# Patient Record
Sex: Female | Born: 1945 | Race: Black or African American | Hispanic: No | Marital: Married | Smoking: Never smoker
Health system: Southern US, Community
[De-identification: ages and names within clinical notes are randomized; demographics above are authoritative.]

## PROBLEM LIST (undated history)

## (undated) DIAGNOSIS — I639 Cerebral infarction, unspecified: Secondary | ICD-10-CM

## (undated) DIAGNOSIS — I4891 Unspecified atrial fibrillation: Secondary | ICD-10-CM

## (undated) DIAGNOSIS — I120 Hypertensive chronic kidney disease with stage 5 chronic kidney disease or end stage renal disease: Secondary | ICD-10-CM

## (undated) DIAGNOSIS — Z95828 Presence of other vascular implants and grafts: Secondary | ICD-10-CM

## (undated) DIAGNOSIS — I429 Cardiomyopathy, unspecified: Secondary | ICD-10-CM

## (undated) DIAGNOSIS — Z8619 Personal history of other infectious and parasitic diseases: Secondary | ICD-10-CM

## (undated) DIAGNOSIS — R34 Anuria and oliguria: Secondary | ICD-10-CM

## (undated) DIAGNOSIS — R809 Proteinuria, unspecified: Secondary | ICD-10-CM

## (undated) DIAGNOSIS — A419 Sepsis, unspecified organism: Secondary | ICD-10-CM

## (undated) DIAGNOSIS — I629 Nontraumatic intracranial hemorrhage, unspecified: Secondary | ICD-10-CM

## (undated) DIAGNOSIS — D899 Disorder involving the immune mechanism, unspecified: Secondary | ICD-10-CM

## (undated) DIAGNOSIS — E213 Hyperparathyroidism, unspecified: Secondary | ICD-10-CM

## (undated) DIAGNOSIS — G4733 Obstructive sleep apnea (adult) (pediatric): Secondary | ICD-10-CM

## (undated) DIAGNOSIS — F329 Major depressive disorder, single episode, unspecified: Secondary | ICD-10-CM

## (undated) DIAGNOSIS — M1990 Unspecified osteoarthritis, unspecified site: Secondary | ICD-10-CM

## (undated) DIAGNOSIS — I619 Nontraumatic intracerebral hemorrhage, unspecified: Secondary | ICD-10-CM

## (undated) DIAGNOSIS — I82403 Acute embolism and thrombosis of unspecified deep veins of lower extremity, bilateral: Secondary | ICD-10-CM

## (undated) DIAGNOSIS — Z96641 Presence of right artificial hip joint: Secondary | ICD-10-CM

## (undated) DIAGNOSIS — R601 Generalized edema: Secondary | ICD-10-CM

## (undated) DIAGNOSIS — S52501A Unspecified fracture of the lower end of right radius, initial encounter for closed fracture: Secondary | ICD-10-CM

## (undated) DIAGNOSIS — B259 Cytomegaloviral disease, unspecified: Secondary | ICD-10-CM

## (undated) DIAGNOSIS — L309 Dermatitis, unspecified: Secondary | ICD-10-CM

## (undated) DIAGNOSIS — N186 End stage renal disease: Secondary | ICD-10-CM

## (undated) DIAGNOSIS — L409 Psoriasis, unspecified: Secondary | ICD-10-CM

## (undated) DIAGNOSIS — I480 Paroxysmal atrial fibrillation: Secondary | ICD-10-CM

## (undated) DIAGNOSIS — D649 Anemia, unspecified: Secondary | ICD-10-CM

## (undated) DIAGNOSIS — I420 Dilated cardiomyopathy: Secondary | ICD-10-CM

## (undated) DIAGNOSIS — Z8701 Personal history of pneumonia (recurrent): Secondary | ICD-10-CM

## (undated) DIAGNOSIS — I4892 Unspecified atrial flutter: Secondary | ICD-10-CM

## (undated) DIAGNOSIS — I5031 Acute diastolic (congestive) heart failure: Secondary | ICD-10-CM

## (undated) DIAGNOSIS — M8430XA Stress fracture, unspecified site, initial encounter for fracture: Secondary | ICD-10-CM

## (undated) DIAGNOSIS — Z940 Kidney transplant status: Secondary | ICD-10-CM

## (undated) DIAGNOSIS — J189 Pneumonia, unspecified organism: Secondary | ICD-10-CM

## (undated) DIAGNOSIS — IMO0001 Reserved for inherently not codable concepts without codable children: Secondary | ICD-10-CM

## (undated) DIAGNOSIS — R413 Other amnesia: Secondary | ICD-10-CM

## (undated) DIAGNOSIS — H9190 Unspecified hearing loss, unspecified ear: Secondary | ICD-10-CM

## (undated) DIAGNOSIS — Z992 Dependence on renal dialysis: Secondary | ICD-10-CM

## (undated) DIAGNOSIS — Z9289 Personal history of other medical treatment: Secondary | ICD-10-CM

## (undated) DIAGNOSIS — I509 Heart failure, unspecified: Secondary | ICD-10-CM

## (undated) DIAGNOSIS — K5900 Constipation, unspecified: Secondary | ICD-10-CM

## (undated) DIAGNOSIS — T8189XA Other complications of procedures, not elsewhere classified, initial encounter: Secondary | ICD-10-CM

## (undated) DIAGNOSIS — I739 Peripheral vascular disease, unspecified: Secondary | ICD-10-CM

## (undated) DIAGNOSIS — Z9989 Dependence on other enabling machines and devices: Secondary | ICD-10-CM

## (undated) DIAGNOSIS — J45909 Unspecified asthma, uncomplicated: Secondary | ICD-10-CM

## (undated) DIAGNOSIS — F419 Anxiety disorder, unspecified: Secondary | ICD-10-CM

## (undated) DIAGNOSIS — Z8673 Personal history of transient ischemic attack (TIA), and cerebral infarction without residual deficits: Secondary | ICD-10-CM

## (undated) DIAGNOSIS — E7800 Pure hypercholesterolemia, unspecified: Secondary | ICD-10-CM

## (undated) DIAGNOSIS — E669 Obesity, unspecified: Secondary | ICD-10-CM

## (undated) DIAGNOSIS — F32A Depression, unspecified: Secondary | ICD-10-CM

## (undated) DIAGNOSIS — I10 Essential (primary) hypertension: Secondary | ICD-10-CM

## (undated) DIAGNOSIS — Z95818 Presence of other cardiac implants and grafts: Secondary | ICD-10-CM

## (undated) DIAGNOSIS — N179 Acute kidney failure, unspecified: Secondary | ICD-10-CM

## (undated) DIAGNOSIS — Z5189 Encounter for other specified aftercare: Secondary | ICD-10-CM

## (undated) HISTORY — DX: Unspecified atrial flutter: I48.92

## (undated) HISTORY — DX: Essential (primary) hypertension: I10

## (undated) HISTORY — DX: Stress fracture, unspecified site, initial encounter for fracture: M84.30XA

## (undated) HISTORY — DX: Proteinuria, unspecified: R80.9

## (undated) HISTORY — DX: Acute kidney failure, unspecified: N17.9

## (undated) HISTORY — DX: Nontraumatic intracranial hemorrhage, unspecified: I62.9

## (undated) HISTORY — DX: Acute diastolic (congestive) heart failure: I50.31

## (undated) HISTORY — DX: Cardiomyopathy, unspecified: I42.9

## (undated) HISTORY — PX: CARDIAC CATHETERIZATION: SHX172

## (undated) HISTORY — DX: Other complications of procedures, not elsewhere classified, initial encounter: T81.89XA

## (undated) HISTORY — DX: Presence of other cardiac implants and grafts: Z95.818

## (undated) HISTORY — DX: Other amnesia: R41.3

## (undated) HISTORY — PX: JOINT REPLACEMENT: SHX530

## (undated) HISTORY — DX: Dilated cardiomyopathy: I42.0

## (undated) HISTORY — DX: Unspecified atrial fibrillation: I48.91

## (undated) HISTORY — DX: Kidney transplant status: Z94.0

## (undated) HISTORY — DX: Hyperparathyroidism, unspecified: E21.3

## (undated) HISTORY — DX: Sepsis, unspecified organism: A41.9

## (undated) HISTORY — DX: Psoriasis, unspecified: L40.9

## (undated) HISTORY — DX: Major depressive disorder, single episode, unspecified: F32.9

## (undated) HISTORY — DX: Cytomegaloviral disease, unspecified: B25.9

## (undated) HISTORY — DX: End stage renal disease: N18.6

## (undated) HISTORY — PX: FEMUR FRACTURE SURGERY: SHX633

## (undated) HISTORY — DX: Personal history of other infectious and parasitic diseases: Z86.19

## (undated) HISTORY — DX: Morbid (severe) obesity due to excess calories: E66.01

## (undated) HISTORY — DX: Obesity, unspecified: E66.9

## (undated) HISTORY — DX: Presence of right artificial hip joint: Z96.641

## (undated) HISTORY — PX: FRACTURE SURGERY: SHX138

## (undated) HISTORY — DX: Presence of other vascular implants and grafts: Z95.828

## (undated) HISTORY — DX: Hypertensive chronic kidney disease with stage 5 chronic kidney disease or end stage renal disease: I12.0

## (undated) HISTORY — PX: CHOLECYSTECTOMY: SHX55

## (undated) HISTORY — DX: Personal history of pneumonia (recurrent): Z87.01

## (undated) HISTORY — DX: Unspecified asthma, uncomplicated: J45.909

## (undated) HISTORY — DX: Personal history of transient ischemic attack (TIA), and cerebral infarction without residual deficits: Z86.73

## (undated) HISTORY — DX: Unspecified hearing loss, unspecified ear: H91.90

## (undated) HISTORY — PX: CYSTOSCOPY: SUR368

## (undated) HISTORY — PX: TUBAL LIGATION: SHX77

## (undated) HISTORY — DX: Other disorders of phosphorus metabolism: E83.39

## (undated) HISTORY — DX: Paroxysmal atrial fibrillation: I48.0

## (undated) HISTORY — PX: COLONOSCOPY: SHX174

## (undated) HISTORY — DX: Generalized edema: R60.1

## (undated) HISTORY — DX: Personal history of other medical treatment: Z92.89

## (undated) HISTORY — DX: Dependence on other enabling machines and devices: Z99.89

## (undated) HISTORY — PX: INSERTION OF DIALYSIS CATHETER: SHX1324

## (undated) HISTORY — DX: Disorder involving the immune mechanism, unspecified: D89.9

## (undated) HISTORY — DX: Hypomagnesemia: E83.42

## (undated) HISTORY — DX: Depression, unspecified: F32.A

---
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
## 1998-04-18 ENCOUNTER — Ambulatory Visit (HOSPITAL_COMMUNITY): Admission: RE | Admit: 1998-04-18 | Discharge: 1998-04-18 | Payer: Self-pay | Admitting: Obstetrics and Gynecology

## 1999-04-20 ENCOUNTER — Encounter: Payer: Self-pay | Admitting: Family Medicine

## 1999-04-20 ENCOUNTER — Ambulatory Visit (HOSPITAL_COMMUNITY): Admission: RE | Admit: 1999-04-20 | Discharge: 1999-04-20 | Payer: Self-pay | Admitting: Family Medicine

## 2000-04-23 ENCOUNTER — Encounter: Payer: Self-pay | Admitting: Family Medicine

## 2000-04-23 ENCOUNTER — Ambulatory Visit (HOSPITAL_COMMUNITY): Admission: RE | Admit: 2000-04-23 | Discharge: 2000-04-23 | Payer: Self-pay | Admitting: Family Medicine

## 2001-05-07 ENCOUNTER — Ambulatory Visit (HOSPITAL_COMMUNITY): Admission: RE | Admit: 2001-05-07 | Discharge: 2001-05-07 | Payer: Self-pay | Admitting: Family Medicine

## 2001-05-07 ENCOUNTER — Encounter: Payer: Self-pay | Admitting: Family Medicine

## 2002-05-08 ENCOUNTER — Ambulatory Visit (HOSPITAL_COMMUNITY): Admission: RE | Admit: 2002-05-08 | Discharge: 2002-05-08 | Payer: Self-pay | Admitting: Obstetrics and Gynecology

## 2002-05-08 ENCOUNTER — Encounter: Payer: Self-pay | Admitting: Obstetrics and Gynecology

## 2002-09-10 ENCOUNTER — Encounter (INDEPENDENT_AMBULATORY_CARE_PROVIDER_SITE_OTHER): Payer: Self-pay | Admitting: Specialist

## 2002-09-10 ENCOUNTER — Ambulatory Visit (HOSPITAL_BASED_OUTPATIENT_CLINIC_OR_DEPARTMENT_OTHER): Admission: RE | Admit: 2002-09-10 | Discharge: 2002-09-10 | Payer: Self-pay | Admitting: Plastic Surgery

## 2003-07-17 DIAGNOSIS — I509 Heart failure, unspecified: Secondary | ICD-10-CM

## 2003-07-17 HISTORY — DX: Heart failure, unspecified: I50.9

## 2003-09-15 ENCOUNTER — Ambulatory Visit (HOSPITAL_COMMUNITY): Admission: RE | Admit: 2003-09-15 | Discharge: 2003-09-15 | Payer: Self-pay | Admitting: Obstetrics and Gynecology

## 2003-11-08 ENCOUNTER — Other Ambulatory Visit: Admission: RE | Admit: 2003-11-08 | Discharge: 2003-11-08 | Payer: Self-pay | Admitting: Obstetrics and Gynecology

## 2004-10-26 ENCOUNTER — Ambulatory Visit (HOSPITAL_COMMUNITY): Admission: RE | Admit: 2004-10-26 | Discharge: 2004-10-26 | Payer: Self-pay | Admitting: Family Medicine

## 2004-12-18 ENCOUNTER — Encounter: Admission: RE | Admit: 2004-12-18 | Discharge: 2004-12-18 | Payer: Self-pay | Admitting: Cardiology

## 2004-12-18 IMAGING — CR DG CHEST 2V
2 series · 2 of 2 positions shown · non-contrast
Comparison: None.

CLINICAL DATA: Shortness of breath.  Preprocedure chest x-ray.
 TWO VIEW CHEST ? [DATE]:

[w chest pa]
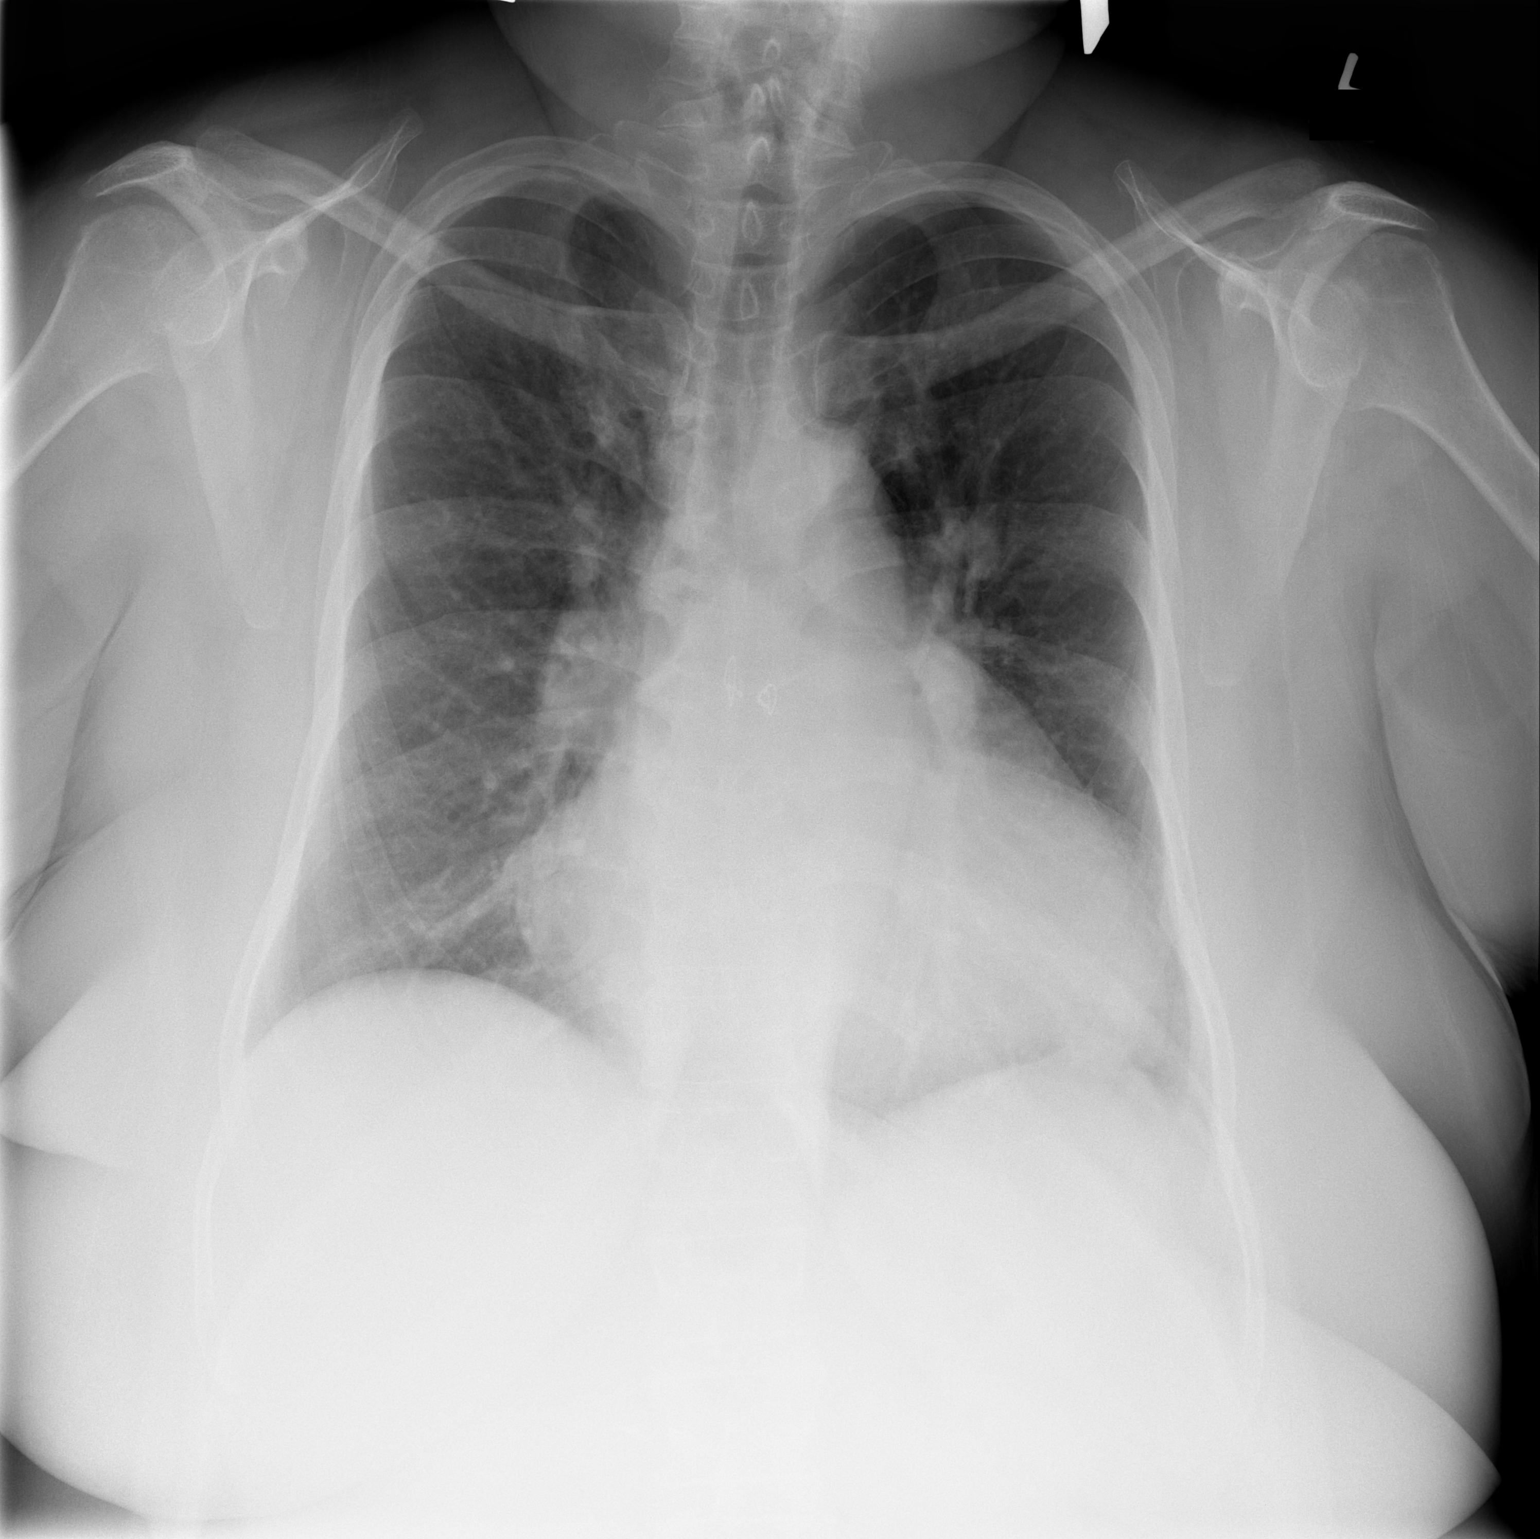

[w chest lat]
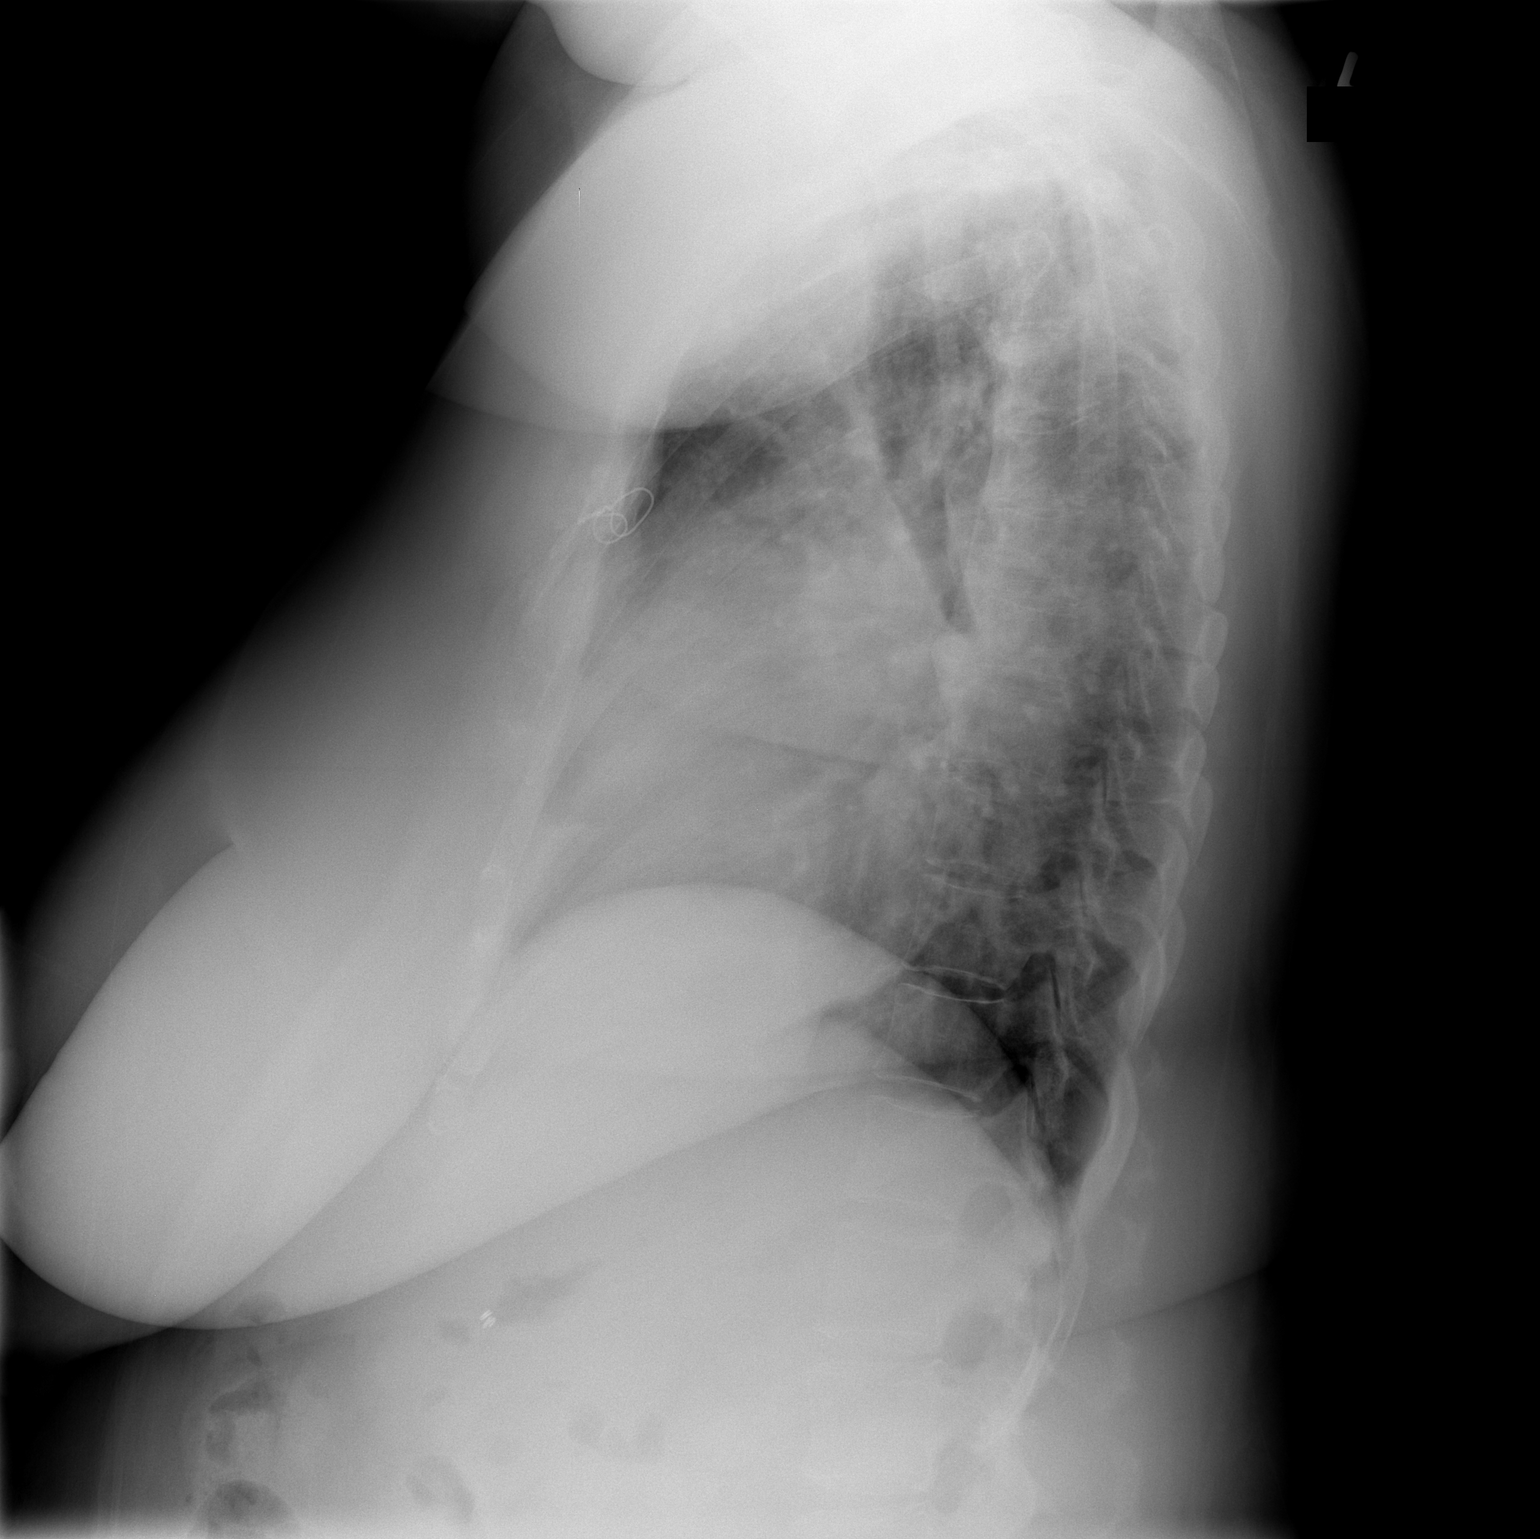

[2 of 2 positions shown; findings below may reference images not displayed]

The cardiopericardial silhouette is enlarged with associated pulmonary vascular congestion.  Minimal basilar atelectasis noted bilaterally.  No pleural effusion.  Bony structures of the imaged thorax are intact with two cerclage wires projecting over the sternum.
IMPRESSION: Cardiomegaly with pulmonary vascular congestion.

## 2004-12-21 ENCOUNTER — Ambulatory Visit (HOSPITAL_COMMUNITY): Admission: RE | Admit: 2004-12-21 | Discharge: 2004-12-21 | Payer: Self-pay | Admitting: Cardiology

## 2004-12-28 ENCOUNTER — Ambulatory Visit: Payer: Self-pay | Admitting: Pulmonary Disease

## 2005-04-03 ENCOUNTER — Ambulatory Visit: Payer: Self-pay | Admitting: Pulmonary Disease

## 2005-06-21 ENCOUNTER — Encounter: Admission: RE | Admit: 2005-06-21 | Discharge: 2005-06-21 | Payer: Self-pay | Admitting: Cardiology

## 2005-09-11 ENCOUNTER — Ambulatory Visit: Payer: Self-pay | Admitting: Pulmonary Disease

## 2005-10-26 ENCOUNTER — Ambulatory Visit: Payer: Self-pay | Admitting: Pulmonary Disease

## 2005-11-06 ENCOUNTER — Ambulatory Visit (HOSPITAL_COMMUNITY): Admission: RE | Admit: 2005-11-06 | Discharge: 2005-11-06 | Payer: Self-pay | Admitting: Family Medicine

## 2006-04-15 ENCOUNTER — Encounter: Admission: RE | Admit: 2006-04-15 | Discharge: 2006-04-15 | Payer: Self-pay | Admitting: Family Medicine

## 2006-04-15 IMAGING — CR DG CHEST 2V
2 series · 2 of 2 positions shown · non-contrast
Comparison: [DATE].

CLINICAL DATA: Cough and wheezing for two days.  Nonsmoker.
TWO VIEW CHEST:

[w chest pa *]
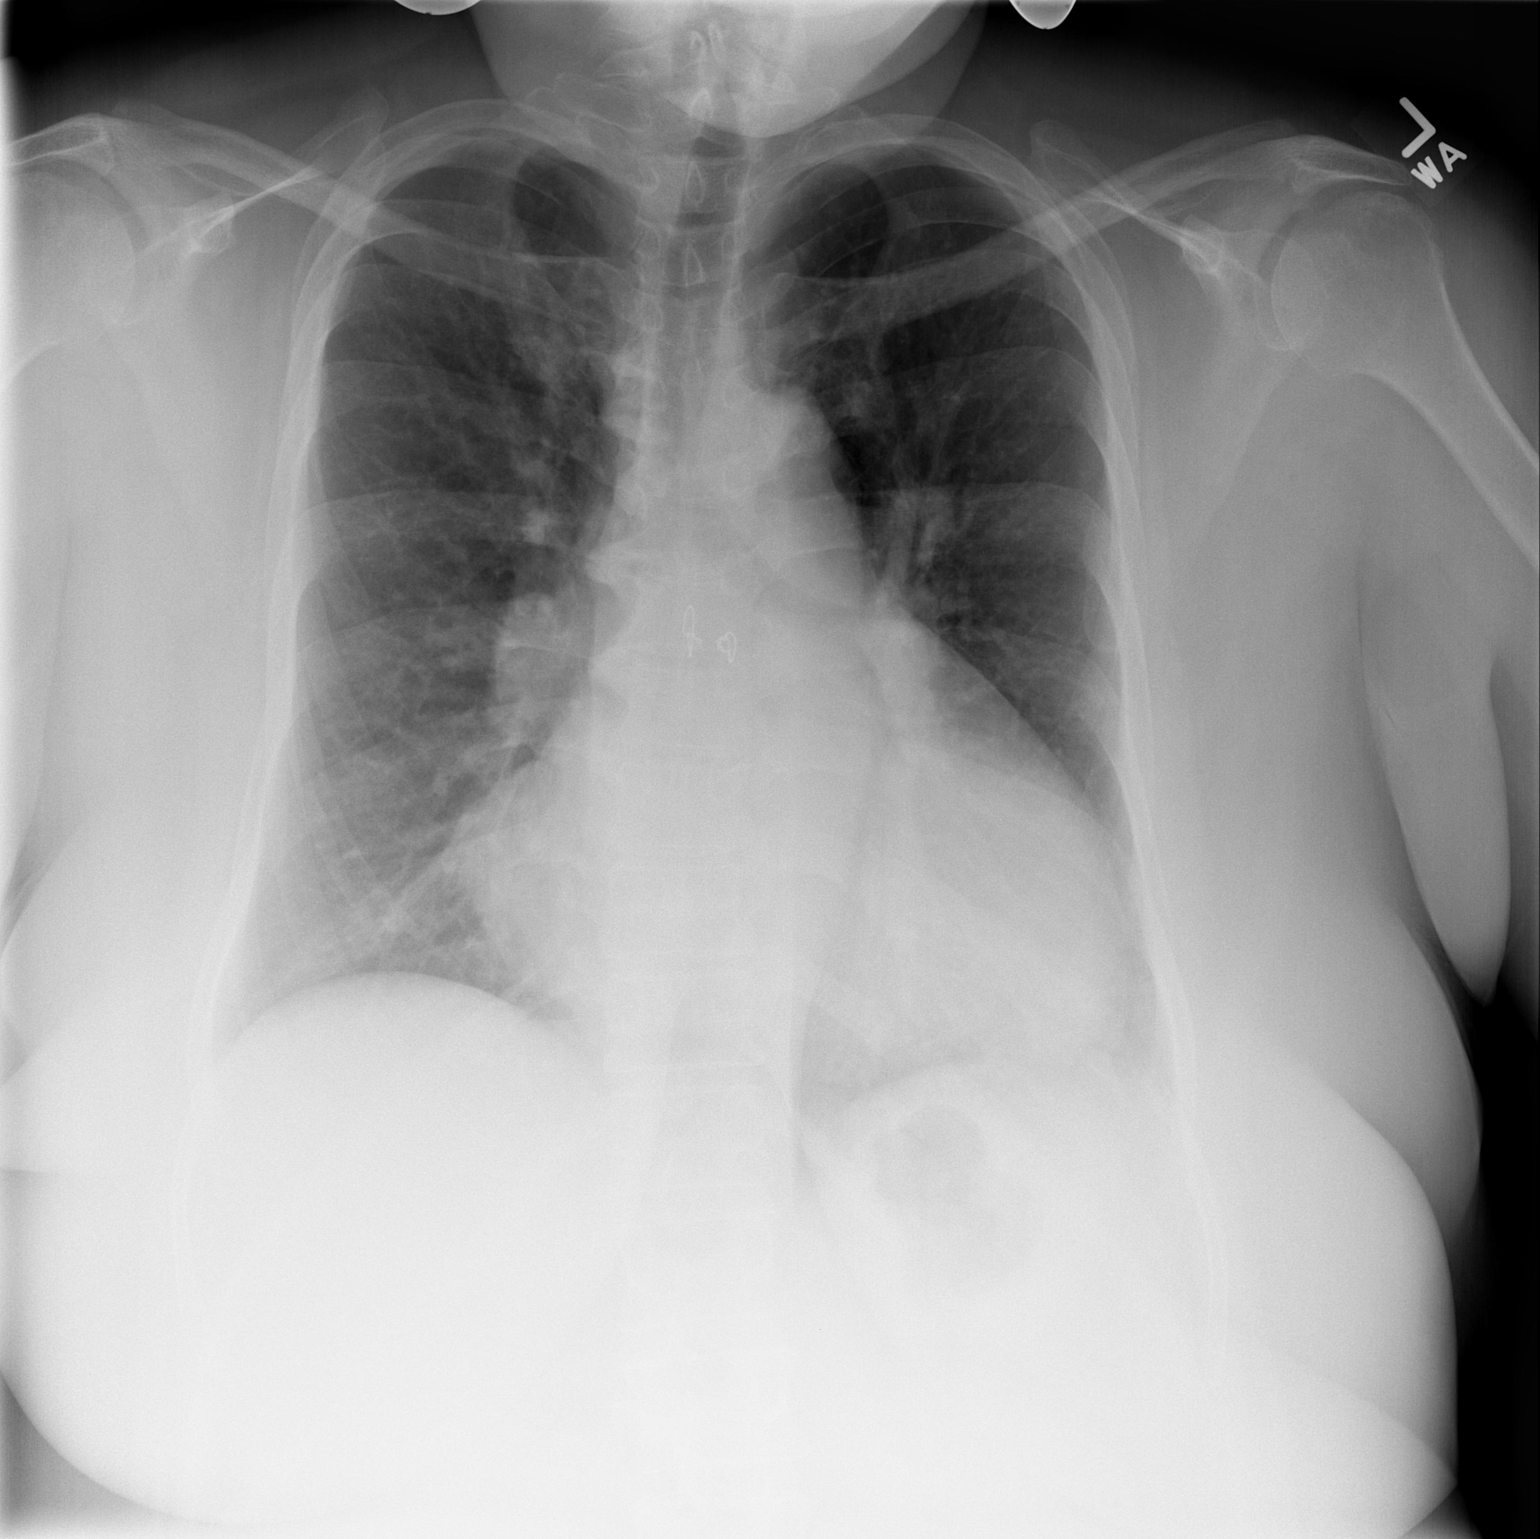

[w chest lat *]
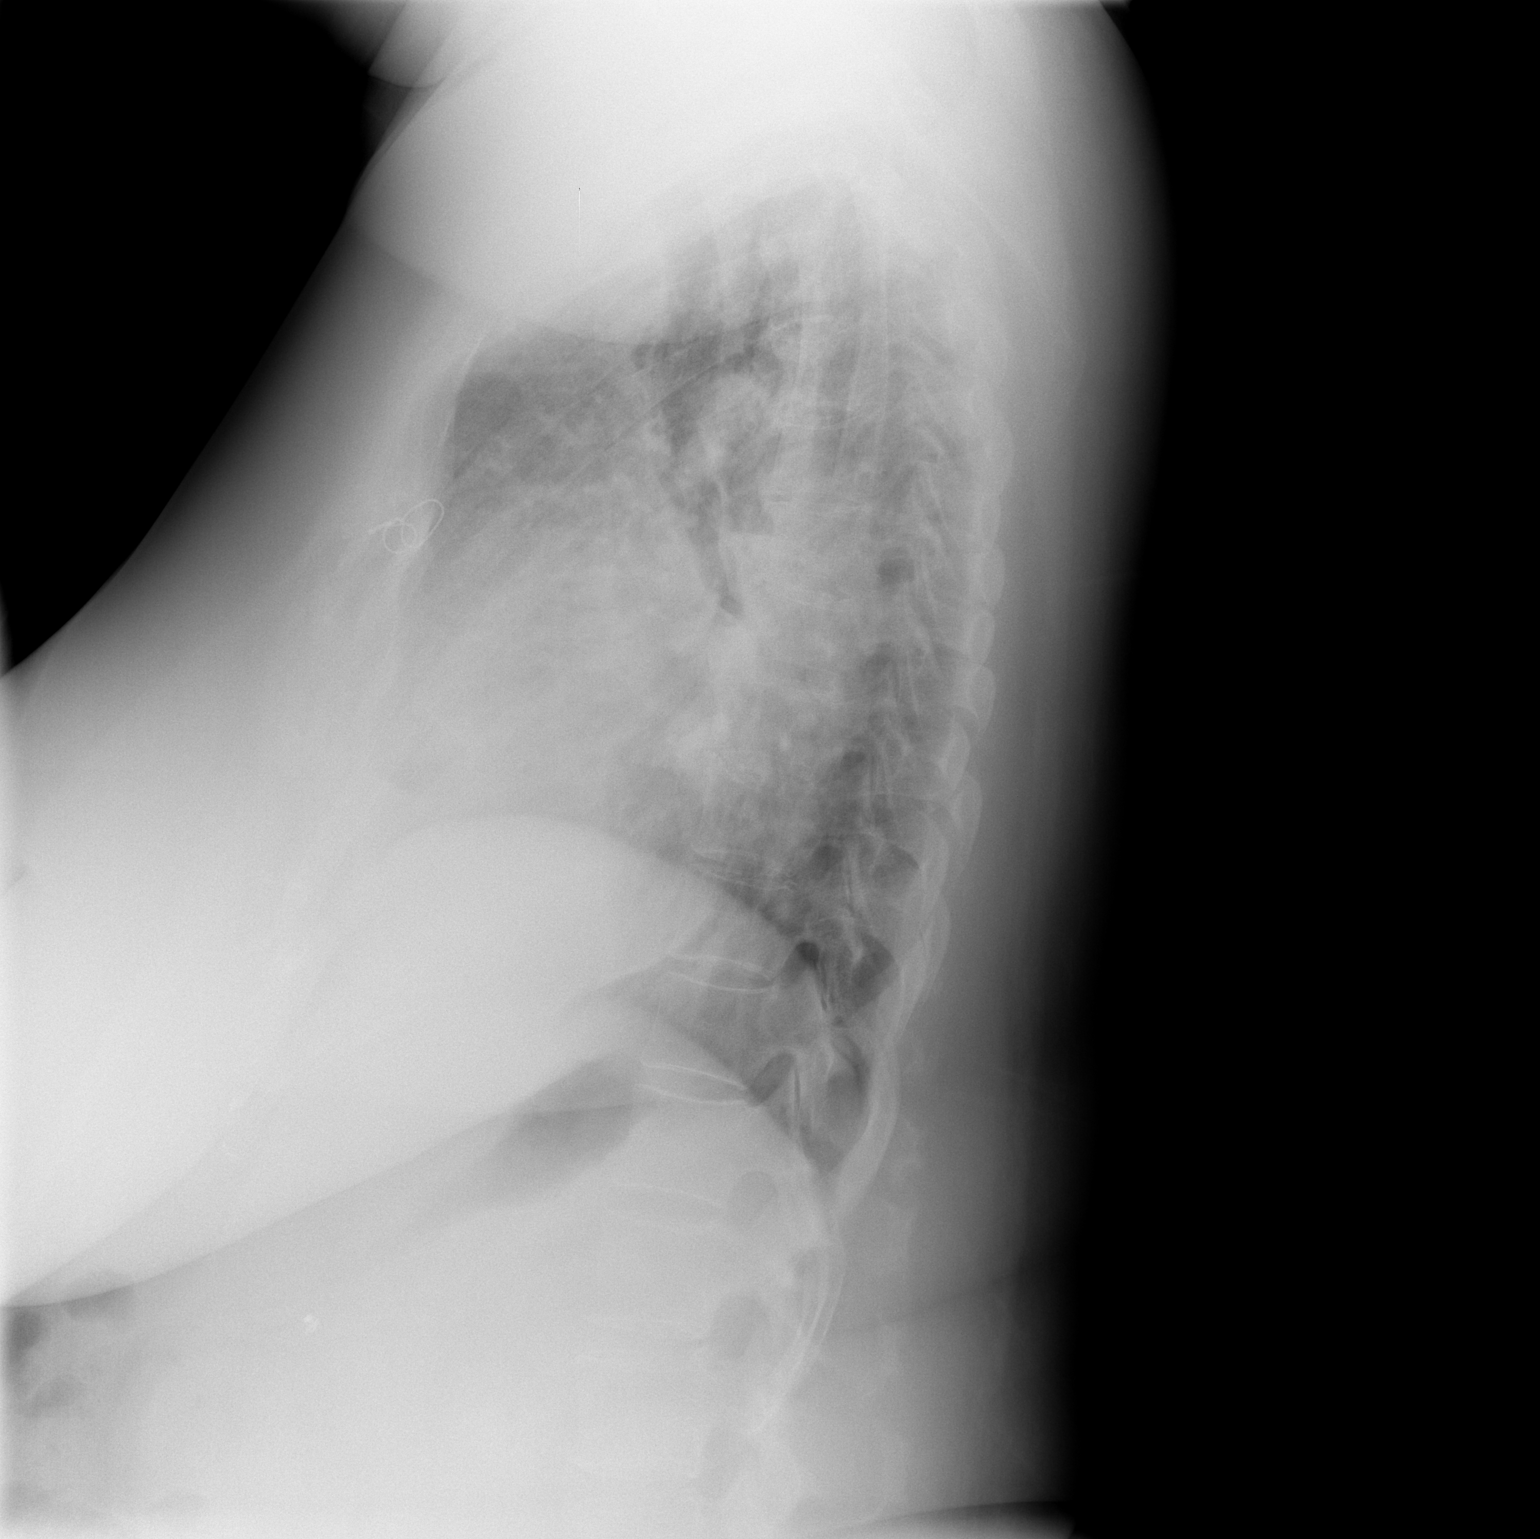

[2 of 2 positions shown; findings below may reference images not displayed]

FINDINGS: Midline trachea.  Global cardiomegaly.  Component of left atrial enlargement possibly right atrial enlargement.  Costophrenic angles are sharp.  There is mild pulmonary vascular congestion.  Poor delineation of the right heart border is similar to prior exam and likely within normal variation for this patient.
IMPRESSION: 1.  Similar global cardiomegaly with probable biatrial enlargement. 
2.  Mild pulmonary vascular congestion.

## 2006-06-25 ENCOUNTER — Ambulatory Visit: Payer: Self-pay | Admitting: Pulmonary Disease

## 2006-07-16 HISTORY — PX: LAPAROSCOPIC GASTRIC BANDING: SHX1100

## 2006-07-23 ENCOUNTER — Ambulatory Visit: Payer: Self-pay | Admitting: Critical Care Medicine

## 2006-07-23 ENCOUNTER — Inpatient Hospital Stay (HOSPITAL_COMMUNITY): Admission: EM | Admit: 2006-07-23 | Discharge: 2006-07-28 | Payer: Self-pay | Admitting: Emergency Medicine

## 2006-07-23 IMAGING — CR DG CHEST 1V PORT
1 series · 1 of 1 positions shown · non-contrast
Comparison: [DATE].

CLINICAL DATA: Irregular heartbeat. Shortness of breath. Hypertension.
 PORTABLE CHEST - 1 VIEW ([L1] hours):

[view not recorded]
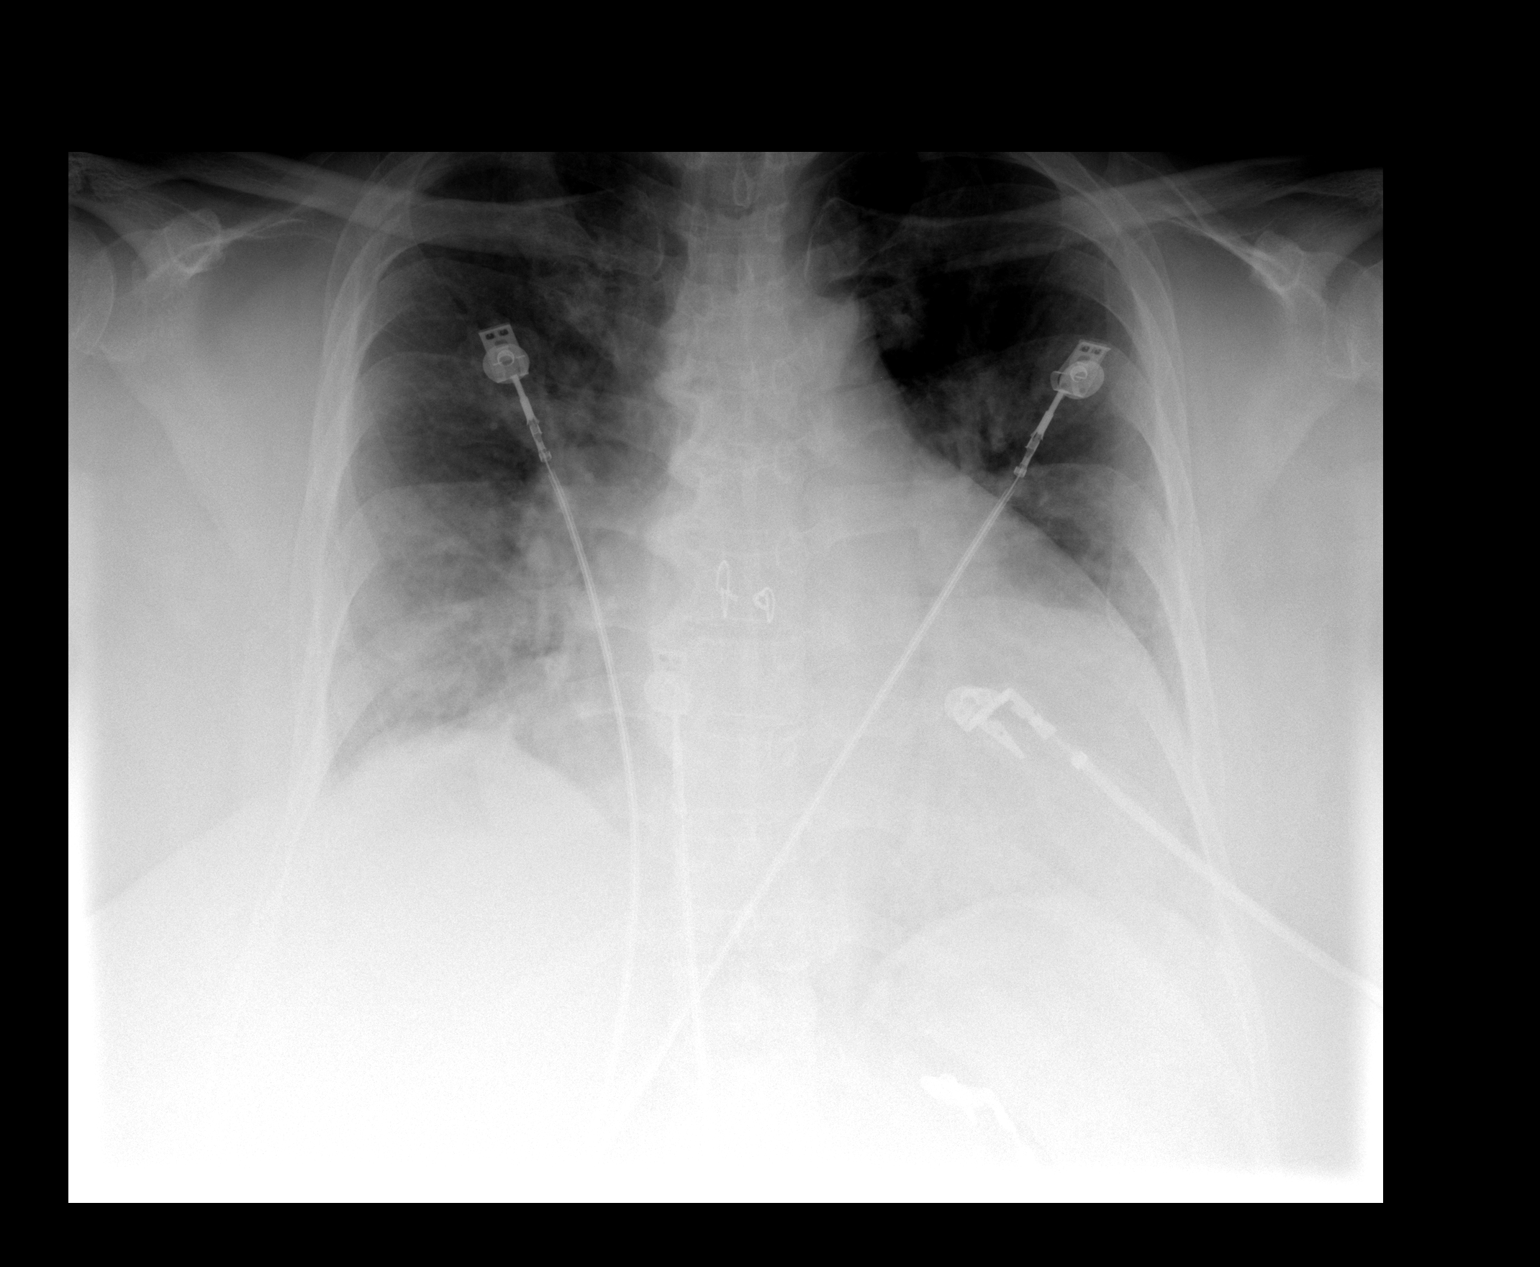

[1 of 1 positions shown; findings below may reference images not displayed]

FINDINGS: Cardiomegaly, vascular congestion and pulmonary edema.
IMPRESSION: CHF.

## 2006-07-23 IMAGING — CT CT ANGIO ABDOMEN
2 of 9 series · 14 of 46 positions shown, 19 images · IV contrast (APPLIED)
Comparison: none

CLINICAL DATA: Congestive heart failure.
 CT ANGIOGRAPHY OF ABDOMEN:
TECHNIQUE: Multidetector CT imaging of the abdomen was performed during bolus injection of intravenous contrast.  Multiplanar CT angiographic image reconstructions were generated to evaluate the vascular anatomy.
 Contrast:  100 cc Omnipaque 350.

[Series 5: abd cta 2.0 (id) · axial · 0.72mm/px · z∈[-256,-18]mm · 11 of 141 slices shown, 16 images]
[im 11/141  soft-tissue]
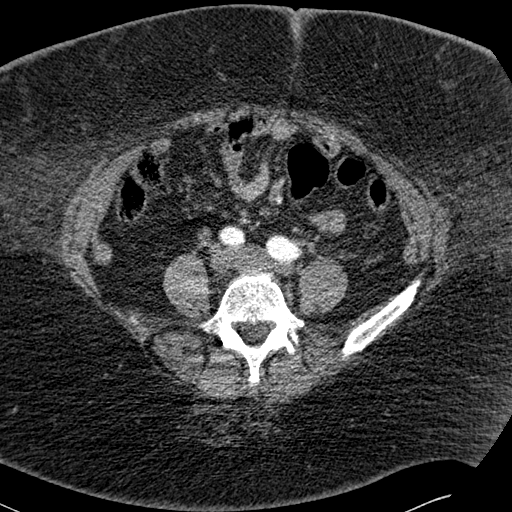
[im 11/141  bone]
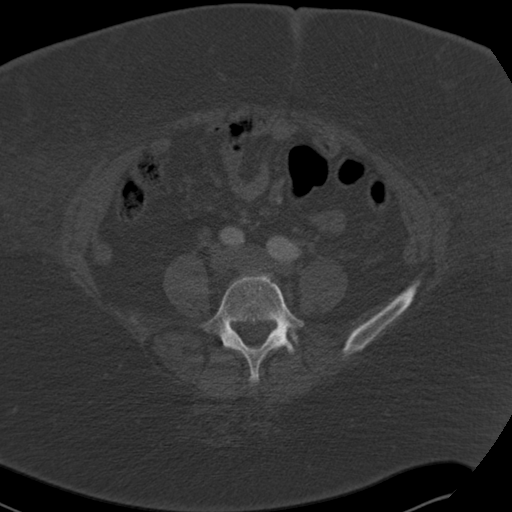
[im 22/141  soft-tissue]
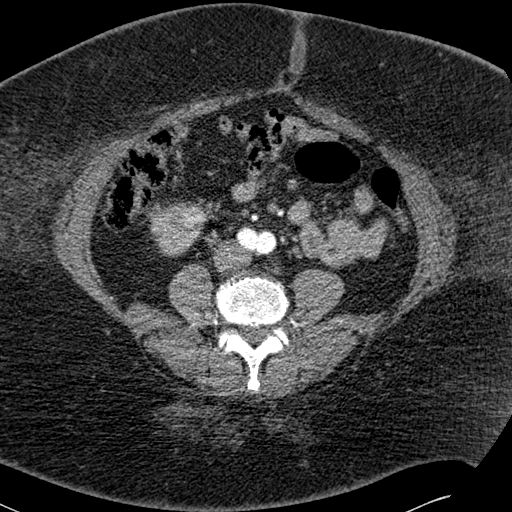
[im 44/141  soft-tissue]
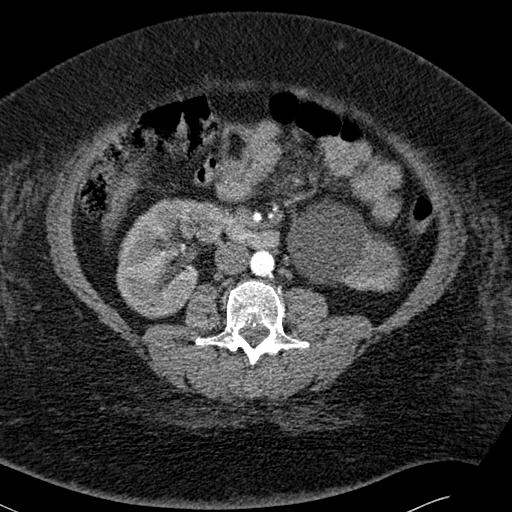
[im 54/141  soft-tissue]
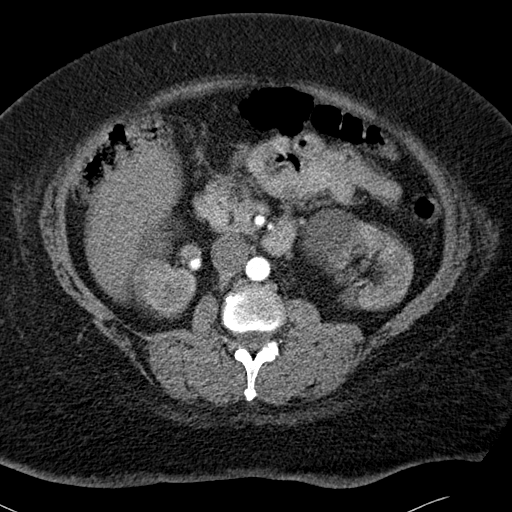
[im 65/141  soft-tissue]
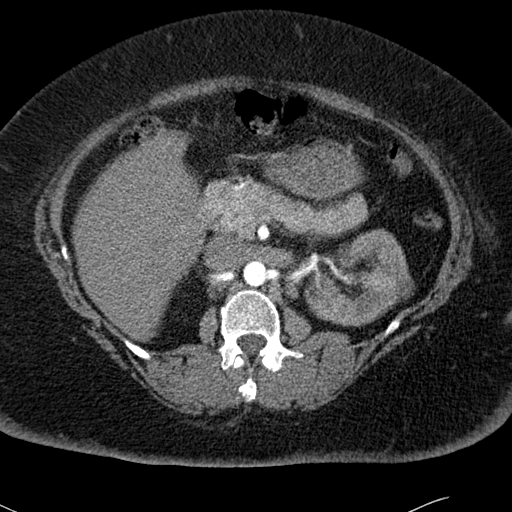
[im 76/141  soft-tissue]
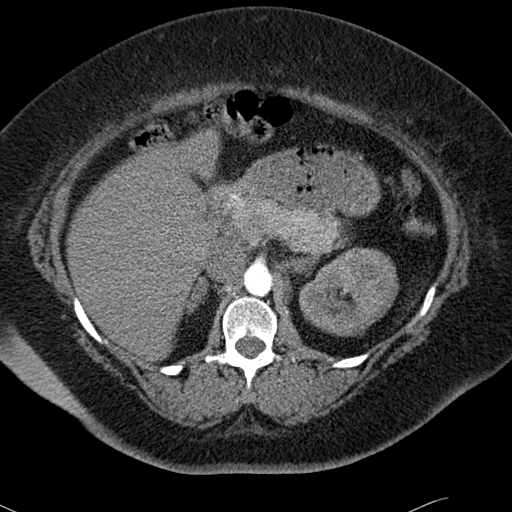
[im 87/141  soft-tissue]
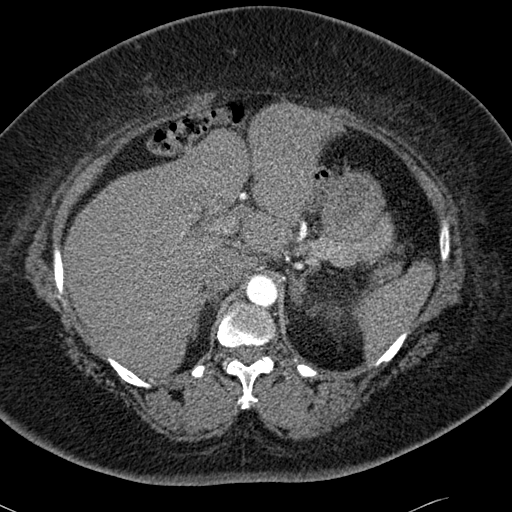
[im 97/141  lung]
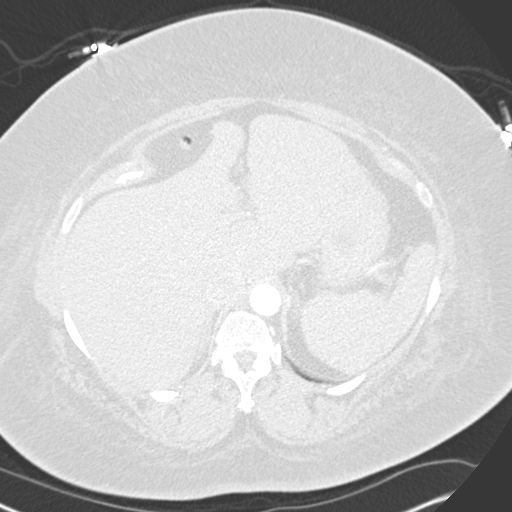
[im 108/141  soft-tissue]
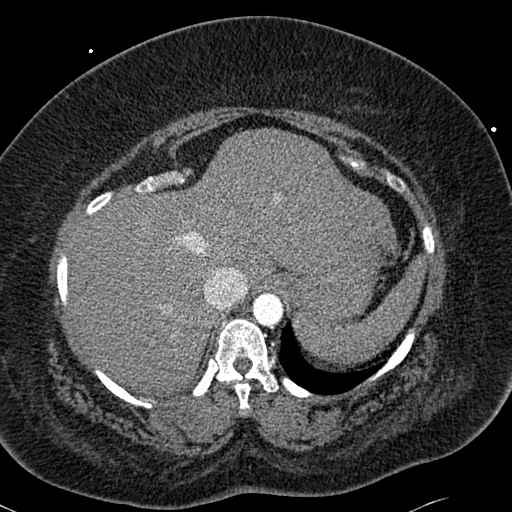
[im 108/141  lung]
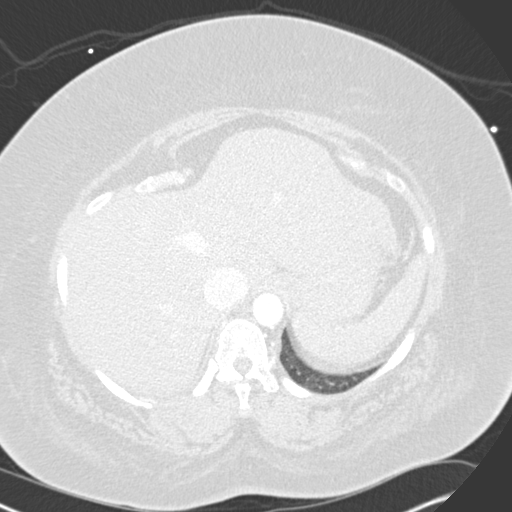
[im 119/141  soft-tissue]
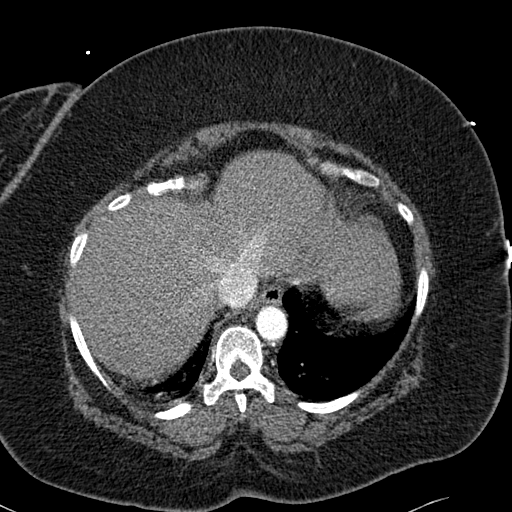
[im 119/141  lung]
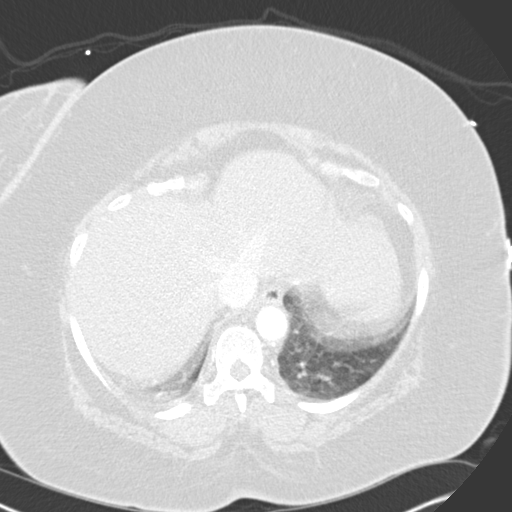
[im 119/141  bone]
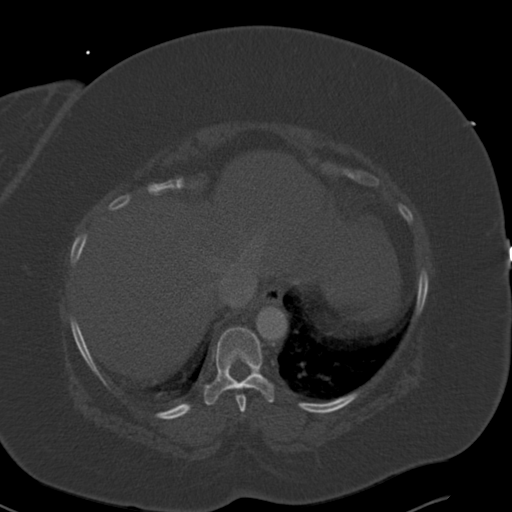
[im 130/141  soft-tissue]
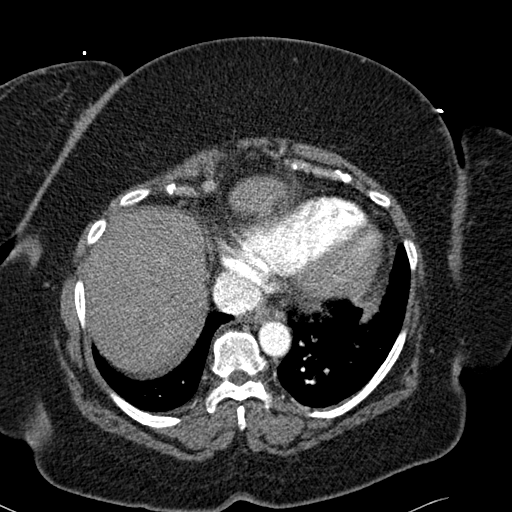
[im 130/141  lung]
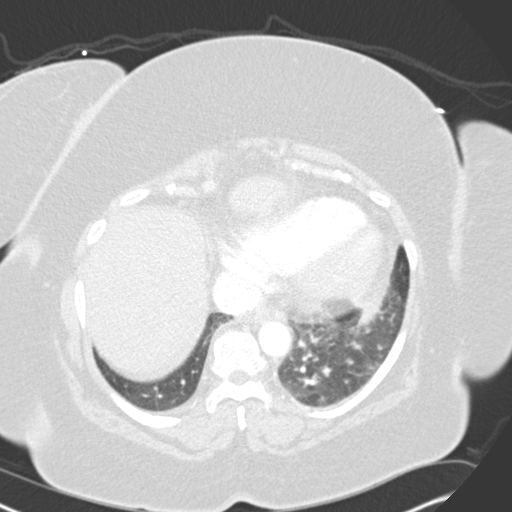

[Series 9: abd cta 2.0 spo corst · coronal · 0.72mm/px · 3 of 136 slices shown]
[im 34/136  soft-tissue]
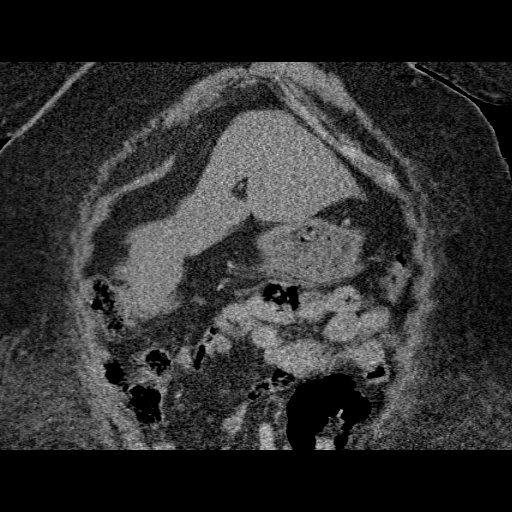
[im 68/136  soft-tissue]
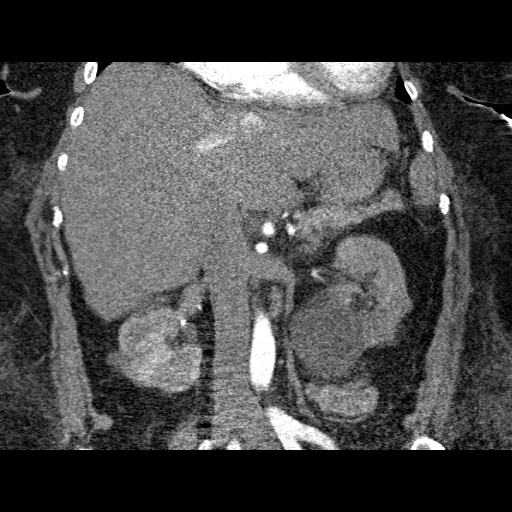
[im 102/136  soft-tissue]
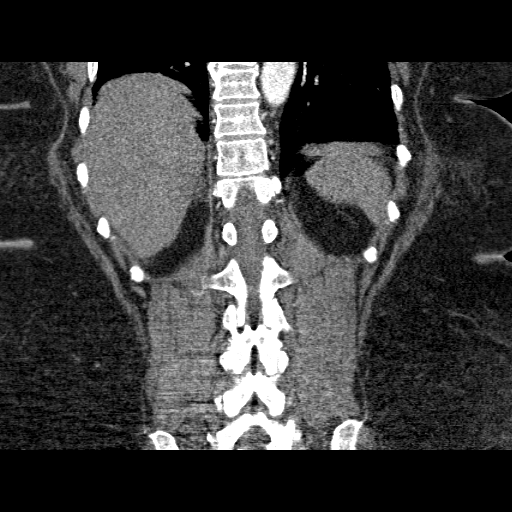

[14 of 46 positions shown; findings below may reference images not displayed]

FINDINGS: The patient is severely obese limiting the exam.  
 Single renal arteries are patent.  The SMA, celiac, and IMA are patent.  The proximal iliac arteries are patent.  The aorta is nonaneurysmal and patent.  
 Post cholecystectomy is noted.  Bibasilar atelectasis is present.  Small bilateral adrenal adenomas are present.  Multiple renal hypodensities are seen bilaterally.  A number of them are too small to characterize.  The larger ones are characterized as simple cysts.  
 The pancreas, and spleen are within normal limits.  The liver is lobulated with diffuse fatty infiltration.  There is no focal liver mass.  No abnormal adenopathy or free fluid is present.  Degenerative changes are present in the spine.  The heart is markedly enlarged.
IMPRESSION: 1.  Cardiomegaly.
 2.  No renal artery stenosis. 
 3.  Bilateral adrenal adenomas.
 4.  Renal hypodensities.  Ultrasound is warranted to characterize the small ones.

## 2006-07-24 ENCOUNTER — Encounter (INDEPENDENT_AMBULATORY_CARE_PROVIDER_SITE_OTHER): Payer: Self-pay | Admitting: Cardiology

## 2006-08-01 ENCOUNTER — Ambulatory Visit: Payer: Self-pay | Admitting: Pulmonary Disease

## 2006-08-16 ENCOUNTER — Ambulatory Visit (HOSPITAL_COMMUNITY): Admission: RE | Admit: 2006-08-16 | Discharge: 2006-08-16 | Payer: Self-pay | Admitting: Cardiology

## 2006-08-16 IMAGING — CR DG CHEST 2V
2 series · 2 of 2 positions shown · non-contrast
Comparison: Portable examination [DATE].

CLINICAL DATA: Dyspnea. Asthma and CHF. Pre-cardioversion for atrial fibrillation.
 CHEST - 2 VIEW:

[view not recorded (1 of 2)]
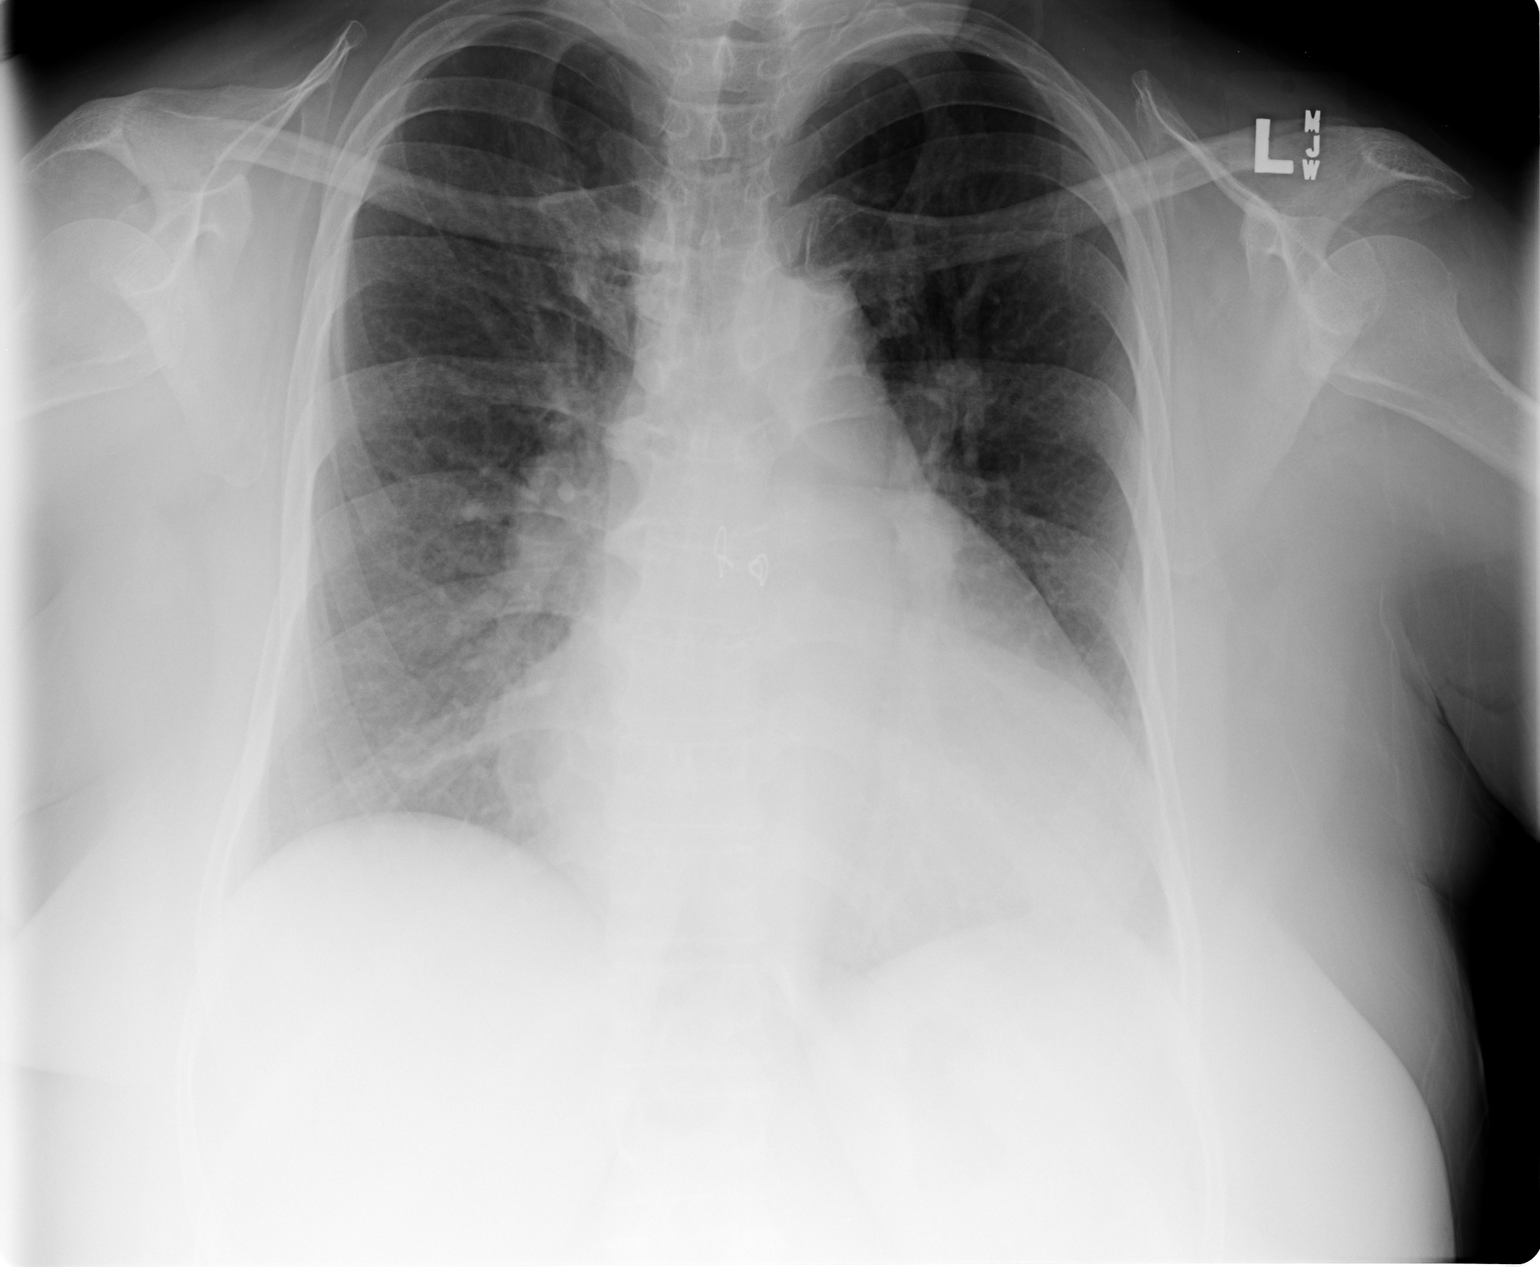

[view not recorded (2 of 2)]
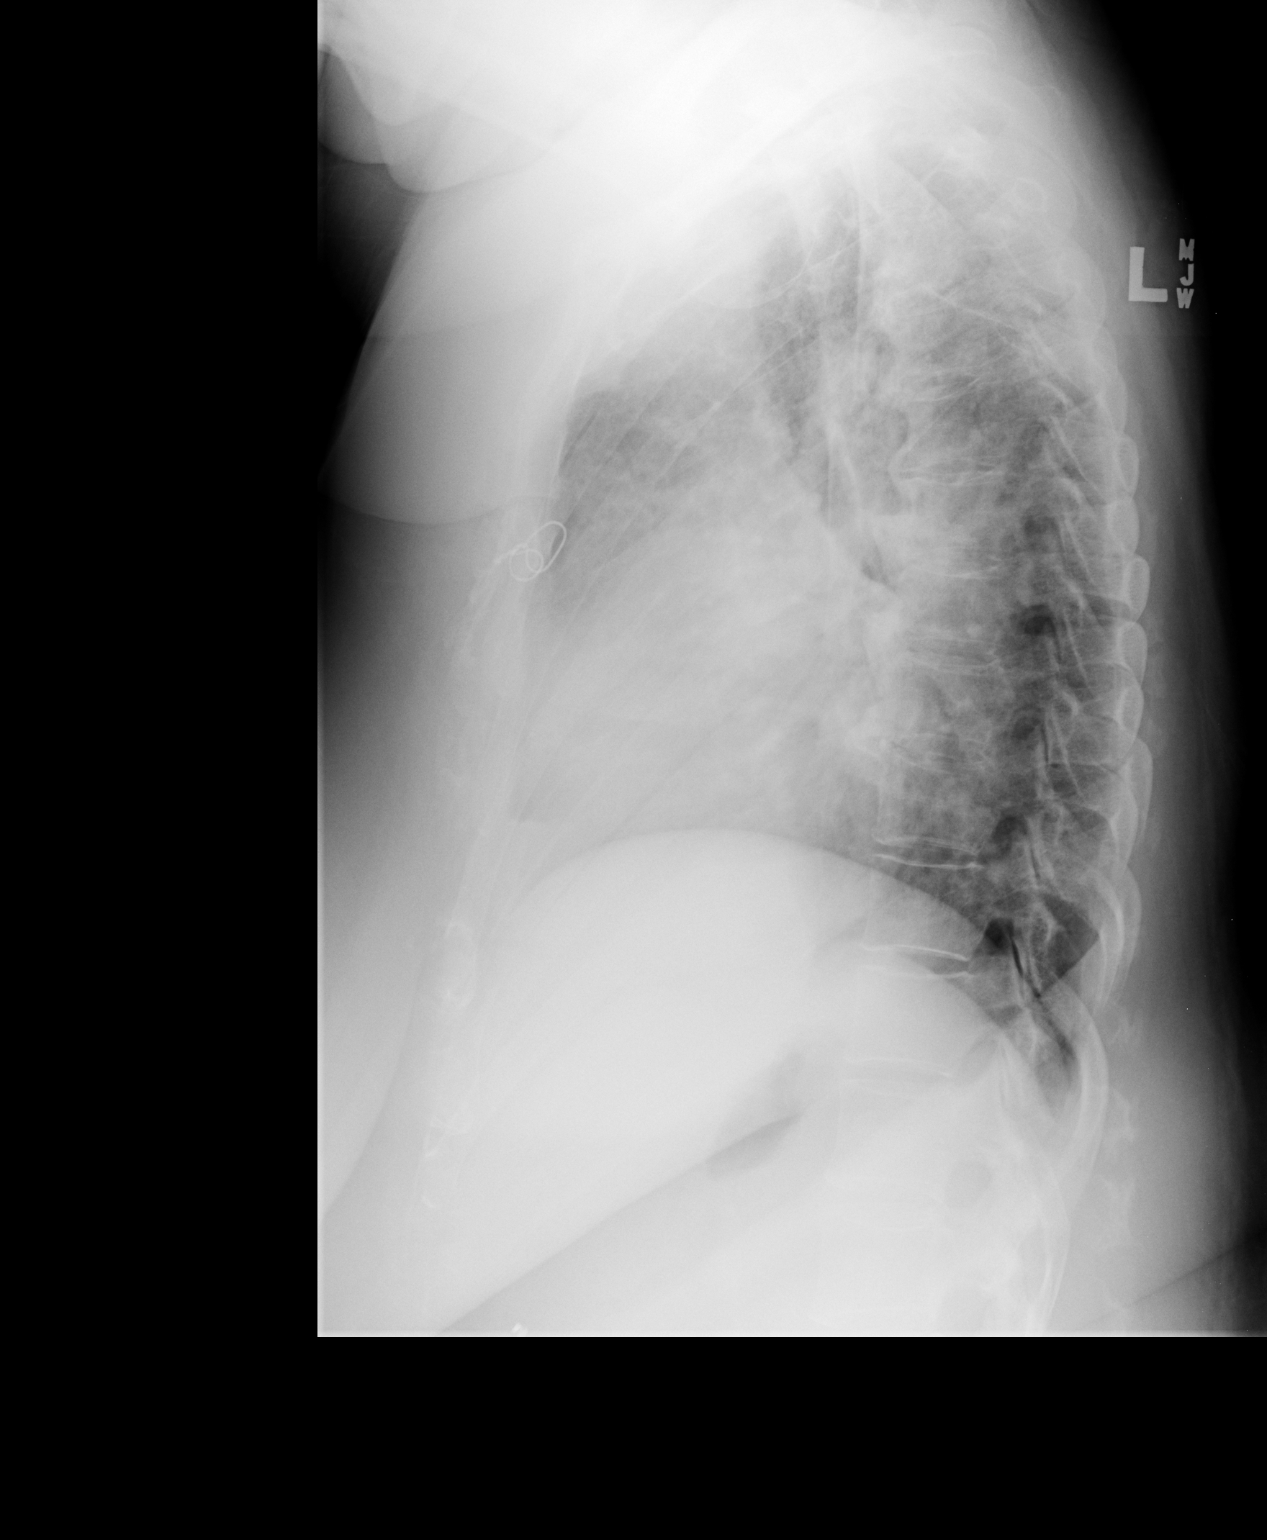

[2 of 2 positions shown; findings below may reference images not displayed]

FINDINGS: There is stable cardiomegaly and vascular congestion. The pulmonary edema has resolved and there is improved aeration of the lung bases. There is no pleural effusion or pneumothorax. Median sternotomy changes are noted.
IMPRESSION: Interval resolution of edema and bibasilar atelectasis. Cardiomegaly and vascular congestion remain.

## 2006-09-03 ENCOUNTER — Encounter: Admission: RE | Admit: 2006-09-03 | Discharge: 2006-09-03 | Payer: Self-pay | Admitting: Nephrology

## 2006-09-03 IMAGING — US US RENAL
1 series · 14 of 25 positions shown · non-contrast
Comparison: none

CLINICAL DATA: Elevated creatinine.  Evaluate kidney size and possible obstruction.
 RENAL/URINARY TRACT ULTRASOUND:
TECHNIQUE: Complete ultrasound examination of the urinary tract was performed including evaluation of the kidneys, renal collecting systems, and urinary bladder.

[Series 1: unknown · 0.28mm/px · 14 of 46 slices shown]
[im 1/46]
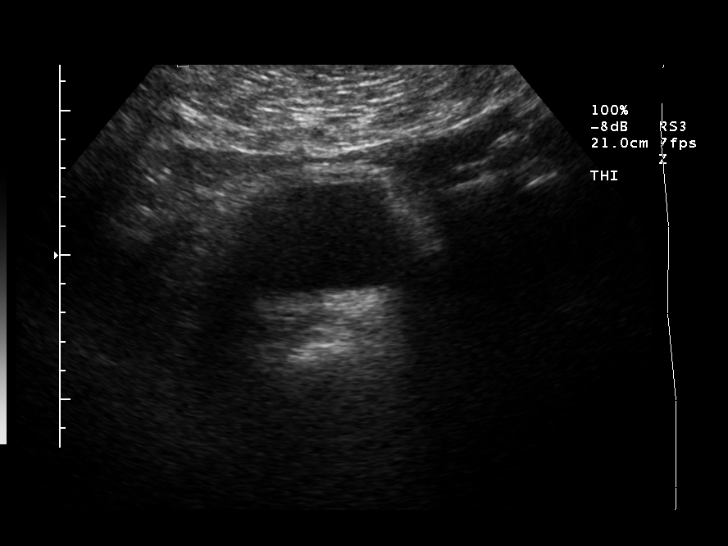
[im 4/46]
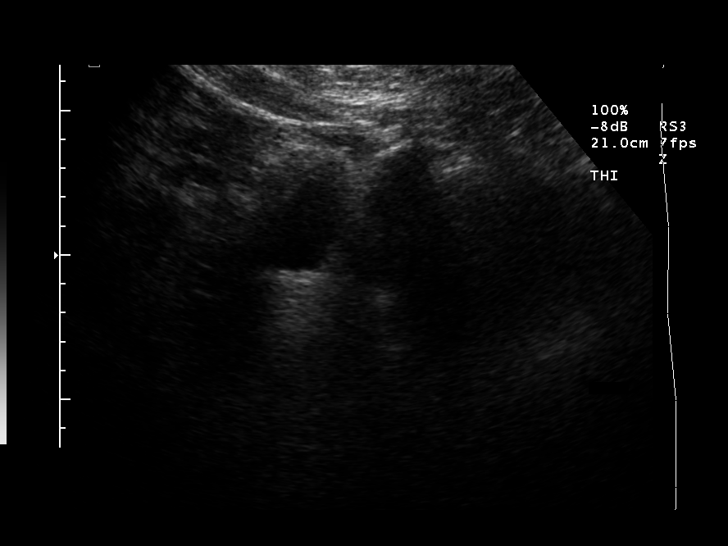
[im 8/46]
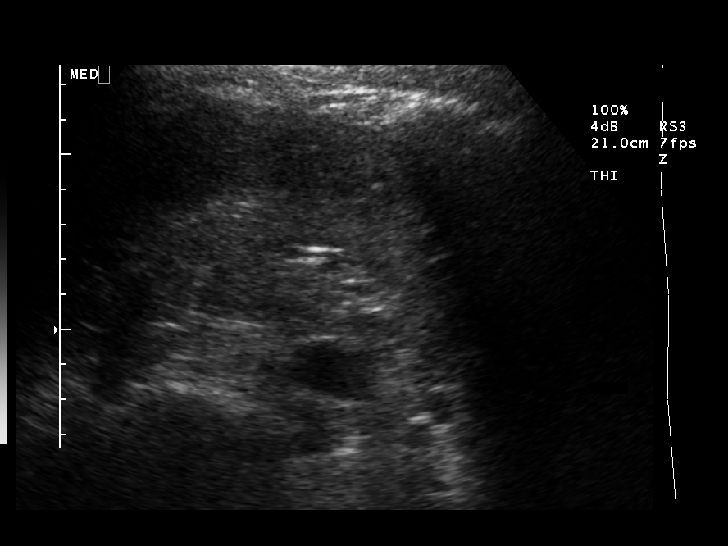
[im 12/46]
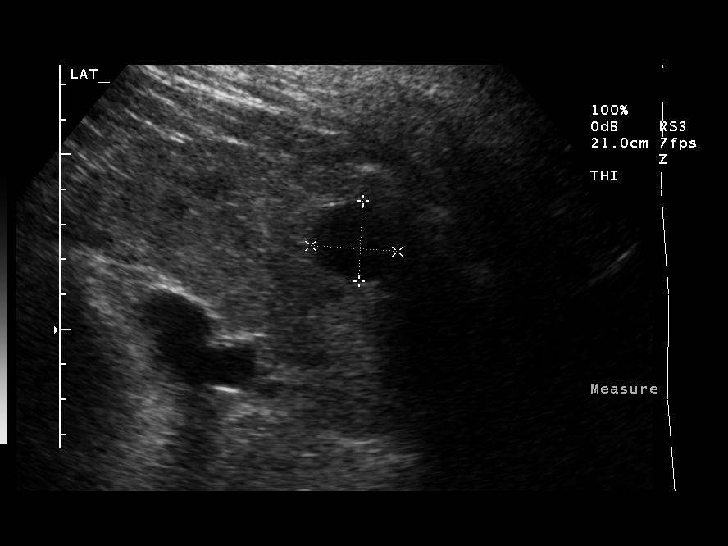
[im 16/46]
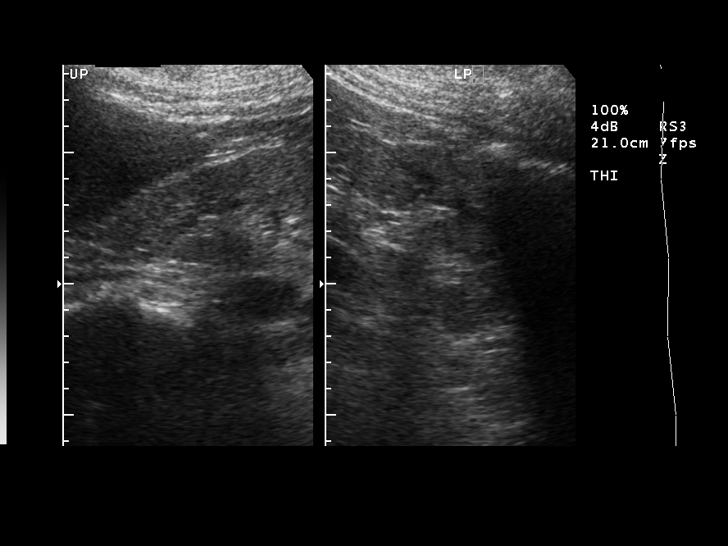
[im 17/46]
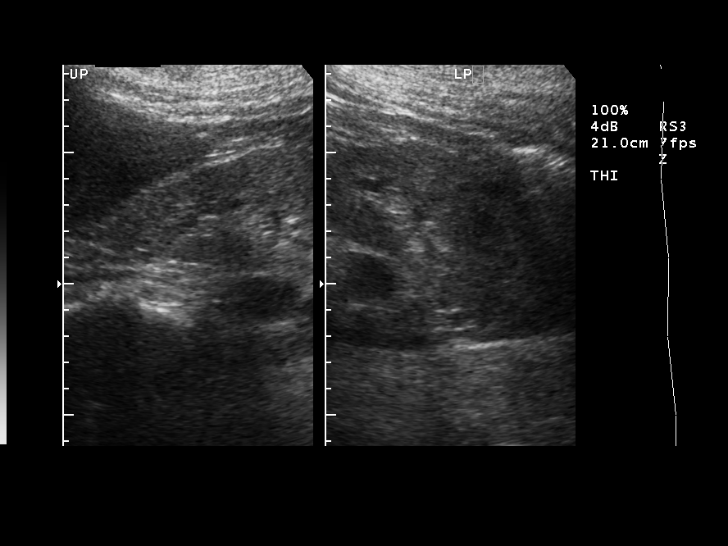
[im 21/46]
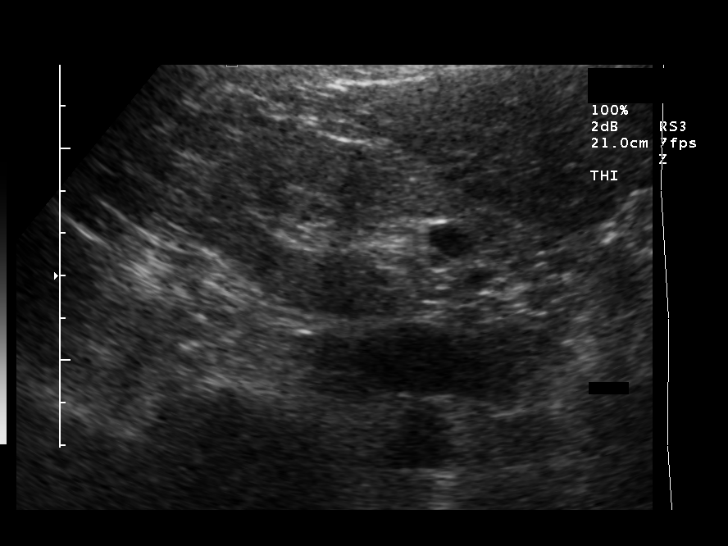
[im 25/46]
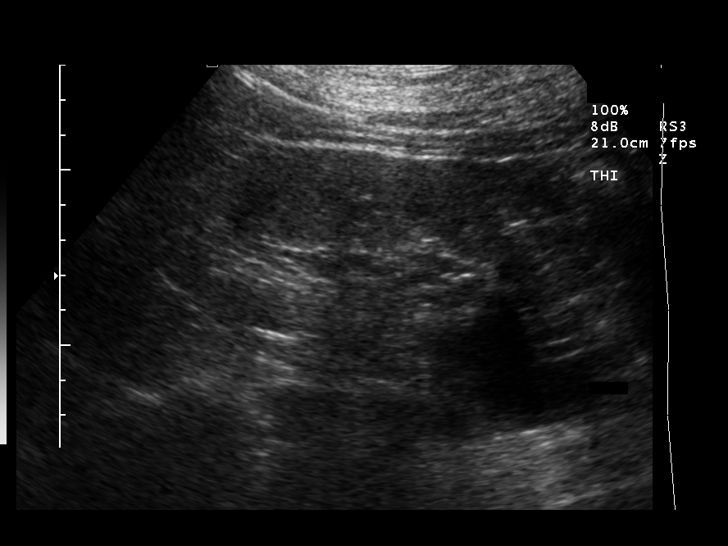
[im 29/46]
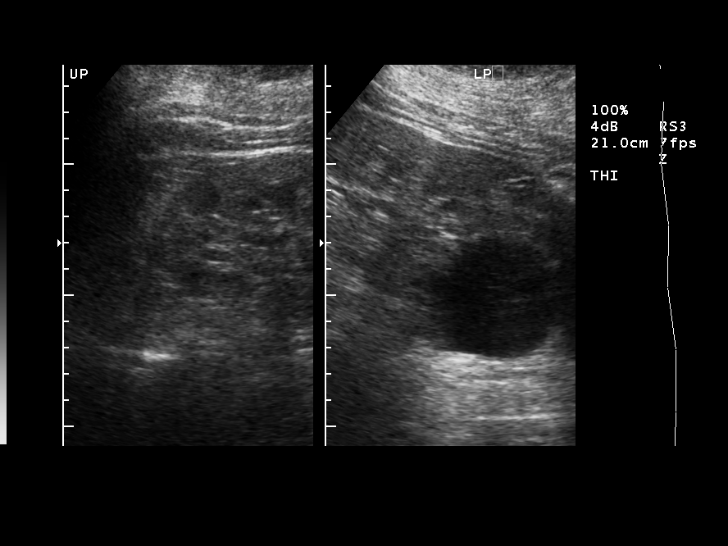
[im 31/46]
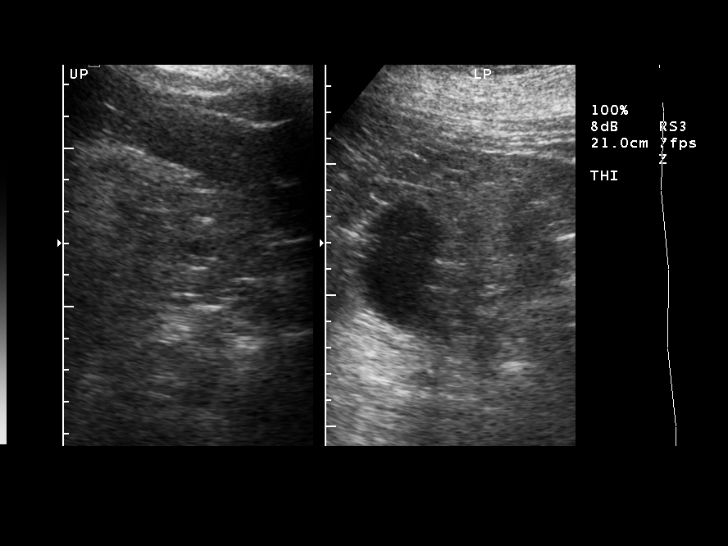
[im 34/46]
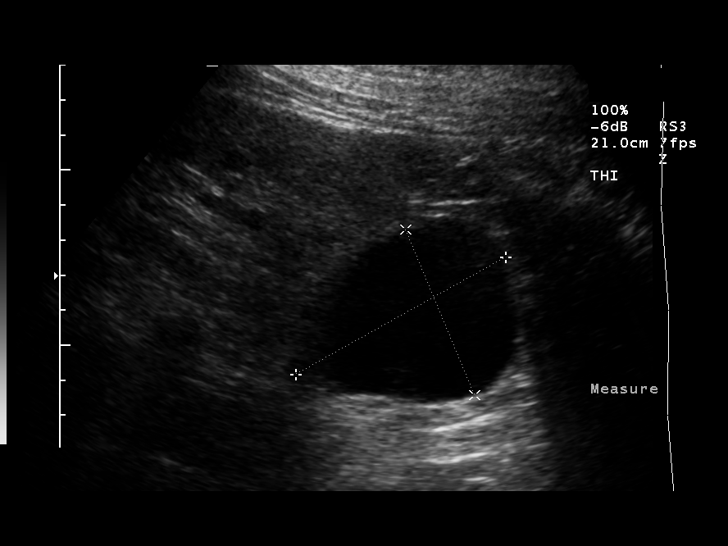
[im 38/46]
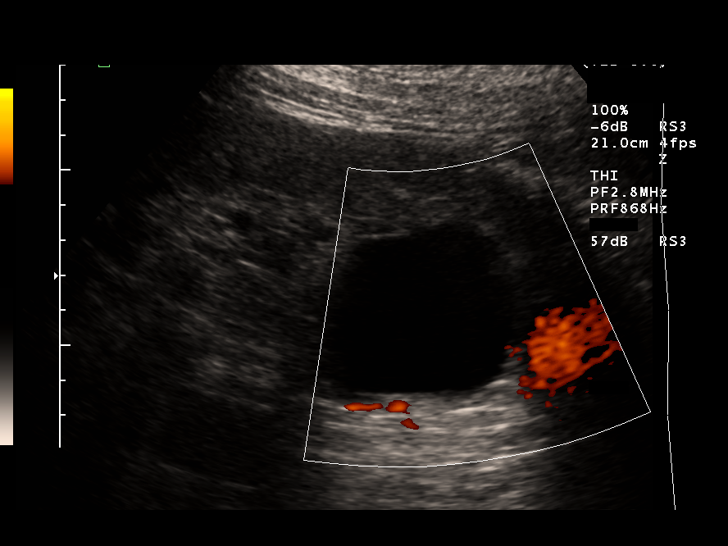
[im 42/46]
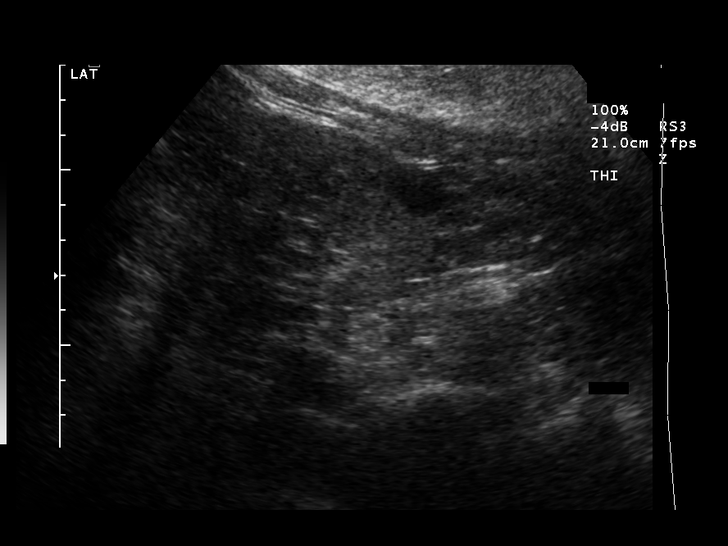
[im 46/46]
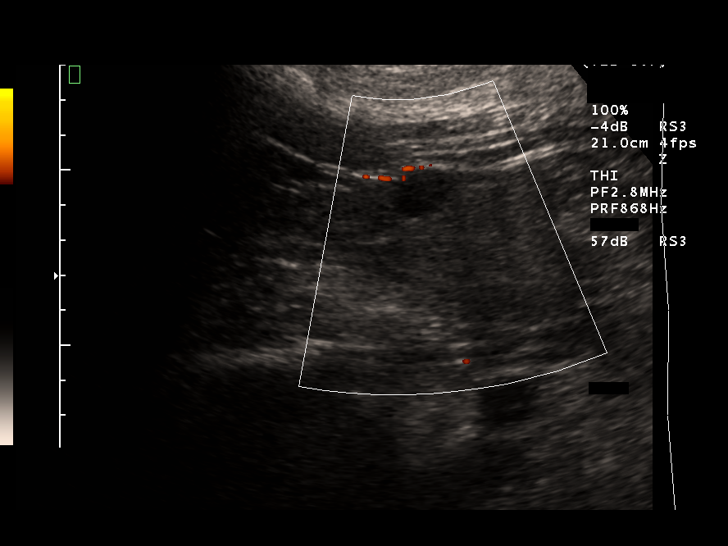

[14 of 25 positions shown; findings below may reference images not displayed]

FINDINGS: No hydronephrosis is seen.  The right kidney measures 11.4 cm sagittally with the left kidney measuring 11.3 cm.  The echogenicity of the renal parenchyma is increased consistent with chronic renal medical disease.  There are several cysts bilaterally.  The largest on the left emanates from the lower pole measuring 6.9 x 5.1 x 5.9 cm.   A cyst emanates from the lower pole on the right measuring 2.3 x 2.5 x 2.5 cm.  No solid renal lesion is seen.  The urinary bladder is unremarkable, not being well distended.
IMPRESSION: No hydronephrosis.  Echogenic kidneys.  Bilateral renal cysts.

## 2006-11-20 ENCOUNTER — Ambulatory Visit (HOSPITAL_COMMUNITY): Admission: RE | Admit: 2006-11-20 | Discharge: 2006-11-20 | Payer: Self-pay | Admitting: Obstetrics and Gynecology

## 2006-12-17 ENCOUNTER — Encounter: Admission: RE | Admit: 2006-12-17 | Discharge: 2006-12-17 | Payer: Self-pay | Admitting: Sports Medicine

## 2006-12-17 IMAGING — CT CT EXTREM LOW W/O CM*R*
2 of 6 series · 4 of 14 positions shown, 5 images · IV contrast (agent unspecified)
Comparison: None.

CLINICAL DATA: 60 year old with right foot pain.  Clinical concern for a fifth metatarsal stress fracture. 
 CT RIGHT FOOT WITHOUT CONTRAST:
TECHNIQUE: Multidetector CT imaging was performed according to the standard protocol.  No intravenous contrast was administered.  Multiplanar CT image reconstructions were also generated.

[Series 5: bone windows · axial · 0.39mm/px · z∈[-223,-146]mm · 2 of 185 slices shown, 3 images]
[im 62/185  soft-tissue]
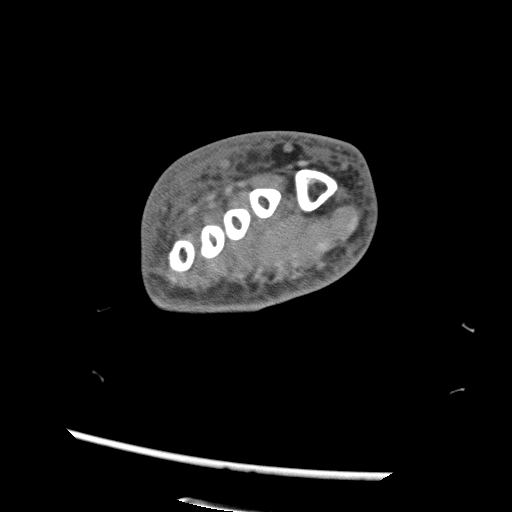
[im 62/185  bone]
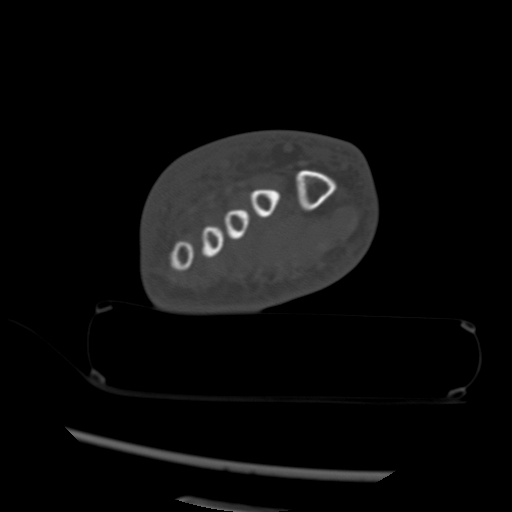
[im 123/185  bone]
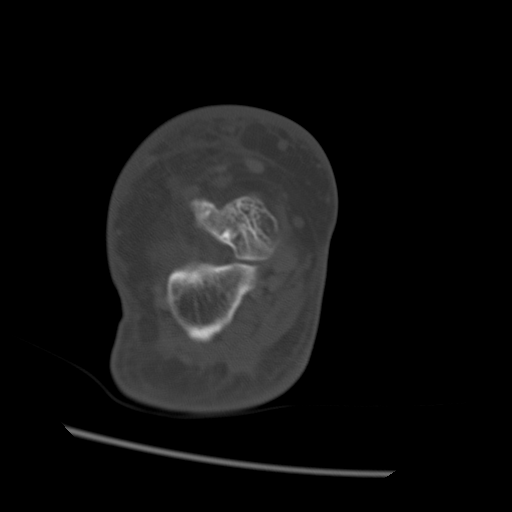

[Series 6: detail windows · axial · 0.39mm/px · z∈[-223,-146]mm · 2 of 185 slices shown]
[im 62/185  bone]
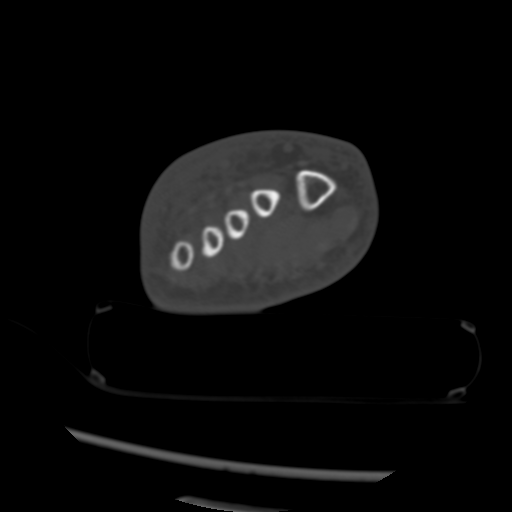
[im 123/185  bone]
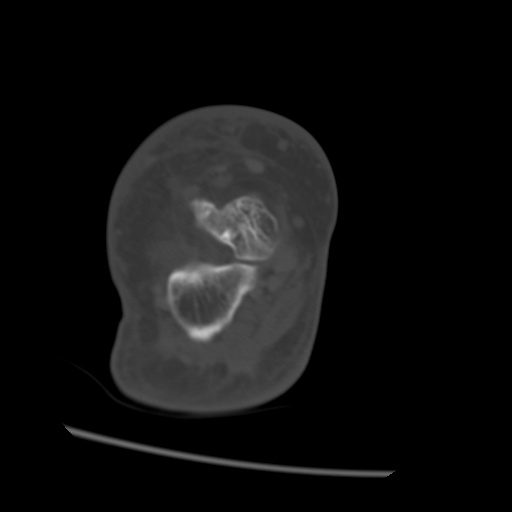

[4 of 14 positions shown; findings below may reference images not displayed]

FINDINGS: There is significant soft tissue swelling/edema particularly about the dorsum of the foot, but also extending up to the ankle.  There are mild tibiotalar degenerative changes and mild subtalar degenerative changes.  There are fairly significant midfoot degenerative changes particularly at the intertarsal and metatarsophalangeal joints.  There are degenerative spurring changes, joint space narrowing, subchondral cystic change, and areas of bony eburnation.  There are findings suggestive of a healed or healing stress fracture involving the base of the fifth metatarsal.  There is a faint lucent line along the lateral cortex, and there is surrounding callus formation.
IMPRESSION: 1.  Significant midfoot degenerative changes for the patient?s age. 
 2.  Healed or healing stress fracture involving the base of the fifth metatarsal with a faint lucent cortical line laterally and callus formation. 
 3.  Diffuse soft tissue swelling/edema particularly about the dorsum of the foot.

## 2007-07-07 ENCOUNTER — Encounter: Payer: Self-pay | Admitting: Pulmonary Disease

## 2007-07-07 DIAGNOSIS — I10 Essential (primary) hypertension: Secondary | ICD-10-CM

## 2007-07-07 DIAGNOSIS — J45909 Unspecified asthma, uncomplicated: Secondary | ICD-10-CM | POA: Insufficient documentation

## 2007-07-07 HISTORY — DX: Essential (primary) hypertension: I10

## 2007-07-17 DIAGNOSIS — I639 Cerebral infarction, unspecified: Secondary | ICD-10-CM | POA: Insufficient documentation

## 2007-07-17 DIAGNOSIS — I619 Nontraumatic intracerebral hemorrhage, unspecified: Secondary | ICD-10-CM | POA: Insufficient documentation

## 2007-07-17 HISTORY — DX: Nontraumatic intracerebral hemorrhage, unspecified: I61.9

## 2007-07-17 HISTORY — DX: Cerebral infarction, unspecified: I63.9

## 2007-07-25 LAB — HM COLONOSCOPY: HM Colonoscopy: NORMAL

## 2007-08-18 ENCOUNTER — Encounter: Payer: Self-pay | Admitting: Family Medicine

## 2007-08-19 ENCOUNTER — Encounter (INDEPENDENT_AMBULATORY_CARE_PROVIDER_SITE_OTHER): Payer: Self-pay | Admitting: Family Medicine

## 2007-09-06 ENCOUNTER — Encounter: Payer: Self-pay | Admitting: Family Medicine

## 2007-09-18 ENCOUNTER — Ambulatory Visit: Payer: Self-pay | Admitting: Family Medicine

## 2007-10-20 ENCOUNTER — Ambulatory Visit: Payer: Self-pay | Admitting: Family Medicine

## 2007-10-20 DIAGNOSIS — L409 Psoriasis, unspecified: Secondary | ICD-10-CM | POA: Insufficient documentation

## 2007-10-20 DIAGNOSIS — L738 Other specified follicular disorders: Secondary | ICD-10-CM | POA: Insufficient documentation

## 2007-10-21 ENCOUNTER — Encounter: Payer: Self-pay | Admitting: Family Medicine

## 2007-10-26 ENCOUNTER — Telehealth (INDEPENDENT_AMBULATORY_CARE_PROVIDER_SITE_OTHER): Payer: Self-pay | Admitting: *Deleted

## 2007-11-07 ENCOUNTER — Encounter: Payer: Self-pay | Admitting: Family Medicine

## 2007-11-17 ENCOUNTER — Encounter: Payer: Self-pay | Admitting: Family Medicine

## 2007-12-04 ENCOUNTER — Ambulatory Visit (HOSPITAL_COMMUNITY): Admission: RE | Admit: 2007-12-04 | Discharge: 2007-12-04 | Payer: Self-pay | Admitting: Family Medicine

## 2007-12-12 ENCOUNTER — Encounter (INDEPENDENT_AMBULATORY_CARE_PROVIDER_SITE_OTHER): Payer: Self-pay | Admitting: *Deleted

## 2007-12-13 ENCOUNTER — Encounter: Payer: Self-pay | Admitting: Family Medicine

## 2007-12-16 ENCOUNTER — Encounter: Payer: Self-pay | Admitting: Family Medicine

## 2008-01-21 ENCOUNTER — Telehealth (INDEPENDENT_AMBULATORY_CARE_PROVIDER_SITE_OTHER): Payer: Self-pay | Admitting: *Deleted

## 2008-02-26 ENCOUNTER — Encounter: Payer: Self-pay | Admitting: Family Medicine

## 2008-04-29 ENCOUNTER — Ambulatory Visit: Payer: Self-pay | Admitting: Family Medicine

## 2008-05-03 ENCOUNTER — Encounter: Payer: Self-pay | Admitting: Family Medicine

## 2008-05-12 ENCOUNTER — Encounter: Payer: Self-pay | Admitting: Family Medicine

## 2008-05-13 ENCOUNTER — Encounter: Payer: Self-pay | Admitting: Family Medicine

## 2008-08-13 ENCOUNTER — Encounter: Payer: Self-pay | Admitting: Family Medicine

## 2008-09-06 ENCOUNTER — Encounter: Payer: Self-pay | Admitting: Family Medicine

## 2008-09-15 ENCOUNTER — Encounter: Payer: Self-pay | Admitting: Family Medicine

## 2008-09-16 ENCOUNTER — Encounter: Payer: Self-pay | Admitting: Family Medicine

## 2008-09-17 ENCOUNTER — Encounter: Payer: Self-pay | Admitting: Family Medicine

## 2008-09-21 ENCOUNTER — Telehealth: Payer: Self-pay | Admitting: Internal Medicine

## 2008-09-21 ENCOUNTER — Encounter: Payer: Self-pay | Admitting: Internal Medicine

## 2008-09-21 ENCOUNTER — Inpatient Hospital Stay (HOSPITAL_COMMUNITY): Admission: AD | Admit: 2008-09-21 | Discharge: 2008-09-23 | Payer: Self-pay | Admitting: Internal Medicine

## 2008-09-21 ENCOUNTER — Ambulatory Visit: Payer: Self-pay | Admitting: Internal Medicine

## 2008-09-21 ENCOUNTER — Ambulatory Visit: Payer: Self-pay | Admitting: Family Medicine

## 2008-09-21 DIAGNOSIS — G8190 Hemiplegia, unspecified affecting unspecified side: Secondary | ICD-10-CM | POA: Insufficient documentation

## 2008-09-21 IMAGING — CR DG CHEST 2V
2 series · 2 of 2 positions shown · non-contrast
Comparison: Chest x-ray of [DATE]

CLINICAL DATA: Chest pain, left-sided weakness, shortness of breath

CHEST - 2 VIEW

[w chest pa]
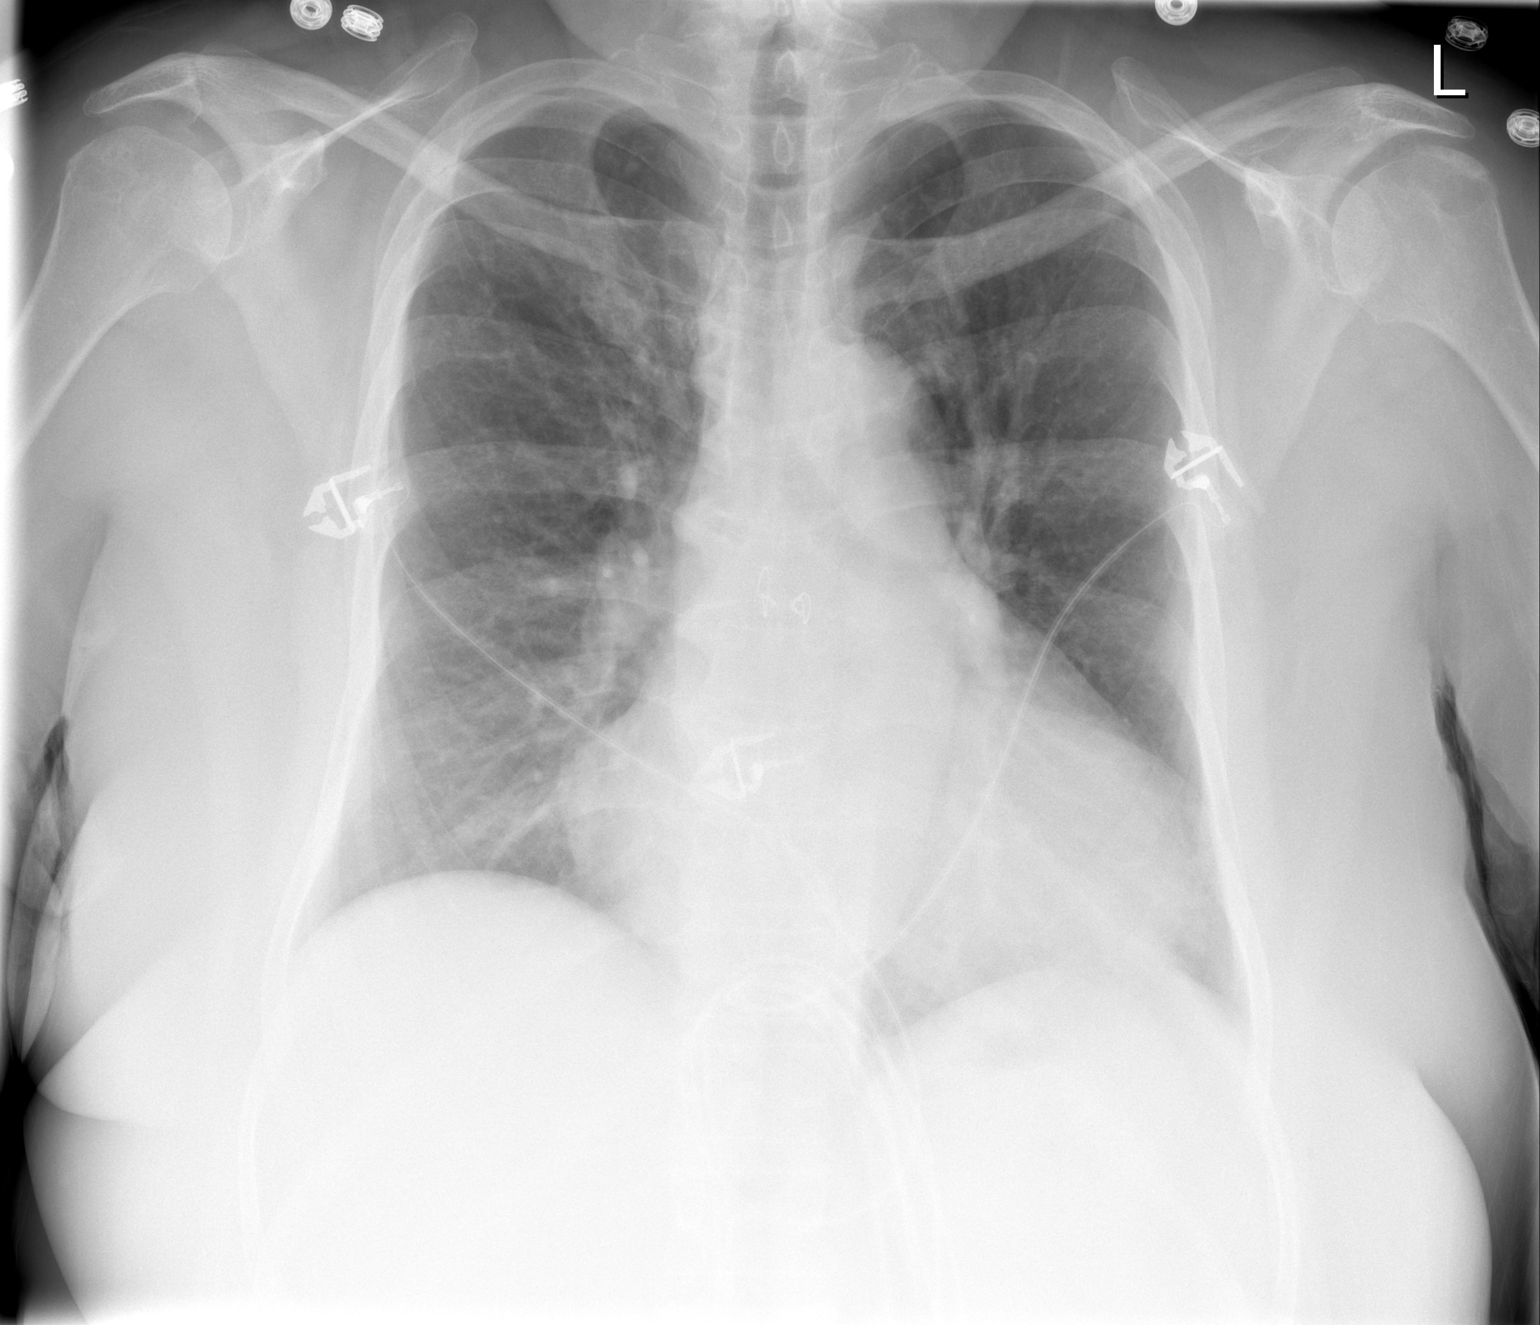

[w chest lat]
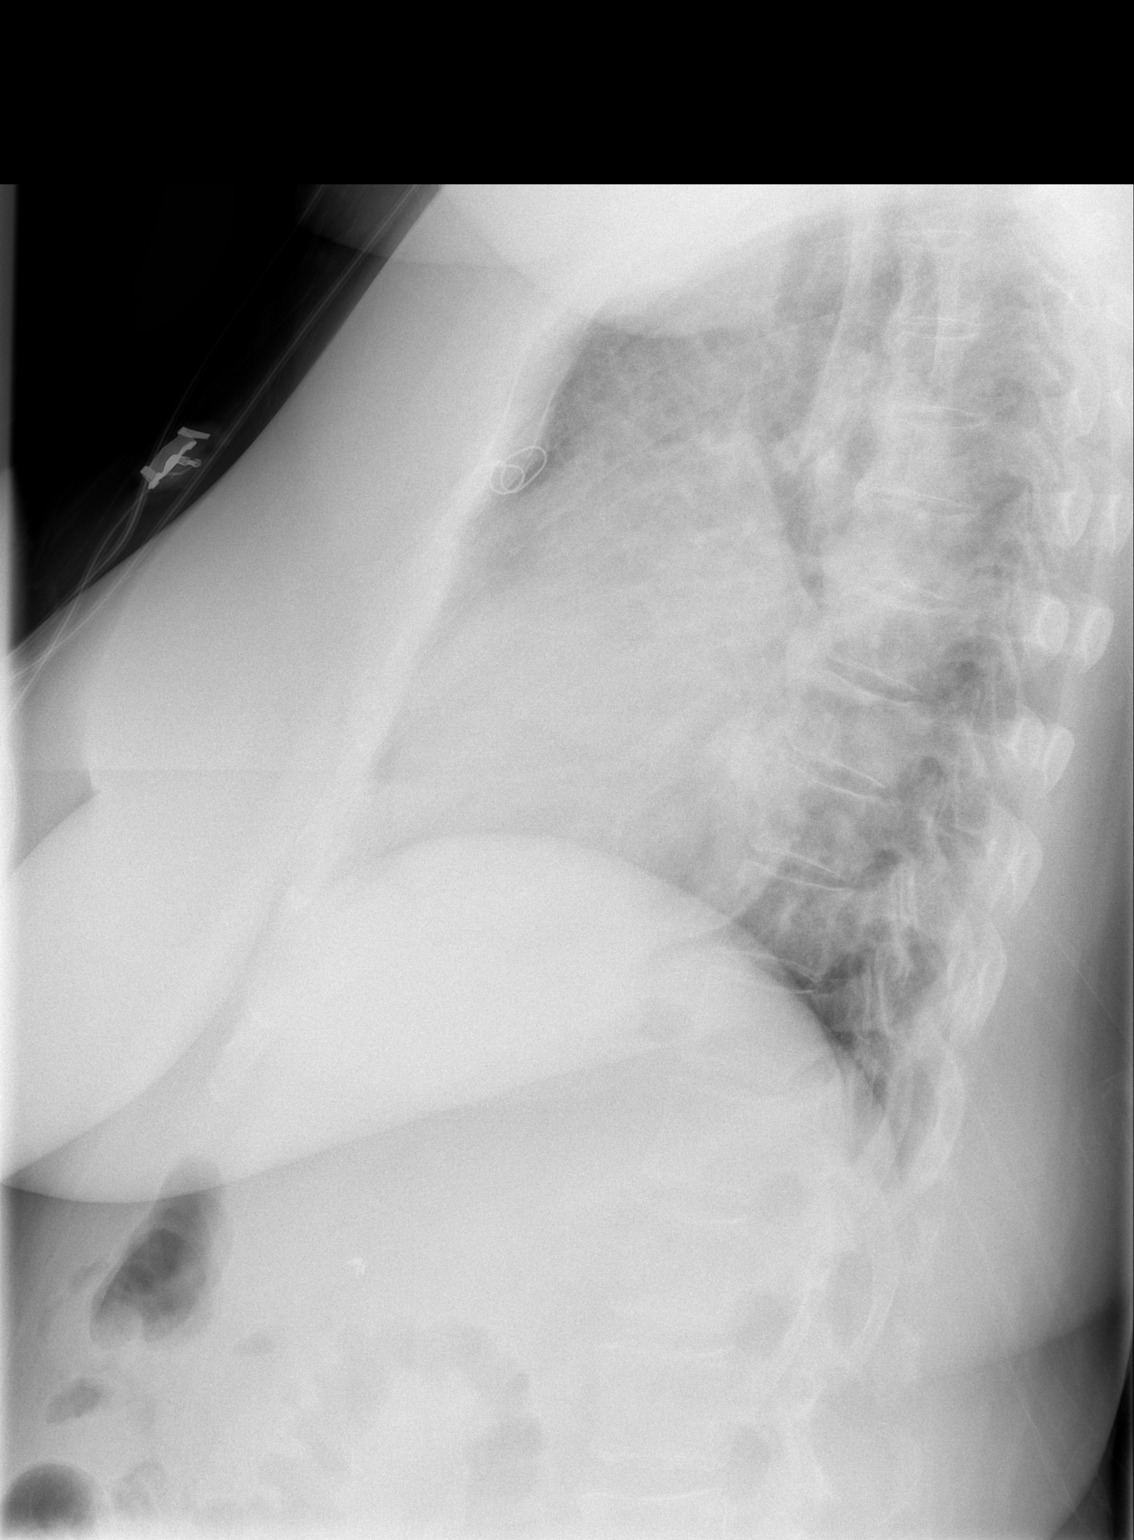

[2 of 2 positions shown; findings below may reference images not displayed]

FINDINGS: No active infiltrate or effusion is seen.  Moderate
cardiomegaly is present.  There are degenerative changes throughout
the thoracic spine.
IMPRESSION: Moderate cardiomegaly.  No active lung disease.

## 2008-09-21 IMAGING — CT CT HEAD W/O CM
1 of 2 series · 13 of 30 positions shown, 17 images · non-contrast
Comparison: None.

CLINICAL DATA: 62-year-old female with onset of left-sided weakness
this morning.

CT HEAD WITHOUT CONTRAST
TECHNIQUE: Contiguous axial images were obtained from the base of
the skull through the vertex without contrast.

[Series 2: brain · axial · 0.47mm/px · z∈[-114,+15]mm · 13 of 28 slices shown, 17 images]
[im 2/28  brain]
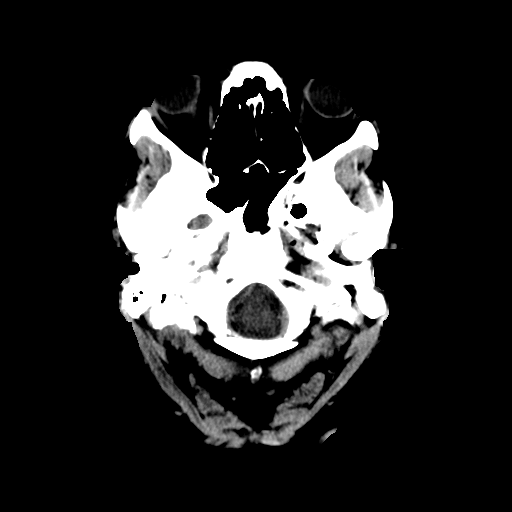
[im 2/28  bone]
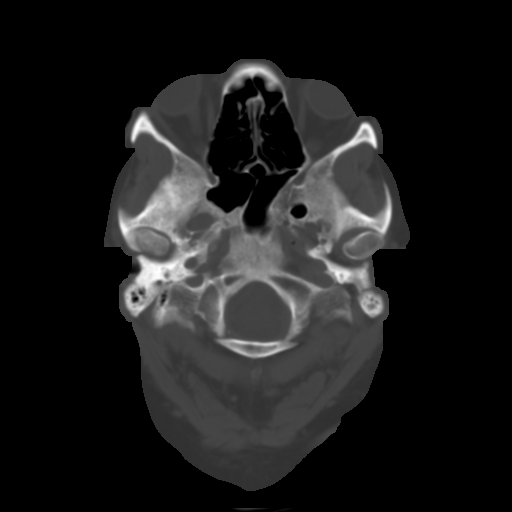
[im 4/28  brain]
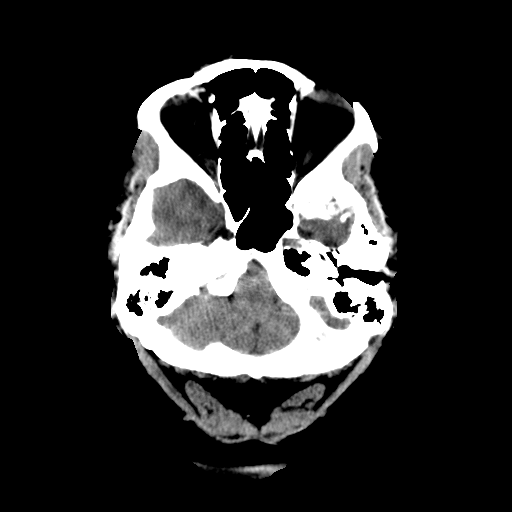
[im 6/28  brain]
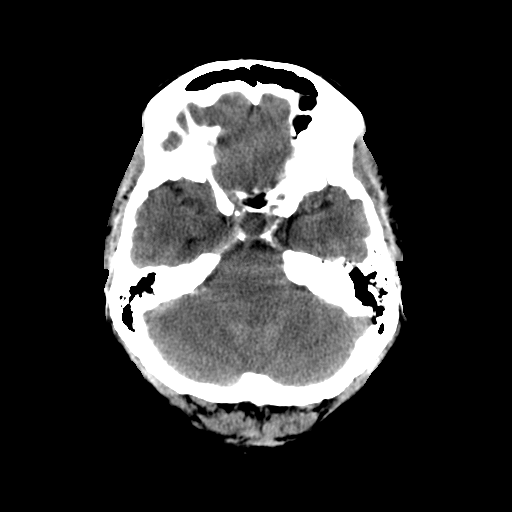
[im 8/28  brain]
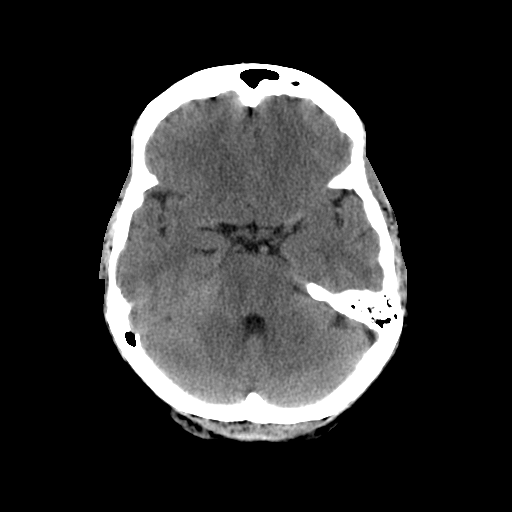
[im 10/28  brain]
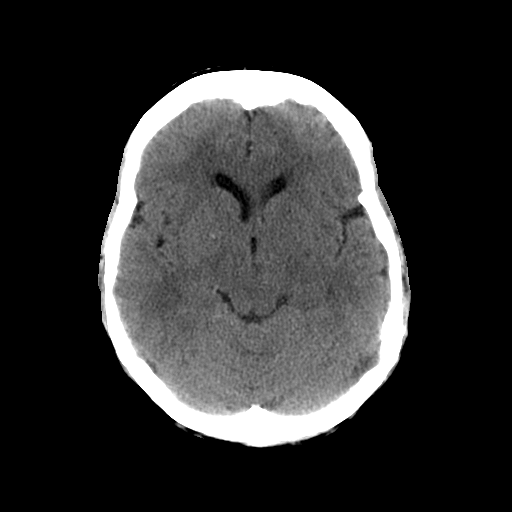
[im 10/28  bone]
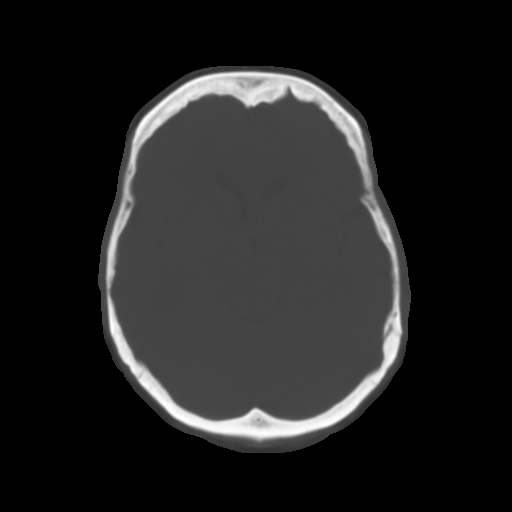
[im 12/28  brain]
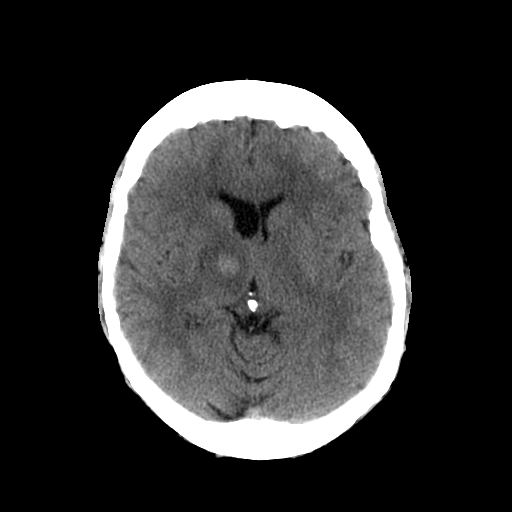
[im 14/28  brain]
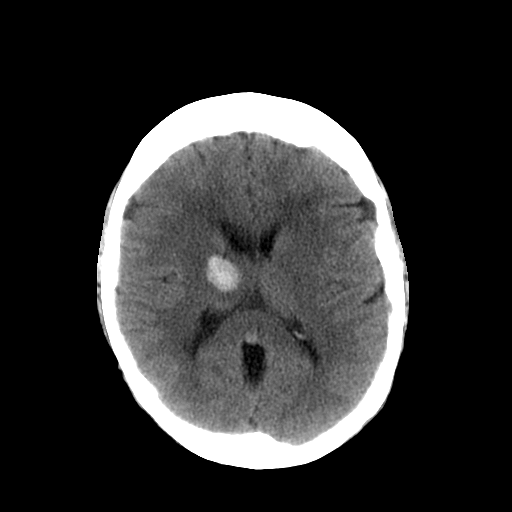
[im 16/28  brain]
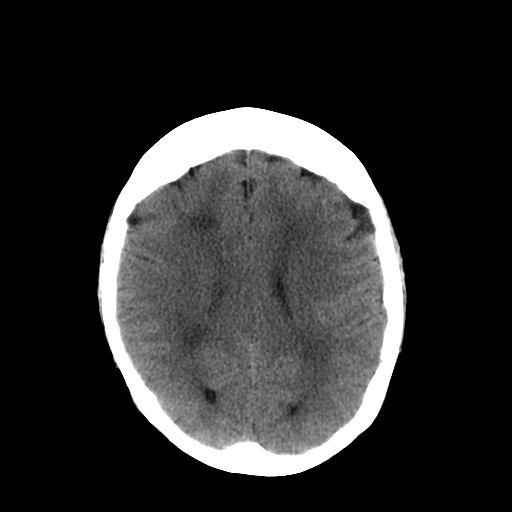
[im 18/28  brain]
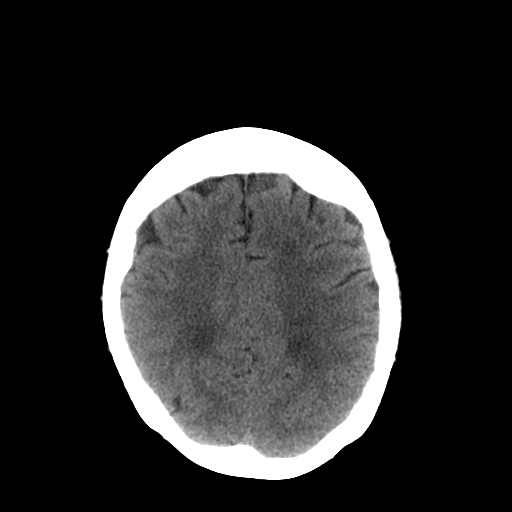
[im 18/28  bone]
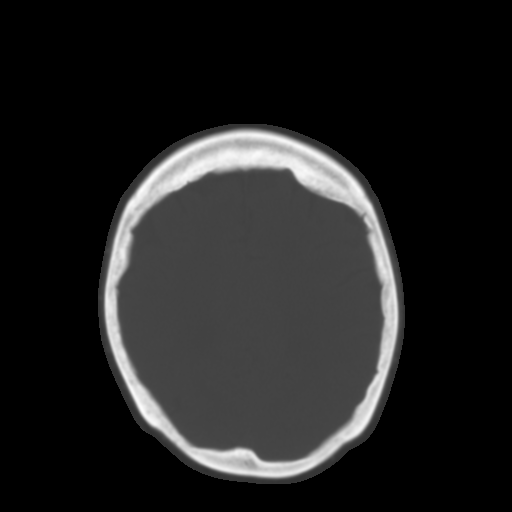
[im 20/28  brain]
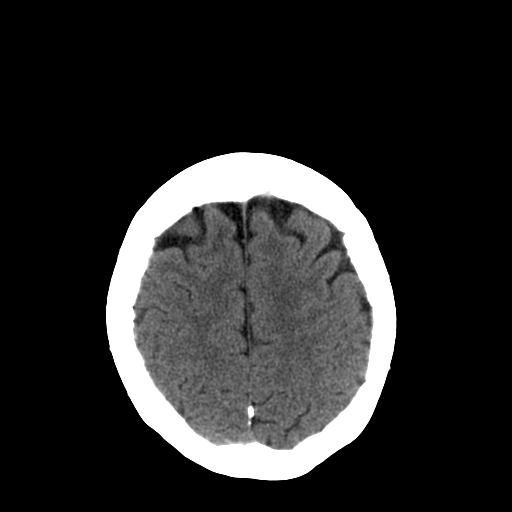
[im 22/28  brain]
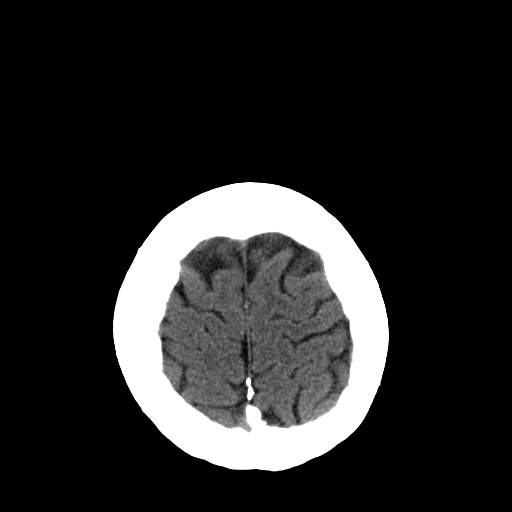
[im 24/28  brain]
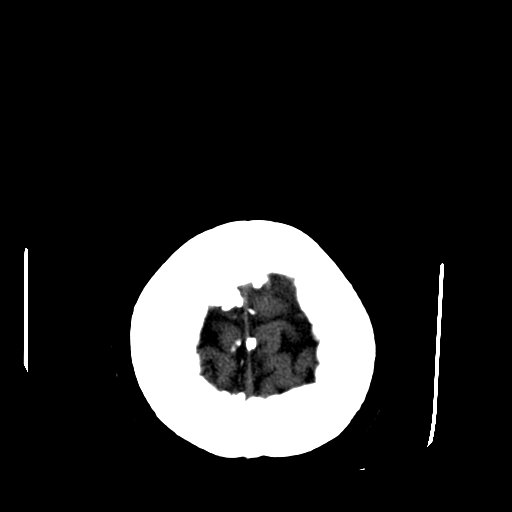
[im 26/28  brain]
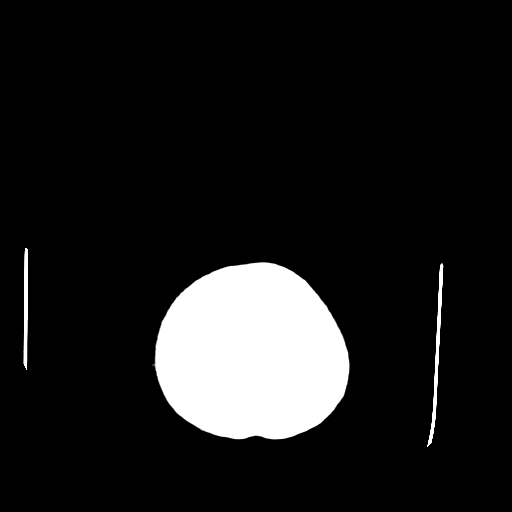
[im 26/28  bone]
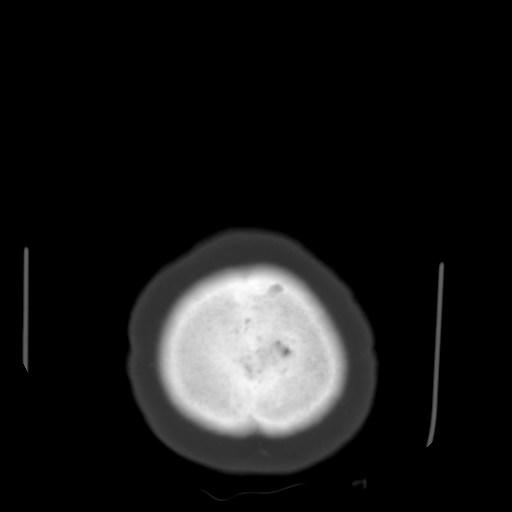

[13 of 30 positions shown; findings below may reference images not displayed]

FINDINGS: Hyperdense parenchymal hemorrhage centered at the
junction of the anterior thalamus and lentiform nuclei on the right
measures 15 x 19 mm and there is surrounding edema.  No midline
shift.  Mild mass effect on the right lateral ventricle without
ventriculomegaly.  No intraventricular or extra-axial hemorrhage.

Elsewhere there is scattered cerebral white matter hypodensity with
mild involvement of the internal capsules and brain stem. No
suspicious intracranial vascular hyperdensity.

Visualized orbits and scalp soft tissues are within normal limits.
Calcified atherosclerosis at the skull base.  Visualized paranasal
sinuses and mastoids are clear.  No acute osseous abnormality
identified.
IMPRESSION: 1.  Acute right thalamic/basal ganglia hemorrhage measuring 15 x 19
mm with surrounding edema.
2.  Mild mass effect.  No herniation, ventriculomegaly or extra-
axial hemorrhage.
3.  Underlying advanced cerebral white matter disease for age,
favor small vessel ischemia.

Critical test results telephoned to Dr. AYAZO at the time of
interpretation on [DATE] at [NC] hours.

## 2008-09-22 ENCOUNTER — Telehealth (INDEPENDENT_AMBULATORY_CARE_PROVIDER_SITE_OTHER): Payer: Self-pay | Admitting: *Deleted

## 2008-09-22 ENCOUNTER — Encounter: Payer: Self-pay | Admitting: Family Medicine

## 2008-09-22 ENCOUNTER — Encounter: Payer: Self-pay | Admitting: Internal Medicine

## 2008-09-22 IMAGING — CT CT HEAD W/O CM
1 series · 16 of 30 positions shown, 20 images · non-contrast
Comparison: [DATE]

CLINICAL DATA: Right thalamic bleed - follow-up

CT HEAD WITHOUT CONTRAST
TECHNIQUE: Contiguous axial images were obtained from the base of
the skull through the vertex without contrast.

[Series 2: head routine 4.8 h37s · axial · 0.45mm/px · z∈[+1173,+1306]mm · 16 of 30 slices shown, 20 images]
[im 2/30  brain]
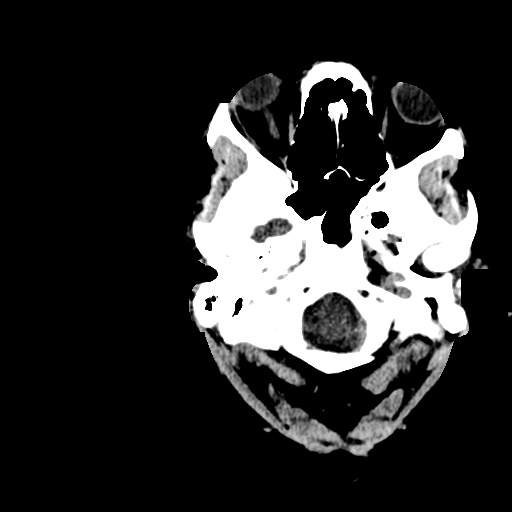
[im 2/30  bone]
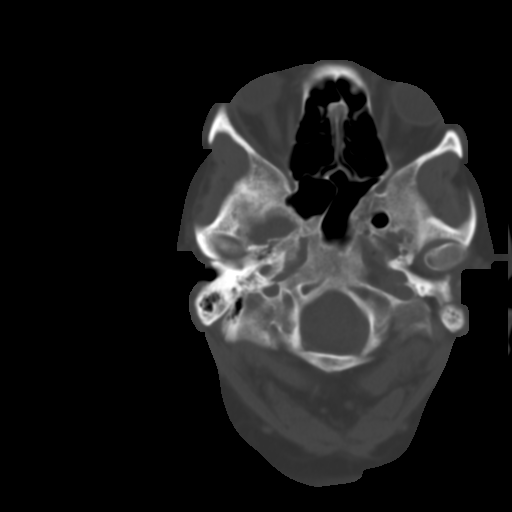
[im 4/30  brain]
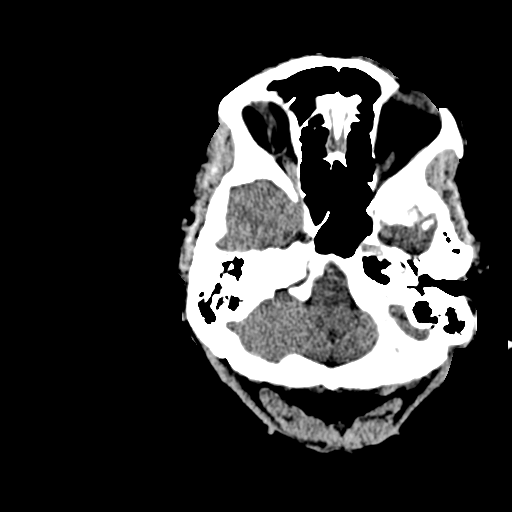
[im 6/30  brain]
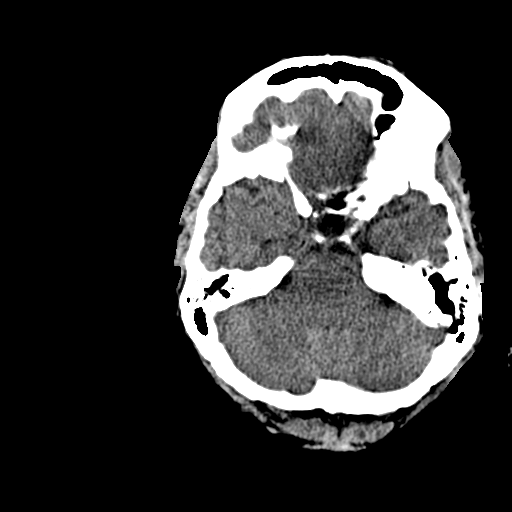
[im 8/30  brain]
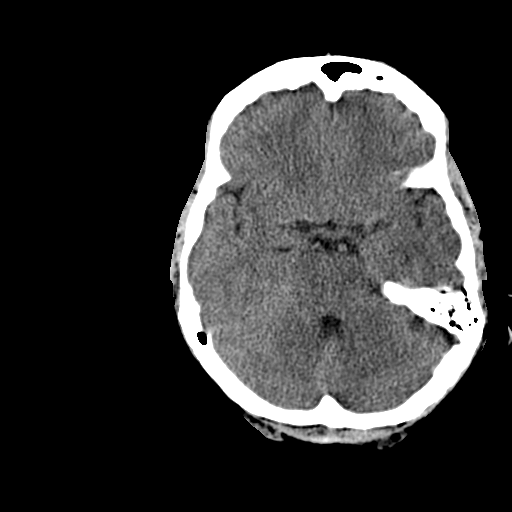
[im 9/30  brain]
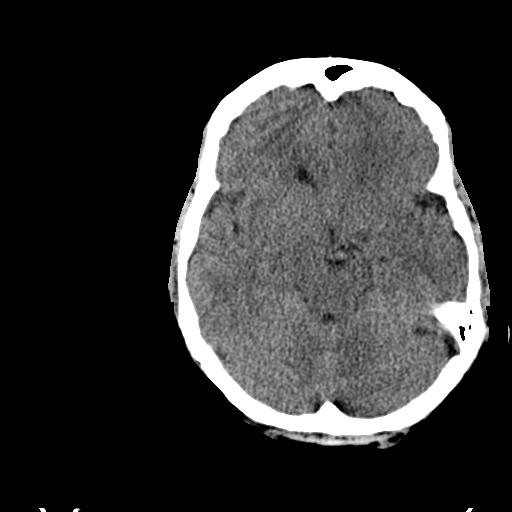
[im 9/30  bone]
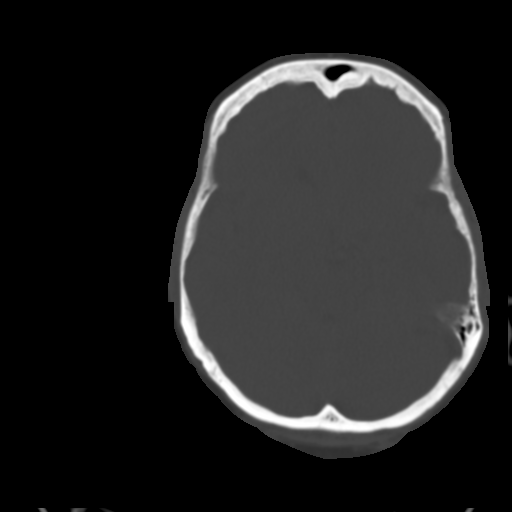
[im 11/30  brain]
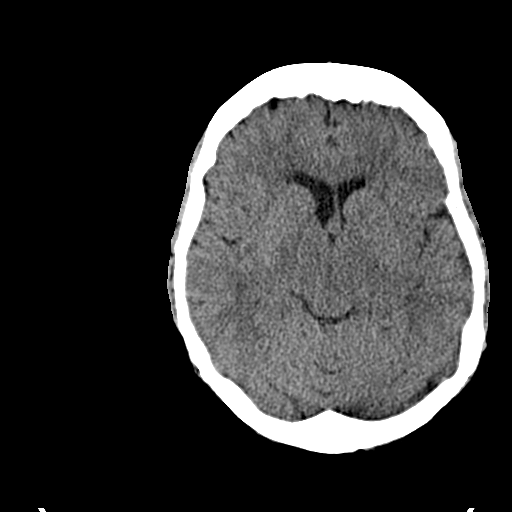
[im 13/30  brain]
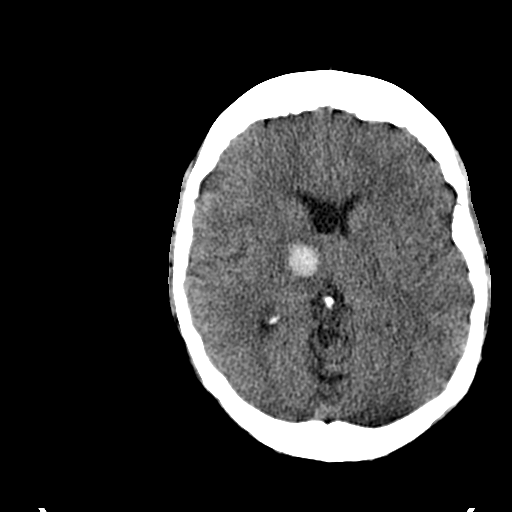
[im 15/30  brain]
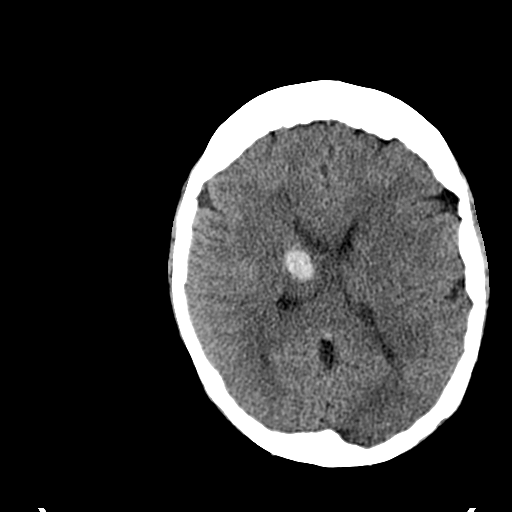
[im 16/30  brain]
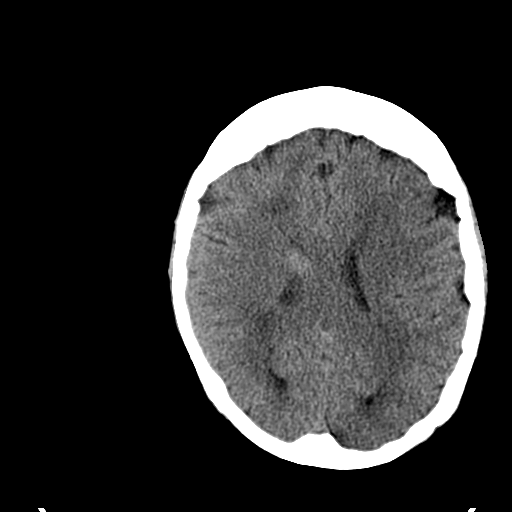
[im 16/30  bone]
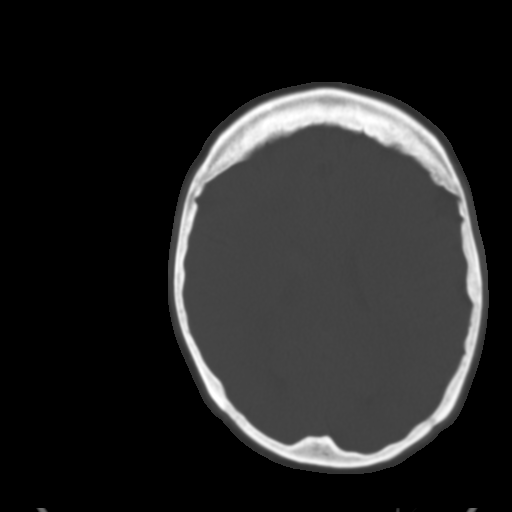
[im 18/30  brain]
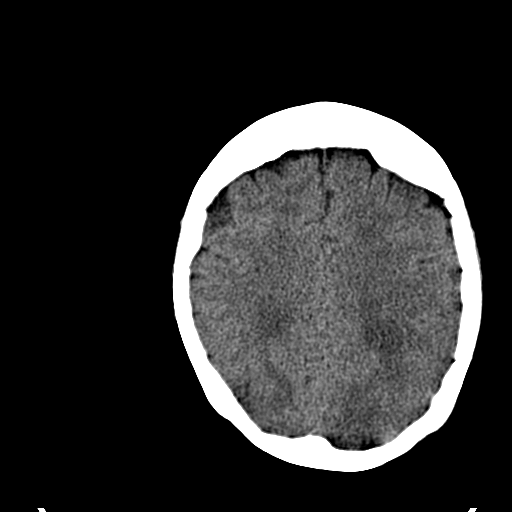
[im 20/30  brain]
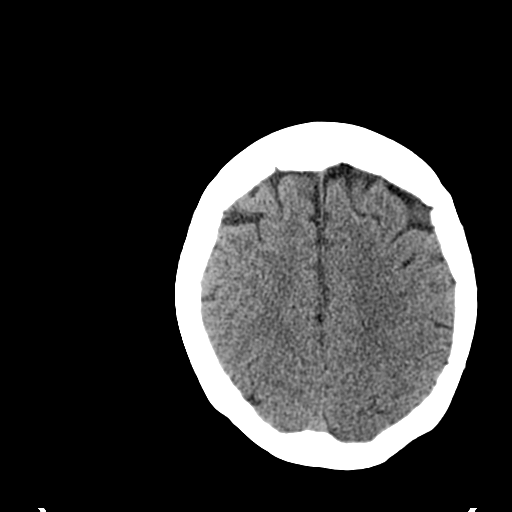
[im 22/30  brain]
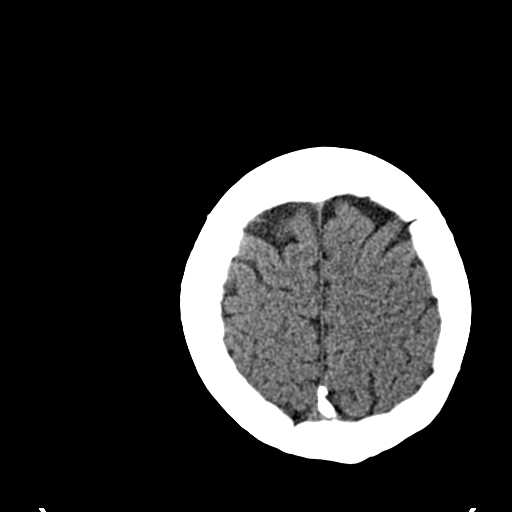
[im 23/30  brain]
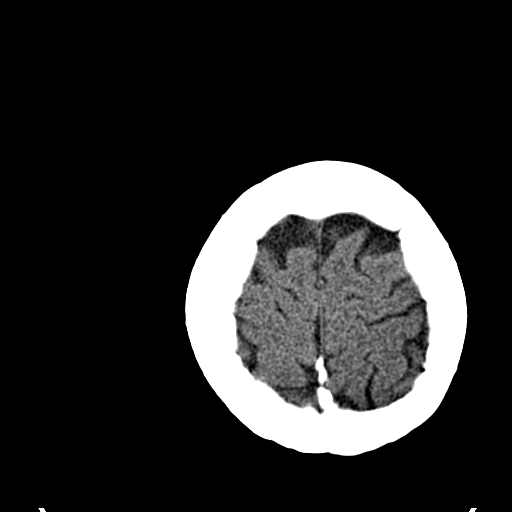
[im 23/30  bone]
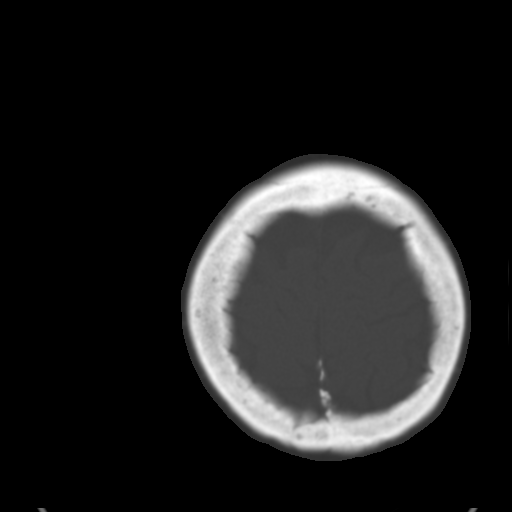
[im 25/30  brain]
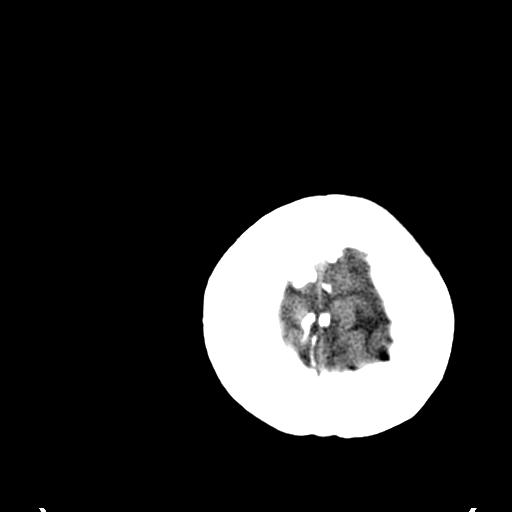
[im 27/30  brain]
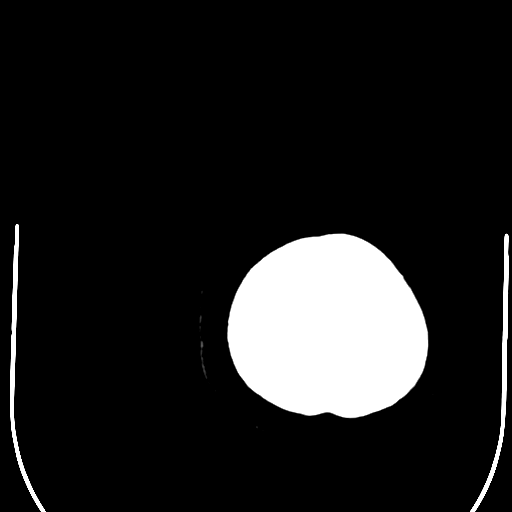
[im 29/30  brain]
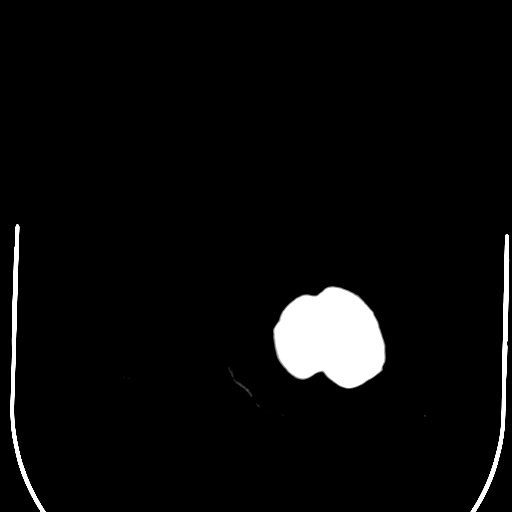

[16 of 30 positions shown; findings below may reference images not displayed]

FINDINGS: Hyperdense hematoma in the right thalamus, ovoid in
shape, not significantly changed in size or contour.  It currently
measures 20 x 15 mm.  It was previously measured at 19 x 15 mm.
Again noted is slight surrounding edema.  There is minimal mass
effect upon the right lateral ventricle, without change.  No
significant midline shift.

No other foci of hemorrhage or infarction.  No significant interval
change.

Calvarium intact.  No fluid in the sinuses visualized, or mastoid
air cells.
IMPRESSION: Stable right thalamic hematoma with minimal mass effect - no new
findings or significant interval change since [DATE].

## 2008-09-23 IMAGING — CT CT HEAD W/O CM
1 of 2 series · 13 of 30 positions shown, 17 images · non-contrast
Comparison: [DATE]

CLINICAL DATA: Stroke.  Left-sided weakness.  Follow-up
intracranial hemorrhage.

CT HEAD WITHOUT CONTRAST
TECHNIQUE: Contiguous axial images were obtained from the base of
the skull through the vertex without contrast.

[Series 2: brain · axial · 0.47mm/px · z∈[+138,+270]mm · 13 of 32 slices shown, 17 images]
[im 3/32  brain]
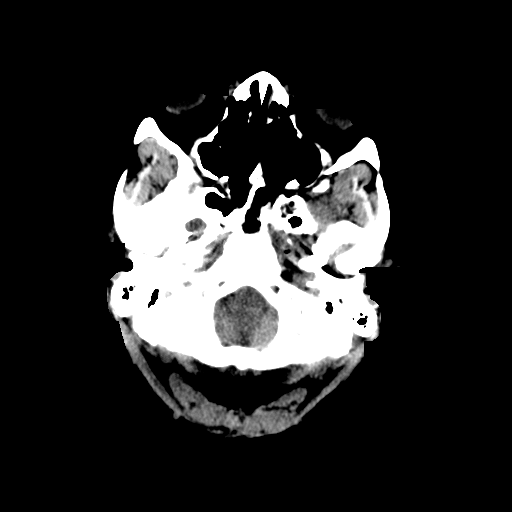
[im 3/32  bone]
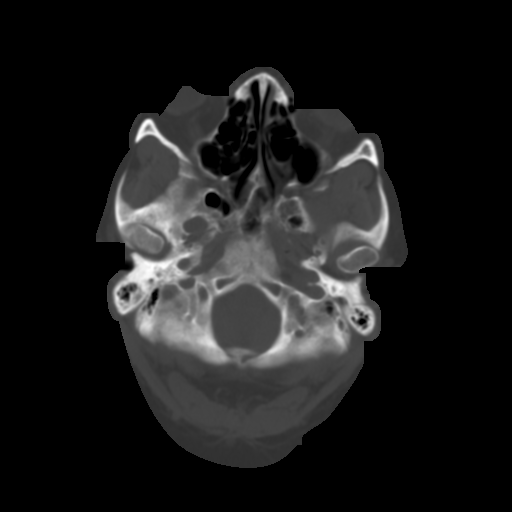
[im 5/32  brain]
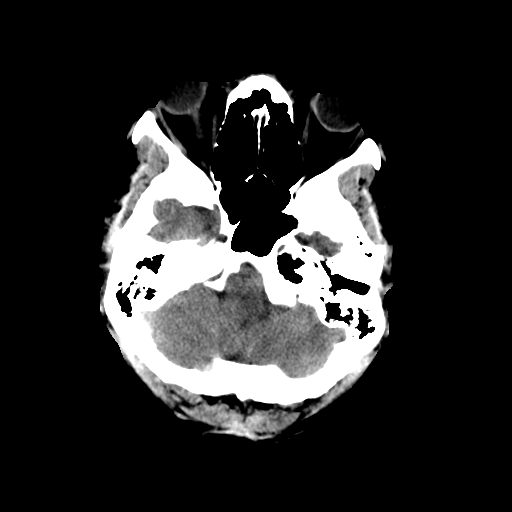
[im 7/32  brain]
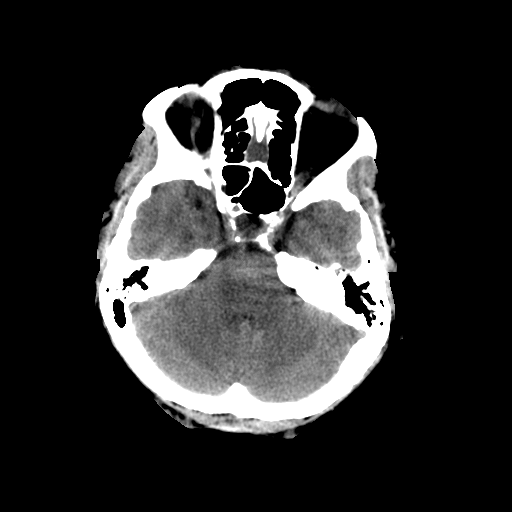
[im 9/32  brain]
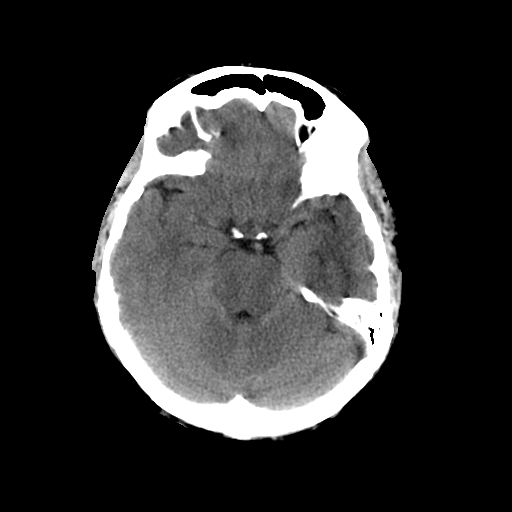
[im 12/32  brain]
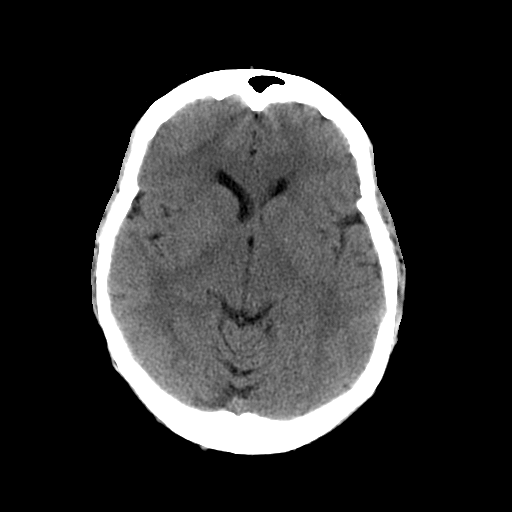
[im 12/32  bone]
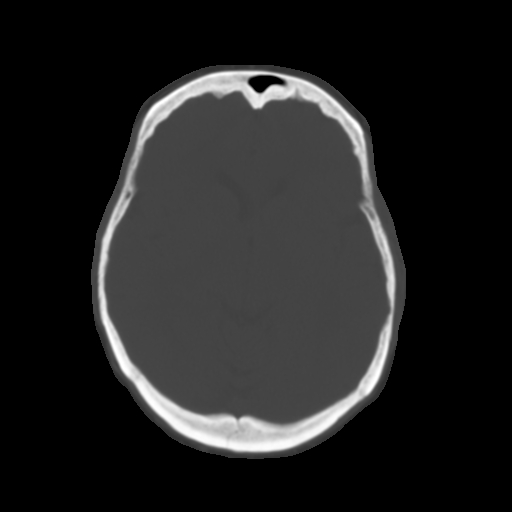
[im 14/32  brain]
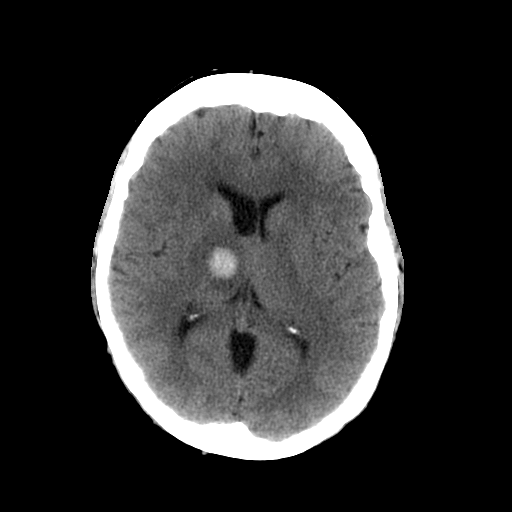
[im 16/32  brain]
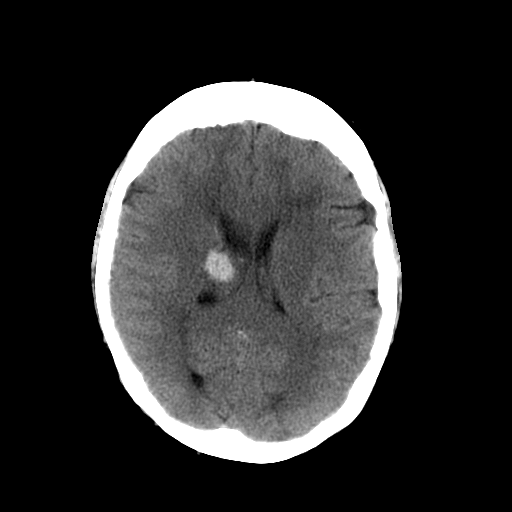
[im 18/32  brain]
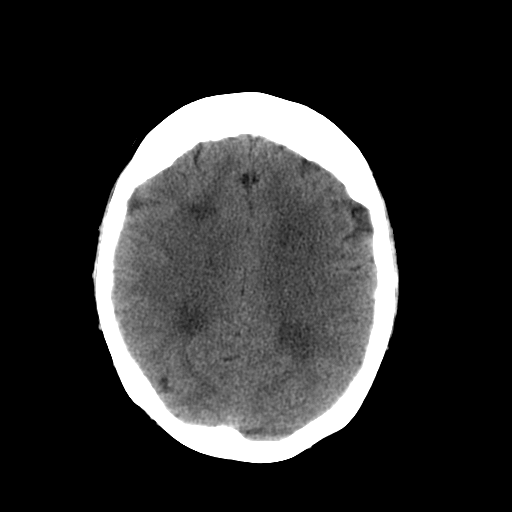
[im 20/32  brain]
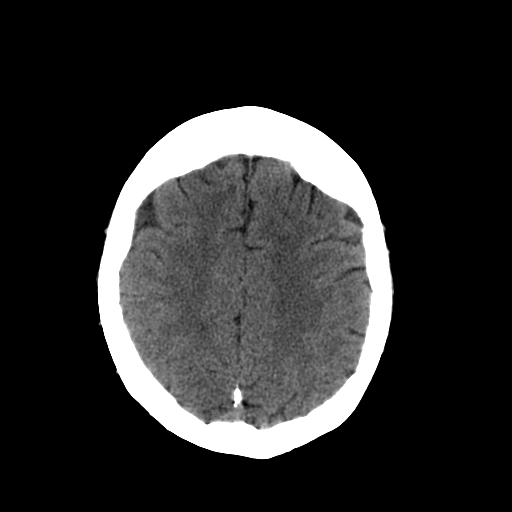
[im 20/32  bone]
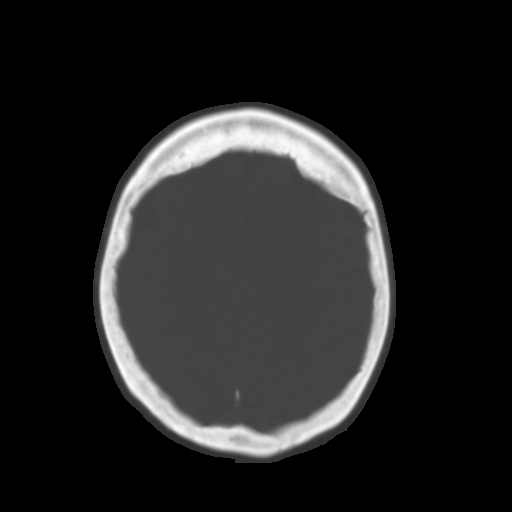
[im 23/32  brain]
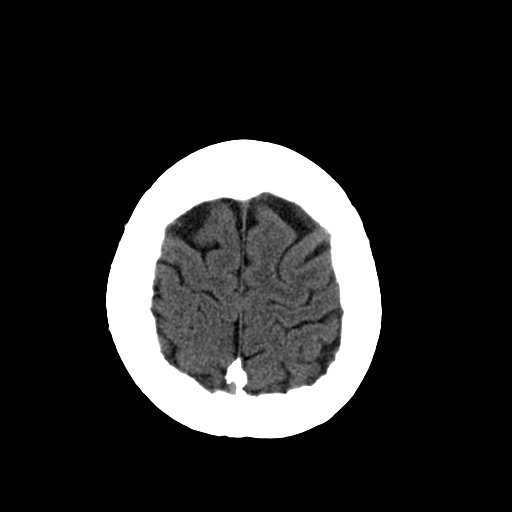
[im 25/32  brain]
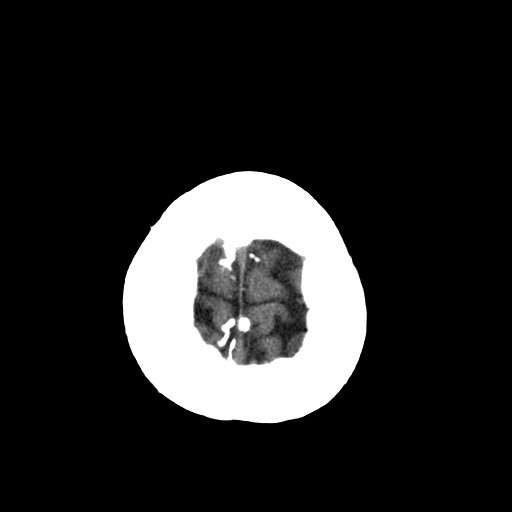
[im 27/32  brain]
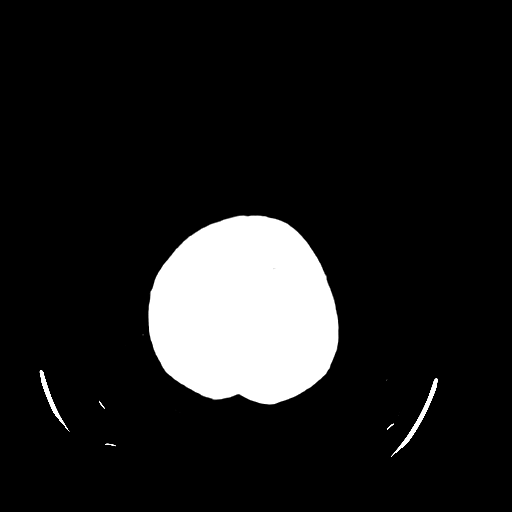
[im 29/32  brain]
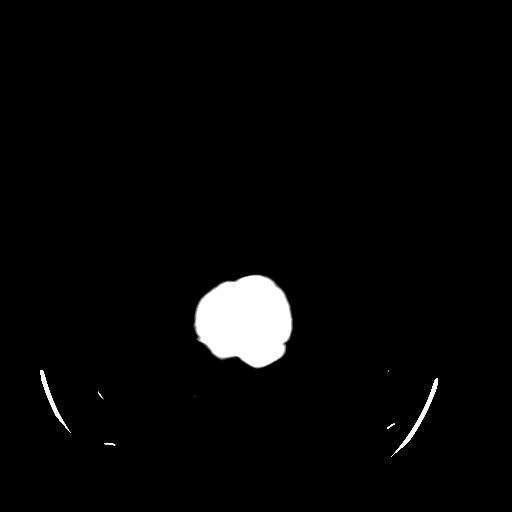
[im 29/32  bone]
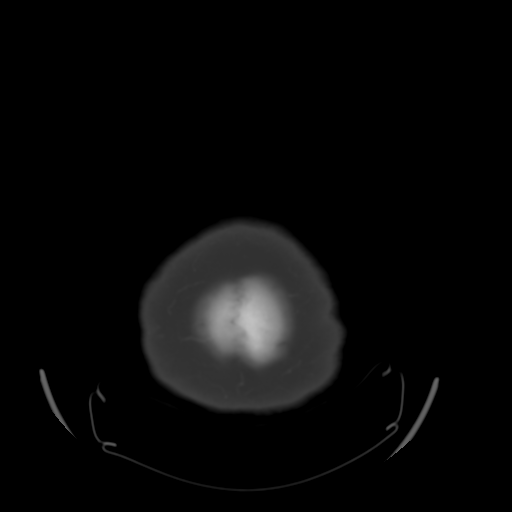

[13 of 30 positions shown; findings below may reference images not displayed]

FINDINGS: Intraparenchymal hematoma in the upper thalamus on the
right is no larger, measured at 15 x 8 2 mm today.  Mild
surrounding edema.  No intraventricular penetration.  No
significant mass effect or shift.  Chronic appearing small vessel
changes are present elsewhere throughout the white matter.  No
other area of identifiably acute infarction.  No sign of mass
lesion.  No hydrocephalus or extra-axial collection.
IMPRESSION: No change since yesterday.  Intraparenchymal hematoma in the upper
thalamus on the right is no larger.

## 2008-09-28 ENCOUNTER — Telehealth (INDEPENDENT_AMBULATORY_CARE_PROVIDER_SITE_OTHER): Payer: Self-pay | Admitting: *Deleted

## 2008-09-28 ENCOUNTER — Ambulatory Visit: Payer: Self-pay | Admitting: Family Medicine

## 2008-09-28 DIAGNOSIS — I6930 Unspecified sequelae of cerebral infarction: Secondary | ICD-10-CM | POA: Insufficient documentation

## 2008-09-28 DIAGNOSIS — I6350 Cerebral infarction due to unspecified occlusion or stenosis of unspecified cerebral artery: Secondary | ICD-10-CM | POA: Insufficient documentation

## 2008-09-28 DIAGNOSIS — I619 Nontraumatic intracerebral hemorrhage, unspecified: Secondary | ICD-10-CM | POA: Insufficient documentation

## 2008-09-30 ENCOUNTER — Telehealth (INDEPENDENT_AMBULATORY_CARE_PROVIDER_SITE_OTHER): Payer: Self-pay | Admitting: *Deleted

## 2008-10-04 ENCOUNTER — Telehealth (INDEPENDENT_AMBULATORY_CARE_PROVIDER_SITE_OTHER): Payer: Self-pay | Admitting: *Deleted

## 2008-10-04 ENCOUNTER — Encounter: Payer: Self-pay | Admitting: Family Medicine

## 2008-10-06 ENCOUNTER — Telehealth (INDEPENDENT_AMBULATORY_CARE_PROVIDER_SITE_OTHER): Payer: Self-pay | Admitting: *Deleted

## 2008-10-12 ENCOUNTER — Telehealth (INDEPENDENT_AMBULATORY_CARE_PROVIDER_SITE_OTHER): Payer: Self-pay | Admitting: *Deleted

## 2008-10-12 ENCOUNTER — Encounter: Payer: Self-pay | Admitting: Internal Medicine

## 2008-10-13 ENCOUNTER — Encounter: Payer: Self-pay | Admitting: Family Medicine

## 2008-10-14 ENCOUNTER — Encounter: Payer: Self-pay | Admitting: Family Medicine

## 2008-10-21 ENCOUNTER — Encounter: Payer: Self-pay | Admitting: Family Medicine

## 2008-11-01 ENCOUNTER — Encounter: Admission: RE | Admit: 2008-11-01 | Discharge: 2009-01-26 | Payer: Self-pay | Admitting: Family Medicine

## 2008-11-02 ENCOUNTER — Encounter: Payer: Self-pay | Admitting: Family Medicine

## 2008-11-03 ENCOUNTER — Ambulatory Visit: Payer: Self-pay | Admitting: Family Medicine

## 2008-11-03 DIAGNOSIS — B354 Tinea corporis: Secondary | ICD-10-CM | POA: Insufficient documentation

## 2008-11-03 DIAGNOSIS — N6089 Other benign mammary dysplasias of unspecified breast: Secondary | ICD-10-CM | POA: Insufficient documentation

## 2008-11-08 ENCOUNTER — Encounter (INDEPENDENT_AMBULATORY_CARE_PROVIDER_SITE_OTHER): Payer: Self-pay | Admitting: *Deleted

## 2008-11-10 ENCOUNTER — Telehealth (INDEPENDENT_AMBULATORY_CARE_PROVIDER_SITE_OTHER): Payer: Self-pay | Admitting: *Deleted

## 2008-11-10 ENCOUNTER — Encounter: Payer: Self-pay | Admitting: Family Medicine

## 2008-11-11 ENCOUNTER — Telehealth (INDEPENDENT_AMBULATORY_CARE_PROVIDER_SITE_OTHER): Payer: Self-pay | Admitting: *Deleted

## 2008-11-11 ENCOUNTER — Encounter (INDEPENDENT_AMBULATORY_CARE_PROVIDER_SITE_OTHER): Payer: Self-pay | Admitting: *Deleted

## 2008-11-11 ENCOUNTER — Encounter: Payer: Self-pay | Admitting: Family Medicine

## 2008-11-16 ENCOUNTER — Observation Stay (HOSPITAL_COMMUNITY): Admission: EM | Admit: 2008-11-16 | Discharge: 2008-11-17 | Payer: Self-pay | Admitting: Emergency Medicine

## 2008-11-17 ENCOUNTER — Ambulatory Visit: Payer: Self-pay | Admitting: Internal Medicine

## 2008-11-22 ENCOUNTER — Encounter: Payer: Self-pay | Admitting: Family Medicine

## 2008-11-25 ENCOUNTER — Ambulatory Visit: Payer: Self-pay | Admitting: Family Medicine

## 2008-11-25 DIAGNOSIS — N189 Chronic kidney disease, unspecified: Secondary | ICD-10-CM | POA: Insufficient documentation

## 2008-11-25 DIAGNOSIS — F329 Major depressive disorder, single episode, unspecified: Secondary | ICD-10-CM | POA: Insufficient documentation

## 2008-11-25 DIAGNOSIS — I480 Paroxysmal atrial fibrillation: Secondary | ICD-10-CM | POA: Insufficient documentation

## 2008-11-25 DIAGNOSIS — N179 Acute kidney failure, unspecified: Secondary | ICD-10-CM | POA: Insufficient documentation

## 2008-12-01 ENCOUNTER — Encounter: Payer: Self-pay | Admitting: Family Medicine

## 2008-12-06 ENCOUNTER — Ambulatory Visit (HOSPITAL_COMMUNITY): Admission: RE | Admit: 2008-12-06 | Discharge: 2008-12-06 | Payer: Self-pay | Admitting: Family Medicine

## 2008-12-07 ENCOUNTER — Encounter: Payer: Self-pay | Admitting: Family Medicine

## 2008-12-08 ENCOUNTER — Encounter: Payer: Self-pay | Admitting: Family Medicine

## 2008-12-23 ENCOUNTER — Encounter: Payer: Self-pay | Admitting: Family Medicine

## 2009-01-03 ENCOUNTER — Encounter: Admission: RE | Admit: 2009-01-03 | Discharge: 2009-01-03 | Payer: Self-pay | Admitting: Surgery

## 2009-01-07 ENCOUNTER — Ambulatory Visit (HOSPITAL_COMMUNITY): Admission: RE | Admit: 2009-01-07 | Discharge: 2009-01-07 | Payer: Self-pay | Admitting: Surgery

## 2009-01-07 IMAGING — CR DG CHEST 2V
2 series · 2 of 2 positions shown · non-contrast
Comparison: [DATE].  And [DATE]

CLINICAL DATA: Bariatric screening exam.  No current chest
complaints.  Nonsmoker

CHEST - 2 VIEW

[view not recorded (1 of 2)]
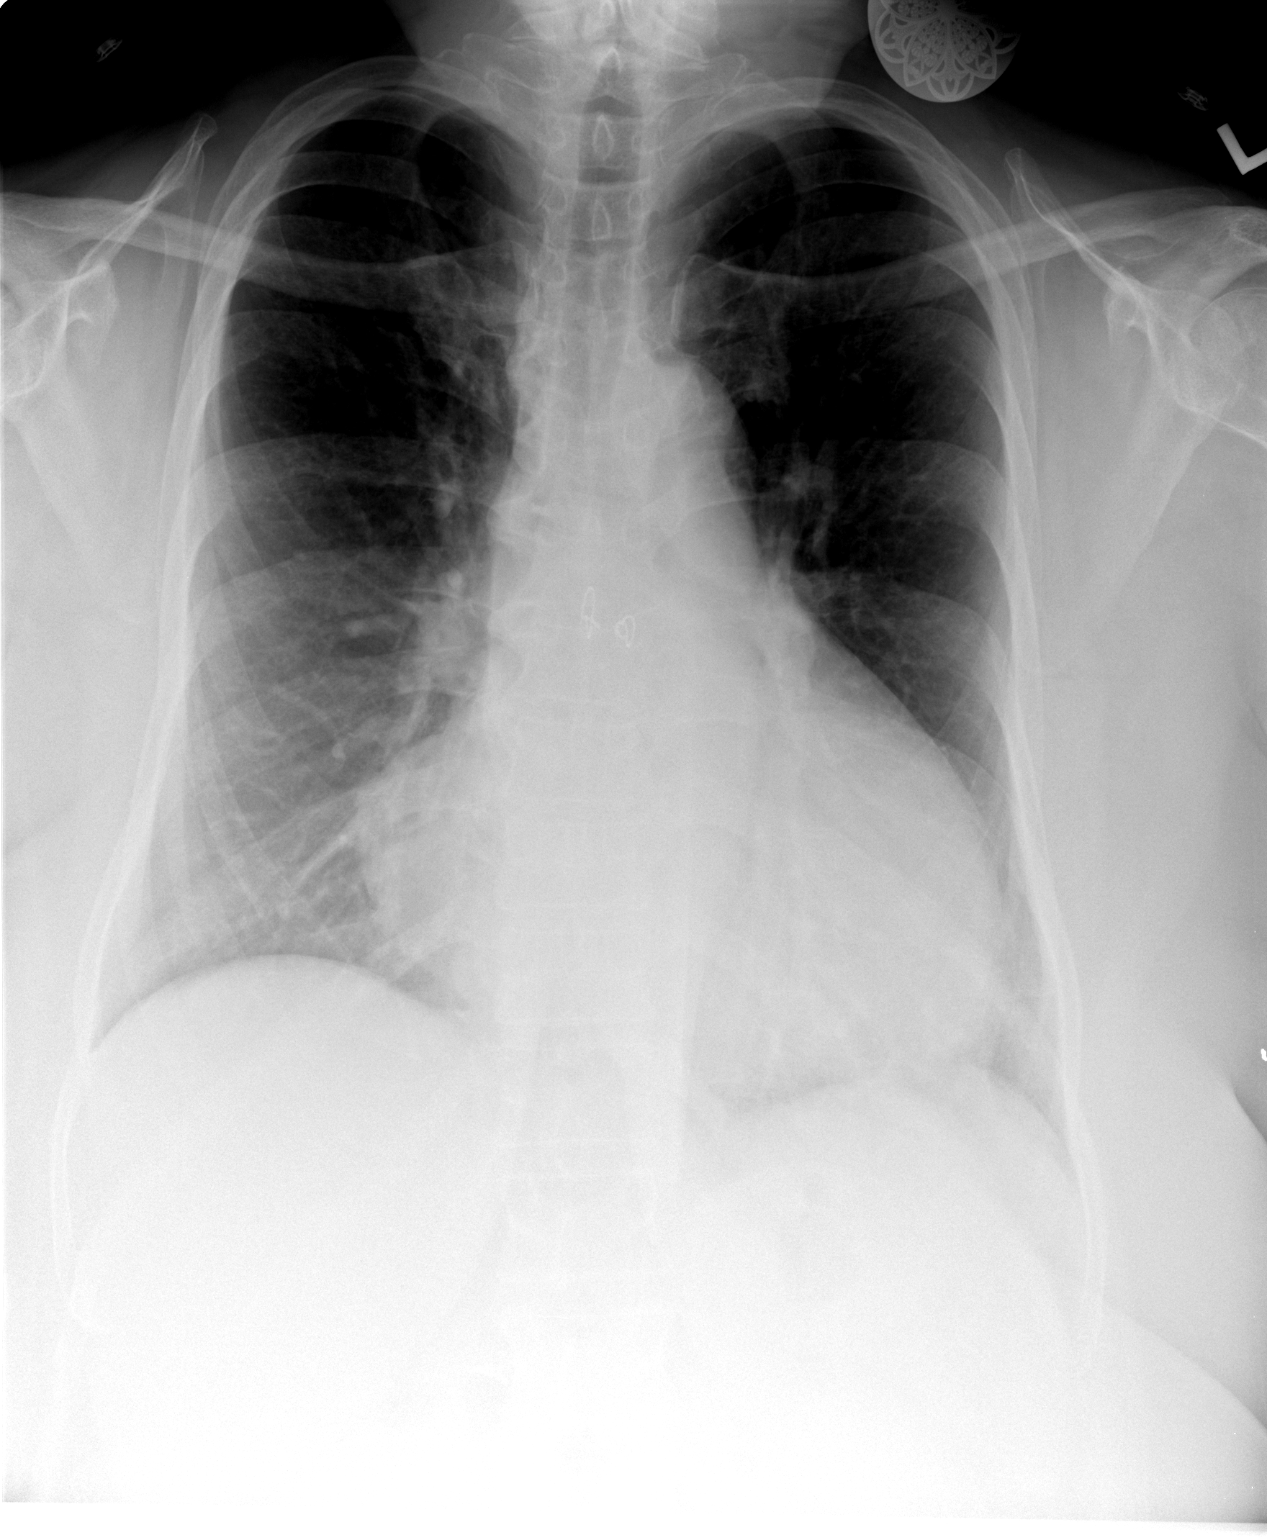

[view not recorded (2 of 2)]
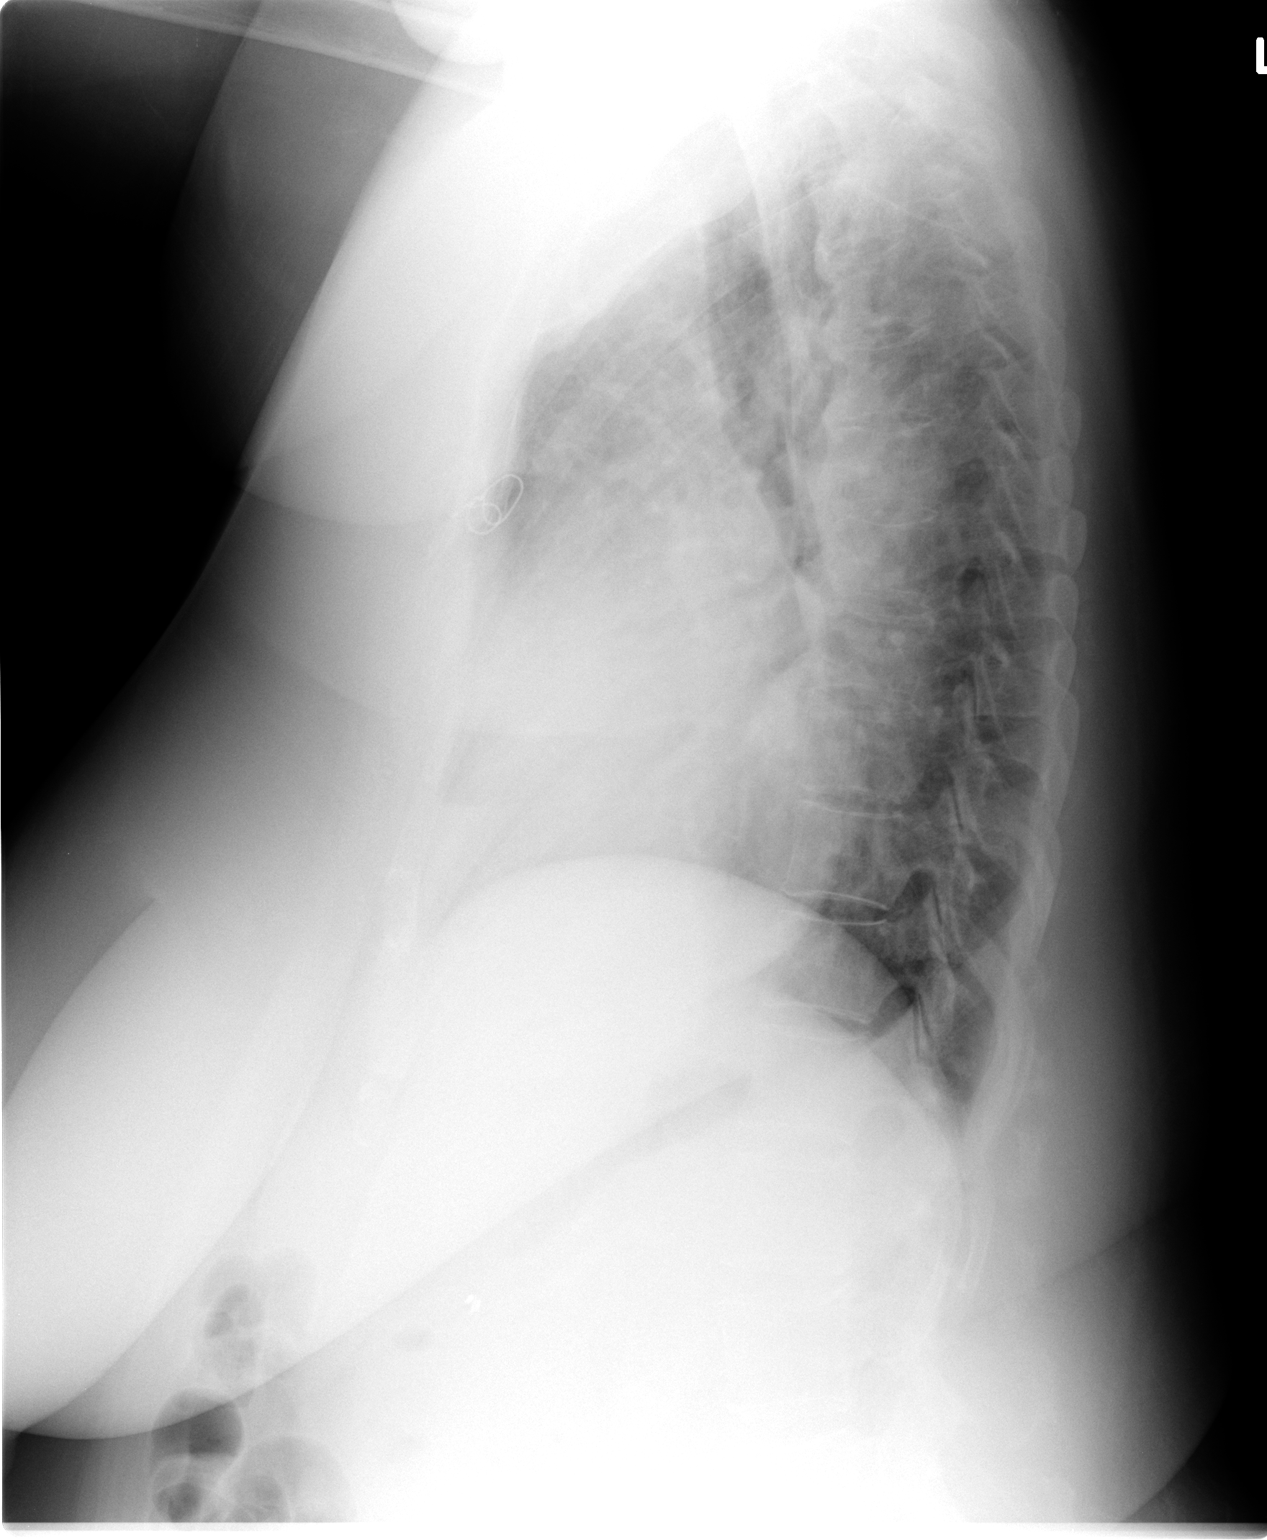

[2 of 2 positions shown; findings below may reference images not displayed]

FINDINGS: Heart size is moderately enlarged with left and right
ventricular enlargement suggested. This is unchanged in comparison
with the prior exams dating back to [0L].  The lung fields appear
clear with no signs of focal infiltrate or congestive failure.
Bony structures demonstrate degenerative changes of the upper and
mid thoracic spine.  Post cholecystectomy clips are identified in
the right upper quadrant.
IMPRESSION: Stable cardiac enlargement.  Clear lungs

## 2009-01-07 IMAGING — CR DG UGI W/ KUB
7 series · 7 of 7 positions shown · non-contrast
Comparison: None

CLINICAL DATA: Bariatric screening evaluation.  Status post
cholecystectomy.  No current problems

UPPER GI SERIES WITH KUB
TECHNIQUE: Routine upper GI series was performed with thin barium.
Fluoroscopy Time: 2.5 minutes

[view not recorded (1 of 7)]
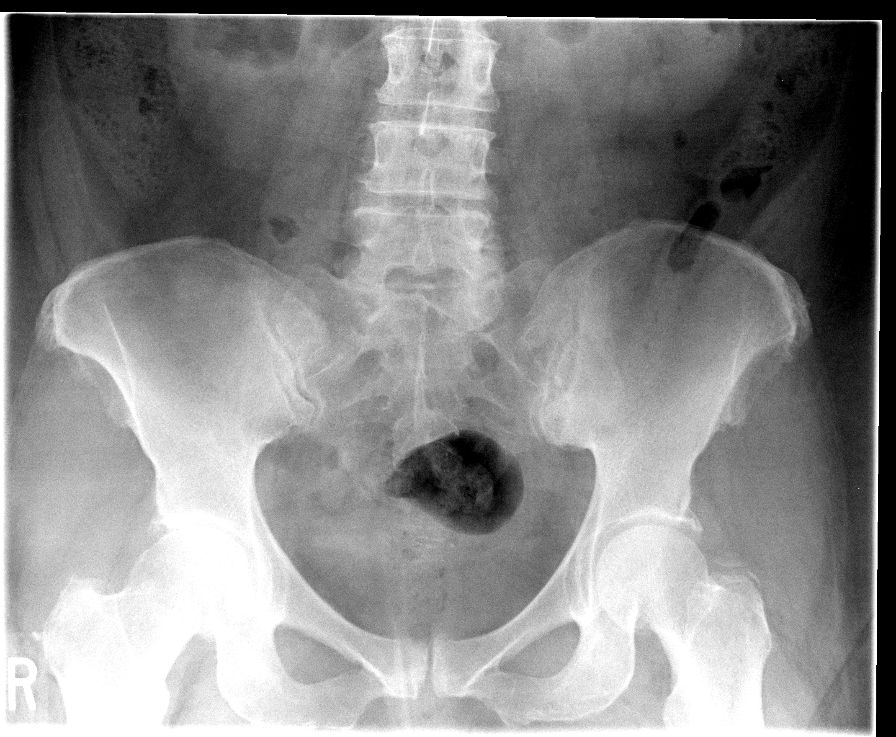

[view not recorded (2 of 7)]
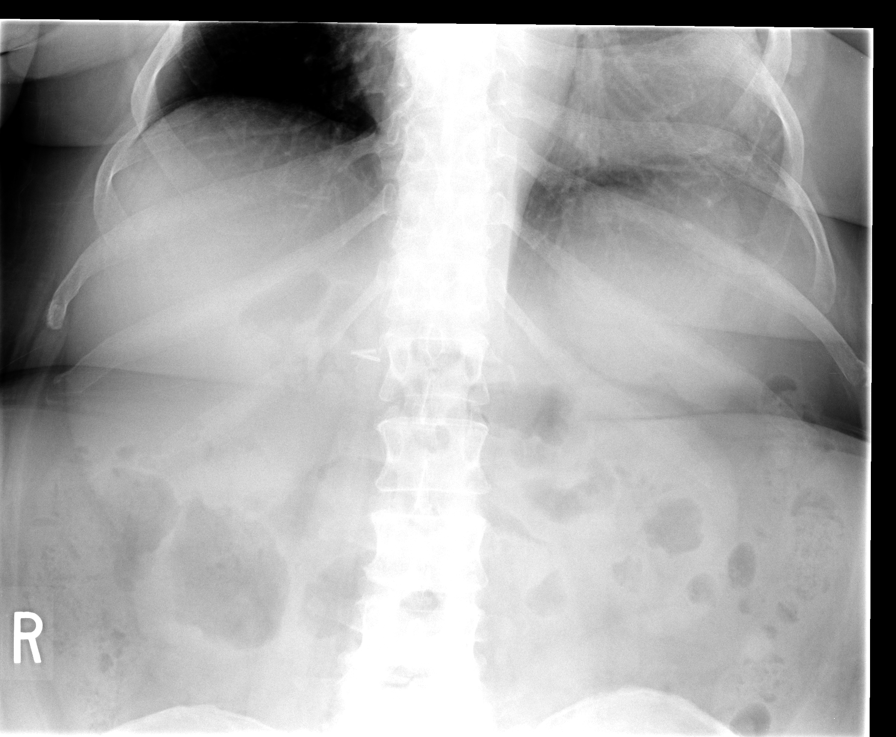

[view not recorded (3 of 7)]
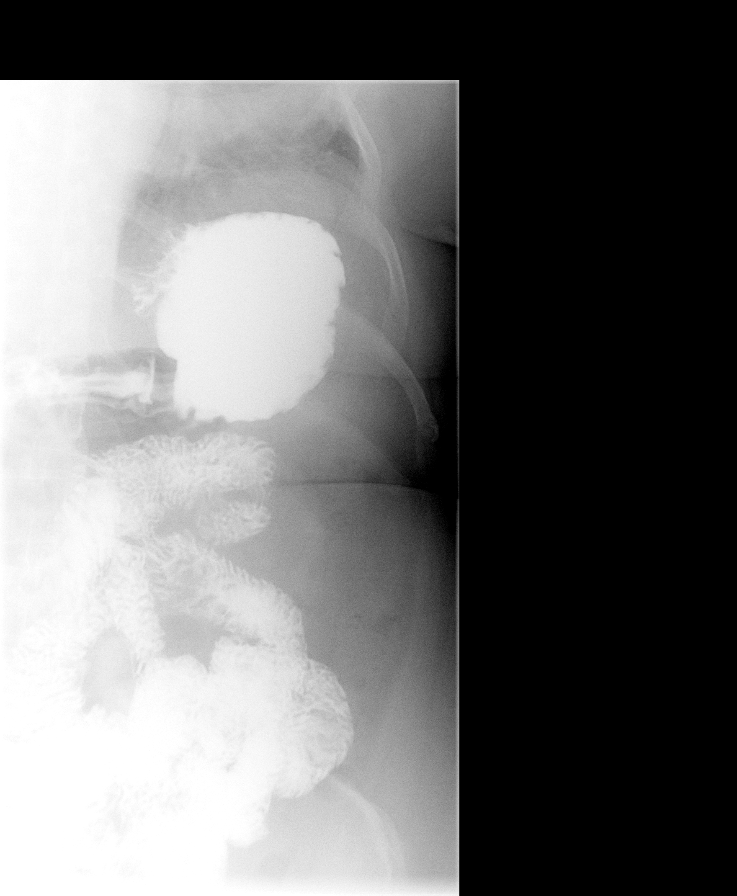

[view not recorded (4 of 7)]
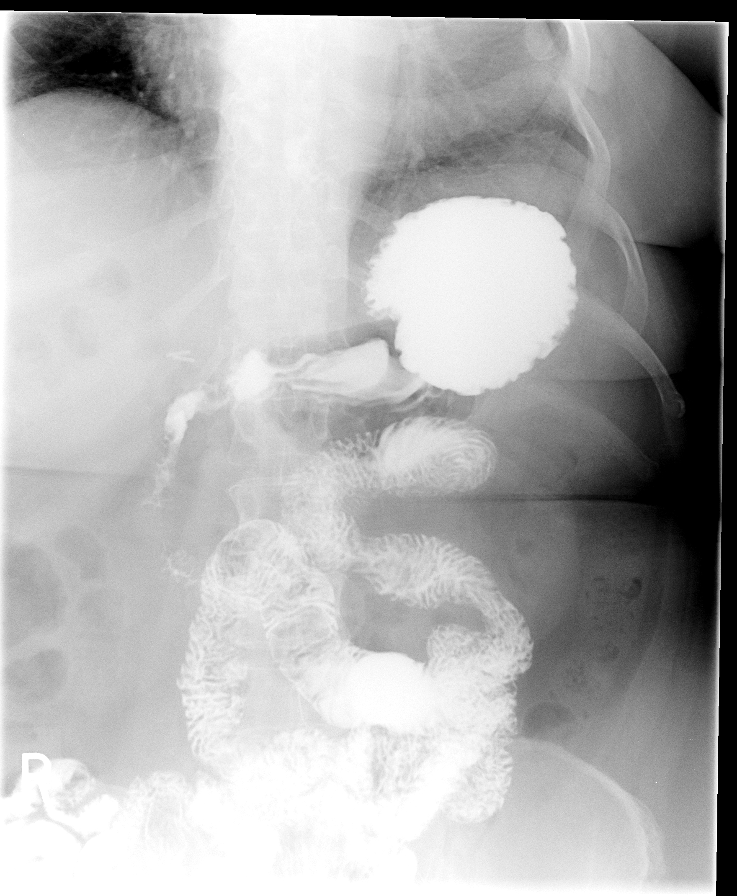

[view not recorded (5 of 7)]
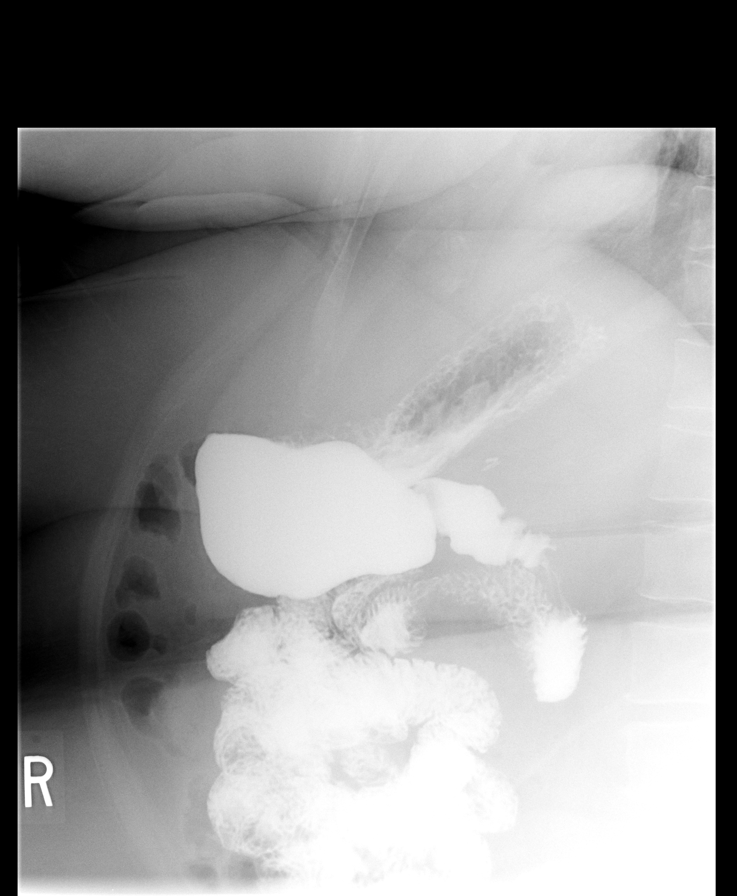

[view not recorded (6 of 7)]
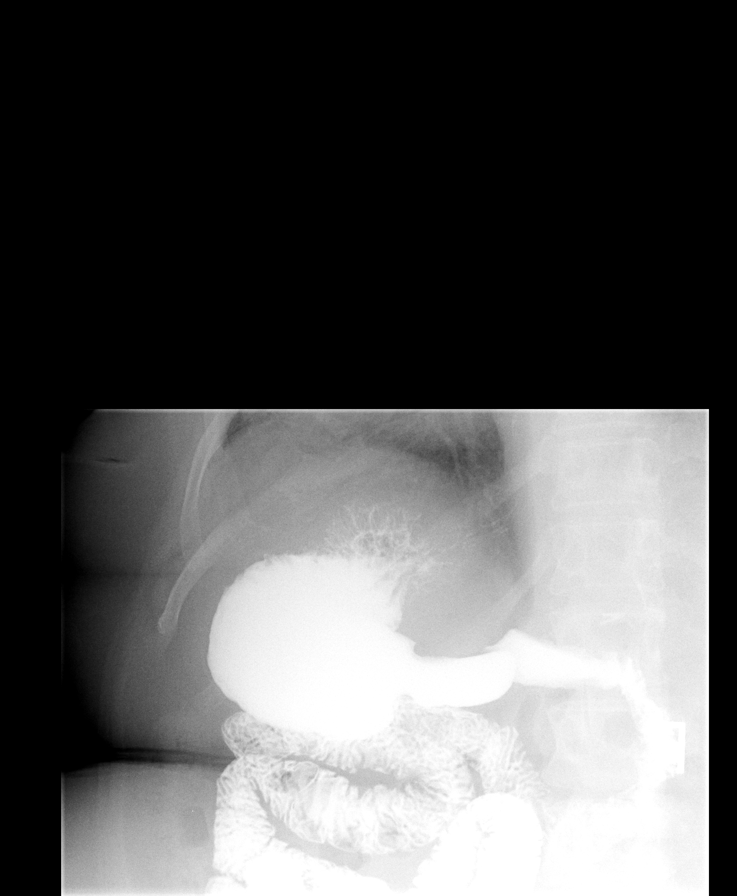

[view not recorded (7 of 7)]
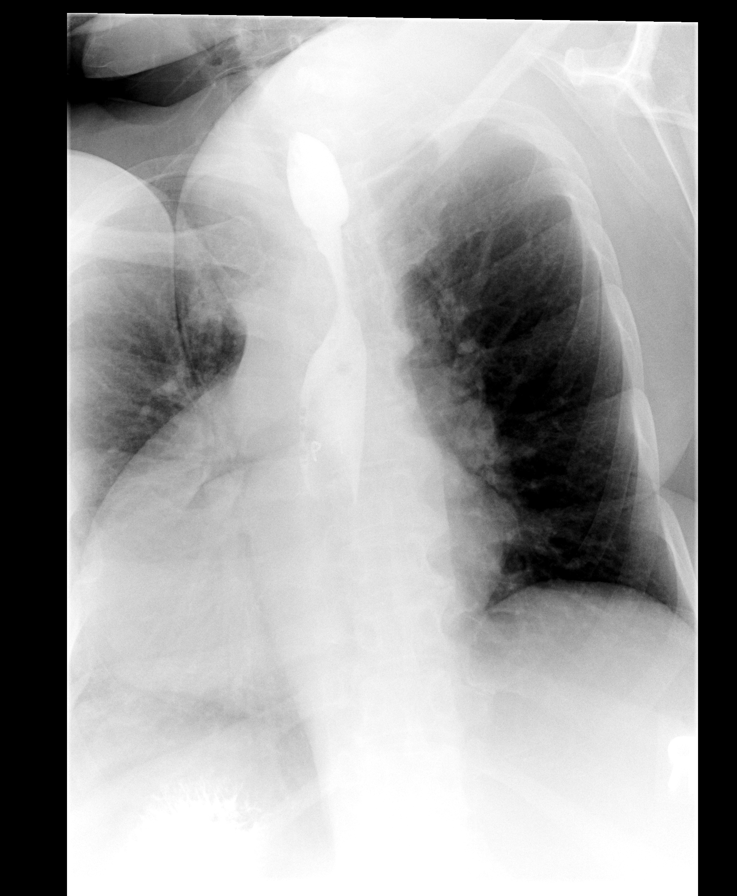

[7 of 7 positions shown; findings below may reference images not displayed]

FINDINGS: KUB:  Surgical clips are noted in the gallbladder fossa.
No abnormal calcifications are seen.  A nonobstructive bowel gas
pattern is noted.  Bony structures demonstrate some mild
degenerative changes of the lower lumbar spine and are otherwise
intact.

Upper GI:    Esophageal mucosa and motility appeared within normal
limits.  A small sliding variety hiatal hernia is evident.  No
signs of gastroesophageal reflux were seen even with elicitation
maneuvers.

Gastric mucosa and motility was normal.  The duodenal bulb was well
distended and shows no evidence for focal abnormality.  The
visualized portion of the small bowel to the level of the proximal
jejunum appeared normal.
IMPRESSION: Small sliding right hiatal hernia with no associated
gastroesophageal reflux noted.  Otherwise unremarkable upper GI.

## 2009-01-07 IMAGING — RF DG UGI W/ KUB
13 series · 13 of 13 positions shown · non-contrast
Comparison: None

CLINICAL DATA: Bariatric screening evaluation.  Status post
cholecystectomy.  No current problems

UPPER GI SERIES WITH KUB
TECHNIQUE: Routine upper GI series was performed with thin barium.
Fluoroscopy Time: 2.5 minutes

[Series 1: run · 1 of 1 slices shown (1 of 13)]
[im 1/1]
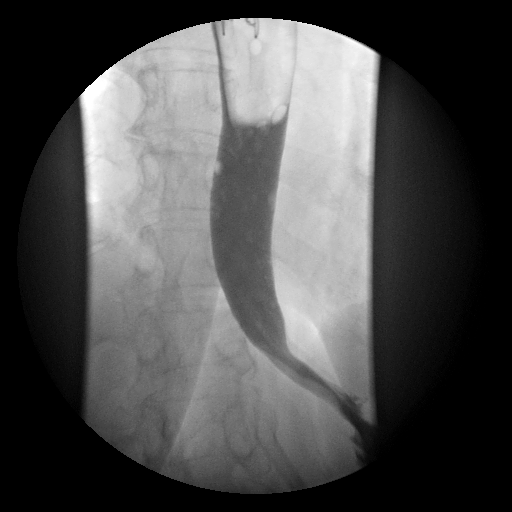

[Series 2: run · 1 of 1 slices shown (2 of 13)]
[im 1/1]
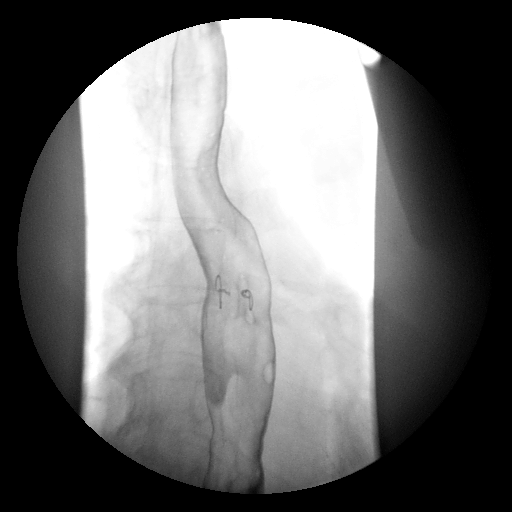

[Series 3: run · 1 of 1 slices shown (3 of 13)]
[im 1/1]
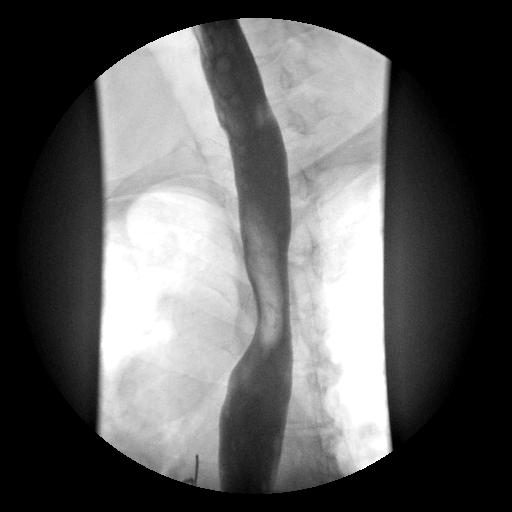

[Series 4: run · 1 of 1 slices shown (4 of 13)]
[im 1/1]
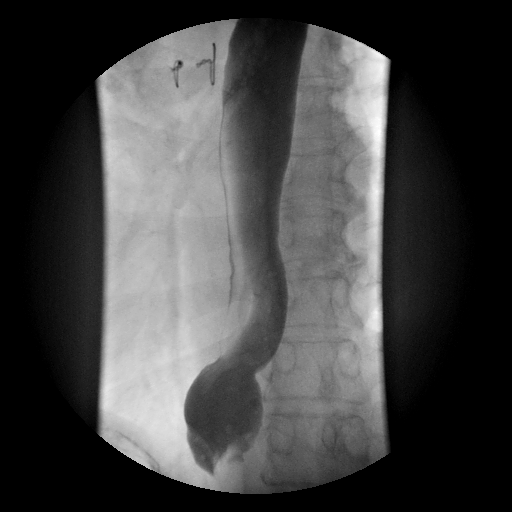

[Series 5: run · 1 of 1 slices shown (5 of 13)]
[im 1/1]
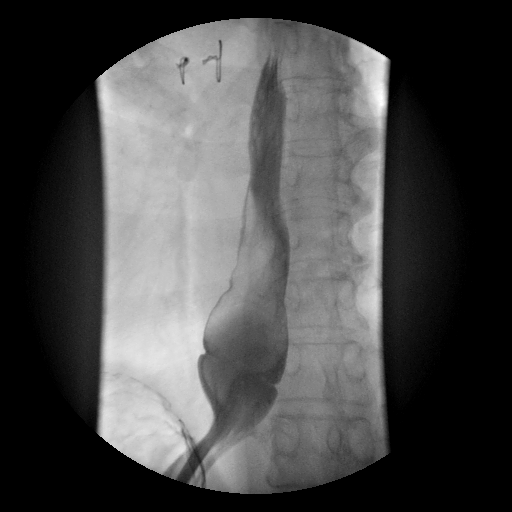

[Series 6: run · 1 of 1 slices shown (6 of 13)]
[im 1/1]
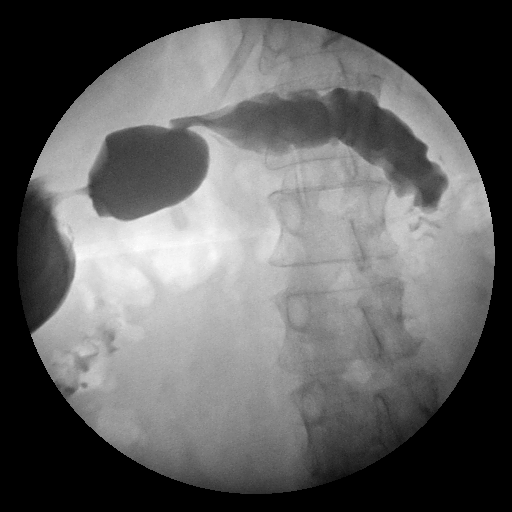

[Series 7: run · 1 of 1 slices shown (7 of 13)]
[im 1/1]
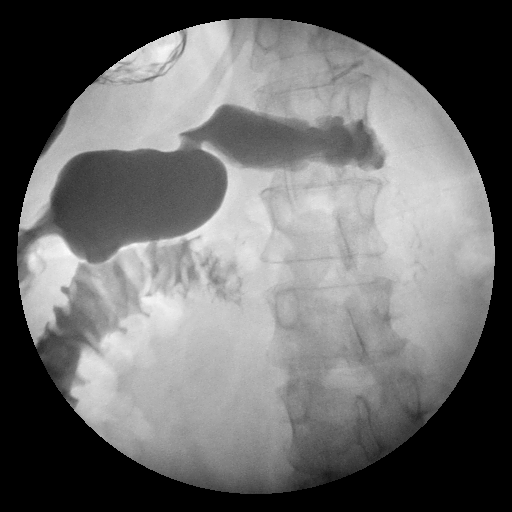

[Series 8: run · 1 of 1 slices shown (8 of 13)]
[im 1/1]
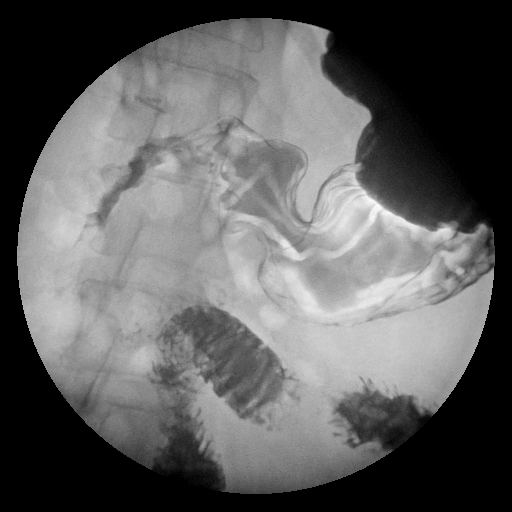

[Series 9: run · 1 of 1 slices shown (9 of 13)]
[im 1/1]
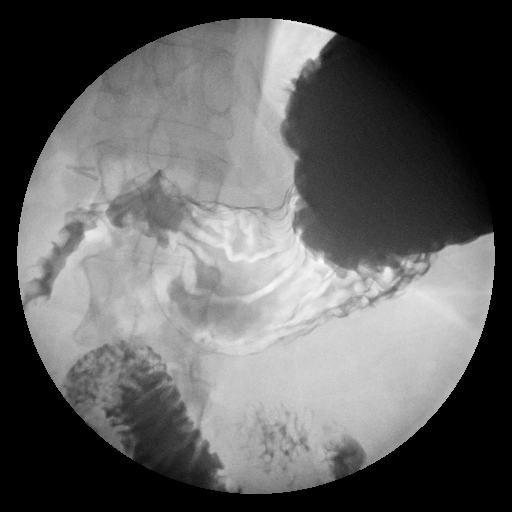

[Series 10: run · 1 of 1 slices shown (10 of 13)]
[im 1/1]
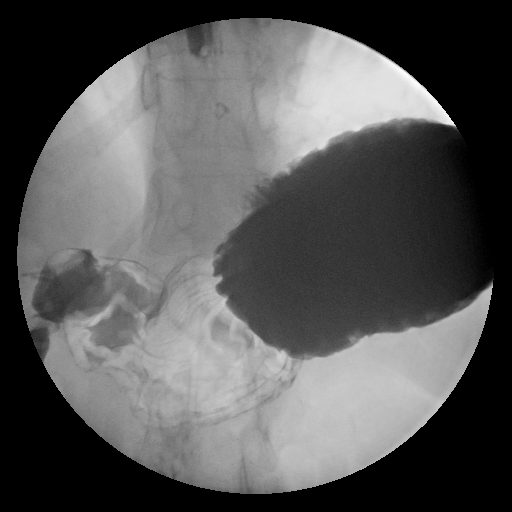

[Series 11: run · 1 of 1 slices shown (11 of 13)]
[im 1/1]
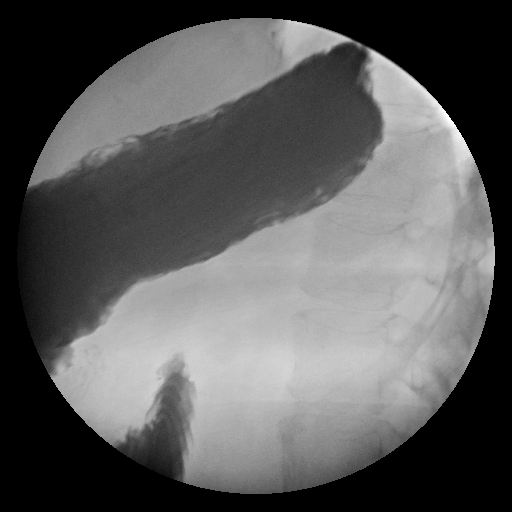

[Series 12: run · 1 of 1 slices shown (12 of 13)]
[im 1/1]
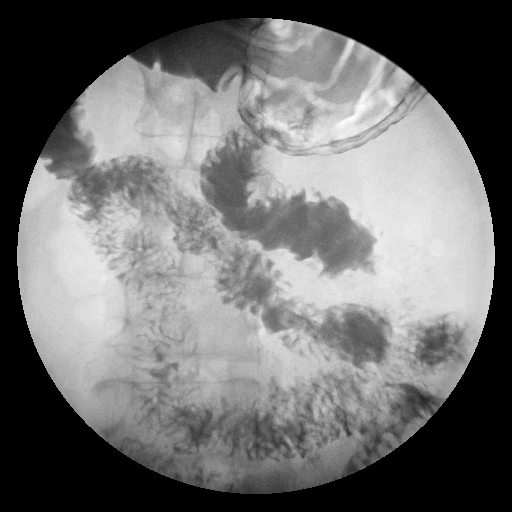

[Series 13: run · 1 of 1 slices shown (13 of 13)]
[im 1/1]
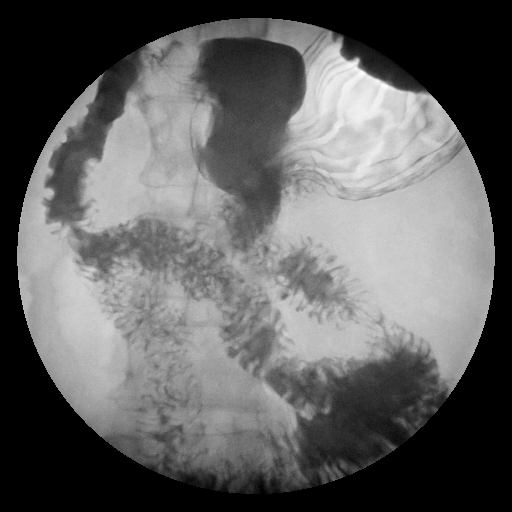

[13 of 13 positions shown; findings below may reference images not displayed]

FINDINGS: KUB:  Surgical clips are noted in the gallbladder fossa.
No abnormal calcifications are seen.  A nonobstructive bowel gas
pattern is noted.  Bony structures demonstrate some mild
degenerative changes of the lower lumbar spine and are otherwise
intact.

Upper GI:    Esophageal mucosa and motility appeared within normal
limits.  A small sliding variety hiatal hernia is evident.  No
signs of gastroesophageal reflux were seen even with elicitation
maneuvers.

Gastric mucosa and motility was normal.  The duodenal bulb was well
distended and shows no evidence for focal abnormality.  The
visualized portion of the small bowel to the level of the proximal
jejunum appeared normal.
IMPRESSION: Small sliding right hiatal hernia with no associated
gastroesophageal reflux noted.  Otherwise unremarkable upper GI.

## 2009-01-13 ENCOUNTER — Telehealth (INDEPENDENT_AMBULATORY_CARE_PROVIDER_SITE_OTHER): Payer: Self-pay | Admitting: *Deleted

## 2009-01-19 ENCOUNTER — Ambulatory Visit (HOSPITAL_COMMUNITY): Admission: RE | Admit: 2009-01-19 | Discharge: 2009-01-19 | Payer: Self-pay | Admitting: Surgery

## 2009-01-25 ENCOUNTER — Encounter: Payer: Self-pay | Admitting: Family Medicine

## 2009-01-25 ENCOUNTER — Telehealth (INDEPENDENT_AMBULATORY_CARE_PROVIDER_SITE_OTHER): Payer: Self-pay | Admitting: *Deleted

## 2009-01-27 ENCOUNTER — Encounter: Payer: Self-pay | Admitting: Family Medicine

## 2009-02-01 ENCOUNTER — Encounter: Payer: Self-pay | Admitting: Family Medicine

## 2009-02-14 ENCOUNTER — Ambulatory Visit: Payer: Self-pay | Admitting: Family Medicine

## 2009-02-15 ENCOUNTER — Encounter: Payer: Self-pay | Admitting: Family Medicine

## 2009-03-17 ENCOUNTER — Telehealth (INDEPENDENT_AMBULATORY_CARE_PROVIDER_SITE_OTHER): Payer: Self-pay | Admitting: *Deleted

## 2009-03-19 ENCOUNTER — Ambulatory Visit: Payer: Self-pay | Admitting: Family Medicine

## 2009-04-14 ENCOUNTER — Ambulatory Visit: Payer: Self-pay | Admitting: Family Medicine

## 2009-04-14 ENCOUNTER — Telehealth: Payer: Self-pay | Admitting: Family Medicine

## 2009-04-18 ENCOUNTER — Encounter: Payer: Self-pay | Admitting: Family Medicine

## 2009-04-28 ENCOUNTER — Encounter: Admission: RE | Admit: 2009-04-28 | Discharge: 2009-07-13 | Payer: Self-pay | Admitting: Surgery

## 2009-05-04 ENCOUNTER — Encounter: Payer: Self-pay | Admitting: Family Medicine

## 2009-05-05 ENCOUNTER — Encounter: Payer: Self-pay | Admitting: Family Medicine

## 2009-05-10 ENCOUNTER — Encounter: Payer: Self-pay | Admitting: Family Medicine

## 2009-05-11 ENCOUNTER — Ambulatory Visit: Payer: Self-pay | Admitting: Family Medicine

## 2009-05-11 ENCOUNTER — Telehealth (INDEPENDENT_AMBULATORY_CARE_PROVIDER_SITE_OTHER): Payer: Self-pay | Admitting: *Deleted

## 2009-05-12 ENCOUNTER — Encounter: Payer: Self-pay | Admitting: Family Medicine

## 2009-05-13 ENCOUNTER — Encounter: Payer: Self-pay | Admitting: Family Medicine

## 2009-05-16 ENCOUNTER — Ambulatory Visit (HOSPITAL_COMMUNITY): Admission: RE | Admit: 2009-05-16 | Discharge: 2009-05-17 | Payer: Self-pay | Admitting: Surgery

## 2009-05-17 IMAGING — CR DG UGI W/ GASTROGRAFIN
2 series · 2 of 2 positions shown · IV contrast (agent unspecified)
Comparison: [DATE]

CLINICAL DATA: Postop gastric banding.

WATER SOLUBLE UPPER GI SERIES
TECHNIQUE: Single-column upper GI series was performed using water
soluble contrast.
Fluoroscopy Time: 2.2 minutes.
Contrast: 20 ml [PS]

[view not recorded (1 of 2)]
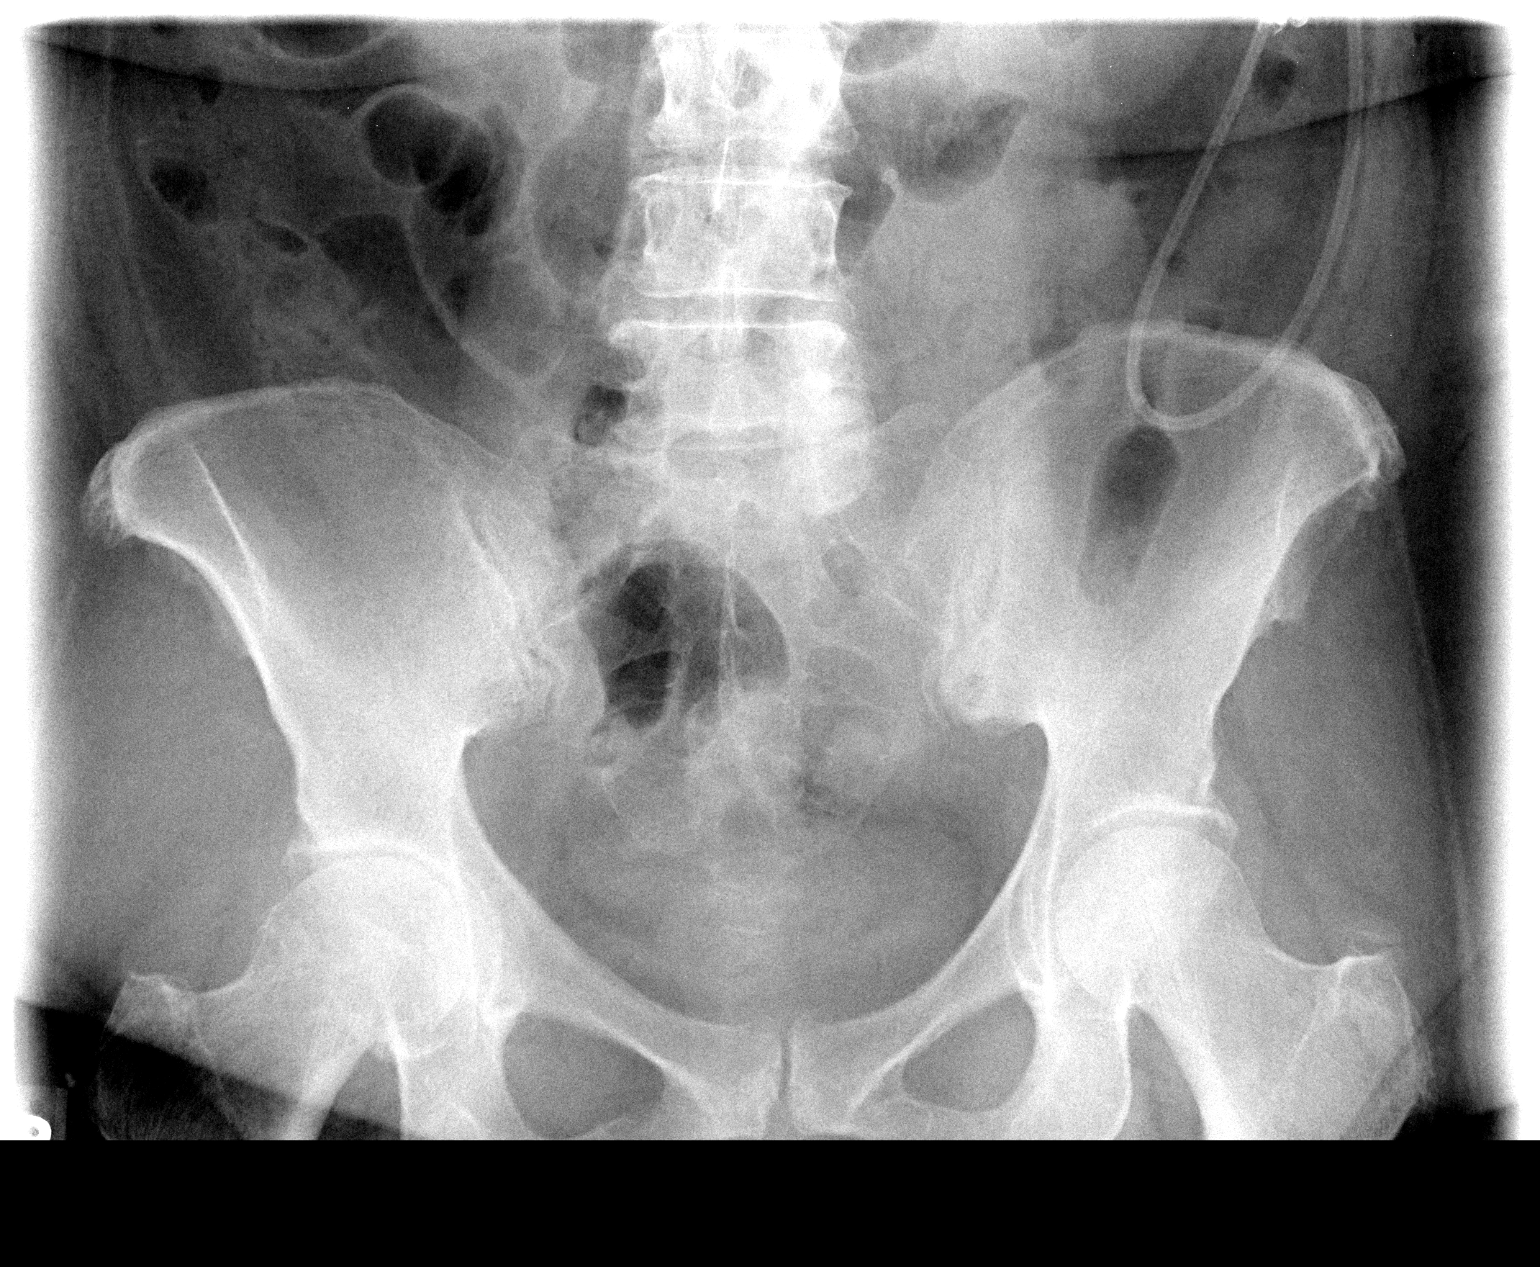

[view not recorded (2 of 2)]
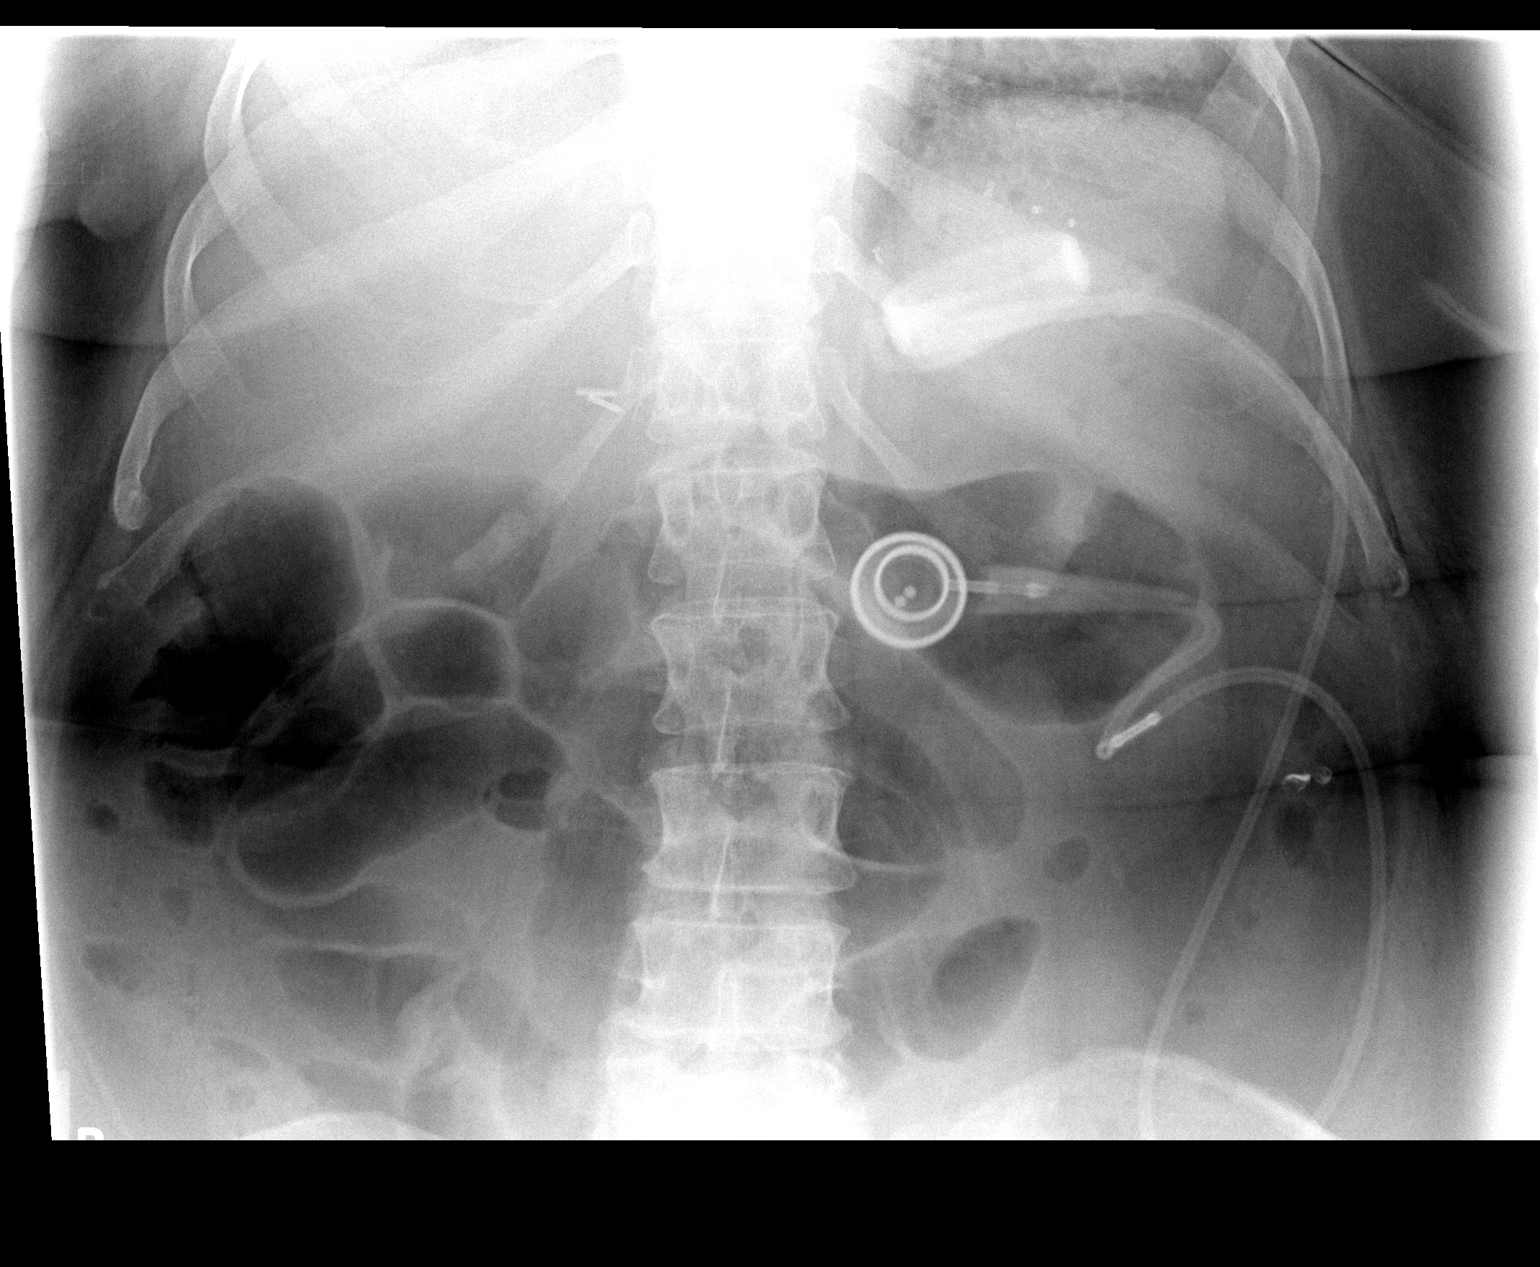

[2 of 2 positions shown; findings below may reference images not displayed]

FINDINGS: The patient is status post fundoplication and gastric
banding.  The gastric band device is in the [DATE] to [DATE]
orientation.  Upon swallowing the water soluble contrast, contrast
abruptly passes into the stomach.  No evidence of obstruction or
leakage.  Previously noted hiatal hernia not no longer visualized.
IMPRESSION: Postoperative appearance as above.  No evidence of obstruction or
leakage.

## 2009-05-17 IMAGING — RF DG UGI W/ GASTROGRAFIN
13 series · 14 of 14 positions shown · IV contrast (agent unspecified)
Comparison: [DATE]

CLINICAL DATA: Postop gastric banding.

WATER SOLUBLE UPPER GI SERIES
TECHNIQUE: Single-column upper GI series was performed using water
soluble contrast.
Fluoroscopy Time: 2.2 minutes.
Contrast: 20 ml [PS]

[Series 1: run · 1 of 1 slices shown (1 of 13)]
[im 1/1]
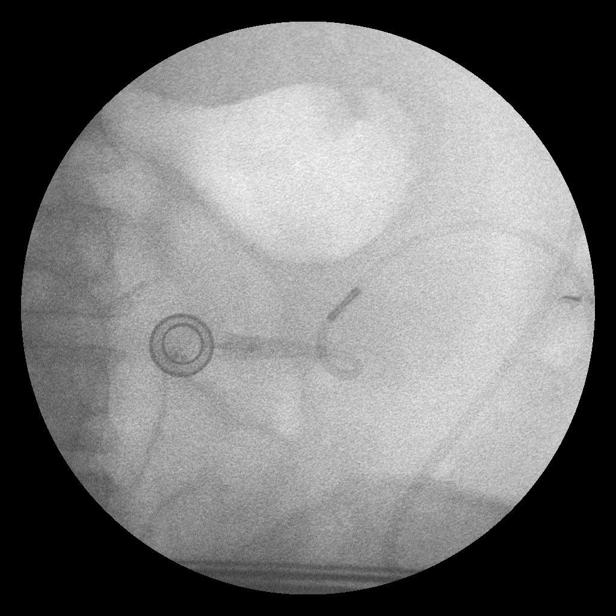

[Series 2: run · 1 of 1 slices shown (2 of 13)]
[im 1/1]
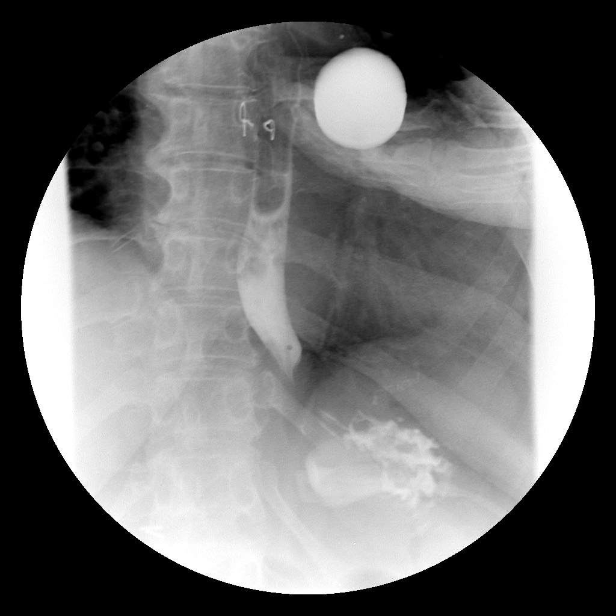

[Series 3: run · 1 of 1 slices shown (3 of 13)]
[im 1/1]
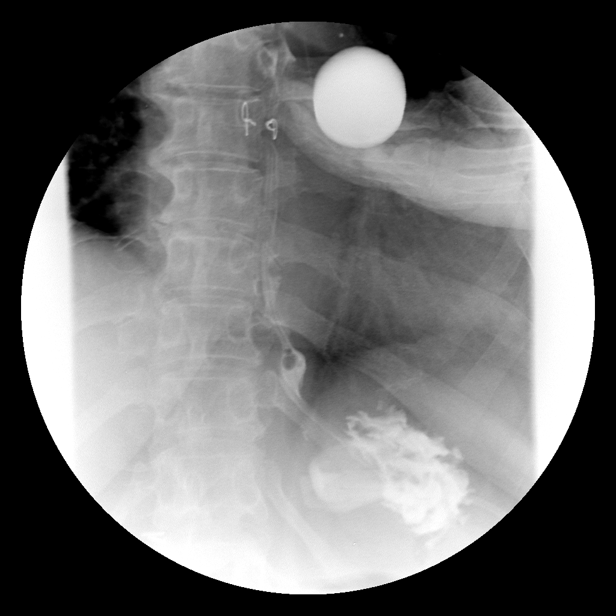

[Series 4: run · 1 of 1 slices shown (4 of 13)]
[im 1/1]
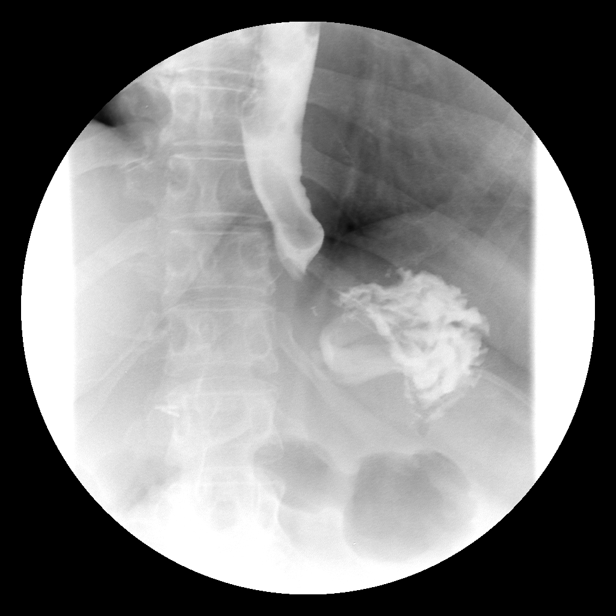

[Series 5: run · 1 of 1 slices shown (5 of 13)]
[im 1/1]
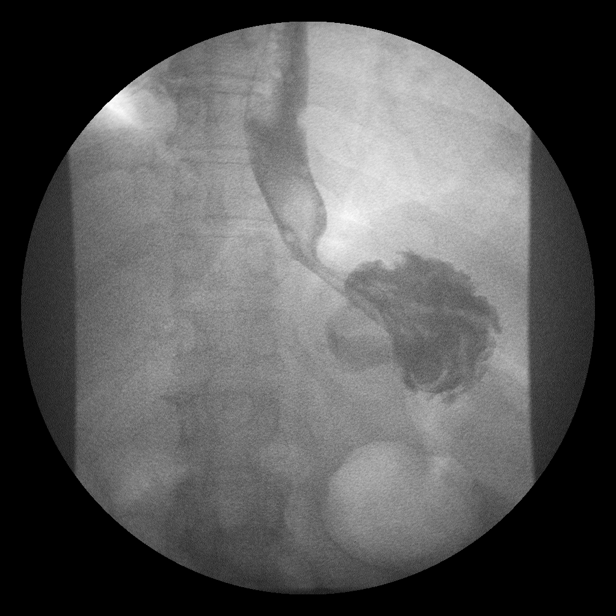

[Series 6: run · 1 of 1 slices shown (6 of 13)]
[im 1/1]
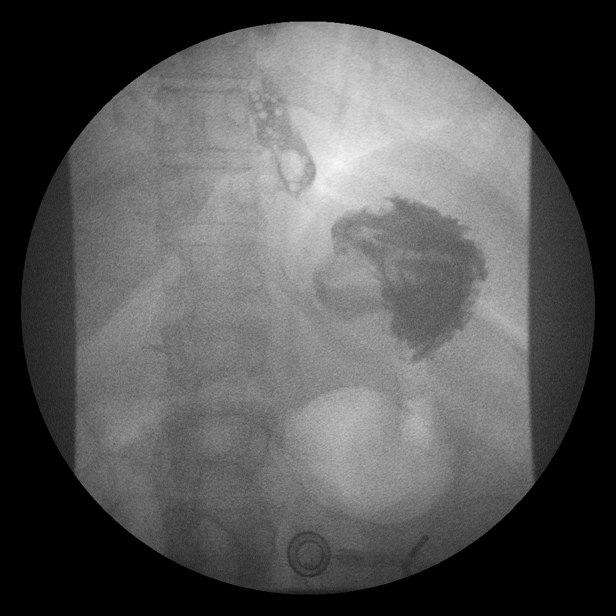

[Series 7: run · 1 of 1 slices shown (7 of 13)]
[im 1/1]
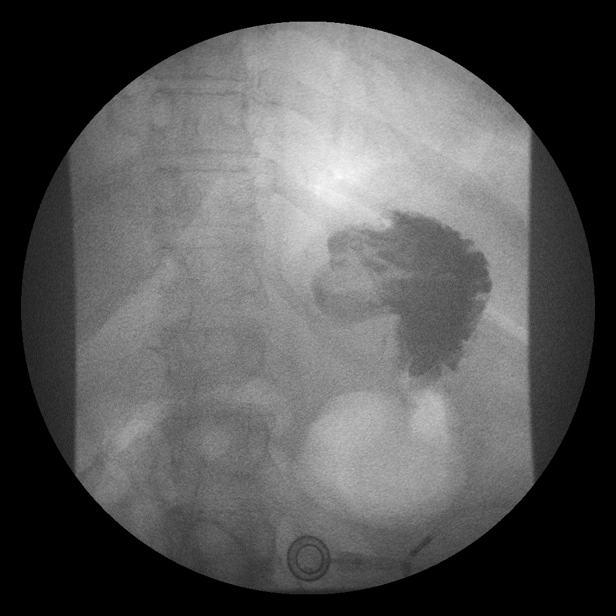

[Series 8: run · 1 of 1 slices shown (8 of 13)]
[im 1/1]
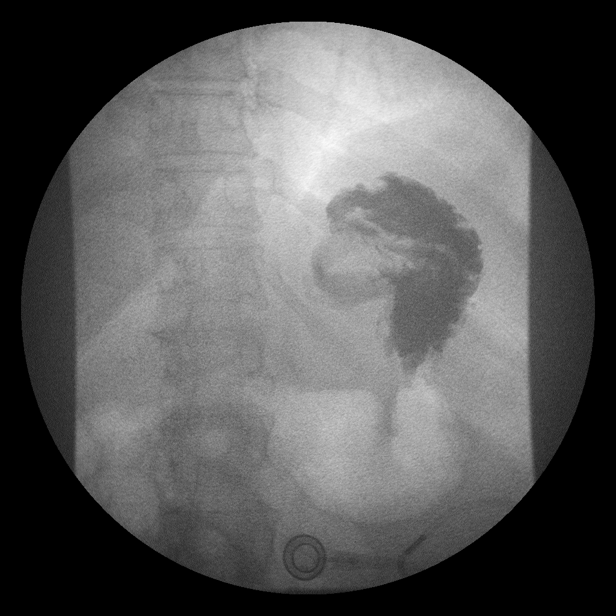

[Series 9: run · 1 of 1 slices shown (9 of 13)]
[im 1/1]
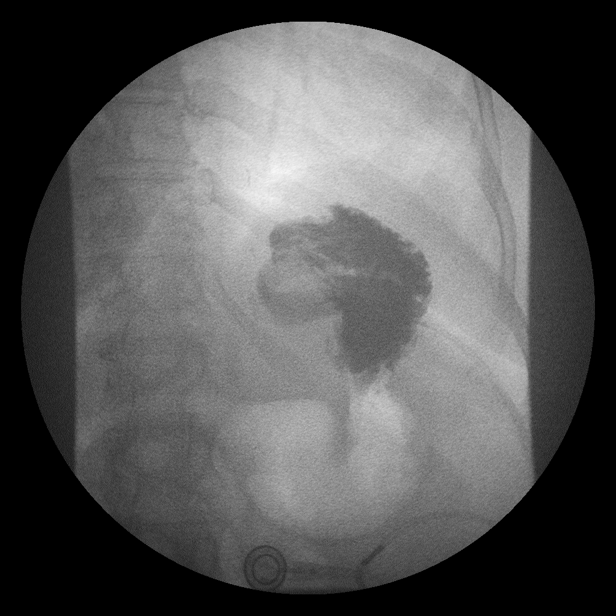

[Series 10: run · 2 of 2 slices shown (10 of 13)]
[im 1/2]
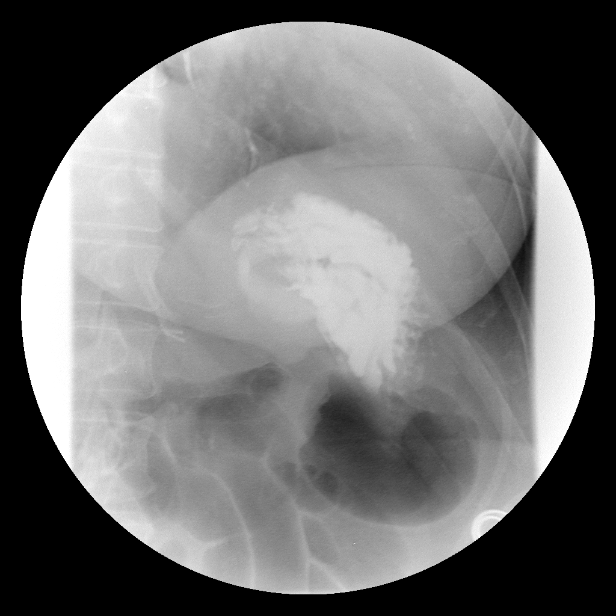
[im 2/2]
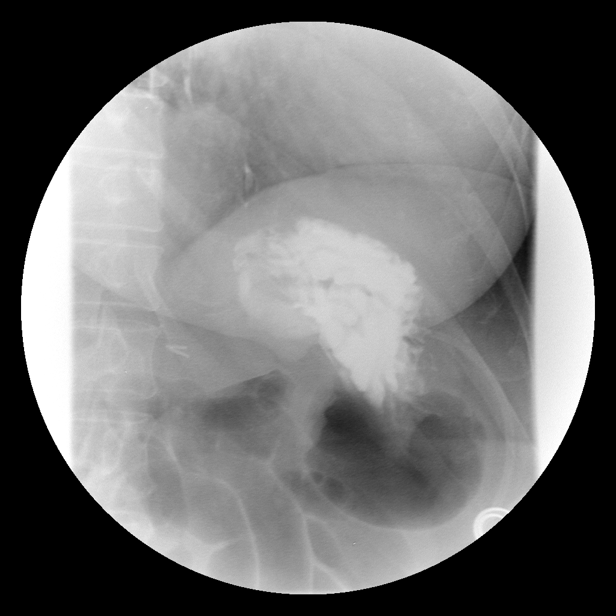

[Series 11: run · 1 of 1 slices shown (11 of 13)]
[im 1/1]
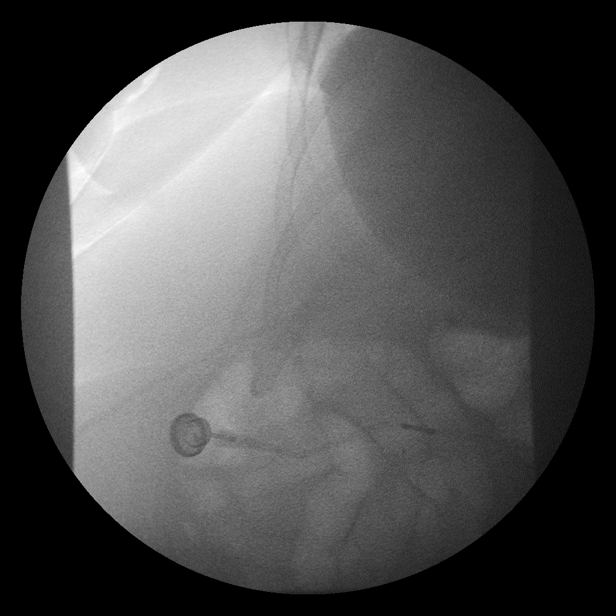

[Series 12: run · 1 of 1 slices shown (12 of 13)]
[im 1/1]
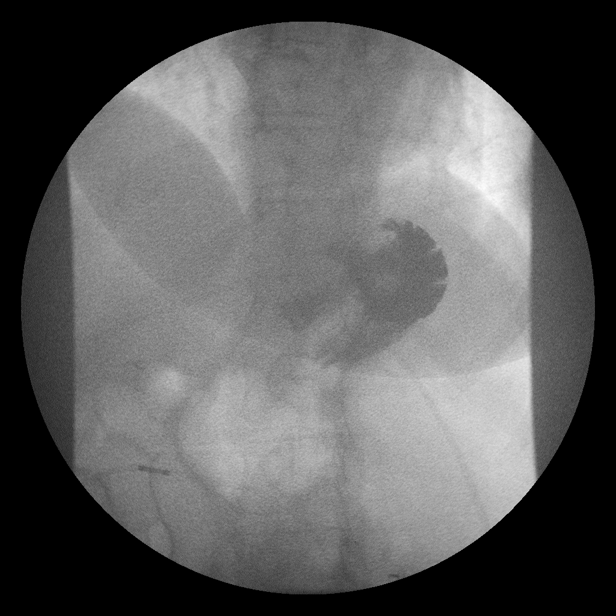

[Series 13: run · 1 of 1 slices shown (13 of 13)]
[im 1/1]
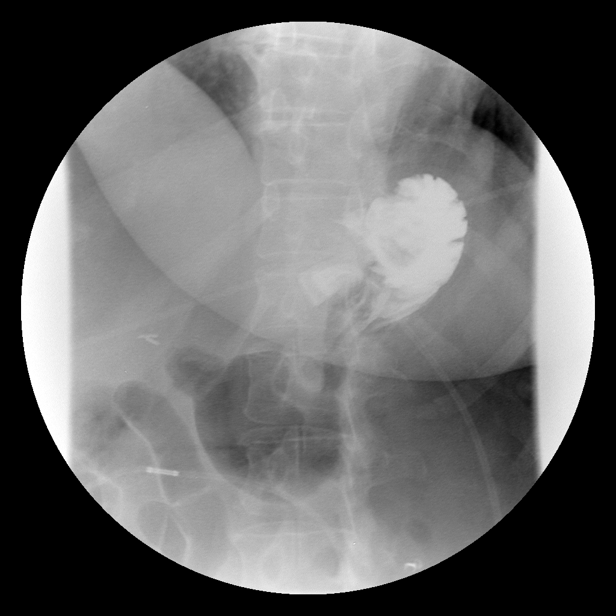

[14 of 14 positions shown; findings below may reference images not displayed]

FINDINGS: The patient is status post fundoplication and gastric
banding.  The gastric band device is in the [DATE] to [DATE]
orientation.  Upon swallowing the water soluble contrast, contrast
abruptly passes into the stomach.  No evidence of obstruction or
leakage.  Previously noted hiatal hernia not no longer visualized.
IMPRESSION: Postoperative appearance as above.  No evidence of obstruction or
leakage.

## 2009-05-18 ENCOUNTER — Encounter: Payer: Self-pay | Admitting: Family Medicine

## 2009-05-31 ENCOUNTER — Encounter: Payer: Self-pay | Admitting: Family Medicine

## 2009-06-13 ENCOUNTER — Telehealth: Payer: Self-pay | Admitting: Family Medicine

## 2009-06-14 ENCOUNTER — Ambulatory Visit: Payer: Self-pay | Admitting: Family Medicine

## 2009-06-21 ENCOUNTER — Encounter: Payer: Self-pay | Admitting: Family Medicine

## 2009-07-21 ENCOUNTER — Encounter: Admission: RE | Admit: 2009-07-21 | Discharge: 2009-10-19 | Payer: Self-pay | Admitting: Surgery

## 2009-07-25 ENCOUNTER — Encounter: Payer: Self-pay | Admitting: Family Medicine

## 2009-08-16 ENCOUNTER — Ambulatory Visit: Payer: Self-pay | Admitting: Family Medicine

## 2009-08-17 ENCOUNTER — Telehealth (INDEPENDENT_AMBULATORY_CARE_PROVIDER_SITE_OTHER): Payer: Self-pay | Admitting: *Deleted

## 2009-09-15 ENCOUNTER — Ambulatory Visit: Payer: Self-pay | Admitting: Family Medicine

## 2009-09-21 ENCOUNTER — Encounter: Payer: Self-pay | Admitting: Family Medicine

## 2009-10-04 ENCOUNTER — Ambulatory Visit: Payer: Self-pay | Admitting: Family Medicine

## 2009-10-04 ENCOUNTER — Observation Stay (HOSPITAL_COMMUNITY): Admission: EM | Admit: 2009-10-04 | Discharge: 2009-10-06 | Payer: Self-pay | Admitting: Emergency Medicine

## 2009-10-04 ENCOUNTER — Ambulatory Visit: Payer: Self-pay | Admitting: Cardiovascular Disease

## 2009-10-04 ENCOUNTER — Emergency Department (HOSPITAL_COMMUNITY): Admission: EM | Admit: 2009-10-04 | Discharge: 2009-10-04 | Payer: Self-pay | Admitting: Emergency Medicine

## 2009-10-04 DIAGNOSIS — R109 Unspecified abdominal pain: Secondary | ICD-10-CM | POA: Insufficient documentation

## 2009-10-04 DIAGNOSIS — R112 Nausea with vomiting, unspecified: Secondary | ICD-10-CM | POA: Insufficient documentation

## 2009-10-04 LAB — CONVERTED CEMR LAB
ALT: 17 units/L (ref 0–35)
AST: 17 units/L (ref 0–37)
Albumin: 3.8 g/dL (ref 3.5–5.2)
Alkaline Phosphatase: 88 units/L (ref 39–117)
BUN: 45 mg/dL — ABNORMAL HIGH (ref 6–23)
Basophils Absolute: 0 10*3/uL (ref 0.0–0.1)
Basophils Relative: 0.2 % (ref 0.0–3.0)
Bilirubin Urine: NEGATIVE
Bilirubin, Direct: 0 mg/dL (ref 0.0–0.3)
Blood in Urine, dipstick: NEGATIVE
CO2: 32 meq/L (ref 19–32)
Calcium: 9.7 mg/dL (ref 8.4–10.5)
Chloride: 102 meq/L (ref 96–112)
Creatinine, Ser: 3.3 mg/dL — ABNORMAL HIGH (ref 0.4–1.2)
Eosinophils Absolute: 0 10*3/uL (ref 0.0–0.7)
Eosinophils Relative: 0.2 % (ref 0.0–5.0)
GFR calc non Af Amer: 18.26 mL/min (ref >60)
Glucose, Bld: 120 mg/dL — ABNORMAL HIGH (ref 70–99)
Glucose, Urine, Semiquant: NEGATIVE
HCT: 38.7 % (ref 36.0–46.0)
Hemoglobin: 12.3 g/dL (ref 12.0–15.0)
Ketones, urine, test strip: NEGATIVE
Lymphocytes Relative: 16.7 % (ref 12.0–46.0)
Lymphs Abs: 2 10*3/uL (ref 0.7–4.0)
MCHC: 31.7 g/dL (ref 30.0–36.0)
MCV: 88.3 fL (ref 78.0–100.0)
Monocytes Absolute: 1.1 10*3/uL — ABNORMAL HIGH (ref 0.1–1.0)
Monocytes Relative: 9.2 % (ref 3.0–12.0)
Neutro Abs: 9 10*3/uL — ABNORMAL HIGH (ref 1.4–7.7)
Neutrophils Relative %: 73.7 % (ref 43.0–77.0)
Nitrite: NEGATIVE
Platelets: 261 10*3/uL (ref 150.0–400.0)
Potassium: 4.2 meq/L (ref 3.5–5.1)
Protein, U semiquant: 100
RBC: 4.39 M/uL (ref 3.87–5.11)
RDW: 15.3 % — ABNORMAL HIGH (ref 11.5–14.6)
Sodium: 146 meq/L — ABNORMAL HIGH (ref 135–145)
Specific Gravity, Urine: >=1.03
Total Bilirubin: 0.4 mg/dL (ref 0.3–1.2)
Total Protein: 7.8 g/dL (ref 6.0–8.3)
Urobilinogen, UA: 0.2
WBC Urine, dipstick: NEGATIVE
WBC: 12.1 10*3/uL — ABNORMAL HIGH (ref 4.5–10.5)
pH: 6

## 2009-10-04 IMAGING — RF DG UGI W/ GASTROGRAFIN
9 of 10 series · 20 of 24 positions shown · IV contrast (omnipaque)
Comparison: CT scan today.  [DATE] upper GI.

CLINICAL DATA: Post gastric banding in [REDACTED].  The patient unable
to eat or drink.

WATER SOLUBLE UPPER GI SERIES
TECHNIQUE: Single-column upper GI series was performed using water
soluble contrast.
Fluoroscopy Time: 0.7 minutes
Contrast: Omnipaque 300.

[Series 1: run · 2 of 3 slices shown (1 of 9)]
[im 1/3]
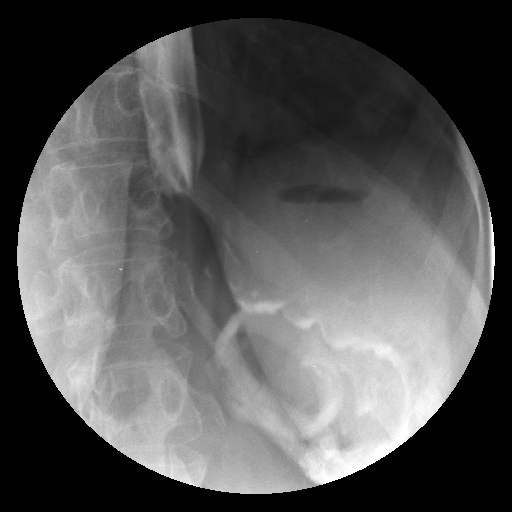
[im 3/3]
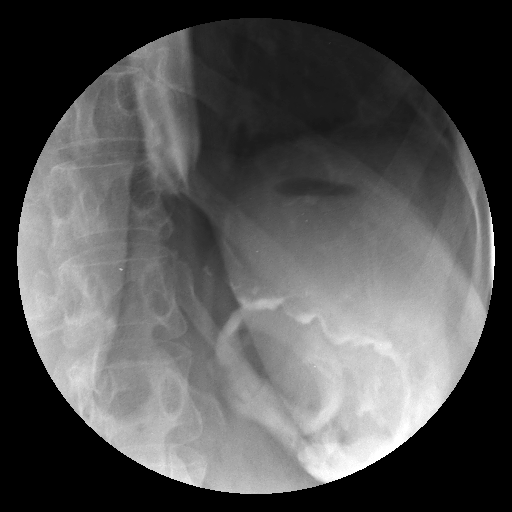

[Series 3: run · 3 of 3 slices shown (2 of 9)]
[im 1/3]
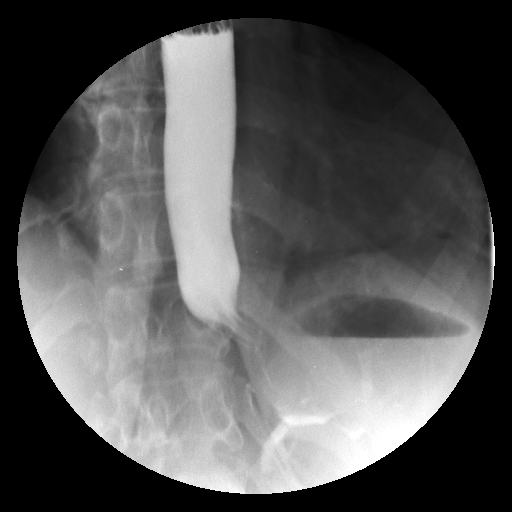
[im 2/3]
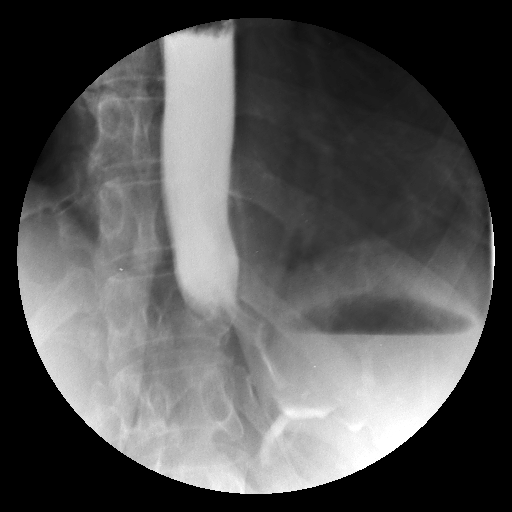
[im 3/3]
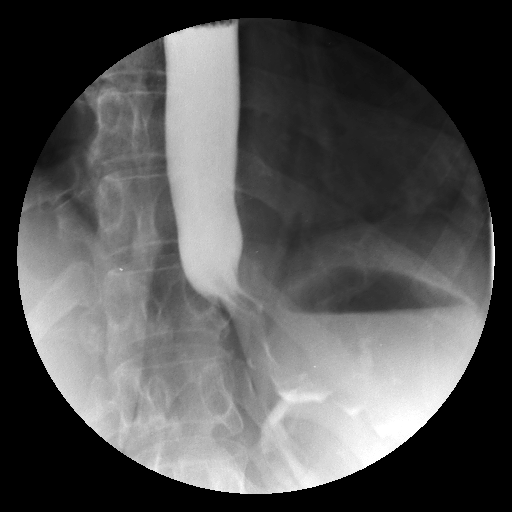

[Series 4: run · 2 of 2 slices shown (3 of 9)]
[im 1/2]
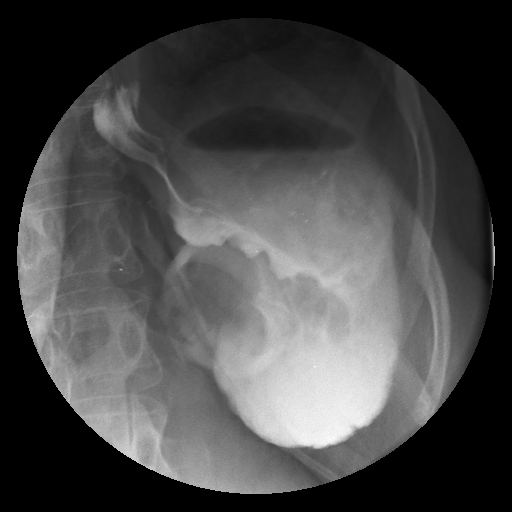
[im 2/2]
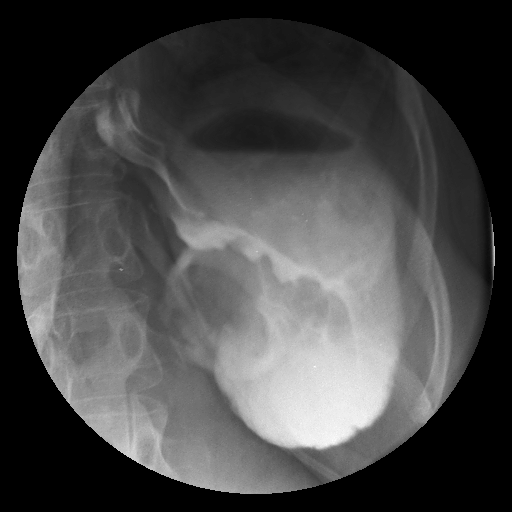

[Series 5: run · 4 of 5 slices shown (4 of 9)]
[im 2/5]
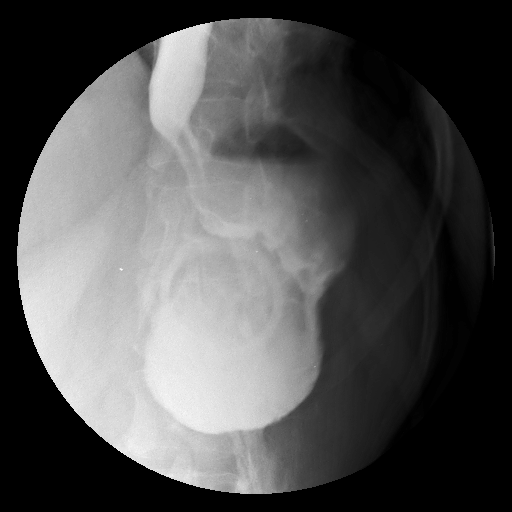
[im 3/5]
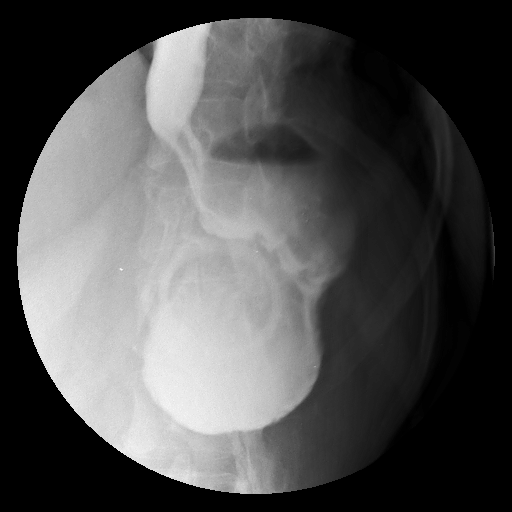
[im 4/5]
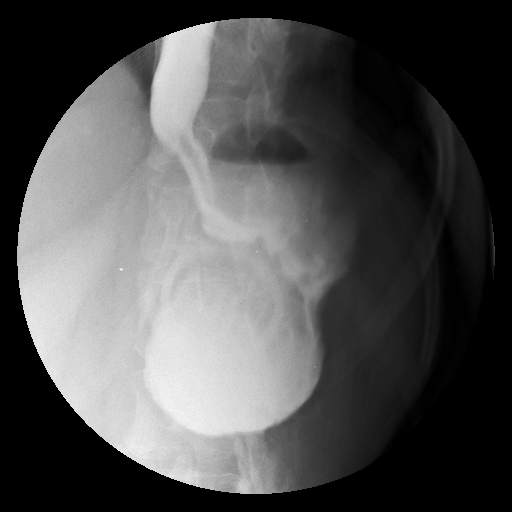
[im 5/5]
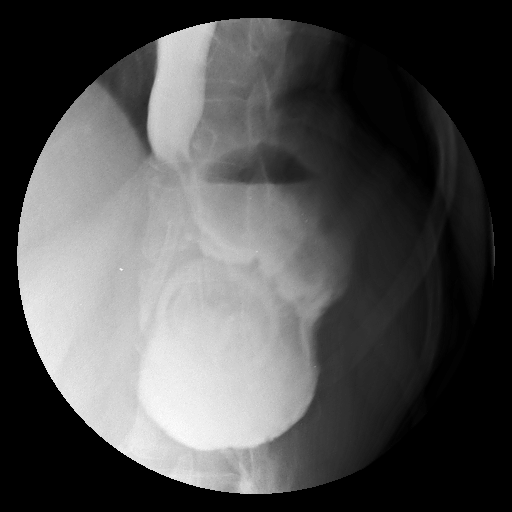

[Series 6: run · 2 of 3 slices shown (5 of 9)]
[im 1/3]
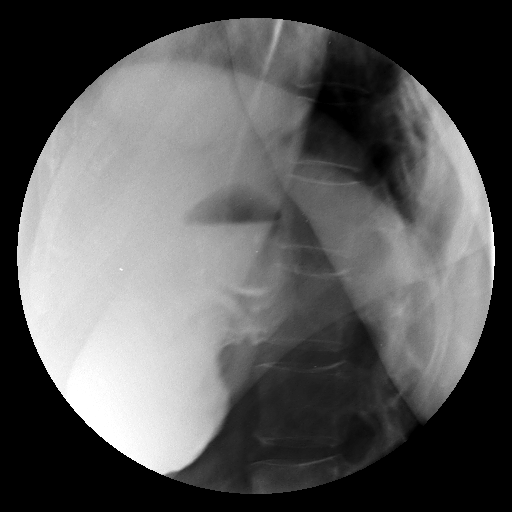
[im 3/3]
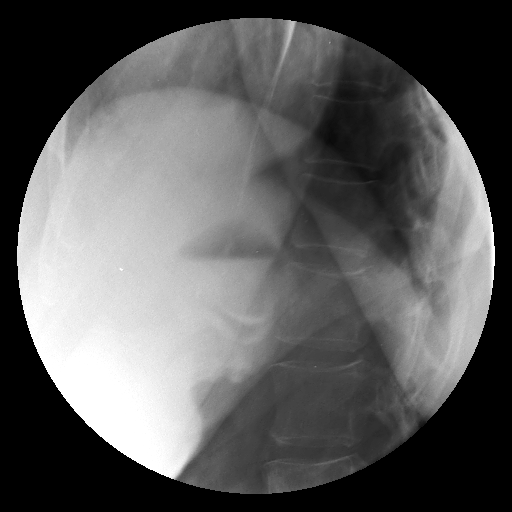

[Series 7: run · 2 of 2 slices shown (6 of 9)]
[im 1/2]
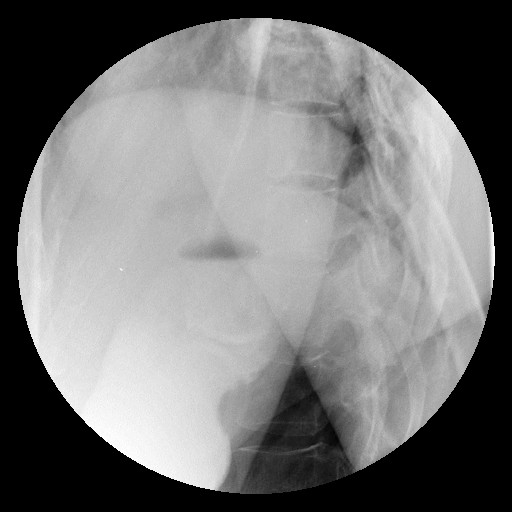
[im 2/2]
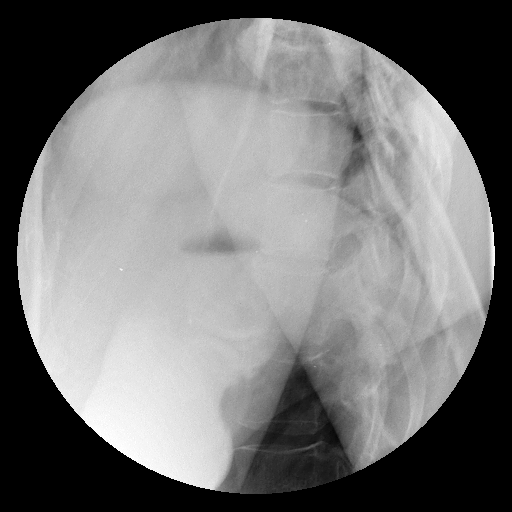

[Series 8: run · 2 of 2 slices shown (7 of 9)]
[im 1/2]
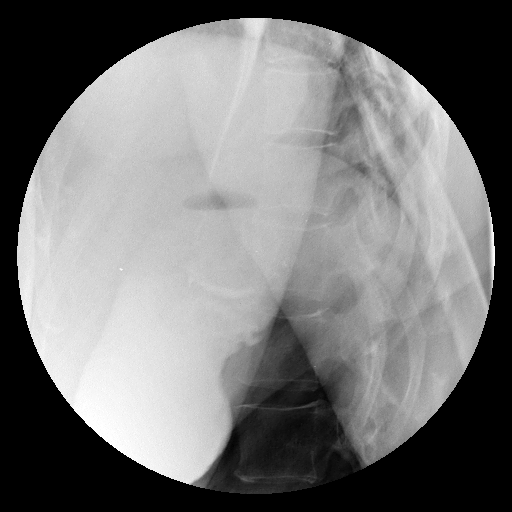
[im 2/2]
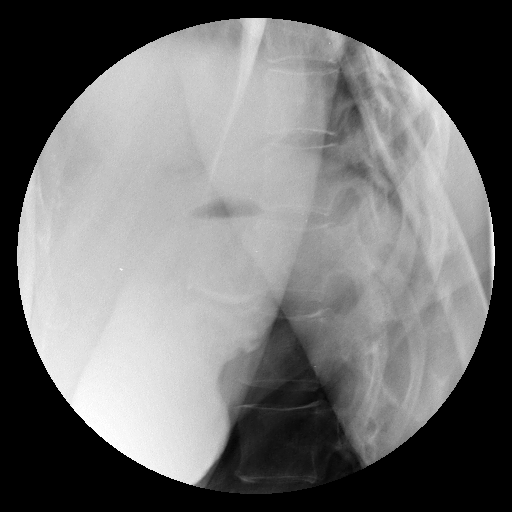

[Series 9: run · 1 of 2 slices shown (8 of 9)]
[im 2/2]
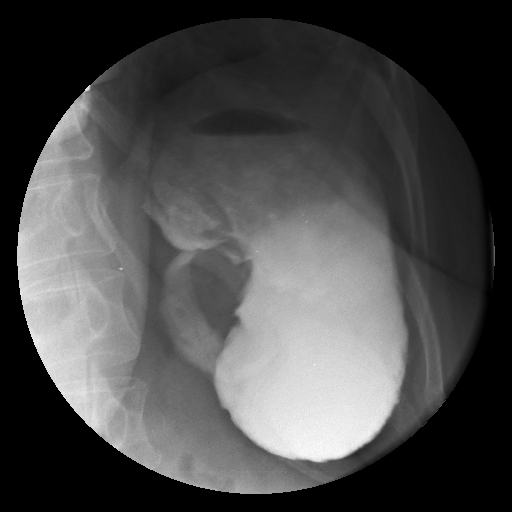

[Series 10: run · 2 of 2 slices shown (9 of 9)]
[im 1/2]
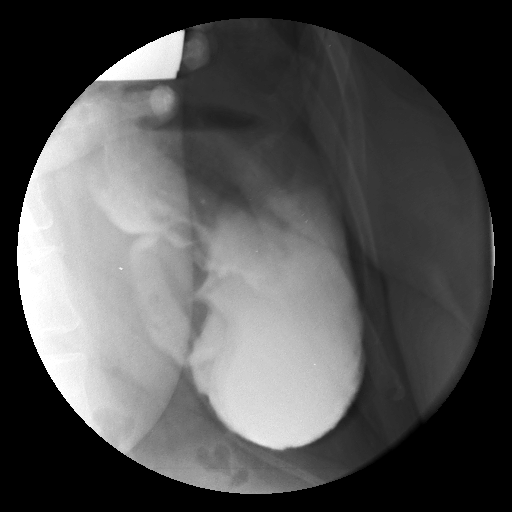
[im 2/2]
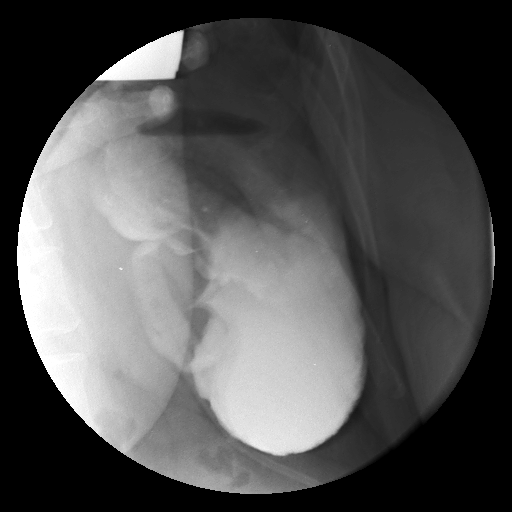

[20 of 24 positions shown; findings below may reference images not displayed]

FINDINGS: The esophagus appears normal.  The lap band has slipped.
It has an abnormal orientation.  Contrast material in the stomach
does not assume the typical somewhat constrictive appearance by the
lap band.  There is no obstruction to the flow of the contrast.
IMPRESSION: The lap band has slipped.  It does not appear to be functioning
normally.

## 2009-10-04 IMAGING — CT CT ABD-PELV W/O CM
2 of 4 series · 17 of 46 positions shown, 19 images · IV contrast (Omnipaque 300)
Comparison: [HOSPITAL] abdominal pelvic CT angiogram
[DATE], renal ultrasound [HOSPITAL] at [REDACTED] [HOSPITAL]
[DATE], and [HOSPITAL] upper GI series [DATE].

CLINICAL DATA: Lower abdominal pain, nausea, vomiting, diarrhea.
Renal insufficiency.  Cholecystectomy with the gastric lap band.

CT ABDOMEN AND PELVIS WITHOUT CONTRAST
TECHNIQUE: Multidetector CT imaging of the abdomen and pelvis was
performed following the standard protocol without intravenous
contrast.

[Series 2: abd/ pel · axial · 0.81mm/px · z∈[+994,+1364]mm · 14 of 82 slices shown, 16 images]
[im 4/82  soft-tissue]
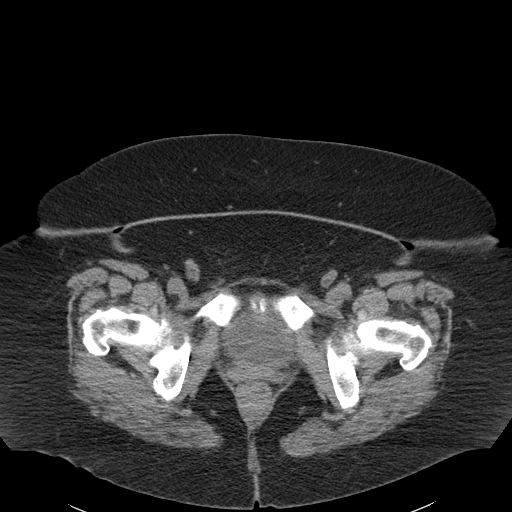
[im 4/82  bone]
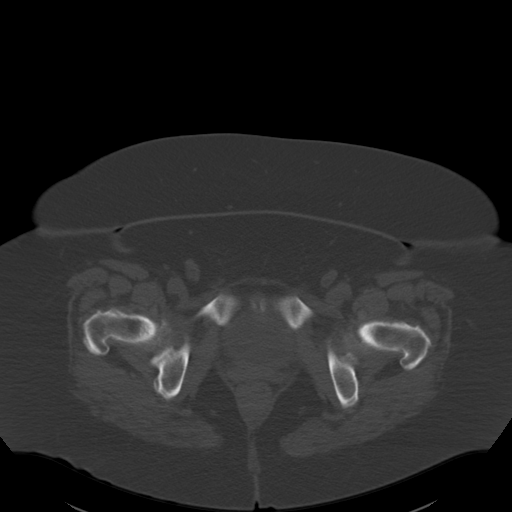
[im 10/82  soft-tissue]
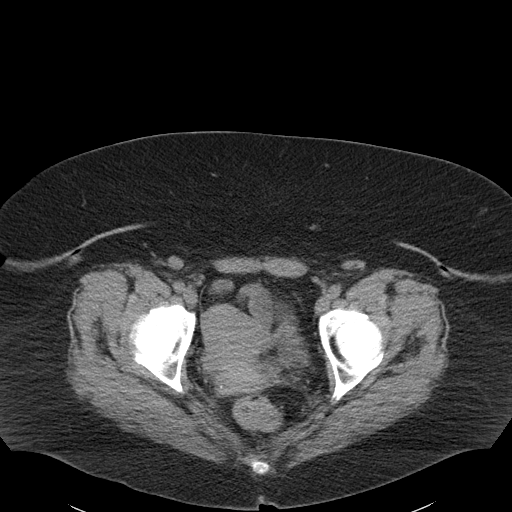
[im 16/82  soft-tissue]
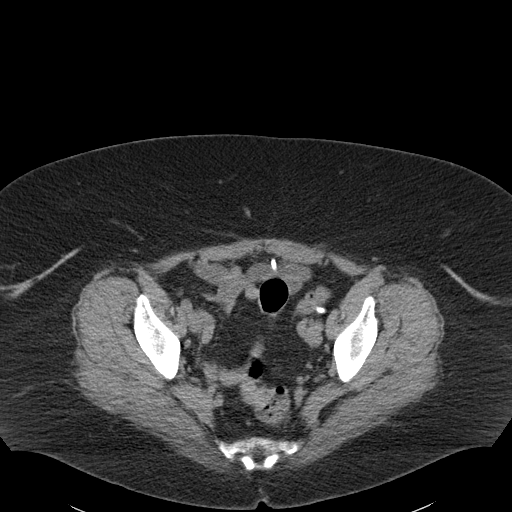
[im 22/82  soft-tissue]
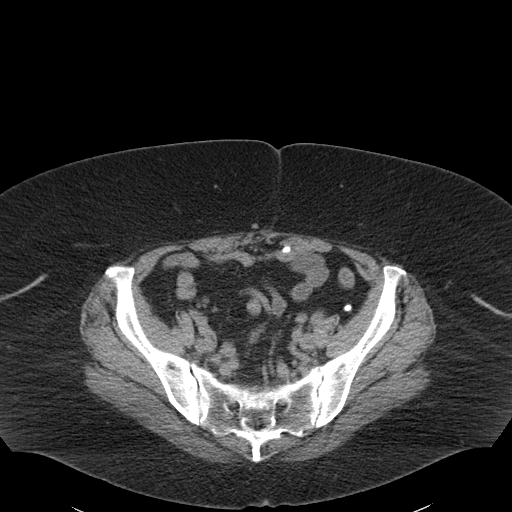
[im 29/82  soft-tissue]
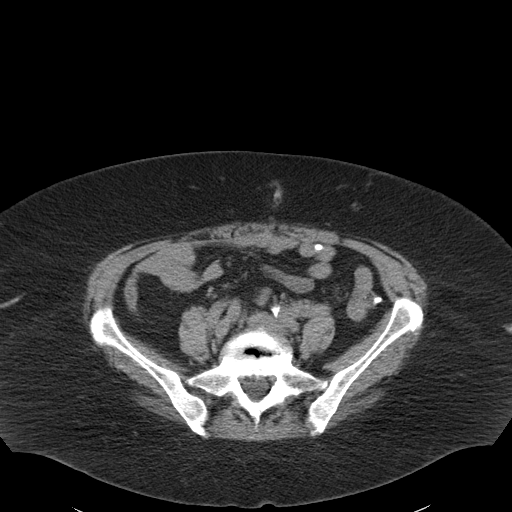
[im 32/82  soft-tissue]
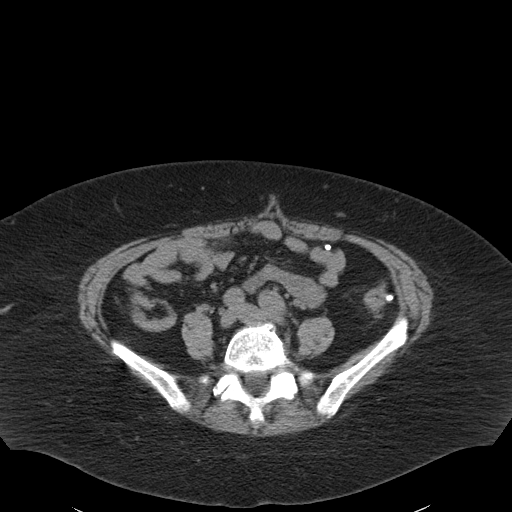
[im 38/82  soft-tissue]
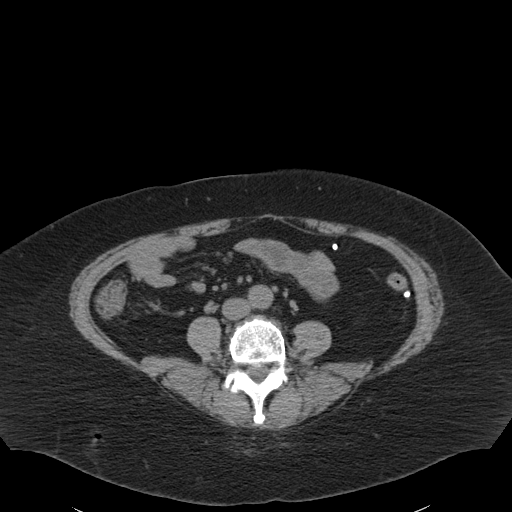
[im 44/82  soft-tissue]
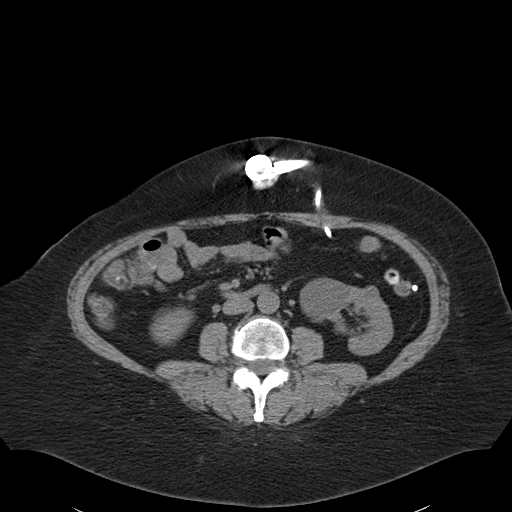
[im 50/82  soft-tissue]
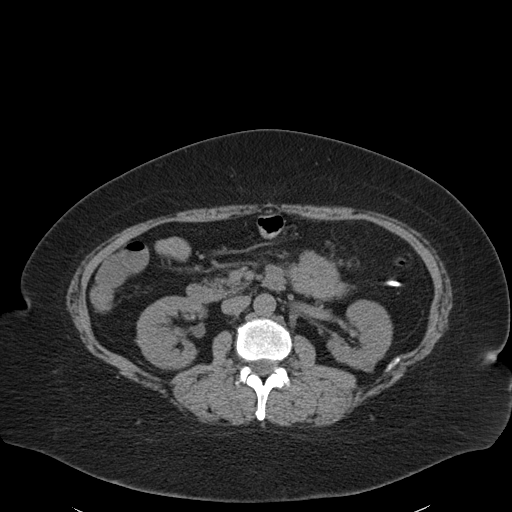
[im 50/82  bone]
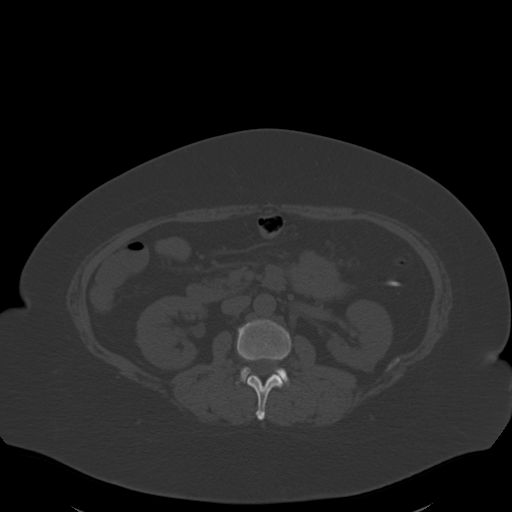
[im 53/82  soft-tissue]
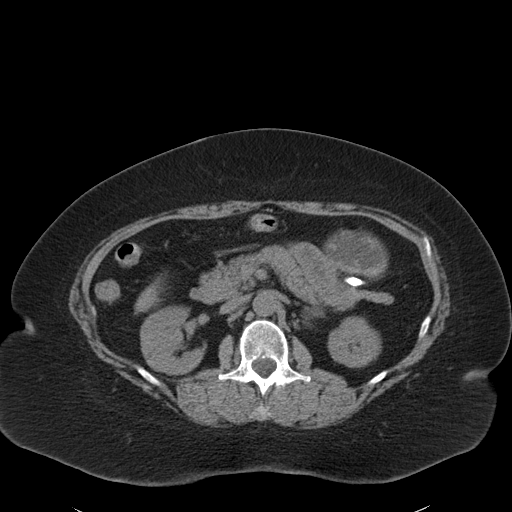
[im 60/82  soft-tissue]
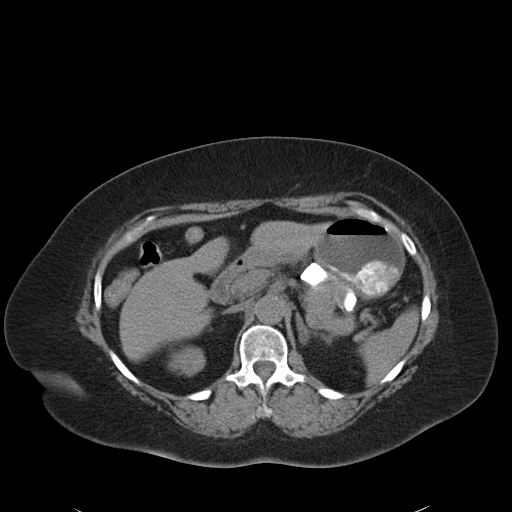
[im 66/82  soft-tissue]
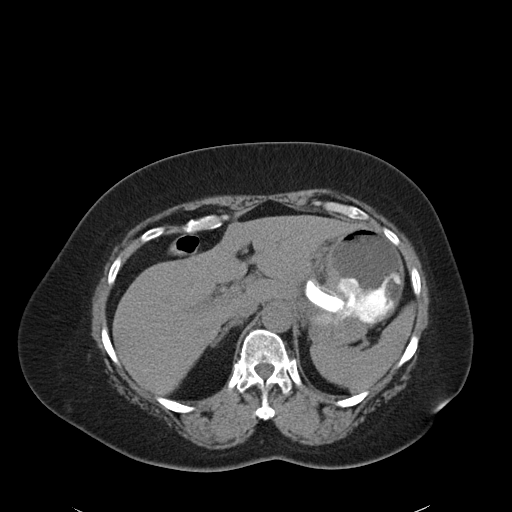
[im 72/82  soft-tissue]
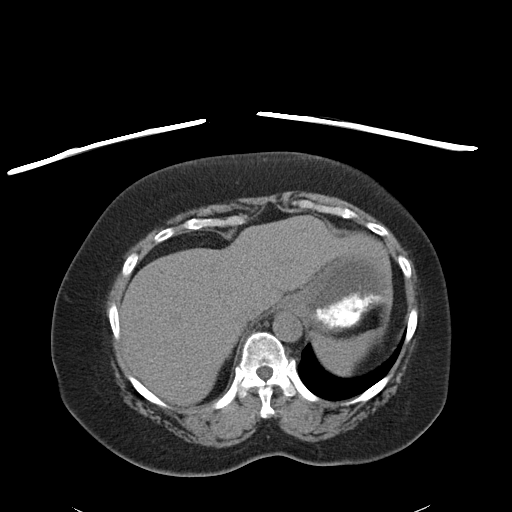
[im 78/82  soft-tissue]
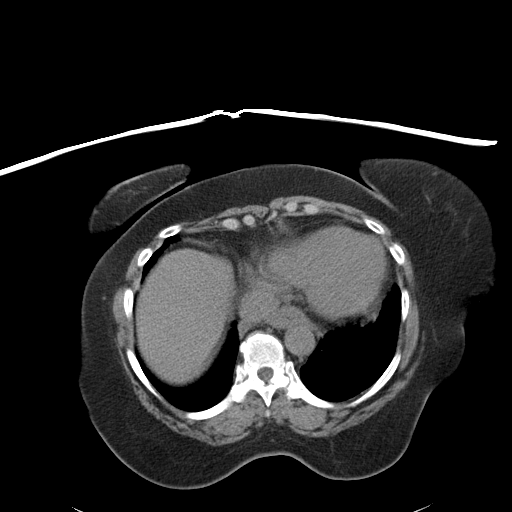

[Series 602: <mpr range> · coronal · 0.83mm/px · 3 of 101 slices shown]
[im 34/101  soft-tissue]
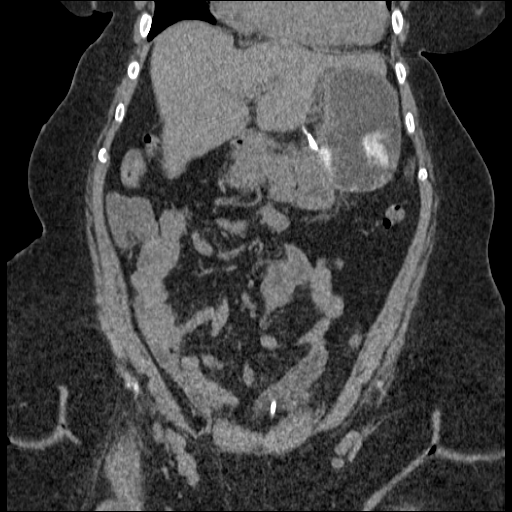
[im 45/101  soft-tissue]
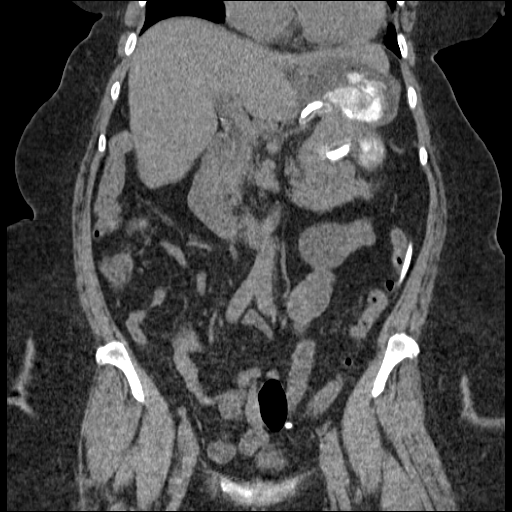
[im 56/101  soft-tissue]
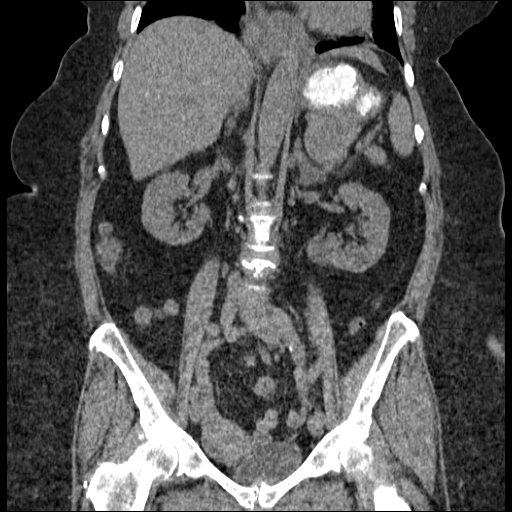

[17 of 46 positions shown; findings below may reference images not displayed]

FINDINGS: Interval slippage gastric lap band seen since [DATE].  Stable postcholecystectomy surgical clips with no dilated
biliary tract seen.  Again is seen 4.3 cm exophytic anterior lower
pole left renal cyst with interval development 5 mm lateral lower
pole left renal probable hemorrhagic cyst and anterior lower pole
10 mm cyst with 9 mm of medial upper pole fat containing a small
angiomyolipoma.  10 mm central right adrenal and 12 mm central left
adrenal lipid small adenoma nodules seen.  Intestine appears
normal.  Probable appendix appears normal.  Remaining abdominal and
pelvic organs appear normal without inflammation, free fluid or
adenopathy.  No abdominal pelvic hernias visualized. Stable slight
L4-5 and L5-S1 degenerative disc disease noted.
IMPRESSION: 1.  Interval slippage gastric lap band.
2.  Stable post cholecystectomy with no dilated biliary tract.
3.  Left renal cysts as described.  With small angiomyolipoma.
4.  Small bilateral adrenal adenomas and stable slight L4-5 and L5-
S1 degenerative disc disease.
5.  Otherwise, negative.

## 2009-10-04 IMAGING — CR DG UGI W/ GASTROGRAFIN
1 series · 1 of 1 positions shown · IV contrast (omnipaque)
Comparison: CT scan today.  [DATE] upper GI.

CLINICAL DATA: Post gastric banding in [REDACTED].  The patient unable
to eat or drink.

WATER SOLUBLE UPPER GI SERIES
TECHNIQUE: Single-column upper GI series was performed using water
soluble contrast.
Fluoroscopy Time: 0.7 minutes
Contrast: Omnipaque 300.

[view not recorded]
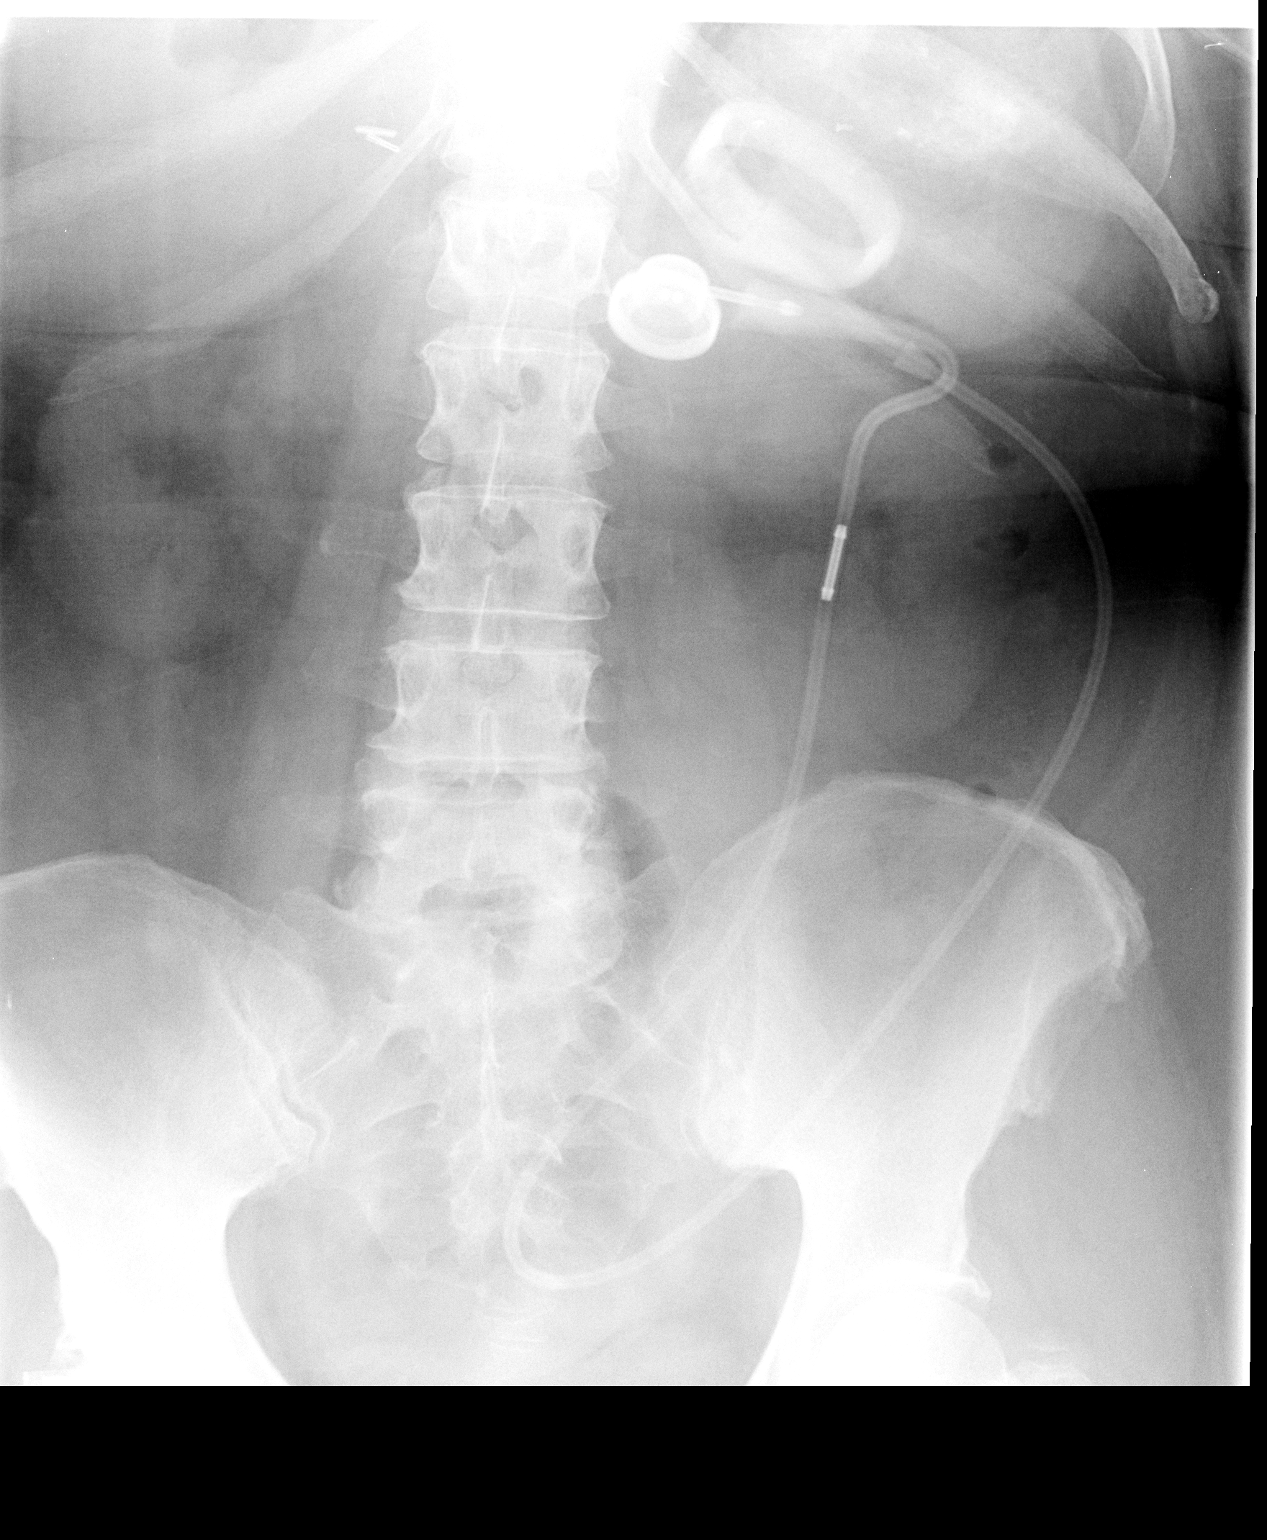

[1 of 1 positions shown; findings below may reference images not displayed]

FINDINGS: The esophagus appears normal.  The lap band has slipped.
It has an abnormal orientation.  Contrast material in the stomach
does not assume the typical somewhat constrictive appearance by the
lap band.  There is no obstruction to the flow of the contrast.
IMPRESSION: The lap band has slipped.  It does not appear to be functioning
normally.

## 2009-10-05 IMAGING — CR DG ABD PORTABLE 1V
2 series · 2 of 2 positions shown · non-contrast
Comparison: [DATE] upper GI series and abdominal CT.

CLINICAL DATA: Renal failure, pancreatitis and increased mid
abdominal pain.

ABDOMEN - 1 VIEW

[view not recorded (1 of 2)]
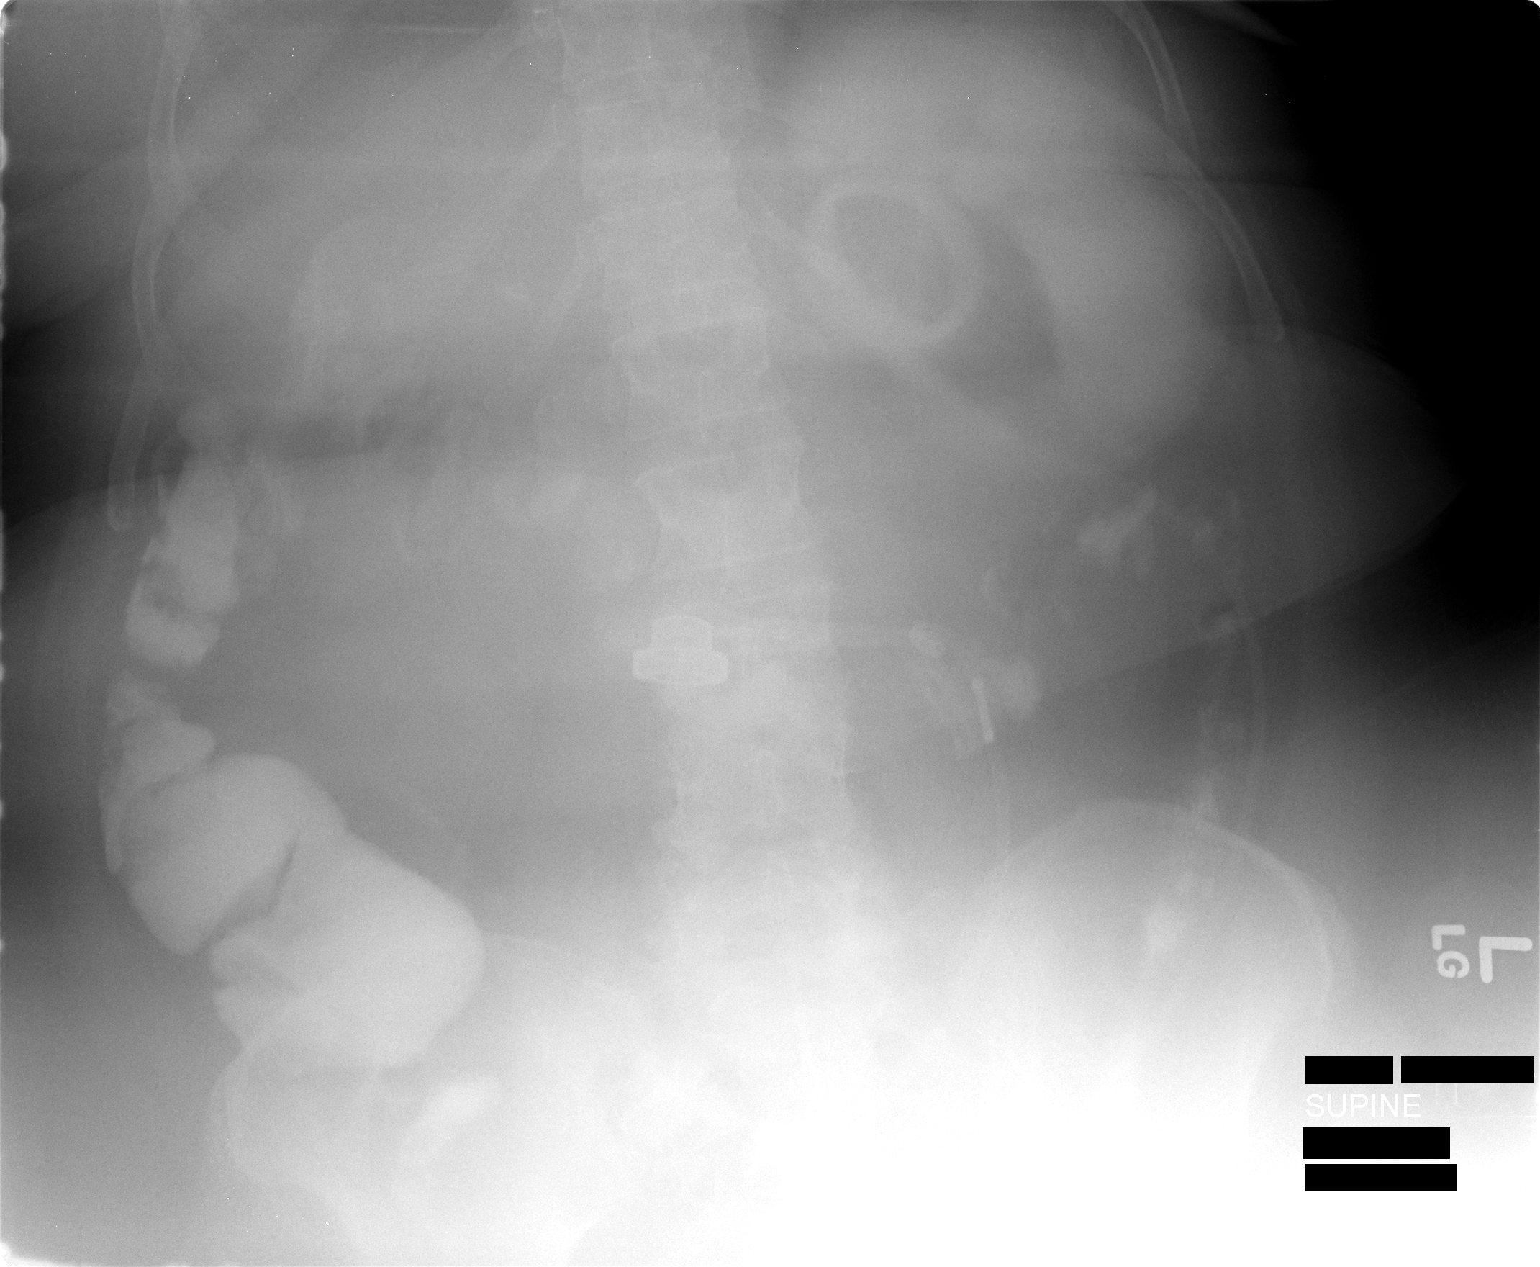

[view not recorded (2 of 2)]
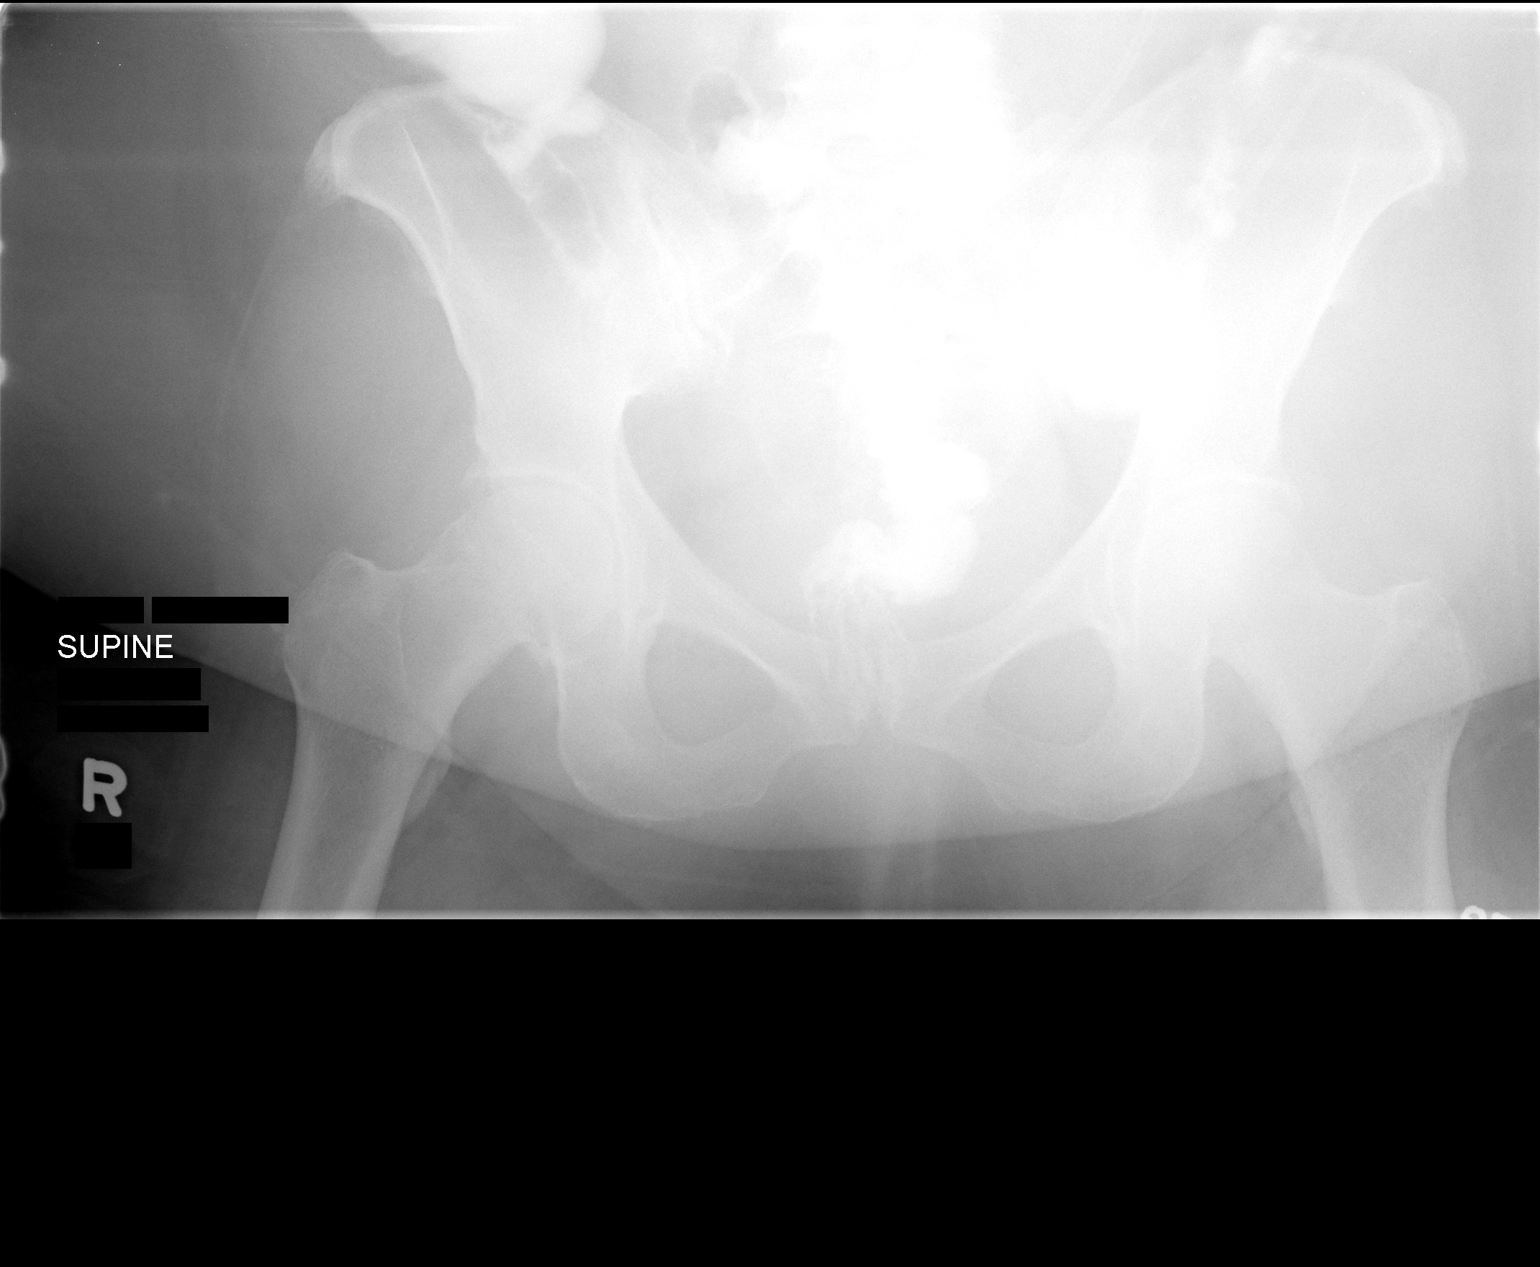

[2 of 2 positions shown; findings below may reference images not displayed]

FINDINGS: [IA] hours.  Detail is limited by body habitus and
motion.  There is contrast material in the colon related to
yesterday's upper GI series.  The slipped gastric laparoscopic band
appears unchanged in position.
IMPRESSION: Limited examination.  No demonstrated changes or acute findings.

## 2009-10-06 IMAGING — CR DG ABDOMEN 1V
1 series · 1 of 1 positions shown · non-contrast
Comparison: [DATE]

CLINICAL DATA: History of renal failure and pancreatitis.

ABDOMEN - 1 VIEW

[t abdomen supine]
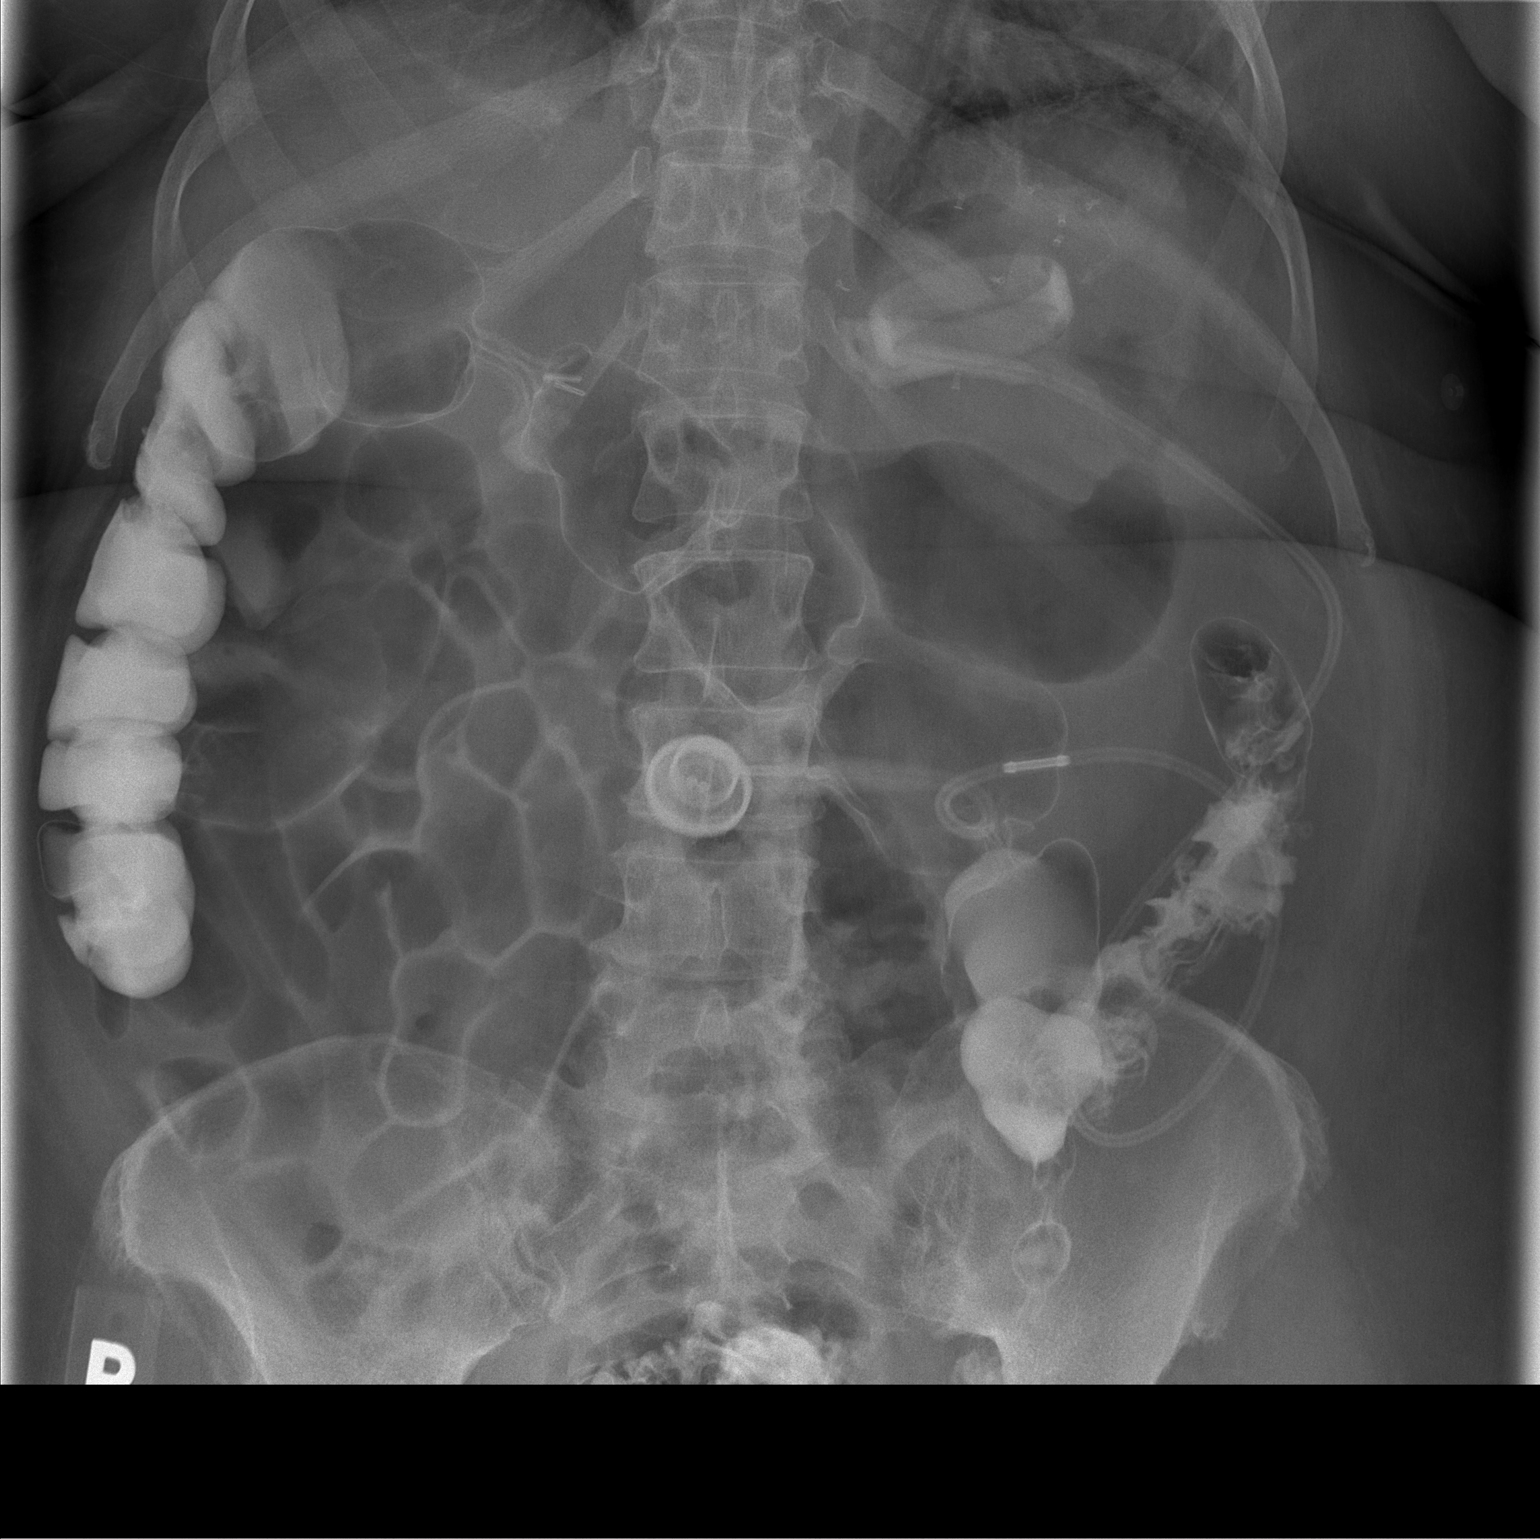

[1 of 1 positions shown; findings below may reference images not displayed]

FINDINGS: The gastric band is still seen in the left upper
quadrant.  Residual contrast from the previous GI series is in the
colon.  There is diffuse gaseous distention of bowel consistent
with element of ileus.  Cholecystectomy clips are present.  There
is osteopenic appearance of bones with degenerative spondylosis.
Enteric tube in place on the left.
IMPRESSION: Residual contrast from GI series is in the colon.  Gaseous
distention of multiple loops of small bowel appears diffuse and is
consistent with ileus.

## 2009-10-13 ENCOUNTER — Encounter: Payer: Self-pay | Admitting: Family Medicine

## 2009-10-17 ENCOUNTER — Ambulatory Visit: Payer: Self-pay | Admitting: Family Medicine

## 2009-10-17 ENCOUNTER — Telehealth (INDEPENDENT_AMBULATORY_CARE_PROVIDER_SITE_OTHER): Payer: Self-pay | Admitting: *Deleted

## 2009-11-21 ENCOUNTER — Ambulatory Visit: Payer: Self-pay | Admitting: Family Medicine

## 2009-11-21 ENCOUNTER — Telehealth (INDEPENDENT_AMBULATORY_CARE_PROVIDER_SITE_OTHER): Payer: Self-pay | Admitting: *Deleted

## 2009-11-22 ENCOUNTER — Encounter: Admission: RE | Admit: 2009-11-22 | Discharge: 2009-11-22 | Payer: Self-pay | Admitting: Surgery

## 2009-12-08 ENCOUNTER — Ambulatory Visit (HOSPITAL_COMMUNITY): Admission: RE | Admit: 2009-12-08 | Discharge: 2009-12-08 | Payer: Self-pay | Admitting: Family Medicine

## 2009-12-08 LAB — HM MAMMOGRAPHY: HM Mammogram: NEGATIVE

## 2010-03-16 ENCOUNTER — Encounter
Admission: RE | Admit: 2010-03-16 | Discharge: 2010-06-14 | Payer: Self-pay | Source: Home / Self Care | Admitting: Surgery

## 2010-03-21 ENCOUNTER — Encounter
Admission: RE | Admit: 2010-03-21 | Discharge: 2010-05-11 | Payer: Self-pay | Source: Home / Self Care | Admitting: Neurology

## 2010-04-28 ENCOUNTER — Encounter: Payer: Self-pay | Admitting: Family Medicine

## 2010-08-13 LAB — CONVERTED CEMR LAB: Pap Smear: NORMAL

## 2010-08-17 NOTE — Letter (Signed)
Summary: Heart Failure Program Status Report  Heart Failure Program Status Report   Imported By: Edmonia James 05/17/2009 09:00:34  _____________________________________________________________________  External Attachment:    Type:   Image     Comment:   External Document

## 2010-08-17 NOTE — Progress Notes (Signed)
Summary: paper work for [employer] Note Call from Patient   Caller: Patient Summary of Call: [place]   Call Back  [phone]  patient dropped off some paper work for [employer].Marland KitchenMarland KitchenMarland KitchenMarland Kitchenpaper given to the nurse. Initial call taken by: Vanessa Martinique,  September 30, 2008 2:37 PM  Follow-up for Phone Call        PATIENT AWARE FMLA PAPERS READY FOR PICK UP. Carley Hammed  October 01, 2008 8:41 AM  Follow-up by: Carley Hammed,  October 01, 2008 8:41 AM

## 2010-08-17 NOTE — Progress Notes (Signed)
Summary: REFERRAL CONCERNS  Phone Note Call from Patient Call back at Home Phone [phone]   Caller: Patient Call For: Garnet Koyanagi DO Summary of Call: MESSAGE LEFT ON MICHELLE'S VOICEMAIL: PLEASE CALL RE: OCCUPATIONAL THERAPY AND PHYSICAL THERAPY  PATIENT HAS NOT SEEN OCCUPATIONAL THERAPIST YET, PATIENT WOULD LIKE FOR [name] TO SET THIS UP. PATIENT SEEN PHYSICAL THERAPIST AND IS FINE WITH THEM.  PATIENT SAID SHE WOULD LIKE ANOTHER COMPANY TO SEE HER FOR OCCUPATIONAL THERAPY. PATIENT WAS SUPPOSE TO HAVE AN APPOINTMENT WITH THE OCCUPATIONL THERAPIST TODAY AND THEY DIDNT EVEN SHOW UP. SHE SAID ITS LIKE THEY ARE TOO BUSY FOR HER AND SHE WOULD LIKE ANOTHER COMPANY.  Chrae Malloy  October 12, 2008 1:45 PM   Follow-up for Phone Call        ALL REFERRALS ARE NOW COMPLETE, CAN VIEW UNDER COMPLETE ORDERS. Follow-up by: Phylliss Bob [facility]  November 09, 2008 4:30 PM

## 2010-08-17 NOTE — Letter (Signed)
Summary: Heart Failure Program Status Report/[facility]  Heart Failure Program Status Report/[facility]   Imported By: Edmonia James 12/08/2008 10:17:20  _____________________________________________________________________  External Attachment:    Type:   Image     Comment:   External Document

## 2010-08-17 NOTE — Miscellaneous (Signed)
Summary: Orders Update   Clinical Lists Changes  Orders: Added new Referral order of Physical Therapy Referral (PT) - Signed 

## 2010-08-17 NOTE — Letter (Signed)
Summary: Heart Failure Program Status Report/[facility]  Heart Failure Program Status Report/[facility]   Imported By: Edmonia James 12/14/2008 13:54:13  _____________________________________________________________________  External Attachment:    Type:   Image     Comment:   External Document

## 2010-08-17 NOTE — Progress Notes (Signed)
Summary: FORM  Phone Note Call from Patient   Caller: Patient Summary of Call: PATIENT DROPPED OFF A SHORT TERM DISABILITY FORM . DR Etter Sjogren. PATIENT GIVEN TO STAC[facility] Initial call taken by: Silva Bandy,  January 13, 2009 10:10 AM  Follow-up for Phone Call        spoke with pt -- advised that Dr. Etter Sjogren is out of office until 01/25/09; Will have her complete the form when she returns to the office.  Explained to patient that she has not seen any other physicians in this office that can verify her disability.  Form given to [place]. Dawson Bills  January 14, 2009 10:44 AM   Additional Follow-up for Phone Call Additional follow up Details #1::        Form finished awaiting Dr. Nonda Lou signature. Carley Hammed  January 18, 2009 8:34 AM Placed on Dr. Nonda Lou ledge Carley Hammed  January 18, 2009 8:34 AM  Additional Follow-up by: Carley Hammed,  January 18, 2009 8:34 AM    Additional Follow-up for Phone Call Additional follow up Details #2::    Patient aware form ready for pick up Carley Hammed  January 25, 2009 11:02 AM  Follow-up by: Carley Hammed,  January 25, 2009 11:02 AM

## 2010-08-17 NOTE — Letter (Signed)
Summary: Consult/[facility]  Consult/[facility]   Imported By: Edmonia James 09/29/2008 09:57:09  _____________________________________________________________________  External Attachment:    Type:   Image     Comment:   External Document

## 2010-08-17 NOTE — Miscellaneous (Signed)
Summary: Orders Update  Clinical Lists Changes  Orders: Added new Referral order of Physical Therapy Referral (PT) - Signed Added new Referral order of Occupational Therapy (OT) - Signed

## 2010-08-17 NOTE — Letter (Signed)
Summary: Dr. Donnie Coffin  Dr. Donnie Coffin   Imported By: Velora Heckler 12/16/2007 15:26:28  _____________________________________________________________________  External Attachment:    Type:   Image     Comment:   External Document

## 2010-08-17 NOTE — Progress Notes (Signed)
  Phone Note Outgoing Call Call back at [facility] Phone [phone]   Call placed by: Carley Hammed,  October 26, 2007 7:03 PM Call placed to: Patient Summary of Call: Patient aware and states: "it is doing much better, it is healing up nicely". ...................................................................Carley Hammed  October 26, 2007 7:04 PM

## 2010-08-17 NOTE — Miscellaneous (Signed)
Summary: Plan of [place]   Imported By: Edmonia James 11/16/2008 12:50:10  _____________________________________________________________________  External Attachment:    Type:   Image     Comment:   External Document

## 2010-08-17 NOTE — Letter (Signed)
Summary: Heart Failure Program Status Report/[facility]  Heart Failure Program Status Report/[facility]   Imported By: Edmonia James 02/22/2009 09:46:47  _____________________________________________________________________  External Attachment:    Type:   Image     Comment:   External Document

## 2010-08-17 NOTE — Letter (Signed)
Summary: Results Follow-up Letter  [facility]   [place] [address]   Phone: [phone]  Fax: [phone]    12/12/2007        [address] [place], [place]  [address]  Dear Ms. Lama,   The following are the results of your recent test(s):  Test     Result     Pap Smear    Normal_______  Not Normal_____       Comments: _________________________________________________________ Cholesterol LDL(Bad cholesterol):          Your goal is less than:         HDL (Good cholesterol):        Your goal is more than: _________________________________________________________ Other Tests:   _________________________________________________________  Please call for an appointment Or _Negative Mammogram________________________________________________________ _________________________________________________________ _________________________________________________________  Sincerely,  Carley Hammed [place] [facility]

## 2010-08-17 NOTE — Progress Notes (Signed)
Summary: SHORT TERM DISABILITY FORM TO COMPLETE  Phone Note Call from Patient Call back at Home Phone [phone]   Caller: Patient Summary of Call: PATIENT DROPPED OFF SHORT TERM DISABILITY FORM FOR DR [place] TO COMPLETE AND SIGN  PLEASE CALL HER WHEN FORM IS READY FOR PICKUP  WILL TAKE TO DANIELLE Initial call taken by: Berneta Sages,  October 17, 2009 4:31 PM  Follow-up for Phone Call        done, upfront for pt to pick up. [place]  October 17, 2009 5:12 PM left message to for pt.

## 2010-08-17 NOTE — Assessment & Plan Note (Signed)
Summary: NEW PT TO ESTAB--HAD TO [place] FROM FIRST PART OF MARCH--OK PE...   Vital Signs:  Patient Profile:   65 Years Old Female Height:     65 inches Weight:      278 pounds Temp:     98.6 degrees F oral Pulse rate:   74 / minute Resp:     16 per minute BP sitting:   120 / 70  (right arm)  Pt. in pain?   no  Vitals Entered By: Carley Hammed (October 20, 2007 3:48 PM)                   PCP:  Etter Sjogren  Chief Complaint:  New to establish-Has questions.  History of Present Illness: Pt here with multiple problems.  Pt c/o rash around breasts for a few years  and over past year rash has gotten worse.    Pt has been to the Dr several times for this but never saw  Dermatology. Pt has used several creams and oint. with no relief.    Pt also trying to loose weight with a low carb diet and low salt diet-- her cardiologisst gave her this diet to try.  Pt was  exercising before stress fractures in feet.  But since b/L stress fx but has been unable to exericise.  She is planning to get back into pool for water aerobics.  Hypertension Follow-Up      This is a 65 year old woman who presents for Hypertension follow-up.  The patient denies lightheadedness, urinary frequency, headaches, edema, impotence, rash, and fatigue.  The patient denies the following associated symptoms: chest pain, chest pressure, exercise intolerance, dyspnea, palpitations, syncope, leg edema, and pedal edema.  Compliance with medications (by patient report) has been near 100%.  The patient reports that dietary compliance has been good.  The patient reports exercising 3-4X per week.  Adjunctive measures currently used by the patient include salt restriction.    Hypertension History:      Positive major cardiovascular risk factors include female age 34 years old or older and hypertension.  Negative major cardiovascular risk factors include non-tobacco-user status.       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[employer]       Married       Never Smoked       Alcohol use-no       Drug use-no       Regular exercise-no    Ethnicity:  Black    Occupation:  Investment banker, corporate or Occupations    Education:  Secretary/administrator    Marital Status:  Married    Sex Orientation:  Heterosexual    Religion:  Baptist    Alcohol:  None  Tobacco Use:  Never    Transportation:  Education administrator    Residence:  Freight forwarder or Mobile Home   Risk Factors:  Tobacco use:  never Passive smoke exposure:  no Drug use:  no HIV high-risk behavior:  no Caffeine use:  2 drinks per day Alcohol use:  no Exercise:  no Seatbelt use:  100 % Sun Exposure:  rarely   Review of Systems      See HPI       The patient complains of weight gain and depression.     Physical Exam  General:     Well-developed,well-nourished,in no acute distress; alert,appropriate and cooperative throughout examination Neck:     No deformities, masses, or tenderness noted. Breasts:     +hyperpigmented skin b/L breasts with pustules under R breast--  cultures done  Lungs:     Normal respiratory effort, chest expands symmetrically. Lungs are clear to auscultation, no crackles or wheezes. Heart:     normal rate, regular rhythm, and no murmur.   Msk:     normal ROM and no joint swelling.   Extremities:     No clubbing, cyanosis, edema, or deformity noted with normal full range of motion of all joints.   Neurologic:      No cranial nerve deficits noted. Station and gait are normal. Plantar reflexes are down-going bilaterally. DTRs are symmetrical throughout. Sensory, motor and coordinative functions appear intact.    Impression & Recommendations:  Problem # 1:  FOLLICULITIS (0000000) bactrim DS  for 2 weeks Orders: T-Culture, Wound [id number] call if not getting better and we will refer to Derm rx for lotrisone as well Orders: T-Culture, Wound [id number])   Problem # 2:  HYPERTENSION (ICD-401.9) RTO CPE The following medications were removed from the medication list:    Catapres 0.1 Mg Tabs (Clonidine hcl)  Her updated medication list for this problem includes:    Toprol Xl 100 Mg Tb24 (Metoprolol succinate)    Lisinopril 40 Mg Tabs (Lisinopril) ..... Once daily    Furosemide 80 Mg Tabs (Furosemide) .Marland Kitchen..Marland Kitchen Two times a day    Sotalol Hcl (af) 80 Mg Tabs (Sotalol hcl af) .Marland Kitchen..Marland Kitchen Two times a day    Cardizem Cd 360 Mg Cp24 (Diltiazem hcl coated beads)  BP today: 120/70 Prior BP: 140/88 (08/01/2006)  10 Yr Risk Heart Disease: Not enough information   Problem # 3:  MORBID OBESITY (ICD-278.01) d/w pt diet and exercise  Complete Medication List: 1)  Toprol Xl 100 Mg Tb24 (Metoprolol succinate) 2)  Lisinopril 40 Mg Tabs (Lisinopril) .... Once daily 3)  Coumadin 7.5 Mg Tabs (Warfarin sodium) .... As directed 4)  Furosemide 80 Mg Tabs (Furosemide) .... Two times a day 5)  Sotalol Hcl (af) 80 Mg Tabs (Sotalol hcl af) .... Two times a day 6)  Cardizem Cd 360 Mg Cp24 (Diltiazem hcl coated beads) 7)  Potassium 41meq  .... Two times a day 8)  Bactrim Ds 800-160 Mg Tabs (Sulfamethoxazole-trimethoprim) .Marland Kitchen.. 1 by mouth two times a day 9)  Lotrisone 1-0.05 % Crea (Clotrimazole-betamethasone) .... Apply to affected area two times a day for 2 weeks 10)  Wellbutrin Xl 150 Mg Tb24 (Bupropion hcl) .Marland Kitchen.. 1 by mouth once daily  Hypertension Assessment/Plan:       The patient's hypertensive risk group is category B: At least one risk factor (excluding diabetes) with no target organ damage.  Today's blood pressure is 120/70.       Prescriptions: WELLBUTRIN XL 150 MG  TB24 (  BUPROPION HCL) 1 by mouth once daily  #30 x 0   Entered and Authorized by:   Garnet Koyanagi DO   Signed by:   Garnet Koyanagi DO on 10/20/2007   Method used:   Historical   RxIDST:[id number] LOTRISONE 1-0.05 %  CREA (CLOTRIMAZOLE-BETAMETHASONE) apply to affected area two times a day for 2 weeks  #30 g x 0   Entered and Authorized by:   Garnet Koyanagi DO   Signed by:   Garnet Koyanagi DO on 10/20/2007   Method used:   Electronically sent to ...       [facility]  [place] # [id number]*       [address]       [place], [place]  [address]       Ph: [phone] or [phone]       Fax: [phone]   RxID:   [id number] BACTRIM DS 800-160 MG  TABS (SULFAMETHOXAZOLE-TRIMETHOPRIM) 1 by mouth two times a day  #28 x 0   Entered and Authorized by:   Garnet Koyanagi DO   Signed by:   Garnet Koyanagi DO on 10/20/2007   Method used:   Electronically sent to ...       [facility]  [place] # [id number]*       [address]       [place], [place]  [address]       Ph: [phone] or [phone]       Fax: [phone]   RxID:   [id number]  ]

## 2010-08-17 NOTE — Letter (Signed)
Summary: [facility]  [facility]   Imported By: Edmonia James 02/04/2009 12:19:17  _____________________________________________________________________  External Attachment:    Type:   Image     Comment:   External Document

## 2010-08-17 NOTE — Letter (Signed)
Summary: [facility]  [facility]   Imported By: Edmonia James 05/11/2008 15:57:25  _____________________________________________________________________  External Attachment:    Type:   Image     Comment:   External Document

## 2010-08-17 NOTE — Letter (Signed)
Summary: [facility]ICE [facility]  [facility]   Imported By: Velora Heckler 11/17/2007 15:41:05  _____________________________________________________________________  External Attachment:    Type:   Image     Comment:   External Document

## 2010-08-17 NOTE — Letter (Signed)
Summary: Heart Failure Program Status Report/[facility]  Heart Failure Program Status Report/[facility]   Imported By: Edmonia James 10/21/2009 10:29:07  _____________________________________________________________________  External Attachment:    Type:   Image     Comment:   External Document

## 2010-08-17 NOTE — Assessment & Plan Note (Signed)
Summary: rash on chest.cbs   Vital Signs:  Patient profile:   65 year old female Height:      65 inches Weight:      293.50 pounds BMI:     49.02 Temp:     97.8 degrees F oral Pulse rate:   76 / minute Resp:     18 per minute BP sitting:   160 / 98  (right arm)  Vitals Entered By: Carley Hammed (November 03, 2008 9:42 AM) CC: RASH ON CHEST Is Patient Diabetic? No Pain Assessment Patient in pain? no       Have you ever been in a relationship where you felt threatened, hurt or afraid?No   Does patient need assistance? Functional Status Self care Ambulation Normal   History of Present Illness: Pt here c/o rash under R breast and cyst that is draining.  + pruritis,  no fever.  no pain.  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 Take One Tablet Once Daily 2)  Lisinopril 40 Mg  Tabs (Lisinopril) .... Once Daily 3)  Furosemide 80 Mg  Tabs (Furosemide) .... Two Times A Day 4)  Sotalol Hcl (Af) 80 Mg  Tabs (Sotalol Hcl Af) .... Two Times A Day 5)  Cardizem Cd 360 Mg  Cp24 (Diltiazem Hcl Coated Beads) .... Take One Tablet Once Daily 6)  Klor-Con 20 Meq Pack (Potassium Chloride) .... Take One Tablet Three Times A Day 7)  Hectorol 0.5 Mcg Caps (Doxercalciferol) .... Take 3 Tablets Daily. 8)  Adult Aspirin Ec Low Strength 81 Mg Tbec (Aspirin) .Marland Kitchen.. 1 By Mouth Once Daily  Current Medications (verified): 1)  Toprol Xl 100 Mg  Tb24 (Metoprolol Succinate) .... Take One Tablet Once Daily  2)  Lisinopril 40 Mg  Tabs (Lisinopril) .... One Tablet Twice Daily. 3)  Furosemide 80 Mg  Tabs (Furosemide) .... Three Times A Day 4)  Sotalol Hcl (Af) 80 Mg  Tabs (Sotalol Hcl Af) .... Two Times A Day 5)  Cardizem Cd 360 Mg  Cp24 (Diltiazem Hcl Coated Beads) .... Take One Tablet Once Daily 6)  Klor-Con 20 Meq Pack (Potassium Chloride) .... Take One and One Half Tablet Two Times A Day 7)  Hectorol 0.5 Mcg Caps (Doxercalciferol) .... Take 3 Tablets Daily. 8)  Adult Aspirin Ec Low Strength 81 Mg Tbec (Aspirin) .Marland Kitchen.. 1 By Mouth Once Daily 9)  Bactrim Ds 800-160 Mg Tabs (Sulfamethoxazole-Trimethoprim) .Marland Kitchen.. 1 By Mouth Two Times A Day 10)  Lotrisone 1-0.05 % Lotn (Clotrimazole-Betamethasone) .... Apply Two Times A Day  Allergies (verified): No Known Drug Allergies  Past History:  Past medical, surgical, family and social histories (including risk factors) reviewed, and no changes noted (except as noted below).  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     Impression & Recommendations:  Problem # 1:  SEBACEOUS CYST, BREAST (ICD-610.8) bactrim two times a day for 10 days  Orders: T-Culture, Wound ([id number])  Problem # 2:  TINEA CORPORIS (ICD-110.5) lotrisone lotion two times a day   Problem # 3:  MORBID OBESITY (ICD-278.01) discussed lap band,  diet and exercise Ht: 65 (11/03/2008)   Wt: 293.50 (11/03/2008)   BMI: 49.02 (11/03/2008)  Complete Medication List: 1)  Toprol Xl 100 Mg Tb24 (Metoprolol succinate) .... Take one tablet once daily 2)  Lisinopril 40 Mg Tabs (Lisinopril) .... One tablet twice daily. 3)  Furosemide 80 Mg Tabs (Furosemide) .... Three times a day 4)  Sotalol Hcl (af) 80 Mg Tabs (Sotalol hcl af) .... Two times a day 5)  Cardizem Cd 360 Mg Cp24 (Diltiazem hcl coated beads) .... Take one tablet once daily 6)  Klor-con 20 Meq Pack (Potassium chloride) .... Take one and one half tablet two times a day 7)  Hectorol 0.5 Mcg Caps (Doxercalciferol) .... Take 3 tablets daily. 8)  Adult Aspirin Ec Low Strength 81 Mg Tbec (Aspirin) .Marland Kitchen.. 1 by mouth once daily 9)  Bactrim Ds 800-160 Mg Tabs (Sulfamethoxazole-trimethoprim) .Marland Kitchen.. 1 by mouth two times a day 10)  Lotrisone 1-0.05 % Lotn (Clotrimazole-betamethasone) .... Apply two times a day Prescriptions: LOTRISONE 1-0.05 % LOTN (CLOTRIMAZOLE-BETAMETHASONE) apply two times a day  #30 g x 0   Entered and Authorized by:   Garnet Koyanagi DO   Signed by:   Garnet Koyanagi DO on 11/03/2008   Method used:   Electronically to        [facility]. # [id number]* (retail)       [address]       [place], [place]  [address]       Ph: [phone]       Fax: [phone]   RxID:   [id number] BACTRIM DS 800-160 MG TABS (SULFAMETHOXAZOLE-TRIMETHOPRIM) 1 by mouth two times a day  #20 x 0    Entered and Authorized by:   Garnet Koyanagi DO   Signed by:   Garnet Koyanagi DO on 11/03/2008   Method used:   Electronically to        [facility]. # [id number]* (retail)       [address]       [place], [place]  [id number]       Ph: [phone] or [phone]       Fax: [phone]   RxID:   [id number]

## 2010-08-17 NOTE — Letter (Signed)
Summary: Heart Failure Program Status Report/[facility]  Heart Failure Program Status Report/[facility]   Imported By: Edmonia James 06/07/2009 12:31:33  _____________________________________________________________________  External Attachment:    Type:   Image     Comment:   External Document

## 2010-08-17 NOTE — Letter (Signed)
Summary: [facility]   Imported By: Edmonia James 08/24/2009 10:33:18  _____________________________________________________________________  External Attachment:    Type:   Image     Comment:   External Document

## 2010-08-17 NOTE — Letter (Signed)
Summary: Results Follow-up Letter  [facility] [place]   [place], [place] [address]   Phone: [phone]  Fax: [phone]    11/08/2008        [name] [address] [place], [place]  [address]  Dear Ms. Viney,   The following are the results of your recent test(s):  Test     Result     Pap Smear    Normal_______  Not Normal_____       Comments: _________________________________________________________ Cholesterol LDL(Bad cholesterol):          Your goal is less than:         HDL (Good cholesterol):        Your goal is more than: _________________________________________________________ Other Tests:   _________________________________________________________  Please call for an appointment Or _PLEASE SEE ATTACHED LABWORK.________________________________________________________ _________________________________________________________ _________________________________________________________  Sincerely,  Carley Hammed [place] [facility]

## 2010-08-17 NOTE — Letter (Signed)
Summary: [facility]   Imported By: Edmonia James 02/17/2009 13:47:19  _____________________________________________________________________  External Attachment:    Type:   Image     Comment:   External Document

## 2010-08-17 NOTE — Progress Notes (Signed)
Summary: LOWNE--REFILL  Phone Note Refill Request   Refills Requested: Medication #1:  CLOTRIMAZOLE-BETAMETHASONE CRM [facility] [place] [facility] QN:[id number]  Initial call taken by: Velora Heckler,  January 21, 2008 2:51 PM  Follow-up for Phone Call        This rx has been denied need ov.Verdie Mosher  January 21, 2008 4:30 PM  Follow-up by: Verdie Mosher,  January 21, 2008 4:30 PM

## 2010-08-17 NOTE — Letter (Signed)
Summary: Heart Failure Program Status Report/[facility]  Heart Failure Program Status Report/[facility]   Imported By: Edmonia James 09/22/2008 10:07:01  _____________________________________________________________________  External Attachment:    Type:   Image     Comment:   External Document

## 2010-08-17 NOTE — Progress Notes (Signed)
Summary: SHORT TERM DIS PAPERWORK; 2 YEAR WEIGHT LOG  Phone Note Call from Patient Call back at Home Phone [phone]   Caller: Patient Summary of Call: 1)  PATIENT CAME IN [place] PAPERWORK FOR DR Joshua TO FILL OUT--WILL MAKE COPY AND SEND ORIGINAL TO [place]  2) PATIENT ALSO GAVE A LIST OF HER WEIGHT FROM HER RECORDS AT [facility] FROM MARCH 2006 TO OCTOBER 2009--WILL MAKE COPY OF THIS LIST TOO AND ATTACH IT TO THE COPY MENTIONED ABOVE  Initial call taken by: Berneta Sages,  November 10, 2008 10:23 AM  Follow-up for Phone Call        Patient aware.  Carley Hammed  November 10, 2008 10:48 AM  Follow-up by: Carley Hammed,  November 10, 2008 10:48 AM

## 2010-08-17 NOTE — Letter (Signed)
Summary: Heart Failure Program Status Report/[facility]  Heart Failure Program Status Report/[facility]   Imported By: Edmonia James 10/08/2008 10:38:53  _____________________________________________________________________  External Attachment:    Type:   Image     Comment:   External Document

## 2010-08-17 NOTE — Progress Notes (Signed)
Summary: forms   Phone Note Outgoing Call   Summary of Call: Called and left message for pt that forms are ready to be picked up. Forms are up front.  Initial call taken by: Allyn Kenner CMA,  May 11, 2009 12:58 PM

## 2010-08-17 NOTE — Letter (Signed)
Summary: Heart Failure Program Status Report/[facility]  Heart Failure Program Status Report/[facility]   Imported By: Edmonia James 09/13/2008 10:56:16  _____________________________________________________________________  External Attachment:    Type:   Image     Comment:   External Document

## 2010-08-17 NOTE — Letter (Signed)
Summary: Bariatric Surgery Paperwork/[facility]  Bariatric Surgery Paperwork/[facility]   Imported By: Edmonia James 11/16/2008 12:17:37  _____________________________________________________________________  External Attachment:    Type:   Image     Comment:   External Document

## 2010-08-17 NOTE — Progress Notes (Signed)
Summary: PAPERWORK FOR DISCHARGE SUMMARY FOR PHYSICAL SUMMARY  Phone Note Call from Patient Call back at Home Phone [phone]   Caller: Patient Summary of Call: PATIENT TURNED [place] DR Etter Sjogren TO [place], WILL SEND ORIGINAL TO [place] Initial call taken by: Berneta Sages,  January 25, 2009 12:33 PM  Follow-up for Phone Call        Put in bin for scanning. Carley Hammed  January 25, 2009 12:36 PM  Follow-up by: Carley Hammed,  January 25, 2009 12:36 PM

## 2010-08-17 NOTE — Assessment & Plan Note (Signed)
Summary: hosp f/u   Vital Signs:  Patient profile:   65 year old female Height:      65 inches Weight:      274.2 pounds BMI:     45.79 BSA:     2.26 Temp:     97.8 degrees F oral Pulse rate:   76 / minute Pulse rhythm:   regular Resp:     18 per minute BP sitting:   128 / 72  (right arm)  Vitals Entered By: Carley Hammed (September 28, 2008 3:45 PM)   History of Present Illness: Pt here for f/u hosp. ---see d/c summary. Pt doing much better c/o R knee pain--maybe from compensating--L sided weakness while in hospital. Pt needs PT/OT referral.  Problems Prior to Update: 1)  Weakness, Left Side of Body  (ICD-342.90) 2)  Preventive Health Care  (ICD-V70.0) 3)  Morbid Obesity  ([id number]) 4)  Folliculitis  (0000000) 5)  Asthma  (ICD-493.90) 6)  Arrhythmia, Hx of  (ICD-V12.50) 7)  Hypertension  (ICD-401.9)  Medications Prior to Update: 1)  Toprol Xl 100 Mg  Tb24 (Metoprolol Succinate) .... Take One Tablet Once Daily 2)  Lisinopril 40 Mg  Tabs (Lisinopril) .... Once Daily 3)  Coumadin 7.5 Mg  Tabs (Warfarin Sodium) .... As Directed 4)  Furosemide 80 Mg  Tabs (Furosemide) .... Two Times A Day 5)  Sotalol Hcl (Af) 80 Mg  Tabs (Sotalol Hcl Af) .... Two Times A Day 6)  Cardizem Cd 360 Mg  Cp24 (Diltiazem Hcl Coated Beads) .... Take One Tablet Once Daily 7)  Klor-Con 20 Meq Pack (Potassium Chloride) .... Take One Tablet Three Times A Day 8)  Hectorol 0.5 Mcg Caps (Doxercalciferol) .... Take 3 Tablets Daily.  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    Never Smoked    Alcohol use-no    Drug use-no    Regular exercise-yes     (04/29/2008)  Risk Factors:    Alcohol Use: N/A    >5 drinks/d w/in last 3 months: N/A    Caffeine Use: 2 (10/20/2007)    Diet: N/A     Exercise: yes (04/29/2008)  Risk Factors:    Smoking Status: never (10/20/2007)    Packs/Day: N/A    Cigars/wk: N/A    Pipe Use/wk: N/A    Cans of tobacco/wk: N/A    Passive Smoke Exposure: no (10/20/2007)  Past Medical History:    Reviewed history from 10/20/2007 and no changes required:    ASTHMA (ICD-493.90)    ARRHYTHMIA, HX OF (ICD-V12.50)    HYPERTENSION (ICD-401.9)    Stress fractures in both feet  Past Surgical History:    Reviewed history from 10/20/2007 and no changes required:    Heart valve surgery    Cholecystectomy  Family History:    Reviewed history from 10/20/2007 and no changes required:       Mother-Diabetes, HTN,        Father-deceased-Hodgkins Disease - 32 yrs ago  Social History:    Reviewed history from 04/29/2008 and no changes required:       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   Lungs:  Normal respiratory effort, chest expands symmetrically. Lungs are clear to auscultation, no crackles or wheezes. Heart:  normal rate, regular rhythm, and no murmur.   Abdomen:  Bowel sounds positive,abdomen soft and non-tender without masses, organomegaly or hernias noted. Msk:  normal ROM, no joint swelling, no joint warmth, no redness over joints, no joint deformities, no joint instability, and no crepitation.    Neurologic:  min L sided weakness--much improved since admission Skin:  Intact without suspicious lesions or rashes Cervical Nodes:  No lymphadenopathy noted Psych:  Cognition and judgment appear intact. Alert and cooperative with normal attention span and concentration. No apparent delusions, illusions, hallucinations   Impression & Recommendations:  Problem # 1:  CVA (ICD-434.91) pt has f/u with neuro  The following medications were removed from the medication list:    Coumadin 7.5 Mg Tabs (Warfarin sodium) .Marland Kitchen... As directed Her updated medication list for this problem includes:    Adult Aspirin Ec Low Strength 81 Mg Tbec (Aspirin) .Marland Kitchen... 1 by mouth once daily  Orders: Occupational Therapy (OT) Physical Therapy Referral (PT)  Problem # 2:  HYPERTENSION (ICD-401.9)  Her updated medication list for this problem includes:    Toprol Xl 100 Mg Tb24 (Metoprolol succinate) .Marland Kitchen... Take one tablet once daily    Lisinopril 40 Mg Tabs (Lisinopril) ..... Once daily    Furosemide 80 Mg Tabs (Furosemide) .Marland Kitchen..Marland Kitchen Two times a day    Sotalol Hcl (af) 80 Mg Tabs (Sotalol hcl af) .Marland Kitchen..Marland Kitchen Two times a day    Cardizem Cd 360 Mg Cp24 (Diltiazem hcl coated beads) .Marland Kitchen... Take one tablet once daily  Orders: Venipuncture HR:[id number]) TLB-BMP (Basic Metabolic Panel-BMET) (99991111)  BP today: 128/72 Prior BP: 150/100 (09/21/2008)  Prior 10 Yr Risk Heart Disease: Not enough information (10/20/2007)  Complete Medication List: 1)  Toprol Xl 100 Mg Tb24 (Metoprolol succinate) .... Take one tablet once daily 2)  Lisinopril 40 Mg Tabs (Lisinopril) .... Once daily 3)  Furosemide 80 Mg Tabs (Furosemide) .... Two times a day 4)  Sotalol Hcl (af) 80 Mg Tabs (Sotalol hcl af) .... Two times a day 5)  Cardizem Cd 360 Mg Cp24 (Diltiazem hcl coated beads) .... Take one tablet once daily 6)  Klor-con 20 Meq Pack (Potassium chloride) .... Take one tablet three times a day 7)  Hectorol 0.5 Mcg Caps (Doxercalciferol) .... Take 3 tablets daily. 8)  Adult Aspirin Ec Low Strength 81 Mg Tbec (Aspirin) .Marland Kitchen.. 1 by mouth once daily

## 2010-08-17 NOTE — Letter (Signed)
Summary: [facility]   Imported By: Edmonia James 01/05/2009 [id number]  _____________________________________________________________________  External Attachment:    Type:   Image     Comment:   External Document

## 2010-08-17 NOTE — Progress Notes (Signed)
Summary: SHORT TERM DISABILITY FORM FOR APRIL  Phone Note Call from Patient Call back at Home Phone [phone]   Caller: Patient Summary of Call: PATIENT DROPPED OFF [address] [place] TO [place] Initial call taken by: Berneta Sages,  Nov 21, 2009 11:03 AM  Follow-up for Phone Call        left message with pt mother paper work is ready for pick-up. forms placed up front......Marland KitchenFelecia Deloach CMA  Nov 21, 2009 2:45 PM

## 2010-08-17 NOTE — Progress Notes (Signed)
Summary: paper work  Phone Note Call from Patient   Caller: Spouse Summary of Call: Fidelis       Call Back  [phone]  patient husband dropped of another set of paper work for [facility] to fill out.......Marland Kitchenpaper given to the nurse.  ************mail back to patient****************** Initial call taken by: Vanessa Martinique,  October 04, 2008 11:23 AM  Follow-up for Phone Call        done Follow-up by: Garnet Koyanagi DO,  October 04, 2008 11:58 AM  Additional Follow-up for Phone Call Additional follow up Details #1::        Patient aware is ready and would like to pick up and  for [name] not to mail them. Carley Hammed  October 04, 2008 12:12 PM  Additional Follow-up by: Carley Hammed,  October 04, 2008 12:12 PM

## 2010-08-17 NOTE — Progress Notes (Signed)
  Phone Note Call from Patient Call back at Home Phone [phone]   Caller: Daughter Call For: Jane Koyanagi DO Summary of Call: Daughter called office patient had a stroke and wanted to let us know that we are a blessing. " Also, that Sharyn Lull was extremely compassionate, professional and just plain courteous and caring.  Dr. Etter Sjogren was a blessing and showed lots of professionalism in the situation that she was put in and acted accordingly". " Thank you for all you all do".
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
## 2010-08-17 NOTE — Letter (Signed)
Summary: [facility]   Imported By: Edmonia James 05/12/2009 13:21:08  _____________________________________________________________________  External Attachment:    Type:   Image     Comment:   External Document

## 2010-08-17 NOTE — Miscellaneous (Signed)
Summary: Orders Update  Clinical Lists Changes  Orders: Added new Referral order of Occupational Therapy (OT) - Signed

## 2010-08-17 NOTE — Letter (Signed)
Summary: Heart Failure Program Status Report/[facility]  Heart Failure Program Status Report/[facility]   Imported By: Edmonia James 06/27/2009 11:17:55  _____________________________________________________________________  External Attachment:    Type:   Image     Comment:   External Document

## 2010-08-17 NOTE — Letter (Signed)
Summary: [facility] (Report Forwarded by [facility] [place]  [facility] (Report Forwarded by [facility])   Imported By: Edmonia James 02/07/2009 11:32:18  _____________________________________________________________________  External Attachment:    Type:   Image     Comment:   External Document

## 2010-08-17 NOTE — Progress Notes (Signed)
  Phone Note Outgoing Call Call back at [facility] Phone [phone]   Call placed by: Carley Hammed,  November 11, 2008 10:15 AM Call placed to: Patient Summary of Call: Faxed over to [facility] copy of letter of medical necessity and all papers and sent copy to patient. Carley Hammed  November 11, 2008 10:21 AM

## 2010-08-17 NOTE — Letter (Signed)
Summary: Heart Failure Program Status Report/[facility]  Heart Failure Program Status Report/[facility]   Imported By: Edmonia James 05/23/2009 09:24:18  _____________________________________________________________________  External Attachment:    Type:   Image     Comment:   External Document  Appended Document: Heart Failure Program Status Report/[facility] Call pt and make sure she is ok---she lost a lot of weight in short period of time  Appended Document: Heart Failure Program Status Report/[facility] spoke with pt she said that she is doing well.

## 2010-08-17 NOTE — Progress Notes (Signed)
Summary: SHORT TERM DISABILITY PAPERWORK  Phone Note Call from Patient Call back at Home Phone [phone]   Caller: Patient Summary of Call: PATIENT DROPPED OFF [place] PAPERWORK FOR DR [place] SLEEVE AND PUT ON [facility]'S DESK Initial call taken by: Berneta Sages,  June 13, 2009 6:06 PM  Follow-up for Phone Call        forms placed on ledge awaiting signature....................Marland KitchenFelecia Deloach CMA  June 14, 2009 8:19 AM   Additional Follow-up for Phone Call Additional follow up Details #1::        forms placed up front pt aware................Marland KitchenFelecia Deloach CMA  June 14, 2009 8:36 AM

## 2010-08-17 NOTE — Letter (Signed)
Summary: Letter Concerning Heart Failure Disease Mgmt. Program/[facility]  Letter Concerning Heart Failure Disease Mgmt. Program/[facility]   Imported By: Edmonia James 11/03/2008 09:12:13  _____________________________________________________________________  External Attachment:    Type:   Image     Comment:   External Document

## 2010-08-17 NOTE — Progress Notes (Signed)
Summary: ADDITIONAL PAPERWORK FOR DISABILITY  Phone Note Call from Patient Call back at Home Phone [phone] Call back at [phone]   Caller: Spouse Call For: 1 Summary of Call: MR Jans DROPPED OFF MORE PAPERWORK---MED REPORT FOR SHORT TERM ELIGIBILITY AND MED REPORT [place] OR CELL PHONE WHEN READY FOR PICKUP Initial call taken by: Berneta Sages,  October 06, 2008 2:05 PM  Follow-up for Phone Call        On your ledge. Thanks, Carley Hammed  October 07, 2008 8:39 AM  Follow-up by: Carley Hammed,  October 07, 2008 8:39 AM  Additional Follow-up for Phone Call Additional follow up Details #1::        Patient aware papers ready to be picked up at front desk. Carley Hammed  October 07, 2008 4:48 PM  Additional Follow-up by: Carley Hammed,  October 07, 2008 4:48 PM

## 2010-08-17 NOTE — Progress Notes (Signed)
Summary: forms   Phone Note Call from Patient   Summary of Call: Called pt and informed her that her forms are ready for pick up. Lilly  August 17, 2009 2:17 PM

## 2010-08-17 NOTE — Letter (Signed)
Summary: Results Follow-up Letter  [facility]   [place], [place] [address]   Phone: [phone]  Fax: [phone]    11/11/2008        [address], [place]  [address]  Dear Ms. Whelchel,   The following are the results of your recent test(s):  Test     Result     Pap Smear    Normal_______  Not Normal_____       Comments: _________________________________________________________ Cholesterol LDL(Bad cholesterol):          Your goal is less than:         HDL (Good cholesterol):        Your goal is more than: _________________________________________________________ Other Tests:   _________________________________________________________  Please call for an appointment Or _____Please see attached.____________________________________________________ _________________________________________________________ _________________________________________________________  Sincerely,  Carley Hammed [place] at [facility]

## 2010-08-17 NOTE — Letter (Signed)
Summary: [name], Psychologist (Report Forwarded from [place], Psychologist (Report Forwarded from [place])   Imported By: Edmonia James 02/07/2009 11:39:21  _____________________________________________________________________  External Attachment:    Type:   Image     Comment:   External Document

## 2010-08-17 NOTE — Progress Notes (Signed)
Summary: ov   Phone Note Call from Patient   Caller: Patient husband fred Summary of Call: wife was discharged, in my opiniopn when some one has a stroke she needs to start PT , wife tole me that a few minutes ago, that Dr Etter Sjogren was to write orders for Physical therapy, husband said which did not make good sense to me. Basically husband wants to know who is going to write the orders for wife PT.  Wife has been walkking with the aid of a crutch but she has said her left knee around that area is hurting. wife does has a appt thurs at 11am. ov scheduled today instead since leg is bothering her and husband is concerned.Verdie Mosher  September 28, 2008 8:51 AM  Initial call taken by: Verdie Mosher,  September 28, 2008 8:51 AM

## 2010-08-17 NOTE — Miscellaneous (Signed)
Summary: Discharge/[facility]   Imported By: Edmonia James 01/27/2009 12:01:32  _____________________________________________________________________  External Attachment:    Type:   Image     Comment:   External Document

## 2010-08-17 NOTE — Assessment & Plan Note (Signed)
Summary: vomiting,nausea//lch   Vital Signs:  Patient profile:   65 year old female Height:      65 inches Weight:      246 pounds BMI:     41.08 Temp:     97.8 degrees F oral Pulse rate:   82 / minute Pulse rhythm:   regular BP sitting:   124 / 80  (left arm) Cuff size:   large  Vitals Entered By: Allyn Kenner CMA (October 04, 2009 10:49 AM) CC: Pt states she is unable to eat or drink anything since saturday. She started vomitting every day 2-3 times a day.   History of Present Illness: Pt here with husband --pt c/o N/V since Friday.  She has been unable to eat or drink anything since then.  Pt was seen by [facility] yesterday and fluid taken out of band.  After she left office she started to have abd pain so she called surgeon on call. She took peptobismol as instructed and con't to have pain---NV.  Pt presents today unable to hold any fluid or food in.  no fevers,  no diarrhea.  Current Medications (verified): 1)  Toprol Xl 50 Mg Xr24h-Tab (Metoprolol Succinate) .... Take One Tablet Twice Daily. 2)  Furosemide 80 Mg  Tabs (Furosemide) .Marland Kitchen.. 1 By Mouth Qd 3)  Sotalol Hcl (Af) 80 Mg  Tabs (Sotalol Hcl Af) .... Two Times A Day 4)  Cardizem Cd 360 Mg  Cp24 (Diltiazem Hcl Coated Beads) .... Take One Tablet Once Daily 5)  Adult Aspirin Ec Low Strength 81 Mg Tbec (Aspirin) .Marland Kitchen.. 1 By Mouth Once Daily 6)  Micardis 80 Mg Tabs (Telmisartan) .... Take One Tablet Daily 7)  Tylenol Extra Strength 500 Mg Tabs (Acetaminophen) .... As Needed 8)  Klor-Con M20 20 Meq Cr-Tabs (Potassium Chloride Crys Cr) .Marland Kitchen.. 1 By Mouth Daily 9)  Sotalol Hcl 80 Mg Tabs (Sotalol Hcl) .... 2 Tabs By Mouth Two Times A Day 10)  Allopurinol 100 Mg Tabs (Allopurinol) .... 3 Tabs By Mouth Daily 11)  Colcrys 0.6 Mg Tabs (Colchicine) .Marland Kitchen.. 1 By Mouth Daily 12)  Promethazine Hcl 25 Mg Tabs (Promethazine Hcl) .Marland Kitchen.. 1 By Mouth Qid As Needed  Allergies (verified): No Known Drug Allergies  Past History:  Past medical, surgical,  family and social histories (including risk factors) reviewed for relevance to current acute and chronic problems.  Past Medical History: Reviewed history from 11/25/2008 and no changes required. ASTHMA (ICD-493.90) ARRHYTHMIA, HX OF (ICD-V12.50) HYPERTENSION (ICD-401.9) Stress fractures in both feet Atrial fibrillation Depression  Past Surgical History: Reviewed history from 10/20/2007 and no changes required. Heart valve surgery Cholecystectomy  Family History: Reviewed history from 10/20/2007 and no changes required. Mother-Diabetes, HTN,  Father-deceased-Hodgkins Disease - 32 yrs ago  Social History: Reviewed history from 04/29/2008 and no changes required. 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   Mouth:  mucous mem dry Abdomen:  Bowel sounds positive,abdomen soft and non-tender without masses, organomegaly or hernias noted. Psych:  Oriented X3 and normally interactive.     Impression & Recommendations:  Problem # 1:  NAUSEA WITH VOMITING (ICD-787.01)  check labs pheneran given clear liquids if con't with phenergan--- pt should go to hospital  Orders: UA Dipstick w/o Micro (manual) (81002) Venipuncture [id number]) TLB-BMP (Basic Metabolic Panel-BMET) (99991111) TLB-CBC Platelet - w/Differential (85025-CBCD) TLB-Hepatic/Liver Function  Pnl (80076-HEPATIC) Radiology Referral (Radiology) Admin of Therapeutic Inj  intramuscular or subcutaneous [id number]) Promethazine up to 50mg  (J2550)  Problem # 2:  ABDOMINAL PAIN, UNSPECIFIED SITE (ICD-789.00)  cont pain meds per surgeons check CT if pain increases go to ER Orders: Venipuncture [id number]) TLB-BMP (Basic Metabolic Panel-BMET) (99991111) TLB-CBC Platelet - w/Differential (85025-CBCD) TLB-Hepatic/Liver Function Pnl  (80076-HEPATIC) Radiology Referral (Radiology)  Discussed symptom control with the patient.   Complete Medication List: 1)  Toprol Xl 50 Mg Xr24h-tab (Metoprolol succinate) .... Take one tablet twice daily. 2)  Furosemide 80 Mg Tabs (Furosemide) .Marland Kitchen.. 1 by mouth qd 3)  Sotalol Hcl (af) 80 Mg Tabs (Sotalol hcl af) .... Two times a day 4)  Cardizem Cd 360 Mg Cp24 (Diltiazem hcl coated beads) .... Take one tablet once daily 5)  Adult Aspirin Ec Low Strength 81 Mg Tbec (Aspirin) .Marland Kitchen.. 1 by mouth once daily 6)  Micardis 80 Mg Tabs (Telmisartan) .... Take one tablet daily 7)  Tylenol Extra Strength 500 Mg Tabs (Acetaminophen) .... As needed 8)  Klor-con M20 20 Meq Cr-tabs (Potassium chloride crys cr) .Marland Kitchen.. 1 by mouth daily 9)  Sotalol Hcl 80 Mg Tabs (Sotalol hcl) .... 2 tabs by mouth two times a day 10)  Allopurinol 100 Mg Tabs (Allopurinol) .... 3 tabs by mouth daily 11)  Colcrys 0.6 Mg Tabs (Colchicine) .Marland Kitchen.. 1 by mouth daily 12)  Promethazine Hcl 25 Mg Tabs (Promethazine hcl) .Marland Kitchen.. 1 by mouth qid as needed Prescriptions: PROMETHAZINE HCL 25 MG TABS (PROMETHAZINE HCL) 1 by mouth qid as needed  #30 x 0   Entered and Authorized by:   Garnet Koyanagi DO   Signed by:   Garnet Koyanagi DO on 10/04/2009   Method used:   Electronically to        [facility] # [id number]* (retail)       [address]       [place], [place]  [address]       Ph: [phone] or [phone]       Fax: [phone]   RxID:   [id number]   Laboratory Results   Urine Tests    Routine Urinalysis   Color: yellow Appearance: Clear Glucose: negative   (Normal Range: Negative) Bilirubin: negative   (Normal Range: Negative) Ketone: negative   (Normal Range: Negative) Spec. Gravity: >=1.030   (Normal Range: 1.003-1.035) Blood: negative   (Normal Range: Negative) pH: 6.0   (Normal Range: 5.0-8.0) Protein: 100   (Normal Range: Negative) Urobilinogen: 0.2   (Normal Range: 0-1) Nitrite: negative    (Normal Range: Negative) Leukocyte Esterace: negative   (Normal Range: Negative)    Comments: Allyn Kenner CMA  October 04, 2009 11:15 AM       Medication Administration  Injection # 1:    Medication: Promethazine up to 50mg     Diagnosis: NAUSEA WITH VOMITING (ICD-787.01)    Route: IM    Site: RUOQ gluteus    Exp Date: 11/2010    Lot #: [id number]    Mfr: novaplus    Patient tolerated injection without complications    Given by: Allyn Kenner CMA (October 04, 2009 11:57 AM)  Orders Added: 1)  Est. Patient Level III [id number] 2)  UA Dipstick w/o Micro (manual) [81002] 3)  Venipuncture [id number] 4)  TLB-BMP (Basic Metabolic Panel-BMET) [id number] 5)  TLB-CBC Platelet - w/Differential [85025-CBCD] 6)  TLB-Hepatic/Liver Function Pnl [80076-HEPATIC] 7)  Radiology Referral [Radiology] 8)  Admin of Therapeutic  Inj  intramuscular or subcutaneous [96372] 9)  Promethazine up to 50mg  [J2550]

## 2010-08-17 NOTE — Letter (Signed)
Summary: [facility]   Imported By: Edmonia James 12/01/2008 14:03:58  _____________________________________________________________________  External Attachment:    Type:   Image     Comment:   External Document

## 2010-08-17 NOTE — Miscellaneous (Signed)
Summary: Initial Summary/[facility]   Imported By: Edmonia James 11/05/2008 12:37:43  _____________________________________________________________________  External Attachment:    Type:   Image     Comment:   External Document

## 2010-08-17 NOTE — Letter (Signed)
Summary: [facility]   Imported By: Edmonia James 10/15/2009 11:13:37  _____________________________________________________________________  External Attachment:    Type:   Image     Comment:   External Document

## 2010-08-17 NOTE — Letter (Signed)
Summary: [facility]   Imported By: Edmonia James 05/15/2010 13:23:07  _____________________________________________________________________  External Attachment:    Type:   Image     Comment:   External Document

## 2010-08-17 NOTE — Letter (Signed)
Summary: Heart Failure Program Status Report/[facility]  Heart Failure Program Status Report/[facility]   Imported By: Edmonia James 05/26/2009 13:11:54  _____________________________________________________________________  External Attachment:    Type:   Image     Comment:   External Document

## 2010-08-17 NOTE — Letter (Signed)
Summary: [facility]   Imported By: Edmonia James 12/01/2009 14:17:08  _____________________________________________________________________  External Attachment:    Type:   Image     Comment:   External Document

## 2010-08-17 NOTE — Assessment & Plan Note (Signed)
Summary: hosp follow-up//fd   Vital Signs:  Patient profile:   65 year old female Height:      65 inches Weight:      296.38 pounds BMI:     49.50 Temp:     98.0 degrees F oral Pulse rate:   78 / minute Resp:     18 per minute BP sitting:   140 / 90  (right arm)  Vitals Entered By: Carley Hammed (Nov 25, 2008 10:36 AM) CC: Hospital Follow Up Is Patient Diabetic? No Pain Assessment Patient in pain? no       Have you ever been in a relationship where you felt threatened, hurt or afraid?No   Does patient need assistance? Functional Status Self care Ambulation Normal   History of Present Illness: Pt here f/u hospital.  Pt admitted with ARF.  Pt seeing nephrology and cardio.  Labs being followed by nephrology.  They are also managing BP.  No complaints.  Pt waiting for lap band surgery.  D/C summary reviewed.  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 Take One Tablet Once Daily 2)  Lisinopril 40 Mg  Tabs (Lisinopril) .... One Tablet Twice Daily. 3)  Furosemide 80 Mg  Tabs (Furosemide) .... Three Times A Day 4)  Sotalol Hcl (Af) 80 Mg  Tabs (Sotalol Hcl Af) .... Two Times A Day 5)  Cardizem Cd 360 Mg  Cp24 (Diltiazem Hcl Coated Beads) .... Take One Tablet Once Daily 6)  Klor-Con 20 Meq Pack (Potassium Chloride) .... Take One and One Half Tablet Two Times A Day  7)  Hectorol 0.5 Mcg Caps (Doxercalciferol) .... Take 3 Tablets Daily. 8)  Adult Aspirin Ec Low Strength 81 Mg Tbec (Aspirin) .Marland Kitchen.. 1 By Mouth Once Daily  Current Medications (verified): 1)  Toprol Xl 50 Mg Xr24h-Tab (Metoprolol Succinate) .... Take One Tablet Twice Daily. 2)  Furosemide 80 Mg  Tabs (Furosemide) .Marland Kitchen.. 1 By Mouth Qd 3)  Sotalol Hcl (Af) 80 Mg  Tabs (Sotalol Hcl Af) .... Two Times A Day 4)  Cardizem Cd 360 Mg  Cp24 (Diltiazem Hcl Coated Beads) .... Take One Tablet Once Daily 5)  Hectorol 0.5 Mcg Caps (Doxercalciferol) .... Take 3 Tablets Daily. 6)  Adult Aspirin Ec Low Strength 81 Mg Tbec (Aspirin) .Marland Kitchen.. 1 By Mouth Once Daily 7)  Micardis 80 Mg Tabs (Telmisartan) .... Take One Tablet Daily 8)  Tylenol Extra Strength 500 Mg Tabs (Acetaminophen) .... As Needed  Allergies (verified): No Known Drug Allergies  Past History:  Past medical, surgical, family and social histories (including risk factors) reviewed, and no changes noted (except as noted below).  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  Lungs:  Normal respiratory effort, chest expands symmetrically. Lungs are clear to auscultation, no crackles or wheezes. Heart:  normal rate and regular rhythm.   Extremities:  trace left pedal edema and trace right pedal edema.   Psych:  Oriented X3 and normally interactive.     Impression & Recommendations:  Problem # 1:  HYPERTENSION (ICD-401.9)  PER NEPHRO The following medications were removed from the medication list:    Lisinopril 40 Mg Tabs (Lisinopril) ..... One tablet twice daily. Her updated medication list for this problem includes:    Toprol Xl 50 Mg Xr24h-tab (Metoprolol succinate) .Marland Kitchen... Take one tablet twice daily.    Furosemide 80 Mg Tabs (Furosemide) .Marland Kitchen... 1 by mouth qd    Sotalol Hcl (af) 80 Mg Tabs (Sotalol hcl af) .Marland Kitchen..Marland Kitchen Two times a day    Cardizem Cd 360 Mg Cp24 (Diltiazem hcl coated beads) .Marland Kitchen... Take one tablet once daily    Micardis 80 Mg Tabs (Telmisartan) .Marland Kitchen... Take one tablet daily  BP today: 140/90 Prior BP: 160/98 (11/03/2008)  Prior 10 Yr Risk Heart Disease: Not enough information (10/20/2007)  Problem # 2:  RENAL FAILURE, ACUTE (ICD-584.9) Assessment: Improved PER NEPHRO  Problem # 3:  CVA (ICD-434.91)  Her updated medication list for this problem includes:    Adult Aspirin Ec Low Strength 81 Mg Tbec (Aspirin) .Marland Kitchen... 1 by mouth once daily  Problem # 4:  ASTHMA (ICD-493.90) Assessment: Unchanged  Problem # 5:  MORBID OBESITY (ICD-278.01)  LAB BAND SURGERY PENDING CON'T WITH DIET AND EXERCISE---PT GOING TO WATER AEROBICS TID  Ht: 65 (11/25/2008)   Wt: 296.38 (11/25/2008)   BMI: 49.50 (11/25/2008)  Orders: Nutrition Referral (Nutrition)  Complete Medication List: 1)  Toprol Xl 50 Mg Xr24h-tab (Metoprolol succinate) .... Take one tablet twice daily. 2)  Furosemide 80 Mg Tabs (Furosemide) .Marland Kitchen.. 1 by mouth qd 3)  Sotalol Hcl (af) 80 Mg Tabs (Sotalol hcl af) .... Two times a day  4)  Cardizem Cd 360 Mg Cp24 (Diltiazem hcl coated beads) .... Take one tablet once daily 5)  Hectorol 0.5 Mcg Caps (Doxercalciferol) .... Take 3 tablets daily. 6)  Adult Aspirin Ec Low Strength 81 Mg Tbec (Aspirin) .Marland Kitchen.. 1 by mouth once daily 7)  Micardis 80 Mg Tabs (Telmisartan) .... Take one tablet daily 8)  Tylenol Extra Strength 500 Mg Tabs (Acetaminophen) .... As needed

## 2010-08-17 NOTE — Letter (Signed)
Summary: Letter Concerning Heart Failure Disease Mgmt. Program/[facility]  Letter Concerning Heart Failure Disease Mgmt. Program/[facility]   Imported By: Edmonia James 08/23/2008 09:35:30  _____________________________________________________________________  External Attachment:    Type:   Image     Comment:   External Document

## 2010-08-17 NOTE — Assessment & Plan Note (Signed)
Summary: CPX AND LABS   Vital Signs:  Patient Profile:   65 Years Old Female Height:     65 inches Weight:      296.13 pounds Temp:     97.9 degrees F oral Pulse rate:   72 / minute Resp:     18 per minute BP sitting:   140 / 90  (right arm)  Pt. in pain?   no  Vitals Entered By: Carley Hammed (April 29, 2008 8:32 AM)               Vision Screening: Left eye w/o correction: 20 / 20 Right Eye w/o correction: 20 / 20 Both eyes w/o correction:  20/ 20        Flu Vaccine Consent Questions     Do you have a history of severe allergic reactions to this vaccine? no    Any prior history of allergic reactions to egg and/or gelatin? no    Do you have a sensitivity to the preservative Thimersol? no    Do you have a past history of Guillan-Barre Syndrome? no    Do you currently have an acute febrile illness? no    Have you ever had a severe reaction to latex? no    Vaccine information given and explained to patient? yes    Are you currently pregnant? no    Lot Number:[id number]   Site Given  right Deltoid IM   PCP:  [facility]  Chief Complaint:  CPX-Fasting Labs and No Pap.  History of Present Illness: Pt here for CpE, no pap.  Pt is interested in Lap Band surgery and she has been to the info mtg.  She has not decided on a surgeon yet.   Pt is on CPAP per her Cardio and is sleeping much better.  Pt is under a lot of stress with home and work.  No other complaints.    Current Allergies: No known allergies   Past Medical History:    Reviewed history from 10/20/2007 and no changes required:       ASTHMA (ICD-493.90)       ARRHYTHMIA, HX OF (ICD-V12.50)       HYPERTENSION (ICD-401.9)       Stress fractures in both feet         Past Surgical History:    Reviewed history from 10/20/2007 and no changes required:       Heart valve surgery       Cholecystectomy   Family History:    Reviewed history from 10/20/2007 and no changes required:       Mother-Diabetes, HTN,         Father-deceased-Hodgkins Disease - 32 yrs ago  Social History:    Reviewed history from 10/20/2007 and no changes required:       Occupation:  Web designer [employer]       Married       Never Smoked       Alcohol use-no       Drug use-no              Regular exercise-yes   Risk Factors:  Tobacco use:  never Passive smoke exposure:  no Drug use:  no HIV high-risk behavior:  no Caffeine use:  2 drinks per day Alcohol use:  no Exercise:  yes    Times per week:  3    Type:  gym, water aerobic Seatbelt use:  100 % Sun Exposure:  rarely  Family History Risk Factors:  Family History of MI in females < 57 years old:  no    Family History of MI in males < 36 years old:  no  PAP Smear History:     Date of Last PAP Smear:  12/31/2007    Results:  Normal--  Dr Irven [place]   Mammogram History:     Date of Last Mammogram:  11/26/2007    Results:  Normal Bilateral   Colonoscopy History:     Date of Last Colonoscopy:  07/30/2007    Results:  normal--- repeat 10 years---Dr Collene Mares    Review of Systems      See HPI  General      Denies chills, fatigue, fever, loss of appetite, malaise, sleep disorder, sweats, weakness, and weight loss.  Eyes      Denies blurring, discharge, double vision, eye irritation, eye pain, halos, itching, light sensitivity, red eye, vision loss-1 eye, and vision loss-both eyes.      optho--q2y  ENT      Denies decreased hearing, difficulty swallowing, ear discharge, earache, hoarseness, nasal congestion, nosebleeds, postnasal drainage, ringing in ears, sinus pressure, and sore throat.      dentist--needs new one  CV      Denies bluish discoloration of lips or nails, chest pain or discomfort, difficulty breathing at night, difficulty breathing while lying down, fainting, fatigue, leg cramps with exertion, lightheadness, near fainting, palpitations, shortness of breath with exertion, swelling of feet, swelling of hands, and weight gain.       Cardio-- Dr Radford Pax nephro--[facility]  Resp      Denies chest discomfort, chest pain with inspiration, cough, coughing up blood, excessive snoring, hypersomnolence, morning headaches, pleuritic, shortness of breath, sputum productive, and wheezing.  GI      Denies abdominal pain, bloody stools, change in bowel habits, constipation, dark tarry stools, diarrhea, excessive appetite, gas, hemorrhoids, indigestion, loss of appetite, nausea, vomiting, vomiting blood, and yellowish skin color.  GU      Denies abnormal vaginal bleeding, decreased libido, discharge, dysuria, genital sores, hematuria, incontinence, nocturia, urinary frequency, and urinary hesitancy.  MS      Denies joint pain, joint redness, joint swelling, loss of strength, low back pain, mid back pain, muscle aches, muscle , cramps, muscle weakness, stiffness, and thoracic pain.  Derm      Denies changes in color of skin, changes in nail beds, dryness, excessive perspiration, flushing, hair loss, insect bite(s), itching, lesion(s), poor wound healing, and rash.  Neuro      Denies brief paralysis, difficulty with concentration, disturbances in coordination, falling down, headaches, inability to speak, memory loss, numbness, poor balance, seizures, sensation of room spinning, tingling, tremors, visual disturbances, and weakness.  Psych      Denies alternate hallucination ( auditory/visual), anxiety, depression, easily angered, easily tearful, irritability, mental problems, panic attacks, sense of great danger, suicidal thoughts/plans, thoughts of violence, unusual visions or sounds, and thoughts /plans of harming others.  Endo      Denies cold intolerance, excessive hunger, excessive thirst, excessive urination, heat intolerance, polyuria, and weight change.  Heme      Denies abnormal bruising, bleeding, enlarge lymph nodes, fevers, pallor, and skin discoloration.  Allergy       Denies hives or rash, itching eyes, persistent infections, seasonal allergies, and sneezing.   Physical Exam  General:     Well-developed,well-nourished,in no acute distress; alert,appropriate and cooperative throughout examination Head:     Normocephalic and atraumatic without obvious abnormalities. No apparent alopecia or balding.  Eyes:     vision grossly intact, pupils equal, pupils round, pupils reactive to light, and no injection.   Ears:     External ear exam shows no significant lesions or deformities.  Otoscopic examination reveals clear canals, tympanic membranes are intact bilaterally without bulging, retraction, inflammation or discharge. Hearing is grossly normal bilaterally. Nose:     External nasal examination shows no deformity or inflammation. Nasal mucosa are pink and moist without lesions or exudates. Mouth:     Oral mucosa and oropharynx without lesions or exudates.  Teeth in good repair. Neck:     No deformities, masses, or tenderness noted. Chest Wall:     No deformities, masses, or tenderness noted. Breasts:     gyn Lungs:     Normal respiratory effort, chest expands symmetrically. Lungs are clear to auscultation, no crackles or wheezes. Heart:     normal rate, regular rhythm, and no murmur.   Abdomen:     Bowel sounds positive,abdomen soft and non-tender without masses, organomegaly or hernias noted. Rectal:     gyn Genitalia:     gyn Msk:     normal ROM, no joint tenderness, no joint swelling, no joint warmth, no redness over joints, no joint deformities, no joint instability, and no crepitation.   Pulses:     R posterior tibial normal, R dorsalis pedis normal, R carotid normal, L posterior tibial normal, L dorsalis pedis normal, and L carotid normal.   Extremities:     No clubbing, cyanosis, edema, or deformity noted with normal full range of motion of all joints.   Neurologic:      No cranial nerve deficits noted. Station and gait are normal. Plantar reflexes are down-going bilaterally. DTRs are symmetrical throughout. Sensory, motor and coordinative functions appear intact. Skin:     Intact without suspicious lesions or rashes Cervical Nodes:     No lymphadenopathy noted Axillary Nodes:     No palpable lymphadenopathy Psych:     Cognition and judgment appear intact. Alert and cooperative with normal attention span and concentration. No apparent delusions, illusions, hallucinations    Impression & Recommendations:  Problem # 1:  [name] (ICD-V70.0) ghm utd check labs mammo and pap per gyn  Orders: EKG w/ Interpretation (93000)   Problem # 2:  HYPERTENSION (ICD-401.9)  Her updated medication list for this problem includes:    Toprol Xl 100 Mg Tb24 (Metoprolol succinate)    Lisinopril 40 Mg Tabs (Lisinopril) ..... Once daily    Furosemide 80 Mg Tabs (Furosemide) .Marland Kitchen..Marland Kitchen Two times a day    Sotalol Hcl (af) 80 Mg Tabs (Sotalol hcl af) .Marland Kitchen..Marland Kitchen Two times a day    Cardizem Cd 360 Mg Cp24 (Diltiazem hcl coated beads)  BP today: 140/90 Prior BP: 120/70 (10/20/2007)  Prior 10 Yr Risk Heart Disease: Not enough information (10/20/2007)  Orders: EKG w/ Interpretation (93000)   Problem # 3:  ASTHMA (ICD-493.90) Assessment: Unchanged controlled  Problem # 4:  MORBID OBESITY (ICD-278.01) pt interested in lap band surgery d/w pt diet and exercise Orders: EKG w/ Interpretation (93000)   Complete Medication List: 1)  Toprol Xl 100 Mg Tb24 (Metoprolol succinate) 2)  Lisinopril 40 Mg Tabs (Lisinopril) .... Once daily 3)  Coumadin 7.5 Mg Tabs (Warfarin sodium) .... As directed 4)  Furosemide 80 Mg Tabs (Furosemide) .... Two times a day 5)  Sotalol Hcl (af) 80 Mg Tabs (Sotalol hcl af) .... Two times a day 6)  Cardizem Cd 360 Mg Cp24 (Diltiazem  hcl coated beads)  7)  Klor-con 20 Meq Pack (Potassium chloride) .... Take one tablet three times a day 8)  Wellbutrin Xl 150 Mg Tb24 (Bupropion hcl) .Marland Kitchen.. 1 by mouth once daily 9)  Hectorol 0.5 Mcg Caps (Doxercalciferol) .... Take 2 tablets daily. 10)  Lotrisone 1-0.05 % Crea (Clotrimazole-betamethasone) .... Apply to affected area two times a day  Other Orders: Admin 1st Vaccine [id number]) Flu Vaccine 12yrs + [id number])    Prescriptions: LOTRISONE 1-0.05 % CREA (CLOTRIMAZOLE-BETAMETHASONE) apply to affected area two times a day  #30 g x 2   Entered and Authorized by:   Garnet Koyanagi DO   Signed by:   Garnet Koyanagi DO on 04/29/2008   Method used:   Electronically to        [facility] # [id number]* (retail)       [address]       [place], [place]  [address]       Ph: [phone] or [phone]       Fax: [phone]   RxID:   [id number]  ]  EKG  Procedure date:  04/29/2008  Findings:      Sinus bradycardia with rate of:  54 bpm    EKG  Procedure date:  04/29/2008  Findings:      Sinus bradycardia with rate of:  54 bpm     Pneumovax Immunization History:    Pneumovax # 1:  Historical (04/25/2005)

## 2010-08-17 NOTE — Letter (Signed)
Summary: List of Patients Weight from 10-13-04 thru 05-03-08/[facility]  List of Patients Weight from 10-13-04 thru 05-03-08/[facility]   Imported By: Edmonia James 12/01/2008 14:15:45  _____________________________________________________________________  External Attachment:    Type:   Image     Comment:   External Document

## 2010-08-17 NOTE — Letter (Signed)
Summary: Heart Failure Program Alert Report/[facility]  Heart Failure Program Alert Report/[facility]   Imported By: Edmonia James 09/23/2008 11:36:23  _____________________________________________________________________  External Attachment:    Type:   Image     Comment:   External Document

## 2010-08-17 NOTE — Progress Notes (Signed)
Summary: SHORT TERM DISABILITY PAPERWORK TO COMPLETE  Phone Note Call from Patient Call back at Home Phone [phone]   Caller: Patient Summary of Call: PATIENT DROPPED OFF [place] PAPERWORK FOR DR [place] IT IS READY FOR PICKUP  WILL PLACE IN PLASTIC SLEEVE AND PUT ON FELICIA'S DESK Initial call taken by: Berneta Sages,  March 17, 2009 4:16 PM  Follow-up for Phone Call        forms placed on ledge....................Marland KitchenFelecia Deloach CMA  March 18, 2009 11:41 AM  Additional Follow-up for Phone Call Additional follow up Details #1::        done Additional Follow-up by: Garnet Koyanagi DO,  March 19, 2009 4:27 PM    Additional Follow-up for Phone Call Additional follow up Details #2::    pt aware papers ready for pick-up..................Marland KitchenFelecia Deloach CMA  March 23, 2009 11:55 AM

## 2010-09-27 ENCOUNTER — Encounter: Payer: Self-pay | Admitting: Internal Medicine

## 2010-09-27 ENCOUNTER — Ambulatory Visit (INDEPENDENT_AMBULATORY_CARE_PROVIDER_SITE_OTHER): Payer: Self-pay | Admitting: Internal Medicine

## 2010-09-27 DIAGNOSIS — J019 Acute sinusitis, unspecified: Secondary | ICD-10-CM | POA: Insufficient documentation

## 2010-09-27 DIAGNOSIS — J209 Acute bronchitis, unspecified: Secondary | ICD-10-CM

## 2010-10-03 NOTE — Assessment & Plan Note (Signed)
Summary: cough/cbs   Vital Signs:  Patient profile:   65 year old female Weight:      260.6 pounds BMI:     43.52 Temp:     97.7 degrees F oral Pulse rate:   72 / minute Resp:     14 per minute BP sitting:   128 / 80  (left arm) Cuff size:   large  Vitals Entered By: Georgette Dover CMA (September 27, 2010 11:35 AM) CC: Cough and sinus drainage, URI symptoms   Primary Care Provider:  Etter Sjogren  CC:  Cough and sinus drainage and URI symptoms.  History of Present Illness:    Onset as itchy scratchy throat with PNdrainage & dry cough.She now  reports nasal congestion, purulent nasal discharge, and productive cough, but denies earache.  Associated symptoms include wheezing.  The patient denies fever and dyspnea.  The patient denies  frontal headache.  Risk factors for Strep sinusitis include bilateral facial pain, L > R.  The patient denies the following risk factors for Strep sinusitis: tooth pain and tender adenopathy.  Rx: OTC sinus pills. PMH of asthma in childhood; non smoking  Current Medications (verified): 1)  Toprol Xl 100 Mg Xr24h-Tab (Metoprolol Succinate) .Marland Kitchen.. 1 By Mouth Once Daily 2)  Furosemide 80 Mg  Tabs (Furosemide) .Marland Kitchen.. 1 By Mouth Qd 3)  Sotalol Hcl (Af) 80 Mg  Tabs (Sotalol Hcl Af) .Marland Kitchen.. 1 By Mouth Two Times A Day 4)  Cardizem Cd 300 Mg Xr24h-Cap (Diltiazem Hcl Coated Beads) .Marland Kitchen.. 1 By Mouth Once Daily 5)  Adult Aspirin Ec Low Strength 81 Mg Tbec (Aspirin) .Marland Kitchen.. 1 By Mouth Once Daily 6)  Micardis 80 Mg Tabs (Telmisartan) .... Take One Tablet Daily 7)  Tylenol Extra Strength 500 Mg Tabs (Acetaminophen) .... As Needed 8)  Klor-Con M20 20 Meq Cr-Tabs (Potassium Chloride Crys Cr) .Marland Kitchen.. 1 By Mouth Daily 9)  Allopurinol 100 Mg Tabs (Allopurinol) .... 3 Tabs By Mouth Daily 10)  Colcrys 0.6 Mg Tabs (Colchicine) .Marland Kitchen.. 1 By Mouth Daily 11)  Clotrimazole-Betamethasone 1-0.05 % Crea (Clotrimazole-Betamethasone) .... Apply To Affected Area Two Times A Day 12)  Hectorol 0.5 Mcg Caps  (Doxercalciferol) .Marland Kitchen.. 1 By Mouth Once Daily  Allergies (verified): No Known Drug Allergies  Physical Exam  General:  well-nourished,in no acute distress; alert,appropriate and cooperative throughout examination Ears:  External ear exam shows no significant lesions or deformities.  Otoscopic examination reveals clear canals, tympanic membranes are intact bilaterally without bulging, retraction, inflammation or discharge. Hearing is grossly normal bilaterally. Nose:  External nasal examination shows no deformity or inflammation. Nasal mucosa are pink and moist without lesions or exudates. Mouth:  Oral mucosa and oropharynx without lesions or exudates.  Teeth in good repair. Lungs:  Normal respiratory effort, chest expands symmetrically. Lungs are clear to auscultation, no crackles or wheezes but rattly cough. Heart:  Normal rate and regular rhythm. S1 and S2 normal without gallop, murmur, click, rub . Extremities:  No clubbing, cyanosis. Cervical Nodes:  No lymphadenopathy noted Axillary Nodes:  No palpable lymphadenopathy   Impression & Recommendations:  Problem # 1:  SINUSITIS- ACUTE-NOS (ICD-461.9)  Her updated medication list for this problem includes:    Amoxicillin 500 Mg Caps (Amoxicillin) .Marland Kitchen... 1 three times a day    Hydromet 5-1.5 Mg/46ml Syrp (Hydrocodone-homatropine) .Marland Kitchen... 1 tsp every 6 hrs as needed  Problem # 2:  BRONCHITIS-ACUTE (ICD-466.0)  Her updated medication list for this problem includes:    Amoxicillin 500 Mg Caps (Amoxicillin) .Marland Kitchen... 1 three  times a day    Hydromet 5-1.5 Mg/69ml Syrp (Hydrocodone-homatropine) .Marland Kitchen... 1 tsp every 6 hrs as needed  Complete Medication List: 1)  Toprol Xl 100 Mg Xr24h-tab (Metoprolol succinate) .Marland Kitchen.. 1 by mouth once daily 2)  Furosemide 80 Mg Tabs (Furosemide) .Marland Kitchen.. 1 by mouth qd 3)  Sotalol Hcl (af) 80 Mg Tabs (Sotalol hcl af) .Marland Kitchen.. 1 by mouth two times a day 4)  Cardizem Cd 300 Mg Xr24h-cap (Diltiazem hcl coated beads) .Marland Kitchen.. 1 by mouth  once daily 5)  Adult Aspirin Ec Low Strength 81 Mg Tbec (Aspirin) .Marland Kitchen.. 1 by mouth once daily 6)  Micardis 80 Mg Tabs (Telmisartan) .... Take one tablet daily 7)  Tylenol Extra Strength 500 Mg Tabs (Acetaminophen) .... As needed 8)  Klor-con M20 20 Meq Cr-tabs (Potassium chloride crys cr) .Marland Kitchen.. 1 by mouth daily 9)  Allopurinol 100 Mg Tabs (Allopurinol) .... 3 tabs by mouth daily 10)  Colcrys 0.6 Mg Tabs (Colchicine) .Marland Kitchen.. 1 by mouth daily 11)  Clotrimazole-betamethasone 1-0.05 % Crea (Clotrimazole-betamethasone) .... Apply to affected area two times a day 12)  Hectorol 0.5 Mcg Caps (Doxercalciferol) .Marland Kitchen.. 1 by mouth once daily 13)  Amoxicillin 500 Mg Caps (Amoxicillin) .Marland Kitchen.. 1 three times a day 14)  Hydromet 5-1.5 Mg/68ml Syrp (Hydrocodone-homatropine) .Marland Kitchen.. 1 tsp every 6 hrs as needed  Patient Instructions: 1)  Neti pot once daily two times a day as needed for congestion. 2)  Drink as much NON dairy  fluid as you can tolerate for the next few days. Prescriptions: HYDROMET 5-1.5 MG/5ML SYRP (HYDROCODONE-HOMATROPINE) 1 tsp every 6 hrs as needed  #120cc x 0   Entered and Authorized by:   Unice Cobble MD   Signed by:   Unice Cobble MD on 09/27/2010   Method used:   Printed then faxed to ...       [facility] # [id number]* (retail)       [address]       [place], [place]  [address]       Ph: [phone]       Fax: [phone]   RxID:   [id number] AMOXICILLIN 500 MG CAPS (AMOXICILLIN) 1 three times a day  #30 x 0   Entered and Authorized by:   Unice Cobble MD   Signed by:   Unice Cobble MD on 09/27/2010   Method used:   Printed then faxed to ...       [facility] # [id number]* (retail)       [address]       [place], [place]  [address]       Ph: [phone]       Fax: [phone]   RxID:   [id number]    Orders Added: 1)  Est. Patient Level III [id number]

## 2010-10-09 LAB — URINALYSIS, ROUTINE W REFLEX MICROSCOPIC
Glucose, UA: NEGATIVE mg/dL
Ketones, ur: NEGATIVE mg/dL
Leukocytes, UA: NEGATIVE
Nitrite: NEGATIVE
Protein, ur: >300 mg/dL — AB
Specific Gravity, Urine: 1.019 (ref 1.005–1.030)
Urobilinogen, UA: 0.2 mg/dL (ref 0.0–1.0)
pH: 5.5 (ref 5.0–8.0)

## 2010-10-09 LAB — LIPASE, BLOOD
Lipase: 136 U/L — ABNORMAL HIGH (ref 11–59)
Lipase: 88 U/L — ABNORMAL HIGH (ref 11–59)

## 2010-10-09 LAB — COMPREHENSIVE METABOLIC PANEL
ALT: 20 U/L (ref 0–35)
AST: 26 U/L (ref 0–37)
Albumin: 3.7 g/dL (ref 3.5–5.2)
Alkaline Phosphatase: 97 U/L (ref 39–117)
BUN: 52 mg/dL — ABNORMAL HIGH (ref 6–23)
CO2: 33 mEq/L — ABNORMAL HIGH (ref 19–32)
Calcium: 9.2 mg/dL (ref 8.4–10.5)
Chloride: 102 mEq/L (ref 96–112)
Creatinine, Ser: 3.33 mg/dL — ABNORMAL HIGH (ref 0.4–1.2)
GFR calc Af Amer: 17 mL/min — ABNORMAL LOW (ref >60)
GFR calc non Af Amer: 14 mL/min — ABNORMAL LOW (ref >60)
Glucose, Bld: 113 mg/dL — ABNORMAL HIGH (ref 70–99)
Potassium: 4.1 mEq/L (ref 3.5–5.1)
Sodium: 143 mEq/L (ref 135–145)
Total Bilirubin: 0.8 mg/dL (ref 0.3–1.2)
Total Protein: 8 g/dL (ref 6.0–8.3)

## 2010-10-09 LAB — CBC
HCT: 36.2 % (ref 36.0–46.0)
HCT: 39.8 % (ref 36.0–46.0)
Hemoglobin: 11.7 g/dL — ABNORMAL LOW (ref 12.0–15.0)
Hemoglobin: 12.5 g/dL (ref 12.0–15.0)
MCHC: 31.4 g/dL (ref 30.0–36.0)
MCHC: 32.4 g/dL (ref 30.0–36.0)
MCV: 87.9 fL (ref 78.0–100.0)
MCV: 88 fL (ref 78.0–100.0)
Platelets: 215 10*3/uL (ref 150–400)
Platelets: 257 10*3/uL (ref 150–400)
RBC: 4.11 MIL/uL (ref 3.87–5.11)
RBC: 4.53 MIL/uL (ref 3.87–5.11)
RDW: 16 % — ABNORMAL HIGH (ref 11.5–15.5)
RDW: 16.4 % — ABNORMAL HIGH (ref 11.5–15.5)
WBC: 11.7 10*3/uL — ABNORMAL HIGH (ref 4.0–10.5)
WBC: 13.1 10*3/uL — ABNORMAL HIGH (ref 4.0–10.5)

## 2010-10-09 LAB — URINE MICROSCOPIC-ADD ON

## 2010-10-09 LAB — DIFFERENTIAL
Basophils Absolute: 0 10*3/uL (ref 0.0–0.1)
Basophils Relative: 0 % (ref 0–1)
Eosinophils Absolute: 0 10*3/uL (ref 0.0–0.7)
Eosinophils Relative: 0 % (ref 0–5)
Lymphocytes Relative: 13 % (ref 12–46)
Lymphs Abs: 1.7 10*3/uL (ref 0.7–4.0)
Monocytes Absolute: 1 10*3/uL (ref 0.1–1.0)
Monocytes Relative: 8 % (ref 3–12)
Neutro Abs: 10.3 10*3/uL — ABNORMAL HIGH (ref 1.7–7.7)
Neutrophils Relative %: 79 % — ABNORMAL HIGH (ref 43–77)

## 2010-10-09 LAB — BASIC METABOLIC PANEL
BUN: 41 mg/dL — ABNORMAL HIGH (ref 6–23)
CO2: 31 mEq/L (ref 19–32)
Calcium: 8.5 mg/dL (ref 8.4–10.5)
Chloride: 108 mEq/L (ref 96–112)
Creatinine, Ser: 2.62 mg/dL — ABNORMAL HIGH (ref 0.4–1.2)
GFR calc Af Amer: 22 mL/min — ABNORMAL LOW (ref >60)
GFR calc non Af Amer: 19 mL/min — ABNORMAL LOW (ref >60)
Glucose, Bld: 111 mg/dL — ABNORMAL HIGH (ref 70–99)
Potassium: 3.4 mEq/L — ABNORMAL LOW (ref 3.5–5.1)
Sodium: 146 mEq/L — ABNORMAL HIGH (ref 135–145)

## 2010-10-10 ENCOUNTER — Encounter: Payer: Self-pay | Admitting: Family Medicine

## 2010-10-10 ENCOUNTER — Ambulatory Visit (INDEPENDENT_AMBULATORY_CARE_PROVIDER_SITE_OTHER)
Admission: RE | Admit: 2010-10-10 | Discharge: 2010-10-10 | Disposition: A | Discharge status: Home / Self Care | Payer: 59 | Source: Ambulatory Visit | Attending: Family Medicine | Admitting: Family Medicine

## 2010-10-10 ENCOUNTER — Ambulatory Visit (INDEPENDENT_AMBULATORY_CARE_PROVIDER_SITE_OTHER): Payer: 59 | Admitting: Family Medicine

## 2010-10-10 VITALS — BP 150/90 | HR 78 | Temp 97.1°F | Wt 252.0 lb

## 2010-10-10 DIAGNOSIS — F329 Major depressive disorder, single episode, unspecified: Secondary | ICD-10-CM

## 2010-10-10 DIAGNOSIS — M79604 Pain in right leg: Secondary | ICD-10-CM | POA: Insufficient documentation

## 2010-10-10 DIAGNOSIS — M545 Low back pain, unspecified: Secondary | ICD-10-CM

## 2010-10-10 IMAGING — CR DG HIP COMPLETE 2+V*R*
3 series · 3 of 3 positions shown · non-contrast
Comparison: CT abdomen pelvis of [DATE]

CLINICAL DATA: Right hip pain radiating down right leg, no injury

RIGHT HIP - COMPLETE 2+ VIEW

[view not recorded (1 of 3)]
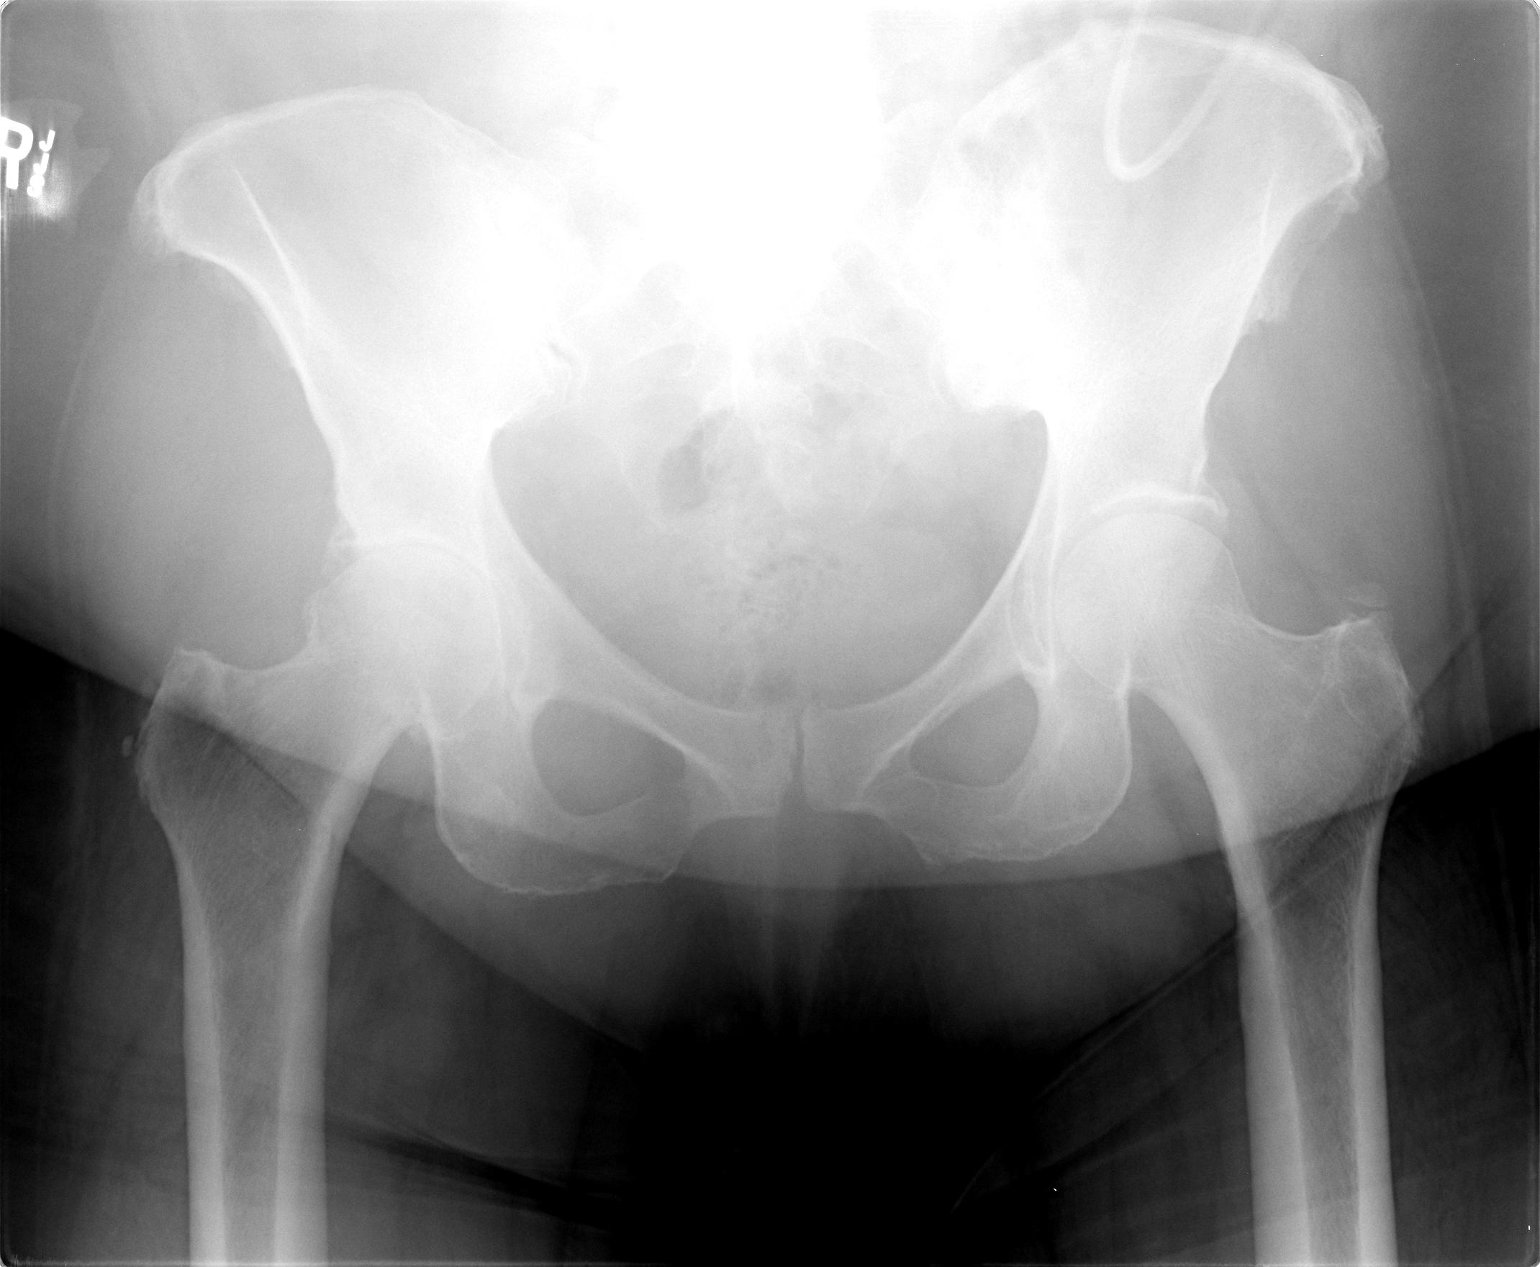

[view not recorded (2 of 3)]
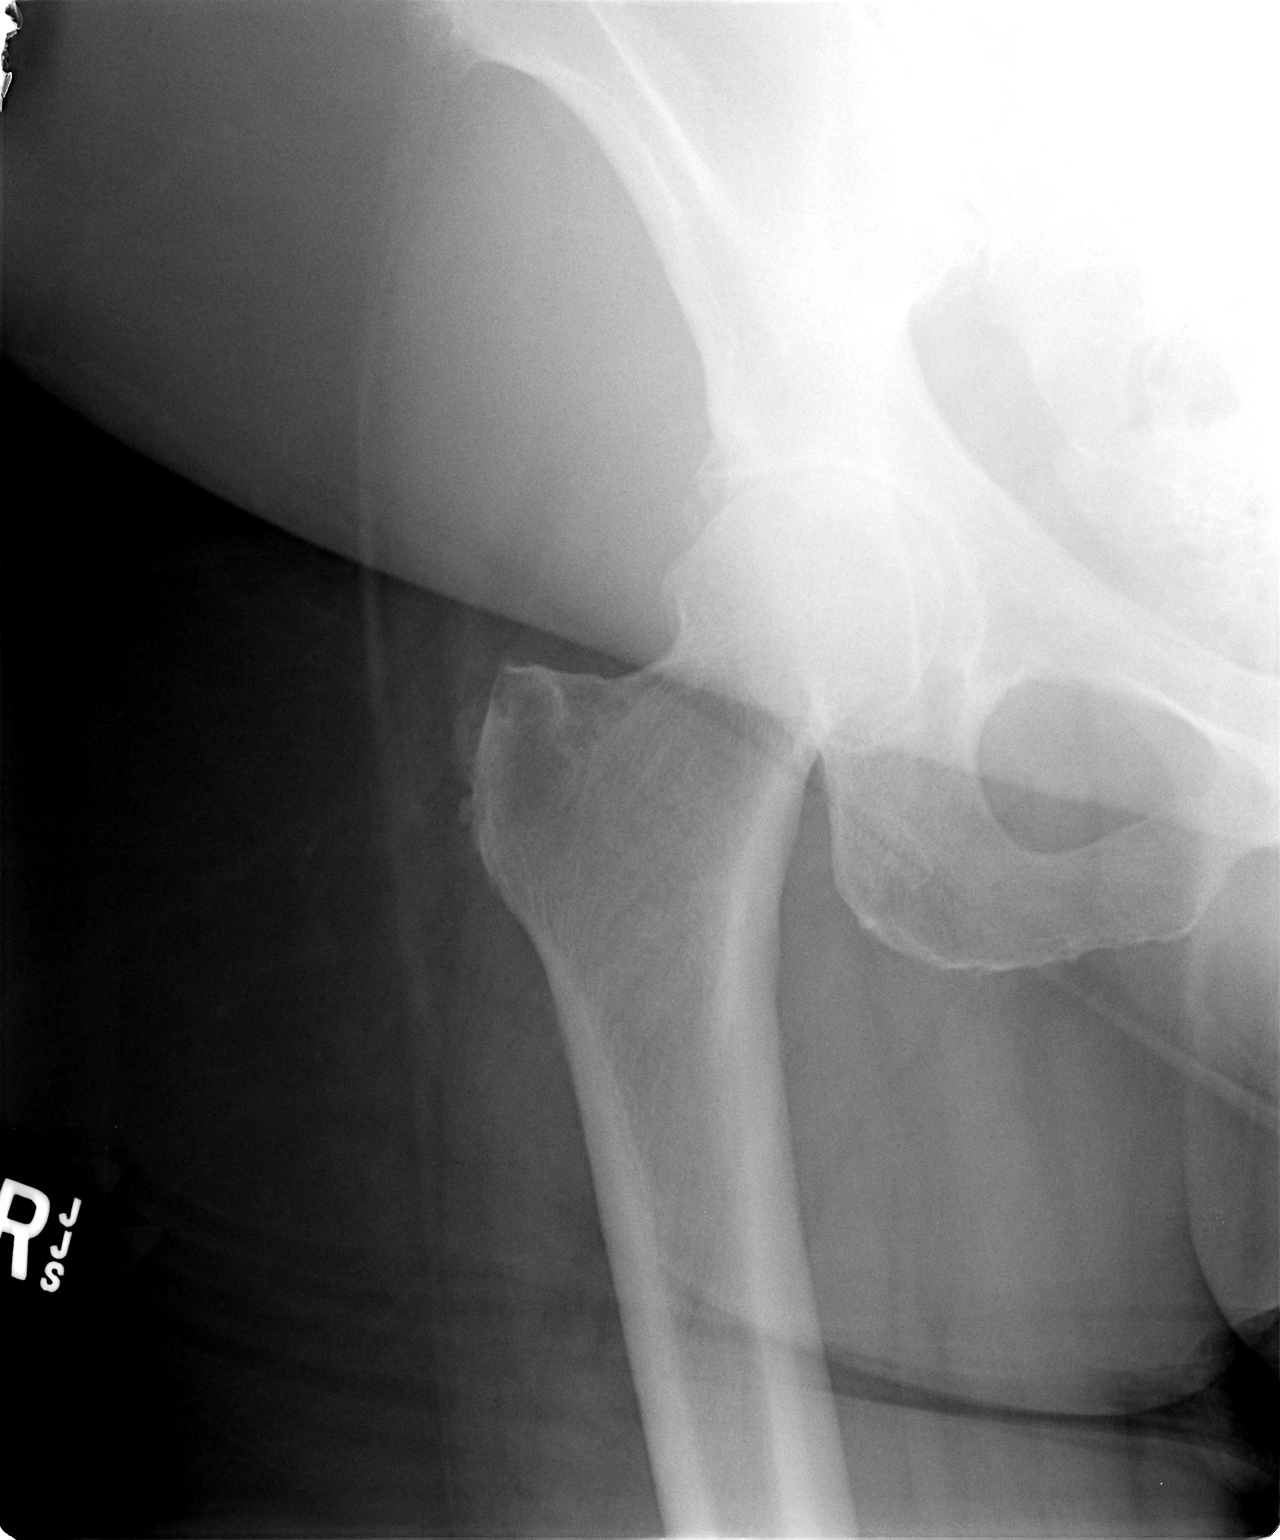

[view not recorded (3 of 3)]
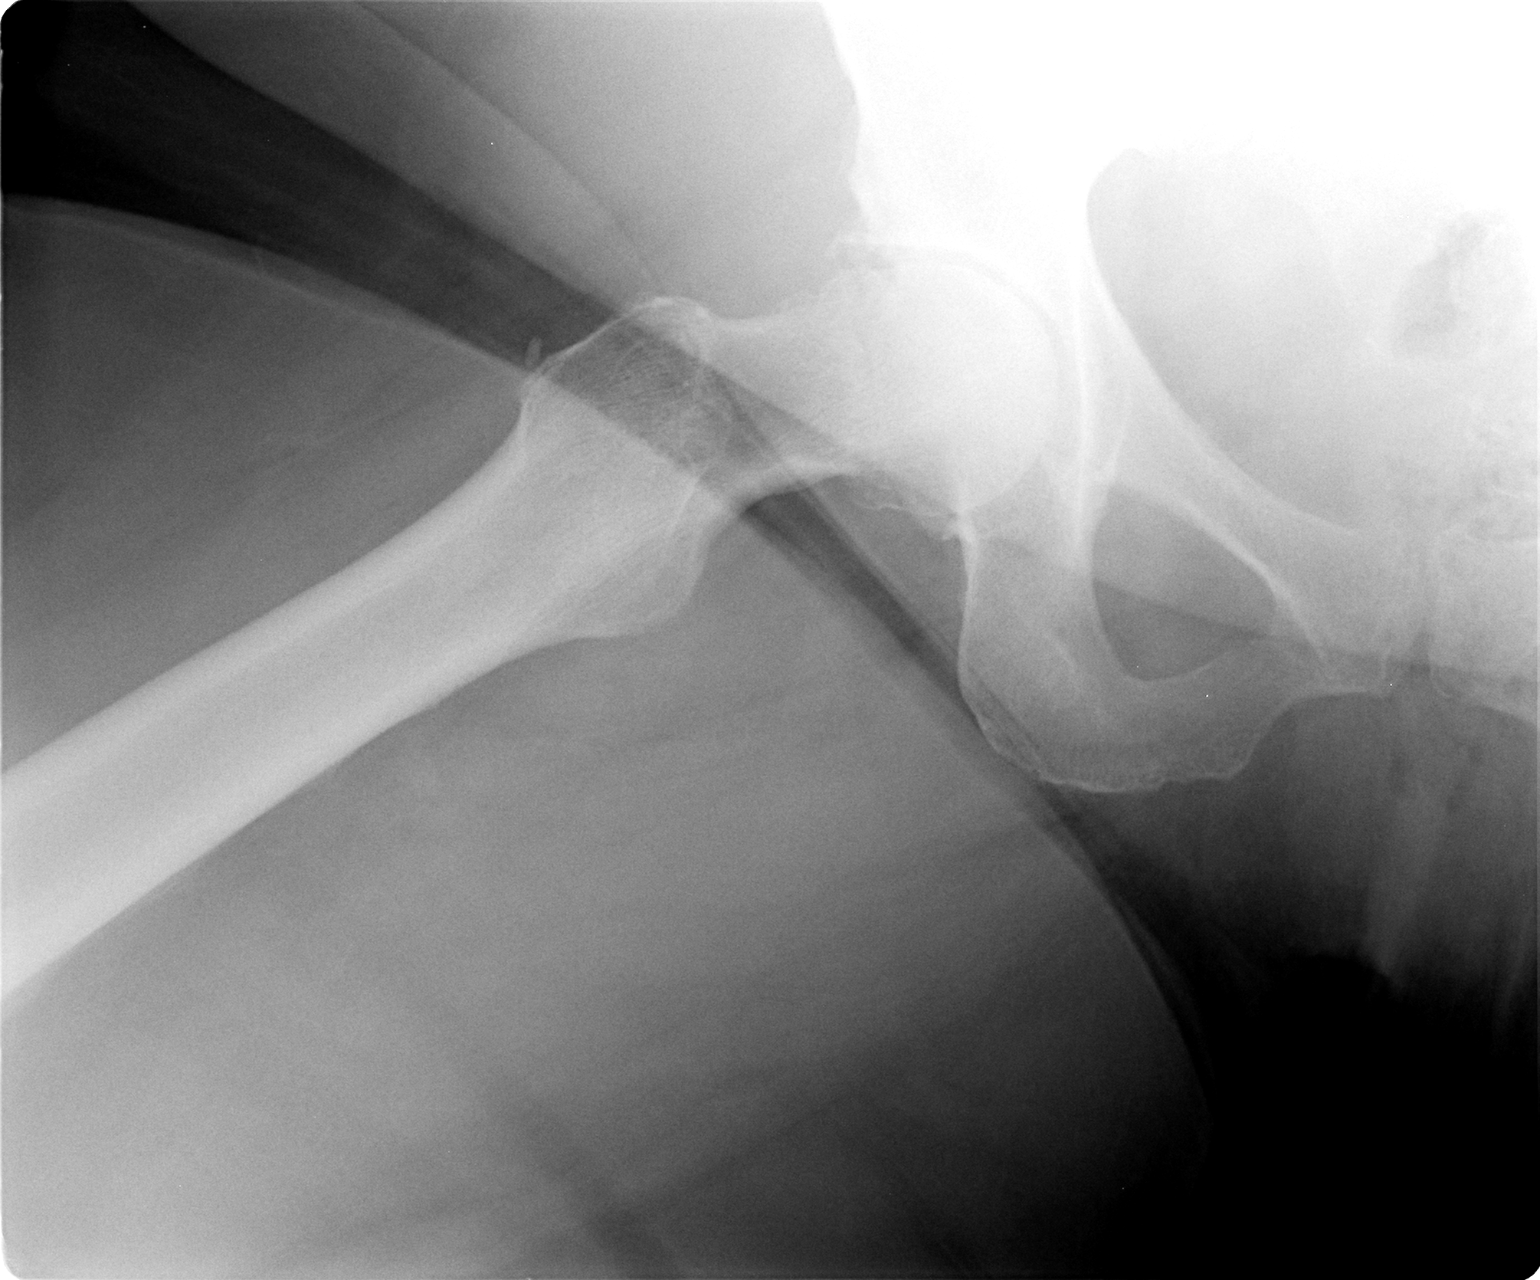

[3 of 3 positions shown; findings below may reference images not displayed]

FINDINGS: There is moderate degenerative joint disease involving
the right hip with some loss of joint space and spurring.  Lesser
degenerative joint disease involving the left hip.  The pelvic rami
are intact.  The SI joints appear normal.
IMPRESSION: Moderate degenerative joint disease of the right hip.

## 2010-10-10 IMAGING — CR DG LUMBAR SPINE 2-3V
3 series · 3 of 3 positions shown · non-contrast
Comparison: Abdomen film of [DATE]

CLINICAL DATA: Right hip and right leg pain, no injury

LUMBAR SPINE - 2-3 VIEW

[view not recorded (1 of 3)]
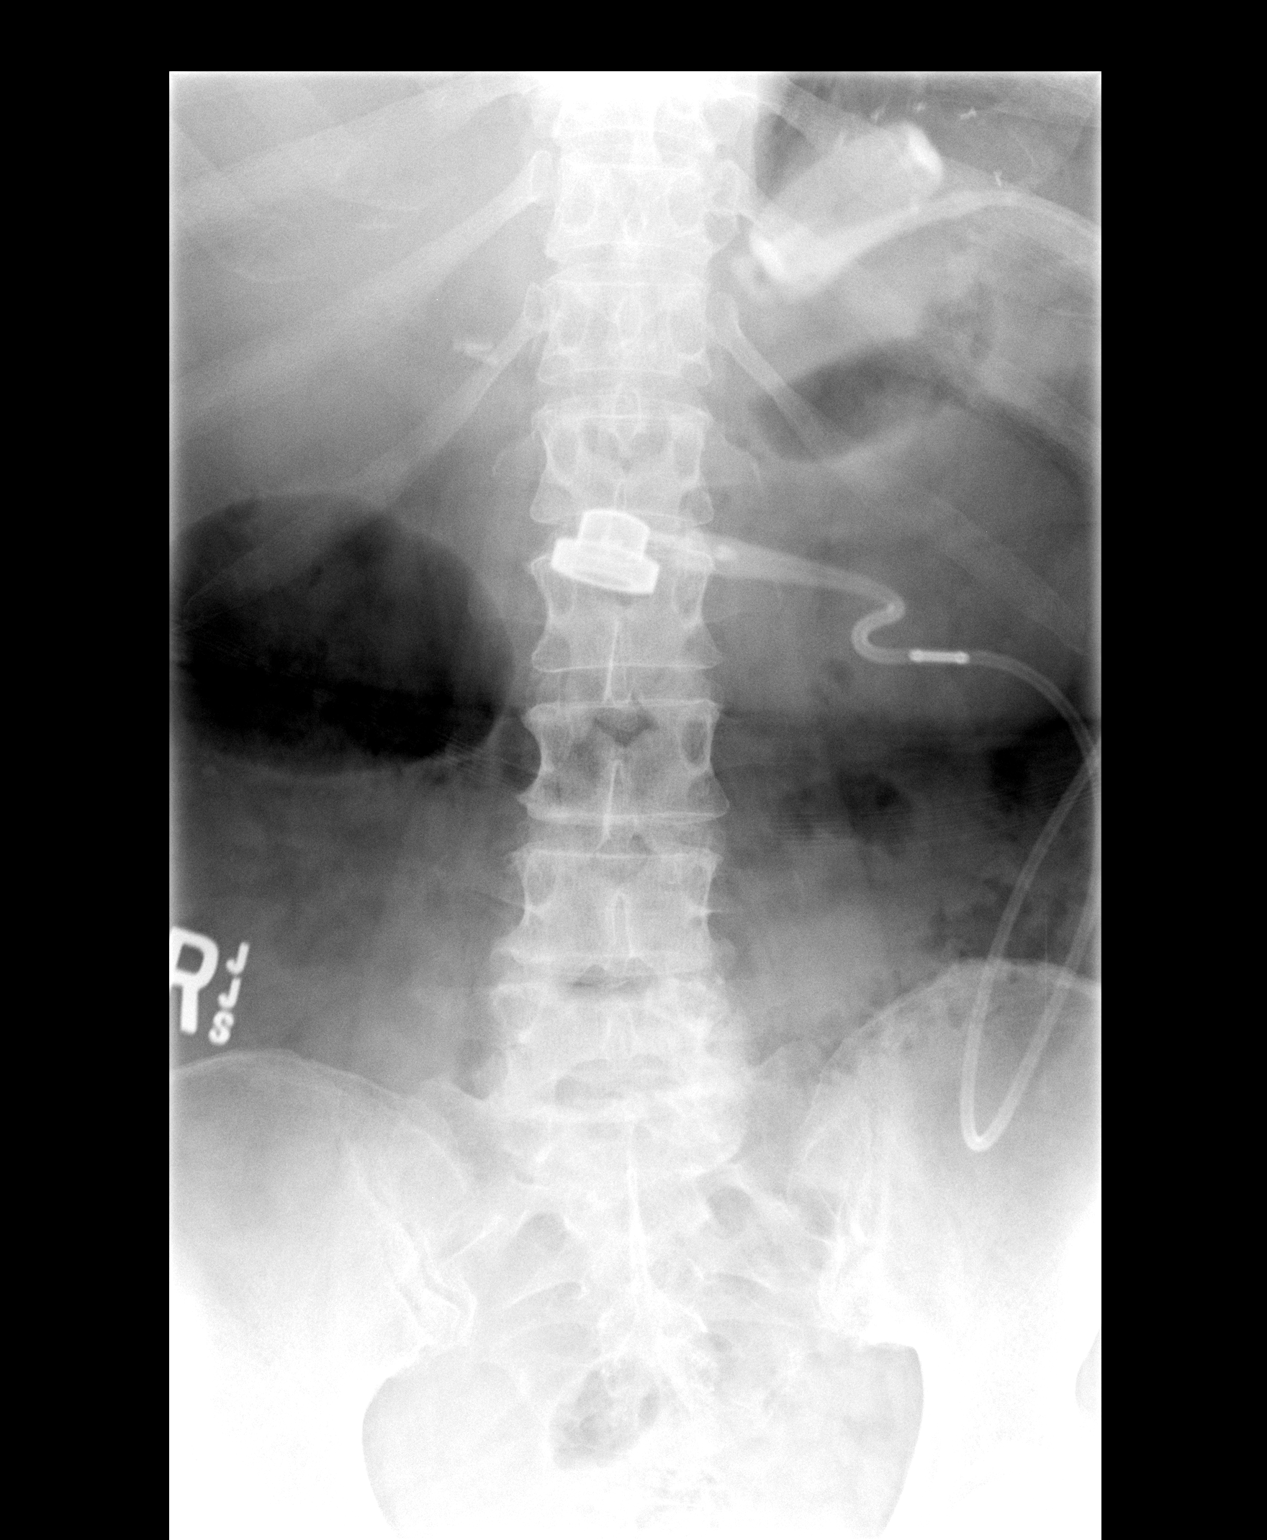

[view not recorded (2 of 3)]
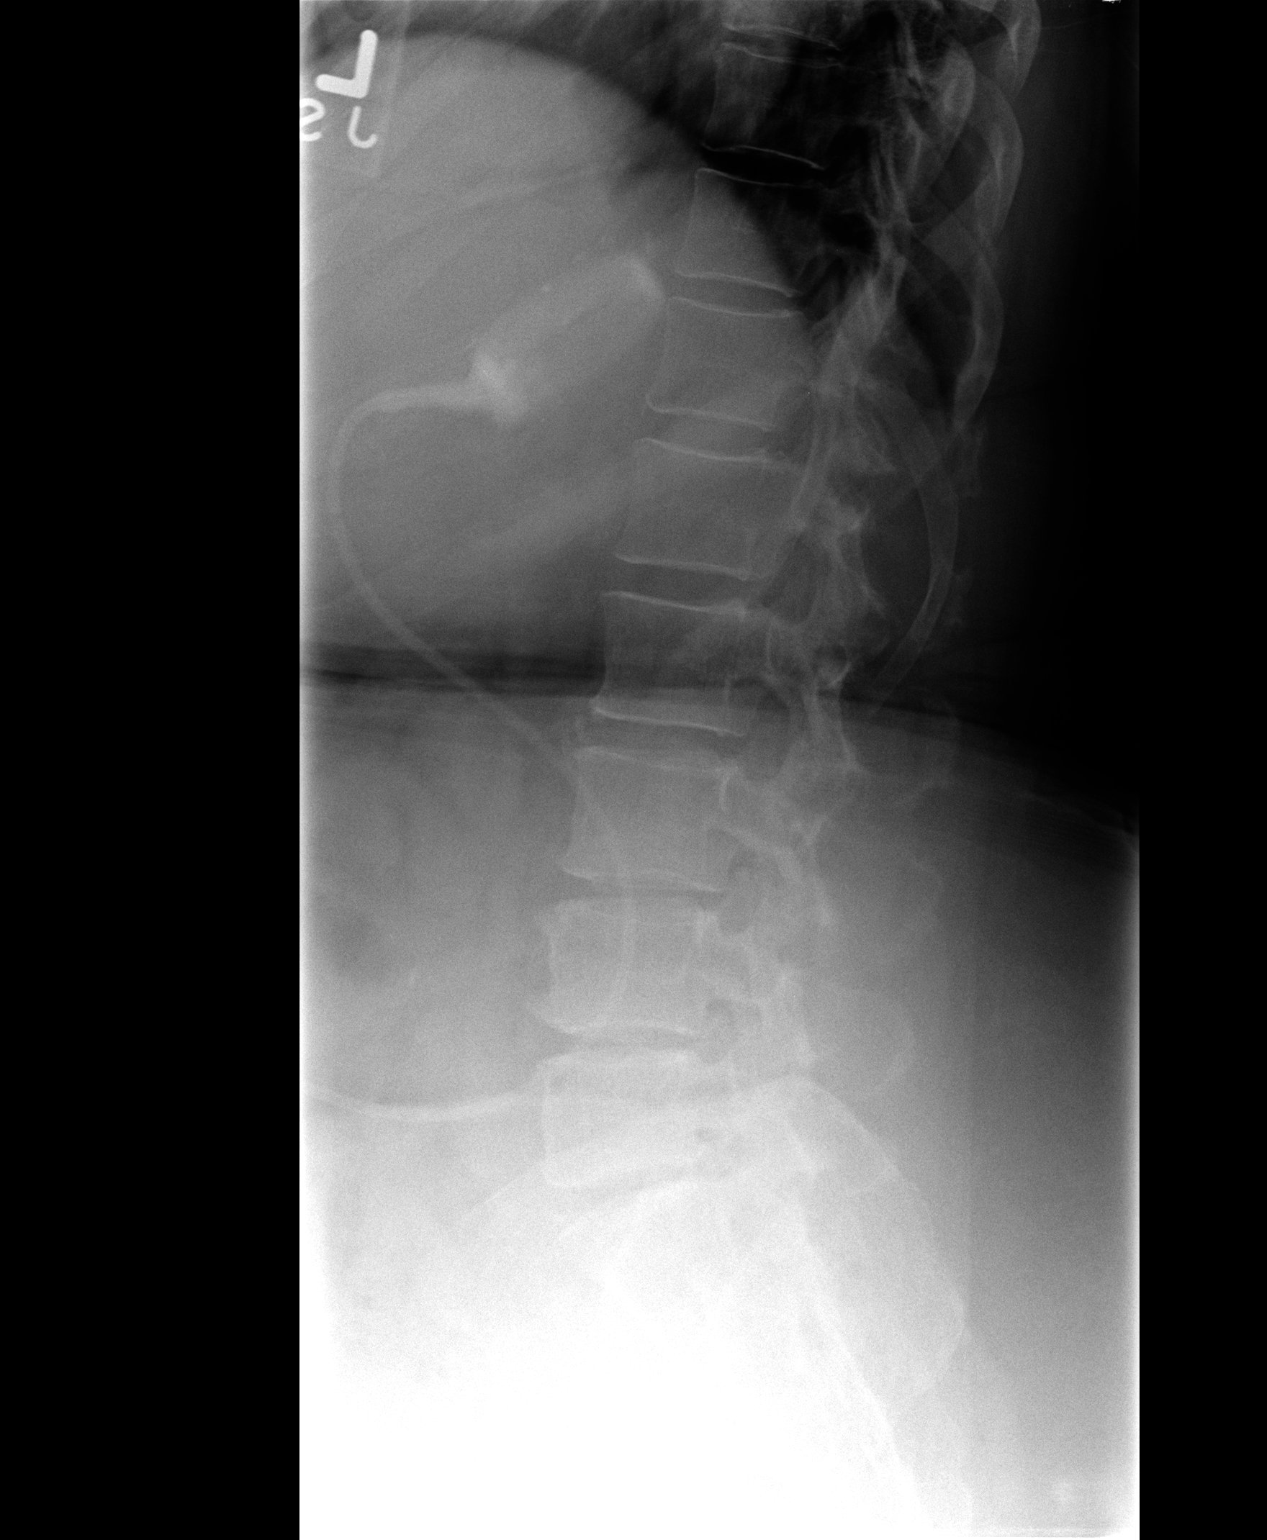

[view not recorded (3 of 3)]
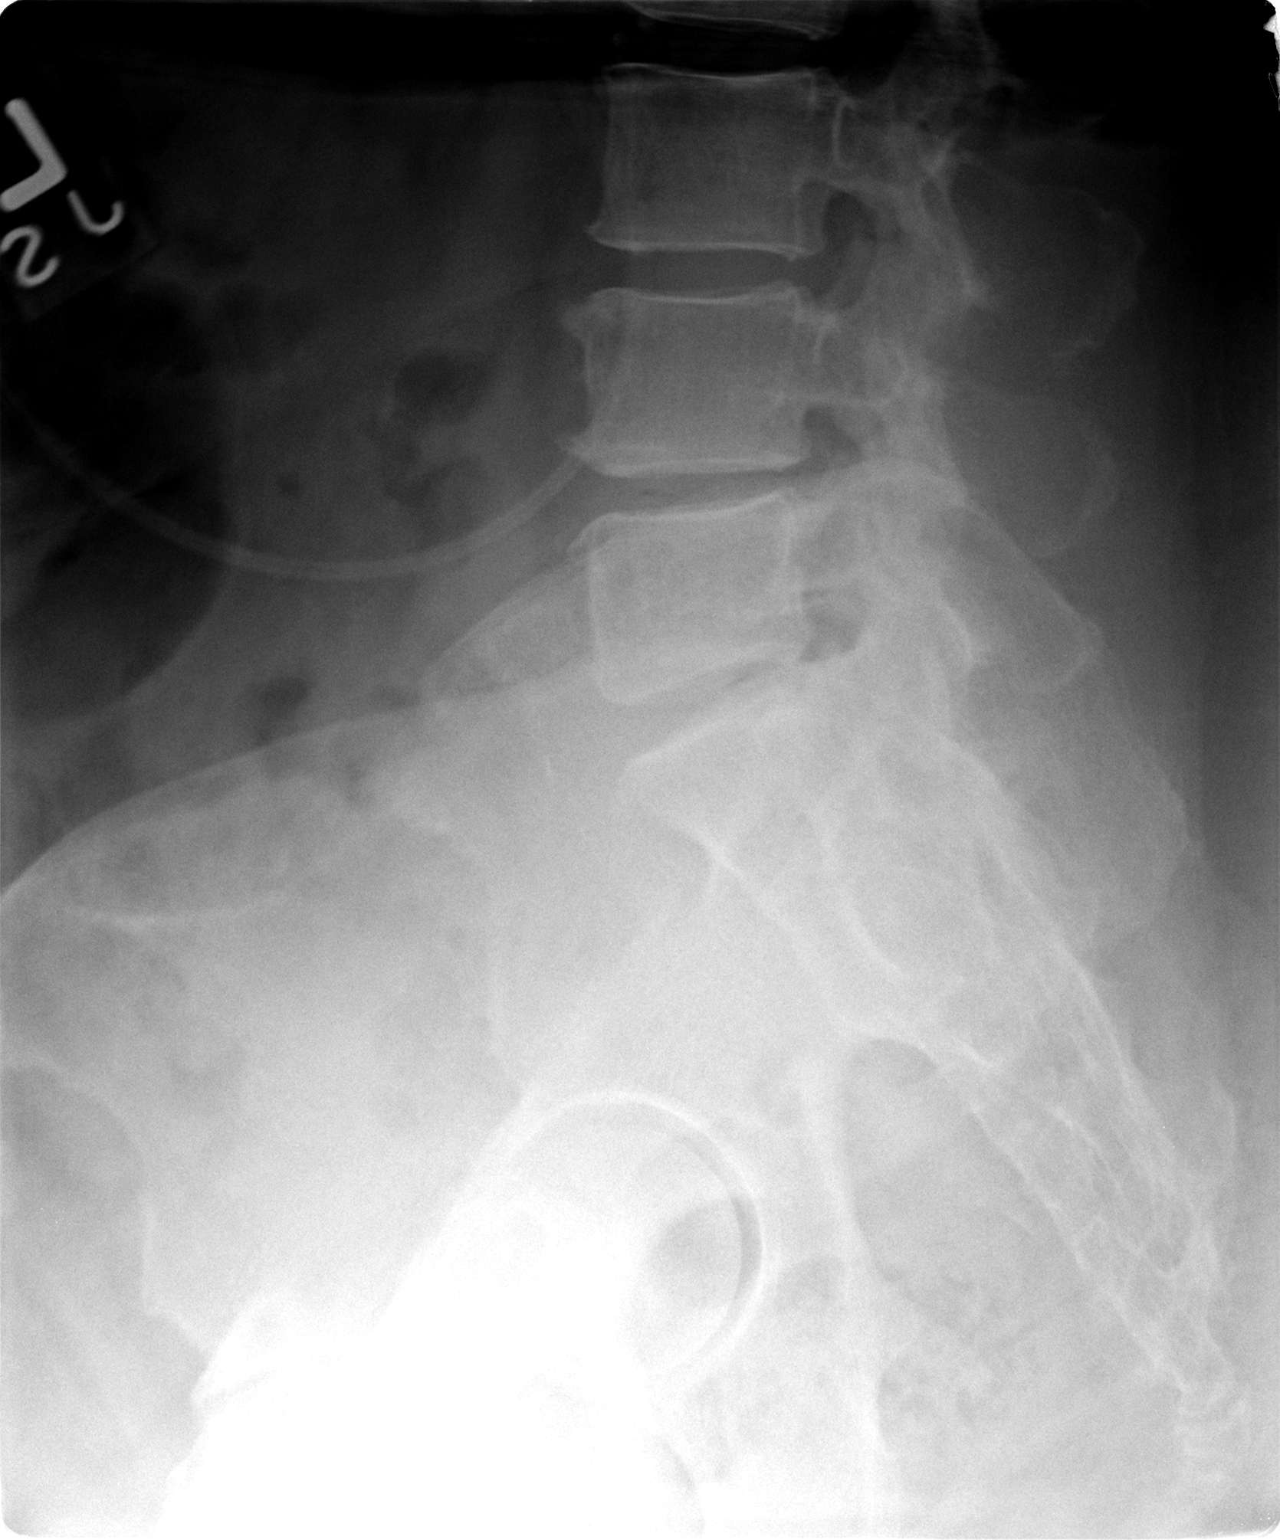

[3 of 3 positions shown; findings below may reference images not displayed]

FINDINGS: The lumbar vertebrae are in normal alignment.  There is
degenerative disc disease particularly at L4-5 and L5 -S1 with loss
of disc space, sclerosis, and spurring.  No acute compression
fracture is seen.  The SI joints appear normal.  A lap band is
present.
IMPRESSION: Degenerative disc disease at L4-5 and L5-S1.

## 2010-10-10 MED ORDER — TRAMADOL HCL 50 MG PO TABS: 50.0000 mg | ORAL_TABLET | Freq: Four times a day (QID) | ORAL | Status: DC | PRN

## 2010-10-10 MED ORDER — SERTRALINE HCL 50 MG PO TABS: 50.0000 mg | ORAL_TABLET | Freq: Every day | ORAL | Status: DC

## 2010-10-10 NOTE — Patient Instructions (Signed)
Back Pain & Injury Your back pain is most likely caused by a strain of the muscles or ligaments supporting the spine. Back strains cause pain and trouble moving because of muscle spasms. They may take several weeks to heal. Usually they are better in days.  Treatment for back pain includes:  Rest - Get bed rest as needed over the next day or two. Use a firm mattress and lie on your side with your knees slightly bent. If you lie on your back, put a pillow under your knees.   Early movement - Back pain improves most rapidly if you remain active. It is much more stressful on the back to sit or stand in one place. Do not sit, drive or stand in one place for more than 30 minutes at a time. Take short walks on level surfaces as soon as pain allows.   Limit bending and lifting - Do not bend over or lift anything over 20 pounds until instructed otherwise. Lift by bending your knees. Use your leg muscles to help. Keep the load close to your body and avoid twisting. Do not reach or do overhead work.   Medicines - Medicine to reduce pain and inflammation are helpful. Muscle-relaxing drugs may be prescribed.   Therapy - Put ice packs on your back every few hours for the first 2-3 days after your injury or as instructed. After that ice or heat may be alternated to reduce pain and spasm. Back exercises and gentle massage may be of some benefit. You should be examined again if your back pain is not better in one week.  SEEK IMMEDIATE MEDICAL CARE IF:  You have pain that radiates from your back into your legs.   You develop new bowel or bladder control problems.   You have unusual weakness or numbness in your arms or legs.   You develop nausea or vomiting.   You develop abdominal pain.   You feel faint.  Document Released: 07/02/2005 Document Re-Released: 04/10/2008 [facility] Patient Information 2011 [place].Depression, Adolescent and Adult Depression is a true and treatable medical condition. In  general there are two kinds of depression:  Depression we all experience in some form. For example depression from the death of a loved one, financial distress or natural disasters will trigger or increase depression.   Clinical depression, on the other hand, appears without an apparent cause or reason. This depression is a disease. Depression may be caused by chemical imbalance in the body and brain or may come as a response to a physical illness. Alcohol and other drugs can cause depression.  DIAGNOSIS  The diagnosis of depression is usually based upon symptoms and medical history. TREATMENT Treatments for depression fall into three categories. These are:  Drug therapy. There are many medicines that treat depression. Responses may vary and sometimes trial and error is necessary to determine the best medicines and dosage for a particular patient.   Psychotherapy, also called talking treatments, helps people resolve their problems by looking at them from a different point of view and by giving people insight into their own personal makeup. Traditional psychotherapy looks at a childhood source of a problem. Other psychotherapy will look at current conflicts and move toward solving those. If the cause of depression is drug use, counseling is available to help abstain. In time the depression will usually improve. If there were underlying causes for the chemical use, they can be addressed.   ECT (electroconvulsive therapy) or shock treatment is not as  commonly used today. It is a very effective treatment for severe suicidal depression. During ECT electrical impulses are applied to the head. These impulses cause a generalized seizure. It can be effective but causes a loss of memory for recent events. Sometimes this loss of memory may include the last several months.  Treat all depression or suicide threats as serious. Obtain professional help. Do not wait to see if serious depression will get better over  time without help. Seek help for yourself or those around you. In the [place] the number to the [place] With 24 Hour Help Are: 1-800-SUICIDE 415-485-9793 Document Released: 06/29/2000 Document Re-Released: 09/28/2008 [facility] Patient Information 2011 [place].

## 2010-10-10 NOTE — Progress Notes (Signed)
  Subjective:    Patient ID: [name], female    DOB: 1945-07-20, 65 y.o.   MRN: [id number]  Back Pain This is a recurrent problem. The current episode started more than 1 year ago. The problem occurs intermittently. The problem has been gradually worsening since onset. The pain is present in the lumbar spine. The quality of the pain is described as aching. The pain radiates to the right thigh, right foot, right knee, left knee, left thigh and left foot. The pain is at a severity of 8/10. The pain is moderate. The pain is worse during the day (Pt states it can occur any time--comes and goes). The symptoms are aggravated by position and standing (pt sits to get relief). Associated symptoms include leg pain, numbness and weakness (no change from stroke). Pertinent negatives include no abdominal pain, bladder incontinence, bowel incontinence, chest pain, dysuria, headaches, paresis, paresthesias, pelvic pain, perianal numbness, tingling or weight loss. Risk factors include obesity. She has tried walking, heat and bed rest for the symptoms. The treatment provided moderate relief.   Pt is also seeing a psychologist ---  Kathyrn Lass--- who believes pt is depressed and pt agrees.  Pt was on med one time but she did not take it regularly.    Review of Systems  Constitutional: Negative for weight loss.  Cardiovascular: Negative for chest pain.  Gastrointestinal: Negative for abdominal pain and bowel incontinence.  Genitourinary: Negative for bladder incontinence, dysuria and pelvic pain.  Musculoskeletal: Positive for back pain.  Neurological: Positive for weakness (no change from stroke) and numbness. Negative for tingling, headaches and paresthesias.  Psychiatric/Behavioral: Positive for dysphoric mood. Negative for behavioral problems, confusion, decreased concentration and agitation.       Objective:   Physical Exam  Constitutional: She is oriented to person, place, and time. She appears  well-developed and well-nourished.  Musculoskeletal:       Left hip: She exhibits tenderness. She exhibits normal range of motion, no bony tenderness, no swelling, no crepitus and no deformity.       Legs: Neurological: She is alert and oriented to person, place, and time. She has normal reflexes. She is not disoriented.  Psychiatric: Her speech is normal and behavior is normal. Judgment and thought content normal. Her mood appears not anxious. Her affect is not angry, not blunt, not labile and not inappropriate. Cognition and memory are not impaired. She exhibits a depressed mood.       Pt denies suicidal ideation          Assessment & Plan:

## 2010-10-11 NOTE — Progress Notes (Signed)
Left message to call back with husband      KP

## 2010-10-18 LAB — DIFFERENTIAL
Basophils Absolute: 0 10*3/uL (ref 0.0–0.1)
Basophils Relative: 1 % (ref 0–1)
Eosinophils Absolute: 0.1 10*3/uL (ref 0.0–0.7)
Eosinophils Relative: 1 % (ref 0–5)
Lymphocytes Relative: 16 % (ref 12–46)
Lymphs Abs: 1.6 10*3/uL (ref 0.7–4.0)
Monocytes Absolute: 1.2 10*3/uL — ABNORMAL HIGH (ref 0.1–1.0)
Monocytes Relative: 13 % — ABNORMAL HIGH (ref 3–12)
Neutro Abs: 6.9 10*3/uL (ref 1.7–7.7)
Neutrophils Relative %: 70 % (ref 43–77)

## 2010-10-18 LAB — CBC
HCT: 36.2 % (ref 36.0–46.0)
Hemoglobin: 12.1 g/dL (ref 12.0–15.0)
MCHC: 33.3 g/dL (ref 30.0–36.0)
MCV: 85.7 fL (ref 78.0–100.0)
Platelets: 223 10*3/uL (ref 150–400)
RBC: 4.22 MIL/uL (ref 3.87–5.11)
RDW: 17.1 % — ABNORMAL HIGH (ref 11.5–15.5)
WBC: 9.9 10*3/uL (ref 4.0–10.5)

## 2010-10-19 LAB — DIFFERENTIAL
Basophils Absolute: 0.1 10*3/uL (ref 0.0–0.1)
Basophils Relative: 1 % (ref 0–1)
Eosinophils Absolute: 0.2 10*3/uL (ref 0.0–0.7)
Eosinophils Relative: 2 % (ref 0–5)
Lymphocytes Relative: 20 % (ref 12–46)
Lymphs Abs: 1.9 10*3/uL (ref 0.7–4.0)
Monocytes Absolute: 1 10*3/uL (ref 0.1–1.0)
Monocytes Relative: 10 % (ref 3–12)
Neutro Abs: 6.5 10*3/uL (ref 1.7–7.7)
Neutrophils Relative %: 67 % (ref 43–77)

## 2010-10-19 LAB — COMPREHENSIVE METABOLIC PANEL
ALT: 37 U/L — ABNORMAL HIGH (ref 0–35)
AST: 37 U/L (ref 0–37)
Albumin: 3.4 g/dL — ABNORMAL LOW (ref 3.5–5.2)
Alkaline Phosphatase: 81 U/L (ref 39–117)
BUN: 29 mg/dL — ABNORMAL HIGH (ref 6–23)
CO2: 30 mEq/L (ref 19–32)
Calcium: 9.2 mg/dL (ref 8.4–10.5)
Chloride: 100 mEq/L (ref 96–112)
Creatinine, Ser: 1.87 mg/dL — ABNORMAL HIGH (ref 0.4–1.2)
GFR calc Af Amer: 33 mL/min — ABNORMAL LOW (ref >60)
GFR calc non Af Amer: 27 mL/min — ABNORMAL LOW (ref >60)
Glucose, Bld: 91 mg/dL (ref 70–99)
Potassium: 3.6 mEq/L (ref 3.5–5.1)
Sodium: 139 mEq/L (ref 135–145)
Total Bilirubin: 0.8 mg/dL (ref 0.3–1.2)
Total Protein: 7.2 g/dL (ref 6.0–8.3)

## 2010-10-19 LAB — CBC
HCT: 38.6 % (ref 36.0–46.0)
Hemoglobin: 12.7 g/dL (ref 12.0–15.0)
MCHC: 32.9 g/dL (ref 30.0–36.0)
MCV: 85.8 fL (ref 78.0–100.0)
Platelets: 249 10*3/uL (ref 150–400)
RBC: 4.5 MIL/uL (ref 3.87–5.11)
RDW: 17 % — ABNORMAL HIGH (ref 11.5–15.5)
WBC: 9.7 10*3/uL (ref 4.0–10.5)

## 2010-10-24 LAB — CBC
HCT: 34 % — ABNORMAL LOW (ref 36.0–46.0)
Hemoglobin: 11.5 g/dL — ABNORMAL LOW (ref 12.0–15.0)
MCHC: 33.7 g/dL (ref 30.0–36.0)
MCV: 81.1 fL (ref 78.0–100.0)
Platelets: 311 10*3/uL (ref 150–400)
RBC: 4.19 MIL/uL (ref 3.87–5.11)
RDW: 15.9 % — ABNORMAL HIGH (ref 11.5–15.5)
WBC: 10.4 10*3/uL (ref 4.0–10.5)

## 2010-10-24 LAB — COMPREHENSIVE METABOLIC PANEL
ALT: 20 U/L (ref 0–35)
AST: 15 U/L (ref 0–37)
Albumin: 3 g/dL — ABNORMAL LOW (ref 3.5–5.2)
Alkaline Phosphatase: 77 U/L (ref 39–117)
BUN: 55 mg/dL — ABNORMAL HIGH (ref 6–23)
CO2: 27 mEq/L (ref 19–32)
Calcium: 9 mg/dL (ref 8.4–10.5)
Chloride: 107 mEq/L (ref 96–112)
Creatinine, Ser: 3.28 mg/dL — ABNORMAL HIGH (ref 0.4–1.2)
GFR calc Af Amer: 17 mL/min — ABNORMAL LOW (ref >60)
GFR calc non Af Amer: 14 mL/min — ABNORMAL LOW (ref >60)
Glucose, Bld: 106 mg/dL — ABNORMAL HIGH (ref 70–99)
Potassium: 4 mEq/L (ref 3.5–5.1)
Sodium: 140 mEq/L (ref 135–145)
Total Bilirubin: 0.4 mg/dL (ref 0.3–1.2)
Total Protein: 7 g/dL (ref 6.0–8.3)

## 2010-10-24 LAB — BASIC METABOLIC PANEL
BUN: 37 mg/dL — ABNORMAL HIGH (ref 6–23)
BUN: 58 mg/dL — ABNORMAL HIGH (ref 6–23)
CO2: 27 mEq/L (ref 19–32)
CO2: 28 mEq/L (ref 19–32)
Calcium: 8.9 mg/dL (ref 8.4–10.5)
Calcium: 9.3 mg/dL (ref 8.4–10.5)
Chloride: 103 mEq/L (ref 96–112)
Chloride: 107 mEq/L (ref 96–112)
Creatinine, Ser: 2.37 mg/dL — ABNORMAL HIGH (ref 0.4–1.2)
Creatinine, Ser: 3.87 mg/dL — ABNORMAL HIGH (ref 0.4–1.2)
GFR calc Af Amer: 14 mL/min — ABNORMAL LOW (ref >60)
GFR calc Af Amer: 25 mL/min — ABNORMAL LOW (ref >60)
GFR calc non Af Amer: 12 mL/min — ABNORMAL LOW (ref >60)
GFR calc non Af Amer: 21 mL/min — ABNORMAL LOW (ref >60)
Glucose, Bld: 138 mg/dL — ABNORMAL HIGH (ref 70–99)
Glucose, Bld: 96 mg/dL (ref 70–99)
Potassium: 4.3 mEq/L (ref 3.5–5.1)
Potassium: 5.1 mEq/L (ref 3.5–5.1)
Sodium: 138 mEq/L (ref 135–145)
Sodium: 141 mEq/L (ref 135–145)

## 2010-10-24 LAB — LIPID PANEL
Cholesterol: 184 mg/dL (ref 0–200)
HDL: 33 mg/dL — ABNORMAL LOW (ref >39)
LDL Cholesterol: 130 mg/dL — ABNORMAL HIGH (ref 0–99)
Total CHOL/HDL Ratio: 5.6 RATIO
Triglycerides: 104 mg/dL (ref <150)
VLDL: 21 mg/dL (ref 0–40)

## 2010-10-24 LAB — TSH: TSH: 2.54 u[IU]/mL (ref 0.350–4.500)

## 2010-10-24 LAB — APTT: aPTT: 29 seconds (ref 24–37)

## 2010-10-24 LAB — HEMOGLOBIN A1C
Hgb A1c MFr Bld: 5.5 % (ref 4.6–6.1)
Mean Plasma Glucose: 111 mg/dL

## 2010-10-24 LAB — PROTIME-INR
INR: 1.1 (ref 0.00–1.49)
Prothrombin Time: 14 seconds (ref 11.6–15.2)

## 2010-10-26 LAB — CARDIAC PANEL(CRET KIN+CKTOT+MB+TROPI)
CK, MB: 1 ng/mL (ref 0.3–4.0)
CK, MB: 1.2 ng/mL (ref 0.3–4.0)
CK, MB: 1.2 ng/mL (ref 0.3–4.0)
Relative Index: 0.7 (ref 0.0–2.5)
Relative Index: 0.7 (ref 0.0–2.5)
Relative Index: 0.8 (ref 0.0–2.5)
Total CK: 131 U/L (ref 7–177)
Total CK: 164 U/L (ref 7–177)
Total CK: 177 U/L (ref 7–177)
Troponin I: 0.01 ng/mL (ref 0.00–0.06)
Troponin I: <0.01 ng/mL (ref 0.00–0.06)
Troponin I: <0.01 ng/mL (ref 0.00–0.06)

## 2010-10-26 LAB — PROTIME-INR
INR: 1.3 (ref 0.00–1.49)
INR: 2.1 — ABNORMAL HIGH (ref 0.00–1.49)
Prothrombin Time: 16.2 seconds — ABNORMAL HIGH (ref 11.6–15.2)
Prothrombin Time: 24.7 seconds — ABNORMAL HIGH (ref 11.6–15.2)

## 2010-10-26 LAB — PLATELET COUNT: Platelets: 274 10*3/uL (ref 150–400)

## 2010-10-26 LAB — DIFFERENTIAL
Basophils Absolute: 0 10*3/uL (ref 0.0–0.1)
Basophils Relative: 0 % (ref 0–1)
Eosinophils Absolute: 0.2 10*3/uL (ref 0.0–0.7)
Eosinophils Relative: 2 % (ref 0–5)
Lymphocytes Relative: 19 % (ref 12–46)
Lymphs Abs: 2.3 10*3/uL (ref 0.7–4.0)
Monocytes Absolute: 0.7 10*3/uL (ref 0.1–1.0)
Monocytes Relative: 6 % (ref 3–12)
Neutro Abs: 8.8 10*3/uL — ABNORMAL HIGH (ref 1.7–7.7)
Neutrophils Relative %: 73 % (ref 43–77)

## 2010-10-26 LAB — APTT
aPTT: 34 seconds (ref 24–37)
aPTT: 38 seconds — ABNORMAL HIGH (ref 24–37)

## 2010-10-26 LAB — PREPARE FRESH FROZEN PLASMA

## 2010-10-26 LAB — BASIC METABOLIC PANEL
BUN: 25 mg/dL — ABNORMAL HIGH (ref 6–23)
CO2: 27 mEq/L (ref 19–32)
Calcium: 8.5 mg/dL (ref 8.4–10.5)
Chloride: 103 mEq/L (ref 96–112)
Creatinine, Ser: 1.45 mg/dL — ABNORMAL HIGH (ref 0.4–1.2)
GFR calc Af Amer: 45 mL/min — ABNORMAL LOW (ref >60)
GFR calc non Af Amer: 37 mL/min — ABNORMAL LOW (ref >60)
Glucose, Bld: 110 mg/dL — ABNORMAL HIGH (ref 70–99)
Potassium: 4.6 mEq/L (ref 3.5–5.1)
Sodium: 139 mEq/L (ref 135–145)

## 2010-10-26 LAB — CROSSMATCH
ABO/RH(D): O POS
Antibody Screen: NEGATIVE

## 2010-10-26 LAB — CBC
HCT: 39.4 % (ref 36.0–46.0)
Hemoglobin: 13.1 g/dL (ref 12.0–15.0)
MCHC: 33.2 g/dL (ref 30.0–36.0)
MCV: 81.3 fL (ref 78.0–100.0)
Platelets: 304 10*3/uL (ref 150–400)
RBC: 4.85 MIL/uL (ref 3.87–5.11)
RDW: 16.1 % — ABNORMAL HIGH (ref 11.5–15.5)
WBC: 12.1 10*3/uL — ABNORMAL HIGH (ref 4.0–10.5)

## 2010-10-26 LAB — ABO/RH: ABO/RH(D): O POS

## 2010-10-30 ENCOUNTER — Encounter: Payer: Self-pay | Admitting: Family Medicine

## 2010-11-06 ENCOUNTER — Encounter: Payer: Self-pay | Admitting: Family Medicine

## 2010-11-08 ENCOUNTER — Ambulatory Visit (INDEPENDENT_AMBULATORY_CARE_PROVIDER_SITE_OTHER): Payer: 59 | Admitting: Family Medicine

## 2010-11-08 ENCOUNTER — Encounter: Payer: Self-pay | Admitting: Family Medicine

## 2010-11-08 DIAGNOSIS — F32A Depression, unspecified: Secondary | ICD-10-CM

## 2010-11-08 DIAGNOSIS — F329 Major depressive disorder, single episode, unspecified: Secondary | ICD-10-CM

## 2010-11-08 NOTE — Progress Notes (Signed)
  Subjective:    Patient ID: [name], female    DOB: 12-Aug-1945, 65 y.o.   MRN: [id number]  HPI Pt here f/u depression.  She is doing well.  No complaints.    Review of Systems As above    Objective:   Physical Exam  Constitutional: She appears well-developed and well-nourished.  Psychiatric: Her speech is normal and behavior is normal. Judgment and thought content normal. Her mood appears not anxious. Her affect is not angry, not blunt, not labile and not inappropriate. Cognition and memory are normal. She does not exhibit a depressed mood.          Assessment & Plan:

## 2010-11-08 NOTE — Assessment & Plan Note (Signed)
con't zoloft rto 6 months or sooner prn

## 2010-11-08 NOTE — Patient Instructions (Signed)

## 2010-11-28 NOTE — Discharge Summary (Signed)
[name], [name]                 ACCOUNT NO.:  [id number]   MEDICAL RECORD NO.:  [id number]          PATIENT TYPE:  INP   LOCATION:  3112                         FACILITY:  [facility]   PHYSICIAN:  Jannifer Rodney. Asa Lente, MDDATE OF BIRTH:  01/25/1946   DATE OF ADMISSION:  09/21/2008  DATE OF DISCHARGE:  09/23/2008                               DISCHARGE SUMMARY   DISCHARGE DIAGNOSES:  1. 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  2. Left-sided weakness due to right thalamic hemorrhage,      symptomatically improved, status post physical therapy/occupational      therapy evaluation with a recommendation for outpatient therapy.  3. Hypertension, uncontrolled and exacerbated by intracranial      hemorrhage with right thalamic hemorrhagic stroke, now improved,      continue tight outpatient medical management with oral medications      and encouraged compliance.  4. Chronic atrial fibrillation, previously on anticoagulation but now      discontinued forever due to intracranial hemorrhage with right      thalamic hemorrhagic stroke and resume aspirin therapy 81 mg in 2      weeks with outpatient followup with cardiologist Dr. Radford Pax as      above.  Dr. Radford Pax is aware of this change per discussion with Dr.      Leonie Man.  5. Morbid obesity.  6. Obstructive sleep apnea, continue CPAP as prior to admission.  7. History of depression and anxiety.   DISCHARGE MEDICATIONS:  1. Discontinuation of Coumadin forever.  2. Aspirin 81 mg once daily, to begin in 2 weeks on October 07, 2008.   Other medications are as prior to admission and include:  1. Lasix 80 mg p.o. b.i.d.  2. Micardis 80 mg p.o. daily.  3. Lisinopril 40 mg 2 tablets daily.  4. Hectorol 0.5 mcg 2 tablets daily.  5. Metoprolol XL 100 mg daily.  6. Sotalol 80 mg twice daily.  7. Diltiazem CD 360 mg daily.  8. Potassium 20 mEq p.o.  t.i.d.   CONDITION ON DISCHARGE:  Medically stable with minimal left lower  extremity weakness, no aphasia, no dysarthria, no facial asymmetry,  decreased left grip strength in the left upper extremity.   Hospital followup is scheduled with primary care physician, Dr. Garnet Koyanagi, to call for an appointment in 2 weeks for recheck of blood  pressure, overall medical management, and continued efforts at ongoing  weight loss, also with Dr. Leonie Man of [facility], to call for an  appointment in 2 months.   CONSULTATIONS:  This hospitalization include stroke service, Princess Bruins.  Gaynell Face, M.D. and Pramod P. Leonie Man, [place]:  1. Hemorrhagic stroke:  The patient is a pleasant 65 year old woman      with moderate obesity and history of hypertension, as well as      chronic atrial fibrillation on Coumadin prior to admission.  She      came to her primary care  physician's office complaining of seeming      weakness and discoordination of the left side of her body since      onset earlier that morning.  She felt short of breath and due to      concern for probable stroke, she was sent to [facility]      for further admission and evaluation.  A head CT performed that      night did show primary hemorrhage in the right thalamus, consistent      with small vessel disease, stroke, and hemorrhage.  Her INR was      therapeutic but reversed due to acute hemorrhage and risk of      decompensation.  She was given 2 units FFP and 5 mg of IV vitamin K      with reduction in her anticoagulation.  She was seen also in      immediate consultation by the stroke service, Dr. Gaynell Face, who      agreed with management as well as acute management of blood      pressure regimen given risk for decompensation.  A followup head CT      that morning was remarkably stable and the patient's symptoms were      rather benign considering potential deficits based on anatomic       location of hemorrhage.  She was seen in consultation by PT, OT,      and ST who felt that the patient may benefit from outpatient      therapy but no other recommendations for ongoing therapy.  She was      monitored in the near ICU.  On the second day of hospitalization,      attained adequate control of her blood pressure due to risk for      decompensation, but she has done well without complication.      Vascular lab performed carotid duplex, which showed bilaterally no      ICA stenosis and a 2-D echo was performed but result pending at the      time of dictation, to be read by Dr. Theodosia Blender group.  Stroke team      has recommended the discontinuation of Coumadin forever given      intracranial hemorrhage but as recommended the initiation of      aspirin, low dose, 81 mg to begin in 2 weeks for secondary risk      prevention of atherosclerotic disease.  The patient's blood      pressure after the acute setting has been well controlled but      continued the patient on outpatient titration and monitoring of her      blood pressure will be an ongoing issue for further reduction of      risk of stroke management.  This will be per primary care MD, and      she will see her in the next few weeks to arrange for same.  2. Other medical issues:  The patient's other chronic medical issues      are stable as listed.  She will continue her home medications as      before with outpatient followup as described.   Greater than 30 minutes spent on day of discharge for coordination.      Valerie A. Asa Lente, MD  Electronically Signed     VAL/MEDQ  D:  09/23/2008  T:  09/23/2008  Job:  [id number]

## 2010-11-28 NOTE — H&P (Signed)
Jane, [name]                 ACCOUNT NO.:  [id number]   MEDICAL RECORD NO.:  [id number]          PATIENT TYPE:  INP   LOCATION:  6702                         FACILITY:  [facility]   PHYSICIAN:  Farris Has, MDDATE OF BIRTH:  Jan 19, 1946   DATE OF ADMISSION:  11/15/2008  DATE OF DISCHARGE:                              HISTORY & PHYSICAL   PRIMARY CARE PHYSICIAN:  [name], D.O.   CHIEF COMPLAINT:  Abnormal labs.   HISTORY OF PRESENT ILLNESS:  The patient is a 64 year old  female with  history of chronic renal insufficiency, hypertension, stroke and obesity  in atrial fibrillation.  She was seen today by Dr. Fransico [name] and got  regular labs that showed elevated creatinine and elevated potassium at  which point Dr. Fransico [name] informed her to come in to the emergency  department.  In the ED her potassium was  back down to 5.1 from  elevated 5.9 and her creatinine  was still elevated at about 3.8.  Her  baseline is around 1.5 at which point [facility] was called for  admission.   The patient denies any chest pain.  She had been short of breath with  exertion but this been chronic and unchanged.  She had her Lasix dose  recently adjusted by her renal physical because she was having extra  lower extremity swelling as well.  It is now improved.  Otherwise, no  fevers, no chills.  Otherwise review of systems  unremarkable.  No other  complaints.   PAST MEDICAL HISTORY:  1. Atrial fibrillation, paroxysmal.  2. Hypertension.  3. Stroke March 2003.  Stroke was in March.  I am not sure which year.      I think it was this year.  4. Chronic renal insufficiency followed by Dr. Jimmy Footman.  5. Depression.   SOCIAL HISTORY:  The patient never smoked, does not drink, does not  abuse drugs.   FAMILY HISTORY:  Noncontributory.   ALLERGIES:  No known drug allergies.   MEDICATIONS:  1. Diltiazem 360 mg daily.  2. Lasix 80 mg three times a day.  3. __________ mg  once a day.  4. Potassium 20 mEq  three times a day.  5. Sotalol 80 mg twice a day.  6. Toprol XL 100 mg daily.  7. Hectorol 1 mcg daily.  8. Micardis 160 mg daily.  9. Wellbutrin 150 mg daily.  10.Baby aspirin.   PHYSICAL EXAMINATION:  VITAL SIGNS:  Temperature 97, blood pressure was  123/65, pulse 54, respirations 18, oxygen saturation 97% on room air.  GENERAL:  The patient appears to be in no acute distress.  HEENT:  Head nontraumatic.  Moist mucous membranes.  LUNGS:  Clear to auscultation bilaterally but somewhat distant.  No  wheezes appreciated.  HEART:  Regular rate and rhythm.  No murmurs, rubs or gallops.  ABDOMEN:  Obese but nontender and nondistended.  LOWER EXTREMITIES:  Trace edema bilaterally but also extremely obese.  NEUROLOGIC:  The patient is intact.  SKIN:  Clean, dry, intact.   LABORATORY DATA:  Sodium 138, potassium  5.1, creatinine 3.87 which is up  from baseline 1.45, BUN 58.  Her EKG shows no change from prior.  No  evidence of ischemia or infarction.  No imaging studies were obtained.   ASSESSMENT/PLAN:  This is a 65 year old female with acute on chronic  renal failure, presents with abnormal labs.  1. Acute on chronic renal failure.  We will hold ACE inhibitor, ARB,      Lasix for right as probably the patient has been slightly over      diuresed.  Will check renal ultrasound.  Obtain serial creatinine      and obtain urine lytes.  Will hold potassium for now.  2. History of atrial fibrillation.  Currently appears to be in sinus      rhythm.  Will continue sotalol, diltiazem, and  metoprolol holding      parameters.  3. Depression.  Continue Wellbutrin.  4. Prophylaxis.  Protonix and Lovenox.      Farris Has, MD  Electronically Signed     AVD/MEDQ  D:  11/16/2008  T:  11/16/2008  Job:  [id number]   cc:   Jane Artis, MD  Jane Chessman, DO  [name], M.D.

## 2010-11-28 NOTE — Consult Note (Signed)
NAMECRYSTIANA, [name]                 ACCOUNT NO.:  [id number]   MEDICAL RECORD NO.:  [id number]          PATIENT TYPE:  INP   LOCATION:  3715                         FACILITY:  [facility]   PHYSICIAN:  Princess Bruins. Hickling, M.D.DATE OF BIRTH:  1945-08-15   DATE OF CONSULTATION:  DATE OF DISCHARGE:                                 CONSULTATION   CHIEF COMPLAINT:  Left-sided weakness with right caudate and thalamic  primary hemorrhage.   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  The patient fasted yesterday for reasons that are  unclear to me.  She felt dizzy and lightheaded when she was at work  around 10:15 and noticed that she was dragging her leg.   The patient was brought to see Dr. Etter Sjogren and was evaluated around 3:47  p.m. today.  She was noted to have weakness in her left leg and was  transferred to [facility] for immediate admission.   She did not have a CT scan of the brain until after 8:00 p.m. tonight.  This revealed evidence of a primary hemorrhage involving the caudate and  thalamus, some mass effect, some edema, but no penetration into the  ventricles.  The size of the patient's lesion is impressive particularly  when compared against very minimal neurologic findings.  I was asked to  see her to determine the appropriate treatment in this circumstance.   The patient has a history of atrial fibrillation and consequently is on  Coumadin.  She has had problems with hypertension, but currently is  unacceptably high given her hemorrhage.   She is morbidly obese.  She has a history of depression and anxiety.  She also is at risk for osteoporosis.  She has had a history in the past  of asthma.   Her current medications include:  1. Toprol-XL 100 mg once daily.  2. Lisinopril 40 mg once daily.  3. Coumadin 7.5 mg as directed.  4. Furosemide 80 mg 1  twice daily.  5. Sotalol 80 mg 1 twice daily.  6. Cardizem CD 360 mg daily.  7. Klor-Con 20 mEq 1 three times daily.  8. Wellbutrin XR 150 mg 1 daily.  9. Hectorol 0.5 mcg capsules 2 tablets daily.   DRUG ALLERGIES:  None known.   REVIEW OF SYSTEMS:  The patient has been physically feeling well.  She  has not had fever, chills, runny nose, cough, or signs of constitutional  infection.  She has had some shortness of breath with exertion and  complains of problems with falling and poor balance.  The remainder of  her 12-system review as recorded in Dr. Nonda Lou comprehensive office  note is negative.   Family history is positive for diabetes and hypertension in her mother  who is still alive.  Father died of Hodgkin disease 32 years ago.  There  is no history of stroke that she knows of in any first-degree relatives.   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  She is  married and has children.  She is a nonsmoker.  She does not use alcohol  or drugs.  She says that she gets regular exercise.   PAST SURGICAL HISTORY:  The patient had heart valve surgery and also  cholecystectomy.  She has had defibrillation.  She is followed by Fransico Him, cardiologist.   PHYSICAL EXAMINATION:  GENERAL:  Today, this is a morbidly obese, very  pleasant woman in no acute distress.  VITAL SIGNS:  Blood pressure 180/87 resting, pulse 69, respirations 18,  temperature 98, oxygen saturation 97%.  EAR, NOSE, AND THROAT:  No bruits.  NECK:  Supple.  LUNGS:  Clear.  HEART:  No murmurs.  Pulse is normal.  ABDOMEN:  Protuberant.  Bowel sounds normal.  EXTREMITIES:  No edema.  They are pink and warm.  NEUROLOGIC:  Awake and alert without dysphasia.  Cranial nerves round  reactive pupils.  Visual fields full to double simultaneous stimuli.  Symmetric facial strength, midline tongue.  Air conduction greater than  bone  conduction bilaterally.  Motor examination normal strength except  4/5 proximally in the left lower extremity.  She has some drift in left  lower extremity.  Fine motor movements were proximally equal in the  right and left.  Sensation, no hemisensory.  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  She has good vibratory and  proprioceptive sensation and good stereoagnosis.  Cerebellar  examination, good finger-to-nose.  Heel-knee-shin and finger-to-nose on  the left side affected by mild dysmetria and also in the legs.  I  suspect some weakness.  Gait shows a left hemiparetic gait that is mild.  Deep tendon reflexes were absent in the lower extremities, normal in the  upper extremities.  She had bilateral flexor plantar response.   IMPRESSION:  1. Primary hemorrhage of the right caudate and thalamus.  As I stated      above, she should have much more in the way of deficits than she      experiences.  The only condition that I know that does this is      amyloid, but I have never seen it in the deep gray matter. (431)  2. Hypertension.  3. History of atrial fibrillation which is quiescent, but she is on      Coumadin.  4. Obstructive sleep apnea.  These were all risk factors for stroke.   PLAN:  Reverse Coumadin with vitamin K and fresh frozen plasma.  PT and  PTT in the morning.  CT scan of the brain in the morning.  We need to  keep the systolic blood pressure below 116, and I have ordered  labetalol.  If her heart rate begins drop on his, we will have to  transfer to the unit and place her on a Cardene drip.  I appreciate the  opportunity to participate in her care.  She will be followed by the  Stroke Team.      Princess Bruins. Gaynell Face, M.D.  Electronically Signed     WHH/MEDQ  D:  09/21/2008  T:  09/22/2008  Job:  [id number]   cc:   Mateo Flow A. Asa Lente, MD  Kathlene November, MD  Rosalita Chessman, DO

## 2010-11-28 NOTE — Discharge Summary (Signed)
[name], [name]                 ACCOUNT NO.:  [id number]   MEDICAL RECORD NO.:  [id number]          PATIENT TYPE:  INP   LOCATION:  6702                         FACILITY:  [facility]   PHYSICIAN:  Scarlette Calico, MD       DATE OF BIRTH:  11-Apr-1946   DATE OF ADMISSION:  11/16/2008  DATE OF DISCHARGE:  11/17/2008                               DISCHARGE SUMMARY   PRIMARY CARE PHYSICIAN:  Rosalita Chessman, DO   NEPHROLOGIST:  Dr. Jimmy Footman.   CONSULTANTS DURING THIS ADMISSION:  None.   DISCHARGING PHYSICIAN:  Scarlette Calico, MD   DISCHARGE DIAGNOSES:  1. Acute renal insufficiency secondary to over diuresis with      dehydration and also use of ACE inhibitors and angiotensin II      receptor blockers concomitantly.  2. History of atrial fibrillation.  She has maintained sinus rhythm      during this admission.  3. History of thalamic intracranial hemorrhage 2 months ago, doing      well.  4. Coumadin is contraindicated secondary to history of thalamic      intracranial hemorrhage.  5. History of depression, well controlled.  6. Hypertension, which has been controlled on this admission.   Her discharge medications will be;  1. Sotalol 80 mg twice a day.  2. Toprol-XL 100 mg once a day.  3. Hectorol 1 mcg daily.  4. Wellbutrin 150 mg once a day.  5. Cardizem 360 mg once a day.   CONDITION ON DISCHARGE:  She is without complaints.   PHYSICAL EXAMINATION:  GENERAL:  She is pleasant.  CARDIOVASCULAR:  Regular rhythm with no murmur, rub, or gallop.  ABDOMEN:  Obese, otherwise benign.  LUNGS:  Clear anteriorly and posteriorly.  EXTREMITIES:  No edema and pulses are excellent throughout.  NEUROLOGIC:  Nonfocal.   HOSPITAL COURSE:  Remarked by gradual improvement in her renal function  and her blood pressure has been well controlled without the Lasix, ACE  inhibitor or ARB.  Her telemetry has shown that she has been in sinus  rhythm throughout this admission.  Her blood pressures have  been well  controlled.   PERTINENT LABORATORY DATA:  At the time of discharge, her creatinine has  improved, it is now down to 2.37 compared to 3.87 at the time of  admission.  Her BUN has decreased from 58 at admission to 37 at this  time.  She is mildly hypoglycemic with glucose of 138.  Her potassium is  normal at 4.3, sodium is normal at 41, bicarb is normal at 27, and her  GFR is estimated to be 25 mL/hour.  Her calcium is normal at 8.9.   DISCHARGE FOLLOWUP:  1. She will be on a low-sodium diet, but she is asked to slightly      increase her fluid intake secondary to the over diuresis with      prerenal azotemia.  2. She is advised to stop taking Lasix, lisinopril, Micardis, and a      potassium chloride.  3. She is encouraged to follow up with her Cardiologist  at [facility] as      well as Dr. Jimmy Footman her Nephrologist and Dr. Etter Sjogren, her primary      care physician within the next 1-2 weeks.  4. She is asked to return here sooner if she develops any new or      worsening symptoms.  5. Other medications will be continued without any changes.   Greater than 30 minutes was spent on this discharge planning, dictation,  and the paper work.      Scarlette Calico, MD  Electronically Signed     TJ/MEDQ  D:  11/17/2008  T:  11/18/2008  Job:  [id number]   cc:   Rosalita Chessman, DO  Dr. Jimmy Footman.

## 2010-11-28 NOTE — Letter (Signed)
November 10, 2008    [name]. [name], [place] [address], [place], [place] [address]   RE:  [name], [name]  MRN:  [id number]  /  DOB:  [birth year]   Dear Dr. Hassell [name]:   [name] has come to me interested in the lap band surgery.  She is a  65 year old black female with a past medical history of hypertension,  asthma, and most recently CVA.  She has struggled with weight most of  her life and has researched the lap band surgery with her husband and is  interested in this procedure.  She has attempted many diets in the past  including low-salt, low-fat diet in which she lost 75 pounds and gained  most of it back.  She tried weight watchers three times, low-cal diet  and exercise and has been to a nutritionist and all attempts have  failed.  In 2006, she weighed 317 pounds with BMI of 52.7.  In 2007, she  weighed 351 with a BMI of 58.4.  In 2008, she was 270 with a BMI of  44.9.  In 2009, she weighed 300 with a BMI of 49.9.  In 2010, she  weighed 293.5 with a BMI of 49.02.  She understands the risks involved  with the surgery and the dietary as well as other lifestyle modification  that will need to be made before and after surgery.  I believe that Ms.  [name] will be a good candidate for the lap band surgery.  Please feel  free to call with any further questions.    Sincerely,      Jane Chessman, DO  Electronically Signed    Telford Nab  DD: 11/10/2008  DT: 11/11/2008  Job #: [id number]

## 2010-12-01 NOTE — H&P (Signed)
[name], [name]                 ACCOUNT NO.:  [id number]   MEDICAL RECORD NO.:  [id number]          PATIENT TYPE:  INP   LOCATION:  6531                         FACILITY:  [facility]   PHYSICIAN:  Fransico Him, M.D.     DATE OF BIRTH:  Aug 08, 1945   DATE OF ADMISSION:  07/23/2006  DATE OF DISCHARGE:                              HISTORY & PHYSICAL   CHIEF COMPLAINT:  Shortness of breath.   HISTORY:  Ms. Mershon is a 65 year old female who has had a history of  paroxysmal atrial fibrillation and has undergone cardioversion in the  past.  Over the past several months, she is complaining of some  shortness of breath, but more so she has been complaining of  intermittent elevated blood pressure.  As part of her work-up, she was  to have a 24-hour urine to evaluate for pheochromocytoma or aldosterone  secreting tumor, but up until now I do not think that she has had that  done.  In addition, I did have her wear an [employer] blood pressure cuff,  and results today show that her blood pressures are all over the place  with diastolics up to [id number].   Over the past several days, she has noted increasing shortness of breath  that is more so than her chronic shortness of breath.  She comes into  the office today more fatigued, and appears dyspneic even while sitting.   PAST MEDICAL HISTORY:  1. Paroxysmal atrial fibrillation.  2. Morbid obesity.  3. Hypertension.  4. Chronic lower extremity edema secondary to obesity.  5. Sleep apnea.  6. Borderline renal insufficiency.  7. Systemic anticoagulation.  8. Mild left ventricular dysfunction with an EF of 40-45% by echo in      2006.  9. She also has long-term medication use and long-term Coumadin use.   ALLERGIES:  No known drug allergies.   SOCIAL HISTORY:  Married.  Has 2 children.  No tobacco, alcohol or  illicit drug use.   FAMILY HISTORY:  Dad died at age 47 of Hodgkin's disease.  He also had  hypertension and diabetes.  Mom with a history of  hypertension and  diabetes.  Another brother with diabetes and hypertension.  Another  sibling with hypertension, as well.   PAST SURGICAL HISTORY:  1. 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  2. Status post cholecystectomy in 1986.  3. Exploratory surgery in 1982.   CURRENT MEDICATIONS:  1. Tylenol p.r.n.  2. Diltiazem 360 mg a day.  3. Coumadin daily.  4. Toprol-XL 100 mg a day.  5. Potassium 30 mEq b.i.d.  6. Lasix 80 mg b.i.d.  7. Lisinopril 40 mg a day.  8. Multivitamin daily.   PHYSICAL EXAMINATION:  VITAL SIGNS:  Weight 242, pulse 112, blood  pressure 144/110.  HEENT:  Grossly normal.  Sclerae clear.  Conjunctivae normal.  Nares  without drainage.  NECK:  No carotid bruits, no JVD or thyromegaly.  CHEST:  Clear to auscultation bilaterally.  HEART:  Irregular irregular tachycardic.  ABDOMEN:  Obese.  EXTREMITIES:  Lower extremities  with trace peripheral edema.  SKIN:  Warm and dry.  NEUROLOGIC:  Cranial nerves II-XII grossly intact.   ELECTROCARDIOGRAM:  EKG today shows an atrial flutter with atrial  variant, tachycardia with ventricular rate of 106 beats per minute.   ASSESSMENT AND PLAN:  1. Atrial flutter, uncontrolled ventricular rate.  2. History of paroxysmal atrial fibrillation.  3. Chronic shortness of breath.  4. Obesity.  5. Borderline renal insufficiency with last creatinine measured at 1.6      on June 10, 2006.  6. Left ventricular dysfunction, ejection fraction of 40-45% by echo.  7. Sleep apnea followed by Dr. Gwenette Greet.  8. Hypertension.   The patient has been seen and examined by Dr. Radford Pax.  We are admitting  the patient for sotalol loading and will continue to diurese her.  Standard laboratory studies and x-rays will be obtained during the  patient's stay.      Joesphine Bare, P.A.      Fransico Him, M.D.  Electronically Signed    LB/MEDQ  D:  07/23/2006  T:  07/23/2006  Job:  [id number]   cc:   L. Donnie Coffin,  M.D.

## 2010-12-01 NOTE — Assessment & Plan Note (Signed)
[facility]                             PULMONARY OFFICE NOTE   NAME:Hulse, HARINDER ISKANDER                        MRN:          [id number]  DATE:06/25/2006                            DOB:          [birth year]    SLEEP MEDICINE FOLLOWUP:   SUBJECTIVE:  Ms. Buschmann comes in today for followup of her sleep apnea.  She was last seen in April 2007, and was supposed to follow up in 6  months.  Apparently she has had difficulty with her mask and was very  slow to get a replacement.  This resulted in decreased compliance using  her CPAP machine.  She currently has a new mask and feels that she is  wearing this just about every night for most of the night.  She clearly  sees a difference in the morning and also during the next day whenever  she wears the mask versus times that she does not.  Her main complaint  is that she has to get up frequently during the night to go to the  bathroom and is pulling her mask off, rather than disconnecting it from  the clip which would make it much easier to get back to sleep without  having to replace the mask.  She has no problems with the pressure or  the mask fit currently.   PHYSICAL EXAMINATION:  GENERAL:  She is a morbidly obese black female in  no acute distress.  VITAL SIGNS:  Blood pressure is 144/88, pulse 78, temperature is 97.8,  weight is 343 pounds, O2 saturation on room air is 93%.  Of note, her  weight is up about 20 pounds since the last visit.  SKIN:  There is no evidence of skin breakdown or pressure necrosis from  the CPAP mask.   IMPRESSION:  Obstructive sleep apnea which is significantly improved  whenever she wears CPAP on a consistent basis.  I have encouraged her to  do so.  I have also asked her to not take the mask off whenever she gets  up during the night but unclip it from the hose so that it remains in  place whenever she goes to the bathroom.  This should help compliance as  well.  She also needs to  keep up with her supplies and mask  fitting/cleaning.  I have encouraged her to work aggressively on weight  loss since she has gained 20 pounds since the last time I saw her.   PLAN:  1. Work on weight loss.  2. Continue on CPAP at current level and we will get a download of her      compliance within a few months.  3. The patient will follow up in six months or sooner if there are      problems.     Kathee Delton, MD,FCCP  Electronically Signed    [facility]/MedQ  DD: 06/25/2006  DT: 06/25/2006  Job #: [id number]   cc:   L. Donnie Coffin, M.D.  Fransico Him, M.D.

## 2010-12-01 NOTE — Op Note (Signed)
NAMEJIM, [name]                 ACCOUNT NO.:  [id number]   MEDICAL RECORD NO.:  [id number]          PATIENT TYPE:  OIB   LOCATION:  2899                         FACILITY:  [facility]   PHYSICIAN:  Fransico Him, M.D.     DATE OF BIRTH:  September 06, 1945   DATE OF PROCEDURE:  12/21/2004  DATE OF DISCHARGE:                                 OPERATIVE REPORT   REFERRING PHYSICIAN:  L. Donnie Coffin, M.D.   PROCEDURE:  Direct current cardioversion.   OPERATOR:  Fransico Him, M.D.   INDICATIONS FOR PROCEDURE:  Atrial fibrillation.   COMPLICATIONS:  None.   IV MEDICATIONS:  Pentothal  225 mg.   DESCRIPTION OF PROCEDURE:  The patient was brought to the day hospital in  the fasting, nonsedated state.  Informed consent was obtained.  The patient  was connected to the continuous heart rate and pulse oximetry monitoring and  intermittent blood pressure monitoring.  Defibrillation pads were placed on  the left anterior chest and posterior back and after adequate anesthesia was  obtained by anesthesiologist, 100 joule synchronized biphasic shock was  delivered which was unsuccessful in converting the patient to normal sinus  rhythm.  A synchronized biphasix 200 joule shock was successful in  converting the patient into normal sinus rhythm.  The patient tolerated the  procedure well without complications.   ASSESSMENT:  1.  Atrial fibrillation.  2.  Systemic anticoagulation with therapeutic INR greater than four weeks.  3.  Sleep apnea.  4.  Hypertension.  5.  Status post successful DC cardioversion to normal sinus rhythm.   PLAN:  Discharged home after bed rest and fully awake.  Follow-up in office  with my nurse practitioner.       TT/MEDQ  D:  12/21/2004  T:  12/21/2004  Job:  [id number]   cc:   L. Donnie Coffin, M.D.  [address]  [place] [address]  Fax: [phone]

## 2010-12-01 NOTE — Op Note (Signed)
[name], MISHER                           ACCOUNT NO.:  [id number]   MEDICAL RECORD NO.:  [id number]                   PATIENT TYPE:  AMB   LOCATION:  [place]                                  FACILITY:  [facility]   PHYSICIAN:  Hetty Blend, M.D.             DATE OF BIRTH:  Jan 04, 1946   DATE OF PROCEDURE:  09/10/2002  DATE OF DISCHARGE:  09/10/2002                                 OPERATIVE REPORT   PREOPERATIVE DIAGNOSES:  1. Left lower lateral eyelid soft tissue mass.  2. Left medial lower eyelid suspicious skin lesion.   POSTOPERATIVE DIAGNOSES:  1. Left lower lateral eyelid soft tissue mass.  2. Left medial lower eyelid suspicious skin lesion.   PROCEDURES:  1. Excision of 1.0-cm left lower eyelid soft tissue mass.  2. Excision of 0.4-cm left medial lower eyelid suspicious skin mass.   ATTENDING SURGEON:  Hetty Blend, M.D.   ANESTHESIA:  Lidocaine 1% with epinephrine.   COMPLICATIONS:  None.   INDICATION FOR THE PROCEDURE:  The patient is a 65 year old African American  female who has two lesions growing on her lower eyelid.  One is a  maculopapular skin lesion along the medial aspect.  However, she appears to  have a soft tissue mass present along the lateral aspect of the lower eyelid  that appears more cyst-like.  She now presents to undergo excision of these  lesions.   DESCRIPTION OF PROCEDURE:  The patient was brought back into the minor room  and placed on the table in a supine position.  The left periorbital area was  prepped with Betadine and draped in a sterile fashion.  The skin and  subcutaneous tissue of the areas of the lateral lower eyelid mass as well as  medial lower eyelid skin incision were injected with 1% lidocaine with  epinephrine.  After adequate hemostasis and anesthesia had taken effect, the  procedure was begun.  First the medial skin lesion was excised.  This skin  lesion was excised with about a millimeter margin around the base of  the  lesion, full-thickness through the skin.  It was then passed off the table  to undergo permanent pathologic section and evaluation.  The skin was then  closed using 6-0 Prolene suture.  An incision was made around the base of  the lateral lower eyelid skin lesion.  Upon examining this, it did appear  that this was a cyst in nature.  All of the mass was removed with its lining  and passed off the table to undergo permanent pathologic section and  evaluation.  The skin was then closed using 6-0 Prolene in a running  baseball-type stitch.  There were no complications.  The patient tolerated  the procedure well.  The incisions were dressed with Bacitracin ointment.   This patient was taught proper postoperative wound care instructions.  She  was then discharged home in stable condition.  Followup appointment will be  tomorrow in the office.                                                 Hetty Blend, M.D.    MC/MEDQ  D:  09/12/2002  T:  09/12/2002  Job:  [id number]

## 2010-12-01 NOTE — Op Note (Signed)
[name], [name]                 ACCOUNT NO.:  [id number]   MEDICAL RECORD NO.:  [id number]          PATIENT TYPE:  OIB   LOCATION:  2899                         FACILITY:  [facility]   PHYSICIAN:  Fransico Him, M.D.     DATE OF BIRTH:  1945/10/08   DATE OF PROCEDURE:  08/16/2006  DATE OF DISCHARGE:                               OPERATIVE REPORT   PROCEDURE:  Direct current cardioversion.   OPERATOR:  Fransico Him, M.D.   INDICATIONS:  Atrial fibrillation.   COMPLICATIONS:  None.   IV MEDICATIONS:  Pentothal 130 mg.   This is a 65 year old African American female with a history of  hypertensive cardiomyopathy, mild renal insufficiency, hypertension  poorly-controlled, and paroxysmal atrial fibrillation, who recently  underwent loading with sotalol for atrial fibrillation.  She now  presents for cardioversion after documenting of 4 weeks of INRs greater  than 2.   The patient was brought to the day hospital the fasting, nonsedated  state.  Informed consent was obtained.  The patient was connected to  continuous heart rate and pulse oximetry monitoring and intermittent  blood pressure monitoring.  Defibrillator pads were placed on the  anterior and posterior left chest and after adequate anesthesia was  obtained, a synchronized biphasic 50-joule shock was delivered, which  successfully converted the patient to normal sinus rhythm.  The patient  tolerated the procedure well without complications.  The patient, after  fully awake and ambulating, was subsequently discharged to home.   ASSESSMENT:  1. Atrial fibrillation, status post sotalol load.  2. Successful direct current cardioversion to normal sinus rhythm.  3. Systemic anticoagulation with therapeutic INR greater than 2 x4      weeks.  4. Hypertensive cardiomyopathy, ejection fraction 45%.  5. Hypertension.  6. Mild renal insufficiency.   PLAN:  Discharged to home after fully awake and ambulatory.  Follow up  with me in  2 weeks on February 15 at 9 a.m.      Fransico Him, M.D.  Electronically Signed     TT/MEDQ  D:  08/16/2006  T:  08/16/2006  Job:  [id number]   cc:   L. Donnie Coffin, M.D.  [facility]

## 2010-12-01 NOTE — Discharge Summary (Signed)
[name], [name]                 ACCOUNT NO.:  [id number]   MEDICAL RECORD NO.:  [id number]          PATIENT TYPE:  INP   LOCATION:  [id number]                         FACILITY:  [facility]   PHYSICIAN:  Fransico Him, M.D.     DATE OF BIRTH:  20-May-1946   DATE OF ADMISSION:  07/23/2006  DATE OF DISCHARGE:  07/28/2006                               DISCHARGE SUMMARY   ADMISSION DIAGNOSIS:  1. Worsening dyspnea.  2. Palpitations.   DISCHARGE DIAGNOSES:  1. Worsening dyspnea secondary to acute CHF exacerbation with      increased BNP, compensated with IV diuretics.  2. Palpitations secondary to atrial flutter, status post sotalol      loading, rate controlled.  3. History of atrial fibrillation.  4. History of atrial fibrillation status post DCCV in the past with      conversion to normal sinus rhythm.  5. Systemic anticoagulation with therapeutic INR.  6. Hypertension.  7. Renal insufficiency  8. Bilateral adrenal adenoma via abdominal CT angiography.  9. Chronic obstructive pulmonary disease via VQ scan.  10.Obstructive sleep apnea.  11.Systemic anticoagulation with Coumadin therapy.  12.Gastroesophageal reflux disease.  13.Anasarca.  14.Pulmonary hypertension.   PROCEDURES:  None during this admission.   CONSULTATIONS:  1. Endocrinology  2. Pulmonology   HOSPITAL COURSE:  [name] is a 65 year old African American female  with a known history of paroxysmal atrial fibrillation, hypertension,  obstructive sleep apnea, and mild LV dysfunction with an EF of 40-50%.  She has been cardioverted in the past to normal sinus rhythm; however,  was admitted on 07/23/2006 with complaints of worsening dyspnea and  palpitations.  She was admitted with a diagnosis of an acute CHF  exacerbation secondary to mild LV systolic dysfunction.  She was also  admitted with a diagnosis of atrial flutter pending a sotalol load.  Point of care enzymes were negative x3 with a peak troponin of 0.02.  The  patient was continued on Diltiazem 360 mg daily.  She was also  continued on systemic anticoagulation with therapeutic INR.  Her D-dimer  was elevated and a VQ scan revealed COPD changes.  However, it was  negative for pulmonary embolism.   A 2-D echocardiogram was obtained revealing a normal LV systolic  function with an EF of 50-55%.  There was no diagnostic evidence of  regional wall motion abnormalities.  The patient was continued on  metoprolol XL 100 mg plus lisinopril 40 mg daily.  However, clonidine  0.1 mg twice daily was added for better blood pressure control.  IV  Lasix was changed to twice daily for improved diuresis.   The patient has a history of renal insufficiency, so her creatinine was  closely monitored while gently diuresing her.  As her BNP began to  decline, the patient's IV Lasix was switched to oral Lasix.  Abdominal  CT angiography revealed bilateral adrenal adenoma, so an endocrinology  consult was called into [facility].  A pulmonology  consult was also called secondary to hypoxemia with the recommendation  to discontinue lisinopril, and instead start Avapro 300 mg  daily.  It  was also recommended that the patient was started on Spiriva and to  administer her CPAP at bedtime.  Pulmonology checked her blood gases and  continued to follow her pulmonary function. Daily EKGs revealed that the  patient's QTC was stable on sotalol therapy.  Home O2 was ordered for  the patient.  As her breathing improved, but was not stable enough for  her to avoid becoming hypoxic on room air with an O2 saturation in the  80s.  She was discharged to home in stable condition on 07/28/2006  without any further complaints of palpitations, shortness of breath, or  chest pain.  She remained in atrial flutter; however, was rate  controlled.   LABORATORY DATA:  Serial cardiac enzymes:  CK total 88, 78, and 96,  respectively; CK-MB 1.2 times two and 1.5; troponin I 0.02  times two and  0.01.  BNP 286, 275, and 206 respectively.  Sodium 145, potassium 4.1,  chloride 105, CO2 36, glucose 98, BUN 30, creatinine 1.65.  PT 27.8, INR  2.4.  White blood count 8.2, hemoglobin 12.3, hematocrit 36.9, platelets  250,000.  TSH 2.275.  Fasting lipid panel total cholesterol 154,  triglycerides 72, HDL 50, LDL 90.  D-dimer 1.08 PTT 37, total bilirubin  1.0, alkaline phosphatase 100, AST 27, ALT 32, total protein 6.5, serum  albumin 2.7.   X-RAYS:  As stated in the hospital course.   EKG:  A 12-lead EKG on 07/28/2006 revealed atrial flutter with variable  AV block with a ventricular rate of 65 beats per minute with nonspecific  ST-segment T wave abnormalities.  There was no evidence of acute ST-  segment/T-wave changes   CONDITION ON DISCHARGE:  Stable.   DISCHARGE MEDICATIONS:  1. Sotalol 80 mg twice daily.  This represented a new prescription.  A      prescription was given with refills.  2. Lasix 80 mg twice daily.  3. Avapro 300 mg daily.  4. Toprol XL 100 mg daily.  5. Diltiazem CD 360 mg daily.  6. Potassium chloride 30 mEq twice daily.  7. Clonidine 0.1 mg twice daily.  This represented a new prescription;      a prescription was given with refills.  8. Coumadin take as directed.   DISCHARGE INSTRUCTIONS:  1. Follow a low-salt, low-fat, and low cholesterol diet.  2. Call MD's office for recurrent shortness of breath, palpitations,      swelling, and for weight gain of 2 pounds overnight or 3-5 pounds      in a 1-week time period.  3. Monitor salt and fluid.  4. Weigh daily and record weight.   FOLLOW UP/ARRANGEMENTS:  1. EKG at [facility] on August 02, 2006 at 10 o'clock a.m.  2. Followup appointment with Dr. Gwenette Greet at [facility] on      August 01, 2006 at 10:30 a.m.  The patient was scheduled to call      [phone] if she is unable to keep the scheduled appointment. 3. Followup appointment with Dr. Golden Hurter with a STAT BMET on       08/12/2006 at 3:15 p.m.      Raiford Simmonds, [place]      Fransico Him, M.D.  Electronically Signed    RDM/MEDQ  D:  09/13/2006  T:  09/13/2006  Job:  [id number]   cc:   Fransico Him, M.D.  Donavan Burnet, M.D.

## 2010-12-01 NOTE — Consult Note (Signed)
NAMEDIOR, SCHIEFFER                 ACCOUNT NO.:  [id number]   MEDICAL RECORD NO.:  [id number]          PATIENT TYPE:  INP   LOCATION:  6531                         FACILITY:  [facility]   PHYSICIAN:  Burnett Harry. Joya Gaskins, MD, FCCPDATE OF BIRTH:  1945-11-09   DATE OF CONSULTATION:  DATE OF DISCHARGE:                                 CONSULTATION   CHIEF COMPLAINT:  Hypoxemia.   HISTORY OF PRESENT ILLNESS:  A 65 year old morbidly obese female  followed for years for obstructive sleep apnea on CPAP therapy.  I do  not know the pressure settings per Dr. Gwenette Greet.  The patient has now  progressive anasarca, fluid retention, weight gain, pulmonary  hypertension, hypoxemia.  She was admitted for hypertension control and  paroxysmal atrial flutters.  She was placed on Sotalol to control this  along with Diltiazem.  She is already on an ACE inhibitor.  She has had  chronic cough for months with dyspnea.  She has occasional throat  closure and soreness in the throat.  No overt signs of reflux.  She had  history of asthma as a child.  Lifelong nonsmoker.  Hypertension poorly  controlled.  She is referred for evaluation of hypoxemia.  Saturations  in the mid-80s here on room air.  She is about to be sent home on home  oxygen.  We have been asked by cardiology to reevaluate prior to  discharge.   PAST MEDICAL HISTORY:  Medical:  1. History of paroxysmal atrial fibrillation.  2. Morbid obesity.  3. Hypertension.  4. Chronic lower extremity edema.  5. Obstructive sleep apnea.  6. Chronic renal insufficiency.  7. Chronic anticoagulation.  8. Mild LV dysfunction.  9. EF 40 to 45%.  10.Long-term use of Coumadin.   ALLERGIES:  None.   SOCIAL HISTORY:  Married.  Has two children.  Does not drink or smoke.   FAMILY HISTORY:  Dad died at age 64 of Hodgkin's disease; also had  hypertension and diabetes.  Mother had hypertension and diabetes.  Brother had diabetes and hypertension.  Another sibling with  hypertension.   SURGICAL HISTORY:  1. Open-heart surgery with ASD repair in 1959.  2. Cholecystectomy in [place].  3. Exploratory surgery in 1982.   MEDICATIONS PRIOR TO ADMISSION:  1. Diltiazem 360 daily.  2. Coumadin daily.  3. Toprol XL 100 mg daily  4. Potassium b.i.d.  5. Lasix 80 b.i.d.  6. Lisinopril 40 daily.  7. Multivitamin daily.  8. Currently, she is on the same set of medications as on admission      except that she is now on sotalol 80 mg b.i.d., also clonidine at      0.1 mg b.i.d.   PHYSICAL EXAMINATION:  GENERAL:  A morbidly obese female weighing 240  pounds, saturation 86% on room air.  Blood pressure 140/100 initially,  in no overt acute distress.  CHEST:  Decreased breath sounds at the bases.  No rales, wheezes, or  rhonchi.  There was prominent pseudo wheeze heard in the upper airways.  CARDIAC:  A regular rate and rhythm without S2, normal S1,  S2.  ABDOMEN:  Morbidly obese with some edema.  There was also edema to the  knees bilateral which was tense and brawny.  HEENT:  No jugular venous distention, lymphadenopathy.  Oropharynx  clear.  NECK:  Supple.   LABORATORY DATA:  Sodium 139, potassium 4, chloride 103, CO2 31, BUN 24,  creatinine 1.6, calcium 8.3, INR is 2.3, white count 8.2, hemoglobin  12.9.  The BNP 286.  Chest x-ray showed chronic hyper expanded lungs  with patchy diffuse air trappings, cardiomegaly.  VQ scan showed no  evidence of embolus.  Echocardiogram showed an ejection fraction down 50  to 55% with mild to moderate pulmonary hypertension.   IMPRESSION:  1. Morbid obesity with chronic restrictive disease as well as      obstructive airway disease, most likely with obstructive sleep      apnea, obesity hypoventilation syndrome.  2. Anasarca.  3. Pulmonary hypertension.  4. Also upper airway obstruction.  5. Vocal cord dysfunction.  6. Reflux disease aggravating chronic cough with ACE inhibitor      exacerbating above.  7. Chronic  renal insufficiency.  8. Chronic atrial fibrillation.  9. Hypertension.   RECOMMENDATIONS:  1. Hold discharge for an additional one to two days to readjust      medications.  2. Diurese more and follow renal function closely.  3. Administer CPAP h.s. at 14 cm of water pressure.  4. Check blood gas and pulmonary function.  5. Discontinue ACE inhibitor, lisinopril, and begin Avapro instead 300      mg daily.  6. Dr. Gwenette Greet will follow up this weekend.  7. Begin b.i.d. Protonix.  8. Begin Spiriva.      Burnett Harry Joya Gaskins, MD, [facility]  Electronically Signed     PEW/MEDQ  D:  07/26/2006  T:  07/26/2006  Job:  [id number]   cc:   L. Donnie Coffin, M.D.  Kathee Delton, MD,FCCP  Fransico Him, M.D.

## 2010-12-15 ENCOUNTER — Institutional Professional Consult (permissible substitution): Payer: 59 | Admitting: Pulmonary Disease

## 2011-01-03 ENCOUNTER — Other Ambulatory Visit: Payer: Self-pay | Admitting: Family Medicine

## 2011-01-03 DIAGNOSIS — Z1231 Encounter for screening mammogram for malignant neoplasm of breast: Secondary | ICD-10-CM

## 2011-01-12 ENCOUNTER — Ambulatory Visit (HOSPITAL_COMMUNITY)
Admission: RE | Admit: 2011-01-12 | Discharge: 2011-01-12 | Disposition: A | Discharge status: Home / Self Care | Payer: 59 | Source: Ambulatory Visit | Attending: Family Medicine | Admitting: Family Medicine

## 2011-01-12 DIAGNOSIS — Z1231 Encounter for screening mammogram for malignant neoplasm of breast: Secondary | ICD-10-CM

## 2011-01-16 ENCOUNTER — Encounter: Payer: 59 | Attending: Surgery | Admitting: *Deleted

## 2011-01-16 DIAGNOSIS — Z9884 Bariatric surgery status: Secondary | ICD-10-CM | POA: Insufficient documentation

## 2011-01-16 DIAGNOSIS — Z713 Dietary counseling and surveillance: Secondary | ICD-10-CM | POA: Insufficient documentation

## 2011-01-16 DIAGNOSIS — Z09 Encounter for follow-up examination after completed treatment for conditions other than malignant neoplasm: Secondary | ICD-10-CM | POA: Insufficient documentation

## 2011-02-02 ENCOUNTER — Encounter: Payer: Self-pay | Admitting: Family Medicine

## 2011-02-02 ENCOUNTER — Ambulatory Visit (INDEPENDENT_AMBULATORY_CARE_PROVIDER_SITE_OTHER): Payer: 59 | Admitting: Family Medicine

## 2011-02-02 DIAGNOSIS — Z01818 Encounter for other preprocedural examination: Secondary | ICD-10-CM

## 2011-02-02 NOTE — Progress Notes (Signed)
   Subjective:    [name] is a 65 y.o. female who presents to the office today for a preoperative consultation at the request of surgeon Dr Alvan Dame who plans on performing a Total R Hip Replacement  on August 13. This consultation is requested for the specific conditions prompting preoperative evaluation (i.e. because of potential affect on operative risk): A fib , and kidney disease. Planned anesthesia: general. The patient has the following known anesthesia issues: none. Patients bleeding risk: no recent abnormal bleeding. Patient does not have objections to receiving blood products if needed.  The following portions of the patient's history were reviewed and updated as appropriate: allergies, current medications, past family history, past medical history, past social history, past surgical history and problem list.  Review of Systems Pertinent items are noted in HPI.    Objective:    BP 120/80  Pulse 56  Temp(Src) 97.8 F (36.6 C) (Oral)  Wt 260 lb (117.935 kg)  SpO2 95% General appearance: alert, cooperative, appears stated age, no distress and morbidly obese Ears: normal TM's and external ear canals both ears Nose: Nares normal. Septum midline. Mucosa normal. No drainage or sinus tenderness. Throat: lips, mucosa, and tongue normal; teeth and gums normal Neck: no adenopathy, no carotid bruit, no JVD, supple, symmetrical, trachea midline and thyroid not enlarged, symmetric, no tenderness/mass/nodules Lungs: clear to auscultation bilaterally Heart: S1, S2 normal  + 2/6 murmur Abdomen: soft, non-tender; bowel sounds normal; no masses,  no organomegaly Extremities: extremities normal, atraumatic, no cyanosis or edema Skin: Skin color, texture, turgor normal. No rashes or lesions Lymph nodes: Cervical, supraclavicular, and axillary nodes normal. Neurologic: Mental status: Alert, oriented, thought content appropriate Cranial nerves: normal Motor: pain in R hip Gait:  Antalgic Psych--no depression, no anxiety  Predictors of intubation difficulty:  Morbid obesity? yes - pt is losing weight  Anatomically abnormal facies? no  Prominent incisors? no  Receding mandible? no  Short, thick neck? no  Neck range of motion: norma  Dentition: normal per dentist  Cardiographics ECG: per cardio Echocardiogram: n/a  Imaging Chest x-ray: Not done   Lab Review  not applicable    Assessment:      65 y.o. female with planned surgery as above.   Known risk factors for perioperative complications: Renal dysfunction   Difficulty with intubation is not anticipated.  Cardiac Risk Estimation: see cardiology clearance by Dr Radford Pax  Current medications which may produce withdrawal symptoms if withheld perioperatively: none   Plan:    1. Preoperative workup as follows none. 2. Change in medication regimen before surgery: none, continue medication regimen including morning of surgery, with sip of water. 3. Prophylaxis for cardiac events with perioperative beta-blockers: should be considered, specific regimen per anesthesia. 4. Invasive hemodynamic monitoring perioperatively: at the discretion of anesthesiologist. 5. Deep vein thrombosis prophylaxis postoperatively:regimen to be chosen by surgical team. 6. Surveillance for postoperative MI with ECG immediately postoperatively and on postoperative days 1 and 2 AND troponin levels 24 hours postoperatively and on day 4 or hospital discharge (whichever comes first): at the discretion of anesthesiologist. 7. Other measures: per cardiology and nephrology

## 2011-02-14 HISTORY — PX: TOTAL HIP ARTHROPLASTY: SHX124

## 2011-02-19 ENCOUNTER — Other Ambulatory Visit: Payer: Self-pay | Admitting: Orthopedic Surgery

## 2011-02-19 ENCOUNTER — Other Ambulatory Visit (HOSPITAL_COMMUNITY): Payer: Self-pay | Admitting: Orthopedic Surgery

## 2011-02-19 ENCOUNTER — Ambulatory Visit (HOSPITAL_COMMUNITY)
Admission: RE | Admit: 2011-02-19 | Discharge: 2011-02-19 | Disposition: A | Discharge status: Home / Self Care | Payer: 59 | Source: Ambulatory Visit | Attending: Orthopedic Surgery | Admitting: Orthopedic Surgery

## 2011-02-19 ENCOUNTER — Encounter (HOSPITAL_COMMUNITY): Payer: 59

## 2011-02-19 DIAGNOSIS — Z7982 Long term (current) use of aspirin: Secondary | ICD-10-CM | POA: Insufficient documentation

## 2011-02-19 DIAGNOSIS — M1610 Unilateral primary osteoarthritis, unspecified hip: Secondary | ICD-10-CM | POA: Insufficient documentation

## 2011-02-19 DIAGNOSIS — Z01810 Encounter for preprocedural cardiovascular examination: Secondary | ICD-10-CM | POA: Insufficient documentation

## 2011-02-19 DIAGNOSIS — R9431 Abnormal electrocardiogram [ECG] [EKG]: Secondary | ICD-10-CM | POA: Insufficient documentation

## 2011-02-19 DIAGNOSIS — I517 Cardiomegaly: Secondary | ICD-10-CM | POA: Insufficient documentation

## 2011-02-19 DIAGNOSIS — Z01818 Encounter for other preprocedural examination: Secondary | ICD-10-CM | POA: Insufficient documentation

## 2011-02-19 DIAGNOSIS — I519 Heart disease, unspecified: Secondary | ICD-10-CM

## 2011-02-19 DIAGNOSIS — M169 Osteoarthritis of hip, unspecified: Secondary | ICD-10-CM | POA: Insufficient documentation

## 2011-02-19 DIAGNOSIS — Z01812 Encounter for preprocedural laboratory examination: Secondary | ICD-10-CM | POA: Insufficient documentation

## 2011-02-19 DIAGNOSIS — Z79899 Other long term (current) drug therapy: Secondary | ICD-10-CM | POA: Insufficient documentation

## 2011-02-19 DIAGNOSIS — I10 Essential (primary) hypertension: Secondary | ICD-10-CM | POA: Insufficient documentation

## 2011-02-19 DIAGNOSIS — I428 Other cardiomyopathies: Secondary | ICD-10-CM | POA: Insufficient documentation

## 2011-02-19 LAB — SURGICAL PCR SCREEN
MRSA, PCR: NEGATIVE
Staphylococcus aureus: POSITIVE — AB

## 2011-02-19 LAB — DIFFERENTIAL
Basophils Absolute: 0.1 10*3/uL (ref 0.0–0.1)
Basophils Relative: 1 % (ref 0–1)
Eosinophils Absolute: 0.3 10*3/uL (ref 0.0–0.7)
Eosinophils Relative: 3 % (ref 0–5)
Lymphocytes Relative: 28 % (ref 12–46)
Lymphs Abs: 2.5 10*3/uL (ref 0.7–4.0)
Monocytes Absolute: 0.6 10*3/uL (ref 0.1–1.0)
Monocytes Relative: 6 % (ref 3–12)
Neutro Abs: 5.8 10*3/uL (ref 1.7–7.7)
Neutrophils Relative %: 63 % (ref 43–77)

## 2011-02-19 LAB — URINALYSIS, ROUTINE W REFLEX MICROSCOPIC
Bilirubin Urine: NEGATIVE
Glucose, UA: NEGATIVE mg/dL
Ketones, ur: NEGATIVE mg/dL
Nitrite: POSITIVE — AB
Protein, ur: 100 mg/dL — AB
Specific Gravity, Urine: 1.011 (ref 1.005–1.030)
Urobilinogen, UA: 0.2 mg/dL (ref 0.0–1.0)
pH: 6 (ref 5.0–8.0)

## 2011-02-19 LAB — CBC
HCT: 37 % (ref 36.0–46.0)
Hemoglobin: 11.7 g/dL — ABNORMAL LOW (ref 12.0–15.0)
MCH: 25.7 pg — ABNORMAL LOW (ref 26.0–34.0)
MCHC: 31.6 g/dL (ref 30.0–36.0)
MCV: 81.3 fL (ref 78.0–100.0)
Platelets: 296 10*3/uL (ref 150–400)
RBC: 4.55 MIL/uL (ref 3.87–5.11)
RDW: 16.5 % — ABNORMAL HIGH (ref 11.5–15.5)
WBC: 9.2 10*3/uL (ref 4.0–10.5)

## 2011-02-19 LAB — BASIC METABOLIC PANEL
BUN: 49 mg/dL — ABNORMAL HIGH (ref 6–23)
CO2: 30 mEq/L (ref 19–32)
Calcium: 9.5 mg/dL (ref 8.4–10.5)
Chloride: 102 mEq/L (ref 96–112)
Creatinine, Ser: 1.97 mg/dL — ABNORMAL HIGH (ref 0.50–1.10)
GFR calc Af Amer: 31 mL/min — ABNORMAL LOW (ref >60)
GFR calc non Af Amer: 26 mL/min — ABNORMAL LOW (ref >60)
Glucose, Bld: 88 mg/dL (ref 70–99)
Potassium: 3.7 mEq/L (ref 3.5–5.1)
Sodium: 140 mEq/L (ref 135–145)

## 2011-02-19 LAB — PROTIME-INR
INR: 0.94 (ref 0.00–1.49)
Prothrombin Time: 12.8 seconds (ref 11.6–15.2)

## 2011-02-19 LAB — URINE MICROSCOPIC-ADD ON

## 2011-02-19 LAB — ABO/RH: ABO/RH(D): O POS

## 2011-02-19 LAB — APTT: aPTT: 36 seconds (ref 24–37)

## 2011-02-19 IMAGING — CR DG CHEST 2V
2 series · 2 of 2 positions shown · non-contrast
Comparison: Chest [DATE].

CLINICAL DATA: Preoperative respiratory films.

CHEST - 2 VIEW

[w chest pa]
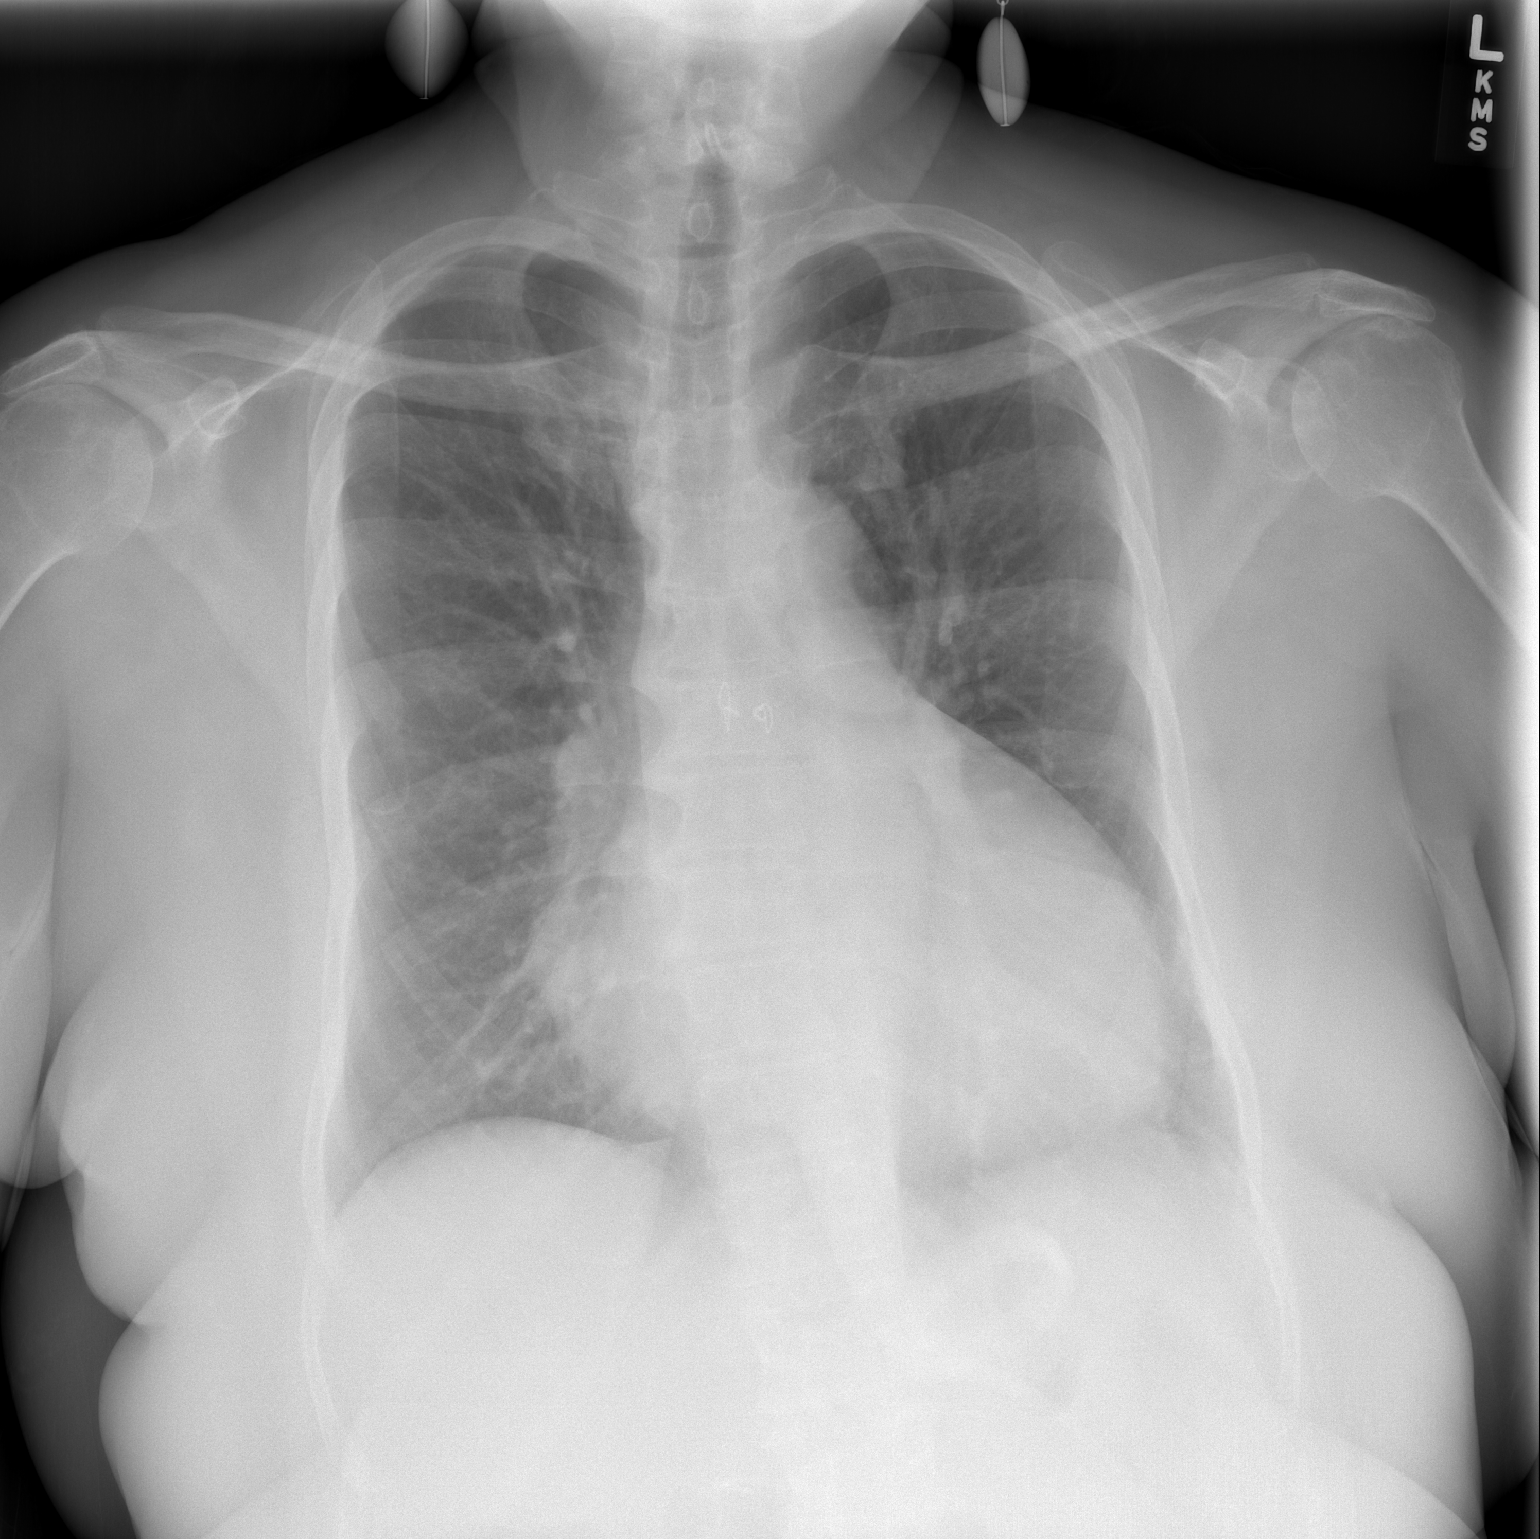

[w chest lat *]
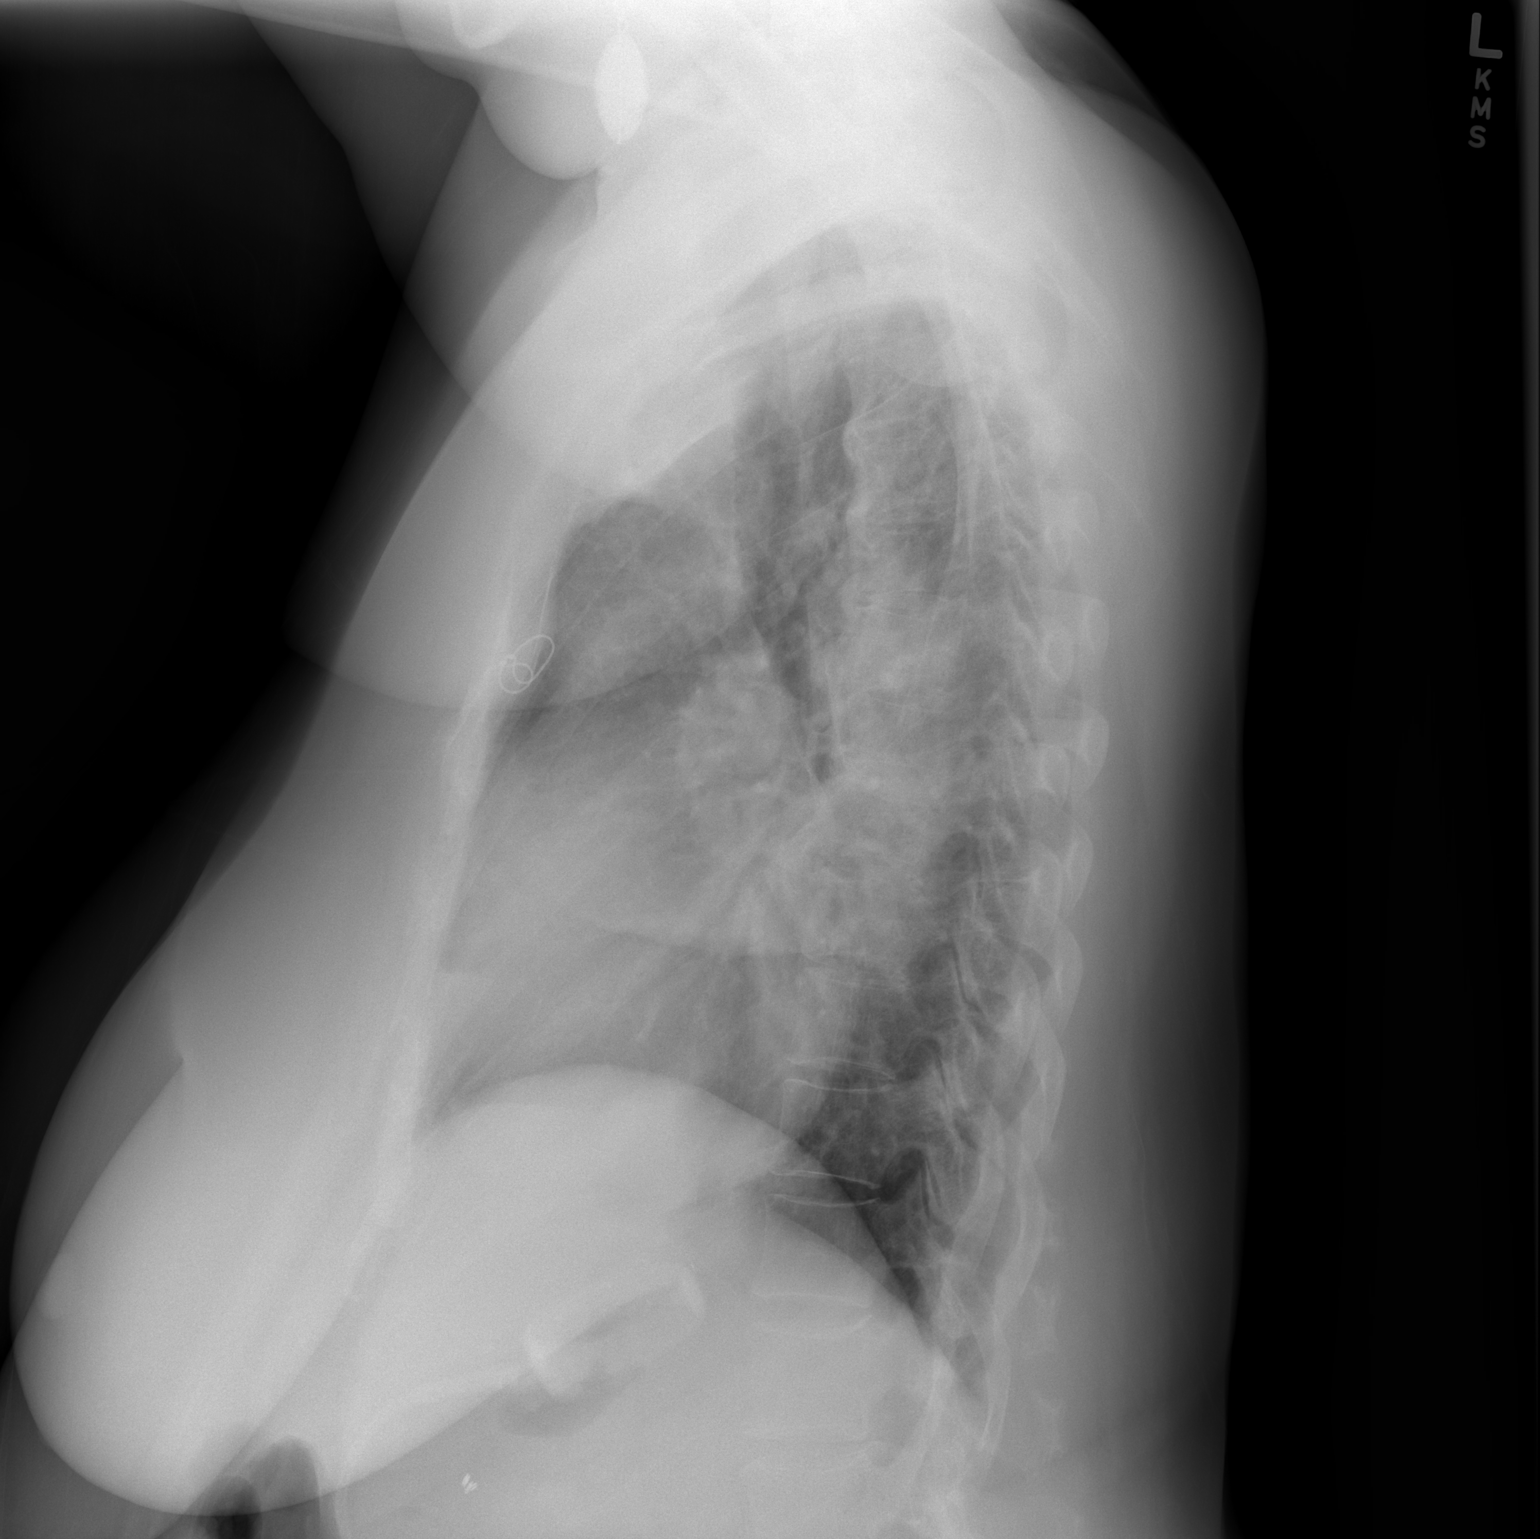

[2 of 2 positions shown; findings below may reference images not displayed]

FINDINGS: There is cardiomegaly but no pulmonary edema.  Lungs are
clear.  No pneumothorax or pleural effusion.  Postoperative change
of lap band placement noted.
IMPRESSION: Cardiomegaly without acute disease.

## 2011-02-23 ENCOUNTER — Encounter (INDEPENDENT_AMBULATORY_CARE_PROVIDER_SITE_OTHER): Payer: Self-pay

## 2011-02-26 ENCOUNTER — Inpatient Hospital Stay (HOSPITAL_COMMUNITY): Payer: 59

## 2011-02-26 ENCOUNTER — Inpatient Hospital Stay (HOSPITAL_COMMUNITY)
Admission: RE | Admit: 2011-02-26 | Discharge: 2011-02-28 | DRG: 470 | Disposition: A | Discharge status: Home Health | Payer: 59 | Source: Ambulatory Visit | Attending: Orthopedic Surgery | Admitting: Orthopedic Surgery

## 2011-02-26 DIAGNOSIS — N289 Disorder of kidney and ureter, unspecified: Secondary | ICD-10-CM | POA: Diagnosis present

## 2011-02-26 DIAGNOSIS — I4891 Unspecified atrial fibrillation: Secondary | ICD-10-CM | POA: Diagnosis present

## 2011-02-26 DIAGNOSIS — Z8673 Personal history of transient ischemic attack (TIA), and cerebral infarction without residual deficits: Secondary | ICD-10-CM

## 2011-02-26 DIAGNOSIS — M169 Osteoarthritis of hip, unspecified: Principal | ICD-10-CM | POA: Diagnosis present

## 2011-02-26 DIAGNOSIS — M109 Gout, unspecified: Secondary | ICD-10-CM | POA: Diagnosis present

## 2011-02-26 DIAGNOSIS — F329 Major depressive disorder, single episode, unspecified: Secondary | ICD-10-CM | POA: Diagnosis present

## 2011-02-26 DIAGNOSIS — G4733 Obstructive sleep apnea (adult) (pediatric): Secondary | ICD-10-CM | POA: Diagnosis present

## 2011-02-26 DIAGNOSIS — M1610 Unilateral primary osteoarthritis, unspecified hip: Principal | ICD-10-CM | POA: Diagnosis present

## 2011-02-26 DIAGNOSIS — I10 Essential (primary) hypertension: Secondary | ICD-10-CM | POA: Diagnosis present

## 2011-02-26 DIAGNOSIS — F3289 Other specified depressive episodes: Secondary | ICD-10-CM | POA: Diagnosis present

## 2011-02-26 DIAGNOSIS — I509 Heart failure, unspecified: Secondary | ICD-10-CM | POA: Diagnosis present

## 2011-02-26 LAB — TYPE AND SCREEN
ABO/RH(D): O POS
Antibody Screen: NEGATIVE

## 2011-02-26 IMAGING — CR DG PORTABLE PELVIS
1 series · 1 of 1 positions shown · non-contrast
Comparison: Right hip x-rays [DATE].  Lateral right hip x-ray
obtained concurrently.

CLINICAL DATA: Right hip arthroplasty.

PORTABLE PELVIS AP VIEW [DATE]/[PHONE_NUMBER] hours:

[AP]
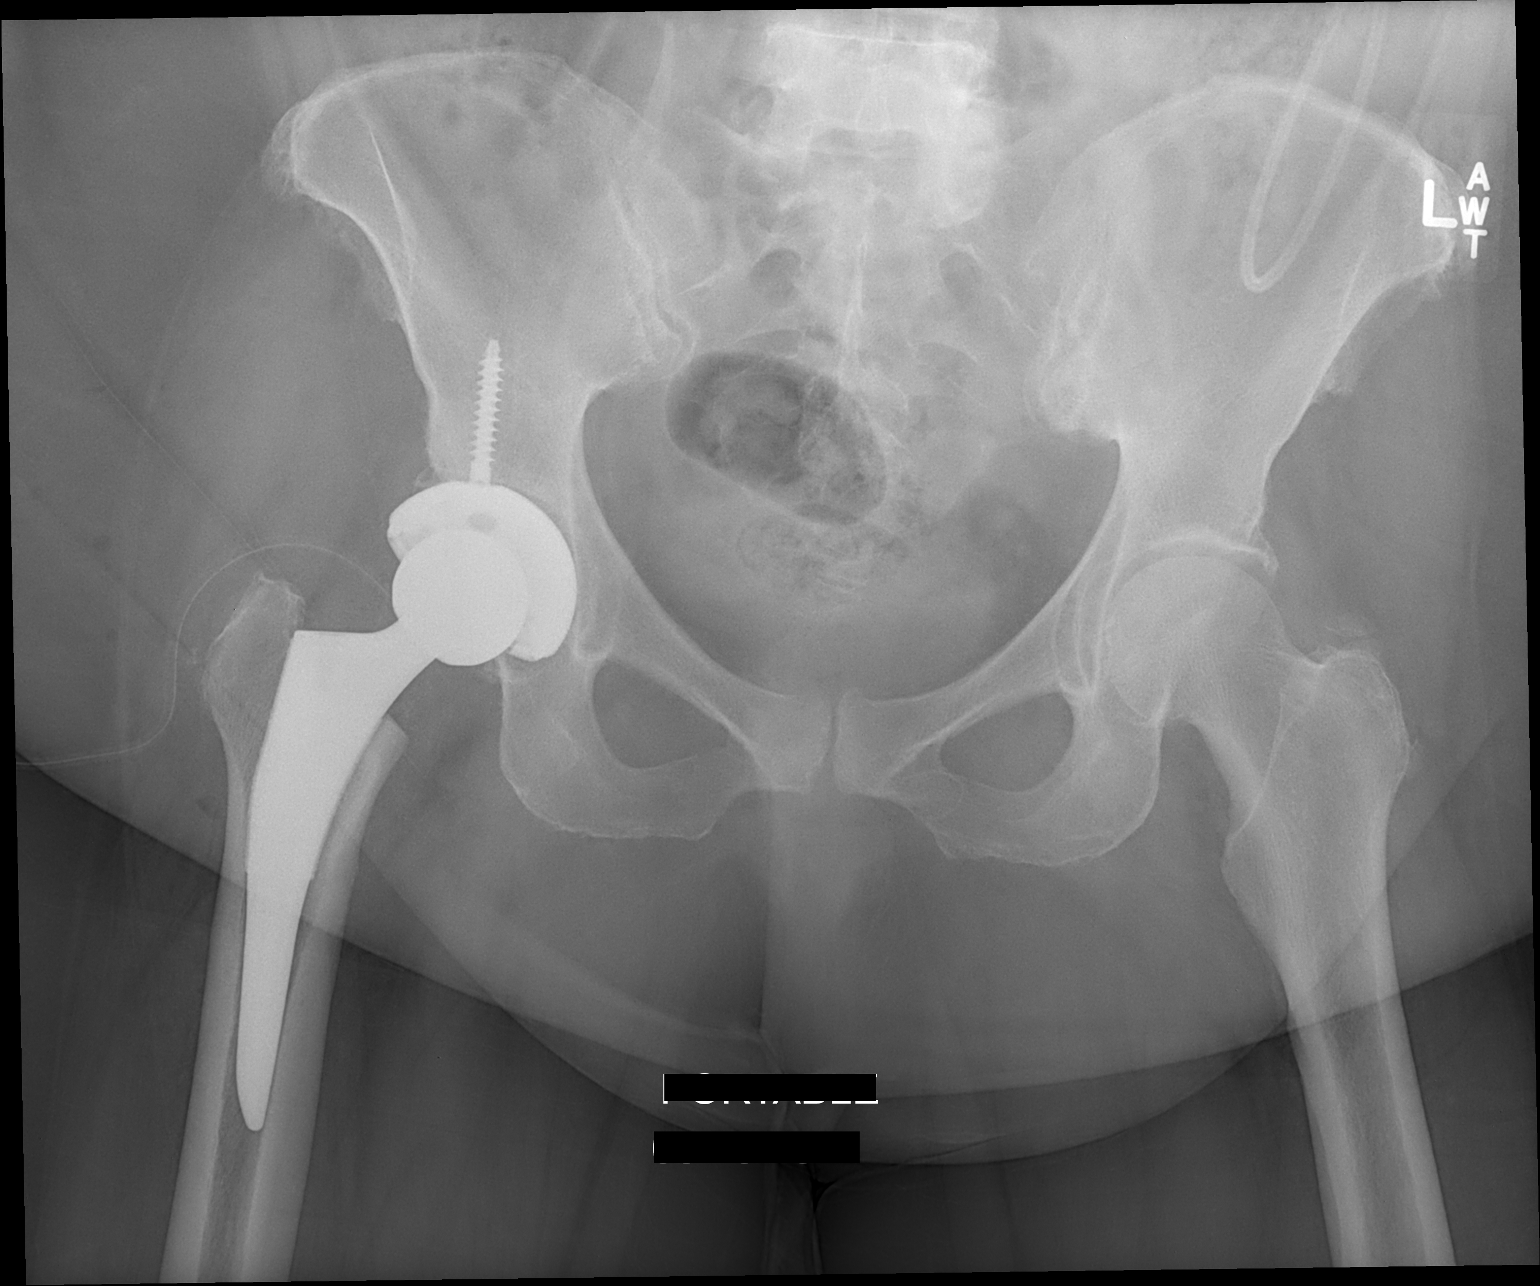

[1 of 1 positions shown; findings below may reference images not displayed]

FINDINGS: Anatomic alignment post right hip arthroplasty.  No acute
complicating features.  Surgical drain in place.  Note again made
of mild joint space narrowing in the contralateral left hip.
IMPRESSION: Anatomic alignment post right hip arthroplasty without acute
complicating features.

## 2011-02-26 IMAGING — CR DG HIP 1V PORT*R*
1 series · 1 of 1 positions shown · non-contrast
Comparison: Right hip x-rays [DATE].  AP pelvis x-ray obtained
concurrently.

CLINICAL DATA: Right hip arthroplasty.

PORTABLE RIGHT HIP - 1 VIEW [DATE]:

[lateral l5-s1]
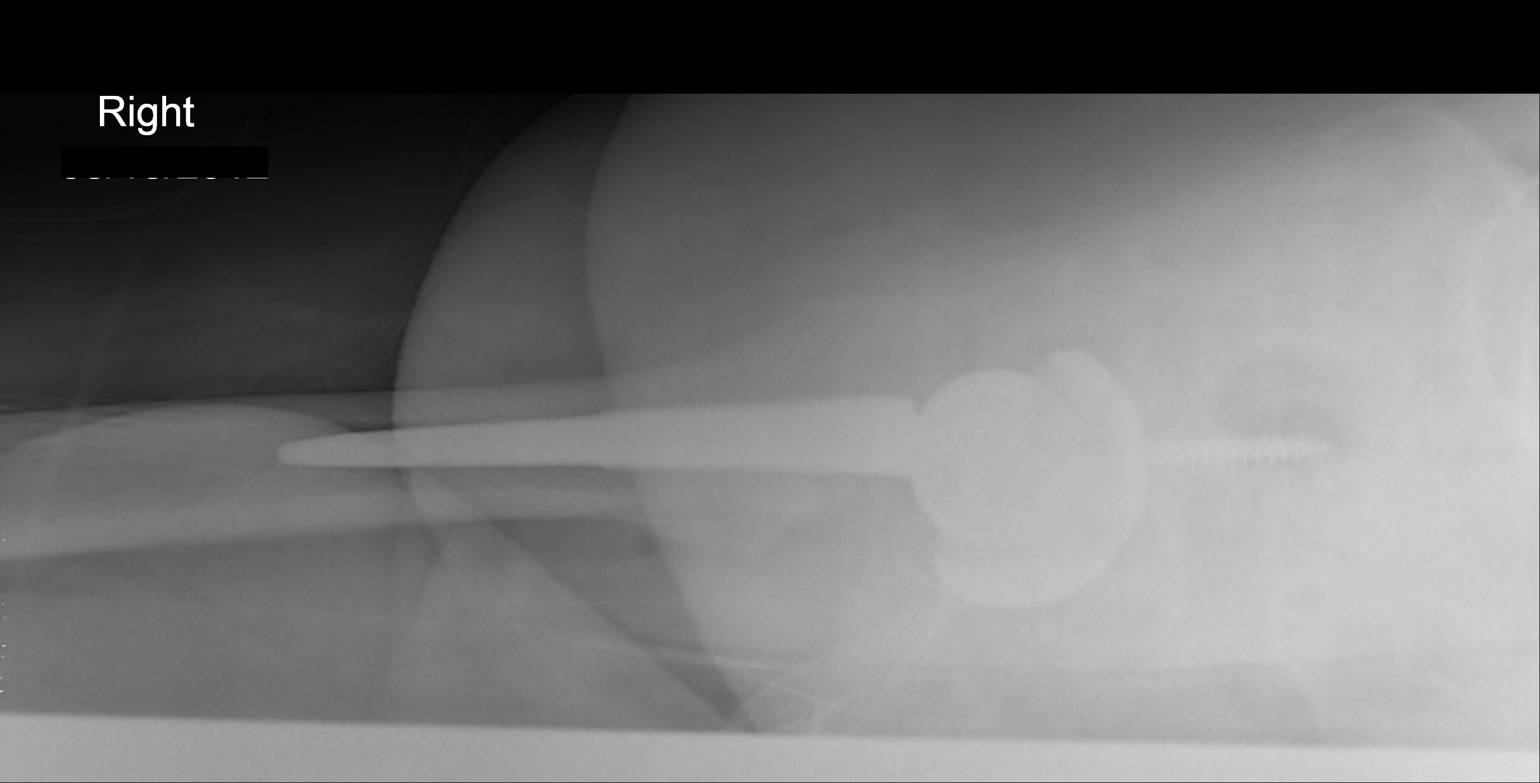

[1 of 1 positions shown; findings below may reference images not displayed]

FINDINGS: Lateral image demonstrates anatomic alignment post right
hip arthroplasty.  No acute complicating features.
IMPRESSION: Anatomic alignment post right hip arthroplasty without acute
complicating features.

## 2011-02-27 LAB — BASIC METABOLIC PANEL
BUN: 43 mg/dL — ABNORMAL HIGH (ref 6–23)
BUN: 42 mg/dL — ABNORMAL HIGH (ref 6–23)
CO2: 28 mEq/L (ref 19–32)
CO2: 30 mEq/L (ref 19–32)
Calcium: 8.8 mg/dL (ref 8.4–10.5)
Calcium: 8.7 mg/dL (ref 8.4–10.5)
Chloride: 102 mEq/L (ref 96–112)
Chloride: 98 mEq/L (ref 96–112)
Creatinine, Ser: 2.22 mg/dL — ABNORMAL HIGH (ref 0.50–1.10)
Creatinine, Ser: 2.41 mg/dL — ABNORMAL HIGH (ref 0.50–1.10)
GFR calc Af Amer: 27 mL/min — ABNORMAL LOW (ref >60)
GFR calc Af Amer: 25 mL/min — ABNORMAL LOW (ref >60)
GFR calc non Af Amer: 22 mL/min — ABNORMAL LOW (ref >60)
GFR calc non Af Amer: 20 mL/min — ABNORMAL LOW (ref >60)
Glucose, Bld: 109 mg/dL — ABNORMAL HIGH (ref 70–99)
Glucose, Bld: 129 mg/dL — ABNORMAL HIGH (ref 70–99)
Potassium: 3.6 mEq/L (ref 3.5–5.1)
Potassium: 3.5 mEq/L (ref 3.5–5.1)
Sodium: 138 mEq/L (ref 135–145)
Sodium: 135 mEq/L (ref 135–145)

## 2011-02-27 LAB — CBC
HCT: 31.7 % — ABNORMAL LOW (ref 36.0–46.0)
Hemoglobin: 10.2 g/dL — ABNORMAL LOW (ref 12.0–15.0)
MCH: 26.6 pg (ref 26.0–34.0)
MCHC: 32.2 g/dL (ref 30.0–36.0)
MCV: 82.8 fL (ref 78.0–100.0)
Platelets: 263 10*3/uL (ref 150–400)
RBC: 3.83 MIL/uL — ABNORMAL LOW (ref 3.87–5.11)
RDW: 16.9 % — ABNORMAL HIGH (ref 11.5–15.5)
WBC: 11.9 10*3/uL — ABNORMAL HIGH (ref 4.0–10.5)

## 2011-02-28 LAB — CBC
HCT: 29.1 % — ABNORMAL LOW (ref 36.0–46.0)
Hemoglobin: 9.3 g/dL — ABNORMAL LOW (ref 12.0–15.0)
MCH: 26.5 pg (ref 26.0–34.0)
MCHC: 32 g/dL (ref 30.0–36.0)
MCV: 82.9 fL (ref 78.0–100.0)
Platelets: 212 10*3/uL (ref 150–400)
RBC: 3.51 MIL/uL — ABNORMAL LOW (ref 3.87–5.11)
RDW: 17.1 % — ABNORMAL HIGH (ref 11.5–15.5)
WBC: 11.7 10*3/uL — ABNORMAL HIGH (ref 4.0–10.5)

## 2011-02-28 LAB — BASIC METABOLIC PANEL
BUN: 42 mg/dL — ABNORMAL HIGH (ref 6–23)
CO2: 28 mEq/L (ref 19–32)
Calcium: 8.4 mg/dL (ref 8.4–10.5)
Chloride: 103 mEq/L (ref 96–112)
Creatinine, Ser: 2.26 mg/dL — ABNORMAL HIGH (ref 0.50–1.10)
GFR calc Af Amer: 27 mL/min — ABNORMAL LOW (ref >60)
GFR calc non Af Amer: 22 mL/min — ABNORMAL LOW (ref >60)
Glucose, Bld: 109 mg/dL — ABNORMAL HIGH (ref 70–99)
Potassium: 3.7 mEq/L (ref 3.5–5.1)
Sodium: 137 mEq/L (ref 135–145)

## 2011-02-28 NOTE — Op Note (Signed)
[name], [name]                 ACCOUNT NO.:  [id number]  MEDICAL RECORD NO.:  [id number]  LOCATION:  [id number]                         FACILITY:  [facility]  PHYSICIAN:  Pietro Cassis. Alvan Dame, M.D.  DATE OF BIRTH:  21-Oct-1945  DATE OF PROCEDURE:  02/26/2011 DATE OF DISCHARGE:                              OPERATIVE REPORT   PREOPERATIVE DIAGNOSIS:  Right hip osteoarthritis.  POSTOPERATIVE DIAGNOSIS:  Right hip osteoarthritis.  PROCEDURE:  Right total hip replacement utilizing DePuy hip system size 50 Pinnacle shell, 32 +4 neutral AltrX liner, size 2 high Tri-Lock stem with a 32 +5 Delta ceramic ball.  SURGEON:  Pietro Cassis. Alvan Dame, MD  ASSISTANT:  Jane Orleans, PA-C  ANESTHESIA:  General.  SPECIMENS:  None.  COMPLICATIONS:  None.  BLOOD LOSS:  About 350 cc.  DRAINS:  One medium Hemovac.  INDICATIONS OF PROCEDURE:  [name] is a 66 year old female who presented to the office for right hip pain.  Radiographs revealed advanced right hip osteoarthritis.  After reviewing with her failing conservative measures and medications, she wished at this point to proceed with more definitive measures and risks of infection, particularly related to her size, DVT, component failure, need for revision surgery, and dislocation, she wished at this point to proceed with hip replacement surgery.  Consent was obtained for benefit of pain relief.  PROCEDURE IN DETAIL:  The patient was brought to operative theater. Once adequate anesthesia, preoperative antibiotics, Ancef 2 g administered, she was positioned to the left lateral decubitus position with the right-side up.  The right lower extremity was then prepped and draped in sterile fashion.  Time-out was performed identifying the patient, planned procedure, and extremity.  An incision was then made based off the palpable lateral troch for posterior approach.  Sharp dissection was carried to the iliotibial band and gluteal fascia.  This was then  incised for posterior approach.  The short external rotators were identified and taken down separate from posterior capsule.  An L capsulotomy was made preserving the capsule for later anatomic repair as well as to protect sciatic nerve from retractors.  The hip was dislocated.  Neck osteotomy made into the trochanteric fossa, advanced hip arthritis noted.  Femoral retractors were placed and the proximal femur was opened with a drill, hand reamed once, and then irrigated to try to prevent fat emboli.  Starting with a 0 broach, I set anteversion at about 20 degrees.  I broached up to a size 2 with good medial and lateral metaphyseal fit.  No evidence of any torsional movement of the component.  Trial broach was removed and a sponge was packed in the femur.  At this point, attention was now directed to the acetabulum.  Acetabular retractors were placed.  Labrum and foveal tissue debrided.  I began reaming with a 43 reamer and reamed up to a 49 reamer.  We had good bone bed preparation.  The final 50 Pinnacle cup was chosen and impacted with a curve impactor to set about 35-40 degrees of abduction.  A single cancellous screw was placed into the ileum and a hole eliminator was placed and a final 32 +4 AltrX liner impacted into place.  At this point, attention was now directed to a trial reduction.  The trial broach size 2 was impacted in place.  High offset neck was chosen based on her preoperative radiographs and evaluation of offset.  I used a 32 +5 ball based on the position of the stem.  With this, I felt leg lengths were equal as compared to the down leg.  There was only about a millimeter shuck in extension and there was no evidence of impingement with extension, external rotation, flexion, internal rotation.  Given these findings, the trial components were removed.  The final 2 high Tri-Lock stem was opened.  After final irrigation of the canal, the final 2 component was then  impacted, it sat at the level of broach.  Based on this and the trial reduction, a 32 +5 Delta ceramic ball was chosen and then impacted onto clean and dry trunnion.  I used a +5 ball to help a little bit more offset as well as to maintain length.  At this point, the hip was irrigated as it had been throughout the case. I reapproximated the posterior capsule, superior leaflet using #1 Vicryl.  Placed a medium Hemovac drain deep.  The iliotibial band and gluteal fascia were then reapproximated over top of the Hemovac drain using #1 Vicryl.  The remaining of the wound was closed with 2-0 Vicryl and running 4-0 Monocryl.  The hip was cleaned, dried, and dressed sterilely using Dermabond and Aquacel dressing.  Drain site dressed separately.  She was then brought to the recovery room in stable condition tolerating the procedure well.     Pietro Cassis Alvan Dame, M.D.     MDO/MEDQ  D:  02/26/2011  T:  02/27/2011  Job:  [id number]  Electronically Signed by Paralee Cancel M.D. on 02/28/2011 01:53:06 PM

## 2011-03-05 NOTE — Discharge Summary (Signed)
[name], [name]                 ACCOUNT NO.:  [id number]  MEDICAL RECORD NO.:  [id number]  LOCATION:  [id number]                         FACILITY[facility]  [facility]  PHYSICIAN:  Pietro Cassis. Alvan Dame, M.D.  DATE OF BIRTH:  19-Oct-1945  DATE OF ADMISSION:  02/26/2011 DATE OF DISCHARGE:  02/28/2011                              DISCHARGE SUMMARY   PROCEDURE:  Right total hip arthroplasty.  ADMITTING DIAGNOSIS:  Right hip osteoarthritis.  DISCHARGE DIAGNOSES: 1. Status post right total hip arthroplasty. 2. Kidney disease. 3. Hypertension. 4. Gout. 5. Depression.  HISTORY OF PRESENT ILLNESS:  The patient is a 65 year old lady with a history of osteoarthritis, right hip.  The patient has tried several conservative treatments all of which have failed.  X-rays from the clinic did show arthritic changes in the right hip.  Various options were discussed with the patient.  The patient wishes to proceed with surgery.  Risks, benefits, and expectations of procedure were discussed with the patient.  The patient consents to risks, benefits, and expectations and wishes to proceed with surgery.  HOSPITAL COURSE:  The patient underwent the above-stated procedure on February 26, 2011.  The patient tolerated the procedure well, was brought to the recovery room in good condition and subsequently to the floor.  Postop day 1 on February 27, 2011, the patient was doing well, no incidents.  The patient states that her pain is well-controlled.  H and H is 10.2/31.7.  The patient is distally neurovascular intact.  Her dressings look good.  They are clean, dry, and intact.  The patient has started with physical therapy.  The patient's kidney function decreased. The patient is changed from Xarelto to Lovenox on the hospital.  Postop day 2, February 28, 2011, the patient doing really well, no incidences, pain is well-controlled, on oral pain medications and the patient feels as if she wants to go home.  She is afebrile, vital  signs are stable.  H and H is 9.3/29.1.  I firmly suggest discontinue colchicine until kidney function increases.  The patient was advised to do this and follow up with Dr. Jimmy Footman in 1-2 weeks.  The patient is distally neurovascular intact.  Dressing looked good, and the patient is given instructions on the dressings.  The patient will have physical therapy today prior to going home.  The patient is felt to be doing well enough to be discharged home with home health.  DISCHARGE CONDITION:  Good.  DISCHARGE INSTRUCTIONS:  The patient will be discharged home on February 28, 2011, with home health.  The patient is to be weightbearing as tolerated.  The patient is to maintain her surgical dressing for about 8 days.  At that time, she can replace it with gauze and tape.  The patient is to keep the area dry and clean until followup.  The patient will follow up Dr. Alvan Dame at the [facility] in 2 weeks. Again, the patient also is to follow up with Dr. Jimmy Footman, within 1-2 weeks to monitor her kidney function.  DISCHARGE MEDICATIONS: 1. Enteric-coated aspirin 325 mg 1 p.o. b.i.d. x4 weeks. 2. Colace 100 mg 1 p.o. b.i.d. p.r.n. constipation. 3. Iron sulfate 325 mg  1 p.o. t.i.d. x2-3 weeks. 4. Norco 7.5/325, one to two p.o. q.4-6 hours p.r.n. pain. 5. MiraLax 17 g, 1 p.o. q.d., constipation. 6. Robaxin 500 mg 1 p.o. q.6 hours p.r.n. muscle spasms. 7. Allopurinol 100 mg 3 tablets p.o. q.a.m. 8. Diltiazem 300 mg 1 p.o. q.a.m. 9. Lasix 80 mg 2 tablets in the morning and 1 tablet in the evening. 10.Gabapentin 300 mg 2 p.o. t.i.d. 11.Hectorol 1 p.o. q.a.m., 0.5 mcg. 12.Metoprolol 100 mg 1 p.o. q.a.m. 13.Micardis 1 p.o. q.h.s. 14.Potassium chloride 20 mEq 1 p.o. q.a.m. 15.Sertraline 50 mg 1 p.o. q.a.m. 16.Sotalol 80 mg 1 p.o. b.i.d.    ______________________________ Danae Orleans, PA   ______________________________ Pietro Cassis. Alvan Dame, M.D.    MB/MEDQ  D:  02/28/2011  T:   03/01/2011  Job:  [id number]  cc:   Rosalita Chessman, DO [place], [place] [address]  Dr. Jimmy Footman  Dr. Radford Pax  Electronically Signed by Danae Orleans PA on 03/05/2011 09:56:19 AM Electronically Signed by Paralee Cancel M.D. on 03/05/2011 05:35:18 PM

## 2011-03-06 ENCOUNTER — Ambulatory Visit: Payer: 59 | Admitting: *Deleted

## 2011-03-09 NOTE — H&P (Signed)
[name], [name]                 ACCOUNT NO.:  [id number]  MEDICAL RECORD NO.:  [id number]  LOCATION:  XRAY                         FACILITY:  [facility]  PHYSICIAN:  Pietro Cassis. Alvan Dame, M.D.  DATE OF BIRTH:  1945-08-10  DATE OF ADMISSION:  02/19/2011 DATE OF DISCHARGE:  02/19/2011                             HISTORY & PHYSICAL   ATTENDING PHYSICIAN:  Dr. Paralee Cancel  DATE OF SURGERY:  August 2012.  ADMITTING DIAGNOSIS:  Right hip osteoarthritis.  HISTORY OF PRESENT ILLNESS:  This is a 65 year old lady with a history of osteoarthritis, right hip with failure of conservative treatment. Patient is now for total hip arthroplasty on the right.  Surgery, risks, benefits and aftercare were discussed with the patient.  Questions invited and answered.  Note, she is not a candidate for trans-cinnamic acid and will not receive that preop.  She will be going home after surgery.  She is given her home medicines of aspirin, Robaxin, iron, MiraLAX and Colace to take postoperatively.  Note that her medical doctor is Dr. Garnet Koyanagi,  Dr. Jimmy Footman is her kidney doctor, and Dr. Radford Pax is her cardiologist.  PAST MEDICAL HISTORY:  Drug allergies none.  CURRENT MEDICATIONS:  Klor-Con, metoprolol, diltiazem, Colcrys, Micardis, allopurinol, sotalol, Zoloft, furosemide, Hectorol,  Tylenol with Codeine  and aspirin.  She will bring the dosages to the hospital with her.  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  MEDICAL ILLNESSES:  Include kidney disease, hypertension, gout, depression.  FAMILY HISTORY:  Positive for Hodgkin's disease.  SOCIAL HISTORY:  Patient is married.  She lives at home.  She does not smoke and does not drink.  She will be going home after surgery.  REVIEW OF SYSTEMS:  NERVOUS SYSTEM: Positive for stroke in 2010 with left-sided weakness initially and is resolved now.  PULMONARY:  Positive for  sleep apnea and she uses a CPAP machine and will need to use that postoperatively.  CARDIOVASCULAR:  Positive for hypertension and history of atrial fibrillation with previous cardioversion.  GI:  Positive for cholecystectomy and also Lap-band surgery.  Negative for ulcers.  GU: Positive for occasional urinary incontinence.  MUSCULOSKELETAL: Positive as in HPI.  PHYSICAL EXAM:  VITAL SIGNS:  BP 138/92, respirations 16, pulse 72 and regular. GENERAL APPEARANCE:  This is an obese lady, in no acute distress. HEENT:  Head normocephalic.  Nose patent.  Ears patent.  Pupils equal, round and reactive to light.  Throat without injection. NECK:  Supple without adenopathy.  Carotids 2+ without bruit. CHEST:  Clear to auscultation.  No rales or rhonchi.  Respirations 16. HEART:  Regular rate and rhythm at 72 beats per minute without murmur. ABDOMEN:  Soft.  Active bowel sounds.  No masses or organomegaly. NEUROLOGIC:  Patient alert and oriented to time, place, and person. Cranial nerves II through XII grossly intact. EXTREMITIES:  Shows the right hip with decreased range of motion with pain throughout all range of motion.  Neurovascular status intact.  IMPRESSION:  Right hip osteoarthritis.  PLAN:  Total hip arthroplasty, right hip.     [name]. Chabon, P.A.   ______________________________  Pietro Cassis Alvan Dame, M.D.    SJC/MEDQ  D:  02/21/2011  T:  02/22/2011  Job:  [id number]  Electronically Signed by Paralee Cancel M.D. on 03/09/2011 07:56:29 AM

## 2011-03-31 ENCOUNTER — Inpatient Hospital Stay (HOSPITAL_COMMUNITY)
Admission: EM | Admit: 2011-03-31 | Discharge: 2011-04-05 | DRG: 468 | Disposition: A | Discharge status: Skilled Nursing Facility | Payer: 59 | Attending: Orthopedic Surgery | Admitting: Orthopedic Surgery

## 2011-03-31 ENCOUNTER — Emergency Department (HOSPITAL_COMMUNITY): Payer: 59

## 2011-03-31 DIAGNOSIS — Y999 Unspecified external cause status: Secondary | ICD-10-CM

## 2011-03-31 DIAGNOSIS — W101XXA Fall (on)(from) sidewalk curb, initial encounter: Secondary | ICD-10-CM | POA: Diagnosis present

## 2011-03-31 DIAGNOSIS — Z8673 Personal history of transient ischemic attack (TIA), and cerebral infarction without residual deficits: Secondary | ICD-10-CM

## 2011-03-31 DIAGNOSIS — E213 Hyperparathyroidism, unspecified: Secondary | ICD-10-CM | POA: Diagnosis present

## 2011-03-31 DIAGNOSIS — J45909 Unspecified asthma, uncomplicated: Secondary | ICD-10-CM | POA: Diagnosis present

## 2011-03-31 DIAGNOSIS — R609 Edema, unspecified: Secondary | ICD-10-CM | POA: Diagnosis not present

## 2011-03-31 DIAGNOSIS — Z9889 Other specified postprocedural states: Secondary | ICD-10-CM

## 2011-03-31 DIAGNOSIS — I4891 Unspecified atrial fibrillation: Secondary | ICD-10-CM | POA: Diagnosis present

## 2011-03-31 DIAGNOSIS — E669 Obesity, unspecified: Secondary | ICD-10-CM | POA: Diagnosis present

## 2011-03-31 DIAGNOSIS — Z79899 Other long term (current) drug therapy: Secondary | ICD-10-CM

## 2011-03-31 DIAGNOSIS — M109 Gout, unspecified: Secondary | ICD-10-CM | POA: Diagnosis present

## 2011-03-31 DIAGNOSIS — Y9289 Other specified places as the place of occurrence of the external cause: Secondary | ICD-10-CM

## 2011-03-31 DIAGNOSIS — Z96649 Presence of unspecified artificial hip joint: Secondary | ICD-10-CM

## 2011-03-31 DIAGNOSIS — I10 Essential (primary) hypertension: Secondary | ICD-10-CM | POA: Diagnosis present

## 2011-03-31 DIAGNOSIS — Z9884 Bariatric surgery status: Secondary | ICD-10-CM

## 2011-03-31 DIAGNOSIS — G4733 Obstructive sleep apnea (adult) (pediatric): Secondary | ICD-10-CM | POA: Diagnosis present

## 2011-03-31 DIAGNOSIS — S72309A Unspecified fracture of shaft of unspecified femur, initial encounter for closed fracture: Principal | ICD-10-CM | POA: Diagnosis present

## 2011-03-31 DIAGNOSIS — Y9389 Activity, other specified: Secondary | ICD-10-CM

## 2011-03-31 IMAGING — CR DG FEMUR 2+V*R*
2 series · 2 of 2 positions shown · non-contrast
Comparison: [DATE] pelvic radiograph

CLINICAL DATA: Right hip pain post fall

RIGHT FEMUR - 2 VIEW

[t femur distal ap right]
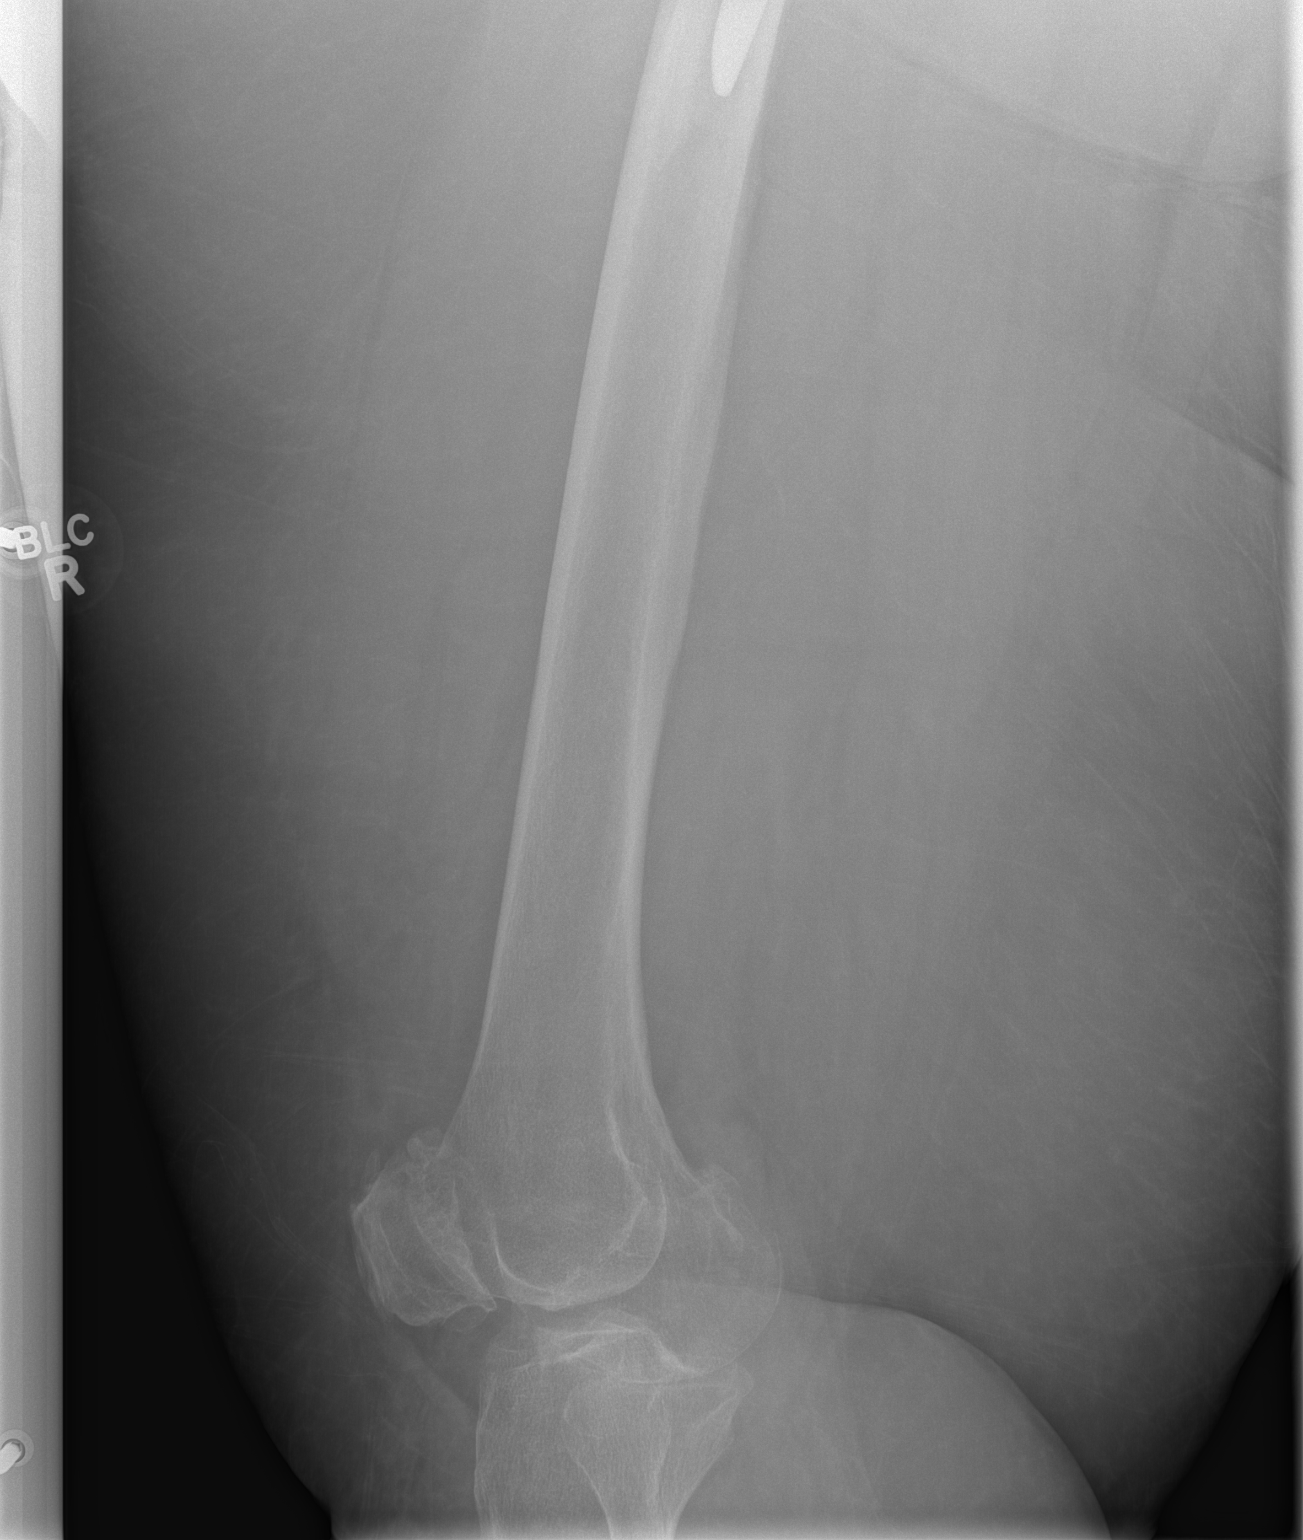

[x femur distal lat right]
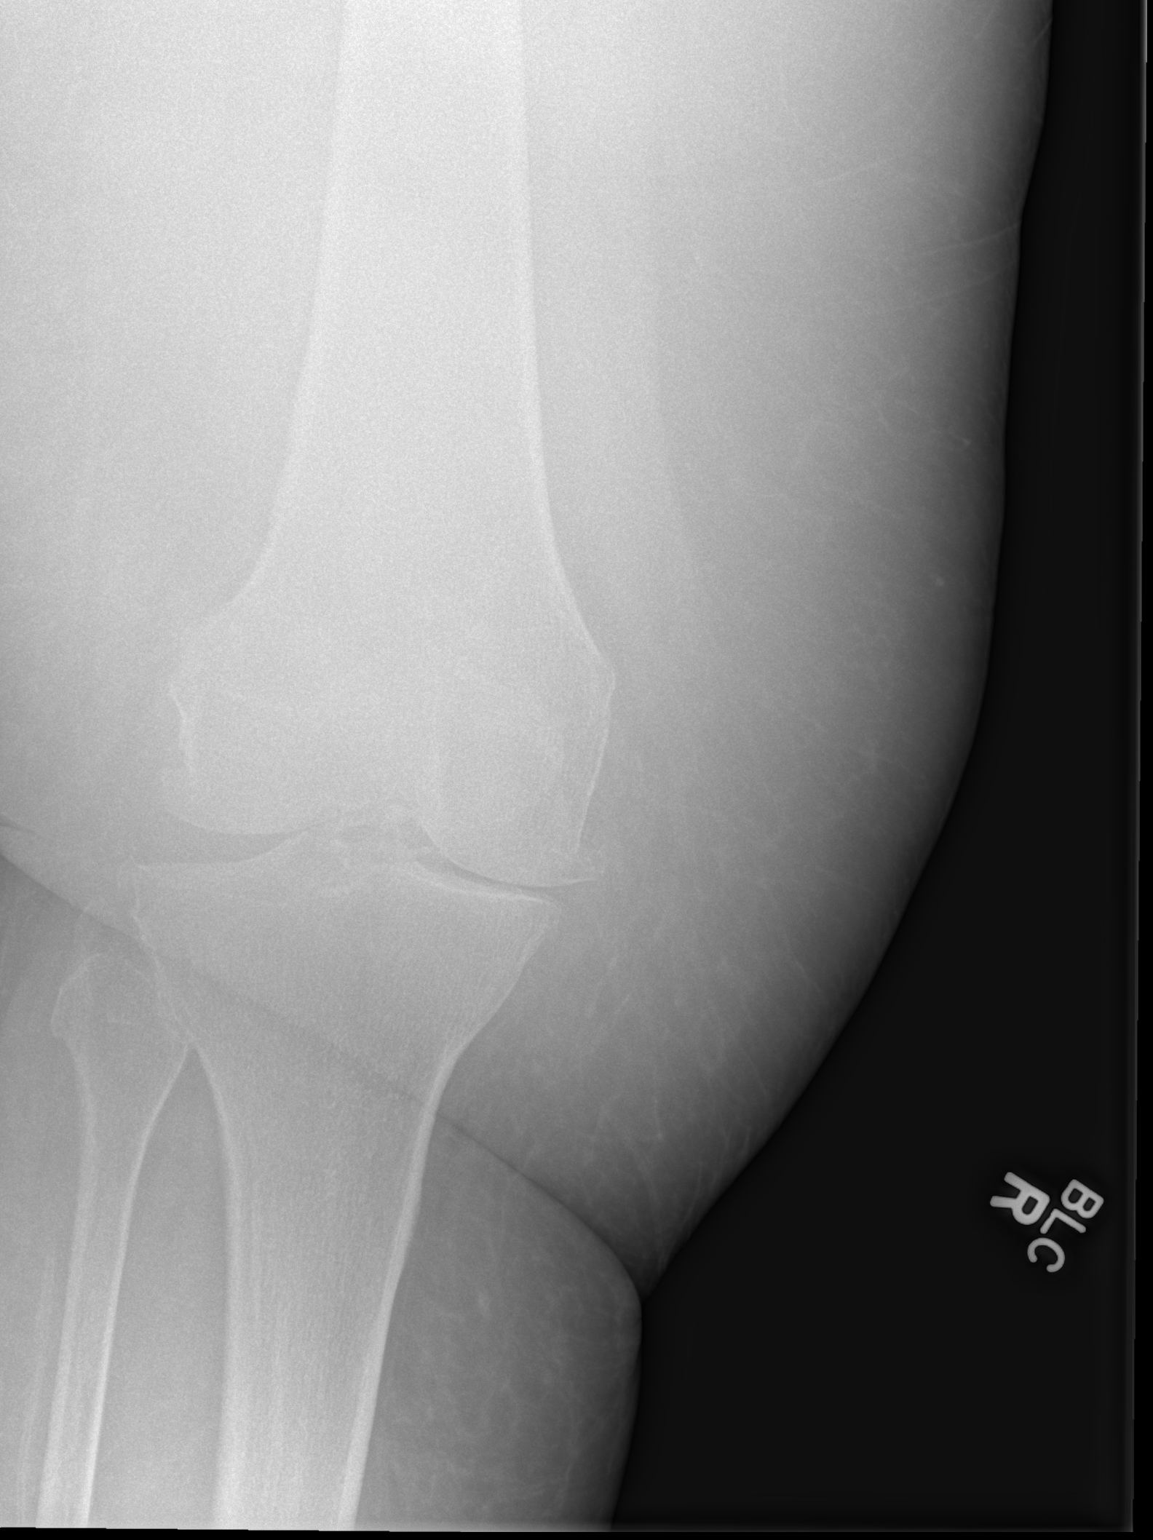

[2 of 2 positions shown; findings below may reference images not displayed]

FINDINGS: Osseous demineralization.
Tip of femoral component of right hip prosthesis is identified.
Displaced fracture of the proximal right femur is above extent of
this exam.
Severe osteoarthritic changes of the right knee with mild lateral
subluxation of tibia.
No additional femoral fracture identified.
IMPRESSION: Proximal right femoral oblique metadiaphyseal fracture, reported
separately, see report of the right hip radiographs.
Osseous demineralization.
Osteoarthritic changes right knee.

## 2011-04-01 LAB — URINE MICROSCOPIC-ADD ON

## 2011-04-01 LAB — CBC
HCT: 30.7 % — ABNORMAL LOW (ref 36.0–46.0)
Hemoglobin: 9.9 g/dL — ABNORMAL LOW (ref 12.0–15.0)
MCH: 26.5 pg (ref 26.0–34.0)
MCHC: 32.2 g/dL (ref 30.0–36.0)
MCV: 82.1 fL (ref 78.0–100.0)
Platelets: 304 10*3/uL (ref 150–400)
RBC: 3.74 MIL/uL — ABNORMAL LOW (ref 3.87–5.11)
RDW: 17 % — ABNORMAL HIGH (ref 11.5–15.5)
WBC: 12.3 10*3/uL — ABNORMAL HIGH (ref 4.0–10.5)

## 2011-04-01 LAB — URINALYSIS, ROUTINE W REFLEX MICROSCOPIC
Bilirubin Urine: NEGATIVE
Glucose, UA: NEGATIVE mg/dL
Ketones, ur: NEGATIVE mg/dL
Nitrite: NEGATIVE
Protein, ur: >300 mg/dL — AB
Specific Gravity, Urine: 1.017 (ref 1.005–1.030)
Urobilinogen, UA: 0.2 mg/dL (ref 0.0–1.0)
pH: 5.5 (ref 5.0–8.0)

## 2011-04-01 LAB — MRSA PCR SCREENING: MRSA by PCR: NEGATIVE

## 2011-04-02 ENCOUNTER — Inpatient Hospital Stay (HOSPITAL_COMMUNITY): Payer: 59

## 2011-04-02 LAB — CBC
HCT: 29.8 % — ABNORMAL LOW (ref 36.0–46.0)
Hemoglobin: 9.1 g/dL — ABNORMAL LOW (ref 12.0–15.0)
MCH: 25.8 pg — ABNORMAL LOW (ref 26.0–34.0)
MCHC: 30.5 g/dL (ref 30.0–36.0)
MCV: 84.4 fL (ref 78.0–100.0)
Platelets: 289 10*3/uL (ref 150–400)
RBC: 3.53 MIL/uL — ABNORMAL LOW (ref 3.87–5.11)
RDW: 17.2 % — ABNORMAL HIGH (ref 11.5–15.5)
WBC: 11.4 10*3/uL — ABNORMAL HIGH (ref 4.0–10.5)

## 2011-04-02 LAB — BASIC METABOLIC PANEL
BUN: 46 mg/dL — ABNORMAL HIGH (ref 6–23)
CO2: 31 mEq/L (ref 19–32)
Calcium: 8.7 mg/dL (ref 8.4–10.5)
Chloride: 103 mEq/L (ref 96–112)
Creatinine, Ser: 2.16 mg/dL — ABNORMAL HIGH (ref 0.50–1.10)
GFR calc Af Amer: 28 mL/min — ABNORMAL LOW (ref >60)
GFR calc non Af Amer: 23 mL/min — ABNORMAL LOW (ref >60)
Glucose, Bld: 113 mg/dL — ABNORMAL HIGH (ref 70–99)
Potassium: 3.8 mEq/L (ref 3.5–5.1)
Sodium: 143 mEq/L (ref 135–145)

## 2011-04-02 IMAGING — CR DG HIP 1V PORT*R*
1 series · 2 of 2 positions shown · non-contrast
Comparison: [DATE]

CLINICAL DATA: Postoperative radiograph.

PORTABLE RIGHT HIP - 1 VIEW

[Series 1: AP · right · 2 of 2 slices shown]
[im 1/2]
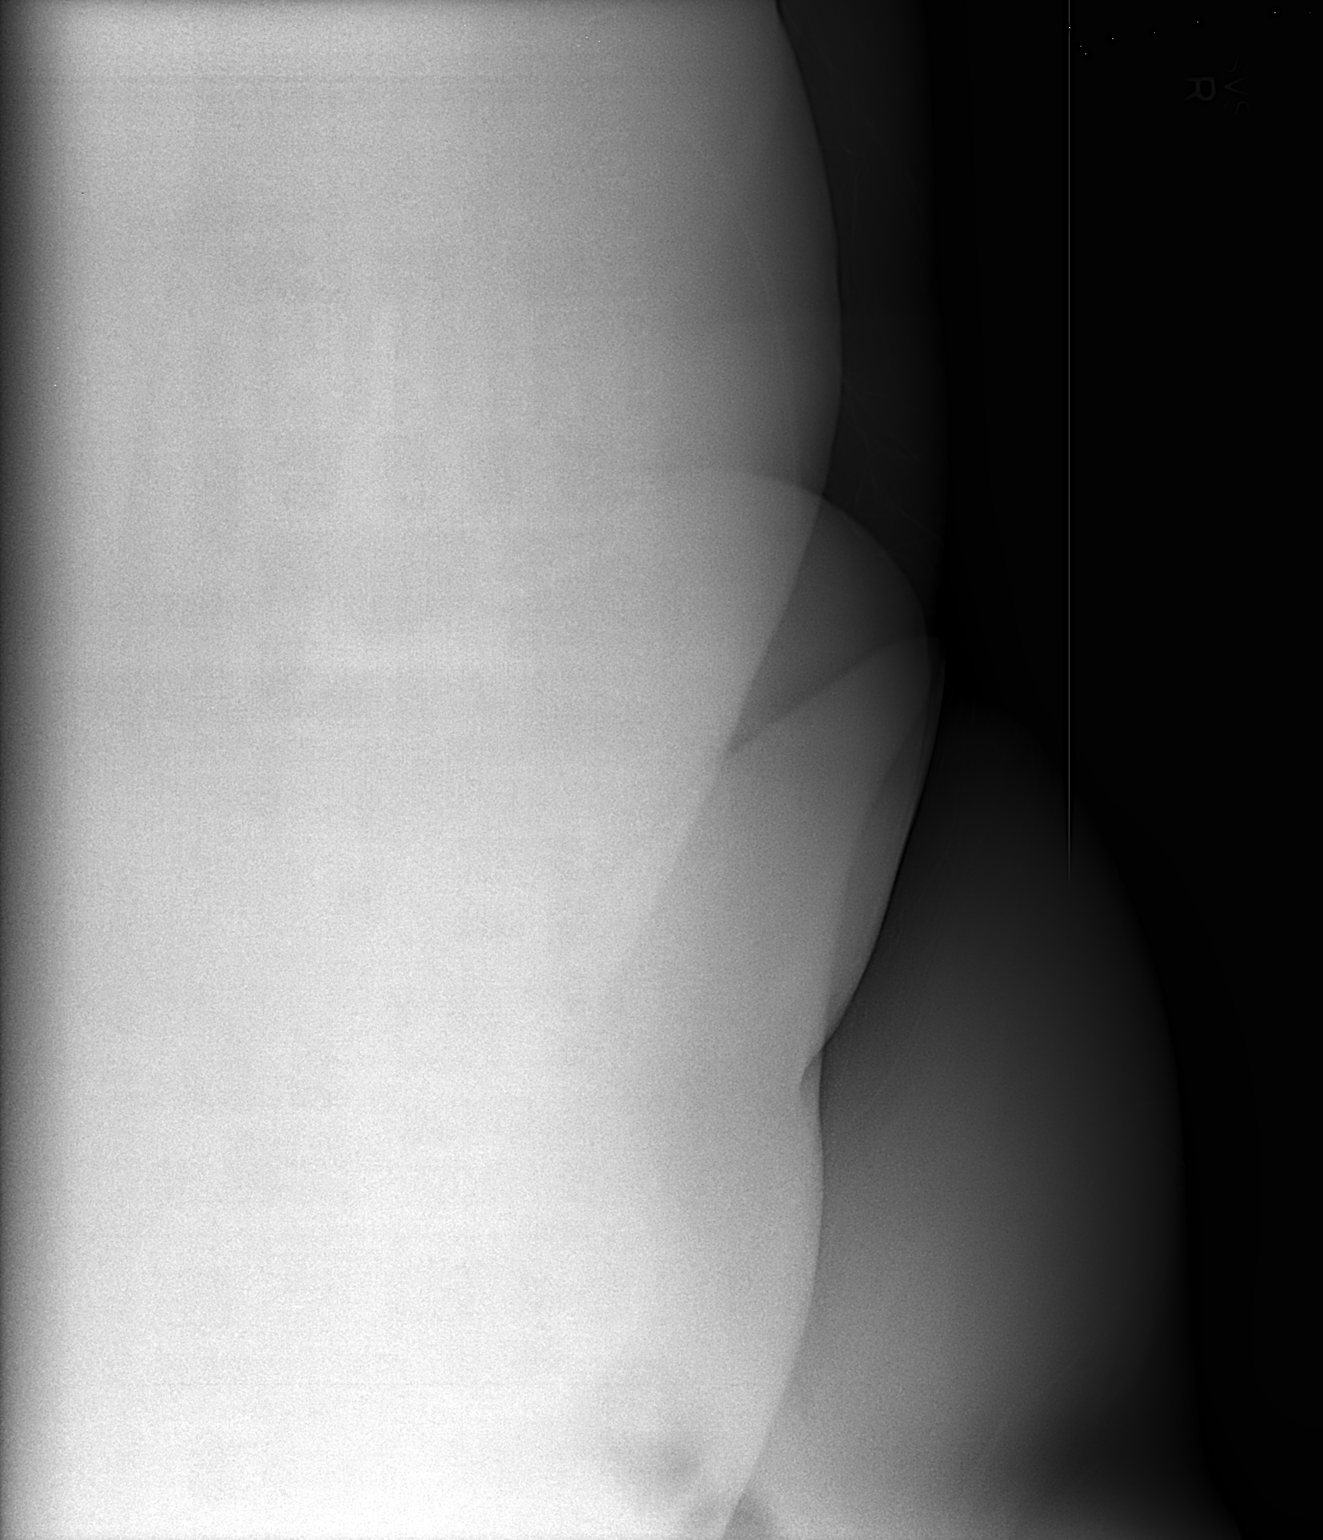
[im 2/2]
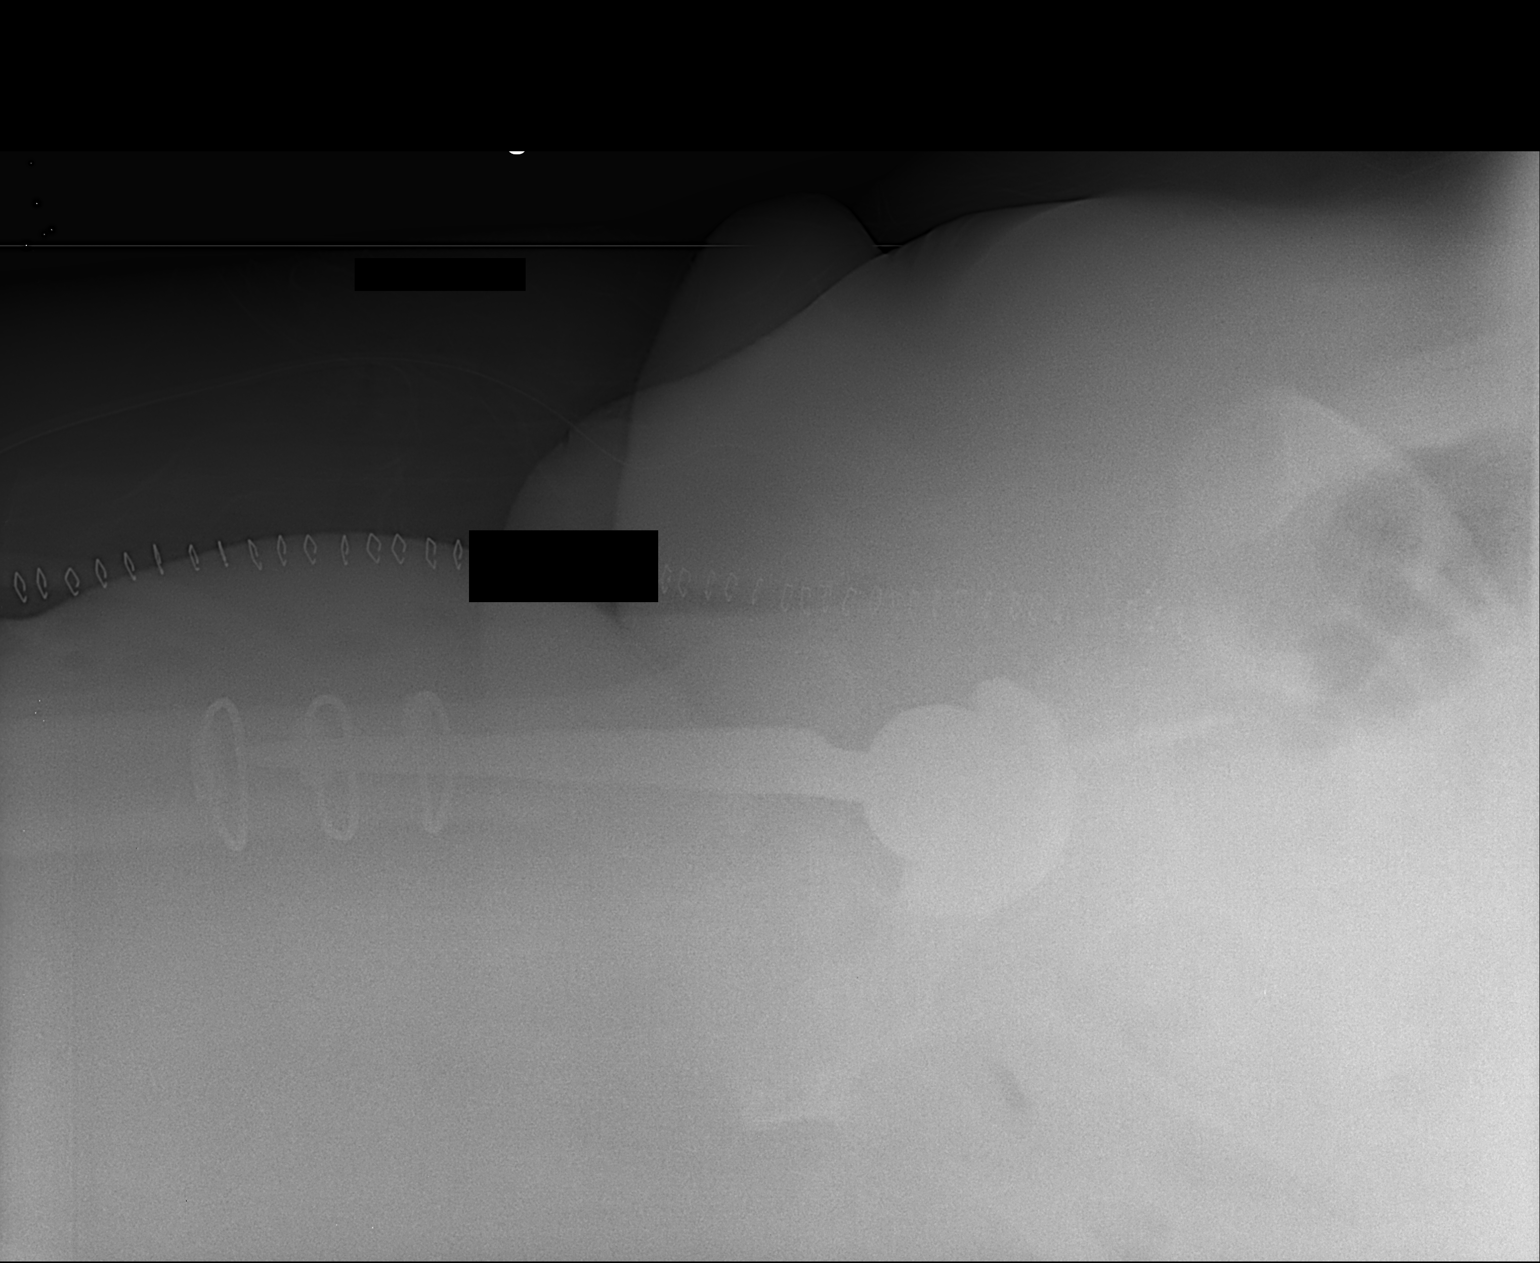

[2 of 2 positions shown; findings below may reference images not displayed]

FINDINGS: Interval placement of three cerclage wires, with improved
alignment of the fracture at the level of the distal portion of the
femoral component of the right hip prosthesis.  Skin staples
project along the surface.
IMPRESSION: Right hip arthroplasty and fixation of the femoral shaft fracture.
No evidence for hardware complication.

## 2011-04-02 IMAGING — CR DG PORTABLE PELVIS
1 series · 2 of 2 positions shown · non-contrast
Comparison: [DATE]

CLINICAL DATA: Postop, right hip surgery.

PORTABLE PELVIS

[Series 1: AP · U · 2 of 2 slices shown]
[im 1/2]
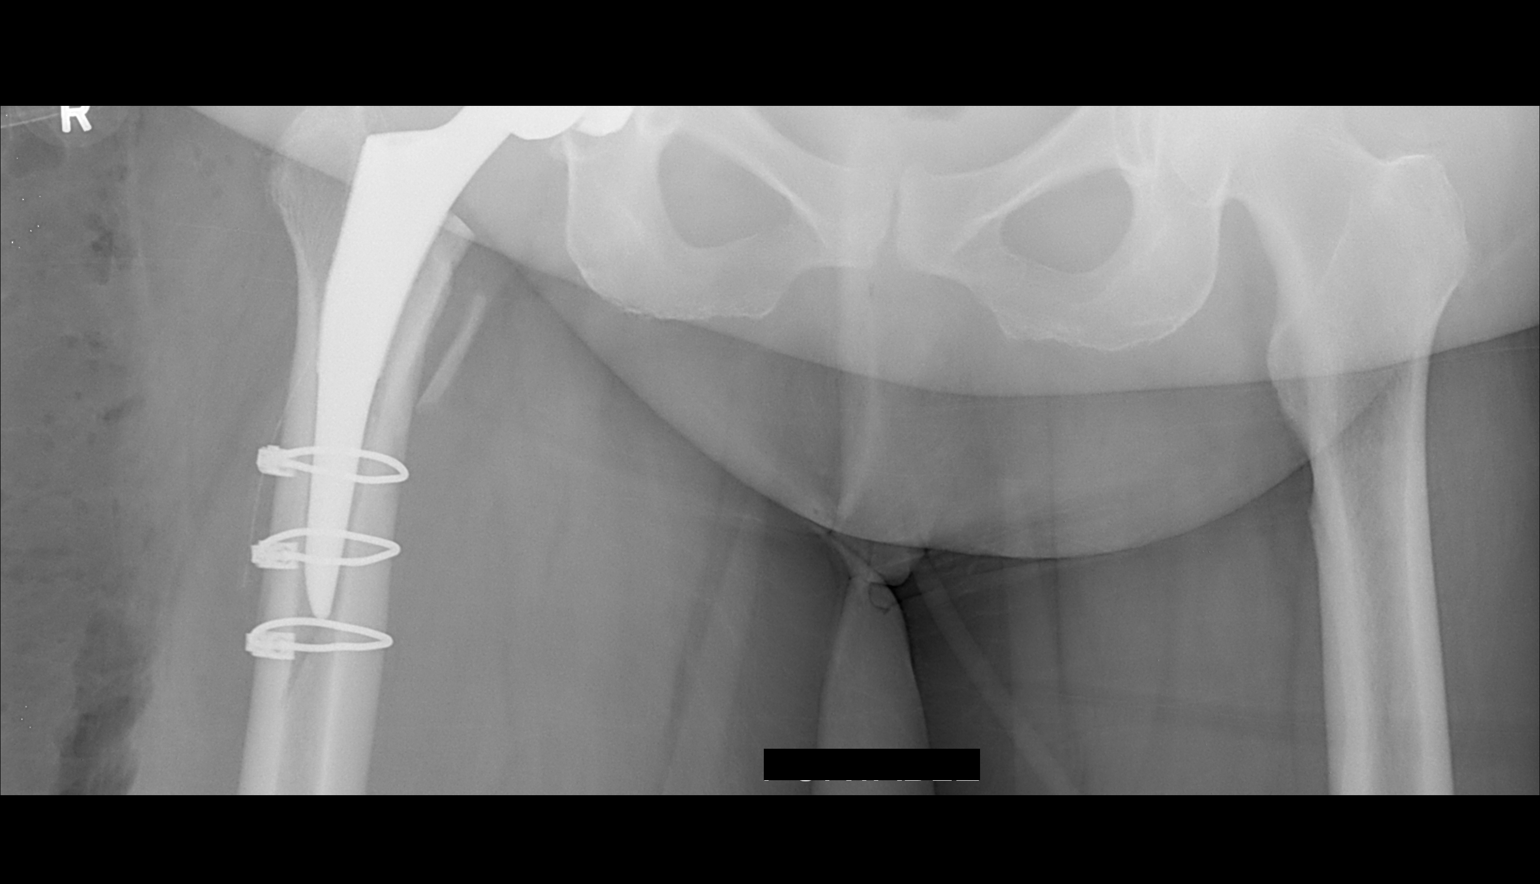
[im 2/2]
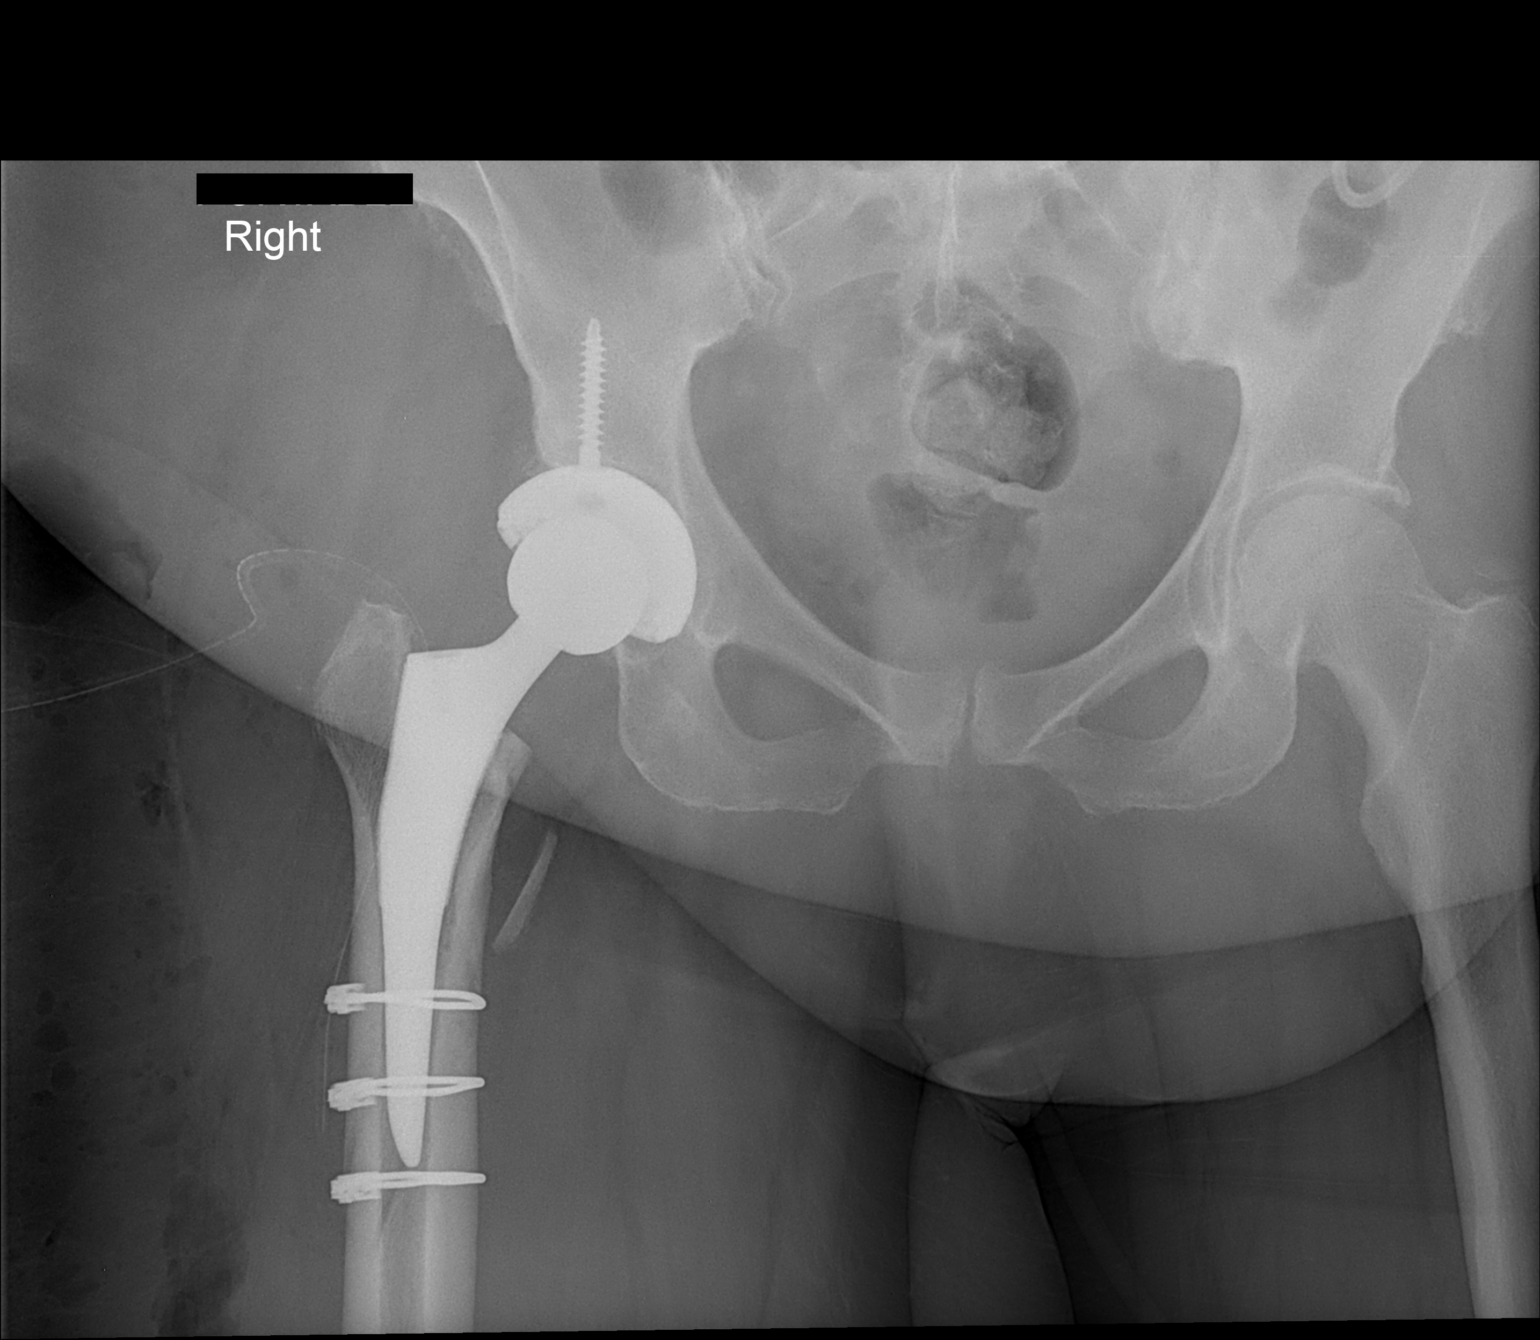

[2 of 2 positions shown; findings below may reference images not displayed]

FINDINGS: Right hip arthroplasty noted, now with 3 cerclage wires
along the femoral shaft.  Osseous fragment along the medial margin
at the level of the lesser trochanter.  Surgical drain in place.
Hardware in anatomic alignment without evidence for complication.
IMPRESSION: Status post right hip arthroplasty with hardware in anatomic
alignment.

## 2011-04-03 LAB — CBC
HCT: 24.5 % — ABNORMAL LOW (ref 36.0–46.0)
Hemoglobin: 7.9 g/dL — ABNORMAL LOW (ref 12.0–15.0)
MCH: 27 pg (ref 26.0–34.0)
MCHC: 32.2 g/dL (ref 30.0–36.0)
MCV: 83.6 fL (ref 78.0–100.0)
Platelets: 242 10*3/uL (ref 150–400)
RBC: 2.93 MIL/uL — ABNORMAL LOW (ref 3.87–5.11)
RDW: 16.3 % — ABNORMAL HIGH (ref 11.5–15.5)
WBC: 11.7 10*3/uL — ABNORMAL HIGH (ref 4.0–10.5)

## 2011-04-03 LAB — BASIC METABOLIC PANEL
BUN: 46 mg/dL — ABNORMAL HIGH (ref 6–23)
CO2: 33 mEq/L — ABNORMAL HIGH (ref 19–32)
Calcium: 8.5 mg/dL (ref 8.4–10.5)
Chloride: 106 mEq/L (ref 96–112)
Creatinine, Ser: 2.32 mg/dL — ABNORMAL HIGH (ref 0.50–1.10)
GFR calc Af Amer: 26 mL/min — ABNORMAL LOW (ref >60)
GFR calc non Af Amer: 21 mL/min — ABNORMAL LOW (ref >60)
Glucose, Bld: 115 mg/dL — ABNORMAL HIGH (ref 70–99)
Potassium: 4.3 mEq/L (ref 3.5–5.1)
Sodium: 144 mEq/L (ref 135–145)

## 2011-04-03 LAB — PREPARE RBC (CROSSMATCH)

## 2011-04-03 NOTE — H&P (Signed)
[name], [name]                 ACCOUNT NO.:  [id number]  MEDICAL RECORD NO.:  [id number]  LOCATION:  [id number]                         FACILITY:  [facility]  PHYSICIAN:  Tarri Glenn, M.D.  DATE OF BIRTH:  [birth year]  DATE OF ADMISSION:  03/31/2011 DATE OF DISCHARGE:                             HISTORY & PHYSICAL   CHIEF COMPLAINT:  "Pain in my right thigh.".  PRESENT ILLNESS:  This is a 65 year old lady who is post right total hip replacement arthroplasty by Dr. Paralee Cancel of [facility], fell today when she stepping up on a curb.  She had immediate pain into the right thigh and hip area.  She could not stand.  She was brought to the emergency room at [facility] by ambulance.  X-rays here in the emergency room show that she had a periprosthetic femoral fracture around the stem of the right femur.  This would require surgical intervention, so she was admitted for contemplation and clearance for surgery.  Dr. Shellia Carwin will be discussing the case with Dr. Alvan Dame and all probability he will be the main surgeon.  Examination of right lower extremity reveals to be somewhat externally rotated.  She had good sensation to the foot.  Dorsalis pulse was present, but thready.  PAST MEDICAL HISTORY:  Status post right total hip replacement arthroplasty, cholecystectomy, surgery not in the distant past for septal defect.  She has a medical history of hypertension, gout, atrial fibrillation and remote history of CVA.  FAMILY HISTORY:  Noncontributory.  SOCIAL HISTORY:  The patient has no intake of alcohol or tobacco products.  ALLERGIES:  She has no medical allergies.  CURRENT MEDICATIONS: 1. Lasix 80 mg once daily. 2. Metoprolol tartrate 800 mg once daily. 3. Hectorol 0.5 mg daily. 4. Diltiazem 300 mg daily. 5. Allopurinol 100 mg daily. 6. Colcrys  0.6 mg once as needed. 7. She currently takes aspirin 325 mg daily. 8. Tylenol 500 mg for pain. 9. Micardis 80 mg  daily. 10.Potassium chloride 20 mEq daily. 11.Sertraline 50 mg daily. 12.Neurontin 300 mg three times a day. 13.Colace p.r.n. 14.Vicodin p.r.n.Marland Kitchen  REVIEW OF SYSTEMS:  CNS:  No seizures, paralysis, or double vision. RESPIRATORY:  No productive cough, no hemoptysis.  No shortness of breath.  CARDIOVASCULAR:  She denies any chest pain or angina. GASTROINTESTINAL:  No nausea, vomiting, or bloody stool.  GENITOURINARY: No discharge, dysuria, or hematuria.  MUSCULOSKELETAL:  Primarily in present illness.  PHYSICAL EXAMINATION:  GENERAL:  Alert, cooperative, and friendly  65- year-old, fully alert and oriented black female, lying on emergency room stretcher.  She is accompanied by her husband. VITAL SIGNS:  Blood pressure 154/74, respirations 12 and unlabored, pulse 70, and temperature 97.7. HEENT: Normocephalic.  PERRLA.  Oropharynx is clear. CHEST:  Clear to auscultation.  No rhonchi.  No rales. HEART: Regular rate and rhythm.  No murmurs are heard. ABDOMEN: Soft and nontender.  Liver and spleen not felt.  Bowel sounds present. GENITALIA/RECTAL/PELVIC/BREASTS:  Not done, not pertinent to present illness. EXTREMITIES:  As mentioned above.  ADMISSION DIAGNOSES: 1. Periprosthetic femoral fracture on the right. 2. Hypertension. 3. Gout. 4. History of atrial fibrillation. 5. History of cerebrovascular accident.  PLAN:  The patient will be admitted to Dr. Pearla Dubonnet  service.  He will consult with Dr. Alvan Dame to determine surgical intervention in the not too distant future.     Dooley L. Vanita Ingles.   ______________________________ Tarri Glenn, M.D.    DLU/MEDQ  D:  03/31/2011  T:  04/01/2011  Job:  [id number]  cc:   Tarri Glenn, M.D. Fax: [phone]  Pietro Cassis. Alvan Dame, M.D. FaxJN:[phone]  Electronically Signed by Tarri Glenn M.D. on 04/03/2011 07:23:06 AM

## 2011-04-04 LAB — CROSSMATCH
ABO/RH(D): O POS
Antibody Screen: NEGATIVE
Unit division: 0
Unit division: 0
Unit division: 0

## 2011-04-04 LAB — CBC
HCT: 24.8 % — ABNORMAL LOW (ref 36.0–46.0)
Hemoglobin: 8.2 g/dL — ABNORMAL LOW (ref 12.0–15.0)
MCH: 28.2 pg (ref 26.0–34.0)
MCHC: 33.1 g/dL (ref 30.0–36.0)
MCV: 85.2 fL (ref 78.0–100.0)
Platelets: 211 10*3/uL (ref 150–400)
RBC: 2.91 MIL/uL — ABNORMAL LOW (ref 3.87–5.11)
RDW: 16.6 % — ABNORMAL HIGH (ref 11.5–15.5)
WBC: 11.3 10*3/uL — ABNORMAL HIGH (ref 4.0–10.5)

## 2011-04-04 LAB — BASIC METABOLIC PANEL
BUN: 49 mg/dL — ABNORMAL HIGH (ref 6–23)
CO2: 27 mEq/L (ref 19–32)
Calcium: 7.8 mg/dL — ABNORMAL LOW (ref 8.4–10.5)
Chloride: 104 mEq/L (ref 96–112)
Creatinine, Ser: 2.55 mg/dL — ABNORMAL HIGH (ref 0.50–1.10)
GFR calc Af Amer: 23 mL/min — ABNORMAL LOW (ref >60)
GFR calc non Af Amer: 19 mL/min — ABNORMAL LOW (ref >60)
Glucose, Bld: 116 mg/dL — ABNORMAL HIGH (ref 70–99)
Potassium: 3.8 mEq/L (ref 3.5–5.1)
Sodium: 139 mEq/L (ref 135–145)

## 2011-04-05 ENCOUNTER — Other Ambulatory Visit: Payer: Self-pay | Admitting: Family Medicine

## 2011-04-05 NOTE — Op Note (Signed)
[name], [name]                 ACCOUNT NO.:  [id number]  MEDICAL RECORD NO.:  [id number]  LOCATION:  [id number]                         FACILITY:  [facility]  PHYSICIAN:  Pietro Cassis. Alvan Dame, M.D.  DATE OF BIRTH:  05/05/1946  DATE OF PROCEDURE:  04/02/2011 DATE OF DISCHARGE:                              OPERATIVE REPORT   PREOPERATIVE DIAGNOSIS:  Right periprosthetic femur fracture classified as a type B with clinical concern for loose femoral component based on fracture pattern.  POSTOPERATIVE DIAGNOSIS/FINDINGS:  Right periprosthetic femur fracture classified as a type B femur fracture; however, I found that the stem was stable at the time of attempted extraction.  Please note that this analysis was due to significant attempt to trying to remove the stem with a slap hammer in addition to then evaluating the initial postoperative films from her index surgery a month ago to her admitting films 2 days ago.  PROCEDURE: 1. Open reduction and internal fixation of right periprosthetic femur     fracture, using 3 Zimmer cables 2. Revision femoral head by exchanging a 32 +5 Delta ceramic ball to a     32 +9 Delta ceramic ball.  SURGEON:  Pietro Cassis. Alvan Dame, MD  ASSISTANT:  Danae Orleans, PA-C  ANESTHESIA:  General.  BLOOD LOSS:  About 800 cc.  FLUIDS:  Included 1 unit of packed red blood cells and 2 liters of crystalloid.  DRAINS:  One Hemovac.  SPECIMENS:  None.  COMPLICATIONS:  None apparent.  INDICATIONS FOR PROCEDURE:  [name] is a 65 year old patient of mine with an index right total hip replacement performed in February 26, 2011. She had initially done very well and was seen in her 2-week visit, doing well with no significant pain from her surgery.  She was walking with her husband on the 15th when she tried to step up on a curve, lost her balance and fell on to her right hip directly, immediate onset of pain, inability to bear weight.  She was brought to the emergency room  where radiographs revealed a periprosthetic femur fracture.  One of my partners, Dr. Jeneen Rinks [place] admitted the patient on my behalf.  I saw her today, reviewed the risks and benefits and necessity of the procedure, their concern about the possible loose component and the planned procedures.  After reviewing these and the risks of infection, DVT, dislocation in the revision setting, she wished to proceed for management of the fracture.  PROCEDURE IN DETAIL:  Patient was brought to operative theater.  Once adequate anesthesia preoperative antibiotics, Ancef administered, she was positioned on to the OR table and then to the left lateral decubitus position with the right side up.  She was positioned with a pegboard. The right hip was then pre scrubbed and cleaned and then prepped and draped in the sterile fashion from the distal thigh to the iliac crest region.  A time-out was performed identifying the patient, planned procedure, and the extremity.  The patient's old incision was identified used and extended mid to distal thigh and a sharp dissection was carried down to the iliotibial band and gluteal fascia.  The first portion of this case with attention to  the to the distal segment of the fracture site.  The fracture site was exposed by means of incising the iliotibial band and then elevating the vastus lateralis from posterior anterior.  The fracture site was identified and with the use of traction, use of Jackson clamps as well as bone hook, I was able to get the fracture reduced into a near anatomic position.  I placed one Zimmer cable in the distal segment and then 2 other cables around the body of the fracture site.  My goal at this point was to recreate the femoral canal for removal of the femoral stem and then subsequent placement of a new stem.  Once the fracture was reduced and the femur moved as an entire unit, I exposed the proximal hip by splitting the gluteus  maximus, opened up the pseudocapsule and identifying the joint.  The hip was internally rotated and traction applied and use of bone was able to dislocate the hip.  The old femoral head was removed.  At this point, I fully intended to remove the stem.  I placed S-ROM slap femoral stem extractor onto the trunnion of the stem and proceeded to try to remove the stem.  I was unsuccessful using the slap hammer device and then having significant impact and blows on this device, I was still unable to remove it.  This was somewhat stunning to me as I did not feel that the fixation of the cables distally would provide this a secure fixation.  I thus evaluated again her postoperative films initially from the index surgery as well as the admitting films and convinced myself that there was no significant subsidence in the femoral stem based on the lateral aspect of the stem in relation to the tip of the trochanter.  Given this and the effort that I put forth in attempting to remove, I was convinced that the stem was stable and there was no significant subsidence based on her anatomic appearance of the stem.  Given this and the stability of the fracture with the Zimmer cables I went down and tensioned these finally and crimped them and cut the ends off.  We irrigated the wound throughout the case and again at this point.  At this point, I reapproximated iliotibial band and the junction of gluteus fascia.  I placed a medium Hemovac deep into the joint space as well as extending into the sub iliotibial band region on lateral thigh. The iliotibial band from its distal to most proximal stent was reapproximated using #1Vicryl and gluteal fascia was reapproximated using #1 Vicryl.  I then closed remaining wound with 2-0 Vicryl and staples on the skin.  The skin was cleaned, dried, and dressed sterilely with Aqua sterile dressing.  The drain sites were dressed separately. She was then brought to the  recovery room in stable condition tolerating the procedure well.  I will plan to have her be partial weightbearing for probably 4 weeks at least until I can see bone healing, maybe 6 weeks.  I would anticipate good healing provided there are no other complicating features.  Questions encouraged and reviewed.     Pietro Cassis Alvan Dame, M.D.     MDO/MEDQ  D:  04/02/2011  T:  04/03/2011  Job:  [id number]  Electronically Signed by Paralee Cancel M.D. on 04/05/2011 08:41:56 PM

## 2011-04-05 NOTE — Discharge Summary (Signed)
NAMEGLYNN, LAATSCH                 ACCOUNT NO.:  [id number]  MEDICAL RECORD NO.:  [id number]  LOCATION:  [id number]                         FACILITY:  [facility]  PHYSICIAN:  Pietro Cassis. Alvan Dame, M.D.  DATE OF BIRTH:  1945/11/10  DATE OF ADMISSION:  03/31/2011 DATE OF DISCHARGE:  04/05/2011                        DISCHARGE SUMMARY - REFERRING   PROCEDURE: 1. Open reduction and internal fixation of the right periprosthetic     femur fracture using Zimmer cables. 2. Revision of femoral head by exchanging the ceramic ball.  ATTENDING PHYSICIAN:  Pietro Cassis. Alvan Dame, M.D.  HISTORY OF PRESENT ILLNESS:  The patient is a 65 year old lady who is post right total hip replacement arthroplasty by Dr. Alvan Dame who fell on March 31, 2011 while stepping up on a curb.  She had immediate pain in the right thigh and hip area.  She was unable to stand after that point.  She was brought to the emergency room by ambulance.  X-rays in the emergency room showed that she had a periprosthetic femoral fracture around the stem of the right femur.  Dr. Shellia Carwin originally saw the patient who discussed the case with Dr. Alvan Dame.  Various options were discussed with the patient.  The patient wishes to proceed with surgery. Risks, benefits and expectations of procedure were discussed with the patient.  The patient understands risks, benefits and expectations and wishes to proceed with an ORIF of the right femur fracture.  HOSPITAL COURSE:  The patient was originally admitted to the hospital onto the [facility] on March 31, 2011.  On April 01, 2011, the patient was doing well.  Pain was well-controlled.  She was afebrile, vital signs stable.  She did have a traction placed.  The patient had a good dorsalis pedis pulse.  On April 02, 2011, pt was good afebrile, vital signs stable, hematocrit was 29.8.  Various options discussed with the patient.  The patient wishes to proceed with surgery.  The patient underwent the  above-stated procedure on April 02, 2011. The patient tolerated the procedure well, was brought to the recovery room in good condition and subsequently to the floor.  Postop day #1, the patient doing well, no events, was feeling a little run down.  Pain is well-controlled at this time.  Due to having poor kidney function, it was felt that the patient would benefit from having 2 units of packed red blood cells.  Hemovac drain was removed.  The patient had physical therapy with 50% weightbearing.  Postop day 2, April 04, 2011, the patient doing well.  She had been feeling much better after receiving blood, she had no events over the night, H and H was 8.2/24.8, dressing was good and clean.  She was distally and neurovascularly intact.  The patient with 50% physical therapy again.  Postop day 3, April 05, 2011, the patient continues to progress and do better.  The patient understood more the concept of 50% weightbearing to the leg and was able to conduct physical therapy much better.  She was afebrile, vital signs stable.  No new labs were done.  She was distally and neurovascularly intact.  Based on the physical therapy, it was felt that the  patient was doing well enough to be discharged home to rehab facility today.  DISCHARGE CONDITION:  Good.  DISCHARGE INSTRUCTIONS:  The patient will be discharged to skilled nursing facility on April 05, 2011.  The patient will be 50% weightbearing to the right leg.  The patient should have daily dressing changes to the right leg.  The patient should keep the area dry and clean until follow-up.  The patient will follow up at [facility] in 2 weeks.  The patient is to call with any questions or concerns.  DISCHARGE MEDICATIONS: 1. Benadryl 25 mg one p.o. q.4 hours p.r.n. 2. Iron sulfate 325 mg one p.o. t.i.d. times 2-3 weeks. 3. Norco 7.5/325 one to two p.o. q.4-6 hours p.r.n. pain. 4. Robaxin 500 mg one p.o. q.6  hours p.r.n. muscle spasms. 5. MiraLAX 17 g p.o. b.i.d. constipation. 6. Aspirin 325 mg one p.o. b.i.d. times 4 weeks. 7. Allopurinol 100 mg three tablets p.o. q.a.m. 8. Colchicine 0.6 mg one p.o. q. day. 9. Diltiazem 300 mg one p.o. q.a.m. 10.Colace 100 mg one p.o. b.i.d. constipation. 11.Lasix 80 mg two p.o. q. day. 12.Gabapentin 300 mg one p.o. t.i.d. 13.Hectorol 0.5 mcg one p.o. q.a.m. 14.Metoprolol tartrate 100 mg one p.o. q.a.m. 15.Micardis 80 mg one p.o. q.h.s. 16.Potassium chloride 20 mEq one p.o. q.a.m. 17.Sertraline 50 mg one p.o. q.a.m. 18.Sotalol 80 mg one p.o. b.i.d.    ______________________________ Danae Orleans, PA   ______________________________ Pietro Cassis. Alvan Dame, M.D.    MB/MEDQ  D:  04/05/2011  T:  04/05/2011  Job:  [id number]  Electronically Signed by Danae Orleans PA on 04/05/2011 01:55:41 PM Electronically Signed by Paralee Cancel M.D. on 04/05/2011 08:42:03 PM

## 2011-04-06 MED ORDER — SERTRALINE HCL 50 MG PO TABS: 50.0000 mg | ORAL_TABLET | Freq: Every day | ORAL | Status: DC

## 2011-04-06 NOTE — Telephone Encounter (Signed)
Last seen 02/02/11 and filled 01/03/11 3 30  with 2 refills.    Please advise     KP

## 2011-04-16 ENCOUNTER — Inpatient Hospital Stay (HOSPITAL_COMMUNITY): Payer: 59

## 2011-04-16 ENCOUNTER — Inpatient Hospital Stay (HOSPITAL_COMMUNITY)
Admission: AD | Admit: 2011-04-16 | Discharge: 2011-04-19 | DRG: 481 | Disposition: A | Discharge status: Skilled Nursing Facility | Payer: 59 | Source: Ambulatory Visit | Attending: Orthopedic Surgery | Admitting: Orthopedic Surgery

## 2011-04-16 ENCOUNTER — Ambulatory Visit (HOSPITAL_COMMUNITY): Admission: RE | Admit: 2011-04-16 | Payer: 59 | Source: Ambulatory Visit

## 2011-04-16 DIAGNOSIS — I4891 Unspecified atrial fibrillation: Secondary | ICD-10-CM | POA: Diagnosis present

## 2011-04-16 DIAGNOSIS — M109 Gout, unspecified: Secondary | ICD-10-CM | POA: Diagnosis present

## 2011-04-16 DIAGNOSIS — I10 Essential (primary) hypertension: Secondary | ICD-10-CM | POA: Diagnosis present

## 2011-04-16 DIAGNOSIS — R32 Unspecified urinary incontinence: Secondary | ICD-10-CM | POA: Diagnosis present

## 2011-04-16 DIAGNOSIS — Y9301 Activity, walking, marching and hiking: Secondary | ICD-10-CM

## 2011-04-16 DIAGNOSIS — Z8673 Personal history of transient ischemic attack (TIA), and cerebral infarction without residual deficits: Secondary | ICD-10-CM

## 2011-04-16 DIAGNOSIS — Z9889 Other specified postprocedural states: Secondary | ICD-10-CM

## 2011-04-16 DIAGNOSIS — D62 Acute posthemorrhagic anemia: Secondary | ICD-10-CM | POA: Diagnosis not present

## 2011-04-16 DIAGNOSIS — G8929 Other chronic pain: Secondary | ICD-10-CM | POA: Diagnosis present

## 2011-04-16 DIAGNOSIS — Y999 Unspecified external cause status: Secondary | ICD-10-CM

## 2011-04-16 DIAGNOSIS — Y9289 Other specified places as the place of occurrence of the external cause: Secondary | ICD-10-CM

## 2011-04-16 DIAGNOSIS — G4733 Obstructive sleep apnea (adult) (pediatric): Secondary | ICD-10-CM | POA: Diagnosis present

## 2011-04-16 DIAGNOSIS — S72309A Unspecified fracture of shaft of unspecified femur, initial encounter for closed fracture: Principal | ICD-10-CM | POA: Diagnosis present

## 2011-04-16 DIAGNOSIS — I517 Cardiomegaly: Secondary | ICD-10-CM | POA: Diagnosis present

## 2011-04-16 DIAGNOSIS — W101XXA Fall (on)(from) sidewalk curb, initial encounter: Secondary | ICD-10-CM | POA: Diagnosis present

## 2011-04-16 DIAGNOSIS — Z79899 Other long term (current) drug therapy: Secondary | ICD-10-CM

## 2011-04-16 DIAGNOSIS — Z96649 Presence of unspecified artificial hip joint: Secondary | ICD-10-CM

## 2011-04-16 DIAGNOSIS — Z7982 Long term (current) use of aspirin: Secondary | ICD-10-CM

## 2011-04-16 LAB — DIFFERENTIAL
Basophils Absolute: 0.1 10*3/uL (ref 0.0–0.1)
Basophils Relative: 0 % (ref 0–1)
Eosinophils Absolute: 0.4 10*3/uL (ref 0.0–0.7)
Eosinophils Relative: 3 % (ref 0–5)
Lymphocytes Relative: 18 % (ref 12–46)
Lymphs Abs: 2.1 10*3/uL (ref 0.7–4.0)
Monocytes Absolute: 0.9 10*3/uL (ref 0.1–1.0)
Monocytes Relative: 7 % (ref 3–12)
Neutro Abs: 8.7 10*3/uL — ABNORMAL HIGH (ref 1.7–7.7)
Neutrophils Relative %: 72 % (ref 43–77)

## 2011-04-16 LAB — CBC
HCT: 28 % — ABNORMAL LOW (ref 36.0–46.0)
Hemoglobin: 8.8 g/dL — ABNORMAL LOW (ref 12.0–15.0)
MCH: 27.2 pg (ref 26.0–34.0)
MCHC: 31.4 g/dL (ref 30.0–36.0)
MCV: 86.7 fL (ref 78.0–100.0)
Platelets: 475 10*3/uL — ABNORMAL HIGH (ref 150–400)
RBC: 3.23 MIL/uL — ABNORMAL LOW (ref 3.87–5.11)
RDW: 17.6 % — ABNORMAL HIGH (ref 11.5–15.5)
WBC: 12.1 10*3/uL — ABNORMAL HIGH (ref 4.0–10.5)

## 2011-04-16 LAB — BASIC METABOLIC PANEL
BUN: 38 mg/dL — ABNORMAL HIGH (ref 6–23)
CO2: 35 mEq/L — ABNORMAL HIGH (ref 19–32)
Calcium: 9.2 mg/dL (ref 8.4–10.5)
Chloride: 99 mEq/L (ref 96–112)
Creatinine, Ser: 1.76 mg/dL — ABNORMAL HIGH (ref 0.50–1.10)
GFR calc Af Amer: 35 mL/min — ABNORMAL LOW (ref >90)
GFR calc non Af Amer: 30 mL/min — ABNORMAL LOW (ref >90)
Glucose, Bld: 87 mg/dL (ref 70–99)
Potassium: 3.5 mEq/L (ref 3.5–5.1)
Sodium: 141 mEq/L (ref 135–145)

## 2011-04-16 LAB — PROTIME-INR
INR: 1.11 (ref 0.00–1.49)
Prothrombin Time: 14.5 seconds (ref 11.6–15.2)

## 2011-04-16 LAB — SURGICAL PCR SCREEN
MRSA, PCR: NEGATIVE
Staphylococcus aureus: NEGATIVE

## 2011-04-16 LAB — APTT: aPTT: 35 seconds (ref 24–37)

## 2011-04-16 IMAGING — CR DG PORTABLE PELVIS
1 series · 1 of 1 positions shown · non-contrast
Comparison: [DATE]

CLINICAL DATA: Postop ORIF of the right femur

PORTABLE PELVIS

[AP]
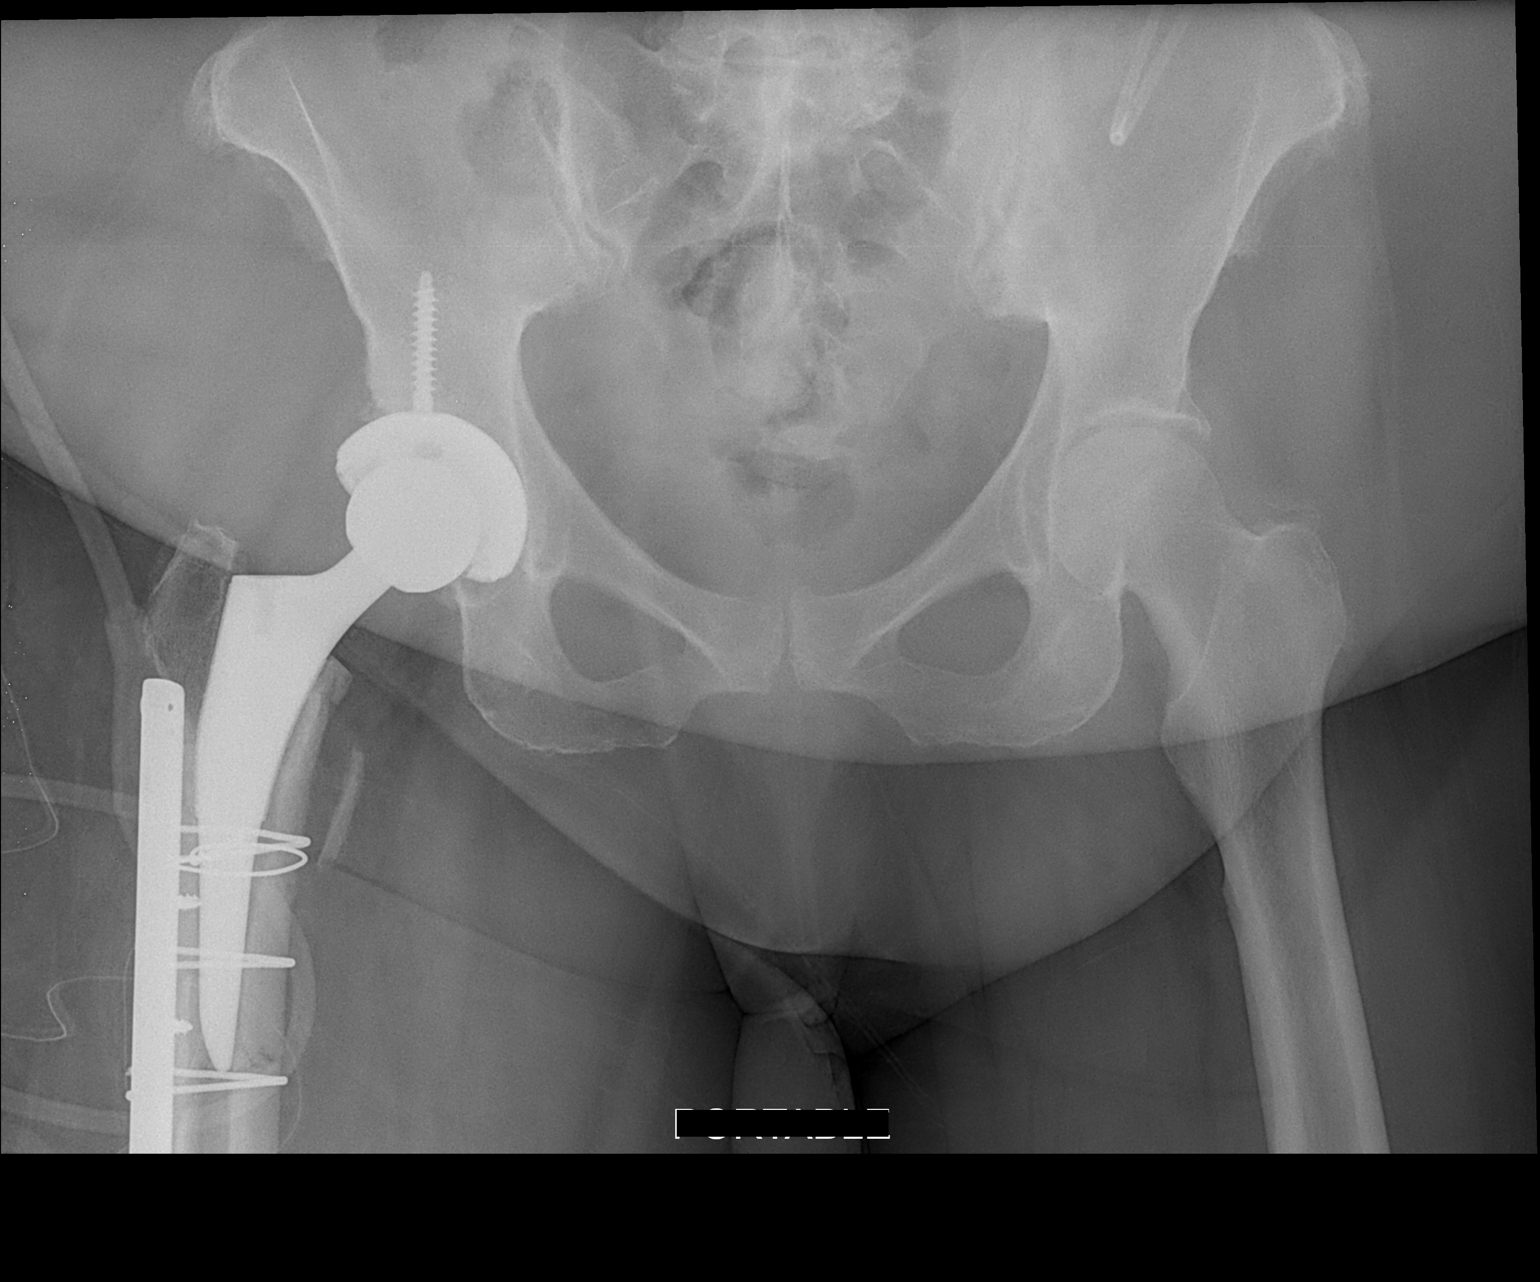

[1 of 1 positions shown; findings below may reference images not displayed]

FINDINGS: The patient has undergone interval side plate and
cerclage fixation of previously described oblique fracture of the
proximal femoral metaphysis involving the femoral shaft component
of the right total hip replacement.  The inferior aspect of the
side plate fixation is excluded from view.  There is an
approximately 3.5 cm osseous fragment about the medial aspect of
the femoral metaphysis.  Alignment appears anatomic.
IMPRESSION: Post side plate and cerclage fixation of oblique fracture of the
femoral metaphysis as above.

## 2011-04-16 IMAGING — CR DG FEMUR 2+V PORT*R*
1 series · 5 of 5 positions shown · non-contrast
Comparison: [DATE]

CLINICAL DATA: Postop ORIF right femur

PORTABLE RIGHT FEMUR - 2 VIEW

[Series 1: AP · right · 5 of 5 slices shown]
[im 1/5]
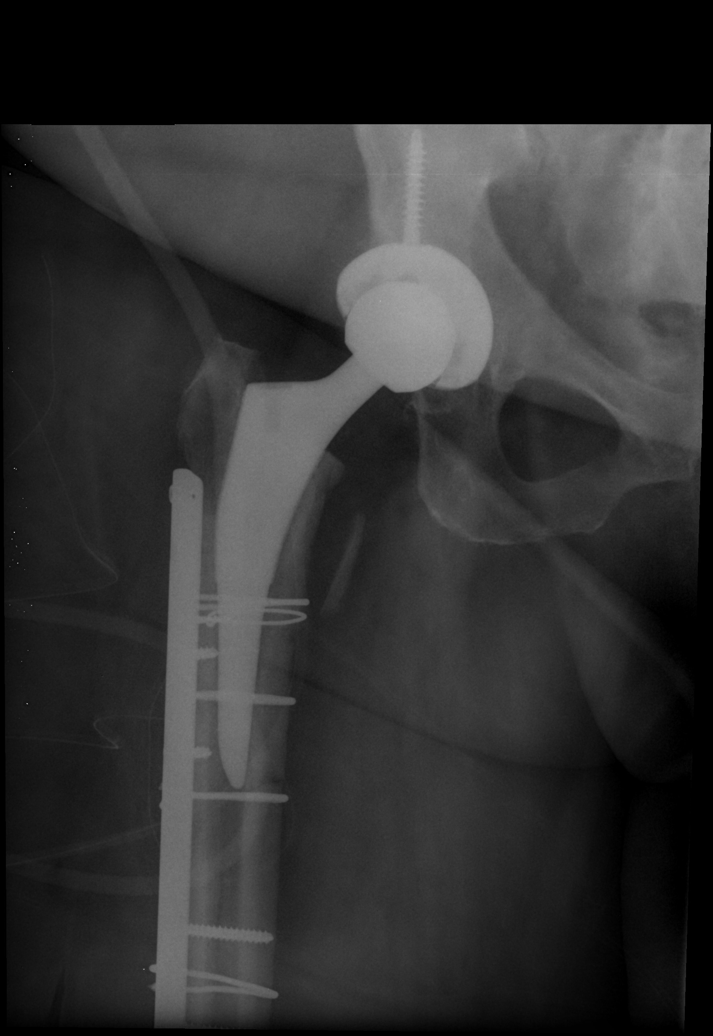
[im 2/5]
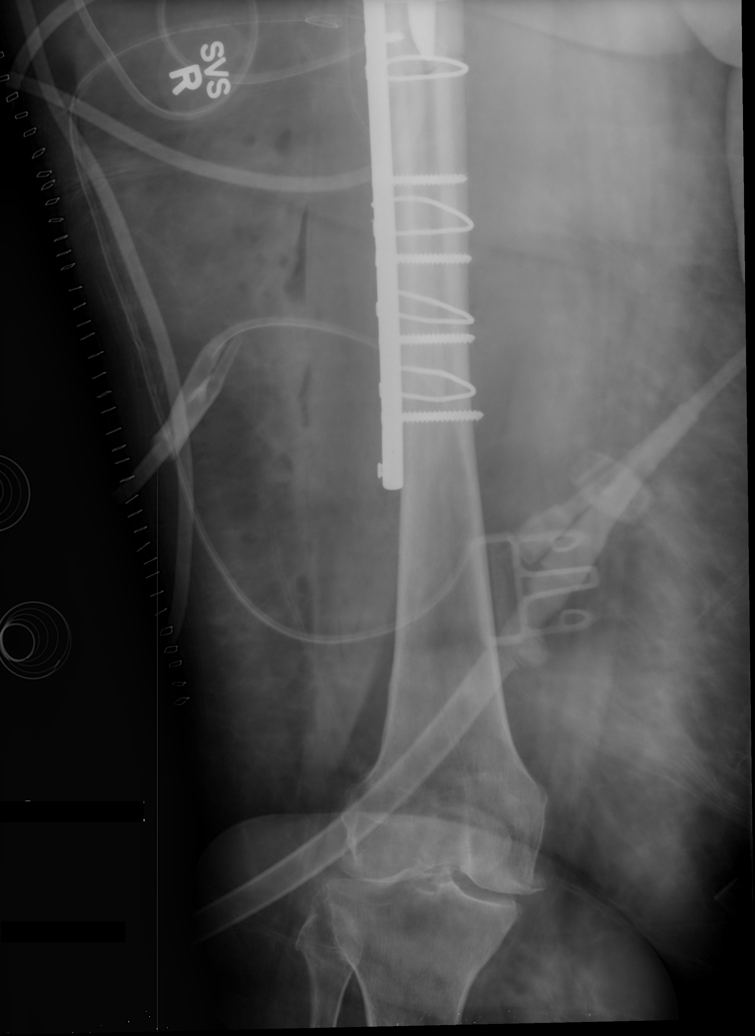
[im 3/5]
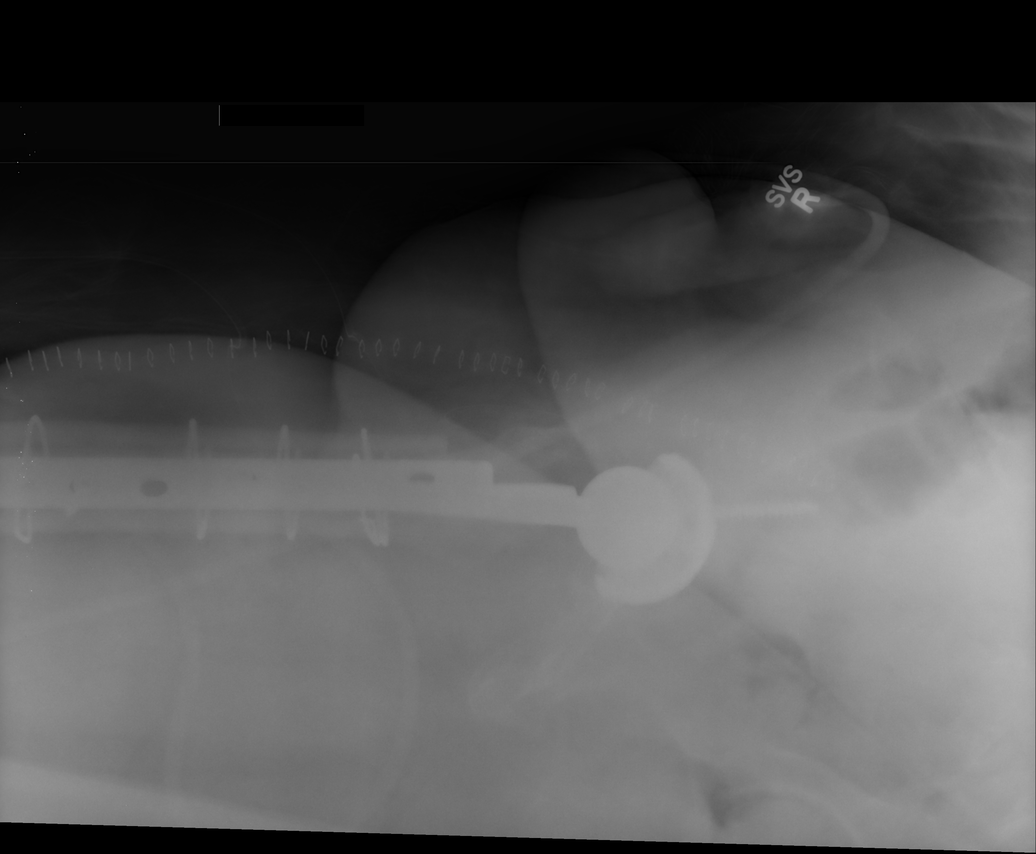
[im 4/5]
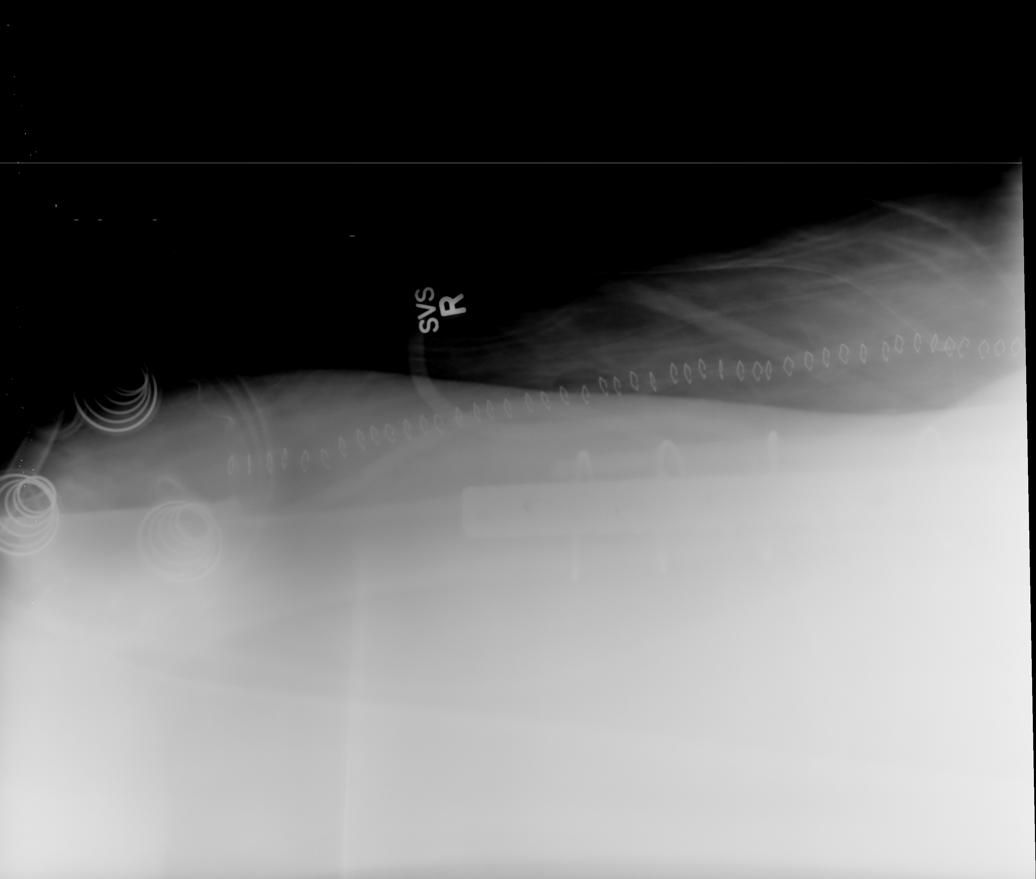
[im 5/5]
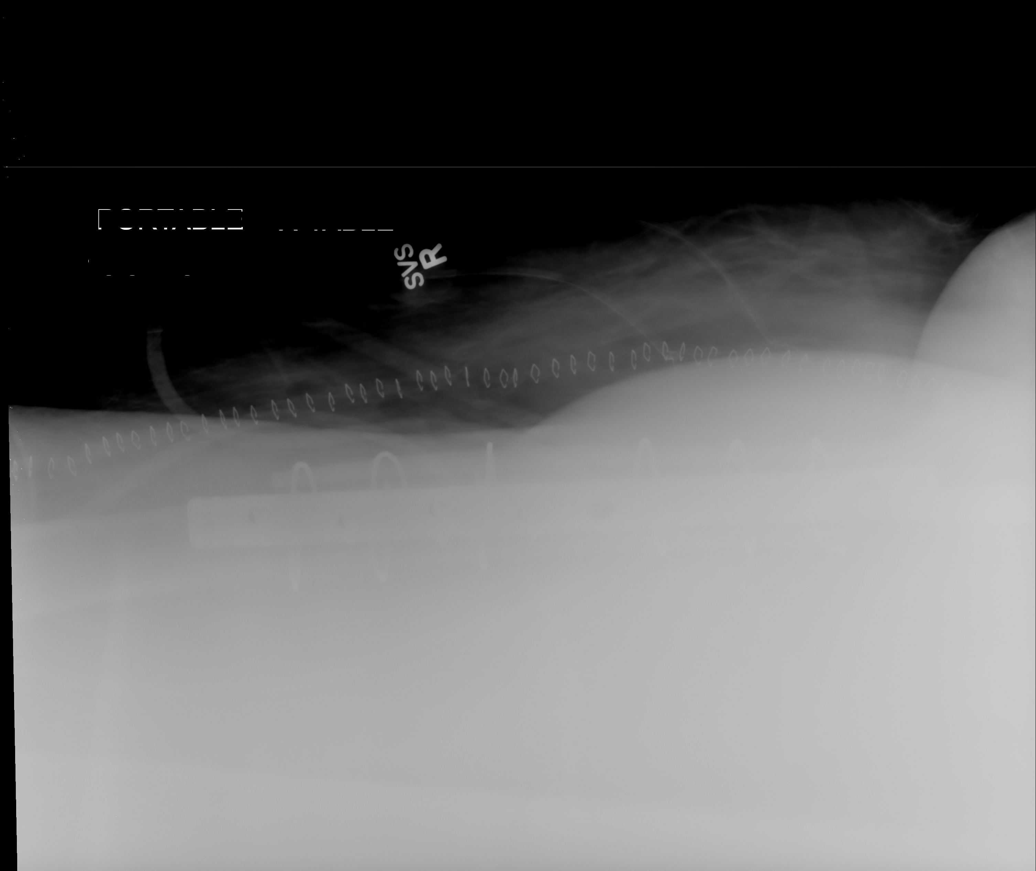

[5 of 5 positions shown; findings below may reference images not displayed]

FINDINGS: The patient has undergone interval sideplate and cerclage
fixation of previously described oblique fracture of the proximal
aspect of the right femur, involving the femoral shaft component of
the right total hip replacement.  There is a minimally displaced
osseous fragment measuring approximately 3.6 cm in length about the
medial aspect of the proximal femoral metaphysis.  Alignment
appears near anatomic.  Limited visualization of the knee
demonstrates bicompartmental degenerative change.  There is
expected subcutaneous emphysema about the operative site.
IMPRESSION: Post ORIF of proximal femoral fracture as above.

## 2011-04-16 IMAGING — US IR VENIPUNCTURE 3YRS/OLDER BY MD
1 series · 1 of 1 positions shown · non-contrast
Comparison: none

CLINICAL DATA: The patient requires revision of a right hip
arthroplasty.  There has been inability to gain IV access
preoperatively for anesthesia.

[Series 1: ir fluoro guide cv line*right* · 1 of 1 slices shown]
[im 1/1]
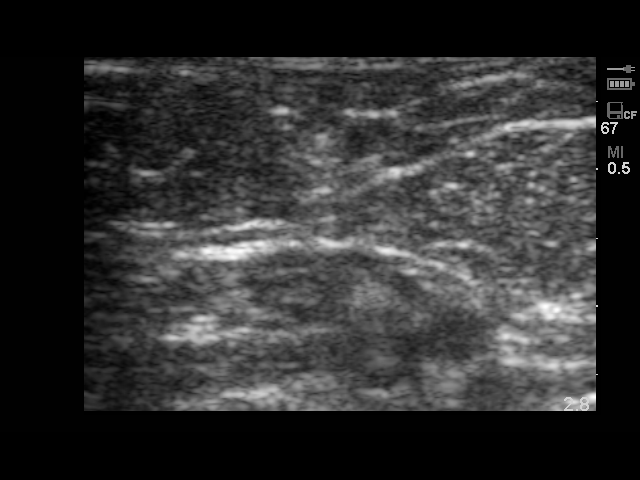

[1 of 1 positions shown; findings below may reference images not displayed]

IV ACCESS UNDER ULTRASOUND GUIDANCE

The left arm was prepped with chlorhexidine, draped in the usual
sterile fashion and infiltrated locally with 1% Lidocaine.

Ultrasound demonstrated patency of the left brachial vein, and this
was documented with an image.  Under real-time ultrasound guidance,
this vein was accessed with a 21 gauge micropuncture needle and
image documentation was performed.  A guidewire was advanced.  A 4-
French micropuncture dilator was then advanced as a midline IV
access.  The catheter was connected to IV tubing and aspirated and
flushed with sterile saline.   and covered with a sterile dressing.

Complications:  None
IMPRESSION: Successful left arm IV access under ultrasound guidance.  A 4-
French micropuncture dilator was placed as midline IV access.

## 2011-04-17 LAB — CBC
HCT: 25.1 % — ABNORMAL LOW (ref 36.0–46.0)
Hemoglobin: 8.2 g/dL — ABNORMAL LOW (ref 12.0–15.0)
MCH: 28.1 pg (ref 26.0–34.0)
MCHC: 32.7 g/dL (ref 30.0–36.0)
MCV: 86 fL (ref 78.0–100.0)
Platelets: 392 10*3/uL (ref 150–400)
RBC: 2.92 MIL/uL — ABNORMAL LOW (ref 3.87–5.11)
RDW: 17.1 % — ABNORMAL HIGH (ref 11.5–15.5)
WBC: 16.3 10*3/uL — ABNORMAL HIGH (ref 4.0–10.5)

## 2011-04-17 LAB — BASIC METABOLIC PANEL
BUN: 45 mg/dL — ABNORMAL HIGH (ref 6–23)
CO2: 33 mEq/L — ABNORMAL HIGH (ref 19–32)
Calcium: 8.4 mg/dL (ref 8.4–10.5)
Chloride: 98 mEq/L (ref 96–112)
Creatinine, Ser: 2.24 mg/dL — ABNORMAL HIGH (ref 0.50–1.10)
GFR calc Af Amer: 26 mL/min — ABNORMAL LOW (ref >90)
GFR calc non Af Amer: 22 mL/min — ABNORMAL LOW (ref >90)
Glucose, Bld: 105 mg/dL — ABNORMAL HIGH (ref 70–99)
Potassium: 4.5 mEq/L (ref 3.5–5.1)
Sodium: 137 mEq/L (ref 135–145)

## 2011-04-18 LAB — BASIC METABOLIC PANEL
BUN: 49 mg/dL — ABNORMAL HIGH (ref 6–23)
CO2: 30 mEq/L (ref 19–32)
Calcium: 8 mg/dL — ABNORMAL LOW (ref 8.4–10.5)
Chloride: 99 mEq/L (ref 96–112)
Creatinine, Ser: 2.51 mg/dL — ABNORMAL HIGH (ref 0.50–1.10)
GFR calc Af Amer: 23 mL/min — ABNORMAL LOW (ref >90)
GFR calc non Af Amer: 19 mL/min — ABNORMAL LOW (ref >90)
Glucose, Bld: 101 mg/dL — ABNORMAL HIGH (ref 70–99)
Potassium: 4.1 mEq/L (ref 3.5–5.1)
Sodium: 136 mEq/L (ref 135–145)

## 2011-04-18 LAB — CBC
HCT: 21.1 % — ABNORMAL LOW (ref 36.0–46.0)
Hemoglobin: 6.9 g/dL — CL (ref 12.0–15.0)
MCH: 28 pg (ref 26.0–34.0)
MCHC: 32.7 g/dL (ref 30.0–36.0)
MCV: 85.8 fL (ref 78.0–100.0)
Platelets: 341 10*3/uL (ref 150–400)
RBC: 2.46 MIL/uL — ABNORMAL LOW (ref 3.87–5.11)
RDW: 17.3 % — ABNORMAL HIGH (ref 11.5–15.5)
WBC: 14.3 10*3/uL — ABNORMAL HIGH (ref 4.0–10.5)

## 2011-04-19 LAB — CROSSMATCH
ABO/RH(D): O POS
Antibody Screen: NEGATIVE
Unit division: 0
Unit division: 0
Unit division: 0
Unit division: 0

## 2011-04-19 LAB — CBC
HCT: 26.3 % — ABNORMAL LOW (ref 36.0–46.0)
Hemoglobin: 8.7 g/dL — ABNORMAL LOW (ref 12.0–15.0)
MCH: 28.4 pg (ref 26.0–34.0)
MCHC: 33.1 g/dL (ref 30.0–36.0)
MCV: 85.9 fL (ref 78.0–100.0)
Platelets: 323 10*3/uL (ref 150–400)
RBC: 3.06 MIL/uL — ABNORMAL LOW (ref 3.87–5.11)
RDW: 16.5 % — ABNORMAL HIGH (ref 11.5–15.5)
WBC: 12.5 10*3/uL — ABNORMAL HIGH (ref 4.0–10.5)

## 2011-04-19 NOTE — Op Note (Signed)
NAMEBRYTON, [name]                 ACCOUNT NO.:  [id number]  MEDICAL RECORD NO.:  [id number]  LOCATION:                               FACILITY:  [facility]  PHYSICIAN:  Pietro Cassis. Alvan Dame, M.D.  DATE OF BIRTH:  Nov 12, 1945  DATE OF PROCEDURE:  04/16/2011 DATE OF DISCHARGE:                              OPERATIVE REPORT   PREOPERATIVE DIAGNOSIS:  Recurrent periprosthetic femur fracture and stable prosthetic stem.  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  PROCEDURE:  Open reduction and internal fixation of right periprosthetic femur fracture utilizing an 8-hole Zimmer Cable Plate and a femoral strut cortical allograft.  SURGEON:  Pietro Cassis. Alvan Dame, M.D.  ASSISTANT:  Danae Orleans, PA-C  ANESTHESIA:  General.  SPECIMENS:  None.  COMPLICATIONS:  None.  DRAINS:  Two Hemovac.  ESTIMATED BLOOD LOSS:  About 700 cc.  INDICATION TO THE PROCEDURE:  Ms. Tolleson is a 65 year old female patient of mine with a history of right total hip replacement approximately 2 months ago.  Approximately 2 weeks ago, she fell while trying to go up a curb and sustained a periprosthetic fracture at that time.  She was deemed to have a stable type B.  Periprosthetic femur fracture was stable, femoral component.  At that time, we placed 3 cables around it. She was at rehab when she unfortunately had this repeat event regarding the right hip.  She had radiographs revealed a displaced femoral fracture, distal to stem at this point.  She was seen in the office, admitted to the hospital for the procedure. Risks and benefits were discussed, reviewed, consent was obtained for fracture management.  PROCEDURE IN DETAIL:  The patient was brought to the operative theater. Once adequate anesthesia, preoperative antibiotics, Ancef 2 g administered, she was positioned into the left lateral decubitus position with the right side up.  Following pre-scrub and then  cleaning her perineal and right lower extremity, we prepped and draped the right lower extremity in the sterile fashion.  Time-out was performed identifying the patient, planned procedure, and extremity.  I removed two-thirds of her incisional stapler that were present before, keeping the remaining posterior portion.  Sharp dissection was carried down through the skin, subcutaneous tissue, entering into large seromatous  cavity.  I evacuated probably 200 cc of seroma at this point.  Further debridement, dissection was carried down to the iliotibial band. I then used pulse lavage at this point.  We irrigated out this entire area, cleaned, and debriding it lightly at this point.  At this point, the iliotibial band was split and then the vastus lateralis elevated anteriorly over the femur distally.  I carried this dissection more proximal to identify the previous procedure in addition to the previous cables.  Fracture site was identified, an old cable at the distal portion of the stem and at the level of the fracture site was removed.  The fracture on the femur was then reduced into an anatomic position.  At this point, I determined I would use a  8-hole plate and femoral strut allograft.  The strut allograft was placed anteriorly and then 8-hole plate laterally. With these two there, the  fracture was held in reduced fashion.  Please note that the cables that were  previously placed, the clamps for them were on the direct lateral aspect of the femur and thus right in the way of the plate.  I thus placed 2 provisional 16 gauge wires at the location of these cables so they could be removed.  This allowed me to place the plate more directly onto the femur.  With the strut allograft anterior, the plate now on the bone laterally, and the tube being held with a [place] clamp, I placed the distal 4.5 mm cortical screw.  Once I felt that the plate was best aligned along the shaft of  the femur, I went ahead and placed 2 of the cables on the proximal segment of plate around the prosthetic component, around allograft, and within the plate.  These were tensioned, but then locked in place to later be retensioned.  At this point, I had filled in the plate with screws and cables.  On the proximal 4 screws, I placed 3 cables in this segment.  I placed 2 unicortical screws in the segment.  The most proximal of the screws and cable, however, I was unable to utilize based on location as it was too proximal.  On the distal segment, 4 cortical screws were placed, bicortical screws, as well as 3 cables holding the allograft to the anterior aspect of femur.  All cables were appropriately tensioned, trimmed, and cut.  Following all of this procedure, the wound was re-irrigated with normal saline solution pulse lavage.  At this point, the iliotibial band was reapproximated to itself using #1 Vicryl interrupted sutures.  I placed a medium Hemovac into the deep tissue layer.  Following this, I reapproximated subcu layer with 2-0 Vicryl.  I did place the subcu Hemovac into this previous seromatous area.  The remaining wound was closed with staples.  I did use a little bit of Dermabond on some of the previous scar tissue.  The remaining wound was cleaned, dried, and dressed sterilely using Mepilex dressing.  The 2 drains were dressed separately.  She was then brought to recovery room, extubated in stable condition, tolerating the procedure well.  PLAN:  Touchdown weightbearing as best as possible for probably 2 months.  This may require wheelchair mobility based on the size and inability to perform activities in this fashion.  I reviewed all these findings with her husband.     Pietro Cassis Alvan Dame, M.D.     MDO/MEDQ  D:  04/16/2011  T:  04/17/2011  Job:  [id number]  Electronically Signed by Paralee Cancel M.D. on 04/19/2011 08:17:06 AM

## 2011-04-19 NOTE — Op Note (Signed)
  NAMEBRICEYDA, [name]                 ACCOUNT NO.:  [id number]  MEDICAL RECORD NO.:  [id number]  LOCATION:                               FACILITY:  [facility]  PHYSICIAN:  Pietro Cassis. Alvan Dame, M.D.  DATE OF BIRTH:  [birth year]  DATE OF PROCEDURE:  04/16/2011 DATE OF DISCHARGE:                              OPERATIVE REPORT   ADDENDUM: Physician assistant, Danae Orleans, was present for the entirety of the case for preoperative moving of the patient, preoperative positioning, perioperative retractor management, and general facilitation of case as well as primary wound closure.     Pietro Cassis Alvan Dame, M.D.     MDO/MEDQ  D:  04/16/2011  T:  04/17/2011  Job:  [id number]  Electronically Signed by Paralee Cancel M.D. on 04/19/2011 08:17:13 AM

## 2011-04-30 ENCOUNTER — Inpatient Hospital Stay (HOSPITAL_COMMUNITY)
Admission: EM | Admit: 2011-04-30 | Discharge: 2011-05-09 | DRG: 903 | Disposition: A | Discharge status: Skilled Nursing Facility | Payer: Medicare Other | Attending: Orthopedic Surgery | Admitting: Orthopedic Surgery

## 2011-04-30 DIAGNOSIS — Z96649 Presence of unspecified artificial hip joint: Secondary | ICD-10-CM

## 2011-04-30 DIAGNOSIS — I4891 Unspecified atrial fibrillation: Secondary | ICD-10-CM | POA: Diagnosis present

## 2011-04-30 DIAGNOSIS — R32 Unspecified urinary incontinence: Secondary | ICD-10-CM | POA: Diagnosis present

## 2011-04-30 DIAGNOSIS — Z8673 Personal history of transient ischemic attack (TIA), and cerebral infarction without residual deficits: Secondary | ICD-10-CM

## 2011-04-30 DIAGNOSIS — G4730 Sleep apnea, unspecified: Secondary | ICD-10-CM | POA: Diagnosis present

## 2011-04-30 DIAGNOSIS — I10 Essential (primary) hypertension: Secondary | ICD-10-CM | POA: Diagnosis present

## 2011-04-30 DIAGNOSIS — E669 Obesity, unspecified: Secondary | ICD-10-CM | POA: Diagnosis present

## 2011-04-30 DIAGNOSIS — G8929 Other chronic pain: Secondary | ICD-10-CM | POA: Diagnosis present

## 2011-04-30 DIAGNOSIS — Z79899 Other long term (current) drug therapy: Secondary | ICD-10-CM

## 2011-04-30 DIAGNOSIS — Y831 Surgical operation with implant of artificial internal device as the cause of abnormal reaction of the patient, or of later complication, without mention of misadventure at the time of the procedure: Secondary | ICD-10-CM | POA: Diagnosis present

## 2011-04-30 DIAGNOSIS — M109 Gout, unspecified: Secondary | ICD-10-CM | POA: Diagnosis present

## 2011-04-30 DIAGNOSIS — Z7982 Long term (current) use of aspirin: Secondary | ICD-10-CM

## 2011-04-30 DIAGNOSIS — D72829 Elevated white blood cell count, unspecified: Secondary | ICD-10-CM | POA: Diagnosis present

## 2011-04-30 DIAGNOSIS — Z9889 Other specified postprocedural states: Secondary | ICD-10-CM

## 2011-04-30 DIAGNOSIS — IMO0002 Reserved for concepts with insufficient information to code with codable children: Principal | ICD-10-CM | POA: Diagnosis present

## 2011-04-30 LAB — URINALYSIS, ROUTINE W REFLEX MICROSCOPIC
Bilirubin Urine: NEGATIVE
Glucose, UA: NEGATIVE mg/dL
Ketones, ur: NEGATIVE mg/dL
Nitrite: NEGATIVE
Protein, ur: 30 mg/dL — AB
Specific Gravity, Urine: 1.013 (ref 1.005–1.030)
Urobilinogen, UA: 0.2 mg/dL (ref 0.0–1.0)
pH: 5.5 (ref 5.0–8.0)

## 2011-04-30 LAB — URINE MICROSCOPIC-ADD ON

## 2011-04-30 NOTE — H&P (Signed)
[name], [name]                 ACCOUNT NO.:  [id number]  MEDICAL RECORD NO.:  [id number]  LOCATION:                               FACILITY:  [facility]  PHYSICIAN:  [name]. Chabon, P.A.DATE OF BIRTH:  10/25/1945  DATE OF ADMISSION:  04/16/2011 DATE OF DISCHARGE:                             HISTORY & PHYSICAL   ADMITTING DIAGNOSIS:  Recurrent periprosthetic fracture, right hip.  HISTORY OF PRESENT ILLNESS:  This is a 65 year old lady with a history of recent total hip arthroplasty on the right who sustained a periprosthetic fracture, underwent open cabling of her fracture, and this past Friday, sustained a fall with pain and difficulty ambulating, had recurrent x-ray and it showed a new periprosthetic fracture distal to her cabling of her previous fracture.  At this time, the patient is to be admitted to the hospital for open reduction and internal fixation, possible exchange, total hip arthroplasty for long stem prosthesis. PAST MEDICAL HISTORY:  Drug allergy to COUMADIN.  CURRENT MEDICATIONS: 1. Zyloprim 300 mg one q.a.m. 2. Colcrys 0.6 mg one q.a.m. 3. Cardizem 300 mg one q.a.m. 4. Hectorol 0.5 mcg one q.a.m. 5. Lopressor 100 mg one q.a.m. 6. K-Dur 20 mEq one q.a.m. 7. Zoloft 15 mg one q.a.m. 8. Aspirin 325 mg one b.i.d. 9. Colace 100 mg one q.twice a day. 10.Lasix 80 mg 2 tablets b.i.d. 11.MiraLax 17 g once a day. 12.Betapace 80 mg b.i.d. 13.Ferrous sulfate 325 mg one t.i.d. 14.Neurontin 300 mg one t.i.d. 15.Micardis 80 mg one q.h.s. 16.Norco 7.5/325 one q.4 h. p.r.n. pain. 17.Robaxin 500 one p.o. q.8 p.r.n. spasm.  MEDICAL ILLNESSES:  Include hypertension, chronic pain, gout, history of CVA.  Also positive for sleep apnea, atrial fibrillation in the past, hypertension, and occasional urinary incontinence.  FAMILY HISTORY:  Positive for Hodgkin disease.  SOCIAL HISTORY:  The patient is married.  She is retired.  She does not smoke or drink.  PREVIOUS  SURGERIES:  Include atrial septal defect repaired at age 78, cholecystectomy, lap band exploratory lap, and then total hip arthroplasty and ORIF of periprosthetic fracture.  PHYSICAL EXAMINATION:  VITAL SIGNS:  Today, her blood pressure is 170/84, respirations 18, and pulse 80 and regular. GENERAL APPEARANCE:  This an obese lady in mild distress with her right leg. HEENT:  Head normocephalic.  Nose patent.  Nares patent.  Pupils equal, round, and reactive to light.  Throat without injection.  NECK:  Supple without adenopathy.  Carotids 2+ without bruit. CHEST:  Clear to auscultation.  No rales or rhonchi.  Respirations 18. HEART:  Regular rate and rhythm at 80 beats without murmur. ABDOMEN:  Soft with active bowel sounds.  No masses or organomegaly. NEUROLOGIC:  The patient alert and oriented to time, place, and person. Cranial nerves II through XII grossly intact. EXTREMITIES:  Shows the right leg slightly externally rotated. NEUROVASCULAR:  Status intact.  Her surgical wound is benign, her calf is negative.  She is tender to palpation of the mid thigh.  X-rays today show periprosthetic fracture distal to the distal most cable oblique and displaced.  ASSESSMENT:  Periprosthetic fracture, right femur.  PLAN:  ORIF versus long stent total hip arthroplasty  exchange.     [name]. Amado Coe     SJC/MEDQ  D:  04/16/2011  T:  04/16/2011  Job:  [id number]  Electronically Signed by Gerrit Halls P.A. on 04/17/2011 09:03:41 AM Electronically Signed by Paralee Cancel M.D. on 04/30/2011 08:49:53 AM

## 2011-05-01 LAB — CBC
HCT: 25.3 % — ABNORMAL LOW (ref 36.0–46.0)
Hemoglobin: 8 g/dL — ABNORMAL LOW (ref 12.0–15.0)
MCH: 26.9 pg (ref 26.0–34.0)
MCHC: 31.6 g/dL (ref 30.0–36.0)
MCV: 85.2 fL (ref 78.0–100.0)
Platelets: 536 10*3/uL — ABNORMAL HIGH (ref 150–400)
RBC: 2.97 MIL/uL — ABNORMAL LOW (ref 3.87–5.11)
RDW: 17 % — ABNORMAL HIGH (ref 11.5–15.5)
WBC: 12.9 10*3/uL — ABNORMAL HIGH (ref 4.0–10.5)

## 2011-05-01 LAB — BASIC METABOLIC PANEL
BUN: 40 mg/dL — ABNORMAL HIGH (ref 6–23)
CO2: 30 mEq/L (ref 19–32)
Calcium: 8.7 mg/dL (ref 8.4–10.5)
Chloride: 99 mEq/L (ref 96–112)
Creatinine, Ser: 1.82 mg/dL — ABNORMAL HIGH (ref 0.50–1.10)
GFR calc Af Amer: 33 mL/min — ABNORMAL LOW (ref >90)
GFR calc non Af Amer: 29 mL/min — ABNORMAL LOW (ref >90)
Glucose, Bld: 93 mg/dL (ref 70–99)
Potassium: 3.8 mEq/L (ref 3.5–5.1)
Sodium: 137 mEq/L (ref 135–145)

## 2011-05-01 LAB — SURGICAL PCR SCREEN
MRSA, PCR: NEGATIVE
Staphylococcus aureus: NEGATIVE

## 2011-05-02 LAB — CBC
HCT: 20.7 % — ABNORMAL LOW (ref 36.0–46.0)
Hemoglobin: 6.5 g/dL — CL (ref 12.0–15.0)
MCH: 27 pg (ref 26.0–34.0)
MCHC: 31.4 g/dL (ref 30.0–36.0)
MCV: 85.9 fL (ref 78.0–100.0)
Platelets: 453 10*3/uL — ABNORMAL HIGH (ref 150–400)
RBC: 2.41 MIL/uL — ABNORMAL LOW (ref 3.87–5.11)
RDW: 17.3 % — ABNORMAL HIGH (ref 11.5–15.5)
WBC: 14.1 10*3/uL — ABNORMAL HIGH (ref 4.0–10.5)

## 2011-05-02 LAB — BASIC METABOLIC PANEL
BUN: 34 mg/dL — ABNORMAL HIGH (ref 6–23)
CO2: 30 mEq/L (ref 19–32)
Calcium: 8.4 mg/dL (ref 8.4–10.5)
Chloride: 103 mEq/L (ref 96–112)
Creatinine, Ser: 1.88 mg/dL — ABNORMAL HIGH (ref 0.50–1.10)
GFR calc Af Amer: 32 mL/min — ABNORMAL LOW (ref >90)
GFR calc non Af Amer: 28 mL/min — ABNORMAL LOW (ref >90)
Glucose, Bld: 108 mg/dL — ABNORMAL HIGH (ref 70–99)
Potassium: 4 mEq/L (ref 3.5–5.1)
Sodium: 138 mEq/L (ref 135–145)

## 2011-05-02 LAB — DIFFERENTIAL
Basophils Absolute: 0.1 10*3/uL (ref 0.0–0.1)
Basophils Relative: 1 % (ref 0–1)
Eosinophils Absolute: 0.2 10*3/uL (ref 0.0–0.7)
Eosinophils Relative: 1 % (ref 0–5)
Lymphocytes Relative: 14 % (ref 12–46)
Lymphs Abs: 1.9 10*3/uL (ref 0.7–4.0)
Monocytes Absolute: 1.4 10*3/uL — ABNORMAL HIGH (ref 0.1–1.0)
Monocytes Relative: 10 % (ref 3–12)
Neutro Abs: 10.6 10*3/uL — ABNORMAL HIGH (ref 1.7–7.7)
Neutrophils Relative %: 75 % (ref 43–77)

## 2011-05-02 LAB — URINE CULTURE
Colony Count: 15000
Culture  Setup Time: 201210160120

## 2011-05-02 LAB — PREPARE RBC (CROSSMATCH)

## 2011-05-03 LAB — CBC
HCT: 25.8 % — ABNORMAL LOW (ref 36.0–46.0)
Hemoglobin: 8.4 g/dL — ABNORMAL LOW (ref 12.0–15.0)
MCH: 27.8 pg (ref 26.0–34.0)
MCHC: 32.6 g/dL (ref 30.0–36.0)
MCV: 85.4 fL (ref 78.0–100.0)
Platelets: 362 10*3/uL (ref 150–400)
RBC: 3.02 MIL/uL — ABNORMAL LOW (ref 3.87–5.11)
RDW: 16.1 % — ABNORMAL HIGH (ref 11.5–15.5)
WBC: 14.3 10*3/uL — ABNORMAL HIGH (ref 4.0–10.5)

## 2011-05-03 LAB — VANCOMYCIN, TROUGH: Vancomycin Tr: 59.4 ug/mL (ref 10.0–20.0)

## 2011-05-03 LAB — DIFFERENTIAL
Basophils Absolute: 0.1 10*3/uL (ref 0.0–0.1)
Basophils Relative: 0 % (ref 0–1)
Eosinophils Absolute: 0.3 10*3/uL (ref 0.0–0.7)
Eosinophils Relative: 2 % (ref 0–5)
Lymphocytes Relative: 14 % (ref 12–46)
Lymphs Abs: 2 10*3/uL (ref 0.7–4.0)
Monocytes Absolute: 1.5 10*3/uL — ABNORMAL HIGH (ref 0.1–1.0)
Monocytes Relative: 11 % (ref 3–12)
Neutro Abs: 10.5 10*3/uL — ABNORMAL HIGH (ref 1.7–7.7)
Neutrophils Relative %: 74 % (ref 43–77)

## 2011-05-03 LAB — BASIC METABOLIC PANEL
BUN: 34 mg/dL — ABNORMAL HIGH (ref 6–23)
CO2: 28 mEq/L (ref 19–32)
Calcium: 8.5 mg/dL (ref 8.4–10.5)
Chloride: 102 mEq/L (ref 96–112)
Creatinine, Ser: 2 mg/dL — ABNORMAL HIGH (ref 0.50–1.10)
GFR calc Af Amer: 30 mL/min — ABNORMAL LOW (ref >90)
GFR calc non Af Amer: 26 mL/min — ABNORMAL LOW (ref >90)
Glucose, Bld: 101 mg/dL — ABNORMAL HIGH (ref 70–99)
Potassium: 4 mEq/L (ref 3.5–5.1)
Sodium: 136 mEq/L (ref 135–145)

## 2011-05-04 LAB — BASIC METABOLIC PANEL
BUN: 30 mg/dL — ABNORMAL HIGH (ref 6–23)
CO2: 24 mEq/L (ref 19–32)
Calcium: 8.3 mg/dL — ABNORMAL LOW (ref 8.4–10.5)
Chloride: 103 mEq/L (ref 96–112)
Creatinine, Ser: 2.04 mg/dL — ABNORMAL HIGH (ref 0.50–1.10)
GFR calc Af Amer: 29 mL/min — ABNORMAL LOW (ref >90)
GFR calc non Af Amer: 25 mL/min — ABNORMAL LOW (ref >90)
Glucose, Bld: 95 mg/dL (ref 70–99)
Potassium: 3.8 mEq/L (ref 3.5–5.1)
Sodium: 136 mEq/L (ref 135–145)

## 2011-05-04 LAB — CBC
HCT: 23.1 % — ABNORMAL LOW (ref 36.0–46.0)
Hemoglobin: 7.7 g/dL — ABNORMAL LOW (ref 12.0–15.0)
MCH: 28.4 pg (ref 26.0–34.0)
MCHC: 33.3 g/dL (ref 30.0–36.0)
MCV: 85.2 fL (ref 78.0–100.0)
Platelets: 340 10*3/uL (ref 150–400)
RBC: 2.71 MIL/uL — ABNORMAL LOW (ref 3.87–5.11)
RDW: 16.4 % — ABNORMAL HIGH (ref 11.5–15.5)
WBC: 15.6 10*3/uL — ABNORMAL HIGH (ref 4.0–10.5)

## 2011-05-04 LAB — VANCOMYCIN, RANDOM: Vancomycin Rm: 55.4 ug/mL

## 2011-05-05 LAB — PREPARE RBC (CROSSMATCH)

## 2011-05-06 LAB — CROSSMATCH
ABO/RH(D): O POS
Antibody Screen: NEGATIVE
Unit division: 0
Unit division: 0
Unit division: 0
Unit division: 0

## 2011-05-06 LAB — CBC
HCT: 24.7 % — ABNORMAL LOW (ref 36.0–46.0)
Hemoglobin: 8.2 g/dL — ABNORMAL LOW (ref 12.0–15.0)
MCH: 27.9 pg (ref 26.0–34.0)
MCHC: 33.2 g/dL (ref 30.0–36.0)
MCV: 84 fL (ref 78.0–100.0)
Platelets: 376 10*3/uL (ref 150–400)
RBC: 2.94 MIL/uL — ABNORMAL LOW (ref 3.87–5.11)
RDW: 16.5 % — ABNORMAL HIGH (ref 11.5–15.5)
WBC: 15.8 10*3/uL — ABNORMAL HIGH (ref 4.0–10.5)

## 2011-05-06 LAB — VANCOMYCIN, RANDOM: Vancomycin Rm: 25.7 ug/mL

## 2011-05-06 LAB — CREATININE, SERUM
Creatinine, Ser: 1.56 mg/dL — ABNORMAL HIGH (ref 0.50–1.10)
GFR calc Af Amer: 40 mL/min — ABNORMAL LOW (ref >90)
GFR calc non Af Amer: 35 mL/min — ABNORMAL LOW (ref >90)

## 2011-05-07 ENCOUNTER — Encounter: Payer: Self-pay | Admitting: Family Medicine

## 2011-05-07 LAB — CBC
HCT: 25.7 % — ABNORMAL LOW (ref 36.0–46.0)
Hemoglobin: 8.4 g/dL — ABNORMAL LOW (ref 12.0–15.0)
MCH: 27.8 pg (ref 26.0–34.0)
MCHC: 32.7 g/dL (ref 30.0–36.0)
MCV: 85.1 fL (ref 78.0–100.0)
Platelets: 377 10*3/uL (ref 150–400)
RBC: 3.02 MIL/uL — ABNORMAL LOW (ref 3.87–5.11)
RDW: 16.6 % — ABNORMAL HIGH (ref 11.5–15.5)
WBC: 15.2 10*3/uL — ABNORMAL HIGH (ref 4.0–10.5)

## 2011-05-07 LAB — BASIC METABOLIC PANEL WITH GFR
BUN: 20 mg/dL (ref 6–23)
CO2: 30 meq/L (ref 19–32)
Calcium: 8.4 mg/dL (ref 8.4–10.5)
Chloride: 104 meq/L (ref 96–112)
Creatinine, Ser: 1.64 mg/dL — ABNORMAL HIGH (ref 0.50–1.10)
GFR calc Af Amer: 38 mL/min — ABNORMAL LOW
GFR calc non Af Amer: 33 mL/min — ABNORMAL LOW
Glucose, Bld: 86 mg/dL (ref 70–99)
Potassium: 2.9 meq/L — ABNORMAL LOW (ref 3.5–5.1)
Sodium: 140 meq/L (ref 135–145)

## 2011-05-07 NOTE — Op Note (Signed)
[name], [name]                 ACCOUNT NO.:  [id number]  MEDICAL RECORD NO.:  [id number]  LOCATION:  [id number]                         FACILITY:  [facility]  PHYSICIAN:  Pietro Cassis. Alvan Dame, M.D.  DATE OF BIRTH:  03-Dec-1945  DATE OF PROCEDURE:  05/05/2011 DATE OF DISCHARGE:                              OPERATIVE REPORT   PREOPERATIVE DIAGNOSIS:  Status post incision and debridement of a right hip seroma followed by wound VAC application, here for wound VAC exchange due to difficulty on the floor to do this with the wound nurse team.  PROCEDURE: 1. Incisional debridement sharply of about 5 cm of skin and     subcutaneous tissue in the proximal portion of wound. 2. Irrigation of the wound with 3L normal saline solution. 3. Application of a large wound VAC skin dressing.  SURGEON:  Pietro Cassis. Alvan Dame, M.D.  ASSISTANT:  Surgical team.  ANESTHESIA:  General anesthetic.  COMPLICATION:  None.  SPECIMENS:  None.  INDICATION FOR PROCEDURE:  [name] is a 65 year old patient of mine with a history of a right total hip replacement complicated by periprosthetic fracture, subsequent open reduction and internal fixation complicated by a distal periprosthetic femur fracture and treated with open reduction and internal fixation complicated by seroma formation and wound drainage.  She was taken to the operating room approximately 5 days ago where she had a seroma evacuated with sharp debridement and irrigation.  A wound VAC was applied.  The plan was for her to have her VAC changed on the floor; however, due to difficulties with discomfort she was unable to tolerate this and subsequent plans were made for an operative debridement and VAC exchange.  Risks and benefits were discussed with she and her family.  Consent was obtained for the above.  PROCEDURE IN DETAIL:  Patient was brought to operative theater.  Once adequate anesthesia was established, she had already on vancomycin protocol.  She was  positioned to the left lateral decubitus position with right side up.  Her right hip area was prepped and draped in sterile fashion.  Time-out was performed identifying the patient, planned procedure, and extremity.  Once asleep and draped, I removed the VAC fairly easily.  I feel that the discomfort may have been an issue; however, removed similar to that of a Band-Aid with a smooth removal it would come out easier with minimal discomfort than long-term discomfort.  Following removal of the wound VAC, I was able to evaluate the wound. There was a significantly better appearance of this wound compared to previous adventures with her.  I did do a sharp debridement of skin and subcutaneous tissue, about 5 to 6 cm in the proximal aspect of the wound, some less viable tissue.  I then proceeded to evacuate some blood clots within the wound and then irrigated the wound with 3L of normal saline solution.  Once this was completed, I used a large VAC sponge again.  I actually removed a portion of the VAC sponge and put it into the more distal tracking segment and then laid the remainder of the wound VAC over top of this to provide suction to the whole unit.  The  dressing for the wound VAC was subsequently applied and hooked to [facility] with a good seal, no leakage.  Following placement of the wound VAC and dressing of the distal wound with gauze and Tegaderm, she was then extubated and brought to the recovery room in stable condition tolerating the procedure well.  PLANS:  Now include wound VAC change, hopefully Monday and Wednesday by the wound nurse prior to transfer to the nursing facility, where these wound dressing changes will continue.  She will be on a wound VAC until it seals up as well as on IV antibiotics until it seals up to protect the hardware and her previous surgeries from complications of infection.  At this point, there has been no concern for obvious infection.  The  prophylaxis and prevention of infection is of upmost concern or priority at this point.     Pietro Cassis Alvan Dame, M.D.     MDO/MEDQ  D:  05/05/2011  T:  05/06/2011  Job:  [id number]  Electronically Signed by Paralee Cancel M.D. on 05/07/2011 12:41:02 PM

## 2011-05-07 NOTE — Op Note (Signed)
[name], [name]                 ACCOUNT NO.:  [id number]  MEDICAL RECORD NO.:  [id number]  LOCATION:  [id number]                         FACILITY:  [facility]  PHYSICIAN:  Pietro Cassis. Alvan Dame, M.D.  DATE OF BIRTH:  07/31/1945  DATE OF PROCEDURE:  05/01/2011 DATE OF DISCHARGE:                              OPERATIVE REPORT   PREOPERATIVE DIAGNOSIS:  Postoperative right hip seroma with drainage in macerations skin edges.  POSTOPERATIVE DIAGNOSIS:  Postoperative right hip seroma with drainage in maceration of skin edges.  PROCEDURE:  Incision of greater than 15 cm with sharp debridement of skin, subcutaneous, and fat tissue with subsequent irrigation with 9 L of normal saline solution and then application of a large wound VAC into the large wound due to the macerated wound edges and nonhealing wound based on the recurrent seroma.  SURGEON:  Pietro Cassis. Alvan Dame, M.D.  ASSISTANT:  Jane Orleans, PA  ANESTHESIA:  General.  SPECIMENS:  None.  BLOOD LOSS:  About 250 cc.  COMPLICATION:  None.  INDICATION:  [name] is a 65 year old female,  patient of mine, with an index of the hip replacement this year, guided by a periprosthetic fracture and subsequent periprosthetic fracture with open reduction and internal fixation of her right hip.  She is a very large, obese woman with large lateral hip wound that had recurrence seromatous problems with persistent wound drainage.  She presented to the emergency room with persistent drainage and elevated white count.  No fevers, chills, or night sweats.  She was subsequently admitted with a plan for as above risks and benefits were discussed with she and her husband.  Surgical plan discussed.  Consent obtained.  PROCEDURE IN DETAIL:  The patient was brought into operative theater. Once adequate anesthesia, preoperative antibiotics, vancomycin administered in addition to ciprofloxacin due to a newly diagnosed urinary tract infection.  She was  positioned into the left lateral decubitus position with right side up.  The right lower extremity was then prepped and draped in sterile fashion.  Time-out was performed identifying the patient, planned procedure, and extremity.  There was the distal incision made about 4 cm in length immediately penetration subcutaneous tissue.  There was an evacuation of probably 300 cc of seromatous material that poured onto the floor as well as suctioned out.  I then made a large elliptical incision of macerated skin edges on the proximal aspect of her incision that was 15 cm or greater.  Following the sharp debridement of the subcutaneous tissues and fat, we evacuated the seroma and subsequently irrigated both the proximal and distal area where the wound which were connected all superficial to the iliotibial band with 9 L of normal saline solution.  Following this incisional debridement as well as the irrigation, I did reapproximate the distal wound using 2-0 nylon.  I then dressed this sterilely with Xeroform, dry gauze, and Tegaderm.  The large wound, I did not plan to close at all based on her recurrent seroma.  I felt that it was best application for wound healing at this point was to apply wound VAC.  A large wound VAC was then placed in the wound with good suction.  All of the dressings were removed.  The remaining of the wound was clean and dry during this procedure with no apparent other wound complications.  She was then extubated and brought to the recovery room in stable condition tolerating the procedure well.  Physician assistant [name], was present for the entirety of the case from preoperative positioning, lower extremity management as well as assistance with irrigation and debridement.     Pietro Cassis Alvan Dame, M.D.     MDO/MEDQ  D:  05/01/2011  T:  05/02/2011  Job:  [id number]  Electronically Signed by Paralee Cancel M.D. on 05/07/2011 12:40:57 PM

## 2011-05-08 ENCOUNTER — Ambulatory Visit: Payer: 59 | Admitting: Family Medicine

## 2011-05-08 LAB — BASIC METABOLIC PANEL
BUN: 18 mg/dL (ref 6–23)
CO2: 28 mEq/L (ref 19–32)
Calcium: 8.1 mg/dL — ABNORMAL LOW (ref 8.4–10.5)
Chloride: 105 mEq/L (ref 96–112)
Creatinine, Ser: 1.72 mg/dL — ABNORMAL HIGH (ref 0.50–1.10)
GFR calc Af Amer: 36 mL/min — ABNORMAL LOW (ref >90)
GFR calc non Af Amer: 31 mL/min — ABNORMAL LOW (ref >90)
Glucose, Bld: 78 mg/dL (ref 70–99)
Potassium: 2.9 mEq/L — ABNORMAL LOW (ref 3.5–5.1)
Sodium: 141 mEq/L (ref 135–145)

## 2011-05-08 LAB — VANCOMYCIN, TROUGH: Vancomycin Tr: 27.1 ug/mL (ref 10.0–20.0)

## 2011-05-09 LAB — CREATININE, SERUM
Creatinine, Ser: 1.56 mg/dL — ABNORMAL HIGH (ref 0.50–1.10)
GFR calc Af Amer: 40 mL/min — ABNORMAL LOW (ref >90)
GFR calc non Af Amer: 35 mL/min — ABNORMAL LOW (ref >90)

## 2011-05-14 NOTE — Discharge Summary (Signed)
NAMEANNISTON, KLEINKE                 ACCOUNT NO.:  [id number]  MEDICAL RECORD NO.:  [id number]  LOCATION:                               FACILITY:  [facility]  PHYSICIAN:  Danae Orleans, PA     DATE OF BIRTH:  1946-02-09  DATE OF ADMISSION:  04/16/2011 DATE OF DISCHARGE:  04/19/2011                        DISCHARGE SUMMARY - REFERRING   PROCEDURE:  Open reduction and internal fixation of right femur fracture with allograft.  ADMITTING DIAGNOSIS:  Recurrent periprosthetic fracture of the right hip.  DISCHARGE DIAGNOSES: 1. Status post open reduction and internal fixation of right femur     fracture with allograft. 2. Acute blood loss anemia. 3. Hypertension. 4. Chronic pain. 5. Gout. 6. History of cerebrovascular accident. 7. Sleep apnea. 8. Atrial fibrillation.  HISTORY OF PRESENT ILLNESS:  The patient is a 65 year old lady with a history of recent total hip arthroplasty on the right who sustained a periprosthetic fracture.  She underwent open cabling of her fracture. The patient then, after discharge to the nurse facility sustained another fall with pain and difficulty ambulating.  New x-rays did show new periprosthetic fracture distal to a cabling of the previous fracture.  She was seen in the office, she was then admitted to the hospital.  Risks, benefits, and expectations of procedure were discussed with the patient.  The patient understands the risks, benefits, and expectation and wished to proceed with an ORIF of the right femur fracture with an allograft.  HOSPITAL COURSE:  The patient underwent the above-stated procedure on April 16, 2011.  The patient tolerated the procedure well, was brought to the recovery room in good condition and subsequently to the floor.  On postop day #1, the patient did receive some blood while in the operating room, but the patient's hemoglobin and hematocrit were 8.8/28.0.  She is doing well.  No events.  Pain is well controlled.  She is  afebrile, vital signs stable.  Distally neurovascular intact. Dressings were good, clean, dry, and intact.  The patient had PT with 25% weightbearing.  On postop day #2, April 18, 2011, the patient doing well, no events. Pain is well controlled.  She is afebrile, vital signs stable.  She is distally neurovascular intact.  Dressing good, clean, dry, and intact. She had physical therapy.  The patient's hemoglobin and hematocrit were 6.9/21.1.  The patient was transfused with 2 units of packed red blood cells.  On postop day #3, April 19, 2011, the patient doing well, no events. Pain is well controlled, afebrile, vital signs stable.  The patient  receiving 2 units of packed red blood cells.  Dressings good, clean, dry, and intact.  She had physical therapy, occupational therapy, given 25% weightbearing.  New labs are to be obtained.  If these are good, it is felt that she is doing well enough to be discharged to a skilled nursing facility.  DISCHARGE CONDITION:  Good.  DISCHARGE INSTRUCTIONS:  The patient will be discharged to skilled nursing facility today.  The patient is to be 25% weightbearing for 8 weeks.  The patient should have daily dressing changes.  The patient is to keep the area dry and clean until followup.  She will follow up in 2 weeks at [facility].  The patient is to call with any questions or concerns.  DISCHARGE MEDICATIONS: 1. Norco 7.5/325 one to two p.o. q.4-6 hours p.r.n. pain. 2. Aspirin 325 mg one p.o. b.i.d. x4 weeks. 3. Benadryl 25 mg one p.o. q.4 hours p.r.n. 4. Cardizem 300 mg one p.o. q.a.m. 5. Colchicine 0.6 mg one p.o. b.i.d. 6. Docusate 100 mg one p.o. b.i.d. 7. Ferrous sulfate 325 mg one p.o. t.i.d. x2 to 3 weeks. 8. Hectorol 0.5 mcg one p.o. q.a.m. 9. K-Dur 20 mEq one p.o. q.a.m. 10.Lasix 80 mg two p.o. b.i.d. 11.Lopressor 100 mg one p.o. q.a.m. 12.Micardis 80 mg one p.o. q.h.s. 13.MiraLax 17 g p.o. b.i.d.  constipation. 14.Neurontin 300 mg 1 capsule three times a day. 15.Robaxin 500 mg one p.o. q.6 h. p.r.n. muscle spasms. 16.Sotalol 80 mg one p.o. b.i.d. 17.Zoloft 50 mg one p.o. q.a.m. 18.Allopurinol 300 mg one p.o. q.a.m.          ______________________________ Danae Orleans, PA     MB/MEDQ  D:  04/19/2011  T:  04/19/2011  Job:  [id number]  Electronically Signed by Danae Orleans PA on 05/01/2011 08:27:41 AM Electronically Signed by Paralee Cancel M.D. on 05/14/2011 09:14:50 AM

## 2011-05-17 NOTE — Discharge Summary (Signed)
  [name], [name]                 ACCOUNT NO.:  [id number]  MEDICAL RECORD NO.:  [id number]  LOCATION:  [id number]                         FACILITY:  [facility]  PHYSICIAN:  Pietro Cassis. Alvan Dame, M.D.  DATE OF BIRTH:  [birth year]  DATE OF ADMISSION:  04/30/2011 DATE OF DISCHARGE:  05/09/2011                        DISCHARGE SUMMARY - REFERRING   ADDENDUM:  Due to the wound care nurses being unable to remove the wound VAC at the patient's bedside, patient had to return to the operating room on May 05, 2011 delaying her discharge on May 04, 2011. The wound VAC changed at the OR smoothly and new wound VAC was used to replace the old.  Patient tolerated the procedure well, was brought to the recovery room in good condition and subsequently back to her room. Wound Care changed her wound VAC on May 07, 2011.  This was done without any difficulty and wound looked good.  Plan is for further wound VAC to be changed on October 24 due to having to have an evaluation having the wound VAC changed at [facility].  The wound will be changed to a normal saline dressing in the hospital on May 09, 2011.  She will then be transferred to [name] where they can evaluate and place a new wound VAC on the area today.  DISCHARGE INSTRUCTIONS:  The patient will continue to be touchdown weightbearing.  Patient will have her wound VAC changed today, May 09, 2011 at [facility].  She will additionally have wound VAC changes every Monday, Wednesday, Friday until the wound heals.  Patient will be on IV vancomycin until the wound heals.  This will be monitored and adjusted by the facility pharmacy.  In addition to other medications, instead of 5 days of Cipro, patient will only have 1 full day 2 doses of Cipro 500 one p.o. b.i.d. on May 09, 2011.  Patient will follow up at [facility] in 2 weeks.  Patient is to follow up with any questions or  concerns.    ______________________________ Danae Orleans, PA   ______________________________ Pietro Cassis. Alvan Dame, M.D.    MB/MEDQ  D:  05/09/2011  T:  05/09/2011  Job:  [id number]  Electronically Signed by Danae Orleans PA on 05/15/2011 02:15:00 PM Electronically Signed by Paralee Cancel M.D. on 05/17/2011 11:10:26 AM

## 2011-05-17 NOTE — Discharge Summary (Signed)
[name], [name]                 ACCOUNT NO.:  [id number]  MEDICAL RECORD NO.:  [id number]  LOCATION:  [id number]                         FACILITY:  [facility]  PHYSICIAN:  Pietro Cassis. Alvan Dame, M.D.  DATE OF BIRTH:  31-Oct-1945  DATE OF ADMISSION:  04/30/2011 DATE OF DISCHARGE:  05/04/2011                        DISCHARGE SUMMARY - REFERRING   PROCEDURE:  Incision with sharp debridement of skin subcutaneous and fatty tissue and with subsequent irrigation with 9 L of normal saline solution and application of large wound VAC into large wound due to the macerated wound edges and nonhealing wound based on the recurrent seroma.  ATTENDING PHYSICIAN:  Pietro Cassis. Alvan Dame, M.D.  ADMITTING DIAGNOSIS:  Postoperative right hip seroma with drainage and macerations of skin edges.  DISCHARGE DIAGNOSES: 1. Status post incision and drainage of right hip seroma with     application of wound VAC. 2. Hypertension. 3. Chronic pain. 4. Gout. 5. History of cerebrovascular accident. 6. Sleep apnea. 7. History of atrial fibrillation. 8. Occasional urinary incontinence.  HISTORY OF PRESENT ILLNESS:  The patient is a 65 year old lady with a history of recent total hip arthroplasty on the right who sustained a periprosthetic fracture.  She underwent cabling of the fracture.  The patient then fell again, sustaining a second periprosthetic fracture, which was fixed with cable strut and plate.  Patient underwent last procedure on April 16, 2011.  Patient has been doing well since that point.  Patient started having drainage and noticing her wound possibly opening.  She contacted the office where she was sent to the emergency room.  Patient was admitted from the emergency room.  She was seen by Dr.  Alvan Dame.  Options were discussed with the patient regarding the possible seroma of the right hip with the drainage.  Patient wished to proceed with surgery.  Risks, benefits, and expectations of procedure were discussed  with the patient.  The patient understands risks, benefits, and expectations and wishes to proceed with surgery.  HOSPITAL COURSE:  Patient underwent the above-stated procedure on May 01, 2011.  Patient tolerated the procedure well, was brought to the recovery room in good condition, and subsequently to the floor.  Postop day 1, May 02, 2011, the patient doing well, no events.  Pain is well-controlled with medicine.  Patient's H and H was 6.5/20.7, afebrile, vital signs stable.  Patient was given 3 units of packed red blood cells.  She is otherwise distally neurovascularly intact.  She was seen by Physical Therapy, to be touch-down weightbearing only.  Postop day 2, May 03, 2011, the patient doing well, no events. Hematocrit was 25.8.  Otherwise, patient doing well.  Postop day 3, May 04, 2011, the patient doing well, no events.  H and H 7.7/23.1.  She is afebrile, vital signs stable.  The patient will have her wound VAC changed today before discharge.  Patient's PICC line will also be placed today.  The patient is felt to be doing well enough to be discharged to [facility] today.  DISCHARGE CONDITION:  Good.  DISCHARGE INSTRUCTIONS:  The patient will be discharged to [facility] today, May 04, 2011.  The patient will continue  to be touch-down weightbearing.  Arrangements will be made for the patient to have wound VAC changes while at [facility].  Patient will continue to be on IV vancomycin until the wound heals.  This will be monitored and adjusted by the pharmacy facility.  Patient will follow up at [facility] in 2 weeks.  Patient is to call with any questions or concerns.  DISCHARGE MEDICATIONS: 1. Cipro 500 one p.o. b.i.d. times 5 days. 2. Benadryl 25 mg one p.o. q.4 hours p.r.n. 3. Vancomycin to be monitored and adjusted by the facility pharmacy. 4. Aspirin 325 mg one p.o. b.i.d. times 4 weeks. 5.  Cardizem one p.o. q.a.m. 6. Colchicine 0.6 mg one p.o. b.i.d. 7. Docusate 100 mg one p.o. b.i.d. constipation. 8. Iron sulfate 325 mg one p.o. t.i.d. 9. Hectorol 0.5 mcg one p.o. q.a.m. 10.K-Dur 20 mEq one p.o. q.a.m. 11.Lasix 80 mg two pills p.o. b.i.d. 12.Lopressor 100 mg one p.o. q.a.m. 13.Micardis 80 mg one p.o. q.h.s. 14.Neurontin 300 mg one p.o. t.i.d. 15.Norco 7.5/325 one to two p.o. q.4-6 hours p.r.n. pain. 16.Robaxin 500 mg one p.o. q.6 hours p.r.n. muscle spasms. 17.Sotalol 80 mg one p.o. b.i.d. 18.Zoloft 50 mg one p.o. q.a.m. 19.Allopurinol 300 mg one p.o. q.a.m.    ______________________________ Danae Orleans, PA   ______________________________ Pietro Cassis. Alvan Dame, M.D.    MB/MEDQ  D:  05/04/2011  T:  05/04/2011  Job:  [id number]  Electronically Signed by Danae Orleans PA on 05/15/2011 02:14:48 PM Electronically Signed by Paralee Cancel M.D. on 05/17/2011 11:10:22 AM

## 2011-05-18 NOTE — H&P (Signed)
[name], [name]                 ACCOUNT NO.:  [id number]  MEDICAL RECORD NO.:  [id number]  LOCATION:  [id number]                         FACILITY:  [facility]  PHYSICIAN:  Danae Orleans, PA     DATE OF BIRTH:  09-08-1945  DATE OF ADMISSION:  04/30/2011 DATE OF DISCHARGE:                             HISTORY & PHYSICAL   ADMISSION DIAGNOSIS:  Possible right hip seroma with drainage.  HISTORY OF PRESENT ILLNESS:  The patient is a 65 year old lady with a history of a recent total hip arthroplasty on the right, who sustained a periprosthetic fracture.  She underwent cabling of the fracture. Patient then fell again, sustaining a second periprostatic fracture, this was fixed with cables and strut and plate.  Patient underwent that procedure on April 16, 2011.  The patient had been doing well since that point.  Patient started having drainage and noticing her wound possibly opening.  She contact the office, who had told her to go to the emergency room, patient was admitted from there.  Patient was seen by Dr. Alvan Dame in the emergency room, he diagnosed with a possible seroma of the right hip with drainage.  Patient will be admitted with possible potential surgery on the 16th for drainage of the seroma.  PAST MEDICAL HISTORY:  Includes: 1. Hypertension. 2. Chronic pain. 3. Gout. 4. History of CVA. 5. Sleep apnea. 6. Previous history of AFib. 7. Occasional, urinary incontinence.  PAST SURGERIES:  Include: 1. Atrial septal defect repair at age 61. 2. Cholecystectomy. 3. Lap band exploratory lap. 4. Total hip arthroplasty. 5. ORIF of the periprosthetic fracture. 6. Second ORIF of periprosthetic fracture.  MEDICATIONS: 1. Norco 7.5/325 one to two p.o. q.4-6 hours p.r.n., pain. 2. Aspirin 325 mg 1 p.o. b.i.d. x4 weeks. 3. Benadryl 25 mg 1 p.o. q.4 hours p.r.n. 4. Cardizem 300 mg 1 p.o. q.a.m. 5. Colchicine 0.6 mg 1 p.o. b.i.d. 6. Docusate 100 mg 1 p.o. b.i.d. 7. Iron sulfate 325 mg 1  p.o. t.i.d. times 2 to 3 weeks. 8. Hectorol 0.5 mcg 1 p.o. q.a.m. 9. K-Dur 20 mEq 1 p.o. q.a.m. 10.Lasix 80 mg 2 p.o. b.i.d. 11.Lopressor 100 mg 1 p.o. q.a.m. 12.Micardis 80 mg 1 p.o. at bedtime. 13.MiraLax 17 g p.o. b.i.d., constipation. 14.Neurontin 300 mg 1 p.o. t.i.d. 15.Robaxin 500 mg 1 p.o. q.6 hours p.r.n., muscle spasms. 16.Sotalol 80 mg 1 p.o. b.i.d. 17.Zoloft 50 mg 1 p.o. q.a.m. 18.Allopurinol 300 mg 1 p.o. q.a.m. 19.Levaquin.  ALLERGIES:  The patient allergic to COUMADIN.  SOCIAL HISTORY:  Patient is married.  She is retired.  She does not smoke or drink.  PHYSICAL EXAMINATION:  GENERAL:  The patient is a 65 year old black female in no acute distress. VITAL SIGNS:  Stable. HEENT:  Pupils are equal, round, reactive to light and accommodation. Throat is clear. NECK:  Supple.  No JVD noted.  No lymphadenopathy noted. CARDIO:  Benign. RESPIRATORY:  Benign. NEURO:  The patient is oriented x3. ORTHO:  Pertaining to the right hip, patient does have an area of drainage on the right hip, does not appear to be infected.  Patient's staples, we did not pull them on the skin area around the possible  seroma.  Patient is distally neurovascularly intact.  IMPRESSION:  Possible seroma, possible surgical fixation.  PLAN:  The patient will be admitted to the hospital with a possible seroma of the right hip.  Patient will possibly have surgery on May 01, 2011, and be n.p.o. after midnight on the 15th.  Patient will start IV vancomycin and consider p.o./IV antibiotics for 2 to 4 weeks postoperatively.          ______________________________ Danae Orleans, PA     MB/MEDQ  D:  04/30/2011  T:  05/01/2011  Job:  [id number]  Electronically Signed by Danae Orleans PA on 05/01/2011 08:27:47 AM Electronically Signed by Paralee Cancel M.D. on 05/18/2011 04:08:37 PM

## 2011-06-06 ENCOUNTER — Inpatient Hospital Stay (HOSPITAL_COMMUNITY)
Admission: EM | Admit: 2011-06-06 | Discharge: 2011-06-11 | DRG: 812 | Disposition: A | Discharge status: Skilled Nursing Facility | Payer: Medicare Other | Attending: Internal Medicine | Admitting: Internal Medicine

## 2011-06-06 ENCOUNTER — Inpatient Hospital Stay (HOSPITAL_COMMUNITY): Payer: Medicare Other

## 2011-06-06 ENCOUNTER — Encounter (HOSPITAL_COMMUNITY): Payer: Self-pay | Admitting: Emergency Medicine

## 2011-06-06 DIAGNOSIS — D631 Anemia in chronic kidney disease: Principal | ICD-10-CM | POA: Diagnosis present

## 2011-06-06 DIAGNOSIS — D72829 Elevated white blood cell count, unspecified: Secondary | ICD-10-CM | POA: Diagnosis not present

## 2011-06-06 DIAGNOSIS — IMO0002 Reserved for concepts with insufficient information to code with codable children: Secondary | ICD-10-CM | POA: Diagnosis present

## 2011-06-06 DIAGNOSIS — N289 Disorder of kidney and ureter, unspecified: Secondary | ICD-10-CM

## 2011-06-06 DIAGNOSIS — Y831 Surgical operation with implant of artificial internal device as the cause of abnormal reaction of the patient, or of later complication, without mention of misadventure at the time of the procedure: Secondary | ICD-10-CM | POA: Diagnosis present

## 2011-06-06 DIAGNOSIS — E876 Hypokalemia: Secondary | ICD-10-CM | POA: Diagnosis present

## 2011-06-06 DIAGNOSIS — D649 Anemia, unspecified: Secondary | ICD-10-CM | POA: Diagnosis present

## 2011-06-06 DIAGNOSIS — R5381 Other malaise: Secondary | ICD-10-CM | POA: Diagnosis present

## 2011-06-06 DIAGNOSIS — T792XXA Traumatic secondary and recurrent hemorrhage and seroma, initial encounter: Secondary | ICD-10-CM | POA: Diagnosis present

## 2011-06-06 DIAGNOSIS — D509 Iron deficiency anemia, unspecified: Secondary | ICD-10-CM | POA: Diagnosis present

## 2011-06-06 DIAGNOSIS — R5383 Other fatigue: Secondary | ICD-10-CM | POA: Diagnosis present

## 2011-06-06 DIAGNOSIS — K922 Gastrointestinal hemorrhage, unspecified: Secondary | ICD-10-CM

## 2011-06-06 DIAGNOSIS — N179 Acute kidney failure, unspecified: Secondary | ICD-10-CM | POA: Diagnosis present

## 2011-06-06 DIAGNOSIS — N189 Chronic kidney disease, unspecified: Secondary | ICD-10-CM | POA: Diagnosis present

## 2011-06-06 DIAGNOSIS — E860 Dehydration: Secondary | ICD-10-CM

## 2011-06-06 DIAGNOSIS — Z8673 Personal history of transient ischemic attack (TIA), and cerebral infarction without residual deficits: Secondary | ICD-10-CM

## 2011-06-06 DIAGNOSIS — A0472 Enterocolitis due to Clostridium difficile, not specified as recurrent: Secondary | ICD-10-CM | POA: Diagnosis not present

## 2011-06-06 DIAGNOSIS — Z888 Allergy status to other drugs, medicaments and biological substances status: Secondary | ICD-10-CM

## 2011-06-06 DIAGNOSIS — N390 Urinary tract infection, site not specified: Secondary | ICD-10-CM | POA: Diagnosis present

## 2011-06-06 DIAGNOSIS — I4891 Unspecified atrial fibrillation: Secondary | ICD-10-CM

## 2011-06-06 DIAGNOSIS — M109 Gout, unspecified: Secondary | ICD-10-CM | POA: Diagnosis present

## 2011-06-06 DIAGNOSIS — I129 Hypertensive chronic kidney disease with stage 1 through stage 4 chronic kidney disease, or unspecified chronic kidney disease: Secondary | ICD-10-CM | POA: Diagnosis present

## 2011-06-06 HISTORY — DX: Acute kidney failure, unspecified: N17.9

## 2011-06-06 HISTORY — DX: Cerebral infarction, unspecified: I63.9

## 2011-06-06 HISTORY — DX: Nontraumatic intracerebral hemorrhage, unspecified: I61.9

## 2011-06-06 LAB — IRON AND TIBC
Iron: 12 ug/dL — ABNORMAL LOW (ref 42–135)
Saturation Ratios: 8 % — ABNORMAL LOW (ref 20–55)
TIBC: 155 ug/dL — ABNORMAL LOW (ref 250–470)
UIBC: 143 ug/dL (ref 125–400)

## 2011-06-06 LAB — CBC
HCT: 28.7 % — ABNORMAL LOW (ref 36.0–46.0)
HCT: 27 % — ABNORMAL LOW (ref 36.0–46.0)
Hemoglobin: 9.2 g/dL — ABNORMAL LOW (ref 12.0–15.0)
Hemoglobin: 8.7 g/dL — ABNORMAL LOW (ref 12.0–15.0)
MCH: 26.9 pg (ref 26.0–34.0)
MCH: 26.9 pg (ref 26.0–34.0)
MCHC: 32.1 g/dL (ref 30.0–36.0)
MCHC: 32.2 g/dL (ref 30.0–36.0)
MCV: 83.9 fL (ref 78.0–100.0)
MCV: 83.3 fL (ref 78.0–100.0)
Platelets: 284 10*3/uL (ref 150–400)
Platelets: 287 10*3/uL (ref 150–400)
RBC: 3.42 MIL/uL — ABNORMAL LOW (ref 3.87–5.11)
RBC: 3.24 MIL/uL — ABNORMAL LOW (ref 3.87–5.11)
RDW: 19 % — ABNORMAL HIGH (ref 11.5–15.5)
RDW: 18.9 % — ABNORMAL HIGH (ref 11.5–15.5)
WBC: 10 10*3/uL (ref 4.0–10.5)
WBC: 10.3 10*3/uL (ref 4.0–10.5)

## 2011-06-06 LAB — BASIC METABOLIC PANEL
BUN: 64 mg/dL — ABNORMAL HIGH (ref 6–23)
CO2: 26 mEq/L (ref 19–32)
Calcium: 8.8 mg/dL (ref 8.4–10.5)
Chloride: 102 mEq/L (ref 96–112)
Creatinine, Ser: 2 mg/dL — ABNORMAL HIGH (ref 0.50–1.10)
GFR calc Af Amer: 30 mL/min — ABNORMAL LOW (ref >90)
GFR calc non Af Amer: 26 mL/min — ABNORMAL LOW (ref >90)
Glucose, Bld: 87 mg/dL (ref 70–99)
Potassium: 2.8 mEq/L — ABNORMAL LOW (ref 3.5–5.1)
Sodium: 137 mEq/L (ref 135–145)

## 2011-06-06 LAB — COMPREHENSIVE METABOLIC PANEL
ALT: 16 U/L (ref 0–35)
ALT: 14 U/L (ref 0–35)
AST: 18 U/L (ref 0–37)
AST: 18 U/L (ref 0–37)
Albumin: 2.5 g/dL — ABNORMAL LOW (ref 3.5–5.2)
Albumin: 2.3 g/dL — ABNORMAL LOW (ref 3.5–5.2)
Alkaline Phosphatase: 167 U/L — ABNORMAL HIGH (ref 39–117)
Alkaline Phosphatase: 161 U/L — ABNORMAL HIGH (ref 39–117)
BUN: 71 mg/dL — ABNORMAL HIGH (ref 6–23)
BUN: 62 mg/dL — ABNORMAL HIGH (ref 6–23)
CO2: 25 mEq/L (ref 19–32)
CO2: 24 mEq/L (ref 19–32)
Calcium: 9.3 mg/dL (ref 8.4–10.5)
Calcium: 8.8 mg/dL (ref 8.4–10.5)
Chloride: 102 mEq/L (ref 96–112)
Chloride: 103 mEq/L (ref 96–112)
Creatinine, Ser: 2.2 mg/dL — ABNORMAL HIGH (ref 0.50–1.10)
Creatinine, Ser: 1.99 mg/dL — ABNORMAL HIGH (ref 0.50–1.10)
GFR calc Af Amer: 26 mL/min — ABNORMAL LOW (ref >90)
GFR calc Af Amer: 30 mL/min — ABNORMAL LOW (ref >90)
GFR calc non Af Amer: 23 mL/min — ABNORMAL LOW (ref >90)
GFR calc non Af Amer: 26 mL/min — ABNORMAL LOW (ref >90)
Glucose, Bld: 80 mg/dL (ref 70–99)
Glucose, Bld: 123 mg/dL — ABNORMAL HIGH (ref 70–99)
Potassium: 2.9 mEq/L — ABNORMAL LOW (ref 3.5–5.1)
Potassium: 2.8 mEq/L — ABNORMAL LOW (ref 3.5–5.1)
Sodium: 138 mEq/L (ref 135–145)
Sodium: 136 mEq/L (ref 135–145)
Total Bilirubin: 0.3 mg/dL (ref 0.3–1.2)
Total Bilirubin: 0.2 mg/dL — ABNORMAL LOW (ref 0.3–1.2)
Total Protein: 6.3 g/dL (ref 6.0–8.3)
Total Protein: 5.8 g/dL — ABNORMAL LOW (ref 6.0–8.3)

## 2011-06-06 LAB — DIFFERENTIAL
Basophils Absolute: 0.2 10*3/uL — ABNORMAL HIGH (ref 0.0–0.1)
Basophils Relative: 2 % — ABNORMAL HIGH (ref 0–1)
Eosinophils Absolute: 0.3 10*3/uL (ref 0.0–0.7)
Eosinophils Relative: 3 % (ref 0–5)
Lymphocytes Relative: 20 % (ref 12–46)
Lymphs Abs: 2 10*3/uL (ref 0.7–4.0)
Monocytes Absolute: 1.5 10*3/uL — ABNORMAL HIGH (ref 0.1–1.0)
Monocytes Relative: 15 % — ABNORMAL HIGH (ref 3–12)
Neutro Abs: 6 10*3/uL (ref 1.7–7.7)
Neutrophils Relative %: 60 % (ref 43–77)

## 2011-06-06 LAB — OCCULT BLOOD, POC DEVICE: Fecal Occult Bld: POSITIVE

## 2011-06-06 LAB — RETICULOCYTES
RBC.: 3.24 MIL/uL — ABNORMAL LOW (ref 3.87–5.11)
Retic Count, Absolute: 116.6 10*3/uL (ref 19.0–186.0)
Retic Ct Pct: 3.6 % — ABNORMAL HIGH (ref 0.4–3.1)

## 2011-06-06 LAB — PROTIME-INR
INR: 1.09 (ref 0.00–1.49)
Prothrombin Time: 14.3 seconds (ref 11.6–15.2)

## 2011-06-06 LAB — APTT: aPTT: 28 seconds (ref 24–37)

## 2011-06-06 LAB — FOLATE: Folate: 5.6 ng/mL

## 2011-06-06 LAB — VITAMIN B12: Vitamin B-12: 1326 pg/mL — ABNORMAL HIGH (ref 211–911)

## 2011-06-06 LAB — MAGNESIUM: Magnesium: 1.4 mg/dL — ABNORMAL LOW (ref 1.5–2.5)

## 2011-06-06 LAB — FERRITIN: Ferritin: 234 ng/mL (ref 10–291)

## 2011-06-06 LAB — PREPARE RBC (CROSSMATCH)

## 2011-06-06 IMAGING — CR DG CHEST 1V PORT
1 series · 1 of 1 positions shown · non-contrast
Comparison: Two-view chest x-ray [DATE] [HOSPITAL]
and [DATE] [HOSPITAL].

CLINICAL DATA: Bedside PICC placement.

PORTABLE CHEST - 1 VIEW [DATE]/[PHONE_NUMBER] hours:

[AP]
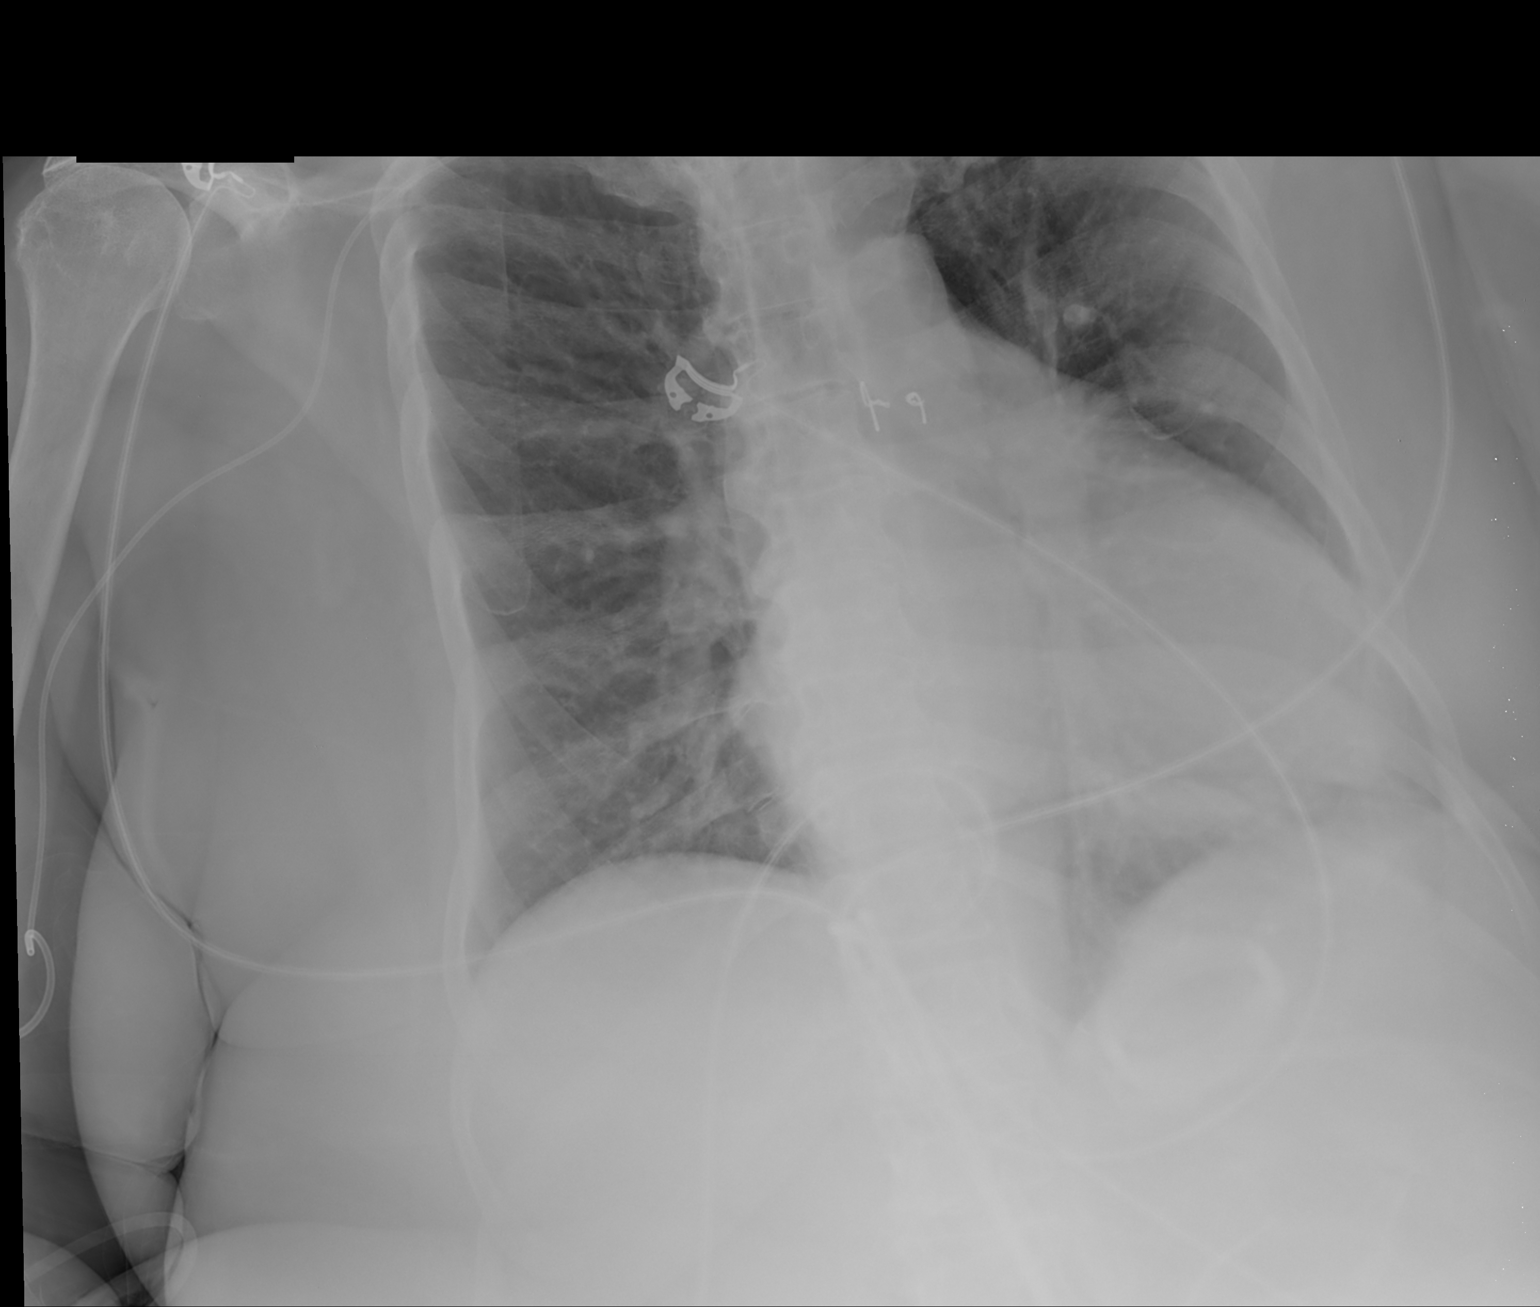

[1 of 1 positions shown; findings below may reference images not displayed]

FINDINGS: Right arm PICC tip in the mid SVC.  Patient rotated to
the left.  Cardiac silhouette enlarged but stable.  Pulmonary
venous hypertension without overt edema.  Focal airspace opacity in
the medial left lung base.  Lungs otherwise clear.  No visible
pleural effusions.
IMPRESSION: 1.  Right arm PICC tip in the mid SVC.
2.  Cardiomegaly.  Pulmonary venous hypertension without overt
edema.  Atelectasis versus pneumonia at the left lung base.

## 2011-06-06 MED ORDER — ONDANSETRON HCL 4 MG PO TABS: 4.0000 mg | ORAL_TABLET | Freq: Four times a day (QID) | ORAL | Status: DC | PRN

## 2011-06-06 MED ORDER — THERA M PLUS PO TABS
1.0000 | ORAL_TABLET | Freq: Every day | ORAL | Status: DC
  Administered 2011-06-07: 1 via ORAL
  Administered 2011-06-07: via ORAL
  Administered 2011-06-08: 1 via ORAL
  Administered 2011-06-09 – 2011-06-10 (×2): via ORAL
  Administered 2011-06-11: 1 via ORAL
  Filled 2011-06-06 (×7): qty 1

## 2011-06-06 MED ORDER — DIPHENHYDRAMINE HCL 25 MG PO TABS
25.0000 mg | ORAL_TABLET | Freq: Four times a day (QID) | ORAL | Status: DC | PRN
  Filled 2011-06-06: qty 1

## 2011-06-06 MED ORDER — POTASSIUM CHLORIDE CRYS ER 20 MEQ PO TBCR
40.0000 meq | EXTENDED_RELEASE_TABLET | Freq: Once | ORAL | Status: AC
  Administered 2011-06-06: 40 meq via ORAL
  Filled 2011-06-06: qty 1

## 2011-06-06 MED ORDER — ASPIRIN 325 MG PO TABS
325.0000 mg | ORAL_TABLET | Freq: Two times a day (BID) | ORAL | Status: DC
  Administered 2011-06-07 – 2011-06-11 (×10): 325 mg via ORAL
  Filled 2011-06-06 (×13): qty 1

## 2011-06-06 MED ORDER — GABAPENTIN 300 MG PO CAPS
300.0000 mg | ORAL_CAPSULE | Freq: Three times a day (TID) | ORAL | Status: DC
  Administered 2011-06-07 – 2011-06-11 (×15): 300 mg via ORAL
  Filled 2011-06-06 (×19): qty 1

## 2011-06-06 MED ORDER — SODIUM CHLORIDE 0.9 % IV SOLN
80.0000 mg | Freq: Once | INTRAVENOUS | Status: AC
  Administered 2011-06-06: 80 mg via INTRAVENOUS
  Filled 2011-06-06: qty 80

## 2011-06-06 MED ORDER — POTASSIUM CHLORIDE CRYS ER 20 MEQ PO TBCR
40.0000 meq | EXTENDED_RELEASE_TABLET | ORAL | Status: AC
  Administered 2011-06-07 (×2): 40 meq via ORAL
  Filled 2011-06-06 (×3): qty 2

## 2011-06-06 MED ORDER — SODIUM CHLORIDE 0.9 % IV SOLN
INTRAVENOUS | Status: DC
  Administered 2011-06-06 – 2011-06-07 (×2): via INTRAVENOUS

## 2011-06-06 MED ORDER — POTASSIUM CHLORIDE CRYS ER 20 MEQ PO TBCR
20.0000 meq | EXTENDED_RELEASE_TABLET | Freq: Every day | ORAL | Status: DC
  Administered 2011-06-08: 20 meq via ORAL
  Filled 2011-06-06 (×6): qty 1

## 2011-06-06 MED ORDER — NYSTATIN 100000 UNIT/GM EX POWD
Freq: Three times a day (TID) | CUTANEOUS | Status: DC
  Administered 2011-06-07 – 2011-06-11 (×13): via TOPICAL
  Filled 2011-06-06 (×2): qty 15

## 2011-06-06 MED ORDER — SODIUM CHLORIDE 0.9 % IV BOLUS (SEPSIS): 1000.0000 mL | Freq: Once | INTRAVENOUS | Status: DC

## 2011-06-06 MED ORDER — SODIUM CHLORIDE 0.9 % IV SOLN
1000.0000 mL | INTRAVENOUS | Status: AC
  Administered 2011-06-06: 1000 mL via INTRAVENOUS

## 2011-06-06 MED ORDER — METOPROLOL SUCCINATE ER 100 MG PO TB24
100.0000 mg | ORAL_TABLET | Freq: Every day | ORAL | Status: DC
  Administered 2011-06-07 – 2011-06-11 (×6): 100 mg via ORAL
  Filled 2011-06-06 (×7): qty 1

## 2011-06-06 MED ORDER — HYDROMORPHONE HCL PF 2 MG/ML IJ SOLN
INTRAMUSCULAR | Status: AC
  Administered 2011-06-06: 1 mg
  Filled 2011-06-06: qty 1

## 2011-06-06 MED ORDER — SOTALOL HCL 80 MG PO TABS
80.0000 mg | ORAL_TABLET | Freq: Two times a day (BID) | ORAL | Status: DC
  Administered 2011-06-07 – 2011-06-11 (×10): 80 mg via ORAL
  Filled 2011-06-06 (×13): qty 1

## 2011-06-06 MED ORDER — ONDANSETRON HCL 4 MG/2ML IJ SOLN: 4.0000 mg | Freq: Four times a day (QID) | INTRAMUSCULAR | Status: DC | PRN

## 2011-06-06 MED ORDER — SENNOSIDES-DOCUSATE SODIUM 8.6-50 MG PO TABS
1.0000 | ORAL_TABLET | Freq: Every day | ORAL | Status: DC | PRN
  Filled 2011-06-06: qty 1

## 2011-06-06 MED ORDER — DILTIAZEM HCL ER COATED BEADS 300 MG PO CP24
300.0000 mg | ORAL_CAPSULE | Freq: Every day | ORAL | Status: DC
  Administered 2011-06-07 – 2011-06-11 (×6): 300 mg via ORAL
  Filled 2011-06-06 (×7): qty 1

## 2011-06-06 MED ORDER — COLCHICINE 0.6 MG PO TABS
0.6000 mg | ORAL_TABLET | Freq: Two times a day (BID) | ORAL | Status: DC
  Administered 2011-06-07 (×2): 0.6 mg via ORAL
  Filled 2011-06-06 (×3): qty 1

## 2011-06-06 MED ORDER — MORPHINE SULFATE 2 MG/ML IJ SOLN: 1.0000 mg | INTRAMUSCULAR | Status: DC | PRN

## 2011-06-06 MED ORDER — FLUCONAZOLE 200 MG PO TABS
200.0000 mg | ORAL_TABLET | Freq: Every day | ORAL | Status: DC
  Administered 2011-06-07 (×2): 200 mg via ORAL
  Filled 2011-06-06 (×4): qty 1

## 2011-06-06 MED ORDER — HYDROCODONE-ACETAMINOPHEN 7.5-325 MG PO TABS
2.0000 | ORAL_TABLET | ORAL | Status: DC | PRN
  Administered 2011-06-07 – 2011-06-11 (×13): 2 via ORAL
  Filled 2011-06-06 (×3): qty 2
  Filled 2011-06-06 (×2): qty 1
  Filled 2011-06-06 (×9): qty 2

## 2011-06-06 MED ORDER — POTASSIUM CHLORIDE CRYS ER 20 MEQ PO TBCR: 40.0000 meq | EXTENDED_RELEASE_TABLET | ORAL | Status: DC

## 2011-06-06 MED ORDER — HYDROMORPHONE HCL PF 1 MG/ML IJ SOLN: 1.0000 mg | Freq: Once | INTRAMUSCULAR | Status: AC

## 2011-06-06 MED ORDER — ALLOPURINOL 300 MG PO TABS
300.0000 mg | ORAL_TABLET | Freq: Every day | ORAL | Status: DC
  Administered 2011-06-07 – 2011-06-11 (×6): 300 mg via ORAL
  Filled 2011-06-06 (×7): qty 1

## 2011-06-06 MED ORDER — SODIUM CHLORIDE 0.9 % IJ SOLN
5.0000 mL | Freq: Every evening | INTRAMUSCULAR | Status: DC
  Administered 2011-06-06 – 2011-06-09 (×3): 5 mL via INTRAVENOUS

## 2011-06-06 MED ORDER — FERROUS SULFATE 325 (65 FE) MG PO TABS
325.0000 mg | ORAL_TABLET | Freq: Three times a day (TID) | ORAL | Status: DC
  Administered 2011-06-07 – 2011-06-11 (×14): 325 mg via ORAL
  Filled 2011-06-06 (×18): qty 1

## 2011-06-06 MED ORDER — PROSOURCE NO CARB PO LIQD
30.0000 mL | Freq: Two times a day (BID) | ORAL | Status: DC
  Administered 2011-06-07 – 2011-06-10 (×6): 30 mL via ORAL
  Filled 2011-06-06 (×20): qty 30

## 2011-06-06 MED ORDER — SERTRALINE HCL 50 MG PO TABS
50.0000 mg | ORAL_TABLET | Freq: Every day | ORAL | Status: DC
  Administered 2011-06-07 – 2011-06-11 (×6): 50 mg via ORAL
  Filled 2011-06-06 (×7): qty 1

## 2011-06-06 MED ORDER — PANTOPRAZOLE SODIUM 40 MG IV SOLR
40.0000 mg | Freq: Two times a day (BID) | INTRAVENOUS | Status: DC
  Administered 2011-06-07 (×2): 40 mg via INTRAVENOUS
  Filled 2011-06-06 (×3): qty 40

## 2011-06-06 MED ORDER — DOXERCALCIFEROL 0.5 MCG PO CAPS
1.0000 ug | ORAL_CAPSULE | Freq: Every day | ORAL | Status: DC
  Administered 2011-06-07 – 2011-06-11 (×6): 1 ug via ORAL
  Filled 2011-06-06 (×7): qty 2

## 2011-06-06 MED ORDER — ALUM & MAG HYDROXIDE-SIMETH 200-200-20 MG/5ML PO SUSP: 30.0000 mL | Freq: Four times a day (QID) | ORAL | Status: DC | PRN

## 2011-06-06 MED ORDER — METHOCARBAMOL 500 MG PO TABS
500.0000 mg | ORAL_TABLET | Freq: Four times a day (QID) | ORAL | Status: DC
  Administered 2011-06-07 – 2011-06-11 (×19): 500 mg via ORAL
  Filled 2011-06-06 (×26): qty 1

## 2011-06-06 NOTE — ED Notes (Signed)
[id number] Expected date:06/06/11<BR> Expected time: 9:17 AM<BR> Means of arrival:Ambulance<BR> Comments:<BR> Low HGB

## 2011-06-06 NOTE — H&P (Signed)
[name] MRN: [id number] DOB/AGE: 1946/05/05 65 y.o. Primary Care Physician:Yvonne Lowne, DO, DO Admit date: 06/06/2011 Chief Complaint: Anemia HPI:  [name] is a 65 year old African American female who was at the skilled nursing facility sent to the emergency department secondary to anemia and for blood transfusion. Patient had recent hip surgery with a right hip wound and has been recovering from that with generalized weakness which is unchanged. Joint patient's last hospitalization in October she was transfused 3 units of packed) cells at that time she did have a hemoglobin of 6.5. Followup hemoglobin was 8.9 on November 13. Repeat hemoglobin was done one day prior to admission and per nursing home was down to 6.5 and a such patient was sent to the emergency room to be transfused. Patient denies any chest pain, denies shortness of breath, denies any change in her generalized weakness, denies any fever, no chills, no abdominal pain, no dysuria, no bright red blood per rectum, no hematochezia. Patient endorses dark stools however is on oral iron. Patient is also complaining of some skin breakdown between her legs. Patient also with decreased oral intake. Patient states that she was on a wound VAC at the facility however this was removed prior to her presenting to the ED and will need a wound VAC. Patient endorses decreased appetite and decreased oral intake. Patient was seen in the emergency room, CBC which was done did have a hemoglobin of 9.2 a.m. be met which was done did have a potassium of 2.9 and a creatinine level of 2.20 with a BUN of 70. We were called to admit the patient for further evaluation and management.  Past Medical History  Diagnosis Date  . Asthma   . Hypertension   . Depression   . Atrial fibrillation   . Fractures, stress     in both feet  . Arrhythmia   . Kidney disease     pt states creatinine is high  . 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 Stroke   .  Gout     Past Surgical History  Procedure Date  . Cholecystectomy   . Cardiac valve surgery   . Joint replacement     right hip and right femur x 2    Prior to Admission medications   Medication Sig Start Date End Date Taking? Authorizing Provider  acetaminophen (TYLENOL) 500 MG tablet Take 500 mg by mouth as needed.     Yes Historical Provider, MD  allopurinol (ZYLOPRIM) 100 MG tablet Take 300 mg by mouth daily. 3 tabs daily   Yes Historical Provider, MD  aspirin 325 MG tablet Take 325 mg by mouth 2 (two) times daily.     Yes Historical Provider, MD  colchicine 0.6 MG tablet Take 0.6 mg by mouth 2 (two) times daily.     Yes Historical Provider, MD  diltiazem (CARDIZEM CD) 300 MG 24 hr capsule Take 300 mg by mouth daily.     Yes Historical Provider, MD  diphenhydrAMINE (BENADRYL) 25 MG tablet Take 25 mg by mouth every 6 (six) hours as needed. Itching/rash    Yes Historical Provider, MD  docusate sodium (COLACE) 100 MG capsule Take 100 mg by mouth 2 (two) times daily.     Yes Historical Provider, MD  doxercalciferol (HECTOROL) 0.5 MCG capsule Take 1 mcg by mouth daily.     Yes Historical Provider, MD  ferrous sulfate 325 (65 FE) MG tablet Take 325 mg by mouth 3 (three) times daily with meals.  Yes Historical Provider, MD  furosemide (LASIX) 80 MG tablet Take 80 mg by mouth 2 (two) times daily. 2 tab in am and 1 tab    Yes Historical Provider, MD  gabapentin (NEURONTIN) 300 MG capsule Take 300 mg by mouth 3 (three) times daily.    Yes Historical Provider, MD  HYDROcodone-acetaminophen (NORCO) 7.5-325 MG per tablet Take 2 tablets by mouth every 4 (four) hours as needed. pain    Yes Historical Provider, MD  methocarbamol (ROBAXIN) 500 MG tablet Take 500 mg by mouth 4 (four) times daily.     Yes Historical Provider, MD  metoprolol (TOPROL XL) 100 MG 24 hr tablet Take 100 mg by mouth daily.     Yes Historical Provider, MD  Multiple Vitamins-Minerals (MULTIVITAMINS THER. W/MINERALS) TABS Take  1 tablet by mouth daily.     Yes Historical Provider, MD  potassium chloride SA (KLOR-CON M20) 20 MEQ tablet Take 20 mEq by mouth daily.     Yes Historical Provider, MD  protein supplement (PROSOURCE NO CARB) LIQD Take 30 mLs by mouth 2 (two) times daily.     Yes Historical Provider, MD  sertraline (ZOLOFT) 50 MG tablet Take 1 tablet (50 mg total) by mouth daily. 04/06/11  Yes Yvonne R Lowne, DO  sodium chloride 0.9 % injection Inject 5 mLs into the vein every evening.     Yes Historical Provider, MD  sotalol (BETAPACE) 80 MG tablet Take 80 mg by mouth 2 (two) times daily.     Yes Historical Provider, MD  telmisartan (MICARDIS) 80 MG tablet Take 80 mg by mouth daily.     Yes Historical Provider, MD    Allergies:  Allergies  Allergen Reactions  . Coumadin Other (See Comments)    Caused her to have a stroke    Family History  Problem Relation Age of Onset  . Diabetes Mother   . Hypertension Mother   . Hodgkin's lymphoma  32    dscd---HODGKINS DISEASE  . Cancer Father     Social History:  reports that she has never smoked. She has never used smokeless tobacco. She reports that she does not drink alcohol or use illicit drugs.  ROS: All systems reviewed with the patient and was positive as per HPI otherwise all other systems are negative.  PHYSICAL EXAM: Blood pressure 147/72, pulse 90, temperature 98.9 F (37.2 C), temperature source Oral, resp. rate 19, SpO2 94.00%. General: Alert, awake, oriented x3, in no acute distress.Obese. HEENT: [place]/AT.PERRLA.EOMI. No bruits, no goiter. Heart: Regular rate and rhythm, without murmurs, rubs, gallops. Lungs: Clear to auscultation bilaterally. Abdomen: Soft, nontender, nondistended, positive bowel sounds. Extremities: No clubbing cyanosis or edema with positive pedal pulses. Neuro: Alert and oriented x 3. CN II-XII Grossly intact, nonfocal. GU : Multiple areas of erupted blisters with flat circular lesions, and diffues whitish  ?exudate.   Ekg: Not done  No results found for this or any previous visit (from the past 240 hour(s)).   Lab results:  Basename 06/06/11 1100  NA 138  K 2.9*  CL 102  CO2 25  GLUCOSE 80  BUN 71*  CREATININE 2.20*  CALCIUM 9.3  MG --  PHOS --    Basename 06/06/11 1100  AST 18  ALT 16  ALKPHOS 167*  BILITOT 0.3  PROT 6.3  ALBUMIN 2.5*   No results found for this basename: LIPASE:2,AMYLASE:2 in the last 72 hours  Basename 06/06/11 1100  WBC 10.0  NEUTROABS 6.0  HGB 9.2*  HCT 28.7*  MCV 83.9  PLT 284   No results found for this basename: CKTOTAL:3,CKMB:3,CKMBINDEX:3,TROPONINI:3 in the last 72 hours No results found for this basename: POCBNP:3 in the last 72 hours No results found for this basename: DDIMER in the last 72 hours No results found for this basename: HGBA1C:2 in the last 72 hours No results found for this basename: CHOL:2,HDL:2,LDLCALC:2,TRIG:2,CHOLHDL:2,LDLDIRECT:2 in the last 72 hours No results found for this basename: TSH,T4TOTAL,FREET3,T3FREE,THYROIDAB in the last 72 hours No results found for this basename: VITAMINB12:2,FOLATE:2,FERRITIN:2,TIBC:2,IRON:2,RETICCTPCT:2 in the last 72 hours Imaging results:  No results found. Impression/Plan:  Principal Problem:  *Anemia Active Problems:  Hypokalemia  ARF (acute renal failure)   1. Anemia- questionable GI bleed. Hemoglobin is 9.2 today. FOBT was positive. BUN elevated. Check serial CBCs, and hydrate with IV fluids, place on proton pump inhibitor IV twice a day, check an anemia panel, consult with GI for further evaluation and recommendations. Transfusion threshold hemoglobin less than 7. 2. acute on chronic kidney disease- patient's face does have a chronic renal insufficiency is being followed by Dr. Jimmy Footman. Likely secondary to a prerenal azotemia versus renal versus post renal. Will check a fractional excretion of sodium, will check a UA with cultures and sensitivities, will hold patient's  Lasix and  ARB. Follow renal function. Gentle hydration with IV fluid. Patient and family is insistent on a renal consult as was told per her nephrologist at any time she was in the hospital they needed to be called and notified. If no improvement her renal function we'll get a renal ultrasound. 3. hypokalemia-likely secondary to diuretic use. Check a magnesium level. Replete. 4. R hip seroma s/p I and D with wound vac 04/30/2011- we'll consult wound care nurse to evaluate wound. Will order a wound VAC. Consult with orthopedics, Dr. Alvan Dame. ASA for anticoagulation. 5. HTN - continue beta blocker. Will hold ARB and Lasix secondary to problem #2. 6. atrial fibrillation- continue sotalol and beta blocker for rate control. Aspirin. 7. Gout - continue home regimen of allopurinol and colchicine. 8. ???Candida - nystatin powder. Wound care consult. Oral diflucan. 9. Prophylaxis- PPI for GI, scds for DVT.   THOMPSON,DANIEL 06/06/2011, 6:07 PM

## 2011-06-06 NOTE — ED Notes (Signed)
Report given to [facility]  Otila Kluver rn

## 2011-06-06 NOTE — ED Notes (Signed)
  Pt [name] saw Pcp 06/05/11  Blood was drawn. Was told to come to ED today for anemia

## 2011-06-06 NOTE — ED Provider Notes (Signed)
History     CSN: [id number] Arrival date & time: 06/06/2011 10:02 AM   First MD Initiated Contact with Patient 06/06/11 1122      No chief complaint on file. Anemia  (Consider location/radiation/quality/duration/timing/severity/associated sxs/prior treatment) HPI This 65 year old female is sent from a local rehabilitation center to the emergency room for blood transfusion. She is recent hip surgery with a right hip wound and is recovering from that with generalized weakness for the last month, she had anemia during her last hospitalization in October and had a transfusion of 3 units of red cells at that time for hemoglobin of 6.5, her followup hemoglobin is 8.9 on November 13, a repeat hemoglobin yesterday was back down to 6.5 with the results of that today so the patient was sent back to the emergency department for blood transfusion. The patient has no fever confusion chest pain shortness of breath abdominal pain or other concerns she has black stools from taking iron. There is no recent change in her right hip wound but no redness around the wound according to the patient. Past Medical History  Diagnosis Date  . Asthma   . Hypertension   . Depression   . Atrial fibrillation   . Fractures, stress     in both feet  . Arrhythmia   . Kidney disease     pt states creatinine is high  . 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 Stroke   . Gout    anemia, renal insufficiency  Past Surgical History  Procedure Date  . Cholecystectomy   . Cardiac valve surgery   . Joint replacement     right hip and right femur x 2    Family History  Problem Relation Age of Onset  . Diabetes Mother   . Hypertension Mother   . Hodgkin's lymphoma  32    dscd---HODGKINS DISEASE  . Cancer Father     History  Substance Use Topics  . Smoking status: Never Smoker   . Smokeless tobacco: Never Used  . Alcohol Use: No    OB History    Grav Para Term Preterm Abortions TAB SAB Ect Mult Living                   Review of Systems  Constitutional: Negative for fever.       10 Systems reviewed and are negative for acute change except as noted in the HPI.  HENT: Negative for congestion.   Eyes: Negative for discharge and redness.  Respiratory: Negative for cough and shortness of breath.   Cardiovascular: Negative for chest pain.  Gastrointestinal: Negative for vomiting and abdominal pain.  Musculoskeletal: Negative for back pain.  Skin: Negative for rash.  Neurological: Positive for weakness. Negative for syncope, numbness and headaches.  Psychiatric/Behavioral:       No behavior change.    Allergies  Coumadin  Home Medications   No current outpatient prescriptions on file.  BP 110/68  Pulse 87  Temp(Src) 98.4 F (36.9 C) (Oral)  Resp 18  Ht 5\' 6"  (1.676 m)  Wt 260 lb 12.9 oz (118.3 kg)  BMI 42.09 kg/m2  SpO2 93%  Physical Exam  Nursing note and vitals reviewed. Constitutional:       Awake, alert, nontoxic appearance.  HENT:  Head: Atraumatic.  Eyes: Right eye exhibits no discharge. Left eye exhibits no discharge.  Neck: Neck supple.  Cardiovascular: Normal rate.   No murmur heard. Pulmonary/Chest: Effort normal and breath sounds normal. No respiratory  distress. She has no wheezes. She has no rales. She exhibits no tenderness.  Abdominal: Soft. There is no tenderness. There is no rebound and no guarding.  Genitourinary:       Chaperone is present for a rectal examination which is nontender with black stool c/w iron with Hemoccult testing pending without the appearance of melena  Musculoskeletal: She exhibits no tenderness.       Baseline ROM, no obvious new focal weakness.  Right hip is baseline tenderness across the patient with a dressing over her chronic without surrounding erythema with localized unchanged tenderness as expected for the patient and capillary refill less than 2 seconds in both feet  Neurological:       Mental status and motor strength  appears baseline for patient and situation.  Skin: No rash noted.  Psychiatric: She has a normal mood and affect.    ED Course  Procedures (including critical care time) Pt stable in ED with no significant deterioration in condition.Patient / Family / Caregiver informed of clinical course, understand medical decision-making process, and agree with plan. Labs Reviewed  CBC - Abnormal; Notable for the following:    RBC 3.42 (*)    Hemoglobin 9.2 (*)    HCT 28.7 (*)    RDW 19.0 (*)    All other components within normal limits  DIFFERENTIAL - Abnormal; Notable for the following:    Monocytes Relative 15 (*)    Basophils Relative 2 (*)    Monocytes Absolute 1.5 (*)    Basophils Absolute 0.2 (*)    All other components within normal limits  COMPREHENSIVE METABOLIC PANEL - Abnormal; Notable for the following:    Potassium 2.9 (*)    BUN 71 (*)    Creatinine, Ser 2.20 (*)    Albumin 2.5 (*)    Alkaline Phosphatase 167 (*)    GFR calc non Af Amer 23 (*)    GFR calc Af Amer 26 (*)    All other components within normal limits  VITAMIN B12 - Abnormal; Notable for the following:    Vitamin B-12 1326 (*)    All other components within normal limits  IRON AND TIBC - Abnormal; Notable for the following:    Iron 12 (*)    TIBC 155 (*)    Saturation Ratios 8 (*)    All other components within normal limits  RETICULOCYTES - Abnormal; Notable for the following:    Retic Ct Pct 3.6 (*)    RBC. 3.24 (*)    All other components within normal limits  CBC - Abnormal; Notable for the following:    RBC 3.24 (*)    Hemoglobin 8.7 (*)    HCT 27.0 (*)    RDW 18.9 (*)    All other components within normal limits  BASIC METABOLIC PANEL - Abnormal; Notable for the following:    Potassium 2.8 (*)    BUN 64 (*)    Creatinine, Ser 2.00 (*)    GFR calc non Af Amer 26 (*)    GFR calc Af Amer 30 (*)    All other components within normal limits  COMPREHENSIVE METABOLIC PANEL - Abnormal; Notable for  the following:    Potassium 2.8 (*)    Glucose, Bld 123 (*)    BUN 62 (*)    Creatinine, Ser 1.99 (*)    Total Protein 5.8 (*)    Albumin 2.3 (*)    Alkaline Phosphatase 161 (*)    Total Bilirubin 0.2 (*)  GFR calc non Af Amer 26 (*)    GFR calc Af Amer 30 (*)    All other components within normal limits  URINALYSIS, ROUTINE W REFLEX MICROSCOPIC - Abnormal; Notable for the following:    Appearance TURBID (*)    Hgb urine dipstick SMALL (*)    Protein, ur 100 (*)    Nitrite POSITIVE (*)    Leukocytes, UA LARGE (*)    All other components within normal limits  BASIC METABOLIC PANEL - Abnormal; Notable for the following:    Potassium 3.2 (*)    BUN 58 (*)    Creatinine, Ser 1.96 (*)    GFR calc non Af Amer 26 (*)    GFR calc Af Amer 30 (*)    All other components within normal limits  CBC - Abnormal; Notable for the following:    WBC 11.3 (*)    RBC 3.11 (*)    Hemoglobin 8.4 (*)    HCT 25.8 (*)    RDW 19.3 (*)    All other components within normal limits  MAGNESIUM - Abnormal; Notable for the following:    Magnesium 1.4 (*)    All other components within normal limits  URINE MICROSCOPIC-ADD ON - Abnormal; Notable for the following:    Bacteria, UA MANY (*)    All other components within normal limits  TYPE AND SCREEN  PREPARE RBC (CROSSMATCH)  OCCULT BLOOD, POC DEVICE  FOLATE  FERRITIN  APTT  PROTIME-INR  SODIUM, URINE, RANDOM  CREATININE, URINE, RANDOM  MRSA PCR SCREENING  OCCULT BLOOD X 1 CARD TO LAB, STOOL   Dg Chest Port 1 View  06/06/2011  *RADIOLOGY REPORT*  Clinical Data: Bedside PICC placement.  PORTABLE CHEST - 1 VIEW 06/06/2011 2218 hours:  Comparison: Two-view chest x-ray 02/19/2011 [facility] and 01/07/2009 [facility].  Findings: Right arm PICC tip in the mid SVC.  Patient rotated to the left.  Cardiac silhouette enlarged but stable.  Pulmonary venous hypertension without overt edema.  Focal airspace opacity in the medial left lung  base.  Lungs otherwise clear.  No visible pleural effusions.  IMPRESSION:  1.  Right arm PICC tip in the mid SVC. 2.  Cardiomegaly.  Pulmonary venous hypertension without overt edema.  Atelectasis versus pneumonia at the left lung base.  Original Report Authenticated By: Deniece Portela, M.D.   Dg Hip Portable 1 View Right  06/07/2011  *RADIOLOGY REPORT*  Clinical Data: Postop femur fracture  PORTABLE RIGHT HIP - 1 VIEW  Comparison: None.  Findings: Stable right total hip arthroplasty with anatomic alignment of the colon.  The joint prosthetic components.  Plate and screws with cerclage wires remain in place in the femur transfixing a midshaft femur fracture.  The fracture is obscured.  IMPRESSION: ORIF femur fracture and total hip arthroplasty, with stable hardware.  Original Report Authenticated By: Jamas Lav, M.D.   Dg Femur Right Port  06/07/2011  *RADIOLOGY REPORT*  Clinical Data: Postop  PORTABLE RIGHT FEMUR - 2 VIEW  Comparison: 04/16/2011  Findings: Right total hip arthroplasty remains anatomic.  Plate and screws with cerclage wires transfixing a proximal diaphyseal femur fracture remain in place.  No breakage or loosening of the hardware.  Anatomic alignment of the femur.  There is been some interval healing at the fracture site. Osteopenia.  Severe osteoarthritic change of the knee.  IMPRESSION: Interval healing of the proximal femur fracture.  Hardware is stable.  Original Report Authenticated By: Jamas Lav, M.D.  1. GI bleed   2. Anemia   3. Renal insufficiency   4. Dehydration       MDM  Anemia better than expected but BUN elevated compared to prior and heme pos stool suggest GI bleed unknown timing.  Feel admit reasonable.  The patient appears reasonably stabilized for admission considering the current resources, flow, and capabilities available in the ED at this time, and I doubt any other [facility] requiring further screening and/or treatment in the ED prior to  admission.        Babette Relic, MD 06/07/11 [phone]

## 2011-06-07 ENCOUNTER — Inpatient Hospital Stay (HOSPITAL_COMMUNITY): Payer: Medicare Other

## 2011-06-07 DIAGNOSIS — R195 Other fecal abnormalities: Secondary | ICD-10-CM

## 2011-06-07 DIAGNOSIS — D509 Iron deficiency anemia, unspecified: Secondary | ICD-10-CM

## 2011-06-07 LAB — BASIC METABOLIC PANEL
BUN: 58 mg/dL — ABNORMAL HIGH (ref 6–23)
CO2: 25 mEq/L (ref 19–32)
Calcium: 8.6 mg/dL (ref 8.4–10.5)
Chloride: 107 mEq/L (ref 96–112)
Creatinine, Ser: 1.96 mg/dL — ABNORMAL HIGH (ref 0.50–1.10)
GFR calc Af Amer: 30 mL/min — ABNORMAL LOW (ref >90)
GFR calc non Af Amer: 26 mL/min — ABNORMAL LOW (ref >90)
Glucose, Bld: 89 mg/dL (ref 70–99)
Potassium: 3.2 mEq/L — ABNORMAL LOW (ref 3.5–5.1)
Sodium: 140 mEq/L (ref 135–145)

## 2011-06-07 LAB — CBC
HCT: 25.8 % — ABNORMAL LOW (ref 36.0–46.0)
Hemoglobin: 8.4 g/dL — ABNORMAL LOW (ref 12.0–15.0)
MCH: 27 pg (ref 26.0–34.0)
MCHC: 32.6 g/dL (ref 30.0–36.0)
MCV: 83 fL (ref 78.0–100.0)
Platelets: 336 10*3/uL (ref 150–400)
RBC: 3.11 MIL/uL — ABNORMAL LOW (ref 3.87–5.11)
RDW: 19.3 % — ABNORMAL HIGH (ref 11.5–15.5)
WBC: 11.3 10*3/uL — ABNORMAL HIGH (ref 4.0–10.5)

## 2011-06-07 LAB — URINE MICROSCOPIC-ADD ON

## 2011-06-07 LAB — URINALYSIS, ROUTINE W REFLEX MICROSCOPIC
Bilirubin Urine: NEGATIVE
Glucose, UA: NEGATIVE mg/dL
Ketones, ur: NEGATIVE mg/dL
Nitrite: POSITIVE — AB
Protein, ur: 100 mg/dL — AB
Specific Gravity, Urine: 1.012 (ref 1.005–1.030)
Urobilinogen, UA: 0.2 mg/dL (ref 0.0–1.0)
pH: 6 (ref 5.0–8.0)

## 2011-06-07 LAB — MRSA PCR SCREENING: MRSA by PCR: NEGATIVE

## 2011-06-07 LAB — SODIUM, URINE, RANDOM: Sodium, Ur: 31 mEq/L

## 2011-06-07 LAB — OCCULT BLOOD X 1 CARD TO LAB, STOOL: Fecal Occult Bld: NEGATIVE

## 2011-06-07 LAB — CREATININE, URINE, RANDOM: Creatinine, Urine: 64.5 mg/dL

## 2011-06-07 IMAGING — CR DG HIP 1V PORT*R*
1 series · 1 of 1 positions shown · non-contrast
Comparison: None.

CLINICAL DATA: Postop femur fracture

PORTABLE RIGHT HIP - 1 VIEW

[swimmers]
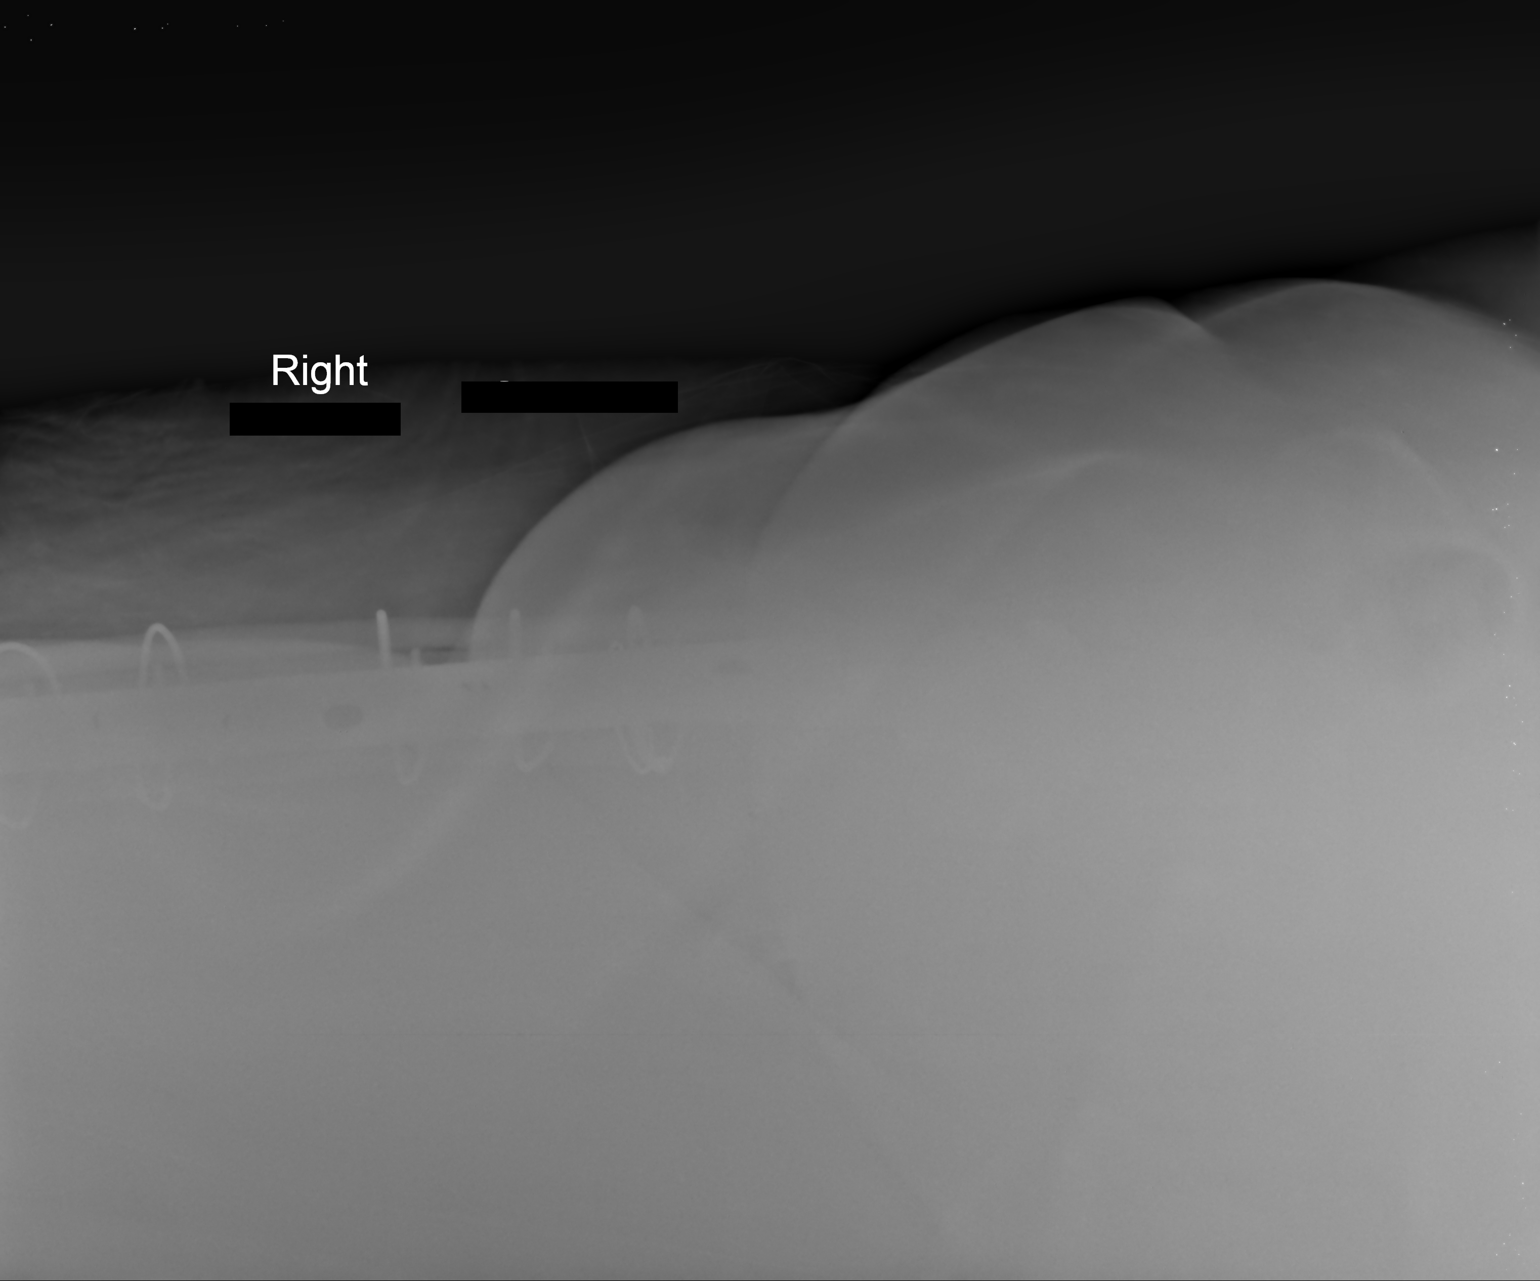

[1 of 1 positions shown; findings below may reference images not displayed]

FINDINGS: Stable right total hip arthroplasty with anatomic
alignment of the colon.  The joint prosthetic components.  Plate
and screws with cerclage wires remain in place in the femur
transfixing a midshaft femur fracture.  The fracture is obscured.
IMPRESSION: ORIF femur fracture and total hip arthroplasty, with stable
hardware.

## 2011-06-07 IMAGING — CR DG FEMUR 2+V PORT*R*
2 series · 3 of 3 positions shown · non-contrast
Comparison: [DATE]

CLINICAL DATA: Postop

PORTABLE RIGHT FEMUR - 2 VIEW

[Series 1: AP · right · 2 of 2 slices shown]
[im 1/2]
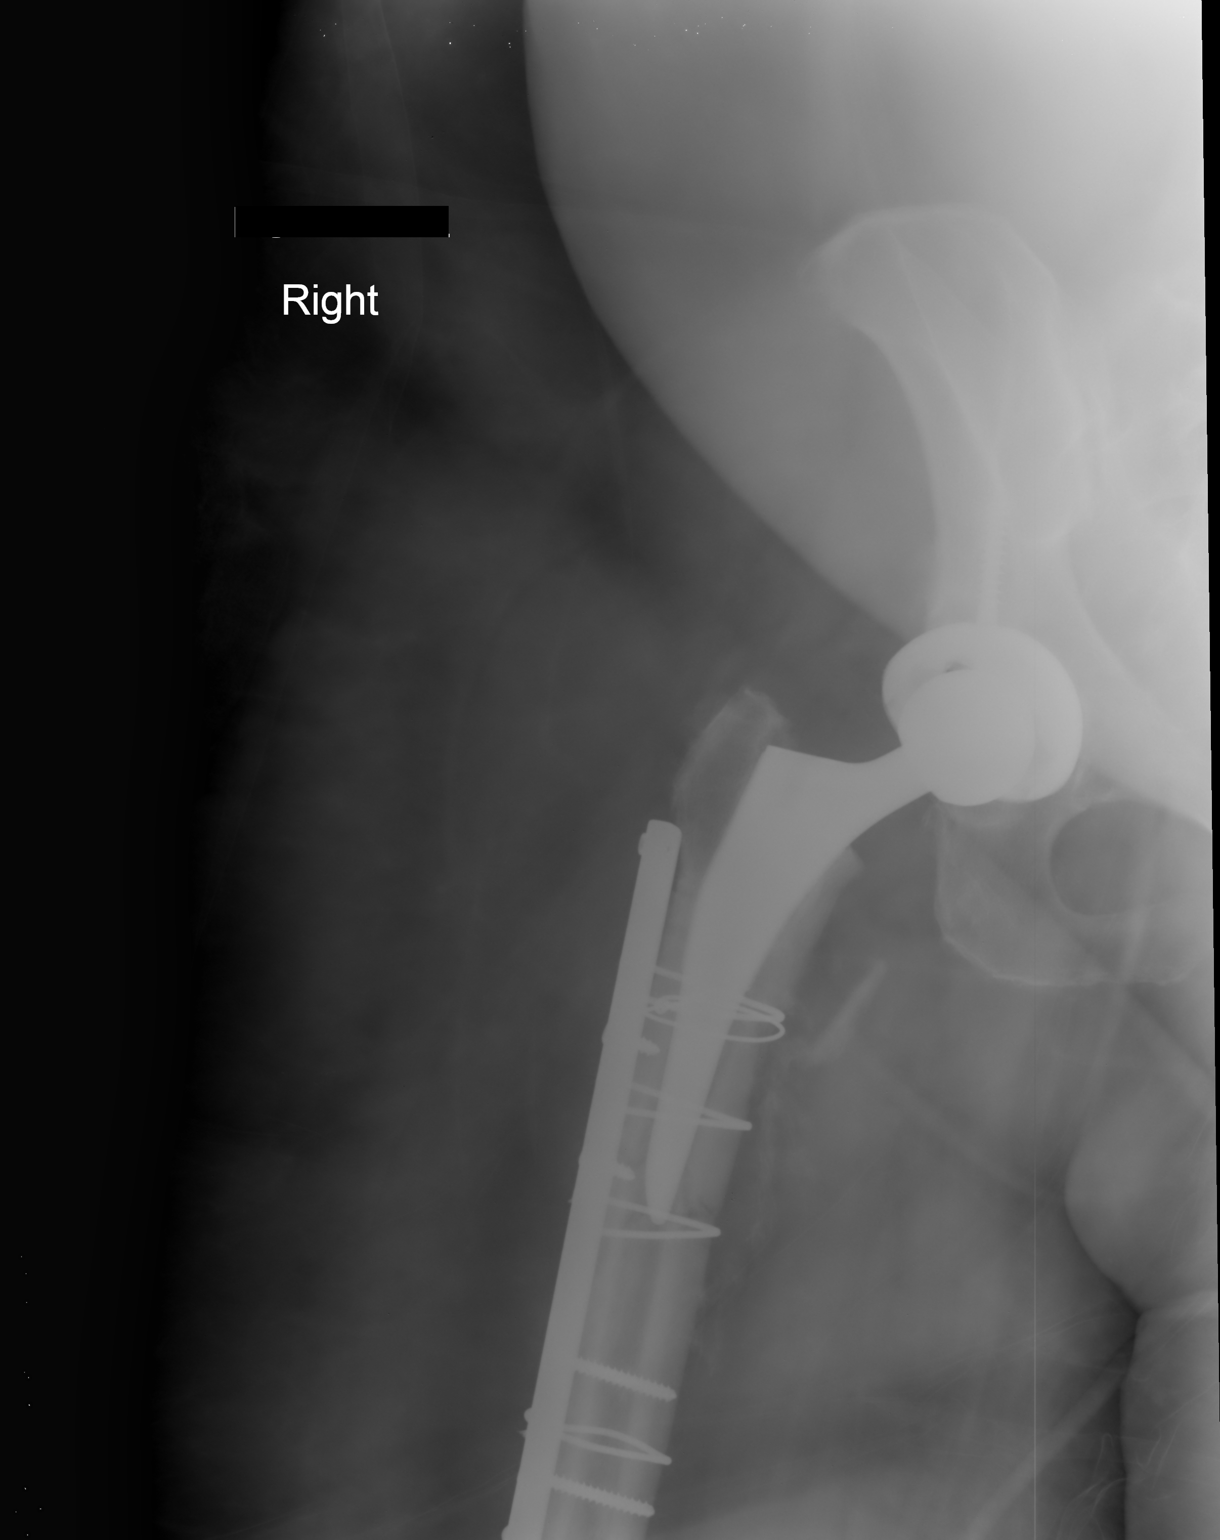
[im 2/2]
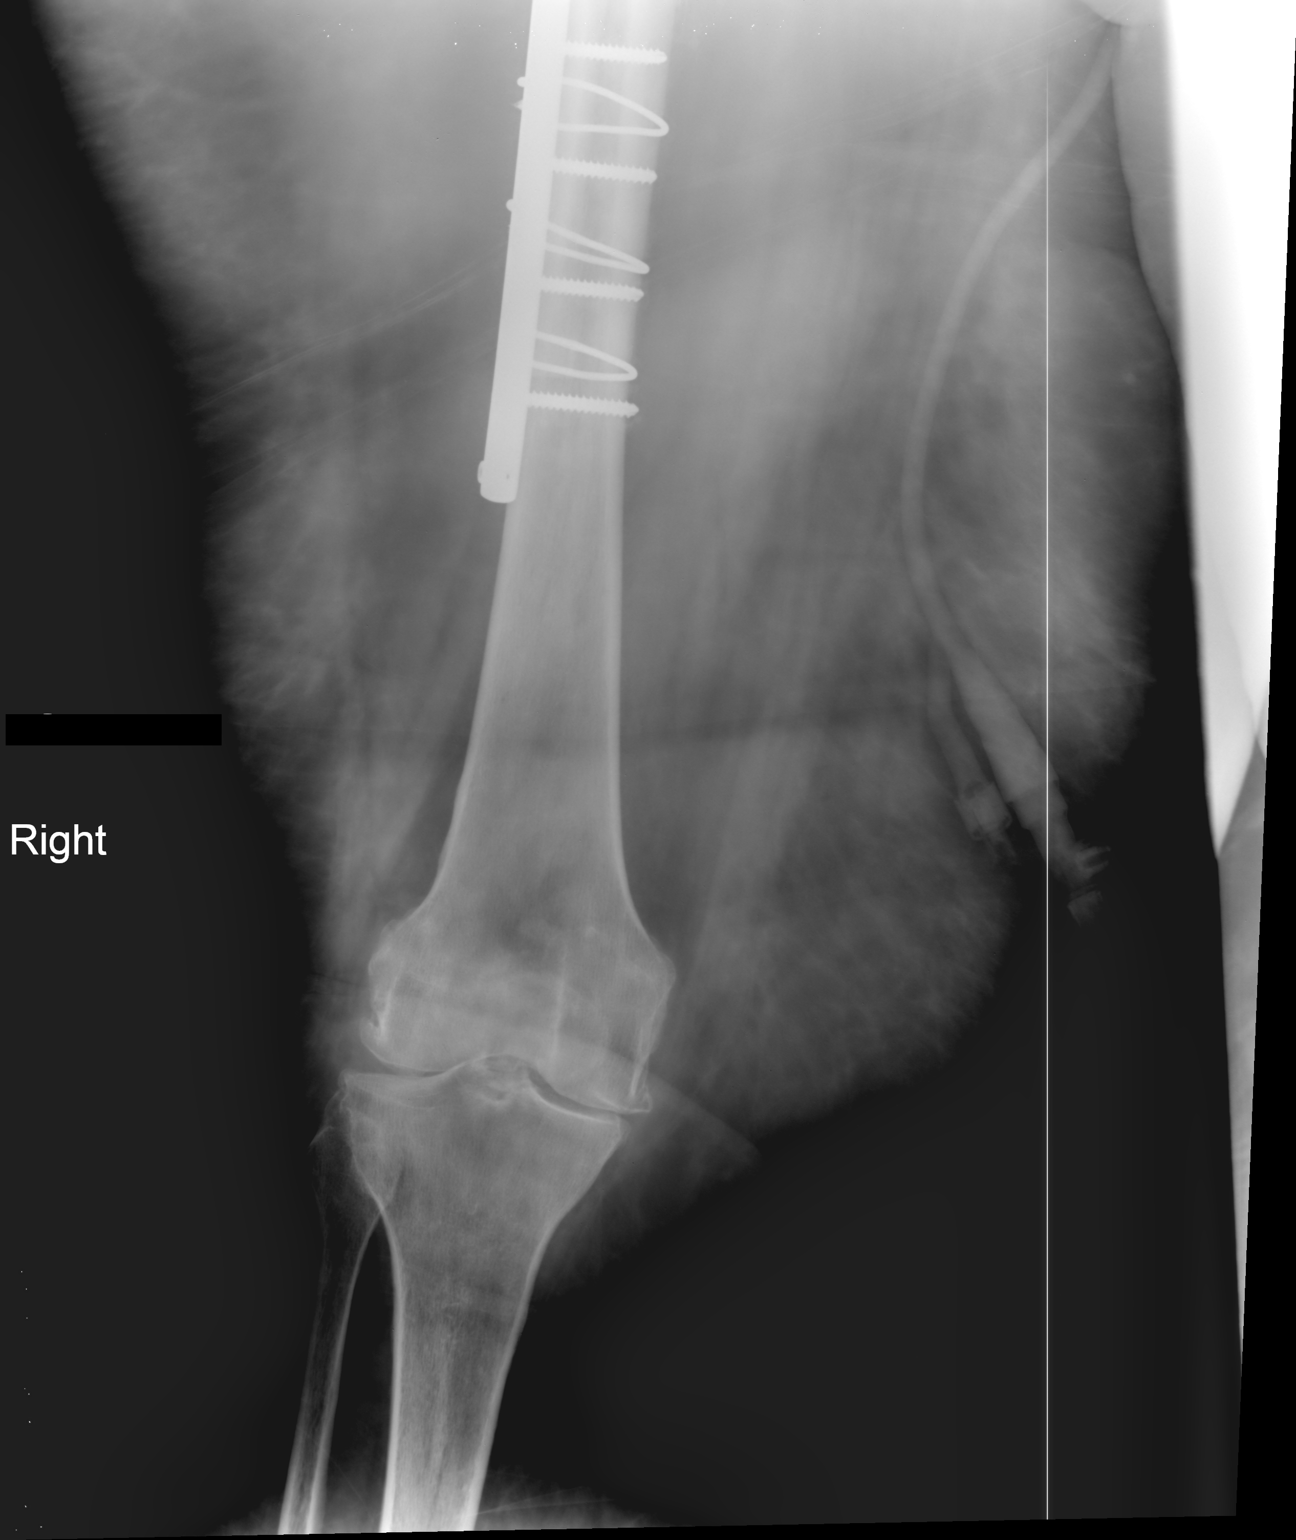

[lateral]
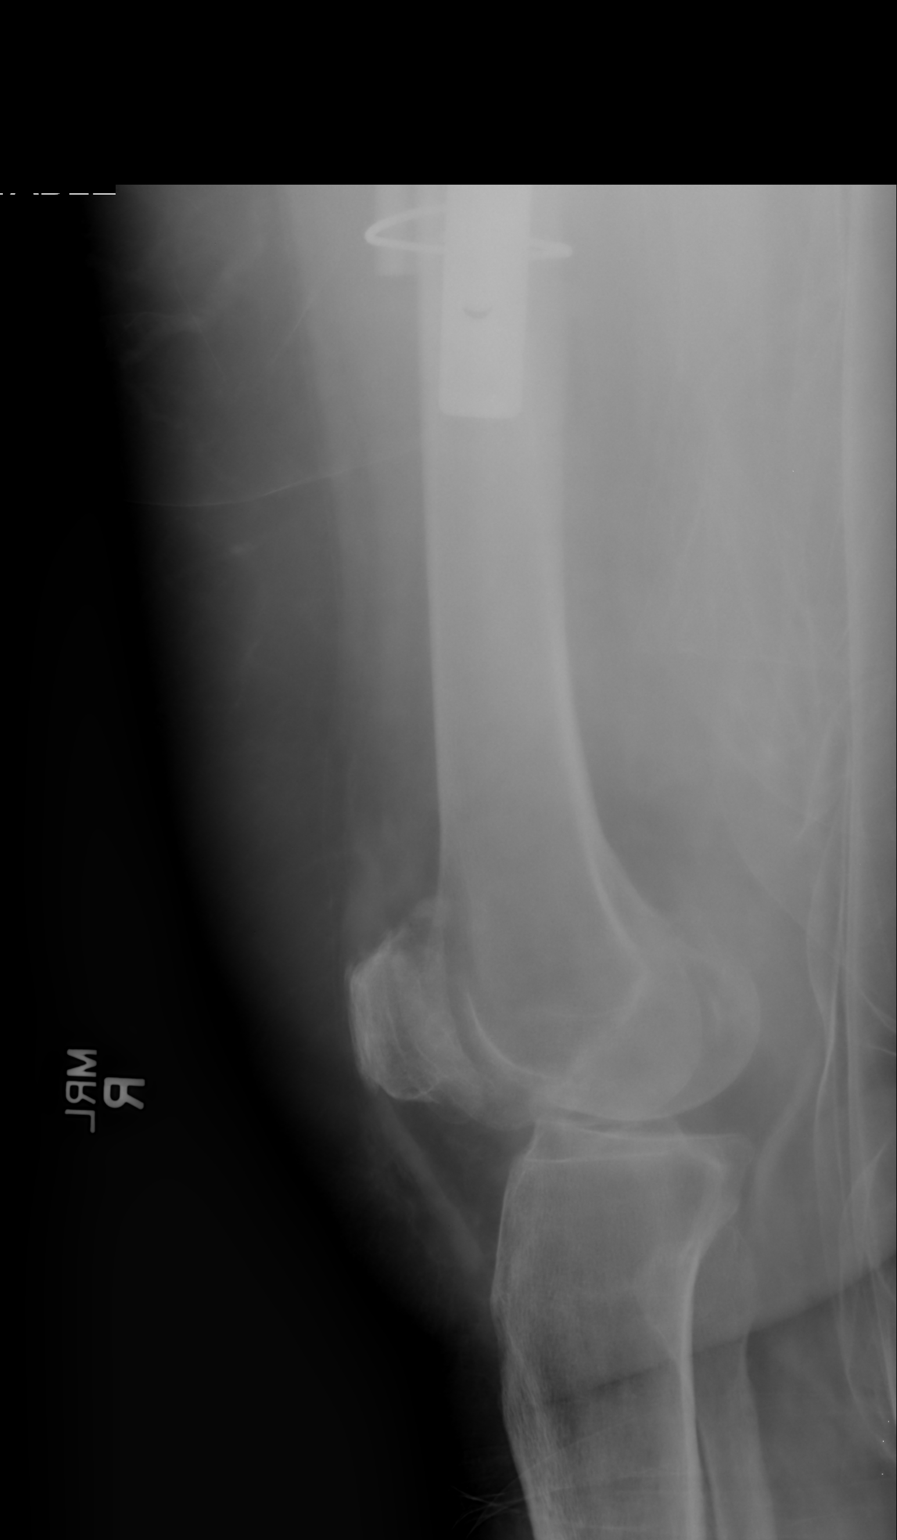

[3 of 3 positions shown; findings below may reference images not displayed]

FINDINGS: Right total hip arthroplasty remains anatomic.  Plate and
screws with cerclage wires transfixing a proximal diaphyseal femur
fracture remain in place.  No breakage or loosening of the
hardware.  Anatomic alignment of the femur.  There is been some
interval healing at the fracture site. Osteopenia.  Severe
osteoarthritic change of the knee.
IMPRESSION: Interval healing of the proximal femur fracture.  Hardware is
stable.

## 2011-06-07 MED ORDER — DEXTROSE 5 % IV SOLN
1.0000 g | INTRAVENOUS | Status: DC
  Administered 2011-06-07 – 2011-06-09 (×3): 1 g via INTRAVENOUS
  Administered 2011-06-10: 13:00:00 via INTRAVENOUS
  Administered 2011-06-11: 1 g via INTRAVENOUS
  Filled 2011-06-07 (×5): qty 10

## 2011-06-07 MED ORDER — DARBEPOETIN ALFA-POLYSORBATE 100 MCG/0.5ML IJ SOLN
100.0000 ug | Freq: Once | INTRAMUSCULAR | Status: AC
  Administered 2011-06-07: 100 ug via SUBCUTANEOUS
  Filled 2011-06-07: qty 0.5

## 2011-06-07 MED ORDER — POTASSIUM CHLORIDE CRYS ER 20 MEQ PO TBCR
40.0000 meq | EXTENDED_RELEASE_TABLET | ORAL | Status: AC
  Administered 2011-06-07: 40 meq via ORAL
  Filled 2011-06-07 (×2): qty 2

## 2011-06-07 MED ORDER — PANTOPRAZOLE SODIUM 40 MG PO TBEC
40.0000 mg | DELAYED_RELEASE_TABLET | Freq: Every day | ORAL | Status: DC
  Administered 2011-06-08 – 2011-06-11 (×4): 40 mg via ORAL
  Filled 2011-06-07 (×6): qty 1

## 2011-06-07 MED ORDER — FUROSEMIDE 80 MG PO TABS
80.0000 mg | ORAL_TABLET | Freq: Two times a day (BID) | ORAL | Status: DC
  Administered 2011-06-07 – 2011-06-11 (×8): 80 mg via ORAL
  Filled 2011-06-07 (×11): qty 1

## 2011-06-07 MED ORDER — POTASSIUM CHLORIDE CRYS ER 20 MEQ PO TBCR
40.0000 meq | EXTENDED_RELEASE_TABLET | Freq: Once | ORAL | Status: AC
  Administered 2011-06-07: 40 meq via ORAL
  Filled 2011-06-07: qty 2

## 2011-06-07 MED ORDER — COLCHICINE 0.6 MG PO TABS
0.6000 mg | ORAL_TABLET | Freq: Every day | ORAL | Status: DC
  Administered 2011-06-08 – 2011-06-11 (×4): 0.6 mg via ORAL
  Filled 2011-06-07 (×5): qty 1

## 2011-06-07 MED ORDER — FLUCONAZOLE 100 MG PO TABS
100.0000 mg | ORAL_TABLET | Freq: Every day | ORAL | Status: AC
  Administered 2011-06-08: 100 mg via ORAL
  Filled 2011-06-07: qty 1

## 2011-06-07 NOTE — Progress Notes (Signed)
Dr. Alvan Dame wanted placement of wound vac to right hip.  Patient had wound vac previously at rehab before arrival at the hospital.  [facility] nurse was paged multiple times today, but no one returned calls. Wound vac was placed per Dr. Aurea Graff order due to no [place] nurse.  Chancy Hurter, Hilary, and Hope helped to initiate [facility]. Wound measured at 15 cm by 11 cm. And 9 1/2 cm deep. Patient tolerated well.  Will continue to monitor patient.

## 2011-06-07 NOTE — Progress Notes (Signed)
Patient ID: [name], female   DOB: 07/29/1945, 65 y.o.   MRN: [id number]  Subjective: Doing fine, no major concerns.  [name] and her husband feel she is making good progress, ambulating 50% WB RLE at SNF  No reports of fevers or chills   Objective: Vital signs in last 24 hours: Temp:  [98.2 F (36.8 C)-99.2 F (37.3 C)] 98.4 F (36.9 C) (11/22 0505) Pulse Rate:  [66-96] 83  (11/22 0505) Resp:  [16-20] 18  (11/22 0505) BP: (102-147)/(8-74) 115/68 mmHg (11/22 0505) SpO2:  [93 %-100 %] 93 % (11/22 0505) Weight:  [118.3 kg (260 lb 12.9 oz)] 260 lb 12.9 oz (118.3 kg) (11/21 2050)   Basename 06/07/11 0510 06/06/11 1900 06/06/11 1100  HGB 8.4* 8.7* 9.2*    Basename 06/07/11 0510 06/06/11 1900  WBC 11.3* 10.3  RBC 3.11* 3.24*  HCT 25.8* 27.0*  PLT 336 287    Basename 06/07/11 0510 06/06/11 2130  NA 140 136  K 3.2* 2.8*  CL 107 103  CO2 25 24  BUN 58* 62*  CREATININE 1.96* 1.99*  GLUCOSE 89 123*  CALCIUM 8.6 8.8    Basename 06/06/11 2130  LABPT --  INR 1.09    Neurovascular intact Compartment soft RIght thigh wound 10-15cm, open with granulating bed some serous ooze, other wise improving.  Thigh otherwise soft no signs of infection  Assessment/Plan: PT- Partial WB 50% RLE until further directed  Place wound vac, orders in chart, Xrays ordered to reassess hip and femur   [name] 06/07/2011, 8:43 AM

## 2011-06-07 NOTE — Consult Note (Signed)
[facility] Renal Consultation Note  Requesting MD: Irine Seal, MD Indication for Consultation: A on CRF  HPI:  [name] is a 65 y.o. female PMhx CKD crt form 1.5-2 f/b Dr. Jimmy Footman.  She also is s/p recent hip surgery and was residing at a rehab facility.  She was sent to hospital last night for anemia with hbg in the 6's .  This was not found to be the case here as hgb was in the 9's.  However, patient was found to have A on CRF, crt 2.2, UTI and hypokalemia so was admitted for management.  All parameters improved and pt feels well today.  Regarding kidney function, lasix, micardis were held and patient started on rocephin for UTI   PMHx:   Past Medical History  Diagnosis Date  . Asthma   . Hypertension   . Depression   . Atrial fibrillation   . Fractures, stress     in both feet  . Arrhythmia   . Kidney disease     pt states creatinine is high  . 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 Stroke   . Gout     Past Surgical History  Procedure Date  . Cholecystectomy   . Cardiac valve surgery   . Joint replacement     right hip and right femur x 2    Family Hx:  Family History  Problem Relation Age of Onset  . Diabetes Mother   . Hypertension Mother   . Hodgkin's lymphoma  32    dscd---HODGKINS DISEASE  . Cancer Father     Social History:  reports that she has never smoked. She has never used smokeless tobacco. She reports that she does not drink alcohol or use illicit drugs.  Allergies:  Allergies  Allergen Reactions  . Coumadin Other (See Comments)    Caused her to have a stroke    Medications: Prior to Admission medications   Medication Sig Start Date End Date Taking? Authorizing Provider  acetaminophen (TYLENOL) 500 MG tablet Take 500 mg by mouth as needed.     Yes Historical Provider, MD  allopurinol (ZYLOPRIM) 100 MG tablet Take 300 mg by mouth daily. 3 tabs daily   Yes Historical Provider, MD  aspirin 325 MG tablet Take  325 mg by mouth 2 (two) times daily.     Yes Historical Provider, MD  colchicine 0.6 MG tablet Take 0.6 mg by mouth 2 (two) times daily.     Yes Historical Provider, MD  diltiazem (CARDIZEM CD) 300 MG 24 hr capsule Take 300 mg by mouth daily.     Yes Historical Provider, MD  diphenhydrAMINE (BENADRYL) 25 MG tablet Take 25 mg by mouth every 6 (six) hours as needed. Itching/rash    Yes Historical Provider, MD  docusate sodium (COLACE) 100 MG capsule Take 100 mg by mouth 2 (two) times daily.     Yes Historical Provider, MD  doxercalciferol (HECTOROL) 0.5 MCG capsule Take 1 mcg by mouth daily.     Yes Historical Provider, MD  ferrous sulfate 325 (65 FE) MG tablet Take 325 mg by mouth 3 (three) times daily with meals.     Yes Historical Provider, MD  furosemide (LASIX) 80 MG tablet Take 80 mg by mouth 2 (two) times daily. 2 tab in am and 1 tab    Yes Historical Provider, MD  gabapentin (NEURONTIN) 300 MG capsule Take 300 mg by mouth 3 (three) times daily.    Yes Historical  Provider, MD  HYDROcodone-acetaminophen (NORCO) 7.5-325 MG per tablet Take 2 tablets by mouth every 4 (four) hours as needed. pain    Yes Historical Provider, MD  methocarbamol (ROBAXIN) 500 MG tablet Take 500 mg by mouth 4 (four) times daily.     Yes Historical Provider, MD  metoprolol (TOPROL XL) 100 MG 24 hr tablet Take 100 mg by mouth daily.     Yes Historical Provider, MD  Multiple Vitamins-Minerals (MULTIVITAMINS THER. W/MINERALS) TABS Take 1 tablet by mouth daily.     Yes Historical Provider, MD  potassium chloride SA (KLOR-CON M20) 20 MEQ tablet Take 20 mEq by mouth daily.     Yes Historical Provider, MD  protein supplement (PROSOURCE NO CARB) LIQD Take 30 mLs by mouth 2 (two) times daily.     Yes Historical Provider, MD  sertraline (ZOLOFT) 50 MG tablet Take 1 tablet (50 mg total) by mouth daily. 04/06/11  Yes Yvonne R Lowne, DO  sodium chloride 0.9 % injection Inject 5 mLs into the vein every evening.     Yes Historical  Provider, MD  sotalol (BETAPACE) 80 MG tablet Take 80 mg by mouth 2 (two) times daily.     Yes Historical Provider, MD  telmisartan (MICARDIS) 80 MG tablet Take 80 mg by mouth daily.     Yes Historical Provider, MD    I have reviewed the patient's current medications.  Labs:  Results for orders placed during the hospital encounter of 06/06/11 (from the past 48 hour(s))  CBC     Status: Abnormal   Collection Time   06/06/11 11:00 AM      Component Value Range Comment   WBC 10.0  4.0 - 10.5 (K/uL)    RBC 3.42 (*) 3.87 - 5.11 (MIL/uL)    Hemoglobin 9.2 (*) 12.0 - 15.0 (g/dL)    HCT 28.7 (*) 36.0 - 46.0 (%)    MCV 83.9  78.0 - 100.0 (fL)    MCH 26.9  26.0 - 34.0 (pg)    MCHC 32.1  30.0 - 36.0 (g/dL)    RDW 19.0 (*) 11.5 - 15.5 (%)    Platelets 284  150 - 400 (K/uL)   DIFFERENTIAL     Status: Abnormal   Collection Time   06/06/11 11:00 AM      Component Value Range Comment   Neutrophils Relative 60  43 - 77 (%)    Lymphocytes Relative 20  12 - 46 (%)    Monocytes Relative 15 (*) 3 - 12 (%)    Eosinophils Relative 3  0 - 5 (%)    Basophils Relative 2 (*) 0 - 1 (%)    Neutro Abs 6.0  1.7 - 7.7 (K/uL)    Lymphs Abs 2.0  0.7 - 4.0 (K/uL)    Monocytes Absolute 1.5 (*) 0.1 - 1.0 (K/uL)    Eosinophils Absolute 0.3  0.0 - 0.7 (K/uL)    Basophils Absolute 0.2 (*) 0.0 - 0.1 (K/uL)    RBC Morphology POLYCHROMASIA PRESENT      WBC Morphology TOXIC GRANULATION     COMPREHENSIVE METABOLIC PANEL     Status: Abnormal   Collection Time   06/06/11 11:00 AM      Component Value Range Comment   Sodium 138  135 - 145 (mEq/L)    Potassium 2.9 (*) 3.5 - 5.1 (mEq/L)    Chloride 102  96 - 112 (mEq/L)    CO2 25  19 - 32 (mEq/L)    Glucose, Bld  80  70 - 99 (mg/dL)    BUN 71 (*) 6 - 23 (mg/dL)    Creatinine, Ser 2.20 (*) 0.50 - 1.10 (mg/dL)    Calcium 9.3  8.4 - 10.5 (mg/dL)    Total Protein 6.3  6.0 - 8.3 (g/dL)    Albumin 2.5 (*) 3.5 - 5.2 (g/dL)    AST 18  0 - 37 (U/L)    ALT 16  0 - 35 (U/L)      Alkaline Phosphatase 167 (*) 39 - 117 (U/L)    Total Bilirubin 0.3  0.3 - 1.2 (mg/dL)    GFR calc non Af Amer 23 (*) >90 (mL/min)    GFR calc Af Amer 26 (*) >90 (mL/min)   TYPE AND SCREEN     Status: Normal (Preliminary result)   Collection Time   06/06/11 11:00 AM      Component Value Range Comment   ABO/RH(D) O POS      Antibody Screen NEG      Sample Expiration 06/09/2011      Unit Number [id number]      Blood Component Type RED CELLS,LR      Unit division 00      Status of Unit ALLOCATED      Transfusion Status OK TO TRANSFUSE      Crossmatch Result Compatible      Unit Number [id number]      Blood Component Type RED CELLS,LR      Unit division 00      Status of Unit ALLOCATED      Transfusion Status OK TO TRANSFUSE      Crossmatch Result Compatible     PREPARE RBC (CROSSMATCH)     Status: Normal   Collection Time   06/06/11 11:00 AM      Component Value Range Comment   Order Confirmation ORDER PROCESSED BY BLOOD BANK     OCCULT BLOOD, POC DEVICE     Status: Normal   Collection Time   06/06/11 11:52 AM      Component Value Range Comment   Fecal Occult Bld POSITIVE     VITAMIN B12     Status: Abnormal   Collection Time   06/06/11  7:00 PM      Component Value Range Comment   Vitamin B-12 1326 (*) 211 - 911 (pg/mL)   FOLATE     Status: Normal   Collection Time   06/06/11  7:00 PM      Component Value Range Comment   Folate 5.6     IRON AND TIBC     Status: Abnormal   Collection Time   06/06/11  7:00 PM      Component Value Range Comment   Iron 12 (*) 42 - 135 (ug/dL)    TIBC 155 (*) 250 - 470 (ug/dL)    Saturation Ratios 8 (*) 20 - 55 (%)    UIBC 143  125 - 400 (ug/dL)   FERRITIN     Status: Normal   Collection Time   06/06/11  7:00 PM      Component Value Range Comment   Ferritin 234  10 - 291 (ng/mL)   RETICULOCYTES     Status: Abnormal   Collection Time   06/06/11  7:00 PM      Component Value Range Comment   Retic Ct Pct 3.6 (*) 0.4 - 3.1 (%)    RBC.  3.24 (*) 3.87 - 5.11 (MIL/uL)    Retic Count, Manual 116.6  19.0 - 186.0 (K/uL)   CBC     Status: Abnormal   Collection Time   06/06/11  7:00 PM      Component Value Range Comment   WBC 10.3  4.0 - 10.5 (K/uL) WHITE COUNT CONFIRMED ON SMEAR   RBC 3.24 (*) 3.87 - 5.11 (MIL/uL)    Hemoglobin 8.7 (*) 12.0 - 15.0 (g/dL)    HCT 27.0 (*) 36.0 - 46.0 (%)    MCV 83.3  78.0 - 100.0 (fL)    MCH 26.9  26.0 - 34.0 (pg)    MCHC 32.2  30.0 - 36.0 (g/dL)    RDW 18.9 (*) 11.5 - 15.5 (%)    Platelets 287  150 - 400 (K/uL) PLATELET COUNT CONFIRMED BY SMEAR  BASIC METABOLIC PANEL     Status: Abnormal   Collection Time   06/06/11  7:00 PM      Component Value Range Comment   Sodium 137  135 - 145 (mEq/L)    Potassium 2.8 (*) 3.5 - 5.1 (mEq/L)    Chloride 102  96 - 112 (mEq/L)    CO2 26  19 - 32 (mEq/L)    Glucose, Bld 87  70 - 99 (mg/dL)    BUN 64 (*) 6 - 23 (mg/dL)    Creatinine, Ser 2.00 (*) 0.50 - 1.10 (mg/dL)    Calcium 8.8  8.4 - 10.5 (mg/dL)    GFR calc non Af Amer 26 (*) >90 (mL/min)    GFR calc Af Amer 30 (*) >90 (mL/min)   COMPREHENSIVE METABOLIC PANEL     Status: Abnormal   Collection Time   06/06/11  9:30 PM      Component Value Range Comment   Sodium 136  135 - 145 (mEq/L)    Potassium 2.8 (*) 3.5 - 5.1 (mEq/L)    Chloride 103  96 - 112 (mEq/L)    CO2 24  19 - 32 (mEq/L)    Glucose, Bld 123 (*) 70 - 99 (mg/dL)    BUN 62 (*) 6 - 23 (mg/dL)    Creatinine, Ser 1.99 (*) 0.50 - 1.10 (mg/dL)    Calcium 8.8  8.4 - 10.5 (mg/dL)    Total Protein 5.8 (*) 6.0 - 8.3 (g/dL)    Albumin 2.3 (*) 3.5 - 5.2 (g/dL)    AST 18  0 - 37 (U/L)    ALT 14  0 - 35 (U/L)    Alkaline Phosphatase 161 (*) 39 - 117 (U/L)    Total Bilirubin 0.2 (*) 0.3 - 1.2 (mg/dL)    GFR calc non Af Amer 26 (*) >90 (mL/min)    GFR calc Af Amer 30 (*) >90 (mL/min)   APTT     Status: Normal   Collection Time   06/06/11  9:30 PM      Component Value Range Comment   aPTT 28  24 - 37 (seconds)   PROTIME-INR     Status:  Normal   Collection Time   06/06/11  9:30 PM      Component Value Range Comment   Prothrombin Time 14.3  11.6 - 15.2 (seconds)    INR 1.09  0.00 - 1.49    MAGNESIUM     Status: Abnormal   Collection Time   06/06/11  9:30 PM      Component Value Range Comment   Magnesium 1.4 (*) 1.5 - 2.5 (mg/dL)   URINALYSIS, ROUTINE W REFLEX MICROSCOPIC     Status: Abnormal   Collection Time  06/07/11  1:06 AM      Component Value Range Comment   Color, Urine YELLOW  YELLOW     Appearance TURBID (*) CLEAR     Specific Gravity, Urine 1.012  1.005 - 1.030     pH 6.0  5.0 - 8.0     Glucose, UA NEGATIVE  NEGATIVE (mg/dL)    Hgb urine dipstick SMALL (*) NEGATIVE     Bilirubin Urine NEGATIVE  NEGATIVE     Ketones, ur NEGATIVE  NEGATIVE (mg/dL)    Protein, ur 100 (*) NEGATIVE (mg/dL)    Urobilinogen, UA 0.2  0.0 - 1.0 (mg/dL)    Nitrite POSITIVE (*) NEGATIVE     Leukocytes, UA LARGE (*) NEGATIVE    SODIUM, URINE, RANDOM     Status: Normal   Collection Time   06/07/11  1:06 AM      Component Value Range Comment   Sodium, Ur 31     CREATININE, URINE, RANDOM     Status: Normal   Collection Time   06/07/11  1:06 AM      Component Value Range Comment   Creatinine, Urine 64.5     URINE MICROSCOPIC-ADD ON     Status: Abnormal   Collection Time   06/07/11  1:06 AM      Component Value Range Comment   Squamous Epithelial / LPF RARE  RARE     WBC, UA TOO NUMEROUS TO COUNT  <3 (WBC/hpf)    Bacteria, UA MANY (*) RARE     Urine-Other FIELD OBSCURED BY WBC'S     MRSA PCR SCREENING     Status: Normal   Collection Time   06/07/11  1:07 AM      Component Value Range Comment   MRSA by PCR NEGATIVE  NEGATIVE    OCCULT BLOOD X 1 CARD TO LAB, STOOL     Status: Normal   Collection Time   06/07/11  4:08 AM      Component Value Range Comment   Fecal Occult Bld NEGATIVE     BASIC METABOLIC PANEL     Status: Abnormal   Collection Time   06/07/11  5:10 AM      Component Value Range Comment   Sodium 140   135 - 145 (mEq/L)    Potassium 3.2 (*) 3.5 - 5.1 (mEq/L)    Chloride 107  96 - 112 (mEq/L)    CO2 25  19 - 32 (mEq/L)    Glucose, Bld 89  70 - 99 (mg/dL)    BUN 58 (*) 6 - 23 (mg/dL)    Creatinine, Ser 1.96 (*) 0.50 - 1.10 (mg/dL)    Calcium 8.6  8.4 - 10.5 (mg/dL)    GFR calc non Af Amer 26 (*) >90 (mL/min)    GFR calc Af Amer 30 (*) >90 (mL/min)   CBC     Status: Abnormal   Collection Time   06/07/11  5:10 AM      Component Value Range Comment   WBC 11.3 (*) 4.0 - 10.5 (K/uL)    RBC 3.11 (*) 3.87 - 5.11 (MIL/uL)    Hemoglobin 8.4 (*) 12.0 - 15.0 (g/dL)    HCT 25.8 (*) 36.0 - 46.0 (%)    MCV 83.0  78.0 - 100.0 (fL)    MCH 27.0  26.0 - 34.0 (pg)    MCHC 32.6  30.0 - 36.0 (g/dL)    RDW 19.3 (*) 11.5 - 15.5 (%)    Platelets 336  150 - 400 (K/uL)      ROS:  A comprehensive review of systems was negative. In fact, patient is wanting to go home.  She does have some edema to dependant areas but no SO  Physical Exam: Filed Vitals:   06/07/11 0917  BP: 110/68  Pulse: 87  Temp:   Resp:      General: alert, obese, NAD HEENT:PERRLA, EOMI Neck: no JVD Heart: RRR Lungs: slight decreased BS at bases Abdomen: soft, NT Extremities: dependant edema    Assessment/Plan: 65 yo BF with A on CRF in the setting of UTI and possible volume depletion 1.Renal- Agree with holding micardis and lasix initially.  Now, I think tank is full, will d/c IVF and agree with restarting lasix PO 80 BID.  I would continue to hold micardis likely at d/c and allow Dr. Jimmy Footman to resume as OP 2. Hypertension/volume  - Tank now full, not dry, will resume lasix at half dose 4. Anemia  - probably multifactorial due to surgery and CKD. No GI work up planned this is appropriate.  I will give one dose aranesp here but will not need to be continued as OP 5. From renal standpoint I think patient could be d/cd tomorrow.     Cephus Tupy A 06/07/2011, 11:36 AM

## 2011-06-07 NOTE — Consult Note (Signed)
[name] Consultation  Referring Provider: No ref. provider found Primary Care Physician:  Garnet Koyanagi, DO, DO Primary Gastroenterologist:  Dr.Mann  Reason for Consultation:  Heme positive anemia  HPI: [name] is a 65 y.o. female here with anemia Hgb 9.2, heme positive stool but no visible blood. She had a recent hip surgery. No Coumadin was given since she had a hx of hemorrhagic CVA in 2009. She was having regular BM's till hospitalization where she is having diarrhea as a result of Colace 100mg  po qd. Colonosocpy by Dr Collene Mares 5-7 years ago was normal, pt told to come back in 10 years.    Past Medical History  Diagnosis Date  . Asthma   . Hypertension   . Depression   . Atrial fibrillation   . Fractures, stress     in both feet  . Arrhythmia   . Kidney disease     pt states creatinine is high  . 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 Stroke   . Gout     Past Surgical History  Procedure Date  . Cholecystectomy   . Cardiac valve surgery   . Joint replacement     right hip and right femur x 2    Prior to Admission medications   Medication Sig Start Date End Date Taking? Authorizing Provider  acetaminophen (TYLENOL) 500 MG tablet Take 500 mg by mouth as needed.     Yes Historical Provider, MD  allopurinol (ZYLOPRIM) 100 MG tablet Take 300 mg by mouth daily. 3 tabs daily   Yes Historical Provider, MD  aspirin 325 MG tablet Take 325 mg by mouth 2 (two) times daily.     Yes Historical Provider, MD  colchicine 0.6 MG tablet Take 0.6 mg by mouth 2 (two) times daily.     Yes Historical Provider, MD  diltiazem (CARDIZEM CD) 300 MG 24 hr capsule Take 300 mg by mouth daily.     Yes Historical Provider, MD  diphenhydrAMINE (BENADRYL) 25 MG tablet Take 25 mg by mouth every 6 (six) hours as needed. Itching/rash    Yes Historical Provider, MD  docusate sodium (COLACE) 100 MG capsule Take 100 mg by mouth 2 (two) times daily.     Yes Historical Provider, MD    doxercalciferol (HECTOROL) 0.5 MCG capsule Take 1 mcg by mouth daily.     Yes Historical Provider, MD  ferrous sulfate 325 (65 FE) MG tablet Take 325 mg by mouth 3 (three) times daily with meals.     Yes Historical Provider, MD  furosemide (LASIX) 80 MG tablet Take 80 mg by mouth 2 (two) times daily. 2 tab in am and 1 tab    Yes Historical Provider, MD  gabapentin (NEURONTIN) 300 MG capsule Take 300 mg by mouth 3 (three) times daily.    Yes Historical Provider, MD  HYDROcodone-acetaminophen (NORCO) 7.5-325 MG per tablet Take 2 tablets by mouth every 4 (four) hours as needed. pain    Yes Historical Provider, MD  methocarbamol (ROBAXIN) 500 MG tablet Take 500 mg by mouth 4 (four) times daily.     Yes Historical Provider, MD  metoprolol (TOPROL XL) 100 MG 24 hr tablet Take 100 mg by mouth daily.     Yes Historical Provider, MD  Multiple Vitamins-Minerals (MULTIVITAMINS THER. W/MINERALS) TABS Take 1 tablet by mouth daily.     Yes Historical Provider, MD  potassium chloride SA (KLOR-CON M20) 20 MEQ tablet Take 20 mEq by mouth daily.  Yes Historical Provider, MD  protein supplement (PROSOURCE NO CARB) LIQD Take 30 mLs by mouth 2 (two) times daily.     Yes Historical Provider, MD  sertraline (ZOLOFT) 50 MG tablet Take 1 tablet (50 mg total) by mouth daily. 04/06/11  Yes Yvonne R Lowne, DO  sodium chloride 0.9 % injection Inject 5 mLs into the vein every evening.     Yes Historical Provider, MD  sotalol (BETAPACE) 80 MG tablet Take 80 mg by mouth 2 (two) times daily.     Yes Historical Provider, MD  telmisartan (MICARDIS) 80 MG tablet Take 80 mg by mouth daily.     Yes Historical Provider, MD    Current Facility-Administered Medications  Medication Dose Route Frequency Provider Last Rate Last Dose  . 0.9 %  sodium chloride infusion  1,000 mL Intravenous STAT Babette Relic, MD 125 mL/hr at 06/06/11 1206 1,000 mL at 06/06/11 1206  . 0.9 %  sodium chloride infusion   Intravenous Continuous Irine Seal 100 mL/hr at 06/07/11 0701    . allopurinol (ZYLOPRIM) tablet 300 mg  300 mg Oral Daily Daniel Thompson   300 mg at 06/07/11 0019  . alum & mag hydroxide-simeth (MAALOX/MYLANTA) 200-200-20 MG/5ML suspension 30 mL  30 mL Oral Q6H PRN Irine Seal      . aspirin tablet 325 mg  325 mg Oral BID Irine Seal   325 mg at 06/07/11 0020  . colchicine tablet 0.6 mg  0.6 mg Oral BID Irine Seal   0.6 mg at 06/07/11 0020  . diltiazem (CARDIZEM CD) 24 hr capsule 300 mg  300 mg Oral Daily Daniel Thompson   300 mg at 06/07/11 0019  . diphenhydrAMINE (BENADRYL) tablet 25 mg  25 mg Oral Q6H PRN Irine Seal      . doxercalciferol (HECTOROL) capsule 1 mcg  1 mcg Oral Daily Irine Seal   1 mcg at 06/07/11 0019  . ferrous sulfate tablet 325 mg  325 mg Oral TID WC Irine Seal      . fluconazole (DIFLUCAN) tablet 200 mg  200 mg Oral Daily Daniel Thompson   200 mg at 06/07/11 0019  . gabapentin (NEURONTIN) capsule 300 mg  300 mg Oral TID Irine Seal   300 mg at 06/07/11 0020  . HYDROcodone-acetaminophen (NORCO) 7.5-325 MG per tablet 2 tablet  2 tablet Oral Q4H PRN Irine Seal   2 tablet at 06/07/11 0029  . HYDROmorphone (DILAUDID) 2 MG/ML injection        1 mg at 06/06/11 1636  . HYDROmorphone (DILAUDID) injection 1 mg  1 mg Intravenous Once Babette Relic, MD      . methocarbamol (ROBAXIN) tablet 500 mg  500 mg Oral QID Irine Seal   500 mg at 06/07/11 0022  . metoprolol (TOPROL-XL) 24 hr tablet 100 mg  100 mg Oral Daily Daniel Thompson   100 mg at 06/07/11 0018  . morphine 2 MG/ML injection 1 mg  1 mg Intravenous Q4H PRN Irine Seal      . multivitamins ther. w/minerals tablet 1 tablet  1 tablet Oral Daily Irine Seal      . nystatin (NYSTOP) topical powder   Topical TID Irine Seal      . ondansetron (ZOFRAN) tablet 4 mg  4 mg Oral Q6H PRN Irine Seal       Or  . ondansetron [facility]) injection 4 mg  4 mg Intravenous Q6H PRN Irine Seal      .  pantoprazole (PROTONIX) 80  mg in sodium chloride 0.9 % 100 mL IVPB  80 mg Intravenous Once Babette Relic, MD   80 mg at 06/06/11 1917  . pantoprazole (PROTONIX) injection 40 mg  40 mg Intravenous Q12H Daniel Thompson   40 mg at 06/07/11 0016  . potassium chloride SA (K-DUR,KLOR-CON) CR tablet 20 mEq  20 mEq Oral Daily Irine Seal      . potassium chloride SA (K-DUR,KLOR-CON) CR tablet 40 mEq  40 mEq Oral Once Babette Relic, MD   40 mEq at 06/06/11 1626  . potassium chloride SA (K-DUR,KLOR-CON) CR tablet 40 mEq  40 mEq Oral Q4H Daniel Thompson   40 mEq at 06/07/11 0359  . potassium chloride SA (K-DUR,KLOR-CON) CR tablet 40 mEq  40 mEq Oral Once Dorrene German, PHARMD      . potassium chloride SA (K-DUR,KLOR-CON) CR tablet 40 mEq  40 mEq Oral Q4H Irine Seal      . protein supplement (PROSOURCE NO CARB) liquid 30 mL  30 mL Oral BID Irine Seal      . senna-docusate (Senokot-S) tablet 1 tablet  1 tablet Oral Daily PRN Irine Seal      . sertraline (ZOLOFT) tablet 50 mg  50 mg Oral Daily Irine Seal   50 mg at 06/07/11 0018  . sodium chloride 0.9 % injection 5 mL  5 mL Intravenous QPM Irine Seal   5 mL at 06/06/11 2115  . sotalol (BETAPACE) tablet 80 mg  80 mg Oral BID Irine Seal   80 mg at 06/07/11 0019  . DISCONTD: potassium chloride SA (K-DUR,KLOR-CON) CR tablet 40 mEq  40 mEq Oral Q4H Irine Seal      . DISCONTD: sodium chloride 0.9 % bolus 1,000 mL  1,000 mL Intravenous Once Babette Relic, MD        Allergies as of 06/06/2011 - Review Complete 06/06/2011  Allergen Reaction Noted  . Coumadin Other (See Comments) 06/06/2011    Family History  Problem Relation Age of Onset  . Diabetes Mother   . Hypertension Mother   . Hodgkin's lymphoma  32    dscd---HODGKINS DISEASE  . Cancer Father     History   Social History  . Marital Status: Married    Spouse Name: N/A    Number of Children: N/A  . Years of Education: N/A   Occupational History  . Not  on file.   Social History Main Topics  . Smoking status: Never Smoker   . Smokeless tobacco: Never Used  . Alcohol Use: No  . Drug Use: No  . Sexually Active: Yes    Birth Control/ Protection: Post-menopausal   Other Topics Concern  . Not on file   Social History Narrative  . No narrative on file    Review of Systems: Gen: Denies any fever, chills, sweats, anorexia, fatigue, weakness, malaise, weight loss, and sleep disorder CV: Denies chest pain, angina, palpitations, syncope, orthopnea, PND, peripheral edema, and claudication. Resp: Denies dyspnea at rest, dyspnea with exercise, cough, sputum, wheezing, coughing up blood, and pleurisy. GI: Denies vomiting blood, jaundice, and fecal incontinence.   Denies dysphagia or odynophagia. GU : Denies urinary burning, blood in urine, urinary frequency, urinary hesitancy, nocturnal urination, and urinary incontinence. MS left hip pain post surgery back pain, extremity pain. Denies muscle weakness, cramps, atrophy. .  Psych: Denies depression, anxiety, memory loss, suicidal ideation, hallucinations, paranoia, and confusion. Heme: Denies bruising, bleeding, and enlarged lymph nodes. Neuro:  Denies any headaches, dizziness, paresthesias. Endo:  Denies any problems with DM, thyroid, adrenal function.  Physical Exam: Vital signs in last 24 hours: Temp:  [98.2 F (36.8 C)-99.2 F (37.3 C)] 98.4 F (36.9 C) (11/22 0505) Pulse Rate:  [66-96] 83  (11/22 0505) Resp:  [16-20] 18  (11/22 0505) BP: (102-147)/(8-74) 115/68 mmHg (11/22 0505) SpO2:  [93 %-100 %] 93 % (11/22 0505) Weight:  [118.3 kg (260 lb 12.9 oz)] 260 lb 12.9 oz (118.3 kg) (11/21 2050) Last BM Date: 06/06/11 General:   Alert,  Well-developed, well-nourished, pleasant and cooperative in NAD Head:  Normocephalic and atraumatic. Eyes:  Sclera clear, no icterus.   Conjunctiva pink. Ears:  Normal auditory acuity. Nose:  No deformity, discharge,  or lesions. Mouth:  No deformity or  lesions.  Oropharynx pink & moist. Neck:  Supple; no masses or thyromegaly. Lungs:  Clear throughout to auscultation.   No wheezes, crackles, or rhonchi. No acute distress. Heart:  Regular rate and rhythm; no murmurs, clicks, rubs,  or gallops. Abdomen:  Soft, nontender and nondistended. No masses, hepatosplenomegaly or hernias noted. Normal bowel sounds, without guarding, and without rebound.   Rectal:soft black ( iron) heme negative stool  Msk:  Symmetrical without gross deformities. Normal posture. Pulses:  Normal pulses noted. Extremities:  Without clubbing or edema. Neurologic:  Alert and  oriented x4;  grossly normal neurologically. Skin:  Intact without significant lesions or rashes. Cervical Nodes:  No significant cervical adenopathy. Psych:  Alert and cooperative. Normal mood and affect.  Intake/Output from previous day: 11/21 0701 - 11/22 0700 In: 1000 [I.V.:1000] Out: 501 [Urine:500; Stool:1] Intake/Output this shift:    Lab Results:  Basename 06/07/11 0510 06/06/11 1900 06/06/11 1100  WBC 11.3* 10.3 10.0  HGB 8.4* 8.7* 9.2*  HCT 25.8* 27.0* 28.7*  PLT 336 287 284   BMET  Basename 06/07/11 0510 06/06/11 2130 06/06/11 1900  NA 140 136 137  K 3.2* 2.8* 2.8*  CL 107 103 102  CO2 25 24 26   GLUCOSE 89 123* 87  BUN 58* 62* 64*  CREATININE 1.96* 1.99* 2.00*  CALCIUM 8.6 8.8 8.8   LFT  Basename 06/06/11 2130  PROT 5.8*  ALBUMIN 2.3*  AST 18  ALT 14  ALKPHOS 161*  BILITOT 0.2*  BILIDIR --  IBILI --   PT/INR  Basename 06/06/11 2130  LABPROT 14.3  INR 1.09   Hepatitis Panel No results found for this basename: HEPBSAG,HCVAB,HEPAIGM,HEPBIGM in the last 72 hours    Studies/Results: Dg Chest Port 1 View  06/06/2011  *RADIOLOGY REPORT*  Clinical Data: Bedside PICC placement.  PORTABLE CHEST - 1 VIEW 06/06/2011 2218 hours:  Comparison: [facility] and [facility]/07/2009 [facility].  Findings: Right arm PICC tip in the  mid SVC.  Patient rotated to the left.  Cardiac silhouette enlarged but stable.  Pulmonary venous hypertension without overt edema.  Focal airspace opacity in the medial left lung base.  Lungs otherwise clear.  No visible pleural effusions.  IMPRESSION:  1.  Right arm PICC tip in the mid SVC. 2.  Cardiomegaly.  Pulmonary venous hypertension without overt edema.  Atelectasis versus pneumonia at the left lung base.  Original Report Authenticated By: Deniece Portela, M.D.     Previous Endoscopies: See HPI  Impression / Plan: Heme negative stool today, anemia likely multifactorial ( renal insufficiency, hip surgery,), she is up to date on her colonoscopy. No plans for GI work up at this admission, may follow up with Dr Collene Mares as an outpatient  LOS: 1 day   Delfin Edis  06/07/2011, 9:18 AM

## 2011-06-07 NOTE — Progress Notes (Signed)
Subjective: No complaints. No overt bleeding.  Objective: Vital signs in last 24 hours: Filed Vitals:   06/06/11 2011 06/06/11 2050 06/07/11 0505 06/07/11 0917  BP: 135/64 128/74 115/68 110/68  Pulse: 96 96 83 87  Temp: 99 F (37.2 C) 98.4 F (36.9 C) 98.4 F (36.9 C)   TempSrc: Oral Oral Oral   Resp: 20 18 18    Height:  5\' 6"  (1.676 m)    Weight:  118.3 kg (260 lb 12.9 oz)    SpO2: 99% 100% 93%     Intake/Output Summary (Last 24 hours) at 06/07/11 1100 Last data filed at 06/07/11 0935  Gross per 24 hour  Intake   1240 ml  Output    502 ml  Net    738 ml    Weight change:   General: Alert, awake, oriented x3, in no acute distress. HEENT: No bruits, no goiter. Heart: Regular rate and rhythm, without murmurs, rubs, gallops. Lungs: Clear to auscultation bilaterally. Abdomen: Soft, nontender, nondistended, positive bowel sounds. Extremities: No clubbing cyanosis. RLE with 2-3+ edema and hip wound. Lle with trace to 1+ edema. Neuro: Grossly intact, nonfocal.    Lab Results:  [facility] 06/07/11 0510 06/06/11 2130  NA 140 136  K 3.2* 2.8*  CL 107 103  CO2 25 24  GLUCOSE 89 123*  BUN 58* 62*  CREATININE 1.96* 1.99*  CALCIUM 8.6 8.8  MG -- 1.4*  PHOS -- --    Basename 06/06/11 2130 06/06/11 1100  AST 18 18  ALT 14 16  ALKPHOS 161* 167*  BILITOT 0.2* 0.3  PROT 5.8* 6.3  ALBUMIN 2.3* 2.5*   No results found for this basename: LIPASE:2,AMYLASE:2 in the last 72 hours  Basename 06/07/11 0510 06/06/11 1900 06/06/11 1100  WBC 11.3* 10.3 --  NEUTROABS -- -- 6.0  HGB 8.4* 8.7* --  HCT 25.8* 27.0* --  MCV 83.0 83.3 --  PLT 336 287 --   No results found for this basename: CKTOTAL:3,CKMB:3,CKMBINDEX:3,TROPONINI:3 in the last 72 hours No results found for this basename: POCBNP:3 in the last 72 hours No results found for this basename: DDIMER:2 in the last 72 hours No results found for this basename: HGBA1C:2 in the last 72 hours No results found for this  basename: CHOL:2,HDL:2,LDLCALC:2,TRIG:2,CHOLHDL:2,LDLDIRECT:2 in the last 72 hours No results found for this basename: TSH,T4TOTAL,FREET3,T3FREE,THYROIDAB in the last 72 hours  Basename 06/06/11 1900  VITAMINB12 1326*  FOLATE 5.6  FERRITIN 234  TIBC 155*  IRON 12*  RETICCTPCT 3.6*    Micro Results: Recent Results (from the past 240 hour(s))  MRSA PCR SCREENING     Status: Normal   Collection Time   06/07/11  1:07 AM      Component Value Range Status Comment   MRSA by PCR NEGATIVE  NEGATIVE  Final     Studies/Results: Dg Chest Port 1 View  06/06/2011  *RADIOLOGY REPORT*  Clinical Data: Bedside PICC placement.  PORTABLE CHEST - 1 VIEW 06/06/2011 2218 hours:  Comparison: Two-view chest x-ray 02/19/2011 [facility] and 01/07/2009 [facility].  Findings: Right arm PICC tip in the mid SVC.  Patient rotated to the left.  Cardiac silhouette enlarged but stable.  Pulmonary venous hypertension without overt edema.  Focal airspace opacity in the medial left lung base.  Lungs otherwise clear.  No visible pleural effusions.  IMPRESSION:  1.  Right arm PICC tip in the mid SVC. 2.  Cardiomegaly.  Pulmonary venous hypertension without overt edema.  Atelectasis versus pneumonia at the  left lung base.  Original Report Authenticated By: Deniece Portela, M.D.    Medications:     . sodium chloride  1,000 mL Intravenous STAT  . allopurinol  300 mg Oral Daily  . aspirin  325 mg Oral BID  . cefTRIAXone (ROCEPHIN) IV  1 g Intravenous Q24H  . colchicine  0.6 mg Oral Daily  . diltiazem  300 mg Oral Daily  . doxercalciferol  1 mcg Oral Daily  . ferrous sulfate  325 mg Oral TID WC  . fluconazole  100 mg Oral Daily  . gabapentin  300 mg Oral TID  . HYDROmorphone      .  HYDROmorphone (DILAUDID) injection  1 mg Intravenous Once  . methocarbamol  500 mg Oral QID  . metoprolol  100 mg Oral Daily  . multivitamins ther. w/minerals  1 tablet Oral Daily  . nystatin   Topical TID  .  pantoprazole (PROTONIX) IV  80 mg Intravenous Once  . pantoprazole (PROTONIX) IV  40 mg Intravenous Q12H  . potassium chloride SA  20 mEq Oral Daily  . potassium chloride SA  40 mEq Oral Once  . potassium chloride  40 mEq Oral Q4H  . potassium chloride  40 mEq Oral Once  . potassium chloride  40 mEq Oral Q4H  . protein supplement  30 mL Oral BID  . sertraline  50 mg Oral Daily  . sodium chloride  5 mL Intravenous QPM  . sotalol  80 mg Oral BID  . DISCONTD: colchicine  0.6 mg Oral BID  . DISCONTD: fluconazole  200 mg Oral Daily  . DISCONTD: potassium chloride  40 mEq Oral Q4H  . DISCONTD: sodium chloride  1,000 mL Intravenous Once    Assessment/Plan Principal Problem:  *Anemia Active Problems:  Hypokalemia  ARF (acute renal failure)  UTI  1. Iron deficiency Anemia/AOCD - Likely multifactorial sec to CKD and recent hip surgery. Repeat guaic negative. H/H stable. Patient seen by GI and recommend outpatient followup. Continue iron supplements. Change PPI  Prophylactic dose. Follow. 2. Hypokalemia - likely secondary to diuretics. Replete 3. UTI- urine cultures pending. IV Rocephin D1 4. CKD - Looking at records seems patient's baseline is aprox 2-2.7. Currently at baseline. Will NSL IVF. Continue to hold ARB. Resume lasix at 1/2 home dose of 80mg  BID lasix. Renal consult per family request pending, renal to please advice on diuretics and ARB. 5. R hip seroma s/p I and D - Wound vac pending. Xray pending. Patient seen by ortho and ff. 6. HTN- Stable . Continue current regimen. 7. Gout - continue allopurinol and decrease colchicine to daily. 8. ?? Candida - improved per patient. Nystatin powder. D/C diflucan after today's dose. 9.Afib - sotalol and metoprolol for rate control. ASA . 9. Prophylaxis - PPI for GI, SCD for DVT.    LOS: 1 day   Zuha Dejonge 06/07/2011, 11:00 AM

## 2011-06-08 DIAGNOSIS — D72829 Elevated white blood cell count, unspecified: Secondary | ICD-10-CM | POA: Diagnosis not present

## 2011-06-08 LAB — CBC
HCT: 26.6 % — ABNORMAL LOW (ref 36.0–46.0)
Hemoglobin: 8.3 g/dL — ABNORMAL LOW (ref 12.0–15.0)
MCH: 26.4 pg (ref 26.0–34.0)
MCHC: 31.2 g/dL (ref 30.0–36.0)
MCV: 84.7 fL (ref 78.0–100.0)
Platelets: 333 10*3/uL (ref 150–400)
RBC: 3.14 MIL/uL — ABNORMAL LOW (ref 3.87–5.11)
RDW: 19.3 % — ABNORMAL HIGH (ref 11.5–15.5)
WBC: 19.6 10*3/uL — ABNORMAL HIGH (ref 4.0–10.5)

## 2011-06-08 LAB — BASIC METABOLIC PANEL
BUN: 47 mg/dL — ABNORMAL HIGH (ref 6–23)
CO2: 24 mEq/L (ref 19–32)
Calcium: 8.8 mg/dL (ref 8.4–10.5)
Chloride: 110 mEq/L (ref 96–112)
Creatinine, Ser: 1.89 mg/dL — ABNORMAL HIGH (ref 0.50–1.10)
GFR calc Af Amer: 32 mL/min — ABNORMAL LOW (ref >90)
GFR calc non Af Amer: 27 mL/min — ABNORMAL LOW (ref >90)
Glucose, Bld: 91 mg/dL (ref 70–99)
Potassium: 3.5 mEq/L (ref 3.5–5.1)
Sodium: 141 mEq/L (ref 135–145)

## 2011-06-08 MED ORDER — POTASSIUM CHLORIDE CRYS ER 20 MEQ PO TBCR
40.0000 meq | EXTENDED_RELEASE_TABLET | Freq: Every day | ORAL | Status: DC
  Administered 2011-06-08 – 2011-06-11 (×4): 40 meq via ORAL
  Filled 2011-06-08 (×5): qty 2

## 2011-06-08 MED ORDER — SODIUM CHLORIDE 0.9 % IJ SOLN
10.0000 mL | INTRAMUSCULAR | Status: DC | PRN
  Administered 2011-06-08 – 2011-06-11 (×2): 10 mL

## 2011-06-08 NOTE — Progress Notes (Signed)
Patient ID: [name], female   DOB: 02-09-1946, 65 y.o.   MRN: [id number] S:feels better O:BP 112/74  Pulse 88  Temp(Src) 99.2 F (37.3 C) (Oral)  Resp 18  Ht 5\' 6"  (1.676 m)  Wt 118.3 kg (260 lb 12.9 oz)  BMI 42.09 kg/m2  SpO2 97%  Intake/Output Summary (Last 24 hours) at 06/08/11 1132 Last data filed at 06/08/11 0900  Gross per 24 hour  Intake   1940 ml  Output   2052 ml  Net   -112 ml   Weight change:  Gen:WD/WN AAF in NAD CVS:rrr Resp:CTA b/l Abd:+BS, soft, NT/ND Ext:3+ edema on right leg   Lab 06/08/11 0450 06/07/11 0510 06/06/11 2130 06/06/11 1900 06/06/11 1100  NA 141 140 136 137 138  K 3.5 3.2* 2.8* 2.8* 2.9*  CL 110 107 103 102 102  CO2 24 25 24 26 25   GLUCOSE 91 89 123* 87 80  BUN 47* 58* 62* 64* 71*  CREATININE 1.89* 1.96* 1.99* 2.00* 2.20*  ALB -- -- -- -- --  CALCIUM 8.8 8.6 8.8 8.8 9.3  PHOS -- -- -- -- --  AST -- -- 18 -- 18  ALT -- -- 14 -- 16   Liver Function Tests:  Lab 06/06/11 2130 06/06/11 1100  AST 18 18  ALT 14 16  ALKPHOS 161* 167*  BILITOT 0.2* 0.3  PROT 5.8* 6.3  ALBUMIN 2.3* 2.5*   No results found for this basename: LIPASE:3,AMYLASE:3 in the last 168 hours No results found for this basename: AMMONIA:3 in the last 168 hours CBC:  Lab 06/08/11 0450 06/07/11 0510 06/06/11 1900 06/06/11 1100  WBC 19.6* 11.3* 10.3 --  NEUTROABS -- -- -- 6.0  HGB 8.3* 8.4* 8.7* --  HCT 26.6* 25.8* 27.0* --  MCV 84.7 83.0 83.3 83.9  PLT 333 336 287 --   Cardiac Enzymes: No results found for this basename: CKTOTAL:5,CKMB:5,CKMBINDEX:5,TROPONINI:5 in the last 168 hours CBG: No results found for this basename: GLUCAP:5 in the last 168 hours  Iron Studies:  Basename 06/06/11 1900  IRON 12*  TIBC 155*  TRANSFERRIN --  FERRITIN 234   Studies/Results: Dg Chest Port 1 View  06/06/2011  *RADIOLOGY REPORT*  Clinical Data: Bedside PICC placement.  PORTABLE CHEST - 1 VIEW 06/06/2011 2218 hours:  Comparison: Two-view chest x-ray 02/19/2011  [facility] and 01/07/2009 [facility].  Findings: Right arm PICC tip in the mid SVC.  Patient rotated to the left.  Cardiac silhouette enlarged but stable.  Pulmonary venous hypertension without overt edema.  Focal airspace opacity in the medial left lung base.  Lungs otherwise clear.  No visible pleural effusions.  IMPRESSION:  1.  Right arm PICC tip in the mid SVC. 2.  Cardiomegaly.  Pulmonary venous hypertension without overt edema.  Atelectasis versus pneumonia at the left lung base.  Original Report Authenticated By: Deniece Portela, M.D.   Dg Hip Portable 1 View Right  06/07/2011  *RADIOLOGY REPORT*  Clinical Data: Postop femur fracture  PORTABLE RIGHT HIP - 1 VIEW  Comparison: None.  Findings: Stable right total hip arthroplasty with anatomic alignment of the colon.  The joint prosthetic components.  Plate and screws with cerclage wires remain in place in the femur transfixing a midshaft femur fracture.  The fracture is obscured.  IMPRESSION: ORIF femur fracture and total hip arthroplasty, with stable hardware.  Original Report Authenticated By: Jamas Lav, M.D.   Dg Femur Right Port  06/07/2011  *RADIOLOGY REPORT*  Clinical  Data: Postop  PORTABLE RIGHT FEMUR - 2 VIEW  Comparison: 04/16/2011  Findings: Right total hip arthroplasty remains anatomic.  Plate and screws with cerclage wires transfixing a proximal diaphyseal femur fracture remain in place.  No breakage or loosening of the hardware.  Anatomic alignment of the femur.  There is been some interval healing at the fracture site. Osteopenia.  Severe osteoarthritic change of the knee.  IMPRESSION: Interval healing of the proximal femur fracture.  Hardware is stable.  Original Report Authenticated By: Jamas Lav, M.D.      . allopurinol  300 mg Oral Daily  . aspirin  325 mg Oral BID  . cefTRIAXone (ROCEPHIN) IV  1 g Intravenous Q24H  . colchicine  0.6 mg Oral Daily  . darbepoetin (ARANESP) injection - NON-DIALYSIS   100 mcg Subcutaneous Once  . diltiazem  300 mg Oral Daily  . doxercalciferol  1 mcg Oral Daily  . ferrous sulfate  325 mg Oral TID WC  . fluconazole  100 mg Oral Daily  . furosemide  80 mg Oral BID  . gabapentin  300 mg Oral TID  . methocarbamol  500 mg Oral QID  . metoprolol  100 mg Oral Daily  . multivitamins ther. w/minerals  1 tablet Oral Daily  . nystatin   Topical TID  . pantoprazole  40 mg Oral Q0600  . potassium chloride SA  20 mEq Oral Daily  . potassium chloride  40 mEq Oral Q4H  . potassium chloride  40 mEq Oral Daily  . protein supplement  30 mL Oral BID  . sertraline  50 mg Oral Daily  . sodium chloride  5 mL Intravenous QPM  . sotalol  80 mg Oral BID    Assessment/Plan:  1. AKI/CKD- improving.  Off ARB and on lower dose of lasix.  Will f/u with Dr. Jimmy Footman (pt to call and reschedule.  Do not restart micardis until directed by Dr. Jimmy Footman 2. HTN- stable 3. ABLA/ACDz- on epo 4. Weakness- continue with rehab at SNF 5. Hip fx- a/p ORIF 6. dispo- stable for d/c to SNF and f/u as above.  Will sign off  Dahlila Pfahler A

## 2011-06-08 NOTE — Progress Notes (Signed)
UR CHART REVIEWED 

## 2011-06-08 NOTE — Progress Notes (Signed)
Pt admitted from [facility] and is able to return upon d/c. Per Sheree in admission, pt able to return over weekend if needed. Full CSW eval and FL2 on chart.  Wandra Feinstein, MSW, [place] (cover)

## 2011-06-08 NOTE — Consult Note (Signed)
WOC consult Note Reason for Consult: NPWT to right hip wound Wound type: healing surgical wound resulting from seroma Measurement: wound dressing not changed today Wound bed: not visualized today Drainage (amount, consistency, odor) light yellow in cannister Dressing procedure/placement/frequency: Dr. Alvan Dame saw yesterday and ordered wound VAC to be placed (done yesterday afternoon).  Ordered NPWT dressings to be changed every M-W-F     139mmHg Continuous with black foam dressing.  Staff RN to phone Dr. Alvan Dame today to determine if dressing should be changed today (Friday) or tomorrow (Saturday) before scheduled (routine) change on Monday. I will not follow.  Please re-consult if needed. Thanks, Maudie Flakes, MSN, RN, [facility], [place] [phone])

## 2011-06-08 NOTE — Plan of Care (Signed)
Problem: Consults Goal: Skin Care Protocol Initiated - if indicated If consults are not indicated, leave blank or document N/A  Outcome: Completed/Met Date Met:  06/08/11 Wound vac was applied to right hip wound.

## 2011-06-08 NOTE — Progress Notes (Signed)
OT Note:  Pt screened for OT.  She is aware of THPs and has been receiving rehab at a [facility].  She will wait for OT until she returns to facility. [place], [place] [phone] 06/08/2011

## 2011-06-08 NOTE — Progress Notes (Signed)
Subjective: Patient has no complaints. Patient states had some loose stools this morning.  Objective: Vital signs in last 24 hours: Filed Vitals:   06/07/11 1412 06/07/11 2216 06/08/11 0542 06/08/11 1015  BP: 109/65 122/68 126/73 112/74  Pulse: 81 97 91 88  Temp: 98.3 F (36.8 C) 98.8 F (37.1 C) 99.2 F (37.3 C)   TempSrc: Oral Oral Oral   Resp: 18 18 18    Height:      Weight:      SpO2: 98% 97% 97%     Intake/Output Summary (Last 24 hours) at 06/08/11 1155 Last data filed at 06/08/11 0900  Gross per 24 hour  Intake   1940 ml  Output   2052 ml  Net   -112 ml    Weight change:   General: Alert, awake, oriented x3, in no acute distress. HEENT: No bruits, no goiter. Heart: Regular rate and rhythm, without murmurs, rubs, gallops. Lungs: Clear to auscultation bilaterally. Abdomen: Soft, nontender, nondistended, positive bowel sounds. Extremities: No clubbing cyanosis. RLE with 2-3+ edema and hip wound. Lle with trace to 1+ edema. Neuro: Grossly intact, nonfocal.    Lab Results:  Basename 06/08/11 0450 06/07/11 0510 06/06/11 2130  NA 141 140 --  K 3.5 3.2* --  CL 110 107 --  CO2 24 25 --  GLUCOSE 91 89 --  BUN 47* 58* --  CREATININE 1.89* 1.96* --  CALCIUM 8.8 8.6 --  MG -- -- 1.4*  PHOS -- -- --    [place] 06/06/11 2130 06/06/11 1100  AST 18 18  ALT 14 16  ALKPHOS 161* 167*  BILITOT 0.2* 0.3  PROT 5.8* 6.3  ALBUMIN 2.3* 2.5*   No results found for this basename: LIPASE:2,AMYLASE:2 in the last 72 hours  Basename 06/08/11 0450 06/07/11 0510 06/06/11 1100  WBC 19.6* 11.3* --  NEUTROABS -- -- 6.0  HGB 8.3* 8.4* --  HCT 26.6* 25.8* --  MCV 84.7 83.0 --  PLT 333 336 --   No results found for this basename: CKTOTAL:3,CKMB:3,CKMBINDEX:3,TROPONINI:3 in the last 72 hours No results found for this basename: POCBNP:3 in the last 72 hours No results found for this basename: DDIMER:2 in the last 72 hours No results found for this basename: HGBA1C:2 in the  last 72 hours No results found for this basename: CHOL:2,HDL:2,LDLCALC:2,TRIG:2,CHOLHDL:2,LDLDIRECT:2 in the last 72 hours No results found for this basename: TSH,T4TOTAL,FREET3,T3FREE,THYROIDAB in the last 72 hours  Basename 06/06/11 1900  VITAMINB12 1326*  FOLATE 5.6  FERRITIN 234  TIBC 155*  IRON 12*  RETICCTPCT 3.6*    Micro Results: Recent Results (from the past 240 hour(s))  MRSA PCR SCREENING     Status: Normal   Collection Time   06/07/11  1:07 AM      Component Value Range Status Comment   MRSA by PCR NEGATIVE  NEGATIVE  Final     Studies/Results: Dg Chest Port 1 View  06/06/2011  *RADIOLOGY REPORT*  Clinical Data: Bedside PICC placement.  PORTABLE CHEST - 1 VIEW 06/06/2011 2218 hours:  Comparison: Two-view chest x-ray 02/19/2011 [facility] and 01/07/2009 [facility].  Findings: Right arm PICC tip in the mid SVC.  Patient rotated to the left.  Cardiac silhouette enlarged but stable.  Pulmonary venous hypertension without overt edema.  Focal airspace opacity in the medial left lung base.  Lungs otherwise clear.  No visible pleural effusions.  IMPRESSION:  1.  Right arm PICC tip in the mid SVC. 2.  Cardiomegaly.  Pulmonary venous hypertension  without overt edema.  Atelectasis versus pneumonia at the left lung base.  Original Report Authenticated By: Deniece Portela, M.D.   Dg Hip Portable 1 View Right  06/07/2011  *RADIOLOGY REPORT*  Clinical Data: Postop femur fracture  PORTABLE RIGHT HIP - 1 VIEW  Comparison: None.  Findings: Stable right total hip arthroplasty with anatomic alignment of the colon.  The joint prosthetic components.  Plate and screws with cerclage wires remain in place in the femur transfixing a midshaft femur fracture.  The fracture is obscured.  IMPRESSION: ORIF femur fracture and total hip arthroplasty, with stable hardware.  Original Report Authenticated By: Jamas Lav, M.D.   Dg Femur Right Port  06/07/2011  *RADIOLOGY REPORT*   Clinical Data: Postop  PORTABLE RIGHT FEMUR - 2 VIEW  Comparison: 04/16/2011  Findings: Right total hip arthroplasty remains anatomic.  Plate and screws with cerclage wires transfixing a proximal diaphyseal femur fracture remain in place.  No breakage or loosening of the hardware.  Anatomic alignment of the femur.  There is been some interval healing at the fracture site. Osteopenia.  Severe osteoarthritic change of the knee.  IMPRESSION: Interval healing of the proximal femur fracture.  Hardware is stable.  Original Report Authenticated By: Jamas Lav, M.D.    Medications:     . allopurinol  300 mg Oral Daily  . aspirin  325 mg Oral BID  . cefTRIAXone (ROCEPHIN) IV  1 g Intravenous Q24H  . colchicine  0.6 mg Oral Daily  . darbepoetin (ARANESP) injection - NON-DIALYSIS  100 mcg Subcutaneous Once  . diltiazem  300 mg Oral Daily  . doxercalciferol  1 mcg Oral Daily  . ferrous sulfate  325 mg Oral TID WC  . fluconazole  100 mg Oral Daily  . furosemide  80 mg Oral BID  . gabapentin  300 mg Oral TID  . methocarbamol  500 mg Oral QID  . metoprolol  100 mg Oral Daily  . multivitamins ther. w/minerals  1 tablet Oral Daily  . nystatin   Topical TID  . pantoprazole  40 mg Oral Q0600  . potassium chloride SA  20 mEq Oral Daily  . potassium chloride  40 mEq Oral Q4H  . potassium chloride  40 mEq Oral Daily  . protein supplement  30 mL Oral BID  . sertraline  50 mg Oral Daily  . sodium chloride  5 mL Intravenous QPM  . sotalol  80 mg Oral BID    Assessment/Plan Principal Problem:  *Anemia Active Problems: Leukocytosis  Hypokalemia  ARF (acute renal failure)  UTI  1. Iron deficiency Anemia/AOCD - Likely multifactorial sec to CKD and recent hip surgery. Repeat guaic negative. H/H stable. Patient seen by GI and recommend outpatient followup. Continue iron supplements. Change PPI  Prophylactic dose. Follow. 2. leukocytosis- unknown etiology. Patient is afebrile. Significant jump in the  patient's white count from 11.3-19.6. Patient currently being treated for urinary tract infection. Urine cultures are pending. Will check stool for C. difficile PCR as patient is currently having some loose stools and has been on antibiotics. Will also check blood cultures x2. Continue Rocephin for now day #2. If no source is found and WBC continues to rise may need to reevaluate hip again. Follow. 3. Hypokalemia - likely secondary to diuretics. Repleted 4. UTI- urine cultures pending. IV Rocephin D2 5. CKD - Looking at records seems patient's baseline is aprox 2-2.7. Currently at baseline. Will NSL IVF. Continue to hold ARB. Resume lasix at 1/2  home dose of 80mg  BID lasix. Renal consult per family request pending, renal to please advice on diuretics and ARB. 6. R hip seroma s/p I and D - Wound vac pending. Patient seen by ortho and ff. 7. HTN- Stable . Continue current regimen. 8. Gout - continue allopurinol and decrease colchicine to daily. 9.. ?? Candida - improved per patient. Nystatin powder. D/C diflucan after today's dose. 10..Afib - sotalol and metoprolol for rate control. ASA . 9. Prophylaxis - PPI for GI, SCD for DVT.    LOS: 2 days   [facility] 06/08/2011, 11:55 AM

## 2011-06-08 NOTE — Progress Notes (Signed)
Physical Therapy Evaluation Patient Details Name: [name] MRN: [id number] DOB: 12-22-1945 Today's Date: 06/08/2011 Time: [id number]  Eval II Problem List:  Patient Active Problem List  Diagnoses  . TINEA CORPORIS  . MORBID OBESITY  . DEPRESSION  . WEAKNESS, LEFT SIDE OF BODY  . HYPERTENSION  . ATRIAL FIBRILLATION  . CVA  . ASTHMA  . RENAL FAILURE, ACUTE  . SEBACEOUS CYST, BREAST  . FOLLICULITIS  . NAUSEA WITH VOMITING  . ABDOMINAL PAIN, UNSPECIFIED SITE  . IATROGENIC CEREBROVASCULAR INFARCT/HEMORRHAGE NE  . ARRHYTHMIA, HX OF  . SINUSITIS- ACUTE-NOS  . Low back pain radiating to right leg  . Anemia  . Hypokalemia  . ARF (acute renal failure)    Past Medical History:  Past Medical History  Diagnosis Date  . Asthma   . Hypertension   . Depression   . Atrial fibrillation   . Fractures, stress     in both feet  . Arrhythmia   . Kidney disease     pt states creatinine is high  . 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 Stroke   . Gout    Past Surgical History:  Past Surgical History  Procedure Date  . Cholecystectomy   . Cardiac valve surgery   . Joint replacement     right hip and right femur x 2    PT Assessment/Plan/Recommendation PT Assessment Clinical Impression Statement: Pt presents with diagnosis of anemia. Pt also has recent history of R THA 02/2011 with ORIF for midshaft femur fracture in 03/2011. Pt has wound VAC in place on R hip s/p I&D prior to recent admission. Pt will benefit from skilled PT in the acute care setting to improve strength, activity tolerance,  gait and balance  in order to maximize independence with functional mobility. Pt will need to continue rehab at SNF.  PT Recommendation/Assessment: Patient will need skilled PT in the acute care venue PT Problem List: Decreased strength;Decreased range of motion;Decreased activity tolerance;Decreased balance;Decreased mobility;Pain;Decreased knowledge of precautions;Decreased  knowledge of use of DME PT Therapy Diagnosis : Difficulty walking;Abnormality of gait;Generalized weakness;Acute pain PT Plan PT Frequency: Min 5X/week PT Treatment/Interventions: DME instruction;Gait training;Functional mobility training;Therapeutic exercise;Patient/family education PT Recommendation Follow Up Recommendations: Skilled nursing facility Equipment Recommended: Defer to next venue PT Goals  Acute Rehab PT Goals PT Goal Formulation: With patient Pt will go Supine/Side to Sit: with supervision Pt will go Sit to Supine/Side: Other (comment) (Min-guard assist) Pt will Ambulate: 51 - 150 feet;with supervision;with rolling walker Pt will Perform Home Exercise Program: with supervision, verbal cues required/provided  PT Evaluation Precautions/Restrictions  Precautions Precautions: Posterior Hip Precaution Booklet Issued: No Precaution Comments: Pt able to recall 2/3 hip precautions-pt still needs to adhere to precautions secondary to recent R hip THA. Restrictions Weight Bearing Restrictions: Yes RLE Weight Bearing: Partial weight bearing RLE Partial Weight Bearing Percentage or Pounds: 50% Prior Functioning  Home Living Type of Home: [place] Prior Function Level of Independence: Needs assistance with ADLs;Needs assistance with gait;Needs assistance with tranfers Cognition Cognition Arousal/Alertness: Awake/alert Overall Cognitive Status: Appears within functional limits for tasks assessed Sensation/Coordination   Extremity Assessment RLE Assessment RLE Assessment: Not tested (Pt able to mobilize with assist. able to weight-bear.) LLE Assessment LLE Assessment: Within Functional Limits Mobility (including Balance) Bed Mobility Bed Mobility: Yes Supine to Sit: 4: Min assist;HOB elevated (Comment degrees);With rails Supine to Sit Details (indicate cue type and reason): VCs safety, technique. Assist for R LE off bed. Transfers Transfers: Yes  Sit to  Stand: 4: Min assist;From bed;With upper extremity assist;From elevated surface Sit to Stand Details (indicate cue type and reason): VCs safety, technique. Assist to rise, stabilize. Stand to Sit: 4: Min assist;To chair/3-in-1;With upper extremity assist;With armrests Stand to Sit Details: Assist to control descent. Ambulation/Gait Ambulation/Gait: Yes Ambulation/Gait Assistance: Other (comment) (Min-guard assist) Ambulation Distance (Feet): 60 Feet Assistive device: Rolling walker Gait Pattern: Step-to pattern;Decreased step length - right;Decreased stride length;Antalgic  Posture/Postural Control Posture/Postural Control: No significant limitations Exercise    End of Session PT - End of Session Equipment Utilized During Treatment: Gait belt Activity Tolerance: Patient limited by fatigue Patient left: in chair;with call bell in reach General Behavior During Session: [facility] for tasks performed Cognition: [facility] for tasks performed  [facility] 06/08/2011, 10:48 AM

## 2011-06-09 DIAGNOSIS — A0472 Enterocolitis due to Clostridium difficile, not specified as recurrent: Secondary | ICD-10-CM | POA: Diagnosis not present

## 2011-06-09 LAB — BASIC METABOLIC PANEL WITH GFR
BUN: 45 mg/dL — ABNORMAL HIGH (ref 6–23)
CO2: 23 meq/L (ref 19–32)
Calcium: 8.8 mg/dL (ref 8.4–10.5)
Chloride: 106 meq/L (ref 96–112)
Creatinine, Ser: 2.07 mg/dL — ABNORMAL HIGH (ref 0.50–1.10)
GFR calc Af Amer: 28 mL/min — ABNORMAL LOW
GFR calc non Af Amer: 25 mL/min — ABNORMAL LOW
Glucose, Bld: 76 mg/dL (ref 70–99)
Potassium: 3.3 meq/L — ABNORMAL LOW (ref 3.5–5.1)
Sodium: 138 meq/L (ref 135–145)

## 2011-06-09 LAB — DIFFERENTIAL
Basophils Absolute: 0.2 10*3/uL — ABNORMAL HIGH (ref 0.0–0.1)
Basophils Relative: 1 % (ref 0–1)
Eosinophils Absolute: 0.8 10*3/uL — ABNORMAL HIGH (ref 0.0–0.7)
Eosinophils Relative: 4 % (ref 0–5)
Lymphocytes Relative: 17 % (ref 12–46)
Lymphs Abs: 3.2 10*3/uL (ref 0.7–4.0)
Monocytes Absolute: 1.9 10*3/uL — ABNORMAL HIGH (ref 0.1–1.0)
Monocytes Relative: 10 % (ref 3–12)
Neutro Abs: 12.9 10*3/uL — ABNORMAL HIGH (ref 1.7–7.7)
Neutrophils Relative %: 68 % (ref 43–77)

## 2011-06-09 LAB — CBC
HCT: 26.6 % — ABNORMAL LOW (ref 36.0–46.0)
Hemoglobin: 8.4 g/dL — ABNORMAL LOW (ref 12.0–15.0)
MCH: 27.1 pg (ref 26.0–34.0)
MCHC: 31.6 g/dL (ref 30.0–36.0)
MCV: 85.8 fL (ref 78.0–100.0)
Platelets: 342 K/uL (ref 150–400)
RBC: 3.1 MIL/uL — ABNORMAL LOW (ref 3.87–5.11)
RDW: 19.9 % — ABNORMAL HIGH (ref 11.5–15.5)
WBC: 19 K/uL — ABNORMAL HIGH (ref 4.0–10.5)

## 2011-06-09 LAB — CLOSTRIDIUM DIFFICILE BY PCR: Toxigenic C. Difficile by PCR: POSITIVE — AB

## 2011-06-09 MED ORDER — METRONIDAZOLE IN NACL 5-0.79 MG/ML-% IV SOLN
500.0000 mg | Freq: Three times a day (TID) | INTRAVENOUS | Status: DC
Start: 2011-06-09 — End: 2011-06-10
  Administered 2011-06-09 – 2011-06-10 (×2): 500 mg via INTRAVENOUS
  Filled 2011-06-09 (×5): qty 100

## 2011-06-09 MED ORDER — SACCHAROMYCES BOULARDII 250 MG PO CAPS
250.0000 mg | ORAL_CAPSULE | Freq: Two times a day (BID) | ORAL | Status: DC
  Administered 2011-06-10 – 2011-06-11 (×4): 250 mg via ORAL
  Filled 2011-06-09 (×8): qty 1

## 2011-06-09 NOTE — Progress Notes (Signed)
Physical Therapy Treatment Patient Details Name: [name] MRN: [id number] DOB: [birth year] Today's Date: 06/09/2011 [id number] 2gt; 1ta PT Assessment/Plan  PT - Assessment/Plan Comments on Treatment Session: Pt feeling better overall today; discussed nutrition/intake/exercise with pt and husband--pt and husband initiated; PT Plan: Discharge plan remains appropriate;Frequency remains appropriate PT Frequency: Min 5X/week Follow Up Recommendations: Skilled nursing facility Equipment Recommended: Defer to next venue PT Goals  Acute Rehab PT Goals Time For Goal Achievement: 2 weeks Pt will Transfer Sit to Stand/Stand to Sit: with supervision PT Transfer Goal: Sit to Stand/Stand to Sit - Progress: Progressing toward goal PT Goal: Ambulate - Progress: Progressing toward goal PT Goal: Perform Home Exercise Program - Progress: Progressing toward goal  PT Treatment Precautions/Restrictions  Precautions Precautions: Posterior Hip Precaution Booklet Issued: No Precaution Comments: Pt able to recall 2/3 hip precautions-pt still needs to adhere to precautions secondary to recent R hip THA. Restrictions Weight Bearing Restrictions: Yes RLE Weight Bearing: Partial weight bearing RLE Partial Weight Bearing Percentage or Pounds: 50% Mobility (including Balance) Transfers Sit to Stand: 4: Min assist;With upper extremity assist;From chair/3-in-1 Sit to Stand Details (indicate cue type and reason): verbal cues initially for PWB, sequence Stand to Sit: 4: Min assist;To chair/3-in-1;With upper extremity assist;With armrests Stand to Sit Details: cues for RLE position/THP Ambulation/Gait Ambulation/Gait: Yes Ambulation/Gait Assistance:  (min/guard) Ambulation Distance (Feet): 65 Feet Assistive device: Rolling walker Gait Pattern: Step-to pattern (husband following with chair for safety)    Exercise  Total Joint Exercises Ankle Circles/Pumps: AROM;Both;10 reps Quad Sets: AROM;Both;15  reps End of Session PT - End of Session Equipment Utilized During Treatment: Gait belt Activity Tolerance: Patient limited by fatigue Patient left: in chair;with call bell in reach General Behavior During Session: [facility] for tasks performed Cognition: [facility] for tasks performed  [facility] 06/09/2011, 1:03 PM

## 2011-06-09 NOTE — Progress Notes (Signed)
Subjective: Patient patient still with loose stools. Patient states has had 2 episodes so far today.  Objective: Vital signs in last 24 hours: Filed Vitals:   06/08/11 2053 06/09/11 0526 06/09/11 0649 06/09/11 1300  BP: 123/68 117/65  123/80  Pulse: 76 82 99 87  Temp: 98 F (36.7 C) 98.1 F (36.7 C)  98.3 F (36.8 C)  TempSrc: Oral Oral    Resp: 18 19  20   Height:      Weight:      SpO2: 97% 82%  98%    Intake/Output Summary (Last 24 hours) at 06/09/11 1601 Last data filed at 06/09/11 0900  Gross per 24 hour  Intake    680 ml  Output    901 ml  Net   -221 ml    Weight change:   General: Alert, awake, oriented x3, in no acute distress. HEENT: No bruits, no goiter. Heart: Regular rate and rhythm, without murmurs, rubs, gallops. Lungs: Clear to auscultation bilaterally. Abdomen: Soft, nontender, nondistended, positive bowel sounds. Extremities: No clubbing cyanosis. RLE with 2-3+ edema and hip wound with wound VAC . Lle with trace to 1+ edema. Neuro: Grossly intact, nonfocal.    Lab Results:  Basename 06/09/11 0530 06/08/11 0450 06/06/11 2130  NA 138 141 --  K 3.3* 3.5 --  CL 106 110 --  CO2 23 24 --  GLUCOSE 76 91 --  BUN 45* 47* --  CREATININE 2.07* 1.89* --  CALCIUM 8.8 8.8 --  MG -- -- 1.4*  PHOS -- -- --    Basename 06/06/11 2130  AST 18  ALT 14  ALKPHOS 161*  BILITOT 0.2*  PROT 5.8*  ALBUMIN 2.3*   No results found for this basename: LIPASE:2,AMYLASE:2 in the last 72 hours  Basename 06/09/11 0530 06/08/11 0450  WBC 19.0* 19.6*  NEUTROABS 12.9* --  HGB 8.4* 8.3*  HCT 26.6* 26.6*  MCV 85.8 84.7  PLT 342 333   No results found for this basename: CKTOTAL:3,CKMB:3,CKMBINDEX:3,TROPONINI:3 in the last 72 hours No results found for this basename: POCBNP:3 in the last 72 hours No results found for this basename: DDIMER:2 in the last 72 hours No results found for this basename: HGBA1C:2 in the last 72 hours No results found for this basename:  CHOL:2,HDL:2,LDLCALC:2,TRIG:2,CHOLHDL:2,LDLDIRECT:2 in the last 72 hours No results found for this basename: TSH,T4TOTAL,FREET3,T3FREE,THYROIDAB in the last 72 hours  Basename 06/06/11 1900  VITAMINB12 1326*  FOLATE 5.6  FERRITIN 234  TIBC 155*  IRON 12*  RETICCTPCT 3.6*    Micro Results: Recent Results (from the past 240 hour(s))  MRSA PCR SCREENING     Status: Normal   Collection Time   06/07/11  1:07 AM      Component Value Range Status Comment   MRSA by PCR NEGATIVE  NEGATIVE  Final   CULTURE, BLOOD (ROUTINE X 2)     Status: Normal (Preliminary result)   Collection Time   06/08/11 12:38 PM      Component Value Range Status Comment   Specimen Description BLOOD LEFT ARM   Final    Special Requests BOTTLES DRAWN AEROBIC AND ANAEROBIC 10CC   Final    Setup Time [id number]   Final    Culture     Final    Value:        BLOOD CULTURE RECEIVED NO GROWTH TO DATE CULTURE WILL BE HELD FOR 5 DAYS BEFORE ISSUING A FINAL NEGATIVE REPORT   Report Status PENDING   Incomplete   CULTURE,  BLOOD (ROUTINE X 2)     Status: Normal (Preliminary result)   Collection Time   06/08/11 12:48 PM      Component Value Range Status Comment   Specimen Description BLOOD LEFT HAND   Final    Special Requests BOTTLES DRAWN AEROBIC AND ANAEROBIC 10CC   Final    Setup Time [id number]   Final    Culture     Final    Value:        BLOOD CULTURE RECEIVED NO GROWTH TO DATE CULTURE WILL BE HELD FOR 5 DAYS BEFORE ISSUING A FINAL NEGATIVE REPORT   Report Status PENDING   Incomplete   CLOSTRIDIUM DIFFICILE BY PCR     Status: Abnormal   Collection Time   06/08/11  1:21 PM      Component Value Range Status Comment   C difficile by pcr POSITIVE (*) NEGATIVE  Final     Studies/Results: No results found.  Medications:     . allopurinol  300 mg Oral Daily  . aspirin  325 mg Oral BID  . cefTRIAXone (ROCEPHIN) IV  1 g Intravenous Q24H  . colchicine  0.6 mg Oral Daily  . diltiazem  300 mg Oral Daily  .  doxercalciferol  1 mcg Oral Daily  . ferrous sulfate  325 mg Oral TID WC  . furosemide  80 mg Oral BID  . gabapentin  300 mg Oral TID  . methocarbamol  500 mg Oral QID  . metoprolol  100 mg Oral Daily  . metronidazole  500 mg Intravenous Q8H  . multivitamins ther. w/minerals  1 tablet Oral Daily  . nystatin   Topical TID  . pantoprazole  40 mg Oral Q0600  . potassium chloride  40 mEq Oral Daily  . protein supplement  30 mL Oral BID  . sertraline  50 mg Oral Daily  . sodium chloride  5 mL Intravenous QPM  . sotalol  80 mg Oral BID  . DISCONTD: potassium chloride SA  20 mEq Oral Daily    Assessment/Plan Principal Problem:  *Anemia  Clostridium difficile colitis Active Problems: Leukocytosis  Hypokalemia  ARF (acute renal failure)  UTI  1. Iron deficiency Anemia/AOCD - Likely multifactorial sec to CKD and recent hip surgery. Repeat guaic negative. H/H stable. Patient seen by GI and recommend outpatient followup. Continue iron supplements. PPI. 2. Clostridium difficile colitis- patient with a loose stools. C. difficile PCR which was checked yesterday and was positive. Place on Flagyl IV patient will need a total of 10-14 days treatment. Florastor 1 tablet BID. 3. leukocytosis- likely secondary to problem #2 and urinary tract infection. White count of approximately 19,000 today. Patient currently being treated for urinary tract infection. Urine cultures are pending. See #2.  blood cultures pending .Continue Rocephin for now day #3.  4. Hypokalemia - likely secondary to diuretics. Repleted 5. UTI- urine cultures pending. IV Rocephin D3 6. CKD - Looking at records seems patient's baseline is aprox 1.6-1.8 per renal. Currently close to baseline.  Continue to hold ARB. Continue Lasix lasix at 1/2 home dose of 80mg  BID lasix. Followup with renal as outpatient. 7. R hip seroma s/p I and D - Wound vac . Patient seen by ortho and ff. 8. HTN- Stable . Continue current regimen. 9. Gout -  continue allopurinol and decrease colchicine to daily. 10.. ?? Candida - improved per patient. Nystatin powder. D/C diflucan after today's dose. 11..Afib - sotalol and metoprolol for rate control. ASA . 12. Prophylaxis -  PPI for GI, SCD for DVT.    LOS: 3 days   THOMPSON,DANIEL 06/09/2011, 4:01 PM

## 2011-06-09 NOTE — Progress Notes (Signed)
Patient was tentatively scheduled for discharge today back to [facility]. Spoke with Dr. Grandville Silos- d/c delayed this weekend; patient will start IV Flagyl for treatment of C-diff.  At d/c (hopefully Monday)- will change to oral Flagyl.  Notified [name] supervisor and patient of above. SW will folllow-up on Monday with SNF to facilitate d/c if stable per MD.  Kendell Bane T, BSW,06/09/2011 11:41 AM

## 2011-06-10 LAB — TYPE AND SCREEN
ABO/RH(D): O POS
Antibody Screen: NEGATIVE
Unit division: 0
Unit division: 0

## 2011-06-10 LAB — BASIC METABOLIC PANEL
BUN: 47 mg/dL — ABNORMAL HIGH (ref 6–23)
CO2: 23 mEq/L (ref 19–32)
Calcium: 8.9 mg/dL (ref 8.4–10.5)
Chloride: 104 mEq/L (ref 96–112)
Creatinine, Ser: 2.28 mg/dL — ABNORMAL HIGH (ref 0.50–1.10)
GFR calc Af Amer: 25 mL/min — ABNORMAL LOW (ref >90)
GFR calc non Af Amer: 22 mL/min — ABNORMAL LOW (ref >90)
Glucose, Bld: 91 mg/dL (ref 70–99)
Potassium: 3.3 mEq/L — ABNORMAL LOW (ref 3.5–5.1)
Sodium: 136 mEq/L (ref 135–145)

## 2011-06-10 LAB — CBC
HCT: 27.8 % — ABNORMAL LOW (ref 36.0–46.0)
Hemoglobin: 8.7 g/dL — ABNORMAL LOW (ref 12.0–15.0)
MCH: 26.4 pg (ref 26.0–34.0)
MCHC: 31.3 g/dL (ref 30.0–36.0)
MCV: 84.5 fL (ref 78.0–100.0)
Platelets: 375 10*3/uL (ref 150–400)
RBC: 3.29 MIL/uL — ABNORMAL LOW (ref 3.87–5.11)
RDW: 19.8 % — ABNORMAL HIGH (ref 11.5–15.5)
WBC: 14.8 10*3/uL — ABNORMAL HIGH (ref 4.0–10.5)

## 2011-06-10 LAB — DIFFERENTIAL
Basophils Absolute: 0.1 10*3/uL (ref 0.0–0.1)
Basophils Relative: 1 % (ref 0–1)
Eosinophils Absolute: 1 10*3/uL — ABNORMAL HIGH (ref 0.0–0.7)
Eosinophils Relative: 7 % — ABNORMAL HIGH (ref 0–5)
Lymphocytes Relative: 21 % (ref 12–46)
Lymphs Abs: 3.1 10*3/uL (ref 0.7–4.0)
Monocytes Absolute: 1.6 10*3/uL — ABNORMAL HIGH (ref 0.1–1.0)
Monocytes Relative: 11 % (ref 3–12)
Neutro Abs: 9 10*3/uL — ABNORMAL HIGH (ref 1.7–7.7)
Neutrophils Relative %: 61 % (ref 43–77)

## 2011-06-10 LAB — MAGNESIUM: Magnesium: 1.6 mg/dL (ref 1.5–2.5)

## 2011-06-10 MED ORDER — POTASSIUM CHLORIDE CRYS ER 20 MEQ PO TBCR
40.0000 meq | EXTENDED_RELEASE_TABLET | ORAL | Status: AC
  Administered 2011-06-10: 40 meq via ORAL
  Filled 2011-06-10 (×2): qty 2

## 2011-06-10 MED ORDER — METRONIDAZOLE 500 MG PO TABS
500.0000 mg | ORAL_TABLET | Freq: Three times a day (TID) | ORAL | Status: DC
  Administered 2011-06-10 – 2011-06-11 (×5): 500 mg via ORAL
  Filled 2011-06-10 (×10): qty 1

## 2011-06-10 NOTE — Progress Notes (Signed)
Physical Therapy Treatment Patient Details Name: [name] MRN: [id number] DOB: 02/29/1946 Todays Date: 06/10/2011 1436-1500 1ta 1gt PT Assessment/Plan  PT - Assessment/Plan Comments on Treatment Session: pt still with some loose BMs; assisted to bathroom with hygiene and gown change; pt pleasant and motivated but activity limited by pain today; good standing tolerance and static stand without UE support--close guard by PT sedondary to pt hx of falls PT Plan: Discharge plan remains appropriate;Frequency remains appropriate PT Frequency: Min 5X/week Follow Up Recommendations: Skilled nursing facility Equipment Recommended: Defer to next venue PT Goals  Acute Rehab PT Goals PT Transfer Goal: Sit to Stand/Stand to Sit - Progress: Progressing toward goal PT Goal: Ambulate - Progress: Progressing toward goal  PT Treatment Precautions/Restrictions  Precautions Precautions: Posterior Hip Precaution Booklet Issued: No Precaution Comments: Pt able to recall 2/3 hip precautions-pt still needs to adhere to precautions secondary to recent R hip THA. Restrictions Weight Bearing Restrictions: Yes RLE Weight Bearing: Partial weight bearing RLE Partial Weight Bearing Percentage or Pounds: 50% Mobility (including Balance) Transfers Sit to Stand: 4: Min assist;With upper extremity assist;From chair/3-in-1 Sit to Stand Details (indicate cue type and reason): cues for RLE position Stand to Sit: 4: Min assist;To chair/3-in-1;With upper extremity assist;With armrests Stand to Sit Details: same Ambulation/Gait Ambulation/Gait Assistance: 4: Min assist;5: Supervision Ambulation/Gait Assistance Details (indicate cue type and reason): cues for PWB and sequence, RW safety Ambulation Distance (Feet):  (15' x 2) Assistive device: Rolling walker Gait Pattern: Step-to pattern    Exercise  Total Joint Exercises Ankle Circles/Pumps: AROM;Both;10 reps End of Session PT - End of Session Equipment  Utilized During Treatment: Gait belt Activity Tolerance: Patient limited by pain Patient left: in chair;with call bell in reach Nurse Communication:  (pain meds) General Behavior During Session: [facility] for tasks performed Cognition: [facility] for tasks performed  [facility] 06/10/2011, 3:13 PM

## 2011-06-10 NOTE — Progress Notes (Signed)
Subjective: [name] [name]  With 2 loose stools today. No abdominal pain. No complaints.  Objective: Vital signs in last 24 hours: Filed Vitals:   06/09/11 1300 06/09/11 2104 06/10/11 0512 06/10/11 1410  BP: 123/80 135/65 119/69 125/78  Pulse: 87 75 76 75  Temp: 98.3 F (36.8 C) 98.2 F (36.8 C) 97.4 F (36.3 C) 98.2 F (36.8 C)  TempSrc:  Axillary Oral Oral  Resp: 20 18 19 18   Height:      Weight:      SpO2: 98% 98% 99% 100%    Intake/Output Summary (Last 24 hours) at 06/10/11 1539 Last data filed at 06/10/11 1411  Gross per 24 hour  Intake    970 ml  Output   1750 ml  Net   -780 ml    Weight change:   General: Alert, awake, oriented x3, in no acute distress. HEENT: No bruits, no goiter. Heart: Regular rate and rhythm, without murmurs, rubs, gallops. Lungs: Clear to auscultation bilaterally. Abdomen: Soft, nontender, nondistended, positive bowel sounds. Extremities: No clubbing cyanosis. RLE with 2-3+ edema and hip wound with wound VAC . Lle with trace edema. Neuro: Grossly intact, nonfocal.    Lab Results:  Basename 06/10/11 0500 06/09/11 0530  NA 136 138  K 3.3* 3.3*  CL 104 106  CO2 23 23  GLUCOSE 91 76  BUN 47* 45*  CREATININE 2.28* 2.07*  CALCIUM 8.9 8.8  MG 1.6 --  PHOS -- --   No results found for this basename: AST:2,ALT:2,ALKPHOS:2,BILITOT:2,PROT:2,ALBUMIN:2 in the last 72 hours No results found for this basename: LIPASE:2,AMYLASE:2 in the last 72 hours  Basename 06/10/11 0500 06/09/11 0530  WBC 14.8* 19.0*  NEUTROABS 9.0* 12.9*  HGB 8.7* 8.4*  HCT 27.8* 26.6*  MCV 84.5 85.8  PLT 375 342   No results found for this basename: CKTOTAL:3,CKMB:3,CKMBINDEX:3,TROPONINI:3 in the last 72 hours No results found for this basename: POCBNP:3 in the last 72 hours No results found for this basename: DDIMER:2 in the last 72 hours No results found for this basename: HGBA1C:2 in the last 72 hours No results found for this basename:  CHOL:2,HDL:2,LDLCALC:2,TRIG:2,CHOLHDL:2,LDLDIRECT:2 in the last 72 hours No results found for this basename: TSH,T4TOTAL,FREET3,T3FREE,THYROIDAB in the last 72 hours No results found for this basename: VITAMINB12:2,FOLATE:2,FERRITIN:2,TIBC:2,IRON:2,RETICCTPCT:2 in the last 72 hours  Micro Results: Recent Results (from the past 240 hour(s))  MRSA PCR SCREENING     Status: Normal   Collection Time   06/07/11  1:07 AM      Component Value Range Status Comment   MRSA by PCR NEGATIVE  NEGATIVE  Final   CULTURE, BLOOD (ROUTINE X 2)     Status: Normal (Preliminary result)   Collection Time   06/08/11 12:38 PM      Component Value Range Status Comment   Specimen Description BLOOD LEFT ARM   Final    Special Requests BOTTLES DRAWN AEROBIC AND ANAEROBIC 10CC   Final    Setup Time [id number]   Final    Culture     Final    Value:        BLOOD CULTURE RECEIVED NO GROWTH TO DATE CULTURE WILL BE HELD FOR 5 DAYS BEFORE ISSUING A FINAL NEGATIVE REPORT   Report Status PENDING   Incomplete   CULTURE, BLOOD (ROUTINE X 2)     Status: Normal (Preliminary result)   Collection Time   06/08/11 12:48 PM      Component Value Range Status Comment   Specimen Description BLOOD LEFT HAND  Final    Special Requests BOTTLES DRAWN AEROBIC AND ANAEROBIC 10CC   Final    Setup Time [id number]   Final    Culture     Final    Value:        BLOOD CULTURE RECEIVED NO GROWTH TO DATE CULTURE WILL BE HELD FOR 5 DAYS BEFORE ISSUING A FINAL NEGATIVE REPORT   Report Status PENDING   Incomplete   CLOSTRIDIUM DIFFICILE BY PCR     Status: Abnormal   Collection Time   06/08/11  1:21 PM      Component Value Range Status Comment   C difficile by pcr POSITIVE (*) NEGATIVE  Final     Studies/Results: No results found.  Medications:     . allopurinol  300 mg Oral Daily  . aspirin  325 mg Oral BID  . cefTRIAXone (ROCEPHIN) IV  1 g Intravenous Q24H  . colchicine  0.6 mg Oral Daily  . diltiazem  300 mg Oral Daily  .  doxercalciferol  1 mcg Oral Daily  . ferrous sulfate  325 mg Oral TID WC  . furosemide  80 mg Oral BID  . gabapentin  300 mg Oral TID  . methocarbamol  500 mg Oral QID  . metoprolol  100 mg Oral Daily  . metroNIDAZOLE  500 mg Oral Q8H  . multivitamins ther. w/minerals  1 tablet Oral Daily  . nystatin   Topical TID  . pantoprazole  40 mg Oral Q0600  . potassium chloride  40 mEq Oral Daily  . potassium chloride  40 mEq Oral Q4H  . protein supplement  30 mL Oral BID  . saccharomyces boulardii  250 mg Oral BID  . sertraline  50 mg Oral Daily  . sodium chloride  5 mL Intravenous QPM  . sotalol  80 mg Oral BID  . DISCONTD: metronidazole  500 mg Intravenous Q8H    Assessment/Plan Principal Problem:  *Anemia  Clostridium difficile colitis Active Problems: Leukocytosis  Hypokalemia  ARF (acute renal failure)  UTI  1. Iron deficiency Anemia/AOCD - Likely multifactorial sec to CKD and recent hip surgery. Repeat guaic negative. H/H stable. [name] seen by GI and recommend outpatient followup. Continue iron supplements. PPI. 2. Clostridium difficile colitis- [name] with 2 loose stools today. C. difficile PCR  positive. Change Flagyl IV to oral,  [name] will need a total of 10-14 days treatment. Florastor 1 tablet BID. 3. leukocytosis- likely secondary to problem #2 and urinary tract infection. White count trending down [name] currently being treated for urinary tract infection. Urine cultures are pending. See #2.  blood cultures pending .Continue Rocephin for now day #4. Continue oral Flagyl. 4. Hypokalemia - likely secondary to diuretics. Replete 5. UTI- urine cultures pending. IV Rocephin D4 6. CKD - Looking at records seems [name]'s baseline is aprox 1.6-1.8 per renal. Currently close to baseline.  Continue to hold ARB. Continue Lasix lasix at 1/2 home dose of 80mg  BID lasix. Followup with renal as outpatient. 7. R hip seroma s/p I and D - Wound vac . [name] seen by ortho and ff. 8.  HTN- Stable . Continue current regimen. 9. Gout - continue allopurinol and decrease colchicine to daily. 10.. ?? Candida - improved per [name]. Nystatin powder. D/C diflucan after today's dose. 11..Afib - sotalol and metoprolol for rate control. ASA . 12. Prophylaxis - PPI for GI, SCD for DVT.    LOS: 4 days   [facility] 06/10/2011, 3:39 PM

## 2011-06-11 DIAGNOSIS — T792XXA Traumatic secondary and recurrent hemorrhage and seroma, initial encounter: Secondary | ICD-10-CM | POA: Diagnosis present

## 2011-06-11 LAB — BASIC METABOLIC PANEL
BUN: 45 mg/dL — ABNORMAL HIGH (ref 6–23)
CO2: 23 mEq/L (ref 19–32)
Calcium: 8.6 mg/dL (ref 8.4–10.5)
Chloride: 109 mEq/L (ref 96–112)
Creatinine, Ser: 2.21 mg/dL — ABNORMAL HIGH (ref 0.50–1.10)
GFR calc Af Amer: 26 mL/min — ABNORMAL LOW (ref >90)
GFR calc non Af Amer: 23 mL/min — ABNORMAL LOW (ref >90)
Glucose, Bld: 86 mg/dL (ref 70–99)
Potassium: 3.6 mEq/L (ref 3.5–5.1)
Sodium: 140 mEq/L (ref 135–145)

## 2011-06-11 LAB — CBC
HCT: 26.9 % — ABNORMAL LOW (ref 36.0–46.0)
Hemoglobin: 8.4 g/dL — ABNORMAL LOW (ref 12.0–15.0)
MCH: 26.8 pg (ref 26.0–34.0)
MCHC: 31.2 g/dL (ref 30.0–36.0)
MCV: 85.7 fL (ref 78.0–100.0)
Platelets: 395 10*3/uL (ref 150–400)
RBC: 3.14 MIL/uL — ABNORMAL LOW (ref 3.87–5.11)
RDW: 19.6 % — ABNORMAL HIGH (ref 11.5–15.5)
WBC: 11.5 10*3/uL — ABNORMAL HIGH (ref 4.0–10.5)

## 2011-06-11 LAB — DIFFERENTIAL
Basophils Absolute: 0.1 10*3/uL (ref 0.0–0.1)
Basophils Relative: 1 % (ref 0–1)
Eosinophils Absolute: 0.6 10*3/uL (ref 0.0–0.7)
Eosinophils Relative: 6 % — ABNORMAL HIGH (ref 0–5)
Lymphocytes Relative: 25 % (ref 12–46)
Lymphs Abs: 2.9 10*3/uL (ref 0.7–4.0)
Monocytes Absolute: 1.5 10*3/uL — ABNORMAL HIGH (ref 0.1–1.0)
Monocytes Relative: 13 % — ABNORMAL HIGH (ref 3–12)
Neutro Abs: 6.4 10*3/uL (ref 1.7–7.7)
Neutrophils Relative %: 55 % (ref 43–77)

## 2011-06-11 MED ORDER — NYSTATIN 100000 UNIT/GM EX POWD: CUTANEOUS | Status: DC

## 2011-06-11 MED ORDER — SACCHAROMYCES BOULARDII 250 MG PO CAPS: 250.0000 mg | ORAL_CAPSULE | Freq: Two times a day (BID) | ORAL | Status: AC

## 2011-06-11 MED ORDER — HYDROCODONE-ACETAMINOPHEN 7.5-325 MG PO TABS: 2.0000 | ORAL_TABLET | ORAL | Status: DC | PRN

## 2011-06-11 MED ORDER — COLCHICINE 0.6 MG PO TABS: 0.6000 mg | ORAL_TABLET | Freq: Every day | ORAL | Status: DC

## 2011-06-11 MED ORDER — HEPARIN SOD (PORK) LOCK FLUSH 100 UNIT/ML IV SOLN
250.0000 [IU] | INTRAVENOUS | Status: DC | PRN
  Administered 2011-06-11: 250 [IU]
  Filled 2011-06-11: qty 3

## 2011-06-11 MED ORDER — SODIUM CHLORIDE 0.9 % IV SOLN
INTRAVENOUS | Status: DC
Start: 2011-06-10 — End: 2011-06-11

## 2011-06-11 MED ORDER — PRO-STAT SUGAR FREE PO LIQD
30.0000 mL | Freq: Two times a day (BID) | ORAL | Status: DC
  Administered 2011-06-11: 30 mL via ORAL
  Filled 2011-06-11 (×4): qty 30

## 2011-06-11 MED ORDER — METRONIDAZOLE 500 MG PO TABS: 500.0000 mg | ORAL_TABLET | Freq: Three times a day (TID) | ORAL | Status: AC

## 2011-06-11 MED ORDER — HEPARIN (PORCINE) LOCK FLUSH 10 UNIT/ML IV SOLN
250.0000 [IU] | Freq: Once | INTRAVENOUS | Status: DC
  Filled 2011-06-11: qty 25

## 2011-06-11 NOTE — Progress Notes (Signed)
Physical Therapy Treatment Patient Details Name: [name] MRN: [id number] DOB: 1946-03-04 Today's Date: 06/11/2011 10:35 - 11;00 2 gt PT Assessment/Plan  PT - Assessment/Plan Comments on Treatment Session: pt plans to return to [facility] today PT Plan: Discharge plan remains appropriate PT Frequency: Min 5X/week Follow Up Recommendations: Skilled nursing facility Equipment Recommended: Defer to next venue PT Goals  Acute Rehab PT Goals PT Goal Formulation: With patient Pt will go Supine/Side to Sit: with supervision PT Goal: Supine/Side to Sit - Progress: Progressing toward goal Pt will go Sit to Supine/Side:  (MinGuard) PT Goal: Sit to Supine/Side - Progress: Progressing toward goal Pt will Transfer Sit to Stand/Stand to Sit: with supervision PT Transfer Goal: Sit to Stand/Stand to Sit - Progress: Progressing toward goal Pt will Ambulate: 51 - 150 feet;with supervision;with rolling walker PT Goal: Ambulate - Progress: Progressing toward goal Pt will Perform Home Exercise Program: with supervision, verbal cues required/provided PT Goal: Perform Home Exercise Program - Progress: Progressing toward goal  PT Treatment Precautions/Restrictions  Precautions Precautions: Posterior Hip Precaution Booklet Issued: No Precaution Comments: Pt able to recall 2/3 hip precautions-pt still needs to adhere to precautions secondary to recent R hip THA. Restrictions Weight Bearing Restrictions: No RLE Weight Bearing: Weight bearing as tolerated RLE Partial Weight Bearing Percentage or Pounds: MD upgraded pt to WBAT today Mobility (including Balance) Bed Mobility Bed Mobility: No Transfers Transfers: Yes Sit to Stand: 4: Min assist Sit to Stand Details (indicate cue type and reason): increased time with 2nd assist for equipment (IV, wound vac & chair to follow) Stand to Sit: 4: Min assist Ambulation/Gait Ambulation/Gait: Yes (amb twice with one sitting rest  break) Ambulation/Gait Assistance:  (MinGuard assist with chair following behind for safety) Ambulation/Gait Assistance Details (indicate cue type and reason): pt upgraded to Myles Lipps by MD Ambulation Distance (Feet): 50.45 Feet (amb twice) Assistive device: Rolling walker Gait Pattern: Step-through pattern Stairs: No Wheelchair Mobility Wheelchair Mobility: No    Exercise    End of Session PT - End of Session Equipment Utilized During Treatment: Gait belt Activity Tolerance: Patient tolerated treatment well Patient left: in chair;with call bell in reach;with family/visitor present Nurse Communication: Mobility status for ambulation General Behavior During Session: [facility] for tasks performed Cognition: [facility] for tasks performed Rica Koyanagi PTA [facility]  Acute  Rehab Pager     [phone]

## 2011-06-11 NOTE — Discharge Summary (Signed)
Discharge Summary  [name] MR#: [id number]  DOB:1945-07-24  Date of Admission: 06/06/2011 Date of Discharge: 06/11/2011  Patient's PCP: Garnet Koyanagi, DO, DO  Attending Physician:THOMPSON,DANIEL  Consults: Treatment Team: Orthopedics: [name]  gastroenterology: Dr. Delfin Edis Nephrology: Dr. Corliss Parish    Discharge Diagnoses: Clostridium difficile colitis Present on Admission:  .Anemia .Hypokalemia .ARF (acute renal failure) on chronic kidney disease baseline creatinine 1.6-2 Right Hip Seroma s/p I & D with wound vac 04/30/11   Brief Admitting History and Physical [name] is a 65 year old African American female who was at the skilled nursing facility sent to the emergency department secondary to anemia and for blood transfusion. Patient had recent hip surgery with a right hip wound and has been recovering from that with generalized weakness which is unchanged. Joint patient's last hospitalization in October she was transfused 3 units of packed) cells at that time she did have a hemoglobin of 6.5. Followup hemoglobin was 8.9 on November 13. Repeat hemoglobin was done one day prior to admission and per nursing home was down to 6.5 and a such patient was sent to the emergency room to be transfused. Patient denies any chest pain, denies shortness of breath, denies any change in her generalized weakness, denies any fever, no chills, no abdominal pain, no dysuria, no bright red blood per rectum, no hematochezia. Patient endorses dark stools however is on oral iron. Patient is also complaining of some skin breakdown between her legs. Patient also with decreased oral intake. Patient states that she was on a wound VAC at the facility however this was removed prior to her presenting to the ED and will need a wound VAC. Patient endorses decreased appetite and decreased oral intake. Patient was seen in the emergency room, CBC which was done did have a hemoglobin of 9.2  a.m. be met which was done did have a potassium of 2.9 and a creatinine level of 2.20 with a BUN of 70. We were called to admit the patient for further evaluation and management. For the rest of the admission history and physical please see H&P dictated by Dr. Irine Seal   Discharge Medications Current Discharge Medication List    START taking these medications   Details  metroNIDAZOLE (FLAGYL) 500 MG tablet Take 1 tablet (500 mg total) by mouth every 8 (eight) hours. Qty: 30 tablet, Refills: 0    nystatin (NYSTOP) 100000 UNIT/GM POWD Apply to affected area 3 times daily Qty: 15 g, Refills: 0    saccharomyces boulardii (FLORASTOR) 250 MG capsule Take 1 capsule (250 mg total) by mouth 2 (two) times daily. Qty: 20 capsule, Refills: 0      CONTINUE these medications which have CHANGED   Details  colchicine 0.6 MG tablet Take 1 tablet (0.6 mg total) by mouth daily. Qty: 31 tablet, Refills: 0    HYDROcodone-acetaminophen (NORCO) 7.5-325 MG per tablet Take 2 tablets by mouth every 4 (four) hours as needed. pain Qty: 30 tablet, Refills: 0      CONTINUE these medications which have NOT CHANGED   Details  acetaminophen (TYLENOL) 500 MG tablet Take 500 mg by mouth as needed.      allopurinol (ZYLOPRIM) 100 MG tablet Take 300 mg by mouth daily. 3 tabs daily    aspirin 325 MG tablet Take 325 mg by mouth 2 (two) times daily.      diltiazem (CARDIZEM CD) 300 MG 24 hr capsule Take 300 mg by mouth daily.      diphenhydrAMINE (  BENADRYL) 25 MG tablet Take 25 mg by mouth every 6 (six) hours as needed. Itching/rash     doxercalciferol (HECTOROL) 0.5 MCG capsule Take 1 mcg by mouth daily.      ferrous sulfate 325 (65 FE) MG tablet Take 325 mg by mouth 3 (three) times daily with meals.      furosemide (LASIX) 80 MG tablet Take 80 mg by mouth 2 (two) times daily. 2 tab in am and 1 tab     gabapentin (NEURONTIN) 300 MG capsule Take 300 mg by mouth 3 (three) times daily.     methocarbamol  (ROBAXIN) 500 MG tablet Take 500 mg by mouth 4 (four) times daily.      metoprolol (TOPROL XL) 100 MG 24 hr tablet Take 100 mg by mouth daily.      Multiple Vitamins-Minerals (MULTIVITAMINS THER. W/MINERALS) TABS Take 1 tablet by mouth daily.      potassium chloride SA (KLOR-CON M20) 20 MEQ tablet Take 20 mEq by mouth daily.      protein supplement (PROSOURCE NO CARB) LIQD Take 30 mLs by mouth 2 (two) times daily.      sertraline (ZOLOFT) 50 MG tablet Take 1 tablet (50 mg total) by mouth daily. Qty: 30 tablet, Refills: 2    sodium chloride 0.9 % injection Inject 5 mLs into the vein every evening.      sotalol (BETAPACE) 80 MG tablet Take 80 mg by mouth 2 (two) times daily.        STOP taking these medications     docusate sodium (COLACE) 100 MG capsule      telmisartan (MICARDIS) 80 MG tablet         Hospital Course: #1 Clostridium difficile colitis  During this hospitalization patient had remained stable. On 06/09/2011 it was noted that patient's white cell count had significantly increased to 19,000. Patient was already on IV Rocephin for treatment of probable UTI. On interview patient did state that she had some loose stools. A C. difficile PCR was subsequently obtained which came back positive for C. difficile colitis. Patient was subsequently placed initially on IV Flagyl and monitored. Patient's leukocytosis improved she was also placed on the florastor. Patient had approximately 2 stools per day she improved clinically remained afebrile and will be discharged back to the skilled nursing facility on oral Flagyl for 10 more days. Patient's leukocytosis on day of discharge was down to 11.5. Patient will followup with M.D. at the skilled nursing facility.   Present on Admission:   #2 Anemia-  Patient was sent to the emergency room from the skilled nursing facility secondary to a hemoglobin of 6.5. In the emergency room a repeat CBC which was done did show a hemoglobin of 9.2. Per  ED physician patient's FOBT was positive. Patient was admitted to the telemetry floor .a repeat FOBT which was done was negative. A GI consultation was obtained. Patient was seen in consultation by Dr. Delfin Edis of the bowel a GI on 06/07/2011. It was felt per GI that patient's anemia was likely multi-factorial secondary to her chronic kidney disease and recent hip surgery. Per GI patient was up-to-date on her colonoscopy. It was felt no further GI workup was needed and patient was to followup with Dr. Collene Mares her gastroenterologist as outpatient. Patient's hemoglobin remained stable throughout the hospitalization. And patient will be discharged in stable and improved condition.   #3 Hypokalemia-patient was noted to be hypokalemic during the hospitalization felt to be secondary to her diuretics. Patient's  potassium was repleted and she will be discharged on potassium supplements.   #4 ARF (acute renal failure) on CKD On admission patient was noted to have a creatinine of 2.2. It was felt patient was in acute on chronic renal failure. Patient's micardis this was discontinued and her Lasix was held. A renal consultation was obtained per family request patient was seen in consultation by Dr. Vanetta Mulders of [facility]. Nephrology was in agreement with patient's management and recommended not to resume patient my card is until she follows up as outpatient with her nephrologist. Patient's dose of Lasix was resumed but at a lower dose. Patient's renal function remained stable and she will followup with her nephrologist as an outpatient. #5 right hip seroma status post I&D with wound VAC 04/30/2011- Patient was noted to have had recent right hip surgery and as such a orthopedic consultation was obtained. Patient was seen in consultation by Dr. Paralee Cancel 06/07/2011. It was felt per orthopedics that patient's wound was improving with no signs of infection, and patient was partial weightbearing  on the right lower extremity 50%. Patient was followed by physical therapy during the hospitalization and per Dr. Alvan Dame after reviewing the x-rays which were done head activity was increased to weight-bearing as tolerated on the right lower extremity. Patient is to followup with Dr. Alvan Dame on December 7 and will be discharged to a skilled nursing facility with the wound VAC.  #6 UTI -urinalysis which was done on admission was consistent with a urinary tract infection. Patient has received 5 days of IV Rocephin and has been adequately treated.  Day of Discharge BP 114/72  Pulse 93  Temp(Src) 97.6 F (36.4 C) (Oral)  Resp 18  Ht 5\' 6"  (1.676 m)  Wt 118.3 kg (260 lb 12.9 oz)  BMI 42.09 kg/m2  SpO2 100% Subjective: Patient states has had only one bowel movement this morning. Patient denies any abdominal pain. Patient feels better. Objective: Gen. no acute distress. Respiratory: Lungs clear to auscultation bilaterally. Cardiovascular: Regular rate rhythm no murmurs rubs or gallops. Abdomen: Soft, nontender, nondistended, positive bowel sounds. Extremities: No clubbing or cyanosis. Right lower extremity with 2-3+ edema. Right hip with wound VAC. Left lower extremity with trace edema.  Results for orders placed during the hospital encounter of 06/06/11 (from the past 48 hour(s))  CBC     Status: Abnormal   Collection Time   06/10/11  5:00 AM      Component Value Range Comment   WBC 14.8 (*) 4.0 - 10.5 (K/uL)    RBC 3.29 (*) 3.87 - 5.11 (MIL/uL)    Hemoglobin 8.7 (*) 12.0 - 15.0 (g/dL)    HCT 27.8 (*) 36.0 - 46.0 (%)    MCV 84.5  78.0 - 100.0 (fL)    MCH 26.4  26.0 - 34.0 (pg)    MCHC 31.3  30.0 - 36.0 (g/dL)    RDW 19.8 (*) 11.5 - 15.5 (%)    Platelets 375  150 - 400 (K/uL)   DIFFERENTIAL     Status: Abnormal   Collection Time   06/10/11  5:00 AM      Component Value Range Comment   Neutrophils Relative 61  43 - 77 (%)    Neutro Abs 9.0 (*) 1.7 - 7.7 (K/uL)    Lymphocytes Relative 21   12 - 46 (%)    Lymphs Abs 3.1  0.7 - 4.0 (K/uL)    Monocytes Relative 11  3 - 12 (%)  Monocytes Absolute 1.6 (*) 0.1 - 1.0 (K/uL)    Eosinophils Relative 7 (*) 0 - 5 (%)    Eosinophils Absolute 1.0 (*) 0.0 - 0.7 (K/uL)    Basophils Relative 1  0 - 1 (%)    Basophils Absolute 0.1  0.0 - 0.1 (K/uL)   BASIC METABOLIC PANEL     Status: Abnormal   Collection Time   06/10/11  5:00 AM      Component Value Range Comment   Sodium 136  135 - 145 (mEq/L)    Potassium 3.3 (*) 3.5 - 5.1 (mEq/L)    Chloride 104  96 - 112 (mEq/L)    CO2 23  19 - 32 (mEq/L)    Glucose, Bld 91  70 - 99 (mg/dL)    BUN 47 (*) 6 - 23 (mg/dL)    Creatinine, Ser 2.28 (*) 0.50 - 1.10 (mg/dL)    Calcium 8.9  8.4 - 10.5 (mg/dL)    GFR calc non Af Amer 22 (*) >90 (mL/min)    GFR calc Af Amer 25 (*) >90 (mL/min)   MAGNESIUM     Status: Normal   Collection Time   06/10/11  5:00 AM      Component Value Range Comment   Magnesium 1.6  1.5 - 2.5 (mg/dL)   BASIC METABOLIC PANEL     Status: Abnormal   Collection Time   06/11/11  6:09 AM      Component Value Range Comment   Sodium 140  135 - 145 (mEq/L)    Potassium 3.6  3.5 - 5.1 (mEq/L)    Chloride 109  96 - 112 (mEq/L)    CO2 23  19 - 32 (mEq/L)    Glucose, Bld 86  70 - 99 (mg/dL)    BUN 45 (*) 6 - 23 (mg/dL)    Creatinine, Ser 2.21 (*) 0.50 - 1.10 (mg/dL)    Calcium 8.6  8.4 - 10.5 (mg/dL)    GFR calc non Af Amer 23 (*) >90 (mL/min)    GFR calc Af Amer 26 (*) >90 (mL/min)   CBC     Status: Abnormal   Collection Time   06/11/11  6:09 AM      Component Value Range Comment   WBC 11.5 (*) 4.0 - 10.5 (K/uL)    RBC 3.14 (*) 3.87 - 5.11 (MIL/uL)    Hemoglobin 8.4 (*) 12.0 - 15.0 (g/dL)    HCT 26.9 (*) 36.0 - 46.0 (%)    MCV 85.7  78.0 - 100.0 (fL)    MCH 26.8  26.0 - 34.0 (pg)    MCHC 31.2  30.0 - 36.0 (g/dL)    RDW 19.6 (*) 11.5 - 15.5 (%)    Platelets 395  150 - 400 (K/uL)   DIFFERENTIAL     Status: Abnormal   Collection Time   06/11/11  6:09 AM      Component  Value Range Comment   Neutrophils Relative 55  43 - 77 (%)    Neutro Abs 6.4  1.7 - 7.7 (K/uL)    Lymphocytes Relative 25  12 - 46 (%)    Lymphs Abs 2.9  0.7 - 4.0 (K/uL)    Monocytes Relative 13 (*) 3 - 12 (%)    Monocytes Absolute 1.5 (*) 0.1 - 1.0 (K/uL)    Eosinophils Relative 6 (*) 0 - 5 (%)    Eosinophils Absolute 0.6  0.0 - 0.7 (K/uL)    Basophils Relative 1  0 - 1 (%)  Basophils Absolute 0.1  0.0 - 0.1 (K/uL)     Dg Chest Port 1 View  06/06/2011  *RADIOLOGY REPORT*  Clinical Data: Bedside PICC placement.  PORTABLE CHEST - 1 VIEW 06/06/2011 2218 hours:  Comparison: Two-view chest x-ray 02/19/2011 [facility] and 01/07/2009 [facility]  Findings: Right arm PICC tip in the mid SVC.  Patient rotated to the left.  Cardiac silhouette enlarged but stable.  Pulmonary venous hypertension without overt edema.  Focal airspace opacity in the medial left lung base.  Lungs otherwise clear.  No visible pleural effusions.  IMPRESSION:  1.  Right arm PICC tip in the mid SVC. 2.  Cardiomegaly.  Pulmonary venous hypertension without overt edema.  Atelectasis versus pneumonia at the left lung base.  Original Report Authenticated By: Deniece Portela, M.D.   Dg Hip Portable 1 View Right  06/07/2011  *RADIOLOGY REPORT*  Clinical Data: Postop femur fracture  PORTABLE RIGHT HIP - 1 VIEW  Comparison: None.  Findings: Stable right total hip arthroplasty with anatomic alignment of the colon.  The joint prosthetic components.  Plate and screws with cerclage wires remain in place in the femur transfixing a midshaft femur fracture.  The fracture is obscured.  IMPRESSION: ORIF femur fracture and total hip arthroplasty, with stable hardware.  Original Report Authenticated By: Jamas Lav, M.D.   Dg Femur Right Port  06/07/2011  *RADIOLOGY REPORT*  Clinical Data: Postop  PORTABLE RIGHT FEMUR - 2 VIEW  Comparison: 04/16/2011  Findings: Right total hip arthroplasty remains anatomic.  Plate and  screws with cerclage wires transfixing a proximal diaphyseal femur fracture remain in place.  No breakage or loosening of the hardware.  Anatomic alignment of the femur.  There is been some interval healing at the fracture site. Osteopenia.  Severe osteoarthritic change of the knee.  IMPRESSION: Interval healing of the proximal femur fracture.  Hardware is stable.  Original Report Authenticated By: Jamas Lav, M.D.     Disposition: Skilled nursing facility  Diet: Heart healthy diet  Activity: Weight bearing as tolerated on right lower extremity.   Follow-up Appts: Patient is to followup with Dr. Jimmy Footman in 1 weeks Patient is to followup with Dr. Alvan Dame on 06/22/2011 Patient is to followup with Dr. Collene Mares in 2 weeks.   TESTS THAT NEED FOLLOW-UP BMET and CBC in one week   Time spent on discharge, talking to the patient, and coordinating care: Greater than 60  mins.   SignedIrine Seal 06/11/2011, 2:19 PM

## 2011-06-11 NOTE — Progress Notes (Signed)
CSW spoke with pt and pts nurse concerning discharge. Pt is returning to [name] and discharge summary was sent to [facility] at the facility and CSW confirmed they were recieved. Pt will be transported by [facility] and CSW has arranged for them to pick her up at 16:00. CSW has confirmed that this time is okay with pts nurse Janett Billow. CSW has compiled documents to be transported with pt. Pt will be going to [facility], CSW provided this info to [place] at [place]. CSW signing off.  Zollie Scale 06/11/2011 3:33 PM [id number]

## 2011-06-11 NOTE — Progress Notes (Signed)
Patient ID: [name], female   DOB: 28-Apr-1946, 65 y.o.   MRN: [id number] Subjective: Doing well. Feels like she is getting better.  No major complaints    Patient reports pain as mild.  Objective:   VITALS:   Filed Vitals:   06/11/11 0429  BP: 102/63  Pulse: 68  Temp: 98.1 F (36.7 C)  Resp: 20    Neurovascular intact Right thigh wound with wound vac, stable  LABS  Basename 06/11/11 0609 06/10/11 0500 06/09/11 0530  HGB 8.4* 8.7* 8.4*  HCT 26.9* 27.8* 26.6*  WBC 11.5* 14.8* 19.0*  PLT 395 375 342     Basename 06/11/11 0609 06/10/11 0500 06/09/11 0530  NA 140 136 138  K 3.6 3.3* 3.3*  BUN 45* 47* 45*  CREATININE 2.21* 2.28* 2.07*  GLUCOSE 86 91 76    No results found for this basename: LABPT:2,INR:2 in the last 72 hours   Assessment/Plan:  After reviewing xrays she will be increased to WBAT RLE  Should have wound changed today prior to return to faciltiy.   She has follow up appointment with me already scheduled, will she her dec.7  Discharge to SNF

## 2011-06-14 LAB — CULTURE, BLOOD (ROUTINE X 2)
Culture  Setup Time: 201211231745
Culture  Setup Time: 201211231745
Culture: NO GROWTH
Culture: NO GROWTH

## 2011-07-17 LAB — HM DEXA SCAN

## 2011-08-24 ENCOUNTER — Encounter (HOSPITAL_COMMUNITY): Payer: Self-pay | Admitting: Pharmacy Technician

## 2011-08-28 ENCOUNTER — Telehealth: Payer: Self-pay | Admitting: Family Medicine

## 2011-08-28 ENCOUNTER — Encounter (HOSPITAL_COMMUNITY): Payer: Self-pay

## 2011-08-28 ENCOUNTER — Encounter (HOSPITAL_COMMUNITY)
Admission: RE | Admit: 2011-08-28 | Discharge: 2011-08-28 | Disposition: A | Discharge status: Home / Self Care | Payer: Medicare Other | Source: Ambulatory Visit | Attending: Orthopedic Surgery | Admitting: Orthopedic Surgery

## 2011-08-28 HISTORY — DX: Reserved for inherently not codable concepts without codable children: IMO0001

## 2011-08-28 HISTORY — DX: Unspecified osteoarthritis, unspecified site: M19.90

## 2011-08-28 HISTORY — DX: Encounter for other specified aftercare: Z51.89

## 2011-08-28 HISTORY — DX: Anemia, unspecified: D64.9

## 2011-08-28 LAB — BASIC METABOLIC PANEL
BUN: 30 mg/dL — ABNORMAL HIGH (ref 6–23)
CO2: 29 mEq/L (ref 19–32)
Calcium: 10 mg/dL (ref 8.4–10.5)
Chloride: 101 mEq/L (ref 96–112)
Creatinine, Ser: 2.01 mg/dL — ABNORMAL HIGH (ref 0.50–1.10)
GFR calc Af Amer: 29 mL/min — ABNORMAL LOW (ref >90)
GFR calc non Af Amer: 25 mL/min — ABNORMAL LOW (ref >90)
Glucose, Bld: 106 mg/dL — ABNORMAL HIGH (ref 70–99)
Potassium: 4 mEq/L (ref 3.5–5.1)
Sodium: 141 mEq/L (ref 135–145)

## 2011-08-28 LAB — DIFFERENTIAL
Basophils Absolute: 0.2 10*3/uL — ABNORMAL HIGH (ref 0.0–0.1)
Basophils Relative: 1 % (ref 0–1)
Eosinophils Absolute: 0.2 10*3/uL (ref 0.0–0.7)
Eosinophils Relative: 1 % (ref 0–5)
Lymphocytes Relative: 21 % (ref 12–46)
Lymphs Abs: 3.2 10*3/uL (ref 0.7–4.0)
Monocytes Absolute: 1.1 10*3/uL — ABNORMAL HIGH (ref 0.1–1.0)
Monocytes Relative: 7 % (ref 3–12)
Neutro Abs: 10.6 10*3/uL — ABNORMAL HIGH (ref 1.7–7.7)
Neutrophils Relative %: 70 % (ref 43–77)

## 2011-08-28 LAB — URINALYSIS, ROUTINE W REFLEX MICROSCOPIC
Bilirubin Urine: NEGATIVE
Glucose, UA: NEGATIVE mg/dL
Hgb urine dipstick: NEGATIVE
Ketones, ur: NEGATIVE mg/dL
Leukocytes, UA: NEGATIVE
Nitrite: NEGATIVE
Protein, ur: 30 mg/dL — AB
Specific Gravity, Urine: 1.013 (ref 1.005–1.030)
Urobilinogen, UA: 0.2 mg/dL (ref 0.0–1.0)
pH: 7 (ref 5.0–8.0)

## 2011-08-28 LAB — CBC
HCT: 30.9 % — ABNORMAL LOW (ref 36.0–46.0)
Hemoglobin: 9.6 g/dL — ABNORMAL LOW (ref 12.0–15.0)
MCH: 25.6 pg — ABNORMAL LOW (ref 26.0–34.0)
MCHC: 31.1 g/dL (ref 30.0–36.0)
MCV: 82.4 fL (ref 78.0–100.0)
Platelets: 624 10*3/uL — ABNORMAL HIGH (ref 150–400)
RBC: 3.75 MIL/uL — ABNORMAL LOW (ref 3.87–5.11)
RDW: 18 % — ABNORMAL HIGH (ref 11.5–15.5)
WBC: 15.3 10*3/uL — ABNORMAL HIGH (ref 4.0–10.5)

## 2011-08-28 LAB — URINE MICROSCOPIC-ADD ON

## 2011-08-28 LAB — APTT: aPTT: 39 seconds — ABNORMAL HIGH (ref 24–37)

## 2011-08-28 LAB — PROTIME-INR
INR: 0.97 (ref 0.00–1.49)
Prothrombin Time: 13.1 seconds (ref 11.6–15.2)

## 2011-08-28 LAB — SURGICAL PCR SCREEN
MRSA, PCR: NEGATIVE
Staphylococcus aureus: POSITIVE — AB

## 2011-08-28 MED ORDER — SERTRALINE HCL 50 MG PO TABS: 50.0000 mg | ORAL_TABLET | Freq: Every day | ORAL | Status: DC

## 2011-08-28 NOTE — Telephone Encounter (Signed)
Faxed 30 day supply--patient due for an OV     KP

## 2011-08-28 NOTE — Pre-Procedure Instructions (Signed)
PT HAS HAD NUMEROUS CXR'S  W/IN PAST 12 MONTHS -REPORTS ARE IN EPIC--PT'S BREATHING GOOD TODAY--DID NOT REPEAT CXR TODAY. PT'S MOST RECENT EKG  AND CARDIOLOGY OFFICE NOTES 08/16/11 FROM DR. TRACI TUNER ARE ON THIS CHART.

## 2011-08-28 NOTE — Patient Instructions (Signed)
[place]  08/28/2011   Your procedure is scheduled on:  Monday 2/18  AT 7:30 AM  Report to [facility] at 5:30 AM.  Call this number if you have problems the morning of surgery: [phone]   Remember:BRING YOUR CPAP MASK   Do not eat food OR DRINK ANYTHING AFTER MIDNIGHT THE NIGHT BEFORE YOUR SURGERY    Take these medicines the morning of surgery with A SIP OF WATER: ALLOPURINOL, DILTIAZEM, NEURONTIN, HYDROCODONE/ACETAMINOPHEN FOR PAIN IF NEEDED, METOPROLOL, SOTALOL   Do not wear jewelry, make-up or nail polish.  Do not wear lotions, powders, or perfumes.   Do not shave 48 hours prior to surgery.  Do not bring valuables to the hospital.  Contacts, dentures or bridgework may not be worn into surgery.  Leave suitcase in the car. After surgery it may be brought to your room.  For patients admitted to the hospital, checkout time is 11:00 AM the day of discharge.   Patients discharged the day of surgery will not be allowed to drive home.    Special Instructions: CHG Shower Use Special Wash: 1/2 bottle night before surgery and 1/2 bottle morning of surgery.   Please read over the following fact sheets that you were given: Blood Transfusion Information and MRSA Information AND INCENTIVE SPIROMETER INFORMATION

## 2011-08-28 NOTE — Telephone Encounter (Signed)
Sertraline hcl 50mg  tablet   Patient is requesting a 90 day supply.

## 2011-08-29 NOTE — Pre-Procedure Instructions (Signed)
MESSAGE LEFT YESTERDAY FOR [name] AT DR. Aurea Graff OFFICE TO CALL ME-REGARDING ABNORMAL LABS OF [name] - BUT DID NOT HEAR BACK.  CALLED AGAIN TODAY--DID SPEAK WITH [name] TO LET HER KNOW PT'S HGB 9.6, AND BUN AND CREATININE ELEVATED--TO PLEASE LET DR. OLIN KNOW TO CHECK HER LAB RESULTS IN COMPUTER.  [name] WILL GIVE HIM THE MESSAGE.

## 2011-08-29 NOTE — Progress Notes (Signed)
H&P performed 08/29/11 Dictation # [id number]

## 2011-08-30 NOTE — H&P (Signed)
[name], [name]                 ACCOUNT NO.:  [id number]  MEDICAL RECORD NO.:  [id number]  LOCATION:                               FACILITY:  [facility]  PHYSICIAN:  Pietro Cassis. Alvan Dame, M.D.  DATE OF BIRTH:  22-Aug-1945  DATE OF ADMISSION:  09/03/2011 DATE OF DISCHARGE:                             HISTORY & PHYSICAL   DATE OF SURGERY:  September 03, 2011.  ADMITTING DIAGNOSIS:  Status post total hip arthroplasty with fracture and infection and wound breakdown.  SCHEDULED SURGERY:  Revision, total hip; I and D, postoperative wound infection, probable insertion of antibiotic spacer and subsequent reimplantation when fracture and wound is healed; possible revision total hip arthroplasty.  HISTORY OF PRESENT ILLNESS:  This is a 66 year old lady status post total hip arthroplasty with subsequent fall and periprosthetic fracture, with subsequent wound breakdown and infection.  She has been utilizing a wound VAC for wound closure, but still has elevated sed rate and CRP. At this time, we plan to go in, check surrounding tissues.  If there is any evidence of infection at this point, then subsequent removal of all hardware and prosthesis with placement of antibiotic beads and spacer, and then subsequent reimplantation once evidence of infection is gone. The surgery, risks, benefits, and aftercare were discussed in detail with the patient and her husband; questions invited and answered.  Note that she is planning to go home after surgery.  She will be on Xarelto 10 mg 1 daily for 2 weeks postoperatively and then aspirin 325 mg 1 b.i.d. for another 4 weeks.  PAST MEDICAL HISTORY:  Drug allergies to WARFARIN with a history of stroke.  Serious medical illnesses include hypertension, history of atrial fibrillation, gout, neuropathy, history of congestive heart failure, sleep apnea, kidney disease, and arthritis.  PREVIOUS SURGERIES:  Include open heart surgery for septal  defect, cholecystectomy, and exploratory laparotomy.  CURRENT MEDICATIONS: 1. Lasix 80 mg 2 q.a.m. and 1 q.p.m. 2. Metoprolol at 80 mg 1 daily. 3. Hectorol 0.5 mg 1 daily. 4. Diltiazem 300 mg 1 daily. 5. Allopurinol 300 mg daily. 6. Colcrys 0.6 mg 1 daily. 7. Aspirin 325 mg b.i.d. 8. Potassium 20 mEq daily. 9. Sertraline 50 mg daily. 10.Neurontin 300 mg t.i.d. 11.Vicodin p.r.n. 12.Iron 325 t.i.d. 13.Robaxin 500 t.i.d.  FAMILY HISTORY:  Positive for hypertension and heart disease.  SOCIAL HISTORY:  The patient is married.  She does not smoke.  She does not drink.  She plans on going home after surgery.  REVIEW OF SYSTEMS:  CENTRAL NERVOUS SYSTEM:  Positive for depression and history of stroke:  PULMONARY:  Positive for asthma, sleep apnea, and exertional shortness of breath.  She does use a CPAP machine. CARDIOVASCULAR:  Positive for history of congestive heart failure and atrial fibrillation, history of surgery for septal defect and hypertension.  GI:  Negative for ulcers or hepatitis.  GU:  Positive for history of kidney disease and urinary incontinence.  MUSCULOSKELETAL: Positive for gout.  PHYSICAL EXAMINATION:  VITAL SIGNS:  Today, blood pressure 167/89, pulse 76 and regular, respirations 16. HEENT.  Head normocephalic.  Nose patent.  Ears patent.  Pupils equal, round, and reactive to light.  Throat without injection. NECK:  Supple without adenopathy.  Carotids 2+ without bruit. CHEST:  Clear auscultation.  No rales or rhonchi.  Respirations 16. HEART:  Regular rate and rhythm at 76 beats per minute without murmur. ABDOMEN:  Soft with active bowel sounds.  No masses or organomegaly. NEUROLOGIC:  The patient alert and oriented to time, place, and person. Cranial nerves II-XII grossly intact. EXTREMITIES:  Show a VAC on the right hip with wound slowly healing in. Neurovascular status is intact.  She has periprosthetic fracture with probable infection.  IMPRESSION:   Status post right total hip arthroplasty with subsequent periprosthetic fracture and postoperative wound infection.  PLAN:  Go in, I and D, probable removal of all hardware, and placement of antibiotic spacer and beads with subsequent reimplantation when skin wound is healed and infection is healed versus total hip revision.     [name]. [name], P.A.   ______________________________ Pietro Cassis Alvan Dame, M.D.    SJC/MEDQ  D:  08/29/2011  T:  08/30/2011  Job:  [id number]

## 2011-08-31 MED ORDER — CEFAZOLIN SODIUM 1-5 GM-% IV SOLN: 1.0000 g | INTRAVENOUS | Status: DC

## 2011-08-31 MED ORDER — CHLORHEXIDINE GLUCONATE 4 % EX LIQD: 60.0000 mL | Freq: Once | CUTANEOUS | Status: DC

## 2011-08-31 NOTE — Progress Notes (Signed)
Pt was seen in presurgical testing 08/28/11 where consent was signed and labs drawn. Gerrit Halls PA reordered consent to be signed and lab work. Writer released and discontinued duplicate orders done [id number].

## 2011-09-03 ENCOUNTER — Encounter (HOSPITAL_COMMUNITY): Payer: Self-pay | Admitting: Anesthesiology

## 2011-09-03 ENCOUNTER — Encounter (HOSPITAL_COMMUNITY)
Admission: RE | Disposition: A | Discharge status: Skilled Nursing Facility | Payer: Self-pay | Source: Ambulatory Visit | Attending: Orthopedic Surgery

## 2011-09-03 ENCOUNTER — Encounter (HOSPITAL_COMMUNITY): Payer: Self-pay | Admitting: *Deleted

## 2011-09-03 ENCOUNTER — Inpatient Hospital Stay (HOSPITAL_COMMUNITY): Payer: Medicare Other | Admitting: Anesthesiology

## 2011-09-03 ENCOUNTER — Inpatient Hospital Stay (HOSPITAL_COMMUNITY)
Admission: RE | Admit: 2011-09-03 | Discharge: 2011-09-06 | DRG: 464 | Disposition: A | Discharge status: Skilled Nursing Facility | Payer: Medicare Other | Source: Ambulatory Visit | Attending: Orthopedic Surgery | Admitting: Orthopedic Surgery

## 2011-09-03 DIAGNOSIS — Z89629 Acquired absence of unspecified hip joint: Secondary | ICD-10-CM

## 2011-09-03 DIAGNOSIS — Y831 Surgical operation with implant of artificial internal device as the cause of abnormal reaction of the patient, or of later complication, without mention of misadventure at the time of the procedure: Secondary | ICD-10-CM | POA: Diagnosis present

## 2011-09-03 DIAGNOSIS — I4891 Unspecified atrial fibrillation: Secondary | ICD-10-CM | POA: Diagnosis present

## 2011-09-03 DIAGNOSIS — Z9660 Presence of unspecified orthopedic joint implant: Principal | ICD-10-CM | POA: Diagnosis present

## 2011-09-03 DIAGNOSIS — G589 Mononeuropathy, unspecified: Secondary | ICD-10-CM | POA: Diagnosis present

## 2011-09-03 DIAGNOSIS — G4730 Sleep apnea, unspecified: Secondary | ICD-10-CM | POA: Diagnosis present

## 2011-09-03 DIAGNOSIS — T8450XA Infection and inflammatory reaction due to unspecified internal joint prosthesis, initial encounter: Secondary | ICD-10-CM | POA: Diagnosis present

## 2011-09-03 DIAGNOSIS — T84049A Periprosthetic fracture around unspecified internal prosthetic joint, initial encounter: Principal | ICD-10-CM | POA: Diagnosis present

## 2011-09-03 DIAGNOSIS — M109 Gout, unspecified: Secondary | ICD-10-CM | POA: Diagnosis present

## 2011-09-03 DIAGNOSIS — Z96649 Presence of unspecified artificial hip joint: Secondary | ICD-10-CM

## 2011-09-03 DIAGNOSIS — B965 Pseudomonas (aeruginosa) (mallei) (pseudomallei) as the cause of diseases classified elsewhere: Secondary | ICD-10-CM | POA: Diagnosis present

## 2011-09-03 DIAGNOSIS — Z6841 Body Mass Index (BMI) 40.0 and over, adult: Secondary | ICD-10-CM

## 2011-09-03 DIAGNOSIS — I129 Hypertensive chronic kidney disease with stage 1 through stage 4 chronic kidney disease, or unspecified chronic kidney disease: Secondary | ICD-10-CM | POA: Diagnosis present

## 2011-09-03 DIAGNOSIS — N189 Chronic kidney disease, unspecified: Secondary | ICD-10-CM | POA: Diagnosis present

## 2011-09-03 DIAGNOSIS — I509 Heart failure, unspecified: Secondary | ICD-10-CM | POA: Diagnosis present

## 2011-09-03 DIAGNOSIS — D62 Acute posthemorrhagic anemia: Secondary | ICD-10-CM

## 2011-09-03 LAB — BASIC METABOLIC PANEL
BUN: 33 mg/dL — ABNORMAL HIGH (ref 6–23)
CO2: 26 mEq/L (ref 19–32)
Calcium: 8.4 mg/dL (ref 8.4–10.5)
Chloride: 101 mEq/L (ref 96–112)
Creatinine, Ser: 1.88 mg/dL — ABNORMAL HIGH (ref 0.50–1.10)
GFR calc Af Amer: 32 mL/min — ABNORMAL LOW (ref >90)
GFR calc non Af Amer: 27 mL/min — ABNORMAL LOW (ref >90)
Glucose, Bld: 127 mg/dL — ABNORMAL HIGH (ref 70–99)
Potassium: 3.9 mEq/L (ref 3.5–5.1)
Sodium: 135 mEq/L (ref 135–145)

## 2011-09-03 LAB — CBC
HCT: 31 % — ABNORMAL LOW (ref 36.0–46.0)
Hemoglobin: 10.3 g/dL — ABNORMAL LOW (ref 12.0–15.0)
MCH: 27.5 pg (ref 26.0–34.0)
MCHC: 33.2 g/dL (ref 30.0–36.0)
MCV: 82.7 fL (ref 78.0–100.0)
Platelets: 361 10*3/uL (ref 150–400)
RBC: 3.75 MIL/uL — ABNORMAL LOW (ref 3.87–5.11)
RDW: 15.5 % (ref 11.5–15.5)
WBC: 18.4 10*3/uL — ABNORMAL HIGH (ref 4.0–10.5)

## 2011-09-03 LAB — GRAM STAIN

## 2011-09-03 SURGERY — EXCISIONAL TOTAL HIP ARTHROPLASTY WITH ANTIBIOTIC SPACERS
Anesthesia: General | Site: Hip | Laterality: Right | Wound class: Dirty or Infected

## 2011-09-03 MED ORDER — METHOCARBAMOL 500 MG PO TABS
500.0000 mg | ORAL_TABLET | Freq: Four times a day (QID) | ORAL | Status: DC | PRN
  Administered 2011-09-04 – 2011-09-05 (×5): 500 mg via ORAL
  Filled 2011-09-03 (×5): qty 1

## 2011-09-03 MED ORDER — FERROUS SULFATE 325 (65 FE) MG PO TABS
325.0000 mg | ORAL_TABLET | Freq: Three times a day (TID) | ORAL | Status: DC
  Administered 2011-09-03 – 2011-09-06 (×9): 325 mg via ORAL
  Filled 2011-09-03 (×10): qty 1

## 2011-09-03 MED ORDER — BISACODYL 5 MG PO TBEC
5.0000 mg | DELAYED_RELEASE_TABLET | Freq: Every day | ORAL | Status: DC | PRN
  Administered 2011-09-05: 06:00:00 via ORAL
  Filled 2011-09-03 (×3): qty 1

## 2011-09-03 MED ORDER — ONDANSETRON HCL 4 MG/2ML IJ SOLN
4.0000 mg | Freq: Four times a day (QID) | INTRAMUSCULAR | Status: DC | PRN
  Administered 2011-09-03: 4 mg via INTRAVENOUS
  Filled 2011-09-03: qty 2

## 2011-09-03 MED ORDER — LACTATED RINGERS IV BOLUS (SEPSIS)
500.0000 mL | Freq: Once | INTRAVENOUS | Status: AC
  Administered 2011-09-03: 500 mL via INTRAVENOUS

## 2011-09-03 MED ORDER — GABAPENTIN 300 MG PO CAPS
300.0000 mg | ORAL_CAPSULE | Freq: Three times a day (TID) | ORAL | Status: DC
  Administered 2011-09-03 – 2011-09-06 (×9): 300 mg via ORAL
  Filled 2011-09-03 (×11): qty 1

## 2011-09-03 MED ORDER — ACETAMINOPHEN 650 MG RE SUPP: 650.0000 mg | Freq: Four times a day (QID) | RECTAL | Status: DC | PRN

## 2011-09-03 MED ORDER — FUROSEMIDE 80 MG PO TABS
160.0000 mg | ORAL_TABLET | Freq: Every day | ORAL | Status: DC
  Administered 2011-09-04: 160 mg via ORAL
  Filled 2011-09-03: qty 2

## 2011-09-03 MED ORDER — HYDROCODONE-ACETAMINOPHEN 7.5-325 MG PO TABS
1.0000 | ORAL_TABLET | ORAL | Status: DC
  Administered 2011-09-03: 2 via ORAL
  Administered 2011-09-03: 1 via ORAL
  Administered 2011-09-04 – 2011-09-05 (×7): 2 via ORAL
  Administered 2011-09-05: 1 via ORAL
  Administered 2011-09-05 (×2): 2 via ORAL
  Administered 2011-09-05 – 2011-09-06 (×3): 1 via ORAL
  Administered 2011-09-06 (×2): 2 via ORAL
  Filled 2011-09-03 (×8): qty 2
  Filled 2011-09-03 (×2): qty 1
  Filled 2011-09-03: qty 2
  Filled 2011-09-03 (×4): qty 1
  Filled 2011-09-03: qty 2
  Filled 2011-09-03: qty 1
  Filled 2011-09-03: qty 2

## 2011-09-03 MED ORDER — ACETAMINOPHEN 325 MG PO TABS: 650.0000 mg | ORAL_TABLET | Freq: Four times a day (QID) | ORAL | Status: DC | PRN

## 2011-09-03 MED ORDER — LIDOCAINE HCL (CARDIAC) 20 MG/ML IV SOLN
INTRAVENOUS | Status: DC | PRN
  Administered 2011-09-03: 100 mg via INTRAVENOUS

## 2011-09-03 MED ORDER — VANCOMYCIN HCL 1000 MG IV SOLR
1000.0000 mg | INTRAVENOUS | Status: DC | PRN
  Administered 2011-09-03: 1000 mg via INTRAVENOUS

## 2011-09-03 MED ORDER — ALLOPURINOL 300 MG PO TABS
300.0000 mg | ORAL_TABLET | Freq: Every day | ORAL | Status: DC
  Filled 2011-09-03: qty 1

## 2011-09-03 MED ORDER — DOXERCALCIFEROL 0.5 MCG PO CAPS
0.5000 ug | ORAL_CAPSULE | Freq: Every day | ORAL | Status: DC
  Administered 2011-09-04 – 2011-09-06 (×3): 0.5 ug via ORAL
  Filled 2011-09-03 (×3): qty 1

## 2011-09-03 MED ORDER — FENTANYL CITRATE 0.05 MG/ML IJ SOLN
INTRAMUSCULAR | Status: DC | PRN
  Administered 2011-09-03 (×3): 50 ug via INTRAVENOUS

## 2011-09-03 MED ORDER — ZOLPIDEM TARTRATE 5 MG PO TABS: 5.0000 mg | ORAL_TABLET | Freq: Every evening | ORAL | Status: DC | PRN

## 2011-09-03 MED ORDER — POTASSIUM CHLORIDE CRYS ER 20 MEQ PO TBCR
20.0000 meq | EXTENDED_RELEASE_TABLET | Freq: Every day | ORAL | Status: DC
  Administered 2011-09-03 – 2011-09-06 (×4): 20 meq via ORAL
  Filled 2011-09-03 (×4): qty 1

## 2011-09-03 MED ORDER — FLEET ENEMA 7-19 GM/118ML RE ENEM: 1.0000 | ENEMA | Freq: Once | RECTAL | Status: AC | PRN

## 2011-09-03 MED ORDER — GLYCOPYRROLATE 0.2 MG/ML IJ SOLN
INTRAMUSCULAR | Status: DC | PRN
  Administered 2011-09-03: .8 mg via INTRAVENOUS

## 2011-09-03 MED ORDER — POLYETHYLENE GLYCOL 3350 17 G PO PACK
17.0000 g | PACK | Freq: Two times a day (BID) | ORAL | Status: DC
  Administered 2011-09-03 – 2011-09-05 (×5): 17 g via ORAL
  Filled 2011-09-03 (×7): qty 1

## 2011-09-03 MED ORDER — VANCOMYCIN HCL 1000 MG IV SOLR
2000.0000 mg | Freq: Once | INTRAVENOUS | Status: AC
  Administered 2011-09-03: 2000 mg via INTRAVENOUS
  Filled 2011-09-03: qty 2000

## 2011-09-03 MED ORDER — ACETAMINOPHEN 10 MG/ML IV SOLN
INTRAVENOUS | Status: DC | PRN
  Administered 2011-09-03: 1000 mg via INTRAVENOUS

## 2011-09-03 MED ORDER — TOBRAMYCIN SULFATE 1.2 G IJ SOLR
2.4000 g | INTRAMUSCULAR | Status: DC
  Filled 2011-09-03: qty 2.4

## 2011-09-03 MED ORDER — MENTHOL 3 MG MT LOZG: 1.0000 | LOZENGE | OROMUCOSAL | Status: DC | PRN

## 2011-09-03 MED ORDER — FUROSEMIDE 10 MG/ML IJ SOLN
20.0000 mg | Freq: Once | INTRAMUSCULAR | Status: AC
  Administered 2011-09-03: 20 mg via INTRAVENOUS
  Filled 2011-09-03 (×2): qty 2

## 2011-09-03 MED ORDER — PHENYLEPHRINE HCL 10 MG/ML IJ SOLN
INTRAMUSCULAR | Status: DC | PRN
  Administered 2011-09-03: 160 ug via INTRAVENOUS
  Administered 2011-09-03: 80 ug via INTRAVENOUS
  Administered 2011-09-03: 160 ug via INTRAVENOUS
  Administered 2011-09-03: 80 ug via INTRAVENOUS
  Administered 2011-09-03: 160 ug via INTRAVENOUS

## 2011-09-03 MED ORDER — LACTATED RINGERS IV SOLN
INTRAVENOUS | Status: DC | PRN
  Administered 2011-09-03: 07:00:00 via INTRAVENOUS

## 2011-09-03 MED ORDER — HETASTARCH-ELECTROLYTES 6 % IV SOLN
500.0000 mL | Freq: Once | INTRAVENOUS | Status: AC
  Administered 2011-09-03: 500 mL via INTRAVENOUS

## 2011-09-03 MED ORDER — ONDANSETRON HCL 4 MG/2ML IJ SOLN
INTRAMUSCULAR | Status: DC | PRN
  Administered 2011-09-03: 4 mg via INTRAVENOUS

## 2011-09-03 MED ORDER — SERTRALINE HCL 50 MG PO TABS
50.0000 mg | ORAL_TABLET | Freq: Every day | ORAL | Status: DC
  Administered 2011-09-04 – 2011-09-06 (×3): 50 mg via ORAL
  Filled 2011-09-03 (×3): qty 1

## 2011-09-03 MED ORDER — METHOCARBAMOL 100 MG/ML IJ SOLN
500.0000 mg | Freq: Four times a day (QID) | INTRAVENOUS | Status: DC | PRN
  Administered 2011-09-03: 500 mg via INTRAVENOUS
  Filled 2011-09-03 (×3): qty 5

## 2011-09-03 MED ORDER — EPHEDRINE SULFATE 50 MG/ML IJ SOLN
INTRAMUSCULAR | Status: DC | PRN
  Administered 2011-09-03 (×4): 5 mg via INTRAVENOUS
  Administered 2011-09-03: 10 mg via INTRAVENOUS
  Administered 2011-09-03: 5 mg via INTRAVENOUS

## 2011-09-03 MED ORDER — LACTATED RINGERS IV SOLN: INTRAVENOUS | Status: DC

## 2011-09-03 MED ORDER — METOCLOPRAMIDE HCL 5 MG/ML IJ SOLN: 5.0000 mg | Freq: Three times a day (TID) | INTRAMUSCULAR | Status: DC | PRN

## 2011-09-03 MED ORDER — METOPROLOL SUCCINATE ER 100 MG PO TB24
100.0000 mg | ORAL_TABLET | Freq: Every day | ORAL | Status: DC
  Administered 2011-09-06: 100 mg via ORAL
  Filled 2011-09-03 (×3): qty 1

## 2011-09-03 MED ORDER — METOCLOPRAMIDE HCL 10 MG PO TABS
5.0000 mg | ORAL_TABLET | Freq: Three times a day (TID) | ORAL | Status: DC | PRN
  Filled 2011-09-03: qty 1

## 2011-09-03 MED ORDER — 0.9 % SODIUM CHLORIDE (POUR BTL) OPTIME
TOPICAL | Status: DC | PRN
  Administered 2011-09-03: 1000 mL

## 2011-09-03 MED ORDER — ONDANSETRON HCL 4 MG PO TABS
4.0000 mg | ORAL_TABLET | Freq: Four times a day (QID) | ORAL | Status: DC | PRN
  Filled 2011-09-03: qty 1

## 2011-09-03 MED ORDER — VANCOMYCIN HCL 1000 MG IV SOLR: 1750.0000 mg | INTRAVENOUS | Status: DC

## 2011-09-03 MED ORDER — ALLOPURINOL 300 MG PO TABS
300.0000 mg | ORAL_TABLET | Freq: Every day | ORAL | Status: DC
  Administered 2011-09-04 – 2011-09-06 (×3): 300 mg via ORAL
  Filled 2011-09-03 (×3): qty 1

## 2011-09-03 MED ORDER — PROMETHAZINE HCL 25 MG/ML IJ SOLN: 6.2500 mg | INTRAMUSCULAR | Status: DC | PRN

## 2011-09-03 MED ORDER — VANCOMYCIN HCL POWD
Status: DC | PRN
  Administered 2011-09-03: 2 mg

## 2011-09-03 MED ORDER — SUCCINYLCHOLINE CHLORIDE 20 MG/ML IJ SOLN
INTRAMUSCULAR | Status: DC | PRN
  Administered 2011-09-03: 100 mg via INTRAVENOUS

## 2011-09-03 MED ORDER — PROPOFOL 10 MG/ML IV EMUL
INTRAVENOUS | Status: DC | PRN
  Administered 2011-09-03: 250 mg via INTRAVENOUS

## 2011-09-03 MED ORDER — SODIUM CHLORIDE 0.9 % IV SOLN
100.0000 mL/h | INTRAVENOUS | Status: DC
  Administered 2011-09-03 – 2011-09-05 (×4): 100 mL/h via INTRAVENOUS
  Filled 2011-09-03 (×16): qty 1000

## 2011-09-03 MED ORDER — CHLORHEXIDINE GLUCONATE 4 % EX LIQD: 60.0000 mL | Freq: Once | CUTANEOUS | Status: DC

## 2011-09-03 MED ORDER — MIDAZOLAM HCL 5 MG/5ML IJ SOLN
INTRAMUSCULAR | Status: DC | PRN
  Administered 2011-09-03: 2 mg via INTRAVENOUS

## 2011-09-03 MED ORDER — HYDROMORPHONE HCL PF 1 MG/ML IJ SOLN
0.2500 mg | INTRAMUSCULAR | Status: DC | PRN
Start: 2011-09-03 — End: 2011-09-03
  Administered 2011-09-03 (×7): 0.25 mg via INTRAVENOUS

## 2011-09-03 MED ORDER — LACTATED RINGERS IV SOLN
INTRAVENOUS | Status: DC | PRN
  Administered 2011-09-03: 10:00:00 via INTRAVENOUS

## 2011-09-03 MED ORDER — TOBRAMYCIN SULFATE POWD
Status: DC | PRN
  Administered 2011-09-03: 2.4 mg

## 2011-09-03 MED ORDER — ALUM & MAG HYDROXIDE-SIMETH 200-200-20 MG/5ML PO SUSP: 30.0000 mL | ORAL | Status: DC | PRN

## 2011-09-03 MED ORDER — VANCOMYCIN HCL 1000 MG IV SOLR
1500.0000 mg | INTRAVENOUS | Status: DC
  Administered 2011-09-04: 1500 mg via INTRAVENOUS
  Filled 2011-09-03 (×2): qty 1500

## 2011-09-03 MED ORDER — SOTALOL HCL 80 MG PO TABS
80.0000 mg | ORAL_TABLET | Freq: Two times a day (BID) | ORAL | Status: DC
  Filled 2011-09-03 (×3): qty 1

## 2011-09-03 MED ORDER — SODIUM CHLORIDE 0.9 % IV SOLN
INTRAVENOUS | Status: DC | PRN
  Administered 2011-09-03: 09:00:00 via INTRAVENOUS

## 2011-09-03 MED ORDER — DIPHENHYDRAMINE HCL 25 MG PO CAPS
25.0000 mg | ORAL_CAPSULE | Freq: Four times a day (QID) | ORAL | Status: DC | PRN
  Filled 2011-09-03: qty 1

## 2011-09-03 MED ORDER — ROCURONIUM BROMIDE 100 MG/10ML IV SOLN
INTRAVENOUS | Status: DC | PRN
  Administered 2011-09-03: 30 mg via INTRAVENOUS
  Administered 2011-09-03: 10 mg via INTRAVENOUS

## 2011-09-03 MED ORDER — NEOSTIGMINE METHYLSULFATE 1 MG/ML IJ SOLN
INTRAMUSCULAR | Status: DC | PRN
  Administered 2011-09-03: 5 mg via INTRAVENOUS

## 2011-09-03 MED ORDER — RIVAROXABAN 10 MG PO TABS
10.0000 mg | ORAL_TABLET | ORAL | Status: DC
  Administered 2011-09-05 – 2011-09-06 (×2): 10 mg via ORAL
  Filled 2011-09-03 (×3): qty 1

## 2011-09-03 MED ORDER — DOCUSATE SODIUM 100 MG PO CAPS
100.0000 mg | ORAL_CAPSULE | Freq: Two times a day (BID) | ORAL | Status: DC
  Administered 2011-09-03 – 2011-09-06 (×6): 100 mg via ORAL
  Filled 2011-09-03 (×7): qty 1

## 2011-09-03 MED ORDER — FUROSEMIDE 80 MG PO TABS
80.0000 mg | ORAL_TABLET | Freq: Every day | ORAL | Status: DC
  Administered 2011-09-03: 80 mg via ORAL
  Filled 2011-09-03 (×2): qty 1

## 2011-09-03 MED ORDER — PHENOL 1.4 % MT LIQD: 1.0000 | OROMUCOSAL | Status: DC | PRN

## 2011-09-03 MED ORDER — PHENYLEPHRINE HCL 10 MG/ML IJ SOLN
20.0000 mg | INTRAVENOUS | Status: DC | PRN
  Administered 2011-09-03: 25 ug/min via INTRAVENOUS

## 2011-09-03 MED ORDER — FUROSEMIDE 80 MG PO TABS: 80.0000 mg | ORAL_TABLET | Freq: Two times a day (BID) | ORAL | Status: DC

## 2011-09-03 MED ORDER — COLCHICINE 0.6 MG PO TABS
0.6000 mg | ORAL_TABLET | Freq: Every day | ORAL | Status: DC
  Administered 2011-09-03 – 2011-09-06 (×4): 0.6 mg via ORAL
  Filled 2011-09-03 (×4): qty 1

## 2011-09-03 MED ORDER — SOTALOL HCL 80 MG PO TABS
80.0000 mg | ORAL_TABLET | Freq: Two times a day (BID) | ORAL | Status: DC
  Administered 2011-09-05 – 2011-09-06 (×2): 80 mg via ORAL
  Filled 2011-09-03 (×6): qty 1

## 2011-09-03 MED ORDER — SODIUM CHLORIDE 0.9 % IR SOLN
Status: DC | PRN
  Administered 2011-09-03: 3000 mL

## 2011-09-03 MED ORDER — HYDROMORPHONE HCL PF 1 MG/ML IJ SOLN
0.5000 mg | INTRAMUSCULAR | Status: DC | PRN
  Administered 2011-09-03 – 2011-09-04 (×4): 1 mg via INTRAVENOUS
  Filled 2011-09-03 (×4): qty 1

## 2011-09-03 MED ORDER — DILTIAZEM HCL ER COATED BEADS 300 MG PO CP24
300.0000 mg | ORAL_CAPSULE | Freq: Every day | ORAL | Status: DC
  Administered 2011-09-06: 300 mg via ORAL
  Filled 2011-09-03 (×3): qty 1

## 2011-09-03 MED ORDER — CEFAZOLIN SODIUM-DEXTROSE 2-3 GM-% IV SOLR
2.0000 g | Freq: Once | INTRAVENOUS | Status: AC
  Administered 2011-09-03: 2 g via INTRAVENOUS

## 2011-09-03 MED FILL — Vancomycin HCl For IV Soln 1 GM (Base Equivalent): INTRAVENOUS | Qty: 8000 | Status: AC

## 2011-09-03 MED FILL — Tobramycin Sulfate For Inj 1.2 GM: INTRAMUSCULAR | Qty: 8 | Status: AC

## 2011-09-03 SURGICAL SUPPLY — 60 items
BAG ZIPLOCK 12X15 (MISCELLANEOUS) ×2 IMPLANT
BIT DRILL 2.8X128 (BIT) ×2 IMPLANT
BLADE SAW SGTL 18X1.27X75 (BLADE) ×2 IMPLANT
BNDG COHESIVE 6X5 TAN STRL LF (GAUZE/BANDAGES/DRESSINGS) ×2 IMPLANT
BRUSH FEMORAL CANAL (MISCELLANEOUS) ×2 IMPLANT
CEMENT HV SMART SET (Cement) ×6 IMPLANT
CLOTH BEACON ORANGE TIMEOUT ST (SAFETY) ×2 IMPLANT
DRAPE INCISE IOBAN 85X60 (DRAPES) ×2 IMPLANT
DRAPE ORTHO SPLIT 77X108 STRL (DRAPES) ×2
DRAPE POUCH INSTRU U-SHP 10X18 (DRAPES) ×2 IMPLANT
DRAPE SURG 17X11 SM STRL (DRAPES) ×2 IMPLANT
DRAPE SURG ORHT 6 SPLT 77X108 (DRAPES) ×2 IMPLANT
DRAPE U-SHAPE 47X51 STRL (DRAPES) ×2 IMPLANT
DRSG EMULSION OIL 3X16 NADH (GAUZE/BANDAGES/DRESSINGS) ×2 IMPLANT
DRSG MEPILEX BORDER 4X4 (GAUZE/BANDAGES/DRESSINGS) ×2 IMPLANT
DRSG MEPILEX BORDER 4X8 (GAUZE/BANDAGES/DRESSINGS) ×2 IMPLANT
DRSG PAD ABDOMINAL 8X10 ST (GAUZE/BANDAGES/DRESSINGS) ×2 IMPLANT
DURAPREP 26ML APPLICATOR (WOUND CARE) ×2 IMPLANT
ELECT BLADE TIP CTD 4 INCH (ELECTRODE) ×2 IMPLANT
ELECT REM PT RETURN 9FT ADLT (ELECTROSURGICAL) ×2
ELECTRODE REM PT RTRN 9FT ADLT (ELECTROSURGICAL) ×1 IMPLANT
EVACUATOR 1/8 PVC DRAIN (DRAIN) ×2 IMPLANT
FACESHIELD LNG OPTICON STERILE (SAFETY) ×8 IMPLANT
GAUZE XEROFORM 5X9 LF (GAUZE/BANDAGES/DRESSINGS) ×2 IMPLANT
GLOVE BIOGEL PI IND STRL 7.5 (GLOVE) ×1 IMPLANT
GLOVE BIOGEL PI IND STRL 8 (GLOVE) ×1 IMPLANT
GLOVE BIOGEL PI INDICATOR 7.5 (GLOVE) ×1
GLOVE BIOGEL PI INDICATOR 8 (GLOVE) ×1
GLOVE ORTHO TXT STRL SZ7.5 (GLOVE) ×4 IMPLANT
GLOVE SURG ORTHO 8.0 STRL STRW (GLOVE) ×2 IMPLANT
GOWN BRE IMP PREV XXLGXLNG (GOWN DISPOSABLE) ×2 IMPLANT
GOWN STRL NON-REIN LRG LVL3 (GOWN DISPOSABLE) ×2 IMPLANT
HANDPIECE INTERPULSE COAX TIP (DISPOSABLE) ×1
IMMOBILIZER KNEE 20 (SOFTGOODS) ×2
IMMOBILIZER KNEE 20 THIGH 36 (SOFTGOODS) ×1 IMPLANT
KIT BASIN OR (CUSTOM PROCEDURE TRAY) ×2 IMPLANT
KIT STIMULAN RAPID CURE  10CC (Orthopedic Implant) ×2 IMPLANT
KIT STIMULAN RAPID CURE 10CC (Orthopedic Implant) ×2 IMPLANT
MANIFOLD NEPTUNE II (INSTRUMENTS) ×2 IMPLANT
NS IRRIG 1000ML POUR BTL (IV SOLUTION) ×4 IMPLANT
PACK TOTAL JOINT (CUSTOM PROCEDURE TRAY) ×2 IMPLANT
POSITIONER SURGICAL ARM (MISCELLANEOUS) ×2 IMPLANT
PRESSURIZER FEMORAL UNIV (MISCELLANEOUS) ×2 IMPLANT
SET HNDPC FAN SPRY TIP SCT (DISPOSABLE) ×1 IMPLANT
SPONGE GAUZE 4X4 STERILE 39 (GAUZE/BANDAGES/DRESSINGS) ×2 IMPLANT
SPONGE LAP 18X18 X RAY DECT (DISPOSABLE) ×10 IMPLANT
SPONGE LAP 4X18 X RAY DECT (DISPOSABLE) IMPLANT
STAPLER SKIN PROX WIDE 3.9 (STAPLE) ×4 IMPLANT
STAPLER VISISTAT 35W (STAPLE) ×2 IMPLANT
SUCTION FRAZIER TIP 10 FR DISP (SUCTIONS) ×2 IMPLANT
SUT PDS AB 1 CT1 27 (SUTURE) ×10 IMPLANT
SUT VIC AB 1 CT1 36 (SUTURE) ×6 IMPLANT
SUT VIC AB 2-0 CT1 27 (SUTURE) ×3
SUT VIC AB 2-0 CT1 TAPERPNT 27 (SUTURE) ×3 IMPLANT
TAPE HYPAFIX 6X30 (GAUZE/BANDAGES/DRESSINGS) ×2 IMPLANT
TOWEL OR 17X26 10 PK STRL BLUE (TOWEL DISPOSABLE) ×4 IMPLANT
TOWER CARTRIDGE SMART MIX (DISPOSABLE) ×2 IMPLANT
TRAY FOLEY CATH 14FRSI W/METER (CATHETERS) ×2 IMPLANT
TUBE ANAEROBIC SPECIMEN COL (MISCELLANEOUS) ×4 IMPLANT
WATER STERILE IRR 1500ML POUR (IV SOLUTION) ×2 IMPLANT

## 2011-09-03 NOTE — Anesthesia Preprocedure Evaluation (Addendum)
Anesthesia Evaluation  Patient identified by MRN, date of birth, ID band Patient awake    Reviewed: Allergy & Precautions, H&P , NPO status , Patient's Chart, lab work & pertinent test results, reviewed documented beta blocker date and time   Airway Mallampati: III TM Distance: >3 FB Neck ROM: full    Dental No notable dental hx. (+) Teeth Intact and Dental Advisory Given   Pulmonary asthma , sleep apnea and Continuous Positive Airway Pressure Ventilation ,  clear to auscultation  Pulmonary exam normal       Cardiovascular Exercise Tolerance: Poor hypertension, On Home Beta Blockers + dysrhythmias Atrial Fibrillation regular Normal Cardiomyopathy.  Had CHF when was in AF.  Cardioverted and is NSR now.   Neuro/Psych Depression L side weakness CVA, Residual Symptoms Negative Psych ROS   GI/Hepatic negative GI ROS, Neg liver ROS,   Endo/Other  Negative Endocrine ROS  Renal/GU negative Renal ROS  Genitourinary negative   Musculoskeletal   Abdominal   Peds  Hematology negative hematology ROS (+)   Anesthesia Other Findings   Reproductive/Obstetrics negative OB ROS                          Anesthesia Physical Anesthesia Plan  ASA: III  Anesthesia Plan: General   Post-op Pain Management:    Induction: Intravenous  Airway Management Planned: Oral ETT  Additional Equipment:   Intra-op Plan:   Post-operative Plan: Extubation in OR  Informed Consent: I have reviewed the patients History and Physical, chart, labs and discussed the procedure including the risks, benefits and alternatives for the proposed anesthesia with the patient or authorized representative who has indicated his/her understanding and acceptance.   Dental Advisory Given  Plan Discussed with: CRNA and Surgeon  Anesthesia Plan Comments:         Anesthesia Quick Evaluation

## 2011-09-03 NOTE — H&P (View-Only) (Signed)
H&P performed 08/29/11 Dictation # [id number]

## 2011-09-03 NOTE — Interval H&P Note (Signed)
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 After consideration of risks, benefits and other options for treatment, the patient has consented to  Procedure(s) (LRB): RIGHT removal of deep implants plus EXCISIONAL TOTAL HIP ARTHROPLASTY WITH ANTIBIOTIC SPACERS (Right) as a surgical intervention .  The patients' history has been reviewed, patient examined, no change in status, stable for surgery.  I have reviewed the patients' chart and labs.  Questions were answered to the patient's satisfaction.     [name]

## 2011-09-03 NOTE — Preoperative (Signed)
Beta Blockers   Reason not to administer Beta Blockers:pt took Betapace and Metoprolol at 0400 09-03-11

## 2011-09-03 NOTE — Progress Notes (Signed)
ANTIBIOTIC CONSULT NOTE - INITIAL  Pharmacy Consult for Vancomycin Indication:  Chronic R hip infection  Allergies  Allergen Reactions  . Coumadin Other (See Comments)    Caused her to have a stroke   Patient Measurements:   Wt as of 2/13:  118 kg  Vital Signs: Temp: 97.5 F (36.4 C) (02/18 1545) BP: 93/61 mmHg (02/18 1545) Pulse Rate: 65  (02/18 1545) Intake/Output from previous day:   Intake/Output from this shift: Total I/O In: 4981 [P.O.:220; I.V.:3500; Blood:1261] Out: 2415 [Urine:245; Drains:320; Blood:1850]  Labs:  [name] 09/03/11 1111  WBC 18.4*  HGB 10.3*  PLT 361  LABCREA --  CREATININE 1.88*   The CrCl is unknown because both a height and weight (above a minimum accepted value) are required for this calculation. No results found for this basename: VANCOTROUGH:2,VANCOPEAK:2,VANCORANDOM:2,GENTTROUGH:2,GENTPEAK:2,GENTRANDOM:2,TOBRATROUGH:2,TOBRAPEAK:2,TOBRARND:2,AMIKACINPEAK:2,AMIKACINTROU:2,AMIKACIN:2, in the last 72 hours   Assessment:  [name] w/ subsequent fall and periprosthetic fracture and postoperative wound infection.  Patient was admitted 2/18 for removal of deep implants + excisional total hip arthroplasty with abx spacers placement.  Patient received pre-op Ancef at 0800 today. Plan to continue abx with Vancomycin  Latest weight:  128 kg as of 2/12  Afebrile, leukocytosis  Scr of 1.88, normalized CrCl 34 ml/min, GC CrCl 57 ml/min  Pending body fluid cx  Goal of Therapy:  Vancomycin trough level 15-20 mcg/ml  Plan:   Vancomycin 2000 mg IV x 1 as load then 1500 mg IV q24h.    F/u renal fxn and adjust as needed.   [name] Thi 09/03/2011,4:59 PM

## 2011-09-03 NOTE — Anesthesia Postprocedure Evaluation (Signed)
  Anesthesia Post-op Note  Patient: [name]  Procedure(s) Performed: Procedure(s) (LRB): EXCISIONAL TOTAL HIP ARTHROPLASTY WITH ANTIBIOTIC SPACERS (Right)  Patient Location: PACU  Anesthesia Type: General  Level of Consciousness: awake and alert   Airway and Oxygen Therapy: Patient Spontanous Breathing  Post-op Pain: mild  Post-op Assessment: Post-op Vital signs reviewed, Patient's Cardiovascular Status Stable, Respiratory Function Stable, Patent Airway and No signs of Nausea or vomiting  Post-op Vital Signs: stable  Complications: No apparent anesthesia complications

## 2011-09-03 NOTE — Transfer of Care (Signed)
Immediate Anesthesia Transfer of Care Note  Patient: [name]  Procedure(s) Performed: Procedure(s) (LRB): EXCISIONAL TOTAL HIP ARTHROPLASTY WITH ANTIBIOTIC SPACERS (Right)  Patient Location: PACU  Anesthesia Type: General  Level of Consciousness: awake, alert , oriented and patient cooperative  Airway & Oxygen Therapy: Patient Spontanous Breathing and Patient connected to face mask oxygen  Post-op Assessment: Report given to PACU RN, Post -op Vital signs reviewed and stable and Patient moving all extremities  Post vital signs: Reviewed and stable  Complications: No apparent anesthesia complications

## 2011-09-03 NOTE — Brief Op Note (Signed)
09/03/2011  10:57 AM  PATIENT:  [name]  66 y.o. female  PRE-OPERATIVE DIAGNOSIS:  Right Hip Periprosthetic fracture Non Union verses Infected Non Union, infected right THR  POST-OPERATIVE DIAGNOSIS:   Right Hip Periprosthetic fracture Non Union verses Infected Non Union, infected right THR   PROCEDURE:  Procedure(s) (LRB): Resection right TOTAL HIP ARTHROPLASTY, removal of deep implants including allograft strut,  Placement of ANTIBIOTIC SPACERS (Right) And Stimulan beeds with antibiotic  SURGEON:  Surgeon(s) and Role:    * [name], MD - Primary  PHYSICIAN ASSISTANT:  Danae Orleans, PA-C  ANESTHESIA:   general  EBL:  Total I/O In: 2511 [I.V.:1250; Blood:1261] Out: 2025 [Urine:175; Blood:1850]  BLOOD ADMINISTERED:4 Units PRBC  DRAINS: (2 medium) Hemovact drain(s) in the deep and superficial layers of the right hip with  Suction Open   LOCAL MEDICATIONS USED:  NONE  SPECIMEN:  Source of Specimen:  Rigth hip joint fluid  DISPOSITION OF SPECIMEN:  PATHOLOGY  COUNTS:  YES  TOURNIQUET:  * No tourniquets in log *  DICTATION: .Other Dictation: Dictation Number [id number]  PLAN OF CARE: Admit to inpatient   PATIENT DISPOSITION:  PACU - hemodynamically stable.   Delay start of Pharmacological VTE agent (>24hrs) due to surgical blood loss or risk of bleeding: yes

## 2011-09-03 NOTE — Progress Notes (Signed)
Pt set up on Cpap 14 cmh20 pt using her mask from home. 2 lpm bled in 02.  Tolerating well. No issues to report at this time.

## 2011-09-03 NOTE — Plan of Care (Signed)
Problem: Consults Goal: Diagnosis- Total Joint Replacement Revision Total Hip     

## 2011-09-03 NOTE — Progress Notes (Signed)
Spoke with patient regarding PICC placement including risks, benefits, and alternatives. Patient was able to return understanding of need and risks. Asks to wait until 09-04-11 for placement. Opal Sidles Primary Rn notified. Edson Snowball RN [facility]

## 2011-09-03 NOTE — Interval H&P Note (Signed)
History and Physical Interval Note:  09/03/2011 7:07 AM  [name]  has presented today for surgery, with the diagnosis of Right Hip Periprosthetic Non Union verses Infected None Union  The various methods of treatment have been discussed with the patient and family. After consideration of risks, benefits and other options for treatment, the patient has consented to  Procedure(s) (LRB): RIGHT HIP Removal of deep implants plus EXCISIONAL TOTAL HIP ARTHROPLASTY WITH ANTIBIOTIC SPACERS (Right) as a surgical intervention .  The patients' history has been reviewed, patient examined, no change in status, stable for surgery.  I have reviewed the patients' chart and labs.  Questions were answered to the patient's satisfaction.     [name]

## 2011-09-03 NOTE — Op Note (Signed)
[name], [name]                 ACCOUNT NO.:  [id number]  MEDICAL RECORD NO.:  [id number]  LOCATION:  [id number]                         FACILITY:  [facility]  PHYSICIAN:  Pietro Cassis. Alvan Dame, M.D.  DATE OF BIRTH:  16-Dec-1945  DATE OF PROCEDURE: DATE OF DISCHARGE:                              OPERATIVE REPORT   PREOPERATIVE DIAGNOSIS: 1. 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 2. Morbid obesity.  POSTOPERATIVE DIAGNOSIS: 1. 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 2. Morbid obesity.  PROCEDURE: 1. Sharp excisional debridement of right hip wound, size is     approximately 20 inches in length.  This included debridement of     skin, subcutaneous fat and scar, muscle tendon and bone. 2. Removal of deep implants including a cable plate system, screws,     broken screws as well as an allograft previously used that had     fractured. 3. Resection of a right total hip replacement requiring osteotomy of     the proximal femur. 4. Placement of antibiotic spacers in the proximal femur as well as     the use of Stimulan beads.  Using a combination of vancomycin and     tobramycin. 5. Irrigation of the hip using a total of 12 L of normal saline     solution pulse lavage.  SURGEON:  Pietro Cassis. Alvan Dame, M.D.  ASSISTANT:  Danae Orleans, PA  Please note that Mr. Guinevere Scarlet was present for the entirety of the case from perioperative positioning, leg management, wound management, retractors, general facilitation of the case due to the size of the extremity and primary wound closure.  ANESTHESIA:  General.  SPECIMENS:  Joint fluid, was sent to pathology, upon encountering a large area of purulence.  DRAINS:  Two Hemovacs were placed.  One medium Hemovac was placed deep to the iliotibial band and one was placed on top of it.  BLOOD LOSS:   Approximately 1800 mL.  FLUIDS:  She did receive 4 units of packed red blood cells in the operating room, with a starting hematocrit of 30.  COMPLICATIONS:  None apparent.  INDICATIONS FOR PROCEDURE:  Ms [name] is a 66 year old patient of mine with an index right total-hip replacement within a year of time.  She initially had done well with a healed right hip wound, but had early complication of a fall and sustained a periprosthetic femur fracture. This was treated with open reduction internal fixation using cables. She had been doing well until she fell again with a more significant and extensile periprosthetic fracture beneath the stem.  This required open reduction internal fixation using cable plate and allograft.  Given the size of her lower extremity, her wound continued to drain requiring repeat I and D, and placement of wound VAC.  She presented to the office recently after making some progress clinically, but radiographically revealed a failure of the periprosthetic fracture, indicating a nonunion and most likely infection based on her complicating features.  She had elevated labs on workup.  After extensive discussion  with she and her husband regarding treatment plan, treatment was outlined as above.  Risks of persistent recurring infection, risks of girdlestone hip, risk of DVT, perioperative cardiac and pulmonary events were all reviewed and discussed.  Consent was obtained for benefit of pain relief and management of infection.  PROCEDURE IN DETAIL:  The patient was brought to the operative theater. Once adequate anesthesia was established, she was positioned into the left lateral decubitus position, the right side up.  We used the pegboard positioner.  She still had about a 5 inch area where her wound VAC was used and this was removed and the right thigh prescrubbed with Betadine.  There was definitely an odor to the wound area.  However, the wound itself appeared to  be healing with a granulating bed in this lateral wound at this point.  The right hip was then as noted pre scrubbed and then prepped and draped in sterile fashion using DuraPrep.  A time-out was performed identifying the patient, planned procedure, and extremity.  Prior to placing the Ioban drapes I demarcated an incision, which included the length of her previous incision, but excised this area where the wound VAC had been placed.  An elliptical incision was made around this area and extending proximal and distal.  Please note that approximately 30-40 minutes at this point was carried out in  exposure which included significant sharp debridement of skin, subcutaneous fat, scar down to the iliotibial band.  Once the iliotibial band was opened we encountered this large amount of purulence, which was cultured.  I further opened up the iliotibial band.  Again a large portion of this was carried out to the debridement portion of it for exposure purpose.  I first exposed the distal shaft portion of the femur.  I cut all cables, removed 2 screw-head fragments.  I then removed the remaining screws from the plate.  The plate was then removed as was the proximal portion, allograft, which had fragmented off, the distal portion had started to grow back into the distal femur and was removed using an osteotome.  Again, debridement of the distal canal of the femur was carried out. Once I had the distal canal exposed, I hand reamed it once and reamed up to a 12 mm straight reamer.  I then irrigated this portion of the wound with about 2 L of normal saline solution including the use of a canal brush irrigator in the canal.  At this point, I attended to the proximal femur, I was able to expose the proximal femur and identify the joint.  Given the fact that this was a free-floating segment at this point made it a little bit more of a challenge to dislocate the hip, but we were able to dislocate it  and remove the femoral head.  I then used an oscillating saw to create an osteotomy of the proximal femur.  The bone quality was poor, particularly around the periphery of the bone, however, the bone had definitely adhered to this prosthesis.  In removing the prosthesis the proximal femur was fragmented.  At this point, attention was now directed to the acetabulum with retractors placed anterior and inferior.  I removed the polyethylene liner using the 6.5 cancellous screw.  I then removed the acetabular ilium screw.  I replaced the liner and then using the cup extractor set removed the acetabulum with minimal bone loss.  At this point, I irrigated the entire hip wound area out with a total of 3  L of normal saline solution, total of 9 L at this point.  On the back table, we initially mixed 2 batches of cement, each batch of cement was mixed with 2 g of vancomycin and 2.4 g of tobramycin.  I placed a ball of this cement into the acetabulum and rolled the portion that appeared to be placed along the proximal femur.  I mixed another batch of cement to fill in a little bit of a void here again with 2 g of vancomycin and 2.4 tobramycin.  Also on the back table, we had mixed the Stimulan beads, an absorbable vehicle of antibiotic transmission or elution.  We used two 10 cc batches, each 10 cc was mixed with 1 g of vancomycin and 1 of tobramycin.  The beads were made on the back table and used later.  Following the irrigation and placement of the antibiotic laden cement portions, I placed a medium Hemovac drain deep.  We re-irrigated this hip area as the cement was cooling within the 4th bag of fluids.  I then reapproximated the iliotibial band using #1 PDS.  I placed a second Hemovac in this superficial layer as well as placed 20 mL of Stimulan beads into the soft tissue area.  The subcutaneous layer was then reapproximated using 2-0 Vicryl and staples on the skin.  I was able to  reapproximate the whole wound at this point.  The hip was cleaned, dried and dressed sterilely using Xeroform and a bulky dressing.  She was then brought to the recovery room, extubated, in stable condition, tolerated the procedure well.     Pietro Cassis Alvan Dame, M.D.     MDO/MEDQ  D:  09/03/2011  T:  09/03/2011  Job:  [id number]

## 2011-09-04 ENCOUNTER — Inpatient Hospital Stay (HOSPITAL_COMMUNITY): Payer: Medicare Other

## 2011-09-04 DIAGNOSIS — D62 Acute posthemorrhagic anemia: Secondary | ICD-10-CM

## 2011-09-04 DIAGNOSIS — Z9660 Presence of unspecified orthopedic joint implant: Secondary | ICD-10-CM

## 2011-09-04 DIAGNOSIS — Y831 Surgical operation with implant of artificial internal device as the cause of abnormal reaction of the patient, or of later complication, without mention of misadventure at the time of the procedure: Secondary | ICD-10-CM

## 2011-09-04 DIAGNOSIS — T84049A Periprosthetic fracture around unspecified internal prosthetic joint, initial encounter: Secondary | ICD-10-CM

## 2011-09-04 LAB — BASIC METABOLIC PANEL
BUN: 37 mg/dL — ABNORMAL HIGH (ref 6–23)
CO2: 28 mEq/L (ref 19–32)
Calcium: 8.3 mg/dL — ABNORMAL LOW (ref 8.4–10.5)
Chloride: 102 mEq/L (ref 96–112)
Creatinine, Ser: 2.23 mg/dL — ABNORMAL HIGH (ref 0.50–1.10)
GFR calc Af Amer: 26 mL/min — ABNORMAL LOW (ref >90)
GFR calc non Af Amer: 22 mL/min — ABNORMAL LOW (ref >90)
Glucose, Bld: 99 mg/dL (ref 70–99)
Potassium: 4.8 mEq/L (ref 3.5–5.1)
Sodium: 135 mEq/L (ref 135–145)

## 2011-09-04 LAB — PREPARE RBC (CROSSMATCH)

## 2011-09-04 LAB — CBC
HCT: 25 % — ABNORMAL LOW (ref 36.0–46.0)
Hemoglobin: 8.2 g/dL — ABNORMAL LOW (ref 12.0–15.0)
MCH: 27.4 pg (ref 26.0–34.0)
MCHC: 32.8 g/dL (ref 30.0–36.0)
MCV: 83.6 fL (ref 78.0–100.0)
Platelets: 320 10*3/uL (ref 150–400)
RBC: 2.99 MIL/uL — ABNORMAL LOW (ref 3.87–5.11)
RDW: 16.3 % — ABNORMAL HIGH (ref 11.5–15.5)
WBC: 20.1 10*3/uL — ABNORMAL HIGH (ref 4.0–10.5)

## 2011-09-04 IMAGING — CR DG CHEST 1V PORT
1 series · 2 of 2 positions shown · non-contrast
Comparison: Portable chest x-ray of [DATE] for a [LQ]

CLINICAL DATA: For central line placement

PORTABLE CHEST - 1 VIEW

[Series 1: AP · U · 2 of 2 slices shown]
[im 1/2]
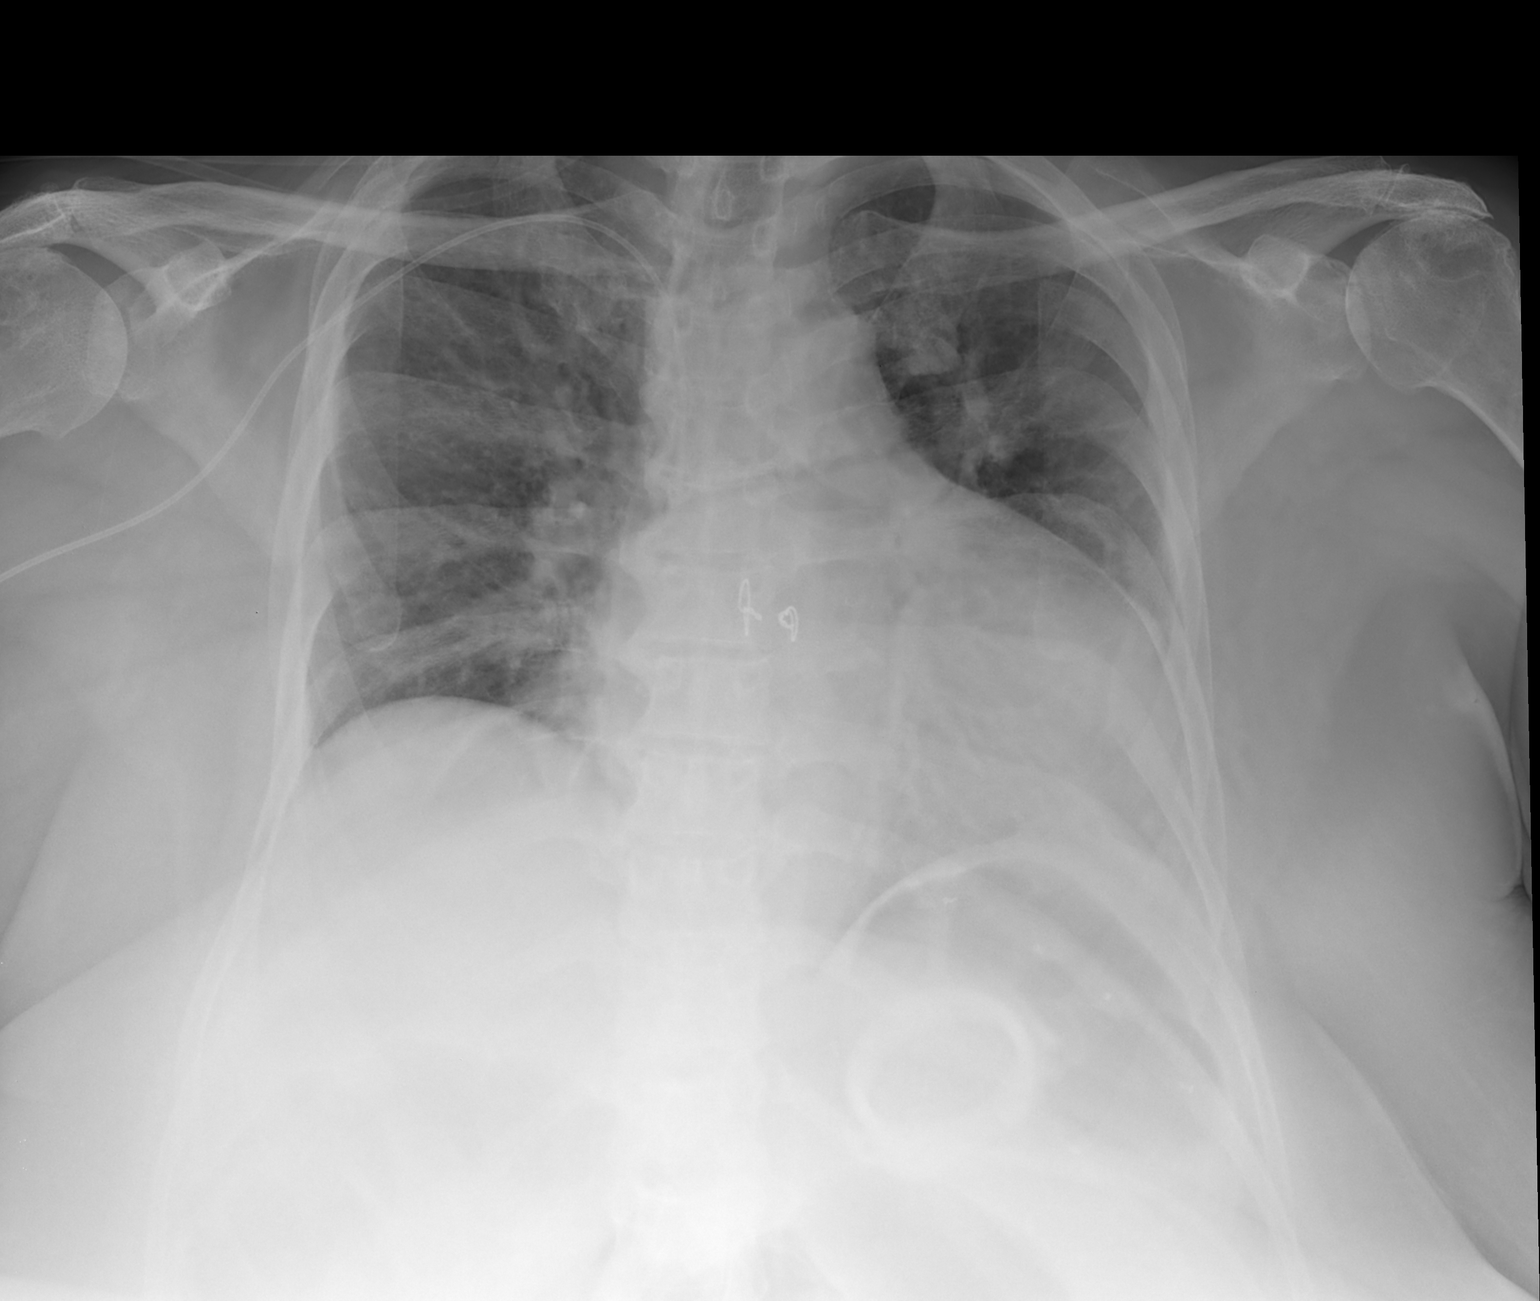
[im 2/2]
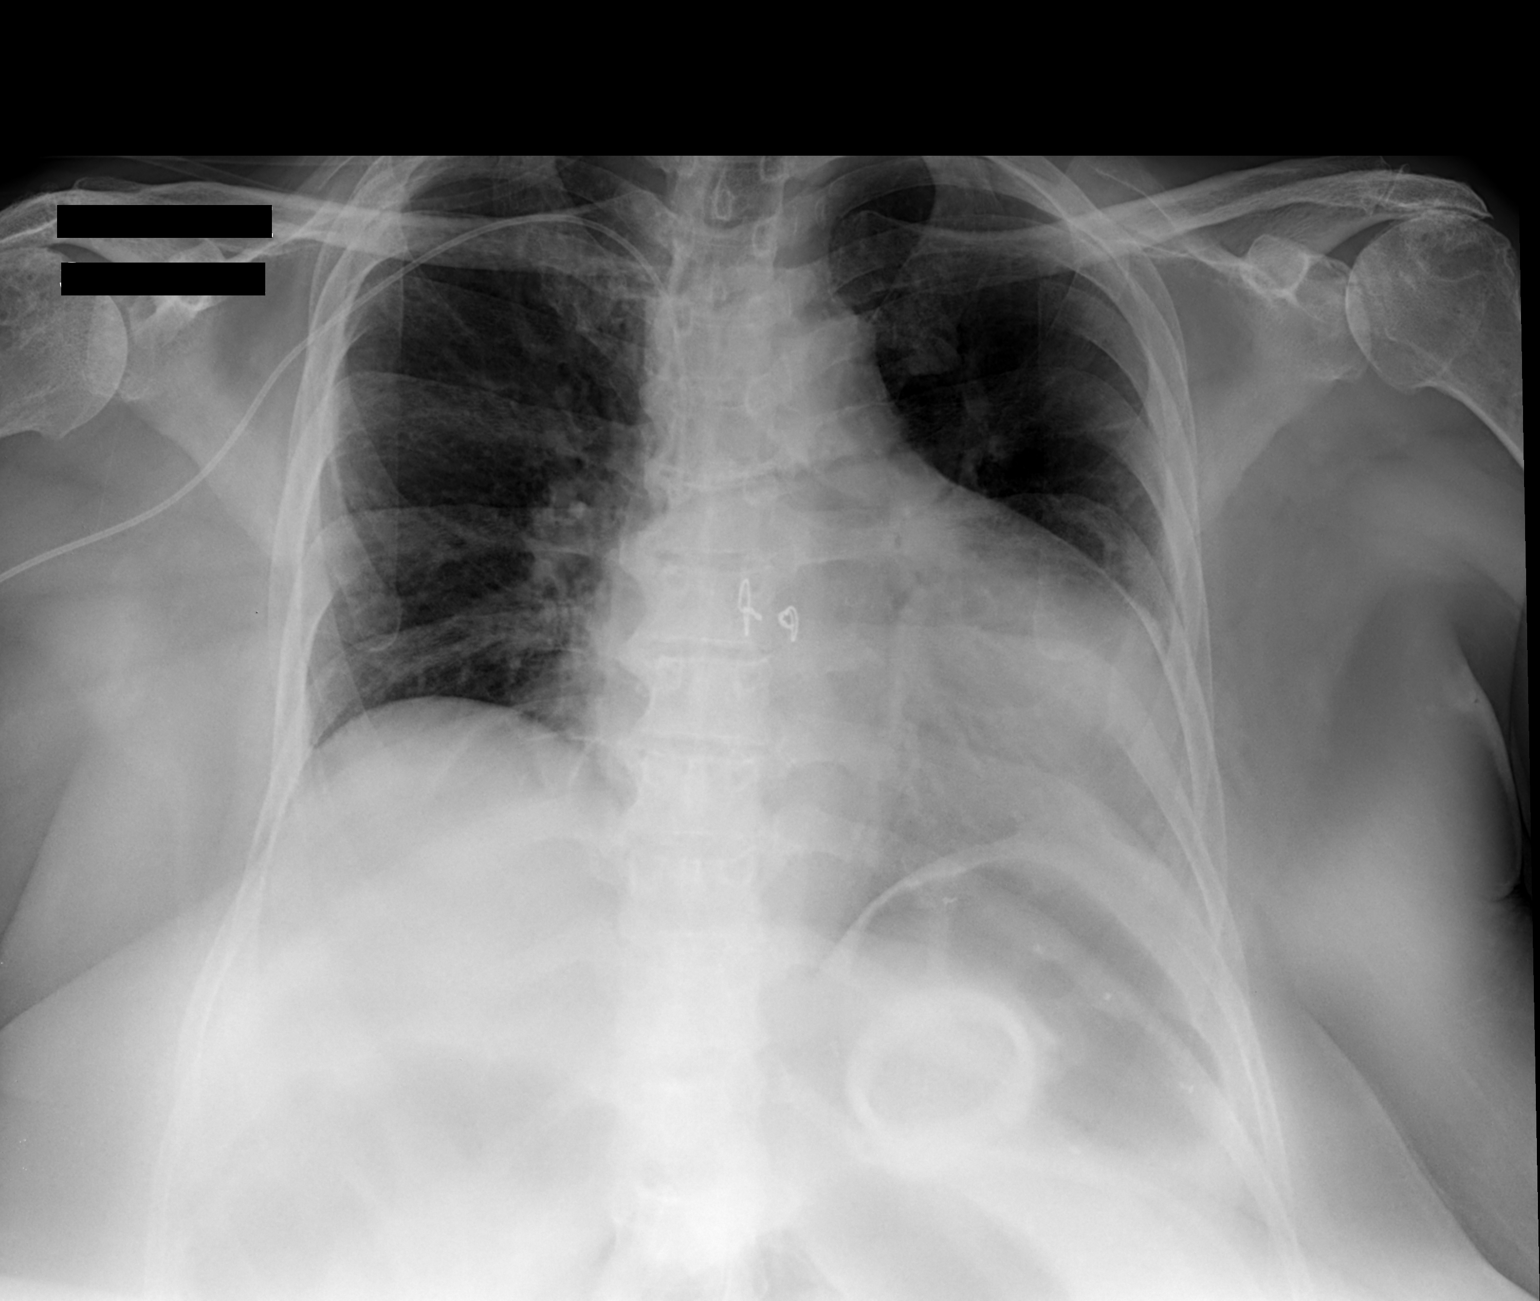

[2 of 2 positions shown; findings below may reference images not displayed]

FINDINGS: The right upper extremity PICC line tip is in the mid
lower SVC.  No pneumothorax is seen.  Cardiomegaly is stable.  A
lap band is present.
IMPRESSION: Right PICC line tip in mid lower SVC.  No pneumothorax.

## 2011-09-04 MED ORDER — SODIUM CHLORIDE 0.9 % IJ SOLN
10.0000 mL | INTRAMUSCULAR | Status: DC | PRN
  Administered 2011-09-05 – 2011-09-06 (×2): 10 mL

## 2011-09-04 MED FILL — Tobramycin Sulfate For Inj 1.2 GM: INTRAMUSCULAR | Qty: 8 | Status: AC

## 2011-09-04 MED FILL — Vancomycin HCl For IV Soln 1 GM (Base Equivalent): INTRAVENOUS | Qty: 8000 | Status: AC

## 2011-09-04 NOTE — Progress Notes (Signed)
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 States patient wants [facility]. Referred to CSW   Anticipated DC Date:  09/06/2011   Anticipated DC Plan:  SKILLED NURSING FACILITY  In-house referral  Clinical Social Worker      DC Planning Services  CM consult      [facility] Choice  NA   Choice offered to / List presented to:  NA   DME arranged  NA      DME agency  NA     [place] arranged  NA      Status of service:  Completed, signed off

## 2011-09-04 NOTE — Clinical Documentation Improvement (Signed)
     BMI DOCUMENTATION CLARIFICATION QUERY  THIS DOCUMENT IS NOT A PERMANENT PART OF THE MEDICAL RECORD  TO RESPOND TO THE THIS QUERY, FOLLOW THE INSTRUCTIONS BELOW:  1. If needed, update documentation for the patient's encounter via the notes activity.  2. Access this query again and click edit on the In [place].  3. After updating, or not, click F2 to complete all highlighted (required) fields concerning your review. Select "additional documentation in the medical record" OR "no additional documentation provided".  4. Click Sign note button.  5. The deficiency will fall out of your In Basket *Please let us know if you are not able to complete this workflow by phone or e-mail (listed below).         09/04/11  Dear Dr. Alvan Dame or Guinevere Scarlet Rolley Sims  In an effort to better capture your patient's severity of illness, reflect appropriate length of stay and utilization of resources, a review of the patient medical record has revealed the following indicators.    Based on your clinical judgment, please clarify and document in a progress note and/or discharge summary the clinical condition associated with the following supporting information:  In responding to this query please exercise your independent judgment.  The fact that a query is asked, does not imply that any particular answer is desired or expected.  Pt's BMI=   43.4.    Please clarify whether or not BMI in setting of Sleep Apnea  can be linked to one of he diagnoses listed below and document in pn  and d/c. Thank You!  [facility]ACT[facility]: When linking BMI to a diagnosis please document both BMI and diagnosis together in pn for accuracy of SOI and ROM.  Choose all that apply Possible Clinical conditions  Morbid Obesity W/ BMI= 43.4   Obesity Hypoventilation Syndrome (OHS)  Underweight w/BMI=  Other condition___________________  Cannot Clinically determine _____________  Risk Factors: Sleep Apnea,   Sign &  Symptoms:  BMI-43.4 5'5"/260lbs  Treatment Reg diet  Medications: O2 @ 2l/min/[place]    Reviewed: additional documentation in the medical record  Thank You,  Heloise Beecham  RN, BSN, CCDS Clinical Documentation Specialist [facility] Pager: [phone] / E-mail: [email or web address]

## 2011-09-04 NOTE — Clinical Documentation Improvement (Signed)
    CHF DOCUMENTATION CLARIFICATION QUERY  THIS DOCUMENT IS NOT A PERMANENT PART OF THE MEDICAL RECORD  TO RESPOND TO THE THIS QUERY, FOLLOW THE INSTRUCTIONS BELOW:  1. If needed, update documentation for the patient's encounter via the notes activity.  2. Access this query again and click edit on the In [employer].  3. After updating, or not, click F2 to complete all highlighted (required) fields concerning your review. Select "additional documentation in the medical record" OR "no additional documentation provided".  4. Click Sign note button.  5. The deficiency will fall out of your In Basket *Please let us know if you are not able to complete this workflow by phone or e-mail (listed below).  Please update your documentation within the medical record to reflect your response to this query.                                                                                    09/04/11  Dear Dr. [facility],  In a better effort to capture your patient's severity of illness, reflect appropriate length of stay and utilization of resources, a review of the patient medical record has revealed the following indicators the diagnosis of Heart Failure.    Based on your clinical judgment, please clarify and document in a progress note and/or discharge summary the clinical condition associated with the following supporting information:  In responding to this query please exercise your independent judgment.  The fact that a query is asked, does not imply that any particular answer is desired or expected.  Pt admitted for R THA.  According to H/P pt with CHF. Please clarify the the acuity and type of CHF and document in pn and  d/c summary.   Best Practice:  Documenting the acuity and type of CHF for each inpatient admission provides accuracy and clarity of the SOI/ROM.  p    Possible Clinical Conditions?   Chronic Systolic Congestive Heart Failure  Chronic Diastolic Congestive Heart  Failure  Chronic Systolic  & Diastolic Congestive Heart Failure  Acute Systolic Congestive Heart Failure  Acute Diastolic Congestive Heart Failure  Acute Systolic & Diastolic Congestive Heart Failure  Acute on Chronic Systolic Congestive Heart Failure  Acute on Chronic Diastolic Congestive Heart Failure  Acute on Chronic Systolic & Diastolic  Congestive Heart Failure Other Condition________________________________________ Cannot Clinically Determine  Supporting Information:  Risk Factors: R THA. H/O CHF, HTN  Signs & Symptoms:   Diagnostics: 08/26/11 : ECHO  LEFT VENTRICLE:  -  Left ventricular size was normal.  -  Overall left ventricular systolic function was normal.  -  Left ventricular ejection fraction was estimated to be 55 %.  -  There were no left ventricular regional wall motion        abnormalities.  -  Left ventricular wall thickness was normal.  Treatment: furosemide (LASIX) injection 20 mg   Reviewed:  no additional documentation provided 09/06/2011 ljh  Thank You,  Heloise Beecham  RN, BSN, CCDS Clinical Documentation Specialist [facility] Pager: [phone] / E-mail: [email or web address]

## 2011-09-04 NOTE — Clinical Documentation Improvement (Signed)
Abnormal Labs Clarification  THIS DOCUMENT IS NOT A PERMANENT PART OF THE MEDICAL RECORD  TO RESPOND TO THE THIS QUERY, FOLLOW THE INSTRUCTIONS BELOW:  1. If needed, update documentation for the patient's encounter via the notes activity.  2. Access this query again and click edit on the [employer].  3. After updating, or not, click F2 to complete all highlighted (required) fields concerning your review. Select "additional documentation in the medical record" OR "no additional documentation provided".  4. Click Sign note button.  5. The deficiency will fall out of your InBasket *Please let us know if you are not able to complete this workflow by phone or e-mail (listed below).  Please update your documentation within the medical record to reflect your response to this query.                                                                                   09/04/11  Dear Dr. Alvan Dame  [facility]  In a better effort to capture your patient's severity of illness, reflect appropriate length of stay and utilization of resources, a review of the medical record has revealed the following indicators.    Based on your clinical judgment, please clarify and document in a progress note and/or discharge summary the clinical condition associated with the following supporting information:  In responding to this query please exercise your independent judgment.  The fact that a query is asked, does not imply that any particular answer is desired or expected.  Abnormal findings (laboratory, x-ray, pathologic, and other diagnostic results) are not coded and reported unless the physician indicates their clinical significance.   The medical record reflects the following clinical findings, please clarify the diagnostic and/or clinical significance:      Pt admitted for R THA  According to lab pt's BUN/CR/GFR= 33/1.88/32   Please clarify the underlying diagnoses responsible for the abnormal  lab result ( BUN/CR/GFR) and document in pn and d/c summary. Thank you!   Best Practice:   List all diagnosis/conditions associated with abnormal lab/test result in progress notes or d/c summary.   Possible Clinical Conditions?                                 Supporting Information: Low H/H necessitated blood transfusion  Other Condition___________________                Cannot Clinically Determine_________     Component     Latest Ref Rng 09/03/2011  BUN     6 - 23 mg/dL 33 (H)  Creat     0.50 - 1.10 mg/dL 1.88 (H)  GFR calc Af Amer     >90 mL/min 32 (L)   Treatment:  0.9 %  sodium chloride infusion    hetastarch in lactated electrolyte (HEXTEND) 6 % infusion 500 mL    Reviewed:  no additional documentation provided  09/05/2011 ljh   Thank You,  Juluis Rainier J Annalyssa Thune J. Ulanda Edison RN, BSN, CCDS Clinical Documentation Specialist [facility] Dept Pager: [place]

## 2011-09-04 NOTE — Progress Notes (Addendum)
Subjective: 1 Day Post-Op Procedure(s) (LRB): EXCISIONAL TOTAL HIP ARTHROPLASTY WITH ANTIBIOTIC SPACERS (Right)   Patient reports pain as mild. Output much lower than input. Otherwise no events.  Objective:   VITALS:   Filed Vitals:   09/04/11 0700  BP: 99/62  Pulse: 85  Temp: 98 F (36.7 C)  Resp: 16    Neurovascular intact Incision: dressing C/D/I  LABS  Basename 09/04/11 0355 09/03/11 1111  HGB 8.2* 10.3*  HCT 25.0* 31.0*  WBC 20.1* 18.4*  PLT 320 361     Basename 09/04/11 0355 09/03/11 1111  NA 135 135  K 4.8 3.9  BUN 37* 33*  CREATININE 2.23* 1.88*  GLUCOSE 99 127*     Assessment/Plan: 1 Day Post-Op Procedure(s) (LRB): EXCISIONAL TOTAL HIP ARTHROPLASTY WITH ANTIBIOTIC SPACERS (Right)   Morbid Obesity W/ BMI = 43.4 Acute Blood Loss Anemia - Will transfuse 2 units of blood today  Foley and HV drains will be maintained until Wednesday Advance diet Up with therapy Discharge to SNF when d/c'ed from hospital, SW to be consulted.    [place] Buras   PAC  09/04/2011, 7:50 AM

## 2011-09-04 NOTE — Progress Notes (Signed)
Utilization review completed.  

## 2011-09-04 NOTE — Clinical Documentation Improvement (Signed)
Anemia Blood Loss Clarification  THIS DOCUMENT IS NOT A PERMANENT PART OF THE MEDICAL RECORD  RESPOND TO THE THIS QUERY, FOLLOW THE INSTRUCTIONS BELOW:  1. If needed, update documentation for the patient's encounter via the notes activity.  2. Access this query again and click edit on the [employer].  3. After updating, or not, click F2 to complete all highlighted (required) fields concerning your review. Select "additional documentation in the medical record" OR "no additional documentation provided".  4. Click Sign note button.  5. The deficiency will fall out of your In Basket *Please let us know if you are not able to complete this workflow by phone or e-mail (listed below).        09/04/11  Dear Dr. Alvan Dame Rolley Sims  In an effort to better capture your patient's severity of illness, reflect appropriate length of stay and utilization of resources, a review of the patient medical record has revealed the following indicators.    Based on your clinical judgment, please clarify and document in a progress note and/or discharge summary the clinical condition associated with the following supporting information:  In responding to this query please exercise your independent judgment.  The fact that a query is asked, does not imply that any particular answer is desired or expected.  According to lab pt's H/H=  8.2/25.0 post op   Please clarify based on abnormal H/H whether or not abn H/H can be further specified as one of the diagnoses listed below and document in pn or d/c summary.  Best Practice: List all diagnosis/conditions associated with abnormal lab/test result in progress notes or d/c summary.    Possible Clinical Conditions?   " Expected Acute Blood Loss Anemia  " Acute Blood Loss Anemia   " Other Condition________________  " Cannot Clinically Determine  Risk Factors: (recent surgery, pre op anemia, EBL in OR)  Supporting Information:  Signs and  Symptoms   Diagnostics: Component     Latest Ref Rng 09/03/2011 09/04/2011  HGB     12.0 - 15.0 g/dL 10.3 (L) 8.2 (L)  HCT     36.0 - 46.0 % 31.0 (L) 25.0 (L)    Treatments: vancomycin (VANCOCIN) 1,000 mg in sodium chloride 0.9 % 250 mL IVPB   Tobramycin Sulfate POWD   ferrous sulfate tablet 325 mg     Transfusion: Blood:1850] BLOOD ADMINISTERED:4 Units PRBC DRAINS: (2 medium) Hemovact drain(s) in the deep and superficial layers of the right hip with  Suction Open    IV fluids: hetastarch in lactated electrolyte (HEXTEND) 6 % infusion 500 mL  Serial H&H monitoring Medications    ferrous sulfate tablet 325 mg       Reviewed: additional documentation in the medical record  Thank You,  Heloise Beecham  RN, BSN, CCDS Clinical Documentation Specialist [place] Dept Pager: [phone] / E-mail: [email or web address]

## 2011-09-04 NOTE — Plan of Care (Signed)
Problem: Phase II Progression Outcomes Goal: Ambulates Outcome: Not Met (add Reason) Pt received blood, pt bedrest 09/04/11.

## 2011-09-04 NOTE — Progress Notes (Signed)
09/04/11 0600 Nursing Olivia Mackie Shuford Pa called re patient total foley output 7p-6a 225cc. Patient positive for over 2700 cc fluid since surgery. Order received to leave foley in place for strict I/o today. No further orders given.

## 2011-09-04 NOTE — Progress Notes (Signed)
PT order received but evaluation deferred secondary to decreased Hgb, decreased BP and ongoing transfusion.  Will follow in am.

## 2011-09-04 NOTE — Consult Note (Signed)
Date of Admission:  09/03/2011  Date of Consult:  09/04/2011  Reason for Consult: Infected Total hip arthoplasty Referring Physician: Dr. Alvan Dame   HPI: [name] is an 66 y.o. female with hx of ATrial fibrillation, HTN, CKD status post  total hip arthroplasty with subsequent fall and periprosthetic fracture x2  Who developed  subsequent wound breakdown. She was found to have persistently high ESR and was brought to OR fro I and D of hip. IN OR grossly purulent matieral was encountered and was sent for cultured. All prosthetic material was removed and antibiotic beads were placed. She is currently on vancomycin. Her preoperative PCR nares was positive for MSSA in August and on the 12th. IN looking thru epic records she appears to have had  vancomycin at 733, and  Ancef at 801 with cultures labelled as having been taken after 8am. In any case one day into incubation no organism is as of yet growing. She does have CKD and has been on large dose fo lasix in addition to multiple other meds to control her HTN and fluid status. She also does have gout.     Past Medical History  Diagnosis Date  . Hypertension   . Depression   . Fractures, stress     in both feet  . Kidney disease     pt states creatinine is high  . 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 Stroke   . Gout   . Asthma     MILD NOW-NO INHALERS  . Sleep apnea     USES CPAP  . Anemia   . Blood transfusion   . Arthritis     HIP  . Atrial fibrillation     PT TAKES ASPIRIN AS BLOOD THINNER-HX OF CEREBRAL BLEED WHILE ON COUMADIN  . Arrhythmia   . Cardiomyopathy     Past Surgical History  Procedure Date  . Cholecystectomy   . 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 Joint replacement     right hip and right femur x 2  . Right femur fracture x2     FRACTURES WERE AFTER AFTER RIGHT HIP REPLACEMENT  ergies:   Allergies  Allergen Reactions  . Coumadin Other (See Comments)    Caused her to have a  stroke     Medications: I have reviewed patients current medications as documented in Epic Anti-infectives     Start     Dose/Rate Route Frequency Ordered Stop   09/04/11 1800   vancomycin (VANCOCIN) 1,750 mg in sodium chloride 0.9 % 500 mL IVPB  Status:  Discontinued        1,750 mg 250 mL/hr over 120 Minutes Intravenous Every 24 hours 09/03/11 1705 09/03/11 1706   09/04/11 1800   vancomycin (VANCOCIN) 1,500 mg in sodium chloride 0.9 % 500 mL IVPB        1,500 mg 250 mL/hr over 120 Minutes Intravenous Every 24 hours 09/03/11 1706     09/03/11 1715   vancomycin (VANCOCIN) 2,000 mg in sodium chloride 0.9 % 500 mL IVPB        2,000 mg 250 mL/hr over 120 Minutes Intravenous  Once 09/03/11 1705 09/03/11 2054   09/03/11 0905   Vancomycin HCl POWD  Status:  Discontinued          As needed 09/03/11 0906 09/03/11 1053   09/03/11 0902   Tobramycin Sulfate POWD  Status:  Discontinued  As needed 09/03/11 0905 09/03/11 1053   09/03/11 0815   tobramycin (NEBCIN) powder 2.4 g  Status:  Discontinued        2.4 g Topical To Surgery 09/03/11 0806 09/03/11 1602   09/03/11 0545   ceFAZolin (ANCEF) IVPB 2 g/50 mL premix        2 g 100 mL/hr over 30 Minutes Intravenous  Once 09/03/11 0538 09/03/11 0801   08/31/11 1715   ceFAZolin (ANCEF) IVPB 1 g/50 mL premix  Status:  Discontinued        1 g 100 mL/hr over 30 Minutes Intravenous 60 min pre-op 08/31/11 1714 08/31/11 1717          Social History:  reports that she has never smoked. She has never used smokeless tobacco. She reports that she does not drink alcohol or use illicit drugs.  Family History  Problem Relation Age of Onset  . Diabetes Mother   . Hypertension Mother   . Hodgkin's lymphoma  32    dscd---HODGKINS DISEASE  . Cancer Father     As in HPI and primary teams notes otherwise 12 point review of systems is negative  Blood pressure 88/54, pulse 83, temperature 97.7 F (36.5 C), temperature source Oral, resp. rate  16, height 5\' 5"  (1.651 m), weight 260 lb (117.935 kg), SpO2 100.00%. General: Alert and awake, oriented x3, not in any acute distress. HEENT: anicteric sclera, pupils reactive to light and accommodation, EOMI, oropharynx clear and without exudate CVS regular rate, normal r,  no murmur rubs or gallops Chest: clear to auscultation bilaterally, no wheezing, rales or rhonchi Abdomen: soft nontender, nondistended, normal bowel sounds, Extremities: right hip with dressing and two vacuum drains Skin: no rashes Neuro: nonfocal, strength and sensation intact   Results for orders placed during the hospital encounter of 09/03/11 (from the past 48 hour(s))  TYPE AND SCREEN     Status: Normal (Preliminary result)   Collection Time   09/03/11  6:00 AM      Component Value Range Comment   ABO/RH(D) O POS      Antibody Screen NEG      Sample Expiration 09/06/2011      Unit Number [id number]      Blood Component Type RBC LR PHER1      Unit division 00      Status of Unit ISSUED,FINAL      Transfusion Status OK TO TRANSFUSE      Crossmatch Result Compatible      Unit Number [id number]      Blood Component Type RBC LR PHER1      Unit division 00      Status of Unit ISSUED,FINAL      Transfusion Status OK TO TRANSFUSE      Crossmatch Result Compatible      Unit Number [id number]      Blood Component Type RED CELLS,LR      Unit division 00      Status of Unit ISSUED,FINAL      Transfusion Status OK TO TRANSFUSE      Crossmatch Result Compatible      Unit Number [id number]      Blood Component Type RED CELLS,LR      Unit division 00      Status of Unit ISSUED,FINAL      Transfusion Status OK TO TRANSFUSE      Crossmatch Result Compatible      Unit Number [id number]      Blood Component Type  RED CELLS,LR      Unit division 00      Status of Unit ISSUED      Transfusion Status OK TO TRANSFUSE      Crossmatch Result Compatible      Unit Number [id number]      Blood Component Type RED CELLS,LR        Unit division 00      Status of Unit ISSUED      Transfusion Status OK TO TRANSFUSE      Crossmatch Result Compatible     GRAM STAIN     Status: Normal   Collection Time   09/03/11  8:08 AM      Component Value Range Comment   Specimen Description FLUID SYNOVIAL RIGHT HIP ON SWAB      Special Requests NONE      Gram Stain        Value: ABUNDANT WBC PRESENT, PREDOMINANTLY PMN     NO ORGANISMS SEEN     Gram Stain Report Called to,Read Back By and Verified With:     DAVENPORT,S. RN AT [id number] ON 09/03/11 BY GILLESPIE,B.   Report Status 09/03/2011 FINAL     BODY FLUID CULTURE     Status: Normal (Preliminary result)   Collection Time   09/03/11  8:09 AM      Component Value Range Comment   Specimen Description FLUID SYNOVIAL RIGHT HIP ON SWAB      Special Requests NONE      Gram Stain        Value: ABUNDANT WBC PRESENT, PREDOMINANTLY PMN     NO ORGANISMS SEEN     Gram Stain Report Called to,Read Back By and Verified With: Gram Stain Report Called to,Read Back By and Verified With: DAVENPORT S RN @ [phone] ON 09/03/11 BY Ethel Rana B Performed by [facility]   Culture NO GROWTH 1 DAY      Report Status PENDING     ANAEROBIC CULTURE     Status: Normal (Preliminary result)   Collection Time   09/03/11  8:10 AM      Component Value Range Comment   Specimen Description FLUID SYNOVIAL RIGHT HIP      Special Requests NONE      Gram Stain        Value: NO WBC SEEN     NO SQUAMOUS EPITHELIAL CELLS SEEN     NO ORGANISMS SEEN   Culture        Value: NO ANAEROBES ISOLATED; CULTURE IN PROGRESS FOR 5 DAYS   Report Status PENDING     CBC     Status: Abnormal   Collection Time   09/03/11 11:11 AM      Component Value Range Comment   WBC 18.4 (*) 4.0 - 10.5 (K/uL)    RBC 3.75 (*) 3.87 - 5.11 (MIL/uL)    Hemoglobin 10.3 (*) 12.0 - 15.0 (g/dL)    HCT 31.0 (*) 36.0 - 46.0 (%)    MCV 82.7  78.0 - 100.0 (fL)    MCH 27.5  26.0 - 34.0 (pg)    MCHC 33.2  30.0 - 36.0 (g/dL)    RDW 15.5  11.5 -  15.5 (%)    Platelets 361  150 - 400 (K/uL)   BASIC METABOLIC PANEL     Status: Abnormal   Collection Time   09/03/11 11:11 AM      Component Value Range Comment   Sodium 135  135 - 145 (mEq/L)  Potassium 3.9  3.5 - 5.1 (mEq/L)    Chloride 101  96 - 112 (mEq/L)    CO2 26  19 - 32 (mEq/L)    Glucose, Bld 127 (*) 70 - 99 (mg/dL)    BUN 33 (*) 6 - 23 (mg/dL)    Creatinine, Ser 1.88 (*) 0.50 - 1.10 (mg/dL)    Calcium 8.4  8.4 - 10.5 (mg/dL)    GFR calc non Af Amer 27 (*) >90 (mL/min)    GFR calc Af Amer 32 (*) >90 (mL/min)   CBC     Status: Abnormal   Collection Time   09/04/11  3:55 AM      Component Value Range Comment   WBC 20.1 (*) 4.0 - 10.5 (K/uL)    RBC 2.99 (*) 3.87 - 5.11 (MIL/uL)    Hemoglobin 8.2 (*) 12.0 - 15.0 (g/dL)    HCT 25.0 (*) 36.0 - 46.0 (%)    MCV 83.6  78.0 - 100.0 (fL)    MCH 27.4  26.0 - 34.0 (pg)    MCHC 32.8  30.0 - 36.0 (g/dL)    RDW 16.3 (*) 11.5 - 15.5 (%)    Platelets 320  150 - 400 (K/uL)   BASIC METABOLIC PANEL     Status: Abnormal   Collection Time   09/04/11  3:55 AM      Component Value Range Comment   Sodium 135  135 - 145 (mEq/L)    Potassium 4.8  3.5 - 5.1 (mEq/L)    Chloride 102  96 - 112 (mEq/L)    CO2 28  19 - 32 (mEq/L)    Glucose, Bld 99  70 - 99 (mg/dL)    BUN 37 (*) 6 - 23 (mg/dL)    Creatinine, Ser 2.23 (*) 0.50 - 1.10 (mg/dL)    Calcium 8.3 (*) 8.4 - 10.5 (mg/dL)    GFR calc non Af Amer 22 (*) >90 (mL/min)    GFR calc Af Amer 26 (*) >90 (mL/min)   PREPARE RBC (CROSSMATCH)     Status: Normal   Collection Time   09/04/11  8:30 AM      Component Value Range Comment   Order Confirmation ORDER PROCESSED BY BLOOD BANK         Component Value Date/Time   SDES FLUID SYNOVIAL RIGHT HIP 09/03/2011 0810   SPECREQUEST NONE 09/03/2011 0810   CULT NO ANAEROBES ISOLATED; CULTURE IN PROGRESS FOR 5 DAYS 09/03/2011 0810   REPTSTATUS PENDING 09/03/2011 0810   Dg Chest Port 1 View  09/04/2011  *RADIOLOGY REPORT*  Clinical Data: For central  line placement  PORTABLE CHEST - 1 VIEW  Comparison: Portable chest x-ray of 11/21 for a 1012  Findings: The right upper extremity PICC line tip is in the mid lower SVC.  No pneumothorax is seen.  Cardiomegaly is stable.  A lap band is present.  IMPRESSION: Right PICC line tip in mid lower SVC.  No pneumothorax.  Original Report Authenticated By: Joretta Bachelor, M.D.     Recent Results (from the past 720 hour(s))  SURGICAL PCR SCREEN     Status: Abnormal   Collection Time   08/28/11 10:33 AM      Component Value Range Status Comment   MRSA, PCR NEGATIVE  NEGATIVE  Final    Staphylococcus aureus POSITIVE (*) NEGATIVE  Final   GRAM STAIN     Status: Normal   Collection Time   09/03/11  8:08 AM  Component Value Range Status Comment   Specimen Description FLUID SYNOVIAL RIGHT HIP ON SWAB   Final    Special Requests NONE   Final    Gram Stain     Final    Value: ABUNDANT WBC PRESENT, PREDOMINANTLY PMN     NO ORGANISMS SEEN     Gram Stain Report Called to,Read Back By and Verified With:     DAVENPORT,S. RN AT [id number] ON 09/03/11 BY GILLESPIE,B.   Report Status 09/03/2011 FINAL   Final   BODY FLUID CULTURE     Status: Normal (Preliminary result)   Collection Time   09/03/11  8:09 AM      Component Value Range Status Comment   Specimen Description FLUID SYNOVIAL RIGHT HIP ON SWAB   Final    Special Requests NONE   Final    Gram Stain     Final    Value: ABUNDANT WBC PRESENT, PREDOMINANTLY PMN     NO ORGANISMS SEEN     Gram Stain Report Called to,Read Back By and Verified With: Gram Stain Report Called to,Read Back By and Verified With: DAVENPORT S RN @ [phone] ON 09/03/11 BY Ethel Rana B Performed by [facility]   Culture NO GROWTH 1 DAY   Final    Report Status PENDING   Incomplete   ANAEROBIC CULTURE     Status: Normal (Preliminary result)   Collection Time   09/03/11  8:10 AM      Component Value Range Status Comment   Specimen Description FLUID SYNOVIAL RIGHT HIP   Final     Special Requests NONE   Final    Gram Stain     Final    Value: NO WBC SEEN     NO SQUAMOUS EPITHELIAL CELLS SEEN     NO ORGANISMS SEEN   Culture     Final    Value: NO ANAEROBES ISOLATED; CULTURE IN PROGRESS FOR 5 DAYS   Report Status PENDING   Incomplete      Impression/Recommendation 66 yo with CKD, atrial fibrillation, HTN and THA infection sp I and D and washout with removal of all prosthetic components  1) Prosthetic THA infection: --agree with vancomycin dosed for trough of 15-20 --check esr and crp --if NO organism grows will consider adding rocephin for better cidal activity vs strep and a little Gram negative coverage given that she could have had ancef preoperatively with the vancomycin --followup cultures --pt should receive 8 weeks of parenteral therapy with close followup with Dr. Alvan Dame and myself --she will need close weekly labs for cbc and bmp and also close monitoring of vancomycin levels should she be on this with dosing titrated by SNF pharmacy for trough of 15-20 and all labs faxed to me at [facility] at fax [phone] --I would like to see her back in [facility] prior to her completing her antibiotics  2) CKD: Spoke with Dr. Jimmy Footman re her CKD and my concern to not hurt her renal fxn further with vancomycin, he agrees with stopping lasix for now to err on side of her being fluid positive  3) Screening: check hiv Thank you so much for this interesting consult,   Alcide Evener 09/04/2011, 3:15 PM   [phone] (pager) [phone] (office)

## 2011-09-04 NOTE — Progress Notes (Signed)
CSW consulted for SNF placement. FL2 in shadow chart for MD signature. SNF search initiated in [place]. CSW will meet with pt/family  To provide bed offers when received. Will follow.

## 2011-09-05 LAB — TYPE AND SCREEN
ABO/RH(D): O POS
Antibody Screen: NEGATIVE
Unit division: 0
Unit division: 0
Unit division: 0
Unit division: 0
Unit division: 0
Unit division: 0

## 2011-09-05 LAB — CBC
HCT: 29.5 % — ABNORMAL LOW (ref 36.0–46.0)
Hemoglobin: 9.7 g/dL — ABNORMAL LOW (ref 12.0–15.0)
MCH: 28.2 pg (ref 26.0–34.0)
MCHC: 32.9 g/dL (ref 30.0–36.0)
MCV: 85.8 fL (ref 78.0–100.0)
Platelets: 254 10*3/uL (ref 150–400)
RBC: 3.44 MIL/uL — ABNORMAL LOW (ref 3.87–5.11)
RDW: 16.4 % — ABNORMAL HIGH (ref 11.5–15.5)
WBC: 13.2 10*3/uL — ABNORMAL HIGH (ref 4.0–10.5)

## 2011-09-05 LAB — BASIC METABOLIC PANEL
BUN: 41 mg/dL — ABNORMAL HIGH (ref 6–23)
CO2: 25 mEq/L (ref 19–32)
Calcium: 8.5 mg/dL (ref 8.4–10.5)
Chloride: 99 mEq/L (ref 96–112)
Creatinine, Ser: 2.46 mg/dL — ABNORMAL HIGH (ref 0.50–1.10)
GFR calc Af Amer: 23 mL/min — ABNORMAL LOW (ref >90)
GFR calc non Af Amer: 20 mL/min — ABNORMAL LOW (ref >90)
Glucose, Bld: 87 mg/dL (ref 70–99)
Potassium: 4.4 mEq/L (ref 3.5–5.1)
Sodium: 131 mEq/L — ABNORMAL LOW (ref 135–145)

## 2011-09-05 LAB — GLUCOSE, CAPILLARY: Glucose-Capillary: 103 mg/dL — ABNORMAL HIGH (ref 70–99)

## 2011-09-05 LAB — HIV ANTIBODY (ROUTINE TESTING W REFLEX): HIV: NONREACTIVE

## 2011-09-05 LAB — C-REACTIVE PROTEIN: CRP: 11.92 mg/dL — ABNORMAL HIGH (ref <0.60)

## 2011-09-05 LAB — SEDIMENTATION RATE: Sed Rate: 30 mm/hr — ABNORMAL HIGH (ref 0–22)

## 2011-09-05 MED ORDER — DEXTROSE 5 % IV SOLN
2.0000 g | INTRAVENOUS | Status: DC
  Administered 2011-09-05: 2 g via INTRAVENOUS
  Filled 2011-09-05 (×2): qty 2

## 2011-09-05 NOTE — Progress Notes (Signed)
OT Note:  PT got pt up to chair today with A X 2.  Will check tomorrow to see if pt is ready for OT.  [place], [place] [phone] 09/05/2011

## 2011-09-05 NOTE — Progress Notes (Signed)
Post Op VTE Prophylaxis  Xarelto (rivaroxaban) 10mg  po daily began 09/05/11.  Scr has worsened, 1.88 -> 2.23 -> 2.46, CrCl = 30 ml/min today.  If Scr continues to rise and CrCl falls below 30 ml/min, another agent for VTE prophylaxis must be used since Xarelto is contraindicated at that point.  Thank you, Peggyann Juba, PharmD, BCPS Pager: [phone]

## 2011-09-05 NOTE — Progress Notes (Signed)
CSW met with pt/spouse to assist with d/c planning. Bed offers provided. Pt would like to have her rehab at [facility] ( [place] [facility] CSW is waiting for a decision from [facility]. Will follow to assist with d/c planning to SNF.

## 2011-09-05 NOTE — Progress Notes (Signed)
Physical Therapy Treatment Patient Details Name: [name] MRN: [id number] DOB: [birth year] Today's Date: 09/05/2011  PT Assessment/Plan  PT - Assessment/Plan PT Plan: Discharge plan remains appropriate PT Frequency: Min 3X/week Recommendations for Other Services: OT consult Follow Up Recommendations: Skilled nursing facility Equipment Recommended: Defer to next venue PT Goals  Acute Rehab PT Goals PT Goal Formulation: With patient Time For Goal Achievement: 7 days Pt will go Supine/Side to Sit: with mod assist PT Goal: Supine/Side to Sit - Progress: Goal set today Pt will go Sit to Supine/Side: with mod assist PT Goal: Sit to Supine/Side - Progress: Goal set today Pt will go Sit to Stand: with mod assist PT Goal: Sit to Stand - Progress: Goal set today Pt will go Stand to Sit: with mod assist PT Goal: Stand to Sit - Progress: Goal set today Pt will Transfer Bed to Chair/Chair to Bed: with +2 total assist PT Transfer Goal: Bed to Chair/Chair to Bed - Progress: Goal set today  PT Treatment Precautions/Restrictions  Precautions Precautions: Posterior Hip Restrictions Weight Bearing Restrictions: Yes RLE Weight Bearing: Touchdown weight bearing Mobility (including Balance) Bed Mobility Bed Mobility: Yes Supine to Sit: 1: +2 Total assist Supine to Sit Details (indicate cue type and reason): cues for sequence and to self assist with L LE and UEs - utilized draw sheet to assist - pt 25% Sit to Supine: 1: +2 Total assist Sit to Supine - Details (indicate cue type and reason): cues for sequence - pt 40% with increased time Transfers Transfers: Yes Sit to Stand: 1: +2 Total assist;With upper extremity assist;From chair/3-in-1 Sit to Stand Details (indicate cue type and reason): cues for use of UEs and for LE position - pt 50%  Stand to Sit: 1: +2 Total assist;To bed;With upper extremity assist Stand to Sit Details: cues for use of UEs and for LE position - pt 60% Stand Pivot  Transfers: 1: +2 Total assist Stand Pivot Transfer Details (indicate cue type and reason): pt 60% with RW and cues for sequence, increased WB on UEs and tdwb on R LE Ambulation/Gait Ambulation/Gait:  (Pt unable to comply with TDWB and advance R LE)    Exercise    End of Session PT - End of Session Activity Tolerance: Patient tolerated treatment well Patient left: in bed;with call bell in reach;with family/visitor present Nurse Communication: Mobility status for transfers;Mobility status for ambulation General Behavior During Session: [facility] for tasks performed Cognition: [facility] for tasks performed  Kyle Stansell 09/05/2011, 5:37 PM

## 2011-09-05 NOTE — Progress Notes (Signed)
Subjective: No new complaints Anxious to go to SNF tomorrow, creatinine is a little worse today  Antibiotics:  Anti-infectives     Start     Dose/Rate Route Frequency Ordered Stop   09/04/11 1800   vancomycin (VANCOCIN) 1,750 mg in sodium chloride 0.9 % 500 mL IVPB  Status:  Discontinued        1,750 mg 250 mL/hr over 120 Minutes Intravenous Every 24 hours 09/03/11 1705 09/03/11 1706   09/04/11 1800   vancomycin (VANCOCIN) 1,500 mg in sodium chloride 0.9 % 500 mL IVPB        1,500 mg 250 mL/hr over 120 Minutes Intravenous Every 24 hours 09/03/11 1706     09/03/11 1715   vancomycin (VANCOCIN) 2,000 mg in sodium chloride 0.9 % 500 mL IVPB        2,000 mg 250 mL/hr over 120 Minutes Intravenous  Once 09/03/11 1705 09/03/11 2054   09/03/11 0905   Vancomycin HCl POWD  Status:  Discontinued          As needed 09/03/11 0906 09/03/11 1053   09/03/11 0902   Tobramycin Sulfate POWD  Status:  Discontinued          As needed 09/03/11 0905 09/03/11 1053   09/03/11 0815   tobramycin (NEBCIN) powder 2.4 g  Status:  Discontinued        2.4 g Topical To Surgery 09/03/11 0806 09/03/11 1602   09/03/11 0545   ceFAZolin (ANCEF) IVPB 2 g/50 mL premix        2 g 100 mL/hr over 30 Minutes Intravenous  Once 09/03/11 0538 09/03/11 0801   08/31/11 1715   ceFAZolin (ANCEF) IVPB 1 g/50 mL premix  Status:  Discontinued        1 g 100 mL/hr over 30 Minutes Intravenous 60 min pre-op 08/31/11 1714 08/31/11 1717          Medications: Scheduled Meds:   . allopurinol  300 mg Oral Daily  . colchicine  0.6 mg Oral Daily  . diltiazem  300 mg Oral QAC breakfast  . docusate sodium  100 mg Oral BID  . doxercalciferol  0.5 mcg Oral QAC breakfast  . ferrous sulfate  325 mg Oral TID PC  . gabapentin  300 mg Oral TID  . HYDROcodone-acetaminophen  1-2 tablet Oral Q4H  . metoprolol succinate  100 mg Oral QAC breakfast  . polyethylene glycol  17 g Oral BID  . potassium chloride SA  20 mEq Oral Daily  .  rivaroxaban  10 mg Oral Q24H  . sertraline  50 mg Oral QAC breakfast  . sotalol  80 mg Oral BID  . vancomycin  1,500 mg Intravenous Q24H  . DISCONTD: furosemide  160 mg Oral Q breakfast  . DISCONTD: furosemide  80 mg Oral q1800   Continuous Infusions:   . lactated ringers    . sodium chloride 0.9 % 1,000 mL with potassium chloride 10 mEq infusion 100 mL/hr (09/05/11 1045)   PRN Meds:.acetaminophen, acetaminophen, alum & mag hydroxide-simeth, bisacodyl, diphenhydrAMINE, HYDROmorphone, menthol-cetylpyridinium, methocarbamol(ROBAXIN) IV, methocarbamol, metoCLOPramide (REGLAN) injection, metoCLOPramide, ondansetron (ZOFRAN) IV, ondansetron, phenol, sodium chloride, zolpidem   Objective: Weight change:   Intake/Output Summary (Last 24 hours) at 09/05/11 1418 Last data filed at 09/05/11 1304  Gross per 24 hour  Intake 2366.67 ml  Output   1800 ml  Net 566.67 ml   Blood pressure 109/69, pulse 94, temperature 97.8 F (36.6 C), temperature source Oral, resp. rate 16, height 5\' 5"  (1.651  m), weight 260 lb (117.935 kg), SpO2 98.00%. Temp:  [97.5 F (36.4 C)-98 F (36.7 C)] 97.8 F (36.6 C) (02/20 0530) Pulse Rate:  [84-94] 94  (02/20 0900) Resp:  [14-16] 16  (02/20 0530) BP: (75-109)/(45-71) 109/69 mmHg (02/20 0900) SpO2:  [92 %-98 %] 98 % (02/20 0530)  Physical Exam: General: Alert and awake, oriented x3, not in any acute distress.  HEENT: anicteric sclera, pupils reactive to light and accommodation, EOMI, oropharynx clear and without exudate  CVS regular rate, normal r, no murmur rubs or gallops  Chest: clear to auscultation bilaterally, no wheezing, rales or rhonchi  Abdomen: soft nontender, nondistended, normal bowel sounds,  Extremities: right hip with dressing Skin: no rashes  Neuro: nonfocal, strength and sensation intact   Lab Results:  Basename 09/05/11 0410 09/04/11 0355  WBC 13.2* 20.1*  HGB 9.7* 8.2*  HCT 29.5* 25.0*  PLT 254 320    BMET  Basename 09/05/11  0410 09/04/11 0355  NA 131* 135  K 4.4 4.8  CL 99 102  CO2 25 28  GLUCOSE 87 99  BUN 41* 37*  CREATININE 2.46* 2.23*  CALCIUM 8.5 8.3*    Micro Results: Recent Results (from the past 240 hour(s))  SURGICAL PCR SCREEN     Status: Abnormal   Collection Time   08/28/11 10:33 AM      Component Value Range Status Comment   MRSA, PCR NEGATIVE  NEGATIVE  Final    Staphylococcus aureus POSITIVE (*) NEGATIVE  Final   GRAM STAIN     Status: Normal   Collection Time   09/03/11  8:08 AM      Component Value Range Status Comment   Specimen Description FLUID SYNOVIAL RIGHT HIP ON SWAB   Final    Special Requests NONE   Final    Gram Stain     Final    Value: ABUNDANT WBC PRESENT, PREDOMINANTLY PMN     NO ORGANISMS SEEN     Gram Stain Report Called to,Read Back By and Verified With:     DAVENPORT,S. RN AT [id number] ON 09/03/11 BY GILLESPIE,B.   Report Status 09/03/2011 FINAL   Final   BODY FLUID CULTURE     Status: Normal (Preliminary result)   Collection Time   09/03/11  8:09 AM      Component Value Range Status Comment   Specimen Description FLUID SYNOVIAL RIGHT HIP ON SWAB   Final    Special Requests NONE   Final    Gram Stain     Final    Value: ABUNDANT WBC PRESENT, PREDOMINANTLY PMN     NO ORGANISMS SEEN     Gram Stain Report Called to,Read Back By and Verified With: Gram Stain Report Called to,Read Back By and Verified With: DAVENPORT S RN @ [phone] ON 09/03/11 BY Ethel Rana B Performed by [facility]   Culture     Final    Value: RARE PSEUDOMONAS AERUGINOSA     Note: CRITICAL RESULT CALLED TO, READ BACK BY AND VERIFIED WITH: KIMBERLY OWENS   Report Status PENDING   Incomplete   ANAEROBIC CULTURE     Status: Normal (Preliminary result)   Collection Time   09/03/11  8:10 AM      Component Value Range Status Comment   Specimen Description FLUID SYNOVIAL RIGHT HIP   Final    Special Requests NONE   Final    Gram Stain     Final    Value: NO WBC SEEN  NO SQUAMOUS EPITHELIAL  CELLS SEEN     NO ORGANISMS SEEN   Culture     Final    Value: NO ANAEROBES ISOLATED; CULTURE IN PROGRESS FOR 5 DAYS   Report Status PENDING   Incomplete     Studies/Results: Dg Chest Port 1 View  09/04/2011  *RADIOLOGY REPORT*  Clinical Data: For central line placement  PORTABLE CHEST - 1 VIEW  Comparison: Portable chest x-ray of 11/21 for a 1012  Findings: The right upper extremity PICC line tip is in the mid lower SVC.  No pneumothorax is seen.  Cardiomegaly is stable.  A lap band is present.  IMPRESSION: Right PICC line tip in mid lower SVC.  No pneumothorax.  Original Report Authenticated By: Joretta Bachelor, M.D.      Assessment/Plan: DAIRIN REBURN is a 66 y.o. female with CKD, atrial fibrillation, HTN and THA infection sp I and D and washout with removal of all prosthetic components. 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 Alvan Dame and myself  --she will need close weekly labs for cbc and bmp  faxed to me at [facility] at fax [phone]  -- I have made her appt with me on 10/17/11 at 22 in [facility] and I have put into DC manager and given her my card  2) CKD: Spoke with Dr. Jimmy Footman re her CKD and my concern to not hurt her renal fxn further with vancomycin, he agrees with stopping lasix for now to err on side of her being fluid positive Serum Cr is worse today --would consider giving her bolus of NS  3) Screening: HIV negative     LOS: 2 days   Rhina Brackett Dam 09/05/2011, 2:18 PM

## 2011-09-05 NOTE — Progress Notes (Signed)
Subjective: 2 Days Post-Op Procedure(s) (LRB): EXCISIONAL TOTAL HIP ARTHROPLASTY WITH ANTIBIOTIC SPACERS (Right)   Patient reports pain as mild. Feels better after receiving blood. No events.  Objective:   VITALS:   Filed Vitals:   09/05/11 0900  BP: 109/69  Pulse: 94  Temp:   Resp:     Neurovascular intact Incision: dressing C/D/I  LABS  Basename 09/05/11 0410 09/04/11 0355 09/03/11 1111  HGB 9.7* 8.2* 10.3*  HCT 29.5* 25.0* 31.0*  WBC 13.2* 20.1* 18.4*  PLT 254 320 361     Basename 09/05/11 0410 09/04/11 0355 09/03/11 1111  NA 131* 135 135  K 4.4 4.8 3.9  BUN 41* 37* 33*  CREATININE 2.46* 2.23* 1.88*  GLUCOSE 87 99 127*     Assessment/Plan: 2 Days Post-Op Procedure(s) (LRB): EXCISIONAL TOTAL HIP ARTHROPLASTY WITH ANTIBIOTIC SPACERS (Right)   HV drain d/c'ed Up with therapy Discharge to SNF upon discharge   [place] Pugh. Colden Samaras   PAC  09/05/2011, 9:41 AM

## 2011-09-05 NOTE — Evaluation (Signed)
Physical Therapy Evaluation Patient Details Name: [name] MRN: [id number] DOB: April 25, 1946 Today's Date: 09/05/2011  Problem List:  Patient Active Problem List  Diagnoses  . TINEA CORPORIS  . MORBID OBESITY  . DEPRESSION  . WEAKNESS, LEFT SIDE OF BODY  . HYPERTENSION  . ATRIAL FIBRILLATION  . CVA  . ASTHMA  . RENAL FAILURE, ACUTE  . SEBACEOUS CYST, BREAST  . FOLLICULITIS  . NAUSEA WITH VOMITING  . ABDOMINAL PAIN, UNSPECIFIED SITE  . IATROGENIC CEREBROVASCULAR INFARCT/HEMORRHAGE NE  . ARRHYTHMIA, HX OF  . SINUSITIS- ACUTE-NOS  . Low back pain radiating to right leg  . Anemia  . Hypokalemia  . ARF (acute renal failure)  . Leukocytosis, unspecified  . Clostridium difficile colitis  . Traumatic seroma of thigh  . 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 Acute blood loss anemia    Past Medical History:  Past Medical History  Diagnosis Date  . Hypertension   . Depression   . Fractures, stress     in both feet  . Kidney disease     pt states creatinine is high  . 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 Stroke   . Gout   . Asthma     MILD NOW-NO INHALERS  . Sleep apnea     USES CPAP  . Anemia   . Blood transfusion   . Arthritis     HIP  . Atrial fibrillation     PT TAKES ASPIRIN AS BLOOD THINNER-HX OF CEREBRAL BLEED WHILE ON COUMADIN  . Arrhythmia   . Cardiomyopathy    Past Surgical History:  Past Surgical History  Procedure Date  . Cholecystectomy   . 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 Joint replacement     right hip and right femur x 2  . Right femur fracture x2     FRACTURES WERE AFTER AFTER RIGHT HIP REPLACEMENT    PT Assessment/Plan/Recommendation PT Assessment Clinical Impression Statement: PT with R THR resection presents with decreased R LE ROM/strength and very limited functional mobility complicated by obesity and TDWB status on R LE. PT Recommendation/Assessment: Patient will need skilled PT  in the acute care venue PT Problem List: Decreased strength;Decreased range of motion;Decreased activity tolerance;Decreased balance;Decreased mobility;Decreased knowledge of use of DME;Pain;Obesity PT Therapy Diagnosis : Difficulty walking PT Plan PT Frequency: Min 3X/week PT Treatment/Interventions: Gait training;DME instruction;Functional mobility training;Therapeutic activities;Therapeutic exercise;Patient/family education PT Recommendation Recommendations for Other Services: OT consult Follow Up Recommendations: Skilled nursing facility Equipment Recommended: Defer to next venue PT Goals  Acute Rehab PT Goals PT Goal Formulation: With patient Time For Goal Achievement: 7 days Pt will go Supine/Side to Sit: with mod assist PT Goal: Supine/Side to Sit - Progress: Goal set today Pt will go Sit to Supine/Side: with mod assist PT Goal: Sit to Supine/Side - Progress: Goal set today Pt will go Sit to Stand: with mod assist PT Goal: Sit to Stand - Progress: Goal set today Pt will go Stand to Sit: with mod assist PT Goal: Stand to Sit - Progress: Goal set today Pt will Transfer Bed to Chair/Chair to Bed: with +2 total assist PT Transfer Goal: Bed to Chair/Chair to Bed - Progress: Goal set today  PT Evaluation Precautions/Restrictions  Precautions Precautions: Posterior Hip Restrictions RLE Weight Bearing: Touchdown weight bearing Prior Functioning  Home Living Lives With: Spouse Prior Function Level of Independence: Needs assistance with ADLs;Needs assistance with homemaking;Needs assistance  with gait (ambulation with RW) Cognition Cognition Arousal/Alertness: Awake/alert Overall Cognitive Status: Appears within functional limits for tasks assessed Orientation Level: Oriented X4 Sensation/Coordination Coordination Gross Motor Movements are Fluid and Coordinated: Yes Extremity Assessment RUE Assessment RUE Assessment: Within Functional Limits LUE Assessment LUE Assessment:  Within Functional Limits RLE Assessment RLE Assessment: Exceptions to [facility] (no ROM per physicians order) LLE Assessment LLE Assessment: Exceptions to [facility] (4/5 strength; knee flex 95) Mobility (including Balance) Bed Mobility Bed Mobility: Yes Supine to Sit: 1: +2 Total assist Supine to Sit Details (indicate cue type and reason): cues for sequence and to self assist with L LE and UEs - utilized draw sheet to assist - pt 25% Transfers Transfers: Yes Sit to Stand: 1: +2 Total assist;With upper extremity assist;From bed;From elevated surface Sit to Stand Details (indicate cue type and reason): cues for LE position and use of UEs, utilzed bed to assist pt up - pt 50% from elevated bed Stand to Sit: With upper extremity assist;To chair/3-in-1;With armrests;1: +2 Total assist Stand to Sit Details: cues for use of UEs and for LE position - pt 50% Stand Pivot Transfers: 1: +2 Total assist Stand Pivot Transfer Details (indicate cue type and reason): pt 60% with RW and cues for sequence, increased WB on UEs and tdwb on R LE Ambulation/Gait Ambulation/Gait:  (Pt unable to comply with TDWB and advance R LE)    Exercise    End of Session PT - End of Session Activity Tolerance: Patient tolerated treatment well Patient left: in chair;with call bell in reach;with family/visitor present Nurse Communication: Mobility status for transfers;Mobility status for ambulation General Behavior During Session: [facility] for tasks performed Cognition: [facility] for tasks performed  Sherral Dirocco 09/05/2011, 5:30 PM

## 2011-09-05 NOTE — Progress Notes (Signed)
MD made aware of CRP level 11.92.  No new orders at this time.

## 2011-09-05 NOTE — Progress Notes (Signed)
ANTIBIOTIC CONSULT NOTE - INITIAL  Pharmacy Consult for Ceftazidime Indication: Synovial fluid culture/Pseudomonas  Allergies  Allergen Reactions  . Coumadin Other (See Comments)    Caused her to have a stroke    Patient Measurements: Height: 5\' 5"  (165.1 cm) Weight: 260 lb (117.935 kg) IBW/kg (Calculated) : 57   Vital Signs: Temp: 97.8 F (36.6 C) (02/20 0530) Temp src: Oral (02/20 0530) BP: 109/69 mmHg (02/20 0900) Pulse Rate: 94  (02/20 0900) Intake/Output from previous day: 02/19 0701 - 02/20 0700 In: 3132.5 [P.O.:720; I.V.:1765; Blood:647.5] Out: 1215 [Urine:910; Drains:305] Intake/Output from this shift: Total I/O In: 360 [P.O.:360] Out: 675 [Urine:675]  Labs:  [facility] 09/05/11 0410 09/04/11 0355 09/03/11 1111  WBC 13.2* 20.1* 18.4*  HGB 9.7* 8.2* 10.3*  PLT 254 320 361  LABCREA -- -- --  CREATININE 2.46* 2.23* 1.88*   Estimated Creatinine Clearance: 30.1 ml/min (by C-G formula based on Cr of 2.46). No results found for this basename: VANCOTROUGH:2,VANCOPEAK:2,VANCORANDOM:2,GENTTROUGH:2,GENTPEAK:2,GENTRANDOM:2,TOBRATROUGH:2,TOBRAPEAK:2,TOBRARND:2,AMIKACINPEAK:2,AMIKACINTROU:2,AMIKACIN:2, in the last 72 hours   Microbiology: Recent Results (from the past 720 hour(s))  SURGICAL PCR SCREEN     Status: Abnormal   Collection Time   08/28/11 10:33 AM      Component Value Range Status Comment   MRSA, PCR NEGATIVE  NEGATIVE  Final    Staphylococcus aureus POSITIVE (*) NEGATIVE  Final   GRAM STAIN     Status: Normal   Collection Time   09/03/11  8:08 AM      Component Value Range Status Comment   Specimen Description FLUID SYNOVIAL RIGHT HIP ON SWAB   Final    Special Requests NONE   Final    Gram Stain     Final    Value: ABUNDANT WBC PRESENT, PREDOMINANTLY PMN     NO ORGANISMS SEEN     Gram Stain Report Called to,Read Back By and Verified With:     DAVENPORT,S. RN AT [id number] ON 09/03/11 BY GILLESPIE,B.   Report Status 09/03/2011 FINAL   Final   BODY FLUID  CULTURE     Status: Normal (Preliminary result)   Collection Time   09/03/11  8:09 AM      Component Value Range Status Comment   Specimen Description FLUID SYNOVIAL RIGHT HIP ON SWAB   Final    Special Requests NONE   Final    Gram Stain     Final    Value: ABUNDANT WBC PRESENT, PREDOMINANTLY PMN     NO ORGANISMS SEEN     Gram Stain Report Called to,Read Back By and Verified With: Gram Stain Report Called to,Read Back By and Verified With: DAVENPORT S RN @ [phone] ON 09/03/11 BY Ethel Rana B Performed by [facility]   Culture     Final    Value: RARE PSEUDOMONAS AERUGINOSA     Note: CRITICAL RESULT CALLED TO, READ BACK BY AND VERIFIED WITH: KIMBERLY OWENS   Report Status PENDING   Incomplete   ANAEROBIC CULTURE     Status: Normal (Preliminary result)   Collection Time   09/03/11  8:10 AM      Component Value Range Status Comment   Specimen Description FLUID SYNOVIAL RIGHT HIP   Final    Special Requests NONE   Final    Gram Stain     Final    Value: NO WBC SEEN     NO SQUAMOUS EPITHELIAL CELLS SEEN     NO ORGANISMS SEEN   Culture     Final  Value: NO ANAEROBES ISOLATED; CULTURE IN PROGRESS FOR 5 DAYS   Report Status PENDING   Incomplete     Medical History: Past Medical History  Diagnosis Date  . Hypertension   . Depression   . Fractures, stress     in both feet  . Kidney disease     pt states creatinine is high  . 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 Stroke   . Gout   . Asthma     MILD NOW-NO INHALERS  . Sleep apnea     USES CPAP  . Anemia   . Blood transfusion   . Arthritis     HIP  . Atrial fibrillation     PT TAKES ASPIRIN AS BLOOD THINNER-HX OF CEREBRAL BLEED WHILE ON COUMADIN  . Arrhythmia   . Cardiomyopathy     Medications:  Anti-infectives     Start     Dose/Rate Route Frequency Ordered Stop   09/05/11 1600   cefTAZidime (FORTAZ) 2 g in dextrose 5 % 50 mL IVPB        2 g 100 mL/hr over 30 Minutes Intravenous Every 24 hours 09/05/11  1442     09/04/11 1800   vancomycin (VANCOCIN) 1,750 mg in sodium chloride 0.9 % 500 mL IVPB  Status:  Discontinued        1,750 mg 250 mL/hr over 120 Minutes Intravenous Every 24 hours 09/03/11 1705 09/03/11 1706   09/04/11 1800   vancomycin (VANCOCIN) 1,500 mg in sodium chloride 0.9 % 500 mL IVPB  Status:  Discontinued        1,500 mg 250 mL/hr over 120 Minutes Intravenous Every 24 hours 09/03/11 1706 09/05/11 1424   09/03/11 1715   vancomycin (VANCOCIN) 2,000 mg in sodium chloride 0.9 % 500 mL IVPB        2,000 mg 250 mL/hr over 120 Minutes Intravenous  Once 09/03/11 1705 09/03/11 2054   09/03/11 0905   Vancomycin HCl POWD  Status:  Discontinued          As needed 09/03/11 0906 09/03/11 1053   09/03/11 0902   Tobramycin Sulfate POWD  Status:  Discontinued          As needed 09/03/11 0905 09/03/11 1053   09/03/11 0815   tobramycin (NEBCIN) powder 2.4 g  Status:  Discontinued        2.4 g Topical To Surgery 09/03/11 0806 09/03/11 1602   09/03/11 0545   ceFAZolin (ANCEF) IVPB 2 g/50 mL premix        2 g 100 mL/hr over 30 Minutes Intravenous  Once 09/03/11 0538 09/03/11 0801   08/31/11 1715   ceFAZolin (ANCEF) IVPB 1 g/50 mL premix  Status:  Discontinued        1 g 100 mL/hr over 30 Minutes Intravenous 60 min pre-op 08/31/11 1714 08/31/11 1717         Assessment:  66 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   Followed by ID MD.  Renal function worsening, clearance 27 ml/min (normalized), 30 ml/min (Cockcroft-Gault)  Clearance at cut-off for daily vs q12 hr dosing.  With SCr increasing, will begin dosing q24 hr. Can consider q12 hr dosing if/when renal function improves.   Goal of Therapy:  Antibiotics appropriate for culture and renal function.  Plan:  Ceftazidime 2 gm q24 hr. Planning d/c to SNF tomorrow.  Minda Ditto PharmD Pager [phone] 09/05/2011,2:42 PM

## 2011-09-06 LAB — BASIC METABOLIC PANEL
BUN: 44 mg/dL — ABNORMAL HIGH (ref 6–23)
CO2: 25 mEq/L (ref 19–32)
Calcium: 10.7 mg/dL — ABNORMAL HIGH (ref 8.4–10.5)
Chloride: 99 mEq/L (ref 96–112)
Creatinine, Ser: 2.34 mg/dL — ABNORMAL HIGH (ref 0.50–1.10)
GFR calc Af Amer: 24 mL/min — ABNORMAL LOW (ref >90)
GFR calc non Af Amer: 21 mL/min — ABNORMAL LOW (ref >90)
Glucose, Bld: 109 mg/dL — ABNORMAL HIGH (ref 70–99)
Potassium: 4.1 mEq/L (ref 3.5–5.1)
Sodium: 130 mEq/L — ABNORMAL LOW (ref 135–145)

## 2011-09-06 LAB — BODY FLUID CULTURE

## 2011-09-06 MED ORDER — POLYETHYLENE GLYCOL 3350 17 G PO PACK: 17.0000 g | PACK | Freq: Two times a day (BID) | ORAL | Status: AC

## 2011-09-06 MED ORDER — DEXTROSE 5 % IV SOLN
2.0000 g | Freq: Two times a day (BID) | INTRAVENOUS | Status: DC
  Filled 2011-09-06 (×2): qty 2

## 2011-09-06 MED ORDER — RIVAROXABAN 10 MG PO TABS: 10.0000 mg | ORAL_TABLET | Freq: Every day | ORAL | Status: DC

## 2011-09-06 MED ORDER — DSS 100 MG PO CAPS: 100.0000 mg | ORAL_CAPSULE | Freq: Two times a day (BID) | ORAL | Status: AC

## 2011-09-06 MED ORDER — SACCHAROMYCES BOULARDII 250 MG PO CAPS: 250.0000 mg | ORAL_CAPSULE | Freq: Two times a day (BID) | ORAL | Status: AC

## 2011-09-06 MED ORDER — METHOCARBAMOL 500 MG PO TABS: 500.0000 mg | ORAL_TABLET | Freq: Four times a day (QID) | ORAL | Status: AC | PRN

## 2011-09-06 MED ORDER — ASPIRIN 325 MG PO TABS: 325.0000 mg | ORAL_TABLET | Freq: Two times a day (BID) | ORAL | Status: DC

## 2011-09-06 MED ORDER — HYDROCODONE-ACETAMINOPHEN 7.5-325 MG PO TABS: 1.0000 | ORAL_TABLET | ORAL | Status: AC

## 2011-09-06 MED ORDER — SACCHAROMYCES BOULARDII 250 MG PO CAPS
250.0000 mg | ORAL_CAPSULE | Freq: Two times a day (BID) | ORAL | Status: DC
  Filled 2011-09-06 (×2): qty 1

## 2011-09-06 NOTE — Plan of Care (Signed)
Problem: Phase I Progression Outcomes Goal: [place] evening of surgery Outcome: Not Met (add Reason) Patient has cement spacer.

## 2011-09-06 NOTE — Progress Notes (Signed)
ANTIBIOTIC CONSULT NOTE - FOLLOW UP  Pharmacy Consult for Ceftazidime Indication: Prosthetic THA Pseudomonas infection  Allergies  Allergen Reactions  . Coumadin Other (See Comments)    Caused her to have a stroke    Patient Measurements: Height: 5\' 5"  (165.1 cm) Weight: 260 lb (117.935 kg) IBW/kg (Calculated) : 57    Vital Signs: Temp: 98.1 F (36.7 C) (02/21 0444) Temp src: Oral (02/21 0444) BP: 118/63 mmHg (02/21 0915) Pulse Rate: 106  (02/21 0915) Intake/Output from previous day: 02/20 0701 - 02/21 0700 In: 2058.3 [P.O.:660; I.V.:1398.3] Out: 925 [Urine:925] Intake/Output from this shift: Total I/O In: 360 [P.O.:360] Out: 250 [Urine:250]  Labs:  [facility] 09/06/11 0810 09/05/11 0410 09/04/11 0355 09/03/11 1111  WBC -- 13.2* 20.1* 18.4*  HGB -- 9.7* 8.2* 10.3*  PLT -- 254 320 361  LABCREA -- -- -- --  CREATININE 2.34* 2.46* 2.23* --   Estimated Creatinine Clearance: 31.6 ml/min (by C-G formula based on Cr of 2.34).   Microbiology: Recent Results (from the past 720 hour(s))  SURGICAL PCR SCREEN     Status: Abnormal   Collection Time   08/28/11 10:33 AM      Component Value Range Status Comment   MRSA, PCR NEGATIVE  NEGATIVE  Final    Staphylococcus aureus POSITIVE (*) NEGATIVE  Final   GRAM STAIN     Status: Normal   Collection Time   09/03/11  8:08 AM      Component Value Range Status Comment   Specimen Description FLUID SYNOVIAL RIGHT HIP ON SWAB   Final    Special Requests NONE   Final    Gram Stain     Final    Value: ABUNDANT WBC PRESENT, PREDOMINANTLY PMN     NO ORGANISMS SEEN     Gram Stain Report Called to,Read Back By and Verified With:     DAVENPORT,S. RN AT [id number] ON 09/03/11 BY GILLESPIE,B.   Report Status 09/03/2011 FINAL   Final   BODY FLUID CULTURE     Status: Normal   Collection Time   09/03/11  8:09 AM      Component Value Range Status Comment   Specimen Description FLUID SYNOVIAL RIGHT HIP ON SWAB   Final    Special Requests NONE    Final    Gram Stain     Final    Value: ABUNDANT WBC PRESENT, PREDOMINANTLY PMN     NO ORGANISMS SEEN     Gram Stain Report Called to,Read Back By and Verified With: Gram Stain Report Called to,Read Back By and Verified With: DAVENPORT S RN @ [phone] ON 09/03/11 BY Ethel Rana B Performed by [facility]   Culture     Final    Value: RARE PSEUDOMONAS AERUGINOSA     Note: CRITICAL RESULT CALLED TO, READ BACK BY AND VERIFIED WITH: KIMBERLY OWENS   Report Status 09/06/2011 FINAL   Final    Organism ID, Bacteria PSEUDOMONAS AERUGINOSA   Final   ANAEROBIC CULTURE     Status: Normal (Preliminary result)   Collection Time   09/03/11  8:10 AM      Component Value Range Status Comment   Specimen Description FLUID SYNOVIAL RIGHT HIP   Final    Special Requests NONE   Final    Gram Stain     Final    Value: NO WBC SEEN     NO SQUAMOUS EPITHELIAL CELLS SEEN     NO ORGANISMS SEEN   Culture  Final    Value: NO ANAEROBES ISOLATED; CULTURE IN PROGRESS FOR 5 DAYS   Report Status PENDING   Incomplete    Anti-infectives: 2/18 >> Ancef >> 2/18 2/18 >> Vanc >>2/20 2/20>>Ceftazidime 2g q24h>>   Assessment:  66 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  Per ID MD request, will dose more aggressively due to nature of infection and since Scr has slightly improved today 2.34 <-- 2.46   Goal of Therapy:  Eradication of infection  Plan:   Increase ceftazidime to 2gm IV q12h  Monitor Scr daily while inpatient  Recommend monitoring Scr three times weekly after discharge to SNF and adjust interval to q24h if CrCl falls below 30 ml/min  Peggyann Juba, PharmD, BCPS Pager: [phone] 09/06/2011,9:44 AM

## 2011-09-06 NOTE — Plan of Care (Signed)
Problem: Phase III Progression Outcomes Goal: Ambulate BID Outcome: Not Met (add Reason) NWB right hip

## 2011-09-06 NOTE — Evaluation (Signed)
Occupational Therapy Evaluation Patient Details Name: [name] MRN: [id number] DOB: 10/25/1945 Today's Date: 09/06/2011  Problem List:  Patient Active Problem List  Diagnoses  . TINEA CORPORIS  . MORBID OBESITY  . DEPRESSION  . WEAKNESS, LEFT SIDE OF BODY  . HYPERTENSION  . ATRIAL FIBRILLATION  . CVA  . ASTHMA  . RENAL FAILURE, ACUTE  . SEBACEOUS CYST, BREAST  . FOLLICULITIS  . NAUSEA WITH VOMITING  . ABDOMINAL PAIN, UNSPECIFIED SITE  . IATROGENIC CEREBROVASCULAR INFARCT/HEMORRHAGE NE  . ARRHYTHMIA, HX OF  . SINUSITIS- ACUTE-NOS  . Low back pain radiating to right leg  . Anemia  . Hypokalemia  . ARF (acute renal failure)  . Leukocytosis, unspecified  . Clostridium difficile colitis  . Traumatic seroma of thigh  . 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 Acute blood loss anemia    Past Medical History:  Past Medical History  Diagnosis Date  . Hypertension   . Depression   . Fractures, stress     in both feet  . Kidney disease     pt states creatinine is high  . 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 Stroke   . Gout   . Asthma     MILD NOW-NO INHALERS  . Sleep apnea     USES CPAP  . Anemia   . Blood transfusion   . Arthritis     HIP  . Atrial fibrillation     PT TAKES ASPIRIN AS BLOOD THINNER-HX OF CEREBRAL BLEED WHILE ON COUMADIN  . Arrhythmia   . Cardiomyopathy    Past Surgical History:  Past Surgical History  Procedure Date  . Cholecystectomy   . 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 Joint replacement     right hip and right femur x 2  . Right femur fracture x2     FRACTURES WERE AFTER AFTER RIGHT HIP REPLACEMENT    OT Assessment/Plan/Recommendation OT Assessment Clinical Impression Statement: Pt is a 66 yo female who presents w/absence of R hip with antibiotic pacer. Pt limited by obesity and limited activity tolerance. Skilled OT recommended to maximize I w/BADLs to mod A level in prep for d/c  to next venue of care. OT Recommendation/Assessment: Patient will need skilled OT in the acute care venue OT Problem List: Decreased activity tolerance;Decreased safety awareness;Decreased knowledge of use of DME or AE;Obesity Barriers to Discharge: Inaccessible home environment;Decreased caregiver support OT Therapy Diagnosis : Generalized weakness OT Plan OT Frequency: Min 1X/week OT Treatment/Interventions: Self-care/ADL training;Therapeutic activities;DME and/or AE instruction;Patient/family education OT Recommendation Follow Up Recommendations: Skilled nursing facility Equipment Recommended: Defer to next venue Individuals Consulted Consulted and Agree with Results and Recommendations: Patient OT Goals Acute Rehab OT Goals OT Goal Formulation: With patient Time For Goal Achievement: 2 weeks ADL Goals Pt Will Perform Grooming: with supervision;Sitting, edge of bed (X 3 tasks to improve activtiy tolerance.) ADL Goal: Grooming - Progress: Goal set today Pt Will Transfer to Toilet: with mod assist;Stand pivot transfer;Squat pivot transfer;Extra wide 3-in-1 ADL Goal: Toilet Transfer - Progress: Goal set today Pt Will Perform Toileting - Clothing Manipulation: with mod assist;Standing ADL Goal: Toileting - Clothing Manipulation - Progress: Goal set today Pt Will Perform Toileting - Hygiene: with mod assist;Sit to stand from 3-in-1/toilet ADL Goal: Toileting - Hygiene - Progress: Goal set today  OT Evaluation Precautions/Restrictions  Precautions Precautions: Posterior Hip Restrictions Weight Bearing Restrictions: Yes RLE Weight Bearing: Touchdown weight  bearing Prior Functioning Home Living Lives With: Spouse Additional Comments: Home layout questions not asked due to pt d/cing to [facility]. Prior Function Level of Independence: Needs assistance with ADLs;Needs assistance with homemaking;Needs assistance with gait;Needs assistance with tranfers ADL ADL Grooming:  Performed;Wash/dry face;Set up Where Assessed - Grooming: Sitting, bed;Unsupported Upper Body Bathing: Simulated;Set up Where Assessed - Upper Body Bathing: Sitting, bed;Unsupported Lower Body Bathing: Simulated;+2 Total assistance;Comment for patient % (Pt 0%) Where Assessed - Lower Body Bathing: Sit to stand from bed Upper Body Dressing: Simulated;Set up Where Assessed - Upper Body Dressing: Sitting, bed;Unsupported Lower Body Dressing: Simulated;+2 Total assistance;Comment for patient % (Pt 0%) Where Assessed - Lower Body Dressing: Sit to stand from bed Toilet Transfer: Performed;+2 Total assistance;Comment for patient % (Pt 0%) Toilet Transfer Method: Stand pivot Science writer: Pharmacist, community - Clothing Manipulation: Performed;+2 Total assistance;Comment for patient % (Pt 0%) Where Assessed - Toileting Clothing Manipulation: Sit to stand from 3-in-1 or toilet Toileting - Hygiene: Performed;+2 Total assistance;Comment for patient % (Pt 0%) Where Assessed - Toileting Hygiene: Sit to stand from 3-in-1 or toilet Tub/Shower Transfer: Not assessed Tub/Shower Transfer Method: Not assessed Equipment Used: Rolling walker;Other (comment) (wide 3:1) ADL Comments: Pt fatigues very quickly and has difficulty maintaining TTWB status. Vision/Perception    Cognition Cognition Arousal/Alertness: Awake/alert Overall Cognitive Status: Appears within functional limits for tasks assessed Orientation Level: Oriented X4 Sensation/Coordination   Extremity Assessment RUE Assessment RUE Assessment: Within Functional Limits LUE Assessment LUE Assessment: Within Functional Limits Mobility  Bed Mobility Bed Mobility: Yes Supine to Sit: 1: +2 Total assist;With rails;HOB elevated (Comment degrees) Supine to Sit Details (indicate cue type and reason): cues for sequence and to self assist with L LE and UEs - utilized draw sheet to assist - pt 25% Transfers Sit to Stand: With upper  extremity assist;From bed;From chair/3-in-1;1: +2 Total assist;Patient percentage (comment) (Pt 40 %) Sit to Stand Details (indicate cue type and reason): pt=40  vc for pushing from chair Stand to Sit: 1: +2 Total assist;To chair/3-in-1;With upper extremity assist Stand to Sit Details: pt= 40 pt could not let go of RW to reach Exercises   End of Session OT - End of Session Equipment Utilized During Treatment: Gait belt Activity Tolerance: Patient limited by pain Patient left: in chair;with call bell in reach General Behavior During Session: [facility] for tasks performed Cognition: [facility] for tasks performed Co-session with PT  Megahn Killings A OTR/L [phone] 09/06/2011, 2:21 PM

## 2011-09-06 NOTE — Progress Notes (Signed)
Subjective: 3 Days Post-Op Procedure(s) (LRB): EXCISIONAL TOTAL HIP ARTHROPLASTY WITH ANTIBIOTIC SPACERS (Right)   Patient reports pain as mild. Feels better than prior to surgery.   Objective:   VITALS:   Filed Vitals:   09/06/11 0444  BP: 119/73  Pulse: 113  Temp: 98.1 F (36.7 C)  Resp: 18    Neurovascular intact Incision: dressing C/D/I  LABS  Basename 09/05/11 0410 09/04/11 0355 09/03/11 1111  HGB 9.7* 8.2* 10.3*  HCT 29.5* 25.0* 31.0*  WBC 13.2* 20.1* 18.4*  PLT 254 320 361     Basename 09/05/11 0410 09/04/11 0355 09/03/11 1111  NA 131* 135 135  K 4.4 4.8 3.9  BUN 41* 37* 33*  CREATININE 2.46* 2.23* 1.88*  GLUCOSE 87 99 127*     Assessment/Plan: 3 Days Post-Op Procedure(s) (LRB): EXCISIONAL TOTAL HIP ARTHROPLASTY WITH ANTIBIOTIC SPACERS (Right)   Up with therapy Will repeat BMET to monitor kidney function.   West Pugh Royce Sciara   PAC  09/06/2011, 7:49 AM

## 2011-09-06 NOTE — Discharge Summary (Signed)
**Note Jane-Identified via Obfuscation** Physician Discharge Summary  Patient ID: [name] MRN: [id number] DOB/AGE: 64/24/1947 64 y.o.  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 She has been utilizing a wound VAC for wound closure, but still has elevated sed rate and CRP. At this time, we plan to go in, check surrounding tissues. If there is any evidence of infection at this point, then subsequent removal of all hardware and prosthesis with placement of antibiotic beads and spacer, and then subsequent reimplantation once evidence of infection is gone. The surgery, risks, benefits, and aftercare were discussed in detail with the patient and her husband; questions invited and answered. Note that she is planning to go home after surgery. She will be on Xarelto 10 mg 1 daily for 2 weeks postoperatively and then aspirin 325 mg 1 b.i.[name]. for another 4 weeks.   PCP: Jane Koyanagi, DO, DO   Discharged Condition: good  Hospital Course:  Patient underwent the above stated procedure on 09/03/2011. Patient tolerated the procedure well and brought to the recovery room in good condition and subsequently to the floor.  POD #1 BP: 99/62 ; Pulse: 85 ; Temp: 98 F (36.7 C) ; Resp: 16  Patient reports pain as mild. Output much lower than input. Otherwise no events. Neurovascular  intact, dorsiflexion/plantar flexion intact, incision: dressing C/[name]/I, no cellulitis present and compartment soft. Initially placed on Vancomycin for treatment of infection. ID Consult.  LABS  Basename  09/04/11 0355   HGB  8.2*  HCT  25.0*    POD #2  BP: 109/69 ; Pulse: 94  Pt's foley cath and hemovac drain removed. IV was changed to a saline lock. Patient reports pain as mild. Feels better after receiving blood. No events.  Neurovascular intact and incision: dressing C/[name]/I Pt placed on Fortaz from ID and will also follow up with ID as well, will need facility to monitor BMP and adjust Fortaz accordingly.  LABS  Basename  09/05/11 0410   HGB  9.7*  HCT  29.5*   POD #3  BP: 119/73 ; Pulse: 113 ; Temp: 98.1 F (36.7 C) ; Resp: 18  Patient reports pain as mild. Feels better than prior to surgery.  Neurovascular intact and incision: dressing C/[name]/I  LABS   No new labs   Disposition: [name]     with follow up in 2 weeks  Follow-up Information    Follow up with [name] in 2 weeks.   Contact information:   [facility] [address] [place] [id number]          Discharge Orders    Future Appointments: Provider: Department: Dept Phone: Center:   10/24/2011 11:15 AM Jane Evener, MD [name] [phone] [facility]     Future Orders Please Complete By Expires   Diet - low sodium heart healthy      Call MD / Call 911  Comments:   If you experience chest pain or shortness of breath, CALL 911 and be transported to the hospital emergency room.  If you develope a fever above 101 F, pus (white drainage) or increased drainage or redness at the wound, or calf pain, call your surgeon's office.   Discharge instructions      Comments:   Daily dressing changes with gauze and tape. Keep the area dry and clean until follow up. Follow up in 2 weeks at [facility]. Call with any  questions or concerns.  Facility to monitor bmp 3 times a week and adjust [name] as needed.      Constipation Prevention      Comments:   Drink plenty of fluids.  Prune juice may be helpful.  You may use a stool softener, such as Colace (over the counter) 100 mg twice a day.  Use MiraLax (over the counter) for constipation as needed.   Increase activity slowly as tolerated      Weight Bearing as taught in Physical Therapy      Comments:   Use a walker or crutches as instructed.   Driving restrictions      Comments:   No driving for 4 weeks   Change dressing      Comments:   Daily dressing changes with 4x4 guaze and tape. Keep the area dry and clean.   TED hose      Comments:   Use stockings (TED hose) for 2 weeks on both leg(s).  You may remove them at night for sleeping.      Current Discharge Medication List    START taking these medications   Details  docusate sodium 100 MG CAPS Take 100 mg by mouth 2 (two) times daily.    polyethylene glycol (MIRALAX / GLYCOLAX) packet Take 17 g by mouth 2 (two) times daily. Qty: 14 each    rivaroxaban (XARELTO) 10 MG TABS tablet Take 1 tablet (10 mg total) by mouth daily. Qty: 14 tablet, Refills: 0    saccharomyces boulardii (FLORASTOR) 250 MG capsule Take 1 capsule (250 mg total) by mouth 2 (two) times daily.      CONTINUE these medications which have CHANGED   Details  aspirin 325 MG tablet Take 1 tablet (325 mg total) by mouth 2 (two) times daily.   Comments: Start after finishing Xarelto.    HYDROcodone-acetaminophen (NORCO) 7.5-325 MG per tablet Take 1-2 tablets by mouth every 4 (four) hours. Qty: 120 tablet, Refills: 0    methocarbamol (ROBAXIN) 500 MG tablet Take 1 tablet (500 mg total) by mouth every 6 (six) hours as needed (muscle spasms). Qty: 50 tablet, Refills: 0      CONTINUE these medications which have NOT CHANGED   Details  allopurinol (ZYLOPRIM) 100 MG tablet Take 300 mg by mouth daily.     colchicine 0.6  MG tablet Take 1 tablet (0.6 mg total) by mouth daily. Qty: 31 tablet, Refills: 0    diltiazem (CARDIZEM CD) 300 MG 24 hr capsule Take 300 mg by mouth daily before breakfast.     doxercalciferol (HECTOROL) 0.5 MCG capsule Take 0.5 mcg by mouth daily before breakfast.     ferrous sulfate 325 (65 FE) MG tablet Take 325 mg by mouth 3 (three) times daily with meals.     furosemide (LASIX) 80 MG tablet Take 80-160 mg by mouth 2 (two) times daily. 2 tab in am and 1 tab    gabapentin (NEURONTIN) 300 MG capsule Take 300 mg  by mouth 3 (three) times daily.     metoprolol (TOPROL XL) 100 MG 24 hr tablet Take 100 mg by mouth daily before breakfast.     Multiple Vitamins-Minerals (MULTIVITAMINS THER. W/MINERALS) TABS Take 1 tablet by mouth daily.     potassium chloride SA (KLOR-CON M20) 20 MEQ tablet Take 20 mEq by mouth daily.     sertraline (ZOLOFT) 50 MG tablet Take 1 tablet (50 mg total) by mouth daily before breakfast. Qty: 30 tablet, Refills: 0   Comments: Office visit due now for a 90 day supply    sotalol (BETAPACE) 80 MG tablet Take 80 mg by mouth 2 (two) times daily.     diphenhydrAMINE (BENADRYL) 25 MG tablet Take 25 mg by mouth every 6 (six) hours as needed. Itching/rash    nystatin (NYSTOP) 100000 UNIT/GM POWD Apply 1 g topically daily as needed. Rash         STOP taking these medications     acetaminophen (TYLENOL) 500 MG tablet Comments:  Reason for Stopping:         Pt will also be on:   Fortaz 2 gm IVPB q 12 hours    X 8 weeks  (Facility will monitor BMP 3 times a week and adjust dosage accordingly)  Follow-up Information    Follow up with Jane Evener, MD on 10/17/2011. (@ 11:15 in [address])    Contact information:   [address] [place] [phone]          Signed: West Pugh. Keandra Medero   PAC  09/06/2011, 12:52 PM

## 2011-09-06 NOTE — Progress Notes (Signed)
Subjective: No new complaints Pan sensitive pseudmonas in culture  Antibiotics:  Anti-infectives     Start     Dose/Rate Route Frequency Ordered Stop   09/06/11 1200   cefTAZidime (FORTAZ) 2 g in dextrose 5 % 50 mL IVPB        2 g 100 mL/hr over 30 Minutes Intravenous Every 12 hours 09/06/11 0955     09/05/11 1600   cefTAZidime (FORTAZ) 2 g in dextrose 5 % 50 mL IVPB  Status:  Discontinued        2 g 100 mL/hr over 30 Minutes Intravenous Every 24 hours 09/05/11 1442 09/06/11 0954   09/04/11 1800   vancomycin (VANCOCIN) 1,750 mg in sodium chloride 0.9 % 500 mL IVPB  Status:  Discontinued        1,750 mg 250 mL/hr over 120 Minutes Intravenous Every 24 hours 09/03/11 1705 09/03/11 1706   09/04/11 1800   vancomycin (VANCOCIN) 1,500 mg in sodium chloride 0.9 % 500 mL IVPB  Status:  Discontinued        1,500 mg 250 mL/hr over 120 Minutes Intravenous Every 24 hours 09/03/11 1706 09/05/11 1424   09/03/11 1715   vancomycin (VANCOCIN) 2,000 mg in sodium chloride 0.9 % 500 mL IVPB        2,000 mg 250 mL/hr over 120 Minutes Intravenous  Once 09/03/11 1705 09/03/11 2054   09/03/11 0905   Vancomycin HCl POWD  Status:  Discontinued          As needed 09/03/11 0906 09/03/11 1053   09/03/11 0902   Tobramycin Sulfate POWD  Status:  Discontinued          As needed 09/03/11 0905 09/03/11 1053   09/03/11 0815   tobramycin (NEBCIN) powder 2.4 g  Status:  Discontinued        2.4 g Topical To Surgery 09/03/11 0806 09/03/11 1602   09/03/11 0545   ceFAZolin (ANCEF) IVPB 2 g/50 mL premix        2 g 100 mL/hr over 30 Minutes Intravenous  Once 09/03/11 0538 09/03/11 0801   08/31/11 1715   ceFAZolin (ANCEF) IVPB 1 g/50 mL premix  Status:  Discontinued        1 g 100 mL/hr over 30 Minutes Intravenous 60 min pre-op 08/31/11 1714 08/31/11 1717          Medications: Scheduled Meds:    . allopurinol  300 mg Oral Daily  . cefTAZidime (FORTAZ)  IV  2 g Intravenous Q12H  . colchicine  0.6 mg Oral  Daily  . diltiazem  300 mg Oral QAC breakfast  . docusate sodium  100 mg Oral BID  . doxercalciferol  0.5 mcg Oral QAC breakfast  . ferrous sulfate  325 mg Oral TID PC  . gabapentin  300 mg Oral TID  . HYDROcodone-acetaminophen  1-2 tablet Oral Q4H  . metoprolol succinate  100 mg Oral QAC breakfast  . polyethylene glycol  17 g Oral BID  . potassium chloride SA  20 mEq Oral Daily  . rivaroxaban  10 mg Oral Q24H  . sertraline  50 mg Oral QAC breakfast  . sotalol  80 mg Oral BID  . DISCONTD: cefTAZidime (FORTAZ)  IV  2 g Intravenous Q24H  . DISCONTD: vancomycin  1,500 mg Intravenous Q24H   Continuous Infusions:    . sodium chloride 0.9 % 1,000 mL with potassium chloride 10 mEq infusion 20 mL/hr (09/05/11 1700)  . DISCONTD: lactated ringers     PRN  Meds:.acetaminophen, acetaminophen, alum & mag hydroxide-simeth, bisacodyl, diphenhydrAMINE, HYDROmorphone, menthol-cetylpyridinium, methocarbamol(ROBAXIN) IV, methocarbamol, metoCLOPramide (REGLAN) injection, metoCLOPramide, ondansetron (ZOFRAN) IV, ondansetron, phenol, sodium chloride, zolpidem   Objective: Weight change:   Intake/Output Summary (Last 24 hours) at 09/06/11 1053 Last data filed at 09/06/11 0859  Gross per 24 hour  Intake 2058.33 ml  Output   1175 ml  Net 883.33 ml   Blood pressure 118/63, pulse 106, temperature 98.1 F (36.7 C), temperature source Oral, resp. rate 18, height 5\' 5"  (1.651 m), weight 260 lb (117.935 kg), SpO2 96.00%. Temp:  [97.5 F (36.4 C)-98.5 F (36.9 C)] 98.1 F (36.7 C) (02/21 0444) Pulse Rate:  [94-116] 106  (02/21 0915) Resp:  [18] 18  (02/21 0444) BP: (118-131)/(63-83) 118/63 mmHg (02/21 0915) SpO2:  [94 %-96 %] 96 % (02/21 0444)  Physical Exam: General: Alert and awake, oriented x3, not in any acute distress.  HEENT: anicteric sclera, pupils reactive to light and accommodation, EOMI, oropharynx clear and without exudate  CVS regular rate, normal r, no murmur rubs or gallops  Chest:  clear to auscultation bilaterally, no wheezing, rales or rhonchi  Abdomen: soft nontender, nondistended, normal bowel sounds,  Extremities: right hip with dressing Skin: no rashes  Neuro: nonfocal, strength and sensation intact   Lab Results:  Basename 09/05/11 0410 09/04/11 0355  WBC 13.2* 20.1*  HGB 9.7* 8.2*  HCT 29.5* 25.0*  PLT 254 320    BMET  Basename 09/06/11 0810 09/05/11 0410  NA 130* 131*  K 4.1 4.4  CL 99 99  CO2 25 25  GLUCOSE 109* 87  BUN 44* 41*  CREATININE 2.34* 2.46*  CALCIUM 10.7* 8.5    Micro Results: Recent Results (from the past 240 hour(s))  SURGICAL PCR SCREEN     Status: Abnormal   Collection Time   08/28/11 10:33 AM      Component Value Range Status Comment   MRSA, PCR NEGATIVE  NEGATIVE  Final    Staphylococcus aureus POSITIVE (*) NEGATIVE  Final   GRAM STAIN     Status: Normal   Collection Time   09/03/11  8:08 AM      Component Value Range Status Comment   Specimen Description FLUID SYNOVIAL RIGHT HIP ON SWAB   Final    Special Requests NONE   Final    Gram Stain     Final    Value: ABUNDANT WBC PRESENT, PREDOMINANTLY PMN     NO ORGANISMS SEEN     Gram Stain Report Called to,Read Back By and Verified With:     DAVENPORT,S. RN AT [id number] ON 09/03/11 BY GILLESPIE,B.   Report Status 09/03/2011 FINAL   Final   BODY FLUID CULTURE     Status: Normal   Collection Time   09/03/11  8:09 AM      Component Value Range Status Comment   Specimen Description FLUID SYNOVIAL RIGHT HIP ON SWAB   Final    Special Requests NONE   Final    Gram Stain     Final    Value: ABUNDANT WBC PRESENT, PREDOMINANTLY PMN     NO ORGANISMS SEEN     Gram Stain Report Called to,Read Back By and Verified With: Gram Stain Report Called to,Read Back By and Verified With: Carolanne Grumbling RN @ [phone] ON 09/03/11 BY Ethel Rana B Performed by [facility]   Culture     Final    Value: RARE PSEUDOMONAS AERUGINOSA     Note: CRITICAL RESULT CALLED TO,  READ BACK BY AND VERIFIED  WITH: KIMBERLY OWENS   Report Status 09/06/2011 FINAL   Final    Organism ID, Bacteria PSEUDOMONAS AERUGINOSA   Final   ANAEROBIC CULTURE     Status: Normal (Preliminary result)   Collection Time   09/03/11  8:10 AM      Component Value Range Status Comment   Specimen Description FLUID SYNOVIAL RIGHT HIP   Final    Special Requests NONE   Final    Gram Stain     Final    Value: NO WBC SEEN     NO SQUAMOUS EPITHELIAL CELLS SEEN     NO ORGANISMS SEEN   Culture     Final    Value: NO ANAEROBES ISOLATED; CULTURE IN PROGRESS FOR 5 DAYS   Report Status PENDING   Incomplete     Studies/Results: Dg Chest Port 1 View  09/04/2011  *RADIOLOGY REPORT*  Clinical Data: For central line placement  PORTABLE CHEST - 1 VIEW  Comparison: Portable chest x-ray of 11/21 for a 1012  Findings: The right upper extremity PICC line tip is in the mid lower SVC.  No pneumothorax is seen.  Cardiomegaly is stable.  A lap band is present.  IMPRESSION: Right PICC line tip in mid lower SVC.  No pneumothorax.  Original Report Authenticated By: Joretta Bachelor, M.D.      Assessment/Plan: [name] is a 66 y.o. female with CKD, atrial fibrillation, HTN and THA infection sp I and D and washout with removal of all prosthetic components. 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 Patient DOES have a history of C difficile colitis making fluoroquinolone therapy MUCH less appealing --continue renally dosed ceftazidime --would treat for 8 weeks --pharmacy recommends bmp be checked thrice weekly in SNF to see if dose might need to be further adjusted --will need close followup with Dr. Alvan Dame and myself  -- I have made her appt with me on 10/17/11 at 90 in [facility] and I have put into [facility] manager and given her my card  2) CKD: renal fxn slightly improved. NOT going on vancomycin so this is less concern. Lasix could be  added back by MD in SNF as he sees fit. Pt herself is currently normotensive and not appearing to be suffering from volume overload  3) History of C difficile colitis: will start florastar, avoid ppi  I will sign off for now> Pt has my card and her followup appt is in the [place] manager. Please call with further questions.    LOS: 3 days   Alcide Evener 09/06/2011, 10:53 AM

## 2011-09-06 NOTE — Progress Notes (Signed)
Physical Therapy Treatment Patient Details Name: [name] MRN: [id number] DOB: 05/19/1946 Today's Date: 09/06/2011  PT Assessment/Plan  PT - Assessment/Plan PT Plan: Discharge plan remains appropriate PT Frequency: Min 3X/week Follow Up Recommendations: Skilled nursing facility Equipment Recommended: Defer to next venue PT Goals  Acute Rehab PT Goals PT Goal Formulation: With patient Time For Goal Achievement: 7 days Pt will go Supine/Side to Sit: with mod assist PT Goal: Supine/Side to Sit - Progress: Not progressing Pt will go Sit to Supine/Side: with mod assist PT Goal: Sit to Supine/Side - Progress: Not progressing Pt will go Sit to Stand: with mod assist PT Goal: Sit to Stand - Progress: Progressing toward goal Pt will go Stand to Sit: with mod assist PT Goal: Stand to Sit - Progress: Progressing toward goal Pt will Transfer Bed to Chair/Chair to Bed: with +2 total assist PT Transfer Goal: Bed to Chair/Chair to Bed - Progress: Progressing toward goal  PT Treatment Precautions/Restrictions  Precautions Precautions: Posterior Hip Restrictions Weight Bearing Restrictions: Yes RLE Weight Bearing: Touchdown weight bearing Mobility (including Balance) Bed Mobility Bed Mobility: Yes Supine to Sit: 1: +2 Total assist;HOB elevated (Comment degrees);With rails Supine to Sit Details (indicate cue type and reason): cues for sequence and to self assist with L LE and UEs - utilized draw sheet to assist - pt 25% Transfers Sit to Stand: From chair/3-in-1;From bed;With upper extremity assist;1: +2 Total assist Sit to Stand Details (indicate cue type and reason): pt=40  vc for pushing from chair Stand to Sit: 1: +2 Total assist;To chair/3-in-1;With upper extremity assist Stand to Sit Details: pt= 40 pt could not let go of RW to reach Stand Pivot Transfers: 1: +2 Total assist Stand Pivot Transfer Details (indicate cue type and reason): pt  scooted to turn to bsc about 3/4 way and  then lowered to sit on BSC.  had to lift each leg to scoot onto bsc. RLE out in front with min weight, no control of LE    Exercise    End of Session PT - End of Session Activity Tolerance: Patient tolerated treatment well Patient left: in chair;with call bell in reach Nurse Communication: Mobility status for transfers;Mobility status for ambulation General Behavior During Session: [facility] for tasks performed Cognition: [facility] for tasks performed  Claretha Cooper 09/06/2011, 1:06 PM

## 2011-09-06 NOTE — Progress Notes (Signed)
Physical Therapy Treatment Patient Details Name: EMMA GRUMAN MRN: [id number] DOB: October 12, 1945 Today's Date: 09/06/2011  R THR removal w/ antibiotic spacer TTWB 14:00 - 14:25 2 ta  PT Assessment/Plan  PT - Assessment/Plan Comments on Treatment Session: Assisted pt back to bed.  Pt plans to D/C to [facility] today. PT Plan: Discharge plan remains appropriate PT Frequency: Min 3X/week Follow Up Recommendations: Skilled nursing facility Equipment Recommended: Defer to next venue PT Goals  Acute Rehab PT Goals PT Goal Formulation: With patient Time For Goal Achievement: 7 days Pt will go Supine/Side to Sit: with mod assist PT Goal: Supine/Side to Sit - Progress: Progressing toward goal (slowly) Pt will go Sit to Supine/Side: with mod assist PT Goal: Sit to Supine/Side - Progress: Progressing toward goal (slowly) Pt will go Sit to Stand: with mod assist PT Goal: Sit to Stand - Progress: Progressing toward goal (slowly) Pt will go Stand to Sit: with mod assist PT Goal: Stand to Sit - Progress:  (slowly) Pt will Transfer Bed to Chair/Chair to Bed: with +2 total assist PT Transfer Goal: Bed to Chair/Chair to Bed - Progress: Progressing toward goal (slowly)  PT Treatment Precautions/Restrictions  Precautions Precautions: Other (comment);Posterior Hip (Hip hardware removed and spacer applied) Precaution Comments: Pt aware of her current  Restrictions Weight Bearing Restrictions: Yes RLE Weight Bearing: Touchdown weight bearing Other Position/Activity Restrictions: Pt aware she is TTWB Mobility (including Balance) Bed Mobility Bed Mobility: Yes (assisted pt back to bed) Supine to Sit: 1: +2 Total assist;With rails;HOB elevated (Comment degrees) Supine to Sit Details (indicate cue type and reason): cues for sequence and to self assist with L LE and UEs - utilized draw sheet to assist - pt 25% Sit to Supine: 1: +1 Total assist Sit to Supine - Details (indicate cue type and reason):  Total assist + 1 pt 40% with increased time Transfers Transfers: Yes Sit to Stand: 1: +2 Total assist;From bed;From chair/3-in-1 Sit to Stand Details (indicate cue type and reason): increased time and several attempts as pt uses momentum to stand Stand to Sit: 1: +2 Total assist;To bed Stand to Sit Details: total assist + 2 pt 40% with 25% VC's on turn completion prior to sit Stand Pivot Transfers: 1: +2 Total assist Stand Pivot Transfer Details (indicate cue type and reason): Total assist + 2 pt 40% with increased time to complete turn while maintaining TTWB R LE Ambulation/Gait Ambulation/Gait: No (Trans only) Stairs: No Wheelchair Mobility Wheelchair Mobility: No    Exercise    End of Session PT - End of Session Equipment Utilized During Treatment: Gait belt Activity Tolerance: Patient tolerated treatment well Patient left: in bed;with call bell in reach;with family/visitor present Nurse Communication: Other (comment) (Pt getting ready for D/C to [facility]) General Behavior During Session: [facility] for tasks performed Cognition: [facility] for tasks performed  Rica Koyanagi  PTA [facility]  Acute  Rehab Pager     [phone]

## 2011-09-07 ENCOUNTER — Other Ambulatory Visit: Payer: Self-pay | Admitting: Family Medicine

## 2011-09-07 LAB — ANAEROBIC CULTURE: Gram Stain: NONE SEEN

## 2011-09-07 NOTE — Progress Notes (Signed)
Pt d/c to [facility] ( [facility] ) via [facility] transport on 09/06/11 for [facility] SNF placement.

## 2011-09-10 ENCOUNTER — Emergency Department (HOSPITAL_COMMUNITY): Payer: Medicare Other

## 2011-09-10 ENCOUNTER — Other Ambulatory Visit: Payer: Self-pay

## 2011-09-10 ENCOUNTER — Inpatient Hospital Stay (HOSPITAL_COMMUNITY)
Admission: EM | Admit: 2011-09-10 | Discharge: 2011-10-02 | DRG: 166 | Disposition: A | Discharge status: Skilled Nursing Facility | Payer: Medicare Other | Attending: Internal Medicine | Admitting: Internal Medicine

## 2011-09-10 ENCOUNTER — Encounter (HOSPITAL_COMMUNITY): Payer: Self-pay | Admitting: Emergency Medicine

## 2011-09-10 DIAGNOSIS — T792XXA Traumatic secondary and recurrent hemorrhage and seroma, initial encounter: Secondary | ICD-10-CM

## 2011-09-10 DIAGNOSIS — J019 Acute sinusitis, unspecified: Secondary | ICD-10-CM

## 2011-09-10 DIAGNOSIS — M545 Low back pain: Secondary | ICD-10-CM

## 2011-09-10 DIAGNOSIS — F329 Major depressive disorder, single episode, unspecified: Secondary | ICD-10-CM

## 2011-09-10 DIAGNOSIS — I6350 Cerebral infarction due to unspecified occlusion or stenosis of unspecified cerebral artery: Secondary | ICD-10-CM

## 2011-09-10 DIAGNOSIS — D62 Acute posthemorrhagic anemia: Secondary | ICD-10-CM | POA: Diagnosis not present

## 2011-09-10 DIAGNOSIS — I10 Essential (primary) hypertension: Secondary | ICD-10-CM

## 2011-09-10 DIAGNOSIS — Z8673 Personal history of transient ischemic attack (TIA), and cerebral infarction without residual deficits: Secondary | ICD-10-CM

## 2011-09-10 DIAGNOSIS — L738 Other specified follicular disorders: Secondary | ICD-10-CM

## 2011-09-10 DIAGNOSIS — D72829 Elevated white blood cell count, unspecified: Secondary | ICD-10-CM

## 2011-09-10 DIAGNOSIS — I120 Hypertensive chronic kidney disease with stage 5 chronic kidney disease or end stage renal disease: Secondary | ICD-10-CM | POA: Diagnosis not present

## 2011-09-10 DIAGNOSIS — E876 Hypokalemia: Secondary | ICD-10-CM

## 2011-09-10 DIAGNOSIS — E872 Acidosis, unspecified: Secondary | ICD-10-CM | POA: Diagnosis not present

## 2011-09-10 DIAGNOSIS — I480 Paroxysmal atrial fibrillation: Secondary | ICD-10-CM | POA: Diagnosis present

## 2011-09-10 DIAGNOSIS — N189 Chronic kidney disease, unspecified: Secondary | ICD-10-CM | POA: Diagnosis present

## 2011-09-10 DIAGNOSIS — G9340 Encephalopathy, unspecified: Secondary | ICD-10-CM | POA: Diagnosis present

## 2011-09-10 DIAGNOSIS — Z79899 Other long term (current) drug therapy: Secondary | ICD-10-CM

## 2011-09-10 DIAGNOSIS — N186 End stage renal disease: Secondary | ICD-10-CM | POA: Diagnosis not present

## 2011-09-10 DIAGNOSIS — E860 Dehydration: Secondary | ICD-10-CM | POA: Diagnosis present

## 2011-09-10 DIAGNOSIS — G4733 Obstructive sleep apnea (adult) (pediatric): Secondary | ICD-10-CM | POA: Diagnosis present

## 2011-09-10 DIAGNOSIS — Z7982 Long term (current) use of aspirin: Secondary | ICD-10-CM

## 2011-09-10 DIAGNOSIS — M79604 Pain in right leg: Secondary | ICD-10-CM

## 2011-09-10 DIAGNOSIS — Z8679 Personal history of other diseases of the circulatory system: Secondary | ICD-10-CM

## 2011-09-10 DIAGNOSIS — N179 Acute kidney failure, unspecified: Secondary | ICD-10-CM | POA: Diagnosis present

## 2011-09-10 DIAGNOSIS — J189 Pneumonia, unspecified organism: Principal | ICD-10-CM

## 2011-09-10 DIAGNOSIS — J9620 Acute and chronic respiratory failure, unspecified whether with hypoxia or hypercapnia: Secondary | ICD-10-CM | POA: Diagnosis not present

## 2011-09-10 DIAGNOSIS — B354 Tinea corporis: Secondary | ICD-10-CM

## 2011-09-10 DIAGNOSIS — N19 Unspecified kidney failure: Secondary | ICD-10-CM

## 2011-09-10 DIAGNOSIS — F3289 Other specified depressive episodes: Secondary | ICD-10-CM | POA: Diagnosis present

## 2011-09-10 DIAGNOSIS — I428 Other cardiomyopathies: Secondary | ICD-10-CM | POA: Diagnosis present

## 2011-09-10 DIAGNOSIS — M1610 Unilateral primary osteoarthritis, unspecified hip: Secondary | ICD-10-CM | POA: Diagnosis present

## 2011-09-10 DIAGNOSIS — R112 Nausea with vomiting, unspecified: Secondary | ICD-10-CM

## 2011-09-10 DIAGNOSIS — R109 Unspecified abdominal pain: Secondary | ICD-10-CM

## 2011-09-10 DIAGNOSIS — A0472 Enterocolitis due to Clostridium difficile, not specified as recurrent: Secondary | ICD-10-CM

## 2011-09-10 DIAGNOSIS — IMO0002 Reserved for concepts with insufficient information to code with codable children: Secondary | ICD-10-CM

## 2011-09-10 DIAGNOSIS — J45909 Unspecified asthma, uncomplicated: Secondary | ICD-10-CM | POA: Diagnosis present

## 2011-09-10 DIAGNOSIS — M109 Gout, unspecified: Secondary | ICD-10-CM | POA: Diagnosis present

## 2011-09-10 DIAGNOSIS — R791 Abnormal coagulation profile: Secondary | ICD-10-CM | POA: Diagnosis present

## 2011-09-10 DIAGNOSIS — Z89629 Acquired absence of unspecified hip joint: Secondary | ICD-10-CM

## 2011-09-10 DIAGNOSIS — I4891 Unspecified atrial fibrillation: Secondary | ICD-10-CM

## 2011-09-10 DIAGNOSIS — E875 Hyperkalemia: Secondary | ICD-10-CM | POA: Diagnosis not present

## 2011-09-10 DIAGNOSIS — N6089 Other benign mammary dysplasias of unspecified breast: Secondary | ICD-10-CM

## 2011-09-10 DIAGNOSIS — Y838 Other surgical procedures as the cause of abnormal reaction of the patient, or of later complication, without mention of misadventure at the time of the procedure: Secondary | ICD-10-CM | POA: Diagnosis present

## 2011-09-10 DIAGNOSIS — G8190 Hemiplegia, unspecified affecting unspecified side: Secondary | ICD-10-CM

## 2011-09-10 DIAGNOSIS — D649 Anemia, unspecified: Secondary | ICD-10-CM

## 2011-09-10 DIAGNOSIS — Z6841 Body Mass Index (BMI) 40.0 and over, adult: Secondary | ICD-10-CM

## 2011-09-10 HISTORY — DX: Pneumonia, unspecified organism: J18.9

## 2011-09-10 HISTORY — DX: Hypercalcemia: E83.52

## 2011-09-10 LAB — CBC
HCT: 25.6 % — ABNORMAL LOW (ref 36.0–46.0)
Hemoglobin: 8.3 g/dL — ABNORMAL LOW (ref 12.0–15.0)
MCH: 28.1 pg (ref 26.0–34.0)
MCHC: 32.4 g/dL (ref 30.0–36.0)
MCV: 86.8 fL (ref 78.0–100.0)
Platelets: 423 10*3/uL — ABNORMAL HIGH (ref 150–400)
RBC: 2.95 MIL/uL — ABNORMAL LOW (ref 3.87–5.11)
RDW: 16.7 % — ABNORMAL HIGH (ref 11.5–15.5)
WBC: 13.4 10*3/uL — ABNORMAL HIGH (ref 4.0–10.5)

## 2011-09-10 LAB — URINE MICROSCOPIC-ADD ON

## 2011-09-10 LAB — COMPREHENSIVE METABOLIC PANEL
ALT: 9 U/L (ref 0–35)
AST: 24 U/L (ref 0–37)
Albumin: 1.8 g/dL — ABNORMAL LOW (ref 3.5–5.2)
Alkaline Phosphatase: 103 U/L (ref 39–117)
BUN: 56 mg/dL — ABNORMAL HIGH (ref 6–23)
CO2: 26 mEq/L (ref 19–32)
Calcium: 12.7 mg/dL — ABNORMAL HIGH (ref 8.4–10.5)
Chloride: 102 mEq/L (ref 96–112)
Creatinine, Ser: 5.16 mg/dL — ABNORMAL HIGH (ref 0.50–1.10)
GFR calc Af Amer: 9 mL/min — ABNORMAL LOW (ref >90)
GFR calc non Af Amer: 8 mL/min — ABNORMAL LOW (ref >90)
Glucose, Bld: 97 mg/dL (ref 70–99)
Potassium: 4.1 mEq/L (ref 3.5–5.1)
Sodium: 137 mEq/L (ref 135–145)
Total Bilirubin: 0.2 mg/dL — ABNORMAL LOW (ref 0.3–1.2)
Total Protein: 5.5 g/dL — ABNORMAL LOW (ref 6.0–8.3)

## 2011-09-10 LAB — TYPE AND SCREEN
ABO/RH(D): O POS
Antibody Screen: NEGATIVE

## 2011-09-10 LAB — URINALYSIS, ROUTINE W REFLEX MICROSCOPIC
Bilirubin Urine: NEGATIVE
Glucose, UA: 100 mg/dL — AB
Ketones, ur: NEGATIVE mg/dL
Leukocytes, UA: NEGATIVE
Nitrite: NEGATIVE
Protein, ur: 100 mg/dL — AB
Specific Gravity, Urine: 1.014 (ref 1.005–1.030)
Urobilinogen, UA: 0.2 mg/dL (ref 0.0–1.0)
pH: 6 (ref 5.0–8.0)

## 2011-09-10 LAB — DIFFERENTIAL
Basophils Absolute: 0.1 10*3/uL (ref 0.0–0.1)
Basophils Relative: 1 % (ref 0–1)
Eosinophils Absolute: 0.5 10*3/uL (ref 0.0–0.7)
Eosinophils Relative: 4 % (ref 0–5)
Lymphocytes Relative: 23 % (ref 12–46)
Lymphs Abs: 3.1 10*3/uL (ref 0.7–4.0)
Monocytes Absolute: 0.9 10*3/uL (ref 0.1–1.0)
Monocytes Relative: 7 % (ref 3–12)
Neutro Abs: 8.8 10*3/uL — ABNORMAL HIGH (ref 1.7–7.7)
Neutrophils Relative %: 65 % (ref 43–77)

## 2011-09-10 LAB — PROTIME-INR
INR: 1.68 — ABNORMAL HIGH (ref 0.00–1.49)
Prothrombin Time: 20.1 seconds — ABNORMAL HIGH (ref 11.6–15.2)

## 2011-09-10 LAB — APTT: aPTT: 45 seconds — ABNORMAL HIGH (ref 24–37)

## 2011-09-10 IMAGING — CR DG CHEST 1V
1 series · 1 of 1 positions shown · non-contrast
Comparison: [DATE]

CLINICAL DATA: Altered mental status.

CHEST - 1 VIEW

[x chest ap]
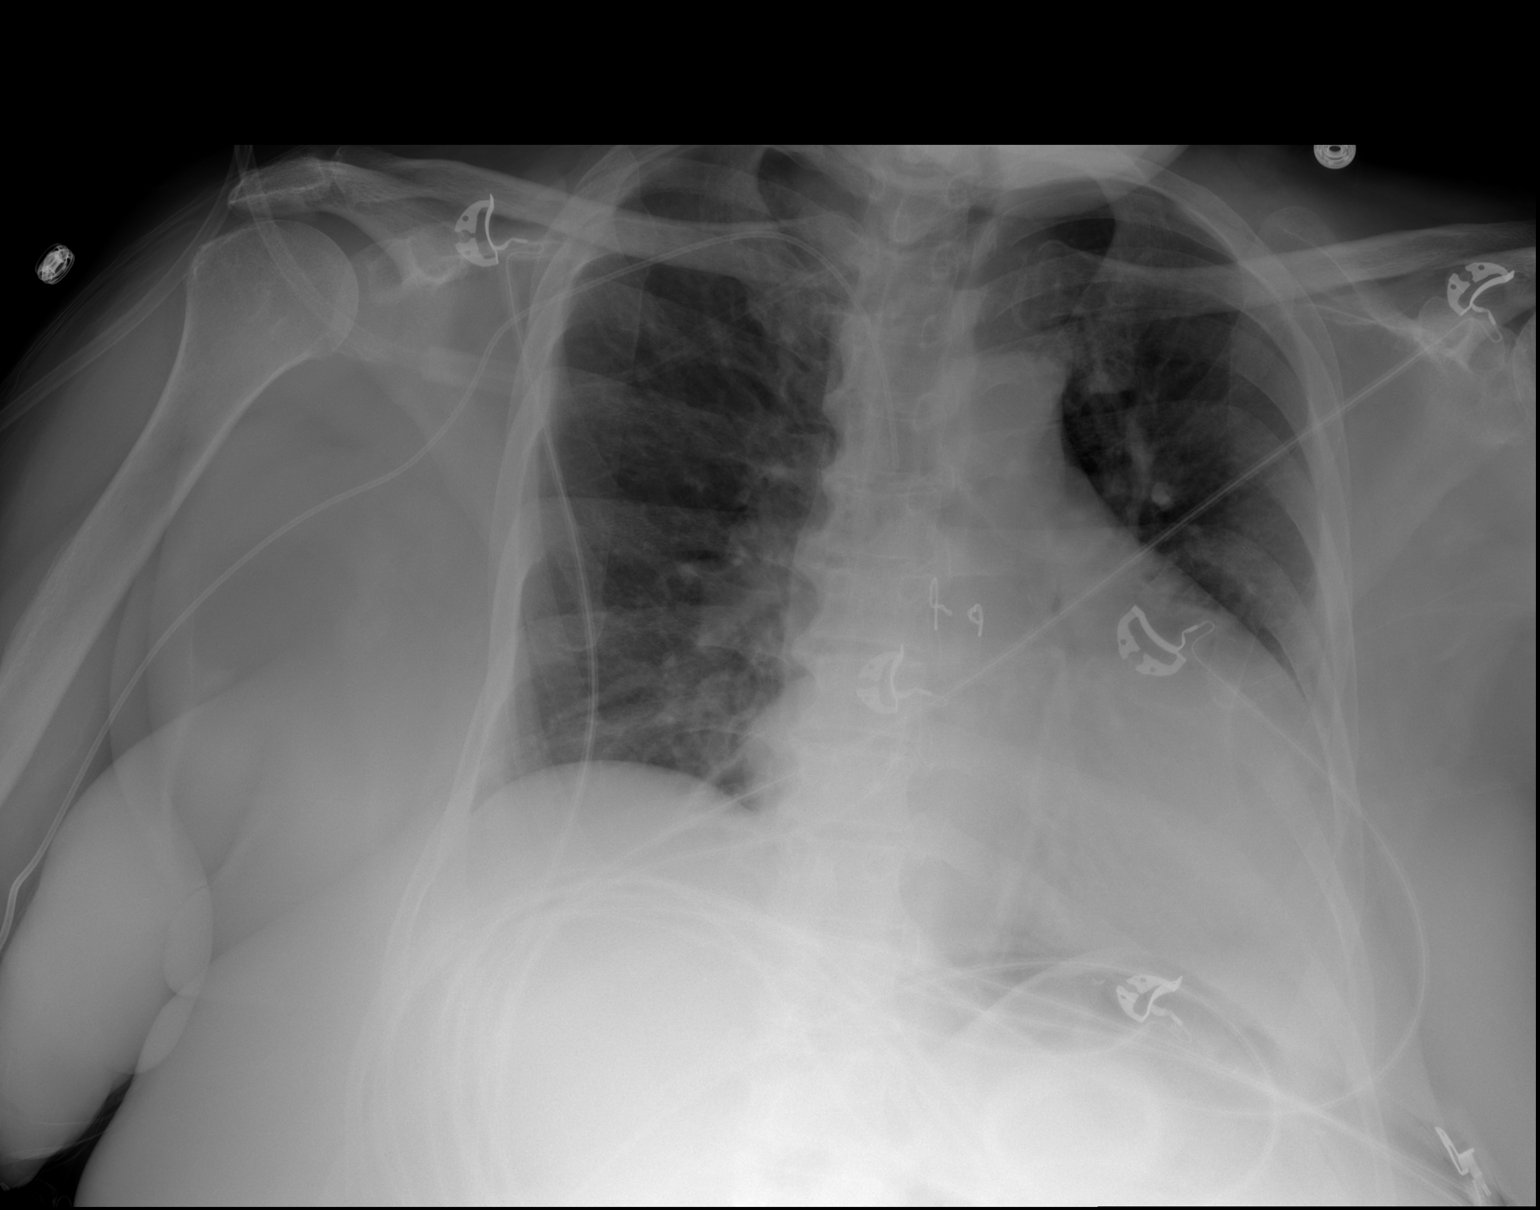

[1 of 1 positions shown; findings below may reference images not displayed]

FINDINGS: The patient has a right-sided PICC line, tip to the
superior vena cava.  The heart is enlarged.  There is dense opacity
at the left lung base which partially obscures the lateral
hemidiaphragm.  Findings are consistent with infectious infiltrate.
No evidence for pulmonary edema.  No evidence for pneumothorax.
There is degenerative change in the spine.
IMPRESSION: 1.  Cardiomegaly without pulmonary edema.
2.  Left lower lobe infiltrate.

## 2011-09-10 IMAGING — CT CT HEAD W/O CM
2 series · 16 of 30 positions shown, 20 images · non-contrast
Comparison: [DATE]

CLINICAL DATA: Confusion, weakness, previous stroke.

CT HEAD WITHOUT CONTRAST
TECHNIQUE: Contiguous axial images were obtained from the base of
the skull through the vertex without contrast.

[Series 2: head w/o · axial · non-contrast · 0.43mm/px · z∈[+292,+412]mm · 13 of 29 slices shown, 17 images]
[im 3/29  brain]
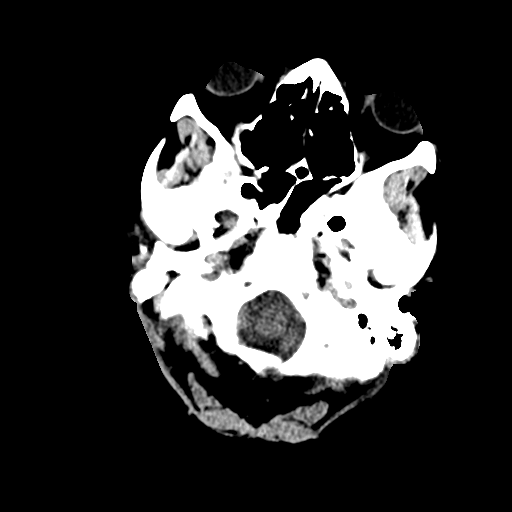
[im 3/29  bone]
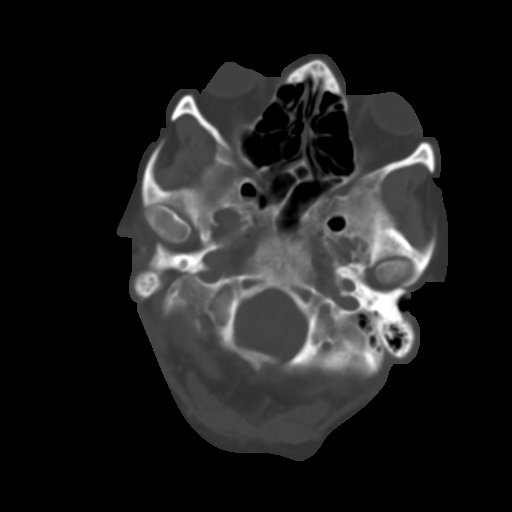
[im 5/29  brain]
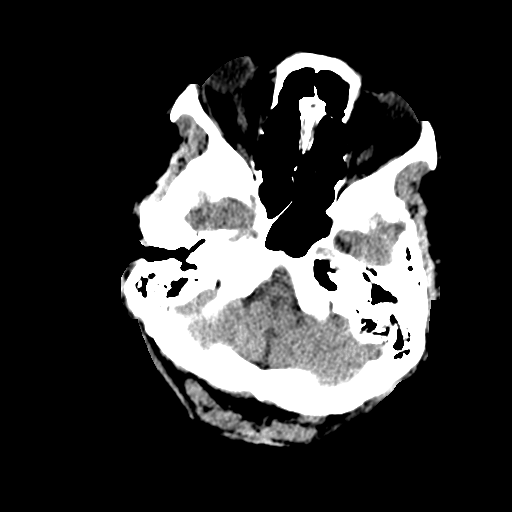
[im 7/29  brain]
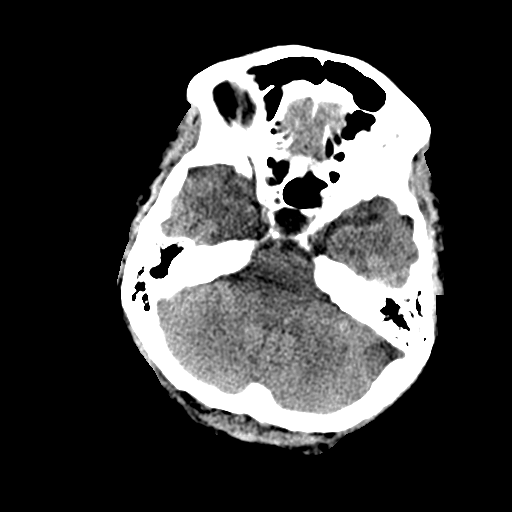
[im 9/29  brain]
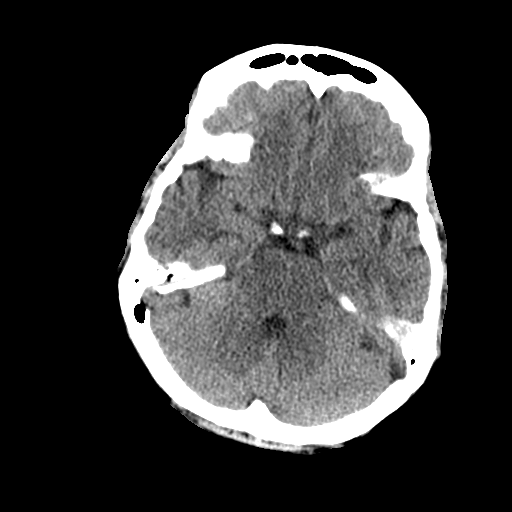
[im 11/29  brain]
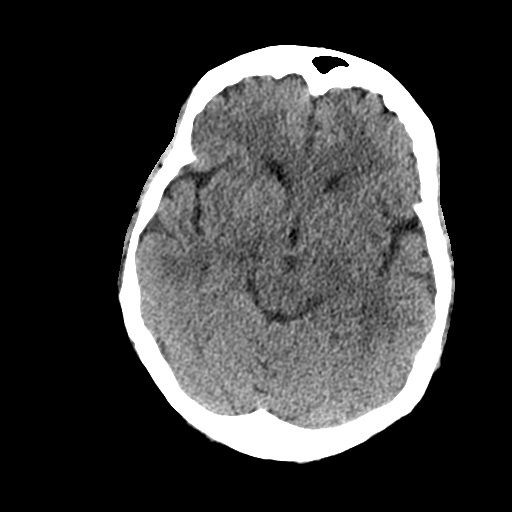
[im 11/29  bone]
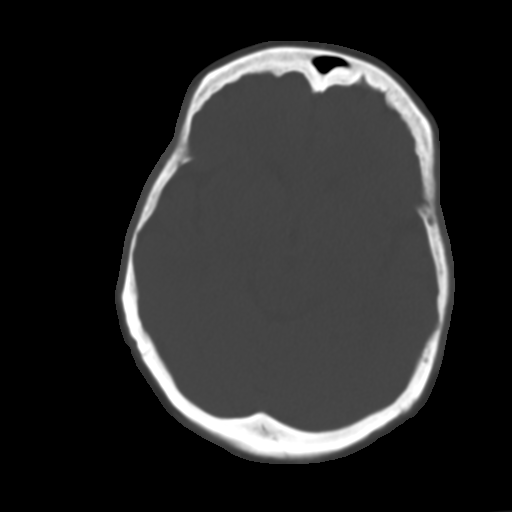
[im 13/29  brain]
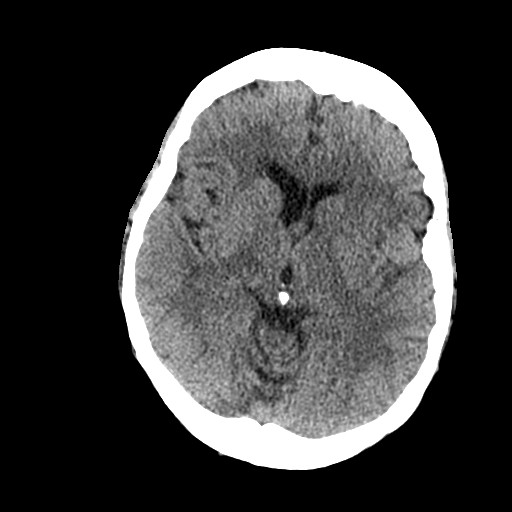
[im 15/29  brain]
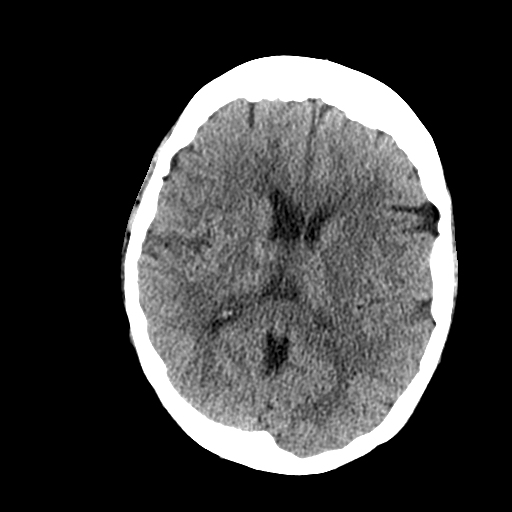
[im 17/29  brain]
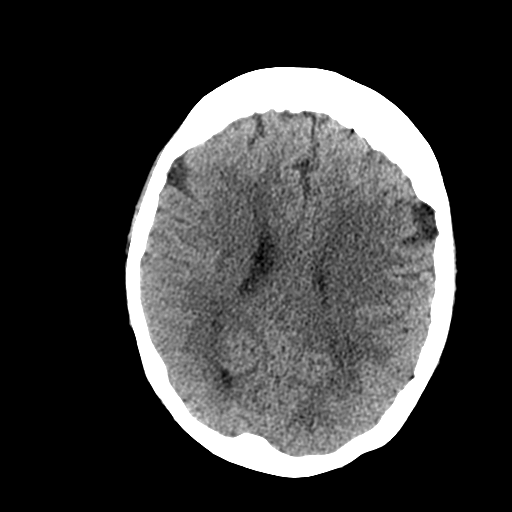
[im 19/29  brain]
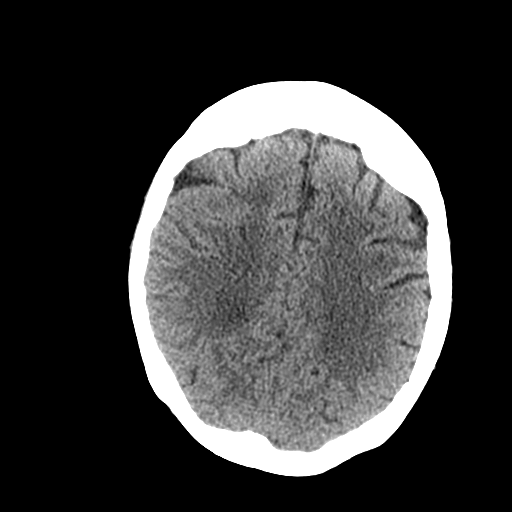
[im 19/29  bone]
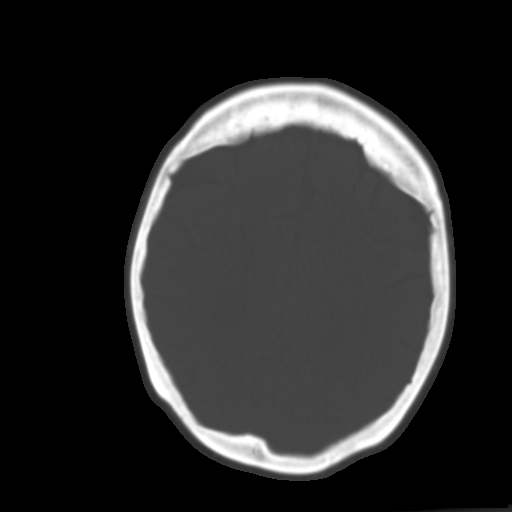
[im 21/29  brain]
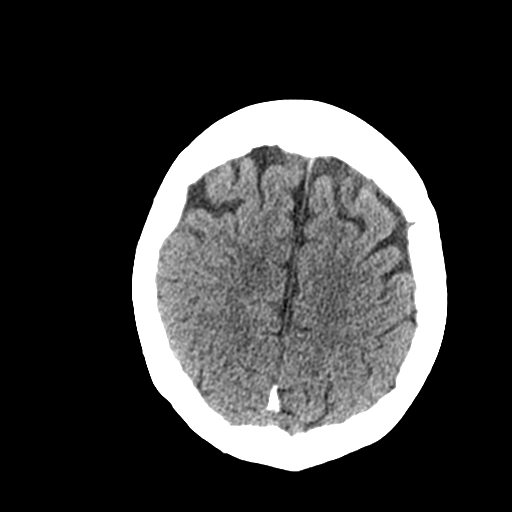
[im 23/29  brain]
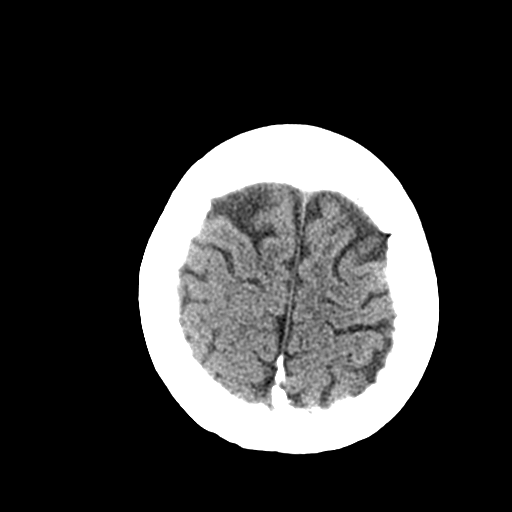
[im 25/29  brain]
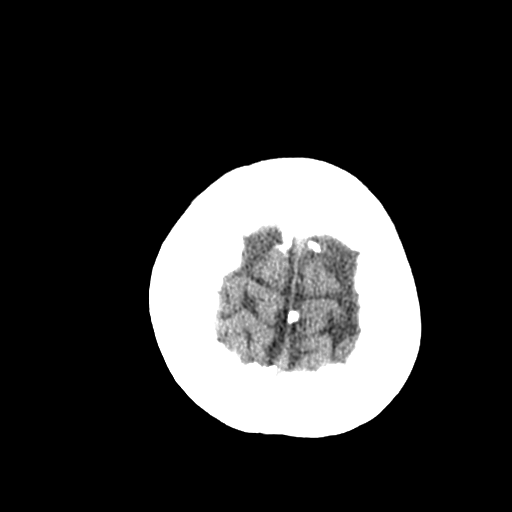
[im 27/29  brain]
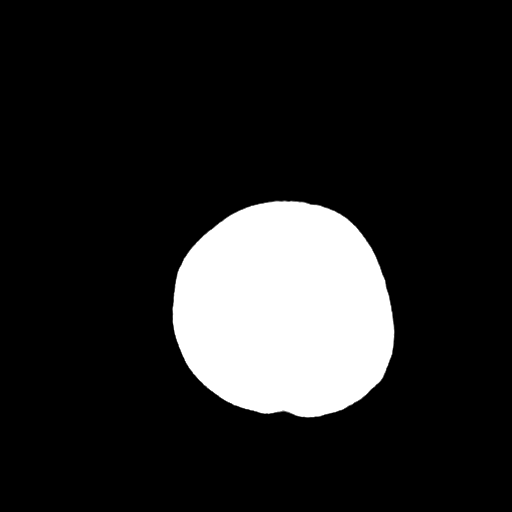
[im 27/29  bone]
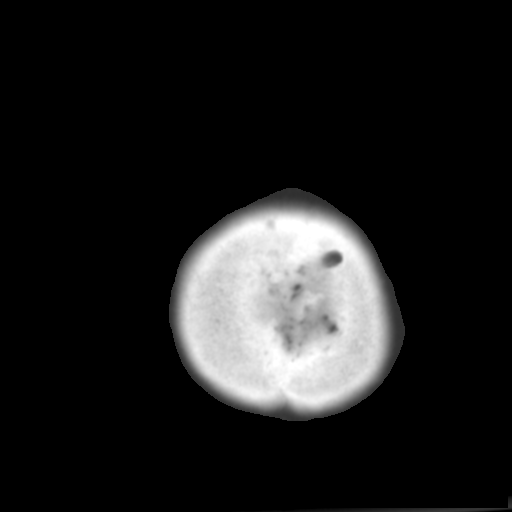

[Series 3: bone windows · axial · 0.43mm/px · z∈[+292,+332]mm · 3 of 29 slices shown]
[im 3/29  bone]
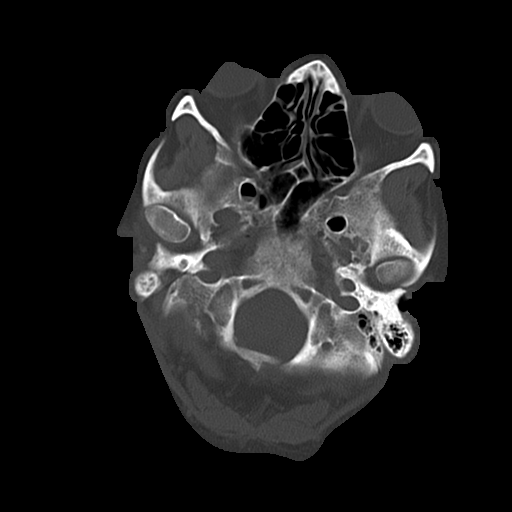
[im 7/29  bone]
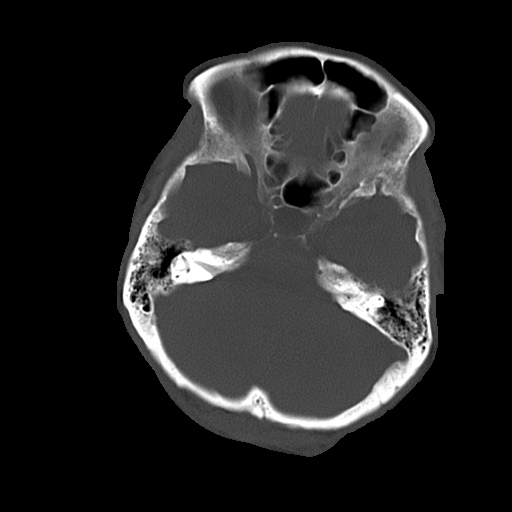
[im 11/29  bone]
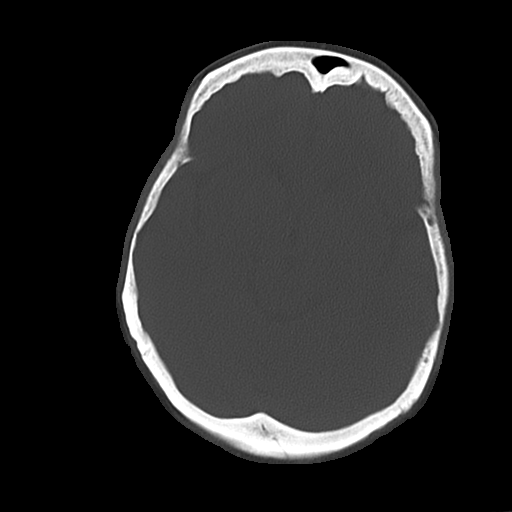

[16 of 30 positions shown; findings below may reference images not displayed]

FINDINGS: The previously noted right thalamic hemorrhage has
resolved. Atherosclerotic and physiologic intracranial
calcifications.  Mild atrophy . There is no evidence of acute
intracranial hemorrhage, brain edema, mass lesion, acute
infarction,   mass effect, or midline shift. Acute infarct may be
inapparent on noncontrast CT.  No other intra-axial abnormalities
are seen, and the ventricles and sulci are within normal limits in
size and symmetry.   No abnormal extra-axial fluid collections or
masses are identified.  No significant calvarial abnormality.
IMPRESSION: 1. Negative for bleed or other acute intracranial process.

## 2011-09-10 MED ORDER — DEXTROSE 5 % IV SOLN
1.0000 g | Freq: Once | INTRAVENOUS | Status: AC
  Administered 2011-09-11: 1 g via INTRAVENOUS
  Filled 2011-09-10: qty 1

## 2011-09-10 MED ORDER — VANCOMYCIN HCL 1000 MG IV SOLR
2000.0000 mg | Freq: Once | INTRAVENOUS | Status: AC
  Administered 2011-09-11: 2000 mg via INTRAVENOUS
  Filled 2011-09-10 (×2): qty 2000

## 2011-09-10 MED ORDER — LEVOFLOXACIN IN D5W 750 MG/150ML IV SOLN
750.0000 mg | Freq: Once | INTRAVENOUS | Status: AC
  Administered 2011-09-10: 750 mg via INTRAVENOUS
  Filled 2011-09-10: qty 150

## 2011-09-10 MED ORDER — SODIUM CHLORIDE 0.9 % IV BOLUS (SEPSIS)
1000.0000 mL | Freq: Once | INTRAVENOUS | Status: AC
  Administered 2011-09-10: 1000 mL via INTRAVENOUS

## 2011-09-10 MED ORDER — SODIUM CHLORIDE 0.9 % IV SOLN
1000.0000 mL | INTRAVENOUS | Status: DC
  Administered 2011-09-10: 1000 mL via INTRAVENOUS

## 2011-09-10 NOTE — ED Notes (Signed)
MD at bedside. 

## 2011-09-10 NOTE — ED Notes (Signed)
Patient from [facility]. Caregivers patient has had altered mental status since Saturday.  Pt. Was seen several times by her MD and he sent her here tonight.  She is being treated for a UTI and has a hemoglobin level of 6.6.  She has had infection removed from left hip on Feb. 18th.

## 2011-09-10 NOTE — ED Notes (Signed)
[id number] Expected date:09/10/11<BR> Expected time: 6:17 PM<BR> Means of arrival:Ambulance<BR> Comments:<BR> EMS 130 GC - ALOC/anemia

## 2011-09-10 NOTE — ED Notes (Signed)
Attempted to In and out cath pt, no urine returned x2.

## 2011-09-10 NOTE — ED Notes (Signed)
Family reports patient is confused at times and very weak.  Pt. Has history of prior stroke in 2009.  She has residual right sided weakness due to that stroke.

## 2011-09-10 NOTE — ED Provider Notes (Signed)
History     CSN: [id number]  Arrival date & time 09/10/11  [id number]   First MD Initiated Contact with Patient 09/10/11 1906      Chief Complaint  Patient presents with  . Altered Mental Status    (Consider location/radiation/quality/duration/timing/severity/associated sxs/prior treatment) HPI The last few days pt has been having trouble with confusion, slurred speech.  She has been residing at a nursing home.  PT was discharged on feb 18 for a complex hip infection requiring removal of the prothesis.   Pt has been in the hospital 8 times in the past year related to infections in that joint and hip. Pt had tests done at the nursing home and was noted to have a UTI and anemia.  Pt has no complaints other than pain in her leg where she had the surgery.  No fevers today.  She has vomited today but no diarrhea.   Past Medical History  Diagnosis Date  . Hypertension   . Depression   . Fractures, stress     in both feet  . Kidney disease     pt states creatinine is high  . 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 Stroke   . Gout   . Asthma     MILD NOW-NO INHALERS  . Sleep apnea     USES CPAP  . Anemia   . Blood transfusion   . Arthritis     HIP  . Atrial fibrillation     PT TAKES ASPIRIN AS BLOOD THINNER-HX OF CEREBRAL BLEED WHILE ON COUMADIN  . Arrhythmia   . Cardiomyopathy     Past Surgical History  Procedure Date  . Cholecystectomy   . 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 Joint replacement     right hip and right femur x 2  . Right femur fracture x2     FRACTURES WERE AFTER AFTER RIGHT HIP REPLACEMENT    Family History  Problem Relation Age of Onset  . Diabetes Mother   . Hypertension Mother   . Hodgkin's lymphoma  32    dscd---HODGKINS DISEASE  . Cancer Father     History  Substance Use Topics  . Smoking status: Never Smoker   . Smokeless tobacco: Never Used  . Alcohol Use: No    OB History    Grav Para Term Preterm  Abortions TAB SAB Ect Mult Living                  Review of Systems  Constitutional: Negative for fever.  HENT: Negative for neck pain.   Respiratory: Negative for shortness of breath.   Cardiovascular: Negative for chest pain.  Neurological: Positive for speech difficulty and weakness. Negative for tremors and headaches.  All other systems reviewed and are negative.    Allergies  Coumadin  Home Medications   Current Outpatient Rx  Name Route Sig Dispense Refill  . ALLOPURINOL 100 MG PO TABS Oral Take 300 mg by mouth daily.     . ASPIRIN 325 MG PO TABS Oral Take 1 tablet (325 mg total) by mouth 2 (two) times daily.      Start after finishing Xarelto.  . COLCHICINE 0.6 MG PO TABS Oral Take 1 tablet (0.6 mg total) by mouth daily. 31 tablet 0  . DILTIAZEM HCL ER COATED BEADS 300 MG PO CP24 Oral Take 300 mg by mouth daily before breakfast.     . DIPHENHYDRAMINE HCL 25 MG  PO TABS Oral Take 25 mg by mouth every 6 (six) hours as needed. Itching/rash    . DSS 100 MG CAPS Oral Take 100 mg by mouth 2 (two) times daily.    Marland Kitchen DOXERCALCIFEROL 0.5 MCG PO CAPS Oral Take 0.5 mcg by mouth daily before breakfast.     . FERROUS SULFATE 325 (65 FE) MG PO TABS Oral Take 325 mg by mouth 3 (three) times daily with meals.     . FUROSEMIDE 80 MG PO TABS Oral Take 80-160 mg by mouth 2 (two) times daily. 2 tab in am and 1 tab    . GABAPENTIN 300 MG PO CAPS Oral Take 300 mg by mouth 3 (three) times daily.     Marland Kitchen HYDROCODONE-ACETAMINOPHEN 7.5-325 MG PO TABS Oral Take 1-2 tablets by mouth every 4 (four) hours. 120 tablet 0  . METHOCARBAMOL 500 MG PO TABS Oral Take 1 tablet (500 mg total) by mouth every 6 (six) hours as needed (muscle spasms). 50 tablet 0  . METOPROLOL SUCCINATE ER 100 MG PO TB24 Oral Take 100 mg by mouth daily before breakfast.     . THERA M PLUS PO TABS Oral Take 1 tablet by mouth daily.     . NYSTOP 100000 UNIT/GM EX POWD Topical Apply 1 g topically daily as needed. Rash       .  POLYETHYLENE GLYCOL 3350 PO PACK Oral Take 17 g by mouth 2 (two) times daily. 14 each   . POTASSIUM CHLORIDE CRYS ER 20 MEQ PO TBCR Oral Take 20 mEq by mouth daily.     Marland Kitchen RIVAROXABAN 10 MG PO TABS Oral Take 1 tablet (10 mg total) by mouth daily. 14 tablet 0  . SACCHAROMYCES BOULARDII 250 MG PO CAPS Oral Take 1 capsule (250 mg total) by mouth 2 (two) times daily.    . SERTRALINE HCL 50 MG PO TABS Oral Take 1 tablet (50 mg total) by mouth daily before breakfast. 30 tablet 0    Office visit due now for a 90 day supply  . SOTALOL HCL 80 MG PO TABS Oral Take 80 mg by mouth 2 (two) times daily.       BP 125/60  Pulse 68  Temp(Src) 98.7 F (37.1 C) (Oral)  Resp 18  Ht 5\' 5"  (1.651 m)  Wt 368 lb (166.924 kg)  BMI 61.24 kg/m2  SpO2 100%  Physical Exam  Nursing note and vitals reviewed. Constitutional: No distress.       Listless but alert and oriented  HENT:  Head: Normocephalic and atraumatic.  Right Ear: External ear normal.  Left Ear: External ear normal.       Mucous membranes dry  Eyes: Conjunctivae are normal. Right eye exhibits no discharge. Left eye exhibits no discharge. No scleral icterus.  Neck: Neck supple. No tracheal deviation present.  Cardiovascular: Normal rate, regular rhythm and intact distal pulses.   Pulmonary/Chest: Effort normal and breath sounds normal. No stridor. No respiratory distress. She has no wheezes. She has no rales.  Abdominal: Soft. Bowel sounds are normal. She exhibits no distension. There is no tenderness. There is no rebound and no guarding.  Musculoskeletal: She exhibits no edema and no tenderness.       Surgical scar right hip with staples, no exudate noted around  Neurological: She is alert. She has normal strength. She displays no tremor. No cranial nerve deficit ( no gross defecits noted) or sensory deficit. She exhibits normal muscle tone. She displays no seizure activity. Coordination  normal. GCS eye subscore is 4. GCS verbal subscore is 5. GCS  motor subscore is 6.       General weakness in all extremities, patient able to squeeze my hands equally and wiggle her toes bilaterally no cranial nerve deficits noted on exam  Skin: Skin is warm and dry. No rash noted.  Psychiatric: She has a normal mood and affect.    ED Course  Procedures (including critical care time)  Date: 09/10/2011  Rate: 72  Rhythm: normal sinus rhythm  QRS Axis: left  Intervals: normal  ST/T Wave abnormalities: normal  Conduction Disutrbances:Incomplete left bundle branch block, first degree AV block  Narrative Interpretation: Incomplete left bundle branch block is new otherwise no significant changes  Old EKG Reviewed: changes noted   Labs Reviewed  CBC - Abnormal; Notable for the following:    WBC 13.4 (*)    RBC 2.95 (*)    Hemoglobin 8.3 (*)    HCT 25.6 (*)    RDW 16.7 (*)    Platelets 423 (*)    All other components within normal limits  COMPREHENSIVE METABOLIC PANEL - Abnormal; Notable for the following:    BUN 56 (*)    Creatinine, Ser 5.16 (*)    Calcium 12.7 (*)    Total Protein 5.5 (*)    Albumin 1.8 (*)    Total Bilirubin 0.2 (*)    GFR calc non Af Amer 8 (*)    GFR calc Af Amer 9 (*)    All other components within normal limits  PROTIME-INR - Abnormal; Notable for the following:    Prothrombin Time 20.1 (*)    INR 1.68 (*)    All other components within normal limits  APTT - Abnormal; Notable for the following:    aPTT 45 (*)    All other components within normal limits  DIFFERENTIAL  TYPE AND SCREEN  URINALYSIS, ROUTINE W REFLEX MICROSCOPIC  CULTURE, BLOOD (ROUTINE X 2)  CULTURE, BLOOD (ROUTINE X 2)  URINE CULTURE   Dg Chest 1 View  09/10/2011  *RADIOLOGY REPORT*  Clinical Data: Altered mental status.  CHEST - 1 VIEW  Comparison: 09/04/2011  Findings: The patient has a right-sided PICC line, tip to the superior vena cava.  The heart is enlarged.  There is dense opacity at the left lung base which partially obscures the  lateral hemidiaphragm.  Findings are consistent with infectious infiltrate. No evidence for pulmonary edema.  No evidence for pneumothorax. There is degenerative change in the spine.  IMPRESSION:  1.  Cardiomegaly without pulmonary edema. 2.  Left lower lobe infiltrate.  Original Report Authenticated By: Glenice Bow, M.D.   Ct Head Wo Contrast  09/10/2011  *RADIOLOGY REPORT*  Clinical Data: Confusion, weakness, previous stroke.  CT HEAD WITHOUT CONTRAST  Technique:  Contiguous axial images were obtained from the base of the skull through the vertex without contrast.  Comparison: 09/23/2008  Findings: The previously noted right thalamic hemorrhage has resolved. Atherosclerotic and physiologic intracranial calcifications.  Mild atrophy . There is no evidence of acute intracranial hemorrhage, brain edema, mass lesion, acute infarction,   mass effect, or midline shift. Acute infarct may be inapparent on noncontrast CT.  No other intra-axial abnormalities are seen, and the ventricles and sulci are within normal limits in size and symmetry.   No abnormal extra-axial fluid collections or masses are identified.  No significant calvarial abnormality.  IMPRESSION: 1. Negative for bleed or other acute intracranial process.  Original Report Authenticated By:  D. DANIEL HASSELL III, M.D.     1. Renal failure   2. Anemia   3. Hypercalcemia   4. Healthcare-associated pneumonia       MDM  The patient has several chronic medical problems he presented from [place]. She was recently in the hospital for an infected right hip prosthesis. The findings today suggests several abnormalities. Patient appears to be in acute renal failure with hypercalcemia. Previous labs showed her creatinine to be around 2. Today is significantly more elevated. Patient also has anemia which is slightly lower than it was when she was in the hospital. Her last prior hemoglobin was 9.7. There is no evidence of stroke on the CT  scan however her chest x-ray suggesting a left lower lobe infiltrate consistent with healthcare associated pneumonia. Patient has been given IV fluids which should help with renal failure and hypercalcemia. IV antibiotics for age Have been ordered as well. We will continue to monitor the patient closely and she'll be admitted to the hospital for further treatment  CRITICAL CARE Performed by: Donie Lemelin R   Total critical care time: 35  Critical care time was exclusive of separately billable procedures and treating other patients.  Critical care was necessary to treat or prevent imminent or life-threatening deterioration.  Critical care was time spent personally by me on the following activities: development of treatment plan with patient and/or surrogate as well as nursing, discussions with consultants, evaluation of patient's response to treatment, examination of patient, obtaining history from patient or surrogate, ordering and performing treatments and interventions, ordering and review of laboratory studies, ordering and review of radiographic studies, pulse oximetry and re-evaluation of patient's condition.        Kathalene Frames, MD 09/10/11 2137

## 2011-09-10 NOTE — Progress Notes (Addendum)
ANTIBIOTIC CONSULT NOTE - INITIAL  Pharmacy Consult for Vancomycin/Levaquin/Cefepime Indication: Suspected PNA  Allergies  Allergen Reactions  . Coumadin Other (See Comments)    Caused her to have a stroke    Patient Measurements: Height: 5\' 5"  (165.1 cm) Weight: 368 lb (166.924 kg) IBW/kg (Calculated) : 57    Vital Signs: Temp: 98.7 F (37.1 C) (02/25 1847) Temp src: Oral (02/25 1847) BP: 125/60 mmHg (02/25 1847) Pulse Rate: 68  (02/25 1847) Intake/Output from previous day:   Intake/Output from this shift:    Labs:  Basename 09/10/11 2000  WBC 13.4*  HGB 8.3*  PLT 423*  LABCREA --  CREATININE 5.16*   Estimated Creatinine Clearance: 17.8 ml/min (by C-G formula based on Cr of 5.16).   Microbiology: Recent Results (from the past 720 hour(s))  SURGICAL PCR SCREEN     Status: Abnormal   Collection Time   08/28/11 10:33 AM      Component Value Range Status Comment   MRSA, PCR NEGATIVE  NEGATIVE  Final    Staphylococcus aureus POSITIVE (*) NEGATIVE  Final   GRAM STAIN     Status: Normal   Collection Time   09/03/11  8:08 AM      Component Value Range Status Comment   Specimen Description FLUID SYNOVIAL RIGHT HIP ON SWAB   Final    Special Requests NONE   Final    Gram Stain     Final    Value: ABUNDANT WBC PRESENT, PREDOMINANTLY PMN     NO ORGANISMS SEEN     Gram Stain Report Called to,Read Back By and Verified With:     DAVENPORT,S. RN AT [id number] ON 09/03/11 BY GILLESPIE,B.   Report Status 09/03/2011 FINAL   Final   BODY FLUID CULTURE     Status: Normal   Collection Time   09/03/11  8:09 AM      Component Value Range Status Comment   Specimen Description FLUID SYNOVIAL RIGHT HIP ON SWAB   Final    Special Requests NONE   Final    Gram Stain     Final    Value: ABUNDANT WBC PRESENT, PREDOMINANTLY PMN     NO ORGANISMS SEEN     Gram Stain Report Called to,Read Back By and Verified With: Gram Stain Report Called to,Read Back By and Verified With: DAVENPORT S RN @  [phone] ON 09/03/11 BY Ethel Rana B Performed by [facility]   Culture     Final    Value: RARE PSEUDOMONAS AERUGINOSA     Note: CRITICAL RESULT CALLED TO, READ BACK BY AND VERIFIED WITH: KIMBERLY OWENS   Report Status 09/06/2011 FINAL   Final    Organism ID, Bacteria PSEUDOMONAS AERUGINOSA   Final   ANAEROBIC CULTURE     Status: Normal   Collection Time   09/03/11  8:10 AM      Component Value Range Status Comment   Specimen Description FLUID SYNOVIAL RIGHT HIP   Final    Special Requests NONE   Final    Gram Stain     Final    Value: NO WBC SEEN     NO SQUAMOUS EPITHELIAL CELLS SEEN     NO ORGANISMS SEEN   Culture NO ANAEROBES ISOLATED   Final    Report Status 09/07/2011 FINAL   Final     Medical History: Past Medical History  Diagnosis Date  . Hypertension   . Depression   . Fractures, stress  in both feet  . Kidney disease     pt states creatinine is high  . 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 Stroke   . Gout   . Asthma     MILD NOW-NO INHALERS  . Sleep apnea     USES CPAP  . Anemia   . Blood transfusion   . Arthritis     HIP  . Atrial fibrillation     PT TAKES ASPIRIN AS BLOOD THINNER-HX OF CEREBRAL BLEED WHILE ON COUMADIN  . Arrhythmia   . Cardiomyopathy     Medications:   (Not in a hospital admission) Scheduled:    . ceFEPime (MAXIPIME) IV  1 g Intravenous Once  . levofloxacin (LEVAQUIN) IV  750 mg Intravenous Once  . sodium chloride  1,000 mL Intravenous Once   Infusions:    . sodium chloride 1,000 mL (09/10/11 2136)   PRN:  Assessment:  61 YOF, SNF resident admitted w/AMS  Recent admission in February for infx of hip prosthesis, with several related admissions for same. Underwent excisional THA w/ABX spacers 09/03/2011  Synovial fluid cx of R Hip grew Pseudomonas aeruginosa (09/03/2011)  Was d/c to [facility] 09/06/2011 w/Fortaz 2G IV q12h (Per ID Recommendation)  CXR today reveals LLL infiltrate  Acute on chronic renal failure  w/Scr 5.16 (Baseline Scr ~2.3)  Pharmacy to dose Vancomycin  Patient has received dose of Cefepime and Levaquin in the ER (ONLY ONE DOSE EACH PER ER MD)  Goal of Therapy:  Vancomycin trough level 15-20 mcg/ml  Plan:  Vancomycin 2g IV x 1 dose Given acute on chronic renal failure, will plan to check level ~48h and dose based on levels ADMITTING MD TO ORDER ADD'L Linton Ham AS WARRANTED  Marcell Anger PharmD  [phone] 09/10/2011 9:49 PM    ADDENDUM:  Original order was for pharmacy to dose Vanc/Levaquin/Cefepime for HCAP. Ceftriaxone was ordered following first doses of these drugs.  Dyanne Carrel, NP agreed that we should switch back to Cefepime to double cover pseudomonas with Vanc/Levaquin/Cefepime regimen considering patient's history with pseudomonas (synovial fluid culture of right hip grew pseudomonas aeruginosa during recent admission).  Cefepime dose will be adjusted for patient's current renal clearance, CrCl ~14 mL/min.  Plan:  Cefepime 1g IV q24h.   Hershal Coria, PharmD, BCPS Pager: [phone] 09/11/2011 10:01 AM

## 2011-09-10 NOTE — H&P (Addendum)
PCP:   Garnet Koyanagi, DO, DO  Nephrologist Dr deterding Ortho Dr Alvan Dame  Chief Complaint:  Confusion  HPI: This is a 66 year old female, who is currently residing at [facility]. She recently had removal of hardware from right hip, with pacer placement due to infection. Today she started "talking out of her head"and was sent to the ER. There is no report of any fevers. She has chronic chills, she had some nausea and vomiting today. She reports some mild diarrhea. She denies any shortness of breath, wheezing, cough. She does report decreased urine output since Saturday. Here in the ear the patient was found to have pneumonia and a creatinine of 5.16. The patient is a creatinine is 2.3. The hospitalist service was called with progressive admit. History provided by husband who is at the bedside. Patient was slow but good historian.  Review of Systems:  The patient denies anorexia, fever, weight loss,, vision loss, decreased hearing, hoarseness, chest pain, syncope, dyspnea on exertion, peripheral edema, balance deficits, hemoptysis, abdominal pain, melena, hematochezia, severe indigestion/heartburn, hematuria, incontinence, genital sores, muscle weakness, suspicious skin lesions, transient blindness, difficulty walking, depression, unusual weight change, abnormal bleeding, enlarged lymph nodes, angioedema, and breast masses.  Past Medical History: Past Medical History  Diagnosis Date  . Hypertension   . Depression   . Fractures, stress     in both feet  . Kidney disease     pt states creatinine is high  . 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 Stroke   . Gout   . Asthma     MILD NOW-NO INHALERS  . Sleep apnea     USES CPAP  . Anemia   . Blood transfusion   . Arthritis     HIP  . Atrial fibrillation     PT TAKES ASPIRIN AS BLOOD THINNER-HX OF CEREBRAL BLEED WHILE ON COUMADIN  . Arrhythmia   . Cardiomyopathy    Past Surgical History  Procedure Date  . Cholecystectomy    . 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 Joint replacement     right hip and right femur x 2  . Right femur fracture x2     FRACTURES WERE AFTER AFTER RIGHT HIP REPLACEMENT    Medications: Prior to Admission medications   Medication Sig Start Date End Date Taking? Authorizing Provider  allopurinol (ZYLOPRIM) 100 MG tablet Take 300 mg by mouth daily.     Historical Provider, MD  aspirin 325 MG tablet Take 1 tablet (325 mg total) by mouth 2 (two) times daily. 09/06/11   Lucille Passy Babish, PA  colchicine 0.6 MG tablet Take 1 tablet (0.6 mg total) by mouth daily. 06/11/11 06/10/12  Irine Seal, MD  diltiazem (CARDIZEM CD) 300 MG 24 hr capsule Take 300 mg by mouth daily before breakfast.     Historical Provider, MD  diphenhydrAMINE (BENADRYL) 25 MG tablet Take 25 mg by mouth every 6 (six) hours as needed. Itching/rash    Historical Provider, MD  docusate sodium 100 MG CAPS Take 100 mg by mouth 2 (two) times daily. 09/06/11 09/16/11  Lucille Passy Babish, PA  doxercalciferol (HECTOROL) 0.5 MCG capsule Take 0.5 mcg by mouth daily before breakfast.     Historical Provider, MD  ferrous sulfate 325 (65 FE) MG tablet Take 325 mg by mouth 3 (three) times daily with meals.     Historical Provider, MD  furosemide (LASIX) 80 MG tablet Take 80-160 mg by mouth  2 (two) times daily. 2 tab in am and 1 tab    Historical Provider, MD  gabapentin (NEURONTIN) 300 MG capsule Take 300 mg by mouth 3 (three) times daily.     Historical Provider, MD  HYDROcodone-acetaminophen (NORCO) 7.5-325 MG per tablet Take 1-2 tablets by mouth every 4 (four) hours. 09/06/11 09/16/11  Lucille Passy Babish, PA  methocarbamol (ROBAXIN) 500 MG tablet Take 1 tablet (500 mg total) by mouth every 6 (six) hours as needed (muscle spasms). 09/06/11 09/16/11  Lucille Passy Babish, PA  metoprolol (TOPROL XL) 100 MG 24 hr tablet Take 100 mg by mouth daily before breakfast.     Historical Provider, MD  Multiple  Vitamins-Minerals (MULTIVITAMINS THER. W/MINERALS) TABS Take 1 tablet by mouth daily.     Historical Provider, MD  nystatin (NYSTOP) 100000 UNIT/GM POWD Apply 1 g topically daily as needed. Rash    06/11/11   Irine Seal, MD  polyethylene glycol [facility] Floria Raveling) packet Take 17 g by mouth 2 (two) times daily. 09/06/11 09/09/11  Lucille Passy Babish, PA  potassium chloride SA (KLOR-CON M20) 20 MEQ tablet Take 20 mEq by mouth daily.     Historical Provider, MD  rivaroxaban (XARELTO) 10 MG TABS tablet Take 1 tablet (10 mg total) by mouth daily. 09/06/11   Lucille Passy Babish, PA  saccharomyces boulardii (FLORASTOR) 250 MG capsule Take 1 capsule (250 mg total) by mouth 2 (two) times daily. 09/06/11 09/13/11  Lucille Passy Babish, PA  sertraline (ZOLOFT) 50 MG tablet Take 1 tablet (50 mg total) by mouth daily before breakfast. 08/28/11   Rosalita Chessman, DO  sotalol (BETAPACE) 80 MG tablet Take 80 mg by mouth 2 (two) times daily.     Historical Provider, MD    Allergies:   Allergies  Allergen Reactions  . Coumadin Other (See Comments)    Caused her to have a stroke    Social History:  reports that she has never smoked. She has never used smokeless tobacco. She reports that she does not drink alcohol or use illicit drugs.  Family History: Family History  Problem Relation Age of Onset  . Diabetes Mother   . Hypertension Mother   . Hodgkin's lymphoma  32    dscd---HODGKINS DISEASE  . Cancer Father     Physical Exam: Filed Vitals:   09/10/11 1841 09/10/11 1847 09/10/11 2159  BP:  125/60 116/56  Pulse:  68 73  Temp: 98.8 F (37.1 C) 98.7 F (37.1 C) 98 F (36.7 C)  TempSrc: Oral Oral Oral  Resp:  18   Height: 5\' 5"  (1.651 m)    Weight: 166.924 kg (368 lb)    SpO2:  100% 100%    General:  Alert and oriented times three, obese, no acute distress weak but alert and oriented Eyes: PERRLA, pink conjunctiva, no scleral icterus ENT: dry oral mucosa, neck supple, no  thyromegaly Lungs: clear to ascultation, no wheeze, no crackles, no use of accessory muscles Cardiovascular: regular rate and rhythm, no regurgitation, no gallops, no murmurs. No carotid bruits, no JVD Abdomen: soft, positive BS, non-tender, non-distended, no organomegaly, not an acute abdomen GU: not examined Neuro: CN II - XII grossly intact, sensation intact Musculoskeletal: strength 5/5 all extremities, right lower extremity not assessed  no clubbing, cyanosis or edema Skin: no rash, no subcutaneous crepitation, no decubitus Psych: appropriate patient   Labs on Admission:   Basename 09/10/11 2000  NA 137  K 4.1  CL 102  CO2 26  GLUCOSE  97  BUN 56*  CREATININE 5.16*  CALCIUM 12.7*  MG --  PHOS --    Basename 09/10/11 2000  AST 24  ALT 9  ALKPHOS 103  BILITOT 0.2*  PROT 5.5*  ALBUMIN 1.8*   No results found for this basename: LIPASE:2,AMYLASE:2 in the last 72 hours  Basename 09/10/11 2000  WBC 13.4*  NEUTROABS 8.8*  HGB 8.3*  HCT 25.6*  MCV 86.8  PLT 423*   No results found for this basename: CKTOTAL:3,CKMB:3,CKMBINDEX:3,TROPONINI:3 in the last 72 hours No components found with this basename: POCBNP:3 No results found for this basename: DDIMER:2 in the last 72 hours No results found for this basename: HGBA1C:2 in the last 72 hours No results found for this basename: CHOL:2,HDL:2,LDLCALC:2,TRIG:2,CHOLHDL:2,LDLDIRECT:2 in the last 72 hours No results found for this basename: TSH,T4TOTAL,FREET3,T3FREE,THYROIDAB in the last 72 hours No results found for this basename: VITAMINB12:2,FOLATE:2,FERRITIN:2,TIBC:2,IRON:2,RETICCTPCT:2 in the last 72 hours  Micro Results: Recent Results (from the past 240 hour(s))  GRAM STAIN     Status: Normal   Collection Time   09/03/11  8:08 AM      Component Value Range Status Comment   Specimen Description FLUID SYNOVIAL RIGHT HIP ON SWAB   Final    Special Requests NONE   Final    Gram Stain     Final    Value: ABUNDANT WBC  PRESENT, PREDOMINANTLY PMN     NO ORGANISMS SEEN     Gram Stain Report Called to,Read Back By and Verified With:     DAVENPORT,S. RN AT [id number] ON 09/03/11 BY GILLESPIE,B.   Report Status 09/03/2011 FINAL   Final   BODY FLUID CULTURE     Status: Normal   Collection Time   09/03/11  8:09 AM      Component Value Range Status Comment   Specimen Description FLUID SYNOVIAL RIGHT HIP ON SWAB   Final    Special Requests NONE   Final    Gram Stain     Final    Value: ABUNDANT WBC PRESENT, PREDOMINANTLY PMN     NO ORGANISMS SEEN     Gram Stain Report Called to,Read Back By and Verified With: Gram Stain Report Called to,Read Back By and Verified With: DAVENPORT S RN @ [phone] ON 09/03/11 BY Ethel Rana B Performed by [facility]   Culture     Final    Value: RARE PSEUDOMONAS AERUGINOSA     Note: CRITICAL RESULT CALLED TO, READ BACK BY AND VERIFIED WITH: KIMBERLY OWENS   Report Status 09/06/2011 FINAL   Final    Organism ID, Bacteria PSEUDOMONAS AERUGINOSA   Final   ANAEROBIC CULTURE     Status: Normal   Collection Time   09/03/11  8:10 AM      Component Value Range Status Comment   Specimen Description FLUID SYNOVIAL RIGHT HIP   Final    Special Requests NONE   Final    Gram Stain     Final    Value: NO WBC SEEN     NO SQUAMOUS EPITHELIAL CELLS SEEN     NO ORGANISMS SEEN   Culture NO ANAEROBES ISOLATED   Final    Report Status 09/07/2011 FINAL   Final      Radiological Exams on Admission: Dg Chest 1 View  09/10/2011  *RADIOLOGY REPORT*  Clinical Data: Altered mental status.  CHEST - 1 VIEW  Comparison: 09/04/2011  Findings: The patient has a right-sided PICC line, tip to the superior vena cava.  The heart is enlarged.  There is dense opacity at the left lung base which partially obscures the lateral hemidiaphragm.  Findings are consistent with infectious infiltrate. No evidence for pulmonary edema.  No evidence for pneumothorax. There is degenerative change in the spine.  IMPRESSION:  1.   Cardiomegaly without pulmonary edema. 2.  Left lower lobe infiltrate.  Original Report Authenticated By: Glenice Bow, M.D.   Ct Head Wo Contrast  09/10/2011  *RADIOLOGY REPORT*  Clinical Data: Confusion, weakness, previous stroke.  CT HEAD WITHOUT CONTRAST  Technique:  Contiguous axial images were obtained from the base of the skull through the vertex without contrast.  Comparison: 09/23/2008  Findings: The previously noted right thalamic hemorrhage has resolved. Atherosclerotic and physiologic intracranial calcifications.  Mild atrophy . There is no evidence of acute intracranial hemorrhage, brain edema, mass lesion, acute infarction,   mass effect, or midline shift. Acute infarct may be inapparent on noncontrast CT.  No other intra-axial abnormalities are seen, and the ventricles and sulci are within normal limits in size and symmetry.   No abnormal extra-axial fluid collections or masses are identified.  No significant calvarial abnormality.  IMPRESSION: 1. Negative for bleed or other acute intracranial process.  Original Report Authenticated By: Trecia Rogers, M.D.    Assessment/Plan Present on Admission:  . hospital-acquired Pneumonia Admit to telemetry Antibiotics - broad-spectrum Oxygen keep sats greater than 88% Nebulizers as needed Sputum cultures and blood cultures ordered  Acute and chronic kidney disease IV fluid hydration ordered Place Foley monitor I.'s and O.'s Consult Dr. Jimmy Footman Discontinue patient's Lasix 80 mg twice daily and daily aspirin Likely increased creatinine due to high dose Lasix, hopefully would decrease with IV hydration Care for fluid overload  Allopurinol, colchicine, neurontin d/ced. Adjust and renally dose if needed .Atrial fibrillation .HYPERTENSION .Morbid obesity Obstructive sleep apnea Stable resume home medications and CPAP machine Chronic anemia Basically unchanged, will order an anemia panel and type and screen Elevated  INR Unclear etiology Single dose VIT K RUQ [name] Hypercalcemia Check parathyroid hormone, PTH related hormone, Vit D metabolite metbolite, TSH IVF hydration ?occult malignancy - tumor markers ordered Consider Pamidronate if Ca does not improve with IVF hydration   SCDs for DVT prophylaxis Full code Team 5/Dr. Candie Chroman, Jarrod Mcenery 09/10/2011, 11:13 PM

## 2011-09-11 ENCOUNTER — Inpatient Hospital Stay (HOSPITAL_COMMUNITY): Payer: Medicare Other

## 2011-09-11 ENCOUNTER — Encounter (HOSPITAL_COMMUNITY): Payer: Self-pay | Admitting: Internal Medicine

## 2011-09-11 DIAGNOSIS — J189 Pneumonia, unspecified organism: Secondary | ICD-10-CM | POA: Insufficient documentation

## 2011-09-11 LAB — BASIC METABOLIC PANEL
BUN: 56 mg/dL — ABNORMAL HIGH (ref 6–23)
CO2: 24 mEq/L (ref 19–32)
Calcium: 11.9 mg/dL — ABNORMAL HIGH (ref 8.4–10.5)
Chloride: 105 mEq/L (ref 96–112)
Creatinine, Ser: 5.32 mg/dL — ABNORMAL HIGH (ref 0.50–1.10)
GFR calc Af Amer: 9 mL/min — ABNORMAL LOW (ref >90)
GFR calc non Af Amer: 8 mL/min — ABNORMAL LOW (ref >90)
Glucose, Bld: 90 mg/dL (ref 70–99)
Potassium: 4 mEq/L (ref 3.5–5.1)
Sodium: 137 mEq/L (ref 135–145)

## 2011-09-11 LAB — RETICULOCYTES
RBC.: 2.75 MIL/uL — ABNORMAL LOW (ref 3.87–5.11)
Retic Count, Absolute: 66 10*3/uL (ref 19.0–186.0)
Retic Ct Pct: 2.4 % (ref 0.4–3.1)

## 2011-09-11 LAB — AFP TUMOR MARKER: AFP-Tumor Marker: 2.4 ng/mL (ref 0.0–8.0)

## 2011-09-11 LAB — IRON AND TIBC
Iron: 35 ug/dL — ABNORMAL LOW (ref 42–135)
Saturation Ratios: 29 % (ref 20–55)
TIBC: 122 ug/dL — ABNORMAL LOW (ref 250–470)
UIBC: 87 ug/dL — ABNORMAL LOW (ref 125–400)

## 2011-09-11 LAB — VITAMIN B12: Vitamin B-12: 754 pg/mL (ref 211–911)

## 2011-09-11 LAB — CA 125: CA 125: 13.1 U/mL (ref 0.0–30.2)

## 2011-09-11 LAB — CANCER ANTIGEN 15-3: Cancer Antigen-Breast 15-3: 5 U/mL (ref <32)

## 2011-09-11 LAB — CEA: CEA: 1.7 ng/mL (ref 0.0–5.0)

## 2011-09-11 LAB — FERRITIN: Ferritin: 218 ng/mL (ref 10–291)

## 2011-09-11 LAB — FOLATE: Folate: 6 ng/mL

## 2011-09-11 LAB — TSH: TSH: 2.655 u[IU]/mL (ref 0.350–4.500)

## 2011-09-11 LAB — CANCER ANTIGEN 27.29: CA 27.29: 7 U/mL (ref 0–39)

## 2011-09-11 LAB — PROTIME-INR
INR: 1.61 — ABNORMAL HIGH (ref 0.00–1.49)
Prothrombin Time: 19.4 seconds — ABNORMAL HIGH (ref 11.6–15.2)

## 2011-09-11 LAB — VITAMIN D 25 HYDROXY (VIT D DEFICIENCY, FRACTURES): Vit D, 25-Hydroxy: 13 ng/mL — ABNORMAL LOW (ref 30–89)

## 2011-09-11 LAB — MRSA PCR SCREENING: MRSA by PCR: NEGATIVE

## 2011-09-11 LAB — CANCER ANTIGEN 19-9: CA 19-9: 6.3 U/mL — ABNORMAL LOW (ref <35.0)

## 2011-09-11 IMAGING — US US ABDOMEN COMPLETE
1 series · 13 of 25 positions shown · non-contrast
Comparison: Renal ultrasound [DATE]

CLINICAL DATA: Elevated INR, liver disease, morbid obesity, prior
cholecystectomy

ULTRASOUND ABDOMEN:
TECHNIQUE: Sonography of upper abdominal structures was performed.
Examination limited by body habitus and bowel gas.

[Series 1: us abdomen complete · 0.28mm/px · 13 of 61 slices shown]
[im 1/61]
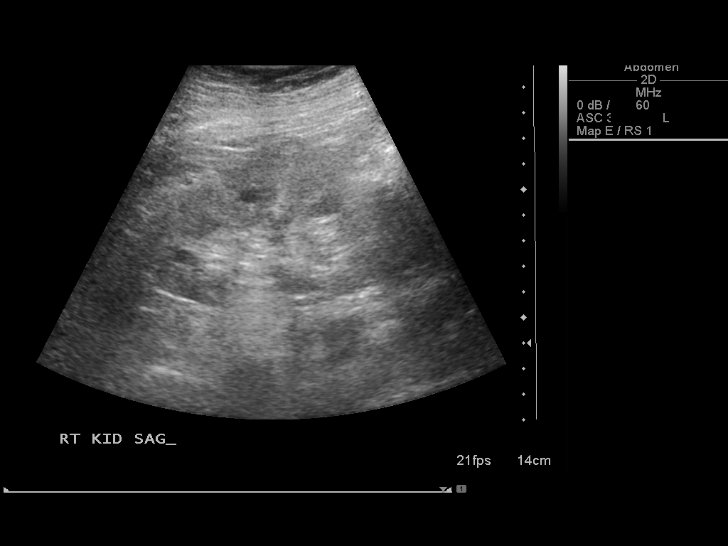
[im 6/61]
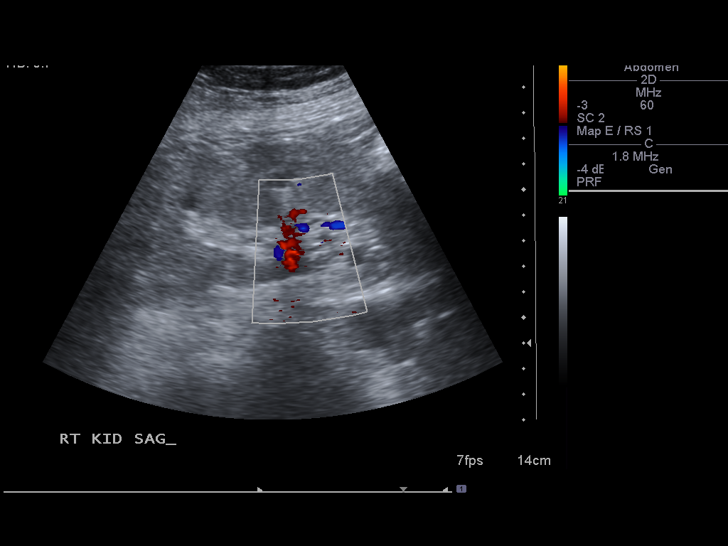
[im 11/61]
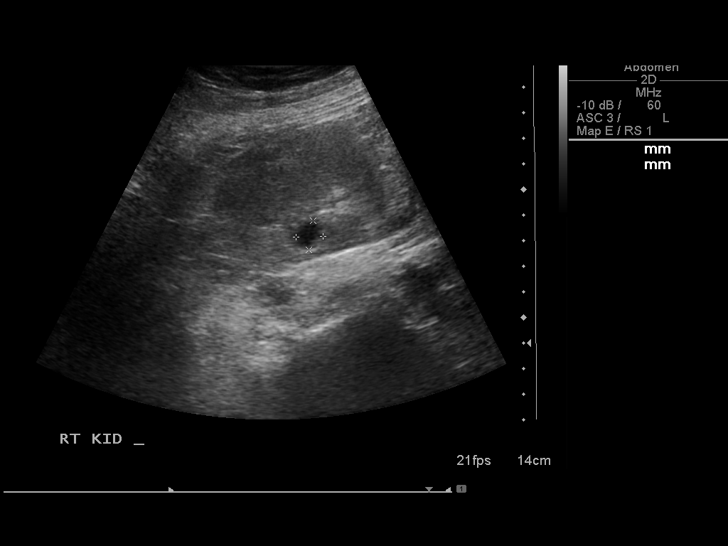
[im 16/61]
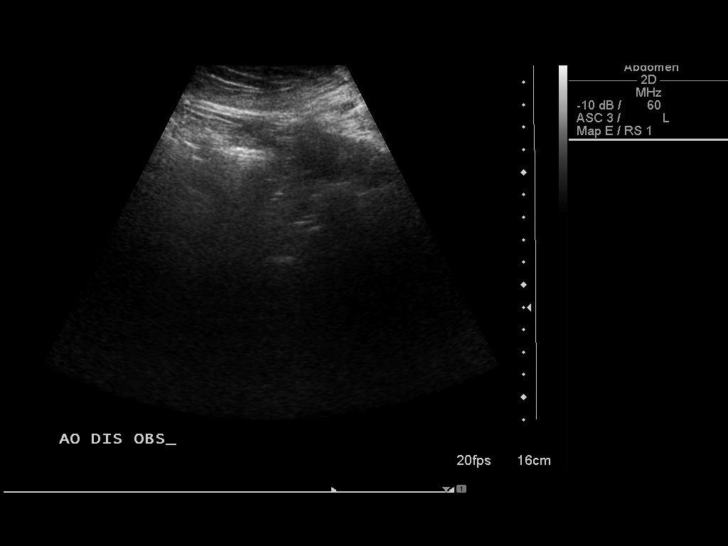
[im 21/61]
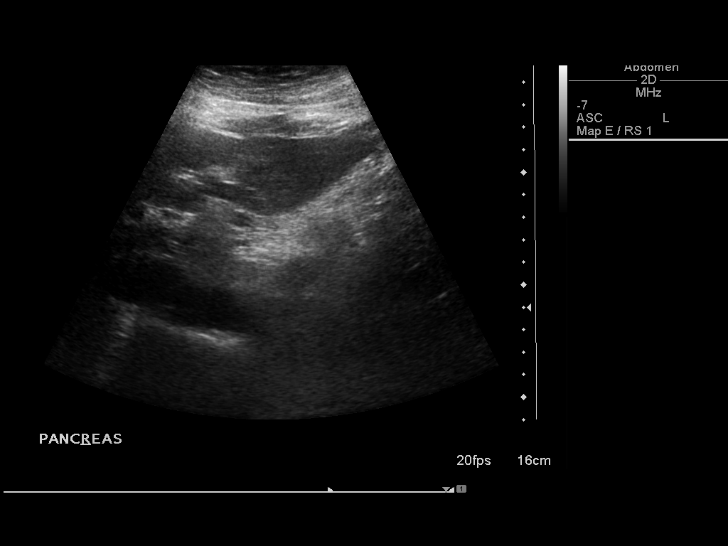
[im 26/61]
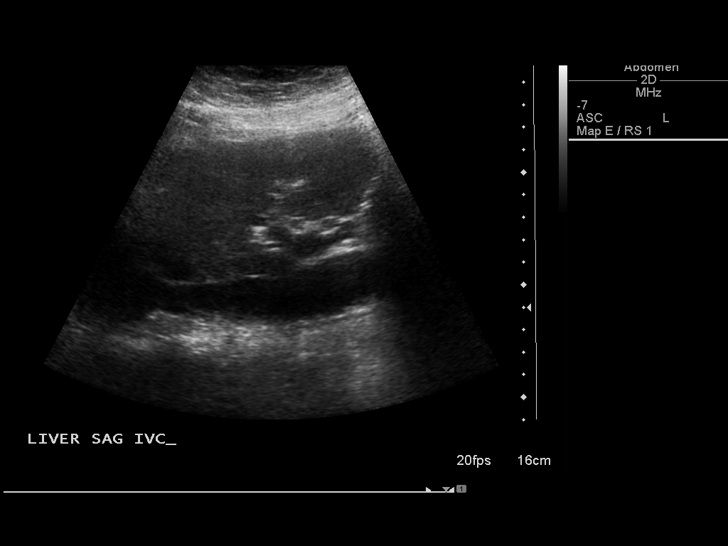
[im 31/61]
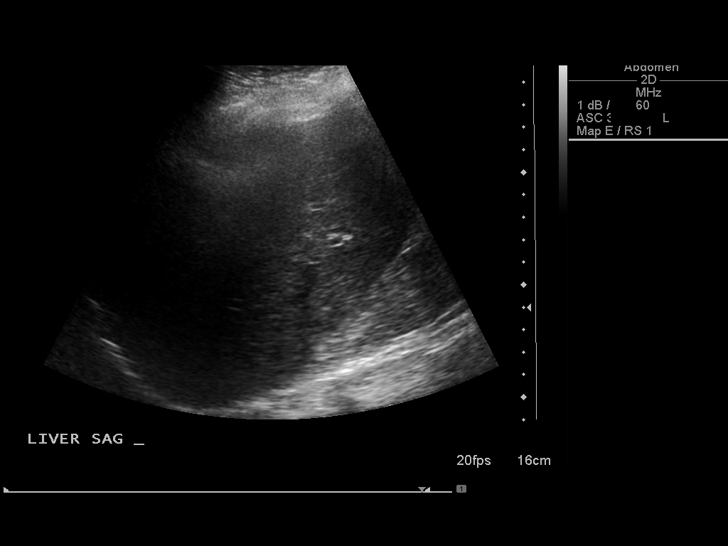
[im 36/61]
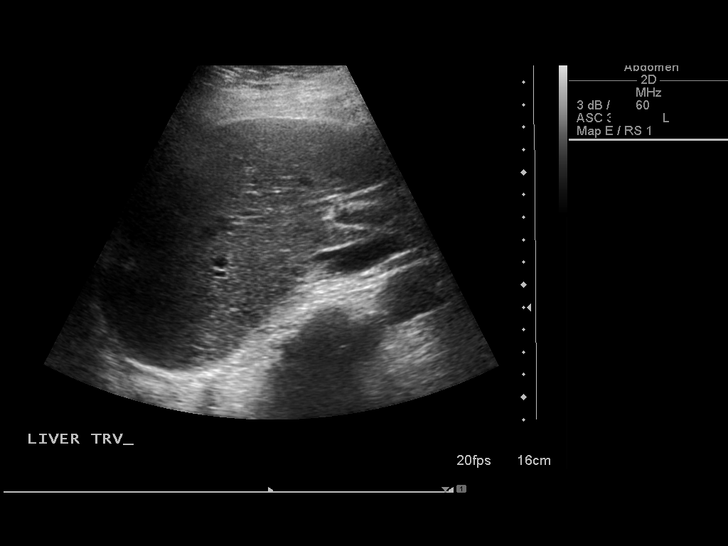
[im 41/61]
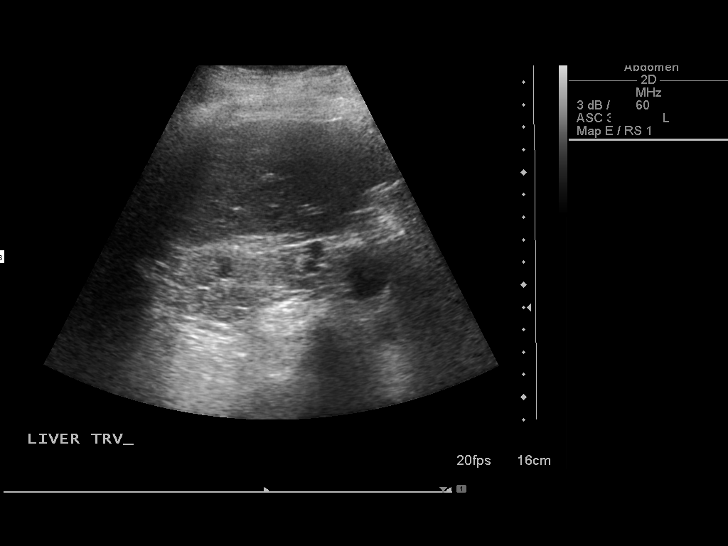
[im 46/61]
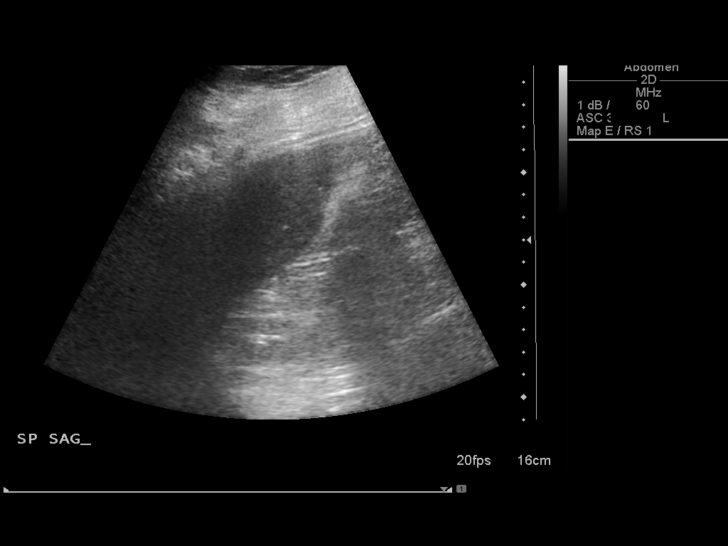
[im 51/61]
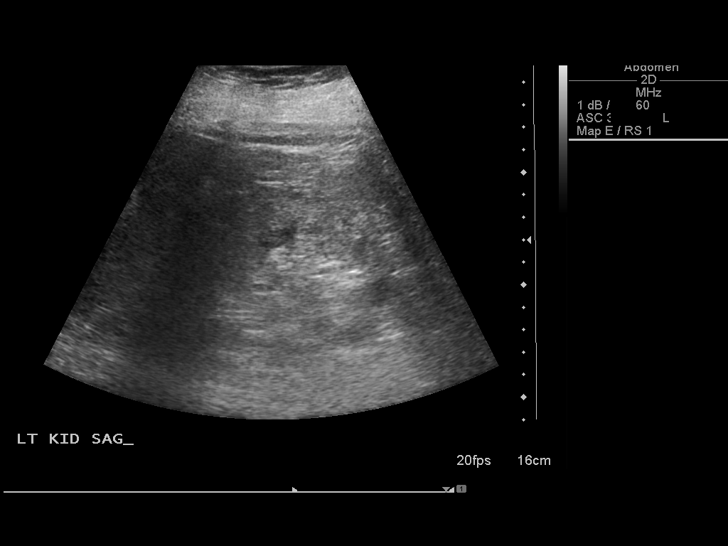
[im 56/61]
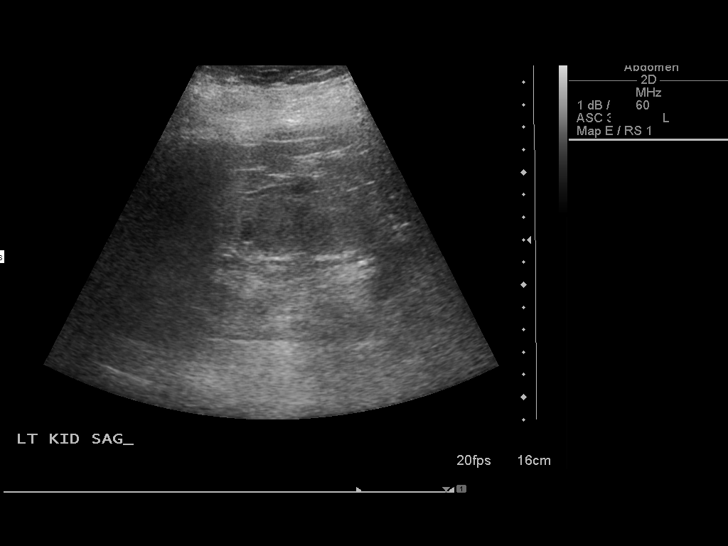
[im 61/61]
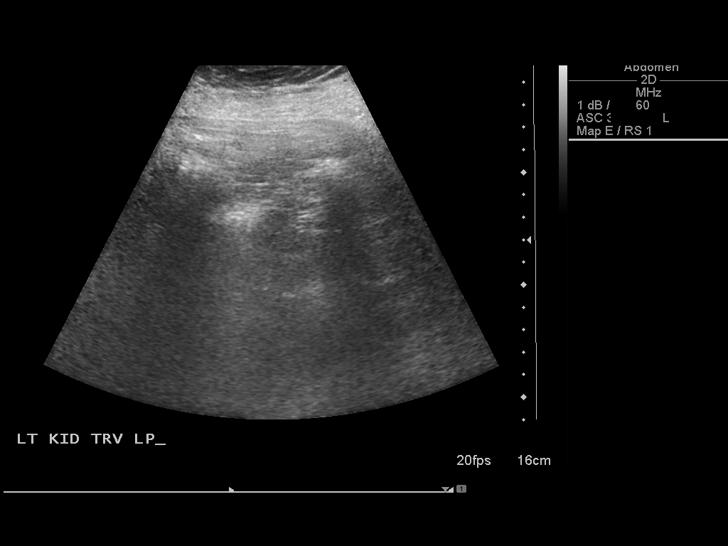

[13 of 25 positions shown; findings below may reference images not displayed]

Gallbladder:  Surgically absent

Common bile duct:  8 mm diameter, may be normal post
cholecystectomy

Liver:  Normal appearance

IVC:  Normal appearance

Pancreas:  Suboptimally visualized due to bowel gas, with portions
of head and tail obscured.  Visualized portions normal appearance.

Spleen:  Normal appearance, 5.9 cm length

Right kidney:  10.8 cm length.  Increased cortical echogenicity.
Small cyst 1.1 x 1.2 x 1.0 cm.  No gross hydronephrosis or
shadowing calcification.

Left kidney:  Suboptimally visualized.  Approximately 10.7 cm
length.  Marked cortical thinning.  Increased cortical
echogenicity.  No gross mass or hydronephrosis seen on limited
assessment.

Aorta:  Proximally normal caliber, remainder obscured by bowel gas.

Other:  No free fluid
IMPRESSION: Medical renal disease changes of both kidneys with cortical atrophy
of left kidney.
Post cholecystectomy with slightly dilated common bile duct 8 mm
diameter, which may be physiologic; recommend correlation with
LFTs.
Incomplete visualization of pancreas, aorta and left kidney.

## 2011-09-11 MED ORDER — ONDANSETRON HCL 4 MG/2ML IJ SOLN
4.0000 mg | Freq: Four times a day (QID) | INTRAMUSCULAR | Status: DC | PRN
  Administered 2011-09-11: 4 mg via INTRAVENOUS

## 2011-09-11 MED ORDER — LEVOFLOXACIN IN D5W 500 MG/100ML IV SOLN
500.0000 mg | INTRAVENOUS | Status: DC
  Administered 2011-09-12 – 2011-09-16 (×3): 500 mg via INTRAVENOUS
  Filled 2011-09-11 (×5): qty 100

## 2011-09-11 MED ORDER — METOPROLOL SUCCINATE ER 100 MG PO TB24
100.0000 mg | ORAL_TABLET | Freq: Every day | ORAL | Status: DC
  Administered 2011-09-11: 100 mg via ORAL
  Filled 2011-09-11 (×4): qty 1

## 2011-09-11 MED ORDER — ONDANSETRON HCL 4 MG/2ML IJ SOLN
INTRAMUSCULAR | Status: AC
  Administered 2011-09-11: 4 mg via INTRAVENOUS
  Filled 2011-09-11: qty 2

## 2011-09-11 MED ORDER — DEXTROSE 5 % IV SOLN
1.0000 g | INTRAVENOUS | Status: DC
  Administered 2011-09-12 – 2011-09-13 (×2): 1 g via INTRAVENOUS
  Filled 2011-09-11 (×4): qty 1

## 2011-09-11 MED ORDER — SERTRALINE HCL 50 MG PO TABS
50.0000 mg | ORAL_TABLET | Freq: Every day | ORAL | Status: DC
  Administered 2011-09-11: 50 mg via ORAL
  Filled 2011-09-11: qty 1

## 2011-09-11 MED ORDER — SOTALOL HCL 80 MG PO TABS
80.0000 mg | ORAL_TABLET | Freq: Two times a day (BID) | ORAL | Status: DC
  Administered 2011-09-11: 80 mg via ORAL
  Filled 2011-09-11 (×7): qty 1

## 2011-09-11 MED ORDER — HYDROCODONE-ACETAMINOPHEN 7.5-325 MG PO TABS
1.0000 | ORAL_TABLET | ORAL | Status: DC
  Administered 2011-09-11 (×2): 1 via ORAL
  Filled 2011-09-11 (×2): qty 1

## 2011-09-11 MED ORDER — RIVAROXABAN 10 MG PO TABS
10.0000 mg | ORAL_TABLET | Freq: Every day | ORAL | Status: DC
  Filled 2011-09-11: qty 1

## 2011-09-11 MED ORDER — SODIUM CHLORIDE 0.9 % IV SOLN
1000.0000 mL | INTRAVENOUS | Status: DC
  Administered 2011-09-11: 1000 mL via INTRAVENOUS

## 2011-09-11 MED ORDER — NYSTATIN 100000 UNIT/GM EX POWD
1.0000 g | Freq: Every day | CUTANEOUS | Status: DC | PRN
  Filled 2011-09-11: qty 15

## 2011-09-11 MED ORDER — ENOXAPARIN SODIUM 30 MG/0.3ML ~~LOC~~ SOLN
30.0000 mg | Freq: Every day | SUBCUTANEOUS | Status: DC
  Administered 2011-09-11: 30 mg via SUBCUTANEOUS
  Filled 2011-09-11 (×4): qty 0.3

## 2011-09-11 MED ORDER — ALLOPURINOL 100 MG PO TABS
100.0000 mg | ORAL_TABLET | Freq: Every day | ORAL | Status: DC
  Administered 2011-09-11: 100 mg via ORAL
  Filled 2011-09-11 (×3): qty 1

## 2011-09-11 MED ORDER — DEXTROSE 5 % IV SOLN
1.0000 g | Freq: Every day | INTRAVENOUS | Status: DC
  Administered 2011-09-11: 1 g via INTRAVENOUS
  Filled 2011-09-11: qty 10

## 2011-09-11 MED ORDER — METHOCARBAMOL 500 MG PO TABS: 500.0000 mg | ORAL_TABLET | Freq: Four times a day (QID) | ORAL | Status: DC | PRN

## 2011-09-11 MED ORDER — ENOXAPARIN SODIUM 30 MG/0.3ML ~~LOC~~ SOLN
30.0000 mg | SUBCUTANEOUS | Status: DC
  Filled 2011-09-11: qty 0.3

## 2011-09-11 MED ORDER — DOCUSATE SODIUM 100 MG PO CAPS
100.0000 mg | ORAL_CAPSULE | Freq: Two times a day (BID) | ORAL | Status: DC
  Administered 2011-09-11: 100 mg via ORAL
  Filled 2011-09-11 (×5): qty 1

## 2011-09-11 MED ORDER — SODIUM CHLORIDE 0.9 % IJ SOLN
10.0000 mL | INTRAMUSCULAR | Status: DC | PRN
  Administered 2011-09-11 – 2011-09-30 (×23): 10 mL
  Filled 2011-09-11: qty 30

## 2011-09-11 MED ORDER — PHYTONADIONE 5 MG PO TABS
5.0000 mg | ORAL_TABLET | Freq: Once | ORAL | Status: AC
  Administered 2011-09-11: 5 mg via ORAL
  Filled 2011-09-11: qty 1

## 2011-09-11 MED ORDER — DILTIAZEM HCL ER COATED BEADS 300 MG PO CP24
300.0000 mg | ORAL_CAPSULE | Freq: Every day | ORAL | Status: DC
  Administered 2011-09-11: 300 mg via ORAL
  Filled 2011-09-11 (×4): qty 1

## 2011-09-11 MED ORDER — SACCHAROMYCES BOULARDII 250 MG PO CAPS
250.0000 mg | ORAL_CAPSULE | Freq: Two times a day (BID) | ORAL | Status: DC
  Administered 2011-09-11 (×2): 250 mg via ORAL
  Filled 2011-09-11 (×8): qty 1

## 2011-09-11 NOTE — Progress Notes (Addendum)
Subjective: "I feel ok" Pt alert, confused NAD.   Objective: Vital signs Filed Vitals:   09/10/11 2316 09/10/11 2340 09/11/11 0004 09/11/11 0537  BP: 128/77 128/77 148/65 114/69  Pulse: 73  74 72  Temp: 98.2 F (36.8 C)  98.2 F (36.8 C) 98.4 F (36.9 C)  TempSrc: Oral  Oral Oral  Resp: 18 18 20 20   Height:   5\' 5"  (1.651 m)   Weight:   120.7 kg (266 lb 1.5 oz)   SpO2: 100% 99% 100% 100%   Weight change:  Last BM Date: 09/10/11  Intake/Output from previous day: 02/25 0701 - 02/26 0700 In: -  Out: 175 [Urine:175]     Physical Exam: General: Alert, awake, oriented to self, in no acute distress. Weak HEENT: No bruits, no goiter. PERRL Heart: Regular rate and rhythm, without murmurs, rubs, gallops. Lungs: Slight increased work of breathing with conversation. Breath sounds distant but clear to auscultation bilaterally. Abdomen:Obese Soft, nontender, nondistended, positive bowel sounds but sluggish. Extremities: No clubbing cyanosis  with positive pedal pulses. MOE albeit minimally Neuro: speech clear, cranial nerve II-XII grossly intact    Lab Results: Basic Metabolic Panel:  Basename 09/10/11 2000  NA 137  K 4.1  CL 102  CO2 26  GLUCOSE 97  BUN 56*  CREATININE 5.16*  CALCIUM 12.7*  MG --  PHOS --   Liver Function Tests:  Basename 09/10/11 2000  AST 24  ALT 9  ALKPHOS 103  BILITOT 0.2*  PROT 5.5*  ALBUMIN 1.8*   No results found for this basename: LIPASE:2,AMYLASE:2 in the last 72 hours No results found for this basename: AMMONIA:2 in the last 72 hours CBC:  Basename 09/10/11 2000  WBC 13.4*  NEUTROABS 8.8*  HGB 8.3*  HCT 25.6*  MCV 86.8  PLT 423*   Cardiac Enzymes: No results found for this basename: CKTOTAL:3,CKMB:3,CKMBINDEX:3,TROPONINI:3 in the last 72 hours BNP: No results found for this basename: PROBNP:3 in the last 72 hours D-Dimer: No results found for this basename: DDIMER:2 in the last 72 hours CBG: No results found for this  basename: GLUCAP:6 in the last 72 hours Hemoglobin A1C: No results found for this basename: HGBA1C in the last 72 hours Fasting Lipid Panel: No results found for this basename: CHOL,HDL,LDLCALC,TRIG,CHOLHDL,LDLDIRECT in the last 72 hours Thyroid Function Tests: No results found for this basename: TSH,T4TOTAL,FREET4,T3FREE,THYROIDAB in the last 72 hours Anemia Panel:  Basename 09/11/11 0210  VITAMINB12 --  FOLATE --  FERRITIN --  TIBC --  IRON --  RETICCTPCT 2.4   Coagulation:  Basename 09/11/11 0825 09/10/11 2000  LABPROT 19.4* 20.1*  INR 1.61* 1.68*   Urine Drug Screen: Drugs of Abuse  No results found for this basename: labopia, cocainscrnur, labbenz, amphetmu, thcu, labbarb    Alcohol Level: No results found for this basename: ETH:2 in the last 72 hours Urinalysis:  Basename 09/10/11 2253  COLORURINE YELLOW  LABSPEC 1.014  PHURINE 6.0  GLUCOSEU 100*  HGBUR SMALL*  BILIRUBINUR NEGATIVE  KETONESUR NEGATIVE  PROTEINUR 100*  UROBILINOGEN 0.2  NITRITE NEGATIVE  LEUKOCYTESUR NEGATIVE   Misc. Labs:  Recent Results (from the past 240 hour(s))  GRAM STAIN     Status: Normal   Collection Time   09/03/11  8:08 AM      Component Value Range Status Comment   Specimen Description FLUID SYNOVIAL RIGHT HIP ON SWAB   Final    Special Requests NONE   Final    Gram Stain     Final  Value: ABUNDANT WBC PRESENT, PREDOMINANTLY PMN     NO ORGANISMS SEEN     Gram Stain Report Called to,Read Back By and Verified With:     DAVENPORT,S. RN AT [id number] ON 09/03/11 BY GILLESPIE,B.   Report Status 09/03/2011 FINAL   Final   BODY FLUID CULTURE     Status: Normal   Collection Time   09/03/11  8:09 AM      Component Value Range Status Comment   Specimen Description FLUID SYNOVIAL RIGHT HIP ON SWAB   Final    Special Requests NONE   Final    Gram Stain     Final    Value: ABUNDANT WBC PRESENT, PREDOMINANTLY PMN     NO ORGANISMS SEEN     Gram Stain Report Called to,Read Back By and  Verified With: Gram Stain Report Called to,Read Back By and Verified With: [id number] S RN @ [phone] ON 09/03/11 BY Ethel Rana B Performed by [facility]   Culture     Final    Value: RARE PSEUDOMONAS AERUGINOSA     Note: CRITICAL RESULT CALLED TO, READ BACK BY AND VERIFIED WITH: KIMBERLY OWENS   Report Status 09/06/2011 FINAL   Final    Organism ID, Bacteria PSEUDOMONAS AERUGINOSA   Final   ANAEROBIC CULTURE     Status: Normal   Collection Time   09/03/11  8:10 AM      Component Value Range Status Comment   Specimen Description FLUID SYNOVIAL RIGHT HIP   Final    Special Requests NONE   Final    Gram Stain     Final    Value: NO WBC SEEN     NO SQUAMOUS EPITHELIAL CELLS SEEN     NO ORGANISMS SEEN   Culture NO ANAEROBES ISOLATED   Final    Report Status 09/07/2011 FINAL   Final   MRSA PCR SCREENING     Status: Normal   Collection Time   09/11/11 12:48 AM      Component Value Range Status Comment   MRSA by PCR NEGATIVE  NEGATIVE  Final     Studies/Results: Dg Chest 1 View  09/10/2011  *RADIOLOGY REPORT*  Clinical Data: Altered mental status.  CHEST - 1 VIEW  Comparison: 09/04/2011  Findings: The patient has a right-sided PICC line, tip to the superior vena cava.  The heart is enlarged.  There is dense opacity at the left lung base which partially obscures the lateral hemidiaphragm.  Findings are consistent with infectious infiltrate. No evidence for pulmonary edema.  No evidence for pneumothorax. There is degenerative change in the spine.  IMPRESSION:  1.  Cardiomegaly without pulmonary edema. 2.  Left lower lobe infiltrate.  Original Report Authenticated By: Glenice Bow, M.D.   Ct Head Wo Contrast  09/10/2011  *RADIOLOGY REPORT*  Clinical Data: Confusion, weakness, previous stroke.  CT HEAD WITHOUT CONTRAST  Technique:  Contiguous axial images were obtained from the base of the skull through the vertex without contrast.  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 Atherosclerotic and physiologic intracranial calcifications.  Mild atrophy . There is no evidence of acute intracranial hemorrhage, brain edema, mass lesion, acute infarction,   mass effect, or midline shift. Acute infarct may be inapparent on noncontrast CT.  No other intra-axial abnormalities are seen, and the ventricles and sulci are within normal limits in size and symmetry.   No abnormal extra-axial fluid collections or masses are identified.  No  significant calvarial abnormality.  IMPRESSION: 1. Negative for bleed or other acute intracranial process.  Original Report Authenticated By: Trecia Rogers, M.D.    Medications: Scheduled Meds:   . allopurinol  100 mg Oral Daily  . ceFEPime (MAXIPIME) IV  1 g Intravenous Once  . cefTRIAXone (ROCEPHIN)  IV  1 g Intravenous QHS  . diltiazem  300 mg Oral QAC breakfast  . docusate sodium  100 mg Oral BID  . enoxaparin (LOVENOX) injection  30 mg Subcutaneous Q24H  . HYDROcodone-acetaminophen  1-2 tablet Oral Q4H  . levofloxacin (LEVAQUIN) IV  500 mg Intravenous Q48H  . levofloxacin (LEVAQUIN) IV  750 mg Intravenous Once  . metoprolol succinate  100 mg Oral QAC breakfast  . phytonadione  5 mg Oral Once  . saccharomyces boulardii  250 mg Oral BID  . sertraline  50 mg Oral QAC breakfast  . sodium chloride  1,000 mL Intravenous Once  . sotalol  80 mg Oral BID  . vancomycin  2,000 mg Intravenous Once  . DISCONTD: rivaroxaban  10 mg Oral Daily   Continuous Infusions:   . sodium chloride 1,000 mL (09/10/11 2136)   PRN Meds:.methocarbamol, nystatin, sodium chloride  Assessment/Plan:  Active Problems:  Morbid obesity  HYPERTENSION  Atrial fibrillation  ARRHYTHMIA, HX OF  Acute-on-chronic kidney injury  Dehydration  Pneumonia  Gout  1.hospital-acquired Pneumonia Admit to telemetry. Vanc/Levaquin/Rocephin day #2.Oxygen keep sats greater than 88% Nebulizers as needed  Sputum cultures and blood  cultures pending. Afebrile, non-toxic appearing   2.Acute and chronic kidney disease ContinueIV fluid hydration and foley. monitor I.'s and O.'s .Holding Lasix 80 mg twice daily and daily aspirin .Likely increased creatinine due to high dose Lasix, hopefully would decrease with IV hydration .Care for fluid overload  Allopurinol, colchicine, neurontin d/ced. Adjust and renally dose if needed. Will call renal consult  3. Hypercalcemia : parathyroid hormone, PTH related hormone, Vit D metabolite metbolite, TSH pending. Continue IVF.  ?occult malignancy - tumor markers ordered  Consider Pamidronate if Ca does not improve with IVF hydration    3.Atrial fibrillation :   Rate controlled. Monitor on tele. Continue diltiazem  4 HYPERTENSION: controlled 5. Morbid obesity  6.Obstructive sleep apnea  Stable resume home medications and CPAP machine  7.Chronic anemia  Basically unchanged, will order an anemia panel and type and screen  8.Elevated INR  Unclear etiology  Single dose VIT K  RUQ [name]   9. Hx rt hip hardware removal secondary infection. Husband reports pt rehabbing at [facility] and is non weight bearing per Dr. Alvan Dame. Will request PT/OT    LOS: 1 day   [facility] M 09/11/2011, 9:04 AM  Patient awake, still confuse, she knows that she is at [facility].  Agree with above. Will treat for health care associated PNA, with vancomycin , cefepime, Levaquin, patient was just discharge from hospital. Patient was on xarelto, unclear if this increase INR. She will need DVT prophylaxis. Continue with Lovenox. Will consult renal. Hypercalcemia probably related to dehydration, will defer to renal SPEP, UPEP.  Late entrance"; 2-28:  Patient with encephalopathy, present with confusion.  Morbid obesity, BMI 44.

## 2011-09-11 NOTE — Progress Notes (Signed)
PHARMACIST - PHYSICIAN COMMUNICATION DR:    CONCERNING:  xarelto-- and the patient's poor renal function (CrCl < 20)   RECOMMENDATION: Stop xarelto due to reduced renal function, start lovenox for DVT prophylaxis (dose adjusted for renal function)  DESCRIPTION: In discussion with MD Broadus John and her direction I carried out the above recommendations.

## 2011-09-11 NOTE — Clinical Documentation Improvement (Signed)
CHANGE MENTAL STATUS DOCUMENTATION CLARIFICATION   THIS DOCUMENT IS NOT A PERMANENT PART OF THE MEDICAL RECORD  TO RESPOND TO THE THIS QUERY, FOLLOW THE INSTRUCTIONS BELOW:  1. If needed, update documentation for the patient's encounter via the notes activity.  2. Access this query again and click edit on the In [employer].  3. After updating, or not, click F2 to complete all highlighted (required) fields concerning your review. Select "additional documentation in the medical record" OR "no additional documentation provided".  4. Click Sign note button.  5. The deficiency will fall out of your In Basket *Please let us know if you are not able to complete this workflow by phone or e-mail (listed below).         09/11/11  Dear Dr. [facility]  In an effort to better capture your patient's severity of illness, reflect appropriate length of stay and utilization of resources, a review of the patient medical record has revealed the following indicators.    Based on your clinical judgment, please clarify and document in a progress note and/or discharge summary the clinical condition associated with the following supporting information:  In responding to this query please exercise your independent judgment.  The fact that a query is asked, does not imply that any particular answer is desired or expected.  09/10/11 ED rec, pt admitted from SNF with "AMS" & confusion. 09/11/11 Prog note..."I feel ok" Pt alert, confused NAD".Marland KitchenMarland KitchenFor accurate DX specificity & severity, can "AMS" be further clarified. Thank you   Possible Clinical Conditions?  _______Encephalopathy (describe type if known)                       Anoxic                       Septic                       Alcoholic                        Hepatic                       Hypertensive                       Metabolic                       _______ Toxic _______Traumatic brain injury _______Traumatic brain  hemorrhage _______Drug induced confusion/delirium  _______Acute confusion _______Acute delirium _______Acute exacerbation of known dementia (indicate type) _______New diagnosis of Dementia, Alzheimer's, cerebral atherosclerosis  _______Hyponatremia / Hypernatremia _______Poisoning / Overdose _______Hypoxemia / Hypoxia _______Other Condition__________________ _______Cannot Clinically Determine   Supporting Information: Risk Factors: see note below  Signs & Symptoms: 09/10/11 ED rec, pt admitted from SNF with "AMS" & confusion. 09/11/11 Prog note..."I feel ok" Pt alert, confused NAD"...  Diagnostics: Lab:  Radiology: CT scan: 09/10/11: Head CT: IMPRESSION: 1. Negative for bleed or other acute intracranial process.  Treatment: 09/11/11: Cont abx, mon labs, meds adjusted, Renal consult   Reviewed: additional documentation in the MEDICAL RECORD NUMBER2/27/13 add doc by md>closed. ORM  Thank You,  Ermelinda Das, RN, BSN, [place] Certified Clinical Documentation Specialist Pager: [place]

## 2011-09-11 NOTE — Consult Note (Signed)
[name] 09/11/2011 Samyuktha Brau D Requesting Physician:  Dr. Tyrell Antonio  Reason for Consult:  Acute on chronic renal failure HPI: The patient is a 66 y.o. 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  Had antibiotic spacer placed, was d/c'd to [facility] on 2/21 with creat 2.3.  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  She presented with AMS to ED from SNF yesterday.  Creat was up at 5.1 and pt admitted by [facility] service.  CXR showed obscured L hemidiaphragm and IV abx were started for possible HCAP.     Currently patient is drowsy, confused and slurring her words.  No specific complaints, no CP or SOB.    Creatinine, Ser  Date/Time Value Range Status  09/11/2011  8:25 AM 5.32* 0.50-1.10 (mg/dL) Final  09/10/2011  8:00 PM 5.16* 0.50-1.10 (mg/dL) Final  09/06/2011  8:10 AM 2.34* 0.50-1.10 (mg/dL) Final  09/05/2011  4:10 AM 2.46* 0.50-1.10 (mg/dL) Final  09/04/2011  3:55 AM 2.23* 0.50-1.10 (mg/dL) Final  09/03/2011 11:11 AM 1.88* 0.50-1.10 (mg/dL) Final  08/28/2011 11:20 AM 2.01* 0.50-1.10 (mg/dL) Final  06/11/2011  6:09 AM 2.21* 0.50-1.10 (mg/dL) Final  06/10/2011  5:00 AM 2.28* 0.50-1.10 (mg/dL) Final  06/09/2011  5:30 AM 2.07* 0.50-1.10 (mg/dL) Final  06/08/2011  4:50 AM 1.89* 0.50-1.10 (mg/dL) Final  06/07/2011  5:10 AM 1.96* 0.50-1.10 (mg/dL) Final  06/06/2011  9:30 PM 1.99* 0.50-1.10 (mg/dL) Final  06/06/2011  7:00 PM 2.00* 0.50-1.10 (mg/dL) Final  06/06/2011 11:00 AM 2.20* 0.50-1.10 (mg/dL) Final  05/09/2011  5:35 AM 1.56* 0.50-1.10 (mg/dL) Final  05/08/2011  5:10 AM 1.72* 0.50-1.10 (mg/dL) Final  05/07/2011  6:29 AM 1.64* 0.50-1.10 (mg/dL) Final  05/06/2011  3:00 AM 1.56* 0.50-1.10 (mg/dL) Final  05/04/2011  5:30 AM 2.04* 0.50-1.10 (mg/dL) Final  05/03/2011  5:20 AM 2.00* 0.50-1.10 (mg/dL) Final  05/02/2011   5:15 AM 1.88* 0.50-1.10 (mg/dL) Final  05/01/2011  5:35 AM 1.82* 0.50-1.10 (mg/dL) Final  04/18/2011  4:15 AM 2.51* 0.50-1.10 (mg/dL) Final  04/17/2011 12:55 PM 2.24* 0.50-1.10 (mg/dL) Final  04/16/2011  2:49 PM 1.76* 0.50-1.10 (mg/dL) Final  04/04/2011  4:41 AM 2.55* 0.50-1.10 (mg/dL) Final  04/03/2011  5:46 AM 2.32* 0.50-1.10 (mg/dL) Final  04/02/2011  4:43 AM 2.16* 0.50-1.10 (mg/dL) Final  02/28/2011  4:55 AM 2.26* 0.50-1.10 (mg/dL) Final  02/27/2011  4:49 PM 2.41* 0.50-1.10 (mg/dL) Final  02/27/2011  4:33 AM 2.22* 0.50-1.10 (mg/dL) Final  02/19/2011 10:55 AM 1.97* 0.50-1.10 (mg/dL) Final  10/05/2009  5:48 AM 2.62* 0.4-1.2 (mg/dL) Final  10/04/2009  5:19 PM 3.33* 0.4-1.2 (mg/dL) Final  10/04/2009 11:43 AM 3.3* 0.4-1.2 (mg/dL) Final  05/12/2009 10:50 AM 1.87* 0.4-1.2 (mg/dL) Final  11/17/2008  8:45 AM 2.37* 0.4-1.2 (mg/dL) Final  11/16/2008  5:20 AM 3.28* 0.4-1.2 (mg/dL) Final  11/15/2008  6:55 PM 3.87* 0.4-1.2 (mg/dL) Final  09/21/2008  7:30 PM 1.45* 0.4-1.2 (mg/dL) Final    Past Medical History:  Past Medical History  Diagnosis Date  . Hypertension   . Depression   . Fractures, stress     in both feet  . Kidney disease     pt states creatinine is high  . 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 Stroke   . Gout   . Asthma     MILD NOW-NO INHALERS  . Sleep apnea  USES CPAP  . Anemia   . Blood transfusion   . Arthritis     HIP  . Atrial fibrillation     PT TAKES ASPIRIN AS BLOOD THINNER-HX OF CEREBRAL BLEED WHILE ON COUMADIN  . Arrhythmia   . Cardiomyopathy   . Hypercalcemia     09/10/11  . HCAP (healthcare-associated pneumonia)     09/10/11    Past Surgical History:  Past Surgical History  Procedure Date  . Cholecystectomy   . 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 Joint replacement     right hip and right femur x 2  . Right femur fracture x2     FRACTURES WERE AFTER AFTER RIGHT HIP REPLACEMENT    Family History:  Family History  Problem  Relation Age of Onset  . Diabetes Mother   . Hypertension Mother   . Hodgkin's lymphoma  32    dscd---HODGKINS DISEASE  . Cancer Father    Social History:  reports that she has never smoked. She has never used smokeless tobacco. She reports that she does not drink alcohol or use illicit drugs.  Allergies:  Allergies  Allergen Reactions  . Coumadin Other (See Comments)    Caused her to have a stroke    Home medications: Prior to Admission medications   Medication Sig Start Date End Date Taking? Authorizing Provider  colchicine 0.6 MG tablet Take 1 tablet (0.6 mg total) by mouth daily. 06/11/11 06/10/12 Yes Irine Seal, MD  diltiazem (CARDIZEM CD) 300 MG 24 hr capsule Take 300 mg by mouth daily before breakfast.    Yes Historical Provider, MD  diphenhydrAMINE (BENADRYL) 25 MG tablet Take 25 mg by mouth every 6 (six) hours as needed. Itching/rash   Yes Historical Provider, MD  docusate sodium 100 MG CAPS Take 100 mg by mouth 2 (two) times daily. 09/06/11 09/16/11 Yes Lucille Passy Babish, PA  doxercalciferol (HECTOROL) 0.5 MCG capsule Take 0.5 mcg by mouth daily before breakfast.    Yes Historical Provider, MD  ferrous sulfate 325 (65 FE) MG tablet Take 325 mg by mouth 3 (three) times daily with meals.    Yes Historical Provider, MD  furosemide (LASIX) 80 MG tablet Take 80-160 mg by mouth 2 (two) times daily. 2 tab in am and 1 tab   Yes Historical Provider, MD  gabapentin (NEURONTIN) 300 MG capsule Take 300 mg by mouth 3 (three) times daily.    Yes Historical Provider, MD  HYDROcodone-acetaminophen (NORCO) 7.5-325 MG per tablet Take 1-2 tablets by mouth every 4 (four) hours. 09/06/11 09/16/11 Yes Lucille Passy Babish, PA  methocarbamol (ROBAXIN) 500 MG tablet Take 1 tablet (500 mg total) by mouth every 6 (six) hours as needed (muscle spasms). 09/06/11 09/16/11 Yes Lucille Passy Babish, PA  metoprolol (TOPROL XL) 100 MG 24 hr tablet Take 100 mg by mouth daily before breakfast.    Yes Historical  Provider, MD  Multiple Vitamins-Minerals (MULTIVITAMINS THER. W/MINERALS) TABS Take 1 tablet by mouth daily.    Yes Historical Provider, MD  nystatin (NYSTOP) 100000 UNIT/GM POWD Apply 1 g topically daily as needed. Rash    06/11/11  Yes Irine Seal, MD  polyethylene glycol [facility] / GLYCOLAX) packet Take 17 g by mouth 2 (two) times daily. 09/06/11 10/11/11 Yes Lucille Passy Babish, PA  potassium chloride SA (KLOR-CON M20) 20 MEQ tablet Take 20 mEq by mouth daily.    Yes Historical Provider, MD  rivaroxaban (XARELTO) 10 MG TABS tablet  Take 1 tablet (10 mg total) by mouth daily. 09/06/11  Yes Lucille Passy Babish, PA  saccharomyces boulardii (FLORASTOR) 250 MG capsule Take 1 capsule (250 mg total) by mouth 2 (two) times daily. 09/06/11 09/13/11 Yes Lucille Passy Babish, PA  sertraline (ZOLOFT) 50 MG tablet Take 1 tablet (50 mg total) by mouth daily before breakfast. 08/28/11  Yes Rosalita Chessman, DO  sotalol (BETAPACE) 80 MG tablet Take 80 mg by mouth 2 (two) times daily.    Yes Historical Provider, MD    Inpatient medications:    . allopurinol  100 mg Oral Daily  . ceFEPime (MAXIPIME) IV  1 g Intravenous Once  . ceFEPime (MAXIPIME) IV  1 g Intravenous Q24H  . diltiazem  300 mg Oral QAC breakfast  . docusate sodium  100 mg Oral BID  . enoxaparin (LOVENOX) injection  30 mg Subcutaneous Daily  . HYDROcodone-acetaminophen  1-2 tablet Oral Q4H  . levofloxacin (LEVAQUIN) IV  500 mg Intravenous Q48H  . levofloxacin (LEVAQUIN) IV  750 mg Intravenous Once  . metoprolol succinate  100 mg Oral QAC breakfast  . phytonadione  5 mg Oral Once  . saccharomyces boulardii  250 mg Oral BID  . sertraline  50 mg Oral QAC breakfast  . sodium chloride  1,000 mL Intravenous Once  . sotalol  80 mg Oral BID  . vancomycin  2,000 mg Intravenous Once  . DISCONTD: cefTRIAXone (ROCEPHIN)  IV  1 g Intravenous QHS  . DISCONTD: enoxaparin (LOVENOX) injection  30 mg Subcutaneous Q24H  . DISCONTD: rivaroxaban  10 mg  Oral Daily    Review of Systems Gen:  Denies headache, fever, chills, sweats.  No weight loss. HEENT:  No visual change, sore throat, difficulty swallowing. Resp:  No difficulty breathing, DOE.  No cough or hemoptysis. Cardiac:  No chest pain, orthopnea, PND.  + edema R leg after recent surgery. GI:   Denies abdominal pain.   No nausea, vomiting, diarrhea.  No constipation. GU:  Denies difficulty or change in voiding.  No change in urine color.     MS:  Denies joint pain or swelling.   Derm:  Denies skin rash or itching.  No chronic skin conditions.  Neuro:   Hx IC bleed in the past.   Psych:  Denies symptoms of depression of anxiety.  No hallucination.    Physical Exam:  Blood pressure 110/65, pulse 72, temperature 97.7 F (36.5 C), temperature source Oral, resp. rate 19, height 5\' 5"  (1.651 m), weight 120.7 kg (266 lb 1.5 oz), SpO2 96.00%.  Gen: obese BF, in bed, drowsy, slurred speech, gross myoclonic jerking periodically, esp with movement Skin: no rash, cyanosis Neck: no JVD, bruits or LAN Chest: clear bilat, no rales, wheezes or distress Heart: regular, no rub or gallop Abdomen: soft, markedly obese, no ascites or masses, nontender Ext: 1+ edema of L leg, 2+ edema of RLE, R hip wound long, stapled, no active drainage, mild erythema Neuro: no  Focal deficit, see also above Heme/Lymph: no bruising or LAN  Labs: Basic Metabolic Panel:  Lab [id number] 09/10/11 2000 09/06/11 0810 09/05/11 0410  NA 137 137 130* 131*  K 4.0 4.1 4.1 4.4  CL 105 102 99 99  CO2 24 26 25 25   GLUCOSE 90 97 109* 87  BUN 56* 56* 44* 41*  CREATININE 5.32* 5.16* 2.34* 2.46*  ALB -- -- -- --  CALCIUM 11.9* 12.7* 10.7* 8.5  PHOS -- -- -- --   Liver Function Tests:  Lab 09/10/11 2000  AST 24  ALT 9  ALKPHOS 103  BILITOT 0.2*  PROT 5.5*  ALBUMIN 1.8*   No results found for this basename: LIPASE:3,AMYLASE:3 in the last 168 hours No results found for this basename: AMMONIA:3 in the last 168  hours CBC:  Lab 09/10/11 2000 09/05/11 0410  WBC 13.4* 13.2*  NEUTROABS 8.8* --  HGB 8.3* 9.7*  HCT 25.6* 29.5*  MCV 86.8 85.8  PLT 423* 254   PT/INR: @labrcntip (inr:5) Cardiac Enzymes: No results found for this basename: CKTOTAL:5,CKMB:5,CKMBINDEX:5,TROPONINI:5 in the last 168 hours CBG:  Lab 09/05/11 2152  GLUCAP 103*    Iron Studies:  Lab 09/11/11 0210  IRON 35*  TIBC 122*  TRANSFERRIN --  FERRITIN 218    Xrays/Other Studies: Dg Chest 1 View  09/10/2011  *RADIOLOGY REPORT*  Clinical Data: Altered mental status.  CHEST - 1 VIEW  Comparison: 09/04/2011  Findings: The patient has a right-sided PICC line, tip to the superior vena cava.  The heart is enlarged.  There is dense opacity at the left lung base which partially obscures the lateral hemidiaphragm.  Findings are consistent with infectious infiltrate. No evidence for pulmonary edema.  No evidence for pneumothorax. There is degenerative change in the spine.  IMPRESSION:  1.  Cardiomegaly without pulmonary edema. 2.  Left lower lobe infiltrate.  Original Report Authenticated By: Glenice Bow, M.D.   Ct Head Wo Contrast  09/10/2011  *RADIOLOGY REPORT*  Clinical Data: Confusion, weakness, previous stroke.  CT HEAD WITHOUT CONTRAST  Technique:  Contiguous axial images were obtained from the base of the skull through the vertex without contrast.  Comparison: 09/23/2008  Findings: The previously noted right thalamic hemorrhage has resolved. Atherosclerotic and physiologic intracranial calcifications.  Mild atrophy . There is no evidence of acute intracranial hemorrhage, brain edema, mass lesion, acute infarction,   mass effect, or midline shift. Acute infarct may be inapparent on noncontrast CT.  No other intra-axial abnormalities are seen, and the ventricles and sulci are within normal limits in size and symmetry.   No abnormal extra-axial fluid collections or masses are identified.  No significant calvarial abnormality.   IMPRESSION: 1. Negative for bleed or other acute intracranial process.  Original Report Authenticated By: Trecia Rogers, M.D.   US Abdomen Complete  09/11/2011  *RADIOLOGY REPORT*  Clinical Data:  Elevated INR, liver disease, morbid obesity, prior cholecystectomy  ULTRASOUND ABDOMEN:  Technique:  Sonography of upper abdominal structures was performed. Examination limited by body habitus and bowel gas.  Comparison:  Renal ultrasound 09/03/2006  Gallbladder:  Surgically absent  Common bile duct:  8 mm diameter, may be normal post cholecystectomy  Liver:  Normal appearance  IVC:  Normal appearance  Pancreas:  Suboptimally visualized due to bowel gas, with portions of head and tail obscured.  Visualized portions normal appearance.  Spleen:  Normal appearance, 5.9 cm length  Right kidney:  10.8 cm length.  Increased cortical echogenicity. Small cyst 1.1 x 1.2 x 1.0 cm.  No gross hydronephrosis or shadowing calcification.  Left kidney:  Suboptimally visualized.  Approximately 10.7 cm length.  Marked cortical thinning.  Increased cortical echogenicity.  No gross mass or hydronephrosis seen on limited assessment.  Aorta:  Proximally normal caliber, remainder obscured by bowel gas.  Other:  No free fluid  IMPRESSION: Medical renal disease changes of both kidneys with cortical atrophy of left kidney. Post cholecystectomy with slightly dilated common bile duct 8 mm diameter, which may be physiologic; recommend correlation with  LFTs. Incomplete visualization of pancreas, aorta and left kidney.  Original Report Authenticated By: Burnetta Sabin, M.D.    Assessment/Plan 1. Acute on CRF, unclear cause.  UA shows mild microhemautria, prot 100.  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  Check urine Na.  She is suffering either from gross uremia or medication side effects or both, currently with myoclonic jerking, somnolence.  Will d/c all potentitally sedating  meds (prn robaxin, hydrocodone, zoloft) and request [facility] MD to transfer pt to [facility] in case of need for RRT in the next 24 hrs if she is not getting any better.  Agree with cautious IVF's, she does have some leg edema, watch for signs of fluid overload.  Avoid all narcotics, neurontin and any other potentially sedating meds tonight.  2. S/P recent removal of hardware due to infected THR joint (R) 3. HTN 4. Hx IC bleed 5. Afib on sotalol, BB 6. CKD baseline creat 2.3, was on 80-160 po lasix bid at home 7. Gout on allopurinol, reduced dose is appropriate 8. Probable PNA, LLL- on IV abx per primary  Kelly Splinter  MD [facility] [phone] pgr    [phone] cell 09/11/2011, 3:41 PM

## 2011-09-11 NOTE — H&P (Signed)
Report called to [facility] on [facility] [address]. [facility] called for transport. Callie Fielding RN

## 2011-09-11 NOTE — Clinical Documentation Improvement (Signed)
BMI DOCUMENTATION CLARIFICATION QUERY  THIS DOCUMENT IS NOT A PERMANENT PART OF THE MEDICAL RECORD  TO RESPOND TO THE THIS QUERY, FOLLOW THE INSTRUCTIONS BELOW:  1. If needed, update documentation for the patient's encounter via the notes activity.  2. Access this query again and click edit on the In [employer].  3. After updating, or not, click F2 to complete all highlighted (required) fields concerning your review. Select "additional documentation in the medical record" OR "no additional documentation provided".  4. Click Sign note button.  5. The deficiency will fall out of your In Basket *Please let us know if you are not able to complete this workflow by phone or e-mail (listed below).         09/11/11  Dear Dr. [facility]  In an effort to better capture your patient's severity of illness, reflect appropriate length of stay and utilization of resources, a review of the patient medical record has revealed the following indicators.    Based on your clinical judgment, please clarify and document in a progress note and/or discharge summary the clinical condition associated with the following supporting information:  In responding to this query please exercise your independent judgment.  The fact that a query is asked, does not imply that any particular answer is desired or expected.  09/11/11 Progr note..." 5. Morbid obesity", Nursing Flowsheet, BMI=44.4. For accurate DX specificity & severity, can noted condition be linked to clinical info per nursing flowsheet documentation. Thank you  Possible Clinical conditions:  Morbid Obesity W/ BMI= 44.4  Underweight w/BMI=  Other condition___________________  Cannot Clinically determine _____________   Clinical Information: Risk Factors: 09/11/11 prog note.Marland KitchenMarland Kitchen"Hx rt hip hardware removal secondary infection. Husband reports pt rehabbing at [facility] and is non weight bearing ..."  Signs & Symptoms:09/11/11: Weight: 266 lb  1.5 oz (120.7 kg) Height: 5\' 5"  (165.1 cm) BMI = 44.4  Diagnostics:  Treatment cont to mon  Nutrition Note: none ordered  Nursing Note: see nursing VitalSsigns flowsheet   Reviewed:  no additional documentation provided: 09/13/11 no add doc by md>closed. ORM  Thank You,  Ermelinda Das, RN, BSN, [place] Certified Clinical Documentation Specialist Pager: [place]

## 2011-09-11 NOTE — Progress Notes (Addendum)
Fanny Bien paged in reference to VTE and diet order.

## 2011-09-12 ENCOUNTER — Inpatient Hospital Stay (HOSPITAL_COMMUNITY): Payer: Medicare Other

## 2011-09-12 DIAGNOSIS — J9600 Acute respiratory failure, unspecified whether with hypoxia or hypercapnia: Secondary | ICD-10-CM

## 2011-09-12 DIAGNOSIS — G9340 Encephalopathy, unspecified: Secondary | ICD-10-CM

## 2011-09-12 DIAGNOSIS — N179 Acute kidney failure, unspecified: Secondary | ICD-10-CM

## 2011-09-12 LAB — BASIC METABOLIC PANEL
BUN: 42 mg/dL — ABNORMAL HIGH (ref 6–23)
CO2: 22 mEq/L (ref 19–32)
Calcium: 11 mg/dL — ABNORMAL HIGH (ref 8.4–10.5)
Chloride: 104 mEq/L (ref 96–112)
Creatinine, Ser: 4.64 mg/dL — ABNORMAL HIGH (ref 0.50–1.10)
GFR calc Af Amer: 11 mL/min — ABNORMAL LOW (ref >90)
GFR calc non Af Amer: 9 mL/min — ABNORMAL LOW (ref >90)
Glucose, Bld: 77 mg/dL (ref 70–99)
Potassium: 3.5 mEq/L (ref 3.5–5.1)
Sodium: 139 mEq/L (ref 135–145)

## 2011-09-12 LAB — BLOOD GAS, ARTERIAL
Acid-base deficit: 5.2 mmol/L — ABNORMAL HIGH (ref 0.0–2.0)
Bicarbonate: 20.8 mEq/L (ref 20.0–24.0)
Drawn by: 213381
FIO2: 0.21 %
O2 Saturation: 93.6 %
Patient temperature: 98.6
TCO2: 22.2 mmol/L (ref 0–100)
pCO2 arterial: 48.1 mmHg — ABNORMAL HIGH (ref 35.0–45.0)
pH, Arterial: 7.258 — ABNORMAL LOW (ref 7.350–7.400)
pO2, Arterial: 68.1 mmHg — ABNORMAL LOW (ref 80.0–100.0)

## 2011-09-12 LAB — POCT I-STAT 3, ART BLOOD GAS (G3+)
Acid-Base Excess: 1 mmol/L (ref 0.0–2.0)
Bicarbonate: 24.3 mEq/L — ABNORMAL HIGH (ref 20.0–24.0)
O2 Saturation: 100 %
Patient temperature: 97.8
TCO2: 25 mmol/L (ref 0–100)
pCO2 arterial: 31.1 mmHg — ABNORMAL LOW (ref 35.0–45.0)
pH, Arterial: 7.499 — ABNORMAL HIGH (ref 7.350–7.400)
pO2, Arterial: 453 mmHg — ABNORMAL HIGH (ref 80.0–100.0)

## 2011-09-12 LAB — CBC
HCT: 23.6 % — ABNORMAL LOW (ref 36.0–46.0)
Hemoglobin: 7.7 g/dL — ABNORMAL LOW (ref 12.0–15.0)
MCH: 28 pg (ref 26.0–34.0)
MCHC: 32.6 g/dL (ref 30.0–36.0)
MCV: 85.8 fL (ref 78.0–100.0)
Platelets: 283 10*3/uL (ref 150–400)
RBC: 2.75 MIL/uL — ABNORMAL LOW (ref 3.87–5.11)
RDW: 16.8 % — ABNORMAL HIGH (ref 11.5–15.5)
WBC: 13.4 10*3/uL — ABNORMAL HIGH (ref 4.0–10.5)

## 2011-09-12 LAB — HEPATITIS B SURFACE ANTIGEN: Hepatitis B Surface Ag: NEGATIVE

## 2011-09-12 LAB — RENAL FUNCTION PANEL
Albumin: 1.6 g/dL — ABNORMAL LOW (ref 3.5–5.2)
BUN: 62 mg/dL — ABNORMAL HIGH (ref 6–23)
CO2: 21 mEq/L (ref 19–32)
Calcium: 11.8 mg/dL — ABNORMAL HIGH (ref 8.4–10.5)
Chloride: 109 mEq/L (ref 96–112)
Creatinine, Ser: 5.85 mg/dL — ABNORMAL HIGH (ref 0.50–1.10)
GFR calc Af Amer: 8 mL/min — ABNORMAL LOW (ref >90)
GFR calc non Af Amer: 7 mL/min — ABNORMAL LOW (ref >90)
Glucose, Bld: 94 mg/dL (ref 70–99)
Phosphorus: 4.8 mg/dL — ABNORMAL HIGH (ref 2.3–4.6)
Potassium: 5.3 mEq/L — ABNORMAL HIGH (ref 3.5–5.1)
Sodium: 142 mEq/L (ref 135–145)

## 2011-09-12 LAB — URINE CULTURE
Colony Count: NO GROWTH
Culture  Setup Time: 201302260531
Culture: NO GROWTH

## 2011-09-12 LAB — GLUCOSE, CAPILLARY
Glucose-Capillary: 76 mg/dL (ref 70–99)
Glucose-Capillary: 81 mg/dL (ref 70–99)
Glucose-Capillary: 73 mg/dL (ref 70–99)

## 2011-09-12 LAB — VANCOMYCIN, RANDOM
Vancomycin Rm: 37.6 ug/mL
Vancomycin Rm: 31.8 ug/mL

## 2011-09-12 LAB — HEPATITIS PANEL, ACUTE
HCV Ab: NEGATIVE
Hep A IgM: NEGATIVE
Hep B C IgM: NEGATIVE
Hepatitis B Surface Ag: NEGATIVE

## 2011-09-12 LAB — HEPATITIS B SURFACE ANTIBODY,QUALITATIVE: Hep B S Ab: NEGATIVE

## 2011-09-12 LAB — PTH, INTACT AND CALCIUM
Calcium, Total (PTH): 11.1 mg/dL — ABNORMAL HIGH (ref 8.4–10.5)
PTH: 6.7 pg/mL — ABNORMAL LOW (ref 14.0–72.0)

## 2011-09-12 LAB — LACTIC ACID, PLASMA: Lactic Acid, Venous: 1.1 mmol/L (ref 0.5–2.2)

## 2011-09-12 LAB — PROCALCITONIN: Procalcitonin: 0.21 ng/mL

## 2011-09-12 IMAGING — CT CT HEAD W/O CM
1 series · 16 of 30 positions shown, 20 images · non-contrast
Comparison: [DATE] and earlier.

CLINICAL DATA: 65-year-old female with decreased level of
consciousness.

CT HEAD WITHOUT CONTRAST
TECHNIQUE: Contiguous axial images were obtained from the base of
the skull through the vertex without contrast.

[Series 2: head routine 4.8 h37s · axial · 0.47mm/px · z∈[+290,+444]mm · 16 of 36 slices shown, 20 images]
[im 2/36  brain]
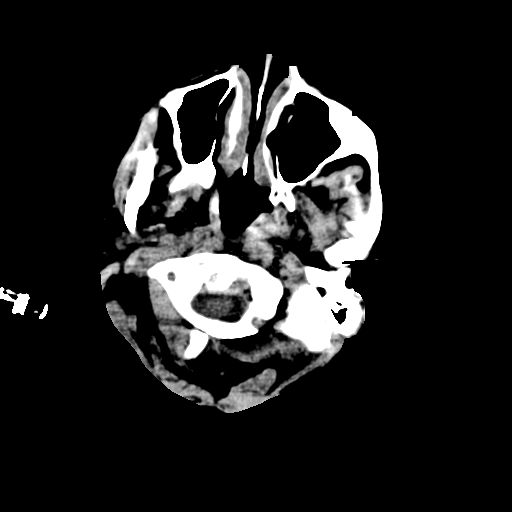
[im 2/36  bone]
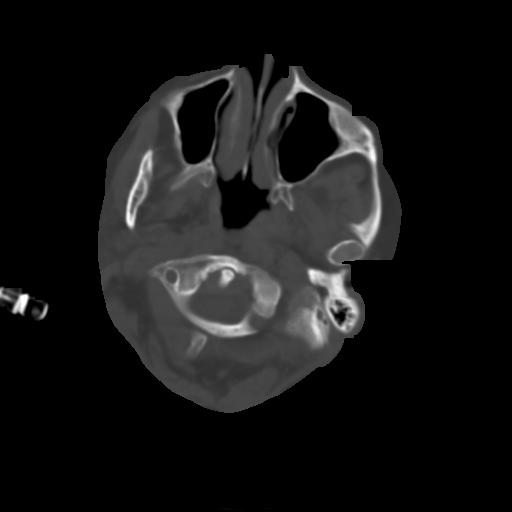
[im 4/36  brain]
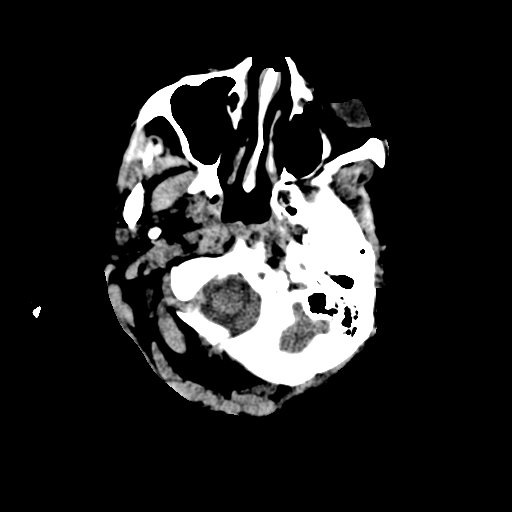
[im 7/36  brain]
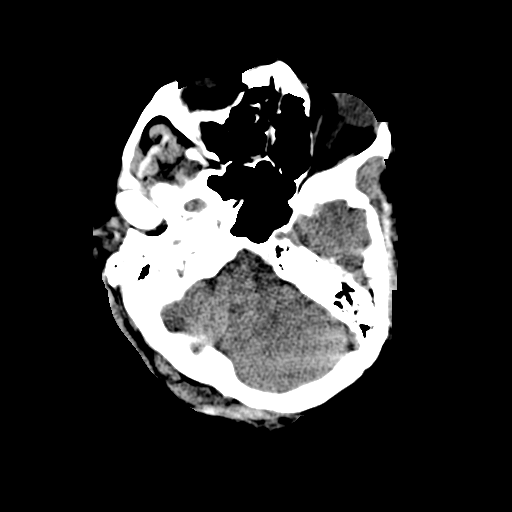
[im 9/36  brain]
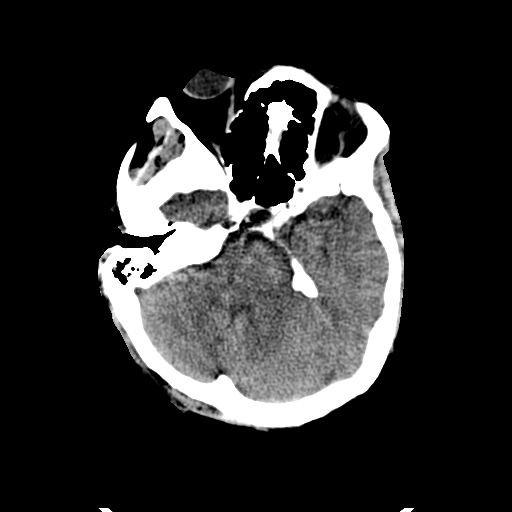
[im 10/36  brain]
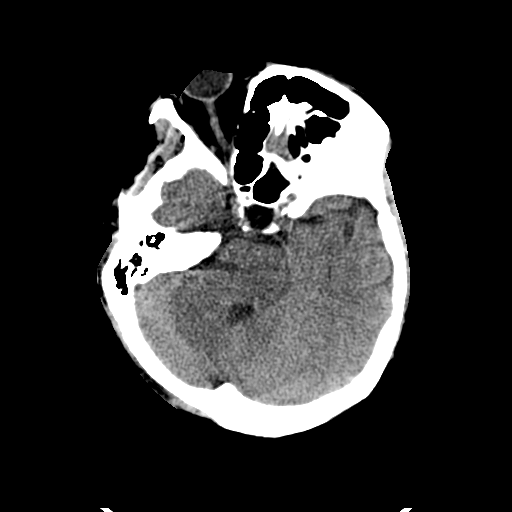
[im 10/36  bone]
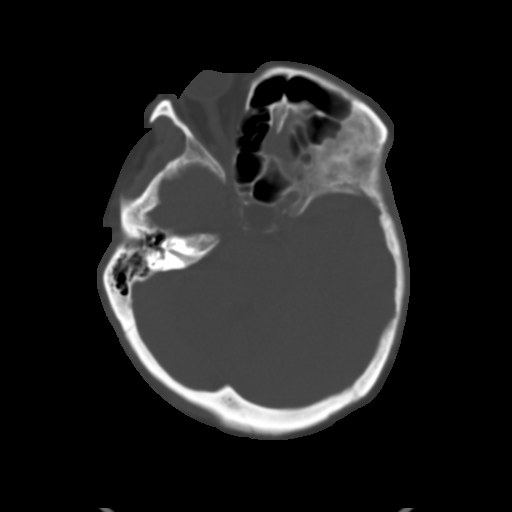
[im 13/36  brain]
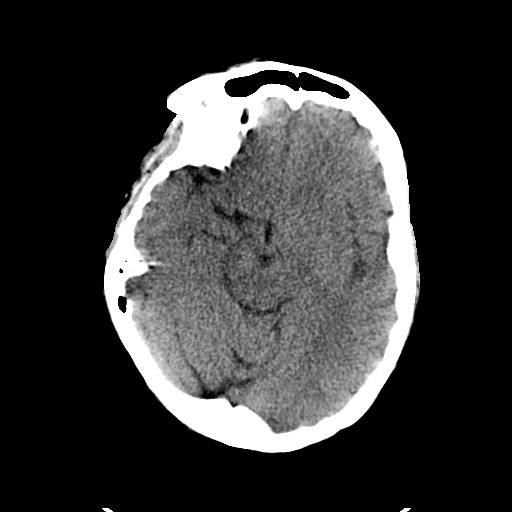
[im 15/36  brain]
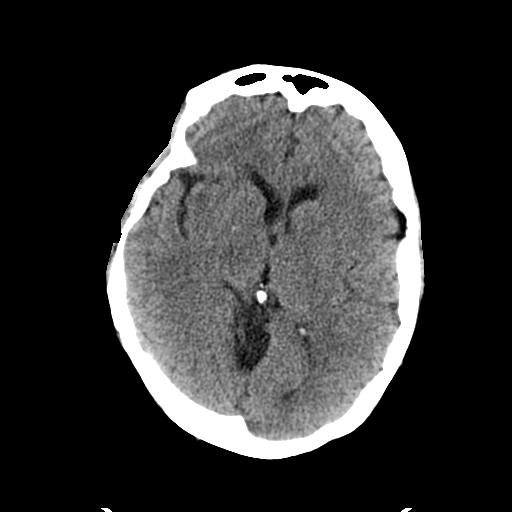
[im 17/36  brain]
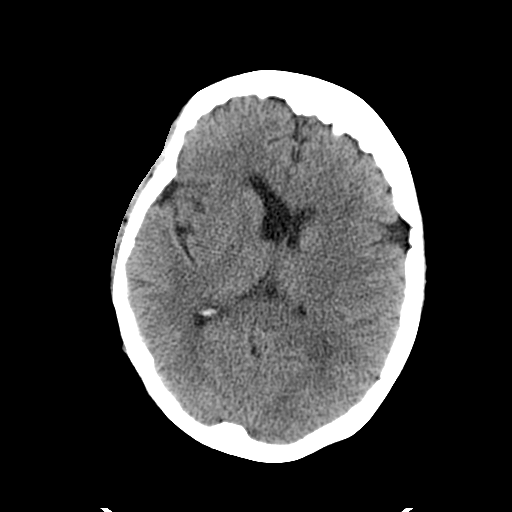
[im 19/36  brain]
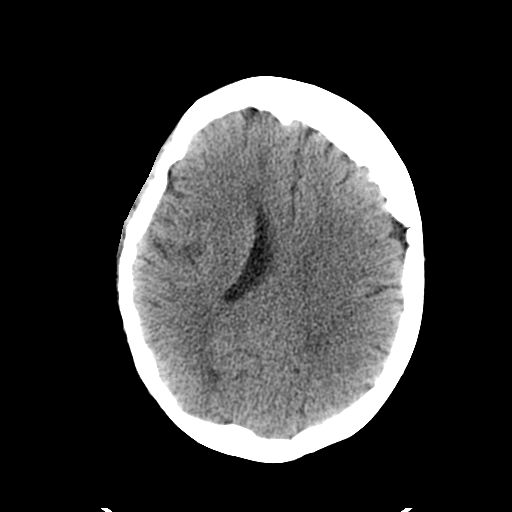
[im 19/36  bone]
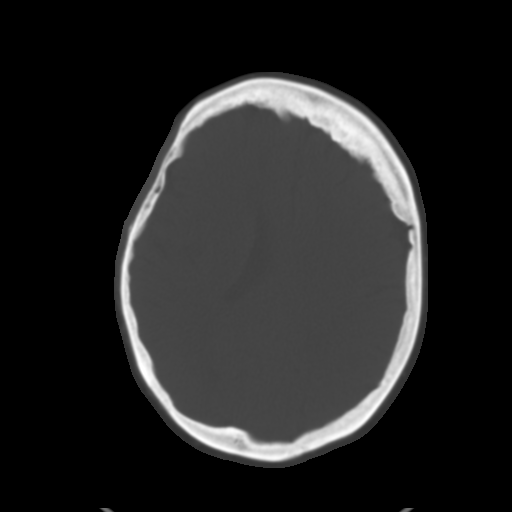
[im 21/36  brain]
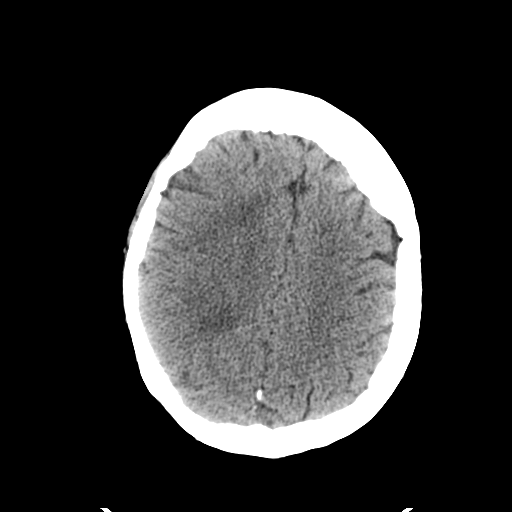
[im 23/36  brain]
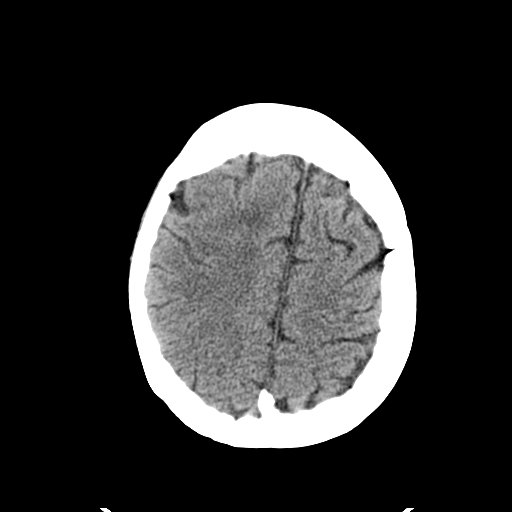
[im 26/36  brain]
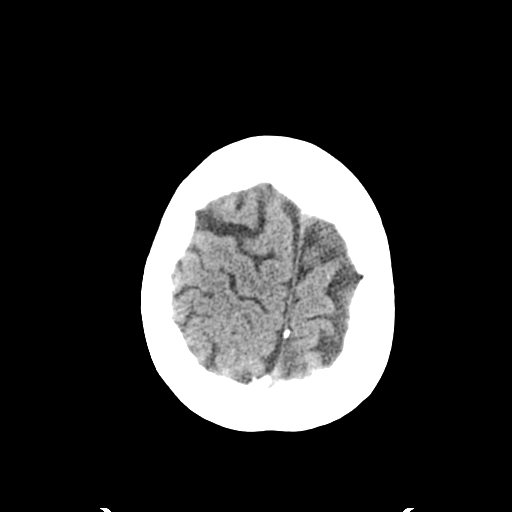
[im 27/36  brain]
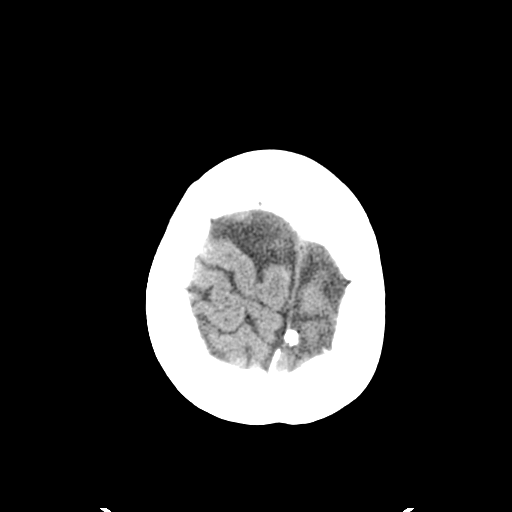
[im 27/36  bone]
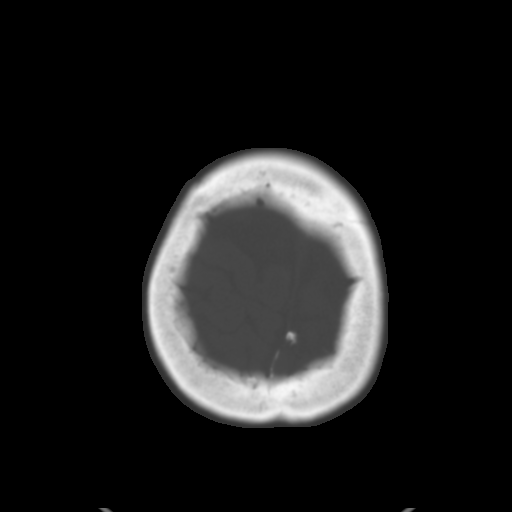
[im 29/36  brain]
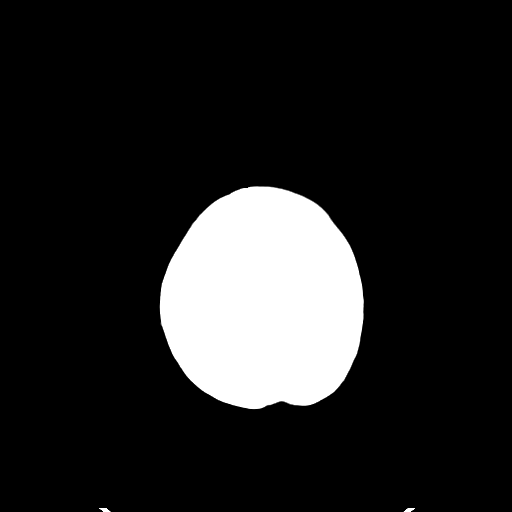
[im 32/36  brain]
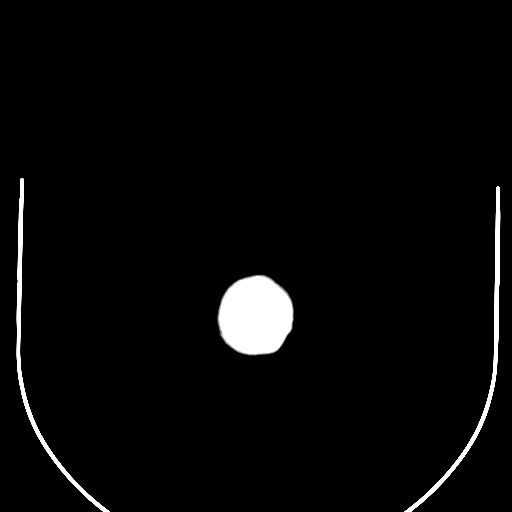
[im 34/36  brain]
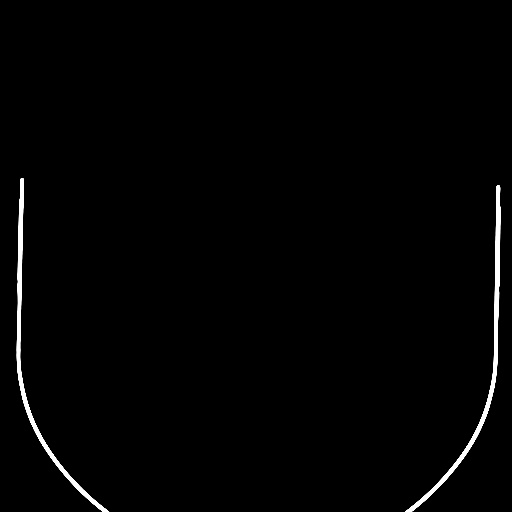

[16 of 30 positions shown; findings below may reference images not displayed]

FINDINGS: Mild bubbly opacity in the right sphenoid.  Other
Visualized paranasal sinuses and mastoids are clear.  The patient
is intubated.  No acute scalp or orbit soft tissue findings. No
acute osseous abnormality identified.

Stable cerebral volume.  No ventriculomegaly. Stable gray-white
matter differentiation throughout the brain.  No midline shift,
mass effect, or evidence of mass lesion.  No acute intracranial
hemorrhage identified.  No evidence of cortically based acute
infarction identified.  No suspicious intracranial vascular
hyperdensity.
IMPRESSION: 1.  Stable noncontrast CT appearance the brain without acute
intracranial abnormality.
2.  Mild paranasal sinus inflammatory changes in the setting of
intubation.

## 2011-09-12 IMAGING — CR DG CHEST 1V PORT
1 series · 1 of 1 positions shown · non-contrast
Comparison: Film at [0Z] hours.

CLINICAL DATA: Respiratory failure and status post intubation.

PORTABLE CHEST - 1 VIEW

[view not recorded]
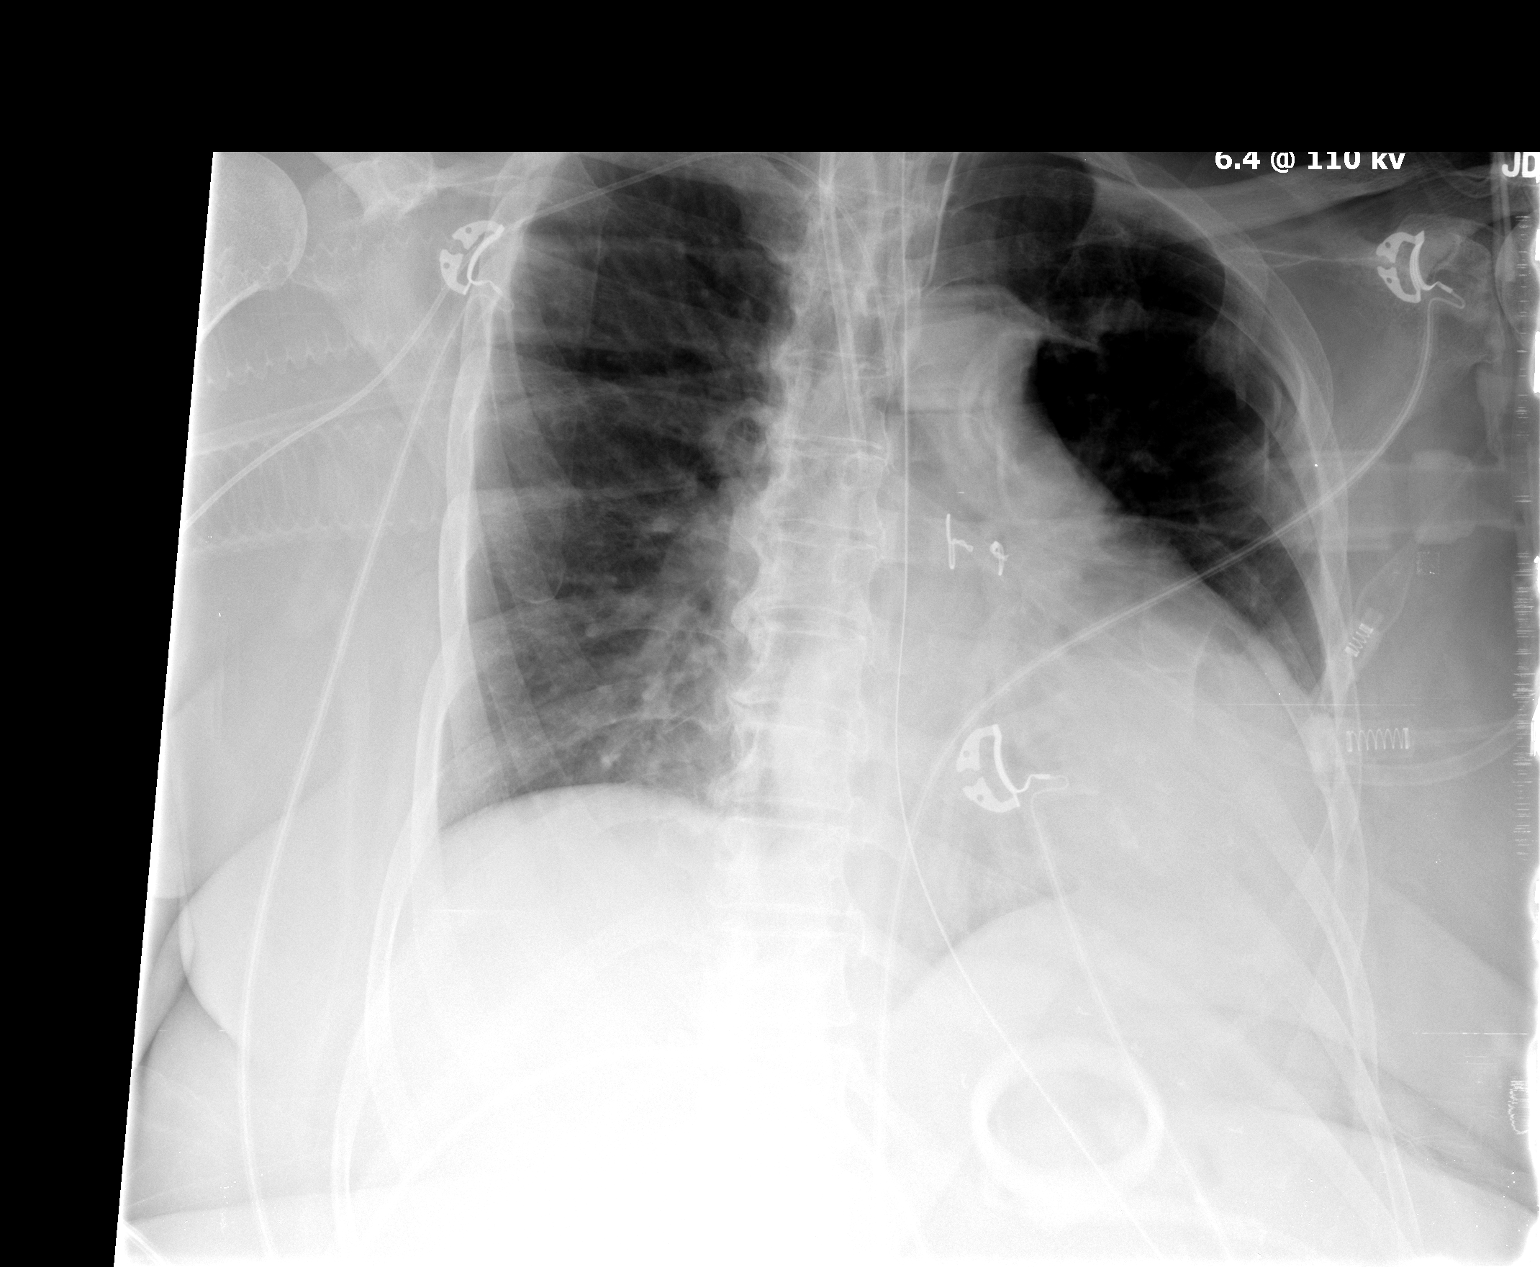

[1 of 1 positions shown; findings below may reference images not displayed]

FINDINGS: Endotracheal tube present with the tip approximately
cm above the carina.  Stable positioning of temporary dialysis
catheter with the tip in the lower SVC.  Additional right-sided
PICC line tip continues to lie at the level of the proximal SVC.
Nasogastric tube continues to extend into the stomach.

Lungs show stable cardiomegaly and left lower lobe atelectasis.  No
overt edema present.  No evidence of pneumothorax.  No significant
pleural fluid.
IMPRESSION: Endotracheal tube appropriately positioned with tip 2.5 cm above
the carina.  Otherwise no significant change in appearance of the
chest.

## 2011-09-12 IMAGING — CR DG CHEST 1V PORT
1 series · 1 of 1 positions shown · non-contrast
Comparison: [DATE]

CLINICAL DATA: Line placement.

PORTABLE CHEST - 1 VIEW

[view not recorded]
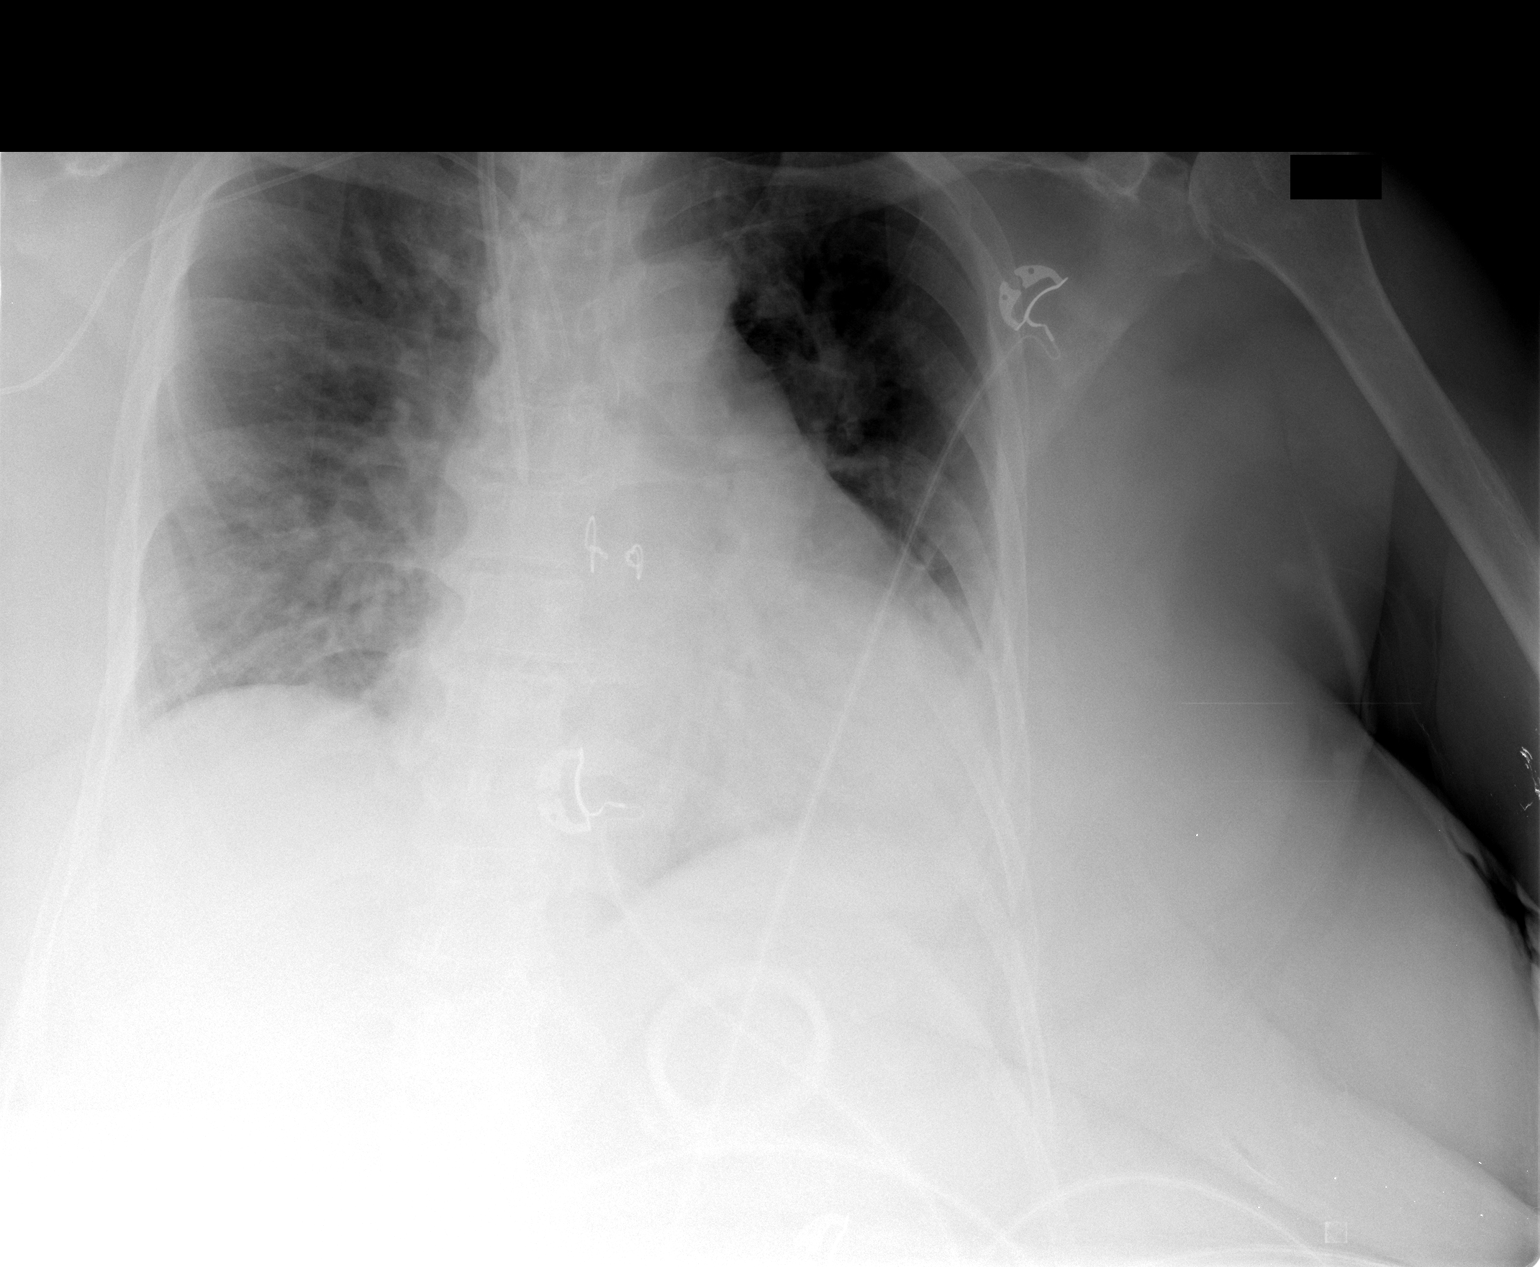

[1 of 1 positions shown; findings below may reference images not displayed]

FINDINGS: Right central line placement.  The tip is in the SVC.  No
pneumothorax.  Right PICC line is unchanged.  Cardiomegaly.  Mild
vascular congestion. No effusions.  Probable left basilar opacity,
unchanged.  No overt edema.
IMPRESSION: Right central line tip in the SVC.  No pneumothorax.  Otherwise no
change.

## 2011-09-12 IMAGING — CR DG CHEST 1V PORT
1 series · 1 of 1 positions shown · non-contrast
Comparison: [DATE]

CLINICAL DATA: Edema, renal failure

PORTABLE CHEST - 1 VIEW

[view not recorded]
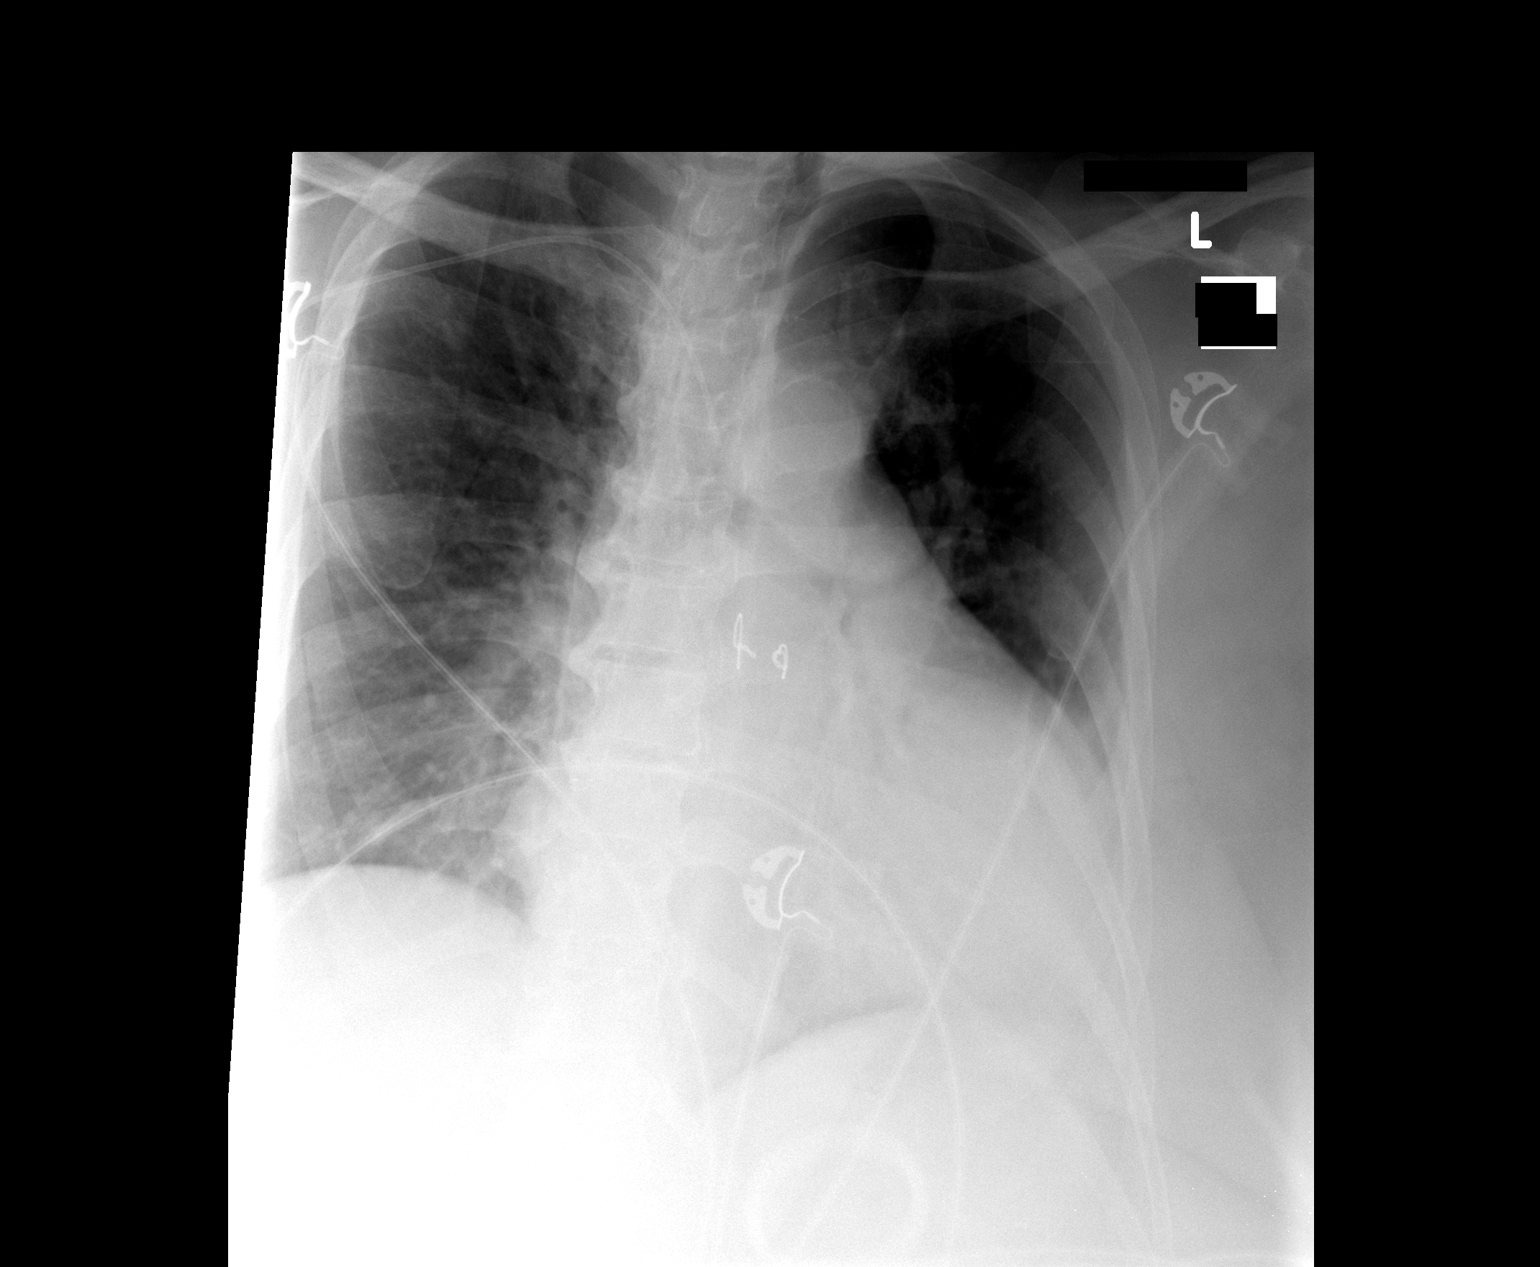

[1 of 1 positions shown; findings below may reference images not displayed]

FINDINGS: Cardiomegaly again noted.  No pulmonary edema.  Stable
right arm PICC line position with tip in upper SVC.  Stable left
basilar atelectasis or infiltrate.
IMPRESSION: No pulmonary edema.  Again noted left basilar atelectasis or
infiltrate.

## 2011-09-12 MED ORDER — NALOXONE HCL 0.4 MG/ML IJ SOLN
0.4000 mg | Freq: Once | INTRAMUSCULAR | Status: AC
  Administered 2011-09-12: 0.4 mg via INTRAVENOUS

## 2011-09-12 MED ORDER — CHLORHEXIDINE GLUCONATE 0.12 % MT SOLN
15.0000 mL | Freq: Two times a day (BID) | OROMUCOSAL | Status: DC
  Administered 2011-09-12 – 2011-09-13 (×4): 15 mL via OROMUCOSAL
  Filled 2011-09-12 (×4): qty 15

## 2011-09-12 MED ORDER — BIOTENE DRY MOUTH MT LIQD
1.0000 "application " | Freq: Four times a day (QID) | OROMUCOSAL | Status: DC
  Administered 2011-09-13 – 2011-09-14 (×6): 15 mL via OROMUCOSAL

## 2011-09-12 MED ORDER — PROPOFOL 10 MG/ML IV EMUL
5.0000 ug/kg/min | INTRAVENOUS | Status: DC
  Administered 2011-09-12 – 2011-09-13 (×2): 9.623 ug/kg/min via INTRAVENOUS
  Filled 2011-09-12 (×3): qty 100

## 2011-09-12 MED ORDER — HEPARIN SODIUM (PORCINE) 1000 UNIT/ML DIALYSIS
1000.0000 [IU] | INTRAMUSCULAR | Status: DC | PRN
  Administered 2011-09-12: 2400 [IU] via INTRAVENOUS_CENTRAL
  Administered 2011-09-18: 1000 [IU] via INTRAVENOUS_CENTRAL
  Administered 2011-09-21: 2400 [IU] via INTRAVENOUS_CENTRAL
  Filled 2011-09-12: qty 1

## 2011-09-12 MED ORDER — SOTALOL HCL 80 MG PO TABS
80.0000 mg | ORAL_TABLET | ORAL | Status: DC
  Administered 2011-09-12 – 2011-09-30 (×10): 80 mg via ORAL
  Filled 2011-09-12 (×12): qty 1

## 2011-09-12 MED ORDER — PENTAFLUOROPROP-TETRAFLUOROETH EX AERO: 1.0000 "application " | INHALATION_SPRAY | CUTANEOUS | Status: DC | PRN

## 2011-09-12 MED ORDER — DILTIAZEM HCL 60 MG PO TABS
60.0000 mg | ORAL_TABLET | Freq: Four times a day (QID) | ORAL | Status: DC
  Administered 2011-09-13 – 2011-09-20 (×23): 60 mg via ORAL
  Filled 2011-09-12 (×35): qty 1

## 2011-09-12 MED ORDER — VECURONIUM BROMIDE 10 MG IV SOLR
INTRAVENOUS | Status: AC
  Filled 2011-09-12: qty 10

## 2011-09-12 MED ORDER — METOPROLOL SUCCINATE ER 50 MG PO TB24: 50.0000 mg | ORAL_TABLET | Freq: Two times a day (BID) | ORAL | Status: DC

## 2011-09-12 MED ORDER — FENTANYL CITRATE 0.05 MG/ML IJ SOLN
INTRAMUSCULAR | Status: AC
  Administered 2011-09-12: 200 ug
  Filled 2011-09-12: qty 4

## 2011-09-12 MED ORDER — CALCITONIN (SALMON) 200 UNIT/ML IJ SOLN
480.0000 [IU] | Freq: Two times a day (BID) | INTRAMUSCULAR | Status: AC
  Administered 2011-09-12 – 2011-09-13 (×3): 480 [IU] via SUBCUTANEOUS
  Filled 2011-09-12 (×5): qty 2.4

## 2011-09-12 MED ORDER — SODIUM CHLORIDE 0.9 % IV SOLN
25.0000 ug/h | INTRAVENOUS | Status: DC
  Administered 2011-09-12: 25 ug/h via INTRAVENOUS
  Administered 2011-09-14: 50 ug/h via INTRAVENOUS
  Filled 2011-09-12 (×2): qty 50

## 2011-09-12 MED ORDER — SODIUM CHLORIDE 0.9 % IV SOLN: 100.0000 mL | INTRAVENOUS | Status: DC | PRN

## 2011-09-12 MED ORDER — LIDOCAINE HCL (PF) 1 % IJ SOLN: 5.0000 mL | INTRAMUSCULAR | Status: DC | PRN

## 2011-09-12 MED ORDER — HEPARIN SODIUM (PORCINE) 1000 UNIT/ML DIALYSIS
20.0000 [IU]/kg | INTRAMUSCULAR | Status: DC | PRN
  Filled 2011-09-12: qty 3

## 2011-09-12 MED ORDER — NEPRO/CARBSTEADY PO LIQD
237.0000 mL | ORAL | Status: DC | PRN
  Filled 2011-09-12: qty 237

## 2011-09-12 MED ORDER — INSULIN ASPART 100 UNIT/ML ~~LOC~~ SOLN
0.0000 [IU] | SUBCUTANEOUS | Status: DC
  Filled 2011-09-12: qty 3

## 2011-09-12 MED ORDER — SODIUM CHLORIDE 0.9 % IJ SOLN
INTRAMUSCULAR | Status: AC
  Filled 2011-09-12: qty 30

## 2011-09-12 MED ORDER — METOPROLOL TARTRATE 50 MG PO TABS
50.0000 mg | ORAL_TABLET | Freq: Two times a day (BID) | ORAL | Status: DC
  Administered 2011-09-12 – 2011-10-02 (×38): 50 mg via ORAL
  Filled 2011-09-12 (×43): qty 1

## 2011-09-12 MED ORDER — PREDNISONE 5 MG/5ML PO SOLN
30.0000 mg | Freq: Every day | ORAL | Status: DC
  Administered 2011-09-13 – 2011-09-15 (×3): 30 mg
  Filled 2011-09-12 (×5): qty 30

## 2011-09-12 MED ORDER — FENTANYL CITRATE 0.05 MG/ML IJ SOLN
25.0000 ug | INTRAMUSCULAR | Status: DC | PRN
  Administered 2011-09-17 – 2011-09-26 (×3): 50 ug via INTRAVENOUS
  Filled 2011-09-12 (×3): qty 2

## 2011-09-12 MED ORDER — PANTOPRAZOLE SODIUM 40 MG IV SOLR
40.0000 mg | INTRAVENOUS | Status: DC
  Administered 2011-09-12 – 2011-09-14 (×3): 40 mg via INTRAVENOUS
  Filled 2011-09-12 (×6): qty 40

## 2011-09-12 MED ORDER — IPRATROPIUM-ALBUTEROL 18-103 MCG/ACT IN AERO
6.0000 | INHALATION_SPRAY | RESPIRATORY_TRACT | Status: DC
  Administered 2011-09-12 – 2011-09-15 (×12): 6 via RESPIRATORY_TRACT
  Filled 2011-09-12: qty 14.7

## 2011-09-12 MED ORDER — FUROSEMIDE 10 MG/ML IJ SOLN
80.0000 mg | Freq: Four times a day (QID) | INTRAMUSCULAR | Status: DC
  Administered 2011-09-12 – 2011-09-17 (×19): 80 mg via INTRAVENOUS
  Filled 2011-09-12 (×23): qty 8

## 2011-09-12 MED ORDER — ALTEPLASE 2 MG IJ SOLR
2.0000 mg | Freq: Once | INTRAMUSCULAR | Status: AC | PRN
  Filled 2011-09-12: qty 2

## 2011-09-12 MED ORDER — PROPOFOL 10 MG/ML IV EMUL
INTRAVENOUS | Status: AC
  Filled 2011-09-12: qty 100

## 2011-09-12 MED ORDER — HEPARIN SODIUM (PORCINE) 1000 UNIT/ML DIALYSIS
20.0000 [IU]/kg | INTRAMUSCULAR | Status: DC | PRN
  Administered 2011-09-12: 2500 [IU] via INTRAVENOUS_CENTRAL
  Filled 2011-09-12: qty 3

## 2011-09-12 MED ORDER — IPRATROPIUM-ALBUTEROL 18-103 MCG/ACT IN AERO: 6.0000 | INHALATION_SPRAY | RESPIRATORY_TRACT | Status: DC | PRN

## 2011-09-12 MED ORDER — LIDOCAINE-PRILOCAINE 2.5-2.5 % EX CREA
1.0000 "application " | TOPICAL_CREAM | CUTANEOUS | Status: DC | PRN
  Filled 2011-09-12: qty 5

## 2011-09-12 MED ORDER — NALOXONE HCL 0.4 MG/ML IJ SOLN
INTRAMUSCULAR | Status: AC
  Administered 2011-09-12: 0.4 mg via INTRAVENOUS
  Filled 2011-09-12: qty 1

## 2011-09-12 MED ORDER — DEXTROSE 10 % IV SOLN
INTRAVENOUS | Status: DC
  Administered 2011-09-12 – 2011-09-14 (×2): via INTRAVENOUS

## 2011-09-12 MED ORDER — ETOMIDATE 2 MG/ML IV SOLN
INTRAVENOUS | Status: AC
  Administered 2011-09-12: 20 mg
  Filled 2011-09-12: qty 10

## 2011-09-12 MED ORDER — MIDAZOLAM HCL 2 MG/2ML IJ SOLN
INTRAMUSCULAR | Status: AC
  Administered 2011-09-12: 4 mg
  Filled 2011-09-12: qty 4

## 2011-09-12 MED ORDER — LORAZEPAM 2 MG/ML IJ SOLN: 2.0000 mg | INTRAMUSCULAR | Status: DC | PRN

## 2011-09-12 MED FILL — Morphine Sulfate Inj 10 MG/ML: INTRAMUSCULAR | Qty: 1 | Status: AC

## 2011-09-12 NOTE — Progress Notes (Signed)
ANTIBIOTIC CONSULT NOTE - FOLLOW UP  Pharmacy Consult for Vancomycin Indication: Suspected PNA  Allergies  Allergen Reactions  . Coumadin Other (See Comments)    Caused her to have a stroke    Patient Measurements: Height: 5\' 7"  (170.2 cm) Weight: 274 lb 14.6 oz (124.7 kg) IBW/kg (Calculated) : 61.6   Vital Signs: Temp: 97.8 F (36.6 C) (02/27 1449) Temp src: Oral (02/27 1449) BP: 117/62 mmHg (02/27 1812) Pulse Rate: 82  (02/27 1812) Intake/Output from previous day: 02/26 0701 - 02/27 0700 In: 2190 [P.O.:240; I.V.:1950] Out: 300 [Urine:300] Intake/Output from this shift: Total I/O In: -  Out: 1740 [Other:1740]  Labs:  [facility] 09/12/11 0915 09/11/11 0825 09/10/11 2000  WBC -- -- 13.4*  HGB -- -- 8.3*  PLT -- -- 423*  LABCREA -- -- --  CREATININE 5.85* 5.32* 5.16*   Estimated Creatinine Clearance: 13.5 ml/min (by C-G formula based on Cr of 5.85).  Basename 09/12/11 1509 09/12/11 1235  VANCOTROUGH -- --  VANCOPEAK -- --  VANCORANDOM 31.8 37.6  GENTTROUGH -- --  GENTPEAK -- --  GENTRANDOM -- --  TOBRATROUGH -- --  TOBRAPEAK -- --  TOBRARND -- --  AMIKACINPEAK -- --  AMIKACINTROU -- --  AMIKACIN -- --     Microbiology: Recent Results (from the past 720 hour(s))  SURGICAL PCR SCREEN     Status: Abnormal   Collection Time   08/28/11 10:33 AM      Component Value Range Status Comment   MRSA, PCR NEGATIVE  NEGATIVE  Final    Staphylococcus aureus POSITIVE (*) NEGATIVE  Final   GRAM STAIN     Status: Normal   Collection Time   09/03/11  8:08 AM      Component Value Range Status Comment   Specimen Description FLUID SYNOVIAL RIGHT HIP ON SWAB   Final    Special Requests NONE   Final    Gram Stain     Final    Value: ABUNDANT WBC PRESENT, PREDOMINANTLY PMN     NO ORGANISMS SEEN     Gram Stain Report Called to,Read Back By and Verified With:     DAVENPORT,S. RN AT [id number] ON 09/03/11 BY GILLESPIE,B.   Report Status 09/03/2011 FINAL   Final   BODY FLUID  CULTURE     Status: Normal   Collection Time   09/03/11  8:09 AM      Component Value Range Status Comment   Specimen Description FLUID SYNOVIAL RIGHT HIP ON SWAB   Final    Special Requests NONE   Final    Gram Stain     Final    Value: ABUNDANT WBC PRESENT, PREDOMINANTLY PMN     NO ORGANISMS SEEN     Gram Stain Report Called to,Read Back By and Verified With: Gram Stain Report Called to,Read Back By and Verified With: DAVENPORT S RN @ [phone] ON 09/03/11 BY Ethel Rana B Performed by [facility]   Culture     Final    Value: RARE PSEUDOMONAS AERUGINOSA     Note: CRITICAL RESULT CALLED TO, READ BACK BY AND VERIFIED WITH: KIMBERLY OWENS   Report Status 09/06/2011 FINAL   Final    Organism ID, Bacteria PSEUDOMONAS AERUGINOSA   Final   ANAEROBIC CULTURE     Status: Normal   Collection Time   09/03/11  8:10 AM      Component Value Range Status Comment   Specimen Description FLUID SYNOVIAL RIGHT HIP   Final  Special Requests NONE   Final    Gram Stain     Final    Value: NO WBC SEEN     NO SQUAMOUS EPITHELIAL CELLS SEEN     NO ORGANISMS SEEN   Culture NO ANAEROBES ISOLATED   Final    Report Status 09/07/2011 FINAL   Final   CULTURE, BLOOD (ROUTINE X 2)     Status: Normal (Preliminary result)   Collection Time   09/10/11  7:55 PM      Component Value Range Status Comment   Specimen Description BLOOD   Final    Special Requests BOTTLES DRAWN AEROBIC AND ANAEROBIC 3CC   Final    Culture  Setup Time [id number]   Final    Culture     Final    Value:        BLOOD CULTURE RECEIVED NO GROWTH TO DATE CULTURE WILL BE HELD FOR 5 DAYS BEFORE ISSUING A FINAL NEGATIVE REPORT   Report Status PENDING   Incomplete   CULTURE, BLOOD (ROUTINE X 2)     Status: Normal (Preliminary result)   Collection Time   09/10/11  8:00 PM      Component Value Range Status Comment   Specimen Description BLOOD LEFT ARM   Final    Special Requests BOTTLES DRAWN AEROBIC AND ANAEROBIC 4CC   Final    Culture  Setup  Time [id number]   Final    Culture     Final    Value:        BLOOD CULTURE RECEIVED NO GROWTH TO DATE CULTURE WILL BE HELD FOR 5 DAYS BEFORE ISSUING A FINAL NEGATIVE REPORT   Report Status PENDING   Incomplete   URINE CULTURE     Status: Normal   Collection Time   09/10/11 10:53 PM      Component Value Range Status Comment   Specimen Description URINE, CATHETERIZED   Final    Special Requests NONE   Final    Culture  Setup Time [id number]   Final    Colony Count NO GROWTH   Final    Culture NO GROWTH   Final    Report Status 09/12/2011 FINAL   Final   MRSA PCR SCREENING     Status: Normal   Collection Time   09/11/11 12:48 AM      Component Value Range Status Comment   MRSA by PCR NEGATIVE  NEGATIVE  Final     Anti-infectives     Start     Dose/Rate Route Frequency Ordered Stop   09/12/11 2200   levofloxacin (LEVAQUIN) IVPB 500 mg        500 mg 100 mL/hr over 60 Minutes Intravenous Every 48 hours 09/11/11 0042     09/12/11 0200   ceFEPIme (MAXIPIME) 1 g in dextrose 5 % 50 mL IVPB        1 g 100 mL/hr over 30 Minutes Intravenous Every 24 hours 09/11/11 1003     09/11/11 0045   cefTRIAXone (ROCEPHIN) 1 g in dextrose 5 % 50 mL IVPB  Status:  Discontinued        1 g 100 mL/hr over 30 Minutes Intravenous Daily at bedtime 09/11/11 0017 09/11/11 1003   09/10/11 2300   vancomycin (VANCOCIN) 2,000 mg in sodium chloride 0.9 % 500 mL IVPB        2,000 mg 250 mL/hr over 120 Minutes Intravenous  Once 09/10/11 2200 09/11/11 0513   09/10/11 2145   ceFEPIme (  MAXIPIME) 1 g in dextrose 5 % 50 mL IVPB        1 g 100 mL/hr over 30 Minutes Intravenous  Once 09/10/11 2138 09/11/11 0241   09/10/11 2145   Levofloxacin (LEVAQUIN) IVPB 750 mg        750 mg 100 mL/hr over 90 Minutes Intravenous  Once 09/10/11 2138 09/10/11 2323          Assessment: 65 YOF on Day #2 Vancomycin/Levaquin/Cefepime for HCAP. Patient with acute on chronic renal failure. Scr 5.85 from baseline ~ 2.3, one dose  of 2g was given on 2/26 at 0313 per chart. Pt had acute HD today (2.5hrs), post HD vanc level supratherapeutic (31.8).   Goal of Therapy:  Vancomycin trough level 15-20 mcg/ml  Plan:  - No need to give additional vancomycin for now - f/u plan for repeat HD - consider recheck vancomycin level after 1-2 days.  Manley Mason 09/12/2011,6:49 PM

## 2011-09-12 NOTE — Consult Note (Signed)
Name: [name] MRN: [id number] DOB: 09-04-1945    LOS: 2  PCCM CONSULTATION NOTE  History of Present Illness: 66 y/o AAF with PMH of HTN, CVA 2/2 ICH, CKD, Gout, Afib (not on coumadin) admitted to [facility] on 2/13 for right hip infection / wound breakdown after right hip arthroplasty.  She underwent I & D on 2/18 and discharge on 2/21 on Xarelto.  Presented to ED on 2/25 with confusion, slurred speech from nursing facility.  Known to have CKD with baseline creatinine of 2.3 (as of 2/210.  Initial cr of 5.1 on admit with AMS, hypercalcemia.  CT head was negative on 2/25.  2/27 afternoon was noted to have significantly altered mental status by [facility] after HD (1.2 L removed) and PCCM consulted.    Lines / Drains: 2/27 OETT>>> 2/27 OGT>>> ? PICC line>>>  Cultures: 2/25 Urine>>>NTD 2/25 Blood>>>NTD  Antibiotics: 2/25 Maxipime>>> 2/26 Rocephin>>> 2/25 Levaquin>>>  Tests / Events: 2/25 CT HEAD>>>Negative for bleed or other acute intracranial process  The patient is sedated, intubated and unable to provide history, which was obtained for available medical records.    Past Medical History  Diagnosis Date  . Hypertension   . Depression   . Fractures, stress     in both feet  . Kidney disease     pt states creatinine is high  . 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 Stroke   . Gout   . Asthma     MILD NOW-NO INHALERS  . Sleep apnea     USES CPAP  . Anemia   . Blood transfusion   . Arthritis     HIP  . Atrial fibrillation     PT TAKES ASPIRIN AS BLOOD THINNER-HX OF CEREBRAL BLEED WHILE ON COUMADIN  . Arrhythmia   . Cardiomyopathy   . Hypercalcemia     09/10/11  . HCAP (healthcare-associated pneumonia)     09/10/11   Past Surgical History  Procedure Date  . Cholecystectomy   . 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 Joint replacement     right hip and right femur x 2  . Right femur fracture x2     FRACTURES WERE AFTER AFTER RIGHT HIP  REPLACEMENT   Prior to Admission medications   Medication Sig Start Date End Date Taking? Authorizing Provider  colchicine 0.6 MG tablet Take 1 tablet (0.6 mg total) by mouth daily. 06/11/11 06/10/12 Yes Irine Seal, MD  diltiazem (CARDIZEM CD) 300 MG 24 hr capsule Take 300 mg by mouth daily before breakfast.    Yes Historical Provider, MD  diphenhydrAMINE (BENADRYL) 25 MG tablet Take 25 mg by mouth every 6 (six) hours as needed. Itching/rash   Yes Historical Provider, MD  docusate sodium 100 MG CAPS Take 100 mg by mouth 2 (two) times daily. 09/06/11 09/16/11 Yes Lucille Passy Babish, PA  doxercalciferol (HECTOROL) 0.5 MCG capsule Take 0.5 mcg by mouth daily before breakfast.    Yes Historical Provider, MD  ferrous sulfate 325 (65 FE) MG tablet Take 325 mg by mouth 3 (three) times daily with meals.    Yes Historical Provider, MD  furosemide (LASIX) 80 MG tablet Take 80-160 mg by mouth 2 (two) times daily. 2 tab in am and 1 tab   Yes Historical Provider, MD  gabapentin (NEURONTIN) 300 MG capsule Take 300 mg by mouth 3 (three) times daily.    Yes Historical Provider, MD  HYDROcodone-acetaminophen [facility]) 7.5-325  MG per tablet Take 1-2 tablets by mouth every 4 (four) hours. 09/06/11 09/16/11 Yes Lucille Passy Babish, PA  methocarbamol (ROBAXIN) 500 MG tablet Take 1 tablet (500 mg total) by mouth every 6 (six) hours as needed (muscle spasms). 09/06/11 09/16/11 Yes Lucille Passy Babish, PA  metoprolol (TOPROL XL) 100 MG 24 hr tablet Take 100 mg by mouth daily before breakfast.    Yes Historical Provider, MD  Multiple Vitamins-Minerals (MULTIVITAMINS THER. W/MINERALS) TABS Take 1 tablet by mouth daily.    Yes Historical Provider, MD  nystatin (NYSTOP) 100000 UNIT/GM POWD Apply 1 g topically daily as needed. Rash    06/11/11  Yes Irine Seal, MD  polyethylene glycol [facility] / GLYCOLAX) packet Take 17 g by mouth 2 (two) times daily. 09/06/11 10/11/11 Yes Lucille Passy Babish, PA  potassium chloride SA  (KLOR-CON M20) 20 MEQ tablet Take 20 mEq by mouth daily.    Yes Historical Provider, MD  rivaroxaban (XARELTO) 10 MG TABS tablet Take 1 tablet (10 mg total) by mouth daily. 09/06/11  Yes Lucille Passy Babish, PA  saccharomyces boulardii (FLORASTOR) 250 MG capsule Take 1 capsule (250 mg total) by mouth 2 (two) times daily. 09/06/11 09/13/11 Yes Lucille Passy Babish, PA  sertraline (ZOLOFT) 50 MG tablet Take 1 tablet (50 mg total) by mouth daily before breakfast. 08/28/11  Yes Rosalita Chessman, DO  sotalol (BETAPACE) 80 MG tablet Take 80 mg by mouth 2 (two) times daily.    Yes Historical Provider, MD   Allergies Allergies  Allergen Reactions  . Coumadin Other (See Comments)    Caused her to have a stroke   Family History Family History  Problem Relation Age of Onset  . Diabetes Mother   . Hypertension Mother   . Hodgkin's lymphoma  32    dscd---HODGKINS DISEASE  . Cancer Father    Social History  reports that she has never smoked. She has never used smokeless tobacco. She reports that she does not drink alcohol or use illicit drugs.  Review Of Systems  Patient unable to provide  Vital Signs: Filed Vitals:   09/12/11 1425 09/12/11 1430 09/12/11 1437 09/12/11 1449  BP: 81/24 98/67 116/80 113/63  Pulse:  78 54 63  Temp:    97.8 F (36.6 C)  TempSrc:    Oral  Resp:  18 18 15   Height:      Weight:      SpO2: 99% 100%  100%    I/O last 3 completed shifts: In: 2190 [P.O.:240; I.V.:1950] Out: 475 [Urine:475]  Physical Examination: General:  Chronically ill in no acute distress Neuro:  Somnlent, minimal response to verbal / physical stimuli, weak cough and gag HEENT:  PERRL, pink conjunctivae, moist membranes, face asymmetry (new), exophthalmus  Neck:  Supple, no JVD   Cardiovascular:  RRR, no M/R/G Lungs:  Bilateral diminished air entry, no W/R/R Abdomen:  Soft, nontender, nondistended, bowel sounds present Musculoskeletal:  Moves all extremities, no pedal edema Skin:  No  rash  Ventilator settings:   Labs and Imaging:  Reviewed.  Please refer to the Assessment and Plan section for relevant results.  ASSESSMENT AND PLAN  NEUROLOGIC A:  Encephalopathy, multifactorial (uremia, hypercalcemia, Gabapentin / Hydrocodone).  Doubt infectious etiology.  New face asymmetry.  History of CVA (hemorrhagic).  Negative head CT on 2/25. P: -->  Repeat head CT tonight (evolving CVA, may not have been apparent on 2/25) -->  Versed / Fentanyl boluses to goal RASS 0 to -1 -->  Daily  WUA  PULMONARY  Lab 09/12/11 0922  PHART 7.258*  PCO2ART 48.1*  PO2ART 68.1*  HCO3 20.8  O2SAT 93.6   A:  Intubated for airway protection.  Combined metabolic and respiratory acidosis.  History of Asthma and OSA on CPAP.  Possible aspiration pneumonia / pneumonitis as food particles aspirated by ETT Sx. P: -->  Full mechanical support -->  Goal pH>7.30, goal SpO2>92 -->  Follow up ABG -->  Follow up CXR -->  Daily SBT  CARDIOVASCULAR No results found for this basename: TROPONINI:5,LATICACIDVEN:5, O2SATVEN:5,PROBNP:5 in the last 168 hours A:  Hemodynamically stable.  No evidence of ischemia.  History of HTN and cardiomyopathy (last known EF 2010 55%).  History of atrial fibrillation, now in sinus rhythm. P: -->  Sotalol, Metoprolol and Cardizem (per preadmission)   RENAL  Lab 09/12/11 0915 09/11/11 0825 09/10/11 2000 09/06/11 0810  NA 142 137 137 130*  K 5.3* 4.0 -- --  CL 109 105 102 99  CO2 21 24 26 25   BUN 62* 56* 56* 44*  CREATININE 5.85* 5.32* 5.16* 2.34*  CALCIUM 11.8* 11.1*11.9* 12.7* 10.7*  MG -- -- -- --  PHOS 4.8* -- -- --   A:  Acute on chronic renal failure.  Hypercalcemia (corrected Ca 12.9).  Hyperphosphatemia. P: -->  HD per Renal -->  Resend BMP and Ca now and in AM -->  Calcitonin and Prednisone -->  Cannot use bisphosphonates due to renal failure   GASTROINTESTINAL  Lab 09/12/11 0915 09/10/11 2000  AST -- 24  ALT -- 9  ALKPHOS -- 103  BILITOT --  0.2*  PROT -- 5.5*  ALBUMIN 1.6* 1.8*   A:  No active issues. P: -->  Monitor  HEMATOLOGIC  Lab 09/11/11 0825 09/10/11 2000  HGB -- 8.3*  HCT -- 25.6*  PLT -- 423*  INR 1.61* 1.68*  APTT -- 45*   A:  Anemia, etiology is not clear.  No overt hemorrhage.  On Xarelto prior to admission. P: -->  CBC in AM -->  Hold Xarelto  INFECTIOUS  Lab 09/10/11 2000  WBC 13.4*  PROCALCITON --   A:  Infected R hip prosthesis, s/p removal and ABX spacer placement on 2/18.  Suspected aspiration pneumonia. P: -->  Maxipime, Rocephin, Levaquin -->  Send PCT and sputum Cx  ENDOCRINE  Lab 09/12/11 1713 09/05/11 2152  GLUCAP 76 103*   A:  No history of DM.  Normal TSH this admission. P: -->  SSI  BEST PRACTICE / DISPOSITION -->  ICU status under PCCM -->  Full code -->  NPO -->  Lovenox [place] for DVT Px -->  Protonix IV for GI Px -->  Ventilator bundle -->  Family updated at bedside  The patient is critically ill with multiple organ systems failure and requires high complexity decision making for assessment and support, frequent evaluation and titration of therapies, application of advanced monitoring technologies and extensive interpretation of multiple databases. Critical Care Time devoted to patient care services described in this note is 45 minutes.  Penne Lash, M.D. Pulmonary and [facility] Cell: [phone] Pager: [phone]  09/12/2011, 5:20 PM

## 2011-09-12 NOTE — Procedures (Signed)
Name: [name] MRN: [id number] DOB: 1946-01-29   PROCEDURE NOTE  Procedure:  Endotracheal intubation.  Indication:  Acute respiratory failure  Consent:  Consent was implied due to the emergency nature of the procedure.  Anesthesia:  A total of 10 mg of Etomidate was given intravenously.  Procedure summary:  Appropriate equipment was assembled. The patient was identified as [name] and safety timeout was performed. The patient was placed supine, with head in sniffing position. After adequate level of anesthesia was achieved, a Mac 3 blade was inserted into the oropharynx and the vocal cords were visualized. A 7.5 endotracheal tube was inserted without difficulty and visualized going through the vocal cords. The stylette was removed and cuff inflated. Colorimetric change was noted on the CO2 meter. Breath sounds were heard over both lung fields equally. Post procedure chest xray was ordered.  Complications:  No immediate complications were noted.  Hemodynamic parameters and oxygenation remained stable throughout the procedure.    Penne Lash, M.D. Pulmonary and [place] Cell: [phone] Pager: [phone]  09/12/2011, 6:48 PM

## 2011-09-12 NOTE — Progress Notes (Signed)
Subjective: No better today, she is very somnolent, not responding verbally like she was yesterday.    Objective Vital signs in last 24 hours: Filed Vitals:   09/11/11 2000 09/11/11 2347 09/12/11 0000 09/12/11 0400  BP: 115/51  112/57 112/65  Pulse: 70 74 73   Temp: 98.2 F (36.8 C)  97.6 F (36.4 C) 98.6 F (37 C)  TempSrc: Oral  Oral Oral  Resp: 13 13 13    Height:      Weight:      SpO2: 93% 96% 99%    Weight change: -44.124 kg (-97 lb 4.4 oz)  Intake/Output Summary (Last 24 hours) at 09/12/11 0858 Last data filed at 09/12/11 0600  Gross per 24 hour  Intake   2190 ml  Output    300 ml  Net   1890 ml   Labs: Basic Metabolic Panel:  Lab [id number] 09/10/11 2000 09/06/11 0810  NA 137 137 130*  K 4.0 4.1 4.1  CL 105 102 99  CO2 24 26 25   GLUCOSE 90 97 109*  BUN 56* 56* 44*  CREATININE 5.32* 5.16* 2.34*  ALB -- -- --  CALCIUM 11.9* 12.7* 10.7*  PHOS -- -- --   Liver Function Tests:  Lab 09/10/11 2000  AST 24  ALT 9  ALKPHOS 103  BILITOT 0.2*  PROT 5.5*  ALBUMIN 1.8*   No results found for this basename: LIPASE:3,AMYLASE:3 in the last 168 hours No results found for this basename: AMMONIA:3 in the last 168 hours CBC:  Lab 09/10/11 2000  WBC 13.4*  NEUTROABS 8.8*  HGB 8.3*  HCT 25.6*  MCV 86.8  PLT 423*   PT/INR: @labrcntip (inr:5) Cardiac Enzymes: No results found for this basename: CKTOTAL:5,CKMB:5,CKMBINDEX:5,TROPONINI:5 in the last 168 hours CBG:  Lab 09/05/11 2152  GLUCAP 103*    Iron Studies:  Lab 09/11/11 0210  IRON 35*  TIBC 122*  TRANSFERRIN --  FERRITIN 218   Studies/Results: Dg Chest 1 View  09/10/2011  *RADIOLOGY REPORT*  Clinical Data: Altered mental status.  CHEST - 1 VIEW  Comparison: 09/04/2011  Findings: The patient has a right-sided PICC line, tip to the superior vena cava.  The heart is enlarged.  There is dense opacity at the left lung base which partially obscures the lateral hemidiaphragm.  Findings are consistent  with infectious infiltrate. No evidence for pulmonary edema.  No evidence for pneumothorax. There is degenerative change in the spine.  IMPRESSION:  1.  Cardiomegaly without pulmonary edema. 2.  Left lower lobe infiltrate.  Original Report Authenticated By: Glenice Bow, M.D.   Ct Head Wo Contrast  09/10/2011  *RADIOLOGY REPORT*  Clinical Data: Confusion, weakness, previous stroke.  CT HEAD WITHOUT CONTRAST  Technique:  Contiguous axial images were obtained from the base of the skull through the vertex without contrast.  Comparison: 09/23/2008  Findings: The previously noted right thalamic hemorrhage has resolved. Atherosclerotic and physiologic intracranial calcifications.  Mild atrophy . There is no evidence of acute intracranial hemorrhage, brain edema, mass lesion, acute infarction,   mass effect, or midline shift. Acute infarct may be inapparent on noncontrast CT.  No other intra-axial abnormalities are seen, and the ventricles and sulci are within normal limits in size and symmetry.   No abnormal extra-axial fluid collections or masses are identified.  No significant calvarial abnormality.  IMPRESSION: 1. Negative for bleed or other acute intracranial process.  Original Report Authenticated By: Trecia Rogers, M.D.   US Abdomen Complete  09/11/2011  *RADIOLOGY  REPORT*  Clinical Data:  Elevated INR, liver disease, morbid obesity, prior cholecystectomy  ULTRASOUND ABDOMEN:  Technique:  Sonography of upper abdominal structures was performed. Examination limited by body habitus and bowel gas.  Comparison:  Renal ultrasound 09/03/2006  Gallbladder:  Surgically absent  Common bile duct:  8 mm diameter, may be normal post cholecystectomy  Liver:  Normal appearance  IVC:  Normal appearance  Pancreas:  Suboptimally visualized due to bowel gas, with portions of head and tail obscured.  Visualized portions normal appearance.  Spleen:  Normal appearance, 5.9 cm length  Right kidney:  10.8 cm length.   Increased cortical echogenicity. Small cyst 1.1 x 1.2 x 1.0 cm.  No gross hydronephrosis or shadowing calcification.  Left kidney:  Suboptimally visualized.  Approximately 10.7 cm length.  Marked cortical thinning.  Increased cortical echogenicity.  No gross mass or hydronephrosis seen on limited assessment.  Aorta:  Proximally normal caliber, remainder obscured by bowel gas.  Other:  No free fluid  IMPRESSION: Medical renal disease changes of both kidneys with cortical atrophy of left kidney. Post cholecystectomy with slightly dilated common bile duct 8 mm diameter, which may be physiologic; recommend correlation with LFTs. Incomplete visualization of pancreas, aorta and left kidney.  Original Report Authenticated By: Burnetta Sabin, M.D.   Medications:    . DISCONTD: sodium chloride Stopped (09/11/11 0930)  . DISCONTD: sodium chloride 1,000 mL (09/12/11 0400)      . allopurinol  100 mg Oral Daily  . ceFEPime (MAXIPIME) IV  1 g Intravenous Q24H  . diltiazem  300 mg Oral QAC breakfast  . docusate sodium  100 mg Oral BID  . enoxaparin (LOVENOX) injection  30 mg Subcutaneous Daily  . furosemide  80 mg Intravenous Q6H  . levofloxacin (LEVAQUIN) IV  500 mg Intravenous Q48H  . metoprolol succinate  100 mg Oral QAC breakfast  . naloxone      . naloxone (NARCAN) injection  0.4 mg Intravenous Once  . saccharomyces boulardii  250 mg Oral BID  . sotalol  80 mg Oral BID  . DISCONTD: cefTRIAXone (ROCEPHIN)  IV  1 g Intravenous QHS  . DISCONTD: enoxaparin (LOVENOX) injection  30 mg Subcutaneous Q24H  . DISCONTD: HYDROcodone-acetaminophen  1-2 tablet Oral Q4H  . DISCONTD: sertraline  50 mg Oral QAC breakfast    I  have reviewed scheduled and prn medications.  Physical Exam:  Blood pressure 112/65, pulse 73, temperature 98.6 F (37 C), temperature source Oral, resp. rate 13, height 5\' 5"  (1.651 m), weight 122.8 kg (270 lb 11.6 oz), SpO2 99.00%.  Gen: obese BF, in bed, drowsy, slurred speech, gross  myoclonic jerking periodically, esp with movement  Skin: no rash, cyanosis  Neck: no JVD, bruits or LAN  Chest: clear bilat, no rales, wheezes or distress  Heart: regular, no rub or gallop  Abdomen: soft, markedly obese, no ascites or masses, nontender  Ext: 1+ edema of L leg, 2+ edema of RLE, R hip wound long, stapled, no active drainage, mild erythema  Neuro: no Focal deficit, see also above  Heme/Lymph: no bruising or LAN  Assessment/Plan  1. Acute on CRF, unclear cause. UA shows mild microhemautria, prot 100. 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 UNa is 31. No better in terms of MS overnight. She was receiving narcotics up until yest am - will give IV narcan x 1.  If no response will need acute HD.  Hypercalcemia may  be affecting MS as well.   2. S/P recent removal of hardware due to infected THR joint (R)  3. HTN  4. Hx IC bleed  5. Afib on sotalol, BB  6. CKD baseline creat 2.3, was on 80-160 po lasix bid at home  7. Gout on allopurinol, reduced dose is appropriate  8. Probable PNA, LLL- on IV abx per primary 9. Volume-  Worsening edema LE's, d/c IVF's and give IV lasix today 10. Hypercalcemia- she was on vit D at home, probably iatrogenic.  Adj Ca was 13.6 yesterday, could by affecting MS. Labs pending today. Start SQ calcitonin for rapid reduction in Ca levels.   Kelly Splinter  MD [employer] [phone] pgr    [phone] cell 09/12/2011, 8:58 AM

## 2011-09-12 NOTE — Progress Notes (Signed)
Received patient to room 2110 at 1830.  VSS. Obtunded. RA. Pox 100%.  Does not follow commands.  Plan to intubate to protect airway per Dr. Earnest Conroy .  Family at bedside and updated.  Moves right arm and leg spontaneously.

## 2011-09-12 NOTE — Progress Notes (Signed)
Pt. Being transferred to 2110 via bed at 17:22, Dr. Thereasa Solo has paged CCM who saw pt. And determined that pt. Needed to be moved to 2100. Husband and dghtr. Present and aware of the transfer. Verbal report was given to staff member upon arrival to the unit.

## 2011-09-12 NOTE — Progress Notes (Signed)
[facility] TEAM 8  Subjective: This is a 66 year old female, who is currently residing at [facility]. She recently had removal of hardware from right hip, with spacer placement due to infection. Today she started "talking out of her head"and was sent to the ER. There is no report of any fevers. She has chronic chills, she had some nausea and vomiting today. She reports some mild diarrhea.   Upon entering the room, I find a very lethargic pt who appears to be in resp distress.  Her respirations appear to be quite shallow and ineffective.  She will occasionally open her eyes to the examiner, but she is not able to provide a hx or answer even short questions.  Her daughter and husband are at the bedside, and report that she is much more sedated than she was even yesterday.  Objective: Weight change: -44.124 kg (-97 lb 4.4 oz)  Intake/Output Summary (Last 24 hours) at 09/12/11 1131 Last data filed at 09/12/11 0600  Gross per 24 hour  Intake   2190 ml  Output    300 ml  Net   1890 ml   Blood pressure 112/65, pulse 73, temperature 98.6 F (37 C), temperature source Oral, resp. rate 13, height 5\' 5"  (1.651 m), weight 122.8 kg (270 lb 11.6 oz), SpO2 99.00%.  Physical Exam: General: poor respiratory mechanics, in acute resp distress as discussed above Lungs: distant bs th/o - no wheeze - very poor air movement  Cardiovascular: Regular rate and rhythm without murmur gallop or rub - HS are quite distant Abdomen: obese, soft, bs hypo, no appreciable mass, soft Extremities: 3+ edema B LE   Lab Results:  Basename 09/12/11 0915 09/11/11 0825 09/10/11 2000  NA 142 137 137  K 5.3* 4.0 4.1  CL 109 105 102  CO2 21 24 26   GLUCOSE 94 90 97  BUN 62* 56* 56*  CREATININE 5.85* 5.32* 5.16*  CALCIUM 11.8* 11.9* 12.7*  MG -- -- --  PHOS 4.8* -- --    Basename 09/12/11 0915 09/10/11 2000  AST -- 24  ALT -- 9  ALKPHOS -- 103  BILITOT -- 0.2*  PROT -- 5.5*  ALBUMIN 1.6*  1.8*    Basename 09/10/11 2000  WBC 13.4*  NEUTROABS 8.8*  HGB 8.3*  HCT 25.6*  MCV 86.8  PLT 423*   Micro Results: Recent Results (from the past 240 hour(s))  GRAM STAIN     Status: Normal   Collection Time   09/03/11  8:08 AM      Component Value Range Status Comment   Specimen Description FLUID SYNOVIAL RIGHT HIP ON SWAB   Final    Special Requests NONE   Final    Gram Stain     Final    Value: ABUNDANT WBC PRESENT, PREDOMINANTLY PMN     NO ORGANISMS SEEN     Gram Stain Report Called to,Read Back By and Verified With:     DAVENPORT,S. RN AT [id number] ON 09/03/11 BY GILLESPIE,B.   Report Status 09/03/2011 FINAL   Final   BODY FLUID CULTURE     Status: Normal   Collection Time   09/03/11  8:09 AM      Component Value Range Status Comment   Specimen Description FLUID SYNOVIAL RIGHT HIP ON SWAB   Final    Special Requests NONE   Final    Gram Stain     Final    Value: ABUNDANT WBC PRESENT, PREDOMINANTLY PMN  NO ORGANISMS SEEN     Gram Stain Report Called to,Read Back By and Verified With: Gram Stain Report Called to,Read Back By and Verified With: Creswell S RN @ [phone] ON 09/03/11 BY Ethel Rana B Performed by [facility]   Culture     Final    Value: RARE PSEUDOMONAS AERUGINOSA     Note: CRITICAL RESULT CALLED TO, READ BACK BY AND VERIFIED WITH: KIMBERLY OWENS   Report Status 09/06/2011 FINAL   Final    Organism ID, Bacteria PSEUDOMONAS AERUGINOSA   Final   ANAEROBIC CULTURE     Status: Normal   Collection Time   09/03/11  8:10 AM      Component Value Range Status Comment   Specimen Description FLUID SYNOVIAL RIGHT HIP   Final    Special Requests NONE   Final    Gram Stain     Final    Value: NO WBC SEEN     NO SQUAMOUS EPITHELIAL CELLS SEEN     NO ORGANISMS SEEN   Culture NO ANAEROBES ISOLATED   Final    Report Status 09/07/2011 FINAL   Final   CULTURE, BLOOD (ROUTINE X 2)     Status: Normal (Preliminary result)   Collection Time   09/10/11  7:55 PM       Component Value Range Status Comment   Specimen Description BLOOD   Final    Special Requests BOTTLES DRAWN AEROBIC AND ANAEROBIC 3CC   Final    Culture  Setup Time [id number]   Final    Culture     Final    Value:        BLOOD CULTURE RECEIVED NO GROWTH TO DATE CULTURE WILL BE HELD FOR 5 DAYS BEFORE ISSUING A FINAL NEGATIVE REPORT   Report Status PENDING   Incomplete   CULTURE, BLOOD (ROUTINE X 2)     Status: Normal (Preliminary result)   Collection Time   09/10/11  8:00 PM      Component Value Range Status Comment   Specimen Description BLOOD LEFT ARM   Final    Special Requests BOTTLES DRAWN AEROBIC AND ANAEROBIC 4CC   Final    Culture  Setup Time [id number]   Final    Culture     Final    Value:        BLOOD CULTURE RECEIVED NO GROWTH TO DATE CULTURE WILL BE HELD FOR 5 DAYS BEFORE ISSUING A FINAL NEGATIVE REPORT   Report Status PENDING   Incomplete   URINE CULTURE     Status: Normal   Collection Time   09/10/11 10:53 PM      Component Value Range Status Comment   Specimen Description URINE, CATHETERIZED   Final    Special Requests NONE   Final    Culture  Setup Time [id number]   Final    Colony Count NO GROWTH   Final    Culture NO GROWTH   Final    Report Status 09/12/2011 FINAL   Final   MRSA PCR SCREENING     Status: Normal   Collection Time   09/11/11 12:48 AM      Component Value Range Status Comment   MRSA by PCR NEGATIVE  NEGATIVE  Final     Studies/Results: All recent x-ray/radiology reports have been reviewed in detail.   Medications: I have reviewed the patient's complete medication list.  Assessment/Plan:  HCAP  obtundation ABG suggests resp acidosis - there may be a contribution from  metabolic acidosis as well, but she is clearly not compensating with depressed resp effort and very shallow inspiration/elevated pCO2 - I fear that she may require intubation - I have consulted PCCM for an eval  Acute on chronic kidney disease Baseline crt 2.3 - crt 5.1  at presentation - crt is climbing - pt now has a HD cath and reportedly had 4 hrs of HD today - unfortunately, this has had little impact on her renal fxn  hyperkalemia As per Nephrology - is now receiving HD  afib Controlled at present  HTN Not an active issue at this time  Morbid obesity  Sleep apnea  Chronic anemia  Follow hgb  Hypercalcemia ? Due to her joint infection - improving - cont current med tx  Coagulopathy No clear etio - recheck in AM  Cherene Altes, MD [facility] Office  [phone] Pager [phone]  On-Call/Text Page:      [email or web address]      password [facility]

## 2011-09-13 ENCOUNTER — Inpatient Hospital Stay (HOSPITAL_COMMUNITY): Payer: Medicare Other

## 2011-09-13 DIAGNOSIS — J9600 Acute respiratory failure, unspecified whether with hypoxia or hypercapnia: Secondary | ICD-10-CM

## 2011-09-13 DIAGNOSIS — G9340 Encephalopathy, unspecified: Secondary | ICD-10-CM

## 2011-09-13 DIAGNOSIS — N179 Acute kidney failure, unspecified: Secondary | ICD-10-CM

## 2011-09-13 LAB — POCT I-STAT 3, ART BLOOD GAS (G3+)
Acid-base deficit: 2 mmol/L (ref 0.0–2.0)
Bicarbonate: 21.7 mEq/L (ref 20.0–24.0)
O2 Saturation: 100 %
Patient temperature: 98.4
TCO2: 23 mmol/L (ref 0–100)
pCO2 arterial: 30.4 mmHg — ABNORMAL LOW (ref 35.0–45.0)
pH, Arterial: 7.461 — ABNORMAL HIGH (ref 7.350–7.400)
pO2, Arterial: 162 mmHg — ABNORMAL HIGH (ref 80.0–100.0)

## 2011-09-13 LAB — BASIC METABOLIC PANEL
BUN: 46 mg/dL — ABNORMAL HIGH (ref 6–23)
CO2: 21 mEq/L (ref 19–32)
Calcium: 10.9 mg/dL — ABNORMAL HIGH (ref 8.4–10.5)
Chloride: 105 mEq/L (ref 96–112)
Creatinine, Ser: 5.04 mg/dL — ABNORMAL HIGH (ref 0.50–1.10)
GFR calc Af Amer: 10 mL/min — ABNORMAL LOW (ref >90)
GFR calc non Af Amer: 8 mL/min — ABNORMAL LOW (ref >90)
Glucose, Bld: 99 mg/dL (ref 70–99)
Potassium: 3.3 mEq/L — ABNORMAL LOW (ref 3.5–5.1)
Sodium: 140 mEq/L (ref 135–145)

## 2011-09-13 LAB — CBC
HCT: 22.5 % — ABNORMAL LOW (ref 36.0–46.0)
Hemoglobin: 7.5 g/dL — ABNORMAL LOW (ref 12.0–15.0)
MCH: 28 pg (ref 26.0–34.0)
MCHC: 33.3 g/dL (ref 30.0–36.0)
MCV: 84 fL (ref 78.0–100.0)
Platelets: 290 10*3/uL (ref 150–400)
RBC: 2.68 MIL/uL — ABNORMAL LOW (ref 3.87–5.11)
RDW: 16.6 % — ABNORMAL HIGH (ref 11.5–15.5)
WBC: 13.9 10*3/uL — ABNORMAL HIGH (ref 4.0–10.5)

## 2011-09-13 LAB — GLUCOSE, CAPILLARY
Glucose-Capillary: 105 mg/dL — ABNORMAL HIGH (ref 70–99)
Glucose-Capillary: 112 mg/dL — ABNORMAL HIGH (ref 70–99)
Glucose-Capillary: 120 mg/dL — ABNORMAL HIGH (ref 70–99)
Glucose-Capillary: 127 mg/dL — ABNORMAL HIGH (ref 70–99)
Glucose-Capillary: 81 mg/dL (ref 70–99)
Glucose-Capillary: 94 mg/dL (ref 70–99)

## 2011-09-13 LAB — RENAL FUNCTION PANEL
Albumin: 1.7 g/dL — ABNORMAL LOW (ref 3.5–5.2)
BUN: 48 mg/dL — ABNORMAL HIGH (ref 6–23)
CO2: 22 mEq/L (ref 19–32)
Calcium: 11 mg/dL — ABNORMAL HIGH (ref 8.4–10.5)
Chloride: 104 mEq/L (ref 96–112)
Creatinine, Ser: 5.16 mg/dL — ABNORMAL HIGH (ref 0.50–1.10)
GFR calc Af Amer: 9 mL/min — ABNORMAL LOW (ref >90)
GFR calc non Af Amer: 8 mL/min — ABNORMAL LOW (ref >90)
Glucose, Bld: 98 mg/dL (ref 70–99)
Phosphorus: 2.7 mg/dL (ref 2.3–4.6)
Potassium: 3.2 mEq/L — ABNORMAL LOW (ref 3.5–5.1)
Sodium: 139 mEq/L (ref 135–145)

## 2011-09-13 LAB — HEPATITIS B CORE ANTIBODY, TOTAL: Hep B Core Total Ab: NEGATIVE

## 2011-09-13 LAB — PHOSPHORUS: Phosphorus: 2.7 mg/dL (ref 2.3–4.6)

## 2011-09-13 LAB — MAGNESIUM: Magnesium: 2 mg/dL (ref 1.5–2.5)

## 2011-09-13 IMAGING — CR DG CHEST 1V PORT
1 series · 1 of 1 positions shown · non-contrast
Comparison: [DATE].

CLINICAL DATA: Endotracheal tube.

PORTABLE CHEST - 1 VIEW

[view not recorded]
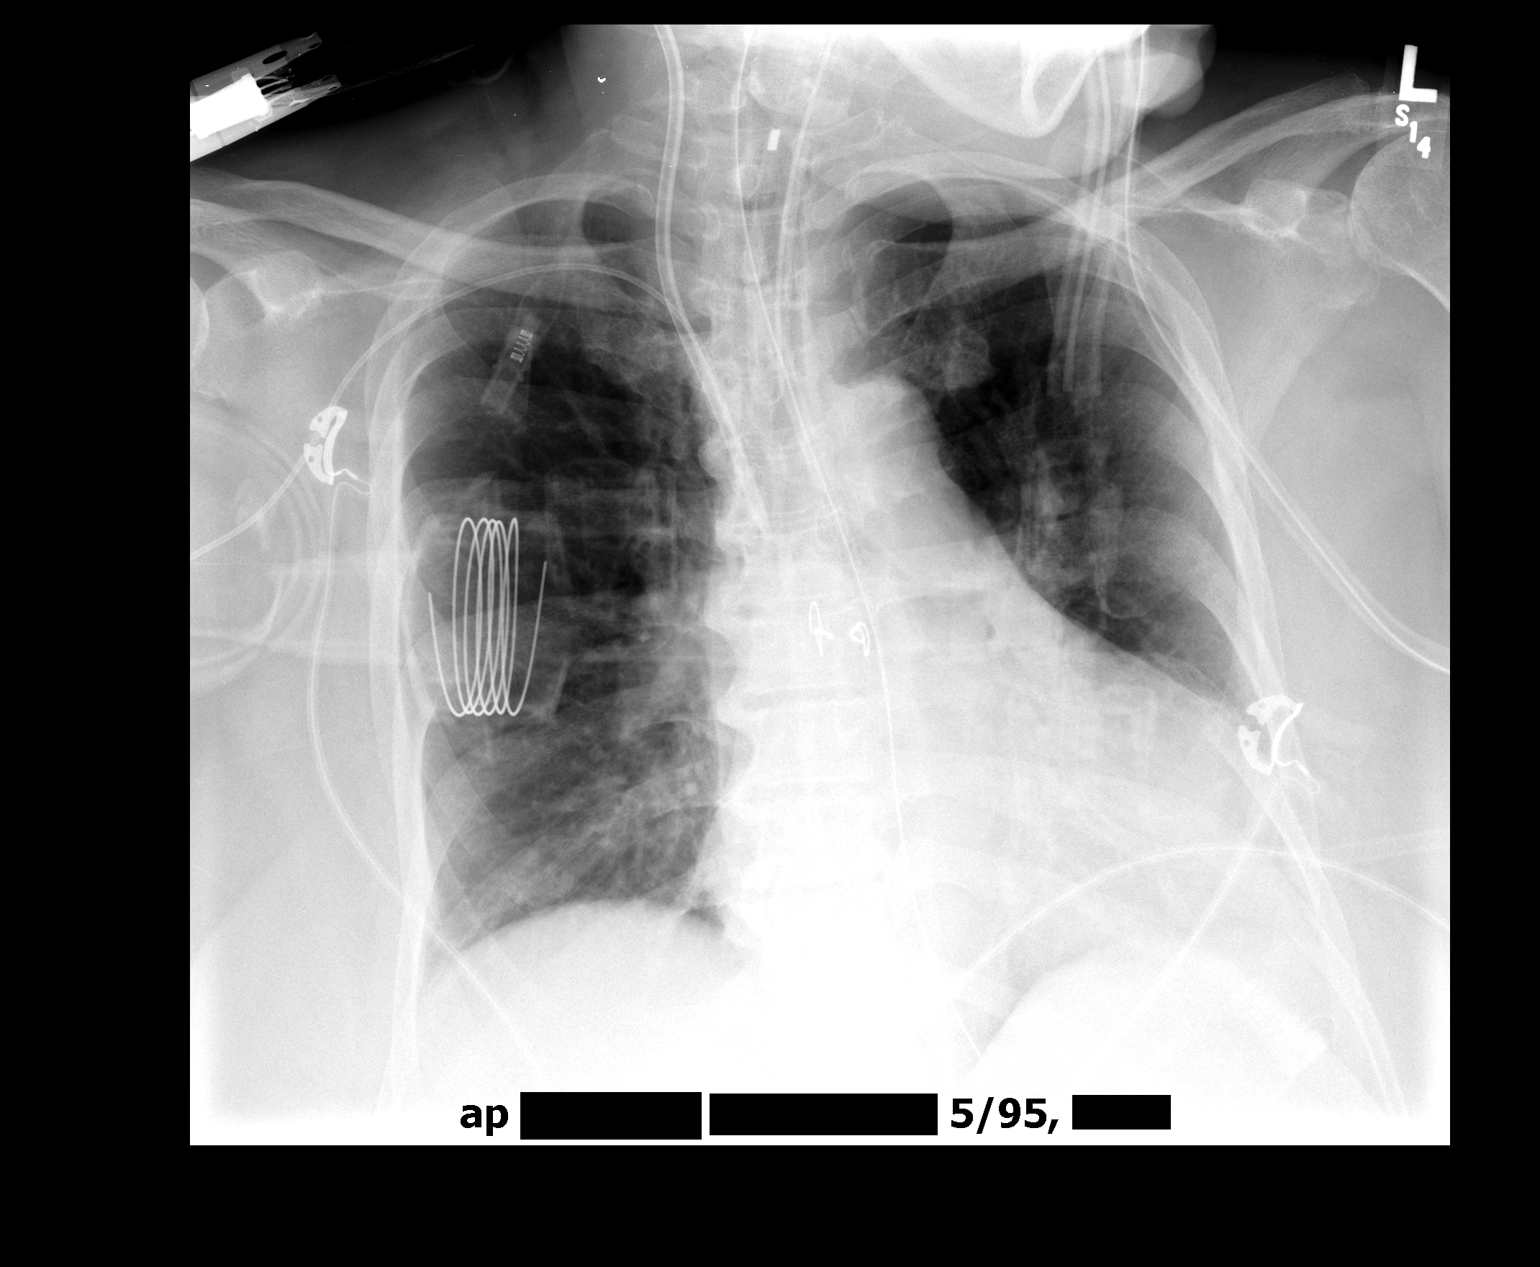

[1 of 1 positions shown; findings below may reference images not displayed]

FINDINGS: Endotracheal tube is in satisfactory position.
Nasogastric tube is followed into the stomach, with the tip
projecting beyond the inferior boundary of the film.  Right IJ
central line and right PICC tips project over the SVC.  Heart is at
the upper limits of normal in size, stable.  Left perihilar and
right infrahilar air space disease persists.  Question left pleural
effusion.
IMPRESSION: Left perihilar and right basilar airspace disease persist.
Probable left pleural effusion.

## 2011-09-13 MED ORDER — NYSTATIN 100000 UNIT/GM EX POWD: Freq: Three times a day (TID) | CUTANEOUS | Status: DC | PRN

## 2011-09-13 MED ORDER — SODIUM CHLORIDE 0.9 % IJ SOLN
INTRAMUSCULAR | Status: AC
  Administered 2011-09-13: 11:00:00
  Filled 2011-09-13: qty 10

## 2011-09-13 MED ORDER — POTASSIUM CHLORIDE 10 MEQ/50ML IV SOLN
10.0000 meq | INTRAVENOUS | Status: AC
  Administered 2011-09-13 (×2): 10 meq via INTRAVENOUS
  Filled 2011-09-13: qty 50

## 2011-09-13 MED ORDER — NEPRO/CARBSTEADY PO LIQD
1000.0000 mL | ORAL | Status: DC
  Filled 2011-09-13: qty 1000

## 2011-09-13 MED ORDER — POTASSIUM CHLORIDE 10 MEQ/50ML IV SOLN
INTRAVENOUS | Status: AC
  Filled 2011-09-13: qty 50

## 2011-09-13 MED ORDER — FUROSEMIDE 10 MG/ML IJ SOLN
INTRAMUSCULAR | Status: AC
  Administered 2011-09-13: 80 mg
  Filled 2011-09-13: qty 8

## 2011-09-13 MED ORDER — POTASSIUM CHLORIDE 10 MEQ/100ML IV SOLN: 10.0000 meq | INTRAVENOUS | Status: DC

## 2011-09-13 MED ORDER — CEFTAZIDIME 2 G IJ SOLR
2.0000 g | INTRAMUSCULAR | Status: DC
  Administered 2011-09-13 – 2011-09-19 (×4): 2 g via INTRAVENOUS
  Filled 2011-09-13 (×4): qty 2

## 2011-09-13 NOTE — Progress Notes (Signed)
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  She underwent I & D on 2/18 and discharge on 2/21 on Xarelto.  Presented to ED on 2/25 with confusion, slurred speech from nursing facility.  Known to have CKD with baseline creatinine of 2.3 (as of 2/210.  Initial cr of 5.1 on admit with AMS, hypercalcemia.  CT head was negative on 2/25.  2/27 afternoon was noted to have significantly altered mental status by [facility] after HD (1.2 L removed) and PCCM consulted.    Lines / Drains: 2/27 OETT>>> 2/27 OGT>>> ? PICC line>>>  Cultures: 2/25 Urine>>>negative 2/25 Blood>>>NGTD 2/27 Sputum >>  Antibiotics: 2/25 Maxipime>>> 2/26 Rocephin>> 2/26 2/25 Levaquin>>>  Tests / Events: 2/25 CT HEAD>>>Negative for bleed or other acute intracranial process 2/27 Head CT >>>Stable noncontrast CT appearance the brain without acute intracranial abnormality.   Overnight Events: After being transferred to 2100 and intubated, patient remained stable. Right hip wound with foul smelling drainage.   OBJECTIVE Vital Signs: Filed Vitals:   09/13/11 0432 09/13/11 0447 09/13/11 0500 09/13/11 0600  BP: 139/72  145/73 133/68  Pulse: 83  83 61  Temp:  98.4 F (36.9 C)    TempSrc:  Oral    Resp: 16  16 17   Height:      Weight:      SpO2: 99%  97% 100%    I/O last 3 completed shifts: In: 2190 [P.O.:240; I.V.:1950] Out: 2040 [Urine:300; Other:1740]  Physical Examination: General:  Chronically ill in no acute distress Neuro:  Somnlent, follows commands HEENT:  PERRL, pink conjunctivae, face asymmetry (new), exophthalmus, ETT and OGT in place  Cardiovascular:  RRR, no M/R/G Lungs:  Bilateral diminished air entry, no W/R/R, some rhonchi  Abdomen:  Soft, nontender,  nondistended, bowel sounds present Musculoskeletal:  Moves all extremities, no pedal edema  Ventilator settings: Vent Mode:  [-] PRVC FiO2 (%):  [40 %-100 %] 40 % Set Rate:  [16 bmp] 16 bmp Vt Set:  [600 mL] 600 mL PEEP:  [5 cmH20] 5 cmH20 Plateau Pressure:  [20 cmH20-22 cmH20] 21 cmH20  Labs and Imaging:  Reviewed.  Please refer to the Assessment and Plan section for relevant results.  ASSESSMENT AND PLAN  NEUROLOGIC A:  Encephalopathy, multifactorial (uremia, hypercalcemia, Gabapentin / Hydrocodone). R/o infectious etiology,  Worsening face asymmetry.  History of CVA (hemorrhagic).  Negative head CT on 2/25. P: -->  Repeat head CT showing no evolving stroke or new bleed as cause of AMS --< MRI brain today -->  Versed dc , do not restart --> use int fentanyl -->  WUA ---> poor clinical h/o for seizures, hold off eeg --> LP consideration if no other answers on MRI ---> treat sepsis See ABX changes   PULMONARY  Lab 09/13/11 0634 09/12/11 1938 09/12/11 0922  PHART 7.461* 7.499* 7.258*  PCO2ART 30.4* 31.1* 48.1*  PO2ART 162.0* 453.0* 68.1*  HCO3 21.7 24.3* 20.8  O2SAT 100.0 100.0 93.6   A:  Intubated for airway protection.  Combined metabolic and respiratory acidosis.  History of Asthma and OSA on CPAP.  Possible aspiration pneumonia / pneumonitis as food particles aspirated by ETT Sx. P: --> abg reviewed, reduce minute ventilation -weaning attempt, sbt, goal cpap 5ps 10,  failed ps 10, higher required -pcxr in am   CARDIOVASCULAR  Lab 09/12/11 1830  TROPONINI --  LATICACIDVEN 1.1  PROBNP --   A:  Hemodynamically stable.  No evidence of ischemia.  History of HTN and cardiomyopathy (last known EF 2010 55%).  History of atrial fibrillation, now in sinus rhythm. P: -->  Sotalol, Metoprolol and Cardizem (per preadmission)  Echo assess veg  RENAL  Lab 09/13/11 0450 09/12/11 1830 09/12/11 0915 09/11/11 0825 09/10/11 2000  NA 140 139 142 137 137  K 3.3* 3.5 -- -- --    CL 105 104 109 105 102  CO2 21 22 21 24 26   BUN 46* 42* 62* 56* 56*  CREATININE 5.04* 4.64* 5.85* 5.32* 5.16*  CALCIUM 10.9* 11.0* 11.8* 11.1*11.9* 12.7*  MG 2.0 -- -- -- --  PHOS 2.7 -- 4.8* -- --   A:  Acute on chronic renal failure.  Hypercalcemia (corrected Ca 12.9).  Hyperphosphatemia. P: -->  HD per Renal -->  Calcitonin and Prednisone, Ca slowly decreasing  -->  Continue to monitor BMET and Ca k supp  GASTROINTESTINAL  Lab 09/12/11 0915 09/10/11 2000  AST -- 24  ALT -- 9  ALKPHOS -- 103  BILITOT -- 0.2*  PROT -- 5.5*  ALBUMIN 1.6* 1.8*   A:  No active issues. P: -->  Monitor -->  NPO Start TF  HEMATOLOGIC  Lab 09/13/11 0450 09/12/11 1830 09/11/11 0825 09/10/11 2000  HGB 7.5* 7.7* -- 8.3*  HCT 22.5* 23.6* -- 25.6*  PLT 290 283 -- 423*  INR -- -- 1.61* 1.68*  APTT -- -- -- 45*   A:  Anemia, etiology is not clear.  No overt hemorrhage.  On Xarelto prior to admission. P: -->  CBC in AM -->  Hold Xarelto --> last inr 1.6, dc lovenox, recheck coags in am , then add sub q hep  INFECTIOUS  Lab 09/13/11 0450 09/12/11 1830 09/10/11 2000  WBC 13.9* 13.4* 13.4*  PROCALCITON -- 0.21 --   A:  Infected R hip prosthesis, s/p removal and ABX spacer placement on 2/18.  Suspected aspiration pneumonia. P: -->  Maxipime dc , add ceftaz for BBB, add vanc Continue levofloxacin for now, although not planning double coverage pseudo if isolated -->  Sent sputum Cx, reincubated MRI brain , may need LP Hip re eval Dr Alvan Dame, ortho Add MRI hip to head  ENDOCRINE  Lab 09/13/11 0422 09/12/11 2351 09/12/11 2006 09/12/11 1859 09/12/11 1713  GLUCAP 94 81 73 81 76   A:  No history of DM.  Normal TSH this admission. P: -->  SSI --> no hypotension  BEST PRACTICE / DISPOSITION -->  ICU status under PCCM -->  Full code -->  NPO -->  Lovenox [place] for DVT Px -->  Protonix IV for GI Px -->  Ventilator bundle -->  Family updated at bedside  Ccm 35 min    MCGILL,JACQUELYN 09/13/2011, 6:48 AM   Lavon Paganini. Titus Mould, MD, [place] Pgr: [facility]

## 2011-09-13 NOTE — Progress Notes (Signed)
CSW received referral for pt admitted from SNF. CSW completed psychosocial assessment (located in shadow chart). Pt family very appreciative of CSW support.    Jerral Ralph, MSW [phone]

## 2011-09-13 NOTE — Progress Notes (Addendum)
INITIAL ADULT NUTRITION ASSESSMENT Date: 09/13/2011   Time: 10:36 AM  Reason for Assessment: MD Consult for TF initiation and management  ASSESSMENT: Female 66 y.o.  Dx: Pneumonia  Hx:  Past Medical History  Diagnosis Date  . Hypertension   . Depression   . Fractures, stress     in both feet  . Kidney disease     pt states creatinine is high  . 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 Stroke   . Gout   . Asthma     MILD NOW-NO INHALERS  . Sleep apnea     USES CPAP  . Anemia   . Blood transfusion   . Arthritis     HIP  . Atrial fibrillation     PT TAKES ASPIRIN AS BLOOD THINNER-HX OF CEREBRAL BLEED WHILE ON COUMADIN  . Arrhythmia   . Cardiomyopathy   . Hypercalcemia     09/10/11  . HCAP (healthcare-associated pneumonia)     09/10/11    Related Meds:  Scheduled Meds:   . albuterol-ipratropium  6 puff Inhalation Q4H  . antiseptic oral rinse  1 application Mouth Rinse QID  . calcitonin  480 Units Subcutaneous BID  . cefTAZidime (FORTAZ)  IV  2 g Intravenous Q48H  . chlorhexidine  15 mL Mouth/Throat BID  . diltiazem  60 mg Oral Q6H  . etomidate      . fentaNYL      . furosemide      . furosemide  80 mg Intravenous Q6H  . insulin aspart  0-3 Units Subcutaneous Q4H  . levofloxacin (LEVAQUIN) IV  500 mg Intravenous Q48H  . metoprolol tartrate  50 mg Oral BID  . midazolam      . pantoprazole (PROTONIX) IV  40 mg Intravenous Q24H  . potassium chloride  10 mEq Intravenous Q1 Hr x 2  . potassium chloride      . predniSONE  30 mg Per Tube Q breakfast  . sodium chloride      . sodium chloride      . sotalol  80 mg Oral Q48H  . vecuronium      . DISCONTD: allopurinol  100 mg Oral Daily  . DISCONTD: ceFEPime (MAXIPIME) IV  1 g Intravenous Q24H  . DISCONTD: diltiazem  300 mg Oral QAC breakfast  . DISCONTD: docusate sodium  100 mg Oral BID  . DISCONTD: enoxaparin (LOVENOX) injection  30 mg Subcutaneous Daily  . DISCONTD: metoprolol succinate  100 mg Oral QAC  breakfast  . DISCONTD: metoprolol succinate  50 mg Oral BID  . DISCONTD: potassium chloride  10 mEq Intravenous Q1 Hr x 2  . DISCONTD: saccharomyces boulardii  250 mg Oral BID  . DISCONTD: sotalol  80 mg Oral BID   Continuous Infusions:   . dextrose 20 mL/hr at 09/12/11 2200  . feeding supplement (NEPRO CARB STEADY)    . fentaNYL infusion INTRAVENOUS 50 mcg/hr (09/13/11 0700)  . propofol 9.623 mcg/kg/min (09/12/11 1902)   PRN Meds:.albuterol-ipratropium, alteplase, fentaNYL, heparin, heparin, heparin, lidocaine, lidocaine-prilocaine, LORazepam, nystatin, pentafluoroprop-tetrafluoroeth, sodium chloride, DISCONTD: sodium chloride, DISCONTD: sodium chloride, DISCONTD: feeding supplement (NEPRO CARB STEADY), DISCONTD: nystatin, DISCONTD: ondansetron   Ht: 5\' 7"  (170.2 cm)  Wt: 274 lb 14.6 oz (124.7 kg)  Ideal Wt: 61.4 kg % Ideal Wt: 203%  Wt Readings from Last 10 Encounters:  09/12/11 274 lb 14.6 oz (124.7 kg)  09/03/11 260 lb (117.935 kg)  09/03/11 260 lb (117.935 kg)  08/28/11 260 lb (117.935 kg)  06/06/11 260 lb 12.9 oz (118.3 kg)  02/02/11 260 lb (117.935 kg)  11/08/10 253 lb (114.76 kg)  10/10/10 252 lb (114.306 kg)  09/27/10 260 lb 9.6 oz (118.207 kg)  10/04/09 246 lb (111.585 kg)   Usual Wt: ~250 lb per patient's daughter % Usual Wt: 110% (up with fluid retention)  Body mass index is 43.06 kg/(m^2). (Class 3 extreme obesity)  Food/Nutrition Related Hx: Patient's family reports that patient had lap band surgery 2.5 years ago.  Was at a nursing facility and ate 3 small meals plus 3 snacks daily.  Intake was good, but patient was still working on trying to lose weight.  Labs:  CMP     Component Value Date/Time   NA 139 09/13/2011 0450   NA 140 09/13/2011 0450   K 3.2* 09/13/2011 0450   K 3.3* 09/13/2011 0450   CL 104 09/13/2011 0450   CL 105 09/13/2011 0450   CO2 22 09/13/2011 0450   CO2 21 09/13/2011 0450   GLUCOSE 98 09/13/2011 0450   GLUCOSE 99 09/13/2011 0450   BUN  48* 09/13/2011 0450   BUN 46* 09/13/2011 0450   CREATININE 5.16* 09/13/2011 0450   CREATININE 5.04* 09/13/2011 0450   CALCIUM 11.0* 09/13/2011 0450   CALCIUM 10.9* 09/13/2011 0450   CALCIUM 11.1* 09/11/2011 0825   PROT 5.5* 09/10/2011 2000   ALBUMIN 1.7* 09/13/2011 0450   AST 24 09/10/2011 2000   ALT 9 09/10/2011 2000   ALKPHOS 103 09/10/2011 2000   BILITOT 0.2* 09/10/2011 2000   GFRNONAA 8* 09/13/2011 0450   GFRNONAA 8* 09/13/2011 0450   GFRAA 9* 09/13/2011 0450   GFRAA 10* 09/13/2011 0450  Phosphorous 2.7 WNL   Intake/Output Summary (Last 24 hours) at 09/13/11 1057 Last data filed at 09/13/11 0800  Gross per 24 hour  Intake  461.6 ml  Output   2220 ml  Net -1758.4 ml   Diet Order:  NPO  Supplements/Tube Feeding:  Nepro with goal rate of 30 ml/h will provide 1296 kcals, 58 grams protein, 523 ml free water daily.   IVF:    dextrose Last Rate: 20 mL/hr at 09/12/11 2200  feeding supplement (NEPRO CARB STEADY)   fentaNYL infusion INTRAVENOUS Last Rate: 50 mcg/hr (09/13/11 0700)  propofol Last Rate: 9.623 mcg/kg/min (09/12/11 1902)  Propofol at 7.2 ml/h is providing 190 kcals/day.  Estimated Nutritional Needs:   Kcal: 2015 Protein: 110-130 grams Fluid: 2-2.2 liters  Patient with possible infected hip.  Possibility for surgery today, therefore, TF initiation to be held until after surgery.  No enteral feeding tube noted.  NUTRITION DIAGNOSIS: -Inadequate oral intake (NI-2.1).  Status: Ongoing  RELATED TO: inability to eat  AS EVIDENCED BY: NPO status  MONITORING/EVALUATION(Goals): Goal:  Enteral nutrition plus Propofol to provide 60-70% of estimated calorie needs (22-25 kcals/kg ideal body weight) and 100% of estimated protein needs, based on ASPEN guidelines for permissive underfeeding in critically ill obese individuals (Calorie goal=1350-1550)  Monitor:  TF tolerance, labs, weight trend.  EDUCATION NEEDS: -Education not appropriate at this time  INTERVENTION:  When able to  begin TF, recommend discontinue Nepro (Potassium is low and Phosphorous is WNL), start Osmolite 1.5 at 15 ml/h, increase by 10 ml every 4 hours to goal of 25 ml/h with Prostat 30 ml 5 times daily to provide 1260 kcals, 113 grams protein daily.  Total intake with Propofol and TF will be 1450 kcals/day.  Recommend liquid MVI daily to help meet 100% RDI's.  Dietitian #:  [phone]  DOCUMENTATION CODES Per approved criteria  -Morbid Obesity    Dalene Carrow 09/13/2011, 10:36 AM

## 2011-09-13 NOTE — Progress Notes (Signed)
ANTIBIOTIC CONSULT NOTE - FOLLOW UP  Pharmacy Consult for Vancomycin Indication: Suspected PNA  Allergies  Allergen Reactions  . Coumadin Other (See Comments)    Caused her to have a stroke    Patient Measurements: Height: 5\' 7"  (170.2 cm) Weight: 274 lb 14.6 oz (124.7 kg) IBW/kg (Calculated) : 61.6   Vital Signs: Temp: 98.7 F (37.1 C) (02/28 0826) Temp src: Oral (02/28 0826) BP: 145/91 mmHg (02/28 0900) Pulse Rate: 86  (02/28 0900) Intake/Output from previous day: 02/27 0701 - 02/28 0700 In: 441.6 [I.V.:333.6; IV Piggyback:108] Out: 2120 [Urine:380] Intake/Output from this shift: Total I/O In: 20 [I.V.:20] Out: 100 [Urine:100]  Labs:  Basename 09/13/11 0450 09/12/11 1830 09/12/11 0915 09/10/11 2000  WBC 13.9* 13.4* -- 13.4*  HGB 7.5* 7.7* -- 8.3*  PLT 290 283 -- 423*  LABCREA -- -- -- --  CREATININE 5.04* 4.64* 5.85* --   Estimated Creatinine Clearance: 15.7 ml/min (by C-G formula based on Cr of 5.04).  Basename 09/12/11 1509 09/12/11 1235  VANCOTROUGH -- --  VANCOPEAK -- --  VANCORANDOM 31.8 37.6  GENTTROUGH -- --  GENTPEAK -- --  GENTRANDOM -- --  TOBRATROUGH -- --  TOBRAPEAK -- --  TOBRARND -- --  AMIKACINPEAK -- --  AMIKACINTROU -- --  AMIKACIN -- --     Microbiology: Recent Results (from the past 720 hour(s))  SURGICAL PCR SCREEN     Status: Abnormal   Collection Time   08/28/11 10:33 AM      Component Value Range Status Comment   MRSA, PCR NEGATIVE  NEGATIVE  Final    Staphylococcus aureus POSITIVE (*) NEGATIVE  Final   GRAM STAIN     Status: Normal   Collection Time   09/03/11  8:08 AM      Component Value Range Status Comment   Specimen Description FLUID SYNOVIAL RIGHT HIP ON SWAB   Final    Special Requests NONE   Final    Gram Stain     Final    Value: ABUNDANT WBC PRESENT, PREDOMINANTLY PMN     NO ORGANISMS SEEN     Gram Stain Report Called to,Read Back By and Verified With:     DAVENPORT,S. RN AT [id number] ON 09/03/11 BY GILLESPIE,B.     Report Status 09/03/2011 FINAL   Final   BODY FLUID CULTURE     Status: Normal   Collection Time   09/03/11  8:09 AM      Component Value Range Status Comment   Specimen Description FLUID SYNOVIAL RIGHT HIP ON SWAB   Final    Special Requests NONE   Final    Gram Stain     Final    Value: ABUNDANT WBC PRESENT, PREDOMINANTLY PMN     NO ORGANISMS SEEN     Gram Stain Report Called to,Read Back By and Verified With: Gram Stain Report Called to,Read Back By and Verified With: Carolanne Grumbling RN @ [phone] ON 09/03/11 BY Ethel Rana B Performed by [facility]   Culture     Final    Value: RARE PSEUDOMONAS AERUGINOSA     Note: CRITICAL RESULT CALLED TO, READ BACK BY AND VERIFIED WITH: KIMBERLY OWENS   Report Status 09/06/2011 FINAL   Final    Organism ID, Bacteria PSEUDOMONAS AERUGINOSA   Final   ANAEROBIC CULTURE     Status: Normal   Collection Time   09/03/11  8:10 AM      Component Value Range Status Comment   Specimen  Description FLUID SYNOVIAL RIGHT HIP   Final    Special Requests NONE   Final    Gram Stain     Final    Value: NO WBC SEEN     NO SQUAMOUS EPITHELIAL CELLS SEEN     NO ORGANISMS SEEN   Culture NO ANAEROBES ISOLATED   Final    Report Status 09/07/2011 FINAL   Final   CULTURE, BLOOD (ROUTINE X 2)     Status: Normal (Preliminary result)   Collection Time   09/10/11  7:55 PM      Component Value Range Status Comment   Specimen Description BLOOD   Final    Special Requests BOTTLES DRAWN AEROBIC AND ANAEROBIC 3CC   Final    Culture  Setup Time [id number]   Final    Culture     Final    Value:        BLOOD CULTURE RECEIVED NO GROWTH TO DATE CULTURE WILL BE HELD FOR 5 DAYS BEFORE ISSUING A FINAL NEGATIVE REPORT   Report Status PENDING   Incomplete   CULTURE, BLOOD (ROUTINE X 2)     Status: Normal (Preliminary result)   Collection Time   09/10/11  8:00 PM      Component Value Range Status Comment   Specimen Description BLOOD LEFT ARM   Final    Special Requests BOTTLES  DRAWN AEROBIC AND ANAEROBIC 4CC   Final    Culture  Setup Time [id number]   Final    Culture     Final    Value:        BLOOD CULTURE RECEIVED NO GROWTH TO DATE CULTURE WILL BE HELD FOR 5 DAYS BEFORE ISSUING A FINAL NEGATIVE REPORT   Report Status PENDING   Incomplete   URINE CULTURE     Status: Normal   Collection Time   09/10/11 10:53 PM      Component Value Range Status Comment   Specimen Description URINE, CATHETERIZED   Final    Special Requests NONE   Final    Culture  Setup Time [id number]   Final    Colony Count NO GROWTH   Final    Culture NO GROWTH   Final    Report Status 09/12/2011 FINAL   Final   MRSA PCR SCREENING     Status: Normal   Collection Time   09/11/11 12:48 AM      Component Value Range Status Comment   MRSA by PCR NEGATIVE  NEGATIVE  Final   CULTURE, RESPIRATORY     Status: Normal (Preliminary result)   Collection Time   09/12/11  6:53 PM      Component Value Range Status Comment   Specimen Description TRACHEAL ASPIRATE   Final    Special Requests NONE   Final    Gram Stain     Final    Value: NO WBC SEEN     MODERATE SQUAMOUS EPITHELIAL CELLS PRESENT     RARE GRAM POSITIVE COCCI IN PAIRS   Culture Culture reincubated for better growth   Final    Report Status PENDING   Incomplete     Anti-infectives     Start     Dose/Rate Route Frequency Ordered Stop   09/12/11 2200   levofloxacin (LEVAQUIN) IVPB 500 mg        500 mg 100 mL/hr over 60 Minutes Intravenous Every 48 hours 09/11/11 0042     09/12/11 0200   ceFEPIme (MAXIPIME) 1 g in  dextrose 5 % 50 mL IVPB  Status:  Discontinued        1 g 100 mL/hr over 30 Minutes Intravenous Every 24 hours 09/11/11 1003 09/13/11 0851   09/11/11 0045   cefTRIAXone (ROCEPHIN) 1 g in dextrose 5 % 50 mL IVPB  Status:  Discontinued        1 g 100 mL/hr over 30 Minutes Intravenous Daily at bedtime 09/11/11 0017 09/11/11 1003   09/10/11 2300   vancomycin (VANCOCIN) 2,000 mg in sodium chloride 0.9 % 500 mL IVPB          2,000 mg 250 mL/hr over 120 Minutes Intravenous  Once 09/10/11 2200 09/11/11 0513   09/10/11 2145   ceFEPIme (MAXIPIME) 1 g in dextrose 5 % 50 mL IVPB        1 g 100 mL/hr over 30 Minutes Intravenous  Once 09/10/11 2138 09/11/11 0241   09/10/11 2145   Levofloxacin (LEVAQUIN) IVPB 750 mg        750 mg 100 mL/hr over 90 Minutes Intravenous  Once 09/10/11 2138 09/10/11 2323          Assessment: 78 YOF on Day #3 Vancomycin/Levaquin, asked to change cefepime to fortaz today for increased BBB penetration, for empiric HCAP, h/o pan S psa hip-infection.  Patient with acute on chronic renal failure. Scr 5.04 - (baseline ~ 2.3), s/p one dose vanc 2g on 2/26 at 0313. Pt s/p first HD 2/27 (2.5hrs), post HD vanc level supratherapeutic (31.8).  No further plans for HD noted at this time.  Goal of Therapy:  Vancomycin trough level 15-20 mcg/ml Appropriate antibiotic dosing.  Plan:  1.  Fortaz 2g IV q48h. 2.  Continue levofloxacin 500 mg IV q48h. 3.  Will check vancomycin level with am labs Friday 3/1.   Raziah Funnell L. Amada Jupiter, PharmD, [place] Clinical Pharmacist Pager: [phone] 09/13/2011 10:03 AM

## 2011-09-13 NOTE — Progress Notes (Signed)
Patient ID: [name], female   DOB: Sep 14, 1945, 66 y.o.   MRN: [id number] S:intubated but awake and alert O:BP 142/76  Pulse 88  Temp(Src) 98.7 F (37.1 C) (Oral)  Resp 18  Ht 5\' 7"  (1.702 m)  Wt 124.7 kg (274 lb 14.6 oz)  BMI 43.06 kg/m2  SpO2 100%  Intake/Output Summary (Last 24 hours) at 09/13/11 0904 Last data filed at 09/13/11 0800  Gross per 24 hour  Intake  461.6 ml  Output   2220 ml  Net -1758.4 ml   Weight change: 1.9 kg (4 lb 3 oz) [id number] AAF intubated in NAD CVS:RRR Resp:scattered rhonchi [id number] Ext:3+edema on right leg, 1+ on left   Lab 09/13/11 0450 09/12/11 1830 09/12/11 0915 09/11/11 0825 09/10/11 2000  NA 140 139 142 137 137  K 3.3* 3.5 5.3* 4.0 4.1  CL 105 104 109 105 102  CO2 21 22 21 24 26   GLUCOSE 99 77 94 90 97  BUN 46* 42* 62* 56* 56*  CREATININE 5.04* 4.64* 5.85* 5.32* 5.16*  ALB -- -- -- -- --  CALCIUM 10.9* 11.0* 11.8* 11.1*11.9* 12.7*  PHOS 2.7 -- 4.8* -- --  AST -- -- -- -- 24  ALT -- -- -- -- 9   Liver Function Tests:  Lab 09/12/11 0915 09/10/11 2000  AST -- 24  ALT -- 9  ALKPHOS -- 103  BILITOT -- 0.2*  PROT -- 5.5*  ALBUMIN 1.6* 1.8*   No results found for this basename: LIPASE:3,AMYLASE:3 in the last 168 hours No results found for this basename: AMMONIA:3 in the last 168 hours CBC:  Lab 09/13/11 0450 09/12/11 1830 09/10/11 2000  WBC 13.9* 13.4* 13.4*  NEUTROABS -- -- 8.8*  HGB 7.5* 7.7* 8.3*  HCT 22.5* 23.6* 25.6*  MCV 84.0 85.8 86.8  PLT 290 283 423*   Cardiac Enzymes: No results found for this basename: CKTOTAL:5,CKMB:5,CKMBINDEX:5,TROPONINI:5 in the last 168 hours CBG:  Lab 09/13/11 0817 09/13/11 0422 09/12/11 2351 09/12/11 2006 09/12/11 1859  GLUCAP 112* 94 81 73 81    Iron Studies:  Basename 09/11/11 0210  IRON 35*  TIBC 122*  TRANSFERRIN --  FERRITIN 218   Studies/Results: Ct Head Wo Contrast  09/12/2011  *RADIOLOGY REPORT*  Clinical Data: 66 year old female with decreased level of  consciousness.  CT HEAD WITHOUT CONTRAST  Technique:  Contiguous axial images were obtained from the base of the skull through the vertex without contrast.  Comparison: 09/10/2011 and earlier.  Findings: Mild bubbly opacity in the right sphenoid.  Other Visualized paranasal sinuses and mastoids are clear.  The patient is intubated.  No acute scalp or orbit soft tissue findings. No acute osseous abnormality identified.  Stable cerebral volume.  No ventriculomegaly. Stable gray-white matter differentiation throughout the brain.  No midline shift, mass effect, or evidence of mass lesion.  No acute intracranial hemorrhage identified.  No evidence of cortically based acute infarction identified.  No suspicious intracranial vascular hyperdensity.  IMPRESSION: 1.  Stable noncontrast CT appearance the brain without acute intracranial abnormality. 2.  Mild paranasal sinus inflammatory changes in the setting of intubation.  Original Report Authenticated By: Randall An, M.D.   US Abdomen Complete  09/11/2011  *RADIOLOGY REPORT*  Clinical Data:  Elevated INR, liver disease, morbid obesity, prior cholecystectomy  ULTRASOUND ABDOMEN:  Technique:  Sonography of upper abdominal structures was performed. Examination limited by body habitus and bowel gas.  Comparison:  Renal ultrasound 09/03/2006  Gallbladder:  Surgically absent  Common bile duct:  8 mm diameter, may be normal post cholecystectomy  Liver:  Normal appearance  IVC:  Normal appearance  Pancreas:  Suboptimally visualized due to bowel gas, with portions of head and tail obscured.  Visualized portions normal appearance.  Spleen:  Normal appearance, 5.9 cm length  Right kidney:  10.8 cm length.  Increased cortical echogenicity. Small cyst 1.1 x 1.2 x 1.0 cm.  No gross hydronephrosis or shadowing calcification.  Left kidney:  Suboptimally visualized.  Approximately 10.7 cm length.  Marked cortical thinning.  Increased cortical echogenicity.  No gross mass or  hydronephrosis seen on limited assessment.  Aorta:  Proximally normal caliber, remainder obscured by bowel gas.  Other:  No free fluid  IMPRESSION: Medical renal disease changes of both kidneys with cortical atrophy of left kidney. Post cholecystectomy with slightly dilated common bile duct 8 mm diameter, which may be physiologic; recommend correlation with LFTs. Incomplete visualization of pancreas, aorta and left kidney.  Original Report Authenticated By: Burnetta Sabin, M.D.   Dg Chest Port 1 View  09/13/2011  *RADIOLOGY REPORT*  Clinical Data: Endotracheal tube.  PORTABLE CHEST - 1 VIEW  Comparison: 09/12/2011.  Findings: Endotracheal tube is in satisfactory position. Nasogastric tube is followed into the stomach, with the tip projecting beyond the inferior boundary of the film.  Right IJ central line and right PICC tips project over the SVC.  Heart is at the upper limits of normal in size, stable.  Left perihilar and right infrahilar air space disease persists.  Question left pleural effusion.  IMPRESSION: Left perihilar and right basilar airspace disease persist. Probable left pleural effusion.  Original Report Authenticated By: Luretha Rued, M.D.   Dg Chest Port 1 View  09/12/2011  *RADIOLOGY REPORT*  Clinical Data: Respiratory failure and status post intubation.  PORTABLE CHEST - 1 VIEW  Comparison: Film at 1030 hours.  Findings: Endotracheal tube present with the tip approximately 2.5 cm above the carina.  Stable positioning of temporary dialysis catheter with the tip in the lower SVC.  Additional right-sided PICC line tip continues to lie at the level of the proximal SVC. Nasogastric tube continues to extend into the stomach.  Lungs show stable cardiomegaly and left lower lobe atelectasis.  No overt edema present.  No evidence of pneumothorax.  No significant pleural fluid.  IMPRESSION: Endotracheal tube appropriately positioned with tip 2.5 cm above the carina.  Otherwise no significant change  in appearance of the chest.  Original Report Authenticated By: Azzie Roup, M.D.   Dg Chest Port 1 View  09/12/2011  *RADIOLOGY REPORT*  Clinical Data: Line placement.  PORTABLE CHEST - 1 VIEW  Comparison: 09/12/2011  Findings: Right central line placement.  The tip is in the SVC.  No pneumothorax.  Right PICC line is unchanged.  Cardiomegaly.  Mild vascular congestion. No effusions.  Probable left basilar opacity, unchanged.  No overt edema.  IMPRESSION: Right central line tip in the SVC.  No pneumothorax.  Otherwise no change.  Original Report Authenticated By: Raelyn Number, M.D.   Dg Chest Port 1 View  09/12/2011  *RADIOLOGY REPORT*  Clinical Data: Edema, renal failure  PORTABLE CHEST - 1 VIEW  Comparison: 09/10/2011  Findings: Cardiomegaly again noted.  No pulmonary edema.  Stable right arm PICC line position with tip in upper SVC.  Stable left basilar atelectasis or infiltrate.  IMPRESSION: No pulmonary edema.  Again noted left basilar atelectasis or infiltrate.  Original Report Authenticated By: Lahoma Crocker, M.D.      .  albuterol-ipratropium  6 puff Inhalation Q4H  . antiseptic oral rinse  1 application Mouth Rinse QID  . calcitonin  480 Units Subcutaneous BID  . chlorhexidine  15 mL Mouth/Throat BID  . diltiazem  60 mg Oral Q6H  . etomidate      . fentaNYL      . furosemide      . furosemide  80 mg Intravenous Q6H  . insulin aspart  0-3 Units Subcutaneous Q4H  . levofloxacin (LEVAQUIN) IV  500 mg Intravenous Q48H  . metoprolol tartrate  50 mg Oral BID  . midazolam      . pantoprazole (PROTONIX) IV  40 mg Intravenous Q24H  . potassium chloride  10 mEq Intravenous Q1 Hr x 2  . predniSONE  30 mg Per Tube Q breakfast  . sodium chloride      . sotalol  80 mg Oral Q48H  . vecuronium      . DISCONTD: allopurinol  100 mg Oral Daily  . DISCONTD: ceFEPime (MAXIPIME) IV  1 g Intravenous Q24H  . DISCONTD: diltiazem  300 mg Oral QAC breakfast  . DISCONTD: docusate sodium  100 mg Oral  BID  . DISCONTD: enoxaparin (LOVENOX) injection  30 mg Subcutaneous Daily  . DISCONTD: metoprolol succinate  100 mg Oral QAC breakfast  . DISCONTD: metoprolol succinate  50 mg Oral BID  . DISCONTD: saccharomyces boulardii  250 mg Oral BID  . DISCONTD: sotalol  80 mg Oral BID    BMET    Component Value Date/Time   NA 140 09/13/2011 0450   K 3.3* 09/13/2011 0450   CL 105 09/13/2011 0450   CO2 21 09/13/2011 0450   GLUCOSE 99 09/13/2011 0450   BUN 46* 09/13/2011 0450   CREATININE 5.04* 09/13/2011 0450   CALCIUM 10.9* 09/13/2011 0450   CALCIUM 11.1* 09/11/2011 0825   GFRNONAA 8* 09/13/2011 0450   GFRAA 10* 09/13/2011 0450   CBC    Component Value Date/Time   WBC 13.9* 09/13/2011 0450   RBC 2.68* 09/13/2011 0450   HGB 7.5* 09/13/2011 0450   HCT 22.5* 09/13/2011 0450   PLT 290 09/13/2011 0450   MCV 84.0 09/13/2011 0450   MCH 28.0 09/13/2011 0450   MCHC 33.3 09/13/2011 0450   RDW 16.6* 09/13/2011 0450   LYMPHSABS 3.1 09/10/2011 2000   MONOABS 0.9 09/10/2011 2000   EOSABS 0.5 09/10/2011 2000   BASOSABS 0.1 09/10/2011 2000     Assessment/Plan: 1. Acute on CRF (oliguric), unclear cause. UA shows mild microhemautria, prot 100. DDx: hypercalcemia, vanco toxicity, post-infectious GN, immune-complex GN due to indolent infection (hip), AIN due to meds, ATN, etc.. MS better overnight. S/P acute HD yesterday.  2. Hypercalcemia- low iPTH, PTH-related peptide pending.  ?due to immobility vs. Paraneoplastic vs iatrogenic 3. 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 HTN  5. Hx IC bleed  6. Afib on sotalol, BB  7. CKD baseline creat 2.3, was on 80-160 po lasix bid at home  8. Gout on allopurinol, reduced dose is appropriate  9. Probable PNA, LLL- on IV abx per primary  10. Volume- improved edema LE's post HD with UF of 1.7, d/c IVF's and give IV lasix today  11. VDRF  per [place]

## 2011-09-13 NOTE — Progress Notes (Signed)
Spoke with Dr. Alvan Dame in regards to seeing pt. Today. States he will call pt's husband to discuss pt situation. Will round on pt tomorrow 3/1. Gave MD pt's husband's contact information, MD will follow up with husband. Possibility for pt to return to OR Saturday.

## 2011-09-13 NOTE — Progress Notes (Deleted)
Cancelled MRI of the head due to decreased suspicion for encephalopathy as cause of altered mental status since patient's mentation is improved this morning, and she is fully neurologically intact.   Right hip, however, is foul-smelling, and we are concerned about recurrent infection.  Discussed with Dr. Alvan Dame ([facility]) who recently performed her right hip arthroplasty, which was complicated by Pseudomonas infection. He has requested we defer imaging of hip until he comes and sees her today.  We appreciate his consultation.

## 2011-09-13 NOTE — Progress Notes (Signed)
*  PRELIMINARY RESULTS* Echocardiogram 2D Echocardiogram has been performed.  Roxine Caddy [facility] 09/13/2011, 12:23 PM

## 2011-09-13 NOTE — Progress Notes (Signed)
Pt woke up very agitated, swinging her arms wildly in bed, Propofol gtt restarted at 10 mcgs. Pt now calmer.

## 2011-09-13 NOTE — Progress Notes (Signed)
CCM progress note Cancelled MRI of the head due to decreased suspicion for encephalopathy as cause of altered mental status since patient's mentation is improved this morning, and she is fully neurologically intact.   Right hip, however, is foul-smelling, and we are concerned about recurrent infection.  Discussed with Dr. Alvan Dame ([facility]) who recently performed her right hip arthroplasty, which was complicated by Pseudomonas infection. He has requested we defer imaging of hip until he comes and sees her today.  We appreciate his consultation.    Samuel Germany. Verdie Drown, MD PGY-2

## 2011-09-14 ENCOUNTER — Inpatient Hospital Stay (HOSPITAL_COMMUNITY): Payer: Medicare Other

## 2011-09-14 DIAGNOSIS — N186 End stage renal disease: Secondary | ICD-10-CM | POA: Insufficient documentation

## 2011-09-14 DIAGNOSIS — N185 Chronic kidney disease, stage 5: Secondary | ICD-10-CM | POA: Insufficient documentation

## 2011-09-14 DIAGNOSIS — Z992 Dependence on renal dialysis: Secondary | ICD-10-CM

## 2011-09-14 DIAGNOSIS — I120 Hypertensive chronic kidney disease with stage 5 chronic kidney disease or end stage renal disease: Secondary | ICD-10-CM | POA: Insufficient documentation

## 2011-09-14 HISTORY — DX: Dependence on renal dialysis: N18.6

## 2011-09-14 LAB — GLUCOSE, CAPILLARY
Glucose-Capillary: 100 mg/dL — ABNORMAL HIGH (ref 70–99)
Glucose-Capillary: 104 mg/dL — ABNORMAL HIGH (ref 70–99)
Glucose-Capillary: 108 mg/dL — ABNORMAL HIGH (ref 70–99)
Glucose-Capillary: 108 mg/dL — ABNORMAL HIGH (ref 70–99)
Glucose-Capillary: 89 mg/dL (ref 70–99)
Glucose-Capillary: 95 mg/dL (ref 70–99)

## 2011-09-14 LAB — KAPPA/LAMBDA LIGHT CHAINS
Kappa free light chain: 16.9 mg/dL — ABNORMAL HIGH (ref 0.33–1.94)
Kappa, lambda light chain ratio: 1.14 (ref 0.26–1.65)
Lambda free light chains: 14.8 mg/dL — ABNORMAL HIGH (ref 0.57–2.63)

## 2011-09-14 LAB — CULTURE, RESPIRATORY W GRAM STAIN: Gram Stain: NONE SEEN

## 2011-09-14 LAB — RENAL FUNCTION PANEL
Albumin: 1.7 g/dL — ABNORMAL LOW (ref 3.5–5.2)
BUN: 54 mg/dL — ABNORMAL HIGH (ref 6–23)
CO2: 21 mEq/L (ref 19–32)
Calcium: 10.2 mg/dL (ref 8.4–10.5)
Chloride: 106 mEq/L (ref 96–112)
Creatinine, Ser: 5.86 mg/dL — ABNORMAL HIGH (ref 0.50–1.10)
GFR calc Af Amer: 8 mL/min — ABNORMAL LOW (ref >90)
GFR calc non Af Amer: 7 mL/min — ABNORMAL LOW (ref >90)
Glucose, Bld: 97 mg/dL (ref 70–99)
Phosphorus: 3 mg/dL (ref 2.3–4.6)
Potassium: 3.6 mEq/L (ref 3.5–5.1)
Sodium: 140 mEq/L (ref 135–145)

## 2011-09-14 LAB — BLOOD GAS, ARTERIAL
Acid-base deficit: 1.8 mmol/L (ref 0.0–2.0)
Bicarbonate: 21 mEq/L (ref 20.0–24.0)
Drawn by: 35849
FIO2: 0.4 %
MECHVT: 600 mL
O2 Saturation: 98.8 %
PEEP: 5 cmH2O
Patient temperature: 98.6
RATE: 16 resp/min
TCO2: 21.8 mmol/L (ref 0–100)
pCO2 arterial: 27.4 mmHg — ABNORMAL LOW (ref 35.0–45.0)
pH, Arterial: 7.497 — ABNORMAL HIGH (ref 7.350–7.400)
pO2, Arterial: 175 mmHg — ABNORMAL HIGH (ref 80.0–100.0)

## 2011-09-14 LAB — VITAMIN D 1,25 DIHYDROXY
Vitamin D 1, 25 (OH)2 Total: <8 pg/mL — ABNORMAL LOW (ref 18–72)
Vitamin D2 1, 25 (OH)2: <8 pg/mL
Vitamin D3 1, 25 (OH)2: <8 pg/mL

## 2011-09-14 LAB — PROTIME-INR
INR: 1.09 (ref 0.00–1.49)
Prothrombin Time: 14.3 seconds (ref 11.6–15.2)

## 2011-09-14 LAB — CBC
HCT: 21.7 % — ABNORMAL LOW (ref 36.0–46.0)
Hemoglobin: 7.1 g/dL — ABNORMAL LOW (ref 12.0–15.0)
MCH: 27.8 pg (ref 26.0–34.0)
MCHC: 32.7 g/dL (ref 30.0–36.0)
MCV: 85.1 fL (ref 78.0–100.0)
Platelets: 299 10*3/uL (ref 150–400)
RBC: 2.55 MIL/uL — ABNORMAL LOW (ref 3.87–5.11)
RDW: 16.9 % — ABNORMAL HIGH (ref 11.5–15.5)
WBC: 13.4 10*3/uL — ABNORMAL HIGH (ref 4.0–10.5)

## 2011-09-14 LAB — ANTI-DNA ANTIBODY, DOUBLE-STRANDED: ds DNA Ab: 21 IU/mL (ref <30)

## 2011-09-14 LAB — MPO/PR-3 (ANCA) ANTIBODIES
Myeloperoxidase Abs: 18 AU/mL (ref <20)
Serine Protease 3: <1 AU/mL (ref <20)

## 2011-09-14 LAB — ANA: Anti Nuclear Antibody(ANA): POSITIVE — AB

## 2011-09-14 LAB — ANTISTREPTOLYSIN O TITER: ASO: 26 IU/mL (ref 0–408)

## 2011-09-14 LAB — VANCOMYCIN, RANDOM: Vancomycin Rm: 28.7 ug/mL

## 2011-09-14 LAB — ANTI-NUCLEAR AB-TITER (ANA TITER): ANA Titer 1: 1:80 {titer} — ABNORMAL HIGH

## 2011-09-14 LAB — C4 COMPLEMENT: Complement C4, Body Fluid: 17 mg/dL (ref 10–40)

## 2011-09-14 LAB — PREPARE RBC (CROSSMATCH)

## 2011-09-14 LAB — GLOMERULAR BASEMENT MEMBRANE ANTIBODIES: GBM Ab: <1 AU/mL (ref <20)

## 2011-09-14 LAB — CULTURE, RESPIRATORY

## 2011-09-14 LAB — C3 COMPLEMENT: C3 Complement: 112 mg/dL (ref 90–180)

## 2011-09-14 IMAGING — CR DG CHEST 1V PORT
1 series · 1 of 1 positions shown · non-contrast
Comparison: 1 day prior

CLINICAL DATA: Follow up of infiltrate.  Shortness of breath.
Morbid obesity.

PORTABLE CHEST - 1 VIEW

[AP]
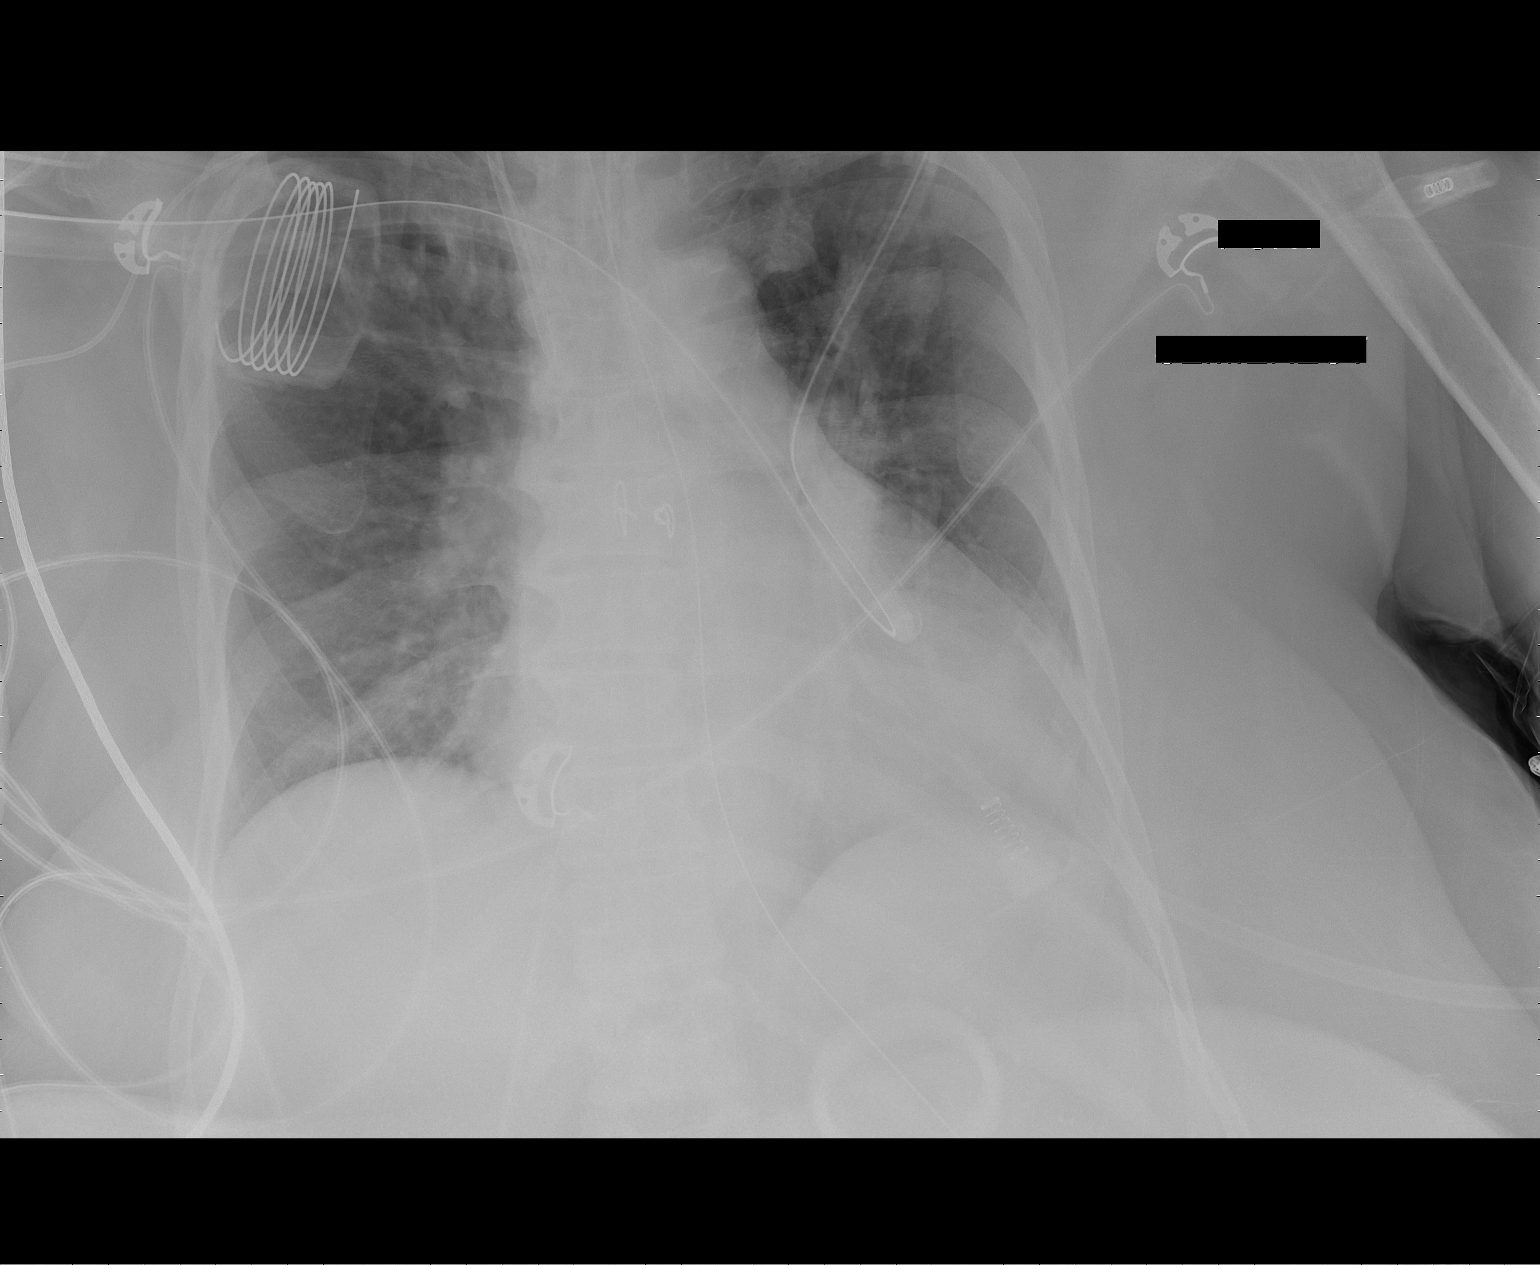

[1 of 1 positions shown; findings below may reference images not displayed]

FINDINGS: Endotracheal tube is unchanged, with tip at the level of
the clavicles.  Nasogastric tube extends beyond the inferior aspect
of the film.  Right IJ central line unchanged.

Mild cardiomegaly.  No right pleural effusion.  Cannot exclude
small left pleural effusion. No pneumothorax.  Low lung volumes.
Interval development of mild pulmonary venous congestion.  Mild
bibasilar volume loss.
IMPRESSION: Cardiomegaly and low lung volumes with mild pulmonary venous
congestion.

Similar patchy bibasilar atelectasis.

Possible small left pleural effusion.

## 2011-09-14 MED ORDER — SODIUM CHLORIDE 0.9 % IV SOLN
INTRAVENOUS | Status: DC
  Administered 2011-09-14 – 2011-09-26 (×2): via INTRAVENOUS
  Administered 2011-09-29: 500 mL via INTRAVENOUS

## 2011-09-14 MED ORDER — HEPARIN SODIUM (PORCINE) 1000 UNIT/ML DIALYSIS
20.0000 [IU]/kg | INTRAMUSCULAR | Status: DC | PRN
  Administered 2011-09-18 – 2011-09-24 (×2): 2500 [IU] via INTRAVENOUS_CENTRAL
  Filled 2011-09-14: qty 3

## 2011-09-14 MED ORDER — CHLORHEXIDINE GLUCONATE 0.12 % MT SOLN
15.0000 mL | Freq: Two times a day (BID) | OROMUCOSAL | Status: DC
  Administered 2011-09-14 – 2011-09-17 (×5): 15 mL via OROMUCOSAL
  Filled 2011-09-14 (×4): qty 15

## 2011-09-14 MED ORDER — BIOTENE DRY MOUTH MT LIQD
15.0000 mL | Freq: Four times a day (QID) | OROMUCOSAL | Status: DC
  Administered 2011-09-14 – 2011-09-17 (×12): 15 mL via OROMUCOSAL

## 2011-09-14 MED ORDER — SODIUM CHLORIDE 0.45 % IV SOLN: INTRAVENOUS | Status: DC

## 2011-09-14 NOTE — Progress Notes (Signed)
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  She underwent I & D on 2/18 and discharge on 2/21 on Xarelto.  Presented to ED on 2/25 with confusion, slurred speech from nursing facility.  Known to have CKD with baseline creatinine of 2.3 (as of 2/210.  Initial cr of 5.1 on admit with AMS, hypercalcemia.  CT head was negative on 2/25.  2/27 afternoon was noted to have significantly altered mental status by [facility] after HD (1.2 L removed) and PCCM consulted.    Lines / Drains: 2/27 OETT>>> 2/27 OGT>>> ? PICC line>>> Rt ij HD 2/28>>>  Cultures: 2/25 Urine>>>negative 2/25 Blood>>>NGTD 2/27 Sputum >>  Antibiotics: 2/25 Maxipime>>> 2/26 Rocephin>> 2/26 2/25 Levaquin>>> 2/28 vanc>>>  Tests / Events: 2/25 CT HEAD>>>Negative for bleed or other acute intracranial process 2/27 Head CT >>>Stable noncontrast CT appearance the brain without acute intracranial abnormality. 2/28 Ortho called 3/1 remains awake alert, poor output  Overnight Events:  OBJECTIVE Vital Signs: Filed Vitals:   09/14/11 0800 09/14/11 0815 09/14/11 0841 09/14/11 1044  BP: 143/76   126/72  Pulse: 79 80  82  Temp: 98.2 F (36.8 C)     TempSrc: Axillary     Resp: 16 16    Height:   5\' 7"  (1.702 m)   Weight:      SpO2: 100% 100%      I/O last 3 completed shifts: In: 1238.2 [I.V.:926.2; NG/GT:20; IV Piggyback:292] Out: 1210 [Urine:910; Emesis/NG output:300]  Physical Examination: General:  Chronically ill in no acute distress Neuro:  Awake, appropriate,. follows commands well HEENT:  PERRL, line clean Cardiovascular:  RRR, no M/R/G Lungs:  Bilateral, some rhonchi  Abdomen:  Soft, nontender, nondistended, bowel sounds present Musculoskeletal:  Moves all extremities,  no pedal edema  Ventilator settings: Vent Mode:  [-] PRVC FiO2 (%):  [30 %-40 %] 40 % Set Rate:  [6 bmp-16 bmp] 6 bmp Vt Set:  [600 mL] 600 mL PEEP:  [5 cmH20] 5 cmH20 Pressure Support:  [12 cmH20] 12 cmH20 Plateau Pressure:  [16 cmH20-20 cmH20] 16 cmH20  Labs and Imaging:  Reviewed.  Please refer to the Assessment and Plan section for relevant results.  ASSESSMENT AND PLAN  NEUROLOGIC A:  Encephalopathy, multifactorial (uremia, hypercalcemia, Gabapentin / Hydrocodone, sepsis). R/o infectious etiology,  Worsening face asymmetry.  History of CVA (hemorrhagic).  Negative head CT on 2/25. P: -->  Repeat head CT showing no evolving stroke or new bleed as cause of AMS --< MRI brain held as woke up WNL, good exam --> use int fentanyl, a drip off now -->  WUA daily ---> poor clinical h/o for seizures, hold off eeg --> LP no role, fully awake now ---> treat sepsis See ABX changes If needed, may consider precedex  PULMONARY  Lab 09/14/11 0434 09/13/11 0634 09/12/11 1938 09/12/11 0922  PHART 7.497* 7.461* 7.499* 7.258*  PCO2ART 27.4* 30.4* 31.1* 48.1*  PO2ART 175.0* 162.0* 453.0* 68.1*  HCO3 21.0 21.7 24.3* 20.8  O2SAT 98.8 100.0 100.0 93.6   A:  Intubated for airway protection.  Combined metabolic and respiratory acidosis.  History of Asthma and OSA on CPAP.  Possible aspiration pneumonia / pneumonitis as food particles aspirated by ETT Sx. P: --> abg reviewed,drop  TV , rate to 10 --> weaning this aM CPAP 5 PS 5 AS GOAL, HAs SOME APNEA, REDUCE ALK ON ABG --> pcxr no defined infiltrate, repeat in am  --> if no surgical intervantion planned, would consider extubation in pm  After HD --> MS improved --> pcxr repeat in am  After extubationin future will need NIMV home CPAP  CARDIOVASCULAR  Lab 09/12/11 1830  TROPONINI --  LATICACIDVEN 1.1  PROBNP --   A:  Hemodynamically stable.  No evidence of ischemia.  History of HTN and cardiomyopathy (last known EF 2010 55%).  History of  atrial fibrillation, now in sinus rhythm. P: -->  Sotalol, Metoprolol and Cardizem (per preadmission)  Echo assess veg - pending  RENAL  Lab 09/14/11 0500 09/13/11 0450 09/12/11 1830 09/12/11 0915 09/11/11 0825  NA 140 139140 139 142 137  K 3.6 3.2*3.3* -- -- --  CL 106 104105 104 109 105  CO2 21 2221 22 21 24   BUN 54* 48*46* 42* 62* 56*  CREATININE 5.86* 5.16*5.04* 4.64* 5.85* 5.32*  CALCIUM 10.2 11.0*10.9* 11.0* 11.8* 11.1*11.9*  MG -- 2.0 -- -- --  PHOS 3.0 2.72.7 -- 4.8* --   A:  Acute on chronic renal failure.  Hypercalcemia (corrected Ca 12.9).  Hyperphosphatemia. P: -->  HD per Renal  to repeat hd now , pre extubation  GASTROINTESTINAL  Lab 09/14/11 0500 09/13/11 0450 09/12/11 0915 09/10/11 2000  AST -- -- -- 24  ALT -- -- -- 9  ALKPHOS -- -- -- 103  BILITOT -- -- -- 0.2*  PROT -- -- -- 5.5*  ALBUMIN 1.7* 1.7* 1.6* 1.8*   A:  No active issues. P: -->  Monitor -->  NPO Start TF if not extubated, \ Having some N./V frmo ETT  HEMATOLOGIC  Lab 09/14/11 0500 09/13/11 0450 09/12/11 1830 09/11/11 0825 09/10/11 2000  HGB 7.1* 7.5* 7.7* -- 8.3*  HCT 21.7* 22.5* 23.6* -- 25.6*  PLT 299 290 283 -- 423*  INR -- -- -- 1.61* 1.68*  APTT -- -- -- -- 45*   A:  Anemia, etiology is not clear.  No overt hemorrhage.  On Xarelto prior to admission. P: -->  CBC in AM -->  Hold Xarelto --> last inr 1.6 --> holding anticoag until d/w ortho?   INFECTIOUS  Lab 09/14/11 0500 09/13/11 0450 09/12/11 1830 09/10/11 2000  WBC 13.4* 13.9* 13.4* 13.4*  PROCALCITON -- -- 0.21 --   A:  Infected R hip prosthesis, s/p removal and ABX spacer placement on 2/18.  Suspected aspiration pneumonia. P: --> continue ceftaz, levo, vanc Prior pseudo Calling ortho to discuss now - update: plan OR ID in am , keep vented  ENDOCRINE  Lab 09/14/11 0744 09/14/11 0434 09/13/11 2358 09/13/11 2000 09/13/11 1544  GLUCAP 104* 95 100* 105* 120*   A:  No history of DM.  Normal TSH this  admission. P: -->  SSI --> no hypotension  BEST PRACTICE / DISPOSITION -->  ICU status under PCCM -->  Full code -->  NPO- OR 3/2 -->  Lovenox off  -->  Protonix IV for GI Px -->  Ventilator bundle -->  Family updated at bedside  Ccm 35 min   Christon Gallaway J. 09/14/2011, 11:22 AM   Lavon Paganini. Titus Mould, MD, [place] Pgr: [facility]

## 2011-09-14 NOTE — Progress Notes (Signed)
ANTIBIOTIC CONSULT NOTE - FOLLOW UP  Pharmacy Consult for Vancomycin Indication: Suspected PNA  Allergies  Allergen Reactions  . Coumadin Other (See Comments)    Caused her to have a stroke    Patient Measurements: Height: 5\' 7"  (170.2 cm) Weight: 273 lb 13 oz (124.2 kg) IBW/kg (Calculated) : 61.6   Vital Signs: Temp: 98.5 F (36.9 C) (03/01 1203) Temp src: Oral (03/01 1203) BP: 125/84 mmHg (03/01 1532) Pulse Rate: 96  (03/01 1532) Intake/Output from previous day: 02/28 0701 - 03/01 0700 In: 796.6 [I.V.:592.6; NG/GT:20; IV Piggyback:184] Out: 830 [Urine:530; Emesis/NG output:300] Intake/Output from this shift: Total I/O In: 70.2 [I.V.:32.2; NG/GT:30; IV Piggyback:8] Out: 102 [Urine:102]  Labs:  [facility] 09/14/11 0500 09/13/11 0450 09/12/11 1830  WBC 13.4* 13.9* 13.4*  HGB 7.1* 7.5* 7.7*  PLT 299 290 283  LABCREA -- -- --  CREATININE 5.86* 5.16*5.04* 4.64*   Estimated Creatinine Clearance: 13.4 ml/min (by C-G formula based on Cr of 5.86).  Basename 09/14/11 1000 09/12/11 1509  VANCOTROUGH -- --  VANCOPEAK -- --  VANCORANDOM 28.7 31.8  GENTTROUGH -- --  GENTPEAK -- --  GENTRANDOM -- --  TOBRATROUGH -- --  TOBRAPEAK -- --  TOBRARND -- --  AMIKACINPEAK -- --  AMIKACINTROU -- --  AMIKACIN -- --     Microbiology: Recent Results (from the past 720 hour(s))  SURGICAL PCR SCREEN     Status: Abnormal   Collection Time   08/28/11 10:33 AM      Component Value Range Status Comment   MRSA, PCR NEGATIVE  NEGATIVE  Final    Staphylococcus aureus POSITIVE (*) NEGATIVE  Final   GRAM STAIN     Status: Normal   Collection Time   09/03/11  8:08 AM      Component Value Range Status Comment   Specimen Description FLUID SYNOVIAL RIGHT HIP ON SWAB   Final    Special Requests NONE   Final    Gram Stain     Final    Value: ABUNDANT WBC PRESENT, PREDOMINANTLY PMN     NO ORGANISMS SEEN     Gram Stain Report Called to,Read Back By and Verified With:     DAVENPORT,S. RN AT  [id number] ON 09/03/11 BY GILLESPIE,B.   Report Status 09/03/2011 FINAL   Final   BODY FLUID CULTURE     Status: Normal   Collection Time   09/03/11  8:09 AM      Component Value Range Status Comment   Specimen Description FLUID SYNOVIAL RIGHT HIP ON SWAB   Final    Special Requests NONE   Final    Gram Stain     Final    Value: ABUNDANT WBC PRESENT, PREDOMINANTLY PMN     NO ORGANISMS SEEN     Gram Stain Report Called to,Read Back By and Verified With: Gram Stain Report Called to,Read Back By and Verified With: Carolanne Grumbling RN @ [phone] ON 09/03/11 BY Ethel Rana B Performed by [facility]   Culture     Final    Value: RARE PSEUDOMONAS AERUGINOSA     Note: CRITICAL RESULT CALLED TO, READ BACK BY AND VERIFIED WITH: KIMBERLY OWENS   Report Status 09/06/2011 FINAL   Final    Organism ID, Bacteria PSEUDOMONAS AERUGINOSA   Final   ANAEROBIC CULTURE     Status: Normal   Collection Time   09/03/11  8:10 AM      Component Value Range Status Comment   Specimen Description FLUID  SYNOVIAL RIGHT HIP   Final    Special Requests NONE   Final    Gram Stain     Final    Value: NO WBC SEEN     NO SQUAMOUS EPITHELIAL CELLS SEEN     NO ORGANISMS SEEN   Culture NO ANAEROBES ISOLATED   Final    Report Status 09/07/2011 FINAL   Final   CULTURE, BLOOD (ROUTINE X 2)     Status: Normal (Preliminary result)   Collection Time   09/10/11  7:55 PM      Component Value Range Status Comment   Specimen Description BLOOD   Final    Special Requests BOTTLES DRAWN AEROBIC AND ANAEROBIC 3CC   Final    Culture  Setup Time [id number]   Final    Culture     Final    Value:        BLOOD CULTURE RECEIVED NO GROWTH TO DATE CULTURE WILL BE HELD FOR 5 DAYS BEFORE ISSUING A FINAL NEGATIVE REPORT   Report Status PENDING   Incomplete   CULTURE, BLOOD (ROUTINE X 2)     Status: Normal (Preliminary result)   Collection Time   09/10/11  8:00 PM      Component Value Range Status Comment   Specimen Description BLOOD LEFT ARM    Final    Special Requests BOTTLES DRAWN AEROBIC AND ANAEROBIC 4CC   Final    Culture  Setup Time [id number]   Final    Culture     Final    Value:        BLOOD CULTURE RECEIVED NO GROWTH TO DATE CULTURE WILL BE HELD FOR 5 DAYS BEFORE ISSUING A FINAL NEGATIVE REPORT   Report Status PENDING   Incomplete   URINE CULTURE     Status: Normal   Collection Time   09/10/11 10:53 PM      Component Value Range Status Comment   Specimen Description URINE, CATHETERIZED   Final    Special Requests NONE   Final    Culture  Setup Time [id number]   Final    Colony Count NO GROWTH   Final    Culture NO GROWTH   Final    Report Status 09/12/2011 FINAL   Final   MRSA PCR SCREENING     Status: Normal   Collection Time   09/11/11 12:48 AM      Component Value Range Status Comment   MRSA by PCR NEGATIVE  NEGATIVE  Final   CULTURE, RESPIRATORY     Status: Normal   Collection Time   09/12/11  6:53 PM      Component Value Range Status Comment   Specimen Description TRACHEAL ASPIRATE   Final    Special Requests NONE   Final    Gram Stain     Final    Value: NO WBC SEEN     MODERATE SQUAMOUS EPITHELIAL CELLS PRESENT     RARE GRAM POSITIVE COCCI IN PAIRS   Culture Non-Pathogenic Oropharyngeal-type Flora Isolated.   Final    Report Status 09/14/2011 FINAL   Final     Anti-infectives     Start     Dose/Rate Route Frequency Ordered Stop   09/13/11 1100   cefTAZidime (FORTAZ) 2 g in dextrose 5 % 50 mL IVPB        2 g 100 mL/hr over 30 Minutes Intravenous Every 48 hours 09/13/11 1016     09/12/11 2200   levofloxacin (LEVAQUIN) IVPB  500 mg        500 mg 100 mL/hr over 60 Minutes Intravenous Every 48 hours 09/11/11 0042     09/12/11 0200   ceFEPIme (MAXIPIME) 1 g in dextrose 5 % 50 mL IVPB  Status:  Discontinued        1 g 100 mL/hr over 30 Minutes Intravenous Every 24 hours 09/11/11 1003 09/13/11 0851   09/11/11 0045   cefTRIAXone (ROCEPHIN) 1 g in dextrose 5 % 50 mL IVPB  Status:  Discontinued          1 g 100 mL/hr over 30 Minutes Intravenous Daily at bedtime 09/11/11 0017 09/11/11 1003   09/10/11 2300   vancomycin (VANCOCIN) 2,000 mg in sodium chloride 0.9 % 500 mL IVPB        2,000 mg 250 mL/hr over 120 Minutes Intravenous  Once 09/10/11 2200 09/11/11 0513   09/10/11 2145   ceFEPIme (MAXIPIME) 1 g in dextrose 5 % 50 mL IVPB        1 g 100 mL/hr over 30 Minutes Intravenous  Once 09/10/11 2138 09/11/11 0241   09/10/11 2145   Levofloxacin (LEVAQUIN) IVPB 750 mg        750 mg 100 mL/hr over 90 Minutes Intravenous  Once 09/10/11 2138 09/10/11 2323          Assessment: 72 YOF on Day #4 Vancomycin/Levaquin, Day #2 fortaz (changed from cefepime for increased BBB penetration) for empiric HCAP, h/o pan-S pseudomonas hip-infection. Patient has acute on chronic renal failure. Making little urine 355cc/24h, 0.1cc/kg/h. Scr 5.86, baseline ~2.3). Pt is HD-naive, had 1st session on 2/27. Had a 2nd HD session today x3 hours at BFR 200.  Pt had a load vancomycin dose of 2 grams on 2/26 at 0313. Post HD vanc level on 2/27 was supratherapeutic (31.8). Pre-HD vanc today was close to goal (28.7). Would estimate post-HD level ~23.3, which is still within goal range. No further plans for HD noted at this time.  Goal of Therapy:  pre-HD vanc level 15-25 mcg/mL on HD Appropriate antibiotic dosing.  Plan:  1.  No post-HD vancomycin dose today. Will closely follow for any further HD plans and recheck vanc level as clinically indicated. 2.  Continue Fortaz 2g IV q48h. Will follow renal function and adjust dose as necessary. 3.  Continue Levaquin 500 mg IV q48h. Will follow renal function and adjust dose as necessary.

## 2011-09-14 NOTE — Progress Notes (Signed)
Patient ID: [name], female   DOB: [birth year], 66 y.o.   MRN: [id number] S:pt intubated/agitated O:BP 143/76  Pulse 80  Temp(Src) 98.2 F (36.8 C) (Axillary)  Resp 16  Ht 5\' 7"  (1.702 m)  Wt 122.8 kg (270 lb 11.6 oz)  BMI 42.40 kg/m2  SpO2 100%  Intake/Output Summary (Last 24 hours) at 09/14/11 0842 Last data filed at 09/14/11 0800  Gross per 24 hour  Intake  808.8 ml  Output    800 ml  Net    8.8 ml   Weight change: -1.9 kg (-4 lb 3 oz) Gen:WD obese female, intubated/agitated CVS:RRR Resp:occ rhonchi [id number] Ext:3+edema on right leg, trace-1 on left   Lab 09/14/11 0500 09/13/11 0450 09/12/11 1830 09/12/11 0915 09/11/11 0825 09/10/11 2000  NA 140 139140 139 142 137 137  K 3.6 3.2*3.3* 3.5 5.3* 4.0 4.1  CL 106 104105 104 109 105 102  CO2 21 2221 22 21 24 26   GLUCOSE 97 9899 77 94 90 97  BUN 54* 48*46* 42* 62* 56* 56*  CREATININE 5.86* 5.16*5.04* 4.64* 5.85* 5.32* 5.16*  ALB -- -- -- -- -- --  CALCIUM 10.2 11.0*10.9* 11.0* 11.8* 11.1*11.9* 12.7*  PHOS 3.0 2.72.7 -- 4.8* -- --  AST -- -- -- -- -- 24  ALT -- -- -- -- -- 9   Liver Function Tests:  Lab 09/14/11 0500 09/13/11 0450 09/12/11 0915 09/10/11 2000  AST -- -- -- 24  ALT -- -- -- 9  ALKPHOS -- -- -- 103  BILITOT -- -- -- 0.2*  PROT -- -- -- 5.5*  ALBUMIN 1.7* 1.7* 1.6* --   No results found for this basename: LIPASE:3,AMYLASE:3 in the last 168 hours No results found for this basename: AMMONIA:3 in the last 168 hours CBC:  Lab 09/14/11 0500 09/13/11 0450 09/12/11 1830 09/10/11 2000  WBC 13.4* 13.9* 13.4* --  NEUTROABS -- -- -- 8.8*  HGB 7.1* 7.5* 7.7* --  HCT 21.7* 22.5* 23.6* --  MCV 85.1 84.0 85.8 86.8  PLT 299 290 283 --   Cardiac Enzymes: No results found for this basename: CKTOTAL:5,CKMB:5,CKMBINDEX:5,TROPONINI:5 in the last 168 hours CBG:  Lab 09/14/11 0744 09/14/11 0434 09/13/11 2358 09/13/11 2000 09/13/11 1544  GLUCAP 104* 95 100* 105* 120*    Iron Studies: No results  found for this basename: IRON,TIBC,TRANSFERRIN,FERRITIN in the last 72 hours Studies/Results: Ct Head Wo Contrast  09/12/2011  *RADIOLOGY REPORT*  Clinical Data: 66 year old female with decreased level of consciousness.  CT HEAD WITHOUT CONTRAST  Technique:  Contiguous axial images were obtained from the base of the skull through the vertex without contrast.  Comparison: 09/10/2011 and earlier.  Findings: Mild bubbly opacity in the right sphenoid.  Other Visualized paranasal sinuses and mastoids are clear.  The patient is intubated.  No acute scalp or orbit soft tissue findings. No acute osseous abnormality identified.  Stable cerebral volume.  No ventriculomegaly. Stable gray-white matter differentiation throughout the brain.  No midline shift, mass effect, or evidence of mass lesion.  No acute intracranial hemorrhage identified.  No evidence of cortically based acute infarction identified.  No suspicious intracranial vascular hyperdensity.  IMPRESSION: 1.  Stable noncontrast CT appearance the brain without acute intracranial abnormality. 2.  Mild paranasal sinus inflammatory changes in the setting of intubation.  Original Report Authenticated By: Randall An, M.D.   Dg Chest Port 1 View  09/14/2011  *RADIOLOGY REPORT*  Clinical Data: Follow up of infiltrate.  Shortness of breath. Morbid obesity.  PORTABLE CHEST - 1 VIEW  Comparison: 1 day prior  Findings: Endotracheal tube is unchanged, with tip at the level of the clavicles.  Nasogastric tube extends beyond the inferior aspect of the film.  Right IJ central line unchanged.  Mild cardiomegaly.  No right pleural effusion.  Cannot exclude small left pleural effusion. No pneumothorax.  Low lung volumes. Interval development of mild pulmonary venous congestion.  Mild bibasilar volume loss.  IMPRESSION: Cardiomegaly and low lung volumes with mild pulmonary venous congestion.  Similar patchy bibasilar atelectasis.  Possible small left pleural effusion.  Original  Report Authenticated By: Areta Haber, M.D.   Dg Chest Port 1 View  09/13/2011  *RADIOLOGY REPORT*  Clinical Data: Endotracheal tube.  PORTABLE CHEST - 1 VIEW  Comparison: 09/12/2011.  Findings: Endotracheal tube is in satisfactory position. Nasogastric tube is followed into the stomach, with the tip projecting beyond the inferior boundary of the film.  Right IJ central line and right PICC tips project over the SVC.  Heart is at the upper limits of normal in size, stable.  Left perihilar and right infrahilar air space disease persists.  Question left pleural effusion.  IMPRESSION: Left perihilar and right basilar airspace disease persist. Probable left pleural effusion.  Original Report Authenticated By: Luretha Rued, M.D.   Dg Chest Port 1 View  09/12/2011  *RADIOLOGY REPORT*  Clinical Data: Respiratory failure and status post intubation.  PORTABLE CHEST - 1 VIEW  Comparison: Film at 1030 hours.  Findings: Endotracheal tube present with the tip approximately 2.5 cm above the carina.  Stable positioning of temporary dialysis catheter with the tip in the lower SVC.  Additional right-sided PICC line tip continues to lie at the level of the proximal SVC. Nasogastric tube continues to extend into the stomach.  Lungs show stable cardiomegaly and left lower lobe atelectasis.  No overt edema present.  No evidence of pneumothorax.  No significant pleural fluid.  IMPRESSION: Endotracheal tube appropriately positioned with tip 2.5 cm above the carina.  Otherwise no significant change in appearance of the chest.  Original Report Authenticated By: Azzie Roup, M.D.   Dg Chest Port 1 View  09/12/2011  *RADIOLOGY REPORT*  Clinical Data: Line placement.  PORTABLE CHEST - 1 VIEW  Comparison: 09/12/2011  Findings: Right central line placement.  The tip is in the SVC.  No pneumothorax.  Right PICC line is unchanged.  Cardiomegaly.  Mild vascular congestion. No effusions.  Probable left basilar opacity,  unchanged.  No overt edema.  IMPRESSION: Right central line tip in the SVC.  No pneumothorax.  Otherwise no change.  Original Report Authenticated By: Raelyn Number, M.D.   Dg Chest Port 1 View  09/12/2011  *RADIOLOGY REPORT*  Clinical Data: Edema, renal failure  PORTABLE CHEST - 1 VIEW  Comparison: 09/10/2011  Findings: Cardiomegaly again noted.  No pulmonary edema.  Stable right arm PICC line position with tip in upper SVC.  Stable left basilar atelectasis or infiltrate.  IMPRESSION: No pulmonary edema.  Again noted left basilar atelectasis or infiltrate.  Original Report Authenticated By: Lahoma Crocker, M.D.      . albuterol-ipratropium  6 puff Inhalation Q4H  . antiseptic oral rinse  15 mL Mouth Rinse QID  . calcitonin  480 Units Subcutaneous BID  . cefTAZidime (FORTAZ)  IV  2 g Intravenous Q48H  . chlorhexidine  15 mL Mouth Rinse BID  . diltiazem  60 mg Oral Q6H  . furosemide  80 mg Intravenous Q6H  .  insulin aspart  0-3 Units Subcutaneous Q4H  . levofloxacin (LEVAQUIN) IV  500 mg Intravenous Q48H  . metoprolol tartrate  50 mg Oral BID  . pantoprazole (PROTONIX) IV  40 mg Intravenous Q24H  . potassium chloride  10 mEq Intravenous Q1 Hr x 2  . potassium chloride      . predniSONE  30 mg Per Tube Q breakfast  . sodium chloride      . sotalol  80 mg Oral Q48H  . DISCONTD: antiseptic oral rinse  1 application Mouth Rinse QID  . DISCONTD: ceFEPime (MAXIPIME) IV  1 g Intravenous Q24H  . DISCONTD: chlorhexidine  15 mL Mouth/Throat BID  . DISCONTD: enoxaparin (LOVENOX) injection  30 mg Subcutaneous Daily  . DISCONTD: potassium chloride  10 mEq Intravenous Q1 Hr x 2    BMET    Component Value Date/Time   NA 140 09/14/2011 0500   K 3.6 09/14/2011 0500   CL 106 09/14/2011 0500   CO2 21 09/14/2011 0500   GLUCOSE 97 09/14/2011 0500   BUN 54* 09/14/2011 0500   CREATININE 5.86* 09/14/2011 0500   CALCIUM 10.2 09/14/2011 0500   CALCIUM 11.1* 09/11/2011 0825   GFRNONAA 7* 09/14/2011 0500   GFRAA 8* 09/14/2011  0500   CBC    Component Value Date/Time   WBC 13.4* 09/14/2011 0500   RBC 2.55* 09/14/2011 0500   HGB 7.1* 09/14/2011 0500   HCT 21.7* 09/14/2011 0500   PLT 299 09/14/2011 0500   MCV 85.1 09/14/2011 0500   MCH 27.8 09/14/2011 0500   MCHC 32.7 09/14/2011 0500   RDW 16.9* 09/14/2011 0500   LYMPHSABS 3.1 09/10/2011 2000   MONOABS 0.9 09/10/2011 2000   EOSABS 0.5 09/10/2011 2000   BASOSABS 0.1 09/10/2011 2000     Assessment/Plan: 1. Acute on CRF (oliguric), unclear cause. UA shows mild microhemautria, prot 100. DDx: hypercalcemia, vanco toxicity, post-infectious GN, immune-complex GN due to indolent infection (hip), AIN due to meds, ATN, etc.. MS worse overnight.  Decreased UOP, plan for another round of HD today and follow  2. Hypercalcemia- low iPTH, PTH-related peptide pending. ?due to immobility vs. Paraneoplastic vs iatrogenic.  Improved with HD 3. 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 HTN  5. Hx IC bleed  6. Afib on sotalol, BB  7. CKD baseline creat 2.3, was on 80-160 po lasix bid at home  8. Gout on allopurinol, reduced dose is appropriate  9. Probable PNA, LLL- on IV abx per primary  10. Volume- improved edema LE's post HD with UF of 1.7, d/c IVF's and give IV lasix today  11. VDRF per PCCM 12. Dispo-per primary svc  Rishith Siddoway A

## 2011-09-14 NOTE — Progress Notes (Signed)
[facility] Progress Note Patient Name: [name] DOB: 1945-12-04 MRN: [id number]  Date of Service  09/14/2011   HPI/Events of Note   hgb 7.1 g/dL preop  eICU Interventions   2U PRBCs      Eryn Marandola 09/14/2011, 10:08 PM

## 2011-09-14 NOTE — Progress Notes (Signed)
CSW reviewed chart and staffed pt in morning rounds. Will continue to follow to provide support to pt and family and facilitate d/c planning, as appropriate to pt progression.   Jerral Ralph, MSW [phone]

## 2011-09-14 NOTE — Progress Notes (Signed)
Patient ID: [name], female   DOB: 01-14-1946, 67 y.o.   MRN: [id number] Subjective:   [name] is well known to me from complications arising from an initially uncomplicated index primary THR.  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  She recently underwent complex resection of an infected nonunion and removal of all implants with placement of antibiotic spacer.  Admitted for altered mental status related to current condition, decreased renal function requiring intubation.  Currently remains intubated and currently sedated.  Lengthy discussion with family regarding current situation and plans    Objective:   VITALS:   Filed Vitals:   09/14/11 2100  BP: 150/90  Pulse: 89  Temp:   Resp: 13   PE:  Intubated and sedated, family in room.  I had previously evaluated wound with serous ooze from wound consistent more from anticoagulation than any thing else.  ICU team concerned about foul smell.  LABS  Basename 09/14/11 0500 09/13/11 0450 09/12/11 1830  HGB 7.1* 7.5* 7.7*  HCT 21.7* 22.5* 23.6*  WBC 13.4* 13.9* 13.4*  PLT 299 290 283     Basename 09/14/11 0500 09/13/11 0450 09/12/11 1830  NA 140 139140 139  K 3.6 3.2*3.3* 3.5  BUN 54* 48*46* 42*  CREATININE 5.86* 5.16*5.04* 4.64*  GLUCOSE 97 9899 77    No results found for this basename: LABPT:2,INR:2 in the last 72 hours   Assessment/Plan:   Procedure(s) (LRB): IRRIGATION AND DEBRIDEMENT EXTREMITY (Right)  After discussion with medical team and family I plan to take her to the OR tomorrow for a repeat exploratory I&D of the right hip with placement of antibiotic beads to try and prevent additional systemic toxicity but at the same time trying our best to treat her infected hip.  Very complicated case at this point with multiple medical co-morbidities.   NPO, consent, type and cross transfuse 2 units PRBCs preoperatively due to low Hgb  and poor renal function and expected perioperative blood loss anemia

## 2011-09-15 ENCOUNTER — Encounter (HOSPITAL_COMMUNITY)
Admission: EM | Disposition: A | Discharge status: Skilled Nursing Facility | Payer: Self-pay | Source: Home / Self Care | Attending: Family Medicine

## 2011-09-15 ENCOUNTER — Inpatient Hospital Stay (HOSPITAL_COMMUNITY): Payer: Medicare Other | Admitting: Anesthesiology

## 2011-09-15 ENCOUNTER — Inpatient Hospital Stay (HOSPITAL_COMMUNITY): Payer: Medicare Other

## 2011-09-15 ENCOUNTER — Encounter (HOSPITAL_COMMUNITY): Payer: Self-pay | Admitting: Anesthesiology

## 2011-09-15 HISTORY — PX: I&D EXTREMITY: SHX5045

## 2011-09-15 LAB — PROTIME-INR
INR: 1.13 (ref 0.00–1.49)
Prothrombin Time: 14.7 seconds (ref 11.6–15.2)

## 2011-09-15 LAB — GLUCOSE, CAPILLARY
Glucose-Capillary: 101 mg/dL — ABNORMAL HIGH (ref 70–99)
Glucose-Capillary: 87 mg/dL (ref 70–99)
Glucose-Capillary: 92 mg/dL (ref 70–99)
Glucose-Capillary: 92 mg/dL (ref 70–99)
Glucose-Capillary: 93 mg/dL (ref 70–99)

## 2011-09-15 LAB — CBC
HCT: 27.6 % — ABNORMAL LOW (ref 36.0–46.0)
Hemoglobin: 9.2 g/dL — ABNORMAL LOW (ref 12.0–15.0)
MCH: 28.8 pg (ref 26.0–34.0)
MCHC: 33.3 g/dL (ref 30.0–36.0)
MCV: 86.3 fL (ref 78.0–100.0)
Platelets: 178 10*3/uL (ref 150–400)
RBC: 3.2 MIL/uL — ABNORMAL LOW (ref 3.87–5.11)
RDW: 16.8 % — ABNORMAL HIGH (ref 11.5–15.5)
WBC: 14.6 10*3/uL — ABNORMAL HIGH (ref 4.0–10.5)

## 2011-09-15 LAB — APTT: aPTT: 34 seconds (ref 24–37)

## 2011-09-15 LAB — TRIGLYCERIDES: Triglycerides: 157 mg/dL — ABNORMAL HIGH (ref <150)

## 2011-09-15 LAB — RENAL FUNCTION PANEL
Albumin: 1.9 g/dL — ABNORMAL LOW (ref 3.5–5.2)
BUN: 40 mg/dL — ABNORMAL HIGH (ref 6–23)
CO2: 25 mEq/L (ref 19–32)
Calcium: 9.2 mg/dL (ref 8.4–10.5)
Chloride: 104 mEq/L (ref 96–112)
Creatinine, Ser: 4.83 mg/dL — ABNORMAL HIGH (ref 0.50–1.10)
GFR calc Af Amer: 10 mL/min — ABNORMAL LOW (ref >90)
GFR calc non Af Amer: 9 mL/min — ABNORMAL LOW (ref >90)
Glucose, Bld: 95 mg/dL (ref 70–99)
Phosphorus: 4.2 mg/dL (ref 2.3–4.6)
Potassium: 3.9 mEq/L (ref 3.5–5.1)
Sodium: 142 mEq/L (ref 135–145)

## 2011-09-15 LAB — COMPLEMENT, TOTAL: Compl, Total (CH50): 52 U/mL (ref 31–60)

## 2011-09-15 IMAGING — CR DG CHEST 1V PORT
1 series · 1 of 1 positions shown · non-contrast
Comparison: the previous day's study

CLINICAL DATA: Intubated

PORTABLE CHEST - 1 VIEW

[view not recorded]
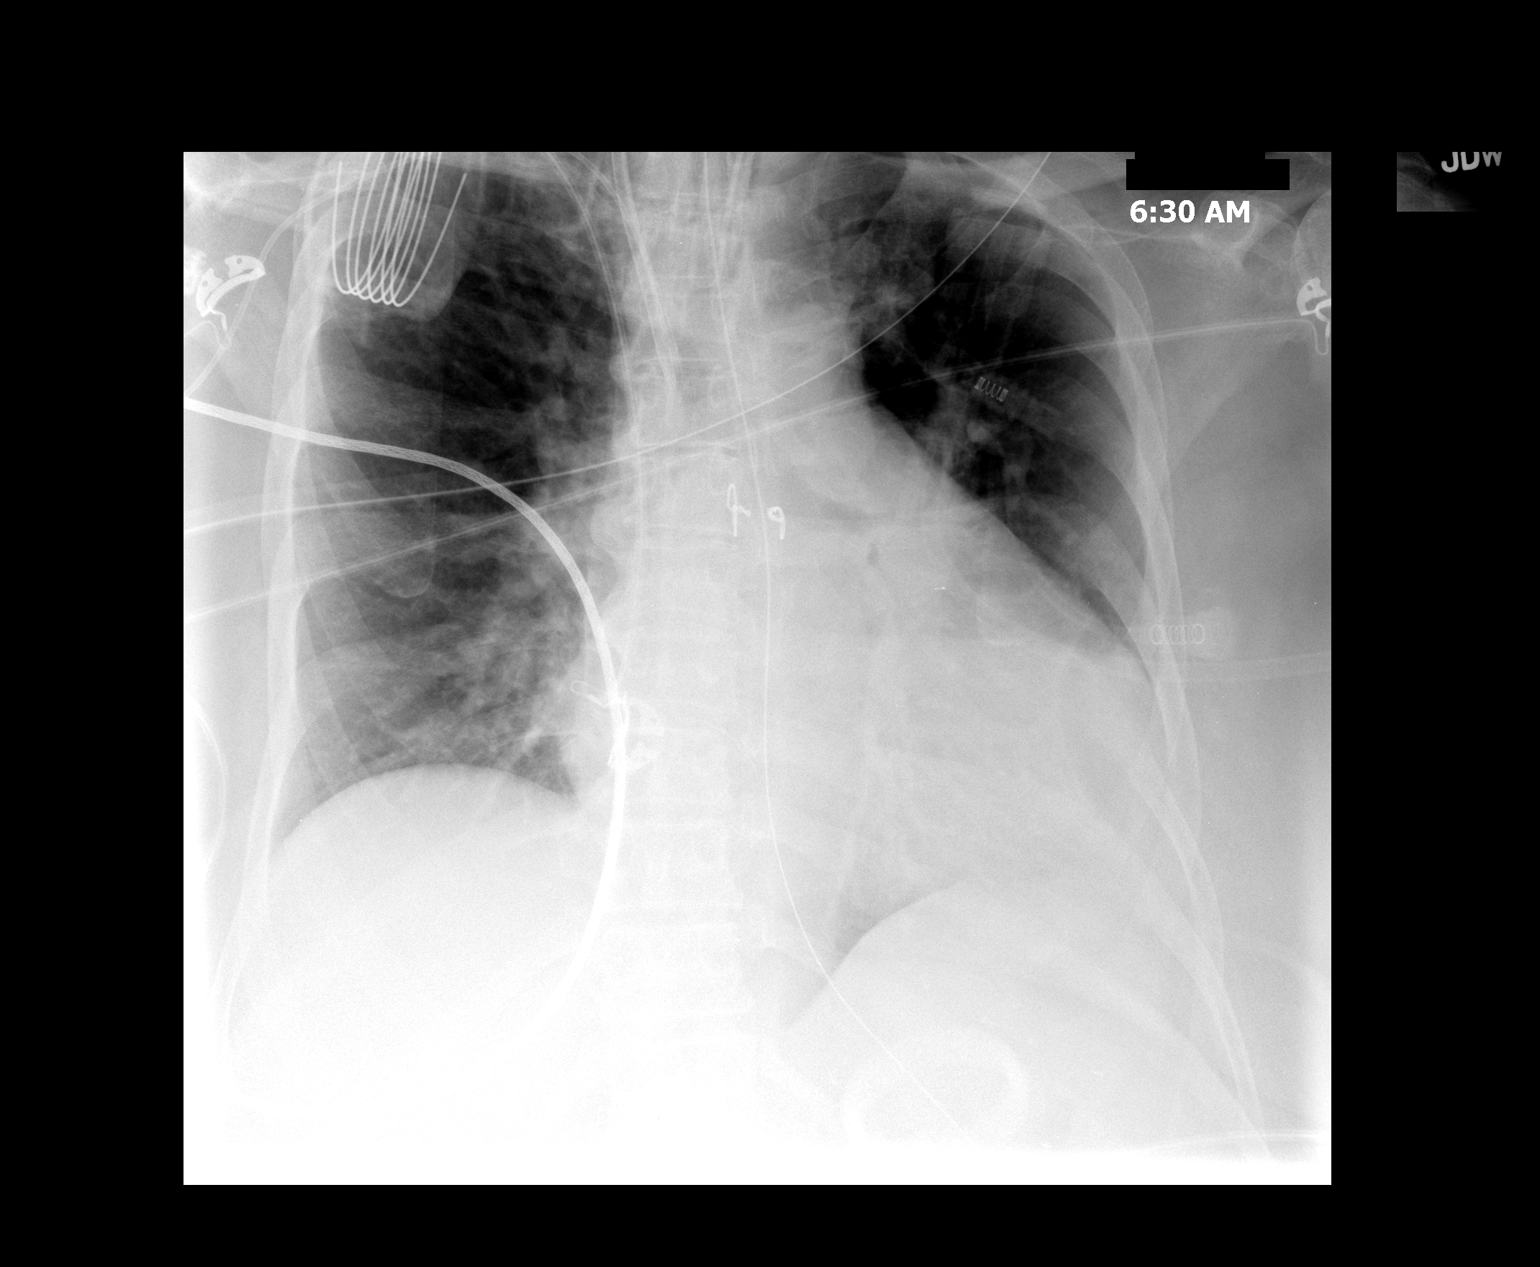

[1 of 1 positions shown; findings below may reference images not displayed]

FINDINGS: Endotracheal tube, nasogastric tube, and right IJ central
line are stable in position.  Stable cardiomegaly.  Relatively low
lung volumes with persistent perihilar and bibasilar atelectasis or
interstitial infiltrates.  Cannot exclude small left pleural
effusion.
IMPRESSION: 1.  Little change from previous day's portable exam.

## 2011-09-15 SURGERY — IRRIGATION AND DEBRIDEMENT EXTREMITY
Anesthesia: General | Site: Hip | Laterality: Right | Wound class: Dirty or Infected

## 2011-09-15 MED ORDER — TOBRAMYCIN SULFATE 1.2 G IJ SOLR
1.2000 g | INTRAMUSCULAR | Status: DC
  Filled 2011-09-15: qty 1.2

## 2011-09-15 MED ORDER — ONDANSETRON HCL 4 MG/2ML IJ SOLN
INTRAMUSCULAR | Status: DC | PRN
  Administered 2011-09-15: 4 mg via INTRAVENOUS

## 2011-09-15 MED ORDER — MIDAZOLAM HCL 5 MG/5ML IJ SOLN
INTRAMUSCULAR | Status: DC | PRN
  Administered 2011-09-15: 2 mg via INTRAVENOUS

## 2011-09-15 MED ORDER — FENTANYL CITRATE 0.05 MG/ML IJ SOLN
INTRAMUSCULAR | Status: DC | PRN
  Administered 2011-09-15 (×2): 100 ug via INTRAVENOUS
  Administered 2011-09-15: 150 ug via INTRAVENOUS

## 2011-09-15 MED ORDER — PREDNISONE 20 MG PO TABS
30.0000 mg | ORAL_TABLET | Freq: Every day | ORAL | Status: DC
  Filled 2011-09-15: qty 1

## 2011-09-15 MED ORDER — ROCURONIUM BROMIDE 100 MG/10ML IV SOLN
INTRAVENOUS | Status: DC | PRN
  Administered 2011-09-15: 30 mg via INTRAVENOUS
  Administered 2011-09-15: 20 mg via INTRAVENOUS

## 2011-09-15 MED ORDER — PANTOPRAZOLE SODIUM 40 MG PO PACK
40.0000 mg | PACK | Freq: Every day | ORAL | Status: DC
  Filled 2011-09-15: qty 20

## 2011-09-15 MED ORDER — LORAZEPAM 2 MG/ML IJ SOLN: 1.0000 mg | INTRAMUSCULAR | Status: DC | PRN

## 2011-09-15 MED ORDER — PREDNISONE 20 MG PO TABS
30.0000 mg | ORAL_TABLET | Freq: Every day | ORAL | Status: DC
  Administered 2011-09-16 – 2011-09-17 (×2): 30 mg via ORAL
  Filled 2011-09-15 (×3): qty 1

## 2011-09-15 MED ORDER — VANCOMYCIN HCL 1000 MG IV SOLR
2000.0000 mg | INTRAVENOUS | Status: DC
  Filled 2011-09-15: qty 2000

## 2011-09-15 MED ORDER — SODIUM CHLORIDE 0.9 % IR SOLN
Status: DC | PRN
  Administered 2011-09-15: 6000 mL

## 2011-09-15 MED ORDER — SODIUM CHLORIDE 0.9 % IV SOLN
INTRAVENOUS | Status: DC | PRN
  Administered 2011-09-15 (×2): via INTRAVENOUS

## 2011-09-15 MED ORDER — TOBRAMYCIN SULFATE 1.2 G IJ SOLR
INTRAMUSCULAR | Status: DC | PRN
  Administered 2011-09-15: 2.4 g

## 2011-09-15 MED ORDER — VANCOMYCIN HCL 1000 MG IV SOLR
INTRAVENOUS | Status: DC | PRN
  Administered 2011-09-15: 2 g

## 2011-09-15 SURGICAL SUPPLY — 58 items
BANDAGE ELASTIC 4 VELCRO ST LF (GAUZE/BANDAGES/DRESSINGS) IMPLANT
BANDAGE GAUZE ELAST BULKY 4 IN (GAUZE/BANDAGES/DRESSINGS) IMPLANT
CANISTER WOUND CARE 500ML ATS (WOUND CARE) ×2 IMPLANT
CLOTH BEACON ORANGE TIMEOUT ST (SAFETY) ×2 IMPLANT
CUFF TOURNIQUET SINGLE 18IN (TOURNIQUET CUFF) IMPLANT
CUFF TOURNIQUET SINGLE 24IN (TOURNIQUET CUFF) IMPLANT
CUFF TOURNIQUET SINGLE 34IN LL (TOURNIQUET CUFF) IMPLANT
CUFF TOURNIQUET SINGLE 44IN (TOURNIQUET CUFF) IMPLANT
DERMABOND ADHESIVE PROPEN (GAUZE/BANDAGES/DRESSINGS) ×1
DERMABOND ADVANCED .7 DNX6 (GAUZE/BANDAGES/DRESSINGS) ×1 IMPLANT
DRAPE ORTHO SPLIT 77X108 STRL (DRAPES) ×1
DRAPE SURG 17X23 STRL (DRAPES) IMPLANT
DRAPE SURG ORHT 6 SPLT 77X108 (DRAPES) ×1 IMPLANT
DRAPE U-SHAPE 47X51 STRL (DRAPES) ×4 IMPLANT
DRSG EMULSION OIL 3X3 NADH (GAUZE/BANDAGES/DRESSINGS) IMPLANT
DRSG PAD ABDOMINAL 8X10 ST (GAUZE/BANDAGES/DRESSINGS) IMPLANT
DRSG VAC ATS SM SENSATRAC (GAUZE/BANDAGES/DRESSINGS) ×2 IMPLANT
ELECT REM PT RETURN 9FT ADLT (ELECTROSURGICAL) ×2
ELECTRODE REM PT RTRN 9FT ADLT (ELECTROSURGICAL) ×1 IMPLANT
FACESHIELD LNG OPTICON STERILE (SAFETY) IMPLANT
GAUZE SPONGE 4X4 12PLY STRL LF (GAUZE/BANDAGES/DRESSINGS) ×2 IMPLANT
GAUZE XEROFORM 5X9 LF (GAUZE/BANDAGES/DRESSINGS) ×2 IMPLANT
GLOVE BIOGEL PI IND STRL 7.5 (GLOVE) ×1 IMPLANT
GLOVE BIOGEL PI IND STRL 8 (GLOVE) ×1 IMPLANT
GLOVE BIOGEL PI INDICATOR 7.5 (GLOVE) ×1
GLOVE BIOGEL PI INDICATOR 8 (GLOVE) ×1
GLOVE ORTHO TXT STRL SZ7.5 (GLOVE) ×4 IMPLANT
GLOVE SURG ORTHO 8.0 STRL STRW (GLOVE) ×4 IMPLANT
GOWN STRL NON-REIN LRG LVL3 (GOWN DISPOSABLE) ×6 IMPLANT
HANDPIECE INTERPULSE COAX TIP (DISPOSABLE) ×1
IV NS IRRIG 3000ML ARTHROMATIC (IV SOLUTION) ×4 IMPLANT
KIT BASIN OR (CUSTOM PROCEDURE TRAY) ×2 IMPLANT
KIT ROOM TURNOVER OR (KITS) ×2 IMPLANT
KIT STIMULAN RAPID CURE  10CC (Orthopedic Implant) ×2 IMPLANT
KIT STIMULAN RAPID CURE 10CC (Orthopedic Implant) ×2 IMPLANT
MANIFOLD NEPTUNE II (INSTRUMENTS) ×2 IMPLANT
NS IRRIG 1000ML POUR BTL (IV SOLUTION) ×2 IMPLANT
PACK ORTHO EXTREMITY (CUSTOM PROCEDURE TRAY) ×2 IMPLANT
PACK UNIVERSAL I (CUSTOM PROCEDURE TRAY) ×2 IMPLANT
PAD ARMBOARD 7.5X6 YLW CONV (MISCELLANEOUS) ×4 IMPLANT
SET HNDPC FAN SPRY TIP SCT (DISPOSABLE) ×1 IMPLANT
SPONGE GAUZE 4X4 12PLY (GAUZE/BANDAGES/DRESSINGS) ×2 IMPLANT
SPONGE LAP 18X18 X RAY DECT (DISPOSABLE) ×6 IMPLANT
SPONGE LAP 4X18 X RAY DECT (DISPOSABLE) IMPLANT
STOCKINETTE IMPERVIOUS 9X36 MD (GAUZE/BANDAGES/DRESSINGS) ×2 IMPLANT
SUCTION FRAZIER TIP 10 FR DISP (SUCTIONS) IMPLANT
SUT ETHILON 3 0 PS 1 (SUTURE) IMPLANT
SUT SILK 2 0 FS (SUTURE) IMPLANT
SUT VIC AB 2-0 CT1 27 (SUTURE) ×1
SUT VIC AB 2-0 CT1 TAPERPNT 27 (SUTURE) ×1 IMPLANT
TAPE CLOTH SURG 4X10 WHT LF (GAUZE/BANDAGES/DRESSINGS) ×2 IMPLANT
TOWEL OR 17X24 6PK STRL BLUE (TOWEL DISPOSABLE) ×2 IMPLANT
TOWEL OR 17X26 10 PK STRL BLUE (TOWEL DISPOSABLE) ×2 IMPLANT
TUBE ANAEROBIC SPECIMEN COL (MISCELLANEOUS) IMPLANT
TUBE CONNECTING 12X1/4 (SUCTIONS) ×2 IMPLANT
UNDERPAD 30X30 INCONTINENT (UNDERPADS AND DIAPERS) IMPLANT
WATER STERILE IRR 1000ML POUR (IV SOLUTION) IMPLANT
YANKAUER SUCT BULB TIP NO VENT (SUCTIONS) ×2 IMPLANT

## 2011-09-15 NOTE — Op Note (Signed)
[name], [name]                 ACCOUNT NO.:  [id number]  MEDICAL RECORD NO.:  [id number]  LOCATION:  2110                         FACILITY:  [facility]  PHYSICIAN:  Pietro Cassis. Alvan Dame, M.D.  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  POSTOPERATIVE DIAGNOSES: 1. 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 2. Right thigh seroma, no signs of active purulence or infection.  PROCEDURE: 1. Re-incisional debridement of the right hip including skin and     subcutaneous tissue, followed by irrigation with 6 L of normal     saline solution.  The patient has been noted before and noted again     today to have an extensive subcutaneous dead space between her     subcutaneous tissue and her muscle fascia. 2. Placement of a small wound vacuum-assisted closure (dressings) in     the mid portion of her wound to allow for suction of seroma fluid     collection. 3. Placement of antibiotic-laden Stimulan beads, each 10 mL of beads     was mixed with 1 g of tobramycin and 1 g of vancomycin.  SURGEON:  Pietro Cassis. Alvan Dame, MD  ASSISTANT:  Danae Orleans, PAC.  Note that Mr. Guinevere Scarlet was present for the entirety of the case and had been helpful from and utilized from preoperative positioning to perioperative management of soft tissues  and primary wound closure.  ANESTHESIA:  General as the patient was intubated in the intensive care unit, transferred directly over to the operating room.  SPECIMENS:  No specimens were taken at this point as she is on currently 3 different antibiotics as well as the general appearance of her wound.  DRAINS:  As noted from the small wound VAC.  INDICATIONS FOR PROCEDURE:  Ms. Tomasik is a 66 year old patient of mine with a history of an index right total hip replacement that initially had been doing very well but was complicated by a fall and that subsequently led to an attempted open reduction and internal fixation. This was complicated further by a repeat fall and a fracture beneath even at that level.  Subsequent open reduction and internal fixation required extensive surgical dissection including the use of a cable plate and allograft.  She unfortunately subsequently developed persistent wound problems and drainage with seroma collection in her subcutaneous tissue.  As a result, this most likely led to her having infected nonunion with fracture of her hardware requiring resection of  her implant as well as all deep implants.  This was done approximately 2 weeks ago.  She had been transferred to a nursing facility but began having some mental status changes and subsequently brought and admitted to the hospital.  She has had a multi-system complicating features requiring intubation, hemodialysis recently for significant drop in her renal function.  We were consulted to evaluate her wound as she is a patient of mine for consistent wound drainage.  After reviewing with her husband, the concerns that were outlined by the intensive care unit but also the fact they were trying to wean her from the ventilator, I felt that it was appropriate to go ahead and repeat I and D of her hip, get a chance to have a 2nd look to make certain things are healing well and not having a problem but  also I will be able to treat persistent drainage.  Consent was obtained after this discussion, as well as previous discussions with her before.  PROCEDURE IN DETAIL:  The patient was brought to the operative theater. Once they had adequately transitioned her from the intensive care to the operative room setting, she was position on the operative table and then positioned in the left lateral decubitus position with the right side up.  Following pre-scrubbing and cleaning of the right hip area, we prepped and draped the right hip in a sterile fashion.  A time-out was performed identifying the patient, planned procedure, and extremity.  I chose at this point to really open up only the proximal portion, proximal third to half of her incision.  I excised the skin and subcutaneous tissue down.  There was noted to be at this point an evacuation of about 1 L seroma.  There was no active purulence in this, just appeared to be an old seroma here.  Once this seroma was evacuated, I was able to further open up this incision again by debriding skin and subcutaneous tissue.  I kept the distal half of the incision intact.  Once the initial debridement was carried out, the hip was irrigated with 6 L of normal saline solution pulse lavage.  Once I was satisfied, this was fairly well maintained, some hemostasis was obtained in the subcutaneous layers.  I then reapproximated the subcu layer, but chose at this point to place a small wound VAC in the mid portion of the wound to allow for an attempt to decompress this dead space to allow for possibility of healing by secondary intent.  The wound VAC was placed, and sponge applied with good suction.  The rest of the wound was dressed with the distal portion.  After removing staples, I went ahead and placed some Dermabond over this.  The proximal portion was closed with staples on the skin and then dressed with Xeroform sponges and some tape.  She was  then brought to the recovery room.  The patient was then brought back to the intensive care unit, intubated for further management and care.     Pietro Cassis Alvan Dame, M.D.     MDO/MEDQ  D:  09/15/2011  T:  09/15/2011  Job:  [id number]

## 2011-09-15 NOTE — Brief Op Note (Signed)
09/10/2011 - 09/15/2011  11:39 AM  PATIENT:  [name]  66 y.o. 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 Right thigh seroma  PROCEDURE:  Procedure(s) (LRB): IRRIGATION AND DEBRIDEMENT right thigh with placement of a wound vac  SURGEON:  Surgeon(s) and Role:    * [name], MD - Primary  PHYSICIAN ASSISTANT: Danae Orleans, PA-C   ANESTHESIA:   general  EBL:  Total I/O In: 608 [I.V.:570; NG/GT:30; IV Piggyback:8] Out: 335 [Urine:285; Blood:50]  BLOOD ADMINISTERED:none  DRAINS: small wound vac in mid portion of wound    LOCAL MEDICATIONS USED:  NONE  SPECIMEN:  No Specimen  DISPOSITION OF SPECIMEN:  N/A  COUNTS:  YES  TOURNIQUET:  * No tourniquets in log *  DICTATION: .Other Dictation: Dictation Number [id number]  PLAN OF CARE: Admit to inpatient   PATIENT DISPOSITION:  ICU - intubated and hemodynamically stable.   Delay start of Pharmacological VTE agent (>24hrs) due to surgical blood loss or risk of bleeding: yes, determination of the use of chemoprophylaxis will be left to the CCU team

## 2011-09-15 NOTE — Progress Notes (Signed)
Patient ID: [name], female   DOB: 11-08-1945, 66 y.o.   MRN: [id number] S:intubated but awake, comfortable O:BP 157/139  Pulse 77  Temp(Src) 98.7 F (37.1 C) (Oral)  Resp 15  Ht 5\' 7"  (1.702 m)  Wt 120.2 kg (264 lb 15.9 oz)  BMI 41.50 kg/m2  SpO2 100%  Intake/Output Summary (Last 24 hours) at 09/15/11 0846 Last data filed at 09/15/11 0800  Gross per 24 hour  Intake 1418.25 ml  Output   2892 ml  Net -1473.75 ml   Weight change: 1.4 kg (3 lb 1.4 oz) CVS:no rub Resp:CTA [id number] Ext:2+edema on right, trace on left lower ext   Lab 09/15/11 0430 09/14/11 0500 09/13/11 0450 09/12/11 1830 09/12/11 0915 09/11/11 0825 09/10/11 2000  NA 142 140 139140 139 142 137 137  K 3.9 3.6 3.2*3.3* 3.5 5.3* 4.0 4.1  CL 104 106 104105 104 109 105 102  CO2 25 21 2221 22 21 24 26   GLUCOSE 95 97 9899 77 94 90 97  BUN 40* 54* 48*46* 42* 62* 56* 56*  CREATININE 4.83* 5.86* 5.16*5.04* 4.64* 5.85* 5.32* 5.16*  ALB -- -- -- -- -- -- --  CALCIUM 9.2 10.2 11.0*10.9* 11.0* 11.8* 11.1*11.9* 12.7*  PHOS 4.2 3.0 2.72.7 -- 4.8* -- --  AST -- -- -- -- -- -- 24  ALT -- -- -- -- -- -- 9   Liver Function Tests:  Lab 09/15/11 0430 09/14/11 0500 09/13/11 0450 09/10/11 2000  AST -- -- -- 24  ALT -- -- -- 9  ALKPHOS -- -- -- 103  BILITOT -- -- -- 0.2*  PROT -- -- -- 5.5*  ALBUMIN 1.9* 1.7* 1.7* --   No results found for this basename: LIPASE:3,AMYLASE:3 in the last 168 hours No results found for this basename: AMMONIA:3 in the last 168 hours CBC:  Lab 09/15/11 0430 09/14/11 0500 09/13/11 0450 09/12/11 1830 09/10/11 2000  WBC 14.6* 13.4* 13.9* -- --  NEUTROABS -- -- -- -- 8.8*  HGB 9.2* 7.1* 7.5* -- --  HCT 27.6* 21.7* 22.5* -- --  MCV 86.3 85.1 84.0 85.8 86.8  PLT 178 299 290 -- --   Cardiac Enzymes: No results found for this basename: CKTOTAL:5,CKMB:5,CKMBINDEX:5,TROPONINI:5 in the last 168 hours CBG:  Lab 09/15/11 0722 09/15/11 0422 09/15/11 0034 09/14/11 2001 09/14/11 1541    GLUCAP 92 92 93 89 108*    Iron Studies: No results found for this basename: IRON,TIBC,TRANSFERRIN,FERRITIN in the last 72 hours Studies/Results: Dg Chest Port 1 View  09/15/2011  *RADIOLOGY REPORT*  Clinical Data: Intubated  PORTABLE CHEST - 1 VIEW  Comparison:   the previous day's study  Findings: Endotracheal tube, nasogastric tube, and right IJ central line are stable in position.  Stable cardiomegaly.  Relatively low lung volumes with persistent perihilar and bibasilar atelectasis or interstitial infiltrates.  Cannot exclude small left pleural effusion.  IMPRESSION:  1.  Little change from previous day's portable exam.  Original Report Authenticated By: Trecia Rogers, M.D.   Dg Chest Port 1 View  09/14/2011  *RADIOLOGY REPORT*  Clinical Data: Follow up of infiltrate.  Shortness of breath. Morbid obesity.  PORTABLE CHEST - 1 VIEW  Comparison: 1 day prior  Findings: Endotracheal tube is unchanged, with tip at the level of the clavicles.  Nasogastric tube extends beyond the inferior aspect of the film.  Right IJ central line unchanged.  Mild cardiomegaly.  No right pleural effusion.  Cannot exclude small left pleural effusion. No pneumothorax.  Low  lung volumes. Interval development of mild pulmonary venous congestion.  Mild bibasilar volume loss.  IMPRESSION: Cardiomegaly and low lung volumes with mild pulmonary venous congestion.  Similar patchy bibasilar atelectasis.  Possible small left pleural effusion.  Original Report Authenticated By: Areta Haber, M.D.      . albuterol-ipratropium  6 puff Inhalation Q4H  . antiseptic oral rinse  15 mL Mouth Rinse QID  . calcitonin  480 Units Subcutaneous BID  . cefTAZidime (FORTAZ)  IV  2 g Intravenous Q48H  . chlorhexidine  15 mL Mouth Rinse BID  . diltiazem  60 mg Oral Q6H  . furosemide  80 mg Intravenous Q6H  . insulin aspart  0-3 Units Subcutaneous Q4H  . levofloxacin (LEVAQUIN) IV  500 mg Intravenous Q48H  . metoprolol tartrate  50 mg  Oral BID  . pantoprazole (PROTONIX) IV  40 mg Intravenous Q24H  . predniSONE  30 mg Per Tube Q breakfast  . sotalol  80 mg Oral Q48H    BMET    Component Value Date/Time   NA 142 09/15/2011 0430   K 3.9 09/15/2011 0430   CL 104 09/15/2011 0430   CO2 25 09/15/2011 0430   GLUCOSE 95 09/15/2011 0430   BUN 40* 09/15/2011 0430   CREATININE 4.83* 09/15/2011 0430   CALCIUM 9.2 09/15/2011 0430   CALCIUM 11.1* 09/11/2011 0825   GFRNONAA 9* 09/15/2011 0430   GFRAA 10* 09/15/2011 0430   CBC    Component Value Date/Time   WBC 14.6* 09/15/2011 0430   RBC 3.20* 09/15/2011 0430   HGB 9.2* 09/15/2011 0430   HCT 27.6* 09/15/2011 0430   PLT 178 09/15/2011 0430   MCV 86.3 09/15/2011 0430   MCH 28.8 09/15/2011 0430   MCHC 33.3 09/15/2011 0430   RDW 16.8* 09/15/2011 0430   LYMPHSABS 3.1 09/10/2011 2000   MONOABS 0.9 09/10/2011 2000   EOSABS 0.5 09/10/2011 2000   BASOSABS 0.1 09/10/2011 2000     Assessment/Plan: 1. Acute on CRF (oliguric), unclear cause. UA shows mild microhemautria, prot 100. DDx: hypercalcemia, vanco toxicity, post-infectious GN, immune-complex GN due to indolent infection (hip), AIN due to meds, ATN, etc.. MS better overnight but decreased UOP.  Cont with lasix and follow for now.  Will hold off on HD over weekend and re-eval on Monday  2. Hypercalcemia- low iPTH, PTH-related peptide pending. ?due to immobility vs. Paraneoplastic vs iatrogenic. Improved with HD 3. S/P recent removal of hardware due to infected THR joint (R) +psuedomonas on multiple antibiotics would not resume vanco due to ARF and elevated trough levels and negative MRSA screen.  For exploration today per Dr. Alvan [name] 4. HTN  5. Anemia- ABLA and acute illness with CKD at baseline, transfuse as needed 6. CKD baseline creat 2.3, was on 80-160 po lasix bid at home  7. Gout on allopurinol, reduced dose is appropriate  8. Probable PNA, LLL- on IV abx per primary  9. Volume- improved edema LE's post HD with UF of ~2L yesterday, d/c IVF's and give IV lasix  today  10. VDRF per PCCM 11. Dispo-per primary svc [name]

## 2011-09-15 NOTE — Transfer of Care (Signed)
Immediate Anesthesia Transfer of Care Note  Patient: [name]  Procedure(s) Performed: Procedure(s) (LRB): IRRIGATION AND DEBRIDEMENT EXTREMITY (Right)  Patient Location: SICU  Anesthesia Type: General  Level of Consciousness: sedated, unresponsive and Patient remains intubated per anesthesia plan  Airway & Oxygen Therapy: Patient Spontanous Breathing, Patient remains intubated per anesthesia plan and Patient placed on Ventilator (see vital sign flow sheet for setting)  Post-op Assessment: Report given to PACU RN and Post -op Vital signs reviewed and stable  Post vital signs: Reviewed and stable  Complications: No apparent anesthesia complications

## 2011-09-15 NOTE — Progress Notes (Signed)
Consent done per Md

## 2011-09-15 NOTE — Progress Notes (Signed)
Sedation turned off on return form OR to evaluate respiratory status for extubation

## 2011-09-15 NOTE — Anesthesia Postprocedure Evaluation (Signed)
  Anesthesia Post-op Note  Patient: [name]  Procedure(s) Performed: Procedure(s) (LRB): IRRIGATION AND DEBRIDEMENT EXTREMITY (Right)  Patient Location: ICU  Anesthesia Type: General  Level of Consciousness: sedated  Airway and Oxygen Therapy: Patient remains intubated per anesthesia plan  Post-op Pain: none  Post-op Assessment: Post-op Vital signs reviewed, Patient's Cardiovascular Status Stable and Respiratory Function Stable  Post-op Vital Signs: Reviewed and stable  Complications: No apparent anesthesia complications

## 2011-09-15 NOTE — Anesthesia Preprocedure Evaluation (Addendum)
Anesthesia Evaluation  Patient identified by MRN, date of birth, ID band Patient awake    Reviewed: Allergy & Precautions, H&P , NPO status , Patient's Chart, lab work & pertinent test results, reviewed documented beta blocker date and time   Airway      Comment: Came to OR intubated and sedated. Dental  (+) Teeth Intact   Pulmonary asthma , sleep apnea , pneumonia ,          Cardiovascular hypertension, Pt. on medications and Pt. on home beta blockers + dysrhythmias Atrial Fibrillation Rhythm:Regular Rate:Normal     Neuro/Psych PSYCHIATRIC DISORDERS Depression CVA    GI/Hepatic negative GI ROS, Neg liver ROS,   Endo/Other  Diabetes mellitus-, Well Controlled, Type 2  Renal/GU CRFRenal disease     Musculoskeletal   Abdominal (+) + obese,   Peds  Hematology   Anesthesia Other Findings   Reproductive/Obstetrics                          Anesthesia Physical Anesthesia Plan  ASA: IV  Anesthesia Plan: General   Post-op Pain Management:    Induction: Intravenous  Airway Management Planned: Oral ETT  Additional Equipment:   Intra-op Plan:   Post-operative Plan: Post-operative intubation/ventilation  Informed Consent: I have reviewed the patients History and Physical, chart, labs and discussed the procedure including the risks, benefits and alternatives for the proposed anesthesia with the patient or authorized representative who has indicated his/her understanding and acceptance.   History available from chart only  Plan Discussed with: CRNA and Anesthesiologist  Anesthesia Plan Comments:         Anesthesia Quick Evaluation

## 2011-09-15 NOTE — Progress Notes (Signed)
Patient ID: [name], female   DOB: 21-Feb-1946, 66 y.o.   MRN: [id number] Subjective: Intubated and sedated, no events.  2 units PRBCs given last night     Objective:   VITALS:   Filed Vitals:   09/15/11 0700  BP: 129/69  Pulse: 71  Temp:   Resp: 13   Intubated sedated right hip with serous ooze on her dressing  LABS  Basename 09/15/11 0430 09/14/11 0500 09/13/11 0450  HGB 9.2* 7.1* 7.5*  HCT 27.6* 21.7* 22.5*  WBC 14.6* 13.4* 13.9*  PLT 178 299 290     Basename 09/15/11 0430 09/14/11 0500 09/13/11 0450  NA 142 140 139140  K 3.9 3.6 3.2*3.3*  BUN 40* 54* 48*46*  CREATININE 4.83* 5.86* 5.16*5.04*  GLUCOSE 95 97 9899     Basename 09/15/11 0430 09/14/11 2230  LABPT -- --  INR 1.13 1.09     Assessment/Plan:  Persistent wound drainage in setting of recent resection arthroplasty  To OR this am for repeat I&D Right hip placement of antibiotic beads Consent obtained

## 2011-09-15 NOTE — Procedures (Signed)
Extubation Procedure Note  Patient Details:   Name: [name] DOB: [birth year] MRN: [id number]   Airway Documentation:     Evaluation  O2 sats: stable throughout Complications: No apparent complications Patient did tolerate procedure well. Bilateral Breath Sounds: Clear Suctioning: Airway Yes Pt vocalizing well with no stridor noted at this time.   Delrae Alfred C 09/15/2011, 1445

## 2011-09-15 NOTE — Progress Notes (Signed)
Patient sent to OR, Family made aware and allowed to visit before going. VSS.

## 2011-09-15 NOTE — Progress Notes (Signed)
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  She underwent I & D on 2/18 and discharge on 2/21 on Xarelto.  Presented to ED on 2/25 with confusion, slurred speech from nursing facility.  Known to have CKD with baseline creatinine of 2.3 (as of 2/210.  Initial cr of 5.1 on admit with AMS, hypercalcemia.  CT head was negative on 2/25.  2/27 afternoon was noted to have significantly altered mental status by [employer] after HD (1.2 L removed) and PCCM consulted.    Lines / Drains: 2/27 OETT>>> 2/27 OGT>>> 2/19 PICC line>>> Rt IJ HD 2/28>>>  Cultures: 2/25 Urine>>>negative 2/25 Blood>>>NGTD 2/27 Sputum >>normal flora   Antibiotics: 2/25 Maxipime>>>off 2/26 Rocephin>> 2/26 2/25 Levaquin>>> 2/28 vanc>>>off 2/28 Ceftaz>>>  Tests / Events: 2/25 CT HEAD>>>Negative for bleed or other acute intracranial process 2/27 Head CT >>>Stable noncontrast CT appearance the brain without acute intracranial abnormality. 2/28 Ortho called 3/1 remains awake alert, poor output 3/2 to OR for I&D R hip, placement abx beads, removal non purulent seroma 2/28 2D echo>>> EF 50-55%, mild MR, cannot exclude vegetation MV, mod TR, PA press 4mmHg, No obvious vegetation  Overnight Events:  OBJECTIVE Vital Signs: Filed Vitals:   09/15/11 0837 09/15/11 0851 09/15/11 0900 09/15/11 1000  BP:   153/80 142/84  Pulse: 77  87   Temp:      TempSrc:      Resp: 15  15   Height:  5\' 7"  (1.702 m)    Weight:      SpO2: 100%  100%     Intake/Output Summary (Last 24 hours) at 09/15/11 1208 Last data filed at 09/15/11 1130  Gross per 24 hour  Intake 2011.25 ml  Output   3195 ml  Net -1183.75 ml     Physical Examination: General:  Chronically ill in no acute distress Neuro:  drowsy post op, appropriate,. follows commands well HEENT:  PERRL, line clean, ETT  Cardiovascular:  RRR, no M/R/G Lungs:  resps even non labored, shallow on PS10/5, Bilateral, some rhonchi  Abdomen:  Soft, nontender, nondistended, bowel sounds present Musculoskeletal:  Moves all extremities, no pedal edema  Ventilator settings: Vent Mode:  [-] PRVC FiO2 (%):  [30 %] 30 % Set Rate:  [6 bmp] 6 bmp Vt Set:  [600 mL] 600 mL PEEP:  [5 cmH20] 5 cmH20 Pressure Support:  [8 cmH20] 8 cmH20 Plateau Pressure:  [16 cmH20-20 cmH20] 20 cmH20  Labs and Imaging:  CBC    Component Value Date/Time   WBC 14.6* 09/15/2011 0430   RBC 3.20* 09/15/2011 0430   HGB 9.2* 09/15/2011 0430   HCT 27.6* 09/15/2011 0430   PLT 178 09/15/2011 0430   MCV 86.3 09/15/2011 0430   MCH 28.8 09/15/2011 0430   MCHC 33.3 09/15/2011 0430   RDW 16.8* 09/15/2011 0430   LYMPHSABS 3.1 09/10/2011 2000   MONOABS 0.9 09/10/2011 2000   EOSABS 0.5 09/10/2011 2000   BASOSABS 0.1 09/10/2011 2000    BMET    Component Value Date/Time   NA 142 09/15/2011 0430   K 3.9 09/15/2011 0430   CL 104 09/15/2011 0430   CO2 25 09/15/2011 0430   GLUCOSE 95 09/15/2011 0430   BUN 40* 09/15/2011 0430  CREATININE 4.83* 09/15/2011 0430   CALCIUM 9.2 09/15/2011 0430   CALCIUM 11.1* 09/11/2011 0825   GFRNONAA 9* 09/15/2011 0430   GFRAA 10* 09/15/2011 0430    Dg Chest Port 1 View  09/15/2011  *RADIOLOGY REPORT*  Clinical Data: Intubated  PORTABLE CHEST - 1 VIEW  Comparison:   the previous day's study  Findings: Endotracheal tube, nasogastric tube, and right IJ central line are stable in position.  Stable cardiomegaly.  Relatively low lung volumes with persistent perihilar and bibasilar atelectasis or interstitial infiltrates.  Cannot exclude small left pleural effusion.  IMPRESSION:  1.  Little change from previous day's portable exam.  Original Report Authenticated By: Trecia Rogers, M.D.   Dg Chest Port 1 View  09/14/2011  *RADIOLOGY REPORT*  Clinical Data: Follow up of  infiltrate.  Shortness of breath. Morbid obesity.  PORTABLE CHEST - 1 VIEW  Comparison: 1 day prior  Findings: Endotracheal tube is unchanged, with tip at the level of the clavicles.  Nasogastric tube extends beyond the inferior aspect of the film.  Right IJ central line unchanged.  Mild cardiomegaly.  No right pleural effusion.  Cannot exclude small left pleural effusion. No pneumothorax.  Low lung volumes. Interval development of mild pulmonary venous congestion.  Mild bibasilar volume loss.  IMPRESSION: Cardiomegaly and low lung volumes with mild pulmonary venous congestion.  Similar patchy bibasilar atelectasis.  Possible small left pleural effusion.  Original Report Authenticated By: Areta Haber, M.D.    [place]  1. Acute on chronic resp failure - r/t ?aspiration PNA, encephalopathy with underlying asthma and OSA.  Remained intubated to return to OR 3/2. Now tol PS wean post op.  PLAN -  Cont PS wean - likely extubate later today when more awake.  Hope we did not miss window of opportunity 3/1 post HD F/u CXR  Will need qhs CPAP for OSA  BD Prednisone taper    Encephalopathy-  Much improved. Multifactorial (uremia, hypercalcemia, Gabapentin / Hydrocodone, sepsis). CT head negative. PLAN -   Repeat head CT showing no evolving stroke or new bleed as cause of AMS  PRN pain rx    HTN /cardiomyopathy/ Hx Afib- EF 50-55%.  Mild MR, Mod TR.  Now in NSR.  No obvious vegetation on Echo.  PLAN -  Cont Cardizem, lopressor, sotalol home meds     Acute on chronic renal failure - renal following. Unclear etiology.    Lab 09/15/11 0430 09/14/11 0500 09/13/11 0450 09/12/11 1830 09/12/11 0915  CREATININE 4.83* 5.86* 5.16*5.04* 4.64* 5.85*  PLAN   HD per Renal - likely hold until Monday Cont lasix per renal     Anemia -  etiology is not clear.  No overt hemorrhage.  On Xarelto prior to admission.  Lab 09/15/11 0430 09/14/11 0500 09/13/11 0450 09/12/11 1830 09/10/11 2000  HGB  9.2* 7.1* 7.5* 7.7* 8.3*  PLAN -   CBC in AM  Hold Xarelto  holding anticoag for now   SIRS/ Sepsis - Much improved.  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 Also suspected aspiration pneumonia. No obvious vegetation on Echo.  Hx pseudomonas.  Pct negative. To Or 3/2 for washout, did have seroma but non purulent.  VAC placed.  PLAN -  Cont abx - ceftaz, levaquin  Follow cultures  trend fever, wbc  BEST PRACTICE / DISPOSITION -->  ICU status under PCCM -->  Full  code -->  SCD's  -->  Protonix IV for GI Px -->  Ventilator bundle -->  No family available   Darlina Sicilian, NP 09/15/2011  11:50 AM Pager: [phone]  Reviewed above, examined pt and agree with assessment/plan.  She is more alert now and tolerating SBT.  Will proceed with extubation.  Will need CPAP post-extubation.  Updated family at bedside.  Chesley Mires, MD 09/15/2011, 1:23 PM Pager:  [phone]  Critical care time 30 minutes.

## 2011-09-16 ENCOUNTER — Inpatient Hospital Stay (HOSPITAL_COMMUNITY): Payer: Medicare Other

## 2011-09-16 DIAGNOSIS — G9340 Encephalopathy, unspecified: Secondary | ICD-10-CM

## 2011-09-16 DIAGNOSIS — N179 Acute kidney failure, unspecified: Secondary | ICD-10-CM

## 2011-09-16 DIAGNOSIS — J9600 Acute respiratory failure, unspecified whether with hypoxia or hypercapnia: Secondary | ICD-10-CM

## 2011-09-16 LAB — TYPE AND SCREEN
ABO/RH(D): O POS
Antibody Screen: NEGATIVE
Unit division: 0
Unit division: 0

## 2011-09-16 LAB — RENAL FUNCTION PANEL
Albumin: 1.8 g/dL — ABNORMAL LOW (ref 3.5–5.2)
BUN: 46 mg/dL — ABNORMAL HIGH (ref 6–23)
CO2: 25 mEq/L (ref 19–32)
Calcium: 9.9 mg/dL (ref 8.4–10.5)
Chloride: 105 mEq/L (ref 96–112)
Creatinine, Ser: 5.71 mg/dL — ABNORMAL HIGH (ref 0.50–1.10)
GFR calc Af Amer: 8 mL/min — ABNORMAL LOW (ref >90)
GFR calc non Af Amer: 7 mL/min — ABNORMAL LOW (ref >90)
Glucose, Bld: 78 mg/dL (ref 70–99)
Phosphorus: 4.9 mg/dL — ABNORMAL HIGH (ref 2.3–4.6)
Potassium: 4 mEq/L (ref 3.5–5.1)
Sodium: 141 mEq/L (ref 135–145)

## 2011-09-16 LAB — CBC
HCT: 28.8 % — ABNORMAL LOW (ref 36.0–46.0)
Hemoglobin: 9.4 g/dL — ABNORMAL LOW (ref 12.0–15.0)
MCH: 28.8 pg (ref 26.0–34.0)
MCHC: 32.6 g/dL (ref 30.0–36.0)
MCV: 88.3 fL (ref 78.0–100.0)
Platelets: 193 10*3/uL (ref 150–400)
RBC: 3.26 MIL/uL — ABNORMAL LOW (ref 3.87–5.11)
RDW: 16.9 % — ABNORMAL HIGH (ref 11.5–15.5)
WBC: 13.5 10*3/uL — ABNORMAL HIGH (ref 4.0–10.5)

## 2011-09-16 LAB — GLUCOSE, CAPILLARY
Glucose-Capillary: 69 mg/dL — ABNORMAL LOW (ref 70–99)
Glucose-Capillary: 72 mg/dL (ref 70–99)
Glucose-Capillary: 73 mg/dL (ref 70–99)
Glucose-Capillary: 74 mg/dL (ref 70–99)
Glucose-Capillary: 95 mg/dL (ref 70–99)
Glucose-Capillary: 99 mg/dL (ref 70–99)

## 2011-09-16 LAB — VANCOMYCIN, RANDOM: Vancomycin Rm: 27.2 ug/mL

## 2011-09-16 IMAGING — CR DG CHEST 1V PORT
1 series · 1 of 1 positions shown · non-contrast
Comparison: the previous day's study

CLINICAL DATA: Respiratory failure

PORTABLE CHEST - 1 VIEW

[view not recorded]
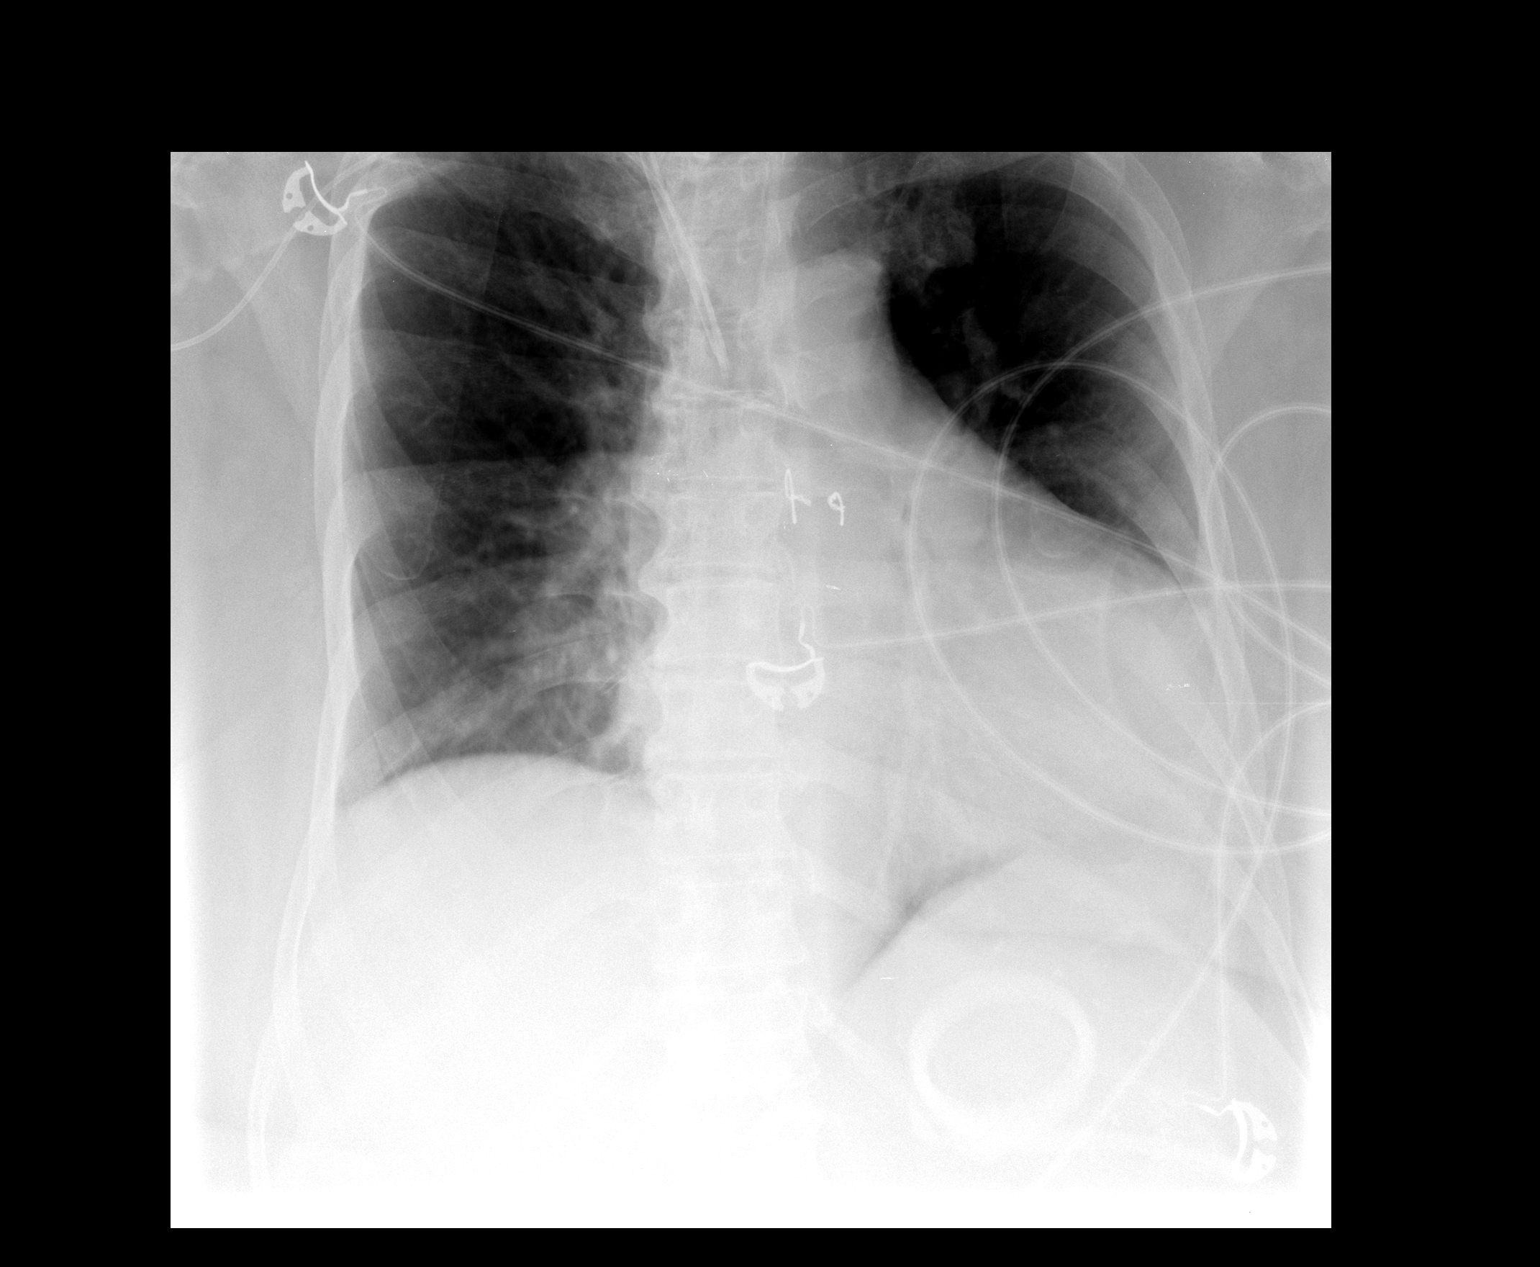

[1 of 1 positions shown; findings below may reference images not displayed]

FINDINGS: Right IJ central line and left to right arm PICC remain
in place.  The patient has been extubated and the nasogastric tube
removed.  Stable cardiomegaly.  Cannot exclude small left pleural
effusion.  Slightly improved aeration with concomitant decrease in
perihilar and bibasilar atelectasis or interstitial infiltrates.
IMPRESSION: 1.  Extubation with slightly improved aeration.

## 2011-09-16 MED ORDER — PANTOPRAZOLE SODIUM 40 MG PO TBEC
40.0000 mg | DELAYED_RELEASE_TABLET | Freq: Every day | ORAL | Status: DC
  Administered 2011-09-16 – 2011-09-17 (×2): 40 mg via ORAL
  Filled 2011-09-16 (×2): qty 1

## 2011-09-16 MED ORDER — SODIUM CHLORIDE 0.9 % IJ SOLN
INTRAMUSCULAR | Status: AC
  Administered 2011-09-16: 10 mL
  Filled 2011-09-16: qty 10

## 2011-09-16 NOTE — Progress Notes (Signed)
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  She underwent I & D on 2/18 and discharge on 2/21 on Xarelto.  Presented to ED on 2/25 with confusion, slurred speech from nursing facility.  Known to have CKD with baseline creatinine of 2.3 (as of 2/210.  Initial cr of 5.1 on admit with AMS, hypercalcemia.  CT head was negative on 2/25.  2/27 afternoon was noted to have significantly altered mental status by [facility] after HD (1.2 L removed) and PCCM consulted.    Lines / Drains: 2/27 OETT>>>3/2 2/27 OGT>>>3/2 2/19 PICC line>>> Rt IJ HD 2/28>>>  Cultures: 2/25 Urine>>>negative 2/25 Blood>>>NGTD>>> 2/27 Sputum >>normal flora   Antibiotics: 2/25 Maxipime>>>off 2/26 Rocephin>> 2/26 2/25 Levaquin>>> 2/28 vanc>>>off 2/28 Ceftaz>>>  Tests / Events: 2/25 CT HEAD>>>Negative for bleed or other acute intracranial process 2/27 Head CT >>>Stable noncontrast CT appearance the brain without acute intracranial abnormality. 2/28 Ortho called 3/1 remains awake alert, poor output 3/2 to OR for I&D R hip, placement abx beads, removal non purulent seroma 2/28 2D echo>>> EF 50-55%, mild MR, cannot exclude vegetation MV, mod TR, PA press 61mmHg, No obvious vegetation  Overnight Events: No events overnight.  "Feeling much better".  Wore CPAP overnight.   OBJECTIVE Vital Signs: Filed Vitals:   09/16/11 0739 09/16/11 0800 09/16/11 0900 09/16/11 0934  BP:  118/65  157/88  Pulse:  82 86 86  Temp: 98.2 F (36.8 C)     TempSrc: Axillary     Resp:  15 16   Height:      Weight:      SpO2:  100% 100%     Intake/Output Summary (Last 24 hours) at 09/16/11 1007 Last data filed at 09/16/11 0900  Gross per 24 hour  Intake    988 ml  Output   1515 ml  Net   -527  ml     Physical Examination: General:  Chronically ill in no acute distress Neuro: awake and alert, appropriate, significant gen weakness HEENT:  PERRL, line clean, mm moist Cardiovascular:  RRR, no M/R/G Lungs:  resps even non labored, diminished bases, few scattered ronchi Abdomen:  Soft, nontender, nondistended, bowel sounds present Musculoskeletal:  Moves all extremities, no pedal edema, R hip with wound vac c/d   Labs and Imaging:  CBC    Component Value Date/Time   WBC 13.5* 09/16/2011 0500   RBC 3.26* 09/16/2011 0500   HGB 9.4* 09/16/2011 0500   HCT 28.8* 09/16/2011 0500   PLT 193 09/16/2011 0500   MCV 88.3 09/16/2011 0500   MCH 28.8 09/16/2011 0500   MCHC 32.6 09/16/2011 0500   RDW 16.9* 09/16/2011 0500   LYMPHSABS 3.1 09/10/2011 2000   MONOABS 0.9 09/10/2011 2000   EOSABS 0.5 09/10/2011 2000   BASOSABS 0.1 09/10/2011 2000    BMET    Component Value Date/Time   NA 141 09/16/2011 0500   K 4.0 09/16/2011 0500   CL 105 09/16/2011 0500   CO2 25 09/16/2011 0500   GLUCOSE 78 09/16/2011 0500   BUN 46* 09/16/2011 0500   CREATININE 5.71* 09/16/2011 0500   CALCIUM 9.9 09/16/2011 0500   CALCIUM 11.1* 09/11/2011 0825   GFRNONAA 7* 09/16/2011 0500   GFRAA 8* 09/16/2011  0500    Dg Chest Port 1 View  09/16/2011  *RADIOLOGY REPORT*  Clinical Data: Respiratory failure  PORTABLE CHEST - 1 VIEW  Comparison:   the previous day's study  Findings: Right IJ central line and left to right arm PICC remain in place.  The patient has been extubated and the nasogastric tube removed.  Stable cardiomegaly.  Cannot exclude small left pleural effusion.  Slightly improved aeration with concomitant decrease in perihilar and bibasilar atelectasis or interstitial infiltrates.  IMPRESSION:  1.  Extubation with slightly improved aeration.  Original Report Authenticated By: Trecia Rogers, M.D.   Dg Chest Port 1 View  09/15/2011  *RADIOLOGY REPORT*  Clinical Data: Intubated  PORTABLE CHEST - 1 VIEW  Comparison:   the previous day's  study  Findings: Endotracheal tube, nasogastric tube, and right IJ central line are stable in position.  Stable cardiomegaly.  Relatively low lung volumes with persistent perihilar and bibasilar atelectasis or interstitial infiltrates.  Cannot exclude small left pleural effusion.  IMPRESSION:  1.  Little change from previous day's portable exam.  Original Report Authenticated By: Trecia Rogers, M.D.    ASSESSMENT AND PLAN  1. Acute on chronic resp failure - r/t ?aspiration PNA, encephalopathy with underlying asthma and OSA.  Remained intubated to return to OR 3/2 then extubated 3/2 post op.  PLAN -  qhs CPAP F/u CXR  BD Cont prednisone taper   Encephalopathy-  Resolved    HTN /cardiomyopathy/ Hx Afib- EF 50-55%.  Mild MR, Mod TR.  Now in NSR.  No obvious vegetation on Echo.  PLAN -  Cont Cardizem, lopressor, sotalol home meds     Acute on chronic renal failure - renal following. Unclear etiology.    Lab 09/16/11 0500 09/15/11 0430 09/14/11 0500 09/13/11 0450 09/12/11 1830  CREATININE 5.71* 4.83* 5.86* 5.16*5.04* 4.64*  PLAN   HD per Renal - likely hold until Monday Cont lasix per renal     Anemia -  etiology is not clear.  No overt hemorrhage.  On Xarelto prior to admission. Lab 09/16/11 0500 09/15/11 0430 09/14/11 0500 09/13/11 0450 09/12/11 1830  HGB 9.4* 9.2* 7.1* 7.5* 7.7*  PLAN -   CBC in AM  Hold Xarelto  holding anticoag for now   SIRS/ Sepsis - Much improved.  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 Also suspected aspiration pneumonia. No obvious vegetation on Echo.  Hx pseudomonas.  Pct negative. To Or 3/2 for washout, did have seroma but non purulent.  VAC placed. Afebrile.   Lab 09/16/11 0500 09/15/11 0430 09/14/11 0500 09/13/11 0450 09/12/11 1830  WBC 13.5* 14.6* 13.4* 13.9* 13.4*  PLAN -  Cont abx - ceftaz, levaquin  Follow cultures  trend fever, wbc  R hip  prosthesis removal -- per Ortho PLAN -  Will order PT/OT   Will tx to [facility] / DISPOSITION --> SDU under PCCM -->  Full code -->  SCD's  -->  Protonix IV for GI Px -->  Ventilator bundle -->  No family available   WHITEHEART,KATHRYN, NP 09/16/2011  10:07 AM Pager: [phone]  Reviewed above, examined pt, and agree with assessment/plan.  Respiratory status stable after extubation.  Maintaining Hb.  Urine outpt improved.  Updated family at bedside.  Chesley Mires, MD 09/16/2011, 11:57 AM Pager:  [phone]

## 2011-09-16 NOTE — Progress Notes (Signed)
Patient ID: [name], female   DOB: [birth year], 66 y.o.   MRN: [id number] S:wearing BiPAP, awake and alert, no new complaints O:BP 129/74  Pulse 87  Temp(Src) 98.2 F (36.8 C) (Axillary)  Resp 15  Ht 5\' 7"  (1.702 m)  Wt 116.2 kg (256 lb 2.8 oz)  BMI 40.12 kg/m2  SpO2 100%  Intake/Output Summary (Last 24 hours) at 09/16/11 0812 Last data filed at 09/16/11 0600  Gross per 24 hour  Intake    993 ml  Output   1425 ml  Net   -432 ml   Weight change: -8 kg (-17 lb 10.2 oz) Gen:WD obese AAF wearing BiPAP CVS:rrr Resp:scattered rhonchi [id number] Ext:2+edema on right leg, wound vac in place   Lab 09/16/11 0500 09/15/11 0430 09/14/11 0500 09/13/11 0450 09/12/11 1830 09/12/11 0915 09/11/11 0825 09/10/11 2000  NA 141 142 140 139140 139 142 137 --  K 4.0 3.9 3.6 3.2*3.3* 3.5 5.3* 4.0 --  CL 105 104 106 104105 104 109 105 --  CO2 25 25 21  2221 22 21 24  --  GLUCOSE 78 95 97 9899 77 94 90 --  BUN 46* 40* 54* 48*46* 42* 62* 56* --  CREATININE 5.71* 4.83* 5.86* 5.16*5.04* 4.64* 5.85* 5.32* --  ALB -- -- -- -- -- -- -- --  CALCIUM 9.9 9.2 10.2 11.0*10.9* 11.0* 11.8* 11.1*11.9* --  PHOS 4.9* 4.2 3.0 2.72.7 -- 4.8* -- --  AST -- -- -- -- -- -- -- 24  ALT -- -- -- -- -- -- -- 9   Liver Function Tests:  Lab 09/16/11 0500 09/15/11 0430 09/14/11 0500 09/10/11 2000  AST -- -- -- 24  ALT -- -- -- 9  ALKPHOS -- -- -- 103  BILITOT -- -- -- 0.2*  PROT -- -- -- 5.5*  ALBUMIN 1.8* 1.9* 1.7* --   No results found for this basename: LIPASE:3,AMYLASE:3 in the last 168 hours No results found for this basename: AMMONIA:3 in the last 168 hours CBC:  Lab 09/16/11 0500 09/15/11 0430 09/14/11 0500 09/13/11 0450 09/12/11 1830 09/10/11 2000  WBC 13.5* 14.6* 13.4* -- -- --  NEUTROABS -- -- -- -- -- 8.8*  HGB 9.4* 9.2* 7.1* -- -- --  HCT 28.8* 27.6* 21.7* -- -- --  MCV 88.3 86.3 85.1 84.0 85.8 --  PLT 193 178 299 -- -- --   Cardiac Enzymes: No results found for this basename:  CKTOTAL:5,CKMB:5,CKMBINDEX:5,TROPONINI:5 in the last 168 hours CBG:  Lab 09/16/11 0346 09/15/11 2355 09/15/11 2002 09/15/11 1515 09/15/11 0722  GLUCAP 74 72 87 101* 92    Iron Studies: No results found for this basename: IRON,TIBC,TRANSFERRIN,FERRITIN in the last 72 hours Studies/Results: Dg Chest Port 1 View  09/16/2011  *RADIOLOGY REPORT*  Clinical Data: Respiratory failure  PORTABLE CHEST - 1 VIEW  Comparison:   the previous day's study  Findings: Right IJ central line and left to right arm PICC remain in place.  The patient has been extubated and the nasogastric tube removed.  Stable cardiomegaly.  Cannot exclude small left pleural effusion.  Slightly improved aeration with concomitant decrease in perihilar and bibasilar atelectasis or interstitial infiltrates.  IMPRESSION:  1.  Extubation with slightly improved aeration.  Original Report Authenticated By: Trecia Rogers, M.D.   Dg Chest Port 1 View  09/15/2011  *RADIOLOGY REPORT*  Clinical Data: Intubated  PORTABLE CHEST - 1 VIEW  Comparison:   the previous day's study  Findings: Endotracheal tube, nasogastric tube, and right IJ central  line are stable in position.  Stable cardiomegaly.  Relatively low lung volumes with persistent perihilar and bibasilar atelectasis or interstitial infiltrates.  Cannot exclude small left pleural effusion.  IMPRESSION:  1.  Little change from previous day's portable exam.  Original Report Authenticated By: Trecia Rogers, M.D.      . antiseptic oral rinse  15 mL Mouth Rinse QID  . cefTAZidime (FORTAZ)  IV  2 g Intravenous Q48H  . chlorhexidine  15 mL Mouth Rinse BID  . diltiazem  60 mg Oral Q6H  . furosemide  80 mg Intravenous Q6H  . insulin aspart  0-3 Units Subcutaneous Q4H  . levofloxacin (LEVAQUIN) IV  500 mg Intravenous Q48H  . metoprolol tartrate  50 mg Oral BID  . pantoprazole sodium  40 mg Oral Q1200  . predniSONE  30 mg Oral Q breakfast  . sotalol  80 mg Oral Q48H  . tobramycin   1.2 g Topical To OR  . tobramycin  1.2 g Topical To OR  . vancomycin  2,000 mg Other To OR  . DISCONTD: albuterol-ipratropium  6 puff Inhalation Q4H  . DISCONTD: pantoprazole (PROTONIX) IV  40 mg Intravenous Q24H  . DISCONTD: pantoprazole sodium  40 mg Per Tube Q1200  . DISCONTD: predniSONE  30 mg Per Tube Q breakfast  . DISCONTD: predniSONE  30 mg Per Tube Q breakfast    BMET    Component Value Date/Time   NA 141 09/16/2011 0500   K 4.0 09/16/2011 0500   CL 105 09/16/2011 0500   CO2 25 09/16/2011 0500   GLUCOSE 78 09/16/2011 0500   BUN 46* 09/16/2011 0500   CREATININE 5.71* 09/16/2011 0500   CALCIUM 9.9 09/16/2011 0500   CALCIUM 11.1* 09/11/2011 0825   GFRNONAA 7* 09/16/2011 0500   GFRAA 8* 09/16/2011 0500   CBC    Component Value Date/Time   WBC 13.5* 09/16/2011 0500   RBC 3.26* 09/16/2011 0500   HGB 9.4* 09/16/2011 0500   HCT 28.8* 09/16/2011 0500   PLT 193 09/16/2011 0500   MCV 88.3 09/16/2011 0500   MCH 28.8 09/16/2011 0500   MCHC 32.6 09/16/2011 0500   RDW 16.9* 09/16/2011 0500   LYMPHSABS 3.1 09/10/2011 2000   MONOABS 0.9 09/10/2011 2000   EOSABS 0.5 09/10/2011 2000   BASOSABS 0.1 09/10/2011 2000    sessment/Plan:  1. Acute on CRF- now non-oliguric (as of yet unclear cause). UA shows mild microhemautria, prot 100. DDx: hypercalcemia, vanco toxicity, post-infectious GN, immune-complex GN due to indolent infection (hip), AIN due to meds, ATN, etc.. MS better overnight and increased UOP. Cont with lasix and follow for now. Will hold off on HD over weekend and re-eval on Monday but overall UOP has doubled and good sign  2. Hypercalcemia- low iPTH, PTH-related peptide pending. ?due to immobility vs. Paraneoplastic vs iatrogenic. Improved with HD 3. S/P recent removal of hardware due to infected THR joint (R) +psuedomonas on multiple antibiotics would not resume vanco due to ARF and elevated trough levels and negative MRSA screen. S/P exploration yesterday per Dr. Alvan Dame with sterile seroma 4. HTN  5. Anemia-  ABLA and acute illness with CKD at baseline, transfuse as needed 6. CKD baseline creat 2.3, was on 80-160 po lasix bid at home  7. Gout on allopurinol, reduced dose is appropriate  8. Probable PNA, LLL- ?aspiration? on IV abx per primary  9. Volume- improved edema LE's post HD with UF of ~2L yesterday, d/c IVF's and give IV lasix  today  10. VDRF on BiPAP per PCCM 11. Dispo-per primary svc 12.   [place] A

## 2011-09-16 NOTE — Progress Notes (Signed)
ANTIBIOTIC CONSULT NOTE - FOLLOW UP  Pharmacy Consult for Vancomycin Indication: Suspected PNA  Allergies  Allergen Reactions  . Coumadin Other (See Comments)    Caused her to have a stroke    Patient Measurements: Height: 5\' 7"  (170.2 cm) Weight: 256 lb 2.8 oz (116.2 kg) IBW/kg (Calculated) : 61.6   Vital Signs: Temp: 98.2 F (36.8 C) (03/03 1202) Temp src: Oral (03/03 1202) BP: 158/83 mmHg (03/03 1100) Pulse Rate: 84  (03/03 1100) Intake/Output from previous day: 03/02 0701 - 03/03 0700 In: 1038 [I.V.:950; NG/GT:30; IV Piggyback:58] Out: [id number] [Urine:1025; Drains:350; Blood:50] Intake/Output from this shift: Total I/O In: 138 [P.O.:100; I.V.:30; IV Piggyback:8] Out: 475 [Urine:175; Drains:300]  Labs:  Basename 09/16/11 0500 09/15/11 0430 09/14/11 0500  WBC 13.5* 14.6* 13.4*  HGB 9.4* 9.2* 7.1*  PLT 193 178 299  LABCREA -- -- --  CREATININE 5.71* 4.83* 5.86*   Estimated Creatinine Clearance: 13.3 ml/min (by C-G formula based on Cr of 5.71).  Basename 09/16/11 0500 09/14/11 1000  VANCOTROUGH -- --  VANCOPEAK -- --  VANCORANDOM 27.2 28.7  GENTTROUGH -- --  GENTPEAK -- --  GENTRANDOM -- --  TOBRATROUGH -- --  TOBRAPEAK -- --  TOBRARND -- --  AMIKACINPEAK -- --  AMIKACINTROU -- --  AMIKACIN -- --     Microbiology: Recent Results (from the past 720 hour(s))  SURGICAL PCR SCREEN     Status: Abnormal   Collection Time   08/28/11 10:33 AM      Component Value Range Status Comment   MRSA, PCR NEGATIVE  NEGATIVE  Final    Staphylococcus aureus POSITIVE (*) NEGATIVE  Final   GRAM STAIN     Status: Normal   Collection Time   09/03/11  8:08 AM      Component Value Range Status Comment   Specimen Description FLUID SYNOVIAL RIGHT HIP ON SWAB   Final    Special Requests NONE   Final    Gram Stain     Final    Value: ABUNDANT WBC PRESENT, PREDOMINANTLY PMN     NO ORGANISMS SEEN     Gram Stain Report Called to,Read Back By and Verified With:     DAVENPORT,S. RN  AT [id number] ON 09/03/11 BY GILLESPIE,B.   Report Status 09/03/2011 FINAL   Final   BODY FLUID CULTURE     Status: Normal   Collection Time   09/03/11  8:09 AM      Component Value Range Status Comment   Specimen Description FLUID SYNOVIAL RIGHT HIP ON SWAB   Final    Special Requests NONE   Final    Gram Stain     Final    Value: ABUNDANT WBC PRESENT, PREDOMINANTLY PMN     NO ORGANISMS SEEN     Gram Stain Report Called to,Read Back By and Verified With: Gram Stain Report Called to,Read Back By and Verified With: Carolanne Grumbling RN @ [phone] ON 09/03/11 BY Ethel Rana B Performed by [facility]   Culture     Final    Value: RARE PSEUDOMONAS AERUGINOSA     Note: CRITICAL RESULT CALLED TO, READ BACK BY AND VERIFIED WITH: KIMBERLY OWENS   Report Status 09/06/2011 FINAL   Final    Organism ID, Bacteria PSEUDOMONAS AERUGINOSA   Final   ANAEROBIC CULTURE     Status: Normal   Collection Time   09/03/11  8:10 AM      Component Value Range Status Comment   Specimen Description  FLUID SYNOVIAL RIGHT HIP   Final    Special Requests NONE   Final    Gram Stain     Final    Value: NO WBC SEEN     NO SQUAMOUS EPITHELIAL CELLS SEEN     NO ORGANISMS SEEN   Culture NO ANAEROBES ISOLATED   Final    Report Status 09/07/2011 FINAL   Final   CULTURE, BLOOD (ROUTINE X 2)     Status: Normal (Preliminary result)   Collection Time   09/10/11  7:55 PM      Component Value Range Status Comment   Specimen Description BLOOD   Final    Special Requests BOTTLES DRAWN AEROBIC AND ANAEROBIC 3CC   Final    Culture  Setup Time [id number]   Final    Culture     Final    Value:        BLOOD CULTURE RECEIVED NO GROWTH TO DATE CULTURE WILL BE HELD FOR 5 DAYS BEFORE ISSUING A FINAL NEGATIVE REPORT   Report Status PENDING   Incomplete   CULTURE, BLOOD (ROUTINE X 2)     Status: Normal (Preliminary result)   Collection Time   09/10/11  8:00 PM      Component Value Range Status Comment   Specimen Description BLOOD LEFT ARM    Final    Special Requests BOTTLES DRAWN AEROBIC AND ANAEROBIC 4CC   Final    Culture  Setup Time [id number]   Final    Culture     Final    Value:        BLOOD CULTURE RECEIVED NO GROWTH TO DATE CULTURE WILL BE HELD FOR 5 DAYS BEFORE ISSUING A FINAL NEGATIVE REPORT   Report Status PENDING   Incomplete   URINE CULTURE     Status: Normal   Collection Time   09/10/11 10:53 PM      Component Value Range Status Comment   Specimen Description URINE, CATHETERIZED   Final    Special Requests NONE   Final    Culture  Setup Time [id number]   Final    Colony Count NO GROWTH   Final    Culture NO GROWTH   Final    Report Status 09/12/2011 FINAL   Final   MRSA PCR SCREENING     Status: Normal   Collection Time   09/11/11 12:48 AM      Component Value Range Status Comment   MRSA by PCR NEGATIVE  NEGATIVE  Final   CULTURE, RESPIRATORY     Status: Normal   Collection Time   09/12/11  6:53 PM      Component Value Range Status Comment   Specimen Description TRACHEAL ASPIRATE   Final    Special Requests NONE   Final    Gram Stain     Final    Value: NO WBC SEEN     MODERATE SQUAMOUS EPITHELIAL CELLS PRESENT     RARE GRAM POSITIVE COCCI IN PAIRS   Culture Non-Pathogenic Oropharyngeal-type Flora Isolated.   Final    Report Status 09/14/2011 FINAL   Final     Anti-infectives     Start     Dose/Rate Route Frequency Ordered Stop   09/15/11 1110   tobramycin (NEBCIN) powder  Status:  Discontinued          As needed 09/15/11 1117 09/15/11 1135   09/15/11 1110   vancomycin (VANCOCIN) powder  Status:  Discontinued  As needed 09/15/11 1118 09/15/11 1135   09/15/11 1015   vancomycin (VANCOCIN) powder 2,000 mg  Status:  Discontinued        2,000 mg Other To Surgery 09/15/11 1009 09/16/11 1018   09/15/11 1015   tobramycin (NEBCIN) powder 1.2 g  Status:  Discontinued        1.2 g Topical To Surgery 09/15/11 1010 09/16/11 1018   09/15/11 1015   tobramycin (NEBCIN) powder 1.2 g  Status:   Discontinued        1.2 g Topical To Surgery 09/15/11 1010 09/16/11 1018   09/13/11 1100   cefTAZidime (FORTAZ) 2 g in dextrose 5 % 50 mL IVPB        2 g 100 mL/hr over 30 Minutes Intravenous Every 48 hours 09/13/11 1016     09/12/11 2200   levofloxacin (LEVAQUIN) IVPB 500 mg        500 mg 100 mL/hr over 60 Minutes Intravenous Every 48 hours 09/11/11 0042     09/12/11 0200   ceFEPIme (MAXIPIME) 1 g in dextrose 5 % 50 mL IVPB  Status:  Discontinued        1 g 100 mL/hr over 30 Minutes Intravenous Every 24 hours 09/11/11 1003 09/13/11 0851   09/11/11 0045   cefTRIAXone (ROCEPHIN) 1 g in dextrose 5 % 50 mL IVPB  Status:  Discontinued        1 g 100 mL/hr over 30 Minutes Intravenous Daily at bedtime 09/11/11 0017 09/11/11 1003   09/10/11 2300   vancomycin (VANCOCIN) 2,000 mg in sodium chloride 0.9 % 500 mL IVPB        2,000 mg 250 mL/hr over 120 Minutes Intravenous  Once 09/10/11 2200 09/11/11 0513   09/10/11 2145   ceFEPIme (MAXIPIME) 1 g in dextrose 5 % 50 mL IVPB        1 g 100 mL/hr over 30 Minutes Intravenous  Once 09/10/11 2138 09/11/11 0241   09/10/11 2145   Levofloxacin (LEVAQUIN) IVPB 750 mg        750 mg 100 mL/hr over 90 Minutes Intravenous  Once 09/10/11 2138 09/10/11 2323          Assessment: Day #6 Vancomycin/Levaquin, Day #4 fortaz (changed from cefepime for increased BBB penetration) for empiric HCAP, h/o pan-S pseudomonas hip-infection. Patient has acute on chronic renal failure that appears to be improving based on UOP.  Patient had 2 HD sessions but may not need any more per renal.  Random Vanc level 27.2 today so she does not need any additional Vancomycin at this time.  Her Tressie Ellis regimen is appropriate but may require adjustment if her renal function continues to improve.  Goal of Therapy:  Vancomycin trough 15-20 mcg/ml Appropriate antibiotic dosing.  Plan:  1.  No additional Vancomycin is needed today.  Will recheck random Vancomycin level on 3/5 and  redose when <15. 2.  Continue Fortaz 2g IV q48h. Will follow renal function and adjust dose as necessary. 3.  Continue Levaquin 500 mg IV q48h. Will follow renal function and adjust dose as necessary.  Legrand Como, Pharm.D., BCPS Clinical Pharmacist  Pager [phone] 09/16/2011, 12:53 PM

## 2011-09-16 NOTE — Progress Notes (Signed)
Patient ID: [name], female   DOB: Jul 19, 1945, 66 y.o.   MRN: [id number] Subjective: 1 Day Post-Op Procedure(s) (LRB): IRRIGATION AND DEBRIDEMENT EXTREMITY (Right)    Patient reports pain as mild. Awake alert extubated, smiling.  No major events  Objective:   VITALS:   Filed Vitals:   09/16/11 0934  BP: 157/88  Pulse: 86  Temp:   Resp:     Neurovascular intact Incision: dressing C/D/I including wound vac sponge.  Wound vac output 350cc serosanginous fluid  LABS  Basename 09/16/11 0500 09/15/11 0430 09/14/11 0500  HGB 9.4* 9.2* 7.1*  HCT 28.8* 27.6* 21.7*  WBC 13.5* 14.6* 13.4*  PLT 193 178 299     Basename 09/16/11 0500 09/15/11 0430 09/14/11 0500  NA 141 142 140  K 4.0 3.9 3.6  BUN 46* 40* 54*  CREATININE 5.71* 4.83* 5.86*  GLUCOSE 78 95 97     Basename 09/15/11 0430 09/14/11 2230  LABPT -- --  INR 1.13 1.09     Assessment/Plan: 1 Day Post-Op Procedure(s) (LRB): IRRIGATION AND DEBRIDEMENT EXTREMITY (Right)   Continue medical management Continue IV ABX WOund team to eval right thigh wound vac tom for first change, needs to be done as clean as possible

## 2011-09-17 DIAGNOSIS — A419 Sepsis, unspecified organism: Secondary | ICD-10-CM

## 2011-09-17 LAB — RENAL FUNCTION PANEL
Albumin: 1.9 g/dL — ABNORMAL LOW (ref 3.5–5.2)
BUN: 55 mg/dL — ABNORMAL HIGH (ref 6–23)
CO2: 24 meq/L (ref 19–32)
Calcium: 11 mg/dL — ABNORMAL HIGH (ref 8.4–10.5)
Chloride: 103 meq/L (ref 96–112)
Creatinine, Ser: 6.46 mg/dL — ABNORMAL HIGH (ref 0.50–1.10)
GFR calc Af Amer: 7 mL/min — ABNORMAL LOW
GFR calc non Af Amer: 6 mL/min — ABNORMAL LOW
Glucose, Bld: 84 mg/dL (ref 70–99)
Phosphorus: 5.2 mg/dL — ABNORMAL HIGH (ref 2.3–4.6)
Potassium: 4 meq/L (ref 3.5–5.1)
Sodium: 140 meq/L (ref 135–145)

## 2011-09-17 LAB — CBC
HCT: 24.8 % — ABNORMAL LOW (ref 36.0–46.0)
Hemoglobin: 8.2 g/dL — ABNORMAL LOW (ref 12.0–15.0)
MCH: 28.4 pg (ref 26.0–34.0)
MCHC: 33.1 g/dL (ref 30.0–36.0)
MCV: 85.8 fL (ref 78.0–100.0)
Platelets: 209 10*3/uL (ref 150–400)
RBC: 2.89 MIL/uL — ABNORMAL LOW (ref 3.87–5.11)
RDW: 16.5 % — ABNORMAL HIGH (ref 11.5–15.5)
WBC: 16 10*3/uL — ABNORMAL HIGH (ref 4.0–10.5)

## 2011-09-17 LAB — GLUCOSE, CAPILLARY
Glucose-Capillary: 109 mg/dL — ABNORMAL HIGH (ref 70–99)
Glucose-Capillary: 126 mg/dL — ABNORMAL HIGH (ref 70–99)
Glucose-Capillary: 140 mg/dL — ABNORMAL HIGH (ref 70–99)
Glucose-Capillary: 76 mg/dL (ref 70–99)
Glucose-Capillary: 86 mg/dL (ref 70–99)
Glucose-Capillary: 90 mg/dL (ref 70–99)
Glucose-Capillary: 98 mg/dL (ref 70–99)

## 2011-09-17 LAB — CULTURE, BLOOD (ROUTINE X 2)
Culture  Setup Time: 201302260142
Culture  Setup Time: 201302260142
Culture: NO GROWTH
Culture: NO GROWTH

## 2011-09-17 LAB — PROTEIN ELECTROPHORESIS, SERUM
Albumin ELP: 45 % — ABNORMAL LOW (ref 55.8–66.1)
Alpha-1-Globulin: 9.3 % — ABNORMAL HIGH (ref 2.9–4.9)
Alpha-2-Globulin: 13.6 % — ABNORMAL HIGH (ref 7.1–11.8)
Beta 2: 6.2 % (ref 3.2–6.5)
Beta Globulin: 3.8 % — ABNORMAL LOW (ref 4.7–7.2)
Gamma Globulin: 22.1 % — ABNORMAL HIGH (ref 11.1–18.8)
M-Spike, %: NOT DETECTED g/dL
Total Protein ELP: 5 g/dL — ABNORMAL LOW (ref 6.0–8.3)

## 2011-09-17 MED ORDER — WHITE PETROLATUM GEL
Status: AC
  Administered 2011-09-17: 21:00:00
  Filled 2011-09-17: qty 5

## 2011-09-17 NOTE — Progress Notes (Signed)
Patient ID: [name], female   DOB: [birth year], 66 y.o.   MRN: [id number] Subjective: 2 Days Post-Op Procedure(s) (LRB): IRRIGATION AND DEBRIDEMENT EXTREMITY (Right)    Patient reports pain as mild.  Remains extubated on face mask BPAP  Objective:   VITALS:   Filed Vitals:   09/17/11 0821  BP:   Pulse:   Temp: 98.5 F (36.9 C)  Resp:     Incision: dressing C/D/I with wound VAC in place, 100cc serosanginous output  LABS  Basename 09/17/11 0500 09/16/11 0500 09/15/11 0430  HGB 8.2* 9.4* 9.2*  HCT 24.8* 28.8* 27.6*  WBC 16.0* 13.5* 14.6*  PLT 209 193 178     Basename 09/17/11 0500 09/16/11 0500 09/15/11 0430  NA 140 141 142  K 4.0 4.0 3.9  BUN 55* 46* 40*  CREATININE 6.46* 5.71* 4.83*  GLUCOSE 84 78 95     Basename 09/15/11 0430 09/14/11 2230  LABPT -- --  INR 1.13 1.09     Assessment/Plan: 2 Days Post-Op Procedure(s) (LRB): IRRIGATION AND DEBRIDEMENT EXTREMITY (Right)  Wound VAC change today May transfer to chair, her right leg will not support any weight

## 2011-09-17 NOTE — Progress Notes (Signed)
ANTIBIOTIC CONSULT NOTE - FOLLOW UP  Pharmacy Consult for ceftazaime Indication: pseudomonas prosthetic joint infection and pneumonia  Allergies  Allergen Reactions  . Coumadin Other (See Comments)    Caused her to have a stroke    Patient Measurements: Height: 5\' 7"  (170.2 cm) Weight: 256 lb 2.8 oz (116.2 kg) IBW/kg (Calculated) : 61.6  Adjusted Body Weight:   Vital Signs: Temp: 98.1 F (36.7 C) (03/04 1234) Temp src: Oral (03/04 1234) BP: 158/97 mmHg (03/04 1300) Pulse Rate: 69  (03/04 1300) Intake/Output from previous day: 03/03 0701 - 03/04 0700 In: 802 [P.O.:350; I.V.:220; IV Piggyback:232] Out: [id number]; Drains:355] Intake/Output from this shift: Total I/O In: 1205 [P.O.:1100; I.V.:55; IV Piggyback:50] Out: 990 [Urine:890; Drains:100]  Labs:  Basename 09/17/11 0500 09/16/11 0500 09/15/11 0430  WBC 16.0* 13.5* 14.6*  HGB 8.2* 9.4* 9.2*  PLT 209 193 178  LABCREA -- -- --  CREATININE 6.46* 5.71* 4.83*   Estimated Creatinine Clearance: 11.7 ml/min (by C-G formula based on Cr of 6.46).  Basename 09/16/11 0500  VANCOTROUGH --  Corlis Leak --  VANCORANDOM 27.2  GENTTROUGH --  GENTPEAK --  GENTRANDOM --  TOBRATROUGH --  [place] --  TOBRARND --  AMIKACINPEAK --  AMIKACINTROU --  AMIKACIN --     Microbiology: Recent Results (from the past 720 hour(s))  SURGICAL PCR SCREEN     Status: Abnormal   Collection Time   08/28/11 10:33 AM      Component Value Range Status Comment   MRSA, PCR NEGATIVE  NEGATIVE  Final    Staphylococcus aureus POSITIVE (*) NEGATIVE  Final   GRAM STAIN     Status: Normal   Collection Time   09/03/11  8:08 AM      Component Value Range Status Comment   Specimen Description FLUID SYNOVIAL RIGHT HIP ON SWAB   Final    Special Requests NONE   Final    Gram Stain     Final    Value: ABUNDANT WBC PRESENT, PREDOMINANTLY PMN     NO ORGANISMS SEEN     Gram Stain Report Called to,Read Back By and Verified With:     DAVENPORT,S.  RN AT [id number] ON 09/03/11 BY GILLESPIE,B.   Report Status 09/03/2011 FINAL   Final   BODY FLUID CULTURE     Status: Normal   Collection Time   09/03/11  8:09 AM      Component Value Range Status Comment   Specimen Description FLUID SYNOVIAL RIGHT HIP ON SWAB   Final    Special Requests NONE   Final    Gram Stain     Final    Value: ABUNDANT WBC PRESENT, PREDOMINANTLY PMN     NO ORGANISMS SEEN     Gram Stain Report Called to,Read Back By and Verified With: Gram Stain Report Called to,Read Back By and Verified With: DAVENPORT S RN @ [phone] ON 09/03/11 BY Ethel Rana B Performed by [facility]   Culture     Final    Value: RARE PSEUDOMONAS AERUGINOSA     Note: CRITICAL RESULT CALLED TO, READ BACK BY AND VERIFIED WITH: KIMBERLY OWENS   Report Status 09/06/2011 FINAL   Final    Organism ID, Bacteria PSEUDOMONAS AERUGINOSA   Final   ANAEROBIC CULTURE     Status: Normal   Collection Time   09/03/11  8:10 AM      Component Value Range Status Comment   Specimen Description FLUID SYNOVIAL RIGHT HIP   Final  Special Requests NONE   Final    Gram Stain     Final    Value: NO WBC SEEN     NO SQUAMOUS EPITHELIAL CELLS SEEN     NO ORGANISMS SEEN   Culture NO ANAEROBES ISOLATED   Final    Report Status 09/07/2011 FINAL   Final   CULTURE, BLOOD (ROUTINE X 2)     Status: Normal   Collection Time   09/10/11  7:55 PM      Component Value Range Status Comment   Specimen Description BLOOD   Final    Special Requests BOTTLES DRAWN AEROBIC AND ANAEROBIC 3CC   Final    Culture  Setup Time [id number]   Final    Culture NO GROWTH 5 DAYS   Final    Report Status 09/17/2011 FINAL   Final   CULTURE, BLOOD (ROUTINE X 2)     Status: Normal   Collection Time   09/10/11  8:00 PM      Component Value Range Status Comment   Specimen Description BLOOD LEFT ARM   Final    Special Requests BOTTLES DRAWN AEROBIC AND ANAEROBIC 4CC   Final    Culture  Setup Time [id number]   Final    Culture NO GROWTH 5 DAYS    Final    Report Status 09/17/2011 FINAL   Final   URINE CULTURE     Status: Normal   Collection Time   09/10/11 10:53 PM      Component Value Range Status Comment   Specimen Description URINE, CATHETERIZED   Final    Special Requests NONE   Final    Culture  Setup Time [id number]   Final    Colony Count NO GROWTH   Final    Culture NO GROWTH   Final    Report Status 09/12/2011 FINAL   Final   MRSA PCR SCREENING     Status: Normal   Collection Time   09/11/11 12:48 AM      Component Value Range Status Comment   MRSA by PCR NEGATIVE  NEGATIVE  Final   CULTURE, RESPIRATORY     Status: Normal   Collection Time   09/12/11  6:53 PM      Component Value Range Status Comment   Specimen Description TRACHEAL ASPIRATE   Final    Special Requests NONE   Final    Gram Stain     Final    Value: NO WBC SEEN     MODERATE SQUAMOUS EPITHELIAL CELLS PRESENT     RARE GRAM POSITIVE COCCI IN PAIRS   Culture Non-Pathogenic Oropharyngeal-type Flora Isolated.   Final    Report Status 09/14/2011 FINAL   Final     Anti-infectives     Start     Dose/Rate Route Frequency Ordered Stop   09/15/11 1110   tobramycin (NEBCIN) powder  Status:  Discontinued          As needed 09/15/11 1117 09/15/11 1135   09/15/11 1110   vancomycin (VANCOCIN) powder  Status:  Discontinued          As needed 09/15/11 1118 09/15/11 1135   09/15/11 1015   vancomycin (VANCOCIN) powder 2,000 mg  Status:  Discontinued        2,000 mg Other To Surgery 09/15/11 1009 09/16/11 1018   09/15/11 1015   tobramycin (NEBCIN) powder 1.2 g  Status:  Discontinued        1.2 g Topical To Surgery 09/15/11  1010 09/16/11 1018   09/15/11 1015   tobramycin (NEBCIN) powder 1.2 g  Status:  Discontinued        1.2 g Topical To Surgery 09/15/11 1010 09/16/11 1018   09/13/11 1100   cefTAZidime (FORTAZ) 2 g in dextrose 5 % 50 mL IVPB        2 g 100 mL/hr over 30 Minutes Intravenous Every 48 hours 09/13/11 1016     09/12/11 2200   levofloxacin  (LEVAQUIN) IVPB 500 mg  Status:  Discontinued        500 mg 100 mL/hr over 60 Minutes Intravenous Every 48 hours 09/11/11 0042 09/17/11 1250   09/12/11 0200   ceFEPIme (MAXIPIME) 1 g in dextrose 5 % 50 mL IVPB  Status:  Discontinued        1 g 100 mL/hr over 30 Minutes Intravenous Every 24 hours 09/11/11 1003 09/13/11 0851   09/11/11 0045   cefTRIAXone (ROCEPHIN) 1 g in dextrose 5 % 50 mL IVPB  Status:  Discontinued        1 g 100 mL/hr over 30 Minutes Intravenous Daily at bedtime 09/11/11 0017 09/11/11 1003   09/10/11 2300   vancomycin (VANCOCIN) 2,000 mg in sodium chloride 0.9 % 500 mL IVPB        2,000 mg 250 mL/hr over 120 Minutes Intravenous  Once 09/10/11 2200 09/11/11 0513   09/10/11 2145   ceFEPIme (MAXIPIME) 1 g in dextrose 5 % 50 mL IVPB        1 g 100 mL/hr over 30 Minutes Intravenous  Once 09/10/11 2138 09/11/11 0241   09/10/11 2145   Levofloxacin (LEVAQUIN) IVPB 750 mg        750 mg 100 mL/hr over 90 Minutes Intravenous  Once 09/10/11 2138 09/10/11 2323          Assessment: 82 YOF s/p recent removal of hip prosthesis and placement of antibiotic spacer.  Cultures grew pan-sensitive P. Aeruginosa.  Discharged on 2/21 and represented 2/25 with confusion from [facility]. Vanco/levofloxacin stopped following 6 days of tx.  Continues of fortaz 1gm IV q48h, renal function worsening, WBC increased   Plan:  1. Continue current fortaz, per ID plan was 8wks of tx from previous admission note.  Change dose to q24h if renal fx improves.   Clovis Riley 09/17/2011,1:57 PM

## 2011-09-17 NOTE — Progress Notes (Signed)
UR completed. [name] 09/17/2011 [phone]

## 2011-09-17 NOTE — Evaluation (Signed)
Occupational Therapy Evaluation Patient Details Name: [name] MRN: [id number] DOB: May 14, 1946 Today's Date: 09/17/2011  Problem List:  Patient Active Problem List  Diagnoses  . TINEA CORPORIS  . Morbid obesity  . DEPRESSION  . WEAKNESS, LEFT SIDE OF BODY  . HYPERTENSION  . Atrial fibrillation  . CVA  . ASTHMA  . RENAL FAILURE, ACUTE  . SEBACEOUS CYST, BREAST  . FOLLICULITIS  . NAUSEA WITH VOMITING  . ABDOMINAL PAIN, UNSPECIFIED SITE  . IATROGENIC CEREBROVASCULAR INFARCT/HEMORRHAGE NE  . ARRHYTHMIA, HX OF  . SINUSITIS- ACUTE-NOS  . Low back pain radiating to right leg  . Anemia  . Hypokalemia  . ARF (acute renal failure)  . Leukocytosis, unspecified  . Clostridium difficile colitis  . Traumatic seroma of thigh  . 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 Acute blood loss anemia  . Acute-on-chronic kidney injury  . Dehydration  . Pneumonia  . Gout  . HCAP (healthcare-associated pneumonia)  . Hypercalcemia    Past Medical History:  Past Medical History  Diagnosis Date  . Hypertension   . Depression   . Fractures, stress     in both feet  . Kidney disease     pt states creatinine is high  . 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 Stroke   . Gout   . Asthma     MILD NOW-NO INHALERS  . Sleep apnea     USES CPAP  . Anemia   . Blood transfusion   . Arthritis     HIP  . Atrial fibrillation     PT TAKES ASPIRIN AS BLOOD THINNER-HX OF CEREBRAL BLEED WHILE ON COUMADIN  . Arrhythmia   . Cardiomyopathy   . Hypercalcemia     09/10/11  . HCAP (healthcare-associated pneumonia)     09/10/11   Past Surgical History:  Past Surgical History  Procedure Date  . Cholecystectomy   . 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 Joint replacement     right hip and right femur x 2  . Right femur fracture x2     FRACTURES WERE AFTER AFTER RIGHT HIP REPLACEMENT    OT Assessment/Plan/Recommendation OT Assessment Clinical  Impression Statement: Pt with RTHA removal and I&D secondary to infection. Pt limited by obesity, PLOF limited secondary to complications and limited weight bearing status. Will benefit from skilled OT in the acute setting to maximize I with ADL and ADL mobility to Mod A to +2total A(pt=70%).  OT Recommendation/Assessment: Patient will need skilled OT in the acute care venue OT Problem List: Decreased strength;Decreased activity tolerance;Impaired balance (sitting and/or standing);Decreased knowledge of use of DME or AE;Decreased knowledge of precautions;Pain OT Therapy Diagnosis : Generalized weakness;Acute pain OT Plan OT Frequency: Min 2X/week OT Treatment/Interventions: Self-care/ADL training;Therapeutic activities;DME and/or AE instruction;Patient/family education OT Recommendation Follow Up Recommendations: Skilled nursing facility Equipment Recommended: Defer to next venue Individuals Consulted Consulted and Agree with Results and Recommendations: Patient OT Goals Acute Rehab OT Goals OT Goal Formulation: With patient Time For Goal Achievement: 2 weeks ADL Goals Pt Will Perform Grooming: with set-up;Sitting at sink;Sitting, edge of bed ADL Goal: Grooming - Progress: Goal set today Pt Will Perform Lower Body Bathing: with min assist;Supine, rolling right and/or left ADL Goal: Lower Body Bathing - Progress: Goal set today Pt Will Perform Lower Body Dressing: with mod assist;Supine, rolling right and/or left ADL Goal: Lower Body Dressing - Progress: Goal set today Pt Will  Transfer to Toilet:  (TBD) Additional ADL Goal #1: Pt will be I with bilateral UE strengthening HEP in prep for ADL mobility. ADL Goal: Additional Goal #1 - Progress: Goal set today  OT Evaluation Precautions/Restrictions  Precautions Precautions: Other (comment) (Per Ortho PA no hip precautions since nothing to dislocate) Precaution Comments: o Restrictions Weight Bearing Restrictions: Yes RLE Weight Bearing:  Touchdown weight bearing Other Position/Activity Restrictions: o Prior Functioning Home Living Lives With: Other (Comment) Receives Help From: Personal care attendant Type of Home: [place] Prior Function Level of Independence: Needs assistance with ADLs Bath: Maximal Toileting: Maximal Dressing: Maximal Driving: No Comments: Pt was ambulatory prior to her first hip surgery last Aug then fell twice and fractured her femur and developed infection. Pt was ambulating minimally grossly 20-30 ft up until Feb with THA removal and non-ambulatory since then.  ADL ADL Eating/Feeding: Simulated;Set up Where Assessed - Eating/Feeding: Bed level Grooming: Simulated;Set up;Supervision/safety Grooming Details (indicate cue type and reason): S for sitting balance Where Assessed - Grooming: Sitting, bed;Unsupported Upper Body Bathing: Simulated;Set up;Supervision/safety Where Assessed - Upper Body Bathing: Sitting, bed;Unsupported Lower Body Bathing: Simulated;+2 Total assistance Lower Body Bathing Details (indicate cue type and reason): +2 sit to stand and to perfrom pericare Where Assessed - Lower Body Bathing: Sit to stand from bed Upper Body Dressing: Simulated;Set up;Supervision/safety Where Assessed - Upper Body Dressing: Sitting, bed;Unsupported Lower Body Dressing: +2 Total assistance Where Assessed - Lower Body Dressing: Sit to stand from bed Toilet Transfer: Not assessed Tub/Shower Transfer: Not assessed Ambulation Related to ADLs: NT ADL Comments: No joint in Rt. hip (only antibiotic spacers at this time). Therefore leg is TDWB with NO hip precautions.  Vision/Perception  Vision - History Patient Visual Report: No change from baseline Cognition Cognition Arousal/Alertness: Awake/alert Overall Cognitive Status: Appears within functional limits for tasks assessed Orientation Level: Oriented X4 Sensation/Coordination Sensation Light Touch: Appears  Intact Coordination Gross Motor Movements are Fluid and Coordinated: Yes Fine Motor Movements are Fluid and Coordinated: Yes Extremity Assessment RUE Assessment RUE Assessment:  (Grossly 4/5) LUE Assessment LUE Assessment:  (Grossly 4/5) Mobility  Bed Mobility Supine to Sit: 1: +2 Total assist;HOB elevated (Comment degrees);With rails;Patient percentage (comment) (pt=30%) Supine to Sit Details (indicate cue type and reason): HOB 35%, Complete assist of RLE to EOB pt able to pivot LLE to EOB and use knee hook to assist with pivot and bil UE on rail to pull to side with assist of pad and VC Sit to Supine: 1: +2 Total assist;HOB flat;Patient percentage (comment) (pt=30%) Sit to Supine - Details (indicate cue type and reason): complete assist of RLE, gravity assisted, cues to sequence and increased time to complete pivot into bed Transfers Sit to Stand: 1: +2 Total assist;From elevated surface;From bed;With upper extremity assist Sit to Stand Details (indicate cue type and reason): elevated bed, use of pad and hips to facilitate standing, and RLE on PT foot to monitor weight bearing. Pt able to anterior weight shift and stand on LLE with bil UE support and cues grossly 10sec at a time. Pt unable to pivot hips at all. x 3 trials Stand to Sit: 1: +2 Total assist;Patient percentage (comment);To bed (pt=60% gravity assisted) Stand to Sit Details: assist to control descent End of Session OT - End of Session Activity Tolerance: Patient tolerated treatment well Patient left: in bed;with call bell in reach;with family/visitor present Nurse Communication: Need for lift equipment;Mobility status for transfers General Behavior During Session: [facility] for tasks performed Cognition:  WFL for tasks performed   Vignesh Willert 09/17/2011, 2:10 PM

## 2011-09-17 NOTE — Progress Notes (Signed)
Called transfer report to [facility], patient and husband made aware of transfer.  VSS, No pain, foley emptied. Elink notified

## 2011-09-17 NOTE — Plan of Care (Signed)
Problem: Phase I Progression Outcomes Goal: Voiding-avoid urinary catheter unless indicated Outcome: Not Met (add Reason) Monitor UOP for Renal/CCM   Problem: Phase II Progression Outcomes Goal: Progress activity as tolerated unless otherwise ordered Outcome: Completed/Met Date Met:  09/17/11 Worked with PT today, Standing beside the bed.  Patient from Rehab facility where she was only able to ambulate up to 30 ft prior to new infection.  Goal: IV changed to normal saline lock Outcome: Progressing MD aware

## 2011-09-17 NOTE — Progress Notes (Signed)
Clinical Social Worker received request to complete Advance Directives with pt.  CSW completed assessment, please see shadow chart for details.  CSW to return tomorrow to assist with notarizing paperwork.  CSW to continue to follow and assist as needed.  Dala Dock, MSW, [place]

## 2011-09-17 NOTE — Consult Note (Signed)
WOC consult Note Reason for Consult: VAC dressing change, assisted PA with ortho at bedside for first post op. Dressing change.  Pt tolerated without problems. Did not need pain meds for dressing.  1 small black granufoam removed per PA, no significant bleeding with dressing. Staples intact for remainder of incision. Wound type:surgical Measurement: 4.0cm x 3.0cm x 8cm deep at 6 oclock Wound bed: beefy red and moist Drainage (amount, consistency, odor) bloody, 100cc noted in VAC canister Periwound:intact Dressing procedure/placement/frequency: NPWT dressings M/W/F, 1pc whole small black granufoam placed into wound bed, drape applied and seal obtained at 127mmHG continuous.   WOC will be available to assist bedside nursing staff with dressing changes, will follow up q M/W/F for this.   Thanks  Chrishaun Sasso [place], [place] [phone])

## 2011-09-17 NOTE — Progress Notes (Signed)
S: Feels good.  Ready to go to stepdown; on nasal oxygen O: Scheduled Medications: (I have reviewed)    . antiseptic oral rinse  15 mL Mouth Rinse QID  . cefTAZidime (FORTAZ)  IV  2 g Intravenous Q48H  . chlorhexidine  15 mL Mouth Rinse BID  . diltiazem  60 mg Oral Q6H  . furosemide  80 mg Intravenous Q6H  . insulin aspart  0-3 Units Subcutaneous Q4H  . levofloxacin (LEVAQUIN) IV  500 mg Intravenous Q48H  . metoprolol tartrate  50 mg Oral BID  . pantoprazole  40 mg Oral Q1200  . predniSONE  30 mg Oral Q breakfast  . sotalol  80 mg Oral Q48H  . DISCONTD: pantoprazole sodium  40 mg Oral Q1200  . DISCONTD: tobramycin  1.2 g Topical To OR  . DISCONTD: tobramycin  1.2 g Topical To OR  . DISCONTD: vancomycin  2,000 mg Other To OR     BP 142/81  Pulse 78  Temp(Src) 98.5 F (36.9 C) (Oral)  Resp 18  Ht 5\' 7"  (1.702 m)  Wt 116.2 kg (256 lb 2.8 oz)  BMI 40.12 kg/m2  SpO2 100%   Intake/Output Summary (Last 24 hours) at 09/17/11 0952 Last data filed at 09/17/11 0800  Gross per 24 hour  Intake    744 ml  Output   1675 ml  Net   -931 ml  On IV lasix 80 Q8H  EXAM:  [place] BF. NAD.   [id number] IJ dialysis catheter in place; left triple lumen; foley draining clear urine CVS:S1S2 no S3 Resp:No increased WOB.  Anteriorly fairly clear [id number] without tenderness Ext:SCD's in place; wound VAC on right hip; no substantial LE edema   Labs: Basic Metabolic Panel:  Lab 0000000 0500 09/16/11 0500 09/15/11 0430 09/13/11 0450  NA 140 141 142 --  K 4.0 4.0 3.9 --  CL 103 105 104 --  CO2 24 25 25  --  GLUCOSE 84 78 95 --  BUN 55* 46* 40* --  CREATININE 6.46* 5.71* 4.83* --  CALCIUM 11.0* 9.9 9.2 --  MG -- -- -- 2.0  PHOS 5.2* 4.9* 4.2 --    Liver Function Tests:  Lab 09/17/11 0500 09/16/11 0500 09/15/11 0430 09/10/11 2000  AST -- -- -- 24  ALT -- -- -- 9  ALKPHOS -- -- -- 103  BILITOT -- -- -- 0.2*  PROT -- -- -- 5.5*  ALBUMIN 1.9* 1.8* 1.9* --   No results found for this  basename: LIPASE:3,AMYLASE:3 in the last 168 hours No results found for this basename: AMMONIA:3 in the last 168 hours  CBC:  Lab 09/17/11 0500 09/16/11 0500 09/15/11 0430 09/14/11 0500 09/13/11 0450 09/10/11 2000  WBC 16.0* 13.5* 14.6* -- -- --  NEUTROABS -- -- -- -- -- 8.8*  HGB 8.2* 9.4* 9.2* -- -- --  HCT 24.8* 28.8* 27.6* -- -- --  MCV 85.8 88.3 86.3 85.1 84.0 --  PLT 209 193 178 -- -- --    Cardiac Enzymes: No results found for this basename: CKTOTAL:5,CKMB:5,CKMBINDEX:5,TROPONINI:5 in the last 168 hours  CBG:  Lab 09/17/11 0817 09/17/11 0359 09/16/11 2338 09/16/11 1930 09/16/11 1526  GLUCAP 86 76 90 95 99   Studies/Results: Dg Chest Port 1 View  09/16/2011  *RADIOLOGY REPORT*  Clinical Data: Respiratory failure  PORTABLE CHEST - 1 VIEW  Comparison:   the previous day's study  Findings: Right IJ central line and left to right arm PICC remain in place.  The patient has  been extubated and the nasogastric tube removed.  Stable cardiomegaly.  Cannot exclude small left pleural effusion.  Slightly improved aeration with concomitant decrease in perihilar and bibasilar atelectasis or interstitial infiltrates.  IMPRESSION:  1.  Extubation with slightly improved aeration.  Original Report Authenticated By: Trecia Rogers, M.D.   Assessment/Plan:  1. Acute on CRF- now non-oliguric (as of yet unclear cause). UA shows mild microhemautria, prot 100. DDx: hypercalcemia, vanco toxicity, post-infectious GN, immune-complex GN due to indolent infection (hip), AIN due to meds, ATN, etc.  Last dialysis was 09/14/2011.  Non-oliguric but creatinine rising in intradialytic interval so unclear if require additional HD or not.  Appears euvolemic.  Will hold lasix and reassess; will not pull temporary dialysis catheter at this time 2. Hypercalcemia- low iPTH, PTH-related peptide pending (I cannot find it). ?due to immobility vs. Paraneoplastic vs iatrogenic. Improved with HD but has drifted back up to 11.   3. S/P recent removal of hardware due to infected THR joint (R) +psuedomonas on multiple antibiotics would not resume vanco due to ARF and elevated trough levels and negative MRSA screen. S/P exploration yesterday per Dr. Alvan Dame with sterile seroma; on fortaz and levaquin 4. HTN  5. Anemia- ABLA and acute illness with CKD at baseline, transfuse as needed; check iron studies 6. CKD baseline creat 2.3, was on 80-160 po lasix bid at home  7. Gout on allopurinol, reduced dose is appropriate  8. Probable PNA, LLL- ?aspiration? on IV abx per primary  9. Volume- improved edema LE's post HD with UF of ~2L with last HD, looks pretty euvolemic; hold lasix 10. VDRF off BiPAP and on nasal O2; anticipate transfer to stepdown per PCCM [place] Anastacio B

## 2011-09-17 NOTE — Progress Notes (Signed)
Name: [name] MRN: [id number] DOB: August 27, 1945    LOS: 7  PCCM PROGRESS NOTE  History of Present Illness: 66 y/o AAF with PMH of HTN, CVA 2/2 ICH, CKD, Gout, Afib (not on coumadin) admitted to [facility] on 2/13 for right hip infection / wound breakdown after right hip arthroplasty.  She underwent I & D on 2/18 and discharge on 2/21 on Xarelto.  Presented to ED on 2/25 with confusion, slurred speech from nursing facility.  Known to have CKD with baseline creatinine of 2.3 (as of 2/21).  Initial cr of 5.1 on admit with AMS, hypercalcemia.  CT head was negative on 2/25.  2/27 afternoon was noted to have significantly altered mental status by [facility] after HD (1.2 L removed) and PCCM consulted.    Lines / Drains: 2/27 OETT>>>3/2 2/27 OGT>>>3/2 2/19 PICC line>>> Rt IJ HD 2/28>>>   Cultures: 2/25 Urine>>>negative 2/25 Blood>>>NGTD>>> 2/27 Sputum >>normal flora   Antibiotics: 2/25 Maxipime>>>off 2/26 Rocephin>> 2/26 2/25 Levaquin>>> 3/4 2/28 vanc>>>off 2/28 Ceftaz>>>  Tests / Events: 2/25 CT HEAD>>>Negative for bleed or other acute intracranial process 2/27 Head CT >>>Stable noncontrast CT appearance the brain without acute intracranial abnormality. 2/28 Ortho called 3/1 remains awake alert, poor output 3/2 to OR for I&D R hip, placement abx beads, removal non purulent seroma 2/28 2D echo>>> EF 50-55%, mild MR, cannot exclude vegetation MV, mod TR, PA press 63mmHg, No obvious vegetation  Overnight Events: No new complaints No distress   OBJECTIVE Vital Signs: Filed Vitals:   09/17/11 1004 09/17/11 1100 09/17/11 1227 09/17/11 1234  BP: 161/78     Pulse: 71 70    Temp:    98.1 F (36.7 C)  TempSrc:    Oral  Resp:  16    Height:      Weight:      SpO2:  100% 97%     Intake/Output Summary (Last 24 hours) at 09/17/11 1258 Last data filed at 09/17/11 1100  Gross per 24 hour  Intake   1229 ml  Output   1315 ml  Net    -86 ml     Physical Examination: General:   Chronically ill in no acute distress Neuro: awake and alert, appropriate, significant gen weakness HEENT:  PERRL, line clean, mm moist Cardiovascular:  RRR, no M/R/G Lungs:  resps even non labored, diminished bases, clear otherwise Abdomen:  Soft, nontender, nondistended, bowel sounds present Musculoskeletal:  Moves all extremities, no pedal edema, R hip with wound vac c/d   Labs and Imaging:  CBC    Component Value Date/Time   WBC 16.0* 09/17/2011 0500   RBC 2.89* 09/17/2011 0500   HGB 8.2* 09/17/2011 0500   HCT 24.8* 09/17/2011 0500   PLT 209 09/17/2011 0500   MCV 85.8 09/17/2011 0500   MCH 28.4 09/17/2011 0500   MCHC 33.1 09/17/2011 0500   RDW 16.5* 09/17/2011 0500   LYMPHSABS 3.1 09/10/2011 2000   MONOABS 0.9 09/10/2011 2000   EOSABS 0.5 09/10/2011 2000   BASOSABS 0.1 09/10/2011 2000    BMET    Component Value Date/Time   NA 140 09/17/2011 0500   K 4.0 09/17/2011 0500   CL 103 09/17/2011 0500   CO2 24 09/17/2011 0500   GLUCOSE 84 09/17/2011 0500   BUN 55* 09/17/2011 0500   CREATININE 6.46* 09/17/2011 0500   CALCIUM 11.0* 09/17/2011 0500   CALCIUM 11.1* 09/11/2011 0825   GFRNONAA 6* 09/17/2011 0500   GFRAA 7* 09/17/2011 0500    No  new CXR  ASSESSMENT AND PLAN  1. Acute on chronic resp failure - Acute component resolved Cont scheduled BDs D/C Prednisone Transfer out of ICU  Encephalopathy-  Resolved    HTN /cardiomyopathy/ Hx Afib- NSR presently PLAN -  Cont lopressor, sotalol home meds     Acute on chronic renal failure - renal following. Unclear etiology.    Lab 09/17/11 0500 09/16/11 0500 09/15/11 0430 09/14/11 0500 09/13/11 0450  CREATININE 6.46* 5.71* 4.83* 5.86* 5.16*5.04*  PLAN   HD per Renal - likely hold until Monday Cont lasix per renal     Anemia -  etiology is not clear.  No overt hemorrhage.  On Xarelto prior to admission.  Lab 09/17/11 0500 09/16/11 0500 09/15/11 0430 09/14/11 0500 09/13/11 0450  HGB 8.2* 9.4* 9.2* 7.1* 7.5*  PLAN -   CBC in AM  Hold Xarelto   holding anticoag for now   SIRS/ Sepsis - Much improved.  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 Also suspected aspiration pneumonia. No obvious vegetation on Echo.  Hx pseudomonas.  Pct negative. To Or 3/2 for washout, did have seroma but non purulent.  VAC placed. Afebrile.    Lab 09/17/11 0500 09/16/11 0500 09/15/11 0430 09/14/11 0500 09/13/11 0450  WBC 16.0* 13.5* 14.6* 13.4* 13.9*  PLAN -  Cont abx - ceftaz, d/c Levofloxacin Follow cultures to completion trend fever, wbc Need to define duration of ceftaz - If ortho does not believe systemix abx are necessary for R hip, would recommend completing 7 days and D/C  R hip prosthesis removal -- per Ortho PLAN -  Will order PT/OT   Will tx to [facility] and to [facility]. Spoke with [facility] NP. PCCM will sign off as of AM 3/5  Merton Border, MD;  PCCM service; [place] [phone]

## 2011-09-17 NOTE — Consult Note (Signed)
WOC note Notified Dr. Alvan Dame that first post op dressing change will need to be changed per surgeon or PA/NP.  Communicated with him while he was in [place], via staff in [place] with him.  Offered to meet him for dressing change today, suggested that his designee could perform as well.  He will determine post op change today either from surgery or his designee.  He did request I perform with bedside nurse I have reiterated new policy that surgery must change all first post op dressings and I will follow up with staff for Wednesday if ortho wishes for WOC to manage VAC after this.  [place], [place]

## 2011-09-17 NOTE — Evaluation (Signed)
Physical Therapy Evaluation Patient Details Name: [name] MRN: [id number] DOB: July 26, 1945 Today's Date: 09/17/2011  Problem List:  Patient Active Problem List  Diagnoses  . TINEA CORPORIS  . Morbid obesity  . DEPRESSION  . WEAKNESS, LEFT SIDE OF BODY  . HYPERTENSION  . Atrial fibrillation  . CVA  . ASTHMA  . RENAL FAILURE, ACUTE  . SEBACEOUS CYST, BREAST  . FOLLICULITIS  . NAUSEA WITH VOMITING  . ABDOMINAL PAIN, UNSPECIFIED SITE  . IATROGENIC CEREBROVASCULAR INFARCT/HEMORRHAGE NE  . ARRHYTHMIA, HX OF  . SINUSITIS- ACUTE-NOS  . Low back pain radiating to right leg  . Anemia  . Hypokalemia  . ARF (acute renal failure)  . Leukocytosis, unspecified  . Clostridium difficile colitis  . Traumatic seroma of thigh  . 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 Acute blood loss anemia  . Acute-on-chronic kidney injury  . Dehydration  . Pneumonia  . Gout  . HCAP (healthcare-associated pneumonia)  . Hypercalcemia    Past Medical History:  Past Medical History  Diagnosis Date  . Hypertension   . Depression   . Fractures, stress     in both feet  . Kidney disease     pt states creatinine is high  . 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 Stroke   . Gout   . Asthma     MILD NOW-NO INHALERS  . Sleep apnea     USES CPAP  . Anemia   . Blood transfusion   . Arthritis     HIP  . Atrial fibrillation     PT TAKES ASPIRIN AS BLOOD THINNER-HX OF CEREBRAL BLEED WHILE ON COUMADIN  . Arrhythmia   . Cardiomyopathy   . Hypercalcemia     09/10/11  . HCAP (healthcare-associated pneumonia)     09/10/11   Past Surgical History:  Past Surgical History  Procedure Date  . Cholecystectomy   . 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 Joint replacement     right hip and right femur x 2  . Right femur fracture x2     FRACTURES WERE AFTER AFTER RIGHT HIP REPLACEMENT    PT Assessment/Plan/Recommendation PT Assessment Clinical Impression  Statement: Pt with RTHA removal and I&D secondary to infection. Pt limited by obesity, PLOF limited secondary to complications and limited weight bearing status. Pt encouraged to perform bilLE and bilUE ROM as much as possible to increase strength to improve transfers. Dgtr present who is an OT and encouraged to perform HEP with pt daily. Will follow acutely to maximize strength and mobility prior to transfer to next venue. PT Recommendation/Assessment: Patient will need skilled PT in the acute care venue PT Problem List: Decreased strength;Decreased range of motion;Decreased mobility;Decreased activity tolerance;Obesity Barriers to Discharge: Other (comment) Barriers to Discharge Comments: Pt plans to return to SNF PT Therapy Diagnosis : Generalized weakness;Difficulty walking PT Plan PT Frequency: Min 2X/week PT Treatment/Interventions: Functional mobility training;Therapeutic exercise;Therapeutic activities;Patient/family education PT Recommendation Follow Up Recommendations: Skilled nursing facility Equipment Recommended: Defer to next venue PT Goals  Acute Rehab PT Goals PT Goal Formulation: With patient/family Time For Goal Achievement: 2 weeks Pt will Roll Supine to Right Side: with mod assist PT Goal: Rolling Supine to Right Side - Progress: Goal set today Pt will Roll Supine to Left Side: with max assist PT Goal: Rolling Supine to Left Side - Progress: Goal set today Pt will go Supine/Side to Sit: with +2  total assist (pt=50%) PT Goal: Supine/Side to Sit - Progress: Goal set today Pt will go Sit to Supine/Side: with +2 total assist (pt=50%) PT Goal: Sit to Supine/Side - Progress: Goal set today Pt will go Sit to Stand: with +2 total assist;from elevated surface (pt=70%) PT Goal: Sit to Stand - Progress: Goal set today Pt will go Stand to Sit: with +2 total assist (pt=60%) PT Goal: Stand to Sit - Progress: Goal set today PT Transfer Goal: Bed to Chair/Chair to Bed - Progress:  Discontinued (comment) Pt will Stand: with +2 total assist;1 - 2 min;with bilateral upper extremity support PT Goal: Stand - Progress: Goal set today  PT Evaluation Precautions/Restrictions  Precautions Precautions: Other (comment) (Per Ortho PA no hip precautions since nothing to dislocate) Precaution Comments: o Restrictions Weight Bearing Restrictions: Yes RLE Weight Bearing: Touchdown weight bearing Other Position/Activity Restrictions: o Prior Functioning  Home Living Lives With: Other (Comment) Receives Help From: Personal care attendant Type of Home: [place] Prior Function Level of Independence: Needs assistance with ADLs Bath: Maximal Toileting: Maximal Dressing: Maximal Driving: No Comments: Pt was ambulatory prior to her first hip surgery last Aug then fell twice and fractured her femur and developed infection. Pt was ambulating minimally grossly 20-30 ft up until Feb with THA removal and non-ambulatory since then.  Cognition Cognition Arousal/Alertness: Awake/alert Overall Cognitive Status: Appears within functional limits for tasks assessed Orientation Level: Oriented X4 Sensation/Coordination Sensation Light Touch: Appears Intact Extremity Assessment RLE Assessment RLE Assessment: Exceptions to [facility] RLE AROM (degrees) RLE Overall AROM Comments: ROM not fully assessed secondary to PA not present for clarification til end of session. Pt grossly 1/5 for quad strength, hip Abdct/Add LLE Assessment LLE Assessment: Exceptions to WFL LLE AROM (degrees) LLE Overall AROM Comments: pt limited by body habitus grossly 2/5 strength Mobility (including Balance) Bed Mobility Supine to Sit: 1: +2 Total assist;HOB elevated (Comment degrees);With rails;Patient percentage (comment) (pt=30%) Supine to Sit Details (indicate cue type and reason): HOB 35%, Complete assist of RLE to EOB pt able to pivot LLE to EOB and use knee hook to assist with pivot and bil UE on  rail to pull to side with assist of pad and VC Sit to Supine: 1: +2 Total assist;HOB flat;Patient percentage (comment) (pt=30%) Sit to Supine - Details (indicate cue type and reason): complete assist of RLE, gravity assisted, cues to sequence and increased time to complete pivot into bed Transfers Sit to Stand: 1: +2 Total assist;From elevated surface;From bed;With upper extremity assist Sit to Stand Details (indicate cue type and reason): elevated bed, use of pad and hips to facilitate standing, and RLE on PT foot to monitor weight bearing. Pt able to anterior weight shift and stand on LLE with bil UE support and cues grossly 10sec at a time. Pt unable to pivot hips at all. x 3 trials Stand to Sit: 1: +2 Total assist;Patient percentage (comment);To bed (pt=60% gravity assisted) Stand to Sit Details: assist to control descent Ambulation/Gait Ambulation/Gait: No Stairs: No Wheelchair Mobility Wheelchair Mobility: No  Posture/Postural Control Posture/Postural Control: Postural limitations Postural Limitations: body habitus, flexed trunk in sitting, initial left lean pt able to correct when cued Balance Balance Assessed: No Exercise    End of Session PT - End of Session Activity Tolerance: Patient tolerated treatment well Patient left: in bed;with call bell in reach;with family/visitor present Nurse Communication: Mobility status for transfers;Need for lift equipment (PA agreeable to use of maxisky for any OOB) General Behavior During Session:  WFL for tasks performed Cognition: [facility] for tasks performed  Melford Aase 09/17/2011, 12:42 PM  Lanetta Inch, [place]

## 2011-09-18 ENCOUNTER — Inpatient Hospital Stay (HOSPITAL_COMMUNITY): Payer: Medicare Other

## 2011-09-18 LAB — CBC
HCT: 23.9 % — ABNORMAL LOW (ref 36.0–46.0)
Hemoglobin: 8.1 g/dL — ABNORMAL LOW (ref 12.0–15.0)
MCH: 29 pg (ref 26.0–34.0)
MCHC: 33.9 g/dL (ref 30.0–36.0)
MCV: 85.7 fL (ref 78.0–100.0)
Platelets: 209 10*3/uL (ref 150–400)
RBC: 2.79 MIL/uL — ABNORMAL LOW (ref 3.87–5.11)
RDW: 16.5 % — ABNORMAL HIGH (ref 11.5–15.5)
WBC: 17.1 10*3/uL — ABNORMAL HIGH (ref 4.0–10.5)

## 2011-09-18 LAB — IRON AND TIBC
Iron: 68 ug/dL (ref 42–135)
Saturation Ratios: 47 % (ref 20–55)
TIBC: 144 ug/dL — ABNORMAL LOW (ref 250–470)
UIBC: 76 ug/dL — ABNORMAL LOW (ref 125–400)

## 2011-09-18 LAB — UIFE/LIGHT CHAINS/TP QN, 24-HR UR
Albumin, U: DETECTED
Alpha 1, Urine: DETECTED — AB
Alpha 2, Urine: DETECTED — AB
Beta, Urine: DETECTED — AB
Free Kappa Lt Chains,Ur: 69.6 mg/dL — ABNORMAL HIGH (ref 0.14–2.42)
Free Kappa/Lambda Ratio: 3.8 ratio (ref 2.04–10.37)
Free Lambda Lt Chains,Ur: 18.3 mg/dL — ABNORMAL HIGH (ref 0.02–0.67)
Gamma Globulin, Urine: DETECTED — AB
Total Protein, Urine: 117.2 mg/dL

## 2011-09-18 LAB — IMMUNOFIXATION ELECTROPHORESIS
IgA: 281 mg/dL (ref 69–380)
IgG (Immunoglobin G), Serum: 1240 mg/dL (ref 690–1700)
IgM, Serum: 64 mg/dL (ref 52–322)
Total Protein ELP: 4.9 g/dL — ABNORMAL LOW (ref 6.0–8.3)

## 2011-09-18 LAB — RENAL FUNCTION PANEL
Albumin: 1.9 g/dL — ABNORMAL LOW (ref 3.5–5.2)
BUN: 64 mg/dL — ABNORMAL HIGH (ref 6–23)
CO2: 24 mEq/L (ref 19–32)
Calcium: 11.7 mg/dL — ABNORMAL HIGH (ref 8.4–10.5)
Chloride: 101 mEq/L (ref 96–112)
Creatinine, Ser: 6.97 mg/dL — ABNORMAL HIGH (ref 0.50–1.10)
GFR calc Af Amer: 7 mL/min — ABNORMAL LOW (ref >90)
GFR calc non Af Amer: 6 mL/min — ABNORMAL LOW (ref >90)
Glucose, Bld: 96 mg/dL (ref 70–99)
Phosphorus: 4.8 mg/dL — ABNORMAL HIGH (ref 2.3–4.6)
Potassium: 3.8 mEq/L (ref 3.5–5.1)
Sodium: 138 mEq/L (ref 135–145)

## 2011-09-18 LAB — GLUCOSE, CAPILLARY
Glucose-Capillary: 119 mg/dL — ABNORMAL HIGH (ref 70–99)
Glucose-Capillary: 90 mg/dL (ref 70–99)
Glucose-Capillary: 79 mg/dL (ref 70–99)
Glucose-Capillary: 70 mg/dL (ref 70–99)

## 2011-09-18 LAB — VITAMIN D 25 HYDROXY (VIT D DEFICIENCY, FRACTURES): Vit D, 25-Hydroxy: <10 ng/mL — ABNORMAL LOW (ref 30–89)

## 2011-09-18 LAB — FERRITIN: Ferritin: 263 ng/mL (ref 10–291)

## 2011-09-18 LAB — PTH-RELATED PEPTIDE: PTH-related peptide: 13 pg/mL — ABNORMAL LOW (ref 14–27)

## 2011-09-18 NOTE — Progress Notes (Signed)
CSW provided clinical documentation to [facility], as pt plans to return to this facility at time of d/c. [facility] CSW will follow to address d/c planning needs as pt progresses.  Jerral Ralph, MSW [phone]

## 2011-09-18 NOTE — Procedures (Signed)
I was present at this session.  I have reviewed the session itself and made appropriate changes.  Mayreli Alden L 3/5/20137:15 PM

## 2011-09-18 NOTE — Progress Notes (Addendum)
Subjective: Interval History:  None other than transfer to floor  Objective: Medications Reviewed:  . cefTAZidime (FORTAZ)  IV  2 g Intravenous Q48H  . diltiazem  60 mg Oral Q6H  . metoprolol tartrate  50 mg Oral BID  . sotalol  80 mg Oral Q48H  . white petrolatum       Vital signs in last 24 hours:  Temp:  [97.8 F (36.6 C)-98.6 F (37 C)] 98.4 F (36.9 C) (03/05 1000) Pulse Rate:  [63-74] 74  (03/05 1000) Resp:  [15-22] 20  (03/05 1000) BP: (119-164)/(60-105) 124/72 mmHg (03/05 1208) SpO2:  [94 %-100 %] 98 % (03/05 1000) Weight:  [113.7 kg (250 lb 10.6 oz)-114.2 kg (251 lb 12.3 oz)] 114.2 kg (251 lb 12.3 oz) (03/04 2045) Weight change:   Intake/Output: I/O last 3 completed shifts: In: [id number] [P.O.:1100; I.V.:215; IV Piggyback:266] Out: 2145 [Urine:2020; Drains:125]  Intake/Output this shift:  Total I/O In: 240 [P.O.:240] Out: 520 [Urine:370; Drains:150]  EXAM: Speaks slowly, some word finding difficulty A little dyspneic Lungs with decreased BS No Rub 1+ edema Lab Results:  Basename 09/18/11 0535 09/17/11 0500 09/16/11 0500  WBC 17.1* 16.0* 13.5*  HGB 8.1* 8.2* 9.4*  HCT 23.9* 24.8* 28.8*  PLT 209 209 193   BMET  Basename 09/18/11 0535 09/17/11 0500 09/16/11 0500  NA 138 140 141  K 3.8 4.0 4.0  CL 101 103 105  CO2 24 24 25   GLUCOSE 96 84 78  BUN 64* 55* 46*  CREATININE 6.97* 6.46* 5.71*  CALCIUM 11.7* 11.0* 9.9  PHOS 4.8* 5.2* 4.9*  PTH 6.7 25 Vit D unmeasurable 1,25 Vit D pending PTH related peptide pending SPEP, UPEP ordered   Basename 09/18/11 0535  PROT --  ALBUMIN 1.9*  AST --  ALT --  ALKPHOS --  BILITOT --  BILIDIR --  IBILI --   PT/INR No results found for this basename: LABPROT:2,INR:2 in the last 72 hours Hepatitis Panel No results found for this basename: HEPBSAG,HCVAB,HEPAIGM,HEPBIGM in the last 72 hours PTH: Lab Results  Component Value Date   PTH 6.7* 09/11/2011   CALCIUM 11.7* 09/18/2011   PHOS 4.8* 09/18/2011     Studies/Results: No results found.  Assessment/Plan: 1. Acute on CRF- now non-oliguric (as of yet unclear cause). UA shows mild microhemautria, prot 100. DDx: hypercalcemia, vanco toxicity, post-infectious GN, immune-complex GN due to indolent infection (hip), AIN due to meds, ATN, etc. Last dialysis was 09/14/2011. Non-oliguric but creatinine rising in intradialytic interval.  Feel we need to HD today.  Have ordered additional studies (ANA, complements, SPEP, UPEP) 2. Hypercalcemia-off Hectoral since admit;  low iPTH, PTH-related peptide pending (I cannot find it). ?due to immobility vs. Paraneoplastic vs iatrogenic (no meds on board, no calcium supplements or binders; no Vitamin D). Improved initially with HD but has drifted back up to 11.7 (corrected close to 13)  PTH and 25 vit D are low, 1,25 is pending and PTH related peptide are also pending; use low calcium bath with HD; check SPEP, UPEP 3. S/P recent removal of hardware due to infected THR joint (R) +psuedomonas on multiple antibiotics would not resume vanco due to ARF and elevated trough levels and negative MRSA screen. S/P exploration  per Dr. Alvan Dame with sterile seroma; on fortaz  4. HTN  5. Anemia- ABLA and acute illness with CKD at baseline, transfuse as needed; check iron studies (ordered but don't see results) 6. CKD baseline creat 2.3, was on 80-160 po lasix bid at home  7. Gout on allopurinol, reduced dose is appropriate  8. Probable PNA, LLL- ?aspiration? on IV abx per primary  9. Volume- improved edema LE's post HD with UF of ~2L with last HD  LOS: 8 Gordon Carlson B @TODAY @2 :32 PM

## 2011-09-18 NOTE — Progress Notes (Signed)
Nutrition Follow-up  PO intake varies from 25-90% of meals, per doc flowsheeets. Pt states, her appetite is getting better but she still can not eat as much as she used to. Pt states PTA, she had a great appetite.  Pt extubated on 3/2. Pt with groin wound. WOC RN following pt for surgical wound.  Pt last received HD on 3/1. HD was held over the weekend. It is unclear if pt will require additional HD or not, per Nephrology note.  Diet Order:  Carb Modified Medium 1600-2000 kcal  Meds: Scheduled Meds:   . cefTAZidime (FORTAZ)  IV  2 g Intravenous Q48H  . diltiazem  60 mg Oral Q6H  . metoprolol tartrate  50 mg Oral BID  . sotalol  80 mg Oral Q48H  . white petrolatum      . DISCONTD: antiseptic oral rinse  15 mL Mouth Rinse QID  . DISCONTD: chlorhexidine  15 mL Mouth Rinse BID  . DISCONTD: insulin aspart  0-3 Units Subcutaneous Q4H  . DISCONTD: levofloxacin (LEVAQUIN) IV  500 mg Intravenous Q48H  . DISCONTD: pantoprazole  40 mg Oral Q1200  . DISCONTD: predniSONE  30 mg Oral Q breakfast   Continuous Infusions:   . sodium chloride Stopped (09/17/11 1700)   PRN Meds:.fentaNYL, heparin, heparin, heparin, heparin, lidocaine, lidocaine-prilocaine, nystatin, pentafluoroprop-tetrafluoroeth, sodium chloride  Labs:  CMP     Component Value Date/Time   NA 138 09/18/2011 0535   K 3.8 09/18/2011 0535   CL 101 09/18/2011 0535   CO2 24 09/18/2011 0535   GLUCOSE 96 09/18/2011 0535   BUN 64* 09/18/2011 0535   CREATININE 6.97* 09/18/2011 0535   CALCIUM 11.7* 09/18/2011 0535   CALCIUM 11.1* 09/11/2011 0825   PROT 5.5* 09/10/2011 2000   ALBUMIN 1.9* 09/18/2011 0535   AST 24 09/10/2011 2000   ALT 9 09/10/2011 2000   ALKPHOS 103 09/10/2011 2000   BILITOT 0.2* 09/10/2011 2000   GFRNONAA 6* 09/18/2011 0535   GFRAA 7* 09/18/2011 0535  Mg: 2.0 2/28 WNL Phosphorus: 4.8 3/5 (high)   Intake/Output Summary (Last 24 hours) at 09/18/11 1109 Last data filed at 09/18/11 1000  Gross per 24 hour  Intake    800 ml  Output    1560 ml  Net   -760 ml    Weight Status:  114.2 kg, trending down from 124.7 kg on 2/28 (accuracy of 2/28 weight is questionable since most other weights have been between 117.9 and 114.2 kg) Wt Readings from Last 4 Encounters:  09/17/11 251 lb 12.3 oz (114.2 kg)  09/17/11 251 lb 12.3 oz (114.2 kg)  09/03/11 260 lb (117.935 kg)  09/03/11 260 lb (117.935 kg)   Re-estimated needs (based on non-HD needs):  1900-2100 kcal, 85 - 95 grams protein  Nutrition Dx:  Inadequate oral intake now r/t poor appetite AEB pt report, PO intake <75% of meals, previous NPO status  Goal: PO intake to meet >/=90% of estimated nutrition needs.  Intervention:   If PO intake falls <50%, will order nutrition supplement   If pt needs HD, will educate pt on HD diet.  Monitor:  PO intake, weight trends, need for HD, labs, I/O's, wound care   [name] Pager #:  [phone]  Asencion Partridge Pager#: [phone]

## 2011-09-18 NOTE — Progress Notes (Signed)
Subjective: Patient seen and examined, no complaints today.  Objective: Vital signs in last 24 hours: Temp:  [98.1 F (36.7 C)-98.6 F (37 C)] 98.1 F (36.7 C) (03/05 1400) Pulse Rate:  [63-74] 63  (03/05 1400) Resp:  [18-22] 18  (03/05 1400) BP: (119-164)/(44-85) 123/44 mmHg (03/05 1400) SpO2:  [94 %-98 %] 96 % (03/05 1400) Weight:  [113.7 kg (250 lb 10.6 oz)-114.2 kg (251 lb 12.3 oz)] 114.2 kg (251 lb 12.3 oz) (03/04 2045) Weight change:  Last BM Date: 09/10/11  Intake/Output from previous day: 03/04 0701 - 03/05 0700 In: 1245 [P.O.:1100; I.V.:95; IV Piggyback:50] Out: [id number]; Drains:100] Total I/O In: 480 [P.O.:480] Out: 520 [Urine:370; Drains:150]   Physical Exam: HEENT: Atraumatic, normocephalic Neck: Supple Chest : Clear to auscultation bilaterally, no wheezing, no crackles Heart : S1 S2 Regular, no murmurs Abdomen: Soft, Nontender, no organomegaly Ext : No cyanosis, clubbing, edema   Lab Results: Basic Metabolic Panel:  Basename 09/18/11 0535 09/17/11 0500  NA 138 140  K 3.8 4.0  CL 101 103  CO2 24 24  GLUCOSE 96 84  BUN 64* 55*  CREATININE 6.97* 6.46*  CALCIUM 11.7* 11.0*  MG -- --  PHOS 4.8* 5.2*   Liver Function Tests:  Basename 09/18/11 0535 09/17/11 0500  AST -- --  ALT -- --  ALKPHOS -- --  BILITOT -- --  PROT -- --  ALBUMIN 1.9* 1.9*   No results found for this basename: LIPASE:2,AMYLASE:2 in the last 72 hours No results found for this basename: AMMONIA:2 in the last 72 hours CBC:  Basename 09/18/11 0535 09/17/11 0500  WBC 17.1* 16.0*  NEUTROABS -- --  HGB 8.1* 8.2*  HCT 23.9* 24.8*  MCV 85.7 85.8  PLT 209 209   Cardiac Enzymes: No results found for this basename: CKTOTAL:3,CKMB:3,CKMBINDEX:3,TROPONINI:3 in the last 72 hours BNP: No results found for this basename: PROBNP:3 in the last 72 hours D-Dimer: No results found for this basename: DDIMER:2 in the last 72 hours CBG:  Basename 09/18/11 0755 09/18/11 0415  09/17/11 2143 09/17/11 1723 09/17/11 1555 09/17/11 1233  GLUCAP 90 119* 126* 140* 109* 98   Hemoglobin A1C: No results found for this basename: HGBA1C in the last 72 hours Fasting Lipid Panel: No results found for this basename: CHOL,HDL,LDLCALC,TRIG,CHOLHDL,LDLDIRECT in the last 72 hours Thyroid Function Tests: No results found for this basename: TSH,T4TOTAL,FREET4,T3FREE,THYROIDAB in the last 72 hours Anemia Panel:  Basename 09/18/11 0535  VITAMINB12 --  FOLATE --  FERRITIN 263  TIBC 144*  IRON 68  RETICCTPCT --   Coagulation: No results found for this basename: LABPROT:2,INR:2 in the last 72 hours Urine Drug Screen: Drugs of Abuse  No results found for this basename: labopia, cocainscrnur, labbenz, amphetmu, thcu, labbarb    Alcohol Level: No results found for this basename: ETH:2 in the last 72 hours Urinalysis: No results found for this basename: COLORURINE:2,APPERANCEUR:2,LABSPEC:2,PHURINE:2,GLUCOSEU:2,HGBUR:2,BILIRUBINUR:2,KETONESUR:2,PROTEINUR:2,UROBILINOGEN:2,NITRITE:2,LEUKOCYTESUR:2 in the last 72 hours Misc. Labs:  Recent Results (from the past 240 hour(s))  CULTURE, BLOOD (ROUTINE X 2)     Status: Normal   Collection Time   09/10/11  7:55 PM      Component Value Range Status Comment   Specimen Description BLOOD   Final    Special Requests BOTTLES DRAWN AEROBIC AND ANAEROBIC [facility]   Final    Culture  Setup Time [id number]   Final    Culture NO GROWTH 5 DAYS   Final    Report Status 09/17/2011 FINAL   Final   CULTURE, BLOOD (  ROUTINE X 2)     Status: Normal   Collection Time   09/10/11  8:00 PM      Component Value Range Status Comment   Specimen Description BLOOD LEFT ARM   Final    Special Requests BOTTLES DRAWN AEROBIC AND ANAEROBIC 4CC   Final    Culture  Setup Time [id number]   Final    Culture NO GROWTH 5 DAYS   Final    Report Status 09/17/2011 FINAL   Final   URINE CULTURE     Status: Normal   Collection Time   09/10/11 10:53 PM      Component  Value Range Status Comment   Specimen Description URINE, CATHETERIZED   Final    Special Requests NONE   Final    Culture  Setup Time [id number]   Final    Colony Count NO GROWTH   Final    Culture NO GROWTH   Final    Report Status 09/12/2011 FINAL   Final   MRSA PCR SCREENING     Status: Normal   Collection Time   09/11/11 12:48 AM      Component Value Range Status Comment   MRSA by PCR NEGATIVE  NEGATIVE  Final   CULTURE, RESPIRATORY     Status: Normal   Collection Time   09/12/11  6:53 PM      Component Value Range Status Comment   Specimen Description TRACHEAL ASPIRATE   Final    Special Requests NONE   Final    Gram Stain     Final    Value: NO WBC SEEN     MODERATE SQUAMOUS EPITHELIAL CELLS PRESENT     RARE GRAM POSITIVE COCCI IN PAIRS   Culture Non-Pathogenic Oropharyngeal-type Flora Isolated.   Final    Report Status 09/14/2011 FINAL   Final     Studies/Results: No results found.  Medications: Scheduled Meds:   . cefTAZidime (FORTAZ)  IV  2 g Intravenous Q48H  . diltiazem  60 mg Oral Q6H  . metoprolol tartrate  50 mg Oral BID  . sotalol  80 mg Oral Q48H  . white petrolatum       Continuous Infusions:   . sodium chloride Stopped (09/17/11 1700)   PRN Meds:.fentaNYL, heparin, heparin, heparin, heparin, lidocaine, lidocaine-prilocaine, nystatin, pentafluoroprop-tetrafluoroeth, sodium chloride  Assessment/P   Acute on chronic Resp failure; Resolved. Off Prednisone  Encephalopathy Resolved  Sepsis Resolved Sec tp infected right hip prosthesis, s/p removal of prosthesis On ceftazidime ID consulted.  Right Hip prosthesis removal Ortho following  Acute renal failure Sec to vancomycin Nephro following On Hemodialysis   Atrial Fibrillation Continue cardizem, sotalol, Metoprolol        LOS: 8 days   [facility] Pager: [phone] 09/18/2011, 5:03 PM

## 2011-09-19 ENCOUNTER — Encounter (HOSPITAL_COMMUNITY): Payer: Self-pay | Admitting: Orthopedic Surgery

## 2011-09-19 DIAGNOSIS — A0472 Enterocolitis due to Clostridium difficile, not specified as recurrent: Secondary | ICD-10-CM

## 2011-09-19 LAB — GLUCOSE, CAPILLARY
Glucose-Capillary: 101 mg/dL — ABNORMAL HIGH (ref 70–99)
Glucose-Capillary: 89 mg/dL (ref 70–99)
Glucose-Capillary: 90 mg/dL (ref 70–99)

## 2011-09-19 LAB — CBC
HCT: 24.8 % — ABNORMAL LOW (ref 36.0–46.0)
HCT: 25.1 % — ABNORMAL LOW (ref 36.0–46.0)
Hemoglobin: 8.3 g/dL — ABNORMAL LOW (ref 12.0–15.0)
Hemoglobin: 8.4 g/dL — ABNORMAL LOW (ref 12.0–15.0)
MCH: 28.3 pg (ref 26.0–34.0)
MCH: 28.2 pg (ref 26.0–34.0)
MCHC: 33.5 g/dL (ref 30.0–36.0)
MCHC: 33.5 g/dL (ref 30.0–36.0)
MCV: 84.6 fL (ref 78.0–100.0)
MCV: 84.2 fL (ref 78.0–100.0)
Platelets: 200 10*3/uL (ref 150–400)
Platelets: 197 10*3/uL (ref 150–400)
RBC: 2.93 MIL/uL — ABNORMAL LOW (ref 3.87–5.11)
RBC: 2.98 MIL/uL — ABNORMAL LOW (ref 3.87–5.11)
RDW: 16.5 % — ABNORMAL HIGH (ref 11.5–15.5)
RDW: 16.6 % — ABNORMAL HIGH (ref 11.5–15.5)
WBC: 14 10*3/uL — ABNORMAL HIGH (ref 4.0–10.5)
WBC: 14.8 10*3/uL — ABNORMAL HIGH (ref 4.0–10.5)

## 2011-09-19 LAB — DIFFERENTIAL
Basophils Absolute: 0.1 10*3/uL (ref 0.0–0.1)
Basophils Relative: 1 % (ref 0–1)
Eosinophils Absolute: 0.3 10*3/uL (ref 0.0–0.7)
Eosinophils Relative: 2 % (ref 0–5)
Lymphocytes Relative: 18 % (ref 12–46)
Lymphs Abs: 2.7 10*3/uL (ref 0.7–4.0)
Monocytes Absolute: 2.4 10*3/uL — ABNORMAL HIGH (ref 0.1–1.0)
Monocytes Relative: 16 % — ABNORMAL HIGH (ref 3–12)
Neutro Abs: 9.3 10*3/uL — ABNORMAL HIGH (ref 1.7–7.7)
Neutrophils Relative %: 63 % (ref 43–77)

## 2011-09-19 LAB — RENAL FUNCTION PANEL
Albumin: 1.9 g/dL — ABNORMAL LOW (ref 3.5–5.2)
BUN: 27 mg/dL — ABNORMAL HIGH (ref 6–23)
CO2: 27 mEq/L (ref 19–32)
Calcium: 10.6 mg/dL — ABNORMAL HIGH (ref 8.4–10.5)
Chloride: 102 mEq/L (ref 96–112)
Creatinine, Ser: 3.84 mg/dL — ABNORMAL HIGH (ref 0.50–1.10)
GFR calc Af Amer: 13 mL/min — ABNORMAL LOW (ref >90)
GFR calc non Af Amer: 12 mL/min — ABNORMAL LOW (ref >90)
Glucose, Bld: 86 mg/dL (ref 70–99)
Phosphorus: 2.9 mg/dL (ref 2.3–4.6)
Potassium: 3.7 mEq/L (ref 3.5–5.1)
Sodium: 138 mEq/L (ref 135–145)

## 2011-09-19 LAB — ANA: Anti Nuclear Antibody(ANA): POSITIVE — AB

## 2011-09-19 LAB — ANTI-NUCLEAR AB-TITER (ANA TITER): ANA Titer 1: 1:40 {titer} — ABNORMAL HIGH

## 2011-09-19 LAB — C3 COMPLEMENT: C3 Complement: 83 mg/dL — ABNORMAL LOW (ref 90–180)

## 2011-09-19 LAB — C4 COMPLEMENT: Complement C4, Body Fluid: 14 mg/dL — ABNORMAL LOW (ref 10–40)

## 2011-09-19 MED ORDER — DEXTROSE 5 % IV SOLN: 2.0000 g | INTRAVENOUS | Status: DC

## 2011-09-19 MED ORDER — DEXTROSE 5 % IV SOLN
2.0000 g | INTRAVENOUS | Status: DC
  Administered 2011-09-21: 2 g via INTRAVENOUS
  Filled 2011-09-19: qty 2

## 2011-09-19 NOTE — Progress Notes (Addendum)
ANTIBIOTIC CONSULT NOTE - FOLLOW UP  Pharmacy Consult for ceftazaime Indication: pseudomonas prosthetic joint infection and pneumonia  Allergies  Allergen Reactions  . Coumadin Other (See Comments)    Caused her to have a stroke    Patient Measurements: Height: 5\' 5"  (165.1 cm) Weight: 244 lb 7.8 oz (110.9 kg) IBW/kg (Calculated) : 57  Adjusted Body Weight:   Vital Signs: Temp: 97.9 F (36.6 C) (03/06 0444) Temp src: Oral (03/06 0444) BP: 137/64 mmHg (03/06 1038) Pulse Rate: 79  (03/06 1038) Intake/Output from previous day: 03/05 0701 - 03/06 0700 In: 600 [P.O.:600] Out: 3510 [Urine:1070; Drains:320] Intake/Output from this shift:    Labs:  Basename 09/19/11 0518 09/18/11 0535 09/17/11 0500  WBC 14.0* 17.1* 16.0*  HGB 8.3* 8.1* 8.2*  PLT 200 209 209  LABCREA -- -- --  CREATININE 3.84* 6.97* 6.46*   Estimated Creatinine Clearance: 18.6 ml/min (by C-G formula based on Cr of 3.84). No results found for this basename: VANCOTROUGH:2,VANCOPEAK:2,VANCORANDOM:2,GENTTROUGH:2,GENTPEAK:2,GENTRANDOM:2,TOBRATROUGH:2,TOBRAPEAK:2,TOBRARND:2,AMIKACINPEAK:2,AMIKACINTROU:2,AMIKACIN:2, in the last 72 hours   Microbiology: Recent Results (from the past 720 hour(s))  SURGICAL PCR SCREEN     Status: Abnormal   Collection Time   08/28/11 10:33 AM      Component Value Range Status Comment   MRSA, PCR NEGATIVE  NEGATIVE  Final    Staphylococcus aureus POSITIVE (*) NEGATIVE  Final   GRAM STAIN     Status: Normal   Collection Time   09/03/11  8:08 AM      Component Value Range Status Comment   Specimen Description FLUID SYNOVIAL RIGHT HIP ON SWAB   Final    Special Requests NONE   Final    Gram Stain     Final    Value: ABUNDANT WBC PRESENT, PREDOMINANTLY PMN     NO ORGANISMS SEEN     Gram Stain Report Called to,Read Back By and Verified With:     DAVENPORT,S. RN AT [id number] ON 09/03/11 BY GILLESPIE,B.   Report Status 09/03/2011 FINAL   Final   BODY FLUID CULTURE     Status: Normal   Collection Time   09/03/11  8:09 AM      Component Value Range Status Comment   Specimen Description FLUID SYNOVIAL RIGHT HIP ON SWAB   Final    Special Requests NONE   Final    Gram Stain     Final    Value: ABUNDANT WBC PRESENT, PREDOMINANTLY PMN     NO ORGANISMS SEEN     Gram Stain Report Called to,Read Back By and Verified With: Gram Stain Report Called to,Read Back By and Verified With: DAVENPORT S RN @ [phone] ON 09/03/11 BY Ethel Rana B Performed by [facility]   Culture     Final    Value: RARE PSEUDOMONAS AERUGINOSA     Note: CRITICAL RESULT CALLED TO, READ BACK BY AND VERIFIED WITH: KIMBERLY OWENS   Report Status 09/06/2011 FINAL   Final    Organism ID, Bacteria PSEUDOMONAS AERUGINOSA   Final   ANAEROBIC CULTURE     Status: Normal   Collection Time   09/03/11  8:10 AM      Component Value Range Status Comment   Specimen Description FLUID SYNOVIAL RIGHT HIP   Final    Special Requests NONE   Final    Gram Stain     Final    Value: NO WBC SEEN     NO SQUAMOUS EPITHELIAL CELLS SEEN     NO ORGANISMS SEEN  Culture NO ANAEROBES ISOLATED   Final    Report Status 09/07/2011 FINAL   Final   CULTURE, BLOOD (ROUTINE X 2)     Status: Normal   Collection Time   09/10/11  7:55 PM      Component Value Range Status Comment   Specimen Description BLOOD   Final    Special Requests BOTTLES DRAWN AEROBIC AND ANAEROBIC 3CC   Final    Culture  Setup Time [id number]   Final    Culture NO GROWTH 5 DAYS   Final    Report Status 09/17/2011 FINAL   Final   CULTURE, BLOOD (ROUTINE X 2)     Status: Normal   Collection Time   09/10/11  8:00 PM      Component Value Range Status Comment   Specimen Description BLOOD LEFT ARM   Final    Special Requests BOTTLES DRAWN AEROBIC AND ANAEROBIC 4CC   Final    Culture  Setup Time [id number]   Final    Culture NO GROWTH 5 DAYS   Final    Report Status 09/17/2011 FINAL   Final   URINE CULTURE     Status: Normal   Collection Time   09/10/11 10:53 PM        Component Value Range Status Comment   Specimen Description URINE, CATHETERIZED   Final    Special Requests NONE   Final    Culture  Setup Time [id number]   Final    Colony Count NO GROWTH   Final    Culture NO GROWTH   Final    Report Status 09/12/2011 FINAL   Final   MRSA PCR SCREENING     Status: Normal   Collection Time   09/11/11 12:48 AM      Component Value Range Status Comment   MRSA by PCR NEGATIVE  NEGATIVE  Final   CULTURE, RESPIRATORY     Status: Normal   Collection Time   09/12/11  6:53 PM      Component Value Range Status Comment   Specimen Description TRACHEAL ASPIRATE   Final    Special Requests NONE   Final    Gram Stain     Final    Value: NO WBC SEEN     MODERATE SQUAMOUS EPITHELIAL CELLS PRESENT     RARE GRAM POSITIVE COCCI IN PAIRS   Culture Non-Pathogenic Oropharyngeal-type Flora Isolated.   Final    Report Status 09/14/2011 FINAL   Final     Anti-infectives     Start     Dose/Rate Route Frequency Ordered Stop   09/15/11 1110   tobramycin (NEBCIN) powder  Status:  Discontinued          As needed 09/15/11 1117 09/15/11 1135   09/15/11 1110   vancomycin (VANCOCIN) powder  Status:  Discontinued          As needed 09/15/11 1118 09/15/11 1135   09/15/11 1015   vancomycin (VANCOCIN) powder 2,000 mg  Status:  Discontinued        2,000 mg Other To Surgery 09/15/11 1009 09/16/11 1018   09/15/11 1015   tobramycin (NEBCIN) powder 1.2 g  Status:  Discontinued        1.2 g Topical To Surgery 09/15/11 1010 09/16/11 1018   09/15/11 1015   tobramycin (NEBCIN) powder 1.2 g  Status:  Discontinued        1.2 g Topical To Surgery 09/15/11 1010 09/16/11 1018   09/13/11 1100  cefTAZidime (FORTAZ) 2 g in dextrose 5 % 50 mL IVPB        2 g 100 mL/hr over 30 Minutes Intravenous Every 48 hours 09/13/11 1016     09/12/11 2200   levofloxacin (LEVAQUIN) IVPB 500 mg  Status:  Discontinued        500 mg 100 mL/hr over 60 Minutes Intravenous Every 48 hours 09/11/11 0042  09/17/11 1250   09/12/11 0200   ceFEPIme (MAXIPIME) 1 g in dextrose 5 % 50 mL IVPB  Status:  Discontinued        1 g 100 mL/hr over 30 Minutes Intravenous Every 24 hours 09/11/11 1003 09/13/11 0851   09/11/11 0045   cefTRIAXone (ROCEPHIN) 1 g in dextrose 5 % 50 mL IVPB  Status:  Discontinued        1 g 100 mL/hr over 30 Minutes Intravenous Daily at bedtime 09/11/11 0017 09/11/11 1003   09/10/11 2300   vancomycin (VANCOCIN) 2,000 mg in sodium chloride 0.9 % 500 mL IVPB        2,000 mg 250 mL/hr over 120 Minutes Intravenous  Once 09/10/11 2200 09/11/11 0513   09/10/11 2145   ceFEPIme (MAXIPIME) 1 g in dextrose 5 % 50 mL IVPB        1 g 100 mL/hr over 30 Minutes Intravenous  Once 09/10/11 2138 09/11/11 0241   09/10/11 2145   Levofloxacin (LEVAQUIN) IVPB 750 mg        750 mg 100 mL/hr over 90 Minutes Intravenous  Once 09/10/11 2138 09/10/11 2323          Assessment: 74 YOF s/p recent removal of hip prosthesis and placement of antibiotic spacer.  Cultures grew pan-sensitive P. Aeruginosa.  Discharged on 2/21 and represented 2/25 with confusion from [facility]. Vanco/levofloxacin stopped following 6 days of tx.  Continues of fortaz 1gm IV q48h (Day #7). Pt is afebrile, WBC elevated but trending down (14.0<<17.1). UOP over last 24h: 0.41ml/kg/hr and scr improved s/p dialysis 3.84<<6.97. Unclear if this improvement in scr is transient s/p dialysis. Will continue to monitor and f/u HD plans with renal. Will continue current regimen.   Plan:  1. Continue Fortaz to 2 gm IV q48hrs, per ID plan was 8wks of tx from previous admission note.  Cont to monitor renal fx, HD plan, WBC, and tx plans  Gerrit Halls, PharmD [phone] 09/19/2011,11:48 AM

## 2011-09-19 NOTE — Progress Notes (Signed)
Patient discussed at the Long Length of Stay Annabell Sabal [facility] 09/19/2011

## 2011-09-19 NOTE — Progress Notes (Signed)
Placed Patient on CPAP autotitration QHS.  Patient tolerating CPAP well.

## 2011-09-19 NOTE — Progress Notes (Signed)
PROGRESS NOTE  [name] [id number] DOB: Aug 23, 1945 DOA: 09/10/2011 PCP: Garnet Koyanagi, DO, DO  Brief narrative: 66 yo female admit 2/21 with confusion/AMS and found to be in AKI with creat 5.1 (after 2/13 admit for R hip infection breakdown)  Past medical history: Depression, stress fractures both feet, intracerebral hemorrhage 2009, gout, asthma, sleep apnea, anemia, arthritis, atrial fibrillation [not on Coumadin], cardiomyopathy  Consultants:  Pulmonary critical care  Nephrology  Infectious disease  Procedures: 2/25 CT HEAD>>>Negative for bleed or other acute intracranial process  2/27 Head CT >>>Stable noncontrast CT appearance the brain without acute intracranial abnormality.  2/28 Ortho called  3/1 remains awake alert, poor output  3/2 to OR for I&D R hip, placement abx beads, removal non purulent seroma  2/28 2D echo>>> EF 50-55%, mild MR, cannot exclude vegetation MV, mod TR, PA press 28mmHg, No obvious vegetation   Antibiotics: 2/25 Maxipime>>>off  2/26 Rocephin>> 2/26  2/25 Levaquin>>> 3/4  2/28 vanc>>>off  2/28 Ceftaz>>>    Subjective  Feels well. No chest pain no shortness breath no nausea no vomiting. Family in room.   Objective    Interim History: All notes reviewed in detail.  Objective: Filed Vitals:   09/18/11 2100 09/18/11 2124 09/19/11 0444 09/19/11 1038  BP: 134/98 155/84 152/77 137/64  Pulse: 71 71 69 79  Temp:  98.2 F (36.8 C) 97.9 F (36.6 C)   TempSrc:  Oral Oral   Resp: 20 20 20    Height:      Weight: 112.2 kg (247 lb 5.7 oz) 110.9 kg (244 lb 7.8 oz)    SpO2: 96% 98% 84%     Intake/Output Summary (Last 24 hours) at 09/19/11 1727 Last data filed at 09/19/11 1500  Gross per 24 hour  Intake    120 ml  Output   2991 ml  Net  -2871 ml    Exam:  General: Pleasant African American female, frail appearing but smiling Cardiovascular: S1-S2 no murmur rub or gallop. Slightly irregular rhythm Respiratory: Clinically  clear Abdomen: Soft nontender nondistended Skin intact with a drain on the right thigh and some swelling right thigh right leg Neuro grossly intact  Data Reviewed: Basic Metabolic Panel:  Lab 0000000 0518 09/18/11 0535 09/17/11 0500 09/16/11 0500 09/15/11 0430 09/13/11 0450  NA 138 138 140 141 142 --  K 3.7 3.8 -- -- -- --  CL 102 101 103 105 104 --  CO2 27 24 24 25 25  --  GLUCOSE 86 96 84 78 95 --  BUN 27* 64* 55* 46* 40* --  CREATININE 3.84* 6.97* 6.46* 5.71* 4.83* --  CALCIUM 10.6* 11.7* 11.0* 9.9 9.2 --  MG -- -- -- -- -- 2.0  PHOS 2.9 4.8* 5.2* 4.9* 4.2 --   Liver Function Tests:  Lab 09/19/11 0518 09/18/11 0535 09/17/11 0500 09/16/11 0500 09/15/11 0430  AST -- -- -- -- --  ALT -- -- -- -- --  ALKPHOS -- -- -- -- --  BILITOT -- -- -- -- --  PROT -- -- -- -- --  ALBUMIN 1.9* 1.9* 1.9* 1.8* 1.9*   No results found for this basename: LIPASE:5,AMYLASE:5 in the last 168 hours No results found for this basename: AMMONIA:5 in the last 168 hours CBC:  Lab 09/19/11 0518 09/18/11 0535 09/17/11 0500 09/16/11 0500 09/15/11 0430  WBC 14.0* 17.1* 16.0* 13.5* 14.6*  NEUTROABS -- -- -- -- --  HGB 8.3* 8.1* 8.2* 9.4* 9.2*  HCT 24.8* 23.9* 24.8* 28.8* 27.6*  MCV 84.6  85.7 85.8 88.3 86.3  PLT 200 209 209 193 178   Cardiac Enzymes: No results found for this basename: CKTOTAL:5,CKMB:5,CKMBINDEX:5,TROPONINI:5 in the last 168 hours BNP: No components found with this basename: POCBNP:5 CBG:  Lab 09/19/11 1212 09/19/11 0742 09/19/11 0005 09/18/11 2120 09/18/11 1635  GLUCAP 101* 90 89 70 79    Recent Results (from the past 240 hour(s))  CULTURE, BLOOD (ROUTINE X 2)     Status: Normal   Collection Time   09/10/11  7:55 PM      Component Value Range Status Comment   Specimen Description BLOOD   Final    Special Requests BOTTLES DRAWN AEROBIC AND ANAEROBIC 3CC   Final    Culture  Setup Time [id number]   Final    Culture NO GROWTH 5 DAYS   Final    Report Status 09/17/2011 FINAL    Final   CULTURE, BLOOD (ROUTINE X 2)     Status: Normal   Collection Time   09/10/11  8:00 PM      Component Value Range Status Comment   Specimen Description BLOOD LEFT ARM   Final    Special Requests BOTTLES DRAWN AEROBIC AND ANAEROBIC 4CC   Final    Culture  Setup Time [id number]   Final    Culture NO GROWTH 5 DAYS   Final    Report Status 09/17/2011 FINAL   Final   URINE CULTURE     Status: Normal   Collection Time   09/10/11 10:53 PM      Component Value Range Status Comment   Specimen Description URINE, CATHETERIZED   Final    Special Requests NONE   Final    Culture  Setup Time [id number]   Final    Colony Count NO GROWTH   Final    Culture NO GROWTH   Final    Report Status 09/12/2011 FINAL   Final   MRSA PCR SCREENING     Status: Normal   Collection Time   09/11/11 12:48 AM      Component Value Range Status Comment   MRSA by PCR NEGATIVE  NEGATIVE  Final   CULTURE, RESPIRATORY     Status: Normal   Collection Time   09/12/11  6:53 PM      Component Value Range Status Comment   Specimen Description TRACHEAL ASPIRATE   Final    Special Requests NONE   Final    Gram Stain     Final    Value: NO WBC SEEN     MODERATE SQUAMOUS EPITHELIAL CELLS PRESENT     RARE GRAM POSITIVE COCCI IN PAIRS   Culture Non-Pathogenic Oropharyngeal-type Flora Isolated.   Final    Report Status 09/14/2011 FINAL   Final      Studies: Dg Chest 1 View  09/10/2011  *RADIOLOGY REPORT*  Clinical Data: Altered mental status.  CHEST - 1 VIEW  Comparison: 09/04/2011  Findings: The patient has a right-sided PICC line, tip to the superior vena cava.  The heart is enlarged.  There is dense opacity at the left lung base which partially obscures the lateral hemidiaphragm.  Findings are consistent with infectious infiltrate. No evidence for pulmonary edema.  No evidence for pneumothorax. There is degenerative change in the spine.  IMPRESSION:  1.  Cardiomegaly without pulmonary edema. 2.  Left lower lobe  infiltrate.  Original Report Authenticated By: Glenice Bow, M.D.   Ct Head Wo Contrast  09/12/2011  *RADIOLOGY REPORT*  Clinical Data:  66 year old female with decreased level of consciousness.  CT HEAD WITHOUT CONTRAST  Technique:  Contiguous axial images were obtained from the base of the skull through the vertex without contrast.  Comparison: 09/10/2011 and earlier.  Findings: Mild bubbly opacity in the right sphenoid.  Other Visualized paranasal sinuses and mastoids are clear.  The patient is intubated.  No acute scalp or orbit soft tissue findings. No acute osseous abnormality identified.  Stable cerebral volume.  No ventriculomegaly. Stable gray-white matter differentiation throughout the brain.  No midline shift, mass effect, or evidence of mass lesion.  No acute intracranial hemorrhage identified.  No evidence of cortically based acute infarction identified.  No suspicious intracranial vascular hyperdensity.  IMPRESSION: 1.  Stable noncontrast CT appearance the brain without acute intracranial abnormality. 2.  Mild paranasal sinus inflammatory changes in the setting of intubation.  Original Report Authenticated By: Randall An, M.D.   Ct Head Wo Contrast  09/10/2011  *RADIOLOGY REPORT*  Clinical Data: Confusion, weakness, previous stroke.  CT HEAD WITHOUT CONTRAST  Technique:  Contiguous axial images were obtained from the base of the skull through the vertex without contrast.  Comparison: 09/23/2008  Findings: The previously noted right thalamic hemorrhage has resolved. Atherosclerotic and physiologic intracranial calcifications.  Mild atrophy . There is no evidence of acute intracranial hemorrhage, brain edema, mass lesion, acute infarction,   mass effect, or midline shift. Acute infarct may be inapparent on noncontrast CT.  No other intra-axial abnormalities are seen, and the ventricles and sulci are within normal limits in size and symmetry.   No abnormal extra-axial fluid collections or  masses are identified.  No significant calvarial abnormality.  IMPRESSION: 1. Negative for bleed or other acute intracranial process.  Original Report Authenticated By: Trecia Rogers, M.D.   US Abdomen Complete  09/11/2011  *RADIOLOGY REPORT*  Clinical Data:  Elevated INR, liver disease, morbid obesity, prior cholecystectomy  ULTRASOUND ABDOMEN:  Technique:  Sonography of upper abdominal structures was performed. Examination limited by body habitus and bowel gas.  Comparison:  Renal ultrasound 09/03/2006  Gallbladder:  Surgically absent  Common bile duct:  8 mm diameter, may be normal post cholecystectomy  Liver:  Normal appearance  IVC:  Normal appearance  Pancreas:  Suboptimally visualized due to bowel gas, with portions of head and tail obscured.  Visualized portions normal appearance.  Spleen:  Normal appearance, 5.9 cm length  Right kidney:  10.8 cm length.  Increased cortical echogenicity. Small cyst 1.1 x 1.2 x 1.0 cm.  No gross hydronephrosis or shadowing calcification.  Left kidney:  Suboptimally visualized.  Approximately 10.7 cm length.  Marked cortical thinning.  Increased cortical echogenicity.  No gross mass or hydronephrosis seen on limited assessment.  Aorta:  Proximally normal caliber, remainder obscured by bowel gas.  Other:  No free fluid  IMPRESSION: Medical renal disease changes of both kidneys with cortical atrophy of left kidney. Post cholecystectomy with slightly dilated common bile duct 8 mm diameter, which may be physiologic; recommend correlation with LFTs. Incomplete visualization of pancreas, aorta and left kidney.  Original Report Authenticated By: Burnetta Sabin, M.D.   Dg Chest Port 1 View  09/16/2011  *RADIOLOGY REPORT*  Clinical Data: Respiratory failure  PORTABLE CHEST - 1 VIEW  Comparison:   the previous day's study  Findings: Right IJ central line and left to right arm PICC remain in place.  The patient has been extubated and the nasogastric tube removed.  Stable  cardiomegaly.  Cannot exclude small left  pleural effusion.  Slightly improved aeration with concomitant decrease in perihilar and bibasilar atelectasis or interstitial infiltrates.  IMPRESSION:  1.  Extubation with slightly improved aeration.  Original Report Authenticated By: Trecia Rogers, M.D.   Dg Chest Port 1 View  09/15/2011  *RADIOLOGY REPORT*  Clinical Data: Intubated  PORTABLE CHEST - 1 VIEW  Comparison:   the previous day's study  Findings: Endotracheal tube, nasogastric tube, and right IJ central line are stable in position.  Stable cardiomegaly.  Relatively low lung volumes with persistent perihilar and bibasilar atelectasis or interstitial infiltrates.  Cannot exclude small left pleural effusion.  IMPRESSION:  1.  Little change from previous day's portable exam.  Original Report Authenticated By: Trecia Rogers, M.D.   Dg Chest Port 1 View  09/14/2011  *RADIOLOGY REPORT*  Clinical Data: Follow up of infiltrate.  Shortness of breath. Morbid obesity.  PORTABLE CHEST - 1 VIEW  Comparison: 1 day prior  Findings: Endotracheal tube is unchanged, with tip at the level of the clavicles.  Nasogastric tube extends beyond the inferior aspect of the film.  Right IJ central line unchanged.  Mild cardiomegaly.  No right pleural effusion.  Cannot exclude small left pleural effusion. No pneumothorax.  Low lung volumes. Interval development of mild pulmonary venous congestion.  Mild bibasilar volume loss.  IMPRESSION: Cardiomegaly and low lung volumes with mild pulmonary venous congestion.  Similar patchy bibasilar atelectasis.  Possible small left pleural effusion.  Original Report Authenticated By: Areta Haber, M.D.   Dg Chest Port 1 View  09/13/2011  *RADIOLOGY REPORT*  Clinical Data: Endotracheal tube.  PORTABLE CHEST - 1 VIEW  Comparison: 09/12/2011.  Findings: Endotracheal tube is in satisfactory position. Nasogastric tube is followed into the stomach, with the tip projecting beyond the  inferior boundary of the film.  Right IJ central line and right PICC tips project over the SVC.  Heart is at the upper limits of normal in size, stable.  Left perihilar and right infrahilar air space disease persists.  Question left pleural effusion.  IMPRESSION: Left perihilar and right basilar airspace disease persist. Probable left pleural effusion.  Original Report Authenticated By: Luretha Rued, M.D.   Dg Chest Port 1 View  09/12/2011  *RADIOLOGY REPORT*  Clinical Data: Respiratory failure and status post intubation.  PORTABLE CHEST - 1 VIEW  Comparison: Film at 1030 hours.  Findings: Endotracheal tube present with the tip approximately 2.5 cm above the carina.  Stable positioning of temporary dialysis catheter with the tip in the lower SVC.  Additional right-sided PICC line tip continues to lie at the level of the proximal SVC. Nasogastric tube continues to extend into the stomach.  Lungs show stable cardiomegaly and left lower lobe atelectasis.  No overt edema present.  No evidence of pneumothorax.  No significant pleural fluid.  IMPRESSION: Endotracheal tube appropriately positioned with tip 2.5 cm above the carina.  Otherwise no significant change in appearance of the chest.  Original Report Authenticated By: Azzie Roup, M.D.   Dg Chest Port 1 View  09/12/2011  *RADIOLOGY REPORT*  Clinical Data: Line placement.  PORTABLE CHEST - 1 VIEW  Comparison: 09/12/2011  Findings: Right central line placement.  The tip is in the SVC.  No pneumothorax.  Right PICC line is unchanged.  Cardiomegaly.  Mild vascular congestion. No effusions.  Probable left basilar opacity, unchanged.  No overt edema.  IMPRESSION: Right central line tip in the SVC.  No pneumothorax.  Otherwise no change.  Original Report Authenticated By: Raelyn Number, M.D.   Dg Chest Port 1 View  09/12/2011  *RADIOLOGY REPORT*  Clinical Data: Edema, renal failure  PORTABLE CHEST - 1 VIEW  Comparison: 09/10/2011  Findings:  Cardiomegaly again noted.  No pulmonary edema.  Stable right arm PICC line position with tip in upper SVC.  Stable left basilar atelectasis or infiltrate.  IMPRESSION: No pulmonary edema.  Again noted left basilar atelectasis or infiltrate.  Original Report Authenticated By: Lahoma Crocker, M.D.   Dg Chest Port 1 View  09/04/2011  *RADIOLOGY REPORT*  Clinical Data: For central line placement  PORTABLE CHEST - 1 VIEW  Comparison: Portable chest x-ray of 11/21 for a 1012  Findings: The right upper extremity PICC line tip is in the mid lower SVC.  No pneumothorax is seen.  Cardiomegaly is stable.  A lap band is present.  IMPRESSION: Right PICC line tip in mid lower SVC.  No pneumothorax.  Original Report Authenticated By: Joretta Bachelor, M.D.    Scheduled Meds:   . cefTAZidime (FORTAZ)  IV  2 g Intravenous Q48H  . diltiazem  60 mg Oral Q6H  . metoprolol tartrate  50 mg Oral BID  . sotalol  80 mg Oral Q48H  . DISCONTD: cefTAZidime (FORTAZ)  IV  2 g Intravenous Q48H  . DISCONTD: cefTAZidime (FORTAZ)  IV  2 g Intravenous Q24H   Continuous Infusions:   . sodium chloride Stopped (09/17/11 1700)     Assessment/Plan: 1. Acute on chronic respiratory failure, history of asthma-was on prednisone until 09/17/11.  No current issues 2. Encephalopathy-resolved, was secondary to possible renal insufficiency and other issues 3. 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 and antibiotic be placed and 3/12-had washout 3/2 with non-purulence and seroma-I. he has consulted and have recommended 6 weeks total of antibiotics finishing 10/16/2011-will need to continue ceftaz time every 48 hourly with renal dose 4. 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 Prior to that was oliguric with -400 cc. Cause remains undefined but could be secondary to multiple issues including ATN, AIN, Vancomycin toxicity-her ANA profile was  positive with speckled pattern and the titer was 1:40-Will order a double stranded DNA test and reassess 5. Hypercalcemia-repeat labs in the morning. Wonder if this could be secondary to bony remodeling from recent orthopedic procedure. 6. Anemia-ABLA/Iron deficiency-her baseline seems to be between 8 and 9. I think she might have been transfused this admission. We will transfuse if her hemoglobin drops below 7 repeat labs in the morning. 7. Leukocytosis-likely secondary to margination from steroids.  As no fever would not workup any further present. 8. Atrial fibrillation-alternating with normal sinus at present rates in the low 100s. Continue diltiazem 60 mg 4 times a day Toprol 50 mg twice a day sotalol 80 mg every 48 hourly. 9. Obstructive sleep apnea-continue CPAP  Code Status: Full Family Communication: Discussed with daughter in room Disposition Plan: Disposition depends on whether she needs dialysis or not. Am suspecting that if she is not very mobile she will likely need a SNF which could happen within the next 24-48 hours   Verneita Griffes, MD  [facility] Pager [phone] 09/19/2011, 5:27 PM    LOS: 9 days

## 2011-09-19 NOTE — Consult Note (Signed)
Infectious Diseases Initial Consultation  Reason for Consultation:  antiobiotic management.   HPI: [name] is a 66 y.o. female with recent right hip excisional arthroplasty with Pseudomonas on Ceftazdime prior to admission who presented with respiratory failure and confusion on 2/25.  CXR had an opacity and patient treated for HCAP.  Initial WBC was 13.  Patient started on steroids 2/27.  No fever during the hospitalization.  Also noted to have hypercalcemia.  Renal failure has worsened and required intermittent HD.  At this time, she though feels well and is having no further problems and mentation is normal according to her husband.  No increased pain in her hip.  She also has a history of C diff.    Past Medical History  Diagnosis Date  . Hypertension   . Depression   . Fractures, stress     in both feet  . Kidney disease     pt states creatinine is high  . 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 Stroke   . Gout   . Asthma     MILD NOW-NO INHALERS  . Sleep apnea     USES CPAP  . Anemia   . Blood transfusion   . Arthritis     HIP  . Atrial fibrillation     PT TAKES ASPIRIN AS BLOOD THINNER-HX OF CEREBRAL BLEED WHILE ON COUMADIN  . Arrhythmia   . Cardiomyopathy   . Hypercalcemia     09/10/11  . HCAP (healthcare-associated pneumonia)     09/10/11    Allergies:  Allergies  Allergen Reactions  . Coumadin Other (See Comments)    Caused her to have a stroke    Current antibiotics:   MEDICATIONS:    . cefTAZidime (FORTAZ)  IV  2 g Intravenous Q48H  . diltiazem  60 mg Oral Q6H  . metoprolol tartrate  50 mg Oral BID  . sotalol  80 mg Oral Q48H    History  Substance Use Topics  . Smoking status: Never Smoker   . Smokeless tobacco: Never Used  . Alcohol Use: No    Family History  Problem Relation Age of Onset  . Diabetes Mother   . Hypertension Mother   . Hodgkin's lymphoma  32    dscd---HODGKINS DISEASE  . Cancer Father     Review of Systems -  Negative except as per HPI  OBJECTIVE: Temp:  [97.8 F (36.6 C)-98.4 F (36.9 C)] 97.9 F (36.6 C) (03/06 0444) Pulse Rate:  [62-80] 79  (03/06 1038) Resp:  [16-24] 20  (03/06 0444) BP: (123-155)/(44-103) 137/64 mmHg (03/06 1038) SpO2:  [84 %-100 %] 84 % (03/06 0444) Weight:  [244 lb 7.8 oz (110.9 kg)-252 lb 3.3 oz (114.4 kg)] 244 lb 7.8 oz (110.9 kg) (03/05 2124) General appearance: alert, cooperative and no distress Resp: clear to auscultation bilaterally Cardio: regular rate and rhythm, S1, S2 normal, no murmur, click, rub or gallop Extremities: extremities normal, atraumatic, no cyanosis or edema  LABS: Results for orders placed during the hospital encounter of 09/10/11 (from the past 48 hour(s))  GLUCOSE, CAPILLARY     Status: Abnormal   Collection Time   09/17/11  3:55 PM      Component Value Range Comment   Glucose-Capillary 109 (*) 70 - 99 (mg/dL)   GLUCOSE, CAPILLARY     Status: Abnormal   Collection Time   09/17/11  5:23 PM      Component Value Range Comment   Glucose-Capillary 140 (*)  70 - 99 (mg/dL)   GLUCOSE, CAPILLARY     Status: Abnormal   Collection Time   09/17/11  9:43 PM      Component Value Range Comment   Glucose-Capillary 126 (*) 70 - 99 (mg/dL)   GLUCOSE, CAPILLARY     Status: Abnormal   Collection Time   09/18/11  4:15 AM      Component Value Range Comment   Glucose-Capillary 119 (*) 70 - 99 (mg/dL)   RENAL FUNCTION PANEL     Status: Abnormal   Collection Time   09/18/11  5:35 AM      Component Value Range Comment   Sodium 138  135 - 145 (mEq/L)    Potassium 3.8  3.5 - 5.1 (mEq/L)    Chloride 101  96 - 112 (mEq/L)    CO2 24  19 - 32 (mEq/L)    Glucose, Bld 96  70 - 99 (mg/dL)    BUN 64 (*) 6 - 23 (mg/dL)    Creatinine, Ser 6.97 (*) 0.50 - 1.10 (mg/dL)    Calcium 11.7 (*) 8.4 - 10.5 (mg/dL)    Phosphorus 4.8 (*) 2.3 - 4.6 (mg/dL)    Albumin 1.9 (*) 3.5 - 5.2 (g/dL)    GFR calc non Af Amer 6 (*) >90 (mL/min)    GFR calc Af Amer 7 (*) >90 (mL/min)     IRON AND TIBC     Status: Abnormal   Collection Time   09/18/11  5:35 AM      Component Value Range Comment   Iron 68  42 - 135 (ug/dL)    TIBC 144 (*) 250 - 470 (ug/dL)    Saturation Ratios 47  20 - 55 (%)    UIBC 76 (*) 125 - 400 (ug/dL)   FERRITIN     Status: Normal   Collection Time   09/18/11  5:35 AM      Component Value Range Comment   Ferritin 263  10 - 291 (ng/mL)   VITAMIN D 25 HYDROXY     Status: Abnormal   Collection Time   09/18/11  5:35 AM      Component Value Range Comment   Vit D, 25-Hydroxy <10 (*) 30 - 89 (ng/mL)   CBC     Status: Abnormal   Collection Time   09/18/11  5:35 AM      Component Value Range Comment   WBC 17.1 (*) 4.0 - 10.5 (K/uL)    RBC 2.79 (*) 3.87 - 5.11 (MIL/uL)    Hemoglobin 8.1 (*) 12.0 - 15.0 (g/dL)    HCT 23.9 (*) 36.0 - 46.0 (%)    MCV 85.7  78.0 - 100.0 (fL)    MCH 29.0  26.0 - 34.0 (pg)    MCHC 33.9  30.0 - 36.0 (g/dL)    RDW 16.5 (*) 11.5 - 15.5 (%)    Platelets 209  150 - 400 (K/uL)   GLUCOSE, CAPILLARY     Status: Normal   Collection Time   09/18/11  7:55 AM      Component Value Range Comment   Glucose-Capillary 90  70 - 99 (mg/dL)    Comment 1 Documented in Chart      Comment 2 Notify RN     ANA     Status: Abnormal   Collection Time   09/18/11  3:30 PM      Component Value Range Comment   ANA POSITIVE (*) NEGATIVE    C3 COMPLEMENT  Status: Abnormal   Collection Time   09/18/11  3:30 PM      Component Value Range Comment   C3 Complement 83 (*) 90 - 180 (mg/dL)   C4 COMPLEMENT     Status: Abnormal   Collection Time   09/18/11  3:30 PM      Component Value Range Comment   Complement C4, Body Fluid 14 (*) 10 - 40 (mg/dL)   GLUCOSE, CAPILLARY     Status: Normal   Collection Time   09/18/11  4:35 PM      Component Value Range Comment   Glucose-Capillary 79  70 - 99 (mg/dL)    Comment 1 Documented in Chart      Comment 2 Notify RN     GLUCOSE, CAPILLARY     Status: Normal   Collection Time   09/18/11  9:20 PM      Component  Value Range Comment   Glucose-Capillary 70  70 - 99 (mg/dL)   GLUCOSE, CAPILLARY     Status: Normal   Collection Time   09/19/11 12:05 AM      Component Value Range Comment   Glucose-Capillary 89  70 - 99 (mg/dL)   RENAL FUNCTION PANEL     Status: Abnormal   Collection Time   09/19/11  5:18 AM      Component Value Range Comment   Sodium 138  135 - 145 (mEq/L)    Potassium 3.7  3.5 - 5.1 (mEq/L)    Chloride 102  96 - 112 (mEq/L)    CO2 27  19 - 32 (mEq/L)    Glucose, Bld 86  70 - 99 (mg/dL)    BUN 27 (*) 6 - 23 (mg/dL) DELTA CHECK NOTED   Creatinine, Ser 3.84 (*) 0.50 - 1.10 (mg/dL) DELTA CHECK NOTED   Calcium 10.6 (*) 8.4 - 10.5 (mg/dL)    Phosphorus 2.9  2.3 - 4.6 (mg/dL)    Albumin 1.9 (*) 3.5 - 5.2 (g/dL)    GFR calc non Af Amer 12 (*) >90 (mL/min)    GFR calc Af Amer 13 (*) >90 (mL/min)   CBC     Status: Abnormal   Collection Time   09/19/11  5:18 AM      Component Value Range Comment   WBC 14.0 (*) 4.0 - 10.5 (K/uL)    RBC 2.93 (*) 3.87 - 5.11 (MIL/uL)    Hemoglobin 8.3 (*) 12.0 - 15.0 (g/dL)    HCT 24.8 (*) 36.0 - 46.0 (%)    MCV 84.6  78.0 - 100.0 (fL)    MCH 28.3  26.0 - 34.0 (pg)    MCHC 33.5  30.0 - 36.0 (g/dL)    RDW 16.5 (*) 11.5 - 15.5 (%)    Platelets 200  150 - 400 (K/uL)   GLUCOSE, CAPILLARY     Status: Normal   Collection Time   09/19/11  7:42 AM      Component Value Range Comment   Glucose-Capillary 90  70 - 99 (mg/dL)    Comment 1 Notify RN      Comment 2 Documented in Chart     GLUCOSE, CAPILLARY     Status: Abnormal   Collection Time   09/19/11 12:12 PM      Component Value Range Comment   Glucose-Capillary 101 (*) 70 - 99 (mg/dL)    Comment 1 Notify RN      Comment 2 Documented in Chart       MICRO:  IMAGING:  No results found.  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   1) Respiratory failure - unclear if this was pneumonia or not, but she is not on O2 now and breathing  comfortably.  No further altered mental status.    2) Resected THA - continue with Fortaz as before and continue with close renal monitoring as before to adjust appropriately.  She should follow up with Dr. Tommy Medal in one month, though will check to see if it is already scheduled.    Thanks for the consult, call with questions

## 2011-09-19 NOTE — Progress Notes (Signed)
Subjective: Interval History: dialysis yesterday due to continued rise in creatinine and uremic symptoms/vol xs; better today breathing and mentally Still making good urine plus 2 liters off with HD Still has temporary catheter placed 2/27 Objective:  Vital signs in last 24 hours:  Temp:  [97.8 F (36.6 C)-98.4 F (36.9 C)] 97.9 F (36.6 C) (03/06 0444) Pulse Rate:  [62-80] 79  (03/06 1038) Resp:  [16-24] 20  (03/06 0444) BP: (126-155)/(64-103) 137/64 mmHg (03/06 1038) SpO2:  [84 %-100 %] 84 % (03/06 0444) Weight:  [110.9 kg (244 lb 7.8 oz)-114.4 kg (252 lb 3.3 oz)] 110.9 kg (244 lb 7.8 oz) (03/05 2124) Weight change: 0.7 kg (1 lb 8.7 oz)  Intake/Output: I/O last 3 completed shifts: In: 600 [P.O.:600] Out: 3885 [id number]; Drains:320; Other:2120]  Intake/Output this shift:    EXAM:  Alert, oriented, clearer today Lungs clear No pericardial rub Abdomen soft Minimal lle edema; 2+ rle edema Foley clear urine Temporary HD cath (2/27) right IJ  Lab Results:  Basename 09/19/11 0518 09/18/11 0535 09/17/11 0500  WBC 14.0* 17.1* 16.0*  HGB 8.3* 8.1* 8.2*  HCT 24.8* 23.9* 24.8*  PLT 200 209 209   BMET  Basename 09/19/11 0518 09/18/11 0535 09/17/11 0500  NA 138 138 140  K 3.7 3.8 4.0  CL 102 101 103  CO2 27 24 24   GLUCOSE 86 96 84  BUN 27* 64* 55*  CREATININE 3.84* 6.97* 6.46*  CALCIUM 10.6* 11.7* 11.0*  PHOS 2.9 4.8* 5.2*   LFT  Basename 09/19/11 0518  PROT --  ALBUMIN 1.9*  AST --  ALT --  ALKPHOS --  BILITOT --  BILIDIR --  IBILI --   Lab Results  Component Value Date   IRON 68 09/18/2011   TIBC 144* 09/18/2011   FERRITIN 263 09/18/2011   Vitamin D (25 and 1,25 both unmeasurable) PTH-related peptide 13 (14-27 = normal) Vancomycin 27.2 on 09/16/11 (stopped)  SCHEDULED MEDS REVIEWED . cefTAZidime (FORTAZ)  IV  2 g Intravenous Q48H  . diltiazem  60 mg Oral Q6H  . metoprolol tartrate  50 mg Oral BID  . sotalol  80 mg Oral Q48H  . DISCONTD: cefTAZidime  (FORTAZ)  IV  2 g Intravenous Q48H  . DISCONTD: cefTAZidime (FORTAZ)  IV  2 g Intravenous Q24H   Assessment/Plan: 1. Acute on CRF-  non-oliguric (as of yet unclear cause). UA shows mild microhemautria, prot 100. DDx: hypercalcemia, vanco toxicity, immune-complex GN due to indolent infection (hip), AIN due to meds, ATN, etc. Last dialysis was 09/18/2011. Non-oliguric. SPEP, UPEP pending; C3 slightly low; C4 normal; still requiring intermittent HD; reassess daily 2. Hypercalcemia-I have no idea why; Vit D, PTH, PTH-RP, all normal; SPEP, UPEP pending 3. 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 Alvan Dame with sterile seroma;  4. HTN  5. Anemia- ABLA and acute illness with CKD at baseline, transfuse as needed; Aranesp; iron OK 6. CKD baseline creat 2.3, 7. Gout on allopurinol, reduced dose is appropriate  8. Resolved PNA 9. Volume- improved edema LE's post HD with UF of ~2L with last HD   LOS: 9 Leanard Dimaio B @TODAY @2 :45 PM

## 2011-09-20 LAB — VITAMIN D 1,25 DIHYDROXY
Vitamin D 1, 25 (OH)2 Total: <8 pg/mL — ABNORMAL LOW (ref 18–72)
Vitamin D2 1, 25 (OH)2: <8 pg/mL
Vitamin D3 1, 25 (OH)2: <8 pg/mL

## 2011-09-20 LAB — PROTEIN ELECTROPHORESIS, SERUM
Albumin ELP: 49.7 % — ABNORMAL LOW (ref 55.8–66.1)
Alpha-1-Globulin: 7.2 % — ABNORMAL HIGH (ref 2.9–4.9)
Alpha-2-Globulin: 11.7 % (ref 7.1–11.8)
Beta 2: 6 % (ref 3.2–6.5)
Beta Globulin: 4.1 % — ABNORMAL LOW (ref 4.7–7.2)
Gamma Globulin: 21.3 % — ABNORMAL HIGH (ref 11.1–18.8)
M-Spike, %: NOT DETECTED g/dL
Total Protein ELP: 5.4 g/dL — ABNORMAL LOW (ref 6.0–8.3)

## 2011-09-20 LAB — GLUCOSE, CAPILLARY
Glucose-Capillary: 97 mg/dL (ref 70–99)
Glucose-Capillary: 123 mg/dL — ABNORMAL HIGH (ref 70–99)
Glucose-Capillary: 123 mg/dL — ABNORMAL HIGH (ref 70–99)

## 2011-09-20 LAB — CBC
HCT: 24 % — ABNORMAL LOW (ref 36.0–46.0)
Hemoglobin: 8.1 g/dL — ABNORMAL LOW (ref 12.0–15.0)
MCH: 28.7 pg (ref 26.0–34.0)
MCHC: 33.8 g/dL (ref 30.0–36.0)
MCV: 85.1 fL (ref 78.0–100.0)
Platelets: 199 10*3/uL (ref 150–400)
RBC: 2.82 MIL/uL — ABNORMAL LOW (ref 3.87–5.11)
RDW: 16.8 % — ABNORMAL HIGH (ref 11.5–15.5)
WBC: 12.6 10*3/uL — ABNORMAL HIGH (ref 4.0–10.5)

## 2011-09-20 LAB — RENAL FUNCTION PANEL
Albumin: 2 g/dL — ABNORMAL LOW (ref 3.5–5.2)
BUN: 36 mg/dL — ABNORMAL HIGH (ref 6–23)
CO2: 25 mEq/L (ref 19–32)
Calcium: 11.5 mg/dL — ABNORMAL HIGH (ref 8.4–10.5)
Chloride: 102 mEq/L (ref 96–112)
Creatinine, Ser: 4.89 mg/dL — ABNORMAL HIGH (ref 0.50–1.10)
GFR calc Af Amer: 10 mL/min — ABNORMAL LOW (ref >90)
GFR calc non Af Amer: 9 mL/min — ABNORMAL LOW (ref >90)
Glucose, Bld: 92 mg/dL (ref 70–99)
Phosphorus: 3.3 mg/dL (ref 2.3–4.6)
Potassium: 3.5 mEq/L (ref 3.5–5.1)
Sodium: 138 mEq/L (ref 135–145)

## 2011-09-20 LAB — CALCIUM, IONIZED: Calcium, Ion: 1.66 mmol/L (ref 1.12–1.32)

## 2011-09-20 MED ORDER — DILTIAZEM HCL ER COATED BEADS 240 MG PO CP24
240.0000 mg | ORAL_CAPSULE | Freq: Every day | ORAL | Status: DC
  Administered 2011-09-20 – 2011-10-02 (×13): 240 mg via ORAL
  Filled 2011-09-20 (×14): qty 1

## 2011-09-20 MED ORDER — DARBEPOETIN ALFA-POLYSORBATE 200 MCG/0.4ML IJ SOLN
200.0000 ug | INTRAMUSCULAR | Status: DC
  Administered 2011-09-20 – 2011-09-28 (×2): 200 ug via SUBCUTANEOUS
  Filled 2011-09-20 (×2): qty 0.4

## 2011-09-20 MED ORDER — CALCITONIN (SALMON) 200 UNIT/ML IJ SOLN
100.0000 [IU] | Freq: Every day | INTRAMUSCULAR | Status: AC
  Administered 2011-09-20: 100 [IU] via SUBCUTANEOUS
  Administered 2011-09-21: 10:00:00 via SUBCUTANEOUS
  Administered 2011-09-22: 100 [IU] via SUBCUTANEOUS
  Filled 2011-09-20 (×3): qty 0.5

## 2011-09-20 NOTE — Progress Notes (Signed)
ID progress note  Subjective:  No acute evenst overnight. Patient feels comfortable on room air denying any respiratory problems. Hip wound is not tender and draining sanguinous fluid.  Objective: Vital signs in last 24 hours: Temp:  [97.5 F (36.4 C)-98.7 F (37.1 C)] 98.2 F (36.8 C) (03/07 0945) Pulse Rate:  [74-85] 85  (03/07 0945) Resp:  [18-20] 18  (03/07 0945) BP: (131-155)/(68-85) 155/77 mmHg (03/07 0945) SpO2:  [96 %-98 %] 98 % (03/07 0945) Weight:  [112.5 kg (248 lb 0.3 oz)] 112.5 kg (248 lb 0.3 oz) (03/06 2141)  Intake/Output from previous day: 03/06 0701 - 03/07 0700 In: 840 [P.O.:840] Out: 776 [Urine:625; Drains:150; Stool:1] Intake/Output this shift: Total I/O In: 200 [P.O.:200] Out: -   Pt is alert and cooperative, in no acute distress. CV: RRR, normal s1 s2 wo mrg LUNGS: CTA bilaterally MUSC: incision wound clear, non tender and non infected  Lab Results  Basename 09/20/11 0535 09/19/11 1830 09/19/11 0518  WBC 12.6* 14.8* --  HGB 8.1* 8.4* --  HCT 24.0* 25.1* --  NA 138 -- 138  K 3.5 -- 3.7  CL 102 -- 102  CO2 25 -- 27  BUN 36* -- 27*  CREATININE 4.89* -- 3.84*  GLU -- -- --   Liver Panel  Basename 09/20/11 0535 09/19/11 0518  PROT -- --  ALBUMIN 2.0* 1.9*  AST -- --  ALT -- --  ALKPHOS -- --  BILITOT -- --  BILIDIR -- --  IBILI -- --   Sedimentation Rate No results found for this basename: ESRSEDRATE in the last 72 hours C-Reactive Protein No results found for this basename: CRP:2 in the last 72 hours  Microbiology: Recent Results (from the past 240 hour(s))  CULTURE, BLOOD (ROUTINE X 2)     Status: Normal   Collection Time   09/10/11  7:55 PM      Component Value Range Status Comment   Specimen Description BLOOD   Final    Special Requests BOTTLES DRAWN AEROBIC AND ANAEROBIC 3CC   Final    Culture  Setup Time [id number]   Final    Culture NO GROWTH 5 DAYS   Final    Report Status 09/17/2011 FINAL   Final   CULTURE, BLOOD  (ROUTINE X 2)     Status: Normal   Collection Time   09/10/11  8:00 PM      Component Value Range Status Comment   Specimen Description BLOOD LEFT ARM   Final    Special Requests BOTTLES DRAWN AEROBIC AND ANAEROBIC 4CC   Final    Culture  Setup Time [id number]   Final    Culture NO GROWTH 5 DAYS   Final    Report Status 09/17/2011 FINAL   Final   URINE CULTURE     Status: Normal   Collection Time   09/10/11 10:53 PM      Component Value Range Status Comment   Specimen Description URINE, CATHETERIZED   Final    Special Requests NONE   Final    Culture  Setup Time [id number]   Final    Colony Count NO GROWTH   Final    Culture NO GROWTH   Final    Report Status 09/12/2011 FINAL   Final   MRSA PCR SCREENING     Status: Normal   Collection Time   09/11/11 12:48 AM      Component Value Range Status Comment   MRSA by PCR NEGATIVE  NEGATIVE  Final   CULTURE, RESPIRATORY     Status: Normal   Collection Time   09/12/11  6:53 PM      Component Value Range Status Comment   Specimen Description TRACHEAL ASPIRATE   Final    Special Requests NONE   Final    Gram Stain     Final    Value: NO WBC SEEN     MODERATE SQUAMOUS EPITHELIAL CELLS PRESENT     RARE GRAM POSITIVE COCCI IN PAIRS   Culture Non-Pathogenic Oropharyngeal-type Flora Isolated.   Final    Report Status 09/14/2011 FINAL   Final     Studies/Results: No results found.  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   1. Hip infection: Continue current regimen with ceftazidime. Adjust dose in relation to renal function. Last cr 4.89.   2. Respiratory infection: resolved, pt has no current breathing problems   LOS: 10 days    Christopulos, Georgios 09/20/2011, 11:21 AM

## 2011-09-20 NOTE — Progress Notes (Signed)
CRITICAL VALUE ALERT  Critical value received:  Ionized ca 1.66  Date of notification: 09/20/2011  Time of notification:  11:15 AM  Critical value read back:yes  Nurse who received alert:  Jennette Banker  MD notified (1st page):  Dr. Verlon Au  Time of first page:  11:16 AM  MD notified (2nd page):  Time of second page:  Responding MD:  Dr. Verlon Au  Time MD responded:  1120

## 2011-09-20 NOTE — Progress Notes (Signed)
PT Cancellation Note  Treatment cancelled today due to patient's refusal to participate. Pt was just returned to bed by nursing using [place] lift.   Pt reports being in chair all day and being too tired for PT.    [name] 09/20/2011, 4:34 PM Casmere Hollenbeck L. Braylin Formby DPT [phone]

## 2011-09-20 NOTE — Progress Notes (Signed)
PROGRESS NOTE  [name] [id number] DOB: 1946/03/28 DOA: 09/10/2011 PCP: Garnet Koyanagi, DO, DO  Brief narrative: 66 yo female admit 2/21 with confusion/AMS and found to be in AKI with creat 5.1 (after 2/13 admit for R hip infection breakdown)  Past medical history: Depression, stress fractures both feet, intracerebral hemorrhage 2009, gout, asthma, sleep apnea, anemia, arthritis, atrial fibrillation [not on Coumadin], cardiomyopathy  Consultants:  Pulmonary critical care  Nephrology  Infectious disease  Procedures: 2/25 CT HEAD>>>Negative for bleed or other acute intracranial process  2/27 Head CT >>>Stable noncontrast CT appearance the brain without acute intracranial abnormality.  2/28 Ortho called  3/1 remains awake alert, poor output  3/2 to OR for I&D R hip, placement abx beads, removal non purulent seroma  2/28 2D echo>>> EF 50-55%, mild MR, cannot exclude vegetation MV, mod TR, PA press 12mmHg, No obvious vegetation   Antibiotics: 2/25 Maxipime>>>off  2/26 Rocephin>> 2/26  2/25 Levaquin>>> 3/4  2/28 vanc>>>off  2/28 Ceftaz>>>    Subjective  Feels well. States she has some occasional nausea but no other issues. No chest pain no shortness of breath Last dialysis 09/18/11. No blurred vision double vision or fever  Objective    Interim History: All notes reviewed in detail.  Objective: Filed Vitals:   09/19/11 2141 09/19/11 2237 09/20/11 0540 09/20/11 0945  BP: 131/68  155/74 155/77  Pulse: 84  74 85  Temp: 98.7 F (37.1 C)  97.6 F (36.4 C) 98.2 F (36.8 C)  TempSrc: Oral  Oral Oral  Resp: 20 18 18 18   Height: 5\' 5"  (1.651 m)     Weight: 112.5 kg (248 lb 0.3 oz)     SpO2: 98% 96% 97% 98%    Intake/Output Summary (Last 24 hours) at 09/20/11 1048 Last data filed at 09/20/11 0900  Gross per 24 hour  Intake    920 ml  Output    776 ml  Net    144 ml    Exam:  General: Pleasant African American female, frail appearing but smiling.  Right  internal jugular access present Cardiovascular: S1-S2 no murmur rub or gallop. Slightly irregular rhythm Respiratory: Clinically clear Abdomen: Soft nontender nondistended Skin intact with a drain on the right thigh and some swelling right thigh right leg Neuro grossly intact  Data Reviewed: Basic Metabolic Panel:  Lab [id number] 0535 09/19/11 0518 09/18/11 0535 09/17/11 0500 09/16/11 0500  NA 138 138 138 140 141  K 3.5 3.7 -- -- --  CL 102 102 101 103 105  CO2 25 27 24 24 25   GLUCOSE 92 86 96 84 78  BUN 36* 27* 64* 55* 46*  CREATININE 4.89* 3.84* 6.97* 6.46* 5.71*  CALCIUM 11.5* 10.6* 11.7* 11.0* 9.9  MG -- -- -- -- --  PHOS 3.3 2.9 4.8* 5.2* 4.9*   Liver Function Tests:  Lab 09/20/11 0535 09/19/11 0518 09/18/11 0535 09/17/11 0500 09/16/11 0500  AST -- -- -- -- --  ALT -- -- -- -- --  ALKPHOS -- -- -- -- --  BILITOT -- -- -- -- --  PROT -- -- -- -- --  ALBUMIN 2.0* 1.9* 1.9* 1.9* 1.8*   No results found for this basename: LIPASE:5,AMYLASE:5 in the last 168 hours No results found for this basename: AMMONIA:5 in the last 168 hours CBC:  Lab 09/20/11 0535 09/19/11 1830 09/19/11 0518 09/18/11 0535 09/17/11 0500  WBC 12.6* 14.8* 14.0* 17.1* 16.0*  NEUTROABS -- 9.3* -- -- --  HGB 8.1* 8.4* 8.3* 8.1*  8.2*  HCT 24.0* 25.1* 24.8* 23.9* 24.8*  MCV 85.1 84.2 84.6 85.7 85.8  PLT 199 197 200 209 209   Cardiac Enzymes: No results found for this basename: CKTOTAL:5,CKMB:5,CKMBINDEX:5,TROPONINI:5 in the last 168 hours BNP: No components found with this basename: POCBNP:5 CBG:  Lab 09/20/11 0537 09/19/11 1212 09/19/11 0742 09/19/11 0005 09/18/11 2120  GLUCAP 97 101* 90 89 70    Recent Results (from the past 240 hour(s))  CULTURE, BLOOD (ROUTINE X 2)     Status: Normal   Collection Time   09/10/11  7:55 PM      Component Value Range Status Comment   Specimen Description BLOOD   Final    Special Requests BOTTLES DRAWN AEROBIC AND ANAEROBIC 3CC   Final    Culture  Setup Time  [id number]   Final    Culture NO GROWTH 5 DAYS   Final    Report Status 09/17/2011 FINAL   Final   CULTURE, BLOOD (ROUTINE X 2)     Status: Normal   Collection Time   09/10/11  8:00 PM      Component Value Range Status Comment   Specimen Description BLOOD LEFT ARM   Final    Special Requests BOTTLES DRAWN AEROBIC AND ANAEROBIC 4CC   Final    Culture  Setup Time [id number]   Final    Culture NO GROWTH 5 DAYS   Final    Report Status 09/17/2011 FINAL   Final   URINE CULTURE     Status: Normal   Collection Time   09/10/11 10:53 PM      Component Value Range Status Comment   Specimen Description URINE, CATHETERIZED   Final    Special Requests NONE   Final    Culture  Setup Time [id number]   Final    Colony Count NO GROWTH   Final    Culture NO GROWTH   Final    Report Status 09/12/2011 FINAL   Final   MRSA PCR SCREENING     Status: Normal   Collection Time   09/11/11 12:48 AM      Component Value Range Status Comment   MRSA by PCR NEGATIVE  NEGATIVE  Final   CULTURE, RESPIRATORY     Status: Normal   Collection Time   09/12/11  6:53 PM      Component Value Range Status Comment   Specimen Description TRACHEAL ASPIRATE   Final    Special Requests NONE   Final    Gram Stain     Final    Value: NO WBC SEEN     MODERATE SQUAMOUS EPITHELIAL CELLS PRESENT     RARE GRAM POSITIVE COCCI IN PAIRS   Culture Non-Pathogenic Oropharyngeal-type Flora Isolated.   Final    Report Status 09/14/2011 FINAL   Final      Studies: Dg Chest 1 View  09/10/2011  *RADIOLOGY REPORT*  Clinical Data: Altered mental status.  CHEST - 1 VIEW  Comparison: 09/04/2011  Findings: The patient has a right-sided PICC line, tip to the superior vena cava.  The heart is enlarged.  There is dense opacity at the left lung base which partially obscures the lateral hemidiaphragm.  Findings are consistent with infectious infiltrate. No evidence for pulmonary edema.  No evidence for pneumothorax. There is degenerative change  in the spine.  IMPRESSION:  1.  Cardiomegaly without pulmonary edema. 2.  Left lower lobe infiltrate.  Original Report Authenticated By: Glenice Bow, M.D.   Ct  Head Wo Contrast  09/12/2011  *RADIOLOGY REPORT*  Clinical Data: 66 year old female with decreased level of consciousness.  CT HEAD WITHOUT CONTRAST  Technique:  Contiguous axial images were obtained from the base of the skull through the vertex without contrast.  Comparison: 09/10/2011 and earlier.  Findings: Mild bubbly opacity in the right sphenoid.  Other Visualized paranasal sinuses and mastoids are clear.  The patient is intubated.  No acute scalp or orbit soft tissue findings. No acute osseous abnormality identified.  Stable cerebral volume.  No ventriculomegaly. Stable gray-white matter differentiation throughout the brain.  No midline shift, mass effect, or evidence of mass lesion.  No acute intracranial hemorrhage identified.  No evidence of cortically based acute infarction identified.  No suspicious intracranial vascular hyperdensity.  IMPRESSION: 1.  Stable noncontrast CT appearance the brain without acute intracranial abnormality. 2.  Mild paranasal sinus inflammatory changes in the setting of intubation.  Original Report Authenticated By: Randall An, M.D.   Ct Head Wo Contrast  09/10/2011  *RADIOLOGY REPORT*  Clinical Data: Confusion, weakness, previous stroke.  CT HEAD WITHOUT CONTRAST  Technique:  Contiguous axial images were obtained from the base of the skull through the vertex without contrast.  Comparison: 09/23/2008  Findings: The previously noted right thalamic hemorrhage has resolved. Atherosclerotic and physiologic intracranial calcifications.  Mild atrophy . There is no evidence of acute intracranial hemorrhage, brain edema, mass lesion, acute infarction,   mass effect, or midline shift. Acute infarct may be inapparent on noncontrast CT.  No other intra-axial abnormalities are seen, and the ventricles and sulci are  within normal limits in size and symmetry.   No abnormal extra-axial fluid collections or masses are identified.  No significant calvarial abnormality.  IMPRESSION: 1. Negative for bleed or other acute intracranial process.  Original Report Authenticated By: Trecia Rogers, M.D.   US Abdomen Complete  09/11/2011  *RADIOLOGY REPORT*  Clinical Data:  Elevated INR, liver disease, morbid obesity, prior cholecystectomy  ULTRASOUND ABDOMEN:  Technique:  Sonography of upper abdominal structures was performed. Examination limited by body habitus and bowel gas.  Comparison:  Renal ultrasound 09/03/2006  Gallbladder:  Surgically absent  Common bile duct:  8 mm diameter, may be normal post cholecystectomy  Liver:  Normal appearance  IVC:  Normal appearance  Pancreas:  Suboptimally visualized due to bowel gas, with portions of head and tail obscured.  Visualized portions normal appearance.  Spleen:  Normal appearance, 5.9 cm length  Right kidney:  10.8 cm length.  Increased cortical echogenicity. Small cyst 1.1 x 1.2 x 1.0 cm.  No gross hydronephrosis or shadowing calcification.  Left kidney:  Suboptimally visualized.  Approximately 10.7 cm length.  Marked cortical thinning.  Increased cortical echogenicity.  No gross mass or hydronephrosis seen on limited assessment.  Aorta:  Proximally normal caliber, remainder obscured by bowel gas.  Other:  No free fluid  IMPRESSION: Medical renal disease changes of both kidneys with cortical atrophy of left kidney. Post cholecystectomy with slightly dilated common bile duct 8 mm diameter, which may be physiologic; recommend correlation with LFTs. Incomplete visualization of pancreas, aorta and left kidney.  Original Report Authenticated By: Burnetta Sabin, M.D.   Dg Chest Port 1 View  09/16/2011  *RADIOLOGY REPORT*  Clinical Data: Respiratory failure  PORTABLE CHEST - 1 VIEW  Comparison:   the previous day's study  Findings: Right IJ central line and left to right arm PICC  remain in place.  The patient has been extubated and the  nasogastric tube removed.  Stable cardiomegaly.  Cannot exclude small left pleural effusion.  Slightly improved aeration with concomitant decrease in perihilar and bibasilar atelectasis or interstitial infiltrates.  IMPRESSION:  1.  Extubation with slightly improved aeration.  Original Report Authenticated By: Trecia Rogers, M.D.   Dg Chest Port 1 View  09/15/2011  *RADIOLOGY REPORT*  Clinical Data: Intubated  PORTABLE CHEST - 1 VIEW  Comparison:   the previous day's study  Findings: Endotracheal tube, nasogastric tube, and right IJ central line are stable in position.  Stable cardiomegaly.  Relatively low lung volumes with persistent perihilar and bibasilar atelectasis or interstitial infiltrates.  Cannot exclude small left pleural effusion.  IMPRESSION:  1.  Little change from previous day's portable exam.  Original Report Authenticated By: Trecia Rogers, M.D.   Dg Chest Port 1 View  09/14/2011  *RADIOLOGY REPORT*  Clinical Data: Follow up of infiltrate.  Shortness of breath. Morbid obesity.  PORTABLE CHEST - 1 VIEW  Comparison: 1 day prior  Findings: Endotracheal tube is unchanged, with tip at the level of the clavicles.  Nasogastric tube extends beyond the inferior aspect of the film.  Right IJ central line unchanged.  Mild cardiomegaly.  No right pleural effusion.  Cannot exclude small left pleural effusion. No pneumothorax.  Low lung volumes. Interval development of mild pulmonary venous congestion.  Mild bibasilar volume loss.  IMPRESSION: Cardiomegaly and low lung volumes with mild pulmonary venous congestion.  Similar patchy bibasilar atelectasis.  Possible small left pleural effusion.  Original Report Authenticated By: Areta Haber, M.D.   Dg Chest Port 1 View  09/13/2011  *RADIOLOGY REPORT*  Clinical Data: Endotracheal tube.  PORTABLE CHEST - 1 VIEW  Comparison: 09/12/2011.  Findings: Endotracheal tube is in satisfactory  position. Nasogastric tube is followed into the stomach, with the tip projecting beyond the inferior boundary of the film.  Right IJ central line and right PICC tips project over the SVC.  Heart is at the upper limits of normal in size, stable.  Left perihilar and right infrahilar air space disease persists.  Question left pleural effusion.  IMPRESSION: Left perihilar and right basilar airspace disease persist. Probable left pleural effusion.  Original Report Authenticated By: Luretha Rued, M.D.   Dg Chest Port 1 View  09/12/2011  *RADIOLOGY REPORT*  Clinical Data: Respiratory failure and status post intubation.  PORTABLE CHEST - 1 VIEW  Comparison: Film at 1030 hours.  Findings: Endotracheal tube present with the tip approximately 2.5 cm above the carina.  Stable positioning of temporary dialysis catheter with the tip in the lower SVC.  Additional right-sided PICC line tip continues to lie at the level of the proximal SVC. Nasogastric tube continues to extend into the stomach.  Lungs show stable cardiomegaly and left lower lobe atelectasis.  No overt edema present.  No evidence of pneumothorax.  No significant pleural fluid.  IMPRESSION: Endotracheal tube appropriately positioned with tip 2.5 cm above the carina.  Otherwise no significant change in appearance of the chest.  Original Report Authenticated By: Azzie Roup, M.D.   Dg Chest Port 1 View  09/12/2011  *RADIOLOGY REPORT*  Clinical Data: Line placement.  PORTABLE CHEST - 1 VIEW  Comparison: 09/12/2011  Findings: Right central line placement.  The tip is in the SVC.  No pneumothorax.  Right PICC line is unchanged.  Cardiomegaly.  Mild vascular congestion. No effusions.  Probable left basilar opacity, unchanged.  No overt edema.  IMPRESSION: Right central line  tip in the SVC.  No pneumothorax.  Otherwise no change.  Original Report Authenticated By: Raelyn Number, M.D.   Dg Chest Port 1 View  09/12/2011  *RADIOLOGY REPORT*  Clinical Data:  Edema, renal failure  PORTABLE CHEST - 1 VIEW  Comparison: 09/10/2011  Findings: Cardiomegaly again noted.  No pulmonary edema.  Stable right arm PICC line position with tip in upper SVC.  Stable left basilar atelectasis or infiltrate.  IMPRESSION: No pulmonary edema.  Again noted left basilar atelectasis or infiltrate.  Original Report Authenticated By: Lahoma Crocker, M.D.   Dg Chest Port 1 View  09/04/2011  *RADIOLOGY REPORT*  Clinical Data: For central line placement  PORTABLE CHEST - 1 VIEW  Comparison: Portable chest x-ray of 11/21 for a 1012  Findings: The right upper extremity PICC line tip is in the mid lower SVC.  No pneumothorax is seen.  Cardiomegaly is stable.  A lap band is present.  IMPRESSION: Right PICC line tip in mid lower SVC.  No pneumothorax.  Original Report Authenticated By: Joretta Bachelor, M.D.    Scheduled Meds:    . calcitonin  100 Units Subcutaneous Daily  . cefTAZidime (FORTAZ)  IV  2 g Intravenous Q48H  . darbepoetin (ARANESP) injection - NON-DIALYSIS  200 mcg Subcutaneous Q Thu-1800  . diltiazem  60 mg Oral Q6H  . metoprolol tartrate  50 mg Oral BID  . sotalol  80 mg Oral Q48H  . DISCONTD: cefTAZidime (FORTAZ)  IV  2 g Intravenous Q48H  . DISCONTD: cefTAZidime (FORTAZ)  IV  2 g Intravenous Q24H   Continuous Infusions:    . sodium chloride Stopped (09/17/11 1700)     Assessment/Plan: 1. Acute on chronic respiratory failure, history of asthma-was on prednisone until 09/17/11.  No current issues and stable 2. Encephalopathy-resolved, was secondary to possible renal insufficiency.issues. Currently mentating well and seems to be a baseline 3. 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 and antibiotic beads placed and 3/12-had washout 3/2 with non-purulence and seroma-ID has consulted and have recommended 6 weeks total of antibiotics finishing 10/16/2011-will need to continue ceftaz time every 48 hourly with  renal dosing.  She pharmacy assistance 4. 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 Prior to that was oliguric with -400 cc. Cause remains undefined but could be secondary to multiple issues including ATN, AIN, Vancomycin toxicity-her ANA profile was positive with speckled pattern and the titer was 1:40-Will order a double stranded DNA test and reassess-I think that this is low yield but must be ruled out in her specific case-I personally discussed her case with nephrologist who recommends to reassess on a day by day basis the need for dialysis and we're happy to oblige 5. Hypercalcemia-repeat labs in the morning. Wonder if this could be secondary to bony remodeling from recent orthopedic procedure-she has had an extensive workup including PTH, vitamin D. level, and most of the usual workup. Her protein electrophoresis is pending and anti-DNA antibody is also pending. Agree with nephrology's plans to start empiric calcitonin. Likely would benefit from IV pamidronate, however renal insufficiency may preclude its use 6. Anemia-ABLA/Iron deficiency-her baseline seems to be between 8 and 9. I think she might have been transfused this admission. We will transfuse if her hemoglobin drops below 7 repeat labs in the morning.  Currently on Aranesp. 7. Leukocytosis-likely secondary to margination from steroids.  As no fever would not  workup any further present. 8. Atrial fibrillation-alternating with normal sinus with 1st degree AVB at present rates in the low 100s. change diltiazem from 60 mg 4 times a day to Diltiazem 240, Toprol 50 mg twice a day sotalol 80 mg every 48 hourly. 9. Obstructive sleep apnea-continue CPAP 10. Hypertension-blood pressures not controlled. Could uptitrate control with Norvasc 5 mg in a.m. if need  Code Status: Full Family Communication: Discussed with husband was in room Disposition Plan: Disposition depends on whether she needs dialysis or not.  Will defer to judgment with nephrology regarding when to discharge as she may need intermittent dialysis.  Verneita Griffes, MD  [facility] Pager [phone] 09/20/2011, 10:48 AM    LOS: 10 days

## 2011-09-20 NOTE — Progress Notes (Signed)
Subjective: Interval History:  Dialysis 2 days ago   Objective:  Vital signs in last 24 hours:  Temp:  [97.5 F (36.4 C)-98.7 F (37.1 C)] 97.6 F (36.4 C) (03/07 0540) Pulse Rate:  [74-84] 74  (03/07 0540) Resp:  [18-20] 18  (03/07 0540) BP: (131-155)/(64-85) 155/74 mmHg (03/07 0540) SpO2:  [96 %-98 %] 97 % (03/07 0540) Weight:  [112.5 kg (248 lb 0.3 oz)] 112.5 kg (248 lb 0.3 oz) (03/06 2141) Weight change: -1.9 kg (-4 lb 3 oz) Scheduled Medicines Reviewed: . cefTAZidime (FORTAZ)  IV  2 g Intravenous Q48H  . diltiazem  60 mg Oral Q6H  . metoprolol tartrate  50 mg Oral BID  . sotalol  80 mg Oral Q48H  . DISCONTD: cefTAZidime (FORTAZ)  IV  2 g Intravenous Q48H  . DISCONTD: cefTAZidime (FORTAZ)  IV  2 g Intravenous Q24H   EXAM: Alert, oriented X3 Lungs entirely clear No pericardial rum Obese abdomen NT 3+right leg edema, 1+left Foley clear urine Lab Results:  Basename 09/20/11 0535 09/19/11 1830 09/19/11 0518  WBC 12.6* 14.8* 14.0*  HGB 8.1* 8.4* 8.3*  HCT 24.0* 25.1* 24.8*  PLT 199 197 200   BMET  Basename 09/20/11 0535 09/19/11 0518 09/18/11 0535  NA 138 138 138  K 3.5 3.7 3.8  CL 102 102 101  CO2 25 27 24   GLUCOSE 92 86 96  BUN 36* 27* 64*  CREATININE 4.89* 3.84* 6.97*  CALCIUM 11.5* 10.6* 11.7*  PHOS 3.3 2.9 4.8*   Basename 09/20/11 0535  PROT --  ALBUMIN 2.0*  AST --  ALT --  ALKPHOS --  BILITOT --  BILIDIR --  IBILI --   Lab Results  Component Value Date   IRON 68 09/18/2011   TIBC 144* 09/18/2011   FERRITIN 263 09/18/2011   Assessment/Plan: 1. Acute on CRF- non-oliguric (as of yet unclear cause). UA shows mild microhemautria, prot 100. 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 Last dialysis was 09/18/2011. Non-oliguric. SPEP, UPEP pending; C3 slightly low; ANA low titer positive; C4 normal; still requiring  intermittent HD; reassess daily; no indication today 2. Hypercalcemia-I have no idea why; Vit D, PTH, PTH-RP, all normal; SPEP, UPEP pending; onset around 2/21 when looking back in records; seems excessive for just bond remodeling but not sure what else could be causing; will rx with calcitonin 3. 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 Alvan Dame with sterile seroma;  4. HTN  5. Anemia- ABLA and acute illness with CKD at baseline, transfuse as needed; Aranesp started; iron OK 6. CKD baseline creat 2.3, 7. Gout on allopurinol, reduced dose is appropriate  8. Resolved PNA 9. Volume- improved edema LE's post HD 10.   LOS: 10 [name] B @TODAY @10 :06 AM

## 2011-09-21 ENCOUNTER — Inpatient Hospital Stay (HOSPITAL_COMMUNITY): Payer: Medicare Other

## 2011-09-21 LAB — RENAL FUNCTION PANEL
Albumin: 2.1 g/dL — ABNORMAL LOW (ref 3.5–5.2)
BUN: 41 mg/dL — ABNORMAL HIGH (ref 6–23)
CO2: 24 mEq/L (ref 19–32)
Calcium: 13.1 mg/dL (ref 8.4–10.5)
Chloride: 103 mEq/L (ref 96–112)
Creatinine, Ser: 5.61 mg/dL — ABNORMAL HIGH (ref 0.50–1.10)
GFR calc Af Amer: 8 mL/min — ABNORMAL LOW (ref >90)
GFR calc non Af Amer: 7 mL/min — ABNORMAL LOW (ref >90)
Glucose, Bld: 96 mg/dL (ref 70–99)
Phosphorus: 3.4 mg/dL (ref 2.3–4.6)
Potassium: 3.6 mEq/L (ref 3.5–5.1)
Sodium: 139 mEq/L (ref 135–145)

## 2011-09-21 LAB — UIFE/LIGHT CHAINS/TP QN, 24-HR UR
Albumin, U: DETECTED
Alpha 1, Urine: DETECTED — AB
Alpha 2, Urine: DETECTED — AB
Beta, Urine: DETECTED — AB
Free Kappa Lt Chains,Ur: 44.2 mg/dL — ABNORMAL HIGH (ref 0.14–2.42)
Free Kappa/Lambda Ratio: 2.57 ratio (ref 2.04–10.37)
Free Lambda Lt Chains,Ur: 17.2 mg/dL — ABNORMAL HIGH (ref 0.02–0.67)
Gamma Globulin, Urine: DETECTED — AB
Total Protein, Urine: 86.7 mg/dL

## 2011-09-21 LAB — CBC
HCT: 26.3 % — ABNORMAL LOW (ref 36.0–46.0)
Hemoglobin: 8.8 g/dL — ABNORMAL LOW (ref 12.0–15.0)
MCH: 28.9 pg (ref 26.0–34.0)
MCHC: 33.5 g/dL (ref 30.0–36.0)
MCV: 86.5 fL (ref 78.0–100.0)
Platelets: 217 10*3/uL (ref 150–400)
RBC: 3.04 MIL/uL — ABNORMAL LOW (ref 3.87–5.11)
RDW: 16.6 % — ABNORMAL HIGH (ref 11.5–15.5)
WBC: 13 10*3/uL — ABNORMAL HIGH (ref 4.0–10.5)

## 2011-09-21 LAB — GLUCOSE, CAPILLARY
Glucose-Capillary: 96 mg/dL (ref 70–99)
Glucose-Capillary: 99 mg/dL (ref 70–99)

## 2011-09-21 IMAGING — CR DG BONE SURVEY MET
10 series · 10 of 10 positions shown · non-contrast
Comparison: Previous related examinations.

CLINICAL DATA: Bone pain.  Right hip pain.  Clinical concern for
multiple myeloma.

METASTATIC BONE SURVEY

[t cervical spine ap]
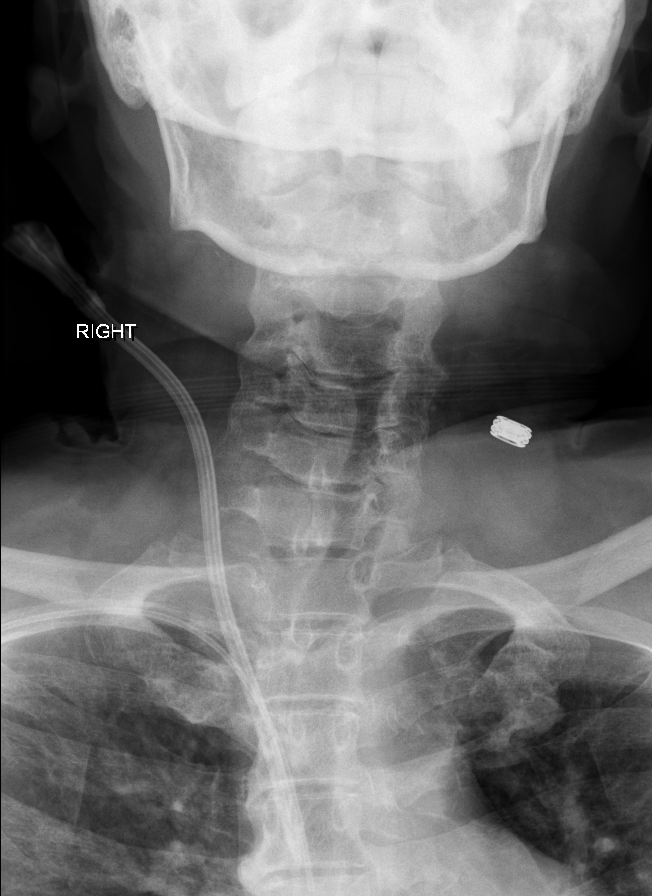

[t thoracic spine ap]
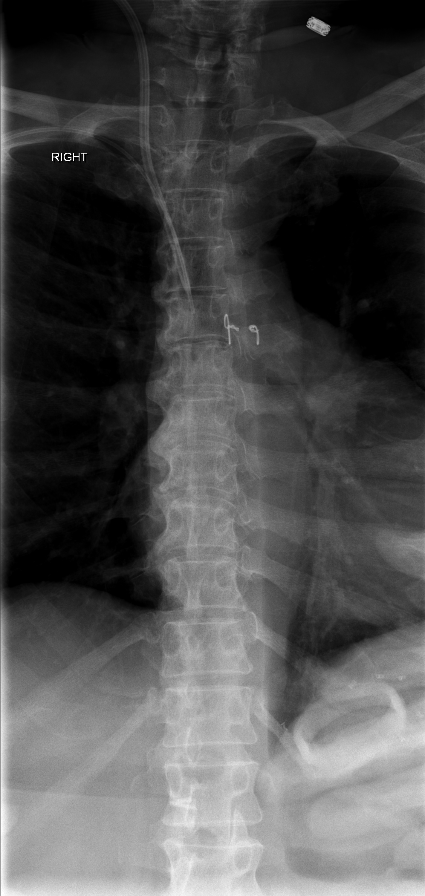

[t lumbar spine ap]
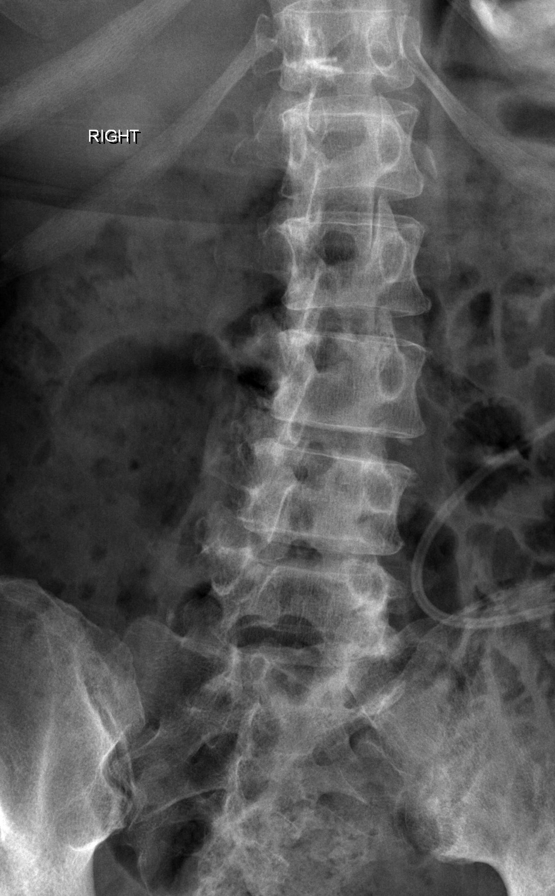

[t pelvis ap]
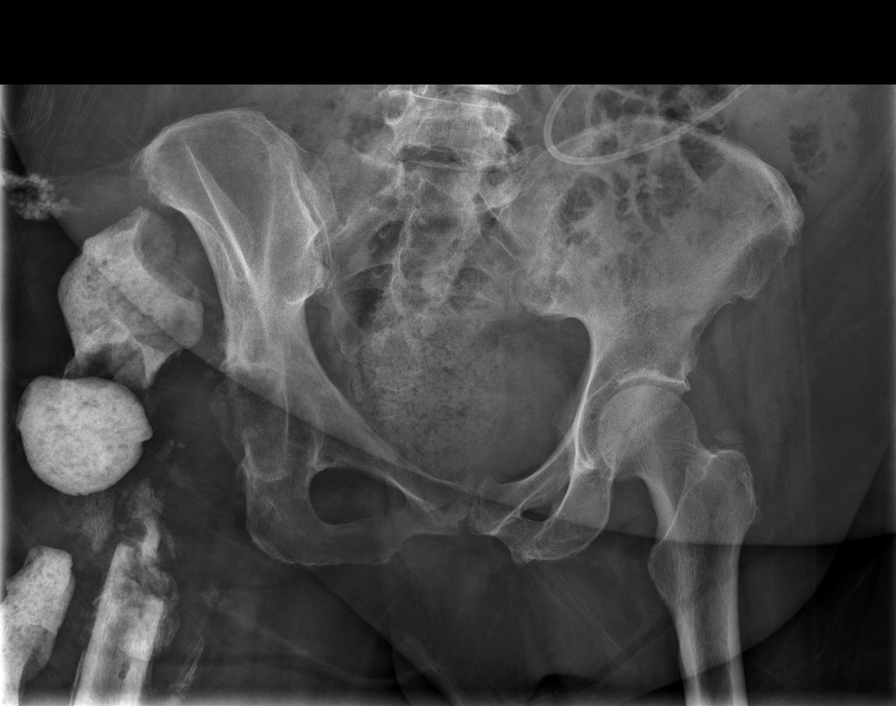

[t femur proximal ap left]
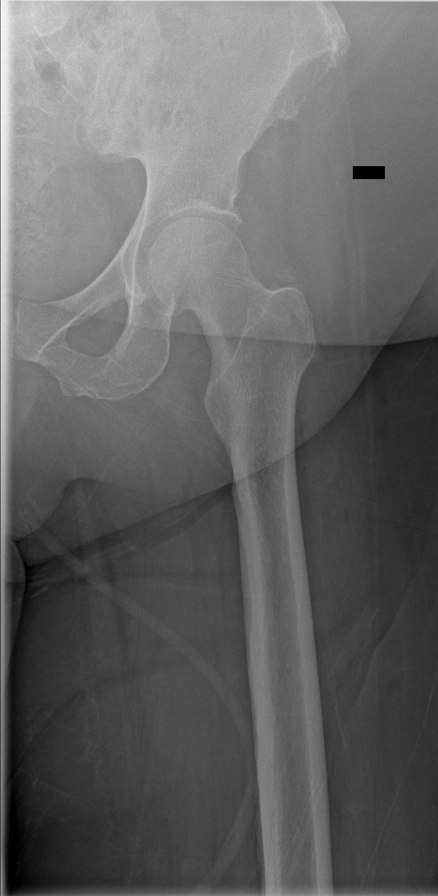

[t femur proximal ap right]
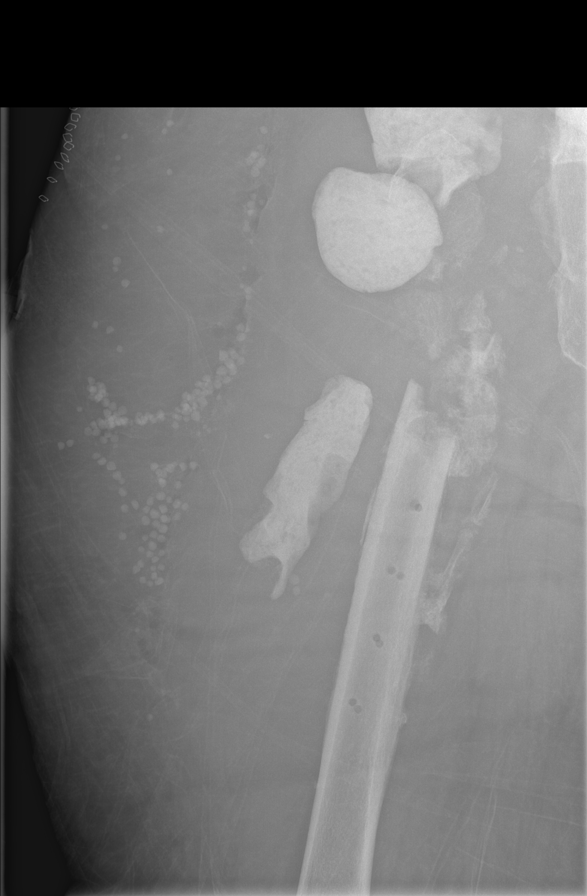

[t shoulder neutral left]
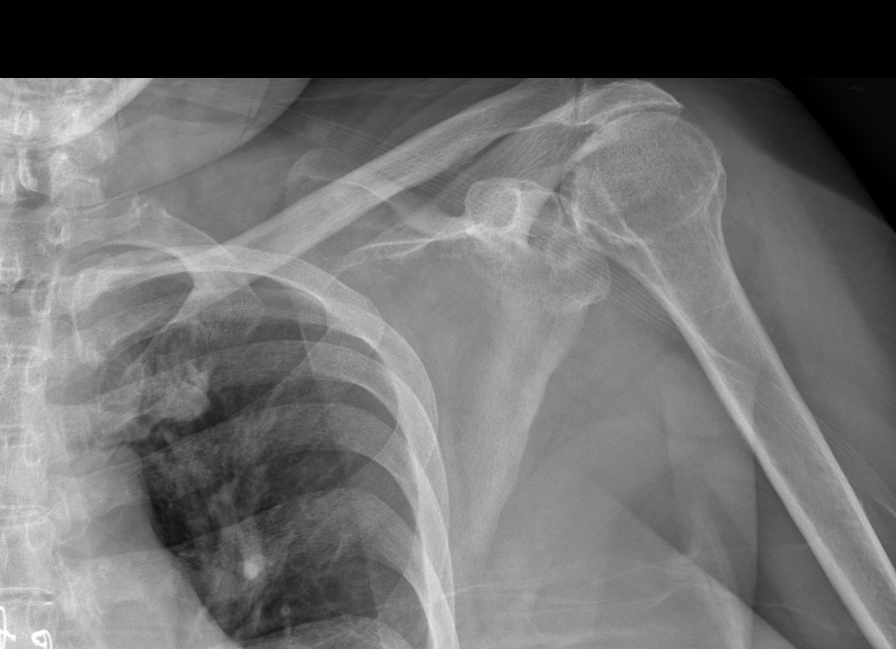

[t shoulder neutral right]
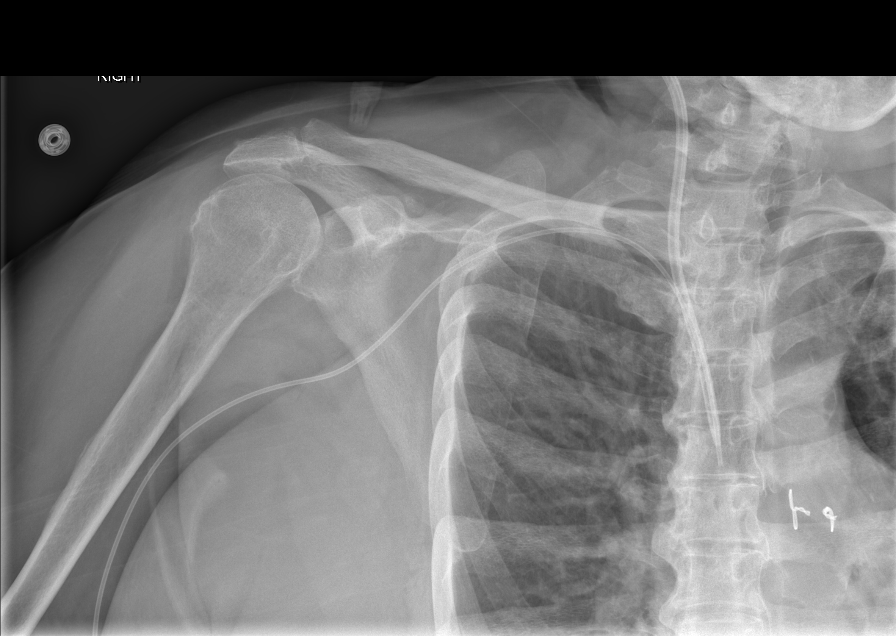

[x humerus ap right]
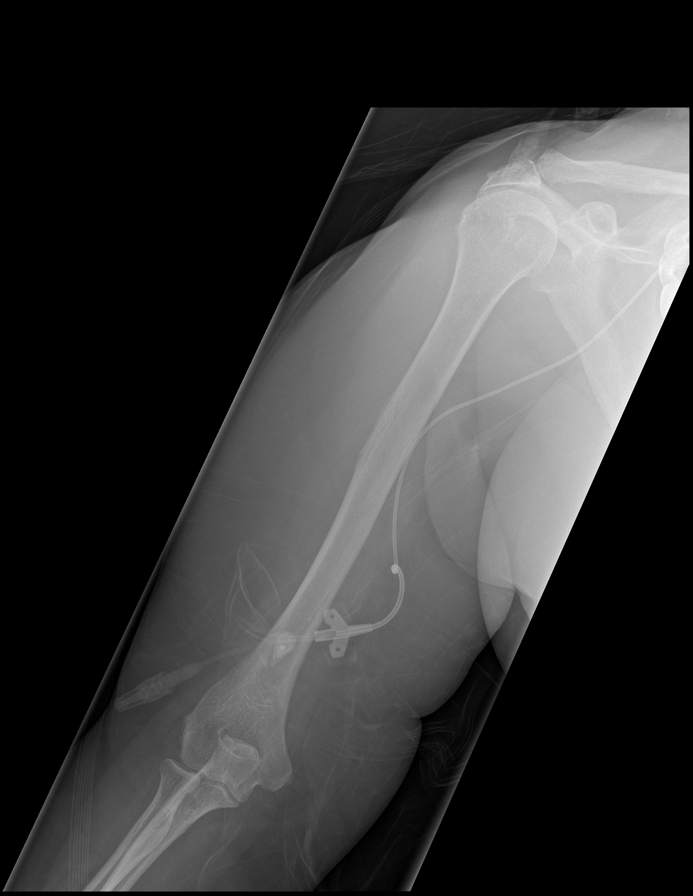

[x humerus ap left]
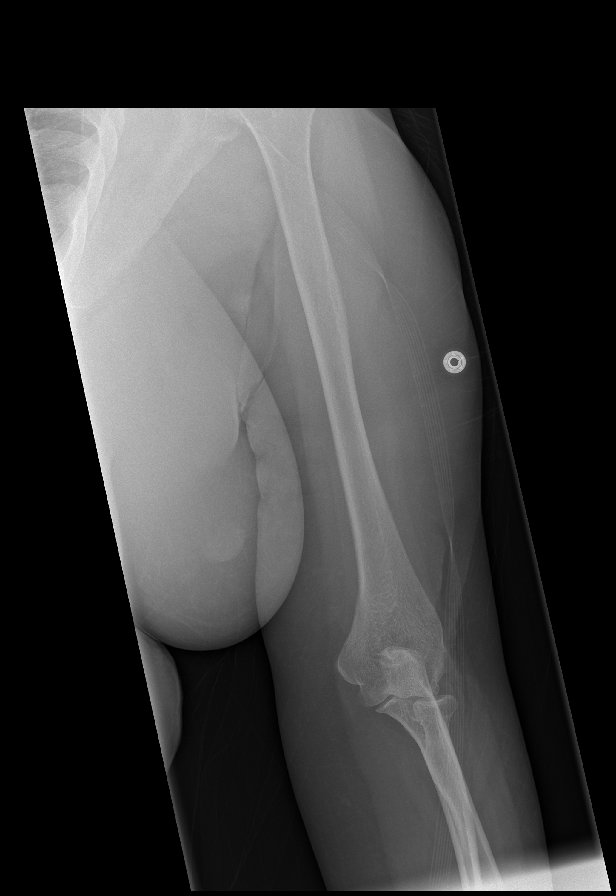

[10 of 10 positions shown; findings below may reference images not displayed]

FINDINGS: Lateral skull:  No lytic lesions.

AP and lateral cervical spine:  Multilevel degenerative changes.
Mild scoliosis.  Mild reversal of the normal cervical lordosis.  No
lytic lesions.

AP and lateral thoracic spine:  Multilevel degenerative changes,
including changes of DISH.  No lytic lesions.

AP and lateral lumbar spine:  Mild levoconvex rotary scoliosis.
Multilevel degenerative changes.  No lytic lesions.

AP pelvis:  Absence of the proximal right femur with extensive soft
tissue calcific density suggesting methylmethacrylate.  No lytic
lesions.

AP femurs:  Absence of the proximal right femur with screw holes in
the remaining midportion of the right femur.  Extensive calcific
density in the soft tissues compatible with methylmethacrylate.
Unremarkable proximal left femur.  No lytic lesions.

AP humeri:  Normal, without lytic lesions.

AP shoulders:  Mild bilateral acromioclavicular spur formation.  No
lytic lesions.
IMPRESSION: No lytic lesions or other findings suspicious for multiple myeloma.

## 2011-09-21 MED ORDER — POTASSIUM CHLORIDE CRYS ER 20 MEQ PO TBCR
30.0000 meq | EXTENDED_RELEASE_TABLET | Freq: Once | ORAL | Status: AC
  Administered 2011-09-21: 30 meq via ORAL
  Filled 2011-09-21: qty 1

## 2011-09-21 NOTE — Progress Notes (Signed)
[id number] Lab called Ca 13.1. Dr. Verlon Au notified no new orders

## 2011-09-21 NOTE — Plan of Care (Signed)
Problem: Food- and Nutrition-Related Knowledge Deficit (NB-1.1) Goal: Nutrition education Formal process to instruct or train a patient/client in a skill or to impart knowledge to help patients/clients voluntarily manage or modify food choices and eating behavior to maintain or improve health.  Outcome: Completed/Met Date Met:  09/21/11 Pt asked for education on how to follow carb modified diet at home. Pt provided with basic carb counting guidelines and renal pyramid handout. Topics included restricting foods high in potassium, phosphorus, and sodium when renal labs become elevated, low protein, basic carbohydrate counting and the importance of consistent carbohydrate intake was emphasized. Patient's questions were answered. Expect good compliance with diet recommendations provided.   [name] Pager [phone]

## 2011-09-21 NOTE — Progress Notes (Signed)
ANTIBIOTIC CONSULT NOTE - FOLLOW UP  Pharmacy Consult for ceftazaime Indication: pseudomonas prosthetic joint infection and pneumonia  Assessment: 45 YOF s/p recent removal of hip prosthesis and placement of antibiotic spacer.  Cultures grew pan-sensitive P. Aeruginosa.  Discharged on 2/21 and represented 2/25 with confusion from [facility]. Vanco/levofloxacin stopped following 6 days of tx.  Continues on fortaz 1gm IV q48h (Day #9). Pt is afebrile, WBC elevated at 13.0<<12.6<<14.8. UOP over last 24h: 0.7ml/kg/hr but scr increasing 5.61<<4.89<<3.84<<6.97. Will continue to monitor and f/u HD plans with renal. Will continue current regimen.   Plan:  1. Continue Fortaz to 2 gm IV q48hrs, per ID plan to continue for total of 6 weeks - stop date 10/16/11.   2. Cont to monitor renal fx, HD plan, WBC, and tx plans  Gerrit Halls, PharmD [phone] 09/21/2011,11:16 AM  Allergies  Allergen Reactions  . Coumadin Other (See Comments)    Caused her to have a stroke    Patient Measurements: Height: 5\' 5"  (165.1 cm) Weight: 248 lb 0.3 oz (112.5 kg) IBW/kg (Calculated) : 57  Adjusted Body Weight:   Vital Signs: Temp: 98.7 F (37.1 C) (03/08 0900) Temp src: Oral (03/08 0900) BP: 150/62 mmHg (03/08 0900) Pulse Rate: 73  (03/08 0900) Intake/Output from previous day: 03/07 0701 - 03/08 0700 In: 470 [P.O.:470] Out: 1500 [Urine:1500] Intake/Output from this shift: Total I/O In: 240 [P.O.:240] Out: -   Labs:  Basename 09/21/11 0638 09/20/11 0535 09/19/11 1830 09/19/11 0518  WBC 13.0* 12.6* 14.8* --  HGB 8.8* 8.1* 8.4* --  PLT 217 199 197 --  LABCREA -- -- -- --  CREATININE 5.61* 4.89* -- 3.84*   Estimated Creatinine Clearance: 12.8 ml/min (by C-G formula based on Cr of 5.61). No results found for this basename: VANCOTROUGH:2,VANCOPEAK:2,VANCORANDOM:2,GENTTROUGH:2,GENTPEAK:2,GENTRANDOM:2,TOBRATROUGH:2,TOBRAPEAK:2,TOBRARND:2,AMIKACINPEAK:2,AMIKACINTROU:2,AMIKACIN:2, in the last 72 hours    Microbiology: Recent Results (from the past 720 hour(s))  SURGICAL PCR SCREEN     Status: Abnormal   Collection Time   08/28/11 10:33 AM      Component Value Range Status Comment   MRSA, PCR NEGATIVE  NEGATIVE  Final    Staphylococcus aureus POSITIVE (*) NEGATIVE  Final   GRAM STAIN     Status: Normal   Collection Time   09/03/11  8:08 AM      Component Value Range Status Comment   Specimen Description FLUID SYNOVIAL RIGHT HIP ON SWAB   Final    Special Requests NONE   Final    Gram Stain     Final    Value: ABUNDANT WBC PRESENT, PREDOMINANTLY PMN     NO ORGANISMS SEEN     Gram Stain Report Called to,Read Back By and Verified With:     DAVENPORT,S. RN AT [id number] ON 09/03/11 BY GILLESPIE,B.   Report Status 09/03/2011 FINAL   Final   BODY FLUID CULTURE     Status: Normal   Collection Time   09/03/11  8:09 AM      Component Value Range Status Comment   Specimen Description FLUID SYNOVIAL RIGHT HIP ON SWAB   Final    Special Requests NONE   Final    Gram Stain     Final    Value: ABUNDANT WBC PRESENT, PREDOMINANTLY PMN     NO ORGANISMS SEEN     Gram Stain Report Called to,Read Back By and Verified With: Gram Stain Report Called to,Read Back By and Verified With: Carolanne Grumbling RN @ [phone] ON 09/03/11 BY Ethel Rana B Performed by [facility]   Culture     Final    Value: RARE PSEUDOMONAS AERUGINOSA     Note: CRITICAL RESULT CALLED TO, READ BACK BY AND VERIFIED WITH: KIMBERLY OWENS   Report Status 09/06/2011 FINAL   Final    Organism ID, Bacteria PSEUDOMONAS AERUGINOSA   Final   ANAEROBIC CULTURE     Status: Normal   Collection Time   09/03/11  8:10 AM      Component Value Range Status Comment   Specimen Description FLUID SYNOVIAL RIGHT HIP   Final    Special Requests NONE   Final    Gram Stain     Final    Value: NO WBC SEEN     NO SQUAMOUS EPITHELIAL CELLS SEEN     NO ORGANISMS SEEN   Culture NO ANAEROBES ISOLATED   Final    Report Status 09/07/2011 FINAL   Final   CULTURE,  BLOOD (ROUTINE X 2)     Status: Normal   Collection Time   09/10/11  7:55 PM      Component Value Range Status Comment   Specimen Description BLOOD   Final    Special Requests BOTTLES DRAWN AEROBIC AND ANAEROBIC 3CC   Final    Culture  Setup Time [id number]   Final    Culture NO GROWTH 5 DAYS   Final    Report Status 09/17/2011 FINAL   Final   CULTURE, BLOOD (ROUTINE X 2)     Status: Normal   Collection Time   09/10/11  8:00 PM      Component Value Range Status Comment   Specimen Description BLOOD LEFT ARM   Final    Special Requests BOTTLES DRAWN AEROBIC AND ANAEROBIC 4CC   Final    Culture  Setup Time [id number]   Final    Culture NO GROWTH 5 DAYS   Final    Report Status 09/17/2011 FINAL   Final   URINE CULTURE     Status: Normal   Collection Time   09/10/11 10:53 PM      Component Value Range Status Comment   Specimen Description URINE, CATHETERIZED   Final    Special Requests NONE   Final    Culture  Setup Time [id number]   Final    Colony Count NO GROWTH   Final    Culture NO GROWTH   Final    Report Status 09/12/2011 FINAL   Final   MRSA PCR SCREENING     Status: Normal   Collection Time   09/11/11 12:48 AM      Component Value Range Status Comment   MRSA by PCR NEGATIVE  NEGATIVE  Final   CULTURE, RESPIRATORY     Status: Normal   Collection Time   09/12/11  6:53 PM      Component Value Range Status Comment   Specimen Description TRACHEAL ASPIRATE   Final    Special Requests NONE   Final    Gram Stain     Final    Value: NO WBC SEEN     MODERATE SQUAMOUS EPITHELIAL CELLS PRESENT     RARE GRAM POSITIVE COCCI IN PAIRS   Culture Non-Pathogenic Oropharyngeal-type Flora Isolated.   Final    Report Status 09/14/2011 FINAL   Final     Anti-infectives     Start     Dose/Rate Route Frequency Ordered Stop   09/21/11 1200   cefTAZidime (FORTAZ) 2 g in dextrose 5 % 50 mL IVPB  2 g 100 mL/hr over 30 Minutes Intravenous Every 48 hours 09/19/11 1347     09/20/11  1200   cefTAZidime (FORTAZ) 2 g in dextrose 5 % 50 mL IVPB  Status:  Discontinued        2 g 100 mL/hr over 30 Minutes Intravenous Every 24 hours 09/19/11 1329 09/19/11 1346   09/15/11 1110   tobramycin (NEBCIN) powder  Status:  Discontinued          As needed 09/15/11 1117 09/15/11 1135   09/15/11 1110   vancomycin (VANCOCIN) powder  Status:  Discontinued          As needed 09/15/11 1118 09/15/11 1135   09/15/11 1015   vancomycin (VANCOCIN) powder 2,000 mg  Status:  Discontinued        2,000 mg Other To Surgery 09/15/11 1009 09/16/11 1018   09/15/11 1015   tobramycin (NEBCIN) powder 1.2 g  Status:  Discontinued        1.2 g Topical To Surgery 09/15/11 1010 09/16/11 1018   09/15/11 1015   tobramycin (NEBCIN) powder 1.2 g  Status:  Discontinued        1.2 g Topical To Surgery 09/15/11 1010 09/16/11 1018   09/13/11 1100   cefTAZidime (FORTAZ) 2 g in dextrose 5 % 50 mL IVPB  Status:  Discontinued        2 g 100 mL/hr over 30 Minutes Intravenous Every 48 hours 09/13/11 1016 09/19/11 1329   09/12/11 2200   levofloxacin (LEVAQUIN) IVPB 500 mg  Status:  Discontinued        500 mg 100 mL/hr over 60 Minutes Intravenous Every 48 hours 09/11/11 0042 09/17/11 1250   09/12/11 0200   ceFEPIme (MAXIPIME) 1 g in dextrose 5 % 50 mL IVPB  Status:  Discontinued        1 g 100 mL/hr over 30 Minutes Intravenous Every 24 hours 09/11/11 1003 09/13/11 0851   09/11/11 0045   cefTRIAXone (ROCEPHIN) 1 g in dextrose 5 % 50 mL IVPB  Status:  Discontinued        1 g 100 mL/hr over 30 Minutes Intravenous Daily at bedtime 09/11/11 0017 09/11/11 1003   09/10/11 2300   vancomycin (VANCOCIN) 2,000 mg in sodium chloride 0.9 % 500 mL IVPB        2,000 mg 250 mL/hr over 120 Minutes Intravenous  Once 09/10/11 2200 09/11/11 0513   09/10/11 2145   ceFEPIme (MAXIPIME) 1 g in dextrose 5 % 50 mL IVPB        1 g 100 mL/hr over 30 Minutes Intravenous  Once 09/10/11 2138 09/11/11 0241   09/10/11 2145   Levofloxacin  (LEVAQUIN) IVPB 750 mg        750 mg 100 mL/hr over 90 Minutes Intravenous  Once 09/10/11 2138 09/10/11 2323

## 2011-09-21 NOTE — Progress Notes (Signed)
Nutrition Follow-up  Pt last received HD on 3/5. It is unsure if pt will need intermittent HD in the future, per nephrologist note.  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 Currently pt's GFR is 8. Pt reports her appetite is getting better. She reports she is now consuming >50% of meals. Doc flowsheets confirms pt PO intake is 50-75% of meals.   Pt states she enjoys the food here and would like education on carb modified diet to follow at home.  Carb counting and renal pyramid handouts provided for pt. Discussed foods to avoid when renal labs become elevated, low protein diet, and basics of carbohydrate counting with pt. Expect good compliance with diet recommendations.  Diet Order:  Carb Modified Medium (1600-2000 kcal)  Meds: Scheduled Meds:   . calcitonin  100 Units Subcutaneous Daily  . cefTAZidime (FORTAZ)  IV  2 g Intravenous Q48H  . darbepoetin (ARANESP) injection - NON-DIALYSIS  200 mcg Subcutaneous Q Thu-1800  . diltiazem  240 mg Oral Daily  . metoprolol tartrate  50 mg Oral BID  . sotalol  80 mg Oral Q48H   Continuous Infusions:   . sodium chloride Stopped (09/17/11 1700)   PRN Meds:.fentaNYL, heparin, heparin, heparin, heparin, lidocaine, lidocaine-prilocaine, nystatin, pentafluoroprop-tetrafluoroeth, sodium chloride  Labs:  CMP     Component Value Date/Time   NA 139 09/21/2011 0638   K 3.6 09/21/2011 0638   CL 103 09/21/2011 0638   CO2 24 09/21/2011 0638   GLUCOSE 96 09/21/2011 0638   BUN 41* 09/21/2011 0638   CREATININE 5.61* 09/21/2011 0638   CALCIUM 13.1* 09/21/2011 0638   CALCIUM 11.1* 09/11/2011 0825   PROT 5.5* 09/10/2011 2000   ALBUMIN 2.1* 09/21/2011 0638   AST 24 09/10/2011 2000   ALT 9 09/10/2011 2000   ALKPHOS 103 09/10/2011 2000   BILITOT 0.2* 09/10/2011 2000   GFRNONAA 7* 09/21/2011 0638   GFRAA 8* 09/21/2011 [id number]  Phosphorus: 3.4 on 09/21/11 WNL Mg: no new labs Noted Creatinine trending back up from 3.84 on 09/19/11  CBG (last 3)   Basename 09/21/11  0709 09/20/11 1638 09/20/11 1307  GLUCAP 99 123* 123*     Intake/Output Summary (Last 24 hours) at 09/21/11 1226 Last data filed at 09/21/11 0900  Gross per 24 hour  Intake    510 ml  Output   1500 ml  Net   -990 ml    Weight Status:  112.2 kg on 3/5 (post-dialysis)  Re-estimated needs (based on CKD III-IV non-HD):  2000-2200 kcal, 70-80 grams protein, 1.8-2 L fluid  Nutrition Dx:  Inadequate oral intake, progressing. New Nutrition Dx: Food and nutrition related knowledge deficit r/t lack of prior nutrition-related knowledge AEB pt report.  Goal:  PO intake to meet >/=90% of estimated nutrition needs. Met.  Intervention:    Education on carb counting and following renal diet provided to pt.   Continue to encourage PO intake as pt is able  Monitor:  PO intake, weight status, labs, I/O's, need for intermittent HD  [name] Pager #:  [phone]  Asencion Partridge Pager#: [phone]

## 2011-09-21 NOTE — Progress Notes (Signed)
PROGRESS NOTE  [name] [id number] DOB: [birth year] DOA: 09/10/2011 PCP: Garnet Koyanagi, DO, DO  Brief narrative: 66 yo female admit 2/21 with confusion/AMS and found to be in AKI with creat 5.1 (after 2/13 admit for R hip infection breakdown)  Past medical history: Depression, stress fractures both feet, intracerebral hemorrhage 2009, gout, asthma, sleep apnea, anemia, arthritis, atrial fibrillation [not on Coumadin], cardiomyopathy  Consultants:  Pulmonary critical care  Nephrology  Infectious disease  Procedures: 2/25 CT HEAD>>>Negative for bleed or other acute intracranial process  2/27 Head CT >>>Stable noncontrast CT appearance the brain without acute intracranial abnormality.  2/28 Ortho called  3/1 remains awake alert, poor output  3/2 to OR for I&D R hip, placement abx beads, removal non purulent seroma  2/28 2D echo>>> EF 50-55%, mild MR, cannot exclude vegetation MV, mod TR, PA press 63mmHg, No obvious vegetation   Antibiotics: 2/25 Maxipime>>>off  2/26 Rocephin>> 2/26  2/25 Levaquin>>> 3/4  2/28 vanc>>>off  2/28 Ceftaz>>>    Subjective  Feels well.Still some occasional nausea but no other issues Patient seen on dialysis unit.  No blurred vision, double vision,fever  Objective    Interim History: All notes reviewed in detail.  Objective: Filed Vitals:   09/20/11 2010 09/20/11 2118 09/21/11 0540 09/21/11 0900  BP:  138/68 149/75 150/62  Pulse: 88 70 74 73  Temp:  98.1 F (36.7 C) 97.9 F (36.6 C) 98.7 F (37.1 C)  TempSrc:  Oral Oral Oral  Resp: 18 18 18 18   Height:      Weight:      SpO2:  99% 100% 100%    Intake/Output Summary (Last 24 hours) at 09/21/11 1518 Last data filed at 09/21/11 1215  Gross per 24 hour  Intake    360 ml  Output   1975 ml  Net  -1615 ml    Exam:  General: Pleasant African American female, frail appearing but smiling.  Right internal jugular access present Cardiovascular: S1-S2 no murmur rub or gallop.  Slightly irregular rhythm Respiratory: Clinically clear Abdomen: Soft nontender nondistended Skin intact with a drain on the right thigh and some swelling right thigh right leg Neuro grossly intact  Data Reviewed: Basic Metabolic Panel:  Lab [id number] 09/20/11 0535 09/19/11 0518 09/18/11 0535 09/17/11 0500  NA 139 138 138 138 140  K 3.6 3.5 -- -- --  CL 103 102 102 101 103  CO2 24 25 27 24 24   GLUCOSE 96 92 86 96 84  BUN 41* 36* 27* 64* 55*  CREATININE 5.61* 4.89* 3.84* 6.97* 6.46*  CALCIUM 13.1* 11.5* 10.6* 11.7* 11.0*  MG -- -- -- -- --  PHOS 3.4 3.3 2.9 4.8* 5.2*   Liver Function Tests:  Lab 09/21/11 [id number] 09/20/11 0535 09/19/11 0518 09/18/11 0535 09/17/11 0500  AST -- -- -- -- --  ALT -- -- -- -- --  ALKPHOS -- -- -- -- --  BILITOT -- -- -- -- --  PROT -- -- -- -- --  ALBUMIN 2.1* 2.0* 1.9* 1.9* 1.9*   No results found for this basename: LIPASE:5,AMYLASE:5 in the last 168 hours No results found for this basename: AMMONIA:5 in the last 168 hours CBC:  Lab 09/21/11 0638 09/20/11 0535 09/19/11 1830 09/19/11 0518 09/18/11 0535  WBC 13.0* 12.6* 14.8* 14.0* 17.1*  NEUTROABS -- -- 9.3* -- --  HGB 8.8* 8.1* 8.4* 8.3* 8.1*  HCT 26.3* 24.0* 25.1* 24.8* 23.9*  MCV 86.5 85.1 84.2 84.6 85.7  PLT 217 199 197  200 209   Cardiac Enzymes: No results found for this basename: CKTOTAL:5,CKMB:5,CKMBINDEX:5,TROPONINI:5 in the last 168 hours BNP: No components found with this basename: POCBNP:5 CBG:  Lab 09/21/11 0709 09/20/11 1638 09/20/11 1307 09/20/11 0801 09/20/11 0537  GLUCAP 99 123* 123* 96 97    Recent Results (from the past 240 hour(s))  CULTURE, RESPIRATORY     Status: Normal   Collection Time   09/12/11  6:53 PM      Component Value Range Status Comment   Specimen Description TRACHEAL ASPIRATE   Final    Special Requests NONE   Final    Gram Stain     Final    Value: NO WBC SEEN     MODERATE SQUAMOUS EPITHELIAL CELLS PRESENT     RARE GRAM POSITIVE COCCI IN PAIRS    Culture Non-Pathogenic Oropharyngeal-type Flora Isolated.   Final    Report Status 09/14/2011 FINAL   Final      Studies: Dg Chest 1 View  09/10/2011  *RADIOLOGY REPORT*  Clinical Data: Altered mental status.  CHEST - 1 VIEW  Comparison: 09/04/2011  Findings: The patient has a right-sided PICC line, tip to the superior vena cava.  The heart is enlarged.  There is dense opacity at the left lung base which partially obscures the lateral hemidiaphragm.  Findings are consistent with infectious infiltrate. No evidence for pulmonary edema.  No evidence for pneumothorax. There is degenerative change in the spine.  IMPRESSION:  1.  Cardiomegaly without pulmonary edema. 2.  Left lower lobe infiltrate.  Original Report Authenticated By: Glenice Bow, M.D.   Ct Head Wo Contrast  09/12/2011  *RADIOLOGY REPORT*  Clinical Data: 66 year old female with decreased level of consciousness.  CT HEAD WITHOUT CONTRAST  Technique:  Contiguous axial images were obtained from the base of the skull through the vertex without contrast.  Comparison: 09/10/2011 and earlier.  Findings: Mild bubbly opacity in the right sphenoid.  Other Visualized paranasal sinuses and mastoids are clear.  The patient is intubated.  No acute scalp or orbit soft tissue findings. No acute osseous abnormality identified.  Stable cerebral volume.  No ventriculomegaly. Stable gray-white matter differentiation throughout the brain.  No midline shift, mass effect, or evidence of mass lesion.  No acute intracranial hemorrhage identified.  No evidence of cortically based acute infarction identified.  No suspicious intracranial vascular hyperdensity.  IMPRESSION: 1.  Stable noncontrast CT appearance the brain without acute intracranial abnormality. 2.  Mild paranasal sinus inflammatory changes in the setting of intubation.  Original Report Authenticated By: Randall An, M.D.   Ct Head Wo Contrast  09/10/2011  *RADIOLOGY REPORT*  Clinical Data:  Confusion, weakness, previous stroke.  CT HEAD WITHOUT CONTRAST  Technique:  Contiguous axial images were obtained from the base of the skull through the vertex without contrast.  Comparison: 09/23/2008  Findings: The previously noted right thalamic hemorrhage has resolved. Atherosclerotic and physiologic intracranial calcifications.  Mild atrophy . There is no evidence of acute intracranial hemorrhage, brain edema, mass lesion, acute infarction,   mass effect, or midline shift. Acute infarct may be inapparent on noncontrast CT.  No other intra-axial abnormalities are seen, and the ventricles and sulci are within normal limits in size and symmetry.   No abnormal extra-axial fluid collections or masses are identified.  No significant calvarial abnormality.  IMPRESSION: 1. Negative for bleed or other acute intracranial process.  Original Report Authenticated By: Trecia Rogers, M.D.   US Abdomen Complete  09/11/2011  *  RADIOLOGY REPORT*  Clinical Data:  Elevated INR, liver disease, morbid obesity, prior cholecystectomy  ULTRASOUND ABDOMEN:  Technique:  Sonography of upper abdominal structures was performed. Examination limited by body habitus and bowel gas.  Comparison:  Renal ultrasound 09/03/2006  Gallbladder:  Surgically absent  Common bile duct:  8 mm diameter, may be normal post cholecystectomy  Liver:  Normal appearance  IVC:  Normal appearance  Pancreas:  Suboptimally visualized due to bowel gas, with portions of head and tail obscured.  Visualized portions normal appearance.  Spleen:  Normal appearance, 5.9 cm length  Right kidney:  10.8 cm length.  Increased cortical echogenicity. Small cyst 1.1 x 1.2 x 1.0 cm.  No gross hydronephrosis or shadowing calcification.  Left kidney:  Suboptimally visualized.  Approximately 10.7 cm length.  Marked cortical thinning.  Increased cortical echogenicity.  No gross mass or hydronephrosis seen on limited assessment.  Aorta:  Proximally normal caliber, remainder  obscured by bowel gas.  Other:  No free fluid  IMPRESSION: Medical renal disease changes of both kidneys with cortical atrophy of left kidney. Post cholecystectomy with slightly dilated common bile duct 8 mm diameter, which may be physiologic; recommend correlation with LFTs. Incomplete visualization of pancreas, aorta and left kidney.  Original Report Authenticated By: Burnetta Sabin, M.D.   Dg Chest Port 1 View  09/16/2011  *RADIOLOGY REPORT*  Clinical Data: Respiratory failure  PORTABLE CHEST - 1 VIEW  Comparison:   the previous day's study  Findings: Right IJ central line and left to right arm PICC remain in place.  The patient has been extubated and the nasogastric tube removed.  Stable cardiomegaly.  Cannot exclude small left pleural effusion.  Slightly improved aeration with concomitant decrease in perihilar and bibasilar atelectasis or interstitial infiltrates.  IMPRESSION:  1.  Extubation with slightly improved aeration.  Original Report Authenticated By: Trecia Rogers, M.D.   Dg Chest Port 1 View  09/15/2011  *RADIOLOGY REPORT*  Clinical Data: Intubated  PORTABLE CHEST - 1 VIEW  Comparison:   the previous day's study  Findings: Endotracheal tube, nasogastric tube, and right IJ central line are stable in position.  Stable cardiomegaly.  Relatively low lung volumes with persistent perihilar and bibasilar atelectasis or interstitial infiltrates.  Cannot exclude small left pleural effusion.  IMPRESSION:  1.  Little change from previous day's portable exam.  Original Report Authenticated By: Trecia Rogers, M.D.   Dg Chest Port 1 View  09/14/2011  *RADIOLOGY REPORT*  Clinical Data: Follow up of infiltrate.  Shortness of breath. Morbid obesity.  PORTABLE CHEST - 1 VIEW  Comparison: 1 day prior  Findings: Endotracheal tube is unchanged, with tip at the level of the clavicles.  Nasogastric tube extends beyond the inferior aspect of the film.  Right IJ central line unchanged.  Mild cardiomegaly.   No right pleural effusion.  Cannot exclude small left pleural effusion. No pneumothorax.  Low lung volumes. Interval development of mild pulmonary venous congestion.  Mild bibasilar volume loss.  IMPRESSION: Cardiomegaly and low lung volumes with mild pulmonary venous congestion.  Similar patchy bibasilar atelectasis.  Possible small left pleural effusion.  Original Report Authenticated By: Areta Haber, M.D.   Dg Chest Port 1 View  09/13/2011  *RADIOLOGY REPORT*  Clinical Data: Endotracheal tube.  PORTABLE CHEST - 1 VIEW  Comparison: 09/12/2011.  Findings: Endotracheal tube is in satisfactory position. Nasogastric tube is followed into the stomach, with the tip projecting beyond the inferior boundary of the film.  Right IJ central line and right PICC tips project over the SVC.  Heart is at the upper limits of normal in size, stable.  Left perihilar and right infrahilar air space disease persists.  Question left pleural effusion.  IMPRESSION: Left perihilar and right basilar airspace disease persist. Probable left pleural effusion.  Original Report Authenticated By: Luretha Rued, M.D.   Dg Chest Port 1 View  09/12/2011  *RADIOLOGY REPORT*  Clinical Data: Respiratory failure and status post intubation.  PORTABLE CHEST - 1 VIEW  Comparison: Film at 1030 hours.  Findings: Endotracheal tube present with the tip approximately 2.5 cm above the carina.  Stable positioning of temporary dialysis catheter with the tip in the lower SVC.  Additional right-sided PICC line tip continues to lie at the level of the proximal SVC. Nasogastric tube continues to extend into the stomach.  Lungs show stable cardiomegaly and left lower lobe atelectasis.  No overt edema present.  No evidence of pneumothorax.  No significant pleural fluid.  IMPRESSION: Endotracheal tube appropriately positioned with tip 2.5 cm above the carina.  Otherwise no significant change in appearance of the chest.  Original Report Authenticated By:  Azzie Roup, M.D.   Dg Chest Port 1 View  09/12/2011  *RADIOLOGY REPORT*  Clinical Data: Line placement.  PORTABLE CHEST - 1 VIEW  Comparison: 09/12/2011  Findings: Right central line placement.  The tip is in the SVC.  No pneumothorax.  Right PICC line is unchanged.  Cardiomegaly.  Mild vascular congestion. No effusions.  Probable left basilar opacity, unchanged.  No overt edema.  IMPRESSION: Right central line tip in the SVC.  No pneumothorax.  Otherwise no change.  Original Report Authenticated By: Raelyn Number, M.D.   Dg Chest Port 1 View  09/12/2011  *RADIOLOGY REPORT*  Clinical Data: Edema, renal failure  PORTABLE CHEST - 1 VIEW  Comparison: 09/10/2011  Findings: Cardiomegaly again noted.  No pulmonary edema.  Stable right arm PICC line position with tip in upper SVC.  Stable left basilar atelectasis or infiltrate.  IMPRESSION: No pulmonary edema.  Again noted left basilar atelectasis or infiltrate.  Original Report Authenticated By: Lahoma Crocker, M.D.   Dg Chest Port 1 View  09/04/2011  *RADIOLOGY REPORT*  Clinical Data: For central line placement  PORTABLE CHEST - 1 VIEW  Comparison: Portable chest x-ray of 11/21 for a 1012  Findings: The right upper extremity PICC line tip is in the mid lower SVC.  No pneumothorax is seen.  Cardiomegaly is stable.  A lap band is present.  IMPRESSION: Right PICC line tip in mid lower SVC.  No pneumothorax.  Original Report Authenticated By: Joretta Bachelor, M.D.    Scheduled Meds:    . calcitonin  100 Units Subcutaneous Daily  . cefTAZidime (FORTAZ)  IV  2 g Intravenous Q48H  . darbepoetin (ARANESP) injection - NON-DIALYSIS  200 mcg Subcutaneous Q Thu-1800  . diltiazem  240 mg Oral Daily  . metoprolol tartrate  50 mg Oral BID  . potassium chloride  30 mEq Oral Once  . sotalol  80 mg Oral Q48H   Continuous Infusions:    . sodium chloride Stopped (09/17/11 1700)     Assessment/Plan: 1. Acute on chronic respiratory failure, history of  asthma-was on prednisone until 09/17/11.  No current issues and stable 2. Encephalopathy-resolved, was secondary to possible renal insufficiency.issues. Currently mentating well and seems to be a baseline 3. 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 and antibiotic beads placed and 3/12-had washout 3/2 with non-purulence and seroma-ID has consulted and have recommended 6 weeks total of antibiotics finishing 10/16/2011-will need to continue ceftaz time every 48 hourly with renal dosing.  Appreciate pharmacy assistance 4. Acute renal insufficiency on CKD. stage III-output 2.9 L yesterday with dialysis. Prior to that was oliguric with -400 cc. Cause remains undefined but could be light chain disease causing this.  Ddx includes ATN, AIN, Vancomycin toxicity-her ANA was 1:40-DS-DNA Pending. 5. Hypercalcemia-calcium 13 this morning despite calcitonin and restriction. Serum Lambda/Kappa elevated to 16 and 14 respectively-free lambda light chain 17.2,Kappa/ lambda ratio 2.57, ee kappa light chain 44.2.  Spoke with Dr. Marin Olp of Oncology who recommends doing a 24-hour urine protein electrophoresis as the UPEP specimen was a spot urine initially.  Will pursue a skeletal survey and appreciate his putemia-ABLA/Iron deficiency-her baseline seems to be between 8 and 9.  transfused this admission. We will transfuse if her hemoglobin drops below 7 repeat labs in the morning.  Currently on Aranesp. 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 Leukocytosis-likely secondary to margination from steroids.  As no fever would not workup any further present. 7. Atrial fibrillation-alternating with normal sinus with 1st degree AVB at present rates in the low 100s. change diltiazem from 60 mg 4 times a day to Diltiazem 240, Toprol 50 mg twice a day,  sotalol 80 mg every 48 hourly. 8. Obstructive sleep apnea-continue CPAP 9. Hypertension-blood  pressures not controlled. Could uptitrate control with Norvasc 5 mg in a.m. if need  Code Status: Full Family Communication: Discussed with husband was in room Disposition Plan: Disposition depends on whether she needs dialysis or not. Will defer to judgment with nephrology regarding when to discharge as she may need intermittent dialysis.  Verneita Griffes, MD  [facility] Pager [phone] 09/21/2011, 3:18 PM    LOS: 11 days

## 2011-09-21 NOTE — Progress Notes (Signed)
[id number]. Lab called reCa 13.1. DR. Samtani aware no new orders.

## 2011-09-21 NOTE — Progress Notes (Signed)
Subjective: Interval History:  No new events Wound vac in place 6 weeks of ATB's planned for hip infection  Calcitonin started Objective:  Vital signs in last 24 hours:  Temp:  [97.6 F (36.4 C)-98.7 F (37.1 C)] 98.7 F (37.1 C) (03/08 0900) Pulse Rate:  [70-90] 73  (03/08 0900) Resp:  [18] 18  (03/08 0900) BP: (138-160)/(62-76) 150/62 mmHg (03/08 0900) SpO2:  [99 %-100 %] 100 % (03/08 0900) Weight change:   Intake/Output: I/O last 3 completed shifts: In: 710 [P.O.:710] Out: 1925 [Urine:1925]  Intake/Output this shift:  Total I/O In: 240 [P.O.:240] Out: 475 [Drains:475]  Scheduled Meds Reviewed: . calcitonin  100 Units Subcutaneous Daily  . cefTAZidime (FORTAZ)  IV  2 g Intravenous Q48H  . darbepoetin (ARANESP) injection - NON-DIALYSIS  200 mcg Subcutaneous Q Thu-1800  . diltiazem  240 mg Oral Daily  . metoprolol tartrate  50 mg Oral BID  . sotalol  80 mg Oral Q48H   EXAM: Awake, alert, NAD Wound VAC right hip Lungs clear Abd soft RLE with 2+ edema, left with trace Foley with clear urine Lab Results:  Basename 09/21/11 0638 09/20/11 0535 09/19/11 1830  WBC 13.0* 12.6* 14.8*  HGB 8.8* 8.1* 8.4*  HCT 26.3* 24.0* 25.1*  PLT 217 199 197   BMET  Basename 09/21/11 0638 09/20/11 0535 09/19/11 0518  NA 139 138 138  K 3.6 3.5 3.7  CL 103 102 102  CO2 24 25 27   GLUCOSE 96 92 86  BUN 41* 36* 27*  CREATININE 5.61* 4.89* 3.84*  CALCIUM 13.1* 11.5* 10.6*  PHOS 3.4 3.3 2.9   Assessment/Plan:  1. Acute on CRF (CKD baseline creat 2.3),- non-oliguric (as of yet unclear cause). UA shows mild microhemautria, prot 100 (has had chronically). 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  Non-oliguric. SPEP, UPEP pending; C3 slightly low; ANA low titer positive; C4 normal; still requiring intermittent HD; reassess daily; no  indication today; may need renal biopsy but with ongoing hip infection immunosuppression would be risky.  Although non-oliguric creatinine rising and calcium high despite calcitonin so will HD today.  If she is still requiring HD next week will need to change temp cath for tunnelled cath. 2. Hypercalcemia-I have no idea why; Vit D, PTH, PTH-RP, all normal; SPEP, UPEP pending; onset around 2/21 when looking back in records; seems excessive for just bone remodeling but not sure what else could be causing; will rx with calcitonin and dialysis today with 30 minutes of zero calcium bath 3. 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 Alvan Dame with sterile seroma; wound VAC in place 4. HTN  5. Anemia- ABLA and acute illness with CKD at baseline, transfuse as needed; Aranesp started; iron OK 6. Gout on allopurinol, reduced dose is appropriate  7. Resolved PNA 8. Volume- improved edema LE's post HD      LOS: 11 Damante Spragg B @TODAY @2 :15 PM

## 2011-09-21 NOTE — Progress Notes (Signed)
09/21/2011 [place], [place] Case Management Note [phone]  Utilization review completed.

## 2011-09-21 NOTE — Progress Notes (Signed)
OT Cancellation Note  Treatment cancelled today due to patient receiving procedure or test. Going for hemodialysis. Did give the pt level 1 theraband for her to work with over the weekend.  Almon Register [id number] 09/21/2011, 4:25 PM

## 2011-09-21 NOTE — Procedures (Signed)
I haver personally supervise pt's dialysis treatment.  Initiated via temp cath.  O calcium bath for 30 min then 2 Ca.  Since 2Ca bath is also 2K and potassiumless than 4, will give po [facility]

## 2011-09-21 NOTE — Progress Notes (Signed)
Placed patient on CPAP at 8cm. Sp02=95%. Patient instructed on how to remove mask if she felt like she might vomit. Will continue to monitor patient

## 2011-09-22 LAB — RENAL FUNCTION PANEL
Albumin: 2.1 g/dL — ABNORMAL LOW (ref 3.5–5.2)
BUN: 18 mg/dL (ref 6–23)
CO2: 28 mEq/L (ref 19–32)
Calcium: 10.4 mg/dL (ref 8.4–10.5)
Chloride: 103 mEq/L (ref 96–112)
Creatinine, Ser: 3.31 mg/dL — ABNORMAL HIGH (ref 0.50–1.10)
GFR calc Af Amer: 16 mL/min — ABNORMAL LOW (ref >90)
GFR calc non Af Amer: 14 mL/min — ABNORMAL LOW (ref >90)
Glucose, Bld: 91 mg/dL (ref 70–99)
Phosphorus: 2.3 mg/dL (ref 2.3–4.6)
Potassium: 3.2 mEq/L — ABNORMAL LOW (ref 3.5–5.1)
Sodium: 140 mEq/L (ref 135–145)

## 2011-09-22 LAB — CALCIUM, IONIZED: Calcium, Ion: 1.45 mmol/L — ABNORMAL HIGH (ref 1.12–1.32)

## 2011-09-22 LAB — ANTI-DNA ANTIBODY, DOUBLE-STRANDED: ds DNA Ab: 14 IU/mL (ref <30)

## 2011-09-22 MED ORDER — AMLODIPINE BESYLATE 5 MG PO TABS
5.0000 mg | ORAL_TABLET | Freq: Every day | ORAL | Status: DC
  Administered 2011-09-22 – 2011-09-30 (×9): 5 mg via ORAL
  Filled 2011-09-22 (×11): qty 1

## 2011-09-22 MED ORDER — ONDANSETRON HCL 4 MG PO TABS: 4.0000 mg | ORAL_TABLET | Freq: Three times a day (TID) | ORAL | Status: DC | PRN

## 2011-09-22 MED ORDER — DEXTROSE 5 % IV SOLN
2.0000 g | INTRAVENOUS | Status: DC
  Administered 2011-09-24 – 2011-09-30 (×4): 2 g via INTRAVENOUS
  Filled 2011-09-22 (×7): qty 2

## 2011-09-22 MED ORDER — AMLODIPINE 1 MG/ML ORAL SUSPENSION
5.0000 mg | Freq: Every day | ORAL | Status: DC
  Filled 2011-09-22 (×2): qty 5

## 2011-09-22 MED ORDER — CEFTAZIDIME 2 G IJ SOLR
2.0000 g | INTRAMUSCULAR | Status: DC
  Administered 2011-09-22: 2 g via INTRAVENOUS
  Filled 2011-09-22: qty 2

## 2011-09-22 NOTE — Progress Notes (Addendum)
PROGRESS NOTE  [name] [id number] DOB: 04-10-1946 DOA: 09/10/2011 PCP: Garnet Koyanagi, DO, DO  Brief narrative: 66 yo female admit 2/21 with confusion/AMS and found to be in AKI with creat 5.1 (after 2/13 admit for R hip infection breakdown)  Past medical history: Depression, stress fractures both feet, intracerebral hemorrhage 2009, gout, asthma, sleep apnea, anemia, arthritis, atrial fibrillation [not on Coumadin], cardiomyopathy  Consultants:  Pulmonary critical care  Nephrology  Infectious disease  Procedures: 2/25 CT HEAD>>>Negative for bleed or other acute intracranial process  2/27 Head CT >>>Stable noncontrast CT appearance the brain without acute intracranial abnormality.  2/28 Ortho called  3/1 remains awake alert, poor output  3/2 to OR for I&D R hip, placement abx beads, removal non purulent seroma  2/28 2D echo>>> EF 50-55%, mild MR, cannot exclude vegetation MV, mod TR, PA press 13mmHg, No obvious vegetation 3/9 Skeletal survery=no lytic/suspicious findings for Myeloma   Antibiotics: 2/25 Maxipime>>>off  2/26 Rocephin>> 2/26  2/25 Levaquin>>> 3/4  2/28 vanc>>>off  2/28 Ceftaz>>>    Subjective  Feels well.  Has some malaise in her stomach, more like "bubbling".  No N/V Her Urinary catheter was leaking and had to be changed.     Objective    Interim History: All notes reviewed in detail.  Objective: Filed Vitals:   09/21/11 1910 09/21/11 1923 09/21/11 2005 09/22/11 0403  BP: 158/88 148/86 144/68 126/60  Pulse: 69 71 71 77  Temp:  97.2 F (36.2 C) 98.1 F (36.7 C) 97.9 F (36.6 C)  TempSrc:  Oral Oral Oral  Resp: 18 19 18 18   Height:  5\' 5"  (1.651 m)    Weight:  108.5 kg (239 lb 3.2 oz)    SpO2:   95% 98%    Intake/Output Summary (Last 24 hours) at 09/22/11 [id number] Last data filed at 09/21/11 2006  Gross per 24 hour  Intake    650 ml  Output   1376 ml  Net   -726 ml    Exam:  General: Pleasant African American female, frail  appearing but smiling.  Right internal jugular access present Cardiovascular: S1-S2 no murmur rub or gallop. Slightly irregular rhythm Respiratory: Clinically clear Abdomen: Soft nontender nondistended Skin intact with a drain on the right thigh and some swelling right thigh right leg Neuro grossly intact  Data Reviewed: Basic Metabolic Panel:  Lab [id number] 09/21/11 0638 09/20/11 0535 09/19/11 0518 09/18/11 0535  NA 140 139 138 138 138  K 3.2* 3.6 -- -- --  CL 103 103 102 102 101  CO2 28 24 25 27 24   GLUCOSE 91 96 92 86 96  BUN 18 41* 36* 27* 64*  CREATININE 3.31* 5.61* 4.89* 3.84* 6.97*  CALCIUM 10.4 13.1* 11.5* 10.6* 11.7*  MG -- -- -- -- --  PHOS 2.3 3.4 3.3 2.9 4.8*   Liver Function Tests:  Lab 09/22/11 0500 09/21/11 [id number] 09/20/11 0535 09/19/11 0518 09/18/11 0535  AST -- -- -- -- --  ALT -- -- -- -- --  ALKPHOS -- -- -- -- --  BILITOT -- -- -- -- --  PROT -- -- -- -- --  ALBUMIN 2.1* 2.1* 2.0* 1.9* 1.9*   No results found for this basename: LIPASE:5,AMYLASE:5 in the last 168 hours No results found for this basename: AMMONIA:5 in the last 168 hours CBC:  Lab 09/21/11 0638 09/20/11 0535 09/19/11 1830 09/19/11 0518 09/18/11 0535  WBC 13.0* 12.6* 14.8* 14.0* 17.1*  NEUTROABS -- -- 9.3* -- --  HGB  8.8* 8.1* 8.4* 8.3* 8.1*  HCT 26.3* 24.0* 25.1* 24.8* 23.9*  MCV 86.5 85.1 84.2 84.6 85.7  PLT 217 199 197 200 209   Cardiac Enzymes: No results found for this basename: CKTOTAL:5,CKMB:5,CKMBINDEX:5,TROPONINI:5 in the last 168 hours BNP: No components found with this basename: POCBNP:5 CBG:  Lab 09/21/11 0709 09/20/11 1638 09/20/11 1307 09/20/11 0801 09/20/11 0537  GLUCAP 99 123* 123* 96 97    Recent Results (from the past 240 hour(s))  CULTURE, RESPIRATORY     Status: Normal   Collection Time   09/12/11  6:53 PM      Component Value Range Status Comment   Specimen Description TRACHEAL ASPIRATE   Final    Special Requests NONE   Final    Gram Stain     Final      Value: NO WBC SEEN     MODERATE SQUAMOUS EPITHELIAL CELLS PRESENT     RARE GRAM POSITIVE COCCI IN PAIRS   Culture Non-Pathogenic Oropharyngeal-type Flora Isolated.   Final    Report Status 09/14/2011 FINAL   Final      Studies:    Scheduled Meds:    . calcitonin  100 Units Subcutaneous Daily  . cefTAZidime (FORTAZ)  IV  2 g Intravenous Q24H  . darbepoetin (ARANESP) injection - NON-DIALYSIS  200 mcg Subcutaneous Q Thu-1800  . diltiazem  240 mg Oral Daily  . metoprolol tartrate  50 mg Oral BID  . potassium chloride  30 mEq Oral Once  . sotalol  80 mg Oral Q48H  . DISCONTD: cefTAZidime (FORTAZ)  IV  2 g Intravenous Q48H   Continuous Infusions:    . sodium chloride Stopped (09/17/11 1700)     Assessment/Plan: 1. Acute on chronic respiratory failure, history of asthma-was on prednisone until 09/17/11.  No current issues and stable 2. Encephalopathy-resolved, was secondary to possible renal insufficiency.issues. Currently mentating well and seems to be a baseline 3. 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 and antibiotic beads placed and 3/12-had washout 3/2 with non-purulence and seroma-ID has consulted and have recommended 6 weeks total of antibiotics finishing 10/16/2011-will need to continue ceftaz time every 48 hourly with renal dosing.  Appreciate pharmacy assistance.  Wound Vac changed 3/9 and will be changed again 3/11 4. Acute renal insufficiency on CKD. stage III-output 1.5L yesterday with dialysis. Prior to that was oliguric with -400 cc. Cause remains undefined but could be light chain disease causing this?  Ddx includes ATN, AIN, Vancomycin toxicity-her ANA was 1:40-DS-DNA Pending. 5. Hypercalcemia-calcium dropped to 10.4.. Serum Lambda/Kappa elevated to 16 and 14 respectively-free lambda light chain 17.2,Kappa/ lambda ratio 2.57, ee kappa light chain 44.2.  Spoke with Dr. Marin Olp of Oncology who recommends  doing a 24-hour urine protein electrophoresis as the UPEP specimen was a spot urine initially. Skeletal survey done negative for Myleoma.  Briefly explained to her that she will need hematologist to help work this up--understands we still  have pending lab work before a diagnosis can be made 6. ABLA/Iron deficiency-her baseline seems to be between 8 and 9.  transfused this admission. We will transfuse if her hemoglobin drops below 7 repeat labs in the morning.  Currently on Aranesp. Likely cause for anemia would be either MGUS, other myeloma variant 7. Leukocytosis-likely secondary to margination from steroids.  As no fever would not workup any further present-could be relatedd to Heme anomaly that might be causing her Hypercalcemia. 8. Atrial fibrillation-alternating  with normal sinus with 1st degree AVB at present rates in the low 100s. Diltiazem 240, Toprol 50 mg twice a day,  sotalol 80 mg every 48 hourly-not on coumadin. 9. Obstructive sleep apnea-continue CPAP 10. Hypertension-blood pressures not controlled.  Norvasc 5 mg added  Code Status: Full Family Communication: non at bedisde at present Disposition Plan: Disposition depends on whether she needs dialysis or not. Will defer to judgment with nephrology regarding when to discharge as she may need intermittent dialysis.  Verneita Griffes, MD  [facility] Pager [phone] 09/22/2011, 9:27 AM    LOS: 12 days

## 2011-09-22 NOTE — Progress Notes (Signed)
Patient ID: LYANDRA MCPHAIL, female   DOB: 1946/07/11, 66 y.o.   MRN: [id number]  S: The patient reports to be feeling well ("I seem to be getting better day by day") and tolerated dialysis without any acute events. Anticipating a change of her Foley catheter today.  O:BP 123/61  Pulse 74  Temp(Src) 97.6 F (36.4 C) (Oral)  Resp 16  Ht 5\' 5"  (1.651 m)  Wt 108.5 kg (239 lb 3.2 oz)  BMI 39.80 kg/m2  SpO2 99%  Intake/Output Summary (Last 24 hours) at 09/22/11 1113 Last data filed at 09/22/11 0914  Gross per 24 hour  Intake    770 ml  Output   1526 ml  Net   -756 ml   Weight change:  Gen: Comfortable resting in bed, talking on the telephone CVS: Pulse regular in rate and rhythm, heart sounds S1 and S2 are normal Resp: Clear to auscultation bilaterally, no rales/rhonchi Abd: Soft, obese, nontender and bowel sounds are normal Ext: 1-2+ pitting edema bilaterally      . amLODipine  5 mg Oral Daily  . calcitonin  100 Units Subcutaneous Daily  . cefTAZidime (FORTAZ)  IV  2 g Intravenous Q24H  . darbepoetin (ARANESP) injection - NON-DIALYSIS  200 mcg Subcutaneous Q Thu-1800  . diltiazem  240 mg Oral Daily  . metoprolol tartrate  50 mg Oral BID  . potassium chloride  30 mEq Oral Once  . sotalol  80 mg Oral Q48H  . DISCONTD: cefTAZidime (FORTAZ)  IV  2 g Intravenous Q48H   Dg Bone Survey Met  09/22/2011  *RADIOLOGY REPORT*  Clinical Data: Bone pain.  Right hip pain.  Clinical concern for multiple myeloma.  METASTATIC BONE SURVEY  Comparison: Previous related examinations.  Findings:  Lateral skull:  No lytic lesions.  AP and lateral cervical spine:  Multilevel degenerative changes. Mild scoliosis.  Mild reversal of the normal cervical lordosis.  No lytic lesions.  AP and lateral thoracic spine:  Multilevel degenerative changes, including changes of DISH.  No lytic lesions.  AP and lateral lumbar spine:  Mild levoconvex rotary scoliosis. Multilevel degenerative changes.  No lytic lesions.  AP  pelvis:  Absence of the proximal right femur with extensive soft tissue calcific density suggesting methylmethacrylate.  No lytic lesions.  AP femurs:  Absence of the proximal right femur with screw holes in the remaining midportion of the right femur.  Extensive calcific density in the soft tissues compatible with methylmethacrylate. Unremarkable proximal left femur.  No lytic lesions.  AP humeri:  Normal, without lytic lesions.  AP shoulders:  Mild bilateral acromioclavicular spur formation.  No lytic lesions.  IMPRESSION: No lytic lesions or other findings suspicious for multiple myeloma.  Original Report Authenticated By: Gerald Stabs, M.D.   BMET  Lab 09/22/11 0500 09/21/11 [id number] 09/20/11 0535 09/19/11 0518 09/18/11 0535 09/17/11 0500 09/16/11 0500  NA 140 139 138 138 138 140 141  K 3.2* 3.6 3.5 3.7 3.8 4.0 4.0  CL 103 103 102 102 101 103 105  CO2 28 24 25 27 24 24 25   GLUCOSE 91 96 92 86 96 84 78  BUN 18 41* 36* 27* 64* 55* 46*  CREATININE 3.31* 5.61* 4.89* 3.84* 6.97* 6.46* 5.71*  ALB -- -- -- -- -- -- --  CALCIUM 10.4 13.1* 11.5* 10.6* 11.7* 11.0* 9.9  PHOS 2.3 3.4 3.3 2.9 4.8* 5.2* 4.9*   CBC  Lab 09/21/11 0638 09/20/11 0535 09/19/11 1830 09/19/11 0518  WBC 13.0* 12.6* 14.8*  14.0*  NEUTROABS -- -- 9.3* --  HGB 8.8* 8.1* 8.4* 8.3*  HCT 26.3* 24.0* 25.1* 24.8*  MCV 86.5 85.1 84.2 84.6  PLT 217 199 197 200    Assessment/Plan:  1. Acute on CRF (CKD baseline creat 2.3),- non-oliguric (as of yet unclear cause but suspected to be nephrotoxic ATN-vancomycin). UA shows mild microhemautria, prot 100 (has had chronically). 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 Non-oliguric. SPEP, UPEP pending; C3 slightly low; ANA low titer positive; C4 normal; still requiring intermittent HD; reassess daily; no indication today; may need renal  biopsy but with ongoing hip infection immunosuppression would be risky. Although non-oliguric creatinine rising and calcium high despite calcitonin so will HD today. If she is still requiring HD, next week will need to change temp cath for tunnelled cath. Reassuring to note that she has good urine output however will need to reassess daily for dialysis needs. Recovery of renal clearance function may lag behind improvement of urinary output. 2. Hypercalcemia-suspected likely from immobilization in this previously active woman with a high body mass index/bone mass. Ruling out the possibility of plasma cell dyscrasia as given marginal free light chain assay as well as unexplained proteinuria/hypoalbuminemia; Vit D, PTH, PTH-RP, all normal; SPEP, UPEP pending. Treated with calcitonin to avoid hemodynamic complications of hypercalcemia. We'll discontinue calcitonin when corrected calcium is less than 10. Currently no indications for bisphosphonates which would be best avoided if nephrotoxic ATN is suspected. 3. 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 Alvan Dame with sterile seroma; wound VAC in place 4. HTN  5. Anemia- ABLA and acute illness with CKD at baseline, transfuse as needed; Aranesp started; iron OK 6. Gout on allopurinol, reduced dose is appropriate  7. Resolved PNA 8. Volume- improved edema LE's post HD  [employer] K.

## 2011-09-23 LAB — CBC
HCT: 26.7 % — ABNORMAL LOW (ref 36.0–46.0)
Hemoglobin: 8.9 g/dL — ABNORMAL LOW (ref 12.0–15.0)
MCH: 29.1 pg (ref 26.0–34.0)
MCHC: 33.3 g/dL (ref 30.0–36.0)
MCV: 87.3 fL (ref 78.0–100.0)
Platelets: 205 10*3/uL (ref 150–400)
RBC: 3.06 MIL/uL — ABNORMAL LOW (ref 3.87–5.11)
RDW: 17 % — ABNORMAL HIGH (ref 11.5–15.5)
WBC: 12.5 10*3/uL — ABNORMAL HIGH (ref 4.0–10.5)

## 2011-09-23 LAB — RENAL FUNCTION PANEL
Albumin: 2.2 g/dL — ABNORMAL LOW (ref 3.5–5.2)
BUN: 22 mg/dL (ref 6–23)
CO2: 26 mEq/L (ref 19–32)
Calcium: 11.2 mg/dL — ABNORMAL HIGH (ref 8.4–10.5)
Chloride: 102 mEq/L (ref 96–112)
Creatinine, Ser: 4.29 mg/dL — ABNORMAL HIGH (ref 0.50–1.10)
GFR calc Af Amer: 12 mL/min — ABNORMAL LOW (ref >90)
GFR calc non Af Amer: 10 mL/min — ABNORMAL LOW (ref >90)
Glucose, Bld: 86 mg/dL (ref 70–99)
Phosphorus: 3 mg/dL (ref 2.3–4.6)
Potassium: 3.1 mEq/L — ABNORMAL LOW (ref 3.5–5.1)
Sodium: 139 mEq/L (ref 135–145)

## 2011-09-23 LAB — PTH-RELATED PEPTIDE: PTH-related peptide: 31 pg/mL — ABNORMAL HIGH (ref 14–27)

## 2011-09-23 MED ORDER — CALCITONIN (SALMON) 200 UNIT/ML IJ SOLN
100.0000 [IU] | Freq: Two times a day (BID) | INTRAMUSCULAR | Status: AC
  Administered 2011-09-23: 100 [IU] via SUBCUTANEOUS
  Administered 2011-09-23: 12:00:00 via SUBCUTANEOUS
  Administered 2011-09-24 (×2): 100 [IU] via SUBCUTANEOUS
  Filled 2011-09-23 (×4): qty 0.5

## 2011-09-23 MED ORDER — HEPARIN SODIUM (PORCINE) 1000 UNIT/ML DIALYSIS
40.0000 [IU]/kg | INTRAMUSCULAR | Status: DC | PRN
  Filled 2011-09-23: qty 5

## 2011-09-23 NOTE — Progress Notes (Signed)
Patient ID: [name], female   DOB: 03/13/1946, 66 y.o.   MRN: [id number]  S: The patient reports to be feeling better today-denies any chest pain, shortness of breath, nausea or vomiting. Had a restful night.  O:BP 138/65  Pulse 70  Temp(Src) 98.7 F (37.1 C) (Oral)  Resp 18  Ht 5\' 5"  (1.651 m)  Wt 108.5 kg (239 lb 3.2 oz)  BMI 39.80 kg/m2  SpO2 96%  Intake/Output Summary (Last 24 hours) at 09/23/11 1011 Last data filed at 09/23/11 0700  Gross per 24 hour  Intake    360 ml  Output    400 ml  Net    -40 ml   Weight change: -1.7 kg (-3 lb 12 oz) [id number] resting in bed, reading from a book  [id number] regular in rate and rhythm, heart sounds S1 and S2 normal  Resp:Diminished breath sounds bibasally, no rales/rhonchi  [id number], obese, nontender and bowel sounds are normal  Ext: 2+ lower extremity edema bilaterally      . amLODipine  5 mg Oral Daily  . calcitonin  100 Units Subcutaneous Daily  . cefTAZidime (FORTAZ)  IV  2 g Intravenous Q48H  . darbepoetin (ARANESP) injection - NON-DIALYSIS  200 mcg Subcutaneous Q Thu-1800  . diltiazem  240 mg Oral Daily  . metoprolol tartrate  50 mg Oral BID  . sotalol  80 mg Oral Q48H  . DISCONTD: amLODipine  5 mg Oral Daily  . DISCONTD: cefTAZidime (FORTAZ)  IV  2 g Intravenous Q24H   Dg Bone Survey Met  09/22/2011  *RADIOLOGY REPORT*  Clinical Data: Bone pain.  Right hip pain.  Clinical concern for multiple myeloma.  METASTATIC BONE SURVEY  Comparison: Previous related examinations.  Findings:  Lateral skull:  No lytic lesions.  AP and lateral cervical spine:  Multilevel degenerative changes. Mild scoliosis.  Mild reversal of the normal cervical lordosis.  No lytic lesions.  AP and lateral thoracic spine:  Multilevel degenerative changes, including changes of DISH.  No lytic lesions.  AP and lateral lumbar spine:  Mild levoconvex rotary scoliosis. Multilevel degenerative changes.  No lytic lesions.  AP pelvis:  Absence of the  proximal right femur with extensive soft tissue calcific density suggesting methylmethacrylate.  No lytic lesions.  AP femurs:  Absence of the proximal right femur with screw holes in the remaining midportion of the right femur.  Extensive calcific density in the soft tissues compatible with methylmethacrylate. Unremarkable proximal left femur.  No lytic lesions.  AP humeri:  Normal, without lytic lesions.  AP shoulders:  Mild bilateral acromioclavicular spur formation.  No lytic lesions.  IMPRESSION: No lytic lesions or other findings suspicious for multiple myeloma.  Original Report Authenticated By: Gerald Stabs, M.D.   BMET  Lab 09/23/11 0500 09/22/11 0500 09/21/11 [id number] 09/20/11 0535 09/19/11 0518 09/18/11 0535 09/17/11 0500  NA 139 140 139 138 138 138 140  K 3.1* 3.2* 3.6 3.5 3.7 3.8 4.0  CL 102 103 103 102 102 101 103  CO2 26 28 24 25 27 24 24   GLUCOSE 86 91 96 92 86 96 84  BUN 22 18 41* 36* 27* 64* 55*  CREATININE 4.29* 3.31* 5.61* 4.89* 3.84* 6.97* 6.46*  ALB -- -- -- -- -- -- --  CALCIUM 11.2* 10.4 13.1* 11.5* 10.6* 11.7* 11.0*  PHOS 3.0 2.3 3.4 3.3 2.9 4.8* 5.2*   CBC  Lab 09/23/11 0500 09/21/11 0638 09/20/11 0535 09/19/11 1830  WBC 12.5* 13.0* 12.6* 14.8*  NEUTROABS -- -- --  9.3*  HGB 8.9* 8.8* 8.1* 8.4*  HCT 26.7* 26.3* 24.0* 25.1*  MCV 87.3 86.5 85.1 84.2  PLT 205 217 199 197     Assessment/Plan:  1. Acute on CRF (CKD baseline creat 2.3),- non-oliguric (as of yet unclear cause but suspected to be nephrotoxic ATN-vancomycin). UA shows mild microhemautria, prot 100 (has had chronically). 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 She is barely non-oliguric and still requiring intermittent HD; we'll reassess daily for dialysis indications and preemptively place on the schedule for tomorrow. If she is still requiring HD, next  week will need to change temp cath for tunnelled cath. Reassuring to note that she has good urine output however will need to reassess daily for dialysis needs. Recovery of renal clearance function may lag behind improvement of urinary output. Currently, it would be difficult to justify a renal biopsy as a means for diagnosis/prognosis given ongoing issues.  2. Hypercalcemia-suspected likely from immobilization in this previously active woman with a high body mass index/bone mass. Ruling out the possibility of plasma cell dyscrasia as given marginal free light chain assay as well as unexplained proteinuria/hypoalbuminemia; Vit D, PTH, PTH-RP, all normal; SPEP, UPEP pending. Treated with calcitonin to avoid hemodynamic complications of hypercalcemia- will adjust dose of the calcitonin given the recommended dose is 4 units per kilogram. Currently no indications for bisphosphonates which would be best avoided if nephrotoxic ATN is suspected. 3. 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 Alvan Dame with sterile seroma; wound VAC in place 4. HTN  5. Anemia- ABLA and acute illness with CKD at baseline, transfuse as needed; Aranesp started; iron OK 6. Gout on allopurinol, reduced dose is appropriate  7. Resolved PNA 8. Volume- improved edema LE's post HD  Deniz Hannan K.

## 2011-09-23 NOTE — Progress Notes (Signed)
Patient ID: [name], female   DOB: [birth year], 66 y.o.   MRN: [id number] Subjective: 8 Days Post-Op Procedure(s) (LRB): IRRIGATION AND DEBRIDEMENT EXTREMITY (Right)    Patient reports pain as mild.  But minimal activity and none out of bed since admission  Objective:   VITALS:   Filed Vitals:   09/23/11 0443  BP: 138/65  Pulse: 70  Temp: 98.7 F (37.1 C)  Resp: 18    Neurovascular intact Incision: dressing C/D/I Wound vac still functioning well with full canister of serosanguinous output, remainder of wound clean, no drainage, no erythema   LABS  Basename 09/23/11 0500 09/21/11 0638  HGB 8.9* 8.8*  HCT 26.7* 26.3*  WBC 12.5* 13.0*  PLT 205 217     Basename 09/23/11 0500 09/22/11 0500 09/21/11 0638  NA 139 140 139  K 3.1* 3.2* 3.6  BUN 22 18 41*  CREATININE 4.29* 3.31* 5.61*  GLUCOSE 86 91 96    No results found for this basename: LABPT:2,INR:2 in the last 72 hours   Assessment/Plan: 8 Days Post-Op Procedure(s) (LRB): IRRIGATION AND DEBRIDEMENT EXTREMITY (Right)   Up with therapy Continue ABX therapy due to Culture taken of surgical site during joint revision  Continued to be monitored for acute on chronic renal failure, Cr continues to be elevated but maybe trending down Will need wound vac at d/c as long as continues to function and pull off serous thigh fluid  IVABX for 6 weeks to treat right hip infection, should remain on them while wound vac in place as well or which ever is longer

## 2011-09-23 NOTE — Progress Notes (Signed)
PROGRESS NOTE  [name] [id number] DOB: 09/05/1945 DOA: 09/10/2011 PCP: Garnet Koyanagi, DO, DO  Brief narrative: 66 yo female admit 2/21 with confusion/AMS and found to be in AKI with creat 5.1 (after 2/13 admit for R hip infection breakdown)  Past medical history: Depression, stress fractures both feet, intracerebral hemorrhage 2009, gout, asthma, sleep apnea, anemia, arthritis, atrial fibrillation [not on Coumadin], cardiomyopathy  Consultants:  Pulmonary critical care  Nephrology  Infectious disease  Procedures: 2/25 CT HEAD>>>Negative for bleed or other acute intracranial process  2/27 Head CT >>>Stable noncontrast CT appearance the brain without acute intracranial abnormality.  2/28 Ortho called  3/1 remains awake alert, poor output  3/2 to OR for I&D R hip, placement abx beads, removal non purulent seroma  2/28 2D echo>>> EF 50-55%, mild MR, cannot exclude vegetation MV, mod TR, PA press 7mmHg, No obvious vegetation 3/9 Skeletal survery=no lytic/suspicious findings for Myeloma   Antibiotics: 2/25 Maxipime>>>off  2/26 Rocephin>> 2/26  2/25 Levaquin>>> 3/4  2/28 vanc>>>off  2/28 Ceftaz>>>    Subjective  Feels well. Her Urinary catheter was leaking and had to be changed.  Family in room Likely to have dialysis tomorrow     Objective    Interim History: All notes reviewed in detail.  Objective: Filed Vitals:   09/23/11 0314 09/23/11 0443 09/23/11 0900 09/23/11 1358  BP:  138/65 111/63 155/71  Pulse:  70 76 78  Temp:  98.7 F (37.1 C) 98 F (36.7 C) 97.5 F (36.4 C)  TempSrc:  Oral Oral Oral  Resp:  18 17 18   Height: 5\' 5"  (1.651 m)     Weight: 108.5 kg (239 lb 3.2 oz)     SpO2:  96% 96% 97%    Intake/Output Summary (Last 24 hours) at 09/23/11 1505 Last data filed at 09/23/11 1300  Gross per 24 hour  Intake    720 ml  Output    400 ml  Net    320 ml    Exam:  General: Pleasant African American female, frail appearing but smiling.  Right  internal jugular access present Cardiovascular: S1-S2 no murmur rub or gallop. Slightly irregular rhythm Respiratory: Clinically clear Abdomen: Soft nontender nondistended, obese Skin intact with a drain on the right thigh and some swelling right thigh right leg-Wound vac in Place RLE Neuro grossly intact  Data Reviewed: Basic Metabolic Panel:  Lab [id number] 0500 09/22/11 0500 09/21/11 0638 09/20/11 0535 09/19/11 0518  NA 139 140 139 138 138  K 3.1* 3.2* -- -- --  CL 102 103 103 102 102  CO2 26 28 24 25 27   GLUCOSE 86 91 96 92 86  BUN 22 18 41* 36* 27*  CREATININE 4.29* 3.31* 5.61* 4.89* 3.84*  CALCIUM 11.2* 10.4 13.1* 11.5* 10.6*  MG -- -- -- -- --  PHOS 3.0 2.3 3.4 3.3 2.9   Liver Function Tests:  Lab 09/23/11 0500 09/22/11 0500 09/21/11 0638 09/20/11 0535 09/19/11 0518  AST -- -- -- -- --  ALT -- -- -- -- --  ALKPHOS -- -- -- -- --  BILITOT -- -- -- -- --  PROT -- -- -- -- --  ALBUMIN 2.2* 2.1* 2.1* 2.0* 1.9*   No results found for this basename: LIPASE:5,AMYLASE:5 in the last 168 hours No results found for this basename: AMMONIA:5 in the last 168 hours CBC:  Lab 09/23/11 0500 09/21/11 0638 09/20/11 0535 09/19/11 1830 09/19/11 0518  WBC 12.5* 13.0* 12.6* 14.8* 14.0*  NEUTROABS -- -- -- 9.3* --  HGB 8.9* 8.8* 8.1* 8.4* 8.3*  HCT 26.7* 26.3* 24.0* 25.1* 24.8*  MCV 87.3 86.5 85.1 84.2 84.6  PLT 205 217 199 197 200   Cardiac Enzymes: No results found for this basename: CKTOTAL:5,CKMB:5,CKMBINDEX:5,TROPONINI:5 in the last 168 hours BNP: No components found with this basename: POCBNP:5 CBG:  Lab 09/21/11 0709 09/20/11 1638 09/20/11 1307 09/20/11 0801 09/20/11 0537  GLUCAP 99 123* 123* 96 97    No results found for this or any previous visit (from the past 240 hour(s)).   Studies:    Scheduled Meds:    . amLODipine  5 mg Oral Daily  . calcitonin  100 Units Subcutaneous BID  . cefTAZidime (FORTAZ)  IV  2 g Intravenous Q48H  . darbepoetin (ARANESP) injection  - NON-DIALYSIS  200 mcg Subcutaneous Q Thu-1800  . diltiazem  240 mg Oral Daily  . metoprolol tartrate  50 mg Oral BID  . sotalol  80 mg Oral Q48H   Continuous Infusions:    . sodium chloride Stopped (09/17/11 1700)     Assessment/Plan: 1. Acute on chronic respiratory failure, history of asthma-was on prednisone until 09/17/11.  No current issues and stable 2. Encephalopathy-resolved, was secondary to possible renal insufficiency.issues. Currently mentating well and seems to be a baseline 3. 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 and antibiotic beads placed and 3/12-had washout 3/2 with non-purulence and seroma-ID has consulted and have recommended 6 weeks total of antibiotics finishing 10/16/2011-will need to continue ceftaz time every 48 hourly with renal dosing.  Appreciate pharmacy assistance.  Wound Vac changed 3/9 and will be changed again 3/11 4. Acute renal insufficiency on CKD. stage III-output 1.5L yesterday with dialysis. Prior to that was oliguric with -400 cc. Cause remains undefined-not inclined to suspect Light chain disease-More likely Vancomycin toxicity-Await 24 hours Urine collection for Light chains and will re-discuss with Dr. Marin Olp who I spoke with about her case 3/08. Ddx includes ATN, AIN, Vancomycin toxicity-her ANA was 1:40-DS-DNA Pending. 5. Hypercalcemia-calcium 11.4.. Serum Lambda/Kappa elevated to 16 and 14 respectively-free lambda light chain 17.2,Kappa/ lambda ratio 2.57, ee kappa light chain 44.2.  Oncology recommended doing a 24-hour urine protein electrophoresis as the UPEP specimen was a spot urine initially. Skeletal survey done negative for Myleoma.  -understands we still  have pending lab work before a diagnosis can be made.  Continue Calcitonin 100 Units bid im 6. ABLA/Iron deficiency-her baseline seems to be between 8 and 9.  transfused this admission. We will transfuse if her hemoglobin drops  below 7 repeat labs in the morning.  Currently on Aranesp. Likely cause for anemia could be either MGUS, other myeloma variant vs AOCD 7. Leukocytosis-likely secondary to margination from steroids.  As no fever would not workup any further present-could be related to Heme anomaly that might be causing her Hypercalcemia. 8. Atrial fibrillation-alternating with normal sinus with 1st degree AVB at present rates in the low 100s. Diltiazem 240, Toprol 50 mg twice a day,  sotalol 80 mg every 48 hourly-not on coumadin. 9. Obstructive sleep apnea-continue CPAP 10. Hypertension-blood pressures not controlled.  Norvasc 5 mg added  Code Status: Full Family Communication: nlong discussion with Husband/daughter sin room.  Explained I am unsure as to prognosis and need for long term dialysis.  Have requested Dr. Posey Pronto of nephrology to weight in with regards to ultimate POC. Disposition Plan: Disposition depends on whether she needs dialysis or not. Will defer to judgment with  nephrology regarding when to discharge as she may need intermittent dialysis.  Verneita Griffes, MD  [facility] Pager [phone] 09/23/2011, 3:05 PM    LOS: 13 days

## 2011-09-23 NOTE — Progress Notes (Signed)
Pt placed on CPAP at previous settings and tolerating well. RT will continue to monitor.

## 2011-09-24 ENCOUNTER — Inpatient Hospital Stay (HOSPITAL_COMMUNITY): Payer: Medicare Other

## 2011-09-24 DIAGNOSIS — N186 End stage renal disease: Secondary | ICD-10-CM

## 2011-09-24 LAB — CBC
HCT: 28.6 % — ABNORMAL LOW (ref 36.0–46.0)
Hemoglobin: 9.5 g/dL — ABNORMAL LOW (ref 12.0–15.0)
MCH: 28.8 pg (ref 26.0–34.0)
MCHC: 33.2 g/dL (ref 30.0–36.0)
MCV: 86.7 fL (ref 78.0–100.0)
Platelets: 244 10*3/uL (ref 150–400)
RBC: 3.3 MIL/uL — ABNORMAL LOW (ref 3.87–5.11)
RDW: 17 % — ABNORMAL HIGH (ref 11.5–15.5)
WBC: 11.6 10*3/uL — ABNORMAL HIGH (ref 4.0–10.5)

## 2011-09-24 LAB — RENAL FUNCTION PANEL
Albumin: 2.3 g/dL — ABNORMAL LOW (ref 3.5–5.2)
BUN: 27 mg/dL — ABNORMAL HIGH (ref 6–23)
CO2: 24 mEq/L (ref 19–32)
Calcium: 12.3 mg/dL — ABNORMAL HIGH (ref 8.4–10.5)
Chloride: 101 mEq/L (ref 96–112)
Creatinine, Ser: 5.44 mg/dL — ABNORMAL HIGH (ref 0.50–1.10)
GFR calc Af Amer: 9 mL/min — ABNORMAL LOW (ref >90)
GFR calc non Af Amer: 8 mL/min — ABNORMAL LOW (ref >90)
Glucose, Bld: 89 mg/dL (ref 70–99)
Phosphorus: 3.5 mg/dL (ref 2.3–4.6)
Potassium: 3.3 mEq/L — ABNORMAL LOW (ref 3.5–5.1)
Sodium: 139 mEq/L (ref 135–145)

## 2011-09-24 NOTE — Progress Notes (Signed)
Notified IV team RN,Jess re: order to d/c Rt IJ temporary catheter. Jamil Armwood Joselita,RN

## 2011-09-24 NOTE — Consult Note (Signed)
[facility] [place] [facility]       Consult Note  History of Present Illness:  Patient is a 66 y.o. year old female who presents for placement of a permanent hemodialysis access. The patient is right handed.  She has a PICC line in her right arm.  The patient is currently on hemodialysis vja a temporary catheter in the right IJ.   The cause of renal failure is thought to be secondary to multifactorial.  Other chronic medical problems include recent hip infection on 6 weeks IV abx via PICC line.  She also has hypertension, asthma and prior stroke.  All of these are currently stable.  She has a VAC dressing on her right hip from recent I and D by Dr Alvan Dame.  Past Medical History  Diagnosis Date  . Hypertension   . Depression   . Fractures, stress     in both feet  . Kidney disease     pt states creatinine is high  . 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 Stroke   . Gout   . Asthma     MILD NOW-NO INHALERS  . Sleep apnea     USES CPAP  . Anemia   . Blood transfusion   . Arthritis     HIP  . Atrial fibrillation     PT TAKES ASPIRIN AS BLOOD THINNER-HX OF CEREBRAL BLEED WHILE ON COUMADIN  . Arrhythmia   . Cardiomyopathy   . Hypercalcemia     09/10/11  . HCAP (healthcare-associated pneumonia)     09/10/11    Past Surgical History  Procedure Date  . Cholecystectomy   . 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 Joint replacement     right hip and right femur x 2  . Right femur fracture x2     FRACTURES WERE AFTER AFTER RIGHT HIP REPLACEMENT  . I&d extremity 09/15/2011    Procedure: IRRIGATION AND DEBRIDEMENT EXTREMITY;  Surgeon: [name], MD;  Location: [facility];  Service: Orthopedics;  Laterality: Right;     Social History History  Substance Use Topics  . Smoking status: Never Smoker   . Smokeless tobacco: Never Used  . Alcohol Use: No    Family History Family History  Problem Relation Age of Onset  . Diabetes Mother   .  Hypertension Mother   . Hodgkin's lymphoma  32    dscd---HODGKINS DISEASE  . Cancer Father     Allergies  Allergies  Allergen Reactions  . Coumadin Other (See Comments)    Caused her to have a stroke     Current Facility-Administered Medications  Medication Dose Route Frequency Provider Last Rate Last Dose  . 0.9 %  sodium chloride infusion   Intravenous Continuous Raylene Miyamoto, MD      . amLODipine (NORVASC) tablet 5 mg  5 mg Oral Daily Nita Sells, MD   5 mg at 09/24/11 1526  . calcitonin (MIACALCIN) injection 100 Units  100 Units Subcutaneous BID Jay K. Posey Pronto, MD   100 Units at 09/24/11 1527  . cefTAZidime (FORTAZ) 2 g in dextrose 5 % 50 mL IVPB  2 g Intravenous Q48H Leland Her, PHARMD   2 g at 09/24/11 1532  . darbepoetin (ARANESP) injection 200 mcg  200 mcg Subcutaneous Q Thu-1800 Lucrezia Starch, MD   200 mcg at 09/20/11 1802  . diltiazem (CARDIZEM CD) 24 hr capsule 240 mg  240 mg  Oral Daily Nita Sells, MD   240 mg at 09/24/11 1527  . fentaNYL (SUBLIMAZE) injection 25-50 mcg  25-50 mcg Intravenous Q2H PRN Donita Brooks, NP   50 mcg at 09/17/11 2223  . heparin injection 1,000 Units  1,000 Units Dialysis PRN Sol Blazing, MD   2,400 Units at 09/21/11 1926  . heparin injection 2,500 Units  20 Units/kg Dialysis PRN Sol Blazing, MD      . heparin injection 2,500 Units  20 Units/kg Dialysis PRN Sol Blazing, MD   2,500 Units at 09/12/11 1230  . heparin injection 2,500 Units  20 Units/kg Dialysis PRN Donetta Potts, MD   2,500 Units at 09/24/11 0951  . heparin injection 4,300 Units  40 Units/kg Dialysis PRN Ulice Dash K. Posey Pronto, MD      . lidocaine (XYLOCAINE) 1 % injection 5 mL  5 mL Intradermal PRN Sol Blazing, MD      . lidocaine-prilocaine (EMLA) cream 1 application  1 application Topical PRN Sol Blazing, MD      . metoprolol (LOPRESSOR) tablet 50 mg  50 mg Oral BID Collene Gobble, MD   50 mg at 09/24/11 1527  . nystatin (NYSTOP)  topical powder   Topical TID PRN Jacquelyn A McGill, MD      . ondansetron (ZOFRAN) tablet 4 mg  4 mg Oral Q8H PRN Nita Sells, MD      . pentafluoroprop-tetrafluoroeth (GEBAUERS) aerosol 1 application  1 application Topical PRN Sol Blazing, MD      . sodium chloride 0.9 % injection 10-40 mL  10-40 mL Intracatheter PRN Hosie Poisson, MD   10 mL at 09/24/11 1806  . sotalol (BETAPACE) tablet 80 mg  80 mg Oral Q48H Cherene Altes, MD   80 mg at 09/22/11 2257    ROS:   General:  No weight loss, Fever, chills  HEENT: No recent headaches, no nasal bleeding, no visual changes, no sore throat  Neurologic: No dizziness, blackouts, seizures. No recent symptoms of stroke or mini- stroke. No recent episodes of slurred speech, or temporary blindness.  Cardiac: No recent episodes of chest pain/pressure, no shortness of breath at rest.  + shortness of breath with exertion.  Denies history of atrial fibrillation or irregular heartbeat  Vascular: No history of rest pain in feet.  No history of claudication.  No history of non-healing ulcer, No history of DVT   Pulmonary: No home oxygen, no hemoptysis,  No asthma or wheezing  Musculoskeletal:  [x ] Arthritis, [ ]  Low back pain,  [x ] Joint pain  Hematologic:No history of hypercoagulable state.  No history of easy bleeding.  + history of anemia  Gastrointestinal: No hematochezia or melena,  No gastroesophageal reflux, no trouble swallowing  Urinary: [x ] chronic Kidney disease, [x ] on HD - [ ]  MWF or [ ]  TTHS, [ ]  Burning with urination, [ ]  Frequent urination, [ ]  Difficulty urinating;   Skin: No rashes  Psychological: No history of anxiety,  No history of depression   Physical Examination  Filed Vitals:   09/24/11 1310 09/24/11 1340 09/24/11 1426 09/24/11 1649  BP: 150/74 143/60 152/90 139/73  Pulse: 76 80 84 79  Temp:    97.9 F (36.6 C)  TempSrc:    Oral  Resp: 22 24  20   Height:      Weight:  236 lb 15.9 oz (107.5 kg)     SpO2:  100% 100% 100%  Body mass index is 39.44 kg/(m^2).  General:  Alert and oriented, no acute distress HEENT: Normal Neck: No bruit or JVD Pulmonary: Clear to auscultation bilaterally Cardiac: Regular Rate and Rhythm without murmur Gastrointestinal: Soft, non-tender, non-distended, no mass, obese Skin: No rash Extremity Pulses:  2+ radial, brachial pulses bilaterally Musculoskeletal: No deformity or edema  Neurologic: Upper and lower extremity motor 5/5 and symmetric  DATA: Vein mapping pending   ASSESSMENT: Needs hemodialysis access.  Will try to place fistula in left arm based on vein mapping also with recent hip infection would be less likely to seed new graft.  Will need Diatek catheter simultaneously    PLAN:  Will follow up with final plan tomorrow hopefully after vein map complete  Ruta Hinds, MD [facility] [place]: [phone] Pager: [phone]

## 2011-09-24 NOTE — Progress Notes (Signed)
Placed patient on CPAP QHS at 8 cm H2O, full face mask and humidity.  Patient tolerating CPAP well.

## 2011-09-24 NOTE — Progress Notes (Addendum)
Patient ID: AIDEL BINGAMAN, female   DOB: July 16, 1946, 66 y.o.   MRN: [id number] Patient ID: KALISE LAMOUREAUX, female   DOB: [birth year], 66 y.o.   MRN: [id number]  S: No complaints.  UOP about 300-400 cc/d maximum.    O:BP 152/90  Pulse 84  Temp(Src) 98.1 F (36.7 C) (Oral)  Resp 24  Ht 5\' 5"  (1.651 m)  Wt 107.5 kg (236 lb 15.9 oz)  BMI 39.44 kg/m2  SpO2 100%  Intake/Output Summary (Last 24 hours) at 09/24/11 1622 Last data filed at 09/24/11 1340  Gross per 24 hour  Intake    300 ml  Output   1579 ml  Net  -1279 ml   Weight change: 0.3 kg (10.6 oz) [id number] resting in bed, reading from a book, looks good  [id number] regular in rate and rhythm, heart sounds S1 and S2 normal  Resp:Diminished breath sounds bibasally, no rales/rhonchi  [id number], obese, nontender and bowel sounds are normal  Ext: 1+ lower extremity edema bilaterally      . amLODipine  5 mg Oral Daily  . calcitonin  100 Units Subcutaneous BID  . cefTAZidime (FORTAZ)  IV  2 g Intravenous Q48H  . darbepoetin (ARANESP) injection - NON-DIALYSIS  200 mcg Subcutaneous Q Thu-1800  . diltiazem  240 mg Oral Daily  . metoprolol tartrate  50 mg Oral BID  . sotalol  80 mg Oral Q48H   No results found. BMET  Lab 09/24/11 0930 09/23/11 0500 09/22/11 0500 09/21/11 0638 09/20/11 0535 09/19/11 0518 09/18/11 0535  NA 139 139 140 139 138 138 138  K 3.3* 3.1* 3.2* 3.6 3.5 3.7 3.8  CL 101 102 103 103 102 102 101  CO2 24 26 28 24 25 27 24   GLUCOSE 89 86 91 96 92 86 96  BUN 27* 22 18 41* 36* 27* 64*  CREATININE 5.44* 4.29* 3.31* 5.61* 4.89* 3.84* 6.97*  ALB -- -- -- -- -- -- --  CALCIUM 12.3* 11.2* 10.4 13.1* 11.5* 10.6* 11.7*  PHOS 3.5 3.0 2.3 3.4 3.3 2.9 4.8*   CBC  Lab 09/24/11 0930 09/23/11 0500 09/21/11 0638 09/20/11 0535 09/19/11 1830  WBC 11.6* 12.5* 13.0* 12.6* --  NEUTROABS -- -- -- -- 9.3*  HGB 9.5* 8.9* 8.8* 8.1* --  HCT 28.6* 26.7* 26.3* 24.0* --  MCV 86.7 87.3 86.5 85.1 --  PLT 244 205 217 199 --      Assessment/Plan:  1. Acute on CRF (CKD baseline creat 2.3),- non-oliguric (as of yet unclear cause but suspected to be nephrotoxic ATN-vancomycin). UA shows mild microhemautria, prot 100 (has had chronically). 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 Known CKD with baseline GFR in 20's. Not a candidate for high-dose steroid therapy, therefore renal biopsy not justified.  Will remove temp HD cath and ask VVS to see for tunnelled catheter and permanent access later this week.  Order vein mapping for tomorrow. She had HD today, uneventful.   2. Hypercalcemia-suspected likely from immobilization in this previously active woman with a high body mass index/bone mass. Ruling out the possibility of plasma cell dyscrasia as given marginal free light chain assay as well as unexplained proteinuria/hypoalbuminemia; Vit D, PTH, PTH-RP, all normal; SPEP, UPEP pending. Treated with calcitonin to avoid hemodynamic complications of hypercalcemia- will adjust dose of the calcitonin given the recommended dose is 4 units per kilogram. Currently  no indications for bisphosphonates which would be best avoided if nephrotoxic ATN is suspected. 3. 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 Alvan Dame with sterile seroma; wound VAC in place 4. HTN  5. Anemia- ABLA and acute illness with CKD at baseline, transfuse as needed; Aranesp started; iron OK 6. Gout on allopurinol, reduced dose is appropriate  7. Resolved PNA 8. Volume- improved edema LE's post HD   Kelly Splinter  MD [facility] [phone] pgr    [phone] cell 09/24/2011, 4:38 PM

## 2011-09-24 NOTE — Progress Notes (Signed)
ANTIBIOTIC CONSULT NOTE - FOLLOW UP  Pharmacy Consult for ceftazaime Indication: pseudomonas prosthetic joint infection and pneumonia  Assessment: 74 YOF s/p recent removal of hip prosthesis and placement of antibiotic spacer.  Cultures grew pan-sensitive P. Aeruginosa.  Discharged on 2/21 and represented 2/25 with confusion from NH. Vanco/levofloxacin stopped following 6 days of tx.  Continues on fortaz 1gm IV q48h (Day #12). Pt is afebrile, WBC improving. Will continue to monitor and f/u HD plans with renal. Will continue current regimen.   Plan:  1. Continue Fortaz to 2 gm IV q48hrs, per ID plan to continue for total of 6 weeks - stop date 10/16/11.   2. Cont to monitor renal fx, HD plan, WBC, and tx plans  [place] Crane, Pharm D 09/24/2011 9:50 AM    Allergies  Allergen Reactions  . Coumadin Other (See Comments)    Caused her to have a stroke    Patient Measurements: Height: 5\' 5"  (165.1 cm) Weight: 236 lb 5.3 oz (107.2 kg) IBW/kg (Calculated) : 57  Adjusted Body Weight:   Vital Signs: Temp: 98.1 F (36.7 C) (03/11 0842) Temp src: Oral (03/11 0842) BP: 126/65 mmHg (03/11 0930) Pulse Rate: 76  (03/11 0930) Intake/Output from previous day: 03/10 0701 - 03/11 0700 In: 780 [P.O.:780] Out: 455 [Urine:300; Drains:155] Intake/Output from this shift:    Labs:  Basename 09/23/11 0500 09/22/11 0500  WBC 12.5* --  HGB 8.9* --  PLT 205 --  LABCREA -- --  CREATININE 4.29* 3.31*   Estimated Creatinine Clearance: 16.3 ml/min (by C-G formula based on Cr of 4.29). No results found for this basename: VANCOTROUGH:2,VANCOPEAK:2,VANCORANDOM:2,GENTTROUGH:2,GENTPEAK:2,GENTRANDOM:2,TOBRATROUGH:2,TOBRAPEAK:2,TOBRARND:2,AMIKACINPEAK:2,AMIKACINTROU:2,AMIKACIN:2, in the last 72 hours   Microbiology: Recent Results (from the past 720 hour(s))  SURGICAL PCR SCREEN     Status: Abnormal   Collection Time   08/28/11 10:33 AM      Component Value Range Status Comment   MRSA, PCR NEGATIVE   NEGATIVE  Final    Staphylococcus aureus POSITIVE (*) NEGATIVE  Final   GRAM STAIN     Status: Normal   Collection Time   09/03/11  8:08 AM      Component Value Range Status Comment   Specimen Description FLUID SYNOVIAL RIGHT HIP ON SWAB   Final    Special Requests NONE   Final    Gram Stain     Final    Value: ABUNDANT WBC PRESENT, PREDOMINANTLY PMN     NO ORGANISMS SEEN     Gram Stain Report Called to,Read Back By and Verified With:     DAVENPORT,S. RN AT [id number] ON 09/03/11 BY GILLESPIE,B.   Report Status 09/03/2011 FINAL   Final   BODY FLUID CULTURE     Status: Normal   Collection Time   09/03/11  8:09 AM      Component Value Range Status Comment   Specimen Description FLUID SYNOVIAL RIGHT HIP ON SWAB   Final    Special Requests NONE   Final    Gram Stain     Final    Value: ABUNDANT WBC PRESENT, PREDOMINANTLY PMN     NO ORGANISMS SEEN     Gram Stain Report Called to,Read Back By and Verified With: Gram Stain Report Called to,Read Back By and Verified With: Carolanne Grumbling RN @ [phone] ON 09/03/11 BY Ethel Rana B Performed by [facility]   Culture     Final    Value: RARE PSEUDOMONAS AERUGINOSA     Note: CRITICAL RESULT CALLED TO, READ BACK  BY AND VERIFIED WITH: KIMBERLY OWENS   Report Status 09/06/2011 FINAL   Final    Organism ID, Bacteria PSEUDOMONAS AERUGINOSA   Final   ANAEROBIC CULTURE     Status: Normal   Collection Time   09/03/11  8:10 AM      Component Value Range Status Comment   Specimen Description FLUID SYNOVIAL RIGHT HIP   Final    Special Requests NONE   Final    Gram Stain     Final    Value: NO WBC SEEN     NO SQUAMOUS EPITHELIAL CELLS SEEN     NO ORGANISMS SEEN   Culture NO ANAEROBES ISOLATED   Final    Report Status 09/07/2011 FINAL   Final   CULTURE, BLOOD (ROUTINE X 2)     Status: Normal   Collection Time   09/10/11  7:55 PM      Component Value Range Status Comment   Specimen Description BLOOD   Final    Special Requests BOTTLES DRAWN AEROBIC AND  ANAEROBIC 3CC   Final    Culture  Setup Time [id number]   Final    Culture NO GROWTH 5 DAYS   Final    Report Status 09/17/2011 FINAL   Final   CULTURE, BLOOD (ROUTINE X 2)     Status: Normal   Collection Time   09/10/11  8:00 PM      Component Value Range Status Comment   Specimen Description BLOOD LEFT ARM   Final    Special Requests BOTTLES DRAWN AEROBIC AND ANAEROBIC 4CC   Final    Culture  Setup Time [id number]   Final    Culture NO GROWTH 5 DAYS   Final    Report Status 09/17/2011 FINAL   Final   URINE CULTURE     Status: Normal   Collection Time   09/10/11 10:53 PM      Component Value Range Status Comment   Specimen Description URINE, CATHETERIZED   Final    Special Requests NONE   Final    Culture  Setup Time [id number]   Final    Colony Count NO GROWTH   Final    Culture NO GROWTH   Final    Report Status 09/12/2011 FINAL   Final   MRSA PCR SCREENING     Status: Normal   Collection Time   09/11/11 12:48 AM      Component Value Range Status Comment   MRSA by PCR NEGATIVE  NEGATIVE  Final   CULTURE, RESPIRATORY     Status: Normal   Collection Time   09/12/11  6:53 PM      Component Value Range Status Comment   Specimen Description TRACHEAL ASPIRATE   Final    Special Requests NONE   Final    Gram Stain     Final    Value: NO WBC SEEN     MODERATE SQUAMOUS EPITHELIAL CELLS PRESENT     RARE GRAM POSITIVE COCCI IN PAIRS   Culture Non-Pathogenic Oropharyngeal-type Flora Isolated.   Final    Report Status 09/14/2011 FINAL   Final     Anti-infectives     Start     Dose/Rate Route Frequency Ordered Stop   09/24/11 1200   cefTAZidime (FORTAZ) 2 g in dextrose 5 % 50 mL IVPB        2 g 100 mL/hr over 30 Minutes Intravenous Every 48 hours 09/22/11 1235     09/22/11 1200  cefTAZidime (FORTAZ) 2 g in dextrose 5 % 50 mL IVPB  Status:  Discontinued        2 g 100 mL/hr over 30 Minutes Intravenous Every 24 hours 09/22/11 0750 09/22/11 1235   09/21/11 1200   cefTAZidime  (FORTAZ) 2 g in dextrose 5 % 50 mL IVPB  Status:  Discontinued        2 g 100 mL/hr over 30 Minutes Intravenous Every 48 hours 09/19/11 1347 09/22/11 0750   09/20/11 1200   cefTAZidime (FORTAZ) 2 g in dextrose 5 % 50 mL IVPB  Status:  Discontinued        2 g 100 mL/hr over 30 Minutes Intravenous Every 24 hours 09/19/11 1329 09/19/11 1346   09/15/11 1110   tobramycin (NEBCIN) powder  Status:  Discontinued          As needed 09/15/11 1117 09/15/11 1135   09/15/11 1110   vancomycin (VANCOCIN) powder  Status:  Discontinued          As needed 09/15/11 1118 09/15/11 1135   09/15/11 1015   vancomycin (VANCOCIN) powder 2,000 mg  Status:  Discontinued        2,000 mg Other To Surgery 09/15/11 1009 09/16/11 1018   09/15/11 1015   tobramycin (NEBCIN) powder 1.2 g  Status:  Discontinued        1.2 g Topical To Surgery 09/15/11 1010 09/16/11 1018   09/15/11 1015   tobramycin (NEBCIN) powder 1.2 g  Status:  Discontinued        1.2 g Topical To Surgery 09/15/11 1010 09/16/11 1018   09/13/11 1100   cefTAZidime (FORTAZ) 2 g in dextrose 5 % 50 mL IVPB  Status:  Discontinued        2 g 100 mL/hr over 30 Minutes Intravenous Every 48 hours 09/13/11 1016 09/19/11 1329   09/12/11 2200   levofloxacin (LEVAQUIN) IVPB 500 mg  Status:  Discontinued        500 mg 100 mL/hr over 60 Minutes Intravenous Every 48 hours 09/11/11 0042 09/17/11 1250   09/12/11 0200   ceFEPIme (MAXIPIME) 1 g in dextrose 5 % 50 mL IVPB  Status:  Discontinued        1 g 100 mL/hr over 30 Minutes Intravenous Every 24 hours 09/11/11 1003 09/13/11 0851   09/11/11 0045   cefTRIAXone (ROCEPHIN) 1 g in dextrose 5 % 50 mL IVPB  Status:  Discontinued        1 g 100 mL/hr over 30 Minutes Intravenous Daily at bedtime 09/11/11 0017 09/11/11 1003   09/10/11 2300   vancomycin (VANCOCIN) 2,000 mg in sodium chloride 0.9 % 500 mL IVPB        2,000 mg 250 mL/hr over 120 Minutes Intravenous  Once 09/10/11 2200 09/11/11 0513   09/10/11 2145    ceFEPIme (MAXIPIME) 1 g in dextrose 5 % 50 mL IVPB        1 g 100 mL/hr over 30 Minutes Intravenous  Once 09/10/11 2138 09/11/11 0241   09/10/11 2145   Levofloxacin (LEVAQUIN) IVPB 750 mg        750 mg 100 mL/hr over 90 Minutes Intravenous  Once 09/10/11 2138 09/10/11 2323

## 2011-09-24 NOTE — Progress Notes (Signed)
OT Cancellation Note  Treatment cancelled today due to pt. at HD will re-attempt as time allows.  Samamtha Tiegs, OTR/L Pager [phone] 09/24/2011, 1:44 PM

## 2011-09-24 NOTE — Progress Notes (Addendum)
PROGRESS NOTE  [name] [id number] DOB: 1946/01/12 DOA: 09/10/2011 PCP: Garnet Koyanagi, DO, DO  Brief narrative: 66 yo female admit 2/25 to [facility] with confusion/AMS and found to be in AKI with creat 5.1 (after 2/13 admit for R hip infection breakdown)-she said she was "talking out of her head". She had no chronic chills fever nausea or vomiting. She did complain of some diarrhea and has reported decreased urine output. On admission her creatinine was 5.16 and at baseline is 2.3. After the first dialysis 2/26 patient became obtunded had myoclonic seizures and had altered mental status and was noted that she was having very shallow inspiration expiration and on the critical care was consulted. She ultimately was intubated and kept on critical care service until 09/18/11. She currently is being worked up for the cause of her kidney failure. Most of the workup so far has been negative although the thought is that she has possible drug induced nephritis--she had antibiotic beads placed a spacer is in her hips and these were combined with tobramycin as well as vancomycin which could be a possible cause a 24-hour UPEP was done and I discussed the findings of this with oncologist Dr. Marin Olp who thinks it is highly unlikely that she has myeloma. Her being on steroids precludes renal biopsy and preparations were being made to place a tunneled catheter and possibly look for dialysis access-she currently is being set up at the dialysis center and likely will have a permanent fistula placed this Friday 3/15 and likely should be able to be discharged subsequent to that. It is unclear at this time whether explantation of the spacer would be something to do and the patient will need discussion of this once vancomycin and tobramycin levels return.   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  2/28 Ortho called  3/1 remains awake alert, poor output  3/2 to OR for I&D R hip, placement abx beads, removal non purulent seroma  2/28 2D echo>>> EF 50-55%, mild MR, cannot exclude vegetation MV, mod TR, PA press 81mmHg, No obvious vegetation 3/9 Skeletal survery=no lytic/suspicious findings for Myeloma   Antibiotics: 2/25 Maxipime>>>off  2/26 Rocephin>> 2/26  2/25 Levaquin>>> 3/4  2/28 vanc>>>off  2/28 Ceftaz>>>    Subjective  Feels well.   Has spoken with nephrologist and understands semipermanent dialysis needs Husband at bedside   Objective    Interim History: All notes reviewed in detail.  Objective: Filed Vitals:   09/24/11 1310 09/24/11 1340 09/24/11 1426 09/24/11 1649  BP: 150/74 143/60 152/90 139/73  Pulse: 76 80 84 79  Temp:    97.9 F (36.6 C)  TempSrc:    Oral  Resp: 22 24  20   Height:      Weight:  107.5 kg (236 lb 15.9 oz)    SpO2:  100% 100% 100%    Intake/Output Summary (Last 24 hours) at 09/24/11 1752 Last data filed at 09/24/11 1340  Gross per 24 hour  Intake     60 ml  Output   1504 ml  Net  -1444 ml    Exam:  General: Pleasant African American female, frail appearing but smiling.  Right internal jugular access present Cardiovascular: S1-S2 no murmur rub or gallop. Slightly irregular rhythm Respiratory: Clinically clear Abdomen: Soft nontender nondistended, obese Skin intact with a drain on the right thigh and some swelling right thigh right leg-Wound vac in Place RLE Neuro grossly intact  Data Reviewed: Basic Metabolic Panel:  Lab [id number] 09/23/11 0500 09/22/11 0500 09/21/11 0638 09/20/11 0535  NA 139 139 140 139  138  K 3.3* 3.1* -- -- --  CL 101 102 103 103 102  CO2 24 26 28 24 25   GLUCOSE 89 86 91 96 92  BUN 27* 22 18 41* 36*  CREATININE 5.44* 4.29* 3.31* 5.61* 4.89*  CALCIUM 12.3* 11.2* 10.4 13.1* 11.5*  MG -- -- -- -- --  PHOS 3.5 3.0 2.3 3.4 3.3   Liver Function Tests:  Lab 09/24/11 0930 09/23/11 0500 09/22/11 0500 09/21/11 0638 09/20/11 0535  AST -- -- -- -- --  ALT -- -- -- -- --  ALKPHOS -- -- -- -- --  BILITOT -- -- -- -- --  PROT -- -- -- -- --  ALBUMIN 2.3* 2.2* 2.1* 2.1* 2.0*   No results found for this basename: LIPASE:5,AMYLASE:5 in the last 168 hours No results found for this basename: AMMONIA:5 in the last 168 hours CBC:  Lab 09/24/11 0930 09/23/11 0500 09/21/11 0638 09/20/11 0535 09/19/11 1830  WBC 11.6* 12.5* 13.0* 12.6* 14.8*  NEUTROABS -- -- -- -- 9.3*  HGB 9.5* 8.9* 8.8* 8.1* 8.4*  HCT 28.6* 26.7* 26.3* 24.0* 25.1*  MCV 86.7 87.3 86.5 85.1 84.2  PLT 244 205 217 199 197   Cardiac Enzymes: No results found for this basename: CKTOTAL:5,CKMB:5,CKMBINDEX:5,TROPONINI:5 in the last 168 hours BNP: No components found with this basename: POCBNP:5 CBG:  Lab 09/21/11 0709 09/20/11 1638 09/20/11 1307 09/20/11 0801 09/20/11 0537  GLUCAP 99 123* 123* 96 97    No results found for this or any previous visit (from the past 240 hour(s)).   Studies:    Scheduled Meds:    . amLODipine  5 mg Oral Daily  . calcitonin  100 Units Subcutaneous BID  . cefTAZidime (FORTAZ)  IV  2 g Intravenous Q48H  . darbepoetin (ARANESP) injection - NON-DIALYSIS  200 mcg Subcutaneous Q Thu-1800  . diltiazem  240 mg Oral Daily  . metoprolol tartrate  50 mg Oral BID  . sotalol  80 mg Oral Q48H   Continuous Infusions:    . sodium chloride Stopped (09/17/11 1700)     Assessment/Plan: 1. Acute on chronic respiratory failure, history of asthma-was on prednisone until 09/17/11.  No current issues and stable 2. Encephalopathy-resolved, was secondary to possible renal  insufficiency.issues. Currently mentating well and seems to be a baseline 3. 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 and antibiotic beads placed and 3/12-had washout 3/2 with non-purulence and seroma-ID has consulted and have recommended 6 weeks total of antibiotics finishing 10/16/2011-will need to continue ceftaz time every 48 hourly with renal dosing.  Appreciate pharmacy assistance. Orthopedics to detemine if Stimulan beads placed 3/2 need to be removed, once Vancomycin and tobramycin levels return 4. Acute renal insufficiency on CKD. stage III-output 300-400. Cause remains undefined-not inclined to suspect Light chain disease-More likely Vancomycin/Tobramycin toxicity. Ddx includes ATN, AIN, Vancomycin toxicity-her ANA was 1:40-DS-DNA was 14 and therefore negative. 5. Hypercalcemia-calcium 9.6, but prior to dilaysis yesterday was 12.3.  Oncology recommended doing a 24-hour urine protein electrophoresis  which was essentially  negative. Skeletal survey done negative for Myleoma.  Continue Calcitonin 100 Units bid im. Cannot use a bisphosphonate given that she has renal insufficiency  6. ABLA/Iron deficiency-her baseline seems to be between 8 and 9.  transfused this admission. We will transfuse if her hemoglobin drops below 7 repeat labs in the morning.  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 Leukocytosis-likely secondary to margination from steroids.  As no fever would not workup any further present-could be related to Heme anomaly that might be causing her Hypercalcemia. 8. Atrial fibrillation-alternating with normal sinus with 1st degree AVB at present rates in the low 100s. Diltiazem 240, Toprol 50 mg twice a day,  sotalol 80 mg every 48 hourly-not on coumadin. 9. Obstructive sleep apnea-continue CPAP 10. Hypertension-blood pressures moderately controlled.  Norvasc 5 mg added  Code Status: Full Family Communication:  Will discuss with family  in a.m. Disposition Plan: Disposition depends on whether she needs dialysis or not. Will defer to judgment with nephrology regarding when to discharge as she may need intermittent dialysis-after permanent catheter for dialysis .  Being set up with Dialysis and for permanent dialysis Family understands the course of care and full discussion undertaken with Nephrology-appreciate assitance  Verneita Griffes, MD  [facility] Pager [phone] 09/24/2011, 5:52 PM    LOS: 14 days

## 2011-09-24 NOTE — Progress Notes (Signed)
R temp IJ HD Cath removed per Dr Jonnie Finner order/tip intact/vaseline pressure gauze applied/pressure x5 min/no bleeding noted/floor RN aware. Lorri [name], RN

## 2011-09-24 NOTE — Progress Notes (Signed)
Dressing on rt neck area dry and intact,no bleeding noted.Will continue to monitor. Shelisha Gautier Joselita,RN

## 2011-09-25 LAB — RENAL FUNCTION PANEL
Albumin: 2.2 g/dL — ABNORMAL LOW (ref 3.5–5.2)
BUN: 10 mg/dL (ref 6–23)
CO2: 28 mEq/L (ref 19–32)
Calcium: 9.6 mg/dL (ref 8.4–10.5)
Chloride: 102 mEq/L (ref 96–112)
Creatinine, Ser: 2.87 mg/dL — ABNORMAL HIGH (ref 0.50–1.10)
GFR calc Af Amer: 19 mL/min — ABNORMAL LOW (ref >90)
GFR calc non Af Amer: 16 mL/min — ABNORMAL LOW (ref >90)
Glucose, Bld: 84 mg/dL (ref 70–99)
Phosphorus: 2 mg/dL — ABNORMAL LOW (ref 2.3–4.6)
Potassium: 3.4 mEq/L — ABNORMAL LOW (ref 3.5–5.1)
Sodium: 138 mEq/L (ref 135–145)

## 2011-09-25 LAB — UIFE/LIGHT CHAINS/TP QN, 24-HR UR
Albumin, U: DETECTED
Alpha 1, Urine: DETECTED — AB
Alpha 2, Urine: DETECTED — AB
Beta, Urine: DETECTED — AB
Free Kappa Lt Chains,Ur: 32.3 mg/dL — ABNORMAL HIGH (ref 0.14–2.42)
Free Kappa/Lambda Ratio: 1.44 ratio — ABNORMAL LOW (ref 2.04–10.37)
Free Lambda Excretion/Day: 90 mg/d
Free Lambda Lt Chains,Ur: 22.5 mg/dL — ABNORMAL HIGH (ref 0.02–0.67)
Free Lt Chn Excr Rate: 129.2 mg/d
Gamma Globulin, Urine: DETECTED — AB
Time: 24 h
Total Protein, Urine-Ur/day: 488 mg/d — ABNORMAL HIGH (ref 10–140)
Total Protein, Urine: 122.1 mg/dL
Volume, Urine: 400 mL

## 2011-09-25 LAB — VANCOMYCIN, RANDOM: Vancomycin Rm: 6 ug/mL

## 2011-09-25 MED ORDER — KIDNEY FAILURE BOOK
Freq: Once | Status: AC
  Administered 2011-09-25: 08:00:00
  Filled 2011-09-25: qty 1

## 2011-09-25 NOTE — Progress Notes (Addendum)
CSW met with patient and patient's husband at bedside to discuss returning to [facility]. Pt and pt's husband are agreeable to returning to [facility]. Pt asked several questions about her dialysis and potential dialysis centers. CSW contacted the nurse case manager to contact the dialysis coordinator to help set the patient up with a dialysis center and schedule. The dialysis coordinator was contacted to meet with the patient.  CSW will continue to follow and assist with d/c planning when the patient is medically stable.

## 2011-09-25 NOTE — Progress Notes (Signed)
VASCULAR AND VEIN SURGERY PROGRESS NOTE  Progress note      HPI: [name] is a 66 y.o. year old female who presents for placement of a permanent hemodialysis access. The patient is right handed. She has a PICC line in her right arm. The patient is currently on hemodialysis vja a temporary catheter in the right IJ. The cause of renal failure is thought to be secondary to multifactorial. Other chronic medical problems include recent hip infection on 6 weeks IV abx via PICC line. She also has hypertension, asthma and prior stroke. All of these are currently stable. She has a VAC dressing on her right hip from recent I and D by Dr Alvan Dame    Significant Diagnostic Studies: CBC    Component Value Date/Time   WBC 11.6* 09/24/2011 0930   RBC 3.30* 09/24/2011 0930   HGB 9.5* 09/24/2011 0930   HCT 28.6* 09/24/2011 0930   PLT 244 09/24/2011 0930   MCV 86.7 09/24/2011 0930   MCH 28.8 09/24/2011 0930   MCHC 33.2 09/24/2011 0930   RDW 17.0* 09/24/2011 0930   LYMPHSABS 2.7 09/19/2011 1830   MONOABS 2.4* 09/19/2011 1830   EOSABS 0.3 09/19/2011 1830   BASOSABS 0.1 09/19/2011 1830    BMET    Component Value Date/Time   NA 138 09/25/2011 0515   K 3.4* 09/25/2011 0515   CL 102 09/25/2011 0515   CO2 28 09/25/2011 0515   GLUCOSE 84 09/25/2011 0515   BUN 10 09/25/2011 0515   CREATININE 2.87* 09/25/2011 0515   CALCIUM 9.6 09/25/2011 0515   CALCIUM 11.1* 09/11/2011 0825   GFRNONAA 16* 09/25/2011 0515   GFRAA 19* 09/25/2011 0515    COAG Lab Results  Component Value Date   INR 1.13 09/15/2011   INR 1.09 09/14/2011   INR 1.61* 09/11/2011   No results found for this basename: PTT     I/O last 3 completed shifts: In: 240 [P.O.:240] Out: 1654 [Urine:350; Drains:180; Other:1124]  Physical Examination  Patient Vitals for the past 24 hrs:  BP Temp Temp src Pulse Resp SpO2 Weight  09/25/11 0952 129/62 mmHg 97.8 F (36.6 C) Oral 76  20  98 % -  09/25/11 0452 137/73 mmHg 98.3 F (36.8 C) Oral 76  20  97 % -    09/24/11 2255 - - - 74  18  95 % -  09/24/11 1948 141/72 mmHg 98.3 F (36.8 C) Oral 92  20  91 % 233 lb 0.4 oz (105.7 kg)  09/24/11 1649 139/73 mmHg 97.9 F (36.6 C) Oral 79  20  100 % -  09/24/11 1426 152/90 mmHg - - 84  - 100 % -  09/24/11 1340 143/60 mmHg - - 80  24  100 % 236 lb 15.9 oz (107.5 kg)  09/24/11 1310 150/74 mmHg - - 76  22  - -  09/24/11 1300 115/52 mmHg - - 77  24  - -  09/24/11 1230 115/62 mmHg - - 79  22  - -  09/24/11 1200 127/55 mmHg - - 73  20  - -   [id number] Pulses: 2+ radial, brachial pulses bilaterally    Assessment/Plan [name] is a 66 y.o. year old female who Will under go placement of fistula in left arm based on vein mapping also with recent hip infection would be less likely to seed new graft. Will need Diatek catheter simultaneously by Dr. Donnetta Hutching on Friday.   [name] [facility] 09/25/2011 11:58 AM

## 2011-09-25 NOTE — Progress Notes (Signed)
Vein mapping pending Plan for catheter and left arm access with Dr Donnetta Hutching Friday 09/28/11  Ruta Hinds

## 2011-09-25 NOTE — Progress Notes (Signed)
Occupational Therapy Treatment Patient Details Name: [name] MRN: [id number] DOB: May 07, 1946 Today's Date: 09/25/2011  OT Assessment/Plan OT Assessment/Plan Comments on Treatment Session: pt. very eager to complete bil UE exercises to increase activity tolerance and overall UE strength. Pt. provided with mod verbal cues for breathing technique during exercises and for encouragement due to overall deconditioning. Educated pt. On completing bil UE exercises at least 3x per day to increase overall UE strength to promote increase independence with ADLs and increase overall endurance. OT Plan: Discharge plan remains appropriate OT Frequency: Min 2X/week Follow Up Recommendations: Skilled nursing facility Equipment Recommended: Defer to next venue OT Goals Acute Rehab OT Goals OT Goal Formulation: With patient Time For Goal Achievement: 2 weeks ADL Goals Pt Will Perform Grooming: with set-up;Sitting at sink;Sitting, edge of bed ADL Goal: Grooming - Progress: Progressing toward goals Additional ADL Goal #1: Pt will be I with bilateral UE strengthening HEP in prep for ADL mobility. ADL Goal: Additional Goal #1 - Progress: Progressing toward goals Additional ADL Goal #2: Pt will be Min A with rolling Lt and Rt using rails in prep for ADLs ADL Goal: Additional Goal #2 - Progress: Progressing toward goals  OT Treatment Precautions/Restrictions  Precautions Precautions: Fall Restrictions Weight Bearing Restrictions: Yes RLE Weight Bearing: Touchdown weight bearing   ADL ADL Grooming: Performed;Brushing hair;Set up Grooming Details (indicate cue type and reason): Pt. encouraged to use left UE to complete to increase shoulder strength Where Assessed - Grooming: Supine, head of bed up   Exercises General Exercises - Upper Extremity Shoulder Flexion: AROM;AAROM;Left;Right;20 reps;Theraband;Other (comment) (Pt. provided with AAROm ~45 degrees to achieve full ROM left) Theraband Level  (Shoulder Flexion): Level 1 (Yellow) Shoulder ABduction: AROM;AAROM;Right;Left;20 reps;Supine;Theraband;Other (comment) (pt. provided with AAROM left at ~30degrees to complete full ) Theraband Level (Shoulder Abduction): Level 1 (Yellow) Elbow Flexion: AROM;Strengthening;Right;Left;20 reps;Supine;Theraband Theraband Level (Elbow Flexion): Level 1 (Yellow) Elbow Extension: AROM;Right;Left;Strengthening;10 reps;Theraband Theraband Level (Elbow Extension): Level 1 (Yellow)  End of Session OT - End of Session Equipment Utilized During Treatment:  (theraband) Activity Tolerance: Patient tolerated treatment well Patient left: in bed;with call bell in reach;with family/visitor present Nurse Communication: Need for lift equipment;Mobility status for transfers General Behavior During Session: [facility] for tasks performed Cognition: [facility] for tasks performed  Lavona Mound, OTR/L Pager [phone]  09/25/2011, 12:44 PM

## 2011-09-25 NOTE — Progress Notes (Signed)
Patient ID: [name], female   DOB: 07-21-1945, 66 y.o.   MRN: [id number]  S: No complaints.  UOP about 300-400 cc/d maximum.    O:BP 123/72  Pulse 85  Temp(Src) 97.4 F (36.3 C) (Oral)  Resp 20  Ht 5\' 5"  (1.651 m)  Wt 105.7 kg (233 lb 0.4 oz)  BMI 38.78 kg/m2  SpO2 99%  Intake/Output Summary (Last 24 hours) at 09/25/11 1656 Last data filed at 09/25/11 1500  Gross per 24 hour  Intake    900 ml  Output    150 ml  Net    750 ml   Weight change: -1.6 kg (-3 lb 8.4 oz) [id number] resting in bed, reading from a book, looks good  [id number] regular in rate and rhythm, heart sounds S1 and S2 normal  Resp:Diminished breath sounds bibasally, no rales/rhonchi  [id number], obese, nontender and bowel sounds are normal  Ext: 1+ lower extremity edema bilaterally      . amLODipine  5 mg Oral Daily  . calcitonin  100 Units Subcutaneous BID  . cefTAZidime (FORTAZ)  IV  2 g Intravenous Q48H  . darbepoetin (ARANESP) injection - NON-DIALYSIS  200 mcg Subcutaneous Q Thu-1800  . diltiazem  240 mg Oral Daily  . kidney failure book   Does not apply Once  . metoprolol tartrate  50 mg Oral BID  . sotalol  80 mg Oral Q48H   No results found. BMET  Lab 09/25/11 0515 09/24/11 0930 09/23/11 0500 09/22/11 0500 09/21/11 0638 09/20/11 0535 09/19/11 0518  NA 138 139 139 140 139 138 138  K 3.4* 3.3* 3.1* 3.2* 3.6 3.5 3.7  CL 102 101 102 103 103 102 102  CO2 28 24 26 28 24 25 27   GLUCOSE 84 89 86 91 96 92 86  BUN 10 27* 22 18 41* 36* 27*  CREATININE 2.87* 5.44* 4.29* 3.31* 5.61* 4.89* 3.84*  ALB -- -- -- -- -- -- --  CALCIUM 9.6 12.3* 11.2* 10.4 13.1* 11.5* 10.6*  PHOS 2.0* 3.5 3.0 2.3 3.4 3.3 2.9   CBC  Lab 09/24/11 0930 09/23/11 0500 09/21/11 0638 09/20/11 0535 09/19/11 1830  WBC 11.6* 12.5* 13.0* 12.6* --  NEUTROABS -- -- -- -- 9.3*  HGB 9.5* 8.9* 8.8* 8.1* --  HCT 28.6* 26.7* 26.3* 24.0* --  MCV 86.7 87.3 86.5 85.1 --  PLT 244 205 217 199 --     Assessment/Plan:  1. Acute on  CRF (CKD baseline creat 2.3)- this occurred shortly after hip surgery. She was d/c'd on only IV fortaz, NO IV VANC at the nursing home, then returned with AKI and high vanc levels.  Renal failure has been reported after placement of antibiotic impregnated spacers involving tobramycin, not sure if vancomycin reported.  We will redraw vanc and tobra levels today, and if they are elevated, explantation of antibiotic spacer will be indicated.  Pt had HD MOnday, holding off until new access placed on Friday.   2. Hypercalcemia-suspected likely from immobilization in this previously active woman with a high body mass index/bone mass. Ruling out the possibility of plasma cell dyscrasia as given marginal free light chain assay as well as unexplained proteinuria/hypoalbuminemia; Vit D, PTH, PTH-RP, all normal; SPEP, UPEP pending. Treated with calcitonin to avoid hemodynamic complications of hypercalcemia- will adjust dose of the calcitonin given the recommended dose is 4 units per kilogram. Currently no indications for bisphosphonates which would be best avoided if nephrotoxic ATN is suspected. Calcium better.  3. 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 Alvan Dame with sterile seroma; wound VAC in place 4. HTN  5. Anemia- ABLA and acute illness with CKD at baseline, transfuse as needed; Aranesp started; iron OK 6. Gout on allopurinol, reduced dose is appropriate  7. Resolved PNA 8. Volume- improved    Kelly Splinter  MD [facility] [phone] pgr    [phone] cell 09/25/2011, 4:56 PM

## 2011-09-26 DIAGNOSIS — N19 Unspecified kidney failure: Secondary | ICD-10-CM

## 2011-09-26 LAB — RENAL FUNCTION PANEL
Albumin: 2.3 g/dL — ABNORMAL LOW (ref 3.5–5.2)
BUN: 13 mg/dL (ref 6–23)
CO2: 25 mEq/L (ref 19–32)
Calcium: 10.7 mg/dL — ABNORMAL HIGH (ref 8.4–10.5)
Chloride: 103 mEq/L (ref 96–112)
Creatinine, Ser: 3.9 mg/dL — ABNORMAL HIGH (ref 0.50–1.10)
GFR calc Af Amer: 13 mL/min — ABNORMAL LOW (ref >90)
GFR calc non Af Amer: 11 mL/min — ABNORMAL LOW (ref >90)
Glucose, Bld: 85 mg/dL (ref 70–99)
Phosphorus: 2.7 mg/dL (ref 2.3–4.6)
Potassium: 3.4 mEq/L — ABNORMAL LOW (ref 3.5–5.1)
Sodium: 139 mEq/L (ref 135–145)

## 2011-09-26 LAB — TOBRAMYCIN LEVEL, RANDOM: Tobramycin Rm: 1.6 ug/mL

## 2011-09-26 MED ORDER — POTASSIUM CHLORIDE CRYS ER 20 MEQ PO TBCR
30.0000 meq | EXTENDED_RELEASE_TABLET | Freq: Once | ORAL | Status: AC
  Administered 2011-09-26: 30 meq via ORAL
  Filled 2011-09-26: qty 1

## 2011-09-26 MED ORDER — HYDROCODONE-ACETAMINOPHEN 5-325 MG PO TABS
1.0000 | ORAL_TABLET | ORAL | Status: DC | PRN
  Administered 2011-09-28 – 2011-10-02 (×10): 2 via ORAL
  Filled 2011-09-26 (×10): qty 2

## 2011-09-26 NOTE — Progress Notes (Signed)
CSW spoke with patient at bediside and informed her that [facility](SNF) may not be able to meet her complex needs. Patient was OK with this and is amenable to choosing another facility that can manage all of her needs. CSW updated patient's FL2 and re-faxed pt's information out. CSW provided patient with a new choice list of SNF facilities. CSW will continue to update the facilities as pt gets closer to d/c. Her disposition will depend on her HD and dialysis center as well as if she is d/c'd with her wound vac. CSW will continue to address patient's needs and help facilitate d/c to a SNF.

## 2011-09-26 NOTE — Progress Notes (Signed)
Physical Therapy Treatment Patient Details Name: [name] MRN: [id number] DOB: [birth year] Today's Date: 09/26/2011  PT Assessment/Plan  PT - Assessment/Plan Comments on Treatment Session: pt rpesents s/p hardware removed from L hip with antibiotic spacer placed.  pt with difficulty with TDWB on R LE secondary to weakness and habitus.  pt still needed +2 for lateral scoot to drop arm recliner, however was able to maintain TDWBing.   PT Plan: Discharge plan remains appropriate PT Frequency: Min 2X/week Follow Up Recommendations: Skilled nursing facility Equipment Recommended: Defer to next venue PT Goals  Acute Rehab PT Goals PT Goal: Supine/Side to Sit - Progress: Progressing toward goal Pt will go Sit to Stand: with +2 total assist;with upper extremity assist (with TDWB pt 60%) PT Goal: Sit to Stand - Progress: Goal set today Pt will go Stand to Sit: with +2 total assist;with upper extremity assist (pt 60%) PT Goal: Stand to Sit - Progress: Progressing toward goal Pt will Transfer Bed to Chair/Chair to Bed: with mod assist (lateral scoot) PT Transfer Goal: Bed to Chair/Chair to Bed - Progress: Goal set today  PT Treatment Precautions/Restrictions  Precautions Precautions: Fall Precaution Comments: o Restrictions Weight Bearing Restrictions: Yes RLE Weight Bearing: Touchdown weight bearing Other Position/Activity Restrictions: o Mobility (including Balance) Bed Mobility Bed Mobility: Yes Supine to Sit Details (indicate cue type and reason): Pt. presented in long sitting in bed Sitting - Scoot to Edge of Bed: 1: +2 Total assist;Patient percentage (comment) (pt=70% ) Sitting - Scoot to Edge of Bed Details (indicate cue type and reason): Provided assist with use of pad under pt.  Transfers Transfers: Yes Sit to Stand: 1: +2 Total assist;From elevated surface;From bed;With upper extremity assist Sit to Stand Details (indicate cue type and reason): pt=50% with bil UE support on  bed and mod verbal cues for weightbearing TWb Left LE Lateral/Scoot Transfers: 1: +2 Total assist;Patient percentage (comment);With armrests removed (pt 50%) Lateral/Scoot Transfer Details (indicate cue type and reason): cues for use of UEs and safe technique  for lateral scoot to drop arm recliner.   Ambulation/Gait Ambulation/Gait: No Stairs: No Wheelchair Mobility Wheelchair Mobility: No    Exercise    End of Session PT - End of Session Equipment Utilized During Treatment: Gait belt Activity Tolerance: Patient limited by fatigue;Patient limited by pain Patient left: in chair;with call bell in reach;with family/visitor present Nurse Communication: Mobility status for transfers General Behavior During Session: [facility] for tasks performed Cognition: [facility] for tasks performed  Catarina Hartshorn, [place] 09/26/2011, 1:32 PM

## 2011-09-26 NOTE — Progress Notes (Signed)
Occupational Therapy Treatment Patient Details Name: MELISAA VANDENAKKER MRN: [id number] DOB: 07-08-1946 Today's Date: 09/26/2011  OT Assessment/Plan OT Assessment/Plan Comments on Treatment Session: Pt. motivated today to get OOB to chair and due to pt with increased difficulty maintaining TWB for Left LE, completed lateral scooting with drop-arm recliner to complete bed-chair transfer. pt. with improved activity tolerance. OT Plan: Discharge plan remains appropriate OT Frequency: Min 2X/week Follow Up Recommendations: Skilled nursing facility Equipment Recommended: Defer to next venue OT Goals Acute Rehab OT Goals OT Goal Formulation: With patient Time For Goal Achievement: 2 weeks ADL Goals Pt Will Perform Grooming: with set-up;Sitting at sink;Sitting, edge of bed ADL Goal: Grooming - Progress: Met Additional ADL Goal #1: Pt will be I with bilateral UE strengthening HEP in prep for ADL mobility. ADL Goal: Additional Goal #1 - Progress: Progressing toward goals Additional ADL Goal #2: Pt will be Min A with rolling Lt and Rt using rails in prep for ADLs ADL Goal: Additional Goal #2 - Progress: Progressing toward goals  OT Treatment Precautions/Restrictions  Precautions Precautions: Fall Restrictions Weight Bearing Restrictions: Yes RLE Weight Bearing: Touchdown weight bearing   ADL ADL Grooming: Performed;Brushing hair;Set up Grooming Details (indicate cue type and reason): Supervision for sitting balance EOB Where Assessed - Grooming: Sitting, bed Toileting - Hygiene: Simulated;+2 Total assistance;Comment for patient % (pt=50%) Ambulation Related to ADLs: NA ADL Comments: Pt. TWB rt. LE due to no joint (only antibiotic spacers currently) and pt. unable to maintain weightbearing precaution with sit-stand. pt. completed bed-chair with side scooting with facilitation provided with use of pad. Pt. provided with mod verbal cues throughout for transfer technique for lateral scooting  pushing through left le and hand placement on bed with no weightbearing through Rt. LE to rt.side. Mobility  Bed Mobility Bed Mobility: Yes Supine to Sit Details (indicate cue type and reason): Pt. presented in long sitting in bed Sitting - Scoot to Edge of Bed: 1: +2 Total assist;Patient percentage (comment) (pt=70% ) Sitting - Scoot to Edge of Bed Details (indicate cue type and reason): Provided assist with use of pad under pt.  Transfers Transfers: Yes Sit to Stand: 1: +2 Total assist;From elevated surface;From bed;With upper extremity assist Sit to Stand Details (indicate cue type and reason): pt=50% with bil UE support on bed and mod verbal cues for weightbearing TWb Left LE  End of Session OT - End of Session Equipment Utilized During Treatment: Gait belt Activity Tolerance: Patient tolerated treatment well Patient left: with call bell in reach;with family/visitor present;in chair Nurse Communication: Need for lift equipment;Mobility status for transfers General Behavior During Session: [facility] for tasks performed Cognition: [facility] for tasks performed  Lavona Mound, OTR/L Pager [phone]  09/26/2011, 11:58 AM

## 2011-09-26 NOTE — Progress Notes (Signed)
Pt placed on cpap 8 cm H2O with nasal mask at this time. Pt tolerating well.  Kathie Dike RRT

## 2011-09-26 NOTE — Progress Notes (Addendum)
Right  Upper Extremity Vein Map    Cephalic  Segment Diameter Depth Comment  1. Axilla mm mm Too small to see  2. Mid upper arm 0.79  mm mm   3. Above AC 1.54  mm mm   4. In AC 1.54  mm mm   5. Below AC 1.75  mm mm   6. Mid forearm 2.54  mm mm   7. Wrist 1.25  mm mm    mm mm    mm mm    mm mm    Basilic  Not imaged  PIC line  Segment Diameter Depth Comment  1. Axilla mm mm   2. Mid upper arm mm mm   3. Above AC mm mm   4. In AC mm mm   5. Below AC mm mm   6. Mid forearm mm mm   7. Wrist mm mm    mm mm    mm mm    mm mm            Left Upper Extremity Vein Map    Cephalic  Segment Diameter Depth Comment  1. Axilla 2.72  mm mm   2. Mid upper arm 2.72  mm mm   3. Above AC 3.64  mm mm   4. In AC 6.93 mm mm   5. Below AC 3.41  mm mm Bifid for approx. 1 inch  6. Mid forearm 3.4  mm mm   7. Wrist 2.74 mm mm    mm mm    mm mm    mm mm    Basilic                   Segment Diameter Depth Comment  1. Axilla mm mm   2. Mid upper arm mm mm Not found  3. Above AC mm mm Not found  4. In AC mm mm Not found  5. Below AC mm mm Not found  6. Mid forearm mm mm Not found  7. Wrist mm mm Not found   mm mm    mm mm    mm mm   09/26/2011 Margarette Canada  RVT 5:55 PM

## 2011-09-26 NOTE — Progress Notes (Signed)
Patient ID: [name], female   DOB: 1946-01-13, 66 y.o.   MRN: [id number]  S: No complaints.     O:BP 134/73  Pulse 78  Temp(Src) 97.9 F (36.6 C) (Oral)  Resp 20  Ht 5\' 5"  (1.651 m)  Wt 106.5 kg (234 lb 12.6 oz)  BMI 39.07 kg/m2  SpO2 98%  Intake/Output Summary (Last 24 hours) at 09/26/11 0954 Last data filed at 09/26/11 0857  Gross per 24 hour  Intake    840 ml  Output    150 ml  Net    690 ml   Weight change: -0.7 kg (-1 lb 8.7 oz) Gen: sitting up with no assist at side of bed, no distress [id number] regular in rate and rhythm, heart sounds S1 and S2 normal  Resp:Diminished breath sounds bibasally, no rales/rhonchi  [id number], obese, nontender and bowel sounds are normal  Ext: 1+ lower extremity edema bilaterally      . amLODipine  5 mg Oral Daily  . cefTAZidime (FORTAZ)  IV  2 g Intravenous Q48H  . darbepoetin (ARANESP) injection - NON-DIALYSIS  200 mcg Subcutaneous Q Thu-1800  . diltiazem  240 mg Oral Daily  . metoprolol tartrate  50 mg Oral BID  . sotalol  80 mg Oral Q48H   No results found. BMET  Lab 09/26/11 0600 09/25/11 0515 09/24/11 0930 09/23/11 0500 09/22/11 0500 09/21/11 0638 09/20/11 0535  NA 139 138 139 139 140 139 138  K 3.4* 3.4* 3.3* 3.1* 3.2* 3.6 3.5  CL 103 102 101 102 103 103 102  CO2 25 28 24 26 28 24 25   GLUCOSE 85 84 89 86 91 96 92  BUN 13 10 27* 22 18 41* 36*  CREATININE 3.90* 2.87* 5.44* 4.29* 3.31* 5.61* 4.89*  ALB -- -- -- -- -- -- --  CALCIUM 10.7* 9.6 12.3* 11.2* 10.4 13.1* 11.5*  PHOS 2.7 2.0* 3.5 3.0 2.3 3.4 3.3   CBC  Lab 09/24/11 0930 09/23/11 0500 09/21/11 0638 09/20/11 0535 09/19/11 1830  WBC 11.6* 12.5* 13.0* 12.6* --  NEUTROABS -- -- -- -- 9.3*  HGB 9.5* 8.9* 8.8* 8.1* --  HCT 28.6* 26.7* 26.3* 24.0* --  MCV 86.7 87.3 86.5 85.1 --  PLT 244 205 217 199 --     Assessment/Plan:  1. Acute on CRF (CKD baseline creat 2.3)- this occurred shortly after hip surgery on 2/18.  Her highest vanc levels were mid to high  30's, and looking back she had rec'd IV vanc on 2/18 and again on 2/26, in addition she rec'd an unclear amount mixed in with cement and beads placed into the hip (several gms).  Vanc level repeated yesterday was down to 6, which is low.  Tobra level is pending. Doubt she will have elevated levels this far out from surgery.  Plan is for vein mapping today, and TDC/perm access placement on Friday.  CLIP in process for outpt HD in conjunction with SNF placement.   2. Hypercalcemia- suspected likely from immobilization. Myeloma workup negative.  S/P calcitonin. Ca+ increasing, will dialyze on 2.0 Ca++ bath.  3. 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 Alvan Dame with sterile seroma; wound VAC in place 4. HTN  5. Anemia- ABLA and acute illness with CKD at baseline, transfuse as needed; Aranesp started; iron OK 6. Gout on allopurinol, reduced dose is appropriate  7. Resolved PNA 8. Volume- improved    Kelly Splinter  MD [facility] [phone] pgr    [phone] cell 09/26/2011, 9:54 AM

## 2011-09-26 NOTE — Progress Notes (Signed)
ANTIBIOTIC CONSULT NOTE - FOLLOW UP  Pharmacy Consult for Ceftazadime  Indication: pseudomonas prosthetic joint infection and pneumonia  Assessment: 19 YOF s/p recent removal of hip prosthesis and placement of antibiotic spacer. Cultures grew pan-sensitive P. Aeruginosa. Discharged on 2/21 and re-presented 2/25 with confusion from NH. Vanco/levofloxacin stopped following 6 days of tx. Continues on fortaz 1gm IV q48h (Day #14/42). Pt is afebrile tmax 98.4, WBC improving (11.6<<12.5<<13.0. Will continue to monitor and f/u HD plans with renal. Will continue current regimen. Pt to have vein mapping done today per renal and perm cath placement on friday  Plan:  1. Continue Fortaz 2 gm IV q48hrs, per ID plan to continue for total of 6 weeks - stop date 10/16/11.  2. Cont to monitor renal fx, HD plan, WBC, and tx plans   Gerrit Halls, PharmD [phone] 09/26/2011,12:25 PM   Allergies  Allergen Reactions  . Coumadin Other (See Comments)    Caused her to have a stroke    Patient Measurements: Height: 5\' 5"  (165.1 cm) Weight: 234 lb 12.6 oz (106.5 kg) IBW/kg (Calculated) : 57   Vital Signs:   Intake/Output from previous day: 03/12 0701 - 03/13 0700 In: 960 [P.O.:960] Out: 150 [Drains:150] Intake/Output from this shift: Total I/O In: 240 [P.O.:240] Out: 0   Labs:  Basename 09/26/11 0600 09/25/11 0515 09/24/11 0930  WBC -- -- 11.6*  HGB -- -- 9.5*  PLT -- -- 244  LABCREA -- -- --  CREATININE 3.90* 2.87* 5.44*   Estimated Creatinine Clearance: 17.9 ml/min (by C-G formula based on Cr of 3.9).  Basename 09/25/11 1800  VANCOTROUGH --  VANCOPEAK --  VANCORANDOM 6.0  GENTTROUGH --  GENTPEAK --  GENTRANDOM --  TOBRATROUGH --  TOBRAPEAK --  TOBRARND --  AMIKACINPEAK --  AMIKACINTROU --  AMIKACIN --     Microbiology: Recent Results (from the past 720 hour(s))  SURGICAL PCR SCREEN     Status: Abnormal   Collection Time   08/28/11 10:33 AM      Component Value Range  Status Comment   MRSA, PCR NEGATIVE  NEGATIVE  Final    Staphylococcus aureus POSITIVE (*) NEGATIVE  Final   GRAM STAIN     Status: Normal   Collection Time   09/03/11  8:08 AM      Component Value Range Status Comment   Specimen Description FLUID SYNOVIAL RIGHT HIP ON SWAB   Final    Special Requests NONE   Final    Gram Stain     Final    Value: ABUNDANT WBC PRESENT, PREDOMINANTLY PMN     NO ORGANISMS SEEN     Gram Stain Report Called to,Read Back By and Verified With:     DAVENPORT,S. RN AT [id number] ON 09/03/11 BY GILLESPIE,B.   Report Status 09/03/2011 FINAL   Final   BODY FLUID CULTURE     Status: Normal   Collection Time   09/03/11  8:09 AM      Component Value Range Status Comment   Specimen Description FLUID SYNOVIAL RIGHT HIP ON SWAB   Final    Special Requests NONE   Final    Gram Stain     Final    Value: ABUNDANT WBC PRESENT, PREDOMINANTLY PMN     NO ORGANISMS SEEN     Gram Stain Report Called to,Read Back By and Verified With: Gram Stain Report Called to,Read Back By and Verified With: Carolanne Grumbling RN @ [phone] ON 09/03/11 BY GILLESPIE B Performed by  [facility]   Culture     Final    Value: RARE PSEUDOMONAS AERUGINOSA     Note: CRITICAL RESULT CALLED TO, READ BACK BY AND VERIFIED WITH: KIMBERLY OWENS   Report Status 09/06/2011 FINAL   Final    Organism ID, Bacteria PSEUDOMONAS AERUGINOSA   Final   ANAEROBIC CULTURE     Status: Normal   Collection Time   09/03/11  8:10 AM      Component Value Range Status Comment   Specimen Description FLUID SYNOVIAL RIGHT HIP   Final    Special Requests NONE   Final    Gram Stain     Final    Value: NO WBC SEEN     NO SQUAMOUS EPITHELIAL CELLS SEEN     NO ORGANISMS SEEN   Culture NO ANAEROBES ISOLATED   Final    Report Status 09/07/2011 FINAL   Final   CULTURE, BLOOD (ROUTINE X 2)     Status: Normal   Collection Time   09/10/11  7:55 PM      Component Value Range Status Comment   Specimen Description BLOOD   Final    Special  Requests BOTTLES DRAWN AEROBIC AND ANAEROBIC 3CC   Final    Culture  Setup Time [id number]   Final    Culture NO GROWTH 5 DAYS   Final    Report Status 09/17/2011 FINAL   Final   CULTURE, BLOOD (ROUTINE X 2)     Status: Normal   Collection Time   09/10/11  8:00 PM      Component Value Range Status Comment   Specimen Description BLOOD LEFT ARM   Final    Special Requests BOTTLES DRAWN AEROBIC AND ANAEROBIC 4CC   Final    Culture  Setup Time [id number]   Final    Culture NO GROWTH 5 DAYS   Final    Report Status 09/17/2011 FINAL   Final   URINE CULTURE     Status: Normal   Collection Time   09/10/11 10:53 PM      Component Value Range Status Comment   Specimen Description URINE, CATHETERIZED   Final    Special Requests NONE   Final    Culture  Setup Time [id number]   Final    Colony Count NO GROWTH   Final    Culture NO GROWTH   Final    Report Status 09/12/2011 FINAL   Final   MRSA PCR SCREENING     Status: Normal   Collection Time   09/11/11 12:48 AM      Component Value Range Status Comment   MRSA by PCR NEGATIVE  NEGATIVE  Final   CULTURE, RESPIRATORY     Status: Normal   Collection Time   09/12/11  6:53 PM      Component Value Range Status Comment   Specimen Description TRACHEAL ASPIRATE   Final    Special Requests NONE   Final    Gram Stain     Final    Value: NO WBC SEEN     MODERATE SQUAMOUS EPITHELIAL CELLS PRESENT     RARE GRAM POSITIVE COCCI IN PAIRS   Culture Non-Pathogenic Oropharyngeal-type Flora Isolated.   Final    Report Status 09/14/2011 FINAL   Final     Anti-infectives     Start     Dose/Rate Route Frequency Ordered Stop   09/24/11 1200   cefTAZidime (FORTAZ) 2 g in dextrose 5 % 50 mL  IVPB        2 g 100 mL/hr over 30 Minutes Intravenous Every 48 hours 09/22/11 1235     09/22/11 1200   cefTAZidime (FORTAZ) 2 g in dextrose 5 % 50 mL IVPB  Status:  Discontinued        2 g 100 mL/hr over 30 Minutes Intravenous Every 24 hours 09/22/11 0750 09/22/11  1235   09/21/11 1200   cefTAZidime (FORTAZ) 2 g in dextrose 5 % 50 mL IVPB  Status:  Discontinued        2 g 100 mL/hr over 30 Minutes Intravenous Every 48 hours 09/19/11 1347 09/22/11 0750   09/20/11 1200   cefTAZidime (FORTAZ) 2 g in dextrose 5 % 50 mL IVPB  Status:  Discontinued        2 g 100 mL/hr over 30 Minutes Intravenous Every 24 hours 09/19/11 1329 09/19/11 1346   09/15/11 1110   tobramycin (NEBCIN) powder  Status:  Discontinued          As needed 09/15/11 1117 09/15/11 1135   09/15/11 1110   vancomycin (VANCOCIN) powder  Status:  Discontinued          As needed 09/15/11 1118 09/15/11 1135   09/15/11 1015   vancomycin (VANCOCIN) powder 2,000 mg  Status:  Discontinued        2,000 mg Other To Surgery 09/15/11 1009 09/16/11 1018   09/15/11 1015   tobramycin (NEBCIN) powder 1.2 g  Status:  Discontinued        1.2 g Topical To Surgery 09/15/11 1010 09/16/11 1018   09/15/11 1015   tobramycin (NEBCIN) powder 1.2 g  Status:  Discontinued        1.2 g Topical To Surgery 09/15/11 1010 09/16/11 1018   09/13/11 1100   cefTAZidime (FORTAZ) 2 g in dextrose 5 % 50 mL IVPB  Status:  Discontinued        2 g 100 mL/hr over 30 Minutes Intravenous Every 48 hours 09/13/11 1016 09/19/11 1329   09/12/11 2200   levofloxacin (LEVAQUIN) IVPB 500 mg  Status:  Discontinued        500 mg 100 mL/hr over 60 Minutes Intravenous Every 48 hours 09/11/11 0042 09/17/11 1250   09/12/11 0200   ceFEPIme (MAXIPIME) 1 g in dextrose 5 % 50 mL IVPB  Status:  Discontinued        1 g 100 mL/hr over 30 Minutes Intravenous Every 24 hours 09/11/11 1003 09/13/11 0851   09/11/11 0045   cefTRIAXone (ROCEPHIN) 1 g in dextrose 5 % 50 mL IVPB  Status:  Discontinued        1 g 100 mL/hr over 30 Minutes Intravenous Daily at bedtime 09/11/11 0017 09/11/11 1003   09/10/11 2300   vancomycin (VANCOCIN) 2,000 mg in sodium chloride 0.9 % 500 mL IVPB        2,000 mg 250 mL/hr over 120 Minutes Intravenous  Once 09/10/11 2200  09/11/11 0513   09/10/11 2145   ceFEPIme (MAXIPIME) 1 g in dextrose 5 % 50 mL IVPB        1 g 100 mL/hr over 30 Minutes Intravenous  Once 09/10/11 2138 09/11/11 0241   09/10/11 2145   Levofloxacin (LEVAQUIN) IVPB 750 mg        750 mg 100 mL/hr over 90 Minutes Intravenous  Once 09/10/11 2138 09/10/11 2323

## 2011-09-26 NOTE — Progress Notes (Addendum)
PROGRESS NOTE  NKAUJ MASKER [id number] DOB: 03-28-1946 DOA: 09/10/2011 PCP: Garnet Koyanagi, DO, DO  Brief narrative: 66 yo female admit 2/25 to [facility] with confusion/AMS and found to be in AKI with creat 5.1 (after 2/13 admit for R hip infection breakdown)-she said she was "talking out of her head". She had no chronic chills fever nausea or vomiting. She did complain of some diarrhea and has reported decreased urine output. On admission her creatinine was 5.16 and at baseline is 2.3. After the first dialysis 2/26 patient became obtunded had myoclonic seizures and had altered mental status and was noted that she was having very shallow inspiration expiration and on the critical care was consulted. She ultimately was intubated and kept on critical care service until 09/18/11. She currently is being worked up for the cause of her kidney failure. Most of the workup so far has been negative although the thought is that she has possible drug induced nephritis--she had antibiotic beads placed a spacer is in her hips and these were combined with tobramycin as well as vancomycin which could be a possible cause a 24-hour UPEP was done and I discussed the findings of this with oncologist Dr. Marin Olp who thinks it is highly unlikely that she has myeloma. Her being on steroids precludes renal biopsy and preparations were being made to place a tunneled catheter and possibly look for dialysis access-she currently is being set up at the dialysis center and likely will have a permanent fistula placed this Friday 3/15 and likely should be able to be discharged subsequent to that.    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  2/28 Ortho called  3/1 remains awake alert, poor output  3/2 to OR for I&D R hip, placement abx beads, removal non purulent seroma  2/28 2D echo>>> EF 50-55%, mild MR, cannot exclude vegetation MV, mod TR, PA press 39mmHg, No obvious vegetation 3/9 Skeletal survery=no lytic/suspicious findings for Myeloma   Antibiotics: 2/25 Maxipime>>>off  2/26 Rocephin>> 2/26  2/25 Levaquin>>> 3/4  2/28 vanc>>>off  2/28 Ceftaz>>>    Subjective  She has no complaints today but admits to eating poorly.    Objective    Objective: Filed Vitals:   09/25/11 2011 09/25/11 2359 09/26/11 1818 09/26/11 2112  BP: 134/73  142/79 152/80  Pulse: 75 78 75 67  Temp: 97.9 F (36.6 C)  97.8 F (36.6 C) 98.5 F (36.9 C)  TempSrc: Oral  Oral Oral  Resp: 20 20 20 18   Height:      Weight: 106.5 kg (234 lb 12.6 oz)     SpO2: 98% 98% 98% 94%    Intake/Output Summary (Last 24 hours) at 09/26/11 2157 Last data filed at 09/26/11 2113  Gross per 24 hour  Intake    480 ml  Output   1050 ml  Net   -570 ml    Exam:  General: Pleasant African American female, frail appearing but smiling.  Right internal jugular access present Cardiovascular: S1-S2 no murmur rub or gallop. Slightly irregular rhythm Respiratory: Clinically clear Abdomen: Soft nontender nondistended, obese Skin intact with a drain on the right thigh and some swelling  right thigh right leg-Wound vac in Place RLE Neuro grossly intact  Data Reviewed: Basic Metabolic Panel:  Lab [id number] 0600 09/25/11 0515 09/24/11 0930 09/23/11 0500 09/22/11 0500  NA 139 138 139 139 140  K 3.4* 3.4* -- -- --  CL 103 102 101 102 103  CO2 25 28 24 26 28   GLUCOSE 85 84 89 86 91  BUN 13 10 27* 22 18  CREATININE 3.90* 2.87* 5.44* 4.29* 3.31*  CALCIUM 10.7* 9.6 12.3* 11.2* 10.4  MG -- -- -- -- --  PHOS 2.7 2.0* 3.5  3.0 2.3   Liver Function Tests:  Lab 09/26/11 0600 09/25/11 0515 09/24/11 0930 09/23/11 0500 09/22/11 0500  AST -- -- -- -- --  ALT -- -- -- -- --  ALKPHOS -- -- -- -- --  BILITOT -- -- -- -- --  PROT -- -- -- -- --  ALBUMIN 2.3* 2.2* 2.3* 2.2* 2.1*   No results found for this basename: LIPASE:5,AMYLASE:5 in the last 168 hours No results found for this basename: AMMONIA:5 in the last 168 hours CBC:  Lab 09/24/11 0930 09/23/11 0500 09/21/11 0638 09/20/11 0535  WBC 11.6* 12.5* 13.0* 12.6*  NEUTROABS -- -- -- --  HGB 9.5* 8.9* 8.8* 8.1*  HCT 28.6* 26.7* 26.3* 24.0*  MCV 86.7 87.3 86.5 85.1  PLT 244 205 217 199   Cardiac Enzymes: No results found for this basename: CKTOTAL:5,CKMB:5,CKMBINDEX:5,TROPONINI:5 in the last 168 hours BNP: No components found with this basename: POCBNP:5 CBG:  Lab 09/21/11 0709 09/20/11 1638 09/20/11 1307 09/20/11 0801 09/20/11 0537  GLUCAP 99 123* 123* 96 97    No results found for this or any previous visit (from the past 240 hour(s)).   Studies:    Scheduled Meds:    . amLODipine  5 mg Oral Daily  . cefTAZidime (FORTAZ)  IV  2 g Intravenous Q48H  . darbepoetin (ARANESP) injection - NON-DIALYSIS  200 mcg Subcutaneous Q Thu-1800  . diltiazem  240 mg Oral Daily  . metoprolol tartrate  50 mg Oral BID  . potassium chloride  30 mEq Oral Once  . sotalol  80 mg Oral Q48H   Continuous Infusions:    . sodium chloride 10 mL/hr at 09/26/11 0959     Assessment/Plan: 1. Acute on chronic respiratory failure, history of asthma-was on prednisone until 09/17/11.  No current issues and stable 2. Encephalopathy-resolved, was secondary to possible renal insufficiency.issues. Currently mentating well and seems to be a baseline 3. 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 and antibiotic beads placed and 3/12-had washout 3/2 with non-purulence and seroma-ID has consulted and have recommended 6  weeks total of antibiotics finishing 10/16/2011-will need to continue ceftaz time every 48 hourly with renal dosing.  Appreciate pharmacy assistance. Orthopedics to detemine if Stimulan beads placed 3/2 need to be removed, once Vancomycin and tobramycin levels return 4. Acute renal insufficiency on CKD. stage III-output 300-400. Cause remains undefined-not inclined to suspect Light chain disease-More likely Vancomycin/Tobramycin toxicity. Ddx includes ATN, AIN, Vancomycin toxicity-her ANA was 1:40-DS-DNA was 14 and therefore negative. 5. Hypercalcemia-calcium 9.6, but prior to dilaysis yesterday was 12.3.  Oncology recommended doing a 24-hour urine protein electrophoresis  which was essentially negative. Skeletal survey done negative for Myleoma.  Continue Calcitonin 100 Units bid im. Cannot use a bisphosphonate given that she has renal insufficiency  6. ABLA/Iron deficiency-her baseline seems to be between 8 and 9.  transfused this admission.  We will transfuse if her hemoglobin drops below 7 repeat labs in the morning.  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 Leukocytosis-likely secondary to margination from steroids.  As no fever would not workup any further present-could be related to Heme anomaly that might be causing her Hypercalcemia. 8. Atrial fibrillation-alternating with normal sinus with 1st degree AVB at present rates in the low 100s. Diltiazem 240, Toprol 50 mg twice a day,  sotalol 80 mg every 48 hourly-not on coumadin. 9. Obstructive sleep apnea-continue CPAP 10. Hypertension-blood pressures moderately controlled.  Norvasc 5 mg added  Code Status: Full Disposition Plan: Disposition depends on whether she needs dialysis or not. Will defer to judgment with nephrology regarding when to discharge as she may need intermittent dialysis-after permanent catheter for dialysis .  Being set up with Dialysis and for permanent dialysis Family understands the course of care and full discussion undertaken with  Nephrology-appreciate   Debbe Odea, MD  [facility] Pager [phone]   09/26/2011, 9:57 PM    LOS: 16 days

## 2011-09-27 ENCOUNTER — Encounter (HOSPITAL_COMMUNITY): Payer: Self-pay | Admitting: Anesthesiology

## 2011-09-27 MED ORDER — HEPARIN SODIUM (PORCINE) 1000 UNIT/ML DIALYSIS
20.0000 [IU]/kg | INTRAMUSCULAR | Status: DC | PRN
  Filled 2011-09-27: qty 3

## 2011-09-27 MED ORDER — BOOST / RESOURCE BREEZE PO LIQD
1.0000 | Freq: Two times a day (BID) | ORAL | Status: DC
  Administered 2011-09-27 – 2011-10-02 (×9): 1 via ORAL

## 2011-09-27 NOTE — Progress Notes (Signed)
  VASCULAR AND VEIN SURGERY PROGRESS NOTE  Progress note  Date of Surgery: 09/10/2011 - 09/15/2011 Surgeon: Juliann Mule): [name], MD AV fistula in left arm and diatek catheter placement planned for Friday by Dr. Donnetta Hutching.    HPI: [name] is a 66 y.o. female who presents for placement of a permanent hemodialysis access. The patient is right handed. She has a PICC line in her right arm. The patient is currently on hemodialysis vja a temporary catheter in the right IJ. The cause of renal failure is thought to be secondary to multifactorial. Other chronic medical problems include recent hip infection on 6 weeks IV abx via PICC line. She also has hypertension, asthma and prior stroke. All of these are currently stable. She has a VAC dressing on her right hip from recent I and D by Dr Alvan Dame       Significant Diagnostic Studies: CBC    Component Value Date/Time   WBC 11.6* 09/24/2011 0930   RBC 3.30* 09/24/2011 0930   HGB 9.5* 09/24/2011 0930   HCT 28.6* 09/24/2011 0930   PLT 244 09/24/2011 0930   MCV 86.7 09/24/2011 0930   MCH 28.8 09/24/2011 0930   MCHC 33.2 09/24/2011 0930   RDW 17.0* 09/24/2011 0930   LYMPHSABS 2.7 09/19/2011 1830   MONOABS 2.4* 09/19/2011 1830   EOSABS 0.3 09/19/2011 1830   BASOSABS 0.1 09/19/2011 1830    BMET    Component Value Date/Time   NA 139 09/26/2011 0600   K 3.4* 09/26/2011 0600   CL 103 09/26/2011 0600   CO2 25 09/26/2011 0600   GLUCOSE 85 09/26/2011 0600   BUN 13 09/26/2011 0600   CREATININE 3.90* 09/26/2011 0600   CALCIUM 10.7* 09/26/2011 0600   CALCIUM 11.1* 09/11/2011 0825   GFRNONAA 11* 09/26/2011 0600   GFRAA 13* 09/26/2011 0600    COAG Lab Results  Component Value Date   INR 1.13 09/15/2011   INR 1.09 09/14/2011   INR 1.61* 09/11/2011   No results found for this basename: PTT     I/O last 3 completed shifts: In: 480 [P.O.:480] Out: 1300 [Urine:450; Drains:850]  Physical Examination  Patient Vitals for the past 24 hrs:  BP Temp Temp src Pulse  Resp SpO2  09/27/11 0457 134/62 mmHg 97.7 F (36.5 C) Oral 72  18  100 %  09/26/11 2351 - - - 68  18  -  09/26/11 2112 152/80 mmHg 98.5 F (36.9 C) Oral 67  18  94 %  09/26/11 1818 142/79 mmHg 97.8 F (36.6 C) Oral 75  20  98 %      Assessment/Plan  [name] is a 66 y.o. year old female who is going to under go AV fistula on the left upper ext. And diatek cath placement.  [name] [facility] 09/27/2011 7:29 AM

## 2011-09-27 NOTE — Progress Notes (Signed)
Patient ID: [name], female   DOB: Apr 08, 1946, 66 y.o.   MRN: [id number]  S: No complaints.     O:BP 143/70  Pulse 20  Temp(Src) 97.4 F (36.3 C) (Oral)  Resp 18  Ht 5\' 5"  (1.651 m)  Wt 106.5 kg (234 lb 12.6 oz)  BMI 39.07 kg/m2  SpO2 100%  Intake/Output Summary (Last 24 hours) at 09/27/11 1341 Last data filed at 09/27/11 1300  Gross per 24 hour  Intake    600 ml  Output   1500 ml  Net   -900 ml   Weight change:  Gen: sitting up with no assist at side of bed, no distress [id number] regular in rate and rhythm, heart sounds S1 and S2 normal  Resp:Diminished breath sounds bibasally, no rales/rhonchi  [id number], obese, nontender and bowel sounds are normal  Ext: 1+ lower extremity edema bilaterally      . amLODipine  5 mg Oral Daily  . cefTAZidime (FORTAZ)  IV  2 g Intravenous Q48H  . darbepoetin (ARANESP) injection - NON-DIALYSIS  200 mcg Subcutaneous Q Thu-1800  . diltiazem  240 mg Oral Daily  . metoprolol tartrate  50 mg Oral BID  . sotalol  80 mg Oral Q48H   No results found. BMET  Lab 09/26/11 0600 09/25/11 0515 09/24/11 0930 09/23/11 0500 09/22/11 0500 09/21/11 0638  NA 139 138 139 139 140 139  K 3.4* 3.4* 3.3* 3.1* 3.2* 3.6  CL 103 102 101 102 103 103  CO2 25 28 24 26 28 24   GLUCOSE 85 84 89 86 91 96  BUN 13 10 27* 22 18 41*  CREATININE 3.90* 2.87* 5.44* 4.29* 3.31* 5.61*  ALB -- -- -- -- -- --  CALCIUM 10.7* 9.6 12.3* 11.2* 10.4 13.1*  PHOS 2.7 2.0* 3.5 3.0 2.3 3.4   CBC  Lab 09/24/11 0930 09/23/11 0500 09/21/11 0638  WBC 11.6* 12.5* 13.0*  NEUTROABS -- -- --  HGB 9.5* 8.9* 8.8*  HCT 28.6* 26.7* 26.3*  MCV 86.7 87.3 86.5  PLT 244 205 217     Assessment/Plan:  1. Acute on CRF (CKD baseline creat 2.3)- Plan is for vein mapping today, and TDC/perm access placement on Friday.  CLIP in process for outpt HD in conjunction with SNF placement.  No signs of recovery.  Tobra level low at 1.6 c/w significant time out from placement of impregnated  cement/beads in hip, no indication for removal at this time.  HD tomorrow after cath placement.  2. Hypercalcemia- suspected likely from immobilization. Myeloma workup negative.  S/P calcitonin. Ca+ increasing, will dialyze on 2.0 Ca++ bath.  3. 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 Alvan Dame with sterile seroma; wound VAC in place.   4. HTN  5. Anemia- ABLA and acute illness with CKD at baseline, transfuse as needed; Aranesp started; iron OK 6. Gout on allopurinol, reduced dose is appropriate  7. Resolved PNA 8. Volume- improved 9. PICC line- will have this removed after HD cath placed tomorrow    Kelly Splinter  MD [facility] [phone] pgr    [phone] cell 09/27/2011, 1:41 PM

## 2011-09-27 NOTE — Progress Notes (Addendum)
Subjective: 12 Days Post-Op Procedure(s) (LRB): IRRIGATION AND DEBRIDEMENT EXTREMITY (Right)    Objective:   VITALS:   Filed Vitals:   09/27/11 1319  BP: 143/70  Pulse: 20  Temp: 97.4 F (36.3 C)  Resp: 18     LABS No new labs   Assessment/Plan: 12 Days Post-Op Procedure(s) (LRB): IRRIGATION AND DEBRIDEMENT EXTREMITY (Right)    Remove wound vac today Wet to dry dressing bid Follow up in 2 weeks at [facility].  Follow-up Information    Follow up with OLIN,Kendrea Cerritos D in 2 weeks.   Contact information:   [facility] [address] [place] Edward Trevino   PAC  09/27/2011, 3:03 PM

## 2011-09-27 NOTE — Progress Notes (Signed)
Patient ID: [name]  female  [id number]    DOB: 08/29/1945    DOA: 09/10/2011  PCP: Garnet Koyanagi, DO, DO  Subjective: No complaints  Objective: Weight change:   Intake/Output Summary (Last 24 hours) at 09/27/11 1446 Last data filed at 09/27/11 1300  Gross per 24 hour  Intake    600 ml  Output   1500 ml  Net   -900 ml   Blood pressure 143/70, pulse 20, temperature 97.4 F (36.3 C), temperature source Oral, resp. rate 18, height 5\' 5"  (1.651 m), weight 106.5 kg (234 lb 12.6 oz), SpO2 100.00%.  Physical Exam: General: Alert and awake, oriented x3, not in any acute distress. HEENT: anicteric sclera, pupils reactive to light and accommodation, EOMI CVS: S1-S2 clear, no murmur rubs or gallops Chest: clear to auscultation bilaterally, no wheezing, rales or rhonchi Abdomen: soft nontender, nondistended, normal bowel sounds, no organomegaly Extremities: no cyanosis, clubbing or edema noted bilaterally, drain/wound VAC in right lower extremity Neuro: Cranial nerves II-XII intact, no focal neurological deficits  Lab Results: Basic Metabolic Panel:  Lab [id number] 0600 09/25/11 0515  NA 139 138  K 3.4* 3.4*  CL 103 102  CO2 25 28  GLUCOSE 85 84  BUN 13 10  CREATININE 3.90* 2.87*  CALCIUM 10.7* 9.6  MG -- --  PHOS 2.7 --   Liver Function Tests:  Lab 09/26/11 0600 09/25/11 0515  AST -- --  ALT -- --  ALKPHOS -- --  BILITOT -- --  PROT -- --  ALBUMIN 2.3* 2.2*   CBC:  Lab 09/24/11 0930 09/23/11 0500  WBC 11.6* 12.5*  NEUTROABS -- --  HGB 9.5* 8.9*  HCT 28.6* 26.7*  MCV 86.7 87.3  PLT 244 205  CBG:  Lab 09/21/11 0709 09/20/11 1638  GLUCAP 99 123*     Micro Results:  Studies/Results: Dg Chest 1 View  09/10/2011  *RADIOLOGY REPORT*  Clinical Data: Altered mental status.  CHEST - 1 VIEW  Comparison: 09/04/2011  Findings: The patient has a right-sided PICC line, tip to the superior vena cava.  The heart is enlarged.  There is dense opacity at the left lung  base which partially obscures the lateral hemidiaphragm.  Findings are consistent with infectious infiltrate. No evidence for pulmonary edema.  No evidence for pneumothorax. There is degenerative change in the spine.  IMPRESSION:  1.  Cardiomegaly without pulmonary edema. 2.  Left lower lobe infiltrate.  Original Report Authenticated By: Glenice Bow, M.D.   Ct Head Wo Contrast  09/12/2011  *RADIOLOGY REPORT*  Clinical Data: 66 year old female with decreased level of consciousness.  CT HEAD WITHOUT CONTRAST  Technique:  Contiguous axial images were obtained from the base of the skull through the vertex without contrast.  Comparison: 09/10/2011 and earlier.  Findings: Mild bubbly opacity in the right sphenoid.  Other Visualized paranasal sinuses and mastoids are clear.  The patient is intubated.  No acute scalp or orbit soft tissue findings. No acute osseous abnormality identified.  Stable cerebral volume.  No ventriculomegaly. Stable gray-white matter differentiation throughout the brain.  No midline shift, mass effect, or evidence of mass lesion.  No acute intracranial hemorrhage identified.  No evidence of cortically based acute infarction identified.  No suspicious intracranial vascular hyperdensity.  IMPRESSION: 1.  Stable noncontrast CT appearance the brain without acute intracranial abnormality. 2.  Mild paranasal sinus inflammatory changes in the setting of intubation.  Original Report Authenticated By: Randall An, M.D.   Ct Head Wo  Contrast  09/10/2011  *RADIOLOGY REPORT*  Clinical Data: Confusion, weakness, previous stroke.  CT HEAD WITHOUT CONTRAST  Technique:  Contiguous axial images were obtained from the base of the skull through the vertex without contrast.  Comparison: 09/23/2008  Findings: The previously noted right thalamic hemorrhage has resolved. Atherosclerotic and physiologic intracranial calcifications.  Mild atrophy . There is no evidence of acute intracranial hemorrhage, brain  edema, mass lesion, acute infarction,   mass effect, or midline shift. Acute infarct may be inapparent on noncontrast CT.  No other intra-axial abnormalities are seen, and the ventricles and sulci are within normal limits in size and symmetry.   No abnormal extra-axial fluid collections or masses are identified.  No significant calvarial abnormality.  IMPRESSION: 1. Negative for bleed or other acute intracranial process.  Original Report Authenticated By: Trecia Rogers, M.D.   US Abdomen Complete  09/11/2011  *RADIOLOGY REPORT*  Clinical Data:  Elevated INR, liver disease, morbid obesity, prior cholecystectomy  ULTRASOUND ABDOMEN:  Technique:  Sonography of upper abdominal structures was performed. Examination limited by body habitus and bowel gas.  Comparison:  Renal ultrasound 09/03/2006  Gallbladder:  Surgically absent  Common bile duct:  8 mm diameter, may be normal post cholecystectomy  Liver:  Normal appearance  IVC:  Normal appearance  Pancreas:  Suboptimally visualized due to bowel gas, with portions of head and tail obscured.  Visualized portions normal appearance.  Spleen:  Normal appearance, 5.9 cm length  Right kidney:  10.8 cm length.  Increased cortical echogenicity. Small cyst 1.1 x 1.2 x 1.0 cm.  No gross hydronephrosis or shadowing calcification.  Left kidney:  Suboptimally visualized.  Approximately 10.7 cm length.  Marked cortical thinning.  Increased cortical echogenicity.  No gross mass or hydronephrosis seen on limited assessment.  Aorta:  Proximally normal caliber, remainder obscured by bowel gas.  Other:  No free fluid  IMPRESSION: Medical renal disease changes of both kidneys with cortical atrophy of left kidney. Post cholecystectomy with slightly dilated common bile duct 8 mm diameter, which may be physiologic; recommend correlation with LFTs. Incomplete visualization of pancreas, aorta and left kidney.  Original Report Authenticated By: Burnetta Sabin, M.D.   Dg Chest Port 1  View  09/16/2011  *RADIOLOGY REPORT*  Clinical Data: Respiratory failure  PORTABLE CHEST - 1 VIEW  Comparison:   the previous day's study  Findings: Right IJ central line and left to right arm PICC remain in place.  The patient has been extubated and the nasogastric tube removed.  Stable cardiomegaly.  Cannot exclude small left pleural effusion.  Slightly improved aeration with concomitant decrease in perihilar and bibasilar atelectasis or interstitial infiltrates.  IMPRESSION:  1.  Extubation with slightly improved aeration.  Original Report Authenticated By: Trecia Rogers, M.D.   Dg Chest Port 1 View  09/15/2011  *RADIOLOGY REPORT*  Clinical Data: Intubated  PORTABLE CHEST - 1 VIEW  Comparison:   the previous day's study  Findings: Endotracheal tube, nasogastric tube, and right IJ central line are stable in position.  Stable cardiomegaly.  Relatively low lung volumes with persistent perihilar and bibasilar atelectasis or interstitial infiltrates.  Cannot exclude small left pleural effusion.  IMPRESSION:  1.  Little change from previous day's portable exam.  Original Report Authenticated By: Trecia Rogers, M.D.   Dg Chest Port 1 View  09/14/2011  *RADIOLOGY REPORT*  Clinical Data: Follow up of infiltrate.  Shortness of breath. Morbid obesity.  PORTABLE CHEST - 1 VIEW  Comparison: 1 day prior  Findings: Endotracheal tube is unchanged, with tip at the level of the clavicles.  Nasogastric tube extends beyond the inferior aspect of the film.  Right IJ central line unchanged.  Mild cardiomegaly.  No right pleural effusion.  Cannot exclude small left pleural effusion. No pneumothorax.  Low lung volumes. Interval development of mild pulmonary venous congestion.  Mild bibasilar volume loss.  IMPRESSION: Cardiomegaly and low lung volumes with mild pulmonary venous congestion.  Similar patchy bibasilar atelectasis.  Possible small left pleural effusion.  Original Report Authenticated By: Areta Haber,  M.D.   Dg Chest Port 1 View  09/13/2011  *RADIOLOGY REPORT*  Clinical Data: Endotracheal tube.  PORTABLE CHEST - 1 VIEW  Comparison: 09/12/2011.  Findings: Endotracheal tube is in satisfactory position. Nasogastric tube is followed into the stomach, with the tip projecting beyond the inferior boundary of the film.  Right IJ central line and right PICC tips project over the SVC.  Heart is at the upper limits of normal in size, stable.  Left perihilar and right infrahilar air space disease persists.  Question left pleural effusion.  IMPRESSION: Left perihilar and right basilar airspace disease persist. Probable left pleural effusion.  Original Report Authenticated By: Luretha Rued, M.D.   Dg Chest Port 1 View  09/12/2011  *RADIOLOGY REPORT*  Clinical Data: Respiratory failure and status post intubation.  PORTABLE CHEST - 1 VIEW  Comparison: Film at 1030 hours.  Findings: Endotracheal tube present with the tip approximately 2.5 cm above the carina.  Stable positioning of temporary dialysis catheter with the tip in the lower SVC.  Additional right-sided PICC line tip continues to lie at the level of the proximal SVC. Nasogastric tube continues to extend into the stomach.  Lungs show stable cardiomegaly and left lower lobe atelectasis.  No overt edema present.  No evidence of pneumothorax.  No significant pleural fluid.  IMPRESSION: Endotracheal tube appropriately positioned with tip 2.5 cm above the carina.  Otherwise no significant change in appearance of the chest.  Original Report Authenticated By: Azzie Roup, M.D.   Dg Chest Port 1 View  09/12/2011  *RADIOLOGY REPORT*  Clinical Data: Line placement.  PORTABLE CHEST - 1 VIEW  Comparison: 09/12/2011  Findings: Right central line placement.  The tip is in the SVC.  No pneumothorax.  Right PICC line is unchanged.  Cardiomegaly.  Mild vascular congestion. No effusions.  Probable left basilar opacity, unchanged.  No overt edema.  IMPRESSION: Right  central line tip in the SVC.  No pneumothorax.  Otherwise no change.  Original Report Authenticated By: Raelyn Number, M.D.   Dg Chest Port 1 View  09/12/2011  *RADIOLOGY REPORT*  Clinical Data: Edema, renal failure  PORTABLE CHEST - 1 VIEW  Comparison: 09/10/2011  Findings: Cardiomegaly again noted.  No pulmonary edema.  Stable right arm PICC line position with tip in upper SVC.  Stable left basilar atelectasis or infiltrate.  IMPRESSION: No pulmonary edema.  Again noted left basilar atelectasis or infiltrate.  Original Report Authenticated By: Lahoma Crocker, M.D.   Dg Chest Port 1 View  09/04/2011  *RADIOLOGY REPORT*  Clinical Data: For central line placement  PORTABLE CHEST - 1 VIEW  Comparison: Portable chest x-ray of 11/21 for a 1012  Findings: The right upper extremity PICC line tip is in the mid lower SVC.  No pneumothorax is seen.  Cardiomegaly is stable.  A lap band is present.  IMPRESSION: Right PICC line tip in mid lower  SVC.  No pneumothorax.  Original Report Authenticated By: Joretta Bachelor, M.D.   Dg Bone Survey Met  09/22/2011  *RADIOLOGY REPORT*  Clinical Data: Bone pain.  Right hip pain.  Clinical concern for multiple myeloma.  METASTATIC BONE SURVEY  Comparison: Previous related examinations.  Findings:  Lateral skull:  No lytic lesions.  AP and lateral cervical spine:  Multilevel degenerative changes. Mild scoliosis.  Mild reversal of the normal cervical lordosis.  No lytic lesions.  AP and lateral thoracic spine:  Multilevel degenerative changes, including changes of DISH.  No lytic lesions.  AP and lateral lumbar spine:  Mild levoconvex rotary scoliosis. Multilevel degenerative changes.  No lytic lesions.  AP pelvis:  Absence of the proximal right femur with extensive soft tissue calcific density suggesting methylmethacrylate.  No lytic lesions.  AP femurs:  Absence of the proximal right femur with screw holes in the remaining midportion of the right femur.  Extensive calcific density in the  soft tissues compatible with methylmethacrylate. Unremarkable proximal left femur.  No lytic lesions.  AP humeri:  Normal, without lytic lesions.  AP shoulders:  Mild bilateral acromioclavicular spur formation.  No lytic lesions.  IMPRESSION: No lytic lesions or other findings suspicious for multiple myeloma.  Original Report Authenticated By: Gerald Stabs, M.D.    Medications: Scheduled Meds:   . amLODipine  5 mg Oral Daily  . cefTAZidime (FORTAZ)  IV  2 g Intravenous Q48H  . darbepoetin (ARANESP) injection - NON-DIALYSIS  200 mcg Subcutaneous Q Thu-1800  . diltiazem  240 mg Oral Daily  . metoprolol tartrate  50 mg Oral BID  . sotalol  80 mg Oral Q48H   Continuous Infusions:   . sodium chloride 10 mL/hr at 09/26/11 0959     Assessment/Plan:  1. Acute on chronic respiratory failure, history of asthma-was on prednisone until 09/17/11. No current issues and stable  2. Encephalopathy-resolved, was secondary to possible renal insufficiency.issues.   3. 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 and antibiotic beads placed and 3/12-had washout 3/2 with non-purulence and seroma -ID consulted and have recommended 6 weeks total of antibiotics finishing 10/16/2011-will need to continue ceftaz time every 48 hourly with renal dosing.  - wound VAC in place, Orthopedics to determine if Stimulan beads placed 3/2 need to be removed, once Vancomycin and tobramycin levels return and if patient needs wound VAC at disposition to SNF  4. Acute renal insufficiency on CKD. stage III, baseline creatinine 2.3:  Cause remains undefined, creatinine worse after hip surgery on 09/03/2011, likely Vancomycin/Tobramycin toxicity vs ATN, AIN - vein mapping done, AV fistula placement tomorrow, outpatient HD spot in process   5. Hypercalcemia-calcium 9.6, but prior to dilaysis yesterday was 12.3. Suspected from immobilization, myeloma workup negative  (Oncology recommended doing a 24-hour urine protein electrophoresis which was essentially negative. Skeletal survey done negative for Myleoma) - per renal service.   6. ABLA/Iron deficiency-her baseline seems to be between 8 and 9, transfused this admission.  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 Leukocytosis-improving, afebrile   8. Atrial fibrillation-rate controlled  - on CCB, BB, not on coumadin.  9. Obstructive sleep apnea-continue CPAP  10. Hypertension-stable   DVT Prophylaxis: SCD's  Code Status: Full Code  Disposition: After access placement  And outpatient HD spot available   LOS: 24 days   Debbi Strandberg M.D. [facility] 09/27/2011, 2:46 PM Pager: [phone]

## 2011-09-27 NOTE — Progress Notes (Signed)
Patient to be discharged to [facility]. Social worker request consults to surgeon regarding wound vac necessity. MD, Dr. Alvan Dame, with call back stated to consult Lindsay for possible d/c of wound vac and wet to dry dressings. [place] notified to round in the AM.   [name], Jane [place]

## 2011-09-27 NOTE — Progress Notes (Signed)
Pt placed on CPAP at previous settings and tolerating well. RT will continue to monitor.

## 2011-09-27 NOTE — Progress Notes (Signed)
Nutrition Follow-up  Intake remains to be 50-75% of meals. Likely to have permanent fistula placed this Friday. Pt has received renal education briefly at last visit. This RD provided Choose-A-Meal booklet during this visit with extensive education on high and low sources of potassium and phosphorus foods.   RN reports pt is not eating very well, despite documented intake of 50-75% of meals.  Diet Order:  NPO  Meds: Scheduled Meds:   . amLODipine  5 mg Oral Daily  . cefTAZidime (FORTAZ)  IV  2 g Intravenous Q48H  . darbepoetin (ARANESP) injection - NON-DIALYSIS  200 mcg Subcutaneous Q Thu-1800  . diltiazem  240 mg Oral Daily  . metoprolol tartrate  50 mg Oral BID  . sotalol  80 mg Oral Q48H   Continuous Infusions:   . sodium chloride 10 mL/hr at 09/26/11 0959   PRN Meds:.fentaNYL, heparin, HYDROcodone-acetaminophen, lidocaine, lidocaine-prilocaine, nystatin, ondansetron, pentafluoroprop-tetrafluoroeth, sodium chloride  Labs:  CMP     Component Value Date/Time   NA 139 09/26/2011 0600   K 3.4* 09/26/2011 0600   CL 103 09/26/2011 0600   CO2 25 09/26/2011 0600   GLUCOSE 85 09/26/2011 0600   BUN 13 09/26/2011 0600   CREATININE 3.90* 09/26/2011 0600   CALCIUM 10.7* 09/26/2011 0600   CALCIUM 11.1* 09/11/2011 0825   PROT 5.5* 09/10/2011 2000   ALBUMIN 2.3* 09/26/2011 0600   AST 24 09/10/2011 2000   ALT 9 09/10/2011 2000   ALKPHOS 103 09/10/2011 2000   BILITOT 0.2* 09/10/2011 2000   GFRNONAA 11* 09/26/2011 0600   GFRAA 13* 09/26/2011 0600  Phosphorus 2.7   Intake/Output Summary (Last 24 hours) at 09/27/11 1528 Last data filed at 09/27/11 1300  Gross per 24 hour  Intake    600 ml  Output    650 ml  Net    -50 ml   Weight Status:  106.5 kg s/p HD on 3/12 112.2 kg on 3/5 (post-dialysis)  Re-estimated needs (based on HD): 2000-2200 kcal, 95-110 grams protein, 1.2 L fluid  Nutrition Dx:  1. Inadequate oral intake, progressing.  2. Food and nutrition related knowledge deficit r/t lack of  prior nutrition-related knowledge AEB pt report. Met.  Goal:   1. PO intake to meet >/=90% of estimated nutrition needs. Unmet. 2. Pt to verbalize basic understanding of renal diet. Met.  Intervention:   1. Recommend CHO Modified High Diet to better meet kcal needs 2. RD provided extensive renal education this visit with Choose a Meal booklet 3. Resource Breeze PO BID to promote additional protein and kcal  Monitor:  PO intake, weight status, labs, I/O's  Asencion Partridge Pager #:  [phone]

## 2011-09-28 ENCOUNTER — Encounter (HOSPITAL_COMMUNITY): Payer: Self-pay | Admitting: Anesthesiology

## 2011-09-28 ENCOUNTER — Inpatient Hospital Stay (HOSPITAL_COMMUNITY): Payer: Medicare Other

## 2011-09-28 ENCOUNTER — Encounter (HOSPITAL_COMMUNITY)
Admission: EM | Disposition: A | Discharge status: Skilled Nursing Facility | Payer: Self-pay | Source: Home / Self Care | Attending: Family Medicine

## 2011-09-28 ENCOUNTER — Inpatient Hospital Stay (HOSPITAL_COMMUNITY): Payer: Medicare Other | Admitting: Anesthesiology

## 2011-09-28 HISTORY — PX: AV FISTULA PLACEMENT: SHX1204

## 2011-09-28 LAB — RENAL FUNCTION PANEL
Albumin: 2.2 g/dL — ABNORMAL LOW (ref 3.5–5.2)
BUN: 22 mg/dL (ref 6–23)
CO2: 26 mEq/L (ref 19–32)
Calcium: 10.8 mg/dL — ABNORMAL HIGH (ref 8.4–10.5)
Chloride: 103 mEq/L (ref 96–112)
Creatinine, Ser: 5.56 mg/dL — ABNORMAL HIGH (ref 0.50–1.10)
GFR calc Af Amer: 9 mL/min — ABNORMAL LOW (ref >90)
GFR calc non Af Amer: 7 mL/min — ABNORMAL LOW (ref >90)
Glucose, Bld: 86 mg/dL (ref 70–99)
Phosphorus: 4.2 mg/dL (ref 2.3–4.6)
Potassium: 3.5 mEq/L (ref 3.5–5.1)
Sodium: 139 mEq/L (ref 135–145)

## 2011-09-28 LAB — CBC
HCT: 29 % — ABNORMAL LOW (ref 36.0–46.0)
Hemoglobin: 9.5 g/dL — ABNORMAL LOW (ref 12.0–15.0)
MCH: 28.6 pg (ref 26.0–34.0)
MCHC: 32.8 g/dL (ref 30.0–36.0)
MCV: 87.3 fL (ref 78.0–100.0)
Platelets: 195 10*3/uL (ref 150–400)
RBC: 3.32 MIL/uL — ABNORMAL LOW (ref 3.87–5.11)
RDW: 17.4 % — ABNORMAL HIGH (ref 11.5–15.5)
WBC: 10.2 10*3/uL (ref 4.0–10.5)

## 2011-09-28 LAB — SURGICAL PCR SCREEN
MRSA, PCR: NEGATIVE
Staphylococcus aureus: NEGATIVE

## 2011-09-28 IMAGING — CR DG CHEST 1V PORT
1 series · 1 of 1 positions shown · non-contrast
Comparison: [DATE]

CLINICAL DATA: Dialysis catheter placement.

PORTABLE CHEST - 1 VIEW

[view not recorded]
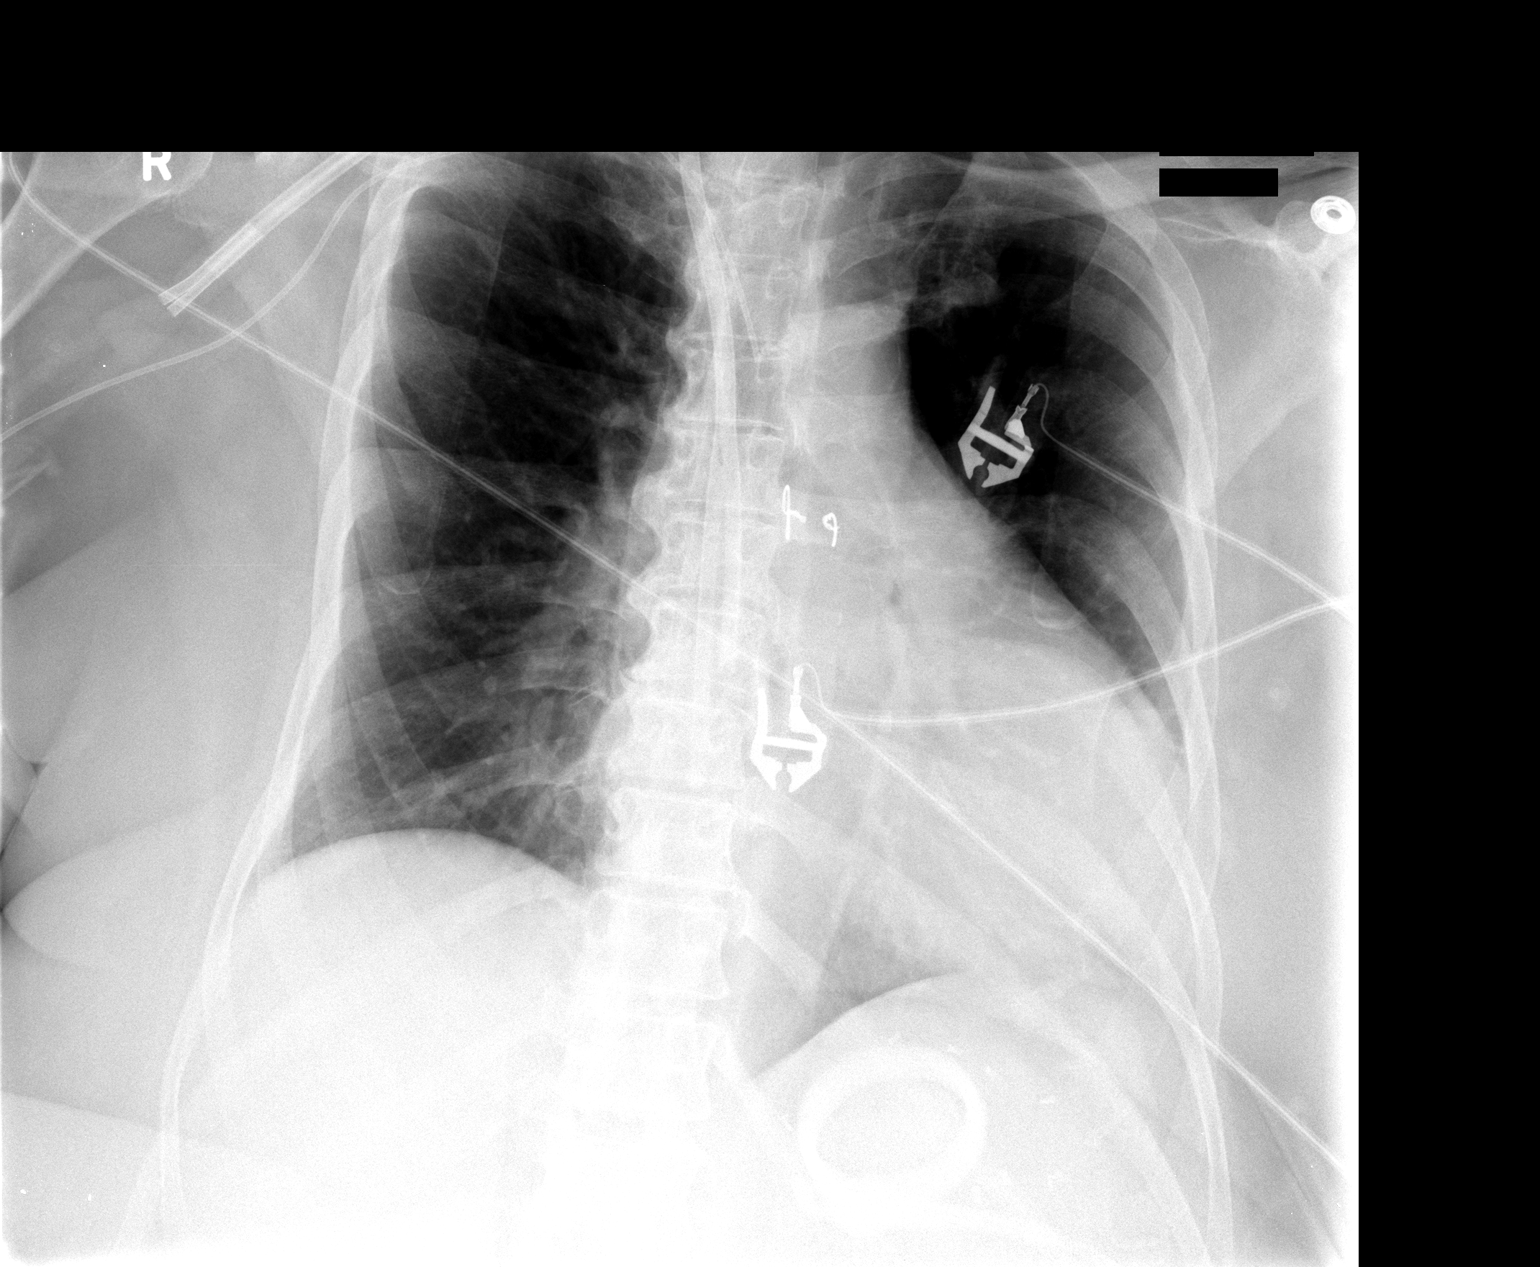

[1 of 1 positions shown; findings below may reference images not displayed]

FINDINGS: Right dialysis catheter has been placed.  The tips are in
the upper right atrium.  No pneumothorax.  Right PICC line is
unchanged.  No pneumothorax.  Cardiomegaly.  Lungs are clear.
IMPRESSION: Right dialysis catheter tip in the upper right atrium.  No
pneumothorax.  Otherwise no change.

## 2011-09-28 SURGERY — ARTERIOVENOUS (AV) FISTULA CREATION
Anesthesia: Monitor Anesthesia Care | Site: Neck | Laterality: Right | Wound class: Clean

## 2011-09-28 MED ORDER — 0.9 % SODIUM CHLORIDE (POUR BTL) OPTIME
TOPICAL | Status: DC | PRN
  Administered 2011-09-28: 1000 mL

## 2011-09-28 MED ORDER — HEPARIN SODIUM (PORCINE) 1000 UNIT/ML IJ SOLN
INTRAMUSCULAR | Status: DC | PRN
  Administered 2011-09-28: 4.6 mL

## 2011-09-28 MED ORDER — LIDOCAINE-EPINEPHRINE 0.5-1:200000 % IJ SOLN
INTRAMUSCULAR | Status: DC | PRN
  Administered 2011-09-28: 21 mL

## 2011-09-28 MED ORDER — DROPERIDOL 2.5 MG/ML IJ SOLN
0.6250 mg | INTRAMUSCULAR | Status: DC | PRN
  Filled 2011-09-28: qty 0.25

## 2011-09-28 MED ORDER — CEFAZOLIN SODIUM 1-5 GM-% IV SOLN
INTRAVENOUS | Status: AC
  Filled 2011-09-28: qty 50

## 2011-09-28 MED ORDER — SODIUM CHLORIDE 0.9 % IR SOLN
Status: DC | PRN
  Administered 2011-09-28: 09:00:00

## 2011-09-28 MED ORDER — CEFAZOLIN SODIUM 1-5 GM-% IV SOLN
INTRAVENOUS | Status: DC | PRN
  Administered 2011-09-28: 1 g via INTRAVENOUS

## 2011-09-28 MED ORDER — PROPOFOL 10 MG/ML IV EMUL
INTRAVENOUS | Status: DC | PRN
  Administered 2011-09-28 (×2): 25 ug/kg/min via INTRAVENOUS

## 2011-09-28 MED ORDER — HYDROMORPHONE HCL PF 1 MG/ML IJ SOLN
0.2500 mg | INTRAMUSCULAR | Status: DC | PRN
  Administered 2011-09-28 (×2): 0.5 mg via INTRAVENOUS

## 2011-09-28 MED ORDER — DARBEPOETIN ALFA-POLYSORBATE 200 MCG/0.4ML IJ SOLN
INTRAMUSCULAR | Status: AC
  Filled 2011-09-28: qty 0.4

## 2011-09-28 MED ORDER — MIDAZOLAM HCL 5 MG/5ML IJ SOLN
INTRAMUSCULAR | Status: DC | PRN
  Administered 2011-09-28 (×2): 1 mg via INTRAVENOUS

## 2011-09-28 MED ORDER — FENTANYL CITRATE 0.05 MG/ML IJ SOLN
INTRAMUSCULAR | Status: DC | PRN
  Administered 2011-09-28 (×3): 50 ug via INTRAVENOUS

## 2011-09-28 MED ORDER — ONDANSETRON HCL 4 MG/2ML IJ SOLN
INTRAMUSCULAR | Status: DC | PRN
  Administered 2011-09-28: 4 mg via INTRAVENOUS

## 2011-09-28 MED ORDER — SODIUM CHLORIDE 0.9 % IV SOLN
INTRAVENOUS | Status: DC | PRN
  Administered 2011-09-28: 07:00:00 via INTRAVENOUS

## 2011-09-28 SURGICAL SUPPLY — 59 items
BAG DECANTER FOR FLEXI CONT (MISCELLANEOUS) IMPLANT
BENZOIN TINCTURE PRP APPL 2/3 (GAUZE/BANDAGES/DRESSINGS) ×3 IMPLANT
CANISTER SUCTION 2500CC (MISCELLANEOUS) ×3 IMPLANT
CATH CANNON HEMO 15F 50CM (CATHETERS) IMPLANT
CATH CANNON HEMO 15FR 19 (HEMODIALYSIS SUPPLIES) IMPLANT
CATH CANNON HEMO 15FR 23CM (HEMODIALYSIS SUPPLIES) ×3 IMPLANT
CATH CANNON HEMO 15FR 31CM (HEMODIALYSIS SUPPLIES) IMPLANT
CATH CANNON HEMO 15FR 32CM (HEMODIALYSIS SUPPLIES) IMPLANT
CLIP LIGATING EXTRA MED SLVR (CLIP) ×3 IMPLANT
CLIP LIGATING EXTRA SM BLUE (MISCELLANEOUS) ×3 IMPLANT
CLOTH BEACON ORANGE TIMEOUT ST (SAFETY) ×3 IMPLANT
CLSR STERI-STRIP ANTIMIC 1/2X4 (GAUZE/BANDAGES/DRESSINGS) ×3 IMPLANT
COVER PROBE W GEL 5X96 (DRAPES) IMPLANT
COVER SURGICAL LIGHT HANDLE (MISCELLANEOUS) ×6 IMPLANT
DECANTER SPIKE VIAL GLASS SM (MISCELLANEOUS) IMPLANT
DRAPE C-ARM 42X72 X-RAY (DRAPES) ×3 IMPLANT
DRAPE CHEST BREAST 15X10 FENES (DRAPES) ×3 IMPLANT
ELECT REM PT RETURN 9FT ADLT (ELECTROSURGICAL) ×3
ELECTRODE REM PT RTRN 9FT ADLT (ELECTROSURGICAL) ×2 IMPLANT
GAUZE SPONGE 2X2 8PLY STRL LF (GAUZE/BANDAGES/DRESSINGS) ×2 IMPLANT
GAUZE SPONGE 4X4 16PLY XRAY LF (GAUZE/BANDAGES/DRESSINGS) IMPLANT
GEL ULTRASOUND 20GR AQUASONIC (MISCELLANEOUS) IMPLANT
GLOVE BIOGEL PI IND STRL 6.5 (GLOVE) ×8 IMPLANT
GLOVE BIOGEL PI IND STRL 7.5 (GLOVE) ×2 IMPLANT
GLOVE BIOGEL PI INDICATOR 6.5 (GLOVE) ×4
GLOVE BIOGEL PI INDICATOR 7.5 (GLOVE) ×1
GLOVE ECLIPSE 6.0 STRL STRAW (GLOVE) ×3 IMPLANT
GLOVE SS BIOGEL STRL SZ 7.5 (GLOVE) ×6 IMPLANT
GLOVE SUPERSENSE BIOGEL SZ 7.5 (GLOVE) ×3
GLOVE SURG SS PI 7.5 STRL IVOR (GLOVE) ×9 IMPLANT
GOWN STRL NON-REIN LRG LVL3 (GOWN DISPOSABLE) ×6 IMPLANT
KIT BASIN OR (CUSTOM PROCEDURE TRAY) ×3 IMPLANT
KIT ROOM TURNOVER OR (KITS) ×3 IMPLANT
NEEDLE 18GX1X1/2 (RX/OR ONLY) (NEEDLE) ×3 IMPLANT
NEEDLE 22X1 1/2 (OR ONLY) (NEEDLE) ×3 IMPLANT
NEEDLE HYPO 25GX1X1/2 BEV (NEEDLE) IMPLANT
NS IRRIG 1000ML POUR BTL (IV SOLUTION) ×3 IMPLANT
PACK CV ACCESS (CUSTOM PROCEDURE TRAY) ×3 IMPLANT
PACK SURGICAL SETUP 50X90 (CUSTOM PROCEDURE TRAY) IMPLANT
PAD ARMBOARD 7.5X6 YLW CONV (MISCELLANEOUS) ×6 IMPLANT
SOAP 2 % CHG 4 OZ (WOUND CARE) ×3 IMPLANT
SPONGE GAUZE 2X2 STER 10/PKG (GAUZE/BANDAGES/DRESSINGS) ×1
SPONGE GAUZE 4X4 12PLY (GAUZE/BANDAGES/DRESSINGS) ×3 IMPLANT
STRIP CLOSURE SKIN 1/2X4 (GAUZE/BANDAGES/DRESSINGS) ×3 IMPLANT
SUT ETHILON 3 0 PS 1 (SUTURE) ×3 IMPLANT
SUT PROLENE 6 0 CC (SUTURE) ×3 IMPLANT
SUT VIC AB 3-0 SH 27 (SUTURE) ×1
SUT VIC AB 3-0 SH 27X BRD (SUTURE) ×2 IMPLANT
SUT VICRYL 4-0 PS2 18IN ABS (SUTURE) ×3 IMPLANT
SYR 20CC LL (SYRINGE) ×3 IMPLANT
SYR 30ML LL (SYRINGE) IMPLANT
SYR 5ML LL (SYRINGE) ×6 IMPLANT
SYR CONTROL 10ML LL (SYRINGE) IMPLANT
SYRINGE 10CC LL (SYRINGE) IMPLANT
TAPE CLOTH SURG 4X10 WHT LF (GAUZE/BANDAGES/DRESSINGS) ×6 IMPLANT
TOWEL OR 17X24 6PK STRL BLUE (TOWEL DISPOSABLE) ×3 IMPLANT
TOWEL OR 17X26 10 PK STRL BLUE (TOWEL DISPOSABLE) ×3 IMPLANT
UNDERPAD 30X30 INCONTINENT (UNDERPADS AND DIAPERS) ×3 IMPLANT
WATER STERILE IRR 1000ML POUR (IV SOLUTION) ×3 IMPLANT

## 2011-09-28 NOTE — OR Nursing (Signed)
Dr. Donnetta Hutching left room at [phone].  Procedure end at 0930.

## 2011-09-28 NOTE — Progress Notes (Signed)
Physical Therapy Treatment Patient Details Name: [name] MRN: [id number] DOB: May 01, 1946 Today's Date: 09/28/2011  PT Assessment/Plan  PT - Assessment/Plan Comments on Treatment Session: Patient with dizziness today during treatment.  S/p right IJ hemodialysis catheter with some soreness as expected.  EOB level therapy today per patient request and HD pending. PT Plan: Discharge plan remains appropriate PT Frequency: Min 2X/week Follow Up Recommendations: Skilled nursing facility Equipment Recommended: Defer to next venue PT Goals  Acute Rehab PT Goals Pt will Roll Supine to Left Side: with rail;with min assist PT Goal: Rolling Supine to Left Side - Progress: Updated due to goal met Pt will go Supine/Side to Sit: with min assist PT Goal: Supine/Side to Sit - Progress: Updated due to goal met Pt will go Sit to Supine/Side: with min assist PT Goal: Sit to Supine/Side - Progress: Updated due to goal met  PT Treatment Precautions/Restrictions  Precautions Precautions: Fall Precaution Comments: o Restrictions Weight Bearing Restrictions: Yes RLE Weight Bearing: Touchdown weight bearing Other Position/Activity Restrictions: o Mobility (including Balance) Bed Mobility Rolling Left: With rail;3: Mod assist Rolling Left Details (indicate cue type and reason): placed blankets between knees and assisted to roll as well as to position right LE safely Supine to Sit: With rails;HOB elevated (Comment degrees);3: Mod assist Supine to Sit Details (indicate cue type and reason): HOB 30-40*; assist for right LE Sitting - Scoot to Edge of Bed: 3: Mod assist Sitting - Scoot to Edge of Bed Details (indicate cue type and reason): pulling pad under right hip to help scoot Sit to Supine: 3: Mod assist Sit to Supine - Details (indicate cue type and reason): assist for both LE's into bed, then to get hips scooted over and shoulders in middle with cues for technique Scooting to HOB: 3: Mod  assist Scooting to [facility] Details (indicate cue type and reason): pt using rails and left foot on bed to help scoot with bed in trendelenberg Transfers Lateral/Scoot Transfers: 3: Mod assist Lateral/Scoot Transfer Details (indicate cue type and reason): sitting at edge of bed scooted to right with mod assist multiple short boosts, assisting pulling on pad. educated and demonstrated lean away from side scooting towards with slight improvement.     Exercise  Total Joint Exercises Ankle Circles/Pumps: AROM;Seated;10 reps Long Arc Quad: AROM;Right;10 reps End of Session PT - End of Session Activity Tolerance: Patient limited by fatigue;Other (comment) (c/o dizziness with BP just taken prior to PT 175/75) Patient left: with call bell in reach;in bed General Behavior During Session: [facility] for tasks performed Cognition: [facility] for tasks performed  [facility] 09/28/2011, 2:59 PM

## 2011-09-28 NOTE — Progress Notes (Signed)
Responded to Spiritual Care consult request.  Pt was open to visit.  Pt spoke of major health changes.  Provided emotional and spiritual support.  Ended visit with prayer per pt request.  Will continue to follow. Please page if needed or requested.  09/28/11 1649  Clinical Encounter Type  Visited With Patient  Visit Type Initial  Referral From Physician  Spiritual Encounters  Spiritual Needs Emotional;Prayer  Stress Factors  Patient Stress Factors Health changes;Major life changes  Family Stress Factors Not reviewed   Hope Pigeon  [phone] on-call   [phone] personal pager

## 2011-09-28 NOTE — Anesthesia Postprocedure Evaluation (Signed)
Anesthesia Post Note  Patient: [name]  Procedure(s) Performed: Procedure(s) (LRB): ARTERIOVENOUS (AV) FISTULA CREATION (Left) INSERTION OF DIALYSIS CATHETER (Right)  Anesthesia type: general  Patient location: PACU  Post pain: Pain level controlled  Post assessment: Patient's Cardiovascular Status Stable  Last Vitals:  Filed Vitals:   09/28/11 0945  BP:   Pulse:   Temp: 36.6 C  Resp:     Post vital signs: Reviewed and stable  Level of consciousness: sedated  Complications: No apparent anesthesia complications

## 2011-09-28 NOTE — Interval H&P Note (Signed)
History and Physical Interval Note:  09/28/2011 7:29 AM  [name]  has presented today for surgery, with the diagnosis of ESRD  The various methods of treatment have been discussed with the patient and family. After consideration of risks, benefits and other options for treatment, the patient has consented to  Procedure(s) (LRB): ARTERIOVENOUS (AV) FISTULA CREATION (Left) INSERTION OF DIALYSIS CATHETER (Left) as a surgical intervention .  The patients' history has been reviewed, patient examined, no change in status, stable for surgery.  I have reviewed the patients' chart and labs.  Questions were answered to the patient's satisfaction.     Kashish Yglesias

## 2011-09-28 NOTE — Op Note (Signed)
OPERATIVE REPORT  DATE OF SURGERY: 09/28/2011  PATIENT: [name], 66 y.o. female MRN: [id number]  DOB: 01/27/1946  PRE-OPERATIVE DIAGNOSIS: End-stage renal disease  POST-OPERATIVE DIAGNOSIS:  Same  PROCEDURE: Right IJ hemodialysis catheter, left upper arm AV fistula  SURGEON:  Curt Jews, M.D.  PHYSICIAN ASSISTANT: Collins  ANESTHESIA:  Lidocaine with sedation  EBL: Minimal ml  Total I/O In: 620 [P.O.:120; I.V.:500] Out: 425 [Urine:375; Blood:50]  BLOOD ADMINISTERED: None  DRAINS: None known  SPECIMEN: None  COUNTS CORRECT:  YES  PLAN OF CARE: PACU with chest x-ray pending   PATIENT DISPOSITION:  PACU - hemodynamically stable  PROCEDURE DETAILS: Patient was taken to the operating room and placed in supine position where the area of the right and left neck were imaged with ultrasound. The right IJ was of good caliber it was chosen for dialysis catheter. The patient was placed in Trendelenburg position and the right and left neck and chest were prepped and draped in usual sterile fashion. Using local anesthesia and a finder needle the right internal jugular vein was accessed. Next using Seldinger technique a guidewire was passed on the level of the right atrium. A dilator and peel-away sheath was passed over the guidewire and the dilator and guidewire were removed. The 23 cm dialysis catheter was passed through the peel-away sheath which was then removed. Catheter position the level of the right atrium. The catheter was brought through a subcutaneous tunnel in a separate stab incision. A 2 lm ports were attached. Both lumens flushed and aspirated easily and were locked with 1000 unit per cc. Heparin. The catheter was dressed in the usual sterile fashion. The catheter was secured to the skin with a 3-0 nylon stitch and the entry site was closed with 4-0 subcuticular Vicryl stitch.  Transected distally and brought into approximation the brachial artery. The brachial artery was  occluded proximal and distally and was opened with an 11 blade and extended longitudinally with Potts scissors. The vein was sewn end-to-side to the artery with a running 6-0 Prolene suture. Clamps removed and excellent there was noted. The wound. With saline hemostasis was obtained with electrocautery and the wounds were closed with 3-0 Vicryl in the subcutaneous and subcuticular tissue. Benzoin and Steri-Strips were applied. Patient was transferred to the recovery room in stable condition   Curt Jews, M.D. 09/28/2011 2:07 PM

## 2011-09-28 NOTE — Preoperative (Signed)
Beta Blockers   Reason not to administer Beta Blockers:Not Applicable 

## 2011-09-28 NOTE — Consult Note (Signed)
WOC consult Note Reason for Consult: requested to reeval. Hip wound.  Pt may transfer to SNF that will not manage VAC.  Pt currently on VAC for aprox. 1.5wk. S/P I&D per Dr. Alvan Dame.  Old VAC dressing removed found that wound had not been packed completely with granufoam dressing and pt had pocket of serous drainage similar to what was in the cannister of the machine.  Drained approximately 50cc from the wound by probing at 5 oclock, serous drainage with no odor.   Wound type: surgical incision Measurement: 4.2cm x 3.0cm x 9.0 cm tunnel at 5 oclock Wound [id number], beefy red 100% granulation tissue Drainage (amount, consistency, odor) serous, clear, no odor Periwound:intact Dressing procedure/placement/frequency: recommend TID normal saline gauze packing, making sure to pack completely into tunnel/tracked area at 5 oclock and fill all of wound bed to allow for wicking of drainage from this area.  Ideally VAC sponge and NPWT would maximize this however it is very difficult to get foam into track at this time. Explained rationale for use of gauze dressings to pt. And her husband.  They verbalize understanding.  Discussed dressing changes with bedside nursing staff and detailed orders for dressing changes placed in computer.  Re consult if needed, will not follow at this time. Thanks  August Gosser [place], [place] [phone])

## 2011-09-28 NOTE — Progress Notes (Addendum)
Patient and her husband have decided to accept the bed offer at [facility] for SNF. The facility request that the patient be set up with the dialysis center next to the facility on Mon, Wed and Friday.-CSW informed facility of the bed acceptance and they will be able to accommodate the patient on Mon.  CSW will continue to follow and assist with d/c needs.

## 2011-09-28 NOTE — Transfer of Care (Signed)
Immediate Anesthesia Transfer of Care Note  Patient: [name]  Procedure(s) Performed: Procedure(s) (LRB): ARTERIOVENOUS (AV) FISTULA CREATION (Left) INSERTION OF DIALYSIS CATHETER (Right)  Patient Location: PACU  Anesthesia Type: MAC  Level of Consciousness: awake, alert , oriented and sedated  Airway & Oxygen Therapy: Patient Spontanous Breathing  Post-op Assessment: Report given to PACU RN, Post -op Vital signs reviewed and stable and Patient moving all extremities  Post vital signs: Reviewed and stable  Complications: No apparent anesthesia complications

## 2011-09-28 NOTE — Progress Notes (Signed)
ANTIBIOTIC CONSULT NOTE - FOLLOW UP  Pharmacy Consult for Ceftazadime  Indication: pseudomonas prosthetic joint infection and pneumonia  Assessment: 10 YOF s/p recent removal of hip prosthesis and placement of antibiotic spacer. Cultures grew pan-sensitive P. Aeruginosa. Discharged on 2/21 and re-presented 2/25 with confusion from NH. Vanco/levofloxacin stopped following 6 days of tx. Continues on fortaz 1gm IV q48h (Day #16/42). Pt is afebrile tmax 98.3, WBC improving (10.2<<11.6<<12.5<<13.0. Will continue to monitor and f/u plans. Pt to have insertion of dialysis cath today as well as AV Fistula.   Plan:  1. Continue Fortaz 2 gm IV q48hrs, per ID plan to continue for total of 6 weeks - stop date 10/16/11.  2. Cont to monitor renal fx, HD plan, WBC, and tx plans   Gerrit Halls, PharmD [phone] 09/26/2011,12:25 PM   Allergies  Allergen Reactions  . Coumadin Other (See Comments)    Caused her to have a stroke    Patient Measurements: Height: 5\' 5"  (165.1 cm) Weight: 234 lb 12.6 oz (106.5 kg) IBW/kg (Calculated) : 57   Vital Signs: Temp: 97.3 F (36.3 C) (03/15 1132) Temp src: Oral (03/15 1132) BP: 169/78 mmHg (03/15 1132) Pulse Rate: 77  (03/15 1132) Intake/Output from previous day: 03/14 0701 - 03/15 0700 In: 600 [P.O.:600] Out: 1075 [Urine:1075] Intake/Output from this shift: Total I/O In: 500 [I.V.:500] Out: 50 [Blood:50]  Labs:  Basename 09/28/11 0600 09/26/11 0600  WBC 10.2 --  HGB 9.5* --  PLT 195 --  LABCREA -- --  CREATININE 5.56* 3.90*   Estimated Creatinine Clearance: 12.6 ml/min (by C-G formula based on Cr of 5.56).  Basename 09/26/11 1630 09/25/11 1800  VANCOTROUGH -- --  Corlis Leak -- --  VANCORANDOM -- 6.0  GENTTROUGH -- --  GENTPEAK -- --  GENTRANDOM -- --  TOBRATROUGH -- --  TOBRAPEAK -- --  TOBRARND 1.6 --  AMIKACINPEAK -- --  AMIKACINTROU -- --  AMIKACIN -- --     Microbiology: Recent Results (from the past 720 hour(s))  GRAM  STAIN     Status: Normal   Collection Time   09/03/11  8:08 AM      Component Value Range Status Comment   Specimen Description FLUID SYNOVIAL RIGHT HIP ON SWAB   Final    Special Requests NONE   Final    Gram Stain     Final    Value: ABUNDANT WBC PRESENT, PREDOMINANTLY PMN     NO ORGANISMS SEEN     Gram Stain Report Called to,Read Back By and Verified With:     DAVENPORT,S. RN AT [id number] ON 09/03/11 BY GILLESPIE,B.   Report Status 09/03/2011 FINAL   Final   BODY FLUID CULTURE     Status: Normal   Collection Time   09/03/11  8:09 AM      Component Value Range Status Comment   Specimen Description FLUID SYNOVIAL RIGHT HIP ON SWAB   Final    Special Requests NONE   Final    Gram Stain     Final    Value: ABUNDANT WBC PRESENT, PREDOMINANTLY PMN     NO ORGANISMS SEEN     Gram Stain Report Called to,Read Back By and Verified With: Gram Stain Report Called to,Read Back By and Verified With: Carolanne Grumbling RN @ [phone] ON 09/03/11 BY Ethel Rana B Performed by [facility]   Culture     Final    Value: RARE PSEUDOMONAS AERUGINOSA     Note: CRITICAL RESULT CALLED TO, READ BACK  BY AND VERIFIED WITH: KIMBERLY OWENS   Report Status 09/06/2011 FINAL   Final    Organism ID, Bacteria PSEUDOMONAS AERUGINOSA   Final   ANAEROBIC CULTURE     Status: Normal   Collection Time   09/03/11  8:10 AM      Component Value Range Status Comment   Specimen Description FLUID SYNOVIAL RIGHT HIP   Final    Special Requests NONE   Final    Gram Stain     Final    Value: NO WBC SEEN     NO SQUAMOUS EPITHELIAL CELLS SEEN     NO ORGANISMS SEEN   Culture NO ANAEROBES ISOLATED   Final    Report Status 09/07/2011 FINAL   Final   CULTURE, BLOOD (ROUTINE X 2)     Status: Normal   Collection Time   09/10/11  7:55 PM      Component Value Range Status Comment   Specimen Description BLOOD   Final    Special Requests BOTTLES DRAWN AEROBIC AND ANAEROBIC 3CC   Final    Culture  Setup Time [id number]   Final    Culture NO  GROWTH 5 DAYS   Final    Report Status 09/17/2011 FINAL   Final   CULTURE, BLOOD (ROUTINE X 2)     Status: Normal   Collection Time   09/10/11  8:00 PM      Component Value Range Status Comment   Specimen Description BLOOD LEFT ARM   Final    Special Requests BOTTLES DRAWN AEROBIC AND ANAEROBIC 4CC   Final    Culture  Setup Time [id number]   Final    Culture NO GROWTH 5 DAYS   Final    Report Status 09/17/2011 FINAL   Final   URINE CULTURE     Status: Normal   Collection Time   09/10/11 10:53 PM      Component Value Range Status Comment   Specimen Description URINE, CATHETERIZED   Final    Special Requests NONE   Final    Culture  Setup Time [id number]   Final    Colony Count NO GROWTH   Final    Culture NO GROWTH   Final    Report Status 09/12/2011 FINAL   Final   MRSA PCR SCREENING     Status: Normal   Collection Time   09/11/11 12:48 AM      Component Value Range Status Comment   MRSA by PCR NEGATIVE  NEGATIVE  Final   CULTURE, RESPIRATORY     Status: Normal   Collection Time   09/12/11  6:53 PM      Component Value Range Status Comment   Specimen Description TRACHEAL ASPIRATE   Final    Special Requests NONE   Final    Gram Stain     Final    Value: NO WBC SEEN     MODERATE SQUAMOUS EPITHELIAL CELLS PRESENT     RARE GRAM POSITIVE COCCI IN PAIRS   Culture Non-Pathogenic Oropharyngeal-type Flora Isolated.   Final    Report Status 09/14/2011 FINAL   Final   SURGICAL PCR SCREEN     Status: Normal   Collection Time   09/28/11  6:53 AM      Component Value Range Status Comment   MRSA, PCR NEGATIVE  NEGATIVE  Final    Staphylococcus aureus NEGATIVE  NEGATIVE  Final     Anti-infectives     Start  Dose/Rate Route Frequency Ordered Stop   09/24/11 1200   cefTAZidime (FORTAZ) 2 g in dextrose 5 % 50 mL IVPB        2 g 100 mL/hr over 30 Minutes Intravenous Every 48 hours 09/22/11 1235     09/22/11 1200   cefTAZidime (FORTAZ) 2 g in dextrose 5 % 50 mL IVPB  Status:   Discontinued        2 g 100 mL/hr over 30 Minutes Intravenous Every 24 hours 09/22/11 0750 09/22/11 1235   09/21/11 1200   cefTAZidime (FORTAZ) 2 g in dextrose 5 % 50 mL IVPB  Status:  Discontinued        2 g 100 mL/hr over 30 Minutes Intravenous Every 48 hours 09/19/11 1347 09/22/11 0750   09/20/11 1200   cefTAZidime (FORTAZ) 2 g in dextrose 5 % 50 mL IVPB  Status:  Discontinued        2 g 100 mL/hr over 30 Minutes Intravenous Every 24 hours 09/19/11 1329 09/19/11 1346   09/15/11 1110   tobramycin (NEBCIN) powder  Status:  Discontinued          As needed 09/15/11 1117 09/15/11 1135   09/15/11 1110   vancomycin (VANCOCIN) powder  Status:  Discontinued          As needed 09/15/11 1118 09/15/11 1135   09/15/11 1015   vancomycin (VANCOCIN) powder 2,000 mg  Status:  Discontinued        2,000 mg Other To Surgery 09/15/11 1009 09/16/11 1018   09/15/11 1015   tobramycin (NEBCIN) powder 1.2 g  Status:  Discontinued        1.2 g Topical To Surgery 09/15/11 1010 09/16/11 1018   09/15/11 1015   tobramycin (NEBCIN) powder 1.2 g  Status:  Discontinued        1.2 g Topical To Surgery 09/15/11 1010 09/16/11 1018   09/13/11 1100   cefTAZidime (FORTAZ) 2 g in dextrose 5 % 50 mL IVPB  Status:  Discontinued        2 g 100 mL/hr over 30 Minutes Intravenous Every 48 hours 09/13/11 1016 09/19/11 1329   09/12/11 2200   levofloxacin (LEVAQUIN) IVPB 500 mg  Status:  Discontinued        500 mg 100 mL/hr over 60 Minutes Intravenous Every 48 hours 09/11/11 0042 09/17/11 1250   09/12/11 0200   ceFEPIme (MAXIPIME) 1 g in dextrose 5 % 50 mL IVPB  Status:  Discontinued        1 g 100 mL/hr over 30 Minutes Intravenous Every 24 hours 09/11/11 1003 09/13/11 0851   09/11/11 0045   cefTRIAXone (ROCEPHIN) 1 g in dextrose 5 % 50 mL IVPB  Status:  Discontinued        1 g 100 mL/hr over 30 Minutes Intravenous Daily at bedtime 09/11/11 0017 09/11/11 1003   09/10/11 2300   vancomycin (VANCOCIN) 2,000 mg in sodium  chloride 0.9 % 500 mL IVPB        2,000 mg 250 mL/hr over 120 Minutes Intravenous  Once 09/10/11 2200 09/11/11 0513   09/10/11 2145   ceFEPIme (MAXIPIME) 1 g in dextrose 5 % 50 mL IVPB        1 g 100 mL/hr over 30 Minutes Intravenous  Once 09/10/11 2138 09/11/11 0241   09/10/11 2145   Levofloxacin (LEVAQUIN) IVPB 750 mg        750 mg 100 mL/hr over 90 Minutes Intravenous  Once 09/10/11 2138 09/10/11 2323

## 2011-09-28 NOTE — H&P (View-Only) (Signed)
  VASCULAR AND VEIN SURGERY PROGRESS NOTE  Progress note  Date of Surgery: 09/10/2011 - 09/15/2011 Surgeon: Juliann Mule): [name], MD AV fistula in left arm and diatek catheter placement planned for Friday by Dr. Donnetta Hutching.    HPI: [name] is a 66 y.o. female who presents for placement of a permanent hemodialysis access. The patient is right handed. She has a PICC line in her right arm. The patient is currently on hemodialysis vja a temporary catheter in the right IJ. The cause of renal failure is thought to be secondary to multifactorial. Other chronic medical problems include recent hip infection on 6 weeks IV abx via PICC line. She also has hypertension, asthma and prior stroke. All of these are currently stable. She has a VAC dressing on her right hip from recent I and D by Dr Alvan Dame       Significant Diagnostic Studies: CBC    Component Value Date/Time   WBC 11.6* 09/24/2011 0930   RBC 3.30* 09/24/2011 0930   HGB 9.5* 09/24/2011 0930   HCT 28.6* 09/24/2011 0930   PLT 244 09/24/2011 0930   MCV 86.7 09/24/2011 0930   MCH 28.8 09/24/2011 0930   MCHC 33.2 09/24/2011 0930   RDW 17.0* 09/24/2011 0930   LYMPHSABS 2.7 09/19/2011 1830   MONOABS 2.4* 09/19/2011 1830   EOSABS 0.3 09/19/2011 1830   BASOSABS 0.1 09/19/2011 1830    BMET    Component Value Date/Time   NA 139 09/26/2011 0600   K 3.4* 09/26/2011 0600   CL 103 09/26/2011 0600   CO2 25 09/26/2011 0600   GLUCOSE 85 09/26/2011 0600   BUN 13 09/26/2011 0600   CREATININE 3.90* 09/26/2011 0600   CALCIUM 10.7* 09/26/2011 0600   CALCIUM 11.1* 09/11/2011 0825   GFRNONAA 11* 09/26/2011 0600   GFRAA 13* 09/26/2011 0600    COAG Lab Results  Component Value Date   INR 1.13 09/15/2011   INR 1.09 09/14/2011   INR 1.61* 09/11/2011   No results found for this basename: PTT     I/O last 3 completed shifts: In: 480 [P.O.:480] Out: 1300 [Urine:450; Drains:850]  Physical Examination  Patient Vitals for the past 24 hrs:  BP Temp Temp src Pulse  Resp SpO2  09/27/11 0457 134/62 mmHg 97.7 F (36.5 C) Oral 72  18  100 %  09/26/11 2351 - - - 68  18  -  09/26/11 2112 152/80 mmHg 98.5 F (36.9 C) Oral 67  18  94 %  09/26/11 1818 142/79 mmHg 97.8 F (36.6 C) Oral 75  20  98 %      Assessment/Plan  [name] is a 66 y.o. year old female who is going to under go AV fistula on the left upper ext. And diatek cath placement.  [name] [facility] 09/27/2011 7:29 AM

## 2011-09-28 NOTE — Progress Notes (Signed)
Patient ID: [name], female   DOB: [birth year], 66 y.o.   MRN: [id number] Patient ID: [name], female   DOB: 14-Jun-1946, 66 y.o.   MRN: [id number]  S: No complaints.     O:BP 176/93  Pulse 79  Temp(Src) 97.5 F (36.4 C) (Oral)  Resp 20  Ht 5\' 5"  (1.651 m)  Wt 107.4 kg (236 lb 12.4 oz)  BMI 39.40 kg/m2  SpO2 95%  Intake/Output Summary (Last 24 hours) at 09/28/11 1819 Last data filed at 09/28/11 1700  Gross per 24 hour  Intake    740 ml  Output   1100 ml  Net   -360 ml   Weight change:  Gen: sitting up with no assist at side of bed, no distress [id number] regular in rate and rhythm, heart sounds S1 and S2 normal  Resp:Diminished breath sounds bibasally, no rales/rhonchi  [id number], obese, nontender and bowel sounds are normal  Ext: 1+ lower extremity edema bilaterally      . amLODipine  5 mg Oral Daily  . cefTAZidime (FORTAZ)  IV  2 g Intravenous Q48H  . darbepoetin (ARANESP) injection - NON-DIALYSIS  200 mcg Subcutaneous Q Thu-1800  . diltiazem  240 mg Oral Daily  . feeding supplement  1 Container Oral BID  . metoprolol tartrate  50 mg Oral BID  . sotalol  80 mg Oral Q48H   Dg Chest Port 1 View  09/28/2011  *RADIOLOGY REPORT*  Clinical Data: Dialysis catheter placement.  PORTABLE CHEST - 1 VIEW  Comparison: 09/16/2011  Findings: Right dialysis catheter has been placed.  The tips are in the upper right atrium.  No pneumothorax.  Right PICC line is unchanged.  No pneumothorax.  Cardiomegaly.  Lungs are clear.  IMPRESSION: Right dialysis catheter tip in the upper right atrium.  No pneumothorax.  Otherwise no change.  Original Report Authenticated By: Raelyn Number, M.D.   Dg Fluoro Guide Cv Line-no Report  09/28/2011  CLINICAL DATA: Dialysis Catheter   FLOURO GUIDE CV LINE  Fluoroscopy was utilized by the requesting physician.  No radiographic  interpretation.     BMET  Lab 09/28/11 0600 09/26/11 0600 09/25/11 0515 09/24/11 0930 09/23/11 0500 09/22/11 0500  NA 139  139 138 139 139 140  K 3.5 3.4* 3.4* 3.3* 3.1* 3.2*  CL 103 103 102 101 102 103  CO2 26 25 28 24 26 28   GLUCOSE 86 85 84 89 86 91  BUN 22 13 10  27* 22 18  CREATININE 5.56* 3.90* 2.87* 5.44* 4.29* 3.31*  ALB -- -- -- -- -- --  CALCIUM 10.8* 10.7* 9.6 12.3* 11.2* 10.4  PHOS 4.2 2.7 2.0* 3.5 3.0 2.3   CBC  Lab 09/28/11 0600 09/24/11 0930 09/23/11 0500  WBC 10.2 11.6* 12.5*  NEUTROABS -- -- --  HGB 9.5* 9.5* 8.9*  HCT 29.0* 28.6* 26.7*  MCV 87.3 86.7 87.3  PLT 195 244 205     Assessment/Plan:  1. Acute on CRF (CKD baseline creat 2.3)- s/p new TDC and AVF today and awaiting hemodialyis tonight.  Doing well. CLIP process (outpt HD unit approval) started yesterday/today and won't be completed this week, will take up again on Monday. 2. Hypercalcemia- suspected likely from immobilization. Myeloma workup negative, PTH low at 6.7.  S/P calcitonin. Ca+ increasing, will dialyze on 2.0 Ca++ bath.  3. 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 Alvan Dame with sterile seroma; wound VAC removed  yesterday.   4. HTN- stable on norvasc, diltiazem and metoprolol 5. Anemia- ABLA and acute illness with CKD at baseline, transfuse as needed; Aranesp started; iron OK 6. Gout-  on allopurinol at reduced dose  7. Resolved PNA 8. Volume- improved 9. PICC line- will have this removed after HD cath placed tomorrow    Kelly Splinter  MD [facility] [phone] pgr    [phone] cell 09/28/2011, 6:19 PM

## 2011-09-28 NOTE — Progress Notes (Signed)
Patient ID: [name]  female  [id number]    DOB: Dec 28, 1945    DOA: 09/10/2011  PCP: Garnet Koyanagi, DO, DO  Subjective: No complaints, right IJ hemodialysis catheter placed, AV fistula in left upper arm today  Objective: Weight change:   Intake/Output Summary (Last 24 hours) at 09/28/11 1525 Last data filed at 09/28/11 1348  Gross per 24 hour  Intake    860 ml  Output   1300 ml  Net   -440 ml   Blood pressure 175/75, pulse 79, temperature 97.4 F (36.3 C), temperature source Oral, resp. rate 17, height 5\' 5"  (1.651 m), weight 106.5 kg (234 lb 12.6 oz), SpO2 96.00%.  Physical Exam: General: Alert and awake, oriented x3, not in any acute distress. HEENT: anicteric sclera, pupils reactive to light and accommodation, EOMI CVS: S1-S2 clear, no murmur rubs or gallops Chest: clear to auscultation bilaterally, no wheezing, rales or rhonchi Abdomen: soft nontender, nondistended, normal bowel sounds, no organomegaly Extremities: no cyanosis, clubbing or edema noted bilaterally, drain/wound VAC in right lower extremity Neuro: Cranial nerves II-XII intact, no focal neurological deficits  Lab Results: Basic Metabolic Panel:  Lab 0000000 0600 09/26/11 0600  NA 139 139  K 3.5 3.4*  CL 103 103  CO2 26 25  GLUCOSE 86 85  BUN 22 13  CREATININE 5.56* 3.90*  CALCIUM 10.8* 10.7*  MG -- --  PHOS 4.2 --   Liver Function Tests:  Lab 09/28/11 0600 09/26/11 0600  AST -- --  ALT -- --  ALKPHOS -- --  BILITOT -- --  PROT -- --  ALBUMIN 2.2* 2.3*   CBC:  Lab 09/28/11 0600 09/24/11 0930  WBC 10.2 11.6*  NEUTROABS -- --  HGB 9.5* 9.5*  HCT 29.0* 28.6*  MCV 87.3 86.7  PLT 195 244  CBG: No results found for this basename: GLUCAP:5 in the last 168 hours   Micro Results:  Studies/Results: Dg Chest 1 View  09/10/2011  *RADIOLOGY REPORT*  Clinical Data: Altered mental status.  CHEST - 1 VIEW  Comparison: 09/04/2011  Findings: The patient has a right-sided PICC line, tip to  the superior vena cava.  The heart is enlarged.  There is dense opacity at the left lung base which partially obscures the lateral hemidiaphragm.  Findings are consistent with infectious infiltrate. No evidence for pulmonary edema.  No evidence for pneumothorax. There is degenerative change in the spine.  IMPRESSION:  1.  Cardiomegaly without pulmonary edema. 2.  Left lower lobe infiltrate.  Original Report Authenticated By: Glenice Bow, M.D.   Ct Head Wo Contrast  09/12/2011  *RADIOLOGY REPORT*  Clinical Data: 66 year old female with decreased level of consciousness.  CT HEAD WITHOUT CONTRAST  Technique:  Contiguous axial images were obtained from the base of the skull through the vertex without contrast.  Comparison: 09/10/2011 and earlier.  Findings: Mild bubbly opacity in the right sphenoid.  Other Visualized paranasal sinuses and mastoids are clear.  The patient is intubated.  No acute scalp or orbit soft tissue findings. No acute osseous abnormality identified.  Stable cerebral volume.  No ventriculomegaly. Stable gray-white matter differentiation throughout the brain.  No midline shift, mass effect, or evidence of mass lesion.  No acute intracranial hemorrhage identified.  No evidence of cortically based acute infarction identified.  No suspicious intracranial vascular hyperdensity.  IMPRESSION: 1.  Stable noncontrast CT appearance the brain without acute intracranial abnormality. 2.  Mild paranasal sinus inflammatory changes in the setting of intubation.  Original  Report Authenticated By: Randall An, M.D.   Ct Head Wo Contrast  09/10/2011  *RADIOLOGY REPORT*  Clinical Data: Confusion, weakness, previous stroke.  CT HEAD WITHOUT CONTRAST  Technique:  Contiguous axial images were obtained from the base of the skull through the vertex without contrast.  Comparison: 09/23/2008  Findings: The previously noted right thalamic hemorrhage has resolved. Atherosclerotic and physiologic intracranial  calcifications.  Mild atrophy . There is no evidence of acute intracranial hemorrhage, brain edema, mass lesion, acute infarction,   mass effect, or midline shift. Acute infarct may be inapparent on noncontrast CT.  No other intra-axial abnormalities are seen, and the ventricles and sulci are within normal limits in size and symmetry.   No abnormal extra-axial fluid collections or masses are identified.  No significant calvarial abnormality.  IMPRESSION: 1. Negative for bleed or other acute intracranial process.  Original Report Authenticated By: Trecia Rogers, M.D.   US Abdomen Complete  09/11/2011  *RADIOLOGY REPORT*  Clinical Data:  Elevated INR, liver disease, morbid obesity, prior cholecystectomy  ULTRASOUND ABDOMEN:  Technique:  Sonography of upper abdominal structures was performed. Examination limited by body habitus and bowel gas.  Comparison:  Renal ultrasound 09/03/2006  Gallbladder:  Surgically absent  Common bile duct:  8 mm diameter, may be normal post cholecystectomy  Liver:  Normal appearance  IVC:  Normal appearance  Pancreas:  Suboptimally visualized due to bowel gas, with portions of head and tail obscured.  Visualized portions normal appearance.  Spleen:  Normal appearance, 5.9 cm length  Right kidney:  10.8 cm length.  Increased cortical echogenicity. Small cyst 1.1 x 1.2 x 1.0 cm.  No gross hydronephrosis or shadowing calcification.  Left kidney:  Suboptimally visualized.  Approximately 10.7 cm length.  Marked cortical thinning.  Increased cortical echogenicity.  No gross mass or hydronephrosis seen on limited assessment.  Aorta:  Proximally normal caliber, remainder obscured by bowel gas.  Other:  No free fluid  IMPRESSION: Medical renal disease changes of both kidneys with cortical atrophy of left kidney. Post cholecystectomy with slightly dilated common bile duct 8 mm diameter, which may be physiologic; recommend correlation with LFTs. Incomplete visualization of pancreas, aorta  and left kidney.  Original Report Authenticated By: Burnetta Sabin, M.D.   Dg Chest Port 1 View  09/16/2011  *RADIOLOGY REPORT*  Clinical Data: Respiratory failure  PORTABLE CHEST - 1 VIEW  Comparison:   the previous day's study  Findings: Right IJ central line and left to right arm PICC remain in place.  The patient has been extubated and the nasogastric tube removed.  Stable cardiomegaly.  Cannot exclude small left pleural effusion.  Slightly improved aeration with concomitant decrease in perihilar and bibasilar atelectasis or interstitial infiltrates.  IMPRESSION:  1.  Extubation with slightly improved aeration.  Original Report Authenticated By: Trecia Rogers, M.D.   Dg Chest Port 1 View  09/15/2011  *RADIOLOGY REPORT*  Clinical Data: Intubated  PORTABLE CHEST - 1 VIEW  Comparison:   the previous day's study  Findings: Endotracheal tube, nasogastric tube, and right IJ central line are stable in position.  Stable cardiomegaly.  Relatively low lung volumes with persistent perihilar and bibasilar atelectasis or interstitial infiltrates.  Cannot exclude small left pleural effusion.  IMPRESSION:  1.  Little change from previous day's portable exam.  Original Report Authenticated By: Trecia Rogers, M.D.   Dg Chest Port 1 View  09/14/2011  *RADIOLOGY REPORT*  Clinical Data: Follow up of infiltrate.  Shortness of breath. Morbid obesity.  PORTABLE CHEST - 1 VIEW  Comparison: 1 day prior  Findings: Endotracheal tube is unchanged, with tip at the level of the clavicles.  Nasogastric tube extends beyond the inferior aspect of the film.  Right IJ central line unchanged.  Mild cardiomegaly.  No right pleural effusion.  Cannot exclude small left pleural effusion. No pneumothorax.  Low lung volumes. Interval development of mild pulmonary venous congestion.  Mild bibasilar volume loss.  IMPRESSION: Cardiomegaly and low lung volumes with mild pulmonary venous congestion.  Similar patchy bibasilar atelectasis.   Possible small left pleural effusion.  Original Report Authenticated By: Areta Haber, M.D.   Dg Chest Port 1 View  09/13/2011  *RADIOLOGY REPORT*  Clinical Data: Endotracheal tube.  PORTABLE CHEST - 1 VIEW  Comparison: 09/12/2011.  Findings: Endotracheal tube is in satisfactory position. Nasogastric tube is followed into the stomach, with the tip projecting beyond the inferior boundary of the film.  Right IJ central line and right PICC tips project over the SVC.  Heart is at the upper limits of normal in size, stable.  Left perihilar and right infrahilar air space disease persists.  Question left pleural effusion.  IMPRESSION: Left perihilar and right basilar airspace disease persist. Probable left pleural effusion.  Original Report Authenticated By: Luretha Rued, M.D.   Dg Chest Port 1 View  09/12/2011  *RADIOLOGY REPORT*  Clinical Data: Respiratory failure and status post intubation.  PORTABLE CHEST - 1 VIEW  Comparison: Film at 1030 hours.  Findings: Endotracheal tube present with the tip approximately 2.5 cm above the carina.  Stable positioning of temporary dialysis catheter with the tip in the lower SVC.  Additional right-sided PICC line tip continues to lie at the level of the proximal SVC. Nasogastric tube continues to extend into the stomach.  Lungs show stable cardiomegaly and left lower lobe atelectasis.  No overt edema present.  No evidence of pneumothorax.  No significant pleural fluid.  IMPRESSION: Endotracheal tube appropriately positioned with tip 2.5 cm above the carina.  Otherwise no significant change in appearance of the chest.  Original Report Authenticated By: Azzie Roup, M.D.   Dg Chest Port 1 View  09/12/2011  *RADIOLOGY REPORT*  Clinical Data: Line placement.  PORTABLE CHEST - 1 VIEW  Comparison: 09/12/2011  Findings: Right central line placement.  The tip is in the SVC.  No pneumothorax.  Right PICC line is unchanged.  Cardiomegaly.  Mild vascular congestion. No  effusions.  Probable left basilar opacity, unchanged.  No overt edema.  IMPRESSION: Right central line tip in the SVC.  No pneumothorax.  Otherwise no change.  Original Report Authenticated By: Raelyn Number, M.D.   Dg Chest Port 1 View  09/12/2011  *RADIOLOGY REPORT*  Clinical Data: Edema, renal failure  PORTABLE CHEST - 1 VIEW  Comparison: 09/10/2011  Findings: Cardiomegaly again noted.  No pulmonary edema.  Stable right arm PICC line position with tip in upper SVC.  Stable left basilar atelectasis or infiltrate.  IMPRESSION: No pulmonary edema.  Again noted left basilar atelectasis or infiltrate.  Original Report Authenticated By: Lahoma Crocker, M.D.   Dg Chest Port 1 View  09/04/2011  *RADIOLOGY REPORT*  Clinical Data: For central line placement  PORTABLE CHEST - 1 VIEW  Comparison: Portable chest x-ray of 11/21 for a 1012  Findings: The right upper extremity PICC line tip is in the mid lower SVC.  No pneumothorax is seen.  Cardiomegaly is stable.  A lap  band is present.  IMPRESSION: Right PICC line tip in mid lower SVC.  No pneumothorax.  Original Report Authenticated By: Joretta Bachelor, M.D.   Dg Bone Survey Met  09/22/2011  *RADIOLOGY REPORT*  Clinical Data: Bone pain.  Right hip pain.  Clinical concern for multiple myeloma.  METASTATIC BONE SURVEY  Comparison: Previous related examinations.  Findings:  Lateral skull:  No lytic lesions.  AP and lateral cervical spine:  Multilevel degenerative changes. Mild scoliosis.  Mild reversal of the normal cervical lordosis.  No lytic lesions.  AP and lateral thoracic spine:  Multilevel degenerative changes, including changes of DISH.  No lytic lesions.  AP and lateral lumbar spine:  Mild levoconvex rotary scoliosis. Multilevel degenerative changes.  No lytic lesions.  AP pelvis:  Absence of the proximal right femur with extensive soft tissue calcific density suggesting methylmethacrylate.  No lytic lesions.  AP femurs:  Absence of the proximal right femur with  screw holes in the remaining midportion of the right femur.  Extensive calcific density in the soft tissues compatible with methylmethacrylate. Unremarkable proximal left femur.  No lytic lesions.  AP humeri:  Normal, without lytic lesions.  AP shoulders:  Mild bilateral acromioclavicular spur formation.  No lytic lesions.  IMPRESSION: No lytic lesions or other findings suspicious for multiple myeloma.  Original Report Authenticated By: Gerald Stabs, M.D.    Medications: Scheduled Meds:    . amLODipine  5 mg Oral Daily  . cefTAZidime (FORTAZ)  IV  2 g Intravenous Q48H  . darbepoetin (ARANESP) injection - NON-DIALYSIS  200 mcg Subcutaneous Q Thu-1800  . diltiazem  240 mg Oral Daily  . feeding supplement  1 Container Oral BID  . metoprolol tartrate  50 mg Oral BID  . sotalol  80 mg Oral Q48H   Continuous Infusions:    . sodium chloride 10 mL/hr at 09/26/11 0959     Assessment/Plan:  1. Acute on chronic respiratory failure, history of asthma-was on prednisone until 09/17/11. No current issues and stable  2. Encephalopathy-resolved, was secondary to possible renal insufficiency.issues.   3. 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 and antibiotic beads placed and 3/12-had washout 3/2 with non-purulence and seroma -ID consulted and have recommended 6 weeks total of antibiotics finishing 10/16/2011-will need to continue ceftaz time every 48 hourly with renal dosing.  - wound VAC removed today, followup with orthopedics in 2 weeks   4. Acute renal insufficiency on CKD. stage III, baseline creatinine 2.3:  Cause remains undefined, creatinine worse after hip surgery on 09/03/2011, likely Vancomycin/Tobramycin toxicity vs ATN, AIN - vein mapping done - right IJ hemodialysis catheter and left upper arm AV fistula placed today  - outpatient HD spot still in process   5. Hypercalcemia-calcium 9.6, but prior to dilaysis yesterday  was 12.3. Suspected from immobilization, myeloma workup negative (Oncology recommended doing a 24-hour urine protein electrophoresis which was essentially negative. Skeletal survey done negative for Myleoma) - per renal service.   6. ABLA/Iron deficiency-her baseline seems to be between 8 and 9, transfused this admission.  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 Leukocytosis-improving, afebrile   8. Atrial fibrillation-rate controlled  - on CCB, BB, not on coumadin.  9. Obstructive sleep apnea-continue CPAP  10. Hypertension-stable   DVT Prophylaxis: SCD's  Code Status: Full Code  Disposition: outpatient HD spot still not available per Dr Jonnie Finner (renal service), likely disposition early next week.  LOS: 40 days   Betul Brisky M.D. [facility] 09/28/2011, 3:25 PM Pager: [phone]

## 2011-09-28 NOTE — Anesthesia Preprocedure Evaluation (Addendum)
Anesthesia Evaluation  Patient identified by MRN, date of birth, ID band Patient awake    Reviewed: Allergy & Precautions, H&P , NPO status , Patient's Chart, lab work & pertinent test results  Airway Mallampati: II TM Distance: >3 FB   Mouth opening: Limited Mouth Opening  Dental  (+) Teeth Intact   Pulmonary asthma , sleep apnea and Continuous Positive Airway Pressure Ventilation , pneumonia ,  breath sounds clear to auscultation  Pulmonary exam normal       Cardiovascular hypertension, Pt. on medications + dysrhythmias Atrial Fibrillation Rhythm:Regular     Neuro/Psych PSYCHIATRIC DISORDERS Depression CVA, Residual Symptoms    GI/Hepatic negative GI ROS, Neg liver ROS,   Endo/Other  negative endocrine ROS  Renal/GU CRF and DialysisRenal disease     Musculoskeletal   Abdominal   Peds  Hematology negative hematology ROS (+)   Anesthesia Other Findings   Reproductive/Obstetrics                           Anesthesia Physical Anesthesia Plan  ASA: III  Anesthesia Plan: MAC and General   Post-op Pain Management:    Induction:   Airway Management Planned: LMA and Simple Face Mask  Additional Equipment:   Intra-op Plan:   Post-operative Plan: Extubation in OR  Informed Consent: I have reviewed the patients History and Physical, chart, labs and discussed the procedure including the risks, benefits and alternatives for the proposed anesthesia with the patient or authorized representative who has indicated his/her understanding and acceptance.   Dental advisory given  Plan Discussed with: CRNA, Anesthesiologist and Surgeon  Anesthesia Plan Comments:         Anesthesia Quick Evaluation

## 2011-09-28 NOTE — OR Nursing (Signed)
Second Procedure is Creation of Left Arteriovenous Fistula.  Dr. Donnetta Hutching in room at [phone].  Time out at 0842.  Procedure start at [phone].

## 2011-09-28 NOTE — Progress Notes (Signed)
Pt off unit

## 2011-09-28 NOTE — Progress Notes (Signed)
Pt does not want to wear CPAP tonight; complaints of neck hurting. RN aware. RT will continue to monitor.

## 2011-09-29 MED ORDER — HEPARIN SODIUM (PORCINE) 1000 UNIT/ML DIALYSIS
20.0000 [IU]/kg | INTRAMUSCULAR | Status: DC | PRN
  Administered 2011-10-01: 2100 [IU] via INTRAVENOUS_CENTRAL

## 2011-09-29 NOTE — Progress Notes (Signed)
Pt dressing was changed to right hip per order.

## 2011-09-29 NOTE — Progress Notes (Signed)
VASCULAR AND VEIN SURGERY PROGRESS NOTE  Progress note  Date of Surgery: 09/10/2011 - 09/28/2011 Surgeon: Juliann Mule): Rosetta Posner, MD 1 Day Post-Op Left Procedure(s): ARTERIOVENOUS (AV) FISTULA CREATION INSERTION OF DIALYSIS CATHETER   HPI: [name] is a 66 y.o. female ESRD   Significant Diagnostic Studies: CBC    Component Value Date/Time   WBC 10.2 09/28/2011 0600   RBC 3.32* 09/28/2011 0600   HGB 9.5* 09/28/2011 0600   HCT 29.0* 09/28/2011 0600   PLT 195 09/28/2011 0600   MCV 87.3 09/28/2011 0600   MCH 28.6 09/28/2011 0600   MCHC 32.8 09/28/2011 0600   RDW 17.4* 09/28/2011 0600   LYMPHSABS 2.7 09/19/2011 1830   MONOABS 2.4* 09/19/2011 1830   EOSABS 0.3 09/19/2011 1830   BASOSABS 0.1 09/19/2011 1830    BMET    Component Value Date/Time   NA 139 09/28/2011 0600   K 3.5 09/28/2011 0600   CL 103 09/28/2011 0600   CO2 26 09/28/2011 0600   GLUCOSE 86 09/28/2011 0600   BUN 22 09/28/2011 0600   CREATININE 5.56* 09/28/2011 0600   CALCIUM 10.8* 09/28/2011 0600   CALCIUM 11.1* 09/11/2011 0825   GFRNONAA 7* 09/28/2011 0600   GFRAA 9* 09/28/2011 0600    COAG Lab Results  Component Value Date   INR 1.13 09/15/2011   INR 1.09 09/14/2011   INR 1.61* 09/11/2011   No results found for this basename: PTT     I/O last 3 completed shifts: In: 740 [P.O.:240; I.V.:500] Out: 3600 [Urine:1050; Other:2500; Blood:50]  Physical Examination  Patient Vitals for the past 24 hrs:  BP Temp Temp src Pulse Resp SpO2 Weight  09/29/11 0540 132/59 mmHg 97.6 F (36.4 C) Oral 69  20  99 % -  09/28/11 2252 133/59 mmHg 97.4 F (36.3 C) Oral 87  20  98 % 104.7 kg (230 lb 13.2 oz)  09/28/11 2146 151/83 mmHg 98.2 F (36.8 C) Oral 91  20  95 % 104.7 kg (230 lb 13.2 oz)  09/28/11 2134 138/81 mmHg - - 91  - - -  09/28/11 2100 151/84 mmHg - - 87  - - -  09/28/11 2030 141/87 mmHg - - 88  - - -  09/28/11 2010 163/74 mmHg - - 83  - - -  09/28/11 1930 161/81 mmHg - - 85  - - -  09/28/11 1900 172/84 mmHg - - 85   - - -  09/28/11 1830 178/89 mmHg - - 84  - - -  09/28/11 1757 176/93 mmHg - - 79  - - -  09/28/11 1747 178/83 mmHg 97.5 F (36.4 C) Oral 79  20  95 % 107.4 kg (236 lb 12.4 oz)  09/28/11 1638 180/70 mmHg 97.6 F (36.4 C) Oral 80  19  100 % -  09/28/11 1347 175/75 mmHg 97.4 F (36.3 C) Oral 79  17  96 % -  09/28/11 1132 169/78 mmHg 97.3 F (36.3 C) Oral 77  18  - -  09/28/11 1100 - 98 F (36.7 C) - - - - -  09/28/11 0947 - - - - - 0 % -  09/28/11 0945 - 97.9 F (36.6 C) - - - - -    Pt Incision is clean, dry, intact or healing well, skin color is normal, no cyanosis, jaundice, pallor or bruising, normal palp thrill and distally left upper extremity N/V/M intact no signs of steel.  Assessment/Plan  [name] is a 66 y.o. year old  female who is S/P Procedure(s):  ARTERIOVENOUS (AV) FISTULA CREATION  INSERTION OF DIALYSIS CATHETER F/U in office in 4 weeks with Dr. Tonie Griffith, [facility] 09/29/2011 8:12 AM

## 2011-09-29 NOTE — Progress Notes (Signed)
Patient ID: [name], female   DOB: Aug 29, 1945, 66 y.o.   MRN: [id number] Had successful hemodialysis via her right hemodialysis catheter yesterday. Her left antecubital incision looks good. She does have a thrill and bruit present in her upper arm cephalic vein fistula. She has no evidence of steal. I will see her in the office in one month for followup. Please call if we can assist

## 2011-09-29 NOTE — Progress Notes (Signed)
Patient is refusing CPAP therapy tonight.   States that she will wear tomorrow night.

## 2011-09-29 NOTE — Progress Notes (Signed)
Patient ID: AEJA GARNEY  female  [id number]    DOB: 08/22/1945    DOA: 09/10/2011  PCP: Garnet Koyanagi, DO, DO  Subjective: No complaints, right IJ hemodialysis catheter placed, AV fistula in left upper arm yesterday, HD last night  Objective: Weight change:   Intake/Output Summary (Last 24 hours) at 09/29/11 1430 Last data filed at 09/28/11 2146  Gross per 24 hour  Intake    120 ml  Output   2500 ml  Net  -2380 ml   Blood pressure 129/82, pulse 82, temperature 98.4 F (36.9 C), temperature source Oral, resp. rate 20, height 5\' 5"  (1.651 m), weight 104.7 kg (230 lb 13.2 oz), SpO2 99.00%.  Physical Exam: General: Alert and awake, oriented x3, not in any acute distress. HEENT: anicteric sclera, pupils reactive to light and accommodation, EOMI CVS: S1-S2 clear, no murmur rubs or gallops Chest: clear to auscultation bilaterally, no wheezing, rales or rhonchi Abdomen: soft nontender, nondistended, normal bowel sounds, no organomegaly Extremities: no cyanosis, clubbing or edema noted bilaterally, drain/wound VAC in right lower extremity Neuro: Cranial nerves II-XII intact, no focal neurological deficits  Lab Results: Basic Metabolic Panel:  Lab 0000000 0600 09/26/11 0600  NA 139 139  K 3.5 3.4*  CL 103 103  CO2 26 25  GLUCOSE 86 85  BUN 22 13  CREATININE 5.56* 3.90*  CALCIUM 10.8* 10.7*  MG -- --  PHOS 4.2 --   Liver Function Tests:  Lab 09/28/11 0600 09/26/11 0600  AST -- --  ALT -- --  ALKPHOS -- --  BILITOT -- --  PROT -- --  ALBUMIN 2.2* 2.3*   CBC:  Lab 09/28/11 0600 09/24/11 0930  WBC 10.2 11.6*  NEUTROABS -- --  HGB 9.5* 9.5*  HCT 29.0* 28.6*  MCV 87.3 86.7  PLT 195 244  CBG: No results found for this basename: GLUCAP:5 in the last 168 hours   Micro Results:  Studies/Results: Dg Chest 1 View  09/10/2011  *RADIOLOGY REPORT*  Clinical Data: Altered mental status.  CHEST - 1 VIEW  Comparison: 09/04/2011  Findings: The patient has a right-sided  PICC line, tip to the superior vena cava.  The heart is enlarged.  There is dense opacity at the left lung base which partially obscures the lateral hemidiaphragm.  Findings are consistent with infectious infiltrate. No evidence for pulmonary edema.  No evidence for pneumothorax. There is degenerative change in the spine.  IMPRESSION:  1.  Cardiomegaly without pulmonary edema. 2.  Left lower lobe infiltrate.  Original Report Authenticated By: Glenice Bow, M.D.   Ct Head Wo Contrast  09/12/2011  *RADIOLOGY REPORT*  Clinical Data: 66 year old female with decreased level of consciousness.  CT HEAD WITHOUT CONTRAST  Technique:  Contiguous axial images were obtained from the base of the skull through the vertex without contrast.  Comparison: 09/10/2011 and earlier.  Findings: Mild bubbly opacity in the right sphenoid.  Other Visualized paranasal sinuses and mastoids are clear.  The patient is intubated.  No acute scalp or orbit soft tissue findings. No acute osseous abnormality identified.  Stable cerebral volume.  No ventriculomegaly. Stable gray-white matter differentiation throughout the brain.  No midline shift, mass effect, or evidence of mass lesion.  No acute intracranial hemorrhage identified.  No evidence of cortically based acute infarction identified.  No suspicious intracranial vascular hyperdensity.  IMPRESSION: 1.  Stable noncontrast CT appearance the brain without acute intracranial abnormality. 2.  Mild paranasal sinus inflammatory changes in the setting of intubation.  Original Report Authenticated By: Randall An, M.D.   Ct Head Wo Contrast  09/10/2011  *RADIOLOGY REPORT*  Clinical Data: Confusion, weakness, previous stroke.  CT HEAD WITHOUT CONTRAST  Technique:  Contiguous axial images were obtained from the base of the skull through the vertex without contrast.  Comparison: 09/23/2008  Findings: The previously noted right thalamic hemorrhage has resolved. Atherosclerotic and  physiologic intracranial calcifications.  Mild atrophy . There is no evidence of acute intracranial hemorrhage, brain edema, mass lesion, acute infarction,   mass effect, or midline shift. Acute infarct may be inapparent on noncontrast CT.  No other intra-axial abnormalities are seen, and the ventricles and sulci are within normal limits in size and symmetry.   No abnormal extra-axial fluid collections or masses are identified.  No significant calvarial abnormality.  IMPRESSION: 1. Negative for bleed or other acute intracranial process.  Original Report Authenticated By: Trecia Rogers, M.D.   US Abdomen Complete  09/11/2011  *RADIOLOGY REPORT*  Clinical Data:  Elevated INR, liver disease, morbid obesity, prior cholecystectomy  ULTRASOUND ABDOMEN:  Technique:  Sonography of upper abdominal structures was performed. Examination limited by body habitus and bowel gas.  Comparison:  Renal ultrasound 09/03/2006  Gallbladder:  Surgically absent  Common bile duct:  8 mm diameter, may be normal post cholecystectomy  Liver:  Normal appearance  IVC:  Normal appearance  Pancreas:  Suboptimally visualized due to bowel gas, with portions of head and tail obscured.  Visualized portions normal appearance.  Spleen:  Normal appearance, 5.9 cm length  Right kidney:  10.8 cm length.  Increased cortical echogenicity. Small cyst 1.1 x 1.2 x 1.0 cm.  No gross hydronephrosis or shadowing calcification.  Left kidney:  Suboptimally visualized.  Approximately 10.7 cm length.  Marked cortical thinning.  Increased cortical echogenicity.  No gross mass or hydronephrosis seen on limited assessment.  Aorta:  Proximally normal caliber, remainder obscured by bowel gas.  Other:  No free fluid  IMPRESSION: Medical renal disease changes of both kidneys with cortical atrophy of left kidney. Post cholecystectomy with slightly dilated common bile duct 8 mm diameter, which may be physiologic; recommend correlation with LFTs. Incomplete  visualization of pancreas, aorta and left kidney.  Original Report Authenticated By: Burnetta Sabin, M.D.   Dg Chest Port 1 View  09/16/2011  *RADIOLOGY REPORT*  Clinical Data: Respiratory failure  PORTABLE CHEST - 1 VIEW  Comparison:   the previous day's study  Findings: Right IJ central line and left to right arm PICC remain in place.  The patient has been extubated and the nasogastric tube removed.  Stable cardiomegaly.  Cannot exclude small left pleural effusion.  Slightly improved aeration with concomitant decrease in perihilar and bibasilar atelectasis or interstitial infiltrates.  IMPRESSION:  1.  Extubation with slightly improved aeration.  Original Report Authenticated By: Trecia Rogers, M.D.   Dg Chest Port 1 View  09/15/2011  *RADIOLOGY REPORT*  Clinical Data: Intubated  PORTABLE CHEST - 1 VIEW  Comparison:   the previous day's study  Findings: Endotracheal tube, nasogastric tube, and right IJ central line are stable in position.  Stable cardiomegaly.  Relatively low lung volumes with persistent perihilar and bibasilar atelectasis or interstitial infiltrates.  Cannot exclude small left pleural effusion.  IMPRESSION:  1.  Little change from previous day's portable exam.  Original Report Authenticated By: Trecia Rogers, M.D.   Dg Chest Port 1 View  09/14/2011  *RADIOLOGY REPORT*  Clinical Data: Follow up of  infiltrate.  Shortness of breath. Morbid obesity.  PORTABLE CHEST - 1 VIEW  Comparison: 1 day prior  Findings: Endotracheal tube is unchanged, with tip at the level of the clavicles.  Nasogastric tube extends beyond the inferior aspect of the film.  Right IJ central line unchanged.  Mild cardiomegaly.  No right pleural effusion.  Cannot exclude small left pleural effusion. No pneumothorax.  Low lung volumes. Interval development of mild pulmonary venous congestion.  Mild bibasilar volume loss.  IMPRESSION: Cardiomegaly and low lung volumes with mild pulmonary venous congestion.   Similar patchy bibasilar atelectasis.  Possible small left pleural effusion.  Original Report Authenticated By: Areta Haber, M.D.   Dg Chest Port 1 View  09/13/2011  *RADIOLOGY REPORT*  Clinical Data: Endotracheal tube.  PORTABLE CHEST - 1 VIEW  Comparison: 09/12/2011.  Findings: Endotracheal tube is in satisfactory position. Nasogastric tube is followed into the stomach, with the tip projecting beyond the inferior boundary of the film.  Right IJ central line and right PICC tips project over the SVC.  Heart is at the upper limits of normal in size, stable.  Left perihilar and right infrahilar air space disease persists.  Question left pleural effusion.  IMPRESSION: Left perihilar and right basilar airspace disease persist. Probable left pleural effusion.  Original Report Authenticated By: Luretha Rued, M.D.   Dg Chest Port 1 View  09/12/2011  *RADIOLOGY REPORT*  Clinical Data: Respiratory failure and status post intubation.  PORTABLE CHEST - 1 VIEW  Comparison: Film at 1030 hours.  Findings: Endotracheal tube present with the tip approximately 2.5 cm above the carina.  Stable positioning of temporary dialysis catheter with the tip in the lower SVC.  Additional right-sided PICC line tip continues to lie at the level of the proximal SVC. Nasogastric tube continues to extend into the stomach.  Lungs show stable cardiomegaly and left lower lobe atelectasis.  No overt edema present.  No evidence of pneumothorax.  No significant pleural fluid.  IMPRESSION: Endotracheal tube appropriately positioned with tip 2.5 cm above the carina.  Otherwise no significant change in appearance of the chest.  Original Report Authenticated By: Azzie Roup, M.D.   Dg Chest Port 1 View  09/12/2011  *RADIOLOGY REPORT*  Clinical Data: Line placement.  PORTABLE CHEST - 1 VIEW  Comparison: 09/12/2011  Findings: Right central line placement.  The tip is in the SVC.  No pneumothorax.  Right PICC line is unchanged.   Cardiomegaly.  Mild vascular congestion. No effusions.  Probable left basilar opacity, unchanged.  No overt edema.  IMPRESSION: Right central line tip in the SVC.  No pneumothorax.  Otherwise no change.  Original Report Authenticated By: Raelyn Number, M.D.   Dg Chest Port 1 View  09/12/2011  *RADIOLOGY REPORT*  Clinical Data: Edema, renal failure  PORTABLE CHEST - 1 VIEW  Comparison: 09/10/2011  Findings: Cardiomegaly again noted.  No pulmonary edema.  Stable right arm PICC line position with tip in upper SVC.  Stable left basilar atelectasis or infiltrate.  IMPRESSION: No pulmonary edema.  Again noted left basilar atelectasis or infiltrate.  Original Report Authenticated By: Lahoma Crocker, M.D.   Dg Chest Port 1 View  09/04/2011  *RADIOLOGY REPORT*  Clinical Data: For central line placement  PORTABLE CHEST - 1 VIEW  Comparison: Portable chest x-ray of 11/21 for a 1012  Findings: The right upper extremity PICC line tip is in the mid lower SVC.  No pneumothorax is seen.  Cardiomegaly is stable.  A lap band is present.  IMPRESSION: Right PICC line tip in mid lower SVC.  No pneumothorax.  Original Report Authenticated By: Joretta Bachelor, M.D.   Dg Bone Survey Met  09/22/2011  *RADIOLOGY REPORT*  Clinical Data: Bone pain.  Right hip pain.  Clinical concern for multiple myeloma.  METASTATIC BONE SURVEY  Comparison: Previous related examinations.  Findings:  Lateral skull:  No lytic lesions.  AP and lateral cervical spine:  Multilevel degenerative changes. Mild scoliosis.  Mild reversal of the normal cervical lordosis.  No lytic lesions.  AP and lateral thoracic spine:  Multilevel degenerative changes, including changes of DISH.  No lytic lesions.  AP and lateral lumbar spine:  Mild levoconvex rotary scoliosis. Multilevel degenerative changes.  No lytic lesions.  AP pelvis:  Absence of the proximal right femur with extensive soft tissue calcific density suggesting methylmethacrylate.  No lytic lesions.  AP femurs:   Absence of the proximal right femur with screw holes in the remaining midportion of the right femur.  Extensive calcific density in the soft tissues compatible with methylmethacrylate. Unremarkable proximal left femur.  No lytic lesions.  AP humeri:  Normal, without lytic lesions.  AP shoulders:  Mild bilateral acromioclavicular spur formation.  No lytic lesions.  IMPRESSION: No lytic lesions or other findings suspicious for multiple myeloma.  Original Report Authenticated By: Gerald Stabs, M.D.    Medications: Scheduled Meds:    . amLODipine  5 mg Oral Daily  . cefTAZidime (FORTAZ)  IV  2 g Intravenous Q48H  . darbepoetin      . darbepoetin (ARANESP) injection - NON-DIALYSIS  200 mcg Subcutaneous Q Thu-1800  . diltiazem  240 mg Oral Daily  . feeding supplement  1 Container Oral BID  . metoprolol tartrate  50 mg Oral BID  . sotalol  80 mg Oral Q48H   Continuous Infusions:    . sodium chloride 10 mL/hr at 09/26/11 0959     Assessment/Plan:  1. Acute on chronic respiratory failure, history of asthma-was on prednisone until 09/17/11. No current issues and stable  2. Encephalopathy-resolved, was secondary to possible renal insufficiency issues.   3. 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 and antibiotic beads placed and 3/12-had washout 3/2 with non-purulence and seroma - ID consulted and have recommended 6 weeks total of antibiotics finishing 10/16/2011-will need to continue ceftaz time every 48 hourly with renal dosing.  - wound VAC removed, followup with orthopedics in 2 weeks   4. Acute renal insufficiency on CKD. stage III, baseline creatinine 2.3:  Cause remains undefined, creatinine worse after hip surgery on 09/03/2011, likely Vancomycin/Tobramycin toxicity vs ATN, AIN - vein mapping done - right IJ hemodialysis catheter and left upper arm AV fistula placed, VVS to followup in office in one month  - outpatient HD  spot still in process   5. Hypercalcemia-calcium 9.6, but prior to dilaysis yesterday was 12.3. Suspected from immobilization, myeloma workup negative (Oncology recommended doing a 24-hour urine protein electrophoresis which was essentially negative. Skeletal survey done negative for Myleoma) - per renal service.   6. ABLA/Iron deficiency-her baseline seems to be between 8 and 9, transfused this admission.  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 Leukocytosis-improving, afebrile   8. Atrial fibrillation-rate controlled  - on CCB, BB, not on coumadin.  9. Obstructive sleep apnea-continue CPAP  10. Hypertension-stable   DVT Prophylaxis: SCD's  Code Status: Full Code  Disposition: outpatient  HD spot still not available likely disposition early next week.   LOS: 49 days   Lesle Faron M.D. [facility] 09/29/2011, 2:30 PM Pager: [phone]

## 2011-09-29 NOTE — Progress Notes (Signed)
Patient ID: [name], female   DOB: [birth year], 66 y.o.   MRN: [id number]  S: No complaints.   Feels good, had AVF and IJ Cath placed yest, HD last night  O:BP 132/59  Pulse 69  Temp(Src) 97.6 F (36.4 C) (Oral)  Resp 20  Ht 5\' 5"  (1.651 m)  Wt 104.7 kg (230 lb 13.2 oz)  BMI 38.41 kg/m2  SpO2 99%  Intake/Output Summary (Last 24 hours) at 09/29/11 0919 Last data filed at 09/28/11 2146  Gross per 24 hour  Intake    740 ml  Output   2925 ml  Net  -2185 ml   Weight change:  Gen: sitting up with no assist at side of bed, no distress [id number] regular in rate and rhythm, heart sounds S1 and S2 normal  Resp:Diminished breath sounds bibasally, no rales/rhonchi  [id number], obese, nontender and bowel sounds are normal  Ext: trace to 1+ edema LE's, new AVF LUA +bruit, R IJ TDC in place      . amLODipine  5 mg Oral Daily  . cefTAZidime (FORTAZ)  IV  2 g Intravenous Q48H  . darbepoetin      . darbepoetin (ARANESP) injection - NON-DIALYSIS  200 mcg Subcutaneous Q Thu-1800  . diltiazem  240 mg Oral Daily  . feeding supplement  1 Container Oral BID  . metoprolol tartrate  50 mg Oral BID  . sotalol  80 mg Oral Q48H   Dg Chest Port 1 View  09/28/2011  *RADIOLOGY REPORT*  Clinical Data: Dialysis catheter placement.  PORTABLE CHEST - 1 VIEW  Comparison: 09/16/2011  Findings: Right dialysis catheter has been placed.  The tips are in the upper right atrium.  No pneumothorax.  Right PICC line is unchanged.  No pneumothorax.  Cardiomegaly.  Lungs are clear.  IMPRESSION: Right dialysis catheter tip in the upper right atrium.  No pneumothorax.  Otherwise no change.  Original Report Authenticated By: Raelyn Number, M.D.   Dg Fluoro Guide Cv Line-no Report  09/28/2011  CLINICAL DATA: Dialysis Catheter   FLOURO GUIDE CV LINE  Fluoroscopy was utilized by the requesting physician.  No radiographic  interpretation.     BMET  Lab 09/28/11 0600 09/26/11 0600 09/25/11 0515 09/24/11 0930 09/23/11  0500  NA 139 139 138 139 139  K 3.5 3.4* 3.4* 3.3* 3.1*  CL 103 103 102 101 102  CO2 26 25 28 24 26   GLUCOSE 86 85 84 89 86  BUN 22 13 10  27* 22  CREATININE 5.56* 3.90* 2.87* 5.44* 4.29*  ALB -- -- -- -- --  CALCIUM 10.8* 10.7* 9.6 12.3* 11.2*  PHOS 4.2 2.7 2.0* 3.5 3.0   CBC  Lab 09/28/11 0600 09/24/11 0930 09/23/11 0500  WBC 10.2 11.6* 12.5*  NEUTROABS -- -- --  HGB 9.5* 9.5* 8.9*  HCT 29.0* 28.6* 26.7*  MCV 87.3 86.7 87.3  PLT 195 244 205     Assessment/Plan:  1. Acute on CKD, new ESRD- stable, had HD yest. Next HD Monday. CLIP process started for outpt HD slot, hopefully can be resolved on Monday.  Going to SNF near [facility] [employer].  2. Hypercalcemia- suspected likely from immobilization. Myeloma workup negative, PTH low at 6.7.  S/P calcitonin. Ca+ increasing, will dialyze on 2.0 Ca++ bath.  3. 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 Alvan Dame with sterile seroma; wound VAC removed yesterday.   4. HTN- stable on norvasc, diltiazem  and metoprolol 5. Anemia- ABLA and acute illness with CKD at baseline, transfuse as needed; Aranesp started; iron OK 6. Gout-  on allopurinol at reduced dose  7. Resolved PNA 8. Volume- improved 9. PICC line- will have this removed after HD cath placed tomorrow    Kelly Splinter  MD [facility] [phone] pgr    [phone] cell 09/29/2011, 9:19 AM

## 2011-09-30 NOTE — Progress Notes (Signed)
Patient asks if her PICC line can be discontinued tomorrow instead of today. Also, after speaking with IV nurse, she recommends waiting til patient discharges if in the event line is needed. Spoke with Dr. Jonnie Finner and he stated it is okay to wait until tomorrow to discontinue PICC line.

## 2011-09-30 NOTE — Progress Notes (Signed)
Patient ID: [name]  female  [id number]    DOB: Dec 05, 1945    DOA: 09/10/2011  PCP: Garnet Koyanagi, DO, DO  Subjective: No complaints, right IJ hemodialysis catheter placed, AV fistula in left upper arm  Objective: Weight change:   Intake/Output Summary (Last 24 hours) at 09/30/11 1245 Last data filed at 09/30/11 0900  Gross per 24 hour  Intake   1090 ml  Output     75 ml  Net   1015 ml   Blood pressure 152/75, pulse 69, temperature 97 F (36.1 C), temperature source Oral, resp. rate 18, height 5\' 5"  (1.651 m), weight 104.7 kg (230 lb 13.2 oz), SpO2 99.00%.  Physical Exam: General: Alert and awake, oriented x3, not in any acute distress. HEENT: anicteric sclera, pupils reactive to light and accommodation, EOMI CVS: S1-S2 clear, no murmur rubs or gallops Chest: clear to auscultation bilaterally, no wheezing, rales or rhonchi Abdomen: soft nontender, nondistended, normal bowel sounds, no organomegaly Extremities: no cyanosis, clubbing or edema noted bilaterally,  Neuro: Cranial nerves II-XII intact, no focal neurological deficits  Lab Results: Basic Metabolic Panel:  Lab 0000000 0600 09/26/11 0600  NA 139 139  K 3.5 3.4*  CL 103 103  CO2 26 25  GLUCOSE 86 85  BUN 22 13  CREATININE 5.56* 3.90*  CALCIUM 10.8* 10.7*  MG -- --  PHOS 4.2 --   Liver Function Tests:  Lab 09/28/11 0600 09/26/11 0600  AST -- --  ALT -- --  ALKPHOS -- --  BILITOT -- --  PROT -- --  ALBUMIN 2.2* 2.3*   CBC:  Lab 09/28/11 0600 09/24/11 0930  WBC 10.2 11.6*  NEUTROABS -- --  HGB 9.5* 9.5*  HCT 29.0* 28.6*  MCV 87.3 86.7  PLT 195 244  CBG: No results found for this basename: GLUCAP:5 in the last 168 hours   Micro Results:  Studies/Results: Dg Chest 1 View  09/10/2011  *RADIOLOGY REPORT*  Clinical Data: Altered mental status.  CHEST - 1 VIEW  Comparison: 09/04/2011  Findings: The patient has a right-sided PICC line, tip to the superior vena cava.  The heart is enlarged.   There is dense opacity at the left lung base which partially obscures the lateral hemidiaphragm.  Findings are consistent with infectious infiltrate. No evidence for pulmonary edema.  No evidence for pneumothorax. There is degenerative change in the spine.  IMPRESSION:  1.  Cardiomegaly without pulmonary edema. 2.  Left lower lobe infiltrate.  Original Report Authenticated By: Glenice Bow, M.D.   Ct Head Wo Contrast  09/12/2011  *RADIOLOGY REPORT*  Clinical Data: 66 year old female with decreased level of consciousness.  CT HEAD WITHOUT CONTRAST  Technique:  Contiguous axial images were obtained from the base of the skull through the vertex without contrast.  Comparison: 09/10/2011 and earlier.  Findings: Mild bubbly opacity in the right sphenoid.  Other Visualized paranasal sinuses and mastoids are clear.  The patient is intubated.  No acute scalp or orbit soft tissue findings. No acute osseous abnormality identified.  Stable cerebral volume.  No ventriculomegaly. Stable gray-white matter differentiation throughout the brain.  No midline shift, mass effect, or evidence of mass lesion.  No acute intracranial hemorrhage identified.  No evidence of cortically based acute infarction identified.  No suspicious intracranial vascular hyperdensity.  IMPRESSION: 1.  Stable noncontrast CT appearance the brain without acute intracranial abnormality. 2.  Mild paranasal sinus inflammatory changes in the setting of intubation.  Original Report Authenticated By: Joni Fears  III, M.D.   Ct Head Wo Contrast  09/10/2011  *RADIOLOGY REPORT*  Clinical Data: Confusion, weakness, previous stroke.  CT HEAD WITHOUT CONTRAST  Technique:  Contiguous axial images were obtained from the base of the skull through the vertex without contrast.  Comparison: 09/23/2008  Findings: The previously noted right thalamic hemorrhage has resolved. Atherosclerotic and physiologic intracranial calcifications.  Mild atrophy . There is no evidence  of acute intracranial hemorrhage, brain edema, mass lesion, acute infarction,   mass effect, or midline shift. Acute infarct may be inapparent on noncontrast CT.  No other intra-axial abnormalities are seen, and the ventricles and sulci are within normal limits in size and symmetry.   No abnormal extra-axial fluid collections or masses are identified.  No significant calvarial abnormality.  IMPRESSION: 1. Negative for bleed or other acute intracranial process.  Original Report Authenticated By: Trecia Rogers, M.D.   US Abdomen Complete  09/11/2011  *RADIOLOGY REPORT*  Clinical Data:  Elevated INR, liver disease, morbid obesity, prior cholecystectomy  ULTRASOUND ABDOMEN:  Technique:  Sonography of upper abdominal structures was performed. Examination limited by body habitus and bowel gas.  Comparison:  Renal ultrasound 09/03/2006  Gallbladder:  Surgically absent  Common bile duct:  8 mm diameter, may be normal post cholecystectomy  Liver:  Normal appearance  IVC:  Normal appearance  Pancreas:  Suboptimally visualized due to bowel gas, with portions of head and tail obscured.  Visualized portions normal appearance.  Spleen:  Normal appearance, 5.9 cm length  Right kidney:  10.8 cm length.  Increased cortical echogenicity. Small cyst 1.1 x 1.2 x 1.0 cm.  No gross hydronephrosis or shadowing calcification.  Left kidney:  Suboptimally visualized.  Approximately 10.7 cm length.  Marked cortical thinning.  Increased cortical echogenicity.  No gross mass or hydronephrosis seen on limited assessment.  Aorta:  Proximally normal caliber, remainder obscured by bowel gas.  Other:  No free fluid  IMPRESSION: Medical renal disease changes of both kidneys with cortical atrophy of left kidney. Post cholecystectomy with slightly dilated common bile duct 8 mm diameter, which may be physiologic; recommend correlation with LFTs. Incomplete visualization of pancreas, aorta and left kidney.  Original Report Authenticated By:  Burnetta Sabin, M.D.   Dg Chest Port 1 View  09/16/2011  *RADIOLOGY REPORT*  Clinical Data: Respiratory failure  PORTABLE CHEST - 1 VIEW  Comparison:   the previous day's study  Findings: Right IJ central line and left to right arm PICC remain in place.  The patient has been extubated and the nasogastric tube removed.  Stable cardiomegaly.  Cannot exclude small left pleural effusion.  Slightly improved aeration with concomitant decrease in perihilar and bibasilar atelectasis or interstitial infiltrates.  IMPRESSION:  1.  Extubation with slightly improved aeration.  Original Report Authenticated By: Trecia Rogers, M.D.   Dg Chest Port 1 View  09/15/2011  *RADIOLOGY REPORT*  Clinical Data: Intubated  PORTABLE CHEST - 1 VIEW  Comparison:   the previous day's study  Findings: Endotracheal tube, nasogastric tube, and right IJ central line are stable in position.  Stable cardiomegaly.  Relatively low lung volumes with persistent perihilar and bibasilar atelectasis or interstitial infiltrates.  Cannot exclude small left pleural effusion.  IMPRESSION:  1.  Little change from previous day's portable exam.  Original Report Authenticated By: Trecia Rogers, M.D.   Dg Chest Port 1 View  09/14/2011  *RADIOLOGY REPORT*  Clinical Data: Follow up of infiltrate.  Shortness of breath. Morbid  obesity.  PORTABLE CHEST - 1 VIEW  Comparison: 1 day prior  Findings: Endotracheal tube is unchanged, with tip at the level of the clavicles.  Nasogastric tube extends beyond the inferior aspect of the film.  Right IJ central line unchanged.  Mild cardiomegaly.  No right pleural effusion.  Cannot exclude small left pleural effusion. No pneumothorax.  Low lung volumes. Interval development of mild pulmonary venous congestion.  Mild bibasilar volume loss.  IMPRESSION: Cardiomegaly and low lung volumes with mild pulmonary venous congestion.  Similar patchy bibasilar atelectasis.  Possible small left pleural effusion.  Original  Report Authenticated By: Areta Haber, M.D.   Dg Chest Port 1 View  09/13/2011  *RADIOLOGY REPORT*  Clinical Data: Endotracheal tube.  PORTABLE CHEST - 1 VIEW  Comparison: 09/12/2011.  Findings: Endotracheal tube is in satisfactory position. Nasogastric tube is followed into the stomach, with the tip projecting beyond the inferior boundary of the film.  Right IJ central line and right PICC tips project over the SVC.  Heart is at the upper limits of normal in size, stable.  Left perihilar and right infrahilar air space disease persists.  Question left pleural effusion.  IMPRESSION: Left perihilar and right basilar airspace disease persist. Probable left pleural effusion.  Original Report Authenticated By: Luretha Rued, M.D.   Dg Chest Port 1 View  09/12/2011  *RADIOLOGY REPORT*  Clinical Data: Respiratory failure and status post intubation.  PORTABLE CHEST - 1 VIEW  Comparison: Film at 1030 hours.  Findings: Endotracheal tube present with the tip approximately 2.5 cm above the carina.  Stable positioning of temporary dialysis catheter with the tip in the lower SVC.  Additional right-sided PICC line tip continues to lie at the level of the proximal SVC. Nasogastric tube continues to extend into the stomach.  Lungs show stable cardiomegaly and left lower lobe atelectasis.  No overt edema present.  No evidence of pneumothorax.  No significant pleural fluid.  IMPRESSION: Endotracheal tube appropriately positioned with tip 2.5 cm above the carina.  Otherwise no significant change in appearance of the chest.  Original Report Authenticated By: Azzie Roup, M.D.   Dg Chest Port 1 View  09/12/2011  *RADIOLOGY REPORT*  Clinical Data: Line placement.  PORTABLE CHEST - 1 VIEW  Comparison: 09/12/2011  Findings: Right central line placement.  The tip is in the SVC.  No pneumothorax.  Right PICC line is unchanged.  Cardiomegaly.  Mild vascular congestion. No effusions.  Probable left basilar opacity,  unchanged.  No overt edema.  IMPRESSION: Right central line tip in the SVC.  No pneumothorax.  Otherwise no change.  Original Report Authenticated By: Raelyn Number, M.D.   Dg Chest Port 1 View  09/12/2011  *RADIOLOGY REPORT*  Clinical Data: Edema, renal failure  PORTABLE CHEST - 1 VIEW  Comparison: 09/10/2011  Findings: Cardiomegaly again noted.  No pulmonary edema.  Stable right arm PICC line position with tip in upper SVC.  Stable left basilar atelectasis or infiltrate.  IMPRESSION: No pulmonary edema.  Again noted left basilar atelectasis or infiltrate.  Original Report Authenticated By: Lahoma Crocker, M.D.   Dg Chest Port 1 View  09/04/2011  *RADIOLOGY REPORT*  Clinical Data: For central line placement  PORTABLE CHEST - 1 VIEW  Comparison: Portable chest x-ray of 11/21 for a 1012  Findings: The right upper extremity PICC line tip is in the mid lower SVC.  No pneumothorax is seen.  Cardiomegaly is stable.  A lap band is present.  IMPRESSION: Right PICC line tip in mid lower SVC.  No pneumothorax.  Original Report Authenticated By: Joretta Bachelor, M.D.   Dg Bone Survey Met  09/22/2011  *RADIOLOGY REPORT*  Clinical Data: Bone pain.  Right hip pain.  Clinical concern for multiple myeloma.  METASTATIC BONE SURVEY  Comparison: Previous related examinations.  Findings:  Lateral skull:  No lytic lesions.  AP and lateral cervical spine:  Multilevel degenerative changes. Mild scoliosis.  Mild reversal of the normal cervical lordosis.  No lytic lesions.  AP and lateral thoracic spine:  Multilevel degenerative changes, including changes of DISH.  No lytic lesions.  AP and lateral lumbar spine:  Mild levoconvex rotary scoliosis. Multilevel degenerative changes.  No lytic lesions.  AP pelvis:  Absence of the proximal right femur with extensive soft tissue calcific density suggesting methylmethacrylate.  No lytic lesions.  AP femurs:  Absence of the proximal right femur with screw holes in the remaining midportion of the  right femur.  Extensive calcific density in the soft tissues compatible with methylmethacrylate. Unremarkable proximal left femur.  No lytic lesions.  AP humeri:  Normal, without lytic lesions.  AP shoulders:  Mild bilateral acromioclavicular spur formation.  No lytic lesions.  IMPRESSION: No lytic lesions or other findings suspicious for multiple myeloma.  Original Report Authenticated By: Gerald Stabs, M.D.    Medications: Scheduled Meds:    . amLODipine  5 mg Oral Daily  . cefTAZidime (FORTAZ)  IV  2 g Intravenous Q48H  . darbepoetin (ARANESP) injection - NON-DIALYSIS  200 mcg Subcutaneous Q Thu-1800  . diltiazem  240 mg Oral Daily  . feeding supplement  1 Container Oral BID  . metoprolol tartrate  50 mg Oral BID  . sotalol  80 mg Oral Q48H   Continuous Infusions:    . sodium chloride 500 mL (09/29/11 2112)     Assessment/Plan:  1. Acute on chronic respiratory failure, history of asthma-was on prednisone until 09/17/11. No current issues and stable  2. Encephalopathy-resolved, was secondary to possible renal insufficiency issues.   3. 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 and antibiotic beads placed and 3/12-had washout 3/2 with non-purulence and seroma - ID consulted and have recommended 6 weeks total of antibiotics finishing 10/16/2011-will need to continue ceftaz time every 48 hourly with renal dosing.  - wound VAC removed on 09/28/11, followup with orthopedics in 2 weeks   4. Acute renal insufficiency on CKD. stage III, baseline creatinine 2.3:  Cause remains undefined, creatinine worse after hip surgery on 09/03/2011, likely Vancomycin/Tobramycin toxicity vs ATN, AIN - vein mapping done - right IJ hemodialysis catheter and left upper arm AV fistula placed, VVS to followup in office in one month  - outpatient HD spot still in process   5. Hypercalcemia-calcium 9.6, but prior to dilaysis yesterday was 12.3.  Suspected from immobilization, myeloma workup negative (Oncology recommended doing a 24-hour urine protein electrophoresis which was essentially negative. Skeletal survey done negative for Myleoma) - per renal service.   6. ABLA/Iron deficiency-her baseline seems to be between 8 and 9, transfused this admission.  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 Leukocytosis-improving, afebrile   8. Atrial fibrillation-rate controlled  - on CCB, BB, not on coumadin.  9. Obstructive sleep apnea-continue CPAP  10. Hypertension-stable   DVT Prophylaxis: SCD's  Code Status: Full Code  Disposition: outpatient HD spot still not available, likely disposition tomorrow, asked renal for hemodialysis  first shift.   LOS: 42 days   Shanon Seawright M.D. [facility] 09/30/2011, 12:45 PM Pager: [phone]

## 2011-09-30 NOTE — Progress Notes (Signed)
Patient ID: [name], female   DOB: 1946-05-04, 66 y.o.   MRN: [id number] Patient ID: [name], female   DOB: [birth year], 66 y.o.   MRN: [id number]  S: No complaints.   Feels good, had AVF and IJ Cath placed yest, HD last night  O:BP 153/76  Pulse 76  Temp(Src) 97.2 F (36.2 C) (Oral)  Resp 19  Ht 5\' 5"  (1.651 m)  Wt 104.7 kg (230 lb 13.2 oz)  BMI 38.41 kg/m2  SpO2 98%  Intake/Output Summary (Last 24 hours) at 09/30/11 1607 Last data filed at 09/30/11 1300  Gross per 24 hour  Intake   1090 ml  Output      0 ml  Net   1090 ml   Weight change:  Gen: sitting up with no assist at side of bed, no distress [id number] regular in rate and rhythm, heart sounds S1 and S2 normal  Resp:Diminished breath sounds bibasally, no rales/rhonchi  [id number], obese, nontender and bowel sounds are normal  Ext: trace to 1+ edema LE's, new AVF LUA +bruit, R IJ TDC in place      . amLODipine  5 mg Oral Daily  . cefTAZidime (FORTAZ)  IV  2 g Intravenous Q48H  . darbepoetin (ARANESP) injection - NON-DIALYSIS  200 mcg Subcutaneous Q Thu-1800  . diltiazem  240 mg Oral Daily  . feeding supplement  1 Container Oral BID  . metoprolol tartrate  50 mg Oral BID  . sotalol  80 mg Oral Q48H   No results found. BMET  Lab 09/28/11 0600 09/26/11 0600 09/25/11 0515 09/24/11 0930  NA 139 139 138 139  K 3.5 3.4* 3.4* 3.3*  CL 103 103 102 101  CO2 26 25 28 24   GLUCOSE 86 85 84 89  BUN 22 13 10  27*  CREATININE 5.56* 3.90* 2.87* 5.44*  ALB -- -- -- --  CALCIUM 10.8* 10.7* 9.6 12.3*  PHOS 4.2 2.7 2.0* 3.5   CBC  Lab 09/28/11 0600 09/24/11 0930  WBC 10.2 11.6*  NEUTROABS -- --  HGB 9.5* 9.5*  HCT 29.0* 28.6*  MCV 87.3 86.7  PLT 195 244     Assessment/Plan:  1. Acute on CKD, new ESRD- stable, had HD yest. Next HD Monday. CLIP process started for outpt HD slot, hopefully can be resolved on Monday.  Going to SNF near [employer].  2. Hypercalcemia- suspected likely from immobilization. Myeloma  workup negative, PTH low at 6.7.  S/P calcitonin. Ca+ increasing, will dialyze on 2.0 Ca++ bath.  3. 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 Alvan Dame with sterile seroma; wound VAC removed yesterday.   4. HTN- stable on norvasc, diltiazem and metoprolol 5. Anemia- ABLA and acute illness with CKD at baseline, transfuse as needed; Aranesp started; iron OK 6. Gout-  on allopurinol at reduced dose  7. Resolved PNA 8. Volume excess- resolved 9. PICC line- will d/c today, labs with HD tomorrow   Kelly Splinter  MD [facility] [phone] pgr    [phone] cell 09/30/2011, 4:07 PM

## 2011-09-30 NOTE — Progress Notes (Signed)
Patient ID: [name], female   DOB: [birth year], 66 y.o.   MRN: [id number] Patient ID: [name], female   DOB: May 23, 1946, 66 y.o.   MRN: [id number]  S: No complaints.   Feels good, had AVF and IJ Cath placed yest, HD last night  O:BP 153/76  Pulse 76  Temp(Src) 97.2 F (36.2 C) (Oral)  Resp 19  Ht 5\' 5"  (1.651 m)  Wt 104.7 kg (230 lb 13.2 oz)  BMI 38.41 kg/m2  SpO2 98%  Intake/Output Summary (Last 24 hours) at 09/30/11 1606 Last data filed at 09/30/11 1300  Gross per 24 hour  Intake   1090 ml  Output      0 ml  Net   1090 ml   Weight change:  Gen: sitting up with no assist at side of bed, no distress [id number] regular in rate and rhythm, heart sounds S1 and S2 normal  Resp:Diminished breath sounds bibasally, no rales/rhonchi  [id number], obese, nontender and bowel sounds are normal  Ext: trace to 1+ edema LE's, new AVF LUA +bruit, R IJ TDC in place      . amLODipine  5 mg Oral Daily  . cefTAZidime (FORTAZ)  IV  2 g Intravenous Q48H  . darbepoetin (ARANESP) injection - NON-DIALYSIS  200 mcg Subcutaneous Q Thu-1800  . diltiazem  240 mg Oral Daily  . feeding supplement  1 Container Oral BID  . metoprolol tartrate  50 mg Oral BID  . sotalol  80 mg Oral Q48H   No results found. BMET  Lab 09/28/11 0600 09/26/11 0600 09/25/11 0515 09/24/11 0930  NA 139 139 138 139  K 3.5 3.4* 3.4* 3.3*  CL 103 103 102 101  CO2 26 25 28 24   GLUCOSE 86 85 84 89  BUN 22 13 10  27*  CREATININE 5.56* 3.90* 2.87* 5.44*  ALB -- -- -- --  CALCIUM 10.8* 10.7* 9.6 12.3*  PHOS 4.2 2.7 2.0* 3.5   CBC  Lab 09/28/11 0600 09/24/11 0930  WBC 10.2 11.6*  NEUTROABS -- --  HGB 9.5* 9.5*  HCT 29.0* 28.6*  MCV 87.3 86.7  PLT 195 244     Assessment/Plan:  1. Acute on CKD, new ESRD- stable, next HD Monday. CLIP process started for outpt HD slot, hopefully can be resolved on Monday.  Ready for discharge otherwise, SNF has been obtained to my understanding (near [employer]). 2. Hypercalcemia- suspected likely from immobilization. Myeloma workup negative, PTH low at 6.7.  S/P calcitonin. Ca+ increasing, will dialyze on 2.0 Ca++ bath.  3. 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 Alvan Dame with sterile seroma; wound VAC removed now   4. HTN- stable on norvasc, diltiazem and metoprolol 5. Anemia- ABLA and acute illness with CKD at baseline, transfuse as needed; Aranesp started; iron OK 6. Gout-  on allopurinol at reduced dose  7. Resolved PNA 8. Volume- improved 9. PICC line- will have this removed after HD cath placed tomorrow    Kelly Splinter  MD [facility] [phone] pgr    [phone] cell 09/30/2011, 4:05 PM

## 2011-10-01 ENCOUNTER — Inpatient Hospital Stay (HOSPITAL_COMMUNITY): Payer: Medicare Other

## 2011-10-01 LAB — CBC
HCT: 30.7 % — ABNORMAL LOW (ref 36.0–46.0)
Hemoglobin: 9.7 g/dL — ABNORMAL LOW (ref 12.0–15.0)
MCH: 27.9 pg (ref 26.0–34.0)
MCHC: 31.6 g/dL (ref 30.0–36.0)
MCV: 88.2 fL (ref 78.0–100.0)
Platelets: 156 10*3/uL (ref 150–400)
RBC: 3.48 MIL/uL — ABNORMAL LOW (ref 3.87–5.11)
RDW: 17.1 % — ABNORMAL HIGH (ref 11.5–15.5)
WBC: 9.6 10*3/uL (ref 4.0–10.5)

## 2011-10-01 LAB — RENAL FUNCTION PANEL
Albumin: 2.2 g/dL — ABNORMAL LOW (ref 3.5–5.2)
BUN: 21 mg/dL (ref 6–23)
CO2: 24 mEq/L (ref 19–32)
Calcium: 9.4 mg/dL (ref 8.4–10.5)
Chloride: 102 mEq/L (ref 96–112)
Creatinine, Ser: 6.08 mg/dL — ABNORMAL HIGH (ref 0.50–1.10)
GFR calc Af Amer: 8 mL/min — ABNORMAL LOW (ref >90)
GFR calc non Af Amer: 7 mL/min — ABNORMAL LOW (ref >90)
Glucose, Bld: 86 mg/dL (ref 70–99)
Phosphorus: 4.7 mg/dL — ABNORMAL HIGH (ref 2.3–4.6)
Potassium: 3.7 mEq/L (ref 3.5–5.1)
Sodium: 138 mEq/L (ref 135–145)

## 2011-10-01 MED ORDER — DILTIAZEM HCL ER COATED BEADS 240 MG PO CP24: 240.0000 mg | ORAL_CAPSULE | Freq: Every day | ORAL | Status: DC

## 2011-10-01 MED ORDER — DEXTROSE 5 % IV SOLN
2.0000 g | INTRAVENOUS | Status: DC
  Administered 2011-10-01: 2 g via INTRAVENOUS
  Filled 2011-10-01: qty 2

## 2011-10-01 MED ORDER — DARBEPOETIN ALFA-POLYSORBATE 200 MCG/0.4ML IJ SOLN: 200.0000 ug | INTRAMUSCULAR | Status: DC

## 2011-10-01 MED ORDER — METOPROLOL TARTRATE 50 MG PO TABS: 50.0000 mg | ORAL_TABLET | Freq: Two times a day (BID) | ORAL | Status: DC

## 2011-10-01 MED ORDER — DEXTROSE 5 % IV SOLN: 2.0000 g | INTRAVENOUS | Status: AC

## 2011-10-01 MED ORDER — POLYETHYLENE GLYCOL 3350 17 G PO PACK
17.0000 g | PACK | Freq: Once | ORAL | Status: AC
  Administered 2011-10-01: 17 g via ORAL
  Filled 2011-10-01: qty 1

## 2011-10-01 NOTE — Progress Notes (Signed)
10/01/2011 [facility], [place] Case Management Note [id number]    CARE MANAGEMENT NOTE 10/01/2011  Patient:  [name], [name]   Account Number:  [id number]  Date Initiated:  09/11/2011  Documentation initiated by:  Olga Coaster  Subjective/Objective Assessment:   ADMITTED WITH PNEUMONIA     Action/Plan:   PATIENT RESIDES IN A SKILLED NURSING FACILITY; SOCIAL WORKER REFERRAL PLACED   Anticipated DC Date:  09/21/2011   Anticipated DC Plan:  SKILLED NURSING FACILITY  In-house referral  Clinical Social Worker         Choice offered to / List presented to:             Status of service:  In process, will continue to follow Medicare Important Message given?   (If response is "NO", the following Medicare IM given date fields will be blank) Date Medicare IM given:   Date Additional Medicare IM given:    Discharge Disposition:    Per UR Regulation:  Reviewed for med. necessity/level of care/duration of stay  If discussed at [place] of Stay Meetings, dates discussed:    Comments:  10/01/11-1623-J.Janthony Holleman,RN,BSN  [id number]      In to speak with patient. Anticipated discharge to [facility] in am, awaiting CPAP for facility. Outpatient dialysis is established at [facility] for [place], Wednesday, and Fridays on second shift. No further discharge needs identified.  3/5 team 6 picked pt up today on 3-5. from prev cm from snf, sw ref has been placed. hx w adv homecare. Vella Raring [id number] 2/26/2013Jabier Gauss RN, BSN, MHA

## 2011-10-01 NOTE — Discharge Summary (Addendum)
Physician Discharge Summary  Patient ID: [name] MRN: [id number] DOB/AGE: 1946-04-29 66 y.o.  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 Alvan Dame     Discharge Medications: Medication List  As of 10/01/2011  2:35 PM   STOP taking these medications         allopurinol 100 MG tablet      colchicine 0.6 MG tablet      DSS 100 MG Caps      xarelto      furosemide 80 MG tablet      gabapentin 300 MG capsule      heparin flush 10 UNIT/ML injection      HYDROcodone-acetaminophen 7.5-325 MG per tablet      KLOR-CON M20 20 MEQ tablet      methocarbamol 500 MG tablet      saccharomyces boulardii 250 MG capsule      TOPROL XL 100 MG 24 hr tablet         TAKE these medications         dextrose 5 % SOLN 50 mL with cefTAZidime 2 G SOLR 2 g   Inject 2 g into the vein every Monday, Wednesday, and Friday with hemodialysis. To complete on 10/17/11       diltiazem 240 MG 24 hr capsule   Commonly known as: CARDIZEM CD   Take 1 capsule (240 mg total) by mouth daily.      diphenhydrAMINE 25 MG tablet   Commonly known as: BENADRYL   Take 25 mg by mouth every 6 (six) hours as needed. Itching/rash      ferrous sulfate 325 (65 FE) MG tablet   Take 325 mg by mouth 3 (three) times daily with meals.      HECTOROL 0.5 MCG capsule   Generic drug: doxercalciferol   Take 0.5 mcg by mouth daily before breakfast.        metoprolol 50 MG tablet   Commonly known as: LOPRESSOR   Take 1 tablet (50 mg total) by mouth 2 (two) times daily.      multivitamins ther. w/minerals Tabs   Take 1 tablet by mouth daily.      nystatin 100000 UNIT/GM Powd   Apply 1 g topically daily as needed. Rash            polyethylene glycol packet   Commonly known as: MIRALAX / GLYCOLAX   Take 17 g by mouth 2 (two) times daily.                  sertraline 50 MG tablet   Commonly known as: ZOLOFT   Take 1 tablet (50 mg total) by mouth daily before breakfast.      sotalol 80 MG tablet   Commonly known as: BETAPACE   Take 80 mg by mouth every other day, q 48hours, next dose tonight 10/02/2011.  Brief H and P: For complete details please refer to admission H and P, but in brief patient is a 66 year old female who was present of skilled nursing Saturday, had removal of hardware from her right hip recently with spacer placement due to infection. On the day of admission she started "talking out of her head" and was sent to the ED. There was no report of any fevers. She had chronic chills and some nausea and vomiting. She also reported some mild diarrhea. She denied any shortness of breath, cough, wheezing. She reported decreased urine output patient was found to have pneumonia and a creatinine of 5.16. Baseline creatinine was 2.3  Hospital Course:  66 yo female admit 2/25 to [facility] with confusion/AMS and found to be in AKI with creat 5.1 (after 2/13 admit for R hip infection). She had no fever, chills, nausea or vomiting. She did complain of some diarrhea and has reported decreased urine output. On admission her creatinine was 5.16 and at baseline is 2.3. After the first dialysis 2/26 patient became obtunded had myoclonic seizures and had altered mental status and was noted that she was having very shallow breathing and critical care was consulted. She ultimately was intubated, extubated on 09/15/2011 and  kept on critical care service until 09/18/11.  Patient was worked up for the cause of her kidney failure. 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 24-hour UPEP was done and Dr. Verlon Au discussed the findings of this with oncologist Dr. Marin Olp who thinks it is highly unlikely that she has myeloma. Her being on steroids precludes renal biopsy. Patient had a tunnel catheter and AV fistula placed on 09/28/2011. Home black was removed on 09/28/2011, patient will followup with Dr. Alvan Dame, orthopedics in 2 weeks. She is to continue [place] with hemodialysis, to complete on 10/17/2011.   1. Acute on chronic respiratory failure, history of asthma-was on prednisone until 09/17/11. No current issues and stable 2. Encephalopathy-resolved, was secondary to possible renal insufficiency issues.  3. Sepsis due to infected right hip prosthesis status post removal and spacer placement 09/03/11 with I and D. and antibiotic beads placed and 3/12-had washout 3/2 with non-purulence and seroma - ID consulted and have recommended 6 weeks total of antibiotics -will need to continue ceftaz time every 48 hourly with renal dosing.  - wound VAC was removed on 09/28/11, followup with orthopedics in 2 weeks  4. Acute renal insufficiency on CKD. stage III, baseline creatinine 2.3: Cause remains undefined, creatinine worse after hip surgery on 09/03/2011, likely Vancomycin/Tobramycin toxicity vs ATN, AIN - vein mapping was done, right IJ hemodialysis catheter and left upper arm AV fistula placed was placed on 09/28/11, [facility] to followup in office in one month  5. Hypercalcemia-calcium 9.6, but prior to dilaysis yesterday was 12.3. Suspected from immobilization, myeloma workup was negative (Oncology recommended doing a 24-hour  urine protein electrophoresis which was essentially negative. Skeletal survey done negative for Myleoma) 6. ABLA/Iron deficiency-her baseline seems to be between 8 and 9, transfused this admission.  7. Leukocytosis-improving, afebrile  8. Atrial fibrillation-rate controlled  - on CCB, BB, not on coumadin. 9. Obstructive sleep apnea-continue CPAP 10. Hypertension-stable  Day of Discharge BP 138/65  Pulse 78  Temp(Src) 97.5 F (36.4 C) (Oral)  Resp 18  Ht 5\' 5"  (  1.651 m)  Wt 101.1 kg (222 lb 14.2 oz)  BMI 37.09 kg/m2  SpO2 98%  Physical Exam: General: Alert and awake oriented x3 not in any acute distress. HEENT: anicteric sclera, pupils reactive to light and accommodation CVS: S1-S2 clear no murmur rubs or gallops Chest: clear to auscultation bilaterally, no wheezing rales or rhonchi Abdomen: soft nontender, nondistended, normal bowel sounds, no organomegaly Extremities: no cyanosis, clubbing or edema noted bilaterally Neuro: Cranial nerves II-XII intact, no focal neurological deficits   The results of significant diagnostics from this hospitalization (including imaging, microbiology, ancillary and laboratory) are listed below for reference.    LAB RESULTS: Basic Metabolic Panel:  Lab [id number] 0657 09/28/11 0600  NA 138 139  K 3.7 3.5  CL 102 103  CO2 24 26  GLUCOSE 86 86  BUN 21 22  CREATININE 6.08* 5.56*  CALCIUM 9.4 10.8*  MG -- --  PHOS 4.7* --   Liver Function Tests:  Lab 10/01/11 0657 09/28/11 0600  AST -- --  ALT -- --  ALKPHOS -- --  BILITOT -- --  PROT -- --  ALBUMIN 2.2* 2.2*   CBC:  Lab 10/01/11 0657 09/28/11 0600  WBC 9.6 10.2  NEUTROABS -- --  HGB 9.7* 9.5*  HCT 30.7* 29.0*  MCV 88.2 --  PLT 156 195    Significant Diagnostic Studies:  Dg Chest 1 View  09/10/2011  *RADIOLOGY REPORT*  Clinical Data: Altered mental status.  CHEST - 1 VIEW  Comparison: 09/04/2011  Findings: The patient has a right-sided PICC line, tip to the superior vena  cava.  The heart is enlarged.  There is dense opacity at the left lung base which partially obscures the lateral hemidiaphragm.  Findings are consistent with infectious infiltrate. No evidence for pulmonary edema.  No evidence for pneumothorax. There is degenerative change in the spine.  IMPRESSION:  1.  Cardiomegaly without pulmonary edema. 2.  Left lower lobe infiltrate.  Original Report Authenticated By: Glenice Bow, M.D.   Ct Head Wo Contrast  09/10/2011  *RADIOLOGY REPORT*  Clinical Data: Confusion, weakness, previous stroke.  CT HEAD WITHOUT CONTRAST  Technique:  Contiguous axial images were obtained from the base of the skull through the vertex without contrast.  Comparison: 09/23/2008  Findings: The previously noted right thalamic hemorrhage has resolved. Atherosclerotic and physiologic intracranial calcifications.  Mild atrophy . There is no evidence of acute intracranial hemorrhage, brain edema, mass lesion, acute infarction,   mass effect, or midline shift. Acute infarct may be inapparent on noncontrast CT.  No other intra-axial abnormalities are seen, and the ventricles and sulci are within normal limits in size and symmetry.   No abnormal extra-axial fluid collections or masses are identified.  No significant calvarial abnormality.  IMPRESSION: 1. Negative for bleed or other acute intracranial process.  Original Report Authenticated By: Trecia Rogers, M.D.   US Abdomen Complete  09/11/2011  *RADIOLOGY REPORT*  Clinical Data:  Elevated INR, liver disease, morbid obesity, prior cholecystectomy  ULTRASOUND ABDOMEN:  Technique:  Sonography of upper abdominal structures was performed. Examination limited by body habitus and bowel gas.  Comparison:  Renal ultrasound 09/03/2006  Gallbladder:  Surgically absent  Common bile duct:  8 mm diameter, may be normal post cholecystectomy  Liver:  Normal appearance  IVC:  Normal appearance  Pancreas:  Suboptimally visualized due to bowel gas, with  portions of head and tail obscured.  Visualized portions normal appearance.  Spleen:  Normal appearance, 5.9 cm length  Right kidney:  10.8 cm length.  Increased cortical echogenicity. Small cyst 1.1 x 1.2 x 1.0 cm.  No gross hydronephrosis or shadowing calcification.  Left kidney:  Suboptimally visualized.  Approximately 10.7 cm length.  Marked cortical thinning.  Increased cortical echogenicity.  No gross mass or hydronephrosis seen on limited assessment.  Aorta:  Proximally normal caliber, remainder obscured by bowel gas.  Other:  No free fluid  IMPRESSION: Medical renal disease changes of both kidneys with cortical atrophy of left kidney. Post cholecystectomy with slightly dilated common bile duct 8 mm diameter, which may be physiologic; recommend correlation with LFTs. Incomplete visualization of pancreas, aorta and left kidney.  Original Report Authenticated By: Burnetta Sabin, M.D.     Disposition and Follow-up: Discharge Orders    Future Appointments: Provider: Department: Dept Phone: Center:   10/24/2011 11:15 AM Truman Hayward, MD [facility] [phone] [facility]   11/06/2011 11:00 AM Mal Misty, MD [facility] [phone] [facility]     Future Orders Please Complete By Expires   Increase activity slowly      Discharge instructions      Comments:   Renal diet     Wound care:  Remove old dressing right lower hip, irrigate with normal saline, pack wound with normal saline moist conform gauze- one continuous piece into tunneled area (at 5 oclock). Very important to pack all the way into this track(aprox 9cm deep) to facilitate drainage from wound. Cover with dry gauze/ABD, secure with tape. Change TID   DISPOSITION: SNF   DIET: RENAL DIET  ACTIVITY: AS TOLERATED   DISCHARGE FOLLOW-UP Follow-up Information    Follow up with Garnet Koyanagi, DO. Schedule an appointment as soon as possible for a visit in 2 weeks. (for hospital follow-up)       Follow up with EARLY, TODD, MD  in 4 weeks. (sent)    Contact information:   [address] [place] [phone]          Time spent on Discharge: 45 mins  Signed:  Naziah Weckerly M.D. [facility] 10/02/2011, 11:53 AM

## 2011-10-01 NOTE — Progress Notes (Addendum)
ANTIBIOTIC CONSULT NOTE - FOLLOW UP  Pharmacy Consult for Ceftazadime  Indication: pseudomonas prosthetic joint infection and pneumonia  Assessment: 14 YOF s/p recent removal of hip prosthesis and placement of antibiotic spacer. Cultures grew pan-sensitive P. Aeruginosa. Discharged on 2/21 and re-presented 2/25 with confusion from [facility]. Vanco/levofloxacin stopped following 6 days of tx. Continues on fortaz 1gm IV q48h Day #27. Pt is afebrile tmax 98.3, WBC improving (9.6<<10.2<<11.6<<12.5<<13.0). Will continue to monitor and f/u plans. CLIP process started for outpt HD slot, hopefully can be resolved today. Going to SNF near [employer]. Now on HD schedule of M/W/F, will adjust Fortaz. .   Plan:  1. Change Fortaz schedule to 2 gm IV QM/W/F after HD, per ID plan to continue for total of 6 weeks - stop date 10/16/11.  2. Cont to monitor renal fx, WBC, and tx plans   Gerrit Halls, PharmD [phone] 09/26/2011,12:25 PM   Allergies  Allergen Reactions  . Coumadin Other (See Comments)    Caused her to have a stroke    Patient Measurements: Height: 5\' 5"  (165.1 cm) Weight: 231 lb 7.7 oz (105 kg) IBW/kg (Calculated) : 57   Vital Signs: Temp: 97.3 F (36.3 C) (03/18 0620) Temp src: Oral (03/18 0620) BP: 110/73 mmHg (03/18 0830) Pulse Rate: 91  (03/18 0830) Intake/Output from previous day: 03/17 0701 - 03/18 0700 In: 480 [P.O.:480] Out: 125 [Urine:125] Intake/Output from this shift:    Labs:  [facility] 10/01/11 0657  WBC 9.6  HGB 9.7*  PLT 156  LABCREA --  CREATININE 6.08*   Estimated Creatinine Clearance: 11.4 ml/min (by C-G formula based on Cr of 6.08). No results found for this basename: VANCOTROUGH:2,VANCOPEAK:2,VANCORANDOM:2,GENTTROUGH:2,GENTPEAK:2,GENTRANDOM:2,TOBRATROUGH:2,TOBRAPEAK:2,TOBRARND:2,AMIKACINPEAK:2,AMIKACINTROU:2,AMIKACIN:2, in the last 72 hours   Microbiology: Recent Results (from the past 720 hour(s))  GRAM STAIN     Status: Normal   Collection Time     09/03/11  8:08 AM      Component Value Range Status Comment   Specimen Description FLUID SYNOVIAL RIGHT HIP ON SWAB   Final    Special Requests NONE   Final    Gram Stain     Final    Value: ABUNDANT WBC PRESENT, PREDOMINANTLY PMN     NO ORGANISMS SEEN     Gram Stain Report Called to,Read Back By and Verified With:     DAVENPORT,S. RN AT [id number] ON 09/03/11 BY GILLESPIE,B.   Report Status 09/03/2011 FINAL   Final   BODY FLUID CULTURE     Status: Normal   Collection Time   09/03/11  8:09 AM      Component Value Range Status Comment   Specimen Description FLUID SYNOVIAL RIGHT HIP ON SWAB   Final    Special Requests NONE   Final    Gram Stain     Final    Value: ABUNDANT WBC PRESENT, PREDOMINANTLY PMN     NO ORGANISMS SEEN     Gram Stain Report Called to,Read Back By and Verified With: Gram Stain Report Called to,Read Back By and Verified With: Carolanne Grumbling RN @ [phone] ON 09/03/11 BY Ethel Rana B Performed by [facility]   Culture     Final    Value: RARE PSEUDOMONAS AERUGINOSA     Note: CRITICAL RESULT CALLED TO, READ BACK BY AND VERIFIED WITH: KIMBERLY OWENS   Report Status 09/06/2011 FINAL   Final    Organism ID, Bacteria PSEUDOMONAS AERUGINOSA   Final   ANAEROBIC CULTURE     Status: Normal   Collection  Time   09/03/11  8:10 AM      Component Value Range Status Comment   Specimen Description FLUID SYNOVIAL RIGHT HIP   Final    Special Requests NONE   Final    Gram Stain     Final    Value: NO WBC SEEN     NO SQUAMOUS EPITHELIAL CELLS SEEN     NO ORGANISMS SEEN   Culture NO ANAEROBES ISOLATED   Final    Report Status 09/07/2011 FINAL   Final   CULTURE, BLOOD (ROUTINE X 2)     Status: Normal   Collection Time   09/10/11  7:55 PM      Component Value Range Status Comment   Specimen Description BLOOD   Final    Special Requests BOTTLES DRAWN AEROBIC AND ANAEROBIC 3CC   Final    Culture  Setup Time [id number]   Final    Culture NO GROWTH 5 DAYS   Final    Report Status  09/17/2011 FINAL   Final   CULTURE, BLOOD (ROUTINE X 2)     Status: Normal   Collection Time   09/10/11  8:00 PM      Component Value Range Status Comment   Specimen Description BLOOD LEFT ARM   Final    Special Requests BOTTLES DRAWN AEROBIC AND ANAEROBIC 4CC   Final    Culture  Setup Time [id number]   Final    Culture NO GROWTH 5 DAYS   Final    Report Status 09/17/2011 FINAL   Final   URINE CULTURE     Status: Normal   Collection Time   09/10/11 10:53 PM      Component Value Range Status Comment   Specimen Description URINE, CATHETERIZED   Final    Special Requests NONE   Final    Culture  Setup Time [id number]   Final    Colony Count NO GROWTH   Final    Culture NO GROWTH   Final    Report Status 09/12/2011 FINAL   Final   MRSA PCR SCREENING     Status: Normal   Collection Time   09/11/11 12:48 AM      Component Value Range Status Comment   MRSA by PCR NEGATIVE  NEGATIVE  Final   CULTURE, RESPIRATORY     Status: Normal   Collection Time   09/12/11  6:53 PM      Component Value Range Status Comment   Specimen Description TRACHEAL ASPIRATE   Final    Special Requests NONE   Final    Gram Stain     Final    Value: NO WBC SEEN     MODERATE SQUAMOUS EPITHELIAL CELLS PRESENT     RARE GRAM POSITIVE COCCI IN PAIRS   Culture Non-Pathogenic Oropharyngeal-type Flora Isolated.   Final    Report Status 09/14/2011 FINAL   Final   SURGICAL PCR SCREEN     Status: Normal   Collection Time   09/28/11  6:53 AM      Component Value Range Status Comment   MRSA, PCR NEGATIVE  NEGATIVE  Final    Staphylococcus aureus NEGATIVE  NEGATIVE  Final     Anti-infectives     Start     Dose/Rate Route Frequency Ordered Stop   09/24/11 1200   cefTAZidime (FORTAZ) 2 g in dextrose 5 % 50 mL IVPB        2 g 100 mL/hr over 30 Minutes Intravenous Every 48  hours 09/22/11 1235     09/22/11 1200   cefTAZidime (FORTAZ) 2 g in dextrose 5 % 50 mL IVPB  Status:  Discontinued        2 g 100 mL/hr over 30  Minutes Intravenous Every 24 hours 09/22/11 0750 09/22/11 1235   09/21/11 1200   cefTAZidime (FORTAZ) 2 g in dextrose 5 % 50 mL IVPB  Status:  Discontinued        2 g 100 mL/hr over 30 Minutes Intravenous Every 48 hours 09/19/11 1347 09/22/11 0750   09/20/11 1200   cefTAZidime (FORTAZ) 2 g in dextrose 5 % 50 mL IVPB  Status:  Discontinued        2 g 100 mL/hr over 30 Minutes Intravenous Every 24 hours 09/19/11 1329 09/19/11 1346   09/15/11 1110   tobramycin (NEBCIN) powder  Status:  Discontinued          As needed 09/15/11 1117 09/15/11 1135   09/15/11 1110   vancomycin (VANCOCIN) powder  Status:  Discontinued          As needed 09/15/11 1118 09/15/11 1135   09/15/11 1015   vancomycin (VANCOCIN) powder 2,000 mg  Status:  Discontinued        2,000 mg Other To Surgery 09/15/11 1009 09/16/11 1018   09/15/11 1015   tobramycin (NEBCIN) powder 1.2 g  Status:  Discontinued        1.2 g Topical To Surgery 09/15/11 1010 09/16/11 1018   09/15/11 1015   tobramycin (NEBCIN) powder 1.2 g  Status:  Discontinued        1.2 g Topical To Surgery 09/15/11 1010 09/16/11 1018   09/13/11 1100   cefTAZidime (FORTAZ) 2 g in dextrose 5 % 50 mL IVPB  Status:  Discontinued        2 g 100 mL/hr over 30 Minutes Intravenous Every 48 hours 09/13/11 1016 09/19/11 1329   09/12/11 2200   levofloxacin (LEVAQUIN) IVPB 500 mg  Status:  Discontinued        500 mg 100 mL/hr over 60 Minutes Intravenous Every 48 hours 09/11/11 0042 09/17/11 1250   09/12/11 0200   ceFEPIme (MAXIPIME) 1 g in dextrose 5 % 50 mL IVPB  Status:  Discontinued        1 g 100 mL/hr over 30 Minutes Intravenous Every 24 hours 09/11/11 1003 09/13/11 0851   09/11/11 0045   cefTRIAXone (ROCEPHIN) 1 g in dextrose 5 % 50 mL IVPB  Status:  Discontinued        1 g 100 mL/hr over 30 Minutes Intravenous Daily at bedtime 09/11/11 0017 09/11/11 1003   09/10/11 2300   vancomycin (VANCOCIN) 2,000 mg in sodium chloride 0.9 % 500 mL IVPB        2,000  mg 250 mL/hr over 120 Minutes Intravenous  Once 09/10/11 2200 09/11/11 0513   09/10/11 2145   ceFEPIme (MAXIPIME) 1 g in dextrose 5 % 50 mL IVPB        1 g 100 mL/hr over 30 Minutes Intravenous  Once 09/10/11 2138 09/11/11 0241   09/10/11 2145   Levofloxacin (LEVAQUIN) IVPB 750 mg        750 mg 100 mL/hr over 90 Minutes Intravenous  Once 09/10/11 2138 09/10/11 2323

## 2011-10-01 NOTE — Progress Notes (Signed)
Subjective: Interval History: none.  Objective: Vital signs in last 24 hours:  Temp:  [97.2 F (36.2 C)-98.3 F (36.8 C)] 97.3 F (36.3 C) (03/18 0620) Pulse Rate:  [75-98] 91  (03/18 0830) Resp:  [16-20] 16  (03/18 0830) BP: (98-168)/(60-76) 110/73 mmHg (03/18 0830) SpO2:  [98 %-100 %] 100 % (03/18 [id number]) Weight:  [105 kg (231 lb 7.7 oz)-107.4 kg (236 lb 12.4 oz)] 105 kg (231 lb 7.7 oz) (03/18 0620)  Weight change:   Intake/Output: I/O last 3 completed shifts: In: 69 [P.O.:730] Out: 125 [Urine:125]   Intake/Output this shift:    Gen: no distress [id number] regular in rate and rhythm Resp:Diminished breath sounds bibasally [id number], obese, nontender and bowel sounds are normal  Ext: trace to 1+ edema LE's,  new AVF LUA +bruit, R IJ TDC in place   Lab Results:  [facility] 10/01/11 0657  WBC 9.6  HGB 9.7*  HCT 30.7*  PLT 156   BMET  Basename 10/01/11 0657  NA 138  K 3.7  CL 102  CO2 24  GLUCOSE 86  BUN 21  CREATININE 6.08*  CALCIUM 9.4  PHOS 4.7*   LFT  Basename 10/01/11 0657  PROT --  ALBUMIN 2.2*  AST --  ALT --  ALKPHOS --  BILITOT --  BILIDIR --  IBILI --   PT/INR No results found for this basename: LABPROT:2,INR:2 in the last 72 hours Hepatitis Panel No results found for this basename: HEPBSAG,HCVAB,HEPAIGM,HEPBIGM in the last 72 hours  Studies/Results: No results found.  I have reviewed the patient's current medications.  Assessment/Plan: 1. Acute on CKD, new ESRD- MWF.  2. Hypercalcemia- no vitamin D 3. 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 Alvan Dame with sterile seroma; wound VAC removed yesterday.  4. HTN- stablestop norvasc on 2 CCB 5. Anemia- stable 6. Gout- stable 7. Resolved PNA 8. Volume excess- resolved  9.    Atrial; fibrillation not a coumadin candidate due to Intracerebral bleed  Rate controlled with CCB and BB  Seen on dialysis No issues hoping to  be placed at [facility]   LOS: 21 Elmo Shumard W @TODAY @9 :02 AM

## 2011-10-01 NOTE — Discharge Instructions (Signed)
Wound care:  Remove old dressing right lower hip, irrigate with normal saline, pack wound with normal saline moist conform gauze- one continuous piece into tunneled area (at 5 oclock). Very important to pack all the way into this track(aprox 9cm deep) to facilitate drainage from wound. Cover with dry gauze/ABD, secure with tape. Change TID

## 2011-10-01 NOTE — Progress Notes (Signed)
CSW spoke with patient's husband over the phone about patient's placement at  [facility]. CSW informed pt's husband that the SNF has accepted the patient and are awaiting the CPAP machine to arrive. Patient will probably be transferred in the am.  Rhea Pink, MSW, [place]

## 2011-10-01 NOTE — Progress Notes (Signed)
Patient refusing CPAP therapy tonight.  States that she will resume CPAP tomorrow night with her home machine.

## 2011-10-02 ENCOUNTER — Encounter (HOSPITAL_COMMUNITY): Payer: Self-pay | Admitting: Vascular Surgery

## 2011-10-02 MED ORDER — SOTALOL HCL 80 MG PO TABS: 80.0000 mg | ORAL_TABLET | ORAL | Status: DC

## 2011-10-02 MED ORDER — HYDROCODONE-ACETAMINOPHEN 7.5-325 MG PO TABS: 1.0000 | ORAL_TABLET | ORAL | Status: AC | PRN

## 2011-10-02 NOTE — Progress Notes (Signed)
Pt d/c'd to [facility] by ambulance. [facility] d/c'd. Belongings with husband. No peripheral iv.

## 2011-10-02 NOTE — Progress Notes (Signed)
Physical Therapy Treatment Patient Details Name: [name] MRN: [id number] DOB: Nov 20, 1945 Today's Date: 10/02/2011  PT Assessment/Plan  PT - Assessment/Plan Comments on Treatment Session: Pt tolerated tx well, but with frustration re: continued difficulty with hip and multiple surgeries. Motivated to increase I with mobility. Continuing instruction on bed mobility, transfers, maintaining TDWB.  PT Plan: Discharge plan remains appropriate PT Frequency: Min 2X/week Follow Up Recommendations: Skilled nursing facility Equipment Recommended: Defer to next venue PT Goals  Acute Rehab PT Goals PT Goal: Sit to Stand - Progress: Progressing toward goal PT Goal: Stand to Sit - Progress: Progressing toward goal PT Transfer Goal: Bed to Chair/Chair to Bed - Progress: Met PT Goal: Stand - Progress: Progressing toward goal  PT Treatment Precautions/Restrictions  Precautions Precautions: Fall Precaution Comments: o Restrictions Weight Bearing Restrictions: Yes RLE Weight Bearing: Touchdown weight bearing Other Position/Activity Restrictions: o Mobility (including Balance) Bed Mobility Sitting - Scoot to Edge of Bed: 5: Supervision Sitting - Scoot to Edge of Bed Details (indicate cue type and reason): Pt required increased time and Min A for steadying trunk, but was able to move each LE  and scoot hips to turn. Pt was already longsitting upon arrival.  Transfers Sit to Stand: 1: +2 Total assist;From elevated surface;With upper extremity assist;From bed Sit to Stand Details (indicate cue type and reason): Cues for hand placement and technique to maintain TDWB, relying on UEs to provide extra support. Pt 60% with 3 attempts and assist under pad at hips. Once standing x15 sec, pt stated she was unable to maintain TDWB, so sat back down.  Stand to Sit: 1: +2 Total assist;With upper extremity assist;To bed Stand to Sit Details: Steadying assist back to bed Lateral/Scoot Transfers: 3: Mod  assist;With armrests removed Lateral/Scoot Transfer Details (indicate cue type and reason): Cues for technique to lean forward and opposite head/hips relationship to help clear hips. Pt with difficulty clearing hips over arm rest, so provided assist with pad. Increased time required.  Ambulation/Gait Ambulation/Gait: No Stairs: No Wheelchair Mobility Wheelchair Mobility: No    Exercise    End of Session PT - End of Session Equipment Utilized During Treatment: Gait belt Activity Tolerance: Patient tolerated treatment well Patient left: in chair;with call bell in reach Nurse Communication: Mobility status for transfers General Behavior During Session: [facility] for tasks performed Cognition: [facility] for tasks performed  Soundra Pilon, [place]  10/02/2011, 11:40 AM

## 2011-10-02 NOTE — Progress Notes (Signed)
Subjective: Interval History: none.  Objective: Vital signs in last 24 hours:  Temp:  [97.7 F (36.5 C)-98.6 F (37 C)] 97.7 F (36.5 C) (03/19 0526) Pulse Rate:  [82-90] 82  (03/19 0526) Resp:  [16-20] 18  (03/19 0526) BP: (99-151)/(59-82) 122/59 mmHg (03/19 0526) SpO2:  [96 %-100 %] 97 % (03/19 0526) Weight:  [101.1 kg (222 lb 14.2 oz)-101.5 kg (223 lb 12.3 oz)] 101.1 kg (222 lb 14.2 oz) (03/18 2049)  Weight change: -5.9 kg (-13 lb 0.1 oz)  Intake/Output: I/O last 3 completed shifts: In: 770 [P.O.:720; IV Piggyback:50] Out: 3803 [Urine:175; [id number]; Stool:2]   Intake/Output this shift:     Gen: no distress  [id number] regular in rate and rhythm  Resp:Diminished breath sounds bibasally  [id number], obese, nontender and bowel sounds are normal  Ext: trace to 1+ edema LE's,  new AVF LUA +bruit, R IJ TDC in place   Lab Results:  [facility] 10/01/11 0657  WBC 9.6  HGB 9.7*  HCT 30.7*  PLT 156   BMET  Basename 10/01/11 0657  NA 138  K 3.7  CL 102  CO2 24  GLUCOSE 86  BUN 21  CREATININE 6.08*  CALCIUM 9.4  PHOS 4.7*   LFT  Basename 10/01/11 0657  PROT --  ALBUMIN 2.2*  AST --  ALT --  ALKPHOS --  BILITOT --  BILIDIR --  IBILI --   PT/INR No results found for this basename: LABPROT:2,INR:2 in the last 72 hours Hepatitis Panel No results found for this basename: HEPBSAG,HCVAB,HEPAIGM,HEPBIGM in the last 72 hours  Studies/Results: No results found.  I have reviewed the patient's current medications.  Assessment/Plan: 1. Acute on CKD, new ESRD- MWF.  2. Hypercalcemia- no vitamin D 3. 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 Alvan Dame with sterile seroma; wound VAC removed yesterday.  4. HTN- stablestop norvasc on 2 CCB 5. Anemia- stable 6. Gout- stable 7. Resolved PNA 8. Volume excess- resolved 9. Atrial; fibrillation not a coumadin candidate due to Intracerebral bleed Rate  controlled with CCB and BB  Patient to stop xarelto (Xa inhibitor)  And renally adjust the sotalol   LOS: 22 [name] W @TODAY @10 :18 AM

## 2011-10-02 NOTE — Progress Notes (Signed)
Clinical social worker assisted with patient discharge to skilled [facility].  CSW addressed all family questions and concerns. CSW copied chart and added all important documents. CSW also set up patient transportation with Diplomatic Services operational officer. Clinical Social Worker will sign off for now as social work intervention is no longer needed.   Rhea Pink, MSW, [place]

## 2011-10-02 NOTE — Progress Notes (Signed)
Patient ID: [name]  female  [id number]    DOB: 1945-11-26    DOA: 09/10/2011  PCP: Garnet Koyanagi, DO, DO  Subjective: Awaiting to be discharged today. PLEASE SEE DISCHARGE SUMMARY FROM YESTERDAY 10/01/2011, patient was awaiting out-pt HD spot  Objective: Weight change: -5.9 kg (-13 lb 0.1 oz)  Intake/Output Summary (Last 24 hours) at 10/02/11 1219 Last data filed at 10/02/11 0945  Gross per 24 hour  Intake    887 ml  Output     52 ml  Net    835 ml   Blood pressure 138/65, pulse 78, temperature 97.5 F (36.4 C), temperature source Oral, resp. rate 18, height 5\' 5"  (1.651 m), weight 101.1 kg (222 lb 14.2 oz), SpO2 98.00%.  Physical Exam: General: Alert and awake, oriented x3, not in any acute distress. HEENT: anicteric sclera, pupils reactive to light and accommodation, EOMI CVS: S1-S2 clear, no murmur rubs or gallops Chest: clear to auscultation bilaterally, no wheezing, rales or rhonchi Abdomen: soft nontender, nondistended, normal bowel sounds, no organomegaly Extremities: no cyanosis, clubbing or edema noted bilaterally,  Neuro: Cranial nerves II-XII intact, no focal neurological deficits  Lab Results: Basic Metabolic Panel:  Lab [id number] 0657 09/28/11 0600  NA 138 139  K 3.7 3.5  CL 102 103  CO2 24 26  GLUCOSE 86 86  BUN 21 22  CREATININE 6.08* 5.56*  CALCIUM 9.4 10.8*  MG -- --  PHOS 4.7* --   Liver Function Tests:  Lab 10/01/11 0657 09/28/11 0600  AST -- --  ALT -- --  ALKPHOS -- --  BILITOT -- --  PROT -- --  ALBUMIN 2.2* 2.2*   CBC:  Lab 10/01/11 0657 09/28/11 0600  WBC 9.6 10.2  NEUTROABS -- --  HGB 9.7* 9.5*  HCT 30.7* 29.0*  MCV 88.2 87.3  PLT 156 195  CBG: No results found for this basename: GLUCAP:5 in the last 168 hours   Micro Results:  Studies/Results: Dg Chest 1 View  09/10/2011  *RADIOLOGY REPORT*  Clinical Data: Altered mental status.  CHEST - 1 VIEW  Comparison: 09/04/2011  Findings: The patient has a right-sided PICC  line, tip to the superior vena cava.  The heart is enlarged.  There is dense opacity at the left lung base which partially obscures the lateral hemidiaphragm.  Findings are consistent with infectious infiltrate. No evidence for pulmonary edema.  No evidence for pneumothorax. There is degenerative change in the spine.  IMPRESSION:  1.  Cardiomegaly without pulmonary edema. 2.  Left lower lobe infiltrate.  Original Report Authenticated By: Glenice Bow, M.D.   Ct Head Wo Contrast  09/12/2011  *RADIOLOGY REPORT*  Clinical Data: 66 year old female with decreased level of consciousness.  CT HEAD WITHOUT CONTRAST  Technique:  Contiguous axial images were obtained from the base of the skull through the vertex without contrast.  Comparison: 09/10/2011 and earlier.  Findings: Mild bubbly opacity in the right sphenoid.  Other Visualized paranasal sinuses and mastoids are clear.  The patient is intubated.  No acute scalp or orbit soft tissue findings. No acute osseous abnormality identified.  Stable cerebral volume.  No ventriculomegaly. Stable gray-white matter differentiation throughout the brain.  No midline shift, mass effect, or evidence of mass lesion.  No acute intracranial hemorrhage identified.  No evidence of cortically based acute infarction identified.  No suspicious intracranial vascular hyperdensity.  IMPRESSION: 1.  Stable noncontrast CT appearance the brain without acute intracranial abnormality. 2.  Mild paranasal sinus inflammatory changes  in the setting of intubation.  Original Report Authenticated By: Randall An, M.D.   Ct Head Wo Contrast  09/10/2011  *RADIOLOGY REPORT*  Clinical Data: Confusion, weakness, previous stroke.  CT HEAD WITHOUT CONTRAST  Technique:  Contiguous axial images were obtained from the base of the skull through the vertex without contrast.  Comparison: 09/23/2008  Findings: The previously noted right thalamic hemorrhage has resolved. Atherosclerotic and physiologic  intracranial calcifications.  Mild atrophy . There is no evidence of acute intracranial hemorrhage, brain edema, mass lesion, acute infarction,   mass effect, or midline shift. Acute infarct may be inapparent on noncontrast CT.  No other intra-axial abnormalities are seen, and the ventricles and sulci are within normal limits in size and symmetry.   No abnormal extra-axial fluid collections or masses are identified.  No significant calvarial abnormality.  IMPRESSION: 1. Negative for bleed or other acute intracranial process.  Original Report Authenticated By: Trecia Rogers, M.D.   US Abdomen Complete  09/11/2011  *RADIOLOGY REPORT*  Clinical Data:  Elevated INR, liver disease, morbid obesity, prior cholecystectomy  ULTRASOUND ABDOMEN:  Technique:  Sonography of upper abdominal structures was performed. Examination limited by body habitus and bowel gas.  Comparison:  Renal ultrasound 09/03/2006  Gallbladder:  Surgically absent  Common bile duct:  8 mm diameter, may be normal post cholecystectomy  Liver:  Normal appearance  IVC:  Normal appearance  Pancreas:  Suboptimally visualized due to bowel gas, with portions of head and tail obscured.  Visualized portions normal appearance.  Spleen:  Normal appearance, 5.9 cm length  Right kidney:  10.8 cm length.  Increased cortical echogenicity. Small cyst 1.1 x 1.2 x 1.0 cm.  No gross hydronephrosis or shadowing calcification.  Left kidney:  Suboptimally visualized.  Approximately 10.7 cm length.  Marked cortical thinning.  Increased cortical echogenicity.  No gross mass or hydronephrosis seen on limited assessment.  Aorta:  Proximally normal caliber, remainder obscured by bowel gas.  Other:  No free fluid  IMPRESSION: Medical renal disease changes of both kidneys with cortical atrophy of left kidney. Post cholecystectomy with slightly dilated common bile duct 8 mm diameter, which may be physiologic; recommend correlation with LFTs. Incomplete visualization of  pancreas, aorta and left kidney.  Original Report Authenticated By: Burnetta Sabin, M.D.   Dg Chest Port 1 View  09/16/2011  *RADIOLOGY REPORT*  Clinical Data: Respiratory failure  PORTABLE CHEST - 1 VIEW  Comparison:   the previous day's study  Findings: Right IJ central line and left to right arm PICC remain in place.  The patient has been extubated and the nasogastric tube removed.  Stable cardiomegaly.  Cannot exclude small left pleural effusion.  Slightly improved aeration with concomitant decrease in perihilar and bibasilar atelectasis or interstitial infiltrates.  IMPRESSION:  1.  Extubation with slightly improved aeration.  Original Report Authenticated By: Trecia Rogers, M.D.   Dg Chest Port 1 View  09/15/2011  *RADIOLOGY REPORT*  Clinical Data: Intubated  PORTABLE CHEST - 1 VIEW  Comparison:   the previous day's study  Findings: Endotracheal tube, nasogastric tube, and right IJ central line are stable in position.  Stable cardiomegaly.  Relatively low lung volumes with persistent perihilar and bibasilar atelectasis or interstitial infiltrates.  Cannot exclude small left pleural effusion.  IMPRESSION:  1.  Little change from previous day's portable exam.  Original Report Authenticated By: Trecia Rogers, M.D.   Dg Chest Port 1 View  09/14/2011  *RADIOLOGY REPORT*  Clinical Data: Follow up of infiltrate.  Shortness of breath. Morbid obesity.  PORTABLE CHEST - 1 VIEW  Comparison: 1 day prior  Findings: Endotracheal tube is unchanged, with tip at the level of the clavicles.  Nasogastric tube extends beyond the inferior aspect of the film.  Right IJ central line unchanged.  Mild cardiomegaly.  No right pleural effusion.  Cannot exclude small left pleural effusion. No pneumothorax.  Low lung volumes. Interval development of mild pulmonary venous congestion.  Mild bibasilar volume loss.  IMPRESSION: Cardiomegaly and low lung volumes with mild pulmonary venous congestion.  Similar patchy  bibasilar atelectasis.  Possible small left pleural effusion.  Original Report Authenticated By: Areta Haber, M.D.   Dg Chest Port 1 View  09/13/2011  *RADIOLOGY REPORT*  Clinical Data: Endotracheal tube.  PORTABLE CHEST - 1 VIEW  Comparison: 09/12/2011.  Findings: Endotracheal tube is in satisfactory position. Nasogastric tube is followed into the stomach, with the tip projecting beyond the inferior boundary of the film.  Right IJ central line and right PICC tips project over the SVC.  Heart is at the upper limits of normal in size, stable.  Left perihilar and right infrahilar air space disease persists.  Question left pleural effusion.  IMPRESSION: Left perihilar and right basilar airspace disease persist. Probable left pleural effusion.  Original Report Authenticated By: Luretha Rued, M.D.   Dg Chest Port 1 View  09/12/2011  *RADIOLOGY REPORT*  Clinical Data: Respiratory failure and status post intubation.  PORTABLE CHEST - 1 VIEW  Comparison: Film at 1030 hours.  Findings: Endotracheal tube present with the tip approximately 2.5 cm above the carina.  Stable positioning of temporary dialysis catheter with the tip in the lower SVC.  Additional right-sided PICC line tip continues to lie at the level of the proximal SVC. Nasogastric tube continues to extend into the stomach.  Lungs show stable cardiomegaly and left lower lobe atelectasis.  No overt edema present.  No evidence of pneumothorax.  No significant pleural fluid.  IMPRESSION: Endotracheal tube appropriately positioned with tip 2.5 cm above the carina.  Otherwise no significant change in appearance of the chest.  Original Report Authenticated By: Azzie Roup, M.D.   Dg Chest Port 1 View  09/12/2011  *RADIOLOGY REPORT*  Clinical Data: Line placement.  PORTABLE CHEST - 1 VIEW  Comparison: 09/12/2011  Findings: Right central line placement.  The tip is in the SVC.  No pneumothorax.  Right PICC line is unchanged.  Cardiomegaly.  Mild  vascular congestion. No effusions.  Probable left basilar opacity, unchanged.  No overt edema.  IMPRESSION: Right central line tip in the SVC.  No pneumothorax.  Otherwise no change.  Original Report Authenticated By: Raelyn Number, M.D.   Dg Chest Port 1 View  09/12/2011  *RADIOLOGY REPORT*  Clinical Data: Edema, renal failure  PORTABLE CHEST - 1 VIEW  Comparison: 09/10/2011  Findings: Cardiomegaly again noted.  No pulmonary edema.  Stable right arm PICC line position with tip in upper SVC.  Stable left basilar atelectasis or infiltrate.  IMPRESSION: No pulmonary edema.  Again noted left basilar atelectasis or infiltrate.  Original Report Authenticated By: Lahoma Crocker, M.D.   Dg Chest Port 1 View  09/04/2011  *RADIOLOGY REPORT*  Clinical Data: For central line placement  PORTABLE CHEST - 1 VIEW  Comparison: Portable chest x-ray of 11/21 for a 1012  Findings: The right upper extremity PICC line tip is in the mid lower SVC.  No pneumothorax is seen.  Cardiomegaly is stable.  A lap band is present.  IMPRESSION: Right PICC line tip in mid lower SVC.  No pneumothorax.  Original Report Authenticated By: Joretta Bachelor, M.D.   Dg Bone Survey Met  09/22/2011  *RADIOLOGY REPORT*  Clinical Data: Bone pain.  Right hip pain.  Clinical concern for multiple myeloma.  METASTATIC BONE SURVEY  Comparison: Previous related examinations.  Findings:  Lateral skull:  No lytic lesions.  AP and lateral cervical spine:  Multilevel degenerative changes. Mild scoliosis.  Mild reversal of the normal cervical lordosis.  No lytic lesions.  AP and lateral thoracic spine:  Multilevel degenerative changes, including changes of DISH.  No lytic lesions.  AP and lateral lumbar spine:  Mild levoconvex rotary scoliosis. Multilevel degenerative changes.  No lytic lesions.  AP pelvis:  Absence of the proximal right femur with extensive soft tissue calcific density suggesting methylmethacrylate.  No lytic lesions.  AP femurs:  Absence of the  proximal right femur with screw holes in the remaining midportion of the right femur.  Extensive calcific density in the soft tissues compatible with methylmethacrylate. Unremarkable proximal left femur.  No lytic lesions.  AP humeri:  Normal, without lytic lesions.  AP shoulders:  Mild bilateral acromioclavicular spur formation.  No lytic lesions.  IMPRESSION: No lytic lesions or other findings suspicious for multiple myeloma.  Original Report Authenticated By: Gerald Stabs, M.D.    Medications: Scheduled Meds:    . cefTAZidime (FORTAZ)  IV  2 g Intravenous Q M,W,F-HD  . darbepoetin (ARANESP) injection - NON-DIALYSIS  200 mcg Subcutaneous Q Thu-1800  . diltiazem  240 mg Oral Daily  . feeding supplement  1 Container Oral BID  . metoprolol tartrate  50 mg Oral BID  . polyethylene glycol  17 g Oral Once  . sotalol  80 mg Oral Q48H   Continuous Infusions:    . sodium chloride 500 mL (09/29/11 2112)     Assessment/Plan:             Please see DC summary for details   1. Acute on chronic respiratory failure, history of asthma-was on prednisone until 09/17/11. No current issues and stable  2. Encephalopathy-resolved, was secondary to possible renal insufficiency issues.   3. 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 and antibiotic beads placed and 3/12-had washout 3/2 with non-purulence and seroma - ID consulted and have recommended 6 weeks total of antibiotics finishing 10/17/2011-will need to continue ceftaz time every 48 hourly with renal dosing.  - wound VAC removed on 09/28/11, followup with orthopedics in 2 weeks   4. Acute renal insufficiency on CKD. stage III, baseline creatinine 2.3:  Cause remains undefined, creatinine worse after hip surgery on 09/03/2011, likely Vancomycin/Tobramycin toxicity vs ATN, AIN - vein mapping done,  right IJ hemodialysis catheter and left upper arm AV fistula placed, VVS to followup in  office in one month  5. Hypercalcemia-calcium 9.6, but prior to dilaysis yesterday was 12.3. Suspected from immobilization, myeloma workup negative (Oncology recommended doing a 24-hour urine protein electrophoresis which was essentially negative. Skeletal survey done negative for Myleoma)  6. ABLA/Iron deficiency-her baseline seems to be between 8 and 9, transfused this admission.  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 Atrial fibrillation-rate controlled, on sotalol q 48hours  - on CCB, BB, not on coumadin.  8. Obstructive sleep apnea-continue CPAP, lowest setting 4  9. Hypertension-stable   DVT Prophylaxis: SCD's  Code Status: Full Code  Disposition: DC to SNF today   LOS: 32 days   Elmor Kost M.D. [facility] 10/02/2011, 12:19 PM Pager: [phone]

## 2011-10-08 ENCOUNTER — Telehealth: Payer: Self-pay | Admitting: *Deleted

## 2011-10-08 NOTE — Telephone Encounter (Signed)
Received call from [place] at [facility] to request a copy of pt pneumonia vaccine per pt advised she had the shot here, noted pt had shot 04-25-2005, faxed copy to [phone]

## 2011-10-17 ENCOUNTER — Inpatient Hospital Stay: Payer: Medicare Other | Admitting: Infectious Disease

## 2011-10-24 ENCOUNTER — Encounter: Payer: Self-pay | Admitting: Infectious Disease

## 2011-10-24 ENCOUNTER — Ambulatory Visit (INDEPENDENT_AMBULATORY_CARE_PROVIDER_SITE_OTHER): Payer: Medicare Other | Admitting: Infectious Disease

## 2011-10-24 VITALS — BP 125/84 | HR 76 | Temp 97.6°F

## 2011-10-24 DIAGNOSIS — N186 End stage renal disease: Secondary | ICD-10-CM | POA: Insufficient documentation

## 2011-10-24 DIAGNOSIS — A498 Other bacterial infections of unspecified site: Secondary | ICD-10-CM | POA: Insufficient documentation

## 2011-10-24 DIAGNOSIS — Z96649 Presence of unspecified artificial hip joint: Secondary | ICD-10-CM

## 2011-10-24 DIAGNOSIS — T8459XA Infection and inflammatory reaction due to other internal joint prosthesis, initial encounter: Secondary | ICD-10-CM | POA: Insufficient documentation

## 2011-10-24 DIAGNOSIS — B965 Pseudomonas (aeruginosa) (mallei) (pseudomallei) as the cause of diseases classified elsewhere: Secondary | ICD-10-CM

## 2011-10-24 DIAGNOSIS — T8450XA Infection and inflammatory reaction due to unspecified internal joint prosthesis, initial encounter: Secondary | ICD-10-CM

## 2011-10-24 NOTE — Assessment & Plan Note (Signed)
Minor stand all type material was removed by Dr. Alvan Dame to clarify what had this with him. If this in fact is the case terminated her intravenous antibiotic therapy next Wednesday. If there was residual metal or hardware in place I will consider prolonging her antibiotic therapy with oral ciprofloxacin for several months. I will check a sedimentation rate and C-reactive protein with her next blood draw with hemodialysis.

## 2011-10-24 NOTE — Assessment & Plan Note (Signed)
Culprit organism

## 2011-10-24 NOTE — Progress Notes (Signed)
Subjective:    Patient ID: DAMBER BARWICK, female    DOB: [birth year], 66 y.o.   MRN: [id number]  HPI  66 y.o. female with CKD, atrial fibrillation, HTN and THA infection sp I and D and washout with removal of all prosthetic components in February. She grew  pseudomonas from the synovial fluid culture was placed on intravenous Fortaz on February 20. As I last saw her she has had unfortunate turn of events with worsening renal failure requiring hemodialysis. My understanding renal failure was thought to be moderate multifactorial and partly due to seepage of tobramycin beads from the hip surgery into the systemic circulation also with supratherapeutic but levels of vancomycin temporarily early in her stay in February and other possible factors. She has had improvement in pain in her hip but still does have pain at times. She is nonweightbearing. She then followed closely by Dr. Charm Rings. Currently resides at [facility]. She is getting hemodialysis at  [facility] on [address] next to [employer]. He did get [place] after hemodialysis. She is without fever chills nausea malaise. I spent greater than 45 minutes with the patient including greater than 50% of time in face to face counsel of the patient and in coordination of their care.   Review of Systems  Constitutional: Negative for fever, chills, diaphoresis, activity change, appetite change, fatigue and unexpected weight change.  HENT: Negative for congestion, sore throat, rhinorrhea, sneezing, trouble swallowing and sinus pressure.   Eyes: Negative for photophobia and visual disturbance.  Respiratory: Negative for cough, chest tightness, shortness of breath, wheezing and stridor.   Cardiovascular: Negative for chest pain, palpitations and leg swelling.  Gastrointestinal: Negative for nausea, vomiting, abdominal pain, diarrhea, constipation, blood in stool, abdominal distention and anal bleeding.  Genitourinary: Negative for dysuria,  hematuria, flank pain and difficulty urinating.  Musculoskeletal: Positive for arthralgias. Negative for myalgias, back pain, joint swelling and gait problem.  Skin: Positive for wound. Negative for color change, pallor and rash.  Neurological: Negative for dizziness, tremors, weakness and light-headedness.  Hematological: Negative for adenopathy. Does not bruise/bleed easily.  Psychiatric/Behavioral: Negative for behavioral problems, confusion, sleep disturbance, dysphoric mood, decreased concentration and agitation.       Objective:   Physical Exam  Constitutional: She is oriented to person, place, and time. She appears well-developed and well-nourished. No distress.  HENT:  Head: Normocephalic and atraumatic.  Mouth/Throat: Oropharynx is clear and moist. No oropharyngeal exudate.  Eyes: Conjunctivae and EOM are normal. Pupils are equal, round, and reactive to light. No scleral icterus.  Neck: Normal range of motion. Neck supple. No JVD present.  Cardiovascular: Normal rate, regular rhythm and normal heart sounds.  Exam reveals no gallop and no friction rub.   No murmur heard. Pulmonary/Chest: Effort normal and breath sounds normal. No respiratory distress. She has no wheezes. She has no rales. She exhibits no tenderness.  Abdominal: She exhibits no distension and no mass. There is no tenderness. There is no rebound and no guarding.  Musculoskeletal: She exhibits no edema and no tenderness.       Legs: Lymphadenopathy:    She has no cervical adenopathy.  Neurological: She is alert and oriented to person, place, and time. She has normal reflexes. She exhibits normal muscle tone. Coordination normal.  Skin: Skin is warm and dry. She is not diaphoretic. No erythema. No pallor.  Psychiatric: She has a normal mood and affect. Her behavior is normal. Judgment and thought content normal.  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 Alvan Dame to clarify what had this with him. If this in fact is the case terminated her intravenous antibiotic therapy next Wednesday. If there was residual metal or hardware in place I will consider prolonging her antibiotic therapy with oral ciprofloxacin for several months. I will check a sedimentation rate and C-reactive protein with her next blood draw with hemodialysis.  Pseudomonas aeruginosa infection Culprit organism  ESRD (end stage renal disease) Likely multifactorial it may have been due to seepage of tobramycin into the circulation systemically as well as supratherapeutic vancomycin levels and other factors. She did have chronic kidney disease prior to her worsening of renal failure 2 end-stage renal disease requiring hemodialysis.

## 2011-10-24 NOTE — Assessment & Plan Note (Signed)
Likely multifactorial it may have been due to seepage of tobramycin into the circulation systemically as well as supratherapeutic vancomycin levels and other factors. She did have chronic kidney disease prior to her worsening of renal failure 2 end-stage renal disease requiring hemodialysis.

## 2011-10-24 NOTE — Patient Instructions (Signed)
I will confirm with Dr. Alvan Dame that ALL hardware was removed  IF so I would then like to STOP your IV ceftazidime (fortaz) after your dose on the 17th I would like some labs drawn with dialysis tomorrow and faxed to me as well

## 2011-11-05 ENCOUNTER — Encounter: Payer: Self-pay | Admitting: Vascular Surgery

## 2011-11-06 ENCOUNTER — Encounter: Payer: Self-pay | Admitting: Vascular Surgery

## 2011-11-06 ENCOUNTER — Ambulatory Visit (INDEPENDENT_AMBULATORY_CARE_PROVIDER_SITE_OTHER): Payer: Medicare Other | Admitting: Vascular Surgery

## 2011-11-06 VITALS — BP 127/81 | HR 77 | Resp 18 | Ht 65.0 in | Wt 202.0 lb

## 2011-11-06 DIAGNOSIS — N186 End stage renal disease: Secondary | ICD-10-CM

## 2011-11-06 HISTORY — DX: End stage renal disease: N18.6

## 2011-11-06 NOTE — Progress Notes (Signed)
The patient presents today for followup of her right IJ hemodialysis catheter placement and left upper arm AV fistula creation by myself on 09/28/2011. She was an inpatient during that procedure. She has now been discharged to a nursing facility. She is here today with her family. She reports that she is having dialysis at [facility] hemodialysis in her ascending no difficulty with her catheter. Physical exam she does have a patent left upper arm AV fistula with a good thrill throughout the upper arm. The vein does run a rather deep in her forearm. She is moderately obese. I imaged this with SonoSite ultrasound and this showed good size maturation at one month. This is deep under the fat.  I discussed this with the patient and her family. I have recommended superficial is ablation of her cephalic vein fistula to hopefully prepare this for hemodialysis access. She understands that would take a minimum of 3 months for maturation. I recommend that we proceed with the alteration to move it more closely to the surface as an outpatient at the hospital. She understands and we will proceed with this on April 29 with local and sedation

## 2011-11-09 ENCOUNTER — Encounter (HOSPITAL_COMMUNITY): Payer: Self-pay | Admitting: *Deleted

## 2011-11-09 ENCOUNTER — Encounter (HOSPITAL_COMMUNITY): Payer: Self-pay | Admitting: Respiratory Therapy

## 2011-11-09 ENCOUNTER — Other Ambulatory Visit: Payer: Self-pay

## 2011-11-11 MED ORDER — CEFAZOLIN SODIUM-DEXTROSE 2-3 GM-% IV SOLR
2.0000 g | Freq: Once | INTRAVENOUS | Status: AC
  Administered 2011-11-12: 2 g via INTRAVENOUS
  Filled 2011-11-11: qty 50

## 2011-11-12 ENCOUNTER — Encounter (HOSPITAL_COMMUNITY): Payer: Self-pay | Admitting: Anesthesiology

## 2011-11-12 ENCOUNTER — Encounter (HOSPITAL_COMMUNITY): Payer: Self-pay | Admitting: Surgery

## 2011-11-12 ENCOUNTER — Telehealth: Payer: Self-pay | Admitting: Vascular Surgery

## 2011-11-12 ENCOUNTER — Ambulatory Visit (HOSPITAL_COMMUNITY): Payer: Medicare Other | Admitting: Anesthesiology

## 2011-11-12 ENCOUNTER — Encounter (HOSPITAL_COMMUNITY)
Admission: RE | Disposition: A | Discharge status: Skilled Nursing Facility | Payer: Self-pay | Source: Ambulatory Visit | Attending: Vascular Surgery

## 2011-11-12 ENCOUNTER — Ambulatory Visit (HOSPITAL_COMMUNITY)
Admission: RE | Admit: 2011-11-12 | Discharge: 2011-11-12 | Disposition: A | Discharge status: Skilled Nursing Facility | Payer: Medicare Other | Source: Ambulatory Visit | Attending: Vascular Surgery | Admitting: Vascular Surgery

## 2011-11-12 DIAGNOSIS — Z992 Dependence on renal dialysis: Secondary | ICD-10-CM | POA: Insufficient documentation

## 2011-11-12 DIAGNOSIS — Z8673 Personal history of transient ischemic attack (TIA), and cerebral infarction without residual deficits: Secondary | ICD-10-CM | POA: Insufficient documentation

## 2011-11-12 DIAGNOSIS — N186 End stage renal disease: Secondary | ICD-10-CM

## 2011-11-12 DIAGNOSIS — G4730 Sleep apnea, unspecified: Secondary | ICD-10-CM | POA: Insufficient documentation

## 2011-11-12 DIAGNOSIS — T82898A Other specified complication of vascular prosthetic devices, implants and grafts, initial encounter: Secondary | ICD-10-CM

## 2011-11-12 DIAGNOSIS — F3289 Other specified depressive episodes: Secondary | ICD-10-CM | POA: Insufficient documentation

## 2011-11-12 DIAGNOSIS — E669 Obesity, unspecified: Secondary | ICD-10-CM | POA: Insufficient documentation

## 2011-11-12 DIAGNOSIS — F329 Major depressive disorder, single episode, unspecified: Secondary | ICD-10-CM | POA: Insufficient documentation

## 2011-11-12 DIAGNOSIS — I120 Hypertensive chronic kidney disease with stage 5 chronic kidney disease or end stage renal disease: Secondary | ICD-10-CM | POA: Insufficient documentation

## 2011-11-12 LAB — SURGICAL PCR SCREEN
MRSA, PCR: NEGATIVE
Staphylococcus aureus: POSITIVE — AB

## 2011-11-12 LAB — POCT I-STAT 4, (NA,K, GLUC, HGB,HCT)
Glucose, Bld: 97 mg/dL (ref 70–99)
HCT: 40 % (ref 36.0–46.0)
Hemoglobin: 13.6 g/dL (ref 12.0–15.0)
Potassium: 4.4 mEq/L (ref 3.5–5.1)
Sodium: 140 mEq/L (ref 135–145)

## 2011-11-12 LAB — GLUCOSE, CAPILLARY: Glucose-Capillary: 77 mg/dL (ref 70–99)

## 2011-11-12 SURGERY — REVISON OF ARTERIOVENOUS FISTULA
Anesthesia: Monitor Anesthesia Care | Site: Arm Lower | Laterality: Left | Wound class: Clean

## 2011-11-12 MED ORDER — LIDOCAINE-EPINEPHRINE 0.5-1:200000 % IJ SOLN
INTRAMUSCULAR | Status: DC | PRN
  Administered 2011-11-12: 25 mL via INTRADERMAL

## 2011-11-12 MED ORDER — MIDAZOLAM HCL 5 MG/5ML IJ SOLN
INTRAMUSCULAR | Status: DC | PRN
  Administered 2011-11-12: 2 mg via INTRAVENOUS

## 2011-11-12 MED ORDER — MUPIROCIN 2 % EX OINT
TOPICAL_OINTMENT | CUTANEOUS | Status: AC
  Administered 2011-11-12: 1 via NASAL
  Filled 2011-11-12: qty 22

## 2011-11-12 MED ORDER — ONDANSETRON HCL 4 MG/2ML IJ SOLN
INTRAMUSCULAR | Status: DC | PRN
  Administered 2011-11-12: 4 mg via INTRAVENOUS

## 2011-11-12 MED ORDER — SODIUM CHLORIDE 0.9 % IR SOLN
Status: DC | PRN
  Administered 2011-11-12: 1000 mL

## 2011-11-12 MED ORDER — PROPOFOL 10 MG/ML IV EMUL
INTRAVENOUS | Status: DC | PRN
  Administered 2011-11-12: 20 mg via INTRAVENOUS
  Administered 2011-11-12 (×2): 10 mg via INTRAVENOUS
  Administered 2011-11-12 (×3): 20 mg via INTRAVENOUS
  Administered 2011-11-12 (×2): 10 mg via INTRAVENOUS
  Administered 2011-11-12: 20 mg via INTRAVENOUS

## 2011-11-12 MED ORDER — SODIUM CHLORIDE 0.9 % IV SOLN
INTRAVENOUS | Status: DC | PRN
  Administered 2011-11-12 (×2): via INTRAVENOUS

## 2011-11-12 MED ORDER — PHENYLEPHRINE HCL 10 MG/ML IJ SOLN
INTRAMUSCULAR | Status: DC | PRN
  Administered 2011-11-12 (×4): 100 ug via INTRAVENOUS

## 2011-11-12 MED ORDER — FENTANYL CITRATE 0.05 MG/ML IJ SOLN
INTRAMUSCULAR | Status: DC | PRN
  Administered 2011-11-12: 100 ug via INTRAVENOUS
  Administered 2011-11-12 (×3): 50 ug via INTRAVENOUS

## 2011-11-12 MED ORDER — SODIUM CHLORIDE 0.9 % IR SOLN
Status: DC | PRN
  Administered 2011-11-12: 12:00:00

## 2011-11-12 MED ORDER — HYDROCODONE-ACETAMINOPHEN 7.5-325 MG PO TABS: 1.0000 | ORAL_TABLET | Freq: Four times a day (QID) | ORAL | Status: DC | PRN

## 2011-11-12 MED ORDER — GLYCOPYRROLATE 0.2 MG/ML IJ SOLN
INTRAMUSCULAR | Status: DC | PRN
  Administered 2011-11-12: 0.2 mg via INTRAVENOUS

## 2011-11-12 MED ORDER — SODIUM CHLORIDE 0.9 % IV SOLN: INTRAVENOUS | Status: DC

## 2011-11-12 MED ORDER — MUPIROCIN 2 % EX OINT
TOPICAL_OINTMENT | Freq: Two times a day (BID) | CUTANEOUS | Status: DC
  Administered 2011-11-12: 1 via NASAL
  Filled 2011-11-12: qty 22

## 2011-11-12 MED ORDER — DEXTROSE 5 % IV SOLN
INTRAVENOUS | Status: DC | PRN
  Administered 2011-11-12: 12:00:00 via INTRAVENOUS

## 2011-11-12 SURGICAL SUPPLY — 37 items
BENZOIN TINCTURE PRP APPL 2/3 (GAUZE/BANDAGES/DRESSINGS) ×2 IMPLANT
CANISTER SUCTION 2500CC (MISCELLANEOUS) ×2 IMPLANT
CLIP LIGATING EXTRA MED SLVR (CLIP) ×2 IMPLANT
CLIP LIGATING EXTRA SM BLUE (MISCELLANEOUS) ×2 IMPLANT
CLOTH BEACON ORANGE TIMEOUT ST (SAFETY) ×2 IMPLANT
CLSR STERI-STRIP ANTIMIC 1/2X4 (GAUZE/BANDAGES/DRESSINGS) ×2 IMPLANT
COVER PROBE W GEL 5X96 (DRAPES) ×4 IMPLANT
COVER SURGICAL LIGHT HANDLE (MISCELLANEOUS) ×4 IMPLANT
DECANTER SPIKE VIAL GLASS SM (MISCELLANEOUS) ×2 IMPLANT
ELECT REM PT RETURN 9FT ADLT (ELECTROSURGICAL) ×2
ELECTRODE REM PT RTRN 9FT ADLT (ELECTROSURGICAL) ×1 IMPLANT
GEL ULTRASOUND 20GR AQUASONIC (MISCELLANEOUS) IMPLANT
GLOVE BIO SURGEON STRL SZ 6.5 (GLOVE) ×2 IMPLANT
GLOVE BIOGEL PI IND STRL 6.5 (GLOVE) ×1 IMPLANT
GLOVE BIOGEL PI IND STRL 7.5 (GLOVE) ×1 IMPLANT
GLOVE BIOGEL PI INDICATOR 6.5 (GLOVE) ×1
GLOVE BIOGEL PI INDICATOR 7.5 (GLOVE) ×1
GLOVE SS BIOGEL STRL SZ 7.5 (GLOVE) ×1 IMPLANT
GLOVE SUPERSENSE BIOGEL SZ 7.5 (GLOVE) ×1
GLOVE SURG SS PI 7.5 STRL IVOR (GLOVE) ×2 IMPLANT
GOWN STRL NON-REIN LRG LVL3 (GOWN DISPOSABLE) ×4 IMPLANT
KIT BASIN OR (CUSTOM PROCEDURE TRAY) ×2 IMPLANT
KIT ROOM TURNOVER OR (KITS) ×2 IMPLANT
NS IRRIG 1000ML POUR BTL (IV SOLUTION) ×2 IMPLANT
PACK CV ACCESS (CUSTOM PROCEDURE TRAY) ×2 IMPLANT
PAD ARMBOARD 7.5X6 YLW CONV (MISCELLANEOUS) ×4 IMPLANT
SPONGE GAUZE 4X4 12PLY (GAUZE/BANDAGES/DRESSINGS) ×2 IMPLANT
STRIP CLOSURE SKIN 1/2X4 (GAUZE/BANDAGES/DRESSINGS) ×2 IMPLANT
SUT PROLENE 6 0 CC (SUTURE) ×2 IMPLANT
SUT VIC AB 3-0 SH 27 (SUTURE) ×4
SUT VIC AB 3-0 SH 27X BRD (SUTURE) ×4 IMPLANT
SUT VICRYL 4-0 PS2 18IN ABS (SUTURE) ×2 IMPLANT
TAPE CLOTH SURG 4X10 WHT LF (GAUZE/BANDAGES/DRESSINGS) ×2 IMPLANT
TOWEL OR 17X24 6PK STRL BLUE (TOWEL DISPOSABLE) ×2 IMPLANT
TOWEL OR 17X26 10 PK STRL BLUE (TOWEL DISPOSABLE) ×2 IMPLANT
UNDERPAD 30X30 INCONTINENT (UNDERPADS AND DIAPERS) ×2 IMPLANT
WATER STERILE IRR 1000ML POUR (IV SOLUTION) ×2 IMPLANT

## 2011-11-12 NOTE — OR Nursing (Signed)
Pre operative assessment completed by Forrest Moron RN

## 2011-11-12 NOTE — Interval H&P Note (Signed)
History and Physical Interval Note:  11/12/2011 11:55 AM  [name]  has presented today for surgery, with the diagnosis of ESRD  The various methods of treatment have been discussed with the patient and family. After consideration of risks, benefits and other options for treatment, the patient has consented to  Procedure(s) (LRB): REVISON OF ARTERIOVENOUS FISTULA (Left) as a surgical intervention .  The patients' history has been reviewed, patient examined, no change in status, stable for surgery.  I have reviewed the patients' chart and labs.  Questions were answered to the patient's satisfaction.     [name]

## 2011-11-12 NOTE — Telephone Encounter (Signed)
Left message for patient to call and confirm appt

## 2011-11-12 NOTE — Op Note (Signed)
OPERATIVE REPORT  DATE OF SURGERY: 11/12/2011  PATIENT: [name], 66 y.o. female MRN: [id number]  DOB: 04-07-1946  PRE-OPERATIVE DIAGNOSIS: End-stage renal disease  POST-OPERATIVE DIAGNOSIS:  Same  PROCEDURE: Superficialization of left upper arm AV fistula  SURGEON:  Curt Jews, M.D.  PHYSICIAN ASSISTANT: Roczniak  ANESTHESIA:  MAC  EBL: Minimal ml  Total I/O In: 550 [I.V.:550] Out: 25 [Blood:25]  BLOOD ADMINISTERED: None  DRAINS: None  SPECIMEN: None  COUNTS CORRECT:  YES  PLAN OF CARE: PACU stable   PATIENT DISPOSITION:  PACU - hemodynamically stable  PROCEDURE DETAILS: The patient was taken to the operating room and placed supine position where the area of the left upper arm was prepped and draped in the usual sterile fashion. Using SonoSite ultrasound the cephalic vein fistula was marked from the antecubital space to the shoulder. The patient had good Dulcie Gammon size maturation but the fistula was deepened below the fat. Using local anesthesia 3 separate incisions were made along the course of the cephalic vein fistula. One was just above the antecubital space, the other was at the shoulder, and the other was mid upper arm. The cephalic vein fistula was mobilized circumferentially from the arteriovenous anastomosis up to the shoulder. This was done by mobilizing it from the under the fascia and ligating tributary branches with 301 4-0 silk ties and dividing them. The fat was mobilized under the skin bridges as well as the incision. This was to allow the vein does lie just under the skin. Stasis was obtained with electrocautery. Wound irrigated with saline. 3-0 Vicryl in a running fashion was used to close the fascia below the level of the cephalic vein. This was done and all 3 incisions. Next the skin incisions were closed with subcuticular Vicryl suture taking care to place vein just under the skin. Benzoin Steri-Strips were applied to the skin incisions. The patient was  taken to the recovery room in stable condition.   Curt Jews, M.D. 11/12/2011 1:50 PM

## 2011-11-12 NOTE — Progress Notes (Signed)
IVF DC'd to blue port of right [place].  Flushed with 10cc NS and 2.4cc heparin (1,000 units/cc)  Capped and clamped

## 2011-11-12 NOTE — Telephone Encounter (Signed)
Message copied by Renaldo Harrison on Mon Nov 12, 2011  4:33 PM ------      Message from: Alfonso Patten      Created: Mon Nov 12, 2011  2:20 PM                   ----- Message -----         From: Richrd Prime, [place]         Sent: 11/12/2011   1:53 PM           To: Alfonso Patten, RN            4 week F/U Dr. Donnetta Hutching left avf

## 2011-11-12 NOTE — OR Nursing (Signed)
Spoke with staff nurse and receptionist at [facility] re pt ready for transport back there in ~ 30 mins from [facility] / [phone] given number, Acknowledged will notify transporattion staff

## 2011-11-12 NOTE — Preoperative (Signed)
Beta Blockers   Reason not to administer Beta Blockers:Not Applicable, taken at 7am per patient

## 2011-11-12 NOTE — Transfer of Care (Signed)
Immediate Anesthesia Transfer of Care Note  Patient: [name]  Procedure(s) Performed: Procedure(s) (LRB): REVISON OF ARTERIOVENOUS FISTULA (Left)  Patient Location: PACU  Anesthesia Type: MAC  Level of Consciousness: awake, alert  and oriented  Airway & Oxygen Therapy: Patient Spontanous Breathing and Patient connected to nasal cannula oxygen  Post-op Assessment: Report given to PACU RN and Post -op Vital signs reviewed and stable  Post vital signs: Reviewed and stable  Complications: No apparent anesthesia complications

## 2011-11-12 NOTE — Anesthesia Preprocedure Evaluation (Addendum)
Anesthesia Evaluation  Patient identified by MRN, date of birth, ID band  Reviewed: Allergy & Precautions, H&P , NPO status , Patient's Chart, lab work & pertinent test results, reviewed documented beta blocker date and time   Airway Mallampati: I TM Distance: >3 FB Neck ROM: Full    Dental  (+) Teeth Intact and Dental Advisory Given   Pulmonary asthma , sleep apnea and Continuous Positive Airway Pressure Ventilation ,          Cardiovascular hypertension, Pt. on medications and Pt. on home beta blockers + dysrhythmias Atrial Fibrillation     Neuro/Psych Depression CVA, No Residual Symptoms    GI/Hepatic negative GI ROS, Neg liver ROS,   Endo/Other  negative endocrine ROS  Renal/GU ESRF and DialysisRenal disease  negative genitourinary   Musculoskeletal  (+) Arthritis -, Osteoarthritis,    Abdominal   Peds  Hematology negative hematology ROS (+)   Anesthesia Other Findings   Reproductive/Obstetrics negative OB ROS                          Anesthesia Physical Anesthesia Plan  ASA: IV  Anesthesia Plan: MAC   Post-op Pain Management:    Induction: Intravenous  Airway Management Planned: Nasal Cannula  Additional Equipment:   Intra-op Plan:   Post-operative Plan:   Informed Consent: I have reviewed the patients History and Physical, chart, labs and discussed the procedure including the risks, benefits and alternatives for the proposed anesthesia with the patient or authorized representative who has indicated his/her understanding and acceptance.   Dental advisory given  Plan Discussed with:   Anesthesia Plan Comments:         Anesthesia Quick Evaluation

## 2011-11-12 NOTE — H&P (View-Only) (Signed)
The patient presents today for followup of her right IJ hemodialysis catheter placement and left upper arm AV fistula creation by myself on 09/28/2011. She was an inpatient during that procedure. She has now been discharged to a nursing facility. She is here today with her family. She reports that she is having dialysis at [facility] hemodialysis in her ascending no difficulty with her catheter. Physical exam she does have a patent left upper arm AV fistula with a good thrill throughout the upper arm. The vein does run a rather deep in her forearm. She is moderately obese. I imaged this with SonoSite ultrasound and this showed good size maturation at one month. This is deep under the fat.  I discussed this with the patient and her family. I have recommended superficial is ablation of her cephalic vein fistula to hopefully prepare this for hemodialysis access. She understands that would take a minimum of 3 months for maturation. I recommend that we proceed with the alteration to move it more closely to the surface as an outpatient at the hospital. She understands and we will proceed with this on April 29 with local and sedation

## 2011-11-13 NOTE — Anesthesia Postprocedure Evaluation (Signed)
  Anesthesia Post-op Note  Patient: [name]  Procedure(s) Performed: Procedure(s) (LRB): REVISON OF ARTERIOVENOUS FISTULA (Left)  Patient Location: PACU  Anesthesia Type: MAC  Level of Consciousness: awake, alert  and oriented  Airway and Oxygen Therapy: Patient Spontanous Breathing  Post-op Pain: mild  Post-op Assessment: Post-op Vital signs reviewed and Patient's Cardiovascular Status Stable  Post-op Vital Signs: stable  Complications: No apparent anesthesia complications

## 2011-11-14 HISTORY — PX: VENA CAVA FILTER PLACEMENT: SUR1032

## 2011-12-03 ENCOUNTER — Encounter: Payer: Self-pay | Admitting: Vascular Surgery

## 2011-12-04 ENCOUNTER — Encounter: Payer: Self-pay | Admitting: Vascular Surgery

## 2011-12-04 ENCOUNTER — Ambulatory Visit: Payer: Medicare Other | Admitting: Infectious Disease

## 2011-12-04 ENCOUNTER — Ambulatory Visit (INDEPENDENT_AMBULATORY_CARE_PROVIDER_SITE_OTHER): Payer: Medicare Other | Admitting: Vascular Surgery

## 2011-12-04 VITALS — BP 116/59 | HR 66 | Resp 16 | Ht 65.0 in | Wt 202.0 lb

## 2011-12-04 DIAGNOSIS — N186 End stage renal disease: Secondary | ICD-10-CM

## 2011-12-04 NOTE — Progress Notes (Signed)
The patient has today for followup of fistulization of her left upper arm AV fistula. This was initially placed by myself in 09/28/2011 she had good Farron Watrous size maturation but had very deeply running cephalic vein due to her obesity. She underwent mobilization under the skin as an outpatient on 11/12/2011 and she is here today for followup  Physical exam reveals a easily palpable cephalic vein fistula in the left upper arm. Her incisions are healing quite nicely in the vein is very superficial with good size she is currently being dialyzed via a right IJ catheter  Have recommended continued allowance for this to heal and would recommend accessing this around July 1. She will contact us should she develop any difficulty with her access. I did recommend using the fistula for several sessions prior to removing her catheter

## 2011-12-07 ENCOUNTER — Inpatient Hospital Stay (HOSPITAL_COMMUNITY)
Admission: EM | Admit: 2011-12-07 | Discharge: 2011-12-09 | DRG: 252 | Disposition: A | Discharge status: Skilled Nursing Facility | Payer: Medicare Other | Attending: Internal Medicine | Admitting: Internal Medicine

## 2011-12-07 ENCOUNTER — Other Ambulatory Visit: Payer: Self-pay

## 2011-12-07 DIAGNOSIS — I4891 Unspecified atrial fibrillation: Secondary | ICD-10-CM

## 2011-12-07 DIAGNOSIS — I10 Essential (primary) hypertension: Secondary | ICD-10-CM | POA: Diagnosis present

## 2011-12-07 DIAGNOSIS — G4733 Obstructive sleep apnea (adult) (pediatric): Secondary | ICD-10-CM

## 2011-12-07 DIAGNOSIS — Z8673 Personal history of transient ischemic attack (TIA), and cerebral infarction without residual deficits: Secondary | ICD-10-CM

## 2011-12-07 DIAGNOSIS — I120 Hypertensive chronic kidney disease with stage 5 chronic kidney disease or end stage renal disease: Secondary | ICD-10-CM | POA: Diagnosis present

## 2011-12-07 DIAGNOSIS — D649 Anemia, unspecified: Secondary | ICD-10-CM | POA: Diagnosis present

## 2011-12-07 DIAGNOSIS — N186 End stage renal disease: Secondary | ICD-10-CM | POA: Diagnosis present

## 2011-12-07 DIAGNOSIS — I480 Paroxysmal atrial fibrillation: Secondary | ICD-10-CM | POA: Diagnosis present

## 2011-12-07 DIAGNOSIS — D72829 Elevated white blood cell count, unspecified: Secondary | ICD-10-CM | POA: Diagnosis present

## 2011-12-07 DIAGNOSIS — Z9989 Dependence on other enabling machines and devices: Secondary | ICD-10-CM

## 2011-12-07 DIAGNOSIS — F329 Major depressive disorder, single episode, unspecified: Secondary | ICD-10-CM | POA: Diagnosis present

## 2011-12-07 DIAGNOSIS — Z888 Allergy status to other drugs, medicaments and biological substances status: Secondary | ICD-10-CM

## 2011-12-07 DIAGNOSIS — Z79899 Other long term (current) drug therapy: Secondary | ICD-10-CM

## 2011-12-07 DIAGNOSIS — I6350 Cerebral infarction due to unspecified occlusion or stenosis of unspecified cerebral artery: Secondary | ICD-10-CM | POA: Diagnosis present

## 2011-12-07 DIAGNOSIS — M109 Gout, unspecified: Secondary | ICD-10-CM | POA: Diagnosis present

## 2011-12-07 DIAGNOSIS — I82409 Acute embolism and thrombosis of unspecified deep veins of unspecified lower extremity: Principal | ICD-10-CM | POA: Diagnosis present

## 2011-12-07 DIAGNOSIS — G8190 Hemiplegia, unspecified affecting unspecified side: Secondary | ICD-10-CM

## 2011-12-07 DIAGNOSIS — F3289 Other specified depressive episodes: Secondary | ICD-10-CM | POA: Diagnosis present

## 2011-12-07 DIAGNOSIS — Z86718 Personal history of other venous thrombosis and embolism: Secondary | ICD-10-CM | POA: Diagnosis present

## 2011-12-07 DIAGNOSIS — N2581 Secondary hyperparathyroidism of renal origin: Secondary | ICD-10-CM | POA: Diagnosis present

## 2011-12-07 DIAGNOSIS — I82402 Acute embolism and thrombosis of unspecified deep veins of left lower extremity: Secondary | ICD-10-CM

## 2011-12-07 DIAGNOSIS — Z6835 Body mass index (BMI) 35.0-35.9, adult: Secondary | ICD-10-CM

## 2011-12-07 HISTORY — DX: Heart failure, unspecified: I50.9

## 2011-12-07 NOTE — ED Notes (Addendum)
FROM [facility]. ACUTE THROMBUS TO LEFT COMMON FEMORAL VEIN, SUPERFICIAL FEMORAL VEIN AND POPLITEAL VEIN WITH MILD BLOOD FLOW. CONFIRMED BY OUTPATIENT ULTRASOUND. WAS SENT FOR FURTHER EVALUATION AND TREATMENT. PATIENT HAS LEFT LEG EDEMA. NO REDNESS. RECENT HX RIGHT FEMUR SURGERY AND REMOVAL OF RIGHT HIP PROSTHESIS, WITH OPEN WOUND. DIALYSIS M-W-F VIA LEFT ARM SHUNT, UNMATURED. DIALYSIS TO RIGHT SUBCLAVIAN. NO SOB OR CHEST PAIN.

## 2011-12-08 ENCOUNTER — Encounter (HOSPITAL_COMMUNITY): Payer: Self-pay | Admitting: Family Medicine

## 2011-12-08 ENCOUNTER — Inpatient Hospital Stay (HOSPITAL_COMMUNITY): Payer: Medicare Other

## 2011-12-08 DIAGNOSIS — I749 Embolism and thrombosis of unspecified artery: Secondary | ICD-10-CM

## 2011-12-08 DIAGNOSIS — G4733 Obstructive sleep apnea (adult) (pediatric): Secondary | ICD-10-CM

## 2011-12-08 DIAGNOSIS — I619 Nontraumatic intracerebral hemorrhage, unspecified: Secondary | ICD-10-CM

## 2011-12-08 DIAGNOSIS — I4891 Unspecified atrial fibrillation: Secondary | ICD-10-CM

## 2011-12-08 DIAGNOSIS — Z86718 Personal history of other venous thrombosis and embolism: Secondary | ICD-10-CM | POA: Diagnosis present

## 2011-12-08 DIAGNOSIS — I10 Essential (primary) hypertension: Secondary | ICD-10-CM

## 2011-12-08 DIAGNOSIS — M79609 Pain in unspecified limb: Secondary | ICD-10-CM

## 2011-12-08 LAB — DIFFERENTIAL
Basophils Absolute: 0.1 10*3/uL (ref 0.0–0.1)
Basophils Relative: 1 % (ref 0–1)
Eosinophils Absolute: 0.1 10*3/uL (ref 0.0–0.7)
Eosinophils Relative: 1 % (ref 0–5)
Lymphocytes Relative: 16 % (ref 12–46)
Lymphs Abs: 2.1 10*3/uL (ref 0.7–4.0)
Monocytes Absolute: 1.6 10*3/uL — ABNORMAL HIGH (ref 0.1–1.0)
Monocytes Relative: 12 % (ref 3–12)
Neutro Abs: 9.4 10*3/uL — ABNORMAL HIGH (ref 1.7–7.7)
Neutrophils Relative %: 70 % (ref 43–77)

## 2011-12-08 LAB — CBC
HCT: 39 % (ref 36.0–46.0)
HCT: 39.6 % (ref 36.0–46.0)
HCT: 38.4 % (ref 36.0–46.0)
Hemoglobin: 12.4 g/dL (ref 12.0–15.0)
Hemoglobin: 12.5 g/dL (ref 12.0–15.0)
Hemoglobin: 12.4 g/dL (ref 12.0–15.0)
MCH: 26.2 pg (ref 26.0–34.0)
MCH: 26.1 pg (ref 26.0–34.0)
MCH: 26.1 pg (ref 26.0–34.0)
MCHC: 31.8 g/dL (ref 30.0–36.0)
MCHC: 31.6 g/dL (ref 30.0–36.0)
MCHC: 32.3 g/dL (ref 30.0–36.0)
MCV: 82.5 fL (ref 78.0–100.0)
MCV: 82.7 fL (ref 78.0–100.0)
MCV: 80.8 fL (ref 78.0–100.0)
Platelets: 178 10*3/uL (ref 150–400)
Platelets: 193 10*3/uL (ref 150–400)
Platelets: 190 10*3/uL (ref 150–400)
RBC: 4.73 MIL/uL (ref 3.87–5.11)
RBC: 4.79 MIL/uL (ref 3.87–5.11)
RBC: 4.75 MIL/uL (ref 3.87–5.11)
RDW: 16 % — ABNORMAL HIGH (ref 11.5–15.5)
RDW: 16 % — ABNORMAL HIGH (ref 11.5–15.5)
RDW: 15.9 % — ABNORMAL HIGH (ref 11.5–15.5)
WBC: 13.8 10*3/uL — ABNORMAL HIGH (ref 4.0–10.5)
WBC: 13.4 10*3/uL — ABNORMAL HIGH (ref 4.0–10.5)
WBC: 14.6 10*3/uL — ABNORMAL HIGH (ref 4.0–10.5)

## 2011-12-08 LAB — BASIC METABOLIC PANEL
BUN: 21 mg/dL (ref 6–23)
BUN: 26 mg/dL — ABNORMAL HIGH (ref 6–23)
BUN: 39 mg/dL — ABNORMAL HIGH (ref 6–23)
CO2: 25 mEq/L (ref 19–32)
CO2: 25 mEq/L (ref 19–32)
CO2: 26 mEq/L (ref 19–32)
Calcium: 10 mg/dL (ref 8.4–10.5)
Calcium: 9.5 mg/dL (ref 8.4–10.5)
Calcium: 9.8 mg/dL (ref 8.4–10.5)
Chloride: 95 mEq/L — ABNORMAL LOW (ref 96–112)
Chloride: 95 mEq/L — ABNORMAL LOW (ref 96–112)
Chloride: 96 mEq/L (ref 96–112)
Creatinine, Ser: 2.78 mg/dL — ABNORMAL HIGH (ref 0.50–1.10)
Creatinine, Ser: 3.48 mg/dL — ABNORMAL HIGH (ref 0.50–1.10)
Creatinine, Ser: 4.54 mg/dL — ABNORMAL HIGH (ref 0.50–1.10)
GFR calc Af Amer: 11 mL/min — ABNORMAL LOW (ref >90)
GFR calc Af Amer: 15 mL/min — ABNORMAL LOW (ref >90)
GFR calc Af Amer: 20 mL/min — ABNORMAL LOW (ref >90)
GFR calc non Af Amer: 13 mL/min — ABNORMAL LOW (ref >90)
GFR calc non Af Amer: 17 mL/min — ABNORMAL LOW (ref >90)
GFR calc non Af Amer: 9 mL/min — ABNORMAL LOW (ref >90)
Glucose, Bld: 89 mg/dL (ref 70–99)
Glucose, Bld: 92 mg/dL (ref 70–99)
Glucose, Bld: 95 mg/dL (ref 70–99)
Potassium: 4 mEq/L (ref 3.5–5.1)
Potassium: 4.3 mEq/L (ref 3.5–5.1)
Potassium: 4.4 mEq/L (ref 3.5–5.1)
Sodium: 132 mEq/L — ABNORMAL LOW (ref 135–145)
Sodium: 134 mEq/L — ABNORMAL LOW (ref 135–145)
Sodium: 135 mEq/L (ref 135–145)

## 2011-12-08 LAB — PROTIME-INR
INR: 1.05 (ref 0.00–1.49)
Prothrombin Time: 13.9 seconds (ref 11.6–15.2)

## 2011-12-08 LAB — TYPE AND SCREEN
ABO/RH(D): O POS
Antibody Screen: NEGATIVE

## 2011-12-08 LAB — SURGICAL PCR SCREEN
MRSA, PCR: POSITIVE — AB
Staphylococcus aureus: POSITIVE — AB

## 2011-12-08 LAB — APTT: aPTT: 34 seconds (ref 24–37)

## 2011-12-08 IMAGING — CR DG CHEST 2V
2 series · 2 of 2 positions shown · non-contrast
Comparison: [DATE]

CLINICAL DATA: Deep venous thrombosis.  History of stroke, asthma,
atrial fibrillation, and cardiomyopathy.

CHEST - 2 VIEW

[w chest lat]
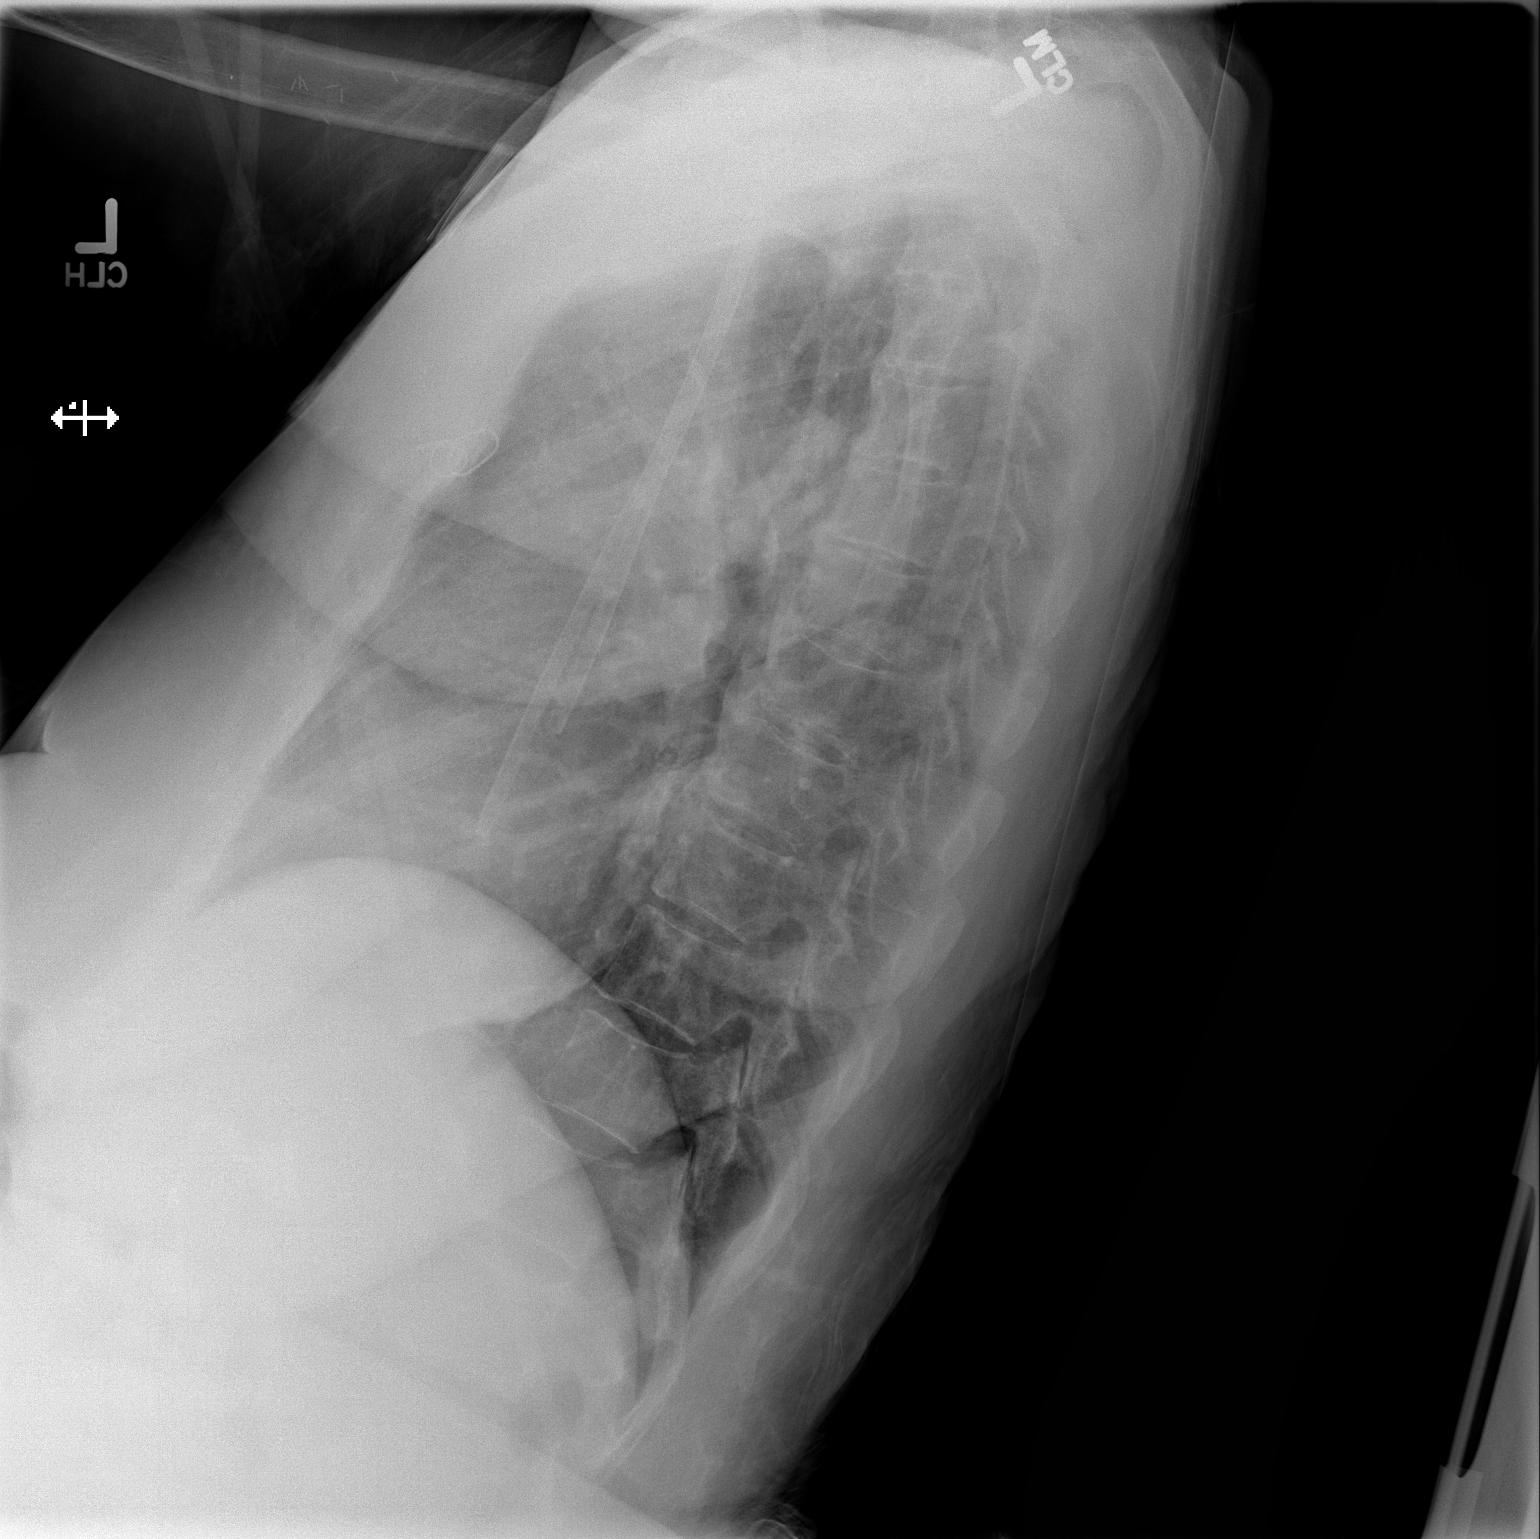

[x chest ap]
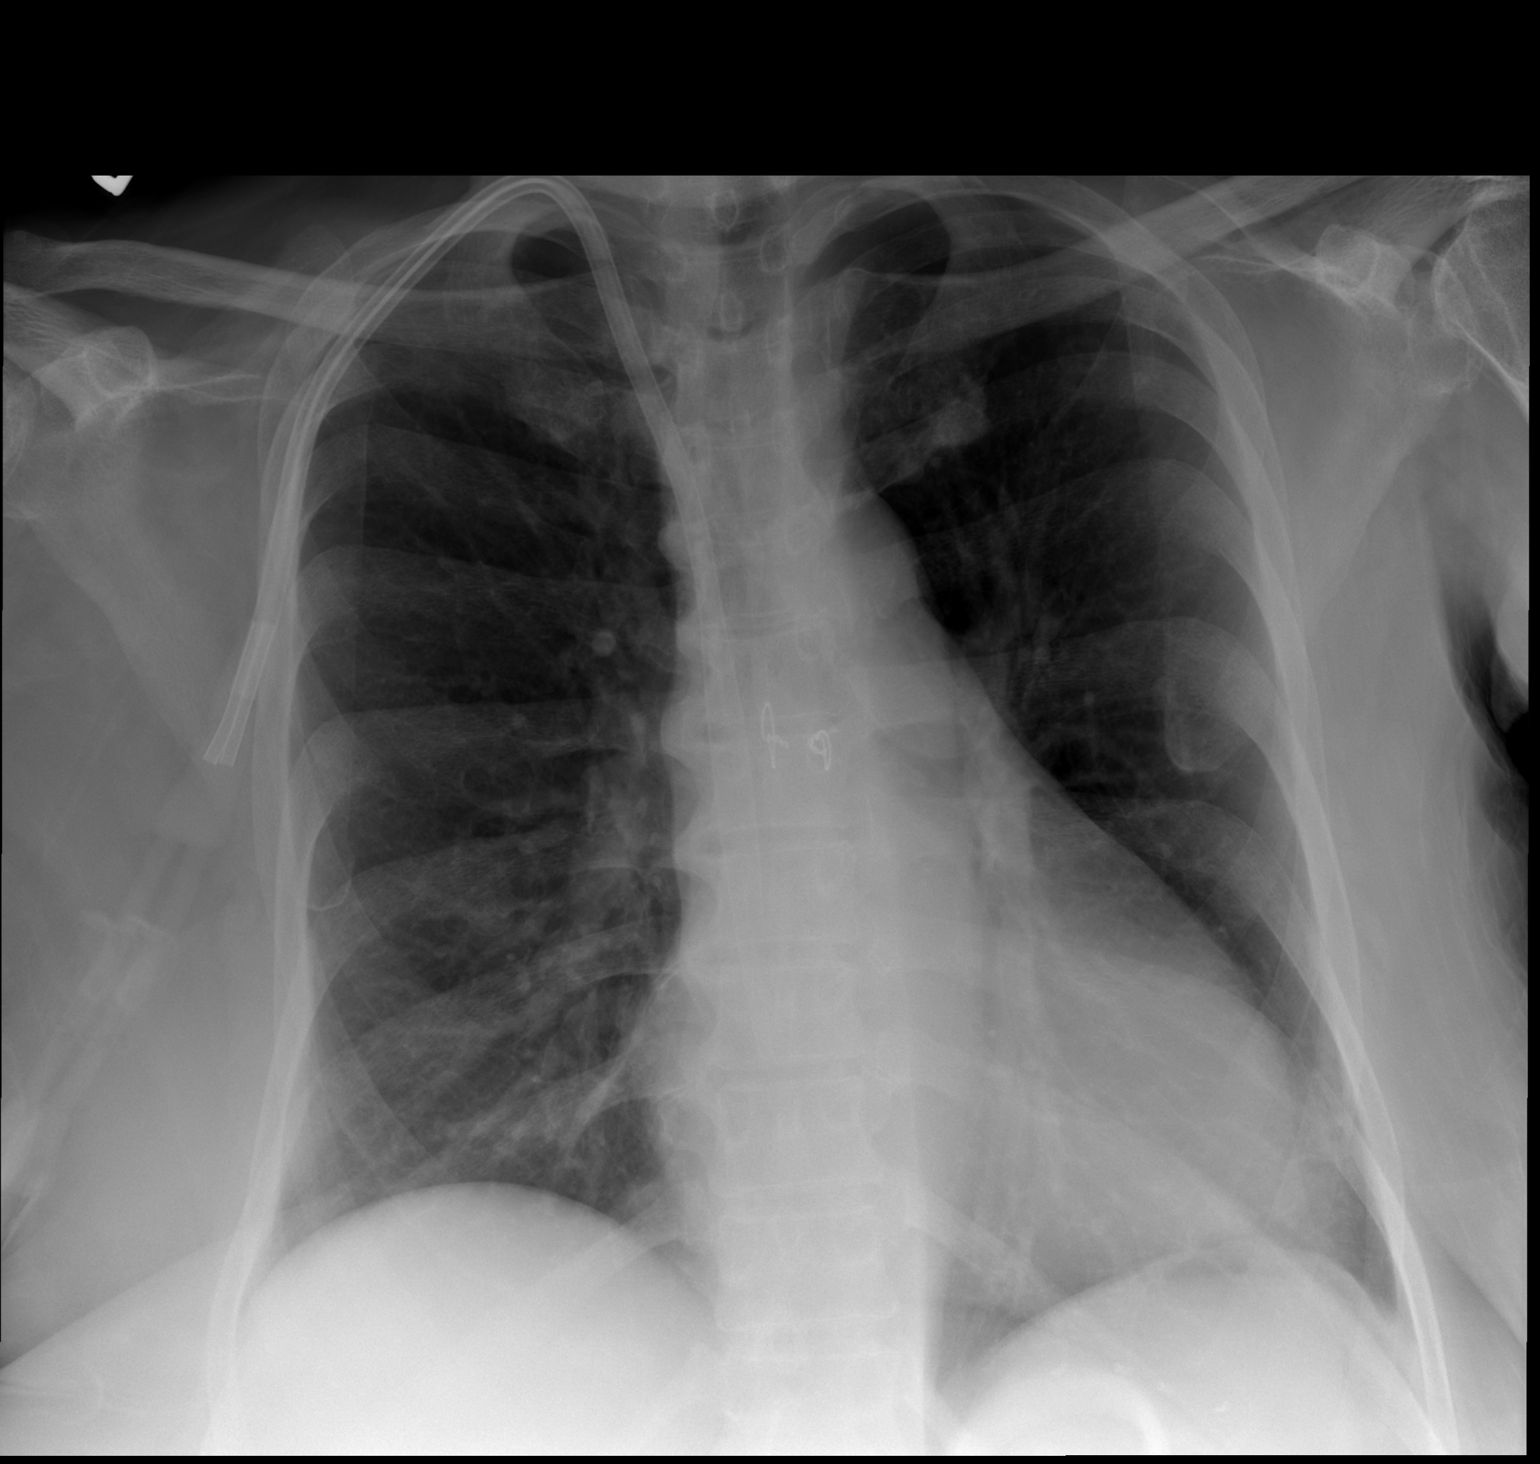

[2 of 2 positions shown; findings below may reference images not displayed]

FINDINGS: Right-sided dialysis catheter remains unchanged in
position.  Surgical wires projected over the mid sternum.  Cardiac
enlargement with normal pulmonary vascularity.  No focal airspace
consolidation in the lungs.  No blunting of costophrenic angles.
No pneumothorax.  Calcified granulomas in the left upper lung.
Degenerative changes in the spine.  No significant change since
previous study.
IMPRESSION: No evidence of active pulmonary disease.  Cardiac enlargement.

## 2011-12-08 IMAGING — US IR IVC FILTER PLMT / S&I /IMG GUID/MOD SED
1 series · 1 of 1 positions shown · non-contrast
Comparison: none

Clinical Data/Indication:  HISTORY OF INTRACRANIAL HEMORRHAGE.
DVT.

IR IVC FILTER PLACEMENT/ S+I/ IMAGE GUIDE MODERATE SEDATION
Sedation: Versed 1.5 mg, Fentanyl 50 mcg.
Total Moderate Sedation Time: Seven minutes.
Contrast Volume: Carbon dioxide.
Fluoroscopy Time:  minutes.
Procedure: The procedure, risks, benefits, and alternatives were
explained to the patient. Questions regarding the procedure were
encouraged and answered. The patient understands and consents to
the procedure.
The right neck was prepped with Betadine in a sterile fashion, and
a sterile drape was applied covering the operative field. A sterile
gown and sterile gloves were used for the procedure.
The right internal jugular vein was noted to be patent initially
with ultrasound.  Under sonographic guidance, a micropuncture
needle was inserted into the right internal jugular vein
(Ultrasound image documentation was performed). It was removed over
an 018 wire which was upsized to GOCHEVA.  The sheath was inserted
over the wire and into the IVC.  IVC venography with carbon dioxide
was performed.
The temporary filter was then deployed in the infrarenal IVC.  The
sheath was removed and hemostasis was achieved with direct
pressure.

[Series 1: ir ivc filter plmt / s&i /img guid/mod sed · 1 of 1 slices shown]
[im 1/1]
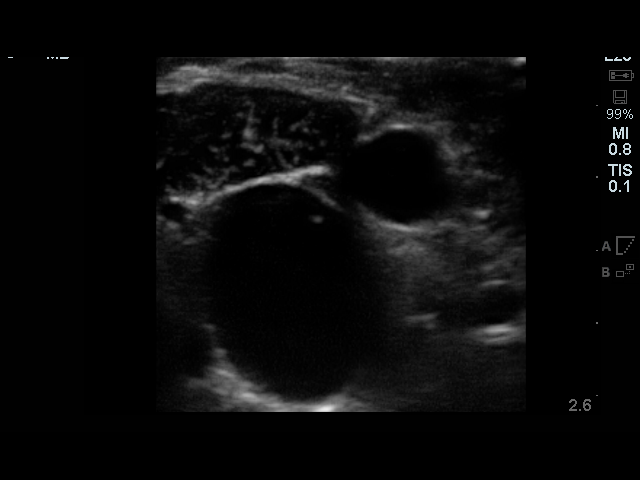

[1 of 1 positions shown; findings below may reference images not displayed]

FINDINGS: Carbon dioxide venography demonstrates patency of the IVC
and renal vein inflow at the L1-2 disc.  The image demonstrates
placement of an IVC filter with its tip at the L1-2 disc.

Complications: None.
IMPRESSION: Successful infrarenal IVC filter placement.  This is a temporary
filter.  It can be removed or remain in place to become permanent.

## 2011-12-08 IMAGING — XA IR IVC FILTER PLMT / S&I /IMG GUID/MOD SED
1 series · 8 of 8 positions shown · non-contrast
Comparison: none

Clinical Data/Indication:  HISTORY OF INTRACRANIAL HEMORRHAGE.
DVT.

IR IVC FILTER PLACEMENT/ S+I/ IMAGE GUIDE MODERATE SEDATION
Sedation: Versed 1.5 mg, Fentanyl 50 mcg.
Total Moderate Sedation Time: Seven minutes.
Contrast Volume: Carbon dioxide.
Fluoroscopy Time:  minutes.
Procedure: The procedure, risks, benefits, and alternatives were
explained to the patient. Questions regarding the procedure were
encouraged and answered. The patient understands and consents to
the procedure.
The right neck was prepped with Betadine in a sterile fashion, and
a sterile drape was applied covering the operative field. A sterile
gown and sterile gloves were used for the procedure.
The right internal jugular vein was noted to be patent initially
with ultrasound.  Under sonographic guidance, a micropuncture
needle was inserted into the right internal jugular vein
(Ultrasound image documentation was performed). It was removed over
an 018 wire which was upsized to GOCHEVA.  The sheath was inserted
over the wire and into the IVC.  IVC venography with carbon dioxide
was performed.
The temporary filter was then deployed in the infrarenal IVC.  The
sheath was removed and hemostasis was achieved with direct
pressure.

[Series 1: run · 8 of 8 slices shown]
[im 1/8]
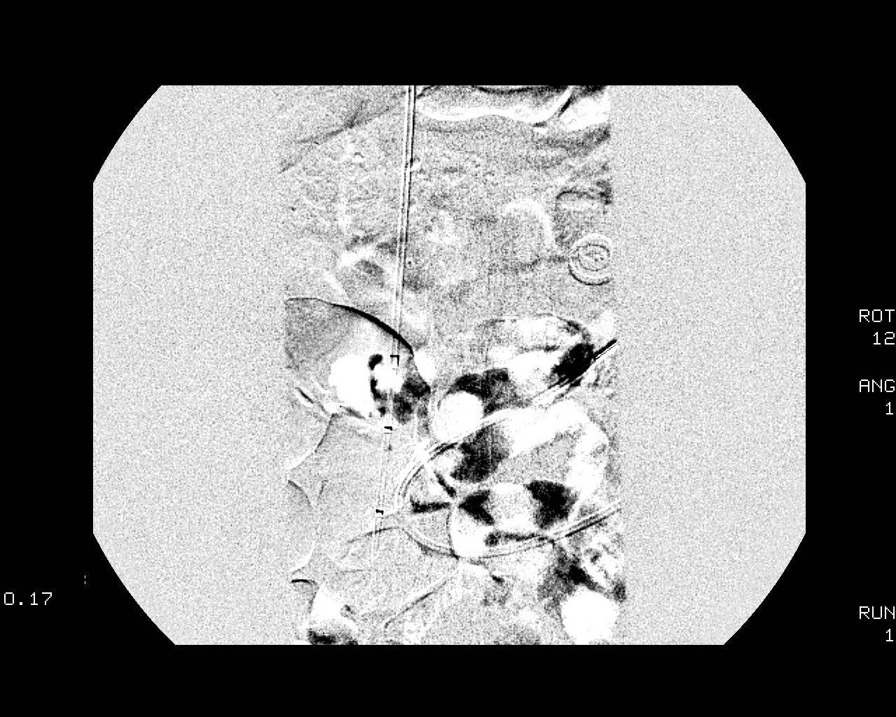
[im 2/8]
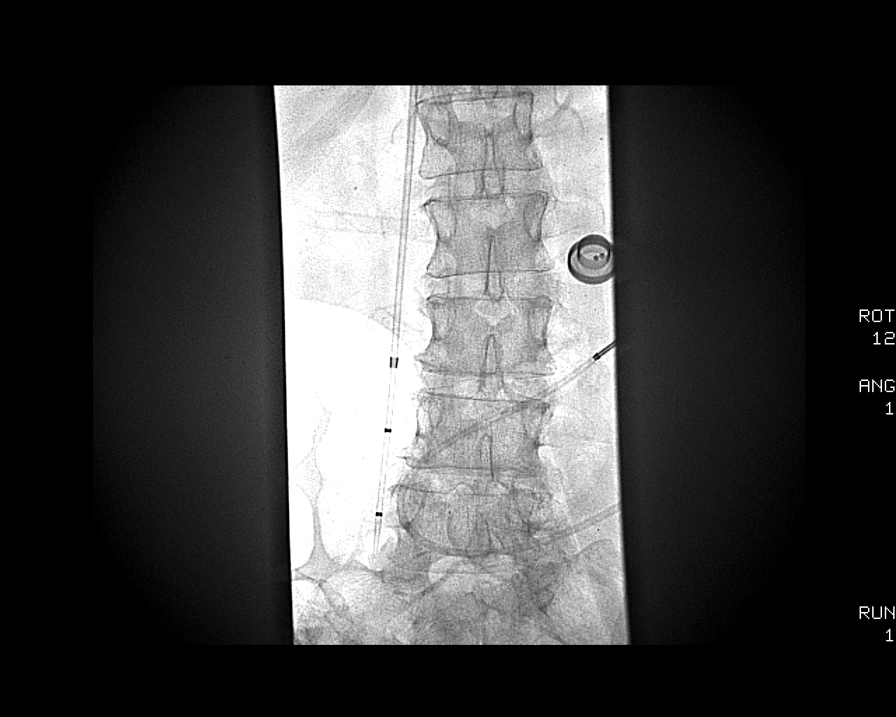
[im 3/8]
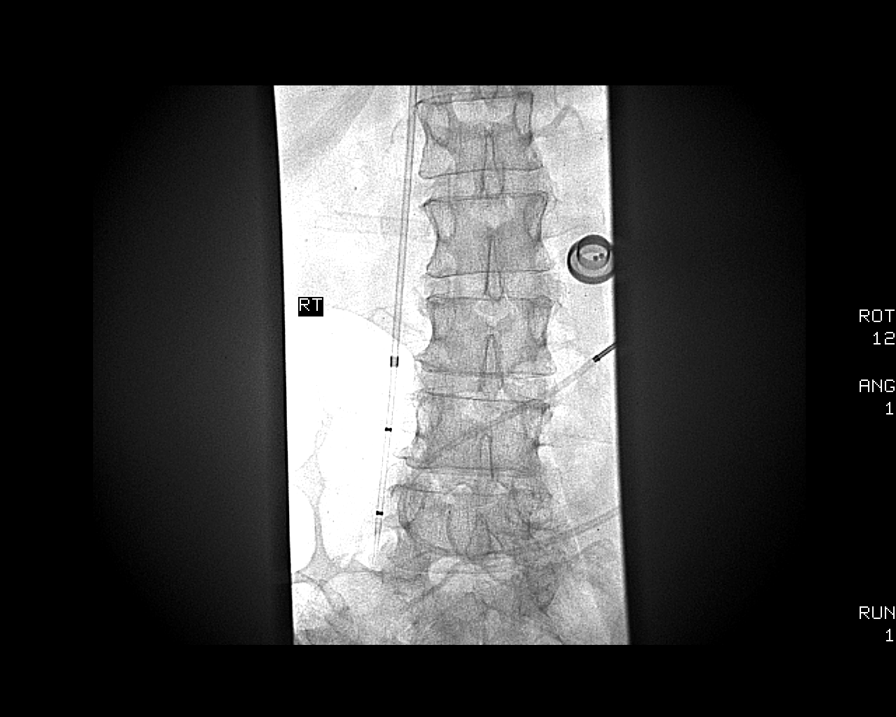
[im 4/8]
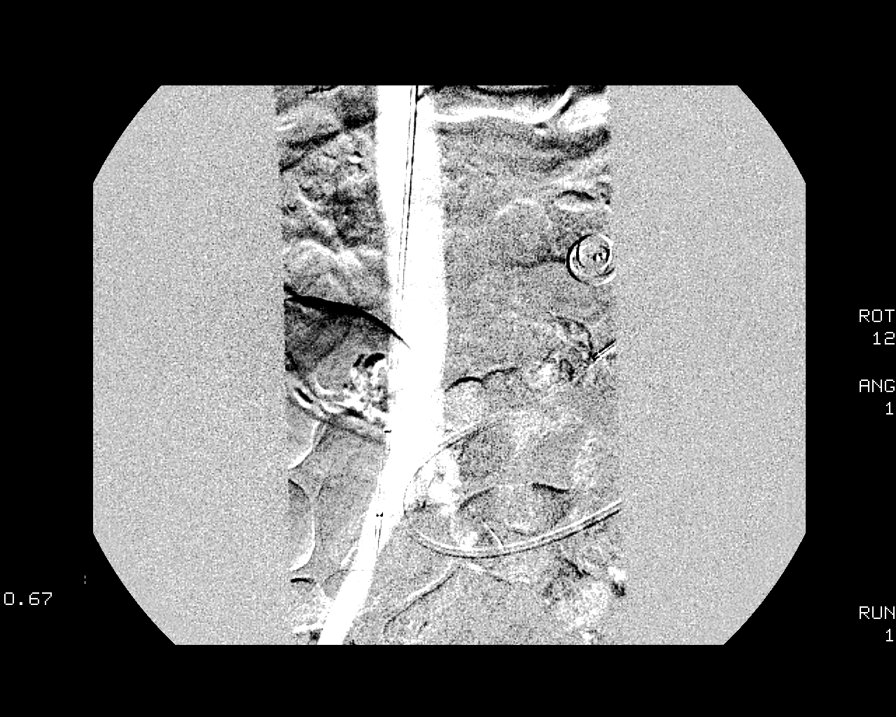
[im 5/8]
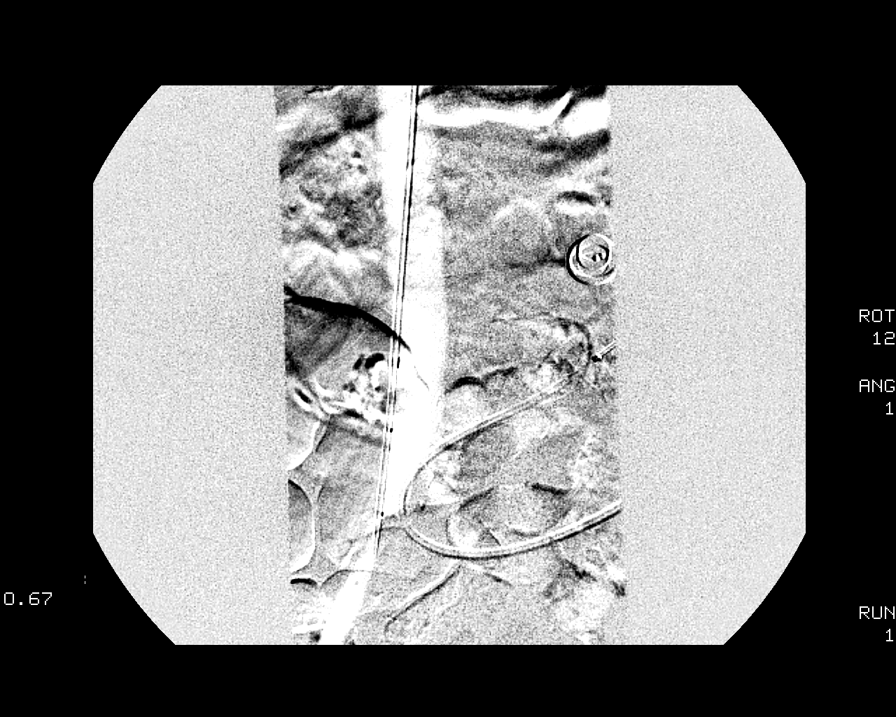
[im 6/8]
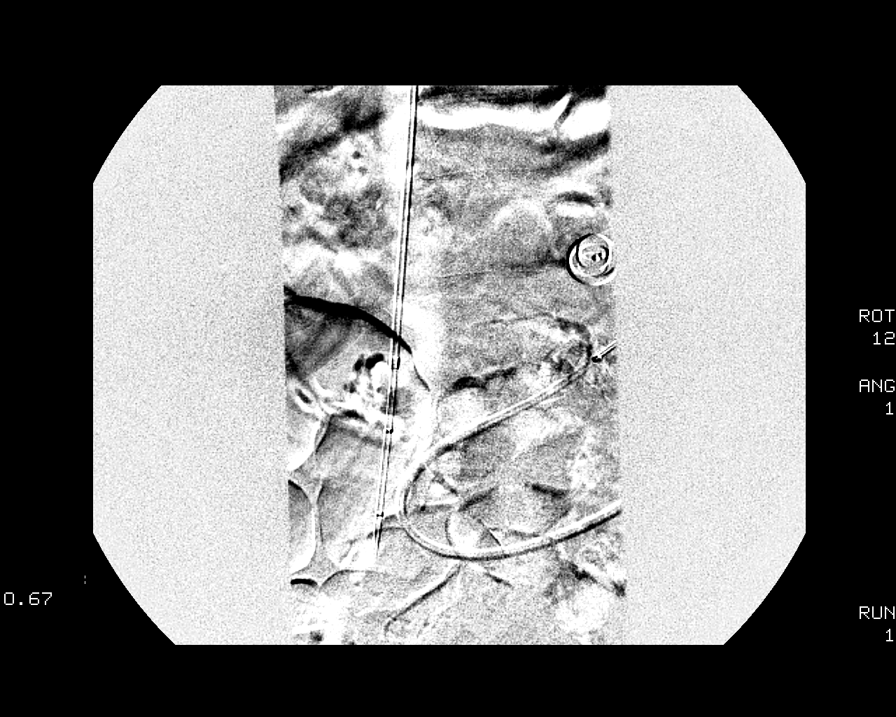
[im 7/8]
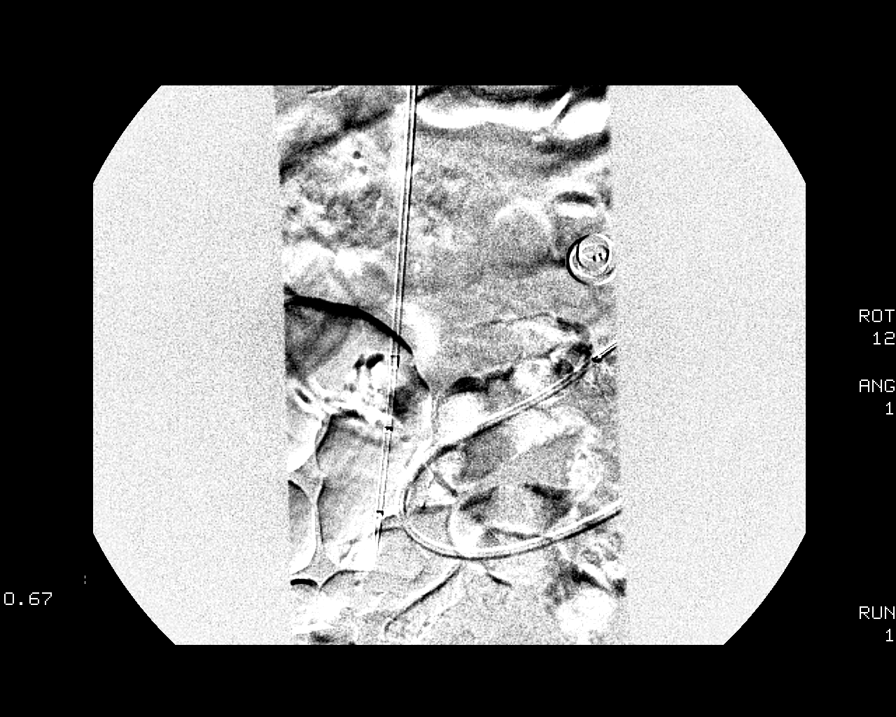
[im 8/8]
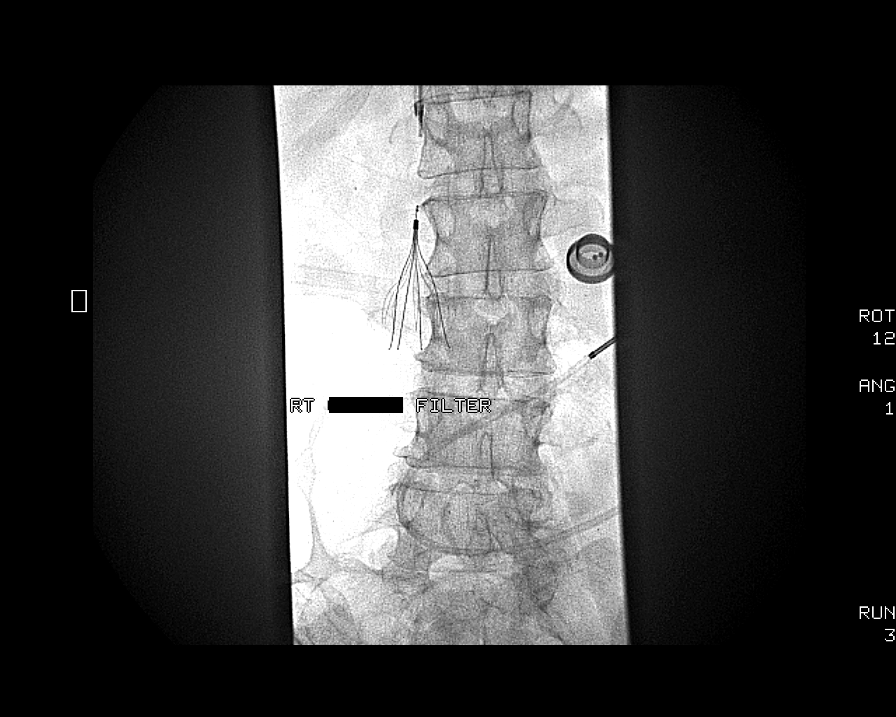

[8 of 8 positions shown; findings below may reference images not displayed]

FINDINGS: Carbon dioxide venography demonstrates patency of the IVC
and renal vein inflow at the L1-2 disc.  The image demonstrates
placement of an IVC filter with its tip at the L1-2 disc.

Complications: None.
IMPRESSION: Successful infrarenal IVC filter placement.  This is a temporary
filter.  It can be removed or remain in place to become permanent.

## 2011-12-08 MED ORDER — DEXTROSE 5 % IV SOLN
1.0000 g | Freq: Once | INTRAVENOUS | Status: AC
  Administered 2011-12-08: 1 g via INTRAVENOUS
  Filled 2011-12-08: qty 10

## 2011-12-08 MED ORDER — CHLORHEXIDINE GLUCONATE CLOTH 2 % EX PADS
6.0000 | MEDICATED_PAD | Freq: Every day | CUTANEOUS | Status: DC
  Administered 2011-12-08: 6 via TOPICAL
  Administered 2011-12-09: 12 via TOPICAL

## 2011-12-08 MED ORDER — HEPARIN (PORCINE) IN NACL 100-0.45 UNIT/ML-% IJ SOLN
1250.0000 [IU]/h | INTRAMUSCULAR | Status: DC
  Filled 2011-12-08: qty 250

## 2011-12-08 MED ORDER — SOTALOL HCL 80 MG PO TABS
80.0000 mg | ORAL_TABLET | Freq: Two times a day (BID) | ORAL | Status: DC
  Administered 2011-12-08 – 2011-12-09 (×3): 80 mg via ORAL
  Filled 2011-12-08 (×4): qty 1

## 2011-12-08 MED ORDER — CEFAZOLIN SODIUM 1-5 GM-% IV SOLN
1.0000 g | INTRAVENOUS | Status: DC
  Filled 2011-12-08: qty 50

## 2011-12-08 MED ORDER — MIDAZOLAM HCL 5 MG/5ML IJ SOLN
INTRAMUSCULAR | Status: DC | PRN
  Administered 2011-12-08: 1.5 mg via INTRAVENOUS

## 2011-12-08 MED ORDER — MIDAZOLAM HCL 2 MG/2ML IJ SOLN
INTRAMUSCULAR | Status: AC
  Filled 2011-12-08: qty 2

## 2011-12-08 MED ORDER — SERTRALINE HCL 50 MG PO TABS
50.0000 mg | ORAL_TABLET | Freq: Every morning | ORAL | Status: DC
  Administered 2011-12-08 – 2011-12-09 (×2): 50 mg via ORAL
  Filled 2011-12-08 (×2): qty 1

## 2011-12-08 MED ORDER — HEPARIN SODIUM (PORCINE) 5000 UNIT/ML IJ SOLN
5000.0000 [IU] | Freq: Three times a day (TID) | INTRAMUSCULAR | Status: DC
  Administered 2011-12-08 – 2011-12-09 (×2): 5000 [IU] via SUBCUTANEOUS
  Filled 2011-12-08 (×8): qty 1

## 2011-12-08 MED ORDER — SODIUM CHLORIDE 0.9 % IV BOLUS (SEPSIS)
250.0000 mL | Freq: Once | INTRAVENOUS | Status: AC
  Administered 2011-12-08: 250 mL via INTRAVENOUS

## 2011-12-08 MED ORDER — PRO-STAT SUGAR FREE PO LIQD
30.0000 mL | Freq: Two times a day (BID) | ORAL | Status: DC
  Administered 2011-12-08 – 2011-12-09 (×2): 30 mL via ORAL
  Filled 2011-12-08 (×4): qty 30

## 2011-12-08 MED ORDER — ONDANSETRON HCL 4 MG PO TABS: 4.0000 mg | ORAL_TABLET | Freq: Four times a day (QID) | ORAL | Status: DC | PRN

## 2011-12-08 MED ORDER — MUPIROCIN 2 % EX OINT
1.0000 "application " | TOPICAL_OINTMENT | Freq: Two times a day (BID) | CUTANEOUS | Status: DC
  Administered 2011-12-08 – 2011-12-09 (×3): 1 via NASAL
  Filled 2011-12-08 (×2): qty 22

## 2011-12-08 MED ORDER — ONDANSETRON HCL 4 MG/2ML IJ SOLN: 4.0000 mg | Freq: Four times a day (QID) | INTRAMUSCULAR | Status: DC | PRN

## 2011-12-08 MED ORDER — ACETAMINOPHEN 325 MG PO TABS
650.0000 mg | ORAL_TABLET | Freq: Four times a day (QID) | ORAL | Status: DC | PRN
  Administered 2011-12-08: 650 mg via ORAL
  Filled 2011-12-08: qty 2

## 2011-12-08 MED ORDER — FENTANYL CITRATE 0.05 MG/ML IJ SOLN
INTRAMUSCULAR | Status: AC
  Filled 2011-12-08: qty 2

## 2011-12-08 MED ORDER — ACETAMINOPHEN 650 MG RE SUPP: 650.0000 mg | Freq: Four times a day (QID) | RECTAL | Status: DC | PRN

## 2011-12-08 MED ORDER — FENTANYL CITRATE 0.05 MG/ML IJ SOLN
INTRAMUSCULAR | Status: DC | PRN
  Administered 2011-12-08: 50 ug via INTRAVENOUS

## 2011-12-08 MED ORDER — DIPHENHYDRAMINE HCL 25 MG PO CAPS: 25.0000 mg | ORAL_CAPSULE | Freq: Four times a day (QID) | ORAL | Status: DC | PRN

## 2011-12-08 MED ORDER — CALCIUM ACETATE 667 MG PO CAPS
667.0000 mg | ORAL_CAPSULE | Freq: Two times a day (BID) | ORAL | Status: DC
  Administered 2011-12-08: 667 mg via ORAL
  Filled 2011-12-08 (×5): qty 1

## 2011-12-08 MED ORDER — CHLORHEXIDINE GLUCONATE 4 % EX LIQD
60.0000 mL | Freq: Once | CUTANEOUS | Status: AC
  Administered 2011-12-09: 4 via TOPICAL
  Filled 2011-12-08: qty 60

## 2011-12-08 MED ORDER — HYDROCODONE-ACETAMINOPHEN 7.5-325 MG PO TABS
1.0000 | ORAL_TABLET | ORAL | Status: DC | PRN
  Administered 2011-12-08 – 2011-12-09 (×3): 1 via ORAL
  Filled 2011-12-08 (×3): qty 1

## 2011-12-08 MED ORDER — POLYETHYLENE GLYCOL 3350 17 G PO PACK
17.0000 g | PACK | Freq: Two times a day (BID) | ORAL | Status: DC
  Administered 2011-12-08 – 2011-12-09 (×2): 17 g via ORAL
  Filled 2011-12-08 (×4): qty 1

## 2011-12-08 MED ORDER — DILTIAZEM HCL ER COATED BEADS 240 MG PO CP24
240.0000 mg | ORAL_CAPSULE | Freq: Every day | ORAL | Status: DC
  Administered 2011-12-09: 240 mg via ORAL
  Filled 2011-12-08 (×2): qty 1

## 2011-12-08 MED ORDER — CIPROFLOXACIN HCL 500 MG PO TABS
500.0000 mg | ORAL_TABLET | Freq: Every day | ORAL | Status: DC
  Administered 2011-12-08: 500 mg via ORAL
  Filled 2011-12-08 (×2): qty 1

## 2011-12-08 MED ORDER — ALUM & MAG HYDROXIDE-SIMETH 200-200-20 MG/5ML PO SUSP
30.0000 mL | Freq: Four times a day (QID) | ORAL | Status: DC | PRN
  Filled 2011-12-08: qty 30

## 2011-12-08 MED ORDER — SODIUM CHLORIDE 0.9 % IJ SOLN: 3.0000 mL | INTRAMUSCULAR | Status: DC | PRN

## 2011-12-08 NOTE — Consult Note (Signed)
WOC consult Note Reason for Consult: Chronic, non-healing surgical wound on right hip.  Patient well known to me/our department from previous admissions. Wound type: Surgical Pressure Ulcer POA: No Measurement: 2cm x .5cm x 3.0cm  Wound bed: clean, pink, moist Drainage (amount, consistency, odor) minimal amount of dried exudate on old dressing Periwound: intact Dressing procedure/placement/frequency: twice daily cleansing and filling of defect with NS moistened gauze until planned surgical procedure by Dr. Alvan Dame on June 7th.   I will not follow.  Please re-consult if needed. Thanks, Maudie Flakes, MSN, RN, [facility], [place] [phone])

## 2011-12-08 NOTE — ED Provider Notes (Cosign Needed Addendum)
History     CSN: [id number]  Arrival date & time 12/07/11  2307   First MD Initiated Contact with Patient 12/07/11 2327      Chief Complaint  Patient presents with  . DVT    (Consider location/radiation/quality/duration/timing/severity/associated sxs/prior treatment) The history is provided by the patient and the spouse.   the patient is a 66 year old bedridden female, who presents emergency department complaining of a DVT in her left lower extremity.  She said an ultrasound was performed at her nursing home and it was positive for DVT.  She is not on any anticoagulants.  She denies chest pain, or shortness of breath, or fluttering of her heart.  She denies pain in her lower extremity.  She denies fevers, or chills.  She gets dialysis.  She says she is unable to take Coumadin because she had had hemorrhagic stroke while on Coumadin.  In the past  Past Medical History  Diagnosis Date  . Hypertension   . Depression   . Fractures, stress     in both feet  . 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 Stroke   . Gout   . Asthma     MILD NOW-NO INHALERS  . Sleep apnea     USES CPAP  . Anemia   . Blood transfusion   . Arthritis     HIP  . Atrial fibrillation     PT TAKES ASPIRIN AS BLOOD THINNER-HX OF CEREBRAL BLEED WHILE ON COUMADIN  . Arrhythmia   . Cardiomyopathy   . Hypercalcemia     09/10/11  . HCAP (healthcare-associated pneumonia)     09/10/11  . Kidney disease     [facility]    Past Surgical History  Procedure Date  . Cholecystectomy   . 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 Joint replacement     right hip and right femur x 2  . Right femur fracture x2     FRACTURES WERE AFTER AFTER RIGHT HIP REPLACEMENT  . I&d extremity 09/15/2011    Procedure: IRRIGATION AND DEBRIDEMENT EXTREMITY;  Surgeon: [name], MD;  Location: [place];  Service: Orthopedics;  Laterality: Right;  . Insertion of dialysis catheter 09/28/2011    Procedure:  INSERTION OF DIALYSIS CATHETER;  Surgeon: Rosetta Posner, MD;  Location: [place];  Service: Vascular;  Laterality: Right;  . Av fistula placement 09/28/2011    Procedure: ARTERIOVENOUS (AV) FISTULA CREATION;  Surgeon: Rosetta Posner, MD;  Location: [facility] OR;  Service: Vascular;  Laterality: Left;    Family History  Problem Relation Age of Onset  . Diabetes Mother   . Hypertension Mother   . Hodgkin's lymphoma  32    dscd---HODGKINS DISEASE  . Cancer Father     History  Substance Use Topics  . Smoking status: Never Smoker   . Smokeless tobacco: Never Used  . Alcohol Use: No    OB History    Grav Para Term Preterm Abortions TAB SAB Ect Mult Living                  Review of Systems  Constitutional: Negative for chills.  Respiratory: Negative for cough, chest tightness and shortness of breath.   Cardiovascular: Positive for leg swelling. Negative for chest pain and palpitations.  Gastrointestinal: Negative for nausea and vomiting.  Skin: Negative for rash.  Neurological: Negative for headaches.  Psychiatric/Behavioral: Negative for confusion.  All other systems reviewed  and are negative.    Allergies  Warfarin sodium  Home Medications   Current Outpatient Rx  Name Route Sig Dispense Refill  . CALCIUM ACETATE 667 MG PO CAPS Oral Take 667 mg by mouth 2 (two) times daily before a meal.     . CIPROFLOXACIN HCL 500 MG PO TABS Oral Take 500 mg by mouth daily at 6 PM.    . DILTIAZEM HCL ER COATED BEADS 240 MG PO CP24 Oral Take 240 mg by mouth daily.    Marland Kitchen DIPHENHYDRAMINE HCL 25 MG PO TABS Oral Take 25 mg by mouth every 6 (six) hours as needed. Itching/rash    . PRO-STAT 64 PO LIQD Oral Take 30 mLs by mouth 2 (two) times daily.    Marland Kitchen HYDROCODONE-ACETAMINOPHEN 7.5-325 MG PO TABS Oral Take 1 tablet by mouth every 4 (four) hours as needed. For pain    . METOPROLOL TARTRATE 50 MG PO TABS Oral Take 50 mg by mouth 2 (two) times daily. Hold on mornings of dialysis (M,W,F)    . THERA M PLUS PO  TABS Oral Take 1 tablet by mouth 2 (two) times daily.     Marland Kitchen NEPRO PO Oral Take 1 Can by mouth daily.    Marland Kitchen POLYETHYLENE GLYCOL 3350 PO PACK Oral Take 17 g by mouth 2 (two) times daily.    . SERTRALINE HCL 50 MG PO TABS Oral Take 50 mg by mouth every morning. Take before breakfast    . SOTALOL HCL 80 MG PO TABS Oral Take 80 mg by mouth 2 (two) times daily. Takes at 1000 & 2200      BP 92/55  Pulse 94  Temp(Src) 98.4 F (36.9 C) (Oral)  Resp 19  SpO2 100%  Physical Exam  Nursing note and vitals reviewed. Constitutional: She is oriented to person, place, and time. No distress.       Morbidly obese  HENT:  Head: Normocephalic and atraumatic.  Eyes: Pupils are equal, round, and reactive to light.  Neck: Normal range of motion. Neck supple.  Cardiovascular: Normal rate.   No murmur heard. Pulmonary/Chest: Effort normal. She has no rales.  Abdominal: Soft. She exhibits no distension.  Musculoskeletal: She exhibits edema. She exhibits no tenderness.       Left calf is twice the size of the right calf.  There is no tenderness  Neurological: She is alert and oriented to person, place, and time.  Skin: Skin is warm and dry.  Psychiatric: She has a normal mood and affect. Thought content normal.    ED Course  Procedures (including critical care time) DVT in bedridden female, with history of hemorrhagic stroke after using Coumadin.  She also gets dialysis.  We will check her renal function and then placed on an anticoagulant to mitigate the progression of the blood clot.  There is no evidence of any pulmonary embolism.  At this time   [place]   No results found.   No diagnosis found.  I spoke with pharmacist.  Alveda Reasons is contraindicated in renal failure. Options for tx lovenox and ivc filter I spoke with intensivist.  Since pt is bedridden, risk factor for dvt is permanent.  Therefore, lovenox not option.  Only ivc filter I spoke with hospitalist. She  will come admit.   MDM  Left leg DVT No evidence of pe        Barbara Cower, MD 12/08/11 [id number]  Barbara Cower, MD 12/08/11 [phone]

## 2011-12-08 NOTE — Consult Note (Signed)
[facility] Renal Consultation Note  Indication for Consultation:  Management of ESRD/hemodialysis; anemia, hypertension/volume and secondary hyperparathyroidism  HPI: [name] is a 66 y.o. female with ESRD on HD on MWF at [facility] who resides at [facility] for rehabilitation after removal of an infected right hip prosthesis in February and presented to the ED this AM with pain and swelling in the left lower extremity, found to be secondary to DVT per ultrasound.  She was previously on Coumadin for atrial fibrillation, but had a hemorrhagic CVA in 2009.  Therefore, Radiology was consulted for likely IVC filter placement today.  The patient began hemodialysis in February.   Dialysis Orders: Center: [facility] on MWF . EDW 99.5 kg   HD Bath 3K/2.25Ca  Time 4 hrs   Heparin 10,000 U. Access Right IJ catheter, maturing AVF @ LUA   BFR 400 DFR 800    Zemplar 0 mcg IV/HD   Epogen 0 Units IV/HD  Venofer 0  Past Medical History  Diagnosis Date  . Hypertension   . Depression   . Fractures, stress     in both feet  . 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 Stroke   . Gout   . Asthma     MILD NOW-NO INHALERS  . Sleep apnea     USES CPAP  . Anemia   . Blood transfusion   . Arthritis     HIP  . Atrial fibrillation     PT TAKES ASPIRIN AS BLOOD THINNER-HX OF CEREBRAL BLEED WHILE ON COUMADIN  . Arrhythmia   . Cardiomyopathy   . Hypercalcemia     09/10/11  . HCAP (healthcare-associated pneumonia)     09/10/11  . Kidney disease     [facility]  . CHF (congestive heart failure)    Past Surgical History  Procedure Date  . Cholecystectomy   . Cardiac valve surgery     AGE 54 FOR ATRIAL SEPTAL DEFECT  . Joint replacement     right hip and right femur x 2  . Right femur fracture x2     FRACTURES WERE AFTER AFTER RIGHT HIP REPLACEMENT  . I&d extremity 09/15/2011    Procedure: IRRIGATION AND DEBRIDEMENT EXTREMITY;  Surgeon: [name], MD;  Location: [place];   Service: Orthopedics;  Laterality: Right;  . Insertion of dialysis catheter 09/28/2011    Procedure: INSERTION OF DIALYSIS CATHETER;  Surgeon: Rosetta Posner, MD;  Location: [place];  Service: Vascular;  Laterality: Right;  . Av fistula placement 09/28/2011    Procedure: ARTERIOVENOUS (AV) FISTULA CREATION;  Surgeon: Rosetta Posner, MD;  Location: [facility] OR;  Service: Vascular;  Laterality: Left;   Family History  Problem Relation Age of Onset  . Diabetes Mother   . Hypertension Mother   . Hodgkin's lymphoma  32    dscd---HODGKINS DISEASE  . Cancer Father    Social History She reports that she has quit smoking and has never used smokeless tobacco. She reports that she does not drink alcohol or use illicit drugs.  She previously was a Environmental education officer at [employer].  Allergies  Allergen Reactions  . Warfarin Sodium Other (See Comments)    Caused her to have a stroke   Prior to Admission medications   Medication Sig Start Date End Date Taking? Authorizing Provider  calcium acetate (PHOSLO) 667 MG capsule Take 667 mg by mouth 2 (two) times daily before a meal.    Yes Historical Provider,  MD  ciprofloxacin (CIPRO) 500 MG tablet Take 500 mg by mouth daily at 6 PM.   Yes Historical Provider, MD  diltiazem (CARDIZEM CD) 240 MG 24 hr capsule Take 240 mg by mouth daily.   Yes Historical Provider, MD  diphenhydrAMINE (BENADRYL) 25 MG tablet Take 25 mg by mouth every 6 (six) hours as needed. Itching/rash   Yes Historical Provider, MD  feeding supplement (PRO-STAT SUGAR FREE 64) LIQD Take 30 mLs by mouth 2 (two) times daily.   Yes Historical Provider, MD  HYDROcodone-acetaminophen (NORCO) 7.5-325 MG per tablet Take 1 tablet by mouth every 4 (four) hours as needed. For pain   Yes Historical Provider, MD  metoprolol (LOPRESSOR) 50 MG tablet Take 50 mg by mouth 2 (two) times daily. Hold on mornings of dialysis (M,W,F) 10/01/11 09/30/12 Yes Ripudeep Krystal Eaton, MD  Multiple Vitamins-Minerals (MULTIVITAMINS THER. W/MINERALS)  TABS Take 1 tablet by mouth 2 (two) times daily.    Yes Historical Provider, MD  Nutritional Supplements (NEPRO PO) Take 1 Can by mouth daily.   Yes Historical Provider, MD  polyethylene glycol (MIRALAX / GLYCOLAX) packet Take 17 g by mouth 2 (two) times daily.   Yes Historical Provider, MD  sertraline (ZOLOFT) 50 MG tablet Take 50 mg by mouth every morning. Take before breakfast   Yes Historical Provider, MD  sotalol (BETAPACE) 80 MG tablet Take 80 mg by mouth 2 (two) times daily. Takes at [place]   Yes Historical Provider, MD   Labs:  Results for orders placed during the hospital encounter of 12/07/11 (from the past 48 hour(s))  BASIC METABOLIC PANEL     Status: Abnormal   Collection Time   12/08/11 12:38 AM      Component Value Range Comment   Sodium 134 (*) 135 - 145 (mEq/L)    Potassium 4.0  3.5 - 5.1 (mEq/L)    Chloride 96  96 - 112 (mEq/L)    CO2 25  19 - 32 (mEq/L)    Glucose, Bld 95  70 - 99 (mg/dL)    BUN 21  6 - 23 (mg/dL)    Creatinine, Ser 2.78 (*) 0.50 - 1.10 (mg/dL)    Calcium 9.5  8.4 - 10.5 (mg/dL)    GFR calc non Af Amer 17 (*) >90 (mL/min)    GFR calc Af Amer 20 (*) >90 (mL/min)   CBC     Status: Abnormal   Collection Time   12/08/11  4:41 AM      Component Value Range Comment   WBC 13.8 (*) 4.0 - 10.5 (K/uL)    RBC 4.73  3.87 - 5.11 (MIL/uL)    Hemoglobin 12.4  12.0 - 15.0 (g/dL)    HCT 39.0  36.0 - 46.0 (%)    MCV 82.5  78.0 - 100.0 (fL)    MCH 26.2  26.0 - 34.0 (pg)    MCHC 31.8  30.0 - 36.0 (g/dL)    RDW 16.0 (*) 11.5 - 15.5 (%)    Platelets 178  150 - 400 (K/uL)   SURGICAL PCR SCREEN     Status: Abnormal   Collection Time   12/08/11  7:24 AM      Component Value Range Comment   MRSA, PCR POSITIVE (*) NEGATIVE     Staphylococcus aureus POSITIVE (*) NEGATIVE    APTT     Status: Normal   Collection Time   12/08/11  9:09 AM      Component Value Range Comment  aPTT 34  24 - 37 (seconds)   BASIC METABOLIC PANEL     Status: Abnormal   Collection Time     12/08/11  9:09 AM      Component Value Range Comment   Sodium 135  135 - 145 (mEq/L)    Potassium 4.3  3.5 - 5.1 (mEq/L)    Chloride 95 (*) 96 - 112 (mEq/L)    CO2 26  19 - 32 (mEq/L)    Glucose, Bld 89  70 - 99 (mg/dL)    BUN 26 (*) 6 - 23 (mg/dL)    Creatinine, Ser 3.48 (*) 0.50 - 1.10 (mg/dL)    Calcium 9.8  8.4 - 10.5 (mg/dL)    GFR calc non Af Amer 13 (*) >90 (mL/min)    GFR calc Af Amer 15 (*) >90 (mL/min)   CBC     Status: Abnormal   Collection Time   12/08/11  9:09 AM      Component Value Range Comment   WBC 13.4 (*) 4.0 - 10.5 (K/uL)    RBC 4.79  3.87 - 5.11 (MIL/uL)    Hemoglobin 12.5  12.0 - 15.0 (g/dL)    HCT 39.6  36.0 - 46.0 (%)    MCV 82.7  78.0 - 100.0 (fL)    MCH 26.1  26.0 - 34.0 (pg)    MCHC 31.6  30.0 - 36.0 (g/dL)    RDW 16.0 (*) 11.5 - 15.5 (%)    Platelets 193  150 - 400 (K/uL)   DIFFERENTIAL     Status: Abnormal   Collection Time   12/08/11  9:09 AM      Component Value Range Comment   Neutrophils Relative 70  43 - 77 (%)    Neutro Abs 9.4 (*) 1.7 - 7.7 (K/uL)    Lymphocytes Relative 16  12 - 46 (%)    Lymphs Abs 2.1  0.7 - 4.0 (K/uL)    Monocytes Relative 12  3 - 12 (%)    Monocytes Absolute 1.6 (*) 0.1 - 1.0 (K/uL)    Eosinophils Relative 1  0 - 5 (%)    Eosinophils Absolute 0.1  0.0 - 0.7 (K/uL)    Basophils Relative 1  0 - 1 (%)    Basophils Absolute 0.1  0.0 - 0.1 (K/uL)   PROTIME-INR     Status: Normal   Collection Time   12/08/11  9:09 AM      Component Value Range Comment   Prothrombin Time 13.9  11.6 - 15.2 (seconds)    INR 1.05  0.00 - 1.49    TYPE AND SCREEN     Status: Normal   Collection Time   12/08/11  9:09 AM      Component Value Range Comment   ABO/RH(D) O POS      Antibody Screen NEG      Sample Expiration 12/11/2011     . Constitutional: negative for chills, fatigue, fevers and sweats Respiratory: negative for cough, dyspnea on exertion and wheezing Cardiovascular: negative for chest pain, dyspnea, orthopnea and  palpitations Gastrointestinal: negative for abdominal pain, diarrhea, nausea and vomiting Genitourinary:negative for dysuria and hematuria Musculoskeletal:negative for back pain and myalgias, nonambulatory Neurological: negative for dizziness, headaches and weakness  Physical Exam: Filed Vitals:   12/08/11 1406  BP: 106/57  Pulse:   Temp:   Resp: 19     General appearance: alert, cooperative and no distress Head: Normocephalic, without obvious abnormality, atraumatic Neck: no adenopathy, no carotid bruit,  no JVD and supple, symmetrical, trachea midline Resp: clear to auscultation bilaterally Cardio: regular rate and rhythm, S1, S2 normal, no murmur, click, rub or gallop GI: soft, non-tender; bowel sounds normal; no masses,  no organomegaly Extremities: lower extremity edema (L > R) Dialysis Access: right IJ catheter, AVF @ LUA maturing with + bruit   Assessment/Plan 1. Left lower extremity DVT - not a candidate for Coumadin, secondary to hemorrhagic CVA in 2009, while on med for A-fib; IVC filter placement per IR today. 2. ESRD -  HD on MWF at [facility], using HD catheter, AVF placed on 3/15 with superficialization on 4/29 (both by dr. Donnetta Hutching), K 4.3 stable.  Next HD on 5/27. 3. Hypertension/volume  - BP 106/57 on Diltiazem 240 mg qd (also Lopressor 50 mg bid as outpatient); chest x-ray negative. 4. Anemia  - Hgb 12.5 off Epogen. 5. Metabolic bone disease -  Ca 9.8, last P 5 on 4/24, off Zemplar, on Phoslo 1 with meals. 6. Nutrition - Last Alb 3.3. 7. History of atrial fibrillation - on Diltiazem, HR controlled. 8. History of right femur fx - history of pseudomonas sepsis secondary to infected joint hardware s/p removal 08/29/11 per Dr. Alvan Dame.  LYLES,CHARLES 12/08/2011, 3:13 PM   Attending Nephrologist: Roney Jaffe, MD  Patient seen and examined and agree with assessment and plan as above.  Kelly Splinter  MD [facility] [phone] pgr    [phone] cell 12/08/2011, 4:34  PM

## 2011-12-08 NOTE — ED Notes (Signed)
PT refusing blood draws.

## 2011-12-08 NOTE — ED Notes (Signed)
PT resting with family at bedside. Pulses on left foot still intact. No signs of distress. Call light within reach.

## 2011-12-08 NOTE — ED Notes (Signed)
Respiratory at bedside for arterial blood draw.

## 2011-12-08 NOTE — Plan of Care (Signed)
Problem: Phase I Progression Outcomes Goal: Initial discharge plan identified Outcome: Completed/Met Date Met:  12/08/11 Return to [facility] when medically cleared

## 2011-12-08 NOTE — Progress Notes (Addendum)
PATIENT DETAILS Name: MANIAH KRATOCHVIL Age: 66 y.o. Sex: female Date of Birth: 1946/05/23 Admit Date: 12/07/2011 [id number] Lowne, DO, DO  Subjective: Admitted with extensive Right leg DVT-leg was swollen for the past few days per patient  Objective: Vital signs in last 24 hours: Filed Vitals:   12/08/11 0428 12/08/11 0500 12/08/11 0542 12/08/11 0650  BP: 99/55 95/45 90/43  95/63  Pulse: 90 90 93 91  Temp:    97.4 F (36.3 C)  TempSrc:    Oral  Resp: 21 22 18 20   SpO2: 100% 100% 100%     Weight change:   There is no height or weight on file to calculate BMI.  Intake/Output from previous day: No intake or output data in the 24 hours ending 12/08/11 1114  PHYSICAL EXAM: Gen Exam: Awake and alert with clear speech.   Neck: Supple, No JVD.  Chest: B/L Clear.   CVS: S1 S2 Regular, no murmurs.  Abdomen: soft, BS +, non tender, non distended. Extremities: no edema, lower extremities warm to touch-Right thigh enlarged compared to the left thigh-but not tense or tender Neurologic: Non Focal.   Skin: No Rash.   Wounds: N/A.    CONSULTS:  None  LAB RESULTS: CBC  Lab 12/08/11 0909 12/08/11 0441  WBC 13.4* 13.8*  HGB 12.5 12.4  HCT 39.6 39.0  PLT 193 178  MCV 82.7 82.5  MCH 26.1 26.2  MCHC 31.6 31.8  RDW 16.0* 16.0*  LYMPHSABS 2.1 --  MONOABS 1.6* --  EOSABS 0.1 --  BASOSABS 0.1 --  BANDABS -- --    Chemistries   Lab 12/08/11 0909 12/08/11 0038  NA 135 134*  K 4.3 4.0  CL 95* 96  CO2 26 25  GLUCOSE 89 95  BUN 26* 21  CREATININE 3.48* 2.78*  CALCIUM 9.8 9.5  MG -- --    GFR The CrCl is unknown because both a height and weight (above a minimum accepted value) are required for this calculation.  Coagulation profile  Lab 12/08/11 0909  INR 1.05  PROTIME --    Cardiac Enzymes No results found for this basename: CK:3,CKMB:3,TROPONINI:3,MYOGLOBIN:3 in the last 168 hours  No components found with this basename: POCBNP:3 No results found for this  basename: DDIMER:2 in the last 72 hours No results found for this basename: HGBA1C:2 in the last 72 hours No results found for this basename: CHOL:2,HDL:2,LDLCALC:2,TRIG:2,CHOLHDL:2,LDLDIRECT:2 in the last 72 hours No results found for this basename: TSH,T4TOTAL,FREET3,T3FREE,THYROIDAB in the last 72 hours No results found for this basename: VITAMINB12:2,FOLATE:2,FERRITIN:2,TIBC:2,IRON:2,RETICCTPCT:2 in the last 72 hours No results found for this basename: LIPASE:2,AMYLASE:2 in the last 72 hours  Urine Studies No results found for this basename: UACOL:2,UAPR:2,USPG:2,UPH:2,UTP:2,UGL:2,UKET:2,UBIL:2,UHGB:2,UNIT:2,UROB:2,ULEU:2,UEPI:2,UWBC:2,URBC:2,UBAC:2,CAST:2,CRYS:2,UCOM:2,BILUA:2 in the last 72 hours  MICROBIOLOGY: Recent Results (from the past 240 hour(s))  SURGICAL PCR SCREEN     Status: Abnormal   Collection Time   12/08/11  7:24 AM      Component Value Range Status Comment   MRSA, PCR POSITIVE (*) NEGATIVE  Final    Staphylococcus aureus POSITIVE (*) NEGATIVE  Final     RADIOLOGY STUDIES/RESULTS: Dg Chest 2 View  12/08/2011  *RADIOLOGY REPORT*  Clinical Data: Deep venous thrombosis.  History of stroke, asthma, atrial fibrillation, and cardiomyopathy.  CHEST - 2 VIEW  Comparison: 09/28/2011  Findings: Right-sided dialysis catheter remains unchanged in position.  Surgical wires projected over the mid sternum.  Cardiac enlargement with normal pulmonary vascularity.  No focal airspace consolidation in the lungs.  No blunting of costophrenic  angles. No pneumothorax.  Calcified granulomas in the left upper lung. Degenerative changes in the spine.  No significant change since previous study.  IMPRESSION: No evidence of active pulmonary disease.  Cardiac enlargement.  Original Report Authenticated By: Neale Burly, M.D.    MEDICATIONS: Scheduled Meds:   . calcium acetate  667 mg Oral BID AC  .  ceFAZolin (ANCEF) IV  1 g Intravenous 60 min Pre-Op  . cefTRIAXone (ROCEPHIN)  IV  1 g  Intravenous Once  . chlorhexidine  60 mL Topical Once  . Chlorhexidine Gluconate Cloth  6 each Topical Q0600  . ciprofloxacin  500 mg Oral q1800  . diltiazem  240 mg Oral Daily  . feeding supplement  30 mL Oral BID  . mupirocin ointment  1 application Nasal BID  . polyethylene glycol  17 g Oral BID  . sertraline  50 mg Oral q morning - 10a  . sodium chloride  250 mL Intravenous Once  . sotalol  80 mg Oral BID   Continuous Infusions:   . DISCONTD: heparin     PRN Meds:.acetaminophen, acetaminophen, alum & mag hydroxide-simeth, diphenhydrAMINE, HYDROcodone-acetaminophen, ondansetron (ZOFRAN) IV, ondansetron, sodium chloride  Antibiotics: Anti-infectives     Start     Dose/Rate Route Frequency Ordered Stop   12/08/11 1800   ciprofloxacin (CIPRO) tablet 500 mg        500 mg Oral Daily-1800 12/08/11 0647     12/08/11 0705   ceFAZolin (ANCEF) IVPB 1 g/50 mL premix        1 g 100 mL/hr over 30 Minutes Intravenous 60 min pre-op 12/08/11 0705     12/08/11 0530   cefTRIAXone (ROCEPHIN) 1 g in dextrose 5 % 50 mL IVPB        1 g 100 mL/hr over 30 Minutes Intravenous  Once 12/08/11 0518 12/08/11 [id number]          Assessment/Plan: Patient Active Hospital Problem List: DVT (deep venous thrombosis) -difficult situation -had a significant Intra-cranial bleed in 2010-while on coumadin for Afib-INR was therapeutic when this occurred -now with large DVT -Discussed case-along with clinical history, radiologic/ultrasound findings with Dr Doy Mince (Neuro on call) and patient's prior primary Neurologist-Dr Sethi-both were of the opinion-that coumadin should not be used, and a IVC filter be placed.I also spoke with [facility]-Hem-Onc on call-who advises the same. Really no other option for anticoagulation-given the fact she is ESRD-other than coumadin.Long discussion with patient and her 2 daughters at bedside, various options including going back on coumadin or just placing a IVC filter discussed.  -I have  had a long discussion with the patient and 2 daughters at bedside, she also in the past has been advised by her MD's to-not to use coumadin ever, and given no clinical evidence of PE (no SOB/Chest pain)-she is agreeable to undergo IVC filter placement. I have explained to her in great detail that the IVC filter will not treat the DVT-but will only prevent to a certain extent a PE. She does have some swelling of her right extremity-however the leg is not tense to suggest any worrisome features, she has been told clearly that she probably will have long term sequale from this-chronic leg swelling-she understands this very well.Currently patient just chooses to get a IVC filter, and wants to avoid coumadin therapy.   ESRD -HD on M/W/F -Renal team notified  Afib -Sinus rhythm on EKG -not a coumadin candidate given history of ICH -continue with Cardizem and Sotalol  Mild Leukocytosis -likely from  ongoing right hip issues-perhaps DVT causing inflammation contributing as well -Check UA -continue with Cipro -if febrile will then escalate antibioitcs  Depression -continue with Zoloft  OSA -CPAP QHS  Morbid Obesity -have counseled extensively regarding importance of weight loss  HTN -BP soft -monitor while on cardizem and sotalol  Disposition: Remain inpatient  DVT Prophylaxis: Will start Prophylactic Lovenox-when IVC filter placed. However transfer to [id number] when bed available  Code Status: Full Code  Jonetta Osgood, MD. 12/08/2011, 11:14 AM

## 2011-12-08 NOTE — Progress Notes (Signed)
Report called to receiving nurse Wells Guiles, pt transferred to [id number] family at the bedside and also made aware. Pallavi, Kuzio [facility]

## 2011-12-08 NOTE — Progress Notes (Signed)
ANTICOAGULATION CONSULT NOTE - Initial Consult  Pharmacy Consult for Heparin Indication: LLE DVT  Allergies  Allergen Reactions  . Warfarin Sodium Other (See Comments)    Caused her to have a stroke    Patient Measurements:    Wt: 91.6 kg Ht: 65 inches IBW: 57 kg Heparin Dosing Weight: 77 kg  Vital Signs: Temp: 97.4 F (36.3 C) (05/25 0650) Temp src: Oral (05/25 0650) BP: 95/63 mmHg (05/25 0650) Pulse Rate: 91  (05/25 0650)  Labs:  Flo Shanks 12/08/11 0909 12/08/11 0441 12/08/11 0038  HGB 12.5 12.4 --  HCT 39.6 39.0 --  PLT 193 178 --  APTT 34 -- --  LABPROT 13.9 -- --  INR 1.05 -- --  HEPARINUNFRC -- -- --  CREATININE -- -- 2.78*  CKTOTAL -- -- --  CKMB -- -- --  TROPONINI -- -- --    The CrCl is unknown because both a height and weight (above a minimum accepted value) are required for this calculation.   Medical History: Past Medical History  Diagnosis Date  . Hypertension   . Depression   . Fractures, stress     in both feet  . 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 Stroke   . Gout   . Asthma     MILD NOW-NO INHALERS  . Sleep apnea     USES CPAP  . Anemia   . Blood transfusion   . Arthritis     HIP  . Atrial fibrillation     PT TAKES ASPIRIN AS BLOOD THINNER-HX OF CEREBRAL BLEED WHILE ON COUMADIN  . Arrhythmia   . Cardiomyopathy   . Hypercalcemia     09/10/11  . HCAP (healthcare-associated pneumonia)     09/10/11  . Kidney disease     [facility]  . CHF (congestive heart failure)     Assessment: 66 y.o. 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 She was admitted with swelling of the LLE and found to have a LLE DVT and is now to start heparin with no bolus (per MD order) and to target lower end of therapeutic range. Heparin dosing weight: 77kg. Baseline INR 1.05, plts 193, Hgb/Hct ok.   Goal of Therapy:  Heparin level 0.3-0.5 units/ml Monitor platelets by anticoagulation  protocol: Yes   Plan:  1. No heparin bolus (per MD order) 2. Initiate heparin drip at a rate of 1250 units/hr (12.5 ml/hr) 3. Daily heparin levels, CBC 4. Will continue to monitor for any signs/symptoms of bleeding and will follow up with heparin level in 8 hours   Alycia Rossetti, PharmD, BCPS Clinical Pharmacist Pager: [phone] 12/08/2011 10:47 AM

## 2011-12-08 NOTE — Interval H&P Note (Cosign Needed)
History and Physical Interval Note:  12/08/2011 11:34 AM  [name]  Has been admitted with bilateral LE DVT. She has a history of hemorrhagic CVA while on Coumadin in the past. She also has new onset ESRD and has recently started hemodialysis. She is currently using a (R)IJ permacath while her (L)UE AVF matures. Given the extensive DVT of both LE, request is made for IVC filter as the pt is at risk for PE. The various methods of treatment have been discussed with the patient and family. After consideration of risks, benefits and other options for treatment, the patient has consented to IVC filter placement.  We discussed retrievable vs. Permanent devices and we will likely place a retrievable device, but suspect it may end up being permanent. The patients' history has been reviewed, patient examined, no change in status, stable for procedure.  I have reviewed the patients' chart and labs.  Questions were answered to the patient's satisfaction.   BP 95/63  Pulse 91  Temp(Src) 97.4 F (36.3 C) (Oral)  Resp 20  SpO2 100% ENT: Unremarkable Neck: tunneled (R)IJ PC in place, no issues. Lungs: CTA without w/r/r Heart: RRR, no murmurs Abdomen:soft, ND Ext: (B)LE edema, (L)>(R)   Seen for and with Dr. Berlinda Last, [facility] PA-C

## 2011-12-08 NOTE — ED Notes (Signed)
Pt returns from xray

## 2011-12-08 NOTE — ED Notes (Signed)
Pt to xray with tech

## 2011-12-08 NOTE — H&P (Signed)
PCP:   Garnet Koyanagi, DO, DO  Nephrology: [facility] Orthopedics: Dr. Milagros Reap  Chief Complaint:  Swelling left lower extremity  HPI: This is a 66 year old female who resides at [facility] for rehabilitation. She then infected right hip prosthesis which is been removed. She's completed IV antibiotics and is currently being treated with by mouth Cipro and antibiotic releasing spacer. She is nonambulatory. Today she had pain in the left lower extremity that persisted all day. She noted swelling in the extremity. She was evaluated by the physician who ordered an ultrasound, which was positive for a left lower extremity DVT. She was sent to the ER. The patient denies any respiratory symptoms, no chest chest pains, no shortness of breath. She denies any cough, any burning on urination. She does make urine. She does have a history of hemorrhagic CVA in 2009. At that time she was on Coumadin for atrial fibrillation.  Review of Systems:Positives bolded  anorexia, fever, weight loss,, vision loss, decreased hearing, hoarseness, chest pain, syncope, dyspnea on exertion, peripheral edema, balance deficits, hemoptysis, abdominal pain, melena, hematochezia, severe indigestion/heartburn, hematuria, incontinence, genital sores, muscle weakness, suspicious skin lesions, transient blindness, difficulty walking, depression, unusual weight change, abnormal bleeding, enlarged lymph nodes, angioedema, and breast masses.  Past Medical History: Past Medical History  Diagnosis Date  . Hypertension   . Depression   . Fractures, stress     in both feet  . 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 Stroke   . Gout   . Asthma     MILD NOW-NO INHALERS  . Sleep apnea     USES CPAP  . Anemia   . Blood transfusion   . Arthritis     HIP  . Atrial fibrillation     PT TAKES ASPIRIN AS BLOOD THINNER-HX OF CEREBRAL BLEED WHILE ON COUMADIN  . Arrhythmia   . Cardiomyopathy   . Hypercalcemia     09/10/11  . HCAP  (healthcare-associated pneumonia)     09/10/11  . Kidney disease     [facility]   Past Surgical History  Procedure Date  . Cholecystectomy   . 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 Joint replacement     right hip and right femur x 2  . Right femur fracture x2     FRACTURES WERE AFTER AFTER RIGHT HIP REPLACEMENT  . I&d extremity 09/15/2011    Procedure: IRRIGATION AND DEBRIDEMENT EXTREMITY;  Surgeon: [name], MD;  Location: [place];  Service: Orthopedics;  Laterality: Right;  . Insertion of dialysis catheter 09/28/2011    Procedure: INSERTION OF DIALYSIS CATHETER;  Surgeon: Rosetta Posner, MD;  Location: [facility];  Service: Vascular;  Laterality: Right;  . Av fistula placement 09/28/2011    Procedure: ARTERIOVENOUS (AV) FISTULA CREATION;  Surgeon: Rosetta Posner, MD;  Location: [facility] OR;  Service: Vascular;  Laterality: Left;    Medications: Prior to Admission medications   Medication Sig Start Date End Date Taking? Authorizing Provider  calcium acetate (PHOSLO) 667 MG capsule Take 667 mg by mouth 2 (two) times daily before a meal.    Yes Historical Provider, MD  ciprofloxacin (CIPRO) 500 MG tablet Take 500 mg by mouth daily at 6 PM.   Yes Historical Provider, MD  diltiazem (CARDIZEM CD) 240 MG 24 hr capsule Take 240 mg by mouth daily.   Yes Historical Provider, MD  diphenhydrAMINE (BENADRYL) 25 MG tablet Take 25 mg by  mouth every 6 (six) hours as needed. Itching/rash   Yes Historical Provider, MD  feeding supplement (PRO-STAT SUGAR FREE 64) LIQD Take 30 mLs by mouth 2 (two) times daily.   Yes Historical Provider, MD  HYDROcodone-acetaminophen (NORCO) 7.5-325 MG per tablet Take 1 tablet by mouth every 4 (four) hours as needed. For pain   Yes Historical Provider, MD  metoprolol (LOPRESSOR) 50 MG tablet Take 50 mg by mouth 2 (two) times daily. Hold on mornings of dialysis (M,W,F) 10/01/11 09/30/12 Yes Ripudeep Krystal Eaton, MD  Multiple Vitamins-Minerals (MULTIVITAMINS THER.  W/MINERALS) TABS Take 1 tablet by mouth 2 (two) times daily.    Yes Historical Provider, MD  Nutritional Supplements (NEPRO PO) Take 1 Can by mouth daily.   Yes Historical Provider, MD  polyethylene glycol (MIRALAX / GLYCOLAX) packet Take 17 g by mouth 2 (two) times daily.   Yes Historical Provider, MD  sertraline (ZOLOFT) 50 MG tablet Take 50 mg by mouth every morning. Take before breakfast   Yes Historical Provider, MD  sotalol (BETAPACE) 80 MG tablet Take 80 mg by mouth 2 (two) times daily. Takes at [place]   Yes Historical Provider, MD    Allergies:   Allergies  Allergen Reactions  . Warfarin Sodium Other (See Comments)    Caused her to have a stroke    Social History:  reports that she has quit smoking. She has never used smokeless tobacco. She reports that she does not drink alcohol or use illicit drugs.  Family History: Family History  Problem Relation Age of Onset  . Diabetes Mother   . Hypertension Mother   . Hodgkin's lymphoma  32    dscd---HODGKINS DISEASE  . Cancer Father     Physical Exam: Filed Vitals:   12/08/11 0104 12/08/11 0200 12/08/11 0230 12/08/11 0350  BP: 92/57 93/50 100/55 93/51  Pulse: 91 90 90 91  Temp:      TempSrc:      Resp: 21 18 19 16   SpO2: 100% 100% 100% 100%    General:  Alert and oriented times three, morbidly obese, no acute distress Eyes: PERRLA, pink conjunctiva, no scleral icterus ENT: Moist oral mucosa, neck supple, no thyromegaly Lungs: clear to ascultation, no wheeze, no crackles, no use of accessory muscles Cardiovascular: regular rate and rhythm, no regurgitation, no gallops, no murmurs. No carotid bruits, no JVD Abdomen: soft, positive BS, non-tender, non-distended, no organomegaly, not an acute abdomen GU: not examined Neuro: CN II - XII grossly intact, sensation intact Musculoskeletal: strength 5/5 all extremities, no clubbing, cyanosis or right lower extremity edema, right hip with smalled pack surgical wounds Skin:  no rash, no subcutaneous crepitation, no decubitus Psych: appropriate patient   Labs on Admission:   Basename 12/08/11 0038  NA 134*  K 4.0  CL 96  CO2 25  GLUCOSE 95  BUN 21  CREATININE 2.78*  CALCIUM 9.5  MG --  PHOS --   No results found for this basename: AST:2,ALT:2,ALKPHOS:2,BILITOT:2,PROT:2,ALBUMIN:2 in the last 72 hours No results found for this basename: LIPASE:2,AMYLASE:2 in the last 72 hours No results found for this basename: WBC:2,NEUTROABS:2,HGB:2,HCT:2,MCV:2,PLT:2 in the last 72 hours No results found for this basename: CKTOTAL:3,CKMB:3,CKMBINDEX:3,TROPONINI:3 in the last 72 hours No components found with this basename: POCBNP:3 No results found for this basename: DDIMER:2 in the last 72 hours No results found for this basename: HGBA1C:2 in the last 72 hours No results found for this basename: CHOL:2,HDL:2,LDLCALC:2,TRIG:2,CHOLHDL:2,LDLDIRECT:2 in the last 72 hours No results found for  this basename: TSH,T4TOTAL,FREET3,T3FREE,THYROIDAB in the last 72 hours No results found for this basename: VITAMINB12:2,FOLATE:2,FERRITIN:2,TIBC:2,IRON:2,RETICCTPCT:2 in the last 72 hours  Micro Results: No results found for this or any previous visit (from the past 240 hour(s)).   Radiological Exams on Admission: No results found.  Assessment/Plan Present on Admission:  .DVT (deep venous thrombosis), left .CVA hemorrhagic in 2009 Admit to [facility] for placement of IVC filter  Patient without any clinical evidence of pulmonary extension  DVT secondary to immobility, patient had removal of prosthesis in right hip due to infection  ESRD (end stage renal diseaon) Patient with [facility]. Will defer to a.m. team to consult as needed. Next dialysis due on Monday  Mild hypotension Patient's mildly hypotensive with systolic blood pressure of 90. Patient asymptomatic.  Bolus of 250 cc normal saline has been ordered. Systolic blood  pressure improved, currently 99 Will hold patient's metoprolol and place hold parameters on patient's Cardizem.  Patient white blood count is slightly elevated at 13, will order UA and a chest x-ray Will give patient an empiric dose of Rocephin, await studies  Right hip surgical wounds/status post removal of prosthesis due to infection Space with antibiotic are in place, patient on oral antibiotics. Will continue Cipro Will consult wound care for continued dressing changes  Orthopedic Dr Milagros Reap plans to take patient back to OR on June 7 to remove residual fluid in right hip ant to evaluate for clearance of infection.  Obstructive sleep apnea/morbid obesity  .Gout .DEPRESSION .Morbid obesity .Atrial fibrillation will order CPAP machine and resume home medications except as outlined above   Full code SCDs for prophylaxis Team 7/Dr. Dwain Sarna, Fryda Molenda 12/08/2011, 4:32 AM

## 2011-12-08 NOTE — Progress Notes (Signed)
Bilateral lower extremity venous duplex completed.  Preliminary report is positive for DVT in the bilateral common femoral, femoral, profunda, popliteal, and posterior tibial veins.  The left is also positive for DVT in the peroneal vein as well as SVT in the greater saphenous vein.

## 2011-12-08 NOTE — ED Notes (Signed)
Report called to [place] rn. Denies further questions at this time

## 2011-12-08 NOTE — ED Notes (Signed)
MD at bedside. 

## 2011-12-08 NOTE — ED Notes (Signed)
Nurse unavailable for report. Will call back per floor

## 2011-12-08 NOTE — Procedures (Signed)
IVC filter  

## 2011-12-09 DIAGNOSIS — I10 Essential (primary) hypertension: Secondary | ICD-10-CM

## 2011-12-09 DIAGNOSIS — I4891 Unspecified atrial fibrillation: Secondary | ICD-10-CM

## 2011-12-09 DIAGNOSIS — I749 Embolism and thrombosis of unspecified artery: Secondary | ICD-10-CM

## 2011-12-09 DIAGNOSIS — I619 Nontraumatic intracerebral hemorrhage, unspecified: Secondary | ICD-10-CM

## 2011-12-09 MED ORDER — HEPARIN SODIUM (PORCINE) 1000 UNIT/ML DIALYSIS
20.0000 [IU]/kg | INTRAMUSCULAR | Status: DC | PRN
  Filled 2011-12-09: qty 2

## 2011-12-09 MED ORDER — RENA-VITE PO TABS
1.0000 | ORAL_TABLET | Freq: Every day | ORAL | Status: DC
  Administered 2011-12-09: 1 via ORAL
  Filled 2011-12-09: qty 1

## 2011-12-09 MED ORDER — CALCIUM ACETATE 667 MG PO CAPS
667.0000 mg | ORAL_CAPSULE | Freq: Three times a day (TID) | ORAL | Status: DC
  Administered 2011-12-09 (×2): 667 mg via ORAL
  Filled 2011-12-09 (×4): qty 1

## 2011-12-09 NOTE — Progress Notes (Signed)
Patient discharge to [facility] per Dr. Sanjuana Letters, call placed to [place] worker to notify of patient discharge.  Report call to Laurell Josephs, spoke with Laverna Peace, RN. Copy of Discharge forms sent with patient alone with d/c summary.

## 2011-12-09 NOTE — Progress Notes (Signed)
Subjective:  Mild left calf pain; otherwise, no complaints.  Objective: Vital signs in last 24 hours: Temp:  [97.3 F (36.3 C)-98.2 F (36.8 C)] 97.8 F (36.6 C) (05/26 0451) Pulse Rate:  [88-94] 88  (05/26 0451) Resp:  [17-26] 18  (05/26 0451) BP: (84-131)/(47-76) 101/59 mmHg (05/26 0451) SpO2:  [95 %-100 %] 95 % (05/26 0451) Weight:  [95.4 kg (210 lb 5.1 oz)-101.152 kg (223 lb)] 101.152 kg (223 lb) (05/25 2046) Weight change:   Intake/Output from previous day: 05/25 0701 - 05/26 0700 In: 120 [P.O.:120] Out: -    EXAM: General appearance:  Alert, in no apparent distress Resp:  CTA bilaterally Cardio:  RRR without murmur GI: + BS, soft and nontender Extremities:  Nonpitting edema in left extremity Access:  Right IJ catheter, AVF @ LUA maturing with + bruit  Lab Results:  Basename 12/08/11 2323 12/08/11 0909  WBC 14.6* 13.4*  HGB 12.4 12.5  HCT 38.4 39.6  PLT 190 193   BMET:  Basename 12/08/11 2323 12/08/11 0909  NA 132* 135  K 4.4 4.3  CL 95* 95*  CO2 25 26  GLUCOSE 92 89  BUN 39* 26*  CREATININE 4.54* 3.48*  CALCIUM 10.0 9.8  ALBUMIN -- --   No results found for this basename: PTH:2 in the last 72 hours Iron Studies: No results found for this basename: IRON,TIBC,TRANSFERRIN,FERRITIN in the last 72 hours  Dialysis Orders: Center: [facility] on MWF .  EDW 99.5 kg HD Bath 3K/2.25Ca Time 4 hrs Heparin 10,000 U. Access Right IJ catheter, maturing AVF @ LUA BFR 400 DFR 800  Zemplar 0 mcg IV/HD Epogen 0 Units IV/HD Venofer 0  Assessment/Plan: 1. Left lower extremity DVT - not a candidate for Coumadin, secondary to hemorrhagic CVA in 2009, while on med for A-fib; IVC filter placement per IR yesterday. 2. ESRD - HD on MWF at [facility], using HD catheter, AVF placed on 3/15 with superficialization on 4/29 (both by Dr. Donnetta Hutching), K 4.4 stable. Next HD tomorrow. 3. Hypertension/volume - BP 101/59 on Diltiazem 240 mg qd (also Lopressor 50 mg bid as outpatient); wt today 101.2 kg with  EDW 99.5. 4. Anemia - Hgb 12.5 off Epogen. 5. Metabolic bone disease - Ca 10 yesterday, last P 5 on 4/24, off Zemplar, on Phoslo 1 with meals. 6. Nutrition - Last Alb 3.3. 7. History of atrial fibrillation - on Diltiazem, HR controlled. 8. History of right femur fx - history of pseudomonas sepsis secondary to infected joint hardware s/p removal 08/29/11 per Dr. Alvan Dame.    LOS: 2 days   LYLES,CHARLES 12/09/2011,7:34 AM  Patient seen and examined and agree with assessment and plan as above. Patient asking to go home, she has had filter placed and does not want coumadin therapy. OK from renal standpoint, will d/w primary MD.  Kelly Splinter  MD [place] [phone] pgr    [phone] cell 12/09/2011, 2:40 PM

## 2011-12-09 NOTE — Discharge Instructions (Signed)
End Stage Kidney Disease End-stage kidney disease occurs when your kidneys no longer work well enough to support day-to-day life. It usually occurs when longstanding (chronic) kidney failure gets worse, to the point where kidney function is less than 10% of normal. At this point, the kidney function is so low that death will occur from buildup of fluids and waste products in the body. The most common cause of kidney failure is diabetes. Kidney failure is very common. ESRD (End Stage Renal Disease) almost always follows chronic kidney failure or renal insufficiency. This condition may exist for 10 to 20 years or more before developing into ESRD. SYMPTOMS   Unintentional weight loss.   Fatigue, anemia.   Generalized itching (pruritus).   Easy bruising or bleeding.   Drowsiness, lethargy.   Muscle twitching or cramps.   Skin may appear yellow or brown.   General ill feeling.   Frequent hiccups.   No or decreased urine output.   Decreased alertness.   Coma.   Increased skin pigmentation.   Decreased sensation in the hands, feet, or other areas.  DIAGNOSIS  Your caregiver will be able to tell what is wrong by talking to you, doing an examination, and doing laboratory tests. The blood work and urinalysis will show that your kidneys are not working well enough. TREATMENT  Dialysis or kidney transplantation are the only treatments for ESRD. Your health, age and other factors determine which treatment is best. Other treatments for chronic renal failure should continue. Associated diseases that cause kidney failure should be controlled. Some of these are:  Hypertension.   Kidney stones.   Heart failure.   Obstructions of the urinary tract.   Urinary tract infections.   Glomerulonephritis.  PROGNOSIS  ESRD is fatal unless treated with dialysis or transplantation. Both of these treatments can have serious risks and consequences. The outcome varies and is unique to each  individual. RISKS AND COMPLICATIONS Complications of dialysis and kidney transplantation:  Hypertension (kidneys try to raise blood pressure so they can work better).   Platelet dysfunction (cells in blood which help with clotting are defective).   Gastrointestinal loss of blood, duodenal or peptic ulcers.   Hemorrhage (bleeding problems).   Anemia (not enough blood cells are produced).   Hepatitis B, hepatitis C, liver failure (exposure may occur during dialysis).   Infection from decreased operation of white blood cells and immune system.   Multiple cancers form, from long-term immunosuppressant use.   Peripheral neuropathy (damage to your nerves).   Seizures (convulsions).   Encephalopathy, nervous system damage, dementia (changes in your brain).   Weakening of the bones, fractures, joint disorders, joint replacements are common.   Permanent skin pigmentation changes.   Skin dryness, itching, scratching resulting in skin infection from hydration problems.   Changes in glucose metabolism.   Changes in electrolyte levels (salts in your blood).   Decreased libido, impotence (loss of interest in sex or ability to function well).   Miscarriage, menstrual irregularities, infertility.   Pericarditis (inflammation of the lining surrounding the heart).   Cardiac tamponade (fluid collection around the heart).   Heart failure in which your heart cannot pump well enough to keep up with the work.  PREVENTION  Treatment of the causes of longstanding kidney failure may delay or prevent progression to ESRD. For example, diabetes which is under strict control is less likely to cause renal failure than diabetes which is left untreated. Some causes of renal failure cannot be treated. Document Released: 09/22/2003 Document  Revised: 06/21/2011 Document Reviewed: 07/02/2005 [facility] Patient Information 2012 [place].

## 2011-12-09 NOTE — Discharge Summary (Signed)
DISCHARGE SUMMARY  [name]  MR#: [id number]  DOB:Jun 01, 1946  Date of Admission: 12/07/2011 Date of Discharge: 12/09/2011  Attending Physician:Saliha Salts  Patient's CP:[id number] Jane Sjogren, DO, DO  Consults:Treatment Team:  Sol Blazing, MD  Discharge Diagnoses: Present on Admission:  .DVT (deep venous thrombosis), left .CVA .ESRD (end stage renal disease) .Gout .HYPERTENSION .DEPRESSION .Morbid obesity .Atrial fibrillation  Hospital Course: [name] was admitted on 12/07/11 with swelling of the lower extremity. She was found to have bilateral acute lower extremity dvts, and had ivc filter placed as she is not an ideal candidate for anticoagulation due to prior complications with coumadin. Patient eager to go home. Family will arrange for transportation to d/c today.   Medication List  As of 12/09/2011  3:34 PM   TAKE these medications         calcium acetate 667 MG capsule   Commonly known as: PHOSLO   Take 667 mg by mouth 2 (two) times daily before a meal.      ciprofloxacin 500 MG tablet   Commonly known as: CIPRO   Take 500 mg by mouth daily at 6 PM.      diltiazem 240 MG 24 hr capsule   Commonly known as: CARDIZEM CD   Take 240 mg by mouth daily.      diphenhydrAMINE 25 MG tablet   Commonly known as: BENADRYL   Take 25 mg by mouth every 6 (six) hours as needed. Itching/rash      feeding supplement Liqd   Take 30 mLs by mouth 2 (two) times daily.      HYDROcodone-acetaminophen 7.5-325 MG per tablet   Commonly known as: NORCO   Take 1 tablet by mouth every 4 (four) hours as needed. For pain      metoprolol 50 MG tablet   Commonly known as: LOPRESSOR   Take 50 mg by mouth 2 (two) times daily. Hold on mornings of dialysis (M,W,F)      multivitamins ther. w/minerals Tabs   Take 1 tablet by mouth 2 (two) times daily.      NEPRO PO   Take 1 Can by mouth daily.      polyethylene glycol packet   Commonly known as: MIRALAX / GLYCOLAX   Take 17 g by  mouth 2 (two) times daily.      sertraline 50 MG tablet   Commonly known as: ZOLOFT   Take 50 mg by mouth every morning. Take before breakfast      sotalol 80 MG tablet   Commonly known as: BETAPACE   Take 80 mg by mouth 2 (two) times daily. Takes at 1000 & 2200             Day of Discharge BP 86/49  Pulse 77  Temp(Src) 98.1 F (36.7 C) (Oral)  Resp 18  Ht 5\' 6"  (1.676 m)  Wt 101.152 kg (223 lb)  BMI 35.99 kg/m2  SpO2 100%  Physical Exam: Comfortable at rest. B/l leg swelling, left more than right.  Results for orders placed during the hospital encounter of 12/07/11 (from the past 24 hour(s))  BASIC METABOLIC PANEL     Status: Abnormal   Collection Time   12/08/11 11:23 PM      Component Value Range   Sodium 132 (*) 135 - 145 (mEq/L)   Potassium 4.4  3.5 - 5.1 (mEq/L)   Chloride 95 (*) 96 - 112 (mEq/L)   CO2 25  19 - 32 (mEq/L)   Glucose, Bld 92  70 - 99 (mg/dL)   BUN 39 (*) 6 - 23 (mg/dL)   Creatinine, Ser 4.54 (*) 0.50 - 1.10 (mg/dL)   Calcium 10.0  8.4 - 10.5 (mg/dL)   GFR calc non Af Amer 9 (*) >90 (mL/min)   GFR calc Af Amer 11 (*) >90 (mL/min)  CBC     Status: Abnormal   Collection Time   12/08/11 11:23 PM      Component Value Range   WBC 14.6 (*) 4.0 - 10.5 (K/uL)   RBC 4.75  3.87 - 5.11 (MIL/uL)   Hemoglobin 12.4  12.0 - 15.0 (g/dL)   HCT 38.4  36.0 - 46.0 (%)   MCV 80.8  78.0 - 100.0 (fL)   MCH 26.1  26.0 - 34.0 (pg)   MCHC 32.3  30.0 - 36.0 (g/dL)   RDW 15.9 (*) 11.5 - 15.5 (%)   Platelets 190  150 - 400 (K/uL)    Disposition: home.   Follow-up Appts: Discharge Orders    Future Orders Please Complete By Expires   Diet - low sodium heart healthy      Increase activity slowly           Time spent in discharge (includes decision making & examination of pt): 25 minutes  Signed: Tianna [name] 12/09/2011, 3:34 PM

## 2011-12-10 ENCOUNTER — Encounter (HOSPITAL_COMMUNITY): Payer: Self-pay | Admitting: Pharmacy Technician

## 2011-12-12 ENCOUNTER — Telehealth: Payer: Self-pay | Admitting: *Deleted

## 2011-12-12 NOTE — Telephone Encounter (Signed)
Patient home care nurse called to get the appt an appt because she missed her last one. Not able to get her in with Dr Tommy Medal as he is booked until August. Put her on Dr Baxter Flattery schedule for 12/27/11 at 1045.

## 2011-12-20 ENCOUNTER — Encounter (HOSPITAL_COMMUNITY): Payer: Self-pay | Admitting: *Deleted

## 2011-12-20 NOTE — Progress Notes (Signed)
Cardiologist is Dr.Tracy Turner with last visit 27months ago  Echo in epic from 2008/2010/2013 Stress test done in 2013 and to request as well as office visit  Medical MD is Dr.Loney

## 2011-12-20 NOTE — H&P (Signed)
[name] is an 66 y.o. female.    Chief Complaint:  Recurrent right hip pain with fluid collection, with history it could possibly be infection.   HPI: This is a 66 year old lady status post total hip arthroplasty with subsequent fall and periprosthetic fracture, with subsequent wound breakdown and infection. She has been utilizing a wound VAC for wound closure, but still has elevated sed rate and CRP. Subsequent surgery removed the prosthesis, washed out the area and implantation of a antibiotic spacer.  On a subsequent office vist the patient started to have right hip pain, an MRI was obtained which showed a collection of fluid. With the patient's history and the MRI findings there is a potential of infection in the right hip.  Plan is to I&D the right hip, cleaning out the incision and removal or and reimplantation of a antibiotic spacer.  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  The surgery, risks, benefits, and aftercare were discussed in detail with the patient and her husband; they understand and wish to proceed.  Questions invited and answered.   PCP:  Garnet Koyanagi, DO, DO  D/C Plans:  SNF  Post-op Meds:  No Rx given   Tranexamic Acid:   Not to be given  Decadron:   Not to be given  PMH: Past Medical History  Diagnosis Date  . Fractures, stress     in both feet  . 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 Stroke   . Gout     doesn't require meds   . Anemia   . Blood transfusion     never had a reaction to blood transfusion  . Arthritis     HIP  . Cardiomyopathy   . Hypercalcemia     09/10/11  . HCAP (healthcare-associated pneumonia)     09/10/11  . CHF (congestive heart failure)   . Hypertension     takes Diltiazem daily   . Atrial fibrillation     PT TAKES ASPIRIN AS BLOOD THINNER-HX OF CEREBRAL BLEED WHILE ON COUMADIN  . Arrhythmia     takes Metoprolol daily  . H/O blood clots    bilateral legs-2wks ago  . Asthma     as a child  . Eczema   . Constipation     takes Miralax daily  . Oligouria   . Kidney disease     dialysis on M/W/F on [place]  . Depression     takes Zoloft daily  . Sleep apnea     uses CPAP-sleep study done 2007    PSH: Past Surgical History  Procedure Date  . Cholecystectomy   . 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 Joint replacement     right hip and right femur x 2  . Right femur fracture x2     FRACTURES WERE AFTER AFTER RIGHT HIP REPLACEMENT  . I&d extremity 09/15/2011    Procedure: IRRIGATION AND DEBRIDEMENT EXTREMITY;  Surgeon: [name], MD;  Location: [facility];  Service: Orthopedics;  Laterality: Right;  . Insertion of dialysis catheter 09/28/2011    Procedure: INSERTION OF DIALYSIS CATHETER;  Surgeon: Rosetta Posner, MD;  Location: [facility];  Service: Vascular;  Laterality: Right;  . Av fistula placement 09/28/2011    Procedure: ARTERIOVENOUS (AV) FISTULA CREATION;  Surgeon: Rosetta Posner, MD;  Location: [place];  Service:  Vascular;  Laterality: Left;  . Cystoscopy     many yrs ago  . Ivp filter 2013  . Colonoscopy     Social History:  reports that she has quit smoking. She has never used smokeless tobacco. She reports that she does not drink alcohol or use illicit drugs.  Allergies:  Allergies  Allergen Reactions  . Warfarin Sodium Other (See Comments)    Caused her to have a stroke    Medications: No current facility-administered medications for this encounter.   Current Outpatient Prescriptions  Medication Sig Dispense Refill  . calcium acetate (PHOSLO) 667 MG capsule Take 667 mg by mouth 2 (two) times daily before a meal.       . ciprofloxacin (CIPRO) 500 MG tablet Take 500 mg by mouth daily at 6 PM.      . diltiazem (CARDIZEM CD) 240 MG 24 hr capsule Take 240 mg by mouth daily.      . diphenhydrAMINE (BENADRYL) 25 MG tablet Take 25 mg by mouth every 6 (six) hours as needed. Itching/rash        . feeding supplement (PRO-STAT SUGAR FREE 64) LIQD Take 30 mLs by mouth 2 (two) times daily.      Marland Kitchen HYDROcodone-acetaminophen (NORCO) 7.5-325 MG per tablet Take 1 tablet by mouth every 4 (four) hours as needed. For pain      . metoprolol (LOPRESSOR) 50 MG tablet Take 50 mg by mouth 2 (two) times daily. Hold on mornings of dialysis (M,W,F)      . Multiple Vitamins-Minerals (MULTIVITAMINS THER. W/MINERALS) TABS Take 1 tablet by mouth 2 (two) times daily.       . Nutritional Supplements (NEPRO PO) Take 1 Can by mouth daily.      . polyethylene glycol (MIRALAX / GLYCOLAX) packet Take 17 g by mouth 2 (two) times daily.      . sertraline (ZOLOFT) 50 MG tablet Take 50 mg by mouth every morning. Take before breakfast      . sotalol (BETAPACE) 80 MG tablet Take 80 mg by mouth 2 (two) times daily. Takes at 1000 & 2200      . DISCONTD: colchicine 0.6 MG tablet Take 1 tablet (0.6 mg total) by mouth daily.  31 tablet  0  . DISCONTD: furosemide (LASIX) 80 MG tablet Take 80-160 mg by mouth 2 (two) times daily. 2 tab in am and 1 tab      . DISCONTD: gabapentin (NEURONTIN) 300 MG capsule Take 300 mg by mouth 3 (three) times daily.       Marland Kitchen DISCONTD: metoprolol (TOPROL XL) 100 MG 24 hr tablet Take 100 mg by mouth daily before breakfast.       . DISCONTD: potassium chloride SA (KLOR-CON M20) 20 MEQ tablet Take 20 mEq by mouth daily.       Marland Kitchen DISCONTD: rivaroxaban (XARELTO) 10 MG TABS tablet Take 1 tablet (10 mg total) by mouth daily.  14 tablet  0    ROS: Review of Systems  Constitutional: Positive for malaise/fatigue.  HENT: Negative.   Eyes: Negative.   Respiratory: Negative.   Cardiovascular: Negative.   Gastrointestinal: Negative.   Musculoskeletal: Positive for myalgias and joint pain.  Skin: Negative.   Neurological: Positive for weakness.  Endo/Heme/Allergies: Negative.   Psychiatric/Behavioral: Positive for depression.     Physical Exam: BP: 107/71 ; HR: 72 ; Resp: 16; Physical Exam   Constitutional: She is oriented to person, place, and time and well-developed, well-nourished, and in no distress. Vital signs are  normal.  HENT:  Head: Normocephalic and atraumatic.  Nose: Nose normal.  Mouth/Throat: Oropharynx is clear and moist.  Eyes: Pupils are equal, round, and reactive to light.  Neck: Neck supple. No JVD present. No tracheal deviation present. No thyromegaly present.  Cardiovascular: Normal rate, regular rhythm, normal heart sounds and intact distal pulses.   Pulmonary/Chest: Effort normal and breath sounds normal. No respiratory distress. She has no wheezes. She exhibits no tenderness.  Abdominal: Soft. There is no tenderness. There is no guarding.  Musculoskeletal:       Right hip: She exhibits decreased range of motion, decreased strength, tenderness, swelling and laceration. She exhibits no crepitus and no deformity.  Lymphadenopathy:    She has no cervical adenopathy.  Neurological: She is alert and oriented to person, place, and time.  Skin: Skin is warm and dry.  Psychiatric: Affect normal.      Assessment/Plan Assessment:  Recurrent right hip pain with fluid collection, with history it could possibly be infection.    Plan: Patient will undergo an I&D with removal of and reimplantation of antibiotic spacer on 12/18/2011 per Dr. Alvan Dame at [facility]. Risks benefits and expectation were discussed with the patient. Patient understand risks, benefits and expectation and wishes to proceed.   [place] Samuele Storey   PAC  12/20/2011, 5:14 PM

## 2011-12-20 NOTE — Progress Notes (Signed)
Instructions provided to husband as well as nurse at [facility]

## 2011-12-20 NOTE — Progress Notes (Deleted)
Sleep study done about 2 yrs ago-on [place]

## 2011-12-21 ENCOUNTER — Encounter (HOSPITAL_COMMUNITY)
Admission: RE | Disposition: A | Discharge status: Skilled Nursing Facility | Payer: Self-pay | Source: Ambulatory Visit | Attending: Orthopedic Surgery

## 2011-12-21 ENCOUNTER — Encounter (HOSPITAL_COMMUNITY): Payer: Self-pay | Admitting: Anesthesiology

## 2011-12-21 ENCOUNTER — Inpatient Hospital Stay (HOSPITAL_COMMUNITY)
Admission: RE | Admit: 2011-12-21 | Discharge: 2011-12-25 | DRG: 498 | Disposition: A | Discharge status: Skilled Nursing Facility | Payer: Medicare Other | Source: Ambulatory Visit | Attending: Orthopedic Surgery | Admitting: Orthopedic Surgery

## 2011-12-21 ENCOUNTER — Ambulatory Visit (HOSPITAL_COMMUNITY): Payer: Medicare Other | Admitting: Anesthesiology

## 2011-12-21 ENCOUNTER — Encounter (HOSPITAL_COMMUNITY): Payer: Self-pay | Admitting: *Deleted

## 2011-12-21 ENCOUNTER — Encounter (HOSPITAL_COMMUNITY): Payer: Self-pay | Admitting: General Practice

## 2011-12-21 DIAGNOSIS — J45909 Unspecified asthma, uncomplicated: Secondary | ICD-10-CM | POA: Diagnosis present

## 2011-12-21 DIAGNOSIS — Z7982 Long term (current) use of aspirin: Secondary | ICD-10-CM

## 2011-12-21 DIAGNOSIS — N2581 Secondary hyperparathyroidism of renal origin: Secondary | ICD-10-CM | POA: Diagnosis present

## 2011-12-21 DIAGNOSIS — I82403 Acute embolism and thrombosis of unspecified deep veins of lower extremity, bilateral: Secondary | ICD-10-CM

## 2011-12-21 DIAGNOSIS — Z992 Dependence on renal dialysis: Secondary | ICD-10-CM

## 2011-12-21 DIAGNOSIS — I120 Hypertensive chronic kidney disease with stage 5 chronic kidney disease or end stage renal disease: Secondary | ICD-10-CM | POA: Diagnosis present

## 2011-12-21 DIAGNOSIS — K5900 Constipation, unspecified: Secondary | ICD-10-CM | POA: Diagnosis present

## 2011-12-21 DIAGNOSIS — N186 End stage renal disease: Secondary | ICD-10-CM | POA: Diagnosis present

## 2011-12-21 DIAGNOSIS — Z8673 Personal history of transient ischemic attack (TIA), and cerebral infarction without residual deficits: Secondary | ICD-10-CM

## 2011-12-21 DIAGNOSIS — L02415 Cutaneous abscess of right lower limb: Secondary | ICD-10-CM

## 2011-12-21 DIAGNOSIS — M009 Pyogenic arthritis, unspecified: Principal | ICD-10-CM | POA: Diagnosis present

## 2011-12-21 DIAGNOSIS — I428 Other cardiomyopathies: Secondary | ICD-10-CM | POA: Diagnosis present

## 2011-12-21 DIAGNOSIS — I509 Heart failure, unspecified: Secondary | ICD-10-CM | POA: Diagnosis present

## 2011-12-21 DIAGNOSIS — D649 Anemia, unspecified: Secondary | ICD-10-CM | POA: Diagnosis present

## 2011-12-21 HISTORY — DX: Anuria and oliguria: R34

## 2011-12-21 HISTORY — PX: IRRIGATION AND DEBRIDEMENT ABSCESS: SHX5252

## 2011-12-21 HISTORY — DX: Dermatitis, unspecified: L30.9

## 2011-12-21 HISTORY — PX: INCISION AND DRAINAGE HIP: SHX1801

## 2011-12-21 HISTORY — DX: Peripheral vascular disease, unspecified: I73.9

## 2011-12-21 HISTORY — DX: Constipation, unspecified: K59.00

## 2011-12-21 HISTORY — DX: Acute embolism and thrombosis of unspecified deep veins of lower extremity, bilateral: I82.403

## 2011-12-21 HISTORY — DX: End stage renal disease: N18.6

## 2011-12-21 HISTORY — DX: Dependence on renal dialysis: Z99.2

## 2011-12-21 LAB — BASIC METABOLIC PANEL
BUN: 25 mg/dL — ABNORMAL HIGH (ref 6–23)
CO2: 27 mEq/L (ref 19–32)
Calcium: 9.7 mg/dL (ref 8.4–10.5)
Chloride: 98 mEq/L (ref 96–112)
Creatinine, Ser: 2.89 mg/dL — ABNORMAL HIGH (ref 0.50–1.10)
GFR calc Af Amer: 19 mL/min — ABNORMAL LOW (ref >90)
GFR calc non Af Amer: 16 mL/min — ABNORMAL LOW (ref >90)
Glucose, Bld: 85 mg/dL (ref 70–99)
Potassium: 3.2 mEq/L — ABNORMAL LOW (ref 3.5–5.1)
Sodium: 139 mEq/L (ref 135–145)

## 2011-12-21 LAB — CBC
HCT: 37.4 % (ref 36.0–46.0)
Hemoglobin: 11.8 g/dL — ABNORMAL LOW (ref 12.0–15.0)
MCH: 25.8 pg — ABNORMAL LOW (ref 26.0–34.0)
MCHC: 31.6 g/dL (ref 30.0–36.0)
MCV: 81.8 fL (ref 78.0–100.0)
Platelets: 246 10*3/uL (ref 150–400)
RBC: 4.57 MIL/uL (ref 3.87–5.11)
RDW: 16.4 % — ABNORMAL HIGH (ref 11.5–15.5)
WBC: 10 10*3/uL (ref 4.0–10.5)

## 2011-12-21 LAB — TYPE AND SCREEN
ABO/RH(D): O POS
Antibody Screen: NEGATIVE

## 2011-12-21 LAB — URINE MICROSCOPIC-ADD ON

## 2011-12-21 LAB — GRAM STAIN

## 2011-12-21 LAB — URINALYSIS, ROUTINE W REFLEX MICROSCOPIC
Glucose, UA: NEGATIVE mg/dL
Ketones, ur: 15 mg/dL — AB
Nitrite: NEGATIVE
Protein, ur: >300 mg/dL — AB
Specific Gravity, Urine: 1.022 (ref 1.005–1.030)
Urobilinogen, UA: 0.2 mg/dL (ref 0.0–1.0)
pH: 5 (ref 5.0–8.0)

## 2011-12-21 LAB — APTT: aPTT: 28 seconds (ref 24–37)

## 2011-12-21 LAB — PROTIME-INR
INR: 1.05 (ref 0.00–1.49)
Prothrombin Time: 13.9 seconds (ref 11.6–15.2)

## 2011-12-21 SURGERY — IRRIGATION AND DEBRIDEMENT HIP
Anesthesia: Choice | Site: Hip | Laterality: Right | Wound class: Contaminated

## 2011-12-21 MED ORDER — VANCOMYCIN HCL 1000 MG IV SOLR
INTRAVENOUS | Status: DC | PRN
  Administered 2011-12-21: 1000 mg

## 2011-12-21 MED ORDER — METOPROLOL TARTRATE 50 MG PO TABS
50.0000 mg | ORAL_TABLET | Freq: Two times a day (BID) | ORAL | Status: DC
  Administered 2011-12-24 – 2011-12-25 (×2): 50 mg via ORAL
  Filled 2011-12-21 (×9): qty 1

## 2011-12-21 MED ORDER — DOCUSATE SODIUM 100 MG PO CAPS
100.0000 mg | ORAL_CAPSULE | Freq: Two times a day (BID) | ORAL | Status: DC
  Administered 2011-12-21 – 2011-12-25 (×6): 100 mg via ORAL
  Filled 2011-12-21 (×8): qty 1

## 2011-12-21 MED ORDER — METOCLOPRAMIDE HCL 5 MG/ML IJ SOLN: 5.0000 mg | Freq: Three times a day (TID) | INTRAMUSCULAR | Status: DC | PRN

## 2011-12-21 MED ORDER — METOPROLOL TARTRATE 50 MG PO TABS
50.0000 mg | ORAL_TABLET | Freq: Once | ORAL | Status: AC
  Administered 2011-12-21: 50 mg via ORAL
  Filled 2011-12-21: qty 1

## 2011-12-21 MED ORDER — PRO-STAT SUGAR FREE PO LIQD
30.0000 mL | Freq: Two times a day (BID) | ORAL | Status: DC
  Administered 2011-12-21 – 2011-12-25 (×7): 30 mL via ORAL
  Filled 2011-12-21 (×9): qty 30

## 2011-12-21 MED ORDER — SODIUM CHLORIDE 0.9 % IR SOLN
Status: DC | PRN
  Administered 2011-12-21: 15:00:00

## 2011-12-21 MED ORDER — HYDROMORPHONE HCL PF 1 MG/ML IJ SOLN
INTRAMUSCULAR | Status: AC
  Filled 2011-12-21: qty 1

## 2011-12-21 MED ORDER — SERTRALINE HCL 50 MG PO TABS
50.0000 mg | ORAL_TABLET | Freq: Every morning | ORAL | Status: DC
  Administered 2011-12-22 – 2011-12-25 (×4): 50 mg via ORAL
  Filled 2011-12-21 (×4): qty 1

## 2011-12-21 MED ORDER — THERA M PLUS PO TABS: 1.0000 | ORAL_TABLET | Freq: Two times a day (BID) | ORAL | Status: DC

## 2011-12-21 MED ORDER — DIPHENHYDRAMINE HCL 25 MG PO TABS
25.0000 mg | ORAL_TABLET | Freq: Four times a day (QID) | ORAL | Status: DC | PRN
  Administered 2011-12-21 – 2011-12-23 (×5): 25 mg via ORAL
  Filled 2011-12-21 (×6): qty 1

## 2011-12-21 MED ORDER — MENTHOL 3 MG MT LOZG: 1.0000 | LOZENGE | OROMUCOSAL | Status: DC | PRN

## 2011-12-21 MED ORDER — SODIUM CHLORIDE 0.9 % IV SOLN
100.0000 mL/h | INTRAVENOUS | Status: DC
  Administered 2011-12-21 – 2011-12-23 (×2): 100 mL/h via INTRAVENOUS
  Filled 2011-12-21 (×11): qty 1000

## 2011-12-21 MED ORDER — ZOLPIDEM TARTRATE 5 MG PO TABS: 5.0000 mg | ORAL_TABLET | Freq: Every evening | ORAL | Status: DC | PRN

## 2011-12-21 MED ORDER — SODIUM CHLORIDE 0.9 % IR SOLN
Status: DC | PRN
  Administered 2011-12-21: 1000 mL

## 2011-12-21 MED ORDER — FERROUS SULFATE 325 (65 FE) MG PO TABS
325.0000 mg | ORAL_TABLET | Freq: Three times a day (TID) | ORAL | Status: DC
  Administered 2011-12-22: 325 mg via ORAL
  Filled 2011-12-21 (×5): qty 1

## 2011-12-21 MED ORDER — HYDROCODONE-ACETAMINOPHEN 7.5-325 MG PO TABS
1.0000 | ORAL_TABLET | ORAL | Status: DC | PRN
  Administered 2011-12-21 – 2011-12-25 (×9): 2 via ORAL
  Filled 2011-12-21 (×10): qty 2

## 2011-12-21 MED ORDER — CEFAZOLIN SODIUM-DEXTROSE 2-3 GM-% IV SOLR
2.0000 g | INTRAVENOUS | Status: AC
Start: 2011-12-21 — End: 2011-12-21
  Administered 2011-12-21: 2 g via INTRAVENOUS

## 2011-12-21 MED ORDER — METHOCARBAMOL 500 MG PO TABS
500.0000 mg | ORAL_TABLET | Freq: Four times a day (QID) | ORAL | Status: DC | PRN
  Administered 2011-12-22: 500 mg via ORAL
  Filled 2011-12-21 (×2): qty 1

## 2011-12-21 MED ORDER — ONDANSETRON HCL 4 MG/2ML IJ SOLN: 4.0000 mg | Freq: Four times a day (QID) | INTRAMUSCULAR | Status: DC | PRN

## 2011-12-21 MED ORDER — PROPOFOL 10 MG/ML IV EMUL
INTRAVENOUS | Status: DC | PRN
  Administered 2011-12-21: 150 mg via INTRAVENOUS

## 2011-12-21 MED ORDER — FENTANYL CITRATE 0.05 MG/ML IJ SOLN
INTRAMUSCULAR | Status: DC | PRN
  Administered 2011-12-21: 100 ug via INTRAVENOUS

## 2011-12-21 MED ORDER — ONDANSETRON HCL 4 MG/2ML IJ SOLN
INTRAMUSCULAR | Status: DC | PRN
  Administered 2011-12-21: 4 mg via INTRAVENOUS

## 2011-12-21 MED ORDER — POLYETHYLENE GLYCOL 3350 17 G PO PACK
17.0000 g | PACK | Freq: Two times a day (BID) | ORAL | Status: DC
  Administered 2011-12-21 – 2011-12-23 (×3): 17 g via ORAL
  Filled 2011-12-21 (×9): qty 1

## 2011-12-21 MED ORDER — SOTALOL HCL 80 MG PO TABS
80.0000 mg | ORAL_TABLET | Freq: Two times a day (BID) | ORAL | Status: DC
  Filled 2011-12-21 (×7): qty 1

## 2011-12-21 MED ORDER — ALUM & MAG HYDROXIDE-SIMETH 200-200-20 MG/5ML PO SUSP: 30.0000 mL | ORAL | Status: DC | PRN

## 2011-12-21 MED ORDER — CEFAZOLIN SODIUM-DEXTROSE 2-3 GM-% IV SOLR
INTRAVENOUS | Status: AC
  Filled 2011-12-21: qty 50

## 2011-12-21 MED ORDER — SODIUM CHLORIDE 0.9 % IV SOLN
INTRAVENOUS | Status: DC | PRN
  Administered 2011-12-21 (×2): via INTRAVENOUS

## 2011-12-21 MED ORDER — DILTIAZEM HCL ER COATED BEADS 240 MG PO CP24
240.0000 mg | ORAL_CAPSULE | Freq: Every day | ORAL | Status: DC
  Administered 2011-12-25: 240 mg via ORAL
  Filled 2011-12-21 (×4): qty 1

## 2011-12-21 MED ORDER — CHLORHEXIDINE GLUCONATE 4 % EX LIQD: 60.0000 mL | Freq: Once | CUTANEOUS | Status: DC

## 2011-12-21 MED ORDER — FLEET ENEMA 7-19 GM/118ML RE ENEM: 1.0000 | ENEMA | Freq: Once | RECTAL | Status: AC | PRN

## 2011-12-21 MED ORDER — BISACODYL 5 MG PO TBEC: 5.0000 mg | DELAYED_RELEASE_TABLET | Freq: Every day | ORAL | Status: DC | PRN

## 2011-12-21 MED ORDER — PHENYLEPHRINE HCL 10 MG/ML IJ SOLN
INTRAMUSCULAR | Status: DC | PRN
  Administered 2011-12-21 (×5): 80 ug via INTRAVENOUS

## 2011-12-21 MED ORDER — METHOCARBAMOL 100 MG/ML IJ SOLN
500.0000 mg | INTRAMUSCULAR | Status: AC
  Administered 2011-12-21: 500 mg via INTRAVENOUS
  Filled 2011-12-21: qty 5

## 2011-12-21 MED ORDER — VANCOMYCIN HCL 1000 MG IV SOLR
2000.0000 mg | INTRAVENOUS | Status: DC
  Filled 2011-12-21: qty 2000

## 2011-12-21 MED ORDER — METOPROLOL TARTRATE 50 MG PO TABS
ORAL_TABLET | ORAL | Status: AC
  Administered 2011-12-21: 50 mg via ORAL
  Filled 2011-12-21: qty 1

## 2011-12-21 MED ORDER — VANCOMYCIN HCL 1000 MG IV SOLR
2000.0000 mg | Freq: Once | INTRAVENOUS | Status: AC
  Administered 2011-12-21: 2000 mg via INTRAVENOUS
  Filled 2011-12-21: qty 2000

## 2011-12-21 MED ORDER — HYDROCODONE-ACETAMINOPHEN 7.5-325 MG PO TABS: 1.0000 | ORAL_TABLET | ORAL | Status: DC

## 2011-12-21 MED ORDER — PHENOL 1.4 % MT LIQD: 1.0000 | OROMUCOSAL | Status: DC | PRN

## 2011-12-21 MED ORDER — CIPROFLOXACIN HCL 500 MG PO TABS
500.0000 mg | ORAL_TABLET | Freq: Every day | ORAL | Status: DC
  Administered 2011-12-22 – 2011-12-24 (×3): 500 mg via ORAL
  Filled 2011-12-21 (×4): qty 1

## 2011-12-21 MED ORDER — CALCIUM ACETATE 667 MG PO CAPS
667.0000 mg | ORAL_CAPSULE | Freq: Two times a day (BID) | ORAL | Status: DC
  Administered 2011-12-23 (×2): 667 mg via ORAL
  Filled 2011-12-21 (×6): qty 1

## 2011-12-21 MED ORDER — SUCCINYLCHOLINE CHLORIDE 20 MG/ML IJ SOLN
INTRAMUSCULAR | Status: DC | PRN
  Administered 2011-12-21: 100 mg via INTRAVENOUS

## 2011-12-21 MED ORDER — METOCLOPRAMIDE HCL 10 MG PO TABS: 5.0000 mg | ORAL_TABLET | Freq: Three times a day (TID) | ORAL | Status: DC | PRN

## 2011-12-21 MED ORDER — DEXTROSE 5 % IV SOLN
500.0000 mg | Freq: Four times a day (QID) | INTRAVENOUS | Status: DC | PRN
  Filled 2011-12-21: qty 5

## 2011-12-21 MED ORDER — HYDROMORPHONE HCL PF 1 MG/ML IJ SOLN
0.5000 mg | INTRAMUSCULAR | Status: DC | PRN
  Administered 2011-12-21: 1 mg via INTRAVENOUS
  Filled 2011-12-21: qty 1

## 2011-12-21 MED ORDER — VANCOMYCIN HCL IN DEXTROSE 1-5 GM/200ML-% IV SOLN
1000.0000 mg | INTRAVENOUS | Status: DC
  Filled 2011-12-21 (×2): qty 200

## 2011-12-21 MED ORDER — HYDROMORPHONE HCL PF 1 MG/ML IJ SOLN
0.2500 mg | INTRAMUSCULAR | Status: DC | PRN
  Administered 2011-12-21 (×3): 0.5 mg via INTRAVENOUS

## 2011-12-21 MED ORDER — ONDANSETRON HCL 4 MG PO TABS: 4.0000 mg | ORAL_TABLET | Freq: Four times a day (QID) | ORAL | Status: DC | PRN

## 2011-12-21 SURGICAL SUPPLY — 63 items
BENZOIN TINCTURE PRP APPL 2/3 (GAUZE/BANDAGES/DRESSINGS) ×2 IMPLANT
BLADE SURG 10 STRL SS (BLADE) ×2 IMPLANT
BNDG COHESIVE 4X5 TAN STRL (GAUZE/BANDAGES/DRESSINGS) ×2 IMPLANT
BOWL SMART MIX CTS (DISPOSABLE) IMPLANT
BRUSH FEMORAL CANAL (MISCELLANEOUS) IMPLANT
CLOTH BEACON ORANGE TIMEOUT ST (SAFETY) ×2 IMPLANT
DRAPE INCISE IOBAN 66X45 STRL (DRAPES) ×2 IMPLANT
DRAPE INCISE IOBAN 85X60 (DRAPES) ×2 IMPLANT
DRAPE ORTHO SPLIT 77X108 STRL (DRAPES) ×2
DRAPE POUCH INSTRU U-SHP 10X18 (DRAPES) ×2 IMPLANT
DRAPE PROXIMA HALF (DRAPES) ×4 IMPLANT
DRAPE SURG 17X11 SM STRL (DRAPES) ×2 IMPLANT
DRAPE SURG ORHT 6 SPLT 77X108 (DRAPES) ×2 IMPLANT
DRAPE U-SHAPE 47X51 STRL (DRAPES) ×2 IMPLANT
DRSG AQUACEL AG ADV 3.5X10 (GAUZE/BANDAGES/DRESSINGS) IMPLANT
DRSG MEPILEX BORDER 4X12 (GAUZE/BANDAGES/DRESSINGS) ×2 IMPLANT
DRSG MEPILEX BORDER 4X8 (GAUZE/BANDAGES/DRESSINGS) ×2 IMPLANT
DRSG TEGADERM 4X4.75 (GAUZE/BANDAGES/DRESSINGS) ×2 IMPLANT
DURAPREP 26ML APPLICATOR (WOUND CARE) ×2 IMPLANT
ELECT BLADE 4.0 EZ CLEAN MEGAD (MISCELLANEOUS) ×2
ELECT BLADE TIP CTD 4 INCH (ELECTRODE) ×2 IMPLANT
ELECT REM PT RETURN 9FT ADLT (ELECTROSURGICAL) ×2
ELECTRODE BLDE 4.0 EZ CLN MEGD (MISCELLANEOUS) ×1 IMPLANT
ELECTRODE REM PT RTRN 9FT ADLT (ELECTROSURGICAL) ×1 IMPLANT
EVACUATOR 1/8 PVC DRAIN (DRAIN) IMPLANT
FACESHIELD LNG OPTICON STERILE (SAFETY) ×4 IMPLANT
FLUID NSS /IRRIG 3000 ML XXX (IV SOLUTION) ×6 IMPLANT
GLOVE BIOGEL PI IND STRL 7.5 (GLOVE) ×1 IMPLANT
GLOVE BIOGEL PI IND STRL 8 (GLOVE) ×2 IMPLANT
GLOVE BIOGEL PI INDICATOR 7.5 (GLOVE) ×1
GLOVE BIOGEL PI INDICATOR 8 (GLOVE) ×2
GLOVE ECLIPSE 8.0 STRL XLNG CF (GLOVE) ×2 IMPLANT
GLOVE ORTHO TXT STRL SZ7.5 (GLOVE) ×2 IMPLANT
GLOVE SURG ORTHO 8.0 STRL STRW (GLOVE) ×2 IMPLANT
GOWN BRE IMP PREV XXLGXLNG (GOWN DISPOSABLE) ×4 IMPLANT
GOWN STRL NON-REIN LRG LVL3 (GOWN DISPOSABLE) ×4 IMPLANT
HANDPIECE INTERPULSE COAX TIP (DISPOSABLE) ×1
IMMOBILIZER KNEE 22 UNIV (SOFTGOODS) IMPLANT
KIT BASIN OR (CUSTOM PROCEDURE TRAY) ×2 IMPLANT
KIT ROOM TURNOVER OR (KITS) ×2 IMPLANT
KIT STIMULAN RAPID CURE  10CC (Orthopedic Implant) ×2 IMPLANT
KIT STIMULAN RAPID CURE 10CC (Orthopedic Implant) ×2 IMPLANT
MANIFOLD NEPTUNE II (INSTRUMENTS) ×2 IMPLANT
NS IRRIG 1000ML POUR BTL (IV SOLUTION) ×2 IMPLANT
PACK GENERAL/GYN (CUSTOM PROCEDURE TRAY) ×2 IMPLANT
PAD ARMBOARD 7.5X6 YLW CONV (MISCELLANEOUS) ×4 IMPLANT
SET HNDPC FAN SPRY TIP SCT (DISPOSABLE) ×1 IMPLANT
SPONGE LAP 18X18 X RAY DECT (DISPOSABLE) ×6 IMPLANT
SPONGE LAP 4X18 X RAY DECT (DISPOSABLE) IMPLANT
STAPLER VISISTAT 35W (STAPLE) ×4 IMPLANT
STOCKINETTE IMPERVIOUS 9X36 MD (GAUZE/BANDAGES/DRESSINGS) IMPLANT
STRIP CLOSURE SKIN 1/2X4 (GAUZE/BANDAGES/DRESSINGS) ×4 IMPLANT
SUCTION FRAZIER TIP 10 FR DISP (SUCTIONS) ×2 IMPLANT
SUT VIC AB 0 CT1 27 (SUTURE) ×3
SUT VIC AB 0 CT1 27XBRD ANBCTR (SUTURE) ×3 IMPLANT
SUT VIC AB 2-0 CT1 27 (SUTURE) ×2
SUT VIC AB 2-0 CT1 TAPERPNT 27 (SUTURE) ×2 IMPLANT
TOWEL OR 17X24 6PK STRL BLUE (TOWEL DISPOSABLE) ×2 IMPLANT
TOWEL OR 17X26 10 PK STRL BLUE (TOWEL DISPOSABLE) ×2 IMPLANT
TOWER CARTRIDGE SMART MIX (DISPOSABLE) IMPLANT
TRAY FOLEY CATH 14FR (SET/KITS/TRAYS/PACK) IMPLANT
TRAY FOLEY CATH 14FRSI W/METER (CATHETERS) IMPLANT
WATER STERILE IRR 1000ML POUR (IV SOLUTION) IMPLANT

## 2011-12-21 NOTE — Interval H&P Note (Signed)
History and Physical Interval Note:  12/21/2011 1:30 PM  [name]  has presented today for surgery, with the diagnosis of RECURRENT INFECTION RIGHT HIP  The various methods of treatment have been discussed with the patient and family. After consideration of risks, benefits and other options for treatment, the patient has consented to  Procedure(s) (LRB): IRRIGATION AND DEBRIDEMENT HIP (Right) as a surgical intervention .  The patients' history has been reviewed, patient examined, no change in status, stable for surgery.  I have reviewed the patients' chart and labs.  Questions were answered to the patient's satisfaction.     [name]

## 2011-12-21 NOTE — Transfer of Care (Signed)
Immediate Anesthesia Transfer of Care Note  Patient: [name]  Procedure(s) Performed: Procedure(s) (LRB): IRRIGATION AND DEBRIDEMENT HIP (Right)  Patient Location: PACU  Anesthesia Type: General  Level of Consciousness: awake, alert , oriented and patient cooperative  Airway & Oxygen Therapy: Patient Spontanous Breathing and Patient connected to nasal cannula oxygen  Post-op Assessment: Report given to PACU RN and Post -op Vital signs reviewed and stable  Post vital signs: Reviewed and stable  Complications: No apparent anesthesia complications

## 2011-12-21 NOTE — Brief Op Note (Signed)
12/21/2011  [place], main campus  4:57 PM  PATIENT:  [name]  66 y.o. female  PRE-OPERATIVE DIAGNOSIS:  RECURRENT INFECTION RIGHT HIP  POST-OPERATIVE DIAGNOSIS:  RECURRENT INFECTION RIGHT HIP  PROCEDURE:  Procedure(s) (LRB): IRRIGATION AND DEBRIDEMENT HIP (Right)  SURGEON:  Surgeon(s) and Role:    * [name], MD - Primary  PHYSICIAN ASSISTANT: Danae Orleans, PA-C  ANESTHESIA:   general  EBL:  Total I/O In: 600 [I.V.:600] Out: -   BLOOD ADMINISTERED:none  DRAINS: (1 medium) Hemovact drain(s) in the right hip with  Suction Open   LOCAL MEDICATIONS USED:  NONE  SPECIMEN:  Source of Specimen:  right hip  DISPOSITION OF SPECIMEN:  PATHOLOGY  COUNTS:  YES  TOURNIQUET:  * No tourniquets in log *  DICTATION: .Other Dictation: Dictation Number [id number]  PLAN OF CARE: Admit to inpatient   PATIENT DISPOSITION:  PACU - hemodynamically stable.   Delay start of Pharmacological VTE agent (>24hrs) due to surgical blood loss or risk of bleeding: no

## 2011-12-21 NOTE — Progress Notes (Signed)
ANTIBIOTIC CONSULT NOTE - INITIAL  Pharmacy Consult for Vancomycin Indication: Infected prosthesis with subsequent removal of prosthesis, S/P I&D of right hip    Allergies  Allergen Reactions  . Warfarin Sodium Other (See Comments)    Caused her to have a stroke    Patient Measurements: Height: 5\' 6"  (167.6 cm) Weight: 194 lb (87.998 kg) IBW/kg (Calculated) : 59.3    Vital Signs: Temp: 97.6 F (36.4 C) (06/07 1739) BP: 106/62 mmHg (06/07 1739) Pulse Rate: 82  (06/07 1739) Intake/Output from previous day:   Intake/Output from this shift: Total I/O In: 655 [I.V.:600; IV Piggyback:55] Out: 50 [Drains:50]  Labs:  [facility] 12/21/11 1027  WBC 10.0  HGB 11.8*  PLT 246  LABCREA --  CREATININE 2.89*   Estimated Creatinine Clearance: 22.3 ml/min (by C-G formula based on Cr of 2.89). No results found for this basename: VANCOTROUGH:2,VANCOPEAK:2,VANCORANDOM:2,GENTTROUGH:2,GENTPEAK:2,GENTRANDOM:2,TOBRATROUGH:2,TOBRAPEAK:2,TOBRARND:2,AMIKACINPEAK:2,AMIKACINTROU:2,AMIKACIN:2, in the last 72 hours   Microbiology: Recent Results (from the past 720 hour(s))  SURGICAL PCR SCREEN     Status: Abnormal   Collection Time   12/08/11  7:24 AM      Component Value Range Status Comment   MRSA, PCR POSITIVE (*) NEGATIVE  Final    Staphylococcus aureus POSITIVE (*) NEGATIVE  Final   GRAM STAIN     Status: Normal   Collection Time   12/21/11  2:30 PM      Component Value Range Status Comment   Specimen Description ABSCESS HIP RIGHT   Final    Special Requests NONE   Final    Gram Stain     Final    Value: RARE WBC PRESENT, PREDOMINANTLY MONONUCLEAR     NO ORGANISMS SEEN     CALLED TO OR 12/21/11 1525 BY K SCHULTZ   Report Status 12/21/2011 FINAL   Final     Medical History: Past Medical History  Diagnosis Date  . Fractures, stress     in both feet  . 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 Stroke   . Gout     doesn't require meds   . Anemia   . Blood transfusion    never had a reaction to blood transfusion  . Arthritis     HIP  . Cardiomyopathy   . Hypercalcemia     09/10/11  . HCAP (healthcare-associated pneumonia)     09/10/11  . CHF (congestive heart failure)   . Hypertension     takes Diltiazem daily   . Atrial fibrillation     PT TAKES ASPIRIN AS BLOOD THINNER-HX OF CEREBRAL BLEED WHILE ON COUMADIN  . Arrhythmia     takes Metoprolol daily  . H/O blood clots     bilateral legs-2wks ago  . Asthma     as a child  . Eczema   . Constipation     takes Miralax daily  . Oligouria   . Depression     takes Zoloft daily  . Sleep apnea     uses CPAP-sleep study done 2007  . Kidney disease     dialysis on M/W/F on [address]-had on 6/6 since not able to do today   } Assessment: 66 y.o. Female with ESRD (recently began hemodialysis treatments; HD qMWF)and history of infected right hip prosthesis with subsequent removal of prosthesis and implantation of antibiotic spacer. Now S/P I&D of right hip due to recurrent infection right hip. Received Cefazolin 2g IV today @ 13:45 in OR. Pharmacy to consulted for Vancomycin post op. Also the patient  was recently diagnosed with BLE DVT 12/08/11.  She has a h/o hemorrhagic CVA in 2009 while on coumadin for afib, thus not a coumadin candidate.  IVC filter placed 12/08/11.  Patient was dialyzed on 12/20/11 (Thurs) due to surgery today 12/21/11. She was taking Cipro 500mg  po once dialy prior to admission.    Goal of Therapy:  Vancomycin trough level 15-20 mcg/ml  Plan:  Vancomycin 2gm IV x1 tonight then 1gm IV after each hemodialysis  (? QMWF).  Will follow up on next scheduled dialysis.   Nicole Cella, RPh Clinical Pharmacist 12/21/2011,6:21 PM

## 2011-12-21 NOTE — Anesthesia Preprocedure Evaluation (Addendum)
Anesthesia Evaluation  Patient identified by MRN, date of birth, ID band Patient awake    Reviewed: Allergy & Precautions, H&P , NPO status , Patient's Chart, lab work & pertinent test results, reviewed documented beta blocker date and time   Airway Mallampati: II  Neck ROM: full    Dental  (+) Teeth Intact and Dental Advisory Given   Pulmonary asthma , sleep apnea ,          Cardiovascular hypertension, Pt. on medications and Pt. on home beta blockers +CHF + dysrhythmias Atrial Fibrillation     Neuro/Psych Depression CVA    GI/Hepatic   Endo/Other  obese  Renal/GU ESRF and DialysisRenal disease     Musculoskeletal  (+) Arthritis -,   Abdominal   Peds  Hematology   Anesthesia Other Findings   Reproductive/Obstetrics                          Anesthesia Physical Anesthesia Plan  ASA: III  Anesthesia Plan: General   Post-op Pain Management:    Induction: Intravenous  Airway Management Planned: Oral ETT  Additional Equipment:   Intra-op Plan:   Post-operative Plan: Extubation in OR  Informed Consent: I have reviewed the patients History and Physical, chart, labs and discussed the procedure including the risks, benefits and alternatives for the proposed anesthesia with the patient or authorized representative who has indicated his/her understanding and acceptance.   Dental advisory given  Plan Discussed with: CRNA, Surgeon and Anesthesiologist  Anesthesia Plan Comments:        Anesthesia Quick Evaluation

## 2011-12-21 NOTE — Anesthesia Procedure Notes (Signed)
Procedure Name: Intubation Date/Time: 12/21/2011 1:43 PM Performed by: Manuela Schwartz B Pre-anesthesia Checklist: Patient identified, Emergency Drugs available, Suction available, Patient being monitored and Timeout performed Patient Re-evaluated:Patient Re-evaluated prior to inductionOxygen Delivery Method: Circle system utilized Preoxygenation: Pre-oxygenation with 100% oxygen Intubation Type: IV induction and Rapid sequence Laryngoscope Size: Mac and 3 Grade View: Grade I Tube type: Oral Tube size: 7.5 mm Number of attempts: 1 Airway Equipment and Method: Stylet Placement Confirmation: ETT inserted through vocal cords under direct vision,  positive ETCO2 and breath sounds checked- equal and bilateral Secured at: 21 cm Tube secured with: Tape Dental Injury: Teeth and Oropharynx as per pre-operative assessment

## 2011-12-21 NOTE — Anesthesia Postprocedure Evaluation (Signed)
Anesthesia Post Note  Patient: [name]  Procedure(s) Performed: Procedure(s) (LRB): IRRIGATION AND DEBRIDEMENT HIP (Right)  Anesthesia type: General  Patient location: PACU  Post pain: Pain level controlled and Adequate analgesia  Post assessment: Post-op Vital signs reviewed, Patient's Cardiovascular Status Stable, Respiratory Function Stable, Patent Airway and Pain level controlled  Last Vitals:  Filed Vitals:   12/21/11 1630  BP:   Pulse: 75  Temp:   Resp: 31    Post vital signs: Reviewed and stable  Level of consciousness: awake, alert  and oriented  Complications: No apparent anesthesia complications

## 2011-12-21 NOTE — Progress Notes (Signed)
Order received from Dr Marcie Bal to give Lopressor 50 mg po now. Has been on hold past week.

## 2011-12-22 ENCOUNTER — Inpatient Hospital Stay (HOSPITAL_COMMUNITY): Payer: Medicare Other

## 2011-12-22 LAB — CBC
HCT: 29.5 % — ABNORMAL LOW (ref 36.0–46.0)
Hemoglobin: 9.4 g/dL — ABNORMAL LOW (ref 12.0–15.0)
MCH: 26.1 pg (ref 26.0–34.0)
MCHC: 31.9 g/dL (ref 30.0–36.0)
MCV: 81.9 fL (ref 78.0–100.0)
Platelets: 224 10*3/uL (ref 150–400)
RBC: 3.6 MIL/uL — ABNORMAL LOW (ref 3.87–5.11)
RDW: 16.2 % — ABNORMAL HIGH (ref 11.5–15.5)
WBC: 12.7 10*3/uL — ABNORMAL HIGH (ref 4.0–10.5)

## 2011-12-22 LAB — BASIC METABOLIC PANEL
BUN: 35 mg/dL — ABNORMAL HIGH (ref 6–23)
CO2: 24 mEq/L (ref 19–32)
Calcium: 9.6 mg/dL (ref 8.4–10.5)
Chloride: 99 mEq/L (ref 96–112)
Creatinine, Ser: 3.85 mg/dL — ABNORMAL HIGH (ref 0.50–1.10)
GFR calc Af Amer: 13 mL/min — ABNORMAL LOW (ref >90)
GFR calc non Af Amer: 11 mL/min — ABNORMAL LOW (ref >90)
Glucose, Bld: 95 mg/dL (ref 70–99)
Potassium: 3 mEq/L — ABNORMAL LOW (ref 3.5–5.1)
Sodium: 138 mEq/L (ref 135–145)

## 2011-12-22 LAB — HEPATITIS B SURFACE ANTIGEN: Hepatitis B Surface Ag: NEGATIVE

## 2011-12-22 MED ORDER — HEPARIN SODIUM (PORCINE) 1000 UNIT/ML DIALYSIS
5000.0000 [IU] | Freq: Once | INTRAMUSCULAR | Status: AC
  Administered 2011-12-22: 5000 [IU] via INTRAVENOUS_CENTRAL

## 2011-12-22 MED ORDER — DARBEPOETIN ALFA-POLYSORBATE 100 MCG/0.5ML IJ SOLN
100.0000 ug | INTRAMUSCULAR | Status: DC
  Administered 2011-12-22: 100 ug via INTRAVENOUS
  Filled 2011-12-22: qty 0.5

## 2011-12-22 MED ORDER — NA FERRIC GLUC CPLX IN SUCROSE 12.5 MG/ML IV SOLN
125.0000 mg | Freq: Once | INTRAVENOUS | Status: AC
  Administered 2011-12-22: 125 mg via INTRAVENOUS
  Filled 2011-12-22: qty 10

## 2011-12-22 MED ORDER — DARBEPOETIN ALFA-POLYSORBATE 100 MCG/0.5ML IJ SOLN
INTRAMUSCULAR | Status: AC
  Administered 2011-12-22: 100 ug via INTRAVENOUS
  Filled 2011-12-22: qty 0.5

## 2011-12-22 NOTE — Consult Note (Signed)
[facility] Renal Consultation Note  Indication for Consultation:  Management of ESRD/hemodialysis; anemia, hypertension/volume and secondary hyperparathyroidism  HPI: [name] is a 66 y.o. female with ESRD on HD on MWF at [facility] who was s/p total right hip arthroplasty who fell with periprosthetic fracture which then became infected with wound breakdown.  On 08/19/11 she underwent resection of the infected THR w/ washout and implantation of antibiotic spacer. On 3/2 she underwent I&D of same area. Recently she developed R hip pain and MRI showed a fluid collection, therefore she was brought in for I&D to rule out infection. She underwent the procedure, fluid collection was found (?purulent vs nonpurulent) and she tolerated the procedure well.   Reimplantation will be planned after her wound has healed. She is stable and without current complaints and will receive IV Cefazolin and Vancomycin and PO Cipro per Dr. Alvan Dame. She was last at [facility] 5/24-5/26 for bilateral acute lower extremity DVTs and had an IVC filter placed, since she is a poor candidate for anticoagulation, secondary to a hemorrhagic CVA in 2009 while on Coumadin for atrial fibrillation.   Past Medical History  Diagnosis Date  . Fractures, stress     in both feet  . 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 Gout     doesn't require meds   . Anemia   . Blood transfusion     never had a reaction to blood transfusion  . Arthritis     HIP  . Cardiomyopathy   . Hypercalcemia     09/10/11  . HCAP (healthcare-associated pneumonia)     09/10/11  . CHF (congestive heart failure)   . Hypertension     takes Diltiazem daily   . Atrial fibrillation     PT TAKES ASPIRIN AS BLOOD THINNER-HX OF CEREBRAL BLEED WHILE ON COUMADIN  . Arrhythmia     takes Metoprolol daily  . Asthma     as a child  . Eczema   . Constipation     takes Miralax daily  . Oligouria   . Depression     takes Zoloft daily  . Sleep  apnea     uses CPAP-sleep study done 2007  . Stroke 2009    denies residual  . ESRD (end stage renal disease) on dialysis 09/2011    dialysis on M/W/F on [address]  . Peripheral vascular disease   . DVT of lower extremity, bilateral 12/21/11    "they're there now; been there for 2 wks"   Past Surgical History  Procedure Date  . Cardiac valve surgery 1960     FOR ATRIAL SEPTAL DEFECT  . Joint replacement     right hip and right femur x 2  . I&d extremity 09/15/2011    Procedure: IRRIGATION AND DEBRIDEMENT EXTREMITY;  Surgeon: [name], MD;  Location: [place];  Service: Orthopedics;  Laterality: Right;  . Av fistula placement 09/28/2011    Procedure: ARTERIOVENOUS (AV) FISTULA CREATION;  Surgeon: Rosetta Posner, MD;  Location: [place];  Service: Vascular;  Laterality: Left;  . Cystoscopy     many yrs ago  . Colonoscopy   . Irrigation and debridement abscess 12/21/11    right hip  . Vena cava filter placement 11/2011  . Total hip arthroplasty 02/2011    right  . Femur fracture surgery 03/2011; 09/03/2011    right; "had 2, 2 wk apart in 2012; broke it again 08/2011 & had OR"  . Insertion  of dialysis catheter     Procedure: INSERTION OF DIALYSIS CATHETER;  Surgeon: Rosetta Posner, MD;  Location: [facility] OR;  Service: Vascular;  Laterality: Right;  . Cholecystectomy     [phone]   Family History  Problem Relation Age of Onset  . Diabetes Mother   . Hypertension Mother   . Hodgkin's lymphoma  32    dscd---HODGKINS DISEASE  . Cancer Father     reports that she has never smoked. She has never used smokeless tobacco. She reports that she does not drink alcohol or use illicit drugs. Allergies  Allergen Reactions  . Warfarin Sodium Other (See Comments)    Caused her to have a stroke   Prior to Admission medications   Medication Sig Start Date End Date Taking? Authorizing Provider  calcium acetate (PHOSLO) 667 MG capsule Take 667 mg by mouth 2 (two) times daily before a meal.    Yes Historical  Provider, MD  ciprofloxacin (CIPRO) 500 MG tablet Take 500 mg by mouth daily at 6 PM.   Yes Historical Provider, MD  feeding supplement (PRO-STAT SUGAR FREE 64) LIQD Take 30 mLs by mouth 2 (two) times daily.   Yes Historical Provider, MD  HYDROcodone-acetaminophen (NORCO) 7.5-325 MG per tablet Take 1 tablet by mouth every 4 (four) hours as needed. For pain   Yes Historical Provider, MD  Multiple Vitamins-Minerals (MULTIVITAMINS THER. W/MINERALS) TABS Take 1 tablet by mouth 2 (two) times daily.    Yes Historical Provider, MD  sertraline (ZOLOFT) 50 MG tablet Take 50 mg by mouth every morning. Take before breakfast   Yes Historical Provider, MD  diltiazem (CARDIZEM CD) 240 MG 24 hr capsule Take 240 mg by mouth daily.    Historical Provider, MD  diphenhydrAMINE (BENADRYL) 25 MG tablet Take 25 mg by mouth every 6 (six) hours as needed. Itching/rash    Historical Provider, MD  metoprolol (LOPRESSOR) 50 MG tablet Take 50 mg by mouth 2 (two) times daily. Hold on mornings of dialysis (M,W,F) 10/01/11 09/30/12  Ripudeep Krystal Eaton, MD  Nutritional Supplements (NEPRO PO) Take 1 Can by mouth daily.    Historical Provider, MD  polyethylene glycol (MIRALAX / GLYCOLAX) packet Take 17 g by mouth 2 (two) times daily.    Historical Provider, MD  sotalol (BETAPACE) 80 MG tablet Take 80 mg by mouth 2 (two) times daily. Takes at [place] Provider, MD   Labs:  Results for orders placed during the hospital encounter of 12/21/11 (from the past 48 hour(s))  URINALYSIS, ROUTINE W REFLEX MICROSCOPIC     Status: Abnormal   Collection Time   12/21/11 10:25 AM      Component Value Range Comment   Color, Urine AMBER (*) YELLOW  BIOCHEMICALS MAY BE AFFECTED BY COLOR   APPearance CLOUDY (*) CLEAR     Specific Gravity, Urine 1.022  1.005 - 1.030     pH 5.0  5.0 - 8.0     Glucose, UA NEGATIVE  NEGATIVE (mg/dL)    Hgb urine dipstick MODERATE (*) NEGATIVE     Bilirubin Urine SMALL (*) NEGATIVE     Ketones, ur 15 (*)  NEGATIVE (mg/dL)    Protein, ur >300 (*) NEGATIVE (mg/dL)    Urobilinogen, UA 0.2  0.0 - 1.0 (mg/dL)    Nitrite NEGATIVE  NEGATIVE     Leukocytes, UA LARGE (*) NEGATIVE    URINE MICROSCOPIC-ADD ON     Status: Abnormal   Collection Time  12/21/11 10:25 AM      Component Value Range Comment   Squamous Epithelial / LPF FEW (*) RARE     WBC, UA TOO NUMEROUS TO COUNT  <3 (WBC/hpf)    RBC / HPF 3-6  <3 (RBC/hpf)    Bacteria, UA RARE  RARE     Urine-Other LESS THAN 10 mL OF URINE SUBMITTED     CBC     Status: Abnormal   Collection Time   12/21/11 10:27 AM      Component Value Range Comment   WBC 10.0  4.0 - 10.5 (K/uL)    RBC 4.57  3.87 - 5.11 (MIL/uL)    Hemoglobin 11.8 (*) 12.0 - 15.0 (g/dL)    HCT 37.4  36.0 - 46.0 (%)    MCV 81.8  78.0 - 100.0 (fL)    MCH 25.8 (*) 26.0 - 34.0 (pg)    MCHC 31.6  30.0 - 36.0 (g/dL)    RDW 16.4 (*) 11.5 - 15.5 (%)    Platelets 246  150 - 400 (K/uL)   BASIC METABOLIC PANEL     Status: Abnormal   Collection Time   12/21/11 10:27 AM      Component Value Range Comment   Sodium 139  135 - 145 (mEq/L)    Potassium 3.2 (*) 3.5 - 5.1 (mEq/L)    Chloride 98  96 - 112 (mEq/L)    CO2 27  19 - 32 (mEq/L)    Glucose, Bld 85  70 - 99 (mg/dL)    BUN 25 (*) 6 - 23 (mg/dL)    Creatinine, Ser 2.89 (*) 0.50 - 1.10 (mg/dL)    Calcium 9.7  8.4 - 10.5 (mg/dL)    GFR calc non Af Amer 16 (*) >90 (mL/min)    GFR calc Af Amer 19 (*) >90 (mL/min)   PROTIME-INR     Status: Normal   Collection Time   12/21/11 10:27 AM      Component Value Range Comment   Prothrombin Time 13.9  11.6 - 15.2 (seconds)    INR 1.05  0.00 - 1.49    APTT     Status: Normal   Collection Time   12/21/11 10:27 AM      Component Value Range Comment   aPTT 28  24 - 37 (seconds)   TYPE AND SCREEN     Status: Normal   Collection Time   12/21/11 10:35 AM      Component Value Range Comment   ABO/RH(D) O POS      Antibody Screen NEG      Sample Expiration 12/24/2011     CULTURE, ROUTINE-ABSCESS      Status: Normal (Preliminary result)   Collection Time   12/21/11  2:30 PM      Component Value Range Comment   Specimen Description ABSCESS HIP RIGHT      Special Requests NONE      Gram Stain        Value: RARE WBC PRESENT, PREDOMINANTLY MONONUCLEAR     NO ORGANISMS SEEN     Gram Stain Report Called to,Read Back By and Verified With: Gram Stain Report Called to,Read Back By and Verified With: OR 12/21/11 1525 BY K SCHULTZ Performed at [facility]   Culture NO GROWTH 1 DAY      Report Status PENDING     ANAEROBIC CULTURE     Status: Normal (Preliminary result)   Collection Time   12/21/11  2:30 PM  Component Value Range Comment   Specimen Description ABSCESS HIP RIGHT      Special Requests NONE      Gram Stain        Value: RARE WBC PRESENT, PREDOMINANTLY MONONUCLEAR     NO ORGANISMS SEEN     Gram Stain Report Called to,Read Back By and Verified With: Gram Stain Report Called to,Read Back By and Verified With: OR 12/21/11 1525 BY SCHULTZ Performed at [facility]   Culture PENDING      Report Status PENDING     GRAM STAIN     Status: Normal   Collection Time   12/21/11  2:30 PM      Component Value Range Comment   Specimen Description ABSCESS HIP RIGHT      Special Requests NONE      Gram Stain        Value: RARE WBC PRESENT, PREDOMINANTLY MONONUCLEAR     NO ORGANISMS SEEN     CALLED TO OR 12/21/11 1525 BY K SCHULTZ   Report Status 12/21/2011 FINAL     CBC     Status: Abnormal   Collection Time   12/22/11  5:45 AM      Component Value Range Comment   WBC 12.7 (*) 4.0 - 10.5 (K/uL)    RBC 3.60 (*) 3.87 - 5.11 (MIL/uL)    Hemoglobin 9.4 (*) 12.0 - 15.0 (g/dL) DELTA CHECK NOTED   HCT 29.5 (*) 36.0 - 46.0 (%)    MCV 81.9  78.0 - 100.0 (fL)    MCH 26.1  26.0 - 34.0 (pg)    MCHC 31.9  30.0 - 36.0 (g/dL)    RDW 16.2 (*) 11.5 - 15.5 (%)    Platelets 224  150 - 400 (K/uL)   BASIC METABOLIC PANEL     Status: Abnormal   Collection Time   12/22/11  5:45 AM      Component  Value Range Comment   Sodium 138  135 - 145 (mEq/L)    Potassium 3.0 (*) 3.5 - 5.1 (mEq/L)    Chloride 99  96 - 112 (mEq/L)    CO2 24  19 - 32 (mEq/L)    Glucose, Bld 95  70 - 99 (mg/dL)    BUN 35 (*) 6 - 23 (mg/dL)    Creatinine, Ser 3.85 (*) 0.50 - 1.10 (mg/dL)    Calcium 9.6  8.4 - 10.5 (mg/dL)    GFR calc non Af Amer 11 (*) >90 (mL/min)    GFR calc Af Amer 13 (*) >90 (mL/min)    Constitutional: negative for chills, fatigue, fevers and sweats Ears, nose, mouth, throat, and face: negative for hearing loss, hoarseness, nasal congestion and sore throat Respiratory: negative for cough, dyspnea on exertion and wheezing Cardiovascular: negative for chest pain, dyspnea, orthopnea and palpitations Gastrointestinal: negative for abdominal pain, diarrhea, nausea and vomiting Genitourinary:nonoliguric Musculoskeletal:negative for arthralgias, back pain, muscle weakness and myalgias Neurological: negative for dizziness, headaches and weakness  Physical Exam: Filed Vitals:   12/22/11 1100  BP: 86/40  Pulse: 88  Temp:   Resp:      General appearance: alert, cooperative and no distress Head: Normocephalic, without obvious abnormality, atraumatic Neck: no adenopathy, no carotid bruit, no JVD and supple, symmetrical, trachea midline Resp: clear to auscultation bilaterally Cardio: regular rate and rhythm, S1, S2 normal, no murmur, click, rub or gallop GI: soft, non-tender; bowel sounds normal; no masses,  no organomegaly Extremities: extremities normal, atraumatic, no cyanosis or edema Neurologic:  Grossly normal Dialysis Access: right IJ catheter, AVF @ LUA maturing with + bruit   Dialysis Orders: Center: [facility] on MWF.  EDW 98.5 kg HD Bath 2K/2.25Ca Time 4 hrs Heparin 10,000 U. Access Right IJ catheter BFR 400 DFR 800  Zemplar 0 mcg IV/HD Epogen 4000 Units IV/HD Venofer 100 mg per HD  Assessment/Plan: 1. 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 Alvan Dame yesterday (s/p removal  of infected right hip prothesis 08/29/11), began IV Cefazolin & Vancomycin and PO Cipro. 2. ESRD -  HD on MWF at [facility], AVF placed on 3/15 with superficialization on 4/29 (both by Dr. Donnetta Hutching), now using catheter, K 3 this AM.  HD today with 4K bath. 3. Hypertension/volume  - BP low 86/40, on Metoprolol 50 mg bid & Diltiazem 240 mg qd, both held today; wt yesterday 88 kg (EDW 98.5 kg?), well below dry weight. No UF with HD today. 4. Anemia  - Hgb down to 9.4 (11.8 yesterday), on outpatient Epogen and IV Fe.  Aranesp 100 mcg today and IV Fe per HD.  5. Metabolic bone disease -  Ca 9.6, last P 5.5 on 5/22 on Phoslo 1 with meals. 6. Nutrition - Last Alb 3.4 on 5/22. 7. 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 8. History of atrial fibrillation - on Diltiazem and Sotalol, HR controlled. 9. History of right femur fx - history of pseudomonas sepsis secondary to infected joint hardware s/p removal in 08/29/11.   LYLES,CHARLES 12/22/2011, 11:28 AM   Attending Nephrologist: Roney Jaffe, MD  Patient seen and examined and agree with assessment and plan as above with additions as indicated.  Kelly Splinter  MD [employer] [phone] pgr    [phone] cell 12/22/2011, 2:40 PM

## 2011-12-22 NOTE — Progress Notes (Signed)
Patient with low BP. Asymptomatic. PA notified. Jonna Coup [facility]

## 2011-12-22 NOTE — Op Note (Signed)
[name], [name]                 ACCOUNT NO.:  [id number]  MEDICAL RECORD NO.:  [id number]  LOCATION:  5029                         FACILITY:  [facility]  PHYSICIAN:  Pietro Cassis. Alvan Dame, M.D.  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  PROCEDURE:  I and D of right hip wound excising old sinus tract.  This area of excising incision was approximately 8 inches long with subcutaneous tissue debridement, and bone debridement.  FINDINGS:  I did not see an obvious pocket of purulence.  There were certain areas that were less viable appearing and the knot, but otherwise her hip looked as good as I had seen it over the last few operations.  SURGEON:  Pietro Cassis. Alvan Dame, MD  ASSISTANT:  Jane Orleans, PA-C.  Please note that Mr. [name] was present for the entirety of the case, critical for management of the operative extremity, retractor management and as well as primary wound closure.  ANESTHESIA:  General.  SPECIMENS:  I did send joint fluid from the hip joint for aerobic and anaerobic evaluation.  BLOOD LOSS:  Approximately 300 mL.  COMPLICATIONS:  None.  DRAINS:  One medium Hemovac was placed at the right hip area.  INDICATIONS FOR PROCEDURE:  [name] is a 66 year old female, patient of mine with a history of an intact right total hip replacement complicated by periprosthetic fracture, required open reduction and internal fixation and thus, this was complicated by a new fracture after fall requiring more extensive open reduction and internal fixation. Unfortunately, she developed a nonhealing wound based on her lower extremity  sites with chronic drain, developed infection.  I attempt to try to salvage this with an infected nonunion of the right hip.  This required resection arthroplasty, removal of all other hardware, and antibiotic spacer.  She has had delayed healing of the wound with recurrence of an open wound, requiring repeat I and D in the past.  At this point in terms of preparation of trying to get her a new hip replacement, we wished to better evaluate the wound and clean it up prior to procedure.  Consent was obtained.  PROCEDURE IN DETAIL:  The patient was brought to the operative theater. Once adequate anesthesia was established, she was positioned in the left lateral decubitus position with right side up.  The right lower extremity was prepped and draped in sterile fashion.  She received Ancef preoperatively.  Time-out was performed identifying the patient, planned procedure, and extremity.  Once the portion of the excision was utilized, I excised an old sinus tract and nonhealing wound.  I have started a circumcision through the iliotibial band.  As I did this, the subcutaneous tissue was debrided, soft tissue debrided down to bone and including some bone debridement.  I tried to identify an anatomy, had difficult time identifying the proximal femur based on the fragmentation, contracture  and scarring.  I at this point find an obvious purulent pocket.  I debrided it back and found some areas of open areas that did not have any purulence, but had some fluid collection.  I irrigated her hip out with 9 liters of normal saline solution at this point and following this debridement, I placed a medium Hemovac drain deep, reapproximated the iliotibial band and gluteal fascia using #1 PDS, 2-0 Vicryl in the subcu tissue and staples on the skin.  The skin was cleaned, dried and dressed sterilely.  With a Mepilex dressing, the drain site dressed separately.  I reviewed the findings with her family.   We will order a CT scan while she is here to better evaluate the proximal femoral anatomy and what her bone is.  The plan will be for hospitalization and possible reimplantation once she has healed her wound.  She should be contact a nephrology for management of her renal failure and dialysis while she is in the hospital.     Pietro Cassis. Alvan Dame, M.D.     MDO/MEDQ  D:  12/21/2011  T:  12/22/2011  Job:  [id number]

## 2011-12-22 NOTE — Progress Notes (Signed)
Afebrile.  No culture report as yet. Hgb 9.4, down from 11.8 yesterday.

## 2011-12-22 NOTE — Progress Notes (Signed)
PT Cancellation Note  Eval cancelled today due to patient receiving procedure or test.  Pt in HD.  PT to f/u tomorrow.  Lorriane Shire 12/22/2011, 2:05 PM

## 2011-12-23 LAB — BASIC METABOLIC PANEL
BUN: 14 mg/dL (ref 6–23)
CO2: 29 mEq/L (ref 19–32)
Calcium: 9.8 mg/dL (ref 8.4–10.5)
Chloride: 105 mEq/L (ref 96–112)
Creatinine, Ser: 2.3 mg/dL — ABNORMAL HIGH (ref 0.50–1.10)
GFR calc Af Amer: 25 mL/min — ABNORMAL LOW (ref >90)
GFR calc non Af Amer: 21 mL/min — ABNORMAL LOW (ref >90)
Glucose, Bld: 88 mg/dL (ref 70–99)
Potassium: 3.8 mEq/L (ref 3.5–5.1)
Sodium: 140 mEq/L (ref 135–145)

## 2011-12-23 LAB — CBC
HCT: 25.6 % — ABNORMAL LOW (ref 36.0–46.0)
Hemoglobin: 8.3 g/dL — ABNORMAL LOW (ref 12.0–15.0)
MCH: 26.3 pg (ref 26.0–34.0)
MCHC: 32.4 g/dL (ref 30.0–36.0)
MCV: 81 fL (ref 78.0–100.0)
Platelets: 166 10*3/uL (ref 150–400)
RBC: 3.16 MIL/uL — ABNORMAL LOW (ref 3.87–5.11)
RDW: 16.4 % — ABNORMAL HIGH (ref 11.5–15.5)
WBC: 10.4 10*3/uL (ref 4.0–10.5)

## 2011-12-23 MED ORDER — SODIUM CHLORIDE 0.9 % IV SOLN
125.0000 mg | INTRAVENOUS | Status: DC
  Administered 2011-12-24: 125 mg via INTRAVENOUS
  Filled 2011-12-23 (×2): qty 10

## 2011-12-23 MED ORDER — CAMPHOR-MENTHOL 0.5-0.5 % EX LOTN
TOPICAL_LOTION | CUTANEOUS | Status: DC | PRN
  Filled 2011-12-23: qty 222

## 2011-12-23 MED ORDER — DARBEPOETIN ALFA-POLYSORBATE 100 MCG/0.5ML IJ SOLN: 100.0000 ug | INTRAMUSCULAR | Status: DC

## 2011-12-23 MED ORDER — HEPARIN SODIUM (PORCINE) 1000 UNIT/ML DIALYSIS
5000.0000 [IU] | Freq: Once | INTRAMUSCULAR | Status: AC
  Administered 2011-12-24: 5000 [IU] via INTRAVENOUS_CENTRAL
  Filled 2011-12-23 (×2): qty 5

## 2011-12-23 NOTE — Evaluation (Signed)
Physical Therapy Evaluation Patient Details Name: [name] MRN: [id number] DOB: February 01, 1946 Today's Date: 12/23/2011 Time: [id number] PT Time Calculation (min): 26 min  PT Assessment / Plan / Recommendation Clinical Impression  Pt is a 66 y.o. female who has underwent multiple I & Ds of R hip since hardware removal in Feb 2013.  Pt is awaiting new THA after wound healing.  Skilled PT intervention indicated to progress to max functional level and provide education.    PT Assessment  Patient needs continued PT services    Follow Up Recommendations  Skilled nursing facility    Barriers to Discharge        lEquipment Recommendations  None recommended by PT    Recommendations for Other Services     Frequency Min 3X/week    Precautions / Restrictions Restrictions RLE Weight Bearing: Non weight bearing   Pertinent Vitals/Pain 5/10      Mobility  Bed Mobility Bed Mobility: Supine to Sit Supine to Sit: 4: Min assist;With rails;HOB elevated Details for Bed Mobility Assistance: assist with RLE Transfers Transfers: Lateral/Scoot Transfers Lateral/Scoot Transfers: 4: Min assist;With Warehouse manager;With armrests removed Details for Transfer Assistance: transfer toward L into w/c with removeable armrest    Exercises     PT Diagnosis: Generalized weakness;Acute pain;Difficulty walking  PT Problem List: Decreased activity tolerance;Decreased mobility;Pain;Obesity PT Treatment Interventions: Functional mobility training;Therapeutic activities;Patient/family education;Wheelchair mobility training;Therapeutic exercise   PT Goals Acute Rehab PT Goals PT Goal Formulation: With patient Time For Goal Achievement: 12/30/11 Potential to Achieve Goals: Good Pt will go Supine/Side to Sit: with modified independence;with rail PT Goal: Supine/Side to Sit - Progress: Goal set today Pt will go Sit to Supine/Side: with modified independence;with rail PT Goal: Sit to Supine/Side - Progress: Goal  set today Pt will Transfer Bed to Chair/Chair to Bed: with supervision (with sliding board) PT Transfer Goal: Bed to Chair/Chair to Bed - Progress: Goal set today Pt will Propel Wheelchair: 10 - 50 feet;with modified independence PT Goal: Propel Wheelchair - Progress: Goal set today  Visit Information  Last PT Received On: 12/23/11 Assistance Needed: +1    Subjective Data  Subjective: "I did pretty good." Patient Stated Goal: get better   Prior Functioning  Home Living Available Help at Discharge: [place] Type of Home[place] Communication Communication: No difficulties    Cognition  Overall Cognitive Status: Appears within functional limits for tasks assessed/performed Arousal/Alertness: Awake/alert Orientation Level: Appears intact for tasks assessed Behavior During Session: [facility] for tasks performed    Extremity/Trunk Assessment     Balance    End of Session PT - End of Session Activity Tolerance: Patient tolerated treatment well Patient left: in chair Nurse Communication: Mobility status;Precautions;Weight bearing status;Other (comment) (use of slide board to transfer w/c-->bed toward L side)   Lorriane Shire 12/23/2011, 2:48 PM  Lorrin Goodell, PT  Office # [phone] Pager [phone]

## 2011-12-23 NOTE — Progress Notes (Signed)
Subjective: Drain in Place and will have Dialysis in the A.M. Cultures thus far show no growth.Hbg 8.3   Objective: Vital signs in last 24 hours: Temp:  [97.5 F (36.4 C)-98.3 F (36.8 C)] 98.3 F (36.8 C) (06/09 0616) Pulse Rate:  [82-110] 82  (06/09 0616) Resp:  [16-18] 16  (06/09 0616) BP: (76-116)/(40-74) 116/66 mmHg (06/09 0616) SpO2:  [92 %-98 %] 92 % (06/09 0616) Weight:  [98.3 kg (216 lb 11.4 oz)-98.5 kg (217 lb 2.5 oz)] 98.5 kg (217 lb 2.5 oz) (06/08 1815)  Intake/Output from previous day: 06/08 0701 - 06/09 0700 In: 480 [P.O.:360; I.V.:120] Out: -570 [Drains:100] Intake/Output this shift:     Basename 12/23/11 0635 12/22/11 0545 12/21/11 1027  HGB 8.3* 9.4* 11.8*    Basename 12/23/11 0635 12/22/11 0545  WBC 10.4 12.7*  RBC 3.16* 3.60*  HCT 25.6* 29.5*  PLT 166 224    Basename 12/23/11 0635 12/22/11 0545  NA 140 138  K 3.8 3.0*  CL 105 99  CO2 29 24  BUN 14 35*  CREATININE 2.30* 3.85*  GLUCOSE 88 95  CALCIUM 9.8 9.6    Basename 12/21/11 1027  LABPT --  INR 1.05    Neurovascular intact Dorsiflexion/Plantar flexion intact  Assessment/Plan: Awaiting Dr. Aurea Graff final decision on DC.   [name] A 12/23/2011, 10:14 AM

## 2011-12-23 NOTE — Progress Notes (Signed)
ANTIBIOTIC CONSULT NOTE - FOLLOW UP  Pharmacy Consult for Vancomycin  Indication: Infected prosthesis with subsequent removal of prosthesis, S/P I&D of right hip  Assessment: 66 y.o. Female with ESRD (recently began hemodialysis treatments; HD qMWF)and history of infected right hip prosthesis with subsequent removal of prosthesis and implantation of antibiotic spacer. Now S/P I&D of right hip due to recurrent infection right hip.  Patient continues on vancomycin day #2 with plans for next HD session tomorrow. No fevers have been noted, wbc is stable at 10. Hip abscess culture is still pending with no bacterial growth to date. Will continue vancomycin dosing post-HD. Given newly dx HD will monitor toleration of HD and adjust vanc as warranted.  Goal of Therapy:  Vancomycin trough pre-hd - 15-25  Plan:  Vancomycin 1g q hd - mwf Follow cx data  Vital Signs: Temp: 98.3 F (36.8 C) (06/09 0616) Temp src: Oral (06/09 0616) BP: 116/66 mmHg (06/09 0616) Pulse Rate: 82  (06/09 0616) Intake/Output from previous day: 06/08 0701 - 06/09 0700 In: 480 [P.O.:360; I.V.:120] Out: -570 [Drains:100]  Labs:  [facility] 12/23/11 0635 12/22/11 0545 12/21/11 1027  WBC 10.4 12.7* 10.0  HGB 8.3* 9.4* 11.8*  PLT 166 224 246  LABCREA -- -- --  CREATININE 2.30* 3.85* 2.89*   Estimated Creatinine Clearance: 29.6 ml/min (by C-G formula based on Cr of 2.3). No results found for this basename: VANCOTROUGH:2,VANCOPEAK:2,VANCORANDOM:2,GENTTROUGH:2,GENTPEAK:2,GENTRANDOM:2,TOBRATROUGH:2,TOBRAPEAK:2,TOBRARND:2,AMIKACINPEAK:2,AMIKACINTROU:2,AMIKACIN:2, in the last 72 hours   Microbiology: Recent Results (from the past 720 hour(s))  SURGICAL PCR SCREEN     Status: Abnormal   Collection Time   12/08/11  7:24 AM      Component Value Range Status Comment   MRSA, PCR POSITIVE (*) NEGATIVE  Final    Staphylococcus aureus POSITIVE (*) NEGATIVE  Final   CULTURE, ROUTINE-ABSCESS     Status: Normal (Preliminary result)    Collection Time   12/21/11  2:30 PM      Component Value Range Status Comment   Specimen Description ABSCESS HIP RIGHT   Final    Special Requests NONE   Final    Gram Stain     Final    Value: RARE WBC PRESENT, PREDOMINANTLY MONONUCLEAR     NO ORGANISMS SEEN     Gram Stain Report Called to,Read Back By and Verified With: Gram Stain Report Called to,Read Back By and Verified With: OR 12/21/11 1525 BY K SCHULTZ Performed at [facility]   Culture NO GROWTH 2 DAYS   Final    Report Status PENDING   Incomplete   ANAEROBIC CULTURE     Status: Normal (Preliminary result)   Collection Time   12/21/11  2:30 PM      Component Value Range Status Comment   Specimen Description ABSCESS HIP RIGHT   Final    Special Requests NONE   Final    Gram Stain     Final    Value: RARE WBC PRESENT, PREDOMINANTLY MONONUCLEAR     NO ORGANISMS SEEN     Gram Stain Report Called to,Read Back By and Verified With: Gram Stain Report Called to,Read Back By and Verified With: OR 12/21/11 1525 BY SCHULTZ Performed at [facility]   Culture     Final    Value: NO ANAEROBES ISOLATED; CULTURE IN PROGRESS FOR 5 DAYS   Report Status PENDING   Incomplete   GRAM STAIN     Status: Normal   Collection Time   12/21/11  2:30 PM  Component Value Range Status Comment   Specimen Description ABSCESS HIP RIGHT   Final    Special Requests NONE   Final    Gram Stain     Final    Value: RARE WBC PRESENT, PREDOMINANTLY MONONUCLEAR     NO ORGANISMS SEEN     CALLED TO OR 12/21/11 1525 BY K SCHULTZ   Report Status 12/21/2011 FINAL   Final     Anti-infectives     Start     Dose/Rate Route Frequency Ordered Stop   12/24/11 1200   vancomycin (VANCOCIN) IVPB 1000 mg/200 mL premix        1,000 mg 200 mL/hr over 60 Minutes Intravenous Every M-W-F (Hemodialysis) 12/21/11 1852     12/22/11 1800   ciprofloxacin (CIPRO) tablet 500 mg        500 mg Oral Daily-1800 12/21/11 1805     12/21/11 2000   vancomycin (VANCOCIN) 2,000 mg  in sodium chloride 0.9 % 500 mL IVPB        2,000 mg 250 mL/hr over 120 Minutes Intravenous  Once 12/21/11 1851 12/22/11 0013   12/21/11 1452   gentamycin 80 mg in 0.9% normal saline 250 mL irrigation  Status:  Discontinued          As needed 12/21/11 1453 12/21/11 1611   12/21/11 1451   vancomycin (VANCOCIN) powder  Status:  Discontinued          As needed 12/21/11 1451 12/21/11 1611   12/21/11 1415   vancomycin (VANCOCIN) powder 2,000 mg  Status:  Discontinued        2,000 mg Other To Surgery 12/21/11 1406 12/21/11 1805   12/21/11 1025   ceFAZolin (ANCEF) 2-3 GM-% IVPB SOLR     Comments: Darlin Drop: cabinet override         12/21/11 1025 12/21/11 2229   12/21/11 1019   ceFAZolin (ANCEF) IVPB 2 g/50 mL premix        2 g 100 mL/hr over 30 Minutes Intravenous 60 min pre-op 12/21/11 1019 12/21/11 Homer, Marlaine Hind 12/23/2011,10:55 AM

## 2011-12-23 NOTE — Progress Notes (Signed)
Subjective:  Mild right hip pain; otherwise, no complaints.  Objective: Vital signs in last 24 hours: Temp:  [97.5 F (36.4 C)-98.3 F (36.8 C)] 98.3 F (36.8 C) (06/09 0616) Pulse Rate:  [82-110] 82  (06/09 0616) Resp:  [16-18] 16  (06/09 0616) BP: (76-116)/(40-74) 116/66 mmHg (06/09 0616) SpO2:  [92 %-98 %] 92 % (06/09 0616) Weight:  [98.3 kg (216 lb 11.4 oz)-98.5 kg (217 lb 2.5 oz)] 98.5 kg (217 lb 2.5 oz) (06/08 1815) Weight change: 10.302 kg (22 lb 11.4 oz)  Intake/Output from previous day: 06/08 0701 - 06/09 0700 In: 480 [P.O.:360; I.V.:120] Out: -570 [Drains:100]   EXAM: General appearance:  Alert, in no apparent distress Resp:  CTA bilaterally Cardio:  RRR without murmur GI:  + BS, soft and nontender Extremities:  No edema Access:  right IJ catheter, AVF @ LUA maturing with + bruit  Lab Results:  Basename 12/23/11 0635 12/22/11 0545  WBC 10.4 12.7*  HGB 8.3* 9.4*  HCT 25.6* 29.5*  PLT 166 224   BMET:  Basename 12/22/11 0545 12/21/11 1027  NA 138 139  K 3.0* 3.2*  CL 99 98  CO2 24 27  GLUCOSE 95 85  BUN 35* 25*  CREATININE 3.85* 2.89*  CALCIUM 9.6 9.7  ALBUMIN -- --   No results found for this basename: PTH:2 in the last 72 hours Iron Studies: No results found for this basename: IRON,TIBC,TRANSFERRIN,FERRITIN in the last 72 hours  Assessment/Plan: 1. 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 Alvan Dame on 6/7 (s/p removal of infected right hip prothesis 08/29/11), began IV Cefazolin & Vancomycin and PO Cipro.  2. ESRD - HD on MWF at [facility], AVF placed on 3/15 with superficialization on 4/29 (both by Dr. Donnetta Hutching), now using catheter, K 3 pre-HD with 4K bath yesterday.  Next HD tomorrow to resume regular schedule.  3. Hypertension/volume - BP 116/66, on Metoprolol 50 mg bid & Diltiazem 240 mg qd, both held yesterday pre-HD; post-HD wt 98.5 kg yesterday (EDW 98.5 kg). 4. Anemia - Hgb down to 8.3 (11.8 on 6/7), on outpatient Epogen and IV Fe, s/p Aranesp  100 mcg yesterday and IV Fe per HD. Recheck pre-HD tomorrow. 5. Metabolic bone disease - Ca 9.6, last P 4.7 on Phoslo 1 with meals.  6. Nutrition - Last Alb 2.2.  7. 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  8. History of atrial fibrillation - on Diltiazem and Sotalol, HR controlled.  9. History of right femur fx - history of pseudomonas sepsis secondary to infected joint hardware s/p removal in 08/29/11.   LOS: 2 days   LYLES,CHARLES 12/23/2011,7:56 AM  Patient seen and examined and agree with assessment and plan as above.  Kelly Splinter  MD [employer] [phone] pgr    [phone] cell 12/23/2011, 2:32 PM

## 2011-12-24 ENCOUNTER — Encounter (HOSPITAL_COMMUNITY): Payer: Self-pay | Admitting: Orthopedic Surgery

## 2011-12-24 ENCOUNTER — Other Ambulatory Visit (HOSPITAL_COMMUNITY): Payer: Medicare Other

## 2011-12-24 ENCOUNTER — Inpatient Hospital Stay (HOSPITAL_COMMUNITY): Payer: Medicare Other

## 2011-12-24 DIAGNOSIS — L02415 Cutaneous abscess of right lower limb: Secondary | ICD-10-CM

## 2011-12-24 LAB — CULTURE, ROUTINE-ABSCESS: Culture: NO GROWTH

## 2011-12-24 LAB — CBC
HCT: 24.8 % — ABNORMAL LOW (ref 36.0–46.0)
Hemoglobin: 8 g/dL — ABNORMAL LOW (ref 12.0–15.0)
MCH: 25.8 pg — ABNORMAL LOW (ref 26.0–34.0)
MCHC: 32.3 g/dL (ref 30.0–36.0)
MCV: 80 fL (ref 78.0–100.0)
Platelets: 177 10*3/uL (ref 150–400)
RBC: 3.1 MIL/uL — ABNORMAL LOW (ref 3.87–5.11)
RDW: 17.3 % — ABNORMAL HIGH (ref 11.5–15.5)
WBC: 9.9 10*3/uL (ref 4.0–10.5)

## 2011-12-24 LAB — RENAL FUNCTION PANEL
Albumin: 2 g/dL — ABNORMAL LOW (ref 3.5–5.2)
BUN: 26 mg/dL — ABNORMAL HIGH (ref 6–23)
CO2: 28 mEq/L (ref 19–32)
Calcium: 13.7 mg/dL (ref 8.4–10.5)
Chloride: 101 mEq/L (ref 96–112)
Creatinine, Ser: 3.54 mg/dL — ABNORMAL HIGH (ref 0.50–1.10)
GFR calc Af Amer: 15 mL/min — ABNORMAL LOW (ref >90)
GFR calc non Af Amer: 13 mL/min — ABNORMAL LOW (ref >90)
Glucose, Bld: 98 mg/dL (ref 70–99)
Phosphorus: 2.9 mg/dL (ref 2.3–4.6)
Potassium: 3.6 mEq/L (ref 3.5–5.1)
Sodium: 137 mEq/L (ref 135–145)

## 2011-12-24 IMAGING — CT CT HIP*R* W/O CM
2 of 4 series · 16 of 46 positions shown, 18 images · non-contrast
Comparison: Plain films [DATE] and [DATE].
COMPARISON: None.

CLINICAL DATA: History of right hip replacement with subsequent
periprosthetic fracture which required open fixation.  The patient
then developed a nonhealing wound and infected nonunion of the
right hip. Hardware has been removed.

CT RIGHT HIP WITHOUT CONTRAST
TECHNIQUE: Multidetector CT imaging of the right hip was performed
according to the standard protocol without intravenous contrast.
Multiplanar CT image reconstructions were also generated.
periprosthetic fracture which requiring open fixation.  The patient
3-DIMENSIONAL CT IMAGE RENDERING AT INDEPENDENT WORKSTATION:
TECHNIQUE: 3-dimensional CT images were rendered by post-
processing of the original CT data at independent workstation.  The
3-dimensional CT images were interpreted, and findings were
reported in the accompanying complete CT report for this study.

[Series 6: hip 2.0 b40f · axial · 0.45mm/px · z∈[-221,+61]mm · 13 of 163 slices shown, 15 images]
[im 11/163  soft-tissue]
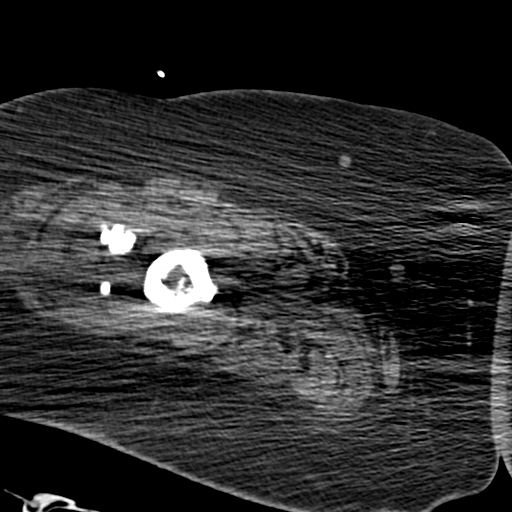
[im 11/163  bone]
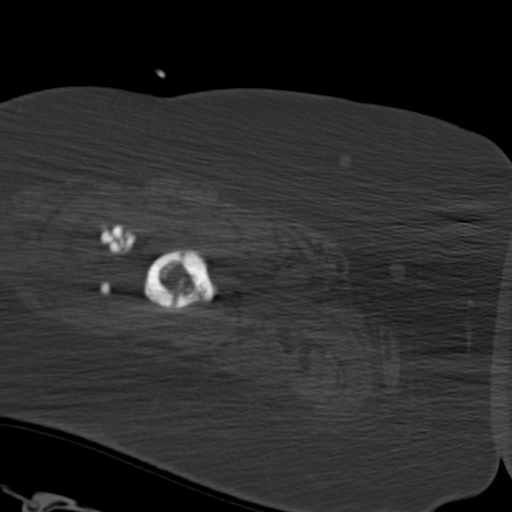
[im 21/163  soft-tissue]
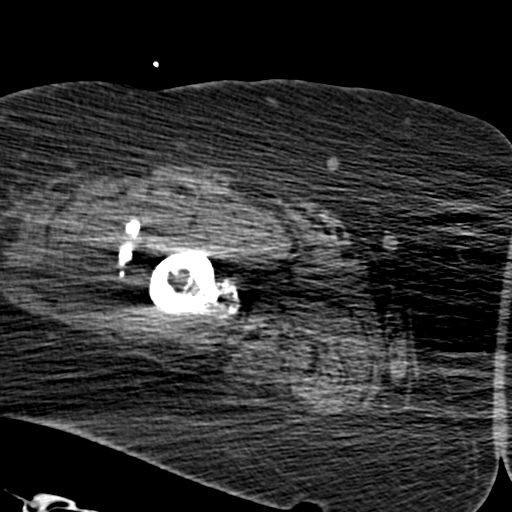
[im 31/163  soft-tissue]
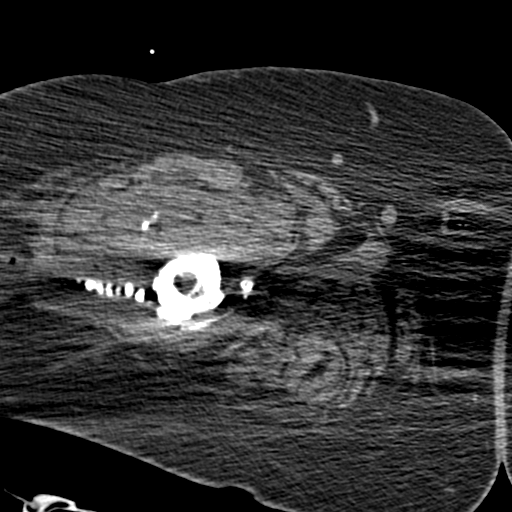
[im 51/163  soft-tissue]
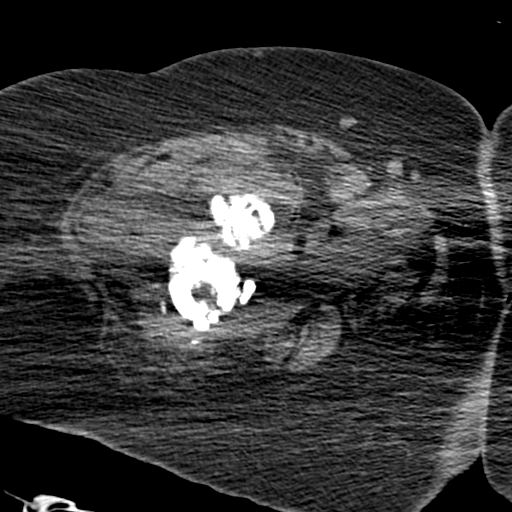
[im 61/163  soft-tissue]
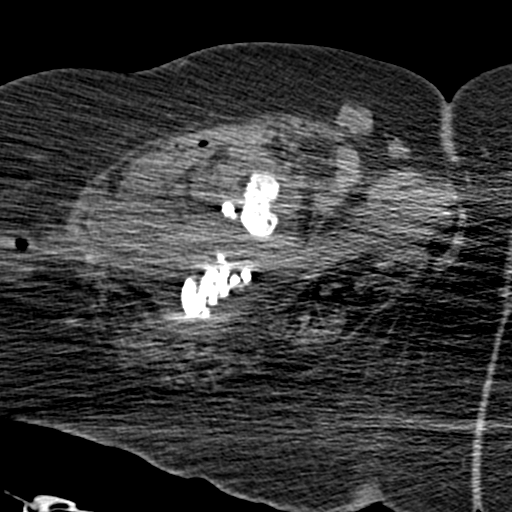
[im 71/163  soft-tissue]
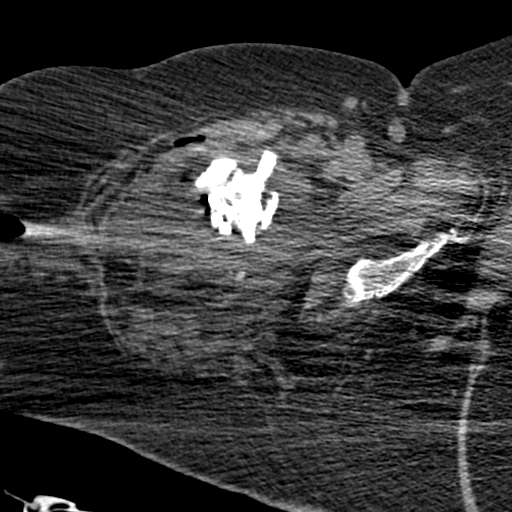
[im 82/163  soft-tissue]
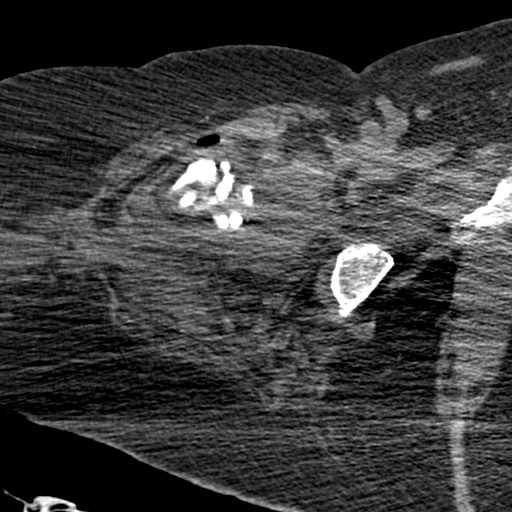
[im 92/163  soft-tissue]
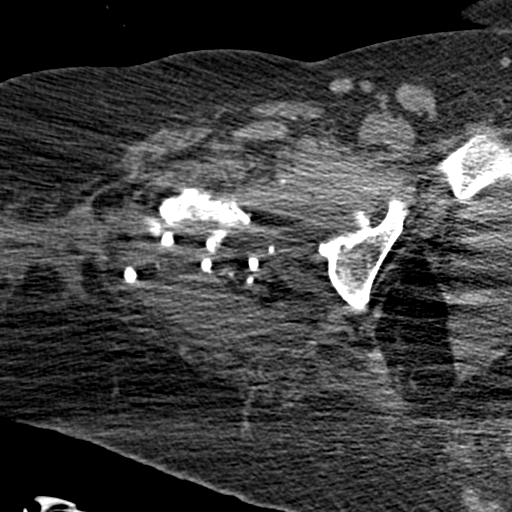
[im 102/163  soft-tissue]
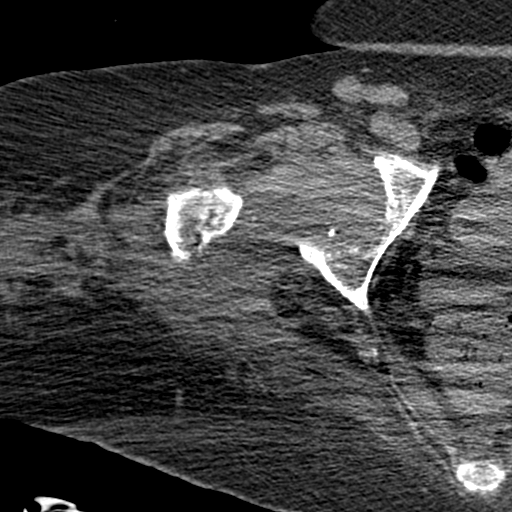
[im 102/163  bone]
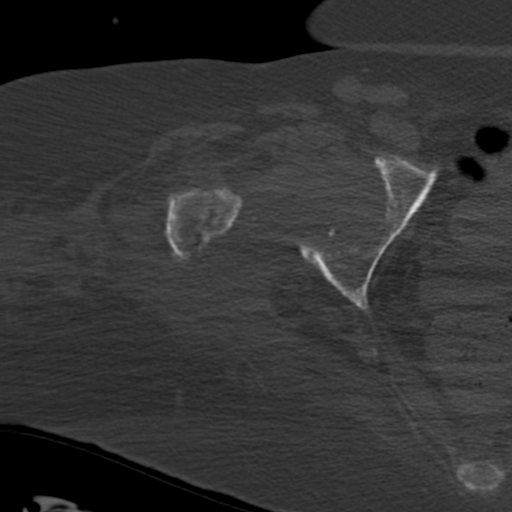
[im 112/163  soft-tissue]
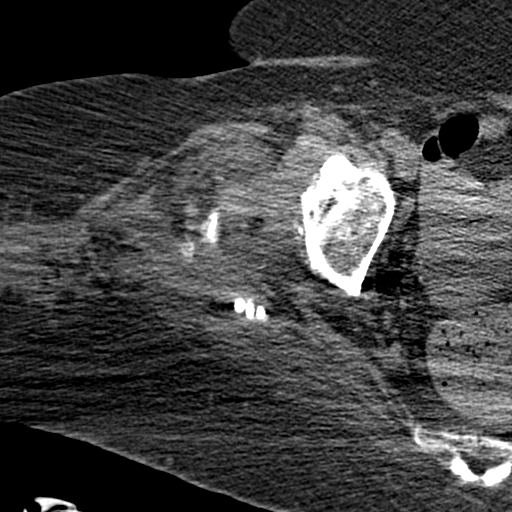
[im 132/163  soft-tissue]
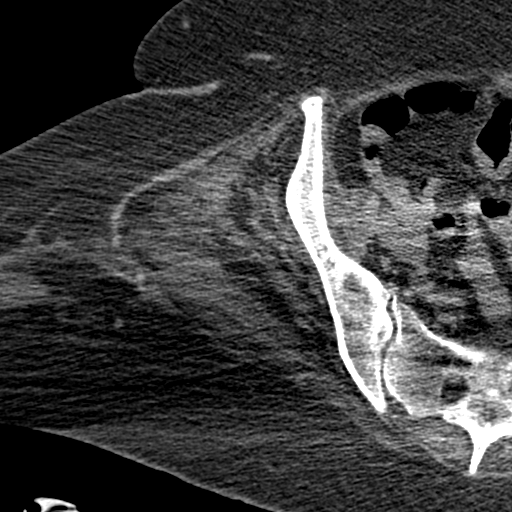
[im 142/163  soft-tissue]
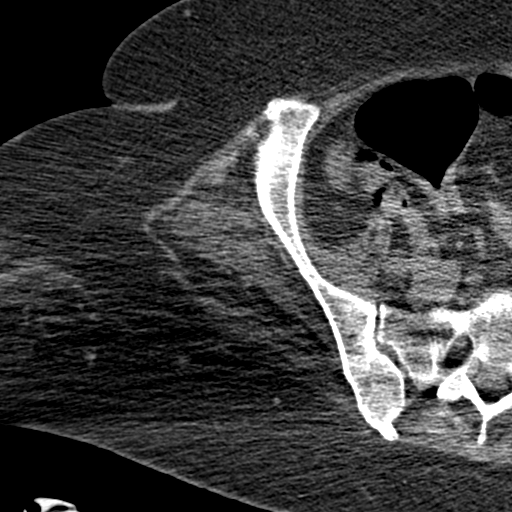
[im 152/163  soft-tissue]
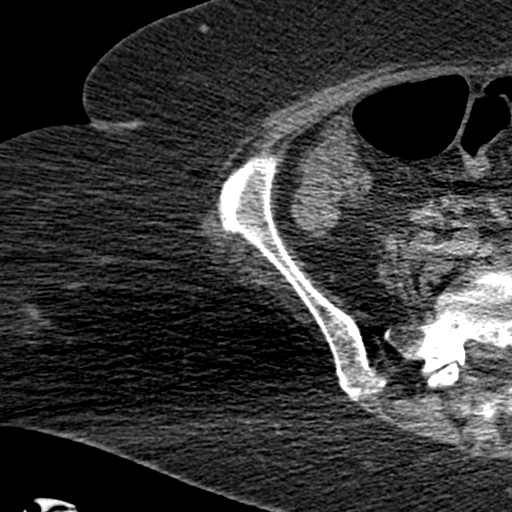

[Series 603: coronal · coronal · 0.64mm/px · 3 of 81 slices shown]
[im 27/81  soft-tissue]
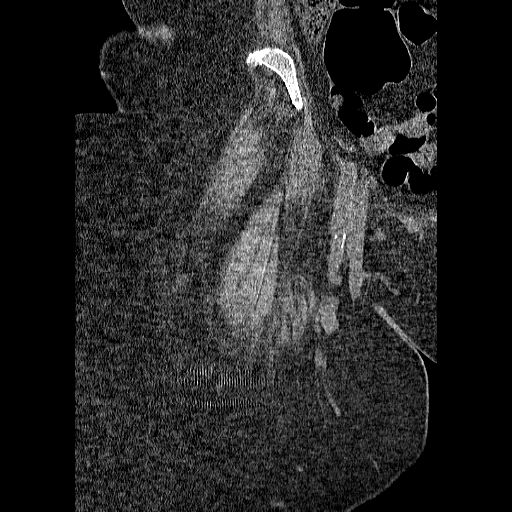
[im 36/81  soft-tissue]
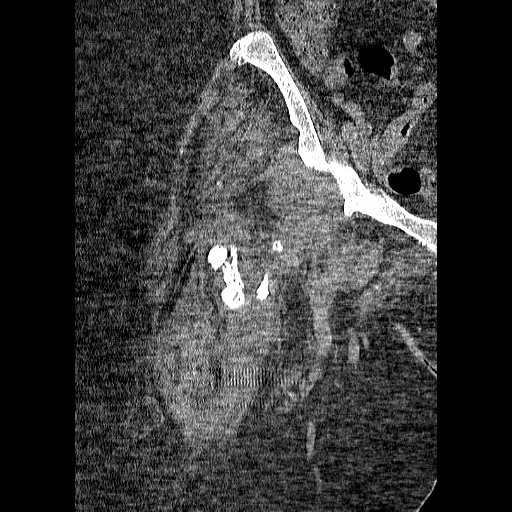
[im 45/81  soft-tissue]
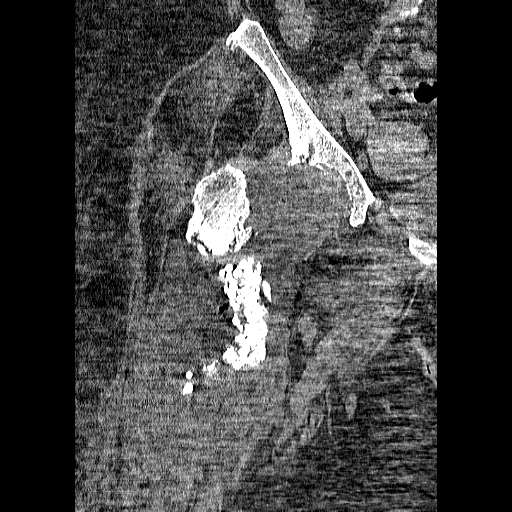

[16 of 46 positions shown; findings below may reference images not displayed]

FINDINGS: Antibiotic impregnated spacer seen on the most recent
plain films has been removed.  Multiple antibiotic beads are in
place.  Resection of the femoral head and  proximal femoral
diaphysis is seen.  Osteotomy defect of the femur is located 11 cm
below the top of the greater trochanter which remains intact.
There is some heterotopic ossification present anteriorly in the
right thigh but there is no bridging bone between the greater
trochanter remnant and the femoral diaphysis.  Periosteal new bone
formation about the diaphysis of the right femur is identified
which could be due to chronic osteomyelitis and/or postoperative
change.

Soft tissue structures demonstrate some gas present laterally and
along the proximal diaphysis consistent with recent surgery on
[DATE].  No focal fluid collection to suggest abscess is
identified.
IMPRESSION: Postoperative change of the right hip and femur as described.
Heterotopic ossification along the anterior aspect of the right
thigh is again noted but there is no bridging bone between the
right greater trochanter diaphysis of the femur.  Antibiotic spacer
has been removed with multiple antibiotic impregnated beads in
place.
FINDINGS: Surface rendered 3-D imaging confirms the above
findings.
IMPRESSION: As above.

## 2011-12-24 MED ORDER — DARBEPOETIN ALFA-POLYSORBATE 100 MCG/0.5ML IJ SOLN: 100.0000 ug | INTRAMUSCULAR | Status: DC

## 2011-12-24 MED ORDER — VANCOMYCIN HCL 1000 MG IV SOLR
1250.0000 mg | INTRAVENOUS | Status: AC
  Administered 2011-12-24: 1250 mg via INTRAVENOUS
  Filled 2011-12-24: qty 1250

## 2011-12-24 MED ORDER — SOTALOL HCL 80 MG PO TABS: 80.0000 mg | ORAL_TABLET | ORAL | Status: DC

## 2011-12-24 MED ORDER — VANCOMYCIN HCL IN DEXTROSE 1-5 GM/200ML-% IV SOLN: 1000.0000 mg | INTRAVENOUS | Status: DC

## 2011-12-24 NOTE — Progress Notes (Signed)
Clinical Social Work  CSW called facility [facility]) and updated them on patient's [employer]. SNF is agreeable to accept patient when medically dc. CSW will continue to follow to assist with dc needs.  [place], [place] [phone] (Coverage for Kendell Bane)

## 2011-12-24 NOTE — Progress Notes (Signed)
CRITICAL VALUE ALERT  Critical value received:  Calcium 13.7  Date of notification:  12/24/11  Time of notification:  1100  Critical value read back:yes  Nurse who received alert:  Lorrin Goodell  MD notified (1st page):  Dr Justin Mend  Time of first page:  1101  MD notified (2nd page):  Time of second page:  Responding MD:  Dr Justin Mend  Time MD responded:  1104

## 2011-12-24 NOTE — Progress Notes (Signed)
[facility] ROUNDING NOTE   Subjective:   Interval History: none  Objective:  Vital signs in last 24 hours:  Temp:  [97.9 F (36.6 C)-98.4 F (36.9 C)] 97.9 F (36.6 C) (06/10 0534) Pulse Rate:  [88-111] 88  (06/10 0534) Resp:  [16-20] 16  (06/10 0534) BP: (104-134)/(48-70) 128/70 mmHg (06/10 0534) SpO2:  [95 %-100 %] 95 % (06/10 0534)  Weight change:  Filed Weights   12/21/11 1031 12/22/11 1355 12/22/11 1815  Weight: 87.998 kg (194 lb) 98.3 kg (216 lb 11.4 oz) 98.5 kg (217 lb 2.5 oz)    Intake/Output: I/O last 3 completed shifts: In: 680 [P.O.:560; I.V.:120] Out: 300 [Drains:300]   Intake/Output this shift:     General appearance: Alert, in no apparent distress  Resp: CTA bilaterally  Cardio: RRR without murmur  GI: + BS, soft and nontender  Extremities: No edema  Access: right IJ catheter, AVF @ LUA maturing with + bruit    Basic Metabolic Panel:  Lab [id number] 12/22/11 0545 12/21/11 1027  NA 140 138 139  K 3.8 3.0* 3.2*  CL 105 99 98  CO2 29 24 27   GLUCOSE 88 95 85  BUN 14 35* 25*  CREATININE 2.30* 3.85* 2.89*  CALCIUM 9.8 9.6 9.7  MG -- -- --  PHOS -- -- --    Liver Function Tests: No results found for this basename: AST:5,ALT:5,ALKPHOS:5,BILITOT:5,PROT:5,ALBUMIN:5 in the last 168 hours No results found for this basename: LIPASE:5,AMYLASE:5 in the last 168 hours No results found for this basename: AMMONIA:3 in the last 168 hours  CBC:  Lab 12/23/11 0635 12/22/11 0545 12/21/11 1027  WBC 10.4 12.7* 10.0  NEUTROABS -- -- --  HGB 8.3* 9.4* 11.8*  HCT 25.6* 29.5* 37.4  MCV 81.0 81.9 81.8  PLT 166 224 246    Cardiac Enzymes: No results found for this basename: CKTOTAL:5,CKMB:5,CKMBINDEX:5,TROPONINI:5 in the last 168 hours  BNP: No components found with this basename: POCBNP:5  CBG: No results found for this basename: GLUCAP:5 in the last 168 hours  Microbiology: Results for orders placed during the hospital encounter  of 12/21/11  CULTURE, ROUTINE-ABSCESS     Status: Normal   Collection Time   12/21/11  2:30 PM      Component Value Range Status Comment   Specimen Description ABSCESS HIP RIGHT   Final    Special Requests NONE   Final    Gram Stain     Final    Value: RARE WBC PRESENT, PREDOMINANTLY MONONUCLEAR     NO ORGANISMS SEEN     Gram Stain Report Called to,Read Back By and Verified With: Gram Stain Report Called to,Read Back By and Verified With: OR 12/21/11 1525 BY K SCHULTZ Performed at [facility]   Culture NO GROWTH 3 DAYS   Final    Report Status 12/24/2011 FINAL   Final   ANAEROBIC CULTURE     Status: Normal (Preliminary result)   Collection Time   12/21/11  2:30 PM      Component Value Range Status Comment   Specimen Description ABSCESS HIP RIGHT   Final    Special Requests NONE   Final    Gram Stain     Final    Value: RARE WBC PRESENT, PREDOMINANTLY MONONUCLEAR     NO ORGANISMS SEEN     Gram Stain Report Called to,Read Back By and Verified With: Gram Stain Report Called to,Read Back By and Verified With: OR 12/21/11 1525 BY SCHULTZ Performed at  [facility]   Culture     Final    Value: NO ANAEROBES ISOLATED; CULTURE IN PROGRESS FOR 5 DAYS   Report Status PENDING   Incomplete   GRAM STAIN     Status: Normal   Collection Time   12/21/11  2:30 PM      Component Value Range Status Comment   Specimen Description ABSCESS HIP RIGHT   Final    Special Requests NONE   Final    Gram Stain     Final    Value: RARE WBC PRESENT, PREDOMINANTLY MONONUCLEAR     NO ORGANISMS SEEN     CALLED TO OR 12/21/11 1525 BY K SCHULTZ   Report Status 12/21/2011 FINAL   Final     Coagulation Studies:  Basename 12/21/11 1027  LABPROT 13.9  INR 1.05    Urinalysis:  Basename 12/21/11 1025  COLORURINE AMBER*  LABSPEC 1.022  PHURINE 5.0  GLUCOSEU NEGATIVE  HGBUR MODERATE*  BILIRUBINUR SMALL*  KETONESUR 15*  PROTEINUR >300*  UROBILINOGEN 0.2  NITRITE NEGATIVE  LEUKOCYTESUR LARGE*       Imaging: No results found.   Medications:      . sodium chloride 0.9 % 1,000 mL with potassium chloride 10 mEq infusion 100 mL/hr (12/23/11 1630)      . calcium acetate  667 mg Oral BID  . ciprofloxacin  500 mg Oral q1800  . darbepoetin (ARANESP) injection - DIALYSIS  100 mcg Intravenous Q Fri-HD  . diltiazem  240 mg Oral Daily  . docusate sodium  100 mg Oral BID  . feeding supplement  30 mL Oral BID  . ferric gluconate (FERRLECIT/NULECIT) IV  125 mg Intravenous Q M,W,F-HD  . heparin  5,000 Units Dialysis Once in dialysis  . metoprolol  50 mg Oral BID  . polyethylene glycol  17 g Oral BID  . sertraline  50 mg Oral q morning - 10a  . sotalol  80 mg Oral BID  . vancomycin  1,000 mg Intravenous Q M,W,F-HD   alum & mag hydroxide-simeth, bisacodyl, camphor-menthol, diphenhydrAMINE, HYDROcodone-acetaminophen, HYDROmorphone (DILAUDID) injection, menthol-cetylpyridinium, methocarbamol (ROBAXIN) IV, methocarbamol, metoCLOPramide (REGLAN) injection, metoCLOPramide, ondansetron (ZOFRAN) IV, ondansetron, phenol, zolpidem  Assessment/ Plan:  1. 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 Alvan Dame on 6/7 (s/p removal of infected right hip prothesis 08/29/11), began IV Cefazolin & Vancomycin and PO Cipro.  2. ESRD - HD on MWF at [facility], AVF placed on 3/15 with superficialization on 4/29 (both by Dr. Donnetta Hutching), now using catheter 3. Hypertension/volume - BP 116/66, on Metoprolol 50 mg bid & Diltiazem 240 mg qd, both held yesterday pre-HD; post-HD wt 98.5 kg yesterday (EDW 98.5 kg). 4. Anemia - Hgb down to 8.3 (11.8 on 6/7), on outpatient Epogen and IV Fe, s/p Aranesp 100 mcg yesterday and IV Fe per HD. Recheck pre-HD tomorrow. 5. Metabolic bone disease - Ca 9.6, last P 4.7 on Phoslo 1 with meals. Corrected calcium too high stop phoslo 6. Nutrition - Last Alb 2.2.  7. History of DVT - IVC filter placed on 5/25, not a candidate for anticoagulation, secondary to hemorrhagic CVA in 2009 while  on Coumadin for A-fib.  8. History of atrial fibrillation - on Diltiazem and Sotalol, HR controlled.  9. History of right femur fx - history of pseudomonas sepsis secondary to infected joint hardware s/p removal in 08/29/11.  Plan dialysis today possible home in AM     LOS: 3 Alicha Raspberry W @TODAY @8 :40 AM

## 2011-12-24 NOTE — Progress Notes (Signed)
ANTIBIOTIC CONSULT NOTE - FOLLOW UP  Pharmacy Consult for Vancomycin Indication: Recurrent R Hip Infection  Allergies  Allergen Reactions  . Warfarin Sodium Other (See Comments)    Caused her to have a stroke    Patient Measurements: Height: 5\' 6"  (167.6 cm) Weight: 223 lb 1.7 oz (101.2 kg) IBW/kg (Calculated) : 59.3   Vital Signs: Temp: 97 F (36.1 C) (06/10 0900) Temp src: Oral (06/10 0900) BP: 130/66 mmHg (06/10 1000) Pulse Rate: 104  (06/10 1000) Intake/Output from previous day: 06/09 0701 - 06/10 0700 In: 560 [P.O.:560] Out: 200 [Drains:200] Intake/Output from this shift: Total I/O In: 480 [P.O.:480] Out: -   Labs:  Basename 12/24/11 1005 12/23/11 0635 12/22/11 0545 12/21/11 1027  WBC 9.9 10.4 12.7* --  HGB 8.0* 8.3* 9.4* --  PLT 177 166 224 --  LABCREA -- -- -- --  CREATININE -- 2.30* 3.85* 2.89*   Estimated Creatinine Clearance: 30.1 ml/min (by C-G formula based on Cr of 2.3). No results found for this basename: VANCOTROUGH:2,VANCOPEAK:2,VANCORANDOM:2,GENTTROUGH:2,GENTPEAK:2,GENTRANDOM:2,TOBRATROUGH:2,TOBRAPEAK:2,TOBRARND:2,AMIKACINPEAK:2,AMIKACINTROU:2,AMIKACIN:2, in the last 72 hours   Microbiology: Recent Results (from the past 720 hour(s))  SURGICAL PCR SCREEN     Status: Abnormal   Collection Time   12/08/11  7:24 AM      Component Value Range Status Comment   MRSA, PCR POSITIVE (*) NEGATIVE  Final    Staphylococcus aureus POSITIVE (*) NEGATIVE  Final   CULTURE, ROUTINE-ABSCESS     Status: Normal   Collection Time   12/21/11  2:30 PM      Component Value Range Status Comment   Specimen Description ABSCESS HIP RIGHT   Final    Special Requests NONE   Final    Gram Stain     Final    Value: RARE WBC PRESENT, PREDOMINANTLY MONONUCLEAR     NO ORGANISMS SEEN     Gram Stain Report Called to,Read Back By and Verified With: Gram Stain Report Called to,Read Back By and Verified With: OR 12/21/11 1525 BY K SCHULTZ Performed at [facility]   Culture NO  GROWTH 3 DAYS   Final    Report Status 12/24/2011 FINAL   Final   ANAEROBIC CULTURE     Status: Normal (Preliminary result)   Collection Time   12/21/11  2:30 PM      Component Value Range Status Comment   Specimen Description ABSCESS HIP RIGHT   Final    Special Requests NONE   Final    Gram Stain     Final    Value: RARE WBC PRESENT, PREDOMINANTLY MONONUCLEAR     NO ORGANISMS SEEN     Gram Stain Report Called to,Read Back By and Verified With: Gram Stain Report Called to,Read Back By and Verified With: OR 12/21/11 1525 BY SCHULTZ Performed at [facility]   Culture     Final    Value: NO ANAEROBES ISOLATED; CULTURE IN PROGRESS FOR 5 DAYS   Report Status PENDING   Incomplete   GRAM STAIN     Status: Normal   Collection Time   12/21/11  2:30 PM      Component Value Range Status Comment   Specimen Description ABSCESS HIP RIGHT   Final    Special Requests NONE   Final    Gram Stain     Final    Value: RARE WBC PRESENT, PREDOMINANTLY MONONUCLEAR     NO ORGANISMS SEEN     CALLED TO OR 12/21/11 1525 BY K SCHULTZ  Report Status 12/21/2011 FINAL   Final     Anti-infectives     Start     Dose/Rate Route Frequency Ordered Stop   12/26/11 1200   vancomycin (VANCOCIN) IVPB 1000 mg/200 mL premix        1,000 mg 200 mL/hr over 60 Minutes Intravenous Every M-W-F (Hemodialysis) 12/24/11 0949     12/24/11 1200   vancomycin (VANCOCIN) IVPB 1000 mg/200 mL premix  Status:  Discontinued        1,000 mg 200 mL/hr over 60 Minutes Intravenous Every M-W-F (Hemodialysis) 12/21/11 1852 12/24/11 0949   12/24/11 1200   vancomycin (VANCOCIN) 1,250 mg in sodium chloride 0.9 % 250 mL IVPB        1,250 mg 166.7 mL/hr over 90 Minutes Intravenous Every Mon (Hemodialysis) 12/24/11 0949 12/31/11 1159   12/22/11 1800   ciprofloxacin (CIPRO) tablet 500 mg        500 mg Oral Daily-1800 12/21/11 1805     12/21/11 2000   vancomycin (VANCOCIN) 2,000 mg in sodium chloride 0.9 % 500 mL IVPB        2,000 mg 250  mL/hr over 120 Minutes Intravenous  Once 12/21/11 1851 12/22/11 0013   12/21/11 1452   gentamycin 80 mg in 0.9% normal saline 250 mL irrigation  Status:  Discontinued          As needed 12/21/11 1453 12/21/11 1611   12/21/11 1451   vancomycin (VANCOCIN) powder  Status:  Discontinued          As needed 12/21/11 1451 12/21/11 1611   12/21/11 1415   vancomycin (VANCOCIN) powder 2,000 mg  Status:  Discontinued        2,000 mg Other To Surgery 12/21/11 1406 12/21/11 1805   12/21/11 1025   ceFAZolin (ANCEF) 2-3 GM-% IVPB SOLR     Comments: Darlin Drop: cabinet override         12/21/11 1025 12/21/11 2229   12/21/11 1019   ceFAZolin (ANCEF) IVPB 2 g/50 mL premix        2 g 100 mL/hr over 30 Minutes Intravenous 60 min pre-op 12/21/11 1019 12/21/11 1345          Assessment: 65 y.o. F with ESRD on Vancomycin Rx + Cipro MD (resumed from PTA) abx D#4 this admission for recurrent R hip infection, s/p I&D on 6/7. Abscess cultures have shown no growth. Afebrile, WBC WNL. The patient normally receives HD on MWF however received an extra HD session over the weekend on Sat, 6/8. The patient was loaded with Vancomycin on 6/7 and did not receive a MD after HD on 6/8. Will give a higher dose post-HD today to make up for the missed dose on 6/8. The patient is currently receiving HD this a.m and tolerating well.   Goal of Therapy:  Pre-HD Vancomycin level of 15-25 mcg/ml  Plan:  1. Vancomcyin 1250 mg IV post HD session today 2. Vancomycin 1g IV post HD sessions on MWF (starting on 6/12) 3. Will continue to follow HD schedule/duration, culture results, LOT, and antibiotic de-escalation plans   Alycia Rossetti, PharmD, BCPS Clinical Pharmacist Pager: [phone] 12/24/2011 10:27 AM

## 2011-12-24 NOTE — Progress Notes (Signed)
Patient ID: [name], female   DOB: 06/12/1946, 66 y.o.   MRN: [id number] Subjective: 3 Days Post-Op Procedure(s) (LRB): IRRIGATION AND DEBRIDEMENT HIP (Right)    Patient reports pain as mild.  Objective:   VITALS:   Filed Vitals:   12/24/11 1030  BP: 119/70  Pulse: 104  Temp:   Resp: 18    Incision: dressing C/D/I.  I removed the drain today and there was some minor serous drainage after this, this wound was redressed otherwise main incision helaed  LABS  Basename 12/24/11 1005 12/23/11 0635 12/22/11 0545  HGB 8.0* 8.3* 9.4*  HCT 24.8* 25.6* 29.5*  WBC 9.9 10.4 12.7*  PLT 177 166 224     Basename 12/23/11 0635 12/22/11 0545  NA 140 138  K 3.8 3.0*  BUN 14 35*  CREATININE 2.30* 3.85*  GLUCOSE 88 95    No results found for this basename: LABPT:2,INR:2 in the last 72 hours   Assessment/Plan: 3 Days Post-Op Procedure(s) (LRB): IRRIGATION AND DEBRIDEMENT HIP (Right)   Advance diet Plan for D/C to [facility] per assistance from social work Called from pharmacy regarding sotolol dose associated with renal failure and a-fib  Medication dose changed per pharmacy recs to 80mg  Q48hr TDWB permitted on right, otherwise WBAT RLE and BUE CT scan ordered of right proximal hip in order to better define anatomy for pre-operative planning

## 2011-12-24 NOTE — Progress Notes (Signed)
PT Cancel Note:   Pt in dialysis at this time.  Will attempt back later today if time allows.    Sarajane Marek, [place] [phone] 12/24/2011

## 2011-12-24 NOTE — Clinical Social Work Psychosocial (Deleted)
    Clinical Social Work Department BRIEF PSYCHOSOCIAL ASSESSMENT 12/24/2011  Patient:  [name], [name]     Account Number:  [id number]     Admit date:  12/21/2011  Clinical Social Worker:  Robbi Garter  Date/Time:  12/23/2011 02:30 PM  Referred by:  Physician  Date Referred:  12/22/2011 Referred for  SNF Placement   Other Referral:   Interview type:  Patient Other interview type:    PSYCHOSOCIAL DATA Living Status:  FACILITY Admitted from facility:  [facility] Level of care:  [place] Primary support name:  [name] Primary support relationship to patient:  SPOUSE Degree of support available:   Adequate, per pt    CURRENT CONCERNS Current Concerns  Post-Acute Placement   Other Concerns:    SOCIAL WORK ASSESSMENT / PLAN CSW met with pt re: PT recommendation for SNF. Pt admittied from [facility] and plan is for her to return at d/c. FL2 on chart for MD signature. Pt reports she believes she is near the end of allowed SNF days, per her insurance, and that [facility] is Proofreader. Weekday CSW to f/u with [facility] admissions coordinator.   Assessment/plan status:  Information/Referral to [employer] Other assessment/ plan:   Information/referral to community resources:   SNF    PATIENT'S/FAMILY'S RESPONSE TO PLAN OF CARE: Pt reports agreeable to return to SNF. Pt verbalized appreciation for CSW visit and assist.

## 2011-12-25 MED ORDER — MUPIROCIN 2 % EX OINT
1.0000 "application " | TOPICAL_OINTMENT | Freq: Two times a day (BID) | CUTANEOUS | Status: DC
  Filled 2011-12-25: qty 22

## 2011-12-25 MED ORDER — METHOCARBAMOL 500 MG PO TABS: 500.0000 mg | ORAL_TABLET | Freq: Four times a day (QID) | ORAL | Status: AC | PRN

## 2011-12-25 MED ORDER — HYDROCODONE-ACETAMINOPHEN 7.5-325 MG PO TABS: 1.0000 | ORAL_TABLET | ORAL | Status: AC | PRN

## 2011-12-25 MED ORDER — BISACODYL 5 MG PO TBEC: 5.0000 mg | DELAYED_RELEASE_TABLET | Freq: Every day | ORAL | Status: AC | PRN

## 2011-12-25 MED ORDER — CHLORHEXIDINE GLUCONATE CLOTH 2 % EX PADS: 6.0000 | MEDICATED_PAD | Freq: Every day | CUTANEOUS | Status: DC

## 2011-12-25 MED ORDER — DSS 100 MG PO CAPS: 100.0000 mg | ORAL_CAPSULE | Freq: Two times a day (BID) | ORAL | Status: AC | PRN

## 2011-12-25 MED ORDER — VANCOMYCIN HCL IN DEXTROSE 1-5 GM/200ML-% IV SOLN: 1000.0000 mg | INTRAVENOUS | Status: DC

## 2011-12-25 MED ORDER — SODIUM CHLORIDE 0.9 % IV SOLN: 125.0000 mg | INTRAVENOUS | Status: DC

## 2011-12-25 MED ORDER — SOTALOL HCL 80 MG PO TABS: 80.0000 mg | ORAL_TABLET | ORAL | Status: DC

## 2011-12-25 NOTE — Progress Notes (Signed)
Per MD- patient is ok for d/c today back to [facility]. Spoke to [facility]- patient's private room is available.  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  [place] for return to facility per [facility].  EMS to tranport.  Notified pt's nurse- Izell Gasport.  Patient's husband in room and is aware of d/c.  Discussed with patient her per request information about needing a Medicaid application as she only has a few remaining days left at [facility] with her [facility].  Her husband stated that he had applied in the past for home medicaid and was denied. He was encouraged to reapply for long term Medicaid. Process discussed.  Patient and husband verbalized understanding. Notified Roxanne- SW at [facility]- she will also follow up with patient/husband regarding Medicaid process and assist as needed.  No further CSW  intervention indicated.     Lorie Phenix. [place], [place]

## 2011-12-25 NOTE — Progress Notes (Signed)
[facility] ROUNDING NOTE   Subjective:   Interval History: none.  Objective:  Vital signs in last 24 hours:  Temp:  [97 F (36.1 C)-98.9 F (37.2 C)] 98.9 F (37.2 C) (06/11 0800) Pulse Rate:  [90-107] 92  (06/11 0800) Resp:  [15-20] 20  (06/11 0800) BP: (95-135)/(54-77) 95/60 mmHg (06/11 0800) SpO2:  [94 %-98 %] 98 % (06/11 0800) Weight:  [99.2 kg (218 lb 11.1 oz)-101.2 kg (223 lb 1.7 oz)] 99.2 kg (218 lb 11.1 oz) (06/10 1341)  Weight change:  Filed Weights   12/22/11 1815 12/24/11 0900 12/24/11 1341  Weight: 98.5 kg (217 lb 2.5 oz) 101.2 kg (223 lb 1.7 oz) 99.2 kg (218 lb 11.1 oz)    Intake/Output: I/O last 3 completed shifts: In: 1000 [P.O.:840; I.V.:160] Out: 2051 [Drains:50; Other:2000; Stool:1]   Intake/Output this shift:  Total I/O In: 22 [P.O.:60] Out: -   CVS- RRR RS- CTA ABD- BS present soft non-distended EXT- no edema   Basic Metabolic Panel:  Lab [id number] 1005 12/23/11 0635 12/22/11 0545 12/21/11 1027  NA 137 140 138 139  K 3.6 3.8 3.0* 3.2*  CL 101 105 99 98  CO2 28 29 24 27   GLUCOSE 98 88 95 85  BUN 26* 14 35* 25*  CREATININE 3.54* 2.30* 3.85* 2.89*  CALCIUM 13.7* 9.8 9.6 --  MG -- -- -- --  PHOS 2.9 -- -- --    Liver Function Tests:  Lab 12/24/11 1005  AST --  ALT --  ALKPHOS --  BILITOT --  PROT --  ALBUMIN 2.0*   No results found for this basename: LIPASE:5,AMYLASE:5 in the last 168 hours No results found for this basename: AMMONIA:3 in the last 168 hours  CBC:  Lab 12/24/11 1005 12/23/11 0635 12/22/11 0545 12/21/11 1027  WBC 9.9 10.4 12.7* 10.0  NEUTROABS -- -- -- --  HGB 8.0* 8.3* 9.4* 11.8*  HCT 24.8* 25.6* 29.5* 37.4  MCV 80.0 81.0 81.9 81.8  PLT 177 166 224 246    Cardiac Enzymes: No results found for this basename: CKTOTAL:5,CKMB:5,CKMBINDEX:5,TROPONINI:5 in the last 168 hours  BNP: No components found with this basename: POCBNP:5  CBG: No results found for this basename: GLUCAP:5 in the last  168 hours  Microbiology: Results for orders placed during the hospital encounter of 12/21/11  CULTURE, ROUTINE-ABSCESS     Status: Normal   Collection Time   12/21/11  2:30 PM      Component Value Range Status Comment   Specimen Description ABSCESS HIP RIGHT   Final    Special Requests NONE   Final    Gram Stain     Final    Value: RARE WBC PRESENT, PREDOMINANTLY MONONUCLEAR     NO ORGANISMS SEEN     Gram Stain Report Called to,Read Back By and Verified With: Gram Stain Report Called to,Read Back By and Verified With: OR 12/21/11 1525 BY K SCHULTZ Performed at [facility]   Culture NO GROWTH 3 DAYS   Final    Report Status 12/24/2011 FINAL   Final   ANAEROBIC CULTURE     Status: Normal (Preliminary result)   Collection Time   12/21/11  2:30 PM      Component Value Range Status Comment   Specimen Description ABSCESS HIP RIGHT   Final    Special Requests NONE   Final    Gram Stain     Final    Value: RARE WBC PRESENT, PREDOMINANTLY MONONUCLEAR  NO ORGANISMS SEEN     Gram Stain Report Called to,Read Back By and Verified With: Gram Stain Report Called to,Read Back By and Verified With: OR 12/21/11 1525 BY SCHULTZ Performed at [facility]   Culture     Final    Value: NO ANAEROBES ISOLATED; CULTURE IN PROGRESS FOR 5 DAYS   Report Status PENDING   Incomplete   GRAM STAIN     Status: Normal   Collection Time   12/21/11  2:30 PM      Component Value Range Status Comment   Specimen Description ABSCESS HIP RIGHT   Final    Special Requests NONE   Final    Gram Stain     Final    Value: RARE WBC PRESENT, PREDOMINANTLY MONONUCLEAR     NO ORGANISMS SEEN     CALLED TO OR 12/21/11 1525 BY K SCHULTZ   Report Status 12/21/2011 FINAL   Final     Coagulation Studies: No results found for this basename: LABPROT:5,INR:5 in the last 72 hours  Urinalysis: No results found for this basename:  COLORURINE:2,APPERANCEUR:2,LABSPEC:2,PHURINE:2,GLUCOSEU:2,HGBUR:2,BILIRUBINUR:2,KETONESUR:2,PROTEINUR:2,UROBILINOGEN:2,NITRITE:2,LEUKOCYTESUR:2 in the last 72 hours    Imaging: Ct Hip Right Wo Contrast  12/24/2011  *RADIOLOGY REPORT*  Clinical Data:  History of right hip replacement with subsequent periprosthetic fracture which required open fixation.  The patient then developed a nonhealing wound and infected nonunion of the right hip. Hardware has been removed.  CT RIGHT HIP WITHOUT CONTRAST  Technique:  Multidetector CT imaging of the right hip was performed according to the standard protocol without intravenous contrast. Multiplanar CT image reconstructions were also generated.  Comparison:  Plain films 06/07/2011 and 09/21/2011.  Findings:  Antibiotic impregnated spacer seen on the most recent plain films has been removed.  Multiple antibiotic beads are in place.  Resection of the femoral head and  proximal femoral diaphysis is seen.  Osteotomy defect of the femur is located 11 cm below the top of the greater trochanter which remains intact. There is some heterotopic ossification present anteriorly in the right thigh but there is no bridging bone between the greater trochanter remnant and the femoral diaphysis.  Periosteal new bone formation about the diaphysis of the right femur is identified which could be due to chronic osteomyelitis and/or postoperative change.  Soft tissue structures demonstrate some gas present laterally and along the proximal diaphysis consistent with recent surgery on 12/21/2011.  No focal fluid collection to suggest abscess is identified.  IMPRESSION: Postoperative change of the right hip and femur as described. Heterotopic ossification along the anterior aspect of the right thigh is again noted but there is no bridging bone between the right greater trochanter diaphysis of the femur.  Antibiotic spacer has been removed with multiple antibiotic impregnated beads in place.   *RADIOLOGY REPORT*  Clinical Data:  History of right hip replacement with subsequent periprosthetic fracture which requiring open fixation.  The patient then developed a nonhealing wound and infected nonunion of the right hip. Hardware has been removed.  3-DIMENSIONAL CT IMAGE RENDERING AT INDEPENDENT WORKSTATION:  Technique:  3-dimensional CT images were rendered by post- processing of the original CT data at independent workstation.  The 3-dimensional CT images were interpreted, and findings were reported in the accompanying complete CT report for this study.  Comparison:   None.  Findings:  Surface rendered 3-D imaging confirms the above findings.  IMPRESSION: As above.  Original Report Authenticated By: Arvid Right. D'ALESSIO, M.D.   Ct 3d Independent Darreld Mclean  12/24/2011  *  RADIOLOGY REPORT*  Clinical Data:  History of right hip replacement with subsequent periprosthetic fracture which required open fixation.  The patient then developed a nonhealing wound and infected nonunion of the right hip. Hardware has been removed.  CT RIGHT HIP WITHOUT CONTRAST  Technique:  Multidetector CT imaging of the right hip was performed according to the standard protocol without intravenous contrast. Multiplanar CT image reconstructions were also generated.  Comparison:  Plain films 06/07/2011 and 09/21/2011.  Findings:  Antibiotic impregnated spacer seen on the most recent plain films has been removed.  Multiple antibiotic beads are in place.  Resection of the femoral head and  proximal femoral diaphysis is seen.  Osteotomy defect of the femur is located 11 cm below the top of the greater trochanter which remains intact. There is some heterotopic ossification present anteriorly in the right thigh but there is no bridging bone between the greater trochanter remnant and the femoral diaphysis.  Periosteal new bone formation about the diaphysis of the right femur is identified which could be due to chronic osteomyelitis and/or  postoperative change.  Soft tissue structures demonstrate some gas present laterally and along the proximal diaphysis consistent with recent surgery on 12/21/2011.  No focal fluid collection to suggest abscess is identified.  IMPRESSION: Postoperative change of the right hip and femur as described. Heterotopic ossification along the anterior aspect of the right thigh is again noted but there is no bridging bone between the right greater trochanter diaphysis of the femur.  Antibiotic spacer has been removed with multiple antibiotic impregnated beads in place.  *RADIOLOGY REPORT*  Clinical Data:  History of right hip replacement with subsequent periprosthetic fracture which requiring open fixation.  The patient then developed a nonhealing wound and infected nonunion of the right hip. Hardware has been removed.  3-DIMENSIONAL CT IMAGE RENDERING AT INDEPENDENT WORKSTATION:  Technique:  3-dimensional CT images were rendered by post- processing of the original CT data at independent workstation.  The 3-dimensional CT images were interpreted, and findings were reported in the accompanying complete CT report for this study.  Comparison:   None.  Findings:  Surface rendered 3-D imaging confirms the above findings.  IMPRESSION: As above.  Original Report Authenticated By: Arvid Right. Luther Parody, M.D.     Medications:      . sodium chloride 0.9 % 1,000 mL with potassium chloride 10 mEq infusion 100 mL/hr (12/23/11 1630)      . ciprofloxacin  500 mg Oral q1800  . darbepoetin (ARANESP) injection - DIALYSIS  100 mcg Intravenous Q Mon-HD  . diltiazem  240 mg Oral Daily  . docusate sodium  100 mg Oral BID  . feeding supplement  30 mL Oral BID  . ferric gluconate (FERRLECIT/NULECIT) IV  125 mg Intravenous Q M,W,F-HD  . heparin  5,000 Units Dialysis Once in dialysis  . metoprolol  50 mg Oral BID  . polyethylene glycol  17 g Oral BID  . sertraline  50 mg Oral q morning - 10a  . sotalol  80 mg Oral Q48H  .  vancomycin  1,250 mg Intravenous Q Mon-HD  . vancomycin  1,000 mg Intravenous Q M,W,F-HD  . DISCONTD: darbepoetin (ARANESP) injection - DIALYSIS  100 mcg Intravenous Q Fri-HD  . DISCONTD: sotalol  80 mg Oral BID  . DISCONTD: vancomycin  1,000 mg Intravenous Q M,W,F-HD   alum & mag hydroxide-simeth, bisacodyl, camphor-menthol, diphenhydrAMINE, HYDROcodone-acetaminophen, HYDROmorphone (DILAUDID) injection, menthol-cetylpyridinium, methocarbamol (ROBAXIN) IV, methocarbamol, metoCLOPramide (REGLAN) injection, metoCLOPramide, ondansetron (ZOFRAN) IV, ondansetron, phenol, zolpidem  Assessment/ Plan:  1. 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 Alvan Dame on 6/7 (s/p removal of infected right hip prothesis 08/29/11), began IV Cefazolin & Vancomycin and PO Cipro.  2. ESRD - HD on MWF at [facility], AVF placed on 3/15 with superficialization on 4/29 (both by Dr. Donnetta Hutching), now using catheter 3. Hypertension/volume - controlled 4. Anemia - Hgb down to 8.3 (11.8 on 6/7), on outpatient Epogen and IV Fe,  5. Metabolic bone disease - Ca 9.6, last P 4.7 on Phoslo 1 with meals. Corrected calcium too high stop phoslo 6. Nutrition - Last Alb 2.2.  7. 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  8. History of atrial fibrillation - on Diltiazem and Sotalol, HR controlled.  9. History of right femur fx - history of pseudomonas sepsis secondary to infected joint hardware s/p removal in 08/29/11.   Nursing home      LOS: 4 Kytzia Gienger W @TODAY @8 :57 AM

## 2011-12-25 NOTE — Discharge Summary (Signed)
Physician Discharge Summary  Patient ID: [name] MRN: [id number] DOB/AGE: 1946/01/11 66 y.o.  Admit date: 12/21/2011 Discharge date: 12/25/2011  Procedures:  Procedure(s) (LRB): IRRIGATION AND DEBRIDEMENT HIP (Right)  Attending Physician:  Dr. Paralee Cancel   Admission Diagnoses: Recurrent right hip pain with fluid collection, possibly infection  Discharge Diagnoses:  Principal Problem:  *Abscess of hip, right Hx of Stroke   Gout   Anemia   Arthritis   Cardiomyopathy   Hypercalcemia   CHF (congestive heart failure)   Hypertension   Atrial fibrillation   Arrhythmia   H/O blood clots   Asthma   Eczema   Constipation   Oligouria   Kidney disease - dialysis on M/W/F   Depression   Sleep apnea    HPI: This is a 67 year old lady status post total hip arthroplasty with subsequent fall and periprosthetic fracture, with subsequent wound breakdown and infection. She has been utilizing a wound VAC for wound closure, but still has elevated sed rate and CRP. Subsequent surgery removed the prosthesis, washed out the area and implantation of a antibiotic spacer. On a subsequent office vist the patient started to have right hip pain, an MRI was obtained which showed a collection of fluid. With the patient's history and the MRI findings there is a potential of infection in the right hip. Plan is to I&D the right hip, cleaning out the incision and removal or and reimplantation of a antibiotic spacer. 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 The surgery, risks, benefits, and aftercare were discussed in detail with the patient and her husband; they understand and wish to proceed. Questions invited and answered.  PCP: Garnet Koyanagi, DO, DO   Discharged Condition: good  Hospital Course:  Patient underwent the above stated procedure on 12/21/2011. Patient tolerated the procedure well and brought to the recovery room in good  condition and subsequently to the floor.  POD #1 BP: 92/48 ; Pulse: 89 ; Temp: 98.7 F (37.1 C) ; Resp: 18 Afebrile. No culture report as yet.  LABS  Basename  12/22/2011  0545  HGB  9.4  HCT  29.5   POD #2  BP: 128/70 ; Pulse: 88 ; Temp: 98.3 F (36.6 C) ; Resp: 16 Drain in Place and will have Dialysis in the A.M. Cultures thus far show no growth. Neurovascular intact and dorsiflexion/plantar flexion intact  LABS  Basename  12/23/2011  0635  HGB  8.3  HCT  25.6   POD #3  BP: 115/66 ; Pulse: 90 ; Temp: 97.9 F (37.1 C) ; Resp: 20 Patient reports pain as mild. Incision: dressing C/D/I. Pt's hemovac drain removed, there was some minor serous drainage after this, this wound was redressed otherwise main incision helaed  LABS  Basename  12/24/2011  1005  HGB  8.0  HCT  24.8   POD #4 BP: 115/66 ; Pulse: 90 ; Temp: 98.8 F (37.1 C) ; Resp: 20  Patient reports pain as mild, pain well controlled. She is feeling well. No events throughout the night. Ready to be discharged to SNF. Incision: dressing C/D/I, no cellulitis present and compartment soft  LABS   No new labs   Discharge Exam: General appearance: alert, cooperative and no distress Extremities: Homans sign is negative, no sign of DVT, no edema, redness or tenderness in the calves or thighs and no ulcers, gangrene or trophic changes  Disposition:  [place]  with follow up in 2 weeks   Discharge Orders    Future Appointments: Provider: Department: Dept Phone: Center:   12/27/2011 10:45 AM Carlyle Basques, MD [facility] [phone] [facility]     Future Orders Please Complete By Expires   Diet - low sodium heart healthy      Call MD / Call 911      Comments:   If you experience chest pain or shortness of breath, CALL 911 and be transported to the hospital emergency room.  If you develope a fever above 101 F, pus (white drainage) or increased drainage or redness at the wound, or calf pain, call your  surgeon's office.   Discharge instructions      Comments:   Daily dressing changes with 4x4 gauze and tape. Keep the area dry and clean until follow up. Follow up in 2 weeks at [facility] Call with any questions or concerns.     Constipation Prevention      Comments:   Drink plenty of fluids.  Prune juice may be helpful.  You may use a stool softener, such as Colace (over the counter) 100 mg twice a day.  Use MiraLax (over the counter) for constipation as needed.   Increase activity slowly as tolerated      Driving restrictions      Comments:   No driving for 4 weeks   Change dressing      Comments:   Daily dressing changes with 4x4 guaze and tape. Keep the area dry and clean.   TED hose      Comments:   Use stockings (TED hose) for 2 weeks on both leg(s).  You may remove them at night for sleeping.   Non weight bearing      Comments:   Right leg       Current Discharge Medication List    START taking these medications   Details  bisacodyl (DULCOLAX) 5 MG EC tablet Take 1 tablet (5 mg total) by mouth daily as needed.    docusate sodium 100 MG CAPS Take 100 mg by mouth 2 (two) times daily as needed for constipation.    methocarbamol (ROBAXIN) 500 MG tablet Take 1 tablet (500 mg total) by mouth every 6 (six) hours as needed (muscle spasms). Qty: 50 tablet, Refills: 0    sodium chloride 0.9 % SOLN 100 mL with ferric gluconate 12.5 MG/ML SOLN 125 mg Inject 125 mg into the vein every Monday, Wednesday, and Friday with hemodialysis.    vancomycin (VANCOCIN) 1 GM/200ML SOLN Inject 200 mLs (1,000 mg total) into the vein every Monday, Wednesday, and Friday with hemodialysis. Qty: 4000 mL      CONTINUE these medications which have CHANGED   Details  HYDROcodone-acetaminophen (NORCO) 7.5-325 MG per tablet Take 1-2 tablets by mouth every 4 (four) hours as needed for pain. Qty: 120 tablet, Refills: 0    sotalol (BETAPACE) 80 MG tablet Take 1 tablet (80 mg total) by  mouth every other day.      CONTINUE these medications which have NOT CHANGED   Details  calcium acetate (PHOSLO) 667 MG capsule Take 667 mg by mouth 2 (two) times daily before a meal.     ciprofloxacin (CIPRO) 500 MG tablet Take 500 mg by mouth daily at 6 PM.    feeding supplement (PRO-STAT SUGAR FREE 64) LIQD Take 30 mLs by mouth 2 (two) times daily.    Multiple Vitamins-Minerals (MULTIVITAMINS THER. W/MINERALS) TABS Take 1 tablet by  mouth 2 (two) times daily.     sertraline (ZOLOFT) 50 MG tablet Take 50 mg by mouth every morning. Take before breakfast    diltiazem (CARDIZEM CD) 240 MG 24 hr capsule Take 240 mg by mouth daily.    diphenhydrAMINE (BENADRYL) 25 MG tablet Take 25 mg by mouth every 6 (six) hours as needed. Itching/rash    metoprolol (LOPRESSOR) 50 MG tablet Take 50 mg by mouth 2 (two) times daily. Hold on mornings of dialysis (M,W,F)    Nutritional Supplements (NEPRO PO) Take 1 Can by mouth daily.    polyethylene glycol (MIRALAX / GLYCOLAX) packet Take 17 g by mouth 2 (two) times daily.         Follow-up Information    Follow up with [name], MD. Schedule an appointment as soon as possible for a visit in 2 weeks.   Contact information:   [facility] [address], [address] [place] [id number]          Signed: [place]. Noriel Guthrie   PAC  12/25/2011, 7:29 AM

## 2011-12-25 NOTE — Progress Notes (Signed)
Subjective: 4 Days Post-Op Procedure(s) (LRB): IRRIGATION AND DEBRIDEMENT HIP (Right)   Patient reports pain as mild, pain well controlled. She is feeling well. No events throughout the night. Ready to be discharged to SNF.  Objective:   VITALS:   Filed Vitals:   12/25/11 0527  BP: 115/66  Pulse: 90  Temp: 98.8 F (37.1 C)  Resp: 20    Incision: dressing C/D/I No cellulitis present Compartment soft  LABS  Basename 12/24/11 1005 12/23/11 0635  HGB 8.0* 8.3*  HCT 24.8* 25.6*  WBC 9.9 10.4  PLT 177 166     Basename 12/24/11 1005 12/23/11 0635  NA 137 140  K 3.6 3.8  BUN 26* 14  CREATININE 3.54* 2.30*  GLUCOSE 98 88     Assessment/Plan: 4 Days Post-Op Procedure(s) (LRB): IRRIGATION AND DEBRIDEMENT HIP (Right)   Up with therapy Discharge to SNF Follow up in 2 weeks at [facility].  Follow-up Information    Follow up with OLIN,Delta Pichon D in 2 weeks.   Contact information:   [facility] [address], [address] [place] Micayla Brathwaite   PAC  12/25/2011, 7:34 AM

## 2011-12-25 NOTE — Progress Notes (Signed)
Utilization review completed. Rozanna Boer, RN, BSN. 12/25/11

## 2011-12-25 NOTE — Progress Notes (Signed)
Physical Therapy Treatment Patient Details Name: [name] MRN: [id number] DOB: [birth year] Today's Date: 12/25/2011 Time: [id number] PT Time Calculation (min): 14 min  PT Assessment / Plan / Recommendation Comments on Treatment Session  Limited session due to pt awaiting IV nurse to disconnect prior to d/c to SNF.  RN reports IV nurse has been called & should be on their way.      Follow Up Recommendations  Skilled nursing facility    Barriers to Discharge        Equipment Recommendations  None recommended by PT    Recommendations for Other Services    Frequency Min 3X/week   Plan Discharge plan remains appropriate    Precautions / Restrictions Restrictions Weight Bearing Restrictions: Yes RLE Weight Bearing: Non weight bearing       Mobility  Bed Mobility Bed Mobility: Rolling Left;Rolling Right;Supine to Sit Rolling Right: 6: Modified independent (Device/Increase time) Rolling Left: With rail;4: Min assist Supine to Sit: 6: Modified independent (Device/Increase time);With rails Details for Bed Mobility Assistance: rolling L<>R 3x's each side.  Uses rail to assist rolling.  Pt able to transition to longsitting without physical (A).   Transfers Transfers: Not assessed Ambulation/Gait Ambulation/Gait Assistance: Not tested (comment) Wheelchair Mobility Wheelchair Mobility: No          PT Goals Acute Rehab PT Goals Time For Goal Achievement: 12/30/11 Potential to Achieve Goals: Good  Visit Information  Last PT Received On: 12/25/11 Assistance Needed: +1    Subjective Data      Cognition  Overall Cognitive Status: Appears within functional limits for tasks assessed/performed Arousal/Alertness: Awake/alert Orientation Level: Appears intact for tasks assessed Behavior During Session: [facility] for tasks performed    Balance  Balance Balance Assessed: No  End of Session PT - End of Session Activity Tolerance: Patient tolerated treatment well Patient left: in  bed;with call bell/phone within reach;with nursing in room;with family/visitor present    Sena Hitch 12/25/2011, 10:33 AM   Sarajane Marek, PTA [phone] 12/25/2011

## 2011-12-26 LAB — ANAEROBIC CULTURE

## 2011-12-27 ENCOUNTER — Encounter: Payer: Self-pay | Admitting: Internal Medicine

## 2011-12-27 ENCOUNTER — Ambulatory Visit (INDEPENDENT_AMBULATORY_CARE_PROVIDER_SITE_OTHER): Payer: Medicare Other | Admitting: Internal Medicine

## 2011-12-27 VITALS — BP 112/74 | HR 80 | Temp 97.8°F

## 2011-12-27 DIAGNOSIS — Z96649 Presence of unspecified artificial hip joint: Secondary | ICD-10-CM

## 2011-12-27 DIAGNOSIS — T8450XA Infection and inflammatory reaction due to unspecified internal joint prosthesis, initial encounter: Secondary | ICD-10-CM

## 2011-12-27 DIAGNOSIS — T8459XA Infection and inflammatory reaction due to other internal joint prosthesis, initial encounter: Secondary | ICD-10-CM

## 2012-01-22 ENCOUNTER — Ambulatory Visit (INDEPENDENT_AMBULATORY_CARE_PROVIDER_SITE_OTHER): Payer: Medicare Other | Admitting: Internal Medicine

## 2012-01-22 ENCOUNTER — Encounter: Payer: Self-pay | Admitting: Internal Medicine

## 2012-01-22 VITALS — BP 119/76 | HR 70 | Temp 97.7°F

## 2012-01-22 DIAGNOSIS — T8450XA Infection and inflammatory reaction due to unspecified internal joint prosthesis, initial encounter: Secondary | ICD-10-CM

## 2012-01-22 LAB — CBC WITH DIFFERENTIAL/PLATELET
Basophils Absolute: 0.1 10*3/uL (ref 0.0–0.1)
Basophils Relative: 1 % (ref 0–1)
Eosinophils Absolute: 0.1 10*3/uL (ref 0.0–0.7)
Eosinophils Relative: 1 % (ref 0–5)
HCT: 39.6 % (ref 36.0–46.0)
Hemoglobin: 12 g/dL (ref 12.0–15.0)
Lymphocytes Relative: 32 % (ref 12–46)
Lymphs Abs: 2.4 10*3/uL (ref 0.7–4.0)
MCH: 25.9 pg — ABNORMAL LOW (ref 26.0–34.0)
MCHC: 30.3 g/dL (ref 30.0–36.0)
MCV: 85.3 fL (ref 78.0–100.0)
Monocytes Absolute: 1.4 10*3/uL — ABNORMAL HIGH (ref 0.1–1.0)
Monocytes Relative: 18 % — ABNORMAL HIGH (ref 3–12)
Neutro Abs: 3.6 10*3/uL (ref 1.7–7.7)
Neutrophils Relative %: 48 % (ref 43–77)
Platelets: 201 10*3/uL (ref 150–400)
RBC: 4.64 MIL/uL (ref 3.87–5.11)
RDW: 18.6 % — ABNORMAL HIGH (ref 11.5–15.5)
WBC: 7.6 10*3/uL (ref 4.0–10.5)

## 2012-01-22 LAB — SEDIMENTATION RATE: Sed Rate: 4 mm/hr (ref 0–22)

## 2012-01-23 LAB — C-REACTIVE PROTEIN: CRP: 1.96 mg/dL — ABNORMAL HIGH (ref <0.60)

## 2012-01-28 ENCOUNTER — Ambulatory Visit: Payer: Medicare Other | Admitting: Family Medicine

## 2012-02-12 ENCOUNTER — Encounter: Payer: Self-pay | Admitting: Family Medicine

## 2012-02-12 ENCOUNTER — Ambulatory Visit (INDEPENDENT_AMBULATORY_CARE_PROVIDER_SITE_OTHER): Payer: Medicare Other | Admitting: Family Medicine

## 2012-02-12 VITALS — BP 134/78 | HR 70 | Temp 98.7°F

## 2012-02-12 DIAGNOSIS — A498 Other bacterial infections of unspecified site: Secondary | ICD-10-CM

## 2012-02-12 DIAGNOSIS — N186 End stage renal disease: Secondary | ICD-10-CM

## 2012-02-12 DIAGNOSIS — K5900 Constipation, unspecified: Secondary | ICD-10-CM

## 2012-02-12 DIAGNOSIS — K5903 Drug induced constipation: Secondary | ICD-10-CM | POA: Insufficient documentation

## 2012-02-12 DIAGNOSIS — I10 Essential (primary) hypertension: Secondary | ICD-10-CM

## 2012-02-12 DIAGNOSIS — N179 Acute kidney failure, unspecified: Secondary | ICD-10-CM

## 2012-02-12 DIAGNOSIS — B965 Pseudomonas (aeruginosa) (mallei) (pseudomallei) as the cause of diseases classified elsewhere: Secondary | ICD-10-CM

## 2012-02-12 NOTE — Assessment & Plan Note (Signed)
Try increasing fiber--- fibersure, metamucil  Etc con't stool softener

## 2012-02-12 NOTE — Assessment & Plan Note (Signed)
Stable con't med 

## 2012-02-12 NOTE — Assessment & Plan Note (Signed)
Improving Pt sees nephrology tomorrow

## 2012-02-12 NOTE — Assessment & Plan Note (Signed)
Per nephrology 

## 2012-02-12 NOTE — Progress Notes (Signed)
  Subjective:    Patient ID: [name], female    DOB: 1946/01/14, 66 y.o.   MRN: [id number]  HPI Pt here to f/u from NH.  Pt was d/c'd home.  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  Pt was also on dialysis secondary to antibiotics causing renal failure.  Per pt her bun / cr are coming down slowly.  Pt c/o constipation from pain meds.  The miralax daily caused diarrhea and QOD doesn't work.  Pt states she also is having buttock pain.  Her husband has looked and doesn't see anything.  Pt sits in wheelchair or bed all day.   Review of Systems    as above Objective:   Physical Exam  Constitutional: She is oriented to person, place, and time. She appears well-developed and well-nourished.  Cardiovascular: Normal rate, regular rhythm and normal heart sounds.   No murmur heard. Pulmonary/Chest: Effort normal and breath sounds normal. No respiratory distress. She has no wheezes.  Genitourinary:       No sores visualized on buttocks  Neurological: She is alert and oriented to person, place, and time.  Psychiatric: She has a normal mood and affect. Her behavior is normal. Judgment and thought content normal.          Assessment & Plan:

## 2012-02-12 NOTE — Assessment & Plan Note (Addendum)
Per surgery and ID They are waiting for infection to clear to re do hip replacement Encouraged pt to change position --(shift weight) frequently to prevent decubitus ulcers

## 2012-02-15 ENCOUNTER — Other Ambulatory Visit: Payer: Self-pay | Admitting: *Deleted

## 2012-02-18 ENCOUNTER — Other Ambulatory Visit: Payer: Self-pay | Admitting: Orthopedic Surgery

## 2012-02-18 DIAGNOSIS — T847XXA Infection and inflammatory reaction due to other internal orthopedic prosthetic devices, implants and grafts, initial encounter: Secondary | ICD-10-CM

## 2012-02-19 ENCOUNTER — Encounter (HOSPITAL_COMMUNITY): Payer: Medicare Other

## 2012-02-19 ENCOUNTER — Ambulatory Visit (HOSPITAL_COMMUNITY)
Admission: RE | Admit: 2012-02-19 | Discharge: 2012-02-19 | Disposition: A | Discharge status: Home / Self Care | Payer: Medicare Other | Source: Ambulatory Visit | Attending: Vascular Surgery | Admitting: Vascular Surgery

## 2012-02-19 DIAGNOSIS — N186 End stage renal disease: Secondary | ICD-10-CM | POA: Insufficient documentation

## 2012-02-19 DIAGNOSIS — Z452 Encounter for adjustment and management of vascular access device: Secondary | ICD-10-CM | POA: Insufficient documentation

## 2012-02-19 NOTE — Progress Notes (Signed)
[facility] SHORT STAY H&P  CC:  Catheter removal   HPI:  66 y/o female who present today for catheter removal.  She had superficialization of left UA AVF on 11/12/11 by Dr. Donnetta Hutching.  They are using her AVF successfully without difficulty.  Her catheter was placed in March 2013.  Past Medical History  Diagnosis Date  . Fractures, stress     in both feet  . 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 Gout     doesn't require meds   . Anemia   . Blood transfusion     never had a reaction to blood transfusion  . Arthritis     HIP  . Cardiomyopathy   . Hypercalcemia     09/10/11  . HCAP (healthcare-associated pneumonia)     09/10/11  . CHF (congestive heart failure)   . Hypertension     takes Diltiazem daily   . Atrial fibrillation     PT TAKES ASPIRIN AS BLOOD THINNER-HX OF CEREBRAL BLEED WHILE ON COUMADIN  . Arrhythmia     takes Metoprolol daily  . Asthma     as a child  . Eczema   . Constipation     takes Miralax daily  . Oligouria   . Depression     takes Zoloft daily  . Sleep apnea     uses CPAP-sleep study done 2007  . Stroke 2009    denies residual  . ESRD (end stage renal disease) on dialysis 09/2011    dialysis on M/W/F on [address]  . Peripheral vascular disease   . 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 Marital Status: Married    Spouse Name: N/A    Number of Children: N/A  . Years of Education: N/A   Occupational History  . Not on file.   Social History Main Topics  . Smoking status: Never Smoker   . Smokeless tobacco: Never Used  . Alcohol Use: No  . Drug Use: No  . Sexually Active: No   Other Topics Concern  . Not on file   Social History Narrative  . No narrative on file    Allergies  Allergen Reactions  . Warfarin Sodium Other (See Comments)    Caused her to have a stroke    Current Outpatient Prescriptions    Medication Sig Dispense Refill  . calcium acetate (PHOSLO) 667 MG capsule Take 667 mg by mouth 3 (three) times daily.       . ciprofloxacin (CIPRO) 500 MG tablet Take 500 mg by mouth daily at 6 PM.      . diltiazem (CARDIZEM CD) 240 MG 24 hr capsule Take 240 mg by mouth daily.      . diphenhydrAMINE (BENADRYL) 25 MG tablet Take 25 mg by mouth every 6 (six) hours as needed. Itching/rash      . feeding supplement (PRO-STAT SUGAR FREE 64) LIQD Take 30 mLs by mouth 2 (two) times daily.      . folic acid-vitamin b complex-vitamin c-selenium-zinc (DIALYVITE) 3 MG TABS Take 1 tablet by mouth daily.      Marland Kitchen HYDROcodone-acetaminophen (NORCO) 7.5-325 MG per tablet Take 1 tablet by mouth every 4 (four) hours as needed.      . metoprolol (LOPRESSOR) 50 MG tablet Take 50 mg by mouth 2 (two) times daily. Hold on mornings of  dialysis (M,W,F)      . Multiple Vitamins-Minerals (MULTIVITAMINS THER. W/MINERALS) TABS Take 1 tablet by mouth 2 (two) times daily.       . Nutritional Supplements (NEPRO PO) Take 1 Can by mouth daily.      . polyethylene glycol (MIRALAX / GLYCOLAX) packet Take 17 g by mouth 2 (two) times daily.      . sertraline (ZOLOFT) 50 MG tablet Take 50 mg by mouth every morning. Take before breakfast      . sodium chloride 0.9 % SOLN 100 mL with ferric gluconate 12.5 MG/ML SOLN 125 mg Inject 125 mg into the vein every Monday, Wednesday, and Friday with hemodialysis.      Marland Kitchen sotalol (BETAPACE) 80 MG tablet Take 1 tablet (80 mg total) by mouth every other day.      . vancomycin (VANCOCIN) 1 GM/200ML SOLN Inject 200 mLs (1,000 mg total) into the vein every Monday, Wednesday, and Friday with hemodialysis.  4000 mL    . DISCONTD: colchicine 0.6 MG tablet Take 1 tablet (0.6 mg total) by mouth daily.  31 tablet  0  . DISCONTD: furosemide (LASIX) 80 MG tablet Take 80-160 mg by mouth 2 (two) times daily. 2 tab in am and 1 tab      . DISCONTD: gabapentin (NEURONTIN) 300 MG capsule Take 300 mg by mouth 3 (three)  times daily.       Marland Kitchen DISCONTD: metoprolol (TOPROL XL) 100 MG 24 hr tablet Take 100 mg by mouth daily before breakfast.       . DISCONTD: potassium chloride SA (KLOR-CON M20) 20 MEQ tablet Take 20 mEq by mouth daily.       Marland Kitchen DISCONTD: rivaroxaban (XARELTO) 10 MG TABS tablet Take 1 tablet (10 mg total) by mouth daily.  14 tablet  0    ROS:  See HPI  PHYSICAL EXAM  Filed Vitals:   02/19/12 1044  BP: 162/89  Pulse: 81  Temp: 96.8 F (36 C)  Resp: 20    Gen:  NAD HEENT:  normocephalic Neck:  Right IJ diatek cath in place Heart:  RRR Lungs:  Non labored Extremities:  + bilateral palpable radial pulses Skin:  No obvious rashes Neuro:  In tact  Lab/X-ray:  Impression: This is a 66 y.o. female here for diatek catheter removal  Plan:  Removal of right diatek catheter  Leontine Locket, PA-C [facility] [phone] 02/19/2012 10:55 AM

## 2012-02-19 NOTE — Progress Notes (Signed)
[facility] Catheter Removal Procedure Note  Diagnosis: ESRD  Plan:  Remove right diatek catheter  Consent signed:  yes Time out completed:  yes Coumadin:  no PT/INR (if applicable):   Other labs:  Procedure: 1.  Sterile prepping and draping over catheter area 2. 7 ml 2% lidocaine plain instilled at removal site. 3.  right catheter removed in its entirety with cuff in tact. 4.  Complications:  none 5. Tip of catheter sent for culture:  no   Patient tolerated procedure well:  yes Pressure held, no bleeding noted, dressing applied Instructions given to the pt regarding wound care and bleeding.  Other:  Leontine Locket, PA-C 02/19/2012 11:02 AM

## 2012-02-20 ENCOUNTER — Other Ambulatory Visit: Payer: Self-pay | Admitting: Orthopedic Surgery

## 2012-02-20 DIAGNOSIS — T847XXA Infection and inflammatory reaction due to other internal orthopedic prosthetic devices, implants and grafts, initial encounter: Secondary | ICD-10-CM

## 2012-02-21 ENCOUNTER — Other Ambulatory Visit: Payer: Medicare Other

## 2012-02-21 ENCOUNTER — Ambulatory Visit
Admission: RE | Admit: 2012-02-21 | Discharge: 2012-02-21 | Disposition: A | Discharge status: Home / Self Care | Payer: Medicare Other | Source: Ambulatory Visit | Attending: Orthopedic Surgery | Admitting: Orthopedic Surgery

## 2012-02-21 DIAGNOSIS — T847XXA Infection and inflammatory reaction due to other internal orthopedic prosthetic devices, implants and grafts, initial encounter: Secondary | ICD-10-CM

## 2012-02-21 DIAGNOSIS — T8459XA Infection and inflammatory reaction due to other internal joint prosthesis, initial encounter: Secondary | ICD-10-CM

## 2012-02-21 DIAGNOSIS — D72829 Elevated white blood cell count, unspecified: Secondary | ICD-10-CM

## 2012-02-21 DIAGNOSIS — Z89629 Acquired absence of unspecified hip joint: Secondary | ICD-10-CM

## 2012-02-21 LAB — SYNOVIAL CELL COUNT + DIFF, W/ CRYSTALS
Crystals, Fluid: NONE SEEN
Eosinophils-Synovial: 1 % (ref 0–1)
Lymphocytes-Synovial Fld: 27 % — ABNORMAL HIGH (ref 0–20)
Monocyte/Macrophage: 28 % — ABNORMAL LOW (ref 50–90)
Neutrophil, Synovial: 44 % — ABNORMAL HIGH (ref 0–25)
WBC, Synovial: 290 cu mm — ABNORMAL HIGH (ref 0–200)

## 2012-02-21 IMAGING — CT CT BIOPSY
1 series · 14 of 32 positions shown, 18 images · non-contrast
Comparison: none

CLINICAL DATA: Infected right total hip arthroplasty with
resection.  Fluid collections around the resected prosthesis.

CT GUIDED ASPIRATION OF RIGHT HIP FLUID COLLECTIONS
TECHNIQUE: Written and verbal informed consent was obtained after
thorough discussion of risks and benefits of the procedure.
Consent was obtained by Dr. RANTONA.  General risks include bleeding,
infection, injury to nerves, blood vessels, and adjacent
structures.  Specific risks included failure to aspirate from the
fluid collections.  False positive and false negative associated
with laboratory analysis were also discussed as a risks.

[Series 2: routine pelvis · axial · 0.70mm/px · z∈[-271,-94]mm · 14 of 202 slices shown, 18 images]
[im 13/202  soft-tissue]
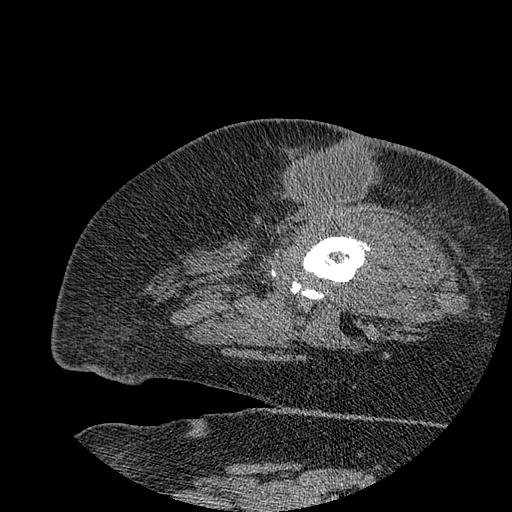
[im 13/202  bone]
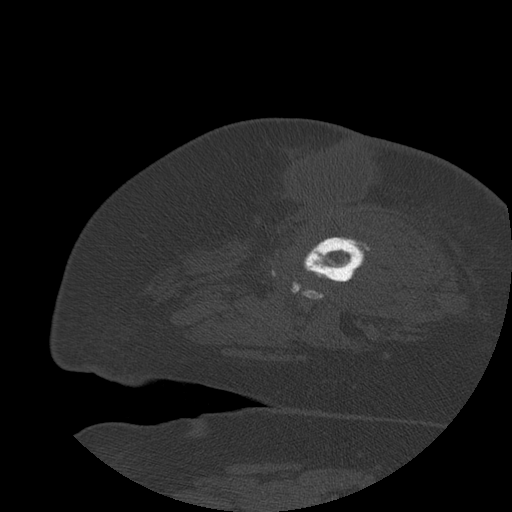
[im 26/202  soft-tissue]
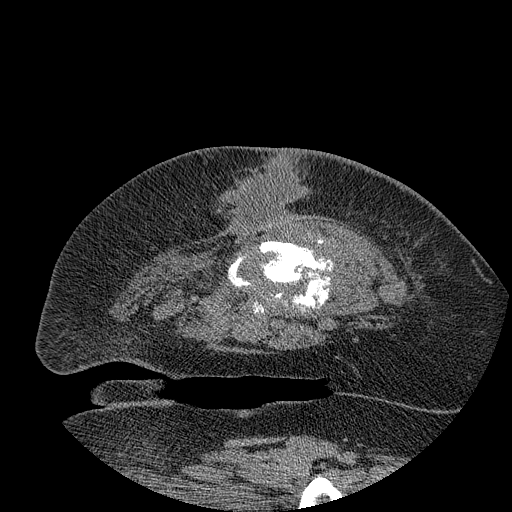
[im 46/202  soft-tissue]
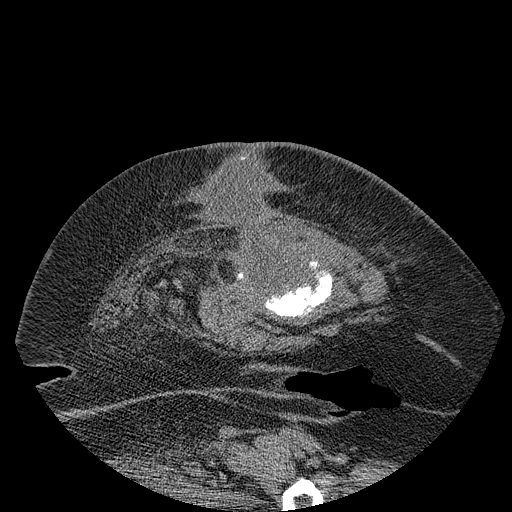
[im 59/202  soft-tissue]
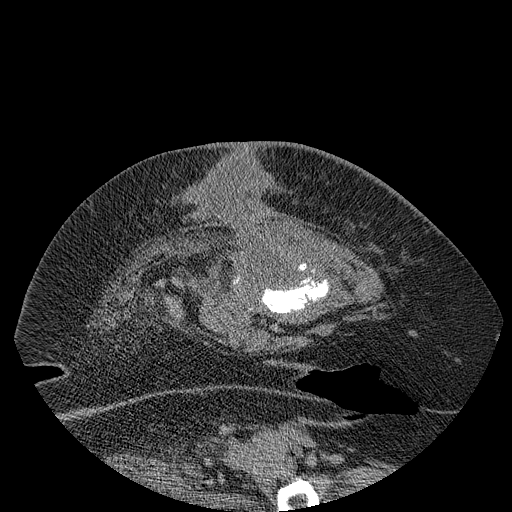
[im 78/202  soft-tissue]
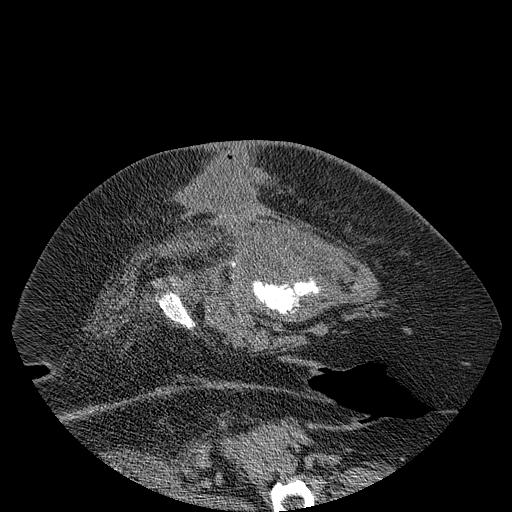
[im 91/202  soft-tissue]
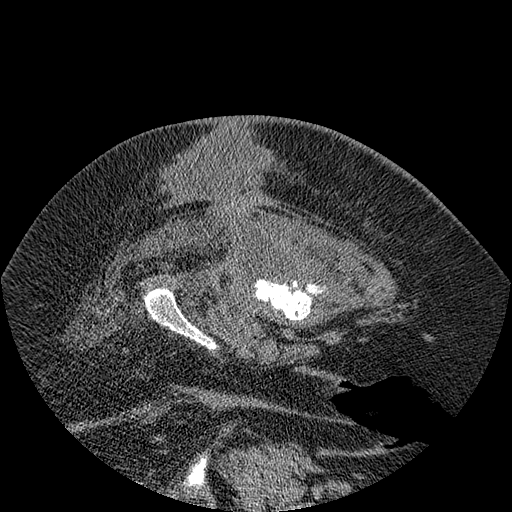
[im 111/202  soft-tissue]
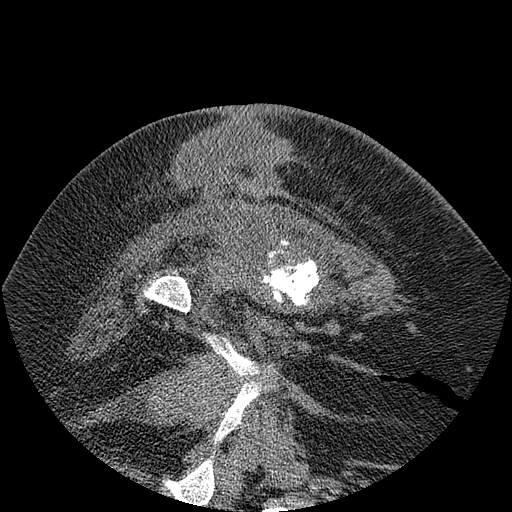
[im 124/202  soft-tissue]
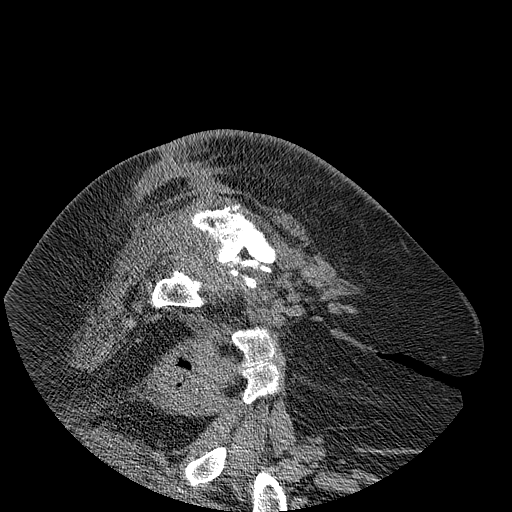
[im 143/202  soft-tissue]
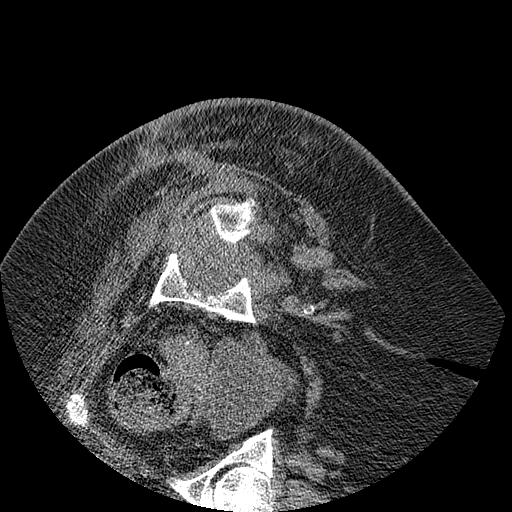
[im 143/202  bone]
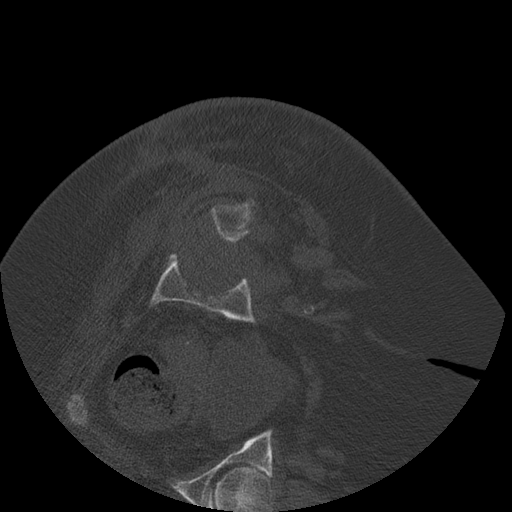
[im 156/202  soft-tissue]
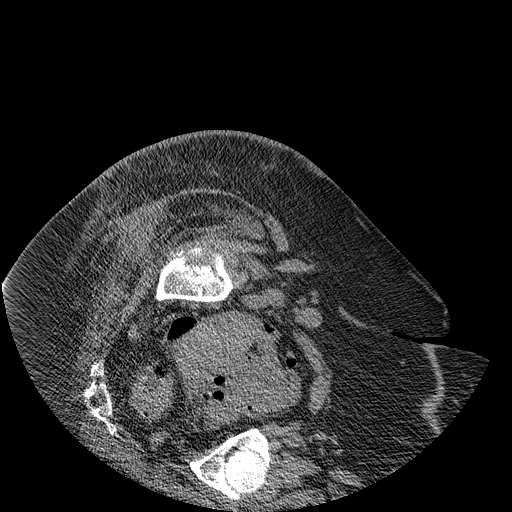
[im 176/202  soft-tissue]
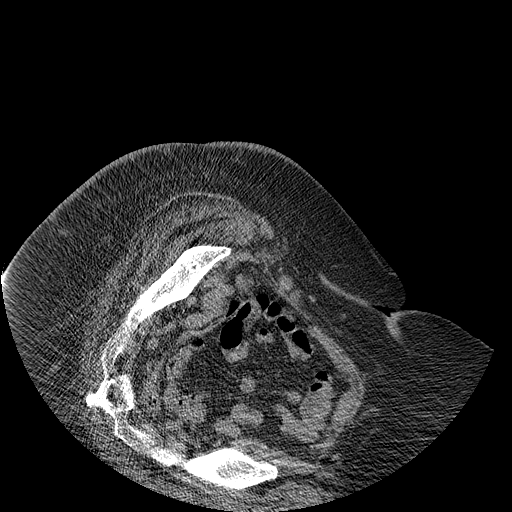
[im 176/202  lung]
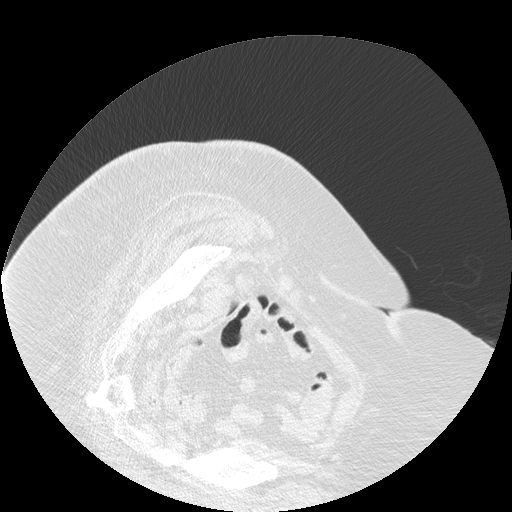
[im 182/202  lung]
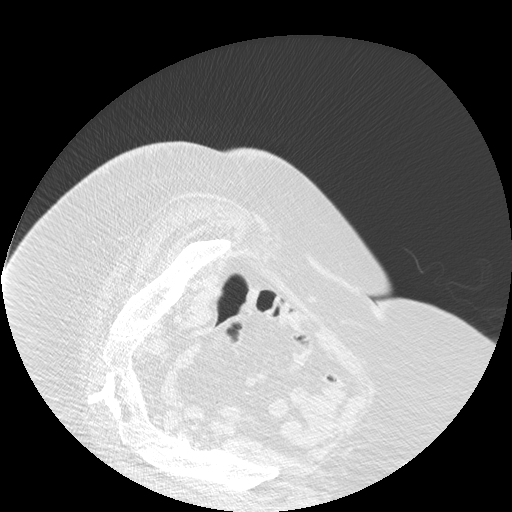
[im 189/202  soft-tissue]
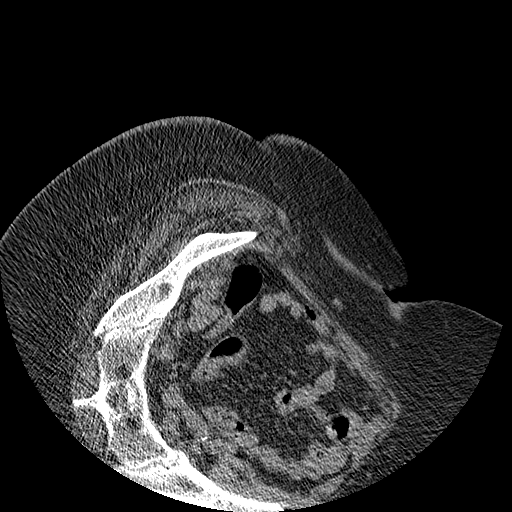
[im 189/202  lung]
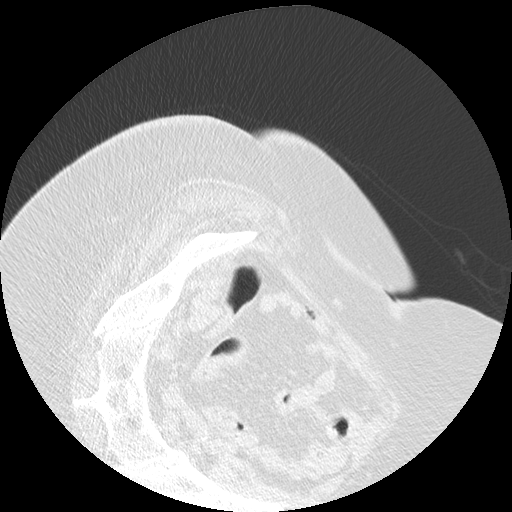
[im 195/202  lung]
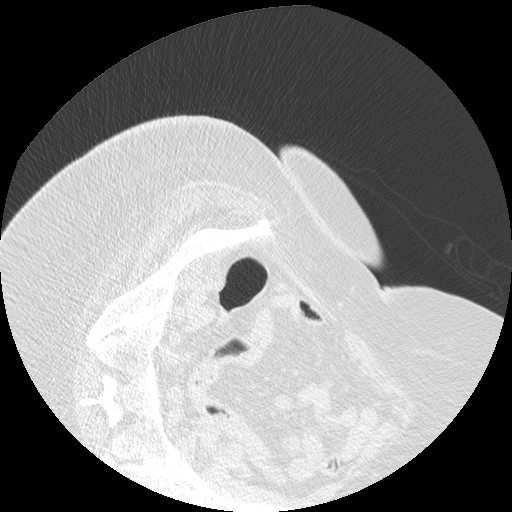

[14 of 32 positions shown; findings below may reference images not displayed]

The patient agreed to proceed and understood the risks.  The
patient was positioned with the right hip up on the CT table.
Preliminary scanning was performed which demonstrated superficial
and deep fluid collections lateral to the expected location of the
right hip, intertrochanteric femur, and proximal right femoral
diaphysis.

Under CT guidance, target site was selected [REDACTED] pass through
both the superficial and deep fluid collections.  The skin was
prepped and draped in the usual sterile fashion.  Local anesthesia
was provided with 1% lidocaine without epinephrine. 2 ml of 1%
lidocaine were used for anesthesia.

Deeper anesthesia was provided with 2% lidocaine without
epinephrine with careful technique to avoid injecting lidocaine
into the fluid collections which could interfere with laboratory
analysis. 3 ml of 2% lidocaine were used for anesthesia.

Subsequently, an 18 gauge needle was advanced into the superficial
fluid collection and 20 ml of serosanguinous fluid was aspirated.
This was labeled superficial collection.  The needle was then
advanced under CT guidance further into the deep fluid collection
and another 20 ml of serosanguinous fluid was aspirated.  This was
labeled deep collection.  Needle position was confirmed with CT.
Subsequently, the needle was slowly withdrawn and a further 50 ml
of serosanguinous fluid was aspirated.  There was no frank pus.
The patient tolerated the procedure well.  Post procedure scan was
performed which demonstrated tiny locule of gas within the
superficial fluid collection.  Both collections had decreased in
size.  No complicating features on post procedure scan.
FINDINGS: Resection of the right femoral head.  Portions of the
greater trochanter are present in the region of the right hip.
This is discontinuous with the proximal right femoral shaft.
Heterotopic bone is present around the proximal aspect of the
femoral shaft.
IMPRESSION: Successful CT guided aspiration of both superficial and deep fluid
collections around the right hip. Aspirated fluid was
serosanguinous.  Laboratory results are pending.

## 2012-02-24 LAB — BODY FLUID CULTURE
Gram Stain: NONE SEEN
Organism ID, Bacteria: NO GROWTH

## 2012-03-11 ENCOUNTER — Encounter (HOSPITAL_COMMUNITY): Payer: Self-pay | Admitting: Pharmacy Technician

## 2012-03-11 ENCOUNTER — Encounter (HOSPITAL_COMMUNITY): Payer: Self-pay

## 2012-03-11 ENCOUNTER — Encounter (HOSPITAL_COMMUNITY)
Admission: RE | Admit: 2012-03-11 | Discharge: 2012-03-11 | Disposition: A | Discharge status: Home / Self Care | Payer: Medicare Other | Source: Ambulatory Visit | Attending: Orthopedic Surgery | Admitting: Orthopedic Surgery

## 2012-03-11 LAB — SURGICAL PCR SCREEN
MRSA, PCR: POSITIVE — AB
Staphylococcus aureus: POSITIVE — AB

## 2012-03-11 NOTE — Pre-Procedure Instructions (Addendum)
PREOP NASAL PCR WAS DONE TODAY AT [facility]  PT DOES NOT YET HAVE ANY PREOP ORDERS IN EPIC FROM DR. OLIN--AND PT IS ON DIALYSIS M W F AND SURGERY NOT UNTIL 9/10--SO NOT ABLE TO DO ANY PREOP BLOOD WORK PER ANESTHESIA TODAY.  I GAVE PT URINE CUP / WIPES TO OBTAIN URINE SPECIMEN DAY OF SURGERY AND BRING TO SHORT STAY.  PT HAS EKG REPORT IN EPIC FROM [facility] / ED DONE 12/07/11 AND CXR REPORT IN EPIC FROM 12/08/11 [place].   PT HAS CARDIOLOGY OFFICE NOTE / EKG FROM DR. TRACI TURNER 08/16/11-REPORTS PLACED ON PT'S CHART.   PT SIGNED CONSENT FOR BLOOD--WILL HAVE TO SIGN OR CONSENT ON ARRIVAL FOR SURGERY--NEED PREOP ORDER FOR OR CONSENT.  PT STATES SHE HAS APPOINTMENT IN DR. Aurea Graff OFFICE 9/5 TO SEE PA. PREOP TEACHING DISCUSSED WITH PT USING TEACH BACK METHOD.

## 2012-03-11 NOTE — Patient Instructions (Signed)
YOUR SURGERY IS SCHEDULED ON:  Tuesday  9/10  AT 2:45 PM  REPORT TO [place] SHORT STAY CENTER AT:  12:00 PM      PHONE # FOR SHORT STAY IS [phone]  DO NOT EAT ANYTHING AFTER MIDNIGHT THE NIGHT BEFORE YOUR SURGERY.  NO FOOD, NO CHEWING GUM, NO MINTS, NO CANDIES, NO CHEWING TOBACCO. YOU MAY HAVE CLEAR LIQUIDS TO DRINK FROM MIDNIGHT THE NIGHT BEFORE SURGERY UNTIL 8:30 AM DAY OF SURGERY - LIKE WATER, GRAPE JUICE, GINGERALE, COFFEE-JUST NO MILK OR CREAM.    NOTHING TO DRINK AFTER 8:30 AM DAY OF SURGERY.  PLEASE TAKE THE FOLLOWING MEDICATIONS THE AM OF YOUR SURGERY WITH A FEW SIPS OF WATER:  DILTIAZEM, HYDROCODONE / ACETAMINOPHEN, METOPROLOL, ZOLOFT,  AND IF IT IS THE DAY YOU NEED TO TAKE YOUR SOTOLOL-PLEASE TAKE.    IF YOU USE INHALERS--USE YOUR INHALERS THE AM OF YOUR SURGERY AND BRING INHALERS TO [place] TO SURGERY.    IF YOU ARE DIABETIC:  DO NOT TAKE ANY DIABETIC MEDICATIONS THE AM OF YOUR SURGERY.  IF YOU TAKE INSULIN IN THE EVENINGS--PLEASE ONLY TAKE 1/2 NORMAL EVENING DOSE THE NIGHT BEFORE YOUR SURGERY.  NO INSULIN THE AM OF YOUR SURGERY.  IF YOU HAVE SLEEP APNEA AND USE CPAP OR BIPAP--PLEASE BRING THE MASK --NOT THE MACHINE-NOT THE TUBING   -JUST THE MASK. DO NOT BRING VALUABLES, MONEY, CREDIT CARDS.  CONTACT LENS, DENTURES / PARTIALS, GLASSES SHOULD NOT BE WORN TO SURGERY AND IN MOST CASES-HEARING AIDS WILL NEED TO BE REMOVED.  BRING YOUR GLASSES CASE, ANY EQUIPMENT NEEDED FOR YOUR CONTACT LENS. FOR PATIENTS ADMITTED TO THE HOSPITAL--CHECK OUT TIME THE DAY OF DISCHARGE IS 11:00 AM.  ALL INPATIENT ROOMS ARE PRIVATE - WITH BATHROOM, TELEPHONE, TELEVISION AND WIFI INTERNET. IF YOU ARE BEING DISCHARGED THE SAME DAY OF YOUR SURGERY--YOU CAN NOT DRIVE YOURSELF HOME--AND SHOULD NOT GO HOME ALONE BY TAXI OR BUS.  NO DRIVING OR OPERATING MACHINERY FOR 24 HOURS FOLLOWING ANESTHESIA / PAIN MEDICATIONS.                            SPECIAL INSTRUCTIONS:  CHLORHEXIDINE SOAP SHOWER  (other brand names are Betasept and Hibiclens ) PLEASE SHOWER WITH CHLORHEXIDINE THE NIGHT BEFORE YOUR SURGERY AND THE AM OF YOUR SURGERY. DO NOT USE CHLORHEXIDINE ON YOUR FACE OR PRIVATE AREAS--YOU MAY USE YOUR NORMAL SOAP THOSE AREAS AND YOUR NORMAL SHAMPOO.  WOMEN SHOULD AVOID SHAVING UNDER ARMS AND SHAVING LEGS 71 HOURS BEFORE USING CHLORHEXIDINE TO AVOID SKIN IRRITATION.  DO NOT USE IF ALLERGIC TO CHLORHEXIDINE.  PLEASE READ OVER ANY  FACT SHEETS THAT YOU WERE GIVEN: MRSA INFORMATION, BLOOD TRANSFUSION INFORMATION, INCENTIVE SPIROMETER INFORMATION.

## 2012-03-20 NOTE — H&P (Signed)
[name] is an 66 y.o. female.    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 She utilized a wound VAC for wound closure for a while, but had elevated sed rate and CRP. Subsequent surgery removed the prosthesis, washed out the area and implantation of a antibiotic spacer. On a subsequent office vist the patient started to have right hip pain, an MRI was obtained which showed a collection of fluid. With the patient's history and the MRI findings there is a potential of infection in the right hip. She then had an I&D of the right hip, changing of the antibiotic spacer and wound closure.  She also had surgery to have an IVC filter placed after a diagnosis of DVTs in the LLE.  She has cleared up of all issues involving the right hip, and has had a long discussion on another surgery to fix her hip. Risks, benefits and expectations were discussed with the patient. Patient understand the risks, benefits and expectations and wishes to proceed with surgery. Discussion has been had and she understands the risks including, but not limited to infection, nerve or blood vessel, failure of the prosthesis, delayed or non-healing, no guarantee of the same leg length, risk of anesthesia and everything up to and including death. Again she understand and wishes to proceed.    PCP:  Garnet Koyanagi, DO  D/C Plans:   SNF - Hopes to return to [facility]  Post-op Meds:  No Rx given   Tranexamic Acid:   Not to be given - Previous blood clots  Decadron:   Not to be given  PMH: Past Medical History  Diagnosis Date  . Fractures, stress     in both feet--6 OR 7 YRS AGO--RESOLVED  . 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 Gout     doesn't require meds   . Anemia   . Blood transfusion     never had a  reaction to blood transfusion  . Arthritis     HIP  . Cardiomyopathy   . Hypercalcemia     09/10/11  . HCAP (healthcare-associated pneumonia)     09/10/11  . CHF (congestive heart failure)   . Hypertension     takes Diltiazem daily   . Asthma     as a child  . Eczema   . Constipation     takes Miralax daily  . Oligouria   . Depression     takes Zoloft daily  . Stroke 2009    denies residual  . ESRD (end stage renal disease) on dialysis 09/2011    dialysis on M/W/F on [address]  . Peripheral vascular disease   . DVT of lower extremity, bilateral 12/21/11    "they're there now; been there for 2 wks"  . Atrial fibrillation      HX OF CEREBRAL BLEED WHILE ON COUMADIN-SO PT NOT ON ANY BLOOD THINNERS NOW  . Arrhythmia     takes Metoprolol daily  . Sleep apnea     HAS CPAP-sleep study done 2007--PT NOT USING AT PRESENT    PSH: Past Surgical History  Procedure Date  . Cardiac valve surgery 1960     FOR ATRIAL SEPTAL DEFECT  . Joint replacement     right hip and right femur x 2  . I&d  extremity 09/15/2011    Procedure: IRRIGATION AND DEBRIDEMENT EXTREMITY;  Surgeon: [name], MD;  Location: [facility];  Service: Orthopedics;  Laterality: Right;  . Av fistula placement 09/28/2011    Procedure: ARTERIOVENOUS (AV) FISTULA CREATION;  Surgeon: Rosetta Posner, MD;  Location: [facility];  Service: Vascular;  Laterality: Left;  . Cystoscopy     many yrs ago  . Colonoscopy   . Irrigation and debridement abscess 12/21/11    right hip  . Vena cava filter placement 11/2011  . Total hip arthroplasty 02/2011    right  . Femur fracture surgery 03/2011; 09/03/2011    right; "had 2, 2 wk apart in 2012; broke it again 08/2011 & had OR"  . Insertion of dialysis catheter     Procedure: INSERTION OF DIALYSIS CATHETER;  Surgeon: Rosetta Posner, MD;  Location: [facility];  Service: Vascular;  Laterality: Right;  . Cholecystectomy     1980's  . Incision and drainage hip 12/21/2011    Procedure: IRRIGATION AND  DEBRIDEMENT HIP;  Surgeon: [name], MD;  Location: [facility];  Service: Orthopedics;  Laterality: Right;  I&D RIGHT HIP WITH PLACEMENT ANTIBIOTIC SPACER    Social History:  reports that she has never smoked. She has never used smokeless tobacco. She reports that she does not drink alcohol or use illicit drugs.  Allergies:  Allergies  Allergen Reactions  . Warfarin Sodium Other (See Comments)    Caused her to have a stroke    Medications: No current facility-administered medications for this encounter.   Current Outpatient Prescriptions  Medication Sig Dispense Refill  . acetaminophen (TYLENOL) 500 MG tablet Take 1,000 mg by mouth every 6 (six) hours as needed. Pain      . calcium acetate (PHOSLO) 667 MG capsule Take 667 mg by mouth 3 (three) times daily.       Marland Kitchen diltiazem (CARDIZEM CD) 240 MG 24 hr capsule Take 240 mg by mouth daily after breakfast.       . diphenhydrAMINE (BENADRYL) 25 MG tablet Take 25 mg by mouth every 6 (six) hours as needed. Itching/rash      . docusate sodium (COLACE) 100 MG capsule Take 100 mg by mouth daily.      . feeding supplement (PRO-STAT SUGAR FREE 64) LIQD Take 30 mLs by mouth 2 (two) times daily.      . folic acid-vitamin b complex-vitamin c-selenium-zinc (DIALYVITE) 3 MG TABS Take 1 tablet by mouth daily.      Marland Kitchen HYDROcodone-acetaminophen (NORCO) 7.5-325 MG per tablet Take 1 tablet by mouth every 4 (four) hours as needed. Pain      . metoprolol (LOPRESSOR) 50 MG tablet Take 50 mg by mouth 2 (two) times daily. Hold on mornings of dialysis (M,W,F)      . polyethylene glycol (MIRALAX / GLYCOLAX) packet Take 17 g by mouth 2 (two) times daily.      . sertraline (ZOLOFT) 50 MG tablet Take 50 mg by mouth every morning. Take before breakfast      . sotalol (BETAPACE) 80 MG tablet Take 80 mg by mouth every other day.      Marland Kitchen DISCONTD: colchicine 0.6 MG tablet Take 1 tablet (0.6 mg total) by mouth daily.  31 tablet  0  . DISCONTD: furosemide (LASIX) 80 MG tablet  Take 80-160 mg by mouth 2 (two) times daily. 2 tab in am and 1 tab      . DISCONTD: gabapentin (NEURONTIN) 300 MG capsule Take 300  mg by mouth 3 (three) times daily.       Marland Kitchen DISCONTD: metoprolol (TOPROL XL) 100 MG 24 hr tablet Take 100 mg by mouth daily before breakfast.       . DISCONTD: potassium chloride SA (KLOR-CON M20) 20 MEQ tablet Take 20 mEq by mouth daily.       Marland Kitchen DISCONTD: rivaroxaban (XARELTO) 10 MG TABS tablet Take 1 tablet (10 mg total) by mouth daily.  14 tablet  0    ROS: Review of Systems  Constitutional: Positive for weight loss and malaise/fatigue.  HENT: Negative.   Eyes: Negative.   Respiratory: Negative.   Cardiovascular: Negative.   Gastrointestinal: Positive for constipation.  Genitourinary: Positive for dysuria (On dialysis, however still outputs about 1-2 pints a day).  Musculoskeletal: Positive for myalgias.  Skin: Negative.   Neurological: Positive for weakness.  Endo/Heme/Allergies: Negative.   Psychiatric/Behavioral: Negative.      Physical Exam: BP:   146/85  ;  HR:   78  ; Resp:   14  ; Physical Exam  Constitutional: She is oriented to person, place, and time. Vital signs are normal. No distress.  HENT:  Head: Normocephalic and atraumatic.  Nose: Nose normal.  Mouth/Throat: Oropharynx is clear and moist.  Eyes: Pupils are equal, round, and reactive to light.  Neck: Neck supple. No JVD present. No tracheal deviation present. No thyromegaly present.  Cardiovascular: Normal rate, regular rhythm, normal heart sounds and intact distal pulses.   Pulmonary/Chest: Effort normal and breath sounds normal. No stridor. No respiratory distress. She has no wheezes.  Abdominal: Soft. There is no tenderness. There is no guarding.  Musculoskeletal:       Right hip: She exhibits decreased range of motion, decreased strength, deformity and laceration (healed previous surgical incision). She exhibits no tenderness, no bony tenderness, no swelling and no crepitus.    Lymphadenopathy:    She has no cervical adenopathy.  Neurological: She is alert and oriented to person, place, and time.  Skin: Skin is warm and dry.  Psychiatric: Affect normal.      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 Alvan Dame at [facility] Risks benefits and expectations were discussed with the patient. Patient understand risks, benefits and expectations and wishes to proceed.   West Pugh Lailoni Baquera   PAC  03/20/2012, 2:39 PM

## 2012-03-24 NOTE — Pre-Procedure Instructions (Signed)
Spoke with daughter terrilee laverdure aware surgery time chabged to 1630, clear liquids midnight until 1030am, then npo arrive 1400 pm short stay 03-25-2012

## 2012-03-25 ENCOUNTER — Encounter (HOSPITAL_COMMUNITY): Payer: Self-pay | Admitting: Anesthesiology

## 2012-03-25 ENCOUNTER — Encounter (HOSPITAL_COMMUNITY): Payer: Self-pay | Admitting: *Deleted

## 2012-03-25 ENCOUNTER — Encounter (HOSPITAL_COMMUNITY)
Admission: RE | Disposition: A | Discharge status: Skilled Nursing Facility | Payer: Self-pay | Source: Ambulatory Visit | Attending: Orthopedic Surgery

## 2012-03-25 ENCOUNTER — Inpatient Hospital Stay (HOSPITAL_COMMUNITY)
Admission: RE | Admit: 2012-03-25 | Discharge: 2012-03-31 | DRG: 466 | Disposition: A | Discharge status: Skilled Nursing Facility | Payer: Medicare Other | Source: Ambulatory Visit | Attending: Orthopedic Surgery | Admitting: Orthopedic Surgery

## 2012-03-25 ENCOUNTER — Inpatient Hospital Stay (HOSPITAL_COMMUNITY): Payer: Medicare Other

## 2012-03-25 ENCOUNTER — Inpatient Hospital Stay (HOSPITAL_COMMUNITY): Payer: Medicare Other | Admitting: Anesthesiology

## 2012-03-25 DIAGNOSIS — D649 Anemia, unspecified: Secondary | ICD-10-CM

## 2012-03-25 DIAGNOSIS — Z8673 Personal history of transient ischemic attack (TIA), and cerebral infarction without residual deficits: Secondary | ICD-10-CM

## 2012-03-25 DIAGNOSIS — I959 Hypotension, unspecified: Secondary | ICD-10-CM

## 2012-03-25 DIAGNOSIS — Z01818 Encounter for other preprocedural examination: Secondary | ICD-10-CM

## 2012-03-25 DIAGNOSIS — I739 Peripheral vascular disease, unspecified: Secondary | ICD-10-CM | POA: Diagnosis present

## 2012-03-25 DIAGNOSIS — F3289 Other specified depressive episodes: Secondary | ICD-10-CM | POA: Diagnosis present

## 2012-03-25 DIAGNOSIS — G4730 Sleep apnea, unspecified: Secondary | ICD-10-CM | POA: Diagnosis present

## 2012-03-25 DIAGNOSIS — I4891 Unspecified atrial fibrillation: Secondary | ICD-10-CM

## 2012-03-25 DIAGNOSIS — I509 Heart failure, unspecified: Secondary | ICD-10-CM | POA: Diagnosis present

## 2012-03-25 DIAGNOSIS — F329 Major depressive disorder, single episode, unspecified: Secondary | ICD-10-CM | POA: Diagnosis present

## 2012-03-25 DIAGNOSIS — N186 End stage renal disease: Secondary | ICD-10-CM

## 2012-03-25 DIAGNOSIS — N179 Acute kidney failure, unspecified: Secondary | ICD-10-CM

## 2012-03-25 DIAGNOSIS — D500 Iron deficiency anemia secondary to blood loss (chronic): Secondary | ICD-10-CM

## 2012-03-25 DIAGNOSIS — I9589 Other hypotension: Secondary | ICD-10-CM | POA: Diagnosis present

## 2012-03-25 DIAGNOSIS — I120 Hypertensive chronic kidney disease with stage 5 chronic kidney disease or end stage renal disease: Secondary | ICD-10-CM | POA: Diagnosis present

## 2012-03-25 DIAGNOSIS — D62 Acute posthemorrhagic anemia: Secondary | ICD-10-CM | POA: Diagnosis not present

## 2012-03-25 DIAGNOSIS — N2581 Secondary hyperparathyroidism of renal origin: Secondary | ICD-10-CM | POA: Diagnosis present

## 2012-03-25 DIAGNOSIS — Z96649 Presence of unspecified artificial hip joint: Secondary | ICD-10-CM

## 2012-03-25 DIAGNOSIS — I428 Other cardiomyopathies: Secondary | ICD-10-CM | POA: Diagnosis present

## 2012-03-25 DIAGNOSIS — Z79899 Other long term (current) drug therapy: Secondary | ICD-10-CM

## 2012-03-25 DIAGNOSIS — J45909 Unspecified asthma, uncomplicated: Secondary | ICD-10-CM

## 2012-03-25 DIAGNOSIS — Z89629 Acquired absence of unspecified hip joint: Secondary | ICD-10-CM

## 2012-03-25 DIAGNOSIS — Z23 Encounter for immunization: Secondary | ICD-10-CM

## 2012-03-25 DIAGNOSIS — M21859 Other specified acquired deformities of unspecified thigh: Principal | ICD-10-CM | POA: Diagnosis present

## 2012-03-25 DIAGNOSIS — Z86718 Personal history of other venous thrombosis and embolism: Secondary | ICD-10-CM

## 2012-03-25 DIAGNOSIS — Z992 Dependence on renal dialysis: Secondary | ICD-10-CM

## 2012-03-25 DIAGNOSIS — K5900 Constipation, unspecified: Secondary | ICD-10-CM | POA: Diagnosis present

## 2012-03-25 HISTORY — PX: TOTAL HIP REVISION: SHX763

## 2012-03-25 LAB — BASIC METABOLIC PANEL
BUN: 15 mg/dL (ref 6–23)
CO2: 32 mEq/L (ref 19–32)
Calcium: 9.9 mg/dL (ref 8.4–10.5)
Chloride: 98 mEq/L (ref 96–112)
Creatinine, Ser: 2.4 mg/dL — ABNORMAL HIGH (ref 0.50–1.10)
GFR calc Af Amer: 24 mL/min — ABNORMAL LOW (ref >90)
GFR calc non Af Amer: 20 mL/min — ABNORMAL LOW (ref >90)
Glucose, Bld: 90 mg/dL (ref 70–99)
Potassium: 3.3 mEq/L — ABNORMAL LOW (ref 3.5–5.1)
Sodium: 138 mEq/L (ref 135–145)

## 2012-03-25 LAB — URINALYSIS, ROUTINE W REFLEX MICROSCOPIC
Glucose, UA: NEGATIVE mg/dL
Hgb urine dipstick: NEGATIVE
Nitrite: NEGATIVE
Protein, ur: 100 mg/dL — AB
Specific Gravity, Urine: 1.024 (ref 1.005–1.030)
Urobilinogen, UA: 0.2 mg/dL (ref 0.0–1.0)
pH: 5.5 (ref 5.0–8.0)

## 2012-03-25 LAB — CBC
HCT: 38.7 % (ref 36.0–46.0)
Hemoglobin: 12.3 g/dL (ref 12.0–15.0)
MCH: 25.9 pg — ABNORMAL LOW (ref 26.0–34.0)
MCHC: 31.8 g/dL (ref 30.0–36.0)
MCV: 81.5 fL (ref 78.0–100.0)
Platelets: 291 10*3/uL (ref 150–400)
RBC: 4.75 MIL/uL (ref 3.87–5.11)
RDW: 18.1 % — ABNORMAL HIGH (ref 11.5–15.5)
WBC: 7.6 10*3/uL (ref 4.0–10.5)

## 2012-03-25 LAB — GLUCOSE, CAPILLARY: Glucose-Capillary: 133 mg/dL — ABNORMAL HIGH (ref 70–99)

## 2012-03-25 LAB — URINE MICROSCOPIC-ADD ON

## 2012-03-25 LAB — PROTIME-INR
INR: 0.98 (ref 0.00–1.49)
Prothrombin Time: 13.2 seconds (ref 11.6–15.2)

## 2012-03-25 LAB — TYPE AND SCREEN
ABO/RH(D): O POS
Antibody Screen: NEGATIVE

## 2012-03-25 LAB — APTT: aPTT: 30 seconds (ref 24–37)

## 2012-03-25 IMAGING — CR DG HIP 1V PORT*R*
2 series · 3 of 3 positions shown · non-contrast
Comparison: None.

CLINICAL DATA: Postop right hip surgery.

PORTABLE RIGHT HIP - 1 VIEW

[AP (1 of 2)]
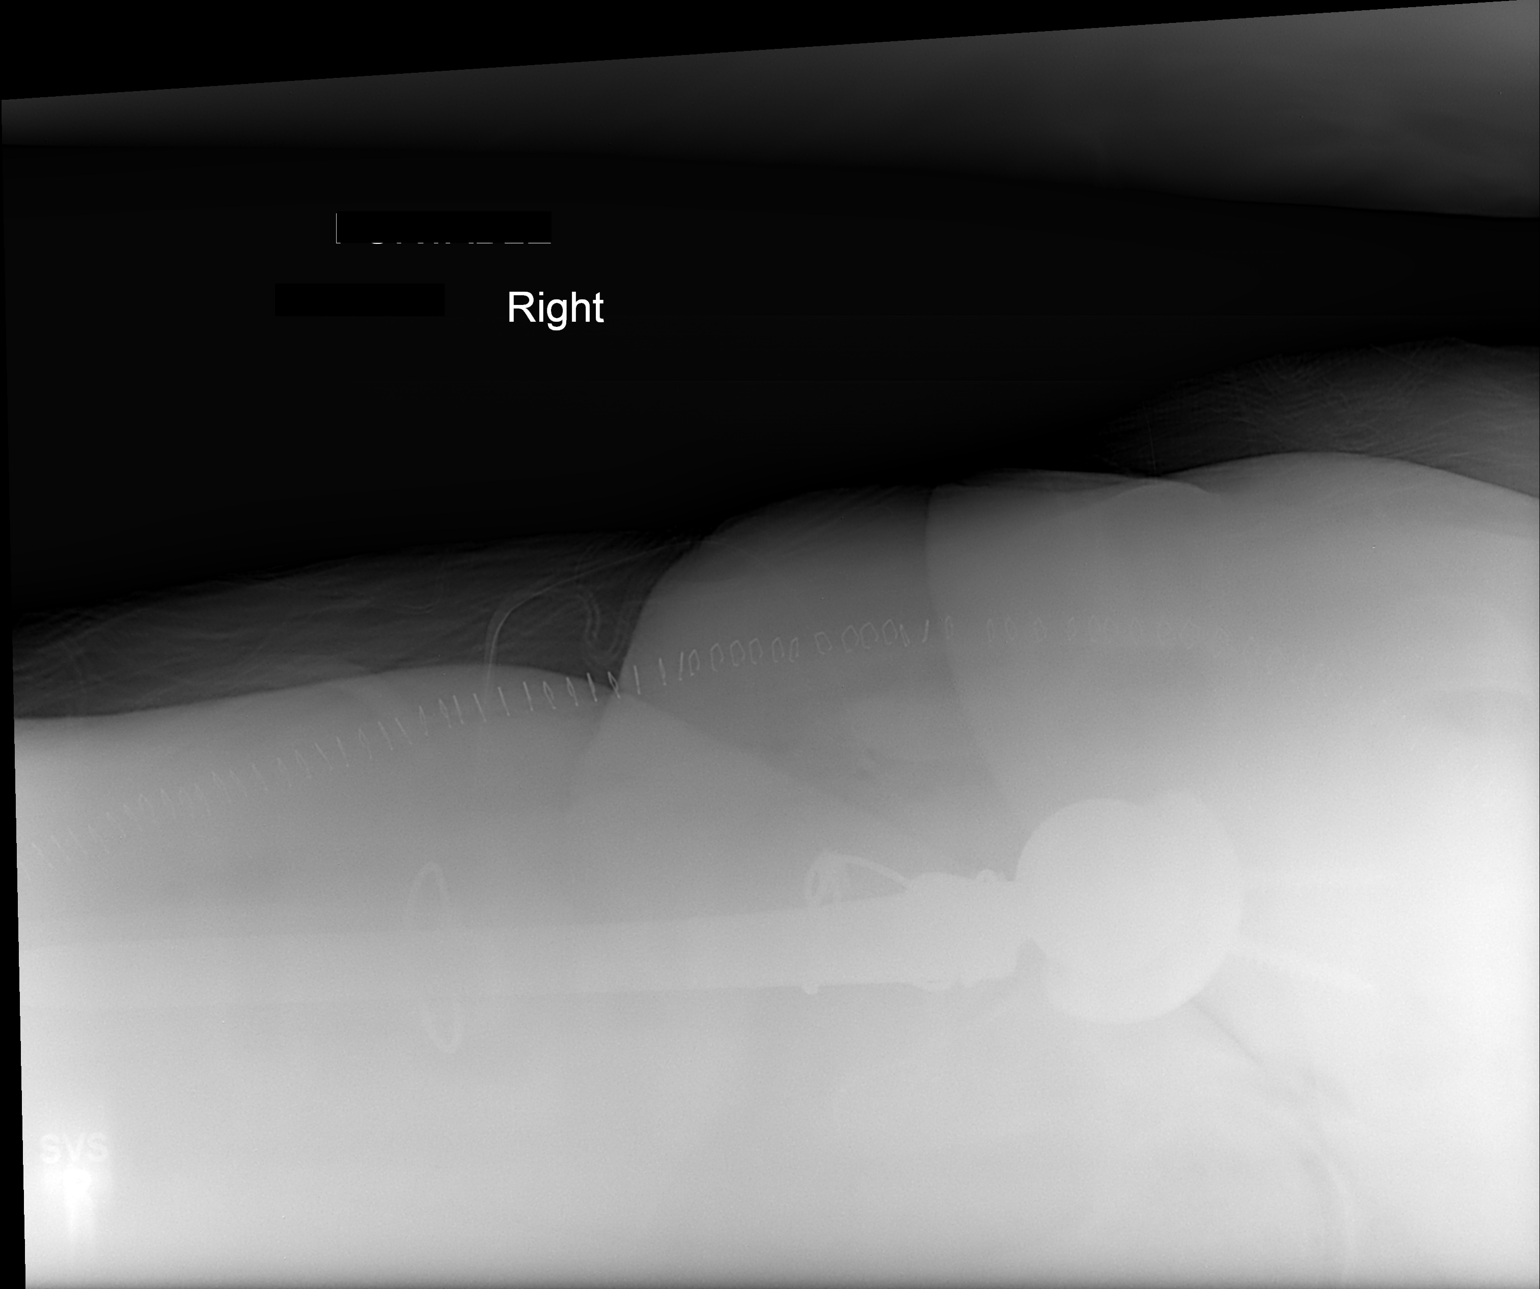

[Series 2: AP · right · 2 of 2 slices shown (2 of 2)]
[im 1/2]
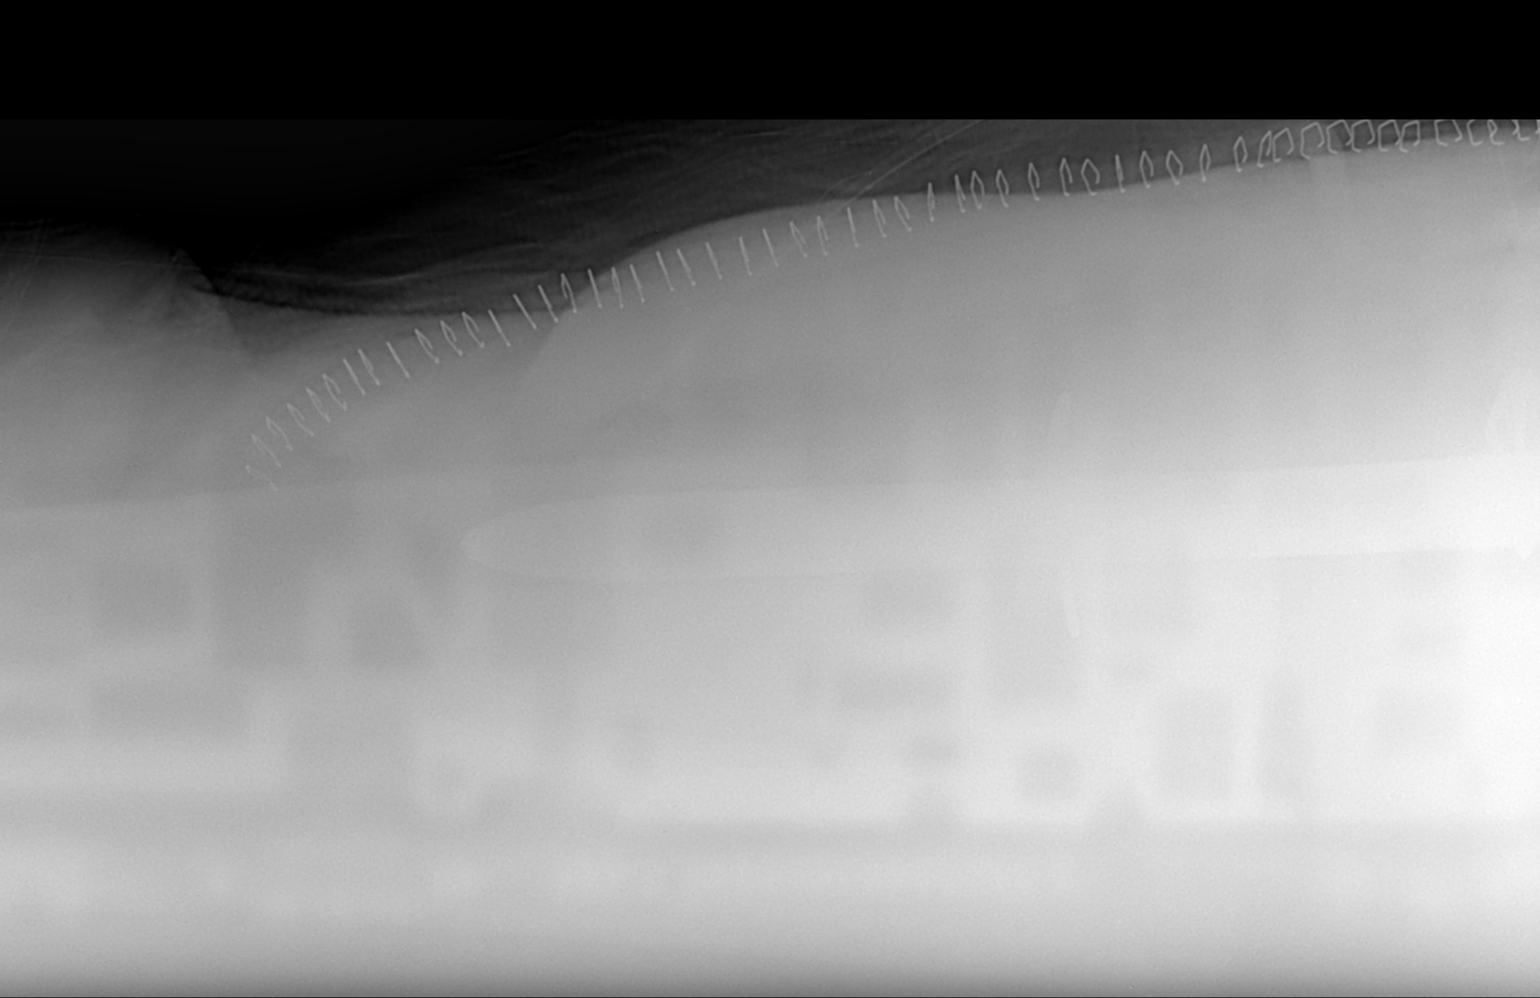
[im 2/2]
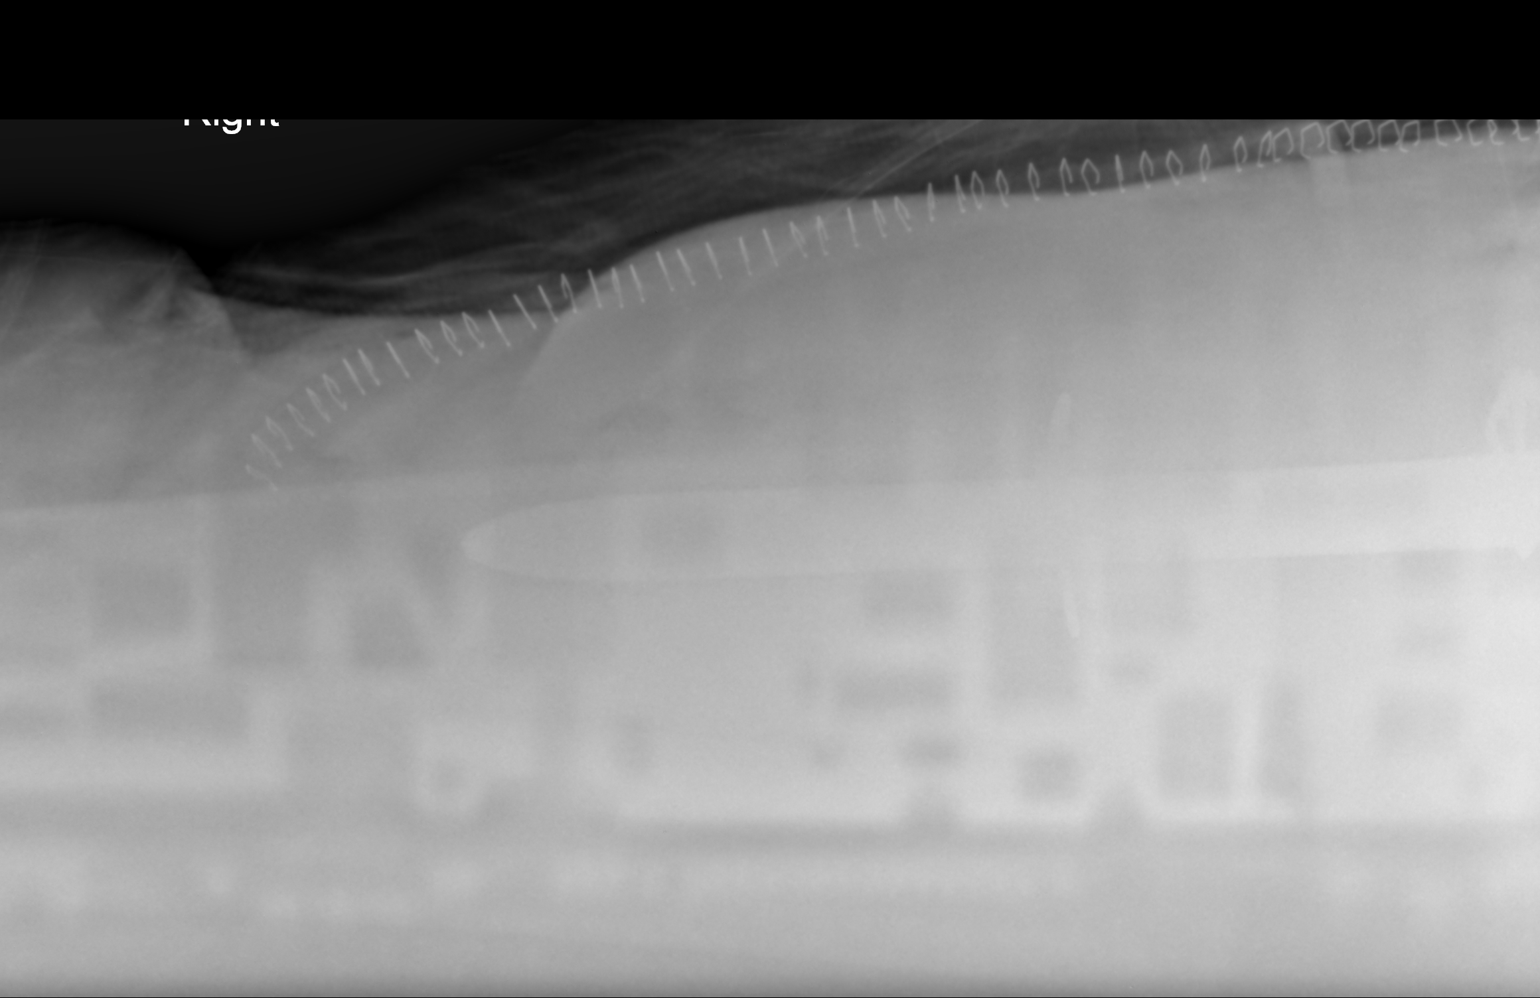

[3 of 3 positions shown; findings below may reference images not displayed]

FINDINGS: Lateral views of the right hip demonstrate right hip
arthroplasty with long stem femoral component and cerclage wires.
The distal portion of the femoral component is visualized but due
to underpenetration, limited detail is available.  Skin clips and
subcutaneous emphysema with surgical drains consistent with recent
surgery.
IMPRESSION: Postoperative changes with right hip arthroplasty demonstrated.

## 2012-03-25 IMAGING — CR DG PORTABLE PELVIS
1 series · 1 of 1 positions shown · non-contrast
Comparison: Bone survey [DATE].

CLINICAL DATA: Postop right hip surgery.

PORTABLE PELVIS

[AP]
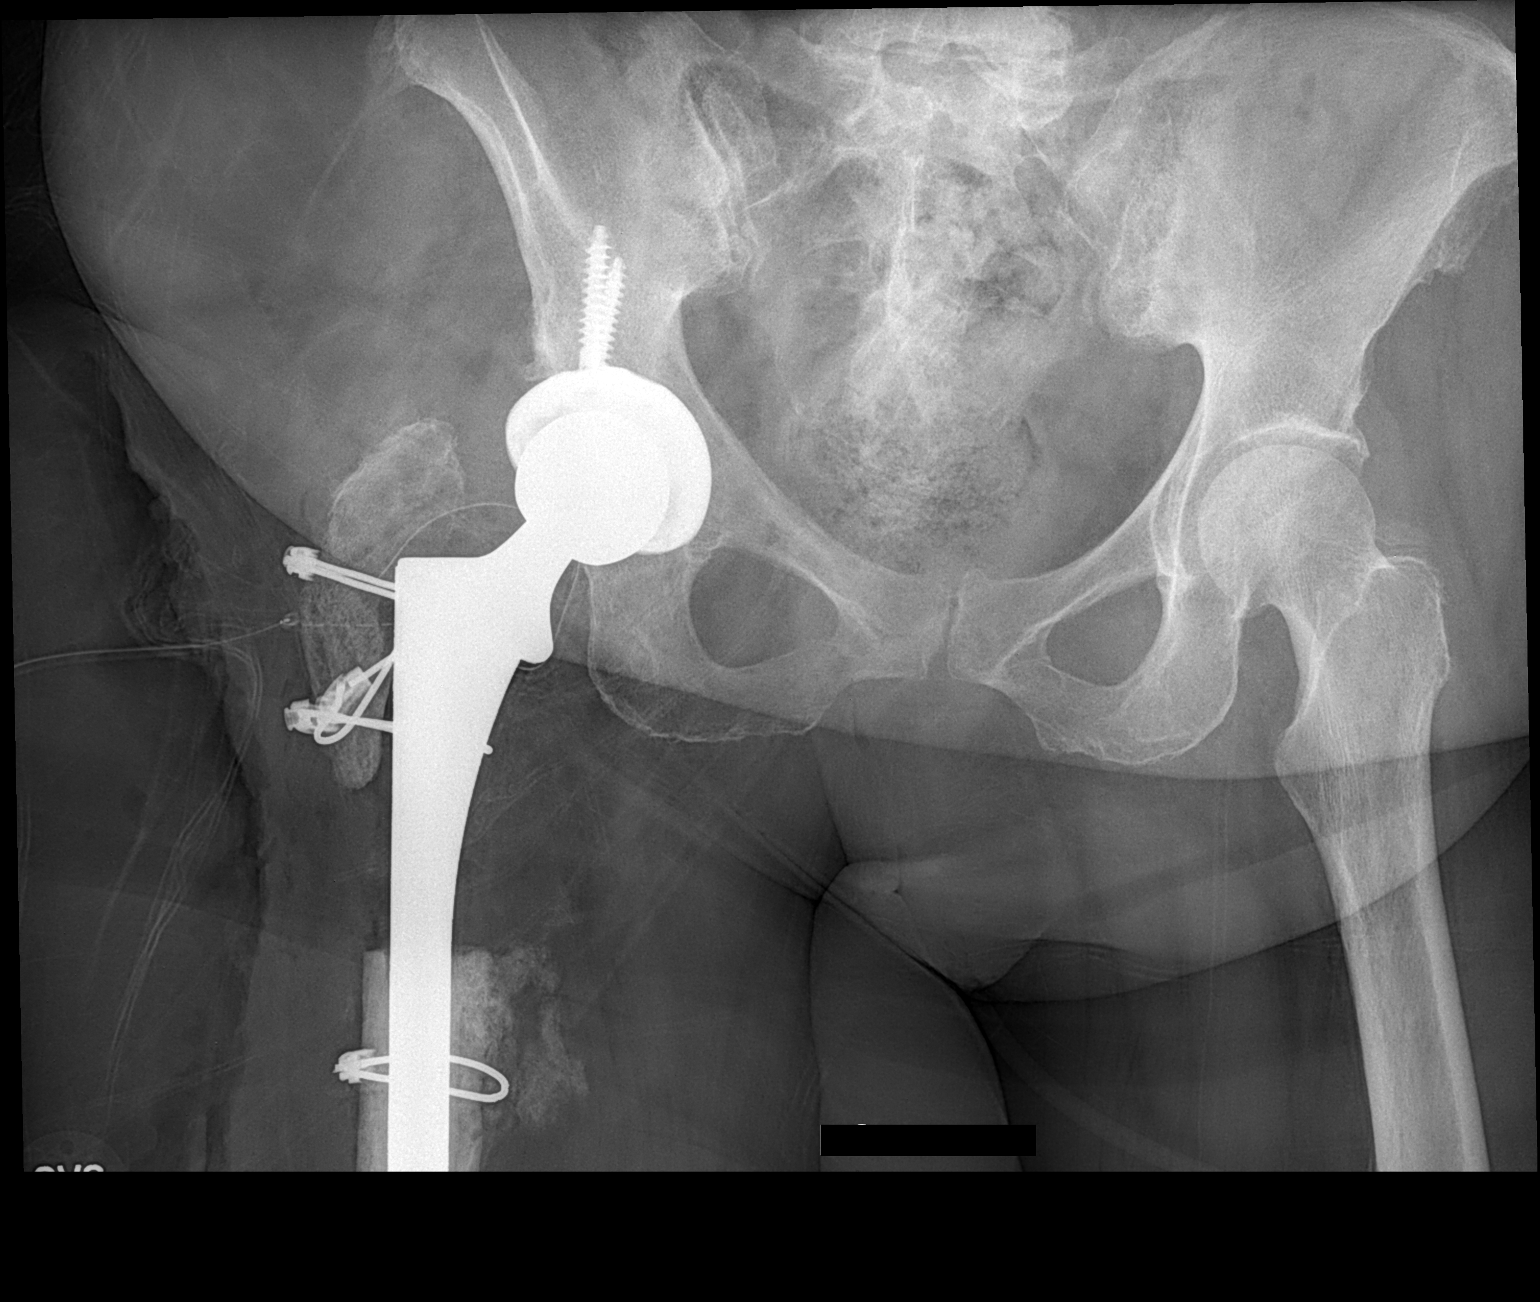

[1 of 1 positions shown; findings below may reference images not displayed]

FINDINGS: Interval placement of a right total hip arthroplasty with
long stem femoral component and cerclage wire fixation of greater
trochanteric fragments.  The femoral component bridges an osteotomy
site.  The distal tip of the femoral component is not included on
the image.  There is subcutaneous emphysema and surgical drains
consistent with recent surgery.  Degenerative changes in the left
hip.  No focal bone lesion appreciated.
IMPRESSION: Postoperative changes with right total hip arthroplasty.  No
apparent complication.  Incomplete visualization of the femoral
component.

## 2012-03-25 SURGERY — TOTAL HIP REVISION
Anesthesia: General | Site: Hip | Laterality: Right | Wound class: Clean

## 2012-03-25 MED ORDER — PROPOFOL 10 MG/ML IV BOLUS
INTRAVENOUS | Status: DC | PRN
  Administered 2012-03-25: 200 mg via INTRAVENOUS

## 2012-03-25 MED ORDER — ZOLPIDEM TARTRATE 5 MG PO TABS: 5.0000 mg | ORAL_TABLET | Freq: Every evening | ORAL | Status: DC | PRN

## 2012-03-25 MED ORDER — POLYETHYLENE GLYCOL 3350 17 G PO PACK
17.0000 g | PACK | Freq: Two times a day (BID) | ORAL | Status: DC
Start: 2012-03-26 — End: 2012-03-31
  Administered 2012-03-28 – 2012-03-30 (×4): 17 g via ORAL
  Filled 2012-03-25 (×11): qty 1

## 2012-03-25 MED ORDER — METHOCARBAMOL 100 MG/ML IJ SOLN
500.0000 mg | Freq: Four times a day (QID) | INTRAVENOUS | Status: DC | PRN
  Filled 2012-03-25 (×2): qty 5

## 2012-03-25 MED ORDER — METOCLOPRAMIDE HCL 10 MG PO TABS
5.0000 mg | ORAL_TABLET | Freq: Three times a day (TID) | ORAL | Status: DC | PRN
  Filled 2012-03-25: qty 1

## 2012-03-25 MED ORDER — HYDROMORPHONE HCL PF 1 MG/ML IJ SOLN
0.2500 mg | INTRAMUSCULAR | Status: DC | PRN
  Administered 2012-03-25: 0.5 mg via INTRAVENOUS
  Administered 2012-03-25: 0.25 mg via INTRAVENOUS

## 2012-03-25 MED ORDER — SERTRALINE HCL 50 MG PO TABS
50.0000 mg | ORAL_TABLET | Freq: Every morning | ORAL | Status: DC
  Administered 2012-03-26 – 2012-03-31 (×6): 50 mg via ORAL
  Filled 2012-03-25 (×6): qty 1

## 2012-03-25 MED ORDER — VANCOMYCIN HCL 1000 MG IV SOLR
1500.0000 mg | INTRAVENOUS | Status: DC | PRN
  Administered 2012-03-25: 1500 mg via INTRAVENOUS

## 2012-03-25 MED ORDER — PHENOL 1.4 % MT LIQD
1.0000 | OROMUCOSAL | Status: DC | PRN
  Filled 2012-03-25: qty 177

## 2012-03-25 MED ORDER — DOCUSATE SODIUM 100 MG PO CAPS
100.0000 mg | ORAL_CAPSULE | Freq: Two times a day (BID) | ORAL | Status: DC
  Administered 2012-03-26 – 2012-03-31 (×9): 100 mg via ORAL
  Filled 2012-03-25 (×11): qty 1

## 2012-03-25 MED ORDER — MEPERIDINE HCL 50 MG/ML IJ SOLN: 6.2500 mg | INTRAMUSCULAR | Status: DC | PRN

## 2012-03-25 MED ORDER — METOPROLOL TARTRATE 50 MG PO TABS
50.0000 mg | ORAL_TABLET | Freq: Two times a day (BID) | ORAL | Status: DC
  Filled 2012-03-25 (×2): qty 1

## 2012-03-25 MED ORDER — METOCLOPRAMIDE HCL 5 MG/ML IJ SOLN
5.0000 mg | Freq: Three times a day (TID) | INTRAMUSCULAR | Status: DC | PRN
  Filled 2012-03-25: qty 2

## 2012-03-25 MED ORDER — POTASSIUM CHLORIDE 2 MEQ/ML IV SOLN
100.0000 mL/h | INTRAVENOUS | Status: DC
  Administered 2012-03-26: 100 mL/h via INTRAVENOUS
  Filled 2012-03-25 (×4): qty 1000

## 2012-03-25 MED ORDER — SODIUM CHLORIDE 0.9 % IV SOLN
Freq: Once | INTRAVENOUS | Status: AC
  Administered 2012-03-25: via INTRAVENOUS

## 2012-03-25 MED ORDER — ALUM & MAG HYDROXIDE-SIMETH 200-200-20 MG/5ML PO SUSP
30.0000 mL | ORAL | Status: DC | PRN
  Filled 2012-03-25: qty 30

## 2012-03-25 MED ORDER — SODIUM CHLORIDE 0.9 % IV SOLN: INTRAVENOUS | Status: DC | PRN

## 2012-03-25 MED ORDER — PROMETHAZINE HCL 25 MG/ML IJ SOLN: 6.2500 mg | INTRAMUSCULAR | Status: DC | PRN

## 2012-03-25 MED ORDER — DIPHENHYDRAMINE HCL 25 MG PO CAPS: 25.0000 mg | ORAL_CAPSULE | Freq: Four times a day (QID) | ORAL | Status: DC | PRN

## 2012-03-25 MED ORDER — DIPHENHYDRAMINE HCL 25 MG PO TABS
25.0000 mg | ORAL_TABLET | Freq: Four times a day (QID) | ORAL | Status: DC | PRN
  Filled 2012-03-25: qty 1

## 2012-03-25 MED ORDER — PHENYLEPHRINE HCL 10 MG/ML IJ SOLN
10.0000 mg | INTRAVENOUS | Status: DC | PRN
  Administered 2012-03-25: 50 ug/min via INTRAVENOUS

## 2012-03-25 MED ORDER — HYDROCODONE-ACETAMINOPHEN 7.5-325 MG PO TABS
1.0000 | ORAL_TABLET | ORAL | Status: DC
  Administered 2012-03-26: 2 via ORAL
  Filled 2012-03-25: qty 1

## 2012-03-25 MED ORDER — SODIUM CHLORIDE 0.9 % IV SOLN
INTRAVENOUS | Status: DC | PRN
  Administered 2012-03-25: 18:00:00 via INTRAVENOUS

## 2012-03-25 MED ORDER — MENTHOL 3 MG MT LOZG
1.0000 | LOZENGE | OROMUCOSAL | Status: DC | PRN
  Filled 2012-03-25: qty 9

## 2012-03-25 MED ORDER — FLEET ENEMA 7-19 GM/118ML RE ENEM: 1.0000 | ENEMA | Freq: Once | RECTAL | Status: AC | PRN

## 2012-03-25 MED ORDER — FERROUS SULFATE 325 (65 FE) MG PO TABS
325.0000 mg | ORAL_TABLET | Freq: Three times a day (TID) | ORAL | Status: DC
  Administered 2012-03-26: 325 mg via ORAL
  Filled 2012-03-25 (×5): qty 1

## 2012-03-25 MED ORDER — MIDAZOLAM HCL 5 MG/5ML IJ SOLN
INTRAMUSCULAR | Status: DC | PRN
  Administered 2012-03-25: 1 mg via INTRAVENOUS

## 2012-03-25 MED ORDER — HYDROMORPHONE HCL PF 1 MG/ML IJ SOLN
0.5000 mg | INTRAMUSCULAR | Status: DC | PRN
  Administered 2012-03-26: 0.5 mg via INTRAVENOUS
  Filled 2012-03-25: qty 1

## 2012-03-25 MED ORDER — FENTANYL CITRATE 0.05 MG/ML IJ SOLN
INTRAMUSCULAR | Status: DC | PRN
  Administered 2012-03-25: 50 ug via INTRAVENOUS
  Administered 2012-03-25: 100 ug via INTRAVENOUS

## 2012-03-25 MED ORDER — SOTALOL HCL 80 MG PO TABS
80.0000 mg | ORAL_TABLET | ORAL | Status: DC
  Filled 2012-03-25: qty 1

## 2012-03-25 MED ORDER — VANCOMYCIN HCL IN DEXTROSE 1-5 GM/200ML-% IV SOLN
1000.0000 mg | Freq: Two times a day (BID) | INTRAVENOUS | Status: AC
  Administered 2012-03-26: 1000 mg via INTRAVENOUS
  Filled 2012-03-25: qty 200

## 2012-03-25 MED ORDER — STERILE WATER FOR IRRIGATION IR SOLN
Status: DC | PRN
  Administered 2012-03-25: 3000 mL

## 2012-03-25 MED ORDER — CHLORHEXIDINE GLUCONATE 4 % EX LIQD: 60.0000 mL | Freq: Once | CUTANEOUS | Status: DC

## 2012-03-25 MED ORDER — LACTATED RINGERS IV SOLN: INTRAVENOUS | Status: DC

## 2012-03-25 MED ORDER — DILTIAZEM HCL ER COATED BEADS 240 MG PO CP24
240.0000 mg | ORAL_CAPSULE | Freq: Every day | ORAL | Status: DC
  Filled 2012-03-25 (×2): qty 1

## 2012-03-25 MED ORDER — ONDANSETRON HCL 4 MG PO TABS: 4.0000 mg | ORAL_TABLET | Freq: Four times a day (QID) | ORAL | Status: DC | PRN

## 2012-03-25 MED ORDER — CEFAZOLIN SODIUM-DEXTROSE 2-3 GM-% IV SOLR: 2.0000 g | INTRAVENOUS | Status: DC

## 2012-03-25 MED ORDER — CISATRACURIUM BESYLATE (PF) 10 MG/5ML IV SOLN
INTRAVENOUS | Status: DC | PRN
  Administered 2012-03-25: 10 mg via INTRAVENOUS

## 2012-03-25 MED ORDER — METHOCARBAMOL 500 MG PO TABS
500.0000 mg | ORAL_TABLET | Freq: Four times a day (QID) | ORAL | Status: DC | PRN
  Administered 2012-03-26 – 2012-03-29 (×2): 500 mg via ORAL
  Filled 2012-03-25 (×3): qty 1

## 2012-03-25 MED ORDER — PRO-STAT SUGAR FREE PO LIQD
30.0000 mL | Freq: Two times a day (BID) | ORAL | Status: DC
  Administered 2012-03-27 – 2012-03-31 (×7): 30 mL via ORAL
  Filled 2012-03-25 (×12): qty 30

## 2012-03-25 MED ORDER — 0.9 % SODIUM CHLORIDE (POUR BTL) OPTIME
TOPICAL | Status: DC | PRN
  Administered 2012-03-25: 1000 mL

## 2012-03-25 MED ORDER — BISACODYL 10 MG RE SUPP: 10.0000 mg | Freq: Every day | RECTAL | Status: DC | PRN

## 2012-03-25 MED ORDER — ONDANSETRON HCL 4 MG/2ML IJ SOLN
4.0000 mg | Freq: Four times a day (QID) | INTRAMUSCULAR | Status: DC | PRN
  Filled 2012-03-25: qty 2

## 2012-03-25 SURGICAL SUPPLY — 64 items
ARTICULEZE HEAD (Hips) ×2 IMPLANT
BAG ZIPLOCK 12X15 (MISCELLANEOUS) ×2 IMPLANT
BLADE SAW SGTL 18X1.27X75 (BLADE) ×2 IMPLANT
BRUSH FEMORAL CANAL (MISCELLANEOUS) ×2 IMPLANT
CABLE (Orthopedic Implant) ×8 IMPLANT
CLOTH BEACON ORANGE TIMEOUT ST (SAFETY) ×2 IMPLANT
CUP ACETBLR 52 OD PINNACLE (Hips) ×2 IMPLANT
DERMABOND ADVANCED (GAUZE/BANDAGES/DRESSINGS) ×1
DERMABOND ADVANCED .7 DNX12 (GAUZE/BANDAGES/DRESSINGS) ×1 IMPLANT
DRAPE INCISE IOBAN 85X60 (DRAPES) ×4 IMPLANT
DRAPE ORTHO SPLIT 77X108 STRL (DRAPES) ×2
DRAPE POUCH INSTRU U-SHP 10X18 (DRAPES) ×2 IMPLANT
DRAPE SURG 17X11 SM STRL (DRAPES) ×2 IMPLANT
DRAPE SURG ORHT 6 SPLT 77X108 (DRAPES) ×2 IMPLANT
DRAPE U-SHAPE 47X51 STRL (DRAPES) ×2 IMPLANT
DRSG AQUACEL AG ADV 3.5X10 (GAUZE/BANDAGES/DRESSINGS) IMPLANT
DRSG AQUACEL AG ADV 3.5X14 (GAUZE/BANDAGES/DRESSINGS) ×2 IMPLANT
DRSG EMULSION OIL 3X16 NADH (GAUZE/BANDAGES/DRESSINGS) ×2 IMPLANT
DRSG MEPILEX BORDER 4X12 (GAUZE/BANDAGES/DRESSINGS) ×4 IMPLANT
DRSG MEPILEX BORDER 4X4 (GAUZE/BANDAGES/DRESSINGS) IMPLANT
DRSG MEPILEX BORDER 4X8 (GAUZE/BANDAGES/DRESSINGS) IMPLANT
DRSG TEGADERM 4X4.75 (GAUZE/BANDAGES/DRESSINGS) ×2 IMPLANT
DURAPREP 26ML APPLICATOR (WOUND CARE) ×2 IMPLANT
ELECT BLADE TIP CTD 4 INCH (ELECTRODE) ×4 IMPLANT
ELECT REM PT RETURN 9FT ADLT (ELECTROSURGICAL) ×2
ELECTRODE REM PT RTRN 9FT ADLT (ELECTROSURGICAL) ×1 IMPLANT
ELIMINATOR HOLE APEX DEPUY (Hips) ×2 IMPLANT
EVACUATOR 1/8 PVC DRAIN (DRAIN) IMPLANT
FACESHIELD LNG OPTICON STERILE (SAFETY) ×10 IMPLANT
GAUZE SPONGE 2X2 8PLY STRL LF (GAUZE/BANDAGES/DRESSINGS) ×1 IMPLANT
GLOVE BIOGEL PI IND STRL 7.5 (GLOVE) ×2 IMPLANT
GLOVE BIOGEL PI IND STRL 8 (GLOVE) ×2 IMPLANT
GLOVE BIOGEL PI INDICATOR 7.5 (GLOVE) ×2
GLOVE BIOGEL PI INDICATOR 8 (GLOVE) ×2
GLOVE ECLIPSE 8.0 STRL XLNG CF (GLOVE) ×4 IMPLANT
GLOVE SURG ORTHO 8.0 STRL STRW (GLOVE) ×2 IMPLANT
GOWN BRE IMP PREV XXLGXLNG (GOWN DISPOSABLE) ×2 IMPLANT
GOWN STRL NON-REIN LRG LVL3 (GOWN DISPOSABLE) ×8 IMPLANT
HANDPIECE INTERPULSE COAX TIP (DISPOSABLE) ×1
HEAD ARTICULEZE (Hips) ×1 IMPLANT
KIT BASIN OR (CUSTOM PROCEDURE TRAY) ×2 IMPLANT
LINER NEUTRAL 52X36MM PLUS 4 (Liner) ×2 IMPLANT
MANIFOLD NEPTUNE II (INSTRUMENTS) ×2 IMPLANT
NS IRRIG 1000ML POUR BTL (IV SOLUTION) ×2 IMPLANT
PACK TOTAL JOINT (CUSTOM PROCEDURE TRAY) ×2 IMPLANT
PENCIL BUTTON HOLSTER BLD 10FT (ELECTRODE) ×2 IMPLANT
POSITIONER SURGICAL ARM (MISCELLANEOUS) ×2 IMPLANT
PRESSURIZER FEMORAL UNIV (MISCELLANEOUS) ×2 IMPLANT
SCREW 6.5MMX35MM (Screw) ×4 IMPLANT
SET HNDPC FAN SPRY TIP SCT (DISPOSABLE) ×1 IMPLANT
SPONGE GAUZE 2X2 STER 10/PKG (GAUZE/BANDAGES/DRESSINGS) ×1
SPONGE LAP 18X18 X RAY DECT (DISPOSABLE) ×2 IMPLANT
SPONGE LAP 4X18 X RAY DECT (DISPOSABLE) IMPLANT
STAPLER VISISTAT 35W (STAPLE) IMPLANT
STEM STR LG STATURE 8IN 15MM (Stem) ×2 IMPLANT
SUCTION FRAZIER TIP 10 FR DISP (SUCTIONS) ×2 IMPLANT
SUT VIC AB 1 CT1 36 (SUTURE) ×8 IMPLANT
SUT VIC AB 2-0 CT1 27 (SUTURE) ×3
SUT VIC AB 2-0 CT1 TAPERPNT 27 (SUTURE) ×3 IMPLANT
SUT VLOC 180 0 24IN GS25 (SUTURE) IMPLANT
TOWEL OR 17X26 10 PK STRL BLUE (TOWEL DISPOSABLE) ×6 IMPLANT
TOWER CARTRIDGE SMART MIX (DISPOSABLE) ×2 IMPLANT
TRAY FOLEY CATH 14FRSI W/METER (CATHETERS) ×2 IMPLANT
WATER STERILE IRR 1500ML POUR (IV SOLUTION) ×4 IMPLANT

## 2012-03-25 NOTE — Transfer of Care (Signed)
Immediate Anesthesia Transfer of Care Note  Patient: [name]  Procedure(s) Performed: Procedure(s) (LRB) with comments: TOTAL HIP REVISION (Right) - Right Total Hip Reimplantation  Patient Location: PACU  Anesthesia Type: General  Level of Consciousness: awake, alert  and patient cooperative  Airway & Oxygen Therapy: Patient Spontanous Breathing and Patient connected to face mask oxygen  Post-op Assessment: Report given to PACU RN and Post -op Vital signs reviewed and stable  Post vital signs: Reviewed and stable  Complications: No apparent anesthesia complications

## 2012-03-25 NOTE — Interval H&P Note (Signed)
History and Physical Interval Note:  03/25/2012 5:24 PM  [name]  has presented today for surgery, with the diagnosis of failed right total hip resection  The various methods of treatment have been discussed with the patient and family. After consideration of risks, benefits and other options for treatment, the patient has consented to  Procedure(s) (LRB) with comments: TOTAL HIP REVISION (Right) - Right Total Hip Reimplantation as a surgical intervention .  The patient's history has been reviewed, patient examined, no change in status, stable for surgery.  I have reviewed the patient's chart and labs.  Questions were answered to the patient's satisfaction.     [name]

## 2012-03-25 NOTE — Progress Notes (Signed)
Informed pt, her husband, and two daughters that Dr Alvan Dame is running behind schedule and pt will be about 45 minutes late going to holding area.They verbalize understanding.

## 2012-03-25 NOTE — Brief Op Note (Signed)
03/25/2012  10:44 PM  PATIENT:  [name]  66 y.o. female  PRE-OPERATIVE DIAGNOSIS:  Status post right total hip resection for infection  POST-OPERATIVE DIAGNOSIS:  Status post right total hip resection for infection  PROCEDURE:  Procedure(s) (LRB) with comments: TOTAL HIP REVISION (Right) - Right Total Hip Reimplantation  SURGEON:  Surgeon(s) and Role:    * [name], MD - Primary  PHYSICIAN ASSISTANT: Danae Orleans, PA-C   ANESTHESIA:   general  EBL:  Total I/O In: 850 [I.V.:600; IV Piggyback:250] Out: 850 [Blood:850]  BLOOD ADMINISTERED:none  DRAINS: (1 medium) Hemovact drain(s) in the right hip with  Suction Open   LOCAL MEDICATIONS USED:  NONE  SPECIMEN:  No Specimen  DISPOSITION OF SPECIMEN:  N/A  COUNTS:  YES  TOURNIQUET:  * No tourniquets in log *  DICTATION: .Other Dictation: Dictation Number [id number]  PLAN OF CARE: Admit to inpatient   PATIENT DISPOSITION:  PACU - hemodynamically stable.   Delay start of Pharmacological VTE agent (>24hrs) due to surgical blood loss or risk of bleeding: no

## 2012-03-25 NOTE — Anesthesia Postprocedure Evaluation (Signed)
  Anesthesia Post-op Note  Patient: [name]  Procedure(s) Performed: Procedure(s) (LRB) with comments: TOTAL HIP REVISION (Right) - Right Total Hip Reimplantation  Patient Location: PACU  Anesthesia Type: General  Level of Consciousness: awake, alert , oriented and patient cooperative  Airway and Oxygen Therapy: Patient Spontanous Breathing and Patient connected to nasal cannula oxygen  Post-op Pain: moderate  Post-op Assessment: Post-op Vital signs reviewed, Patient's Cardiovascular Status Stable, Respiratory Function Stable, Patent Airway, No signs of Nausea or vomiting and Pain level controlled  Post-op Vital Signs: Reviewed and stable  Complications: No apparent anesthesia complications

## 2012-03-25 NOTE — Anesthesia Preprocedure Evaluation (Signed)
Anesthesia Evaluation  Patient identified by MRN, date of birth, ID band Patient awake    Reviewed: Allergy & Precautions, H&P , NPO status , Patient's Chart, lab work & pertinent test results, reviewed documented beta blocker date and time   Airway Mallampati: II TM Distance: >3 FB Neck ROM: full    Dental No notable dental hx. (+) Teeth Intact and Dental Advisory Given   Pulmonary asthma , sleep apnea ,  breath sounds clear to auscultation  Pulmonary exam normal       Cardiovascular hypertension, Pt. on medications and Pt. on home beta blockers +CHF + dysrhythmias Atrial Fibrillation Rhythm:Regular Rate:Normal     Neuro/Psych Depression CVA    GI/Hepatic   Endo/Other  Morbid obesityobese  Renal/GU ESRF and DialysisRenal disease (shunt L arm. patent)     Musculoskeletal  (+) Arthritis -,   Abdominal   Peds  Hematology   Anesthesia Other Findings   Reproductive/Obstetrics                           Anesthesia Physical  Anesthesia Plan  ASA: IV  Anesthesia Plan: General   Post-op Pain Management:    Induction: Intravenous  Airway Management Planned: Oral ETT  Additional Equipment:   Intra-op Plan:   Post-operative Plan: Extubation in OR  Informed Consent: I have reviewed the patients History and Physical, chart, labs and discussed the procedure including the risks, benefits and alternatives for the proposed anesthesia with the patient or authorized representative who has indicated his/her understanding and acceptance.   Dental advisory given  Plan Discussed with: CRNA, Surgeon and Anesthesiologist  Anesthesia Plan Comments:         Anesthesia Quick Evaluation

## 2012-03-26 ENCOUNTER — Encounter (HOSPITAL_COMMUNITY): Payer: Self-pay | Admitting: Orthopedic Surgery

## 2012-03-26 DIAGNOSIS — D500 Iron deficiency anemia secondary to blood loss (chronic): Secondary | ICD-10-CM

## 2012-03-26 LAB — CBC
HCT: 30.6 % — ABNORMAL LOW (ref 36.0–46.0)
Hemoglobin: 9.7 g/dL — ABNORMAL LOW (ref 12.0–15.0)
MCH: 25.7 pg — ABNORMAL LOW (ref 26.0–34.0)
MCHC: 31.7 g/dL (ref 30.0–36.0)
MCV: 81.2 fL (ref 78.0–100.0)
Platelets: 229 10*3/uL (ref 150–400)
RBC: 3.77 MIL/uL — ABNORMAL LOW (ref 3.87–5.11)
RDW: 17.7 % — ABNORMAL HIGH (ref 11.5–15.5)
WBC: 16.3 10*3/uL — ABNORMAL HIGH (ref 4.0–10.5)

## 2012-03-26 LAB — COMPREHENSIVE METABOLIC PANEL
ALT: 15 U/L (ref 0–35)
AST: 21 U/L (ref 0–37)
Albumin: 2.6 g/dL — ABNORMAL LOW (ref 3.5–5.2)
Alkaline Phosphatase: 95 U/L (ref 39–117)
BUN: 18 mg/dL (ref 6–23)
CO2: 26 mEq/L (ref 19–32)
Calcium: 8.4 mg/dL (ref 8.4–10.5)
Chloride: 105 mEq/L (ref 96–112)
Creatinine, Ser: 2.7 mg/dL — ABNORMAL HIGH (ref 0.50–1.10)
GFR calc Af Amer: 20 mL/min — ABNORMAL LOW (ref >90)
GFR calc non Af Amer: 18 mL/min — ABNORMAL LOW (ref >90)
Glucose, Bld: 143 mg/dL — ABNORMAL HIGH (ref 70–99)
Potassium: 3.6 mEq/L (ref 3.5–5.1)
Sodium: 139 mEq/L (ref 135–145)
Total Bilirubin: 0.4 mg/dL (ref 0.3–1.2)
Total Protein: 5.2 g/dL — ABNORMAL LOW (ref 6.0–8.3)

## 2012-03-26 LAB — CK TOTAL AND CKMB (NOT AT ARMC)
CK, MB: 2.8 ng/mL (ref 0.3–4.0)
Relative Index: 0.7 (ref 0.0–2.5)
Total CK: 374 U/L — ABNORMAL HIGH (ref 7–177)

## 2012-03-26 LAB — TROPONIN I: Troponin I: <0.3 ng/mL (ref <0.30)

## 2012-03-26 LAB — LACTIC ACID, PLASMA: Lactic Acid, Venous: 0.9 mmol/L (ref 0.5–2.2)

## 2012-03-26 LAB — PROCALCITONIN: Procalcitonin: 0.22 ng/mL

## 2012-03-26 LAB — MRSA PCR SCREENING: MRSA by PCR: NEGATIVE

## 2012-03-26 MED ORDER — LIDOCAINE-PRILOCAINE 2.5-2.5 % EX CREA
1.0000 "application " | TOPICAL_CREAM | CUTANEOUS | Status: DC | PRN
  Filled 2012-03-26: qty 5

## 2012-03-26 MED ORDER — NEPRO/CARBSTEADY PO LIQD
237.0000 mL | ORAL | Status: DC | PRN
  Filled 2012-03-26: qty 237

## 2012-03-26 MED ORDER — SODIUM CHLORIDE 0.9 % IV SOLN
Freq: Once | INTRAVENOUS | Status: AC
  Administered 2012-03-26: 250 mL/h via INTRAVENOUS

## 2012-03-26 MED ORDER — SODIUM CHLORIDE 0.9 % IV BOLUS (SEPSIS)
250.0000 mL | Freq: Once | INTRAVENOUS | Status: AC
  Administered 2012-03-26: 250 mL via INTRAVENOUS

## 2012-03-26 MED ORDER — SODIUM CHLORIDE 0.9 % IV SOLN: 100.0000 mL | INTRAVENOUS | Status: DC | PRN

## 2012-03-26 MED ORDER — HEPARIN SODIUM (PORCINE) 1000 UNIT/ML DIALYSIS
1000.0000 [IU] | INTRAMUSCULAR | Status: DC | PRN
  Filled 2012-03-26: qty 1

## 2012-03-26 MED ORDER — ALTEPLASE 2 MG IJ SOLR
2.0000 mg | Freq: Once | INTRAMUSCULAR | Status: AC | PRN
  Filled 2012-03-26: qty 2

## 2012-03-26 MED ORDER — DARBEPOETIN ALFA-POLYSORBATE 25 MCG/0.42ML IJ SOLN
12.5000 ug | INTRAMUSCULAR | Status: DC
  Administered 2012-03-27: 10:00:00 via INTRAVENOUS
  Filled 2012-03-26: qty 0.42

## 2012-03-26 MED ORDER — NYSTATIN 100000 UNIT/GM EX POWD
Freq: Two times a day (BID) | CUTANEOUS | Status: DC
  Administered 2012-03-26 – 2012-03-31 (×7): via TOPICAL
  Filled 2012-03-26: qty 15

## 2012-03-26 MED ORDER — SODIUM CHLORIDE 0.9 % IV SOLN
125.0000 mg | INTRAVENOUS | Status: DC
  Administered 2012-03-27: 125 mg via INTRAVENOUS
  Filled 2012-03-26 (×2): qty 10

## 2012-03-26 MED ORDER — FENTANYL CITRATE 0.05 MG/ML IJ SOLN
12.5000 ug | INTRAMUSCULAR | Status: DC | PRN
  Administered 2012-03-26: 12.5 ug via INTRAVENOUS
  Administered 2012-03-26: 25 ug via INTRAVENOUS
  Administered 2012-03-26: 12.5 ug via INTRAVENOUS
  Administered 2012-03-26 – 2012-03-27 (×10): 25 ug via INTRAVENOUS
  Filled 2012-03-26 (×12): qty 2

## 2012-03-26 MED ORDER — CALCIUM ACETATE 667 MG PO CAPS
667.0000 mg | ORAL_CAPSULE | Freq: Three times a day (TID) | ORAL | Status: DC
  Administered 2012-03-26 – 2012-03-30 (×10): 667 mg via ORAL
  Filled 2012-03-26 (×18): qty 1

## 2012-03-26 MED ORDER — MUPIROCIN 2 % EX OINT
1.0000 "application " | TOPICAL_OINTMENT | Freq: Two times a day (BID) | CUTANEOUS | Status: AC
  Administered 2012-03-26 – 2012-03-31 (×10): 1 via NASAL
  Filled 2012-03-26 (×2): qty 22

## 2012-03-26 MED ORDER — PENTAFLUOROPROP-TETRAFLUOROETH EX AERO: 1.0000 "application " | INHALATION_SPRAY | CUTANEOUS | Status: DC | PRN

## 2012-03-26 MED ORDER — CHLORHEXIDINE GLUCONATE CLOTH 2 % EX PADS
6.0000 | MEDICATED_PAD | Freq: Every day | CUTANEOUS | Status: AC
  Administered 2012-03-27 – 2012-03-31 (×5): 6 via TOPICAL

## 2012-03-26 MED ORDER — LIDOCAINE HCL (PF) 1 % IJ SOLN: 5.0000 mL | INTRAMUSCULAR | Status: DC | PRN

## 2012-03-26 MED ORDER — SODIUM CHLORIDE 0.9 % IV BOLUS (SEPSIS): 500.0000 mL | Freq: Once | INTRAVENOUS | Status: DC

## 2012-03-26 MED ORDER — INFLUENZA VIRUS VACC SPLIT PF IM SUSP
0.5000 mL | INTRAMUSCULAR | Status: AC
  Administered 2012-03-27: 0.5 mL via INTRAMUSCULAR
  Filled 2012-03-26: qty 0.5

## 2012-03-26 NOTE — Progress Notes (Signed)
Clinical Social Work Department BRIEF PSYCHOSOCIAL ASSESSMENT 03/26/2012  Patient:  BUNNY, HORT     Account Number:  [id number]     Admit date:  03/25/2012  Clinical Social Worker:  Lacie Scotts  Date/Time:  03/26/2012 10:57 AM  Referred by:  Physician  Date Referred:  03/26/2012 Referred for  SNF Placement   Other Referral:   Interview type:  Patient Other interview type:    PSYCHOSOCIAL DATA Living Status:  HUSBAND Admitted from facility:   Level of care:   Primary support name:  Harrold Donath Primary support relationship to patient:  SPOUSE Degree of support available:  Supportive  CURRENT CONCERNS Current Concerns  Post-Acute Placement   Other Concerns:    SOCIAL WORK ASSESSMENT / PLAN Pt is a 66 yr old female living at home prior to hospitalization. CSW met with pt /spouse just prior to pt's trans to [facility]. ST SNF placement is being requested by pt. She has been to [facility] in past for rehab. SNF contacted and [facility] is investigating insurance coverage. Pt is also interested in [facility]. CSW wil follow to assist with d/c planning to SNF.   Assessment/plan status:  Psychosocial Support/Ongoing Assessment of Needs Other assessment/ plan:   Information/referral to community resources:   None provided at this time.    PATIENT'S/FAMILY'S RESPONSE TO PLAN OF CARE: Pt is interested in ST rehab following hospital d/c. She is concerned about insurance coveraging her rehab. since she has had a prior placement at [facility].    Werner Lean LCSW [phone]

## 2012-03-26 NOTE — Anesthesia Postprocedure Evaluation (Signed)
  Anesthesia Post-op Note  Patient: [name]  Procedure(s) Performed: Procedure(s) (LRB): TOTAL HIP REVISION (Right)  Patient Location: PACU  Anesthesia Type: General  Level of Consciousness: awake and alert   Airway and Oxygen Therapy: Patient Spontanous Breathing  Post-op Pain: mild  Post-op Assessment: Post-op Vital signs reviewed, Patient's Cardiovascular Status Stable, Respiratory Function Stable, Patent Airway and No signs of Nausea or vomiting  Post-op Vital Signs: stable  Complications: No apparent anesthesia complications

## 2012-03-26 NOTE — Care Management Note (Addendum)
  Page 2 of 2   03/26/2012     11:23:13 AM   CARE MANAGEMENT NOTE 03/26/2012  Patient:  [name], [name]   Account Number:  [id number]  Date Initiated:  03/26/2012  Documentation initiated by:  Sherrin Daisy  Subjective/Objective Assessment:   dx s/p right hip resection infection; rt total hip re-implanatation     Action/Plan:   Wants SNF rehab   Anticipated DC Date:  03/26/2012   Anticipated DC Plan:  SKILLED NURSING FACILITY  In-house referral  Clinical Social Worker      DC Planning Services  Case managemnt      [facility] Choice  NA   Choice offered to / List presented to:  NA   DME arranged  NA      DME agency  NA     [place] arranged  NA      [place] agency  NA   Status of service:  Completed, signed off Medicare Important Message given?   (If response is "NO", the following Medicare IM given date fields will be blank) Date Medicare IM given:   Date Additional Medicare IM given:    Discharge Disposition:  Skilled nsg facility  Per UR Regulation:    If discussed at Long Length of Stay Meetings, dates discussed:   9/11 11:26a debbie Maritta Kief rn,bsn  Pt trasnf to [facility] stepdown unit. Will make sw ref.  Comments:  03/26/2012 [place] BSN CCM [phone] Pt being transferred to [facility] via ambulance.

## 2012-03-26 NOTE — Consult Note (Signed)
The patient was transferred from [facility] to [facility] today in order to facilitate scheduled dialysis treatments.  I have seen the patient for a followup consultation visit.  I have alerted the nephrology team to assure they are aware of her presence.  It should be noted that this patient has a well-documented history of persistent/chronic hypotension.  At the time of my evaluation the patient's systolic blood pressure was confirmed to be 100.  She was awake alert conversant and oriented.  She had no specific complaints with exception to pain at her recent surgical site.  Cherene Altes, MD [facility] Office  [phone] Pager [phone]  On-Call/Text Page:      [email or web address]      password [facility]

## 2012-03-26 NOTE — Progress Notes (Signed)
Pt BP had been running low all night. Called [facility] to see if wanted to do anything. Dr. Doyle Askew from [facility] answered my page. Got an order for a NS bolus of 250 mL. Dr. Doyle Askew said as long as pt remains asymptomatic just watch the BP after the bolus. Bolus infusing now with pt asymptomatic. Will continue to monitor.

## 2012-03-26 NOTE — Progress Notes (Signed)
Pt arrived on the floor at 0010 from PACU. BP 60/30 manual. Paged PA on call. Babish called back and said to give 500 NS bolus. He called the hospitalist to consult since patient is on dialysis patient. Dr Hal Hope assessed pt and put in order to transfer to stepdown.

## 2012-03-26 NOTE — Progress Notes (Signed)
Rapid Response Nurse: Arrived to 1621 after being called for hypotension and transfer order for stepdown. Upon arrival pt's BP stable. IVF bolus 500cc being given when I arrived. Continued to monitor pt and VS. Dilaudid 0.5mg  IV given for pain per prn order. BP remained stable after pain med and IVF bolus completed. MD called and upated- order received to cancel transfer.

## 2012-03-26 NOTE — Op Note (Signed)
NAMEJHOVANA, NELL                 ACCOUNT NO.:  [id number]  MEDICAL RECORD NO.:  [id number]  LOCATION:  2924                         FACILITY:  [facility]  PHYSICIAN:  Pietro Cassis. Alvan Dame, M.D.  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  PROCEDURE:  Reimplantation/revision right total hip replacement.  COMPONENTS USED:  Size 52 Pinnacle cup, 2 cancellous screws, a 36 +4 neutral AltrX liner, a size 15 large stature, 8 inch solution fully porous-coated stem with a 36 +5 metal ball.  SURGEON:  Pietro Cassis. Alvan Dame, M.D.  ASSISTANT:  Danae Orleans, PA.  Note that Mr. Guinevere Scarlet was present for the entirety of the case, critical for management of the operative extremity, protection of soft tissues and general facilitation of the case and primary wound closure.  ANESTHESIA:  General.  SPECIMENS:  None.  FINDINGS:  There are no obvious concerns for infection.  No purulence or abscess is identified.  BLOOD LOSS:  Approximately 800 mL.  DRAINS:  One medium Hemovac placed into the right hip joint.  COMPLICATIONS:  None apparent.  INDICATION FOR PROCEDURE:  Ms. Skuza is a 66 year old female with a history of an index right total hip replacement about a year ago.  This was complicated by a repeat fall requiring further open reduction and internal fixation, with a large cable plate and strut graft.  At this time, her wound failed to heal adequately.  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  This required resection arthroplasty, placement of antibiotic spacer.  After several months of treated in this manner, we were finally able to get her skin to heal without the need of external devices i.e. a wound VAC.  After following labs and wean out as much as possible including MRI evaluation and aspirations of any fluid collections, we felt confident that infection had been cleared.  She was ready to proceed knowing that the risks of recurrent infection particularly if the wound failed to get healed, were all possibilities, and the purpose of the procedure was benefit of pain relief and improved quality of life.  PROCEDURE IN DETAIL:  The patient was brought to the operative theater. Once adequate anesthesia was established, preoperative antibiotics, 1.5 g of vancomycin administered.  She was positioned into the left lateral decubitus position with the right side up.  The right lower extremity was then prepped and draped in sterile fashion.  Time-out was performed identifying the patient, planned procedure, and extremity.  The entire extent of incision was utilized and a portion of it excised.  Soft tissue dissection was carried down to the iliotibial band and gluteal fascia.  Given her scarred tissue, this was a bit of a challenge, however, the planes  were identified.  The iliotibial band and underlying vastus lateralis was then split, identified the distal 2/3 of the femoral shaft below where her initial periprosthetic fracture was.  I dissected this bone out.  I then used an oscillating saw, removed about a cm of the bone to allow for fresh cut on the end of the bone, but also did help identify the canal.  The canal was identified and an intramedullary rod was passed.  With this canal identified, I then began reaming with straight reamers for the solution system and reamed up to 14.5 mm with excellent bone contact in this isthmus of the femur.  At this  point, with this was prepared, I went ahead and now identified the proximal bone that had been fragmented from her previous fractures and removal of previous implants.  Identified the large proximal segment of the greater trochanter with gluteus musculature attached, there were several other smaller pieces probably a segment including the lesser trochanter.  These pieces were removed as they were nonviable and difficult to ascertain the exact location based on the current anatomy.  Once this was identified, I went ahead and identified the acetabulum. The acetabulum had a significant amount of scar tissue around it. Retractors were placed.  I identified the acetabulum and then was able to ream.  There was very good bone preparation and reamed up to 51 reamer, and then placed a 52 cup.  The cup seemed to be positioned at 40 degrees of abduction and 20 degrees of forward flexion.  Two cancellous screws were placed and the plan was to place a 36 +4 neutral liner.  At this point, I went ahead and did a trial reduction with an 8 inch solution stem.  I went ahead and impacted this down and identifying the depth.  I put it about 20 degrees of anteversion, did a trial reduction. Her leg lengths appeared to be closed.  Hip pain would be stable.  With this trial in place, I then tried to using a Jackson clamp.  I was able to pull the greater trochanter fragment segment around over top of the femur laterally and identifying these landmarks which were remaining, I came into the plan for attempted reapproximation of this.  Given the trial reduction, the stability of the hip, the combined anteversion, the leg lengths etc., I chose the 8 inch large statue solution stem, 15 mm in diameter.  I marked the depth of the trial implant and then marked this on the final implant.  I confirmed that the reamings were penetrated enough into the canal for allowing the final stem to be seat appropriately.  At  this point, a trial liner was removed out of the acetabulum.  A hole eliminator was placed.  The final 36 +4 neutral AltrX liner was impacted in position.  At this point, the size 15 large statue solution stem was impacted again about 20 degrees of anteversion and was impacted the level and it was marked onto the prosthesis.  The hip was then reduced.  I ended up choosing a 36 +5 ball, and impacted this onto clean and dry trunnion.  Once this was done, I did pull the greater trochanter segment back over top of the lateral aspect of the prosthesis.  I then used a cable proximal through the hole of the prosthesis.  I used a second cable that was wrapped around.  The distal portion then retained a greater trochanter segment and then made the direct contact for  the prosthesis. I then placed a third cable around the two of these cables through the hole on the solution stem in and around the distal cable to apply a separate force vector to hold this in place.  At this point, the case was basically concluded, I had irrigated the hip prior to placement of final implants with 3 L of normal saline solution. At this point, we irrigated with another L of normal saline solution with pulse lavage.  I was able to reapproximate the iliotibial band using #1 Vicryl and 0 V-Loc.  I placed a medium Hemovac drain deep.  I finished reapproximated the gluteal fascia.  The remaining of the wound was closed with 2-0 Vicryl and staples on the skin.  The skin was cleaned, dried, dressed sterilely using Mepilex dressings.  The drain site was dressed separately with gauze and Tegaderm.  She was then extubated and brought to the recovery room in stable condition, tolerating the procedure well.     Pietro Cassis Alvan Dame, M.D.     MDO/MEDQ  D:  03/25/2012  T:  03/26/2012  Job:  [id number]

## 2012-03-26 NOTE — Progress Notes (Addendum)
Clinical Social Work Department CLINICAL SOCIAL WORK PLACEMENT NOTE 03/26/2012  Patient:  [name], [name]  Account Number:  [id number] Admit date:  03/25/2012  Clinical Social Worker:  Werner Lean, LCSW  Date/time:  03/26/2012 11:27 AM  Clinical Social Work is seeking post-discharge placement for this patient at the following level of care:   [place]   (*CSW will update this form in Epic as items are completed)   03/26/2012  Patient/family provided with [place] Department of Clinical Social Work's list of facilities offering this level of care within the geographic area requested by the patient (or if unable, by the patient's family).  03/26/2012  Patient/family informed of their freedom to choose among providers that offer the needed level of care, that participate in Medicare, Medicaid or managed care program needed by the patient, have an available bed and are willing to accept the patient.    Patient/family informed of [facility]' ownership interest in [facility], as well as of the fact that they are under no obligation to receive care at this facility.  PASARR submitted to EDS on ( pre-existing ) PASARR number received from EDS on 04/03/2011  FL2 transmitted to all facilities in geographic area requested by pt/family on  03/26/2012 FL2 transmitted to all facilities within larger geographic area on   Patient informed that his/her managed care company has contracts with or will negotiate with  certain facilities, including the following:     Patient/family informed of bed offers received:  03/28/12 Patient chooses bed at [facility] Physician recommends and patient chooses bed at    Patient to be transferred to [facility] skilled nursing facility on  03/31/12 Patient to be transferred to facility by ambulance  The following physician request were entered in Epic:  Additional Comments: 03/31/12 - [place] Ehl[place] Givens, LCSW: CSW talked with patient and  husband regarding d/c to [facility] for short-term rehab. Patient advised need for ambulance transport. Facility contacted and discharge information sent to SNF via [facility].  Werner Lean LCSW [phone]

## 2012-03-26 NOTE — Progress Notes (Signed)
Patient hypotensive. HR 60's. Patient denies SOB. [employer] PA notified, new orders given.

## 2012-03-26 NOTE — Consult Note (Signed)
Reason for Consult: Hypotension. Referring Physician: Dr.Olin. [name] is an 66 y.o. female.  HPI: 66 year-old female with history of ESRD on hemodialysis on Monday Wednesday and Friday, atrial fibrillation not on Coumadin secondary to GI bleed, hypertension who had undergone right hip surgery yesterday was found to be hypotensive after surgery with a blood pressure in the 60 systolic. Medical consult was called to see patient for hypotension. On exam at the bedside patient denies any chest pain shortness of breath nausea vomiting or diarrhea. Just after the surgery patient did get a small dose of Dilaudid for the right hip pain. Patient initially was given 500 cc normal saline bolus and the blood pressure came up to [id number] systolic but again dropped to 60 systolic. Another 500 cc normal saline bolus was given after which blood pressure at this time is stable around [id number] systolic.  Past Medical History  Diagnosis Date  . Fractures, stress     in both feet--6 OR 7 YRS AGO--RESOLVED  . 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 Gout     doesn't require meds   . Anemia   . Blood transfusion     never had a reaction to blood transfusion  . Arthritis     HIP  . Cardiomyopathy   . Hypercalcemia     09/10/11  . HCAP (healthcare-associated pneumonia)     09/10/11  . CHF (congestive heart failure)   . Hypertension     takes Diltiazem daily   . Asthma     as a child  . Eczema   . Constipation     takes Miralax daily  . Oligouria   . Depression     takes Zoloft daily  . Stroke 2009    denies residual  . ESRD (end stage renal disease) on dialysis 09/2011    dialysis on M/W/F on [address]  . Peripheral vascular disease   . DVT of lower extremity, bilateral 12/21/11    "they're there now; been there for 2 wks"  . Atrial fibrillation      HX OF CEREBRAL BLEED WHILE ON COUMADIN-SO PT NOT ON ANY BLOOD THINNERS NOW  . Arrhythmia     takes Metoprolol daily  . Sleep apnea     HAS CPAP-sleep  study done 2007--PT NOT USING AT PRESENT    Past Surgical History  Procedure Date  . Cardiac valve surgery 1960     FOR ATRIAL SEPTAL DEFECT  . Joint replacement     right hip and right femur x 2  . I&d extremity 09/15/2011    Procedure: IRRIGATION AND DEBRIDEMENT EXTREMITY;  Surgeon: [name], MD;  Location: [place];  Service: Orthopedics;  Laterality: Right;  . Av fistula placement 09/28/2011    Procedure: ARTERIOVENOUS (AV) FISTULA CREATION;  Surgeon: Rosetta Posner, MD;  Location: [place];  Service: Vascular;  Laterality: Left;  . Cystoscopy     many yrs ago  . Colonoscopy   . Irrigation and debridement abscess 12/21/11    right hip  . Vena cava filter placement 11/2011  . Total hip arthroplasty 02/2011    right  . Femur fracture surgery 03/2011; 09/03/2011    right; "had 2, 2 wk apart in 2012; broke it again 08/2011 & had OR"  . Insertion of dialysis catheter     Procedure: INSERTION OF DIALYSIS CATHETER;  Surgeon: Rosetta Posner, MD;  Location: [place];  Service: Vascular;  Laterality: Right;  . Cholecystectomy  1980's  . Incision and drainage hip 12/21/2011    Procedure: IRRIGATION AND DEBRIDEMENT HIP;  Surgeon: [name], MD;  Location: [place];  Service: Orthopedics;  Laterality: Right;  I&D RIGHT HIP WITH PLACEMENT ANTIBIOTIC SPACER    Family History  Problem Relation Age of Onset  . Diabetes Mother   . Hypertension Mother   . Hodgkin's lymphoma  32    dscd---HODGKINS DISEASE  . Cancer Father     Social History:  reports that she has never smoked. She has never used smokeless tobacco. She reports that she does not drink alcohol or use illicit drugs.  Allergies:  Allergies  Allergen Reactions  . Warfarin Sodium Other (See Comments)    Caused her to have a stroke    Medications: I have reviewed the patient's current medications.  Results for orders placed during the hospital encounter of 03/25/12 (from the past 48 hour(s))  URINALYSIS, ROUTINE W REFLEX MICROSCOPIC      Status: Abnormal   Collection Time   03/25/12  1:54 PM      Component Value Range Comment   Color, Urine AMBER (*) YELLOW BIOCHEMICALS MAY BE AFFECTED BY COLOR   APPearance CLOUDY (*) CLEAR    Specific Gravity, Urine 1.024  1.005 - 1.030    pH 5.5  5.0 - 8.0    Glucose, UA NEGATIVE  NEGATIVE mg/dL    Hgb urine dipstick NEGATIVE  NEGATIVE    Bilirubin Urine SMALL (*) NEGATIVE    Ketones, ur TRACE (*) NEGATIVE mg/dL    Protein, ur 100 (*) NEGATIVE mg/dL    Urobilinogen, UA 0.2  0.0 - 1.0 mg/dL    Nitrite NEGATIVE  NEGATIVE    Leukocytes, UA SMALL (*) NEGATIVE   URINE MICROSCOPIC-ADD ON     Status: Abnormal   Collection Time   03/25/12  1:54 PM      Component Value Range Comment   Squamous Epithelial / LPF MANY (*) RARE    WBC, UA 7-10  <3 WBC/hpf    RBC / HPF 3-6  <3 RBC/hpf    Bacteria, UA MANY (*) RARE    Casts GRANULAR CAST (*) NEGATIVE   CBC     Status: Abnormal   Collection Time   03/25/12  2:30 PM      Component Value Range Comment   WBC 7.6  4.0 - 10.5 K/uL    RBC 4.75  3.87 - 5.11 MIL/uL    Hemoglobin 12.3  12.0 - 15.0 g/dL    HCT 38.7  36.0 - 46.0 %    MCV 81.5  78.0 - 100.0 fL    MCH 25.9 (*) 26.0 - 34.0 pg    MCHC 31.8  30.0 - 36.0 g/dL    RDW 18.1 (*) 11.5 - 15.5 %    Platelets 291  150 - 400 K/uL   BASIC METABOLIC PANEL     Status: Abnormal   Collection Time   03/25/12  2:30 PM      Component Value Range Comment   Sodium 138  135 - 145 mEq/L    Potassium 3.3 (*) 3.5 - 5.1 mEq/L    Chloride 98  96 - 112 mEq/L    CO2 32  19 - 32 mEq/L    Glucose, Bld 90  70 - 99 mg/dL    BUN 15  6 - 23 mg/dL    Creatinine, Ser 2.40 (*) 0.50 - 1.10 mg/dL    Calcium 9.9  8.4 - 10.5 mg/dL  GFR calc non Af Amer 20 (*) >90 mL/min    GFR calc Af Amer 24 (*) >90 mL/min   PROTIME-INR     Status: Normal   Collection Time   03/25/12  2:30 PM      Component Value Range Comment   Prothrombin Time 13.2  11.6 - 15.2 seconds    INR 0.98  0.00 - 1.49   APTT     Status: Normal    Collection Time   03/25/12  2:30 PM      Component Value Range Comment   aPTT 30  24 - 37 seconds   TYPE AND SCREEN     Status: Normal   Collection Time   03/25/12  2:30 PM      Component Value Range Comment   ABO/RH(D) O POS      Antibody Screen NEG      Sample Expiration 03/28/2012     GLUCOSE, CAPILLARY     Status: Abnormal   Collection Time   03/25/12 10:46 PM      Component Value Range Comment   Glucose-Capillary 133 (*) 70 - 99 mg/dL   CBC     Status: Abnormal   Collection Time   03/26/12  2:40 AM      Component Value Range Comment   WBC 16.3 (*) 4.0 - 10.5 K/uL    RBC 3.77 (*) 3.87 - 5.11 MIL/uL    Hemoglobin 9.7 (*) 12.0 - 15.0 g/dL    HCT 30.6 (*) 36.0 - 46.0 %    MCV 81.2  78.0 - 100.0 fL    MCH 25.7 (*) 26.0 - 34.0 pg    MCHC 31.7  30.0 - 36.0 g/dL    RDW 17.7 (*) 11.5 - 15.5 %    Platelets 229  150 - 400 K/uL     Dg Pelvis Portable  03/25/2012  *RADIOLOGY REPORT*  Clinical Data: Postop right hip surgery.  PORTABLE PELVIS  Comparison: Bone survey 09/21/2011.  Findings: Interval placement of a right total hip arthroplasty with long stem femoral component and cerclage wire fixation of greater trochanteric fragments.  The femoral component bridges an osteotomy site.  The distal tip of the femoral component is not included on the image.  There is subcutaneous emphysema and surgical drains consistent with recent surgery.  Degenerative changes in the left hip.  No focal bone lesion appreciated.  IMPRESSION: Postoperative changes with right total hip arthroplasty.  No apparent complication.  Incomplete visualization of the femoral component.   Original Report Authenticated By: Neale Burly, M.D.    Dg Hip Portable 1 View Right  03/25/2012  *RADIOLOGY REPORT*  Clinical Data: Postop right hip surgery.  PORTABLE RIGHT HIP - 1 VIEW  Comparison: None.  Findings: Lateral views of the right hip demonstrate right hip arthroplasty with long stem femoral component and cerclage wires.  The distal portion of the femoral component is visualized but due to underpenetration, limited detail is available.  Skin clips and subcutaneous emphysema with surgical drains consistent with recent surgery.  IMPRESSION: Postoperative changes with right hip arthroplasty demonstrated.   Original Report Authenticated By: Neale Burly, M.D.     Review of Systems  Constitutional: Negative.   HENT: Negative.   Eyes: Negative.   Respiratory: Negative.   Cardiovascular: Negative.   Gastrointestinal: Negative.   Genitourinary: Negative.   Musculoskeletal:       Right hip pain.  Skin: Negative.   Neurological: Negative.   Endo/Heme/Allergies: Negative.   Psychiatric/Behavioral:  Negative.    Blood pressure 112/75, pulse 77, temperature 97.4 F (36.3 C), temperature source Oral, resp. rate 16, height 5\' 5"  (1.651 m), weight 86.6 kg (190 lb 14.7 oz), SpO2 100.00%. Physical Exam  Constitutional: She is oriented to person, place, and time. She appears well-developed and well-nourished. No distress.  HENT:  Head: Normocephalic and atraumatic.  Eyes: Conjunctivae normal are normal. Pupils are equal, round, and reactive to light. Right eye exhibits no discharge. Left eye exhibits no discharge.  Neck: Normal range of motion. Neck supple.  Cardiovascular: Normal rate and regular rhythm.   Respiratory: Effort normal and breath sounds normal. No respiratory distress. She has no wheezes. She has no rales.  GI: Soft. Bowel sounds are normal. She exhibits no distension. There is no tenderness. There is no rebound.  Musculoskeletal:       Right hip dressing seen.  Neurological: She is alert and oriented to person, place, and time.       Moves all extremities.  Skin: She is not diaphoretic.    Assessment/Plan: #1. Hypotension - hypotension most likely from fluid loss during surgery and pain medication. Patient had lost around 800 cc of blood during surgery. At this time blood pressure has improved  after 1 L normal saline bolus. Patient is not hypoxic and not short of breath. I have ordered repeat CBC, metabolic panel lactate and procalcitonin level. At this time since patient's blood pressure is stable we will continue to monitor. #2. ESRD on hemodialysis on Monday Wednesday and Friday - dialysis per nephrologist. Patient is due for dialysis today. #3. Atrial fibrillation not on Coumadin secondary to cerebral hemorrhage presently rate controlled. #4. History of hypertension - continue to monitor closely. #5. History of DVT status post IVC filter placement. #6. Right hip replacement - per orthopedic.  Thanks for involving [name] in patient's care we will follow along with you.  Mayrin Schmuck N. 03/26/2012, 3:08 AM

## 2012-03-26 NOTE — Progress Notes (Signed)
   Subjective: 1 Day Post-Op Procedure(s) (LRB): TOTAL HIP REVISION (Right)   Patient reports pain as mild, pain well controlled. She has some issues with hypotension, she was responsive to a bolus during the night. Some issues again with hypotension this morning. Rapid response nurse was called, continued boluses as she is being transferred to stepdown unit at [facility]. Transfer was already known to happen this morning do to the need for dialysis, but changed to stepdown do to the hypotension issues.  Objective:   VITALS:   Filed Vitals:   03/26/12 0917  BP: 74/44  Pulse: 81  Temp: 97.3 F (36.3 C)   Resp: 14    Neurovascular intact Incision: dressing C/D/I Compartment soft  LABS  Basename 03/26/12 0240 03/25/12 1430  HGB 9.7* 12.3  HCT 30.6* 38.7  WBC 16.3* 7.6  PLT 229 291     Basename 03/26/12 0240 03/25/12 1430  NA 139 138  K 3.6 3.3*  BUN 18 15  CREATININE 2.70* 2.40*  GLUCOSE 143* 90     Assessment/Plan: 1 Day Post-Op Procedure(s) (LRB): TOTAL HIP REVISION (Right) Maintain HV drain to assist in continued drainage. Advance diet Up with therapy Transfer patient to [facility] to stepdown and for dialysis.   Expected ABLA  Treated with iron and will observe   Obese (BMI 30-39.9) Estimated Body mass index is 31.77 kg/(m^2) as calculated from the following:   Height as of this encounter: 5\' 5" (1.651 m).   Weight as of this encounter: 190 lb 14.7 oz(86.6 kg). Patient also counseled that weight may inhibit the healing process Patient counseled that losing weight will help with future health issues     [place]. Aaradhya Kysar   PAC  03/26/2012, 10:44 AM

## 2012-03-26 NOTE — Progress Notes (Signed)
Utilization review completed.  

## 2012-03-26 NOTE — Consult Note (Signed)
[name] 03/26/2012 Mahesh Sizemore D Requesting Physician:  Dr. Alvan Dame  Reason for Consult:  Need for dialysis and related services for ESRD pt s/p reimplantation of R THA HPI: The patient is a 66 y.o. year-old w hx of HTN, afib, ESRD, hemorrhagic CVA (off coumdin now) and asthma. Patient had R right THA 02/2011, had fracture and infection of joint requiring I&D/removal of hardware/antiobiotic spacer placed 09/2011. Then had repeat I&D June 2013 and now was admitted for reimplantation R THA which was done yesterday at [place] op has had some low BP's, Hb drop from 12.3 to 9.7. Otherwise is doing well. Is alert in good spirits. She has been nonambulatory since [facility] surgery in March.  She started dialysis also in March 2013.   ROS  no f/c/s  no HA, sore throat  no CP, sob   no n/v/d  no dysuria  no leg swelling  no focal wkness   Past Medical History:  Past Medical History  Diagnosis Date  . Fractures, stress     in both feet--6 OR 7 YRS AGO--RESOLVED  . 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 Gout     doesn't require meds   . Anemia   . Blood transfusion     never had a reaction to blood transfusion  . Arthritis     HIP  . Cardiomyopathy   . Hypercalcemia     09/10/11  . HCAP (healthcare-associated pneumonia)     09/10/11  . CHF (congestive heart failure)   . Hypertension     takes Diltiazem daily   . Asthma     as a child  . Eczema   . Constipation     takes Miralax daily  . Oligouria   . Depression     takes Zoloft daily  . Stroke 2009    denies residual  . ESRD (end stage renal disease) on dialysis 09/2011    dialysis on M/W/F on [address]  . Peripheral vascular disease   . DVT of lower extremity, bilateral 12/21/11    "they're there now; been there for 2 wks"  . Atrial fibrillation      HX OF CEREBRAL BLEED WHILE ON COUMADIN-SO PT NOT ON ANY BLOOD THINNERS NOW  . Arrhythmia     takes Metoprolol daily  . Sleep apnea     HAS CPAP-sleep study done  2007--PT NOT USING AT PRESENT    Past Surgical History:  Past Surgical History  Procedure Date  . Cardiac valve surgery 1960     FOR ATRIAL SEPTAL DEFECT  . Joint replacement     right hip and right femur x 2  . I&d extremity 09/15/2011    Procedure: IRRIGATION AND DEBRIDEMENT EXTREMITY;  Surgeon: [name], MD;  Location: [place];  Service: Orthopedics;  Laterality: Right;  . Av fistula placement 09/28/2011    Procedure: ARTERIOVENOUS (AV) FISTULA CREATION;  Surgeon: Rosetta Posner, MD;  Location: [place];  Service: Vascular;  Laterality: Left;  . Cystoscopy     many yrs ago  . Colonoscopy   . Irrigation and debridement abscess 12/21/11    right hip  . Vena cava filter placement 11/2011  . Total hip arthroplasty 02/2011    right  . Femur fracture surgery 03/2011; 09/03/2011    right; "had 2, 2 wk apart in 2012; broke it again 08/2011 & had OR"  . Insertion of dialysis catheter     Procedure: INSERTION OF DIALYSIS CATHETER;  Surgeon: Rosetta Posner, MD;  Location: [place];  Service: Vascular;  Laterality: Right;  . Cholecystectomy     1980's  . Incision and drainage hip 12/21/2011    Procedure: IRRIGATION AND DEBRIDEMENT HIP;  Surgeon: [name], MD;  Location: [place];  Service: Orthopedics;  Laterality: Right;  I&D RIGHT HIP WITH PLACEMENT ANTIBIOTIC SPACER  . Total hip revision 03/25/2012    Procedure: TOTAL HIP REVISION;  Surgeon: [name], MD;  Location: [facility];  Service: Orthopedics;  Laterality: Right;  Right Total Hip Reimplantation    Family History:  Family History  Problem Relation Age of Onset  . Diabetes Mother   . Hypertension Mother   . Hodgkin's lymphoma  32    dscd---HODGKINS DISEASE  . Cancer Father    Social History:  reports that she has never smoked. She has never used smokeless tobacco. She reports that she does not drink alcohol or use illicit drugs.  Allergies:  Allergies  Allergen Reactions  . Warfarin Sodium Other (See Comments)    Caused her to have  a stroke    Home medications: Prior to Admission medications   Medication Sig Start Date End Date Taking? Authorizing Provider  acetaminophen (TYLENOL) 500 MG tablet Take 1,000 mg by mouth every 6 (six) hours as needed. Pain   Yes Historical Provider, MD  calcium acetate (PHOSLO) 667 MG capsule Take 667 mg by mouth 3 (three) times daily.    Yes Historical Provider, MD  diltiazem (CARDIZEM CD) 240 MG 24 hr capsule Take 240 mg by mouth daily after breakfast.    Yes Historical Provider, MD  diphenhydrAMINE (BENADRYL) 25 MG tablet Take 25 mg by mouth every 6 (six) hours as needed. Itching/rash   Yes Historical Provider, MD  docusate sodium (COLACE) 100 MG capsule Take 100 mg by mouth daily.   Yes Historical Provider, MD  metoprolol (LOPRESSOR) 50 MG tablet Take 50 mg by mouth 2 (two) times daily. Hold on mornings of dialysis (M,W,F) 10/01/11 09/30/12 Yes Ripudeep Krystal Eaton, MD  polyethylene glycol (MIRALAX / GLYCOLAX) packet Take 17 g by mouth 2 (two) times daily.   Yes Historical Provider, MD  sertraline (ZOLOFT) 50 MG tablet Take 50 mg by mouth every morning. Take before breakfast   Yes Historical Provider, MD  sotalol (BETAPACE) 80 MG tablet Take 80 mg by mouth every other day.   Yes Historical Provider, MD  feeding supplement (PRO-STAT SUGAR FREE 64) LIQD Take 30 mLs by mouth 2 (two) times daily.    Historical Provider, MD  folic acid-vitamin b complex-vitamin c-selenium-zinc (DIALYVITE) 3 MG TABS Take 1 tablet by mouth daily.    Historical Provider, MD  HYDROcodone-acetaminophen (NORCO) 7.5-325 MG per tablet Take 1 tablet by mouth every 4 (four) hours as needed. Pain    Historical Provider, MD    Inpatient medications:    . sodium chloride   Intravenous Once  . sodium chloride   Intravenous Once  . sodium chloride   Intravenous Once  . docusate sodium  100 mg Oral BID  . feeding supplement  30 mL Oral BID  . ferrous sulfate  325 mg Oral TID PC  . influenza  inactive virus vaccine  0.5 mL  Intramuscular Tomorrow-1000  . nystatin   Topical BID  . polyethylene glycol  17 g Oral BID  . sertraline  50 mg Oral q morning - 10a  . sodium chloride  250 mL Intravenous Once  . vancomycin  1,000 mg Intravenous  Q12H  . DISCONTD:  ceFAZolin (ANCEF) IV  2 g Intravenous 60 min Pre-Op  . DISCONTD: chlorhexidine  60 mL Topical Once  . DISCONTD: diltiazem  240 mg Oral QPC breakfast  . DISCONTD: HYDROcodone-acetaminophen  1-2 tablet Oral Q4H  . DISCONTD: metoprolol  50 mg Oral BID  . DISCONTD: sodium chloride  500 mL Intravenous Once  . DISCONTD: sotalol  80 mg Oral QODAY    Labs: Basic Metabolic Panel:  Lab [id number] 0240 03/25/12 1430  NA 139 138  K 3.6 3.3*  CL 105 98  CO2 26 32  GLUCOSE 143* 90  BUN 18 15  CREATININE 2.70* 2.40*  ALB -- --  CALCIUM 8.4 9.9  PHOS -- --   Liver Function Tests:  Lab 03/26/12 0240  AST 21  ALT 15  ALKPHOS 95  BILITOT 0.4  PROT 5.2*  ALBUMIN 2.6*   No results found for this basename: LIPASE:3,AMYLASE:3 in the last 168 hours No results found for this basename: AMMONIA:3 in the last 168 hours CBC:  Lab 03/26/12 0240 03/25/12 1430  WBC 16.3* 7.6  NEUTROABS -- --  HGB 9.7* 12.3  HCT 30.6* 38.7  MCV 81.2 81.5  PLT 229 291   PT/INR: @labrcntip (inr:5) Cardiac Enzymes:  Lab 03/26/12 0240  CKTOTAL 374*  CKMB 2.8  CKMBINDEX --  TROPONINI <0.30   CBG:  Lab 03/25/12 2246  GLUCAP 133*    Iron Studies: No results found for this basename: IRON:30,TIBC:30,TRANSFERRIN:30,FERRITIN:30 in the last 168 hours  Xrays/Other Studies: Dg Pelvis Portable  03/25/2012  *RADIOLOGY REPORT*  Clinical Data: Postop right hip surgery.  PORTABLE PELVIS  Comparison: Bone survey 09/21/2011.  Findings: Interval placement of a right total hip arthroplasty with long stem femoral component and cerclage wire fixation of greater trochanteric fragments.  The femoral component bridges an osteotomy site.  The distal tip of the femoral component is not included on  the image.  There is subcutaneous emphysema and surgical drains consistent with recent surgery.  Degenerative changes in the left hip.  No focal bone lesion appreciated.  IMPRESSION: Postoperative changes with right total hip arthroplasty.  No apparent complication.  Incomplete visualization of the femoral component.   Original Report Authenticated By: Neale Burly, M.D.    Dg Hip Portable 1 View Right  03/25/2012  *RADIOLOGY REPORT*  Clinical Data: Postop right hip surgery.  PORTABLE RIGHT HIP - 1 VIEW  Comparison: None.  Findings: Lateral views of the right hip demonstrate right hip arthroplasty with long stem femoral component and cerclage wires. The distal portion of the femoral component is visualized but due to underpenetration, limited detail is available.  Skin clips and subcutaneous emphysema with surgical drains consistent with recent surgery.  IMPRESSION: Postoperative changes with right hip arthroplasty demonstrated.   Original Report Authenticated By: Neale Burly, M.D.     Physical Exam:  Blood pressure 86/50, pulse 88, temperature 97.4 F (36.3 C), temperature source Oral, resp. rate 19, height 5\' 5"  (1.651 m), weight 86.6 kg (190 lb 14.7 oz), SpO2 100.00%.  Gen: alert, calm, looks good, no distress or toxicity Skin: no rash, cyanosis HEENT:  EOMI, sclera anicteric, throat clear Neck: no JVD, no bruits or LAN Chest: clear throughout, no rales or wheezing Heart: regular, no rub or gallop, no murmur Abdomen: soft, obese, NT/ND, +bs Ext: no LLE edema, no RLE edema, dressing R hip not removed Neuro: alert, Ox3, no focal deficit, 4/5 strength in L leg, 4/5 in UE's  Outpatient HD: [facility]  MWF, 3hr 45 min, 96.5kg EDW, 2K/2.25Ca bath, LUA AVF.  EPO 5000u, venofer 100 every other wk (next 03/26/12), Heparin 10,000u. No vit D. Last PTH 16.6   Impression 1. S/P reimplantation of R THA, stable post op except for low BP's 2. ESRD, cont hd mwf. No vol excess, BP low, keep even  w hd today. No heparin post op today 3. HTN- dilt 240 xl/d and metoprolol 50 bid. Hold due to low BP's 4. 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 Afib- BB and dilt po, sotalol, no coumadin due to hx bleed 6. Hx hemorrhagic CVA 7. Anemia- cont epo, iv iron  P- HD today, keep even, epo, iv Aurea Graff  MD [place] [phone] pgr    [phone] cell 03/26/2012, 3:32 PM

## 2012-03-27 ENCOUNTER — Inpatient Hospital Stay (HOSPITAL_COMMUNITY): Payer: Medicare Other

## 2012-03-27 DIAGNOSIS — I959 Hypotension, unspecified: Secondary | ICD-10-CM

## 2012-03-27 DIAGNOSIS — D649 Anemia, unspecified: Secondary | ICD-10-CM

## 2012-03-27 DIAGNOSIS — Z89629 Acquired absence of unspecified hip joint: Secondary | ICD-10-CM

## 2012-03-27 DIAGNOSIS — N186 End stage renal disease: Secondary | ICD-10-CM

## 2012-03-27 LAB — CBC
HCT: 23.3 % — ABNORMAL LOW (ref 36.0–46.0)
Hemoglobin: 7.3 g/dL — ABNORMAL LOW (ref 12.0–15.0)
MCH: 25.8 pg — ABNORMAL LOW (ref 26.0–34.0)
MCHC: 31.3 g/dL (ref 30.0–36.0)
MCV: 82.3 fL (ref 78.0–100.0)
Platelets: 164 10*3/uL (ref 150–400)
RBC: 2.83 MIL/uL — ABNORMAL LOW (ref 3.87–5.11)
RDW: 18.5 % — ABNORMAL HIGH (ref 11.5–15.5)
WBC: 9.2 10*3/uL (ref 4.0–10.5)

## 2012-03-27 LAB — PREPARE RBC (CROSSMATCH)

## 2012-03-27 MED ORDER — OXYCODONE-ACETAMINOPHEN 5-325 MG PO TABS
1.0000 | ORAL_TABLET | ORAL | Status: DC | PRN
  Administered 2012-03-27 – 2012-03-31 (×17): 1 via ORAL
  Filled 2012-03-27 (×15): qty 1

## 2012-03-27 MED ORDER — ACETAMINOPHEN 325 MG PO TABS
650.0000 mg | ORAL_TABLET | Freq: Four times a day (QID) | ORAL | Status: DC | PRN
  Administered 2012-03-28: 650 mg via ORAL
  Filled 2012-03-27: qty 2

## 2012-03-27 NOTE — Clinical Social Work Note (Signed)
Clinical Social Worker attempted to speak with patient regarding bed offers received, but patient was off the floor for a procedure or test. CSW will continue to follow.   Leandro Reasoner MSW, [place]

## 2012-03-27 NOTE — Progress Notes (Signed)
Patient ID: [name], female   DOB: 1945-11-20, 66 y.o.   MRN: [id number] Subjective: 2 Days Post-Op Procedure(s) (LRB): TOTAL HIP REVISION (Right)    Patient reports pain as moderate.  Objective:   VITALS:   Filed Vitals:   03/27/12 0400  BP: 97/53  Pulse: 104  Temp:   Resp: 27    Neurovascular intact Incision: dressing C/D/I  LABS  Basename 03/27/12 0435 03/26/12 0240 03/25/12 1430  HGB 7.3* 9.7* 12.3  HCT 23.3* 30.6* 38.7  WBC 9.2 16.3* 7.6  PLT 164 229 291     Basename 03/26/12 0240 03/25/12 1430  NA 139 138  K 3.6 3.3*  BUN 18 15  CREATININE 2.70* 2.40*  GLUCOSE 143* 90     Basename 03/25/12 1430  LABPT --  INR 0.98     Assessment/Plan: 2 Days Post-Op Procedure(s) (LRB): TOTAL HIP REVISION (Right)   Due to post op blood loss anemia, impaired renal function, hypotension plan to transfuse 2 units PRBCs today HD mon, wed, fri OOB with therapy if able, PWB RLE

## 2012-03-27 NOTE — Consult Note (Signed)
Subjective: Hb down 7.3, no new complaints, says drain was removed from R hip  Objective Vital signs in last 24 hours: Filed Vitals:   03/27/12 0351 03/27/12 0400 03/27/12 0800 03/27/12 0935  BP:  97/53 104/56 92/58  Pulse:  104 96 104  Temp: 99.9 F (37.7 C)  99 F (37.2 C) 98.9 F (37.2 C)  TempSrc: Oral  Oral Oral  Resp:  27 20 16   Height:      Weight:    91.2 kg (201 lb 1 oz)  SpO2:  98% 100% 92%   Weight change:   Intake/Output Summary (Last 24 hours) at 03/27/12 1054 Last data filed at 03/27/12 0800  Gross per 24 hour  Intake    980 ml  Output    390 ml  Net    590 ml   Labs: Basic Metabolic Panel:  Lab [id number] 0240 03/25/12 1430  NA 139 138  K 3.6 3.3*  CL 105 98  CO2 26 32  GLUCOSE 143* 90  BUN 18 15  CREATININE 2.70* 2.40*  ALB -- --  CALCIUM 8.4 9.9  PHOS -- --   Liver Function Tests:  Lab 03/26/12 0240  AST 21  ALT 15  ALKPHOS 95  BILITOT 0.4  PROT 5.2*  ALBUMIN 2.6*   No results found for this basename: LIPASE:3,AMYLASE:3 in the last 168 hours No results found for this basename: AMMONIA:3 in the last 168 hours CBC:  Lab 03/27/12 0435 03/26/12 0240 03/25/12 1430  WBC 9.2 16.3* 7.6  NEUTROABS -- -- --  HGB 7.3* 9.7* 12.3  HCT 23.3* 30.6* 38.7  MCV 82.3 81.2 81.5  PLT 164 229 291   PT/INR: @labrcntip (inr:5) Cardiac Enzymes:  Lab 03/26/12 0240  CKTOTAL 374*  CKMB 2.8  CKMBINDEX --  TROPONINI <0.30   CBG:  Lab 03/25/12 2246  GLUCAP 133*    Iron Studies: No results found for this basename: IRON:30,TIBC:30,TRANSFERRIN:30,FERRITIN:30 in the last 168 hours  Physical Exam:  Blood pressure 92/58, pulse 104, temperature 98.9 F (37.2 C), temperature source Oral, resp. rate 16, height 5\' 5"  (1.651 m), weight 91.2 kg (201 lb 1 oz), SpO2 92.00%.  Gen: alert, calm, looks good, no distress or toxicity  Skin: no rash, cyanosis  HEENT: EOMI, sclera anicteric, throat clear  Neck: no JVD, no bruits or LAN  Chest: clear throughout, no  rales or wheezing  Heart: regular, no rub or gallop, no murmur  Abdomen: soft, obese, NT/ND, +bs  Ext: no LLE edema, no RLE edema, dressing R hip is dry Neuro: alert, Ox3, no focal deficit, 4/5 strength in L leg, 4/5 in UE's   Outpatient HD: [facility] MWF, 3hr 45 min, 96.5kg EDW, 2K/2.25Ca bath, LUA AVF. EPO 5000u, venofer 100 every other wk (next 03/26/12), Heparin 10,000u. No vit D. Last PTH 16.6   Impression  1. S/P reimplantation of R THA, POD #2 2. ESRD, cont hd mwf. Missed yest, HD today and tomorrow, no hep for now. Weights don't look accurate, below EDW. BP soft, keep even. 4K bath due to low K 3. HTN- dilt 240 xl/d and metoprolol 50 bid at home, on hold due to low bp 4. 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 Afib- BB/dilt and sotalol, no coumadin due to #6 6. Hx hemorrhagic CVA 7. Anemia- cont epo, iv iron, transfusing 2u w HD today   Jane Splinter  MD [place] [phone] pgr    [phone] cell 03/27/2012, 10:54 AM

## 2012-03-27 NOTE — Progress Notes (Signed)
OT Cancellation Note  Treatment cancelled today due to pt in hemodialysis. Will attempt to see tomorrow.  Almon Register [id number] 03/27/2012, 2:24 PM

## 2012-03-27 NOTE — Progress Notes (Addendum)
[facility] PROGRESS NOTE  [name] [id number] DOB: 1946/03/06 DOA: 03/25/2012 PCP: Garnet Koyanagi, DO  Assessment/Plan: Principal Problem: Post-op hypotension likely related to a combination of acute blood loss and narcotics- it is also noted that her weight was only 86 Kg on 9/11 and is now 96 kg, - not sure of the accuracy of these weights-  Question if dehydration also played a part in her hypotension- now improved - antihypertensives continue to be on hold   Tachycardia with a history of A-fib Likely due to acute blood loss and withdrawal of Beta Blockers and Cardizem- unfortunately due to continued hypotension, cannot resume these medications at this point. Will need to cont to monitor on telemetry and resume medications as soon as we are able.  H/o cerebral hemorrhage on Coumadin and therefore not currently on anticoagulation   *S/P right TH revision On 9/11- management per ortho   Expected blood loss anemia Transfuse if Hgb < 8. Will repeat Hgb today after blood transfusions complete.   ESRD on HD Dialysis per nephrology  Grade 2 diastolic dysfunction Will resume B blockers when able.    Code Status: full code Family Communication: discussed with patient  Disposition Plan: per Ortho DVT prophylaxis: per ortho   Brief narrative: The patient is a 66 y.o. year-old w hx of HTN, afib, ESRD, hemorrhagic CVA (off coumdin now) and asthma. Patient had R right THA 02/2011, had fracture and infection of joint requiring I&D/removal of hardware/antiobiotic spacer placed 09/2011. Then had repeat I&D June 2013 and now was admitted for reimplantation R THA which was done yesterday at [place] op has had some low BP's, Hb drop from 12.3 to 9.7 and [facility] service was consulted.   HPI/Subjective: Pt alert- evaluated during dialyis- no complaints- pain in hip controlled  Objective: Filed Vitals:   03/27/12 1518 03/27/12 1530 03/27/12 1552 03/27/12 1600    BP: 83/52 94/49  100/57  Pulse: 111 108 104 104  Temp:   97.4 F (36.3 C)   TempSrc:   Oral   Resp:  20 24 25   Height:      Weight:      SpO2: 100% 99% 98% 95%    Intake/Output Summary (Last 24 hours) at 03/27/12 1708 Last data filed at 03/27/12 1500  Gross per 24 hour  Intake   1430 ml  Output   1090 ml  Net    340 ml    Exam:   General:  Alert, no distress  Cardiovascular: RRR, no murmurs  Respiratory: CTA b/l   Abdomen: soft, ND, ND, BS+  Ext:no c/c/e  Data Reviewed: Basic Metabolic Panel:  Lab [id number] 0240 03/25/12 1430  NA 139 138  K 3.6 3.3*  CL 105 98  CO2 26 32  GLUCOSE 143* 90  BUN 18 15  CREATININE 2.70* 2.40*  CALCIUM 8.4 9.9  MG -- --  PHOS -- --   Liver Function Tests:  Lab 03/26/12 0240  AST 21  ALT 15  ALKPHOS 95  BILITOT 0.4  PROT 5.2*  ALBUMIN 2.6*   No results found for this basename: LIPASE:5,AMYLASE:5 in the last 168 hours No results found for this basename: AMMONIA:5 in the last 168 hours CBC:  Lab 03/27/12 0435 03/26/12 0240 03/25/12 1430  WBC 9.2 16.3* 7.6  NEUTROABS -- -- --  HGB 7.3* 9.7* 12.3  HCT 23.3* 30.6* 38.7  MCV 82.3 81.2 81.5  PLT 164 229 291   Cardiac Enzymes:  Lab 03/26/12 0240  CKTOTAL 374*  CKMB 2.8  CKMBINDEX --  TROPONINI <0.30   BNP (last 3 results) No results found for this basename: PROBNP:3 in the last 8760 hours CBG:  Lab 03/25/12 2246  GLUCAP 133*    Recent Results (from the past 240 hour(s))  MRSA PCR SCREENING     Status: Normal   Collection Time   03/26/12 11:39 AM      Component Value Range Status Comment   MRSA by PCR NEGATIVE  NEGATIVE Final      Studies: Dg Pelvis Portable  03/25/2012  *RADIOLOGY REPORT*  Clinical Data: Postop right hip surgery.  PORTABLE PELVIS  Comparison: Bone survey 09/21/2011.  Findings: Interval placement of a right total hip arthroplasty with long stem femoral component and cerclage wire fixation of greater trochanteric fragments.  The femoral  component bridges an osteotomy site.  The distal tip of the femoral component is not included on the image.  There is subcutaneous emphysema and surgical drains consistent with recent surgery.  Degenerative changes in the left hip.  No focal bone lesion appreciated.  IMPRESSION: Postoperative changes with right total hip arthroplasty.  No apparent complication.  Incomplete visualization of the femoral component.   Original Report Authenticated By: Neale Burly, M.D.    Dg Hip Portable 1 View Right  03/25/2012  *RADIOLOGY REPORT*  Clinical Data: Postop right hip surgery.  PORTABLE RIGHT HIP - 1 VIEW  Comparison: None.  Findings: Lateral views of the right hip demonstrate right hip arthroplasty with long stem femoral component and cerclage wires. The distal portion of the femoral component is visualized but due to underpenetration, limited detail is available.  Skin clips and subcutaneous emphysema with surgical drains consistent with recent surgery.  IMPRESSION: Postoperative changes with right hip arthroplasty demonstrated.   Original Report Authenticated By: Neale Burly, M.D.     Scheduled Meds:   . calcium acetate  667 mg Oral TID WC  . Chlorhexidine Gluconate Cloth  6 each Topical Q0600  . darbepoetin  12.5 mcg Intravenous Q7 days  . docusate sodium  100 mg Oral BID  . feeding supplement  30 mL Oral BID  . ferric gluconate (FERRLECIT/NULECIT) IV  125 mg Intravenous Q14 Days  . influenza  inactive virus vaccine  0.5 mL Intramuscular Tomorrow-1000  . mupirocin ointment  1 application Nasal BID  . nystatin   Topical BID  . polyethylene glycol  17 g Oral BID  . sertraline  50 mg Oral q morning - 10a  . sodium chloride  250 mL Intravenous Once   Continuous Infusions:   ________________________________________________________________________  Time spent: 20 min    [place] [facility] Pager [phone] If 8PM-8AM, please contact night-coverage at [email or web address],  password [facility] 03/27/2012, 5:08 PM  LOS: 2 days

## 2012-03-27 NOTE — Progress Notes (Signed)
PT Cancellation Note  Treatment cancelled today due to patient receiving procedure or test.  Pt currently in HD.  Will follow-up with evaluation as able.  Thanks.  Branch Pacitti M 03/27/2012, 8:57 AM  03/27/2012 Cyndia Bent, PT, DPT [phone]

## 2012-03-27 NOTE — Progress Notes (Signed)
PT Cancellation Note  Treatment cancelled today due to patient receiving procedure or test.  Pt still in HD.  Will follow.  Ninel Abdella M 03/27/2012, 1:23 PM  03/27/2012 Cyndia Bent, PT, DPT [phone]

## 2012-03-27 NOTE — Procedures (Signed)
I was present at this dialysis session. I have reviewed the session itself and made appropriate changes.   Kelly Splinter, MD [employer] 03/27/2012, 11:02 AM

## 2012-03-28 ENCOUNTER — Inpatient Hospital Stay (HOSPITAL_COMMUNITY): Payer: Medicare Other

## 2012-03-28 DIAGNOSIS — N179 Acute kidney failure, unspecified: Secondary | ICD-10-CM

## 2012-03-28 LAB — BASIC METABOLIC PANEL
BUN: 20 mg/dL (ref 6–23)
CO2: 29 mEq/L (ref 19–32)
Calcium: 8.5 mg/dL (ref 8.4–10.5)
Chloride: 101 mEq/L (ref 96–112)
Creatinine, Ser: 2.43 mg/dL — ABNORMAL HIGH (ref 0.50–1.10)
GFR calc Af Amer: 23 mL/min — ABNORMAL LOW (ref >90)
GFR calc non Af Amer: 20 mL/min — ABNORMAL LOW (ref >90)
Glucose, Bld: 126 mg/dL — ABNORMAL HIGH (ref 70–99)
Potassium: 3.5 mEq/L (ref 3.5–5.1)
Sodium: 136 mEq/L (ref 135–145)

## 2012-03-28 LAB — TYPE AND SCREEN
ABO/RH(D): O POS
Antibody Screen: NEGATIVE
Unit division: 0
Unit division: 0

## 2012-03-28 LAB — CBC
HCT: 28.7 % — ABNORMAL LOW (ref 36.0–46.0)
Hemoglobin: 9.2 g/dL — ABNORMAL LOW (ref 12.0–15.0)
MCH: 26.8 pg (ref 26.0–34.0)
MCHC: 32.1 g/dL (ref 30.0–36.0)
MCV: 83.7 fL (ref 78.0–100.0)
Platelets: 124 10*3/uL — ABNORMAL LOW (ref 150–400)
RBC: 3.43 MIL/uL — ABNORMAL LOW (ref 3.87–5.11)
RDW: 18.3 % — ABNORMAL HIGH (ref 11.5–15.5)
WBC: 8.7 10*3/uL (ref 4.0–10.5)

## 2012-03-28 MED ORDER — RENA-VITE PO TABS
1.0000 | ORAL_TABLET | Freq: Every day | ORAL | Status: DC
  Administered 2012-03-28 – 2012-03-31 (×4): 1 via ORAL
  Filled 2012-03-28 (×4): qty 1

## 2012-03-28 MED ORDER — METOPROLOL TARTRATE 12.5 MG HALF TABLET
12.5000 mg | ORAL_TABLET | Freq: Two times a day (BID) | ORAL | Status: DC
  Administered 2012-03-28 – 2012-03-31 (×7): 12.5 mg via ORAL
  Filled 2012-03-28 (×8): qty 1

## 2012-03-28 MED FILL — Fentanyl Citrate Inj 0.05 MG/ML: INTRAMUSCULAR | Qty: 2 | Status: AC

## 2012-03-28 NOTE — Consult Note (Addendum)
[facility] PROGRESS NOTE  [name] [id number] DOB: [birth year] DOA: 03/25/2012 PCP: Garnet Koyanagi, DO  Assessment/Plan: Principal Problem: Post-op hypotension likely related to a combination of acute blood loss and narcotics- it is also noted that her weight was only 86 Kg on 9/11 and is now 96 kg, - not sure of the accuracy of these weights-  Question if dehydration also played a part in her hypotension- now improved -low dose Metoprolol resumed- HR and BP stable.  Tachycardia with a history of A-fib Likely due to acute blood loss and withdrawal of Beta Blockers and Cardizem- will be resumed low dose of Metoprolol today while watching BP closely.  H/o cerebral hemorrhage on Coumadin and therefore not currently on anticoagulation   S/P right TH revision On 9/11- management per ortho   Expected blood loss anemia Transfuse if Hgb < 8. S/p 2 U PRBC on 9/12- Hgb stable thus far  ESRD on HD Dialysis per nephrology  Grade 2 diastolic dysfunction Resumed B blockers    Code Status: full code Family Communication: discussed with patient  Disposition Plan: per Ortho DVT prophylaxis: per ortho   Brief narrative: The patient is a 66 y.o. year-old w hx of HTN, afib, ESRD, hemorrhagic CVA (off coumdin now) and asthma. Patient had R right THA 02/2011, had fracture and infection of joint requiring I&D/removal of hardware/antiobiotic spacer placed 09/2011. Then had repeat I&D June 2013 and now was admitted for reimplantation R THA which was done yesterday at [facility] op has had some low BP's, Hb drop from 12.3 to 9.7 and [facility] service was consulted.   HPI/Subjective: No complaints.   Objective: Filed Vitals:   03/28/12 1500 03/28/12 1535 03/28/12 1540 03/28/12 1630  BP:  107/43  130/70  Pulse: 107 104 102 110  Temp:      TempSrc:    Oral  Resp: 18 22 22    Height:      Weight:      SpO2: 98% 99% 99% 99%    Intake/Output Summary (Last 24 hours) at  03/28/12 1826 Last data filed at 03/28/12 1111  Gross per 24 hour  Intake    280 ml  Output   1039 ml  Net   -759 ml    Exam:   General:  Alert, no distress- attempting to mobilize to bedside chair with PT  Cardiovascular: RRR, no murmurs, mild tachycardia  Respiratory: CTA b/l   Abdomen: soft, ND, ND, BS+  Ext:no c/c/e  Data Reviewed: Basic Metabolic Panel:  Lab 99991111 0727 03/26/12 0240 03/25/12 1430  NA 136 139 138  K 3.5 3.6 3.3*  CL 101 105 98  CO2 29 26 32  GLUCOSE 126* 143* 90  BUN 20 18 15   CREATININE 2.43* 2.70* 2.40*  CALCIUM 8.5 8.4 9.9  MG -- -- --  PHOS -- -- --   Liver Function Tests:  Lab 03/26/12 0240  AST 21  ALT 15  ALKPHOS 95  BILITOT 0.4  PROT 5.2*  ALBUMIN 2.6*   No results found for this basename: LIPASE:5,AMYLASE:5 in the last 168 hours No results found for this basename: AMMONIA:5 in the last 168 hours CBC:  Lab 03/28/12 0727 03/27/12 0435 03/26/12 0240 03/25/12 1430  WBC 8.7 9.2 16.3* 7.6  NEUTROABS -- -- -- --  HGB 9.2* 7.3* 9.7* 12.3  HCT 28.7* 23.3* 30.6* 38.7  MCV 83.7 82.3 81.2 81.5  PLT 124* 164 229 291   Cardiac Enzymes:  Lab 03/26/12 0240  CKTOTAL 374*  CKMB 2.8  CKMBINDEX --  TROPONINI <0.30   BNP (last 3 results) No results found for this basename: PROBNP:3 in the last 8760 hours CBG:  Lab 03/25/12 2246  GLUCAP 133*    Recent Results (from the past 240 hour(s))  MRSA PCR SCREENING     Status: Normal   Collection Time   03/26/12 11:39 AM      Component Value Range Status Comment   MRSA by PCR NEGATIVE  NEGATIVE Final      Studies: Dg Pelvis Portable  03/25/2012  *RADIOLOGY REPORT*  Clinical Data: Postop right hip surgery.  PORTABLE PELVIS  Comparison: Bone survey 09/21/2011.  Findings: Interval placement of a right total hip arthroplasty with long stem femoral component and cerclage wire fixation of greater trochanteric fragments.  The femoral component bridges an osteotomy site.  The distal tip of  the femoral component is not included on the image.  There is subcutaneous emphysema and surgical drains consistent with recent surgery.  Degenerative changes in the left hip.  No focal bone lesion appreciated.  IMPRESSION: Postoperative changes with right total hip arthroplasty.  No apparent complication.  Incomplete visualization of the femoral component.   Original Report Authenticated By: Neale Burly, M.D.    Dg Hip Portable 1 View Right  03/25/2012  *RADIOLOGY REPORT*  Clinical Data: Postop right hip surgery.  PORTABLE RIGHT HIP - 1 VIEW  Comparison: None.  Findings: Lateral views of the right hip demonstrate right hip arthroplasty with long stem femoral component and cerclage wires. The distal portion of the femoral component is visualized but due to underpenetration, limited detail is available.  Skin clips and subcutaneous emphysema with surgical drains consistent with recent surgery.  IMPRESSION: Postoperative changes with right hip arthroplasty demonstrated.   Original Report Authenticated By: Neale Burly, M.D.     Scheduled Meds:    . calcium acetate  667 mg Oral TID WC  . Chlorhexidine Gluconate Cloth  6 each Topical Q0600  . darbepoetin  12.5 mcg Intravenous Q7 days  . docusate sodium  100 mg Oral BID  . feeding supplement  30 mL Oral BID  . ferric gluconate (FERRLECIT/NULECIT) IV  125 mg Intravenous Q14 Days  . metoprolol tartrate  12.5 mg Oral BID  . multivitamin  1 tablet Oral Daily  . mupirocin ointment  1 application Nasal BID  . nystatin   Topical BID  . polyethylene glycol  17 g Oral BID  . sertraline  50 mg Oral q morning - 10a   Continuous Infusions:   ________________________________________________________________________  Time spent: 20 min    [facility] Pager [phone] If 8PM-8AM, please contact night-coverage at [email or web address], password [facility] 03/28/2012, 6:26 PM  LOS: 3 days

## 2012-03-28 NOTE — Progress Notes (Signed)
Subjective:  No cos ,had hd earlier today tolerated, feels stronger since blood transfusion yesterday Objective Vital signs in last 24 hours: Filed Vitals:   03/28/12 1030 03/28/12 1100 03/28/12 1111 03/28/12 1138  BP: 119/67 120/65 122/72 119/76  Pulse: 99 105 101 105  Temp:    97.5 F (36.4 C)  TempSrc:    Oral  Resp: 22 19 22 20   Height:      Weight:   96.9 kg (213 lb 10 oz)   SpO2:   100% 99%   Weight change:   Intake/Output Summary (Last 24 hours) at 03/28/12 1238 Last data filed at 03/28/12 1111  Gross per 24 hour  Intake    520 ml  Output   1839 ml  Net  -1319 ml   Labs: Basic Metabolic Panel:  Lab 99991111 0727 03/26/12 0240 03/25/12 1430  NA 136 139 138  K 3.5 3.6 3.3*  CL 101 105 98  CO2 29 26 32  GLUCOSE 126* 143* 90  BUN 20 18 15   CREATININE 2.43* 2.70* 2.40*  CALCIUM 8.5 8.4 9.9  ALB -- -- --  PHOS -- -- --   Liver Function Tests:  Lab 03/26/12 0240  AST 21  ALT 15  ALKPHOS 95  BILITOT 0.4  PROT 5.2*  ALBUMIN 2.6*   No results found for this basename: LIPASE:3,AMYLASE:3 in the last 168 hours No results found for this basename: AMMONIA:3 in the last 168 hours CBC:  Lab 03/28/12 0727 03/27/12 0435 03/26/12 0240 03/25/12 1430  WBC 8.7 9.2 16.3* --  NEUTROABS -- -- -- --  HGB 9.2* 7.3* 9.7* --  HCT 28.7* 23.3* 30.6* --  MCV 83.7 82.3 81.2 81.5  PLT 124* 164 229 --   Cardiac Enzymes:  Lab 03/26/12 0240  CKTOTAL 374*  CKMB 2.8  CKMBINDEX --  TROPONINI <0.30   CBG:  Lab 03/25/12 2246  GLUCAP 133*    Iron Studies: No results found for this basename: IRON,TIBC,TRANSFERRIN,FERRITIN in the last 72 hours Studies/Results: No results found. Medications:      . calcium acetate  667 mg Oral TID WC  . Chlorhexidine Gluconate Cloth  6 each Topical Q0600  . darbepoetin  12.5 mcg Intravenous Q7 days  . docusate sodium  100 mg Oral BID  . feeding supplement  30 mL Oral BID  . ferric gluconate (FERRLECIT/NULECIT) IV  125 mg Intravenous Q14  Days  . influenza  inactive virus vaccine  0.5 mL Intramuscular Tomorrow-1000  . mupirocin ointment  1 application Nasal BID  . nystatin   Topical BID  . polyethylene glycol  17 g Oral BID  . sertraline  50 mg Oral q morning - 10a   I  have reviewed scheduled and prn medications.  Physical Exam: General: Alert, nad, appropriate Heart: RRR, no murmur or rub Lungs: CTA bilaterally  Abdomen: bs pos. Soft, nontender Extremities: Dialysis Access: no pedal edema, pos. Bruit  LUA AVF  Outpatient HD: [facility] MWF, 3hr 45 min, 96.5kg EDW, 2K/2.25Ca bath, LUA AVF. EPO 5000u, venofer 100 every other wk (next 03/26/12), Heparin 10,000u. No vit D. Last PTH 16.6  Impression  1. S/P reimplantation of R THA, POD #3 Dr. Alvan Dame  2. ESRD, cont hd mwf. HD today with post  Wt 96.9 getting closer to edw 96.5 kg with wts.?? acurate  no hep for now.  . BP stable today,. 4K bath due to low K 3. HTN- dilt 240 xl/d and metoprolol 50 bid at home, on hold  due to low bp 4. 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 Afib- BB/dilt and sotalol, no coumadin due to #6/ has IVC Filter 12/07/11 insert 6. Hx hemorrhagic CVA 7. Anemia-  hgb 9.2 after 2 u prbcs yesterday after 7.3 hgb and prior 9.7 on 03/26/12 cont epo, iv iron on hd    Ernest Haber, PA-C [place] 03/28/2012,12:38 PM  LOS: 3 days   Patient seen and examined and agree with assessment and plan as above. Feeling better after transfusion. No new problems. HD today.  Kelly Splinter  MD [employer] [phone] pgr    [phone] cell 03/28/2012, 3:15 PM

## 2012-03-28 NOTE — Evaluation (Signed)
Physical Therapy Evaluation Patient Details Name: [name] MRN: [id number] DOB: Oct 25, 1945 Today's Date: 03/28/2012 Time: [id number] PT Time Calculation (min): 25 min  PT Assessment / Plan / Recommendation Clinical Impression  Patient is a 66 yo female admitted for right total hip revision.  Patient with decreased strength, pain, and increased HR all impacting functional mobility.  Recommend patient return to SNF at discharge for continued therapy.  Patient will benefit from acute PT for strengthening, mobility training, and education    PT Assessment  Patient needs continued PT services    Follow Up Recommendations  Skilled nursing facility    Barriers to Discharge        Equipment Recommendations  None recommended by PT    Recommendations for Other Services     Frequency 7X/week    Precautions / Restrictions Precautions Precautions: Posterior Hip;Fall Precaution Booklet Issued: Yes (comment) Precaution Comments: Reviewed posterior hip precautions with patient. Restrictions Weight Bearing Restrictions: Yes RLE Weight Bearing: Partial weight bearing RLE Partial Weight Bearing Percentage or Pounds: 50         Mobility  Bed Mobility Bed Mobility: Supine to Sit;Sitting - Scoot to Edge of Bed Supine to Sit: 1: +2 Total assist;With rails;HOB flat Supine to Sit: Patient Percentage: 60% Sitting - Scoot to Edge of Bed: 3: Mod assist;With rail Details for Bed Mobility Assistance: Verbal cues for technique to maintain hip precautions.  Assist to move RLE, and to lift trunk off bed. Transfers Transfers: Sit to Stand;Stand to Sit;Stand Pivot Transfers Sit to Stand: 1: +2 Total assist;With upper extremity assist;From bed Sit to Stand: Patient Percentage: 60% Stand to Sit: 1: +2 Total assist;With upper extremity assist;With armrests;To chair/3-in-1 Stand to Sit: Patient Percentage: 60% Stand Pivot Transfers: 1: +2 Total assist Stand Pivot Transfers: Patient Percentage:  60% Details for Transfer Assistance: Verbal cues for hand placement and placement of RLE during transitions.  Verbal cues to maintain PWB on RLE at 50%.  Verbal cues for technique for transfer.  Patient able to take several steps to turn to chair.  Assist to control descent into chair.  Patient with increased HR (to 147) during transfer - returned to 106 following activity. Ambulation/Gait Ambulation/Gait Assistance: Not tested (comment)    Exercises     PT Diagnosis: Difficulty walking;Generalized weakness;Acute pain  PT Problem List: Decreased strength;Decreased activity tolerance;Decreased mobility;Decreased knowledge of use of DME;Decreased knowledge of precautions;Cardiopulmonary status limiting activity;Pain PT Treatment Interventions: DME instruction;Gait training;Functional mobility training;Therapeutic exercise;Patient/family education   PT Goals Acute Rehab PT Goals PT Goal Formulation: With patient Time For Goal Achievement: 04/11/12 Potential to Achieve Goals: Good Pt will go Supine/Side to Sit: with min assist;with HOB 0 degrees PT Goal: Supine/Side to Sit - Progress: Goal set today Pt will go Sit to Supine/Side: with min assist;with HOB 0 degrees PT Goal: Sit to Supine/Side - Progress: Goal set today Pt will go Sit to Stand: with supervision;with upper extremity assist PT Goal: Sit to Stand - Progress: Goal set today Pt will go Stand to Sit: with supervision;with upper extremity assist PT Goal: Stand to Sit - Progress: Goal set today Pt will Transfer Bed to Chair/Chair to Bed: with min assist PT Transfer Goal: Bed to Chair/Chair to Bed - Progress: Goal set today Pt will Ambulate: 51 - 150 feet;with min assist;with rolling walker PT Goal: Ambulate - Progress: Goal set today  Visit Information  Last PT Received On: 03/28/12 Assistance Needed: +2    Subjective Data  Subjective: "This has been going  on with my hip for a long time" Patient Stated Goal: To be able to walk    Prior [place] Lives With: Spouse;Daughter Available Help at Discharge: [place];Other (Comment) (Wants to go to [facility]) [place]: Wheelchair - manual;Walker - standard;Bedside commode/3-in-1;Tub transfer bench;Hospital bed;Other (comment) (sliding board) Additional Comments: Pt was in wheelchair at home and using slidingboard for transfers secondary to having antibiotic spacer. Prior Function Level of Independence: Needs assistance Needs Assistance: Bathing;Dressing Bath: Minimal Dressing: Minimal Gait Assistance: Was NWB RLE - used wheelchair Transfer Assistance: Min assist Able to Take Stairs?: No Driving: No Communication Communication: No difficulties    Cognition  Overall Cognitive Status: Appears within functional limits for tasks assessed/performed Arousal/Alertness: Awake/alert Orientation Level: Appears intact for tasks assessed Behavior During Session: [facility] for tasks performed    Extremity/Trunk Assessment Right Upper Extremity Assessment RUE ROM/Strength/Tone: [facility] for tasks assessed Left Upper Extremity Assessment LUE ROM/Strength/Tone: [facility] for tasks assessed Right Lower Extremity Assessment RLE ROM/Strength/Tone: Deficits;Unable to fully assess;Due to pain RLE ROM/Strength/Tone Deficits: Decreased strength 3-/5 RLE Sensation: WFL - Light Touch Left Lower Extremity Assessment LLE ROM/Strength/Tone: WFL for tasks assessed LLE Sensation: WFL - Light Touch   Balance Balance Balance Assessed: Yes Static Sitting Balance Static Sitting - Balance Support: Bilateral upper extremity supported;Feet supported Static Sitting - Level of Assistance: 5: Stand by assistance Static Sitting - Comment/# of Minutes: Patient able to maintain sitting balance once in upright position.  End of Session PT - End of Session Equipment Utilized During Treatment: Gait belt Activity Tolerance: Patient limited by fatigue;Treatment limited  secondary to medical complications (Comment) (Increased HR during session) Patient left: in chair;with call bell/phone within reach Nurse Communication: Mobility status  GP     Despina Pole 03/28/2012, 2:46 PM Carita Pian. Sanjuana Kava, [place] Pager [phone]

## 2012-03-28 NOTE — Evaluation (Signed)
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  Now presents for new RTHR via Dr. Alvan Dame.  Demonstrates decreased overall independence with basic selfcare tasks including bathing, dressing, and toileting.  Feel she will benefit from acute care OT to help begin rehab and progressing toward independent level.  Feel pt will need SNF for follow-up therapy.  She reports that she will be going to [facility].    OT Assessment  Patient needs continued OT Services    Follow Up Recommendations       Barriers to Discharge      Equipment Recommendations  None recommended by OT    Recommendations for Other Services    Frequency  Min 2X/week    Precautions / Restrictions Precautions Precautions: Posterior Hip;Fall;Other (comment) (MRSA) Precaution Booklet Issued: Yes (comment) Precaution Comments: Reviewed posterior hip precautions with patient. Restrictions Weight Bearing Restrictions: Yes RLE Weight Bearing: Partial weight bearing RLE Partial Weight Bearing Percentage or Pounds: 50   Pertinent Vitals/Pain Pain 2/4 on the faces scale, meds requested at beginning of session and brought by nursing.  O2 sats 98%, HR 125    ADL  Eating/Feeding: Performed;Independent Where Assessed - Eating/Feeding: Bed level Grooming: Simulated;Set up Where Assessed - Grooming: Unsupported sitting Upper Body Bathing: Simulated;Set up Where Assessed - Upper Body Bathing: Unsupported sitting Lower Body Bathing: Simulated;+2 Total assistance Lower Body Bathing: Patient Percentage: 30% Where Assessed - Lower Body Bathing: Supported sit to stand Upper Body Dressing: Simulated;Set up Where Assessed - Upper Body Dressing:  Unsupported sitting Lower Body Dressing: Simulated;+2 Total assistance Lower Body Dressing: Patient Percentage: 30% Where Assessed - Lower Body Dressing: Supported sit to stand Toilet Transfer: Performed;+2 Total assistance Toilet Transfer: Patient Percentage: 30% Armed forces technical officer Method: Arts development officer: Therapist, occupational and Hygiene: Performed;+2 Total assistance Toileting - Water quality scientist and Hygiene: Patient Percentage: 30% Where Assessed - Best boy and Hygiene: Sit to stand from 3-in-1 or toilet Equipment Used: Rolling walker Transfers/Ambulation Related to ADLs: Pt requires total +2 (pt 30%) for transfer to bedside commode and back to bed using RW. ADL Comments: Pt able to state 2/3 posterior precautions.  Needs total + 2 assistance for sit to stand and pivot transfers.  Reports having reacher, sock aide, LH shoe horn and LH sponge for use at home.  Plans to discharge to SNF for follow-up rehab.    OT Diagnosis: Generalized weakness;Acute pain  OT Problem List: Decreased strength;Decreased activity tolerance;Impaired balance (sitting and/or standing);Decreased knowledge of use of DME or AE;Decreased knowledge of precautions;Pain OT Treatment Interventions: Self-care/ADL training;DME and/or AE instruction;Therapeutic activities;Patient/family education;Balance training   OT Goals Acute Rehab OT Goals OT Goal Formulation: With patient Time For Goal Achievement: 04/11/12 Potential to Achieve Goals: Good ADL Goals Pt Will Perform Grooming: with mod assist;Other (comment) (2 tasks standing at the sink) ADL Goal: Grooming - Progress: Goal set today Pt Will Perform Lower Body Bathing: with mod assist;Sit to stand from chair;with adaptive equipment ADL Goal: Lower Body Bathing - Progress: Goal set today Pt Will Perform Lower Body Dressing: with mod assist;Sit to stand from chair;Sit to stand from bed ADL  Goal: Lower Body Dressing - Progress: Goal set today Pt Will Transfer to Toilet: with mod assist;with DME;Stand  pivot transfer;Maintaining hip precautions;Maintaining weight bearing status ADL Goal: Toilet Transfer - Progress: Goal set today Pt Will Perform Toileting - Clothing Manipulation: Sitting on 3-in-1 or toilet;Standing ADL Goal: Toileting - Clothing Manipulation - Progress: Goal set today Pt Will Perform Toileting - Hygiene: with mod assist;Sit to stand from 3-in-1/toilet ADL Goal: Toileting - Hygiene - Progress: Goal set today Miscellaneous OT Goals Miscellaneous OT Goal #1: Pt will perform bilateral UE light therex  independence.to increase endurance and strength for greater independence with selfcare tasks.  Visit Information  Last OT Received On: 03/28/12 Assistance Needed: +2    Subjective Data  Subjective: "I can't sit up anymore today, maybe tomorrow." Patient Stated Goal: Get to [facility] and get back to being independent.   Prior Functioning  Vision/Perception  Home Living Lives With: Spouse;Daughter Available Help at Discharge: [place];Other (Comment) (Wants to go to [facility]) [place]: Wheelchair - manual;Walker - standard;Bedside commode/3-in-1;Tub transfer bench;Hospital bed;Other (comment) (sliding board) Additional Comments: Pt was in wheelchair at home and using slidingboard for transfers secondary to having antibiotic spacer. Prior Function Level of Independence: Needs assistance Needs Assistance: Bathing;Dressing Bath: Minimal Dressing: Minimal Gait Assistance: Was NWB RLE - used wheelchair Transfer Assistance: Min assist Able to Take Stairs?: No Driving: No Communication Communication: No difficulties   Vision - Assessment Vision Assessment: Vision not tested Perception Perception: Within Functional Limits Praxis Praxis: Intact  Cognition  Overall Cognitive Status: Appears within functional limits for  tasks assessed/performed Arousal/Alertness: Awake/alert Orientation Level: Appears intact for tasks assessed Behavior During Session: [facility] for tasks performed    Extremity/Trunk Assessment Right Upper Extremity Assessment RUE ROM/Strength/Tone: Deficits RUE ROM/Strength/Tone Deficits: shoulder strength 3+/5, biceps and triceps 4/5 as well as grip RUE Sensation: WFL - Light Touch RUE Coordination: WFL - gross/fine motor Left Upper Extremity Assessment LUE ROM/Strength/Tone: Deficits LUE ROM/Strength/Tone Deficits: Shoulder strength 4/5 all other joints WFLS LUE Sensation: WFL - Light Touch LUE Coordination: WFL - gross/fine motor Right Lower Extremity Assessment RLE ROM/Strength/Tone: Deficits;Unable to fully assess;Due to pain RLE ROM/Strength/Tone Deficits: Decreased strength 3-/5 RLE Sensation: WFL - Light Touch Left Lower Extremity Assessment LLE ROM/Strength/Tone: WFL for tasks assessed LLE Sensation: WFL - Light Touch Trunk Assessment Trunk Assessment: Other exceptions Trunk Exceptions: Pt exhibit forward trunk flexion in standing with the RW.   Mobility  Shoulder Instructions  Bed Mobility Bed Mobility: Sit to Supine Supine to Sit: 1: +2 Total assist;With rails;HOB flat Supine to Sit: Patient Percentage: 60% Sitting - Scoot to Edge of Bed: 3: Mod assist;With rail Sit to Supine: 2: Max assist Details for Bed Mobility Assistance: Pt needed assistance for lifting LEs into the bed. Transfers Sit to Stand: 1: +2 Total assist;With upper extremity assist;From bed Sit to Stand: Patient Percentage: 60% Stand to Sit: 1: +2 Total assist;With upper extremity assist;With armrests;To chair/3-in-1 Stand to Sit: Patient Percentage: 60% Details for Transfer Assistance: Verbal cues for hand placement and placement of RLE during transitions.  Verbal cues to maintain PWB on RLE at 50%.  Verbal cues for technique for transfer.  Patient able to take several steps to turn to chair.  Assist to  control descent into chair.  Patient with increased HR (to 147) during transfer - returned to 106 following activity.          Balance Balance Balance Assessed: Yes Static Sitting Balance Static Sitting - Balance Support: Bilateral upper extremity supported;Feet supported Static Sitting - Level of Assistance: 5: Stand by assistance Static Sitting -  Comment/# of Minutes: Patient able to maintain sitting balance once in upright position. Static Standing Balance Static Standing - Balance Support: Right upper extremity supported;Left upper extremity supported Static Standing - Level of Assistance: 3: Mod assist   End of Session OT - End of Session Equipment Utilized During Treatment: Gait belt Activity Tolerance: Patient limited by fatigue;Patient limited by pain Patient left: in bed;with call bell/phone within reach;with family/visitor present Nurse Communication: Mobility status     [place] OTR/L Pager number [phone] 03/28/2012, 3:08 PM

## 2012-03-29 LAB — CBC
HCT: 26.8 % — ABNORMAL LOW (ref 36.0–46.0)
Hemoglobin: 8.5 g/dL — ABNORMAL LOW (ref 12.0–15.0)
MCH: 26.8 pg (ref 26.0–34.0)
MCHC: 31.7 g/dL (ref 30.0–36.0)
MCV: 84.5 fL (ref 78.0–100.0)
Platelets: 155 10*3/uL (ref 150–400)
RBC: 3.17 MIL/uL — ABNORMAL LOW (ref 3.87–5.11)
RDW: 18.8 % — ABNORMAL HIGH (ref 11.5–15.5)
WBC: 8.3 10*3/uL (ref 4.0–10.5)

## 2012-03-29 NOTE — Progress Notes (Signed)
Physical Therapy Treatment Patient Details Name: [name] MRN: [id number] DOB: Jul 24, 1945 Today's Date: 03/29/2012 Time: [id number] PT Time Calculation (min): 12 min  PT Assessment / Plan / Recommendation Comments on Treatment Session  Improvement with mobility and gait today.  Patient able to take steps and maintain PWB status.    Follow Up Recommendations  Skilled nursing facility    Barriers to Discharge        Equipment Recommendations  None recommended by PT    Recommendations for Other Services    Frequency 7X/week   Plan Discharge plan remains appropriate;Frequency remains appropriate    Precautions / Restrictions Precautions Precautions: Posterior Hip;Fall Precaution Comments: Patient able to recall 3/3 hip precautions Restrictions Weight Bearing Restrictions: Yes RLE Weight Bearing: Partial weight bearing RLE Partial Weight Bearing Percentage or Pounds: 50   Pertinent Vitals/Pain Pain limiting session today.    Mobility  Bed Mobility Bed Mobility: Supine to Sit;Sitting - Scoot to Edge of Bed Supine to Sit: 3: Mod assist;HOB flat;With rails Sitting - Scoot to Edge of Bed: 3: Mod assist;With rail Details for Bed Mobility Assistance: Verbal cues for technique.  Assist to bring RLE off bed.  Used bed pad to assist patient to scoot to EOB. Transfers Transfers: Sit to Stand;Stand to Sit Sit to Stand: 1: +2 Total assist;With upper extremity assist;From bed Sit to Stand: Patient Percentage: 70% Stand to Sit: 3: Mod assist;With upper extremity assist;With armrests;To chair/3-in-1 Details for Transfer Assistance: Verbal cues for hand placement and placement of RLE during transitions.  Verbal cues for PWB status.  Patient able to scoot self back in chair independently. Ambulation/Gait Ambulation/Gait Assistance: 1: +2 Total assist Ambulation/Gait: Patient Percentage: 60% Ambulation Distance (Feet): 5 Feet Assistive device: Rolling walker Ambulation/Gait Assistance  Details: Verbal cues for gait sequence and safe use of RW.  Patient able to use UE's to unweight LLE to take steps. Gait Pattern: Step-to pattern;Decreased stance time - right;Antalgic;Trunk flexed Gait velocity: Decreased      PT Goals Acute Rehab PT Goals PT Goal: Supine/Side to Sit - Progress: Progressing toward goal PT Goal: Sit to Stand - Progress: Progressing toward goal PT Goal: Stand to Sit - Progress: Progressing toward goal PT Transfer Goal: Bed to Chair/Chair to Bed - Progress: Progressing toward goal PT Goal: Ambulate - Progress: Progressing toward goal  Visit Information  Last PT Received On: 03/29/12 Assistance Needed: +2    Subjective Data  Subjective: "I got a good night sleep last night."   Cognition  Overall Cognitive Status: Appears within functional limits for tasks assessed/performed Arousal/Alertness: Awake/alert Orientation Level: Appears intact for tasks assessed Behavior During Session: [facility] for tasks performed    Balance     End of Session PT - End of Session Equipment Utilized During Treatment: Gait belt Activity Tolerance: Patient limited by fatigue;Patient limited by pain Patient left: in chair;with call bell/phone within reach Nurse Communication: Mobility status   GP     Despina Pole 03/29/2012, 10:51 AM Carita Pian. Sanjuana Kava, [place] Pager [phone]

## 2012-03-29 NOTE — Progress Notes (Signed)
Patient ID: [name], female   DOB: [birth year], 66 y.o.   MRN: [id number]   This is a progress note reflecting a visit with [name] on Thursday am while she was in the dialysis unit.  She appears to be doing well, smiling.  Right hip wound dry for now, no drainage.  Plan: PWB LRE with therapy Will try to get her to be able to go home with HHPT

## 2012-03-29 NOTE — Progress Notes (Signed)
Subjective: 4 Days Post-Op Procedure(s) (LRB): TOTAL HIP REVISION (Right) Patient reports pain as mild.   Patient seen in rounds with Dr. Wynelle Link. Sitting up in chair this morning. Patient is well, and has had no acute complaints or problems Plan is to go Skilled nursing facility after hospital stay.  Objective: Vital signs in last 24 hours: Temp:  [97.5 F (36.4 C)-99.6 F (37.6 C)] 98.4 F (36.9 C) (09/14 0753) Pulse Rate:  [95-147] 99  (09/14 0754) Resp:  [18-23] 20  (09/14 0754) BP: (99-132)/(43-80) 123/70 mmHg (09/14 0754) SpO2:  [95 %-100 %] 97 % (09/14 0754) Weight:  [96.9 kg (213 lb 10 oz)-98.8 kg (217 lb 13 oz)] 98.8 kg (217 lb 13 oz) (09/14 0616)  Intake/Output from previous day:  Intake/Output Summary (Last 24 hours) at 03/29/12 0851 Last data filed at 03/29/12 0500  Gross per 24 hour  Intake    120 ml  Output   1109 ml  Net   -989 ml    Intake/Output this shift:    Labs:  Basename 03/29/12 0525 03/28/12 0727 03/27/12 0435  HGB 8.5* 9.2* 7.3*    Basename 03/29/12 0525 03/28/12 0727  WBC 8.3 8.7  RBC 3.17* 3.43*  HCT 26.8* 28.7*  PLT 155 124*    Basename 03/28/12 0727  NA 136  K 3.5  CL 101  CO2 29  BUN 20  CREATININE 2.43*  GLUCOSE 126*  CALCIUM 8.5   No results found for this basename: LABPT:2,INR:2 in the last 72 hours  EXAM General - Patient is Alert, Appropriate and Oriented Extremity - Neurovascular intact Sensation intact distally Dorsiflexion/Plantar flexion intact Dressing/Incision - clean, dry, no drainage, healing Motor Function - intact, moving foot and toes well on exam.   Past Medical History  Diagnosis Date  . Fractures, stress     in both feet--6 OR 7 YRS AGO--RESOLVED  . 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 Gout     doesn't require meds   . Anemia   . Blood transfusion     never had a reaction to blood transfusion  . Cardiomyopathy Mar 2013    Mild, EF 50-55% by Mar 2013 ECHO, diast dysfxn II  .  Hypercalcemia     09/10/11  . HCAP (healthcare-associated pneumonia)     09/10/11  . Hypertension     takes Diltiazem daily   . Asthma     as a child  . Eczema   . Constipation     takes Miralax daily  . Oligouria   . Depression     takes Zoloft daily  . Stroke 2009    denies residual  . Peripheral vascular disease   . DVT of lower extremity, bilateral 12/21/11    "they're there now; been there for 2 wks"  . Atrial fibrillation      HX OF CEREBRAL BLEED WHILE ON COUMADIN-SO PT NOT ON ANY BLOOD THINNERS NOW  . Arrhythmia     takes Metoprolol daily  . Sleep apnea     HAS CPAP-sleep study done 2007--PT NOT USING AT PRESENT  . ESRD (end stage renal disease) on dialysis 09/2011    Started dialysis Mar 2013 on M/W/F on [place]  . Arthritis     HIP    Assessment/Plan: 4 Days Post-Op Procedure(s) (LRB): TOTAL HIP REVISION (Right) Principal Problem:  *S/P right TH revision Active Problems:  Expected blood loss anemia  Hypotension   Up with therapy Discharge to SNF  DVT Prophylaxis -  Aspirin 50% PWB right leg  PERKINS, ALEXZANDREW 03/29/2012, 8:51 AM

## 2012-03-29 NOTE — Progress Notes (Signed)
Subjective:  Walked in the halls with assistance Objective Vital signs in last 24 hours: Filed Vitals:   03/29/12 0753 03/29/12 0754 03/29/12 0808 03/29/12 1105  BP:  123/70  120/84  Pulse:  99 115 99  Temp: 98.4 F (36.9 C)   97.9 F (36.6 C)  TempSrc: Oral   Oral  Resp:  20  19  Height:      Weight:      SpO2:  97% 92% 100%   Weight change: 0.2 kg (7.1 oz)  Intake/Output Summary (Last 24 hours) at 03/29/12 1443 Last data filed at 03/29/12 1300  Gross per 24 hour  Intake    720 ml  Output    100 ml  Net    620 ml   Labs: Basic Metabolic Panel:  Lab [id number] 0727 03/26/12 0240 03/25/12 1430  NA 136 139 138  K 3.5 3.6 3.3*  CL 101 105 98  CO2 29 26 32  GLUCOSE 126* 143* 90  BUN 20 18 15   CREATININE 2.43* 2.70* 2.40*  CALCIUM 8.5 8.4 9.9  ALB -- -- --  PHOS -- -- --   Liver Function Tests:  Lab 03/26/12 0240  AST 21  ALT 15  ALKPHOS 95  BILITOT 0.4  PROT 5.2*  ALBUMIN 2.6*   No results found for this basename: LIPASE:3,AMYLASE:3 in the last 168 hours No results found for this basename: AMMONIA:3 in the last 168 hours CBC:  Lab 03/29/12 0525 03/28/12 0727 03/27/12 0435 03/26/12 0240 03/25/12 1430  WBC 8.3 8.7 9.2 -- --  NEUTROABS -- -- -- -- --  HGB 8.5* 9.2* 7.3* -- --  HCT 26.8* 28.7* 23.3* -- --  MCV 84.5 83.7 82.3 81.2 81.5  PLT 155 124* 164 -- --   Cardiac Enzymes:  Lab 03/26/12 0240  CKTOTAL 374*  CKMB 2.8  CKMBINDEX --  TROPONINI <0.30   CBG:  Lab 03/25/12 2246  GLUCAP 133*    Iron Studies: No results found for this basename: IRON,TIBC,TRANSFERRIN,FERRITIN in the last 72 hours Studies/Results: No results found. Medications:      . calcium acetate  667 mg Oral TID WC  . Chlorhexidine Gluconate Cloth  6 each Topical Q0600  . darbepoetin  12.5 mcg Intravenous Q7 days  . docusate sodium  100 mg Oral BID  . feeding supplement  30 mL Oral BID  . ferric gluconate (FERRLECIT/NULECIT) IV  125 mg Intravenous Q14 Days  . metoprolol  tartrate  12.5 mg Oral BID  . multivitamin  1 tablet Oral Daily  . mupirocin ointment  1 application Nasal BID  . nystatin   Topical BID  . polyethylene glycol  17 g Oral BID  . sertraline  50 mg Oral q morning - 10a   I  have reviewed scheduled and prn medications.  Physical Exam: General: Alert, nad, appropriate Heart: RRR, no murmur or rub Lungs: CTA bilaterally  Abdomen: bs pos. Soft, nontender Extremities: Dialysis Access: no pedal edema, pos. Bruit  LUA AVF  Outpatient HD: [facility] MWF, 3hr 45 min, 96.5kg EDW, 2K/2.25Ca bath, LUA AVF. EPO 5000u, venofer 100 every other wk (next 03/26/12), Heparin 10,000u. No vit D. Last PTH 16.6  Impression  1. S/P reimplantation of R THA, POD #4 Dr. Alvan Dame  2. ESRD, mwf, will reweigh on standing scale. HD monday 3. HTN- dilt 240 xl/d and metoprolol 50 bid at home, on hold due to low bp 4. 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 Afib- BB/dilt and  sotalol, no coumadin due to #6/ has IVC Filter 12/07/11 insert 6. Hx hemorrhagic CVA 7. Anemia-  hgb 9.2 after 2 u prbcs 9/12 after 7.3 hgb and prior 9.7 on 03/26/12 cont epo, iv iron on hd   Kelly Splinter  MD [facility] [phone] pgr    [phone] cell 03/29/2012, 2:43 PM

## 2012-03-30 DIAGNOSIS — I4891 Unspecified atrial fibrillation: Secondary | ICD-10-CM

## 2012-03-30 DIAGNOSIS — J45909 Unspecified asthma, uncomplicated: Secondary | ICD-10-CM

## 2012-03-30 LAB — BASIC METABOLIC PANEL
BUN: 28 mg/dL — ABNORMAL HIGH (ref 6–23)
CO2: 27 mEq/L (ref 19–32)
Calcium: 9.4 mg/dL (ref 8.4–10.5)
Chloride: 100 mEq/L (ref 96–112)
Creatinine, Ser: 2.7 mg/dL — ABNORMAL HIGH (ref 0.50–1.10)
GFR calc Af Amer: 20 mL/min — ABNORMAL LOW (ref >90)
GFR calc non Af Amer: 18 mL/min — ABNORMAL LOW (ref >90)
Glucose, Bld: 114 mg/dL — ABNORMAL HIGH (ref 70–99)
Potassium: 4 mEq/L (ref 3.5–5.1)
Sodium: 136 mEq/L (ref 135–145)

## 2012-03-30 LAB — CBC
HCT: 29 % — ABNORMAL LOW (ref 36.0–46.0)
Hemoglobin: 9.1 g/dL — ABNORMAL LOW (ref 12.0–15.0)
MCH: 26.7 pg (ref 26.0–34.0)
MCHC: 31.4 g/dL (ref 30.0–36.0)
MCV: 85 fL (ref 78.0–100.0)
Platelets: 205 10*3/uL (ref 150–400)
RBC: 3.41 MIL/uL — ABNORMAL LOW (ref 3.87–5.11)
RDW: 18.4 % — ABNORMAL HIGH (ref 11.5–15.5)
WBC: 9 10*3/uL (ref 4.0–10.5)

## 2012-03-30 MED ORDER — HEPARIN SODIUM (PORCINE) 1000 UNIT/ML DIALYSIS
20.0000 [IU]/kg | INTRAMUSCULAR | Status: DC | PRN
  Administered 2012-03-31: 2000 [IU] via INTRAVENOUS_CENTRAL
  Filled 2012-03-30: qty 2

## 2012-03-30 NOTE — Consult Note (Signed)
[facility] PROGRESS NOTE  [name] [id number] DOB: February 21, 1946 DOA: 03/25/2012 PCP: Garnet Koyanagi, DO  Assessment/Plan: Principal Problem: Post-op hypotension likely related to a combination of acute blood loss and narcotics- it is also noted that her weight was only 86 Kg on 9/11 and is now 96 kg, - not sure of the accuracy of these weights-  Question if dehydration also played a part in her hypotension- now improved -low dose Metoprolol resumed- HR and BP stable- hold off on resuming remaining antihypertensives unless BP or HR uncontrolled.   Tachycardia with a history of A-fib Likely due to acute blood loss and withdrawal of Beta Blockers and Cardizem- will be resumed low dose of Metoprolol today while watching BP closely.  H/o cerebral hemorrhage on Coumadin and therefore not currently on anticoagulation   S/P right TH revision On 9/11- management per ortho   Expected blood loss anemia Transfuse if Hgb < 8. S/p 2 U PRBC on 9/12- Hgb stable thus far  ESRD on HD Dialysis per nephrology  Grade 2 diastolic dysfunction Resumed B blockers    Code Status: full code Family Communication: discussed with patient  Disposition Plan: per Ortho DVT prophylaxis: per ortho   Brief narrative: The patient is a 66 y.o. year-old w hx of HTN, afib, ESRD, hemorrhagic CVA (off coumdin now) and asthma. Patient had R right THA 02/2011, had fracture and infection of joint requiring I&D/removal of hardware/antiobiotic spacer placed 09/2011. Then had repeat I&D June 2013 and now was admitted for reimplantation R THA which was done yesterday at [place] op has had some low BP's, Hb drop from 12.3 to 9.7 and [facility] service was consulted.   HPI/Subjective: Sitting up in chair- no complaints.    Objective: Filed Vitals:   03/30/12 1025 03/30/12 1301 03/30/12 1937 03/30/12 2047  BP: 124/79 119/75 110/66 120/76  Pulse: 98 93 103 106  Temp: 97.4 F (36.3 C) 97.8 F (36.6  C) 98.7 F (37.1 C) 98 F (36.7 C)  TempSrc: Oral Oral Oral Oral  Resp: 17 18 18 18   Height:      Weight:    99.791 kg (220 lb)  SpO2: 93% 94% 96% 96%    Intake/Output Summary (Last 24 hours) at 03/30/12 2140 Last data filed at 03/30/12 1230  Gross per 24 hour  Intake    240 ml  Output      0 ml  Net    240 ml    Exam:   General:  Alert, no distress- attempting to mobilize to bedside chair with PT  Cardiovascular: RRR, no murmurs, mild tachycardia  Respiratory: CTA b/l   Abdomen: soft, ND, ND, BS+  Ext:no c/c/e  Data Reviewed: Basic Metabolic Panel:  Lab [id number] 03/28/12 0727 03/26/12 0240 03/25/12 1430  NA 136 136 139 138  K 4.0 3.5 3.6 3.3*  CL 100 101 105 98  CO2 27 29 26  32  GLUCOSE 114* 126* 143* 90  BUN 28* 20 18 15   CREATININE 2.70* 2.43* 2.70* 2.40*  CALCIUM 9.4 8.5 8.4 9.9  MG -- -- -- --  PHOS -- -- -- --   Liver Function Tests:  Lab 03/26/12 0240  AST 21  ALT 15  ALKPHOS 95  BILITOT 0.4  PROT 5.2*  ALBUMIN 2.6*   No results found for this basename: LIPASE:5,AMYLASE:5 in the last 168 hours No results found for this basename: AMMONIA:5 in the last 168 hours CBC:  Lab 03/30/12 1345 03/29/12 0525 03/28/12  [id number] 03/27/12 0435 03/26/12 0240  WBC 9.0 8.3 8.7 9.2 16.3*  NEUTROABS -- -- -- -- --  HGB 9.1* 8.5* 9.2* 7.3* 9.7*  HCT 29.0* 26.8* 28.7* 23.3* 30.6*  MCV 85.0 84.5 83.7 82.3 81.2  PLT 205 155 124* 164 229   Cardiac Enzymes:  Lab 03/26/12 0240  CKTOTAL 374*  CKMB 2.8  CKMBINDEX --  TROPONINI <0.30   BNP (last 3 results) No results found for this basename: PROBNP:3 in the last 8760 hours CBG:  Lab 03/25/12 2246  GLUCAP 133*    Recent Results (from the past 240 hour(s))  MRSA PCR SCREENING     Status: Normal   Collection Time   03/26/12 11:39 AM      Component Value Range Status Comment   MRSA by PCR NEGATIVE  NEGATIVE Final      Studies: Dg Pelvis Portable  03/25/2012  *RADIOLOGY REPORT*  Clinical Data: Postop  right hip surgery.  PORTABLE PELVIS  Comparison: Bone survey 09/21/2011.  Findings: Interval placement of a right total hip arthroplasty with long stem femoral component and cerclage wire fixation of greater trochanteric fragments.  The femoral component bridges an osteotomy site.  The distal tip of the femoral component is not included on the image.  There is subcutaneous emphysema and surgical drains consistent with recent surgery.  Degenerative changes in the left hip.  No focal bone lesion appreciated.  IMPRESSION: Postoperative changes with right total hip arthroplasty.  No apparent complication.  Incomplete visualization of the femoral component.   Original Report Authenticated By: Neale Burly, M.D.    Dg Hip Portable 1 View Right  03/25/2012  *RADIOLOGY REPORT*  Clinical Data: Postop right hip surgery.  PORTABLE RIGHT HIP - 1 VIEW  Comparison: None.  Findings: Lateral views of the right hip demonstrate right hip arthroplasty with long stem femoral component and cerclage wires. The distal portion of the femoral component is visualized but due to underpenetration, limited detail is available.  Skin clips and subcutaneous emphysema with surgical drains consistent with recent surgery.  IMPRESSION: Postoperative changes with right hip arthroplasty demonstrated.   Original Report Authenticated By: Neale Burly, M.D.     Scheduled Meds:    . calcium acetate  667 mg Oral TID WC  . Chlorhexidine Gluconate Cloth  6 each Topical Q0600  . darbepoetin  12.5 mcg Intravenous Q7 days  . docusate sodium  100 mg Oral BID  . feeding supplement  30 mL Oral BID  . ferric gluconate (FERRLECIT/NULECIT) IV  125 mg Intravenous Q14 Days  . metoprolol tartrate  12.5 mg Oral BID  . multivitamin  1 tablet Oral Daily  . mupirocin ointment  1 application Nasal BID  . nystatin   Topical BID  . polyethylene glycol  17 g Oral BID  . sertraline  50 mg Oral q morning - 10a   Continuous Infusions:    ________________________________________________________________________  Time spent: 20 min    [facility] Pager [phone] If 8PM-8AM, please contact night-coverage at [email or web address], password [email or web address] 03/30/2012, 9:40 PM  LOS: 5 days

## 2012-03-30 NOTE — Progress Notes (Signed)
   Subjective: 5 Days Post-Op Procedure(s) (LRB): TOTAL HIP REVISION (Right) Patient reports pain as mild.   Patient seen in rounds with Dr. Wynelle Link. She is doing better today. Patient is well, and has had no acute complaints or problems Plan is to go Skilled nursing facility after hospital stay.  Objective: Vital signs in last 24 hours: Temp:  [97.9 F (36.6 C)-98.7 F (37.1 C)] 98.2 F (36.8 C) (09/15 0535) Pulse Rate:  [91-102] 93  (09/15 0535) Resp:  [18-19] 18  (09/15 0535) BP: (109-128)/(64-84) 128/76 mmHg (09/15 0535) SpO2:  [93 %-100 %] 95 % (09/15 0535)  Intake/Output from previous day:  Intake/Output Summary (Last 24 hours) at 03/30/12 0945 Last data filed at 03/29/12 1300  Gross per 24 hour  Intake    240 ml  Output      0 ml  Net    240 ml    Intake/Output this shift:    Labs:  Basename 03/29/12 0525 03/28/12 0727  HGB 8.5* 9.2*    Basename 03/29/12 0525 03/28/12 0727  WBC 8.3 8.7  RBC 3.17* 3.43*  HCT 26.8* 28.7*  PLT 155 124*    Basename 03/28/12 0727  NA 136  K 3.5  CL 101  CO2 29  BUN 20  CREATININE 2.43*  GLUCOSE 126*  CALCIUM 8.5   No results found for this basename: LABPT:2,INR:2 in the last 72 hours  EXAM General - Patient is Alert, Appropriate and Oriented Extremity - Neurovascular intact Sensation intact distally Dorsiflexion/Plantar flexion intact No cellulitis present Dressing/Incision - clean, dry, no drainage, healing Motor Function - intact, moving foot and toes well on exam.   Past Medical History  Diagnosis Date  . Fractures, stress     in both feet--6 OR 7 YRS AGO--RESOLVED  . 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 Gout     doesn't require meds   . Anemia   . Blood transfusion     never had a reaction to blood transfusion  . Cardiomyopathy Mar 2013    Mild, EF 50-55% by Mar 2013 ECHO, diast dysfxn II  . Hypercalcemia     09/10/11  . HCAP (healthcare-associated pneumonia)     09/10/11  . Hypertension      takes Diltiazem daily   . Asthma     as a child  . Eczema   . Constipation     takes Miralax daily  . Oligouria   . Depression     takes Zoloft daily  . Stroke 2009    denies residual  . Peripheral vascular disease   . DVT of lower extremity, bilateral 12/21/11    "they're there now; been there for 2 wks"  . Atrial fibrillation      HX OF CEREBRAL BLEED WHILE ON COUMADIN-SO PT NOT ON ANY BLOOD THINNERS NOW  . Arrhythmia     takes Metoprolol daily  . Sleep apnea     HAS CPAP-sleep study done 2007--PT NOT USING AT PRESENT  . ESRD (end stage renal disease) on dialysis 09/2011    Started dialysis Mar 2013 on M/W/F on [place]  . Arthritis     HIP    Assessment/Plan: 5 Days Post-Op Procedure(s) (LRB): TOTAL HIP REVISION (Right) Principal Problem:  *S/P right TH revision Active Problems:  Expected blood loss anemia  Hypotension   Up with therapy Discharge to SNF  DVT Prophylaxis - Aspirin 50% PWB right leg   Tyteanna Ost 03/30/2012, 9:45 AM

## 2012-03-30 NOTE — Progress Notes (Signed)
Subjective:  Walking w assistance, no complaints Objective Vital signs in last 24 hours: Filed Vitals:   03/29/12 2030 03/30/12 0535 03/30/12 1025 03/30/12 1301  BP: 109/71 128/76 124/79 119/75  Pulse: 102 93 98 93  Temp: 98.7 F (37.1 C) 98.2 F (36.8 C) 97.4 F (36.3 C) 97.8 F (36.6 C)  TempSrc: Oral Oral Oral Oral  Resp: 18 18 17 18   Height:      Weight:      SpO2: 93% 95% 93% 94%   Weight change:   Intake/Output Summary (Last 24 hours) at 03/30/12 1326 Last data filed at 03/30/12 1230  Gross per 24 hour  Intake    240 ml  Output      0 ml  Net    240 ml   Labs: Basic Metabolic Panel:  Lab [id number] 0727 03/26/12 0240 03/25/12 1430  NA 136 139 138  K 3.5 3.6 3.3*  CL 101 105 98  CO2 29 26 32  GLUCOSE 126* 143* 90  BUN 20 18 15   CREATININE 2.43* 2.70* 2.40*  CALCIUM 8.5 8.4 9.9  ALB -- -- --  PHOS -- -- --   Liver Function Tests:  Lab 03/26/12 0240  AST 21  ALT 15  ALKPHOS 95  BILITOT 0.4  PROT 5.2*  ALBUMIN 2.6*   No results found for this basename: LIPASE:3,AMYLASE:3 in the last 168 hours No results found for this basename: AMMONIA:3 in the last 168 hours CBC:  Lab 03/29/12 0525 03/28/12 0727 03/27/12 0435 03/26/12 0240 03/25/12 1430  WBC 8.3 8.7 9.2 -- --  NEUTROABS -- -- -- -- --  HGB 8.5* 9.2* 7.3* -- --  HCT 26.8* 28.7* 23.3* -- --  MCV 84.5 83.7 82.3 81.2 81.5  PLT 155 124* 164 -- --   Cardiac Enzymes:  Lab 03/26/12 0240  CKTOTAL 374*  CKMB 2.8  CKMBINDEX --  TROPONINI <0.30   CBG:  Lab 03/25/12 2246  GLUCAP 133*    Iron Studies: No results found for this basename: IRON,TIBC,TRANSFERRIN,FERRITIN in the last 72 hours Studies/Results: No results found. Medications:      . calcium acetate  667 mg Oral TID WC  . Chlorhexidine Gluconate Cloth  6 each Topical Q0600  . darbepoetin  12.5 mcg Intravenous Q7 days  . docusate sodium  100 mg Oral BID  . feeding supplement  30 mL Oral BID  . ferric gluconate (FERRLECIT/NULECIT) IV   125 mg Intravenous Q14 Days  . metoprolol tartrate  12.5 mg Oral BID  . multivitamin  1 tablet Oral Daily  . mupirocin ointment  1 application Nasal BID  . nystatin   Topical BID  . polyethylene glycol  17 g Oral BID  . sertraline  50 mg Oral q morning - 10a   I  have reviewed scheduled and prn medications.  Physical Exam: General: Alert, nad, appropriate Heart: RRR, no murmur or rub Lungs: CTA bilaterally  Abdomen: bs pos. Soft, nontender Extremities: Dialysis Access: no pedal edema, pos. Bruit  LUA AVF  Outpatient HD: [facility] MWF, 3hr 45 min, 96.5kg EDW, 2K/2.25Ca bath, LUA AVF. EPO 5000u, venofer 100 every other wk (next 03/26/12), Heparin 10,000u. No vit D. Last PTH 16.6   Impression/Plan 1. S/P reimplantation of R THA, on 9/10 per Dr. Alvan Dame  2. ESRD, mwf. HD Monday, resume heparin (tight), up 2-3kg 3. HTN- dilt 240 xl/d and metoprolol 50 bid at home; held due to post op low BP, now back on lowdose metoprolol only. 4.  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 Afib- BB/dilt and sotalol, no coumadin due to #6/ has IVC Filter 12/07/11 insert 6. Hx hemorrhagic CVA 7. Anemia from ABL/CKD;  2u prbc's given on 9/12 for post-op anemia; cont epo, iv iron on hd   Jane Splinter  MD [facility] [phone] pgr    [phone] cell 03/30/2012, 1:26 PM

## 2012-03-30 NOTE — Progress Notes (Signed)
Physical Therapy Treatment Patient Details Name: [name] MRN: [id number] DOB: February 10, 1946 Today's Date: 03/30/2012 Time: [id number] PT Time Calculation (min): 24 min  PT Assessment / Plan / Recommendation Comments on Treatment Session  continuing improvements, especially with amb distance and activity tol    Follow Up Recommendations  Skilled nursing facility    Barriers to Discharge        Equipment Recommendations  None recommended by PT    Recommendations for Other Services    Frequency 7X/week   Plan Discharge plan remains appropriate;Frequency remains appropriate    Precautions / Restrictions Precautions Precautions: Posterior Hip;Fall Precaution Booklet Issued: Yes (comment) Precaution Comments: Patient able to recall 3/3 hip precautions Restrictions RLE Weight Bearing: Partial weight bearing RLE Partial Weight Bearing Percentage or Pounds: 50   Pertinent Vitals/Pain 8/10 pain R hip; applied ice; repositioned; Due for pain pill within 30 minutes of the end of PT session    Mobility  Bed Mobility Bed Mobility: Supine to Sit;Sitting - Scoot to Edge of Bed Supine to Sit: 4: Min assist;With rails Sitting - Scoot to [employer] of Bed: 4: Min assist;With rail Details for Bed Mobility Assistance: Moving slowly, Close guard and min physical assist to watch for hip prec Transfers Transfers: Sit to Stand;Stand to Sit Sit to Stand: 1: +2 Total assist;With upper extremity assist;From bed Sit to Stand: Patient Percentage: 80% Stand to Sit: 4: Min assist;To chair/3-in-1;With upper extremity assist Details for Transfer Assistance: Continued verbal and demo cues for positioning for Post Hip Prec prior to sitting and standing; Excellent progress Ambulation/Gait Ambulation/Gait Assistance: 1: +2 Total assist Ambulation/Gait: Patient Percentage: 70% Ambulation Distance (Feet): 25 Feet Assistive device: Rolling walker Ambulation/Gait Assistance Details: Cues for gait sequence, 50%  PWB, and to watch for hip prec with turns; Short steps, but good progress Gait Pattern: Step-to pattern;Decreased stance time - right;Antalgic;Trunk flexed Gait velocity: Decreased    Exercises     PT Diagnosis:    PT Problem List:   PT Treatment Interventions:     PT Goals Acute Rehab PT Goals Time For Goal Achievement: 04/11/12 Potential to Achieve Goals: Good Pt will go Supine/Side to Sit: with min assist;with HOB 0 degrees  May need to progress this goal next session  PT Goal: Supine/Side to Sit - Progress: Partly met Pt will go Sit to Stand: with supervision;with upper extremity assist PT Goal: Sit to Stand - Progress: Progressing toward goal Pt will go Stand to Sit: with supervision;with upper extremity assist PT Goal: Stand to Sit - Progress: Progressing toward goal Pt will Ambulate: 51 - 150 feet;with min assist;with rolling walker PT Goal: Ambulate - Progress: Progressing toward goal  Visit Information  Last PT Received On: 03/30/12 Assistance Needed: +2    Subjective Data  Subjective: Agreeable to OOB before lunch Patient Stated Goal: To be able to walk   Cognition  Overall Cognitive Status: Appears within functional limits for tasks assessed/performed Arousal/Alertness: Awake/alert Orientation Level: Appears intact for tasks assessed Behavior During Session: [facility] for tasks performed    Balance     End of Session PT - End of Session Equipment Utilized During Treatment: Gait belt Activity Tolerance: Patient tolerated treatment well;Patient limited by pain Patient left: in chair;with call bell/phone within reach Nurse Communication: Mobility status   GP     Quin Hoop [place], [place]  03/30/2012, 2:28 PM

## 2012-03-31 ENCOUNTER — Inpatient Hospital Stay (HOSPITAL_COMMUNITY): Payer: Medicare Other

## 2012-03-31 DIAGNOSIS — I959 Hypotension, unspecified: Secondary | ICD-10-CM

## 2012-03-31 LAB — RENAL FUNCTION PANEL
Albumin: 2.2 g/dL — ABNORMAL LOW (ref 3.5–5.2)
BUN: 38 mg/dL — ABNORMAL HIGH (ref 6–23)
CO2: 28 mEq/L (ref 19–32)
Calcium: 9.1 mg/dL (ref 8.4–10.5)
Chloride: 102 mEq/L (ref 96–112)
Creatinine, Ser: 2.94 mg/dL — ABNORMAL HIGH (ref 0.50–1.10)
GFR calc Af Amer: 18 mL/min — ABNORMAL LOW (ref >90)
GFR calc non Af Amer: 16 mL/min — ABNORMAL LOW (ref >90)
Glucose, Bld: 96 mg/dL (ref 70–99)
Phosphorus: 4.8 mg/dL — ABNORMAL HIGH (ref 2.3–4.6)
Potassium: 3.9 mEq/L (ref 3.5–5.1)
Sodium: 137 mEq/L (ref 135–145)

## 2012-03-31 LAB — CBC
HCT: 26.4 % — ABNORMAL LOW (ref 36.0–46.0)
Hemoglobin: 8.3 g/dL — ABNORMAL LOW (ref 12.0–15.0)
MCH: 26.6 pg (ref 26.0–34.0)
MCHC: 31.4 g/dL (ref 30.0–36.0)
MCV: 84.6 fL (ref 78.0–100.0)
Platelets: 235 10*3/uL (ref 150–400)
RBC: 3.12 MIL/uL — ABNORMAL LOW (ref 3.87–5.11)
RDW: 18.4 % — ABNORMAL HIGH (ref 11.5–15.5)
WBC: 8.1 10*3/uL (ref 4.0–10.5)

## 2012-03-31 MED ORDER — OXYCODONE-ACETAMINOPHEN 5-325 MG PO TABS
ORAL_TABLET | ORAL | Status: AC
  Administered 2012-03-31: 1 via ORAL
  Filled 2012-03-31: qty 1

## 2012-03-31 MED ORDER — HYDROCODONE-ACETAMINOPHEN 7.5-325 MG PO TABS: 1.0000 | ORAL_TABLET | ORAL | Status: DC | PRN

## 2012-03-31 MED ORDER — METHOCARBAMOL 500 MG PO TABS: 500.0000 mg | ORAL_TABLET | Freq: Four times a day (QID) | ORAL | Status: AC | PRN

## 2012-03-31 NOTE — Progress Notes (Signed)
Patient briefly seen and examined. Note plans for DC SNF today. We were consulted mainly for hypotension. Low dose metoprolol (12.5 mg BID) has been restarted given her h/o AFIb. BP in the upper 90s to 100s. I agree that from our standpoint she should be ready to go to SNF today.  Domingo Mend, MD [facility] Pager: [phone]

## 2012-03-31 NOTE — Procedures (Signed)
I have personally attended this patient's dialysis session. Goal 3 liters, BFR 400; no problems with her access (left AVF).  Tight heparin. Pre-weight 101.4  Kg  EDW 96.5  [name] B

## 2012-03-31 NOTE — Discharge Summary (Signed)
  Dictated - [phone]

## 2012-03-31 NOTE — Discharge Summary (Signed)
[name], [name]                 ACCOUNT NO.:  [id number]  MEDICAL RECORD NO.:  [id number]  LOCATION:  6712                         FACILITY:  [place]  PHYSICIAN:  Pietro Cassis. Alvan Dame, M.D.  DATE OF BIRTH:  September 07, 1945  DATE OF ADMISSION:  03/25/2012 DATE OF DISCHARGE:  03/31/2012                              DISCHARGE SUMMARY   ADMITTING DIAGNOSES: 1. Status post resection of infected right total hip. 2. Acute-on-chronic renal insufficiency currently requiring     hemodialysis.  DISCHARGE DIAGNOSES: 1. Status post resection of infected right total hip. 2. Acute-on-chronic renal insufficiency currently requiring     hemodialysis.  PROCEDURES:  The patient underwent a revision/reimplantation of a total hip replacement on March 25, 2012.  BRIEF HPI:  [name] is a 66 year old patient of mine, with history of right total hip replacement complicated by multiple surgical procedures due to periprosthetic fracture complicated by infection.  She eventually has healed her wound and was at this point ready for reimplantation. She has scheduled reimplantation with risks and benefits being discussed in the office setting.  HOSPITAL COURSE:  The patient admitted for same-day surgery on March 25, 2012.  She underwent a revision right total hip replacement.  This was done at [facility].  From the recovery room, she was transferred to the orthopedic ward at [facility].  She was noted to be relatively hypotensive throughout her initial 24 hours, and in the morning time was transferred to 2900 at [facility] which is step-down unit.  She was transferred to [facility] for a hemodialysis which is normally done Monday, Wednesday Friday.  She did not do dialysis on Wednesday due to blood pressure issues but instead started back on Thursday.  Other than the transfer over to [facility] her hospital course was unremarkable.  She had no major events.  Her right hip wound remained clean, dry,  and intact throughout the hospital stay which is significant from her previous stay.  Her hospital stay has been  significant with wound and drainage.  She and her husband discussed that she felt it would the best for her to go to rehab for short-term to work on strengthening.  Postoperatively had a partial weight bearing.  She will remain partial weightbearing for 3 weeks.  DISCHARGE INSTRUCTIONS:  The patient will be discharged to skilled nursing facility.  She will do daily dressing changes, and she will be partial weightbearing on the right lower extremity.  I will see her back in the office in 2 weeks time for staple removal and wound evaluation. If there is any problem in her wound, please notify our office at [facility] [phone] or even consider paging Dr. Alvan Dame at [phone] to respond to assistant at hospital.  From a DVT prophylaxis standpoint due to her heparin at the time of dialysis I am not going to add any additional DVT prophylaxis at this time to prevent excessive concern for bleeding based on her history of recurrent hematoma and bleeding.  Please note her discharge medications were available through the computer notes for discharge summary.  I did place her on Norco 7.5 mg 1- 2 tablets every 6 hours as needed for  pain as well as muscle relaxants. Otherwise, she was on home medicines.  She will continue doing hemodialysis Monday, Wednesday, Friday.     Pietro Cassis Alvan Dame, M.D.     MDO/MEDQ  D:  03/31/2012  T:  03/31/2012  Job:  [id number]

## 2012-03-31 NOTE — Progress Notes (Signed)
[name] Progress Note  Subjective:  Got up and walked, the first time since February!. Leaving for [facility] x 3 weeks.   Objective Filed Vitals:   03/31/12 0740 03/31/12 0817 03/31/12 0830 03/31/12 0900  BP: 136/101 114/64 102/51 94/54  Pulse: 85 88 90 93  Temp: 96.7 F (35.9 C)     TempSrc: Oral     Resp: 18     Height:      Weight: 101.4 kg (223 lb 8.7 oz)     SpO2: 96%      Physical Exam goal 3 liters General:NAD Heart:tachy rate ~100 Lungs: no wheezes or rales Abdomen: soft Extremities: no LE edema Dialysis Access: left upper AVF Qb 400  Outpatient HD: [facility] MWF, 3hr 45 min, 96.5kg EDW, 2K/2.25Ca bath, LUA AVF. EPO 5000u, venofer 100 every other wk (next 03/26/12), Heparin 10,000u. No vit D. Last PTH 16.6   Assessment/Plan: 1. S/p reimplantation of right THA 9/10 Dr. Alvan Dame- tight heparin resumed - continue at d/c 2. ESRD - MWF HD per routine 3. Anemia - Hgb stable at 9.1; aranesp 12.5 (5000 Epo as outpt) and routine venofer 4. Secondary hyperparathyroidism - no  Vit D needed 5. HTN/volume - goal today will not get her to her EDW, but wts on bedscale, not standing;  Metoprolol 12.5 bid 6. Nutrition -  Changed to high protein renal - she will need this at d/c; albumin 2.6  Jane Jacobson, PA-C [name] [phone]  03/31/2012,9:19 AM  LOS: 6 days    Additional Objective Labs: Basic Metabolic Panel:  Lab 123456 1345 03/28/12 0727 03/26/12 0240  NA 136 136 139  K 4.0 3.5 3.6  CL 100 101 105  CO2 27 29 26   GLUCOSE 114* 126* 143*  BUN 28* 20 18  CREATININE 2.70* 2.43* 2.70*  CALCIUM 9.4 8.5 8.4  ALB -- -- --  PHOS -- -- --   Liver Function Tests:  Lab 03/26/12 0240  AST 21  ALT 15  ALKPHOS 95  BILITOT 0.4  PROT 5.2*  ALBUMIN 2.6*  CBC:  Lab 03/30/12 1345 03/29/12 0525 03/28/12 0727 03/27/12 0435 03/26/12 0240  WBC 9.0 8.3 8.7 -- --  NEUTROABS -- -- -- -- --  HGB 9.1* 8.5* 9.2* -- --  HCT  29.0* 26.8* 28.7* -- --  MCV 85.0 84.5 83.7 82.3 81.2  PLT 205 155 124* -- --  Cardiac Enzymes:  Lab 03/26/12 0240  CKTOTAL 374*  CKMB 2.8  CKMBINDEX --  TROPONINI <0.30   CBG:  Lab 03/25/12 2246  GLUCAP 133*  Medications:      . calcium acetate  667 mg Oral TID WC  . Chlorhexidine Gluconate Cloth  6 each Topical Q0600  . darbepoetin  12.5 mcg Intravenous Q7 days  . docusate sodium  100 mg Oral BID  . feeding supplement  30 mL Oral BID  . ferric gluconate (FERRLECIT/NULECIT) IV  125 mg Intravenous Q14 Days  . metoprolol tartrate  12.5 mg Oral BID  . multivitamin  1 tablet Oral Daily  . mupirocin ointment  1 application Nasal BID  . nystatin   Topical BID  . polyethylene glycol  17 g Oral BID  . sertraline  50 mg Oral q morning - 10a     I have seen and examined this patient and agree with the note of MBergman PA-C.  Patient do be discharged to [facility] today, will resume outpt HD at [facility] on Wednesday.  [name],MD 03/31/2012 11:15 AM

## 2012-04-03 ENCOUNTER — Inpatient Hospital Stay (HOSPITAL_COMMUNITY): Payer: Medicare Other

## 2012-04-14 NOTE — Progress Notes (Signed)
INFECTIOUS DISEASES CLINIC  RFV: hospital follow up prosthetic joint infection Subjective:    Patient ID: [name], female    DOB: [birth year], 66 y.o.   MRN: [id number]  HPI Krystalin is a 66yo F with multiple medical problems including having a THA complicated by periprosthetic fracture and wound breakdown which eventually developed into prosthetic joint infection. She underwent implant removal and antibiotic spacer discharged on ceftazadime for 6 wks for Pseudomonal in culture ( pansensitive) but her right hip became increasing painful. She was found to have MRI concern for abscess and underwent I X D on 6/7. Cultures were no growth to date, but was maintained on ciprofloxacin daily at that time. She was discharged from hospital 3 days ago and starting to get her strength back. During these past 4 months, she had suffered worsening AKI possibly due to antibiotics and eventually became dialysis dependent. She also developed DVT for which she is on anticoagulation.  She denies fever, chills, nightsweats.  Current Outpatient Prescriptions on File Prior to Visit  Medication Sig Dispense Refill  . calcium acetate (PHOSLO) 667 MG capsule Take 667 mg by mouth 3 (three) times daily.       Marland Kitchen diltiazem (CARDIZEM CD) 240 MG 24 hr capsule Take 240 mg by mouth daily after breakfast.       . diphenhydrAMINE (BENADRYL) 25 MG tablet Take 25 mg by mouth every 6 (six) hours as needed. Itching/rash      . feeding supplement (PRO-STAT SUGAR FREE 64) LIQD Take 30 mLs by mouth 2 (two) times daily.      . metoprolol (LOPRESSOR) 50 MG tablet Take 50 mg by mouth 2 (two) times daily. Hold on mornings of dialysis (M,W,F)      . polyethylene glycol (MIRALAX / GLYCOLAX) packet Take 17 g by mouth 2 (two) times daily.      . sertraline (ZOLOFT) 50 MG tablet Take 50 mg by mouth every morning. Take before breakfast      . DISCONTD: colchicine 0.6 MG tablet Take 1 tablet (0.6 mg total) by mouth daily.  31 tablet  0  .  DISCONTD: furosemide (LASIX) 80 MG tablet Take 80-160 mg by mouth 2 (two) times daily. 2 tab in am and 1 tab      . DISCONTD: gabapentin (NEURONTIN) 300 MG capsule Take 300 mg by mouth 3 (three) times daily.       Marland Kitchen DISCONTD: metoprolol (TOPROL XL) 100 MG 24 hr tablet Take 100 mg by mouth daily before breakfast.       . DISCONTD: potassium chloride SA (KLOR-CON M20) 20 MEQ tablet Take 20 mEq by mouth daily.       Marland Kitchen DISCONTD: rivaroxaban (XARELTO) 10 MG TABS tablet Take 1 tablet (10 mg total) by mouth daily.  14 tablet  0   Active Ambulatory Problems    Diagnosis Date Noted  . Morbid obesity 10/20/2007  . DEPRESSION 11/25/2008  . WEAKNESS, LEFT SIDE OF BODY 09/21/2008  . HYPERTENSION 07/07/2007  . Atrial fibrillation 11/25/2008  . CVA 09/28/2008  . ASTHMA 07/07/2007  . RENAL FAILURE, ACUTE 11/25/2008  . 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 ARRHYTHMIA, HX OF 07/07/2007  . SINUSITIS- ACUTE-NOS 09/27/2010  . Anemia 06/06/2011  . ARF (acute renal failure) 06/06/2011  . Clostridium difficile colitis 06/09/2011  . Traumatic seroma of thigh 06/11/2011  . Absence of right hip joint with antibiotic pacer 09/03/2011  . Pneumonia 09/10/2011  . Gout 09/10/2011  . HCAP (healthcare-associated pneumonia)   .  Hypercalcemia   . 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 Pseudomonas aeruginosa infection 10/24/2011  . End stage renal disease 11/06/2011  . DVT (deep venous thrombosis), left 12/08/2011  . OSA on CPAP 12/08/2011  . Abscess of hip, right 12/24/2011  . S/P right TH revision 03/25/2012  . Expected blood loss anemia 03/26/2012  . Hypotension 03/27/2012   Resolved Ambulatory Problems    Diagnosis Date Noted  . TINEA CORPORIS 11/03/2008  . SEBACEOUS CYST, BREAST 11/03/2008  . FOLLICULITIS 123456  . Nausea with vomiting 10/04/2009  . Abdominal pain, unspecified site 10/04/2009  . Low back pain radiating to right leg 10/10/2010  . Hypokalemia 06/06/2011  .  Leukocytosis, unspecified 06/08/2011  . Acute blood loss anemia 09/04/2011  . Acute-on-chronic kidney injury 09/10/2011  . Dehydration 09/10/2011  . ESRD (end stage renal disease) 10/24/2011  . Constipation due to pain medication 02/12/2012   Past Medical History  Diagnosis Date  . Fractures, stress   . 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 Blood transfusion   . Cardiomyopathy Mar 2013  . Hypertension   . Asthma   . Eczema   . Constipation   . Oligouria   . Depression   . Stroke 2009  . Peripheral vascular disease   . DVT of lower extremity, bilateral 12/21/11  . Arrhythmia   . Sleep apnea   . ESRD (end stage renal disease) on dialysis 09/2011  . Arthritis    History  Substance Use Topics  . Smoking status: Never Smoker   . Smokeless tobacco: Never Used  . Alcohol Use: No  family history includes Cancer in her father; Diabetes in her mother; Hodgkin's lymphoma (age of onset:32) in an unspecified family member; and Hypertension in her mother.  Review of Systems  Constitutional: Negative for fever, chills, diaphoresis, activity change, appetite change, fatigue and unexpected weight change.  HENT: Negative for congestion, sore throat, rhinorrhea, sneezing, trouble swallowing and sinus pressure.  Eyes: Negative for photophobia and visual disturbance.  Respiratory: Negative for cough, chest tightness, shortness of breath, wheezing and stridor.  Cardiovascular: Negative for chest pain, palpitations and leg swelling.  Gastrointestinal: Negative for nausea, vomiting, abdominal pain, diarrhea, constipation, blood in stool, abdominal distention and anal bleeding.  Genitourinary: Negative for dysuria, hematuria, flank pain and difficulty urinating.  Musculoskeletal: Negative for myalgias, back pain, joint swelling, arthralgias and gait problem.  Skin: Negative for color change, pallor, rash and wound.  Neurological: Negative for dizziness, tremors, weakness and  light-headedness.  Hematological: Negative for adenopathy. Does not bruise/bleed easily.  Psychiatric/Behavioral: Negative for behavioral problems, confusion, sleep disturbance, dysphoric mood, decreased concentration and agitation.       Objective:   Physical Exam BP 112/74  Pulse 80  Temp 97.8 F (36.6 C) (Oral) Physical Exam  Constitutional: Horiented to person, place, and time. appears well-developed and well-nourished. No distress.  HENT:  Mouth/Throat: Oropharynx is clear and moist. No oropharyngeal exudate.  Cardiovascular: Normal rate, regular rhythm and normal heart sounds. Exam reveals no gallop and no friction rub.  No murmur heard.  Pulmonary/Chest: Effort normal and breath sounds normal. No respiratory distress. He has no wheezes.  Abdominal: Soft. Bowel sounds are normal. s no distension. There is no tenderness.  Lymphadenopathy:  no cervical adenopathy.  Skin: Skin is warm and dry. No rash noted. No erythema.      Assessment & Plan:  123458 F with complicated ortho history of having Right THA complicated by periprosthesis fracture and infection s/p 2 staged revision.  She has antibiotic spacer, treated for 6 wks with ceftazadime. S/p I x D for possible abscess, cultures were negative.  -- finish 6 wks of therapy, have orthopedic surgery repeat arthrocentesis off of antibiotics and send fluid for culture to ensure that new prosthesis is safe to implant in infection free bed. If evidence of infection still exists, either by high WBC in arthrocentesis or gram stain + or culture +, would repeat an additional 6 wks of IV therapy.

## 2012-04-20 NOTE — Progress Notes (Signed)
Subjective:    Patient ID: [name], female    DOB: 1945-07-19, 67 y.o.   MRN: [id number]  HPI Ameliya is a 66yo F with multiple medical problems including having a THA complicated by periprosthetic fracture and wound breakdown which eventually developed into prosthetic joint infection. She underwent implant removal and antibiotic spacer discharged on ceftazadime for 6 wks for Pseudomonal in culture ( pansensitive) but her right hip became increasing painful. She was found to have MRI concern for abscess and underwent I X D on 6/7. Cultures were no growth to date, but was maintained on ciprofloxacin daily at that time. She was discharged from hospital 3 days ago and starting to get her strength back. During these past 4 months, she had suffered worsening AKI possibly due to antibiotics and eventually became dialysis dependent. She also developed DVT for which she is on anticoagulation. She is doing well on cipro.   She denies any worsening pain in her right hip. She is looking forward to having her next surgery, new implant.  Current Outpatient Prescriptions on File Prior to Visit  Medication Sig Dispense Refill  . calcium acetate (PHOSLO) 667 MG capsule Take 667 mg by mouth 3 (three) times daily.       Marland Kitchen diltiazem (CARDIZEM CD) 240 MG 24 hr capsule Take 240 mg by mouth daily after breakfast.       . diphenhydrAMINE (BENADRYL) 25 MG tablet Take 25 mg by mouth every 6 (six) hours as needed. Itching/rash      . feeding supplement (PRO-STAT SUGAR FREE 64) LIQD Take 30 mLs by mouth 2 (two) times daily.      . metoprolol (LOPRESSOR) 50 MG tablet Take 50 mg by mouth 2 (two) times daily. Hold on mornings of dialysis (M,W,F)      . polyethylene glycol (MIRALAX / GLYCOLAX) packet Take 17 g by mouth 2 (two) times daily.      . sertraline (ZOLOFT) 50 MG tablet Take 50 mg by mouth every morning. Take before breakfast      . DISCONTD: colchicine 0.6 MG tablet Take 1 tablet (0.6 mg total) by mouth daily.  31  tablet  0  . DISCONTD: furosemide (LASIX) 80 MG tablet Take 80-160 mg by mouth 2 (two) times daily. 2 tab in am and 1 tab      . DISCONTD: gabapentin (NEURONTIN) 300 MG capsule Take 300 mg by mouth 3 (three) times daily.       Marland Kitchen DISCONTD: metoprolol (TOPROL XL) 100 MG 24 hr tablet Take 100 mg by mouth daily before breakfast.       . DISCONTD: potassium chloride SA (KLOR-CON M20) 20 MEQ tablet Take 20 mEq by mouth daily.       Marland Kitchen DISCONTD: rivaroxaban (XARELTO) 10 MG TABS tablet Take 1 tablet (10 mg total) by mouth daily.  14 tablet  0   Active Ambulatory Problems    Diagnosis Date Noted  . Morbid obesity 10/20/2007  . DEPRESSION 11/25/2008  . WEAKNESS, LEFT SIDE OF BODY 09/21/2008  . HYPERTENSION 07/07/2007  . Atrial fibrillation 11/25/2008  . CVA 09/28/2008  . ASTHMA 07/07/2007  . RENAL FAILURE, ACUTE 11/25/2008  . 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 ARRHYTHMIA, HX OF 07/07/2007  . SINUSITIS- ACUTE-NOS 09/27/2010  . Anemia 06/06/2011  . ARF (acute renal failure) 06/06/2011  . Clostridium difficile colitis 06/09/2011  . Traumatic seroma of thigh 06/11/2011  . Absence of right hip joint with antibiotic pacer 09/03/2011  . Pneumonia 09/10/2011  .  Gout 09/10/2011  . HCAP (healthcare-associated pneumonia)   . Hypercalcemia   . 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 Pseudomonas aeruginosa infection 10/24/2011  . End stage renal disease 11/06/2011  . DVT (deep venous thrombosis), left 12/08/2011  . OSA on CPAP 12/08/2011  . Abscess of hip, right 12/24/2011  . S/P right TH revision 03/25/2012  . Expected blood loss anemia 03/26/2012  . Hypotension 03/27/2012   Resolved Ambulatory Problems    Diagnosis Date Noted  . TINEA CORPORIS 11/03/2008  . SEBACEOUS CYST, BREAST 11/03/2008  . FOLLICULITIS 123456  . Nausea with vomiting 10/04/2009  . Abdominal pain, unspecified site 10/04/2009  . Low back pain radiating to right leg 10/10/2010  . Hypokalemia  06/06/2011  . Leukocytosis, unspecified 06/08/2011  . Acute blood loss anemia 09/04/2011  . Acute-on-chronic kidney injury 09/10/2011  . Dehydration 09/10/2011  . ESRD (end stage renal disease) 10/24/2011  . Constipation due to pain medication 02/12/2012   Past Medical History  Diagnosis Date  . Fractures, stress   . 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 Blood transfusion   . Cardiomyopathy Mar 2013  . Hypertension   . Asthma   . Eczema   . Constipation   . Oligouria   . Depression   . Stroke 2009  . Peripheral vascular disease   . DVT of lower extremity, bilateral 12/21/11  . Arrhythmia   . Sleep apnea   . ESRD (end stage renal disease) on dialysis 09/2011  . Arthritis    History  Substance Use Topics  . Smoking status: Never Smoker   . Smokeless tobacco: Never Used  . Alcohol Use: No  family history includes Cancer in her father; Diabetes in her mother; Hodgkin's lymphoma (age of onset:32) in an unspecified family member; and Hypertension in her mother.    Review of Systems  Constitutional: Negative for fever, chills, diaphoresis, activity change, appetite change, fatigue and unexpected weight change.  HENT: Negative for congestion, sore throat, rhinorrhea, sneezing, trouble swallowing and sinus pressure.  Eyes: Negative for photophobia and visual disturbance.  Respiratory: Negative for cough, chest tightness, shortness of breath, wheezing and stridor.  Cardiovascular: Negative for chest pain, palpitations and leg swelling.  Gastrointestinal: Negative for nausea, vomiting, abdominal pain, diarrhea, constipation, blood in stool, abdominal distention and anal bleeding.  Genitourinary: Negative for dysuria, hematuria, flank pain and difficulty urinating.  Musculoskeletal: Negative for myalgias, back pain, joint swelling, arthralgias and gait problem.  Skin: Negative for color change, pallor, rash and wound.  Neurological: Negative for dizziness, tremors, weakness  and light-headedness.  Hematological: Negative for adenopathy. Does not bruise/bleed easily.  Psychiatric/Behavioral: Negative for behavioral problems, confusion, sleep disturbance, dysphoric mood, decreased concentration and agitation.       Objective:   Physical Exam BP 119/76  Pulse 70  Temp 97.7 F (36.5 C) (Oral) Gen= a x o by 3, in NAD skin= right hip, healed surgery. 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 Recommend to check inflammatory markers of ESR and CRP today. Recommend to finish 2 additional weeks of antibiotics. Will defer to Dr. Alvan Dame to aspirate joint to see if she needs an additional 6 wk course of therapy. If aspirate has low cells  and culture negative, safe to proceed to new implant placement.

## 2012-05-13 ENCOUNTER — Ambulatory Visit (INDEPENDENT_AMBULATORY_CARE_PROVIDER_SITE_OTHER): Payer: Medicare Other | Admitting: Family Medicine

## 2012-05-13 ENCOUNTER — Encounter: Payer: Self-pay | Admitting: Family Medicine

## 2012-05-13 VITALS — BP 132/76 | HR 67 | Temp 98.4°F

## 2012-05-13 DIAGNOSIS — L02419 Cutaneous abscess of limb, unspecified: Secondary | ICD-10-CM

## 2012-05-13 DIAGNOSIS — L0390 Cellulitis, unspecified: Secondary | ICD-10-CM

## 2012-05-13 DIAGNOSIS — N19 Unspecified kidney failure: Secondary | ICD-10-CM

## 2012-05-13 DIAGNOSIS — IMO0002 Reserved for concepts with insufficient information to code with codable children: Secondary | ICD-10-CM

## 2012-05-13 DIAGNOSIS — N179 Acute kidney failure, unspecified: Secondary | ICD-10-CM

## 2012-05-13 DIAGNOSIS — I10 Essential (primary) hypertension: Secondary | ICD-10-CM

## 2012-05-13 DIAGNOSIS — L0291 Cutaneous abscess, unspecified: Secondary | ICD-10-CM

## 2012-05-13 MED ORDER — DOXYCYCLINE HYCLATE 100 MG PO TABS: 100.0000 mg | ORAL_TABLET | Freq: Two times a day (BID) | ORAL | Status: DC

## 2012-05-13 NOTE — Patient Instructions (Signed)
Abscess An abscess is an infected area that contains a collection of pus and debris. It can occur in almost any part of the body. An abscess is also known as a furuncle or boil. CAUSES   An abscess occurs when tissue gets infected. This can occur from blockage of oil or sweat glands, infection of hair follicles, or a minor injury to the skin. As the body tries to fight the infection, pus collects in the area and creates pressure under the skin. This pressure causes pain. People with weakened immune systems have difficulty fighting infections and get certain abscesses more often.   SYMPTOMS Usually an abscess develops on the skin and becomes a painful mass that is red, warm, and tender. If the abscess forms under the skin, you may feel a moveable soft area under the skin. Some abscesses break open (rupture) on their own, but most will continue to get worse without care. The infection can spread deeper into the body and eventually into the bloodstream, causing you to feel ill.   DIAGNOSIS   Your caregiver will take your medical history and perform a physical exam. A sample of fluid may also be taken from the abscess to determine what is causing your infection. TREATMENT   Your caregiver may prescribe antibiotic medicines to fight the infection. However, taking antibiotics alone usually does not cure an abscess. Your caregiver may need to make a small cut (incision) in the abscess to drain the pus. In some cases, gauze is packed into the abscess to reduce pain and to continue draining the area. HOME CARE INSTRUCTIONS    Only take over-the-counter or prescription medicines for pain, discomfort, or fever as directed by your caregiver.   If you were prescribed antibiotics, take them as directed. Finish them even if you start to feel better.   If gauze is used, follow your caregiver's directions for changing the gauze.   To avoid spreading the infection:   Keep your draining abscess covered with a  bandage.   Wash your hands well.   Do not share personal care items, towels, or whirlpools with others.   Avoid skin contact with others.   Keep your skin and clothes clean around the abscess.   Keep all follow-up appointments as directed by your caregiver.  SEEK MEDICAL CARE IF:    You have increased pain, swelling, redness, fluid drainage, or bleeding.   You have muscle aches, chills, or a general ill feeling.   You have a fever.  MAKE SURE YOU:    Understand these instructions.   Will watch your condition.   Will get help right away if you are not doing well or get worse.  Document Released: 04/11/2005 Document Revised: 01/01/2012 Document Reviewed: 09/14/2011 [employer] Patient Information 2013 [employer].    

## 2012-05-15 ENCOUNTER — Encounter: Payer: Self-pay | Admitting: Family Medicine

## 2012-05-15 ENCOUNTER — Ambulatory Visit (INDEPENDENT_AMBULATORY_CARE_PROVIDER_SITE_OTHER): Payer: Medicare Other | Admitting: General Surgery

## 2012-05-15 VITALS — BP 138/82 | HR 64 | Temp 97.7°F | Resp 16 | Ht 66.0 in | Wt 208.0 lb

## 2012-05-15 DIAGNOSIS — IMO0002 Reserved for concepts with insufficient information to code with codable children: Secondary | ICD-10-CM

## 2012-05-15 DIAGNOSIS — L02419 Cutaneous abscess of limb, unspecified: Secondary | ICD-10-CM | POA: Insufficient documentation

## 2012-05-15 NOTE — Progress Notes (Signed)
  Subjective:    Patient ID: [name], female    DOB: 1945-11-02, 66 y.o.   MRN: [id number]  HPI Pt here for f/u abscess on R shoulder-- she is on abx.   It was drained in rehab but it has not completely resolved-- its been a month per pt.   She was in hospital for hip revsion and developed ARF from abx given.  Pt is currently getting dialysis.    Review of Systems As above    Objective:   Physical Exam  Constitutional: She is oriented to person, place, and time. She appears well-developed and well-nourished.  Neck: Normal range of motion. Neck supple.  Cardiovascular: Normal rate, regular rhythm and normal heart sounds.   Pulmonary/Chest: Effort normal and breath sounds normal.  Neurological: She is alert and oriented to person, place, and time.  Skin:       + abscess R shoulder -- tender to touch and + odor     No drainage   Psychiatric: She has a normal mood and affect. Her behavior is normal. Judgment and thought content normal.          Assessment & Plan:

## 2012-05-15 NOTE — Patient Instructions (Signed)
Keep wound covered and change dressing daily.  Wash with soap and water.  Return to the office if you have any problems.

## 2012-05-15 NOTE — Assessment & Plan Note (Signed)
Per nephro  On dialysis

## 2012-05-15 NOTE — Progress Notes (Signed)
Shoulder infection  HISTORY:  [name] is a 66 y.o. female who presents to clinic with a draining shoulder wound.  This was I&D'd several weeks ago but continues to drain.  She denies any fevers  Past Medical History  Diagnosis Date  . Fractures, stress     in both feet--6 OR 7 YRS AGO--RESOLVED  . 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 Gout     doesn't require meds   . Anemia   . Blood transfusion     never had a reaction to blood transfusion  . Cardiomyopathy Mar 2013    Mild, EF 50-55% by Mar 2013 ECHO, diast dysfxn II  . Hypercalcemia     09/10/11  . HCAP (healthcare-associated pneumonia)     09/10/11  . Hypertension     takes Diltiazem daily   . Asthma     as a child  . Eczema   . Constipation     takes Miralax daily  . Oligouria   . Depression     takes Zoloft daily  . Stroke 2009    denies residual  . Peripheral vascular disease   . DVT of lower extremity, bilateral 12/21/11    "they're there now; been there for 2 wks"  . Atrial fibrillation      HX OF CEREBRAL BLEED WHILE ON COUMADIN-SO PT NOT ON ANY BLOOD THINNERS NOW  . Arrhythmia     takes Metoprolol daily  . Sleep apnea     HAS CPAP-sleep study done 2007--PT NOT USING AT PRESENT  . ESRD (end stage renal disease) on dialysis 09/2011    Started dialysis Mar 2013 on M/W/F on [facility]  . Arthritis     HIP       Past Surgical History  Procedure Date  . Cardiac valve surgery 1960     FOR ATRIAL SEPTAL DEFECT  . I&d extremity 09/15/2011    Procedure: IRRIGATION AND DEBRIDEMENT EXTREMITY w REMOVAL OF HARDWARE;  Surgeon: [name], MD;  Location: [facility];  Service: Orthopedics;  Laterality: Right;  . Av fistula placement 09/28/2011    Procedure: ARTERIOVENOUS (AV) FISTULA CREATION;  Surgeon: Rosetta Posner, MD;  Location: [place];  Service: Vascular;  Laterality: Left;  . Cystoscopy     many yrs ago  . Colonoscopy   . Irrigation and debridement abscess 12/21/11    right hip  . Vena cava filter  placement 11/2011  . Total hip arthroplasty 02/2011    right THA 02/2011, I&D/removal of hardware 09/2011,, repeat I&D Jun 2013, reimplantation R THA 03-26-2012  . Femur fracture surgery 03/2011; 09/03/2011    right; "had 2, 2 wk apart in 2012; broke it again 08/2011 & had OR"  . Insertion of dialysis catheter     Procedure: INSERTION OF DIALYSIS CATHETER;  Surgeon: Rosetta Posner, MD;  Location: [facility];  Service: Vascular;  Laterality: Right;  . Cholecystectomy     1980's  . Incision and drainage hip 12/21/2011    Procedure: IRRIGATION AND DEBRIDEMENT HIP;  Surgeon: [name], MD;  Location: [place];  Service: Orthopedics;  Laterality: Right;  I&D RIGHT HIP WITH PLACEMENT ANTIBIOTIC SPACER  . Total hip revision 03/25/2012    Procedure: TOTAL HIP REVISION;  Surgeon: [name], MD;  Location: [facility];  Service: Orthopedics;  Laterality: Right;  Right Total Hip Reimplantation      Current Outpatient Prescriptions  Medication Sig Dispense Refill  . ampicillin (PRINCIPEN) 500 MG  capsule Take 1 capsule by mouth 2 (two) times daily.      . calcium acetate (PHOSLO) 667 MG capsule Take 667 mg by mouth 3 (three) times daily.       Marland Kitchen diltiazem (CARDIZEM CD) 240 MG 24 hr capsule Take 240 mg by mouth daily after breakfast.       . diphenhydrAMINE (BENADRYL) 25 MG tablet Take 25 mg by mouth every 6 (six) hours as needed. Itching/rash      . docusate sodium (COLACE) 100 MG capsule Take 100 mg by mouth daily.      Marland Kitchen doxycycline (VIBRA-TABS) 100 MG tablet Take 1 tablet (100 mg total) by mouth 2 (two) times daily.  20 tablet  0  . feeding supplement (PRO-STAT SUGAR FREE 64) LIQD Take 30 mLs by mouth 2 (two) times daily.      . folic acid-vitamin b complex-vitamin c-selenium-zinc (DIALYVITE) 3 MG TABS Take 1 tablet by mouth daily.      Marland Kitchen HYDROcodone-acetaminophen (NORCO) 7.5-325 MG per tablet Take 1-2 tablets by mouth every 4 (four) hours as needed. Pain  90 tablet  0  . metoprolol (LOPRESSOR) 50 MG tablet Take 50  mg by mouth 2 (two) times daily. Hold on mornings of dialysis (M,W,F)      . polyethylene glycol (MIRALAX / GLYCOLAX) packet Take 17 g by mouth 2 (two) times daily.      . sertraline (ZOLOFT) 50 MG tablet Take 50 mg by mouth every morning. Take before breakfast      . sotalol (BETAPACE) 80 MG tablet Take 80 mg by mouth every other day.      Marland Kitchen DISCONTD: colchicine 0.6 MG tablet Take 1 tablet (0.6 mg total) by mouth daily.  31 tablet  0  . DISCONTD: furosemide (LASIX) 80 MG tablet Take 80-160 mg by mouth 2 (two) times daily. 2 tab in am and 1 tab      . DISCONTD: gabapentin (NEURONTIN) 300 MG capsule Take 300 mg by mouth 3 (three) times daily.       Marland Kitchen DISCONTD: metoprolol (TOPROL XL) 100 MG 24 hr tablet Take 100 mg by mouth daily before breakfast.       . DISCONTD: potassium chloride SA (KLOR-CON M20) 20 MEQ tablet Take 20 mEq by mouth daily.       Marland Kitchen DISCONTD: rivaroxaban (XARELTO) 10 MG TABS tablet Take 1 tablet (10 mg total) by mouth daily.  14 tablet  0     Allergies  Allergen Reactions  . Ace Inhibitors   . Warfarin Sodium Other (See Comments)    Caused her to have a stroke      Family History  Problem Relation Age of Onset  . Diabetes Mother   . Hypertension Mother   . Hodgkin's lymphoma  32    dscd---HODGKINS DISEASE  . Cancer Father       History   Social History  . Marital Status: Married    Spouse Name: N/A    Number of Children: N/A  . Years of Education: N/A   Social History Main Topics  . Smoking status: Never Smoker   . Smokeless tobacco: Never Used  . Alcohol Use: No  . Drug Use: No  . Sexually Active: No   Other Topics Concern  . Not on file   Social History Narrative  . No narrative on file       REVIEW OF SYSTEMS - PERTINENT POSITIVES ONLY: Review of Systems - General ROS: negative for - chills  or fever Hematological and Lymphatic ROS: negative for - bleeding problems or blood clots Respiratory ROS: no cough, shortness of breath, or  wheezing Cardiovascular ROS: no chest pain or dyspnea on exertion  EXAM: Filed Vitals:   05/15/12 1122  BP: 138/82  Pulse: 64  Temp: 97.7 F (36.5 C)  Resp: 16    General appearance: alert, cooperative and no distress Extremities: R shoulder mass with purulent drainage.   Fistula in L arm  Procedure: abscess drainage Surgeon: Marcello Moores Assistant: Sharyn Lull After the risks and benefits were explained, verbal consent was obtained for above procedure  Anesthesia: lidocaine, sub-q Diagnosis: shoulder abscess Findings: fibrinous and purulent material, necrotic tissue at base Wound was opened with an 11 blade.  Necrotic tissue debrided.  Pressure applied for hemostasis.  Sterile dressing applied.  ASSESSMENT AND PLAN: Left shoulder abscess- drained.  Follow up PRN   Rosario Adie, MD Colon and Rectal Surgery / [facility]      Visit Diagnoses: 1. Shoulder abscess     Primary Care Physician: Garnet Koyanagi, DO

## 2012-05-15 NOTE — Assessment & Plan Note (Signed)
abx changed---see orders Refer to surgery

## 2012-05-27 ENCOUNTER — Other Ambulatory Visit (HOSPITAL_COMMUNITY): Payer: Self-pay | Admitting: Emergency Medicine

## 2012-05-27 DIAGNOSIS — Z1231 Encounter for screening mammogram for malignant neoplasm of breast: Secondary | ICD-10-CM

## 2012-05-28 ENCOUNTER — Other Ambulatory Visit: Payer: Self-pay

## 2012-05-28 MED ORDER — SERTRALINE HCL 50 MG PO TABS: 50.0000 mg | ORAL_TABLET | Freq: Every morning | ORAL | Status: DC

## 2012-05-28 NOTE — Telephone Encounter (Signed)
Pt called [facility] triage stating she had stopped taking Zoloft awhile back and needs to restart. OV 05/13/12 This was prescribed by another MD. Plz advise    MW

## 2012-05-29 ENCOUNTER — Telehealth: Payer: Self-pay | Admitting: Family Medicine

## 2012-05-29 NOTE — Telephone Encounter (Signed)
To MD for FYI.       KP 

## 2012-05-29 NOTE — Telephone Encounter (Signed)
[name] with [facility] called stating the pt has been discharged by SNF and will have PT at home.  The wound is completely healed. Call her back at [phone] with any questions.

## 2012-06-19 ENCOUNTER — Ambulatory Visit (HOSPITAL_COMMUNITY)
Admission: RE | Admit: 2012-06-19 | Discharge: 2012-06-19 | Disposition: A | Discharge status: Home / Self Care | Payer: Medicare Other | Source: Ambulatory Visit | Attending: Emergency Medicine | Admitting: Emergency Medicine

## 2012-06-19 DIAGNOSIS — Z1231 Encounter for screening mammogram for malignant neoplasm of breast: Secondary | ICD-10-CM

## 2013-01-16 DIAGNOSIS — N186 End stage renal disease: Secondary | ICD-10-CM | POA: Diagnosis not present

## 2013-04-07 DIAGNOSIS — Z9989 Dependence on other enabling machines and devices: Secondary | ICD-10-CM | POA: Insufficient documentation

## 2013-04-07 DIAGNOSIS — Z8673 Personal history of transient ischemic attack (TIA), and cerebral infarction without residual deficits: Secondary | ICD-10-CM | POA: Insufficient documentation

## 2013-04-07 DIAGNOSIS — E669 Obesity, unspecified: Secondary | ICD-10-CM

## 2013-04-07 DIAGNOSIS — Z95828 Presence of other vascular implants and grafts: Secondary | ICD-10-CM | POA: Insufficient documentation

## 2013-04-07 DIAGNOSIS — E213 Hyperparathyroidism, unspecified: Secondary | ICD-10-CM

## 2013-04-07 HISTORY — DX: Obesity, unspecified: E66.9

## 2013-04-07 HISTORY — DX: Hyperparathyroidism, unspecified: E21.3

## 2013-05-08 ENCOUNTER — Ambulatory Visit (INDEPENDENT_AMBULATORY_CARE_PROVIDER_SITE_OTHER): Payer: Medicare Other | Admitting: Family Medicine

## 2013-05-08 ENCOUNTER — Encounter: Payer: Self-pay | Admitting: Family Medicine

## 2013-05-08 VITALS — BP 101/70 | HR 95 | Temp 97.8°F | Wt 220.0 lb

## 2013-05-08 DIAGNOSIS — IMO0002 Reserved for concepts with insufficient information to code with codable children: Secondary | ICD-10-CM

## 2013-05-08 DIAGNOSIS — L0291 Cutaneous abscess, unspecified: Secondary | ICD-10-CM

## 2013-05-08 DIAGNOSIS — Z23 Encounter for immunization: Secondary | ICD-10-CM

## 2013-05-08 DIAGNOSIS — L02411 Cutaneous abscess of right axilla: Secondary | ICD-10-CM

## 2013-05-08 MED ORDER — DOXYCYCLINE HYCLATE 100 MG PO TABS: 100.0000 mg | ORAL_TABLET | Freq: Two times a day (BID) | ORAL | Status: DC

## 2013-05-08 NOTE — Patient Instructions (Signed)
F/u 1 week or sooner if necessary ----f/u abscess  Abscess An abscess is an infected area that contains a collection of pus and debris.It can occur in almost any part of the body. An abscess is also known as a furuncle or boil. CAUSES  An abscess occurs when tissue gets infected. This can occur from blockage of oil or sweat glands, infection of hair follicles, or a minor injury to the skin. As the body tries to fight the infection, pus collects in the area and creates pressure under the skin. This pressure causes pain. People with weakened immune systems have difficulty fighting infections and get certain abscesses more often.  SYMPTOMS Usually an abscess develops on the skin and becomes a painful mass that is red, warm, and tender. If the abscess forms under the skin, you may feel a moveable soft area under the skin. Some abscesses break open (rupture) on their own, but most will continue to get worse without care. The infection can spread deeper into the body and eventually into the bloodstream, causing you to feel ill.  DIAGNOSIS  Your caregiver will take your medical history and perform a physical exam. A sample of fluid may also be taken from the abscess to determine what is causing your infection. TREATMENT  Your caregiver may prescribe antibiotic medicines to fight the infection. However, taking antibiotics alone usually does not cure an abscess. Your caregiver may need to make a small cut (incision) in the abscess to drain the pus. In some cases, gauze is packed into the abscess to reduce pain and to continue draining the area. HOME CARE INSTRUCTIONS   Only take over-the-counter or prescription medicines for pain, discomfort, or fever as directed by your caregiver.  If you were prescribed antibiotics, take them as directed. Finish them even if you start to feel better.  If gauze is used, follow your caregiver's directions for changing the gauze.  To avoid spreading the infection:  Keep  your draining abscess covered with a bandage.  Wash your hands well.  Do not share personal care items, towels, or whirlpools with others.  Avoid skin contact with others.  Keep your skin and clothes clean around the abscess.  Keep all follow-up appointments as directed by your caregiver. SEEK MEDICAL CARE IF:   You have increased pain, swelling, redness, fluid drainage, or bleeding.  You have muscle aches, chills, or a general ill feeling.  You have a fever. MAKE SURE YOU:   Understand these instructions.  Will watch your condition.  Will get help right away if you are not doing well or get worse. Document Released: 04/11/2005 Document Revised: 01/01/2012 Document Reviewed: 09/14/2011 [facility] Patient Information 2014 [employer].

## 2013-05-09 ENCOUNTER — Encounter: Payer: Self-pay | Admitting: Family Medicine

## 2013-05-09 DIAGNOSIS — L98499 Non-pressure chronic ulcer of skin of other sites with unspecified severity: Secondary | ICD-10-CM | POA: Insufficient documentation

## 2013-05-09 NOTE — Progress Notes (Signed)
  Subjective:    Patient ID: KAHLEA RAZEY, female    DOB: 01/05/1946, 67 y.o.   MRN: [id number]  HPI Pt here with her husband c/o abscess R axilla x several days.  No other symptoms.   Review of Systems As above    Objective:   Physical Exam BP 101/70  Pulse 95  Temp(Src) 97.8 F (36.6 C) (Oral)  Wt 220 lb (99.791 kg)  BMI 35.53 kg/m2  SpO2 97% General appearance: alert, cooperative, appears stated age and no distress Skin: abscess draining R axilla, culture done        Assessment & Plan:

## 2013-05-09 NOTE — Assessment & Plan Note (Signed)
abx per orders Culture done To surgery if no better F/u next week

## 2013-05-11 LAB — WOUND CULTURE
Gram Stain: NONE SEEN
Gram Stain: NONE SEEN
Gram Stain: NONE SEEN

## 2013-05-12 ENCOUNTER — Other Ambulatory Visit: Payer: Self-pay

## 2013-05-12 MED ORDER — NYSTATIN 100000 UNIT/GM EX POWD: CUTANEOUS | Status: DC

## 2013-05-14 ENCOUNTER — Encounter: Payer: Self-pay | Admitting: Family Medicine

## 2013-05-14 ENCOUNTER — Ambulatory Visit (INDEPENDENT_AMBULATORY_CARE_PROVIDER_SITE_OTHER): Payer: Medicare Other | Admitting: Family Medicine

## 2013-05-14 VITALS — BP 116/72 | HR 110 | Temp 98.2°F | Wt 225.0 lb

## 2013-05-14 DIAGNOSIS — L98491 Non-pressure chronic ulcer of skin of other sites limited to breakdown of skin: Secondary | ICD-10-CM

## 2013-05-14 DIAGNOSIS — L98499 Non-pressure chronic ulcer of skin of other sites with unspecified severity: Secondary | ICD-10-CM

## 2013-05-14 DIAGNOSIS — S41101D Unspecified open wound of right upper arm, subsequent encounter: Secondary | ICD-10-CM

## 2013-05-14 NOTE — Progress Notes (Signed)
  Subjective:    Patient ID: [name], female    DOB: February 17, 1946, 67 y.o.   MRN: [id number]  HPI Pt here f/u ulceration R axilla.  No better.     Review of Systems As above    Objective:   Physical Exam BP 116/72  Pulse 110  Temp(Src) 98.2 F (36.8 C) (Oral)  Wt 225 lb (102.059 kg)  BMI 36.33 kg/m2  SpO2 95% General appearance: normal Skin: ulceration R axilla no better       Assessment & Plan:

## 2013-05-14 NOTE — Assessment & Plan Note (Signed)
Refer to wound clinic for debridement Finish meds

## 2013-05-14 NOTE — Patient Instructions (Signed)
Skin Ulcer  A skin ulcer is an open sore that can be shallow or deep. Skin ulcers sometimes become infected and are difficult to treat. It may be 1 month or longer before real healing progress is made.  CAUSES    Injury.   Problems with the veins or arteries.   Diabetes.   Insect bites.   Bedsores.   Inflammatory conditions.  SYMPTOMS    Pain, redness, swelling, and tenderness around the ulcer.   Fever.   Bleeding from the ulcer.   Yellow or clear fluid coming from the ulcer.  DIAGNOSIS   There are many types of skin ulcers. Any open sores will be examined. Certain tests will be done to determine the kind of ulcer you have. The right treatment depends on the type of ulcer you have.  TREATMENT   Treatment is a long-term challenge. It may include:   Wearing an elastic wrap, compression stockings, or gel cast over the ulcer area.   Taking antibiotic medicines or putting antibiotic creams on the affected area if there is an infection.  HOME CARE INSTRUCTIONS   Put on your bandages (dressings), wraps, or casts over the ulcer as directed by your caregiver.   Change all dressings as directed by your caregiver.   Take all medicines as directed by your caregiver.   Keep the affected area clean and dry.   Avoid injuries to the affected area.   Eat a well-balanced, healthy diet that includes plenty of fruit and vegetables.   If you smoke, consider quitting or decreasing the amount of cigarettes you smoke.   Once the ulcer heals, get regular exercise as directed by your caregiver.   Work with your caregiver to make sure your blood pressure, cholesterol, and diabetes are well-controlled.   Keep your skin moisturized. Dry skin can crack and lead to skin ulcers.  SEEK IMMEDIATE MEDICAL CARE IF:    Your pain gets worse.   You have swelling, redness, or fluids around the ulcer.   You have chills.   You have a fever.  MAKE SURE YOU:    Understand these instructions.   Will watch your condition.   Will get  help right away if you are not doing well or get worse.  Document Released: 08/09/2004 Document Revised: 09/24/2011 Document Reviewed: 02/16/2011  Exit[employer] Patient Information 2014 [employer].

## 2013-05-20 ENCOUNTER — Telehealth: Payer: Self-pay

## 2013-05-20 NOTE — Telephone Encounter (Signed)
New problem     Status of fax that was sent over last week.

## 2013-05-20 NOTE — Telephone Encounter (Signed)
LVm for [name] to return call.

## 2013-05-20 NOTE — Telephone Encounter (Signed)
Spoke to [facility] they needed updated fax number to send clearance request to [name].

## 2013-05-21 ENCOUNTER — Other Ambulatory Visit: Payer: Self-pay | Admitting: General Surgery

## 2013-05-21 DIAGNOSIS — Z01818 Encounter for other preprocedural examination: Secondary | ICD-10-CM

## 2013-05-22 ENCOUNTER — Encounter (HOSPITAL_BASED_OUTPATIENT_CLINIC_OR_DEPARTMENT_OTHER): Payer: Medicare Other | Attending: General Surgery

## 2013-05-22 DIAGNOSIS — I10 Essential (primary) hypertension: Secondary | ICD-10-CM | POA: Insufficient documentation

## 2013-05-22 DIAGNOSIS — Z992 Dependence on renal dialysis: Secondary | ICD-10-CM | POA: Insufficient documentation

## 2013-05-22 DIAGNOSIS — J45909 Unspecified asthma, uncomplicated: Secondary | ICD-10-CM | POA: Insufficient documentation

## 2013-05-22 DIAGNOSIS — I509 Heart failure, unspecified: Secondary | ICD-10-CM | POA: Insufficient documentation

## 2013-05-22 DIAGNOSIS — Z8673 Personal history of transient ischemic attack (TIA), and cerebral infarction without residual deficits: Secondary | ICD-10-CM | POA: Insufficient documentation

## 2013-05-22 DIAGNOSIS — IMO0002 Reserved for concepts with insufficient information to code with codable children: Secondary | ICD-10-CM | POA: Insufficient documentation

## 2013-05-23 NOTE — Progress Notes (Signed)
[facility]  NAME:  [name], [name] NO.:  [id number]  MEDICAL RECORD NO.:  [id number]      DATE OF BIRTH:  05/28/1946  PHYSICIAN:  Judene Companion, M.D.           VISIT DATE:                                  OFFICE VISIT   This is a 67 year old morbidly obese female, who is on dialysis and has a history of stroke and congestive heart failure and renal failure.  She is on dialysis.  She has also had a hip replacement on her right.  She has had some sort of band operation on her stomach for weight loss.  She also has had a cholecystectomy. She has gout, CVA, hypertension, and a history of asthma.  Today she came here because she had an abscess in her right axilla which we debrided and drained, and we will treat it with silver alginate and I believe it will take care of herself very quickly.  She will return here in a week for a recheck.     Judene Companion, M.D.     PP/MEDQ  D:  05/22/2013  T:  05/23/2013  Job:  [id number]

## 2013-05-28 ENCOUNTER — Encounter: Payer: Self-pay | Admitting: Cardiology

## 2013-05-28 ENCOUNTER — Ambulatory Visit (INDEPENDENT_AMBULATORY_CARE_PROVIDER_SITE_OTHER): Payer: Medicare Other | Admitting: Cardiology

## 2013-05-28 VITALS — BP 132/80 | HR 112 | Ht 65.75 in | Wt 214.0 lb

## 2013-05-28 DIAGNOSIS — G4733 Obstructive sleep apnea (adult) (pediatric): Secondary | ICD-10-CM

## 2013-05-28 DIAGNOSIS — I4891 Unspecified atrial fibrillation: Secondary | ICD-10-CM

## 2013-05-28 DIAGNOSIS — I428 Other cardiomyopathies: Secondary | ICD-10-CM

## 2013-05-28 DIAGNOSIS — I420 Dilated cardiomyopathy: Secondary | ICD-10-CM

## 2013-05-28 DIAGNOSIS — I10 Essential (primary) hypertension: Secondary | ICD-10-CM

## 2013-05-28 MED ORDER — DILTIAZEM HCL ER COATED BEADS 240 MG PO CP24: 240.0000 mg | ORAL_CAPSULE | Freq: Every day | ORAL | Status: DC

## 2013-05-28 MED ORDER — METOPROLOL TARTRATE 50 MG PO TABS: 50.0000 mg | ORAL_TABLET | Freq: Two times a day (BID) | ORAL | Status: DC

## 2013-05-28 NOTE — Patient Instructions (Signed)
I sent in refills for you medications as requested  Your physician recommends that you continue on your current medications as directed. Please refer to the Current Medication list given to you today.  Your physician wants you to follow-up in: 6 Months with Dr. Mallie Snooks will receive a reminder letter in the mail two months in advance. If you don't receive a letter, please call our office to schedule the follow-up appointment.  We contacted [facility], they will be calling you within the next week to get a download of your CPAP machine.

## 2013-05-28 NOTE — Progress Notes (Signed)
[address], [place], [place]  [address] Phone: [phone] Fax:  [phone]  Date:  05/28/2013   ID:  IYSIS ABEGG, DOB 06/07/1946, MRN [id number]  PCP:  Garnet Koyanagi, DO  Cardiologist:  Fransico Him, MD   CC:  6 months followup   History of Present Illness: [name] is a 67 y.o. female with a history of hypertensive DCM, HTN, OSA, morbid obesity and PAF who presents today for followup.  She is doing well.  She denies any chest pain, SOB, DOE, dizziness or syncope.  She has been walking more without SOB.  Occasionally she will have some skipped heart beats.   Wt Readings from Last 3 Encounters:  05/28/13 214 lb (97.07 kg)  05/14/13 225 lb (102.059 kg)  05/08/13 220 lb (99.791 kg)     Past Medical History  Diagnosis Date  . Fractures, stress     in both feet--6 OR 7 YRS AGO--RESOLVED  . 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 Gout     doesn't require meds   . Anemia   . Blood transfusion     never had a reaction to blood transfusion  . Cardiomyopathy Mar 2013    Mild, EF 50-55% by Mar 2013 ECHO, diast dysfxn II  . Hypercalcemia     09/10/11  . HCAP (healthcare-associated pneumonia)     09/10/11  . Asthma     as a child  . Eczema   . Constipation     takes Miralax daily  . Oligouria   . Depression     takes Zoloft daily  . Peripheral vascular disease   . DVT of lower extremity, bilateral 12/21/11    "they're there now; been there for 2 wks"  . Sleep apnea     HAS CPAP-sleep study done 2007  . ESRD (end stage renal disease) on dialysis 09/2011    Started dialysis Mar 2013 on M/W/F on [place]  . Arthritis     HIP  . Morbid obesity   . PAF (paroxysmal atrial fibrillation)      HX OF CEREBRAL BLEED WHILE ON COUMADIN-SO PT NOT ON ANY BLOOD THINNERS NOW  . Hypertension     takes Diltiazem daily   . Stroke 2009    denies residual, hemorrhagic now off coumadin  . Nonischemic dilated cardiomyopathy     Current Outpatient Prescriptions    Medication Sig Dispense Refill  . calcium acetate (PHOSLO) 667 MG capsule Take 667 mg by mouth 3 (three) times daily.       Marland Kitchen diltiazem (CARDIZEM CD) 240 MG 24 hr capsule Take 240 mg by mouth daily after breakfast.       . docusate sodium (COLACE) 100 MG capsule Take 100 mg by mouth daily.      . folic acid-vitamin b complex-vitamin c-selenium-zinc (DIALYVITE) 3 MG TABS Take 1 tablet by mouth daily.      . sertraline (ZOLOFT) 50 MG tablet Take 1 tablet (50 mg total) by mouth every morning. Take before breakfast  90 tablet  3  . metoprolol (LOPRESSOR) 50 MG tablet Take 50 mg by mouth 2 (two) times daily. Hold on mornings of dialysis (M,W,F)      . [DISCONTINUED] colchicine 0.6 MG tablet Take 1 tablet (0.6 mg total) by mouth daily.  31 tablet  0  . [DISCONTINUED] furosemide (LASIX) 80 MG tablet Take 80-160 mg by mouth 2 (two) times daily. 2 tab in am and 1 tab      . [  DISCONTINUED] gabapentin (NEURONTIN) 300 MG capsule Take 300 mg by mouth 3 (three) times daily.       . [DISCONTINUED] metoprolol (TOPROL XL) 100 MG 24 hr tablet Take 100 mg by mouth daily before breakfast.       . [DISCONTINUED] potassium chloride SA (KLOR-CON M20) 20 MEQ tablet Take 20 mEq by mouth daily.       . [DISCONTINUED] rivaroxaban (XARELTO) 10 MG TABS tablet Take 1 tablet (10 mg total) by mouth daily.  14 tablet  0   No current facility-administered medications for this visit.    Allergies:    Allergies  Allergen Reactions  . Ace Inhibitors   . Warfarin Sodium Other (See Comments)    Caused her to have a stroke    Social History:  The patient  reports that she has never smoked. She has never used smokeless tobacco. She reports that she does not drink alcohol or use illicit drugs.   Family History:  The patient's family history includes Cancer in her father; Diabetes in her mother; Hodgkin's lymphoma (age of onset: 63) in an other family member; Hypertension in her mother.   ROS:  Please see the history of present  illness.      All other systems reviewed and negative.   PHYSICAL EXAM: VS:  BP 132/80  Pulse 112  Ht 5' 5.75" (1.67 m)  Wt 214 lb (97.07 kg)  BMI 34.81 kg/m2 Well nourished, well developed, in no acute distress HEENT: normal Neck: no JVD Cardiac:  normal S1, S2; RRR; no murmur Lungs:  clear to auscultation bilaterally, no wheezing, rhonchi or rales Abd: soft, nontender, no hepatomegaly Ext: no edema Skin: warm and dry Neuro:  CNs 2-12 intact, no focal abnormalities noted  EKG:     NSR  ASSESSMENT AND PLAN:  1. Hypertensive DCM - resolved 2. HTN - controlled  - continue metoprolol and diltiazem 3. PAF with no reoccurence   - continue diltiazem/Metoprolol 4. Obesity 5. OSA on CPAP  - I will get a dowload from her DME 6.  ESRD on HD currently being worked up for renal transplant - she has an echo pending next week for preop  Followup with me in 6 months  Signed, Fransico Him, MD 05/28/2013 11:19 AM

## 2013-06-04 ENCOUNTER — Ambulatory Visit (HOSPITAL_COMMUNITY): Payer: Medicare Other | Attending: Cardiovascular Disease | Admitting: Radiology

## 2013-06-04 ENCOUNTER — Encounter: Payer: Self-pay | Admitting: Cardiovascular Disease

## 2013-06-04 DIAGNOSIS — J45909 Unspecified asthma, uncomplicated: Secondary | ICD-10-CM | POA: Insufficient documentation

## 2013-06-04 DIAGNOSIS — Z6835 Body mass index (BMI) 35.0-35.9, adult: Secondary | ICD-10-CM | POA: Insufficient documentation

## 2013-06-04 DIAGNOSIS — Z992 Dependence on renal dialysis: Secondary | ICD-10-CM | POA: Insufficient documentation

## 2013-06-04 DIAGNOSIS — G4733 Obstructive sleep apnea (adult) (pediatric): Secondary | ICD-10-CM | POA: Insufficient documentation

## 2013-06-04 DIAGNOSIS — Z01818 Encounter for other preprocedural examination: Secondary | ICD-10-CM

## 2013-06-04 DIAGNOSIS — I428 Other cardiomyopathies: Secondary | ICD-10-CM | POA: Insufficient documentation

## 2013-06-04 DIAGNOSIS — I10 Essential (primary) hypertension: Secondary | ICD-10-CM | POA: Insufficient documentation

## 2013-06-04 DIAGNOSIS — Z8673 Personal history of transient ischemic attack (TIA), and cerebral infarction without residual deficits: Secondary | ICD-10-CM | POA: Insufficient documentation

## 2013-06-04 DIAGNOSIS — I4891 Unspecified atrial fibrillation: Secondary | ICD-10-CM | POA: Insufficient documentation

## 2013-06-04 DIAGNOSIS — Z01810 Encounter for preprocedural cardiovascular examination: Secondary | ICD-10-CM | POA: Insufficient documentation

## 2013-06-04 DIAGNOSIS — N289 Disorder of kidney and ureter, unspecified: Secondary | ICD-10-CM | POA: Insufficient documentation

## 2013-06-04 NOTE — Progress Notes (Signed)
Echocardiogram performed.  

## 2013-06-17 ENCOUNTER — Encounter: Payer: Self-pay | Admitting: General Surgery

## 2013-06-24 ENCOUNTER — Telehealth: Payer: Self-pay | Admitting: Cardiology

## 2013-06-24 NOTE — Telephone Encounter (Signed)
Refaxed signed letter to [phone] for pt. Made aware sending today and also sent 06/17/13 for the pt.

## 2013-06-24 NOTE — Telephone Encounter (Signed)
New problem   Need note stating pt is clear to have kidney transplant. Please call

## 2013-07-08 ENCOUNTER — Other Ambulatory Visit: Payer: Self-pay | Admitting: Family Medicine

## 2013-07-13 ENCOUNTER — Other Ambulatory Visit: Payer: Self-pay | Admitting: Family Medicine

## 2013-07-13 DIAGNOSIS — Z1231 Encounter for screening mammogram for malignant neoplasm of breast: Secondary | ICD-10-CM

## 2013-07-17 DIAGNOSIS — N186 End stage renal disease: Secondary | ICD-10-CM | POA: Diagnosis not present

## 2013-07-24 ENCOUNTER — Ambulatory Visit (HOSPITAL_COMMUNITY)
Admission: RE | Admit: 2013-07-24 | Discharge: 2013-07-24 | Disposition: A | Discharge status: Home / Self Care | Payer: Medicare Other | Source: Ambulatory Visit | Attending: Family Medicine | Admitting: Family Medicine

## 2013-07-24 DIAGNOSIS — Z1231 Encounter for screening mammogram for malignant neoplasm of breast: Secondary | ICD-10-CM | POA: Diagnosis not present

## 2013-07-24 LAB — HM MAMMOGRAPHY: HM Mammogram: INCREASED

## 2013-07-29 ENCOUNTER — Other Ambulatory Visit: Payer: Self-pay | Admitting: Family Medicine

## 2013-07-29 DIAGNOSIS — R928 Other abnormal and inconclusive findings on diagnostic imaging of breast: Secondary | ICD-10-CM

## 2013-08-03 ENCOUNTER — Ambulatory Visit
Admission: RE | Admit: 2013-08-03 | Discharge: 2013-08-03 | Disposition: A | Discharge status: Home / Self Care | Payer: Medicare Other | Source: Ambulatory Visit | Attending: Family Medicine | Admitting: Family Medicine

## 2013-08-03 DIAGNOSIS — R928 Other abnormal and inconclusive findings on diagnostic imaging of breast: Secondary | ICD-10-CM

## 2013-08-12 DIAGNOSIS — N186 End stage renal disease: Secondary | ICD-10-CM | POA: Diagnosis not present

## 2013-08-15 DIAGNOSIS — N186 End stage renal disease: Secondary | ICD-10-CM | POA: Diagnosis not present

## 2013-08-17 DIAGNOSIS — N2581 Secondary hyperparathyroidism of renal origin: Secondary | ICD-10-CM | POA: Diagnosis not present

## 2013-08-17 DIAGNOSIS — N186 End stage renal disease: Secondary | ICD-10-CM | POA: Diagnosis not present

## 2013-09-12 DIAGNOSIS — N186 End stage renal disease: Secondary | ICD-10-CM | POA: Diagnosis not present

## 2013-09-14 DIAGNOSIS — N186 End stage renal disease: Secondary | ICD-10-CM | POA: Diagnosis not present

## 2013-09-14 DIAGNOSIS — N2581 Secondary hyperparathyroidism of renal origin: Secondary | ICD-10-CM | POA: Diagnosis not present

## 2013-09-29 DIAGNOSIS — I871 Compression of vein: Secondary | ICD-10-CM | POA: Diagnosis not present

## 2013-09-29 DIAGNOSIS — T82898A Other specified complication of vascular prosthetic devices, implants and grafts, initial encounter: Secondary | ICD-10-CM | POA: Diagnosis not present

## 2013-09-29 DIAGNOSIS — N186 End stage renal disease: Secondary | ICD-10-CM | POA: Diagnosis not present

## 2013-10-13 DIAGNOSIS — N186 End stage renal disease: Secondary | ICD-10-CM | POA: Diagnosis not present

## 2013-10-14 DIAGNOSIS — N186 End stage renal disease: Secondary | ICD-10-CM | POA: Diagnosis not present

## 2013-10-14 DIAGNOSIS — N2581 Secondary hyperparathyroidism of renal origin: Secondary | ICD-10-CM | POA: Diagnosis not present

## 2013-10-27 ENCOUNTER — Other Ambulatory Visit: Payer: Self-pay | Admitting: Family Medicine

## 2013-11-03 ENCOUNTER — Telehealth (HOSPITAL_COMMUNITY): Payer: Self-pay

## 2013-11-03 NOTE — Telephone Encounter (Signed)
This patient is overdue for recommended follow-up with a bariatric surgeon at [facility]. Call attempted today to reestablish post-op care with [facility], but unable to reach patient by phone.  A letter will be mailed to the patient today to the address on file from [place] [facility] advising the patient on the benefits of follow-up care and directing them to call [facility] at [phone] to schedule an appointment at their earliest convenience.  ° °Amanda T. Fleming °Bariatric Office Coordinator °[phone] ° °

## 2013-11-06 DIAGNOSIS — N186 End stage renal disease: Secondary | ICD-10-CM | POA: Diagnosis not present

## 2013-11-06 DIAGNOSIS — N2581 Secondary hyperparathyroidism of renal origin: Secondary | ICD-10-CM | POA: Diagnosis not present

## 2013-11-12 DIAGNOSIS — N186 End stage renal disease: Secondary | ICD-10-CM | POA: Diagnosis not present

## 2013-11-13 DIAGNOSIS — N2581 Secondary hyperparathyroidism of renal origin: Secondary | ICD-10-CM | POA: Diagnosis not present

## 2013-11-13 DIAGNOSIS — N186 End stage renal disease: Secondary | ICD-10-CM | POA: Diagnosis not present

## 2013-11-13 DIAGNOSIS — I429 Cardiomyopathy, unspecified: Secondary | ICD-10-CM | POA: Diagnosis not present

## 2013-12-01 ENCOUNTER — Ambulatory Visit (INDEPENDENT_AMBULATORY_CARE_PROVIDER_SITE_OTHER): Payer: Medicare Other | Admitting: Cardiology

## 2013-12-01 ENCOUNTER — Encounter: Payer: Self-pay | Admitting: Cardiology

## 2013-12-01 VITALS — BP 110/60 | HR 78 | Ht 65.75 in | Wt 233.0 lb

## 2013-12-01 DIAGNOSIS — G4733 Obstructive sleep apnea (adult) (pediatric): Secondary | ICD-10-CM | POA: Diagnosis not present

## 2013-12-01 DIAGNOSIS — I10 Essential (primary) hypertension: Secondary | ICD-10-CM

## 2013-12-01 DIAGNOSIS — I428 Other cardiomyopathies: Secondary | ICD-10-CM

## 2013-12-01 DIAGNOSIS — I4891 Unspecified atrial fibrillation: Secondary | ICD-10-CM

## 2013-12-01 DIAGNOSIS — I420 Dilated cardiomyopathy: Secondary | ICD-10-CM

## 2013-12-01 DIAGNOSIS — Z9989 Dependence on other enabling machines and devices: Secondary | ICD-10-CM

## 2013-12-01 NOTE — Progress Notes (Signed)
[address] [place], [place]  [address] Phone: [phone] Fax:  [phone]  Date:  12/01/2013   ID:  LUCKY THAKKER, DOB 03-01-1946, MRN [id number]  PCP:  Garnet Koyanagi, DO  Cardiologist:  Fransico Him, MD     History of Present Illness: [name] is a 68 y.o. female with a history of hypertensive DCM, HTN, OSA, morbid obesity and PAF who presents today for followup. She is doing well. She denies any chest pain, SOB, DOE (except with extreme exertion). She has had some problems with dizziness and low BP at HD.   She has a history of OSA and has been on CPAP but has not been using it and says she has no reason for not using it.  She does not like her mask which is a full face mask.  She feels the pressure is fine.  She is complaining of increased fatigue.  She was noted to have some episodes of rapid afib at HD that is very intermittent.  Wt Readings from Last 3 Encounters:  12/01/13 233 lb (105.688 kg)  05/28/13 214 lb (97.07 kg)  05/14/13 225 lb (102.059 kg)     Past Medical History  Diagnosis Date  . Fractures, stress     in both feet--6 OR 7 YRS AGO--RESOLVED  . 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 Gout     doesn't require meds   . Anemia   . Blood transfusion     never had a reaction to blood transfusion  . Cardiomyopathy Mar 2013    Mild, EF 50-55% by Mar 2013 ECHO, diast dysfxn II  . Hypercalcemia     09/10/11  . HCAP (healthcare-associated pneumonia)     09/10/11  . Asthma     as a child  . Eczema   . Constipation     takes Miralax daily  . Oligouria   . Depression     takes Zoloft daily  . Peripheral vascular disease   . DVT of lower extremity, bilateral 12/21/11    "they're there now; been there for 2 wks"  . Sleep apnea     HAS CPAP-sleep study done 2007--PT NOT USING AT PRESENT  . ESRD (end stage renal disease) on dialysis 09/2011    Started dialysis Mar 2013 on M/W/F on [place]  . Arthritis     HIP  . Morbid obesity   . PAF  (paroxysmal atrial fibrillation)      HX OF CEREBRAL BLEED WHILE ON COUMADIN-SO PT NOT ON ANY BLOOD THINNERS NOW  . Hypertension     takes Diltiazem daily   . Stroke 2009    denies residual, hemorrhagic now off coumadin  . Nonischemic dilated cardiomyopathy     Current Outpatient Prescriptions  Medication Sig Dispense Refill  . calcium acetate (PHOSLO) 667 MG capsule Take 667 mg by mouth 3 (three) times daily.       Marland Kitchen diltiazem (CARDIZEM CD) 240 MG 24 hr capsule Take 1 capsule (240 mg total) by mouth daily after breakfast.  30 capsule  11  . docusate sodium (COLACE) 100 MG capsule Take 100 mg by mouth daily as needed.       . folic acid-vitamin b complex-vitamin c-selenium-zinc (DIALYVITE) 3 MG TABS Take 1 tablet by mouth daily.      . metoprolol (LOPRESSOR) 50 MG tablet Take 1 tablet (50 mg total) by mouth 2 (two) times daily. Hold on mornings of dialysis (M,W,F)  60  tablet  11  . sertraline (ZOLOFT) 50 MG tablet 1 tab by mouth daily--office visit due now  90 tablet  0  . [DISCONTINUED] colchicine 0.6 MG tablet Take 1 tablet (0.6 mg total) by mouth daily.  31 tablet  0  . [DISCONTINUED] furosemide (LASIX) 80 MG tablet Take 80-160 mg by mouth 2 (two) times daily. 2 tab in am and 1 tab      . [DISCONTINUED] gabapentin (NEURONTIN) 300 MG capsule Take 300 mg by mouth 3 (three) times daily.       . [DISCONTINUED] metoprolol (TOPROL XL) 100 MG 24 hr tablet Take 100 mg by mouth daily before breakfast.       . [DISCONTINUED] potassium chloride SA (KLOR-CON M20) 20 MEQ tablet Take 20 mEq by mouth daily.       . [DISCONTINUED] rivaroxaban (XARELTO) 10 MG TABS tablet Take 1 tablet (10 mg total) by mouth daily.  14 tablet  0   No current facility-administered medications for this visit.    Allergies:    Allergies  Allergen Reactions  . Ace Inhibitors   . Warfarin Sodium Other (See Comments)    Caused her to have a stroke    Social History:  The patient  reports that she has never smoked. She  has never used smokeless tobacco. She reports that she does not drink alcohol or use illicit drugs.   Family History:  The patient's family history includes Cancer in her father; Diabetes in her mother; Hodgkin's lymphoma (age of onset: 104) in an other family member; Hypertension in her mother.   ROS:  Please see the history of present illness.      All other systems reviewed and negative.   PHYSICAL EXAM: VS:  BP 110/60  Pulse 78  Ht 5' 5.75" (1.67 m)  Wt 233 lb (105.688 kg)  BMI 37.90 kg/m2 Well nourished, well developed, in no acute distress HEENT: normal Neck: no JVD Cardiac:  normal S1, S2; RRR; no murmur Lungs:  clear to auscultation bilaterally, no wheezing, rhonchi or rales Abd: soft, nontender, no hepatomegaly Ext: no edema Skin: warm and dry Neuro:  CNs 2-12 intact, no focal abnormalities noted  EKG:  NSR with frequent PAC's , IRBBB, IRBBB   ASSESSMENT AND PLAN:  1. Hypertensive DCM - resolved 2. HTN - controlled - continue metoprolol and diltiazem        3.  PAF but apparently having episodes that are short lived at HD - event monitor to assess if she is having PAF or just frequent PAC's - continue diltiazem/Metoprolol  4. Obesity 5. OSA on CPAP - I have encouraged her to try to get back on her CPAP       6. ESRD on HD currently being worked up for renal transplant  Followup with me in 6 months   Signed, Fransico Him, MD 12/01/2013 4:06 PM

## 2013-12-01 NOTE — Patient Instructions (Addendum)
Your physician recommends that you continue on your current medications as directed. Please refer to the Current Medication list given to you today.   Your physician has recommended that you wear an event monitor. Event monitors are medical devices that record the heart's electrical activity. Doctors most often [name] these monitors to diagnose arrhythmias. Arrhythmias are problems with the speed or rhythm of the heartbeat. The monitor is a small, portable device. You can wear one while you do your normal daily activities. This is usually used to diagnose what is causing palpitations/syncope (passing out).    Your physician wants you to follow-up in: 6 months with Dr Mallie Snooks will receive a reminder letter in the mail two months in advance. If you don't receive a letter, please call our office to schedule the follow-up appointment.

## 2013-12-03 ENCOUNTER — Encounter: Payer: Self-pay | Admitting: *Deleted

## 2013-12-03 ENCOUNTER — Encounter: Payer: Self-pay | Admitting: Cardiology

## 2013-12-03 ENCOUNTER — Encounter (INDEPENDENT_AMBULATORY_CARE_PROVIDER_SITE_OTHER): Payer: Medicare Other

## 2013-12-03 DIAGNOSIS — I4891 Unspecified atrial fibrillation: Secondary | ICD-10-CM | POA: Diagnosis not present

## 2013-12-03 NOTE — Progress Notes (Signed)
Patient ID: [name], female   DOB: 11-23-1945, 68 y.o.   MRN: [id number] [employer] 30 day cardiac event monitor applied to patient.

## 2013-12-13 DIAGNOSIS — N186 End stage renal disease: Secondary | ICD-10-CM | POA: Diagnosis not present

## 2013-12-14 DIAGNOSIS — N2581 Secondary hyperparathyroidism of renal origin: Secondary | ICD-10-CM | POA: Diagnosis not present

## 2013-12-14 DIAGNOSIS — N186 End stage renal disease: Secondary | ICD-10-CM | POA: Diagnosis not present

## 2014-01-06 ENCOUNTER — Telehealth: Payer: Self-pay | Admitting: Cardiology

## 2014-01-06 NOTE — Telephone Encounter (Signed)
Please let patient know that heart monitor showed NSR with frequent PAC's that are benign.  No atrial fibrillation noted

## 2014-01-06 NOTE — Telephone Encounter (Signed)
Pt is aware. Forwarded to Dr Mercy Moore as requested by pt

## 2014-01-12 DIAGNOSIS — N186 End stage renal disease: Secondary | ICD-10-CM | POA: Diagnosis not present

## 2014-01-13 DIAGNOSIS — N186 End stage renal disease: Secondary | ICD-10-CM | POA: Diagnosis not present

## 2014-01-13 DIAGNOSIS — N2581 Secondary hyperparathyroidism of renal origin: Secondary | ICD-10-CM | POA: Diagnosis not present

## 2014-02-10 ENCOUNTER — Other Ambulatory Visit: Payer: Self-pay | Admitting: Family Medicine

## 2014-02-12 DIAGNOSIS — N186 End stage renal disease: Secondary | ICD-10-CM | POA: Diagnosis not present

## 2014-02-15 DIAGNOSIS — N186 End stage renal disease: Secondary | ICD-10-CM | POA: Diagnosis not present

## 2014-02-15 DIAGNOSIS — N2581 Secondary hyperparathyroidism of renal origin: Secondary | ICD-10-CM | POA: Diagnosis not present

## 2014-02-23 DIAGNOSIS — N186 End stage renal disease: Secondary | ICD-10-CM | POA: Diagnosis not present

## 2014-02-23 DIAGNOSIS — E878 Other disorders of electrolyte and fluid balance, not elsewhere classified: Secondary | ICD-10-CM | POA: Diagnosis not present

## 2014-02-25 ENCOUNTER — Other Ambulatory Visit: Payer: Self-pay

## 2014-02-25 MED ORDER — DILTIAZEM HCL ER COATED BEADS 240 MG PO CP24: 240.0000 mg | ORAL_CAPSULE | Freq: Every day | ORAL | Status: DC

## 2014-03-02 ENCOUNTER — Ambulatory Visit (INDEPENDENT_AMBULATORY_CARE_PROVIDER_SITE_OTHER): Payer: Medicare Other | Admitting: Family Medicine

## 2014-03-02 ENCOUNTER — Encounter: Payer: Self-pay | Admitting: Family Medicine

## 2014-03-02 VITALS — BP 110/68 | HR 88 | Temp 97.9°F | Wt 230.0 lb

## 2014-03-02 DIAGNOSIS — F329 Major depressive disorder, single episode, unspecified: Secondary | ICD-10-CM

## 2014-03-02 DIAGNOSIS — F3289 Other specified depressive episodes: Secondary | ICD-10-CM | POA: Diagnosis not present

## 2014-03-02 DIAGNOSIS — I10 Essential (primary) hypertension: Secondary | ICD-10-CM

## 2014-03-02 DIAGNOSIS — Z23 Encounter for immunization: Secondary | ICD-10-CM | POA: Diagnosis not present

## 2014-03-02 DIAGNOSIS — F32A Depression, unspecified: Secondary | ICD-10-CM

## 2014-03-02 MED ORDER — DILTIAZEM HCL ER COATED BEADS 240 MG PO CP24: 240.0000 mg | ORAL_CAPSULE | Freq: Every day | ORAL | Status: DC

## 2014-03-02 MED ORDER — SERTRALINE HCL 50 MG PO TABS: ORAL_TABLET | ORAL | Status: DC

## 2014-03-02 MED ORDER — NONFORMULARY OR COMPOUNDED ITEM: Status: DC

## 2014-03-02 MED ORDER — ZOSTER VACCINE LIVE 19400 UNT/0.65ML ~~LOC~~ SOLR: 0.6500 mL | Freq: Once | SUBCUTANEOUS | Status: DC

## 2014-03-02 NOTE — Patient Instructions (Signed)

## 2014-03-02 NOTE — Progress Notes (Signed)
Pre visit review using our clinic review tool, if applicable. No additional management support is needed unless otherwise documented below in the visit note. 

## 2014-03-02 NOTE — Progress Notes (Signed)
  Subjective:    Patient here for follow-up of elevated blood pressure.  She is not exercising and is adherent to a low-salt diet.  Blood pressure is well controlled at home. Cardiac symptoms: none. Patient denies: chest pain, chest pressure/discomfort, claudication, dyspnea, exertional chest pressure/discomfort, fatigue, irregular heart beat, lower extremity edema, near-syncope, orthopnea, palpitations, paroxysmal nocturnal dyspnea, syncope and tachypnea. Cardiovascular risk factors: advanced age (older than 65 for men, 78 for women), dyslipidemia, hypertension, obesity (BMI >= 30 kg/m2) and sedentary lifestyle. Use of agents associated with hypertension: none. History of target organ damage: chronic kidney disease.  The following portions of the patient's history were reviewed and updated as appropriate: allergies, current medications, past family history, past medical history, past social history, past surgical history and problem list.  Review of Systems Pertinent items are noted in HPI.     Objective:    BP 110/68  Pulse 88  Temp(Src) 97.9 F (36.6 C) (Oral)  Wt 230 lb (104.327 kg)  SpO2 96% General appearance: alert, cooperative, appears stated age and no distress Neck: no adenopathy, no carotid bruit, no JVD, supple, symmetrical, trachea midline and thyroid not enlarged, symmetric, no tenderness/mass/nodules Lungs: clear to auscultation bilaterally Heart: S1, S2 normal Extremities: extremities normal, atraumatic, no cyanosis or edema    Assessment:    Hypertension, normal blood pressure . Evidence of target organ damage: chronic kidney disease.    Plan:    Medication: no change. Regular aerobic exercise. Check blood pressures 2-3 times weekly and record. Follow up: 6 months and as needed.   1. Essential hypertension con't meds - diltiazem (CARDIZEM CD) 240 MG 24 hr capsule; Take 1 capsule (240 mg total) by mouth daily after breakfast.  Dispense: 90 capsule; Refill: 3  2.  Depression stable - sertraline (ZOLOFT) 50 MG tablet; 1 tab by mouth daily---  Dispense: 90 tablet; Refill: 3  3. Need for shingles vaccine   - zoster vaccine live, PF, (ZOSTAVAX) 24401 UNT/0.65ML injection; Inject 19,400 Units into the skin once.  Dispense: 1 vial; Refill: 0  4. Need for pneumococcal vaccination   - Pneumococcal conjugate vaccine 13-valent

## 2014-03-03 ENCOUNTER — Telehealth: Payer: Self-pay | Admitting: Family Medicine

## 2014-03-03 NOTE — Telephone Encounter (Signed)
Relevant patient education assigned to patient using Emmi. ° °

## 2014-03-12 DIAGNOSIS — N186 End stage renal disease: Secondary | ICD-10-CM | POA: Diagnosis not present

## 2014-03-15 DIAGNOSIS — N186 End stage renal disease: Secondary | ICD-10-CM | POA: Diagnosis not present

## 2014-03-17 DIAGNOSIS — N186 End stage renal disease: Secondary | ICD-10-CM | POA: Diagnosis not present

## 2014-03-17 DIAGNOSIS — D509 Iron deficiency anemia, unspecified: Secondary | ICD-10-CM | POA: Diagnosis not present

## 2014-04-02 DIAGNOSIS — D631 Anemia in chronic kidney disease: Secondary | ICD-10-CM | POA: Diagnosis not present

## 2014-04-02 DIAGNOSIS — N186 End stage renal disease: Secondary | ICD-10-CM | POA: Diagnosis not present

## 2014-04-02 DIAGNOSIS — N039 Chronic nephritic syndrome with unspecified morphologic changes: Secondary | ICD-10-CM | POA: Diagnosis not present

## 2014-04-14 DIAGNOSIS — N186 End stage renal disease: Secondary | ICD-10-CM | POA: Diagnosis not present

## 2014-04-16 DIAGNOSIS — D509 Iron deficiency anemia, unspecified: Secondary | ICD-10-CM | POA: Diagnosis not present

## 2014-04-16 DIAGNOSIS — N186 End stage renal disease: Secondary | ICD-10-CM | POA: Diagnosis not present

## 2014-04-16 DIAGNOSIS — Z23 Encounter for immunization: Secondary | ICD-10-CM | POA: Diagnosis not present

## 2014-04-27 DIAGNOSIS — Z96641 Presence of right artificial hip joint: Secondary | ICD-10-CM | POA: Diagnosis not present

## 2014-04-27 DIAGNOSIS — M25551 Pain in right hip: Secondary | ICD-10-CM | POA: Diagnosis not present

## 2014-05-05 ENCOUNTER — Telehealth: Payer: Self-pay | Admitting: Cardiology

## 2014-05-05 NOTE — Telephone Encounter (Signed)
New message          Pt is on kidney transplant list and needs an echo and gxt / Can you put orders in for this?

## 2014-05-05 NOTE — Telephone Encounter (Signed)
Spoke with patient and she will patient and she will bring in the order for echo and gxt.

## 2014-05-06 ENCOUNTER — Telehealth: Payer: Self-pay | Admitting: Cardiology

## 2014-05-06 NOTE — Telephone Encounter (Signed)
Walk in pt Form " paper Dropped Off by pt" gave to [facility]

## 2014-05-15 DIAGNOSIS — Z992 Dependence on renal dialysis: Secondary | ICD-10-CM | POA: Diagnosis not present

## 2014-05-15 DIAGNOSIS — N186 End stage renal disease: Secondary | ICD-10-CM | POA: Diagnosis not present

## 2014-05-17 DIAGNOSIS — N2581 Secondary hyperparathyroidism of renal origin: Secondary | ICD-10-CM | POA: Diagnosis not present

## 2014-05-17 DIAGNOSIS — N186 End stage renal disease: Secondary | ICD-10-CM | POA: Diagnosis not present

## 2014-05-17 NOTE — Telephone Encounter (Signed)
Yes but patient needs to bring in orders

## 2014-05-17 NOTE — Telephone Encounter (Signed)
Follow up     Patient calling back to speak with nurse regarding kidney transplant in [name] .

## 2014-05-17 NOTE — Telephone Encounter (Signed)
Spoke with patient and advised re-faxed last echo and myoview to pre Transplant coordinator, Caroll Rancher RN to see if both or either needed to be repeated.  Patient to follow up with [facility] and call back if anything further needs to be done.

## 2014-05-17 NOTE — Telephone Encounter (Signed)
Spoke with patient who states she brought paperwork "attn: Rip Harbour" last week that included testing she needs completed.  Patient st she is on the transplant list and needs a stress test and an ECHO to remain on the list.  Informed patient I would F/U with her tomorrow after speaking with Rip Harbour and Dr. Radford Pax.

## 2014-05-27 ENCOUNTER — Ambulatory Visit: Payer: No Typology Code available for payment source | Admitting: Cardiology

## 2014-05-28 ENCOUNTER — Other Ambulatory Visit: Payer: Self-pay | Admitting: Cardiology

## 2014-06-14 DIAGNOSIS — N186 End stage renal disease: Secondary | ICD-10-CM | POA: Diagnosis not present

## 2014-06-14 DIAGNOSIS — Z992 Dependence on renal dialysis: Secondary | ICD-10-CM | POA: Diagnosis not present

## 2014-06-16 DIAGNOSIS — N186 End stage renal disease: Secondary | ICD-10-CM | POA: Diagnosis not present

## 2014-06-16 DIAGNOSIS — N2581 Secondary hyperparathyroidism of renal origin: Secondary | ICD-10-CM | POA: Diagnosis not present

## 2014-06-17 ENCOUNTER — Encounter: Payer: Self-pay | Admitting: Cardiology

## 2014-06-17 ENCOUNTER — Ambulatory Visit (INDEPENDENT_AMBULATORY_CARE_PROVIDER_SITE_OTHER): Payer: Medicare Other | Admitting: Cardiology

## 2014-06-17 VITALS — BP 128/62 | HR 105 | Ht 66.0 in | Wt 232.0 lb

## 2014-06-17 DIAGNOSIS — I480 Paroxysmal atrial fibrillation: Secondary | ICD-10-CM

## 2014-06-17 DIAGNOSIS — I429 Cardiomyopathy, unspecified: Secondary | ICD-10-CM

## 2014-06-17 DIAGNOSIS — I10 Essential (primary) hypertension: Secondary | ICD-10-CM | POA: Diagnosis not present

## 2014-06-17 DIAGNOSIS — I4891 Unspecified atrial fibrillation: Secondary | ICD-10-CM

## 2014-06-17 DIAGNOSIS — N186 End stage renal disease: Secondary | ICD-10-CM | POA: Diagnosis not present

## 2014-06-17 DIAGNOSIS — G4733 Obstructive sleep apnea (adult) (pediatric): Secondary | ICD-10-CM | POA: Diagnosis not present

## 2014-06-17 DIAGNOSIS — I420 Dilated cardiomyopathy: Secondary | ICD-10-CM

## 2014-06-17 DIAGNOSIS — Z9989 Dependence on other enabling machines and devices: Principal | ICD-10-CM

## 2014-06-17 NOTE — Progress Notes (Signed)
[address], [name], [name]  [address] Phone: [phone] Fax:  [phone]  Date:  06/17/2014   ID:  [name] Mar 13, 1946, MRN [id number]  PCP:  Jane Koyanagi, DO  Cardiologist:  Jane Him, MD    History of Present Illness: [name] is a 68 y.o. female with a history of hypertensive DCM, HTN, OSA, morbid obesity and PAF who presents today for followup. She is doing well. She denies any chest pain, SOB, DOE (except with extreme exertion such as a long walk). She denies any LE edema, palpitations or syncope.  She has a history of OSA and has been on CPAP but when I saw her last she had not been using it because she did not like her mask which is a full face mask. She is now back on the CPAP and is using a nasal mask.  She feels the pressure is fine.  Wt Readings from Last 3 Encounters:  06/17/14 232 lb (105.235 kg)  03/02/14 230 lb (104.327 kg)  12/01/13 233 lb (105.688 kg)     Past Medical History  Diagnosis Date  . Fractures, stress     in both feet--6 OR 7 YRS AGO--RESOLVED  . 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 Gout     doesn't require meds   . Anemia   . Blood transfusion     never had a reaction to blood transfusion  . Cardiomyopathy Mar 2013    Mild, EF 50-55% by Mar 2013 ECHO, diast dysfxn II  . Hypercalcemia     09/10/11  . HCAP (healthcare-associated pneumonia)     09/10/11  . Asthma     as a child  . Eczema   . Constipation     takes Miralax daily  . Oligouria   . Depression     takes Zoloft daily  . Peripheral vascular disease   . DVT of lower extremity, bilateral 12/21/11    "they're there now; been there for 2 wks"  . Sleep apnea     HAS CPAP-sleep study done 2007--PT NOT USING AT PRESENT  . ESRD (end stage renal disease) on dialysis 09/2011    Started dialysis Mar 2013 on M/W/F on [place]  . Arthritis     HIP  . Morbid obesity   . PAF (paroxysmal atrial fibrillation)      HX OF CEREBRAL BLEED WHILE ON  COUMADIN-SO PT NOT ON ANY BLOOD THINNERS NOW  . Hypertension     takes Diltiazem daily   . Stroke 2009    denies residual, hemorrhagic now off coumadin  . Nonischemic dilated cardiomyopathy     Current Outpatient Prescriptions  Medication Sig Dispense Refill  . calcium acetate (PHOSLO) 667 MG capsule Take 667 mg by mouth 3 (three) times daily.     Marland Kitchen diltiazem (CARDIZEM CD) 240 MG 24 hr capsule Take 1 capsule (240 mg total) by mouth daily after breakfast. 90 capsule 3  . docusate sodium (COLACE) 100 MG capsule Take 100 mg by mouth daily as needed.     . folic acid-vitamin b complex-vitamin c-selenium-zinc (DIALYVITE) 3 MG TABS Take 1 tablet by mouth daily.    . sertraline (ZOLOFT) 50 MG tablet 1 tab by mouth daily--- 90 tablet 3  . zoster vaccine live, PF, (ZOSTAVAX) 57846 UNT/0.65ML injection Inject 19,400 Units into the skin once. 1 vial 0  . [DISCONTINUED] colchicine 0.6 MG tablet Take 1 tablet (0.6 mg total) by mouth daily.  31 tablet 0  . [DISCONTINUED] furosemide (LASIX) 80 MG tablet Take 80-160 mg by mouth 2 (two) times daily. 2 tab in am and 1 tab    . [DISCONTINUED] gabapentin (NEURONTIN) 300 MG capsule Take 300 mg by mouth 3 (three) times daily.     . [DISCONTINUED] metoprolol (TOPROL XL) 100 MG 24 hr tablet Take 100 mg by mouth daily before breakfast.     . [DISCONTINUED] potassium chloride SA (KLOR-CON M20) 20 MEQ tablet Take 20 mEq by mouth daily.     . [DISCONTINUED] rivaroxaban (XARELTO) 10 MG TABS tablet Take 1 tablet (10 mg total) by mouth daily. 14 tablet 0   No current facility-administered medications for this visit.    Allergies:    Allergies  Allergen Reactions  . Ace Inhibitors   . Warfarin Sodium Other (See Comments)    Caused her to have a stroke    Social History:  The patient  reports that she has never smoked. She has never used smokeless tobacco. She reports that she does not drink alcohol or use illicit drugs.   Family History:  The patient's family  history includes Cancer in her father; Diabetes in her mother; Hodgkin's lymphoma (age of onset: 9) in an other family member; Hypertension in her mother.   ROS:  Please see the history of present illness.      All other systems reviewed and negative.   PHYSICAL EXAM: VS:  BP 128/62 mmHg  Pulse 105  Ht 5\' 6"  (1.676 m)  Wt 232 lb (105.235 kg)  BMI 37.46 kg/m2 Well nourished, well developed, in no acute distress HEENT: normal Neck: no JVD Cardiac:  normal S1, S2; RRR; no murmur Lungs:  clear to auscultation bilaterally, no wheezing, rhonchi or rales Abd: soft, nontender, no hepatomegaly Ext: no edema Skin: warm and dry Neuro:  CNs 2-12 intact, no focal abnormalities noted  EKG:  NSR with PAC's      ASSESSMENT AND PLAN:  1. Hypertensive DCM - resolved 2. HTN - controlled - continue diltiazem   3. PAF - maintaining NSR.  No anticoagulation secondary to prior head bleed - continue diltiazem 4. Obesity 5. OSA on CPAP - I will get a d/l on her device  6. ESRD on HD currently being worked up for renal transplant  Followup with me in 6 months   Signed, Jane Him, MD [facility] 06/17/2014 3:17 PM

## 2014-06-17 NOTE — Patient Instructions (Signed)
Your physician wants you to follow-up in: 6 months with Dr. Radford Pax. You will receive a reminder letter in the mail two months in advance. If you don't receive a letter, please call our office to schedule the follow-up appointment.

## 2014-07-15 DIAGNOSIS — Z992 Dependence on renal dialysis: Secondary | ICD-10-CM | POA: Diagnosis not present

## 2014-07-15 DIAGNOSIS — N186 End stage renal disease: Secondary | ICD-10-CM | POA: Diagnosis not present

## 2014-07-17 DIAGNOSIS — N186 End stage renal disease: Secondary | ICD-10-CM | POA: Diagnosis not present

## 2014-07-17 DIAGNOSIS — N2581 Secondary hyperparathyroidism of renal origin: Secondary | ICD-10-CM | POA: Diagnosis not present

## 2014-08-03 ENCOUNTER — Telehealth: Payer: Self-pay

## 2014-08-03 DIAGNOSIS — Z7682 Awaiting organ transplant status: Secondary | ICD-10-CM

## 2014-08-03 NOTE — Telephone Encounter (Signed)
Spoke with Berdine Addison at Dr. Etheleen Nicks office to clarify stress test order. Per Dr. Radford Pax, patient is to have 2 day lexiscan myoview. Berdine Addison agrees, as patient is not very mobile. Order placed for scheduling.

## 2014-08-12 ENCOUNTER — Ambulatory Visit (HOSPITAL_COMMUNITY): Payer: Medicare Other | Attending: Interventional Cardiology | Admitting: Radiology

## 2014-08-12 DIAGNOSIS — Z7682 Awaiting organ transplant status: Secondary | ICD-10-CM

## 2014-08-12 DIAGNOSIS — I10 Essential (primary) hypertension: Secondary | ICD-10-CM | POA: Diagnosis not present

## 2014-08-12 DIAGNOSIS — I739 Peripheral vascular disease, unspecified: Secondary | ICD-10-CM | POA: Insufficient documentation

## 2014-08-12 DIAGNOSIS — I4891 Unspecified atrial fibrillation: Secondary | ICD-10-CM | POA: Diagnosis not present

## 2014-08-12 DIAGNOSIS — R0609 Other forms of dyspnea: Secondary | ICD-10-CM | POA: Diagnosis not present

## 2014-08-12 MED ORDER — REGADENOSON 0.4 MG/5ML IV SOLN
0.4000 mg | Freq: Once | INTRAVENOUS | Status: AC
  Administered 2014-08-12: 0.4 mg via INTRAVENOUS

## 2014-08-12 MED ORDER — TECHNETIUM TC 99M SESTAMIBI GENERIC - CARDIOLITE
33.0000 | Freq: Once | INTRAVENOUS | Status: AC | PRN
  Administered 2014-08-12: 33 via INTRAVENOUS

## 2014-08-12 NOTE — Progress Notes (Signed)
[facility] [address] [place], [place] [address] [phone]    Cardiology Nuclear Med Study  [name] is a 69 y.o. female     MRN : [id number]     DOB: 1946/01/08  Procedure Date: 08/12/2014  Nuclear Med Background Indication for Stress Test:  Evaluation for Ischemia History:  MPI 2014 (normal), asthma as a child, afib Cardiac Risk Factors: Hypertension and PVD  Symptoms:  DOE   Nuclear Pre-Procedure Caffeine/Decaff Intake:  None> 12 hrs NPO After: 7:30am   Lungs:  clear O2 Sat: 98% on room air. IV 0.9% NS with Angio Cath:  22g  IV Site: R Antecubital x 1, tolerated well IV Started by:  Crissie Figures, RN  Chest Size (in):  42 Cup Size: DD  Height: 5\' 6"  (1.676 m)  Weight:  240 lb (108.863 kg)  BMI:  Body mass index is 38.76 kg/(m^2). Tech Comments:  Diltiazem last night. Irven Baltimore, RN.    Nuclear Med Study 1 or 2 day study: 2 day  Stress Test Type:  Lexiscan  Reading MD: N/A  Order Authorizing Provider:  Fransico Him, MD  Resting Radionuclide: Technetium 63m Sestamibi  Resting Radionuclide Dose: 33.0 mCi on 08/16/2014  Stress Radionuclide:  Technetium 10m Sestamibi  Stress Radionuclide Dose: 33.0 mCi on 08/12/2014          Stress Protocol Rest HR: 83 Stress HR: 116  Rest BP: 116/80 Stress BP: 96/65  Exercise Time (min): n/a METS: n/a   Predicted Max HR: 154 bpm % Max HR: 75.32 bpm Rate Pressure Product: 13108   Dose of Adenosine (mg):  n/a Dose of Lexiscan: 0.4 mg  Dose of Atropine (mg): n/a Dose of Dobutamine: n/a mcg/kg/min (at max HR)  Stress Test Technologist: Glade Lloyd, BS-ES  Nuclear Technologist:  Earl Many, CNMT     Rest Procedure:  Myocardial perfusion imaging was performed at rest 45 minutes following the intravenous administration of Technetium 46m Sestamibi. Rest ECG: NSR, frequent PACs  Stress Procedure:  The patient received IV Lexiscan 0.4 mg over 15-seconds.  Technetium 38m Sestamibi injected at  30-seconds.  Quantitative spect images were obtained after a 45 minute delay.  During the infusion of Lexiscan the patient complained of feeling strange and had a warm feeling.  These symptoms resolved in recovery.  Stress ECG: No significant change from baseline ECG  QPS Raw Data Images:  Normal; no motion artifact; normal heart/lung ratio. Stress Images:  Normal homogeneous uptake in all areas of the myocardium. Rest Images:  Normal homogeneous uptake in all areas of the myocardium. Subtraction (SDS):  No evidence of ischemia. Transient Ischemic Dilatation (Normal <1.22):  1.15 Lung/Heart Ratio (Normal <0.45):  0.28  Quantitative Gated Spect Images QGS EDV:  NA ml QGS ESV:  NA ml  Impression Exercise Capacity:  Lexiscan with no exercise. BP Response:  Normal blood pressure response. Clinical Symptoms:  No significant symptoms noted. ECG Impression:  No significant ST segment change suggestive of ischemia. Comparison with Prior Nuclear Study: No previous nuclear study performed  Overall Impression:  Normal stress nuclear study.  LV Ejection Fraction: Study not gated.   Sanda Klein, MD, [facility] [phone] office [phone] pager

## 2014-08-15 DIAGNOSIS — Z992 Dependence on renal dialysis: Secondary | ICD-10-CM | POA: Diagnosis not present

## 2014-08-15 DIAGNOSIS — N186 End stage renal disease: Secondary | ICD-10-CM | POA: Diagnosis not present

## 2014-08-16 ENCOUNTER — Ambulatory Visit (HOSPITAL_COMMUNITY): Payer: Medicare Other | Attending: Cardiovascular Disease

## 2014-08-16 DIAGNOSIS — N2581 Secondary hyperparathyroidism of renal origin: Secondary | ICD-10-CM | POA: Diagnosis not present

## 2014-08-16 DIAGNOSIS — R0989 Other specified symptoms and signs involving the circulatory and respiratory systems: Secondary | ICD-10-CM

## 2014-08-16 DIAGNOSIS — N186 End stage renal disease: Secondary | ICD-10-CM | POA: Diagnosis not present

## 2014-08-16 MED ORDER — TECHNETIUM TC 99M SESTAMIBI GENERIC - CARDIOLITE
30.0000 | Freq: Once | INTRAVENOUS | Status: AC | PRN
  Administered 2014-08-16: 30 via INTRAVENOUS

## 2014-08-18 DIAGNOSIS — N186 End stage renal disease: Secondary | ICD-10-CM | POA: Diagnosis not present

## 2014-08-18 DIAGNOSIS — N2581 Secondary hyperparathyroidism of renal origin: Secondary | ICD-10-CM | POA: Diagnosis not present

## 2014-08-20 DIAGNOSIS — N2581 Secondary hyperparathyroidism of renal origin: Secondary | ICD-10-CM | POA: Diagnosis not present

## 2014-08-20 DIAGNOSIS — N186 End stage renal disease: Secondary | ICD-10-CM | POA: Diagnosis not present

## 2014-08-23 DIAGNOSIS — N2581 Secondary hyperparathyroidism of renal origin: Secondary | ICD-10-CM | POA: Diagnosis not present

## 2014-08-23 DIAGNOSIS — N186 End stage renal disease: Secondary | ICD-10-CM | POA: Diagnosis not present

## 2014-08-25 DIAGNOSIS — N2581 Secondary hyperparathyroidism of renal origin: Secondary | ICD-10-CM | POA: Diagnosis not present

## 2014-08-25 DIAGNOSIS — N186 End stage renal disease: Secondary | ICD-10-CM | POA: Diagnosis not present

## 2014-08-27 DIAGNOSIS — N2581 Secondary hyperparathyroidism of renal origin: Secondary | ICD-10-CM | POA: Diagnosis not present

## 2014-08-27 DIAGNOSIS — N186 End stage renal disease: Secondary | ICD-10-CM | POA: Diagnosis not present

## 2014-08-30 ENCOUNTER — Ambulatory Visit: Payer: Medicare Other | Admitting: Physician Assistant

## 2014-08-30 DIAGNOSIS — N2581 Secondary hyperparathyroidism of renal origin: Secondary | ICD-10-CM | POA: Diagnosis not present

## 2014-08-30 DIAGNOSIS — N186 End stage renal disease: Secondary | ICD-10-CM | POA: Diagnosis not present

## 2014-09-01 DIAGNOSIS — N2581 Secondary hyperparathyroidism of renal origin: Secondary | ICD-10-CM | POA: Diagnosis not present

## 2014-09-01 DIAGNOSIS — N186 End stage renal disease: Secondary | ICD-10-CM | POA: Diagnosis not present

## 2014-09-03 DIAGNOSIS — N2581 Secondary hyperparathyroidism of renal origin: Secondary | ICD-10-CM | POA: Diagnosis not present

## 2014-09-03 DIAGNOSIS — N186 End stage renal disease: Secondary | ICD-10-CM | POA: Diagnosis not present

## 2014-09-06 DIAGNOSIS — N2581 Secondary hyperparathyroidism of renal origin: Secondary | ICD-10-CM | POA: Diagnosis not present

## 2014-09-06 DIAGNOSIS — N186 End stage renal disease: Secondary | ICD-10-CM | POA: Diagnosis not present

## 2014-09-07 ENCOUNTER — Ambulatory Visit (INDEPENDENT_AMBULATORY_CARE_PROVIDER_SITE_OTHER): Payer: Medicare Other | Admitting: Family Medicine

## 2014-09-07 ENCOUNTER — Encounter: Payer: Self-pay | Admitting: Family Medicine

## 2014-09-07 VITALS — BP 118/58 | HR 85 | Temp 97.5°F | Ht 66.0 in | Wt 241.2 lb

## 2014-09-07 DIAGNOSIS — I10 Essential (primary) hypertension: Secondary | ICD-10-CM

## 2014-09-07 DIAGNOSIS — F329 Major depressive disorder, single episode, unspecified: Secondary | ICD-10-CM

## 2014-09-07 DIAGNOSIS — N186 End stage renal disease: Secondary | ICD-10-CM

## 2014-09-07 DIAGNOSIS — F32A Depression, unspecified: Secondary | ICD-10-CM

## 2014-09-07 MED ORDER — SERTRALINE HCL 50 MG PO TABS: ORAL_TABLET | ORAL | Status: DC

## 2014-09-07 NOTE — Assessment & Plan Note (Signed)
Pt is on transplant list --- and is waiting for a kidney  Pt is on dialysis

## 2014-09-07 NOTE — Progress Notes (Signed)
Subjective:    Patient ID: [name], female    DOB: Sep 23, 1945, 69 y.o.   MRN: [id number]  HPI  Patient here for f/u depression , bp   Past Medical History  Diagnosis Date  . Fractures, stress     in both feet--6 OR 7 YRS AGO--RESOLVED  . 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 Gout     doesn't require meds   . Anemia   . Blood transfusion     never had a reaction to blood transfusion  . Cardiomyopathy Mar 2013    Mild, EF 50-55% by Mar 2013 ECHO, diast dysfxn II  . Hypercalcemia     09/10/11  . HCAP (healthcare-associated pneumonia)     09/10/11  . Asthma     as a child  . Eczema   . Constipation     takes Miralax daily  . Oligouria   . Depression     takes Zoloft daily  . Peripheral vascular disease   . DVT of lower extremity, bilateral 12/21/11    "they're there now; been there for 2 wks"  . Sleep apnea     HAS CPAP-sleep study done 2007--PT NOT USING AT PRESENT  . ESRD (end stage renal disease) on dialysis 09/2011    Started dialysis Mar 2013 on M/W/F on [place]  . Arthritis     HIP  . Morbid obesity   . PAF (paroxysmal atrial fibrillation)      HX OF CEREBRAL BLEED WHILE ON COUMADIN-SO PT NOT ON ANY BLOOD THINNERS NOW  . Hypertension     takes Diltiazem daily   . Stroke 2009    denies residual, hemorrhagic now off coumadin  . Nonischemic dilated cardiomyopathy     Review of Systems  Constitutional: Negative for activity change, appetite change, fatigue and unexpected weight change.  Respiratory: Negative for cough and shortness of breath.   Cardiovascular: Negative for chest pain and palpitations.  Psychiatric/Behavioral: Negative for behavioral problems and dysphoric mood. The patient is not nervous/anxious.        Objective:    Physical Exam  Constitutional: She is oriented to person, place, and time. She appears well-developed and well-nourished. No distress.  HENT:  Right Ear: External ear normal.  Left Ear: External ear normal.    Nose: Nose normal.  Mouth/Throat: Oropharynx is clear and moist.  Eyes: EOM are normal. Pupils are equal, round, and reactive to light.  Neck: Normal range of motion. Neck supple.  Cardiovascular: Normal rate, regular rhythm and normal heart sounds.   No murmur heard. Pulmonary/Chest: Effort normal and breath sounds normal. No respiratory distress. She has no wheezes. She has no rales. She exhibits no tenderness.  Neurological: She is alert and oriented to person, place, and time.  Psychiatric: She has a normal mood and affect. Her behavior is normal. Judgment and thought content normal.    BP 118/58 mmHg  Pulse 85  Temp(Src) 97.5 F (36.4 C) (Oral)  Ht 5\' 6"  (1.676 m)  Wt 241 lb 3.2 oz (109.408 kg)  BMI 38.95 kg/m2  SpO2 100% Wt Readings from Last 3 Encounters:  09/07/14 241 lb 3.2 oz (109.408 kg)  08/12/14 240 lb (108.863 kg)  06/17/14 232 lb (105.235 kg)     Lab Results  Component Value Date   WBC 8.1 03/31/2012   HGB 8.3* 03/31/2012   HCT 26.4* 03/31/2012   PLT 235 03/31/2012   GLUCOSE 96 03/31/2012   CHOL  11/16/2008  184        ATP III CLASSIFICATION:  <200     mg/dL   Desirable  200-239  mg/dL   Borderline High  >=240    mg/dL   High          TRIG 157* 09/15/2011   HDL 33* 11/16/2008   LDLCALC * 11/16/2008    130        Total Cholesterol/HDL:CHD Risk Coronary Heart Disease Risk Table                     Men   Women  1/2 Average Risk   3.4   3.3  Average Risk       5.0   4.4  2 X Average Risk   9.6   7.1  3 X Average Risk  23.4   11.0        Use the calculated Patient Ratio above and the CHD Risk Table to determine the patient's CHD Risk.        ATP III CLASSIFICATION (LDL):  <100     mg/dL   Optimal  100-129  mg/dL   Near or Above                    Optimal  130-159  mg/dL   Borderline  160-189  mg/dL   High  >190     mg/dL   Very High   ALT 15 03/26/2012   AST 21 03/26/2012   NA 137 03/31/2012   K 3.9 03/31/2012   CL 102 03/31/2012    CREATININE 2.94* 03/31/2012   BUN 38* 03/31/2012   CO2 28 03/31/2012   TSH 2.655 09/11/2011   INR 0.98 03/25/2012   HGBA1C  11/16/2008    5.5 (NOTE) The ADA recommends the following therapeutic goal for glycemic control related to Hgb A1c measurement: Goal of therapy: <6.5 Hgb A1c  Reference: American Diabetes Association: Clinical Practice Recommendations 2010, Diabetes Care, 2010, 33: (Suppl  1).    US Breast Ltd Uni Right Inc [place]  08/03/2013   CLINICAL DATA:  Patient presents for a diagnostic right breast ultrasound as followup to a recent screening mammogram with tomography demonstrating a small right breast mass.  EXAM: ULTRASOUND OF THE right BREAST  COMPARISON:  Previous exams.  FINDINGS: Ultrasound is performed, showing a well-defined ovoid cyst at the 3 o'clock position of the right breast 2 cm from the nipple measuring 3 x 5 x 5 mm.  IMPRESSION: 3 x 5 x 5 mm cyst at the 3 o'clock position of the right breast 2 cm from the nipple.  RECOMMENDATION: Recommend continued annual bilateral screening mammographic followup.  I have discussed the findings and recommendations with the patient. Results were also provided in writing at the conclusion of the visit. If applicable, a reminder letter will be sent to the patient regarding the next appointment.  BI-RADS CATEGORY  2: Benign Finding(s)   Electronically Signed   By: Marin Olp M.D.   On: 08/03/2013 13:49       Assessment & Plan:   Problem List Items Addressed This Visit    Essential hypertension    Stable-- running low but on cardizem for a fib Pt will discuss with cardiology      End stage renal disease    Pt is on transplant list --- and is waiting for a kidney  Pt is on dialysis         Other Visit Diagnoses  Depression    -  Primary    Relevant Medications    sertraline (ZOLOFT) tablet        Garnet Koyanagi, DO

## 2014-09-07 NOTE — Patient Instructions (Signed)

## 2014-09-07 NOTE — Progress Notes (Signed)
Pre visit review using our clinic review tool, if applicable. No additional management support is needed unless otherwise documented below in the visit note. 

## 2014-09-07 NOTE — Assessment & Plan Note (Signed)
Stable-- running low but on cardizem for a fib Pt will discuss with cardiology

## 2014-09-08 DIAGNOSIS — N2581 Secondary hyperparathyroidism of renal origin: Secondary | ICD-10-CM | POA: Diagnosis not present

## 2014-09-08 DIAGNOSIS — N186 End stage renal disease: Secondary | ICD-10-CM | POA: Diagnosis not present

## 2014-09-10 DIAGNOSIS — N186 End stage renal disease: Secondary | ICD-10-CM | POA: Diagnosis not present

## 2014-09-10 DIAGNOSIS — N2581 Secondary hyperparathyroidism of renal origin: Secondary | ICD-10-CM | POA: Diagnosis not present

## 2014-09-13 DIAGNOSIS — N186 End stage renal disease: Secondary | ICD-10-CM | POA: Diagnosis not present

## 2014-09-13 DIAGNOSIS — N2581 Secondary hyperparathyroidism of renal origin: Secondary | ICD-10-CM | POA: Diagnosis not present

## 2014-09-13 DIAGNOSIS — Z992 Dependence on renal dialysis: Secondary | ICD-10-CM | POA: Diagnosis not present

## 2014-09-15 DIAGNOSIS — N2581 Secondary hyperparathyroidism of renal origin: Secondary | ICD-10-CM | POA: Diagnosis not present

## 2014-09-15 DIAGNOSIS — N186 End stage renal disease: Secondary | ICD-10-CM | POA: Diagnosis not present

## 2014-10-05 DIAGNOSIS — Z1231 Encounter for screening mammogram for malignant neoplasm of breast: Secondary | ICD-10-CM | POA: Diagnosis not present

## 2014-10-05 DIAGNOSIS — Z124 Encounter for screening for malignant neoplasm of cervix: Secondary | ICD-10-CM | POA: Diagnosis not present

## 2014-10-05 DIAGNOSIS — Z01419 Encounter for gynecological examination (general) (routine) without abnormal findings: Secondary | ICD-10-CM | POA: Diagnosis not present

## 2014-10-14 DIAGNOSIS — Z992 Dependence on renal dialysis: Secondary | ICD-10-CM | POA: Diagnosis not present

## 2014-10-14 DIAGNOSIS — N186 End stage renal disease: Secondary | ICD-10-CM | POA: Diagnosis not present

## 2014-10-15 DIAGNOSIS — N186 End stage renal disease: Secondary | ICD-10-CM | POA: Diagnosis not present

## 2014-10-15 DIAGNOSIS — N2581 Secondary hyperparathyroidism of renal origin: Secondary | ICD-10-CM | POA: Diagnosis not present

## 2014-11-13 DIAGNOSIS — Z992 Dependence on renal dialysis: Secondary | ICD-10-CM | POA: Diagnosis not present

## 2014-11-13 DIAGNOSIS — I10 Essential (primary) hypertension: Secondary | ICD-10-CM | POA: Diagnosis not present

## 2014-11-13 DIAGNOSIS — N186 End stage renal disease: Secondary | ICD-10-CM | POA: Diagnosis not present

## 2014-11-15 DIAGNOSIS — D509 Iron deficiency anemia, unspecified: Secondary | ICD-10-CM | POA: Diagnosis not present

## 2014-11-15 DIAGNOSIS — N186 End stage renal disease: Secondary | ICD-10-CM | POA: Diagnosis not present

## 2014-11-15 DIAGNOSIS — N2581 Secondary hyperparathyroidism of renal origin: Secondary | ICD-10-CM | POA: Diagnosis not present

## 2014-12-14 DIAGNOSIS — N186 End stage renal disease: Secondary | ICD-10-CM | POA: Diagnosis not present

## 2014-12-14 DIAGNOSIS — Z992 Dependence on renal dialysis: Secondary | ICD-10-CM | POA: Diagnosis not present

## 2014-12-14 DIAGNOSIS — I10 Essential (primary) hypertension: Secondary | ICD-10-CM | POA: Diagnosis not present

## 2014-12-15 DIAGNOSIS — D509 Iron deficiency anemia, unspecified: Secondary | ICD-10-CM | POA: Diagnosis not present

## 2014-12-15 DIAGNOSIS — N2581 Secondary hyperparathyroidism of renal origin: Secondary | ICD-10-CM | POA: Diagnosis not present

## 2014-12-15 DIAGNOSIS — N186 End stage renal disease: Secondary | ICD-10-CM | POA: Diagnosis not present

## 2014-12-22 ENCOUNTER — Encounter: Payer: Self-pay | Admitting: Behavioral Health

## 2015-01-13 DIAGNOSIS — I10 Essential (primary) hypertension: Secondary | ICD-10-CM | POA: Diagnosis not present

## 2015-01-13 DIAGNOSIS — N186 End stage renal disease: Secondary | ICD-10-CM | POA: Diagnosis not present

## 2015-01-13 DIAGNOSIS — Z992 Dependence on renal dialysis: Secondary | ICD-10-CM | POA: Diagnosis not present

## 2015-01-14 DIAGNOSIS — N186 End stage renal disease: Secondary | ICD-10-CM | POA: Diagnosis not present

## 2015-01-14 DIAGNOSIS — N2581 Secondary hyperparathyroidism of renal origin: Secondary | ICD-10-CM | POA: Diagnosis not present

## 2015-01-14 LAB — HM PAP SMEAR: HM Pap smear: NORMAL

## 2015-02-01 DIAGNOSIS — M25552 Pain in left hip: Secondary | ICD-10-CM | POA: Diagnosis not present

## 2015-02-01 DIAGNOSIS — M4186 Other forms of scoliosis, lumbar region: Secondary | ICD-10-CM | POA: Diagnosis not present

## 2015-02-04 DIAGNOSIS — N186 End stage renal disease: Secondary | ICD-10-CM | POA: Diagnosis not present

## 2015-02-04 DIAGNOSIS — N2581 Secondary hyperparathyroidism of renal origin: Secondary | ICD-10-CM | POA: Diagnosis not present

## 2015-02-13 DIAGNOSIS — Z992 Dependence on renal dialysis: Secondary | ICD-10-CM | POA: Diagnosis not present

## 2015-02-13 DIAGNOSIS — I10 Essential (primary) hypertension: Secondary | ICD-10-CM | POA: Diagnosis not present

## 2015-02-13 DIAGNOSIS — N186 End stage renal disease: Secondary | ICD-10-CM | POA: Diagnosis not present

## 2015-02-14 DIAGNOSIS — N186 End stage renal disease: Secondary | ICD-10-CM | POA: Diagnosis not present

## 2015-02-14 DIAGNOSIS — N2581 Secondary hyperparathyroidism of renal origin: Secondary | ICD-10-CM | POA: Diagnosis not present

## 2015-02-14 DIAGNOSIS — D509 Iron deficiency anemia, unspecified: Secondary | ICD-10-CM | POA: Diagnosis not present

## 2015-02-26 DIAGNOSIS — M5136 Other intervertebral disc degeneration, lumbar region: Secondary | ICD-10-CM | POA: Diagnosis not present

## 2015-02-26 DIAGNOSIS — M4156 Other secondary scoliosis, lumbar region: Secondary | ICD-10-CM | POA: Diagnosis not present

## 2015-03-08 DIAGNOSIS — M545 Low back pain: Secondary | ICD-10-CM | POA: Diagnosis not present

## 2015-03-09 DIAGNOSIS — M545 Low back pain: Secondary | ICD-10-CM | POA: Diagnosis not present

## 2015-03-10 DIAGNOSIS — M545 Low back pain: Secondary | ICD-10-CM | POA: Diagnosis not present

## 2015-03-15 DIAGNOSIS — M545 Low back pain: Secondary | ICD-10-CM | POA: Diagnosis not present

## 2015-03-16 DIAGNOSIS — I10 Essential (primary) hypertension: Secondary | ICD-10-CM | POA: Diagnosis not present

## 2015-03-16 DIAGNOSIS — N186 End stage renal disease: Secondary | ICD-10-CM | POA: Diagnosis not present

## 2015-03-16 DIAGNOSIS — Z992 Dependence on renal dialysis: Secondary | ICD-10-CM | POA: Diagnosis not present

## 2015-03-18 DIAGNOSIS — D631 Anemia in chronic kidney disease: Secondary | ICD-10-CM | POA: Diagnosis not present

## 2015-03-18 DIAGNOSIS — D509 Iron deficiency anemia, unspecified: Secondary | ICD-10-CM | POA: Diagnosis not present

## 2015-03-18 DIAGNOSIS — N2581 Secondary hyperparathyroidism of renal origin: Secondary | ICD-10-CM | POA: Diagnosis not present

## 2015-03-18 DIAGNOSIS — Z23 Encounter for immunization: Secondary | ICD-10-CM | POA: Diagnosis not present

## 2015-03-18 DIAGNOSIS — N186 End stage renal disease: Secondary | ICD-10-CM | POA: Diagnosis not present

## 2015-03-22 DIAGNOSIS — M545 Low back pain: Secondary | ICD-10-CM | POA: Diagnosis not present

## 2015-03-22 DIAGNOSIS — M25552 Pain in left hip: Secondary | ICD-10-CM | POA: Diagnosis not present

## 2015-03-22 DIAGNOSIS — M5136 Other intervertebral disc degeneration, lumbar region: Secondary | ICD-10-CM | POA: Diagnosis not present

## 2015-03-24 DIAGNOSIS — M545 Low back pain: Secondary | ICD-10-CM | POA: Diagnosis not present

## 2015-03-29 DIAGNOSIS — M545 Low back pain: Secondary | ICD-10-CM | POA: Diagnosis not present

## 2015-03-31 DIAGNOSIS — M545 Low back pain: Secondary | ICD-10-CM | POA: Diagnosis not present

## 2015-04-05 DIAGNOSIS — M545 Low back pain: Secondary | ICD-10-CM | POA: Diagnosis not present

## 2015-04-07 DIAGNOSIS — M545 Low back pain: Secondary | ICD-10-CM | POA: Diagnosis not present

## 2015-04-12 DIAGNOSIS — M545 Low back pain: Secondary | ICD-10-CM | POA: Diagnosis not present

## 2015-04-14 DIAGNOSIS — M545 Low back pain: Secondary | ICD-10-CM | POA: Diagnosis not present

## 2015-04-15 DIAGNOSIS — Z992 Dependence on renal dialysis: Secondary | ICD-10-CM | POA: Diagnosis not present

## 2015-04-15 DIAGNOSIS — N186 End stage renal disease: Secondary | ICD-10-CM | POA: Diagnosis not present

## 2015-04-15 DIAGNOSIS — I10 Essential (primary) hypertension: Secondary | ICD-10-CM | POA: Diagnosis not present

## 2015-04-18 DIAGNOSIS — N186 End stage renal disease: Secondary | ICD-10-CM | POA: Diagnosis not present

## 2015-04-18 DIAGNOSIS — D509 Iron deficiency anemia, unspecified: Secondary | ICD-10-CM | POA: Diagnosis not present

## 2015-04-18 DIAGNOSIS — D631 Anemia in chronic kidney disease: Secondary | ICD-10-CM | POA: Diagnosis not present

## 2015-04-19 DIAGNOSIS — M545 Low back pain: Secondary | ICD-10-CM | POA: Diagnosis not present

## 2015-04-26 DIAGNOSIS — M545 Low back pain: Secondary | ICD-10-CM | POA: Diagnosis not present

## 2015-04-28 DIAGNOSIS — M545 Low back pain: Secondary | ICD-10-CM | POA: Diagnosis not present

## 2015-05-03 DIAGNOSIS — M545 Low back pain: Secondary | ICD-10-CM | POA: Diagnosis not present

## 2015-05-05 ENCOUNTER — Ambulatory Visit: Payer: Medicare Other | Admitting: Physical Therapy

## 2015-05-05 ENCOUNTER — Encounter: Payer: Self-pay | Admitting: Physical Therapy

## 2015-05-11 DIAGNOSIS — M25552 Pain in left hip: Secondary | ICD-10-CM | POA: Diagnosis not present

## 2015-05-11 DIAGNOSIS — G8929 Other chronic pain: Secondary | ICD-10-CM | POA: Diagnosis not present

## 2015-05-12 DIAGNOSIS — I4430 Unspecified atrioventricular block: Secondary | ICD-10-CM | POA: Diagnosis not present

## 2015-05-12 DIAGNOSIS — I444 Left anterior fascicular block: Secondary | ICD-10-CM | POA: Diagnosis not present

## 2015-05-16 DIAGNOSIS — N186 End stage renal disease: Secondary | ICD-10-CM | POA: Diagnosis not present

## 2015-05-16 DIAGNOSIS — I10 Essential (primary) hypertension: Secondary | ICD-10-CM | POA: Diagnosis not present

## 2015-05-16 DIAGNOSIS — Z992 Dependence on renal dialysis: Secondary | ICD-10-CM | POA: Diagnosis not present

## 2015-05-18 DIAGNOSIS — N186 End stage renal disease: Secondary | ICD-10-CM | POA: Diagnosis not present

## 2015-05-18 DIAGNOSIS — N2581 Secondary hyperparathyroidism of renal origin: Secondary | ICD-10-CM | POA: Diagnosis not present

## 2015-05-18 DIAGNOSIS — D631 Anemia in chronic kidney disease: Secondary | ICD-10-CM | POA: Diagnosis not present

## 2015-05-18 DIAGNOSIS — D509 Iron deficiency anemia, unspecified: Secondary | ICD-10-CM | POA: Diagnosis not present

## 2015-05-27 ENCOUNTER — Other Ambulatory Visit: Payer: Self-pay

## 2015-05-27 DIAGNOSIS — I10 Essential (primary) hypertension: Secondary | ICD-10-CM

## 2015-05-27 MED ORDER — DILTIAZEM HCL ER COATED BEADS 240 MG PO CP24: 240.0000 mg | ORAL_CAPSULE | Freq: Every day | ORAL | Status: DC

## 2015-06-15 DIAGNOSIS — N186 End stage renal disease: Secondary | ICD-10-CM | POA: Diagnosis not present

## 2015-06-15 DIAGNOSIS — I10 Essential (primary) hypertension: Secondary | ICD-10-CM | POA: Diagnosis not present

## 2015-06-15 DIAGNOSIS — Z992 Dependence on renal dialysis: Secondary | ICD-10-CM | POA: Diagnosis not present

## 2015-06-17 DIAGNOSIS — N2581 Secondary hyperparathyroidism of renal origin: Secondary | ICD-10-CM | POA: Diagnosis not present

## 2015-06-17 DIAGNOSIS — D509 Iron deficiency anemia, unspecified: Secondary | ICD-10-CM | POA: Diagnosis not present

## 2015-06-17 DIAGNOSIS — D631 Anemia in chronic kidney disease: Secondary | ICD-10-CM | POA: Diagnosis not present

## 2015-06-17 DIAGNOSIS — N186 End stage renal disease: Secondary | ICD-10-CM | POA: Diagnosis not present

## 2015-06-20 DIAGNOSIS — D509 Iron deficiency anemia, unspecified: Secondary | ICD-10-CM | POA: Diagnosis not present

## 2015-06-20 DIAGNOSIS — D631 Anemia in chronic kidney disease: Secondary | ICD-10-CM | POA: Diagnosis not present

## 2015-06-20 DIAGNOSIS — N186 End stage renal disease: Secondary | ICD-10-CM | POA: Diagnosis not present

## 2015-06-20 DIAGNOSIS — N2581 Secondary hyperparathyroidism of renal origin: Secondary | ICD-10-CM | POA: Diagnosis not present

## 2015-06-22 DIAGNOSIS — D509 Iron deficiency anemia, unspecified: Secondary | ICD-10-CM | POA: Diagnosis not present

## 2015-06-22 DIAGNOSIS — D631 Anemia in chronic kidney disease: Secondary | ICD-10-CM | POA: Diagnosis not present

## 2015-06-22 DIAGNOSIS — N2581 Secondary hyperparathyroidism of renal origin: Secondary | ICD-10-CM | POA: Diagnosis not present

## 2015-06-22 DIAGNOSIS — N186 End stage renal disease: Secondary | ICD-10-CM | POA: Diagnosis not present

## 2015-06-24 DIAGNOSIS — N186 End stage renal disease: Secondary | ICD-10-CM | POA: Diagnosis not present

## 2015-06-24 DIAGNOSIS — N2581 Secondary hyperparathyroidism of renal origin: Secondary | ICD-10-CM | POA: Diagnosis not present

## 2015-06-24 DIAGNOSIS — D509 Iron deficiency anemia, unspecified: Secondary | ICD-10-CM | POA: Diagnosis not present

## 2015-06-24 DIAGNOSIS — D631 Anemia in chronic kidney disease: Secondary | ICD-10-CM | POA: Diagnosis not present

## 2015-06-27 DIAGNOSIS — D631 Anemia in chronic kidney disease: Secondary | ICD-10-CM | POA: Diagnosis not present

## 2015-06-27 DIAGNOSIS — D509 Iron deficiency anemia, unspecified: Secondary | ICD-10-CM | POA: Diagnosis not present

## 2015-06-27 DIAGNOSIS — N2581 Secondary hyperparathyroidism of renal origin: Secondary | ICD-10-CM | POA: Diagnosis not present

## 2015-06-27 DIAGNOSIS — N186 End stage renal disease: Secondary | ICD-10-CM | POA: Diagnosis not present

## 2015-06-29 DIAGNOSIS — D631 Anemia in chronic kidney disease: Secondary | ICD-10-CM | POA: Diagnosis not present

## 2015-06-29 DIAGNOSIS — N186 End stage renal disease: Secondary | ICD-10-CM | POA: Diagnosis not present

## 2015-06-29 DIAGNOSIS — N2581 Secondary hyperparathyroidism of renal origin: Secondary | ICD-10-CM | POA: Diagnosis not present

## 2015-06-29 DIAGNOSIS — D509 Iron deficiency anemia, unspecified: Secondary | ICD-10-CM | POA: Diagnosis not present

## 2015-06-30 ENCOUNTER — Telehealth: Payer: Self-pay | Admitting: Family Medicine

## 2015-06-30 NOTE — Telephone Encounter (Signed)
No fever or severe abdominal pain, and she has agreed to try the imodium, if no relief she will call for an apt.     KP

## 2015-06-30 NOTE — Telephone Encounter (Signed)
Pt was at dialysis yesterday and person beside her was sick and vomiting. Pt states that last night she began vomiting and diarrhea. She has stopped vomiting today but still has diarrhea. She is asking what she can take OTC to help since it's probably a virus. Please call at [phone].

## 2015-06-30 NOTE — Telephone Encounter (Signed)
immodium--- but if any fever or abd pain --- ov

## 2015-06-30 NOTE — Telephone Encounter (Signed)
Please advise      KP 

## 2015-07-01 DIAGNOSIS — D631 Anemia in chronic kidney disease: Secondary | ICD-10-CM | POA: Diagnosis not present

## 2015-07-01 DIAGNOSIS — N186 End stage renal disease: Secondary | ICD-10-CM | POA: Diagnosis not present

## 2015-07-01 DIAGNOSIS — D509 Iron deficiency anemia, unspecified: Secondary | ICD-10-CM | POA: Diagnosis not present

## 2015-07-01 DIAGNOSIS — N2581 Secondary hyperparathyroidism of renal origin: Secondary | ICD-10-CM | POA: Diagnosis not present

## 2015-07-04 DIAGNOSIS — N186 End stage renal disease: Secondary | ICD-10-CM | POA: Diagnosis not present

## 2015-07-04 DIAGNOSIS — D631 Anemia in chronic kidney disease: Secondary | ICD-10-CM | POA: Diagnosis not present

## 2015-07-04 DIAGNOSIS — D509 Iron deficiency anemia, unspecified: Secondary | ICD-10-CM | POA: Diagnosis not present

## 2015-07-04 DIAGNOSIS — N2581 Secondary hyperparathyroidism of renal origin: Secondary | ICD-10-CM | POA: Diagnosis not present

## 2015-07-06 DIAGNOSIS — D509 Iron deficiency anemia, unspecified: Secondary | ICD-10-CM | POA: Diagnosis not present

## 2015-07-06 DIAGNOSIS — D631 Anemia in chronic kidney disease: Secondary | ICD-10-CM | POA: Diagnosis not present

## 2015-07-06 DIAGNOSIS — N2581 Secondary hyperparathyroidism of renal origin: Secondary | ICD-10-CM | POA: Diagnosis not present

## 2015-07-06 DIAGNOSIS — N186 End stage renal disease: Secondary | ICD-10-CM | POA: Diagnosis not present

## 2015-07-08 DIAGNOSIS — D631 Anemia in chronic kidney disease: Secondary | ICD-10-CM | POA: Diagnosis not present

## 2015-07-08 DIAGNOSIS — D509 Iron deficiency anemia, unspecified: Secondary | ICD-10-CM | POA: Diagnosis not present

## 2015-07-08 DIAGNOSIS — N2581 Secondary hyperparathyroidism of renal origin: Secondary | ICD-10-CM | POA: Diagnosis not present

## 2015-07-08 DIAGNOSIS — N186 End stage renal disease: Secondary | ICD-10-CM | POA: Diagnosis not present

## 2015-07-11 DIAGNOSIS — N2581 Secondary hyperparathyroidism of renal origin: Secondary | ICD-10-CM | POA: Diagnosis not present

## 2015-07-11 DIAGNOSIS — D509 Iron deficiency anemia, unspecified: Secondary | ICD-10-CM | POA: Diagnosis not present

## 2015-07-11 DIAGNOSIS — N186 End stage renal disease: Secondary | ICD-10-CM | POA: Diagnosis not present

## 2015-07-11 DIAGNOSIS — D631 Anemia in chronic kidney disease: Secondary | ICD-10-CM | POA: Diagnosis not present

## 2015-07-13 DIAGNOSIS — D509 Iron deficiency anemia, unspecified: Secondary | ICD-10-CM | POA: Diagnosis not present

## 2015-07-13 DIAGNOSIS — N2581 Secondary hyperparathyroidism of renal origin: Secondary | ICD-10-CM | POA: Diagnosis not present

## 2015-07-13 DIAGNOSIS — D631 Anemia in chronic kidney disease: Secondary | ICD-10-CM | POA: Diagnosis not present

## 2015-07-13 DIAGNOSIS — N186 End stage renal disease: Secondary | ICD-10-CM | POA: Diagnosis not present

## 2015-07-15 DIAGNOSIS — N186 End stage renal disease: Secondary | ICD-10-CM | POA: Diagnosis not present

## 2015-07-15 DIAGNOSIS — D631 Anemia in chronic kidney disease: Secondary | ICD-10-CM | POA: Diagnosis not present

## 2015-07-15 DIAGNOSIS — D509 Iron deficiency anemia, unspecified: Secondary | ICD-10-CM | POA: Diagnosis not present

## 2015-07-15 DIAGNOSIS — N2581 Secondary hyperparathyroidism of renal origin: Secondary | ICD-10-CM | POA: Diagnosis not present

## 2015-07-16 DIAGNOSIS — N186 End stage renal disease: Secondary | ICD-10-CM | POA: Diagnosis not present

## 2015-07-16 DIAGNOSIS — Z992 Dependence on renal dialysis: Secondary | ICD-10-CM | POA: Diagnosis not present

## 2015-07-16 DIAGNOSIS — I10 Essential (primary) hypertension: Secondary | ICD-10-CM | POA: Diagnosis not present

## 2015-07-18 DIAGNOSIS — Z6839 Body mass index (BMI) 39.0-39.9, adult: Secondary | ICD-10-CM | POA: Diagnosis not present

## 2015-07-18 DIAGNOSIS — I444 Left anterior fascicular block: Secondary | ICD-10-CM | POA: Diagnosis not present

## 2015-07-18 DIAGNOSIS — M109 Gout, unspecified: Secondary | ICD-10-CM | POA: Diagnosis present

## 2015-07-18 DIAGNOSIS — E8770 Fluid overload, unspecified: Secondary | ICD-10-CM | POA: Diagnosis not present

## 2015-07-18 DIAGNOSIS — N186 End stage renal disease: Secondary | ICD-10-CM | POA: Diagnosis not present

## 2015-07-18 DIAGNOSIS — Z992 Dependence on renal dialysis: Secondary | ICD-10-CM | POA: Diagnosis not present

## 2015-07-18 DIAGNOSIS — M2170 Unequal limb length (acquired), unspecified site: Secondary | ICD-10-CM | POA: Diagnosis present

## 2015-07-18 DIAGNOSIS — F329 Major depressive disorder, single episode, unspecified: Secondary | ICD-10-CM | POA: Diagnosis present

## 2015-07-18 DIAGNOSIS — T8619 Other complication of kidney transplant: Secondary | ICD-10-CM | POA: Diagnosis not present

## 2015-07-18 DIAGNOSIS — J45909 Unspecified asthma, uncomplicated: Secondary | ICD-10-CM | POA: Diagnosis present

## 2015-07-18 DIAGNOSIS — Z452 Encounter for adjustment and management of vascular access device: Secondary | ICD-10-CM | POA: Diagnosis not present

## 2015-07-18 DIAGNOSIS — I120 Hypertensive chronic kidney disease with stage 5 chronic kidney disease or end stage renal disease: Secondary | ICD-10-CM | POA: Diagnosis not present

## 2015-07-18 DIAGNOSIS — I429 Cardiomyopathy, unspecified: Secondary | ICD-10-CM | POA: Diagnosis present

## 2015-07-18 DIAGNOSIS — D631 Anemia in chronic kidney disease: Secondary | ICD-10-CM | POA: Diagnosis not present

## 2015-07-18 DIAGNOSIS — I4891 Unspecified atrial fibrillation: Secondary | ICD-10-CM | POA: Diagnosis not present

## 2015-07-18 DIAGNOSIS — Z4822 Encounter for aftercare following kidney transplant: Secondary | ICD-10-CM | POA: Diagnosis not present

## 2015-07-18 DIAGNOSIS — Z9884 Bariatric surgery status: Secondary | ICD-10-CM | POA: Diagnosis not present

## 2015-07-18 DIAGNOSIS — Z940 Kidney transplant status: Secondary | ICD-10-CM | POA: Diagnosis not present

## 2015-07-18 DIAGNOSIS — H9190 Unspecified hearing loss, unspecified ear: Secondary | ICD-10-CM | POA: Diagnosis present

## 2015-07-18 DIAGNOSIS — G4733 Obstructive sleep apnea (adult) (pediatric): Secondary | ICD-10-CM | POA: Diagnosis not present

## 2015-07-18 DIAGNOSIS — N2581 Secondary hyperparathyroidism of renal origin: Secondary | ICD-10-CM | POA: Diagnosis not present

## 2015-07-18 DIAGNOSIS — Z5181 Encounter for therapeutic drug level monitoring: Secondary | ICD-10-CM | POA: Diagnosis not present

## 2015-07-18 DIAGNOSIS — E669 Obesity, unspecified: Secondary | ICD-10-CM | POA: Diagnosis present

## 2015-07-18 DIAGNOSIS — Z8673 Personal history of transient ischemic attack (TIA), and cerebral infarction without residual deficits: Secondary | ICD-10-CM | POA: Diagnosis not present

## 2015-07-18 DIAGNOSIS — K5900 Constipation, unspecified: Secondary | ICD-10-CM | POA: Diagnosis present

## 2015-07-18 DIAGNOSIS — Z86718 Personal history of other venous thrombosis and embolism: Secondary | ICD-10-CM | POA: Diagnosis not present

## 2015-07-18 DIAGNOSIS — Z7901 Long term (current) use of anticoagulants: Secondary | ICD-10-CM | POA: Diagnosis not present

## 2015-07-18 DIAGNOSIS — R9431 Abnormal electrocardiogram [ECG] [EKG]: Secondary | ICD-10-CM | POA: Diagnosis not present

## 2015-07-18 DIAGNOSIS — N3289 Other specified disorders of bladder: Secondary | ICD-10-CM | POA: Diagnosis not present

## 2015-07-18 DIAGNOSIS — I493 Ventricular premature depolarization: Secondary | ICD-10-CM | POA: Diagnosis not present

## 2015-07-18 DIAGNOSIS — E213 Hyperparathyroidism, unspecified: Secondary | ICD-10-CM | POA: Diagnosis present

## 2015-07-18 DIAGNOSIS — Z79899 Other long term (current) drug therapy: Secondary | ICD-10-CM | POA: Diagnosis not present

## 2015-07-18 DIAGNOSIS — I10 Essential (primary) hypertension: Secondary | ICD-10-CM | POA: Diagnosis not present

## 2015-07-18 DIAGNOSIS — M1990 Unspecified osteoarthritis, unspecified site: Secondary | ICD-10-CM | POA: Diagnosis present

## 2015-07-19 HISTORY — PX: KIDNEY TRANSPLANT: SHX239

## 2015-07-25 DIAGNOSIS — D849 Immunodeficiency, unspecified: Secondary | ICD-10-CM | POA: Insufficient documentation

## 2015-07-25 DIAGNOSIS — Z940 Kidney transplant status: Secondary | ICD-10-CM

## 2015-07-25 DIAGNOSIS — D899 Disorder involving the immune mechanism, unspecified: Secondary | ICD-10-CM

## 2015-07-25 HISTORY — DX: Immunodeficiency, unspecified: D84.9

## 2015-07-25 HISTORY — DX: Kidney transplant status: Z94.0

## 2015-07-27 DIAGNOSIS — T8619 Other complication of kidney transplant: Secondary | ICD-10-CM | POA: Diagnosis not present

## 2015-07-27 DIAGNOSIS — Z86718 Personal history of other venous thrombosis and embolism: Secondary | ICD-10-CM | POA: Diagnosis not present

## 2015-07-27 DIAGNOSIS — I10 Essential (primary) hypertension: Secondary | ICD-10-CM | POA: Diagnosis not present

## 2015-07-27 DIAGNOSIS — Z940 Kidney transplant status: Secondary | ICD-10-CM | POA: Diagnosis not present

## 2015-07-27 DIAGNOSIS — R000 Tachycardia, unspecified: Secondary | ICD-10-CM | POA: Diagnosis not present

## 2015-07-27 DIAGNOSIS — Z5181 Encounter for therapeutic drug level monitoring: Secondary | ICD-10-CM | POA: Diagnosis not present

## 2015-07-27 DIAGNOSIS — Z79899 Other long term (current) drug therapy: Secondary | ICD-10-CM | POA: Diagnosis not present

## 2015-07-27 DIAGNOSIS — I4891 Unspecified atrial fibrillation: Secondary | ICD-10-CM | POA: Diagnosis not present

## 2015-07-27 DIAGNOSIS — Z9884 Bariatric surgery status: Secondary | ICD-10-CM | POA: Diagnosis not present

## 2015-07-27 DIAGNOSIS — Z7982 Long term (current) use of aspirin: Secondary | ICD-10-CM | POA: Diagnosis not present

## 2015-07-27 DIAGNOSIS — Z4822 Encounter for aftercare following kidney transplant: Secondary | ICD-10-CM | POA: Diagnosis not present

## 2015-07-27 DIAGNOSIS — Z8673 Personal history of transient ischemic attack (TIA), and cerebral infarction without residual deficits: Secondary | ICD-10-CM | POA: Diagnosis not present

## 2015-07-27 DIAGNOSIS — D899 Disorder involving the immune mechanism, unspecified: Secondary | ICD-10-CM | POA: Diagnosis not present

## 2015-07-27 DIAGNOSIS — G4733 Obstructive sleep apnea (adult) (pediatric): Secondary | ICD-10-CM | POA: Diagnosis not present

## 2015-07-28 DIAGNOSIS — I444 Left anterior fascicular block: Secondary | ICD-10-CM | POA: Diagnosis not present

## 2015-07-28 DIAGNOSIS — Z4822 Encounter for aftercare following kidney transplant: Secondary | ICD-10-CM | POA: Diagnosis not present

## 2015-07-28 DIAGNOSIS — I10 Essential (primary) hypertension: Secondary | ICD-10-CM | POA: Diagnosis not present

## 2015-07-28 DIAGNOSIS — I4589 Other specified conduction disorders: Secondary | ICD-10-CM | POA: Diagnosis not present

## 2015-07-28 DIAGNOSIS — Z5181 Encounter for therapeutic drug level monitoring: Secondary | ICD-10-CM | POA: Diagnosis not present

## 2015-07-28 DIAGNOSIS — T8619 Other complication of kidney transplant: Secondary | ICD-10-CM | POA: Diagnosis not present

## 2015-07-28 DIAGNOSIS — Z86718 Personal history of other venous thrombosis and embolism: Secondary | ICD-10-CM | POA: Diagnosis not present

## 2015-07-28 DIAGNOSIS — Z79899 Other long term (current) drug therapy: Secondary | ICD-10-CM | POA: Diagnosis not present

## 2015-07-28 DIAGNOSIS — R000 Tachycardia, unspecified: Secondary | ICD-10-CM | POA: Diagnosis not present

## 2015-07-28 DIAGNOSIS — Z940 Kidney transplant status: Secondary | ICD-10-CM | POA: Diagnosis not present

## 2015-07-28 DIAGNOSIS — I4892 Unspecified atrial flutter: Secondary | ICD-10-CM | POA: Diagnosis not present

## 2015-07-28 DIAGNOSIS — G4733 Obstructive sleep apnea (adult) (pediatric): Secondary | ICD-10-CM | POA: Diagnosis not present

## 2015-07-28 DIAGNOSIS — I4891 Unspecified atrial fibrillation: Secondary | ICD-10-CM | POA: Diagnosis not present

## 2015-08-02 DIAGNOSIS — I483 Typical atrial flutter: Secondary | ICD-10-CM | POA: Diagnosis not present

## 2015-08-02 DIAGNOSIS — M1990 Unspecified osteoarthritis, unspecified site: Secondary | ICD-10-CM | POA: Diagnosis present

## 2015-08-02 DIAGNOSIS — I482 Chronic atrial fibrillation: Secondary | ICD-10-CM | POA: Diagnosis not present

## 2015-08-02 DIAGNOSIS — I480 Paroxysmal atrial fibrillation: Secondary | ICD-10-CM | POA: Diagnosis not present

## 2015-08-02 DIAGNOSIS — E669 Obesity, unspecified: Secondary | ICD-10-CM | POA: Diagnosis present

## 2015-08-02 DIAGNOSIS — I120 Hypertensive chronic kidney disease with stage 5 chronic kidney disease or end stage renal disease: Secondary | ICD-10-CM | POA: Diagnosis not present

## 2015-08-02 DIAGNOSIS — I517 Cardiomegaly: Secondary | ICD-10-CM | POA: Diagnosis not present

## 2015-08-02 DIAGNOSIS — R9431 Abnormal electrocardiogram [ECG] [EKG]: Secondary | ICD-10-CM | POA: Diagnosis not present

## 2015-08-02 DIAGNOSIS — Z79899 Other long term (current) drug therapy: Secondary | ICD-10-CM | POA: Diagnosis not present

## 2015-08-02 DIAGNOSIS — L309 Dermatitis, unspecified: Secondary | ICD-10-CM | POA: Diagnosis present

## 2015-08-02 DIAGNOSIS — Z940 Kidney transplant status: Secondary | ICD-10-CM | POA: Diagnosis not present

## 2015-08-02 DIAGNOSIS — Z8679 Personal history of other diseases of the circulatory system: Secondary | ICD-10-CM | POA: Diagnosis not present

## 2015-08-02 DIAGNOSIS — I082 Rheumatic disorders of both aortic and tricuspid valves: Secondary | ICD-10-CM | POA: Diagnosis not present

## 2015-08-02 DIAGNOSIS — Z96641 Presence of right artificial hip joint: Secondary | ICD-10-CM | POA: Diagnosis present

## 2015-08-02 DIAGNOSIS — K5900 Constipation, unspecified: Secondary | ICD-10-CM | POA: Diagnosis present

## 2015-08-02 DIAGNOSIS — Z4822 Encounter for aftercare following kidney transplant: Secondary | ICD-10-CM | POA: Diagnosis not present

## 2015-08-02 DIAGNOSIS — H9190 Unspecified hearing loss, unspecified ear: Secondary | ICD-10-CM | POA: Diagnosis present

## 2015-08-02 DIAGNOSIS — F329 Major depressive disorder, single episode, unspecified: Secondary | ICD-10-CM | POA: Diagnosis present

## 2015-08-02 DIAGNOSIS — Z6838 Body mass index (BMI) 38.0-38.9, adult: Secondary | ICD-10-CM | POA: Diagnosis not present

## 2015-08-02 DIAGNOSIS — I499 Cardiac arrhythmia, unspecified: Secondary | ICD-10-CM | POA: Diagnosis not present

## 2015-08-02 DIAGNOSIS — Z86718 Personal history of other venous thrombosis and embolism: Secondary | ICD-10-CM | POA: Diagnosis not present

## 2015-08-02 DIAGNOSIS — Z7952 Long term (current) use of systemic steroids: Secondary | ICD-10-CM | POA: Diagnosis not present

## 2015-08-02 DIAGNOSIS — I110 Hypertensive heart disease with heart failure: Secondary | ICD-10-CM | POA: Diagnosis not present

## 2015-08-02 DIAGNOSIS — D8989 Other specified disorders involving the immune mechanism, not elsewhere classified: Secondary | ICD-10-CM | POA: Diagnosis not present

## 2015-08-02 DIAGNOSIS — I5032 Chronic diastolic (congestive) heart failure: Secondary | ICD-10-CM | POA: Diagnosis present

## 2015-08-02 DIAGNOSIS — G4733 Obstructive sleep apnea (adult) (pediatric): Secondary | ICD-10-CM | POA: Diagnosis present

## 2015-08-02 DIAGNOSIS — Z5181 Encounter for therapeutic drug level monitoring: Secondary | ICD-10-CM | POA: Diagnosis not present

## 2015-08-02 DIAGNOSIS — I4430 Unspecified atrioventricular block: Secondary | ICD-10-CM | POA: Diagnosis not present

## 2015-08-02 DIAGNOSIS — J45909 Unspecified asthma, uncomplicated: Secondary | ICD-10-CM | POA: Diagnosis present

## 2015-08-02 DIAGNOSIS — N186 End stage renal disease: Secondary | ICD-10-CM | POA: Diagnosis not present

## 2015-08-02 DIAGNOSIS — Z8673 Personal history of transient ischemic attack (TIA), and cerebral infarction without residual deficits: Secondary | ICD-10-CM | POA: Diagnosis not present

## 2015-08-02 DIAGNOSIS — D6489 Other specified anemias: Secondary | ICD-10-CM | POA: Diagnosis present

## 2015-08-02 DIAGNOSIS — Z7982 Long term (current) use of aspirin: Secondary | ICD-10-CM | POA: Diagnosis not present

## 2015-08-02 DIAGNOSIS — N3090 Cystitis, unspecified without hematuria: Secondary | ICD-10-CM | POA: Diagnosis present

## 2015-08-02 DIAGNOSIS — I4892 Unspecified atrial flutter: Secondary | ICD-10-CM | POA: Diagnosis not present

## 2015-08-02 DIAGNOSIS — R000 Tachycardia, unspecified: Secondary | ICD-10-CM | POA: Diagnosis not present

## 2015-08-02 DIAGNOSIS — M109 Gout, unspecified: Secondary | ICD-10-CM | POA: Diagnosis present

## 2015-08-02 DIAGNOSIS — I428 Other cardiomyopathies: Secondary | ICD-10-CM | POA: Diagnosis present

## 2015-08-02 HISTORY — DX: Hypomagnesemia: E83.42

## 2015-08-08 DIAGNOSIS — Z7901 Long term (current) use of anticoagulants: Secondary | ICD-10-CM | POA: Diagnosis not present

## 2015-08-08 DIAGNOSIS — Z899 Acquired absence of limb, unspecified: Secondary | ICD-10-CM | POA: Diagnosis not present

## 2015-08-08 DIAGNOSIS — I4892 Unspecified atrial flutter: Secondary | ICD-10-CM | POA: Diagnosis not present

## 2015-08-08 DIAGNOSIS — N186 End stage renal disease: Secondary | ICD-10-CM | POA: Diagnosis not present

## 2015-08-08 DIAGNOSIS — Z7982 Long term (current) use of aspirin: Secondary | ICD-10-CM | POA: Diagnosis not present

## 2015-08-08 DIAGNOSIS — Z9889 Other specified postprocedural states: Secondary | ICD-10-CM | POA: Diagnosis not present

## 2015-08-08 DIAGNOSIS — I619 Nontraumatic intracerebral hemorrhage, unspecified: Secondary | ICD-10-CM | POA: Diagnosis not present

## 2015-08-08 DIAGNOSIS — Z86718 Personal history of other venous thrombosis and embolism: Secondary | ICD-10-CM | POA: Diagnosis not present

## 2015-08-08 DIAGNOSIS — I120 Hypertensive chronic kidney disease with stage 5 chronic kidney disease or end stage renal disease: Secondary | ICD-10-CM | POA: Diagnosis not present

## 2015-08-08 DIAGNOSIS — I4891 Unspecified atrial fibrillation: Secondary | ICD-10-CM | POA: Diagnosis not present

## 2015-08-08 DIAGNOSIS — Z79899 Other long term (current) drug therapy: Secondary | ICD-10-CM | POA: Diagnosis not present

## 2015-08-08 DIAGNOSIS — T45515D Adverse effect of anticoagulants, subsequent encounter: Secondary | ICD-10-CM | POA: Diagnosis not present

## 2015-08-08 DIAGNOSIS — D8989 Other specified disorders involving the immune mechanism, not elsewhere classified: Secondary | ICD-10-CM | POA: Diagnosis not present

## 2015-08-08 DIAGNOSIS — Z940 Kidney transplant status: Secondary | ICD-10-CM | POA: Diagnosis not present

## 2015-08-08 DIAGNOSIS — Z4822 Encounter for aftercare following kidney transplant: Secondary | ICD-10-CM | POA: Diagnosis not present

## 2015-08-11 DIAGNOSIS — Z940 Kidney transplant status: Secondary | ICD-10-CM | POA: Diagnosis not present

## 2015-08-11 DIAGNOSIS — D8989 Other specified disorders involving the immune mechanism, not elsewhere classified: Secondary | ICD-10-CM | POA: Diagnosis not present

## 2015-08-11 DIAGNOSIS — I4892 Unspecified atrial flutter: Secondary | ICD-10-CM | POA: Diagnosis not present

## 2015-08-11 DIAGNOSIS — Z86718 Personal history of other venous thrombosis and embolism: Secondary | ICD-10-CM | POA: Diagnosis not present

## 2015-08-11 DIAGNOSIS — Z79899 Other long term (current) drug therapy: Secondary | ICD-10-CM | POA: Diagnosis not present

## 2015-08-11 DIAGNOSIS — D899 Disorder involving the immune mechanism, unspecified: Secondary | ICD-10-CM | POA: Diagnosis not present

## 2015-08-11 DIAGNOSIS — Z4802 Encounter for removal of sutures: Secondary | ICD-10-CM | POA: Diagnosis not present

## 2015-08-11 DIAGNOSIS — Z4822 Encounter for aftercare following kidney transplant: Secondary | ICD-10-CM | POA: Diagnosis not present

## 2015-08-15 DIAGNOSIS — Z7901 Long term (current) use of anticoagulants: Secondary | ICD-10-CM | POA: Diagnosis not present

## 2015-08-15 DIAGNOSIS — Z466 Encounter for fitting and adjustment of urinary device: Secondary | ICD-10-CM | POA: Diagnosis not present

## 2015-08-15 DIAGNOSIS — Z4802 Encounter for removal of sutures: Secondary | ICD-10-CM | POA: Diagnosis not present

## 2015-08-15 DIAGNOSIS — I120 Hypertensive chronic kidney disease with stage 5 chronic kidney disease or end stage renal disease: Secondary | ICD-10-CM | POA: Diagnosis not present

## 2015-08-15 DIAGNOSIS — D899 Disorder involving the immune mechanism, unspecified: Secondary | ICD-10-CM | POA: Diagnosis not present

## 2015-08-15 DIAGNOSIS — Z4822 Encounter for aftercare following kidney transplant: Secondary | ICD-10-CM | POA: Diagnosis not present

## 2015-08-15 DIAGNOSIS — Z940 Kidney transplant status: Secondary | ICD-10-CM | POA: Diagnosis not present

## 2015-08-15 DIAGNOSIS — Z86718 Personal history of other venous thrombosis and embolism: Secondary | ICD-10-CM | POA: Diagnosis not present

## 2015-08-15 DIAGNOSIS — N186 End stage renal disease: Secondary | ICD-10-CM | POA: Diagnosis not present

## 2015-08-15 DIAGNOSIS — Z79899 Other long term (current) drug therapy: Secondary | ICD-10-CM | POA: Diagnosis not present

## 2015-08-15 DIAGNOSIS — Z9889 Other specified postprocedural states: Secondary | ICD-10-CM | POA: Diagnosis not present

## 2015-08-15 DIAGNOSIS — I4891 Unspecified atrial fibrillation: Secondary | ICD-10-CM | POA: Diagnosis not present

## 2015-08-15 DIAGNOSIS — Z888 Allergy status to other drugs, medicaments and biological substances status: Secondary | ICD-10-CM | POA: Diagnosis not present

## 2015-08-15 DIAGNOSIS — Z7982 Long term (current) use of aspirin: Secondary | ICD-10-CM | POA: Diagnosis not present

## 2015-08-15 DIAGNOSIS — I129 Hypertensive chronic kidney disease with stage 1 through stage 4 chronic kidney disease, or unspecified chronic kidney disease: Secondary | ICD-10-CM | POA: Diagnosis not present

## 2015-08-22 DIAGNOSIS — Z7901 Long term (current) use of anticoagulants: Secondary | ICD-10-CM | POA: Diagnosis not present

## 2015-08-22 DIAGNOSIS — N3000 Acute cystitis without hematuria: Secondary | ICD-10-CM | POA: Diagnosis not present

## 2015-08-22 DIAGNOSIS — Z9884 Bariatric surgery status: Secondary | ICD-10-CM | POA: Diagnosis not present

## 2015-08-22 DIAGNOSIS — I4891 Unspecified atrial fibrillation: Secondary | ICD-10-CM | POA: Diagnosis not present

## 2015-08-22 DIAGNOSIS — Z7952 Long term (current) use of systemic steroids: Secondary | ICD-10-CM | POA: Diagnosis not present

## 2015-08-22 DIAGNOSIS — I619 Nontraumatic intracerebral hemorrhage, unspecified: Secondary | ICD-10-CM | POA: Diagnosis not present

## 2015-08-22 DIAGNOSIS — Z79899 Other long term (current) drug therapy: Secondary | ICD-10-CM | POA: Diagnosis not present

## 2015-08-22 DIAGNOSIS — Z86718 Personal history of other venous thrombosis and embolism: Secondary | ICD-10-CM | POA: Diagnosis not present

## 2015-08-22 DIAGNOSIS — I120 Hypertensive chronic kidney disease with stage 5 chronic kidney disease or end stage renal disease: Secondary | ICD-10-CM | POA: Diagnosis not present

## 2015-08-22 DIAGNOSIS — Z7982 Long term (current) use of aspirin: Secondary | ICD-10-CM | POA: Diagnosis not present

## 2015-08-22 DIAGNOSIS — N390 Urinary tract infection, site not specified: Secondary | ICD-10-CM | POA: Diagnosis not present

## 2015-08-22 DIAGNOSIS — I4892 Unspecified atrial flutter: Secondary | ICD-10-CM | POA: Diagnosis not present

## 2015-08-22 DIAGNOSIS — Z888 Allergy status to other drugs, medicaments and biological substances status: Secondary | ICD-10-CM | POA: Diagnosis not present

## 2015-08-22 DIAGNOSIS — N186 End stage renal disease: Secondary | ICD-10-CM | POA: Diagnosis not present

## 2015-08-22 DIAGNOSIS — Z940 Kidney transplant status: Secondary | ICD-10-CM | POA: Diagnosis not present

## 2015-08-22 DIAGNOSIS — D899 Disorder involving the immune mechanism, unspecified: Secondary | ICD-10-CM | POA: Diagnosis not present

## 2015-08-22 DIAGNOSIS — E1122 Type 2 diabetes mellitus with diabetic chronic kidney disease: Secondary | ICD-10-CM | POA: Diagnosis not present

## 2015-08-22 DIAGNOSIS — Z4822 Encounter for aftercare following kidney transplant: Secondary | ICD-10-CM | POA: Diagnosis not present

## 2015-08-29 DIAGNOSIS — I493 Ventricular premature depolarization: Secondary | ICD-10-CM | POA: Diagnosis not present

## 2015-08-29 DIAGNOSIS — Z940 Kidney transplant status: Secondary | ICD-10-CM | POA: Diagnosis not present

## 2015-08-29 DIAGNOSIS — Z09 Encounter for follow-up examination after completed treatment for conditions other than malignant neoplasm: Secondary | ICD-10-CM | POA: Diagnosis not present

## 2015-08-29 DIAGNOSIS — I4510 Unspecified right bundle-branch block: Secondary | ICD-10-CM | POA: Diagnosis not present

## 2015-08-29 DIAGNOSIS — E669 Obesity, unspecified: Secondary | ICD-10-CM | POA: Diagnosis not present

## 2015-08-29 DIAGNOSIS — I4891 Unspecified atrial fibrillation: Secondary | ICD-10-CM | POA: Diagnosis not present

## 2015-08-29 DIAGNOSIS — Z95828 Presence of other vascular implants and grafts: Secondary | ICD-10-CM | POA: Diagnosis not present

## 2015-08-29 DIAGNOSIS — I4892 Unspecified atrial flutter: Secondary | ICD-10-CM | POA: Diagnosis not present

## 2015-08-29 DIAGNOSIS — I444 Left anterior fascicular block: Secondary | ICD-10-CM | POA: Diagnosis not present

## 2015-08-30 DIAGNOSIS — D899 Disorder involving the immune mechanism, unspecified: Secondary | ICD-10-CM | POA: Diagnosis not present

## 2015-08-30 DIAGNOSIS — Z4822 Encounter for aftercare following kidney transplant: Secondary | ICD-10-CM | POA: Diagnosis not present

## 2015-08-30 DIAGNOSIS — Z7982 Long term (current) use of aspirin: Secondary | ICD-10-CM | POA: Diagnosis not present

## 2015-08-30 DIAGNOSIS — D8989 Other specified disorders involving the immune mechanism, not elsewhere classified: Secondary | ICD-10-CM | POA: Diagnosis not present

## 2015-08-30 DIAGNOSIS — I120 Hypertensive chronic kidney disease with stage 5 chronic kidney disease or end stage renal disease: Secondary | ICD-10-CM | POA: Diagnosis not present

## 2015-08-30 DIAGNOSIS — Z888 Allergy status to other drugs, medicaments and biological substances status: Secondary | ICD-10-CM | POA: Diagnosis not present

## 2015-08-30 DIAGNOSIS — Z86718 Personal history of other venous thrombosis and embolism: Secondary | ICD-10-CM | POA: Diagnosis not present

## 2015-08-30 DIAGNOSIS — R0602 Shortness of breath: Secondary | ICD-10-CM | POA: Diagnosis not present

## 2015-08-30 DIAGNOSIS — Z9889 Other specified postprocedural states: Secondary | ICD-10-CM | POA: Diagnosis not present

## 2015-08-30 DIAGNOSIS — I4891 Unspecified atrial fibrillation: Secondary | ICD-10-CM | POA: Diagnosis not present

## 2015-08-30 DIAGNOSIS — Z8673 Personal history of transient ischemic attack (TIA), and cerebral infarction without residual deficits: Secondary | ICD-10-CM | POA: Diagnosis not present

## 2015-08-30 DIAGNOSIS — Z940 Kidney transplant status: Secondary | ICD-10-CM | POA: Diagnosis not present

## 2015-08-30 DIAGNOSIS — N186 End stage renal disease: Secondary | ICD-10-CM | POA: Diagnosis not present

## 2015-08-30 DIAGNOSIS — Z79899 Other long term (current) drug therapy: Secondary | ICD-10-CM | POA: Diagnosis not present

## 2015-09-06 DIAGNOSIS — Z4822 Encounter for aftercare following kidney transplant: Secondary | ICD-10-CM | POA: Diagnosis not present

## 2015-09-06 DIAGNOSIS — D8989 Other specified disorders involving the immune mechanism, not elsewhere classified: Secondary | ICD-10-CM | POA: Diagnosis not present

## 2015-09-06 DIAGNOSIS — Z9889 Other specified postprocedural states: Secondary | ICD-10-CM | POA: Diagnosis not present

## 2015-09-06 DIAGNOSIS — B259 Cytomegaloviral disease, unspecified: Secondary | ICD-10-CM | POA: Diagnosis not present

## 2015-09-06 DIAGNOSIS — I482 Chronic atrial fibrillation: Secondary | ICD-10-CM | POA: Diagnosis not present

## 2015-09-06 DIAGNOSIS — Z79899 Other long term (current) drug therapy: Secondary | ICD-10-CM | POA: Diagnosis not present

## 2015-09-06 DIAGNOSIS — Z992 Dependence on renal dialysis: Secondary | ICD-10-CM | POA: Diagnosis not present

## 2015-09-06 DIAGNOSIS — E785 Hyperlipidemia, unspecified: Secondary | ICD-10-CM | POA: Diagnosis not present

## 2015-09-06 DIAGNOSIS — D899 Disorder involving the immune mechanism, unspecified: Secondary | ICD-10-CM | POA: Diagnosis not present

## 2015-09-06 DIAGNOSIS — Z7982 Long term (current) use of aspirin: Secondary | ICD-10-CM | POA: Diagnosis not present

## 2015-09-06 DIAGNOSIS — Z888 Allergy status to other drugs, medicaments and biological substances status: Secondary | ICD-10-CM | POA: Diagnosis not present

## 2015-09-06 DIAGNOSIS — D649 Anemia, unspecified: Secondary | ICD-10-CM | POA: Diagnosis not present

## 2015-09-06 DIAGNOSIS — I10 Essential (primary) hypertension: Secondary | ICD-10-CM | POA: Diagnosis not present

## 2015-09-06 DIAGNOSIS — I4892 Unspecified atrial flutter: Secondary | ICD-10-CM | POA: Diagnosis not present

## 2015-09-06 DIAGNOSIS — I120 Hypertensive chronic kidney disease with stage 5 chronic kidney disease or end stage renal disease: Secondary | ICD-10-CM | POA: Diagnosis not present

## 2015-09-06 DIAGNOSIS — N186 End stage renal disease: Secondary | ICD-10-CM | POA: Diagnosis not present

## 2015-09-06 DIAGNOSIS — Z940 Kidney transplant status: Secondary | ICD-10-CM | POA: Diagnosis not present

## 2015-09-12 ENCOUNTER — Telehealth: Payer: Self-pay | Admitting: Behavioral Health

## 2015-09-12 ENCOUNTER — Encounter: Payer: Self-pay | Admitting: Behavioral Health

## 2015-09-12 DIAGNOSIS — I4891 Unspecified atrial fibrillation: Secondary | ICD-10-CM | POA: Diagnosis not present

## 2015-09-12 DIAGNOSIS — Z79899 Other long term (current) drug therapy: Secondary | ICD-10-CM | POA: Diagnosis not present

## 2015-09-12 DIAGNOSIS — N186 End stage renal disease: Secondary | ICD-10-CM | POA: Diagnosis not present

## 2015-09-12 DIAGNOSIS — D899 Disorder involving the immune mechanism, unspecified: Secondary | ICD-10-CM | POA: Diagnosis not present

## 2015-09-12 DIAGNOSIS — Z888 Allergy status to other drugs, medicaments and biological substances status: Secondary | ICD-10-CM | POA: Diagnosis not present

## 2015-09-12 DIAGNOSIS — I120 Hypertensive chronic kidney disease with stage 5 chronic kidney disease or end stage renal disease: Secondary | ICD-10-CM | POA: Diagnosis not present

## 2015-09-12 DIAGNOSIS — Z4822 Encounter for aftercare following kidney transplant: Secondary | ICD-10-CM | POA: Diagnosis not present

## 2015-09-12 DIAGNOSIS — Z940 Kidney transplant status: Secondary | ICD-10-CM | POA: Diagnosis not present

## 2015-09-12 NOTE — Telephone Encounter (Signed)
Pre-Visit Call completed with patient and chart updated.   Pre-Visit Info documented in Specialty Comments under [employer].    

## 2015-09-13 ENCOUNTER — Ambulatory Visit (INDEPENDENT_AMBULATORY_CARE_PROVIDER_SITE_OTHER): Payer: Medicare Other | Admitting: Family Medicine

## 2015-09-13 ENCOUNTER — Encounter: Payer: Self-pay | Admitting: Family Medicine

## 2015-09-13 VITALS — BP 124/72 | HR 71 | Temp 98.1°F | Ht 66.0 in | Wt 228.4 lb

## 2015-09-13 DIAGNOSIS — F329 Major depressive disorder, single episode, unspecified: Secondary | ICD-10-CM | POA: Diagnosis not present

## 2015-09-13 DIAGNOSIS — F32A Depression, unspecified: Secondary | ICD-10-CM

## 2015-09-13 DIAGNOSIS — Z0000 Encounter for general adult medical examination without abnormal findings: Secondary | ICD-10-CM

## 2015-09-13 DIAGNOSIS — Z940 Kidney transplant status: Secondary | ICD-10-CM | POA: Diagnosis not present

## 2015-09-13 DIAGNOSIS — I10 Essential (primary) hypertension: Secondary | ICD-10-CM

## 2015-09-13 MED ORDER — METOPROLOL TARTRATE 25 MG PO TABS: 37.5000 mg | ORAL_TABLET | Freq: Two times a day (BID) | ORAL | Status: DC

## 2015-09-13 MED ORDER — SERTRALINE HCL 50 MG PO TABS: ORAL_TABLET | ORAL | Status: DC

## 2015-09-13 MED ORDER — DILTIAZEM HCL ER COATED BEADS 240 MG PO CP24: 240.0000 mg | ORAL_CAPSULE | Freq: Every day | ORAL | Status: DC

## 2015-09-13 NOTE — Progress Notes (Signed)
Subjective:   [name] is a 70 y.o. female who presents for Medicare Annual (Subsequent) preventive examination.  Review of Systems:   Review of Systems  Constitutional: Negative for activity change, appetite change and fatigue.  HENT: Negative for hearing loss, congestion, tinnitus and ear discharge.   Eyes: Negative for visual disturbance (see optho q1y -- vision corrected to 20/20 with glasses).  Respiratory: Negative for cough, chest tightness and shortness of breath.   Cardiovascular: Negative for chest pain, palpitations and leg swelling.  Gastrointestinal: Negative for abdominal pain, diarrhea, constipation and abdominal distention.  Genitourinary: Negative for urgency, frequency, decreased urine volume and difficulty urinating.  Musculoskeletal: Negative for back pain, arthralgias and gait problem.  Skin: Negative for color change, pallor and rash.  Neurological: Negative for dizziness, light-headedness, numbness and headaches.  Hematological: Negative for adenopathy. Does not bruise/bleed easily.  Psychiatric/Behavioral: Negative for suicidal ideas, confusion, sleep disturbance, self-injury, dysphoric mood, decreased concentration and agitation.  Pt is able to read and write and can do all ADLs No risk for falling No abuse/ violence in home           Objective:     Vitals: BP 124/72 mmHg  Pulse 71  Temp(Src) 98.1 F (36.7 C) (Oral)  Ht 5\' 6"  (1.676 m)  Wt 228 lb 6.4 oz (103.602 kg)  BMI 36.88 kg/m2  SpO2 98%  Tobacco History  Smoking status  . Never Smoker   Smokeless tobacco  . Never Used     Counseling given: Not Answered   Past Medical History  Diagnosis Date  . Fractures, stress     in both feet--6 OR 7 YRS AGO--RESOLVED  . Stroke due to intracerebral hemorrhage ([place]) 2009  . Gout     doesn't require meds   . Anemia   . Blood transfusion     never had a reaction to blood transfusion  . Cardiomyopathy Mar 2013    Mild, EF 50-55% by Mar  2013 ECHO, diast dysfxn II  . Hypercalcemia     09/10/11  . HCAP (healthcare-associated pneumonia)     09/10/11  . Asthma     as a child  . Eczema   . Constipation     takes Miralax daily  . Oligouria   . Depression     takes Zoloft daily  . Peripheral vascular disease ([place])   . DVT of lower extremity, bilateral ([place]) 12/21/11    "they're there now; been there for 2 wks"  . Sleep apnea     HAS CPAP-sleep study done 2007--PT NOT USING AT PRESENT  . ESRD (end stage renal disease) on dialysis [facility]) 09/2011    Started dialysis Mar 2013 on M/W/F on [place]  . Arthritis     HIP  . Morbid obesity ([place])   . PAF (paroxysmal atrial fibrillation) ([facility])      HX OF CEREBRAL BLEED WHILE ON COUMADIN-SO PT NOT ON ANY BLOOD THINNERS NOW  . Hypertension     takes Diltiazem daily   . Stroke [facility]) 2009    denies residual, hemorrhagic now off coumadin  . Nonischemic dilated cardiomyopathy [facility])    Past Surgical History  Procedure Laterality Date  . 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 I&d extremity  09/15/2011    Procedure: IRRIGATION AND DEBRIDEMENT EXTREMITY w REMOVAL OF HARDWARE;  Surgeon: [name], MD;  Location: [place];  Service: Orthopedics;  Laterality: Right;  .  Av fistula placement  09/28/2011    Procedure: ARTERIOVENOUS (AV) FISTULA CREATION;  Surgeon: Rosetta Posner, MD;  Location: [place];  Service: Vascular;  Laterality: Left;  . Cystoscopy      many yrs ago  . Colonoscopy    . Irrigation and debridement abscess  12/21/11    right hip  . Vena cava filter placement  11/2011  . Total hip arthroplasty  02/2011    right THA 02/2011, I&D/removal of hardware 09/2011,, repeat I&D Jun 2013, reimplantation R THA 03-26-2012  . Femur fracture surgery  03/2011; 09/03/2011    right; "had 2, 2 wk apart in 2012; broke it again 08/2011 & had OR"  . Insertion of dialysis catheter      Procedure: INSERTION OF DIALYSIS CATHETER;  Surgeon: Rosetta Posner, MD;  Location: [place];  Service:  Vascular;  Laterality: Right;  . Cholecystectomy      1980's  . Incision and drainage hip  12/21/2011    Procedure: IRRIGATION AND DEBRIDEMENT HIP;  Surgeon: [name], MD;  Location: [place];  Service: Orthopedics;  Laterality: Right;  I&D RIGHT HIP WITH PLACEMENT ANTIBIOTIC SPACER  . Total hip revision  03/25/2012    Procedure: TOTAL HIP REVISION;  Surgeon: [name], MD;  Location: [facility];  Service: Orthopedics;  Laterality: Right;  Right Total Hip Reimplantation  . Kidney transplant  07/19/15   Family History  Problem Relation Age of Onset  . Diabetes Mother   . Hypertension Mother   . Hodgkin's lymphoma  32    dscd---HODGKINS DISEASE  . Cancer Father    History  Sexual Activity  . Sexual Activity: No    Outpatient Encounter Prescriptions as of 09/13/2015  Medication Sig  . acetaminophen (TYLENOL) 500 MG tablet Take 500 mg by mouth.  Marland Kitchen aspirin EC 81 MG tablet Take 81 mg by mouth.  . diltiazem (CARDIZEM CD) 240 MG 24 hr capsule Take 1 capsule (240 mg total) by mouth daily after breakfast.  . docusate sodium (COLACE) 100 MG capsule Take 100 mg by mouth daily as needed.   Marland Kitchen MAGNESIUM OXIDE PO Take 133 mg by mouth.  . metoprolol tartrate (LOPRESSOR) 25 MG tablet Take 37.5 mg by mouth.  . mycophenolate (MYFORTIC) 180 MG EC tablet Take 360 mg by mouth.  Marland Kitchen omeprazole (PRILOSEC) 20 MG capsule Take 20 mg by mouth.  . predniSONE (DELTASONE) 5 MG tablet Take 20 mg by mouth.  . sertraline (ZOLOFT) 50 MG tablet 1 tab by mouth daily  . tacrolimus (PROGRAF) 1 MG capsule Take 3 mg by mouth.  . valGANciclovir (VALCYTE) 450 MG tablet Take 450 mg by mouth.  . [DISCONTINUED] metoprolol tartrate (LOPRESSOR) 25 MG tablet Take 12.5 mg by mouth.  . [DISCONTINUED] sertraline (ZOLOFT) 50 MG tablet 1 tab by mouth daily---   No facility-administered encounter medications on file as of 09/13/2015.    Activities of Daily Living In your present state of health, do you have any difficulty performing  the following activities: 09/13/2015  Hearing? Y  Vision? N  Difficulty concentrating or making decisions? N  Walking or climbing stairs? Y  Dressing or bathing? N  Doing errands, shopping? Y    Patient Care Team: Rosalita Chessman, DO as PCP - General Sueanne Margarita, MD as Consulting Physician (Cardiology) Paralee Cancel, MD as Consulting Physician (Orthopedic Surgery) Fleet Contras, MD as Consulting Physician (Nephrology)    Assessment:    See AVS Exercise Activities and Dietary recommendations---- walk  Goals    None     Fall Risk Fall Risk  09/13/2015 03/02/2014 12/27/2011  Falls in the past year? No Yes -  Number falls in past yr: - 1 -  Injury with Fall? - No -  Risk for fall due to : - Other (Comment) Impaired mobility  Risk for fall due to (comments): - Passed out at the scale after dialysis -   Depression Screen PHQ 2/9 Scores 09/13/2015 03/02/2014  PHQ - 2 Score 0 0     Cognitive Testing mmse 30/30  Immunization History  Administered Date(s) Administered  . Influenza Split 03/27/2012  . Influenza Whole 04/29/2008  . Influenza,inj,Quad PF,36+ Mos 04/24/2013  . Influenza-Unspecified 04/29/2014, 03/17/2015  . Pneumococcal Conjugate-13 03/02/2014  . Pneumococcal Polysaccharide-23 04/25/2005  . Tdap 05/08/2013   Screening Tests Health Maintenance  Topic Date Due  . ZOSTAVAX  07/13/2008  . PNA vac Low Risk Adult (2 of 2 - PPSV23) 03/03/2015  . MAMMOGRAM  07/25/2015  . INFLUENZA VACCINE  02/14/2016  . COLONOSCOPY  07/29/2017  . TETANUS/TDAP  05/09/2023  . DEXA SCAN  Completed  . Hepatitis C Screening  Completed      Plan:    see AVS During the course of the visit the patient was educated and counseled about the following appropriate screening and preventive services:   Vaccines to include Pneumoccal, Influenza, Hepatitis B, Td, Zostavax, HCV  Electrocardiogram  Cardiovascular Disease  Colorectal cancer screening  Bone density  screening  Diabetes screening  Glaucoma screening  Mammography/PAP  Nutrition counseling   Patient Instructions (the written plan) was given to the patient.   1. Depression  - sertraline (ZOLOFT) 50 MG tablet; 1 tab by mouth daily  Dispense: 90 tablet; Refill: 3  2. Deceased-donor kidney transplant recipient Per nephrology  Garnet Koyanagi, DO  09/13/2015

## 2015-09-13 NOTE — Patient Instructions (Addendum)
Please ask the nephrologist to fax [name] your blood work        Preventive Care for Adults, Female A healthy lifestyle and preventive care can promote health and wellness. Preventive health guidelines for women include the following key practices.  A routine yearly physical is a good way to check with your health care provider about your health and preventive screening. It is a chance to share any concerns and updates on your health and to receive a thorough exam.  Visit your dentist for a routine exam and preventive care every 6 months. Brush your teeth twice a day and floss once a day. Good oral hygiene prevents tooth decay and gum disease.  The frequency of eye exams is based on your age, health, family medical history, use of contact lenses, and other factors. Follow your health care provider's recommendations for frequency of eye exams.  Eat a healthy diet. Foods like vegetables, fruits, whole grains, low-fat dairy products, and lean protein foods contain the nutrients you need without too many calories. Decrease your intake of foods high in solid fats, added sugars, and salt. Eat the right amount of calories for you.Get information about a proper diet from your health care provider, if necessary.  Regular physical exercise is one of the most important things you can do for your health. Most adults should get at least 150 minutes of moderate-intensity exercise (any activity that increases your heart rate and causes you to sweat) each week. In addition, most adults need muscle-strengthening exercises on 2 or more days a week.  Maintain a healthy weight. The body mass index (BMI) is a screening tool to identify possible weight problems. It provides an estimate of body fat based on height and weight. Your health care provider can find your BMI and can help you achieve or maintain a healthy weight.For adults 20 years and older:  A BMI below 18.5 is considered underweight.  A BMI of 18.5 to  24.9 is normal.  A BMI of 25 to 29.9 is considered overweight.  A BMI of 30 and above is considered obese.  Maintain normal blood lipids and cholesterol levels by exercising and minimizing your intake of saturated fat. Eat a balanced diet with plenty of fruit and vegetables. Blood tests for lipids and cholesterol should begin at age 14 and be repeated every 5 years. If your lipid or cholesterol levels are high, you are over 50, or you are at high risk for heart disease, you may need your cholesterol levels checked more frequently.Ongoing high lipid and cholesterol levels should be treated with medicines if diet and exercise are not working.  If you smoke, find out from your health care provider how to quit. If you do not use tobacco, do not start.  Lung cancer screening is recommended for adults aged 55-80 years who are at high risk for developing lung cancer because of a history of smoking. A yearly low-dose CT scan of the lungs is recommended for people who have at least a 30-pack-year history of smoking and are a current smoker or have quit within the past 15 years. A pack year of smoking is smoking an average of 1 pack of cigarettes a day for 1 year (for example: 1 pack a day for 30 years or 2 packs a day for 15 years). Yearly screening should continue until the smoker has stopped smoking for at least 15 years. Yearly screening should be stopped for people who develop a health problem that would prevent them  from having lung cancer treatment.  If you are pregnant, do not drink alcohol. If you are breastfeeding, be very cautious about drinking alcohol. If you are not pregnant and choose to drink alcohol, do not have more than 1 drink per day. One drink is considered to be 12 ounces (355 mL) of beer, 5 ounces (148 mL) of wine, or 1.5 ounces (44 mL) of liquor.  Avoid use of street drugs. Do not share needles with anyone. Ask for help if you need support or instructions about stopping the use of  drugs.  High blood pressure causes heart disease and increases the risk of stroke. Your blood pressure should be checked at least every 1 to 2 years. Ongoing high blood pressure should be treated with medicines if weight loss and exercise do not work.  If you are 11-90 years old, ask your health care provider if you should take aspirin to prevent strokes.  Diabetes screening is done by taking a blood sample to check your blood glucose level after you have not eaten for a certain period of time (fasting). If you are not overweight and you do not have risk factors for diabetes, you should be screened once every 3 years starting at age 50. If you are overweight or obese and you are 63-12 years of age, you should be screened for diabetes every year as part of your cardiovascular risk assessment.  Breast cancer screening is essential preventive care for women. You should practice "breast self-awareness." This means understanding the normal appearance and feel of your breasts and may include breast self-examination. Any changes detected, no matter how small, should be reported to a health care provider. Women in their 93s and 30s should have a clinical breast exam (CBE) by a health care provider as part of a regular health exam every 1 to 3 years. After age 19, women should have a CBE every year. Starting at age 51, women should consider having a mammogram (breast X-ray test) every year. Women who have a family history of breast cancer should talk to their health care provider about genetic screening. Women at a high risk of breast cancer should talk to their health care providers about having an MRI and a mammogram every year.  Breast cancer gene (BRCA)-related cancer risk assessment is recommended for women who have family members with BRCA-related cancers. BRCA-related cancers include breast, ovarian, tubal, and peritoneal cancers. Having family members with these cancers may be associated with an increased  risk for harmful changes (mutations) in the breast cancer genes BRCA1 and BRCA2. Results of the assessment will determine the need for genetic counseling and BRCA1 and BRCA2 testing.  Your health care provider may recommend that you be screened regularly for cancer of the pelvic organs (ovaries, uterus, and vagina). This screening involves a pelvic examination, including checking for microscopic changes to the surface of your cervix (Pap test). You may be encouraged to have this screening done every 3 years, beginning at age 20.  For women ages 65-65, health care providers may recommend pelvic exams and Pap testing every 3 years, or they may recommend the Pap and pelvic exam, combined with testing for human papilloma virus (HPV), every 5 years. Some types of HPV increase your risk of cervical cancer. Testing for HPV may also be done on women of any age with unclear Pap test results.  Other health care providers may not recommend any screening for nonpregnant women who are considered low risk for pelvic cancer and who  do not have symptoms. Ask your health care provider if a screening pelvic exam is right for you.  If you have had past treatment for cervical cancer or a condition that could lead to cancer, you need Pap tests and screening for cancer for at least 20 years after your treatment. If Pap tests have been discontinued, your risk factors (such as having a new sexual partner) need to be reassessed to determine if screening should resume. Some women have medical problems that increase the chance of getting cervical cancer. In these cases, your health care provider may recommend more frequent screening and Pap tests.  Colorectal cancer can be detected and often prevented. Most routine colorectal cancer screening begins at the age of 72 years and continues through age 45 years. However, your health care provider may recommend screening at an earlier age if you have risk factors for colon cancer. On a  yearly basis, your health care provider may provide home test kits to check for hidden blood in the stool. Use of a small camera at the end of a tube, to directly examine the colon (sigmoidoscopy or colonoscopy), can detect the earliest forms of colorectal cancer. Talk to your health care provider about this at age 66, when routine screening begins. Direct exam of the colon should be repeated every 5-10 years through age 24 years, unless early forms of precancerous polyps or small growths are found.  People who are at an increased risk for hepatitis B should be screened for this virus. You are considered at high risk for hepatitis B if:  You were born in a country where hepatitis B occurs often. Talk with your health care provider about which countries are considered high risk.  Your parents were born in a high-risk country and you have not received a shot to protect against hepatitis B (hepatitis B vaccine).  You have HIV or AIDS.  You use needles to inject street drugs.  You live with, or have sex with, someone who has hepatitis B.  You get hemodialysis treatment.  You take certain medicines for conditions like cancer, organ transplantation, and autoimmune conditions.  Hepatitis C blood testing is recommended for all people born from [birth year] through 1965 and any individual with known risks for hepatitis C.  Practice safe sex. Use condoms and avoid high-risk sexual practices to reduce the spread of sexually transmitted infections (STIs). STIs include gonorrhea, chlamydia, syphilis, trichomonas, herpes, HPV, and human immunodeficiency virus (HIV). Herpes, HIV, and HPV are viral illnesses that have no cure. They can result in disability, cancer, and death.  You should be screened for sexually transmitted illnesses (STIs) including gonorrhea and chlamydia if:  You are sexually active and are younger than 24 years.  You are older than 24 years and your health care provider tells you that you are  at risk for this type of infection.  Your sexual activity has changed since you were last screened and you are at an increased risk for chlamydia or gonorrhea. Ask your health care provider if you are at risk.  If you are at risk of being infected with HIV, it is recommended that you take a prescription medicine daily to prevent HIV infection. This is called preexposure prophylaxis (PrEP). You are considered at risk if:  You are sexually active and do not regularly use condoms or know the HIV status of your partner(s).  You take drugs by injection.  You are sexually active with a partner who has HIV.  Talk with  your health care provider about whether you are at high risk of being infected with HIV. If you choose to begin PrEP, you should first be tested for HIV. You should then be tested every 3 months for as long as you are taking PrEP.  Osteoporosis is a disease in which the bones lose minerals and strength with aging. This can result in serious bone fractures or breaks. The risk of osteoporosis can be identified using a bone density scan. Women ages 54 years and over and women at risk for fractures or osteoporosis should discuss screening with their health care providers. Ask your health care provider whether you should take a calcium supplement or vitamin D to reduce the rate of osteoporosis.  Menopause can be associated with physical symptoms and risks. Hormone replacement therapy is available to decrease symptoms and risks. You should talk to your health care provider about whether hormone replacement therapy is right for you.  Use sunscreen. Apply sunscreen liberally and repeatedly throughout the day. You should seek shade when your shadow is shorter than you. Protect yourself by wearing long sleeves, pants, a wide-brimmed hat, and sunglasses year round, whenever you are outdoors.  Once a month, do a whole body skin exam, using a mirror to look at the skin on your back. Tell your health  care provider of new moles, moles that have irregular borders, moles that are larger than a pencil eraser, or moles that have changed in shape or color.  Stay current with required vaccines (immunizations).  Influenza vaccine. All adults should be immunized every year.  Tetanus, diphtheria, and acellular pertussis (Td, Tdap) vaccine. Pregnant women should receive 1 dose of Tdap vaccine during each pregnancy. The dose should be obtained regardless of the length of time since the last dose. Immunization is preferred during the 27th-36th week of gestation. An adult who has not previously received Tdap or who does not know her vaccine status should receive 1 dose of Tdap. This initial dose should be followed by tetanus and diphtheria toxoids (Td) booster doses every 10 years. Adults with an unknown or incomplete history of completing a 3-dose immunization series with Td-containing vaccines should begin or complete a primary immunization series including a Tdap dose. Adults should receive a Td booster every 10 years.  Varicella vaccine. An adult without evidence of immunity to varicella should receive 2 doses or a second dose if she has previously received 1 dose. Pregnant females who do not have evidence of immunity should receive the first dose after pregnancy. This first dose should be obtained before leaving the health care facility. The second dose should be obtained 4-8 weeks after the first dose.  Human papillomavirus (HPV) vaccine. Females aged 13-26 years who have not received the vaccine previously should obtain the 3-dose series. The vaccine is not recommended for use in pregnant females. However, pregnancy testing is not needed before receiving a dose. If a female is found to be pregnant after receiving a dose, no treatment is needed. In that case, the remaining doses should be delayed until after the pregnancy. Immunization is recommended for any person with an immunocompromised condition through  the age of 26 years if she did not get any or all doses earlier. During the 3-dose series, the second dose should be obtained 4-8 weeks after the first dose. The third dose should be obtained 24 weeks after the first dose and 16 weeks after the second dose.  Zoster vaccine. One dose is recommended for adults aged 94  years or older unless certain conditions are present.  Measles, mumps, and rubella (MMR) vaccine. Adults born before [birth year] generally are considered immune to measles and mumps. Adults born in [birth year] or later should have 1 or more doses of MMR vaccine unless there is a contraindication to the vaccine or there is laboratory evidence of immunity to each of the three diseases. A routine second dose of MMR vaccine should be obtained at least 28 days after the first dose for students attending postsecondary schools, health care workers, or international travelers. People who received inactivated measles vaccine or an unknown type of measles vaccine during 1963-1967 should receive 2 doses of MMR vaccine. People who received inactivated mumps vaccine or an unknown type of mumps vaccine before 1979 and are at high risk for mumps infection should consider immunization with 2 doses of MMR vaccine. For females of childbearing age, rubella immunity should be determined. If there is no evidence of immunity, females who are not pregnant should be vaccinated. If there is no evidence of immunity, females who are pregnant should delay immunization until after pregnancy. Unvaccinated health care workers born before 40 who lack laboratory evidence of measles, mumps, or rubella immunity or laboratory confirmation of disease should consider measles and mumps immunization with 2 doses of MMR vaccine or rubella immunization with 1 dose of MMR vaccine.  Pneumococcal 13-valent conjugate (PCV13) vaccine. When indicated, a person who is uncertain of his immunization history and has no record of immunization should receive the  PCV13 vaccine. All adults 1 years of age and older should receive this vaccine. An adult aged 46 years or older who has certain medical conditions and has not been previously immunized should receive 1 dose of PCV13 vaccine. This PCV13 should be followed with a dose of pneumococcal polysaccharide (PPSV23) vaccine. Adults who are at high risk for pneumococcal disease should obtain the PPSV23 vaccine at least 8 weeks after the dose of PCV13 vaccine. Adults older than 68 years of age who have normal immune system function should obtain the PPSV23 vaccine dose at least 1 year after the dose of PCV13 vaccine.  Pneumococcal polysaccharide (PPSV23) vaccine. When PCV13 is also indicated, PCV13 should be obtained first. All adults aged 32 years and older should be immunized. An adult younger than age 45 years who has certain medical conditions should be immunized. Any person who resides in a nursing home or long-term care facility should be immunized. An adult smoker should be immunized. People with an immunocompromised condition and certain other conditions should receive both PCV13 and PPSV23 vaccines. People with human immunodeficiency virus (HIV) infection should be immunized as soon as possible after diagnosis. Immunization during chemotherapy or radiation therapy should be avoided. Routine use of PPSV23 vaccine is not recommended for American Indians, [place] Natives, or people younger than 65 years unless there are medical conditions that require PPSV23 vaccine. When indicated, people who have unknown immunization and have no record of immunization should receive PPSV23 vaccine. One-time revaccination 5 years after the first dose of PPSV23 is recommended for people aged 19-64 years who have chronic kidney failure, nephrotic syndrome, asplenia, or immunocompromised conditions. People who received 1-2 doses of PPSV23 before age 39 years should receive another dose of PPSV23 vaccine at age 27 years or later if at least  5 years have passed since the previous dose. Doses of PPSV23 are not needed for people immunized with PPSV23 at or after age 100 years.  Meningococcal vaccine. Adults with asplenia or persistent complement  component deficiencies should receive 2 doses of quadrivalent meningococcal conjugate (MenACWY-D) vaccine. The doses should be obtained at least 2 months apart. Microbiologists working with certain meningococcal bacteria, [place] recruits, people at risk during an outbreak, and people who travel to or live in countries with a high rate of meningitis should be immunized. A first-year college student up through age 88 years who is living in a residence hall should receive a dose if she did not receive a dose on or after her 16th birthday. Adults who have certain high-risk conditions should receive one or more doses of vaccine.  Hepatitis A vaccine. Adults who wish to be protected from this disease, have certain high-risk conditions, work with hepatitis A-infected animals, work in hepatitis A research labs, or travel to or work in countries with a high rate of hepatitis A should be immunized. Adults who were previously unvaccinated and who anticipate close contact with an international adoptee during the first 60 days after arrival in the [place] from a country with a high rate of hepatitis A should be immunized.  Hepatitis B vaccine. Adults who wish to be protected from this disease, have certain high-risk conditions, may be exposed to blood or other infectious body fluids, are household contacts or sex partners of hepatitis B positive people, are clients or workers in certain care facilities, or travel to or work in countries with a high rate of hepatitis B should be immunized.  Haemophilus influenzae type b (Hib) vaccine. A previously unvaccinated person with asplenia or sickle cell disease or having a scheduled splenectomy should receive 1 dose of Hib vaccine. Regardless of previous immunization, a  recipient of a hematopoietic stem cell transplant should receive a 3-dose series 6-12 months after her successful transplant. Hib vaccine is not recommended for adults with HIV infection. Preventive Services / Frequency Ages 28 to 75 years  Blood pressure check.** / Every 3-5 years.  Lipid and cholesterol check.** / Every 5 years beginning at age 69.  Clinical breast exam.** / Every 3 years for women in their 37s and 34s.  BRCA-related cancer risk assessment.** / For women who have family members with a BRCA-related cancer (breast, ovarian, tubal, or peritoneal cancers).  Pap test.** / Every 2 years from ages 84 through 75. Every 3 years starting at age 82 through age 48 or 61 with a history of 3 consecutive normal Pap tests.  HPV screening.** / Every 3 years from ages 46 through ages 37 to 60 with a history of 3 consecutive normal Pap tests.  Hepatitis C blood test.** / For any individual with known risks for hepatitis C.  Skin self-exam. / Monthly.  Influenza vaccine. / Every year.  Tetanus, diphtheria, and acellular pertussis (Tdap, Td) vaccine.** / Consult your health care provider. Pregnant women should receive 1 dose of Tdap vaccine during each pregnancy. 1 dose of Td every 10 years.  Varicella vaccine.** / Consult your health care provider. Pregnant females who do not have evidence of immunity should receive the first dose after pregnancy.  HPV vaccine. / 3 doses over 6 months, if 73 and younger. The vaccine is not recommended for use in pregnant females. However, pregnancy testing is not needed before receiving a dose.  Measles, mumps, rubella (MMR) vaccine.** / You need at least 1 dose of MMR if you were born in 1957 or later. You may also need a 2nd dose. For females of childbearing age, rubella immunity should be determined. If there is no evidence of immunity, females  who are not pregnant should be vaccinated. If there is no evidence of immunity, females who are pregnant  should delay immunization until after pregnancy.  Pneumococcal 13-valent conjugate (PCV13) vaccine.** / Consult your health care provider.  Pneumococcal polysaccharide (PPSV23) vaccine.** / 1 to 2 doses if you smoke cigarettes or if you have certain conditions.  Meningococcal vaccine.** / 1 dose if you are age 50 to 62 years and a Orthoptist living in a residence hall, or have one of several medical conditions, you need to get vaccinated against meningococcal disease. You may also need additional booster doses.  Hepatitis A vaccine.** / Consult your health care provider.  Hepatitis B vaccine.** / Consult your health care provider.  Haemophilus influenzae type b (Hib) vaccine.** / Consult your health care provider. Ages 16 to 64 years  Blood pressure check.** / Every year.  Lipid and cholesterol check.** / Every 5 years beginning at age 60 years.  Lung cancer screening. / Every year if you are aged 55-80 years and have a 30-pack-year history of smoking and currently smoke or have quit within the past 15 years. Yearly screening is stopped once you have quit smoking for at least 15 years or develop a health problem that would prevent you from having lung cancer treatment.  Clinical breast exam.** / Every year after age 55 years.  BRCA-related cancer risk assessment.** / For women who have family members with a BRCA-related cancer (breast, ovarian, tubal, or peritoneal cancers).  Mammogram.** / Every year beginning at age 79 years and continuing for as long as you are in good health. Consult with your health care provider.  Pap test.** / Every 3 years starting at age 41 years through age 22 or 35 years with a history of 3 consecutive normal Pap tests.  HPV screening.** / Every 3 years from ages 13 years through ages 5 to 30 years with a history of 3 consecutive normal Pap tests.  Fecal occult blood test (FOBT) of stool. / Every year beginning at age 6 years and  continuing until age 44 years. You may not need to do this test if you get a colonoscopy every 10 years.  Flexible sigmoidoscopy or colonoscopy.** / Every 5 years for a flexible sigmoidoscopy or every 10 years for a colonoscopy beginning at age 57 years and continuing until age 74 years.  Hepatitis C blood test.** / For all people born from [birth year] through 1963 and any individual with known risks for hepatitis C.  Skin self-exam. / Monthly.  Influenza vaccine. / Every year.  Tetanus, diphtheria, and acellular pertussis (Tdap/Td) vaccine.** / Consult your health care provider. Pregnant women should receive 1 dose of Tdap vaccine during each pregnancy. 1 dose of Td every 10 years.  Varicella vaccine.** / Consult your health care provider. Pregnant females who do not have evidence of immunity should receive the first dose after pregnancy.  Zoster vaccine.** / 1 dose for adults aged 39 years or older.  Measles, mumps, rubella (MMR) vaccine.** / You need at least 1 dose of MMR if you were born in 1957 or later. You may also need a second dose. For females of childbearing age, rubella immunity should be determined. If there is no evidence of immunity, females who are not pregnant should be vaccinated. If there is no evidence of immunity, females who are pregnant should delay immunization until after pregnancy.  Pneumococcal 13-valent conjugate (PCV13) vaccine.** / Consult your health care provider.  Pneumococcal polysaccharide (PPSV23) vaccine.** / 1  to 2 doses if you smoke cigarettes or if you have certain conditions.  Meningococcal vaccine.** / Consult your health care provider.  Hepatitis A vaccine.** / Consult your health care provider.  Hepatitis B vaccine.** / Consult your health care provider.  Haemophilus influenzae type b (Hib) vaccine.** / Consult your health care provider. Ages 1 years and over  Blood pressure check.** / Every year.  Lipid and cholesterol check.** / Every 5 years  beginning at age 8 years.  Lung cancer screening. / Every year if you are aged 39-80 years and have a 30-pack-year history of smoking and currently smoke or have quit within the past 15 years. Yearly screening is stopped once you have quit smoking for at least 15 years or develop a health problem that would prevent you from having lung cancer treatment.  Clinical breast exam.** / Every year after age 23 years.  BRCA-related cancer risk assessment.** / For women who have family members with a BRCA-related cancer (breast, ovarian, tubal, or peritoneal cancers).  Mammogram.** / Every year beginning at age 62 years and continuing for as long as you are in good health. Consult with your health care provider.  Pap test.** / Every 3 years starting at age 85 years through age 24 or 53 years with 3 consecutive normal Pap tests. Testing can be stopped between 65 and 70 years with 3 consecutive normal Pap tests and no abnormal Pap or HPV tests in the past 10 years.  HPV screening.** / Every 3 years from ages 76 years through ages 28 or 49 years with a history of 3 consecutive normal Pap tests. Testing can be stopped between 65 and 70 years with 3 consecutive normal Pap tests and no abnormal Pap or HPV tests in the past 10 years.  Fecal occult blood test (FOBT) of stool. / Every year beginning at age 26 years and continuing until age 62 years. You may not need to do this test if you get a colonoscopy every 10 years.  Flexible sigmoidoscopy or colonoscopy.** / Every 5 years for a flexible sigmoidoscopy or every 10 years for a colonoscopy beginning at age 25 years and continuing until age 37 years.  Hepatitis C blood test.** / For all people born from [birth year] through 1963 and any individual with known risks for hepatitis C.  Osteoporosis screening.** / A one-time screening for women ages 84 years and over and women at risk for fractures or osteoporosis.  Skin self-exam. / Monthly.  Influenza vaccine. /  Every year.  Tetanus, diphtheria, and acellular pertussis (Tdap/Td) vaccine.** / 1 dose of Td every 10 years.  Varicella vaccine.** / Consult your health care provider.  Zoster vaccine.** / 1 dose for adults aged 43 years or older.  Pneumococcal 13-valent conjugate (PCV13) vaccine.** / Consult your health care provider.  Pneumococcal polysaccharide (PPSV23) vaccine.** / 1 dose for all adults aged 67 years and older.  Meningococcal vaccine.** / Consult your health care provider.  Hepatitis A vaccine.** / Consult your health care provider.  Hepatitis B vaccine.** / Consult your health care provider.  Haemophilus influenzae type b (Hib) vaccine.** / Consult your health care provider. ** Family history and personal history of risk and conditions may change your health care provider's recommendations.   This information is not intended to replace advice given to you by your health care provider. Make sure you discuss any questions you have with your health care provider.   Document Released: 08/28/2001 Document Revised: 07/23/2014 Document Reviewed: 11/27/2010 Elsevier Interactive  Patient Education 2016 [employer].

## 2015-09-13 NOTE — Progress Notes (Signed)
Pre visit review using our clinic review tool, if applicable. No additional management support is needed unless otherwise documented below in the visit note. 

## 2015-09-21 DIAGNOSIS — I4891 Unspecified atrial fibrillation: Secondary | ICD-10-CM | POA: Diagnosis not present

## 2015-09-21 DIAGNOSIS — I120 Hypertensive chronic kidney disease with stage 5 chronic kidney disease or end stage renal disease: Secondary | ICD-10-CM | POA: Diagnosis not present

## 2015-09-21 DIAGNOSIS — Z888 Allergy status to other drugs, medicaments and biological substances status: Secondary | ICD-10-CM | POA: Diagnosis not present

## 2015-09-21 DIAGNOSIS — D649 Anemia, unspecified: Secondary | ICD-10-CM | POA: Diagnosis not present

## 2015-09-21 DIAGNOSIS — I4892 Unspecified atrial flutter: Secondary | ICD-10-CM | POA: Diagnosis not present

## 2015-09-21 DIAGNOSIS — B259 Cytomegaloviral disease, unspecified: Secondary | ICD-10-CM | POA: Diagnosis not present

## 2015-09-21 DIAGNOSIS — Z8673 Personal history of transient ischemic attack (TIA), and cerebral infarction without residual deficits: Secondary | ICD-10-CM | POA: Diagnosis not present

## 2015-09-21 DIAGNOSIS — E785 Hyperlipidemia, unspecified: Secondary | ICD-10-CM | POA: Diagnosis not present

## 2015-09-21 DIAGNOSIS — Z4822 Encounter for aftercare following kidney transplant: Secondary | ICD-10-CM | POA: Diagnosis not present

## 2015-09-21 DIAGNOSIS — D899 Disorder involving the immune mechanism, unspecified: Secondary | ICD-10-CM | POA: Diagnosis not present

## 2015-09-21 DIAGNOSIS — Z7982 Long term (current) use of aspirin: Secondary | ICD-10-CM | POA: Diagnosis not present

## 2015-09-21 DIAGNOSIS — Z79899 Other long term (current) drug therapy: Secondary | ICD-10-CM | POA: Diagnosis not present

## 2015-09-21 DIAGNOSIS — N186 End stage renal disease: Secondary | ICD-10-CM | POA: Diagnosis not present

## 2015-09-21 DIAGNOSIS — G4733 Obstructive sleep apnea (adult) (pediatric): Secondary | ICD-10-CM | POA: Diagnosis not present

## 2015-09-21 DIAGNOSIS — Z940 Kidney transplant status: Secondary | ICD-10-CM | POA: Diagnosis not present

## 2015-09-21 DIAGNOSIS — I10 Essential (primary) hypertension: Secondary | ICD-10-CM | POA: Diagnosis not present

## 2015-09-21 DIAGNOSIS — D8989 Other specified disorders involving the immune mechanism, not elsewhere classified: Secondary | ICD-10-CM | POA: Diagnosis not present

## 2015-09-21 DIAGNOSIS — Z86718 Personal history of other venous thrombosis and embolism: Secondary | ICD-10-CM | POA: Diagnosis not present

## 2015-09-21 DIAGNOSIS — J45909 Unspecified asthma, uncomplicated: Secondary | ICD-10-CM | POA: Diagnosis not present

## 2015-10-03 DIAGNOSIS — T45515A Adverse effect of anticoagulants, initial encounter: Secondary | ICD-10-CM | POA: Diagnosis not present

## 2015-10-03 DIAGNOSIS — R600 Localized edema: Secondary | ICD-10-CM | POA: Diagnosis not present

## 2015-10-03 DIAGNOSIS — B259 Cytomegaloviral disease, unspecified: Secondary | ICD-10-CM

## 2015-10-03 DIAGNOSIS — Z87440 Personal history of urinary (tract) infections: Secondary | ICD-10-CM | POA: Diagnosis not present

## 2015-10-03 DIAGNOSIS — M7989 Other specified soft tissue disorders: Secondary | ICD-10-CM | POA: Diagnosis not present

## 2015-10-03 DIAGNOSIS — Z9884 Bariatric surgery status: Secondary | ICD-10-CM | POA: Diagnosis not present

## 2015-10-03 DIAGNOSIS — N186 End stage renal disease: Secondary | ICD-10-CM | POA: Diagnosis not present

## 2015-10-03 DIAGNOSIS — M79604 Pain in right leg: Secondary | ICD-10-CM | POA: Diagnosis not present

## 2015-10-03 DIAGNOSIS — Z79899 Other long term (current) drug therapy: Secondary | ICD-10-CM | POA: Diagnosis not present

## 2015-10-03 DIAGNOSIS — Z7982 Long term (current) use of aspirin: Secondary | ICD-10-CM | POA: Diagnosis not present

## 2015-10-03 DIAGNOSIS — D6832 Hemorrhagic disorder due to extrinsic circulating anticoagulants: Secondary | ICD-10-CM | POA: Diagnosis not present

## 2015-10-03 DIAGNOSIS — Z4822 Encounter for aftercare following kidney transplant: Secondary | ICD-10-CM | POA: Diagnosis not present

## 2015-10-03 DIAGNOSIS — I120 Hypertensive chronic kidney disease with stage 5 chronic kidney disease or end stage renal disease: Secondary | ICD-10-CM | POA: Diagnosis not present

## 2015-10-03 DIAGNOSIS — D899 Disorder involving the immune mechanism, unspecified: Secondary | ICD-10-CM | POA: Diagnosis not present

## 2015-10-03 DIAGNOSIS — Z940 Kidney transplant status: Secondary | ICD-10-CM | POA: Diagnosis not present

## 2015-10-03 DIAGNOSIS — Z9889 Other specified postprocedural states: Secondary | ICD-10-CM | POA: Diagnosis not present

## 2015-10-03 DIAGNOSIS — I4891 Unspecified atrial fibrillation: Secondary | ICD-10-CM | POA: Diagnosis not present

## 2015-10-03 DIAGNOSIS — Z86718 Personal history of other venous thrombosis and embolism: Secondary | ICD-10-CM | POA: Diagnosis not present

## 2015-10-03 DIAGNOSIS — I10 Essential (primary) hypertension: Secondary | ICD-10-CM | POA: Diagnosis not present

## 2015-10-03 DIAGNOSIS — Z888 Allergy status to other drugs, medicaments and biological substances status: Secondary | ICD-10-CM | POA: Diagnosis not present

## 2015-10-03 HISTORY — DX: Cytomegaloviral disease, unspecified: B25.9

## 2015-10-12 DIAGNOSIS — Z940 Kidney transplant status: Secondary | ICD-10-CM | POA: Diagnosis not present

## 2015-10-12 DIAGNOSIS — N186 End stage renal disease: Secondary | ICD-10-CM | POA: Diagnosis not present

## 2015-10-12 DIAGNOSIS — Z09 Encounter for follow-up examination after completed treatment for conditions other than malignant neoplasm: Secondary | ICD-10-CM | POA: Diagnosis not present

## 2015-10-17 DIAGNOSIS — I120 Hypertensive chronic kidney disease with stage 5 chronic kidney disease or end stage renal disease: Secondary | ICD-10-CM | POA: Diagnosis not present

## 2015-10-17 DIAGNOSIS — Z940 Kidney transplant status: Secondary | ICD-10-CM | POA: Diagnosis not present

## 2015-10-17 DIAGNOSIS — Z888 Allergy status to other drugs, medicaments and biological substances status: Secondary | ICD-10-CM | POA: Diagnosis not present

## 2015-10-17 DIAGNOSIS — R600 Localized edema: Secondary | ICD-10-CM | POA: Diagnosis not present

## 2015-10-17 DIAGNOSIS — Z86718 Personal history of other venous thrombosis and embolism: Secondary | ICD-10-CM | POA: Diagnosis not present

## 2015-10-17 DIAGNOSIS — Z79899 Other long term (current) drug therapy: Secondary | ICD-10-CM | POA: Diagnosis not present

## 2015-10-17 DIAGNOSIS — I10 Essential (primary) hypertension: Secondary | ICD-10-CM | POA: Diagnosis not present

## 2015-10-17 DIAGNOSIS — Z992 Dependence on renal dialysis: Secondary | ICD-10-CM | POA: Diagnosis not present

## 2015-10-17 DIAGNOSIS — B259 Cytomegaloviral disease, unspecified: Secondary | ICD-10-CM | POA: Diagnosis not present

## 2015-10-17 DIAGNOSIS — M7989 Other specified soft tissue disorders: Secondary | ICD-10-CM | POA: Diagnosis not present

## 2015-10-17 DIAGNOSIS — D8989 Other specified disorders involving the immune mechanism, not elsewhere classified: Secondary | ICD-10-CM | POA: Diagnosis not present

## 2015-10-17 DIAGNOSIS — N186 End stage renal disease: Secondary | ICD-10-CM | POA: Diagnosis not present

## 2015-10-17 DIAGNOSIS — Z4822 Encounter for aftercare following kidney transplant: Secondary | ICD-10-CM | POA: Diagnosis not present

## 2015-10-17 DIAGNOSIS — Z9889 Other specified postprocedural states: Secondary | ICD-10-CM | POA: Diagnosis not present

## 2015-10-17 DIAGNOSIS — D649 Anemia, unspecified: Secondary | ICD-10-CM | POA: Diagnosis not present

## 2015-10-17 DIAGNOSIS — E785 Hyperlipidemia, unspecified: Secondary | ICD-10-CM | POA: Diagnosis not present

## 2015-10-17 DIAGNOSIS — D899 Disorder involving the immune mechanism, unspecified: Secondary | ICD-10-CM | POA: Diagnosis not present

## 2015-10-17 DIAGNOSIS — J45909 Unspecified asthma, uncomplicated: Secondary | ICD-10-CM | POA: Diagnosis not present

## 2015-10-17 DIAGNOSIS — I4891 Unspecified atrial fibrillation: Secondary | ICD-10-CM | POA: Diagnosis not present

## 2015-10-17 DIAGNOSIS — Z7982 Long term (current) use of aspirin: Secondary | ICD-10-CM | POA: Diagnosis not present

## 2015-10-17 DIAGNOSIS — Z8673 Personal history of transient ischemic attack (TIA), and cerebral infarction without residual deficits: Secondary | ICD-10-CM | POA: Diagnosis not present

## 2015-10-26 DIAGNOSIS — Z940 Kidney transplant status: Secondary | ICD-10-CM | POA: Diagnosis not present

## 2015-10-31 DIAGNOSIS — Z940 Kidney transplant status: Secondary | ICD-10-CM | POA: Diagnosis not present

## 2015-11-08 DIAGNOSIS — M79661 Pain in right lower leg: Secondary | ICD-10-CM | POA: Diagnosis not present

## 2015-11-09 ENCOUNTER — Inpatient Hospital Stay (HOSPITAL_COMMUNITY): Admission: RE | Admit: 2015-11-09 | Payer: Medicare Other | Source: Ambulatory Visit

## 2015-11-10 ENCOUNTER — Encounter (HOSPITAL_COMMUNITY): Admission: RE | Payer: Self-pay | Source: Ambulatory Visit

## 2015-11-10 ENCOUNTER — Inpatient Hospital Stay (HOSPITAL_COMMUNITY): Admission: RE | Admit: 2015-11-10 | Payer: Medicare Other | Source: Ambulatory Visit | Admitting: Orthopedic Surgery

## 2015-11-10 SURGERY — IRRIGATION AND DEBRIDEMENT EXTREMITY
Anesthesia: Choice | Site: Leg Lower | Laterality: Right

## 2015-11-16 DIAGNOSIS — M79661 Pain in right lower leg: Secondary | ICD-10-CM | POA: Diagnosis not present

## 2015-11-16 DIAGNOSIS — Z471 Aftercare following joint replacement surgery: Secondary | ICD-10-CM | POA: Diagnosis not present

## 2015-11-16 DIAGNOSIS — Z96641 Presence of right artificial hip joint: Secondary | ICD-10-CM | POA: Diagnosis not present

## 2015-11-16 DIAGNOSIS — S8011XD Contusion of right lower leg, subsequent encounter: Secondary | ICD-10-CM | POA: Diagnosis not present

## 2015-11-17 DIAGNOSIS — Z940 Kidney transplant status: Secondary | ICD-10-CM | POA: Diagnosis not present

## 2015-11-17 DIAGNOSIS — I10 Essential (primary) hypertension: Secondary | ICD-10-CM | POA: Diagnosis not present

## 2015-11-17 DIAGNOSIS — D899 Disorder involving the immune mechanism, unspecified: Secondary | ICD-10-CM | POA: Diagnosis not present

## 2015-11-17 DIAGNOSIS — I4892 Unspecified atrial flutter: Secondary | ICD-10-CM | POA: Diagnosis not present

## 2015-11-17 DIAGNOSIS — I4891 Unspecified atrial fibrillation: Secondary | ICD-10-CM | POA: Diagnosis not present

## 2015-11-17 DIAGNOSIS — Z7982 Long term (current) use of aspirin: Secondary | ICD-10-CM | POA: Diagnosis not present

## 2015-11-17 DIAGNOSIS — Z86718 Personal history of other venous thrombosis and embolism: Secondary | ICD-10-CM | POA: Diagnosis not present

## 2015-11-17 DIAGNOSIS — Z9889 Other specified postprocedural states: Secondary | ICD-10-CM | POA: Diagnosis not present

## 2015-11-17 DIAGNOSIS — Z992 Dependence on renal dialysis: Secondary | ICD-10-CM | POA: Diagnosis not present

## 2015-11-17 DIAGNOSIS — Z888 Allergy status to other drugs, medicaments and biological substances status: Secondary | ICD-10-CM | POA: Diagnosis not present

## 2015-11-17 DIAGNOSIS — B259 Cytomegaloviral disease, unspecified: Secondary | ICD-10-CM | POA: Diagnosis not present

## 2015-11-17 DIAGNOSIS — Z79899 Other long term (current) drug therapy: Secondary | ICD-10-CM | POA: Diagnosis not present

## 2015-11-17 DIAGNOSIS — Z4822 Encounter for aftercare following kidney transplant: Secondary | ICD-10-CM | POA: Diagnosis not present

## 2015-11-17 DIAGNOSIS — D649 Anemia, unspecified: Secondary | ICD-10-CM | POA: Diagnosis not present

## 2015-11-17 DIAGNOSIS — Z7952 Long term (current) use of systemic steroids: Secondary | ICD-10-CM | POA: Diagnosis not present

## 2015-11-17 DIAGNOSIS — Z87440 Personal history of urinary (tract) infections: Secondary | ICD-10-CM | POA: Diagnosis not present

## 2015-11-17 DIAGNOSIS — E785 Hyperlipidemia, unspecified: Secondary | ICD-10-CM | POA: Diagnosis not present

## 2015-11-17 DIAGNOSIS — I120 Hypertensive chronic kidney disease with stage 5 chronic kidney disease or end stage renal disease: Secondary | ICD-10-CM | POA: Diagnosis not present

## 2015-11-17 DIAGNOSIS — N186 End stage renal disease: Secondary | ICD-10-CM | POA: Diagnosis not present

## 2015-12-22 DIAGNOSIS — R413 Other amnesia: Secondary | ICD-10-CM | POA: Diagnosis not present

## 2015-12-22 DIAGNOSIS — Z7982 Long term (current) use of aspirin: Secondary | ICD-10-CM | POA: Diagnosis not present

## 2015-12-22 DIAGNOSIS — Z4822 Encounter for aftercare following kidney transplant: Secondary | ICD-10-CM | POA: Diagnosis not present

## 2015-12-22 DIAGNOSIS — R600 Localized edema: Secondary | ICD-10-CM | POA: Diagnosis not present

## 2015-12-22 DIAGNOSIS — D899 Disorder involving the immune mechanism, unspecified: Secondary | ICD-10-CM | POA: Diagnosis not present

## 2015-12-22 DIAGNOSIS — Z940 Kidney transplant status: Secondary | ICD-10-CM | POA: Diagnosis not present

## 2015-12-22 DIAGNOSIS — B259 Cytomegaloviral disease, unspecified: Secondary | ICD-10-CM | POA: Diagnosis not present

## 2015-12-22 DIAGNOSIS — E213 Hyperparathyroidism, unspecified: Secondary | ICD-10-CM | POA: Diagnosis not present

## 2015-12-22 DIAGNOSIS — I120 Hypertensive chronic kidney disease with stage 5 chronic kidney disease or end stage renal disease: Secondary | ICD-10-CM | POA: Diagnosis not present

## 2015-12-22 DIAGNOSIS — Z888 Allergy status to other drugs, medicaments and biological substances status: Secondary | ICD-10-CM | POA: Diagnosis not present

## 2015-12-22 DIAGNOSIS — N390 Urinary tract infection, site not specified: Secondary | ICD-10-CM | POA: Diagnosis not present

## 2015-12-22 DIAGNOSIS — D649 Anemia, unspecified: Secondary | ICD-10-CM | POA: Diagnosis not present

## 2015-12-22 DIAGNOSIS — I4891 Unspecified atrial fibrillation: Secondary | ICD-10-CM | POA: Diagnosis not present

## 2015-12-22 DIAGNOSIS — B258 Other cytomegaloviral diseases: Secondary | ICD-10-CM | POA: Diagnosis not present

## 2015-12-22 DIAGNOSIS — Z86718 Personal history of other venous thrombosis and embolism: Secondary | ICD-10-CM | POA: Diagnosis not present

## 2015-12-22 DIAGNOSIS — Z8673 Personal history of transient ischemic attack (TIA), and cerebral infarction without residual deficits: Secondary | ICD-10-CM | POA: Diagnosis not present

## 2015-12-22 DIAGNOSIS — I4892 Unspecified atrial flutter: Secondary | ICD-10-CM | POA: Diagnosis not present

## 2015-12-22 DIAGNOSIS — Z79899 Other long term (current) drug therapy: Secondary | ICD-10-CM | POA: Diagnosis not present

## 2015-12-22 DIAGNOSIS — I482 Chronic atrial fibrillation: Secondary | ICD-10-CM | POA: Diagnosis not present

## 2015-12-22 DIAGNOSIS — E785 Hyperlipidemia, unspecified: Secondary | ICD-10-CM | POA: Diagnosis not present

## 2015-12-22 DIAGNOSIS — Z9889 Other specified postprocedural states: Secondary | ICD-10-CM | POA: Diagnosis not present

## 2015-12-22 DIAGNOSIS — N186 End stage renal disease: Secondary | ICD-10-CM | POA: Diagnosis not present

## 2016-01-10 DIAGNOSIS — S8011XD Contusion of right lower leg, subsequent encounter: Secondary | ICD-10-CM | POA: Diagnosis not present

## 2016-01-18 DIAGNOSIS — M5416 Radiculopathy, lumbar region: Secondary | ICD-10-CM | POA: Diagnosis not present

## 2016-01-18 DIAGNOSIS — M545 Low back pain: Secondary | ICD-10-CM | POA: Diagnosis not present

## 2016-01-25 DIAGNOSIS — M545 Low back pain: Secondary | ICD-10-CM | POA: Diagnosis not present

## 2016-01-25 DIAGNOSIS — M5416 Radiculopathy, lumbar region: Secondary | ICD-10-CM | POA: Diagnosis not present

## 2016-01-26 DIAGNOSIS — E669 Obesity, unspecified: Secondary | ICD-10-CM | POA: Diagnosis not present

## 2016-01-26 DIAGNOSIS — D631 Anemia in chronic kidney disease: Secondary | ICD-10-CM | POA: Diagnosis not present

## 2016-01-26 DIAGNOSIS — Z6834 Body mass index (BMI) 34.0-34.9, adult: Secondary | ICD-10-CM | POA: Diagnosis not present

## 2016-01-26 DIAGNOSIS — D899 Disorder involving the immune mechanism, unspecified: Secondary | ICD-10-CM | POA: Diagnosis not present

## 2016-01-26 DIAGNOSIS — Z86718 Personal history of other venous thrombosis and embolism: Secondary | ICD-10-CM | POA: Diagnosis not present

## 2016-01-26 DIAGNOSIS — Z9884 Bariatric surgery status: Secondary | ICD-10-CM | POA: Diagnosis not present

## 2016-01-26 DIAGNOSIS — E785 Hyperlipidemia, unspecified: Secondary | ICD-10-CM | POA: Diagnosis not present

## 2016-01-26 DIAGNOSIS — J45909 Unspecified asthma, uncomplicated: Secondary | ICD-10-CM | POA: Diagnosis not present

## 2016-01-26 DIAGNOSIS — Z992 Dependence on renal dialysis: Secondary | ICD-10-CM | POA: Diagnosis not present

## 2016-01-26 DIAGNOSIS — Z4822 Encounter for aftercare following kidney transplant: Secondary | ICD-10-CM | POA: Diagnosis not present

## 2016-01-26 DIAGNOSIS — Z7952 Long term (current) use of systemic steroids: Secondary | ICD-10-CM | POA: Diagnosis not present

## 2016-01-26 DIAGNOSIS — Z7901 Long term (current) use of anticoagulants: Secondary | ICD-10-CM | POA: Diagnosis not present

## 2016-01-26 DIAGNOSIS — I120 Hypertensive chronic kidney disease with stage 5 chronic kidney disease or end stage renal disease: Secondary | ICD-10-CM | POA: Diagnosis not present

## 2016-01-26 DIAGNOSIS — Z7982 Long term (current) use of aspirin: Secondary | ICD-10-CM | POA: Diagnosis not present

## 2016-01-26 DIAGNOSIS — R600 Localized edema: Secondary | ICD-10-CM | POA: Diagnosis not present

## 2016-01-26 DIAGNOSIS — N186 End stage renal disease: Secondary | ICD-10-CM | POA: Diagnosis not present

## 2016-01-26 DIAGNOSIS — Z79899 Other long term (current) drug therapy: Secondary | ICD-10-CM | POA: Diagnosis not present

## 2016-01-26 DIAGNOSIS — D8989 Other specified disorders involving the immune mechanism, not elsewhere classified: Secondary | ICD-10-CM | POA: Diagnosis not present

## 2016-01-26 DIAGNOSIS — I4891 Unspecified atrial fibrillation: Secondary | ICD-10-CM | POA: Diagnosis not present

## 2016-01-26 DIAGNOSIS — B259 Cytomegaloviral disease, unspecified: Secondary | ICD-10-CM | POA: Diagnosis not present

## 2016-01-26 DIAGNOSIS — I4892 Unspecified atrial flutter: Secondary | ICD-10-CM | POA: Diagnosis not present

## 2016-01-26 DIAGNOSIS — Z9889 Other specified postprocedural states: Secondary | ICD-10-CM | POA: Diagnosis not present

## 2016-01-26 DIAGNOSIS — Z940 Kidney transplant status: Secondary | ICD-10-CM | POA: Diagnosis not present

## 2016-01-27 DIAGNOSIS — M5416 Radiculopathy, lumbar region: Secondary | ICD-10-CM | POA: Diagnosis not present

## 2016-01-27 DIAGNOSIS — M545 Low back pain: Secondary | ICD-10-CM | POA: Diagnosis not present

## 2016-01-31 DIAGNOSIS — M545 Low back pain: Secondary | ICD-10-CM | POA: Diagnosis not present

## 2016-01-31 DIAGNOSIS — M5416 Radiculopathy, lumbar region: Secondary | ICD-10-CM | POA: Diagnosis not present

## 2016-02-23 DIAGNOSIS — B259 Cytomegaloviral disease, unspecified: Secondary | ICD-10-CM | POA: Diagnosis not present

## 2016-02-23 DIAGNOSIS — Z4822 Encounter for aftercare following kidney transplant: Secondary | ICD-10-CM | POA: Diagnosis not present

## 2016-02-23 DIAGNOSIS — Z940 Kidney transplant status: Secondary | ICD-10-CM | POA: Diagnosis not present

## 2016-02-23 DIAGNOSIS — B258 Other cytomegaloviral diseases: Secondary | ICD-10-CM | POA: Diagnosis not present

## 2016-02-23 DIAGNOSIS — E785 Hyperlipidemia, unspecified: Secondary | ICD-10-CM | POA: Diagnosis not present

## 2016-02-23 DIAGNOSIS — Z992 Dependence on renal dialysis: Secondary | ICD-10-CM | POA: Diagnosis not present

## 2016-02-23 DIAGNOSIS — E213 Hyperparathyroidism, unspecified: Secondary | ICD-10-CM | POA: Diagnosis not present

## 2016-02-23 DIAGNOSIS — Z79899 Other long term (current) drug therapy: Secondary | ICD-10-CM | POA: Diagnosis not present

## 2016-02-23 DIAGNOSIS — I482 Chronic atrial fibrillation: Secondary | ICD-10-CM | POA: Diagnosis not present

## 2016-02-23 DIAGNOSIS — I120 Hypertensive chronic kidney disease with stage 5 chronic kidney disease or end stage renal disease: Secondary | ICD-10-CM | POA: Diagnosis not present

## 2016-02-23 DIAGNOSIS — I4891 Unspecified atrial fibrillation: Secondary | ICD-10-CM | POA: Diagnosis not present

## 2016-02-23 DIAGNOSIS — N186 End stage renal disease: Secondary | ICD-10-CM | POA: Diagnosis not present

## 2016-02-23 DIAGNOSIS — I10 Essential (primary) hypertension: Secondary | ICD-10-CM | POA: Diagnosis not present

## 2016-02-23 DIAGNOSIS — D899 Disorder involving the immune mechanism, unspecified: Secondary | ICD-10-CM | POA: Diagnosis not present

## 2016-03-07 DIAGNOSIS — Z940 Kidney transplant status: Secondary | ICD-10-CM | POA: Diagnosis not present

## 2016-03-13 DIAGNOSIS — B360 Pityriasis versicolor: Secondary | ICD-10-CM | POA: Diagnosis not present

## 2016-03-13 DIAGNOSIS — L259 Unspecified contact dermatitis, unspecified cause: Secondary | ICD-10-CM | POA: Diagnosis not present

## 2016-03-22 DIAGNOSIS — R000 Tachycardia, unspecified: Secondary | ICD-10-CM | POA: Diagnosis not present

## 2016-03-22 DIAGNOSIS — Z8673 Personal history of transient ischemic attack (TIA), and cerebral infarction without residual deficits: Secondary | ICD-10-CM | POA: Diagnosis not present

## 2016-03-22 DIAGNOSIS — I10 Essential (primary) hypertension: Secondary | ICD-10-CM | POA: Diagnosis not present

## 2016-03-22 DIAGNOSIS — Z940 Kidney transplant status: Secondary | ICD-10-CM | POA: Diagnosis not present

## 2016-03-22 DIAGNOSIS — D8989 Other specified disorders involving the immune mechanism, not elsewhere classified: Secondary | ICD-10-CM | POA: Diagnosis not present

## 2016-03-22 DIAGNOSIS — Z4822 Encounter for aftercare following kidney transplant: Secondary | ICD-10-CM | POA: Diagnosis not present

## 2016-03-22 DIAGNOSIS — Z79899 Other long term (current) drug therapy: Secondary | ICD-10-CM | POA: Diagnosis not present

## 2016-03-29 DIAGNOSIS — Z940 Kidney transplant status: Secondary | ICD-10-CM | POA: Diagnosis not present

## 2016-04-03 DIAGNOSIS — B360 Pityriasis versicolor: Secondary | ICD-10-CM | POA: Diagnosis not present

## 2016-04-03 DIAGNOSIS — L309 Dermatitis, unspecified: Secondary | ICD-10-CM | POA: Diagnosis not present

## 2016-04-04 DIAGNOSIS — Z940 Kidney transplant status: Secondary | ICD-10-CM | POA: Diagnosis not present

## 2016-04-18 DIAGNOSIS — D6832 Hemorrhagic disorder due to extrinsic circulating anticoagulants: Secondary | ICD-10-CM | POA: Diagnosis not present

## 2016-04-18 DIAGNOSIS — Z888 Allergy status to other drugs, medicaments and biological substances status: Secondary | ICD-10-CM | POA: Diagnosis not present

## 2016-04-18 DIAGNOSIS — Z7982 Long term (current) use of aspirin: Secondary | ICD-10-CM | POA: Diagnosis not present

## 2016-04-18 DIAGNOSIS — I10 Essential (primary) hypertension: Secondary | ICD-10-CM | POA: Diagnosis not present

## 2016-04-18 DIAGNOSIS — B259 Cytomegaloviral disease, unspecified: Secondary | ICD-10-CM | POA: Diagnosis not present

## 2016-04-18 DIAGNOSIS — R609 Edema, unspecified: Secondary | ICD-10-CM | POA: Diagnosis not present

## 2016-04-18 DIAGNOSIS — I4892 Unspecified atrial flutter: Secondary | ICD-10-CM | POA: Diagnosis not present

## 2016-04-18 DIAGNOSIS — T45515A Adverse effect of anticoagulants, initial encounter: Secondary | ICD-10-CM | POA: Diagnosis not present

## 2016-04-18 DIAGNOSIS — I120 Hypertensive chronic kidney disease with stage 5 chronic kidney disease or end stage renal disease: Secondary | ICD-10-CM | POA: Diagnosis not present

## 2016-04-18 DIAGNOSIS — Z79899 Other long term (current) drug therapy: Secondary | ICD-10-CM | POA: Diagnosis not present

## 2016-04-18 DIAGNOSIS — Z4822 Encounter for aftercare following kidney transplant: Secondary | ICD-10-CM | POA: Diagnosis not present

## 2016-04-18 DIAGNOSIS — D899 Disorder involving the immune mechanism, unspecified: Secondary | ICD-10-CM | POA: Diagnosis not present

## 2016-04-18 DIAGNOSIS — Z23 Encounter for immunization: Secondary | ICD-10-CM | POA: Diagnosis not present

## 2016-04-18 DIAGNOSIS — Z9884 Bariatric surgery status: Secondary | ICD-10-CM | POA: Diagnosis not present

## 2016-04-18 DIAGNOSIS — I4891 Unspecified atrial fibrillation: Secondary | ICD-10-CM | POA: Diagnosis not present

## 2016-04-18 DIAGNOSIS — N186 End stage renal disease: Secondary | ICD-10-CM | POA: Diagnosis not present

## 2016-04-18 DIAGNOSIS — Z86718 Personal history of other venous thrombosis and embolism: Secondary | ICD-10-CM | POA: Diagnosis not present

## 2016-04-18 DIAGNOSIS — R600 Localized edema: Secondary | ICD-10-CM | POA: Diagnosis not present

## 2016-04-18 DIAGNOSIS — Z940 Kidney transplant status: Secondary | ICD-10-CM | POA: Diagnosis not present

## 2016-05-08 ENCOUNTER — Ambulatory Visit (INDEPENDENT_AMBULATORY_CARE_PROVIDER_SITE_OTHER): Payer: Medicare Other | Admitting: Family Medicine

## 2016-05-08 VITALS — BP 168/81 | HR 73 | Temp 97.8°F | Wt 223.6 lb

## 2016-05-08 DIAGNOSIS — J4521 Mild intermittent asthma with (acute) exacerbation: Secondary | ICD-10-CM | POA: Diagnosis not present

## 2016-05-08 DIAGNOSIS — R062 Wheezing: Secondary | ICD-10-CM

## 2016-05-08 DIAGNOSIS — Z940 Kidney transplant status: Secondary | ICD-10-CM | POA: Diagnosis not present

## 2016-05-08 MED ORDER — AZITHROMYCIN 250 MG PO TABS: ORAL_TABLET | ORAL | 0 refills | Status: DC

## 2016-05-08 MED ORDER — ALBUTEROL SULFATE HFA 108 (90 BASE) MCG/ACT IN AERS: 2.0000 | INHALATION_SPRAY | Freq: Four times a day (QID) | RESPIRATORY_TRACT | 0 refills | Status: DC | PRN

## 2016-05-08 MED ORDER — BECLOMETHASONE DIPROPIONATE 40 MCG/ACT IN AERS: 2.0000 | INHALATION_SPRAY | Freq: Two times a day (BID) | RESPIRATORY_TRACT | 12 refills | Status: DC

## 2016-05-08 MED ORDER — ALBUTEROL SULFATE (2.5 MG/3ML) 0.083% IN NEBU
2.5000 mg | INHALATION_SOLUTION | Freq: Once | RESPIRATORY_TRACT | Status: AC
  Administered 2016-05-08: 2.5 mg via RESPIRATORY_TRACT

## 2016-05-08 NOTE — Patient Instructions (Signed)

## 2016-05-08 NOTE — Progress Notes (Signed)
Pre visit review using our clinic review tool, if applicable. No additional management support is needed unless otherwise documented below in the visit note. 

## 2016-05-09 ENCOUNTER — Encounter: Payer: Self-pay | Admitting: Family Medicine

## 2016-05-09 NOTE — Assessment & Plan Note (Signed)
Per [facility]

## 2016-05-09 NOTE — Progress Notes (Signed)
Patient ID: [name], female    DOB: 1945-11-07  Age: 70 y.o. MRN: [id number]    Subjective:  Subjective  HPI [name] presents for cough x 3 weeks.  + productive No fevers, chills Pt sees nephrology -- s/p kidney transplant--doing well    Review of Systems  Constitutional: Negative for appetite change, chills, diaphoresis, fatigue, fever and unexpected weight change.  HENT: Positive for congestion, postnasal drip and rhinorrhea. Negative for sinus pressure.   Eyes: Negative for pain, redness and visual disturbance.  Respiratory: Positive for cough, shortness of breath and wheezing. Negative for chest tightness.   Cardiovascular: Negative for chest pain, palpitations and leg swelling.  Endocrine: Negative for cold intolerance, heat intolerance, polydipsia, polyphagia and polyuria.  Genitourinary: Negative for difficulty urinating, dysuria and frequency.  Allergic/Immunologic: Negative for environmental allergies.  Neurological: Negative for dizziness, light-headedness, numbness and headaches.    History Past Medical History:  Diagnosis Date  . Anemia   . Arthritis    HIP  . Asthma    as a child  . Blood transfusion    never had a reaction to blood transfusion  . Cardiomyopathy Mar 2013   Mild, EF 50-55% by Mar 2013 ECHO, diast dysfxn II  . Constipation    takes Miralax daily  . Depression    takes Zoloft daily  . DVT of lower extremity, bilateral ([place]) 12/21/11   "they're there now; been there for 2 wks"  . Eczema   . ESRD (end stage renal disease) on dialysis [facility]) 09/2011   Started dialysis Mar 2013 on M/W/F on [place]  . Fractures, stress    in both feet--6 OR 7 YRS AGO--RESOLVED  . Gout    doesn't require meds   . HCAP (healthcare-associated pneumonia)    09/10/11  . Hypercalcemia    09/10/11  . Hypertension    takes Diltiazem daily   . Morbid obesity ([place])   . Nonischemic dilated cardiomyopathy ([place])   . Oligouria   . PAF (paroxysmal atrial  fibrillation) (HCC)     HX OF CEREBRAL BLEED WHILE ON COUMADIN-SO PT NOT ON ANY BLOOD THINNERS NOW  . Peripheral vascular disease ([place])   . Sleep apnea    HAS CPAP-sleep study done 2007--PT NOT USING AT PRESENT  . Stroke [facility]) 2009   denies residual, hemorrhagic now off coumadin  . Stroke due to intracerebral hemorrhage [facility]) 2009    She has a past surgical history that includes Cardiac valuve replacement (1960); I&D extremity (09/15/2011); AV fistula placement (09/28/2011); Cystoscopy; Colonoscopy; Irrigation and debridement abscess (12/21/11); Vena cava filter placement (11/2011); Total hip arthroplasty (02/2011); Femur fracture surgery (03/2011; 09/03/2011); Insertion of dialysis catheter; Cholecystectomy; Incision and drainage hip (12/21/2011); Total hip revision (03/25/2012); and Kidney transplant (07/19/15).   Her family history includes Cancer in her father; Diabetes in her mother; Hypertension in her mother.She reports that she has never smoked. She has never used smokeless tobacco. She reports that she does not drink alcohol or use drugs.  Current Outpatient Prescriptions on File Prior to Visit  Medication Sig Dispense Refill  . acetaminophen (TYLENOL) 500 MG tablet Take 1,000 mg by mouth every 6 (six) hours as needed (For pain.).     Marland Kitchen aspirin EC 81 MG tablet Take 81 mg by mouth.    . diltiazem (CARDIZEM CD) 240 MG 24 hr capsule Take 1 capsule (240 mg total) by mouth daily after breakfast. 90 capsule 0  . docusate sodium (COLACE) 100 MG capsule Take  100 mg by mouth daily as needed (For constipation.).     Marland Kitchen metoprolol tartrate (LOPRESSOR) 25 MG tablet Take 1.5 tablets (37.5 mg total) by mouth 2 (two) times daily.    . mycophenolate (MYFORTIC) 180 MG EC tablet Take 360 mg by mouth 2 (two) times daily.     Marland Kitchen omeprazole (PRILOSEC) 20 MG capsule Take 20 mg by mouth daily.     . sertraline (ZOLOFT) 50 MG tablet 1 tab by mouth daily (Patient taking differently: Take 50 mg by mouth daily. ) 90 tablet 3    . Specialty Vitamins Products (MAGNESIUM, AMINO ACID CHELATE,) 133 MG tablet Take 3 tablets by mouth daily.  5  . tacrolimus (PROGRAF) 0.5 MG capsule Take 0.5 mg by mouth every evening.  3  . tacrolimus (PROGRAF) 1 MG capsule Take 1 mg by mouth daily.     . valGANciclovir (VALCYTE) 450 MG tablet Take 900 mg by mouth 2 (two) times daily.    . [DISCONTINUED] colchicine 0.6 MG tablet Take 1 tablet (0.6 mg total) by mouth daily. 31 tablet 0  . [DISCONTINUED] furosemide (LASIX) 80 MG tablet Take 80-160 mg by mouth 2 (two) times daily. 2 tab in am and 1 tab    . [DISCONTINUED] gabapentin (NEURONTIN) 300 MG capsule Take 300 mg by mouth 3 (three) times daily.     . [DISCONTINUED] metoprolol (TOPROL XL) 100 MG 24 hr tablet Take 100 mg by mouth daily before breakfast.     . [DISCONTINUED] potassium chloride SA (KLOR-CON M20) 20 MEQ tablet Take 20 mEq by mouth daily.     . [DISCONTINUED] rivaroxaban (XARELTO) 10 MG TABS tablet Take 1 tablet (10 mg total) by mouth daily. 14 tablet 0   No current facility-administered medications on file prior to visit.      Objective:  Objective  Physical Exam  Constitutional: She is oriented to person, place, and time. She appears well-developed and well-nourished.  HENT:  Right Ear: External ear normal.  Left Ear: External ear normal.  + PND + errythema  Eyes: Conjunctivae are normal. Right eye exhibits no discharge. Left eye exhibits no discharge.  Cardiovascular: Normal rate, regular rhythm and normal heart sounds.   No murmur heard. Pulmonary/Chest: Effort normal. No respiratory distress. She has wheezes. She has no rales.     She exhibits no tenderness.  Musculoskeletal: She exhibits no edema.  Lymphadenopathy:    She has cervical adenopathy.  Neurological: She is alert and oriented to person, place, and time.  Psychiatric: She has a normal mood and affect. Her behavior is normal. Judgment and thought content normal.  Nursing note and vitals  reviewed.  BP (!) 168/81 (BP Location: Right Arm, Patient Position: Sitting, Cuff Size: Large)   Pulse 73   Temp 97.8 F (36.6 C) (Oral)   Wt 223 lb 9.6 oz (101.4 kg)   SpO2 100%   BMI 36.09 kg/m  Wt Readings from Last 3 Encounters:  05/08/16 223 lb 9.6 oz (101.4 kg)  09/13/15 228 lb 6.4 oz (103.6 kg)  09/07/14 241 lb 3.2 oz (109.4 kg)     Lab Results  Component Value Date   WBC 8.1 03/31/2012   HGB 8.3 (L) 03/31/2012   HCT 26.4 (L) 03/31/2012   PLT 235 03/31/2012   GLUCOSE 96 03/31/2012   CHOL  11/16/2008    184        ATP III CLASSIFICATION:  <200     mg/dL   Desirable  200-239  mg/dL  Borderline High  >=240    mg/dL   High          TRIG 157 (H) 09/15/2011   HDL 33 (L) 11/16/2008   LDLCALC (H) 11/16/2008    130        Total Cholesterol/HDL:CHD Risk Coronary Heart Disease Risk Table                     Men   Women  1/2 Average Risk   3.4   3.3  Average Risk       5.0   4.4  2 X Average Risk   9.6   7.1  3 X Average Risk  23.4   11.0        Use the calculated Patient Ratio above and the CHD Risk Table to determine the patient's CHD Risk.        ATP III CLASSIFICATION (LDL):  <100     mg/dL   Optimal  100-129  mg/dL   Near or Above                    Optimal  130-159  mg/dL   Borderline  160-189  mg/dL   High  >190     mg/dL   Very High   ALT 15 03/26/2012   AST 21 03/26/2012   NA 137 03/31/2012   K 3.9 03/31/2012   CL 102 03/31/2012   CREATININE 2.94 (H) 03/31/2012   BUN 38 (H) 03/31/2012   CO2 28 03/31/2012   TSH 2.655 09/11/2011   INR 0.98 03/25/2012   HGBA1C  11/16/2008    5.5 (NOTE) The ADA recommends the following therapeutic goal for glycemic control related to Hgb A1c measurement: Goal of therapy: <6.5 Hgb A1c  Reference: American Diabetes Association: Clinical Practice Recommendations 2010, Diabetes Care, 2010, 33: (Suppl  1).    US Breast Ltd Uni Right Inc Axilla  Result Date: 08/03/2013 CLINICAL DATA:  Patient presents for a  diagnostic right breast ultrasound as followup to a recent screening mammogram with tomography demonstrating a small right breast mass. EXAM: ULTRASOUND OF THE right BREAST COMPARISON:  Previous exams. FINDINGS: Ultrasound is performed, showing a well-defined ovoid cyst at the 3 o'clock position of the right breast 2 cm from the nipple measuring 3 x 5 x 5 mm. IMPRESSION: 3 x 5 x 5 mm cyst at the 3 o'clock position of the right breast 2 cm from the nipple. RECOMMENDATION: Recommend continued annual bilateral screening mammographic followup. I have discussed the findings and recommendations with the patient. Results were also provided in writing at the conclusion of the visit. If applicable, a reminder letter will be sent to the patient regarding the next appointment. BI-RADS CATEGORY  2: Benign Finding(s) Electronically Signed   By: Marin Olp M.D.   On: 08/03/2013 13:49     Assessment & Plan:  Plan  I have discontinued Ms. Fuhr's predniSONE, amoxicillin, and sulfamethoxazole-trimethoprim. I am also having her start on albuterol, beclomethasone, and azithromycin. Additionally, I am having her maintain her docusate sodium, acetaminophen, aspirin EC, mycophenolate, omeprazole, tacrolimus, sertraline, metoprolol tartrate, diltiazem, valGANciclovir, magnesium (amino acid chelate), and tacrolimus. We administered albuterol.  Meds ordered this encounter  Medications  . albuterol (PROVENTIL) (2.5 MG/3ML) 0.083% nebulizer solution 2.5 mg  . albuterol (PROVENTIL HFA;VENTOLIN HFA) 108 (90 Base) MCG/ACT inhaler    Sig: Inhale 2 puffs into the lungs every 6 (six) hours as needed for wheezing or shortness of breath.  Dispense:  1 Inhaler    Refill:  0  . beclomethasone (QVAR) 40 MCG/ACT inhaler    Sig: Inhale 2 puffs into the lungs 2 (two) times daily.    Dispense:  1 Inhaler    Refill:  12  . azithromycin (ZITHROMAX Z-PAK) 250 MG tablet    Sig: As directed    Dispense:  6 each    Refill:  0     Problem List Items Addressed This Visit      Unprioritized   Deceased-donor kidney transplant recipient    Per [facility]       Other Visit Diagnoses    Mild intermittent asthmatic bronchitis with acute exacerbation    -  Primary   Relevant Medications   albuterol (PROVENTIL) (2.5 MG/3ML) 0.083% nebulizer solution 2.5 mg (Completed)   albuterol (PROVENTIL HFA;VENTOLIN HFA) 108 (90 Base) MCG/ACT inhaler   beclomethasone (QVAR) 40 MCG/ACT inhaler   azithromycin (ZITHROMAX Z-PAK) 250 MG tablet   Wheezing       Relevant Medications   albuterol (PROVENTIL) (2.5 MG/3ML) 0.083% nebulizer solution 2.5 mg (Completed)      Follow-up: No Follow-up on file.  [name], DO

## 2016-05-17 DIAGNOSIS — B259 Cytomegaloviral disease, unspecified: Secondary | ICD-10-CM | POA: Diagnosis not present

## 2016-05-17 DIAGNOSIS — Z86718 Personal history of other venous thrombosis and embolism: Secondary | ICD-10-CM | POA: Diagnosis not present

## 2016-05-17 DIAGNOSIS — Z4822 Encounter for aftercare following kidney transplant: Secondary | ICD-10-CM | POA: Diagnosis not present

## 2016-05-17 DIAGNOSIS — E785 Hyperlipidemia, unspecified: Secondary | ICD-10-CM | POA: Diagnosis not present

## 2016-05-17 DIAGNOSIS — R0602 Shortness of breath: Secondary | ICD-10-CM | POA: Diagnosis not present

## 2016-05-17 DIAGNOSIS — I4891 Unspecified atrial fibrillation: Secondary | ICD-10-CM | POA: Diagnosis not present

## 2016-05-17 DIAGNOSIS — D899 Disorder involving the immune mechanism, unspecified: Secondary | ICD-10-CM | POA: Diagnosis not present

## 2016-05-17 DIAGNOSIS — Z940 Kidney transplant status: Secondary | ICD-10-CM | POA: Diagnosis not present

## 2016-05-17 DIAGNOSIS — E669 Obesity, unspecified: Secondary | ICD-10-CM | POA: Diagnosis not present

## 2016-05-17 DIAGNOSIS — M7989 Other specified soft tissue disorders: Secondary | ICD-10-CM | POA: Diagnosis not present

## 2016-05-17 DIAGNOSIS — B258 Other cytomegaloviral diseases: Secondary | ICD-10-CM | POA: Diagnosis not present

## 2016-05-17 DIAGNOSIS — R600 Localized edema: Secondary | ICD-10-CM | POA: Diagnosis not present

## 2016-05-25 DIAGNOSIS — I517 Cardiomegaly: Secondary | ICD-10-CM | POA: Diagnosis not present

## 2016-05-25 DIAGNOSIS — I071 Rheumatic tricuspid insufficiency: Secondary | ICD-10-CM | POA: Diagnosis not present

## 2016-05-25 DIAGNOSIS — I361 Nonrheumatic tricuspid (valve) insufficiency: Secondary | ICD-10-CM | POA: Diagnosis not present

## 2016-05-25 DIAGNOSIS — I272 Pulmonary hypertension, unspecified: Secondary | ICD-10-CM | POA: Diagnosis not present

## 2016-05-25 DIAGNOSIS — I358 Other nonrheumatic aortic valve disorders: Secondary | ICD-10-CM | POA: Diagnosis not present

## 2016-05-25 DIAGNOSIS — I270 Primary pulmonary hypertension: Secondary | ICD-10-CM | POA: Diagnosis not present

## 2016-06-12 DIAGNOSIS — Z7982 Long term (current) use of aspirin: Secondary | ICD-10-CM | POA: Diagnosis not present

## 2016-06-12 DIAGNOSIS — Z940 Kidney transplant status: Secondary | ICD-10-CM | POA: Diagnosis not present

## 2016-06-12 DIAGNOSIS — D8989 Other specified disorders involving the immune mechanism, not elsewhere classified: Secondary | ICD-10-CM | POA: Diagnosis not present

## 2016-06-12 DIAGNOSIS — N186 End stage renal disease: Secondary | ICD-10-CM | POA: Diagnosis not present

## 2016-06-12 DIAGNOSIS — D649 Anemia, unspecified: Secondary | ICD-10-CM | POA: Diagnosis not present

## 2016-06-12 DIAGNOSIS — Z6839 Body mass index (BMI) 39.0-39.9, adult: Secondary | ICD-10-CM | POA: Diagnosis not present

## 2016-06-12 DIAGNOSIS — Z79899 Other long term (current) drug therapy: Secondary | ICD-10-CM | POA: Diagnosis not present

## 2016-06-12 DIAGNOSIS — Z8673 Personal history of transient ischemic attack (TIA), and cerebral infarction without residual deficits: Secondary | ICD-10-CM | POA: Diagnosis not present

## 2016-06-12 DIAGNOSIS — Z4822 Encounter for aftercare following kidney transplant: Secondary | ICD-10-CM | POA: Diagnosis not present

## 2016-06-12 DIAGNOSIS — J4520 Mild intermittent asthma, uncomplicated: Secondary | ICD-10-CM | POA: Diagnosis not present

## 2016-06-12 DIAGNOSIS — Z86718 Personal history of other venous thrombosis and embolism: Secondary | ICD-10-CM | POA: Diagnosis not present

## 2016-06-12 DIAGNOSIS — E785 Hyperlipidemia, unspecified: Secondary | ICD-10-CM | POA: Diagnosis not present

## 2016-06-12 DIAGNOSIS — I4892 Unspecified atrial flutter: Secondary | ICD-10-CM | POA: Diagnosis not present

## 2016-06-12 DIAGNOSIS — I120 Hypertensive chronic kidney disease with stage 5 chronic kidney disease or end stage renal disease: Secondary | ICD-10-CM | POA: Diagnosis not present

## 2016-06-12 DIAGNOSIS — E669 Obesity, unspecified: Secondary | ICD-10-CM | POA: Diagnosis not present

## 2016-06-12 DIAGNOSIS — J45909 Unspecified asthma, uncomplicated: Secondary | ICD-10-CM | POA: Diagnosis not present

## 2016-06-12 DIAGNOSIS — E213 Hyperparathyroidism, unspecified: Secondary | ICD-10-CM | POA: Diagnosis not present

## 2016-06-12 DIAGNOSIS — Z992 Dependence on renal dialysis: Secondary | ICD-10-CM | POA: Diagnosis not present

## 2016-06-26 DIAGNOSIS — Z940 Kidney transplant status: Secondary | ICD-10-CM | POA: Diagnosis not present

## 2016-07-17 ENCOUNTER — Other Ambulatory Visit: Payer: Self-pay | Admitting: Family Medicine

## 2016-07-17 DIAGNOSIS — Z940 Kidney transplant status: Secondary | ICD-10-CM | POA: Diagnosis not present

## 2016-07-17 DIAGNOSIS — J45909 Unspecified asthma, uncomplicated: Secondary | ICD-10-CM | POA: Diagnosis not present

## 2016-07-17 DIAGNOSIS — Z131 Encounter for screening for diabetes mellitus: Secondary | ICD-10-CM | POA: Diagnosis not present

## 2016-07-17 DIAGNOSIS — B258 Other cytomegaloviral diseases: Secondary | ICD-10-CM | POA: Diagnosis not present

## 2016-07-17 DIAGNOSIS — J4521 Mild intermittent asthma with (acute) exacerbation: Secondary | ICD-10-CM

## 2016-07-17 DIAGNOSIS — D8989 Other specified disorders involving the immune mechanism, not elsewhere classified: Secondary | ICD-10-CM | POA: Diagnosis not present

## 2016-07-17 DIAGNOSIS — I10 Essential (primary) hypertension: Secondary | ICD-10-CM | POA: Diagnosis not present

## 2016-07-17 DIAGNOSIS — Z8673 Personal history of transient ischemic attack (TIA), and cerebral infarction without residual deficits: Secondary | ICD-10-CM | POA: Diagnosis not present

## 2016-07-17 DIAGNOSIS — E785 Hyperlipidemia, unspecified: Secondary | ICD-10-CM | POA: Diagnosis not present

## 2016-07-17 DIAGNOSIS — E782 Mixed hyperlipidemia: Secondary | ICD-10-CM | POA: Diagnosis not present

## 2016-07-17 DIAGNOSIS — Z4822 Encounter for aftercare following kidney transplant: Secondary | ICD-10-CM | POA: Diagnosis not present

## 2016-07-17 DIAGNOSIS — Z86718 Personal history of other venous thrombosis and embolism: Secondary | ICD-10-CM | POA: Diagnosis not present

## 2016-07-17 DIAGNOSIS — Z79899 Other long term (current) drug therapy: Secondary | ICD-10-CM | POA: Diagnosis not present

## 2016-07-17 DIAGNOSIS — Z792 Long term (current) use of antibiotics: Secondary | ICD-10-CM | POA: Diagnosis not present

## 2016-07-17 DIAGNOSIS — I4891 Unspecified atrial fibrillation: Secondary | ICD-10-CM | POA: Diagnosis not present

## 2016-08-07 DIAGNOSIS — Z79899 Other long term (current) drug therapy: Secondary | ICD-10-CM | POA: Diagnosis not present

## 2016-08-07 DIAGNOSIS — I4891 Unspecified atrial fibrillation: Secondary | ICD-10-CM | POA: Diagnosis not present

## 2016-08-07 DIAGNOSIS — Z792 Long term (current) use of antibiotics: Secondary | ICD-10-CM | POA: Diagnosis not present

## 2016-08-07 DIAGNOSIS — R809 Proteinuria, unspecified: Secondary | ICD-10-CM | POA: Diagnosis not present

## 2016-08-07 DIAGNOSIS — D8989 Other specified disorders involving the immune mechanism, not elsewhere classified: Secondary | ICD-10-CM | POA: Diagnosis not present

## 2016-08-07 DIAGNOSIS — L409 Psoriasis, unspecified: Secondary | ICD-10-CM

## 2016-08-07 DIAGNOSIS — Z4822 Encounter for aftercare following kidney transplant: Secondary | ICD-10-CM | POA: Diagnosis not present

## 2016-08-07 DIAGNOSIS — Z86718 Personal history of other venous thrombosis and embolism: Secondary | ICD-10-CM | POA: Diagnosis not present

## 2016-08-07 DIAGNOSIS — E785 Hyperlipidemia, unspecified: Secondary | ICD-10-CM | POA: Diagnosis not present

## 2016-08-07 DIAGNOSIS — I10 Essential (primary) hypertension: Secondary | ICD-10-CM | POA: Diagnosis not present

## 2016-08-07 DIAGNOSIS — Z940 Kidney transplant status: Secondary | ICD-10-CM | POA: Diagnosis not present

## 2016-08-07 HISTORY — DX: Psoriasis, unspecified: L40.9

## 2016-08-27 NOTE — Progress Notes (Deleted)
Subjective:   [name] is a 71 y.o. female who presents for an Initial Medicare Annual Wellness Visit.  Review of Systems    No ROS.  Medicare Wellness Visit.    The Patient was informed that the wellness visit is to identify future health risk and educate and initiate measures that can reduce risk for increased disease through the lifespan.   Describes health as fair, good or great?  Sleep patterns: {SX; SLEEP PATTERNS:18802::"feels rested on waking","does not get up to void","gets up *** times nightly to void","sleeps *** hours nightly"}.   Home Safety/Smoke Alarms:   Living environment; residence and Firearm Safety: {Rehab home environment / accessibility:30080::"no firearms","firearms stored safely"}. Seat Belt Safety/Bike Helmet: Wears seat belt.   Counseling:   Eye Exam-  Dental-  Female:   Pap- Last 01/14/15 with Dr.Sherry Dickstein-normal (per pt)      Mammo- Last 08/03/13: BI-RADS CATEGORY 2: Benign Finding(s)       Dexa scan-       CCS- Last 07/30/07-normal w/ Dr.Mann (per external report)     Objective:    There were no vitals filed for this visit. There is no height or weight on file to calculate BMI.   Current Medications (verified) Outpatient Encounter Prescriptions as of 08/28/2016  Medication Sig  . acetaminophen (TYLENOL) 500 MG tablet Take 1,000 mg by mouth every 6 (six) hours as needed (For pain.).   Marland Kitchen aspirin EC 81 MG tablet Take 81 mg by mouth.  Marland Kitchen azithromycin (ZITHROMAX Z-PAK) 250 MG tablet As directed  . beclomethasone (QVAR) 40 MCG/ACT inhaler Inhale 2 puffs into the lungs 2 (two) times daily.  Marland Kitchen diltiazem (CARDIZEM CD) 240 MG 24 hr capsule Take 1 capsule (240 mg total) by mouth daily after breakfast.  . docusate sodium (COLACE) 100 MG capsule Take 100 mg by mouth daily as needed (For constipation.).   Marland Kitchen metoprolol tartrate (LOPRESSOR) 25 MG tablet Take 1.5 tablets (37.5 mg total) by mouth 2 (two) times daily.  Marland Kitchen omeprazole (PRILOSEC) 20 MG  capsule Take 20 mg by mouth daily.   . sertraline (ZOLOFT) 50 MG tablet 1 tab by mouth daily (Patient taking differently: Take 50 mg by mouth daily. )  . Specialty Vitamins Products (MAGNESIUM, AMINO ACID CHELATE,) 133 MG tablet Take 3 tablets by mouth daily.  . tacrolimus (PROGRAF) 0.5 MG capsule Take 0.5 mg by mouth every evening.  . valGANciclovir (VALCYTE) 450 MG tablet Take 900 mg by mouth 2 (two) times daily.  . VENTOLIN HFA 108 (90 Base) MCG/ACT inhaler INHALE 2 PUFFS INTO THE LUNGS EVERY 6 (SIX) HOURS AS NEEDED FOR WHEEZING OR SHORTNESS OF BREATH.   No facility-administered encounter medications on file as of 08/28/2016.     Allergies (verified) Ace inhibitors and Warfarin sodium   History: Past Medical History:  Diagnosis Date  . Anemia   . Arthritis    HIP  . Asthma    as a child  . Blood transfusion    never had a reaction to blood transfusion  . Cardiomyopathy Mar 2013   Mild, EF 50-55% by Mar 2013 ECHO, diast dysfxn II  . Constipation    takes Miralax daily  . Depression    takes Zoloft daily  . DVT of lower extremity, bilateral ([place]) 12/21/11   "they're there now; been there for 2 wks"  . Eczema   . ESRD (end stage renal disease) on dialysis [facility]) 09/2011   Started dialysis Mar 2013 on M/W/F on [place]  .  Fractures, stress    in both feet--6 OR 7 YRS AGO--RESOLVED  . Gout    doesn't require meds   . HCAP (healthcare-associated pneumonia)    09/10/11  . Hypercalcemia    09/10/11  . Hypertension    takes Diltiazem daily   . Morbid obesity ([place])   . Nonischemic dilated cardiomyopathy ([place])   . Oligouria   . PAF (paroxysmal atrial fibrillation) (HCC)     HX OF CEREBRAL BLEED WHILE ON COUMADIN-SO PT NOT ON ANY BLOOD THINNERS NOW  . Peripheral vascular disease ([place])   . Sleep apnea    HAS CPAP-sleep study done 2007--PT NOT USING AT PRESENT  . Stroke [place]) 2009   denies residual, hemorrhagic now off coumadin  . 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 AV FISTULA PLACEMENT  09/28/2011   Procedure: ARTERIOVENOUS (AV) FISTULA CREATION;  Surgeon: Rosetta Posner, MD;  Location: [place];  Service: Vascular;  Laterality: Left;  . CARDIAC VALVE SURGERY  1960    FOR ATRIAL SEPTAL DEFECT  . CHOLECYSTECTOMY     1980's  . COLONOSCOPY    . CYSTOSCOPY     many yrs ago  . FEMUR FRACTURE SURGERY  03/2011; 09/03/2011   right; "had 2, 2 wk apart in 2012; broke it again 08/2011 & had OR"  . I&D EXTREMITY  09/15/2011   Procedure: IRRIGATION AND DEBRIDEMENT EXTREMITY w REMOVAL OF HARDWARE;  Surgeon: [name], MD;  Location: [place];  Service: Orthopedics;  Laterality: Right;  . INCISION AND DRAINAGE HIP  12/21/2011   Procedure: IRRIGATION AND DEBRIDEMENT HIP;  Surgeon: [name], MD;  Location: [place];  Service: Orthopedics;  Laterality: Right;  I&D RIGHT HIP WITH PLACEMENT ANTIBIOTIC SPACER  . INSERTION OF DIALYSIS CATHETER     Procedure: INSERTION OF DIALYSIS CATHETER;  Surgeon: Rosetta Posner, MD;  Location: [facility];  Service: Vascular;  Laterality: Right;  . IRRIGATION AND DEBRIDEMENT ABSCESS  12/21/11   right hip  . KIDNEY TRANSPLANT  07/19/15  . TOTAL HIP ARTHROPLASTY  02/2011   right THA 02/2011, I&D/removal of hardware 09/2011,, repeat I&D Jun 2013, reimplantation R THA 03-26-2012  . TOTAL HIP REVISION  03/25/2012   Procedure: TOTAL HIP REVISION;  Surgeon: [name], MD;  Location: [facility];  Service: Orthopedics;  Laterality: Right;  Right Total Hip Reimplantation  . VENA CAVA FILTER PLACEMENT  11/2011   Family History  Problem Relation Age of Onset  . Diabetes Mother   . Hypertension Mother   . Hodgkin's lymphoma  32    dscd---HODGKINS DISEASE  . Cancer Father    Social History   Occupational History  . Not on file.   Social History Main Topics  . Smoking status: Never Smoker  . Smokeless tobacco: Never Used  . Alcohol use No  . Drug use: No  . Sexual activity: No    Tobacco  Counseling Counseling given: Not Answered   Activities of Daily Living In your present state of health, do you have any difficulty performing the following activities: 09/13/2015 09/13/2015  Hearing? N Y  Vision? N N  Difficulty concentrating or making decisions? N N  Walking or climbing stairs? N Y  Dressing or bathing? N N  Doing errands, shopping? N Y  Some recent data might be hidden    Immunizations and Health Maintenance Immunization History  Administered Date(s) Administered  . Influenza Split 03/27/2012  .  Influenza Whole 04/29/2008  . Influenza,inj,Quad PF,36+ Mos 04/24/2013  . Influenza-Unspecified 04/29/2014, 03/17/2015, 04/19/2016  . Pneumococcal Conjugate-13 03/02/2014  . Pneumococcal Polysaccharide-23 04/25/2005  . Tdap 05/08/2013   Health Maintenance Due  Topic Date Due  . ZOSTAVAX  07/13/2008  . PNA vac Low Risk Adult (2 of 2 - PPSV23) 03/03/2015  . MAMMOGRAM  07/25/2015    Patient Care Team: [name], DO as PCP - General Sueanne Margarita, MD as Consulting Physician (Cardiology) Paralee Cancel, MD as Consulting Physician (Orthopedic Surgery) Fleet Contras, MD as Consulting Physician (Nephrology)  Indicate any recent Medical Services you may have received from other than [facility] providers in the past year (date may be approximate).     Assessment:   This is a routine wellness examination for [place]. Physical assessment deferred to PCP.  Hearing/Vision screen No exam data present  Dietary issues and exercise activities discussed:   Diet (meal preparation, eat out, water intake, caffeinated beverages, dairy products, fruits and vegetables): {Desc; diets:16563} Breakfast: Lunch:  Dinner:      Goals    None     Depression Screen PHQ 2/9 Scores 09/13/2015 03/02/2014  PHQ - 2 Score 0 0    Fall Risk Fall Risk  09/13/2015 03/02/2014 12/27/2011  Falls in the past year? No Yes -  Number falls in past yr: - 1 -  Injury with Fall? - No -  Risk  for fall due to : - Other (Comment) Impaired mobility  Risk for fall due to (comments): - Passed out at the scale after dialysis -    Cognitive Function:        Screening Tests Health Maintenance  Topic Date Due  . ZOSTAVAX  07/13/2008  . PNA vac Low Risk Adult (2 of 2 - PPSV23) 03/03/2015  . MAMMOGRAM  07/25/2015  . COLONOSCOPY  07/29/2017  . TETANUS/TDAP  05/09/2023  . INFLUENZA VACCINE  Completed  . DEXA SCAN  Completed  . Hepatitis C Screening  Completed      Plan:   ***  During the course of the visit, [place] was educated and counseled about the following appropriate screening and preventive services:   Vaccines to include Pneumoccal, Influenza, Hepatitis B, Td, Zostavax, HCV  Electrocardiogram  Cardiovascular disease screening  Colorectal cancer screening  Bone density screening  Diabetes screening  Glaucoma screening  Mammography/PAP  Nutrition counseling  Smoking cessation counseling  Patient Instructions (the written plan) were given to the patient.    Naaman Plummer [place], [place]   08/27/2016

## 2016-08-27 NOTE — Progress Notes (Deleted)
Pre visit review using our clinic review tool, if applicable. No additional management support is needed unless otherwise documented below in the visit note. 

## 2016-08-28 ENCOUNTER — Ambulatory Visit: Payer: Medicare Other | Admitting: *Deleted

## 2016-09-04 DIAGNOSIS — I4891 Unspecified atrial fibrillation: Secondary | ICD-10-CM | POA: Diagnosis not present

## 2016-09-04 DIAGNOSIS — Z992 Dependence on renal dialysis: Secondary | ICD-10-CM | POA: Diagnosis not present

## 2016-09-04 DIAGNOSIS — T8189XA Other complications of procedures, not elsewhere classified, initial encounter: Secondary | ICD-10-CM

## 2016-09-04 DIAGNOSIS — L989 Disorder of the skin and subcutaneous tissue, unspecified: Secondary | ICD-10-CM | POA: Diagnosis not present

## 2016-09-04 DIAGNOSIS — Z6837 Body mass index (BMI) 37.0-37.9, adult: Secondary | ICD-10-CM | POA: Diagnosis not present

## 2016-09-04 DIAGNOSIS — E669 Obesity, unspecified: Secondary | ICD-10-CM | POA: Diagnosis not present

## 2016-09-04 DIAGNOSIS — D899 Disorder involving the immune mechanism, unspecified: Secondary | ICD-10-CM | POA: Diagnosis not present

## 2016-09-04 DIAGNOSIS — R809 Proteinuria, unspecified: Secondary | ICD-10-CM

## 2016-09-04 DIAGNOSIS — G4733 Obstructive sleep apnea (adult) (pediatric): Secondary | ICD-10-CM | POA: Diagnosis not present

## 2016-09-04 DIAGNOSIS — J45909 Unspecified asthma, uncomplicated: Secondary | ICD-10-CM | POA: Diagnosis not present

## 2016-09-04 DIAGNOSIS — Z86718 Personal history of other venous thrombosis and embolism: Secondary | ICD-10-CM | POA: Diagnosis not present

## 2016-09-04 DIAGNOSIS — Z8673 Personal history of transient ischemic attack (TIA), and cerebral infarction without residual deficits: Secondary | ICD-10-CM | POA: Diagnosis not present

## 2016-09-04 DIAGNOSIS — D8989 Other specified disorders involving the immune mechanism, not elsewhere classified: Secondary | ICD-10-CM | POA: Diagnosis not present

## 2016-09-04 DIAGNOSIS — Z940 Kidney transplant status: Secondary | ICD-10-CM | POA: Diagnosis not present

## 2016-09-04 DIAGNOSIS — B258 Other cytomegaloviral diseases: Secondary | ICD-10-CM | POA: Diagnosis not present

## 2016-09-04 DIAGNOSIS — Z7951 Long term (current) use of inhaled steroids: Secondary | ICD-10-CM | POA: Diagnosis not present

## 2016-09-04 DIAGNOSIS — I10 Essential (primary) hypertension: Secondary | ICD-10-CM | POA: Diagnosis not present

## 2016-09-04 DIAGNOSIS — E785 Hyperlipidemia, unspecified: Secondary | ICD-10-CM | POA: Diagnosis not present

## 2016-09-04 DIAGNOSIS — Z7982 Long term (current) use of aspirin: Secondary | ICD-10-CM | POA: Diagnosis not present

## 2016-09-04 DIAGNOSIS — I120 Hypertensive chronic kidney disease with stage 5 chronic kidney disease or end stage renal disease: Secondary | ICD-10-CM | POA: Diagnosis not present

## 2016-09-04 DIAGNOSIS — Z79899 Other long term (current) drug therapy: Secondary | ICD-10-CM | POA: Diagnosis not present

## 2016-09-04 DIAGNOSIS — N186 End stage renal disease: Secondary | ICD-10-CM | POA: Diagnosis not present

## 2016-09-04 DIAGNOSIS — Z4822 Encounter for aftercare following kidney transplant: Secondary | ICD-10-CM | POA: Diagnosis not present

## 2016-09-04 HISTORY — DX: Other complications of procedures, not elsewhere classified, initial encounter: T81.89XA

## 2016-09-04 HISTORY — DX: Proteinuria, unspecified: R80.9

## 2016-09-05 ENCOUNTER — Telehealth: Payer: Self-pay | Admitting: Family Medicine

## 2016-09-05 NOTE — Telephone Encounter (Signed)
Patient scheduled AWV with Hoyle Sauer 09/18/16 at 7:3am

## 2016-09-17 NOTE — Progress Notes (Signed)
Pre visit review using our clinic review tool, if applicable. No additional management support is needed unless otherwise documented below in the visit note. 

## 2016-09-17 NOTE — Progress Notes (Signed)
Subjective:   [name] is a 71 y.o. female who presents for an Initial Medicare Annual Wellness Visit.  Review of Systems    No ROS.  Medicare Wellness Visit.  Cardiac Risk Factors include: advanced age (>13men, >62 women);dyslipidemia;obesity (BMI >30kg/m2);sedentary lifestyle;hypertension  Sleep patterns: no sleep issues, falls asleep easily and gets up 0-1 times nightly to void. Wears CPAP 6 hours nightly.   Home Safety/Smoke Alarms: Feels safe in home. Smoke alarms in place. Security system in place.   Living environment; residence and Firearm Safety: Lives w/ husband. 2-story house, number of inside stairs: 1 flight, can live on one level, equipment: Cane, Type: Narrow [employer]. Seat Belt Safety/Bike Helmet: Wears seat belt.   Counseling:   Eye Exam- Due for eye exam. Follows w/ [facility]. Dental- [facility] every 6 months.   Female:   Pap- Per GYN       Mammo- last 07/24/13. BI-RADS CATEGORY  0: Incomplete. Need additional imaging evaluation. [name] completed 08/03/13-BI-RADS CATEGORY  2: Benign Finding       Dexa scan- last 07/17/11. No report on file.        CCS- last 07/30/07. Dr. Collene Mares. 10 year recall. No report on file.      Objective:    Today's Vitals   09/18/16 0746  BP: (!) 148/84  Pulse: 63  Resp: 16  SpO2: 97%  Weight: 233 lb 3.2 oz (105.8 kg)  Height: 5\' 4"  (1.626 m)   Body mass index is 40.03 kg/m.  BP Readings from Last 3 Encounters:  09/18/16 (!) 148/84  05/08/16 (!) 168/81  09/13/15 124/72  Pt took morning BP meds approx 30 minutes ago.  Current Medications (verified) Outpatient Encounter Prescriptions as of 09/18/2016  Medication Sig  . acetaminophen (TYLENOL) 500 MG tablet Take 1,000 mg by mouth every 6 (six) hours as needed (For pain.).   Marland Kitchen albuterol (PROVENTIL HFA;VENTOLIN HFA) 108 (90 Base) MCG/ACT inhaler Inhale 2 puffs into the lungs every 6 (six) hours as needed for wheezing.  Marland Kitchen aspirin EC 81 MG tablet Take 81 mg by  mouth.  . beclomethasone (QVAR) 40 MCG/ACT inhaler Inhale 2 puffs into the lungs 2 (two) times daily.  Marland Kitchen diltiazem (CARDIZEM CD) 240 MG 24 hr capsule Take 1 capsule (240 mg total) by mouth daily after breakfast.  . docusate sodium (COLACE) 100 MG capsule Take 100 mg by mouth daily as needed (For constipation.).   Marland Kitchen furosemide (LASIX) 20 MG tablet Take 20 mg by mouth daily.  . sertraline (ZOLOFT) 50 MG tablet 1 tab by mouth daily (Patient taking differently: Take 50 mg by mouth daily. )  . VENTOLIN HFA 108 (90 Base) MCG/ACT inhaler INHALE 2 PUFFS INTO THE LUNGS EVERY 6 (SIX) HOURS AS NEEDED FOR WHEEZING OR SHORTNESS OF BREATH.  . metoprolol tartrate (LOPRESSOR) 25 MG tablet Take 1.5 tablets (37.5 mg total) by mouth 2 (two) times daily. (Patient taking differently: Take 50 mg by mouth 2 (two) times daily. )  . [DISCONTINUED] azithromycin (ZITHROMAX Z-PAK) 250 MG tablet As directed (Patient not taking: Reported on 09/18/2016)  . [DISCONTINUED] omeprazole (PRILOSEC) 20 MG capsule Take 20 mg by mouth daily.   . [DISCONTINUED] Specialty Vitamins Products (MAGNESIUM, AMINO ACID CHELATE,) 133 MG tablet Take 3 tablets by mouth daily.  . [DISCONTINUED] tacrolimus (PROGRAF) 0.5 MG capsule Take 0.5 mg by mouth every evening.  . [DISCONTINUED] Tacrolimus 1 MG TB24 Take 1 tablet by mouth daily.  . [DISCONTINUED] valGANciclovir (VALCYTE) 450  MG tablet Take 900 mg by mouth 2 (two) times daily.   No facility-administered encounter medications on file as of 09/18/2016.     Allergies (verified) Ace inhibitors and Warfarin sodium   History: Past Medical History:  Diagnosis Date  . Anemia   . Arthritis    HIP  . Asthma    as a child  . Blood transfusion    never had a reaction to blood transfusion  . Cardiomyopathy Mar 2013   Mild, EF 50-55% by Mar 2013 ECHO, diast dysfxn II  . Constipation    takes Miralax daily  . Depression    takes Zoloft daily  . DVT of lower extremity, bilateral ([place]) 12/21/11    "they're there now; been there for 2 wks"  . Eczema   . ESRD (end stage renal disease) on dialysis [facility]) 09/2011   Started dialysis Mar 2013 on M/W/F on [place]  . Fractures, stress    in both feet--6 OR 7 YRS AGO--RESOLVED  . Gout    doesn't require meds   . HCAP (healthcare-associated pneumonia)    09/10/11  . Hypercalcemia    09/10/11  . Hypertension    takes Diltiazem daily   . Morbid obesity ([place])   . Nonischemic dilated cardiomyopathy ([place])   . Oligouria   . PAF (paroxysmal atrial fibrillation) ([facility])     HX OF CEREBRAL BLEED WHILE ON COUMADIN-SO PT NOT ON ANY BLOOD THINNERS NOW  . Peripheral vascular disease ([place])   . Sleep apnea    HAS CPAP-sleep study done 2007--PT NOT USING AT PRESENT  . Stroke [facility]) 2009   denies residual, hemorrhagic now off coumadin  . Stroke due to intracerebral hemorrhage ([place]) 2009   Past Surgical History:  Procedure Laterality Date  . AV FISTULA PLACEMENT  09/28/2011   Procedure: ARTERIOVENOUS (AV) FISTULA CREATION;  Surgeon: Rosetta Posner, MD;  Location: [place];  Service: Vascular;  Laterality: Left;  . CARDIAC VALVE SURGERY  1960    FOR ATRIAL SEPTAL DEFECT  . CHOLECYSTECTOMY     1980's  . COLONOSCOPY    . CYSTOSCOPY     many yrs ago  . FEMUR FRACTURE SURGERY  03/2011; 09/03/2011   right; "had 2, 2 wk apart in 2012; broke it again 08/2011 & had OR"  . I&D EXTREMITY  09/15/2011   Procedure: IRRIGATION AND DEBRIDEMENT EXTREMITY w REMOVAL OF HARDWARE;  Surgeon: [name], MD;  Location: [place];  Service: Orthopedics;  Laterality: Right;  . INCISION AND DRAINAGE HIP  12/21/2011   Procedure: IRRIGATION AND DEBRIDEMENT HIP;  Surgeon: [name], MD;  Location: [place];  Service: Orthopedics;  Laterality: Right;  I&D RIGHT HIP WITH PLACEMENT ANTIBIOTIC SPACER  . INSERTION OF DIALYSIS CATHETER     Procedure: INSERTION OF DIALYSIS CATHETER;  Surgeon: Rosetta Posner, MD;  Location: [place];  Service: Vascular;  Laterality: Right;  . IRRIGATION AND  DEBRIDEMENT ABSCESS  12/21/11   right hip  . KIDNEY TRANSPLANT  07/19/15  . TOTAL HIP ARTHROPLASTY  02/2011   right THA 02/2011, I&D/removal of hardware 09/2011,, repeat I&D Jun 2013, reimplantation R THA 03-26-2012  . TOTAL HIP REVISION  03/25/2012   Procedure: TOTAL HIP REVISION;  Surgeon: [name], MD;  Location: [facility];  Service: Orthopedics;  Laterality: Right;  Right Total Hip Reimplantation  . VENA CAVA FILTER PLACEMENT  11/2011   Family History  Problem Relation Age of Onset  . Cancer Father   .  Diabetes Mother   . Hypertension Mother   . Hodgkin's lymphoma  34    dscd---HODGKINS DISEASE   Social History   Occupational History  . Not on file.   Social History Main Topics  . Smoking status: Never Smoker  . Smokeless tobacco: Never Used  . Alcohol use No  . Drug use: No  . Sexual activity: No    Tobacco Counseling Counseling given: Not Answered   Activities of Daily Living In your present state of health, do you have any difficulty performing the following activities: 09/18/2016  Hearing? N  Vision? N  Difficulty concentrating or making decisions? N  Walking or climbing stairs? N  Dressing or bathing? N  Doing errands, shopping? N  Preparing Food and eating ? N  Using the Toilet? N  In the past six months, have you accidently leaked urine? Y  Do you have problems with loss of bowel control? N  Managing your Medications? N  Managing your Finances? N  Housekeeping or managing your Housekeeping? Y  Some recent data might be hidden    Immunizations and Health Maintenance Immunization History  Administered Date(s) Administered  . Influenza Split 03/27/2012  . Influenza Whole 04/29/2008  . Influenza,inj,Quad PF,36+ Mos 04/24/2013  . Influenza-Unspecified 04/29/2014, 03/17/2015, 04/19/2016  . Pneumococcal Conjugate-13 03/02/2014  . Pneumococcal Polysaccharide-23 04/25/2005, 09/18/2016  . Tdap 05/08/2013   Health Maintenance Due  Topic Date Due  . MAMMOGRAM   07/25/2015    Patient Care Team: [name], DO as PCP - General Sueanne Margarita, MD as Consulting Physician (Cardiology) Paralee Cancel, MD as Consulting Physician (Orthopedic Surgery) Fleet Contras, MD as Consulting Physician (Nephrology) [place] any recent Medical Services you may have received from other than [facility] providers in the past year (date may be approximate).     Assessment:   This is a routine wellness examination for [place]. Physical assessment deferred to PCP.  Hearing/Vision screen Hearing Screening Comments: Able to hear conversational tones w/o difficulty. No issues reported. Passes whisper test. Vision Screening Comments: Due for eye exam. Follows w/ [facility]. Mole on R eye that pt wishes to have removed. Wears glasses for driving and reading glasses.  Dietary issues and exercise activities discussed: Current Exercise Habits: The patient does not participate in regular exercise at present, Exercise limited by: orthopedic condition(s) (h/o multiple leg surgeries)  Diet (meal preparation, eat out, water intake, caffeinated beverages, dairy products, fruits and vegetables): on average, 2 meals per day. Self reported "picky eater." 1 snack during the day. Does not care for much fruit, but does eat apples and peaches. Combination of eating at home and eating out. Drinks flavored water, regular Pepsi, and tea.   Goals    . Increase physical activity          Restart water aerobics.    . Weight (lb) < 200 lb (90.7 kg)      Depression Screen PHQ 2/9 Scores 09/18/2016 09/13/2015 03/02/2014  PHQ - 2 Score 0 0 0    Fall Risk Fall Risk  09/18/2016 09/13/2015 03/02/2014 12/27/2011  Falls in the past year? Yes No Yes -  Number falls in past yr: 1 - 1 -  Injury with Fall? No - No -  Risk for fall due to : History of fall(s);Impaired balance/gait;Impaired mobility - Other (Comment) Impaired mobility  Risk for fall due to (comments): - - Passed out  at the scale after dialysis -    Cognitive Function: MMSE -  Mini Mental State Exam 09/18/2016  Orientation to time 5  Orientation to Place 5  Registration 3  Attention/ Calculation 5  Recall 3  Language- name 2 objects 2  Language- repeat 1  Language- follow 3 step command 3  Language- read & follow direction 1  Write a sentence 1  Copy design 0  Total score 29        Screening Tests Health Maintenance  Topic Date Due  . MAMMOGRAM  07/25/2015  . COLONOSCOPY  07/29/2017  . TETANUS/TDAP  05/09/2023  . INFLUENZA VACCINE  Completed  . DEXA SCAN  Completed  . Hepatitis C Screening  Completed  . PNA vac Low Risk Adult  Completed      Plan:    Schedule follow-up w/ PCP at pt convenience.  Pt prefers to scheduled MMG and DEXA through GYN.   Schedule routine vision exam at your convenience.   Bring a copy of your advance directives to your next office visit.  PPSV 23 given today.  Records release faxed to Dr. Juanita Craver for colonoscopy report.  During the course of the visit, M[place] was educated and counseled about the following appropriate screening and preventive services:   Vaccines to include Pneumoccal, Influenza, Hepatitis B, Td, Zostavax, HCV  Cardiovascular disease screening  Colorectal cancer screening  Bone density screening  Diabetes screening  Glaucoma screening  Mammography/PAP  Nutrition counseling  Patient Instructions (the written plan) were given to the patient.    Dorrene German, RN   09/18/2016

## 2016-09-18 ENCOUNTER — Encounter: Payer: Self-pay | Admitting: *Deleted

## 2016-09-18 ENCOUNTER — Ambulatory Visit (INDEPENDENT_AMBULATORY_CARE_PROVIDER_SITE_OTHER): Payer: Medicare Other | Admitting: *Deleted

## 2016-09-18 VITALS — BP 148/84 | HR 63 | Resp 16 | Ht 64.0 in | Wt 233.2 lb

## 2016-09-18 DIAGNOSIS — Z0000 Encounter for general adult medical examination without abnormal findings: Secondary | ICD-10-CM | POA: Diagnosis not present

## 2016-09-18 DIAGNOSIS — Z23 Encounter for immunization: Secondary | ICD-10-CM | POA: Diagnosis not present

## 2016-09-18 NOTE — Patient Instructions (Addendum)
[name] , Thank you for taking time to come for your Medicare Wellness Visit. I appreciate your ongoing commitment to your health goals. Please review the following plan we discussed and let me know if I can assist you in the future.   You got your pneumococcal vaccine today. Bring a copy of your advance directives to your next office visit. Schedule routine vision exam at your convenience.  Contact your gynecologist to schedule your mammogram and bone density scans.  These are the goals we discussed: Goals    . Increase physical activity          Restart water aerobics.    . Weight (lb) < 200 lb (90.7 kg)       This is a list of the screening recommended for you and due dates:  Health Maintenance  Topic Date Due  . Pneumonia vaccines (2 of 2 - PPSV23) 03/03/2015  . Mammogram  07/25/2015  . Colon Cancer Screening  07/29/2017  . Tetanus Vaccine  05/09/2023  . Flu Shot  Completed  . DEXA scan (bone density measurement)  Completed  .  Hepatitis C: One time screening is recommended by Center for Disease Control  (CDC) for  adults born from [birth year] through 1963.   Completed   Pneumococcal Polysaccharide Vaccine: What You Need to Know 1. Why get vaccinated? Vaccination can protect older adults (and some children and younger adults) from pneumococcal disease. Pneumococcal disease is caused by bacteria that can spread from person to person through close contact. It can cause ear infections, and it can also lead to more serious infections of the:  Lungs (pneumonia),  Blood (bacteremia), and  Covering of the brain and spinal cord (meningitis). Meningitis can cause deafness and brain damage, and it can be fatal. Anyone can get pneumococcal disease, but children under 53 years of age, people with certain medical conditions, adults over 5 years of age, and cigarette smokers are at the highest risk. About 18,000 older adults die each year from pneumococcal disease in the [place]. Treatment of pneumococcal infections with penicillin and other drugs used to be more effective. But some strains of the disease have become resistant to these drugs. This makes prevention of the disease, through vaccination, even more important. 2. Pneumococcal polysaccharide vaccine (PPSV23) Pneumococcal polysaccharide vaccine (PPSV23) protects against 23 types of pneumococcal bacteria. It will not prevent all pneumococcal disease. PPSV23 is recommended for:  All adults 33 years of age and older,  Anyone 2 through 69 years of age with certain long-term health problems,  Anyone 2 through 69 years of age with a weakened immune system,  Adults 21 through 69 years of age who smoke cigarettes or have asthma. Most people need only one dose of PPSV. A second dose is recommended for certain high-risk groups. People 34 and older should get a dose even if they have gotten one or more doses of the vaccine before they turned 65. Your healthcare provider can give you more information about these recommendations. Most healthy adults develop protection within 2 to 3 weeks of getting the shot. 3. Some people should not get this vaccine  Anyone who has had a life-threatening allergic reaction to PPSV should not get another dose.  Anyone who has a severe allergy to any component of PPSV should not receive it. Tell your provider if you have any severe allergies.  Anyone who is moderately or severely ill when the shot is scheduled may be asked to wait until they recover  before getting the vaccine. Someone with a mild illness can usually be vaccinated.  Children less than 50 years of age should not receive this vaccine.  There is no evidence that PPSV is harmful to either a pregnant woman or to her fetus. However, as a precaution, women who need the vaccine should be vaccinated before becoming pregnant, if possible. 4. Risks of a vaccine reaction With any medicine, including vaccines, there is a  chance of side effects. These are usually mild and go away on their own, but serious reactions are also possible. About half of people who get PPSV have mild side effects, such as redness or pain where the shot is given, which go away within about two days. Less than 1 out of 100 people develop a fever, muscle aches, or more severe local reactions. Problems that could happen after any vaccine:   People sometimes faint after a medical procedure, including vaccination. Sitting or lying down for about 15 minutes can help prevent fainting, and injuries caused by a fall. Tell your doctor if you feel dizzy, or have vision changes or ringing in the ears.  Some people get severe pain in the shoulder and have difficulty moving the arm where a shot was given. This happens very rarely.  Any medication can cause a severe allergic reaction. Such reactions from a vaccine are very rare, estimated at about 1 in a million doses, and would happen within a few minutes to a few hours after the vaccination. As with any medicine, there is a very remote chance of a vaccine causing a serious injury or death. The safety of vaccines is always being monitored. For more information, visit: http://www.aguilar.org/ 5. What if there is a serious reaction? What should I look for?  Look for anything that concerns you, such as signs of a severe allergic reaction, very high fever, or unusual behavior. Signs of a severe allergic reaction can include hives, swelling of the face and throat, difficulty breathing, a fast heartbeat, dizziness, and weakness. These would usually start a few minutes to a few hours after the vaccination. What should I do?  If you think it is a severe allergic reaction or other emergency that can't wait, call 9-1-1 or get to the nearest hospital. Otherwise, call your doctor. Afterward, the reaction should be reported to the Vaccine Adverse Event Reporting System (VAERS). Your doctor might file this report,  or you can do it yourself through the VAERS web site at www.vaers.SamedayNews.es, or by calling 5186292389. VAERS does not give medical advice.  6. How can I learn more?  Ask your doctor. He or she can give you the vaccine package insert or suggest other sources of information.  Call your local or state health department.  Contact the Centers for Disease Control and Prevention (CDC):  Call (330) 083-2763 (1-800-CDC-INFO) or  Visit CDC's website at http://hunter.com/ CDC Pneumococcal Polysaccharide Vaccine VIS (11/06/13) This information is not intended to replace advice given to you by your health care provider. Make sure you discuss any questions you have with your health care provider. Document Released: 04/29/2006 Document Revised: 03/22/2016 Document Reviewed: 03/22/2016 Elsevier Interactive Patient Education  2017 [employer].

## 2016-09-18 NOTE — Progress Notes (Signed)
reveiwed

## 2016-10-01 DIAGNOSIS — L723 Sebaceous cyst: Secondary | ICD-10-CM | POA: Diagnosis not present

## 2016-10-01 DIAGNOSIS — H25013 Cortical age-related cataract, bilateral: Secondary | ICD-10-CM | POA: Diagnosis not present

## 2016-10-02 DIAGNOSIS — Z940 Kidney transplant status: Secondary | ICD-10-CM | POA: Diagnosis not present

## 2016-10-02 DIAGNOSIS — D8989 Other specified disorders involving the immune mechanism, not elsewhere classified: Secondary | ICD-10-CM | POA: Diagnosis not present

## 2016-10-02 DIAGNOSIS — Z86718 Personal history of other venous thrombosis and embolism: Secondary | ICD-10-CM | POA: Diagnosis not present

## 2016-10-02 DIAGNOSIS — E669 Obesity, unspecified: Secondary | ICD-10-CM | POA: Diagnosis not present

## 2016-10-02 DIAGNOSIS — Z792 Long term (current) use of antibiotics: Secondary | ICD-10-CM | POA: Diagnosis not present

## 2016-10-02 DIAGNOSIS — I10 Essential (primary) hypertension: Secondary | ICD-10-CM | POA: Diagnosis not present

## 2016-10-02 DIAGNOSIS — E785 Hyperlipidemia, unspecified: Secondary | ICD-10-CM | POA: Diagnosis not present

## 2016-10-02 DIAGNOSIS — R809 Proteinuria, unspecified: Secondary | ICD-10-CM | POA: Diagnosis not present

## 2016-10-02 DIAGNOSIS — Z7952 Long term (current) use of systemic steroids: Secondary | ICD-10-CM | POA: Diagnosis not present

## 2016-10-02 DIAGNOSIS — Z4822 Encounter for aftercare following kidney transplant: Secondary | ICD-10-CM | POA: Diagnosis not present

## 2016-10-02 DIAGNOSIS — Z79899 Other long term (current) drug therapy: Secondary | ICD-10-CM | POA: Diagnosis not present

## 2016-10-02 DIAGNOSIS — I4891 Unspecified atrial fibrillation: Secondary | ICD-10-CM | POA: Diagnosis not present

## 2016-10-12 ENCOUNTER — Other Ambulatory Visit: Payer: Self-pay | Admitting: Family Medicine

## 2016-10-12 DIAGNOSIS — F329 Major depressive disorder, single episode, unspecified: Secondary | ICD-10-CM

## 2016-10-12 DIAGNOSIS — F32A Depression, unspecified: Secondary | ICD-10-CM

## 2016-10-14 DIAGNOSIS — S52501A Unspecified fracture of the lower end of right radius, initial encounter for closed fracture: Secondary | ICD-10-CM | POA: Diagnosis not present

## 2016-10-15 DIAGNOSIS — S52501A Unspecified fracture of the lower end of right radius, initial encounter for closed fracture: Secondary | ICD-10-CM | POA: Diagnosis not present

## 2016-10-16 ENCOUNTER — Encounter (HOSPITAL_BASED_OUTPATIENT_CLINIC_OR_DEPARTMENT_OTHER): Payer: Self-pay | Admitting: *Deleted

## 2016-10-16 DIAGNOSIS — Z940 Kidney transplant status: Secondary | ICD-10-CM | POA: Diagnosis not present

## 2016-10-17 ENCOUNTER — Encounter (HOSPITAL_BASED_OUTPATIENT_CLINIC_OR_DEPARTMENT_OTHER)
Admission: RE | Admit: 2016-10-17 | Discharge: 2016-10-17 | Disposition: A | Discharge status: Home / Self Care | Payer: Medicare Other | Source: Ambulatory Visit | Attending: Orthopedic Surgery | Admitting: Orthopedic Surgery

## 2016-10-17 ENCOUNTER — Other Ambulatory Visit: Payer: Self-pay | Admitting: Orthopedic Surgery

## 2016-10-17 ENCOUNTER — Encounter (HOSPITAL_BASED_OUTPATIENT_CLINIC_OR_DEPARTMENT_OTHER): Payer: Self-pay | Admitting: *Deleted

## 2016-10-17 DIAGNOSIS — Z792 Long term (current) use of antibiotics: Secondary | ICD-10-CM | POA: Diagnosis not present

## 2016-10-17 DIAGNOSIS — I480 Paroxysmal atrial fibrillation: Secondary | ICD-10-CM | POA: Diagnosis not present

## 2016-10-17 DIAGNOSIS — Z7952 Long term (current) use of systemic steroids: Secondary | ICD-10-CM | POA: Diagnosis not present

## 2016-10-17 DIAGNOSIS — F329 Major depressive disorder, single episode, unspecified: Secondary | ICD-10-CM | POA: Diagnosis not present

## 2016-10-17 DIAGNOSIS — Z96641 Presence of right artificial hip joint: Secondary | ICD-10-CM | POA: Diagnosis not present

## 2016-10-17 DIAGNOSIS — K5900 Constipation, unspecified: Secondary | ICD-10-CM | POA: Diagnosis not present

## 2016-10-17 DIAGNOSIS — I420 Dilated cardiomyopathy: Secondary | ICD-10-CM | POA: Diagnosis not present

## 2016-10-17 DIAGNOSIS — Z7951 Long term (current) use of inhaled steroids: Secondary | ICD-10-CM | POA: Diagnosis not present

## 2016-10-17 DIAGNOSIS — Z8673 Personal history of transient ischemic attack (TIA), and cerebral infarction without residual deficits: Secondary | ICD-10-CM | POA: Diagnosis not present

## 2016-10-17 DIAGNOSIS — J45909 Unspecified asthma, uncomplicated: Secondary | ICD-10-CM | POA: Diagnosis not present

## 2016-10-17 DIAGNOSIS — S52551A Other extraarticular fracture of lower end of right radius, initial encounter for closed fracture: Secondary | ICD-10-CM | POA: Diagnosis not present

## 2016-10-17 DIAGNOSIS — Z940 Kidney transplant status: Secondary | ICD-10-CM | POA: Diagnosis not present

## 2016-10-17 DIAGNOSIS — Z7982 Long term (current) use of aspirin: Secondary | ICD-10-CM | POA: Diagnosis not present

## 2016-10-17 DIAGNOSIS — Z79899 Other long term (current) drug therapy: Secondary | ICD-10-CM | POA: Diagnosis not present

## 2016-10-17 DIAGNOSIS — Z6839 Body mass index (BMI) 39.0-39.9, adult: Secondary | ICD-10-CM | POA: Diagnosis not present

## 2016-10-17 DIAGNOSIS — G4730 Sleep apnea, unspecified: Secondary | ICD-10-CM | POA: Diagnosis not present

## 2016-10-17 DIAGNOSIS — W19XXXA Unspecified fall, initial encounter: Secondary | ICD-10-CM | POA: Diagnosis not present

## 2016-10-17 DIAGNOSIS — Z888 Allergy status to other drugs, medicaments and biological substances status: Secondary | ICD-10-CM | POA: Diagnosis not present

## 2016-10-17 LAB — BASIC METABOLIC PANEL
Anion gap: 9 (ref 5–15)
BUN: 14 mg/dL (ref 6–20)
CO2: 28 mmol/L (ref 22–32)
Calcium: 9.8 mg/dL (ref 8.9–10.3)
Chloride: 104 mmol/L (ref 101–111)
Creatinine, Ser: 0.87 mg/dL (ref 0.44–1.00)
GFR calc Af Amer: >60 mL/min (ref >60)
GFR calc non Af Amer: >60 mL/min (ref >60)
Glucose, Bld: 95 mg/dL (ref 65–99)
Potassium: 4.3 mmol/L (ref 3.5–5.1)
Sodium: 141 mmol/L (ref 135–145)

## 2016-10-17 NOTE — Progress Notes (Signed)
Dr. Jenita Seashore reviewed EKG and history, will proceed with surgery as scheduled.

## 2016-10-18 ENCOUNTER — Ambulatory Visit (HOSPITAL_BASED_OUTPATIENT_CLINIC_OR_DEPARTMENT_OTHER): Payer: Medicare Other | Admitting: Anesthesiology

## 2016-10-18 ENCOUNTER — Encounter (HOSPITAL_BASED_OUTPATIENT_CLINIC_OR_DEPARTMENT_OTHER): Payer: Self-pay | Admitting: *Deleted

## 2016-10-18 ENCOUNTER — Ambulatory Visit (HOSPITAL_BASED_OUTPATIENT_CLINIC_OR_DEPARTMENT_OTHER)
Admission: RE | Admit: 2016-10-18 | Discharge: 2016-10-18 | Disposition: A | Discharge status: Home / Self Care | Payer: Medicare Other | Source: Ambulatory Visit | Attending: Orthopedic Surgery | Admitting: Orthopedic Surgery

## 2016-10-18 ENCOUNTER — Encounter (HOSPITAL_BASED_OUTPATIENT_CLINIC_OR_DEPARTMENT_OTHER)
Admission: RE | Disposition: A | Discharge status: Home / Self Care | Payer: Self-pay | Source: Ambulatory Visit | Attending: Orthopedic Surgery

## 2016-10-18 DIAGNOSIS — Z8673 Personal history of transient ischemic attack (TIA), and cerebral infarction without residual deficits: Secondary | ICD-10-CM | POA: Insufficient documentation

## 2016-10-18 DIAGNOSIS — Z7952 Long term (current) use of systemic steroids: Secondary | ICD-10-CM | POA: Insufficient documentation

## 2016-10-18 DIAGNOSIS — N186 End stage renal disease: Secondary | ICD-10-CM | POA: Diagnosis not present

## 2016-10-18 DIAGNOSIS — Z79899 Other long term (current) drug therapy: Secondary | ICD-10-CM | POA: Diagnosis not present

## 2016-10-18 DIAGNOSIS — F329 Major depressive disorder, single episode, unspecified: Secondary | ICD-10-CM | POA: Diagnosis not present

## 2016-10-18 DIAGNOSIS — S52551A Other extraarticular fracture of lower end of right radius, initial encounter for closed fracture: Secondary | ICD-10-CM | POA: Diagnosis not present

## 2016-10-18 DIAGNOSIS — I480 Paroxysmal atrial fibrillation: Secondary | ICD-10-CM | POA: Insufficient documentation

## 2016-10-18 DIAGNOSIS — Z7951 Long term (current) use of inhaled steroids: Secondary | ICD-10-CM | POA: Insufficient documentation

## 2016-10-18 DIAGNOSIS — Z792 Long term (current) use of antibiotics: Secondary | ICD-10-CM | POA: Insufficient documentation

## 2016-10-18 DIAGNOSIS — Z6839 Body mass index (BMI) 39.0-39.9, adult: Secondary | ICD-10-CM | POA: Insufficient documentation

## 2016-10-18 DIAGNOSIS — Z888 Allergy status to other drugs, medicaments and biological substances status: Secondary | ICD-10-CM | POA: Insufficient documentation

## 2016-10-18 DIAGNOSIS — I420 Dilated cardiomyopathy: Secondary | ICD-10-CM | POA: Insufficient documentation

## 2016-10-18 DIAGNOSIS — S52501A Unspecified fracture of the lower end of right radius, initial encounter for closed fracture: Secondary | ICD-10-CM | POA: Diagnosis not present

## 2016-10-18 DIAGNOSIS — J45909 Unspecified asthma, uncomplicated: Secondary | ICD-10-CM | POA: Insufficient documentation

## 2016-10-18 DIAGNOSIS — I120 Hypertensive chronic kidney disease with stage 5 chronic kidney disease or end stage renal disease: Secondary | ICD-10-CM | POA: Diagnosis not present

## 2016-10-18 DIAGNOSIS — W19XXXA Unspecified fall, initial encounter: Secondary | ICD-10-CM | POA: Insufficient documentation

## 2016-10-18 DIAGNOSIS — Z7982 Long term (current) use of aspirin: Secondary | ICD-10-CM | POA: Insufficient documentation

## 2016-10-18 DIAGNOSIS — G4730 Sleep apnea, unspecified: Secondary | ICD-10-CM | POA: Insufficient documentation

## 2016-10-18 DIAGNOSIS — K5900 Constipation, unspecified: Secondary | ICD-10-CM | POA: Diagnosis not present

## 2016-10-18 DIAGNOSIS — Z940 Kidney transplant status: Secondary | ICD-10-CM | POA: Insufficient documentation

## 2016-10-18 DIAGNOSIS — Z96641 Presence of right artificial hip joint: Secondary | ICD-10-CM | POA: Insufficient documentation

## 2016-10-18 HISTORY — PX: OPEN REDUCTION INTERNAL FIXATION (ORIF) DISTAL RADIAL FRACTURE: SHX5989

## 2016-10-18 HISTORY — DX: Unspecified fracture of the lower end of right radius, initial encounter for closed fracture: S52.501A

## 2016-10-18 SURGERY — OPEN REDUCTION INTERNAL FIXATION (ORIF) DISTAL RADIUS FRACTURE
Anesthesia: Monitor Anesthesia Care | Site: Wrist | Laterality: Right

## 2016-10-18 MED ORDER — MIDAZOLAM HCL 2 MG/2ML IJ SOLN
INTRAMUSCULAR | Status: AC
  Filled 2016-10-18: qty 2

## 2016-10-18 MED ORDER — HYDROCODONE-ACETAMINOPHEN 10-325 MG PO TABS: 1.0000 | ORAL_TABLET | Freq: Four times a day (QID) | ORAL | 0 refills | Status: DC | PRN

## 2016-10-18 MED ORDER — FENTANYL CITRATE (PF) 100 MCG/2ML IJ SOLN
50.0000 ug | INTRAMUSCULAR | Status: DC | PRN
  Administered 2016-10-18: 50 ug via INTRAVENOUS

## 2016-10-18 MED ORDER — LIDOCAINE 2% (20 MG/ML) 5 ML SYRINGE
INTRAMUSCULAR | Status: AC
  Filled 2016-10-18: qty 5

## 2016-10-18 MED ORDER — CEFAZOLIN SODIUM-DEXTROSE 2-4 GM/100ML-% IV SOLN
2.0000 g | INTRAVENOUS | Status: AC
  Administered 2016-10-18: 3 g via INTRAVENOUS

## 2016-10-18 MED ORDER — ONDANSETRON HCL 4 MG/2ML IJ SOLN
INTRAMUSCULAR | Status: AC
  Filled 2016-10-18: qty 2

## 2016-10-18 MED ORDER — CEFAZOLIN SODIUM-DEXTROSE 2-4 GM/100ML-% IV SOLN
INTRAVENOUS | Status: AC
  Filled 2016-10-18: qty 200

## 2016-10-18 MED ORDER — ONDANSETRON HCL 4 MG/2ML IJ SOLN
4.0000 mg | Freq: Once | INTRAMUSCULAR | Status: DC | PRN
Start: 2016-10-18 — End: 2016-10-18

## 2016-10-18 MED ORDER — FENTANYL CITRATE (PF) 100 MCG/2ML IJ SOLN
INTRAMUSCULAR | Status: AC
  Filled 2016-10-18: qty 2

## 2016-10-18 MED ORDER — MEPERIDINE HCL 25 MG/ML IJ SOLN: 6.2500 mg | INTRAMUSCULAR | Status: DC | PRN

## 2016-10-18 MED ORDER — HYDROMORPHONE HCL 1 MG/ML IJ SOLN: 0.2500 mg | INTRAMUSCULAR | Status: DC | PRN

## 2016-10-18 MED ORDER — MIDAZOLAM HCL 2 MG/2ML IJ SOLN
1.0000 mg | INTRAMUSCULAR | Status: DC | PRN
  Administered 2016-10-18: 1 mg via INTRAVENOUS

## 2016-10-18 MED ORDER — PROPOFOL 500 MG/50ML IV EMUL
INTRAVENOUS | Status: DC | PRN
  Administered 2016-10-18: 25 ug/kg/min via INTRAVENOUS

## 2016-10-18 MED ORDER — LACTATED RINGERS IV SOLN
INTRAVENOUS | Status: DC
  Administered 2016-10-18 (×2): via INTRAVENOUS

## 2016-10-18 MED ORDER — SCOPOLAMINE 1 MG/3DAYS TD PT72: 1.0000 | MEDICATED_PATCH | Freq: Once | TRANSDERMAL | Status: DC | PRN

## 2016-10-18 MED ORDER — PROPOFOL 10 MG/ML IV BOLUS
INTRAVENOUS | Status: DC | PRN
  Administered 2016-10-18 (×2): 20 mg via INTRAVENOUS

## 2016-10-18 MED ORDER — DEXAMETHASONE SODIUM PHOSPHATE 10 MG/ML IJ SOLN
INTRAMUSCULAR | Status: AC
  Filled 2016-10-18: qty 1

## 2016-10-18 SURGICAL SUPPLY — 73 items
BANDAGE ACE 3X5.8 VEL STRL LF (GAUZE/BANDAGES/DRESSINGS) ×2 IMPLANT
BANDAGE ACE 4X5 VEL STRL LF (GAUZE/BANDAGES/DRESSINGS) IMPLANT
BIT DRILL 2 FAST STEP (BIT) ×2 IMPLANT
BIT DRILL 2.5X4 QC (BIT) ×2 IMPLANT
BLADE MINI RND TIP GREEN BEAV (BLADE) IMPLANT
BLADE SURG 15 STRL LF DISP TIS (BLADE) ×1 IMPLANT
BLADE SURG 15 STRL SS (BLADE) ×1
BNDG COHESIVE 4X5 TAN STRL (GAUZE/BANDAGES/DRESSINGS) ×2 IMPLANT
BNDG ESMARK 4X9 LF (GAUZE/BANDAGES/DRESSINGS) ×2 IMPLANT
BNDG GAUZE ELAST 4 BULKY (GAUZE/BANDAGES/DRESSINGS) ×2 IMPLANT
CLSR STERI-STRIP ANTIMIC 1/2X4 (GAUZE/BANDAGES/DRESSINGS) IMPLANT
CORDS BIPOLAR (ELECTRODE) ×2 IMPLANT
COVER BACK TABLE 60X90IN (DRAPES) ×2 IMPLANT
CUFF TOURNIQUET SINGLE 18IN (TOURNIQUET CUFF) IMPLANT
CUFF TOURNIQUET SINGLE 24IN (TOURNIQUET CUFF) ×2 IMPLANT
DECANTER SPIKE VIAL GLASS SM (MISCELLANEOUS) IMPLANT
DRAPE EXTREMITY T 121X128X90 (DRAPE) ×2 IMPLANT
DRAPE IMP U-DRAPE 54X76 (DRAPES) ×2 IMPLANT
DRAPE OEC MINIVIEW 54X84 (DRAPES) ×2 IMPLANT
DRAPE SURG 17X23 STRL (DRAPES) ×2 IMPLANT
DURAPREP 26ML APPLICATOR (WOUND CARE) ×2 IMPLANT
GAUZE SPONGE 4X4 12PLY STRL (GAUZE/BANDAGES/DRESSINGS) ×2 IMPLANT
GAUZE SPONGE 4X4 12PLY STRL LF (GAUZE/BANDAGES/DRESSINGS) ×2 IMPLANT
GLOVE BIO SURGEON STRL SZ8 (GLOVE) ×2 IMPLANT
GLOVE BIOGEL PI IND STRL 7.0 (GLOVE) ×3 IMPLANT
GLOVE BIOGEL PI IND STRL 8 (GLOVE) ×2 IMPLANT
GLOVE BIOGEL PI INDICATOR 7.0 (GLOVE) ×3
GLOVE BIOGEL PI INDICATOR 8 (GLOVE) ×2
GLOVE ECLIPSE 6.5 STRL STRAW (GLOVE) ×2 IMPLANT
GLOVE ORTHO TXT STRL SZ7.5 (GLOVE) ×2 IMPLANT
GLOVE SURG SS PI 7.0 STRL IVOR (GLOVE) ×2 IMPLANT
GOWN STRL REUS W/ TWL LRG LVL3 (GOWN DISPOSABLE) ×1 IMPLANT
GOWN STRL REUS W/ TWL XL LVL3 (GOWN DISPOSABLE) ×2 IMPLANT
GOWN STRL REUS W/TWL LRG LVL3 (GOWN DISPOSABLE) ×1
GOWN STRL REUS W/TWL XL LVL3 (GOWN DISPOSABLE) ×2
K-WIRE 1.6 (WIRE) ×2
K-WIRE FX5X1.6XNS BN SS (WIRE) ×2
KWIRE FX5X1.6XNS BN SS (WIRE) ×2 IMPLANT
NEEDLE HYPO 25X1 1.5 SAFETY (NEEDLE) IMPLANT
NS IRRIG 1000ML POUR BTL (IV SOLUTION) ×2 IMPLANT
PACK BASIN DAY SURGERY FS (CUSTOM PROCEDURE TRAY) ×2 IMPLANT
PAD CAST 3X4 CTTN HI CHSV (CAST SUPPLIES) IMPLANT
PAD CAST 4YDX4 CTTN HI CHSV (CAST SUPPLIES) ×1 IMPLANT
PADDING CAST ABS 4INX4YD NS (CAST SUPPLIES) ×1
PADDING CAST ABS COTTON 4X4 ST (CAST SUPPLIES) ×1 IMPLANT
PADDING CAST COTTON 3X4 STRL (CAST SUPPLIES)
PADDING CAST COTTON 4X4 STRL (CAST SUPPLIES) ×1
PEG SUBCHONDRAL SMOOTH 2.0X16 (Peg) ×2 IMPLANT
PEG SUBCHONDRAL SMOOTH 2.0X18 (Peg) ×4 IMPLANT
PEG SUBCHONDRAL SMOOTH 2.0X20 (Peg) ×6 IMPLANT
PLATE SHORT 21.6X48.9 NRRW RT (Plate) ×2 IMPLANT
SCREW BN 12X3.5XNS CORT TI (Screw) ×1 IMPLANT
SCREW CORT 3.5X12 (Screw) ×1 IMPLANT
SCREW CORT 3.5X14 LNG (Screw) ×2 IMPLANT
SLEEVE SCD COMPRESS KNEE MED (MISCELLANEOUS) ×2 IMPLANT
SLING ARM XL FOAM STRAP (SOFTGOODS) ×2 IMPLANT
SPLINT PLASTER CAST XFAST 3X15 (CAST SUPPLIES) ×10 IMPLANT
SPLINT PLASTER XTRA FASTSET 3X (CAST SUPPLIES) ×10
STRIP CLOSURE SKIN 1/2X4 (GAUZE/BANDAGES/DRESSINGS) ×2 IMPLANT
SUCTION FRAZIER HANDLE 10FR (MISCELLANEOUS) ×1
SUCTION TUBE FRAZIER 10FR DISP (MISCELLANEOUS) ×1 IMPLANT
SUT ETHILON 3 0 PS 1 (SUTURE) IMPLANT
SUT ETHILON 4 0 PS 2 18 (SUTURE) IMPLANT
SUT MNCRL AB 4-0 PS2 18 (SUTURE) IMPLANT
SUT VIC AB 0 CT1 27 (SUTURE)
SUT VIC AB 0 CT1 27XBRD ANBCTR (SUTURE) IMPLANT
SUT VICRYL 3-0 CR8 SH (SUTURE) ×2 IMPLANT
SYR BULB 3OZ (MISCELLANEOUS) ×2 IMPLANT
SYR CONTROL 10ML LL (SYRINGE) IMPLANT
TOWEL OR 17X24 6PK STRL BLUE (TOWEL DISPOSABLE) ×2 IMPLANT
TOWEL OR NON WOVEN STRL DISP B (DISPOSABLE) ×2 IMPLANT
TUBE CONNECTING 20X1/4 (TUBING) ×2 IMPLANT
UNDERPAD 30X30 (UNDERPADS AND DIAPERS) IMPLANT

## 2016-10-18 NOTE — Progress Notes (Signed)
Assisted Dr. Ossey with right, ultrasound guided, interscalene  block. Side rails up, monitors on throughout procedure. See vital signs in flow sheet. Tolerated Procedure well. 

## 2016-10-18 NOTE — Anesthesia Preprocedure Evaluation (Addendum)
Anesthesia Evaluation  Patient identified by MRN, date of birth, ID band Patient awake    Reviewed: Allergy & Precautions, NPO status , Patient's Chart, lab work & pertinent test results  Airway Mallampati: I  TM Distance: >3 FB Neck ROM: Full    Dental   Pulmonary    Pulmonary exam normal        Cardiovascular hypertension, Pt. on medications Normal cardiovascular exam     Neuro/Psych    GI/Hepatic   Endo/Other    Renal/GU Renal diseaseH/O renal transplant     Musculoskeletal   Abdominal   Peds  Hematology   Anesthesia Other Findings   Reproductive/Obstetrics                            Anesthesia Physical Anesthesia Plan  ASA: III  Anesthesia Plan: Regional and MAC   Post-op Pain Management:    Induction: Intravenous  Airway Management Planned: Simple Face Mask  Additional Equipment:   Intra-op Plan:   Post-operative Plan:   Informed Consent: I have reviewed the patients History and Physical, chart, labs and discussed the procedure including the risks, benefits and alternatives for the proposed anesthesia with the patient or authorized representative who has indicated his/her understanding and acceptance.     Plan Discussed with: CRNA and Surgeon  Anesthesia Plan Comments:         Anesthesia Quick Evaluation

## 2016-10-18 NOTE — Anesthesia Postprocedure Evaluation (Signed)
Anesthesia Post Note  Patient: [name]  Procedure(s) Performed: Procedure(s) (LRB): OPEN REDUCTION INTERNAL FIXATION (ORIF) RIGHT DISTAL RADIAL FRACTURE (Right)  Patient location during evaluation: PACU Anesthesia Type: Regional Level of consciousness: awake and alert and patient cooperative Pain management: pain level controlled Vital Signs Assessment: post-procedure vital signs reviewed and stable Respiratory status: spontaneous breathing and respiratory function stable Cardiovascular status: stable Anesthetic complications: no       Last Vitals:  Vitals:   10/18/16 0852 10/18/16 0942  BP:  (!) 177/80  Pulse: 69 68  Resp: 12 18  Temp:  37 C    Last Pain:  Vitals:   10/18/16 0851  TempSrc:   PainSc: 0-No pain                 Kayce Betty DAVID

## 2016-10-18 NOTE — Anesthesia Procedure Notes (Signed)
Anesthesia Regional Block: Supraclavicular block   Pre-Anesthetic Checklist: ,, timeout performed, Correct Patient, Correct Site, Correct Laterality, Correct Procedure, Correct Position, site marked, Risks and benefits discussed,  Surgical consent,  Pre-op evaluation,  At surgeon's request and post-op pain management  Laterality: Right  Prep: chloraprep       Needles:   Needle Type: Echogenic Stimulator Needle     Needle Length: 9cm  Needle Gauge: 21     Additional Needles:   Procedures: ultrasound guided, nerve stimulator,,,,,,   Nerve Stimulator or Paresthesia:  Response: 0.4 mA,   Additional Responses:   Narrative:  Start time: 10/18/2016 7:20 AM End time: 10/18/2016 7:30 AM Injection made incrementally with aspirations every 5 mL.  Performed by: Personally  Anesthesiologist: Lillia Abed  Additional Notes: Monitors applied. Patient sedated. Sterile prep and drape,hand hygiene and sterile gloves were used. Relevant anatomy identified.Needle position confirmed.Local anesthetic injected incrementally after negative aspiration. Local anesthetic spread visualized around nerve(s). Vascular puncture avoided. No complications. Image printed for medical record.The patient tolerated the procedure well.

## 2016-10-18 NOTE — Op Note (Signed)
10/18/2016  8:36 AM  PATIENT:  [name]    PRE-OPERATIVE DIAGNOSIS:  Extra-articular distal radius fracture  POST-OPERATIVE DIAGNOSIS:  Same  PROCEDURE:  ORIF DISTAL RADIUS FRACTURE, 2 PIECES  SURGEON:  Johnny Bridge, MD  PHYSICIAN ASSISTANT: Joya Gaskins, OPA-C, present and scrubbed throughout the case, critical for completion in a timely fashion, and for retraction, instrumentation, and closure.  ANESTHESIA:   regional  ESTIMATED BLOOD LOSS: minimal  PREOPERATIVE INDICATIONS:  [name] is a  71 y.o. female with a diagnosis of displaced right extra-articular distal radius fracture who elected for surgical management due to fracture displacement.  She had significant displacement, with shortening and angulation. She has multiple other risk factors, including history of kidney transplant. We discussed the options of closed reduction versus open reduction internal fixation and she elected surgical management.  The risks benefits and alternatives were discussed with the patient preoperatively including but not limited to the risks of infection, bleeding, nerve injury, cardiopulmonary complications, the need for revision surgery, tendon rupture, hardware prominence, hardware failure, nonunion, malunion, post-traumatic arthritis, regional pain syndrome, among others, and the patient was willing to proceed.  OPERATIVE IMPLANTS: Biomet DVR volar plate with 2 proximal cortical screws and multiple distal interlocking smooth pegs, using the short narrow plate.   OPERATIVE FINDINGS: Significant displacement of the distal radius fracture, however the distal block was primarily one piece.  UNIQUE ASPECTS OF THE CASE:  I was able to achieve anatomic reduction, although the distal head of the plate was slightly radial, but it was all contained within the bone, and in fact I was able to feel the endpoint of all of the pegs contained within the bone.  OPERATIVE PROCEDURE: The patient was  brought to the operating room and placed in the supine position. Regional anesthesia was administered. IV antibiotics were given. Time out was performed. The upper extremity was prepped and draped in usual sterile fashion. The arm was elevated and exsanguinated and the tourniquet was inflated at 268mm hg.    Volar approach to the distal radius was carried out, and the flexor carpi radialis was retracted radially. The radial artery was protected throughout the case.   Deep dissection was carried down, and the pronator quadratus was elevated off of the radius. The fracture site was identified and cleaned and reduced anatomically. This keyed into place nicely.   I held this provisionally with a K wire, and C-arm used to confirm alignment.   I had restored height and inclination and then applied a volar plate. A K wire was used to confirm appropriate position of the plate, and once I was satisfied with the overall alignment I was able to secure the plate proximally with a cortical screw.   I then secured the fracture with multiple smooth interlocking pegs distally, and confirmed that none of these were in the joint, and none of these were penetrating the dorsal cortex. I also secured the plate proximally with one more cortical screw. On the ulnar side of the plate distally I used shorter pegs than on the radial side.    The wounds were irrigated copiously, and I used a 3-0 subcutaneous Vicryl for the skin and Steri-Strips and sterile gauze and a volar splint. The tourniquet was released. She was awakened and returned back in stable and satisfactory condition. There were no complications and She tolerated the procedure well.

## 2016-10-18 NOTE — Anesthesia Procedure Notes (Signed)
Procedure Name: MAC Date/Time: 10/18/2016 7:52 AM Performed by: Marrianne Mood Pre-anesthesia Checklist: Patient identified, Timeout performed, Emergency Drugs available, Suction available and Patient being monitored Patient Re-evaluated:Patient Re-evaluated prior to inductionOxygen Delivery Method: Simple face mask Preoxygenation: Pre-oxygenation with 100% oxygen

## 2016-10-18 NOTE — H&P (Signed)
PREOPERATIVE H&P  Chief Complaint: Unspecified fracture of the lower end of the right radius  S52.501  HPI: [name] is a 71 y.o. female who presents for preoperative history and physical with a diagnosis of Unspecified fracture of the lower end of the right radius  S52.501. Symptoms are rated as moderate to severe, and have been worsening.  This is significantly impairing activities of daily living.  She has elected for surgical management.   This happened after a fall, and she has had loss of function and pain.  Past Medical History:  Diagnosis Date  . Anemia   . Arthritis    HIP  . Asthma    as a child  . Blood transfusion    never had a reaction to blood transfusion  . Cardiomyopathy Mar 2013   Mild, EF 50-55% by Mar 2013 ECHO, diast dysfxn II  . Constipation    takes Miralax daily  . Depression    takes Zoloft daily  . Distal radius fracture, right   . DVT of lower extremity, bilateral ([place]) 12/21/11   "they're there now; been there for 2 wks"  . Eczema   . ESRD (end stage renal disease) on dialysis [facility]) 09/2011   07-19-2015 had Kidney transplant at [facility]  . Fractures, stress    in both feet--6 OR 7 YRS AGO--RESOLVED  . Gout    doesn't require meds   . HCAP (healthcare-associated pneumonia)    09/10/11  . Hypercalcemia    09/10/11  . Hypertension    takes Diltiazem daily   . Morbid obesity ([place])   . Nonischemic dilated cardiomyopathy ([place])   . Oligouria   . PAF (paroxysmal atrial fibrillation) ([facility])     HX OF CEREBRAL BLEED WHILE ON COUMADIN-SO PT NOT ON ANY BLOOD THINNERS NOW  . Peripheral vascular disease ([place])   . Sleep apnea    uses CPAP nightly  . Stroke [facility]) 2009   denies residual, hemorrhagic now off coumadin  . Stroke due to intracerebral hemorrhage ([place]) 2009   Past Surgical History:  Procedure Laterality Date  . AV FISTULA PLACEMENT  09/28/2011   Procedure: ARTERIOVENOUS (AV) FISTULA CREATION;  Surgeon: Rosetta Posner, MD;  Location: [place];   Service: Vascular;  Laterality: Left;  . CARDIAC VALVE SURGERY  1960    FOR ATRIAL SEPTAL DEFECT  . CHOLECYSTECTOMY     1980's  . COLONOSCOPY    . CYSTOSCOPY     many yrs ago  . FEMUR FRACTURE SURGERY  03/2011; 09/03/2011   right; "had 2, 2 wk apart in 2012; broke it again 08/2011 & had OR"  . I&D EXTREMITY  09/15/2011   Procedure: IRRIGATION AND DEBRIDEMENT EXTREMITY w REMOVAL OF HARDWARE;  Surgeon: [name], MD;  Location: [place];  Service: Orthopedics;  Laterality: Right;  . INCISION AND DRAINAGE HIP  12/21/2011   Procedure: IRRIGATION AND DEBRIDEMENT HIP;  Surgeon: [name], MD;  Location: [place];  Service: Orthopedics;  Laterality: Right;  I&D RIGHT HIP WITH PLACEMENT ANTIBIOTIC SPACER  . INSERTION OF DIALYSIS CATHETER     Procedure: INSERTION OF DIALYSIS CATHETER;  Surgeon: Rosetta Posner, MD;  Location: [place];  Service: Vascular;  Laterality: Right;  . IRRIGATION AND DEBRIDEMENT ABSCESS  12/21/11   right hip  . JOINT REPLACEMENT    . KIDNEY TRANSPLANT  07/19/15  . LAPAROSCOPIC GASTRIC BANDING    . TOTAL HIP ARTHROPLASTY  02/2011   right THA 02/2011, I&D/removal of hardware  09/2011,, repeat I&D Jun 2013, reimplantation R THA 03-26-2012  . TOTAL HIP REVISION  03/25/2012   Procedure: TOTAL HIP REVISION;  Surgeon: [name], MD;  Location: [facility];  Service: Orthopedics;  Laterality: Right;  Right Total Hip Reimplantation  . VENA CAVA FILTER PLACEMENT  11/2011   Social History   Social History  . Marital status: Married    Spouse name: N/A  . Number of children: N/A  . Years of education: N/A   Social History Main Topics  . Smoking status: Never Smoker  . Smokeless tobacco: Never Used  . Alcohol use No  . Drug use: No  . Sexual activity: No   Other Topics Concern  . None   Social History Narrative  . None   Family History  Problem Relation Age of Onset  . Cancer Father   . Diabetes Mother   . Hypertension Mother   . Hodgkin's lymphoma  32    dscd---[facility] DISE[facility]    Allergies  Allergen Reactions  . Ace Inhibitors Other (See Comments)    Reaction unknown  . Warfarin Sodium Other (See Comments)    Caused her to have a stroke   Prior to Admission medications   Medication Sig Start Date End Date Taking? Authorizing Provider  acetaminophen (TYLENOL) 500 MG tablet Take 1,000 mg by mouth every 6 (six) hours as needed (For pain.).    Yes Historical Provider, MD  aspirin EC 81 MG tablet Take 81 mg by mouth. 07/21/15  Yes Historical Provider, MD  beclomethasone (QVAR) 40 MCG/ACT inhaler Inhale 2 puffs into the lungs 2 (two) times daily. 05/08/16  Yes Yvonne R Lowne Chase, DO  diltiazem (CARDIZEM CD) 240 MG 24 hr capsule Take 1 capsule (240 mg total) by mouth daily after breakfast. 09/13/15  Yes Alferd Apa Lowne Chase, DO  furosemide (LASIX) 20 MG tablet Take 20 mg by mouth daily. 05/17/16  Yes Historical Provider, MD  metoprolol (LOPRESSOR) 50 MG tablet Take 50 mg by mouth 2 (two) times daily.   Yes Historical Provider, MD  mycophenolate (MYFORTIC) 180 MG EC tablet Take 180 mg by mouth 2 (two) times daily.   Yes Historical Provider, MD  predniSONE (DELTASONE) 5 MG tablet Take 5 mg by mouth daily with breakfast.   Yes Historical Provider, MD  sertraline (ZOLOFT) 50 MG tablet 1 TAB BY MOUTH DAILY 10/15/16  Yes Yvonne R Lowne Chase, DO  sulfamethoxazole-trimethoprim (BACTRIM) 400-80 MG tablet Take 1 tablet by mouth 3 (three) times a week.   Yes Historical Provider, MD  Tacrolimus (ENVARSUS XR) 1 MG TB24 Take by mouth.   Yes Historical Provider, MD  VENTOLIN HFA 108 (90 Base) MCG/ACT inhaler INHALE 2 PUFFS INTO THE LUNGS EVERY 6 (SIX) HOURS AS NEEDED FOR WHEEZING OR SHORTNESS OF BREATH. 07/17/16  Yes Yvonne R Lowne Chase, DO  docusate sodium (COLACE) 100 MG capsule Take 100 mg by mouth daily as needed (For constipation.).     Historical Provider, MD     Positive ROS: All other systems have been reviewed and were otherwise negative with the exception of those mentioned in  the HPI and as above.  Physical Exam: General: Alert, no acute distress Cardiovascular: No pedal edema Respiratory: No cyanosis, no use of accessory musculature GI: No organomegaly, abdomen is soft and non-tender Skin: No lesions in the area of chief complaint Neurologic: Sensation intact distally Psychiatric: Patient is competent for consent with normal mood and affect Lymphatic: No axillary or cervical lymphadenopathy  MUSCULOSKELETAL: sensation  intact in hand, positive deformity and pain to palpation right wrist.  Assessment: Unspecified fracture of the lower end of the right radius  S52.501   Plan: Plan for Procedure(s): OPEN REDUCTION INTERNAL FIXATION (ORIF) RIGHT DISTAL RADIAL FRACTURE  The risks benefits and alternatives were discussed with the patient including but not limited to the risks of nonoperative treatment, versus surgical intervention including infection, bleeding, nerve injury, malunion, nonunion, the need for revision surgery, hardware prominence, hardware failure, the need for hardware removal, blood clots, cardiopulmonary complications, morbidity, mortality, among others, and they were willing to proceed.    Johnny Bridge, MD Cell [phone]   10/18/2016 7:33 AM

## 2016-10-18 NOTE — Transfer of Care (Signed)
Immediate Anesthesia Transfer of Care Note  Patient: [name]  Procedure(s) Performed: Procedure(s): OPEN REDUCTION INTERNAL FIXATION (ORIF) RIGHT DISTAL RADIAL FRACTURE (Right)  Patient Location: PACU  Anesthesia Type:MAC combined with regional for post-op pain  Level of Consciousness: awake, alert  and patient cooperative  Airway & Oxygen Therapy: Patient Spontanous Breathing and Patient connected to face mask oxygen  Post-op Assessment: Report given to RN and Post -op Vital signs reviewed and stable  Post vital signs: Reviewed and stable  Last Vitals:  Vitals:   10/18/16 0851 10/18/16 0852  BP: (!) 164/73   Pulse: 66 69  Resp:  12  Temp:      Last Pain:  Vitals:   10/18/16 0645  TempSrc: Oral  PainSc: 0-No pain         Complications: No apparent anesthesia complications

## 2016-10-18 NOTE — Discharge Instructions (Signed)
Diet: As you were doing prior to hospitalization   Shower:  May shower but keep the wounds dry, use an occlusive plastic wrap, NO SOAKING IN TUB.  If the bandage gets wet, change with a clean dry gauze.  If you have a splint on, leave the splint in place and keep the splint dry with a plastic bag.  Dressing:  You may change your dressing 3-5 days after surgery, unless you have a splint.  If you have a splint, then just leave the splint in place and we will change your bandages during your first follow-up appointment.    If you had hand or foot surgery, we will plan to remove your stitches in about 2 weeks in the office.  For all other surgeries, there are sticky tapes (steri-strips) on your wounds and all the stitches are absorbable.  Leave the steri-strips in place when changing your dressings, they will peel off with time, usually 2-3 weeks.  Activity:  Increase activity slowly as tolerated, but follow the weight bearing instructions below.  The rules on driving is that you can not be taking narcotics while you drive, and you must feel in control of the vehicle.    Weight Bearing:   No lifting with right hand.    To prevent constipation: you may use a stool softener such as -  Colace (over the counter) 100 mg by mouth twice a day  Drink plenty of fluids (prune juice may be helpful) and high fiber foods Miralax (over the counter) for constipation as needed.    Itching:  If you experience itching with your medications, try taking only a single pain pill, or even half a pain pill at a time.  You may take up to 10 pain pills per day, and you can also use benadryl over the counter for itching or also to help with sleep.   Precautions:  If you experience chest pain or shortness of breath - call 911 immediately for transfer to the hospital emergency department!!  If you develop a fever greater that 101 F, purulent drainage from wound, increased redness or drainage from wound, or calf pain -- Call  the office at [phone]                                                Follow- Up Appointment:  Please call for an appointment to be seen in 2 weeks [place] - [phone]    Post Anesthesia Home Care Instructions  Activity: Get plenty of rest for the remainder of the day. A responsible individual must stay with you for 24 hours following the procedure.  For the next 24 hours, DO NOT: -Drive a car -Paediatric nurse -Drink alcoholic beverages -Take any medication unless instructed by your physician -Make any legal decisions or sign important papers.  Meals: Start with liquid foods such as gelatin or soup. Progress to regular foods as tolerated. Avoid greasy, spicy, heavy foods. If nausea and/or vomiting occur, drink only clear liquids until the nausea and/or vomiting subsides. Call your physician if vomiting continues.  Special Instructions/Symptoms: Your throat may feel dry or sore from the anesthesia or the breathing tube placed in your throat during surgery. If this causes discomfort, gargle with warm salt water. The discomfort should disappear within 24 hours.  If you had a scopolamine patch placed behind your ear for the  management of post- operative nausea and/or vomiting:  1. The medication in the patch is effective for 72 hours, after which it should be removed.  Wrap patch in a tissue and discard in the trash. Wash hands thoroughly with soap and water. 2. You may remove the patch earlier than 72 hours if you experience unpleasant side effects which may include dry mouth, dizziness or visual disturbances. 3. Avoid touching the patch. Wash your hands with soap and water after contact with the patch.     Regional Anesthesia Blocks  1. Numbness or the inability to move the "blocked" extremity may last from 3-48 hours after placement. The length of time depends on the medication injected and your individual response to the medication. If the numbness is not going away after  48 hours, call your surgeon.  2. The extremity that is blocked will need to be protected until the numbness is gone and the  Strength has returned. Because you cannot feel it, you will need to take extra care to avoid injury. Because it may be weak, you may have difficulty moving it or using it. You may not know what position it is in without looking at it while the block is in effect.  3. For blocks in the legs and feet, returning to weight bearing and walking needs to be done carefully. You will need to wait until the numbness is entirely gone and the strength has returned. You should be able to move your leg and foot normally before you try and bear weight or walk. You will need someone to be with you when you first try to ensure you do not fall and possibly risk injury.  4. Bruising and tenderness at the needle site are common side effects and will resolve in a few days.  5. Persistent numbness or new problems with movement should be communicated to the surgeon or the [place] [phone] [place] [phone]).

## 2016-10-19 ENCOUNTER — Encounter (HOSPITAL_BASED_OUTPATIENT_CLINIC_OR_DEPARTMENT_OTHER): Payer: Self-pay | Admitting: Orthopedic Surgery

## 2016-10-29 DIAGNOSIS — S0083XA Contusion of other part of head, initial encounter: Secondary | ICD-10-CM | POA: Diagnosis not present

## 2016-10-29 DIAGNOSIS — H1132 Conjunctival hemorrhage, left eye: Secondary | ICD-10-CM | POA: Diagnosis not present

## 2016-10-31 DIAGNOSIS — S52501D Unspecified fracture of the lower end of right radius, subsequent encounter for closed fracture with routine healing: Secondary | ICD-10-CM | POA: Diagnosis not present

## 2016-11-06 DIAGNOSIS — N390 Urinary tract infection, site not specified: Secondary | ICD-10-CM | POA: Diagnosis not present

## 2016-11-06 DIAGNOSIS — I4891 Unspecified atrial fibrillation: Secondary | ICD-10-CM | POA: Diagnosis not present

## 2016-11-06 DIAGNOSIS — Z940 Kidney transplant status: Secondary | ICD-10-CM | POA: Diagnosis not present

## 2016-11-06 DIAGNOSIS — I770 Arteriovenous fistula, acquired: Secondary | ICD-10-CM | POA: Diagnosis not present

## 2016-11-06 DIAGNOSIS — R809 Proteinuria, unspecified: Secondary | ICD-10-CM | POA: Diagnosis not present

## 2016-11-06 DIAGNOSIS — I619 Nontraumatic intracerebral hemorrhage, unspecified: Secondary | ICD-10-CM | POA: Diagnosis not present

## 2016-11-06 DIAGNOSIS — N182 Chronic kidney disease, stage 2 (mild): Secondary | ICD-10-CM | POA: Diagnosis not present

## 2016-11-06 DIAGNOSIS — D899 Disorder involving the immune mechanism, unspecified: Secondary | ICD-10-CM | POA: Diagnosis not present

## 2016-11-06 DIAGNOSIS — M109 Gout, unspecified: Secondary | ICD-10-CM | POA: Diagnosis not present

## 2016-11-06 DIAGNOSIS — Z79899 Other long term (current) drug therapy: Secondary | ICD-10-CM | POA: Diagnosis not present

## 2016-11-06 DIAGNOSIS — I129 Hypertensive chronic kidney disease with stage 1 through stage 4 chronic kidney disease, or unspecified chronic kidney disease: Secondary | ICD-10-CM | POA: Diagnosis not present

## 2016-11-06 DIAGNOSIS — N2581 Secondary hyperparathyroidism of renal origin: Secondary | ICD-10-CM | POA: Diagnosis not present

## 2016-11-06 DIAGNOSIS — E785 Hyperlipidemia, unspecified: Secondary | ICD-10-CM | POA: Diagnosis not present

## 2016-11-15 DIAGNOSIS — Z940 Kidney transplant status: Secondary | ICD-10-CM | POA: Diagnosis not present

## 2016-11-22 DIAGNOSIS — N390 Urinary tract infection, site not specified: Secondary | ICD-10-CM | POA: Diagnosis not present

## 2016-11-28 DIAGNOSIS — S52501D Unspecified fracture of the lower end of right radius, subsequent encounter for closed fracture with routine healing: Secondary | ICD-10-CM | POA: Diagnosis not present

## 2016-12-26 DIAGNOSIS — S52501D Unspecified fracture of the lower end of right radius, subsequent encounter for closed fracture with routine healing: Secondary | ICD-10-CM | POA: Diagnosis not present

## 2017-01-08 DIAGNOSIS — I770 Arteriovenous fistula, acquired: Secondary | ICD-10-CM | POA: Diagnosis not present

## 2017-01-08 DIAGNOSIS — I129 Hypertensive chronic kidney disease with stage 1 through stage 4 chronic kidney disease, or unspecified chronic kidney disease: Secondary | ICD-10-CM | POA: Diagnosis not present

## 2017-01-08 DIAGNOSIS — N182 Chronic kidney disease, stage 2 (mild): Secondary | ICD-10-CM | POA: Diagnosis not present

## 2017-01-08 DIAGNOSIS — R809 Proteinuria, unspecified: Secondary | ICD-10-CM | POA: Diagnosis not present

## 2017-01-08 DIAGNOSIS — Z940 Kidney transplant status: Secondary | ICD-10-CM | POA: Diagnosis not present

## 2017-01-08 DIAGNOSIS — Z79899 Other long term (current) drug therapy: Secondary | ICD-10-CM | POA: Diagnosis not present

## 2017-01-08 DIAGNOSIS — D899 Disorder involving the immune mechanism, unspecified: Secondary | ICD-10-CM | POA: Diagnosis not present

## 2017-01-08 DIAGNOSIS — N2581 Secondary hyperparathyroidism of renal origin: Secondary | ICD-10-CM | POA: Diagnosis not present

## 2017-01-08 DIAGNOSIS — Z6839 Body mass index (BMI) 39.0-39.9, adult: Secondary | ICD-10-CM | POA: Diagnosis not present

## 2017-01-08 DIAGNOSIS — G4733 Obstructive sleep apnea (adult) (pediatric): Secondary | ICD-10-CM | POA: Diagnosis not present

## 2017-01-08 DIAGNOSIS — M109 Gout, unspecified: Secondary | ICD-10-CM | POA: Diagnosis not present

## 2017-01-10 DIAGNOSIS — Z940 Kidney transplant status: Secondary | ICD-10-CM | POA: Diagnosis not present

## 2017-01-10 DIAGNOSIS — N390 Urinary tract infection, site not specified: Secondary | ICD-10-CM | POA: Diagnosis not present

## 2017-01-10 DIAGNOSIS — N182 Chronic kidney disease, stage 2 (mild): Secondary | ICD-10-CM | POA: Diagnosis not present

## 2017-01-17 DIAGNOSIS — Z940 Kidney transplant status: Secondary | ICD-10-CM | POA: Diagnosis not present

## 2017-01-17 DIAGNOSIS — N182 Chronic kidney disease, stage 2 (mild): Secondary | ICD-10-CM | POA: Diagnosis not present

## 2017-01-24 DIAGNOSIS — I129 Hypertensive chronic kidney disease with stage 1 through stage 4 chronic kidney disease, or unspecified chronic kidney disease: Secondary | ICD-10-CM | POA: Diagnosis not present

## 2017-03-11 DIAGNOSIS — M109 Gout, unspecified: Secondary | ICD-10-CM | POA: Diagnosis not present

## 2017-03-11 DIAGNOSIS — I770 Arteriovenous fistula, acquired: Secondary | ICD-10-CM | POA: Diagnosis not present

## 2017-03-11 DIAGNOSIS — D899 Disorder involving the immune mechanism, unspecified: Secondary | ICD-10-CM | POA: Diagnosis not present

## 2017-03-11 DIAGNOSIS — Z79899 Other long term (current) drug therapy: Secondary | ICD-10-CM | POA: Diagnosis not present

## 2017-03-11 DIAGNOSIS — E785 Hyperlipidemia, unspecified: Secondary | ICD-10-CM | POA: Diagnosis not present

## 2017-03-11 DIAGNOSIS — N182 Chronic kidney disease, stage 2 (mild): Secondary | ICD-10-CM | POA: Diagnosis not present

## 2017-03-11 DIAGNOSIS — N2581 Secondary hyperparathyroidism of renal origin: Secondary | ICD-10-CM | POA: Diagnosis not present

## 2017-03-11 DIAGNOSIS — Z6841 Body Mass Index (BMI) 40.0 and over, adult: Secondary | ICD-10-CM | POA: Diagnosis not present

## 2017-03-11 DIAGNOSIS — Z940 Kidney transplant status: Secondary | ICD-10-CM | POA: Diagnosis not present

## 2017-03-11 DIAGNOSIS — N390 Urinary tract infection, site not specified: Secondary | ICD-10-CM | POA: Diagnosis not present

## 2017-03-11 DIAGNOSIS — I129 Hypertensive chronic kidney disease with stage 1 through stage 4 chronic kidney disease, or unspecified chronic kidney disease: Secondary | ICD-10-CM | POA: Diagnosis not present

## 2017-03-11 DIAGNOSIS — R809 Proteinuria, unspecified: Secondary | ICD-10-CM | POA: Diagnosis not present

## 2017-03-25 DIAGNOSIS — Z940 Kidney transplant status: Secondary | ICD-10-CM | POA: Diagnosis not present

## 2017-04-01 ENCOUNTER — Ambulatory Visit (INDEPENDENT_AMBULATORY_CARE_PROVIDER_SITE_OTHER): Payer: Medicare Other | Admitting: Family Medicine

## 2017-04-01 ENCOUNTER — Encounter: Payer: Self-pay | Admitting: Family Medicine

## 2017-04-01 ENCOUNTER — Telehealth: Payer: Self-pay | Admitting: Family Medicine

## 2017-04-01 VITALS — BP 114/78 | HR 103 | Temp 98.1°F | Ht 64.0 in | Wt 244.0 lb

## 2017-04-01 DIAGNOSIS — J4521 Mild intermittent asthma with (acute) exacerbation: Secondary | ICD-10-CM | POA: Diagnosis not present

## 2017-04-01 DIAGNOSIS — J45901 Unspecified asthma with (acute) exacerbation: Secondary | ICD-10-CM

## 2017-04-01 MED ORDER — ALBUTEROL SULFATE (2.5 MG/3ML) 0.083% IN NEBU: 2.5000 mg | INHALATION_SOLUTION | Freq: Four times a day (QID) | RESPIRATORY_TRACT | 12 refills | Status: DC | PRN

## 2017-04-01 MED ORDER — ALBUTEROL SULFATE (2.5 MG/3ML) 0.083% IN NEBU
2.5000 mg | INHALATION_SOLUTION | Freq: Once | RESPIRATORY_TRACT | Status: AC
  Administered 2017-04-01: 2.5 mg via RESPIRATORY_TRACT

## 2017-04-01 MED ORDER — BECLOMETHASONE DIPROPIONATE 40 MCG/ACT IN AERS: 2.0000 | INHALATION_SPRAY | Freq: Two times a day (BID) | RESPIRATORY_TRACT | 12 refills | Status: DC

## 2017-04-01 MED ORDER — METHYLPREDNISOLONE ACETATE 40 MG/ML IJ SUSP
40.0000 mg | Freq: Once | INTRAMUSCULAR | Status: AC
  Administered 2017-04-01: 40 mg via INTRAMUSCULAR

## 2017-04-01 MED ORDER — ALBUTEROL SULFATE HFA 108 (90 BASE) MCG/ACT IN AERS: 2.0000 | INHALATION_SPRAY | Freq: Four times a day (QID) | RESPIRATORY_TRACT | 0 refills | Status: DC | PRN

## 2017-04-01 NOTE — Addendum Note (Signed)
Addended by: Benson Setting L on: 04/01/2017 02:31 PM   Modules accepted: Orders

## 2017-04-01 NOTE — Progress Notes (Signed)
Patient ID: [name], female    DOB: 22-May-1946  Age: 71 y.o. MRN: [id number]    Subjective:  Subjective  HPI [name] presents for c/o exacerbation asthma --- symptoms started Saturday am.  No fever.   Review of Systems  Constitutional: Negative for chills and fever.  HENT: Positive for congestion. Negative for postnasal drip, rhinorrhea and sinus pressure.   Respiratory: Positive for cough, chest tightness, shortness of breath and wheezing.   Cardiovascular: Negative for chest pain, palpitations and leg swelling.  Allergic/Immunologic: Negative for environmental allergies.    History Past Medical History:  Diagnosis Date  . Anemia   . Arthritis    HIP  . Asthma    as a child  . Blood transfusion    never had a reaction to blood transfusion  . Cardiomyopathy Mar 2013   Mild, EF 50-55% by Mar 2013 ECHO, diast dysfxn II  . Closed fracture of right distal radius 10/18/2016  . Constipation    takes Miralax daily  . Depression    takes Zoloft daily  . Distal radius fracture, right   . DVT of lower extremity, bilateral ([place]) 12/21/11   "they're there now; been there for 2 wks"  . Eczema   . ESRD (end stage renal disease) on dialysis [facility]) 09/2011   07-19-2015 had Kidney transplant at [facility]  . Fractures, stress    in both feet--6 OR 7 YRS AGO--RESOLVED  . Gout    doesn't require meds   . HCAP (healthcare-associated pneumonia)    09/10/11  . Hypercalcemia    09/10/11  . Hypertension    takes Diltiazem daily   . Morbid obesity ([place])   . Nonischemic dilated cardiomyopathy ([place])   . Oligouria   . PAF (paroxysmal atrial fibrillation) (HCC)     HX OF CEREBRAL BLEED WHILE ON COUMADIN-SO PT NOT ON ANY BLOOD THINNERS NOW  . Peripheral vascular disease ([place])   . Sleep apnea    uses CPAP nightly  . Stroke [facility]) 2009   denies residual, hemorrhagic now off coumadin  . Stroke due to intracerebral hemorrhage [facility]) 2009    [name] has a past surgical history that includes  Cardiac valuve replacement (1960); I&D extremity (09/15/2011); AV fistula placement (09/28/2011); Cystoscopy; Colonoscopy; Irrigation and debridement abscess (12/21/11); Vena cava filter placement (11/2011); Total hip arthroplasty (02/2011); Femur fracture surgery (03/2011; 09/03/2011); Insertion of dialysis catheter; Cholecystectomy; Incision and drainage hip (12/21/2011); Total hip revision (03/25/2012); Kidney transplant (07/19/15); Joint replacement; Laparoscopic gastric banding; and Open reduction internal fixation (orif) distal radial fracture (Right, 10/18/2016).   Her family history includes Cancer in her father; Diabetes in her mother; Hodgkin's lymphoma (age of onset: 34) in her unknown relative; Hypertension in her mother.[name] reports that [name] has never smoked. [name] has never used smokeless tobacco. [name] reports that [name] does not drink alcohol or use drugs.  Current Outpatient Prescriptions on File Prior to Visit  Medication Sig Dispense Refill  . aspirin EC 81 MG tablet Take 81 mg by mouth.    . diltiazem (CARDIZEM CD) 240 MG 24 hr capsule Take 1 capsule (240 mg total) by mouth daily after breakfast. 90 capsule 0  . docusate sodium (COLACE) 100 MG capsule Take 100 mg by mouth daily as needed (For constipation.).     Marland Kitchen mycophenolate (MYFORTIC) 180 MG EC tablet Take 180 mg by mouth 2 (two) times daily.    . predniSONE (DELTASONE) 5 MG tablet Take 5 mg by mouth daily with breakfast.    .  sertraline (ZOLOFT) 50 MG tablet 1 TAB BY MOUTH DAILY 90 tablet 1  . sulfamethoxazole-trimethoprim (BACTRIM) 400-80 MG tablet Take 1 tablet by mouth 3 (three) times a week.    . Tacrolimus (ENVARSUS XR) 1 MG TB24 Take by mouth.    . furosemide (LASIX) 20 MG tablet Take 20 mg by mouth daily.    Marland Kitchen HYDROcodone-acetaminophen (NORCO) 10-325 MG tablet Take 1-2 tablets by mouth every 6 (six) hours as needed. (Patient not taking: Reported on 04/01/2017) 50 tablet 0  . metoprolol (LOPRESSOR) 50 MG tablet Take 50 mg by mouth 2 (two)  times daily.    . [DISCONTINUED] colchicine 0.6 MG tablet Take 1 tablet (0.6 mg total) by mouth daily. 31 tablet 0  . [DISCONTINUED] gabapentin (NEURONTIN) 300 MG capsule Take 300 mg by mouth 3 (three) times daily.     . [DISCONTINUED] metoprolol (TOPROL XL) 100 MG 24 hr tablet Take 100 mg by mouth daily before breakfast.     . [DISCONTINUED] potassium chloride SA (KLOR-CON M20) 20 MEQ tablet Take 20 mEq by mouth daily.     . [DISCONTINUED] rivaroxaban (XARELTO) 10 MG TABS tablet Take 1 tablet (10 mg total) by mouth daily. 14 tablet 0   No current facility-administered medications on file prior to visit.      Objective:  Objective  Physical Exam  Constitutional: [name] is oriented to person, place, and time. [name] appears well-developed and well-nourished.  HENT:  Right Ear: External ear normal.  Left Ear: External ear normal.  + PND + errythema  Eyes: Conjunctivae are normal. Right eye exhibits no discharge. Left eye exhibits no discharge.  Cardiovascular: Normal rate, regular rhythm and normal heart sounds.   No murmur heard. Pulmonary/Chest: Effort normal. No respiratory distress. [name] has wheezes. [name] has no rales.     [name] exhibits no tenderness.  Musculoskeletal: [name] exhibits no edema.  Lymphadenopathy:    [name] has cervical adenopathy.  Neurological: [name] is alert and oriented to person, place, and time.  Nursing note and vitals reviewed.  BP 114/78 (BP Location: Right Arm, Patient Position: Sitting, Cuff Size: Normal)   Pulse (!) 103   Temp 98.1 F (36.7 C) (Oral)   Ht 5\' 4"  (1.626 m)   Wt 244 lb (110.7 kg)   SpO2 95%   BMI 41.88 kg/m  Wt Readings from Last 3 Encounters:  04/01/17 244 lb (110.7 kg)  10/18/16 232 lb (105.2 kg)  09/18/16 233 lb 3.2 oz (105.8 kg)     Lab Results  Component Value Date   WBC 8.1 03/31/2012   HGB 8.3 (L) 03/31/2012   HCT 26.4 (L) 03/31/2012   PLT 235 03/31/2012   GLUCOSE 95 10/17/2016   CHOL  11/16/2008    184        ATP III  CLASSIFICATION:  <200     mg/dL   Desirable  200-239  mg/dL   Borderline High  >=240    mg/dL   High          TRIG 157 (H) 09/15/2011   HDL 33 (L) 11/16/2008   LDLCALC (H) 11/16/2008    130        Total Cholesterol/HDL:CHD Risk Coronary Heart Disease Risk Table                     Men   Women  1/2 Average Risk   3.4   3.3  Average Risk       5.0   4.4  2  X Average Risk   9.6   7.1  3 X Average Risk  23.4   11.0        Use the calculated Patient Ratio above and the CHD Risk Table to determine the patient's CHD Risk.        ATP III CLASSIFICATION (LDL):  <100     mg/dL   Optimal  100-129  mg/dL   Near or Above                    Optimal  130-159  mg/dL   Borderline  160-189  mg/dL   High  >190     mg/dL   Very High   ALT 15 03/26/2012   AST 21 03/26/2012   NA 141 10/17/2016   K 4.3 10/17/2016   CL 104 10/17/2016   CREATININE 0.87 10/17/2016   BUN 14 10/17/2016   CO2 28 10/17/2016   TSH 2.655 09/11/2011   INR 0.98 03/25/2012   HGBA1C  11/16/2008    5.5 (NOTE) The ADA recommends the following therapeutic goal for glycemic control related to Hgb A1c measurement: Goal of therapy: <6.5 Hgb A1c  Reference: American Diabetes Association: Clinical Practice Recommendations 2010, Diabetes Care, 2010, 33: (Suppl  1).    No results found.   Assessment & Plan:  Plan  I have changed [name]'s VENTOLIN HFA to albuterol. I am also having her start on albuterol. Additionally, I am having her maintain her docusate sodium, aspirin EC, diltiazem, furosemide, sertraline, metoprolol tartrate, Tacrolimus, mycophenolate, predniSONE, sulfamethoxazole-trimethoprim, HYDROcodone-acetaminophen, carvedilol, furosemide, and beclomethasone. We administered albuterol and methylPREDNISolone acetate.  Meds ordered this encounter  Medications  . carvedilol (COREG) 12.5 MG tablet  . furosemide (LASIX) 40 MG tablet    Sig: Take 40 mg by mouth.  Marland Kitchen albuterol (PROVENTIL) (2.5 MG/3ML) 0.083% nebulizer  solution 2.5 mg  . methylPREDNISolone acetate (DEPO-MEDROL) injection 40 mg  . albuterol (PROVENTIL) (2.5 MG/3ML) 0.083% nebulizer solution    Sig: Take 3 mLs (2.5 mg total) by nebulization every 6 (six) hours as needed for wheezing or shortness of breath.    Dispense:  75 mL    Refill:  12  . albuterol (VENTOLIN HFA) 108 (90 Base) MCG/ACT inhaler    Sig: Inhale 2 puffs into the lungs every 6 (six) hours as needed for wheezing or shortness of breath.    Dispense:  18 Inhaler    Refill:  0  . beclomethasone (QVAR) 40 MCG/ACT inhaler    Sig: Inhale 2 puffs into the lungs 2 (two) times daily.    Dispense:  1 Inhaler    Refill:  12    Problem List Items Addressed This Visit      Unprioritized   Exacerbation of asthma - Primary    Depo medrol 40 mg qvar and albuterol  Will have neb sent to pt F/u weeks or sooner prn  Pt instructed to call if symptoms worsen      Relevant Medications   albuterol (PROVENTIL) (2.5 MG/3ML) 0.083% nebulizer solution 2.5 mg (Completed)   methylPREDNISolone acetate (DEPO-MEDROL) injection 40 mg (Completed)   albuterol (PROVENTIL) (2.5 MG/3ML) 0.083% nebulizer solution   albuterol (VENTOLIN HFA) 108 (90 Base) MCG/ACT inhaler   beclomethasone (QVAR) 40 MCG/ACT inhaler    Other Visit Diagnoses    Mild intermittent asthmatic bronchitis with acute exacerbation       Relevant Medications   albuterol (PROVENTIL) (2.5 MG/3ML) 0.083% nebulizer solution 2.5 mg (Completed)   methylPREDNISolone acetate (DEPO-MEDROL) injection  40 mg (Completed)   albuterol (PROVENTIL) (2.5 MG/3ML) 0.083% nebulizer solution   albuterol (VENTOLIN HFA) 108 (90 Base) MCG/ACT inhaler   beclomethasone (QVAR) 40 MCG/ACT inhaler      Follow-up: Return if symptoms worsen or fail to improve.  [name], DO

## 2017-04-01 NOTE — Assessment & Plan Note (Signed)
Depo medrol 40 mg qvar and albuterol  Will have neb sent to pt F/u weeks or sooner prn  Pt instructed to call if symptoms worsen

## 2017-04-01 NOTE — Patient Instructions (Signed)

## 2017-04-01 NOTE — Telephone Encounter (Signed)
Pt says beclomethasone isn't covered by her insurance. She would like to be advised further.    CB: [phone]

## 2017-04-02 MED ORDER — FLUTICASONE PROPIONATE HFA 110 MCG/ACT IN AERO: 2.0000 | INHALATION_SPRAY | Freq: Two times a day (BID) | RESPIRATORY_TRACT | 1 refills | Status: DC

## 2017-04-02 NOTE — Telephone Encounter (Signed)
Relation to pt: self  Call back [place]: [id number] - [place], [address]. [phone] (Phone) [phone] (Fax)     Reason for call:  Patient checking on the status of message below, patient would like to speak with nurse today

## 2017-04-02 NOTE — Telephone Encounter (Signed)
Spoke with pt and made her aware of the medication change, she agreed to try new med and rx was sent to pharmacy. Nothing further is needed

## 2017-04-02 NOTE — Telephone Encounter (Signed)
flovent 110  2 puffs bid #1

## 2017-04-02 NOTE — Telephone Encounter (Signed)
Dr. Etter Sjogren  Per message below, rx is not covered, what would you like to do

## 2017-04-08 NOTE — Telephone Encounter (Signed)
Patient would like to discuss medication mentioned below and her breathing machine states its not helping her asthma, please advise

## 2017-04-14 ENCOUNTER — Other Ambulatory Visit: Payer: Self-pay | Admitting: Family Medicine

## 2017-04-14 DIAGNOSIS — F329 Major depressive disorder, single episode, unspecified: Secondary | ICD-10-CM

## 2017-04-14 DIAGNOSIS — F32A Depression, unspecified: Secondary | ICD-10-CM

## 2017-04-18 ENCOUNTER — Other Ambulatory Visit: Payer: Self-pay | Admitting: Family Medicine

## 2017-04-18 DIAGNOSIS — F329 Major depressive disorder, single episode, unspecified: Secondary | ICD-10-CM

## 2017-04-18 DIAGNOSIS — F32A Depression, unspecified: Secondary | ICD-10-CM

## 2017-04-19 ENCOUNTER — Encounter: Payer: Self-pay | Admitting: Family Medicine

## 2017-04-19 ENCOUNTER — Ambulatory Visit (INDEPENDENT_AMBULATORY_CARE_PROVIDER_SITE_OTHER): Payer: Medicare Other | Admitting: Family Medicine

## 2017-04-19 VITALS — BP 122/78 | HR 87 | Ht 65.0 in | Wt 240.0 lb

## 2017-04-19 DIAGNOSIS — F32A Depression, unspecified: Secondary | ICD-10-CM

## 2017-04-19 DIAGNOSIS — Z23 Encounter for immunization: Secondary | ICD-10-CM | POA: Diagnosis not present

## 2017-04-19 DIAGNOSIS — F329 Major depressive disorder, single episode, unspecified: Secondary | ICD-10-CM

## 2017-04-19 DIAGNOSIS — B353 Tinea pedis: Secondary | ICD-10-CM | POA: Diagnosis not present

## 2017-04-19 DIAGNOSIS — J4521 Mild intermittent asthma with (acute) exacerbation: Secondary | ICD-10-CM | POA: Diagnosis not present

## 2017-04-19 MED ORDER — SERTRALINE HCL 50 MG PO TABS: 50.0000 mg | ORAL_TABLET | Freq: Every day | ORAL | 3 refills | Status: DC

## 2017-04-19 NOTE — Telephone Encounter (Signed)
This was already faxed in/thx dmf

## 2017-04-19 NOTE — Progress Notes (Signed)
Patient ID: [name], female    DOB: 09-17-1945  Age: 71 y.o. MRN: [id number]    Subjective:  Subjective  HPI [name] presents for f/u asthma she is doing better but not 100% -- she stopped the inhalers.  Review of Systems  Constitutional: Negative for appetite change, diaphoresis, fatigue and unexpected weight change.  Eyes: Negative for pain, redness and visual disturbance.  Respiratory: Negative for cough, chest tightness, shortness of breath and wheezing.   Cardiovascular: Negative for chest pain, palpitations and leg swelling.  Endocrine: Negative for cold intolerance, heat intolerance, polydipsia, polyphagia and polyuria.  Genitourinary: Negative for difficulty urinating, dysuria and frequency.  Neurological: Negative for dizziness, light-headedness, numbness and headaches.    History Past Medical History:  Diagnosis Date  . Anemia   . Arthritis    HIP  . Asthma    as a child  . Blood transfusion    never had a reaction to blood transfusion  . Cardiomyopathy Mar 2013   Mild, EF 50-55% by Mar 2013 ECHO, diast dysfxn II  . Closed fracture of right distal radius 10/18/2016  . Constipation    takes Miralax daily  . Depression    takes Zoloft daily  . Distal radius fracture, right   . DVT of lower extremity, bilateral ([place]) 12/21/11   "they're there now; been there for 2 wks"  . Eczema   . ESRD (end stage renal disease) on dialysis [facility]) 09/2011   07-19-2015 had Kidney transplant at [facility]  . Fractures, stress    in both feet--6 OR 7 YRS AGO--RESOLVED  . Gout    doesn't require meds   . HCAP (healthcare-associated pneumonia)    09/10/11  . Hypercalcemia    09/10/11  . Hypertension    takes Diltiazem daily   . Morbid obesity ([place])   . Nonischemic dilated cardiomyopathy ([place])   . Oligouria   . PAF (paroxysmal atrial fibrillation) (HCC)     HX OF CEREBRAL BLEED WHILE ON COUMADIN-SO PT NOT ON ANY BLOOD THINNERS NOW  . Peripheral vascular disease ([place])   .  Sleep apnea    uses CPAP nightly  . Stroke [facility]) 2009   denies residual, hemorrhagic now off coumadin  . Stroke due to intracerebral hemorrhage [facility]) 2009    She has a past surgical history that includes Cardiac valuve replacement (1960); I&D extremity (09/15/2011); AV fistula placement (09/28/2011); Cystoscopy; Colonoscopy; Irrigation and debridement abscess (12/21/11); Vena cava filter placement (11/2011); Total hip arthroplasty (02/2011); Femur fracture surgery (03/2011; 09/03/2011); Insertion of dialysis catheter; Cholecystectomy; Incision and drainage hip (12/21/2011); Total hip revision (03/25/2012); Kidney transplant (07/19/15); Joint replacement; Laparoscopic gastric banding; and Open reduction internal fixation (orif) distal radial fracture (Right, 10/18/2016).   Her family history includes Cancer in her father; Diabetes in her mother; Hodgkin's lymphoma (age of onset: 63) in her unknown relative; Hypertension in her mother.She reports that she has never smoked. She has never used smokeless tobacco. She reports that she does not drink alcohol or use drugs.  Current Outpatient Prescriptions on File Prior to Visit  Medication Sig Dispense Refill  . albuterol (PROVENTIL) (2.5 MG/3ML) 0.083% nebulizer solution Take 3 mLs (2.5 mg total) by nebulization every 6 (six) hours as needed for wheezing or shortness of breath. 75 mL 12  . albuterol (VENTOLIN HFA) 108 (90 Base) MCG/ACT inhaler Inhale 2 puffs into the lungs every 6 (six) hours as needed for wheezing or shortness of breath. 18 Inhaler 0  . aspirin EC 81  MG tablet Take 81 mg by mouth.    . carvedilol (COREG) 12.5 MG tablet     . diltiazem (CARDIZEM CD) 240 MG 24 hr capsule Take 1 capsule (240 mg total) by mouth daily after breakfast. 90 capsule 0  . docusate sodium (COLACE) 100 MG capsule Take 100 mg by mouth daily as needed (For constipation.).     Marland Kitchen fluticasone (FLOVENT HFA) 110 MCG/ACT inhaler Inhale 2 puffs into the lungs 2 (two) times daily. 1  Inhaler 1  . furosemide (LASIX) 20 MG tablet Take 20 mg by mouth daily.    . furosemide (LASIX) 40 MG tablet Take 40 mg by mouth.    Marland Kitchen HYDROcodone-acetaminophen (NORCO) 10-325 MG tablet Take 1-2 tablets by mouth every 6 (six) hours as needed. 50 tablet 0  . metoprolol (LOPRESSOR) 50 MG tablet Take 50 mg by mouth 2 (two) times daily.    . mycophenolate (MYFORTIC) 180 MG EC tablet Take 180 mg by mouth 2 (two) times daily.    . predniSONE (DELTASONE) 5 MG tablet Take 5 mg by mouth daily with breakfast.    . sulfamethoxazole-trimethoprim (BACTRIM) 400-80 MG tablet Take 1 tablet by mouth 3 (three) times a week.    . Tacrolimus (ENVARSUS XR) 1 MG TB24 Take by mouth.    . [DISCONTINUED] colchicine 0.6 MG tablet Take 1 tablet (0.6 mg total) by mouth daily. 31 tablet 0  . [DISCONTINUED] gabapentin (NEURONTIN) 300 MG capsule Take 300 mg by mouth 3 (three) times daily.     . [DISCONTINUED] metoprolol (TOPROL XL) 100 MG 24 hr tablet Take 100 mg by mouth daily before breakfast.     . [DISCONTINUED] potassium chloride SA (KLOR-CON M20) 20 MEQ tablet Take 20 mEq by mouth daily.     . [DISCONTINUED] rivaroxaban (XARELTO) 10 MG TABS tablet Take 1 tablet (10 mg total) by mouth daily. 14 tablet 0   No current facility-administered medications on file prior to visit.      Objective:  Objective  Physical Exam  Constitutional: She is oriented to person, place, and time. She appears well-developed and well-nourished.  HENT:  Head: Normocephalic and atraumatic.  Eyes: Conjunctivae and EOM are normal.  Neck: Normal range of motion. Neck supple. No JVD present. Carotid bruit is not present. No thyromegaly present.  Cardiovascular: Normal rate, regular rhythm and normal heart sounds.   No murmur heard. Pulmonary/Chest: Effort normal and breath sounds normal. No respiratory distress. She has no wheezes. She has no rales. She exhibits no tenderness.  Musculoskeletal: She exhibits no edema.  Neurological: She is  alert and oriented to person, place, and time.  Psychiatric: She has a normal mood and affect.  Nursing note and vitals reviewed.  BP 122/78   Pulse 87   Ht 5\' 5"  (1.651 m)   Wt 240 lb (108.9 kg)   SpO2 97%   BMI 39.94 kg/m  Wt Readings from Last 3 Encounters:  04/19/17 240 lb (108.9 kg)  04/01/17 244 lb (110.7 kg)  10/18/16 232 lb (105.2 kg)     Lab Results  Component Value Date   WBC 8.1 03/31/2012   HGB 8.3 (L) 03/31/2012   HCT 26.4 (L) 03/31/2012   PLT 235 03/31/2012   GLUCOSE 95 10/17/2016   CHOL  11/16/2008    184        ATP III CLASSIFICATION:  <200     mg/dL   Desirable  200-239  mg/dL   Borderline High  >=240    mg/dL  High          TRIG 157 (H) 09/15/2011   HDL 33 (L) 11/16/2008   LDLCALC (H) 11/16/2008    130        Total Cholesterol/HDL:CHD Risk Coronary Heart Disease Risk Table                     Men   Women  1/2 Average Risk   3.4   3.3  Average Risk       5.0   4.4  2 X Average Risk   9.6   7.1  3 X Average Risk  23.4   11.0        Use the calculated Patient Ratio above and the CHD Risk Table to determine the patient's CHD Risk.        ATP III CLASSIFICATION (LDL):  <100     mg/dL   Optimal  100-129  mg/dL   Near or Above                    Optimal  130-159  mg/dL   Borderline  160-189  mg/dL   High  >190     mg/dL   Very High   ALT 15 03/26/2012   AST 21 03/26/2012   NA 141 10/17/2016   K 4.3 10/17/2016   CL 104 10/17/2016   CREATININE 0.87 10/17/2016   BUN 14 10/17/2016   CO2 28 10/17/2016   TSH 2.655 09/11/2011   INR 0.98 03/25/2012   HGBA1C  11/16/2008    5.5 (NOTE) The ADA recommends the following therapeutic goal for glycemic control related to Hgb A1c measurement: Goal of therapy: <6.5 Hgb A1c  Reference: American Diabetes Association: Clinical Practice Recommendations 2010, Diabetes Care, 2010, 33: (Suppl  1).    No results found.   Assessment & Plan:  Plan  I have discontinued Ms. Seif's beclomethasone. I have also  changed her sertraline. Additionally, I am having her maintain her docusate sodium, aspirin EC, diltiazem, furosemide, metoprolol tartrate, Tacrolimus, mycophenolate, predniSONE, sulfamethoxazole-trimethoprim, HYDROcodone-acetaminophen, carvedilol, furosemide, albuterol, albuterol, and fluticasone.  Meds ordered this encounter  Medications  . sertraline (ZOLOFT) 50 MG tablet    Sig: Take 1 tablet (50 mg total) by mouth daily.    Dispense:  90 tablet    Refill:  3    Problem List Items Addressed This Visit      Unprioritized   Asthma    Use flovent bid -- rinse with water each time Albuterol as needed Call if symptoms do not improve         Other Visit Diagnoses    Need for influenza vaccination    -  Primary   Relevant Orders   Flu vaccine HIGH DOSE PF (Completed)   Depression, unspecified depression type       Relevant Medications   sertraline (ZOLOFT) 50 MG tablet      Follow-up: Return in about 6 months (around 10/18/2017), or if symptoms worsen or fail to improve.  [name], DO

## 2017-04-19 NOTE — Patient Instructions (Signed)

## 2017-04-19 NOTE — Assessment & Plan Note (Addendum)
Use flovent bid -- rinse with water each time Albuterol as needed Call if symptoms do not improve

## 2017-05-10 ENCOUNTER — Other Ambulatory Visit: Payer: Self-pay | Admitting: Family Medicine

## 2017-05-10 DIAGNOSIS — J4521 Mild intermittent asthma with (acute) exacerbation: Secondary | ICD-10-CM

## 2017-05-10 DIAGNOSIS — Z1231 Encounter for screening mammogram for malignant neoplasm of breast: Secondary | ICD-10-CM

## 2017-05-14 ENCOUNTER — Ambulatory Visit
Admission: RE | Admit: 2017-05-14 | Discharge: 2017-05-14 | Disposition: A | Discharge status: Home / Self Care | Payer: Medicare Other | Source: Ambulatory Visit | Attending: Family Medicine | Admitting: Family Medicine

## 2017-05-14 ENCOUNTER — Telehealth: Payer: Self-pay | Admitting: Cardiology

## 2017-05-14 DIAGNOSIS — Z1231 Encounter for screening mammogram for malignant neoplasm of breast: Secondary | ICD-10-CM | POA: Diagnosis not present

## 2017-05-14 DIAGNOSIS — Z9989 Dependence on other enabling machines and devices: Principal | ICD-10-CM

## 2017-05-14 DIAGNOSIS — G4733 Obstructive sleep apnea (adult) (pediatric): Secondary | ICD-10-CM

## 2017-05-14 IMAGING — MG 2D DIGITAL SCREENING BILATERAL MAMMOGRAM WITH CAD AND ADJUNCT TO
3 series · 3 of 3 positions shown · non-contrast
Comparison: Previous exam(s).

CLINICAL DATA: Screening.

EXAM:
2D DIGITAL SCREENING BILATERAL MAMMOGRAM WITH CAD AND ADJUNCT TOMO

[R MLO]
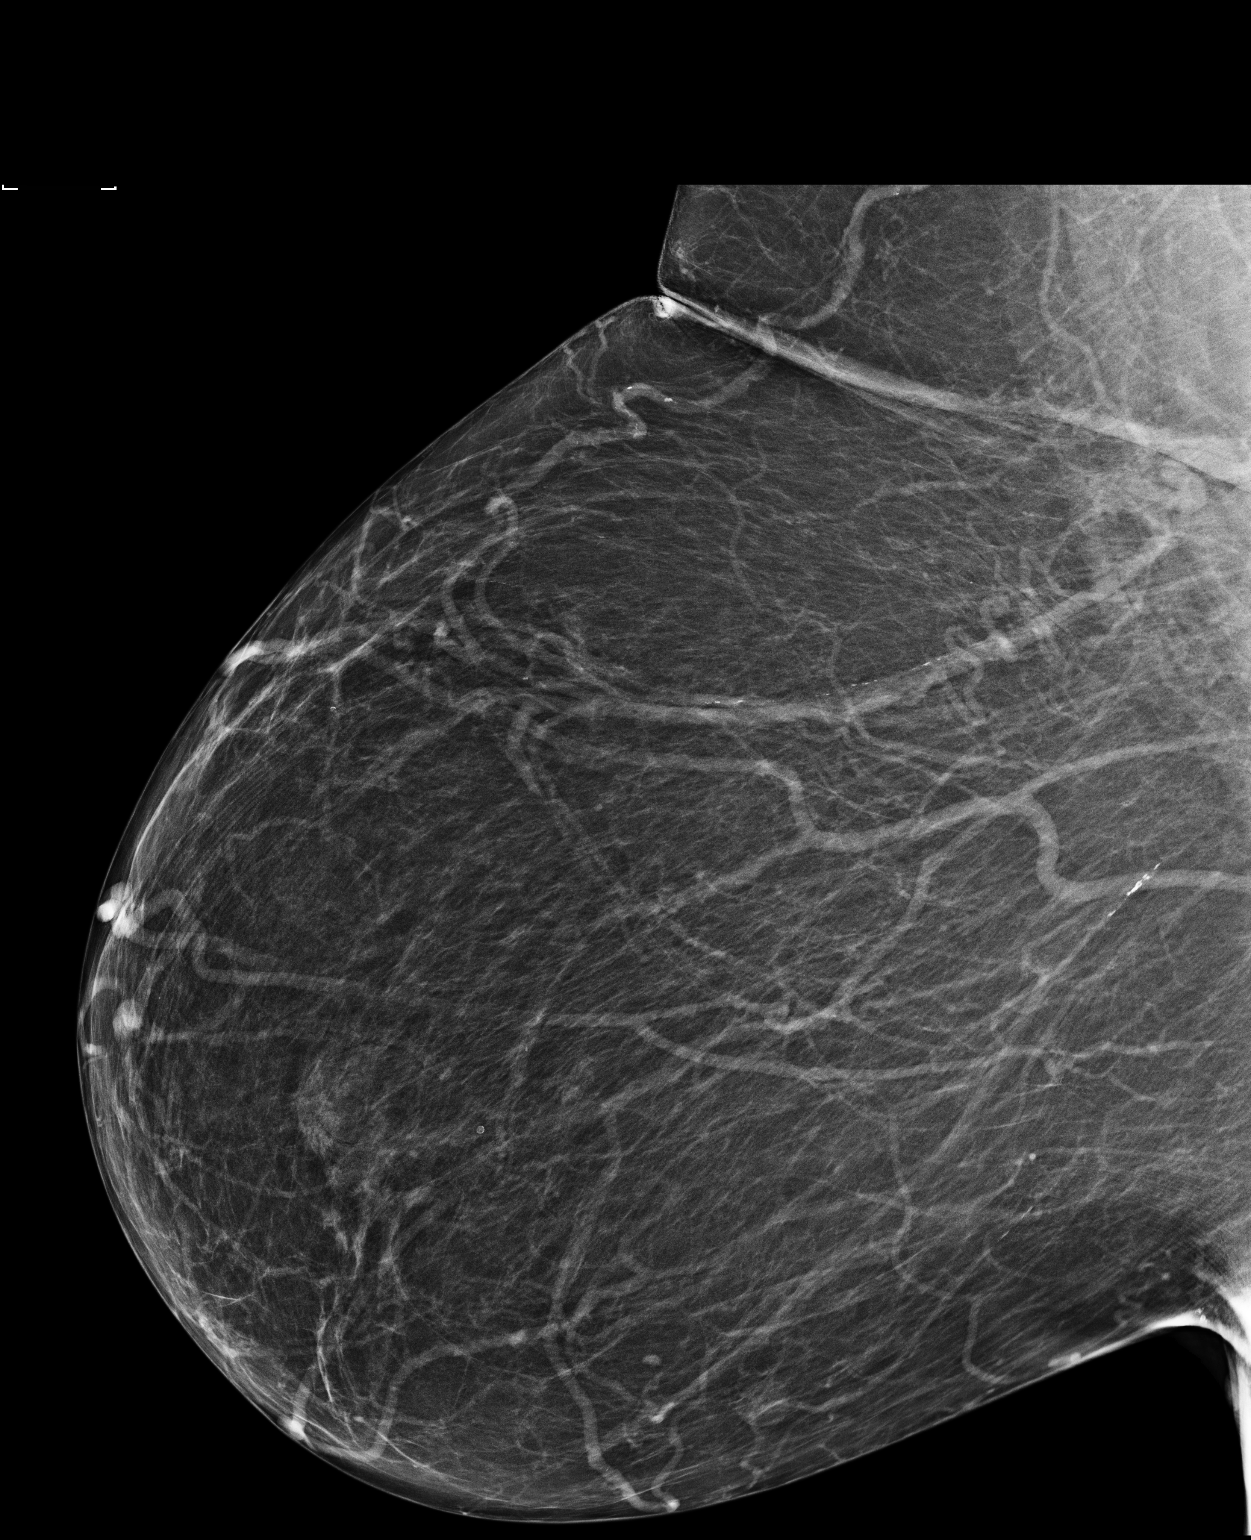

[L MLO]
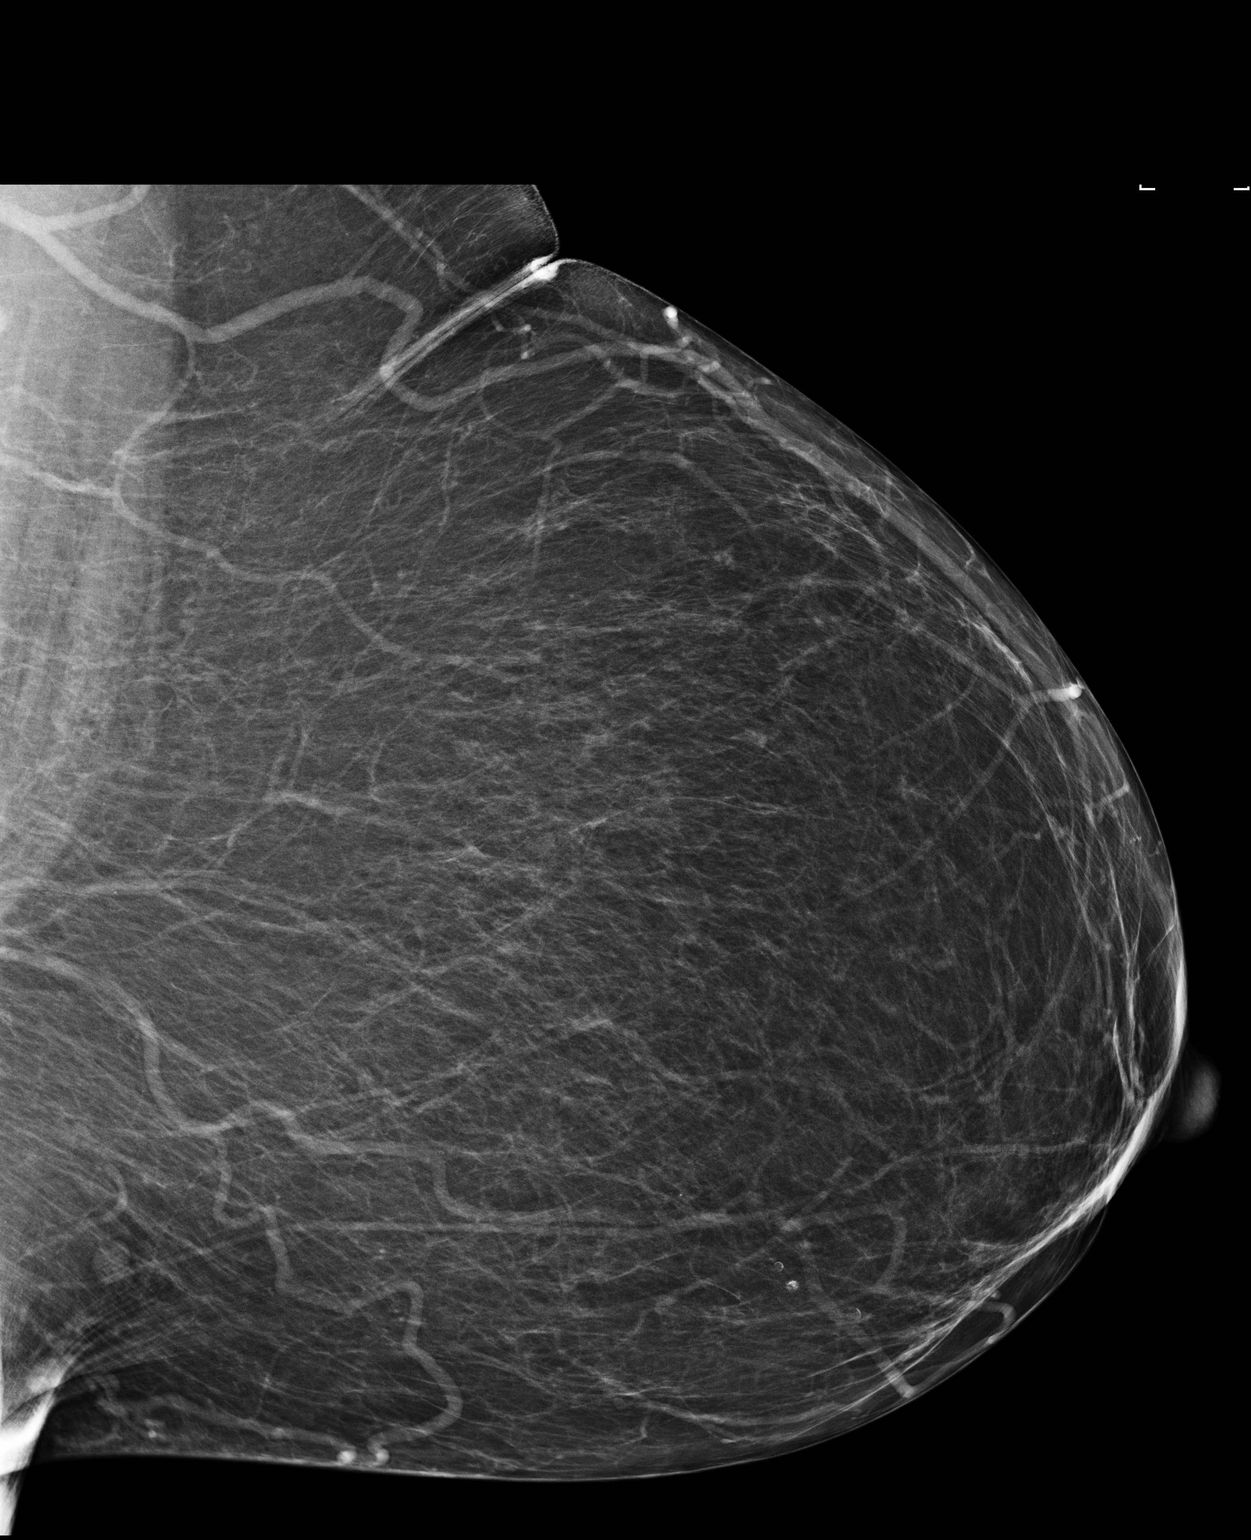

[L CC]
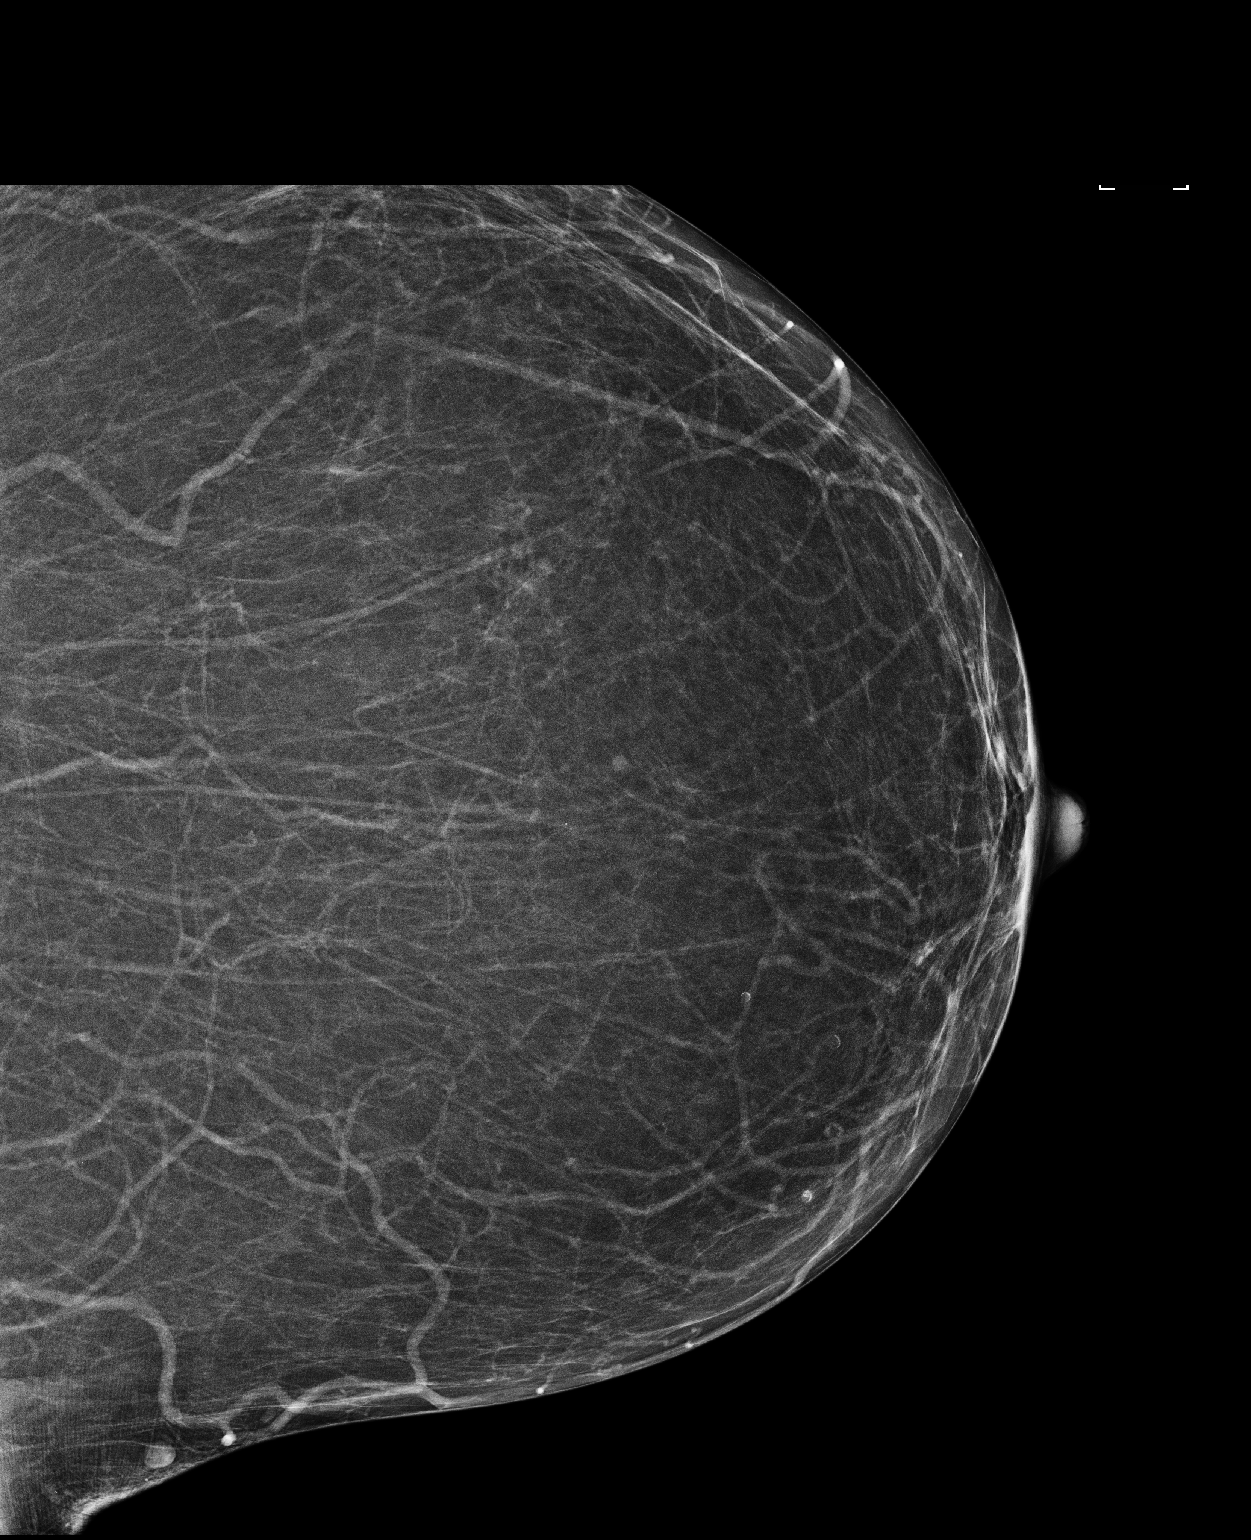

[3 of 3 positions shown; findings below may reference images not displayed]

ACR Breast Density Category b: There are scattered areas of
fibroglandular density.
FINDINGS: There are no findings suspicious for malignancy. Images were
processed with CAD.
IMPRESSION: No mammographic evidence of malignancy. A result letter of this
screening mammogram will be mailed directly to the patient.

RECOMMENDATION:
Screening mammogram in one year. (Code:[33])

BI-RADS CATEGORY  1: Negative.

## 2017-05-14 NOTE — Telephone Encounter (Signed)
New Message  Pt call requesting to get a prescription for a new cpap machine. Please call back to discuss

## 2017-05-15 NOTE — Telephone Encounter (Signed)
Please get an in lab CPAP titration

## 2017-05-15 NOTE — Telephone Encounter (Signed)
Hello Dr Radford Pax, this patient last saw you June 17 2014. She was suppose to follow up with you in 6 months but due to having a kidney transplant and everything that comes with that she never made another appointment. Now the patient is calling because her machine is broken and she needs a new one. I have made her an appointment for 07/26/2017 with you. Her sleep studies is in [facility]  11/12/2004 and 03/01/2008. Patient states her machine is set on 14cm H20.

## 2017-05-16 ENCOUNTER — Encounter: Payer: Self-pay | Admitting: *Deleted

## 2017-05-16 NOTE — Telephone Encounter (Addendum)
CPAP Titration ordered today. Informed patient of upcoming CPAP TITRATION and patient understanding was verbalized. Patient understands her CPAP TITRATION is scheduled for Sunday June 30 2017. Patient understands her TITRATION study will be done at [facility] sleep lab. Patient understands she will receive a sleep packet in a week or so. Patient understands to call if she does not receive the sleep packet in a timely manner. Patient agrees with treatment and thanked me for call.

## 2017-05-28 ENCOUNTER — Encounter: Payer: Self-pay | Admitting: Cardiology

## 2017-06-03 DIAGNOSIS — E785 Hyperlipidemia, unspecified: Secondary | ICD-10-CM | POA: Diagnosis not present

## 2017-06-03 DIAGNOSIS — I770 Arteriovenous fistula, acquired: Secondary | ICD-10-CM | POA: Diagnosis not present

## 2017-06-03 DIAGNOSIS — R809 Proteinuria, unspecified: Secondary | ICD-10-CM | POA: Diagnosis not present

## 2017-06-03 DIAGNOSIS — D899 Disorder involving the immune mechanism, unspecified: Secondary | ICD-10-CM | POA: Diagnosis not present

## 2017-06-03 DIAGNOSIS — N390 Urinary tract infection, site not specified: Secondary | ICD-10-CM | POA: Diagnosis not present

## 2017-06-03 DIAGNOSIS — N2581 Secondary hyperparathyroidism of renal origin: Secondary | ICD-10-CM | POA: Diagnosis not present

## 2017-06-03 DIAGNOSIS — Z79899 Other long term (current) drug therapy: Secondary | ICD-10-CM | POA: Diagnosis not present

## 2017-06-03 DIAGNOSIS — E669 Obesity, unspecified: Secondary | ICD-10-CM | POA: Diagnosis not present

## 2017-06-03 DIAGNOSIS — I129 Hypertensive chronic kidney disease with stage 1 through stage 4 chronic kidney disease, or unspecified chronic kidney disease: Secondary | ICD-10-CM | POA: Diagnosis not present

## 2017-06-03 DIAGNOSIS — M109 Gout, unspecified: Secondary | ICD-10-CM | POA: Diagnosis not present

## 2017-06-03 DIAGNOSIS — N182 Chronic kidney disease, stage 2 (mild): Secondary | ICD-10-CM | POA: Diagnosis not present

## 2017-06-03 DIAGNOSIS — Z940 Kidney transplant status: Secondary | ICD-10-CM | POA: Diagnosis not present

## 2017-06-13 DIAGNOSIS — Z940 Kidney transplant status: Secondary | ICD-10-CM | POA: Diagnosis not present

## 2017-06-14 DIAGNOSIS — R262 Difficulty in walking, not elsewhere classified: Secondary | ICD-10-CM | POA: Diagnosis not present

## 2017-06-14 DIAGNOSIS — M545 Low back pain: Secondary | ICD-10-CM | POA: Diagnosis not present

## 2017-06-18 DIAGNOSIS — M545 Low back pain: Secondary | ICD-10-CM | POA: Diagnosis not present

## 2017-06-18 DIAGNOSIS — R262 Difficulty in walking, not elsewhere classified: Secondary | ICD-10-CM | POA: Diagnosis not present

## 2017-06-20 ENCOUNTER — Telehealth: Payer: Self-pay | Admitting: Family Medicine

## 2017-06-20 NOTE — Telephone Encounter (Signed)
Copied from [place] [phone]. Topic: Quick Communication - See Telephone Encounter >> Jun 20, 2017  9:32 AM Bea Graff, NT wrote: CRM for notification. See Telephone encounter for: Patient calling and states that the medicine for her asthma is not helping her. She would like to speak with Dr. Carollee Herter or her nurse.  06/20/17.

## 2017-06-20 NOTE — Telephone Encounter (Signed)
Left message on machine to call back  

## 2017-06-21 ENCOUNTER — Other Ambulatory Visit: Payer: Self-pay | Admitting: Family Medicine

## 2017-06-21 DIAGNOSIS — M545 Low back pain: Secondary | ICD-10-CM | POA: Diagnosis not present

## 2017-06-21 DIAGNOSIS — R262 Difficulty in walking, not elsewhere classified: Secondary | ICD-10-CM | POA: Diagnosis not present

## 2017-06-21 DIAGNOSIS — J4521 Mild intermittent asthma with (acute) exacerbation: Secondary | ICD-10-CM

## 2017-06-26 NOTE — Telephone Encounter (Signed)
Patient states that she is ok now but she wanted to let you know that she had to use the albuterol inhaler more with using the flovent.  She stated that you may increase the flovent if she had to use the albuterol

## 2017-06-26 NOTE — Telephone Encounter (Signed)
Inc to floven 220 2 puffs bid #1  5 refills

## 2017-06-27 ENCOUNTER — Other Ambulatory Visit: Payer: Self-pay | Admitting: Family Medicine

## 2017-06-27 MED ORDER — FLUTICASONE PROPIONATE HFA 220 MCG/ACT IN AERO: 2.0000 | INHALATION_SPRAY | Freq: Two times a day (BID) | RESPIRATORY_TRACT | 5 refills | Status: DC

## 2017-06-27 NOTE — Telephone Encounter (Signed)
Patient notified rx sent in.

## 2017-06-28 DIAGNOSIS — R262 Difficulty in walking, not elsewhere classified: Secondary | ICD-10-CM | POA: Diagnosis not present

## 2017-06-28 DIAGNOSIS — M545 Low back pain: Secondary | ICD-10-CM | POA: Diagnosis not present

## 2017-06-30 ENCOUNTER — Ambulatory Visit (HOSPITAL_BASED_OUTPATIENT_CLINIC_OR_DEPARTMENT_OTHER): Payer: Medicare Other | Attending: Cardiology | Admitting: Cardiology

## 2017-06-30 VITALS — Ht 65.0 in | Wt 232.0 lb

## 2017-06-30 DIAGNOSIS — Z9989 Dependence on other enabling machines and devices: Secondary | ICD-10-CM

## 2017-06-30 DIAGNOSIS — I4891 Unspecified atrial fibrillation: Secondary | ICD-10-CM | POA: Insufficient documentation

## 2017-06-30 DIAGNOSIS — G4733 Obstructive sleep apnea (adult) (pediatric): Secondary | ICD-10-CM | POA: Insufficient documentation

## 2017-07-02 DIAGNOSIS — M545 Low back pain: Secondary | ICD-10-CM | POA: Diagnosis not present

## 2017-07-02 DIAGNOSIS — R262 Difficulty in walking, not elsewhere classified: Secondary | ICD-10-CM | POA: Diagnosis not present

## 2017-07-03 DIAGNOSIS — N390 Urinary tract infection, site not specified: Secondary | ICD-10-CM | POA: Diagnosis not present

## 2017-07-03 NOTE — Procedures (Signed)
   NAME: [name] DATE OF BIRTH:  05-06-1946 MEDICAL RECORD NUMBER [id number]  LOCATION: [facility]  PHYSICIAN: Traci Turner  DATE OF STUDY: 06/30/2017  SLEEP STUDY TYPE: Positive Airway Pressure Titration               REFERRING PHYSICIAN: Sueanne Margarita, MD  HEIGHT: 5\' 5"  (165.1 cm)  WEIGHT: 232 lb (105.2 kg)    Body mass index is 38.61 kg/m.    CLINICAL INFORMATION The patient is referred for a CPAP titration to treat sleep apnea.???????  SLEEP STUDY TECHNIQUE As per the AASM Manual for the Scoring of Sleep and Associated Events v2.3 (April 2016) with a hypopnea requiring 4% desaturations.  The channels recorded and monitored were frontal, central and occipital EEG, electrooculogram (EOG), submentalis EMG (chin), nasal and oral airflow, thoracic and abdominal wall motion, anterior tibialis EMG, snore microphone, electrocardiogram, and pulse oximetry. Continuous positive airway pressure (CPAP) was initiated at the beginning of the study and titrated to treat sleep-disordered breathing.  MEDICATIONS Medications self-administered by patient taken the night of the study : MYCOPHENOLATE, CARVEDILOL  TECHNICIAN COMMENTS Comments added by technician: NONE Comments added by scorer: N/A  RESPIRATORY PARAMETERS Optimal PAP Pressure (cm): N/A AHI at Optimal Pressure (/hr):N/A Overall Minimal O2 (%):84.00  Supine % at Optimal Pressure (%):N/A Minimal O2 at Optimal Pressure (%): 81.00   SLEEP ARCHITECTURE The study was initiated at 11:15:59 PM and ended at 5:09:38 AM.  Sleep onset time was 22.0 minutes and the sleep efficiency was 55.3%. The total sleep time was 195.5 minutes.  The patient spent 8.70% of the night in stage N1 sleep, 91.30% in stage N2 sleep, 0.00% in stage N3 and 0.00% in REM.Stage REM latency was N/A minutes  Wake after sleep onset was 136.2. Alpha intrusion was absent. Supine sleep was 100.00%.  CARDIAC DATA The 2 lead EKG  demonstrated atrial fibrillation. The mean heart rate was 78.15 beats per minute. Other EKG findings include: PVCs.  LEG MOVEMENT DATA The total Periodic Limb Movements of Sleep (PLMS) were 50. The PLMS index was 15.35. A PLMS index of <15 is considered normal in adults.  IMPRESSIONS - An optimal PAP pressure could not be selected for this patient based on the available study data. - Central sleep apnea was not noted during this titration (CAI = 0.0/h). - Moderate oxygen desaturations were observed during this titration (min O2 = 84.00%). - No snoring was audible during this study. - 2-lead EKG demonstrated: PVCs and atrial fibrillation - Mild periodic limb movements were observed during this study. Arousals associated with PLMs were significant.  DIAGNOSIS - Obstructive Sleep Apnea (327.23 [G47.33 ICD-10]) - Atrial Fibrillation  RECOMMENDATIONS - Recommend a trial of Auto-BiPAP in sleep lab - Avoid alcohol, sedatives and other CNS depressants that may worsen sleep apnea and disrupt normal sleep architecture. - Sleep hygiene should be reviewed to assess factors that may improve sleep quality. - Weight management and regular exercise should be initiated or continued.   [place], American Board of Sleep Medicine  ELECTRONICALLY SIGNED ON:  07/03/2017, 10:51 PM [place] PH: [phone]   FX: [phone] [place]

## 2017-07-05 DIAGNOSIS — M545 Low back pain: Secondary | ICD-10-CM | POA: Diagnosis not present

## 2017-07-05 DIAGNOSIS — R262 Difficulty in walking, not elsewhere classified: Secondary | ICD-10-CM | POA: Diagnosis not present

## 2017-07-10 ENCOUNTER — Telehealth: Payer: Self-pay | Admitting: *Deleted

## 2017-07-10 DIAGNOSIS — S52501A Unspecified fracture of the lower end of right radius, initial encounter for closed fracture: Secondary | ICD-10-CM | POA: Insufficient documentation

## 2017-07-10 DIAGNOSIS — I739 Peripheral vascular disease, unspecified: Secondary | ICD-10-CM | POA: Insufficient documentation

## 2017-07-10 DIAGNOSIS — F329 Major depressive disorder, single episode, unspecified: Secondary | ICD-10-CM | POA: Insufficient documentation

## 2017-07-10 DIAGNOSIS — I10 Essential (primary) hypertension: Secondary | ICD-10-CM | POA: Insufficient documentation

## 2017-07-10 DIAGNOSIS — K5900 Constipation, unspecified: Secondary | ICD-10-CM | POA: Insufficient documentation

## 2017-07-10 DIAGNOSIS — M8430XA Stress fracture, unspecified site, initial encounter for fracture: Secondary | ICD-10-CM | POA: Insufficient documentation

## 2017-07-10 DIAGNOSIS — M1990 Unspecified osteoarthritis, unspecified site: Secondary | ICD-10-CM | POA: Insufficient documentation

## 2017-07-10 DIAGNOSIS — F32A Depression, unspecified: Secondary | ICD-10-CM | POA: Insufficient documentation

## 2017-07-10 DIAGNOSIS — R34 Anuria and oliguria: Secondary | ICD-10-CM | POA: Insufficient documentation

## 2017-07-10 DIAGNOSIS — L309 Dermatitis, unspecified: Secondary | ICD-10-CM | POA: Insufficient documentation

## 2017-07-10 DIAGNOSIS — G4730 Sleep apnea, unspecified: Secondary | ICD-10-CM | POA: Insufficient documentation

## 2017-07-10 NOTE — Telephone Encounter (Signed)
-----   Message from Sueanne Margarita, MD sent at 07/03/2017 10:57 PM EST ----- Please let patient know that they had an unsuccessful CPAP titration set up for BIPAP titration in lab

## 2017-07-10 NOTE — Telephone Encounter (Signed)
Informed patient of CPAP titration results and verbalized understanding was indicated. Patient understands her CPAP titration was unsuccessful and Dr Radford Pax recommends a BiPAP titration in lab. Patient understands her Titration study will be done at [facility] sleep lab. Patient understands she will receive a sleep packet in a week or so. Patient understands to call if she does not receive the sleep packet in a timely manner.  Patient states she does not want to return back to the lab for the BiPAP Titration. Patient wants to discuss the BiPAP Titration with Dr Radford Pax at her 2022/08/04 appointment. Patient agrees with treatment and thanked me for call.  BiPAP titration not ordered today.

## 2017-07-12 DIAGNOSIS — M545 Low back pain: Secondary | ICD-10-CM | POA: Diagnosis not present

## 2017-07-12 DIAGNOSIS — R262 Difficulty in walking, not elsewhere classified: Secondary | ICD-10-CM | POA: Diagnosis not present

## 2017-07-17 DIAGNOSIS — D8989 Other specified disorders involving the immune mechanism, not elsewhere classified: Secondary | ICD-10-CM | POA: Diagnosis not present

## 2017-07-17 DIAGNOSIS — E785 Hyperlipidemia, unspecified: Secondary | ICD-10-CM | POA: Diagnosis not present

## 2017-07-17 DIAGNOSIS — E8770 Fluid overload, unspecified: Secondary | ICD-10-CM | POA: Diagnosis not present

## 2017-07-17 DIAGNOSIS — Z792 Long term (current) use of antibiotics: Secondary | ICD-10-CM | POA: Diagnosis not present

## 2017-07-17 DIAGNOSIS — Z940 Kidney transplant status: Secondary | ICD-10-CM | POA: Diagnosis not present

## 2017-07-17 DIAGNOSIS — E878 Other disorders of electrolyte and fluid balance, not elsewhere classified: Secondary | ICD-10-CM | POA: Diagnosis not present

## 2017-07-17 DIAGNOSIS — E119 Type 2 diabetes mellitus without complications: Secondary | ICD-10-CM | POA: Diagnosis not present

## 2017-07-17 DIAGNOSIS — Z79899 Other long term (current) drug therapy: Secondary | ICD-10-CM | POA: Diagnosis not present

## 2017-07-17 DIAGNOSIS — Z5181 Encounter for therapeutic drug level monitoring: Secondary | ICD-10-CM | POA: Diagnosis not present

## 2017-07-17 DIAGNOSIS — Z7952 Long term (current) use of systemic steroids: Secondary | ICD-10-CM | POA: Diagnosis not present

## 2017-07-17 DIAGNOSIS — I10 Essential (primary) hypertension: Secondary | ICD-10-CM | POA: Diagnosis not present

## 2017-07-17 DIAGNOSIS — Z4822 Encounter for aftercare following kidney transplant: Secondary | ICD-10-CM | POA: Diagnosis not present

## 2017-07-18 ENCOUNTER — Telehealth: Payer: Self-pay | Admitting: Family Medicine

## 2017-07-18 NOTE — Telephone Encounter (Signed)
Copied from [place] [id number]. Topic: General - Other >> Jul 18, 2017  9:06 AM Yvette Rack wrote: Reason for CRM: patient husband called stating that his wife isn't getting any better with her asthma and that he might end up taking her to the hospital or a specialist he would like for someone to give his wife a call to check on her I asked him what was her symptoms he said that someone just need to call her and hung up I tried to call her at both numbers in her chart there was no answer

## 2017-07-18 NOTE — Telephone Encounter (Signed)
Patient states that her asthma has been bad for about 1 and 1/2 weeks now.  She has had some SOB and wheezing.  She had some leg swelling which her kidney doctor increased her lasix to 80mg .  Her husband was on the phone as well and states that she has fluid on her lungs.  Advised patient that she needs to be seen.  She stated that she has an appointment on Tuesday.  We had an opening for tomorrow at 315pm and her appt was moved up.  Also advised that if she gets worse before being seen to go to ER and do not wait til appointment.

## 2017-07-19 ENCOUNTER — Ambulatory Visit (INDEPENDENT_AMBULATORY_CARE_PROVIDER_SITE_OTHER): Payer: Medicare Other | Admitting: Family Medicine

## 2017-07-19 ENCOUNTER — Encounter: Payer: Self-pay | Admitting: Family Medicine

## 2017-07-19 VITALS — BP 116/82 | HR 61 | Temp 97.8°F | Ht 65.0 in | Wt 252.8 lb

## 2017-07-19 DIAGNOSIS — J45901 Unspecified asthma with (acute) exacerbation: Secondary | ICD-10-CM

## 2017-07-19 DIAGNOSIS — J4550 Severe persistent asthma, uncomplicated: Secondary | ICD-10-CM | POA: Diagnosis not present

## 2017-07-19 MED ORDER — PREDNISONE 10 MG PO TABS: ORAL_TABLET | ORAL | 0 refills | Status: DC

## 2017-07-19 MED ORDER — ALBUTEROL SULFATE (2.5 MG/3ML) 0.083% IN NEBU: 2.5000 mg | INHALATION_SOLUTION | Freq: Four times a day (QID) | RESPIRATORY_TRACT | 12 refills | Status: DC | PRN

## 2017-07-19 MED ORDER — METHYLPREDNISOLONE ACETATE 40 MG/ML IJ SUSP
40.0000 mg | Freq: Once | INTRAMUSCULAR | Status: AC
  Administered 2017-07-19: 40 mg via INTRAMUSCULAR

## 2017-07-19 MED ORDER — IPRATROPIUM-ALBUTEROL 0.5-2.5 (3) MG/3ML IN SOLN
3.0000 mL | Freq: Once | RESPIRATORY_TRACT | Status: AC
  Administered 2017-07-19: 3 mL via RESPIRATORY_TRACT

## 2017-07-19 NOTE — Progress Notes (Signed)
Patient ID: [name], female    DOB: [birth year]  Age: 72 y.o. MRN: [id number]    Subjective:  Subjective  HPI [name] presents for exacerbation of her asthma ----  Wheezing x 3 weeks , no fever,  She is better when she uses her neb at home.  + sob  No chest pain,  Clear/ white mucus only  Review of Systems  Constitutional: Negative for chills and fever.  HENT: Negative for congestion, postnasal drip, rhinorrhea and sinus pressure.   Respiratory: Positive for cough, chest tightness, shortness of breath and wheezing.   Cardiovascular: Negative for chest pain, palpitations and leg swelling.  Allergic/Immunologic: Negative for environmental allergies.    History Past Medical History:  Diagnosis Date  . Anemia   . Arthritis    HIP  . Asthma    as a child  . Blood transfusion    never had a reaction to blood transfusion  . Cardiomyopathy Mar 2013   Mild, EF 50-55% by Mar 2013 ECHO, diast dysfxn II  . Closed fracture of right distal radius 10/18/2016  . Constipation    takes Miralax daily  . Depression    takes Zoloft daily  . Distal radius fracture, right   . DVT of lower extremity, bilateral ([place]) 12/21/11   "they're there now; been there for 2 wks"  . Eczema   . ESRD (end stage renal disease) on dialysis [facility]) 09/2011   07-19-2015 had Kidney transplant at [facility]  . Fractures, stress    in both feet--6 OR 7 YRS AGO--RESOLVED  . Gout    doesn't require meds   . HCAP (healthcare-associated pneumonia)    09/10/11  . Hypercalcemia    09/10/11  . Hypertension    takes Diltiazem daily   . Morbid obesity ([place])   . Nonischemic dilated cardiomyopathy ([place])   . Oligouria   . PAF (paroxysmal atrial fibrillation) ([facility])     HX OF CEREBRAL BLEED WHILE ON COUMADIN-SO PT NOT ON ANY BLOOD THINNERS NOW  . Peripheral vascular disease ([place])   . Sleep apnea    uses CPAP nightly  . Stroke [facility]) 2009   denies residual, hemorrhagic now off coumadin  . Stroke due to intracerebral  hemorrhage [facility]) 2009    She has a past surgical history that includes Cardiac valuve replacement (1960); I&D extremity (09/15/2011); AV fistula placement (09/28/2011); Cystoscopy; Colonoscopy; Irrigation and debridement abscess (12/21/11); Vena cava filter placement (11/2011); Total hip arthroplasty (02/2011); Femur fracture surgery (03/2011; 09/03/2011); Insertion of dialysis catheter; Cholecystectomy; Incision and drainage hip (12/21/2011); Total hip revision (03/25/2012); Kidney transplant (07/19/15); Joint replacement; Laparoscopic gastric banding; and Open reduction internal fixation (orif) distal radial fracture (Right, 10/18/2016).   Her family history includes Cancer in her father; Diabetes in her mother; Hodgkin's lymphoma (age of onset: 57) in her unknown relative; Hypertension in her mother.She reports that  has never smoked. she has never used smokeless tobacco. She reports that she does not drink alcohol or use drugs.  Current Outpatient Medications on File Prior to Visit  Medication Sig Dispense Refill  . albuterol (PROVENTIL HFA;VENTOLIN HFA) 108 (90 Base) MCG/ACT inhaler Inhale 2 puffs into the lungs every 6 (six) hours as needed for wheezing or shortness of breath. 18 g 5  . aspirin EC 81 MG tablet Take 81 mg by mouth.    . carvedilol (COREG) 12.5 MG tablet     . diltiazem (CARDIZEM CD) 240 MG 24 hr capsule Take 1 capsule (240 mg total)  by mouth daily after breakfast. 90 capsule 0  . docusate sodium (COLACE) 100 MG capsule Take 100 mg by mouth daily as needed (For constipation.).     Marland Kitchen fluticasone (FLOVENT HFA) 220 MCG/ACT inhaler Inhale 2 puffs into the lungs 2 (two) times daily. 1 Inhaler 5  . furosemide (LASIX) 40 MG tablet Take 40 mg by mouth daily. Take 2 tablets by mouth daily (Total 80 mg)    . HYDROcodone-acetaminophen (NORCO) 10-325 MG tablet Take 1-2 tablets by mouth every 6 (six) hours as needed. 50 tablet 0  . mycophenolate (MYFORTIC) 180 MG EC tablet Take 180 mg by mouth 2 (two) times  daily.    . predniSONE (DELTASONE) 5 MG tablet Take 5 mg by mouth daily with breakfast.    . sertraline (ZOLOFT) 50 MG tablet Take 1 tablet (50 mg total) by mouth daily. 90 tablet 3  . Tacrolimus (ENVARSUS XR) 1 MG TB24 Take by mouth.    . [DISCONTINUED] colchicine 0.6 MG tablet Take 1 tablet (0.6 mg total) by mouth daily. 31 tablet 0  . [DISCONTINUED] gabapentin (NEURONTIN) 300 MG capsule Take 300 mg by mouth 3 (three) times daily.     . [DISCONTINUED] metoprolol (TOPROL XL) 100 MG 24 hr tablet Take 100 mg by mouth daily before breakfast.     . [DISCONTINUED] potassium chloride SA (KLOR-CON M20) 20 MEQ tablet Take 20 mEq by mouth daily.     . [DISCONTINUED] rivaroxaban (XARELTO) 10 MG TABS tablet Take 1 tablet (10 mg total) by mouth daily. 14 tablet 0   No current facility-administered medications on file prior to visit.      Objective:  Objective  Physical Exam  Constitutional: She is oriented to person, place, and time. She appears well-developed and well-nourished.  HENT:  Right Ear: External ear normal.  Left Ear: External ear normal.  Eyes: Conjunctivae are normal. Right eye exhibits no discharge. Left eye exhibits no discharge.  Cardiovascular: Normal rate, regular rhythm and normal heart sounds.  No murmur heard. Pulmonary/Chest: No accessory muscle usage. No respiratory distress. She has decreased breath sounds. She has wheezes. She has no rhonchi. She has no rales. She exhibits no tenderness.  Cleared with neb and pulse ox 95%   Musculoskeletal: She exhibits no edema.  Lymphadenopathy:    She has no cervical adenopathy.  Neurological: She is alert and oriented to person, place, and time.  Nursing note and vitals reviewed.  BP 116/82   Pulse 61   Temp 97.8 F (36.6 C) (Oral)   Ht 5\' 5"  (1.651 m)   Wt 252 lb 12.8 oz (114.7 kg)   SpO2 90%   BMI 42.07 kg/m  Wt Readings from Last 3 Encounters:  07/19/17 252 lb 12.8 oz (114.7 kg)  06/30/17 232 lb (105.2 kg)  04/19/17  240 lb (108.9 kg)     Lab Results  Component Value Date   WBC 8.1 03/31/2012   HGB 8.3 (L) 03/31/2012   HCT 26.4 (L) 03/31/2012   PLT 235 03/31/2012   GLUCOSE 95 10/17/2016   CHOL  11/16/2008    184        ATP III CLASSIFICATION:  <200     mg/dL   Desirable  200-239  mg/dL   Borderline High  >=240    mg/dL   High          TRIG 157 (H) 09/15/2011   HDL 33 (L) 11/16/2008   LDLCALC (H) 11/16/2008    130  Total Cholesterol/HDL:CHD Risk Coronary Heart Disease Risk Table                     Men   Women  1/2 Average Risk   3.4   3.3  Average Risk       5.0   4.4  2 X Average Risk   9.6   7.1  3 X Average Risk  23.4   11.0        Use the calculated Patient Ratio above and the CHD Risk Table to determine the patient's CHD Risk.        ATP III CLASSIFICATION (LDL):  <100     mg/dL   Optimal  100-129  mg/dL   Near or Above                    Optimal  130-159  mg/dL   Borderline  160-189  mg/dL   High  >190     mg/dL   Very High   ALT 15 03/26/2012   AST 21 03/26/2012   NA 141 10/17/2016   K 4.3 10/17/2016   CL 104 10/17/2016   CREATININE 0.87 10/17/2016   BUN 14 10/17/2016   CO2 28 10/17/2016   TSH 2.655 09/11/2011   INR 0.98 03/25/2012   HGBA1C  11/16/2008    5.5 (NOTE) The ADA recommends the following therapeutic goal for glycemic control related to Hgb A1c measurement: Goal of therapy: <6.5 Hgb A1c  Reference: American Diabetes Association: Clinical Practice Recommendations 2010, Diabetes Care, 2010, 33: (Suppl  1).    Mm Screening Breast Tomo Bilateral  Result Date: 05/15/2017 CLINICAL DATA:  Screening. EXAM: 2D DIGITAL SCREENING BILATERAL MAMMOGRAM WITH CAD AND ADJUNCT TOMO COMPARISON:  Previous exam(s). ACR Breast Density Category b: There are scattered areas of fibroglandular density. FINDINGS: There are no findings suspicious for malignancy. Images were processed with CAD. IMPRESSION: No mammographic evidence of malignancy. A result letter of this  screening mammogram will be mailed directly to the patient. RECOMMENDATION: Screening mammogram in one year. (Code:SM-B-01Y) BI-RADS CATEGORY  1: Negative. Electronically Signed   By: Lillia Mountain M.D.   On: 05/15/2017 13:45     Assessment & Plan:  Plan  I have discontinued Neidy T. Coleson's metoprolol tartrate and sulfamethoxazole-trimethoprim. I am also having her start on predniSONE. Additionally, I am having her maintain her docusate sodium, aspirin EC, diltiazem, Tacrolimus, mycophenolate, predniSONE, HYDROcodone-acetaminophen, carvedilol, furosemide, sertraline, albuterol, fluticasone, and albuterol. We administered methylPREDNISolone acetate and ipratropium-albuterol.  Meds ordered this encounter  Medications  . methylPREDNISolone acetate (DEPO-MEDROL) injection 40 mg  . ipratropium-albuterol (DUONEB) 0.5-2.5 (3) MG/3ML nebulizer solution 3 mL  . predniSONE (DELTASONE) 10 MG tablet    Sig: TAKE 3 TABLETS PO QD FOR 3 DAYS THEN TAKE 2 TABLETS PO QD FOR 3 DAYS THEN TAKE 1 TABLET PO QD FOR 3 DAYS THEN TAKE 1/2 TAB PO QD FOR 3 DAYS    Dispense:  20 tablet    Refill:  0  . albuterol (PROVENTIL) (2.5 MG/3ML) 0.083% nebulizer solution    Sig: Take 3 mLs (2.5 mg total) by nebulization every 6 (six) hours as needed for wheezing or shortness of breath.    Dispense:  75 mL    Refill:  12    Problem List Items Addressed This Visit      Unprioritized   Asthma - Primary   Relevant Medications   methylPREDNISolone acetate (DEPO-MEDROL) injection 40 mg (Completed)   ipratropium-albuterol (DUONEB)  0.5-2.5 (3) MG/3ML nebulizer solution 3 mL (Completed)   predniSONE (DELTASONE) 10 MG tablet   albuterol (PROVENTIL) (2.5 MG/3ML) 0.083% nebulizer solution   Exacerbation of asthma   Relevant Medications   methylPREDNISolone acetate (DEPO-MEDROL) injection 40 mg (Completed)   ipratropium-albuterol (DUONEB) 0.5-2.5 (3) MG/3ML nebulizer solution 3 mL (Completed)   predniSONE (DELTASONE) 10 MG tablet    albuterol (PROVENTIL) (2.5 MG/3ML) 0.083% nebulizer solution    pt instructed to go to ER if breathing becomes difficult  Follow-up: Return in about 1 week (around 07/26/2017), or if symptoms worsen or fail to improve, for f/u asthma unless completely resolved.  [name], DO

## 2017-07-19 NOTE — Patient Instructions (Signed)

## 2017-07-23 ENCOUNTER — Ambulatory Visit: Payer: Medicare Other | Admitting: Family Medicine

## 2017-07-23 ENCOUNTER — Telehealth: Payer: Self-pay | Admitting: Family Medicine

## 2017-07-23 NOTE — Telephone Encounter (Addendum)
Follow up call made to patient. States pharmacy told her it was too soon for refill of Albuterol for Nebulizer. Advised patient we usually do not carry samples of this medication only carry for emergencies. Patient to check with Pharmacy to see when she can have  her next refill advised patient the order states she is to use every 6 hours prn. States she may have used more than this when she woke up during the night.Marland Kitchen

## 2017-07-23 NOTE — Telephone Encounter (Signed)
Pt states she called pharmacy and she can not get her medicine until 1/21

## 2017-07-23 NOTE — Telephone Encounter (Signed)
Copied from [place] [id number]. Topic: Quick Communication - See Telephone Encounter >> Jul 23, 2017 11:17 AM Antonieta Iba C wrote:    CRM for notification. See Telephone encounter for: pt called in she would like to know if office have samples of medication that she place on her breathing machine. Pt says that she is completely out and pharmacy says that she is not due for refill. Please advise.   CB: [phone]  07/23/17.

## 2017-07-24 NOTE — Telephone Encounter (Signed)
Spoke with patient yesterday and she stated that she did not have any medication left.  At first she stated that she was using it every 4-6 hours as directed.  Advised patient she should not need that much. Later on in conversation she stated that she takes it only once a day.  She also said that she will "just suffer."  Advised patient that we do not want her to do that.  I asked patient were is your box.  She did know where it was.  Advised patient to look around for box.  Called back today and patient stated that she had found some.

## 2017-07-25 NOTE — Progress Notes (Signed)
Cardiology Office Note:    Date:  07/26/2017   ID:  [name], DOB 05/21/1946, MRN [id number]  PCP:  [name], DO  Cardiologist:  No primary care provider on file.    Referring MD: Carollee Herter, Alferd Apa, *   Chief Complaint  Patient presents with  . Follow-up  . Cardiomyopathy  . Sleep Apnea  . Hypertension  . Atrial Fibrillation    History of Present Illness:    [name] is a 72 y.o. female with a hx of with a history of hypertensive DCM, HTN, OSA, morbid obesity and PAF.  I have not seen her in over 3 years and she is here for followup. Since I saw her she has had a renal transplant which was complicated by new onset atrial flutter and she underwent aflutter ablation.   She recently requested a new prescription for a new CPAP machine.  She underwent a repeat CPAP titration to which was unsuccessful due to ongoing respiratory events.   She is  is doing well overall.   She denies any chest pain or pressure, PND, orthopnea, palpitations or syncope. She has been having some problems with SOB but found out 2 months ago that she has asthma.  She is on an inhaler now which has helped.  She has has some LE edema recently as well and her nephrologist increased her Lasix to 40mg  daily.  She has had some problems with dizziness that she describes as being off balance.  She is compliant with her meds and is tolerating meds with no SE.    Past Medical History:  Diagnosis Date  . Anemia   . Arthritis    HIP  . Asthma    as a child  . Atrial flutter [facility])    s/p aflutter ablation at [facility]  . Blood transfusion    never had a reaction to blood transfusion  . Cardiomyopathy Mar 2013   Mild, EF 50-55% by Mar 2013 ECHO, diast dysfxn II  . Closed fracture of right distal radius 10/18/2016  . Constipation    takes Miralax daily  . Depression    takes Zoloft daily  . Distal radius fracture, right   . DVT of lower extremity, bilateral ([place]) 12/21/11   "they're there now; been  there for 2 wks"  . Eczema   . ESRD (end stage renal disease) on dialysis [facility]) 09/2011   07-19-2015 had Kidney transplant at [facility]  . Fractures, stress    in both feet--6 OR 7 YRS AGO--RESOLVED  . Gout    doesn't require meds   . HCAP (healthcare-associated pneumonia)    09/10/11  . Hypercalcemia    09/10/11  . Hypertension    takes Diltiazem daily   . Morbid obesity ([place])   . Nonischemic dilated cardiomyopathy ([place])   . Oligouria   . PAF (paroxysmal atrial fibrillation) (H[facility])     HX OF CEREBRAL BLEED WHILE ON COUMADIN-SO PT NOT ON ANY BLOOD THINNERS NOW  . Peripheral vascular disease ([place])   . Sleep apnea    uses CPAP nightly  . Stroke [facility]) 2009   denies residual, hemorrhagic now off coumadin  . Stroke due to intracerebral hemorrhage ([place]) 2009    Past Surgical History:  Procedure Laterality Date  . AV FISTULA PLACEMENT  09/28/2011   Procedure: ARTERIOVENOUS (AV) FISTULA CREATION;  Surgeon: Rosetta Posner, MD;  Location: [place];  Service: Vascular;  Laterality: Left;  . CARDIAC VALVE SURGERY  1960    FOR ATRIAL SEPTAL DEFECT  . CHOLECYSTECTOMY     1980's  . COLONOSCOPY    . CYSTOSCOPY     many yrs ago  . FEMUR FRACTURE SURGERY  03/2011; 09/03/2011   right; "had 2, 2 wk apart in 2012; broke it again 08/2011 & had OR"  . I&D EXTREMITY  09/15/2011   Procedure: IRRIGATION AND DEBRIDEMENT EXTREMITY w REMOVAL OF HARDWARE;  Surgeon: [name], MD;  Location: [place];  Service: Orthopedics;  Laterality: Right;  . INCISION AND DRAINAGE HIP  12/21/2011   Procedure: IRRIGATION AND DEBRIDEMENT HIP;  Surgeon: [name], MD;  Location: [place];  Service: Orthopedics;  Laterality: Right;  I&D RIGHT HIP WITH PLACEMENT ANTIBIOTIC SPACER  . INSERTION OF DIALYSIS CATHETER     Procedure: INSERTION OF DIALYSIS CATHETER;  Surgeon: Rosetta Posner, MD;  Location: [place];  Service: Vascular;  Laterality: Right;  . IRRIGATION AND DEBRIDEMENT ABSCESS  12/21/11   right hip  . JOINT REPLACEMENT    . KIDNEY  TRANSPLANT  07/19/15  . LAPAROSCOPIC GASTRIC BANDING    . OPEN REDUCTION INTERNAL FIXATION (ORIF) DISTAL RADIAL FRACTURE Right 10/18/2016   Procedure: OPEN REDUCTION INTERNAL FIXATION (ORIF) RIGHT DISTAL RADIAL FRACTURE;  Surgeon: Marchia Bond, MD;  Location: [place];  Service: Orthopedics;  Laterality: Right;  . TOTAL HIP ARTHROPLASTY  02/2011   right THA 02/2011, I&D/removal of hardware 09/2011,, repeat I&D Jun 2013, reimplantation R THA 03-26-2012  . TOTAL HIP REVISION  03/25/2012   Procedure: TOTAL HIP REVISION;  Surgeon: [name], MD;  [facility];  Service: Orthopedics;  Laterality: Right;  Right Total Hip Reimplantation  . VENA CAVA FILTER PLACEMENT  11/2011    Current Medications: Current Meds  Medication Sig  . albuterol (PROVENTIL HFA;VENTOLIN HFA) 108 (90 Base) MCG/ACT inhaler Inhale 2 puffs into the lungs every 6 (six) hours as needed for wheezing or shortness of breath.  Marland Kitchen albuterol (PROVENTIL) (2.5 MG/3ML) 0.083% nebulizer solution Take 3 mLs (2.5 mg total) by nebulization every 6 (six) hours as needed for wheezing or shortness of breath.  Marland Kitchen aspirin EC 81 MG tablet Take 81 mg by mouth.  . carvedilol (COREG) 12.5 MG tablet Take 12.5 mg by mouth 2 (two) times daily with a meal.   . docusate sodium (COLACE) 100 MG capsule Take 100 mg by mouth daily as needed (For constipation.).   Marland Kitchen fluticasone (FLOVENT HFA) 220 MCG/ACT inhaler Inhale 2 puffs into the lungs 2 (two) times daily.  . furosemide (LASIX) 40 MG tablet Take 40 mg by mouth daily. Take 2 tablets by mouth daily (Total 80 mg)  . HYDROcodone-acetaminophen (NORCO) 10-325 MG tablet Take 1-2 tablets by mouth every 6 (six) hours as needed.  . mycophenolate (MYFORTIC) 180 MG EC tablet Take 180 mg by mouth 2 (two) times daily.  . predniSONE (DELTASONE) 10 MG tablet TAKE 3 TABLETS PO QD FOR 3 DAYS THEN TAKE 2 TABLETS PO QD FOR 3 DAYS THEN TAKE 1 TABLET PO QD FOR 3 DAYS THEN TAKE 1/2 TAB PO QD FOR 3 DAYS  .  predniSONE (DELTASONE) 5 MG tablet Take 5 mg by mouth daily with breakfast.  . sertraline (ZOLOFT) 50 MG tablet Take 1 tablet (50 mg total) by mouth daily.  . Tacrolimus (ENVARSUS XR) 1 MG TB24 Take by mouth.  . [DISCONTINUED] diltiazem (CARDIZEM CD) 240 MG 24 hr capsule Take 1 capsule (240 mg total) by mouth daily after breakfast.  Allergies:   Ace inhibitors and Warfarin sodium   Social History   Socioeconomic History  . Marital status: Married    Spouse name: None  . Number of children: None  . Years of education: None  . Highest education level: None  Social Needs  . Financial resource strain: None  . Food insecurity - worry: None  . Food insecurity - inability: None  . Transportation needs - medical: None  . Transportation needs - non-medical: None  Occupational History  . None  Tobacco Use  . Smoking status: Never Smoker  . Smokeless tobacco: Never Used  Substance and Sexual Activity  . Alcohol use: No  . Drug use: No  . Sexual activity: No    Birth control/protection: Post-menopausal  Other Topics Concern  . None  Social History Narrative  . None     Family History: The patient's family history includes Cancer in her father; Diabetes in her mother; Hodgkin's lymphoma (age of onset: 68) in her unknown relative; Hypertension in her mother.  ROS:   Please see the history of present illness.    Review of Systems  Respiratory: Positive for cough, snoring and wheezing.   Musculoskeletal: Positive for back pain.    All other systems reviewed and negative.   EKGs/Labs/Other Studies Reviewed:    The following studies were reviewed today: CPAP titration  EKG:  EKG is  ordered today.  The ekg ordered today demonstrates atrial fibrillation at 97bpm with ILBBB, LVH by voltage and nonspecific ST/T wave abnormality  Recent Labs: 10/17/2016: BUN 14; Creatinine, Ser 0.87; Potassium 4.3; Sodium 141   Recent Lipid Panel    Component Value Date/Time   CHOL   11/16/2008 0520    184        ATP III CLASSIFICATION:  <200     mg/dL   Desirable  200-239  mg/dL   Borderline High  >=240    mg/dL   High          TRIG 157 (H) 09/15/2011 0430   HDL 33 (L) 11/16/2008 0520   CHOLHDL 5.6 11/16/2008 0520   VLDL 21 11/16/2008 0520   LDLCALC (H) 11/16/2008 0520    130        Total Cholesterol/HDL:CHD Risk Coronary Heart Disease Risk Table                     Men   Women  1/2 Average Risk   3.4   3.3  Average Risk       5.0   4.4  2 X Average Risk   9.6   7.1  3 X Average Risk  23.4   11.0        Use the calculated Patient Ratio above and the CHD Risk Table to determine the patient's CHD Risk.        ATP III CLASSIFICATION (LDL):  <100     mg/dL   Optimal  100-129  mg/dL   Near or Above                    Optimal  130-159  mg/dL   Borderline  160-189  mg/dL   High  >190     mg/dL   Very High    Physical Exam:    VS:  BP 132/84   Pulse 84   Ht 5\' 5"  (1.651 m)   Wt 249 lb 12.8 oz (113.3 kg)   BMI 41.57 kg/m     Wt  Readings from Last 3 Encounters:  07/26/17 249 lb 12.8 oz (113.3 kg)  07/19/17 252 lb 12.8 oz (114.7 kg)  06/30/17 232 lb (105.2 kg)     GEN:  Well nourished, well developed in no acute distress HEENT: Normal NECK: No JVD; No carotid bruits LYMPHATICS: No lymphadenopathy CARDIAC: irregularly irregular, no murmurs, rubs, gallops RESPIRATORY:  Clear to auscultation without rales, wheezing or rhonchi  ABDOMEN: Soft, non-tender, non-distended MUSCULOSKELETAL:  No edema; No deformity  SKIN: Warm and dry NEUROLOGIC:  Alert and oriented x 3 PSYCHIATRIC:  Normal affect   ASSESSMENT:    1. Nonischemic dilated cardiomyopathy ([place])   2. Essential hypertension   3. OSA on CPAP   4. PAF (paroxysmal atrial fibrillation) ([name])   5. Typical atrial flutter ([place])   6. Shortness of breath    PLAN:    In order of problems listed above:  1.  Nonischemic DCM - her last EF assessment by echo was 2014 showing an EF of 50-55%.  Continue Lasix 40mg  daily for her LE edema.   2.  HTN - BP is well controlled on exam today.  She will continue on Carvedilol 12.5mg  BID and Cardizem CD.  3.  OSA - we discussed the recent CPAP titration and the problem with inadequate CPAP response due to no REM sleep as well as ongoing hypoxia.  I have recommended that we start her on auto CPAP with ResMed device and mask of choice and then get an overnight pulse ox on auto CPAP to determine if supplemental O2 will be needed.     4.  PAF - she is now back in atrial fibrillation with borderline rate control.  She will continue on Carvedilol 12.5mg  BID and increase Cardizem CD to 300mg  daily  for better heart rate control.  Not a long term anticoagulation candidate due to h/o cerebral bleed.  I am going to refer her to afib clinic for further recommendations.  Unsure if she would be a candidate for watchman device.   5.  SOB - she has been having SOB recently that I suspect is due in part to recurrent afib.  She was diagnosed recently with asthma and symptoms have improved some with inhalers.  Since she has had more LE edema recently I will get an echo to make sure LVF is still preserved.  Medication Adjustments/Labs and Tests Ordered: Current medicines are reviewed at length with the patient today.  Concerns regarding medicines are outlined above.  No orders of the defined types were placed in this encounter.  No orders of the defined types were placed in this encounter.   Signed, Fransico Him, MD  07/26/2017 11:11 AM    [place]

## 2017-07-26 ENCOUNTER — Encounter (HOSPITAL_COMMUNITY): Payer: Self-pay | Admitting: Nurse Practitioner

## 2017-07-26 ENCOUNTER — Encounter: Payer: Self-pay | Admitting: Cardiology

## 2017-07-26 ENCOUNTER — Ambulatory Visit (INDEPENDENT_AMBULATORY_CARE_PROVIDER_SITE_OTHER): Payer: Medicare Other | Admitting: Cardiology

## 2017-07-26 ENCOUNTER — Ambulatory Visit (HOSPITAL_COMMUNITY)
Admission: RE | Admit: 2017-07-26 | Discharge: 2017-07-26 | Disposition: A | Discharge status: Home / Self Care | Payer: Medicare Other | Source: Ambulatory Visit | Attending: Nurse Practitioner | Admitting: Nurse Practitioner

## 2017-07-26 VITALS — BP 138/76 | HR 76 | Ht 65.0 in | Wt 249.0 lb

## 2017-07-26 VITALS — BP 132/84 | HR 84 | Ht 65.0 in | Wt 249.8 lb

## 2017-07-26 DIAGNOSIS — I480 Paroxysmal atrial fibrillation: Secondary | ICD-10-CM | POA: Insufficient documentation

## 2017-07-26 DIAGNOSIS — F329 Major depressive disorder, single episode, unspecified: Secondary | ICD-10-CM | POA: Diagnosis not present

## 2017-07-26 DIAGNOSIS — I4892 Unspecified atrial flutter: Secondary | ICD-10-CM | POA: Insufficient documentation

## 2017-07-26 DIAGNOSIS — Z96641 Presence of right artificial hip joint: Secondary | ICD-10-CM | POA: Insufficient documentation

## 2017-07-26 DIAGNOSIS — Z7982 Long term (current) use of aspirin: Secondary | ICD-10-CM | POA: Insufficient documentation

## 2017-07-26 DIAGNOSIS — R0600 Dyspnea, unspecified: Secondary | ICD-10-CM | POA: Insufficient documentation

## 2017-07-26 DIAGNOSIS — I483 Typical atrial flutter: Secondary | ICD-10-CM

## 2017-07-26 DIAGNOSIS — J45909 Unspecified asthma, uncomplicated: Secondary | ICD-10-CM | POA: Diagnosis not present

## 2017-07-26 DIAGNOSIS — I739 Peripheral vascular disease, unspecified: Secondary | ICD-10-CM | POA: Insufficient documentation

## 2017-07-26 DIAGNOSIS — I420 Dilated cardiomyopathy: Secondary | ICD-10-CM | POA: Insufficient documentation

## 2017-07-26 DIAGNOSIS — Z9889 Other specified postprocedural states: Secondary | ICD-10-CM | POA: Diagnosis not present

## 2017-07-26 DIAGNOSIS — Z79899 Other long term (current) drug therapy: Secondary | ICD-10-CM | POA: Diagnosis not present

## 2017-07-26 DIAGNOSIS — G4733 Obstructive sleep apnea (adult) (pediatric): Secondary | ICD-10-CM

## 2017-07-26 DIAGNOSIS — M109 Gout, unspecified: Secondary | ICD-10-CM | POA: Diagnosis not present

## 2017-07-26 DIAGNOSIS — Z9989 Dependence on other enabling machines and devices: Secondary | ICD-10-CM

## 2017-07-26 DIAGNOSIS — Z8673 Personal history of transient ischemic attack (TIA), and cerebral infarction without residual deficits: Secondary | ICD-10-CM | POA: Diagnosis not present

## 2017-07-26 DIAGNOSIS — Z940 Kidney transplant status: Secondary | ICD-10-CM | POA: Insufficient documentation

## 2017-07-26 DIAGNOSIS — Z86718 Personal history of other venous thrombosis and embolism: Secondary | ICD-10-CM | POA: Diagnosis not present

## 2017-07-26 DIAGNOSIS — R0602 Shortness of breath: Secondary | ICD-10-CM

## 2017-07-26 DIAGNOSIS — I10 Essential (primary) hypertension: Secondary | ICD-10-CM | POA: Diagnosis not present

## 2017-07-26 MED ORDER — DILTIAZEM HCL ER COATED BEADS 300 MG PO CP24: 300.0000 mg | ORAL_CAPSULE | Freq: Every day | ORAL | 3 refills | Status: DC

## 2017-07-26 NOTE — Patient Instructions (Addendum)
Medication Instructions:  Your physician has recommended you make the following change in your medication  INCREASE: cardizem 300 mg once a day   Labwork: None Ordered   Testing/Procedures: Your physician has requested that you have an echocardiogram. Echocardiography is a painless test that uses sound waves to create images of your heart. It provides your doctor with information about the size and shape of your heart and how well your heart's chambers and valves are working. This procedure takes approximately one hour. There are no restrictions for this procedure.   Follow-Up: Your physician recommends that you schedule a follow-up appointment in 10 weeks with Dr. Radford Pax  Your physician recommends that you go to the AFIB clinic to be evaluated today: The parking code is [id number]   Any Other Special Instructions Will Be Listed Below (If Applicable).     If you need a refill on your cardiac medications before your next appointment, please call your pharmacy.

## 2017-07-26 NOTE — Progress Notes (Signed)
Primary Care Physician: Carollee Herter, Alferd Apa, DO Referring Physician: Dr. Ashok Norris   [name] is a 72 y.o. female with a h/o hypertensive DCM, HTN, OSA, s/p renal transplant, s/p cerebral bleed, morbid obesity and PAF. She was seen earlier this am by Radford Pax, she had not seen pt in 3 years. . Since I saw her she has had a renal transplant which was complicated by new onset atrial flutter and she underwent aflutter ablation.   She recently requested a new prescription for a new CPAP machine.  She underwent a repeat CPAP titration to which was unsuccessful due to ongoing respiratory events.   Pt was found to be in asymptomatic afib with reasonable rate control. She has been placed on a prednisone taper for an asthma flare. Recently diagnosed with asthma. She has 2 days of the taper left. She is not on anticoagulation 2/2 h/o cerebral bleed. She does have h/o aflutter ablation. She is on baby asa. She does not smoke, use alcohol, no significant caffeine.  Today, she denies symptoms of palpitations, chest pain, shortness of breath, orthopnea, PND, lower extremity edema, dizziness, presyncope, syncope, or neurologic sequela. The patient is tolerating medications without difficulties and is otherwise without complaint today.   Past Medical History:  Diagnosis Date  . Anemia   . Arthritis    HIP  . Asthma    as a child  . Atrial flutter [facility])    s/p aflutter ablation at [facility]  . Blood transfusion    never had a reaction to blood transfusion  . Cardiomyopathy Mar 2013   Mild, EF 50-55% by Mar 2013 ECHO, diast dysfxn II  . Closed fracture of right distal radius 10/18/2016  . Constipation    takes Miralax daily  . Depression    takes Zoloft daily  . Distal radius fracture, right   . DVT of lower extremity, bilateral ([place]) 12/21/11   "they're there now; been there for 2 wks"  . Eczema   . ESRD (end stage renal disease) on dialysis [facility]) 09/2011   07-19-2015 had Kid[facility] at [facility]    . Fractures, stress    in both feet--6 OR 7 YRS AGO--RESOLVED  . Gout    doesn't require meds   . HCAP (healthcare-associated pneumonia)    09/10/11  . Hypercalcemia    09/10/11  . Hypertension    takes Diltiazem daily   . Morbid obesity ([place])   . Nonischemic dilated cardiomyopathy ([place])   . Oligouria   . PAF (paroxysmal atrial fibrillation) (H[facility])     HX OF CEREBRAL BLEED WHILE ON COUMADIN-SO PT NOT ON ANY BLOOD THINNERS NOW  . Peripheral vascular disease ([place])   . Sleep apnea    uses CPAP nightly  . Stroke [facility]) 2009   denies residual, hemorrhagic now off coumadin  . Stroke due to intracerebral hemorrhage ([place]) 2009   Past Surgical History:  Procedure Laterality Date  . AV FISTULA PLACEMENT  09/28/2011   Procedure: ARTERIOVENOUS (AV) FISTULA CREATION;  Surgeon: Rosetta Posner, MD;  Location: [place];  Service: Vascular;  Laterality: Left;  . CARDIAC VALVE SURGERY  1960    FOR ATRIAL SEPTAL DEFECT  . CHOLECYSTECTOMY     1980's  . COLONOSCOPY    . CYSTOSCOPY     many yrs ago  . FEMUR FRACTURE SURGERY  03/2011; 09/03/2011   right; "had 2, 2 wk apart in 2012; broke it again 08/2011 & had OR"  . I&D EXTREMITY  09/15/2011   Procedure: IRRIGATION AND DEBRIDEMENT EXTREMITY w REMOVAL OF HARDWARE;  Surgeon: [name], MD;  Location: [place];  Service: Orthopedics;  Laterality: Right;  . INCISION AND DRAINAGE HIP  12/21/2011   Procedure: IRRIGATION AND DEBRIDEMENT HIP;  Surgeon: [name], MD;  Location: [place];  Service: Orthopedics;  Laterality: Right;  I&D RIGHT HIP WITH PLACEMENT ANTIBIOTIC SPACER  . INSERTION OF DIALYSIS CATHETER     Procedure: INSERTION OF DIALYSIS CATHETER;  Surgeon: Rosetta Posner, MD;  Location: [place];  Service: Vascular;  Laterality: Right;  . IRRIGATION AND DEBRIDEMENT ABSCESS  12/21/11   right hip  . JOINT REPLACEMENT    . KIDNEY TRANSPLANT  07/19/15  . LAPAROSCOPIC GASTRIC BANDING    . OPEN REDUCTION INTERNAL FIXATION (ORIF) DISTAL RADIAL FRACTURE Right 10/18/2016    Procedure: OPEN REDUCTION INTERNAL FIXATION (ORIF) RIGHT DISTAL RADIAL FRACTURE;  Surgeon: Marchia Bond, MD;  Location: [place];  Service: Orthopedics;  Laterality: Right;  . TOTAL HIP ARTHROPLASTY  02/2011   right THA 02/2011, I&D/removal of hardware 09/2011,, repeat I&D Jun 2013, reimplantation R THA 03-26-2012  . TOTAL HIP REVISION  03/25/2012   Procedure: TOTAL HIP REVISION;  Surgeon: [name], MD;  [facility];  Service: Orthopedics;  Laterality: Right;  Right Total Hip Reimplantation  . VENA CAVA FILTER PLACEMENT  11/2011    Current Outpatient Medications  Medication Sig Dispense Refill  . albuterol (PROVENTIL HFA;VENTOLIN HFA) 108 (90 Base) MCG/ACT inhaler Inhale 2 puffs into the lungs every 6 (six) hours as needed for wheezing or shortness of breath. 18 g 5  . albuterol (PROVENTIL) (2.5 MG/3ML) 0.083% nebulizer solution Take 3 mLs (2.5 mg total) by nebulization every 6 (six) hours as needed for wheezing or shortness of breath. 75 mL 12  . aspirin EC 81 MG tablet Take 81 mg by mouth.    . carvedilol (COREG) 12.5 MG tablet Take 12.5 mg by mouth 2 (two) times daily with a meal.     . diltiazem (CARDIZEM CD) 300 MG 24 hr capsule Take 1 capsule (300 mg total) by mouth daily. 90 capsule 3  . docusate sodium (COLACE) 100 MG capsule Take 100 mg by mouth daily as needed (For constipation.).     Marland Kitchen fluticasone (FLOVENT HFA) 220 MCG/ACT inhaler Inhale 2 puffs into the lungs 2 (two) times daily. 1 Inhaler 5  . furosemide (LASIX) 40 MG tablet Take 40 mg by mouth daily. Take 2 tablets by mouth daily (Total 80 mg)    . HYDROcodone-acetaminophen (NORCO) 10-325 MG tablet Take 1-2 tablets by mouth every 6 (six) hours as needed. 50 tablet 0  . mycophenolate (MYFORTIC) 180 MG EC tablet Take 180 mg by mouth 2 (two) times daily.    . predniSONE (DELTASONE) 10 MG tablet TAKE 3 TABLETS PO QD FOR 3 DAYS THEN TAKE 2 TABLETS PO QD FOR 3 DAYS THEN TAKE 1 TABLET PO QD FOR 3 DAYS THEN TAKE  1/2 TAB PO QD FOR 3 DAYS 20 tablet 0  . predniSONE (DELTASONE) 5 MG tablet Take 5 mg by mouth daily with breakfast.    . sertraline (ZOLOFT) 50 MG tablet Take 1 tablet (50 mg total) by mouth daily. 90 tablet 3  . Tacrolimus (ENVARSUS XR) 1 MG TB24 Take by mouth.     No current facility-administered medications for this encounter.     Allergies  Allergen Reactions  . Ace Inhibitors Other (See Comments)    Reaction unknown  .  Warfarin Sodium Other (See Comments)    Caused her to have a stroke    Social History   Socioeconomic History  . Marital status: Married    Spouse name: Not on file  . Number of children: Not on file  . Years of education: Not on file  . Highest education level: Not on file  Social Needs  . Financial resource strain: Not on file  . Food insecurity - worry: Not on file  . Food insecurity - inability: Not on file  . Transportation needs - medical: Not on file  . Transportation needs - non-medical: Not on file  Occupational History  . Not on file  Tobacco Use  . Smoking status: Never Smoker  . Smokeless tobacco: Never Used  Substance and Sexual Activity  . Alcohol use: No  . Drug use: No  . Sexual activity: No    Birth control/protection: Post-menopausal  Other Topics Concern  . Not on file  Social History Narrative  . Not on file    Family History  Problem Relation Age of Onset  . Cancer Father   . Diabetes Mother   . Hypertension Mother   . Hodgkin's lymphoma Unknown 32       dscd---HODGKINS DISEASE    ROS- All systems are reviewed and negative except as per the HPI above  Physical Exam: Vitals:   07/26/17 1159  BP: 138/76  Pulse: 76  SpO2: 92%  Weight: 249 lb (112.9 kg)  Height: 5\' 5"  (1.651 m)   Wt Readings from Last 3 Encounters:  07/26/17 249 lb (112.9 kg)  07/26/17 249 lb 12.8 oz (113.3 kg)  07/19/17 252 lb 12.8 oz (114.7 kg)    Labs: Lab Results  Component Value Date   NA 141 10/17/2016   K 4.3 10/17/2016   CL 104  10/17/2016   CO2 28 10/17/2016   GLUCOSE 95 10/17/2016   BUN 14 10/17/2016   CREATININE 0.87 10/17/2016   CALCIUM 9.8 10/17/2016   PHOS 4.8 (H) 03/31/2012   MG 2.0 09/13/2011   Lab Results  Component Value Date   INR 0.98 03/25/2012   Lab Results  Component Value Date   CHOL  11/16/2008    184        ATP III CLASSIFICATION:  <200     mg/dL   Desirable  200-239  mg/dL   Borderline High  >=240    mg/dL   High          HDL 33 (L) 11/16/2008   LDLCALC (H) 11/16/2008    130        Total Cholesterol/HDL:CHD Risk Coronary Heart Disease Risk Table                     Men   Women  1/2 Average Risk   3.4   3.3  Average Risk       5.0   4.4  2 X Average Risk   9.6   7.1  3 X Average Risk  23.4   11.0        Use the calculated Patient Ratio above and the CHD Risk Table to determine the patient's CHD Risk.        ATP III CLASSIFICATION (LDL):  <100     mg/dL   Optimal  100-129  mg/dL   Near or Above                    Optimal  130-159  mg/dL   Borderline  160-189  mg/dL   High  >190     mg/dL   Very High   TRIG 157 (H) 09/15/2011     GEN- The patient is well appearing, alert and oriented x 3 today.   Head- normocephalic, atraumatic Eyes-  Sclera clear, conjunctiva pink Ears- hearing intact Oropharynx- clear Neck- supple, no JVP Lymph- no cervical lymphadenopathy Lungs- Clear to ausculation bilaterally, normal work of breathing Heart- irregular rate and rhythm, no murmurs, rubs or gallops, PMI not laterally displaced GI- soft, NT, ND, + BS Extremities- no clubbing, cyanosis, or edema MS- no significant deformity or atrophy Skin- no rash or lesion Psych- euthymic mood, full affect Neuro- strength and sensation are intact  EKG-afib at 97 bpm, qrs int 110 ms, qtc 449 ms, ILBBB Epic records reviewed      Assessment and Plan: 1. PAF- Pt is symptomatic with reasonable rate control, ? Onset as pt does not feel symptoms Continue with plans to increase Cardizem per  Dr. Radford Pax to 300 mg a day Continue with metoprolol  Continue with ASA as pt with h/o cerebral bleed Finish prednisone taper  Cpap for OSA as per Dr. Radford Pax, stressed use of cpap to help with afib burden  F/u in afib clinic next week  Butch Penny C. Bodhi Stenglein, [facility] [address] [place], [place] [address] [phone]

## 2017-07-29 DIAGNOSIS — Z6841 Body Mass Index (BMI) 40.0 and over, adult: Secondary | ICD-10-CM | POA: Diagnosis not present

## 2017-07-29 DIAGNOSIS — N261 Atrophy of kidney (terminal): Secondary | ICD-10-CM | POA: Diagnosis not present

## 2017-07-29 DIAGNOSIS — Z940 Kidney transplant status: Secondary | ICD-10-CM | POA: Diagnosis not present

## 2017-07-29 DIAGNOSIS — Z7952 Long term (current) use of systemic steroids: Secondary | ICD-10-CM | POA: Diagnosis not present

## 2017-07-29 DIAGNOSIS — D899 Disorder involving the immune mechanism, unspecified: Secondary | ICD-10-CM | POA: Diagnosis not present

## 2017-07-29 DIAGNOSIS — Z4822 Encounter for aftercare following kidney transplant: Secondary | ICD-10-CM | POA: Diagnosis not present

## 2017-07-29 DIAGNOSIS — Z5181 Encounter for therapeutic drug level monitoring: Secondary | ICD-10-CM | POA: Diagnosis not present

## 2017-07-29 DIAGNOSIS — R808 Other proteinuria: Secondary | ICD-10-CM | POA: Diagnosis not present

## 2017-07-29 DIAGNOSIS — N269 Renal sclerosis, unspecified: Secondary | ICD-10-CM | POA: Diagnosis not present

## 2017-07-29 DIAGNOSIS — Z79899 Other long term (current) drug therapy: Secondary | ICD-10-CM | POA: Diagnosis not present

## 2017-07-29 DIAGNOSIS — R809 Proteinuria, unspecified: Secondary | ICD-10-CM | POA: Diagnosis not present

## 2017-07-29 DIAGNOSIS — E669 Obesity, unspecified: Secondary | ICD-10-CM | POA: Diagnosis not present

## 2017-07-29 DIAGNOSIS — I10 Essential (primary) hypertension: Secondary | ICD-10-CM | POA: Diagnosis not present

## 2017-07-29 DIAGNOSIS — G4733 Obstructive sleep apnea (adult) (pediatric): Secondary | ICD-10-CM | POA: Diagnosis not present

## 2017-07-29 DIAGNOSIS — R801 Persistent proteinuria, unspecified: Secondary | ICD-10-CM | POA: Diagnosis not present

## 2017-07-29 DIAGNOSIS — Z01818 Encounter for other preprocedural examination: Secondary | ICD-10-CM | POA: Diagnosis not present

## 2017-07-31 ENCOUNTER — Ambulatory Visit (HOSPITAL_COMMUNITY)
Admission: RE | Admit: 2017-07-31 | Discharge: 2017-07-31 | Disposition: A | Discharge status: Home / Self Care | Payer: Medicare Other | Source: Ambulatory Visit | Attending: Nurse Practitioner | Admitting: Nurse Practitioner

## 2017-07-31 ENCOUNTER — Telehealth: Payer: Self-pay | Admitting: Cardiology

## 2017-07-31 ENCOUNTER — Encounter (HOSPITAL_COMMUNITY): Payer: Self-pay | Admitting: Nurse Practitioner

## 2017-07-31 VITALS — BP 146/82 | HR 84 | Ht 65.0 in | Wt 246.2 lb

## 2017-07-31 DIAGNOSIS — I4892 Unspecified atrial flutter: Secondary | ICD-10-CM | POA: Insufficient documentation

## 2017-07-31 DIAGNOSIS — I4891 Unspecified atrial fibrillation: Secondary | ICD-10-CM | POA: Insufficient documentation

## 2017-07-31 DIAGNOSIS — I447 Left bundle-branch block, unspecified: Secondary | ICD-10-CM | POA: Insufficient documentation

## 2017-07-31 DIAGNOSIS — I120 Hypertensive chronic kidney disease with stage 5 chronic kidney disease or end stage renal disease: Secondary | ICD-10-CM | POA: Diagnosis not present

## 2017-07-31 DIAGNOSIS — Z9889 Other specified postprocedural states: Secondary | ICD-10-CM | POA: Insufficient documentation

## 2017-07-31 DIAGNOSIS — Z86718 Personal history of other venous thrombosis and embolism: Secondary | ICD-10-CM | POA: Diagnosis not present

## 2017-07-31 DIAGNOSIS — I481 Persistent atrial fibrillation: Secondary | ICD-10-CM | POA: Insufficient documentation

## 2017-07-31 DIAGNOSIS — N186 End stage renal disease: Secondary | ICD-10-CM | POA: Diagnosis not present

## 2017-07-31 DIAGNOSIS — G4733 Obstructive sleep apnea (adult) (pediatric): Secondary | ICD-10-CM | POA: Diagnosis not present

## 2017-07-31 DIAGNOSIS — I480 Paroxysmal atrial fibrillation: Secondary | ICD-10-CM | POA: Diagnosis not present

## 2017-07-31 DIAGNOSIS — Z940 Kidney transplant status: Secondary | ICD-10-CM | POA: Insufficient documentation

## 2017-07-31 DIAGNOSIS — Z992 Dependence on renal dialysis: Secondary | ICD-10-CM | POA: Insufficient documentation

## 2017-07-31 NOTE — Telephone Encounter (Signed)
Walk in pt Form-Sealed Envelope dropped off placed in Dr.Turner doc box.

## 2017-07-31 NOTE — Progress Notes (Addendum)
Primary Care Physician: Carollee Herter, Alferd Apa, DO Referring Physician: Dr. Ashok Norris   NADINE RYLE is a 72 y.o. female with a h/o hypertensive DCM, HTN, OSA, s/p renal transplant, s/p cerebral  Bleed, 11/2010, morbid obesity, s/p DVT/IVC filter and paroxysmal afib for years. She was recently seen by Dr.  Radford Pax, she had not seen pt in 3 years. Renal transplant, 67/6720, which was complicated by new onset atrial flutter and she underwent aflutter ablation.  Was on limited anticoagulation just to get thru ablation, but records indicate from hospitalization for  [place], 2012, per neurologist  that she is never take anticoagulation again. She was on coumadin with the prior bleed. She is tolerating a baby asa daily.   Pt was seen in the afib cllinic from Dr. Theodosia Blender office as urgent work in. Found to be in asymptomatic afib with reasonable rate control. She had been placed on a prednisone taper for an asthma flare. Recently diagnosed with asthma,  states new shortness of breath for 3-4 weeks, ? possibly afib contributing to symptoms of shortness of breath. She does not smoke, use alcohol, no significant caffeine.  Today, she denies symptoms of palpitations, chest pain, + for  shortness of breath, no orthopnea, PND, lower extremity edema, dizziness, presyncope, syncope, or neurologic sequela. The patient is tolerating medications without difficulties and is otherwise without complaint today.   Past Medical History:  Diagnosis Date  . Anemia   . Arthritis    HIP  . Asthma    as a child  . Atrial flutter [facility])    s/p aflutter ablation at [facility]  . Blood transfusion    never had a reaction to blood transfusion  . Cardiomyopathy Mar 2013   Mild, EF 50-55% by Mar 2013 ECHO, diast dysfxn II  . Closed fracture of right distal radius 10/18/2016  . Constipation    takes Miralax daily  . Depression    takes Zoloft daily  . Distal radius fracture, right   . DVT of lower extremity, bilateral ([place]) 12/21/11     "they're there now; been there for 2 wks"  . Eczema   . ESRD (end stage renal disease) on dialysis [facility]) 09/2011   07-19-2015 had Kidney transplant at [facility]  . Fractures, stress    in both feet--6 OR 7 YRS AGO--RESOLVED  . Gout    doesn't require meds   . HCAP (healthcare-associated pneumonia)    09/10/11  . Hypercalcemia    09/10/11  . Hypertension    takes Diltiazem daily   . Morbid obesity ([place])   . Nonischemic dilated cardiomyopathy ([place])   . Oligouria   . PAF (paroxysmal atrial fibrillation) (H[facility])     HX OF CEREBRAL BLEED WHILE ON COUMADIN-SO PT NOT ON ANY BLOOD THINNERS NOW  . Peripheral vascular disease ([place])   . Sleep apnea    uses CPAP nightly  . Stroke [facility]) 2009   denies residual, hemorrhagic now off coumadin  . Stroke due to intracerebral hemorrhage ([place]) 2009   Past Surgical History:  Procedure Laterality Date  . AV FISTULA PLACEMENT  09/28/2011   Procedure: ARTERIOVENOUS (AV) FISTULA CREATION;  Surgeon: Rosetta Posner, MD;  Location: [place];  Service: Vascular;  Laterality: Left;  . CARDIAC VALVE SURGERY  1960    FOR ATRIAL SEPTAL DEFECT  . CHOLECYSTECTOMY     1980's  . COLONOSCOPY    . CYSTOSCOPY     many yrs ago  . FEMUR FRACTURE SURGERY  03/2011;  09/03/2011   right; "had 2, 2 wk apart in 2012; broke it again 08/2011 & had OR"  . I&D EXTREMITY  09/15/2011   Procedure: IRRIGATION AND DEBRIDEMENT EXTREMITY w REMOVAL OF HARDWARE;  Surgeon: [name], MD;  Location: [place];  Service: Orthopedics;  Laterality: Right;  . INCISION AND DRAINAGE HIP  12/21/2011   Procedure: IRRIGATION AND DEBRIDEMENT HIP;  Surgeon: [name], MD;  Location: [place];  Service: Orthopedics;  Laterality: Right;  I&D RIGHT HIP WITH PLACEMENT ANTIBIOTIC SPACER  . INSERTION OF DIALYSIS CATHETER     Procedure: INSERTION OF DIALYSIS CATHETER;  Surgeon: Rosetta Posner, MD;  Location: [place];  Service: Vascular;  Laterality: Right;  . IRRIGATION AND DEBRIDEMENT ABSCESS  12/21/11   right hip  .  JOINT REPLACEMENT    . KIDNEY TRANSPLANT  07/19/15  . LAPAROSCOPIC GASTRIC BANDING    . OPEN REDUCTION INTERNAL FIXATION (ORIF) DISTAL RADIAL FRACTURE Right 10/18/2016   Procedure: OPEN REDUCTION INTERNAL FIXATION (ORIF) RIGHT DISTAL RADIAL FRACTURE;  Surgeon: Marchia Bond, MD;  Location: [place];  Service: Orthopedics;  Laterality: Right;  . TOTAL HIP ARTHROPLASTY  02/2011   right THA 02/2011, I&D/removal of hardware 09/2011,, repeat I&D Jun 2013, reimplantation R THA 03-26-2012  . TOTAL HIP REVISION  03/25/2012   Procedure: TOTAL HIP REVISION;  Surgeon: [name], MD;  Location: [facility];  Service: Orthopedics;  Laterality: Right;  Right Total Hip Reimplantation  . VENA CAVA FILTER PLACEMENT  11/2011    Current Outpatient Medications  Medication Sig Dispense Refill  . albuterol (PROVENTIL HFA;VENTOLIN HFA) 108 (90 Base) MCG/ACT inhaler Inhale 2 puffs into the lungs every 6 (six) hours as needed for wheezing or shortness of breath. 18 g 5  . albuterol (PROVENTIL) (2.5 MG/3ML) 0.083% nebulizer solution Take 3 mLs (2.5 mg total) by nebulization every 6 (six) hours as needed for wheezing or shortness of breath. 75 mL 12  . aspirin EC 81 MG tablet Take 81 mg by mouth.    . carvedilol (COREG) 12.5 MG tablet Take 12.5 mg by mouth 2 (two) times daily with a meal.     . diltiazem (CARDIZEM CD) 300 MG 24 hr capsule Take 1 capsule (300 mg total) by mouth daily. 90 capsule 3  . docusate sodium (COLACE) 100 MG capsule Take 100 mg by mouth daily as needed (For constipation.).     Marland Kitchen fluticasone (FLOVENT HFA) 220 MCG/ACT inhaler Inhale 2 puffs into the lungs 2 (two) times daily. 1 Inhaler 5  . furosemide (LASIX) 40 MG tablet Take 40 mg by mouth daily. Take 2 tablets by mouth daily (Total 80 mg)    . HYDROcodone-acetaminophen (NORCO) 10-325 MG tablet Take 1-2 tablets by mouth every 6 (six) hours as needed. 50 tablet 0  . mycophenolate (MYFORTIC) 180 MG EC tablet Take 180 mg by mouth 2 (two) times  daily.    . predniSONE (DELTASONE) 10 MG tablet TAKE 3 TABLETS PO QD FOR 3 DAYS THEN TAKE 2 TABLETS PO QD FOR 3 DAYS THEN TAKE 1 TABLET PO QD FOR 3 DAYS THEN TAKE 1/2 TAB PO QD FOR 3 DAYS 20 tablet 0  . predniSONE (DELTASONE) 5 MG tablet Take 5 mg by mouth daily with breakfast.    . sertraline (ZOLOFT) 50 MG tablet Take 1 tablet (50 mg total) by mouth daily. 90 tablet 3  . Tacrolimus (ENVARSUS XR) 1 MG TB24 Take by mouth.     No current facility-administered medications  for this encounter.     Allergies  Allergen Reactions  . Ace Inhibitors Other (See Comments)    Reaction unknown  . Warfarin Sodium Other (See Comments)    Caused her to have a stroke    Social History   Socioeconomic History  . Marital status: Married    Spouse name: Not on file  . Number of children: Not on file  . Years of education: Not on file  . Highest education level: Not on file  Social Needs  . Financial resource strain: Not on file  . Food insecurity - worry: Not on file  . Food insecurity - inability: Not on file  . Transportation needs - medical: Not on file  . Transportation needs - non-medical: Not on file  Occupational History  . Not on file  Tobacco Use  . Smoking status: Never Smoker  . Smokeless tobacco: Never Used  Substance and Sexual Activity  . Alcohol use: No  . Drug use: No  . Sexual activity: No    Birth control/protection: Post-menopausal  Other Topics Concern  . Not on file  Social History Narrative  . Not on file    Family History  Problem Relation Age of Onset  . Cancer Father   . Diabetes Mother   . Hypertension Mother   . Hodgkin's lymphoma Unknown 32       dscd---HODGKINS DISEASE    ROS- All systems are reviewed and negative except as per the HPI above  Physical Exam: Vitals:   07/31/17 0840  BP: (!) 146/82  Pulse: 84  Weight: 246 lb 3.2 oz (111.7 kg)  Height: 5\' 5"  (1.651 m)   Wt Readings from Last 3 Encounters:  07/31/17 246 lb 3.2 oz (111.7 kg)    07/26/17 249 lb (112.9 kg)  07/26/17 249 lb 12.8 oz (113.3 kg)    Labs: Lab Results  Component Value Date   NA 141 10/17/2016   K 4.3 10/17/2016   CL 104 10/17/2016   CO2 28 10/17/2016   GLUCOSE 95 10/17/2016   BUN 14 10/17/2016   CREATININE 0.87 10/17/2016   CALCIUM 9.8 10/17/2016   PHOS 4.8 (H) 03/31/2012   MG 2.0 09/13/2011   Lab Results  Component Value Date   INR 0.98 03/25/2012   Lab Results  Component Value Date   CHOL  11/16/2008    184        ATP III CLASSIFICATION:  <200     mg/dL   Desirable  200-239  mg/dL   Borderline High  >=240    mg/dL   High          HDL 33 (L) 11/16/2008   LDLCALC (H) 11/16/2008    130        Total Cholesterol/HDL:CHD Risk Coronary Heart Disease Risk Table                     Men   Women  1/2 Average Risk   3.4   3.3  Average Risk       5.0   4.4  2 X Average Risk   9.6   7.1  3 X Average Risk  23.4   11.0        Use the calculated Patient Ratio above and the CHD Risk Table to determine the patient's CHD Risk.        ATP III CLASSIFICATION (LDL):  <100     mg/dL   Optimal  100-129  mg/dL  Near or Above                    Optimal  130-159  mg/dL   Borderline  160-189  mg/dL   High  >190     mg/dL   Very High   TRIG 157 (H) 09/15/2011     GEN- The patient is well appearing, alert and oriented x 3 today.   Head- normocephalic, atraumatic Eyes-  Sclera clear, conjunctiva pink Ears- hearing intact Oropharynx- clear Neck- supple, no JVP Lymph- no cervical lymphadenopathy Lungs- Clear to ausculation bilaterally, normal work of breathing Heart- irregular rate and rhythm, no murmurs, rubs or gallops, PMI not laterally displaced GI- soft, NT, ND, + BS Extremities- no clubbing, cyanosis, or edema MS- no significant deformity or atrophy Skin- no rash or lesion Psych- euthymic mood, full affect Neuro- strength and sensation are intact  EKG-afib at 84 bpm, qrs int 106 ms, qtc 432 ms, ILBBB Epic records  reviewed   Assessment and Plan: 1. Atrial fibrillation Paroxysmal in the past for years but now seems to be persisitent Questioning  If recent shortness of breath/ newdx asthma may be related to afib She is rate controlled and will continue with current Cardizem at  300 mg a day Continue with carvedilol Continue with ASA as pt with h/o cerebral bleed, per records in Epic at time of ICH in 2012, she was told to be off anticoagulation forever This is a very complicated pt and I do not see a route to get her back in SR without use of anticoagulation I am not sure if she would be a watchman candidate as she would have to take anticoagualtion for several months and she has has an IVC filter which may complicate the approach Cpap for OSA as per Dr. Radford Pax, stressed use of cpap to help with afib burden  I will have to discuss with Dr. Ky Barban to see best way to treat afib but do not see a viable plan to restore SR  I will notify pt when I have discussed with him She had her aflutter ablation in [place] with Dr. Joan Mayans, in 2017, where she also has her transplant care and received her IVC They also have a Watchman program It may best to refer back there  Addendum: 1/17- I reviewed pt's history with Dr. Rayann Heman. He feels that pt should return to Dr. Joan Mayans since he did her aflutter ablation in 2017 and they have an active Watchman program since pt cannot take  longterm anticoagulation that would be necessary to try to restore SR or even reduce her risk of stroke. I informed the pt and husband and gave them Dr. Joan Mayans name and office number to call and get an appointment.    Geroge Baseman Treyvion Durkee, [facility] [address] [place], [place] [address] [phone]

## 2017-08-01 ENCOUNTER — Telehealth: Payer: Self-pay | Admitting: Cardiology

## 2017-08-01 NOTE — Addendum Note (Signed)
Encounter addended by: Sherran Needs, NP on: 08/01/2017 3:41 PM  Actions taken: Sign clinical note

## 2017-08-01 NOTE — Telephone Encounter (Signed)
New Message  Pt husband call to f/u up on pt receiving a cpap machine.

## 2017-08-02 NOTE — Telephone Encounter (Signed)
Reached out to patient to inform her that all information needed had been faxed over to [facility] for her to receive her CPAP machine. Patient understands she will be contacted by [facility] to set up her cpap. She understands to call if [facility] does not contact her with new setup in a timely manner. She understands she will be called once confirmation has been received from [facility] that she has received her new machine to schedule 10 week follow up appointment.  [facility] notified of new cpap order in epic Please add to [name] She was grateful for the call and thanked me

## 2017-08-05 ENCOUNTER — Ambulatory Visit (HOSPITAL_COMMUNITY): Payer: Medicare Other | Attending: Internal Medicine

## 2017-08-05 ENCOUNTER — Other Ambulatory Visit: Payer: Self-pay

## 2017-08-05 DIAGNOSIS — R809 Proteinuria, unspecified: Secondary | ICD-10-CM | POA: Diagnosis not present

## 2017-08-05 DIAGNOSIS — I420 Dilated cardiomyopathy: Secondary | ICD-10-CM | POA: Diagnosis not present

## 2017-08-05 DIAGNOSIS — Z940 Kidney transplant status: Secondary | ICD-10-CM | POA: Diagnosis not present

## 2017-08-05 DIAGNOSIS — M109 Gout, unspecified: Secondary | ICD-10-CM | POA: Diagnosis not present

## 2017-08-05 DIAGNOSIS — R0602 Shortness of breath: Secondary | ICD-10-CM

## 2017-08-05 DIAGNOSIS — E785 Hyperlipidemia, unspecified: Secondary | ICD-10-CM | POA: Diagnosis not present

## 2017-08-05 DIAGNOSIS — I480 Paroxysmal atrial fibrillation: Secondary | ICD-10-CM | POA: Diagnosis not present

## 2017-08-05 DIAGNOSIS — I619 Nontraumatic intracerebral hemorrhage, unspecified: Secondary | ICD-10-CM | POA: Diagnosis not present

## 2017-08-05 DIAGNOSIS — I770 Arteriovenous fistula, acquired: Secondary | ICD-10-CM | POA: Diagnosis not present

## 2017-08-05 DIAGNOSIS — I129 Hypertensive chronic kidney disease with stage 1 through stage 4 chronic kidney disease, or unspecified chronic kidney disease: Secondary | ICD-10-CM | POA: Diagnosis not present

## 2017-08-05 DIAGNOSIS — D899 Disorder involving the immune mechanism, unspecified: Secondary | ICD-10-CM | POA: Diagnosis not present

## 2017-08-05 DIAGNOSIS — N182 Chronic kidney disease, stage 2 (mild): Secondary | ICD-10-CM | POA: Diagnosis not present

## 2017-08-05 DIAGNOSIS — Z79899 Other long term (current) drug therapy: Secondary | ICD-10-CM | POA: Diagnosis not present

## 2017-08-05 DIAGNOSIS — N2581 Secondary hyperparathyroidism of renal origin: Secondary | ICD-10-CM | POA: Diagnosis not present

## 2017-08-06 ENCOUNTER — Telehealth: Payer: Self-pay | Admitting: Cardiology

## 2017-08-06 ENCOUNTER — Telehealth: Payer: Self-pay

## 2017-08-06 DIAGNOSIS — R0902 Hypoxemia: Secondary | ICD-10-CM | POA: Diagnosis not present

## 2017-08-06 DIAGNOSIS — J449 Chronic obstructive pulmonary disease, unspecified: Secondary | ICD-10-CM | POA: Diagnosis not present

## 2017-08-06 NOTE — Telephone Encounter (Signed)
Please stop her Cardizem given her severe LV dysfunction.  Please increase Carvedilol to 25mg  BID.  Please call and let nephrologist know that her LVF is severely reduced and we are stopping her Cardizem and increasing her Carvedilol for rate control.

## 2017-08-06 NOTE — Telephone Encounter (Signed)
Patient calling , returning call for echo results.

## 2017-08-06 NOTE — Telephone Encounter (Signed)
Returned call to patient. See telephone note from 08/06/17.

## 2017-08-06 NOTE — Telephone Encounter (Signed)
Called patient to review echo results. Patient stated that she has followed up with Dr. Joan Mayans. He is referring her to afib clinic in [place] to discuss options since she is not a candidate for long term anticoagulation. She states Dr. Joan Mayans stated he would fax records to the office.   Patient also stated she had labs yesterday at Dr. Jimmy Footman, nephrologist at [facility]. He states the cardizem 300 mg is too high of a dose for her kidneys. He recommends reducing it to 240 mg. I called office to request labs. Informed patient I would forward to Dr. Radford Pax for further recommendation. She verbalized understanding and thanked me for the call.

## 2017-08-07 ENCOUNTER — Telehealth (HOSPITAL_COMMUNITY): Payer: Self-pay | Admitting: *Deleted

## 2017-08-07 MED ORDER — CARVEDILOL 25 MG PO TABS: 25.0000 mg | ORAL_TABLET | Freq: Two times a day (BID) | ORAL | 3 refills | Status: DC

## 2017-08-07 NOTE — Telephone Encounter (Signed)
Pt called in to check status of appointment with Dr. Joan Mayans -- previous phone note was incorrect - pt has NOT seen Dr. Joan Mayans since being seen in the afib clinic on 07/31/17. At the time of visit with Roderic Palau NP in afib clinic she consulted with Dr. Rayann Heman who recommended pt follow up with Dr. Joan Mayans since pt was previously established with his practice with cardiac ablation in 2017 and her transplant care is there. Pt was instructed to call Dr. Joan Mayans office which she has not done. Pt was given number again to Dr. Tamela Gammon office to contact for appointment for consultation of recurrent afib in the setting of no anticoagulation from previous bleed. Pt is instructed to inform office if she should need any records faxed to Dr. Tamela Gammon office. Pt verbalized understanding.

## 2017-08-07 NOTE — Telephone Encounter (Signed)
Informed patient of Dr. Theodosia Blender recommendation to STOP cardizem due to severely reduced LVF and INCREASE carvedilol to 25 mg two times a day. Left message with Dr. Deterding's assistant to call back in regards to echo results and medication changes. Patient in agreement with plan, verbalized understanding and thankful for call.

## 2017-08-07 NOTE — Telephone Encounter (Signed)
Received call from Dr. Deterding's assistant. Informed her of echo results, STOP cardizem and INCREASE carvedilol to 25 mg BID. She stated she will forward information to MD.

## 2017-08-08 ENCOUNTER — Telehealth: Payer: Self-pay | Admitting: Family Medicine

## 2017-08-08 ENCOUNTER — Telehealth: Payer: Self-pay | Admitting: Cardiology

## 2017-08-08 MED ORDER — PROMETHAZINE-DM 6.25-15 MG/5ML PO SYRP: 5.0000 mL | ORAL_SOLUTION | Freq: Four times a day (QID) | ORAL | 0 refills | Status: DC | PRN

## 2017-08-08 MED ORDER — AZITHROMYCIN 500 MG PO TABS: 500.0000 mg | ORAL_TABLET | Freq: Every day | ORAL | 0 refills | Status: AC

## 2017-08-08 NOTE — Telephone Encounter (Signed)
It is fine for her to take  Let get cxr  z pak as directed #1

## 2017-08-08 NOTE — Telephone Encounter (Signed)
Patient states she has has productive mucous wihich is cream in color. Wants to know if the Phenergan DM will bother her Kidneys.

## 2017-08-08 NOTE — Telephone Encounter (Signed)
Called Deana at [facility]. Left message to call back

## 2017-08-08 NOTE — Telephone Encounter (Signed)
Please find out if he would be ok with Lopressor instead of Carvedilol

## 2017-08-08 NOTE — Telephone Encounter (Signed)
Deana at [facility] is calling to speak with you about one of the medication changes that Dr. Radford Pax did on Mrs. Linton Rump . Dr. Jimmy Footman has some questions

## 2017-08-08 NOTE — Telephone Encounter (Signed)
Pt is calling and Promethazine-dm is not covered under her insurance and would like something else similar called into her [facility].

## 2017-08-08 NOTE — Telephone Encounter (Signed)
Copied from [place]. Topic: General - Other >> Aug 08, 2017  9:18 AM Cecelia Byars, NT wrote: Reason for CRM: Patient called and is still not feeling better as far the cough and would like a script for cough medicine please advise  [phone]

## 2017-08-08 NOTE — Telephone Encounter (Signed)
Phenergan dm 1 tsp qid prn  8 oz We should get cxr  Also -- she is coughing up anything?  May need abx

## 2017-08-08 NOTE — Telephone Encounter (Signed)
Deana from [facility] stated per Dr. Jimmy Footman increasing carvedilol will alter how patient metabolizes her prograft medication (Envaersus). Informed her I would forward to Dr. Radford Pax for further recommendations. She verbalized understanding and thanked me for the call.

## 2017-08-08 NOTE — Telephone Encounter (Signed)
Tessalon perles 100 mg tid prn #30

## 2017-08-09 ENCOUNTER — Encounter: Payer: Self-pay | Admitting: Family Medicine

## 2017-08-09 ENCOUNTER — Ambulatory Visit (HOSPITAL_BASED_OUTPATIENT_CLINIC_OR_DEPARTMENT_OTHER)
Admission: RE | Admit: 2017-08-09 | Discharge: 2017-08-09 | Disposition: A | Discharge status: Home / Self Care | Payer: Medicare Other | Source: Ambulatory Visit | Attending: Family Medicine | Admitting: Family Medicine

## 2017-08-09 ENCOUNTER — Telehealth: Payer: Self-pay | Admitting: Cardiology

## 2017-08-09 ENCOUNTER — Other Ambulatory Visit: Payer: Self-pay

## 2017-08-09 DIAGNOSIS — E119 Type 2 diabetes mellitus without complications: Secondary | ICD-10-CM | POA: Diagnosis not present

## 2017-08-09 DIAGNOSIS — Z7952 Long term (current) use of systemic steroids: Secondary | ICD-10-CM | POA: Diagnosis not present

## 2017-08-09 DIAGNOSIS — Z5181 Encounter for therapeutic drug level monitoring: Secondary | ICD-10-CM | POA: Diagnosis not present

## 2017-08-09 DIAGNOSIS — Z4822 Encounter for aftercare following kidney transplant: Secondary | ICD-10-CM | POA: Diagnosis not present

## 2017-08-09 DIAGNOSIS — I10 Essential (primary) hypertension: Secondary | ICD-10-CM | POA: Diagnosis not present

## 2017-08-09 DIAGNOSIS — Z940 Kidney transplant status: Secondary | ICD-10-CM | POA: Diagnosis not present

## 2017-08-09 DIAGNOSIS — I4891 Unspecified atrial fibrillation: Secondary | ICD-10-CM | POA: Diagnosis not present

## 2017-08-09 DIAGNOSIS — I509 Heart failure, unspecified: Secondary | ICD-10-CM | POA: Insufficient documentation

## 2017-08-09 DIAGNOSIS — Z79899 Other long term (current) drug therapy: Secondary | ICD-10-CM | POA: Diagnosis not present

## 2017-08-09 DIAGNOSIS — Z792 Long term (current) use of antibiotics: Secondary | ICD-10-CM | POA: Diagnosis not present

## 2017-08-09 DIAGNOSIS — Z86718 Personal history of other venous thrombosis and embolism: Secondary | ICD-10-CM | POA: Diagnosis not present

## 2017-08-09 DIAGNOSIS — D8989 Other specified disorders involving the immune mechanism, not elsewhere classified: Secondary | ICD-10-CM | POA: Diagnosis not present

## 2017-08-09 DIAGNOSIS — J45901 Unspecified asthma with (acute) exacerbation: Secondary | ICD-10-CM

## 2017-08-09 DIAGNOSIS — E785 Hyperlipidemia, unspecified: Secondary | ICD-10-CM | POA: Diagnosis not present

## 2017-08-09 DIAGNOSIS — R05 Cough: Secondary | ICD-10-CM | POA: Diagnosis not present

## 2017-08-09 IMAGING — DX DG CHEST 2V
2 series · 2 of 2 positions shown · non-contrast
Comparison: [DATE].

CLINICAL DATA: Cough and congestion.

EXAM:
CHEST  2 VIEW

[chest pa]
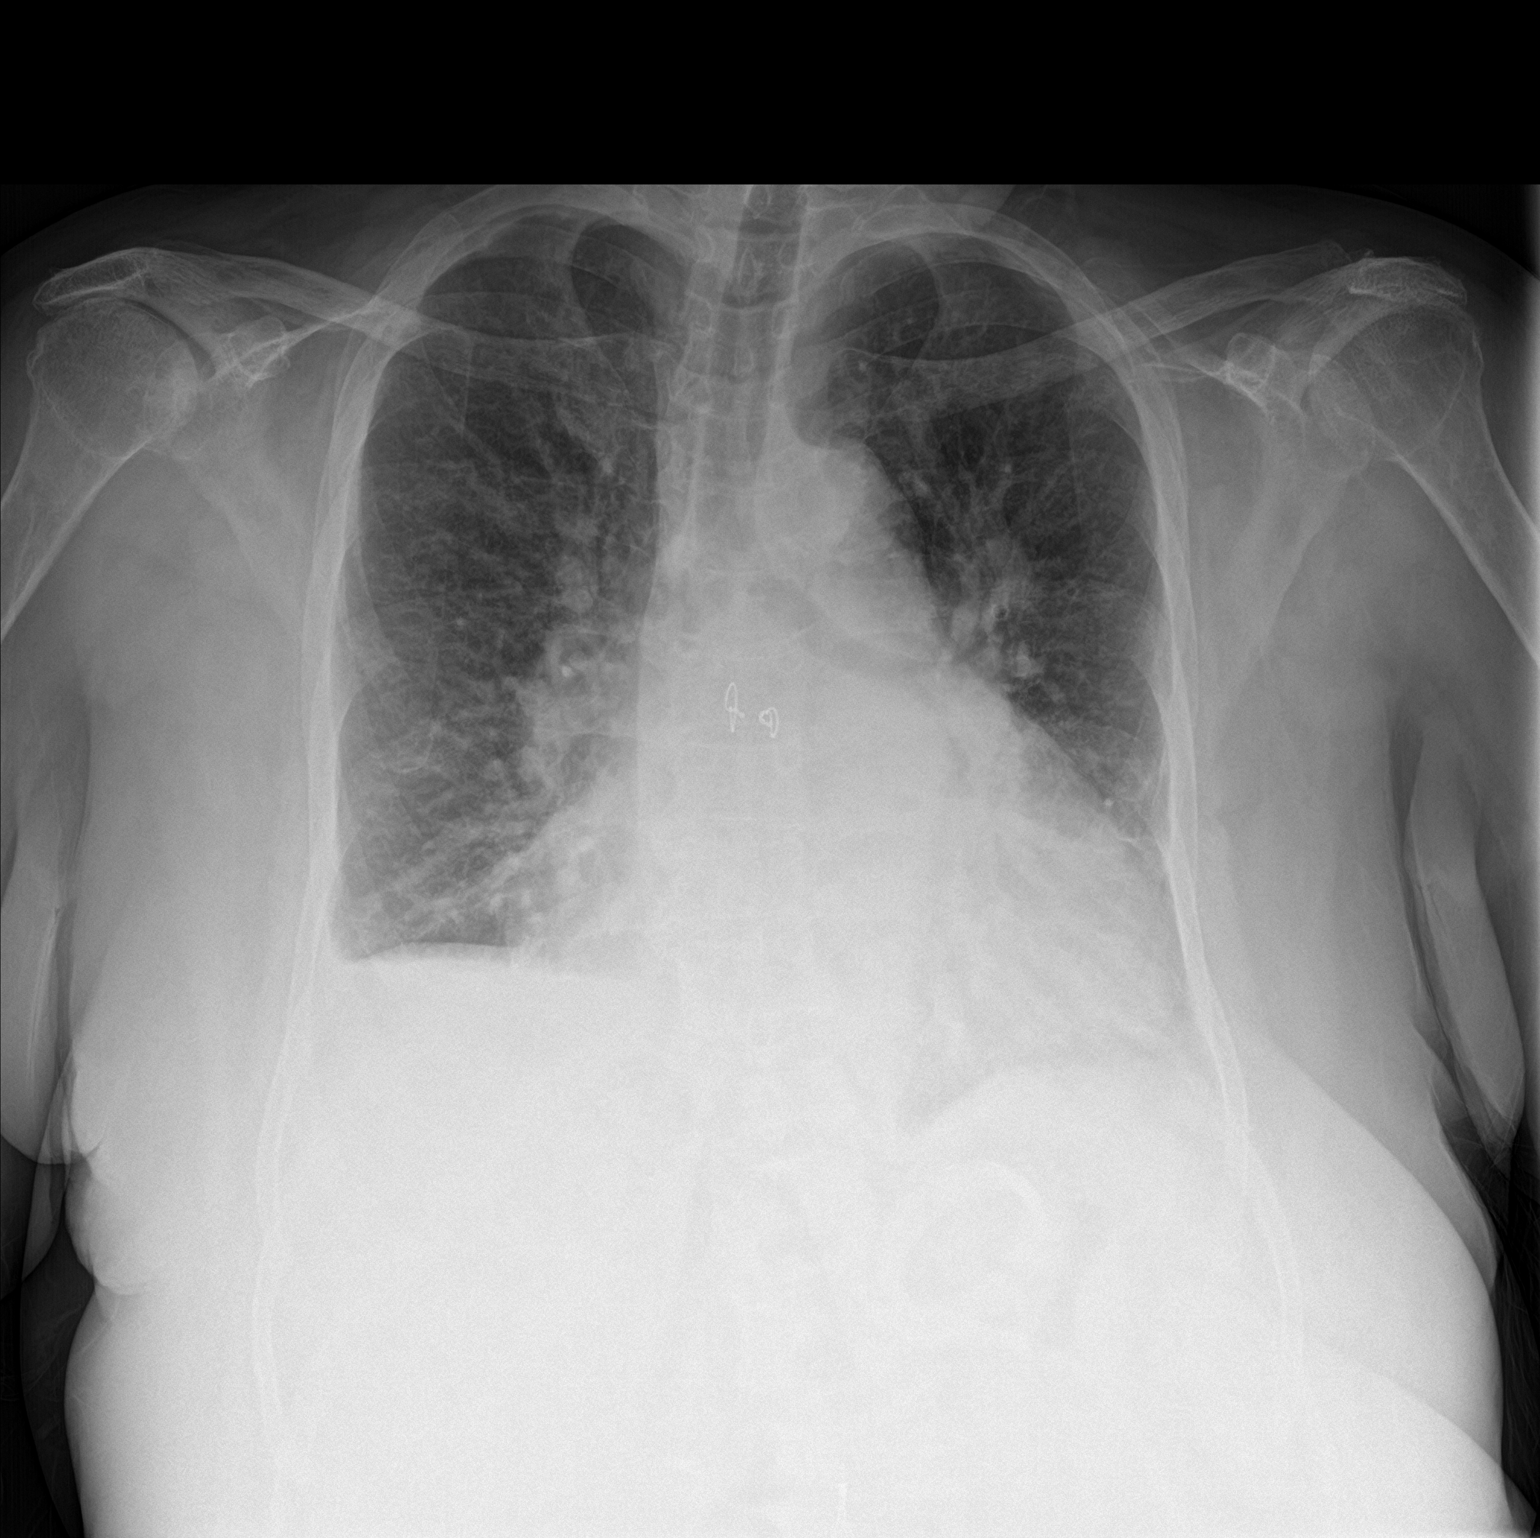

[chest lat]
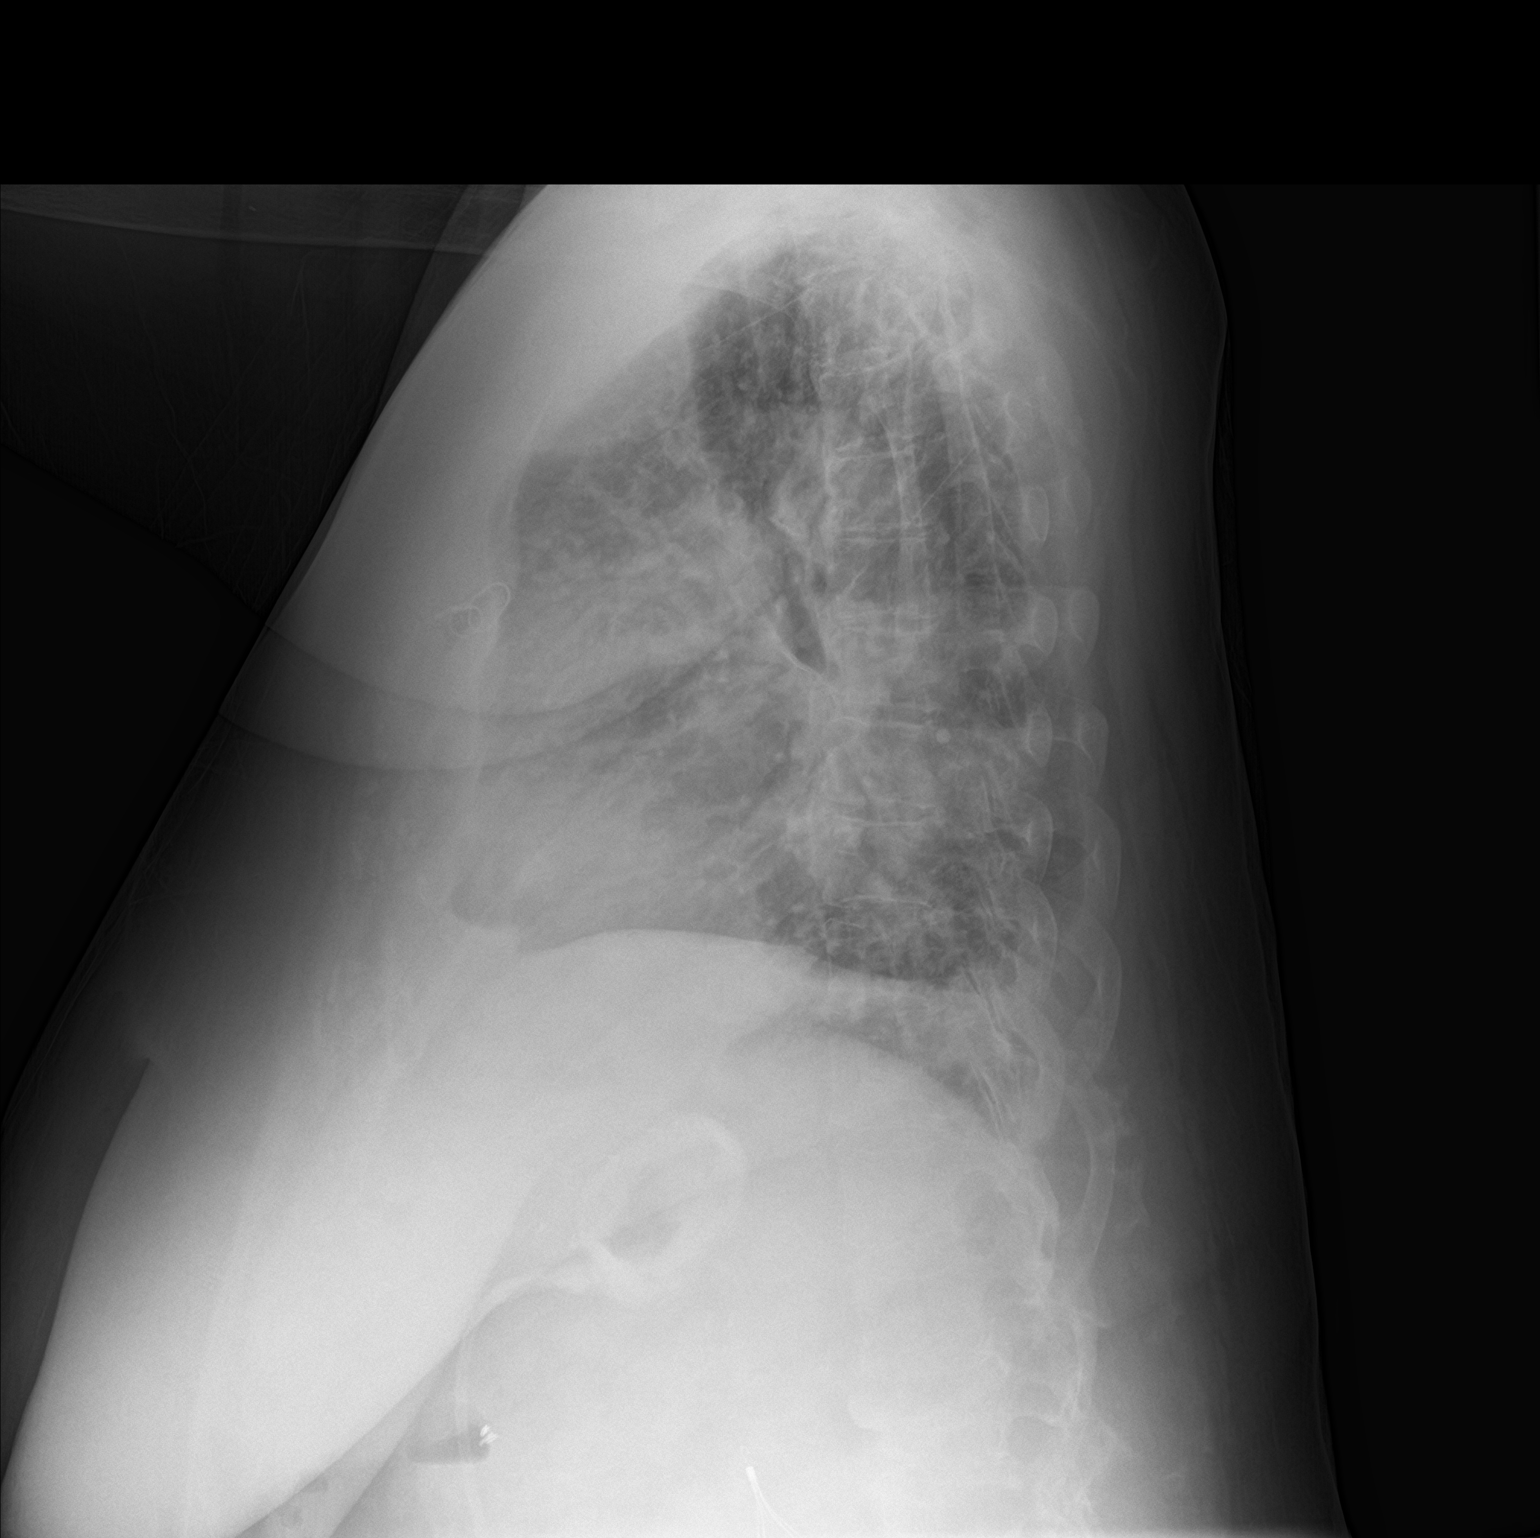

[2 of 2 positions shown; findings below may reference images not displayed]

FINDINGS: Surgical wiring noted over the anterior chest. Cardiomegaly with
pulmonary venous congestion bilateral interstitial prominence with
small right pleural effusion. Findings consistent CHF. Pneumonitis
cannot be excluded. No pneumothorax. Right apical pleural thickening
consistent with scarring. Lap band noted over the left upper
quadrant. Degenerative changes both shoulders and thoracic spine.
IMPRESSION: Congestive heart failure with pulmonary interstitial edema and small
right pleural effusion.

## 2017-08-09 MED ORDER — BENZONATATE 100 MG PO CAPS: 100.0000 mg | ORAL_CAPSULE | Freq: Three times a day (TID) | ORAL | 0 refills | Status: DC

## 2017-08-09 NOTE — Telephone Encounter (Signed)
Called and left a message with Deana at [facility] to follow up on switching patient from Carvedilol to lopressor.

## 2017-08-09 NOTE — Telephone Encounter (Signed)
Dr. Etter Sjogren spoke with patient about results.

## 2017-08-09 NOTE — Telephone Encounter (Signed)
Returned call to [employer]. There was no answer. Left message for her to call back.

## 2017-08-09 NOTE — Telephone Encounter (Signed)
New Message    Tilda Burrow is calling from [facility] in reference to patients medication. Please call to discuss.

## 2017-08-09 NOTE — Telephone Encounter (Signed)
Rx sent to pharmacy.  Patient is here in radiology will call later to let her know this was sent in.

## 2017-08-09 NOTE — Addendum Note (Signed)
Addended by: Kem Boroughs D on: 08/09/2017 11:40 AM   Modules accepted: Orders

## 2017-08-12 ENCOUNTER — Telehealth: Payer: Self-pay

## 2017-08-12 ENCOUNTER — Telehealth: Payer: Self-pay | Admitting: *Deleted

## 2017-08-12 DIAGNOSIS — Z8679 Personal history of other diseases of the circulatory system: Secondary | ICD-10-CM | POA: Diagnosis not present

## 2017-08-12 DIAGNOSIS — I5031 Acute diastolic (congestive) heart failure: Secondary | ICD-10-CM

## 2017-08-12 DIAGNOSIS — I4819 Other persistent atrial fibrillation: Secondary | ICD-10-CM | POA: Insufficient documentation

## 2017-08-12 DIAGNOSIS — Z9889 Other specified postprocedural states: Secondary | ICD-10-CM | POA: Diagnosis not present

## 2017-08-12 DIAGNOSIS — Z7901 Long term (current) use of anticoagulants: Secondary | ICD-10-CM | POA: Diagnosis not present

## 2017-08-12 DIAGNOSIS — Z95828 Presence of other vascular implants and grafts: Secondary | ICD-10-CM | POA: Diagnosis not present

## 2017-08-12 DIAGNOSIS — I5030 Unspecified diastolic (congestive) heart failure: Secondary | ICD-10-CM | POA: Diagnosis not present

## 2017-08-12 DIAGNOSIS — Z940 Kidney transplant status: Secondary | ICD-10-CM | POA: Diagnosis not present

## 2017-08-12 DIAGNOSIS — Z7982 Long term (current) use of aspirin: Secondary | ICD-10-CM | POA: Diagnosis not present

## 2017-08-12 DIAGNOSIS — E8770 Fluid overload, unspecified: Secondary | ICD-10-CM | POA: Diagnosis not present

## 2017-08-12 DIAGNOSIS — I4892 Unspecified atrial flutter: Secondary | ICD-10-CM | POA: Diagnosis not present

## 2017-08-12 DIAGNOSIS — G4733 Obstructive sleep apnea (adult) (pediatric): Secondary | ICD-10-CM | POA: Diagnosis not present

## 2017-08-12 DIAGNOSIS — I481 Persistent atrial fibrillation: Secondary | ICD-10-CM | POA: Diagnosis not present

## 2017-08-12 DIAGNOSIS — Z86718 Personal history of other venous thrombosis and embolism: Secondary | ICD-10-CM | POA: Diagnosis not present

## 2017-08-12 HISTORY — DX: Acute diastolic (congestive) heart failure: I50.31

## 2017-08-12 HISTORY — DX: Other persistent atrial fibrillation: I48.19

## 2017-08-12 NOTE — Telephone Encounter (Signed)
-----   Message from Sueanne Margarita, MD sent at 08/10/2017  3:00 PM EST ----- Please refer [name] to AHF clinic ASAP  Traci ----- Message ----- From: [name], DO Sent: 08/09/2017   1:14 PM To: Sueanne Margarita, MD  I've seen the pt for what we thought was bronchitis--- we got a cxr yesterday which shows CHF-----  We are in the process of calling to pt to see how she is feeling and if she is worse I will send her to the ER but she was not very symptomatic when we saw her.  I was hoping we could get her an appointment to see you and if there is anything else you would like me to tell her to do or inc her meds  I will try to call your office as well but we have had a hard time getting through the last few weeks so I wanted to send a staff message as well.  Thanks    [employer]

## 2017-08-12 NOTE — Telephone Encounter (Signed)
Left message for [name] to call back regarding medication change

## 2017-08-12 NOTE — Telephone Encounter (Signed)
-----   Message from [name], [place] sent at 08/09/2017  2:07 PM EST ----- Spoke to Dr Alena Bills needs to take the 40  Mg lasix tid -- for 3 days  Add potassium 20 meq daily for those 3 days  No salt Limit fluids to 1 1/2 liters a day  Pt has appointment with cardiology at [place] next week If symptoms worsen over weekend-- go to ER

## 2017-08-12 NOTE — Telephone Encounter (Signed)
Informed patient of Dr. Theodosia Blender recommendation to be seen in the HF clinic. Patient stated she has her appt at the afib clinic in [facility] today. I informed her that the schedulers will contact her to make an appt. Message sent to Kevan Rosebush, RN. Patient in agreement with plan and thanked me for the call.

## 2017-08-13 NOTE — Telephone Encounter (Signed)
Patient is going into the hospital tomorrow at [place].

## 2017-08-13 NOTE — Telephone Encounter (Signed)
Spoke with Deana at [facility]. She stated per Dr. Jimmy Footman lopressor will be okay. Will forward to Dr. Radford Pax for review.

## 2017-08-14 DIAGNOSIS — I491 Atrial premature depolarization: Secondary | ICD-10-CM | POA: Diagnosis not present

## 2017-08-14 DIAGNOSIS — I480 Paroxysmal atrial fibrillation: Secondary | ICD-10-CM | POA: Diagnosis not present

## 2017-08-14 DIAGNOSIS — J90 Pleural effusion, not elsewhere classified: Secondary | ICD-10-CM | POA: Diagnosis not present

## 2017-08-14 DIAGNOSIS — H9190 Unspecified hearing loss, unspecified ear: Secondary | ICD-10-CM | POA: Diagnosis present

## 2017-08-14 DIAGNOSIS — E1122 Type 2 diabetes mellitus with diabetic chronic kidney disease: Secondary | ICD-10-CM | POA: Diagnosis not present

## 2017-08-14 DIAGNOSIS — D899 Disorder involving the immune mechanism, unspecified: Secondary | ICD-10-CM | POA: Diagnosis not present

## 2017-08-14 DIAGNOSIS — F329 Major depressive disorder, single episode, unspecified: Secondary | ICD-10-CM | POA: Diagnosis present

## 2017-08-14 DIAGNOSIS — Z86711 Personal history of pulmonary embolism: Secondary | ICD-10-CM | POA: Diagnosis not present

## 2017-08-14 DIAGNOSIS — M109 Gout, unspecified: Secondary | ICD-10-CM | POA: Diagnosis present

## 2017-08-14 DIAGNOSIS — I447 Left bundle-branch block, unspecified: Secondary | ICD-10-CM | POA: Diagnosis not present

## 2017-08-14 DIAGNOSIS — E876 Hypokalemia: Secondary | ICD-10-CM | POA: Diagnosis not present

## 2017-08-14 DIAGNOSIS — G4733 Obstructive sleep apnea (adult) (pediatric): Secondary | ICD-10-CM | POA: Diagnosis not present

## 2017-08-14 DIAGNOSIS — R32 Unspecified urinary incontinence: Secondary | ICD-10-CM | POA: Diagnosis not present

## 2017-08-14 DIAGNOSIS — Z9889 Other specified postprocedural states: Secondary | ICD-10-CM | POA: Diagnosis not present

## 2017-08-14 DIAGNOSIS — I5021 Acute systolic (congestive) heart failure: Secondary | ICD-10-CM | POA: Diagnosis not present

## 2017-08-14 DIAGNOSIS — I5030 Unspecified diastolic (congestive) heart failure: Secondary | ICD-10-CM | POA: Diagnosis not present

## 2017-08-14 DIAGNOSIS — J45909 Unspecified asthma, uncomplicated: Secondary | ICD-10-CM | POA: Diagnosis not present

## 2017-08-14 DIAGNOSIS — Z86718 Personal history of other venous thrombosis and embolism: Secondary | ICD-10-CM | POA: Diagnosis not present

## 2017-08-14 DIAGNOSIS — Z7951 Long term (current) use of inhaled steroids: Secondary | ICD-10-CM | POA: Diagnosis not present

## 2017-08-14 DIAGNOSIS — Z79899 Other long term (current) drug therapy: Secondary | ICD-10-CM | POA: Diagnosis not present

## 2017-08-14 DIAGNOSIS — E785 Hyperlipidemia, unspecified: Secondary | ICD-10-CM | POA: Diagnosis not present

## 2017-08-14 DIAGNOSIS — I493 Ventricular premature depolarization: Secondary | ICD-10-CM | POA: Diagnosis not present

## 2017-08-14 DIAGNOSIS — Z96641 Presence of right artificial hip joint: Secondary | ICD-10-CM | POA: Diagnosis present

## 2017-08-14 DIAGNOSIS — Z7901 Long term (current) use of anticoagulants: Secondary | ICD-10-CM | POA: Diagnosis not present

## 2017-08-14 DIAGNOSIS — R05 Cough: Secondary | ICD-10-CM | POA: Diagnosis present

## 2017-08-14 DIAGNOSIS — Z7952 Long term (current) use of systemic steroids: Secondary | ICD-10-CM | POA: Diagnosis not present

## 2017-08-14 DIAGNOSIS — B348 Other viral infections of unspecified site: Secondary | ICD-10-CM | POA: Diagnosis present

## 2017-08-14 DIAGNOSIS — N186 End stage renal disease: Secondary | ICD-10-CM | POA: Diagnosis not present

## 2017-08-14 DIAGNOSIS — I132 Hypertensive heart and chronic kidney disease with heart failure and with stage 5 chronic kidney disease, or end stage renal disease: Secondary | ICD-10-CM | POA: Diagnosis not present

## 2017-08-14 DIAGNOSIS — E213 Hyperparathyroidism, unspecified: Secondary | ICD-10-CM | POA: Diagnosis not present

## 2017-08-14 DIAGNOSIS — B9729 Other coronavirus as the cause of diseases classified elsewhere: Secondary | ICD-10-CM | POA: Diagnosis not present

## 2017-08-14 DIAGNOSIS — Z992 Dependence on renal dialysis: Secondary | ICD-10-CM | POA: Diagnosis not present

## 2017-08-14 DIAGNOSIS — E119 Type 2 diabetes mellitus without complications: Secondary | ICD-10-CM | POA: Diagnosis not present

## 2017-08-14 DIAGNOSIS — B342 Coronavirus infection, unspecified: Secondary | ICD-10-CM | POA: Diagnosis not present

## 2017-08-14 DIAGNOSIS — E669 Obesity, unspecified: Secondary | ICD-10-CM | POA: Diagnosis present

## 2017-08-14 DIAGNOSIS — Z95828 Presence of other vascular implants and grafts: Secondary | ICD-10-CM | POA: Diagnosis not present

## 2017-08-14 DIAGNOSIS — I481 Persistent atrial fibrillation: Secondary | ICD-10-CM | POA: Diagnosis not present

## 2017-08-14 DIAGNOSIS — I4892 Unspecified atrial flutter: Secondary | ICD-10-CM | POA: Diagnosis not present

## 2017-08-14 DIAGNOSIS — I4891 Unspecified atrial fibrillation: Secondary | ICD-10-CM | POA: Diagnosis not present

## 2017-08-14 DIAGNOSIS — Z8673 Personal history of transient ischemic attack (TIA), and cerebral infarction without residual deficits: Secondary | ICD-10-CM | POA: Diagnosis not present

## 2017-08-14 DIAGNOSIS — I5023 Acute on chronic systolic (congestive) heart failure: Secondary | ICD-10-CM | POA: Diagnosis present

## 2017-08-14 DIAGNOSIS — Z940 Kidney transplant status: Secondary | ICD-10-CM | POA: Diagnosis not present

## 2017-08-14 DIAGNOSIS — Z7982 Long term (current) use of aspirin: Secondary | ICD-10-CM | POA: Diagnosis not present

## 2017-08-14 DIAGNOSIS — Z888 Allergy status to other drugs, medicaments and biological substances status: Secondary | ICD-10-CM | POA: Diagnosis not present

## 2017-08-14 DIAGNOSIS — I110 Hypertensive heart disease with heart failure: Secondary | ICD-10-CM | POA: Diagnosis not present

## 2017-08-14 DIAGNOSIS — I5020 Unspecified systolic (congestive) heart failure: Secondary | ICD-10-CM | POA: Diagnosis not present

## 2017-08-15 ENCOUNTER — Telehealth: Payer: Self-pay

## 2017-08-15 NOTE — Telephone Encounter (Signed)
Spoke with Gerald Stabs, scheduler at Dr. Tamela Gammon office. Informed him that per Dr.Turner, she would like for patient to be followed at the CHF clinic at [facility]. He stated he would pass the message to Helene Kelp, RN with Dr. Joan Mayans and she would give me a call back.

## 2017-08-15 NOTE — Telephone Encounter (Signed)
Returned call to [place], Therapist, sports. She states that patient was seen by Dr. Joan Mayans on 08/12/17 for a planned admission on 08/14/17. Patient is currently admitted, she is getting sotalol and scheduled for a cardioversion on Monday. Faxed echo to [place], [place] at [phone].

## 2017-08-15 NOTE — Telephone Encounter (Signed)
Follow up    Helene Kelp from Dr. Tamela Gammon office is returning call. Please call

## 2017-08-15 NOTE — Telephone Encounter (Signed)
-----   Message from Sueanne Margarita, MD sent at 08/13/2017  5:28 PM EST ----- Regarding: RE: follow up appt Please call [facility] Dr. Tamela Gammon office and let them know I would like her to be followed in CHF clinic at [facility] since her Tx MD is there to for her kidneys  Traci ----- Message ----- From: Teressa Senter, RN Sent: 08/13/2017   5:17 PM To: Sueanne Margarita, MD Subject: FW: follow up appt                               ----- Message ----- From: Scarlette Calico, RN Sent: 08/13/2017   3:40 PM To: Teressa Senter, RN Subject: RE: follow up appt                             We spoke w/this pt, she was a little upset and didn't know why she was being referred her, she said she was being admitted at [facility] and she would call Dr Theodosia Blender office, she would not make an appt w/us thanks  ----- Message ----- From: Teressa Senter, RN Sent: 08/13/2017   2:06 PM To: Scarlette Calico, RN Subject: RE: follow up appt                             Her primary MD Dr. Carollee Herter sent a message to Dr. Radford Pax regarding a really bad cough. Dr. Radford Pax adjusted some meds at her last visit due to decreased EF at 25-30 %  ----- Message ----- From: Scarlette Calico, RN Sent: 08/13/2017   1:59 PM To: Teressa Senter, RN Subject: RE: follow up appt                             Do you know why  She needs to be seen asap, I see your phone not about chest xray showing chf but no symptoms, I don't really have any appt this week so trying to prioritize pt's ----- Message ----- From: Teressa Senter, RN Sent: 08/13/2017   9:14 AM To: Scarlette Calico, RN Subject: follow up appt                                 Following up on an appt for patient. Dr. Radford Pax would like for her to been seen ASAP.   Thanks  [employer]

## 2017-08-20 MED ORDER — ASPIRIN EC 81 MG PO TBEC
81.00 | DELAYED_RELEASE_TABLET | ORAL | Status: DC
Start: 2017-08-21 — End: 2017-08-20

## 2017-08-20 MED ORDER — MAGNESIUM OXIDE 400 MG PO TABS
400.00 | ORAL_TABLET | ORAL | Status: DC
Start: 2017-08-20 — End: 2017-08-20

## 2017-08-20 MED ORDER — GUAIFENESIN ER 600 MG PO TB12
600.00 | ORAL_TABLET | ORAL | Status: DC
Start: 2017-08-20 — End: 2017-08-20

## 2017-08-20 MED ORDER — ALUMINUM-MAGNESIUM-SIMETHICONE 200-200-20 MG/5ML PO SUSP
30.00 | ORAL | Status: DC
Start: ? — End: 2017-08-20

## 2017-08-20 MED ORDER — ACETAMINOPHEN 325 MG PO TABS
650.00 | ORAL_TABLET | ORAL | Status: DC
Start: ? — End: 2017-08-20

## 2017-08-20 MED ORDER — MYCOPHENOLATE SODIUM 180 MG PO TBEC
180.00 | DELAYED_RELEASE_TABLET | ORAL | Status: DC
Start: 2017-08-20 — End: 2017-08-20

## 2017-08-20 MED ORDER — FLUTICASONE PROPIONATE HFA 220 MCG/ACT IN AERO
2.00 | INHALATION_SPRAY | RESPIRATORY_TRACT | Status: DC
Start: 2017-08-20 — End: 2017-08-20

## 2017-08-20 MED ORDER — MAGNESIUM HYDROXIDE 400 MG/5ML PO SUSP
30.00 | ORAL | Status: DC
Start: ? — End: 2017-08-20

## 2017-08-20 MED ORDER — SERTRALINE HCL 50 MG PO TABS
50.00 | ORAL_TABLET | ORAL | Status: DC
Start: 2017-08-21 — End: 2017-08-20

## 2017-08-20 MED ORDER — GENERIC EXTERNAL MEDICATION
2.00 | Status: DC
Start: ? — End: 2017-08-20

## 2017-08-20 MED ORDER — CARVEDILOL 12.5 MG PO TABS
25.00 | ORAL_TABLET | ORAL | Status: DC
Start: 2017-08-20 — End: 2017-08-20

## 2017-08-20 MED ORDER — DOCUSATE SODIUM 100 MG PO CAPS
100.00 | ORAL_CAPSULE | ORAL | Status: DC
Start: ? — End: 2017-08-20

## 2017-08-20 MED ORDER — APIXABAN 5 MG PO TABS
5.00 | ORAL_TABLET | ORAL | Status: DC
Start: 2017-08-20 — End: 2017-08-20

## 2017-08-20 MED ORDER — TACROLIMUS 1 MG PO TB24
1.00 | ORAL_TABLET | ORAL | Status: DC
Start: 2017-08-21 — End: 2017-08-20

## 2017-08-20 MED ORDER — BENZONATATE 100 MG PO CAPS
100.00 | ORAL_CAPSULE | ORAL | Status: DC
Start: 2017-08-20 — End: 2017-08-20

## 2017-08-20 MED ORDER — FUROSEMIDE 40 MG PO TABS
40.00 | ORAL_TABLET | ORAL | Status: DC
Start: 2017-08-20 — End: 2017-08-20

## 2017-08-20 MED ORDER — PREDNISONE 5 MG PO TABS
5.00 | ORAL_TABLET | ORAL | Status: DC
Start: 2017-08-21 — End: 2017-08-20

## 2017-08-20 MED ORDER — SOTALOL HCL 80 MG PO TABS
80.00 | ORAL_TABLET | ORAL | Status: DC
Start: 2017-08-20 — End: 2017-08-20

## 2017-08-20 MED ORDER — IPRATROPIUM-ALBUTEROL 0.5-2.5 (3) MG/3ML IN SOLN
3.00 | RESPIRATORY_TRACT | Status: DC
Start: ? — End: 2017-08-20

## 2017-08-21 ENCOUNTER — Other Ambulatory Visit: Payer: Self-pay | Admitting: Family Medicine

## 2017-08-21 ENCOUNTER — Telehealth: Payer: Self-pay | Admitting: Family Medicine

## 2017-08-21 DIAGNOSIS — I480 Paroxysmal atrial fibrillation: Secondary | ICD-10-CM

## 2017-08-21 NOTE — Telephone Encounter (Signed)
She was just in hosp at [facility].  I'm surprised they did not order it.  Referral placed

## 2017-08-21 NOTE — Telephone Encounter (Signed)
Copied from [place] [phone]. Topic: Referral - Request >> Aug 21, 2017 10:19 AM Corie Chiquito, NT wrote: Reason for CRM: Patient husband calling because he would like to know if Dr.Lowne could give them a referral to have OT&PT come out to there home to help with his wife. If someone could give them a call back about this at [phone]

## 2017-08-26 DIAGNOSIS — D649 Anemia, unspecified: Secondary | ICD-10-CM | POA: Diagnosis not present

## 2017-08-26 DIAGNOSIS — I739 Peripheral vascular disease, unspecified: Secondary | ICD-10-CM | POA: Diagnosis not present

## 2017-08-26 DIAGNOSIS — J45909 Unspecified asthma, uncomplicated: Secondary | ICD-10-CM | POA: Diagnosis not present

## 2017-08-26 DIAGNOSIS — I420 Dilated cardiomyopathy: Secondary | ICD-10-CM | POA: Diagnosis not present

## 2017-08-26 DIAGNOSIS — M160 Bilateral primary osteoarthritis of hip: Secondary | ICD-10-CM | POA: Diagnosis not present

## 2017-08-26 DIAGNOSIS — I10 Essential (primary) hypertension: Secondary | ICD-10-CM | POA: Diagnosis not present

## 2017-08-26 DIAGNOSIS — Z7982 Long term (current) use of aspirin: Secondary | ICD-10-CM | POA: Diagnosis not present

## 2017-08-26 DIAGNOSIS — G4733 Obstructive sleep apnea (adult) (pediatric): Secondary | ICD-10-CM | POA: Diagnosis not present

## 2017-08-26 DIAGNOSIS — Z940 Kidney transplant status: Secondary | ICD-10-CM | POA: Diagnosis not present

## 2017-08-26 DIAGNOSIS — F329 Major depressive disorder, single episode, unspecified: Secondary | ICD-10-CM | POA: Diagnosis not present

## 2017-08-26 DIAGNOSIS — I480 Paroxysmal atrial fibrillation: Secondary | ICD-10-CM | POA: Diagnosis not present

## 2017-08-28 ENCOUNTER — Telehealth: Payer: Self-pay | Admitting: *Deleted

## 2017-08-28 DIAGNOSIS — I420 Dilated cardiomyopathy: Secondary | ICD-10-CM | POA: Diagnosis not present

## 2017-08-28 DIAGNOSIS — M160 Bilateral primary osteoarthritis of hip: Secondary | ICD-10-CM | POA: Diagnosis not present

## 2017-08-28 DIAGNOSIS — J45909 Unspecified asthma, uncomplicated: Secondary | ICD-10-CM | POA: Diagnosis not present

## 2017-08-28 DIAGNOSIS — I739 Peripheral vascular disease, unspecified: Secondary | ICD-10-CM | POA: Diagnosis not present

## 2017-08-28 DIAGNOSIS — I480 Paroxysmal atrial fibrillation: Secondary | ICD-10-CM | POA: Diagnosis not present

## 2017-08-28 DIAGNOSIS — I10 Essential (primary) hypertension: Secondary | ICD-10-CM | POA: Diagnosis not present

## 2017-08-28 NOTE — Telephone Encounter (Signed)
Received Physician Orders [x2] from [facility]; forwarded to provider/SLS 02/13

## 2017-08-28 NOTE — Telephone Encounter (Signed)
Received Medical records from [facility]; forwarded to provider/SLS 02/13

## 2017-08-30 DIAGNOSIS — I10 Essential (primary) hypertension: Secondary | ICD-10-CM | POA: Diagnosis not present

## 2017-08-30 DIAGNOSIS — I420 Dilated cardiomyopathy: Secondary | ICD-10-CM | POA: Diagnosis not present

## 2017-08-30 DIAGNOSIS — I739 Peripheral vascular disease, unspecified: Secondary | ICD-10-CM | POA: Diagnosis not present

## 2017-08-30 DIAGNOSIS — I480 Paroxysmal atrial fibrillation: Secondary | ICD-10-CM | POA: Diagnosis not present

## 2017-08-30 DIAGNOSIS — J45909 Unspecified asthma, uncomplicated: Secondary | ICD-10-CM | POA: Diagnosis not present

## 2017-08-30 DIAGNOSIS — M160 Bilateral primary osteoarthritis of hip: Secondary | ICD-10-CM | POA: Diagnosis not present

## 2017-09-02 ENCOUNTER — Encounter: Payer: Self-pay | Admitting: Family Medicine

## 2017-09-02 ENCOUNTER — Ambulatory Visit (INDEPENDENT_AMBULATORY_CARE_PROVIDER_SITE_OTHER): Payer: Medicare Other | Admitting: Family Medicine

## 2017-09-02 VITALS — BP 116/88 | HR 108 | Temp 97.8°F | Resp 16 | Ht 65.0 in | Wt 231.6 lb

## 2017-09-02 DIAGNOSIS — Z940 Kidney transplant status: Secondary | ICD-10-CM

## 2017-09-02 DIAGNOSIS — B342 Coronavirus infection, unspecified: Secondary | ICD-10-CM | POA: Diagnosis not present

## 2017-09-02 DIAGNOSIS — I10 Essential (primary) hypertension: Secondary | ICD-10-CM | POA: Diagnosis not present

## 2017-09-02 DIAGNOSIS — M160 Bilateral primary osteoarthritis of hip: Secondary | ICD-10-CM | POA: Diagnosis not present

## 2017-09-02 DIAGNOSIS — I480 Paroxysmal atrial fibrillation: Secondary | ICD-10-CM | POA: Diagnosis not present

## 2017-09-02 DIAGNOSIS — I739 Peripheral vascular disease, unspecified: Secondary | ICD-10-CM | POA: Diagnosis not present

## 2017-09-02 DIAGNOSIS — J45909 Unspecified asthma, uncomplicated: Secondary | ICD-10-CM

## 2017-09-02 DIAGNOSIS — I420 Dilated cardiomyopathy: Secondary | ICD-10-CM | POA: Diagnosis not present

## 2017-09-02 MED ORDER — GUAIFENESIN ER 600 MG PO TB12: 600.0000 mg | ORAL_TABLET | Freq: Two times a day (BID) | ORAL | 2 refills | Status: DC

## 2017-09-02 NOTE — Assessment & Plan Note (Signed)
con't neb/ inh Refer to pulm

## 2017-09-02 NOTE — Patient Instructions (Signed)
Upper Respiratory Infection, Adult Most upper respiratory infections (URIs) are caused by a virus. A URI affects the nose, throat, and upper air passages. The most common type of URI is often called "the common cold." Follow these instructions at home:  Take medicines only as told by your doctor.  Gargle warm saltwater or take cough drops to comfort your throat as told by your doctor.  Use a warm mist humidifier or inhale steam from a shower to increase air moisture. This may make it easier to breathe.  Drink enough fluid to keep your pee (urine) clear or pale yellow.  Eat soups and other clear broths.  Have a healthy diet.  Rest as needed.  Go back to work when your fever is gone or your doctor says it is okay. ? You may need to stay home longer to avoid giving your URI to others. ? You can also wear a face mask and wash your hands often to prevent spread of the virus.  Use your inhaler more if you have asthma.  Do not use any tobacco products, including cigarettes, chewing tobacco, or electronic cigarettes. If you need help quitting, ask your doctor. Contact a doctor if:  You are getting worse, not better.  Your symptoms are not helped by medicine.  You have chills.  You are getting more short of breath.  You have brown or red mucus.  You have yellow or brown discharge from your nose.  You have pain in your face, especially when you bend forward.  You have a fever.  You have puffy (swollen) neck glands.  You have pain while swallowing.  You have white areas in the back of your throat. Get help right away if:  You have very bad or constant: ? Headache. ? Ear pain. ? Pain in your forehead, behind your eyes, and over your cheekbones (sinus pain). ? Chest pain.  You have long-lasting (chronic) lung disease and any of the following: ? Wheezing. ? Long-lasting cough. ? Coughing up blood. ? A change in your usual mucus.  You have a stiff neck.  You have  changes in your: ? Vision. ? Hearing. ? Thinking. ? Mood. This information is not intended to replace advice given to you by your health care provider. Make sure you discuss any questions you have with your health care provider. Document Released: 12/19/2007 Document Revised: 03/04/2016 Document Reviewed: 10/07/2013 Elsevier Interactive Patient Education  2018 Elsevier Inc.  

## 2017-09-02 NOTE — Progress Notes (Signed)
Patient ID: [name], female    DOB: Nov 06, 1945  Age: 72 y.o. MRN: [id number]    Subjective:  Subjective  HPI [name] presents for f/u hosp for coronavirus , a fib .  Pt was in [facility] in Jan --- records reviewed in care everywhere.    Review of Systems  Constitutional: Negative for chills and fever.  HENT: Positive for congestion. Negative for postnasal drip, rhinorrhea and sinus pressure.   Respiratory: Positive for cough, chest tightness, shortness of breath and wheezing.   Cardiovascular: Negative for chest pain, palpitations and leg swelling.  Allergic/Immunologic: Negative for environmental allergies.    History Past Medical History:  Diagnosis Date  . Anemia   . Arthritis    HIP  . Asthma    as a child  . Atrial flutter [facility])    s/p aflutter ablation at [facility]  . Blood transfusion    never had a reaction to blood transfusion  . Cardiomyopathy Mar 2013   Mild, EF 50-55% by Mar 2013 ECHO, diast dysfxn II  . Closed fracture of right distal radius 10/18/2016  . Constipation    takes Miralax daily  . Depression    takes Zoloft daily  . Distal radius fracture, right   . DVT of lower extremity, bilateral ([place]) 12/21/11   "they're there now; been there for 2 wks"  . Eczema   . ESRD (end stage renal disease) on dialysis [facility]) 09/2011   07-19-2015 had Kidney transplant at [facility]  . Fractures, stress    in both feet--6 OR 7 YRS AGO--RESOLVED  . Gout    doesn't require meds   . HCAP (healthcare-associated pneumonia)    09/10/11  . Hypercalcemia    09/10/11  . Hypertension    takes Diltiazem daily   . Morbid obesity ([place])   . Nonischemic dilated cardiomyopathy ([place])   . Oligouria   . PAF (paroxysmal atrial fibrillation) ([facility])     HX OF CEREBRAL BLEED WHILE ON COUMADIN-SO PT NOT ON ANY BLOOD THINNERS NOW  . Peripheral vascular disease ([place])   . Sleep apnea    uses CPAP nightly  . Stroke [facility]) 2009   denies residual, hemorrhagic now off coumadin  . 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 [facility]) 2009    She has a past surgical history that includes Cardiac valuve replacement (1960); I&D extremity (09/15/2011); AV fistula placement (09/28/2011); Cystoscopy; Colonoscopy; Irrigation and debridement abscess (12/21/11); Vena cava filter placement (11/2011); Total hip arthroplasty (02/2011); Femur fracture surgery (03/2011; 09/03/2011); Insertion of dialysis catheter; Cholecystectomy; Incision and drainage hip (12/21/2011); Total hip revision (03/25/2012); Kidney transplant (07/19/15); Joint replacement; Laparoscopic gastric banding; and Open reduction internal fixation (orif) distal radial fracture (Right, 10/18/2016).   Her family history includes Cancer in her father; Diabetes in her mother; Hodgkin's lymphoma (age of onset: 104) in her unknown relative; Hypertension in her mother.She reports that  has never smoked. she has never used smokeless tobacco. She reports that she does not drink alcohol or use drugs.  Current Outpatient Medications on File Prior to Visit  Medication Sig Dispense Refill  . albuterol (PROVENTIL HFA;VENTOLIN HFA) 108 (90 Base) MCG/ACT inhaler Inhale 2 puffs into the lungs every 6 (six) hours as needed for wheezing or shortness of breath. 18 g 5  . albuterol (PROVENTIL) (2.5 MG/3ML) 0.083% nebulizer solution Take 3 mLs (2.5 mg total) by nebulization every 6 (six) hours as needed for wheezing or shortness of breath. 75 mL 12  . aspirin EC 81 MG  tablet Take 81 mg by mouth.    . benzonatate (TESSALON) 100 MG capsule Take 1 capsule (100 mg total) by mouth 3 (three) times daily. 30 capsule 0  . carvedilol (COREG) 25 MG tablet Take 1 tablet (25 mg total) by mouth 2 (two) times daily. 180 tablet 3  . docusate sodium (COLACE) 100 MG capsule Take 100 mg by mouth daily as needed (For constipation.).     Marland Kitchen fluticasone (FLOVENT HFA) 220 MCG/ACT inhaler Inhale 2 puffs into the lungs 2 (two) times daily. 1 Inhaler 5  . furosemide (LASIX) 40 MG tablet Take 40 mg by mouth  daily. Take 2 tablets by mouth daily (Total 80 mg)    . HYDROcodone-acetaminophen (NORCO) 10-325 MG tablet Take 1-2 tablets by mouth every 6 (six) hours as needed. 50 tablet 0  . mycophenolate (MYFORTIC) 180 MG EC tablet Take 180 mg by mouth 2 (two) times daily.    . predniSONE (DELTASONE) 10 MG tablet TAKE 3 TABLETS PO QD FOR 3 DAYS THEN TAKE 2 TABLETS PO QD FOR 3 DAYS THEN TAKE 1 TABLET PO QD FOR 3 DAYS THEN TAKE 1/2 TAB PO QD FOR 3 DAYS 20 tablet 0  . predniSONE (DELTASONE) 5 MG tablet Take 5 mg by mouth daily with breakfast.    . promethazine-dextromethorphan (PROMETHAZINE-DM) 6.25-15 MG/5ML syrup Take 5 mLs by mouth 4 (four) times daily as needed for cough. 118 mL 0  . sertraline (ZOLOFT) 50 MG tablet Take 1 tablet (50 mg total) by mouth daily. 90 tablet 3  . Tacrolimus (ENVARSUS XR) 1 MG TB24 Take by mouth.     No current facility-administered medications on file prior to visit.      Objective:  Objective  Physical Exam  Constitutional: She is oriented to person, place, and time. She appears well-developed and well-nourished.  HENT:  Right Ear: External ear normal.  Left Ear: External ear normal.  + PND + errythema  Eyes: Conjunctivae are normal. Right eye exhibits no discharge. Left eye exhibits no discharge.  Cardiovascular: Normal rate, regular rhythm and normal heart sounds.  No murmur heard. Pulmonary/Chest: Effort normal. No respiratory distress. She has no wheezes. She has no rales. She exhibits no tenderness.  No wheezing or rhonci/rales heard   Musculoskeletal: She exhibits no edema.  Lymphadenopathy:    She has no cervical adenopathy.  Neurological: She is alert and oriented to person, place, and time.  Nursing note and vitals reviewed.  BP 116/88 (BP Location: Right Arm, Cuff Size: Large)   Pulse (!) 108   Temp 97.8 F (36.6 C) (Oral)   Resp 16   Ht 5\' 5"  (1.651 m)   Wt 231 lb 9.6 oz (105.1 kg)   SpO2 99%   BMI 38.54 kg/m  Wt Readings from Last 3 Encounters:   09/02/17 231 lb 9.6 oz (105.1 kg)  07/31/17 246 lb 3.2 oz (111.7 kg)  07/26/17 249 lb (112.9 kg)     Lab Results  Component Value Date   WBC 8.1 03/31/2012   HGB 8.3 (L) 03/31/2012   HCT 26.4 (L) 03/31/2012   PLT 235 03/31/2012   GLUCOSE 95 10/17/2016   CHOL  11/16/2008    184        ATP III CLASSIFICATION:  <200     mg/dL   Desirable  200-239  mg/dL   Borderline High  >=240    mg/dL   High          TRIG 157 (H) 09/15/2011  HDL 33 (L) 11/16/2008   LDLCALC (H) 11/16/2008    130        Total Cholesterol/HDL:CHD Risk Coronary Heart Disease Risk Table                     Men   Women  1/2 Average Risk   3.4   3.3  Average Risk       5.0   4.4  2 X Average Risk   9.6   7.1  3 X Average Risk  23.4   11.0        Use the calculated Patient Ratio above and the CHD Risk Table to determine the patient's CHD Risk.        ATP III CLASSIFICATION (LDL):  <100     mg/dL   Optimal  100-129  mg/dL   Near or Above                    Optimal  130-159  mg/dL   Borderline  160-189  mg/dL   High  >190     mg/dL   Very High   ALT 15 03/26/2012   AST 21 03/26/2012   NA 141 10/17/2016   K 4.3 10/17/2016   CL 104 10/17/2016   CREATININE 0.87 10/17/2016   BUN 14 10/17/2016   CO2 28 10/17/2016   TSH 2.655 09/11/2011   INR 0.98 03/25/2012   HGBA1C  11/16/2008    5.5 (NOTE) The ADA recommends the following therapeutic goal for glycemic control related to Hgb A1c measurement: Goal of therapy: <6.5 Hgb A1c  Reference: American Diabetes Association: Clinical Practice Recommendations 2010, Diabetes Care, 2010, 33: (Suppl  1).    Dg Chest 2 View  Result Date: 08/09/2017 CLINICAL DATA:  Cough and congestion. EXAM: CHEST  2 VIEW COMPARISON:  12/08/2011. FINDINGS: Surgical wiring noted over the anterior chest. Cardiomegaly with pulmonary venous congestion bilateral interstitial prominence with small right pleural effusion. Findings consistent CHF. Pneumonitis cannot be excluded. No  pneumothorax. Right apical pleural thickening consistent with scarring. Lap band noted over the left upper quadrant. Degenerative changes both shoulders and thoracic spine. IMPRESSION: Congestive heart failure with pulmonary interstitial edema and small right pleural effusion. Electronically Signed   By: Marcello Moores  Register   On: 08/09/2017 11:52     Assessment & Plan:  Plan  I am having [name] start on guaiFENesin. I am also having her maintain her docusate sodium, aspirin EC, Tacrolimus, mycophenolate, predniSONE, HYDROcodone-acetaminophen, furosemide, sertraline, albuterol, fluticasone, predniSONE, albuterol, carvedilol, promethazine-dextromethorphan, and benzonatate.  Meds ordered this encounter  Medications  . guaiFENesin (MUCINEX) 600 MG 12 hr tablet    Sig: Take 1 tablet (600 mg total) by mouth 2 (two) times daily.    Dispense:  20 tablet    Refill:  2    Problem List Items Addressed This Visit      Unprioritized   Asthma    con't neb/ inh Refer to pulm      Relevant Orders   Ambulatory referral to Pulmonology   Deceased-donor kidney transplant recipient    Per neph at [facility]      PAF (paroxysmal atrial fibrillation) [facility])    Per cardiology       Other Visit Diagnoses    Coronavirus infection    -  Primary   Relevant Medications   guaiFENesin (MUCINEX) 600 MG 12 hr tablet   Other Relevant Orders   Ambulatory referral to Pulmonology  con't inhalers/ neb and will refer to pulm due to con't congestion --- pt has been struggling with this for almost 2 months  Follow-up: Return if symptoms worsen or fail to improve.  [name], DO

## 2017-09-02 NOTE — Assessment & Plan Note (Signed)
Per neph at [facility]

## 2017-09-02 NOTE — Assessment & Plan Note (Signed)
Per cardiology 

## 2017-09-02 NOTE — Progress Notes (Deleted)
Patient ID: [name], female   DOB: [birth year], 72 y.o.   MRN: [id number]    Subjective:  I acted as a Education administrator for Dr. Carollee Herter.  Guerry Bruin, [place]   Patient ID: [name], female    DOB: 1946/05/09, 72 y.o.   MRN: [id number]  Chief Complaint  Patient presents with  . Hospitalization Follow-up    HPI  Patient is in today for hospital follow up.  Patient Care Team: Carollee Herter, Alferd Apa, DO as PCP - General Sueanne Margarita, MD as Consulting Physician (Cardiology) Paralee Cancel, MD as Consulting Physician (Orthopedic Surgery) Fleet Contras, MD as Consulting Physician (Nephrology) Obgyn, Erling Conte   Past Medical History:  Diagnosis Date  . Anemia   . Arthritis    HIP  . Asthma    as a child  . Atrial flutter [facility])    s/p [place]fl[facility] [place] [facility]  . Blood transfusion    never had a reaction to blood transfusion  . Cardiomyopathy Mar 2013   Mild, EF 50-55% by Mar 2013 ECHO, diast dysfxn II  . Closed fracture of right distal radius 10/18/2016  . Constipation    takes Miralax daily  . Depression    takes Zoloft daily  . Distal radius fracture, right   . DVT of lower extremity, bilateral ([place]) 12/21/11   "they're there now; been there for 2 wks"  . Eczema   . ESRD (end stage renal disease) on dialysis [facility]) 09/2011   07-19-2015 had Kidney transplant at [facility]  . Fractures, stress    in both feet--6 OR 7 YRS AGO--RESOLVED  . Gout    doesn't require meds   . HCAP (healthcare-associated pneumonia)    09/10/11  . Hypercalcemia    09/10/11  . Hypertension    takes Diltiazem daily   . Morbid obesity ([place])   . Nonischemic dilated cardiomyopathy ([place])   . Oligouria   . PAF (paroxysmal atrial fibrillation) (HCC)     HX OF CEREBRAL BLEED WHILE ON COUMADIN-SO PT NOT ON ANY BLOOD THINNERS NOW  . Peripheral vascular disease ([place])   . Sleep apnea    uses CPAP nightly  . Stroke [facility]) 2009   denies residual, hemorrhagic now off coumadin  . Stroke due to intracerebral  hemorrhage ([place]) 2009    Past Surgical History:  Procedure Laterality Date  . AV FISTULA PLACEMENT  09/28/2011   Procedure: ARTERIOVENOUS (AV) FISTULA CREATION;  Surgeon: Rosetta Posner, MD;  Location: [place];  Service: Vascular;  Laterality: Left;  . CARDIAC VALVE SURGERY  1960    FOR ATRIAL SEPTAL DEFECT  . CHOLECYSTECTOMY     1980's  . COLONOSCOPY    . CYSTOSCOPY     many yrs ago  . FEMUR FRACTURE SURGERY  03/2011; 09/03/2011   right; "had 2, 2 wk apart in 2012; broke it again 08/2011 & had OR"  . I&D EXTREMITY  09/15/2011   Procedure: IRRIGATION AND DEBRIDEMENT EXTREMITY w REMOVAL OF HARDWARE;  Surgeon: [name], MD;  Location: [place];  Service: Orthopedics;  Laterality: Right;  . INCISION AND DRAINAGE HIP  12/21/2011   Procedure: IRRIGATION AND DEBRIDEMENT HIP;  Surgeon: [name], MD;  Location: [place];  Service: Orthopedics;  Laterality: Right;  I&D RIGHT HIP WITH PLACEMENT ANTIBIOTIC SPACER  . INSERTION OF DIALYSIS CATHETER     Procedure: INSERTION OF DIALYSIS CATHETER;  Surgeon: Rosetta Posner, MD;  Location: [place];  Service: Vascular;  Laterality: Right;  .  IRRIGATION AND DEBRIDEMENT ABSCESS  12/21/11   right hip  . JOINT REPLACEMENT    . KIDNEY TRANSPLANT  07/19/15  . LAPAROSCOPIC GASTRIC BANDING    . OPEN REDUCTION INTERNAL FIXATION (ORIF) DISTAL RADIAL FRACTURE Right 10/18/2016   Procedure: OPEN REDUCTION INTERNAL FIXATION (ORIF) RIGHT DISTAL RADIAL FRACTURE;  Surgeon: Marchia Bond, MD;  Location: [place];  Service: Orthopedics;  Laterality: Right;  . TOTAL HIP ARTHROPLASTY  02/2011   right THA 02/2011, I&D/removal of hardware 09/2011,, repeat I&D Jun 2013, reimplantation R THA 03-26-2012  . TOTAL HIP REVISION  03/25/2012   Procedure: TOTAL HIP REVISION;  Surgeon: [name], MD;  [facility];  Service: Orthopedics;  Laterality: Right;  Right Total Hip Reimplantation  . VENA CAVA FILTER PLACEMENT  11/2011    Family History  Problem Relation Age of Onset    . Cancer Father   . Diabetes Mother   . Hypertension Mother   . Hodgkin's lymphoma Unknown 32       dscd---HODGKINS DISEASE    Social History   Socioeconomic History  . Marital status: Married    Spouse name: Not on file  . Number of children: Not on file  . Years of education: Not on file  . Highest education level: Not on file  Social Needs  . Financial resource strain: Not on file  . Food insecurity - worry: Not on file  . Food insecurity - inability: Not on file  . Transportation needs - medical: Not on file  . Transportation needs - non-medical: Not on file  Occupational History  . Not on file  Tobacco Use  . Smoking status: Never Smoker  . Smokeless tobacco: Never Used  Substance and Sexual Activity  . Alcohol use: No  . Drug use: No  . Sexual activity: No    Birth control/protection: Post-menopausal  Other Topics Concern  . Not on file  Social History Narrative  . Not on file    Outpatient Medications Prior to Visit  Medication Sig Dispense Refill  . albuterol (PROVENTIL HFA;VENTOLIN HFA) 108 (90 Base) MCG/ACT inhaler Inhale 2 puffs into the lungs every 6 (six) hours as needed for wheezing or shortness of breath. 18 g 5  . albuterol (PROVENTIL) (2.5 MG/3ML) 0.083% nebulizer solution Take 3 mLs (2.5 mg total) by nebulization every 6 (six) hours as needed for wheezing or shortness of breath. 75 mL 12  . aspirin EC 81 MG tablet Take 81 mg by mouth.    . benzonatate (TESSALON) 100 MG capsule Take 1 capsule (100 mg total) by mouth 3 (three) times daily. 30 capsule 0  . carvedilol (COREG) 25 MG tablet Take 1 tablet (25 mg total) by mouth 2 (two) times daily. 180 tablet 3  . docusate sodium (COLACE) 100 MG capsule Take 100 mg by mouth daily as needed (For constipation.).     Marland Kitchen fluticasone (FLOVENT HFA) 220 MCG/ACT inhaler Inhale 2 puffs into the lungs 2 (two) times daily. 1 Inhaler 5  . furosemide (LASIX) 40 MG tablet Take 40 mg by mouth daily. Take 2 tablets by mouth  daily (Total 80 mg)    . HYDROcodone-acetaminophen (NORCO) 10-325 MG tablet Take 1-2 tablets by mouth every 6 (six) hours as needed. 50 tablet 0  . mycophenolate (MYFORTIC) 180 MG EC tablet Take 180 mg by mouth 2 (two) times daily.    . predniSONE (DELTASONE) 10 MG tablet TAKE 3 TABLETS PO QD FOR 3 DAYS THEN TAKE 2 TABLETS PO  QD FOR 3 DAYS THEN TAKE 1 TABLET PO QD FOR 3 DAYS THEN TAKE 1/2 TAB PO QD FOR 3 DAYS 20 tablet 0  . predniSONE (DELTASONE) 5 MG tablet Take 5 mg by mouth daily with breakfast.    . promethazine-dextromethorphan (PROMETHAZINE-DM) 6.25-15 MG/5ML syrup Take 5 mLs by mouth 4 (four) times daily as needed for cough. 118 mL 0  . sertraline (ZOLOFT) 50 MG tablet Take 1 tablet (50 mg total) by mouth daily. 90 tablet 3  . Tacrolimus (ENVARSUS XR) 1 MG TB24 Take by mouth.     No facility-administered medications prior to visit.     Allergies  Allergen Reactions  . Ace Inhibitors Other (See Comments)    Reaction unknown  . Warfarin Sodium Other (See Comments)    Caused her to have a stroke    ROS     Objective:    Physical Exam  BP 116/88 (BP Location: Right Arm, Cuff Size: Large)   Pulse (!) 108   Temp 97.8 F (36.6 C) (Oral)   Resp 16   Ht 5\' 5"  (1.651 m)   Wt 231 lb 9.6 oz (105.1 kg)   SpO2 99%   BMI 38.54 kg/m  Wt Readings from Last 3 Encounters:  09/02/17 231 lb 9.6 oz (105.1 kg)  07/31/17 246 lb 3.2 oz (111.7 kg)  07/26/17 249 lb (112.9 kg)   BP Readings from Last 3 Encounters:  09/02/17 116/88  07/31/17 (!) 146/82  07/26/17 138/76     Immunization History  Administered Date(s) Administered  . Influenza Split 03/27/2012  . Influenza Whole 04/29/2008  . Influenza, High Dose Seasonal PF 04/19/2017  . Influenza,inj,Quad PF,6+ Mos 04/24/2013  . Influenza-Unspecified 04/29/2014, 03/17/2015, 04/19/2016  . Pneumococcal Conjugate-13 03/02/2014  . Pneumococcal Polysaccharide-23 04/25/2005, 09/18/2016  . Tdap 05/08/2013    Health Maintenance  Topic  Date Due  . COLONOSCOPY  07/24/2017  . MAMMOGRAM  05/15/2019  . TETANUS/TDAP  05/09/2023  . INFLUENZA VACCINE  Completed  . DEXA SCAN  Completed  . Hepatitis C Screening  Completed  . PNA vac Low Risk Adult  Completed    Lab Results  Component Value Date   WBC 8.1 03/31/2012   HGB 8.3 (L) 03/31/2012   HCT 26.4 (L) 03/31/2012   PLT 235 03/31/2012   GLUCOSE 95 10/17/2016   CHOL  11/16/2008    184        ATP III CLASSIFICATION:  <200     mg/dL   Desirable  200-239  mg/dL   Borderline High  >=240    mg/dL   High          TRIG 157 (H) 09/15/2011   HDL 33 (L) 11/16/2008   LDLCALC (H) 11/16/2008    130        Total Cholesterol/HDL:CHD Risk Coronary Heart Disease Risk Table                     Men   Women  1/2 Average Risk   3.4   3.3  Average Risk       5.0   4.4  2 X Average Risk   9.6   7.1  3 X Average Risk  23.4   11.0        Use the calculated Patient Ratio above and the CHD Risk Table to determine the patient's CHD Risk.        ATP III CLASSIFICATION (LDL):  <100     mg/dL   Optimal  100-129  mg/dL   Near or Above                    Optimal  130-159  mg/dL   Borderline  160-189  mg/dL   High  >190     mg/dL   Very High   ALT 15 03/26/2012   AST 21 03/26/2012   NA 141 10/17/2016   K 4.3 10/17/2016   CL 104 10/17/2016   CREATININE 0.87 10/17/2016   BUN 14 10/17/2016   CO2 28 10/17/2016   TSH 2.655 09/11/2011   INR 0.98 03/25/2012   HGBA1C  11/16/2008    5.5 (NOTE) The ADA recommends the following therapeutic goal for glycemic control related to Hgb A1c measurement: Goal of therapy: <6.5 Hgb A1c  Reference: American Diabetes Association: Clinical Practice Recommendations 2010, Diabetes Care, 2010, 33: (Suppl  1).    Lab Results  Component Value Date   TSH 2.655 09/11/2011   Lab Results  Component Value Date   WBC 8.1 03/31/2012   HGB 8.3 (L) 03/31/2012   HCT 26.4 (L) 03/31/2012   MCV 84.6 03/31/2012   PLT 235 03/31/2012   Lab Results  Component  Value Date   NA 141 10/17/2016   K 4.3 10/17/2016   CO2 28 10/17/2016   GLUCOSE 95 10/17/2016   BUN 14 10/17/2016   CREATININE 0.87 10/17/2016   BILITOT 0.4 03/26/2012   ALKPHOS 95 03/26/2012   AST 21 03/26/2012   ALT 15 03/26/2012   PROT 5.2 (L) 03/26/2012   ALBUMIN 2.2 (L) 03/31/2012   CALCIUM 9.8 10/17/2016   ANIONGAP 9 10/17/2016   Lab Results  Component Value Date   CHOL  11/16/2008    184        ATP III CLASSIFICATION:  <200     mg/dL   Desirable  200-239  mg/dL   Borderline High  >=240    mg/dL   High          Lab Results  Component Value Date   HDL 33 (L) 11/16/2008   Lab Results  Component Value Date   LDLCALC (H) 11/16/2008    130        Total Cholesterol/HDL:CHD Risk Coronary Heart Disease Risk Table                     Men   Women  1/2 Average Risk   3.4   3.3  Average Risk       5.0   4.4  2 X Average Risk   9.6   7.1  3 X Average Risk  23.4   11.0        Use the calculated Patient Ratio above and the CHD Risk Table to determine the patient's CHD Risk.        ATP III CLASSIFICATION (LDL):  <100     mg/dL   Optimal  100-129  mg/dL   Near or Above                    Optimal  130-159  mg/dL   Borderline  160-189  mg/dL   High  >190     mg/dL   Very High   Lab Results  Component Value Date   TRIG 157 (H) 09/15/2011   Lab Results  Component Value Date   CHOLHDL 5.6 11/16/2008   Lab Results  Component Value Date   HGBA1C  11/16/2008    5.5 (NOTE) The  ADA recommends the following therapeutic goal for glycemic control related to Hgb A1c measurement: Goal of therapy: <6.5 Hgb A1c  Reference: American Diabetes Association: Clinical Practice Recommendations 2010, Diabetes Care, 2010, 33: (Suppl  1).         Assessment & Plan:   Problem List Items Addressed This Visit    None      I am having Mariann Laster T. Raya maintain her docusate sodium, aspirin EC, Tacrolimus, mycophenolate, predniSONE, HYDROcodone-acetaminophen, furosemide, sertraline,  albuterol, fluticasone, predniSONE, albuterol, carvedilol, promethazine-dextromethorphan, and benzonatate.  No orders of the defined types were placed in this encounter.   {PROVIDER TO DELETE} Jerene Dilling, CMA

## 2017-09-04 ENCOUNTER — Telehealth: Payer: Self-pay | Admitting: *Deleted

## 2017-09-04 ENCOUNTER — Ambulatory Visit (INDEPENDENT_AMBULATORY_CARE_PROVIDER_SITE_OTHER): Payer: Medicare Other | Admitting: Pulmonary Disease

## 2017-09-04 ENCOUNTER — Encounter: Payer: Self-pay | Admitting: Pulmonary Disease

## 2017-09-04 DIAGNOSIS — J4531 Mild persistent asthma with (acute) exacerbation: Secondary | ICD-10-CM | POA: Diagnosis not present

## 2017-09-04 DIAGNOSIS — J9601 Acute respiratory failure with hypoxia: Secondary | ICD-10-CM | POA: Diagnosis not present

## 2017-09-04 MED ORDER — HYDROCODONE-HOMATROPINE 5-1.5 MG/5ML PO SYRP: 5.0000 mL | ORAL_SOLUTION | Freq: Two times a day (BID) | ORAL | 0 refills | Status: DC

## 2017-09-04 NOTE — Assessment & Plan Note (Signed)
Oxygen level is significantly improved. Can stay off oxygen for longer duration  continue to use this, during sleep

## 2017-09-04 NOTE — Progress Notes (Signed)
Subjective:    Patient ID: [name], female    DOB: 05/24/1946, 72 y.o.   MRN: [id number]  HPI  72 year old never smoker presents for evaluation of chronic cough for the last 6 weeks. She is a renal transplant recipien in 07/2015, end-stage renal disease on dialysis since 2013, history of ICH in 2010 on warfarin and history of DVT/PE status post IVC filter.  She also has heart failure with preserved EF and atrial fibrillation status post ablation in 2017. She was evaluated by her PCP in October 2018 and told that she may have asthma.  She reports asthma as a child but this never bothered her as an adult.  She was given albuterol MDI and a steroid inhaler.  Then in early January due to increased wheezing she was given a steroid Dosepak. She was hospitalized 1/31 for atrial for ablation with RVR and an exacerbation of acute diastolic heart failure.  She was diuresed about 9 pounds per discharge summary in 19 pounds per her husband and her breathing improved significantly.  Respiratory viral panel was positive for coronavirus. She was discharged on 3 L oxygen due to saturations of 68% noted. All these details were obtained from care everywhere after detailed review. Chest x-ray 1/31 showed cardiomegaly with small left effusion and right basilar atelectasis  She feels significantly improved and her breathing is much improved, her weight is a 29 pounds today.  She arrives in a wheelchair and is compliant with oxygen.  She is able to ambulate with a cane. She states that she continues to have a cough that is wet sounding but appears dry.  This is mostly in the daytime and seldom wakes her up from sleep.  She admits that her cough is 50% improved. She has tried over-the-counter medications and also tried Best boy and promethazine DM cough syrup without any significant relief  Past Medical History:  Diagnosis Date  . Anemia   . Arthritis    HIP  . Asthma    as a child  . Atrial  flutter [facility])    s/p aflutter ablation at [facility]  . Blood transfusion    never had a reaction to blood transfusion  . Cardiomyopathy Mar 2013   Mild, EF 50-55% by Mar 2013 ECHO, diast dysfxn II  . Closed fracture of right distal radius 10/18/2016  . Constipation    takes Miralax daily  . Depression    takes Zoloft daily  . Distal radius fracture, right   . DVT of lower extremity, bilateral ([place]) 12/21/11   "they're there now; been there for 2 wks"  . Eczema   . ESRD (end stage renal disease) on dialysis [facility]) 09/2011   07-19-2015 had Kidney transplant at [facility]  . Fractures, stress    in both feet--6 OR 7 YRS AGO--RESOLVED  . Gout    doesn't require meds   . HCAP (healthcare-associated pneumonia)    09/10/11  . Hypercalcemia    09/10/11  . Hypertension    takes Diltiazem daily   . Morbid obesity ([place])   . Nonischemic dilated cardiomyopathy ([place])   . Oligouria   . PAF (paroxysmal atrial fibrillation) (HCC)     HX OF CEREBRAL BLEED WHILE ON COUMADIN-SO PT NOT ON ANY BLOOD THINNERS NOW  . Peripheral vascular disease ([place])   . Sleep apnea    uses CPAP nightly  . Stroke [facility]) 2009   denies residual, hemorrhagic now off coumadin  . Stroke due to intracerebral hemorrhage ([place])  2009    Past Surgical History:  Procedure Laterality Date  . AV FISTULA PLACEMENT  09/28/2011   Procedure: ARTERIOVENOUS (AV) FISTULA CREATION;  Surgeon: Rosetta Posner, MD;  Location: [place];  Service: Vascular;  Laterality: Left;  . CARDIAC VALVE SURGERY  1960    FOR ATRIAL SEPTAL DEFECT  . CHOLECYSTECTOMY     1980's  . COLONOSCOPY    . CYSTOSCOPY     many yrs ago  . FEMUR FRACTURE SURGERY  03/2011; 09/03/2011   right; "had 2, 2 wk apart in 2012; broke it again 08/2011 & had OR"  . I&D EXTREMITY  09/15/2011   Procedure: IRRIGATION AND DEBRIDEMENT EXTREMITY w REMOVAL OF HARDWARE;  Surgeon: [name], MD;  Location: [place];  Service: Orthopedics;  Laterality: Right;  . INCISION AND DRAINAGE HIP  12/21/2011    Procedure: IRRIGATION AND DEBRIDEMENT HIP;  Surgeon: [name], MD;  Location: [place];  Service: Orthopedics;  Laterality: Right;  I&D RIGHT HIP WITH PLACEMENT ANTIBIOTIC SPACER  . INSERTION OF DIALYSIS CATHETER     Procedure: INSERTION OF DIALYSIS CATHETER;  Surgeon: Rosetta Posner, MD;  Location: [place];  Service: Vascular;  Laterality: Right;  . IRRIGATION AND DEBRIDEMENT ABSCESS  12/21/11   right hip  . JOINT REPLACEMENT    . KIDNEY TRANSPLANT  07/19/15  . LAPAROSCOPIC GASTRIC BANDING    . OPEN REDUCTION INTERNAL FIXATION (ORIF) DISTAL RADIAL FRACTURE Right 10/18/2016   Procedure: OPEN REDUCTION INTERNAL FIXATION (ORIF) RIGHT DISTAL RADIAL FRACTURE;  Surgeon: Marchia Bond, MD;  Location: [place];  Service: Orthopedics;  Laterality: Right;  . TOTAL HIP ARTHROPLASTY  02/2011   right THA 02/2011, I&D/removal of hardware 09/2011,, repeat I&D Jun 2013, reimplantation R THA 03-26-2012  . TOTAL HIP REVISION  03/25/2012   Procedure: TOTAL HIP REVISION;  Surgeon: [name], MD;  [facility];  Service: Orthopedics;  Laterality: Right;  Right Total Hip Reimplantation  . VENA CAVA FILTER PLACEMENT  11/2011    Allergies  Allergen Reactions  . Ace Inhibitors Other (See Comments)    Reaction unknown  . Warfarin Sodium Other (See Comments)    Caused her to have a stroke      Social History   Socioeconomic History  . Marital status: Married    Spouse name: Not on file  . Number of children: Not on file  . Years of education: Not on file  . Highest education level: Not on file  Social Needs  . Financial resource strain: Not on file  . Food insecurity - worry: Not on file  . Food insecurity - inability: Not on file  . Transportation needs - medical: Not on file  . Transportation needs - non-medical: Not on file  Occupational History  . Not on file  Tobacco Use  . Smoking status: Never Smoker  . Smokeless tobacco: Never Used  Substance and Sexual Activity  . Alcohol  use: No  . Drug use: No  . Sexual activity: No    Birth control/protection: Post-menopausal  Other Topics Concern  . Not on file  Social History Narrative  . Not on file     Family History  Problem Relation Age of Onset  . Cancer Father   . Diabetes Mother   . Hypertension Mother   . Hodgkin's lymphoma Unknown 32       dscd---HODGKINS DISEASE     Review of Systems Positive for cough, shortness of breath, indigestion, loss of  appetite, irregular heartbeat  Constitutional: negative for anorexia, fevers and sweats  Eyes: negative for irritation, redness and visual disturbance  Ears, nose, mouth, throat, and face: negative for earaches, epistaxis, nasal congestion and sore throat  Respiratory: negative for  sputum and wheezing  Cardiovascular: negative for chest pain,  lower extremity edema, orthopnea, palpitations and syncope  Gastrointestinal: negative for abdominal pain, constipation, diarrhea, melena, nausea and vomiting  Genitourinary:negative for dysuria, frequency and hematuria  Hematologic/lymphatic: negative for bleeding, easy bruising and lymphadenopathy  Musculoskeletal:negative for arthralgias, muscle weakness and stiff joints  Neurological: negative for coordination problems, gait problems, headaches and weakness  Endocrine: negative for diabetic symptoms including polydipsia, polyuria and weight loss      Objective:   Physical Exam  Gen. Pleasant, obese, in no distress, normal affect ENT - no lesions, no post nasal drip, class 2 airway Neck: No JVD, no thyromegaly, no carotid bruits Lungs: no use of accessory muscles, no dullness to percussion, decreased Rt base without rales or rhonchi  Cardiovascular: Rhythm regular, heart sounds  normal, no murmurs or gallops, no peripheral edema Abdomen: soft and non-tender, no hepatosplenomegaly, BS normal. Musculoskeletal: No deformities, no cyanosis or clubbing Neuro:  alert, non focal, no tremors       Assessment  & Plan:

## 2017-09-04 NOTE — Assessment & Plan Note (Signed)
Her symptoms of wheezing in December and Valetta Close have  been related to fluid overload  Would need spirometry once her acute issues are resolved, meanwhile treat as asthmatic bronchitis worsened by recent coronavirus infection.,  Given a prescription for Hycodan for cough that is keeping her awake at night

## 2017-09-04 NOTE — Telephone Encounter (Signed)
Received Home Health Certification and Plan of Care; forwarded to provider/SLS 02/20

## 2017-09-04 NOTE — Patient Instructions (Signed)
Oxygen level is significantly improved. Can stay off oxygen for longer duration  continue to use this, during sleep   Hycodan cough syrup 5 mL twice daily as needed for 2 weeks.

## 2017-09-09 DIAGNOSIS — I420 Dilated cardiomyopathy: Secondary | ICD-10-CM | POA: Diagnosis not present

## 2017-09-09 DIAGNOSIS — I10 Essential (primary) hypertension: Secondary | ICD-10-CM | POA: Diagnosis not present

## 2017-09-09 DIAGNOSIS — I739 Peripheral vascular disease, unspecified: Secondary | ICD-10-CM | POA: Diagnosis not present

## 2017-09-09 DIAGNOSIS — J45909 Unspecified asthma, uncomplicated: Secondary | ICD-10-CM | POA: Diagnosis not present

## 2017-09-09 DIAGNOSIS — I480 Paroxysmal atrial fibrillation: Secondary | ICD-10-CM | POA: Diagnosis not present

## 2017-09-09 DIAGNOSIS — M160 Bilateral primary osteoarthritis of hip: Secondary | ICD-10-CM | POA: Diagnosis not present

## 2017-09-11 DIAGNOSIS — I420 Dilated cardiomyopathy: Secondary | ICD-10-CM | POA: Diagnosis not present

## 2017-09-11 DIAGNOSIS — M160 Bilateral primary osteoarthritis of hip: Secondary | ICD-10-CM | POA: Diagnosis not present

## 2017-09-11 DIAGNOSIS — I10 Essential (primary) hypertension: Secondary | ICD-10-CM | POA: Diagnosis not present

## 2017-09-11 DIAGNOSIS — I739 Peripheral vascular disease, unspecified: Secondary | ICD-10-CM | POA: Diagnosis not present

## 2017-09-11 DIAGNOSIS — J45909 Unspecified asthma, uncomplicated: Secondary | ICD-10-CM | POA: Diagnosis not present

## 2017-09-11 DIAGNOSIS — I480 Paroxysmal atrial fibrillation: Secondary | ICD-10-CM | POA: Diagnosis not present

## 2017-09-16 DIAGNOSIS — I739 Peripheral vascular disease, unspecified: Secondary | ICD-10-CM | POA: Diagnosis not present

## 2017-09-16 DIAGNOSIS — J45909 Unspecified asthma, uncomplicated: Secondary | ICD-10-CM | POA: Diagnosis not present

## 2017-09-16 DIAGNOSIS — M160 Bilateral primary osteoarthritis of hip: Secondary | ICD-10-CM | POA: Diagnosis not present

## 2017-09-16 DIAGNOSIS — I420 Dilated cardiomyopathy: Secondary | ICD-10-CM | POA: Diagnosis not present

## 2017-09-16 DIAGNOSIS — I480 Paroxysmal atrial fibrillation: Secondary | ICD-10-CM | POA: Diagnosis not present

## 2017-09-16 DIAGNOSIS — I10 Essential (primary) hypertension: Secondary | ICD-10-CM | POA: Diagnosis not present

## 2017-09-18 DIAGNOSIS — I480 Paroxysmal atrial fibrillation: Secondary | ICD-10-CM | POA: Diagnosis not present

## 2017-09-18 DIAGNOSIS — J45909 Unspecified asthma, uncomplicated: Secondary | ICD-10-CM | POA: Diagnosis not present

## 2017-09-18 DIAGNOSIS — I420 Dilated cardiomyopathy: Secondary | ICD-10-CM | POA: Diagnosis not present

## 2017-09-18 DIAGNOSIS — I739 Peripheral vascular disease, unspecified: Secondary | ICD-10-CM | POA: Diagnosis not present

## 2017-09-18 DIAGNOSIS — M160 Bilateral primary osteoarthritis of hip: Secondary | ICD-10-CM | POA: Diagnosis not present

## 2017-09-18 DIAGNOSIS — I10 Essential (primary) hypertension: Secondary | ICD-10-CM | POA: Diagnosis not present

## 2017-09-23 ENCOUNTER — Institutional Professional Consult (permissible substitution): Payer: Medicare Other | Admitting: Pulmonary Disease

## 2017-09-23 DIAGNOSIS — I444 Left anterior fascicular block: Secondary | ICD-10-CM | POA: Diagnosis not present

## 2017-09-23 DIAGNOSIS — I255 Ischemic cardiomyopathy: Secondary | ICD-10-CM | POA: Diagnosis not present

## 2017-09-23 DIAGNOSIS — Z940 Kidney transplant status: Secondary | ICD-10-CM | POA: Diagnosis not present

## 2017-09-23 DIAGNOSIS — Z79899 Other long term (current) drug therapy: Secondary | ICD-10-CM | POA: Diagnosis not present

## 2017-09-23 DIAGNOSIS — Z95828 Presence of other vascular implants and grafts: Secondary | ICD-10-CM | POA: Diagnosis not present

## 2017-09-23 DIAGNOSIS — Z86718 Personal history of other venous thrombosis and embolism: Secondary | ICD-10-CM | POA: Diagnosis not present

## 2017-09-23 DIAGNOSIS — I481 Persistent atrial fibrillation: Secondary | ICD-10-CM | POA: Diagnosis not present

## 2017-09-23 DIAGNOSIS — I4891 Unspecified atrial fibrillation: Secondary | ICD-10-CM | POA: Diagnosis not present

## 2017-09-23 DIAGNOSIS — Z7901 Long term (current) use of anticoagulants: Secondary | ICD-10-CM | POA: Diagnosis not present

## 2017-09-23 DIAGNOSIS — I483 Typical atrial flutter: Secondary | ICD-10-CM | POA: Diagnosis not present

## 2017-09-25 DIAGNOSIS — I739 Peripheral vascular disease, unspecified: Secondary | ICD-10-CM | POA: Diagnosis not present

## 2017-09-25 DIAGNOSIS — I420 Dilated cardiomyopathy: Secondary | ICD-10-CM | POA: Diagnosis not present

## 2017-09-25 DIAGNOSIS — J45909 Unspecified asthma, uncomplicated: Secondary | ICD-10-CM | POA: Diagnosis not present

## 2017-09-25 DIAGNOSIS — M160 Bilateral primary osteoarthritis of hip: Secondary | ICD-10-CM | POA: Diagnosis not present

## 2017-09-25 DIAGNOSIS — I10 Essential (primary) hypertension: Secondary | ICD-10-CM | POA: Diagnosis not present

## 2017-09-25 DIAGNOSIS — I480 Paroxysmal atrial fibrillation: Secondary | ICD-10-CM | POA: Diagnosis not present

## 2017-09-27 ENCOUNTER — Encounter: Payer: Self-pay | Admitting: Cardiology

## 2017-09-27 DIAGNOSIS — N182 Chronic kidney disease, stage 2 (mild): Secondary | ICD-10-CM | POA: Diagnosis not present

## 2017-09-27 DIAGNOSIS — M109 Gout, unspecified: Secondary | ICD-10-CM | POA: Diagnosis not present

## 2017-09-27 DIAGNOSIS — R319 Hematuria, unspecified: Secondary | ICD-10-CM | POA: Diagnosis not present

## 2017-09-27 DIAGNOSIS — R809 Proteinuria, unspecified: Secondary | ICD-10-CM | POA: Diagnosis not present

## 2017-09-27 DIAGNOSIS — I129 Hypertensive chronic kidney disease with stage 1 through stage 4 chronic kidney disease, or unspecified chronic kidney disease: Secondary | ICD-10-CM | POA: Diagnosis not present

## 2017-09-27 DIAGNOSIS — N2581 Secondary hyperparathyroidism of renal origin: Secondary | ICD-10-CM | POA: Diagnosis not present

## 2017-09-27 DIAGNOSIS — Z79899 Other long term (current) drug therapy: Secondary | ICD-10-CM | POA: Diagnosis not present

## 2017-09-27 DIAGNOSIS — I770 Arteriovenous fistula, acquired: Secondary | ICD-10-CM | POA: Diagnosis not present

## 2017-09-27 DIAGNOSIS — D899 Disorder involving the immune mechanism, unspecified: Secondary | ICD-10-CM | POA: Diagnosis not present

## 2017-09-27 DIAGNOSIS — Z940 Kidney transplant status: Secondary | ICD-10-CM | POA: Diagnosis not present

## 2017-09-27 DIAGNOSIS — E785 Hyperlipidemia, unspecified: Secondary | ICD-10-CM | POA: Diagnosis not present

## 2017-09-30 DIAGNOSIS — I739 Peripheral vascular disease, unspecified: Secondary | ICD-10-CM | POA: Diagnosis not present

## 2017-09-30 DIAGNOSIS — I10 Essential (primary) hypertension: Secondary | ICD-10-CM | POA: Diagnosis not present

## 2017-09-30 DIAGNOSIS — M160 Bilateral primary osteoarthritis of hip: Secondary | ICD-10-CM | POA: Diagnosis not present

## 2017-09-30 DIAGNOSIS — J45909 Unspecified asthma, uncomplicated: Secondary | ICD-10-CM | POA: Diagnosis not present

## 2017-09-30 DIAGNOSIS — I480 Paroxysmal atrial fibrillation: Secondary | ICD-10-CM | POA: Diagnosis not present

## 2017-09-30 DIAGNOSIS — I420 Dilated cardiomyopathy: Secondary | ICD-10-CM | POA: Diagnosis not present

## 2017-10-02 ENCOUNTER — Encounter: Payer: Self-pay | Admitting: Adult Health

## 2017-10-02 ENCOUNTER — Ambulatory Visit (INDEPENDENT_AMBULATORY_CARE_PROVIDER_SITE_OTHER): Payer: Medicare Other | Admitting: Adult Health

## 2017-10-02 ENCOUNTER — Ambulatory Visit (INDEPENDENT_AMBULATORY_CARE_PROVIDER_SITE_OTHER)
Admission: RE | Admit: 2017-10-02 | Discharge: 2017-10-02 | Disposition: A | Discharge status: Home / Self Care | Payer: Medicare Other | Source: Ambulatory Visit | Attending: Adult Health | Admitting: Adult Health

## 2017-10-02 VITALS — BP 124/88 | HR 92 | Ht 65.0 in | Wt 224.0 lb

## 2017-10-02 DIAGNOSIS — R0602 Shortness of breath: Secondary | ICD-10-CM | POA: Diagnosis not present

## 2017-10-02 DIAGNOSIS — J45901 Unspecified asthma with (acute) exacerbation: Secondary | ICD-10-CM

## 2017-10-02 DIAGNOSIS — J9611 Chronic respiratory failure with hypoxia: Secondary | ICD-10-CM | POA: Diagnosis not present

## 2017-10-02 LAB — NITRIC OXIDE: Nitric Oxide: 15

## 2017-10-02 IMAGING — DX DG CHEST 2V
2 series · 2 of 2 positions shown · non-contrast
Comparison: Two-view chest x-ray [DATE]

CLINICAL DATA: Asthma with acute exacerbation.

EXAM:
CHEST - 2 VIEW

[chest pa]
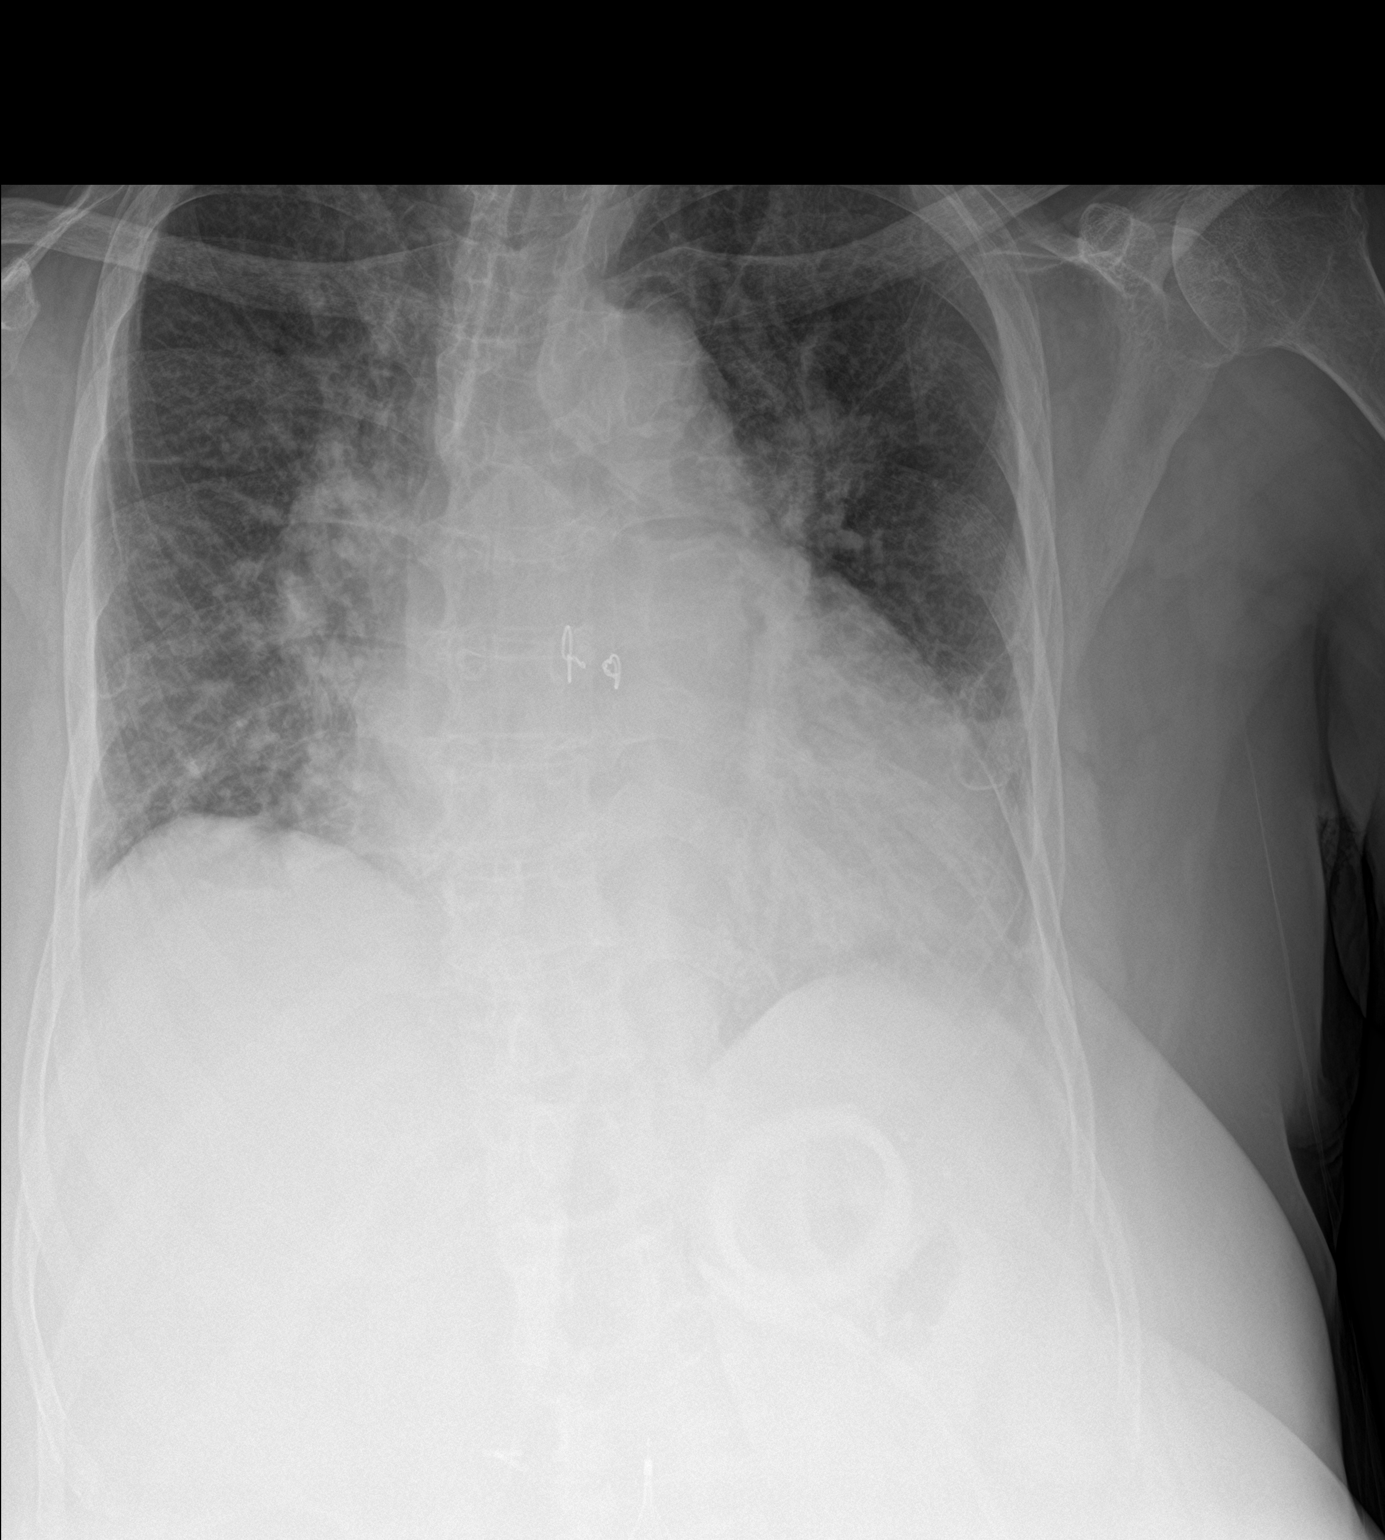

[chest lat]
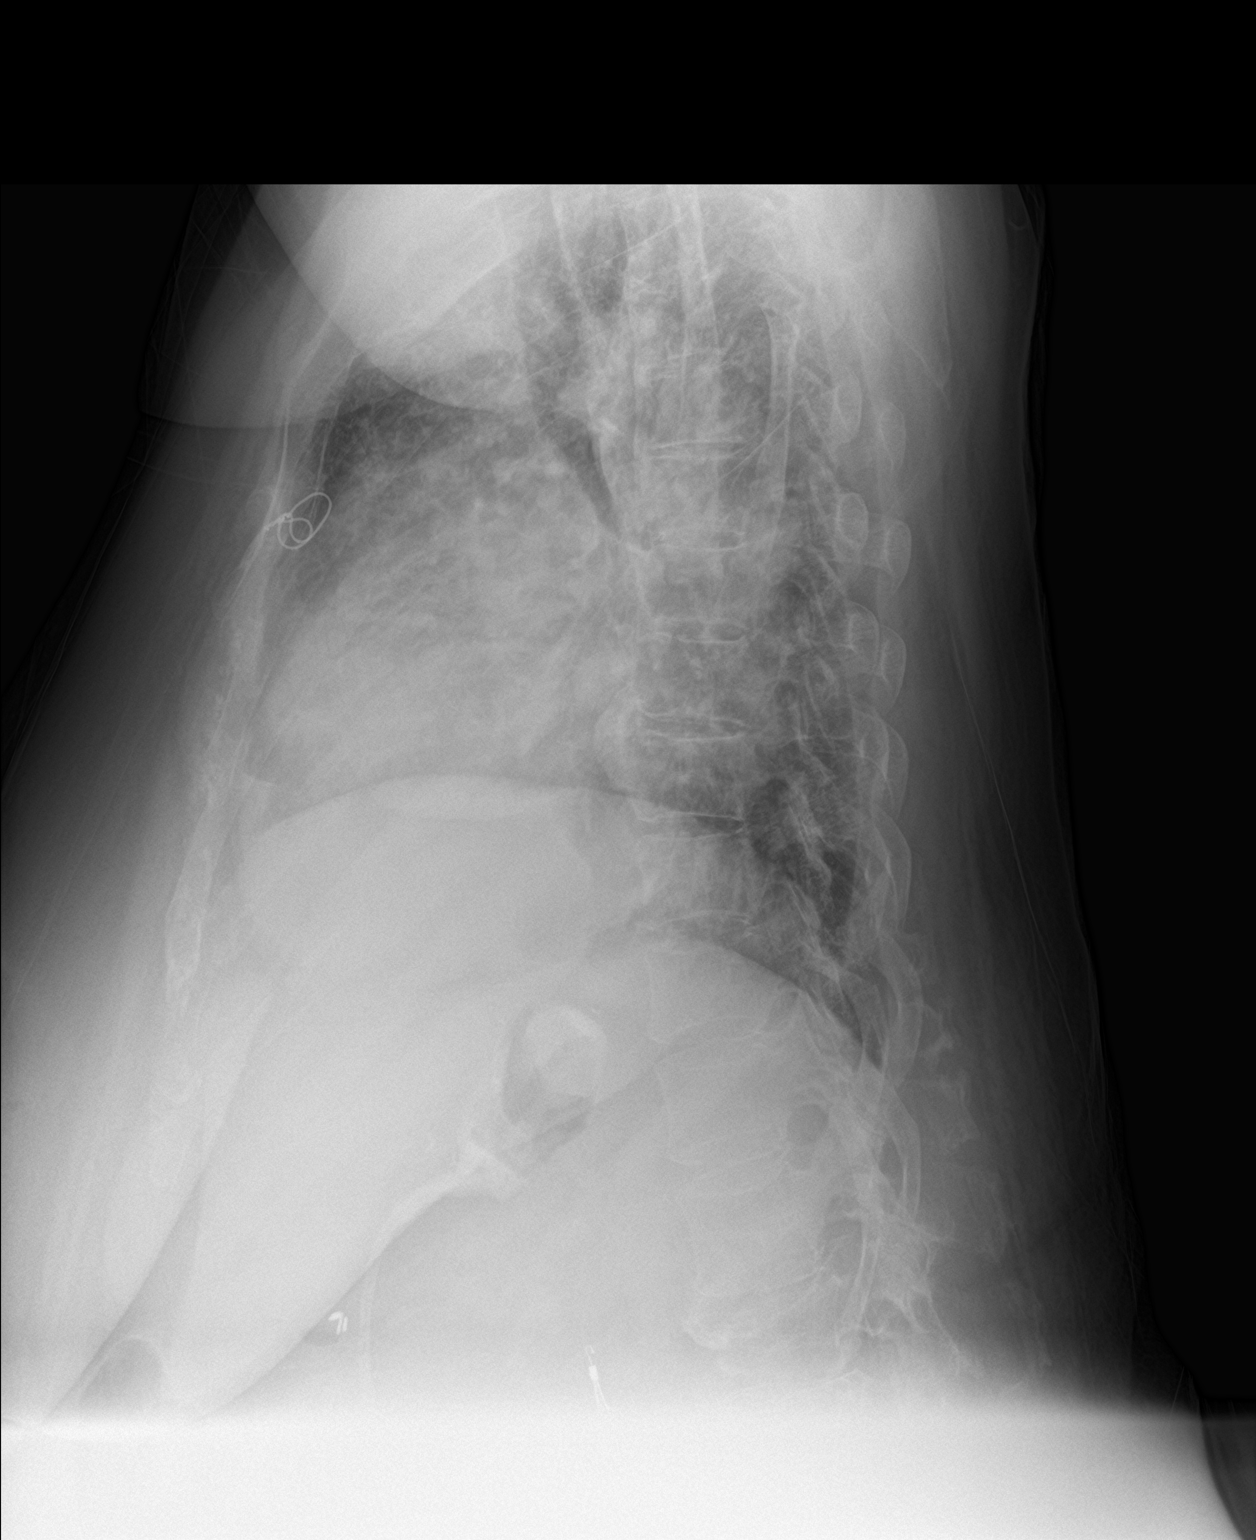

[2 of 2 positions shown; findings below may reference images not displayed]

FINDINGS: Heart is enlarged. Mild edema is present bilaterally. No significant
effusions are present. No focal airspace consolidation is present.
Rightward curvature of the thoracic spine is again seen. IVC filter
is in place. Gastric sleeve is noted.
IMPRESSION: 1. Cardiomegaly and mild edema compatible with congestive heart
failure.
2. No significant focal airspace consolidation.

## 2017-10-02 MED ORDER — BUDESONIDE-FORMOTEROL FUMARATE 160-4.5 MCG/ACT IN AERO
2.0000 | INHALATION_SPRAY | Freq: Two times a day (BID) | RESPIRATORY_TRACT | 5 refills | Status: DC
Start: 2017-10-02 — End: 2017-11-03

## 2017-10-02 MED ORDER — BUDESONIDE-FORMOTEROL FUMARATE 160-4.5 MCG/ACT IN AERO: 2.0000 | INHALATION_SPRAY | Freq: Two times a day (BID) | RESPIRATORY_TRACT | 0 refills | Status: DC

## 2017-10-02 NOTE — Progress Notes (Signed)
Patient seen in the office today and instructed on use of Symbicort 160-4.71mcg.  Patient expressed understanding and demonstrated technique. Parke Poisson, [facility] 10/02/17

## 2017-10-02 NOTE — Progress Notes (Signed)
@Patient  ID: [name], female    DOB: September 02, 1945, 72 y.o.   MRN: [id number]  Chief Complaint  Patient presents with  . Follow-up    Referring provider: [name], *  HPI: 72 year old female never smoker followed for chronic cough, shortness of breath, and asthma Chronic hypoxic respiratory failure on oxygen Complicated past medical history significant for congestive heart failure, A. fib status post ablation, history of DVT\PE status post IVC filter with previous ICH in 2010 on warfarin, end-stage renal disease with previous dialysis status post renal transplant 2017 on immunosuppression  TEST  2D echo August 05, 2017 EF 25-30%,Right ventricle moderately severe reduced systolic function,  11/06/5359 Follow up Asthma Dorothyann Gibbs /o2 RF  Patient presents for a one-month follow-up.  Patient was seen last visit for ongoing cough and shortness of breath.  Last visit oxygen levels were improved she was discontinued off of daytime oxygen.  She continues on oxygen at bedtime.  She says that she has ongoing intermittent cough and congestion.  Patient carries a diagnosis of asthma.  She is on Flovent twice daily.  Spirometry today shows significant restriction with FVC 25%, FEV1 28%.  Ratio 87.  Decreased mid flows. Says that she gets short of breath with activity.  Exhaled nitric oxide testing today at 15 ppb .   Patient has a very complicated medical history with A. fib /congestive heart failure   Allergies  Allergen Reactions  . Ace Inhibitors Other (See Comments)    Reaction unknown  . Warfarin Sodium Other (See Comments)    Caused her to have a stroke    Immunization History  Administered Date(s) Administered  . Influenza Split 03/27/2012  . Influenza Whole 04/29/2008  . Influenza, High Dose Seasonal PF 04/19/2017  . Influenza,inj,Quad PF,6+ Mos 04/24/2013  . Influenza-Unspecified 04/29/2014, 03/17/2015, 04/19/2016  . Pneumococcal Conjugate-13 03/02/2014  .  Pneumococcal Polysaccharide-23 04/25/2005, 09/18/2016  . Tdap 05/08/2013    Past Medical History:  Diagnosis Date  . Anemia   . Arthritis    HIP  . Asthma    as a child  . Atrial flutter [facility])    s/p aflutter ablation at [facility]  . Blood transfusion    never had a reaction to blood transfusion  . Cardiomyopathy Mar 2013   Mild, EF 50-55% by Mar 2013 ECHO, diast dysfxn II  . Closed fracture of right distal radius 10/18/2016  . Constipation    takes Miralax daily  . Depression    takes Zoloft daily  . Distal radius fracture, right   . DVT of lower extremity, bilateral ([place]) 12/21/11   "they're there now; been there for 2 wks"  . Eczema   . ESRD (end stage renal disease) on dialysis [facility]) 09/2011   07-19-2015 had Kidney transplant at [facility]  . Fractures, stress    in both feet--6 OR 7 YRS AGO--RESOLVED  . Gout    doesn't require meds   . HCAP (healthcare-associated pneumonia)    09/10/11  . Hypercalcemia    09/10/11  . Hypertension    takes Diltiazem daily   . Morbid obesity ([place])   . Nonischemic dilated cardiomyopathy ([place])   . Oligouria   . PAF (paroxysmal atrial fibrillation) (HCC)     HX OF CEREBRAL BLEED WHILE ON COUMADIN-SO PT NOT ON ANY BLOOD THINNERS NOW  . Peripheral vascular disease ([place])   . Sleep apnea    uses CPAP nightly  . Stroke [facility]) 2009   denies residual, hemorrhagic now  off coumadin  . Stroke due to intracerebral hemorrhage ([place]) 2009    Tobacco History: Social History   Tobacco Use  Smoking Status Never Smoker  Smokeless Tobacco Never Used   Counseling given: Not Answered   Outpatient Encounter Medications as of 10/02/2017  Medication Sig  . albuterol (PROVENTIL HFA;VENTOLIN HFA) 108 (90 Base) MCG/ACT inhaler Inhale 2 puffs into the lungs every 6 (six) hours as needed for wheezing or shortness of breath.  Marland Kitchen albuterol (PROVENTIL) (2.5 MG/3ML) 0.083% nebulizer solution Take 3 mLs (2.5 mg total) by nebulization every 6 (six) hours as needed for  wheezing or shortness of breath.  Marland Kitchen aspirin EC 81 MG tablet Take 81 mg by mouth.  . benzonatate (TESSALON) 100 MG capsule Take 1 capsule (100 mg total) by mouth 3 (three) times daily.  . carvedilol (COREG) 25 MG tablet Take 1 tablet (25 mg total) by mouth 2 (two) times daily.  Marland Kitchen docusate sodium (COLACE) 100 MG capsule Take 100 mg by mouth daily as needed (For constipation.).   Marland Kitchen fluticasone (FLOVENT HFA) 220 MCG/ACT inhaler Inhale 2 puffs into the lungs 2 (two) times daily.  . furosemide (LASIX) 40 MG tablet Take 40 mg by mouth daily. Take 2 tablets by mouth daily (Total 80 mg)  . guaiFENesin (MUCINEX) 600 MG 12 hr tablet Take 1 tablet (600 mg total) by mouth 2 (two) times daily.  Marland Kitchen HYDROcodone-homatropine (HYCODAN) 5-1.5 MG/5ML syrup Take 5 mLs by mouth 2 (two) times daily.  . mycophenolate (MYFORTIC) 180 MG EC tablet Take 180 mg by mouth 2 (two) times daily.  . promethazine-dextromethorphan (PROMETHAZINE-DM) 6.25-15 MG/5ML syrup Take 5 mLs by mouth 4 (four) times daily as needed for cough.  . rivaroxaban (XARELTO) 20 MG TABS tablet Take 20 mg by mouth daily with supper.  . sertraline (ZOLOFT) 50 MG tablet Take 1 tablet (50 mg total) by mouth daily.  . Tacrolimus (ENVARSUS XR) 1 MG TB24 Take by mouth.   No facility-administered encounter medications on file as of 10/02/2017.      Review of Systems  Constitutional:   No  weight loss, night sweats,  Fevers, chills, fatigue, or  lassitude.  HEENT:   No headaches,  Difficulty swallowing,  Tooth/dental problems, or  Sore throat,                No sneezing, itching, ear ache, nasal congestion, post nasal drip,   CV:  No chest pain,  Orthopnea, PND, swelling in lower extremities, anasarca, dizziness, palpitations, syncope.   GI  No heartburn, indigestion, abdominal pain, nausea, vomiting, diarrhea, change in bowel habits, loss of appetite, bloody stools.   Resp: No shortness of breath with exertion or at rest.  No excess mucus, no productive  cough,  No non-productive cough,  No coughing up of blood.  No change in color of mucus.  No wheezing.  No chest wall deformity  Skin: no rash or lesions.  GU: no dysuria, change in color of urine, no urgency or frequency.  No flank pain, no hematuria   MS:  No joint pain or swelling.  No decreased range of motion.  No back pain.    Physical Exam    GEN: A/Ox3; pleasant , NAD, elderly , obese    HEENT:  [place]/AT,  EACs-clear, TMs-wnl, NOSE-clear, THROAT-clear, no lesions, no postnasal drip or exudate noted.   NECK:  Supple w/ fair ROM; no JVD; normal carotid impulses w/o bruits; no thyromegaly or nodules palpated; no lymphadenopathy.    RESP  Clear  P & A; w/o, wheezes/ rales/ or rhonchi. no accessory muscle use, no dullness to percussion  CARD:  irreg /irreg  no m/r/g, tr  peripheral edema, pulses intact, no cyanosis or clubbing.  GI:   Soft & nt; nml bowel sounds; no organomegaly or masses detected.   Musco: Warm bil, no deformities or joint swelling noted.   Neuro: alert, no focal deficits noted.    Skin: Warm, no lesions or rashes    Lab Results:  CBC  BNP No results found for: BNP  ProBNP No results found for: PROBNP  Imaging: No results found.   Assessment & Plan:   No problem-specific Assessment & Plan notes found for this encounter.     Rexene Edison, NP 10/02/2017

## 2017-10-02 NOTE — Patient Instructions (Addendum)
Change Flovent to Symbicort 160/4.69mcg 2 puffs Twice daily  .  Mucinex .Twice daily  As needed  Cough/congestion  Deslym 2 tsp Twice daily  As needed  Cough  Tessalon Three times a day  As needed  Cough .  Chest xray today .  Follow up with Dr. Elsworth Soho  In 2 months with PFT and As needed

## 2017-10-03 DIAGNOSIS — J9611 Chronic respiratory failure with hypoxia: Secondary | ICD-10-CM | POA: Insufficient documentation

## 2017-10-03 NOTE — Assessment & Plan Note (Addendum)
May have Asthma component  Trial of Symbicort  Cough control    Plan  Patient Instructions  Change Flovent to Symbicort 160/4.33mcg 2 puffs Twice daily  .  Mucinex .Twice daily  As needed  Cough/congestion  Deslym 2 tsp Twice daily  As needed  Cough  Tessalon Three times a day  As needed  Cough .  Chest xray today .  Follow up with Dr. Elsworth Soho  In 2 months with PFT and As needed

## 2017-10-03 NOTE — Assessment & Plan Note (Addendum)
Suspect Dyspnea is multifactoral with underlying CHF /A Fib , deconditioning  Check cxr  Add ICS/LABA for possible Asthma  Control cough  Restrictive changes on spirometry ? Obesity/asthma , CHF -check PFT /DLCO on return   Plan  Patient Instructions  Change Flovent to Symbicort 160/4.51mcg 2 puffs Twice daily  .  Mucinex .Twice daily  As needed  Cough/congestion  Deslym 2 tsp Twice daily  As needed  Cough  Tessalon Three times a day  As needed  Cough .  Chest xray today .  Follow up with Dr. Elsworth Soho  In 2 months with PFT and As needed

## 2017-10-03 NOTE — Assessment & Plan Note (Signed)
Cont on O2 At bedtime   

## 2017-10-04 ENCOUNTER — Ambulatory Visit: Payer: Medicare Other | Admitting: Adult Health

## 2017-10-04 DIAGNOSIS — I081 Rheumatic disorders of both mitral and tricuspid valves: Secondary | ICD-10-CM | POA: Diagnosis present

## 2017-10-04 DIAGNOSIS — I255 Ischemic cardiomyopathy: Secondary | ICD-10-CM | POA: Diagnosis not present

## 2017-10-04 DIAGNOSIS — Z79899 Other long term (current) drug therapy: Secondary | ICD-10-CM | POA: Diagnosis not present

## 2017-10-04 DIAGNOSIS — N186 End stage renal disease: Secondary | ICD-10-CM | POA: Diagnosis not present

## 2017-10-04 DIAGNOSIS — Z95818 Presence of other cardiac implants and grafts: Secondary | ICD-10-CM | POA: Insufficient documentation

## 2017-10-04 DIAGNOSIS — F419 Anxiety disorder, unspecified: Secondary | ICD-10-CM | POA: Diagnosis present

## 2017-10-04 DIAGNOSIS — J45909 Unspecified asthma, uncomplicated: Secondary | ICD-10-CM | POA: Diagnosis present

## 2017-10-04 DIAGNOSIS — G4733 Obstructive sleep apnea (adult) (pediatric): Secondary | ICD-10-CM | POA: Diagnosis present

## 2017-10-04 DIAGNOSIS — Z9889 Other specified postprocedural states: Secondary | ICD-10-CM | POA: Diagnosis not present

## 2017-10-04 DIAGNOSIS — I4892 Unspecified atrial flutter: Secondary | ICD-10-CM | POA: Diagnosis not present

## 2017-10-04 DIAGNOSIS — E1122 Type 2 diabetes mellitus with diabetic chronic kidney disease: Secondary | ICD-10-CM | POA: Diagnosis not present

## 2017-10-04 DIAGNOSIS — I4891 Unspecified atrial fibrillation: Secondary | ICD-10-CM | POA: Diagnosis not present

## 2017-10-04 DIAGNOSIS — I481 Persistent atrial fibrillation: Secondary | ICD-10-CM | POA: Diagnosis not present

## 2017-10-04 DIAGNOSIS — I5032 Chronic diastolic (congestive) heart failure: Secondary | ICD-10-CM | POA: Diagnosis present

## 2017-10-04 DIAGNOSIS — F329 Major depressive disorder, single episode, unspecified: Secondary | ICD-10-CM | POA: Diagnosis present

## 2017-10-04 DIAGNOSIS — Z7901 Long term (current) use of anticoagulants: Secondary | ICD-10-CM | POA: Diagnosis not present

## 2017-10-04 DIAGNOSIS — R918 Other nonspecific abnormal finding of lung field: Secondary | ICD-10-CM | POA: Diagnosis not present

## 2017-10-04 DIAGNOSIS — Z006 Encounter for examination for normal comparison and control in clinical research program: Secondary | ICD-10-CM | POA: Diagnosis not present

## 2017-10-04 DIAGNOSIS — Z7952 Long term (current) use of systemic steroids: Secondary | ICD-10-CM | POA: Diagnosis not present

## 2017-10-04 DIAGNOSIS — Z6830 Body mass index (BMI) 30.0-30.9, adult: Secondary | ICD-10-CM | POA: Diagnosis not present

## 2017-10-04 DIAGNOSIS — I5030 Unspecified diastolic (congestive) heart failure: Secondary | ICD-10-CM | POA: Diagnosis not present

## 2017-10-04 DIAGNOSIS — Z888 Allergy status to other drugs, medicaments and biological substances status: Secondary | ICD-10-CM | POA: Diagnosis not present

## 2017-10-04 DIAGNOSIS — Z95828 Presence of other vascular implants and grafts: Secondary | ICD-10-CM | POA: Diagnosis not present

## 2017-10-04 DIAGNOSIS — Z7982 Long term (current) use of aspirin: Secondary | ICD-10-CM | POA: Diagnosis not present

## 2017-10-04 DIAGNOSIS — Z86711 Personal history of pulmonary embolism: Secondary | ICD-10-CM | POA: Diagnosis not present

## 2017-10-04 DIAGNOSIS — I110 Hypertensive heart disease with heart failure: Secondary | ICD-10-CM | POA: Diagnosis not present

## 2017-10-04 DIAGNOSIS — Z96641 Presence of right artificial hip joint: Secondary | ICD-10-CM | POA: Diagnosis present

## 2017-10-04 DIAGNOSIS — Z8673 Personal history of transient ischemic attack (TIA), and cerebral infarction without residual deficits: Secondary | ICD-10-CM | POA: Diagnosis not present

## 2017-10-04 DIAGNOSIS — Z86718 Personal history of other venous thrombosis and embolism: Secondary | ICD-10-CM | POA: Diagnosis not present

## 2017-10-04 DIAGNOSIS — Z940 Kidney transplant status: Secondary | ICD-10-CM | POA: Diagnosis not present

## 2017-10-04 DIAGNOSIS — Z9989 Dependence on other enabling machines and devices: Secondary | ICD-10-CM | POA: Diagnosis not present

## 2017-10-04 DIAGNOSIS — I517 Cardiomegaly: Secondary | ICD-10-CM | POA: Diagnosis not present

## 2017-10-04 DIAGNOSIS — E669 Obesity, unspecified: Secondary | ICD-10-CM | POA: Diagnosis present

## 2017-10-04 HISTORY — PX: LEFT ATRIAL APPENDAGE OCCLUSION: SHX173A

## 2017-10-04 HISTORY — DX: Presence of other cardiac implants and grafts: Z95.818

## 2017-10-05 MED ORDER — SERTRALINE HCL 50 MG PO TABS
50.00 | ORAL_TABLET | ORAL | Status: DC
Start: 2017-10-06 — End: 2017-10-05

## 2017-10-05 MED ORDER — ASPIRIN EC 81 MG PO TBEC
81.00 | DELAYED_RELEASE_TABLET | ORAL | Status: DC
Start: 2017-10-06 — End: 2017-10-05

## 2017-10-05 MED ORDER — FENTANYL CITRATE (PF) 2500 MCG/50ML IJ SOLN
25.00 | INTRAMUSCULAR | Status: DC
Start: ? — End: 2017-10-05

## 2017-10-05 MED ORDER — CYCLOBENZAPRINE HCL 10 MG PO TABS
10.00 | ORAL_TABLET | ORAL | Status: DC
Start: ? — End: 2017-10-05

## 2017-10-05 MED ORDER — MYCOPHENOLATE SODIUM 180 MG PO TBEC
180.00 | DELAYED_RELEASE_TABLET | ORAL | Status: DC
Start: 2017-10-05 — End: 2017-10-05

## 2017-10-05 MED ORDER — ACETAMINOPHEN 325 MG PO TABS
650.00 | ORAL_TABLET | ORAL | Status: DC
Start: ? — End: 2017-10-05

## 2017-10-05 MED ORDER — FUROSEMIDE 40 MG PO TABS
40.00 | ORAL_TABLET | ORAL | Status: DC
Start: 2017-10-05 — End: 2017-10-05

## 2017-10-05 MED ORDER — NITROGLYCERIN 0.4 MG SL SUBL
0.40 | SUBLINGUAL_TABLET | SUBLINGUAL | Status: DC
Start: ? — End: 2017-10-05

## 2017-10-05 MED ORDER — RIVAROXABAN 20 MG PO TABS
20.00 | ORAL_TABLET | ORAL | Status: DC
Start: 2017-10-05 — End: 2017-10-05

## 2017-10-05 MED ORDER — PREDNISONE 5 MG PO TABS
5.00 | ORAL_TABLET | ORAL | Status: DC
Start: 2017-10-06 — End: 2017-10-05

## 2017-10-05 MED ORDER — CARVEDILOL 12.5 MG PO TABS
25.00 | ORAL_TABLET | ORAL | Status: DC
Start: 2017-10-05 — End: 2017-10-05

## 2017-10-05 MED ORDER — PROMETHAZINE HCL 25 MG/ML IJ SOLN
6.25 | INTRAMUSCULAR | Status: DC
Start: ? — End: 2017-10-05

## 2017-10-05 MED ORDER — TACROLIMUS 1 MG PO TB24
1.00 | ORAL_TABLET | ORAL | Status: DC
Start: 2017-10-06 — End: 2017-10-05

## 2017-10-05 MED ORDER — ALBUTEROL SULFATE HFA 108 (90 BASE) MCG/ACT IN AERS
2.00 | INHALATION_SPRAY | RESPIRATORY_TRACT | Status: DC
Start: ? — End: 2017-10-05

## 2017-10-05 NOTE — Progress Notes (Deleted)
Cardiology Office Note:    Date:  10/05/2017   ID:  [name], DOB [birth year], MRN [id number]  PCP:  [name], DO  Cardiologist:  No primary care provider on file.    Referring MD: Carollee Herter, Alferd Apa, *   No chief complaint on file.   History of Present Illness:    [name] is a 72 y.o. female with a hx of hypertensive DCM, HTN, morbid obesity, PAF followed in afib clinic and OSA.  She has had PAF for years but recently became persistent and was on Cardizem but this was stopped as she was found to have worsening LVF with EF 20-25%.  She has been on Lopressor.  She is not a candidate for anticoagulation due to h/o ICH in 2012 and was told never to be on anticoagulation again. She had an atrial flutter ablation in 2017 in [place] with Dr. Joan Mayans and also had a kidney tx there.  She was seen in afib clinic and Dr. Rayann Heman recommended that she should return to [facility] for heart care due to her very complicated case.   I currently am following her for her PAP therapy.    She saw me in January and had a CPAP titration done that was unsuccessful due to inadequate response to CPAP and ongoing hypoxia but patient id not want to do this.  SHe was started on auto CPAP   Past Medical History:  Diagnosis Date  . Anemia   . Arthritis    HIP  . Asthma    as a child  . Atrial flutter [facility])    s/p aflutter ablation at [facility]  . Blood transfusion    never had a reaction to blood transfusion  . Cardiomyopathy Mar 2013   Mild, EF 50-55% by Mar 2013 ECHO, diast dysfxn II  . Closed fracture of right distal radius 10/18/2016  . Constipation    takes Miralax daily  . Depression    takes Zoloft daily  . Distal radius fracture, right   . DVT of lower extremity, bilateral ([place]) 12/21/11   "they're there now; been there for 2 wks"  . Eczema   . ESRD (end stage renal disease) on dialysis [facility]) 09/2011   07-19-2015 had Kidney transplant at [facility]  . Fractures, stress    in both feet--6 OR 7 YRS  AGO--RESOLVED  . Gout    doesn't require meds   . HCAP (healthcare-associated pneumonia)    09/10/11  . Hypercalcemia    09/10/11  . Hypertension    takes Diltiazem daily   . Morbid obesity ([place])   . Nonischemic dilated cardiomyopathy ([place])   . Oligouria   . PAF (paroxysmal atrial fibrillation) (HCC)     HX OF CEREBRAL BLEED WHILE ON COUMADIN-SO PT NOT ON ANY BLOOD THINNERS NOW  . Peripheral vascular disease ([place])   . Sleep apnea    uses CPAP nightly  . Stroke [facility]) 2009   denies residual, hemorrhagic now off coumadin  . Stroke due to intracerebral hemorrhage ([place]) 2009    Past Surgical History:  Procedure Laterality Date  . AV FISTULA PLACEMENT  09/28/2011   Procedure: ARTERIOVENOUS (AV) FISTULA CREATION;  Surgeon: Rosetta Posner, MD;  Location: [place];  Service: Vascular;  Laterality: Left;  . CARDIAC VALVE SURGERY  1960    FOR ATRIAL SEPTAL DEFECT  . CHOLECYSTECTOMY     1980's  . COLONOSCOPY    . CYSTOSCOPY     many yrs  ago  . FEMUR FRACTURE SURGERY  03/2011; 09/03/2011   right; "had 2, 2 wk apart in 2012; broke it again 08/2011 & had OR"  . I&D EXTREMITY  09/15/2011   Procedure: IRRIGATION AND DEBRIDEMENT EXTREMITY w REMOVAL OF HARDWARE;  Surgeon: [name], MD;  Location: [place];  Service: Orthopedics;  Laterality: Right;  . INCISION AND DRAINAGE HIP  12/21/2011   Procedure: IRRIGATION AND DEBRIDEMENT HIP;  Surgeon: [name], MD;  Location: [place];  Service: Orthopedics;  Laterality: Right;  I&D RIGHT HIP WITH PLACEMENT ANTIBIOTIC SPACER  . INSERTION OF DIALYSIS CATHETER     Procedure: INSERTION OF DIALYSIS CATHETER;  Surgeon: Rosetta Posner, MD;  Location: [place];  Service: Vascular;  Laterality: Right;  . IRRIGATION AND DEBRIDEMENT ABSCESS  12/21/11   right hip  . JOINT REPLACEMENT    . KIDNEY TRANSPLANT  07/19/15  . LAPAROSCOPIC GASTRIC BANDING    . OPEN REDUCTION INTERNAL FIXATION (ORIF) DISTAL RADIAL FRACTURE Right 10/18/2016   Procedure: OPEN REDUCTION INTERNAL FIXATION  (ORIF) RIGHT DISTAL RADIAL FRACTURE;  Surgeon: Marchia Bond, MD;  Location: [place];  Service: Orthopedics;  Laterality: Right;  . TOTAL HIP ARTHROPLASTY  02/2011   right THA 02/2011, I&D/removal of hardware 09/2011,, repeat I&D Jun 2013, reimplantation R THA 03-26-2012  . TOTAL HIP REVISION  03/25/2012   Procedure: TOTAL HIP REVISION;  Surgeon: [name], MD;  Location: [facility];  Service: Orthopedics;  Laterality: Right;  Right Total Hip Reimplantation  . VENA CAVA FILTER PLACEMENT  11/2011    Current Medications: No outpatient medications have been marked as taking for the 10/07/17 encounter (Appointment) with Sueanne Margarita, MD.     Allergies:   Ace inhibitors and Warfarin sodium   Social History   Socioeconomic History  . Marital status: Married    Spouse name: Not on file  . Number of children: Not on file  . Years of education: Not on file  . Highest education level: Not on file  Occupational History  . Not on file  Social Needs  . Financial resource strain: Not on file  . Food insecurity:    Worry: Not on file    Inability: Not on file  . Transportation needs:    Medical: Not on file    Non-medical: Not on file  Tobacco Use  . Smoking status: Never Smoker  . Smokeless tobacco: Never Used  Substance and Sexual Activity  . Alcohol use: No  . Drug use: No  . Sexual activity: Never    Birth control/protection: Post-menopausal  Lifestyle  . Physical activity:    Days per week: Not on file    Minutes per session: Not on file  . Stress: Not on file  Relationships  . Social connections:    Talks on phone: Not on file    Gets together: Not on file    Attends religious service: Not on file    Active member of club or organization: Not on file    Attends meetings of clubs or organizations: Not on file    Relationship status: Not on file  Other Topics Concern  . Not on file  Social History Narrative  . Not on file     Family History: The  patient's ***family history includes Cancer in her father; Diabetes in her mother; Hodgkin's lymphoma (age of onset: 33) in her unknown relative; Hypertension in her mother.  ROS:   Please see the history of present  illness.    ROS  All other systems reviewed and negative.   EKGs/Labs/Other Studies Reviewed:    The following studies were reviewed today: ***  EKG:  EKG is *** ordered today.  The ekg ordered today demonstrates ***  Recent Labs: 10/17/2016: BUN 14; Creatinine, Ser 0.87; Potassium 4.3; Sodium 141   Recent Lipid Panel    Component Value Date/Time   CHOL  11/16/2008 0520    184        ATP III CLASSIFICATION:  <200     mg/dL   Desirable  200-239  mg/dL   Borderline High  >=240    mg/dL   High          TRIG 157 (H) 09/15/2011 0430   HDL 33 (L) 11/16/2008 0520   CHOLHDL 5.6 11/16/2008 0520   VLDL 21 11/16/2008 0520   LDLCALC (H) 11/16/2008 0520    130        Total Cholesterol/HDL:CHD Risk Coronary Heart Disease Risk Table                     Men   Women  1/2 Average Risk   3.4   3.3  Average Risk       5.0   4.4  2 X Average Risk   9.6   7.1  3 X Average Risk  23.4   11.0        Use the calculated Patient Ratio above and the CHD Risk Table to determine the patient's CHD Risk.        ATP III CLASSIFICATION (LDL):  <100     mg/dL   Optimal  100-129  mg/dL   Near or Above                    Optimal  130-159  mg/dL   Borderline  160-189  mg/dL   High  >190     mg/dL   Very High    Physical Exam:    VS:  There were no vitals taken for this visit.    Wt Readings from Last 3 Encounters:  10/02/17 224 lb (101.6 kg)  09/04/17 229 lb (103.9 kg)  09/02/17 231 lb 9.6 oz (105.1 kg)     GEN: *** Well nourished, well developed in no acute distress HEENT: Normal NECK: No JVD; No carotid bruits LYMPHATICS: No lymphadenopathy CARDIAC: ***RRR, no murmurs, rubs, gallops RESPIRATORY:  Clear to auscultation without rales, wheezing or rhonchi  ABDOMEN: Soft,  non-tender, non-distended MUSCULOSKELETAL:  No edema; No deformity  SKIN: Warm and dry NEUROLOGIC:  Alert and oriented x 3 PSYCHIATRIC:  Normal affect   ASSESSMENT:    No diagnosis found. PLAN:    In order of problems listed above:  ***   Medication Adjustments/Labs and Tests Ordered: Current medicines are reviewed at length with the patient today.  Concerns regarding medicines are outlined above.  No orders of the defined types were placed in this encounter.  No orders of the defined types were placed in this encounter.   Signed, Fransico Him, MD  10/05/2017 9:53 PM    [place]

## 2017-10-07 ENCOUNTER — Telehealth: Payer: Self-pay | Admitting: Family Medicine

## 2017-10-07 ENCOUNTER — Ambulatory Visit: Payer: Medicare Other | Admitting: Cardiology

## 2017-10-07 NOTE — Telephone Encounter (Signed)
Copied from [place] [phone]. Topic: General - Other >> Oct 07, 2017  4:35 PM Cecelia Byars, NT wrote: Reason for CRM: [facility] called and need verbal orders for a pt reevaluation from her watchman at [facility]  procedure on 10/04/17 please call  [phone]

## 2017-10-08 ENCOUNTER — Encounter: Payer: Self-pay | Admitting: Cardiology

## 2017-10-09 NOTE — Telephone Encounter (Signed)
Ok for verbal 

## 2017-10-10 DIAGNOSIS — J45909 Unspecified asthma, uncomplicated: Secondary | ICD-10-CM | POA: Diagnosis not present

## 2017-10-10 DIAGNOSIS — M160 Bilateral primary osteoarthritis of hip: Secondary | ICD-10-CM | POA: Diagnosis not present

## 2017-10-10 DIAGNOSIS — I420 Dilated cardiomyopathy: Secondary | ICD-10-CM | POA: Diagnosis not present

## 2017-10-10 DIAGNOSIS — I739 Peripheral vascular disease, unspecified: Secondary | ICD-10-CM | POA: Diagnosis not present

## 2017-10-10 DIAGNOSIS — I10 Essential (primary) hypertension: Secondary | ICD-10-CM | POA: Diagnosis not present

## 2017-10-10 DIAGNOSIS — I480 Paroxysmal atrial fibrillation: Secondary | ICD-10-CM | POA: Diagnosis not present

## 2017-10-10 NOTE — Telephone Encounter (Signed)
Verbal given for OT and PT once a week

## 2017-10-14 ENCOUNTER — Telehealth: Payer: Self-pay | Admitting: *Deleted

## 2017-10-14 NOTE — Telephone Encounter (Addendum)
Received Physician Orders from [facility] [2]; forwarded to provider/SLS 04/01

## 2017-10-14 NOTE — Telephone Encounter (Signed)
Received Physician Orders/PT [2] from [facility]; forwarded to provider/SLS 04/01

## 2017-10-16 DIAGNOSIS — I480 Paroxysmal atrial fibrillation: Secondary | ICD-10-CM | POA: Diagnosis not present

## 2017-10-16 DIAGNOSIS — J45909 Unspecified asthma, uncomplicated: Secondary | ICD-10-CM | POA: Diagnosis not present

## 2017-10-16 DIAGNOSIS — I10 Essential (primary) hypertension: Secondary | ICD-10-CM | POA: Diagnosis not present

## 2017-10-16 DIAGNOSIS — I739 Peripheral vascular disease, unspecified: Secondary | ICD-10-CM | POA: Diagnosis not present

## 2017-10-16 DIAGNOSIS — I420 Dilated cardiomyopathy: Secondary | ICD-10-CM | POA: Diagnosis not present

## 2017-10-16 DIAGNOSIS — M160 Bilateral primary osteoarthritis of hip: Secondary | ICD-10-CM | POA: Diagnosis not present

## 2017-10-20 DIAGNOSIS — I10 Essential (primary) hypertension: Secondary | ICD-10-CM | POA: Diagnosis not present

## 2017-10-20 DIAGNOSIS — J45909 Unspecified asthma, uncomplicated: Secondary | ICD-10-CM | POA: Diagnosis not present

## 2017-10-20 DIAGNOSIS — I480 Paroxysmal atrial fibrillation: Secondary | ICD-10-CM | POA: Diagnosis not present

## 2017-10-20 DIAGNOSIS — I739 Peripheral vascular disease, unspecified: Secondary | ICD-10-CM | POA: Diagnosis not present

## 2017-10-20 DIAGNOSIS — M160 Bilateral primary osteoarthritis of hip: Secondary | ICD-10-CM | POA: Diagnosis not present

## 2017-10-20 DIAGNOSIS — I420 Dilated cardiomyopathy: Secondary | ICD-10-CM | POA: Diagnosis not present

## 2017-10-21 ENCOUNTER — Telehealth: Payer: Self-pay | Admitting: Cardiology

## 2017-10-21 NOTE — Telephone Encounter (Signed)
[name] is calling because on yesterday [name] was visited by [facility] nurse and her blood pressure was 110/78 an, heart rate was 100 plus and oxygen level was at 91. Wanted her to go to the ER on yesterday , but she wants to come in to see Dr. Radford Pax on today . Please call the home number first is no answer call  Mr Sipos cell phone at [phone] .Thanks

## 2017-10-21 NOTE — Telephone Encounter (Signed)
I spoke with patient. She states she has some increased sob with ambulation. Home health nurse came to visit and states O2 sats were at 91%. Patient is s/p watchman on 10/04/17 and patient is scheduled or cardioversion on 10/28/17. I advised patient to follow up with Dr. Fransico Him, cardiologist at [facility]. She stated she will give them a call. I informed her I would forward to Dr. Radford Pax for any further recommendations. She verbalized understanding and thankful for the call

## 2017-10-22 ENCOUNTER — Telehealth: Payer: Self-pay | Admitting: Family Medicine

## 2017-10-22 ENCOUNTER — Telehealth: Payer: Self-pay | Admitting: *Deleted

## 2017-10-22 DIAGNOSIS — I420 Dilated cardiomyopathy: Secondary | ICD-10-CM | POA: Diagnosis not present

## 2017-10-22 DIAGNOSIS — J45909 Unspecified asthma, uncomplicated: Secondary | ICD-10-CM | POA: Diagnosis not present

## 2017-10-22 DIAGNOSIS — I480 Paroxysmal atrial fibrillation: Secondary | ICD-10-CM | POA: Diagnosis not present

## 2017-10-22 DIAGNOSIS — M160 Bilateral primary osteoarthritis of hip: Secondary | ICD-10-CM | POA: Diagnosis not present

## 2017-10-22 DIAGNOSIS — I739 Peripheral vascular disease, unspecified: Secondary | ICD-10-CM | POA: Diagnosis not present

## 2017-10-22 DIAGNOSIS — I10 Essential (primary) hypertension: Secondary | ICD-10-CM | POA: Diagnosis not present

## 2017-10-22 NOTE — Telephone Encounter (Signed)
Verbal orders given  

## 2017-10-22 NOTE — Telephone Encounter (Signed)
Copied from [place] [phone]. Topic: Quick Communication - See Telephone Encounter >> Oct 22, 2017  1:15 PM Corie Chiquito, [place] wrote: CRM for notification. Donita is calling because she needs to have a verbal order for the patient to receive nursing skills 1 time a week for 9 weeks and also for OT so patient could get an evaluation. If someone could give her a call back about this at [phone]

## 2017-10-22 NOTE — Telephone Encounter (Signed)
Received Physician Orders from [facility]; forwarded to provider/SLS 04/09

## 2017-10-23 ENCOUNTER — Telehealth: Payer: Self-pay | Admitting: *Deleted

## 2017-10-23 NOTE — Telephone Encounter (Signed)
Informed patient of results and verbalized understanding was indicated. Patient understands her AHI was in normal range at 0.5 and her comp,iance was good and her current settings will not change. Informed patient she will be contacted by [facility] to get her ONO on cpap done. Patient notified and is agreeable to treatment.

## 2017-10-23 NOTE — Telephone Encounter (Signed)
-----   Message from Sueanne Margarita, MD sent at 10/21/2017  8:43 PM EDT ----- Good AHI and compliance.  Continue current PAP settings.  It does not appear that she has ever gotten her overnight pulse ox on CPAP - please order this

## 2017-10-25 ENCOUNTER — Telehealth: Payer: Self-pay | Admitting: *Deleted

## 2017-10-25 DIAGNOSIS — D631 Anemia in chronic kidney disease: Secondary | ICD-10-CM | POA: Diagnosis not present

## 2017-10-25 DIAGNOSIS — G4733 Obstructive sleep apnea (adult) (pediatric): Secondary | ICD-10-CM | POA: Diagnosis not present

## 2017-10-25 DIAGNOSIS — F329 Major depressive disorder, single episode, unspecified: Secondary | ICD-10-CM | POA: Diagnosis not present

## 2017-10-25 DIAGNOSIS — M160 Bilateral primary osteoarthritis of hip: Secondary | ICD-10-CM | POA: Diagnosis not present

## 2017-10-25 DIAGNOSIS — Z7982 Long term (current) use of aspirin: Secondary | ICD-10-CM | POA: Diagnosis not present

## 2017-10-25 DIAGNOSIS — I420 Dilated cardiomyopathy: Secondary | ICD-10-CM | POA: Diagnosis not present

## 2017-10-25 DIAGNOSIS — I132 Hypertensive heart and chronic kidney disease with heart failure and with stage 5 chronic kidney disease, or end stage renal disease: Secondary | ICD-10-CM | POA: Diagnosis not present

## 2017-10-25 DIAGNOSIS — E1151 Type 2 diabetes mellitus with diabetic peripheral angiopathy without gangrene: Secondary | ICD-10-CM | POA: Diagnosis not present

## 2017-10-25 DIAGNOSIS — Z940 Kidney transplant status: Secondary | ICD-10-CM | POA: Diagnosis not present

## 2017-10-25 DIAGNOSIS — Z992 Dependence on renal dialysis: Secondary | ICD-10-CM | POA: Diagnosis not present

## 2017-10-25 DIAGNOSIS — E1122 Type 2 diabetes mellitus with diabetic chronic kidney disease: Secondary | ICD-10-CM | POA: Diagnosis not present

## 2017-10-25 DIAGNOSIS — I480 Paroxysmal atrial fibrillation: Secondary | ICD-10-CM | POA: Diagnosis not present

## 2017-10-25 DIAGNOSIS — I5030 Unspecified diastolic (congestive) heart failure: Secondary | ICD-10-CM | POA: Diagnosis not present

## 2017-10-25 DIAGNOSIS — N186 End stage renal disease: Secondary | ICD-10-CM | POA: Diagnosis not present

## 2017-10-25 DIAGNOSIS — J45909 Unspecified asthma, uncomplicated: Secondary | ICD-10-CM | POA: Diagnosis not present

## 2017-10-25 NOTE — Telephone Encounter (Signed)
Received Physician Orders from [facility]; forwarded to provider/SLS 04/12

## 2017-10-28 ENCOUNTER — Telehealth: Payer: Self-pay | Admitting: *Deleted

## 2017-10-28 DIAGNOSIS — I481 Persistent atrial fibrillation: Secondary | ICD-10-CM | POA: Diagnosis not present

## 2017-10-28 DIAGNOSIS — I5031 Acute diastolic (congestive) heart failure: Secondary | ICD-10-CM | POA: Diagnosis not present

## 2017-10-28 DIAGNOSIS — Z940 Kidney transplant status: Secondary | ICD-10-CM | POA: Diagnosis not present

## 2017-10-28 DIAGNOSIS — I272 Pulmonary hypertension, unspecified: Secondary | ICD-10-CM | POA: Diagnosis not present

## 2017-10-28 NOTE — Telephone Encounter (Signed)
Ok to give verbal for pt

## 2017-10-28 NOTE — Telephone Encounter (Signed)
Verbal orders given to [facility]

## 2017-10-28 NOTE — Telephone Encounter (Signed)
Copied from [place] [phone]. Topic: Inquiry >> Oct 25, 2017  4:50 PM Oliver Pila B wrote: Reason for CRM: [facility] called to get an advancement on PT orders for the pt, contact [phone]

## 2017-10-29 ENCOUNTER — Telehealth: Payer: Self-pay | Admitting: Family Medicine

## 2017-10-29 ENCOUNTER — Encounter: Payer: Medicare Other | Admitting: Physician Assistant

## 2017-10-29 ENCOUNTER — Encounter: Payer: Self-pay | Admitting: Physician Assistant

## 2017-10-29 ENCOUNTER — Telehealth: Payer: Self-pay | Admitting: *Deleted

## 2017-10-29 VITALS — Ht 65.0 in

## 2017-10-29 DIAGNOSIS — Z9989 Dependence on other enabling machines and devices: Principal | ICD-10-CM

## 2017-10-29 DIAGNOSIS — G4733 Obstructive sleep apnea (adult) (pediatric): Secondary | ICD-10-CM

## 2017-10-29 NOTE — Telephone Encounter (Addendum)
Patient in the office today for a 10 week followup for replacement bipap.  The appt was scheduled incorrectly with an APP.  The patient was rescheduled to Tuesday June 18 at 2:20 with our sleep provider Dr Radford Pax. Patient was not charged for today's visit. Patient is agreeable to treatment.

## 2017-10-29 NOTE — Progress Notes (Signed)
Cardiology Office Note    Date:  10/29/2017   ID:  [name], DOB 29-May-1946, MRN [id number]  PCP:  [name], DO  Cardiologist: No primary care provider on file.  No chief complaint on file.   History of Present Illness:  [name] is a 72 y.o. female appt cancelled    Past Medical History:  Diagnosis Date  . Anemia   . Arthritis    HIP  . Asthma    as a child  . Atrial flutter [facility])    s/p aflutter ablation at [facility]  . Blood transfusion    never had a reaction to blood transfusion  . Cardiomyopathy Mar 2013   Mild, EF 50-55% by Mar 2013 ECHO, diast dysfxn II  . Closed fracture of right distal radius 10/18/2016  . Constipation    takes Miralax daily  . Depression    takes Zoloft daily  . Distal radius fracture, right   . DVT of lower extremity, bilateral ([place]) 12/21/11   "they're there now; been there for 2 wks"  . Eczema   . ESRD (end stage renal disease) on dialysis [facility]) 09/2011   07-19-2015 had Kidney transplant at [facility]  . Fractures, stress    in both feet--6 OR 7 YRS AGO--RESOLVED  . Gout    doesn't require meds   . HCAP (healthcare-associated pneumonia)    09/10/11  . Hypercalcemia    09/10/11  . Hypertension    takes Diltiazem daily   . Morbid obesity ([place])   . Nonischemic dilated cardiomyopathy ([place])   . Oligouria   . PAF (paroxysmal atrial fibrillation) ([facility])     HX OF CEREBRAL BLEED WHILE ON COUMADIN-SO PT NOT ON ANY BLOOD THINNERS NOW  . Peripheral vascular disease ([place])   . Sleep apnea    uses CPAP nightly  . Stroke [facility]) 2009   denies residual, hemorrhagic now off coumadin  . Stroke due to intracerebral hemorrhage ([place]) 2009    Past Surgical History:  Procedure Laterality Date  . AV FISTULA PLACEMENT  09/28/2011   Procedure: ARTERIOVENOUS (AV) FISTULA CREATION;  Surgeon: Rosetta Posner, MD;  Location: [place];  Service: Vascular;  Laterality: Left;  . CARDIAC VALVE SURGERY  1960    FOR ATRIAL SEPTAL DEFECT  .  CHOLECYSTECTOMY     1980's  . COLONOSCOPY    . CYSTOSCOPY     many yrs ago  . FEMUR FRACTURE SURGERY  03/2011; 09/03/2011   right; "had 2, 2 wk apart in 2012; broke it again 08/2011 & had OR"  . I&D EXTREMITY  09/15/2011   Procedure: IRRIGATION AND DEBRIDEMENT EXTREMITY w REMOVAL OF HARDWARE;  Surgeon: [name], MD;  Location: [place];  Service: Orthopedics;  Laterality: Right;  . INCISION AND DRAINAGE HIP  12/21/2011   Procedure: IRRIGATION AND DEBRIDEMENT HIP;  Surgeon: [name], MD;  Location: [place];  Service: Orthopedics;  Laterality: Right;  I&D RIGHT HIP WITH PLACEMENT ANTIBIOTIC SPACER  . INSERTION OF DIALYSIS CATHETER     Procedure: INSERTION OF DIALYSIS CATHETER;  Surgeon: Rosetta Posner, MD;  Location: [place];  Service: Vascular;  Laterality: Right;  . IRRIGATION AND DEBRIDEMENT ABSCESS  12/21/11   right hip  . JOINT REPLACEMENT    . KIDNEY TRANSPLANT  07/19/15  . LAPAROSCOPIC GASTRIC BANDING    . OPEN REDUCTION INTERNAL FIXATION (ORIF) DISTAL RADIAL FRACTURE Right 10/18/2016   Procedure: OPEN REDUCTION INTERNAL FIXATION (ORIF) RIGHT DISTAL RADIAL  FRACTURE;  Surgeon: Marchia Bond, MD;  Location: [place];  Service: Orthopedics;  Laterality: Right;  . TOTAL HIP ARTHROPLASTY  02/2011   right THA 02/2011, I&D/removal of hardware 09/2011,, repeat I&D Jun 2013, reimplantation R THA 03-26-2012  . TOTAL HIP REVISION  03/25/2012   Procedure: TOTAL HIP REVISION;  Surgeon: [name], MD;  Location: [facility];  Service: Orthopedics;  Laterality: Right;  Right Total Hip Reimplantation  . VENA CAVA FILTER PLACEMENT  11/2011    Current Medications: No outpatient medications have been marked as taking for the 10/29/17 encounter (Appointment) with Imogene Burn, PA-C.     Allergies:   Ace inhibitors and Warfarin sodium   Social History   Socioeconomic History  . Marital status: Married    Spouse name: Not on file  . Number of children: Not on file  . Years of education: Not  on file  . Highest education level: Not on file  Occupational History  . Not on file  Social Needs  . Financial resource strain: Not on file  . Food insecurity:    Worry: Not on file    Inability: Not on file  . Transportation needs:    Medical: Not on file    Non-medical: Not on file  Tobacco Use  . Smoking status: Never Smoker  . Smokeless tobacco: Never Used  Substance and Sexual Activity  . Alcohol use: No  . Drug use: No  . Sexual activity: Never    Birth control/protection: Post-menopausal  Lifestyle  . Physical activity:    Days per week: Not on file    Minutes per session: Not on file  . Stress: Not on file  Relationships  . Social connections:    Talks on phone: Not on file    Gets together: Not on file    Attends religious service: Not on file    Active member of club or organization: Not on file    Attends meetings of clubs or organizations: Not on file    Relationship status: Not on file  Other Topics Concern  . Not on file  Social History Narrative  . Not on file     Family History:  The patient   family history includes Cancer in her father; Diabetes in her mother; Hodgkin's lymphoma (age of onset: 65) in her unknown relative; Hypertension in her mother.   ROS:   Please see the history of present illness.    ROS All other systems reviewed and are negative.   PHYSICAL EXAM:   VS:  There were no vitals taken for this visit.  Physical Exam  GEN: Well nourished, well developed, in no acute distress  HEENT: normal  Neck: no JVD, carotid bruits, or masses Cardiac:RRR; no murmurs, rubs, or gallops  Respiratory:  clear to auscultation bilaterally, normal work of breathing GI: soft, nontender, nondistended, + BS Ext: without cyanosis, clubbing, or edema, Good distal pulses bilaterally MS: no deformity or atrophy  Skin: warm and dry, no rash Neuro:  Alert and Oriented x 3, Strength and sensation are intact Psych: euthymic mood, full affect  Wt Readings  from Last 3 Encounters:  10/02/17 224 lb (101.6 kg)  09/04/17 229 lb (103.9 kg)  09/02/17 231 lb 9.6 oz (105.1 kg)      Studies/Labs Reviewed:   EKG:  EKG is ordered today.  The ekg ordered today demonstrates   Recent Labs: No results found for requested labs within last 8760 hours.   Lipid  Panel    Component Value Date/Time   CHOL  11/16/2008 0520    184        ATP III CLASSIFICATION:  <200     mg/dL   Desirable  200-239  mg/dL   Borderline High  >=240    mg/dL   High          TRIG 157 (H) 09/15/2011 0430   HDL 33 (L) 11/16/2008 0520   CHOLHDL 5.6 11/16/2008 0520   VLDL 21 11/16/2008 0520   LDLCALC (H) 11/16/2008 0520    130        Total Cholesterol/HDL:CHD Risk Coronary Heart Disease Risk Table                     Men   Women  1/2 Average Risk   3.4   3.3  Average Risk       5.0   4.4  2 X Average Risk   9.6   7.1  3 X Average Risk  23.4   11.0        Use the calculated Patient Ratio above and the CHD Risk Table to determine the patient's CHD Risk.        ATP III CLASSIFICATION (LDL):  <100     mg/dL   Optimal  100-129  mg/dL   Near or Above                    Optimal  130-159  mg/dL   Borderline  160-189  mg/dL   High  >190     mg/dL   Very High    Additional studies/ records that were reviewed today include:      ASSESSMENT:    No diagnosis found.   PLAN:  In order of problems listed above:      Medication Adjustments/Labs and Tests Ordered: Current medicines are reviewed at length with the patient today.  Concerns regarding medicines are outlined above.  Medication changes, Labs and Tests ordered today are listed in the Patient Instructions below. There are no Patient Instructions on file for this visit.   Sumner Boast, PA-C  10/29/2017 12:27 PM    [facility] [place], [place], [place]  [address] Phone: [phone]; Fax: [phone]

## 2017-10-29 NOTE — Telephone Encounter (Signed)
Copied from [place] [id number]. Topic: Quick Communication - See Telephone Encounter >> Oct 29, 2017  1:38 PM Robina Ade, Helene Kelp D wrote: CRM for notification. See Telephone encounter for: 10/29/17. Patient called and would like to talk to Dr. Etter Sjogren or her CMA about her lost of appetite before she schedule an appt. And is concern about it.

## 2017-10-30 ENCOUNTER — Telehealth: Payer: Self-pay | Admitting: Pulmonary Disease

## 2017-10-30 DIAGNOSIS — N186 End stage renal disease: Secondary | ICD-10-CM | POA: Diagnosis not present

## 2017-10-30 DIAGNOSIS — I132 Hypertensive heart and chronic kidney disease with heart failure and with stage 5 chronic kidney disease, or end stage renal disease: Secondary | ICD-10-CM | POA: Diagnosis not present

## 2017-10-30 DIAGNOSIS — I420 Dilated cardiomyopathy: Secondary | ICD-10-CM | POA: Diagnosis not present

## 2017-10-30 DIAGNOSIS — I5030 Unspecified diastolic (congestive) heart failure: Secondary | ICD-10-CM | POA: Diagnosis not present

## 2017-10-30 DIAGNOSIS — I480 Paroxysmal atrial fibrillation: Secondary | ICD-10-CM | POA: Diagnosis not present

## 2017-10-30 DIAGNOSIS — E1122 Type 2 diabetes mellitus with diabetic chronic kidney disease: Secondary | ICD-10-CM | POA: Diagnosis not present

## 2017-10-30 NOTE — Telephone Encounter (Signed)
Spoke with Ebony Hail at [facility]. She is aware of Dr. Bari Mantis response. Nothing further was needed.

## 2017-10-30 NOTE — Telephone Encounter (Signed)
Symbicort his inhaled medication and hardly gets into the system to cause interaction with amiodarone. It can cause increased heart rate-but generally does not do this if taken as prescribed which was twice daily. So okay to continue in my opinion-we will discuss more on a follow-up visit with [name] She can discuss with her cardiologist if she still has concern

## 2017-10-30 NOTE — Telephone Encounter (Signed)
Patient states that she does not feel like eating at all.  Advised that she needs an ov.  appt made for 11/04/17

## 2017-10-30 NOTE — Telephone Encounter (Signed)
Called [facility] and spoke with Ebony Hail. States that her Symbicort and Amiodarone interact with each other. There is a possible QT prolongation and arrhythmias when these medications are taken together.  Dr. Elsworth Soho - please advise. Thanks!

## 2017-10-31 DIAGNOSIS — I132 Hypertensive heart and chronic kidney disease with heart failure and with stage 5 chronic kidney disease, or end stage renal disease: Secondary | ICD-10-CM | POA: Diagnosis not present

## 2017-10-31 DIAGNOSIS — I5030 Unspecified diastolic (congestive) heart failure: Secondary | ICD-10-CM | POA: Diagnosis not present

## 2017-10-31 DIAGNOSIS — I420 Dilated cardiomyopathy: Secondary | ICD-10-CM | POA: Diagnosis not present

## 2017-10-31 DIAGNOSIS — I480 Paroxysmal atrial fibrillation: Secondary | ICD-10-CM | POA: Diagnosis not present

## 2017-10-31 DIAGNOSIS — E1122 Type 2 diabetes mellitus with diabetic chronic kidney disease: Secondary | ICD-10-CM | POA: Diagnosis not present

## 2017-10-31 DIAGNOSIS — N186 End stage renal disease: Secondary | ICD-10-CM | POA: Diagnosis not present

## 2017-11-01 ENCOUNTER — Telehealth: Payer: Self-pay | Admitting: Family Medicine

## 2017-11-01 DIAGNOSIS — N186 End stage renal disease: Secondary | ICD-10-CM | POA: Diagnosis not present

## 2017-11-01 DIAGNOSIS — I5030 Unspecified diastolic (congestive) heart failure: Secondary | ICD-10-CM | POA: Diagnosis not present

## 2017-11-01 DIAGNOSIS — I480 Paroxysmal atrial fibrillation: Secondary | ICD-10-CM | POA: Diagnosis not present

## 2017-11-01 DIAGNOSIS — I132 Hypertensive heart and chronic kidney disease with heart failure and with stage 5 chronic kidney disease, or end stage renal disease: Secondary | ICD-10-CM | POA: Diagnosis not present

## 2017-11-01 DIAGNOSIS — E1122 Type 2 diabetes mellitus with diabetic chronic kidney disease: Secondary | ICD-10-CM | POA: Diagnosis not present

## 2017-11-01 DIAGNOSIS — I420 Dilated cardiomyopathy: Secondary | ICD-10-CM | POA: Diagnosis not present

## 2017-11-01 NOTE — Telephone Encounter (Signed)
Copied from [place] [id number]. Topic: Inquiry >> Nov 01, 2017  2:02 PM Conception Chancy, NT wrote: Reason for CRM: Sharee Pimple is a RN with [facility] and states the patient has a appt on 11/04/17 and would like to get urine checked considering it has a foul smell to it.

## 2017-11-03 ENCOUNTER — Emergency Department (HOSPITAL_COMMUNITY): Payer: Medicare Other

## 2017-11-03 ENCOUNTER — Inpatient Hospital Stay (HOSPITAL_COMMUNITY)
Admission: EM | Admit: 2017-11-03 | Discharge: 2017-11-04 | DRG: 871 | Disposition: A | Discharge status: Other Acute Inpatient Hospital | Payer: Medicare Other | Attending: Critical Care Medicine | Admitting: Critical Care Medicine

## 2017-11-03 ENCOUNTER — Encounter (HOSPITAL_COMMUNITY): Payer: Self-pay

## 2017-11-03 ENCOUNTER — Other Ambulatory Visit: Payer: Self-pay

## 2017-11-03 DIAGNOSIS — N3000 Acute cystitis without hematuria: Secondary | ICD-10-CM

## 2017-11-03 DIAGNOSIS — I4892 Unspecified atrial flutter: Secondary | ICD-10-CM | POA: Diagnosis present

## 2017-11-03 DIAGNOSIS — N390 Urinary tract infection, site not specified: Secondary | ICD-10-CM | POA: Diagnosis present

## 2017-11-03 DIAGNOSIS — R531 Weakness: Secondary | ICD-10-CM | POA: Diagnosis not present

## 2017-11-03 DIAGNOSIS — Z7901 Long term (current) use of anticoagulants: Secondary | ICD-10-CM

## 2017-11-03 DIAGNOSIS — J181 Lobar pneumonia, unspecified organism: Secondary | ICD-10-CM | POA: Diagnosis not present

## 2017-11-03 DIAGNOSIS — T8619 Other complication of kidney transplant: Secondary | ICD-10-CM | POA: Diagnosis present

## 2017-11-03 DIAGNOSIS — J9601 Acute respiratory failure with hypoxia: Secondary | ICD-10-CM | POA: Diagnosis present

## 2017-11-03 DIAGNOSIS — G4733 Obstructive sleep apnea (adult) (pediatric): Secondary | ICD-10-CM | POA: Diagnosis present

## 2017-11-03 DIAGNOSIS — Z79899 Other long term (current) drug therapy: Secondary | ICD-10-CM

## 2017-11-03 DIAGNOSIS — Y830 Surgical operation with transplant of whole organ as the cause of abnormal reaction of the patient, or of later complication, without mention of misadventure at the time of the procedure: Secondary | ICD-10-CM | POA: Diagnosis present

## 2017-11-03 DIAGNOSIS — M109 Gout, unspecified: Secondary | ICD-10-CM | POA: Diagnosis present

## 2017-11-03 DIAGNOSIS — Z8673 Personal history of transient ischemic attack (TIA), and cerebral infarction without residual deficits: Secondary | ICD-10-CM

## 2017-11-03 DIAGNOSIS — Z22322 Carrier or suspected carrier of Methicillin resistant Staphylococcus aureus: Secondary | ICD-10-CM | POA: Diagnosis not present

## 2017-11-03 DIAGNOSIS — Z7982 Long term (current) use of aspirin: Secondary | ICD-10-CM

## 2017-11-03 DIAGNOSIS — R6521 Severe sepsis with septic shock: Secondary | ICD-10-CM | POA: Diagnosis not present

## 2017-11-03 DIAGNOSIS — R0602 Shortness of breath: Secondary | ICD-10-CM | POA: Diagnosis not present

## 2017-11-03 DIAGNOSIS — I5020 Unspecified systolic (congestive) heart failure: Secondary | ICD-10-CM | POA: Diagnosis not present

## 2017-11-03 DIAGNOSIS — I447 Left bundle-branch block, unspecified: Secondary | ICD-10-CM | POA: Diagnosis not present

## 2017-11-03 DIAGNOSIS — Z7951 Long term (current) use of inhaled steroids: Secondary | ICD-10-CM

## 2017-11-03 DIAGNOSIS — I110 Hypertensive heart disease with heart failure: Secondary | ICD-10-CM | POA: Diagnosis present

## 2017-11-03 DIAGNOSIS — N179 Acute kidney failure, unspecified: Secondary | ICD-10-CM

## 2017-11-03 DIAGNOSIS — J45909 Unspecified asthma, uncomplicated: Secondary | ICD-10-CM | POA: Diagnosis present

## 2017-11-03 DIAGNOSIS — R197 Diarrhea, unspecified: Secondary | ICD-10-CM | POA: Diagnosis not present

## 2017-11-03 DIAGNOSIS — Z944 Liver transplant status: Secondary | ICD-10-CM | POA: Diagnosis not present

## 2017-11-03 DIAGNOSIS — Z86718 Personal history of other venous thrombosis and embolism: Secondary | ICD-10-CM

## 2017-11-03 DIAGNOSIS — I509 Heart failure, unspecified: Secondary | ICD-10-CM | POA: Diagnosis present

## 2017-11-03 DIAGNOSIS — N3001 Acute cystitis with hematuria: Secondary | ICD-10-CM | POA: Diagnosis not present

## 2017-11-03 DIAGNOSIS — N186 End stage renal disease: Secondary | ICD-10-CM | POA: Diagnosis not present

## 2017-11-03 DIAGNOSIS — D72829 Elevated white blood cell count, unspecified: Secondary | ICD-10-CM | POA: Diagnosis present

## 2017-11-03 DIAGNOSIS — Z6837 Body mass index (BMI) 37.0-37.9, adult: Secondary | ICD-10-CM

## 2017-11-03 DIAGNOSIS — I420 Dilated cardiomyopathy: Secondary | ICD-10-CM | POA: Diagnosis present

## 2017-11-03 DIAGNOSIS — I480 Paroxysmal atrial fibrillation: Secondary | ICD-10-CM | POA: Diagnosis present

## 2017-11-03 DIAGNOSIS — I132 Hypertensive heart and chronic kidney disease with heart failure and with stage 5 chronic kidney disease, or end stage renal disease: Secondary | ICD-10-CM | POA: Diagnosis not present

## 2017-11-03 DIAGNOSIS — J189 Pneumonia, unspecified organism: Secondary | ICD-10-CM | POA: Diagnosis not present

## 2017-11-03 DIAGNOSIS — I4891 Unspecified atrial fibrillation: Secondary | ICD-10-CM | POA: Diagnosis not present

## 2017-11-03 DIAGNOSIS — Z952 Presence of prosthetic heart valve: Secondary | ICD-10-CM

## 2017-11-03 DIAGNOSIS — A419 Sepsis, unspecified organism: Principal | ICD-10-CM

## 2017-11-03 DIAGNOSIS — Z95818 Presence of other cardiac implants and grafts: Secondary | ICD-10-CM | POA: Diagnosis not present

## 2017-11-03 DIAGNOSIS — F329 Major depressive disorder, single episode, unspecified: Secondary | ICD-10-CM | POA: Diagnosis present

## 2017-11-03 DIAGNOSIS — I739 Peripheral vascular disease, unspecified: Secondary | ICD-10-CM | POA: Diagnosis present

## 2017-11-03 DIAGNOSIS — Z96641 Presence of right artificial hip joint: Secondary | ICD-10-CM | POA: Diagnosis present

## 2017-11-03 DIAGNOSIS — I481 Persistent atrial fibrillation: Secondary | ICD-10-CM | POA: Diagnosis not present

## 2017-11-03 LAB — TROPONIN I: Troponin I: 0.09 ng/mL (ref <0.03)

## 2017-11-03 LAB — CBC
HCT: 26.8 % — ABNORMAL LOW (ref 36.0–46.0)
Hemoglobin: 8.7 g/dL — ABNORMAL LOW (ref 12.0–15.0)
MCH: 25 pg — ABNORMAL LOW (ref 26.0–34.0)
MCHC: 32.5 g/dL (ref 30.0–36.0)
MCV: 77 fL — ABNORMAL LOW (ref 78.0–100.0)
Platelets: 259 K/uL (ref 150–400)
RBC: 3.48 MIL/uL — ABNORMAL LOW (ref 3.87–5.11)
RDW: 20.3 % — ABNORMAL HIGH (ref 11.5–15.5)
WBC: 32.7 K/uL — ABNORMAL HIGH (ref 4.0–10.5)

## 2017-11-03 LAB — URINALYSIS, ROUTINE W REFLEX MICROSCOPIC
Bilirubin Urine: NEGATIVE
Glucose, UA: NEGATIVE mg/dL
Ketones, ur: NEGATIVE mg/dL
Nitrite: NEGATIVE
Protein, ur: 30 mg/dL — AB
Specific Gravity, Urine: 1.014 (ref 1.005–1.030)
pH: 5 (ref 5.0–8.0)

## 2017-11-03 LAB — PROCALCITONIN: Procalcitonin: 31.11 ng/mL

## 2017-11-03 LAB — COMPREHENSIVE METABOLIC PANEL
ALT: 20 U/L (ref 14–54)
AST: 17 U/L (ref 15–41)
Albumin: 2.6 g/dL — ABNORMAL LOW (ref 3.5–5.0)
Alkaline Phosphatase: 81 U/L (ref 38–126)
Anion gap: 13 (ref 5–15)
BUN: 52 mg/dL — ABNORMAL HIGH (ref 6–20)
CO2: 28 mmol/L (ref 22–32)
Calcium: 9 mg/dL (ref 8.9–10.3)
Chloride: 97 mmol/L — ABNORMAL LOW (ref 101–111)
Creatinine, Ser: 3.47 mg/dL — ABNORMAL HIGH (ref 0.44–1.00)
GFR calc Af Amer: 14 mL/min — ABNORMAL LOW (ref >60)
GFR calc non Af Amer: 12 mL/min — ABNORMAL LOW (ref >60)
Glucose, Bld: 136 mg/dL — ABNORMAL HIGH (ref 65–99)
Potassium: 3.2 mmol/L — ABNORMAL LOW (ref 3.5–5.1)
Sodium: 138 mmol/L (ref 135–145)
Total Bilirubin: 1.4 mg/dL — ABNORMAL HIGH (ref 0.3–1.2)
Total Protein: 6.4 g/dL — ABNORMAL LOW (ref 6.5–8.1)

## 2017-11-03 LAB — BRAIN NATRIURETIC PEPTIDE: B Natriuretic Peptide: 525.4 pg/mL — ABNORMAL HIGH (ref 0.0–100.0)

## 2017-11-03 LAB — APTT: aPTT: 35 seconds (ref 24–36)

## 2017-11-03 LAB — PROTIME-INR
INR: 1.9
Prothrombin Time: 21.7 seconds — ABNORMAL HIGH (ref 11.4–15.2)

## 2017-11-03 LAB — LIPASE, BLOOD: Lipase: 30 U/L (ref 11–51)

## 2017-11-03 LAB — I-STAT TROPONIN, ED: Troponin i, poc: 0.08 ng/mL (ref 0.00–0.08)

## 2017-11-03 LAB — I-STAT CG4 LACTIC ACID, ED: Lactic Acid, Venous: 1.28 mmol/L (ref 0.5–1.9)

## 2017-11-03 IMAGING — CR DG CHEST 2V
2 series · 2 of 2 positions shown · non-contrast
Comparison: [DATE]

CLINICAL DATA: Shortness of breath, weakness, loss of appetite

EXAM:
CHEST - 2 VIEW

[w chest pa]
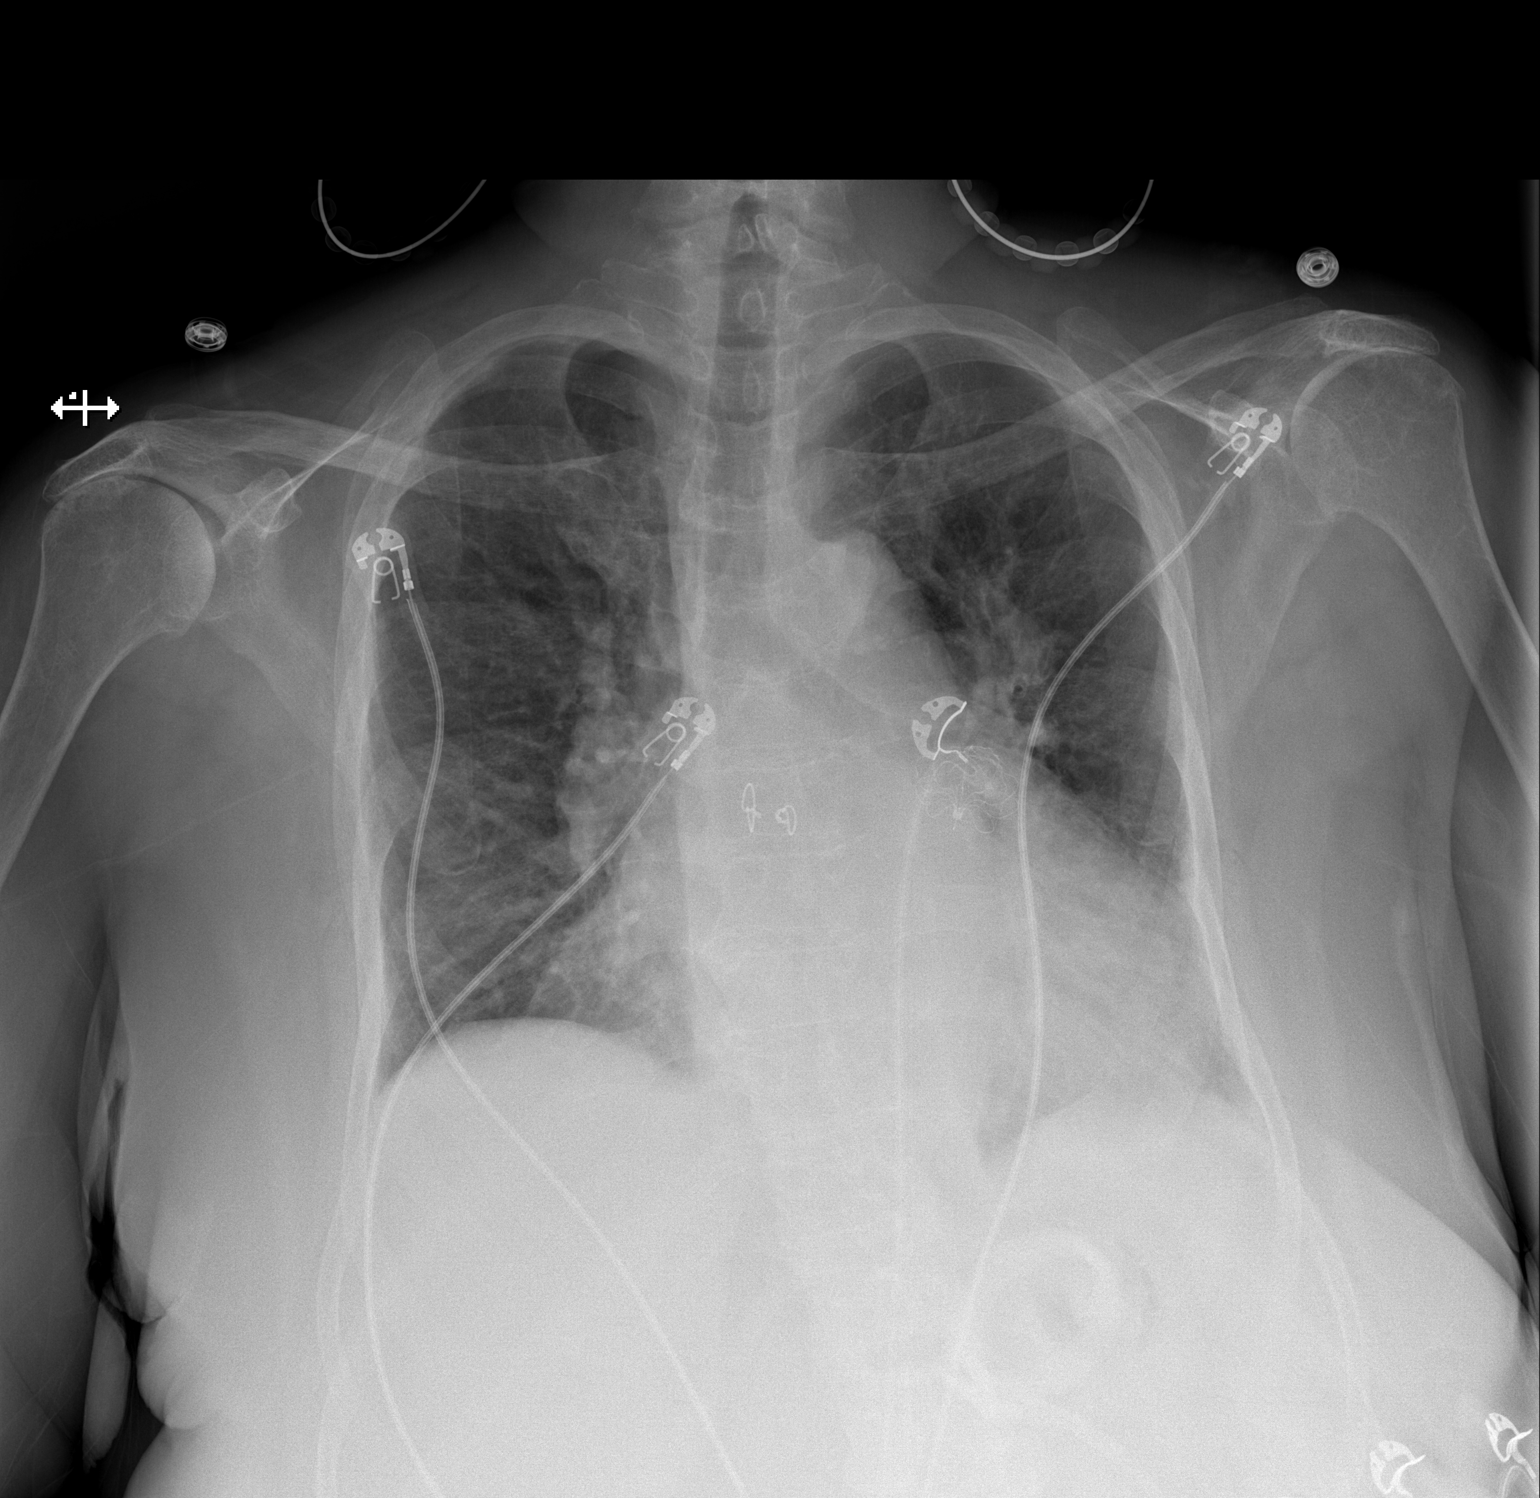

[w chest lat]
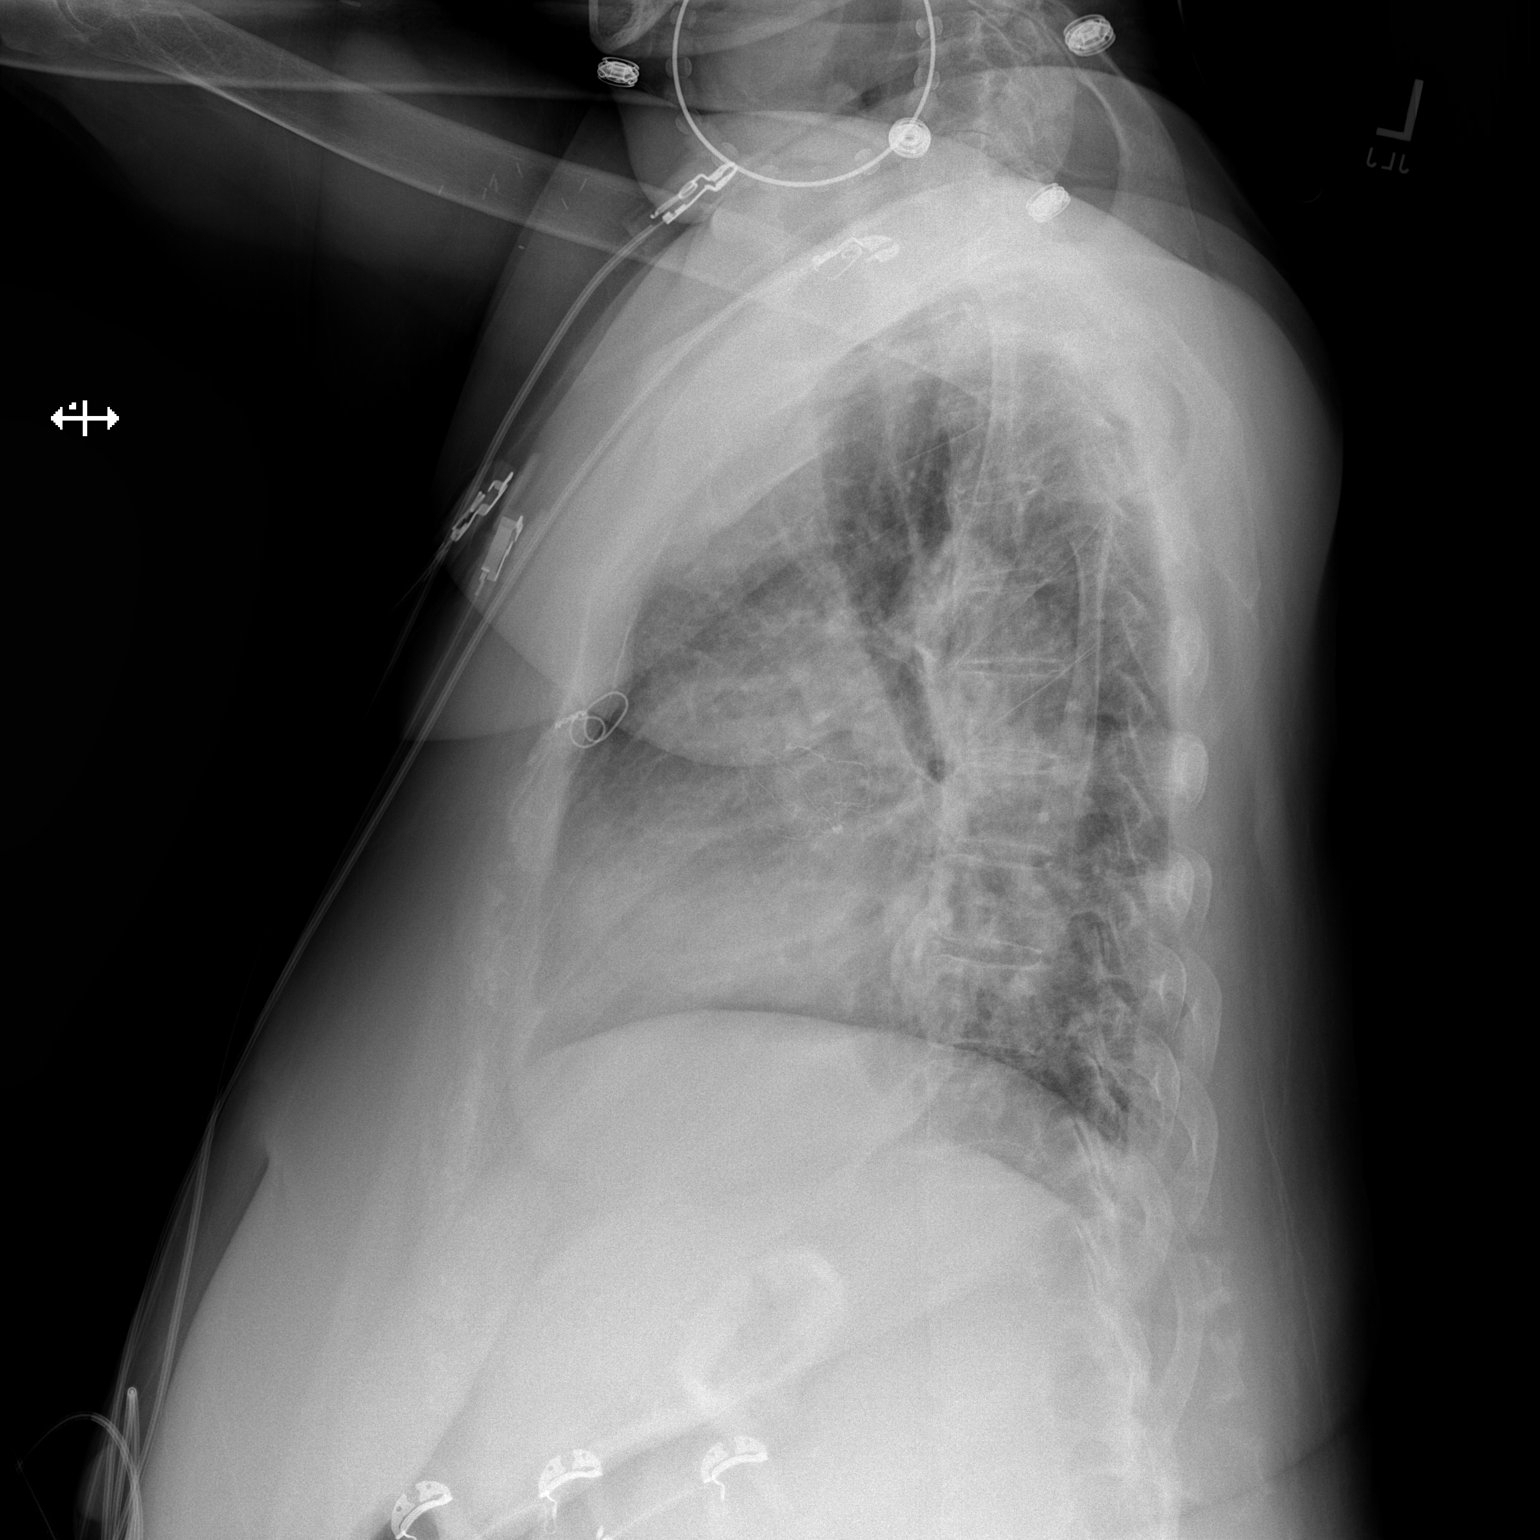

[2 of 2 positions shown; findings below may reference images not displayed]

FINDINGS: Cardiomegaly with vascular congestion. No overt edema. Patchy left
suprahilar airspace opacity concerning for early
infiltrate/pneumonia. No effusions or acute bony abnormality.
IMPRESSION: Cardiomegaly, vascular congestion.

Question left suprahilar upper lobe infiltrate/pneumonia.

## 2017-11-03 MED ORDER — SODIUM CHLORIDE 0.9 % IV BOLUS
1000.0000 mL | Freq: Once | INTRAVENOUS | Status: AC
  Administered 2017-11-03: 1000 mL via INTRAVENOUS

## 2017-11-03 MED ORDER — BUDESONIDE 0.5 MG/2ML IN SUSP
0.5000 mg | Freq: Two times a day (BID) | RESPIRATORY_TRACT | Status: DC
  Administered 2017-11-03: 0.5 mg via RESPIRATORY_TRACT
  Filled 2017-11-03: qty 2

## 2017-11-03 MED ORDER — SODIUM CHLORIDE 0.9 % IV SOLN: 1.0000 g | INTRAVENOUS | Status: DC

## 2017-11-03 MED ORDER — HYDROCORTISONE NA SUCCINATE PF 100 MG IJ SOLR: 50.0000 mg | Freq: Four times a day (QID) | INTRAMUSCULAR | Status: DC

## 2017-11-03 MED ORDER — TACROLIMUS 1 MG PO TB24: 1.0000 mg | ORAL_TABLET | Freq: Every day | ORAL | Status: DC

## 2017-11-03 MED ORDER — SODIUM CHLORIDE 0.9 % IV SOLN
500.0000 mg | Freq: Once | INTRAVENOUS | Status: AC
  Administered 2017-11-03: 500 mg via INTRAVENOUS
  Filled 2017-11-03: qty 500

## 2017-11-03 MED ORDER — SODIUM CHLORIDE 0.9 % IV SOLN
INTRAVENOUS | Status: DC
  Administered 2017-11-03: 22:00:00 via INTRAVENOUS

## 2017-11-03 MED ORDER — CARVEDILOL 12.5 MG PO TABS: 25.0000 mg | ORAL_TABLET | Freq: Two times a day (BID) | ORAL | Status: DC

## 2017-11-03 MED ORDER — ACETAMINOPHEN 325 MG PO TABS: 650.0000 mg | ORAL_TABLET | ORAL | Status: DC | PRN

## 2017-11-03 MED ORDER — SODIUM CHLORIDE 0.9 % IV SOLN: 250.0000 mL | INTRAVENOUS | Status: DC | PRN

## 2017-11-03 MED ORDER — ONDANSETRON HCL 4 MG/2ML IJ SOLN
4.0000 mg | Freq: Once | INTRAMUSCULAR | Status: AC
  Administered 2017-11-03: 4 mg via INTRAVENOUS
  Filled 2017-11-03: qty 2

## 2017-11-03 MED ORDER — SODIUM CHLORIDE 0.9 % IV SOLN: 500.0000 mg | INTRAVENOUS | Status: DC

## 2017-11-03 MED ORDER — VANCOMYCIN HCL IN DEXTROSE 750-5 MG/150ML-% IV SOLN: 750.0000 mg | INTRAVENOUS | Status: DC

## 2017-11-03 MED ORDER — FAMOTIDINE IN NACL 20-0.9 MG/50ML-% IV SOLN: 20.0000 mg | Freq: Two times a day (BID) | INTRAVENOUS | Status: DC

## 2017-11-03 MED ORDER — VANCOMYCIN HCL 10 G IV SOLR
2500.0000 mg | Freq: Once | INTRAVENOUS | Status: AC
  Administered 2017-11-03: 2500 mg via INTRAVENOUS
  Filled 2017-11-03: qty 2000

## 2017-11-03 MED ORDER — ARFORMOTEROL TARTRATE 15 MCG/2ML IN NEBU: 15.0000 ug | INHALATION_SOLUTION | Freq: Two times a day (BID) | RESPIRATORY_TRACT | Status: DC

## 2017-11-03 MED ORDER — SODIUM CHLORIDE 0.9 % IV BOLUS
250.0000 mL | Freq: Once | INTRAVENOUS | Status: AC
  Administered 2017-11-03: 250 mL via INTRAVENOUS

## 2017-11-03 MED ORDER — IPRATROPIUM-ALBUTEROL 0.5-2.5 (3) MG/3ML IN SOLN
3.0000 mL | Freq: Four times a day (QID) | RESPIRATORY_TRACT | Status: DC
  Administered 2017-11-03: 3 mL via RESPIRATORY_TRACT
  Filled 2017-11-03: qty 3

## 2017-11-03 MED ORDER — SODIUM CHLORIDE 0.9 % IV SOLN
1.0000 g | Freq: Once | INTRAVENOUS | Status: AC
  Administered 2017-11-03: 1 g via INTRAVENOUS
  Filled 2017-11-03: qty 10

## 2017-11-03 MED ORDER — LACTATED RINGERS IV BOLUS (SEPSIS)
2000.0000 mL | Freq: Once | INTRAVENOUS | Status: AC
  Administered 2017-11-03: 2000 mL via INTRAVENOUS

## 2017-11-03 MED ORDER — SERTRALINE HCL 50 MG PO TABS: 50.0000 mg | ORAL_TABLET | Freq: Every day | ORAL | Status: DC

## 2017-11-03 MED ORDER — VANCOMYCIN HCL IN DEXTROSE 1-5 GM/200ML-% IV SOLN: 1000.0000 mg | Freq: Once | INTRAVENOUS | Status: DC

## 2017-11-03 MED ORDER — ALBUTEROL SULFATE (2.5 MG/3ML) 0.083% IN NEBU: 2.5000 mg | INHALATION_SOLUTION | Freq: Four times a day (QID) | RESPIRATORY_TRACT | Status: DC | PRN

## 2017-11-03 MED ORDER — ONDANSETRON HCL 4 MG/2ML IJ SOLN: 4.0000 mg | Freq: Four times a day (QID) | INTRAMUSCULAR | Status: DC | PRN

## 2017-11-03 MED ORDER — SODIUM CHLORIDE 0.9 % IV SOLN: 2.0000 g | Freq: Once | INTRAVENOUS | Status: DC

## 2017-11-03 MED ORDER — NOREPINEPHRINE BITARTRATE 1 MG/ML IV SOLN
5.0000 ug/min | INTRAVENOUS | Status: DC
  Filled 2017-11-03: qty 4

## 2017-11-03 MED ORDER — INSULIN ASPART 100 UNIT/ML ~~LOC~~ SOLN: 1.0000 [IU] | SUBCUTANEOUS | Status: DC

## 2017-11-03 MED ORDER — MYCOPHENOLATE SODIUM 180 MG PO TBEC
180.0000 mg | DELAYED_RELEASE_TABLET | Freq: Two times a day (BID) | ORAL | Status: DC
  Filled 2017-11-03: qty 1

## 2017-11-03 MED ORDER — BENZONATATE 100 MG PO CAPS: 100.0000 mg | ORAL_CAPSULE | Freq: Three times a day (TID) | ORAL | Status: DC

## 2017-11-03 MED ORDER — DOCUSATE SODIUM 100 MG PO CAPS: 100.0000 mg | ORAL_CAPSULE | Freq: Every day | ORAL | Status: DC | PRN

## 2017-11-03 MED ORDER — FLUTICASONE FUROATE-VILANTEROL 200-25 MCG/INH IN AEPB: 1.0000 | INHALATION_SPRAY | Freq: Every day | RESPIRATORY_TRACT | Status: DC

## 2017-11-03 NOTE — ED Triage Notes (Addendum)
Pt states weak and lack of appetite for approx 3 weeks. Pt states she has been drinking water. Pt states when she eats, she has diarrhea. Pt discharged from hospital approx 2 weeks ago for cardioversion and watchmen device placed

## 2017-11-03 NOTE — ED Provider Notes (Signed)
[place] DEPT Provider Note   CSN: [id number] Arrival date & time: 11/03/17  1617     History   Chief Complaint Chief Complaint  Patient presents with  . Diarrhea    HPI [name] is a 72 y.o. female history of atrial flutter on Xarelto, depression, cardiomegaly here presenting with shortness of breath, weakness, poor appetite, diarrhea.  Patient states that for the last 3 weeks, she has been having poor appetite and nausea and loose stools.  She also had progressive weakness especially with exertion.  She states that she gets tired when she walks and apparently now she is weak to the point that she is barely able to sit up today. Denies fever or vomiting.  She has renal transplant and is currently on tacrolimus and is not on dialysis right now.   The history is provided by the patient.    Past Medical History:  Diagnosis Date  . Anemia   . Arthritis    HIP  . Asthma    as a child  . Atrial flutter [facility])    s/p aflutter ablation at [facility]  . Blood transfusion    never had a reaction to blood transfusion  . Cardiomyopathy Mar 2013   Mild, EF 50-55% by Mar 2013 ECHO, diast dysfxn II  . Closed fracture of right distal radius 11/09/16  . Constipation    takes Miralax daily  . Depression    takes Zoloft daily  . Distal radius fracture, right   . DVT of lower extremity, bilateral ([place]) 12/21/11   "they're there now; been there for 2 wks"  . Eczema   . ESRD (end stage renal disease) on dialysis [facility]) 09/2011   07-19-2015 had Kidney transplant at [facility]  . Fractures, stress    in both feet--6 OR 7 YRS AGO--RESOLVED  . Gout    doesn't require meds   . HCAP (healthcare-associated pneumonia)    09/10/11  . Hypercalcemia    09/10/11  . Hypertension    takes Diltiazem daily   . Morbid obesity ([place])   . Nonischemic dilated cardiomyopathy ([place])   . Oligouria   . PAF (paroxysmal atrial fibrillation) (HCC)     HX OF CEREBRAL BLEED WHILE ON  COUMADIN-SO PT NOT ON ANY BLOOD THINNERS NOW  . Peripheral vascular disease ([place])   . Sleep apnea    uses CPAP nightly  . Stroke [facility]) 2009   denies residual, hemorrhagic now off coumadin  . Stroke due to intracerebral hemorrhage [facility]) 2009    Patient Active Problem List   Diagnosis Date Noted  . Chronic respiratory failure with hypoxia ([place]) 10/03/2017  . Acute respiratory failure with hypoxia ([place]) 09/04/2017  . Dyspnea 07/26/2017  . Atrial flutter ([place])   . Peripheral vascular disease ([place])   . Oligouria   . Hypertension   . Fractures, stress   . Eczema   . Distal radius fracture, right   . Depression   . Constipation   . Arthritis   . Exacerbation of asthma 04/01/2017  . Closed fracture of right distal radius November 09, 2016  . Deceased-donor kidney transplant recipient 09/13/2015  . Nonischemic dilated cardiomyopathy ([place])   . Skin ulceration ([place]) 05/09/2013  . Abscess of shoulder 05/15/2012  . Expected blood loss anemia 03/26/2012  . S/P right TH revision 03/25/2012  . Abscess of hip, right 12/24/2011  . DVT of lower extremity, bilateral ([place]) 12/21/2011  . DVT (deep venous thrombosis), left 12/08/2011  . OSA on  CPAP 12/08/2011  . End stage renal disease ([place]) 11/06/2011  . 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 Pseudomonas aeruginosa infection 10/24/2011  . ESRD (end stage renal disease) on dialysis ([place]) 09/14/2011  . Hypercalcemia   . Gout 09/10/2011  . Absence of right hip joint with antibiotic pacer 09/03/2011  . Traumatic seroma of thigh ([place]) 06/11/2011  . Clostridium difficile colitis 06/09/2011  . Anemia 06/06/2011  . ARF (acute renal failure) ([place]) 06/06/2011  . SINUSITIS- ACUTE-NOS 09/27/2010  . DEPRESSION 11/25/2008  . PAF (paroxysmal atrial fibrillation) ([place]) 11/25/2008  . RENAL FAILURE, ACUTE 11/25/2008  . CVA 09/28/2008  . 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 WEAKNESS, LEFT SIDE OF BODY 09/21/2008  . Morbid obesity  ([place]) 10/20/2007  . Stroke due to intracerebral hemorrhage ([place]) 07/17/2007  . Stroke ([place]) 07/17/2007  . Essential hypertension 07/07/2007  . Asthma 07/07/2007    Past Surgical History:  Procedure Laterality Date  . AV FISTULA PLACEMENT  09/28/2011   Procedure: ARTERIOVENOUS (AV) FISTULA CREATION;  Surgeon: Rosetta Posner, MD;  Location: [place];  Service: Vascular;  Laterality: Left;  . CARDIAC VALVE SURGERY  1960    FOR ATRIAL SEPTAL DEFECT  . CHOLECYSTECTOMY     1980's  . COLONOSCOPY    . CYSTOSCOPY     many yrs ago  . FEMUR FRACTURE SURGERY  03/2011; 09/03/2011   right; "had 2, 2 wk apart in 2012; broke it again 08/2011 & had OR"  . I&D EXTREMITY  09/15/2011   Procedure: IRRIGATION AND DEBRIDEMENT EXTREMITY w REMOVAL OF HARDWARE;  Surgeon: [name], MD;  Location: [place];  Service: Orthopedics;  Laterality: Right;  . INCISION AND DRAINAGE HIP  12/21/2011   Procedure: IRRIGATION AND DEBRIDEMENT HIP;  Surgeon: [name], MD;  Location: [place];  Service: Orthopedics;  Laterality: Right;  I&D RIGHT HIP WITH PLACEMENT ANTIBIOTIC SPACER  . INSERTION OF DIALYSIS CATHETER     Procedure: INSERTION OF DIALYSIS CATHETER;  Surgeon: Rosetta Posner, MD;  Location: [place];  Service: Vascular;  Laterality: Right;  . IRRIGATION AND DEBRIDEMENT ABSCESS  12/21/11   right hip  . JOINT REPLACEMENT    . KIDNEY TRANSPLANT  07/19/15  . LAPAROSCOPIC GASTRIC BANDING    . OPEN REDUCTION INTERNAL FIXATION (ORIF) DISTAL RADIAL FRACTURE Right 10/18/2016   Procedure: OPEN REDUCTION INTERNAL FIXATION (ORIF) RIGHT DISTAL RADIAL FRACTURE;  Surgeon: Marchia Bond, MD;  Location: [place];  Service: Orthopedics;  Laterality: Right;  . TOTAL HIP ARTHROPLASTY  02/2011   right THA 02/2011, I&D/removal of hardware 09/2011,, repeat I&D Jun 2013, reimplantation R THA 03-26-2012  . TOTAL HIP REVISION  03/25/2012   Procedure: TOTAL HIP REVISION;  Surgeon: [name], MD;  Location: [facility];  Service: Orthopedics;   Laterality: Right;  Right Total Hip Reimplantation  . VENA CAVA FILTER PLACEMENT  11/2011     OB History   None      Home Medications    Prior to Admission medications   Medication Sig Start Date End Date Taking? Authorizing Provider  albuterol (PROVENTIL HFA;VENTOLIN HFA) 108 (90 Base) MCG/ACT inhaler Inhale 2 puffs into the lungs every 6 (six) hours as needed for wheezing or shortness of breath. 06/21/17   Roma Schanz R, DO  albuterol (PROVENTIL) (2.5 MG/3ML) 0.083% nebulizer solution Take 3 mLs (2.5 mg total) by nebulization every 6 (six) hours as needed for wheezing or shortness of breath. 07/19/17   [name],  DO  aspirin EC 81 MG tablet Take 81 mg by mouth. 07/21/15   [provider]  benzonatate (TESSALON) 100 MG capsule Take 1 capsule (100 mg total) by mouth 3 (three) times daily. 08/09/17   [name], DO  budesonide-formoterol (SYMBICORT) 160-4.5 MCG/ACT inhaler Inhale 2 puffs into the lungs 2 (two) times daily. 10/02/17   Parrett, Fonnie Mu, NP  budesonide-formoterol (SYMBICORT) 160-4.5 MCG/ACT inhaler Inhale 2 puffs into the lungs 2 (two) times daily. 10/02/17   Parrett, Fonnie Mu, NP  carvedilol (COREG) 25 MG tablet Take 1 tablet (25 mg total) by mouth 2 (two) times daily. 08/07/17 08/07/18  Sueanne Margarita, MD  docusate sodium (COLACE) 100 MG capsule Take 100 mg by mouth daily as needed (For constipation.).     [provider]  fluticasone (FLOVENT HFA) 220 MCG/ACT inhaler Inhale 2 puffs into the lungs 2 (two) times daily. 06/27/17   [name], DO  furosemide (LASIX) 40 MG tablet Take 40 mg by mouth daily. Take 2 tablets by mouth daily (Total 80 mg)    [provider]  guaiFENesin (MUCINEX) 600 MG 12 hr tablet Take 1 tablet (600 mg total) by mouth 2 (two) times daily. 09/02/17   [name], DO  HYDROcodone-homatropine (HYCODAN) 5-1.5 MG/5ML syrup Take 5 mLs by mouth 2 (two) times daily. 09/04/17   [name],  MD  mycophenolate (MYFORTIC) 180 MG EC tablet Take 180 mg by mouth 2 (two) times daily.    [provider]  promethazine-dextromethorphan (PROMETHAZINE-DM) 6.25-15 MG/5ML syrup Take 5 mLs by mouth 4 (four) times daily as needed for cough. 08/08/17   [name], DO  rivaroxaban (XARELTO) 20 MG TABS tablet Take 20 mg by mouth daily with supper.    [provider]  sertraline (ZOLOFT) 50 MG tablet Take 1 tablet (50 mg total) by mouth daily. 04/19/17   [name], DO  Tacrolimus (ENVARSUS XR) 1 MG TB24 Take by mouth.    [provider]    Family History Family History  Problem Relation Age of Onset  . Cancer Father   . Diabetes Mother   . Hypertension Mother   . Hodgkin's lymphoma Unknown 32       dscd---HODGKINS DISEASE    Social History Social History   Tobacco Use  . Smoking status: Never Smoker  . Smokeless tobacco: Never Used  Substance Use Topics  . Alcohol use: No  . Drug use: No     Allergies   Ace inhibitors and Warfarin sodium   Review of Systems Review of Systems  Gastrointestinal: Positive for diarrhea.  Neurological: Positive for weakness.  All other systems reviewed and are negative.    Physical Exam Updated Vital Signs BP 100/66   Pulse 72   Temp 98 F (36.7 C) (Oral)   Resp (!) 23   Ht 5\' 5"  (1.651 m)   Wt 101.6 kg (224 lb)   SpO2 95%   BMI 37.28 kg/m   Physical Exam  Constitutional:  Dehydrated, chronically ill   HENT:  Head: Normocephalic.  MM dry   Eyes: Pupils are equal, round, and reactive to light. Conjunctivae and EOM are normal.  Neck: Normal range of motion. Neck supple.  Cardiovascular: Normal rate, regular rhythm and normal heart sounds.  Pulmonary/Chest: Effort normal and breath sounds normal. No stridor. No respiratory distress. She has no wheezes.  Abdominal: Soft. Bowel sounds are normal. She exhibits no distension. There  is no tenderness.  Musculoskeletal: Normal range of  motion. She exhibits edema.  1+ edema bilaterally   Neurological: She is alert.  Skin: Skin is warm.  Psychiatric: She has a normal mood and affect.  Nursing note and vitals reviewed.    ED Treatments / Results  Labs (all labs ordered are listed, but only abnormal results are displayed) Labs Reviewed  COMPREHENSIVE METABOLIC PANEL - Abnormal; Notable for the following components:      Result Value   Potassium 3.2 (*)    Chloride 97 (*)    Glucose, Bld 136 (*)    BUN 52 (*)    Creatinine, Ser 3.47 (*)    Total Protein 6.4 (*)    Albumin 2.6 (*)    Total Bilirubin 1.4 (*)    GFR calc non Af Amer 12 (*)    GFR calc Af Amer 14 (*)    All other components within normal limits  CBC - Abnormal; Notable for the following components:   WBC 32.7 (*)    RBC 3.48 (*)    Hemoglobin 8.7 (*)    HCT 26.8 (*)    MCV 77.0 (*)    MCH 25.0 (*)    RDW 20.3 (*)    All other components within normal limits  URINALYSIS, ROUTINE W REFLEX MICROSCOPIC - Abnormal; Notable for the following components:   Color, Urine AMBER (*)    APPearance CLOUDY (*)    Hgb urine dipstick LARGE (*)    Protein, ur 30 (*)    Leukocytes, UA MODERATE (*)    Bacteria, UA MANY (*)    Squamous Epithelial / LPF 6-30 (*)    All other components within normal limits  BRAIN NATRIURETIC PEPTIDE - Abnormal; Notable for the following components:   B Natriuretic Peptide 525.4 (*)    All other components within normal limits  URINE CULTURE  CULTURE, BLOOD (ROUTINE X 2)  CULTURE, BLOOD (ROUTINE X 2)  LIPASE, BLOOD  I-STAT TROPONIN, ED  I-STAT CG4 LACTIC ACID, ED    EKG EKG Interpretation  Date/Time:  Sunday November 03 2017 18:18:29 EDT Ventricular Rate:  93 PR Interval:    QRS Duration: 126 QT Interval:  397 QTC Calculation: 494 R Axis:   -52 Text Interpretation:  Atrial fibrillation Nonspecific IVCD with LAD LVH with secondary repolarization abnormality Anterior Q waves, possibly due to LVH No significant change  since last tracing Confirmed by Wandra Arthurs [phone]) on 11/03/2017 6:47:37 PM   Radiology Dg Chest 2 View  Result Date: 11/03/2017 CLINICAL DATA:  Shortness of breath, weakness, loss of appetite EXAM: CHEST - 2 VIEW COMPARISON:  10/02/2017 FINDINGS: Cardiomegaly with vascular congestion. No overt edema. Patchy left suprahilar airspace opacity concerning for early infiltrate/pneumonia. No effusions or acute bony abnormality. IMPRESSION: Cardiomegaly, vascular congestion. Question left suprahilar upper lobe infiltrate/pneumonia. Electronically Signed   By: Rolm Baptise M.D.   On: 11/03/2017 19:07    Procedures Procedures (including critical care time)  Angiocath insertion Performed by: Wandra Arthurs  Consent: Verbal consent obtained. Risks and benefits: risks, benefits and alternatives were discussed Time out: Immediately prior to procedure a "time out" was called to verify the correct patient, procedure, equipment, support staff and site/side marked as required.  Preparation: Patient was prepped and draped in the usual sterile fashion.  Vein Location: R forearm  Ultrasound Guided  Gauge: 20 long  Normal blood return and flush without difficulty Patient tolerance: Patient tolerated the procedure well with no immediate complications.  Medications Ordered in ED Medications  sodium chloride 0.9 % bolus 1,000 mL (1,000 mLs Intravenous New Bag/Given 11/03/17 1930)  cefTRIAXone (ROCEPHIN) 1 g in sodium chloride 0.9 % 100 mL IVPB (1 g Intravenous New Bag/Given 11/03/17 1930)  azithromycin (ZITHROMAX) 500 mg in sodium chloride 0.9 % 250 mL IVPB (has no administration in time range)  ondansetron (ZOFRAN) injection 4 mg (4 mg Intravenous Given 11/03/17 1930)     Initial Impression / Assessment and Plan / ED Course  I have reviewed the triage vital signs and the nursing notes.  Pertinent labs & imaging results that were available during my care of the patient were reviewed by me and  considered in my medical decision making (see chart for details).    DEOSHA WERDEN is a 72 y.o. female here with nausea, diarrhea, weakness. She is on tacrolimus for renal transplant. Consider infections such as UTI or pneumonia or renal failure or electrolyte abnormalities. Will get labs, UA, CXR. Will hydrate and reassess.   7:53 PM Cr. 3.5, was 0.98 at [facility] a month ago. UA + UTI. Since she has renal transplant and has AKI and UTI, ordered rocephin and IVF. Also has pneumonia on CXR and became hypoxic to 85% on RA now. Will add azithromycin as well. Weakness likely multifactorial- dehydration, AKI, pneumonia, UTI. Will admit.   Final Clinical Impressions(s) / ED Diagnoses   Final diagnoses:  None    ED Discharge Orders    None       Jane Freeze, MD 11/03/17 [place]

## 2017-11-03 NOTE — H&P (Signed)
TRH H&P   Patient Demographics:    [name], is a 72 y.o. female  MRN: [id number]   DOB - 06-18-1946  Admit Date - 11/03/2017  Outpatient Primary MD for the patient is Carollee Herter, Alferd Apa, DO  Referring MD/NP/PA:   Shirlyn Goltz  Outpatient Specialists:     Patient coming from: home  Chief Complaint  Patient presents with  . Diarrhea      HPI:    [name]  is a 72 y.o. female, w hypertension, Pafib/ Aflutter, CVA, ESRD, s/p renal transplantation, Anemia, presents with c/o diarrhea for the past week. Slight cough.  Pt denies fever, chills, cp, palp, sob, n/v, abd pain, constipation, brbpr, black stool, dysuria, hematuria.  Pt presented to ED due to general malaise.   In Ed,  CXR IMPRESSION: Cardiomegaly, vascular congestion.  Question left suprahilar upper lobe infiltrate/pneumonia.  Na 138, K 3.2, Bun 52, creatinine 3.47 Ast 17, Alt 20 Alb 2.6  Wbc 32.7, hgb 8.7, Plt 259 BNP 525.4 Trop 0.08 Urinalysis wbc TNTC, rbc TNTC  Blood culture x2 pending  Pt will be admitted for UTI, ARF, diarrhea, ? Pneumonia.     Review of systems:    In addition to the HPI above, No Fever-chills, No Headache, No changes with Vision or hearing, No problems swallowing food or Liquids, No Chest pain, No Shortness of Breath, No Abdominal pain, No Nausea or Vommitting, No Blood in stool or Urine, No dysuria, No new skin rashes or bruises, No new joints pains-aches,  No new weakness, tingling, numbness in any extremity, No recent weight gain or loss, No polyuria, polydypsia or polyphagia, No significant Mental Stressors.  A full 10 point Review of Systems was done, except as stated above, all other Review of Systems were negative.   With Past History of the following :    Past Medical History:  Diagnosis Date  . Anemia   . Arthritis    HIP  . Asthma    as a  child  . Atrial flutter [facility])    s/p aflutter ablation at [facility]  . Blood transfusion    never had a reaction to blood transfusion  . Cardiomyopathy Mar 2013   Mild, EF 50-55% by Mar 2013 ECHO, diast dysfxn II  . Closed fracture of right distal radius 10/18/2016  . Constipation    takes Miralax daily  . Depression    takes Zoloft daily  . Distal radius fracture, right   . DVT of lower extremity, bilateral ([place]) 12/21/11   "they're there now; been there for 2 wks"  . Eczema   . ESRD (end stage renal disease) on dialysis [facility]) 09/2011   07-19-2015 had Kidney transplant at [facility]  . Fractures, stress    in both feet--6 OR 7 YRS AGO--RESOLVED  . Gout    doesn't require meds   . HCAP (healthcare-associated pneumonia)  09/10/11  . Hypercalcemia    09/10/11  . Hypertension    takes Diltiazem daily   . Morbid obesity ([place])   . Nonischemic dilated cardiomyopathy ([place])   . Oligouria   . PAF (paroxysmal atrial fibrillation) (HCC)     HX OF CEREBRAL BLEED WHILE ON COUMADIN-SO PT NOT ON ANY BLOOD THINNERS NOW  . Peripheral vascular disease ([place])   . Sleep apnea    uses CPAP nightly  . Stroke [facility]) 2009   denies residual, hemorrhagic now off coumadin  . Stroke due to intracerebral hemorrhage ([place]) 2009      Past Surgical History:  Procedure Laterality Date  . AV FISTULA PLACEMENT  09/28/2011   Procedure: ARTERIOVENOUS (AV) FISTULA CREATION;  Surgeon: Rosetta Posner, MD;  Location: [place];  Service: Vascular;  Laterality: Left;  . CARDIAC VALVE SURGERY  1960    FOR ATRIAL SEPTAL DEFECT  . CHOLECYSTECTOMY     1980's  . COLONOSCOPY    . CYSTOSCOPY     many yrs ago  . FEMUR FRACTURE SURGERY  03/2011; 09/03/2011   right; "had 2, 2 wk apart in 2012; broke it again 08/2011 & had OR"  . I&D EXTREMITY  09/15/2011   Procedure: IRRIGATION AND DEBRIDEMENT EXTREMITY w REMOVAL OF HARDWARE;  Surgeon: [name], MD;  Location: [place];  Service: Orthopedics;  Laterality: Right;  . INCISION AND DRAINAGE  HIP  12/21/2011   Procedure: IRRIGATION AND DEBRIDEMENT HIP;  Surgeon: [name], MD;  Location: [place];  Service: Orthopedics;  Laterality: Right;  I&D RIGHT HIP WITH PLACEMENT ANTIBIOTIC SPACER  . INSERTION OF DIALYSIS CATHETER     Procedure: INSERTION OF DIALYSIS CATHETER;  Surgeon: Rosetta Posner, MD;  Location: [place];  Service: Vascular;  Laterality: Right;  . IRRIGATION AND DEBRIDEMENT ABSCESS  12/21/11   right hip  . JOINT REPLACEMENT    . KIDNEY TRANSPLANT  07/19/15  . LAPAROSCOPIC GASTRIC BANDING    . OPEN REDUCTION INTERNAL FIXATION (ORIF) DISTAL RADIAL FRACTURE Right 10/18/2016   Procedure: OPEN REDUCTION INTERNAL FIXATION (ORIF) RIGHT DISTAL RADIAL FRACTURE;  Surgeon: Marchia Bond, MD;  Location: [place];  Service: Orthopedics;  Laterality: Right;  . TOTAL HIP ARTHROPLASTY  02/2011   right THA 02/2011, I&D/removal of hardware 09/2011,, repeat I&D Jun 2013, reimplantation R THA 03-26-2012  . TOTAL HIP REVISION  03/25/2012   Procedure: TOTAL HIP REVISION;  Surgeon: [name], MD;  Location: [facility];  Service: Orthopedics;  Laterality: Right;  Right Total Hip Reimplantation  . VENA CAVA FILTER PLACEMENT  11/2011      Social History:     Social History   Tobacco Use  . Smoking status: Never Smoker  . Smokeless tobacco: Never Used  Substance Use Topics  . Alcohol use: No     Lives - at home  Mobility - walks by self   Family History :     Family History  Problem Relation Age of Onset  . Cancer Father   . Diabetes Mother   . Hypertension Mother   . Hodgkin's lymphoma Unknown 32       dscd---HODGKINS DISEASE      Home Medications:   Prior to Admission medications   Medication Sig Start Date End Date Taking? Authorizing Provider  albuterol (PROVENTIL HFA;VENTOLIN HFA) 108 (90 Base) MCG/ACT inhaler Inhale 2 puffs into the lungs every 6 (six) hours as needed for wheezing or shortness of breath. 06/21/17   Carollee Herter,  Yvonne R, DO  albuterol  (PROVENTIL) (2.5 MG/3ML) 0.083% nebulizer solution Take 3 mLs (2.5 mg total) by nebulization every 6 (six) hours as needed for wheezing or shortness of breath. 07/19/17   [name], DO  aspirin EC 81 MG tablet Take 81 mg by mouth. 07/21/15   [provider]  benzonatate (TESSALON) 100 MG capsule Take 1 capsule (100 mg total) by mouth 3 (three) times daily. 08/09/17   [name], DO  budesonide-formoterol (SYMBICORT) 160-4.5 MCG/ACT inhaler Inhale 2 puffs into the lungs 2 (two) times daily. 10/02/17   Parrett, Fonnie Mu, NP  budesonide-formoterol (SYMBICORT) 160-4.5 MCG/ACT inhaler Inhale 2 puffs into the lungs 2 (two) times daily. 10/02/17   Parrett, Fonnie Mu, NP  carvedilol (COREG) 25 MG tablet Take 1 tablet (25 mg total) by mouth 2 (two) times daily. 08/07/17 08/07/18  Sueanne Margarita, MD  docusate sodium (COLACE) 100 MG capsule Take 100 mg by mouth daily as needed (For constipation.).     [provider]  fluticasone (FLOVENT HFA) 220 MCG/ACT inhaler Inhale 2 puffs into the lungs 2 (two) times daily. 06/27/17   [name], DO  furosemide (LASIX) 40 MG tablet Take 40 mg by mouth daily. Take 2 tablets by mouth daily (Total 80 mg)    [provider]  guaiFENesin (MUCINEX) 600 MG 12 hr tablet Take 1 tablet (600 mg total) by mouth 2 (two) times daily. 09/02/17   [name], DO  HYDROcodone-homatropine (HYCODAN) 5-1.5 MG/5ML syrup Take 5 mLs by mouth 2 (two) times daily. 09/04/17   [name], MD  mycophenolate (MYFORTIC) 180 MG EC tablet Take 180 mg by mouth 2 (two) times daily.    [provider]  promethazine-dextromethorphan (PROMETHAZINE-DM) 6.25-15 MG/5ML syrup Take 5 mLs by mouth 4 (four) times daily as needed for cough. 08/08/17   [name], DO  rivaroxaban (XARELTO) 20 MG TABS tablet Take 20 mg by mouth daily with supper.    [provider]  sertraline (ZOLOFT) 50 MG tablet Take 1 tablet (50 mg total) by  mouth daily. 04/19/17   [name], DO  Tacrolimus (ENVARSUS XR) 1 MG TB24 Take by mouth.    [provider]     Allergies:     Allergies  Allergen Reactions  . Ace Inhibitors Other (See Comments)    Reaction unknown  . Warfarin Sodium Other (See Comments)    Caused her to have a stroke     Physical Exam:   Vitals  Blood pressure 100/66, pulse 72, temperature 98 F (36.7 C), temperature source Oral, resp. rate (!) 23, height 5\' 5"  (1.651 m), weight 101.6 kg (224 lb), SpO2 95 %.   1. General  lying in bed in NAD,    2. Normal affect and insight, Not Suicidal or Homicidal, Awake Alert, Oriented X 3.  3. No F.N deficits, ALL C.Nerves Intact, Strength 5/5 all 4 extremities, Sensation intact all 4 extremities, Plantars down going.  4. Ears and Eyes appear Normal, Conjunctivae clear, PERRLA. Moist Oral Mucosa.  5. Supple Neck, No JVD, No cervical lymphadenopathy appriciated, No Carotid Bruits.  6. Symmetrical Chest wall movement, Good air movement bilaterally, CTAB.  7. RRR, No Gallops, Rubs or Murmurs, No Parasternal Heave.  8. Positive Bowel Sounds, Abdomen Soft, No tenderness, No organomegaly appriciated,No rebound -guarding or rigidity.  9.  No Cyanosis, Normal Skin Turgor, No Skin Rash or Bruise.  10. Good muscle  tone,  joints appear normal , no effusions, Normal ROM.  11. No Palpable Lymph Nodes in Neck or Axillae  No CVA tenderness   Data Review:    CBC Recent Labs  Lab 11/03/17 1805  WBC 32.7*  HGB 8.7*  HCT 26.8*  PLT 259  MCV 77.0*  MCH 25.0*  MCHC 32.5  RDW 20.3*   ------------------------------------------------------------------------------------------------------------------  Chemistries  Recent Labs  Lab 11/03/17 1805  NA 138  K 3.2*  CL 97*  CO2 28  GLUCOSE 136*  BUN 52*  CREATININE 3.47*  CALCIUM 9.0  AST 17  ALT 20  ALKPHOS 81  BILITOT 1.4*    ------------------------------------------------------------------------------------------------------------------ estimated creatinine clearance is 18.1 mL/min (A) (by C-G formula based on SCr of 3.47 mg/dL (H)). ------------------------------------------------------------------------------------------------------------------ No results for input(s): TSH, T4TOTAL, T3FREE, THYROIDAB in the last 72 hours.  Invalid input(s): FREET3  Coagulation profile No results for input(s): INR, PROTIME in the last 168 hours. ------------------------------------------------------------------------------------------------------------------- No results for input(s): DDIMER in the last 72 hours. -------------------------------------------------------------------------------------------------------------------  Cardiac Enzymes No results for input(s): CKMB, TROPONINI, MYOGLOBIN in the last 168 hours.  Invalid input(s): CK ------------------------------------------------------------------------------------------------------------------    Component Value Date/Time   BNP 525.4 (H) 11/03/2017 1805     ---------------------------------------------------------------------------------------------------------------  Urinalysis    Component Value Date/Time   COLORURINE AMBER (A) 11/03/2017 1805   APPEARANCEUR CLOUDY (A) 11/03/2017 1805   LABSPEC 1.014 11/03/2017 1805   PHURINE 5.0 11/03/2017 1805   GLUCOSEU NEGATIVE 11/03/2017 1805   HGBUR LARGE (A) 11/03/2017 1805   HGBUR negative 10/04/2009 1044   BILIRUBINUR NEGATIVE 11/03/2017 1805   KETONESUR NEGATIVE 11/03/2017 1805   PROTEINUR 30 (A) 11/03/2017 1805   UROBILINOGEN 0.2 03/25/2012 1354   NITRITE NEGATIVE 11/03/2017 1805   LEUKOCYTESUR MODERATE (A) 11/03/2017 1805    ----------------------------------------------------------------------------------------------------------------   Imaging Results:    Dg Chest 2 View  Result Date:  11/03/2017 CLINICAL DATA:  Shortness of breath, weakness, loss of appetite EXAM: CHEST - 2 VIEW COMPARISON:  10/02/2017 FINDINGS: Cardiomegaly with vascular congestion. No overt edema. Patchy left suprahilar airspace opacity concerning for early infiltrate/pneumonia. No effusions or acute bony abnormality. IMPRESSION: Cardiomegaly, vascular congestion. Question left suprahilar upper lobe infiltrate/pneumonia. Electronically Signed   By: Rolm Baptise M.D.   On: 11/03/2017 19:07     Assessment & Plan:    Principal Problem:   ARF (acute renal failure) (HCC) Active Problems:   Leukocytosis   Pneumonia   UTI (urinary tract infection)   Diarrhea    ARF Check urine sodium, urine creatinine, urine eosinophils Check renal ultrasound HOLD Lasix Check cmp in am Please consult nephrology in AM  UTI Blood culture pending Urine culture pending Rocephin 1gm iv qday  ? Pneumonia , cap Blood culture x2 Sputum gram stain , culture if able Urine strep antigen Urine legionella antigen Rocephin 1gm iv qday, Zithromax 500mg  iv qday  Diarrhea Stool for C. Diff Stool for GI Pathogen panel  Tachycardia Trop I q6h x3 Check cardiac echo Will have to be gentle on the hydration.   Pafib, DVT HOLD xarelto due to ARF Consider heparin iv temporarily til renal function improves in AM  CM (EF 25%) Cont carvedilol Be careful in terms of hydration  Asthma Cont Symbicort=> Breo  H/o Renal transplant Cont Mycophenolate Cont Tacrolimus   DVT Prophylaxis    SCDs  AM Labs Ordered, also please review Full Orders  Family Communication: Admission, patients condition and plan of care including tests being ordered have been discussed with the patient  who indicate understanding and agree with the plan and Code Status.  Code Status FULL CODE  Likely DC to  home  Condition GUARDED   Consults called:  Please call nephrology in am  Admission status: inpatient  Time spent in minutes :  45   Jani Gravel M.D on 11/03/2017 at 8:33 PM  Between 7am to 7pm - Pager - [phone]  After 7pm go to [email or web address] - password [facility]  [facility] - Office  [phone]

## 2017-11-03 NOTE — Progress Notes (Signed)
Hypotension ? Secondary to UTI, pneumonia Trop I q6h x3 Check cardiac echo Ns 250 mL iv x1 PCCM consult requested, d/w Dr. Emmit Alexanders They will try to consult asap Appreciate input

## 2017-11-03 NOTE — H&P (Signed)
PULMONARY / CRITICAL CARE MEDICINE   Name: [name] MRN: [id number] DOB: 23-Jan-1946    ADMISSION DATE:  11/03/2017 CONSULTATION DATE: 11/03/17  REFERRING MD:  ER MD  CHIEF COMPLAINT: Septic Shock, AKI  HISTORY OF PRESENT ILLNESS:   71yoF with hx ESRD s/p Renal transplant (07/2015 at [facility]), CHF (EF 25-30%), Afib (on xarelto), DVT, Asthma, ICH (2009), OSA on CPAP, PVD, Morbid obesity, HTN, Gout, who presented today to the [facility] ER c/o decreased PO intake / poor appetite x 3 weeks with small volume loose stool (only after eating) x 3 weeks. She denies that the stool is watery or bloody or dark. She also reports her urine is dark and smelly for the past 3 days with occasional dysuria. Also admits to cough in February which she was told was a viral illness but now resolved. Also admits to DOE x 3 weeks. Denies F/C, N/V, or Abdominal pain. She reports compliance with her Mycophenolate and tacrolimus with no recent dose changes.   In the ER she was found to have septic shock due to UTI with AKI, raising concern for acute rejection. She received 1250cc NS bolus in the ER. BP improved from 78/57 to 91/56. Second liter of IVF is transfusing currently. Patient has only 1 PIV (20G); RN is attempting to place second PIV now as well as Foley catheter.    PAST MEDICAL HISTORY :  She  has a past medical history of Anemia, Arthritis, Asthma, Atrial flutter ([place]), Blood transfusion, Cardiomyopathy (Mar 2013), Closed fracture of right distal radius (10/18/2016), Constipation, Depression, Distal radius fracture, right, DVT of lower extremity, bilateral ([place]) (12/21/11), Eczema, ESRD (end stage renal disease) on dialysis ([place]) (09/2011), Fractures, stress, Gout, HCAP (healthcare-associated pneumonia), Hypercalcemia, Hypertension, Morbid obesity ([place]), Nonischemic dilated cardiomyopathy ([place]), Oligouria, PAF (paroxysmal atrial fibrillation) ([place]), Peripheral vascular disease ([place]), Sleep apnea, Stroke ([place])  (2009), and Stroke due to intracerebral hemorrhage ([place]) (2009).  PAST SURGICAL HISTORY: She  has a past surgical history that includes Cardiac valuve replacement (1960); I&D extremity (09/15/2011); AV fistula placement (09/28/2011); Cystoscopy; Colonoscopy; Irrigation and debridement abscess (12/21/11); Vena cava filter placement (11/2011); Total hip arthroplasty (02/2011); Femur fracture surgery (03/2011; 09/03/2011); Insertion of dialysis catheter; Cholecystectomy; Incision and drainage hip (12/21/2011); Total hip revision (03/25/2012); Kidney transplant (07/19/15); Joint replacement; Laparoscopic gastric banding; and Open reduction internal fixation (orif) distal radial fracture (Right, 10/18/2016).  Allergies  Allergen Reactions  . Ace Inhibitors Other (See Comments)    Reaction unknown  . Warfarin Sodium Other (See Comments)    Caused her to have a stroke    No current facility-administered medications on file prior to encounter.    Current Outpatient Medications on File Prior to Encounter  Medication Sig  . acetaminophen (TYLENOL) 500 MG tablet Take 1,000 mg by mouth every 6 (six) hours as needed for moderate pain.  Marland Kitchen albuterol (PROVENTIL HFA;VENTOLIN HFA) 108 (90 Base) MCG/ACT inhaler Inhale 2 puffs into the lungs every 6 (six) hours as needed for wheezing or shortness of breath.  Marland Kitchen amiodarone (PACERONE) 200 MG tablet Take 200 mg by mouth daily.  Marland Kitchen aspirin EC 81 MG tablet Take 81 mg by mouth.  . budesonide-formoterol (SYMBICORT) 160-4.5 MCG/ACT inhaler Inhale 2 puffs into the lungs 2 (two) times daily.  . carvedilol (COREG) 25 MG tablet Take 1 tablet (25 mg total) by mouth 2 (two) times daily.  . furosemide (LASIX) 40 MG tablet Take 40 mg by mouth 2 (two) times daily.   . mycophenolate (MYFORTIC) 180  MG EC tablet Take 180 mg by mouth 2 (two) times daily.  . predniSONE (DELTASONE) 5 MG tablet Take 5 mg by mouth daily with breakfast.  . rivaroxaban (XARELTO) 20 MG TABS tablet Take 20 mg by mouth  daily with supper.  . sertraline (ZOLOFT) 50 MG tablet Take 1 tablet (50 mg total) by mouth daily.  . Tacrolimus (ENVARSUS XR) 1 MG TB24 Take 1 tablet by mouth daily.   Marland Kitchen albuterol (PROVENTIL) (2.5 MG/3ML) 0.083% nebulizer solution Take 3 mLs (2.5 mg total) by nebulization every 6 (six) hours as needed for wheezing or shortness of breath.  . docusate sodium (COLACE) 100 MG capsule Take 100 mg by mouth daily as needed (For constipation.).    FAMILY HISTORY:  Her indicated that her mother is deceased. She indicated that her father is deceased. She indicated that her maternal grandmother is deceased. She indicated that her maternal grandfather is deceased. She indicated that her paternal grandmother is deceased. She indicated that her paternal grandfather is deceased. She indicated that the status of her unknown relative is unknown.  SOCIAL HISTORY: She  reports that she has never smoked. She has never used smokeless tobacco. She reports that she does not drink alcohol or use drugs.  REVIEW OF SYSTEMS:   Review of Systems  Constitutional: Positive for malaise/fatigue. Negative for chills and fever.  HENT: Negative.   Eyes: Negative.   Respiratory: Positive for shortness of breath. Negative for cough and wheezing.   Cardiovascular: Negative.  Negative for chest pain.  Gastrointestinal: Positive for diarrhea. Negative for abdominal pain, blood in stool, heartburn, melena, nausea and vomiting.  Genitourinary: Positive for dysuria and urgency. Negative for flank pain, frequency and hematuria.  Musculoskeletal: Negative.   Skin: Negative.   Neurological: Negative.   Endo/Heme/Allergies: Negative.   Psychiatric/Behavioral: Negative.    SUBJECTIVE:  Lying on ICU bed, Septic shock, Critically ill  VITAL SIGNS: BP (!) 76/59 (BP Location: Right Wrist)   Pulse (!) 105   Temp 97.6 F (36.4 C) (Oral)   Resp (!) 23   Ht 5\' 6"  (1.676 m)   Wt 104.7 kg (230 lb 13.2 oz)   SpO2 99%   BMI 37.26 kg/m    HEMODYNAMICS:  BP 91/56 on no vasopressors   INTAKE / OUTPUT: No intake/output data recorded.  PHYSICAL EXAMINATION: General: WDWN Morbidly obese female, Awake/alert, critically ill due to septic shock Neuro: AAOx3, moving all extremities, PERRL HEENT: OP clear, MM moist  Cardiovascular: tachycardic with HR 100's with a regular rhythm Lungs: CTA b/l, speaking in full sentences with no accessory muscle use. Pox 99% on 2L O2. No respiratory distress.  Abdomen: Obese, Soft, NTND, BS hypoactive Musculoskeletal: 1+ BLE edema Skin: no rashes   LABS:  BMET Recent Labs  Lab 11/03/17 1805  NA 138  K 3.2*  CL 97*  CO2 28  BUN 52*  CREATININE 3.47*  GLUCOSE 136*   Electrolytes Recent Labs  Lab 11/03/17 1805  CALCIUM 9.0   CBC Recent Labs  Lab 11/03/17 1805  WBC 32.7*  HGB 8.7*  HCT 26.8*  PLT 259   Coag's Recent Labs  Lab 11/03/17 2202  APTT 35  INR 1.90   Sepsis Markers Recent Labs  Lab 11/03/17 2052 11/03/17 2202  LATICACIDVEN 1.28  --   PROCALCITON  --  31.11   ABG No results for input(s): PHART, PCO2ART, PO2ART in the last 168 hours.  Liver Enzymes Recent Labs  Lab 11/03/17 1805  AST 17  ALT 20  ALKPHOS 81  BILITOT 1.4*  ALBUMIN 2.6*   Cardiac Enzymes Recent Labs  Lab 11/03/17 2118  TROPONINI 0.09*   Glucose No results for input(s): GLUCAP in the last 168 hours.  Imaging Dg Chest 2 View  Result Date: 11/03/2017 CLINICAL DATA:  Shortness of breath, weakness, loss of appetite EXAM: CHEST - 2 VIEW COMPARISON:  10/02/2017 FINDINGS: Cardiomegaly with vascular congestion. No overt edema. Patchy left suprahilar airspace opacity concerning for early infiltrate/pneumonia. No effusions or acute bony abnormality. IMPRESSION: Cardiomegaly, vascular congestion. Question left suprahilar upper lobe infiltrate/pneumonia. Electronically Signed   By: Rolm Baptise M.D.   On: 11/03/2017 19:07   CULTURES: Blood cultures (4/21): pending UA: consistent  with UTI Urine culture (4/21) pending  ANTIBIOTICS: Ceftriaxone 4/21 Azithromycin 4/21 Vancomycin 4/22 >> Cefepime 4/22>>  SIGNIFICANT EVENTS: 4/21 PM: presented to ER with Decreased PO intake, Diarrhea and found to have Septic shock due to UTI and AKI 4/22 early AM: transferring to [facility]   LINES/TUBES: 1-20G PIV; (2nd IV being attempted now) Foley catheter (being placed now)  DISCUSSION: 71yoF with hx ESRD s/p Renal transplant (07/2015 at [facility]), CHF (EF 25-30%), Afib (on xarelto), DVT, Asthma, ICH (2009), OSA on CPAP, PVD, Morbid obesity, HTN, Gout, admitted with AKI and Septic shock due to UTI.    ASSESSMENT / PLAN:  PULMONARY 1. Acute Hypoxic Respiratory Failure; Pulmonary Infiltrate (favor edema, less likely pneumonia) - currently on 2L O2 with Pox 98%; could likely wean off O2 - CXR shows very faint LUL infiltrate that appears more consistent with pulmonary edema than with pneumonia; clinically patient denies cough or fever so again feel pneumonia unlikely - monitor respiratory status closely as we give IVF's for her shock. Wean O2 as tolerated  2. OSA:  - CPAP qHS and PRN  3. Asthma - no wheezing on exam; start pulmicort and brovana BID and Duonebs q6hrs  CARDIOVASCULAR 1. Afib; CHF - continue Xarelto for her Afib. HR 100's likely will improve with IVF's. Hold her home dose beta blocker given the shock - history of EF 25-30% although appears clinically dry; will need to be careful with fluid repletion not to overload her  2. Hx HTN; currently in Shock - hold home BP medicines given the shock.   RENAL 1. AKI; History of Renal transplant: - creatinine 3.47 up from baseline of 0.87 (on 4/4) - renal ultrasound ordered (but was not performed prior to transfer to [facility]) - place foley catheter - hold tacrolimus and cellcept; check tacrolimus level.  - consulted Renal (Dr Jonnie Finner) who recommended starting hydrocortisone 50mg  IV q6hrs instead - Given her renal  transplant status, there is concern for acute rejection. Will transfer to [facility] for [facility] care since that is where she received her transplant. Gave signout to Dr Sabra Heck (CCM accepting attending) at [place] [facility]. Diarrhea: - check Cdiff PCR; enteric precautions until rule out. NPO; GI prophylaxis  HEMATOLOGIC 1. Hx DVT: on long term xarelto for her Afib  INFECTIOUS 1. Septic Shock due to UTI: - s/p 1250cc IVF bolus in ER; BP improved from 78/57 to 91/56. Her 30cc/kg bolus is 3.5L which I dont think she will tolerate with her CHF. Will give an additional 1L LR now to run in slowly, then re-eval respiratory status and need for further IVF's at that time.  - UA consistent with UTI; Blood cultures pending.  - Not on vasopressors at this time although has soft BP.  - Received Ceftriaxone and Azithromycin in ER; has history  of Pseudomonas (pan-sensitive) from a joint culture in the past. And has history of MRSA nares positive. Therefore will change antibiotics to Vancomycin and Cefepime.  - procalcitonin elevated at 31.1; recheck daily. Lactate 1.28; continue to trend. Cortisol level pending.  - diarrhea x 3wks but the description (small volume, intermittent, only after eating, loose not watery) does not sound like Cdiff; check Cdiff PCR but no empiric treatment.   ENDOCRINE No active issues; NPO; SSI PRN  NEUROLOGIC No active issues    FAMILY  - Updates: updated patient and her husband at ICU bedside  - Inter-disciplinary family meet or Palliative Care meeting due by: 11/10/17   60 minutes critical care time  Vernie Murders, MD  Pulmonary and [place] Pager: [phone]  11/03/2017, 11:52 PM

## 2017-11-03 NOTE — ED Notes (Signed)
Md made aware of change in vitals.

## 2017-11-03 NOTE — Progress Notes (Signed)
ED TO INPATIENT HANDOFF REPORT  Name/Age/Gender [name] 71 y.o. female  Code Status Code Status History    Date Active Date Inactive Code Status Order ID Comments User Context   03/25/2012 2351 03/31/2012 1739 Full Code [id number]  Jablonski, Cathy D, RN Inpatient   12/21/2011 1805 12/25/2011 1439 Full Code [id number]  Corum, Cary Rudder, RN Inpatient   12/08/2011 0647 12/09/2011 1947 Full Code [id number]  Columbres, Sonia Sison, RN Inpatient   09/21/2011 1743 10/02/2011 1646 Full Code [id number]  Samtani, Jai-Gurmukh, MD Inpatient   09/03/2011 1602 09/06/2011 1820 Full Code [id number]  Clark, Lancie E, RN Inpatient   06/06/2011 2100 06/11/2011 2006 Full Code [id number]  Miller, Tina C, RN Inpatient    Advance Directive Documentation     Most Recent Value  Type of Advance Directive  Healthcare Power of Attorney  Pre-existing out of facility DNR order (yellow form or pink MOST form)  -  "MOST" Form in Place?  -      Home/SNF/Other Home  Chief Complaint Diahrrea/ Lack of Appetite/ Dehyadration/ Generalized Weakness   Level of Care/Admitting Diagnosis ED Disposition    ED Disposition Condition Comment   Admit  Hospital Area: [facility] [[id number]]  Level of Care: Stepdown [14]  Admit to SDU based on following criteria: Hemodynamic compromise or significant risk of instability:  Patient requiring short term acute titration and management of vasoactive drips, and invasive monitoring (i.e., CVP and Arterial line).  Diagnosis: ARF (acute renal failure) (HCC) [238129]  Admitting Physician: KIM, JAMES [[id number]]  Attending Physician: KIM, JAMES [[id number]]  Estimated length of stay: past midnight tomorrow  Certification:: I certify this patient will need inpatient services for at least 2 midnights  PT Class (Do Not Modify): Inpatient [101]  PT Acc Code (Do Not Modify): Private [1]       Medical History Past Medical History:  Diagnosis Date  . Anemia   . Arthritis    HIP  . Asthma     as a child  . Atrial flutter (HCC)    s/p aflutter ablation at [facility]  . Blood transfusion    never had a reaction to blood transfusion  . Cardiomyopathy Mar 2013   Mild, EF 50-55% by Mar 2013 ECHO, diast dysfxn II  . Closed fracture of right distal radius 10/18/2016  . Constipation    takes Miralax daily  . Depression    takes Zoloft daily  . Distal radius fracture, right   . DVT of lower extremity, bilateral (HCC) 12/21/11   "they're there now; been there for 2 wks"  . Eczema   . ESRD (end stage renal disease) on dialysis (HCC) 09/2011   07-19-2015 had Kidney transplant at [facility]  . Fractures, stress    in both feet--6 OR 7 YRS AGO--RESOLVED  . Gout    doesn't require meds   . HCAP (healthcare-associated pneumonia)    09/10/11  . Hypercalcemia    09/10/11  . Hypertension    takes Diltiazem daily   . Morbid obesity (HCC)   . Nonischemic dilated cardiomyopathy (HCC)   . Oligouria   . PAF (paroxysmal atrial fibrillation) (HCC)     HX OF CEREBRAL BLEED WHILE ON COUMADIN-SO PT NOT ON ANY BLOOD THINNERS NOW  . Peripheral vascular disease (HCC)   . Sleep apnea    uses CPAP nightly  . Stroke (HCC) 2009   denies residual, hemorrhagic now off coumadin  . Stroke due to intracerebral hemorrhage (HCC) 2009      Allergies Allergies  Allergen Reactions  . Ace Inhibitors Other (See Comments)    Reaction unknown  . Warfarin Sodium Other (See Comments)    Caused her to have a stroke    IV Location/Drains/Wounds Patient Lines/Drains/Airways Status   Active Line/Drains/Airways    Name:   Placement date:   Placement time:   Site:   Days:   PICC / Midline Single Lumen PICC Right   -    -    -      CVC Single Lumen 05/28/11 Right Other (Comment)   05/28/11    -    Other (Comment)   2351   Vascular Access Right Internal jugular Hemodialysis catheter   09/28/11    0815    Internal jugular   2228   Vascular Access Left Arteriovenous fistula   09/28/11    0914    -   2228   Vascular Access  Left Upper arm Arteriovenous fistula   -    -    Upper arm      Vascular Access Left Upper arm Arteriovenous fistula   -    -    Upper arm      Closed System Drain 2 Right Hip Accordion (Hemovac) 10 Fr.   09/03/11    1025    Hip   2253   Incision 09/03/11 Hip Right   09/03/11    0920     2253   Incision 09/11/11 Hip Right   09/11/11    0005     2245   Incision 09/15/11 Hip Right   09/15/11    1156     2241   Incision 09/28/11 Neck Right   09/28/11    0937     2228   Incision 09/28/11 Hand Left   09/28/11    0937     2228   Incision 11/12/11 Arm Left   11/12/11    1257     2183   Incision 12/21/11 Hip Right   12/21/11    1550     2144   Incision 03/25/12 Leg Right   03/25/12    2150     2049   Incision (Closed) 10/18/16 Hand Right   10/18/16    0813     381   Wound 06/06/11 Other (Comment) Groin Bilateral dime sized open lesioins to groin folds, labial folds and upper thighs   06/06/11    2100    Groin   2342   Wound 06/06/11 Other (Comment) Hip Right 15x11x9.5cm right hip wound   06/06/11    -    Hip   2342   Wound Other (Comment) Abdomen Other (Comment) open yeasty wounds   -    -    Abdomen             Labs/Imaging Results for orders placed or performed during the hospital encounter of 11/03/17 (from the past 48 hour(s))  Lipase, blood     Status: None   Collection Time: 11/03/17  6:05 PM  Result Value Ref Range   Lipase 30 11 - 51 U/L    Comment: Performed at [facility], [place] [address] [place], [place] [address]  Comprehensive metabolic panel     Status: Abnormal   Collection Time: 11/03/17  6:05 PM  Result Value Ref Range   Sodium 138 135 - 145 mmol/L   Potassium 3.2 (L) 3.5 - 5.1 mmol/L   Chloride 97 (L) 101 - 111  mmol/L   CO2 28 22 - 32 mmol/L   Glucose, Bld 136 (H) 65 - 99 mg/dL   BUN 52 (H) 6 - 20 mg/dL   Creatinine, Ser 3.47 (H) 0.44 - 1.00 mg/dL   Calcium 9.0 8.9 - 10.3 mg/dL   Total Protein 6.4 (L) 6.5 - 8.1 g/dL   Albumin 2.6 (L) 3.5 - 5.0 g/dL    AST 17 15 - 41 U/L   ALT 20 14 - 54 U/L   Alkaline Phosphatase 81 38 - 126 U/L   Total Bilirubin 1.4 (H) 0.3 - 1.2 mg/dL   GFR calc non Af Amer 12 (L) >60 mL/min   GFR calc Af Amer 14 (L) >60 mL/min    Comment: (NOTE) The eGFR has been calculated using the CKD EPI equation. This calculation has not been validated in all clinical situations. eGFR's persistently <60 mL/min signify possible Chronic Kidney Disease.    Anion gap 13 5 - 15    Comment: Performed at [facility], [address] [place], [place] [address]  CBC     Status: Abnormal   Collection Time: 11/03/17  6:05 PM  Result Value Ref Range   WBC 32.7 (H) 4.0 - 10.5 K/uL   RBC 3.48 (L) 3.87 - 5.11 MIL/uL   Hemoglobin 8.7 (L) 12.0 - 15.0 g/dL   HCT 26.8 (L) 36.0 - 46.0 %   MCV 77.0 (L) 78.0 - 100.0 fL   MCH 25.0 (L) 26.0 - 34.0 pg   MCHC 32.5 30.0 - 36.0 g/dL   RDW 20.3 (H) 11.5 - 15.5 %   Platelets 259 150 - 400 K/uL    Comment: Performed at [facility], [address] [place], [place] [address]  Urinalysis, Routine w reflex microscopic     Status: Abnormal   Collection Time: 11/03/17  6:05 PM  Result Value Ref Range   Color, Urine AMBER (A) YELLOW    Comment: BIOCHEMICALS MAY BE AFFECTED BY COLOR   APPearance CLOUDY (A) CLEAR   Specific Gravity, Urine 1.014 1.005 - 1.030   pH 5.0 5.0 - 8.0   Glucose, UA NEGATIVE NEGATIVE mg/dL   Hgb urine dipstick LARGE (A) NEGATIVE   Bilirubin Urine NEGATIVE NEGATIVE   Ketones, ur NEGATIVE NEGATIVE mg/dL   Protein, ur 30 (A) NEGATIVE mg/dL   Nitrite NEGATIVE NEGATIVE   Leukocytes, UA MODERATE (A) NEGATIVE   RBC / HPF TOO NUMEROUS TO COUNT 0 - 5 RBC/hpf   WBC, UA TOO NUMEROUS TO COUNT 0 - 5 WBC/hpf   Bacteria, UA MANY (A) NONE SEEN   Squamous Epithelial / LPF 6-30 (A) NONE SEEN   WBC Clumps PRESENT    Mucus PRESENT    Hyaline Casts, UA PRESENT     Comment: Performed at [facility], [address]  [place], [place] [address]  Brain natriuretic peptide     Status: Abnormal   Collection Time: 11/03/17  6:05 PM  Result Value Ref Range   B Natriuretic Peptide 525.4 (H) 0.0 - 100.0 pg/mL    Comment: Performed at  [facility], [address] [place], [place] [address]  I-stat troponin, ED     Status: None   Collection Time: 11/03/17  6:09 PM  Result Value Ref Range   Troponin i, poc 0.08 0.00 - 0.08 ng/mL   Comment 3            Comment: Due to the release kinetics of cTnI, a negative result   within the first hours of the onset of symptoms does not rule out myocardial infarction with certainty. If myocardial infarction is still suspected, repeat the test at appropriate intervals.   I-Stat CG4 Lactic Acid, ED     Status: None   Collection Time: 11/03/17  8:52 PM  Result Value Ref Range   Lactic Acid, Venous 1.28 0.5 - 1.9 mmol/L  Protime-INR     Status: Abnormal   Collection Time: 11/03/17 10:02 PM  Result Value Ref Range   Prothrombin Time 21.7 (H) 11.4 - 15.2 seconds   INR 1.90     Comment: Performed at [facility], [place] [address], [place], [place] [address]  APTT     Status: None   Collection Time: 11/03/17 10:02 PM  Result Value Ref Range   aPTT 35 24 - 36 seconds    Comment: Performed at [facility], [place] [address], [place], [place] [address]   Dg Chest 2 View  Result Date: 11/03/2017 CLINICAL DATA:  Shortness of breath, weakness, loss of appetite EXAM: CHEST - 2 VIEW COMPARISON:  10/02/2017 FINDINGS: Cardiomegaly with vascular congestion. No overt edema. Patchy left suprahilar airspace opacity concerning for early infiltrate/pneumonia. No effusions or acute bony abnormality. IMPRESSION: Cardiomegaly, vascular congestion. Question left suprahilar upper lobe infiltrate/pneumonia. Electronically Signed   By: Rolm Baptise M.D.   On: 11/03/2017 19:07    Pending Labs Unresulted Labs (From admission, onward)   Start      Ordered   11/04/17 0500  Procalcitonin  Daily,   R     11/03/17 2203   11/04/17 0000  Lactic acid, plasma  STAT Now then every 3 hours,   R     11/03/17 2203   11/03/17 2203  MRSA PCR Screening  STAT,   R     11/03/17 2203   11/03/17 2203  Culture, expectorated sputum-assessment  STAT,   R     11/03/17 2203   11/03/17 2203  Procalcitonin - Baseline  STAT,   STAT     11/03/17 2203   11/03/17 2203  Cortisol  STAT,   R     11/03/17 2203   11/03/17 2202  Tacrolimus level  STAT,   R     11/03/17 2202   11/03/17 2118  Troponin I (q 6hr x 3)  Now then every 6 hours,   R     11/03/17 2117   11/03/17 2108  C difficile quick scan w PCR reflex  (C Difficile quick screen w PCR reflex panel)  Once, for 24 hours,   R     11/03/17 2107   11/03/17 2108  Gastrointestinal Panel by PCR , Stool  (Gastrointestinal Panel by PCR, Stool)  Once,   R     11/03/17 2107   11/03/17 1940  Blood culture (routine x 2)  BLOOD CULTURE X 2,   STAT     11/03/17 1939   11/03/17 1756  Urine culture  STAT,   STAT     11/03/17 1755   Signed and Held  Sodium, urine, random  Once,   R     Signed and Held   Signed and Held  Calcium / creatinine ratio, urine  Once,   R     Signed and Held   Signed and Held  Culture, blood (routine x 2) Call MD if unable to obtain prior to antibiotics being given  BLOOD CULTURE X 2,   R    Comments:  If  blood cultures drawn in Emergency Department - Do not draw and cancel order   Question:  Patient immune status  Answer:  Normal   Signed and Held   Signed and Held  Culture, sputum-assessment  Once,   R    Question:  Patient immune status  Answer:  Normal   Signed and Held   Signed and Held  Gram stain  Once,   R    Question:  Patient immune status  Answer:  Normal   Signed and Held   Signed and Held  HIV antibody (Routine Screening)  Once,   R     Signed and Held   Signed and Held  Strep pneumoniae urinary antigen  Once,   R     Signed and Held   Signed and Held  Legionella  Pneumophila Serogp 1 Ur Ag  Once,   R     Signed and Held   Signed and Held  Tacrolimus level  Add-on,   R     Signed and Held   Signed and Held  Mycophenolic Acid (CellCept)  Add-on,   R     Signed and Held      Vitals/Pain Today's Vitals   11/03/17 2140 11/03/17 2141 11/03/17 2200 11/03/17 2217  BP:   99/68   Pulse: (!) 102 83 91   Resp: (!) 23 (!) 22 19   Temp:      TempSrc:      SpO2: 100% 100% 100% 100%  Weight:      Height:      PainSc:        Isolation Precautions Enteric precautions (UV disinfection)  Medications Medications  sodium chloride 0.9 % bolus 250 mL (250 mLs Intravenous New Bag/Given 11/03/17 2202)  0.9 %  sodium chloride infusion ( Intravenous New Bag/Given 11/03/17 2202)  ipratropium-albuterol (DUONEB) 0.5-2.5 (3) MG/3ML nebulizer solution 3 mL (3 mLs Nebulization Given 11/03/17 2216)  budesonide (PULMICORT) nebulizer solution 0.5 mg (0.5 mg Nebulization Given 11/03/17 2216)  arformoterol (BROVANA) nebulizer solution 15 mcg (has no administration in time range)  lactated ringers bolus 2,000 mL (has no administration in time range)  ceFEPIme (MAXIPIME) 2 g in sodium chloride 0.9 % 100 mL IVPB (has no administration in time range)  norepinephrine (LEVOPHED) 4 mg in dextrose 5 % 250 mL (0.016 mg/mL) infusion (has no administration in time range)  vancomycin (VANCOCIN) 2,500 mg in sodium chloride 0.9 % 500 mL IVPB (2,500 mg Intravenous New Bag/Given 11/03/17 2251)  sodium chloride 0.9 % bolus 1,000 mL (0 mLs Intravenous Stopped 11/03/17 2001)  ondansetron (ZOFRAN) injection 4 mg (4 mg Intravenous Given 11/03/17 1930)  cefTRIAXone (ROCEPHIN) 1 g in sodium chloride 0.9 % 100 mL IVPB (0 g Intravenous Stopped 11/03/17 2000)  azithromycin (ZITHROMAX) 500 mg in sodium chloride 0.9 % 250 mL IVPB (0 mg Intravenous Stopped 11/03/17 2152)    Mobility walks  

## 2017-11-03 NOTE — ED Notes (Signed)
Attempted IV access x3 w/o success.

## 2017-11-03 NOTE — Progress Notes (Signed)
Pharmacy Antibiotic Note  [name] is a 72 y.o. female admitted on 11/03/2017 with pneumonia.  Pharmacy has been consulted for cefepime and vancomycin dosing.  Plan: Cefepime 2 Gm x1 then 1 Gm IV q24h Vancomycin 2500 mg x1 then 750 mg IV q48h for est AUC = 487 Goal AUC = 400-500 F/u scr/cultures/levels  Height: 5\' 5"  (165.1 cm) Weight: 224 lb (101.6 kg) IBW/kg (Calculated) : 57  Temp (24hrs), Avg:98 F (36.7 C), Min:98 F (36.7 C), Max:98 F (36.7 C)  Recent Labs  Lab 11/03/17 1805 11/03/17 2052  WBC 32.7*  --   CREATININE 3.47*  --   LATICACIDVEN  --  1.28    Estimated Creatinine Clearance: 18.1 mL/min (A) (by C-G formula based on SCr of 3.47 mg/dL (H)).    Allergies  Allergen Reactions  . Ace Inhibitors Other (See Comments)    Reaction unknown  . Warfarin Sodium Other (See Comments)    Caused her to have a stroke    Antimicrobials this admission: 4/21 rocephin and zmax >> x1 ED 4/21 cefepime >>  4/21 vancomycin >>  Dose adjustments this admission:   Microbiology results:  BCx:   UCx:    Sputum:    MRSA PCR:  Thank you for allowing pharmacy to be a part of this patient's care.  Dorrene German 11/03/2017 10:14 PM

## 2017-11-04 ENCOUNTER — Ambulatory Visit: Payer: Medicare Other | Admitting: Family Medicine

## 2017-11-04 ENCOUNTER — Inpatient Hospital Stay (HOSPITAL_COMMUNITY): Payer: Medicare Other

## 2017-11-04 DIAGNOSIS — Z8673 Personal history of transient ischemic attack (TIA), and cerebral infarction without residual deficits: Secondary | ICD-10-CM | POA: Diagnosis not present

## 2017-11-04 DIAGNOSIS — R6520 Severe sepsis without septic shock: Secondary | ICD-10-CM | POA: Diagnosis not present

## 2017-11-04 DIAGNOSIS — Z9181 History of falling: Secondary | ICD-10-CM | POA: Diagnosis not present

## 2017-11-04 DIAGNOSIS — I5020 Unspecified systolic (congestive) heart failure: Secondary | ICD-10-CM | POA: Diagnosis not present

## 2017-11-04 DIAGNOSIS — Z8614 Personal history of Methicillin resistant Staphylococcus aureus infection: Secondary | ICD-10-CM | POA: Diagnosis not present

## 2017-11-04 DIAGNOSIS — I110 Hypertensive heart disease with heart failure: Secondary | ICD-10-CM | POA: Diagnosis not present

## 2017-11-04 DIAGNOSIS — M6281 Muscle weakness (generalized): Secondary | ICD-10-CM | POA: Diagnosis not present

## 2017-11-04 DIAGNOSIS — N179 Acute kidney failure, unspecified: Secondary | ICD-10-CM

## 2017-11-04 DIAGNOSIS — Z8701 Personal history of pneumonia (recurrent): Secondary | ICD-10-CM | POA: Diagnosis not present

## 2017-11-04 DIAGNOSIS — Z86711 Personal history of pulmonary embolism: Secondary | ICD-10-CM | POA: Diagnosis not present

## 2017-11-04 DIAGNOSIS — Z86718 Personal history of other venous thrombosis and embolism: Secondary | ICD-10-CM | POA: Diagnosis not present

## 2017-11-04 DIAGNOSIS — N3001 Acute cystitis with hematuria: Secondary | ICD-10-CM

## 2017-11-04 DIAGNOSIS — R6521 Severe sepsis with septic shock: Secondary | ICD-10-CM

## 2017-11-04 DIAGNOSIS — J189 Pneumonia, unspecified organism: Secondary | ICD-10-CM | POA: Diagnosis present

## 2017-11-04 DIAGNOSIS — Z79899 Other long term (current) drug therapy: Secondary | ICD-10-CM | POA: Diagnosis not present

## 2017-11-04 DIAGNOSIS — Z95818 Presence of other cardiac implants and grafts: Secondary | ICD-10-CM | POA: Diagnosis not present

## 2017-11-04 DIAGNOSIS — I2510 Atherosclerotic heart disease of native coronary artery without angina pectoris: Secondary | ICD-10-CM | POA: Diagnosis present

## 2017-11-04 DIAGNOSIS — I132 Hypertensive heart and chronic kidney disease with heart failure and with stage 5 chronic kidney disease, or end stage renal disease: Secondary | ICD-10-CM | POA: Diagnosis not present

## 2017-11-04 DIAGNOSIS — I481 Persistent atrial fibrillation: Secondary | ICD-10-CM | POA: Diagnosis not present

## 2017-11-04 DIAGNOSIS — I120 Hypertensive chronic kidney disease with stage 5 chronic kidney disease or end stage renal disease: Secondary | ICD-10-CM | POA: Diagnosis not present

## 2017-11-04 DIAGNOSIS — R262 Difficulty in walking, not elsewhere classified: Secondary | ICD-10-CM | POA: Diagnosis not present

## 2017-11-04 DIAGNOSIS — G4733 Obstructive sleep apnea (adult) (pediatric): Secondary | ICD-10-CM | POA: Diagnosis not present

## 2017-11-04 DIAGNOSIS — T8619 Other complication of kidney transplant: Secondary | ICD-10-CM | POA: Diagnosis present

## 2017-11-04 DIAGNOSIS — Z7982 Long term (current) use of aspirin: Secondary | ICD-10-CM | POA: Diagnosis not present

## 2017-11-04 DIAGNOSIS — Z940 Kidney transplant status: Secondary | ICD-10-CM | POA: Diagnosis not present

## 2017-11-04 DIAGNOSIS — Z95828 Presence of other vascular implants and grafts: Secondary | ICD-10-CM | POA: Diagnosis not present

## 2017-11-04 DIAGNOSIS — I5041 Acute combined systolic (congestive) and diastolic (congestive) heart failure: Secondary | ICD-10-CM | POA: Diagnosis not present

## 2017-11-04 DIAGNOSIS — Z7951 Long term (current) use of inhaled steroids: Secondary | ICD-10-CM | POA: Diagnosis not present

## 2017-11-04 DIAGNOSIS — Z992 Dependence on renal dialysis: Secondary | ICD-10-CM | POA: Diagnosis not present

## 2017-11-04 DIAGNOSIS — A419 Sepsis, unspecified organism: Secondary | ICD-10-CM

## 2017-11-04 DIAGNOSIS — Z96641 Presence of right artificial hip joint: Secondary | ICD-10-CM | POA: Diagnosis present

## 2017-11-04 DIAGNOSIS — N3000 Acute cystitis without hematuria: Secondary | ICD-10-CM | POA: Diagnosis not present

## 2017-11-04 DIAGNOSIS — Z9981 Dependence on supplemental oxygen: Secondary | ICD-10-CM | POA: Diagnosis not present

## 2017-11-04 DIAGNOSIS — N186 End stage renal disease: Secondary | ICD-10-CM | POA: Diagnosis not present

## 2017-11-04 DIAGNOSIS — N390 Urinary tract infection, site not specified: Secondary | ICD-10-CM | POA: Diagnosis not present

## 2017-11-04 DIAGNOSIS — Z7901 Long term (current) use of anticoagulants: Secondary | ICD-10-CM | POA: Diagnosis not present

## 2017-11-04 DIAGNOSIS — I10 Essential (primary) hypertension: Secondary | ICD-10-CM | POA: Diagnosis not present

## 2017-11-04 DIAGNOSIS — R488 Other symbolic dysfunctions: Secondary | ICD-10-CM | POA: Diagnosis not present

## 2017-11-04 DIAGNOSIS — I4891 Unspecified atrial fibrillation: Secondary | ICD-10-CM | POA: Diagnosis not present

## 2017-11-04 DIAGNOSIS — I4892 Unspecified atrial flutter: Secondary | ICD-10-CM | POA: Diagnosis not present

## 2017-11-04 DIAGNOSIS — I5031 Acute diastolic (congestive) heart failure: Secondary | ICD-10-CM | POA: Diagnosis not present

## 2017-11-04 DIAGNOSIS — R197 Diarrhea, unspecified: Secondary | ICD-10-CM

## 2017-11-04 DIAGNOSIS — Z792 Long term (current) use of antibiotics: Secondary | ICD-10-CM | POA: Diagnosis not present

## 2017-11-04 DIAGNOSIS — I959 Hypotension, unspecified: Secondary | ICD-10-CM | POA: Diagnosis not present

## 2017-11-04 DIAGNOSIS — E8770 Fluid overload, unspecified: Secondary | ICD-10-CM | POA: Diagnosis not present

## 2017-11-04 DIAGNOSIS — I5043 Acute on chronic combined systolic (congestive) and diastolic (congestive) heart failure: Secondary | ICD-10-CM | POA: Diagnosis not present

## 2017-11-04 HISTORY — DX: Acute kidney failure, unspecified: N17.9

## 2017-11-04 LAB — HIV ANTIBODY (ROUTINE TESTING W REFLEX): HIV Screen 4th Generation wRfx: NONREACTIVE

## 2017-11-04 LAB — CORTISOL: Cortisol, Plasma: 7.9 ug/dL

## 2017-11-04 LAB — STREP PNEUMONIAE URINARY ANTIGEN: Strep Pneumo Urinary Antigen: NEGATIVE

## 2017-11-04 LAB — LACTIC ACID, PLASMA: Lactic Acid, Venous: 1.3 mmol/L (ref 0.5–1.9)

## 2017-11-04 LAB — SODIUM, URINE, RANDOM: Sodium, Ur: 16 mmol/L

## 2017-11-04 MED ORDER — IPRATROPIUM-ALBUTEROL 0.5-2.5 (3) MG/3ML IN SOLN: 3.0000 mL | Freq: Four times a day (QID) | RESPIRATORY_TRACT | 0 refills | Status: DC

## 2017-11-04 MED ORDER — ARFORMOTEROL TARTRATE 15 MCG/2ML IN NEBU: 15.0000 ug | INHALATION_SOLUTION | Freq: Two times a day (BID) | RESPIRATORY_TRACT | Status: DC

## 2017-11-04 MED ORDER — RIVAROXABAN 20 MG PO TABS: 20.0000 mg | ORAL_TABLET | Freq: Every day | ORAL | Status: DC

## 2017-11-04 MED ORDER — FAMOTIDINE IN NACL 20-0.9 MG/50ML-% IV SOLN: 20.0000 mg | Freq: Two times a day (BID) | INTRAVENOUS | Status: DC

## 2017-11-04 MED ORDER — ACETAMINOPHEN 325 MG PO TABS: 650.0000 mg | ORAL_TABLET | ORAL | Status: DC | PRN

## 2017-11-04 MED ORDER — SODIUM CHLORIDE 0.9 % IV SOLN: 1.0000 g | INTRAVENOUS | Status: DC

## 2017-11-04 MED ORDER — NOREPINEPHRINE BITARTRATE 1 MG/ML IV SOLN: 5.0000 ug/min | INTRAVENOUS | Status: DC

## 2017-11-04 MED ORDER — INSULIN ASPART 100 UNIT/ML ~~LOC~~ SOLN: 1.0000 [IU] | SUBCUTANEOUS | 11 refills | Status: DC

## 2017-11-04 MED ORDER — HYDROCORTISONE NA SUCCINATE PF 100 MG IJ SOLR: 50.0000 mg | Freq: Four times a day (QID) | INTRAMUSCULAR | Status: DC

## 2017-11-04 MED ORDER — ONDANSETRON HCL 4 MG/2ML IJ SOLN: 4.0000 mg | Freq: Four times a day (QID) | INTRAMUSCULAR | 0 refills | Status: DC | PRN

## 2017-11-04 MED ORDER — BUDESONIDE 0.5 MG/2ML IN SUSP: 0.5000 mg | Freq: Two times a day (BID) | RESPIRATORY_TRACT | 12 refills | Status: DC

## 2017-11-04 MED ORDER — SODIUM CHLORIDE 0.9 % IV SOLN: 2.0000 g | Freq: Once | INTRAVENOUS | Status: AC

## 2017-11-04 MED ORDER — VANCOMYCIN HCL IN DEXTROSE 750-5 MG/150ML-% IV SOLN: 750.0000 mg | INTRAVENOUS | Status: DC

## 2017-11-04 NOTE — Telephone Encounter (Signed)
Patient is in hospital

## 2017-11-04 NOTE — Discharge Summary (Signed)
PULMONARY / CRITICAL CARE MEDICINE   Name: ANADELIA KINTZ MRN:   [id number] DOB:   [birth year]         ADMISSION DATE:  11/03/2017 DISCHARGE DATE: 11/03/2017  REFERRING MD:  ER MD  CHIEF COMPLAINT: Septic Shock, AKI  HISTORY OF PRESENT ILLNESS:   71yoF with hx ESRD s/p Renal transplant (07/2015 at [facility]), CHF (EF 25-30%), Afib (on xarelto), DVT, Asthma, ICH (2009), OSA on CPAP, PVD, Morbid obesity, HTN, Gout, who presented today to the [facility] ER c/o decreased PO intake / poor appetite x 3 weeks with small volume loose stool (only after eating) x 3 weeks. She denies that the stool is watery or bloody or dark. She also reports her urine is dark and smelly for the past 3 days with occasional dysuria. Also admits to cough in February which she was told was a viral illness but now resolved. Also admits to DOE x 3 weeks. Denies F/C, N/V, or Abdominal pain. She reports compliance with her Mycophenolate and tacrolimus with no recent dose changes.   HOSPITAL COURSE: In the ER she was found to have septic shock due to UTI with AKI, raising concern for acute rejection. She received 1250cc NS bolus in the ER. BP improved from 78/57 to 91/56. Second liter of IVF is transfusing currently. Patient has only 1 PIV (20G); RN is attempting to place second PIV now as well as Foley catheter. Admitted to ICU. [facility] where Dr Sabra Heck [facility]) attempted patient in transfer out of concern for possible acute renal rejection.    PAST MEDICAL HISTORY :  She  has a past medical history of Anemia, Arthritis, Asthma, Atrial flutter ([place]), Blood transfusion, Cardiomyopathy (Mar 2013), Closed fracture of right distal radius (10/18/2016), Constipation, Depression, Distal radius fracture, right, DVT of lower extremity, bilateral ([place]) (12/21/11), Eczema, ESRD (end stage renal disease) on dialysis ([place]) (09/2011), Fractures, stress, Gout, HCAP (healthcare-associated pneumonia), Hypercalcemia, Hypertension, Morbid  obesity ([place]), Nonischemic dilated cardiomyopathy ([place]), Oligouria, PAF (paroxysmal atrial fibrillation) ([place]), Peripheral vascular disease ([place]), Sleep apnea, Stroke ([place]) (2009), and Stroke due to intracerebral hemorrhage ([place]) (2009).  PAST SURGICAL HISTORY: She  has a past surgical history that includes Cardiac valuve replacement (1960); I&D extremity (09/15/2011); AV fistula placement (09/28/2011); Cystoscopy; Colonoscopy; Irrigation and debridement abscess (12/21/11); Vena cava filter placement (11/2011); Total hip arthroplasty (02/2011); Femur fracture surgery (03/2011; 09/03/2011); Insertion of dialysis catheter; Cholecystectomy; Incision and drainage hip (12/21/2011); Total hip revision (03/25/2012); Kidney transplant (07/19/15); Joint replacement; Laparoscopic gastric banding; and Open reduction internal fixation (orif) distal radial fracture (Right, 10/18/2016).       Allergies  Allergen Reactions  . Ace Inhibitors Other (See Comments)    Reaction unknown  . Warfarin Sodium Other (See Comments)    Caused her to have a stroke    No current facility-administered medications on file prior to encounter.        Current Outpatient Medications on File Prior to Encounter  Medication Sig  . acetaminophen (TYLENOL) 500 MG tablet Take 1,000 mg by mouth every 6 (six) hours as needed for moderate pain.  Marland Kitchen albuterol (PROVENTIL HFA;VENTOLIN HFA) 108 (90 Base) MCG/ACT inhaler Inhale 2 puffs into the lungs every 6 (six) hours as needed for wheezing or shortness of breath.  Marland Kitchen amiodarone (PACERONE) 200 MG tablet Take 200 mg by mouth daily.  Marland Kitchen aspirin EC 81 MG tablet Take 81 mg by mouth.  . budesonide-formoterol (SYMBICORT) 160-4.5 MCG/ACT inhaler Inhale 2 puffs into the lungs 2 (two) times daily.  Marland Kitchen  carvedilol (COREG) 25 MG tablet Take 1 tablet (25 mg total) by mouth 2 (two) times daily.  . furosemide (LASIX) 40 MG tablet Take 40 mg by mouth 2 (two) times daily.   . mycophenolate (MYFORTIC) 180 MG EC  tablet Take 180 mg by mouth 2 (two) times daily.  . predniSONE (DELTASONE) 5 MG tablet Take 5 mg by mouth daily with breakfast.  . rivaroxaban (XARELTO) 20 MG TABS tablet Take 20 mg by mouth daily with supper.  . sertraline (ZOLOFT) 50 MG tablet Take 1 tablet (50 mg total) by mouth daily.  . Tacrolimus (ENVARSUS XR) 1 MG TB24 Take 1 tablet by mouth daily.   Marland Kitchen albuterol (PROVENTIL) (2.5 MG/3ML) 0.083% nebulizer solution Take 3 mLs (2.5 mg total) by nebulization every 6 (six) hours as needed for wheezing or shortness of breath.  . docusate sodium (COLACE) 100 MG capsule Take 100 mg by mouth daily as needed (For constipation.).    FAMILY HISTORY:  Her indicated that her mother is deceased. She indicated that her father is deceased. She indicated that her maternal grandmother is deceased. She indicated that her maternal grandfather is deceased. She indicated that her paternal grandmother is deceased. She indicated that her paternal grandfather is deceased. She indicated that the status of her unknown relative is unknown.  SOCIAL HISTORY: She  reports that she has never smoked. She has never used smokeless tobacco. She reports that she does not drink alcohol or use drugs.  REVIEW OF SYSTEMS:   Review of Systems  Constitutional: Positive for malaise/fatigue. Negative for chills and fever.  HENT: Negative.   Eyes: Negative.   Respiratory: Positive for shortness of breath. Negative for cough and wheezing.   Cardiovascular: Negative.  Negative for chest pain.  Gastrointestinal: Positive for diarrhea. Negative for abdominal pain, blood in stool, heartburn, melena, nausea and vomiting.  Genitourinary: Positive for dysuria and urgency. Negative for flank pain, frequency and hematuria.  Musculoskeletal: Negative.   Skin: Negative.   Neurological: Negative.   Endo/Heme/Allergies: Negative.   Psychiatric/Behavioral: Negative.    SUBJECTIVE:  Lying on ICU bed, Septic shock, Critically ill  VITAL  SIGNS: BP (!) 76/59 (BP Location: Right Wrist)   Pulse (!) 105   Temp 97.6 F (36.4 C) (Oral)   Resp (!) 23   Ht 5\' 6"  (1.676 m)   Wt 104.7 kg (230 lb 13.2 oz)   SpO2 99%   BMI 37.26 kg/m   HEMODYNAMICS:  BP 91/56 on no vasopressors   INTAKE / OUTPUT: No intake/output data recorded.  PHYSICAL EXAMINATION: General: WDWN Morbidly obese female, Awake/alert, critically ill due to septic shock Neuro: AAOx3, moving all extremities, PERRL HEENT: OP clear, MM moist  Cardiovascular: tachycardic with HR 100's with a regular rhythm Lungs: CTA b/l, speaking in full sentences with no accessory muscle use. Pox 99% on 2L O2. No respiratory distress.  Abdomen: Obese, Soft, NTND, BS hypoactive Musculoskeletal: 1+ BLE edema Skin: no rashes   LABS:  BMET LastLabs  Recent Labs  Lab 11/03/17 1805  NA 138  K 3.2*  CL 97*  CO2 28  BUN 52*  CREATININE 3.47*  GLUCOSE 136*     Electrolytes LastLabs     Recent Labs  Lab 11/03/17 1805  CALCIUM 9.0     CBC LastLabs     Recent Labs  Lab 11/03/17 1805  WBC 32.7*  HGB 8.7*  HCT 26.8*  PLT 259     Coag's LastLabs  Recent Labs  Lab 11/03/17 2202  APTT 35  INR 1.90     Sepsis Markers LastLabs      Recent Labs  Lab 11/03/17 2052 11/03/17 2202  LATICACIDVEN 1.28  --   PROCALCITON  --  31.11     ABG LastLabs  No results for input(s): PHART, PCO2ART, PO2ART in the last 168 hours.    Liver Enzymes LastLabs     Recent Labs  Lab 11/03/17 1805  AST 17  ALT 20  ALKPHOS 81  BILITOT 1.4*  ALBUMIN 2.6*     Cardiac Enzymes LastLabs     Recent Labs  Lab 11/03/17 2118  TROPONINI 0.09*     Glucose LastLabs  No results for input(s): GLUCAP in the last 168 hours.    Imaging Dg Chest 2 View  Result Date: 11/03/2017 CLINICAL DATA:  Shortness of breath, weakness, loss of appetite EXAM: CHEST - 2 VIEW COMPARISON:  10/02/2017 FINDINGS: Cardiomegaly with vascular  congestion. No overt edema. Patchy left suprahilar airspace opacity concerning for early infiltrate/pneumonia. No effusions or acute bony abnormality. IMPRESSION: Cardiomegaly, vascular congestion. Question left suprahilar upper lobe infiltrate/pneumonia. Electronically Signed   By: Rolm Baptise M.D.   On: 11/03/2017 19:07   CULTURES: Blood cultures (4/21): pending UA: consistent with UTI Urine culture (4/21) pending  ANTIBIOTICS: Ceftriaxone 4/21 Azithromycin 4/21 Vancomycin 4/22 >> Cefepime 4/22>>  SIGNIFICANT EVENTS: 4/21 PM: presented to ER with Decreased PO intake, Diarrhea and found to have Septic shock due to UTI and AKI 4/22 early AM: transferring to [facility]   LINES/TUBES: 1-20G PIV; (2nd IV being attempted now) Foley catheter (being placed now)  DISCUSSION: 71yoF with hx ESRD s/p Renal transplant (07/2015 at [facility]), CHF (EF 25-30%), Afib (on xarelto), DVT, Asthma, ICH (2009), OSA on CPAP, PVD, Morbid obesity, HTN, Gout, admitted with AKI and Septic shock due to UTI.    ASSESSMENT / PLAN:  PULMONARY 1. Acute Hypoxic Respiratory Failure; Pulmonary Infiltrate (favor edema, less likely pneumonia) - currently on 2L O2 with Pox 98%; could likely wean off O2 - CXR shows very faint LUL infiltrate that appears more consistent with pulmonary edema than with pneumonia; clinically patient denies cough or fever so again feel pneumonia unlikely - monitor respiratory status closely as we give IVF's for her shock. Wean O2 as tolerated  2. OSA:  - CPAP qHS and PRN  3. Asthma - no wheezing on exam; start pulmicort and brovana BID and Duonebs q6hrs  CARDIOVASCULAR 1. Afib; CHF - continue Xarelto for her Afib. HR 100's likely will improve with IVF's. Hold her home dose beta blocker given the shock - history of EF 25-30% although appears clinically dry; will need to be careful with fluid repletion not to overload her  2. Hx HTN; currently in Shock - hold home BP medicines  given the shock.   RENAL 1. AKI; History of Renal transplant: - creatinine 3.47 up from baseline of 0.87 (on 4/4) - renal ultrasound ordered (but was not performed prior to transfer to [facility]) - place foley catheter - hold tacrolimus and cellcept; check tacrolimus level.  - consulted Renal (Dr Jonnie Finner) who recommended starting hydrocortisone 50mg  IV q6hrs instead - Given her renal transplant status, there is concern for acute rejection. Will transfer to [facility] for further care since that is where she received her transplant. Gave signout to Dr Sabra Heck (CCM accepting attending) at [place] 1. Diarrhea: - check Cdiff PCR; enteric precautions until rule out. NPO; GI prophylaxis  HEMATOLOGIC 1. Hx DVT: on long term xarelto for  her Afib  INFECTIOUS 1. Septic Shock due to UTI: - s/p 1250cc IVF bolus in ER; BP improved from 78/57 to 91/56. Her 30cc/kg bolus is 3.5L which I dont think she will tolerate with her CHF. Will give an additional 1L LR now to run in slowly, then re-eval respiratory status and need for further IVF's at that time.  - UA consistent with UTI; Blood cultures pending.  - Not on vasopressors at this time although has soft BP.  - Received Ceftriaxone and Azithromycin in ER; has history of Pseudomonas (pan-sensitive) from a joint culture in the past. And has history of MRSA nares positive. Therefore will change antibiotics to Vancomycin and Cefepime.  - procalcitonin elevated at 31.1; recheck daily. Lactate 1.28; continue to trend. Cortisol level pending.  - diarrhea x 3wks but the description (small volume, intermittent, only after eating, loose not watery) does not sound like Cdiff; check Cdiff PCR but no empiric treatment.   ENDOCRINE No active issues; NPO; SSI PRN  NEUROLOGIC No active issues    FAMILY  - Updates: updated patient and her husband at ICU bedside  - Inter-disciplinary family meet or Palliative Care meeting due by:  11/10/17   60 minutes critical care time  Vernie Murders, MD  Pulmonary and [place] Pager: [phone]

## 2017-11-05 LAB — CALCIUM / CREATININE RATIO, URINE
Calcium, Ur: 1.4 mg/dL
Calcium/Creat.Ratio: 9 mg/g creat (ref 0–260)
Creatinine, Urine: 154 mg/dL

## 2017-11-05 LAB — TACROLIMUS LEVEL: Tacrolimus (FK506) - LabCorp: 8.1 ng/mL (ref 2.0–20.0)

## 2017-11-05 LAB — LEGIONELLA PNEUMOPHILA SEROGP 1 UR AG: L. pneumophila Serogp 1 Ur Ag: NEGATIVE

## 2017-11-06 LAB — URINE CULTURE: Culture: >=100000 — AB

## 2017-11-06 LAB — MYCOPHENOLIC ACID (CELLCEPT)
MPA Glucuronide: 96 ug/mL (ref 15–125)
MPA: 0.5 ug/mL — ABNORMAL LOW (ref 1.0–3.5)

## 2017-11-08 DIAGNOSIS — A419 Sepsis, unspecified organism: Secondary | ICD-10-CM

## 2017-11-08 HISTORY — DX: Sepsis, unspecified organism: A41.9

## 2017-11-08 HISTORY — DX: Other disorders of phosphorus metabolism: E83.39

## 2017-11-09 LAB — CULTURE, BLOOD (ROUTINE X 2)
Culture: NO GROWTH
Culture: NO GROWTH

## 2017-11-12 ENCOUNTER — Telehealth: Payer: Self-pay | Admitting: *Deleted

## 2017-11-12 DIAGNOSIS — M6281 Muscle weakness (generalized): Secondary | ICD-10-CM | POA: Diagnosis not present

## 2017-11-12 DIAGNOSIS — Z86718 Personal history of other venous thrombosis and embolism: Secondary | ICD-10-CM | POA: Diagnosis not present

## 2017-11-12 DIAGNOSIS — J8410 Pulmonary fibrosis, unspecified: Secondary | ICD-10-CM | POA: Diagnosis not present

## 2017-11-12 DIAGNOSIS — I480 Paroxysmal atrial fibrillation: Secondary | ICD-10-CM | POA: Diagnosis not present

## 2017-11-12 DIAGNOSIS — F419 Anxiety disorder, unspecified: Secondary | ICD-10-CM | POA: Diagnosis not present

## 2017-11-12 DIAGNOSIS — Z9989 Dependence on other enabling machines and devices: Secondary | ICD-10-CM | POA: Diagnosis not present

## 2017-11-12 DIAGNOSIS — R488 Other symbolic dysfunctions: Secondary | ICD-10-CM | POA: Diagnosis not present

## 2017-11-12 DIAGNOSIS — F4320 Adjustment disorder, unspecified: Secondary | ICD-10-CM | POA: Diagnosis not present

## 2017-11-12 DIAGNOSIS — N186 End stage renal disease: Secondary | ICD-10-CM | POA: Diagnosis not present

## 2017-11-12 DIAGNOSIS — Z7901 Long term (current) use of anticoagulants: Secondary | ICD-10-CM | POA: Diagnosis not present

## 2017-11-12 DIAGNOSIS — Z79899 Other long term (current) drug therapy: Secondary | ICD-10-CM | POA: Diagnosis not present

## 2017-11-12 DIAGNOSIS — N3001 Acute cystitis with hematuria: Secondary | ICD-10-CM | POA: Diagnosis not present

## 2017-11-12 DIAGNOSIS — Z940 Kidney transplant status: Secondary | ICD-10-CM | POA: Diagnosis not present

## 2017-11-12 DIAGNOSIS — I340 Nonrheumatic mitral (valve) insufficiency: Secondary | ICD-10-CM | POA: Diagnosis not present

## 2017-11-12 DIAGNOSIS — I132 Hypertensive heart and chronic kidney disease with heart failure and with stage 5 chronic kidney disease, or end stage renal disease: Secondary | ICD-10-CM | POA: Diagnosis not present

## 2017-11-12 DIAGNOSIS — A419 Sepsis, unspecified organism: Secondary | ICD-10-CM | POA: Diagnosis not present

## 2017-11-12 DIAGNOSIS — G4733 Obstructive sleep apnea (adult) (pediatric): Secondary | ICD-10-CM | POA: Diagnosis not present

## 2017-11-12 DIAGNOSIS — D631 Anemia in chronic kidney disease: Secondary | ICD-10-CM | POA: Diagnosis not present

## 2017-11-12 DIAGNOSIS — I120 Hypertensive chronic kidney disease with stage 5 chronic kidney disease or end stage renal disease: Secondary | ICD-10-CM | POA: Diagnosis not present

## 2017-11-12 DIAGNOSIS — N2581 Secondary hyperparathyroidism of renal origin: Secondary | ICD-10-CM | POA: Diagnosis not present

## 2017-11-12 DIAGNOSIS — D899 Disorder involving the immune mechanism, unspecified: Secondary | ICD-10-CM | POA: Diagnosis not present

## 2017-11-12 DIAGNOSIS — I361 Nonrheumatic tricuspid (valve) insufficiency: Secondary | ICD-10-CM | POA: Diagnosis not present

## 2017-11-12 DIAGNOSIS — I4891 Unspecified atrial fibrillation: Secondary | ICD-10-CM | POA: Diagnosis not present

## 2017-11-12 DIAGNOSIS — N179 Acute kidney failure, unspecified: Secondary | ICD-10-CM | POA: Diagnosis not present

## 2017-11-12 DIAGNOSIS — I5031 Acute diastolic (congestive) heart failure: Secondary | ICD-10-CM | POA: Diagnosis not present

## 2017-11-12 DIAGNOSIS — F329 Major depressive disorder, single episode, unspecified: Secondary | ICD-10-CM | POA: Diagnosis not present

## 2017-11-12 DIAGNOSIS — M109 Gout, unspecified: Secondary | ICD-10-CM | POA: Diagnosis not present

## 2017-11-12 DIAGNOSIS — R892 Abnormal level of other drugs, medicaments and biological substances in specimens from other organs, systems and tissues: Secondary | ICD-10-CM | POA: Diagnosis not present

## 2017-11-12 DIAGNOSIS — I483 Typical atrial flutter: Secondary | ICD-10-CM | POA: Diagnosis not present

## 2017-11-12 DIAGNOSIS — I129 Hypertensive chronic kidney disease with stage 1 through stage 4 chronic kidney disease, or unspecified chronic kidney disease: Secondary | ICD-10-CM | POA: Diagnosis not present

## 2017-11-12 DIAGNOSIS — R319 Hematuria, unspecified: Secondary | ICD-10-CM | POA: Diagnosis not present

## 2017-11-12 DIAGNOSIS — R6521 Severe sepsis with septic shock: Secondary | ICD-10-CM | POA: Diagnosis not present

## 2017-11-12 DIAGNOSIS — R809 Proteinuria, unspecified: Secondary | ICD-10-CM | POA: Diagnosis not present

## 2017-11-12 DIAGNOSIS — J45901 Unspecified asthma with (acute) exacerbation: Secondary | ICD-10-CM | POA: Diagnosis not present

## 2017-11-12 DIAGNOSIS — I825Y3 Chronic embolism and thrombosis of unspecified deep veins of proximal lower extremity, bilateral: Secondary | ICD-10-CM | POA: Diagnosis not present

## 2017-11-12 DIAGNOSIS — R262 Difficulty in walking, not elsewhere classified: Secondary | ICD-10-CM | POA: Diagnosis not present

## 2017-11-12 DIAGNOSIS — Z95828 Presence of other vascular implants and grafts: Secondary | ICD-10-CM | POA: Diagnosis not present

## 2017-11-12 DIAGNOSIS — N182 Chronic kidney disease, stage 2 (mild): Secondary | ICD-10-CM | POA: Diagnosis not present

## 2017-11-12 DIAGNOSIS — I481 Persistent atrial fibrillation: Secondary | ICD-10-CM | POA: Diagnosis not present

## 2017-11-12 DIAGNOSIS — N390 Urinary tract infection, site not specified: Secondary | ICD-10-CM | POA: Diagnosis not present

## 2017-11-12 DIAGNOSIS — I5041 Acute combined systolic (congestive) and diastolic (congestive) heart failure: Secondary | ICD-10-CM | POA: Diagnosis not present

## 2017-11-12 DIAGNOSIS — R0600 Dyspnea, unspecified: Secondary | ICD-10-CM | POA: Diagnosis not present

## 2017-11-12 DIAGNOSIS — Z9181 History of falling: Secondary | ICD-10-CM | POA: Diagnosis not present

## 2017-11-12 MED ORDER — CARVEDILOL 12.5 MG PO TABS
12.50 | ORAL_TABLET | ORAL | Status: DC
Start: 2017-11-12 — End: 2017-11-12

## 2017-11-12 MED ORDER — PREDNISONE 5 MG PO TABS
5.00 | ORAL_TABLET | ORAL | Status: DC
Start: 2017-11-13 — End: 2017-11-12

## 2017-11-12 MED ORDER — ACETAMINOPHEN 500 MG PO TABS
500.00 | ORAL_TABLET | ORAL | Status: DC
Start: ? — End: 2017-11-12

## 2017-11-12 MED ORDER — TACROLIMUS 0.5 MG PO CAPS
0.50 | ORAL_CAPSULE | ORAL | Status: DC
Start: 2017-11-13 — End: 2017-11-12

## 2017-11-12 MED ORDER — DOCUSATE SODIUM 100 MG PO CAPS
100.00 | ORAL_CAPSULE | ORAL | Status: DC
Start: ? — End: 2017-11-12

## 2017-11-12 MED ORDER — SERTRALINE HCL 50 MG PO TABS
50.00 | ORAL_TABLET | ORAL | Status: DC
Start: 2017-11-13 — End: 2017-11-12

## 2017-11-12 MED ORDER — ALBUTEROL SULFATE HFA 108 (90 BASE) MCG/ACT IN AERS
2.00 | INHALATION_SPRAY | RESPIRATORY_TRACT | Status: DC
Start: ? — End: 2017-11-12

## 2017-11-12 MED ORDER — MYCOPHENOLATE SODIUM 180 MG PO TBEC
180.00 | DELAYED_RELEASE_TABLET | ORAL | Status: DC
Start: 2017-11-12 — End: 2017-11-12

## 2017-11-12 MED ORDER — TACROLIMUS 0.5 MG PO CAPS
0.50 | ORAL_CAPSULE | ORAL | Status: DC
Start: 2017-11-12 — End: 2017-11-12

## 2017-11-12 MED ORDER — GENERIC EXTERNAL MEDICATION
2.00 | Status: DC
Start: 2017-11-12 — End: 2017-11-12

## 2017-11-12 MED ORDER — AMIODARONE HCL 200 MG PO TABS
200.00 | ORAL_TABLET | ORAL | Status: DC
Start: 2017-11-13 — End: 2017-11-12

## 2017-11-12 MED ORDER — RIVAROXABAN 15 MG PO TABS
15.00 | ORAL_TABLET | ORAL | Status: DC
Start: 2017-11-12 — End: 2017-11-12

## 2017-11-12 MED ORDER — ASPIRIN EC 81 MG PO TBEC
81.00 | DELAYED_RELEASE_TABLET | ORAL | Status: DC
Start: 2017-11-13 — End: 2017-11-12

## 2017-11-12 MED ORDER — FUROSEMIDE 40 MG PO TABS
40.00 | ORAL_TABLET | ORAL | Status: DC
Start: 2017-11-12 — End: 2017-11-12

## 2017-11-12 NOTE — Telephone Encounter (Signed)
Received Home Health Certification and Plan of Care; forwarded to provider/SLS 04/30

## 2017-11-13 ENCOUNTER — Non-Acute Institutional Stay (SKILLED_NURSING_FACILITY): Payer: Medicare Other | Admitting: Internal Medicine

## 2017-11-13 ENCOUNTER — Encounter: Payer: Self-pay | Admitting: Internal Medicine

## 2017-11-13 DIAGNOSIS — Z9989 Dependence on other enabling machines and devices: Secondary | ICD-10-CM | POA: Diagnosis not present

## 2017-11-13 DIAGNOSIS — F32A Depression, unspecified: Secondary | ICD-10-CM

## 2017-11-13 DIAGNOSIS — I483 Typical atrial flutter: Secondary | ICD-10-CM | POA: Diagnosis not present

## 2017-11-13 DIAGNOSIS — Z86718 Personal history of other venous thrombosis and embolism: Secondary | ICD-10-CM

## 2017-11-13 DIAGNOSIS — N3001 Acute cystitis with hematuria: Secondary | ICD-10-CM

## 2017-11-13 DIAGNOSIS — G4733 Obstructive sleep apnea (adult) (pediatric): Secondary | ICD-10-CM

## 2017-11-13 DIAGNOSIS — F329 Major depressive disorder, single episode, unspecified: Secondary | ICD-10-CM

## 2017-11-13 DIAGNOSIS — A419 Sepsis, unspecified organism: Secondary | ICD-10-CM | POA: Diagnosis not present

## 2017-11-13 DIAGNOSIS — I825Y3 Chronic embolism and thrombosis of unspecified deep veins of proximal lower extremity, bilateral: Secondary | ICD-10-CM | POA: Diagnosis not present

## 2017-11-13 DIAGNOSIS — I480 Paroxysmal atrial fibrillation: Secondary | ICD-10-CM | POA: Diagnosis not present

## 2017-11-13 DIAGNOSIS — R6521 Severe sepsis with septic shock: Secondary | ICD-10-CM | POA: Diagnosis not present

## 2017-11-13 DIAGNOSIS — N179 Acute kidney failure, unspecified: Secondary | ICD-10-CM | POA: Diagnosis not present

## 2017-11-13 DIAGNOSIS — I5031 Acute diastolic (congestive) heart failure: Secondary | ICD-10-CM

## 2017-11-13 NOTE — Progress Notes (Signed)
: Provider:   Location:  [place] Room Number: 102 Place of Service:  SNF [phone])  Provider: Noah Delaine. Sheppard Coil, MD  PCP: Carollee Herter, Alferd Apa, DO Patient Care Team: Carollee Herter, Alferd Apa, DO as PCP - General Sueanne Margarita, MD as Consulting Physician (Cardiology) Paralee Cancel, MD as Consulting Physician (Orthopedic Surgery) Fleet Contras, MD as Consulting Physician (Nephrology) Obgyn, [facility]  Extended Emergency Contact Information Primary Emergency Contact: Guagliardo,Fredrick Address: [address]          [place], [place] [address] Johnnette Litter of [place] Phone: [phone] Mobile Phone: [phone] Relation: Spouse Secondary Emergency Contact: Charlestine Massed, Sloan [place] of [place] Phone: [phone] Mobile Phone: [phone] Relation: Daughter     Allergies: Ace inhibitors and Warfarin sodium  Chief Complaint  Patient presents with  . New Admit To SNF    HPI: Patient is 72 y.o. female with hypertension, paroxysmal atrial fib, CVA, end-stage renal disease, status post renal transplant, anemia, who presented to [facility] ED with complaints of diarrhea for the past week, slight cough.  Patient denied fever, chills, chest pain, palpitations, shortness of breath, nausea and vomiting, abdominal pain, constipation, bright red blood per rectum, black stool, dysuria, hematuria.  Patient did complain of general malaise.  In the ED chest x-ray showed cardiomegaly with vascular congestion with a questionable left hilar upper lobe infiltrate.Potassium was 3.2 BUN 52 and creatinine 3.47;; WBC was 32.7 with a hemoglobin of 8.7; patient was admitted to [facility] from 4/20 2-30 for UTI, acute renal failure, diarrhea, and questionable pneumonia.  Patient was felt to have septic shock due to UTI with acute kidney injury which is raising concern for acute rejection.  Patient  was treated with vancomycin and cefepime with improvement with bolus of normal saline bringing blood pressure from 78/50 7-90 1/56.  Chest x-ray was felt most likely to be pulmonary edema so pulmonary status was closely watched but patient still needed IV fluids for her shock.  Per patient's renal doctor patient was started on hydrocortisone 50 mg IV every 6 hours while her rejection medications were held.  She did well and and all problems resolved and creatinine trended down.  Patient is admitted to skilled nursing facility for OT/PT.  While at skilled nursing facility patient will be followed for atrial fibrillation treated with amiodarone and Xarelto, renal transplant status treated with Myfortic, Prograf and Deltasone, and depression treated with Zoloft.  Past Medical History:  Diagnosis Date  . Acute diastolic (congestive) heart failure ([place]) 08/12/2017  . AKI (acute kidney injury) ([place]) 11/04/2017  . Anemia   . Arthritis    HIP  . Asthma    as a child  . Atrial flutter [facility])    s/p aflutter ablation at [facility]  . Blood transfusion    never had a reaction to blood transfusion  . Cardiomyopathy Mar 2013   Mild, EF 50-55% by Mar 2013 ECHO, diast dysfxn II  . Closed fracture of right distal radius 10/18/2016  . CMV (cytomegalovirus infection) ([place]) 10/03/2015  . Constipation    takes Miralax daily  . Depression    takes Zoloft daily  . Distal radius fracture, right   . DVT of lower extremity, bilateral ([place]) 12/21/11   "they're there now; been there for 2 wks"  . Eczema   . ESRD (end stage renal disease) on dialysis ([place])  09/2011   07-19-2015 had Kidney transplant at [facility]  . Fractures, stress    in both feet--6 OR 7 YRS AGO--RESOLVED  . Generalized edema [id number]  . Gout    doesn't require meds   . HCAP (healthcare-associated pneumonia)    09/10/11  . Hearing difficulty   . Hypercalcemia    09/10/11  . Hyperparathyroidism ([place]) 04/07/2013  . Hypertension    takes Diltiazem daily     . Hypomagnesemia 08/02/2015  . Hypophosphatemia 11/08/2017  . Immunosuppression ([place]) 07/25/2015  . Memory changes   . Morbid obesity ([place])   . Nonischemic dilated cardiomyopathy ([place])   . Obesity 04/07/2013  . Oligouria   . PAF (paroxysmal atrial fibrillation) ([facility])     HX OF CEREBRAL BLEED WHILE ON COUMADIN-SO PT NOT ON ANY BLOOD THINNERS NOW  . Peripheral vascular disease ([place])   . Presence of Watchman left atrial appendage closure device 10/04/2017  . Proteinuria 09/04/2016  . Psoriasis 08/07/2016  . 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 Sepsis ([place]) 11/08/2017  . Sleep apnea    uses CPAP nightly  . Stroke [facility]) 2009   denies residual, hemorrhagic now off coumadin  . Stroke due to intracerebral hemorrhage ([place]) 2009  . Suture reaction 09/04/2016    Past Surgical History:  Procedure Laterality Date  . AV FISTULA PLACEMENT  09/28/2011   Procedure: ARTERIOVENOUS (AV) FISTULA CREATION;  Surgeon: Rosetta Posner, MD;  Location: [place];  Service: Vascular;  Laterality: Left;  . CARDIAC VALVE SURGERY  1960    FOR ATRIAL SEPTAL DEFECT  . CHOLECYSTECTOMY     1980's  . COLONOSCOPY    . CYSTOSCOPY     many yrs ago  . FEMUR FRACTURE SURGERY  03/2011; 09/03/2011   right; "had 2, 2 wk apart in 2012; broke it again 08/2011 & had OR"  . I&D EXTREMITY  09/15/2011   Procedure: IRRIGATION AND DEBRIDEMENT EXTREMITY w REMOVAL OF HARDWARE;  Surgeon: [name], MD;  Location: [place];  Service: Orthopedics;  Laterality: Right;  . INCISION AND DRAINAGE HIP  12/21/2011   Procedure: IRRIGATION AND DEBRIDEMENT HIP;  Surgeon: [name], MD;  Location: [place];  Service: Orthopedics;  Laterality: Right;  I&D RIGHT HIP WITH PLACEMENT ANTIBIOTIC SPACER  . INSERTION OF DIALYSIS CATHETER     Procedure: INSERTION OF DIALYSIS CATHETER;  Surgeon: Rosetta Posner, MD;  Location: [place];  Service: Vascular;  Laterality: Right;  . IRRIGATION AND DEBRIDEMENT ABSCESS  12/21/11   right hip  . JOINT REPLACEMENT    .  KIDNEY TRANSPLANT  07/19/15  . LAPAROSCOPIC GASTRIC BANDING    . OPEN REDUCTION INTERNAL FIXATION (ORIF) DISTAL RADIAL FRACTURE Right 10/18/2016   Procedure: OPEN REDUCTION INTERNAL FIXATION (ORIF) RIGHT DISTAL RADIAL FRACTURE;  Surgeon: Marchia Bond, MD;  Location: [place];  Service: Orthopedics;  Laterality: Right;  . TOTAL HIP ARTHROPLASTY  02/2011   right THA 02/2011, I&D/removal of hardware 09/2011,, repeat I&D Jun 2013, reimplantation R THA 03-26-2012  . TOTAL HIP REVISION  03/25/2012   Procedure: TOTAL HIP REVISION;  Surgeon: [name], MD;  Location: [facility];  Service: Orthopedics;  Laterality: Right;  Right Total Hip Reimplantation  . VENA CAVA FILTER PLACEMENT  11/2011    Allergies as of 11/13/2017      Reactions   Ace Inhibitors Other (See Comments)   Reaction unknown   Warfarin Sodium Other (See Comments)   Caused her to have a stroke  Medication List        Accurate as of 11/13/17 12:19 PM. Always use your most recent med list.          acetaminophen 500 MG tablet Commonly known as:  TYLENOL Take 500 mg by mouth every 6 (six) hours as needed.   albuterol 108 (90 Base) MCG/ACT inhaler Commonly known as:  PROVENTIL HFA;VENTOLIN HFA Inhale 2 puffs into the lungs every 6 (six) hours as needed for wheezing or shortness of breath.   amiodarone 200 MG tablet Commonly known as:  PACERONE Take 200 mg by mouth daily.   arformoterol 15 MCG/2ML Nebu Commonly known as:  BROVANA Take 2 mLs (15 mcg total) by nebulization 2 (two) times daily.   aspirin EC 81 MG tablet Take 81 mg by mouth.   budesonide 0.5 MG/2ML nebulizer solution Commonly known as:  PULMICORT Take 2 mLs (0.5 mg total) by nebulization 2 (two) times daily.   budesonide-formoterol 160-4.5 MCG/ACT inhaler Commonly known as:  SYMBICORT Inhale 2 puffs into the lungs 2 (two) times daily.   carvedilol 12.5 MG tablet Commonly known as:  COREG Take 12.5 mg by mouth 2 (two) times daily with a  meal.   docusate sodium 100 MG capsule Commonly known as:  COLACE Take 100 mg by mouth daily as needed (For constipation.).   furosemide 40 MG tablet Commonly known as:  LASIX Take 40 mg by mouth 2 (two) times daily.   hydrocortisone sodium succinate 100 MG Solr injection Commonly known as:  SOLU-CORTEF Inject 1 mL (50 mg total) into the vein every 6 (six) hours.   insulin aspart 100 UNIT/ML injection Commonly known as:  novoLOG Inject 1-3 Units into the skin every 4 (four) hours.   ipratropium-albuterol 0.5-2.5 (3) MG/3ML Soln Commonly known as:  DUONEB Take 3 mLs by nebulization every 6 (six) hours.   MYFORTIC 180 MG EC tablet Generic drug:  mycophenolate Take 180 mg by mouth 2 (two) times daily.   norepinephrine 4 mg in dextrose 5 % 250 mL Inject 5-50 mcg/min into the vein continuous.   ondansetron 4 MG/2ML Soln injection Commonly known as:  ZOFRAN Inject 2 mLs (4 mg total) into the vein every 6 (six) hours as needed for nausea.   predniSONE 5 MG tablet Commonly known as:  DELTASONE Take 5 mg by mouth daily with breakfast.   rivaroxaban 20 MG Tabs tablet Commonly known as:  XARELTO Take 1 tablet (20 mg total) by mouth daily.   sertraline 50 MG tablet Commonly known as:  ZOLOFT Take 1 tablet (50 mg total) by mouth daily.   tacrolimus 0.5 MG capsule Commonly known as:  PROGRAF Take 0.5 mg by mouth 2 (two) times daily.   Vancomycin 750-5 MG/150ML-% Soln Commonly known as:  VANCOCIN Inject 150 mLs (750 mg total) into the vein every other day.       No orders of the defined types were placed in this encounter.   Immunization History  Administered Date(s) Administered  . Influenza Split 03/27/2012  . Influenza Whole 04/29/2008  . Influenza, High Dose Seasonal PF 04/19/2017  . Influenza,inj,Quad PF,6+ Mos 04/24/2013  . Influenza-Unspecified 04/29/2014, 03/17/2015, 04/19/2016  . Pneumococcal Conjugate-13 03/02/2014  . Pneumococcal Polysaccharide-23  04/25/2005, 09/18/2016  . Tdap 05/08/2013    Social History   Tobacco Use  . Smoking status: Never Smoker  . Smokeless tobacco: Never Used  Substance Use Topics  . Alcohol use: No    Family history is   Family History  Problem  Relation Age of Onset  . Cancer Father   . Diabetes Mother   . Hypertension Mother   . Arthritis Mother   . Hodgkin's lymphoma Unknown 32       dscd---HODGKINS DISEASE  . Hypertension Brother       Review of Systems  DATA OBTAINED: from patient, nurse GENERAL:  no fevers, fatigue, appetite changes SKIN: No itching, or rash EYES: No eye pain, redness, discharge EARS: No earache, tinnitus, change in hearing NOSE: No congestion, drainage or bleeding  MOUTH/THROAT: No mouth or tooth pain, No sore throat RESPIRATORY: No cough, wheezing, SOB CARDIAC: No chest pain, palpitations, lower extremity edema  GI: No abdominal pain, No N/V/D or constipation, No heartburn or reflux  GU: No dysuria, frequency or urgency, or incontinence  MUSCULOSKELETAL: No unrelieved bone/joint pain NEUROLOGIC: No headache, dizziness or focal weakness PSYCHIATRIC: No c/o anxiety or sadness   Vitals:   11/13/17 1148  BP: 138/80  Pulse: 100  Resp: (!) 24  Temp: 99.4 F (37.4 C)    SpO2 Readings from Last 1 Encounters:  11/04/17 100%   Body mass index is 37.12 kg/m.     Physical Exam  GENERAL APPEARANCE: Alert, conversant,  No acute distress.  SKIN: No diaphoresis rash HEAD: Normocephalic, atraumatic  EYES: Conjunctiva/lids clear. Pupils round, reactive. EOMs intact.  EARS: External exam WNL, canals clear. Hearing grossly normal.  NOSE: No deformity or discharge.  MOUTH/THROAT: Lips w/o lesions  RESPIRATORY: Breathing is even, unlabored. Lung sounds are clear   CARDIOVASCULAR: Heart irreg no murmurs, rubs or gallops. No peripheral edema.   GASTROINTESTINAL: Abdomen is soft, non-tender, not distended w/ normal bowel sounds. GENITOURINARY: Bladder non tender,  not distended  MUSCULOSKELETAL: No abnormal joints or musculature NEUROLOGIC:  Cranial nerves 2-12 grossly intact. Moves all extremities  PSYCHIATRIC: Mood and affect appropriate to situation, no behavioral issues  Patient Active Problem List   Diagnosis Date Noted  . UTI (urinary tract infection) 11/03/2017  . Diarrhea 11/03/2017  . Septic shock ([place]) 11/03/2017  . Chronic respiratory failure with hypoxia ([place]) 10/03/2017  . Acute respiratory failure with hypoxia ([place]) 09/04/2017  . Dyspnea 07/26/2017  . Atrial flutter ([place])   . Peripheral vascular disease ([place])   . Oligouria   . Hypertension   . Fractures, stress   . Eczema   . Distal radius fracture, right   . Depression   . Constipation   . Arthritis   . Exacerbation of asthma 04/01/2017  . Closed fracture of right distal radius 06-Nov-2016  . Deceased-donor kidney transplant recipient 09/13/2015  . Nonischemic dilated cardiomyopathy ([place])   . Skin ulceration ([place]) 05/09/2013  . Abscess of shoulder 05/15/2012  . Expected blood loss anemia 03/26/2012  . S/P right TH revision 03/25/2012  . Abscess of hip, right 12/24/2011  . DVT of lower extremity, bilateral ([place]) 12/21/2011  . DVT (deep venous thrombosis), left 12/08/2011  . OSA on CPAP 12/08/2011  . End stage renal disease ([place]) 11/06/2011  . 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[place]) 10/24/2011  . Pseudomonas aeruginosa infection 10/24/2011  . ESRD (end stage renal disease) on dialysis ([place]) 09/14/2011  . Hypercalcemia   . Community acquired pneumonia of left upper lobe of lung ([place]) 09/10/2011  . Gout 09/10/2011  . Absence of right hip joint with antibiotic pacer 09/03/2011  . Traumatic seroma of thigh ([place]) 06/11/2011  . Clostridium difficile colitis 06/09/2011  . Leukocytosis 06/08/2011  . Anemia 06/06/2011  . ARF (acute renal failure) ([place]) 06/06/2011  . SINUSITIS- ACUTE-NOS  09/27/2010  . DEPRESSION 11/25/2008  . PAF (paroxysmal atrial fibrillation) ([place]) 11/25/2008  .  RENAL FAILURE, ACUTE 11/25/2008  . CVA 09/28/2008  . 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 WEAKNESS, LEFT SIDE OF BODY 09/21/2008  . Morbid obesity ([place]) 10/20/2007  . Stroke due to intracerebral hemorrhage ([place]) 07/17/2007  . Stroke ([place]) 07/17/2007  . Essential hypertension 07/07/2007  . Asthma 07/07/2007      Labs reviewed: Basic Metabolic Panel:    Component Value Date/Time   NA 138 11/03/2017 1805   K 3.2 (L) 11/03/2017 1805   CL 97 (L) 11/03/2017 1805   CO2 28 11/03/2017 1805   GLUCOSE 136 (H) 11/03/2017 1805   BUN 52 (H) 11/03/2017 1805   CREATININE 3.47 (H) 11/03/2017 1805   CALCIUM 9.0 11/03/2017 1805   CALCIUM 11.1 (H) 09/11/2011 0825   PROT 6.4 (L) 11/03/2017 1805   ALBUMIN 2.6 (L) 11/03/2017 1805   AST 17 11/03/2017 1805   ALT 20 11/03/2017 1805   ALKPHOS 81 11/03/2017 1805   BILITOT 1.4 (H) 11/03/2017 1805   GFRNONAA 12 (L) 11/03/2017 1805   GFRAA 14 (L) 11/03/2017 1805    Recent Labs    11/03/17 1805  NA 138  K 3.2*  CL 97*  CO2 28  GLUCOSE 136*  BUN 52*  CREATININE 3.47*  CALCIUM 9.0   Liver Function Tests: Recent Labs    11/03/17 1805  AST 17  ALT 20  ALKPHOS 81  BILITOT 1.4*  PROT 6.4*  ALBUMIN 2.6*   Recent Labs    11/03/17 1805  LIPASE 30   No results for input(s): AMMONIA in the last 8760 hours. CBC: Recent Labs    11/03/17 1805  WBC 32.7*  HGB 8.7*  HCT 26.8*  MCV 77.0*  PLT 259   Lipid No results for input(s): CHOL, HDL, LDLCALC, TRIG in the last 8760 hours.  Cardiac Enzymes: Recent Labs    11/03/17 2118  TROPONINI 0.09*   BNP: Recent Labs    11/03/17 1805  BNP 525.4*   No results found for: [facility] Lab Results  Component Value Date   HGBA1C  11/16/2008    5.5 (NOTE) The ADA recommends the following therapeutic goal for glycemic control related to Hgb A1c measurement: Goal of therapy: <6.5 Hgb A1c  Reference: American Diabetes Association: Clinical Practice Recommendations  2010, Diabetes Care, 2010, 33: (Suppl  1).   Lab Results  Component Value Date   TSH 2.655 09/11/2011   Lab Results  Component Value Date   [id number] 664 09/11/2011   Lab Results  Component Value Date   FOLATE 6.0 09/11/2011   Lab Results  Component Value Date   IRON 68 09/18/2011   TIBC 144 (L) 09/18/2011   FERRITIN 263 09/18/2011    Imaging and Procedures obtained prior to SNF admission: Dg Chest 2 View  Result Date: 11/03/2017 CLINICAL DATA:  Shortness of breath, weakness, loss of appetite EXAM: CHEST - 2 VIEW COMPARISON:  10/02/2017 FINDINGS: Cardiomegaly with vascular congestion. No overt edema. Patchy left suprahilar airspace opacity concerning for early infiltrate/pneumonia. No effusions or acute bony abnormality. IMPRESSION: Cardiomegaly, vascular congestion. Question left suprahilar upper lobe infiltrate/pneumonia. Electronically Signed   By: Rolm Baptise M.D.   On: 11/03/2017 19:07     Not all labs, radiology exams or other studies done during hospitalization come through on my EPIC note; however they are reviewed by me.    Assessment and Plan  Sepsis/shock/UTI- patient was treated with vancomycin and cefepime  in the setting of a history of Pseudomonas pansensitive from a urine culture in the past along with a history of MRSA nares positive procalcitonin acetone was elevated at 31.1 with IV fluids it trended down to 24.5 WBCs trended down lactic acid trended down O2 3 L nasal cannula at night thinks diagnosed with coronavirus in February change IV antibiotic to Cipro completed antibiotics on 4/26 SNF -admitted for OT/PT  Acute kidney injury-serum creatinine 3.47 on admission from baseline of 0.9 end-stage renal disease secondary to presumed hypertension on hemodialysis since 10/08/2011 via left AV fistula; biopsy for elevated creatinine and proteinuria on 1/14 2019-results no evidence of  rejection; unlikely to be junction in setting of sepsis followed by [place]  kidney from nephrology; at outside hospital patient was given hydrocortisone 50 mg IV instead of resuming her CellCept and tacrolimus urine and serum BK negative, EBV and CMV PCR quantitative negative urine output is increasing ICU spoke with transplant team they recommend continuing tacrolimus and prednisone 5 mg daily mycophenolate acid being held secondary to sepsis this was restarted prior to discharge SNF -continue Prograf 0.5 mg every morning and nightly and Myfortic 180 mg twice daily along with Deltasone 5 mg daily; follow-up BMP  Atrial flutter with RVR- watchman procedure performed 3/22, status post ablation 07/2015; status post IVC filter after history of intracranial hemorrhage EKG with atrial fibrillation; SNF -continue amiodarone 200 mg daily, continue Xarelto 20 mg daily continue aspirin 81 daily  Acute diastolic congestive heart failure- now patient with  heart failure with preserved ejection fraction-TTE 3/19 left ventricular systolic function mildly reduced, LV ejection fraction 40 to 45% repeat ETT with EF 50 to 55% severe tricuspid  regurgitation overall no significant changes compared to prior TTE SNF -continue Coreg 12.5 mg twice daily, Lasix 40 mg twice daily  OSA SNF -to new CPAP  Depression SNF -continue Zoloft 50 mg daily  Personal history of DVT-status post IVC filter SNF -continue Xarelto 20 mg daily   Time spent greater than 45 minutes;> 50% of time with patient was spent reviewing records, labs, tests and studies, counseling and developing plan of care  Daryll Drown. Sheppard Coil, MD

## 2017-11-16 ENCOUNTER — Encounter: Payer: Self-pay | Admitting: Internal Medicine

## 2017-11-17 DIAGNOSIS — N179 Acute kidney failure, unspecified: Secondary | ICD-10-CM

## 2017-11-17 DIAGNOSIS — N189 Chronic kidney disease, unspecified: Secondary | ICD-10-CM

## 2017-11-17 DIAGNOSIS — I5031 Acute diastolic (congestive) heart failure: Secondary | ICD-10-CM | POA: Insufficient documentation

## 2017-11-17 HISTORY — DX: Acute kidney failure, unspecified: N17.9

## 2017-11-17 HISTORY — DX: Chronic kidney disease, unspecified: N18.9

## 2017-11-19 DIAGNOSIS — I340 Nonrheumatic mitral (valve) insufficiency: Secondary | ICD-10-CM | POA: Diagnosis not present

## 2017-11-19 DIAGNOSIS — I481 Persistent atrial fibrillation: Secondary | ICD-10-CM | POA: Diagnosis not present

## 2017-11-19 DIAGNOSIS — I361 Nonrheumatic tricuspid (valve) insufficiency: Secondary | ICD-10-CM | POA: Diagnosis not present

## 2017-11-29 DIAGNOSIS — D899 Disorder involving the immune mechanism, unspecified: Secondary | ICD-10-CM | POA: Diagnosis not present

## 2017-11-29 DIAGNOSIS — N2581 Secondary hyperparathyroidism of renal origin: Secondary | ICD-10-CM | POA: Diagnosis not present

## 2017-11-29 DIAGNOSIS — I129 Hypertensive chronic kidney disease with stage 1 through stage 4 chronic kidney disease, or unspecified chronic kidney disease: Secondary | ICD-10-CM | POA: Diagnosis not present

## 2017-11-29 DIAGNOSIS — R809 Proteinuria, unspecified: Secondary | ICD-10-CM | POA: Diagnosis not present

## 2017-11-29 DIAGNOSIS — Z940 Kidney transplant status: Secondary | ICD-10-CM | POA: Diagnosis not present

## 2017-11-29 DIAGNOSIS — N182 Chronic kidney disease, stage 2 (mild): Secondary | ICD-10-CM | POA: Diagnosis not present

## 2017-11-29 DIAGNOSIS — D631 Anemia in chronic kidney disease: Secondary | ICD-10-CM | POA: Diagnosis not present

## 2017-11-29 DIAGNOSIS — Z79899 Other long term (current) drug therapy: Secondary | ICD-10-CM | POA: Diagnosis not present

## 2017-11-29 DIAGNOSIS — R319 Hematuria, unspecified: Secondary | ICD-10-CM | POA: Diagnosis not present

## 2017-11-29 DIAGNOSIS — N179 Acute kidney failure, unspecified: Secondary | ICD-10-CM | POA: Diagnosis not present

## 2017-11-29 DIAGNOSIS — M109 Gout, unspecified: Secondary | ICD-10-CM | POA: Diagnosis not present

## 2017-12-02 ENCOUNTER — Encounter: Payer: Self-pay | Admitting: Pulmonary Disease

## 2017-12-02 ENCOUNTER — Ambulatory Visit (INDEPENDENT_AMBULATORY_CARE_PROVIDER_SITE_OTHER): Payer: Medicare Other | Admitting: Pulmonary Disease

## 2017-12-02 VITALS — BP 138/80 | HR 100 | Ht 65.0 in | Wt 220.0 lb

## 2017-12-02 DIAGNOSIS — J8410 Pulmonary fibrosis, unspecified: Secondary | ICD-10-CM | POA: Diagnosis not present

## 2017-12-02 DIAGNOSIS — J45901 Unspecified asthma with (acute) exacerbation: Secondary | ICD-10-CM

## 2017-12-02 DIAGNOSIS — R892 Abnormal level of other drugs, medicaments and biological substances in specimens from other organs, systems and tissues: Secondary | ICD-10-CM

## 2017-12-02 LAB — PULMONARY FUNCTION TEST
DL/VA % pred: 83 %
DL/VA: 4.1 ml/min/mmHg/L
DLCO unc % pred: 43 %
DLCO unc: 11.25 ml/min/mmHg
FEF 25-75 Post: 0.75 L/sec
FEF 25-75 Pre: 0.85 L/sec
FEF2575-%Change-Post: -11 %
FEF2575-%Pred-Post: 42 %
FEF2575-%Pred-Pre: 47 %
FEV1-%Change-Post: -1 %
FEV1-%Pred-Post: 51 %
FEV1-%Pred-Pre: 52 %
FEV1-Post: 1 L
FEV1-Pre: 1.02 L
FEV1FVC-%Change-Post: 3 %
FEV1FVC-%Pred-Pre: 98 %
FEV6-%Change-Post: -4 %
FEV6-%Pred-Post: 52 %
FEV6-%Pred-Pre: 54 %
FEV6-Post: 1.26 L
FEV6-Pre: 1.32 L
FEV6FVC-%Change-Post: 0 %
FEV6FVC-%Pred-Post: 103 %
FEV6FVC-%Pred-Pre: 103 %
FVC-%Change-Post: -4 %
FVC-%Pred-Post: 50 %
FVC-%Pred-Pre: 53 %
FVC-Post: 1.27 L
FVC-Pre: 1.33 L
Post FEV1/FVC ratio: 79 %
Post FEV6/FVC ratio: 99 %
Pre FEV1/FVC ratio: 77 %
Pre FEV6/FVC Ratio: 99 %
RV % pred: 73 %
RV: 1.64 L
TLC % pred: 55 %
TLC: 2.89 L

## 2017-12-02 NOTE — Progress Notes (Signed)
   Subjective:    Patient ID: [name], female    DOB: 08-07-1945, 72 y.o.   MRN: [id number]  HPI  72 year old never smoker for FU of chronic cough  She is a renal transplant recipient in 07/2015, end-stage renal disease on dialysis since 2013, history of ICH in 2010 on warfarin and history of DVT/PE status post IVC filter.  She has heart failure with preserved EF and atrial fibrillation status post ablation in 2017. She was evaluated by her PCP in October 2018 and told that she may have asthma.  She reports asthma as a child but this never bothered her as an adult.   She was hospitalized 1/31 for atrial for ablation with RVR and an exacerbation of acute diastolic heart failure.  Respiratory viral panel was positive for coronavirus. Since then she underwent watchman placement and attempted cardioversion.  She is maintained on amiodarone 200 mg  Serial chest x-rays reviewed show bilateral interstitial prominence PFTs were reviewed today. On her last visit she was given Symbicort which is really helped her breathing.  In the interim she was hospitalized for UTI and was given a lot of fill fluids so that she developed significant pedal edema in spite of her daily Lasix. Creatinine 10/2017 noted to be 3.7  Significant tests/ events reviewed  Spirometry 09/2017 significant restriction with FVC 25%, FEV1 28%.  Ratio 87.  Decreased mid flows.   PFTs 11/2017-moderate restriction, FVC 53%, TLC 55%, DLCO 43%, no obstruction   Review of Systems neg for any significant sore throat, dysphagia, itching, sneezing, nasal congestion or excess/ purulent secretions, fever, chills, sweats, unintended wt loss, pleuritic or exertional cp, hempoptysis, orthopnea pnd or change in chronic leg swelling. Also denies presyncope, palpitations, heartburn, abdominal pain, nausea, vomiting, diarrhea or change in bowel or urinary habits, dysuria,hematuria, rash, arthralgias, visual complaints, headache, numbness  weakness or ataxia.     Objective:   Physical Exam   Gen. Pleasant, obese, in no distress, in whelchair ENT - no lesions, no post nasal drip Neck: No JVD, no thyromegaly, no carotid bruits Lungs: no use of accessory muscles, no dullness to percussion, decreased without rales or rhonchi  Cardiovascular: Rhythm regular, heart sounds  normal, no murmurs or gallops, 2+ peripheral edema Musculoskeletal: No deformities, no cyanosis or clubbing , no tremors       Assessment & Plan:

## 2017-12-02 NOTE — Patient Instructions (Signed)
High-resolution CT scan of the chest to look for scarring in the lungs due to amiodarone.  Lung function shows effect of restriction in the lungs. Diffusion capacity is low at 43%. Check with your heart doctors if you should continue on amiodarone   Meanwhile continue on Symbicort

## 2017-12-02 NOTE — Assessment & Plan Note (Signed)
Continue on Symbicort.  For at least 3 months and then consider stepdown  not convinced this is true asthma

## 2017-12-02 NOTE — Progress Notes (Signed)
PFT done today. 

## 2017-12-04 ENCOUNTER — Non-Acute Institutional Stay (SKILLED_NURSING_FACILITY): Payer: Medicare Other | Admitting: Internal Medicine

## 2017-12-04 ENCOUNTER — Encounter: Payer: Self-pay | Admitting: Internal Medicine

## 2017-12-04 DIAGNOSIS — Z9989 Dependence on other enabling machines and devices: Secondary | ICD-10-CM | POA: Diagnosis not present

## 2017-12-04 DIAGNOSIS — I483 Typical atrial flutter: Secondary | ICD-10-CM

## 2017-12-04 DIAGNOSIS — G4733 Obstructive sleep apnea (adult) (pediatric): Secondary | ICD-10-CM

## 2017-12-04 DIAGNOSIS — Z86718 Personal history of other venous thrombosis and embolism: Secondary | ICD-10-CM

## 2017-12-04 DIAGNOSIS — R6521 Severe sepsis with septic shock: Secondary | ICD-10-CM | POA: Diagnosis not present

## 2017-12-04 DIAGNOSIS — F329 Major depressive disorder, single episode, unspecified: Secondary | ICD-10-CM

## 2017-12-04 DIAGNOSIS — N179 Acute kidney failure, unspecified: Secondary | ICD-10-CM

## 2017-12-04 DIAGNOSIS — A419 Sepsis, unspecified organism: Secondary | ICD-10-CM | POA: Diagnosis not present

## 2017-12-04 DIAGNOSIS — I5031 Acute diastolic (congestive) heart failure: Secondary | ICD-10-CM | POA: Diagnosis not present

## 2017-12-04 DIAGNOSIS — N3001 Acute cystitis with hematuria: Secondary | ICD-10-CM | POA: Diagnosis not present

## 2017-12-04 DIAGNOSIS — F32A Depression, unspecified: Secondary | ICD-10-CM

## 2017-12-04 NOTE — Progress Notes (Signed)
Provider: Noah Delaine. Sheppard Coil, M.D. Location:  [place] Room Number: 157-W Place of Service:  SNF (31)  PCP: [name], DO Patient Care Team: [name], DO as PCP - General Sueanne Margarita, MD as Consulting Physician (Cardiology) Paralee Cancel, MD as Consulting Physician (Orthopedic Surgery) Fleet Contras, MD as Consulting Physician (Nephrology) Obgyn, [facility]  Extended Emergency Contact Information Primary Emergency Contact: Schanz,Fredrick Address: [address]          [place], [place] [address] Johnnette Litter of [place] Phone: [phone] Mobile Phone: [phone] Relation: Spouse Secondary Emergency Contact: Charlestine Massed, [place] of [place] Phone: [phone] Mobile Phone: [phone] Relation: Daughter  Allergies  Allergen Reactions  . Ace Inhibitors Other (See Comments)    Reaction unknown  . Warfarin Sodium Other (See Comments)    Caused her to have a stroke    Chief Complaint  Patient presents with  . Discharge Note    Discharge from [facility]     HPI:  72 y.o. female with hypertension, paroxysmal atrial fib, CVA, end-stage renal disease, status post renal transplant, anemia, who presented to [facility] ED with complaints of diarrhea for a week.  Patient was admitted to [facility] from 4/20 2-30 for a UTI, acute renal failure, diarrhea, and questionable pneumonia.  Patient was felt to have septic shock due to UTI with acute kidney injury which raise concern for acute rejection.  Patient was treated with vancomycin and cefepime with improvement in bolus of normal saline bringing blood pressure from 78/50 to 79/56.  Chest x-ray is felt most likely to be pulmonary edema so pulmonary status was closely watched but still needed IV fluids for her shock.  Patient's renal doctor recommended hydrocortisone 50 mg IV every 6  hours while her rejection meds are being held.  After this she did well and all problems resolved and her creatinine trended down.  Patient was admitted to skilled nursing facility for OT/PT and is now ready to be discharged home.    Past Medical History:  Diagnosis Date  . Acute diastolic (congestive) heart failure ([place]) 08/12/2017  . AKI (acute kidney injury) ([place]) 11/04/2017  . Anemia   . Arthritis    HIP  . Asthma    as a child  . Atrial flutter [facility])    s/p aflutter ablation at [facility]  . Blood transfusion    never had a reaction to blood transfusion  . Cardiomyopathy Mar 2013   Mild, EF 50-55% by Mar 2013 ECHO, diast dysfxn II  . Closed fracture of right distal radius 10/18/2016  . CMV (cytomegalovirus infection) ([place]) 10/03/2015  . Constipation    takes Miralax daily  . Depression    takes Zoloft daily  . Distal radius fracture, right   . DVT of lower extremity, bilateral ([place]) 12/21/11   "they're there now; been there for 2 wks"  . Eczema   . ESRD (end stage renal disease) on dialysis [facility]) 09/2011   07-19-2015 had Kidney transplant at [facility]  . Fractures, stress    in both feet--6 OR 7 YRS AGO--RESOLVED  . Generalized edema [id number]  . Gout    doesn't require meds   . HCAP (healthcare-associated pneumonia)    09/10/11  . Hearing difficulty   . Hypercalcemia    09/10/11  . Hyperparathyroidism ([place]) 04/07/2013  . Hypertension  takes Diltiazem daily   . Hypomagnesemia 08/02/2015  . Hypophosphatemia 11/08/2017  . Immunosuppression ([place]) 07/25/2015  . Memory changes   . Morbid obesity ([place])   . Nonischemic dilated cardiomyopathy ([place])   . Obesity 04/07/2013  . Oligouria   . PAF (paroxysmal atrial fibrillation) ([facility])     HX OF CEREBRAL BLEED WHILE ON COUMADIN-SO PT NOT ON ANY BLOOD THINNERS NOW  . Peripheral vascular disease ([place])   . Presence of Watchman left atrial appendage closure device 10/04/2017  . Proteinuria 09/04/2016  . Psoriasis 08/07/2016  . Renal  transplant recipient 07/25/2015  . Sepsis ([place]) 11/08/2017  . Sleep apnea    uses CPAP nightly  . Stroke [facility]) 2009   denies residual, hemorrhagic now off coumadin  . Stroke due to intracerebral hemorrhage ([place]) 2009  . Suture reaction 09/04/2016    Past Surgical History:  Procedure Laterality Date  . AV FISTULA PLACEMENT  09/28/2011   Procedure: ARTERIOVENOUS (AV) FISTULA CREATION;  Surgeon: Rosetta Posner, MD;  Location: [place];  Service: Vascular;  Laterality: Left;  . CARDIAC VALVE SURGERY  1960    FOR ATRIAL SEPTAL DEFECT  . CHOLECYSTECTOMY     1980's  . COLONOSCOPY    . CYSTOSCOPY     many yrs ago  . FEMUR FRACTURE SURGERY  03/2011; 09/03/2011   right; "had 2, 2 wk apart in 2012; broke it again 08/2011 & had OR"  . I&D EXTREMITY  09/15/2011   Procedure: IRRIGATION AND DEBRIDEMENT EXTREMITY w REMOVAL OF HARDWARE;  Surgeon: [name], MD;  Location: [place];  Service: Orthopedics;  Laterality: Right;  . INCISION AND DRAINAGE HIP  12/21/2011   Procedure: IRRIGATION AND DEBRIDEMENT HIP;  Surgeon: [name], MD;  Location: [place];  Service: Orthopedics;  Laterality: Right;  I&D RIGHT HIP WITH PLACEMENT ANTIBIOTIC SPACER  . INSERTION OF DIALYSIS CATHETER     Procedure: INSERTION OF DIALYSIS CATHETER;  Surgeon: Rosetta Posner, MD;  Location: [place];  Service: Vascular;  Laterality: Right;  . IRRIGATION AND DEBRIDEMENT ABSCESS  12/21/11   right hip  . JOINT REPLACEMENT    . KIDNEY TRANSPLANT  07/19/15  . LAPAROSCOPIC GASTRIC BANDING    . OPEN REDUCTION INTERNAL FIXATION (ORIF) DISTAL RADIAL FRACTURE Right 10/18/2016   Procedure: OPEN REDUCTION INTERNAL FIXATION (ORIF) RIGHT DISTAL RADIAL FRACTURE;  Surgeon: Marchia Bond, MD;  Location: [place];  Service: Orthopedics;  Laterality: Right;  . TOTAL HIP ARTHROPLASTY  02/2011   right THA 02/2011, I&D/removal of hardware 09/2011,, repeat I&D Jun 2013, reimplantation R THA 03-26-2012  . TOTAL HIP REVISION  03/25/2012   Procedure: TOTAL  HIP REVISION;  Surgeon: [name], MD;  Location: [facility];  Service: Orthopedics;  Laterality: Right;  Right Total Hip Reimplantation  . VENA CAVA FILTER PLACEMENT  11/2011     reports that she has never smoked. She has never used smokeless tobacco. She reports that she does not drink alcohol or use drugs. Social History   Socioeconomic History  . Marital status: Married    Spouse name: Not on file  . Number of children: Not on file  . Years of education: Not on file  . Highest education level: Not on file  Occupational History  . Occupation: retired  Scientific laboratory technician  . Financial resource strain: Not on file  . Food insecurity:    Worry: Not on file    Inability: Not on file  . Transportation needs:  Medical: Not on file    Non-medical: Not on file  Tobacco Use  . Smoking status: Never Smoker  . Smokeless tobacco: Never Used  Substance and Sexual Activity  . Alcohol use: No  . Drug use: No  . Sexual activity: Not Currently    Birth control/protection: Post-menopausal  Lifestyle  . Physical activity:    Days per week: Not on file    Minutes per session: Not on file  . Stress: Not on file  Relationships  . Social connections:    Talks on phone: Not on file    Gets together: Not on file    Attends religious service: Not on file    Active member of club or organization: Not on file    Attends meetings of clubs or organizations: Not on file    Relationship status: Not on file  . Intimate partner violence:    Fear of current or ex partner: Not on file    Emotionally abused: Not on file    Physically abused: Not on file    Forced sexual activity: Not on file  Other Topics Concern  . Not on file  Social History Narrative   Married; retired; lives in [place] Maintenance Due  Topic Date Due  . COLONOSCOPY  07/24/2017  . INFLUENZA VACCINE  02/13/2018  . MAMMOGRAM  05/15/2019  . DEXA SCAN  Completed  . PNA vac Low Risk Adult  Completed     Medications: Allergies as of 12/04/2017      Reactions   Ace Inhibitors Other (See Comments)   Reaction unknown   Warfarin Sodium Other (See Comments)   Caused her to have a stroke      Medication List        Accurate as of 12/04/17  1:35 PM. Always use your most recent med list.          acetaminophen 500 MG tablet Commonly known as:  TYLENOL Take 500 mg by mouth every 6 (six) hours as needed.   albuterol 108 (90 Base) MCG/ACT inhaler Commonly known as:  PROVENTIL HFA;VENTOLIN HFA Inhale 2 puffs into the lungs every 6 (six) hours as needed for wheezing or shortness of breath.   amiodarone 200 MG tablet Commonly known as:  PACERONE Take 200 mg by mouth daily.   aspirin EC 81 MG tablet Take 81 mg by mouth.   budesonide-formoterol 160-4.5 MCG/ACT inhaler Commonly known as:  SYMBICORT Inhale 2 puffs into the lungs 2 (two) times daily.   carvedilol 12.5 MG tablet Commonly known as:  COREG Take 12.5 mg by mouth 2 (two) times daily with a meal.   docusate sodium 100 MG capsule Commonly known as:  COLACE Take 100 mg by mouth daily as needed (For constipation.).   furosemide 80 MG tablet Commonly known as:  LASIX Take 80 mg by mouth 2 (two) times daily.   MYFORTIC 180 MG EC tablet Generic drug:  mycophenolate Take 180 mg by mouth 2 (two) times daily.   potassium chloride 20 MEQ packet Commonly known as:  KLOR-CON Take 20 mEq by mouth daily.   predniSONE 5 MG tablet Commonly known as:  DELTASONE Take 5 mg by mouth daily with breakfast.   rivaroxaban 20 MG Tabs tablet Commonly known as:  XARELTO Take 1 tablet (20 mg total) by mouth daily.   sertraline 50 MG tablet Commonly known as:  ZOLOFT Take 1 tablet (50 mg total) by mouth daily.   tacrolimus 0.5  MG capsule Commonly known as:  PROGRAF Take 0.5 mg by mouth 2 (two) times daily.        Vitals:   12/04/17 0922  BP: 135/90  Pulse: 100  Resp: 18  Temp: (!) 97.5 F (36.4 C)  TempSrc: Oral   Weight: 221 lb (100.2 kg)  Height: 5\' 5"  (1.651 m)   Body mass index is 36.78 kg/m.  Physical Exam  GENERAL APPEARANCE: Alert, conversant. No acute distress.  HEENT: Unremarkable. RESPIRATORY: Breathing is even, unlabored. Lung sounds are clear   CARDIOVASCULAR: Heart RRR no murmurs, rubs or gallops. No peripheral edema.  GASTROINTESTINAL: Abdomen is soft, non-tender, not distended w/ normal bowel sounds.  NEUROLOGIC: Cranial nerves 2-12 grossly intact. Moves all extremities   Labs reviewed: Basic Metabolic Panel: Recent Labs    11/03/17 1805  NA 138  K 3.2*  CL 97*  CO2 28  GLUCOSE 136*  BUN 52*  CREATININE 3.47*  CALCIUM 9.0   No results found for: [facility] Liver Function Tests: Recent Labs    11/03/17 1805  AST 17  ALT 20  ALKPHOS 81  BILITOT 1.4*  PROT 6.4*  ALBUMIN 2.6*   Recent Labs    11/03/17 1805  LIPASE 30   No results for input(s): AMMONIA in the last 8760 hours. CBC: Recent Labs    11/03/17 1805  WBC 32.7*  HGB 8.7*  HCT 26.8*  MCV 77.0*  PLT 259   Lipid No results for input(s): CHOL, HDL, LDLCALC, TRIG in the last 8760 hours. Cardiac Enzymes: Recent Labs    11/03/17 2118  TROPONINI 0.09*   BNP: Recent Labs    11/03/17 1805  BNP 525.4*   CBG: No results for input(s): GLUCAP in the last 8760 hours.  Procedures and Imaging Studies During Stay: No results found.  Assessment/Plan:   Septic shock ([place])  Acute cystitis with hematuria  Acute renal failure, unspecified acute renal failure type (HCC)  Acute diastolic (congestive) heart failure (HCC)  Typical atrial flutter (HCC)  OSA on CPAP  Depression, unspecified depression type  History of DVT (deep vein thrombosis)   Patient is being discharged with the following home health services: OT/PT/nursing  Patient is being discharged with the following durable medical equipment: None  Patient has been advised to f/u with their PCP in 1-2 weeks to bring them up to  date on their rehab stay.  Social services at facility was responsible for arranging this appointment.  Pt was provided with a 30 day supply of prescriptions for medications and refills must be obtained from their PCP.  For controlled substances, a more limited supply may be provided adequate until PCP appointment only.  Medications have been reconciled.  Time spent greater than 30 minutes;> 50% of time with patient was spent reviewing records, labs, tests and studies, counseling and developing plan of care  Noah Delaine. Sheppard Coil, M.D.

## 2017-12-05 ENCOUNTER — Ambulatory Visit (INDEPENDENT_AMBULATORY_CARE_PROVIDER_SITE_OTHER)
Admission: RE | Admit: 2017-12-05 | Discharge: 2017-12-05 | Disposition: A | Discharge status: Home / Self Care | Payer: Medicare Other | Source: Ambulatory Visit | Attending: Pulmonary Disease | Admitting: Pulmonary Disease

## 2017-12-05 DIAGNOSIS — J8410 Pulmonary fibrosis, unspecified: Secondary | ICD-10-CM | POA: Diagnosis not present

## 2017-12-05 DIAGNOSIS — R0600 Dyspnea, unspecified: Secondary | ICD-10-CM | POA: Diagnosis not present

## 2017-12-05 IMAGING — CT CT CHEST HIGH RESOLUTION W/O CM
2 of 5 series · 14 of 36 positions shown, 17 images · non-contrast
Comparison: [DATE] chest radiograph.

CLINICAL DATA: Worsening dyspnea and wheezing for several months.

EXAM:
CT CHEST WITHOUT CONTRAST
TECHNIQUE: Multidetector CT imaging of the chest was performed following the
standard protocol without intravenous contrast. High resolution
imaging of the lungs, as well as inspiratory and expiratory imaging,
was performed.

[Series 2: high resolution · axial · 0.61mm/px · z∈[-256,-28]mm · 11 of 127 slices shown, 14 images]
[im 7/127  mediastinal]
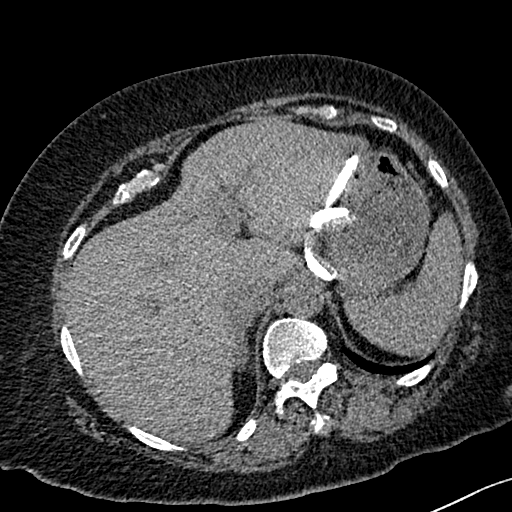
[im 7/127  lung]
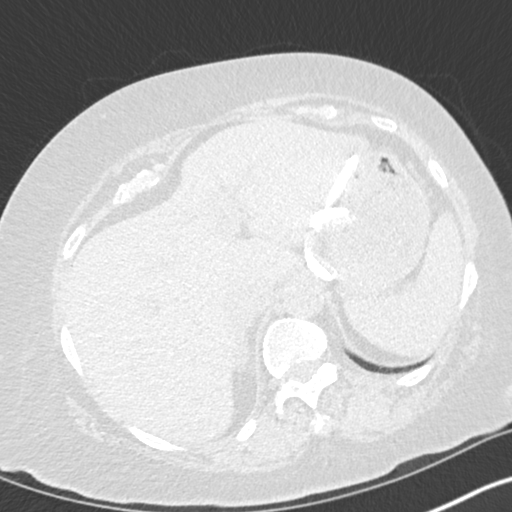
[im 19/127  lung]
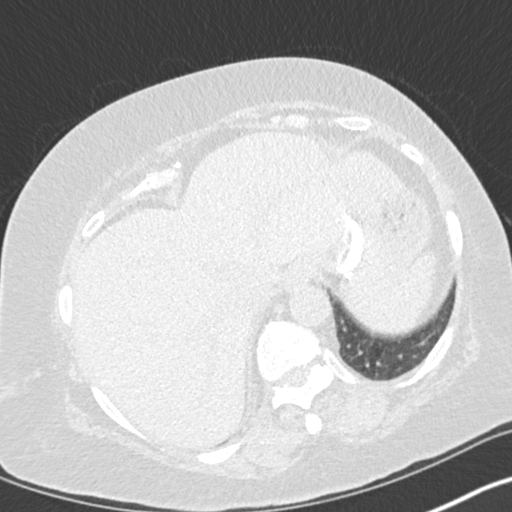
[im 31/127  lung]
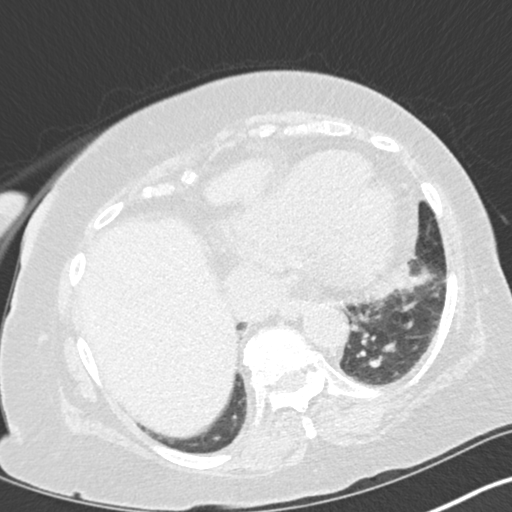
[im 43/127  lung]
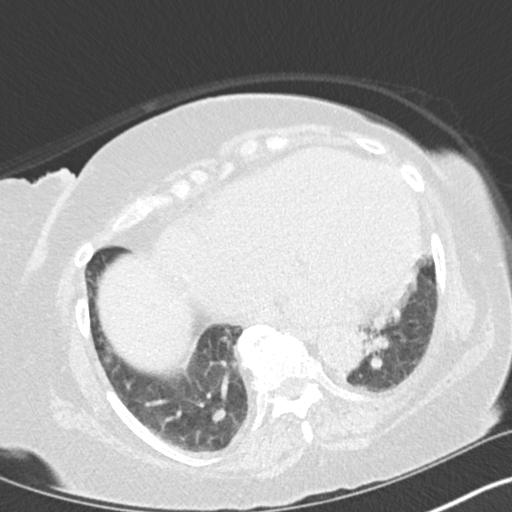
[im 55/127  mediastinal]
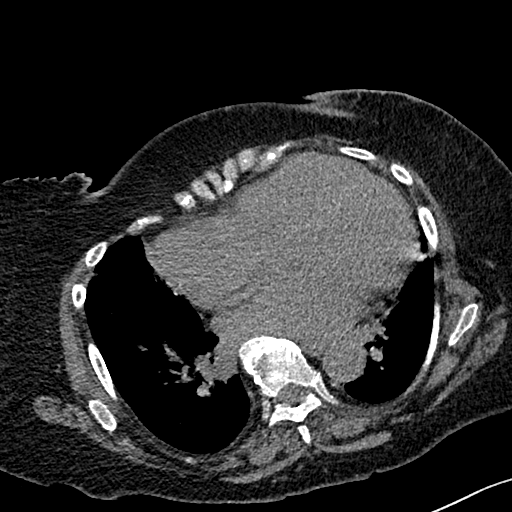
[im 55/127  lung]
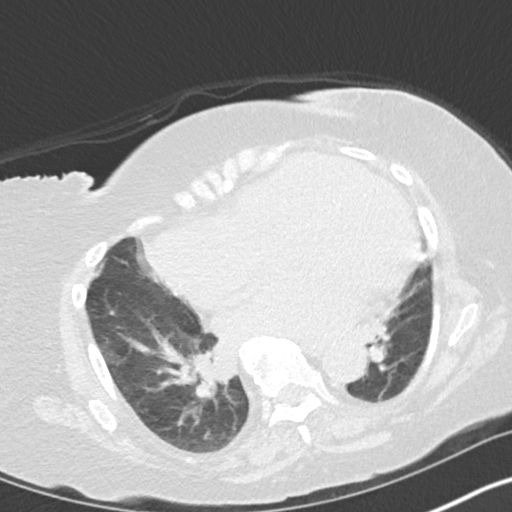
[im 67/127  lung]
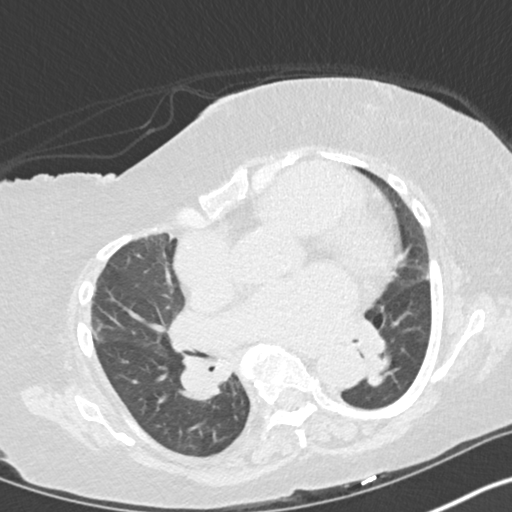
[im 73/127  lung]
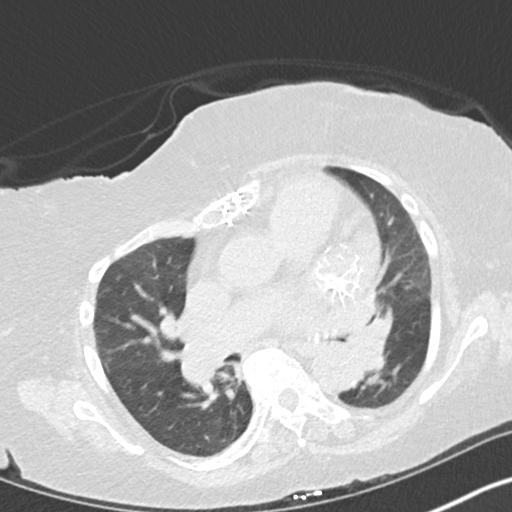
[im 85/127  lung]
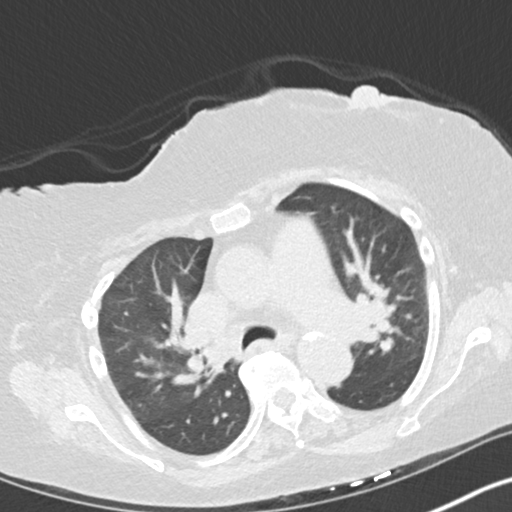
[im 97/127  mediastinal]
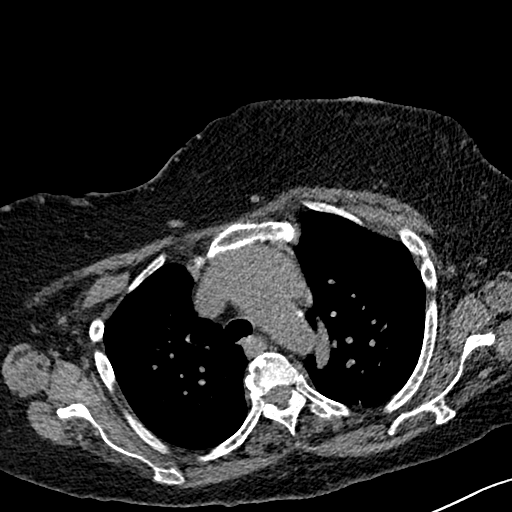
[im 97/127  lung]
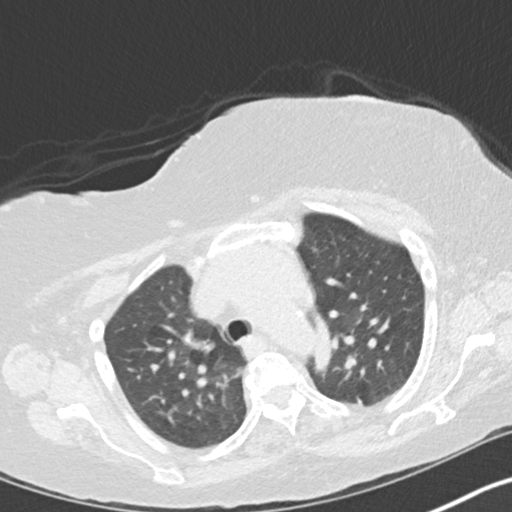
[im 109/127  lung]
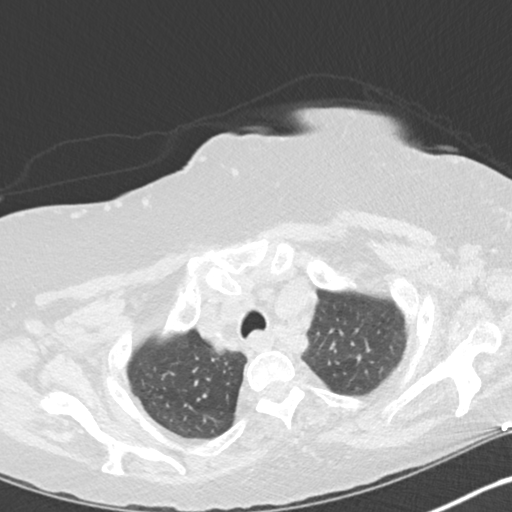
[im 121/127  lung]
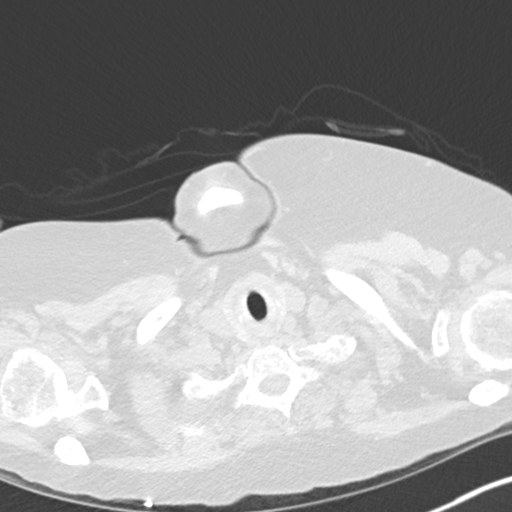

[Series 8: coronal · coronal · 0.50mm/px · 3 of 117 slices shown]
[im 24/117  lung]
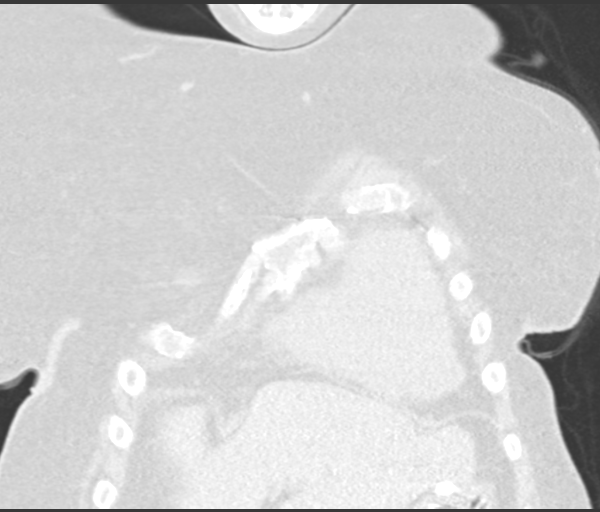
[im 47/117  lung]
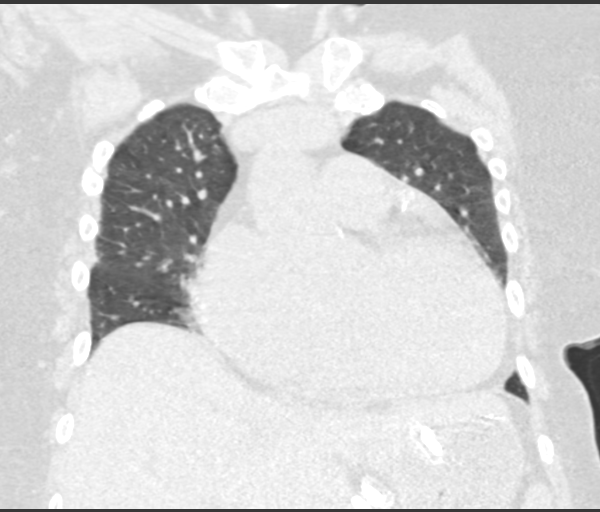
[im 70/117  lung]
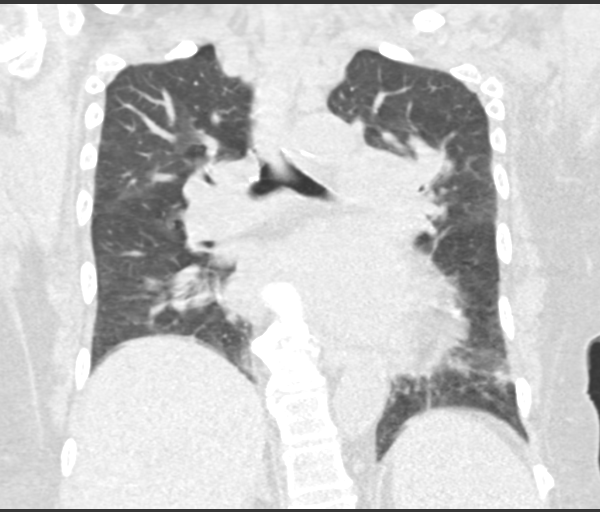

[14 of 36 positions shown; findings below may reference images not displayed]

FINDINGS: Cardiovascular: Moderate cardiomegaly. No significant pericardial
effusion/thickening. Left atrial appendage closure device is in
place. Atherosclerotic nonaneurysmal thoracic aorta. Borderline
prominent main pulmonary artery (3.3 cm diameter).

Mediastinum/Nodes: Hypodense 1.2 cm left thyroid lobe nodule.
Unremarkable esophagus. No pathologically enlarged axillary,
mediastinal or hilar lymph nodes, noting limited sensitivity for the
detection of hilar adenopathy on this noncontrast study.

Lungs/Pleura: No pneumothorax. No pleural effusion. No acute
consolidative airspace disease, lung masses or significant pulmonary
nodules. There is a mosaic attenuation throughout both lungs, which
appears to be due to moderate air trapping on the limited expiration
sequence. There is a suggestion of bilateral bronchomalacia without
tracheomalacia. Very mild patchy subpleural reticulation and
ground-glass attenuation in both lungs without significant regions
of traction bronchiectasis or architectural distortion. No frank
honeycombing.

Upper abdomen: Partially visualized laparoscopic gastric band
appears appropriately positioned in gastric cardia.

Musculoskeletal: No aggressive appearing focal osseous lesions.
Intact sternotomy wires. Moderate thoracic spondylosis.
IMPRESSION: 1. Very mild patchy subpleural reticulation and ground-glass
attenuation in both lungs without significant traction
bronchiectasis or frank honeycombing. These findings are nonspecific
and may simply represent hypoventilatory change and/or nonspecific
mild postinfectious/postinflammatory scarring. The possibility of an
interstitial lung disease such as mild nonspecific interstitial
pneumonia (NSIP) or early usual interstitial pneumonia (UIP) cannot
be excluded, but is not favored. A follow-up high-resolution chest
CT study may be obtained in 12 months to assess temporal pattern
stability, as clinically warranted.
2. Mosaic attenuation throughout both lungs, favored to be due to
moderate air trapping/small airways disease on the basis of the
limited expiration sequence.
3. Suggestion of bilateral bronchomalacia without tracheomalacia.
4. Moderate cardiomegaly.

Aortic Atherosclerosis ([RT]-[RT]).

## 2017-12-06 ENCOUNTER — Telehealth: Payer: Self-pay | Admitting: Pulmonary Disease

## 2017-12-06 DIAGNOSIS — J45901 Unspecified asthma with (acute) exacerbation: Secondary | ICD-10-CM

## 2017-12-06 NOTE — Telephone Encounter (Signed)
Called and let Patient know that we did not have CT results at this time.  Patient stated that she is not using her O2 during the day or night.  I informed her that I would let Dr Elsworth Soho know she is know longer using her oxygen and that she would like to send it back.  RA please advise

## 2017-12-07 DIAGNOSIS — I480 Paroxysmal atrial fibrillation: Secondary | ICD-10-CM | POA: Diagnosis not present

## 2017-12-07 DIAGNOSIS — I420 Dilated cardiomyopathy: Secondary | ICD-10-CM | POA: Diagnosis not present

## 2017-12-07 DIAGNOSIS — I132 Hypertensive heart and chronic kidney disease with heart failure and with stage 5 chronic kidney disease, or end stage renal disease: Secondary | ICD-10-CM | POA: Diagnosis not present

## 2017-12-07 DIAGNOSIS — N186 End stage renal disease: Secondary | ICD-10-CM | POA: Diagnosis not present

## 2017-12-07 DIAGNOSIS — I5030 Unspecified diastolic (congestive) heart failure: Secondary | ICD-10-CM | POA: Diagnosis not present

## 2017-12-07 DIAGNOSIS — E1122 Type 2 diabetes mellitus with diabetic chronic kidney disease: Secondary | ICD-10-CM | POA: Diagnosis not present

## 2017-12-08 DIAGNOSIS — I132 Hypertensive heart and chronic kidney disease with heart failure and with stage 5 chronic kidney disease, or end stage renal disease: Secondary | ICD-10-CM | POA: Diagnosis not present

## 2017-12-08 DIAGNOSIS — I420 Dilated cardiomyopathy: Secondary | ICD-10-CM | POA: Diagnosis not present

## 2017-12-08 DIAGNOSIS — I5030 Unspecified diastolic (congestive) heart failure: Secondary | ICD-10-CM | POA: Diagnosis not present

## 2017-12-08 DIAGNOSIS — E1122 Type 2 diabetes mellitus with diabetic chronic kidney disease: Secondary | ICD-10-CM | POA: Diagnosis not present

## 2017-12-08 DIAGNOSIS — I480 Paroxysmal atrial fibrillation: Secondary | ICD-10-CM | POA: Diagnosis not present

## 2017-12-08 DIAGNOSIS — N186 End stage renal disease: Secondary | ICD-10-CM | POA: Diagnosis not present

## 2017-12-10 ENCOUNTER — Ambulatory Visit (INDEPENDENT_AMBULATORY_CARE_PROVIDER_SITE_OTHER): Payer: Medicare Other | Admitting: Family Medicine

## 2017-12-10 ENCOUNTER — Encounter: Payer: Self-pay | Admitting: Family Medicine

## 2017-12-10 VITALS — BP 105/69 | HR 100 | Temp 98.2°F | Resp 16 | Ht 65.0 in | Wt 218.0 lb

## 2017-12-10 DIAGNOSIS — R600 Localized edema: Secondary | ICD-10-CM | POA: Diagnosis not present

## 2017-12-10 DIAGNOSIS — A419 Sepsis, unspecified organism: Secondary | ICD-10-CM | POA: Diagnosis not present

## 2017-12-10 DIAGNOSIS — N3001 Acute cystitis with hematuria: Secondary | ICD-10-CM

## 2017-12-10 DIAGNOSIS — I619 Nontraumatic intracerebral hemorrhage, unspecified: Secondary | ICD-10-CM | POA: Diagnosis not present

## 2017-12-10 DIAGNOSIS — N390 Urinary tract infection, site not specified: Secondary | ICD-10-CM | POA: Diagnosis not present

## 2017-12-10 DIAGNOSIS — I10 Essential (primary) hypertension: Secondary | ICD-10-CM | POA: Diagnosis not present

## 2017-12-10 DIAGNOSIS — Z940 Kidney transplant status: Secondary | ICD-10-CM | POA: Diagnosis not present

## 2017-12-10 LAB — POC URINALSYSI DIPSTICK (AUTOMATED)
Bilirubin, UA: NEGATIVE
Glucose, UA: NEGATIVE
Ketones, UA: NEGATIVE
Nitrite, UA: NEGATIVE
Protein, UA: POSITIVE — AB
Spec Grav, UA: 1.015 (ref 1.010–1.025)
Urobilinogen, UA: 0.2 E.U./dL
pH, UA: 6 (ref 5.0–8.0)

## 2017-12-10 MED ORDER — NONFORMULARY OR COMPOUNDED ITEM: 0 refills | Status: DC

## 2017-12-10 NOTE — Patient Instructions (Signed)

## 2017-12-10 NOTE — Assessment & Plan Note (Signed)
Per [facility]

## 2017-12-10 NOTE — Telephone Encounter (Signed)
Due to possible interstitial lung disease, please have her stop her Amio and refer to Afib clinic for further recommendations on AAD therapy

## 2017-12-10 NOTE — Assessment & Plan Note (Signed)
Elevate legs Pt is on lasix Compression stockings

## 2017-12-10 NOTE — Assessment & Plan Note (Signed)
Urine sent for culture today Pt on abx for prevention per nephrology-- she will call us with abx Consider urology

## 2017-12-10 NOTE — Telephone Encounter (Signed)
Okay to discontinue oxygen CT chest showed faint markings suggestive of interstitial lung disease She should discuss with her cardiologist about stopping amiodarone/changing to different medication if possible

## 2017-12-10 NOTE — Progress Notes (Signed)
Patient ID: [name], female    DOB: May 17, 1946  Age: 72 y.o. MRN: [id number]    Subjective:  Subjective  HPI [name] presents for hospital f/u for sepsis due to UTI -- pt was put on daily abx by nephrology per pt  Pt is feeling better.  She presented to [facility] ER with NVD and was found to have uti / sepsis.  She was transferred to [facility] due to concern of tranplant failure.  Once [name] infection resolved cR came down   Pt still feels weak.  She has PT and ot coming to the house.  She has f/u nephrology, cardiology as well.   Review of Systems  Constitutional: Negative for activity change, appetite change and fatigue.  HENT: Negative for hearing loss, congestion, tinnitus and ear discharge.  dentist q71m Eyes: Negative for visual disturbance (see optho q1y -- vision corrected to 20/20 with glasses).  Respiratory: Negative for cough, chest tightness and shortness of breath.   Cardiovascular: Negative for chest pain, palpitations + low ext swelling  Gastrointestinal: Negative for abdominal pain, diarrhea, constipation and abdominal distention.  Genitourinary: Negative for urgency, frequency, decreased urine volume and difficulty urinating.  Musculoskeletal: Negative for back pain, arthralgias and gait problem.  Skin: Negative for color change, pallor and rash.  Neurological: Negative for dizziness, light-headedness, numbness and headaches.  Hematological: Negative for adenopathy. Does not bruise/bleed easily.  Psychiatric/Behavioral: Negative for suicidal ideas, confusion, sleep disturbance, self-injury, dysphoric mood, decreased concentration and agitation.      History Past Medical History:  Diagnosis Date  . Acute diastolic (congestive) heart failure ([place]) 08/12/2017  . AKI (acute kidney injury) ([place]) 11/04/2017  . Anemia   . Arthritis    HIP  . Asthma    as a child  . Atrial flutter [facility])    s/p aflutter ablation at [facility]  . Blood transfusion    never had a reaction to  blood transfusion  . Cardiomyopathy Mar 2013   Mild, EF 50-55% by Mar 2013 ECHO, diast dysfxn II  . Closed fracture of right distal radius 10/18/2016  . CMV (cytomegalovirus infection) ([place]) 10/03/2015  . Constipation    takes Miralax daily  . Depression    takes Zoloft daily  . Distal radius fracture, right   . DVT of lower extremity, bilateral ([place]) 12/21/11   "they're there now; been there for 2 wks"  . Eczema   . ESRD (end stage renal disease) on dialysis [facility]) 09/2011   07-19-2015 had Kidney transplant at [facility]  . Fractures, stress    in both feet--6 OR 7 YRS AGO--RESOLVED  . Generalized edema [id number]  . Gout    doesn't require meds   . HCAP (healthcare-associated pneumonia)    09/10/11  . Hearing difficulty   . Hypercalcemia    09/10/11  . Hyperparathyroidism ([place]) 04/07/2013  . Hypertension    takes Diltiazem daily   . Hypomagnesemia 08/02/2015  . Hypophosphatemia 11/08/2017  . Immunosuppression ([place]) 07/25/2015  . Memory changes   . Morbid obesity ([place])   . Nonischemic dilated cardiomyopathy ([place])   . Obesity 04/07/2013  . Oligouria   . PAF (paroxysmal atrial fibrillation) ([facility])     HX OF CEREBRAL BLEED WHILE ON COUMADIN-SO PT NOT ON ANY BLOOD THINNERS NOW  . Peripheral vascular disease ([place])   . Presence of Watchman left atrial appendage closure device 10/04/2017  . Proteinuria 09/04/2016  . Psoriasis 08/07/2016  . 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 Sepsis ([place]) 11/08/2017  .  Sleep apnea    uses CPAP nightly  . Stroke [facility]) 2009   denies residual, hemorrhagic now off coumadin  . 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 Suture reaction 09/04/2016    She has a past surgical history that includes Cardiac valuve replacement (1960); I&D extremity (09/15/2011); AV fistula placement (09/28/2011); Cystoscopy; Colonoscopy; Irrigation and debridement abscess (12/21/11); Vena cava filter placement (11/2011); Total hip arthroplasty (02/2011); Femur fracture  surgery (03/2011; 09/03/2011); Insertion of dialysis catheter; Cholecystectomy; Incision and drainage hip (12/21/2011); Total hip revision (03/25/2012); Kidney transplant (07/19/15); Joint replacement; Laparoscopic gastric banding; and Open reduction internal fixation (orif) distal radial fracture (Right, 10/18/2016).   [name] family history includes Arthritis in [name] mother; Cancer in [name] father; Diabetes in [name] mother; Hodgkin's lymphoma (age of onset: 32) in [name] unknown relative; Hypertension in [name] brother and mother.She reports that she has never smoked. She has never used smokeless tobacco. She reports that she does not drink alcohol or use drugs.  Current Outpatient Medications on File Prior to Visit  Medication Sig Dispense Refill  . acetaminophen (TYLENOL) 500 MG tablet Take 500 mg by mouth every 6 (six) hours as needed.    Marland Kitchen albuterol (PROVENTIL HFA;VENTOLIN HFA) 108 (90 Base) MCG/ACT inhaler Inhale 2 puffs into the lungs every 6 (six) hours as needed for wheezing or shortness of breath. 18 g 5  . amiodarone (PACERONE) 200 MG tablet Take 200 mg by mouth daily.  4  . aspirin EC 81 MG tablet Take 81 mg by mouth.    . budesonide-formoterol (SYMBICORT) 160-4.5 MCG/ACT inhaler Inhale 2 puffs into the lungs 2 (two) times daily. 1 Inhaler 0  . carvedilol (COREG) 12.5 MG tablet Take 12.5 mg by mouth 2 (two) times daily with a meal.    . ciprofloxacin (CIPRO) 500 MG tablet Take 500 mg by mouth 2 (two) times daily.  0  . docusate sodium (COLACE) 100 MG capsule Take 100 mg by mouth daily as needed (For constipation.).     Marland Kitchen furosemide (LASIX) 80 MG tablet Take 80 mg by mouth 2 (two) times daily.    . mycophenolate (MYFORTIC) 180 MG EC tablet Take 180 mg by mouth 2 (two) times daily.    . potassium chloride (KLOR-CON) 20 MEQ packet Take 20 mEq by mouth daily.    . predniSONE (DELTASONE) 5 MG tablet Take 5 mg by mouth daily with breakfast.    . rivaroxaban (XARELTO) 20 MG TABS tablet Take 1 tablet (20 mg total) by  mouth daily. 30 tablet   . sertraline (ZOLOFT) 50 MG tablet Take 1 tablet (50 mg total) by mouth daily. 90 tablet 3  . tacrolimus (PROGRAF) 0.5 MG capsule Take 0.5 mg by mouth 2 (two) times daily.     No current facility-administered medications on file prior to visit.      Objective:  Objective  Physical Exam  Constitutional: She is oriented to person, place, and time. She appears well-developed and well-nourished.  HENT:  Head: Normocephalic and atraumatic.  Eyes: Conjunctivae and EOM are normal.  Neck: Normal range of motion. Neck supple. No JVD present. Carotid bruit is not present. No thyromegaly present.  Cardiovascular: Normal rate, regular rhythm and normal heart sounds.  No murmur heard. Pulmonary/Chest: Effort normal and breath sounds normal. No respiratory distress. She has no wheezes. She has no rales. She exhibits no tenderness.  Musculoskeletal: She exhibits edema.  +2 pitting edema B/L feet / ankles   Neurological: She is alert and oriented to  person, place, and time.  Psychiatric: She has a normal mood and affect.  Nursing note and vitals reviewed.  BP 105/69 (BP Location: Right Arm, Cuff Size: Large)   Pulse 100   Temp 98.2 F (36.8 C) (Oral)   Resp 16   Ht 5\' 5"  (1.651 m)   Wt 218 lb (98.9 kg)   SpO2 97%   BMI 36.28 kg/m  Wt Readings from Last 3 Encounters:  12/10/17 218 lb (98.9 kg)  12/04/17 221 lb (100.2 kg)  12/02/17 220 lb (99.8 kg)     Lab Results  Component Value Date   WBC 32.7 (H) 11/03/2017   HGB 8.7 (L) 11/03/2017   HCT 26.8 (L) 11/03/2017   PLT 259 11/03/2017   GLUCOSE 136 (H) 11/03/2017   CHOL  11/16/2008    184        ATP III CLASSIFICATION:  <200     mg/dL   Desirable  200-239  mg/dL   Borderline High  >=240    mg/dL   High          TRIG 157 (H) 09/15/2011   HDL 33 (L) 11/16/2008   LDLCALC (H) 11/16/2008    130        Total Cholesterol/HDL:CHD Risk Coronary Heart Disease Risk Table                     Men   Women  1/2  Average Risk   3.4   3.3  Average Risk       5.0   4.4  2 X Average Risk   9.6   7.1  3 X Average Risk  23.4   11.0        Use the calculated Patient Ratio above and the CHD Risk Table to determine the patient's CHD Risk.        ATP III CLASSIFICATION (LDL):  <100     mg/dL   Optimal  100-129  mg/dL   Near or Above                    Optimal  130-159  mg/dL   Borderline  160-189  mg/dL   High  >190     mg/dL   Very High   ALT 20 11/03/2017   AST 17 11/03/2017   NA 138 11/03/2017   K 3.2 (L) 11/03/2017   CL 97 (L) 11/03/2017   CREATININE 3.47 (H) 11/03/2017   BUN 52 (H) 11/03/2017   CO2 28 11/03/2017   TSH 2.655 09/11/2011   INR 1.90 11/03/2017   HGBA1C  11/16/2008    5.5 (NOTE) The ADA recommends the following therapeutic goal for glycemic control related to Hgb A1c measurement: Goal of therapy: <6.5 Hgb A1c  Reference: American Diabetes Association: Clinical Practice Recommendations 2010, Diabetes Care, 2010, 33: (Suppl  1).    Ct Chest High Resolution  Result Date: 12/05/2017 CLINICAL DATA:  Worsening dyspnea and wheezing for several months. EXAM: CT CHEST WITHOUT CONTRAST TECHNIQUE: Multidetector CT imaging of the chest was performed following the standard protocol without intravenous contrast. High resolution imaging of the lungs, as well as inspiratory and expiratory imaging, was performed. COMPARISON:  11/03/2017 chest radiograph. FINDINGS: Cardiovascular: Moderate cardiomegaly. No significant pericardial effusion/thickening. Left atrial appendage closure device is in place. Atherosclerotic nonaneurysmal thoracic aorta. Borderline prominent main pulmonary artery (3.3 cm diameter). Mediastinum/Nodes: Hypodense 1.2 cm left thyroid lobe nodule. Unremarkable esophagus. No pathologically enlarged axillary, mediastinal or hilar lymph nodes, noting  limited sensitivity for the detection of hilar adenopathy on this noncontrast study. Lungs/Pleura: No pneumothorax. No pleural effusion.  No acute consolidative airspace disease, lung masses or significant pulmonary nodules. There is a mosaic attenuation throughout both lungs, which appears to be due to moderate air trapping on the limited expiration sequence. There is a suggestion of bilateral bronchomalacia without tracheomalacia. Very mild patchy subpleural reticulation and ground-glass attenuation in both lungs without significant regions of traction bronchiectasis or architectural distortion. No frank honeycombing. Upper abdomen: Partially visualized laparoscopic gastric band appears appropriately positioned in gastric cardia. Musculoskeletal: No aggressive appearing focal osseous lesions. Intact sternotomy wires. Moderate thoracic spondylosis. IMPRESSION: 1. Very mild patchy subpleural reticulation and ground-glass attenuation in both lungs without significant traction bronchiectasis or frank honeycombing. These findings are nonspecific and may simply represent hypoventilatory change and/or nonspecific mild postinfectious/postinflammatory scarring. The possibility of an interstitial lung disease such as mild nonspecific interstitial pneumonia (NSIP) or early usual interstitial pneumonia (UIP) cannot be excluded, but is not favored. A follow-up high-resolution chest CT study may be obtained in 12 months to assess temporal pattern stability, as clinically warranted. 2. Mosaic attenuation throughout both lungs, favored to be due to moderate air trapping/small airways disease on the basis of the limited expiration sequence. 3. Suggestion of bilateral bronchomalacia without tracheomalacia. 4. Moderate cardiomegaly. Aortic Atherosclerosis (ICD10-I70.0). Electronically Signed   By: Ilona Sorrel M.D.   On: 12/05/2017 14:15     Assessment & Plan:  Plan  I am having [name] start on NONFORMULARY OR COMPOUNDED ITEM. I am also having [name] maintain [name] docusate sodium, aspirin EC, mycophenolate, sertraline, albuterol, budesonide-formoterol,  amiodarone, predniSONE, rivaroxaban, tacrolimus, carvedilol, acetaminophen, potassium chloride, furosemide, and ciprofloxacin.  Meds ordered this encounter  Medications  . NONFORMULARY OR COMPOUNDED ITEM    Sig: Compression stockings --thigh high  20-30 mm hg Dx  Low ext edema    Dispense:  1 each    Refill:  0    Problem List Items Addressed This Visit      Unprioritized   Deceased-donor kidney transplant recipient    Per [place]       Hypertension    Well controlled, no changes to meds. Encouraged heart healthy diet such as the DASH diet and exercise as tolerated.       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  [name], DO

## 2017-12-10 NOTE — Assessment & Plan Note (Signed)
On xaralto 

## 2017-12-10 NOTE — Assessment & Plan Note (Signed)
Well controlled, no changes to meds. Encouraged heart healthy diet such as the DASH diet and exercise as tolerated.  °

## 2017-12-10 NOTE — Telephone Encounter (Signed)
Called and spoke to patient. Relayed results per RA. Patient verbalized understanding. Let patient know we could place order for oxygen to be discontinued. Patient thanked staff. No questions at this time. Nothing further needed.

## 2017-12-11 DIAGNOSIS — I132 Hypertensive heart and chronic kidney disease with heart failure and with stage 5 chronic kidney disease, or end stage renal disease: Secondary | ICD-10-CM | POA: Diagnosis not present

## 2017-12-11 DIAGNOSIS — I480 Paroxysmal atrial fibrillation: Secondary | ICD-10-CM | POA: Diagnosis not present

## 2017-12-11 DIAGNOSIS — N186 End stage renal disease: Secondary | ICD-10-CM | POA: Diagnosis not present

## 2017-12-11 DIAGNOSIS — I420 Dilated cardiomyopathy: Secondary | ICD-10-CM | POA: Diagnosis not present

## 2017-12-11 DIAGNOSIS — E1122 Type 2 diabetes mellitus with diabetic chronic kidney disease: Secondary | ICD-10-CM | POA: Diagnosis not present

## 2017-12-11 DIAGNOSIS — I5030 Unspecified diastolic (congestive) heart failure: Secondary | ICD-10-CM | POA: Diagnosis not present

## 2017-12-11 LAB — URINE CULTURE
MICRO NUMBER:: 90640228
SPECIMEN QUALITY:: ADEQUATE

## 2017-12-11 NOTE — Telephone Encounter (Signed)
I spoke with patient, instructed her to stop Amiodarone due to Ct scan results and follow up in afib clinic.   I spoke with [name] at [facility] clinic. Patient is scheduled on Monday 12/16/17 at 10:30 (first available). Patient made aware of appt.  Informed patient to monitor HR and if she develops any symptoms to call office. She stated understanding and thankful for the call

## 2017-12-12 ENCOUNTER — Telehealth: Payer: Self-pay | Admitting: *Deleted

## 2017-12-12 NOTE — Telephone Encounter (Signed)
Received Physician Orders from [facility]; forwarded to provider/SLS 05/30

## 2017-12-13 ENCOUNTER — Encounter: Payer: Self-pay | Admitting: Family Medicine

## 2017-12-13 ENCOUNTER — Ambulatory Visit (INDEPENDENT_AMBULATORY_CARE_PROVIDER_SITE_OTHER): Payer: Medicare Other | Admitting: Family Medicine

## 2017-12-13 VITALS — BP 123/79 | HR 99 | Temp 98.1°F | Resp 16

## 2017-12-13 DIAGNOSIS — I480 Paroxysmal atrial fibrillation: Secondary | ICD-10-CM | POA: Diagnosis not present

## 2017-12-13 DIAGNOSIS — E1122 Type 2 diabetes mellitus with diabetic chronic kidney disease: Secondary | ICD-10-CM | POA: Diagnosis not present

## 2017-12-13 DIAGNOSIS — L02415 Cutaneous abscess of right lower limb: Secondary | ICD-10-CM

## 2017-12-13 DIAGNOSIS — I5030 Unspecified diastolic (congestive) heart failure: Secondary | ICD-10-CM | POA: Diagnosis not present

## 2017-12-13 DIAGNOSIS — I132 Hypertensive heart and chronic kidney disease with heart failure and with stage 5 chronic kidney disease, or end stage renal disease: Secondary | ICD-10-CM | POA: Diagnosis not present

## 2017-12-13 DIAGNOSIS — R300 Dysuria: Secondary | ICD-10-CM

## 2017-12-13 DIAGNOSIS — N186 End stage renal disease: Secondary | ICD-10-CM | POA: Diagnosis not present

## 2017-12-13 DIAGNOSIS — I420 Dilated cardiomyopathy: Secondary | ICD-10-CM | POA: Diagnosis not present

## 2017-12-13 LAB — POC URINALSYSI DIPSTICK (AUTOMATED)
Bilirubin, UA: NEGATIVE
Blood, UA: NEGATIVE
Glucose, UA: NEGATIVE
Ketones, UA: NEGATIVE
Leukocytes, UA: NEGATIVE
Nitrite, UA: NEGATIVE
Protein, UA: NEGATIVE
Spec Grav, UA: >=1.03 — AB (ref 1.010–1.025)
Urobilinogen, UA: 0.2 E.U./dL
pH, UA: 6 (ref 5.0–8.0)

## 2017-12-13 MED ORDER — DOXYCYCLINE HYCLATE 100 MG PO TABS: 100.0000 mg | ORAL_TABLET | Freq: Two times a day (BID) | ORAL | 0 refills | Status: DC

## 2017-12-13 MED ORDER — CEFTRIAXONE SODIUM 1 G IJ SOLR
1.0000 g | Freq: Once | INTRAMUSCULAR | Status: AC
  Administered 2017-12-13: 1 g via INTRAMUSCULAR

## 2017-12-13 NOTE — Patient Instructions (Signed)

## 2017-12-13 NOTE — Progress Notes (Signed)
Subjective:  I acted as a Education administrator for [employer]. Yancey Flemings, [place]   Patient ID: [name], female    DOB: October 30, 1945, 72 y.o.   MRN: [id number]  Chief Complaint  Patient presents with  . Leg Pain    HPI  Patient is in today for knot on calf muscle or r leg with pain .  She noticed it a few days ago.  No cp, no sob.  Pt is taking her xaralto daily.  Patient Care Team: Carollee Herter, Alferd Apa, DO as PCP - General Sueanne Margarita, MD as Consulting Physician (Cardiology) Paralee Cancel, MD as Consulting Physician (Orthopedic Surgery) Fleet Contras, MD as Consulting Physician (Nephrology) Obgyn, Erling Conte   Past Medical History:  Diagnosis Date  . Acute diastolic (congestive) heart failure ([place]) 08/12/2017  . AKI (acute kidney injury) ([place]) 11/04/2017  . Anemia   . Arthritis    HIP  . Asthma    as a child  . Atrial flutter [facility])    s/p aflutter ablation at [facility]  . Blood transfusion    never had a reaction to blood transfusion  . Cardiomyopathy Mar 2013   Mild, EF 50-55% by Mar 2013 ECHO, diast dysfxn II  . Closed fracture of right distal radius 10/18/2016  . CMV (cytomegalovirus infection) ([place]) 10/03/2015  . Constipation    takes Miralax daily  . Depression    takes Zoloft daily  . Distal radius fracture, right   . DVT of lower extremity, bilateral ([place]) 12/21/11   "they're there now; been there for 2 wks"  . Eczema   . ESRD (end stage renal disease) on dialysis [facility]) 09/2011   07-19-2015 had Kidney transplant at [facility]  . Fractures, stress    in both feet--6 OR 7 YRS AGO--RESOLVED  . Generalized edema [id number]  . Gout    doesn't require meds   . HCAP (healthcare-associated pneumonia)    09/10/11  . Hearing difficulty   . Hypercalcemia    09/10/11  . Hyperparathyroidism ([place]) 04/07/2013  . Hypertension    takes Diltiazem daily   . Hypomagnesemia 08/02/2015  . Hypophosphatemia 11/08/2017  . Immunosuppression ([place]) 07/25/2015  . Memory changes   . Morbid  obesity ([place])   . Nonischemic dilated cardiomyopathy ([place])   . Obesity 04/07/2013  . Oligouria   . PAF (paroxysmal atrial fibrillation) ([facility])     HX OF CEREBRAL BLEED WHILE ON COUMADIN-SO PT NOT ON ANY BLOOD THINNERS NOW  . Peripheral vascular disease ([place])   . Presence of Watchman left atrial appendage closure device 10/04/2017  . Proteinuria 09/04/2016  . Psoriasis 08/07/2016  . 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 Sepsis ([place]) 11/08/2017  . Sleep apnea    uses CPAP nightly  . Stroke [facility]) 2009   denies residual, hemorrhagic now off coumadin  . Stroke due to intracerebral hemorrhage ([place]) 2009  . Suture reaction 09/04/2016    Past Surgical History:  Procedure Laterality Date  . AV FISTULA PLACEMENT  09/28/2011   Procedure: ARTERIOVENOUS (AV) FISTULA CREATION;  Surgeon: Rosetta Posner, MD;  Location: [place];  Service: Vascular;  Laterality: Left;  . CARDIAC VALVE SURGERY  1960    FOR ATRIAL SEPTAL DEFECT  . CHOLECYSTECTOMY     1980's  . COLONOSCOPY    . CYSTOSCOPY     many yrs ago  . FEMUR FRACTURE SURGERY  03/2011; 09/03/2011   right; "had 2, 2 wk apart in 2012; broke it again 08/2011 & had OR"  .  I&D EXTREMITY  09/15/2011   Procedure: IRRIGATION AND DEBRIDEMENT EXTREMITY w REMOVAL OF HARDWARE;  Surgeon: [name], MD;  Location: [place];  Service: Orthopedics;  Laterality: Right;  . INCISION AND DRAINAGE HIP  12/21/2011   Procedure: IRRIGATION AND DEBRIDEMENT HIP;  Surgeon: [name], MD;  Location: [place];  Service: Orthopedics;  Laterality: Right;  I&D RIGHT HIP WITH PLACEMENT ANTIBIOTIC SPACER  . INSERTION OF DIALYSIS CATHETER     Procedure: INSERTION OF DIALYSIS CATHETER;  Surgeon: Rosetta Posner, MD;  Location: [place];  Service: Vascular;  Laterality: Right;  . IRRIGATION AND DEBRIDEMENT ABSCESS  12/21/11   right hip  . JOINT REPLACEMENT    . KIDNEY TRANSPLANT  07/19/15  . LAPAROSCOPIC GASTRIC BANDING    . OPEN REDUCTION INTERNAL FIXATION (ORIF) DISTAL RADIAL FRACTURE  Right 10/18/2016   Procedure: OPEN REDUCTION INTERNAL FIXATION (ORIF) RIGHT DISTAL RADIAL FRACTURE;  Surgeon: Marchia Bond, MD;  Location: [place];  Service: Orthopedics;  Laterality: Right;  . TOTAL HIP ARTHROPLASTY  02/2011   right THA 02/2011, I&D/removal of hardware 09/2011,, repeat I&D Jun 2013, reimplantation R THA 03-26-2012  . TOTAL HIP REVISION  03/25/2012   Procedure: TOTAL HIP REVISION;  Surgeon: [name], MD;  Location: [facility];  Service: Orthopedics;  Laterality: Right;  Right Total Hip Reimplantation  . VENA CAVA FILTER PLACEMENT  11/2011    Family History  Problem Relation Age of Onset  . Cancer Father   . Diabetes Mother   . Hypertension Mother   . Arthritis Mother   . Hodgkin's lymphoma Unknown 32       dscd---HODGKINS DISEASE  . Hypertension Brother     Social History   Socioeconomic History  . Marital status: Married    Spouse name: Not on file  . Number of children: Not on file  . Years of education: Not on file  . Highest education level: Not on file  Occupational History  . Occupation: retired  Scientific laboratory technician  . Financial resource strain: Not on file  . Food insecurity:    Worry: Not on file    Inability: Not on file  . Transportation needs:    Medical: Not on file    Non-medical: Not on file  Tobacco Use  . Smoking status: Never Smoker  . Smokeless tobacco: Never Used  Substance and Sexual Activity  . Alcohol use: No  . Drug use: No  . Sexual activity: Not Currently    Birth control/protection: Post-menopausal  Lifestyle  . Physical activity:    Days per week: Not on file    Minutes per session: Not on file  . Stress: Not on file  Relationships  . Social connections:    Talks on phone: Not on file    Gets together: Not on file    Attends religious service: Not on file    Active member of club or organization: Not on file    Attends meetings of clubs or organizations: Not on file    Relationship status: Not on file  .  Intimate partner violence:    Fear of current or ex partner: Not on file    Emotionally abused: Not on file    Physically abused: Not on file    Forced sexual activity: Not on file  Other Topics Concern  . Not on file  Social History Narrative   Married; retired; lives in [place]    Outpatient Medications Prior to Visit  Medication Sig  Dispense Refill  . acetaminophen (TYLENOL) 500 MG tablet Take 500 mg by mouth every 6 (six) hours as needed.    Marland Kitchen albuterol (PROVENTIL HFA;VENTOLIN HFA) 108 (90 Base) MCG/ACT inhaler Inhale 2 puffs into the lungs every 6 (six) hours as needed for wheezing or shortness of breath. 18 g 5  . aspirin EC 81 MG tablet Take 81 mg by mouth.    . budesonide-formoterol (SYMBICORT) 160-4.5 MCG/ACT inhaler Inhale 2 puffs into the lungs 2 (two) times daily. 1 Inhaler 0  . carvedilol (COREG) 12.5 MG tablet Take 12.5 mg by mouth 2 (two) times daily with a meal.    . ciprofloxacin (CIPRO) 500 MG tablet Take 500 mg by mouth 2 (two) times daily.  0  . docusate sodium (COLACE) 100 MG capsule Take 100 mg by mouth daily as needed (For constipation.).     Marland Kitchen furosemide (LASIX) 80 MG tablet Take 80 mg by mouth 2 (two) times daily.    . mycophenolate (MYFORTIC) 180 MG EC tablet Take 180 mg by mouth 2 (two) times daily.    . NONFORMULARY OR COMPOUNDED ITEM Compression stockings --thigh high  20-30 mm hg Dx  Low ext edema 1 each 0  . potassium chloride (KLOR-CON) 20 MEQ packet Take 20 mEq by mouth daily.    . predniSONE (DELTASONE) 5 MG tablet Take 5 mg by mouth daily with breakfast.    . rivaroxaban (XARELTO) 20 MG TABS tablet Take 1 tablet (20 mg total) by mouth daily. 30 tablet   . sertraline (ZOLOFT) 50 MG tablet Take 1 tablet (50 mg total) by mouth daily. 90 tablet 3  . tacrolimus (PROGRAF) 0.5 MG capsule Take 0.5 mg by mouth 2 (two) times daily.    Marland Kitchen amiodarone (PACERONE) 200 MG tablet Take 200 mg by mouth daily.  4   No facility-administered medications prior to visit.      Allergies  Allergen Reactions  . Ace Inhibitors Other (See Comments)    Reaction unknown  . Warfarin Sodium Other (See Comments)    Caused her to have a stroke    Review of Systems  Constitutional: Negative for chills, fever and malaise/fatigue.  HENT: Negative for congestion and hearing loss.   Eyes: Negative for discharge.  Respiratory: Negative for cough, sputum production and shortness of breath.   Cardiovascular: Negative for chest pain, palpitations and leg swelling.  Gastrointestinal: Negative for abdominal pain, blood in stool, constipation, diarrhea, heartburn, nausea and vomiting.  Genitourinary: Negative for dysuria, frequency, hematuria and urgency.  Musculoskeletal: Negative for back pain, falls and myalgias.  Skin: Negative for rash.  Neurological: Negative for dizziness, sensory change, loss of consciousness, weakness and headaches.  Endo/Heme/Allergies: Negative for environmental allergies. Does not bruise/bleed easily.  Psychiatric/Behavioral: Negative for depression and suicidal ideas. The patient is not nervous/anxious and does not have insomnia.        Objective:    Physical Exam  Constitutional: She is oriented to person, place, and time. She appears well-developed and well-nourished.  HENT:  Head: Normocephalic and atraumatic.  Eyes: Conjunctivae and EOM are normal.  Neck: Normal range of motion. Neck supple. No JVD present. Carotid bruit is not present. No thyromegaly present.  Cardiovascular: Normal rate, regular rhythm and normal heart sounds.  No murmur heard. Pulmonary/Chest: Effort normal and breath sounds normal. No respiratory distress. She has no wheezes. She has no rales. She exhibits no tenderness.  Musculoskeletal: She exhibits no edema.  Neurological: She is alert and oriented to person, place, and  time.  Skin: There is erythema.     Psychiatric: She has a normal mood and affect.  Nursing note and vitals reviewed.   BP 123/79 (BP  Location: Left Arm, Patient Position: Sitting, Cuff Size: Normal)   Pulse 99   Temp 98.1 F (36.7 C) (Oral)   Resp 16   SpO2 100%  Wt Readings from Last 3 Encounters:  12/10/17 218 lb (98.9 kg)  12/04/17 221 lb (100.2 kg)  12/02/17 220 lb (99.8 kg)   BP Readings from Last 3 Encounters:  12/13/17 123/79  12/10/17 105/69  12/04/17 135/90     Immunization History  Administered Date(s) Administered  . Influenza Split 03/27/2012  . Influenza Whole 04/29/2008  . Influenza, High Dose Seasonal PF 04/19/2017  . Influenza,inj,Quad PF,6+ Mos 04/24/2013  . Influenza-Unspecified 04/29/2014, 03/17/2015, 04/19/2016  . Pneumococcal Conjugate-13 03/02/2014  . Pneumococcal Polysaccharide-23 04/25/2005, 09/18/2016  . Tdap 05/08/2013    Health Maintenance  Topic Date Due  . COLONOSCOPY  07/24/2017  . INFLUENZA VACCINE  02/13/2018  . MAMMOGRAM  05/15/2019  . TETANUS/TDAP  05/09/2023  . DEXA SCAN  Completed  . Hepatitis C Screening  Completed  . PNA vac Low Risk Adult  Completed    Lab Results  Component Value Date   WBC 32.7 (H) 11/03/2017   HGB 8.7 (L) 11/03/2017   HCT 26.8 (L) 11/03/2017   PLT 259 11/03/2017   GLUCOSE 136 (H) 11/03/2017   CHOL  11/16/2008    184        ATP III CLASSIFICATION:  <200     mg/dL   Desirable  200-239  mg/dL   Borderline High  >=240    mg/dL   High          TRIG 157 (H) 09/15/2011   HDL 33 (L) 11/16/2008   LDLCALC (H) 11/16/2008    130        Total Cholesterol/HDL:CHD Risk Coronary Heart Disease Risk Table                     Men   Women  1/2 Average Risk   3.4   3.3  Average Risk       5.0   4.4  2 X Average Risk   9.6   7.1  3 X Average Risk  23.4   11.0        Use the calculated Patient Ratio above and the CHD Risk Table to determine the patient's CHD Risk.        ATP III CLASSIFICATION (LDL):  <100     mg/dL   Optimal  100-129  mg/dL   Near or Above                    Optimal  130-159  mg/dL   Borderline  160-189  mg/dL   High   >190     mg/dL   Very High   ALT 20 11/03/2017   AST 17 11/03/2017   NA 138 11/03/2017   K 3.2 (L) 11/03/2017   CL 97 (L) 11/03/2017   CREATININE 3.47 (H) 11/03/2017   BUN 52 (H) 11/03/2017   CO2 28 11/03/2017   TSH 2.655 09/11/2011   INR 1.90 11/03/2017   HGBA1C  11/16/2008    5.5 (NOTE) The ADA recommends the following therapeutic goal for glycemic control related to Hgb A1c measurement: Goal of therapy: <6.5 Hgb A1c  Reference: American Diabetes Association: Clinical Practice Recommendations 2010, Diabetes Care, 2010,  33: (Suppl  1).    Lab Results  Component Value Date   TSH 2.655 09/11/2011   Lab Results  Component Value Date   WBC 32.7 (H) 11/03/2017   HGB 8.7 (L) 11/03/2017   HCT 26.8 (L) 11/03/2017   MCV 77.0 (L) 11/03/2017   PLT 259 11/03/2017   Lab Results  Component Value Date   NA 138 11/03/2017   K 3.2 (L) 11/03/2017   CO2 28 11/03/2017   GLUCOSE 136 (H) 11/03/2017   BUN 52 (H) 11/03/2017   CREATININE 3.47 (H) 11/03/2017   BILITOT 1.4 (H) 11/03/2017   ALKPHOS 81 11/03/2017   AST 17 11/03/2017   ALT 20 11/03/2017   PROT 6.4 (L) 11/03/2017   ALBUMIN 2.6 (L) 11/03/2017   CALCIUM 9.0 11/03/2017   ANIONGAP 13 11/03/2017   Lab Results  Component Value Date   CHOL  11/16/2008    184        ATP III CLASSIFICATION:  <200     mg/dL   Desirable  200-239  mg/dL   Borderline High  >=240    mg/dL   High          Lab Results  Component Value Date   HDL 33 (L) 11/16/2008   Lab Results  Component Value Date   LDLCALC (H) 11/16/2008    130        Total Cholesterol/HDL:CHD Risk Coronary Heart Disease Risk Table                     Men   Women  1/2 Average Risk   3.4   3.3  Average Risk       5.0   4.4  2 X Average Risk   9.6   7.1  3 X Average Risk  23.4   11.0        Use the calculated Patient Ratio above and the CHD Risk Table to determine the patient's CHD Risk.        ATP III CLASSIFICATION (LDL):  <100     mg/dL   Optimal  100-129  mg/dL    Near or Above                    Optimal  130-159  mg/dL   Borderline  160-189  mg/dL   High  >190     mg/dL   Very High   Lab Results  Component Value Date   TRIG 157 (H) 09/15/2011   Lab Results  Component Value Date   CHOLHDL 5.6 11/16/2008   Lab Results  Component Value Date   HGBA1C  11/16/2008    5.5 (NOTE) The ADA recommends the following therapeutic goal for glycemic control related to Hgb A1c measurement: Goal of therapy: <6.5 Hgb A1c  Reference: American Diabetes Association: Clinical Practice Recommendations 2010, Diabetes Care, 2010, 33: (Suppl  1).         Assessment & Plan:   Problem List Items Addressed This Visit      Unprioritized   Abscess of right leg - Primary    Warm compress abx per orders Elevate leg Hesitant to lance due to pt being on xaralto Consider surgery consult if no improvement Rocephin IM        Relevant Medications   doxycycline (VIBRA-TABS) 100 MG tablet    Other Visit Diagnoses    Dysuria       Relevant Medications   cefTRIAXone (ROCEPHIN) injection 1 g (Completed)  Other Relevant Orders   POCT Urinalysis Dipstick (Automated) (Completed)      I have discontinued Mariann Laster T. Meeker's amiodarone. I am also having her start on doxycycline. Additionally, I am having her maintain her docusate sodium, aspirin EC, mycophenolate, sertraline, albuterol, budesonide-formoterol, predniSONE, rivaroxaban, tacrolimus, carvedilol, acetaminophen, potassium chloride, furosemide, ciprofloxacin, and NONFORMULARY OR COMPOUNDED ITEM. We administered cefTRIAXone.  Meds ordered this encounter  Medications  . doxycycline (VIBRA-TABS) 100 MG tablet    Sig: Take 1 tablet (100 mg total) by mouth 2 (two) times daily.    Dispense:  20 tablet    Refill:  0  . cefTRIAXone (ROCEPHIN) injection 1 g    CMA served as scribe during this visit. History, Physical and Plan performed by medical provider. Documentation and orders reviewed and attested to.    [name], DO

## 2017-12-14 DIAGNOSIS — L02415 Cutaneous abscess of right lower limb: Secondary | ICD-10-CM | POA: Insufficient documentation

## 2017-12-14 NOTE — Assessment & Plan Note (Signed)
Warm compress abx per orders Elevate leg Hesitant to lance due to pt being on xaralto Consider surgery consult if no improvement Rocephin IM

## 2017-12-16 ENCOUNTER — Encounter (HOSPITAL_COMMUNITY): Payer: Self-pay | Admitting: Nurse Practitioner

## 2017-12-16 ENCOUNTER — Telehealth: Payer: Self-pay

## 2017-12-16 ENCOUNTER — Ambulatory Visit (HOSPITAL_COMMUNITY)
Admission: RE | Admit: 2017-12-16 | Discharge: 2017-12-16 | Disposition: A | Discharge status: Home / Self Care | Payer: Medicare Other | Source: Ambulatory Visit | Attending: Nurse Practitioner | Admitting: Nurse Practitioner

## 2017-12-16 VITALS — BP 122/74 | HR 81 | Ht 65.0 in | Wt 214.6 lb

## 2017-12-16 DIAGNOSIS — Z8249 Family history of ischemic heart disease and other diseases of the circulatory system: Secondary | ICD-10-CM | POA: Insufficient documentation

## 2017-12-16 DIAGNOSIS — I5032 Chronic diastolic (congestive) heart failure: Secondary | ICD-10-CM | POA: Diagnosis not present

## 2017-12-16 DIAGNOSIS — I739 Peripheral vascular disease, unspecified: Secondary | ICD-10-CM | POA: Insufficient documentation

## 2017-12-16 DIAGNOSIS — I4891 Unspecified atrial fibrillation: Secondary | ICD-10-CM

## 2017-12-16 DIAGNOSIS — N186 End stage renal disease: Secondary | ICD-10-CM | POA: Diagnosis not present

## 2017-12-16 DIAGNOSIS — I420 Dilated cardiomyopathy: Secondary | ICD-10-CM | POA: Diagnosis not present

## 2017-12-16 DIAGNOSIS — I4892 Unspecified atrial flutter: Secondary | ICD-10-CM | POA: Insufficient documentation

## 2017-12-16 DIAGNOSIS — G4733 Obstructive sleep apnea (adult) (pediatric): Secondary | ICD-10-CM | POA: Insufficient documentation

## 2017-12-16 DIAGNOSIS — I132 Hypertensive heart and chronic kidney disease with heart failure and with stage 5 chronic kidney disease, or end stage renal disease: Secondary | ICD-10-CM | POA: Diagnosis not present

## 2017-12-16 DIAGNOSIS — Z7982 Long term (current) use of aspirin: Secondary | ICD-10-CM | POA: Diagnosis not present

## 2017-12-16 DIAGNOSIS — Z6835 Body mass index (BMI) 35.0-35.9, adult: Secondary | ICD-10-CM | POA: Diagnosis not present

## 2017-12-16 DIAGNOSIS — H9190 Unspecified hearing loss, unspecified ear: Secondary | ICD-10-CM | POA: Diagnosis not present

## 2017-12-16 DIAGNOSIS — Z86718 Personal history of other venous thrombosis and embolism: Secondary | ICD-10-CM | POA: Insufficient documentation

## 2017-12-16 DIAGNOSIS — Z992 Dependence on renal dialysis: Secondary | ICD-10-CM | POA: Insufficient documentation

## 2017-12-16 DIAGNOSIS — Z7901 Long term (current) use of anticoagulants: Secondary | ICD-10-CM | POA: Insufficient documentation

## 2017-12-16 DIAGNOSIS — Z96641 Presence of right artificial hip joint: Secondary | ICD-10-CM | POA: Insufficient documentation

## 2017-12-16 DIAGNOSIS — M1990 Unspecified osteoarthritis, unspecified site: Secondary | ICD-10-CM | POA: Insufficient documentation

## 2017-12-16 DIAGNOSIS — Z79899 Other long term (current) drug therapy: Secondary | ICD-10-CM | POA: Diagnosis not present

## 2017-12-16 DIAGNOSIS — M109 Gout, unspecified: Secondary | ICD-10-CM | POA: Diagnosis not present

## 2017-12-16 DIAGNOSIS — L409 Psoriasis, unspecified: Secondary | ICD-10-CM | POA: Insufficient documentation

## 2017-12-16 DIAGNOSIS — J45909 Unspecified asthma, uncomplicated: Secondary | ICD-10-CM | POA: Insufficient documentation

## 2017-12-16 DIAGNOSIS — Z9049 Acquired absence of other specified parts of digestive tract: Secondary | ICD-10-CM | POA: Insufficient documentation

## 2017-12-16 DIAGNOSIS — I480 Paroxysmal atrial fibrillation: Secondary | ICD-10-CM | POA: Insufficient documentation

## 2017-12-16 DIAGNOSIS — Z940 Kidney transplant status: Secondary | ICD-10-CM | POA: Diagnosis not present

## 2017-12-16 DIAGNOSIS — E213 Hyperparathyroidism, unspecified: Secondary | ICD-10-CM | POA: Insufficient documentation

## 2017-12-16 DIAGNOSIS — F329 Major depressive disorder, single episode, unspecified: Secondary | ICD-10-CM | POA: Insufficient documentation

## 2017-12-16 DIAGNOSIS — Z9884 Bariatric surgery status: Secondary | ICD-10-CM | POA: Insufficient documentation

## 2017-12-16 DIAGNOSIS — Z9889 Other specified postprocedural states: Secondary | ICD-10-CM | POA: Insufficient documentation

## 2017-12-16 DIAGNOSIS — Z8673 Personal history of transient ischemic attack (TIA), and cerebral infarction without residual deficits: Secondary | ICD-10-CM | POA: Insufficient documentation

## 2017-12-16 DIAGNOSIS — Z888 Allergy status to other drugs, medicaments and biological substances status: Secondary | ICD-10-CM | POA: Diagnosis not present

## 2017-12-16 NOTE — Telephone Encounter (Signed)
-----   Message from [name], DO sent at 12/12/2017  5:17 PM EDT ----- Contaminated--- if she is having any symptoms we should recheck the urine

## 2017-12-16 NOTE — Telephone Encounter (Signed)
Author phoned pt. To follow-up on dysuria for 5/31 visit with Dr. Etter Sjogren. No answer, author left VM to return call to [phone].

## 2017-12-16 NOTE — Progress Notes (Signed)
Primary Care Physician: Carollee Herter, Alferd Apa, DO Referring Physician: Dr. Ashok Norris   [name] is a 72 y.o. female with a h/o hypertensive DCM, HTN, OSA, s/p renal transplant, s/p cerebral  Bleed, 11/2010, morbid obesity, s/p DVT/IVC filter and paroxysmal afib for years. She was recently seen by Dr.  Radford Pax, she had not seen pt in 3 years. Renal transplant, 09/5463, which was complicated by new onset atrial flutter and she underwent aflutter ablation.  Was on limited anticoagulation just to get thru ablation, but records indicate from hospitalization for  [place], 2012, per neurologist  that she is never take anticoagulation again. She was on coumadin with the prior bleed. She is tolerating a baby asa daily.   Pt was seen in the afib cllinic from Dr. Theodosia Blender office as urgent work in 07/2017. Found to be in asymptomatic afib with reasonable rate control. She had been placed on a prednisone taper for an asthma flare. Recently diagnosed with asthma,  states new shortness of breath for 3-4 weeks, ? possibly afib contributing to symptoms of shortness of breath. She does not smoke, use alcohol, no significant caffeine.  I discussed with Dr. Rayann Heman at the time and he felt it was best for her return to the EP at [facility] who did her ablation and consider her for a Watchman as we do not have an active program here.   F/u, afib clinic, 6/3. Dr. Radford Pax referred back her today for stopping amiodarone, per pt, pulmonology wanted stopped for concern of interstitial disease. However, she is seeing EP at [facility] and they started the amiodarone at time of her Watchman and is still actively a pt of theirs. She is on anticoagulation  but this to be short term. Has f/u in September at [place]  but needs to be  moved up. She states that they were aware that amiodarone was going to be stopped. She is not the best candidate for Tikosyn or sotalol for her CKD.  Today, she denies symptoms of palpitations, chest pain, + for   shortness of breath, no orthopnea, PND, lower extremity edema, dizziness, presyncope, syncope, or neurologic sequela. The patient is tolerating medications without difficulties and is otherwise without complaint today.   Past Medical History:  Diagnosis Date  . Acute diastolic (congestive) heart failure ([place]) 08/12/2017  . AKI (acute kidney injury) ([place]) 11/04/2017  . Anemia   . Arthritis    HIP  . Asthma    as a child  . Atrial flutter [facility])    s/p aflutter ablation at [facility]  . Blood transfusion    never had a reaction to blood transfusion  . Cardiomyopathy Mar 2013   Mild, EF 50-55% by Mar 2013 ECHO, diast dysfxn II  . Closed fracture of right distal radius 10/18/2016  . CMV (cytomegalovirus infection) ([place]) 10/03/2015  . Constipation    takes Miralax daily  . Depression    takes Zoloft daily  . Distal radius fracture, right   . DVT of lower extremity, bilateral ([place]) 12/21/11   "they're there now; been there for 2 wks"  . Eczema   . ESRD (end stage renal disease) on dialysis [facility]) 09/2011   07-19-2015 had Kidney transplant at [facility]  . Fractures, stress    in both feet--6 OR 7 YRS AGO--RESOLVED  . Generalized edema [id number]  . Gout    doesn't require meds   . HCAP (healthcare-associated pneumonia)    09/10/11  . Hearing difficulty   . Hypercalcemia  09/10/11  . Hyperparathyroidism ([place]) 04/07/2013  . Hypertension    takes Diltiazem daily   . Hypomagnesemia 08/02/2015  . Hypophosphatemia 11/08/2017  . Immunosuppression ([place]) 07/25/2015  . Memory changes   . Morbid obesity ([place])   . Nonischemic dilated cardiomyopathy ([place])   . Obesity 04/07/2013  . Oligouria   . PAF (paroxysmal atrial fibrillation) ([facility])     HX OF CEREBRAL BLEED WHILE ON COUMADIN-SO PT NOT ON ANY BLOOD THINNERS NOW  . Peripheral vascular disease ([place])   . Presence of Watchman left atrial appendage closure device 10/04/2017  . Proteinuria 09/04/2016  . Psoriasis 08/07/2016  . Renal transplant  recipient 07/25/2015  . Sepsis ([place]) 11/08/2017  . Sleep apnea    uses CPAP nightly  . Stroke [facility]) 2009   denies residual, hemorrhagic now off coumadin  . Stroke due to intracerebral hemorrhage ([place]) 2009  . Suture reaction 09/04/2016   Past Surgical History:  Procedure Laterality Date  . AV FISTULA PLACEMENT  09/28/2011   Procedure: ARTERIOVENOUS (AV) FISTULA CREATION;  Surgeon: Rosetta Posner, MD;  Location: [place];  Service: Vascular;  Laterality: Left;  . CARDIAC VALVE SURGERY  1960    FOR ATRIAL SEPTAL DEFECT  . CHOLECYSTECTOMY     1980's  . COLONOSCOPY    . CYSTOSCOPY     many yrs ago  . FEMUR FRACTURE SURGERY  03/2011; 09/03/2011   right; "had 2, 2 wk apart in 2012; broke it again 08/2011 & had OR"  . I&D EXTREMITY  09/15/2011   Procedure: IRRIGATION AND DEBRIDEMENT EXTREMITY w REMOVAL OF HARDWARE;  Surgeon: [name], MD;  Location: [place];  Service: Orthopedics;  Laterality: Right;  . INCISION AND DRAINAGE HIP  12/21/2011   Procedure: IRRIGATION AND DEBRIDEMENT HIP;  Surgeon: [name], MD;  Location: [place];  Service: Orthopedics;  Laterality: Right;  I&D RIGHT HIP WITH PLACEMENT ANTIBIOTIC SPACER  . INSERTION OF DIALYSIS CATHETER     Procedure: INSERTION OF DIALYSIS CATHETER;  Surgeon: Rosetta Posner, MD;  Location: [place];  Service: Vascular;  Laterality: Right;  . IRRIGATION AND DEBRIDEMENT ABSCESS  12/21/11   right hip  . JOINT REPLACEMENT    . KIDNEY TRANSPLANT  07/19/15  . LAPAROSCOPIC GASTRIC BANDING    . OPEN REDUCTION INTERNAL FIXATION (ORIF) DISTAL RADIAL FRACTURE Right 10/18/2016   Procedure: OPEN REDUCTION INTERNAL FIXATION (ORIF) RIGHT DISTAL RADIAL FRACTURE;  Surgeon: Marchia Bond, MD;  Location: [place];  Service: Orthopedics;  Laterality: Right;  . TOTAL HIP ARTHROPLASTY  02/2011   right THA 02/2011, I&D/removal of hardware 09/2011,, repeat I&D Jun 2013, reimplantation R THA 03-26-2012  . TOTAL HIP REVISION  03/25/2012   Procedure: TOTAL HIP  REVISION;  Surgeon: [name], MD;  Location: [facility];  Service: Orthopedics;  Laterality: Right;  Right Total Hip Reimplantation  . VENA CAVA FILTER PLACEMENT  11/2011    Current Outpatient Medications  Medication Sig Dispense Refill  . acetaminophen (TYLENOL) 500 MG tablet Take 500 mg by mouth every 6 (six) hours as needed.    Marland Kitchen albuterol (PROVENTIL HFA;VENTOLIN HFA) 108 (90 Base) MCG/ACT inhaler Inhale 2 puffs into the lungs every 6 (six) hours as needed for wheezing or shortness of breath. 18 g 5  . aspirin EC 81 MG tablet Take 81 mg by mouth.    . budesonide-formoterol (SYMBICORT) 160-4.5 MCG/ACT inhaler Inhale 2 puffs into the lungs 2 (two) times daily. 1 Inhaler 0  . carvedilol (COREG) 25  MG tablet Take 25 mg by mouth 2 (two) times daily with a meal.    . docusate sodium (COLACE) 100 MG capsule Take 100 mg by mouth daily as needed (For constipation.).     Marland Kitchen doxycycline (VIBRA-TABS) 100 MG tablet Take 1 tablet (100 mg total) by mouth 2 (two) times daily. 20 tablet 0  . ferrous sulfate 325 (65 FE) MG tablet Take 325 mg by mouth daily with breakfast.    . furosemide (LASIX) 80 MG tablet Take 80 mg by mouth 2 (two) times daily.    . mycophenolate (MYFORTIC) 180 MG EC tablet Take 180 mg by mouth 2 (two) times daily.    . NONFORMULARY OR COMPOUNDED ITEM Compression stockings --thigh high  20-30 mm hg Dx  Low ext edema 1 each 0  . potassium chloride SA (K-DUR,KLOR-CON) 20 MEQ tablet Take 20 mEq by mouth 2 (two) times daily.    . predniSONE (DELTASONE) 5 MG tablet Take 5 mg by mouth daily with breakfast.    . rivaroxaban (XARELTO) 20 MG TABS tablet Take 1 tablet (20 mg total) by mouth daily. 30 tablet   . sertraline (ZOLOFT) 50 MG tablet Take 1 tablet (50 mg total) by mouth daily. 90 tablet 3  . Tacrolimus (ENVARSUS XR) 1 MG TB24 Take 1 tablet by mouth daily.     No current facility-administered medications for this encounter.     Allergies  Allergen Reactions  . Ace Inhibitors Other  (See Comments)    Reaction unknown  . Warfarin Sodium Other (See Comments)    Caused her to have a stroke    Social History   Socioeconomic History  . Marital status: Married    Spouse name: Not on file  . Number of children: Not on file  . Years of education: Not on file  . Highest education level: Not on file  Occupational History  . Occupation: retired  Scientific laboratory technician  . Financial resource strain: Not on file  . Food insecurity:    Worry: Not on file    Inability: Not on file  . Transportation needs:    Medical: Not on file    Non-medical: Not on file  Tobacco Use  . Smoking status: Never Smoker  . Smokeless tobacco: Never Used  Substance and Sexual Activity  . Alcohol use: No  . Drug use: No  . Sexual activity: Not Currently    Birth control/protection: Post-menopausal  Lifestyle  . Physical activity:    Days per week: Not on file    Minutes per session: Not on file  . Stress: Not on file  Relationships  . Social connections:    Talks on phone: Not on file    Gets together: Not on file    Attends religious service: Not on file    Active member of club or organization: Not on file    Attends meetings of clubs or organizations: Not on file    Relationship status: Not on file  . Intimate partner violence:    Fear of current or ex partner: Not on file    Emotionally abused: Not on file    Physically abused: Not on file    Forced sexual activity: Not on file  Other Topics Concern  . Not on file  Social History Narrative   Married; retired; lives in [place]    Family History  Problem Relation Age of Onset  . Cancer Father   . Diabetes Mother   . Hypertension Mother   .  Arthritis Mother   . Hodgkin's lymphoma Unknown 32       dscd---HODGKINS DISEASE  . Hypertension Brother     ROS- All systems are reviewed and negative except as per the HPI above  Physical Exam: Vitals:   12/16/17 1033  BP: 122/74  Pulse: 81  SpO2: 97%  Weight: 214 lb 9.6 oz  (97.3 kg)  Height: 5\' 5"  (1.651 m)   Wt Readings from Last 3 Encounters:  12/16/17 214 lb 9.6 oz (97.3 kg)  12/10/17 218 lb (98.9 kg)  12/04/17 221 lb (100.2 kg)    Labs: Lab Results  Component Value Date   NA 138 11/03/2017   K 3.2 (L) 11/03/2017   CL 97 (L) 11/03/2017   CO2 28 11/03/2017   GLUCOSE 136 (H) 11/03/2017   BUN 52 (H) 11/03/2017   CREATININE 3.47 (H) 11/03/2017   CALCIUM 9.0 11/03/2017   PHOS 4.8 (H) 03/31/2012   MG 2.0 09/13/2011   Lab Results  Component Value Date   INR 1.90 11/03/2017   Lab Results  Component Value Date   CHOL  11/16/2008    184        ATP III CLASSIFICATION:  <200     mg/dL   Desirable  200-239  mg/dL   Borderline High  >=240    mg/dL   High          HDL 33 (L) 11/16/2008   LDLCALC (H) 11/16/2008    130        Total Cholesterol/HDL:CHD Risk Coronary Heart Disease Risk Table                     Men   Women  1/2 Average Risk   3.4   3.3  Average Risk       5.0   4.4  2 X Average Risk   9.6   7.1  3 X Average Risk  23.4   11.0        Use the calculated Patient Ratio above and the CHD Risk Table to determine the patient's CHD Risk.        ATP III CLASSIFICATION (LDL):  <100     mg/dL   Optimal  100-129  mg/dL   Near or Above                    Optimal  130-159  mg/dL   Borderline  160-189  mg/dL   High  >190     mg/dL   Very High   TRIG 157 (H) 09/15/2011     GEN- The patient is well appearing, alert and oriented x 3 today.   Head- normocephalic, atraumatic Eyes-  Sclera clear, conjunctiva pink Ears- hearing intact Oropharynx- clear Neck- supple, no JVP Lymph- no cervical lymphadenopathy Lungs- Clear to ausculation bilaterally, normal work of breathing Heart- irregular rate and rhythm, no murmurs, rubs or gallops, PMI not laterally displaced GI- soft, NT, ND, + BS Extremities- no clubbing, cyanosis, or edema MS- no significant deformity or atrophy Skin- no rash or lesion Psych- euthymic mood, full affect Neuro-  strength and sensation are intact  EKG-afib at 81 bpm, qrs int 106 ms, qtc 432 ms, ILBBB Epic records reviewed   Assessment and Plan: 1. Atrial fibrillation Followed by EP at [facility], recently got a Watchman in the last couple of months there, on short term anticoagulation and amiodarone was started at [facility] I will ask her to move up the appointment from September  with [facility]  to see what is next with amio being stopped  She is rate controlled  Continue with carvedilol 25 mg bid Continue with ASA as pt with h/o cerebral bleed, per records in Epic at time of [place] in 2012, she was told to be off anticoagulation forever, however, she is on short term for receiving Watchman This is a very complicated pt she needs to stay with the EP group at [facility] as they are currently treating her afib Cpap for OSA as per Dr. Radford Pax, stressed use of cpap to help with afib burden  Pt will request for appointment to be moved up at [place]. [place], [facility] [address] [place], [place] [address] [phone]

## 2017-12-16 NOTE — Patient Instructions (Signed)
Contact Dr. Gelene Mink at [place] for follow up.

## 2017-12-17 ENCOUNTER — Telehealth: Payer: Self-pay

## 2017-12-17 DIAGNOSIS — E876 Hypokalemia: Secondary | ICD-10-CM | POA: Diagnosis not present

## 2017-12-17 DIAGNOSIS — I132 Hypertensive heart and chronic kidney disease with heart failure and with stage 5 chronic kidney disease, or end stage renal disease: Secondary | ICD-10-CM | POA: Diagnosis not present

## 2017-12-17 DIAGNOSIS — E1122 Type 2 diabetes mellitus with diabetic chronic kidney disease: Secondary | ICD-10-CM | POA: Diagnosis not present

## 2017-12-17 DIAGNOSIS — N390 Urinary tract infection, site not specified: Secondary | ICD-10-CM | POA: Diagnosis not present

## 2017-12-17 DIAGNOSIS — M79662 Pain in left lower leg: Principal | ICD-10-CM

## 2017-12-17 DIAGNOSIS — N186 End stage renal disease: Secondary | ICD-10-CM | POA: Diagnosis not present

## 2017-12-17 DIAGNOSIS — I5030 Unspecified diastolic (congestive) heart failure: Secondary | ICD-10-CM | POA: Diagnosis not present

## 2017-12-17 DIAGNOSIS — M79661 Pain in right lower leg: Secondary | ICD-10-CM

## 2017-12-17 DIAGNOSIS — I420 Dilated cardiomyopathy: Secondary | ICD-10-CM | POA: Diagnosis not present

## 2017-12-17 DIAGNOSIS — I480 Paroxysmal atrial fibrillation: Secondary | ICD-10-CM | POA: Diagnosis not present

## 2017-12-17 NOTE — Telephone Encounter (Signed)
See other phone note

## 2017-12-17 NOTE — Telephone Encounter (Signed)
Pt. returned phone call CC:[id number] and possibly contaminated urine sample. Pt. denies s/s dysuria, no back pain, or pain while urinating, stating "I feel fine", but is concerned still about her leg abscess, as it's "still there". Pt states that she thinks she notices another knot on the same leg as well. Concern forwarded to Dr. Etter Sjogren for review.

## 2017-12-17 NOTE — Addendum Note (Signed)
Addended by: Kem Boroughs D on: 12/17/2017 04:47 PM   Modules accepted: Orders

## 2017-12-17 NOTE — Telephone Encounter (Signed)
We need to get [name] low ext  Possible vascular referral

## 2017-12-17 NOTE — Telephone Encounter (Signed)
Per Dr. Etter Sjogren check status on leg.  Patient states that leg is not getting worse.  Dr. Etter Sjogren will order bilateral US of legs.

## 2017-12-18 ENCOUNTER — Telehealth: Payer: Self-pay | Admitting: *Deleted

## 2017-12-18 DIAGNOSIS — I480 Paroxysmal atrial fibrillation: Secondary | ICD-10-CM | POA: Diagnosis not present

## 2017-12-18 DIAGNOSIS — I132 Hypertensive heart and chronic kidney disease with heart failure and with stage 5 chronic kidney disease, or end stage renal disease: Secondary | ICD-10-CM | POA: Diagnosis not present

## 2017-12-18 DIAGNOSIS — E1122 Type 2 diabetes mellitus with diabetic chronic kidney disease: Secondary | ICD-10-CM | POA: Diagnosis not present

## 2017-12-18 DIAGNOSIS — N186 End stage renal disease: Secondary | ICD-10-CM | POA: Diagnosis not present

## 2017-12-18 DIAGNOSIS — I420 Dilated cardiomyopathy: Secondary | ICD-10-CM | POA: Diagnosis not present

## 2017-12-18 DIAGNOSIS — I5030 Unspecified diastolic (congestive) heart failure: Secondary | ICD-10-CM | POA: Diagnosis not present

## 2017-12-18 NOTE — Telephone Encounter (Signed)
Received Physician Orders from [facility]; forwarded to provider/SLS 06/05  

## 2017-12-19 ENCOUNTER — Telehealth: Payer: Self-pay

## 2017-12-19 ENCOUNTER — Other Ambulatory Visit: Payer: Self-pay | Admitting: Family Medicine

## 2017-12-19 ENCOUNTER — Ambulatory Visit (HOSPITAL_BASED_OUTPATIENT_CLINIC_OR_DEPARTMENT_OTHER)
Admission: RE | Admit: 2017-12-19 | Discharge: 2017-12-19 | Disposition: A | Discharge status: Home / Self Care | Payer: Medicare Other | Source: Ambulatory Visit | Attending: Family Medicine | Admitting: Family Medicine

## 2017-12-19 DIAGNOSIS — M79661 Pain in right lower leg: Secondary | ICD-10-CM

## 2017-12-19 DIAGNOSIS — I619 Nontraumatic intracerebral hemorrhage, unspecified: Secondary | ICD-10-CM

## 2017-12-19 DIAGNOSIS — M79662 Pain in left lower leg: Principal | ICD-10-CM

## 2017-12-19 NOTE — Telephone Encounter (Signed)
Copied from [place] [id number]. Topic: Referral - Request >> Dec 18, 2017 10:41 AM Valla Leaver wrote: Reason for CRM: Claiborne Billings, PT with [facility] calling to notify Dr. Etter Sjogren that the patient is being discharged from home physical therapy and is now needing a referral to outpatient PT at [place] at [facility]. Their fax is [phone].

## 2017-12-19 NOTE — Telephone Encounter (Signed)
Referral in

## 2017-12-20 NOTE — Telephone Encounter (Signed)
Referral placed in epic.

## 2017-12-21 ENCOUNTER — Ambulatory Visit (HOSPITAL_BASED_OUTPATIENT_CLINIC_OR_DEPARTMENT_OTHER)
Admission: RE | Admit: 2017-12-21 | Discharge: 2017-12-21 | Disposition: A | Discharge status: Home / Self Care | Payer: Medicare Other | Source: Ambulatory Visit | Attending: Family Medicine | Admitting: Family Medicine

## 2017-12-21 DIAGNOSIS — M79662 Pain in left lower leg: Secondary | ICD-10-CM | POA: Diagnosis not present

## 2017-12-21 DIAGNOSIS — I82411 Acute embolism and thrombosis of right femoral vein: Secondary | ICD-10-CM | POA: Diagnosis not present

## 2017-12-21 DIAGNOSIS — M79661 Pain in right lower leg: Secondary | ICD-10-CM | POA: Diagnosis not present

## 2017-12-21 DIAGNOSIS — I82412 Acute embolism and thrombosis of left femoral vein: Secondary | ICD-10-CM | POA: Diagnosis not present

## 2017-12-23 ENCOUNTER — Other Ambulatory Visit: Payer: Self-pay | Admitting: Family Medicine

## 2017-12-23 ENCOUNTER — Telehealth: Payer: Self-pay | Admitting: *Deleted

## 2017-12-23 DIAGNOSIS — I420 Dilated cardiomyopathy: Secondary | ICD-10-CM | POA: Diagnosis not present

## 2017-12-23 DIAGNOSIS — N186 End stage renal disease: Secondary | ICD-10-CM | POA: Diagnosis not present

## 2017-12-23 DIAGNOSIS — I5030 Unspecified diastolic (congestive) heart failure: Secondary | ICD-10-CM | POA: Diagnosis not present

## 2017-12-23 DIAGNOSIS — I480 Paroxysmal atrial fibrillation: Secondary | ICD-10-CM | POA: Diagnosis not present

## 2017-12-23 DIAGNOSIS — I132 Hypertensive heart and chronic kidney disease with heart failure and with stage 5 chronic kidney disease, or end stage renal disease: Secondary | ICD-10-CM | POA: Diagnosis not present

## 2017-12-23 DIAGNOSIS — E1122 Type 2 diabetes mellitus with diabetic chronic kidney disease: Secondary | ICD-10-CM | POA: Diagnosis not present

## 2017-12-23 DIAGNOSIS — L0291 Cutaneous abscess, unspecified: Secondary | ICD-10-CM

## 2017-12-23 NOTE — Telephone Encounter (Signed)
Received Physician Orders from [facility]; forwarded to provider/SLS 06/10

## 2017-12-24 ENCOUNTER — Other Ambulatory Visit: Payer: Self-pay | Admitting: *Deleted

## 2017-12-26 ENCOUNTER — Telehealth: Payer: Self-pay | Admitting: *Deleted

## 2017-12-26 NOTE — Telephone Encounter (Signed)
Received Physician Orders from [facility]; forwarded to provider/SLS 06/13

## 2017-12-30 DIAGNOSIS — N179 Acute kidney failure, unspecified: Secondary | ICD-10-CM | POA: Diagnosis not present

## 2017-12-30 DIAGNOSIS — N2581 Secondary hyperparathyroidism of renal origin: Secondary | ICD-10-CM | POA: Diagnosis not present

## 2017-12-30 DIAGNOSIS — Z940 Kidney transplant status: Secondary | ICD-10-CM | POA: Diagnosis not present

## 2017-12-30 DIAGNOSIS — I129 Hypertensive chronic kidney disease with stage 1 through stage 4 chronic kidney disease, or unspecified chronic kidney disease: Secondary | ICD-10-CM | POA: Diagnosis not present

## 2017-12-30 DIAGNOSIS — I770 Arteriovenous fistula, acquired: Secondary | ICD-10-CM | POA: Diagnosis not present

## 2017-12-30 DIAGNOSIS — N182 Chronic kidney disease, stage 2 (mild): Secondary | ICD-10-CM | POA: Diagnosis not present

## 2017-12-30 DIAGNOSIS — M109 Gout, unspecified: Secondary | ICD-10-CM | POA: Diagnosis not present

## 2017-12-30 DIAGNOSIS — R809 Proteinuria, unspecified: Secondary | ICD-10-CM | POA: Diagnosis not present

## 2017-12-30 DIAGNOSIS — R319 Hematuria, unspecified: Secondary | ICD-10-CM | POA: Diagnosis not present

## 2017-12-30 DIAGNOSIS — D631 Anemia in chronic kidney disease: Secondary | ICD-10-CM | POA: Diagnosis not present

## 2017-12-30 DIAGNOSIS — D899 Disorder involving the immune mechanism, unspecified: Secondary | ICD-10-CM | POA: Diagnosis not present

## 2017-12-31 ENCOUNTER — Other Ambulatory Visit: Payer: Self-pay | Admitting: *Deleted

## 2017-12-31 ENCOUNTER — Encounter (INDEPENDENT_AMBULATORY_CARE_PROVIDER_SITE_OTHER): Payer: Self-pay

## 2017-12-31 ENCOUNTER — Encounter: Payer: Medicare Other | Admitting: Cardiology

## 2017-12-31 ENCOUNTER — Encounter: Payer: Self-pay | Admitting: Cardiology

## 2017-12-31 VITALS — BP 134/82 | HR 118 | Ht 65.0 in | Wt 214.2 lb

## 2017-12-31 DIAGNOSIS — Z9989 Dependence on other enabling machines and devices: Principal | ICD-10-CM

## 2017-12-31 DIAGNOSIS — L0291 Cutaneous abscess, unspecified: Secondary | ICD-10-CM

## 2017-12-31 DIAGNOSIS — I10 Essential (primary) hypertension: Secondary | ICD-10-CM

## 2017-12-31 DIAGNOSIS — G4733 Obstructive sleep apnea (adult) (pediatric): Secondary | ICD-10-CM

## 2017-12-31 NOTE — Addendum Note (Signed)
Addended by: Freada Bergeron on: 12/31/2017 03:55 PM   Modules accepted: Orders

## 2017-12-31 NOTE — Telephone Encounter (Signed)
Patient in the office today for a follow up visit after getting her replacement BiPAP. Patient was not seen today by the provider because she has not been using her cpap unit. Patient's compliance and usage was down because she has been in [facility] for 8 days and in rehabilitation facility for 20 days in [place]. Patient states she needs a new mask. Dr Radford Pax has ordered her a Resmed Airfit F30 full face mask for patient. Patient will come back to be seen in 10 weeks.Marland Kitchen

## 2017-12-31 NOTE — Progress Notes (Signed)
[facility] stated that they could not see patient because she is on a blood thinner and that she needs to see ortho.  Patient notified and referral sent to ortho.

## 2017-12-31 NOTE — Progress Notes (Signed)
This encounter was created in error - please disregard.

## 2018-01-01 ENCOUNTER — Ambulatory Visit: Payer: Medicare Other | Attending: Family Medicine | Admitting: Physical Therapy

## 2018-01-01 ENCOUNTER — Encounter: Payer: Self-pay | Admitting: Physical Therapy

## 2018-01-01 DIAGNOSIS — N390 Urinary tract infection, site not specified: Secondary | ICD-10-CM | POA: Diagnosis not present

## 2018-01-01 DIAGNOSIS — M109 Gout, unspecified: Secondary | ICD-10-CM | POA: Diagnosis not present

## 2018-01-01 DIAGNOSIS — R262 Difficulty in walking, not elsewhere classified: Secondary | ICD-10-CM

## 2018-01-01 DIAGNOSIS — N182 Chronic kidney disease, stage 2 (mild): Secondary | ICD-10-CM | POA: Diagnosis not present

## 2018-01-01 DIAGNOSIS — M6281 Muscle weakness (generalized): Secondary | ICD-10-CM

## 2018-01-01 DIAGNOSIS — D631 Anemia in chronic kidney disease: Secondary | ICD-10-CM | POA: Diagnosis not present

## 2018-01-01 DIAGNOSIS — Z940 Kidney transplant status: Secondary | ICD-10-CM | POA: Diagnosis not present

## 2018-01-01 NOTE — Therapy (Signed)
[place], [place], [address] Phone: [phone]   Fax:  [phone]  Physical Therapy Evaluation  Patient Details  Name: LULLA LINVILLE MRN: [id number] Date of Birth: June 16, 1946 Referring Provider: Carollee Herter   Encounter Date: 01/01/2018  PT End of Session - 01/01/18 1132    Visit Number  1    Date for PT Re-Evaluation  03/03/18    PT Start Time  1100    PT Stop Time  1150    PT Time Calculation (min)  50 min    Activity Tolerance  Patient limited by fatigue    Behavior During Therapy  [facility] for tasks assessed/performed       Past Medical History:  Diagnosis Date  . Acute diastolic (congestive) heart failure ([place]) 08/12/2017  . AKI (acute kidney injury) ([place]) 11/04/2017  . Anemia   . Arthritis    HIP  . Asthma    as a child  . Atrial flutter [facility])    s/p aflutter ablation at [facility]  . Blood transfusion    never had a reaction to blood transfusion  . Cardiomyopathy Mar 2013   Mild, EF 50-55% by Mar 2013 ECHO, diast dysfxn II  . Closed fracture of right distal radius 10/18/2016  . CMV (cytomegalovirus infection) ([place]) 10/03/2015  . Constipation    takes Miralax daily  . Depression    takes Zoloft daily  . Distal radius fracture, right   . DVT of lower extremity, bilateral ([place]) 12/21/11   "they're there now; been there for 2 wks"  . Eczema   . ESRD (end stage renal disease) on dialysis [facility]) 09/2011   07-19-2015 had Kidney transplant at [facility]  . Fractures, stress    in both feet--6 OR 7 YRS AGO--RESOLVED  . Generalized edema [id number]  . Gout    doesn't require meds   . HCAP (healthcare-associated pneumonia)    09/10/11  . Hearing difficulty   . Hypercalcemia    09/10/11  . Hyperparathyroidism ([place]) 04/07/2013  . Hypertension    takes Diltiazem daily   . Hypomagnesemia 08/02/2015  . Hypophosphatemia 11/08/2017  . Immunosuppression ([place]) 07/25/2015  . Memory changes   . Morbid  obesity ([place])   . Nonischemic dilated cardiomyopathy ([place])   . Obesity 04/07/2013  . Oligouria   . PAF (paroxysmal atrial fibrillation) (HCC)     HX OF CEREBRAL BLEED WHILE ON COUMADIN-SO PT NOT ON ANY BLOOD THINNERS NOW  . Peripheral vascular disease ([place])   . Presence of Watchman left atrial appendage closure device 10/04/2017  . Proteinuria 09/04/2016  . Psoriasis 08/07/2016  . 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 Sepsis ([place]) 11/08/2017  . Sleep apnea    uses CPAP nightly  . Stroke [facility]) 2009   denies residual, hemorrhagic now off coumadin  . Stroke due to intracerebral hemorrhage ([place]) 2009  . Suture reaction 09/04/2016    Past Surgical History:  Procedure Laterality Date  . AV FISTULA PLACEMENT  09/28/2011   Procedure: ARTERIOVENOUS (AV) FISTULA CREATION;  Surgeon: Rosetta Posner, MD;  Location: [place];  Service: Vascular;  Laterality: Left;  . CARDIAC VALVE SURGERY  1960    FOR ATRIAL SEPTAL DEFECT  . CHOLECYSTECTOMY     1980's  . COLONOSCOPY    . CYSTOSCOPY     many yrs ago  . FEMUR FRACTURE SURGERY  03/2011; 09/03/2011   right; "had 2, 2 wk apart in 2012; broke it again 08/2011 &  had OR"  . I&D EXTREMITY  09/15/2011   Procedure: IRRIGATION AND DEBRIDEMENT EXTREMITY w REMOVAL OF HARDWARE;  Surgeon: [name], MD;  Location: [place];  Service: Orthopedics;  Laterality: Right;  . INCISION AND DRAINAGE HIP  12/21/2011   Procedure: IRRIGATION AND DEBRIDEMENT HIP;  Surgeon: [name], MD;  Location: [place];  Service: Orthopedics;  Laterality: Right;  I&D RIGHT HIP WITH PLACEMENT ANTIBIOTIC SPACER  . INSERTION OF DIALYSIS CATHETER     Procedure: INSERTION OF DIALYSIS CATHETER;  Surgeon: Rosetta Posner, MD;  Location: [place];  Service: Vascular;  Laterality: Right;  . IRRIGATION AND DEBRIDEMENT ABSCESS  12/21/11   right hip  . JOINT REPLACEMENT    . KIDNEY TRANSPLANT  07/19/15  . LAPAROSCOPIC GASTRIC BANDING    . OPEN REDUCTION INTERNAL FIXATION (ORIF) DISTAL RADIAL FRACTURE  Right 10/18/2016   Procedure: OPEN REDUCTION INTERNAL FIXATION (ORIF) RIGHT DISTAL RADIAL FRACTURE;  Surgeon: Marchia Bond, MD;  Location: [place];  Service: Orthopedics;  Laterality: Right;  . TOTAL HIP ARTHROPLASTY  02/2011   right THA 02/2011, I&D/removal of hardware 09/2011,, repeat I&D Jun 2013, reimplantation R THA 03-26-2012  . TOTAL HIP REVISION  03/25/2012   Procedure: TOTAL HIP REVISION;  Surgeon: [name], MD;  [facility];  Service: Orthopedics;  Laterality: Right;  Right Total Hip Reimplantation  . VENA CAVA FILTER PLACEMENT  11/2011    There were no vitals filed for this visit.   Subjective Assessment - 01/01/18 1108    Subjective  Pateint has a complicated history of a stroke 2010 reports that she recovered with no limitation.  She had a ORIF of the right wrist last year, she then had some significant issues with A-fib since January, reported that she was in the hospital a few times since January.  She reports she was in ICU and had an 8 day hospital stay in April.  She had a cardioversion and had a "watchman" placement to prevent blood clots.  She reports that she is very weak and has not been able to walk.  She had UIT, dehydration and a URI.  She was in the rehab center at [facility].  Discharged from there May 24th, had home PT until last week.      Limitations  Lifting;Standing;Walking;House hold activities    How long can you walk comfortably?  50 feet max    Patient Stated Goals  walk better, be stronger, go up and down stairs    Currently in Pain?  Yes    Pain Score  0-No pain    Pain Location  Leg    Pain Orientation  Right    Pain Descriptors / Indicators  Aching    Pain Type  Chronic pain    Pain Onset  More than a month ago    Pain Frequency  Intermittent    Aggravating Factors   standing and walking    Pain Relieving Factors  rest and pain meds    Effect of Pain on Daily Activities  difficulty with everything, needs help with all  ADL's         [facility] PT Assessment - 01/01/18 0001      Assessment   Medical Diagnosis  weakness    Referring Provider  [place]    Onset Date/Surgical Date  12/18/17    Prior Therapy  at home and in a rehab center      Precautions   Precautions  None      Balance Screen   Has the patient fallen in the past 6 months  Yes    How many times?  5    Has the patient had a decrease in activity level because of a fear of falling?   Yes    Is the patient reluctant to leave their home because of a fear of falling?   Yes      Home Environment   Additional Comments  lives with spouse, has two step in front, 7 steps in the back, has a basement.  cooks and cleans      Prior Function   Level of Independence  Independent with community mobility with device    Vocation  Retired    Leisure  no exercise      ROM / Strength   AROM / PROM / Strength  AROM;Strength      AROM   Overall AROM Comments  right hip flexion to 45 degrees in standing due to weakness      Strength   Overall Strength Comments  right LE 3+/5, left 4-/5, left ankle 3+/5, right ankle 4-/5, knees 4-/5      Palpation   Palpation comment  tender in the right hip area      Ambulation/Gait   Gait Comments  use of a FWW, slow, shuffling steps, after TUG test she was short of breath, O2 saturation 95%, HR 130      Standardized Balance Assessment   Standardized Balance Assessment  Timed Up and Go Test      Timed Up and Go Test   Normal TUG (seconds)  30                Objective measurements completed on examination: See above findings.      [facility] Adult PT Treatment/Exercise - 01/01/18 0001      Exercises   Exercises  Knee/Hip      Knee/Hip Exercises: Aerobic   Nustep  level 2 x 5 minutes, O2 sat 94% , HR to 146               PT Short Term Goals - 01/01/18 1147      PT SHORT TERM GOAL #1   Title  independent with a beginning HEP    Time  2    Period  Weeks    Status  New         PT Long Term Goals - 01/01/18 1148      PT LONG TERM GOAL #1   Title  decrease TUG time to 20 seconds    Time  8    Period  Weeks    Status  New      PT LONG TERM GOAL #2   Title  walk 300 feet with SPC    Time  8    Period  Weeks    Status  New      PT LONG TERM GOAL #3   Title  Increase LE strengh to 4/5    Time  8    Period  Weeks    Status  New      PT LONG TERM GOAL #4   Title  be able to go up the back steps    Time  8    Period  Weeks    Status  New      PT LONG TERM GOAL #5   Title  tolerate exercise without HR above 130  Time  8    Period  Weeks    Status  New             Plan - 01/01/18 1133    Clinical Impression Statement  Patient reports that she was independent with a SPC in the community until Thanksgiving, she reports that she started having a lot of A-fib, reports 3 hospitalization and 5 falls since January.  She has had severe UTI, URI and a-fib issues, she was in a rehab center for 20 days.  She reports being weak and having difficulty walking, right hip is weak due to fractures in the past, left leg is weak due to CVA.  TUG time was 30 seconds, HR was elevated to 130 with TUG, elevated to 146 with 5 minutes at level 2 on the NuStep    History and Personal Factors relevant to plan of care:  ESRD, CVA, A-fib, CHF    Clinical Presentation  Evolving    Clinical Decision Making  Moderate    Rehab Potential  Fair    PT Frequency  2x / week    PT Duration  8 weeks    PT Treatment/Interventions  ADLs/Self Care Home Management;Gait training;Stair training;Functional mobility training;Therapeutic activities;Therapeutic exercise;Balance training;Neuromuscular re-education;Manual techniques;Patient/family education    PT Next Visit Plan  monitor HR with exercises    Consulted and Agree with Plan of Care  Patient       Patient will benefit from skilled therapeutic intervention in order to improve the following deficits and impairments:  Abnormal  gait, Decreased range of motion, Difficulty walking, Cardiopulmonary status limiting activity, Decreased endurance, Decreased activity tolerance, Decreased balance, Decreased mobility, Decreased strength, Increased edema  Visit Diagnosis: Muscle weakness (generalized) - Plan: PT plan of care cert/re-cert  Difficulty in walking, not elsewhere classified - Plan: PT plan of care cert/re-cert     Problem List Patient Active Problem List   Diagnosis Date Noted  . Abscess of right leg 12/14/2017  . Lower extremity edema 12/10/2017  . Acute kidney injury ([place]) 11/17/2017  . Acute diastolic (congestive) heart failure ([place]) 11/17/2017  . UTI (urinary tract infection) 11/03/2017  . Diarrhea 11/03/2017  . Septic shock ([place]) 11/03/2017  . Chronic respiratory failure with hypoxia ([place]) 10/03/2017  . Dyspnea 07/26/2017  . Atrial flutter ([place])   . Peripheral vascular disease ([place])   . Oligouria   . Hypertension   . Fractures, stress   . Eczema   . Distal radius fracture, right   . Depression   . Constipation   . Arthritis   . Exacerbation of asthma 04/01/2017  . Closed fracture of right distal radius Nov 04, 2016  . Deceased-donor kidney transplant recipient 09/13/2015  . Nonischemic dilated cardiomyopathy ([place])   . Skin ulceration ([place]) 05/09/2013  . Abscess of shoulder 05/15/2012  . Expected blood loss anemia 03/26/2012  . S/P right TH revision 03/25/2012  . Abscess of hip, right 12/24/2011  . DVT of lower extremity, bilateral ([place]) 12/21/2011  . History of DVT (deep vein thrombosis) 12/08/2011  . OSA on CPAP 12/08/2011  . End stage renal disease ([place]) 11/06/2011  . 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[place]) 10/24/2011  . Pseudomonas aeruginosa infection 10/24/2011  . ESRD (end stage renal disease) on dialysis ([place]) 09/14/2011  . Hypercalcemia   . Community acquired pneumonia of left upper lobe of lung ([place]) 09/10/2011  . Gout 09/10/2011  . Absence of right hip joint with antibiotic pacer  09/03/2011  . Traumatic seroma of thigh ([place]) 06/11/2011  .  Clostridium difficile colitis 06/09/2011  . Leukocytosis 06/08/2011  . Anemia 06/06/2011  . ARF (acute renal failure) ([place]) 06/06/2011  . SINUSITIS- ACUTE-NOS 09/27/2010  . DEPRESSION 11/25/2008  . PAF (paroxysmal atrial fibrillation) ([place]) 11/25/2008  . RENAL FAILURE, ACUTE 11/25/2008  . CVA 09/28/2008  . 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 WEAKNESS, LEFT SIDE OF BODY 09/21/2008  . Morbid obesity ([place]) 10/20/2007  . Stroke due to intracerebral hemorrhage ([place]) 07/17/2007  . Stroke ([place]) 07/17/2007  . Essential hypertension 07/07/2007  . Asthma 07/07/2007    Sumner Boast., PT 01/01/2018, 11:52 AM  [place] [address] [place], [place], [address] Phone: [phone]   Fax:  [phone]  Name: KRIZIA FLIGHT MRN: [id number] Date of Birth: 06-Apr-1946

## 2018-01-03 DIAGNOSIS — I4891 Unspecified atrial fibrillation: Secondary | ICD-10-CM | POA: Diagnosis not present

## 2018-01-06 DIAGNOSIS — M79604 Pain in right leg: Secondary | ICD-10-CM | POA: Diagnosis not present

## 2018-01-06 DIAGNOSIS — M25562 Pain in left knee: Secondary | ICD-10-CM | POA: Diagnosis not present

## 2018-01-07 ENCOUNTER — Encounter: Payer: Self-pay | Admitting: Physical Therapy

## 2018-01-07 ENCOUNTER — Ambulatory Visit: Payer: Medicare Other | Admitting: Physical Therapy

## 2018-01-07 DIAGNOSIS — M6281 Muscle weakness (generalized): Secondary | ICD-10-CM | POA: Diagnosis not present

## 2018-01-07 DIAGNOSIS — R262 Difficulty in walking, not elsewhere classified: Secondary | ICD-10-CM | POA: Diagnosis not present

## 2018-01-07 NOTE — Therapy (Signed)
[place], [place], [address] Phone: [phone]   Fax:  [phone]  Physical Therapy Treatment  Patient Details  Name: [name] MRN: [id number] Date of Birth: Sep 21, 1945 Referring Provider: Carollee Herter   Encounter Date: 01/07/2018  PT End of Session - 01/07/18 1339    Visit Number  2    Date for PT Re-Evaluation  03/03/18    PT Start Time  1300    PT Stop Time  1340    PT Time Calculation (min)  40 min    Activity Tolerance  Patient limited by fatigue    Behavior During Therapy  [facility] for tasks assessed/performed       Past Medical History:  Diagnosis Date  . Acute diastolic (congestive) heart failure ([place]) 08/12/2017  . AKI (acute kidney injury) ([place]) 11/04/2017  . Anemia   . Arthritis    HIP  . Asthma    as a child  . Atrial flutter [facility])    s/p aflutter ablation at [facility]  . Blood transfusion    never had a reaction to blood transfusion  . Cardiomyopathy Mar 2013   Mild, EF 50-55% by Mar 2013 ECHO, diast dysfxn II  . Closed fracture of right distal radius 10/18/2016  . CMV (cytomegalovirus infection) ([place]) 10/03/2015  . Constipation    takes Miralax daily  . Depression    takes Zoloft daily  . Distal radius fracture, right   . DVT of lower extremity, bilateral ([place]) 12/21/11   "they're there now; been there for 2 wks"  . Eczema   . ESRD (end stage renal disease) on dialysis [facility]) 09/2011   07-19-2015 had Kid[facility] at [facility]  . Fractures, stress    in both feet--6 OR 7 YRS AGO--RESOLVED  . Generalized edema [id number]  . Gout    doesn't require meds   . HCAP (healthcare-associated pneumonia)    09/10/11  . Hearing difficulty   . Hypercalcemia    09/10/11  . Hyperparathyroidism ([place]) 04/07/2013  . Hypertension    takes Diltiazem daily   . Hypomagnesemia 08/02/2015  . Hypophosphatemia 11/08/2017  . Immunosuppression ([place]) 07/25/2015  . Memory changes   . Morbid  obesity ([place])   . Nonischemic dilated cardiomyopathy ([place])   . Obesity 04/07/2013  . Oligouria   . PAF (paroxysmal atrial fibrillation) (H[facility])     HX OF CEREBRAL BLEED WHILE ON COUMADIN-SO PT NOT ON ANY BLOOD THINNERS NOW  . Peripheral vascular disease ([place])   . Presence of Watchman left atrial appendage closure device 10/04/2017  . Proteinuria 09/04/2016  . Psoriasis 08/07/2016  . 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 Sepsis ([place]) 11/08/2017  . Sleep apnea    uses CPAP nightly  . Stroke [facility]) 2009   denies residual, hemorrhagic now off coumadin  . Stroke due to intracerebral hemorrhage ([place]) 2009  . Suture reaction 09/04/2016    Past Surgical History:  Procedure Laterality Date  . AV FISTULA PLACEMENT  09/28/2011   Procedure: ARTERIOVENOUS (AV) FISTULA CREATION;  Surgeon: Rosetta Posner, MD;  Location: [place];  Service: Vascular;  Laterality: Left;  . CARDIAC VALVE SURGERY  1960    FOR ATRIAL SEPTAL DEFECT  . CHOLECYSTECTOMY     1980's  . COLONOSCOPY    . CYSTOSCOPY     many yrs ago  . FEMUR FRACTURE SURGERY  03/2011; 09/03/2011   right; "had 2, 2 wk apart in 2012; broke it again 08/2011 &  had OR"  . I&D EXTREMITY  09/15/2011   Procedure: IRRIGATION AND DEBRIDEMENT EXTREMITY w REMOVAL OF HARDWARE;  Surgeon: [name], MD;  Location: [place];  Service: Orthopedics;  Laterality: Right;  . INCISION AND DRAINAGE HIP  12/21/2011   Procedure: IRRIGATION AND DEBRIDEMENT HIP;  Surgeon: [name], MD;  Location: [place];  Service: Orthopedics;  Laterality: Right;  I&D RIGHT HIP WITH PLACEMENT ANTIBIOTIC SPACER  . INSERTION OF DIALYSIS CATHETER     Procedure: INSERTION OF DIALYSIS CATHETER;  Surgeon: Rosetta Posner, MD;  Location: [place];  Service: Vascular;  Laterality: Right;  . IRRIGATION AND DEBRIDEMENT ABSCESS  12/21/11   right hip  . JOINT REPLACEMENT    . KIDNEY TRANSPLANT  07/19/15  . LAPAROSCOPIC GASTRIC BANDING    . OPEN REDUCTION INTERNAL FIXATION (ORIF) DISTAL RADIAL FRACTURE  Right 10/18/2016   Procedure: OPEN REDUCTION INTERNAL FIXATION (ORIF) RIGHT DISTAL RADIAL FRACTURE;  Surgeon: Marchia Bond, MD;  Location: [place];  Service: Orthopedics;  Laterality: Right;  . TOTAL HIP ARTHROPLASTY  02/2011   right THA 02/2011, I&D/removal of hardware 09/2011,, repeat I&D Jun 2013, reimplantation R THA 03-26-2012  . TOTAL HIP REVISION  03/25/2012   Procedure: TOTAL HIP REVISION;  Surgeon: [name], MD;  [facility];  Service: Orthopedics;  Laterality: Right;  Right Total Hip Reimplantation  . VENA CAVA FILTER PLACEMENT  11/2011    There were no vitals filed for this visit.  Subjective Assessment - 01/07/18 1304    Subjective  Pt reports having a fall Saturday, Went to [facility] yesterday, no broken bones.     Currently in Pain?  Yes    Pain Score  5     Pain Location  Leg    Pain Orientation  Right    Pain Type  Chronic pain                       [facility] Adult PT Treatment/Exercise - 01/07/18 0001      Ambulation/Gait   Ambulation/Gait  Yes    Ambulation/Gait Assistance  4: Min guard;4: Min assist    Ambulation/Gait Assistance Details  70    Assistive device  1 person hand held assist    Gait Pattern  Step-through pattern;Decreased step length - left;Decreased hip/knee flexion - right;Shuffle    Ambulation Surface  Level;Indoor      Knee/Hip Exercises: Aerobic   Nustep  level 2 x 6 minutes, O2 sat 94% , HR to 146      Knee/Hip Exercises: Seated   Long Arc Quad  2 sets;10 reps;Strengthening;Weights    Long Arc Quad Weight  2 lbs.    Ball Squeeze  2x15    Other Seated Knee/Hip Exercises  rows green tband 2x10    Hamstring Curl  Strengthening;2 sets;10 reps green tband    Sit to Sand  2 sets;5 reps               PT Short Term Goals - 01/01/18 1147      PT SHORT TERM GOAL #1   Title  independent with a beginning HEP    Time  2    Period  Weeks    Status  New        PT Long Term Goals - 01/01/18 1148      PT  LONG TERM GOAL #1   Title  decrease TUG time to 20 seconds    Time  8    Period  Weeks    Status  New      PT LONG TERM GOAL #2   Title  walk 300 feet with SPC    Time  8    Period  Weeks    Status  New      PT LONG TERM GOAL #3   Title  Increase LE strengh to 4/5    Time  8    Period  Weeks    Status  New      PT LONG TERM GOAL #4   Title  be able to go up the back steps    Time  8    Period  Weeks    Status  New      PT LONG TERM GOAL #5   Title  tolerate exercise without HR above 130    Time  8    Period  Weeks    Status  New            Plan - 01/07/18 1340    Clinical Impression Statement  Encouragement needed to get patient to exercise. Cues with sit to stands not to allow LE to push against mat table. HR spiked occasionally up to 147bpm with activity. HHA x2 with all ambulation. Does exercises well once she gets started.     Rehab Potential  Fair    PT Frequency  2x / week    PT Duration  8 weeks    PT Treatment/Interventions  ADLs/Self Care Home Management;Gait training;Stair training;Functional mobility training;Therapeutic activities;Therapeutic exercise;Balance training;Neuromuscular re-education;Manual techniques;Patient/family education    PT Next Visit Plan  monitor HR with exercises       Patient will benefit from skilled therapeutic intervention in order to improve the following deficits and impairments:  Abnormal gait, Decreased range of motion, Difficulty walking, Cardiopulmonary status limiting activity, Decreased endurance, Decreased activity tolerance, Decreased balance, Decreased mobility, Decreased strength, Increased edema  Visit Diagnosis: Muscle weakness (generalized)  Difficulty in walking, not elsewhere classified     Problem List Patient Active Problem List   Diagnosis Date Noted  . Abscess of right leg 12/14/2017  . Lower extremity edema 12/10/2017  . Acute kidney injury ([place]) 11/17/2017  . Acute diastolic (congestive) heart  failure ([place]) 11/17/2017  . UTI (urinary tract infection) 11/03/2017  . Diarrhea 11/03/2017  . Septic shock ([place]) 11/03/2017  . Chronic respiratory failure with hypoxia ([place]) 10/03/2017  . Dyspnea 07/26/2017  . Atrial flutter ([place])   . Peripheral vascular disease ([place])   . Oligouria   . Hypertension   . Fractures, stress   . Eczema   . Distal radius fracture, right   . Depression   . Constipation   . Arthritis   . Exacerbation of asthma 04/01/2017  . Closed fracture of right distal radius 2016-11-04  . Deceased-donor kidney transplant recipient 09/13/2015  . Nonischemic dilated cardiomyopathy ([place])   . Skin ulceration ([place]) 05/09/2013  . Abscess of shoulder 05/15/2012  . Expected blood loss anemia 03/26/2012  . S/P right TH revision 03/25/2012  . Abscess of hip, right 12/24/2011  . DVT of lower extremity, bilateral ([place]) 12/21/2011  . History of DVT (deep vein thrombosis) 12/08/2011  . OSA on CPAP 12/08/2011  . End stage renal disease ([place]) 11/06/2011  . 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[place]) 10/24/2011  . Pseudomonas aeruginosa infection 10/24/2011  . ESRD (end stage renal disease) on dialysis ([place]) 09/14/2011  . Hypercalcemia   . Community acquired pneumonia of left upper  lobe of lung ([place]) 09/10/2011  . Gout 09/10/2011  . Absence of right hip joint with antibiotic pacer 09/03/2011  . Traumatic seroma of thigh ([place]) 06/11/2011  . Clostridium difficile colitis 06/09/2011  . Leukocytosis 06/08/2011  . Anemia 06/06/2011  . ARF (acute renal failure) ([place]) 06/06/2011  . SINUSITIS- ACUTE-NOS 09/27/2010  . DEPRESSION 11/25/2008  . PAF (paroxysmal atrial fibrillation) ([place]) 11/25/2008  . RENAL FAILURE, ACUTE 11/25/2008  . CVA 09/28/2008  . 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 WEAKNESS, LEFT SIDE OF BODY 09/21/2008  . Morbid obesity ([place]) 10/20/2007  . Stroke due to intracerebral hemorrhage ([place]) 07/17/2007  . Stroke ([place]) 07/17/2007  . Essential  hypertension 07/07/2007  . Asthma 07/07/2007    Scot Jun, PTA 01/07/2018, 1:44 PM  [place] [address] [place], [place], [address] Phone: [phone]   Fax:  [phone]  Name: [name] MRN: [id number] Date of Birth: 1946-01-30

## 2018-01-09 ENCOUNTER — Encounter: Payer: Self-pay | Admitting: Physical Therapy

## 2018-01-09 ENCOUNTER — Ambulatory Visit: Payer: Medicare Other | Admitting: Physical Therapy

## 2018-01-09 ENCOUNTER — Telehealth: Payer: Self-pay | Admitting: *Deleted

## 2018-01-09 DIAGNOSIS — R262 Difficulty in walking, not elsewhere classified: Secondary | ICD-10-CM | POA: Diagnosis not present

## 2018-01-09 DIAGNOSIS — M6281 Muscle weakness (generalized): Secondary | ICD-10-CM

## 2018-01-09 NOTE — Telephone Encounter (Addendum)
Late Entry:  Per Dr Radford Pax, order a Resmed Air fit F-30 full face mask.  Order faxed to [facility]

## 2018-01-09 NOTE — Therapy (Signed)
[facility] [place], [place], [address] Phone: [phone]   Fax:  [phone]  Physical Therapy Treatment  Patient Details  Name: [name] MRN: [id number] Date of Birth: 12/18/1945 Referring Provider: Carollee Herter   Encounter Date: 01/09/2018  PT End of Session - 01/09/18 1347    Visit Number  3    Date for PT Re-Evaluation  03/03/18    PT Start Time  1300    PT Stop Time  1347    PT Time Calculation (min)  47 min    Activity Tolerance  Patient tolerated treatment well;Patient limited by fatigue    Behavior During Therapy  [facility] for tasks assessed/performed       Past Medical History:  Diagnosis Date  . Acute diastolic (congestive) heart failure ([place]) 08/12/2017  . AKI (acute kidney injury) ([place]) 11/04/2017  . Anemia   . Arthritis    HIP  . Asthma    as a child  . Atrial flutter [facility])    s/p aflutter ablation at [facility]  . Blood transfusion    never had a reaction to blood transfusion  . Cardiomyopathy Mar 2013   Mild, EF 50-55% by Mar 2013 ECHO, diast dysfxn II  . Closed fracture of right distal radius 10/18/2016  . CMV (cytomegalovirus infection) ([place]) 10/03/2015  . Constipation    takes Miralax daily  . Depression    takes Zoloft daily  . Distal radius fracture, right   . DVT of lower extremity, bilateral ([place]) 12/21/11   "they're there now; been there for 2 wks"  . Eczema   . ESRD (end stage renal disease) on dialysis [facility]) 09/2011   07-19-2015 had Kidney transplant at [facility]  . Fractures, stress    in both feet--6 OR 7 YRS AGO--RESOLVED  . Generalized edema [id number]  . Gout    doesn't require meds   . HCAP (healthcare-associated pneumonia)    09/10/11  . Hearing difficulty   . Hypercalcemia    09/10/11  . Hyperparathyroidism ([place]) 04/07/2013  . Hypertension    takes Diltiazem daily   . Hypomagnesemia 08/02/2015  . Hypophosphatemia 11/08/2017  . Immunosuppression ([place]) 07/25/2015   . Memory changes   . Morbid obesity ([place])   . Nonischemic dilated cardiomyopathy ([place])   . Obesity 04/07/2013  . Oligouria   . PAF (paroxysmal atrial fibrillation) (H[facility])     HX OF CEREBRAL BLEED WHILE ON COUMADIN-SO PT NOT ON ANY BLOOD THINNERS NOW  . Peripheral vascular disease ([place])   . Presence of Watchman left atrial appendage closure device 10/04/2017  . Proteinuria 09/04/2016  . Psoriasis 08/07/2016  . 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 Sepsis ([place]) 11/08/2017  . Sleep apnea    uses CPAP nightly  . Stroke [facility]) 2009   denies residual, hemorrhagic now off coumadin  . Stroke due to intracerebral hemorrhage ([place]) 2009  . Suture reaction 09/04/2016    Past Surgical History:  Procedure Laterality Date  . AV FISTULA PLACEMENT  09/28/2011   Procedure: ARTERIOVENOUS (AV) FISTULA CREATION;  Surgeon: Rosetta Posner, MD;  Location: [place];  Service: Vascular;  Laterality: Left;  . CARDIAC VALVE SURGERY  1960    FOR ATRIAL SEPTAL DEFECT  . CHOLECYSTECTOMY     1980's  . COLONOSCOPY    . CYSTOSCOPY     many yrs ago  . FEMUR FRACTURE SURGERY  03/2011; 09/03/2011   right; "had 2, 2 wk apart in 2012; broke  it again 08/2011 & had OR"  . I&D EXTREMITY  09/15/2011   Procedure: IRRIGATION AND DEBRIDEMENT EXTREMITY w REMOVAL OF HARDWARE;  Surgeon: [name], MD;  Location: [place];  Service: Orthopedics;  Laterality: Right;  . INCISION AND DRAINAGE HIP  12/21/2011   Procedure: IRRIGATION AND DEBRIDEMENT HIP;  Surgeon: [name], MD;  Location: [place];  Service: Orthopedics;  Laterality: Right;  I&D RIGHT HIP WITH PLACEMENT ANTIBIOTIC SPACER  . INSERTION OF DIALYSIS CATHETER     Procedure: INSERTION OF DIALYSIS CATHETER;  Surgeon: Rosetta Posner, MD;  Location: [place];  Service: Vascular;  Laterality: Right;  . IRRIGATION AND DEBRIDEMENT ABSCESS  12/21/11   right hip  . JOINT REPLACEMENT    . KIDNEY TRANSPLANT  07/19/15  . LAPAROSCOPIC GASTRIC BANDING    . OPEN REDUCTION INTERNAL FIXATION  (ORIF) DISTAL RADIAL FRACTURE Right 10/18/2016   Procedure: OPEN REDUCTION INTERNAL FIXATION (ORIF) RIGHT DISTAL RADIAL FRACTURE;  Surgeon: Marchia Bond, MD;  Location: [place];  Service: Orthopedics;  Laterality: Right;  . TOTAL HIP ARTHROPLASTY  02/2011   right THA 02/2011, I&D/removal of hardware 09/2011,, repeat I&D Jun 2013, reimplantation R THA 03-26-2012  . TOTAL HIP REVISION  03/25/2012   Procedure: TOTAL HIP REVISION;  Surgeon: [name], MD;  [facility];  Service: Orthopedics;  Laterality: Right;  Right Total Hip Reimplantation  . VENA CAVA FILTER PLACEMENT  11/2011    There were no vitals filed for this visit.  Subjective Assessment - 01/09/18 1304    Subjective  "terrible, Im not feeling my best"     Currently in Pain?  Yes    Pain Score  4     Pain Location  Leg    Pain Orientation  Right;Lateral                       [facility] Adult PT Treatment/Exercise - 01/09/18 0001      Ambulation/Gait   Ambulation/Gait  Yes    Ambulation/Gait Assistance Details  80    Assistive device  1 person hand held assist    Gait Pattern  Step-through pattern;Decreased step length - left;Decreased hip/knee flexion - right;Shuffle    Ambulation Surface  Level;Indoor      High Level Balance   High Level Balance Activities  Side stepping HHA x2      Knee/Hip Exercises: Aerobic   Nustep  level 2 x 6 minutes, O2 sat 97% , HR to 150      Knee/Hip Exercises: Standing   Other Standing Knee Exercises  standing march x5, x10       Knee/Hip Exercises: Seated   Long Arc Quad  2 sets;10 reps;Strengthening;Weights    Long Arc Quad Weight  2 lbs.    Ball Squeeze  2x15    Other Seated Knee/Hip Exercises  rows green tband 2x10    Hamstring Curl  Strengthening;2 sets;10 reps    Hamstring Limitations  green tband    Sit to Sand  2 sets;5 reps               PT Short Term Goals - 01/01/18 1147      PT SHORT TERM GOAL #1   Title  independent with a  beginning HEP    Time  2    Period  Weeks    Status  New        PT Long Term Goals - 01/01/18 1148  PT LONG TERM GOAL #1   Title  decrease TUG time to 20 seconds    Time  8    Period  Weeks    Status  New      PT LONG TERM GOAL #2   Title  walk 300 feet with SPC    Time  8    Period  Weeks    Status  New      PT LONG TERM GOAL #3   Title  Increase LE strengh to 4/5    Time  8    Period  Weeks    Status  New      PT LONG TERM GOAL #4   Title  be able to go up the back steps    Time  8    Period  Weeks    Status  New      PT LONG TERM GOAL #5   Title  tolerate exercise without HR above 130    Time  8    Period  Weeks    Status  New            Plan - 01/09/18 1347    Clinical Impression Statement  Pt displayed and increase in activity tolerance. HHA x2 needed for all standing interventions. Encouragement needed to exercises but pt more willing to do. No reports of increase pain. Hr got up to 150 bpm after aerobic warm up.    Rehab Potential  Fair    PT Frequency  2x / week    PT Duration  8 weeks    PT Treatment/Interventions  ADLs/Self Care Home Management;Gait training;Stair training;Functional mobility training;Therapeutic activities;Therapeutic exercise;Balance training;Neuromuscular re-education;Manual techniques;Patient/family education    PT Next Visit Plan  monitor HR with exercises       Patient will benefit from skilled therapeutic intervention in order to improve the following deficits and impairments:  Abnormal gait, Decreased range of motion, Difficulty walking, Cardiopulmonary status limiting activity, Decreased endurance, Decreased activity tolerance, Decreased balance, Decreased mobility, Decreased strength, Increased edema  Visit Diagnosis: Difficulty in walking, not elsewhere classified  Muscle weakness (generalized)     Problem List Patient Active Problem List   Diagnosis Date Noted  . Abscess of right leg 12/14/2017  . Lower  extremity edema 12/10/2017  . Acute kidney injury ([place]) 11/17/2017  . Acute diastolic (congestive) heart failure ([place]) 11/17/2017  . UTI (urinary tract infection) 11/03/2017  . Diarrhea 11/03/2017  . Septic shock ([place]) 11/03/2017  . Chronic respiratory failure with hypoxia ([place]) 10/03/2017  . Dyspnea 07/26/2017  . Atrial flutter ([place])   . Peripheral vascular disease ([place])   . Oligouria   . Hypertension   . Fractures, stress   . Eczema   . Distal radius fracture, right   . Depression   . Constipation   . Arthritis   . Exacerbation of asthma 04/01/2017  . Closed fracture of right distal radius Oct 31, 2016  . Deceased-donor kidney transplant recipient 09/13/2015  . Nonischemic dilated cardiomyopathy ([place])   . Skin ulceration ([place]) 05/09/2013  . Abscess of shoulder 05/15/2012  . Expected blood loss anemia 03/26/2012  . S/P right TH revision 03/25/2012  . Abscess of hip, right 12/24/2011  . DVT of lower extremity, bilateral ([place]) 12/21/2011  . History of DVT (deep vein thrombosis) 12/08/2011  . OSA on CPAP 12/08/2011  . End stage renal disease ([place]) 11/06/2011  . 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[place]) 10/24/2011  . Pseudomonas aeruginosa infection 10/24/2011  . ESRD (end stage renal  disease) on dialysis ([place]) 09/14/2011  . Hypercalcemia   . Community acquired pneumonia of left upper lobe of lung ([place]) 09/10/2011  . Gout 09/10/2011  . Absence of right hip joint with antibiotic pacer 09/03/2011  . Traumatic seroma of thigh ([place]) 06/11/2011  . Clostridium difficile colitis 06/09/2011  . Leukocytosis 06/08/2011  . Anemia 06/06/2011  . ARF (acute renal failure) ([place]) 06/06/2011  . SINUSITIS- ACUTE-NOS 09/27/2010  . DEPRESSION 11/25/2008  . PAF (paroxysmal atrial fibrillation) ([place]) 11/25/2008  . RENAL FAILURE, ACUTE 11/25/2008  . CVA 09/28/2008  . 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 WEAKNESS, LEFT SIDE OF BODY 09/21/2008  . Morbid obesity ([place]) 10/20/2007  .  Stroke due to intracerebral hemorrhage ([place]) 07/17/2007  . Stroke ([place]) 07/17/2007  . Essential hypertension 07/07/2007  . Asthma 07/07/2007    Scot Jun, PTA 01/09/2018, 1:50 PM  [place], [place], [address] Phone: [phone]   Fax:  [phone]  Name: [name] MRN: [id number] Date of Birth: May 30, 1946

## 2018-01-13 ENCOUNTER — Ambulatory Visit: Payer: Medicare Other | Attending: Family Medicine | Admitting: Physical Therapy

## 2018-01-13 ENCOUNTER — Encounter: Payer: Self-pay | Admitting: Physical Therapy

## 2018-01-13 DIAGNOSIS — R262 Difficulty in walking, not elsewhere classified: Secondary | ICD-10-CM | POA: Diagnosis not present

## 2018-01-13 DIAGNOSIS — M6281 Muscle weakness (generalized): Secondary | ICD-10-CM | POA: Insufficient documentation

## 2018-01-13 NOTE — Therapy (Signed)
[place], [place], [address] Phone: [phone]   Fax:  [phone]  Physical Therapy Treatment  Patient Details  Name: [name] MRN: [id number] Date of Birth: 15-May-1946 Referring Provider: Carollee Herter   Encounter Date: 01/13/2018  PT End of Session - 01/13/18 0927    Visit Number  4    Date for PT Re-Evaluation  03/03/18    PT Start Time  0845    PT Stop Time  0928    PT Time Calculation (min)  43 min    Activity Tolerance  Patient tolerated treatment well;Patient limited by fatigue    Behavior During Therapy  [facility] for tasks assessed/performed       Past Medical History:  Diagnosis Date  . Acute diastolic (congestive) heart failure ([place]) 08/12/2017  . AKI (acute kidney injury) ([place]) 11/04/2017  . Anemia   . Arthritis    HIP  . Asthma    as a child  . Atrial flutter [facility])    s/p aflutter ablation at [facility]  . Blood transfusion    never had a reaction to blood transfusion  . Cardiomyopathy Mar 2013   Mild, EF 50-55% by Mar 2013 ECHO, diast dysfxn II  . Closed fracture of right distal radius 10/18/2016  . CMV (cytomegalovirus infection) ([place]) 10/03/2015  . Constipation    takes Miralax daily  . Depression    takes Zoloft daily  . Distal radius fracture, right   . DVT of lower extremity, bilateral ([place]) 12/21/11   "they're there now; been there for 2 wks"  . Eczema   . ESRD (end stage renal disease) on dialysis [facility]) 09/2011   07-19-2015 had Kidney transplant at [facility]  . Fractures, stress    in both feet--6 OR 7 YRS AGO--RESOLVED  . Generalized edema [id number]  . Gout    doesn't require meds   . HCAP (healthcare-associated pneumonia)    09/10/11  . Hearing difficulty   . Hypercalcemia    09/10/11  . Hyperparathyroidism ([place]) 04/07/2013  . Hypertension    takes Diltiazem daily   . Hypomagnesemia 08/02/2015  . Hypophosphatemia 11/08/2017  . Immunosuppression ([place]) 07/25/2015   . Memory changes   . Morbid obesity ([place])   . Nonischemic dilated cardiomyopathy ([place])   . Obesity 04/07/2013  . Oligouria   . PAF (paroxysmal atrial fibrillation) ([facility])     HX OF CEREBRAL BLEED WHILE ON COUMADIN-SO PT NOT ON ANY BLOOD THINNERS NOW  . Peripheral vascular disease ([place])   . Presence of Watchman left atrial appendage closure device 10/04/2017  . Proteinuria 09/04/2016  . Psoriasis 08/07/2016  . 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 Sepsis ([place]) 11/08/2017  . Sleep apnea    uses CPAP nightly  . Stroke [facility]) 2009   denies residual, hemorrhagic now off coumadin  . Stroke due to intracerebral hemorrhage ([place]) 2009  . Suture reaction 09/04/2016    Past Surgical History:  Procedure Laterality Date  . AV FISTULA PLACEMENT  09/28/2011   Procedure: ARTERIOVENOUS (AV) FISTULA CREATION;  Surgeon: Rosetta Posner, MD;  Location: [place];  Service: Vascular;  Laterality: Left;  . CARDIAC VALVE SURGERY  1960    FOR ATRIAL SEPTAL DEFECT  . CHOLECYSTECTOMY     1980's  . COLONOSCOPY    . CYSTOSCOPY     many yrs ago  . FEMUR FRACTURE SURGERY  03/2011; 09/03/2011   right; "had 2, 2 wk apart in 2012; broke  it again 08/2011 & had OR"  . I&D EXTREMITY  09/15/2011   Procedure: IRRIGATION AND DEBRIDEMENT EXTREMITY w REMOVAL OF HARDWARE;  Surgeon: [name], MD;  Location: [place];  Service: Orthopedics;  Laterality: Right;  . INCISION AND DRAINAGE HIP  12/21/2011   Procedure: IRRIGATION AND DEBRIDEMENT HIP;  Surgeon: [name], MD;  Location: [place];  Service: Orthopedics;  Laterality: Right;  I&D RIGHT HIP WITH PLACEMENT ANTIBIOTIC SPACER  . INSERTION OF DIALYSIS CATHETER     Procedure: INSERTION OF DIALYSIS CATHETER;  Surgeon: Rosetta Posner, MD;  Location: [place];  Service: Vascular;  Laterality: Right;  . IRRIGATION AND DEBRIDEMENT ABSCESS  12/21/11   right hip  . JOINT REPLACEMENT    . KIDNEY TRANSPLANT  07/19/15  . LAPAROSCOPIC GASTRIC BANDING    . OPEN REDUCTION INTERNAL FIXATION  (ORIF) DISTAL RADIAL FRACTURE Right 10/18/2016   Procedure: OPEN REDUCTION INTERNAL FIXATION (ORIF) RIGHT DISTAL RADIAL FRACTURE;  Surgeon: Marchia Bond, MD;  Location: [place];  Service: Orthopedics;  Laterality: Right;  . TOTAL HIP ARTHROPLASTY  02/2011   right THA 02/2011, I&D/removal of hardware 09/2011,, repeat I&D Jun 2013, reimplantation R THA 03-26-2012  . TOTAL HIP REVISION  03/25/2012   Procedure: TOTAL HIP REVISION;  Surgeon: [name], MD;  [facility];  Service: Orthopedics;  Laterality: Right;  Right Total Hip Reimplantation  . VENA CAVA FILTER PLACEMENT  11/2011    There were no vitals filed for this visit.  Subjective Assessment - 01/13/18 0849    Subjective  "I don't know, I really don't know, It is improving slowly" Pt reports that's she is nauseated this morning.     Currently in Pain?  Yes    Pain Score  7     Pain Location  Leg    Pain Orientation  Right                       [facility] Adult PT Treatment/Exercise - 01/13/18 0001      Ambulation/Gait   Ambulation/Gait  Yes    Ambulation/Gait Assistance Details  60    Assistive device  1 person hand held assist    Gait Pattern  Step-through pattern;Decreased step length - left;Decreased hip/knee flexion - right;Shuffle    Ambulation Surface  Level;Indoor    Gait Comments  2nd trial x70 ft      Knee/Hip Exercises: Aerobic   Nustep  level 3 x 7 minutes, O2 sat 91% , HR to 144      Knee/Hip Exercises: Standing   Other Standing Knee Exercises  Alt box taps 4in 2x10       Knee/Hip Exercises: Seated   Long Arc Quad  Strengthening;Weights;1 set;15 reps    Long Arc Quad Weight  2 lbs.    Other Seated Knee/Hip Exercises  ankle pimps green x20     Hamstring Curl  Strengthening;2 sets;10 reps    Hamstring Limitations  green tband    Sit to Sand  2 sets;10 reps;with UE support from blue chair                PT Short Term Goals - 01/01/18 1147      PT SHORT TERM GOAL #1    Title  independent with a beginning HEP    Time  2    Period  Weeks    Status  New        PT Long Term Goals -  01/13/18 0928      PT LONG TERM GOAL #4   Title  be able to go up the back steps    Status  On-going      PT LONG TERM GOAL #5   Title  tolerate exercise without HR above 130    Status  On-going            Plan - 01/13/18 0928    Clinical Impression Statement  Pt HR continues to elevate a lot with a activity. HR reach 151 with today's interventions. CGA needed with alternating box taps. She does reports some R leg pain but tolerable. Frequent rest throughout session     Rehab Potential  Fair    PT Frequency  2x / week    PT Duration  8 weeks    PT Treatment/Interventions  ADLs/Self Care Home Management;Gait training;Stair training;Functional mobility training;Therapeutic activities;Therapeutic exercise;Balance training;Neuromuscular re-education;Manual techniques;Patient/family education    PT Next Visit Plan  monitor HR with exercises       Patient will benefit from skilled therapeutic intervention in order to improve the following deficits and impairments:  Abnormal gait, Decreased range of motion, Difficulty walking, Cardiopulmonary status limiting activity, Decreased endurance, Decreased activity tolerance, Decreased balance, Decreased mobility, Decreased strength, Increased edema  Visit Diagnosis: Muscle weakness (generalized)  Difficulty in walking, not elsewhere classified     Problem List Patient Active Problem List   Diagnosis Date Noted  . Abscess of right leg 12/14/2017  . Lower extremity edema 12/10/2017  . Acute kidney injury ([place]) 11/17/2017  . Acute diastolic (congestive) heart failure ([place]) 11/17/2017  . UTI (urinary tract infection) 11/03/2017  . Diarrhea 11/03/2017  . Septic shock ([place]) 11/03/2017  . Chronic respiratory failure with hypoxia ([place]) 10/03/2017  . Dyspnea 07/26/2017  . Atrial flutter ([place])   . Peripheral vascular  disease ([place])   . Oligouria   . Hypertension   . Fractures, stress   . Eczema   . Distal radius fracture, right   . Depression   . Constipation   . Arthritis   . Exacerbation of asthma 04/01/2017  . Closed fracture of right distal radius 10/20/2016  . Deceased-donor kidney transplant recipient 09/13/2015  . Nonischemic dilated cardiomyopathy ([place])   . Skin ulceration ([place]) 05/09/2013  . Abscess of shoulder 05/15/2012  . Expected blood loss anemia 03/26/2012  . S/P right TH revision 03/25/2012  . Abscess of hip, right 12/24/2011  . DVT of lower extremity, bilateral ([place]) 12/21/2011  . History of DVT (deep vein thrombosis) 12/08/2011  . OSA on CPAP 12/08/2011  . End stage renal disease ([place]) 11/06/2011  . 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[place]) 10/24/2011  . Pseudomonas aeruginosa infection 10/24/2011  . ESRD (end stage renal disease) on dialysis ([place]) 09/14/2011  . Hypercalcemia   . Community acquired pneumonia of left upper lobe of lung ([place]) 09/10/2011  . Gout 09/10/2011  . Absence of right hip joint with antibiotic pacer 09/03/2011  . Traumatic seroma of thigh ([place]) 06/11/2011  . Clostridium difficile colitis 06/09/2011  . Leukocytosis 06/08/2011  . Anemia 06/06/2011  . ARF (acute renal failure) ([place]) 06/06/2011  . SINUSITIS- ACUTE-NOS 09/27/2010  . DEPRESSION 11/25/2008  . PAF (paroxysmal atrial fibrillation) ([place]) 11/25/2008  . RENAL FAILURE, ACUTE 11/25/2008  . CVA 09/28/2008  . 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 WEAKNESS, LEFT SIDE OF BODY 09/21/2008  . Morbid obesity ([place]) 10/20/2007  . Stroke due to intracerebral hemorrhage ([place]) 07/17/2007  . Stroke ([place]) 07/17/2007  . Essential  hypertension 07/07/2007  . Asthma 07/07/2007    Scot Jun, PTA 01/13/2018, 9:30 AM  [place], [place], [address] Phone: [phone]   Fax:  [phone]  Name: OLIANA GOWENS MRN: [id number] Date of Birth: 02-24-1946

## 2018-01-15 ENCOUNTER — Encounter: Payer: Self-pay | Admitting: Physical Therapy

## 2018-01-15 ENCOUNTER — Ambulatory Visit: Payer: Medicare Other | Admitting: Physical Therapy

## 2018-01-15 DIAGNOSIS — M6281 Muscle weakness (generalized): Secondary | ICD-10-CM | POA: Diagnosis not present

## 2018-01-15 DIAGNOSIS — R262 Difficulty in walking, not elsewhere classified: Secondary | ICD-10-CM

## 2018-01-15 NOTE — Therapy (Signed)
[address] [place], [place], [address] Phone: [phone]   Fax:  [phone]  Physical Therapy Treatment  Patient Details  Name: [name] MRN: [id number] Date of Birth: 1945/10/11 Referring Provider: Carollee Herter   Encounter Date: 01/15/2018  PT End of Session - 01/15/18 0942    Visit Number  5    Date for PT Re-Evaluation  03/03/18    PT Start Time  0845    PT Stop Time  0931    PT Time Calculation (min)  46 min    Activity Tolerance  Patient tolerated treatment well;Patient limited by fatigue    Behavior During Therapy  [facility] for tasks assessed/performed       Past Medical History:  Diagnosis Date  . Acute diastolic (congestive) heart failure ([place]) 08/12/2017  . AKI (acute kidney injury) ([place]) 11/04/2017  . Anemia   . Arthritis    HIP  . Asthma    as a child  . Atrial flutter [facility])    s/p aflutter ablation at [facility]  . Blood transfusion    never had a reaction to blood transfusion  . Cardiomyopathy Mar 2013   Mild, EF 50-55% by Mar 2013 ECHO, diast dysfxn II  . Closed fracture of right distal radius 10/18/2016  . CMV (cytomegalovirus infection) ([place]) 10/03/2015  . Constipation    takes Miralax daily  . Depression    takes Zoloft daily  . Distal radius fracture, right   . DVT of lower extremity, bilateral ([place]) 12/21/11   "they're there now; been there for 2 wks"  . Eczema   . ESRD (end stage renal disease) on dialysis [facility]) 09/2011   07-19-2015 had Kidney transplant at [facility]  . Fractures, stress    in both feet--6 OR 7 YRS AGO--RESOLVED  . Generalized edema [id number]  . Gout    doesn't require meds   . HCAP (healthcare-associated pneumonia)    09/10/11  . Hearing difficulty   . Hypercalcemia    09/10/11  . Hyperparathyroidism ([place]) 04/07/2013  . Hypertension    takes Diltiazem daily   . Hypomagnesemia 08/02/2015  . Hypophosphatemia 11/08/2017  . Immunosuppression ([place]) 07/25/2015   . Memory changes   . Morbid obesity ([place])   . Nonischemic dilated cardiomyopathy ([place])   . Obesity 04/07/2013  . Oligouria   . PAF (paroxysmal atrial fibrillation) (HCC)     HX OF CEREBRAL BLEED WHILE ON COUMADIN-SO PT NOT ON ANY BLOOD THINNERS NOW  . Peripheral vascular disease ([place])   . Presence of Watchman left atrial appendage closure device 10/04/2017  . Proteinuria 09/04/2016  . Psoriasis 08/07/2016  . 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 Sepsis ([place]) 11/08/2017  . Sleep apnea    uses CPAP nightly  . Stroke [facility]) 2009   denies residual, hemorrhagic now off coumadin  . Stroke due to intracerebral hemorrhage ([place]) 2009  . Suture reaction 09/04/2016    Past Surgical History:  Procedure Laterality Date  . AV FISTULA PLACEMENT  09/28/2011   Procedure: ARTERIOVENOUS (AV) FISTULA CREATION;  Surgeon: Rosetta Posner, MD;  Location: [place];  Service: Vascular;  Laterality: Left;  . CARDIAC VALVE SURGERY  1960    FOR ATRIAL SEPTAL DEFECT  . CHOLECYSTECTOMY     1980's  . COLONOSCOPY    . CYSTOSCOPY     many yrs ago  . FEMUR FRACTURE SURGERY  03/2011; 09/03/2011   right; "had 2, 2 wk apart in 2012; broke  it again 08/2011 & had OR"  . I&D EXTREMITY  09/15/2011   Procedure: IRRIGATION AND DEBRIDEMENT EXTREMITY w REMOVAL OF HARDWARE;  Surgeon: [name], MD;  Location: [place];  Service: Orthopedics;  Laterality: Right;  . INCISION AND DRAINAGE HIP  12/21/2011   Procedure: IRRIGATION AND DEBRIDEMENT HIP;  Surgeon: [name], MD;  Location: [place];  Service: Orthopedics;  Laterality: Right;  I&D RIGHT HIP WITH PLACEMENT ANTIBIOTIC SPACER  . INSERTION OF DIALYSIS CATHETER     Procedure: INSERTION OF DIALYSIS CATHETER;  Surgeon: Rosetta Posner, MD;  Location: [place];  Service: Vascular;  Laterality: Right;  . IRRIGATION AND DEBRIDEMENT ABSCESS  12/21/11   right hip  . JOINT REPLACEMENT    . KIDNEY TRANSPLANT  07/19/15  . LAPAROSCOPIC GASTRIC BANDING    . OPEN REDUCTION INTERNAL FIXATION  (ORIF) DISTAL RADIAL FRACTURE Right 10/18/2016   Procedure: OPEN REDUCTION INTERNAL FIXATION (ORIF) RIGHT DISTAL RADIAL FRACTURE;  Surgeon: Marchia Bond, MD;  Location: [place];  Service: Orthopedics;  Laterality: Right;  . TOTAL HIP ARTHROPLASTY  02/2011   right THA 02/2011, I&D/removal of hardware 09/2011,, repeat I&D Jun 2013, reimplantation R THA 03-26-2012  . TOTAL HIP REVISION  03/25/2012   Procedure: TOTAL HIP REVISION;  Surgeon: [name], MD;  [facility];  Service: Orthopedics;  Laterality: Right;  Right Total Hip Reimplantation  . VENA CAVA FILTER PLACEMENT  11/2011    There were no vitals filed for this visit.  Subjective Assessment - 01/15/18 0850    Subjective  "I just don't feel good" "I should have took my breathing treatment"    Currently in Pain?  Yes    Pain Score  5     Pain Location  Leg    Pain Orientation  Right                       [facility] Adult PT Treatment/Exercise - 01/15/18 0001      Ambulation/Gait   Ambulation/Gait  Yes    Ambulation/Gait Assistance  4: Min guard;4: Min assist    Ambulation/Gait Assistance Details  140    Assistive device  Rolling walker    Gait Pattern  Step-through pattern;Decreased step length - left;Decreased hip/knee flexion - right;Shuffle    Ambulation Surface  Unlevel;Level;Indoor    Gait Comments  two seated rest breaks       Knee/Hip Exercises: Aerobic   Nustep  level 3 x 7 minutes, O2 sat 91% , HR to 147 stop at 5 to rest       Knee/Hip Exercises: Seated   Long Arc Quad  Strengthening;Weights;1 set;10 reps    Other Seated Knee/Hip Exercises  rows green tband 2x15    Hamstring Curl  Strengthening;2 sets;15 reps    Hamstring Limitations  green tband    Sit to Sand  2 sets;with UE support 8 reps                PT Short Term Goals - 01/01/18 1147      PT SHORT TERM GOAL #1   Title  independent with a beginning HEP    Time  2    Period  Weeks    Status  New        PT  Long Term Goals - 01/13/18 4540      PT LONG TERM GOAL #4   Title  be able to go up the back steps  Status  On-going      PT LONG TERM GOAL #5   Title  tolerate exercise without HR above 130    Status  On-going            Plan - 01/15/18 0943    Clinical Impression Statement  Pt abe to complete all of today's exercises. Postural cues needed when doing seated Tband rows. Pt becomes fatigue with activities requiring multiple rest breaks. Cues to keep shoulders down and not put a lot of weight through her UE when using RW.  (Pended)     Rehab Potential  Fair  (Pended)     PT Frequency  2x / week  (Pended)     PT Duration  8 weeks  (Pended)     PT Treatment/Interventions  ADLs/Self Care Home Management;Gait training;Stair training;Functional mobility training;Therapeutic activities;Therapeutic exercise;Balance training;Neuromuscular re-education;Manual techniques;Patient/family education  (Pended)     PT Next Visit Plan  monitor HR with exercises       Patient will benefit from skilled therapeutic intervention in order to improve the following deficits and impairments:  (P) Abnormal gait, Decreased range of motion, Difficulty walking, Cardiopulmonary status limiting activity, Decreased endurance, Decreased activity tolerance, Decreased balance, Decreased mobility, Decreased strength, Increased edema  Visit Diagnosis: Difficulty in walking, not elsewhere classified  Muscle weakness (generalized)     Problem List Patient Active Problem List   Diagnosis Date Noted  . Abscess of right leg 12/14/2017  . Lower extremity edema 12/10/2017  . Acute kidney injury ([place]) 11/17/2017  . Acute diastolic (congestive) heart failure ([place]) 11/17/2017  . UTI (urinary tract infection) 11/03/2017  . Diarrhea 11/03/2017  . Septic shock ([place]) 11/03/2017  . Chronic respiratory failure with hypoxia ([place]) 10/03/2017  . Dyspnea 07/26/2017  . Atrial flutter ([place])   . Peripheral vascular disease  ([place])   . Oligouria   . Hypertension   . Fractures, stress   . Eczema   . Distal radius fracture, right   . Depression   . Constipation   . Arthritis   . Exacerbation of asthma 04/01/2017  . Closed fracture of right distal radius 11-11-2016  . Deceased-donor kidney transplant recipient 09/13/2015  . Nonischemic dilated cardiomyopathy ([place])   . Skin ulceration ([place]) 05/09/2013  . Abscess of shoulder 05/15/2012  . Expected blood loss anemia 03/26/2012  . S/P right TH revision 03/25/2012  . Abscess of hip, right 12/24/2011  . DVT of lower extremity, bilateral ([place]) 12/21/2011  . History of DVT (deep vein thrombosis) 12/08/2011  . OSA on CPAP 12/08/2011  . End stage renal disease ([place]) 11/06/2011  . 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[place]) 10/24/2011  . Pseudomonas aeruginosa infection 10/24/2011  . ESRD (end stage renal disease) on dialysis ([place]) 09/14/2011  . Hypercalcemia   . Community acquired pneumonia of left upper lobe of lung ([place]) 09/10/2011  . Gout 09/10/2011  . Absence of right hip joint with antibiotic pacer 09/03/2011  . Traumatic seroma of thigh ([place]) 06/11/2011  . Clostridium difficile colitis 06/09/2011  . Leukocytosis 06/08/2011  . Anemia 06/06/2011  . ARF (acute renal failure) ([place]) 06/06/2011  . SINUSITIS- ACUTE-NOS 09/27/2010  . DEPRESSION 11/25/2008  . PAF (paroxysmal atrial fibrillation) ([place]) 11/25/2008  . RENAL FAILURE, ACUTE 11/25/2008  . CVA 09/28/2008  . 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 WEAKNESS, LEFT SIDE OF BODY 09/21/2008  . Morbid obesity ([place]) 10/20/2007  . Stroke due to intracerebral hemorrhage ([place]) 07/17/2007  . Stroke ([place]) 07/17/2007  . Essential hypertension 07/07/2007  .  Asthma 07/07/2007    Scot Jun, PTA 01/15/2018, 10:12 AM  [place] [address] [place], [place], [address] Phone: [phone]   Fax:  [phone]  Name: [name] MRN: [id number] Date of Birth: 1945-12-06

## 2018-01-20 DIAGNOSIS — Z940 Kidney transplant status: Secondary | ICD-10-CM | POA: Diagnosis not present

## 2018-01-22 ENCOUNTER — Ambulatory Visit: Payer: Self-pay | Admitting: *Deleted

## 2018-01-22 NOTE — Telephone Encounter (Signed)
Pt states she takes a lot of meds, some from Dr Etter Sjogren and some from other drs. Pt thinks some of them may be causing her to be nauseous. Pt states she is having really bad nausea and would like a nurse to call her back. Pt is hoping she can get a Rx for her nausea, but has not had anything prescribed in the past.    Patient states she does take medications that were making her nauseous and she started eating with them and got better. Today she is having increased nausea and does not know why. Call to patient- in talking to patient- she mentioned she had some increased edema and her kidney doctor had doubled her fluid pills for 7 days. Discussed as she is losing that fluid -she needs to be aware of her electrolyte balance and she may want to get a Pedialyte to drink- she may also want to let them know about her nausea. She has not had vomiting. Encouraged bland diet as she is not eating much at all. Patient wants to know if she can take Pepto for nausea- told her she should make sure it is ok with her kidney doctor. She is going to contact them to check. Reason for Disposition . Taking prescription medication that could cause nausea (e.g., narcotics/opiates, antibiotics, OCPs, many others)  Answer Assessment - Initial Assessment Questions 1. NAUSEA SEVERITY: "How bad is the nausea?" (e.g., mild, moderate, severe; dehydration, weight loss)   - MILD: loss of appetite without change in eating habits   - MODERATE: decreased oral intake without significant weight loss, dehydration, or malnutrition   - SEVERE: inadequate caloric or fluid intake, significant weight loss, symptoms of dehydration     mild 2. ONSET: "When did the nausea begin?"     today 3. VOMITING: "Any vomiting?" If so, ask: "How many times today?"     No vomiting 4. RECURRENT SYMPTOM: "Have you had nausea before?" If so, ask: "When was the last time?" "What happened that time?"     Yes- same as before- but eating seemed to help that-  not helping now 5. CAUSE: "What do you think is causing the nausea?"     No contact with anyone sick 6. PREGNANCY: "Is there any chance you are pregnant?" (e.g., unprotected intercourse, missed birth control pill, broken condom)     n/a  Protocols used: NAUSEA-A-AH

## 2018-01-28 ENCOUNTER — Encounter: Payer: Self-pay | Admitting: Physical Therapy

## 2018-01-28 ENCOUNTER — Ambulatory Visit: Payer: Medicare Other | Admitting: Physical Therapy

## 2018-01-28 DIAGNOSIS — N182 Chronic kidney disease, stage 2 (mild): Secondary | ICD-10-CM | POA: Diagnosis not present

## 2018-01-28 DIAGNOSIS — M6281 Muscle weakness (generalized): Secondary | ICD-10-CM | POA: Diagnosis not present

## 2018-01-28 DIAGNOSIS — R262 Difficulty in walking, not elsewhere classified: Secondary | ICD-10-CM

## 2018-01-28 DIAGNOSIS — I129 Hypertensive chronic kidney disease with stage 1 through stage 4 chronic kidney disease, or unspecified chronic kidney disease: Secondary | ICD-10-CM | POA: Diagnosis not present

## 2018-01-28 DIAGNOSIS — Z940 Kidney transplant status: Secondary | ICD-10-CM | POA: Diagnosis not present

## 2018-01-28 NOTE — Therapy (Addendum)
[place], [place], [address] Phone: [phone]   Fax:  [phone]  Physical Therapy Treatment  Patient Details  Name: PAETON LATOUCHE MRN: [id number] Date of Birth: 03/27/1946 Referring Provider: Carollee Herter   Encounter Date: 01/28/2018  PT End of Session - 01/28/18 0916    Visit Number  6    Date for PT Re-Evaluation  03/03/18    PT Start Time  0840    PT Stop Time  0916    PT Time Calculation (min)  36 min    Activity Tolerance  Patient tolerated treatment well;Patient limited by fatigue    Behavior During Therapy  [facility] for tasks assessed/performed       Past Medical History:  Diagnosis Date  . Acute diastolic (congestive) heart failure ([place]) 08/12/2017  . AKI (acute kidney injury) ([place]) 11/04/2017  . Anemia   . Arthritis    HIP  . Asthma    as a child  . Atrial flutter [facility])    s/p aflutter ablation at [facility]  . Blood transfusion    never had a reaction to blood transfusion  . Cardiomyopathy Mar 2013   Mild, EF 50-55% by Mar 2013 ECHO, diast dysfxn II  . Closed fracture of right distal radius 10/18/2016  . CMV (cytomegalovirus infection) ([place]) 10/03/2015  . Constipation    takes Miralax daily  . Depression    takes Zoloft daily  . Distal radius fracture, right   . DVT of lower extremity, bilateral ([place]) 12/21/11   "they're there now; been there for 2 wks"  . Eczema   . ESRD (end stage renal disease) on dialysis [facility]) 09/2011   07-19-2015 had Kidney transplant at [facility]  . Fractures, stress    in both feet--6 OR 7 YRS AGO--RESOLVED  . Generalized edema [id number]  . Gout    doesn't require meds   . HCAP (healthcare-associated pneumonia)    09/10/11  . Hearing difficulty   . Hypercalcemia    09/10/11  . Hyperparathyroidism ([place]) 04/07/2013  . Hypertension    takes Diltiazem daily   . Hypomagnesemia 08/02/2015  . Hypophosphatemia 11/08/2017  . Immunosuppression ([place]) 07/25/2015   . Memory changes   . Morbid obesity ([place])   . Nonischemic dilated cardiomyopathy ([place])   . Obesity 04/07/2013  . Oligouria   . PAF (paroxysmal atrial fibrillation) ([facility])     HX OF CEREBRAL BLEED WHILE ON COUMADIN-SO PT NOT ON ANY BLOOD THINNERS NOW  . Peripheral vascular disease ([place])   . Presence of Watchman left atrial appendage closure device 10/04/2017  . Proteinuria 09/04/2016  . Psoriasis 08/07/2016  . 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 Sepsis ([place]) 11/08/2017  . Sleep apnea    uses CPAP nightly  . Stroke [facility]) 2009   denies residual, hemorrhagic now off coumadin  . Stroke due to intracerebral hemorrhage ([place]) 2009  . Suture reaction 09/04/2016    Past Surgical History:  Procedure Laterality Date  . AV FISTULA PLACEMENT  09/28/2011   Procedure: ARTERIOVENOUS (AV) FISTULA CREATION;  Surgeon: Rosetta Posner, MD;  Location: [place];  Service: Vascular;  Laterality: Left;  . CARDIAC VALVE SURGERY  1960    FOR ATRIAL SEPTAL DEFECT  . CHOLECYSTECTOMY     1980's  . COLONOSCOPY    . CYSTOSCOPY     many yrs ago  . FEMUR FRACTURE SURGERY  03/2011; 09/03/2011   right; "had 2, 2 wk apart in 2012; broke  it again 08/2011 & had OR"  . I&D EXTREMITY  09/15/2011   Procedure: IRRIGATION AND DEBRIDEMENT EXTREMITY w REMOVAL OF HARDWARE;  Surgeon: [name], MD;  Location: [place];  Service: Orthopedics;  Laterality: Right;  . INCISION AND DRAINAGE HIP  12/21/2011   Procedure: IRRIGATION AND DEBRIDEMENT HIP;  Surgeon: [name], MD;  Location: [place];  Service: Orthopedics;  Laterality: Right;  I&D RIGHT HIP WITH PLACEMENT ANTIBIOTIC SPACER  . INSERTION OF DIALYSIS CATHETER     Procedure: INSERTION OF DIALYSIS CATHETER;  Surgeon: Rosetta Posner, MD;  Location: [place];  Service: Vascular;  Laterality: Right;  . IRRIGATION AND DEBRIDEMENT ABSCESS  12/21/11   right hip  . JOINT REPLACEMENT    . KIDNEY TRANSPLANT  07/19/15  . LAPAROSCOPIC GASTRIC BANDING    . OPEN REDUCTION INTERNAL FIXATION  (ORIF) DISTAL RADIAL FRACTURE Right 10/18/2016   Procedure: OPEN REDUCTION INTERNAL FIXATION (ORIF) RIGHT DISTAL RADIAL FRACTURE;  Surgeon: Marchia Bond, MD;  Location: [place];  Service: Orthopedics;  Laterality: Right;  . TOTAL HIP ARTHROPLASTY  02/2011   right THA 02/2011, I&D/removal of hardware 09/2011,, repeat I&D Jun 2013, reimplantation R THA 03-26-2012  . TOTAL HIP REVISION  03/25/2012   Procedure: TOTAL HIP REVISION;  Surgeon: [name], MD;  [facility];  Service: Orthopedics;  Laterality: Right;  Right Total Hip Reimplantation  . VENA CAVA FILTER PLACEMENT  11/2011    There were no vitals filed for this visit.  Subjective Assessment - 01/28/18 0845    Subjective  Pt stated that her R knee buckled on her this morning when she was getting ready, almost falling.     Currently in Pain?  Yes    Pain Score  5     Pain Location  -- R arm, hip, and leg                       [facility] Adult PT Treatment/Exercise - 01/28/18 0001      Ambulation/Gait   Ambulation/Gait  Yes    Ambulation/Gait Assistance  4: Min guard;4: Min assist    Assistive device  1 person hand held assist    Gait Pattern  Step-through pattern;Decreased step length - left;Decreased hip/knee flexion - right;Shuffle    Ambulation Surface  Level;Indoor      High Level Balance   High Level Balance Activities  Side stepping      Knee/Hip Exercises: Aerobic   Nustep  level 3 x 7 minutes, 3 rest breaks needed, reports nausea during the first rest.       Knee/Hip Exercises: Seated   Long Arc Quad  Strengthening;Weights;10 reps;2 sets    [employer] Weight  2 lbs.    Ball Squeeze  2x10    Marching  3 sets;5 reps    Sit to [employer]  2 sets;5 reps;with UE support               PT Short Term Goals - 01/01/18 1147      PT SHORT TERM GOAL #1   Title  independent with a beginning HEP    Time  2    Period  Weeks    Status  New        PT Long Term Goals - 01/13/18 0998       PT LONG TERM GOAL #4   Title  be able to go up the back steps    Status  On-going      PT LONG TERM GOAL #5   Title  tolerate exercise without HR above 130    Status  On-going            Plan - 01/28/18 0916    Clinical Impression Statement  Patient requested a shorted PT session due to having a doctors appointment . Enter clinic reporting increase fatigue overall. Pt does require more assist with gait than usual. Encouragement needed to motivate pt to do activity    Rehab Potential  Fair    PT Frequency  2x / week    PT Duration  8 weeks    PT Treatment/Interventions  ADLs/Self Care Home Management;Gait training;Stair training;Functional mobility training;Therapeutic activities;Therapeutic exercise;Balance training;Neuromuscular re-education;Manual techniques;Patient/family education    PT Next Visit Plan  monitor HR with exercises       Patient will benefit from skilled therapeutic intervention in order to improve the following deficits and impairments:  Abnormal gait, Decreased range of motion, Difficulty walking, Cardiopulmonary status limiting activity, Decreased endurance, Decreased activity tolerance, Decreased balance, Decreased mobility, Decreased strength, Increased edema  Visit Diagnosis: Muscle weakness (generalized)  Difficulty in walking, not elsewhere classified     Problem List Patient Active Problem List   Diagnosis Date Noted  . Abscess of right leg 12/14/2017  . Lower extremity edema 12/10/2017  . Acute kidney injury ([place]) 11/17/2017  . Acute diastolic (congestive) heart failure ([place]) 11/17/2017  . UTI (urinary tract infection) 11/03/2017  . Diarrhea 11/03/2017  . Septic shock ([place]) 11/03/2017  . Chronic respiratory failure with hypoxia ([place]) 10/03/2017  . Dyspnea 07/26/2017  . Atrial flutter ([place])   . Peripheral vascular disease ([place])   . Oligouria   . Hypertension   . Fractures, stress   . Eczema   . Distal radius fracture, right   .  Depression   . Constipation   . Arthritis   . Exacerbation of asthma 04/01/2017  . Closed fracture of right distal radius 11/12/16  . Deceased-donor kidney transplant recipient 09/13/2015  . Nonischemic dilated cardiomyopathy ([place])   . Skin ulceration ([place]) 05/09/2013  . Abscess of shoulder 05/15/2012  . Expected blood loss anemia 03/26/2012  . S/P right TH revision 03/25/2012  . Abscess of hip, right 12/24/2011  . DVT of lower extremity, bilateral ([place]) 12/21/2011  . History of DVT (deep vein thrombosis) 12/08/2011  . OSA on CPAP 12/08/2011  . End stage renal disease ([place]) 11/06/2011  . 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[place]) 10/24/2011  . Pseudomonas aeruginosa infection 10/24/2011  . ESRD (end stage renal disease) on dialysis ([place]) 09/14/2011  . Hypercalcemia   . Community acquired pneumonia of left upper lobe of lung ([place]) 09/10/2011  . Gout 09/10/2011  . Absence of right hip joint with antibiotic pacer 09/03/2011  . Traumatic seroma of thigh ([place]) 06/11/2011  . Clostridium difficile colitis 06/09/2011  . Leukocytosis 06/08/2011  . Anemia 06/06/2011  . ARF (acute renal failure) ([place]) 06/06/2011  . SINUSITIS- ACUTE-NOS 09/27/2010  . DEPRESSION 11/25/2008  . PAF (paroxysmal atrial fibrillation) ([place]) 11/25/2008  . RENAL FAILURE, ACUTE 11/25/2008  . CVA 09/28/2008  . 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 WEAKNESS, LEFT SIDE OF BODY 09/21/2008  . Morbid obesity ([place]) 10/20/2007  . Stroke due to intracerebral hemorrhage ([place]) 07/17/2007  . Stroke ([place]) 07/17/2007  . Essential hypertension 07/07/2007  . Asthma 07/07/2007   PHYSICAL THERAPY DISCHARGE SUMMARY  Visits from Start of Care: 6 Plan: Patient agrees to discharge.  Patient goals were not  met. Patient is being discharged due to not returning since the last visit.  ?????     Scot Jun, PTA 01/28/2018, 9:21 AM  [place]  [address] [place], [place], [address] Phone: [phone]   Fax:  [phone]  Name: IRAIDA CRAGIN MRN: [id number] Date of Birth: 08-21-1945

## 2018-01-30 ENCOUNTER — Ambulatory Visit: Payer: Medicare Other | Admitting: Physical Therapy

## 2018-02-03 ENCOUNTER — Inpatient Hospital Stay (HOSPITAL_COMMUNITY): Payer: Medicare Other

## 2018-02-03 ENCOUNTER — Inpatient Hospital Stay (HOSPITAL_COMMUNITY)
Admission: EM | Admit: 2018-02-03 | Discharge: 2018-02-05 | DRG: 683 | Disposition: A | Discharge status: Other Acute Inpatient Hospital | Payer: Medicare Other | Attending: Internal Medicine | Admitting: Internal Medicine

## 2018-02-03 ENCOUNTER — Emergency Department (HOSPITAL_COMMUNITY): Payer: Medicare Other

## 2018-02-03 ENCOUNTER — Encounter: Payer: Medicare Other | Admitting: Physical Therapy

## 2018-02-03 ENCOUNTER — Encounter (HOSPITAL_COMMUNITY): Payer: Self-pay | Admitting: Emergency Medicine

## 2018-02-03 DIAGNOSIS — M109 Gout, unspecified: Secondary | ICD-10-CM | POA: Diagnosis present

## 2018-02-03 DIAGNOSIS — I480 Paroxysmal atrial fibrillation: Secondary | ICD-10-CM | POA: Diagnosis present

## 2018-02-03 DIAGNOSIS — D899 Disorder involving the immune mechanism, unspecified: Secondary | ICD-10-CM | POA: Diagnosis not present

## 2018-02-03 DIAGNOSIS — F329 Major depressive disorder, single episode, unspecified: Secondary | ICD-10-CM | POA: Diagnosis present

## 2018-02-03 DIAGNOSIS — Z8249 Family history of ischemic heart disease and other diseases of the circulatory system: Secondary | ICD-10-CM

## 2018-02-03 DIAGNOSIS — Z8673 Personal history of transient ischemic attack (TIA), and cerebral infarction without residual deficits: Secondary | ICD-10-CM

## 2018-02-03 DIAGNOSIS — E162 Hypoglycemia, unspecified: Secondary | ICD-10-CM | POA: Diagnosis present

## 2018-02-03 DIAGNOSIS — G4733 Obstructive sleep apnea (adult) (pediatric): Secondary | ICD-10-CM

## 2018-02-03 DIAGNOSIS — M160 Bilateral primary osteoarthritis of hip: Secondary | ICD-10-CM | POA: Diagnosis present

## 2018-02-03 DIAGNOSIS — N184 Chronic kidney disease, stage 4 (severe): Secondary | ICD-10-CM | POA: Diagnosis present

## 2018-02-03 DIAGNOSIS — J45909 Unspecified asthma, uncomplicated: Secondary | ICD-10-CM | POA: Diagnosis present

## 2018-02-03 DIAGNOSIS — I130 Hypertensive heart and chronic kidney disease with heart failure and stage 1 through stage 4 chronic kidney disease, or unspecified chronic kidney disease: Secondary | ICD-10-CM | POA: Diagnosis present

## 2018-02-03 DIAGNOSIS — Z7901 Long term (current) use of anticoagulants: Secondary | ICD-10-CM

## 2018-02-03 DIAGNOSIS — Z6836 Body mass index (BMI) 36.0-36.9, adult: Secondary | ICD-10-CM

## 2018-02-03 DIAGNOSIS — T8619 Other complication of kidney transplant: Secondary | ICD-10-CM | POA: Diagnosis present

## 2018-02-03 DIAGNOSIS — I4892 Unspecified atrial flutter: Secondary | ICD-10-CM | POA: Diagnosis present

## 2018-02-03 DIAGNOSIS — Z8679 Personal history of other diseases of the circulatory system: Secondary | ICD-10-CM

## 2018-02-03 DIAGNOSIS — F419 Anxiety disorder, unspecified: Secondary | ICD-10-CM | POA: Diagnosis present

## 2018-02-03 DIAGNOSIS — I10 Essential (primary) hypertension: Secondary | ICD-10-CM | POA: Diagnosis present

## 2018-02-03 DIAGNOSIS — R000 Tachycardia, unspecified: Secondary | ICD-10-CM | POA: Diagnosis not present

## 2018-02-03 DIAGNOSIS — M19011 Primary osteoarthritis, right shoulder: Secondary | ICD-10-CM | POA: Diagnosis present

## 2018-02-03 DIAGNOSIS — I5022 Chronic systolic (congestive) heart failure: Secondary | ICD-10-CM | POA: Diagnosis present

## 2018-02-03 DIAGNOSIS — J9611 Chronic respiratory failure with hypoxia: Secondary | ICD-10-CM | POA: Diagnosis present

## 2018-02-03 DIAGNOSIS — E875 Hyperkalemia: Secondary | ICD-10-CM

## 2018-02-03 DIAGNOSIS — I071 Rheumatic tricuspid insufficiency: Secondary | ICD-10-CM | POA: Diagnosis present

## 2018-02-03 DIAGNOSIS — E662 Morbid (severe) obesity with alveolar hypoventilation: Secondary | ICD-10-CM | POA: Diagnosis present

## 2018-02-03 DIAGNOSIS — I420 Dilated cardiomyopathy: Secondary | ICD-10-CM | POA: Diagnosis present

## 2018-02-03 DIAGNOSIS — M19012 Primary osteoarthritis, left shoulder: Secondary | ICD-10-CM | POA: Diagnosis present

## 2018-02-03 DIAGNOSIS — Z940 Kidney transplant status: Secondary | ICD-10-CM

## 2018-02-03 DIAGNOSIS — I82403 Acute embolism and thrombosis of unspecified deep veins of lower extremity, bilateral: Secondary | ICD-10-CM | POA: Diagnosis present

## 2018-02-03 DIAGNOSIS — R0602 Shortness of breath: Secondary | ICD-10-CM | POA: Diagnosis not present

## 2018-02-03 DIAGNOSIS — R079 Chest pain, unspecified: Secondary | ICD-10-CM | POA: Diagnosis not present

## 2018-02-03 DIAGNOSIS — I472 Ventricular tachycardia: Secondary | ICD-10-CM | POA: Diagnosis not present

## 2018-02-03 DIAGNOSIS — R531 Weakness: Secondary | ICD-10-CM | POA: Diagnosis not present

## 2018-02-03 DIAGNOSIS — N189 Chronic kidney disease, unspecified: Secondary | ICD-10-CM

## 2018-02-03 DIAGNOSIS — K5900 Constipation, unspecified: Secondary | ICD-10-CM | POA: Diagnosis present

## 2018-02-03 DIAGNOSIS — N179 Acute kidney failure, unspecified: Principal | ICD-10-CM | POA: Diagnosis present

## 2018-02-03 DIAGNOSIS — I499 Cardiac arrhythmia, unspecified: Secondary | ICD-10-CM | POA: Diagnosis not present

## 2018-02-03 DIAGNOSIS — I619 Nontraumatic intracerebral hemorrhage, unspecified: Secondary | ICD-10-CM | POA: Diagnosis present

## 2018-02-03 DIAGNOSIS — Z86718 Personal history of other venous thrombosis and embolism: Secondary | ICD-10-CM

## 2018-02-03 DIAGNOSIS — I4891 Unspecified atrial fibrillation: Secondary | ICD-10-CM | POA: Diagnosis present

## 2018-02-03 DIAGNOSIS — Z79899 Other long term (current) drug therapy: Secondary | ICD-10-CM

## 2018-02-03 DIAGNOSIS — D649 Anemia, unspecified: Secondary | ICD-10-CM | POA: Diagnosis present

## 2018-02-03 DIAGNOSIS — Y830 Surgical operation with transplant of whole organ as the cause of abnormal reaction of the patient, or of later complication, without mention of misadventure at the time of the procedure: Secondary | ICD-10-CM | POA: Diagnosis present

## 2018-02-03 DIAGNOSIS — Z7952 Long term (current) use of systemic steroids: Secondary | ICD-10-CM

## 2018-02-03 DIAGNOSIS — R339 Retention of urine, unspecified: Secondary | ICD-10-CM | POA: Diagnosis present

## 2018-02-03 DIAGNOSIS — R9431 Abnormal electrocardiogram [ECG] [EKG]: Secondary | ICD-10-CM | POA: Diagnosis not present

## 2018-02-03 DIAGNOSIS — E161 Other hypoglycemia: Secondary | ICD-10-CM | POA: Diagnosis not present

## 2018-02-03 DIAGNOSIS — Z96641 Presence of right artificial hip joint: Secondary | ICD-10-CM | POA: Diagnosis present

## 2018-02-03 DIAGNOSIS — I739 Peripheral vascular disease, unspecified: Secondary | ICD-10-CM | POA: Diagnosis present

## 2018-02-03 DIAGNOSIS — Z888 Allergy status to other drugs, medicaments and biological substances status: Secondary | ICD-10-CM

## 2018-02-03 DIAGNOSIS — Z7951 Long term (current) use of inhaled steroids: Secondary | ICD-10-CM | POA: Diagnosis not present

## 2018-02-03 DIAGNOSIS — I82409 Acute embolism and thrombosis of unspecified deep veins of unspecified lower extremity: Secondary | ICD-10-CM | POA: Diagnosis present

## 2018-02-03 DIAGNOSIS — Z7982 Long term (current) use of aspirin: Secondary | ICD-10-CM

## 2018-02-03 DIAGNOSIS — Z8261 Family history of arthritis: Secondary | ICD-10-CM

## 2018-02-03 DIAGNOSIS — E7800 Pure hypercholesterolemia, unspecified: Secondary | ICD-10-CM | POA: Diagnosis present

## 2018-02-03 DIAGNOSIS — D751 Secondary polycythemia: Secondary | ICD-10-CM | POA: Diagnosis present

## 2018-02-03 DIAGNOSIS — E860 Dehydration: Secondary | ICD-10-CM | POA: Diagnosis present

## 2018-02-03 DIAGNOSIS — E213 Hyperparathyroidism, unspecified: Secondary | ICD-10-CM | POA: Diagnosis present

## 2018-02-03 DIAGNOSIS — Z9884 Bariatric surgery status: Secondary | ICD-10-CM

## 2018-02-03 DIAGNOSIS — Z95828 Presence of other vascular implants and grafts: Secondary | ICD-10-CM

## 2018-02-03 DIAGNOSIS — I5033 Acute on chronic diastolic (congestive) heart failure: Secondary | ICD-10-CM | POA: Diagnosis not present

## 2018-02-03 DIAGNOSIS — I213 ST elevation (STEMI) myocardial infarction of unspecified site: Secondary | ICD-10-CM | POA: Diagnosis not present

## 2018-02-03 DIAGNOSIS — Z9989 Dependence on other enabling machines and devices: Secondary | ICD-10-CM

## 2018-02-03 DIAGNOSIS — Z95818 Presence of other cardiac implants and grafts: Secondary | ICD-10-CM

## 2018-02-03 DIAGNOSIS — I361 Nonrheumatic tricuspid (valve) insufficiency: Secondary | ICD-10-CM | POA: Diagnosis not present

## 2018-02-03 HISTORY — DX: Obstructive sleep apnea (adult) (pediatric): G47.33

## 2018-02-03 HISTORY — DX: Anxiety disorder, unspecified: F41.9

## 2018-02-03 HISTORY — DX: Pure hypercholesterolemia, unspecified: E78.00

## 2018-02-03 HISTORY — DX: Unspecified asthma, uncomplicated: J45.909

## 2018-02-03 HISTORY — DX: Obstructive sleep apnea (adult) (pediatric): Z99.89

## 2018-02-03 LAB — CBG MONITORING, ED
Glucose-Capillary: 15 mg/dL — CL (ref 70–99)
Glucose-Capillary: 82 mg/dL (ref 70–99)
Glucose-Capillary: 154 mg/dL — ABNORMAL HIGH (ref 70–99)

## 2018-02-03 LAB — COMPREHENSIVE METABOLIC PANEL
ALT: 57 U/L — ABNORMAL HIGH (ref 0–44)
AST: 76 U/L — ABNORMAL HIGH (ref 15–41)
Albumin: 4.1 g/dL (ref 3.5–5.0)
Alkaline Phosphatase: 74 U/L (ref 38–126)
Anion gap: 25 — ABNORMAL HIGH (ref 5–15)
BUN: 39 mg/dL — ABNORMAL HIGH (ref 8–23)
CO2: 18 mmol/L — ABNORMAL LOW (ref 22–32)
Calcium: 10.8 mg/dL — ABNORMAL HIGH (ref 8.9–10.3)
Chloride: 92 mmol/L — ABNORMAL LOW (ref 98–111)
Creatinine, Ser: 5 mg/dL — ABNORMAL HIGH (ref 0.44–1.00)
GFR calc Af Amer: 9 mL/min — ABNORMAL LOW (ref >60)
GFR calc non Af Amer: 8 mL/min — ABNORMAL LOW (ref >60)
Glucose, Bld: 37 mg/dL — CL (ref 70–99)
Potassium: 5.6 mmol/L — ABNORMAL HIGH (ref 3.5–5.1)
Sodium: 135 mmol/L (ref 135–145)
Total Bilirubin: 3.4 mg/dL — ABNORMAL HIGH (ref 0.3–1.2)
Total Protein: 7.1 g/dL (ref 6.5–8.1)

## 2018-02-03 LAB — I-STAT TROPONIN, ED: Troponin i, poc: 0.36 ng/mL (ref 0.00–0.08)

## 2018-02-03 LAB — I-STAT CHEM 8, ED
BUN: 64 mg/dL — ABNORMAL HIGH (ref 8–23)
BUN: 42 mg/dL — ABNORMAL HIGH (ref 8–23)
Calcium, Ion: 0.95 mmol/L — ABNORMAL LOW (ref 1.15–1.40)
Calcium, Ion: 1.05 mmol/L — ABNORMAL LOW (ref 1.15–1.40)
Chloride: 99 mmol/L (ref 98–111)
Chloride: 97 mmol/L — ABNORMAL LOW (ref 98–111)
Creatinine, Ser: 5 mg/dL — ABNORMAL HIGH (ref 0.44–1.00)
Creatinine, Ser: 5 mg/dL — ABNORMAL HIGH (ref 0.44–1.00)
Glucose, Bld: 36 mg/dL — CL (ref 70–99)
Glucose, Bld: 37 mg/dL — CL (ref 70–99)
HCT: 55 % — ABNORMAL HIGH (ref 36.0–46.0)
HCT: 54 % — ABNORMAL HIGH (ref 36.0–46.0)
Hemoglobin: 18.7 g/dL — ABNORMAL HIGH (ref 12.0–15.0)
Hemoglobin: 18.4 g/dL — ABNORMAL HIGH (ref 12.0–15.0)
Potassium: 8.3 mmol/L (ref 3.5–5.1)
Potassium: 5.4 mmol/L — ABNORMAL HIGH (ref 3.5–5.1)
Sodium: 129 mmol/L — ABNORMAL LOW (ref 135–145)
Sodium: 132 mmol/L — ABNORMAL LOW (ref 135–145)
TCO2: 21 mmol/L — ABNORMAL LOW (ref 22–32)
TCO2: 22 mmol/L (ref 22–32)

## 2018-02-03 LAB — CBC
HCT: 47.9 % — ABNORMAL HIGH (ref 36.0–46.0)
Hemoglobin: 14.4 g/dL (ref 12.0–15.0)
MCH: 26.2 pg (ref 26.0–34.0)
MCHC: 30.1 g/dL (ref 30.0–36.0)
MCV: 87.1 fL (ref 78.0–100.0)
Platelets: 258 10*3/uL (ref 150–400)
RBC: 5.5 MIL/uL — ABNORMAL HIGH (ref 3.87–5.11)
RDW: 21.4 % — ABNORMAL HIGH (ref 11.5–15.5)
WBC: 13 10*3/uL — ABNORMAL HIGH (ref 4.0–10.5)

## 2018-02-03 LAB — I-STAT CG4 LACTIC ACID, ED: Lactic Acid, Venous: 10.22 mmol/L (ref 0.5–1.9)

## 2018-02-03 LAB — MAGNESIUM: Magnesium: 2.1 mg/dL (ref 1.7–2.4)

## 2018-02-03 IMAGING — DX DG CHEST 1V PORT
1 series · 1 of 1 positions shown · non-contrast
Comparison: Two-view chest x-ray [DATE].

CLINICAL DATA: Chest pain. Shortness of breath. Generalized
weakness.

EXAM:
PORTABLE CHEST 1 VIEW

[chest]
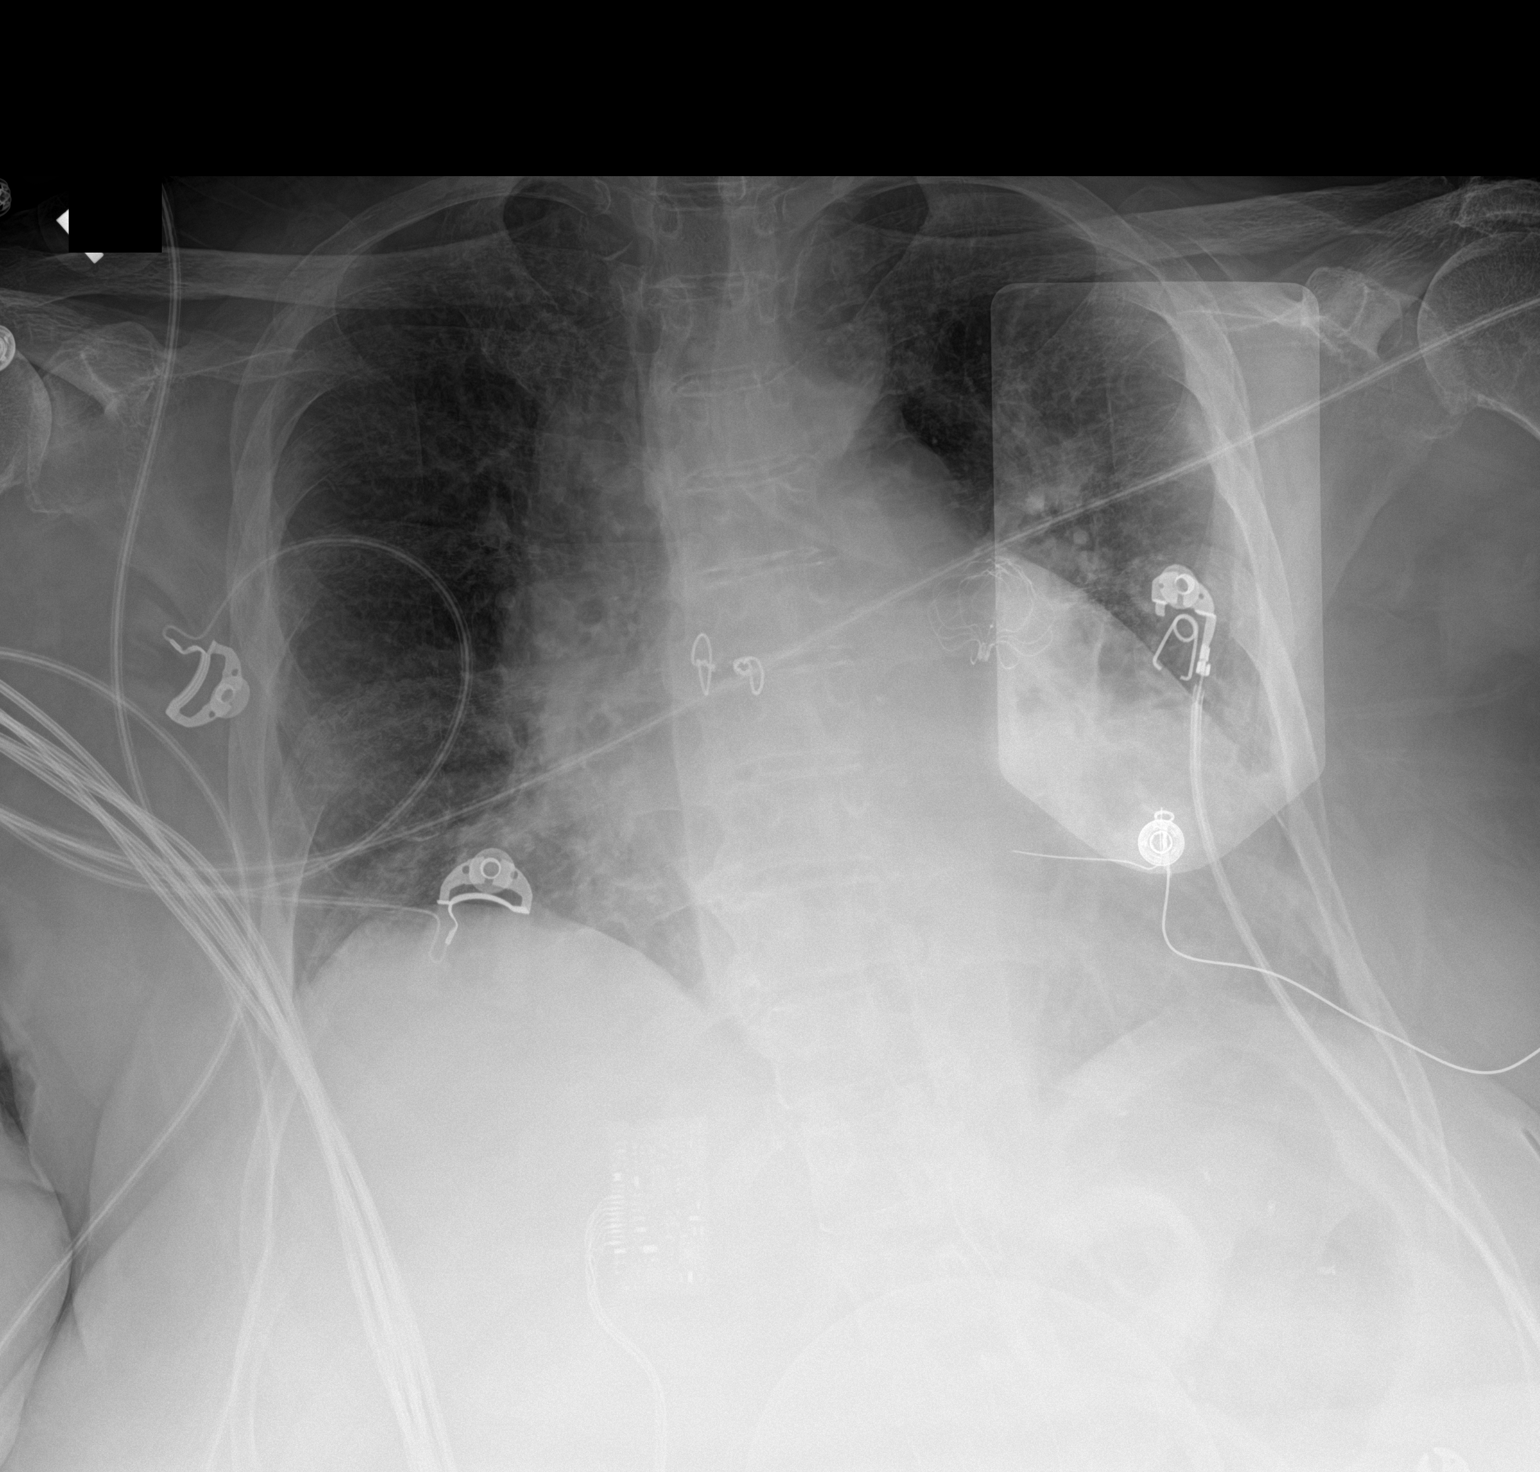

[1 of 1 positions shown; findings below may reference images not displayed]

FINDINGS: Heart is enlarged. Defibrillator pad is in place. Lung volumes are
low. Mild pulmonary vascular congestion is present. No focal
airspace disease is evident.
IMPRESSION: 1. Cardiomegaly and mild pulmonary vascular congestion without frank
edema. Early congestive heart failure is not excluded.
2. No focal airspace disease.

## 2018-02-03 MED ORDER — SODIUM CHLORIDE 0.9 % IV SOLN
2.0000 g | Freq: Once | INTRAVENOUS | Status: AC
  Administered 2018-02-03: 2 g via INTRAVENOUS
  Filled 2018-02-03: qty 20

## 2018-02-03 MED ORDER — ETOMIDATE 2 MG/ML IV SOLN
INTRAVENOUS | Status: AC | PRN
  Administered 2018-02-03: 5 mg via INTRAVENOUS

## 2018-02-03 MED ORDER — VANCOMYCIN HCL 10 G IV SOLR
1500.0000 mg | Freq: Once | INTRAVENOUS | Status: DC
  Filled 2018-02-03: qty 1500

## 2018-02-03 MED ORDER — ASPIRIN EC 81 MG PO TBEC: 81.0000 mg | DELAYED_RELEASE_TABLET | Freq: Every day | ORAL | Status: DC

## 2018-02-03 MED ORDER — MAGNESIUM SULFATE 2 GM/50ML IV SOLN: 2.0000 g | Freq: Once | INTRAVENOUS | Status: DC

## 2018-02-03 MED ORDER — INSULIN ASPART 100 UNIT/ML ~~LOC~~ SOLN: 5.0000 [IU] | Freq: Once | SUBCUTANEOUS | Status: DC

## 2018-02-03 MED ORDER — HEPARIN SODIUM (PORCINE) 5000 UNIT/ML IJ SOLN: 5000.0000 [IU] | Freq: Three times a day (TID) | INTRAMUSCULAR | Status: DC

## 2018-02-03 MED ORDER — PREDNISONE 5 MG PO TABS
5.0000 mg | ORAL_TABLET | Freq: Every day | ORAL | Status: DC
  Administered 2018-02-04 – 2018-02-05 (×2): 5 mg via ORAL
  Filled 2018-02-03 (×2): qty 1

## 2018-02-03 MED ORDER — TACROLIMUS 1 MG PO TB24
2.0000 | ORAL_TABLET | Freq: Every day | ORAL | Status: DC
  Filled 2018-02-03: qty 100

## 2018-02-03 MED ORDER — ALBUTEROL (5 MG/ML) CONTINUOUS INHALATION SOLN
10.0000 mg/h | INHALATION_SOLUTION | Freq: Once | RESPIRATORY_TRACT | Status: AC
  Administered 2018-02-03: 10 mg/h via RESPIRATORY_TRACT
  Filled 2018-02-03: qty 20

## 2018-02-03 MED ORDER — PROPAFENONE HCL 150 MG PO TABS
150.0000 mg | ORAL_TABLET | Freq: Three times a day (TID) | ORAL | Status: DC
  Administered 2018-02-04 – 2018-02-05 (×4): 150 mg via ORAL
  Filled 2018-02-03 (×7): qty 1

## 2018-02-03 MED ORDER — ADENOSINE 6 MG/2ML IV SOLN
INTRAVENOUS | Status: AC | PRN
  Administered 2018-02-03: 6 mg via INTRAVENOUS

## 2018-02-03 MED ORDER — FERROUS SULFATE 325 (65 FE) MG PO TABS: 325.0000 mg | ORAL_TABLET | Freq: Every day | ORAL | Status: DC

## 2018-02-03 MED ORDER — DEXTROSE 50 % IV SOLN
INTRAVENOUS | Status: AC
  Filled 2018-02-03: qty 50

## 2018-02-03 MED ORDER — HEPARIN (PORCINE) IN NACL 100-0.45 UNIT/ML-% IJ SOLN
1100.0000 [IU]/h | INTRAMUSCULAR | Status: DC
  Administered 2018-02-03: 1100 [IU]/h via INTRAVENOUS
  Filled 2018-02-03: qty 250

## 2018-02-03 MED ORDER — ACETAMINOPHEN 650 MG RE SUPP: 650.0000 mg | Freq: Four times a day (QID) | RECTAL | Status: DC | PRN

## 2018-02-03 MED ORDER — ALBUTEROL SULFATE (2.5 MG/3ML) 0.083% IN NEBU: 2.5000 mg | INHALATION_SOLUTION | Freq: Four times a day (QID) | RESPIRATORY_TRACT | Status: DC | PRN

## 2018-02-03 MED ORDER — SENNOSIDES-DOCUSATE SODIUM 8.6-50 MG PO TABS: 1.0000 | ORAL_TABLET | Freq: Every evening | ORAL | Status: DC | PRN

## 2018-02-03 MED ORDER — ADENOSINE 6 MG/2ML IV SOLN
INTRAVENOUS | Status: AC
Start: 2018-02-03 — End: 2018-02-04
  Filled 2018-02-03: qty 4

## 2018-02-03 MED ORDER — SODIUM BICARBONATE 650 MG PO TABS
650.0000 mg | ORAL_TABLET | Freq: Two times a day (BID) | ORAL | Status: DC
  Administered 2018-02-03 – 2018-02-05 (×4): 650 mg via ORAL
  Filled 2018-02-03 (×4): qty 1

## 2018-02-03 MED ORDER — PIPERACILLIN-TAZOBACTAM 3.375 G IVPB 30 MIN: 3.3750 g | Freq: Once | INTRAVENOUS | Status: DC

## 2018-02-03 MED ORDER — SODIUM CHLORIDE 0.9 % IV BOLUS
500.0000 mL | Freq: Once | INTRAVENOUS | Status: AC
  Administered 2018-02-03: 500 mL via INTRAVENOUS

## 2018-02-03 MED ORDER — ALBUTEROL (5 MG/ML) CONTINUOUS INHALATION SOLN: 10.0000 mg/h | INHALATION_SOLUTION | Freq: Once | RESPIRATORY_TRACT | Status: DC

## 2018-02-03 MED ORDER — ONDANSETRON HCL 4 MG/2ML IJ SOLN
4.0000 mg | Freq: Four times a day (QID) | INTRAMUSCULAR | Status: DC | PRN
  Administered 2018-02-03 – 2018-02-04 (×2): 4 mg via INTRAVENOUS
  Filled 2018-02-03 (×2): qty 2

## 2018-02-03 MED ORDER — ACETAMINOPHEN 325 MG PO TABS: 650.0000 mg | ORAL_TABLET | Freq: Four times a day (QID) | ORAL | Status: DC | PRN

## 2018-02-03 MED ORDER — SERTRALINE HCL 50 MG PO TABS
50.0000 mg | ORAL_TABLET | Freq: Every day | ORAL | Status: DC
  Administered 2018-02-04 – 2018-02-05 (×2): 50 mg via ORAL
  Filled 2018-02-03 (×2): qty 1

## 2018-02-03 MED ORDER — ONDANSETRON HCL 4 MG PO TABS
4.0000 mg | ORAL_TABLET | Freq: Four times a day (QID) | ORAL | Status: DC | PRN
  Administered 2018-02-04 – 2018-02-05 (×2): 4 mg via ORAL
  Filled 2018-02-03 (×2): qty 1

## 2018-02-03 MED ORDER — SODIUM CHLORIDE 0.9 % IV BOLUS
1000.0000 mL | Freq: Once | INTRAVENOUS | Status: AC
  Administered 2018-02-03: 1000 mL via INTRAVENOUS

## 2018-02-03 MED ORDER — SODIUM BICARBONATE 8.4 % IV SOLN
INTRAVENOUS | Status: AC
  Administered 2018-02-03: 50 meq
  Filled 2018-02-03: qty 50

## 2018-02-03 MED ORDER — SODIUM CHLORIDE 0.9 % IV SOLN
INTRAVENOUS | Status: AC
Start: 2018-02-03 — End: 2018-02-04
  Administered 2018-02-03: 22:00:00 via INTRAVENOUS

## 2018-02-03 MED ORDER — HYDROCODONE-ACETAMINOPHEN 5-325 MG PO TABS: 1.0000 | ORAL_TABLET | ORAL | Status: DC | PRN

## 2018-02-03 MED ORDER — MOMETASONE FURO-FORMOTEROL FUM 200-5 MCG/ACT IN AERO
2.0000 | INHALATION_SPRAY | Freq: Two times a day (BID) | RESPIRATORY_TRACT | Status: DC
  Administered 2018-02-04 – 2018-02-05 (×3): 2 via RESPIRATORY_TRACT
  Filled 2018-02-03: qty 8.8

## 2018-02-03 MED ORDER — ETOMIDATE 2 MG/ML IV SOLN
INTRAVENOUS | Status: AC | PRN
  Administered 2018-02-03: 10 mg via INTRAVENOUS

## 2018-02-03 MED ORDER — DEXTROSE 50 % IV SOLN
50.0000 mL | Freq: Once | INTRAVENOUS | Status: AC
  Administered 2018-02-03: 50 mL via INTRAVENOUS

## 2018-02-03 MED ORDER — SODIUM BICARBONATE 8.4 % IV SOLN
50.0000 meq | Freq: Once | INTRAVENOUS | Status: AC
  Administered 2018-02-03: 50 meq via INTRAVENOUS
  Filled 2018-02-03: qty 50

## 2018-02-03 MED ORDER — BISACODYL 10 MG RE SUPP: 10.0000 mg | Freq: Every day | RECTAL | Status: DC | PRN

## 2018-02-03 MED ORDER — SODIUM CHLORIDE 0.9 % IV SOLN
1.0000 g | Freq: Once | INTRAVENOUS | Status: DC
  Filled 2018-02-03: qty 10

## 2018-02-03 MED ORDER — ETOMIDATE 2 MG/ML IV SOLN
INTRAVENOUS | Status: AC
  Filled 2018-02-03: qty 10

## 2018-02-03 NOTE — Consult Note (Addendum)
ELECTROPHYSIOLOGY CONSULT NOTE  Patient ID: [name], MRN: [id number], DOB/AGE: Mar 28, 1946 72 y.o. Admit date: 02/03/2018 Date of Consult: 02/03/2018  Primary Physician: [name], DO Primary Cardiologist: [facility] [name] is a 72 y.o. female who is being seen today for the evaluation of WCT at the request of Dr .Ellender Hose   Chief Complaint: WCT    HPI [name] is a 73 y.o. female with hx of renal transplant presented with diarrhea and weakness and found to be in a reasonably tolerated WCT at HR 150 or so  Cr is acutely 5, had acute injury 4/19,  1.0 >>3.1> 1.7  Has hx of Atrial flutter ablation 1/17 and recurrent atrial fib/flutter with various AAD tried, sotalol, amio and most recently propafenone  Complicated Hx incl DVT and IVC filter, ICH (2010 while on warfarin) OSA,    TEE 5/19 EF normal, Watchman 3/19  in place, on Rivaroxaban and ASA per [facility] until September   Hx of Gastric bypass   Past Medical History:  Diagnosis Date  . Acute diastolic (congestive) heart failure ([place]) 08/12/2017  . AKI (acute kidney injury) ([place]) 11/04/2017  . Anemia   . Arthritis    HIP  . Asthma    as a child  . Atrial flutter [facility])    s/p aflutter ablation at [facility]  . Blood transfusion    never had a reaction to blood transfusion  . Cardiomyopathy Mar 2013   Mild, EF 50-55% by Mar 2013 ECHO, diast dysfxn II  . Closed fracture of right distal radius 10/18/2016  . CMV (cytomegalovirus infection) ([place]) 10/03/2015  . Constipation    takes Miralax daily  . Depression    takes Zoloft daily  . Distal radius fracture, right   . DVT of lower extremity, bilateral ([place]) 12/21/11   "they're there now; been there for 2 wks"  . Eczema   . ESRD (end stage renal disease) on dialysis [facility]) 09/2011   07-19-2015 had Kidney transplant [facility]  . Fractures, stress    in both feet--6 OR 7 YRS AGO--RESOLVED  . Generalized edema [id number]  . Gout    doesn't require meds   .  HCAP (healthcare-associated pneumonia)    09/10/11  . Hearing difficulty   . Hypercalcemia    09/10/11  . Hyperparathyroidism ([place]) 04/07/2013  . Hypertension    takes Diltiazem daily   . Hypomagnesemia 08/02/2015  . Hypophosphatemia 11/08/2017  . Immunosuppression ([place]) 07/25/2015  . Memory changes   . Morbid obesity ([place])   . Nonischemic dilated cardiomyopathy ([place])   . Obesity 04/07/2013  . Oligouria   . PAF (paroxysmal atrial fibrillation) ([facility])     HX OF CEREBRAL BLEED WHILE ON COUMADIN-SO PT NOT ON ANY BLOOD THINNERS NOW  . Peripheral vascular disease ([place])   . Presence of Watchman left atrial appendage closure device 10/04/2017  . Proteinuria 09/04/2016  . Psoriasis 08/07/2016  . 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 Sepsis ([place]) 11/08/2017  . Sleep apnea    uses CPAP nightly  . Stroke [facility]) 2009   denies residual, hemorrhagic now off coumadin  . Stroke due to intracerebral hemorrhage ([place]) 2009  . Suture reaction 09/04/2016      Surgical History:  Past Surgical History:  Procedure Laterality Date  . AV FISTULA PLACEMENT  09/28/2011   Procedure: ARTERIOVENOUS (AV) FISTULA CREATION;  Surgeon: Rosetta Posner, MD;  Location: [place];  Service: Vascular;  Laterality: Left;  . CARDIAC  VALVE SURGERY  1960    FOR ATRIAL SEPTAL DEFECT  . CHOLECYSTECTOMY     1980's  . COLONOSCOPY    . CYSTOSCOPY     many yrs ago  . FEMUR FRACTURE SURGERY  03/2011; 09/03/2011   right; "had 2, 2 wk apart in 2012; broke it again 08/2011 & had OR"  . I&D EXTREMITY  09/15/2011   Procedure: IRRIGATION AND DEBRIDEMENT EXTREMITY w REMOVAL OF HARDWARE;  Surgeon: [name], MD;  Location: [place];  Service: Orthopedics;  Laterality: Right;  . INCISION AND DRAINAGE HIP  12/21/2011   Procedure: IRRIGATION AND DEBRIDEMENT HIP;  Surgeon: [name], MD;  Location: [place];  Service: Orthopedics;  Laterality: Right;  I&D RIGHT HIP WITH PLACEMENT ANTIBIOTIC SPACER  . INSERTION OF DIALYSIS CATHETER      Procedure: INSERTION OF DIALYSIS CATHETER;  Surgeon: Rosetta Posner, MD;  Location: [place];  Service: Vascular;  Laterality: Right;  . IRRIGATION AND DEBRIDEMENT ABSCESS  12/21/11   right hip  . JOINT REPLACEMENT    . KIDNEY TRANSPLANT  07/19/15  . LAPAROSCOPIC GASTRIC BANDING    . OPEN REDUCTION INTERNAL FIXATION (ORIF) DISTAL RADIAL FRACTURE Right 10/18/2016   Procedure: OPEN REDUCTION INTERNAL FIXATION (ORIF) RIGHT DISTAL RADIAL FRACTURE;  Surgeon: Marchia Bond, MD;  Location: [place];  Service: Orthopedics;  Laterality: Right;  . TOTAL HIP ARTHROPLASTY  02/2011   right THA 02/2011, I&D/removal of hardware 09/2011,, repeat I&D Jun 2013, reimplantation R THA 03-26-2012  . TOTAL HIP REVISION  03/25/2012   Procedure: TOTAL HIP REVISION;  Surgeon: [name], MD;  Location: [facility];  Service: Orthopedics;  Laterality: Right;  Right Total Hip Reimplantation  . VENA CAVA FILTER PLACEMENT  11/2011     Home Meds: Prior to Admission medications   Medication Sig Start Date End Date Taking? Authorizing Provider  acetaminophen (TYLENOL) 500 MG tablet Take 500 mg by mouth every 6 (six) hours as needed.    [provider]  albuterol (PROVENTIL HFA;VENTOLIN HFA) 108 (90 Base) MCG/ACT inhaler Inhale 2 puffs into the lungs every 6 (six) hours as needed for wheezing or shortness of breath. 06/21/17   [name], DO  aspirin EC 81 MG tablet Take 81 mg by mouth. 07/21/15   [provider]  budesonide-formoterol (SYMBICORT) 160-4.5 MCG/ACT inhaler Inhale 2 puffs into the lungs 2 (two) times daily. 10/02/17   Parrett, Fonnie Mu, NP  carvedilol (COREG) 25 MG tablet Take 25 mg by mouth 2 (two) times daily with a meal.    [provider]  docusate sodium (COLACE) 100 MG capsule Take 100 mg by mouth daily as needed (For constipation.).     [provider]  ferrous sulfate 325 (65 FE) MG tablet Take 325 mg by mouth daily with breakfast.    [provider]    furosemide (LASIX) 40 MG tablet Take 80 mg by mouth daily.    [provider]  furosemide (LASIX) 80 MG tablet Take 80 mg by mouth 2 (two) times daily.    [provider]  mycophenolate (MYFORTIC) 180 MG EC tablet Take 180 mg by mouth 2 (two) times daily.    [provider]  NONFORMULARY OR COMPOUNDED ITEM Compression stockings --thigh high  20-30 mm hg Dx  Low ext edema 12/10/17   Carollee Herter, Alferd Apa, DO  potassium chloride SA (K-DUR,KLOR-CON) 20 MEQ tablet Take 20 mEq by mouth 2 (two) times daily.  [provider]  predniSONE (DELTASONE) 5 MG tablet Take 5 mg by mouth daily with breakfast.    [provider]  propafenone (RYTHMOL) 150 MG tablet Take 150 mg by mouth every 8 (eight) hours.    [provider]  rivaroxaban (XARELTO) 20 MG TABS tablet Take 1 tablet (20 mg total) by mouth daily. 11/04/17   Omar Person, NP  sertraline (ZOLOFT) 50 MG tablet Take 1 tablet (50 mg total) by mouth daily. 04/19/17   [name], DO  Tacrolimus (ENVARSUS XR) 1 MG TB24 Take 1 tablet by mouth daily.    [provider]    Inpatient Medications:  . adenosine      . etomidate        Allergies:  Allergies  Allergen Reactions  . Ace Inhibitors Other (See Comments)    Reaction unknown  . 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 Warfarin Sodium Other (See Comments)    Caused her to have a stroke    Social History   Socioeconomic History  . Marital status: Married    Spouse name: Not on file  . Number of children: Not on file  . Years of education: Not on file  . Highest education level: Not on file  Occupational History  . Occupation: retired  Scientific laboratory technician  . Financial resource strain: Not on file  . Food insecurity:    Worry: Not on file    Inability: Not on file  . Transportation needs:    Medical: Not on file    Non-medical: Not on file   Tobacco Use  . Smoking status: Never Smoker  . Smokeless tobacco: Never Used  Substance and Sexual Activity  . Alcohol use: No  . Drug use: No  . Sexual activity: Not Currently    Birth control/protection: Post-menopausal  Lifestyle  . Physical activity:    Days per week: Not on file    Minutes per session: Not on file  . Stress: Not on file  Relationships  . Social connections:    Talks on phone: Not on file    Gets together: Not on file    Attends religious service: Not on file    Active member of club or organization: Not on file    Attends meetings of clubs or organizations: Not on file    Relationship status: Not on file  . Intimate partner violence:    Fear of current or ex partner: Not on file    Emotionally abused: Not on file    Physically abused: Not on file    Forced sexual activity: Not on file  Other Topics Concern  . Not on file  Social History Narrative   Married; retired; lives in [place]     Family History  Problem Relation Age of Onset  . Cancer Father   . Diabetes Mother   . Hypertension Mother   . Arthritis Mother   . Hodgkin's lymphoma Unknown 32       dscd---HODGKINS DISEASE  . Hypertension Brother      ROS:  Please see the history of present illness.     All other systems reviewed and negative.    Physical Exam: Blood pressure 138/66, pulse 69, temperature (!) 97.2 F (36.2 C), temperature source Temporal, resp. rate (!) 24, SpO2 100 %. General: Well developed, well nourished female in no acute distress. Head: Normocephalic, atraumatic, sclera non-icteric, no  xanthomas, nares are without discharge. EENT: normal Lymph Nodes:  none Back: without scoliosis/kyphosis , no CVA tendersness Neck: Negative for carotid bruits. JVDnot Lungs: Clear bilaterally to auscultation without wheezes, rales, or rhonchi. Breathing is unlabored. Heart: RRR with S1 S2.  2/6 systolic  murmur , rubs, or gallops appreciated. Abdomen: Soft, non-tender,  non-distended with normoactive bowel sounds. No hepatomegaly. No rebound/guarding. No obvious abdominal masses. Msk:  Strength and tone appear normal for age. Extremities: No clubbing or cyanosis. No  edema.  Distal pedal pulses are 2+ and equal bilaterally. Skin: Warm and Dry Neuro: Alert and oriented X 3. CN III-XII intact Grossly normal sensory and motor function . Psych:  Responds to questions appropriately with a normal affect.      Labs: Cardiac Enzymes No results for input(s): CKTOTAL, CKMB, TROPONINI in the last 72 hours. CBC Lab Results  Component Value Date   WBC 13.0 (H) 02/03/2018   HGB 18.4 (H) 02/03/2018   HCT 54.0 (H) 02/03/2018   MCV 87.1 02/03/2018   PLT 258 02/03/2018   PROTIME: No results for input(s): LABPROT, INR in the last 72 hours. Chemistry  Recent Labs  Lab 02/03/18 1447 02/03/18 1458  NA 135 132*  K 5.6* 5.4*  CL 92* 97*  CO2 18*  --   BUN 39* 42*  CREATININE 5.00* 5.00*  CALCIUM 10.8*  --   PROT 7.1  --   BILITOT 3.4*  --   ALKPHOS 74  --   ALT 57*  --   AST 76*  --   GLUCOSE 37* 37*   Lipids Lab Results  Component Value Date   CHOL  11/16/2008    184        ATP III CLASSIFICATION:  <200     mg/dL   Desirable  200-239  mg/dL   Borderline High  >=240    mg/dL   High          HDL 33 (L) 11/16/2008   LDLCALC (H) 11/16/2008    130        Total Cholesterol/HDL:CHD Risk Coronary Heart Disease Risk Table                     Men   Women  1/2 Average Risk   3.4   3.3  Average Risk       5.0   4.4  2 X Average Risk   9.6   7.1  3 X Average Risk  23.4   11.0        Use the calculated Patient Ratio above and the CHD Risk Table to determine the patient's CHD Risk.        ATP III CLASSIFICATION (LDL):  <100     mg/dL   Optimal  100-129  mg/dL   Near or Above                    Optimal  130-159  mg/dL   Borderline  160-189  mg/dL   High  >190     mg/dL   Very High   TRIG 157 (H) 09/15/2011   BNP No results found for:  PROBNP Thyroid Function Tests: No results for input(s): TSH, T4TOTAL, T3FREE, THYROIDAB in the last 72 hours.  Invalid input(s): FREET3    Miscellaneous No results found for: DDIMER  Radiology/Studies:  Dg Chest Portable 1 View  Result Date: 02/03/2018 CLINICAL DATA:  Chest pain. Shortness of breath. Generalized weakness. EXAM: PORTABLE CHEST 1 VIEW  COMPARISON:  Two-view chest x-ray 11/03/2017. FINDINGS: Heart is enlarged. Defibrillator pad is in place. Lung volumes are low. Mild pulmonary vascular congestion is present. No focal airspace disease is evident. IMPRESSION: 1. Cardiomegaly and mild pulmonary vascular congestion without frank edema. Early congestive heart failure is not excluded. 2. No focal airspace disease. Electronically Signed   By: [place] Morelle M.D.   On: 02/03/2018 16:16    EKG: ECGs were reviewed.  She initially presented with a wide-complex tachycardia at 150 bpm.  There was monophasic concordance in the anterior precordium and what appeared to be a monophasic R wave in lead aVR.  Adenosine was given unfortunately no AV block was noted.  The patient was then cardioverted.  Sinus rhythm ensued with first-degree AV block, and IVCD with the initial deflections of all 12 leads being identical to the initial deflections of the tachycardia supporting the hypothesis that the tachycardia represented flutter with 2: 1 conduction.   Assessment and Plan:  Wide-complex tachycardia almost certainly atrial flutter 2: 1 conduction as noted above  Recurrent atrial arrhythmias currently on propafenone having previously been on sotalol and amiodarone  Left ventricular function-normal  Watchman device in place (3/19)  Anticoagulation with aspirin and rivaroxaban  Renal transplant 1/17 with acute kidney injury creatinine now 5.0  Hyperkalemia and hypoglycemia on presentation    The patient presented with wide-complex tachycardia likely atrial flutter for the reasons  outlined above.  The patient was cardioverted urgently and so attention to anticoagulation will be important.  With acute kidney injury, rivaroxaban will need to be held.  We will consult pharmacy for the initiation of heparin with the plan to resume on DOAC if possible.   For some reason, propafenone was utilized as opposed to amiodarone.  The latter might be more effective as it would augment AV nodal blockade.  Patient has followed closely with Dr. Gelene Mink at [facility].  I have reached out to him and hopefully will hear from him tomorrow and come up with a plan  We will follow with you  Reviewed the above with her husband          Virl Axe

## 2018-02-03 NOTE — ED Triage Notes (Signed)
Patient brought in from with [facility] for generalized weakness. Patient reports having heart surgery two months ago, states she had a clot filter placed. Patient alert and oriented and in no apparent distress at this time. Patient denies pain.

## 2018-02-03 NOTE — Consult Note (Signed)
Referring Provider: No ref. provider found Primary Care Physician:  Jane Held, DO Primary Nephrologist:  [name]  Reason for Consultation:   Acute on chronic renal failure  History of renal transplantation 07/19/15  [facility]    HPI:  This is a 72 year old lady followed by [name]   Previously on dialysis in [place]  She had a renal transplant at [facility] 07/19/15 and has history of FSGS   She has gastric banding in the past and a history of DVT with IVC filter  She had a intracranial hemorrhage related to anticoagulation for atrial fibrillation. She takes xarelto and amiodarone. She walks with a cane. She has history of Watchman device placement in march 2019. [facility] with urosepsis. She was last seen by Jane Jimmy [name] 12/30/17. She had some issues with some fluid overload in May and the lasix dose was increased to 80 mg BID  Baseline creatinine1.6 - 1.7    Patient brought to the emergency room with some diarrhea and weakness  Noted to have had an increase in the serum creatinine to 5  Initial K was increased to above 8 but this has improved on recheck     Past Medical History:  Diagnosis Date  . Acute diastolic (congestive) heart failure ([place]) 08/12/2017  . AKI (acute kidney injury) ([place]) 11/04/2017  . Anemia   . Arthritis    HIP  . Asthma    as a child  . Atrial flutter [facility])    s/p aflutter ablation at [facility]  . Blood transfusion    never had a reaction to blood transfusion  . Cardiomyopathy Mar 2013   Mild, EF 50-55% by Mar 2013 ECHO, diast dysfxn II  . Closed fracture of right distal radius 10/18/2016  . CMV (cytomegalovirus infection) ([place]) 10/03/2015  . Constipation    takes Miralax daily  . Depression    takes Zoloft daily  . Distal radius fracture, right   . DVT of lower extremity, bilateral ([place]) 12/21/11   "they're there now; been there for 2 wks"  . Eczema   . ESRD (end stage renal disease) on dialysis [facility]) 09/2011   07-19-2015  had Kidney transplant at [facility]  . Fractures, stress    in both feet--6 OR 7 YRS AGO--RESOLVED  . Generalized edema [id number]  . Gout    doesn't require meds   . HCAP (healthcare-associated pneumonia)    09/10/11  . Hearing difficulty   . Hypercalcemia    09/10/11  . Hyperparathyroidism ([place]) 04/07/2013  . Hypertension    takes Diltiazem daily   . Hypomagnesemia 08/02/2015  . Hypophosphatemia 11/08/2017  . Immunosuppression ([place]) 07/25/2015  . Memory changes   . Morbid obesity ([place])   . Nonischemic dilated cardiomyopathy ([place])   . Obesity 04/07/2013  . Oligouria   . PAF (paroxysmal atrial fibrillation) ([facility])     HX OF CEREBRAL BLEED WHILE ON COUMADIN-SO PT NOT ON ANY BLOOD THINNERS NOW  . Peripheral vascular disease ([place])   . Presence of Watchman left atrial appendage closure device 10/04/2017  . Proteinuria 09/04/2016  . Psoriasis 08/07/2016  . 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 Sepsis ([place]) 11/08/2017  . Sleep apnea    uses CPAP nightly  . Stroke [facility]) 2009   denies residual, hemorrhagic now off coumadin  . Stroke due to intracerebral hemorrhage ([place]) 2009  . Suture reaction 09/04/2016    Past Surgical History:  Procedure Laterality Date  .  AV FISTULA PLACEMENT  09/28/2011   Procedure: ARTERIOVENOUS (AV) FISTULA CREATION;  Surgeon: Jane Posner, MD;  Location: [place];  Service: Vascular;  Laterality: Left;  . CARDIAC VALVE SURGERY  1960    FOR ATRIAL SEPTAL DEFECT  . CHOLECYSTECTOMY     1980's  . COLONOSCOPY    . CYSTOSCOPY     many yrs ago  . FEMUR FRACTURE SURGERY  03/2011; 09/03/2011   right; "had 2, 2 wk apart in 2012; broke it again 08/2011 & had OR"  . I&D EXTREMITY  09/15/2011   Procedure: IRRIGATION AND DEBRIDEMENT EXTREMITY w REMOVAL OF HARDWARE;  Surgeon: Jane Pole, MD;  Location: [place];  Service: Orthopedics;  Laterality: Right;  . INCISION AND DRAINAGE HIP  12/21/2011   Procedure: IRRIGATION AND DEBRIDEMENT HIP;  Surgeon: Jane Pole, MD;   Location: [place];  Service: Orthopedics;  Laterality: Right;  I&D RIGHT HIP WITH PLACEMENT ANTIBIOTIC SPACER  . INSERTION OF DIALYSIS CATHETER     Procedure: INSERTION OF DIALYSIS CATHETER;  Surgeon: Jane Posner, MD;  Location: [place];  Service: Vascular;  Laterality: Right;  . IRRIGATION AND DEBRIDEMENT ABSCESS  12/21/11   right hip  . JOINT REPLACEMENT    . KIDNEY TRANSPLANT  07/19/15  . LAPAROSCOPIC GASTRIC BANDING    . OPEN REDUCTION INTERNAL FIXATION (ORIF) DISTAL RADIAL FRACTURE Right 10/18/2016   Procedure: OPEN REDUCTION INTERNAL FIXATION (ORIF) RIGHT DISTAL RADIAL FRACTURE;  Surgeon: Jane Bond, MD;  Location: [place];  Service: Orthopedics;  Laterality: Right;  . TOTAL HIP ARTHROPLASTY  02/2011   right THA 02/2011, I&D/removal of hardware 09/2011,, repeat I&D Jun 2013, reimplantation R THA 03-26-2012  . TOTAL HIP REVISION  03/25/2012   Procedure: TOTAL HIP REVISION;  Surgeon: Jane Pole, MD;  Location: [facility];  Service: Orthopedics;  Laterality: Right;  Right Total Hip Reimplantation  . VENA CAVA FILTER PLACEMENT  11/2011    Prior to Admission medications   Medication Sig Start Date End Date Taking? Authorizing Provider  acetaminophen (TYLENOL) 500 MG tablet Take 500 mg by mouth every 6 (six) hours as needed.    [provider]  albuterol (PROVENTIL HFA;VENTOLIN HFA) 108 (90 Base) MCG/ACT inhaler Inhale 2 puffs into the lungs every 6 (six) hours as needed for wheezing or shortness of breath. 06/21/17   Jane Held, DO  aspirin EC 81 MG tablet Take 81 mg by mouth. 07/21/15   [provider]  budesonide-formoterol (SYMBICORT) 160-4.5 MCG/ACT inhaler Inhale 2 puffs into the lungs 2 (two) times daily. 10/02/17   [name], Jane Mu, NP  carvedilol (COREG) 25 MG tablet Take 25 mg by mouth 2 (two) times daily with a meal.    [provider]  docusate sodium (COLACE) 100 MG capsule Take 100 mg by mouth daily as needed (For constipation.).      [provider]  ferrous sulfate 325 (65 FE) MG tablet Take 325 mg by mouth daily with breakfast.    [provider]  furosemide (LASIX) 40 MG tablet Take 80 mg by mouth daily.    [provider]  furosemide (LASIX) 80 MG tablet Take 80 mg by mouth 2 (two) times daily.    [provider]  mycophenolate (MYFORTIC) 180 MG EC tablet Take 180 mg by mouth 2 (two) times daily.    [provider]  NONFORMULARY OR COMPOUNDED ITEM Compression stockings --thigh high  20-30 mm hg Dx  Low ext edema 12/10/17  Carollee Herter, Yvonne R, DO  potassium chloride SA (K-DUR,KLOR-CON) 20 MEQ tablet Take 20 mEq by mouth 2 (two) times daily.    [provider]  predniSONE (DELTASONE) 5 MG tablet Take 5 mg by mouth daily with breakfast.    [provider]  propafenone (RYTHMOL) 150 MG tablet Take 150 mg by mouth every 8 (eight) hours.    [provider]  rivaroxaban (XARELTO) 20 MG TABS tablet Take 1 tablet (20 mg total) by mouth daily. 11/04/17   Omar Person, NP  sertraline (ZOLOFT) 50 MG tablet Take 1 tablet (50 mg total) by mouth daily. 04/19/17   Jane Held, DO  Tacrolimus (ENVARSUS XR) 1 MG TB24 Take 1 tablet by mouth daily.    [provider]    Current Facility-Administered Medications  Medication Dose Route Frequency Provider Last Rate Last Dose  . adenosine (ADENOCARD) 6 MG/2ML injection           . calcium gluconate 1 g in sodium chloride 0.9 % 100 mL IVPB  1 g Intravenous Once Duffy [name], MD      . etomidate (AMIDATE) 2 MG/ML injection           . magnesium sulfate IVPB 2 g 50 mL  2 g Intravenous Once Duffy [name], MD   Stopped at 02/03/18 1500   Current Outpatient Medications  Medication Sig Dispense Refill  . acetaminophen (TYLENOL) 500 MG tablet Take 500 mg by mouth every 6 (six) hours as needed.    Marland Kitchen albuterol (PROVENTIL HFA;VENTOLIN HFA) 108 (90 Base) MCG/ACT inhaler Inhale 2 puffs into the lungs  every 6 (six) hours as needed for wheezing or shortness of breath. 18 g 5  . aspirin EC 81 MG tablet Take 81 mg by mouth.    . budesonide-formoterol (SYMBICORT) 160-4.5 MCG/ACT inhaler Inhale 2 puffs into the lungs 2 (two) times daily. 1 Inhaler 0  . carvedilol (COREG) 25 MG tablet Take 25 mg by mouth 2 (two) times daily with a meal.    . docusate sodium (COLACE) 100 MG capsule Take 100 mg by mouth daily as needed (For constipation.).     Marland Kitchen ferrous sulfate 325 (65 FE) MG tablet Take 325 mg by mouth daily with breakfast.    . furosemide (LASIX) 40 MG tablet Take 80 mg by mouth daily.    . furosemide (LASIX) 80 MG tablet Take 80 mg by mouth 2 (two) times daily.    . mycophenolate (MYFORTIC) 180 MG EC tablet Take 180 mg by mouth 2 (two) times daily.    . NONFORMULARY OR COMPOUNDED ITEM Compression stockings --thigh high  20-30 mm hg Dx  Low ext edema 1 each 0  . potassium chloride SA (K-DUR,KLOR-CON) 20 MEQ tablet Take 20 mEq by mouth 2 (two) times daily.    . predniSONE (DELTASONE) 5 MG tablet Take 5 mg by mouth daily with breakfast.    . propafenone (RYTHMOL) 150 MG tablet Take 150 mg by mouth every 8 (eight) hours.    . rivaroxaban (XARELTO) 20 MG TABS tablet Take 1 tablet (20 mg total) by mouth daily. 30 tablet   . sertraline (ZOLOFT) 50 MG tablet Take 1 tablet (50 mg total) by mouth daily. 90 tablet 3  . Tacrolimus (ENVARSUS XR) 1 MG TB24 Take 1 tablet by mouth daily.      Allergies as of 02/03/2018 - Review Complete 02/03/2018  Allergen Reaction Noted  . Ace inhibitors Other (See Comments) 03/28/2012  . Amiodarone Other (  See Comments) 12/25/2017  . Warfarin sodium Other (See Comments) 06/06/2011    Family History  Problem Relation Age of Onset  . Cancer Father   . Diabetes Mother   . Hypertension Mother   . Arthritis Mother   . Hodgkin's lymphoma Unknown 32       dscd---HODGKINS DISEASE  . Hypertension Brother     Social History   Socioeconomic History  . Marital status:  Married    Spouse name: Not on file  . Number of children: Not on file  . Years of education: Not on file  . Highest education level: Not on file  Occupational History  . Occupation: retired  Scientific laboratory technician  . Financial resource strain: Not on file  . Food insecurity:    Worry: Not on file    Inability: Not on file  . Transportation needs:    Medical: Not on file    Non-medical: Not on file  Tobacco Use  . Smoking status: Never Smoker  . Smokeless tobacco: Never Used  Substance and Sexual Activity  . Alcohol use: No  . Drug use: No  . Sexual activity: Not Currently    Birth control/protection: Post-menopausal  Lifestyle  . Physical activity:    Days per week: Not on file    Minutes per session: Not on file  . Stress: Not on file  Relationships  . Social connections:    Talks on phone: Not on file    Gets together: Not on file    Attends religious service: Not on file    Active member of club or organization: Not on file    Attends meetings of clubs or organizations: Not on file    Relationship status: Not on file  . Intimate partner violence:    Fear of current or ex partner: Not on file    Emotionally abused: Not on file    Physically abused: Not on file    Forced sexual activity: Not on file  Other Topics Concern  . Not on file  Social History Narrative   Married; retired; lives in [place]: Gen: weakness no fever or chills  HEENT: No visual complaints, No history of Retinopathy. Normal external appearance No Epistaxis or Sore throat. No sinusitis.   CV: History of atrial fibrillation  Resp: Denies dyspnea at rest,   Morbid obesity  GI:  Diarrhea  Nausea and vomiting  GU : Denies urinary burning, blood in urine, urinary frequency, urinary hesitancy, nocturnal urination, and urinary incontinence.  No renal calculi. MS: Denies joint pain, limitation of movement, and swelling, stiffness, low back pain, extremity pain. Denies muscle weakness,  cramps, atrophy.  No use of non steroidal antiinflammatory drugs. Derm: Denies rash, itching, dry skin, hives, moles, warts, or unhealing ulcers.  Psych: Denies depression, anxiety, memory loss, suicidal ideation, hallucinations, paranoia, and confusion. Heme: Denies bruising, bleeding, and enlarged lymph nodes. Neuro: No headache.  No diplopia. No dysarthria.  No dysphasia.  No history of CVA.  No Seizures. No paresthesias.  No weakness. Endocrine No DM.  No Thyroid disease.  No Adrenal disease.  Physical Exam: Vital signs in last 24 hours: Temp:  [97.2 F (36.2 C)] 97.2 F (36.2 C) (07/22 1358) Pulse Rate:  [120-139] 139 (07/22 1515) Resp:  [16-21] 21 (07/22 1515) BP: (90-131)/(63-76) 90/63 (07/22 1515) SpO2:  [98 %-100 %] 100 % (07/22 1638)   General:   Obese ill appearing  Head:  Normocephalic and atraumatic. Eyes:  Sclera  clear, no icterus.   Conjunctiva pink. Ears:  Normal auditory acuity. Nose:  No deformity, discharge,  or lesions. Mouth:  No deformity or lesions, dentition normal. Neck:  Supple; no masses or thyromegaly. JVP not elevated Lungs:  Clear throughout to auscultation.   No wheezes, crackles, or rhonchi. No acute distress. Heart:  Regular rate and rhythm; 2/6 sem  Abdomen:  Soft, nontender  Obese    Msk:  Symmetrical without gross deformities. Normal posture. Pulses:  No carotid, renal, femoral bruits. DP and PT symmetrical and equal Extremities:  Without clubbing   1 + edema  Neurologic:  Alert and  oriented x4;  grossly normal neurologically. Skin:  Intact without significant lesions or rashes.    Intake/Output from previous day: No intake/output data recorded. Intake/Output this shift: Total I/O In: 1.5 [IV Piggyback:1.5] Out: -   Lab Results: Recent Labs    02/03/18 1400 02/03/18 1425 02/03/18 1458  WBC 13.0*  --   --   HGB 14.4 18.7* 18.4*  HCT 47.9* 55.0* 54.0*  PLT 258  --   --    BMET Recent Labs    02/03/18 1425 02/03/18 1447  02/03/18 1458  NA 129* 135 132*  K 8.3* 5.6* 5.4*  CL 99 92* 97*  CO2  --  18*  --   GLUCOSE 36* 37* 37*  BUN 64* 39* 42*  CREATININE 5.00* 5.00* 5.00*  CALCIUM  --  10.8*  --    LFT Recent Labs    02/03/18 1447  PROT 7.1  ALBUMIN 4.1  AST 76*  ALT 57*  ALKPHOS 74  BILITOT 3.4*   PT/INR No results for input(s): LABPROT, INR in the last 72 hours. Hepatitis Panel No results for input(s): HEPBSAG, HCVAB, HEPAIGM, HEPBIGM in the last 72 hours.  Studies/Results: Dg Chest Portable 1 View  Result Date: 02/03/2018 CLINICAL DATA:  Chest pain. Shortness of breath. Generalized weakness. EXAM: PORTABLE CHEST 1 VIEW COMPARISON:  Two-view chest x-ray 11/03/2017. FINDINGS: Heart is enlarged. Defibrillator pad is in place. Lung volumes are low. Mild pulmonary vascular congestion is present. No focal airspace disease is evident. IMPRESSION: 1. Cardiomegaly and mild pulmonary vascular congestion without frank edema. Early congestive heart failure is not excluded. 2. No focal airspace disease. Electronically Signed   By: San Morelle M.D.   On: 02/03/2018 16:16    Assessment/Plan:   Acute renal failure with serum creatinine that has increased to 5 from baseline of 1.7  She has been on increased dose of diuretics and would think that there could be some volume depletion leading to acute change in creatinine  I would therefore hold the lasix and gently hydrate the patient overnight   We could check a renal ultrasound and some urine studies  That would be helpful   I do not think that this is rejection at this time  She does appear to have obesity and may have some pulmonary hypertension  I think a 2 D echo may also be helpful at this point . I would avoid any nsaids or ace or arb medicaitons  Hyperkalemia  Appears better on recheck  Continue rehydration  Bicarbonate is a little low  Would replete with bicarbonate  Anemia stable  Appears to have increase in hemoglobin  Polycythemia related  to obesity  Immunosuppression  Check tacrolimus level  Atrial fibrillation  Continues on xeralto amiodarone  Underwent watchmans   History of DVT  With filter   LOS: 0 [name] W @TODAY @4 :45 PM

## 2018-02-03 NOTE — Progress Notes (Signed)
Per lab cannot find sufficient vein in right arm for lab draws.  Left arm restricted due to fistula.  [facility] paged with this information.

## 2018-02-03 NOTE — ED Provider Notes (Signed)
[facility] EMERGENCY DEPARTMENT Provider Note   CSN: [id number] Arrival date & time: 02/03/18  1347     History   Chief Complaint Chief Complaint  Patient presents with  . Weakness    HPI Jane WASHINTON is a 72 y.o. female.  HPI 72 year old female with extensive past medical history as below here with generalized weakness.  Patient reportedly has lost her appetite over the last several weeks.  She has not been eating and drinking.  She also had some shortness of breath and had her Lasix increased.  Over the last 24 to 48 hours, she is become increasingly weak.  She is essentially been unable to move around the house.  She feels nauseous and lightheaded.  Denies any chest pain.  She notes she is been urinating consistently, though slightly less than usual.  She has not changed or missed any of her medications.  She has been taking her blood thinner as prescribed.  Denies any fevers.  Denies any diarrhea.  Denies any abdominal pain. Weakness worsens with exertion. Has not been eating/drinking much. No alleviating factors.  Past Medical History:  Diagnosis Date  . Acute diastolic (congestive) heart failure ([place]) 08/12/2017  . AKI (acute kidney injury) ([place]) 11/04/2017  . Anemia   . Arthritis    HIP  . Asthma    as a child  . Atrial flutter [facility])    s/p aflutter ablation at [facility]  . Blood transfusion    never had a reaction to blood transfusion  . Cardiomyopathy Mar 2013   Mild, EF 50-55% by Mar 2013 ECHO, diast dysfxn II  . Closed fracture of right distal radius 10/18/2016  . CMV (cytomegalovirus infection) ([place]) 10/03/2015  . Constipation    takes Miralax daily  . Depression    takes Zoloft daily  . Distal radius fracture, right   . DVT of lower extremity, bilateral ([place]) 12/21/11   "they're there now; been there for 2 wks"  . Eczema   . ESRD (end stage renal disease) on dialysis [facility]) 09/2011   07-19-2015 had Kidney transplant at [facility]  . Fractures, stress      in both feet--6 OR 7 YRS AGO--RESOLVED  . Generalized edema [id number]  . Gout    doesn't require meds   . HCAP (healthcare-associated pneumonia)    09/10/11  . Hearing difficulty   . Hypercalcemia    09/10/11  . Hyperparathyroidism ([place]) 04/07/2013  . Hypertension    takes Diltiazem daily   . Hypomagnesemia 08/02/2015  . Hypophosphatemia 11/08/2017  . Immunosuppression ([place]) 07/25/2015  . Memory changes   . Morbid obesity ([place])   . Nonischemic dilated cardiomyopathy ([place])   . Obesity 04/07/2013  . Oligouria   . PAF (paroxysmal atrial fibrillation) (HCC)     HX OF CEREBRAL BLEED WHILE ON COUMADIN-SO PT NOT ON ANY BLOOD THINNERS NOW  . Peripheral vascular disease ([place])   . Presence of Watchman left atrial appendage closure device 10/04/2017  . Proteinuria 09/04/2016  . Psoriasis 08/07/2016  . 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 Sepsis ([place]) 11/08/2017  . Sleep apnea    uses CPAP nightly  . Stroke [facility]) 2009   denies residual, hemorrhagic now off coumadin  . Stroke due to intracerebral hemorrhage ([place]) 2009  . Suture reaction 09/04/2016    Patient Active Problem List   Diagnosis Date Noted  . Hyperkalemia 02/03/2018  . Atrial fibrillation with RVR ([place]) 02/03/2018  . Abscess of right leg 12/14/2017  .  Lower extremity edema 12/10/2017  . Acute kidney injury superimposed on chronic kidney disease ([place]) 11/17/2017  . Acute diastolic (congestive) heart failure ([place]) 11/17/2017  . UTI (urinary tract infection) 11/03/2017  . Diarrhea 11/03/2017  . Septic shock ([place]) 11/03/2017  . Chronic respiratory failure with hypoxia ([place]) 10/03/2017  . Dyspnea 07/26/2017  . Atrial flutter ([place])   . Peripheral vascular disease ([place])   . Oligouria   . Hypertension   . Fractures, stress   . Eczema   . Distal radius fracture, right   . Depression   . Constipation   . Arthritis   . Exacerbation of asthma 04/01/2017  . Closed fracture of right distal radius 10-19-16  .  Deceased-donor kidney transplant recipient 09/13/2015  . Nonischemic dilated cardiomyopathy ([place])   . Skin ulceration ([place]) 05/09/2013  . Abscess of shoulder 05/15/2012  . Expected blood loss anemia 03/26/2012  . S/P right TH revision 03/25/2012  . Abscess of hip, right 12/24/2011  . DVT of lower extremity, bilateral ([place]) 12/21/2011  . History of DVT (deep vein thrombosis) 12/08/2011  . OSA on CPAP 12/08/2011  . End stage renal disease ([place]) 11/06/2011  . 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 Pseudomonas aeruginosa infection 10/24/2011  . ESRD (end stage renal disease) on dialysis ([place]) 09/14/2011  . Hypercalcemia   . Community acquired pneumonia of left upper lobe of lung ([place]) 09/10/2011  . Gout 09/10/2011  . Absence of right hip joint with antibiotic pacer 09/03/2011  . Traumatic seroma of thigh ([place]) 06/11/2011  . Clostridium difficile colitis 06/09/2011  . Leukocytosis 06/08/2011  . Anemia 06/06/2011  . ARF (acute renal failure) ([place]) 06/06/2011  . SINUSITIS- ACUTE-NOS 09/27/2010  . DEPRESSION 11/25/2008  . PAF (paroxysmal atrial fibrillation) ([place]) 11/25/2008  . RENAL FAILURE, ACUTE 11/25/2008  . CVA 09/28/2008  . 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 WEAKNESS, LEFT SIDE OF BODY 09/21/2008  . Morbid obesity ([place]) 10/20/2007  . Stroke due to intracerebral hemorrhage ([place]) 07/17/2007  . Stroke ([place]) 07/17/2007  . Essential hypertension 07/07/2007  . Asthma 07/07/2007    Past Surgical History:  Procedure Laterality Date  . AV FISTULA PLACEMENT  09/28/2011   Procedure: ARTERIOVENOUS (AV) FISTULA CREATION;  Surgeon: Rosetta Posner, MD;  Location: [facility];  Service: Vascular;  Laterality: Left;  . CARDIAC VALVE SURGERY  1960    FOR ATRIAL SEPTAL DEFECT  . CHOLECYSTECTOMY     1980's  . COLONOSCOPY    . CYSTOSCOPY     many yrs ago  . FEMUR FRACTURE SURGERY  03/2011; 09/03/2011   right; "had 2, 2 wk apart in 2012; broke it again 08/2011 & had OR"    . I&D EXTREMITY  09/15/2011   Procedure: IRRIGATION AND DEBRIDEMENT EXTREMITY w REMOVAL OF HARDWARE;  Surgeon: [name], MD;  Location: [facility];  Service: Orthopedics;  Laterality: Right;  . INCISION AND DRAINAGE HIP  12/21/2011   Procedure: IRRIGATION AND DEBRIDEMENT HIP;  Surgeon: [name], MD;  Location: [facility];  Service: Orthopedics;  Laterality: Right;  I&D RIGHT HIP WITH PLACEMENT ANTIBIOTIC SPACER  . INSERTION OF DIALYSIS CATHETER     Procedure: INSERTION OF DIALYSIS CATHETER;  Surgeon: Rosetta Posner, MD;  Location: [facility];  Service: Vascular;  Laterality: Right;  . IRRIGATION AND DEBRIDEMENT ABSCESS  12/21/11   right hip  . JOINT REPLACEMENT    . KIDNEY TRANSPLANT  07/19/15  . LAPAROSCOPIC GASTRIC BANDING    . OPEN REDUCTION INTERNAL FIXATION (  ORIF) DISTAL RADIAL FRACTURE Right 10/18/2016   Procedure: OPEN REDUCTION INTERNAL FIXATION (ORIF) RIGHT DISTAL RADIAL FRACTURE;  Surgeon: Marchia Bond, MD;  Location: [facility];  Service: Orthopedics;  Laterality: Right;  . TOTAL HIP ARTHROPLASTY  02/2011   right THA 02/2011, I&D/removal of hardware 09/2011,, repeat I&D Jun 2013, reimplantation R THA 03-26-2012  . TOTAL HIP REVISION  03/25/2012   Procedure: TOTAL HIP REVISION;  Surgeon: [name], MD;  Location: [facility];  Service: Orthopedics;  Laterality: Right;  Right Total Hip Reimplantation  . VENA CAVA FILTER PLACEMENT  11/2011     OB History   None      Home Medications    Prior to Admission medications   Medication Sig Start Date End Date Taking? Authorizing Provider  acetaminophen (TYLENOL) 500 MG tablet Take 500 mg by mouth every 6 (six) hours as needed.    [provider]  albuterol (PROVENTIL HFA;VENTOLIN HFA) 108 (90 Base) MCG/ACT inhaler Inhale 2 puffs into the lungs every 6 (six) hours as needed for wheezing or shortness of breath. 06/21/17   [name], DO  aspirin EC 81 MG tablet Take 81 mg by mouth. 07/21/15   [provider]   budesonide-formoterol (SYMBICORT) 160-4.5 MCG/ACT inhaler Inhale 2 puffs into the lungs 2 (two) times daily. 10/02/17   Parrett, Fonnie Mu, NP  carvedilol (COREG) 25 MG tablet Take 25 mg by mouth 2 (two) times daily with a meal.    [provider]  docusate sodium (COLACE) 100 MG capsule Take 100 mg by mouth daily as needed (For constipation.).     [provider]  ferrous sulfate 325 (65 FE) MG tablet Take 325 mg by mouth daily with breakfast.    [provider]  furosemide (LASIX) 40 MG tablet Take 80 mg by mouth daily.    [provider]  furosemide (LASIX) 80 MG tablet Take 80 mg by mouth 2 (two) times daily.    [provider]  mycophenolate (MYFORTIC) 180 MG EC tablet Take 180 mg by mouth 2 (two) times daily.    [provider]  NONFORMULARY OR COMPOUNDED ITEM Compression stockings --thigh high  20-30 mm hg Dx  Low ext edema 12/10/17   Carollee Herter, Alferd Apa, DO  potassium chloride SA (K-DUR,KLOR-CON) 20 MEQ tablet Take 20 mEq by mouth.     [provider]  predniSONE (DELTASONE) 5 MG tablet Take 5 mg by mouth daily with breakfast.    [provider]  propafenone (RYTHMOL) 150 MG tablet Take 150 mg by mouth every 8 (eight) hours.    [provider]  rivaroxaban (XARELTO) 20 MG TABS tablet Take 1 tablet (20 mg total) by mouth daily. 11/04/17   Omar Person, NP  sertraline (ZOLOFT) 50 MG tablet Take 1 tablet (50 mg total) by mouth daily. 04/19/17   [name], DO  Tacrolimus (ENVARSUS XR) 1 MG TB24 Take 1 tablet by mouth daily.    [provider]    Family History Family History  Problem Relation Age of Onset  . Cancer Father   . Diabetes Mother   . Hypertension Mother   . Arthritis Mother   . Hodgkin's lymphoma Unknown 32       dscd---[facility] DISEASE  . Hypertension Brother     Social History Social History   Tobacco Use  . Smoking status: Never Smoker  . Smokeless tobacco:  Never Used  Substance Use Topics  .  Alcohol use: No  . Drug use: No     Allergies   Ace inhibitors; Amiodarone; and Warfarin sodium   Review of Systems Review of Systems  Constitutional: Positive for fatigue. Negative for chills and fever.  HENT: Negative for congestion and rhinorrhea.   Eyes: Negative for visual disturbance.  Respiratory: Negative for cough, shortness of breath and wheezing.   Cardiovascular: Negative for chest pain and leg swelling.  Gastrointestinal: Positive for nausea. Negative for abdominal pain, diarrhea and vomiting.  Genitourinary: Negative for dysuria and flank pain.  Musculoskeletal: Negative for neck pain and neck stiffness.  Skin: Negative for rash and wound.  Allergic/Immunologic: Negative for immunocompromised state.  Neurological: Positive for weakness and light-headedness. Negative for syncope and headaches.  All other systems reviewed and are negative.    Physical Exam Updated Vital Signs BP (!) 157/84   Pulse 72   Temp (!) 97.5 F (36.4 C) (Oral)   Resp (!) 23   SpO2 97%   Physical Exam  Constitutional: She is oriented to person, place, and time. She appears well-developed and well-nourished. No distress.  HENT:  Head: Normocephalic and atraumatic.  Dry mucous membranes  Eyes: Conjunctivae are normal.  Neck: Neck supple.  Cardiovascular: Regular rhythm and normal heart sounds. Tachycardia present. Exam reveals no friction rub.  No murmur heard. Pulmonary/Chest: Effort normal and breath sounds normal. No respiratory distress. She has no wheezes. She has no rales.  Abdominal: She exhibits no distension.  Musculoskeletal: She exhibits no edema.  AVF LUE  Neurological: She is alert and oriented to person, place, and time. She exhibits normal muscle tone.  Skin: Skin is warm. Capillary refill takes less than 2 seconds.  Psychiatric: She has a normal mood and affect.  Nursing note and vitals reviewed.    ED Treatments / Results    Labs (all labs ordered are listed, but only abnormal results are displayed) Labs Reviewed  CBC - Abnormal; Notable for the following components:      Result Value   WBC 13.0 (*)    RBC 5.50 (*)    HCT 47.9 (*)    RDW 21.4 (*)    All other components within normal limits  COMPREHENSIVE METABOLIC PANEL - Abnormal; Notable for the following components:   Potassium 5.6 (*)    Chloride 92 (*)    CO2 18 (*)    Glucose, Bld 37 (*)    BUN 39 (*)    Creatinine, Ser 5.00 (*)    Calcium 10.8 (*)    AST 76 (*)    ALT 57 (*)    Total Bilirubin 3.4 (*)    GFR calc non Af Amer 8 (*)    GFR calc Af Amer 9 (*)    Anion gap 25 (*)    All other components within normal limits  CBG MONITORING, ED - Abnormal; Notable for the following components:   Glucose-Capillary 15 (*)    All other components within normal limits  I-STAT CHEM 8, ED - Abnormal; Notable for the following components:   Sodium 129 (*)    Potassium 8.3 (*)    BUN 64 (*)    Creatinine, Ser 5.00 (*)    Glucose, Bld 36 (*)    Calcium, Ion 0.95 (*)    TCO2 21 (*)    Hemoglobin 18.7 (*)    HCT 55.0 (*)    All other components within normal limits  I-STAT TROPONIN, ED - Abnormal; Notable for the following components:  Troponin i, poc 0.36 (*)    All other components within normal limits  I-STAT CG4 LACTIC ACID, ED - Abnormal; Notable for the following components:   Lactic Acid, Venous 10.22 (*)    All other components within normal limits  CBG MONITORING, ED - Abnormal; Notable for the following components:   Glucose-Capillary 154 (*)    All other components within normal limits  I-STAT CHEM 8, ED - Abnormal; Notable for the following components:   Sodium 132 (*)    Potassium 5.4 (*)    Chloride 97 (*)    BUN 42 (*)    Creatinine, Ser 5.00 (*)    Glucose, Bld 37 (*)    Calcium, Ion 1.05 (*)    Hemoglobin 18.4 (*)    HCT 54.0 (*)    All other components within normal limits  CULTURE, BLOOD (ROUTINE X 2)  CULTURE,  BLOOD (ROUTINE X 2)  URINE CULTURE  CYTOMEGALOVIRUS (CMV) CULTURE - CMVCUL  MAGNESIUM  URINALYSIS, ROUTINE W REFLEX MICROSCOPIC  TACROLIMUS LEVEL  TACROLIMUS LEVEL  RENAL FUNCTION PANEL  LACTIC ACID, PLASMA  LACTIC ACID, PLASMA  PROCALCITONIN  BASIC METABOLIC PANEL  CBC  TROPONIN I  TROPONIN I  TROPONIN I  BK QUANT PCR (PLASMA/SERUM)  APTT  HEPARIN LEVEL (UNFRACTIONATED)  CBG MONITORING, ED  CBG MONITORING, ED    EKG EKG Interpretation  Date/Time:  Monday February 03 2018 13:56:38 EDT Ventricular Rate:  125 PR Interval:    QRS Duration: 182 QT Interval:  432 QTC Calculation: 624 R Axis:   -14 Text Interpretation:  Extreme tachycardia with wide complex, no further rhythm analysis attempted ST depression V1-V3, suggest recording posterior leads Since last EKG, WCT has replaced AFib Confirmed by Duffy [name] [phone]) on 02/03/2018 7:52:27 PM   Radiology Dg Chest Portable 1 View  Result Date: 02/03/2018 CLINICAL DATA:  Chest pain. Shortness of breath. Generalized weakness. EXAM: PORTABLE CHEST 1 VIEW COMPARISON:  Two-view chest x-ray 11/03/2017. FINDINGS: Heart is enlarged. Defibrillator pad is in place. Lung volumes are low. Mild pulmonary vascular congestion is present. No focal airspace disease is evident. IMPRESSION: 1. Cardiomegaly and mild pulmonary vascular congestion without frank edema. Early congestive heart failure is not excluded. 2. No focal airspace disease. Electronically Signed   By: San Morelle M.D.   On: 02/03/2018 16:16    Procedures .Critical Care Performed by: Duffy [name], MD Authorized by: Duffy [name], MD   Critical care provider statement:    Critical care time (minutes):  70   Critical care time was exclusive of:  Separately billable procedures and treating other patients and teaching time   Critical care was necessary to treat or prevent imminent or life-threatening deterioration of the following conditions:  Cardiac failure,  circulatory failure, respiratory failure and metabolic crisis   Critical care was time spent personally by me on the following activities:  Development of treatment plan with patient or surrogate, discussions with consultants, evaluation of patient's response to treatment, examination of patient, obtaining history from patient or surrogate, ordering and performing treatments and interventions, ordering and review of laboratory studies, ordering and review of radiographic studies, pulse oximetry, re-evaluation of patient's condition and review of old charts   I assumed direction of critical care for this patient from another provider in my specialty: no   .Cardioversion Date/Time: 02/03/2018 7:53 PM Performed by: Duffy [name], MD Authorized by: Duffy [name], MD   Consent:    Consent obtained:  Emergent situation   Consent given by:  Patient   Risks discussed:  Cutaneous burn, death, induced arrhythmia and pain   Alternatives discussed:  Alternative treatment Pre-procedure details:    Cardioversion basis:  Emergent   Rhythm:  Ventricular tachycardia   Electrode placement:  Anterior-posterior Patient sedated: Yes. Refer to sedation procedure documentation for details of sedation.  Attempt one:    Cardioversion mode:  Synchronous   Waveform:  Biphasic   Shock (Joules):  120   Shock outcome:  Conversion to other rhythm Post-procedure details:    Patient status:  Awake   Patient tolerance of procedure:  Tolerated well, no immediate complications .Sedation Date/Time: 02/03/2018 7:54 PM Performed by: Duffy [name], MD Authorized by: Duffy [name], MD   Consent:    Consent obtained:  Verbal   Consent given by:  Patient   Risks discussed:  Allergic reaction, dysrhythmia, inadequate sedation, nausea, prolonged hypoxia resulting in organ damage, prolonged sedation necessitating reversal, respiratory compromise necessitating ventilatory assistance and intubation and vomiting    Alternatives discussed:  Analgesia without sedation, anxiolysis and regional anesthesia Universal protocol:    Procedure explained and questions answered to patient or proxy's satisfaction: yes     Relevant documents present and verified: yes     Test results available and properly labeled: yes     Imaging studies available: yes     Required blood products, implants, devices, and special equipment available: yes     Site/side marked: yes     Immediately prior to procedure a time out was called: yes     Patient identity confirmation method:  Verbally with patient Indications:    Procedure necessitating sedation performed by:  Physician performing sedation   Intended level of sedation:  Deep Pre-sedation assessment:    Time since last food or drink:  4   ASA classification: class 1 - normal, healthy patient     Neck mobility: normal     Mouth opening:  3 or more finger widths   Thyromental distance:  4 finger widths   Mallampati score:  I - soft palate, uvula, fauces, pillars visible   Pre-sedation assessments completed and reviewed: airway patency, cardiovascular function, hydration status, mental status, nausea/vomiting, pain level, respiratory function and temperature   Immediate pre-procedure details:    Reassessment: Patient reassessed immediately prior to procedure     Reviewed: vital signs, relevant labs/tests and NPO status     Verified: bag valve mask available, emergency equipment available, intubation equipment available, IV patency confirmed, oxygen available and suction available   Procedure details (see MAR for exact dosages):    Preoxygenation:  Nasal cannula   Sedation:  Propofol   Intra-procedure monitoring:  Blood pressure monitoring, cardiac monitor, continuous pulse oximetry, frequent LOC assessments, frequent vital sign checks and continuous capnometry   Intra-procedure events: none     Total Provider sedation time (minutes):  20 Post-procedure details:     Attendance: Constant attendance by certified staff until patient recovered     Recovery: Patient returned to pre-procedure baseline     Post-sedation assessments completed and reviewed: airway patency, cardiovascular function, hydration status, mental status, nausea/vomiting, pain level, respiratory function and temperature     Patient is stable for discharge or admission: yes     Patient tolerance:  Tolerated well, no immediate complications   (including critical care time)  Emergency Ultrasound Study:   Angiocath insertion Performed by: Evonnie Pat Consent: Verbal consent/emergent consent obtained. Risks and benefits: risks, benefits and alternatives were discussed Immediately prior to procedure the correct patient, procedure,  equipment, support staff and site/side marked as needed.  Indication: difficult IV access Preparation: Patient was prepped and draped in the usual sterile fashion. Sterile gel was used for this procedure and the ultrasound probe was sterilized prior to use. Vein Location: Right brachial vein was visualized during assessment for potential access sites and was found to be patent/ easily compressed with linear ultrasound.  The needle was visualized with real-time ultrasound and guided into the vein. Gauge: 20 Image saved and stored.  Normal blood return.   Patient tolerance: Patient tolerated the procedure well with no immediate complications.   Emergency Ultrasound Study:   Angiocath insertion Performed by: Evonnie Pat Consent: Verbal consent/emergent consent obtained. Risks and benefits: risks, benefits and alternatives were discussed Immediately prior to procedure the correct patient, procedure, equipment, support staff and site/side marked as needed.  Indication: difficult IV access Preparation: Patient was prepped and draped in the usual sterile fashion. Sterile gel was used for this procedure and the ultrasound probe was sterilized prior to  use. Vein Location: Right cephalic vein was visualized during assessment for potential access sites and was found to be patent/ easily compressed with linear ultrasound.  The needle was visualized with real-time ultrasound and guided into the vein. Gauge: 20 Image saved and stored.  Normal blood return.   Patient tolerance: Patient tolerated the procedure well with no immediate complications.          Medications Ordered in ED Medications  magnesium sulfate IVPB 2 g 50 mL (0 g Intravenous Hold 02/03/18 1500)  sodium bicarbonate tablet 650 mg (has no administration in time range)  albuterol (PROVENTIL) (2.5 MG/3ML) 0.083% nebulizer solution 2.5 mg (has no administration in time range)  aspirin EC tablet 81 mg (has no administration in time range)  mometasone-formoterol (DULERA) 200-5 MCG/ACT inhaler 2 puff (has no administration in time range)  predniSONE (DELTASONE) tablet 5 mg (has no administration in time range)  propafenone (RYTHMOL) tablet 150 mg (has no administration in time range)  sertraline (ZOLOFT) tablet 50 mg (has no administration in time range)  Tacrolimus TB24 1 tablet (has no administration in time range)  acetaminophen (TYLENOL) tablet 650 mg (has no administration in time range)    Or  acetaminophen (TYLENOL) suppository 650 mg (has no administration in time range)  HYDROcodone-acetaminophen (NORCO/VICODIN) 5-325 MG per tablet 1-2 tablet (has no administration in time range)  senna-docusate (Senokot-S) tablet 1 tablet (has no administration in time range)  bisacodyl (DULCOLAX) suppository 10 mg (has no administration in time range)  ondansetron (ZOFRAN) tablet 4 mg (has no administration in time range)    Or  ondansetron (ZOFRAN) injection 4 mg (has no administration in time range)  sodium bicarbonate injection 50 mEq (50 mEq Intravenous Given 02/03/18 1415)  calcium gluconate 2 g in sodium chloride 0.9 % 100 mL IVPB (0 g Intravenous Stopped 02/03/18 1455)  sodium  bicarbonate 1 mEq/mL injection (50 mEq  Given 02/03/18 1446)  dextrose 50 % solution 50 mL (50 mLs Intravenous Given 02/03/18 1446)  albuterol (PROVENTIL,VENTOLIN) solution continuous neb (10 mg/hr Nebulization Given 02/03/18 1450)  sodium chloride 0.9 % bolus 500 mL (0 mLs Intravenous Stopped 02/03/18 1546)  sodium chloride 0.9 % bolus 1,000 mL (0 mLs Intravenous Stopped 02/03/18 1631)  etomidate (AMIDATE) injection ( Intravenous Canceled Entry 02/03/18 1630)  etomidate (AMIDATE) injection (5 mg Intravenous Given 02/03/18 1628)  adenosine (ADENOCARD) 6 MG/2ML injection ( Intravenous Canceled Entry 02/03/18 1630)     Initial Impression / Assessment and Plan /  ED Course  I have reviewed the triage vital signs and the nursing notes.  Pertinent labs & imaging results that were available during my care of the patient were reviewed by me and considered in my medical decision making (see chart for details).     72 year old female here with generalized weakness in the setting of poor p.o. intake and recent increase in Lasix dosing.  Concern for ventricular tachycardia secondary to hyperkalemia.  Patient was given multiple doses of bicarb and calcium on arrival, as well as magnesium given some concern for possible torsades.  Initial labs hemolyzed, but repeat does show acute on chronic kidney injury with creatinine of 5 and bicarb of 18 with hyperkalemia.  She has been appropriately temporized.  Dr. Caryl Comes of cardiology consulted, as concern for ventricular tachycardia versus a flutter with aberrancy (LBBB noted on EKG read from [facility]).  Decision made with Dr. Caryl Comes to administer adenosine, which had no effect, so patient was subsequently cardioverted with 120 J.  Patient tolerated well and converted to what appears to be A. fib.  This is her baseline.  Patient feels improved.  Discussed with Dr. Justin Mend, of nephrology as well, will admit for likely prerenal AKI precipitating V. tach, which is resolved.  Final  Clinical Impressions(s) / ED Diagnoses   Final diagnoses:  Wide-complex tachycardia ([place])  AKI (acute kidney injury) ([place])  Hyperkalemia    ED Discharge Orders    None       Duffy [name], MD 02/03/18 Karl Bales

## 2018-02-03 NOTE — Progress Notes (Signed)
RT NOTE: Called to room for standby during cardioversion. Placed patient on 2lpm nasal cannula with ETCO2 monitoring per MD.

## 2018-02-03 NOTE — Progress Notes (Signed)
Pt refuse NIV for the night. Pt states that "she wants to off on it for the night" machine left in the room in case patient changes her mind, or possibly to use tomorrow night. No distress or complications noted.

## 2018-02-03 NOTE — ED Notes (Signed)
Lab results was reported to Nurse Carlis Abbott.

## 2018-02-03 NOTE — Progress Notes (Signed)
ANTICOAGULATION CONSULT NOTE - Initial Consult  Pharmacy Consult for Heparin Indication: atrial fibrillation  Allergies  Allergen Reactions  . Ace Inhibitors Other (See Comments)    Reaction unknown  . 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 Warfarin Sodium Other (See Comments)    Caused her to have a stroke    Patient Measurements: Ht: 5'5" Wt: 100.4 Heparin Dosing Weight: 80 kg  Vital Signs: Temp: 97.5 F (36.4 C) (07/22 1859) Temp Source: Oral (07/22 1859) BP: 157/84 (07/22 1859) Pulse Rate: 72 (07/22 1715)  Labs: Recent Labs    02/03/18 1400 02/03/18 1425 02/03/18 1447 02/03/18 1458  HGB 14.4 18.7*  --  18.4*  HCT 47.9* 55.0*  --  54.0*  PLT 258  --   --   --   CREATININE  --  5.00* 5.00* 5.00*    CrCl cannot be calculated (Unknown ideal weight.).   Medical History: Past Medical History:  Diagnosis Date  . Acute diastolic (congestive) heart failure ([place]) 08/12/2017  . AKI (acute kidney injury) ([place]) 11/04/2017  . Anemia   . Arthritis    HIP  . Asthma    as a child  . Atrial flutter [facility])    s/p aflutter ablation at [facility]  . Blood transfusion    never had a reaction to blood transfusion  . Cardiomyopathy Mar 2013   Mild, EF 50-55% by Mar 2013 ECHO, diast dysfxn II  . Closed fracture of right distal radius 10/18/2016  . CMV (cytomegalovirus infection) ([place]) 10/03/2015  . Constipation    takes Miralax daily  . Depression    takes Zoloft daily  . Distal radius fracture, right   . DVT of lower extremity, bilateral ([place]) 12/21/11   "they're there now; been there for 2 wks"  . Eczema   . ESRD (end stage renal disease) on dialysis [facility]) 09/2011   07-19-2015 had Kidney transplant at [facility]  . Fractures, stress    in both feet--6 OR 7 YRS AGO--RESOLVED  . Generalized edema [id number]  . Gout    doesn't require meds   . HCAP (healthcare-associated pneumonia)    09/10/11  . Hearing difficulty    . Hypercalcemia    09/10/11  . Hyperparathyroidism ([place]) 04/07/2013  . Hypertension    takes Diltiazem daily   . Hypomagnesemia 08/02/2015  . Hypophosphatemia 11/08/2017  . Immunosuppression ([place]) 07/25/2015  . Memory changes   . Morbid obesity ([place])   . Nonischemic dilated cardiomyopathy ([place])   . Obesity 04/07/2013  . Oligouria   . PAF (paroxysmal atrial fibrillation) (HCC)     HX OF CEREBRAL BLEED WHILE ON COUMADIN-SO PT NOT ON ANY BLOOD THINNERS NOW  . Peripheral vascular disease ([place])   . Presence of Watchman left atrial appendage closure device 10/04/2017  . Proteinuria 09/04/2016  . Psoriasis 08/07/2016  . 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 Sepsis ([place]) 11/08/2017  . Sleep apnea    uses CPAP nightly  . Stroke [facility]) 2009   denies residual, hemorrhagic now off coumadin  . Stroke due to intracerebral hemorrhage ([place]) 2009  . Suture reaction 09/04/2016    Medications:  Scheduled:  . aspirin EC  81 mg Oral Daily  . mometasone-formoterol  2 puff Inhalation BID  . [START ON 02/04/2018] predniSONE  5 mg Oral Q breakfast  . propafenone  150 mg Oral Q8H  . sertraline  50 mg Oral Daily  . sodium bicarbonate  650 mg Oral BID  . Tacrolimus  1 tablet Oral Daily    Assessment: 27 yof presenting for evaluation of WCT today. Has had recurrent atrial fibrillation/flutter with multiple agents tried - currently on propafenone. On Xarelto PTA for Afib - last dose on 7/21 in PM.   Hgb 18.4, plt 258. No s/sx of bleeding. Scr elevated highly at 5 (baseline 1.7), with hx of renal transplant in 2017. Given recent Xarelto dose, will monitor both aPTT and heparin level until correlate. Will start Xarelto dosing approximately 24 hours from last dose.   Goal of Therapy:  Heparin level 0.3-0.7 units/ml aPTT 66-102 seconds Monitor platelets by anticoagulation protocol: Yes   Plan:  Start heparin infusion at 1100 units/hr tonight at 2100 Check anti-Xa and aPTT level in 8 hours and  daily while on heparin Continue to monitor H&H and platelets  Doylene Canard, PharmD Clinical Pharmacist  Pager: [phone] Phone: [phone] 02/03/2018,7:38 PM

## 2018-02-03 NOTE — ED Notes (Signed)
Critical Lab results reported to Dr. Ellender Hose.

## 2018-02-03 NOTE — H&P (Signed)
History and Physical    [name] XBM:[id number] DOB: May 24, 1946 DOA: 02/03/2018   PCP: [name], DO   Patient coming from:  Home    Chief Complaint: Diarrhea and generalized weakness   HPI: [name] is a 72 y.o. female with medical history significant for ESRD [facility], then status post renal transplant that [facility] you on 07/19/2015, history of DVT status post IVC filter due to intracranial hemorrhage related to anticoagulation at the time, history of atrial fibrillation on Xarelto, currently on watchman device placement in March 2019, recent admission to [facility] in April 2019 for urosepsis, brought to the emergency department with generalized weakness, poor oral intake, diarrhea, but denies any recent food illness, or viral GI.  She has been compliant with her medications.  She denies any fever or chills.  She denies any abdominal pain, or vomiting.  She denies any shortness of breath, or cough.  No recent sick contacts.  No recent long distance trip.  On presentation, she denies any chest pain or palpitations.  She does complain of peripheral edema, and recently had some issues with fluid overload, with Lasix being increased to 80 mg twice daily.  No increased confusion.  She denies any headache or vision changes.  No unilateral weakness.   ED Course:  BP 138/66   Pulse 69   Temp (!) 97.2 F (36.2 C) (Temporal)   Resp (!) 24   SpO2 100%   At the ED, the patient was noted to have hyperkalemia, with a potassium of 8.3, and the EKG was concerning for V. tach secondary to hyperkalemia.  For which he received bicarb, calcium, magnesium, given concern for possible torsades. Cardiology evaluation was obtained, due to concern for V. tach versus a flutter with aberrancy.  Dr. Caryl Comes recommended to administer adenosine, which had no effect, so the patient was subsequently cardioverted with 120 J, tolerating it well, and converting to what appeared to be sinus  rhythm, with occasional runs of A. fib.  This is her baseline.  Overall, she feels improved. Moreover, her creatinine was found to be 5, for which nephrology was consulted, Dr. Justin Mend.  He suspected likely prerenal AKI which had precipitated these V. tach, now resolved. Admission was 2.1 Troponin was 0.36, with next troponin pending Lactic acid was 10.22 White count 13 Hemoglobin 18.4, platelets 258    Review of Systems:  As per HPI otherwise all other systems reviewed and are negative  Past Medical History:  Diagnosis Date  . Acute diastolic (congestive) heart failure ([place]) 08/12/2017  . AKI (acute kidney injury) ([place]) 11/04/2017  . Anemia   . Arthritis    HIP  . Asthma    as a child  . Atrial flutter [facility])    s/p aflutter ablation at [facility]  . Blood transfusion    never had a reaction to blood transfusion  . Cardiomyopathy Mar 2013   Mild, EF 50-55% by Mar 2013 ECHO, diast dysfxn II  . Closed fracture of right distal radius 10/18/2016  . CMV (cytomegalovirus infection) ([place]) 10/03/2015  . Constipation    takes Miralax daily  . Depression    takes Zoloft daily  . Distal radius fracture, right   . DVT of lower extremity, bilateral ([place]) 12/21/11   "they're there now; been there for 2 wks"  . Eczema   . ESRD (end stage renal disease) on dialysis [facility]) 09/2011   07-19-2015 had Kidney transplant at [facility]  .  Fractures, stress    in both feet--6 OR 7 YRS AGO--RESOLVED  . Generalized edema [id number]  . Gout    doesn't require meds   . HCAP (healthcare-associated pneumonia)    09/10/11  . Hearing difficulty   . Hypercalcemia    09/10/11  . Hyperparathyroidism ([place]) 04/07/2013  . Hypertension    takes Diltiazem daily   . Hypomagnesemia 08/02/2015  . Hypophosphatemia 11/08/2017  . Immunosuppression ([place]) 07/25/2015  . Memory changes   . Morbid obesity ([place])   . Nonischemic dilated cardiomyopathy ([place])   . Obesity 04/07/2013  . Oligouria   . PAF (paroxysmal atrial fibrillation)  (HCC)     HX OF CEREBRAL BLEED WHILE ON COUMADIN-SO PT NOT ON ANY BLOOD THINNERS NOW  . Peripheral vascular disease ([place])   . Presence of Watchman left atrial appendage closure device 10/04/2017  . Proteinuria 09/04/2016  . Psoriasis 08/07/2016  . 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 Sepsis ([place]) 11/08/2017  . Sleep apnea    uses CPAP nightly  . Stroke [facility]) 2009   denies residual, hemorrhagic now off coumadin  . Stroke due to intracerebral hemorrhage ([place]) 2009  . Suture reaction 09/04/2016    Past Surgical History:  Procedure Laterality Date  . AV FISTULA PLACEMENT  09/28/2011   Procedure: ARTERIOVENOUS (AV) FISTULA CREATION;  Surgeon: Rosetta Posner, MD;  Location: [place];  Service: Vascular;  Laterality: Left;  . CARDIAC VALVE SURGERY  1960    FOR ATRIAL SEPTAL DEFECT  . CHOLECYSTECTOMY     1980's  . COLONOSCOPY    . CYSTOSCOPY     many yrs ago  . FEMUR FRACTURE SURGERY  03/2011; 09/03/2011   right; "had 2, 2 wk apart in 2012; broke it again 08/2011 & had OR"  . I&D EXTREMITY  09/15/2011   Procedure: IRRIGATION AND DEBRIDEMENT EXTREMITY w REMOVAL OF HARDWARE;  Surgeon: [name], MD;  Location: [place];  Service: Orthopedics;  Laterality: Right;  . INCISION AND DRAINAGE HIP  12/21/2011   Procedure: IRRIGATION AND DEBRIDEMENT HIP;  Surgeon: [name], MD;  Location: [place];  Service: Orthopedics;  Laterality: Right;  I&D RIGHT HIP WITH PLACEMENT ANTIBIOTIC SPACER  . INSERTION OF DIALYSIS CATHETER     Procedure: INSERTION OF DIALYSIS CATHETER;  Surgeon: Rosetta Posner, MD;  Location: [place];  Service: Vascular;  Laterality: Right;  . IRRIGATION AND DEBRIDEMENT ABSCESS  12/21/11   right hip  . JOINT REPLACEMENT    . KIDNEY TRANSPLANT  07/19/15  . LAPAROSCOPIC GASTRIC BANDING    . OPEN REDUCTION INTERNAL FIXATION (ORIF) DISTAL RADIAL FRACTURE Right 10/18/2016   Procedure: OPEN REDUCTION INTERNAL FIXATION (ORIF) RIGHT DISTAL RADIAL FRACTURE;  Surgeon: Marchia Bond, MD;  Location:  [place];  Service: Orthopedics;  Laterality: Right;  . TOTAL HIP ARTHROPLASTY  02/2011   right THA 02/2011, I&D/removal of hardware 09/2011,, repeat I&D Jun 2013, reimplantation R THA 03-26-2012  . TOTAL HIP REVISION  03/25/2012   Procedure: TOTAL HIP REVISION;  Surgeon: [name], MD;  Location: [facility];  Service: Orthopedics;  Laterality: Right;  Right Total Hip Reimplantation  . VENA CAVA FILTER PLACEMENT  11/2011    Social History Social History   Socioeconomic History  . Marital status: Married    Spouse name: Not on file  . Number of children: Not on file  . Years of education: Not on file  . Highest education level: Not on file  Occupational History  . Occupation:  retired  Scientific laboratory technician  . Financial resource strain: Not on file  . Food insecurity:    Worry: Not on file    Inability: Not on file  . Transportation needs:    Medical: Not on file    Non-medical: Not on file  Tobacco Use  . Smoking status: Never Smoker  . Smokeless tobacco: Never Used  Substance and Sexual Activity  . Alcohol use: No  . Drug use: No  . Sexual activity: Not Currently    Birth control/protection: Post-menopausal  Lifestyle  . Physical activity:    Days per week: Not on file    Minutes per session: Not on file  . Stress: Not on file  Relationships  . Social connections:    Talks on phone: Not on file    Gets together: Not on file    Attends religious service: Not on file    Active member of club or organization: Not on file    Attends meetings of clubs or organizations: Not on file    Relationship status: Not on file  . Intimate partner violence:    Fear of current or ex partner: Not on file    Emotionally abused: Not on file    Physically abused: Not on file    Forced sexual activity: Not on file  Other Topics Concern  . Not on file  Social History Narrative   Married; retired; lives in [place]  . Ace Inhibitors Other  (See Comments)    Reaction unknown  . 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 Warfarin Sodium Other (See Comments)    Caused her to have a stroke    Family History  Problem Relation Age of Onset  . Cancer Father   . Diabetes Mother   . Hypertension Mother   . Arthritis Mother   . Hodgkin's lymphoma Unknown 32       dscd---HODGKINS DISEASE  . Hypertension Brother        Prior to Admission medications   Medication Sig Start Date End Date Taking? Authorizing Provider  acetaminophen (TYLENOL) 500 MG tablet Take 500 mg by mouth every 6 (six) hours as needed.    [provider]  albuterol (PROVENTIL HFA;VENTOLIN HFA) 108 (90 Base) MCG/ACT inhaler Inhale 2 puffs into the lungs every 6 (six) hours as needed for wheezing or shortness of breath. 06/21/17   [name], DO  aspirin EC 81 MG tablet Take 81 mg by mouth. 07/21/15   [provider]  budesonide-formoterol (SYMBICORT) 160-4.5 MCG/ACT inhaler Inhale 2 puffs into the lungs 2 (two) times daily. 10/02/17   Parrett, Fonnie Mu, NP  carvedilol (COREG) 25 MG tablet Take 25 mg by mouth 2 (two) times daily with a meal.    [provider]  docusate sodium (COLACE) 100 MG capsule Take 100 mg by mouth daily as needed (For constipation.).     [provider]  ferrous sulfate 325 (65 FE) MG tablet Take 325 mg by mouth daily with breakfast.    [provider]  furosemide (LASIX) 40 MG tablet Take 80 mg by mouth daily.    [provider]  furosemide (LASIX) 80 MG tablet Take 80 mg by mouth 2 (two) times daily.    [provider]  mycophenolate (MYFORTIC) 180 MG EC tablet Take 180 mg by mouth 2 (two) times daily.  [provider]  NONFORMULARY OR COMPOUNDED ITEM Compression stockings --thigh high  20-30 mm hg Dx  Low ext edema 12/10/17   Carollee Herter, Alferd Apa, DO  potassium chloride SA (K-DUR,KLOR-CON) 20 MEQ  tablet Take 20 mEq by mouth 2 (two) times daily.    [provider]  predniSONE (DELTASONE) 5 MG tablet Take 5 mg by mouth daily with breakfast.    [provider]  propafenone (RYTHMOL) 150 MG tablet Take 150 mg by mouth every 8 (eight) hours.    [provider]  rivaroxaban (XARELTO) 20 MG TABS tablet Take 1 tablet (20 mg total) by mouth daily. 11/04/17   Omar Person, NP  sertraline (ZOLOFT) 50 MG tablet Take 1 tablet (50 mg total) by mouth daily. 04/19/17   [name], DO  Tacrolimus (ENVARSUS XR) 1 MG TB24 Take 1 tablet by mouth daily.    [provider]     Physical Exam:  Vitals:   02/03/18 1615 02/03/18 1630 02/03/18 1638 02/03/18 1645  BP: (!) 106/29 (!) 142/65  138/66  Pulse: (!) 146 (!) 35  69  Resp: (!) 24 (!) 24  (!) 24  Temp:      TempSrc:      SpO2: 98% 100% 100% 100%   Constitutional: NAD, calm, ill appearing  eyes: PERRL, lids and conjunctivae normal.  Exophthalmia noted ENMT: Mucous membranes are dry, without exudate or lesions  Neck: normal, supple, no masses, no thyromegaly Respiratory: clear to auscultation bilaterally, no wheezing, no crackles. Normal respiratory effort  Cardiovascular: Regular rate and rhythm, 2 out of 6 murmur, rubs or gallops.  1+ bilateral extremity edema. 2+ pedal pulses. No carotid bruits.  Abdomen: Soft, non tender, No hepatosplenomegaly. Bowel sounds positive.  Musculoskeletal: no clubbing / cyanosis. Moves all extremities Skin: no jaundice, No lesions.  Neurologic: Sensation intact  Strength equal in all extremities Psychiatric:   Alert and oriented x 3. Normal mood.  Flat affect    Labs on Admission: I have personally reviewed following labs and imaging studies  CBC: Recent Labs  Lab 02/03/18 1400 02/03/18 1425 02/03/18 1458  WBC 13.0*  --   --   HGB 14.4 18.7* 18.4*  HCT 47.9* 55.0* 54.0*  MCV 87.1  --   --   PLT 258  --   --     Basic Metabolic Panel: Recent Labs    Lab 02/03/18 1425 02/03/18 1447 02/03/18 1458  NA 129* 135 132*  K 8.3* 5.6* 5.4*  CL 99 92* 97*  CO2  --  18*  --   GLUCOSE 36* 37* 37*  BUN 64* 39* 42*  CREATININE 5.00* 5.00* 5.00*  CALCIUM  --  10.8*  --   MG  --  2.1  --     GFR: CrCl cannot be calculated (Unknown ideal weight.).  Liver Function Tests: Recent Labs  Lab 02/03/18 1447  AST 76*  ALT 57*  ALKPHOS 74  BILITOT 3.4*  PROT 7.1  ALBUMIN 4.1   No results for input(s): LIPASE, AMYLASE in the last 168 hours. No results for input(s): AMMONIA in the last 168 hours.  Coagulation Profile: No results for input(s): INR, PROTIME in the last 168 hours.  Cardiac Enzymes: No results for input(s): CKTOTAL, CKMB, CKMBINDEX, TROPONINI in the last 168 hours.  BNP (last 3 results) No results for input(s): PROBNP in the last 8760 hours.  HbA1C: No results for input(s): HGBA1C in the last 72 hours.  CBG: Recent Labs  Lab 02/03/18 1439 02/03/18 1506 02/03/18 1537  GLUCAP 15* 82 154*    Lipid Profile: No results for input(s): CHOL, HDL, LDLCALC, TRIG, CHOLHDL, LDLDIRECT in the last 72 hours.  Thyroid Function Tests: No results for input(s): TSH, T4TOTAL, FREET4, T3FREE, THYROIDAB in the last 72 hours.  Anemia Panel: No results for input(s): VITAMINB12, FOLATE, FERRITIN, TIBC, IRON, RETICCTPCT in the last 72 hours.  Urine analysis:    Component Value Date/Time   COLORURINE AMBER (A) 11/03/2017 1805   APPEARANCEUR CLOUDY (A) 11/03/2017 1805   LABSPEC 1.014 11/03/2017 1805   PHURINE 5.0 11/03/2017 1805   GLUCOSEU NEGATIVE 11/03/2017 1805   HGBUR LARGE (A) 11/03/2017 1805   HGBUR negative 10/04/2009 1044   BILIRUBINUR negative 12/13/2017 1508   KETONESUR NEGATIVE 11/03/2017 1805   PROTEINUR Negative 12/13/2017 1508   PROTEINUR 30 (A) 11/03/2017 1805   UROBILINOGEN 0.2 12/13/2017 1508   UROBILINOGEN 0.2 03/25/2012 1354   NITRITE negative 12/13/2017 1508   NITRITE NEGATIVE 11/03/2017 1805    LEUKOCYTESUR Negative 12/13/2017 1508    Sepsis Labs: @LABRCNTIP (procalcitonin:4,lacticidven:4) )No results found for this or any previous visit (from the past 240 hour(s)).   Radiological Exams on Admission: Dg Chest Portable 1 View  Result Date: 02/03/2018 CLINICAL DATA:  Chest pain. Shortness of breath. Generalized weakness. EXAM: PORTABLE CHEST 1 VIEW COMPARISON:  Two-view chest x-ray 11/03/2017. FINDINGS: Heart is enlarged. Defibrillator pad is in place. Lung volumes are low. Mild pulmonary vascular congestion is present. No focal airspace disease is evident. IMPRESSION: 1. Cardiomegaly and mild pulmonary vascular congestion without frank edema. Early congestive heart failure is not excluded. 2. No focal airspace disease. Electronically Signed   By: San Morelle M.D.   On: 02/03/2018 16:16    EKG: Independently reviewed.  Assessment/Plan Active Problems:   Morbid obesity ([place])   Essential hypertension   PAF (paroxysmal atrial fibrillation) (HCC)   Asthma   Anemia   Gout   History of DVT (deep vein thrombosis)   OSA on CPAP   Nonischemic dilated cardiomyopathy (HCC)   Deceased-donor kidney transplant recipient   Stroke due to intracerebral hemorrhage ([place])   DVT of lower extremity, bilateral (HCC)   Chronic respiratory failure with hypoxia (HCC)   Acute kidney injury superimposed on chronic kidney disease (HCC)   Hyperkalemia    Atrial Fibrillation on anticoagulation with Xarelto, however, at the ED, the patient had rapid ventricular rate, concern for V. tach, versus a flutter with aberrancy.Dr. Caryl Comes recommended to administer adenosine, which had no effect, so the patient was subsequently cardioverted with 120 J, tolerating it well, and converting to what appeared to be sinus rhythm, with occasional runs of A. fib.  This is her baseline.  Overall, she feels improved.  Troponin at the time was 0.36.  Patient is status post watchman's Admit to stepdown with telemetry  monitoring Heparin drip, hold Xarelto Serial troponin and EKG Appreciate cardiology evaluation and follow-up  Acute renal failure, in the setting of poor oral intake, diarrhea: Patient has a history of renal transplant in 2017, baseline creatinine is between 1.5 and 1.7.  She is on tacrolimus, and she is compliant with her medications most recently, her Lasix had been increased to 80 mg twice daily oral.  On presentation, her creatinine was 5.  Nephrology consultation was obtained, recommending holding the Lasix, and gently hydrating the patient overnight. Renal ultrasound was ordered, follow-up results. Tacrolimus level as per nephrology  Continue prednisone Check urinalysis Avoid NSAIDs, ACE inhibitor or ARB Continue amiodarone  Gently hydrate Hold Lasix Check chemistries in a.m. Chck CMV, and BK serology in view of recent diarrhea  Acute hyperkalemia Hyperkalemia protocol has been followed in the ER  Recheck BMET, initially was at 8, at 5.4  repeat EKG as indicated Check Troponin Telemetry IVF Avoid ACE inhibitor  History of DVT, status post IVC filter.  Currently on Xarelto Xarelto is on hold at this time, but she is being placed on heparin drip due to above. No acute issues, will continue to follow   Polycythemia, in the setting of obesity, OSA, COPD,.  White count 13, hemoglobin 18.4, platelets 258.  Patient is afebrile. Repeat CBC in a.m. We will hold antibiotics for now. CPAP nightly Hold iron for now   Depression, continue Zoloft  Deconditioning, PT OT consult  DVT prophylaxis: Heparin drip Code Status:    Full code Family Communication:  Discussed with patient Disposition Plan: Expect patient to be discharged to home after condition improves Consults called:    Cardiology, Dr. Caryl Comes, and nephrology, Dr. Justin Mend Admission status: Telemetry stepdown   Sharene Butters, PA-C [facility]   Amion text  [phone]   02/03/2018, 5:38 PM

## 2018-02-04 ENCOUNTER — Other Ambulatory Visit: Payer: Self-pay

## 2018-02-04 ENCOUNTER — Inpatient Hospital Stay (HOSPITAL_COMMUNITY): Payer: Medicare Other

## 2018-02-04 ENCOUNTER — Encounter (HOSPITAL_COMMUNITY): Payer: Self-pay

## 2018-02-04 DIAGNOSIS — I361 Nonrheumatic tricuspid (valve) insufficiency: Secondary | ICD-10-CM

## 2018-02-04 DIAGNOSIS — I4891 Unspecified atrial fibrillation: Secondary | ICD-10-CM

## 2018-02-04 LAB — CBC
HCT: 39.3 % (ref 36.0–46.0)
Hemoglobin: 12.3 g/dL (ref 12.0–15.0)
MCH: 26.1 pg (ref 26.0–34.0)
MCHC: 31.3 g/dL (ref 30.0–36.0)
MCV: 83.4 fL (ref 78.0–100.0)
Platelets: 199 10*3/uL (ref 150–400)
RBC: 4.71 MIL/uL (ref 3.87–5.11)
RDW: 20.2 % — ABNORMAL HIGH (ref 11.5–15.5)
WBC: 10.9 10*3/uL — ABNORMAL HIGH (ref 4.0–10.5)

## 2018-02-04 LAB — APTT
aPTT: >200 seconds (ref 24–36)
aPTT: >200 seconds (ref 24–36)
aPTT: >200 seconds (ref 24–36)

## 2018-02-04 LAB — URINALYSIS, ROUTINE W REFLEX MICROSCOPIC
Bilirubin Urine: NEGATIVE
Glucose, UA: NEGATIVE mg/dL
Ketones, ur: NEGATIVE mg/dL
Nitrite: NEGATIVE
Protein, ur: 100 mg/dL — AB
Specific Gravity, Urine: 1.017 (ref 1.005–1.030)
pH: 5 (ref 5.0–8.0)

## 2018-02-04 LAB — RENAL FUNCTION PANEL
Albumin: 3.3 g/dL — ABNORMAL LOW (ref 3.5–5.0)
Anion gap: 14 (ref 5–15)
BUN: 39 mg/dL — ABNORMAL HIGH (ref 8–23)
CO2: 23 mmol/L (ref 22–32)
Calcium: 8.6 mg/dL — ABNORMAL LOW (ref 8.9–10.3)
Chloride: 102 mmol/L (ref 98–111)
Creatinine, Ser: 4.65 mg/dL — ABNORMAL HIGH (ref 0.44–1.00)
GFR calc Af Amer: 10 mL/min — ABNORMAL LOW (ref >60)
GFR calc non Af Amer: 9 mL/min — ABNORMAL LOW (ref >60)
Glucose, Bld: 88 mg/dL (ref 70–99)
Phosphorus: 5 mg/dL — ABNORMAL HIGH (ref 2.5–4.6)
Potassium: 4.7 mmol/L (ref 3.5–5.1)
Sodium: 139 mmol/L (ref 135–145)

## 2018-02-04 LAB — POCT I-STAT, CHEM 8
BUN: 64 mg/dL — ABNORMAL HIGH (ref 8–23)
Calcium, Ion: 0.95 mmol/L — ABNORMAL LOW (ref 1.15–1.40)
Chloride: 99 mmol/L (ref 98–111)
Creatinine, Ser: 5 mg/dL — ABNORMAL HIGH (ref 0.44–1.00)
Glucose, Bld: 36 mg/dL — CL (ref 70–99)
HCT: 55 % — ABNORMAL HIGH (ref 36.0–46.0)
Hemoglobin: 18.7 g/dL — ABNORMAL HIGH (ref 12.0–15.0)
Potassium: 8.3 mmol/L (ref 3.5–5.1)
Sodium: 129 mmol/L — ABNORMAL LOW (ref 135–145)
TCO2: 21 mmol/L — ABNORMAL LOW (ref 22–32)

## 2018-02-04 LAB — BASIC METABOLIC PANEL
Anion gap: 14 (ref 5–15)
BUN: 39 mg/dL — ABNORMAL HIGH (ref 8–23)
CO2: 23 mmol/L (ref 22–32)
Calcium: 8.7 mg/dL — ABNORMAL LOW (ref 8.9–10.3)
Chloride: 102 mmol/L (ref 98–111)
Creatinine, Ser: 4.71 mg/dL — ABNORMAL HIGH (ref 0.44–1.00)
GFR calc Af Amer: 10 mL/min — ABNORMAL LOW (ref >60)
GFR calc non Af Amer: 9 mL/min — ABNORMAL LOW (ref >60)
Glucose, Bld: 86 mg/dL (ref 70–99)
Potassium: 4.7 mmol/L (ref 3.5–5.1)
Sodium: 139 mmol/L (ref 135–145)

## 2018-02-04 LAB — HEPARIN LEVEL (UNFRACTIONATED): Heparin Unfractionated: >2.2 IU/mL — ABNORMAL HIGH (ref 0.30–0.70)

## 2018-02-04 LAB — LACTIC ACID, PLASMA: Lactic Acid, Venous: 2.4 mmol/L (ref 0.5–1.9)

## 2018-02-04 LAB — ECHOCARDIOGRAM COMPLETE
Height: 65 in
Weight: 3541.47 oz

## 2018-02-04 LAB — TACROLIMUS LEVEL: Tacrolimus (FK506) - LabCorp: 13.9 ng/mL (ref 2.0–20.0)

## 2018-02-04 LAB — PROCALCITONIN: Procalcitonin: 0.57 ng/mL

## 2018-02-04 LAB — TROPONIN I: Troponin I: 0.38 ng/mL (ref <0.03)

## 2018-02-04 MED ORDER — FUROSEMIDE 10 MG/ML IJ SOLN
80.0000 mg | Freq: Once | INTRAMUSCULAR | Status: AC
  Administered 2018-02-04: 80 mg via INTRAVENOUS
  Filled 2018-02-04: qty 8

## 2018-02-04 MED ORDER — TACROLIMUS 1 MG PO TB24
2.0000 mg | ORAL_TABLET | Freq: Every day | ORAL | Status: DC
  Administered 2018-02-04 – 2018-02-05 (×2): 2 mg via ORAL
  Filled 2018-02-04 (×3): qty 2

## 2018-02-04 MED ORDER — SODIUM CHLORIDE 0.9 % IV SOLN
INTRAVENOUS | Status: AC
  Administered 2018-02-04 – 2018-02-05 (×2): via INTRAVENOUS

## 2018-02-04 MED ORDER — HEPARIN (PORCINE) IN NACL 100-0.45 UNIT/ML-% IJ SOLN
800.0000 [IU]/h | INTRAMUSCULAR | Status: DC
Start: 2018-02-04 — End: 2018-02-05
  Administered 2018-02-04 (×2): 800 [IU]/h via INTRAVENOUS
  Filled 2018-02-04: qty 250

## 2018-02-04 MED ORDER — SODIUM CHLORIDE 0.9 % IV BOLUS
1000.0000 mL | Freq: Once | INTRAVENOUS | Status: AC
  Administered 2018-02-04: 1000 mL via INTRAVENOUS

## 2018-02-04 MED ORDER — NEPRO/CARBSTEADY PO LIQD: 237.0000 mL | Freq: Two times a day (BID) | ORAL | Status: DC

## 2018-02-04 MED ORDER — CARVEDILOL 3.125 MG PO TABS
3.1250 mg | ORAL_TABLET | Freq: Two times a day (BID) | ORAL | Status: DC
  Administered 2018-02-04 – 2018-02-05 (×2): 3.125 mg via ORAL
  Filled 2018-02-04 (×2): qty 1

## 2018-02-04 NOTE — Progress Notes (Addendum)
@IPLOG @        PROGRESS NOTE                                                                                                                                                                                                             Patient Demographics:    [name], is a 72 y.o. female, DOB - 23-Nov-1945, MGQ:[id number]  Admit date - 02/03/2018   Admitting Physician Donne Hazel, MD  Outpatient Primary MD for the patient is Carollee Herter, Alferd Apa, DO  LOS - 1  Chief Complaint  Patient presents with  . 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 fib RVR, she was cardioverted in the ER, cardiology and nephrology were consulted and she was admitted to the hospital service.   Subjective:    [name] today has, No headache, No chest pain, does complain of some suprapubic abdominal pain - No Nausea, No new weakness tingling or numbness, No Cough - SOB.    Assessment  & Plan :    1.  Paroxysmal atrial flutter with 2 is to 1 conduction likely through an aberrancy with wide-complex tachycardia.  Mali vas 2 score of at least 4.  Cardioverted in the ER, currently in sinus, EP following, continue propafenone per cardiology, currently on heparin drip instead of Xarelto due to worsening renal function.  2.  Oliguric ARF on CKD 4 in a patient with transplant kidneys.  Baseline creatinine 1.7, this appears to be prerenal azotemia, nephrology on board, avoid nephrotoxins, hydrate and  monitor.  Foley catheter is to monitor urine output on 02/04/2018, urine output extremely poor on 02/04/2018, discussed with Dr. Justin Mend nephrologist, challenge with IV fluid bolus and Lasix IV.  I will also talk to [facility] renal transplant service for a potential transfer.  3.  Urinary retention.  See #2 above.  Will check UA with culture and sensitivity.  4.  HX of DVT.  Status post IVC filter, currently Xarelto on hold and on heparin drip due to #2 above.  5.  Polycythemia.  Most likely due to severe dehydration better with IV fluids continue to monitor.  6.  OSA.  CPAP at night.  7.  HX of chronic systolic heart failure known EF around 25%.  Currently no acute issues, compensated, no ACE/ARB due to #2 above, once hemodynamically stable low-dose beta-blocker can be tried.  For now we will avoid hypotension due to worsening renal failure.    Diet :  Diet Order           Diet renal with fluid restriction Fluid restriction: 1200 mL Fluid; Room service appropriate? Yes; Fluid consistency: Thin  Diet effective now           Family Communication  :  husband  Code Status :  Full  Disposition Plan  : Home once renal function is improved versus [facility] transfer.   Consults  : Nephrology, EP  Procedures  :   Renal ultrasound with no hydronephrosis of the transplanted kidney.    Cardioversion in the ER  DVT Prophylaxis  :   Heparin gtt  Lab Results  Component Value Date   PLT 199 02/04/2018    Inpatient Medications  Scheduled Meds: . feeding supplement (NEPRO CARB STEADY)  237 mL Oral BID BM  . mometasone-formoterol  2 puff Inhalation BID  . predniSONE  5 mg Oral Q breakfast  . propafenone  150 mg Oral Q8H  . sertraline  50 mg Oral Daily  . sodium bicarbonate  650 mg Oral BID  . Tacrolimus  2 mg Oral Daily   Continuous Infusions: . sodium chloride 75 mL/hr at 02/04/18 1110  . heparin 1,100 Units/hr (02/04/18 0247)  . magnesium sulfate 1 - 4 g bolus IVPB Stopped  (02/03/18 1500)   PRN Meds:.acetaminophen **OR** acetaminophen, albuterol, bisacodyl, HYDROcodone-acetaminophen, ondansetron **OR** ondansetron (ZOFRAN) IV, senna-docusate  Antibiotics  :    Anti-infectives (From admission, onward)   Start     Dose/Rate Route Frequency Ordered Stop   02/03/18 1630  vancomycin (VANCOCIN) 1,500 mg in sodium chloride 0.9 % 500 mL IVPB  Status:  Discontinued     1,500 mg 250 mL/hr over 120 Minutes Intravenous  Once 02/03/18 1613 02/03/18 1635   02/03/18 1615  piperacillin-tazobactam (ZOSYN) IVPB 3.375 g  Status:  Discontinued     3.375 g 100 mL/hr over 30 Minutes Intravenous  Once 02/03/18 1607 02/03/18 1635         Objective:   Vitals:   02/03/18 1859 02/04/18 0017 02/04/18 0353 02/04/18 0835  BP: (!) 157/84 (!) 153/75    Pulse:  76 78   Resp: (!) 23 (!) 23    Temp: (!) 97.5 F (36.4 C) (!) 97.4 F (36.3 C) 98.3 F (36.8 C)   TempSrc: Oral Oral Oral   SpO2: 97% 92% 91% 93%  Weight:   100.4 kg (221 lb 5.5 oz)   Height:        Wt Readings from Last 3 Encounters:  02/04/18 100.4 kg (221 lb 5.5 oz)  12/31/17 97.2 kg (214 lb 3.2 oz)  12/16/17 97.3 kg (214 lb 9.6 oz)     Intake/Output Summary (Last 24 hours) at 02/04/2018 1119 Last data filed at 02/04/2018 0247 Gross per 24 hour  Intake 1991.11 ml  Output -  Net 1991.11 ml     Physical Exam  Awake Alert, Oriented X 3, No new F.N deficits, Normal affect [place].AT,PERRAL Supple Neck,No JVD, No cervical lymphadenopathy appriciated.  Symmetrical Chest wall movement, Good air movement bilaterally, CTAB RRR,No Gallops,Rubs  or new Murmurs, No Parasternal Heave +ve B.Sounds, Abd Soft, mild suprapubic fullness and tenderness, No organomegaly appriciated, No rebound - guarding or rigidity. No Cyanosis, Clubbing or edema, No new Rash or bruise       Data Review:    CBC Recent Labs  Lab 02/03/18 1400 02/03/18 1425 02/03/18 1458 02/04/18 0611  WBC 13.0*  --   --  10.9*  HGB 14.4 18.7*  18.4* 12.3  HCT 47.9* 55.0* 54.0* 39.3  PLT 258  --   --  199  MCV 87.1  --   --  83.4  MCH 26.2  --   --  26.1  MCHC 30.1  --   --  31.3  RDW 21.4*  --   --  20.2*    Chemistries  Recent Labs  Lab 02/03/18 1425 02/03/18 1447 02/03/18 1458 02/04/18 0611  NA 129* 135 132* 139  139  K 8.3* 5.6* 5.4* 4.7  4.7  CL 99 92* 97* 102  102  CO2  --  18*  --  23  23  GLUCOSE 36* 37* 37* 86  88  BUN 64* 39* 42* 39*  39*  CREATININE 5.00* 5.00* 5.00* 4.71*  4.65*  CALCIUM  --  10.8*  --  8.7*  8.6*  MG  --  2.1  --   --   AST  --  76*  --   --   ALT  --  57*  --   --   ALKPHOS  --  74  --   --   BILITOT  --  3.4*  --   --    ------------------------------------------------------------------------------------------------------------------ No results for input(s): CHOL, HDL, LDLCALC, TRIG, CHOLHDL, LDLDIRECT in the last 72 hours.  Lab Results  Component Value Date   HGBA1C  11/16/2008    5.5 (NOTE) The ADA recommends the following therapeutic goal for glycemic control related to Hgb A1c measurement: Goal of therapy: <6.5 Hgb A1c  Reference: American Diabetes Association: Clinical Practice Recommendations 2010, Diabetes Care, 2010, 33: (Suppl  1).   ------------------------------------------------------------------------------------------------------------------ No results for input(s): TSH, T4TOTAL, T3FREE, THYROIDAB in the last 72 hours.  Invalid input(s): FREET3 ------------------------------------------------------------------------------------------------------------------ No results for input(s): VITAMINB12, FOLATE, FERRITIN, TIBC, IRON, RETICCTPCT in the last 72 hours.  Coagulation profile No results for input(s): INR, PROTIME in the last 168 hours.  No results for input(s): DDIMER in the last 72 hours.  Cardiac Enzymes Recent Labs  Lab 02/04/18 0611  TROPONINI 0.38*    ------------------------------------------------------------------------------------------------------------------    Component Value Date/Time   BNP 525.4 (H) 11/03/2017 1805    Micro Results No results found for this or any previous visit (from the past 240 hour(s)).  Radiology Reports US Renal Transplant W/doppler  Result Date: 02/03/2018 CLINICAL DATA:  Acute renal failure. EXAM: ULTRASOUND OF RENAL TRANSPLANT WITH RENAL DOPPLER ULTRASOUND TECHNIQUE: Ultrasound examination of the renal transplant was performed with gray-scale, color and duplex doppler evaluation. Technologist describes technically difficult study with limited vascular evaluation attributed to body habitus and overlying bowel gas. COMPARISON:  None. FINDINGS: Transplant kidney location: Right lower quadrant Transplant Kidney: Length: 11 cm. Normal in size and parenchymal echogenicity. No evidence of mass or hydronephrosis. No peri-transplant fluid collection seen. Color flow in the main renal artery:  Limited evaluation Color flow in the main renal vein:  Limited evaluation Duplex Doppler Evaluation: Main Renal Artery Resistive Index: Not obtained Venous waveform in main renal vein:  Not demonstrated Intrarenal resistive index in upper pole:  Not obtained (  normal 0.6-0.8; equivocal 0.8-0.9; abnormal >= 0.9) Intrarenal resistive index in lower pole: Not obtained (normal 0.6-0.8; equivocal 0.8-0.9; abnormal >= 0.9) Bladder: Normal for degree of bladder distention. Unilateral ureteral jet documented. Other findings: There is a 8.9 x 5.5 x 6.3 cm deep subcutaneous fluid collection superior and lateral to the transplant kidney without significant mass effect upon the kidney or collecting system. IMPRESSION: 1. No hydronephrosis of transplant kidney. Limited vascular evaluation. 2. 8.9 cm deep subcutaneous fluid collection superior and lateral to the transplant kidney without significant mass effect on the kidney. Electronically Signed    By: Lucrezia Europe M.D.   On: 02/03/2018 21:52   Dg Chest Portable 1 View  Result Date: 02/03/2018 CLINICAL DATA:  Chest pain. Shortness of breath. Generalized weakness. EXAM: PORTABLE CHEST 1 VIEW COMPARISON:  Two-view chest x-ray 11/03/2017. FINDINGS: Heart is enlarged. Defibrillator pad is in place. Lung volumes are low. Mild pulmonary vascular congestion is present. No focal airspace disease is evident. IMPRESSION: 1. Cardiomegaly and mild pulmonary vascular congestion without frank edema. Early congestive heart failure is not excluded. 2. No focal airspace disease. Electronically Signed   By: San Morelle M.D.   On: 02/03/2018 16:16    Time Spent in minutes  30   Lala Lund M.D on 02/04/2018 at 11:19 AM  To page go to [email or web address] - password [facility]

## 2018-02-04 NOTE — Progress Notes (Signed)
[facility] ROUNDING NOTE   Subjective:   Appears bright and alert today  No recorded urine output  No loose stools last night   Objective:  Vital signs in last 24 hours:  Temp:  [97.2 F (36.2 C)-98.3 F (36.8 C)] 98.3 F (36.8 C) (07/23 0353) Pulse Rate:  [35-146] 78 (07/23 0353) Resp:  [16-41] 23 (07/23 0017) BP: (90-162)/(29-84) 153/75 (07/23 0017) SpO2:  [91 %-100 %] 93 % (07/23 0835) Weight:  [221 lb 5.1 oz (100.4 kg)-221 lb 5.5 oz (100.4 kg)] 221 lb 5.5 oz (100.4 kg) (07/23 0353)  Weight change:  Filed Weights   02/03/18 1500 02/04/18 0353  Weight: 221 lb 5.1 oz (100.4 kg) 221 lb 5.5 oz (100.4 kg)    Intake/Output: I/O last 3 completed shifts: In: 1991.1 [P.O.:100; I.V.:389; IV Piggyback:1502.1] Out: -    Intake/Output this shift:  No intake/output data recorded.  CVS- RRR RS- CTA     93% room air ABD-  Diminished sounds  Non tender  EXT-  1 + LE edema   Basic Metabolic Panel: Recent Labs  Lab 02/03/18 1425 02/03/18 1447 02/03/18 1458 02/04/18 0611  NA 129* 135 132* 139  139  K 8.3* 5.6* 5.4* 4.7  4.7  CL 99 92* 97* 102  102  CO2  --  18*  --  23  23  GLUCOSE 36* 37* 37* 86  88  BUN 64* 39* 42* 39*  39*  CREATININE 5.00* 5.00* 5.00* 4.71*  4.65*  CALCIUM  --  10.8*  --  8.7*  8.6*  MG  --  2.1  --   --   PHOS  --   --   --  5.0*    Liver Function Tests: Recent Labs  Lab 02/03/18 1447 02/04/18 0611  AST 76*  --   ALT 57*  --   ALKPHOS 74  --   BILITOT 3.4*  --   PROT 7.1  --   ALBUMIN 4.1 3.3*   No results for input(s): LIPASE, AMYLASE in the last 168 hours. No results for input(s): AMMONIA in the last 168 hours.  CBC: Recent Labs  Lab 02/03/18 1400 02/03/18 1425 02/03/18 1458 02/04/18 0611  WBC 13.0*  --   --  10.9*  HGB 14.4 18.7* 18.4* 12.3  HCT 47.9* 55.0* 54.0* 39.3  MCV 87.1  --   --  83.4  PLT 258  --   --  199    Cardiac Enzymes: Recent Labs  Lab 02/04/18 0611  TROPONINI 0.38*     BNP: Invalid input(s): POCBNP  CBG: Recent Labs  Lab 02/03/18 1439 02/03/18 1506 02/03/18 1537  GLUCAP 15* 82 154*    Microbiology: Results for orders placed or performed in visit on 12/10/17  Urine Culture     Status: None   Collection Time: 12/10/17 12:16 PM  Result Value Ref Range Status   MICRO NUMBER: [id number]  Final   SPECIMEN QUALITY: ADEQUATE  Final   Sample Source NOT GIVEN  Final   STATUS: FINAL  Final   ISOLATE 1:   Final    Three or more organisms present, each greater than 10,000 cu/mL. May represent normal flora contamination from external genitalia. No further testing is required.    Coagulation Studies: No results for input(s): LABPROT, INR in the last 72 hours.  Urinalysis: No results for input(s): COLORURINE, LABSPEC, PHURINE, GLUCOSEU, HGBUR, BILIRUBINUR, KETONESUR, PROTEINUR, UROBILINOGEN, NITRITE, LEUKOCYTESUR in the last 72 hours.  Invalid input(s): APPERANCEUR  Imaging: US Renal Transplant W/doppler  Result Date: 02/03/2018 CLINICAL DATA:  Acute renal failure. EXAM: ULTRASOUND OF RENAL TRANSPLANT WITH RENAL DOPPLER ULTRASOUND TECHNIQUE: Ultrasound examination of the renal transplant was performed with gray-scale, color and duplex doppler evaluation. Technologist describes technically difficult study with limited vascular evaluation attributed to body habitus and overlying bowel gas. COMPARISON:  None. FINDINGS: Transplant kidney location: Right lower quadrant Transplant Kidney: Length: 11 cm. Normal in size and parenchymal echogenicity. No evidence of mass or hydronephrosis. No peri-transplant fluid collection seen. Color flow in the main renal artery:  Limited evaluation Color flow in the main renal vein:  Limited evaluation Duplex Doppler Evaluation: Main Renal Artery Resistive Index: Not obtained Venous waveform in main renal vein:  Not demonstrated Intrarenal resistive index in upper pole:  Not obtained (normal 0.6-0.8; equivocal 0.8-0.9;  abnormal >= 0.9) Intrarenal resistive index in lower pole: Not obtained (normal 0.6-0.8; equivocal 0.8-0.9; abnormal >= 0.9) Bladder: Normal for degree of bladder distention. Unilateral ureteral jet documented. Other findings: There is a 8.9 x 5.5 x 6.3 cm deep subcutaneous fluid collection superior and lateral to the transplant kidney without significant mass effect upon the kidney or collecting system. IMPRESSION: 1. No hydronephrosis of transplant kidney. Limited vascular evaluation. 2. 8.9 cm deep subcutaneous fluid collection superior and lateral to the transplant kidney without significant mass effect on the kidney. Electronically Signed   By: Lucrezia Europe M.D.   On: 02/03/2018 21:52   Dg Chest Portable 1 View  Result Date: 02/03/2018 CLINICAL DATA:  Chest pain. Shortness of breath. Generalized weakness. EXAM: PORTABLE CHEST 1 VIEW COMPARISON:  Two-view chest x-ray 11/03/2017. FINDINGS: Heart is enlarged. Defibrillator pad is in place. Lung volumes are low. Mild pulmonary vascular congestion is present. No focal airspace disease is evident. IMPRESSION: 1. Cardiomegaly and mild pulmonary vascular congestion without frank edema. Early congestive heart failure is not excluded. 2. No focal airspace disease. Electronically Signed   By: San Morelle M.D.   On: 02/03/2018 16:16     Medications:   . sodium chloride    . heparin 1,100 Units/hr (02/04/18 0247)  . magnesium sulfate 1 - 4 g bolus IVPB Stopped (02/03/18 1500)   . feeding supplement (NEPRO CARB STEADY)  237 mL Oral BID BM  . mometasone-formoterol  2 puff Inhalation BID  . predniSONE  5 mg Oral Q breakfast  . propafenone  150 mg Oral Q8H  . sertraline  50 mg Oral Daily  . sodium bicarbonate  650 mg Oral BID  . Tacrolimus  2 mg Oral Daily   acetaminophen **OR** acetaminophen, albuterol, bisacodyl, HYDROcodone-acetaminophen, ondansetron **OR** ondansetron (ZOFRAN) IV, senna-docusate  Assessment/ Plan:    1. Acute renal failure  with increase in creatinine  Urine studies are pending there is no hydronephrosis on the renal ultrasound although no urine output was recorded last night. There is a order for a foley catheter  BKV and CMV have also been ordered  Previous history of ARF  4/19   2. Hyperkalemia better 3. Polycythemia  Most likely obesity hypoventilation 4. Immunosuppression  Levels pending 5. Atrial fibrillation/flutter    Watchman 3/19    xeralto and propafanone  6. History of ICH ( 2010 while on coumadin)    LOS: 1 Kareema Keitt W @TODAY @11 :09 AM

## 2018-02-04 NOTE — NC FL2 (Signed)
[place] LEVEL OF CARE SCREENING TOOL     IDENTIFICATION  Patient Name: [name] Birthdate: June 26, 1946 Sex: female Admission Date (Current Location): 02/03/2018  [facility] and [place] Number:  Herbalist and Address:  [place]. [facility], [place] [address] [place], [place] [address]      Provider Number: [id number]  Attending Physician Name and Address:  Thurnell Lose, MD  Relative Name and Phone Number:  Naketa Daddario, spouse, [phone]    Current Level of Care: Hospital Recommended Level of Care: [place] Prior Approval Number:    Date Approved/Denied:   PASRR Number: [id number]  Discharge Plan: SNF    Current Diagnoses: Patient Active Problem List   Diagnosis Date Noted  . Hyperkalemia 02/03/2018  . Atrial fibrillation with RVR ([place]) 02/03/2018  . Abscess of right leg 12/14/2017  . Lower extremity edema 12/10/2017  . Acute kidney injury superimposed on chronic kidney disease ([place]) 11/17/2017  . Acute diastolic (congestive) heart failure ([place]) 11/17/2017  . UTI (urinary tract infection) 11/03/2017  . Diarrhea 11/03/2017  . Septic shock ([place]) 11/03/2017  . Chronic respiratory failure with hypoxia ([place]) 10/03/2017  . Dyspnea 07/26/2017  . Atrial flutter ([place])   . Peripheral vascular disease ([place])   . Oligouria   . Hypertension   . Fractures, stress   . Eczema   . Distal radius fracture, right   . Depression   . Constipation   . Arthritis   . Exacerbation of asthma 04/01/2017  . Closed fracture of right distal radius Oct 28, 2016  . Deceased-donor kidney transplant recipient 09/13/2015  . Nonischemic dilated cardiomyopathy ([place])   . Skin ulceration ([place]) 05/09/2013  . Abscess of shoulder 05/15/2012  . Expected blood loss anemia 03/26/2012  . S/P right TH revision 03/25/2012  . Abscess of hip, right 12/24/2011  . DVT of lower extremity, bilateral ([place]) 12/21/2011  . History of DVT (deep  vein thrombosis) 12/08/2011  . OSA on CPAP 12/08/2011  . End stage renal disease ([place]) 11/06/2011  . 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 Pseudomonas aeruginosa infection 10/24/2011  . ESRD (end stage renal disease) on dialysis ([place]) 09/14/2011  . Hypercalcemia   . Community acquired pneumonia of left upper lobe of lung ([place]) 09/10/2011  . Gout 09/10/2011  . Absence of right hip joint with antibiotic pacer 09/03/2011  . Traumatic seroma of thigh ([place]) 06/11/2011  . Clostridium difficile colitis 06/09/2011  . Leukocytosis 06/08/2011  . Anemia 06/06/2011  . ARF (acute renal failure) ([place]) 06/06/2011  . SINUSITIS- ACUTE-NOS 09/27/2010  . DEPRESSION 11/25/2008  . PAF (paroxysmal atrial fibrillation) ([place]) 11/25/2008  . RENAL FAILURE, ACUTE 11/25/2008  . CVA 09/28/2008  . 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 WEAKNESS, LEFT SIDE OF BODY 09/21/2008  . Morbid obesity ([place]) 10/20/2007  . Stroke due to intracerebral hemorrhage ([place]) 07/17/2007  . Stroke ([place]) 07/17/2007  . Essential hypertension 07/07/2007  . Asthma 07/07/2007    Orientation RESPIRATION BLADDER Height & Weight     Self, Time, Situation, Place  Normal Incontinent Weight: 221 lb 5.5 oz (100.4 kg) Height:  5\' 5"  (165.1 cm)  BEHAVIORAL SYMPTOMS/MOOD NEUROLOGICAL BOWEL NUTRITION STATUS      Continent Diet(please see DC summary)  AMBULATORY STATUS COMMUNICATION OF NEEDS Skin   Extensive Assist Verbally Normal                       Personal Care Assistance Level of Assistance  Bathing, Feeding, Dressing Bathing Assistance: Limited assistance(mod assist) Feeding assistance: Independent Dressing Assistance: Limited assistance(mod assist)     Functional Limitations Info  Sight, Hearing, Speech Sight Info: Adequate Hearing Info: Adequate Speech Info: Adequate    SPECIAL CARE FACTORS FREQUENCY  PT (By licensed PT), OT (By licensed OT)     PT Frequency: 5x/week OT Frequency:  5x/week            Contractures Contractures Info: Not present    Additional Factors Info  Code Status, Allergies, Psychotropic Code Status Info: Full Allergies Info: Ace Inhibitors, Amiodarone, Warfarin Sodium Psychotropic Info: zoloft         Current Medications (02/04/2018):  This is the current hospital active medication list Current Facility-Administered Medications  Medication Dose Route Frequency Provider Last Rate Last Dose  . 0.9 %  sodium chloride infusion   Intravenous Continuous Thurnell Lose, MD 75 mL/hr at 02/04/18 1110    . acetaminophen (TYLENOL) tablet 650 mg  650 mg Oral Q6H PRN Rondel Jumbo, PA-C       Or  . acetaminophen (TYLENOL) suppository 650 mg  650 mg Rectal Q6H PRN Rondel Jumbo, PA-C      . albuterol (PROVENTIL) (2.5 MG/3ML) 0.083% nebulizer solution 2.5 mg  2.5 mg Inhalation Q6H PRN Rondel Jumbo, PA-C      . bisacodyl (DULCOLAX) suppository 10 mg  10 mg Rectal Daily PRN Rondel Jumbo, PA-C      . feeding supplement (NEPRO CARB STEADY) liquid 237 mL  237 mL Oral BID BM Donne Hazel, MD      . heparin ADULT infusion 100 units/mL (25000 units/284mL sodium chloride 0.45%)  1,100 Units/hr Intravenous Continuous Donne Hazel, MD 11 mL/hr at 02/04/18 0247 1,100 Units/hr at 02/04/18 0247  . HYDROcodone-acetaminophen (NORCO/VICODIN) 5-325 MG per tablet 1-2 tablet  1-2 tablet Oral Q4H PRN Sharene Butters E, PA-C      . magnesium sulfate IVPB 2 g 50 mL  2 g Intravenous Once Duffy [name], MD   Stopped at 02/03/18 1500  . mometasone-formoterol (DULERA) 200-5 MCG/ACT inhaler 2 puff  2 puff Inhalation BID Rondel Jumbo, PA-C   2 puff at 02/04/18 0835  . ondansetron (ZOFRAN) tablet 4 mg  4 mg Oral Q6H PRN Rondel Jumbo, PA-C   4 mg at 02/04/18 1105   Or  . ondansetron (ZOFRAN) injection 4 mg  4 mg Intravenous Q6H PRN Sharene Butters E, PA-C   4 mg at 02/04/18 0228  . predniSONE (DELTASONE) tablet 5 mg  5 mg Oral Q breakfast Rondel Jumbo, PA-C    5 mg at 02/04/18 0854  . propafenone (RYTHMOL) tablet 150 mg  150 mg Oral Q8H Wertman, Sara E, PA-C   150 mg at 02/04/18 1106  . senna-docusate (Senokot-S) tablet 1 tablet  1 tablet Oral QHS PRN Rondel Jumbo, PA-C      . sertraline (ZOLOFT) tablet 50 mg  50 mg Oral Daily Rondel Jumbo, PA-C   50 mg at 02/04/18 0854  . sodium bicarbonate tablet 650 mg  650 mg Oral BID Edrick Oh, MD   650 mg at 02/04/18 0854  . Tacrolimus TB24 2 mg per 2 tablets  2 mg Oral Daily Thurnell Lose, MD   2 mg at 02/04/18 1105     Discharge Medications: Please see discharge summary for a list of discharge medications.  Relevant Imaging Results:  Relevant Lab Results:   Additional Information SSN: [id number]  Manuela Schwartz  Starleen Blue, LCSW

## 2018-02-04 NOTE — Progress Notes (Signed)
Lab tech unable to use feet for lab work, Rt. Is swollen and Lt. unable to see anything.  Text MD on call to see what can be done.

## 2018-02-04 NOTE — Progress Notes (Addendum)
Progress Note  Patient Name: [name] Date of Encounter: 02/04/2018  Primary Cardiologist: Dr. Radford Pax >> [facility]ene Mink [facility]  Subjective   Still pretty nauseous, didn't sleep much last night.  No CP or SOB, no palpitations  Inpatient Medications    Scheduled Meds: . aspirin EC  81 mg Oral Daily  . feeding supplement (NEPRO CARB STEADY)  237 mL Oral BID BM  . mometasone-formoterol  2 puff Inhalation BID  . predniSONE  5 mg Oral Q breakfast  . propafenone  150 mg Oral Q8H  . sertraline  50 mg Oral Daily  . sodium bicarbonate  650 mg Oral BID  . Tacrolimus  1 tablet Oral Daily   Continuous Infusions: . sodium chloride 75 mL/hr at 02/04/18 0247  . heparin 1,100 Units/hr (02/04/18 0247)  . magnesium sulfate 1 - 4 g bolus IVPB Stopped (02/03/18 1500)   PRN Meds: acetaminophen **OR** acetaminophen, albuterol, bisacodyl, HYDROcodone-acetaminophen, ondansetron **OR** ondansetron (ZOFRAN) IV, senna-docusate   Vital Signs    Vitals:   02/03/18 1715 02/03/18 1859 02/04/18 0017 02/04/18 0353  BP: (!) 162/74 (!) 157/84 (!) 153/75   Pulse: 72  76 78  Resp: (!) 22 (!) 23 (!) 23   Temp:  (!) 97.5 F (36.4 C) (!) 97.4 F (36.3 C) 98.3 F (36.8 C)  TempSrc:  Oral Oral Oral  SpO2: 100% 97% 92% 91%  Weight:    221 lb 5.5 oz (100.4 kg)  Height:        Intake/Output Summary (Last 24 hours) at 02/04/2018 0701 Last data filed at 02/04/2018 0247 Gross per 24 hour  Intake 1991.11 ml  Output -  Net 1991.11 ml   Filed Weights   02/03/18 1500 02/04/18 0353  Weight: 221 lb 5.1 oz (100.4 kg) 221 lb 5.5 oz (100.4 kg)    Telemetry    SR, oc PACs, generally 80's - Personally Reviewed  ECG    SR, IVCD - Personally Reviewed  Physical Exam   GEN: No acute distress.   Neck: No JVD Cardiac: RRR, 1/6 SM, rubs, or gallops.  Respiratory: CTA b/l. GI: Soft, nontender, non-distended  MS: No edema; No deformity. Neuro:  Nonfocal  Psych: Normal affect   Labs    Chemistry Recent  Labs  Lab 02/03/18 1425 02/03/18 1447 02/03/18 1458  NA 129* 135 132*  K 8.3* 5.6* 5.4*  CL 99 92* 97*  CO2  --  18*  --   GLUCOSE 36* 37* 37*  BUN 64* 39* 42*  CREATININE 5.00* 5.00* 5.00*  CALCIUM  --  10.8*  --   PROT  --  7.1  --   ALBUMIN  --  4.1  --   AST  --  76*  --   ALT  --  57*  --   ALKPHOS  --  74  --   BILITOT  --  3.4*  --   GFRNONAA  --  8*  --   GFRAA  --  9*  --   ANIONGAP  --  25*  --      Hematology Recent Labs  Lab 02/03/18 1400 02/03/18 1425 02/03/18 1458  WBC 13.0*  --   --   RBC 5.50*  --   --   HGB 14.4 18.7* 18.4*  HCT 47.9* 55.0* 54.0*  MCV 87.1  --   --   MCH 26.2  --   --   MCHC 30.1  --   --   RDW 21.4*  --   --  PLT 258  --   --     Cardiac EnzymesNo results for input(s): TROPONINI in the last 168 hours.  Recent Labs  Lab 02/03/18 1424  TROPIPOC 0.36*     BNPNo results for input(s): BNP, PROBNP in the last 168 hours.   DDimer No results for input(s): DDIMER in the last 168 hours.   Radiology    US Renal Transplant W/doppler Result Date: 02/03/2018 CLINICAL DATA:  Acute renal failure. EXAM: ULTRASOUND OF RENAL TRANSPLANT WITH RENAL DOPPLER ULTRASOUND TECHNIQUE: Ultrasound examination of the renal transplant was performed with gray-scale, color and duplex doppler evaluation. Technologist describes technically difficult study with limited vascular evaluation attributed to body habitus and overlying bowel gas. COMPARISON:  None. FINDINGS: Transplant kidney location: Right lower quadrant Transplant Kidney: Length: 11 cm. Normal in size and parenchymal echogenicity. No evidence of mass or hydronephrosis. No peri-transplant fluid collection seen. Color flow in the main renal artery:  Limited evaluation Color flow in the main renal vein:  Limited evaluation Duplex Doppler Evaluation: Main Renal Artery Resistive Index: Not obtained Venous waveform in main renal vein:  Not demonstrated Intrarenal resistive index in upper pole:  Not  obtained (normal 0.6-0.8; equivocal 0.8-0.9; abnormal >= 0.9) Intrarenal resistive index in lower pole: Not obtained (normal 0.6-0.8; equivocal 0.8-0.9; abnormal >= 0.9) Bladder: Normal for degree of bladder distention. Unilateral ureteral jet documented. Other findings: There is a 8.9 x 5.5 x 6.3 cm deep subcutaneous fluid collection superior and lateral to the transplant kidney without significant mass effect upon the kidney or collecting system. IMPRESSION: 1. No hydronephrosis of transplant kidney. Limited vascular evaluation. 2. 8.9 cm deep subcutaneous fluid collection superior and lateral to the transplant kidney without significant mass effect on the kidney. Electronically Signed   By: Lucrezia Europe M.D.   On: 02/03/2018 21:52    Dg Chest Portable 1 View Result Date: 02/03/2018 CLINICAL DATA:  Chest pain. Shortness of breath. Generalized weakness. EXAM: PORTABLE CHEST 1 VIEW COMPARISON:  Two-view chest x-ray 11/03/2017. FINDINGS: Heart is enlarged. Defibrillator pad is in place. Lung volumes are low. Mild pulmonary vascular congestion is present. No focal airspace disease is evident. IMPRESSION: 1. Cardiomegaly and mild pulmonary vascular congestion without frank edema. Early congestive heart failure is not excluded. 2. No focal airspace disease. Electronically Signed   By: San Morelle M.D.   On: 02/03/2018 16:16    Cardiac Studies   11/04/17: TTE [facility]) SUMMARY The left ventricular size is normal. Left ventricular systolic function is low normal. LV ejection fraction = 50-55% Left ventricular filling pattern is indeterminate. The right ventricle is borderline dilated with borderline reduced function. The left atrium is mildly dilated. The right atrium is mildly dilated. There is mild mitral regurgitation. There is severe tricuspid regurgitation. IVC size was moderately dilated. There is no pericardial effusion. Probably no significant change in comparison with the prior study  noted,  including in RV size/function.  08/05/17: TTE Study Conclusions - Left ventricle: The cavity size was mildly dilated. Wall   thickness was normal. Systolic function was severely reduced. The   estimated ejection fraction was in the range of 25% to 30%. - Left atrium: The atrium was mildly dilated. - Right ventricle: Systolic function was moderately to severely   reduced. - Right atrium: The atrium was mildly dilated.  Patient Profile     72 y.o. female with extensive PMHx, ESRF hx of HD >> renal transplant, chronic immunosuppression on anti-rejection meds, HTN, hx of DVT w/IVC filter, permanent Afib/flutter  historically has had ablation, most recently this year a watchman, OSA, cerebral bleed in in 2012 (on warfarin), CVA, DCM, and remote gastric bypass, admitted with worsening anorexia, poor PO intake, persistent nausea, loose stools, some subjective chills, no documented fever, in the ER arrived in [facility] of unclear etiology, noted to have hyperkalemia, low glucose, acute renal failure. She had no cardiac awareness, denies any CP or SOB, no palpitations  She was urgently cardioverted in the ER and felt to have been 2:1 Aflutter  LABS today are pending  Cardiac/EP follows with Dr. Gelene Mink [facility]) Hx of amiodarone stopped with pulmonary concerns and sotalol, comes in on propafenone Remained on xarelto/ASA, post watchman until September per patient reports  Assessment & Plan    1. WCT (hemodynamically stable with minimal cardiac symptoms if any     AFlutter, hx of permanent AFib     Remote ablations     March this year, Watchman     On rythmol  Post ER cardioversion in SR Compliant with xarelto, though given acute kidney failure, held, pharmacy for heparin until [facility] can be resumed  Dr. Caryl Comes reached out to her [facility] cardiologist yesterday.  Dr. Caryl Comes will see her later this AM  Continue with medicine service/nephrology: 2. AKI     Hx of renal transplant 3.  Hyperkalemia 4. Hypoglycemia 5. HTN   For questions or updates, please contact [place] Please consult [email or web address] for contact info under Cardiology/STEMI.      Signed, Baldwin Jamaica, PA-C  02/04/2018, 7:01 AM    Seen and examined   Spoke with Dr Gelene Mink at [facility] re atrial arrhythmia  He will see her to consider catheter ablation of her atrial flutter and fib when renal function stablizes   Will need hep following urgent cardioversion for atrial flutter

## 2018-02-04 NOTE — Evaluation (Addendum)
Physical Therapy Evaluation Patient Details Name: [name] MRN: [id number] DOB: 06/29/1946 Today's Date: 02/04/2018   History of Present Illness  Pt is a 72 y.o. female admitted 02/03/18 with generalized weakness, found to have acute renal failure and a-fib; was cardioverted in the ER. PMH includes h/o renal transplant (2017), ESRD, CKDIII, DVT s/p IVC filter placement, CVA, HTN, chronic immunosuppression, a-fib s/p watchman device placement.    Clinical Impression  Pt presents with an overall decrease in functional mobility secondary to above. PTA, pt lives at home with husband, ambulatory with [facility], and working with outpatient PT services. Today, pt required modA for bed mobility; once sitting EOB, further mobility limited by c/o fatigue and stomach pain. Pending pt progression with OOB mobility, may require short-term SNF-level therapies to maximize functional mobility and independence prior to return home; pt acknowledges this, but would prefer to return home with continued OP PT services (agreeable to HHPT/OT services if needed). Will follow acutely to address established goals and assist with d/c needs.    Follow Up Recommendations SNF;Supervision for mobility/OOB (pending progression)    Equipment Recommendations  None recommended by PT    Recommendations for Other Services       Precautions / Restrictions Precautions Precautions: Fall Restrictions Weight Bearing Restrictions: No      Mobility  Bed Mobility Overal bed mobility: Needs Assistance Bed Mobility: Supine to Sit;Sit to Supine     Supine to sit: Mod assist Sit to supine: Mod assist   General bed mobility comments: ModA to assist RLE and hips to EOB; increased time and effort secondary to pain and fatigue  Transfers                 General transfer comment: Pt declining secondary to c/o stomach pain with mobility  Ambulation/Gait                Stairs            Wheelchair Mobility    Modified Rankin (Stroke Patients Only)       Balance Overall balance assessment: Needs assistance   Sitting balance-Leahy Scale: Fair Sitting balance - Comments: Unable to reach feet, pt reports due to stomach pain                                     Pertinent Vitals/Pain Pain Assessment: Faces Faces Pain Scale: Hurts even more Pain Location: Stomach Pain Descriptors / Indicators: Grimacing;Guarding Pain Intervention(s): Monitored during session;Limited activity within patient's tolerance    Home Living Family/patient expects to be discharged to:: Private residence Living Arrangements: Spouse/significant other Available Help at Discharge: Family;Available 24 hours/day Type of Home: House Home Access: Stairs to enter Entrance Stairs-Rails: Psychiatric nurse of Steps: 4 Home Layout: One level Home Equipment: Walker - 2 wheels;Walker - 4 wheels;Cane - single point;Transport chair;Wheelchair - Brewing technologist;Tub bench      Prior Function Level of Independence: Needs assistance   Gait / Transfers Assistance Needed: Reports mod indep household distances with SPC. Requires assist from husband to get up steps into home. Does not drive           Hand Dominance        Extremity/Trunk Assessment   Upper Extremity Assessment Upper Extremity Assessment: Defer to OT evaluation    Lower Extremity Assessment Lower Extremity Assessment: Generalized weakness       Communication   Communication: No difficulties  Cognition Arousal/Alertness: Awake/alert Behavior During Therapy: WFL for tasks assessed/performed Overall Cognitive Status: Within Functional Limits for tasks assessed                                        General Comments General comments (skin integrity, edema, etc.): Supine BP 139/71, seated BP 162/70 (of note, BP cuff on RLE)    Exercises     Assessment/Plan    PT Assessment Patient needs  continued PT services  PT Problem List Decreased strength;Decreased activity tolerance;Decreased balance;Decreased mobility;Decreased knowledge of use of DME       PT Treatment Interventions DME instruction;Gait training;Stair training;Functional mobility training;Therapeutic activities;Therapeutic exercise;Balance training;Patient/family education    PT Goals (Current goals can be found in the Care Plan section)  Acute Rehab PT Goals Patient Stated Goal: "I'd prefer to go back home and keep working with OP PT" PT Goal Formulation: With patient Time For Goal Achievement: 02/18/18 Potential to Achieve Goals: Fair    Frequency Min 3X/week   Barriers to discharge        Co-evaluation PT/OT/SLP Co-Evaluation/Treatment: Yes Reason for Co-Treatment: For patient/therapist safety;To address functional/ADL transfers;Other (comment)(Fatigue/pain levels) PT goals addressed during session: Mobility/safety with mobility         AM-PAC PT "6 Clicks" Daily Activity  Outcome Measure Difficulty turning over in bed (including adjusting bedclothes, sheets and blankets)?: Unable Difficulty moving from lying on back to sitting on the side of the bed? : Unable Difficulty sitting down on and standing up from a chair with arms (e.g., wheelchair, bedside commode, etc,.)?: Unable Help needed moving to and from a bed to chair (including a wheelchair)?: A Little Help needed walking in hospital room?: A Lot Help needed climbing 3-5 steps with a railing? : A Lot 6 Click Score: 10    End of Session   Activity Tolerance: Patient limited by fatigue Patient left: in bed;with call bell/phone within reach;with family/visitor present Nurse Communication: Mobility status PT Visit Diagnosis: Other abnormalities of gait and mobility (R26.89);Muscle weakness (generalized) (M62.81)    Time: 9678-9381 PT Time Calculation (min) (ACUTE ONLY): 17 min   Charges:   PT Evaluation $PT Eval Moderate Complexity: 1  Mod     PT G Codes:       Mabeline Caras, PT, DPT Acute Rehab Services  Pager: [place] 02/04/2018, 11:38 AM

## 2018-02-04 NOTE — Progress Notes (Signed)
Patient refused CPAP for tonight. RT informed patient to have RT called if she changes her mind. RT will monitor as needed. 

## 2018-02-04 NOTE — Progress Notes (Signed)
Dr. Baltazar Najjar on call place a midline ordered for IV team.  IV team called me, they stated they cannot do it that Nephrology needs to order the midline.   Paged Nephrology, Dr. Jimmy Footman stated that this will not be possible to have lab have another person try again.  I let him know that lab tried twice and checked her sites, and nothing was seen that can help with blood drawn.  Dr. Jimmy Footman stated that this would be address in the morning.

## 2018-02-04 NOTE — Progress Notes (Signed)
ANTICOAGULATION CONSULT NOTE - Initial Consult  Pharmacy Consult for Heparin Indication: atrial fibrillation  Allergies  Allergen Reactions  . Ace Inhibitors Other (See Comments)    Reaction unknown  . 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 Warfarin Sodium Other (See Comments)    Caused her to have a stroke    Patient Measurements: Ht: 5'5" Wt: 100.4 Heparin Dosing Weight: 80 kg  Vital Signs: Temp: 98.3 F (36.8 C) (07/23 0353) Temp Source: Oral (07/23 0353) Pulse Rate: 78 (07/23 0353)  Labs: Recent Labs    02/03/18 1400  02/03/18 1425 02/03/18 1447 02/03/18 1458 02/04/18 0600 02/04/18 0611 02/04/18 1047  HGB 14.4  --  18.7*  18.7*  --  18.4*  --  12.3  --   HCT 47.9*  --  55.0*  55.0*  --  54.0*  --  39.3  --   PLT 258  --   --   --   --   --  199  --   APTT  --   --   --   --   --  >200*  --  >200*  HEPARINUNFRC  --   --   --   --   --  >2.20*  --   --   CREATININE  --    < > 5.00*  5.00* 5.00* 5.00*  --  4.71*  4.65*  --   TROPONINI  --   --   --   --   --   --  0.38*  --    < > = values in this interval not displayed.    Estimated Creatinine Clearance: 13.4 mL/min (A) (by C-G formula based on SCr of 4.65 mg/dL (H)).   Medical History: Past Medical History:  Diagnosis Date  . Acute diastolic (congestive) heart failure ([place]) 08/12/2017  . AKI (acute kidney injury) ([place]) 11/04/2017  . Anemia   . Arthritis    HIP  . Asthma    as a child  . Atrial flutter [facility])    s/p aflutter ablation at [facility]  . Blood transfusion    never had a reaction to blood transfusion  . Cardiomyopathy Mar 2013   Mild, EF 50-55% by Mar 2013 ECHO, diast dysfxn II  . Closed fracture of right distal radius 10/18/2016  . CMV (cytomegalovirus infection) ([place]) 10/03/2015  . Constipation    takes Miralax daily  . Depression    takes Zoloft daily  . Distal radius fracture, right   . DVT of lower extremity,  bilateral ([place]) 12/21/11   "they're there now; been there for 2 wks"  . Eczema   . ESRD (end stage renal disease) on dialysis [facility]) 09/2011   07-19-2015 had Kidney transplant at [facility]  . Fractures, stress    in both feet--6 OR 7 YRS AGO--RESOLVED  . Generalized edema [id number]  . Gout    doesn't require meds   . HCAP (healthcare-associated pneumonia)    09/10/11  . Hearing difficulty   . Hypercalcemia    09/10/11  . Hyperparathyroidism ([place]) 04/07/2013  . Hypertension    takes Diltiazem daily   . Hypomagnesemia 08/02/2015  . Hypophosphatemia 11/08/2017  . Immunosuppression ([place]) 07/25/2015  . Memory changes   . Morbid obesity ([place])   . Nonischemic dilated cardiomyopathy ([place])   . Obesity 04/07/2013  . Oligouria   . PAF (paroxysmal atrial fibrillation) ([facility])     HX OF  CEREBRAL BLEED WHILE ON COUMADIN-SO PT NOT ON ANY BLOOD THINNERS NOW  . Peripheral vascular disease ([place])   . Presence of Watchman left atrial appendage closure device 10/04/2017  . Proteinuria 09/04/2016  . Psoriasis 08/07/2016  . 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 Sepsis ([place]) 11/08/2017  . Sleep apnea    uses CPAP nightly  . Stroke [facility]) 2009   denies residual, hemorrhagic now off coumadin  . Stroke due to intracerebral hemorrhage ([place]) 2009  . Suture reaction 09/04/2016    Medications:  Scheduled:  . feeding supplement (NEPRO CARB STEADY)  237 mL Oral BID BM  . mometasone-formoterol  2 puff Inhalation BID  . predniSONE  5 mg Oral Q breakfast  . propafenone  150 mg Oral Q8H  . sertraline  50 mg Oral Daily  . sodium bicarbonate  650 mg Oral BID  . Tacrolimus  2 mg Oral Daily    Assessment: 36 yof presenting for evaluation of WCT today. Has had recurrent atrial fibrillation/flutter with multiple agents tried - currently on propafenone. On Xarelto PTA for Afib - last dose on 7/21 in PM.   Hgb 18.4, plt 258. No s/sx of bleeding. Scr elevated highly at 5 (baseline 1.7), with hx of renal transplant in  2017. Given recent Xarelto dose, will monitor both aPTT and heparin level until correlate.   APTT >200 with morning labs. Due to line placement and lab drawl a repeat aPTT was ordered and results >200 again.  The patient has bruising around lab drawl site, but no other signs of bleeding. Drop in hemoglobin likely from original CBC being hemoconcentrated. Will continue to monitor for signs/symptoms of bleeding.   Goal of Therapy:  Heparin level 0.3-0.7 units/ml aPTT 66-102 seconds Monitor platelets by anticoagulation protocol: Yes   Plan:  Hold heparin for 1 hour  Decrease heparin to 800 units/hr and restart at 1400 Check anti-Xa and aPTT level in 8 hours and daily while on heparin Continue to monitor H&H and platelets  Isaias Sakai, Sherian Rein D PGY1 Pharmacy Resident  Phone [phone] 02/04/2018      1:32 PM

## 2018-02-04 NOTE — Evaluation (Signed)
Occupational Therapy Evaluation Patient Details Name: [name] MRN: [id number] DOB: May 12, 1946 Today's Date: 02/04/2018    History of Present Illness Pt is a 72 y.o. female admitted 02/03/18 with generalized weakness, found to have acute renal failure and a-fib; was cardioverted in the ER. PMH includes h/o renal transplant (2017), ESRD, CKDIII, DVT s/p IVC filter placement, CVA, HTN, chronic immunosuppression, a-fib s/p watchman device placement.   Clinical Impression   PTA, pt was independent with ADL and functional mobility. She reports severe stomach pain and presents with generalized weakness this date impacting her ability to participate in ADL and functional mobility OOB today. She requires total assistance for LB ADL at this time as a result of this pain and was unable to access her feet. Pt able to sit at EOB for ADL participation but declined functional mobility this session. Pt would benefit from continued OT services while admitted to improve independence and safety with ADL and functional mobility. Recommend SNF level rehabilitation at this time. OT will continue to follow while admitted.     Follow Up Recommendations  SNF;Supervision/Assistance - 24 hour    Equipment Recommendations  None recommended by OT    Recommendations for Other Services       Precautions / Restrictions Precautions Precautions: Fall Restrictions Weight Bearing Restrictions: No      Mobility Bed Mobility Overal bed mobility: Needs Assistance Bed Mobility: Supine to Sit;Sit to Supine     Supine to sit: Mod assist Sit to supine: Mod assist   General bed mobility comments: ModA to assist RLE and hips to EOB; increased time and effort secondary to pain and fatigue  Transfers                 General transfer comment: Pt declining secondary to c/o stomach pain with mobility    Balance Overall balance assessment: Needs assistance Sitting-balance support: No upper extremity  supported;Feet supported Sitting balance-Leahy Scale: Fair Sitting balance - Comments: Unable to reach feet, pt reports due to stomach pain                                   ADL either performed or assessed with clinical judgement   ADL Overall ADL's : Needs assistance/impaired Eating/Feeding: Set up;Sitting   Grooming: Sitting;Supervision/safety   Upper Body Bathing: Sitting;Supervision/ safety   Lower Body Bathing: Total assistance;Sitting/lateral leans   Upper Body Dressing : Supervision/safety;Sitting   Lower Body Dressing: Total assistance;Sitting/lateral leans                 General ADL Comments: Pt agreeable only to participate at EOB for ADL. Unable to access feet due to stomach pain     Vision Patient Visual Report: No change from baseline Vision Assessment?: No apparent visual deficits     Perception     Praxis      Pertinent Vitals/Pain Pain Assessment: Faces Faces Pain Scale: Hurts even more Pain Location: Stomach Pain Descriptors / Indicators: Grimacing;Guarding Pain Intervention(s): Monitored during session;Limited activity within patient's tolerance     Hand Dominance     Extremity/Trunk Assessment Upper Extremity Assessment Upper Extremity Assessment: Generalized weakness   Lower Extremity Assessment Lower Extremity Assessment: Generalized weakness       Communication Communication Communication: No difficulties   Cognition Arousal/Alertness: Awake/alert Behavior During Therapy: WFL for tasks assessed/performed Overall Cognitive Status: Within Functional Limits for tasks assessed  General Comments  Supine BO 139/71, seated BP 162/70 (of note BP cuff on RLE)    Exercises     Shoulder Instructions      Home Living Family/patient expects to be discharged to:: Private residence Living Arrangements: Spouse/significant other Available Help at Discharge:  Family;Available 24 hours/day Type of Home: House Home Access: Stairs to enter [employer] of Steps: 4 Entrance Stairs-Rails: Right;Left Home Layout: One level     Bathroom Shower/Tub: Teacher, early years/pre: Standard     Home Equipment: Environmental consultant - 2 wheels;Walker - 4 wheels;Cane - single point;Transport chair;Wheelchair - Brewing technologist;Tub bench          Prior Functioning/Environment Level of Independence: Needs assistance  Gait / Transfers Assistance Needed: Reports mod indep household distances with SPC. Requires assist from husband to get up steps into home. Does not drive ADL's / Homemaking Assistance Needed: Reports independence with basic ADL.             OT Problem List: Decreased strength;Decreased range of motion;Decreased activity tolerance;Impaired balance (sitting and/or standing);Decreased safety awareness;Decreased knowledge of use of DME or AE;Decreased knowledge of precautions;Pain      OT Treatment/Interventions: Self-care/ADL training;Therapeutic exercise;Energy conservation;DME and/or AE instruction;Therapeutic activities;Patient/family education;Balance training    OT Goals(Current goals can be found in the care plan section) Acute Rehab OT Goals Patient Stated Goal: "I'd prefer to go back home and keep working with OP PT" OT Goal Formulation: With patient Time For Goal Achievement: 02/18/18 Potential to Achieve Goals: Good ADL Goals Pt Will Perform Grooming: with modified independence;standing Pt Will Perform Lower Body Dressing: with modified independence;sit to/from stand Pt Will Transfer to Toilet: with modified independence;ambulating;regular height toilet Pt Will Perform Toileting - Clothing Manipulation and hygiene: with modified independence;sit to/from stand Pt Will Perform Tub/Shower Transfer: Tub transfer;tub bench;with modified independence;ambulating;rolling walker  OT Frequency: Min 2X/week   Barriers to D/C:             Co-evaluation PT/OT/SLP Co-Evaluation/Treatment: Yes Reason for Co-Treatment: For patient/therapist safety(fatigue/pain levels) PT goals addressed during session: Mobility/safety with mobility OT goals addressed during session: ADL's and self-care;Strengthening/ROM      AM-PAC PT "6 Clicks" Daily Activity     Outcome Measure Help from another person eating meals?: None Help from another person taking care of personal grooming?: None Help from another person toileting, which includes using toliet, bedpan, or urinal?: Total Help from another person bathing (including washing, rinsing, drying)?: A Lot Help from another person to put on and taking off regular upper body clothing?: A Little Help from another person to put on and taking off regular lower body clothing?: Total 6 Click Score: 15   End of Session Nurse Communication: Mobility status  Activity Tolerance: Patient tolerated treatment well Patient left: in bed;with call bell/phone within reach  OT Visit Diagnosis: Other abnormalities of gait and mobility (R26.89);Muscle weakness (generalized) (M62.81)                Time: 1010-1033 OT Time Calculation (min): 23 min Charges:  OT General Charges $OT Visit: 1 Visit OT Evaluation $OT Eval Moderate Complexity: 1 Mod G-Codes:     Norman Herrlich, MS OTR/L  Pager: [name] 02/04/2018, 1:13 PM

## 2018-02-04 NOTE — Progress Notes (Signed)
  Echocardiogram 2D Echocardiogram has been performed.  [name] 02/04/2018, 11:51 AM

## 2018-02-04 NOTE — Clinical Social Work Note (Signed)
Clinical Social Work Assessment  Patient Details  Name: [name] MRN: [id number] Date of Birth: [birth year]  Date of referral:  02/04/18               Reason for consult:  Facility Placement, Discharge Planning                Permission sought to share information with:  Facility Sport and exercise psychologist, Family Supports Permission granted to share information::  Yes, Verbal Permission Granted  Name::     Kaiesha Tonner  Agency::  SNFs  Relationship::  spouse  Contact Information:  [phone]  Housing/Transportation Living arrangements for the past 2 months:  Single Family Home Source of Information:  Patient, Adult Children, Spouse Patient Interpreter Needed:  None Criminal Activity/Legal Involvement Pertinent to Current Situation/Hospitalization:  No - Comment as needed Significant Relationships:  Adult Children, Spouse Lives with:  Spouse Do you feel safe going back to the place where you live?  Yes Need for family participation in patient care:  Yes (Comment)  Care giving concerns: Patient from home with spouse. PT recommending SNF.   Social Worker assessment / plan: CSW met with patient, spouse, and daughter at bedside. Patient alert and oriented. CSW introduced self and role and discussed disposition planning - PT recommendation for SNF. Patient and family reported that patient has been receiving home health PT. Patient and family agreeable to SNF after this admission. Patient has been at [facility] in the past and they would prefer for patient to go there again. CSW sent out initial referrals and awaiting bed offers. Will provide offers when available and support with discharge planning.  Employment status:  Retired Forensic scientist:  Medicare PT Recommendations:  [place] / Referral to community resources:  [place]  Patient/Family's Response to care: Patient and family appreciative of care.  Patient/Family's  Understanding of and Emotional Response to Diagnosis, Current Treatment, and Prognosis: Patient and family with understanding of patient's condition and agreeable to SNF.  Emotional Assessment Appearance:  Appears stated age Attitude/Demeanor/Rapport:  Engaged Affect (typically observed):  Accepting, Calm, Appropriate, Pleasant Orientation:  Oriented to Self, Oriented to Place, Oriented to  Time, Oriented to Situation Alcohol / Substance use:  Not Applicable Psych involvement (Current and /or in the community):  No (Comment)  Discharge Needs  Concerns to be addressed:  Discharge Planning Concerns, Care Coordination Readmission within the last 30 days:  No Current discharge risk:  Physical Impairment Barriers to Discharge:  Continued Medical Work up   Estanislado Emms, LCSW 02/04/2018, 12:41 PM

## 2018-02-04 NOTE — Discharge Summary (Signed)
Patient Demographics  [name]   VEL:[id number]  CHE:[id number]  is a 72 y.o. female  DOB - 01-23-1946  02/03/2018  Transfer Date 02/04/2018  Admitting Physician Donne Hazel, MD  Outpatient Primary MD for the patient is Carollee Herter, Alferd Apa, DO    Place of Transfer [facility] Accepting MD - Nephrology Fellow Dr. Berline Lopes [facility] Condition Stable  Admission Diagnosis  Acute renal failure (ARF) [facility]) [N17.9]  Discharge Diagnosis    ARF  Past Medical History:  Diagnosis Date  . Acute diastolic (congestive) heart failure ([place]) 08/12/2017  . AKI (acute kidney injury) ([place]) 11/04/2017  . Anemia   . Anxiety   . Arthritis    "shoulders; arms; hips" (02/04/2018)  . Atrial flutter [facility])    s/p aflutter ablation at [facility]  . Blood transfusion    never had a reaction to blood transfusion  . Cardiomyopathy Mar 2013   Mild, EF 50-55% by Mar 2013 ECHO, diast dysfxn II  . CHF (congestive heart failure) ([place]) 2005  . Childhood asthma   . Closed fracture of right distal radius 10/18/2016  . CMV (cytomegalovirus infection) ([place]) 10/03/2015  . Constipation    takes Miralax daily  . Depression    takes Zoloft daily  . Distal radius fracture, right   . DVT of lower extremity, bilateral ([place]) 12/21/11   "they're there now; been there for 2 wks"  . Eczema   . ESRD (end stage renal disease) on dialysis [facility]) 09/2011   07-19-2015 had Kidney transplant at [facility]; "don't get dialysis anymore" (02/04/2018)  . Fractures, stress    in both feet--6 OR 7 YRS AGO--RESOLVED  . Generalized edema [id number]  . Gout    doesn't require meds   . HCAP (healthcare-associated pneumonia) 09/10/2011  . Hearing difficulty   . High cholesterol   . Hypercalcemia    09/10/11  . Hyperparathyroidism ([place]) 04/07/2013  . Hypertension    takes Diltiazem daily   . Hypomagnesemia  08/02/2015  . Hypophosphatemia 11/08/2017  . Immunosuppression ([place]) 07/25/2015  . Memory changes   . Morbid obesity ([place])   . Nonischemic dilated cardiomyopathy ([place])   . Obesity 04/07/2013  . Oligouria   . OSA on CPAP   . PAF (paroxysmal atrial fibrillation) ([facility])     HX OF CEREBRAL BLEED WHILE ON COUMADIN-SO PT NOT ON ANY BLOOD THINNERS NOW  . Peripheral vascular disease ([place])   . Presence of Watchman left atrial appendage closure device 10/04/2017  . Proteinuria 09/04/2016  . Psoriasis 08/07/2016  . 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 Sepsis ([place]) 11/08/2017  . Stroke [facility]) 2009   denies residual on 02/04/2018;  hemorrhagic now off coumadin  . Stroke due to intracerebral hemorrhage ([place]) 2009  . Suture reaction 09/04/2016    Past Surgical History:  Procedure Laterality Date  . AV FISTULA PLACEMENT  09/28/2011   Procedure: ARTERIOVENOUS (AV) FISTULA CREATION;  Surgeon: Rosetta Posner, MD;  Location: [place];  Service: Vascular;  Laterality: Left;  . CARDIAC CATHETERIZATION    . CARDIAC VALVE SURGERY  1960    FOR ATRIAL SEPTAL DEFECT  . CHOLECYSTECTOMY     1980's  . COLONOSCOPY    . CYSTOSCOPY     many yrs ago  . FEMUR FRACTURE SURGERY Right 03/2011; 09/03/2011   "had 2, 2 wk apart in 2012; broke it again 08/2011 & had OR"  . FRACTURE SURGERY    . I&D EXTREMITY  09/15/2011  Procedure: IRRIGATION AND DEBRIDEMENT EXTREMITY w REMOVAL OF HARDWARE;  Surgeon: [name], MD;  Location: [place];  Service: Orthopedics;  Laterality: Right;  . INCISION AND DRAINAGE HIP  12/21/2011   Procedure: IRRIGATION AND DEBRIDEMENT HIP;  Surgeon: [name], MD;  Location: [place];  Service: Orthopedics;  Laterality: Right;  I&D RIGHT HIP WITH PLACEMENT ANTIBIOTIC SPACER  . INSERTION OF DIALYSIS CATHETER     Procedure: INSERTION OF DIALYSIS CATHETER;  Surgeon: Rosetta Posner, MD;  Location: [place];  Service: Vascular;  Laterality: Right;  . IRRIGATION AND DEBRIDEMENT ABSCESS  12/21/11   right hip    . JOINT REPLACEMENT    . KIDNEY TRANSPLANT  07/19/15  . LAPAROSCOPIC GASTRIC BANDING  2008  . LEFT ATRIAL APPENDAGE OCCLUSION  10/04/2017  . OPEN REDUCTION INTERNAL FIXATION (ORIF) DISTAL RADIAL FRACTURE Right 10/18/2016   Procedure: OPEN REDUCTION INTERNAL FIXATION (ORIF) RIGHT DISTAL RADIAL FRACTURE;  Surgeon: Marchia Bond, MD;  Location: [place];  Service: Orthopedics;  Laterality: Right;  . TOTAL HIP ARTHROPLASTY Right 02/2011   right THA 02/2011, I&D/removal of hardware 09/2011,, repeat I&D Jun 2013, reimplantation R THA 03-26-2012  . TOTAL HIP REVISION  03/25/2012   Procedure: TOTAL HIP REVISION;  Surgeon: [name], MD;  Location: [facility];  Service: Orthopedics;  Laterality: Right;  Right Total Hip Reimplantation  . TUBAL LIGATION    . VENA CAVA FILTER PLACEMENT  11/2011    Consults     Significant Tests   US Renal Transplant W/doppler  Result Date: 02/03/2018 CLINICAL DATA:  Acute renal failure. EXAM: ULTRASOUND OF RENAL TRANSPLANT WITH RENAL DOPPLER ULTRASOUND TECHNIQUE: Ultrasound examination of the renal transplant was performed with gray-scale, color and duplex doppler evaluation. Technologist describes technically difficult study with limited vascular evaluation attributed to body habitus and overlying bowel gas. COMPARISON:  None. FINDINGS: Transplant kidney location: Right lower quadrant Transplant Kidney: Length: 11 cm. Normal in size and parenchymal echogenicity. No evidence of mass or hydronephrosis. No peri-transplant fluid collection seen. Color flow in the main renal artery:  Limited evaluation Color flow in the main renal vein:  Limited evaluation Duplex Doppler Evaluation: Main Renal Artery Resistive Index: Not obtained Venous waveform in main renal vein:  Not demonstrated Intrarenal resistive index in upper pole:  Not obtained (normal 0.6-0.8; equivocal 0.8-0.9; abnormal >= 0.9) Intrarenal resistive index in lower pole: Not obtained (normal 0.6-0.8;  equivocal 0.8-0.9; abnormal >= 0.9) Bladder: Normal for degree of bladder distention. Unilateral ureteral jet documented. Other findings: There is a 8.9 x 5.5 x 6.3 cm deep subcutaneous fluid collection superior and lateral to the transplant kidney without significant mass effect upon the kidney or collecting system. IMPRESSION: 1. No hydronephrosis of transplant kidney. Limited vascular evaluation. 2. 8.9 cm deep subcutaneous fluid collection superior and lateral to the transplant kidney without significant mass effect on the kidney. Electronically Signed   By: Lucrezia Europe M.D.   On: 02/03/2018 21:52   Dg Chest Portable 1 View  Result Date: 02/03/2018 CLINICAL DATA:  Chest pain. Shortness of breath. Generalized weakness. EXAM: PORTABLE CHEST 1 VIEW COMPARISON:  Two-view chest x-ray 11/03/2017. FINDINGS: Heart is enlarged. Defibrillator pad is in place. Lung volumes are low. Mild pulmonary vascular congestion is present. No focal airspace disease is evident. IMPRESSION: 1. Cardiomegaly and mild pulmonary vascular congestion without frank edema. Early congestive heart failure is not excluded. 2. No focal airspace disease. Electronically Signed   By: San Morelle M.D.   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 fib RVR, she was cardioverted in the ER, cardiology and nephrology were consulted and she was admitted to the hospital service.   Plan -    1.   Paroxysmal atrial flutter with 2 is to 1 conduction likely through an aberrancy with wide-complex tachycardia.  Mali vas 2 score of at least 4.  Cardioverted in the ER, currently in sinus, EP following, continue propafenone per cardiology, currently on heparin drip instead of Xarelto due to worsening renal function.  2.  Oliguric ARF on CKD 4 in a patient with transplant kidneys.  Baseline creatinine 1.7, this appears to be prerenal azotemia, nephrology on board, avoid nephrotoxins, hydrate and monitor.  Foley catheter is to monitor urine output on 02/04/2018, urine output extremely poor on 02/04/2018, discussed with Dr. Justin Mend nephrologist, challenge with IV fluid bolus and Lasix IV. I then discussed the case with nephrology fellow on-call Dr. Berline Lopes at [facility] who graciously accepted the patient in transfer.  3.  Urinary retention.  See #2 above.  Will check UA with culture and sensitivity.  4.  HX of DVT.  Status post IVC filter, currently Xarelto on hold and on heparin drip due to #2 above.  5.  Polycythemia.  Most likely due to severe dehydration better with IV fluids continue to monitor.  6.  OSA.  CPAP at night.  7.  HX of chronic systolic heart failure known EF around 25%.  Currently no acute issues, compensated, no ACE/ARB due to #2 above, once hemodynamically stable low-dose beta-blocker can be tried.  For now we will avoid hypotension due to worsening renal failure.     Subjective:   [name] today has no headache,no chest  pain,no new weakness tingling or numbness, mild nausea , mild suprapubic ache, but no SOB  Objective:   Blood pressure (!) 153/75, pulse 78, temperature 98.3 F (36.8 C), temperature source Oral, resp. rate (!) 23, height 5\' 5"  (1.651 m), weight 100.4 kg (221 lb 5.5 oz), SpO2 93 %.  Intake/Output Summary (Last 24 hours) at 02/04/2018 1556 Last data filed at 02/04/2018 1300 Gross per 24 hour  Intake 1829.43 ml  Output -  Net 1829.43 ml     Exam Awake Alert, Oriented *3, No new F.N deficits, Normal affect [place].AT,PERRAL Supple Neck,No JVD, No cervical lymphadenopathy appriciated.  Symmetrical Chest wall movement, Good air movement bilaterally, CTAB RRR,No Gallops,Rubs or new Murmurs, No Parasternal Heave +ve B.Sounds, Abd Soft, Non tender, No organomegaly appriciated, No rebound -guarding or rigidity. No Cyanosis, Clubbing or edema, No new Rash or bruise  Data Review  CBC w Diff:  Lab Results  Component Value Date   WBC 10.9 (H) 02/04/2018   HGB 12.3 02/04/2018   HCT 39.3 02/04/2018   PLT 199 02/04/2018   LYMPHOPCT 32 01/22/2012   MONOPCT 18 (H) 01/22/2012   EOSPCT 1 01/22/2012   BASOPCT 1 01/22/2012   CMP:  Lab Results  Component Value Date  NA 139 02/04/2018   NA 139 02/04/2018   K 4.7 02/04/2018   K 4.7 02/04/2018   CL 102 02/04/2018   CL 102 02/04/2018   CO2 23 02/04/2018   CO2 23 02/04/2018   BUN 39 (H) 02/04/2018   BUN 39 (H) 02/04/2018   CREATININE 4.65 (H) 02/04/2018   CREATININE 4.71 (H) 02/04/2018   PROT 7.1 02/03/2018   ALBUMIN 3.3 (L) 02/04/2018   BILITOT 3.4 (H) 02/03/2018   ALKPHOS 74 02/03/2018   AST 76 (H) 02/03/2018   ALT 57 (H) 02/03/2018  .  Home Medications  Prior to Admission medications   Medication Sig Start Date End Date Taking? Authorizing Provider  albuterol (PROVENTIL HFA;VENTOLIN HFA) 108 (90 Base) MCG/ACT inhaler Inhale 2 puffs into the lungs every 6 (six) hours as needed for wheezing or shortness of breath. 06/21/17  Yes Roma Schanz R, DO  budesonide-formoterol (SYMBICORT) 160-4.5 MCG/ACT inhaler Inhale 2 puffs into the lungs 2 (two) times daily. 10/02/17  Yes Parrett, Tammy S, NP  acetaminophen (TYLENOL) 500 MG tablet Take 500 mg by mouth every 6 (six) hours as needed.    [provider]  aspirin EC 81 MG tablet Take 81 mg by mouth. 07/21/15   [provider]  carvedilol (COREG) 25 MG tablet Take 25 mg by mouth 2 (two) times daily with a meal.     [provider]  docusate sodium (COLACE) 100 MG capsule Take 100 mg by mouth daily as needed (For constipation.).     [provider]  ferrous sulfate 325 (65 FE) MG tablet Take 325 mg by mouth daily with breakfast.    [provider]  furosemide (LASIX) 40 MG tablet Take 80 mg by mouth daily.    [provider]  furosemide (LASIX) 80 MG tablet Take 80 mg by mouth 2 (two) times daily.    [provider]  mycophenolate (MYFORTIC) 180 MG EC tablet Take 180 mg by mouth 2 (two) times daily.    [provider]  NONFORMULARY OR COMPOUNDED ITEM Compression stockings --thigh high  20-30 mm hg Dx  Low ext edema 12/10/17   Carollee Herter, Alferd Apa, DO  potassium chloride SA (K-DUR,KLOR-CON) 20 MEQ tablet Take 20 mEq by mouth.     [provider]  predniSONE (DELTASONE) 5 MG tablet Take 5 mg by mouth daily with breakfast.    [provider]  propafenone (RYTHMOL) 150 MG tablet Take 150 mg by mouth every 8 (eight) hours.    [provider]  rivaroxaban (XARELTO) 20 MG TABS tablet Take 1 tablet (20 mg total) by mouth daily. 11/04/17   Omar Person, NP  sertraline (ZOLOFT) 50 MG tablet Take 1 tablet (50 mg total) by mouth daily. 04/19/17   [name], DO  Tacrolimus (ENVARSUS XR) 1 MG TB24 Take 2 mg by mouth daily.     [provider]    Inpatient Medications  Scheduled Meds: . carvedilol  3.125 mg Oral BID WC  . feeding supplement (NEPRO CARB STEADY)  237 mL Oral BID BM  . furosemide  80 mg Intravenous Once  . mometasone-formoterol  2 puff Inhalation BID  . predniSONE  5 mg Oral Q breakfast  . propafenone  150 mg Oral Q8H  . sertraline  50 mg Oral Daily  . sodium bicarbonate  650 mg Oral BID  . Tacrolimus  2 mg Oral Daily   Continuous Infusions: . sodium chloride 75 mL/hr at 02/04/18 1110  .  heparin 800 Units/hr (02/04/18 1406)  . magnesium sulfate 1 - 4 g bolus IVPB Stopped (02/03/18 1500)   . sodium chloride 1,000 mL (02/04/18 1431)   PRN Meds:.acetaminophen **OR** acetaminophen, albuterol, bisacodyl, HYDROcodone-acetaminophen, ondansetron **OR** ondansetron (ZOFRAN) IV, senna-docusate   The risks and  benefits including possible death-disability of treating patient at this facility vs transporting the patinet to a higher level of care were discussed in detail with the husband and the plan was acceptable to them.   Total Time in preparing paper work, todays exam and data evaluation 35 minutes  Lala Lund M.D on 02/04/2018 at 3:56 PM  [facility] Office  [phone]

## 2018-02-05 ENCOUNTER — Encounter: Payer: Medicare Other | Admitting: Physical Therapy

## 2018-02-05 DIAGNOSIS — R2681 Unsteadiness on feet: Secondary | ICD-10-CM | POA: Diagnosis not present

## 2018-02-05 DIAGNOSIS — F419 Anxiety disorder, unspecified: Secondary | ICD-10-CM | POA: Diagnosis not present

## 2018-02-05 DIAGNOSIS — F418 Other specified anxiety disorders: Secondary | ICD-10-CM | POA: Diagnosis not present

## 2018-02-05 DIAGNOSIS — Z96641 Presence of right artificial hip joint: Secondary | ICD-10-CM | POA: Diagnosis present

## 2018-02-05 DIAGNOSIS — Z7951 Long term (current) use of inhaled steroids: Secondary | ICD-10-CM | POA: Diagnosis not present

## 2018-02-05 DIAGNOSIS — I2699 Other pulmonary embolism without acute cor pulmonale: Secondary | ICD-10-CM | POA: Diagnosis not present

## 2018-02-05 DIAGNOSIS — D899 Disorder involving the immune mechanism, unspecified: Secondary | ICD-10-CM | POA: Diagnosis not present

## 2018-02-05 DIAGNOSIS — I132 Hypertensive heart and chronic kidney disease with heart failure and with stage 5 chronic kidney disease, or end stage renal disease: Secondary | ICD-10-CM | POA: Diagnosis not present

## 2018-02-05 DIAGNOSIS — Z7982 Long term (current) use of aspirin: Secondary | ICD-10-CM | POA: Diagnosis not present

## 2018-02-05 DIAGNOSIS — I480 Paroxysmal atrial fibrillation: Secondary | ICD-10-CM | POA: Diagnosis not present

## 2018-02-05 DIAGNOSIS — E8770 Fluid overload, unspecified: Secondary | ICD-10-CM | POA: Diagnosis not present

## 2018-02-05 DIAGNOSIS — D649 Anemia, unspecified: Secondary | ICD-10-CM | POA: Diagnosis not present

## 2018-02-05 DIAGNOSIS — T8619 Other complication of kidney transplant: Secondary | ICD-10-CM | POA: Diagnosis not present

## 2018-02-05 DIAGNOSIS — I509 Heart failure, unspecified: Secondary | ICD-10-CM | POA: Diagnosis not present

## 2018-02-05 DIAGNOSIS — R262 Difficulty in walking, not elsewhere classified: Secondary | ICD-10-CM | POA: Diagnosis not present

## 2018-02-05 DIAGNOSIS — Z7902 Long term (current) use of antithrombotics/antiplatelets: Secondary | ICD-10-CM | POA: Diagnosis not present

## 2018-02-05 DIAGNOSIS — K1230 Oral mucositis (ulcerative), unspecified: Secondary | ICD-10-CM | POA: Diagnosis not present

## 2018-02-05 DIAGNOSIS — I4891 Unspecified atrial fibrillation: Secondary | ICD-10-CM | POA: Diagnosis not present

## 2018-02-05 DIAGNOSIS — Z743 Need for continuous supervision: Secondary | ICD-10-CM | POA: Diagnosis not present

## 2018-02-05 DIAGNOSIS — R8271 Bacteriuria: Secondary | ICD-10-CM | POA: Diagnosis not present

## 2018-02-05 DIAGNOSIS — E669 Obesity, unspecified: Secondary | ICD-10-CM | POA: Diagnosis present

## 2018-02-05 DIAGNOSIS — H9190 Unspecified hearing loss, unspecified ear: Secondary | ICD-10-CM | POA: Diagnosis present

## 2018-02-05 DIAGNOSIS — N390 Urinary tract infection, site not specified: Secondary | ICD-10-CM | POA: Diagnosis not present

## 2018-02-05 DIAGNOSIS — F329 Major depressive disorder, single episode, unspecified: Secondary | ICD-10-CM | POA: Diagnosis not present

## 2018-02-05 DIAGNOSIS — I120 Hypertensive chronic kidney disease with stage 5 chronic kidney disease or end stage renal disease: Secondary | ICD-10-CM | POA: Diagnosis not present

## 2018-02-05 DIAGNOSIS — N186 End stage renal disease: Secondary | ICD-10-CM | POA: Diagnosis not present

## 2018-02-05 DIAGNOSIS — G4733 Obstructive sleep apnea (adult) (pediatric): Secondary | ICD-10-CM | POA: Diagnosis not present

## 2018-02-05 DIAGNOSIS — I82409 Acute embolism and thrombosis of unspecified deep veins of unspecified lower extremity: Secondary | ICD-10-CM | POA: Diagnosis not present

## 2018-02-05 DIAGNOSIS — J45909 Unspecified asthma, uncomplicated: Secondary | ICD-10-CM | POA: Diagnosis present

## 2018-02-05 DIAGNOSIS — Z95828 Presence of other vascular implants and grafts: Secondary | ICD-10-CM | POA: Diagnosis not present

## 2018-02-05 DIAGNOSIS — E876 Hypokalemia: Secondary | ICD-10-CM | POA: Diagnosis not present

## 2018-02-05 DIAGNOSIS — Z7901 Long term (current) use of anticoagulants: Secondary | ICD-10-CM | POA: Diagnosis not present

## 2018-02-05 DIAGNOSIS — K1239 Other oral mucositis (ulcerative): Secondary | ICD-10-CM | POA: Diagnosis not present

## 2018-02-05 DIAGNOSIS — I5033 Acute on chronic diastolic (congestive) heart failure: Secondary | ICD-10-CM | POA: Diagnosis not present

## 2018-02-05 DIAGNOSIS — I361 Nonrheumatic tricuspid (valve) insufficiency: Secondary | ICD-10-CM | POA: Diagnosis not present

## 2018-02-05 DIAGNOSIS — R531 Weakness: Secondary | ICD-10-CM | POA: Diagnosis not present

## 2018-02-05 DIAGNOSIS — Z86711 Personal history of pulmonary embolism: Secondary | ICD-10-CM | POA: Diagnosis not present

## 2018-02-05 DIAGNOSIS — R9431 Abnormal electrocardiogram [ECG] [EKG]: Secondary | ICD-10-CM | POA: Diagnosis not present

## 2018-02-05 DIAGNOSIS — Z79899 Other long term (current) drug therapy: Secondary | ICD-10-CM | POA: Diagnosis not present

## 2018-02-05 DIAGNOSIS — I639 Cerebral infarction, unspecified: Secondary | ICD-10-CM | POA: Diagnosis not present

## 2018-02-05 DIAGNOSIS — R2689 Other abnormalities of gait and mobility: Secondary | ICD-10-CM | POA: Diagnosis not present

## 2018-02-05 DIAGNOSIS — Z9889 Other specified postprocedural states: Secondary | ICD-10-CM | POA: Diagnosis not present

## 2018-02-05 DIAGNOSIS — E875 Hyperkalemia: Secondary | ICD-10-CM | POA: Diagnosis not present

## 2018-02-05 DIAGNOSIS — I5023 Acute on chronic systolic (congestive) heart failure: Secondary | ICD-10-CM | POA: Diagnosis not present

## 2018-02-05 DIAGNOSIS — R031 Nonspecific low blood-pressure reading: Secondary | ICD-10-CM | POA: Diagnosis not present

## 2018-02-05 DIAGNOSIS — T451X5A Adverse effect of antineoplastic and immunosuppressive drugs, initial encounter: Secondary | ICD-10-CM | POA: Diagnosis present

## 2018-02-05 DIAGNOSIS — Z9181 History of falling: Secondary | ICD-10-CM | POA: Diagnosis not present

## 2018-02-05 DIAGNOSIS — Z792 Long term (current) use of antibiotics: Secondary | ICD-10-CM | POA: Diagnosis not present

## 2018-02-05 DIAGNOSIS — N170 Acute kidney failure with tubular necrosis: Secondary | ICD-10-CM | POA: Diagnosis present

## 2018-02-05 DIAGNOSIS — N179 Acute kidney failure, unspecified: Secondary | ICD-10-CM | POA: Diagnosis not present

## 2018-02-05 DIAGNOSIS — I447 Left bundle-branch block, unspecified: Secondary | ICD-10-CM | POA: Diagnosis not present

## 2018-02-05 DIAGNOSIS — I440 Atrioventricular block, first degree: Secondary | ICD-10-CM | POA: Diagnosis not present

## 2018-02-05 DIAGNOSIS — R488 Other symbolic dysfunctions: Secondary | ICD-10-CM | POA: Diagnosis not present

## 2018-02-05 DIAGNOSIS — I10 Essential (primary) hypertension: Secondary | ICD-10-CM | POA: Diagnosis not present

## 2018-02-05 DIAGNOSIS — Z8673 Personal history of transient ischemic attack (TIA), and cerebral infarction without residual deficits: Secondary | ICD-10-CM | POA: Diagnosis not present

## 2018-02-05 DIAGNOSIS — Z86718 Personal history of other venous thrombosis and embolism: Secondary | ICD-10-CM | POA: Diagnosis not present

## 2018-02-05 DIAGNOSIS — I499 Cardiac arrhythmia, unspecified: Secondary | ICD-10-CM | POA: Diagnosis not present

## 2018-02-05 DIAGNOSIS — R197 Diarrhea, unspecified: Secondary | ICD-10-CM | POA: Diagnosis not present

## 2018-02-05 DIAGNOSIS — I2510 Atherosclerotic heart disease of native coronary artery without angina pectoris: Secondary | ICD-10-CM | POA: Diagnosis present

## 2018-02-05 DIAGNOSIS — I110 Hypertensive heart disease with heart failure: Secondary | ICD-10-CM | POA: Diagnosis not present

## 2018-02-05 DIAGNOSIS — I4892 Unspecified atrial flutter: Secondary | ICD-10-CM | POA: Diagnosis not present

## 2018-02-05 DIAGNOSIS — M7989 Other specified soft tissue disorders: Secondary | ICD-10-CM | POA: Diagnosis not present

## 2018-02-05 DIAGNOSIS — Z940 Kidney transplant status: Secondary | ICD-10-CM | POA: Diagnosis not present

## 2018-02-05 DIAGNOSIS — I5043 Acute on chronic combined systolic (congestive) and diastolic (congestive) heart failure: Secondary | ICD-10-CM | POA: Diagnosis present

## 2018-02-05 DIAGNOSIS — I340 Nonrheumatic mitral (valve) insufficiency: Secondary | ICD-10-CM | POA: Diagnosis not present

## 2018-02-05 DIAGNOSIS — B0089 Other herpesviral infection: Secondary | ICD-10-CM | POA: Diagnosis not present

## 2018-02-05 DIAGNOSIS — B009 Herpesviral infection, unspecified: Secondary | ICD-10-CM | POA: Diagnosis not present

## 2018-02-05 DIAGNOSIS — M6281 Muscle weakness (generalized): Secondary | ICD-10-CM | POA: Diagnosis not present

## 2018-02-05 DIAGNOSIS — Z992 Dependence on renal dialysis: Secondary | ICD-10-CM | POA: Diagnosis not present

## 2018-02-05 DIAGNOSIS — I482 Chronic atrial fibrillation: Secondary | ICD-10-CM | POA: Diagnosis not present

## 2018-02-05 LAB — CBC
HCT: 39.9 % (ref 36.0–46.0)
Hemoglobin: 12.4 g/dL (ref 12.0–15.0)
MCH: 26.1 pg (ref 26.0–34.0)
MCHC: 31.1 g/dL (ref 30.0–36.0)
MCV: 83.8 fL (ref 78.0–100.0)
Platelets: 194 10*3/uL (ref 150–400)
RBC: 4.76 MIL/uL (ref 3.87–5.11)
RDW: 20.6 % — ABNORMAL HIGH (ref 11.5–15.5)
WBC: 12.7 10*3/uL — ABNORMAL HIGH (ref 4.0–10.5)

## 2018-02-05 LAB — APTT: aPTT: 98 seconds — ABNORMAL HIGH (ref 24–36)

## 2018-02-05 LAB — RENAL FUNCTION PANEL
Albumin: 3.4 g/dL — ABNORMAL LOW (ref 3.5–5.0)
Anion gap: 13 (ref 5–15)
BUN: 46 mg/dL — ABNORMAL HIGH (ref 8–23)
CO2: 24 mmol/L (ref 22–32)
Calcium: 9.1 mg/dL (ref 8.9–10.3)
Chloride: 99 mmol/L (ref 98–111)
Creatinine, Ser: 5.27 mg/dL — ABNORMAL HIGH (ref 0.44–1.00)
GFR calc Af Amer: 9 mL/min — ABNORMAL LOW (ref >60)
GFR calc non Af Amer: 8 mL/min — ABNORMAL LOW (ref >60)
Glucose, Bld: 89 mg/dL (ref 70–99)
Phosphorus: 5.1 mg/dL — ABNORMAL HIGH (ref 2.5–4.6)
Potassium: 4.8 mmol/L (ref 3.5–5.1)
Sodium: 136 mmol/L (ref 135–145)

## 2018-02-05 LAB — HEPARIN LEVEL (UNFRACTIONATED): Heparin Unfractionated: 1.64 IU/mL — ABNORMAL HIGH (ref 0.30–0.70)

## 2018-02-05 LAB — MRSA PCR SCREENING: MRSA by PCR: NEGATIVE

## 2018-02-05 LAB — TACROLIMUS LEVEL: Tacrolimus (FK506) - LabCorp: 13 ng/mL (ref 2.0–20.0)

## 2018-02-05 MED ORDER — FUROSEMIDE 10 MG/ML IJ SOLN
80.0000 mg | Freq: Once | INTRAMUSCULAR | Status: AC
Start: 2018-02-05 — End: 2018-02-05
  Administered 2018-02-05: 80 mg via INTRAVENOUS
  Filled 2018-02-05: qty 8

## 2018-02-05 NOTE — Progress Notes (Signed)
[place] for Heparin Indication: atrial fibrillation  Allergies  Allergen Reactions  . Ace Inhibitors Other (See Comments)    Reaction unknown  . 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 Warfarin Sodium Other (See Comments)    Caused her to have a stroke    Patient Measurements: Ht: 5'5" Wt: 100.4 Heparin Dosing Weight: 80 kg  Vital Signs: Temp: 98.3 F (36.8 C) (07/24 0023) Temp Source: Oral (07/24 0023) BP: 130/56 (07/24 0023) Pulse Rate: 76 (07/24 0023)  Labs: Recent Labs    02/03/18 1400  02/03/18 1458  02/04/18 0600 02/04/18 0611 02/04/18 1047 02/04/18 2155 02/05/18 0002  HGB 14.4   < > 18.4*  --   --  12.3  --   --  12.4  HCT 47.9*   < > 54.0*  --   --  39.3  --   --  39.9  PLT 258  --   --   --   --  199  --   --  194  APTT  --   --   --    < > >200*  --  >200* >200* 98*  HEPARINUNFRC  --   --   --   --  >2.20*  --   --   --  1.64*  CREATININE  --    < > 5.00*  --   --  4.71*  4.65*  --   --  5.27*  TROPONINI  --   --   --   --   --  0.38*  --   --   --    < > = values in this interval not displayed.    Estimated Creatinine Clearance: 11.8 mL/min (A) (by C-G formula based on SCr of 5.27 mg/dL (H)).  Assessment: 72 y.o. female with Afib, Xarelto on hold, for heparin  Goal of Therapy:  Heparin level 0.3-0.7 units/ml aPTT 66-102 seconds Monitor platelets by anticoagulation protocol: Yes   Plan:  Continue Heparin at current rate  Follow-up am labs.   Phillis Knack, PharmD, BCPS 02/05/2018      12:37 AM

## 2018-02-05 NOTE — Care Management Note (Signed)
Case Management Note  Patient Details  Name: [name] MRN: [id number] Date of Birth: [birth year]  Subjective/Objective:  Pt presented for Atrial Fib- plan to transfer to [facility]- Nephrology to follow.                  Action/Plan: [facility] to provide transportation to [facility]. No further needs from CM at this time.   Expected Discharge Date:  02/04/18               Expected Discharge Plan:  Acute to Acute Transfer  In-House Referral:  NA  Discharge planning Services  CM Consult  Post Acute Care Choice:  NA Choice offered to:  NA  DME Arranged:  N/A DME Agency:  NA  HH Arranged:  NA HH Agency:  NA  Status of Service:  Completed, signed off  If discussed at [place] of Stay Meetings, dates discussed:    Additional Comments:  Bethena Roys, RN 02/05/2018, 10:58 AM

## 2018-02-05 NOTE — Progress Notes (Signed)
See full discharge summary done on 02/04/2018 by Dr. Lala Lund. No changes made. Patient stable for transfer to [place] [facility] Pager [phone]  If 7PM-7AM, please contact night-coverage [email or web address] Password [facility] 02/05/2018, 8:13 AM

## 2018-02-05 NOTE — Progress Notes (Signed)
Report called to [facility] to transport pt to [facility].

## 2018-02-05 NOTE — Progress Notes (Signed)
[facility] ROUNDING NOTE   Subjective:   Renal function worse with oliguria only 200 cc out last night   Hemodynamics look good   Oxygenation 93% room air   Objective:  Vital signs in last 24 hours:  Temp:  [97.4 F (36.3 C)-98.5 F (36.9 C)] 98 F (36.7 C) (07/24 0820) Pulse Rate:  [22-83] 77 (07/24 0820) Resp:  [20-28] 20 (07/24 0820) BP: (130-152)/(56-73) 136/71 (07/24 0820) SpO2:  [90 %-94 %] 93 % (07/24 0820) Weight:  [222 lb 3.6 oz (100.8 kg)] 222 lb 3.6 oz (100.8 kg) (07/24 0500)  Weight change: 14.5 oz (0.41 kg) Filed Weights   02/03/18 1500 02/04/18 0353 02/05/18 0500  Weight: 221 lb 5.1 oz (100.4 kg) 221 lb 5.5 oz (100.4 kg) 222 lb 3.6 oz (100.8 kg)    Intake/Output: I/O last 3 completed shifts: In: 2785.4 [P.O.:440; I.V.:2345.4] Out: 200 [Urine:200]   Intake/Output this shift:  No intake/output data recorded.  CVS- RRR RS- CTA     93% room air ABD-  Diminished sounds  Non tender  EXT-  1 + LE edema   Basic Metabolic Panel: Recent Labs  Lab 02/03/18 1425 02/03/18 1447 02/03/18 1458 02/04/18 0611 02/05/18 0002  NA 129*  129* 135 132* 139  139 136  K 8.3*  8.3* 5.6* 5.4* 4.7  4.7 4.8  CL 99  99 92* 97* 102  102 99  CO2  --  18*  --  23  23 24   GLUCOSE 36*  36* 37* 37* 86  88 89  BUN 64*  64* 39* 42* 39*  39* 46*  CREATININE 5.00*  5.00* 5.00* 5.00* 4.71*  4.65* 5.27*  CALCIUM  --  10.8*  --  8.7*  8.6* 9.1  MG  --  2.1  --   --   --   PHOS  --   --   --  5.0* 5.1*    Liver Function Tests: Recent Labs  Lab 02/03/18 1447 02/04/18 0611 02/05/18 0002  AST 76*  --   --   ALT 57*  --   --   ALKPHOS 74  --   --   BILITOT 3.4*  --   --   PROT 7.1  --   --   ALBUMIN 4.1 3.3* 3.4*   No results for input(s): LIPASE, AMYLASE in the last 168 hours. No results for input(s): AMMONIA in the last 168 hours.  CBC: Recent Labs  Lab 02/03/18 1400 02/03/18 1425 02/03/18 1458 02/04/18 0611 02/05/18 0002  WBC 13.0*  --    --  10.9* 12.7*  HGB 14.4 18.7*  18.7* 18.4* 12.3 12.4  HCT 47.9* 55.0*  55.0* 54.0* 39.3 39.9  MCV 87.1  --   --  83.4 83.8  PLT 258  --   --  199 194    Cardiac Enzymes: Recent Labs  Lab 02/04/18 0611  TROPONINI 0.38*    BNP: Invalid input(s): POCBNP  CBG: Recent Labs  Lab 02/03/18 1439 02/03/18 1506 02/03/18 1537  GLUCAP 15* 5 154*    Microbiology: Results for orders placed or performed during the hospital encounter of 02/03/18  MRSA PCR Screening     Status: None   Collection Time: 02/05/18 12:17 AM  Result Value Ref Range Status   MRSA by PCR NEGATIVE NEGATIVE Final    Comment:        The GeneXpert MRSA Assay (FDA approved for NASAL specimens only), is one component of a comprehensive MRSA colonization  surveillance program. It is not intended to diagnose MRSA infection nor to guide or monitor treatment for MRSA infections. Performed at [facility], [place] [address], [place], [place] [address]     Coagulation Studies: No results for input(s): LABPROT, INR in the last 72 hours.  Urinalysis: Recent Labs    02/04/18 1540  COLORURINE AMBER*  LABSPEC 1.017  PHURINE 5.0  GLUCOSEU NEGATIVE  HGBUR LARGE*  BILIRUBINUR NEGATIVE  KETONESUR NEGATIVE  PROTEINUR 100*  NITRITE NEGATIVE  LEUKOCYTESUR MODERATE*      Imaging: US Renal Transplant W/doppler  Result Date: 02/03/2018 CLINICAL DATA:  Acute renal failure. EXAM: ULTRASOUND OF RENAL TRANSPLANT WITH RENAL DOPPLER ULTRASOUND TECHNIQUE: Ultrasound examination of the renal transplant was performed with gray-scale, color and duplex doppler evaluation. Technologist describes technically difficult study with limited vascular evaluation attributed to body habitus and overlying bowel gas. COMPARISON:  None. FINDINGS: Transplant kidney location: Right lower quadrant Transplant Kidney: Length: 11 cm. Normal in size and parenchymal echogenicity. No evidence of mass or hydronephrosis. No peri-transplant  fluid collection seen. Color flow in the main renal artery:  Limited evaluation Color flow in the main renal vein:  Limited evaluation Duplex Doppler Evaluation: Main Renal Artery Resistive Index: Not obtained Venous waveform in main renal vein:  Not demonstrated Intrarenal resistive index in upper pole:  Not obtained (normal 0.6-0.8; equivocal 0.8-0.9; abnormal >= 0.9) Intrarenal resistive index in lower pole: Not obtained (normal 0.6-0.8; equivocal 0.8-0.9; abnormal >= 0.9) Bladder: Normal for degree of bladder distention. Unilateral ureteral jet documented. Other findings: There is a 8.9 x 5.5 x 6.3 cm deep subcutaneous fluid collection superior and lateral to the transplant kidney without significant mass effect upon the kidney or collecting system. IMPRESSION: 1. No hydronephrosis of transplant kidney. Limited vascular evaluation. 2. 8.9 cm deep subcutaneous fluid collection superior and lateral to the transplant kidney without significant mass effect on the kidney. Electronically Signed   By: Lucrezia Europe M.D.   On: 02/03/2018 21:52   Dg Chest Portable 1 View  Result Date: 02/03/2018 CLINICAL DATA:  Chest pain. Shortness of breath. Generalized weakness. EXAM: PORTABLE CHEST 1 VIEW COMPARISON:  Two-view chest x-ray 11/03/2017. FINDINGS: Heart is enlarged. Defibrillator pad is in place. Lung volumes are low. Mild pulmonary vascular congestion is present. No focal airspace disease is evident. IMPRESSION: 1. Cardiomegaly and mild pulmonary vascular congestion without frank edema. Early congestive heart failure is not excluded. 2. No focal airspace disease. Electronically Signed   By: San Morelle M.D.   On: 02/03/2018 16:16     Medications:   . sodium chloride 75 mL/hr at 02/05/18 0349  . heparin 800 Units/hr (02/05/18 0343)  . magnesium sulfate 1 - 4 g bolus IVPB Stopped (02/03/18 1500)   . carvedilol  3.125 mg Oral BID WC  . feeding supplement (NEPRO CARB STEADY)  237 mL Oral BID BM  .  furosemide  80 mg Intravenous Once  . mometasone-formoterol  2 puff Inhalation BID  . predniSONE  5 mg Oral Q breakfast  . propafenone  150 mg Oral Q8H  . sertraline  50 mg Oral Daily  . sodium bicarbonate  650 mg Oral BID  . Tacrolimus  2 mg Oral Daily   acetaminophen **OR** acetaminophen, albuterol, bisacodyl, HYDROcodone-acetaminophen, ondansetron **OR** ondansetron (ZOFRAN) IV, senna-docusate  Assessment/ Plan:    1. Acute renal failure with increase in creatinine   there is no hydronephrosis on the renal ultrasound    Urine has blood protein and casts  BKV and CMV have also been ordered  Previous history of ARF  4/19              (a)  Will challenge with additional lasix 80 mg IV         (b)  Avoid nephrotoxins  Contrast         (c)  Tacrolimus levels 13.9  Consider reducing dose  ( patient being transferred to transplant center today)           (d)  Consider rejection  And may need biopsy  2. Hyperkalemia better 3. Polycythemia  Most likely obesity hypoventilation 4. Immunosuppression   Tacrolimus levels are generous 13.9  (Usual maintenance levels 5-8 )  She is to be transferred to [facility] and will allow them to change dose  5. Atrial fibrillation/flutter    Watchman 3/19    xeralto and propafanone  6. 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

## 2018-02-06 ENCOUNTER — Telehealth: Payer: Self-pay

## 2018-02-06 LAB — URINE CULTURE: Culture: NO GROWTH

## 2018-02-09 LAB — CULTURE, BLOOD (ROUTINE X 2)
Culture: NO GROWTH
Culture: NO GROWTH
Special Requests: ADEQUATE
Special Requests: ADEQUATE

## 2018-02-09 LAB — BK QUANT PCR (PLASMA/SERUM)
BK Quantitaion PCR: NEGATIVE copies/mL
Log10 BK Qn PCR: UNDETERMINED log10copy/mL

## 2018-02-10 ENCOUNTER — Encounter: Payer: Medicare Other | Admitting: Physical Therapy

## 2018-02-10 NOTE — Telephone Encounter (Signed)
Left second message with patient regarding scheduling hospital follow up appointment and discussing hospitalization.

## 2018-02-11 DIAGNOSIS — I5023 Acute on chronic systolic (congestive) heart failure: Secondary | ICD-10-CM | POA: Insufficient documentation

## 2018-02-11 DIAGNOSIS — I5022 Chronic systolic (congestive) heart failure: Secondary | ICD-10-CM | POA: Insufficient documentation

## 2018-02-12 ENCOUNTER — Encounter: Payer: Medicare Other | Admitting: Physical Therapy

## 2018-02-18 DIAGNOSIS — I4892 Unspecified atrial flutter: Secondary | ICD-10-CM | POA: Diagnosis not present

## 2018-02-18 DIAGNOSIS — E875 Hyperkalemia: Secondary | ICD-10-CM | POA: Diagnosis not present

## 2018-02-18 DIAGNOSIS — R2689 Other abnormalities of gait and mobility: Secondary | ICD-10-CM | POA: Diagnosis not present

## 2018-02-18 DIAGNOSIS — I5031 Acute diastolic (congestive) heart failure: Secondary | ICD-10-CM | POA: Diagnosis not present

## 2018-02-18 DIAGNOSIS — Z23 Encounter for immunization: Secondary | ICD-10-CM | POA: Diagnosis not present

## 2018-02-18 DIAGNOSIS — I825Y3 Chronic embolism and thrombosis of unspecified deep veins of proximal lower extremity, bilateral: Secondary | ICD-10-CM | POA: Diagnosis not present

## 2018-02-18 DIAGNOSIS — Z7982 Long term (current) use of aspirin: Secondary | ICD-10-CM | POA: Diagnosis not present

## 2018-02-18 DIAGNOSIS — K1239 Other oral mucositis (ulcerative): Secondary | ICD-10-CM | POA: Diagnosis not present

## 2018-02-18 DIAGNOSIS — R488 Other symbolic dysfunctions: Secondary | ICD-10-CM | POA: Diagnosis not present

## 2018-02-18 DIAGNOSIS — I770 Arteriovenous fistula, acquired: Secondary | ICD-10-CM | POA: Diagnosis not present

## 2018-02-18 DIAGNOSIS — E876 Hypokalemia: Secondary | ICD-10-CM | POA: Diagnosis not present

## 2018-02-18 DIAGNOSIS — N390 Urinary tract infection, site not specified: Secondary | ICD-10-CM | POA: Diagnosis not present

## 2018-02-18 DIAGNOSIS — I110 Hypertensive heart disease with heart failure: Secondary | ICD-10-CM | POA: Diagnosis not present

## 2018-02-18 DIAGNOSIS — Z8679 Personal history of other diseases of the circulatory system: Secondary | ICD-10-CM | POA: Diagnosis not present

## 2018-02-18 DIAGNOSIS — Z9229 Personal history of other drug therapy: Secondary | ICD-10-CM | POA: Diagnosis not present

## 2018-02-18 DIAGNOSIS — R0602 Shortness of breath: Secondary | ICD-10-CM | POA: Diagnosis not present

## 2018-02-18 DIAGNOSIS — Z95818 Presence of other cardiac implants and grafts: Secondary | ICD-10-CM | POA: Diagnosis not present

## 2018-02-18 DIAGNOSIS — N186 End stage renal disease: Secondary | ICD-10-CM | POA: Diagnosis not present

## 2018-02-18 DIAGNOSIS — Z86711 Personal history of pulmonary embolism: Secondary | ICD-10-CM | POA: Diagnosis not present

## 2018-02-18 DIAGNOSIS — Z79899 Other long term (current) drug therapy: Secondary | ICD-10-CM | POA: Diagnosis not present

## 2018-02-18 DIAGNOSIS — N2581 Secondary hyperparathyroidism of renal origin: Secondary | ICD-10-CM | POA: Diagnosis not present

## 2018-02-18 DIAGNOSIS — K1230 Oral mucositis (ulcerative), unspecified: Secondary | ICD-10-CM | POA: Diagnosis not present

## 2018-02-18 DIAGNOSIS — N179 Acute kidney failure, unspecified: Secondary | ICD-10-CM | POA: Diagnosis not present

## 2018-02-18 DIAGNOSIS — I482 Chronic atrial fibrillation: Secondary | ICD-10-CM | POA: Diagnosis not present

## 2018-02-18 DIAGNOSIS — R2681 Unsteadiness on feet: Secondary | ICD-10-CM | POA: Diagnosis not present

## 2018-02-18 DIAGNOSIS — F329 Major depressive disorder, single episode, unspecified: Secondary | ICD-10-CM | POA: Diagnosis not present

## 2018-02-18 DIAGNOSIS — R809 Proteinuria, unspecified: Secondary | ICD-10-CM | POA: Diagnosis not present

## 2018-02-18 DIAGNOSIS — Z743 Need for continuous supervision: Secondary | ICD-10-CM | POA: Diagnosis not present

## 2018-02-18 DIAGNOSIS — Z940 Kidney transplant status: Secondary | ICD-10-CM | POA: Diagnosis not present

## 2018-02-18 DIAGNOSIS — I129 Hypertensive chronic kidney disease with stage 1 through stage 4 chronic kidney disease, or unspecified chronic kidney disease: Secondary | ICD-10-CM | POA: Diagnosis not present

## 2018-02-18 DIAGNOSIS — Z7901 Long term (current) use of anticoagulants: Secondary | ICD-10-CM | POA: Diagnosis not present

## 2018-02-18 DIAGNOSIS — I5033 Acute on chronic diastolic (congestive) heart failure: Secondary | ICD-10-CM | POA: Diagnosis not present

## 2018-02-18 DIAGNOSIS — K148 Other diseases of tongue: Secondary | ICD-10-CM | POA: Diagnosis not present

## 2018-02-18 DIAGNOSIS — I481 Persistent atrial fibrillation: Secondary | ICD-10-CM | POA: Diagnosis not present

## 2018-02-18 DIAGNOSIS — N182 Chronic kidney disease, stage 2 (mild): Secondary | ICD-10-CM | POA: Diagnosis not present

## 2018-02-18 DIAGNOSIS — N184 Chronic kidney disease, stage 4 (severe): Secondary | ICD-10-CM | POA: Diagnosis not present

## 2018-02-18 DIAGNOSIS — I4891 Unspecified atrial fibrillation: Secondary | ICD-10-CM | POA: Diagnosis not present

## 2018-02-18 DIAGNOSIS — B002 Herpesviral gingivostomatitis and pharyngotonsillitis: Secondary | ICD-10-CM | POA: Diagnosis not present

## 2018-02-18 DIAGNOSIS — R9431 Abnormal electrocardiogram [ECG] [EKG]: Secondary | ICD-10-CM | POA: Diagnosis not present

## 2018-02-18 DIAGNOSIS — N189 Chronic kidney disease, unspecified: Secondary | ICD-10-CM | POA: Diagnosis not present

## 2018-02-18 DIAGNOSIS — N183 Chronic kidney disease, stage 3 (moderate): Secondary | ICD-10-CM | POA: Diagnosis not present

## 2018-02-18 DIAGNOSIS — R262 Difficulty in walking, not elsewhere classified: Secondary | ICD-10-CM | POA: Diagnosis not present

## 2018-02-18 DIAGNOSIS — D631 Anemia in chronic kidney disease: Secondary | ICD-10-CM | POA: Diagnosis not present

## 2018-02-18 DIAGNOSIS — D899 Disorder involving the immune mechanism, unspecified: Secondary | ICD-10-CM | POA: Diagnosis not present

## 2018-02-18 DIAGNOSIS — I480 Paroxysmal atrial fibrillation: Secondary | ICD-10-CM | POA: Diagnosis not present

## 2018-02-18 DIAGNOSIS — B009 Herpesviral infection, unspecified: Secondary | ICD-10-CM | POA: Diagnosis not present

## 2018-02-18 DIAGNOSIS — Z95828 Presence of other vascular implants and grafts: Secondary | ICD-10-CM | POA: Diagnosis not present

## 2018-02-18 DIAGNOSIS — M6281 Muscle weakness (generalized): Secondary | ICD-10-CM | POA: Diagnosis not present

## 2018-02-18 DIAGNOSIS — M109 Gout, unspecified: Secondary | ICD-10-CM | POA: Diagnosis not present

## 2018-02-18 DIAGNOSIS — I120 Hypertensive chronic kidney disease with stage 5 chronic kidney disease or end stage renal disease: Secondary | ICD-10-CM | POA: Diagnosis not present

## 2018-02-18 DIAGNOSIS — Z9181 History of falling: Secondary | ICD-10-CM | POA: Diagnosis not present

## 2018-02-18 MED ORDER — HEPARIN SODIUM (PORCINE) 5000 UNIT/ML IJ SOLN
5000.00 | INTRAMUSCULAR | Status: DC
Start: 2018-02-18 — End: 2018-02-18

## 2018-02-18 MED ORDER — CLOPIDOGREL BISULFATE 75 MG PO TABS
75.00 | ORAL_TABLET | ORAL | Status: DC
Start: 2018-02-19 — End: 2018-02-18

## 2018-02-18 MED ORDER — LOPERAMIDE HCL 2 MG PO CAPS
2.00 | ORAL_CAPSULE | ORAL | Status: DC
Start: ? — End: 2018-02-18

## 2018-02-18 MED ORDER — ACYCLOVIR 400 MG PO TABS
400.00 | ORAL_TABLET | ORAL | Status: DC
Start: 2018-02-18 — End: 2018-02-18

## 2018-02-18 MED ORDER — CHLORHEXIDINE GLUCONATE 0.12 % MT SOLN
15.00 | OROMUCOSAL | Status: DC
Start: 2018-02-18 — End: 2018-02-18

## 2018-02-18 MED ORDER — FUROSEMIDE 40 MG PO TABS
80.00 | ORAL_TABLET | ORAL | Status: DC
Start: 2018-02-19 — End: 2018-02-18

## 2018-02-18 MED ORDER — ALBUTEROL SULFATE HFA 108 (90 BASE) MCG/ACT IN AERS
2.00 | INHALATION_SPRAY | RESPIRATORY_TRACT | Status: DC
Start: ? — End: 2018-02-18

## 2018-02-18 MED ORDER — GENERIC EXTERNAL MEDICATION
30.00 | Status: DC
Start: 2018-02-18 — End: 2018-02-18

## 2018-02-18 MED ORDER — METOPROLOL TARTRATE 25 MG PO TABS
25.00 | ORAL_TABLET | ORAL | Status: DC
Start: 2018-02-18 — End: 2018-02-18

## 2018-02-18 MED ORDER — PREDNISONE 5 MG PO TABS
5.00 | ORAL_TABLET | ORAL | Status: DC
Start: 2018-02-19 — End: 2018-02-18

## 2018-02-18 MED ORDER — NYSTATIN 100000 UNIT/GM EX POWD
CUTANEOUS | Status: DC
Start: 2018-02-18 — End: 2018-02-18

## 2018-02-18 MED ORDER — ACETAMINOPHEN 500 MG PO TABS
500.00 | ORAL_TABLET | ORAL | Status: DC
Start: ? — End: 2018-02-18

## 2018-02-18 MED ORDER — MAGNESIUM OXIDE 400 MG PO TABS
400.00 | ORAL_TABLET | ORAL | Status: DC
Start: 2018-02-18 — End: 2018-02-18

## 2018-02-18 MED ORDER — MELATONIN 3 MG PO TABS
6.00 | ORAL_TABLET | ORAL | Status: DC
Start: 2018-02-18 — End: 2018-02-18

## 2018-02-18 MED ORDER — MYCOPHENOLATE SODIUM 180 MG PO TBEC
180.00 | DELAYED_RELEASE_TABLET | ORAL | Status: DC
Start: 2018-02-18 — End: 2018-02-18

## 2018-02-18 MED ORDER — SERTRALINE HCL 50 MG PO TABS
50.00 | ORAL_TABLET | ORAL | Status: DC
Start: 2018-02-19 — End: 2018-02-18

## 2018-02-18 MED ORDER — ASPIRIN EC 81 MG PO TBEC
81.00 | DELAYED_RELEASE_TABLET | ORAL | Status: DC
Start: 2018-02-19 — End: 2018-02-18

## 2018-02-18 MED ORDER — DOCUSATE SODIUM 100 MG PO CAPS
100.00 | ORAL_CAPSULE | ORAL | Status: DC
Start: ? — End: 2018-02-18

## 2018-02-18 MED ORDER — TACROLIMUS 1 MG PO CAPS
1.00 | ORAL_CAPSULE | ORAL | Status: DC
Start: 2018-02-18 — End: 2018-02-18

## 2018-02-18 MED ORDER — GENERIC EXTERNAL MEDICATION
3.00 | Status: DC
Start: ? — End: 2018-02-18

## 2018-02-19 ENCOUNTER — Non-Acute Institutional Stay (SKILLED_NURSING_FACILITY): Payer: Medicare Other | Admitting: Internal Medicine

## 2018-02-19 ENCOUNTER — Encounter: Payer: Self-pay | Admitting: Internal Medicine

## 2018-02-19 DIAGNOSIS — Z86711 Personal history of pulmonary embolism: Secondary | ICD-10-CM

## 2018-02-19 DIAGNOSIS — I5031 Acute diastolic (congestive) heart failure: Secondary | ICD-10-CM

## 2018-02-19 DIAGNOSIS — K1230 Oral mucositis (ulcerative), unspecified: Secondary | ICD-10-CM | POA: Diagnosis not present

## 2018-02-19 DIAGNOSIS — N189 Chronic kidney disease, unspecified: Secondary | ICD-10-CM

## 2018-02-19 DIAGNOSIS — I482 Chronic atrial fibrillation, unspecified: Secondary | ICD-10-CM

## 2018-02-19 DIAGNOSIS — B002 Herpesviral gingivostomatitis and pharyngotonsillitis: Secondary | ICD-10-CM | POA: Diagnosis not present

## 2018-02-19 DIAGNOSIS — Z940 Kidney transplant status: Secondary | ICD-10-CM | POA: Diagnosis not present

## 2018-02-19 DIAGNOSIS — N179 Acute kidney failure, unspecified: Secondary | ICD-10-CM

## 2018-02-19 DIAGNOSIS — F329 Major depressive disorder, single episode, unspecified: Secondary | ICD-10-CM

## 2018-02-19 DIAGNOSIS — I825Y3 Chronic embolism and thrombosis of unspecified deep veins of proximal lower extremity, bilateral: Secondary | ICD-10-CM | POA: Diagnosis not present

## 2018-02-19 DIAGNOSIS — I4891 Unspecified atrial fibrillation: Secondary | ICD-10-CM

## 2018-02-19 DIAGNOSIS — F32A Depression, unspecified: Secondary | ICD-10-CM

## 2018-02-19 NOTE — Progress Notes (Signed)
:    Location:  [facility] Room Number: [phone] Place of Service:     Noah Delaine. Sheppard Coil, MD  Patient Care Team: Carollee Herter, Alferd Apa, DO as PCP - General Sueanne Margarita, MD as Consulting Physician (Cardiology) Paralee Cancel, MD as Consulting Physician (Orthopedic Surgery) Fleet Contras, MD as Consulting Physician (Nephrology) Obgyn, [facility]  Extended Emergency Contact Information Primary Emergency Contact: Wirsing,Fredrick Address: [address]          [place], [place] [address] Johnnette Litter of [place] Phone: [phone] Mobile Phone: [phone] Relation: Spouse Secondary Emergency Contact: Charlestine Massed, [place] of [employer] Phone: [phone] Relation: Daughter     Allergies: Ace inhibitors; Amiodarone; and Warfarin sodium  Chief Complaint  Patient presents with  . New Admit To SNF    Admit to [facility]    HPI: Patient is 72 y.o. female with end-stage renal disease secondary to hypertension status post renal transplant (07/19/2015, on Myfortic and tach, blindness), DVT/PE status post IVC filter due to [place] while on anticoagulation, atrial fibrillation on home Xarelto status post watchman procedure on 10/04/2017, atrial flutter status post ablation on 07/2015, HFpEF (EF 50-55%) and OSA on CPAP who originally presented to [facility] on 02/03/2018 with 2 weeks of generalized weakness, poor oral intake, diarrhea, and decreased urine output found to be in ventricular tachycardia versus atrial flutter versus atrial fib now status post cardioversion with 120 J with conversion back to sinus rhythm.  Patient was admitted for cardiac monitoring, placed on heparin drip, with acute renal failure secondary to poor p.o. intake/diarrhea with a creatinine of 5 and hyperkalemia.  Patient was transferred to [facility] for worsening creatinine and reduced urine output despite adequate fluid  resuscitation and IV Lasix.  Of note patient was hospitalized at [facility] on 4/30 for sepsis secondary to UTI/AKI.  Patient was admitted to [facility] from 7/24-8/6.  Patient required hemodialysis to improve her hypervolemia after which she continued to have good urine output.  Patient was transitioned to Prograf and levels were checked daily and adjustments to dosage first were made and she is going to be discharged on Prograf 1 mg twice daily.  Patient went into atrial fib with RVR at [facility] during this admission, electrophysiology cardiology was consulted and as the patient had failed several other p.o. antiarrhythmic agents in the past and recommended rate control with p.o. metoprolol.  Patient will be discharged on aspirin and Plavix per EP recommendations as there is no need for anticoagulants patient since she is status post watchman.  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 acyclovir.  Patient is admitted to skilled nursing facility for OT/PT.  While at skilled nursing facility patient will be followed for history of DVT/PE treated with ASA and Plavix, history of kidney transplant treated with Prograf and prednisone and Myfortic and depression treated with Zoloft.  Past Medical History:  Diagnosis Date  . Acute diastolic (congestive) heart failure ([place]) 08/12/2017  . AKI (acute kidney injury) ([place]) 11/04/2017  . Anemia   . Anxiety   . Arthritis    "shoulders; arms; hips" (02/04/2018)  . Asthma   . Atrial fibrillation ([place])   . Atrial flutter ([place])  s/p aflutter ablation at [facility]  . Benign hypertension with ESRD (end-stage renal disease) ([place])   . Blood transfusion    never had a reaction to blood transfusion  . Cardiomyopathy Mar 2013   Mild, EF 50-55% by Mar 2013 ECHO,  diast dysfxn II  . Cardiomyopathy ([place])   . CHF (congestive heart failure) ([place]) 2005  . CHF (congestive heart failure) ([place])   . Childhood asthma   . Closed fracture of right distal radius 10/18/2016  . CMV (cytomegalovirus infection) ([place]) 10/03/2015  . Constipation    takes Miralax daily  . Constipation   . Depression    takes Zoloft daily  . Distal radius fracture, right   . DVT of lower extremity, bilateral ([place]) 12/21/11   "they're there now; been there for 2 wks"  . Eczema   . ESRD (end stage renal disease) on dialysis [facility]) 09/2011   07-19-2015 had Kidney transplant at [facility]; "don't get dialysis anymore" (02/04/2018)  . Fractures, stress    in both feet--6 OR 7 YRS AGO--RESOLVED  . Generalized edema [id number]  . Gout    doesn't require meds   . HCAP (healthcare-associated pneumonia) 09/10/2011  . Hearing difficulty   . High cholesterol   . History of CVA (cerebrovascular accident) without residual deficits   . History of hip replacement, total, right   . History of pneumonia   . Hx of Clostridium difficile infection   . Hypercalcemia    09/10/11  . Hyperparathyroidism ([place]) 04/07/2013  . Hypertension    takes Diltiazem daily   . Hypomagnesemia 08/02/2015  . Hypophosphatemia 11/08/2017  . ICB (intracranial bleed) ([place])   . Immunosuppression ([place]) 07/25/2015  . Memory changes   . Morbid obesity ([place])   . Nonischemic dilated cardiomyopathy ([place])   . Obesity 04/07/2013  . Oligouria   . OSA on CPAP   . PAF (paroxysmal atrial fibrillation) (H[facility])     HX OF CEREBRAL BLEED WHILE ON COUMADIN-SO PT NOT ON ANY BLOOD THINNERS NOW  . Peripheral vascular disease ([place])   . Presence of Watchman left atrial appendage closure device 10/04/2017  . Proteinuria 09/04/2016  . Psoriasis 08/07/2016  . 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 S/P insertion of IVC (inferior vena caval) filter   . Sepsis ([place]) 11/08/2017  . Stress fracture    bilateral feet  . Stroke [facility]) 2009   denies  residual on 02/04/2018;  hemorrhagic now off coumadin  . Stroke due to intracerebral hemorrhage ([place]) 2009  . Suture reaction 09/04/2016  . Transfusion history   . Use of cane as ambulatory aid     Past Surgical History:  Procedure Laterality Date  . AV FISTULA PLACEMENT  09/28/2011   Procedure: ARTERIOVENOUS (AV) FISTULA CREATION;  Surgeon: Rosetta Posner, MD;  Location: [facility];  Service: Vascular;  Laterality: Left;  . CARDIAC CATHETERIZATION    . CARDIAC VALVE SURGERY  1960    FOR ATRIAL SEPTAL DEFECT  . CHOLECYSTECTOMY     1980's  . COLONOSCOPY    . CYSTOSCOPY     many yrs ago  . FEMUR FRACTURE SURGERY Right 03/2011; 09/03/2011   "had 2, 2 wk apart in 2012; broke it again 08/2011 & had OR"  . FRACTURE SURGERY    . I&D EXTREMITY  09/15/2011   Procedure: IRRIGATION AND DEBRIDEMENT EXTREMITY w REMOVAL OF HARDWARE;  Surgeon: [name], MD;  Location: [facility];  Service: Orthopedics;  Laterality: Right;  . INCISION AND DRAINAGE HIP  12/21/2011  Procedure: IRRIGATION AND DEBRIDEMENT HIP;  Surgeon: [name], MD;  Location: [facility];  Service: Orthopedics;  Laterality: Right;  I&D RIGHT HIP WITH PLACEMENT ANTIBIOTIC SPACER  . INSERTION OF DIALYSIS CATHETER     Procedure: INSERTION OF DIALYSIS CATHETER;  Surgeon: Rosetta Posner, MD;  Location: [facility];  Service: Vascular;  Laterality: Right;  . IRRIGATION AND DEBRIDEMENT ABSCESS  12/21/11   right hip  . JOINT REPLACEMENT    . KIDNEY TRANSPLANT  07/19/15  . LAPAROSCOPIC GASTRIC BANDING  2008  . LEFT ATRIAL APPENDAGE OCCLUSION  10/04/2017  . OPEN REDUCTION INTERNAL FIXATION (ORIF) DISTAL RADIAL FRACTURE Right 10/18/2016   Procedure: OPEN REDUCTION INTERNAL FIXATION (ORIF) RIGHT DISTAL RADIAL FRACTURE;  Surgeon: Marchia Bond, MD;  Location: [facility];  Service: Orthopedics;  Laterality: Right;  . TOTAL HIP ARTHROPLASTY Right 02/2011   right THA 02/2011, I&D/removal of hardware 09/2011,, repeat I&D Jun 2013, reimplantation R THA 03-26-2012   . TOTAL HIP REVISION  03/25/2012   Procedure: TOTAL HIP REVISION;  Surgeon: [name], MD;  Location: [facility];  Service: Orthopedics;  Laterality: Right;  Right Total Hip Reimplantation  . TUBAL LIGATION    . VENA CAVA FILTER PLACEMENT  11/2011    Allergies as of 02/19/2018      Reactions   Ace Inhibitors Other (See Comments)   Reaction unknown   Amiodarone Other (See Comments)   Pt with Restrictive Lung Disease by 10/2017 PFT with decreased DLCO   Warfarin Sodium Other (See Comments)   Caused her to have a stroke      Medication List        Accurate as of 02/19/18 11:03 AM. Always use your most recent med list.          acetaminophen 500 MG tablet Commonly known as:  TYLENOL Take 500 mg by mouth every 6 (six) hours as needed.   albuterol 108 (90 Base) MCG/ACT inhaler Commonly known as:  PROVENTIL HFA;VENTOLIN HFA Inhale 2 puffs into the lungs every 6 (six) hours as needed for wheezing or shortness of breath.   aspirin EC 81 MG tablet Take 81 mg by mouth.   docusate sodium 100 MG capsule Commonly known as:  COLACE Take 100 mg by mouth daily as needed (For constipation.).   ENVARSUS XR 1 MG Tb24 Generic drug:  Tacrolimus Take 2 mg by mouth daily.   furosemide 80 MG tablet Commonly known as:  LASIX Take 80 mg by mouth 2 (two) times daily.   Magnesium 400 MG Tabs Take by mouth 2 (two) times daily.   metoprolol tartrate 25 MG tablet Commonly known as:  LOPRESSOR Take 25 mg by mouth 4 (four) times daily.   MOUTH WASH-GP PO Take by mouth. MUCOSITIS MOUTH WASH-Give 79mls by mouth 4 times daily for 10 days for oral mucositis   MYFORTIC 180 MG EC tablet Generic drug:  mycophenolate Take 180 mg by mouth 2 (two) times daily.   NONFORMULARY OR COMPOUNDED ITEM Compression stockings --thigh high  20-30 mm hg Dx  Low ext edema   PLAVIX 75 MG tablet Generic drug:  clopidogrel Take 75 mg by mouth daily.   potassium chloride SA 20 MEQ tablet Commonly known as:   K-DUR,KLOR-CON Take 20 mEq by mouth.   predniSONE 5 MG tablet Commonly known as:  DELTASONE Take 5 mg by mouth daily with breakfast.   sertraline 50 MG tablet Commonly known as:  ZOLOFT Take 1 tablet (50 mg total) by mouth  daily.       No orders of the defined types were placed in this encounter.   Immunization History  Administered Date(s) Administered  . Influenza Split 03/27/2012  . Influenza Whole 04/29/2008  . Influenza, High Dose Seasonal PF 04/19/2017  . Influenza,inj,Quad PF,6+ Mos 04/24/2013  . Influenza-Unspecified 04/29/2014, 03/17/2015, 04/19/2016  . Pneumococcal Conjugate-13 03/02/2014  . Pneumococcal Polysaccharide-23 04/25/2005, 09/18/2016  . Tdap 05/08/2013    Social History   Tobacco Use  . Smoking status: Never Smoker  . Smokeless tobacco: Never Used  Substance Use Topics  . Alcohol use: Never    Frequency: Never    Family history is   Family History  Problem Relation Age of Onset  . Cancer Father   . Diabetes Mother   . Hypertension Mother   . Arthritis Mother   . Hodgkin's lymphoma Unknown 32       dscd---HODGKINS DISEASE  . Hypertension Brother       Review of Systems  DATA OBTAINED: from patient, nurse GENERAL:  no fevers, fatigue, appetite changes SKIN: No itching, or rash EYES: No eye pain, redness, discharge EARS: No earache, tinnitus, change in hearing NOSE: No congestion, drainage or bleeding  MOUTH/THROAT: No mouth or tooth pain, No sore throat RESPIRATORY: No cough, wheezing, SOB CARDIAC: No chest pain, palpitations, lower extremity edema  GI: No abdominal pain, No N/V/D or constipation, No heartburn or reflux  GU: No dysuria, frequency or urgency, or incontinence  MUSCULOSKELETAL: No unrelieved bone/joint pain NEUROLOGIC: No headache, dizziness or focal weakness PSYCHIATRIC: No c/o anxiety or sadness   Vitals:   02/19/18 1020  BP: 123/82  Pulse: 81  Resp: 18  Temp: (!) 97.2 F (36.2 C)  SpO2: 96%    SpO2  Readings from Last 1 Encounters:  02/19/18 96%   Body mass index is 36.94 kg/m.     Physical Exam  GENERAL APPEARANCE: Alert, conversant,  No acute distress.  SKIN: No diaphoresis rash HEAD: Normocephalic, atraumatic  EYES: Conjunctiva/lids clear. Pupils round, reactive. EOMs intact.  EARS: External exam WNL, canals clear. Hearing grossly normal.  NOSE: No deformity or discharge.  MOUTH/THROAT: Lips w/o lesions  RESPIRATORY: Breathing is even, unlabored. Lung sounds are clear   CARDIOVASCULAR: Heart RRR no murmurs, rubs or gallops. No peripheral edema.   GASTROINTESTINAL: Abdomen is soft, non-tender, not distended w/ normal bowel sounds. GENITOURINARY: Bladder non tender, not distended  MUSCULOSKELETAL: No abnormal joints or musculature NEUROLOGIC:  Cranial nerves 2-12 grossly intact. Moves all extremities  PSYCHIATRIC: Mood and affect appropriate to situation, no behavioral issues  Patient Active Problem List   Diagnosis Date Noted  . Hyperkalemia 02/03/2018  . Atrial fibrillation with RVR ([place]) 02/03/2018  . Abscess of right leg 12/14/2017  . Lower extremity edema 12/10/2017  . Acute kidney injury superimposed on chronic kidney disease ([place]) 11/17/2017  . Acute diastolic (congestive) heart failure ([place]) 11/17/2017  . UTI (urinary tract infection) 11/03/2017  . Diarrhea 11/03/2017  . Septic shock ([place]) 11/03/2017  . Chronic respiratory failure with hypoxia ([place]) 10/03/2017  . Dyspnea 07/26/2017  . Atrial flutter ([place])   . Peripheral vascular disease ([place])   . Oligouria   . Hypertension   . Fractures, stress   . Eczema   . Distal radius fracture, right   . Depression   . Constipation   . Arthritis   . Exacerbation of asthma 04/01/2017  . Closed fracture of right distal radius 11-01-16  . Deceased-donor kidney transplant recipient 09/13/2015  . Nonischemic  dilated cardiomyopathy ([place])   . Skin ulceration ([place]) 05/09/2013  . Abscess of shoulder 05/15/2012  .  Expected blood loss anemia 03/26/2012  . S/P right TH revision 03/25/2012  . Abscess of hip, right 12/24/2011  . DVT of lower extremity, bilateral ([place]) 12/21/2011  . History of DVT (deep vein thrombosis) 12/08/2011  . OSA on CPAP 12/08/2011  . End stage renal disease ([place]) 11/06/2011  . 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[place]) 10/24/2011  . Pseudomonas aeruginosa infection 10/24/2011  . ESRD (end stage renal disease) on dialysis ([place]) 09/14/2011  . Hypercalcemia   . Community acquired pneumonia of left upper lobe of lung ([place]) 09/10/2011  . Gout 09/10/2011  . Absence of right hip joint with antibiotic pacer 09/03/2011  . Traumatic seroma of thigh ([place]) 06/11/2011  . Clostridium difficile colitis 06/09/2011  . Leukocytosis 06/08/2011  . Anemia 06/06/2011  . ARF (acute renal failure) ([place]) 06/06/2011  . SINUSITIS- ACUTE-NOS 09/27/2010  . DEPRESSION 11/25/2008  . PAF (paroxysmal atrial fibrillation) ([place]) 11/25/2008  . RENAL FAILURE, ACUTE 11/25/2008  . CVA 09/28/2008  . 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 WEAKNESS, LEFT SIDE OF BODY 09/21/2008  . Morbid obesity ([place]) 10/20/2007  . Stroke due to intracerebral hemorrhage ([place]) 07/17/2007  . Stroke ([place]) 07/17/2007  . Essential hypertension 07/07/2007  . Asthma 07/07/2007      Labs reviewed: Basic Metabolic Panel:    Component Value Date/Time   NA 136 02/05/2018 0002   K 4.8 02/05/2018 0002   CL 99 02/05/2018 0002   CO2 24 02/05/2018 0002   GLUCOSE 89 02/05/2018 0002   BUN 46 (H) 02/05/2018 0002   CREATININE 5.27 (H) 02/05/2018 0002   CALCIUM 9.1 02/05/2018 0002   CALCIUM 11.1 (H) 09/11/2011 0825   PROT 7.1 02/03/2018 1447   ALBUMIN 3.4 (L) 02/05/2018 0002   AST 76 (H) 02/03/2018 1447   ALT 57 (H) 02/03/2018 1447   ALKPHOS 74 02/03/2018 1447   BILITOT 3.4 (H) 02/03/2018 1447   GFRNONAA 8 (L) 02/05/2018 0002   GFRAA 9 (L) 02/05/2018 0002    Recent Labs    02/03/18 1447 02/03/18 1458  02/04/18 0611 02/05/18 0002  NA 135 132* 139  139 136  K 5.6* 5.4* 4.7  4.7 4.8  CL 92* 97* 102  102 99  CO2 18*  --  23  23 24   GLUCOSE 37* 37* 86  88 89  BUN 39* 42* 39*  39* 46*  CREATININE 5.00* 5.00* 4.71*  4.65* 5.27*  CALCIUM 10.8*  --  8.7*  8.6* 9.1  MG 2.1  --   --   --   PHOS  --   --  5.0* 5.1*   Liver Function Tests: Recent Labs    11/03/17 1805 02/03/18 1447 02/04/18 0611 02/05/18 0002  AST 17 76*  --   --   ALT 20 57*  --   --   ALKPHOS 81 74  --   --   BILITOT 1.4* 3.4*  --   --   PROT 6.4* 7.1  --   --   ALBUMIN 2.6* 4.1 3.3* 3.4*   Recent Labs    11/03/17 1805  LIPASE 30   No results for input(s): AMMONIA in the last 8760 hours. CBC: Recent Labs    02/03/18 1400  02/03/18 1458 02/04/18 0611 02/05/18 0002  WBC 13.0*  --   --  10.9* 12.7*  HGB 14.4   < > 18.4* 12.3 12.4  HCT 47.9*   < >  54.0* 39.3 39.9  MCV 87.1  --   --  83.4 83.8  PLT 258  --   --  199 194   < > = values in this interval not displayed.   Lipid No results for input(s): CHOL, HDL, LDLCALC, TRIG in the last 8760 hours.  Cardiac Enzymes: Recent Labs    11/03/17 2118 02/04/18 0611  TROPONINI 0.09* 0.38*   BNP: Recent Labs    11/03/17 1805  BNP 525.4*   No results found for: [facility] Lab Results  Component Value Date   HGBA1C  11/16/2008    5.5 (NOTE) The ADA recommends the following therapeutic goal for glycemic control related to Hgb A1c measurement: Goal of therapy: <6.5 Hgb A1c  Reference: American Diabetes Association: Clinical Practice Recommendations 2010, Diabetes Care, 2010, 33: (Suppl  1).   Lab Results  Component Value Date   TSH 2.655 09/11/2011   Lab Results  Component Value Date   ZOXWRUEA54 098 09/11/2011   Lab Results  Component Value Date   FOLATE 6.0 09/11/2011   Lab Results  Component Value Date   IRON 68 09/18/2011   TIBC 144 (L) 09/18/2011   FERRITIN 263 09/18/2011    Imaging and Procedures obtained prior to SNF  admission: US Renal Transplant W/doppler  Result Date: 02/03/2018 CLINICAL DATA:  Acute renal failure. EXAM: ULTRASOUND OF RENAL TRANSPLANT WITH RENAL DOPPLER ULTRASOUND TECHNIQUE: Ultrasound examination of the renal transplant was performed with gray-scale, color and duplex doppler evaluation. Technologist describes technically difficult study with limited vascular evaluation attributed to body habitus and overlying bowel gas. COMPARISON:  None. FINDINGS: Transplant kidney location: Right lower quadrant Transplant Kidney: Length: 11 cm. Normal in size and parenchymal echogenicity. No evidence of mass or hydronephrosis. No peri-transplant fluid collection seen. Color flow in the main renal artery:  Limited evaluation Color flow in the main renal vein:  Limited evaluation Duplex Doppler Evaluation: Main Renal Artery Resistive Index: Not obtained Venous waveform in main renal vein:  Not demonstrated Intrarenal resistive index in upper pole:  Not obtained (normal 0.6-0.8; equivocal 0.8-0.9; abnormal >= 0.9) Intrarenal resistive index in lower pole: Not obtained (normal 0.6-0.8; equivocal 0.8-0.9; abnormal >= 0.9) Bladder: Normal for degree of bladder distention. Unilateral ureteral jet documented. Other findings: There is a 8.9 x 5.5 x 6.3 cm deep subcutaneous fluid collection superior and lateral to the transplant kidney without significant mass effect upon the kidney or collecting system. IMPRESSION: 1. No hydronephrosis of transplant kidney. Limited vascular evaluation. 2. 8.9 cm deep subcutaneous fluid collection superior and lateral to the transplant kidney without significant mass effect on the kidney. Electronically Signed   By: Lucrezia Europe M.D.   On: 02/03/2018 21:52   Dg Chest Portable 1 View  Result Date: 02/03/2018 CLINICAL DATA:  Chest pain. Shortness of breath. Generalized weakness. EXAM: PORTABLE CHEST 1 VIEW COMPARISON:  Two-view chest x-ray 11/03/2017. FINDINGS: Heart is enlarged. Defibrillator  pad is in place. Lung volumes are low. Mild pulmonary vascular congestion is present. No focal airspace disease is evident. IMPRESSION: 1. Cardiomegaly and mild pulmonary vascular congestion without frank edema. Early congestive heart failure is not excluded. 2. No focal airspace disease. Electronically Signed   By: San Morelle M.D.   On: 02/03/2018 16:16     Not all labs, radiology exams or other studies done during hospitalization come through on my EPIC note; however they are reviewed by me.    Assessment and Plan  Acute kidney injury of deceased donor kidney transplant/hypokalemia/hypomagnesemia- patient's  primary nephrologist is Dr. [facility] nephrology practice.  Patient's creatinine baseline is somewhere between 1.6 and 2.0.  Patient had presented to [facility] with a creatinine of about 5.  The etiology of her acute kidney injury was felt to be multifactorial including supratherapeutic tacrolimus (greater than 30 on arrival), cardiorenal syndrome secondary to volume overload with resultant ATN.  On admission to [facility] patient was aggressively dosed with IV Lasix with minimal urine output put and tacrolimus was held.  She eventually did go into atrial fibrillar RVR and given diuretic resistance hypervolemia a Vas-Cath was placed and patient underwent dialysis from 7/27-7/30 with substantial improvement in hypervolemia.  At this point patient was be able to discharge to the floor and had good urine output with p.o. Lasix patient was started on Prograf, levels were checked daily and adjustments were made to dose and patient ended up with Prograf 1 mg twice daily.  She will need to have Prograf levels checked on Thursday 8/8 for a trough with results faxed to Dr. Desma Maxim.  She is continued on home prednisone 5 mg daily and Myfortic 180 mg twice daily SNF -admitted for OT/PT; continue Prograf 1 mg twice daily with level checked on 8/8 with results to Dr.  Keturah Barre; continue on home prednisone 5 mg daily and Myfortic 180 mg twice daily.  Prograf troughs will need to be rechecked again in 8 weeks from 8/ 8 as well.  Patient will also have BMP, mag, and Foss levels checked and these were repleted in the hospital and patient is being discharged on potassium 20 mEq daily and mag oxide 400 mg twice daily  Acute on chronic congestive heart failure- EF 45 to 50%; resolved after several days of ultrafiltration on CRRT from 7/27-7/30.  After that patient responded to p.o. Lasix 80 mg daily.  Weight on day of discharge is 192 pounds SNF -will be following weights; continue Lasix 80 mg daily and metoprolol 25 mg 4 times daily  Atrial flutter with RVR/chronic paroxysmal atrial fibrillation status post ablation and watchman 09/2017- patient went into A. fib with RVR during this admission; due to renal function propafenone was contraindicated.  Electrophysiology cardiology was consulted and as patient had failed other anti-arrhythmic agents in the past I recommend rate control with p.o. metoprolol.  This was uptitrated with good controlled.  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  And mucositis mouthwash 30 mils by mouth 4 times daily for 10 days  Status post kidney transplant SNF -continue Prograf 1 mg twice daily, prednisone 5 mg daily and Myfortic 180 mg twice daily  Depression SNF -continue Zoloft 50 mg daily; considered controlled  History DVT/PE SNF -continue ASA 81 mg and Plavix 75 mg as  prophylaxis   Time spent greater than 45 minutes;> 50% of time with patient was spent reviewing records, labs, tests and studies, counseling and developing plan of care  Webb Silversmith D. Sheppard Coil, MD

## 2018-02-20 LAB — BASIC METABOLIC PANEL
BUN: 26 — AB (ref 4–21)
Creatinine: 1.9 — AB (ref 0.5–1.1)
Glucose: 86
Potassium: 3.4 (ref 3.4–5.3)
Sodium: 142 (ref 137–147)

## 2018-02-22 ENCOUNTER — Encounter: Payer: Self-pay | Admitting: Internal Medicine

## 2018-02-22 DIAGNOSIS — I482 Chronic atrial fibrillation, unspecified: Secondary | ICD-10-CM | POA: Insufficient documentation

## 2018-02-22 DIAGNOSIS — B002 Herpesviral gingivostomatitis and pharyngotonsillitis: Secondary | ICD-10-CM | POA: Insufficient documentation

## 2018-02-22 DIAGNOSIS — K1230 Oral mucositis (ulcerative), unspecified: Secondary | ICD-10-CM

## 2018-02-22 DIAGNOSIS — Z86711 Personal history of pulmonary embolism: Secondary | ICD-10-CM | POA: Insufficient documentation

## 2018-02-22 HISTORY — DX: Oral mucositis (ulcerative), unspecified: K12.30

## 2018-03-03 DIAGNOSIS — Z95818 Presence of other cardiac implants and grafts: Secondary | ICD-10-CM | POA: Diagnosis not present

## 2018-03-03 DIAGNOSIS — Z95828 Presence of other vascular implants and grafts: Secondary | ICD-10-CM | POA: Diagnosis not present

## 2018-03-03 DIAGNOSIS — N186 End stage renal disease: Secondary | ICD-10-CM | POA: Diagnosis not present

## 2018-03-03 DIAGNOSIS — I4891 Unspecified atrial fibrillation: Secondary | ICD-10-CM | POA: Diagnosis not present

## 2018-03-03 DIAGNOSIS — Z8679 Personal history of other diseases of the circulatory system: Secondary | ICD-10-CM | POA: Diagnosis not present

## 2018-03-03 DIAGNOSIS — Z7901 Long term (current) use of anticoagulants: Secondary | ICD-10-CM | POA: Diagnosis not present

## 2018-03-03 DIAGNOSIS — Z7982 Long term (current) use of aspirin: Secondary | ICD-10-CM | POA: Diagnosis not present

## 2018-03-03 DIAGNOSIS — I120 Hypertensive chronic kidney disease with stage 5 chronic kidney disease or end stage renal disease: Secondary | ICD-10-CM | POA: Diagnosis not present

## 2018-03-03 DIAGNOSIS — I481 Persistent atrial fibrillation: Secondary | ICD-10-CM | POA: Diagnosis not present

## 2018-03-03 DIAGNOSIS — R9431 Abnormal electrocardiogram [ECG] [EKG]: Secondary | ICD-10-CM | POA: Diagnosis not present

## 2018-03-03 DIAGNOSIS — Z940 Kidney transplant status: Secondary | ICD-10-CM | POA: Diagnosis not present

## 2018-03-03 DIAGNOSIS — Z79899 Other long term (current) drug therapy: Secondary | ICD-10-CM | POA: Diagnosis not present

## 2018-03-04 DIAGNOSIS — N183 Chronic kidney disease, stage 3 (moderate): Secondary | ICD-10-CM | POA: Diagnosis not present

## 2018-03-04 DIAGNOSIS — M109 Gout, unspecified: Secondary | ICD-10-CM | POA: Diagnosis not present

## 2018-03-04 DIAGNOSIS — I129 Hypertensive chronic kidney disease with stage 1 through stage 4 chronic kidney disease, or unspecified chronic kidney disease: Secondary | ICD-10-CM | POA: Diagnosis not present

## 2018-03-04 DIAGNOSIS — D631 Anemia in chronic kidney disease: Secondary | ICD-10-CM | POA: Diagnosis not present

## 2018-03-04 DIAGNOSIS — R809 Proteinuria, unspecified: Secondary | ICD-10-CM | POA: Diagnosis not present

## 2018-03-04 DIAGNOSIS — D899 Disorder involving the immune mechanism, unspecified: Secondary | ICD-10-CM | POA: Diagnosis not present

## 2018-03-04 DIAGNOSIS — I770 Arteriovenous fistula, acquired: Secondary | ICD-10-CM | POA: Diagnosis not present

## 2018-03-04 DIAGNOSIS — Z79899 Other long term (current) drug therapy: Secondary | ICD-10-CM | POA: Diagnosis not present

## 2018-03-04 DIAGNOSIS — N2581 Secondary hyperparathyroidism of renal origin: Secondary | ICD-10-CM | POA: Diagnosis not present

## 2018-03-04 DIAGNOSIS — N179 Acute kidney failure, unspecified: Secondary | ICD-10-CM | POA: Diagnosis not present

## 2018-03-04 DIAGNOSIS — Z940 Kidney transplant status: Secondary | ICD-10-CM | POA: Diagnosis not present

## 2018-03-05 LAB — CBC AND DIFFERENTIAL
HCT: 37 (ref 36–46)
Hemoglobin: 11.8 — AB (ref 12.0–16.0)
Platelets: 223 (ref 150–399)
WBC: 8.2

## 2018-03-05 LAB — BASIC METABOLIC PANEL
BUN: 9 (ref 4–21)
Creatinine: 2.3 — AB (ref 0.5–1.1)
Glucose: 81
Potassium: 4.6 (ref 3.4–5.3)
Sodium: 137 (ref 137–147)

## 2018-03-07 ENCOUNTER — Encounter: Payer: Self-pay | Admitting: Primary Care

## 2018-03-07 ENCOUNTER — Ambulatory Visit (INDEPENDENT_AMBULATORY_CARE_PROVIDER_SITE_OTHER): Payer: Medicare Other | Admitting: Primary Care

## 2018-03-07 ENCOUNTER — Other Ambulatory Visit: Payer: Self-pay

## 2018-03-07 DIAGNOSIS — R0602 Shortness of breath: Secondary | ICD-10-CM

## 2018-03-07 DIAGNOSIS — I480 Paroxysmal atrial fibrillation: Secondary | ICD-10-CM | POA: Diagnosis not present

## 2018-03-07 DIAGNOSIS — Z940 Kidney transplant status: Secondary | ICD-10-CM

## 2018-03-07 DIAGNOSIS — Z9229 Personal history of other drug therapy: Secondary | ICD-10-CM

## 2018-03-07 DIAGNOSIS — I5031 Acute diastolic (congestive) heart failure: Secondary | ICD-10-CM | POA: Diagnosis not present

## 2018-03-07 MED ORDER — BUDESONIDE-FORMOTEROL FUMARATE 80-4.5 MCG/ACT IN AERO: 2.0000 | INHALATION_SPRAY | Freq: Two times a day (BID) | RESPIRATORY_TRACT | 0 refills | Status: DC

## 2018-03-07 NOTE — Patient Instructions (Addendum)
Restart Symbicort 80- 2 puffs BID Albuterol rescue inhaler as needed- 2 puffs q6hrs prn wheeze  Boost/ensure twice daily  Needs HRCT for May 2020  FU with Dr. Elsworth Soho in 3 months

## 2018-03-07 NOTE — Assessment & Plan Note (Signed)
Recent hospitalization from 7/22-8/6, requiring CRRT  Cr 2.3 - 8/21 Has apt with nephrology on September 3rd

## 2018-03-07 NOTE — Assessment & Plan Note (Signed)
Continues myfortic and tacrolimus Started on prograft 1gm (goal level 6-8)

## 2018-03-07 NOTE — Assessment & Plan Note (Signed)
Continues lasix 80mg  daily

## 2018-03-07 NOTE — Assessment & Plan Note (Addendum)
Resume Symbicort, step down therapy to 27mcg- 2 puffs BID Needs HRCT in 12 months (May 2020) FU in 3 months with Dr. Elsworth Soho

## 2018-03-07 NOTE — Assessment & Plan Note (Addendum)
Irregular rhythm, HR 100 Amiodarone has been discontinued  Started on Metroprolol 25mg  QID Follows with [facility], seen on 03/03/18 Plan PPM and AV node ablation in mid-September

## 2018-03-07 NOTE — Assessment & Plan Note (Signed)
HRCT 11/2017-mild patchy subpleural reticulation and groundglass bilateral lungs without significant traction bronchiectasis or frank honeycombing.  Findings nonspecific may represent mild postinfectious/inflammatory changes.  Possibility of ILD such as NSIP or early UIP cannot be excluded but is not favored.  Recommend follow-up HRCT in 12 months (May 2020).

## 2018-03-07 NOTE — Progress Notes (Signed)
@Patient  ID: [name], female    DOB: [birth year], 72 y.o.   MRN: [id number]  Chief Complaint  Patient presents with  . Follow-up    this AM sluggish and SOB    Referring provider: [name], *  HPI:  72 year old female history of end-stage renal disease status post deceased donor kidney transplant in 2017 (myfortic and tacrolimus), chronic proximal A. fib/flutter status post ablation in 2017 and watchman in March 2019, congestive heart failure, history of DVT/PE status post IVC filter.  Patient of Dr. Elsworth Soho, last seen in May 2019.  Since last pulmonary clinic visit patient had hospitalization at [facility] from July 24 to August 6 for ARF.  Hospital course: Patient presented to [facility] on 7/22 with generalized weakness and decreased urine output, patient was found to have acute renal failure.  Creatinine at that time was 5.0.  This was felt to be due to supratherapeutic tacrolimus level and volume overload.  Attempted to diurese and patient went from sinus rhythm to tachy-arrhythmia.  Cardioversion was performed and patient returned to normal sinus rhythm.  She was given high dose of Lasix without improvement and transferred to [facility] nephrology inpatient on 7/24. Vas-Cath was placed and patient was initiated on CRRT in ICU from 7/27-7/30.  Creatinine improved to near baseline 1.8-2.1.  Vas-Cath was removed on 8/5.  Patient was transitioned Prograf 1 g twice daily (Goal level 6-8).  Volume status improved on CRRT.  Initiated on p.o. Lasix and discharged on 80 mg daily weight on day of discharge was 192 pounds. Regarding patient's proximal A. fib EP was consulted, recommend rate control with p.o. metoprolol 25 mg 4 times a day.   Patient saw cardiology/ [facility] 4 days ago they recommend permanent pacemaker implant and AV node ablation this is scheduled for mid September. Scheduled for TEE to image L AAO in mid-September as  well. Remains on DAPT. Her Nephrologist is Dr. Jimmy Footman in [place]. Creatinine on 08/21 was 2.3 (1.9). K 4.6. She has follow-up apt on September 3rd.    03/07/2018 Patient presents today for 24-month follow-up. Feels her breathing is terrible. Complains of sob, no wheezing. Has trouble getting deep breath. Symbicort 160 was discontinued on discharge. Unclear if there was a particular reason for this. Per Dr. Angus Palms note he recommended stepping down therapy after 3 months as he's not convinced she has true asthma. She feels very strongly that her Symbicort inhaler improved her breathing. Complains of lack of energy. Labs two days ago, Cr 2.3. Next follow up with nephrology September 3rd. Still taking lasix 80mg  daily. Urinating ok. Weight 201 (husband states weight on arrival to nursing facility scale was 198 lb). Patient is up 3 lbs. Getting PT, OT and speech therapy. Appetite hasn't been great.    Significant tests/ events reviewed Spirometry 09/2017 significant restriction with FVC 25%, FEV1 28%.  Ratio 87.  Decreased mid flows. PFTs 11/2017-moderate restriction, FVC 53%, TLC 55%, DLCO 43%, no obstruction HRCT 11/2017-mild patchy subpleural reticulation and groundglass bilateral lungs without significant traction bronchiectasis or frank honeycombing.  Findings nonspecific may represent mild postinfectious/inflammatory changes.  Possibility of ILD such as NSIP or early UIP cannot be excluded but is not favored.  Recommend follow-up HRCT in 12 months (May 2020).     Allergies  Allergen Reactions  . Ace Inhibitors Other (See Comments)    Reaction unknown  . 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 Warfarin Sodium Other (See Comments)    Caused her to have a stroke    Immunization History  Administered Date(s) Administered  . Influenza Split 03/27/2012  . Influenza Whole 04/29/2008  . Influenza, High Dose Seasonal PF 04/19/2017    . Influenza,inj,Quad PF,6+ Mos 04/24/2013  . Influenza-Unspecified 04/29/2014, 03/17/2015, 04/19/2016  . Pneumococcal Conjugate-13 03/02/2014  . Pneumococcal Polysaccharide-23 04/25/2005, 09/18/2016  . Tdap 05/08/2013    Past Medical History:  Diagnosis Date  . Acute diastolic (congestive) heart failure ([place]) 08/12/2017  . AKI (acute kidney injury) ([place]) 11/04/2017  . Anemia   . Anxiety   . Arthritis    "shoulders; arms; hips" (02/04/2018)  . Asthma   . Atrial fibrillation ([place])   . Atrial flutter [facility])    s/p aflutter ablation at [facility]  . Benign hypertension with ESRD (end-stage renal disease) ([place])   . Blood transfusion    never had a reaction to blood transfusion  . Cardiomyopathy Mar 2013   Mild, EF 50-55% by Mar 2013 ECHO, diast dysfxn II  . Cardiomyopathy ([place])   . CHF (congestive heart failure) ([place]) 2005  . CHF (congestive heart failure) ([place])   . Childhood asthma   . Closed fracture of right distal radius 10/18/2016  . CMV (cytomegalovirus infection) ([place]) 10/03/2015  . Constipation    takes Miralax daily  . Constipation   . Depression    takes Zoloft daily  . Distal radius fracture, right   . DVT of lower extremity, bilateral ([place]) 12/21/11   "they're there now; been there for 2 wks"  . Eczema   . ESRD (end stage renal disease) on dialysis [facility]) 09/2011   07-19-2015 had Kidney transplant at [facility]; "don't get dialysis anymore" (02/04/2018)  . Fractures, stress    in both feet--6 OR 7 YRS AGO--RESOLVED  . Generalized edema [id number]  . Gout    doesn't require meds   . HCAP (healthcare-associated pneumonia) 09/10/2011  . Hearing difficulty   . High cholesterol   . History of CVA (cerebrovascular accident) without residual deficits   . History of hip replacement, total, right   . History of pneumonia   . Hx of Clostridium difficile infection   . Hypercalcemia    09/10/11  . Hyperparathyroidism ([place]) 04/07/2013  . Hypertension    takes Diltiazem daily   .  Hypomagnesemia 08/02/2015  . Hypophosphatemia 11/08/2017  . ICB (intracranial bleed) ()   . Immunosuppression ([place]) 07/25/2015  . Memory changes   . Morbid obesity ([place])   . Nonischemic dilated cardiomyopathy ([place])   . Obesity 04/07/2013  . Oligouria   . OSA on CPAP   . PAF (paroxysmal atrial fibrillation) ([facility])     HX OF CEREBRAL BLEED WHILE ON COUMADIN-SO PT NOT ON ANY BLOOD THINNERS NOW  . Peripheral vascular disease ([place])   . Presence of Watchman left atrial appendage closure device 10/04/2017  . Proteinuria 09/04/2016  . Psoriasis 08/07/2016  . 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 S/P insertion of IVC (inferior vena caval) filter   . Sepsis ([place]) 11/08/2017  . Stress fracture    bilateral feet  . Stroke [facility]) 2009   denies residual on 02/04/2018;  hemorrhagic now off coumadin  . Stroke due to intracerebral hemorrhage ([place]) 2009  . Suture reaction 09/04/2016  . Transfusion history   . Use of cane as ambulatory aid     Tobacco History: Social History   Tobacco Use  Smoking  Status Never Smoker  Smokeless Tobacco Never Used   Counseling given: Not Answered   Outpatient Medications Prior to Visit  Medication Sig Dispense Refill  . acetaminophen (TYLENOL) 500 MG tablet Take 500 mg by mouth every 6 (six) hours as needed.    Marland Kitchen albuterol (PROVENTIL HFA;VENTOLIN HFA) 108 (90 Base) MCG/ACT inhaler Inhale 2 puffs into the lungs every 6 (six) hours as needed for wheezing or shortness of breath. 18 g 5  . aspirin EC 81 MG tablet Take 81 mg by mouth.    . clopidogrel (PLAVIX) 75 MG tablet Take 75 mg by mouth daily.    Marland Kitchen docusate sodium (COLACE) 100 MG capsule Take 100 mg by mouth daily as needed (For constipation.).     Marland Kitchen furosemide (LASIX) 80 MG tablet Take 80 mg by mouth 2 (two) times daily.    . Magnesium 400 MG TABS Take by mouth 2 (two) times daily.    . metoprolol tartrate (LOPRESSOR) 25 MG tablet Take 25 mg by mouth 4 (four) times daily.    . mycophenolate  (MYFORTIC) 180 MG EC tablet Take 180 mg by mouth 2 (two) times daily.    . potassium chloride SA (K-DUR,KLOR-CON) 20 MEQ tablet Take 20 mEq by mouth.     . predniSONE (DELTASONE) 5 MG tablet Take 5 mg by mouth daily with breakfast.    . sertraline (ZOLOFT) 50 MG tablet Take 1 tablet (50 mg total) by mouth daily. 90 tablet 3  . tacrolimus (PROGRAF) 1 MG capsule Take 2 mg by mouth daily.    Earley Abide Compounding Base (MOUTH WASH-GP PO) Take by mouth. MUCOSITIS MOUTH WASH-Give 34mls by mouth 4 times daily for 10 days for oral mucositis    . NONFORMULARY OR COMPOUNDED ITEM Compression stockings --thigh high  20-30 mm hg Dx  Low ext edema (Patient not taking: Reported on 03/07/2018) 1 each 0  . Tacrolimus (ENVARSUS XR) 1 MG TB24 Take 2 mg by mouth daily.      No facility-administered medications prior to visit.      Review of Systems  Review of Systems  Constitutional: Positive for fatigue.  HENT: Negative.   Respiratory: Positive for shortness of breath. Negative for cough, chest tightness, wheezing and stridor.   Cardiovascular: Positive for leg swelling. Negative for chest pain and palpitations.  Genitourinary: Negative for decreased urine volume.     Physical Exam  BP 130/80 (BP Location: Right Arm, Cuff Size: Normal)   Pulse 96   Ht 5\' 5"  (1.651 m)   Wt 201 lb (91.2 kg)   SpO2 95%   BMI 33.45 kg/m  Physical Exam  Constitutional: She is oriented to person, place, and time. She appears well-developed and well-nourished. No distress.  Neck: Normal range of motion. Neck supple.  Cardiovascular: Normal heart sounds.  Irregular, HR 100 2-3 BLE edema   Pulmonary/Chest: Effort normal. No stridor. No respiratory distress. She has no wheezes. She has rales.  LS mostly clear, crackles left base   Musculoskeletal: Normal range of motion.  In [facility]   Neurological: She is alert and oriented to person, place, and time.     Lab Results:  CBC    Component Value Date/Time   WBC 8.2  03/05/2018   WBC 12.7 (H) 02/05/2018 0002   RBC 4.76 02/05/2018 0002   HGB 11.8 (A) 03/05/2018   HCT 37 03/05/2018   PLT 223 03/05/2018   MCV 83.8 02/05/2018 0002   MCH 26.1 02/05/2018 0002   MCHC 31.1  02/05/2018 0002   RDW 20.6 (H) 02/05/2018 0002   LYMPHSABS 2.4 01/22/2012 1202   MONOABS 1.4 (H) 01/22/2012 1202   EOSABS 0.1 01/22/2012 1202   BASOSABS 0.1 01/22/2012 1202    BMET    Component Value Date/Time   NA 137 03/05/2018   K 4.6 03/05/2018   CL 99 02/05/2018 0002   CO2 24 02/05/2018 0002   GLUCOSE 89 02/05/2018 0002   BUN 9 03/05/2018   CREATININE 2.3 (A) 03/05/2018   CREATININE 5.27 (H) 02/05/2018 0002   CALCIUM 9.1 02/05/2018 0002   CALCIUM 11.1 (H) 09/11/2011 0825   GFRNONAA 8 (L) 02/05/2018 0002   GFRAA 9 (L) 02/05/2018 0002    BNP    Component Value Date/Time   BNP 525.4 (H) 11/03/2017 1805    ProBNP No results found for: PROBNP  Imaging: No results found.   Assessment & Plan:   PAF (paroxysmal atrial fibrillation) (HCC) Irregular rhythm, HR 100 Amiodarone has been discontinued  Started on Metroprolol 25mg  QID Follows with [facility], seen on 03/03/18 Plan PPM and AV node ablation in mid-September  Asthma Resume Symbicort, step down therapy to 79mcg- 2 puffs BID Needs HRCT in 12 months (May 2020) FU in 3 months with Dr. Elsworth Soho   ARF (acute renal failure) [facility]) Recent hospitalization from 7/22-8/6, requiring CRRT  Cr 2.3 - 8/21 Has apt with nephrology on 2023-03-31   Deceased-donor kidney transplant recipient Continues myfortic and tacrolimus Started on prograft 1gm (goal level 6-8)  Acute diastolic (congestive) heart failure (HCC) Continues lasix 80mg  daily   History of amiodarone therapy HRCT 11/2017-mild patchy subpleural reticulation and groundglass bilateral lungs without significant traction bronchiectasis or frank honeycombing.  Findings nonspecific may represent mild postinfectious/inflammatory changes.   Possibility of ILD such as NSIP or early UIP cannot be excluded but is not favored.  Recommend follow-up HRCT in 12 months (May 2020).       Martyn Ehrich, NP 03/07/2018

## 2018-03-10 DIAGNOSIS — K148 Other diseases of tongue: Secondary | ICD-10-CM | POA: Diagnosis not present

## 2018-03-10 DIAGNOSIS — B009 Herpesviral infection, unspecified: Secondary | ICD-10-CM | POA: Diagnosis not present

## 2018-03-14 ENCOUNTER — Encounter: Payer: Self-pay | Admitting: Internal Medicine

## 2018-03-14 ENCOUNTER — Non-Acute Institutional Stay (SKILLED_NURSING_FACILITY): Payer: Medicare Other | Admitting: Internal Medicine

## 2018-03-14 DIAGNOSIS — Z940 Kidney transplant status: Secondary | ICD-10-CM

## 2018-03-14 DIAGNOSIS — E875 Hyperkalemia: Secondary | ICD-10-CM | POA: Diagnosis not present

## 2018-03-14 DIAGNOSIS — N189 Chronic kidney disease, unspecified: Secondary | ICD-10-CM

## 2018-03-14 DIAGNOSIS — N179 Acute kidney failure, unspecified: Secondary | ICD-10-CM

## 2018-03-14 DIAGNOSIS — K1230 Oral mucositis (ulcerative), unspecified: Secondary | ICD-10-CM

## 2018-03-14 DIAGNOSIS — Z86711 Personal history of pulmonary embolism: Secondary | ICD-10-CM | POA: Diagnosis not present

## 2018-03-14 DIAGNOSIS — I5031 Acute diastolic (congestive) heart failure: Secondary | ICD-10-CM

## 2018-03-14 DIAGNOSIS — F32A Depression, unspecified: Secondary | ICD-10-CM

## 2018-03-14 DIAGNOSIS — I825Y3 Chronic embolism and thrombosis of unspecified deep veins of proximal lower extremity, bilateral: Secondary | ICD-10-CM | POA: Diagnosis not present

## 2018-03-14 DIAGNOSIS — I4891 Unspecified atrial fibrillation: Secondary | ICD-10-CM

## 2018-03-14 DIAGNOSIS — B002 Herpesviral gingivostomatitis and pharyngotonsillitis: Secondary | ICD-10-CM

## 2018-03-14 DIAGNOSIS — F329 Major depressive disorder, single episode, unspecified: Secondary | ICD-10-CM

## 2018-03-14 NOTE — Progress Notes (Signed)
Location:  [facility] Room Number: 614E Place of Service:  SNF (31)  [name]. Sheppard Coil, MD  Patient Care Team: Carollee Herter, Alferd Apa, DO as PCP - General Sueanne Margarita, MD as Consulting Physician (Cardiology) Paralee Cancel, MD as Consulting Physician (Orthopedic Surgery) Fleet Contras, MD as Consulting Physician (Nephrology) Obgyn, [facility]  Extended Emergency Contact Information Primary Emergency Contact: Temkin,Fredrick Address: [address]          [place], [place] [address] [place] of [place] Phone: [phone] Mobile Phone: [phone] Relation: Spouse Secondary Emergency Contact: Charlestine Massed, [place] [employer] Phone: [phone] Relation: Daughter  Allergies  Allergen Reactions  . Ace Inhibitors Other (See Comments)    Reaction unknown  . 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 Warfarin Sodium Other (See Comments)    Caused her to have a stroke    Chief Complaint  Patient presents with  . Discharge Note    Discharge from [facility]    HPI:  72 y.o. female with end-stage renal disease secondary to hypertension status post renal transplant, DVT/PE status post IVC filter due to [place] while on anticoagulation, atrial fibrillation on home Xarelto status post watchman's procedure on 10/04/2017, atrial flutter status post ablation on 07/2015, HF PEF (EF 50 to 55%) and OSA on CPAP who originally presented to [facility] on 02/03/2018 with 2 weeks of generalized weakness, poor oral intake, diarrhea, and decreased urine output found to be in V. tach versus atrial flutter versus atrial fib now status post conversion with 120 J with conversion back to sinus rhythm.  Patient was admitted for cardiac monitoring, placed on heparin drip and had acute renal failure secondary to poor p.o. intake and diarrhea with a creatinine of 5  and hyperkalemia.  Patient was transferred to [facility] for worsening creatinine and reduced urine output despite adequate fluid resuscitation and IV Lasix.  Of note, patient was hospitalized at [facility] on 4/30 for sepsis secondary to UTI/AKI.  Patient was admitted no: About his hospital from 7/24-8/6.  Patient required hemodialysis to improve her hypervolemia after which she continued to have good urine output patient was transitioned to Prograf and levels were checked daily and adjustments to doses first were made and she will be discharged on Prograf 1 mg twice daily.  Patient went into atrial fibrillar RVR at [facility] during this admission, EP was consulted and as the patient had failed several other p.o. antiarrhythmics in the past metoprolol was recommended for rate control.  Patient will be discharged on aspirin and Plavix per EP recommendations as there is no need for anticoagulants since patient is status post watchman.  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  Patient was admitted to skilled nursing facility for OT/PT and is now ready toibe discharged home.    Past Medical History:  Diagnosis Date  . Acute diastolic (congestive) heart failure ([place]) 08/12/2017  . AKI (acute kidney injury) ([place]) 11/04/2017  . Anemia   . Anxiety   . Arthritis    "shoulders; arms; hips" (02/04/2018)  . Asthma   . Atrial fibrillation ([place])   . Atrial flutter [facility])    s/p aflutter ablation at [facility]  . Benign hypertension with ESRD (end-stage renal disease) (  [place])   . Blood transfusion    never had a reaction to blood transfusion  . Cardiomyopathy Mar 2013   Mild, EF 50-55% by Mar 2013 ECHO, diast dysfxn II  . Cardiomyopathy ([place])   . CHF (congestive heart failure) ([place]) 2005  . CHF (congestive heart failure) ([place])   . Childhood asthma   . Closed fracture of right distal radius 10/18/2016  .  CMV (cytomegalovirus infection) ([place]) 10/03/2015  . Constipation    takes Miralax daily  . Constipation   . Depression    takes Zoloft daily  . Distal radius fracture, right   . DVT of lower extremity, bilateral ([place]) 12/21/11   "they're there now; been there for 2 wks"  . Eczema   . ESRD (end stage renal disease) on dialysis [facility]) 09/2011   07-19-2015 had Kidney transplant at [facility]; "don't get dialysis anymore" (02/04/2018)  . Fractures, stress    in both feet--6 OR 7 YRS AGO--RESOLVED  . Generalized edema [id number]  . Gout    doesn't require meds   . HCAP (healthcare-associated pneumonia) 09/10/2011  . Hearing difficulty   . High cholesterol   . History of CVA (cerebrovascular accident) without residual deficits   . History of hip replacement, total, right   . History of pneumonia   . Hx of Clostridium difficile infection   . Hypercalcemia    09/10/11  . Hyperparathyroidism ([place]) 04/07/2013  . Hypertension    takes Diltiazem daily   . Hypomagnesemia 08/02/2015  . Hypophosphatemia 11/08/2017  . ICB (intracranial bleed) ([place])   . Immunosuppression ([place]) 07/25/2015  . Memory changes   . Morbid obesity ([place])   . Nonischemic dilated cardiomyopathy ([place])   . Obesity 04/07/2013  . Oligouria   . OSA on CPAP   . PAF (paroxysmal atrial fibrillation) (H[facility])     HX OF CEREBRAL BLEED WHILE ON COUMADIN-SO PT NOT ON ANY BLOOD THINNERS NOW  . Peripheral vascular disease ([place])   . Presence of Watchman left atrial appendage closure device 10/04/2017  . Proteinuria 09/04/2016  . Psoriasis 08/07/2016  . 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 S/P insertion of IVC (inferior vena caval) filter   . Sepsis ([place]) 11/08/2017  . Stress fracture    bilateral feet  . Stroke [facility]) 2009   denies residual on 02/04/2018;  hemorrhagic now off coumadin  . Stroke due to intracerebral hemorrhage ([place]) 2009  . Suture reaction 09/04/2016  . Transfusion history   . Use of cane as ambulatory aid     Past  Surgical History:  Procedure Laterality Date  . AV FISTULA PLACEMENT  09/28/2011   Procedure: ARTERIOVENOUS (AV) FISTULA CREATION;  Surgeon: Rosetta Posner, MD;  Location: [place];  Service: Vascular;  Laterality: Left;  . CARDIAC CATHETERIZATION    . CARDIAC VALVE SURGERY  1960    FOR ATRIAL SEPTAL DEFECT  . CHOLECYSTECTOMY     1980's  . COLONOSCOPY    . CYSTOSCOPY     many yrs ago  . FEMUR FRACTURE SURGERY Right 03/2011; 09/03/2011   "had 2, 2 wk apart in 2012; broke it again 08/2011 & had OR"  . FRACTURE SURGERY    . I&D EXTREMITY  09/15/2011   Procedure: IRRIGATION AND DEBRIDEMENT EXTREMITY w REMOVAL OF HARDWARE;  Surgeon: [name], MD;  Location: [place];  Service: Orthopedics;  Laterality: Right;  . INCISION AND DRAINAGE HIP  12/21/2011   Procedure: IRRIGATION AND DEBRIDEMENT HIP;  Surgeon: [name], MD;  Location: [place];  Service: Orthopedics;  Laterality: Right;  I&D RIGHT HIP WITH PLACEMENT ANTIBIOTIC SPACER  . INSERTION OF DIALYSIS CATHETER     Procedure: INSERTION OF DIALYSIS CATHETER;  Surgeon: Rosetta Posner, MD;  Location: [place];  Service: Vascular;  Laterality: Right;  . IRRIGATION AND DEBRIDEMENT ABSCESS  12/21/11   right hip  . JOINT REPLACEMENT    . KIDNEY TRANSPLANT  07/19/15  . LAPAROSCOPIC GASTRIC BANDING  2008  . LEFT ATRIAL APPENDAGE OCCLUSION  10/04/2017  . OPEN REDUCTION INTERNAL FIXATION (ORIF) DISTAL RADIAL FRACTURE Right 10/18/2016   Procedure: OPEN REDUCTION INTERNAL FIXATION (ORIF) RIGHT DISTAL RADIAL FRACTURE;  Surgeon: Marchia Bond, MD;  Location: [place];  Service: Orthopedics;  Laterality: Right;  . TOTAL HIP ARTHROPLASTY Right 02/2011   right THA 02/2011, I&D/removal of hardware 09/2011,, repeat I&D Jun 2013, reimplantation R THA 03-26-2012  . TOTAL HIP REVISION  03/25/2012   Procedure: TOTAL HIP REVISION;  Surgeon: [name], MD;  Location: [facility];  Service: Orthopedics;  Laterality: Right;  Right Total Hip Reimplantation  . TUBAL  LIGATION    . VENA CAVA FILTER PLACEMENT  11/2011     reports that she has never smoked. She has never used smokeless tobacco. She reports that she does not drink alcohol or use drugs. Social History   Socioeconomic History  . Marital status: Married    Spouse name: Not on file  . Number of children: Not on file  . Years of education: Not on file  . Highest education level: Not on file  Occupational History  . Occupation: retired  Scientific laboratory technician  . Financial resource strain: Not on file  . Food insecurity:    Worry: Not on file    Inability: Not on file  . Transportation needs:    Medical: Not on file    Non-medical: Not on file  Tobacco Use  . Smoking status: Never Smoker  . Smokeless tobacco: Never Used  Substance and Sexual Activity  . Alcohol use: Never    Frequency: Never  . Drug use: Never  . Sexual activity: Yes    Birth control/protection: Post-menopausal  Lifestyle  . Physical activity:    Days per week: Not on file    Minutes per session: Not on file  . Stress: Not on file  Relationships  . Social connections:    Talks on phone: Not on file    Gets together: Not on file    Attends religious service: Not on file    Active member of club or organization: Not on file    Attends meetings of clubs or organizations: Not on file    Relationship status: Not on file  . Intimate partner violence:    Fear of current or ex partner: Not on file    Emotionally abused: Not on file    Physically abused: Not on file    Forced sexual activity: Not on file  Other Topics Concern  . Not on file  Social History Narrative   Married; retired; lives in [place] Maintenance Due  Topic Date Due  . COLONOSCOPY  07/24/2017  . INFLUENZA VACCINE  02/13/2018  . MAMMOGRAM  05/15/2019  . DEXA SCAN  Completed  . PNA vac Low Risk Adult  Completed    Medications: Allergies as of 03/14/2018      Reactions   Ace Inhibitors Other (See Comments)   Reaction  unknown   Amiodarone Other (  See Comments)   Pt with Restrictive Lung Disease by 10/2017 PFT with decreased DLCO   Warfarin Sodium Other (See Comments)   Caused her to have a stroke      Medication List        Accurate as of 03/14/18 11:59 PM. Always use your most recent med list.          acetaminophen 500 MG tablet Commonly known as:  TYLENOL Take 500 mg by mouth every 6 (six) hours as needed.   albuterol 108 (90 Base) MCG/ACT inhaler Commonly known as:  PROVENTIL HFA;VENTOLIN HFA Inhale 2 puffs into the lungs every 6 (six) hours as needed for wheezing or shortness of breath.   aspirin EC 81 MG tablet Take 81 mg by mouth.   budesonide-formoterol 80-4.5 MCG/ACT inhaler Commonly known as:  SYMBICORT Inhale 2 puffs into the lungs 2 (two) times daily.   docusate sodium 100 MG capsule Commonly known as:  COLACE Take 100 mg by mouth daily as needed (For constipation.).   furosemide 80 MG tablet Commonly known as:  LASIX Take 80 mg by mouth 2 (two) times daily.   Magnesium 400 MG Tabs Take by mouth 2 (two) times daily.   metoprolol tartrate 25 MG tablet Commonly known as:  LOPRESSOR Take 25 mg by mouth. TAKE 1 TABLET BY MOUTH 4 TIMES A DAY FOR AFIB   MOUTH WASH-GP PO Take by mouth. MUCOSITIS MOUTH WASH-Give 56mls by mouth 4 times daily for 10 days for oral mucositis   MYFORTIC 180 MG EC tablet Generic drug:  mycophenolate Take 180 mg by mouth 2 (two) times daily.   NONFORMULARY OR COMPOUNDED ITEM Compression stockings --thigh high  20-30 mm hg Dx  Low ext edema   PLAVIX 75 MG tablet Generic drug:  clopidogrel Take 75 mg by mouth daily.   potassium chloride SA 20 MEQ tablet Commonly known as:  K-DUR,KLOR-CON Take 20 mEq by mouth.   predniSONE 5 MG tablet Commonly known as:  DELTASONE Take 5 mg by mouth daily with breakfast.   sertraline 50 MG tablet Commonly known as:  ZOLOFT Take 1 tablet (50 mg total) by mouth daily.   tacrolimus 1 MG  capsule Commonly known as:  PROGRAF Take 2 mg by mouth daily. 2 mg po q am and 1 mg po q HS        Vitals:   03/14/18 1001  BP: 128/84  Pulse: 98  Resp: 18  Temp: 98.1 F (36.7 C)  Weight: 201 lb (91.2 kg)  Height: 5\' 5"  (1.651 m)   Body mass index is 33.45 kg/m.  Physical Exam  GENERAL APPEARANCE: Alert, conversant. No acute distress.  HEENT: Unremarkable. RESPIRATORY: Breathing is even, unlabored. Lung sounds are clear   CARDIOVASCULAR: Heart RRR no murmurs, rubs or gallops. No peripheral edema.  GASTROINTESTINAL: Abdomen is soft, non-tender, not distended w/ normal bowel sounds.  NEUROLOGIC: Cranial nerves 2-12 grossly intact. Moves all extremities   Labs reviewed: Basic Metabolic Panel: Recent Labs    02/03/18 1447 02/03/18 1458 02/04/18 0611 02/05/18 0002 02/20/18 03/05/18 03/19/18  NA 135 132* 139  139 136 142 137 143  K 5.6* 5.4* 4.7  4.7 4.8 3.4 4.6 2.9*  CL 92* 97* 102  102 99  --   --   --   CO2 18*  --  23  23 24   --   --   --   GLUCOSE 37* 37* 86  88 89  --   --   --  BUN 39* 42* 39*  39* 46* 26* 9 15  CREATININE 5.00* 5.00* 4.71*  4.65* 5.27* 1.9* 2.3* 1.7*  CALCIUM 10.8*  --  8.7*  8.6* 9.1  --   --   --   MG 2.1  --   --   --   --   --   --   PHOS  --   --  5.0* 5.1*  --   --   --    No results found for: [facility] Liver Function Tests: Recent Labs    11/03/17 1805 02/03/18 1447 02/04/18 0611 02/05/18 0002  AST 17 76*  --   --   ALT 20 57*  --   --   ALKPHOS 81 74  --   --   BILITOT 1.4* 3.4*  --   --   PROT 6.4* 7.1  --   --   ALBUMIN 2.6* 4.1 3.3* 3.4*   Recent Labs    11/03/17 1805  LIPASE 30   No results for input(s): AMMONIA in the last 8760 hours. CBC: Recent Labs    02/03/18 1400  02/04/18 0611 02/05/18 0002 03/05/18 03/19/18  WBC 13.0*  --  10.9* 12.7* 8.2 9.8  HGB 14.4   < > 12.3 12.4 11.8* 11.7*  HCT 47.9*   < > 39.3 39.9 37 36  MCV 87.1  --  83.4 83.8  --   --   PLT 258  --  199 194 223 156   < > =  values in this interval not displayed.   Lipid No results for input(s): CHOL, HDL, LDLCALC, TRIG in the last 8760 hours. Cardiac Enzymes: Recent Labs    11/03/17 2118 02/04/18 0611  TROPONINI 0.09* 0.38*   BNP: Recent Labs    11/03/17 1805  BNP 525.4*   CBG: Recent Labs    02/03/18 1439 02/03/18 1506 02/03/18 1537  GLUCAP 15* 82 154*    Procedures and Imaging Studies During Stay: No results found.  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  Social services at facility was responsible for arranging this appointment.  Pt was provided with a 30 day supply of prescriptions for medications and refills must be obtained from their PCP.  For controlled substances, a more limited supply may be provided adequate until PCP appointment only.  Medications have been reconciled.  Time spent greater than 30 minutes;> 50% of time with patient was spent reviewing records, labs, tests and studies, counseling and developing plan of care  [name]. Sheppard Coil, MD

## 2018-03-18 ENCOUNTER — Encounter: Payer: Self-pay | Admitting: Internal Medicine

## 2018-03-18 DIAGNOSIS — I129 Hypertensive chronic kidney disease with stage 1 through stage 4 chronic kidney disease, or unspecified chronic kidney disease: Secondary | ICD-10-CM | POA: Diagnosis not present

## 2018-03-18 DIAGNOSIS — R809 Proteinuria, unspecified: Secondary | ICD-10-CM | POA: Diagnosis not present

## 2018-03-18 DIAGNOSIS — Z79899 Other long term (current) drug therapy: Secondary | ICD-10-CM | POA: Diagnosis not present

## 2018-03-18 DIAGNOSIS — N2581 Secondary hyperparathyroidism of renal origin: Secondary | ICD-10-CM | POA: Diagnosis not present

## 2018-03-18 DIAGNOSIS — D631 Anemia in chronic kidney disease: Secondary | ICD-10-CM | POA: Diagnosis not present

## 2018-03-18 DIAGNOSIS — D899 Disorder involving the immune mechanism, unspecified: Secondary | ICD-10-CM | POA: Diagnosis not present

## 2018-03-18 DIAGNOSIS — Z23 Encounter for immunization: Secondary | ICD-10-CM | POA: Diagnosis not present

## 2018-03-18 DIAGNOSIS — N183 Chronic kidney disease, stage 3 (moderate): Secondary | ICD-10-CM | POA: Diagnosis not present

## 2018-03-18 DIAGNOSIS — Z940 Kidney transplant status: Secondary | ICD-10-CM | POA: Diagnosis not present

## 2018-03-18 DIAGNOSIS — N179 Acute kidney failure, unspecified: Secondary | ICD-10-CM | POA: Diagnosis not present

## 2018-03-18 DIAGNOSIS — M109 Gout, unspecified: Secondary | ICD-10-CM | POA: Diagnosis not present

## 2018-03-19 LAB — BASIC METABOLIC PANEL
BUN: 15 (ref 4–21)
Creatinine: 1.7 — AB (ref 0.5–1.1)
Glucose: 75
Potassium: 2.9 — AB (ref 3.4–5.3)
Sodium: 143 (ref 137–147)

## 2018-03-19 LAB — CBC AND DIFFERENTIAL
HCT: 36 (ref 36–46)
Hemoglobin: 11.7 — AB (ref 12.0–16.0)
Platelets: 156 (ref 150–399)
WBC: 9.8

## 2018-03-20 ENCOUNTER — Ambulatory Visit: Payer: Medicare Other | Admitting: Family Medicine

## 2018-03-22 ENCOUNTER — Encounter: Payer: Self-pay | Admitting: Internal Medicine

## 2018-03-24 ENCOUNTER — Ambulatory Visit (INDEPENDENT_AMBULATORY_CARE_PROVIDER_SITE_OTHER): Payer: Medicare Other | Admitting: Family Medicine

## 2018-03-24 ENCOUNTER — Encounter: Payer: Self-pay | Admitting: Family Medicine

## 2018-03-24 VITALS — BP 131/79 | HR 133 | Temp 98.4°F | Resp 16 | Ht 65.0 in | Wt 194.2 lb

## 2018-03-24 DIAGNOSIS — I480 Paroxysmal atrial fibrillation: Secondary | ICD-10-CM | POA: Diagnosis not present

## 2018-03-24 DIAGNOSIS — I509 Heart failure, unspecified: Secondary | ICD-10-CM | POA: Diagnosis not present

## 2018-03-24 DIAGNOSIS — I5031 Acute diastolic (congestive) heart failure: Secondary | ICD-10-CM | POA: Diagnosis not present

## 2018-03-24 DIAGNOSIS — N189 Chronic kidney disease, unspecified: Secondary | ICD-10-CM | POA: Diagnosis not present

## 2018-03-24 DIAGNOSIS — Z86711 Personal history of pulmonary embolism: Secondary | ICD-10-CM

## 2018-03-24 DIAGNOSIS — I4891 Unspecified atrial fibrillation: Secondary | ICD-10-CM

## 2018-03-24 DIAGNOSIS — Z992 Dependence on renal dialysis: Secondary | ICD-10-CM | POA: Diagnosis not present

## 2018-03-24 DIAGNOSIS — N186 End stage renal disease: Secondary | ICD-10-CM

## 2018-03-24 DIAGNOSIS — Z8673 Personal history of transient ischemic attack (TIA), and cerebral infarction without residual deficits: Secondary | ICD-10-CM

## 2018-03-24 DIAGNOSIS — N179 Acute kidney failure, unspecified: Secondary | ICD-10-CM

## 2018-03-24 DIAGNOSIS — I619 Nontraumatic intracerebral hemorrhage, unspecified: Secondary | ICD-10-CM

## 2018-03-24 NOTE — Assessment & Plan Note (Signed)
Pacemaker to be placed 04/2018

## 2018-03-24 NOTE — Progress Notes (Signed)
Patient ID: [name], female    DOB: 12/06/1945  Age: 72 y.o. MRN: [id number]    Subjective:  Subjective  HPI [name] presents for f/u acute renal failure admission and rehab.  Hx chronic a fib--status post ablation 1 2017 and watchman 09/2017, chf hx dvt/ pe, s/p ivc filter -- she was transferred to [facility] with generalized weakness, dec urine output and ARF of her transplant and hypervolemia.  Pt was diuresed.   She was d/c on prograf bid-- and f/u nephrology Pt will was put on po metoprolol Pt having a pacemaker placed next month at [facility] Review of Systems  Constitutional: Positive for fatigue. Negative for fever.  HENT: Negative for congestion.   Respiratory: Negative for shortness of breath.   Cardiovascular: Negative for chest pain, palpitations and leg swelling.  Gastrointestinal: Negative for abdominal pain, blood in stool and nausea.  Genitourinary: Negative for dysuria and frequency.  Skin: Negative for rash.  Allergic/Immunologic: Negative for environmental allergies.  Neurological: Positive for weakness. Negative for dizziness and headaches.  Psychiatric/Behavioral: The patient is not nervous/anxious.     History Past Medical History:  Diagnosis Date  . Acute diastolic (congestive) heart failure ([place]) 08/12/2017  . AKI (acute kidney injury) ([place]) 11/04/2017  . Anemia   . Anxiety   . Arthritis    "shoulders; arms; hips" (02/04/2018)  . Asthma   . Atrial fibrillation ([place])   . Atrial flutter [facility])    s/p aflutter ablation at [facility]  . Benign hypertension with ESRD (end-stage renal disease) ([place])   . Blood transfusion    never had a reaction to blood transfusion  . Cardiomyopathy Mar 2013   Mild, EF 50-55% by Mar 2013 ECHO, diast dysfxn II  . Cardiomyopathy ([place])   . CHF (congestive heart failure) ([place]) 2005  . CHF (congestive heart failure) ([place])   . Childhood asthma   . Closed fracture of right distal radius 10/18/2016  . CMV (cytomegalovirus  infection) ([place]) 10/03/2015  . Constipation    takes Miralax daily  . Constipation   . Depression    takes Zoloft daily  . Distal radius fracture, right   . DVT of lower extremity, bilateral ([place]) 12/21/11   "they're there now; been there for 2 wks"  . Eczema   . ESRD (end stage renal disease) on dialysis [facility]) 09/2011   07-19-2015 had Kidney transplant at [facility]; "don't get dialysis anymore" (02/04/2018)  . Fractures, stress    in both feet--6 OR 7 YRS AGO--RESOLVED  . Generalized edema [id number]  . Gout    doesn't require meds   . HCAP (healthcare-associated pneumonia) 09/10/2011  . Hearing difficulty   . High cholesterol   . History of CVA (cerebrovascular accident) without residual deficits   . History of hip replacement, total, right   . History of pneumonia   . Hx of Clostridium difficile infection   . Hypercalcemia    09/10/11  . Hyperparathyroidism ([place]) 04/07/2013  . Hypertension    takes Diltiazem daily   . Hypomagnesemia 08/02/2015  . Hypophosphatemia 11/08/2017  . ICB (intracranial bleed) ([place])   . Immunosuppression ([place]) 07/25/2015  . Memory changes   . Morbid obesity ([place])   . Nonischemic dilated cardiomyopathy ([place])   . Obesity 04/07/2013  . Oligouria   . OSA on CPAP   . PAF (paroxysmal atrial fibrillation) (H[facility])     HX OF CEREBRAL BLEED WHILE ON COUMADIN-SO PT NOT ON ANY BLOOD THINNERS NOW  . Peripheral  vascular disease ([place])   . Presence of Watchman left atrial appendage closure device 10/04/2017  . Proteinuria 09/04/2016  . Psoriasis 08/07/2016  . 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 S/P insertion of IVC (inferior vena caval) filter   . Sepsis ([place]) 11/08/2017  . Stress fracture    bilateral feet  . Stroke [facility]) 2009   denies residual on 02/04/2018;  hemorrhagic now off coumadin  . Stroke due to intracerebral hemorrhage ([place]) 2009  . Suture reaction 09/04/2016  . Transfusion history   . Use of cane as ambulatory aid     She has a past surgical  history that includes Cardiac valuve replacement (1960); I&D extremity (09/15/2011); AV fistula placement (09/28/2011); Cystoscopy; Colonoscopy; Irrigation and debridement abscess (12/21/11); Vena cava filter placement (11/2011); Total hip arthroplasty (Right, 02/2011); Femur fracture surgery (Right, 03/2011; 09/03/2011); Insertion of dialysis catheter; Cholecystectomy; Incision and drainage hip (12/21/2011); Total hip revision (03/25/2012); Kidney transplant (07/19/15); Joint replacement; Laparoscopic gastric banding (2008); Open reduction internal fixation (orif) distal radial fracture (Right, 10/18/2016); Fracture surgery; Tubal ligation; Cardiac catheterization; and Left Atrial Appendage Occlusion (10/04/2017).   Her family history includes Arthritis in her mother; Cancer in her father; Diabetes in her mother; Hodgkin's lymphoma (age of onset: 65) in her unknown relative; Hypertension in her brother and mother.She reports that she has never smoked. She has never used smokeless tobacco. She reports that she does not drink alcohol or use drugs.  Current Outpatient Medications on File Prior to Visit  Medication Sig Dispense Refill  . acetaminophen (TYLENOL) 500 MG tablet Take 500 mg by mouth every 6 (six) hours as needed.    Marland Kitchen albuterol (PROVENTIL HFA;VENTOLIN HFA) 108 (90 Base) MCG/ACT inhaler Inhale 2 puffs into the lungs every 6 (six) hours as needed for wheezing or shortness of breath. 18 g 5  . aspirin EC 81 MG tablet Take 81 mg by mouth.    . budesonide-formoterol (SYMBICORT) 80-4.5 MCG/ACT inhaler Inhale 2 puffs into the lungs 2 (two) times daily. 1 Inhaler 0  . clopidogrel (PLAVIX) 75 MG tablet Take 75 mg by mouth daily.    Marland Kitchen docusate sodium (COLACE) 100 MG capsule Take 100 mg by mouth daily as needed (For constipation.).     Marland Kitchen furosemide (LASIX) 80 MG tablet Take 80 mg by mouth 2 (two) times daily.    . Magnesium 400 MG TABS Take by mouth 2 (two) times daily.    . metoprolol tartrate (LOPRESSOR) 25 MG  tablet Take 25 mg by mouth. TAKE 1 TABLET BY MOUTH 4 TIMES A DAY FOR AFIB    . Mouthwash Compounding Base (MOUTH WASH-GP PO) Take by mouth. MUCOSITIS MOUTH WASH-Give 43mls by mouth 4 times daily for 10 days for oral mucositis    . mycophenolate (MYFORTIC) 180 MG EC tablet Take 180 mg by mouth 2 (two) times daily.    . NONFORMULARY OR COMPOUNDED ITEM Compression stockings --thigh high  20-30 mm hg Dx  Low ext edema 1 each 0  . potassium chloride SA (K-DUR,KLOR-CON) 20 MEQ tablet Take 20 mEq by mouth.     . predniSONE (DELTASONE) 5 MG tablet Take 5 mg by mouth daily with breakfast.    . sertraline (ZOLOFT) 50 MG tablet Take 1 tablet (50 mg total) by mouth daily. 90 tablet 3  . tacrolimus (PROGRAF) 1 MG capsule Take 2 mg by mouth daily. 2 mg po q am and 1 mg po q HS     No current facility-administered medications on file prior to  visit.      Objective:  Objective  Physical Exam  Constitutional: She is oriented to person, place, and time. She appears well-developed and well-nourished.  HENT:  Head: Normocephalic and atraumatic.  Eyes: Conjunctivae and EOM are normal.  Neck: Normal range of motion. Neck supple. No JVD present. Carotid bruit is not present. No thyromegaly present.  Cardiovascular: Normal rate and normal heart sounds. An irregularly irregular rhythm present.  No murmur heard. Pulmonary/Chest: Effort normal and breath sounds normal. No respiratory distress. She has no wheezes. She has no rales. She exhibits no tenderness.  Musculoskeletal: She exhibits no edema.  Neurological: She is alert and oriented to person, place, and time.  Pt too weak to walk --- in one of our wheelchairs Requesting home pt to help with her strength  Psychiatric: She has a normal mood and affect.  Nursing note and vitals reviewed.  BP 131/79 (BP Location: Right Arm, Cuff Size: Large)   Pulse (!) 133   Temp 98.4 F (36.9 C) (Oral)   Resp 16   Ht 5\' 5"  (1.651 m)   Wt 194 lb 3.2 oz (88.1 kg)    SpO2 96%   BMI 32.32 kg/m  Wt Readings from Last 3 Encounters:  03/24/18 194 lb 3.2 oz (88.1 kg)  03/14/18 201 lb (91.2 kg)  03/07/18 201 lb (91.2 kg)     Lab Results  Component Value Date   WBC 9.8 03/19/2018   HGB 11.7 (A) 03/19/2018   HCT 36 03/19/2018   PLT 156 03/19/2018   GLUCOSE 89 02/05/2018   CHOL  11/16/2008    184        ATP III CLASSIFICATION:  <200     mg/dL   Desirable  200-239  mg/dL   Borderline High  >=240    mg/dL   High          TRIG 157 (H) 09/15/2011   HDL 33 (L) 11/16/2008   LDLCALC (H) 11/16/2008    130        Total Cholesterol/HDL:CHD Risk Coronary Heart Disease Risk Table                     Men   Women  1/2 Average Risk   3.4   3.3  Average Risk       5.0   4.4  2 X Average Risk   9.6   7.1  3 X Average Risk  23.4   11.0        Use the calculated Patient Ratio above and the CHD Risk Table to determine the patient's CHD Risk.        ATP III CLASSIFICATION (LDL):  <100     mg/dL   Optimal  100-129  mg/dL   Near or Above                    Optimal  130-159  mg/dL   Borderline  160-189  mg/dL   High  >190     mg/dL   Very High   ALT 57 (H) 02/03/2018   AST 76 (H) 02/03/2018   NA 143 03/19/2018   K 2.9 (A) 03/19/2018   CL 99 02/05/2018   CREATININE 1.7 (A) 03/19/2018   BUN 15 03/19/2018   CO2 24 02/05/2018   TSH 2.655 09/11/2011   INR 1.90 11/03/2017   HGBA1C  11/16/2008    5.5 (NOTE) The ADA recommends the following therapeutic goal for glycemic control related to  Hgb A1c measurement: Goal of therapy: <6.5 Hgb A1c  Reference: American Diabetes Association: Clinical Practice Recommendations 2010, Diabetes Care, 2010, 33: (Suppl  1).    US Renal Transplant W/doppler  Result Date: 02/03/2018 CLINICAL DATA:  Acute renal failure. EXAM: ULTRASOUND OF RENAL TRANSPLANT WITH RENAL DOPPLER ULTRASOUND TECHNIQUE: Ultrasound examination of the renal transplant was performed with gray-scale, color and duplex doppler evaluation. Technologist  describes technically difficult study with limited vascular evaluation attributed to body habitus and overlying bowel gas. COMPARISON:  None. FINDINGS: Transplant kidney location: Right lower quadrant Transplant Kidney: Length: 11 cm. Normal in size and parenchymal echogenicity. No evidence of mass or hydronephrosis. No peri-transplant fluid collection seen. Color flow in the main renal artery:  Limited evaluation Color flow in the main renal vein:  Limited evaluation Duplex Doppler Evaluation: Main Renal Artery Resistive Index: Not obtained Venous waveform in main renal vein:  Not demonstrated Intrarenal resistive index in upper pole:  Not obtained (normal 0.6-0.8; equivocal 0.8-0.9; abnormal >= 0.9) Intrarenal resistive index in lower pole: Not obtained (normal 0.6-0.8; equivocal 0.8-0.9; abnormal >= 0.9) Bladder: Normal for degree of bladder distention. Unilateral ureteral jet documented. Other findings: There is a 8.9 x 5.5 x 6.3 cm deep subcutaneous fluid collection superior and lateral to the transplant kidney without significant mass effect upon the kidney or collecting system. IMPRESSION: 1. No hydronephrosis of transplant kidney. Limited vascular evaluation. 2. 8.9 cm deep subcutaneous fluid collection superior and lateral to the transplant kidney without significant mass effect on the kidney. Electronically Signed   By: Lucrezia Europe M.D.   On: 02/03/2018 21:52   Dg Chest Portable 1 View  Result Date: 02/03/2018 CLINICAL DATA:  Chest pain. Shortness of breath. Generalized weakness. EXAM: PORTABLE CHEST 1 VIEW COMPARISON:  Two-view chest x-ray 11/03/2017. FINDINGS: Heart is enlarged. Defibrillator pad is in place. Lung volumes are low. Mild pulmonary vascular congestion is present. No focal airspace disease is evident. IMPRESSION: 1. Cardiomegaly and mild pulmonary vascular congestion without frank edema. Early congestive heart failure is not excluded. 2. No focal airspace disease. Electronically Signed    By: San Morelle M.D.   On: 02/03/2018 16:16     Assessment & Plan:  Plan  I am having [name] maintain her docusate sodium, aspirin EC, mycophenolate, sertraline, albuterol, predniSONE, acetaminophen, furosemide, NONFORMULARY OR COMPOUNDED ITEM, potassium chloride SA, Magnesium, Mouthwash Compounding Base (MOUTH WASH-GP PO), clopidogrel, tacrolimus, budesonide-formoterol, and metoprolol tartrate.  No orders of the defined types were placed in this encounter.   Problem List Items Addressed This Visit      Unprioritized   Acute diastolic (congestive) heart failure ([place])    S/p watchmen 3/ 2019      Acute kidney injury superimposed on chronic kidney disease ([place])    Stable S/p renal transplant F/u nephrology      Atrial fibrillation with RVR ([place])    Pacemaker to be placed 04/2018      CVA (cerebrovascular accident due to intracerebral hemorrhage) ([place])    No new symptoms        ESRD (end stage renal disease) on dialysis ([place])    Other Visit Diagnoses    History of pulmonary embolus (PE)    -  Primary   Relevant Orders   Ambulatory referral to Home Health   Congestive heart failure, unspecified HF chronicity, unspecified heart failure type [facility])       Relevant Orders   Ambulatory referral to Home Health   Paroxysmal atrial fibrillation ([place])  Relevant Orders   Ambulatory referral to [place]   History of cerebrovascular accident (CVA) greater than eight weeks in the past       Relevant Orders   Ambulatory referral to [place]      Follow-up: Return in about 3 months (around 06/23/2018) for hypertension, hyperlipidemia.  [name], DO

## 2018-03-24 NOTE — Assessment & Plan Note (Signed)
No new symptoms.

## 2018-03-24 NOTE — Assessment & Plan Note (Signed)
S/p watchmen 3/ 2019

## 2018-03-24 NOTE — Patient Instructions (Signed)
Atrial Fibrillation Atrial fibrillation is a type of irregular or rapid heartbeat (arrhythmia). In atrial fibrillation, the heart quivers continuously in a chaotic pattern. This occurs when parts of the heart receive disorganized signals that make the heart unable to pump blood normally. This can increase the risk for stroke, heart failure, and other heart-related conditions. There are different types of atrial fibrillation, including:  Paroxysmal atrial fibrillation. This type starts suddenly, and it usually stops on its own shortly after it starts.  Persistent atrial fibrillation. This type often lasts longer than a week. It may stop on its own or with treatment.  Long-lasting persistent atrial fibrillation. This type lasts longer than 12 months.  Permanent atrial fibrillation. This type does not go away.  Talk with your health care provider to learn about the type of atrial fibrillation that you have. What are the causes? This condition is caused by some heart-related conditions or procedures, including:  A heart attack.  Coronary artery disease.  Heart failure.  Heart valve conditions.  High blood pressure.  Inflammation of the sac that surrounds the heart (pericarditis).  Heart surgery.  Certain heart rhythm disorders, such as Wolf-Parkinson-White syndrome.  Other causes include:  Pneumonia.  Obstructive sleep apnea.  Blockage of an artery in the lungs (pulmonary embolism, or PE).  Lung cancer.  Chronic lung disease.  Thyroid problems, especially if the thyroid is overactive (hyperthyroidism).  Caffeine.  Excessive alcohol use or illegal drug use.  Use of some medicines, including certain decongestants and diet pills.  Sometimes, the cause cannot be found. What increases the risk? This condition is more likely to develop in:  People who are older in age.  People who smoke.  People who have diabetes mellitus.  People who are overweight  (obese).  Athletes who exercise vigorously.  What are the signs or symptoms? Symptoms of this condition include:  A feeling that your heart is beating rapidly or irregularly.  A feeling of discomfort or pain in your chest.  Shortness of breath.  Sudden light-headedness or weakness.  Getting tired easily during exercise.  In some cases, there are no symptoms. How is this diagnosed? Your health care provider may be able to detect atrial fibrillation when taking your pulse. If detected, this condition may be diagnosed with:  An electrocardiogram (ECG).  A Holter monitor test that records your heartbeat patterns over a 24-hour period.  Transthoracic echocardiogram (TTE) to evaluate how blood flows through your heart.  Transesophageal echocardiogram (TEE) to view more detailed images of your heart.  A stress test.  Imaging tests, such as a CT scan or chest X-ray.  Blood tests.  How is this treated? The main goals of treatment are to prevent blood clots from forming and to keep your heart beating at a normal rate and rhythm. The type of treatment that you receive depends on many factors, such as your underlying medical conditions and how you feel when you are experiencing atrial fibrillation. This condition may be treated with:  Medicine to slow down the heart rate, bring the heart's rhythm back to normal, or prevent clots from forming.  Electrical cardioversion. This is a procedure that resets your heart's rhythm by delivering a controlled, low-energy shock to the heart through your skin.  Different types of ablation, such as catheter ablation, catheter ablation with pacemaker, or surgical ablation. These procedures destroy the heart tissues that send abnormal signals. When the pacemaker is used, it is placed under your skin to help your heart beat in   a regular rhythm.  Follow these instructions at home:  Take over-the counter and prescription medicines only as told by your  health care provider.  If your health care provider prescribed a blood-thinning medicine (anticoagulant), take it exactly as told. Taking too much blood-thinning medicine can cause bleeding. If you do not take enough blood-thinning medicine, you will not have the protection that you need against stroke and other problems.  Do not use tobacco products, including cigarettes, chewing tobacco, and e-cigarettes. If you need help quitting, ask your health care provider.  If you have obstructive sleep apnea, manage your condition as told by your health care provider.  Do not drink alcohol.  Do not drink beverages that contain caffeine, such as coffee, soda, and tea.  Maintain a healthy weight. Do not use diet pills unless your health care provider approves. Diet pills may make heart problems worse.  Follow diet instructions as told by your health care provider.  Exercise regularly as told by your health care provider.  Keep all follow-up visits as told by your health care provider. This is important. How is this prevented?  Avoid drinking beverages that contain caffeine or alcohol.  Avoid certain medicines, especially medicines that are used for breathing problems.  Avoid certain herbs and herbal medicines, such as those that contain ephedra or ginseng.  Do not use illegal drugs, such as cocaine and amphetamines.  Do not smoke.  Manage your high blood pressure. Contact a health care provider if:  You notice a change in the rate, rhythm, or strength of your heartbeat.  You are taking an anticoagulant and you notice increased bruising.  You tire more easily when you exercise or exert yourself. Get help right away if:  You have chest pain, abdominal pain, sweating, or weakness.  You feel nauseous.  You notice blood in your vomit, bowel movement, or urine.  You have shortness of breath.  You suddenly have swollen feet and ankles.  You feel dizzy.  You have sudden weakness or  numbness of the face, arm, or leg, especially on one side of the body.  You have trouble speaking, trouble understanding, or both (aphasia).  Your face or your eyelid droops on one side. These symptoms may represent a serious problem that is an emergency. Do not wait to see if the symptoms will go away. Get medical help right away. Call your local emergency services (911 in the [place] Do not drive yourself to the hospital. This information is not intended to replace advice given to you by your health care provider. Make sure you discuss any questions you have with your health care provider. Document Released: 07/02/2005 Document Revised: 11/09/2015 Document Reviewed: 10/27/2014 Elsevier Interactive Patient Education  2018 Elsevier Inc.  

## 2018-03-24 NOTE — Assessment & Plan Note (Signed)
Stable S/p renal transplant F/u nephrology

## 2018-03-25 ENCOUNTER — Telehealth: Payer: Self-pay | Admitting: *Deleted

## 2018-03-25 NOTE — Telephone Encounter (Signed)
Copied from [place] [phone]. Topic: General - Other >> Mar 24, 2018  1:26 PM Yvette Rack wrote: Reason for CRM: Nurse Darlina Guys from [facility] [phone] calling to let Dr Doristine Counter chase know that they will be going to see pt on the 12th for services they haven't been able to reach pt

## 2018-03-25 NOTE — Telephone Encounter (Signed)
[facility] will use [facility].  Gwen disregard message to you about home health

## 2018-03-25 NOTE — Telephone Encounter (Signed)
Patient prefers [name].  Can we use them instead of [facility]? Do I need to put in another referral?

## 2018-03-25 NOTE — Telephone Encounter (Signed)
Pt called and stated that she would like to talk to sheketia. She did not want to talk to me or give me any information.

## 2018-03-26 ENCOUNTER — Telehealth: Payer: Self-pay | Admitting: Family Medicine

## 2018-03-26 DIAGNOSIS — G4733 Obstructive sleep apnea (adult) (pediatric): Secondary | ICD-10-CM | POA: Diagnosis not present

## 2018-03-26 DIAGNOSIS — I5031 Acute diastolic (congestive) heart failure: Secondary | ICD-10-CM | POA: Diagnosis not present

## 2018-03-26 DIAGNOSIS — R531 Weakness: Secondary | ICD-10-CM | POA: Diagnosis not present

## 2018-03-26 DIAGNOSIS — F419 Anxiety disorder, unspecified: Secondary | ICD-10-CM | POA: Diagnosis not present

## 2018-03-26 DIAGNOSIS — F329 Major depressive disorder, single episode, unspecified: Secondary | ICD-10-CM | POA: Diagnosis not present

## 2018-03-26 DIAGNOSIS — E669 Obesity, unspecified: Secondary | ICD-10-CM | POA: Diagnosis not present

## 2018-03-26 DIAGNOSIS — Z8673 Personal history of transient ischemic attack (TIA), and cerebral infarction without residual deficits: Secondary | ICD-10-CM | POA: Diagnosis not present

## 2018-03-26 DIAGNOSIS — Z7951 Long term (current) use of inhaled steroids: Secondary | ICD-10-CM | POA: Diagnosis not present

## 2018-03-26 DIAGNOSIS — N186 End stage renal disease: Secondary | ICD-10-CM | POA: Diagnosis not present

## 2018-03-26 DIAGNOSIS — I132 Hypertensive heart and chronic kidney disease with heart failure and with stage 5 chronic kidney disease, or end stage renal disease: Secondary | ICD-10-CM | POA: Diagnosis not present

## 2018-03-26 DIAGNOSIS — Z7902 Long term (current) use of antithrombotics/antiplatelets: Secondary | ICD-10-CM | POA: Diagnosis not present

## 2018-03-26 DIAGNOSIS — Z95818 Presence of other cardiac implants and grafts: Secondary | ICD-10-CM | POA: Diagnosis not present

## 2018-03-26 DIAGNOSIS — M1612 Unilateral primary osteoarthritis, left hip: Secondary | ICD-10-CM | POA: Diagnosis not present

## 2018-03-26 DIAGNOSIS — I480 Paroxysmal atrial fibrillation: Secondary | ICD-10-CM | POA: Diagnosis not present

## 2018-03-26 DIAGNOSIS — Z940 Kidney transplant status: Secondary | ICD-10-CM | POA: Diagnosis not present

## 2018-03-26 DIAGNOSIS — I739 Peripheral vascular disease, unspecified: Secondary | ICD-10-CM | POA: Diagnosis not present

## 2018-03-26 DIAGNOSIS — D631 Anemia in chronic kidney disease: Secondary | ICD-10-CM | POA: Diagnosis not present

## 2018-03-26 DIAGNOSIS — Z7952 Long term (current) use of systemic steroids: Secondary | ICD-10-CM | POA: Diagnosis not present

## 2018-03-26 DIAGNOSIS — J45909 Unspecified asthma, uncomplicated: Secondary | ICD-10-CM | POA: Diagnosis not present

## 2018-03-26 DIAGNOSIS — Z96641 Presence of right artificial hip joint: Secondary | ICD-10-CM | POA: Diagnosis not present

## 2018-03-26 DIAGNOSIS — Z6832 Body mass index (BMI) 32.0-32.9, adult: Secondary | ICD-10-CM | POA: Diagnosis not present

## 2018-03-26 DIAGNOSIS — Z86718 Personal history of other venous thrombosis and embolism: Secondary | ICD-10-CM | POA: Diagnosis not present

## 2018-03-26 NOTE — Telephone Encounter (Signed)
Copied from [place] [id number]. Topic: Inquiry >> Mar 26, 2018 11:22 AM Oliver Pila B wrote: Reason for CRM: [facility] called for verbals for PT , contact [phone]  Called and gave verbal ok per PCP ok verbal PT order.

## 2018-03-28 ENCOUNTER — Telehealth: Payer: Self-pay

## 2018-03-28 DIAGNOSIS — I132 Hypertensive heart and chronic kidney disease with heart failure and with stage 5 chronic kidney disease, or end stage renal disease: Secondary | ICD-10-CM | POA: Diagnosis not present

## 2018-03-28 DIAGNOSIS — R531 Weakness: Secondary | ICD-10-CM | POA: Diagnosis not present

## 2018-03-28 DIAGNOSIS — M1612 Unilateral primary osteoarthritis, left hip: Secondary | ICD-10-CM | POA: Diagnosis not present

## 2018-03-28 DIAGNOSIS — D631 Anemia in chronic kidney disease: Secondary | ICD-10-CM | POA: Diagnosis not present

## 2018-03-28 DIAGNOSIS — I5031 Acute diastolic (congestive) heart failure: Secondary | ICD-10-CM | POA: Diagnosis not present

## 2018-03-28 DIAGNOSIS — N186 End stage renal disease: Secondary | ICD-10-CM | POA: Diagnosis not present

## 2018-03-28 NOTE — Telephone Encounter (Signed)
Copied from [place] [phone]. Topic: General - Other >> Mar 28, 2018 12:34 PM Burchel, Abbi R wrote: Darlina Guys ([facility]) called  to let Dr Carollee Herter know pt will be seen on 03/30/18 to get started with their services.   [phone]

## 2018-03-28 NOTE — Telephone Encounter (Signed)
Thank you :)

## 2018-03-31 DIAGNOSIS — R531 Weakness: Secondary | ICD-10-CM | POA: Diagnosis not present

## 2018-03-31 DIAGNOSIS — I132 Hypertensive heart and chronic kidney disease with heart failure and with stage 5 chronic kidney disease, or end stage renal disease: Secondary | ICD-10-CM | POA: Diagnosis not present

## 2018-03-31 DIAGNOSIS — D631 Anemia in chronic kidney disease: Secondary | ICD-10-CM | POA: Diagnosis not present

## 2018-03-31 DIAGNOSIS — M1612 Unilateral primary osteoarthritis, left hip: Secondary | ICD-10-CM | POA: Diagnosis not present

## 2018-03-31 DIAGNOSIS — I5031 Acute diastolic (congestive) heart failure: Secondary | ICD-10-CM | POA: Diagnosis not present

## 2018-03-31 DIAGNOSIS — N186 End stage renal disease: Secondary | ICD-10-CM | POA: Diagnosis not present

## 2018-04-01 DIAGNOSIS — I4891 Unspecified atrial fibrillation: Secondary | ICD-10-CM | POA: Diagnosis not present

## 2018-04-03 ENCOUNTER — Telehealth: Payer: Self-pay | Admitting: Family Medicine

## 2018-04-03 DIAGNOSIS — N186 End stage renal disease: Secondary | ICD-10-CM | POA: Diagnosis not present

## 2018-04-03 DIAGNOSIS — I132 Hypertensive heart and chronic kidney disease with heart failure and with stage 5 chronic kidney disease, or end stage renal disease: Secondary | ICD-10-CM | POA: Diagnosis not present

## 2018-04-03 DIAGNOSIS — R531 Weakness: Secondary | ICD-10-CM | POA: Diagnosis not present

## 2018-04-03 DIAGNOSIS — M1612 Unilateral primary osteoarthritis, left hip: Secondary | ICD-10-CM | POA: Diagnosis not present

## 2018-04-03 DIAGNOSIS — Z940 Kidney transplant status: Secondary | ICD-10-CM | POA: Diagnosis not present

## 2018-04-03 DIAGNOSIS — I5031 Acute diastolic (congestive) heart failure: Secondary | ICD-10-CM | POA: Diagnosis not present

## 2018-04-03 DIAGNOSIS — D631 Anemia in chronic kidney disease: Secondary | ICD-10-CM | POA: Diagnosis not present

## 2018-04-03 NOTE — Telephone Encounter (Signed)
Many of her meds can cause dry mouth--- she can use biotine otc to try to help this

## 2018-04-03 NOTE — Telephone Encounter (Signed)
Copied from [place] [phone]. Topic: Quick Communication - See Telephone Encounter >> Apr 03, 2018 12:48 PM Ivar Drape wrote: CRM for notification. See Telephone encounter for: 04/03/18. Patient is suffering from dry mouth and would like to know which of her medications is causing this and why.

## 2018-04-04 NOTE — Telephone Encounter (Signed)
Notified pt and she voices understanding. 

## 2018-04-07 DIAGNOSIS — M1612 Unilateral primary osteoarthritis, left hip: Secondary | ICD-10-CM | POA: Diagnosis not present

## 2018-04-07 DIAGNOSIS — I5031 Acute diastolic (congestive) heart failure: Secondary | ICD-10-CM | POA: Diagnosis not present

## 2018-04-07 DIAGNOSIS — R531 Weakness: Secondary | ICD-10-CM | POA: Diagnosis not present

## 2018-04-07 DIAGNOSIS — D631 Anemia in chronic kidney disease: Secondary | ICD-10-CM | POA: Diagnosis not present

## 2018-04-07 DIAGNOSIS — N186 End stage renal disease: Secondary | ICD-10-CM | POA: Diagnosis not present

## 2018-04-07 DIAGNOSIS — I132 Hypertensive heart and chronic kidney disease with heart failure and with stage 5 chronic kidney disease, or end stage renal disease: Secondary | ICD-10-CM | POA: Diagnosis not present

## 2018-04-11 ENCOUNTER — Other Ambulatory Visit: Payer: Self-pay | Admitting: Internal Medicine

## 2018-04-11 ENCOUNTER — Telehealth: Payer: Self-pay

## 2018-04-11 DIAGNOSIS — I132 Hypertensive heart and chronic kidney disease with heart failure and with stage 5 chronic kidney disease, or end stage renal disease: Secondary | ICD-10-CM | POA: Diagnosis not present

## 2018-04-11 DIAGNOSIS — N186 End stage renal disease: Secondary | ICD-10-CM | POA: Diagnosis not present

## 2018-04-11 DIAGNOSIS — D631 Anemia in chronic kidney disease: Secondary | ICD-10-CM | POA: Diagnosis not present

## 2018-04-11 DIAGNOSIS — I5031 Acute diastolic (congestive) heart failure: Secondary | ICD-10-CM | POA: Diagnosis not present

## 2018-04-11 DIAGNOSIS — M1612 Unilateral primary osteoarthritis, left hip: Secondary | ICD-10-CM | POA: Diagnosis not present

## 2018-04-11 DIAGNOSIS — R531 Weakness: Secondary | ICD-10-CM | POA: Diagnosis not present

## 2018-04-11 NOTE — Telephone Encounter (Signed)
Copied from [place] [phone]. Topic: Inquiry >> Apr 11, 2018 10:24 AM [name] wrote: Reason for CRM: [facility] called (sean) to let pcp know about the PT visit today; contact if needed [phone]   bp 152/88 Heart rate  102 then came down to 72

## 2018-04-14 ENCOUNTER — Telehealth: Payer: Self-pay | Admitting: *Deleted

## 2018-04-14 DIAGNOSIS — Z7902 Long term (current) use of antithrombotics/antiplatelets: Secondary | ICD-10-CM | POA: Diagnosis not present

## 2018-04-14 DIAGNOSIS — Z9181 History of falling: Secondary | ICD-10-CM | POA: Diagnosis not present

## 2018-04-14 DIAGNOSIS — R002 Palpitations: Secondary | ICD-10-CM | POA: Diagnosis not present

## 2018-04-14 DIAGNOSIS — Z7982 Long term (current) use of aspirin: Secondary | ICD-10-CM | POA: Diagnosis not present

## 2018-04-14 DIAGNOSIS — Z9889 Other specified postprocedural states: Secondary | ICD-10-CM | POA: Diagnosis not present

## 2018-04-14 DIAGNOSIS — Z888 Allergy status to other drugs, medicaments and biological substances status: Secondary | ICD-10-CM | POA: Diagnosis not present

## 2018-04-14 DIAGNOSIS — Z79899 Other long term (current) drug therapy: Secondary | ICD-10-CM | POA: Diagnosis not present

## 2018-04-14 NOTE — Telephone Encounter (Signed)
.  Received Home Health Certification and Plan of Care; forwarded to provider/SLS 09/30   

## 2018-04-15 ENCOUNTER — Telehealth: Payer: Self-pay | Admitting: *Deleted

## 2018-04-15 DIAGNOSIS — M109 Gout, unspecified: Secondary | ICD-10-CM | POA: Diagnosis not present

## 2018-04-15 DIAGNOSIS — D631 Anemia in chronic kidney disease: Secondary | ICD-10-CM | POA: Diagnosis not present

## 2018-04-15 DIAGNOSIS — R319 Hematuria, unspecified: Secondary | ICD-10-CM | POA: Diagnosis not present

## 2018-04-15 DIAGNOSIS — N2581 Secondary hyperparathyroidism of renal origin: Secondary | ICD-10-CM | POA: Diagnosis not present

## 2018-04-15 DIAGNOSIS — R809 Proteinuria, unspecified: Secondary | ICD-10-CM | POA: Diagnosis not present

## 2018-04-15 DIAGNOSIS — N390 Urinary tract infection, site not specified: Secondary | ICD-10-CM | POA: Diagnosis not present

## 2018-04-15 DIAGNOSIS — D899 Disorder involving the immune mechanism, unspecified: Secondary | ICD-10-CM | POA: Diagnosis not present

## 2018-04-15 DIAGNOSIS — N184 Chronic kidney disease, stage 4 (severe): Secondary | ICD-10-CM | POA: Diagnosis not present

## 2018-04-15 DIAGNOSIS — N179 Acute kidney failure, unspecified: Secondary | ICD-10-CM | POA: Diagnosis not present

## 2018-04-15 DIAGNOSIS — I129 Hypertensive chronic kidney disease with stage 1 through stage 4 chronic kidney disease, or unspecified chronic kidney disease: Secondary | ICD-10-CM | POA: Diagnosis not present

## 2018-04-15 DIAGNOSIS — Z79899 Other long term (current) drug therapy: Secondary | ICD-10-CM | POA: Diagnosis not present

## 2018-04-15 DIAGNOSIS — Z940 Kidney transplant status: Secondary | ICD-10-CM | POA: Diagnosis not present

## 2018-04-15 NOTE — Telephone Encounter (Signed)
Received F2F request for 03/24/18 from [facility]; forwarded to provider with attached OV notes/SLS 10/01

## 2018-04-16 ENCOUNTER — Telehealth: Payer: Self-pay | Admitting: Family Medicine

## 2018-04-16 NOTE — Telephone Encounter (Signed)
Author phoned Hilliard Clark from [facility] to give VO to hold visits this week d/t surgery tmr., as pt. Is likely to be admitted per [facility]. VO given.

## 2018-04-16 NOTE — Telephone Encounter (Signed)
Copied from [place] [phone]. Topic: General - Other >> Apr 16, 2018  9:26 AM Lennox Solders wrote: Reason for ZVG:JFTN PT [facility] is calling and he needs verbal order to hold 2 visit this week due to pt is getting pacemaker tomorrow

## 2018-04-17 DIAGNOSIS — Z940 Kidney transplant status: Secondary | ICD-10-CM | POA: Diagnosis not present

## 2018-04-17 DIAGNOSIS — Z9889 Other specified postprocedural states: Secondary | ICD-10-CM | POA: Diagnosis not present

## 2018-04-17 DIAGNOSIS — I5030 Unspecified diastolic (congestive) heart failure: Secondary | ICD-10-CM | POA: Diagnosis not present

## 2018-04-17 DIAGNOSIS — Z7901 Long term (current) use of anticoagulants: Secondary | ICD-10-CM | POA: Diagnosis not present

## 2018-04-17 DIAGNOSIS — Z86711 Personal history of pulmonary embolism: Secondary | ICD-10-CM | POA: Diagnosis not present

## 2018-04-17 DIAGNOSIS — I4819 Other persistent atrial fibrillation: Secondary | ICD-10-CM | POA: Diagnosis not present

## 2018-04-17 DIAGNOSIS — I110 Hypertensive heart disease with heart failure: Secondary | ICD-10-CM | POA: Diagnosis not present

## 2018-04-18 ENCOUNTER — Telehealth: Payer: Self-pay | Admitting: *Deleted

## 2018-04-18 DIAGNOSIS — Z940 Kidney transplant status: Secondary | ICD-10-CM | POA: Diagnosis not present

## 2018-04-18 DIAGNOSIS — Z9889 Other specified postprocedural states: Secondary | ICD-10-CM | POA: Diagnosis not present

## 2018-04-18 DIAGNOSIS — I4819 Other persistent atrial fibrillation: Secondary | ICD-10-CM | POA: Diagnosis not present

## 2018-04-18 DIAGNOSIS — I110 Hypertensive heart disease with heart failure: Secondary | ICD-10-CM | POA: Diagnosis not present

## 2018-04-18 DIAGNOSIS — Z950 Presence of cardiac pacemaker: Secondary | ICD-10-CM | POA: Insufficient documentation

## 2018-04-18 DIAGNOSIS — I5030 Unspecified diastolic (congestive) heart failure: Secondary | ICD-10-CM | POA: Diagnosis not present

## 2018-04-18 DIAGNOSIS — Z7901 Long term (current) use of anticoagulants: Secondary | ICD-10-CM | POA: Diagnosis not present

## 2018-04-18 NOTE — Telephone Encounter (Signed)
Received Physician Orders from [facility]; forwarded to provider/SLS 10/04

## 2018-04-20 DIAGNOSIS — I4891 Unspecified atrial fibrillation: Secondary | ICD-10-CM | POA: Diagnosis not present

## 2018-04-23 DIAGNOSIS — R531 Weakness: Secondary | ICD-10-CM | POA: Diagnosis not present

## 2018-04-23 DIAGNOSIS — M1612 Unilateral primary osteoarthritis, left hip: Secondary | ICD-10-CM | POA: Diagnosis not present

## 2018-04-23 DIAGNOSIS — I5031 Acute diastolic (congestive) heart failure: Secondary | ICD-10-CM | POA: Diagnosis not present

## 2018-04-23 DIAGNOSIS — D631 Anemia in chronic kidney disease: Secondary | ICD-10-CM | POA: Diagnosis not present

## 2018-04-23 DIAGNOSIS — N186 End stage renal disease: Secondary | ICD-10-CM | POA: Diagnosis not present

## 2018-04-23 DIAGNOSIS — I132 Hypertensive heart and chronic kidney disease with heart failure and with stage 5 chronic kidney disease, or end stage renal disease: Secondary | ICD-10-CM | POA: Diagnosis not present

## 2018-04-25 DIAGNOSIS — R531 Weakness: Secondary | ICD-10-CM | POA: Diagnosis not present

## 2018-04-25 DIAGNOSIS — Z940 Kidney transplant status: Secondary | ICD-10-CM | POA: Diagnosis not present

## 2018-04-25 DIAGNOSIS — I5031 Acute diastolic (congestive) heart failure: Secondary | ICD-10-CM | POA: Diagnosis not present

## 2018-04-25 DIAGNOSIS — N186 End stage renal disease: Secondary | ICD-10-CM | POA: Diagnosis not present

## 2018-04-25 DIAGNOSIS — I132 Hypertensive heart and chronic kidney disease with heart failure and with stage 5 chronic kidney disease, or end stage renal disease: Secondary | ICD-10-CM | POA: Diagnosis not present

## 2018-04-25 DIAGNOSIS — N184 Chronic kidney disease, stage 4 (severe): Secondary | ICD-10-CM | POA: Diagnosis not present

## 2018-04-25 DIAGNOSIS — D631 Anemia in chronic kidney disease: Secondary | ICD-10-CM | POA: Diagnosis not present

## 2018-04-25 DIAGNOSIS — M1612 Unilateral primary osteoarthritis, left hip: Secondary | ICD-10-CM | POA: Diagnosis not present

## 2018-04-28 DIAGNOSIS — D631 Anemia in chronic kidney disease: Secondary | ICD-10-CM | POA: Diagnosis not present

## 2018-04-28 DIAGNOSIS — N186 End stage renal disease: Secondary | ICD-10-CM | POA: Diagnosis not present

## 2018-04-28 DIAGNOSIS — I132 Hypertensive heart and chronic kidney disease with heart failure and with stage 5 chronic kidney disease, or end stage renal disease: Secondary | ICD-10-CM | POA: Diagnosis not present

## 2018-04-28 DIAGNOSIS — I5031 Acute diastolic (congestive) heart failure: Secondary | ICD-10-CM | POA: Diagnosis not present

## 2018-04-28 DIAGNOSIS — M1612 Unilateral primary osteoarthritis, left hip: Secondary | ICD-10-CM | POA: Diagnosis not present

## 2018-04-28 DIAGNOSIS — R531 Weakness: Secondary | ICD-10-CM | POA: Diagnosis not present

## 2018-04-29 DIAGNOSIS — Z45018 Encounter for adjustment and management of other part of cardiac pacemaker: Secondary | ICD-10-CM | POA: Diagnosis not present

## 2018-04-29 DIAGNOSIS — I4891 Unspecified atrial fibrillation: Secondary | ICD-10-CM | POA: Diagnosis not present

## 2018-04-30 ENCOUNTER — Telehealth: Payer: Self-pay | Admitting: Family Medicine

## 2018-04-30 DIAGNOSIS — I5031 Acute diastolic (congestive) heart failure: Secondary | ICD-10-CM | POA: Diagnosis not present

## 2018-04-30 DIAGNOSIS — N186 End stage renal disease: Secondary | ICD-10-CM | POA: Diagnosis not present

## 2018-04-30 DIAGNOSIS — M1612 Unilateral primary osteoarthritis, left hip: Secondary | ICD-10-CM | POA: Diagnosis not present

## 2018-04-30 DIAGNOSIS — R531 Weakness: Secondary | ICD-10-CM | POA: Diagnosis not present

## 2018-04-30 DIAGNOSIS — D631 Anemia in chronic kidney disease: Secondary | ICD-10-CM | POA: Diagnosis not present

## 2018-04-30 DIAGNOSIS — I132 Hypertensive heart and chronic kidney disease with heart failure and with stage 5 chronic kidney disease, or end stage renal disease: Secondary | ICD-10-CM | POA: Diagnosis not present

## 2018-04-30 NOTE — Telephone Encounter (Signed)
Copied from [place] [id number]. Topic: General - Other >> Apr 30, 2018  1:38 PM Keene Breath wrote: Reason for CRM: Hilliard Clark with [facility] called to inform doctor that patient had a fall on Monday coming out of the bathroom, also had a fall today, 04/30/18 as well.  Patient said she did not have any pain.  Hilliard Clark also wanted to extend PT for - 2x wk for 3 wks.  Please advise.  CB# [phone].

## 2018-05-02 NOTE — Telephone Encounter (Signed)
Author phoned [name] and gave VO for PT extension. Pt. Had pacemaker placed recently, and has been weak recently, [name] states, but no reports of hitting head during known fall episodes. [name] reviewed safety precautions with pt. And family, and pt. Now has a bedside commode to use. "She's doing better, but still weak" [name] states. No other concerns at this time.

## 2018-05-02 NOTE — Telephone Encounter (Signed)
Ok to give verbal ok?

## 2018-05-05 DIAGNOSIS — N186 End stage renal disease: Secondary | ICD-10-CM | POA: Diagnosis not present

## 2018-05-05 DIAGNOSIS — I5031 Acute diastolic (congestive) heart failure: Secondary | ICD-10-CM | POA: Diagnosis not present

## 2018-05-05 DIAGNOSIS — R531 Weakness: Secondary | ICD-10-CM | POA: Diagnosis not present

## 2018-05-05 DIAGNOSIS — I132 Hypertensive heart and chronic kidney disease with heart failure and with stage 5 chronic kidney disease, or end stage renal disease: Secondary | ICD-10-CM | POA: Diagnosis not present

## 2018-05-05 DIAGNOSIS — D631 Anemia in chronic kidney disease: Secondary | ICD-10-CM | POA: Diagnosis not present

## 2018-05-05 DIAGNOSIS — M1612 Unilateral primary osteoarthritis, left hip: Secondary | ICD-10-CM | POA: Diagnosis not present

## 2018-05-06 ENCOUNTER — Telehealth: Payer: Self-pay | Admitting: *Deleted

## 2018-05-06 NOTE — Telephone Encounter (Signed)
Received Physician Orders from [facility]; forwarded to provider/SLS 10/22

## 2018-05-07 ENCOUNTER — Telehealth: Payer: Self-pay | Admitting: Family Medicine

## 2018-05-07 DIAGNOSIS — I132 Hypertensive heart and chronic kidney disease with heart failure and with stage 5 chronic kidney disease, or end stage renal disease: Secondary | ICD-10-CM | POA: Diagnosis not present

## 2018-05-07 DIAGNOSIS — I5031 Acute diastolic (congestive) heart failure: Secondary | ICD-10-CM | POA: Diagnosis not present

## 2018-05-07 DIAGNOSIS — D631 Anemia in chronic kidney disease: Secondary | ICD-10-CM | POA: Diagnosis not present

## 2018-05-07 DIAGNOSIS — N186 End stage renal disease: Secondary | ICD-10-CM | POA: Diagnosis not present

## 2018-05-07 DIAGNOSIS — R531 Weakness: Secondary | ICD-10-CM | POA: Diagnosis not present

## 2018-05-07 DIAGNOSIS — M1612 Unilateral primary osteoarthritis, left hip: Secondary | ICD-10-CM | POA: Diagnosis not present

## 2018-05-07 NOTE — Telephone Encounter (Signed)
Copied from [name] [id number]. Topic: General - Other >> May 07, 2018 12:49 PM [name] wrote: Reason for CRM: [name] with [facility] home [facility] reporting pt had a fall on Monday.  Fire dept helped her up. No injury.  He went over precaution measures with pt today.

## 2018-05-12 ENCOUNTER — Other Ambulatory Visit: Payer: Self-pay | Admitting: Internal Medicine

## 2018-05-12 DIAGNOSIS — R531 Weakness: Secondary | ICD-10-CM | POA: Diagnosis not present

## 2018-05-12 DIAGNOSIS — M1612 Unilateral primary osteoarthritis, left hip: Secondary | ICD-10-CM | POA: Diagnosis not present

## 2018-05-12 DIAGNOSIS — I132 Hypertensive heart and chronic kidney disease with heart failure and with stage 5 chronic kidney disease, or end stage renal disease: Secondary | ICD-10-CM | POA: Diagnosis not present

## 2018-05-12 DIAGNOSIS — I5031 Acute diastolic (congestive) heart failure: Secondary | ICD-10-CM | POA: Diagnosis not present

## 2018-05-12 DIAGNOSIS — F329 Major depressive disorder, single episode, unspecified: Secondary | ICD-10-CM

## 2018-05-12 DIAGNOSIS — D631 Anemia in chronic kidney disease: Secondary | ICD-10-CM | POA: Diagnosis not present

## 2018-05-12 DIAGNOSIS — F32A Depression, unspecified: Secondary | ICD-10-CM

## 2018-05-12 DIAGNOSIS — N186 End stage renal disease: Secondary | ICD-10-CM | POA: Diagnosis not present

## 2018-05-13 ENCOUNTER — Other Ambulatory Visit: Payer: Self-pay | Admitting: Internal Medicine

## 2018-05-13 DIAGNOSIS — F329 Major depressive disorder, single episode, unspecified: Secondary | ICD-10-CM

## 2018-05-13 DIAGNOSIS — F32A Depression, unspecified: Secondary | ICD-10-CM

## 2018-05-14 DIAGNOSIS — I5031 Acute diastolic (congestive) heart failure: Secondary | ICD-10-CM | POA: Diagnosis not present

## 2018-05-14 DIAGNOSIS — M1612 Unilateral primary osteoarthritis, left hip: Secondary | ICD-10-CM | POA: Diagnosis not present

## 2018-05-14 DIAGNOSIS — D631 Anemia in chronic kidney disease: Secondary | ICD-10-CM | POA: Diagnosis not present

## 2018-05-14 DIAGNOSIS — N186 End stage renal disease: Secondary | ICD-10-CM | POA: Diagnosis not present

## 2018-05-14 DIAGNOSIS — R531 Weakness: Secondary | ICD-10-CM | POA: Diagnosis not present

## 2018-05-14 DIAGNOSIS — I132 Hypertensive heart and chronic kidney disease with heart failure and with stage 5 chronic kidney disease, or end stage renal disease: Secondary | ICD-10-CM | POA: Diagnosis not present

## 2018-05-15 DIAGNOSIS — N390 Urinary tract infection, site not specified: Secondary | ICD-10-CM | POA: Diagnosis not present

## 2018-05-16 DIAGNOSIS — Z940 Kidney transplant status: Secondary | ICD-10-CM | POA: Diagnosis not present

## 2018-05-19 ENCOUNTER — Other Ambulatory Visit: Payer: Self-pay | Admitting: Internal Medicine

## 2018-05-19 DIAGNOSIS — H2513 Age-related nuclear cataract, bilateral: Secondary | ICD-10-CM | POA: Diagnosis not present

## 2018-05-19 DIAGNOSIS — F329 Major depressive disorder, single episode, unspecified: Secondary | ICD-10-CM

## 2018-05-19 DIAGNOSIS — F32A Depression, unspecified: Secondary | ICD-10-CM

## 2018-05-20 ENCOUNTER — Other Ambulatory Visit: Payer: Self-pay | Admitting: Family Medicine

## 2018-05-20 ENCOUNTER — Other Ambulatory Visit: Payer: Self-pay | Admitting: Internal Medicine

## 2018-05-20 DIAGNOSIS — F32A Depression, unspecified: Secondary | ICD-10-CM

## 2018-05-20 DIAGNOSIS — F329 Major depressive disorder, single episode, unspecified: Secondary | ICD-10-CM

## 2018-05-21 DIAGNOSIS — D631 Anemia in chronic kidney disease: Secondary | ICD-10-CM | POA: Diagnosis not present

## 2018-05-21 DIAGNOSIS — R531 Weakness: Secondary | ICD-10-CM | POA: Diagnosis not present

## 2018-05-21 DIAGNOSIS — I5031 Acute diastolic (congestive) heart failure: Secondary | ICD-10-CM | POA: Diagnosis not present

## 2018-05-21 DIAGNOSIS — N186 End stage renal disease: Secondary | ICD-10-CM | POA: Diagnosis not present

## 2018-05-21 DIAGNOSIS — I132 Hypertensive heart and chronic kidney disease with heart failure and with stage 5 chronic kidney disease, or end stage renal disease: Secondary | ICD-10-CM | POA: Diagnosis not present

## 2018-05-21 DIAGNOSIS — M1612 Unilateral primary osteoarthritis, left hip: Secondary | ICD-10-CM | POA: Diagnosis not present

## 2018-05-23 ENCOUNTER — Telehealth: Payer: Self-pay | Admitting: Family Medicine

## 2018-05-23 DIAGNOSIS — R531 Weakness: Secondary | ICD-10-CM | POA: Diagnosis not present

## 2018-05-23 DIAGNOSIS — I132 Hypertensive heart and chronic kidney disease with heart failure and with stage 5 chronic kidney disease, or end stage renal disease: Secondary | ICD-10-CM | POA: Diagnosis not present

## 2018-05-23 DIAGNOSIS — I5031 Acute diastolic (congestive) heart failure: Secondary | ICD-10-CM | POA: Diagnosis not present

## 2018-05-23 DIAGNOSIS — D631 Anemia in chronic kidney disease: Secondary | ICD-10-CM | POA: Diagnosis not present

## 2018-05-23 DIAGNOSIS — N186 End stage renal disease: Secondary | ICD-10-CM | POA: Diagnosis not present

## 2018-05-23 DIAGNOSIS — M1612 Unilateral primary osteoarthritis, left hip: Secondary | ICD-10-CM | POA: Diagnosis not present

## 2018-05-23 NOTE — Telephone Encounter (Signed)
Copied from [place] [id number]. Topic: Quick Communication - Home Health Verbal Orders >> May 23, 2018  3:10 PM Leward Quan A wrote: Caller/Agency: Barb Merino with [facility] Number: [phone] Requesting OT/PT/Skilled Nursing/Social Work: PT Frequency: 2xs wk for 6 weeks

## 2018-05-25 DIAGNOSIS — Z86718 Personal history of other venous thrombosis and embolism: Secondary | ICD-10-CM | POA: Diagnosis not present

## 2018-05-25 DIAGNOSIS — I480 Paroxysmal atrial fibrillation: Secondary | ICD-10-CM | POA: Diagnosis not present

## 2018-05-25 DIAGNOSIS — M1612 Unilateral primary osteoarthritis, left hip: Secondary | ICD-10-CM | POA: Diagnosis not present

## 2018-05-25 DIAGNOSIS — J45909 Unspecified asthma, uncomplicated: Secondary | ICD-10-CM | POA: Diagnosis not present

## 2018-05-25 DIAGNOSIS — Z940 Kidney transplant status: Secondary | ICD-10-CM | POA: Diagnosis not present

## 2018-05-25 DIAGNOSIS — Z96641 Presence of right artificial hip joint: Secondary | ICD-10-CM | POA: Diagnosis not present

## 2018-05-25 DIAGNOSIS — Z7951 Long term (current) use of inhaled steroids: Secondary | ICD-10-CM | POA: Diagnosis not present

## 2018-05-25 DIAGNOSIS — Z950 Presence of cardiac pacemaker: Secondary | ICD-10-CM | POA: Diagnosis not present

## 2018-05-25 DIAGNOSIS — G4733 Obstructive sleep apnea (adult) (pediatric): Secondary | ICD-10-CM | POA: Diagnosis not present

## 2018-05-25 DIAGNOSIS — E669 Obesity, unspecified: Secondary | ICD-10-CM | POA: Diagnosis not present

## 2018-05-25 DIAGNOSIS — Z7902 Long term (current) use of antithrombotics/antiplatelets: Secondary | ICD-10-CM | POA: Diagnosis not present

## 2018-05-25 DIAGNOSIS — F419 Anxiety disorder, unspecified: Secondary | ICD-10-CM | POA: Diagnosis not present

## 2018-05-25 DIAGNOSIS — Z7982 Long term (current) use of aspirin: Secondary | ICD-10-CM | POA: Diagnosis not present

## 2018-05-25 DIAGNOSIS — N186 End stage renal disease: Secondary | ICD-10-CM | POA: Diagnosis not present

## 2018-05-25 DIAGNOSIS — Z8673 Personal history of transient ischemic attack (TIA), and cerebral infarction without residual deficits: Secondary | ICD-10-CM | POA: Diagnosis not present

## 2018-05-25 DIAGNOSIS — Z7952 Long term (current) use of systemic steroids: Secondary | ICD-10-CM | POA: Diagnosis not present

## 2018-05-25 DIAGNOSIS — I5031 Acute diastolic (congestive) heart failure: Secondary | ICD-10-CM | POA: Diagnosis not present

## 2018-05-25 DIAGNOSIS — D631 Anemia in chronic kidney disease: Secondary | ICD-10-CM | POA: Diagnosis not present

## 2018-05-25 DIAGNOSIS — R531 Weakness: Secondary | ICD-10-CM | POA: Diagnosis not present

## 2018-05-25 DIAGNOSIS — F329 Major depressive disorder, single episode, unspecified: Secondary | ICD-10-CM | POA: Diagnosis not present

## 2018-05-25 DIAGNOSIS — Z6832 Body mass index (BMI) 32.0-32.9, adult: Secondary | ICD-10-CM | POA: Diagnosis not present

## 2018-05-25 DIAGNOSIS — I739 Peripheral vascular disease, unspecified: Secondary | ICD-10-CM | POA: Diagnosis not present

## 2018-05-25 DIAGNOSIS — Z95818 Presence of other cardiac implants and grafts: Secondary | ICD-10-CM | POA: Diagnosis not present

## 2018-05-25 DIAGNOSIS — I132 Hypertensive heart and chronic kidney disease with heart failure and with stage 5 chronic kidney disease, or end stage renal disease: Secondary | ICD-10-CM | POA: Diagnosis not present

## 2018-05-26 DIAGNOSIS — D631 Anemia in chronic kidney disease: Secondary | ICD-10-CM | POA: Diagnosis not present

## 2018-05-26 DIAGNOSIS — Z950 Presence of cardiac pacemaker: Secondary | ICD-10-CM | POA: Diagnosis not present

## 2018-05-26 DIAGNOSIS — I132 Hypertensive heart and chronic kidney disease with heart failure and with stage 5 chronic kidney disease, or end stage renal disease: Secondary | ICD-10-CM | POA: Diagnosis not present

## 2018-05-26 DIAGNOSIS — R531 Weakness: Secondary | ICD-10-CM | POA: Diagnosis not present

## 2018-05-26 DIAGNOSIS — N186 End stage renal disease: Secondary | ICD-10-CM | POA: Diagnosis not present

## 2018-05-26 DIAGNOSIS — I5031 Acute diastolic (congestive) heart failure: Secondary | ICD-10-CM | POA: Diagnosis not present

## 2018-05-26 NOTE — Telephone Encounter (Signed)
Ok to give verbal 

## 2018-05-26 NOTE — Telephone Encounter (Signed)
Left detailed message on [name]'s voicemail that it is ok to proceed with orders as below.

## 2018-05-27 ENCOUNTER — Telehealth: Payer: Self-pay | Admitting: *Deleted

## 2018-05-27 NOTE — Telephone Encounter (Signed)
Received Episode Detail Report for Review from [facility]; forwarded to provider/SLS 11/12

## 2018-05-28 DIAGNOSIS — D631 Anemia in chronic kidney disease: Secondary | ICD-10-CM | POA: Diagnosis not present

## 2018-05-28 DIAGNOSIS — I5031 Acute diastolic (congestive) heart failure: Secondary | ICD-10-CM | POA: Diagnosis not present

## 2018-05-28 DIAGNOSIS — I132 Hypertensive heart and chronic kidney disease with heart failure and with stage 5 chronic kidney disease, or end stage renal disease: Secondary | ICD-10-CM | POA: Diagnosis not present

## 2018-05-28 DIAGNOSIS — N186 End stage renal disease: Secondary | ICD-10-CM | POA: Diagnosis not present

## 2018-05-28 DIAGNOSIS — Z950 Presence of cardiac pacemaker: Secondary | ICD-10-CM | POA: Diagnosis not present

## 2018-05-28 DIAGNOSIS — R531 Weakness: Secondary | ICD-10-CM | POA: Diagnosis not present

## 2018-06-02 DIAGNOSIS — R531 Weakness: Secondary | ICD-10-CM | POA: Diagnosis not present

## 2018-06-02 DIAGNOSIS — Z950 Presence of cardiac pacemaker: Secondary | ICD-10-CM | POA: Diagnosis not present

## 2018-06-02 DIAGNOSIS — I132 Hypertensive heart and chronic kidney disease with heart failure and with stage 5 chronic kidney disease, or end stage renal disease: Secondary | ICD-10-CM | POA: Diagnosis not present

## 2018-06-02 DIAGNOSIS — I5031 Acute diastolic (congestive) heart failure: Secondary | ICD-10-CM | POA: Diagnosis not present

## 2018-06-02 DIAGNOSIS — N186 End stage renal disease: Secondary | ICD-10-CM | POA: Diagnosis not present

## 2018-06-02 DIAGNOSIS — D631 Anemia in chronic kidney disease: Secondary | ICD-10-CM | POA: Diagnosis not present

## 2018-06-04 DIAGNOSIS — R531 Weakness: Secondary | ICD-10-CM | POA: Diagnosis not present

## 2018-06-04 DIAGNOSIS — I5031 Acute diastolic (congestive) heart failure: Secondary | ICD-10-CM | POA: Diagnosis not present

## 2018-06-04 DIAGNOSIS — D631 Anemia in chronic kidney disease: Secondary | ICD-10-CM | POA: Diagnosis not present

## 2018-06-04 DIAGNOSIS — N186 End stage renal disease: Secondary | ICD-10-CM | POA: Diagnosis not present

## 2018-06-04 DIAGNOSIS — I132 Hypertensive heart and chronic kidney disease with heart failure and with stage 5 chronic kidney disease, or end stage renal disease: Secondary | ICD-10-CM | POA: Diagnosis not present

## 2018-06-04 DIAGNOSIS — Z950 Presence of cardiac pacemaker: Secondary | ICD-10-CM | POA: Diagnosis not present

## 2018-06-09 ENCOUNTER — Other Ambulatory Visit: Payer: Self-pay | Admitting: Internal Medicine

## 2018-06-09 ENCOUNTER — Ambulatory Visit: Payer: Medicare Other | Admitting: Pulmonary Disease

## 2018-06-09 DIAGNOSIS — I5031 Acute diastolic (congestive) heart failure: Secondary | ICD-10-CM | POA: Diagnosis not present

## 2018-06-09 DIAGNOSIS — Z950 Presence of cardiac pacemaker: Secondary | ICD-10-CM | POA: Diagnosis not present

## 2018-06-09 DIAGNOSIS — I132 Hypertensive heart and chronic kidney disease with heart failure and with stage 5 chronic kidney disease, or end stage renal disease: Secondary | ICD-10-CM | POA: Diagnosis not present

## 2018-06-09 DIAGNOSIS — D631 Anemia in chronic kidney disease: Secondary | ICD-10-CM | POA: Diagnosis not present

## 2018-06-09 DIAGNOSIS — N390 Urinary tract infection, site not specified: Secondary | ICD-10-CM | POA: Diagnosis not present

## 2018-06-09 DIAGNOSIS — N186 End stage renal disease: Secondary | ICD-10-CM | POA: Diagnosis not present

## 2018-06-09 DIAGNOSIS — R531 Weakness: Secondary | ICD-10-CM | POA: Diagnosis not present

## 2018-06-10 DIAGNOSIS — D631 Anemia in chronic kidney disease: Secondary | ICD-10-CM | POA: Diagnosis not present

## 2018-06-10 DIAGNOSIS — I5031 Acute diastolic (congestive) heart failure: Secondary | ICD-10-CM | POA: Diagnosis not present

## 2018-06-10 DIAGNOSIS — I132 Hypertensive heart and chronic kidney disease with heart failure and with stage 5 chronic kidney disease, or end stage renal disease: Secondary | ICD-10-CM | POA: Diagnosis not present

## 2018-06-10 DIAGNOSIS — R531 Weakness: Secondary | ICD-10-CM | POA: Diagnosis not present

## 2018-06-10 DIAGNOSIS — N186 End stage renal disease: Secondary | ICD-10-CM | POA: Diagnosis not present

## 2018-06-10 DIAGNOSIS — Z950 Presence of cardiac pacemaker: Secondary | ICD-10-CM | POA: Diagnosis not present

## 2018-06-16 DIAGNOSIS — Z79899 Other long term (current) drug therapy: Secondary | ICD-10-CM | POA: Diagnosis not present

## 2018-06-16 DIAGNOSIS — N2581 Secondary hyperparathyroidism of renal origin: Secondary | ICD-10-CM | POA: Diagnosis not present

## 2018-06-16 DIAGNOSIS — R319 Hematuria, unspecified: Secondary | ICD-10-CM | POA: Diagnosis not present

## 2018-06-16 DIAGNOSIS — R809 Proteinuria, unspecified: Secondary | ICD-10-CM | POA: Diagnosis not present

## 2018-06-16 DIAGNOSIS — Z940 Kidney transplant status: Secondary | ICD-10-CM | POA: Diagnosis not present

## 2018-06-16 DIAGNOSIS — I770 Arteriovenous fistula, acquired: Secondary | ICD-10-CM | POA: Diagnosis not present

## 2018-06-16 DIAGNOSIS — N184 Chronic kidney disease, stage 4 (severe): Secondary | ICD-10-CM | POA: Diagnosis not present

## 2018-06-16 DIAGNOSIS — I129 Hypertensive chronic kidney disease with stage 1 through stage 4 chronic kidney disease, or unspecified chronic kidney disease: Secondary | ICD-10-CM | POA: Diagnosis not present

## 2018-06-16 DIAGNOSIS — M109 Gout, unspecified: Secondary | ICD-10-CM | POA: Diagnosis not present

## 2018-06-16 DIAGNOSIS — D899 Disorder involving the immune mechanism, unspecified: Secondary | ICD-10-CM | POA: Diagnosis not present

## 2018-06-16 DIAGNOSIS — D631 Anemia in chronic kidney disease: Secondary | ICD-10-CM | POA: Diagnosis not present

## 2018-06-17 ENCOUNTER — Ambulatory Visit: Payer: Self-pay

## 2018-06-17 ENCOUNTER — Telehealth: Payer: Self-pay

## 2018-06-17 NOTE — Telephone Encounter (Signed)
Author phoned pt. to relay Dr. Nonda Lou message. Pt. Stated, "I'm not going to the ED". Pt. stated she saw her kidney doctor yesterday, and asked again if Dr. Etter Sjogren had availability and Pryor Curia stated, "not until next week", and pt. Stated "alright, then I'll go see another doctor". Pt. Still has appointment on 12/9 with Dr. Etter Sjogren.

## 2018-06-17 NOTE — Telephone Encounter (Signed)
Pt c/o of moderate shortness of breath with exertion, cough, wheezing heard across the room. Symptoms started 4 days ago. Pt stated the SOB would  initially come and go but now is constant. Pt has been using her Albuterol every 4-6 hours that pt stated helps for a while.Pt has also been using  Symbicort twice an day. Pt has  chest pain only with coughing. Pt stated her chest feels sore. Pt stated she has a history of a pacemaker and atrial fibrillation. Pt stated she was diagnosed with asthma as a child. Pt is coughing up cloudy to white phlegm. Pt denies dizziness or fever.  Care advice given to pt and pt refused to go to the ED. Called PCP office and updated Raquel Sarna on pt triage and refusal to go to ED. Advised by Raquel Sarna to send pt to [facility]. Pt refused UCC as well. Routing note to office high priority Called office to inform Raquel Sarna that pt refused ED and UCC.   Reason for Disposition . Wheezing can be heard across the room . [1] MODERATE difficulty breathing (e.g., speaks in phrases, SOB even at rest, pulse 100-120) AND [2] NEW-onset or WORSE than normal    SOB with minimal exertion  Answer Assessment - Initial Assessment Questions 1. RESPIRATORY STATUS: "Describe your breathing?" (e.g., wheezing, shortness of breath, unable to speak, severe coughing)      Shortness of breath, wheezing, coughing 2. ONSET: "When did this breathing problem begin?"      4 days ago 3. PATTERN "Does the difficult breathing come and go, or has it been constant since it started?"      In the beginning it would come and go and now is constant 4. SEVERITY: "How bad is your breathing?" (e.g., mild, moderate, severe)    - MILD: No SOB at rest, mild SOB with walking, speaks normally in sentences, can lay down, no retractions, pulse < 100.    - MODERATE: SOB at rest, SOB with minimal exertion and prefers to sit, cannot lie down flat, speaks in phrases, mild retractions, audible wheezing, pulse 100-120.    - SEVERE: Very SOB at  rest, speaks in single words, struggling to breathe, sitting hunched forward, retractions, pulse > 120      moderate 5. RECURRENT SYMPTOM: "Have you had difficulty breathing before?" If so, ask: "When was the last time?" and "What happened that time?"      Yes- asthma asa a child 6. CARDIAC HISTORY: "Do you have any history of heart disease?" (e.g., heart attack, angina, bypass surgery, angioplasty)      Pacemaker, atrial fibrillation 7. LUNG HISTORY: "Do you have any history of lung disease?"  (e.g., pulmonary embolus, asthma, emphysema)     Dx with asthma when she was younger 80. CAUSE: "What do you think is causing the breathing problem?"      Change of weather 9. OTHER SYMPTOMS: "Do you have any other symptoms? (e.g., dizziness, runny nose, cough, chest pain, fever)     Cough, chest pain with cough (soreness) 10. PREGNANCY: "Is there any chance you are pregnant?" "When was your last menstrual period?"       n/a 11. TRAVEL: "Have you traveled out of the country in the last month?" (e.g., travel history, exposures)       no  Protocols used: BREATHING DIFFICULTY-A-AH

## 2018-06-17 NOTE — Telephone Encounter (Signed)
Please advise 

## 2018-06-17 NOTE — Telephone Encounter (Signed)
[name]--- I originally had 22-23 pt on my schedule today--- I agreed to "squeeze" in one already and this pt demanded to be seen today--- she was upset when I could not see her  And ask to transfer--- that is fine with me but I would like her to understand --esp since last call was 2pm and schedule is def full by then.

## 2018-06-17 NOTE — Telephone Encounter (Signed)
Please call pt and let her know I would really like her to go to the ER She has too many medical problems and can go bad quickly

## 2018-06-17 NOTE — Telephone Encounter (Signed)
Copied from [place] [id number] [phone]. Topic: Appointment Scheduling - Transfer of Care >> Jun 17, 2018  4:09 PM Alanda Slim E wrote: Pt is requesting to transfer FROM: Dr. Roma Schanz Pt is requesting to transfer TO: Wilfred Lacy  Reason for requested transfer: Pt requested to be seen at [facility] location (Pt would like to change her Monday 12.09.2019 appt to a TOC appt with Dr. Lorayne Marek / please advise

## 2018-06-17 NOTE — Telephone Encounter (Signed)
Ok

## 2018-06-18 ENCOUNTER — Telehealth: Payer: Self-pay | Admitting: *Deleted

## 2018-06-18 ENCOUNTER — Telehealth: Payer: Self-pay | Admitting: Family Medicine

## 2018-06-18 NOTE — Telephone Encounter (Signed)
Received Home Health Certification and Plan of Care; forwarded to provider/SLS 12/04

## 2018-06-18 NOTE — Telephone Encounter (Signed)
Author phoned pt., and appointment for 12/9 at 1030AM made with [name], NP.

## 2018-06-18 NOTE — Telephone Encounter (Signed)
Author phoned pt. to apologize for not having availability yesterday per Dr. Nonda Lou request. Author confirmed that pt. still wanted to transfer care, so appointment set up with [name] in [place] per [name] and Dr. Nonda Lou approval (see other encounter). Pt. Denied any other needs at this time.

## 2018-06-18 NOTE — Telephone Encounter (Signed)
Copied from [place] [id number]. Topic: Quick Communication - See Telephone Encounter >> Jun 18, 2018  9:53 AM Antonieta Iba C wrote: CRM for notification. See Telephone encounter for: 06/18/18.   Hilliard Clark PT w/ [facility]. - [phone]   Pt was scheduled a visit this morning. Pt called PT this morning and made him aware that she is having difficulty breathing due to her being asthmatic. Pt says that she dont feel up to PT today. Hilliard Clark is requesting to extend home health services   After Friday 2 week 2 and 1 week 1      After sending TE, called office and was advised by nurse that they have spoken with pt today and she didn't sound short of breath.

## 2018-06-18 NOTE — Telephone Encounter (Signed)
ok 

## 2018-06-19 NOTE — Telephone Encounter (Signed)
Verbal order given to Hilliard Clark to extend.

## 2018-06-20 ENCOUNTER — Ambulatory Visit: Payer: Self-pay | Admitting: *Deleted

## 2018-06-20 ENCOUNTER — Ambulatory Visit (INDEPENDENT_AMBULATORY_CARE_PROVIDER_SITE_OTHER): Payer: Medicare Other

## 2018-06-20 ENCOUNTER — Ambulatory Visit (INDEPENDENT_AMBULATORY_CARE_PROVIDER_SITE_OTHER): Payer: Medicare Other | Admitting: Family Medicine

## 2018-06-20 VITALS — BP 130/82 | HR 88 | Temp 98.1°F | Wt 182.0 lb

## 2018-06-20 DIAGNOSIS — Z950 Presence of cardiac pacemaker: Secondary | ICD-10-CM | POA: Diagnosis not present

## 2018-06-20 DIAGNOSIS — J181 Lobar pneumonia, unspecified organism: Secondary | ICD-10-CM | POA: Diagnosis not present

## 2018-06-20 DIAGNOSIS — R0602 Shortness of breath: Secondary | ICD-10-CM

## 2018-06-20 DIAGNOSIS — R531 Weakness: Secondary | ICD-10-CM | POA: Diagnosis not present

## 2018-06-20 DIAGNOSIS — R05 Cough: Secondary | ICD-10-CM | POA: Diagnosis not present

## 2018-06-20 DIAGNOSIS — D631 Anemia in chronic kidney disease: Secondary | ICD-10-CM | POA: Diagnosis not present

## 2018-06-20 DIAGNOSIS — J189 Pneumonia, unspecified organism: Secondary | ICD-10-CM

## 2018-06-20 DIAGNOSIS — N186 End stage renal disease: Secondary | ICD-10-CM | POA: Diagnosis not present

## 2018-06-20 DIAGNOSIS — I5031 Acute diastolic (congestive) heart failure: Secondary | ICD-10-CM | POA: Diagnosis not present

## 2018-06-20 DIAGNOSIS — I132 Hypertensive heart and chronic kidney disease with heart failure and with stage 5 chronic kidney disease, or end stage renal disease: Secondary | ICD-10-CM | POA: Diagnosis not present

## 2018-06-20 IMAGING — DX DG CHEST 2V
2 series · 2 of 2 positions shown · non-contrast
Comparison: Portable chest x-ray [DATE]

CLINICAL DATA: Shortness of breath for several days associated with
productive cough. History of CHF, kidney transplant, asthma,
previous episodes of pneumonia.

EXAM:
CHEST - 2 VIEW

[chest pa]
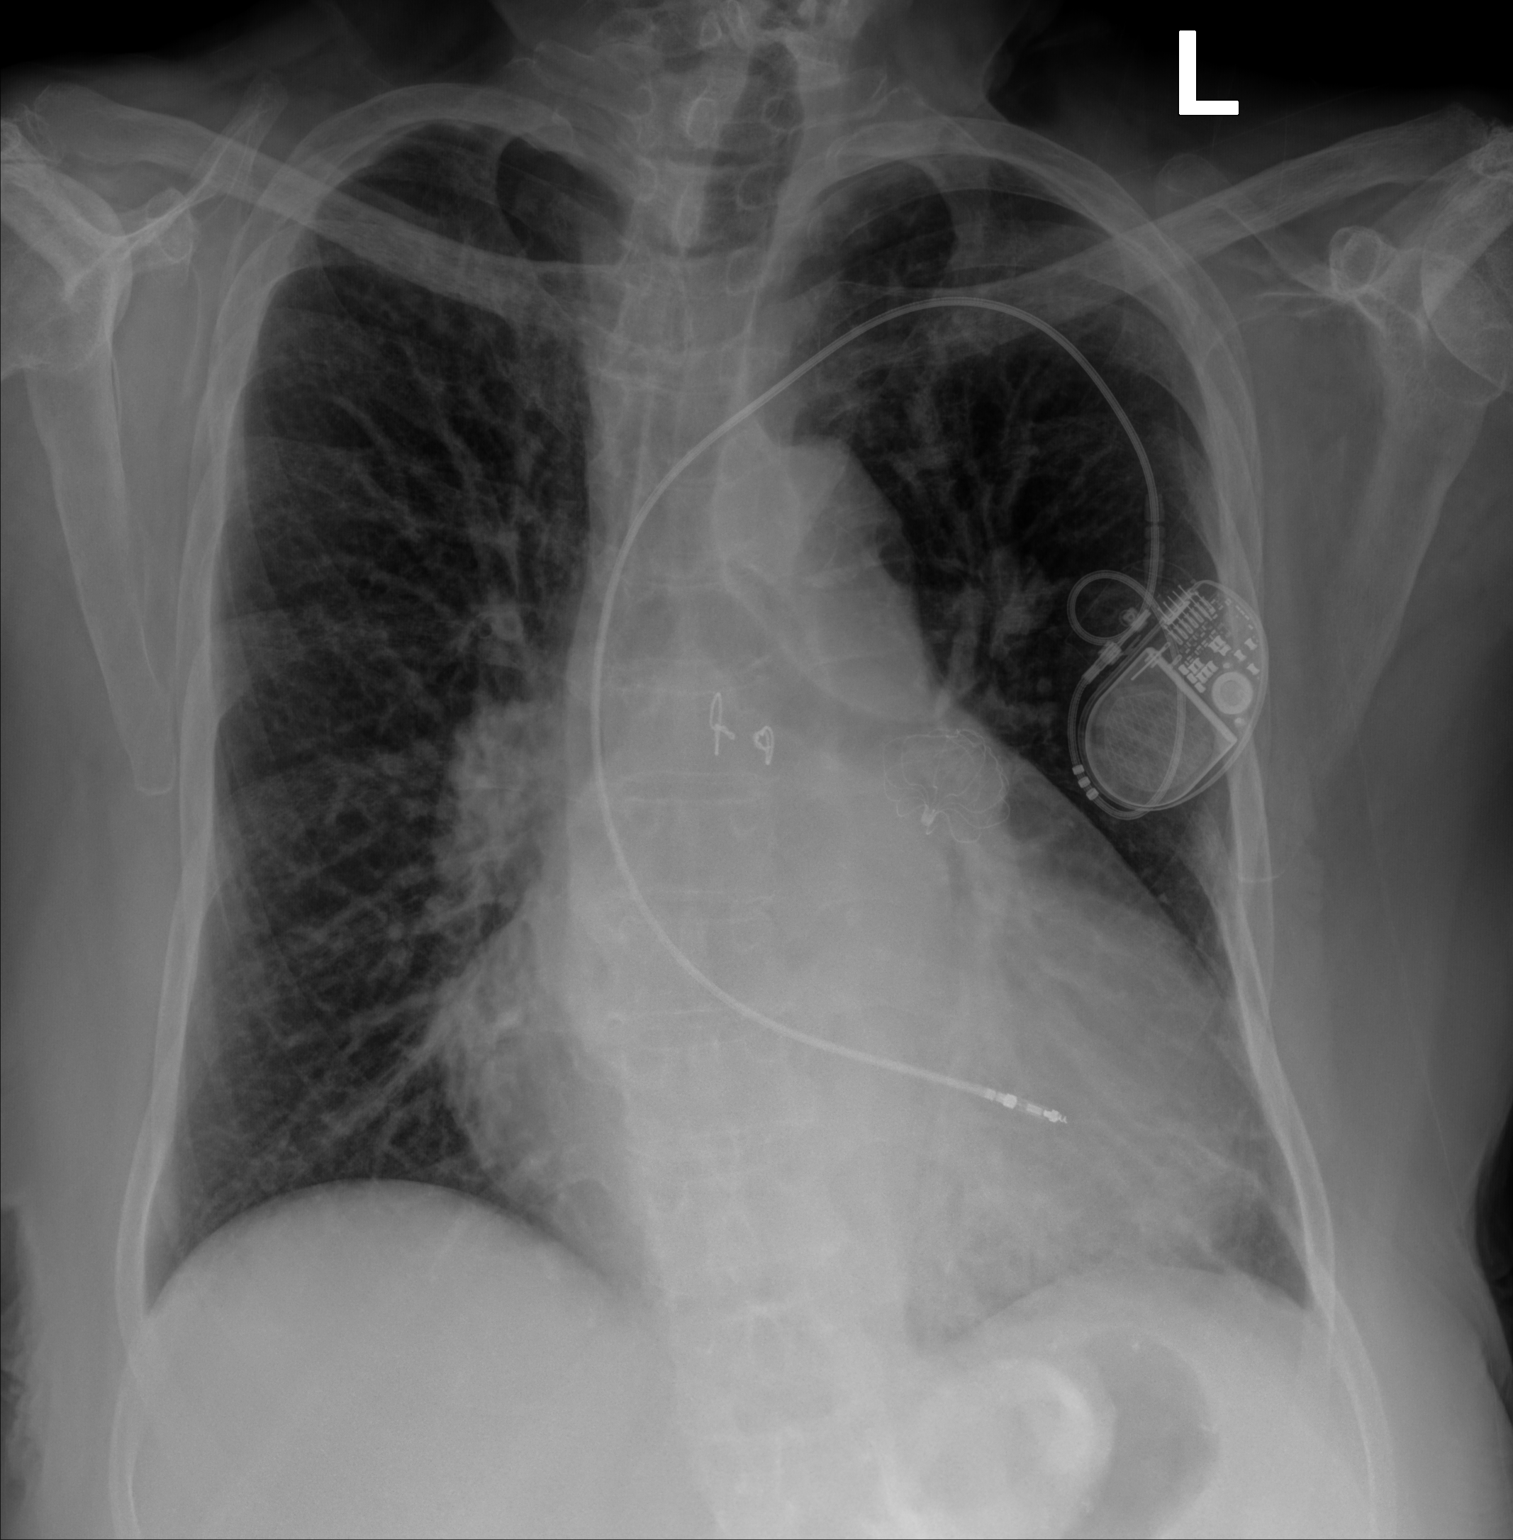

[chest lat]
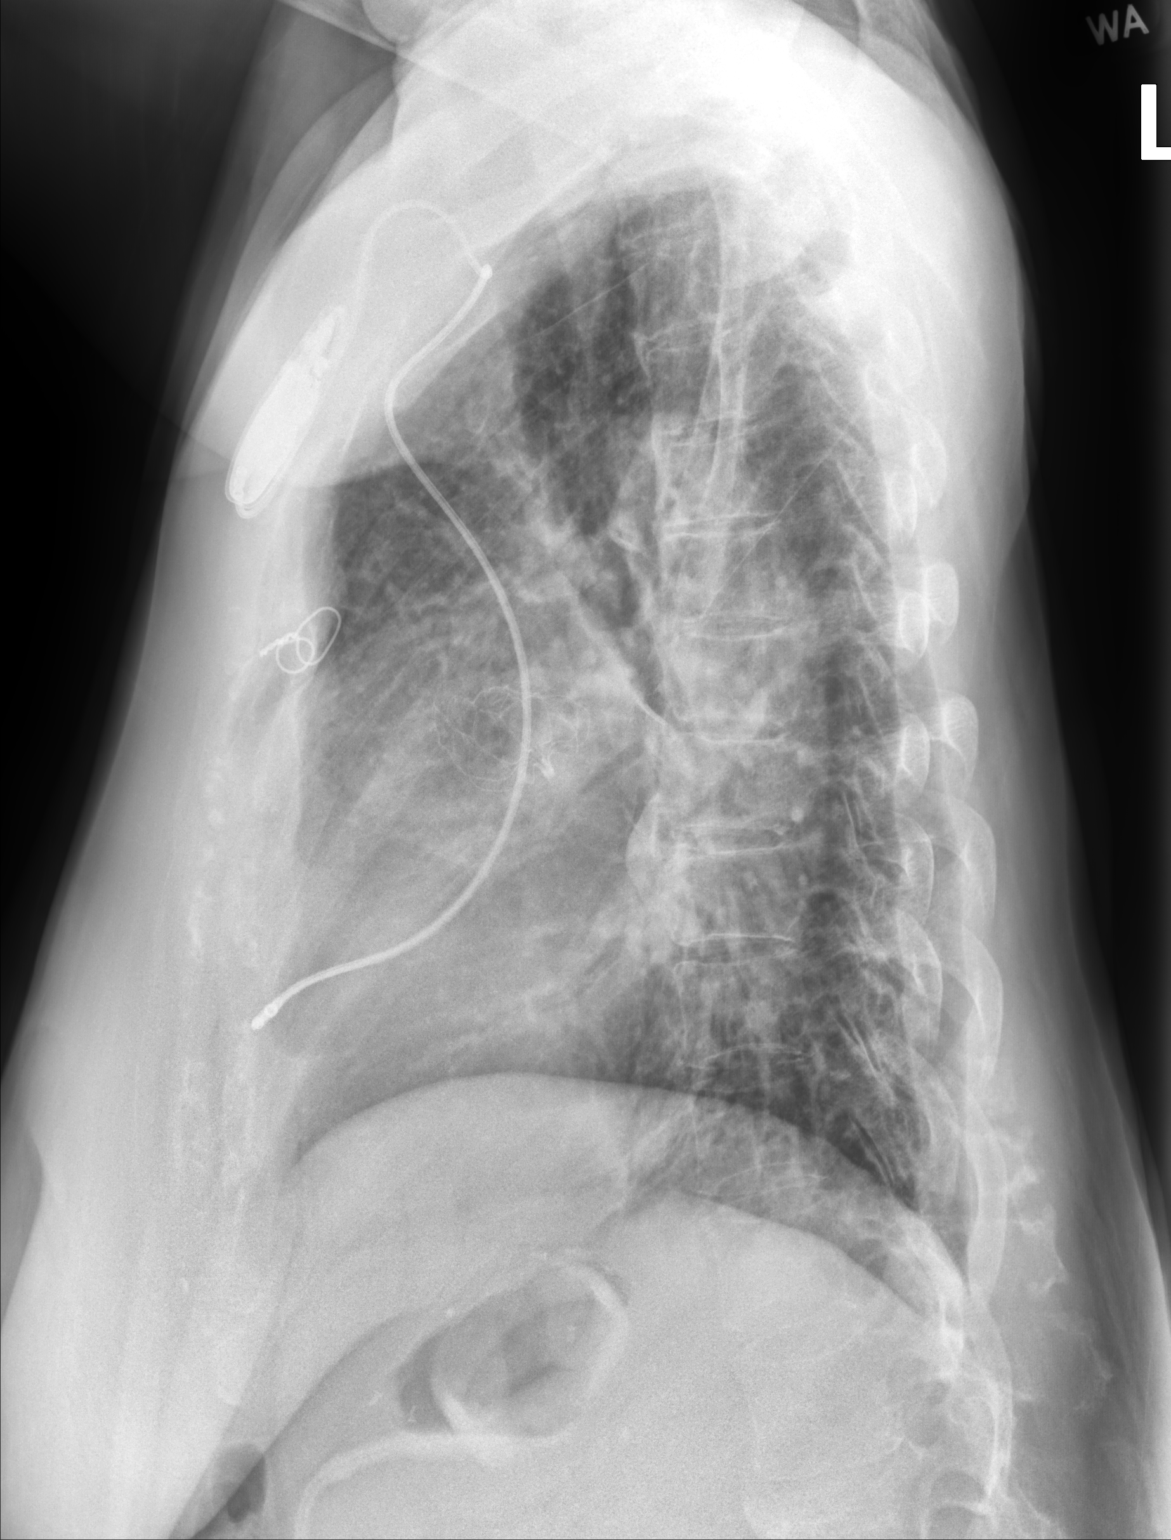

[2 of 2 positions shown; findings below may reference images not displayed]

FINDINGS: The lungs are mildly hyperinflated without hemidiaphragm flattening.
The cardiac silhouette is enlarged. The pulmonary vascularity is
mildly engorged. There is chronic prominence of the left suprahilar
soft tissues. The ICD is in reasonable position. There is a left
atrial appendage occlusion device present. There is calcification in
the wall of the aortic arch. The bony thorax exhibits no acute
abnormality.
IMPRESSION: CHF with mild interstitial edema. Underlying reactive airway
disease. No acute pneumonia.

Thoracic aortic atherosclerosis.

## 2018-06-20 NOTE — Telephone Encounter (Signed)
Initial call received by agent was from pt's physical therapist at home visit. P.T.stated pt SOB and needed to be seen earlier then 06/23/18 ,when pt has TOC appt with C. [name]. Transferred to NT. P.T. no longer at home, spoke with patient. Patient reports h/o asthma "As a child."  "Started again over past few years." States worsening SOB 1 week ago. Triaged for similar symptoms 06/17/18, refused disposition of ED at that time.  Pt reports presently "Not nearly as bad." Reports SOB with exertion only. Mild intermittent wheezing. Denies fever, CP. Does report productive cough with "Cloudy phlegm at times." States P.T. did check O2 sat, reports at 92% on RA. States is usually at 98% on room air.  Speech clear and non-halting during call. No audible wheezing. Pt states has been using inhalers and "treatment machine" as needed. TN called practice and spoke with [facility] regarding approval of acute appt prior to TOC.  Acute visit scheduled for this afternoon with Dr. Zigmund Daniel. Pt made aware she will still need to be seen Monday as scheduled for TOC to C. [name]. Pt verbalizes understanding. Care advise given per protocol.     Reason for Disposition . [1] MILD asthma attack (e.g., no SOB at rest, mild SOB with walking, speaks normally in sentences, mild wheezing) AND [2]  persists > 24 hours on appropriate treatment  Answer Assessment - Initial Assessment Questions 1. RESPIRATORY STATUS: "Describe your breathing?" (e.g., wheezing, shortness of breath, unable to speak, severe coughing)      Intermittent wheezing all week, not presently, SOB with exertion only 2. ONSET: "When did this asthma attack begin?"     1 week ago, "Not nearly as bad now" 3. TRIGGER: "What do you think triggered this attack?" (e.g., URI, exposure to pollen or other allergen, tobacco smoke)     Unsure 4. PEAK EXPIRATORY FLOW RATE (PEFR): "Do you use a peak flow meter?" If so, ask: "What's the current peak flow? What's your personal best peak  flow?"     no 5. SEVERITY: "How bad is this attack?"    - MILD: No SOB at rest, mild SOB with walking, speaks normally in sentences, can lay down, no retractions, pulse < 100. (GREEN Zone: PEFR 80-100%)   - MODERATE: SOB at rest, SOB with minimal exertion and prefers to sit, cannot lie down flat, speaks in phrases, mild retractions, audible wheezing, pulse 100-120. (YELLOW Zone: PEFR 50-80%)    - SEVERE: Very SOB at rest, speaks in single words, struggling to breathe, sitting hunched forward, retractions, usually loud wheezing, sometimes minimal wheezing because of decreased air movement, pulse > 120. (RED Zone: PEFR < 50%).      Mild, Exertion only 6. MEDICATIONS (Inhaler or nebs): "What are your asthma medications?" and "What treatments have you given so far?"    - Quick-relief: albuterol, metaproterenol, salbutamol, or other inhaled or nebulized beta-agonist medicines   - Long-term-control: steroids, cromolyn, or other anti-inflammatory medicines.     Inhalers and "treatment machine" 7. OTHER SYMPTOMS: "Do you have any other symptoms? (e.g., runny nose, chest pain, fever)     Cough at times, slight amount "Cloudy phlegm."  Protocols used: ASTHMA ATTACK-A-AH

## 2018-06-20 NOTE — Progress Notes (Signed)
HANG AMMON - 72 y.o. female MRN [id number]  Date of birth: [birth year]  Subjective Chief Complaint  Patient presents with  . Shortness of Breath    HPI [name] is a 72 y.o. female with history of CHF, a. Fib, DVT, and immunosuppression due to kidney transplant here today with complaint of shortness of breath.  Also has remote history of asthma as  Child.  Has had home PT coming out to work with her and they have noticed that she has been significantly short of breath with exertion.  Reports some improvement with albuterol.  She feels pretty good at rest.  She denies chest pain, increased LE edema, or orthopnea.  She has been compliant with current medications.  She has not noticed wheezing or fever.  She feels like her weight has been stable.  She did have a pacemaker implanted 04/2018.  ROS:  A comprehensive ROS was completed and negative except as noted per HPI    Allergies  Allergen Reactions  . Ace Inhibitors Other (See Comments)    Reaction unknown  . 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 Warfarin Sodium Other (See Comments)    Caused her to have a stroke    Past Medical History:  Diagnosis Date  . Acute diastolic (congestive) heart failure ([place]) 08/12/2017  . AKI (acute kidney injury) ([place]) 11/04/2017  . Anemia   . Anxiety   . Arthritis    "shoulders; arms; hips" (02/04/2018)  . Asthma   . Atrial fibrillation ([place])   . Atrial flutter [facility])    s/p aflutter ablation at [facility]  . Benign hypertension with ESRD (end-stage renal disease) ([place])   . Blood transfusion    never had a reaction to blood transfusion  . Cardiomyopathy Mar 2013   Mild, EF 50-55% by Mar 2013 ECHO, diast dysfxn II  . Cardiomyopathy ([place])   . CHF (congestive heart failure) ([place]) 2005  . CHF (congestive heart failure) ([place])   . Childhood asthma   . Closed fracture of right distal radius 10/18/2016  . CMV (cytomegalovirus infection)  ([place]) 10/03/2015  . Constipation    takes Miralax daily  . Constipation   . Depression    takes Zoloft daily  . Distal radius fracture, right   . DVT of lower extremity, bilateral ([place]) 12/21/11   "they're there now; been there for 2 wks"  . Eczema   . ESRD (end stage renal disease) on dialysis [facility]) 09/2011   07-19-2015 had Kidney transplant at [facility]; "don't get dialysis anymore" (02/04/2018)  . Fractures, stress    in both feet--6 OR 7 YRS AGO--RESOLVED  . Generalized edema [id number]  . Gout    doesn't require meds   . HCAP (healthcare-associated pneumonia) 09/10/2011  . Hearing difficulty   . High cholesterol   . History of CVA (cerebrovascular accident) without residual deficits   . History of hip replacement, total, right   . History of pneumonia   . Hx of Clostridium difficile infection   . Hypercalcemia    09/10/11  . Hyperparathyroidism ([place]) 04/07/2013  . Hypertension    takes Diltiazem daily   . Hypomagnesemia 08/02/2015  . Hypophosphatemia 11/08/2017  . ICB (intracranial bleed) ([place])   . Immunosuppression ([place]) 07/25/2015  . Memory changes   . Morbid obesity ([place])   . Nonischemic dilated cardiomyopathy ([place])   . Obesity 04/07/2013  . Oligouria   . OSA on  CPAP   . PAF (paroxysmal atrial fibrillation) ([facility])     HX OF CEREBRAL BLEED WHILE ON COUMADIN-SO PT NOT ON ANY BLOOD THINNERS NOW  . Peripheral vascular disease ([place])   . Presence of Watchman left atrial appendage closure device 10/04/2017  . Proteinuria 09/04/2016  . Psoriasis 08/07/2016  . 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 S/P insertion of IVC (inferior vena caval) filter   . Sepsis ([place]) 11/08/2017  . Stress fracture    bilateral feet  . Stroke [facility]) 2009   denies residual on 02/04/2018;  hemorrhagic now off coumadin  . Stroke due to intracerebral hemorrhage ([place]) 2009  . Suture reaction 09/04/2016  . Transfusion history   . Use of cane as ambulatory aid     Past Surgical History:  Procedure  Laterality Date  . AV FISTULA PLACEMENT  09/28/2011   Procedure: ARTERIOVENOUS (AV) FISTULA CREATION;  Surgeon: Rosetta Posner, MD;  Location: [place];  Service: Vascular;  Laterality: Left;  . CARDIAC CATHETERIZATION    . CARDIAC VALVE SURGERY  1960    FOR ATRIAL SEPTAL DEFECT  . CHOLECYSTECTOMY     1980's  . COLONOSCOPY    . CYSTOSCOPY     many yrs ago  . FEMUR FRACTURE SURGERY Right 03/2011; 09/03/2011   "had 2, 2 wk apart in 2012; broke it again 08/2011 & had OR"  . FRACTURE SURGERY    . I&D EXTREMITY  09/15/2011   Procedure: IRRIGATION AND DEBRIDEMENT EXTREMITY w REMOVAL OF HARDWARE;  Surgeon: [name], MD;  Location: [place];  Service: Orthopedics;  Laterality: Right;  . INCISION AND DRAINAGE HIP  12/21/2011   Procedure: IRRIGATION AND DEBRIDEMENT HIP;  Surgeon: [name], MD;  Location: [place];  Service: Orthopedics;  Laterality: Right;  I&D RIGHT HIP WITH PLACEMENT ANTIBIOTIC SPACER  . INSERTION OF DIALYSIS CATHETER     Procedure: INSERTION OF DIALYSIS CATHETER;  Surgeon: Rosetta Posner, MD;  Location: [place];  Service: Vascular;  Laterality: Right;  . IRRIGATION AND DEBRIDEMENT ABSCESS  12/21/11   right hip  . JOINT REPLACEMENT    . KIDNEY TRANSPLANT  07/19/15  . LAPAROSCOPIC GASTRIC BANDING  2008  . LEFT ATRIAL APPENDAGE OCCLUSION  10/04/2017  . OPEN REDUCTION INTERNAL FIXATION (ORIF) DISTAL RADIAL FRACTURE Right 10/18/2016   Procedure: OPEN REDUCTION INTERNAL FIXATION (ORIF) RIGHT DISTAL RADIAL FRACTURE;  Surgeon: Marchia Bond, MD;  Location: [place];  Service: Orthopedics;  Laterality: Right;  . TOTAL HIP ARTHROPLASTY Right 02/2011   right THA 02/2011, I&D/removal of hardware 09/2011,, repeat I&D Jun 2013, reimplantation R THA 03-26-2012  . TOTAL HIP REVISION  03/25/2012   Procedure: TOTAL HIP REVISION;  Surgeon: [name], MD;  Location: [facility];  Service: Orthopedics;  Laterality: Right;  Right Total Hip Reimplantation  . TUBAL LIGATION    . VENA CAVA FILTER  PLACEMENT  11/2011    Social History   Socioeconomic History  . Marital status: Married    Spouse name: Not on file  . Number of children: Not on file  . Years of education: Not on file  . Highest education level: Not on file  Occupational History  . Occupation: retired  Scientific laboratory technician  . Financial resource strain: Not on file  . Food insecurity:    Worry: Not on file    Inability: Not on file  . Transportation needs:    Medical: Not on file    Non-medical: Not on file  Tobacco  Use  . Smoking status: Never Smoker  . Smokeless tobacco: Never Used  Substance and Sexual Activity  . Alcohol use: Never    Frequency: Never  . Drug use: Never  . Sexual activity: Yes    Birth control/protection: Post-menopausal  Lifestyle  . Physical activity:    Days per week: Not on file    Minutes per session: Not on file  . Stress: Not on file  Relationships  . Social connections:    Talks on phone: Not on file    Gets together: Not on file    Attends religious service: Not on file    Active member of club or organization: Not on file    Attends meetings of clubs or organizations: Not on file    Relationship status: Not on file  Other Topics Concern  . Not on file  Social History Narrative   Married; retired; lives in [place]    Family History  Problem Relation Age of Onset  . Cancer Father   . Diabetes Mother   . Hypertension Mother   . Arthritis Mother   . Hodgkin's lymphoma Unknown 32       dscd---HODGKINS DISEASE  . Hypertension Brother     Health Maintenance  Topic Date Due  . COLONOSCOPY  07/24/2017  . INFLUENZA VACCINE  02/13/2018  . MAMMOGRAM  05/15/2019  . TETANUS/TDAP  05/09/2023  . DEXA SCAN  Completed  . Hepatitis C Screening  Completed  . PNA vac Low Risk Adult  Completed     ----------------------------------------------------------------------------------------------------------------------------------------------------------------------------------------------------------------- Physical Exam BP 130/82   Pulse 88   Temp 98.1 F (36.7 C)   SpO2 95%   Physical Exam  Constitutional: She is oriented to person, place, and time. She appears well-nourished. No distress.  HENT:  Head: Normocephalic and atraumatic.  Mouth/Throat: Oropharynx is clear and moist.  Eyes: No scleral icterus.  Neck: Neck supple.  Cardiovascular: Normal rate, regular rhythm and normal heart sounds.  Pulmonary/Chest:  Diminished breath sounds with crackles along the LLL.  Lymphadenopathy:    She has no cervical adenopathy.  Neurological: She is alert and oriented to person, place, and time.  Skin: Skin is warm and dry.  Psychiatric: She has a normal mood and affect. Her behavior is normal.    ------------------------------------------------------------------------------------------------------------------------------------------------------------------------------------------------------------------- Assessment and Plan  Community acquired pneumonia of left lower lobe of lung ([place]) -Exam consistent with pneumonia.  -CXR ordered -Will start cepodoxime and azithromycin given immunocompromised state and co-morbidities -Discussed if having worsening symptoms she should be seen in ER.

## 2018-06-21 ENCOUNTER — Encounter: Payer: Self-pay | Admitting: Family Medicine

## 2018-06-21 MED ORDER — CEFPODOXIME PROXETIL 200 MG PO TABS: 200.0000 mg | ORAL_TABLET | Freq: Two times a day (BID) | ORAL | 0 refills | Status: AC

## 2018-06-21 MED ORDER — AZITHROMYCIN 250 MG PO TABS: ORAL_TABLET | ORAL | 0 refills | Status: DC

## 2018-06-21 NOTE — Assessment & Plan Note (Signed)
-  Exam consistent with pneumonia.  -CXR ordered -Will start cepodoxime and azithromycin given immunocompromised state and co-morbidities -Discussed if having worsening symptoms she should be seen in ER.

## 2018-06-23 ENCOUNTER — Encounter: Payer: Medicare Other | Admitting: Nurse Practitioner

## 2018-06-23 ENCOUNTER — Ambulatory Visit: Payer: Medicare Other | Admitting: Family Medicine

## 2018-06-23 ENCOUNTER — Encounter: Payer: Self-pay | Admitting: Nurse Practitioner

## 2018-06-23 ENCOUNTER — Ambulatory Visit (INDEPENDENT_AMBULATORY_CARE_PROVIDER_SITE_OTHER): Payer: Medicare Other | Admitting: Nurse Practitioner

## 2018-06-23 ENCOUNTER — Encounter

## 2018-06-23 VITALS — BP 138/82 | HR 87 | Temp 98.6°F | Ht 65.0 in | Wt 176.0 lb

## 2018-06-23 DIAGNOSIS — I5023 Acute on chronic systolic (congestive) heart failure: Secondary | ICD-10-CM | POA: Diagnosis not present

## 2018-06-23 DIAGNOSIS — J189 Pneumonia, unspecified organism: Secondary | ICD-10-CM

## 2018-06-23 DIAGNOSIS — J181 Lobar pneumonia, unspecified organism: Secondary | ICD-10-CM

## 2018-06-23 NOTE — Assessment & Plan Note (Signed)
Stable, no fever. Start oral abx yesterday Euvolemic. F/up in 1week, repeat CXR

## 2018-06-23 NOTE — Assessment & Plan Note (Addendum)
Appears euvolemic today. Wt Readings from Last 3 Encounters:  06/23/18 176 lb (79.8 kg)  06/20/18 182 lb (82.6 kg)  03/24/18 194 lb 3.2 oz (88.1 kg)   F/up in 1week Maintain furosemide as prescribed by nephrology. Repeat CXR, BMP and BNP in 1week

## 2018-06-23 NOTE — Progress Notes (Signed)
Subjective:  Patient ID: [name], female    DOB: 03-18-1946  Age: 72 y.o. MRN: [id number]  CC: Establish Care (Transfer from Dr. Etter Sjogren pt complaining of coughing,still taking 2 med. )  HPI  CAP and/or acute CHF exacerbation: Onset of cough, edema, SOB 1week ago evaluated by Dr. Jimmy Footman: Nephrology with [facility], Last seen 06/16/2018. Furosemide dose increased to 80mg  BID. Last OV with cardiology 01/2018. Pacemaker inserted 04/2018. Last echocardiogram 03/2018: EF 94-49%, systolic dysfuction Started oral abx yesterday: Vantin and azithromycin. Reports Persistent cough, SOB, PND, and LE edema. No fever. 6lbs weight loss noted since Fridays. Wt Readings from Last 3 Encounters:  06/23/18 176 lb (79.8 kg)  06/20/18 182 lb (82.6 kg)  03/24/18 194 lb 3.2 oz (88.1 kg)   Reviewed lab results done by nephrology 06/16/2018: BUN/Creat 16/1.57, GFr 39, Na 138, K 3.8, Cl 99, Phos 3.5, Ca 10.2, Albumin 3.7.  Reviewed past Medical, Social and Family history today.  Outpatient Medications Prior to Visit  Medication Sig Dispense Refill  . acetaminophen (TYLENOL) 500 MG tablet Take 500 mg by mouth every 6 (six) hours as needed.    Marland Kitchen albuterol (PROVENTIL HFA;VENTOLIN HFA) 108 (90 Base) MCG/ACT inhaler Inhale 2 puffs into the lungs every 6 (six) hours as needed for wheezing or shortness of breath. 18 g 5  . aspirin EC 81 MG tablet Take 81 mg by mouth.    Marland Kitchen azithromycin (ZITHROMAX) 250 MG tablet Take 2 tabs on day then 1 tab daily for days 2-5 6 tablet 0  . budesonide-formoterol (SYMBICORT) 80-4.5 MCG/ACT inhaler Inhale 2 puffs into the lungs 2 (two) times daily. 1 Inhaler 0  . cefpodoxime (VANTIN) 200 MG tablet Take 1 tablet (200 mg total) by mouth 2 (two) times daily for 14 days. 28 tablet 0  . clopidogrel (PLAVIX) 75 MG tablet Take 75 mg by mouth daily.    Marland Kitchen docusate sodium (COLACE) 100 MG capsule Take 100 mg by mouth daily as needed (For constipation.).     Marland Kitchen  furosemide (LASIX) 80 MG tablet Take 80 mg by mouth 2 (two) times daily.    . Magnesium 400 MG TABS Take by mouth 2 (two) times daily.    Earley Abide Compounding Base (MOUTH WASH-GP PO) Take by mouth. MUCOSITIS MOUTH WASH-Give 65mls by mouth 4 times daily for 10 days for oral mucositis    . mycophenolate (MYFORTIC) 180 MG EC tablet Take 180 mg by mouth 2 (two) times daily.    . NONFORMULARY OR COMPOUNDED ITEM Compression stockings --thigh high  20-30 mm hg Dx  Low ext edema 1 each 0  . potassium chloride SA (K-DUR,KLOR-CON) 20 MEQ tablet Take 20 mEq by mouth.     . predniSONE (DELTASONE) 5 MG tablet Take 5 mg by mouth daily with breakfast.    . sertraline (ZOLOFT) 50 MG tablet Take 1 tablet (50 mg total) by mouth daily. 90 tablet 3  . tacrolimus (PROGRAF) 1 MG capsule Take 2 mg by mouth daily. 2 mg po q am and 1 mg po q HS    . metoprolol tartrate (LOPRESSOR) 25 MG tablet Take 25 mg by mouth. TAKE 1 TABLET BY MOUTH 4 TIMES A DAY FOR AFIB     No facility-administered medications prior to visit.     ROS See HPI  Objective:  BP 138/82   Pulse 87   Temp 98.6 F (37 C) (Oral)   Ht 5\' 5"  (1.651 m)   Wt 176 lb (79.8  kg)   SpO2 96%   BMI 29.29 kg/m   BP Readings from Last 3 Encounters:  06/23/18 138/82  06/20/18 130/82  03/24/18 131/79    Wt Readings from Last 3 Encounters:  06/23/18 176 lb (79.8 kg)  06/20/18 182 lb (82.6 kg)  03/24/18 194 lb 3.2 oz (88.1 kg)    Physical Exam  Cardiovascular: Normal rate. An irregularly irregular rhythm present.  Pulmonary/Chest: Effort normal. No respiratory distress. She has no wheezes. She has rales. She exhibits no tenderness.  Diminished lung sounds and rales in LLL only  Musculoskeletal: She exhibits edema.  Vitals reviewed.   Lab Results  Component Value Date   WBC 9.8 03/19/2018   HGB 11.7 (A) 03/19/2018   HCT 36 03/19/2018   PLT 156 03/19/2018   GLUCOSE 89 02/05/2018   CHOL  11/16/2008    184        ATP III  CLASSIFICATION:  <200     mg/dL   Desirable  200-239  mg/dL   Borderline High  >=240    mg/dL   High          TRIG 157 (H) 09/15/2011   HDL 33 (L) 11/16/2008   LDLCALC (H) 11/16/2008    130        Total Cholesterol/HDL:CHD Risk Coronary Heart Disease Risk Table                     Men   Women  1/2 Average Risk   3.4   3.3  Average Risk       5.0   4.4  2 X Average Risk   9.6   7.1  3 X Average Risk  23.4   11.0        Use the calculated Patient Ratio above and the CHD Risk Table to determine the patient's CHD Risk.        ATP III CLASSIFICATION (LDL):  <100     mg/dL   Optimal  100-129  mg/dL   Near or Above                    Optimal  130-159  mg/dL   Borderline  160-189  mg/dL   High  >190     mg/dL   Very High   ALT 57 (H) 02/03/2018   AST 76 (H) 02/03/2018   NA 143 03/19/2018   K 2.9 (A) 03/19/2018   CL 99 02/05/2018   CREATININE 1.7 (A) 03/19/2018   BUN 15 03/19/2018   CO2 24 02/05/2018   TSH 2.655 09/11/2011   INR 1.90 11/03/2017   HGBA1C  11/16/2008    5.5 (NOTE) The ADA recommends the following therapeutic goal for glycemic control related to Hgb A1c measurement: Goal of therapy: <6.5 Hgb A1c  Reference: American Diabetes Association: Clinical Practice Recommendations 2010, Diabetes Care, 2010, 33: (Suppl  1).    US Renal Transplant W/doppler  Result Date: 02/03/2018 CLINICAL DATA:  Acute renal failure. EXAM: ULTRASOUND OF RENAL TRANSPLANT WITH RENAL DOPPLER ULTRASOUND TECHNIQUE: Ultrasound examination of the renal transplant was performed with gray-scale, color and duplex doppler evaluation. Technologist describes technically difficult study with limited vascular evaluation attributed to body habitus and overlying bowel gas. COMPARISON:  None. FINDINGS: Transplant kidney location: Right lower quadrant Transplant Kidney: Length: 11 cm. Normal in size and parenchymal echogenicity. No evidence of mass or hydronephrosis. No peri-transplant fluid collection seen.  Color flow in the main renal artery:  Limited evaluation Color  flow in the main renal vein:  Limited evaluation Duplex Doppler Evaluation: Main Renal Artery Resistive Index: Not obtained Venous waveform in main renal vein:  Not demonstrated Intrarenal resistive index in upper pole:  Not obtained (normal 0.6-0.8; equivocal 0.8-0.9; abnormal >= 0.9) Intrarenal resistive index in lower pole: Not obtained (normal 0.6-0.8; equivocal 0.8-0.9; abnormal >= 0.9) Bladder: Normal for degree of bladder distention. Unilateral ureteral jet documented. Other findings: There is a 8.9 x 5.5 x 6.3 cm deep subcutaneous fluid collection superior and lateral to the transplant kidney without significant mass effect upon the kidney or collecting system. IMPRESSION: 1. No hydronephrosis of transplant kidney. Limited vascular evaluation. 2. 8.9 cm deep subcutaneous fluid collection superior and lateral to the transplant kidney without significant mass effect on the kidney. Electronically Signed   By: Lucrezia Europe M.D.   On: 02/03/2018 21:52   Dg Chest Portable 1 View  Result Date: 02/03/2018 CLINICAL DATA:  Chest pain. Shortness of breath. Generalized weakness. EXAM: PORTABLE CHEST 1 VIEW COMPARISON:  Two-view chest x-ray 11/03/2017. FINDINGS: Heart is enlarged. Defibrillator pad is in place. Lung volumes are low. Mild pulmonary vascular congestion is present. No focal airspace disease is evident. IMPRESSION: 1. Cardiomegaly and mild pulmonary vascular congestion without frank edema. Early congestive heart failure is not excluded. 2. No focal airspace disease. Electronically Signed   By: San Morelle M.D.   On: 02/03/2018 16:16    Assessment & Plan:   Terina was seen today for establish care.  Diagnoses and all orders for this visit:  Community acquired pneumonia of left lower lobe of lung ([place])  Acute on chronic systolic heart failure ([place])   I am having Mariann Laster T. Ishee maintain her docusate sodium, aspirin EC,  mycophenolate, albuterol, predniSONE, acetaminophen, furosemide, NONFORMULARY OR COMPOUNDED ITEM, potassium chloride SA, Magnesium, Mouthwash Compounding Base (MOUTH WASH-GP PO), clopidogrel, tacrolimus, budesonide-formoterol, metoprolol tartrate, sertraline, cefpodoxime, and azithromycin.  No orders of the defined types were placed in this encounter.   Problem List Items Addressed This Visit      Cardiovascular and Mediastinum   Acute on chronic systolic heart failure ([place])    Appears euvolemic today. Wt Readings from Last 3 Encounters:  06/23/18 176 lb (79.8 kg)  06/20/18 182 lb (82.6 kg)  03/24/18 194 lb 3.2 oz (88.1 kg)   F/up in 1week Maintain furosemide as prescribed by nephrology. Repeat CXR, BMP and BNP in 1week        Respiratory   Community acquired pneumonia of left lower lobe of lung (HCC) - Primary    Stable, no fever. Start oral abx yesterday Euvolemic. F/up in 1week, repeat CXR         Follow-up: Return in about 1 week (around 06/30/2018) for pneumonia and depression (35mins).  Wilfred Lacy, NP

## 2018-06-23 NOTE — Patient Instructions (Addendum)
Continue current medications.  I will collaborate with nephrology about possibly changing furosemide to torsemide. Left voice message with nephrology group. Will call you back and possible medication change.  Please reach out to cardiology to make appt for CHF exacerbation.

## 2018-06-25 DIAGNOSIS — I132 Hypertensive heart and chronic kidney disease with heart failure and with stage 5 chronic kidney disease, or end stage renal disease: Secondary | ICD-10-CM | POA: Diagnosis not present

## 2018-06-25 DIAGNOSIS — R531 Weakness: Secondary | ICD-10-CM | POA: Diagnosis not present

## 2018-06-25 DIAGNOSIS — I5031 Acute diastolic (congestive) heart failure: Secondary | ICD-10-CM | POA: Diagnosis not present

## 2018-06-25 DIAGNOSIS — Z950 Presence of cardiac pacemaker: Secondary | ICD-10-CM | POA: Diagnosis not present

## 2018-06-25 DIAGNOSIS — N186 End stage renal disease: Secondary | ICD-10-CM | POA: Diagnosis not present

## 2018-06-25 DIAGNOSIS — D631 Anemia in chronic kidney disease: Secondary | ICD-10-CM | POA: Diagnosis not present

## 2018-06-26 ENCOUNTER — Ambulatory Visit (INDEPENDENT_AMBULATORY_CARE_PROVIDER_SITE_OTHER): Payer: Medicare Other | Admitting: Pulmonary Disease

## 2018-06-26 ENCOUNTER — Encounter: Payer: Self-pay | Admitting: Pulmonary Disease

## 2018-06-26 DIAGNOSIS — J4531 Mild persistent asthma with (acute) exacerbation: Secondary | ICD-10-CM

## 2018-06-26 DIAGNOSIS — J189 Pneumonia, unspecified organism: Secondary | ICD-10-CM

## 2018-06-26 DIAGNOSIS — Z9229 Personal history of other drug therapy: Secondary | ICD-10-CM

## 2018-06-26 DIAGNOSIS — J181 Lobar pneumonia, unspecified organism: Secondary | ICD-10-CM | POA: Diagnosis not present

## 2018-06-26 MED ORDER — BUDESONIDE-FORMOTEROL FUMARATE 80-4.5 MCG/ACT IN AERO: 2.0000 | INHALATION_SPRAY | Freq: Two times a day (BID) | RESPIRATORY_TRACT | 6 refills | Status: DC

## 2018-06-26 NOTE — Assessment & Plan Note (Signed)
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 Will reassess need for high-resolution CT on next visit

## 2018-06-26 NOTE — Assessment & Plan Note (Signed)
Not convinced that she had pneumonia, chest x-ray does not show any clear infiltrate. Anyways she is better does not need more antibiotics

## 2018-06-26 NOTE — Addendum Note (Signed)
Addended by: Valerie Salts on: 06/26/2018 10:12 AM   Modules accepted: Orders

## 2018-06-26 NOTE — Assessment & Plan Note (Signed)
Doubt true asthma but given subjective benefit, will continue Symbicort 80 for another 6 months then reassess need

## 2018-06-26 NOTE — Patient Instructions (Signed)
Refills on Symbicort as needed

## 2018-06-26 NOTE — Progress Notes (Signed)
Subjective:    Patient ID: [name], female    DOB: 12-25-1945, 72 y.o.   MRN: [id number]  HPI  72 year old never smoker for FU of chronic cough  & 'asthma'+ and amiodarone induced ILD She is a renal transplant recipient in 07/2015, end-stage renal disease on dialysis since 2013, history of ICH in 2010 on warfarin and history of DVT/PE status post IVC filter.  She has heart failure with preserved EF and atrial fibrillation status post ablation in 2017. She was evaluated by her PCP in October 2018 and told that she may have asthma. She reports asthma as a child but this never bothered her as an adult.  She was hospitalized 1/31 for atrial for ablation with RVR and an exacerbation of acute diastolic heart failure. Respiratory viral panel was positive for coronavirus.  01/2018 AKI due to supratherapeutic tacrolimus, required transient CRRT at [facility]  03/2018 underwent  permanent pacemaker implant and AV node ablation , amio was stopped  She continues to have cough and was seen a week ago and was given Cefpodoxime and azithromycin.  Cough is improved. I reviewed chest x-ray from 12/10 which shows bilateral interstitial prominence with no evidence of pneumonia. She feels that Symbicort has subjectively helped her denies obvious wheezing  Significant tests/ events reviewed  Spirometry 09/2017 significant restriction with FVC 25%, FEV1 28%. Ratio 87. Decreased mid flows.   PFTs 11/2017-moderate restriction, FVC 53%, TLC 55%, DLCO 43%, no obstruction  HRCT 11/2017-mild patchy subpleural reticulation and groundglass bilateral lungs without significant traction bronchiectasis or frank honeycombing.  Findings nonspecific may represent mild postinfectious/inflammatory changes.  Possibility of ILD such as NSIP or early UIP cannot be excluded but is not favored  Past Medical History:  Diagnosis Date  . Acute diastolic (congestive) heart failure ([place]) 08/12/2017  . AKI (acute  kidney injury) ([place]) 11/04/2017  . Anemia   . Anxiety   . Arthritis    "shoulders; arms; hips" (02/04/2018)  . Asthma   . Atrial fibrillation ([place])   . Atrial flutter [facility])    s/p aflutter ablation at [facility]  . Benign hypertension with ESRD (end-stage renal disease) ([place])   . Blood transfusion    never had a reaction to blood transfusion  . Cardiomyopathy Mar 2013   Mild, EF 50-55% by Mar 2013 ECHO, diast dysfxn II  . Cardiomyopathy ([place])   . CHF (congestive heart failure) ([place]) 2005  . CHF (congestive heart failure) ([place])   . Childhood asthma   . Closed fracture of right distal radius 10/18/2016  . CMV (cytomegalovirus infection) ([place]) 10/03/2015  . Constipation    takes Miralax daily  . Constipation   . Depression    takes Zoloft daily  . Distal radius fracture, right   . DVT of lower extremity, bilateral ([place]) 12/21/11   "they're there now; been there for 2 wks"  . Eczema   . ESRD (end stage renal disease) on dialysis [facility]) 09/2011   07-19-2015 had Kidney transplant at [facility]; "don't get dialysis anymore" (02/04/2018)  . Fractures, stress    in both feet--6 OR 7 YRS AGO--RESOLVED  . Generalized edema [id number]  . Gout    doesn't require meds   . HCAP (healthcare-associated pneumonia) 09/10/2011  . Hearing difficulty   . High cholesterol   . History of CVA (cerebrovascular accident) without residual deficits   . History of hip replacement, total, right   . History of pneumonia   . Hx of Clostridium difficile infection   .  Hypercalcemia    09/10/11  . Hyperparathyroidism ([place]) 04/07/2013  . Hypertension    takes Diltiazem daily   . Hypomagnesemia 08/02/2015  . Hypophosphatemia 11/08/2017  . ICB (intracranial bleed) ([place])   . Immunosuppression ([place]) 07/25/2015  . Memory changes   . Morbid obesity ([place])   . Nonischemic dilated cardiomyopathy ([place])   . Obesity 04/07/2013  . Oligouria   . OSA on CPAP   . PAF (paroxysmal atrial fibrillation) (HCC)     HX OF CEREBRAL BLEED  WHILE ON COUMADIN-SO PT NOT ON ANY BLOOD THINNERS NOW  . Peripheral vascular disease ([place])   . Presence of Watchman left atrial appendage closure device 10/04/2017  . Proteinuria 09/04/2016  . Psoriasis 08/07/2016  . 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 S/P insertion of IVC (inferior vena caval) filter   . Sepsis ([place]) 11/08/2017  . Stress fracture    bilateral feet  . Stroke [facility]) 2009   denies residual on 02/04/2018;  hemorrhagic now off coumadin  . Stroke due to intracerebral hemorrhage ([place]) 2009  . Suture reaction 09/04/2016  . Transfusion history   . Use of cane as ambulatory aid     Review of Systems neg for any significant sore throat, dysphagia, itching, sneezing, nasal congestion or excess/ purulent secretions, fever, chills, sweats, unintended wt loss, pleuritic or exertional cp, hempoptysis, orthopnea pnd or change in chronic leg swelling. Also denies presyncope, palpitations, heartburn, abdominal pain, nausea, vomiting, diarrhea or change in bowel or urinary habits, dysuria,hematuria, rash, arthralgias, visual complaints, headache, numbness weakness or ataxia.     Objective:   Physical Exam   Gen. Pleasant, well-nourished, in no distress, in wheelchair ENT - no thrush, no post nasal drip Neck: No JVD, no thyromegaly, no carotid bruits Lungs: no use of accessory muscles, no dullness to percussion, LLL rales no rhonchi  Cardiovascular: Rhythm regular, heart sounds  normal, no murmurs or gallops, 1+ peripheral edema Musculoskeletal: No deformities, no cyanosis or clubbing         Assessment & Plan:

## 2018-06-27 DIAGNOSIS — I132 Hypertensive heart and chronic kidney disease with heart failure and with stage 5 chronic kidney disease, or end stage renal disease: Secondary | ICD-10-CM | POA: Diagnosis not present

## 2018-06-27 DIAGNOSIS — D631 Anemia in chronic kidney disease: Secondary | ICD-10-CM | POA: Diagnosis not present

## 2018-06-27 DIAGNOSIS — N186 End stage renal disease: Secondary | ICD-10-CM | POA: Diagnosis not present

## 2018-06-27 DIAGNOSIS — Z950 Presence of cardiac pacemaker: Secondary | ICD-10-CM | POA: Diagnosis not present

## 2018-06-27 DIAGNOSIS — I5031 Acute diastolic (congestive) heart failure: Secondary | ICD-10-CM | POA: Diagnosis not present

## 2018-06-27 DIAGNOSIS — R531 Weakness: Secondary | ICD-10-CM | POA: Diagnosis not present

## 2018-06-30 DIAGNOSIS — N186 End stage renal disease: Secondary | ICD-10-CM | POA: Diagnosis not present

## 2018-06-30 DIAGNOSIS — R531 Weakness: Secondary | ICD-10-CM | POA: Diagnosis not present

## 2018-06-30 DIAGNOSIS — D631 Anemia in chronic kidney disease: Secondary | ICD-10-CM | POA: Diagnosis not present

## 2018-06-30 DIAGNOSIS — I132 Hypertensive heart and chronic kidney disease with heart failure and with stage 5 chronic kidney disease, or end stage renal disease: Secondary | ICD-10-CM | POA: Diagnosis not present

## 2018-06-30 DIAGNOSIS — Z950 Presence of cardiac pacemaker: Secondary | ICD-10-CM | POA: Diagnosis not present

## 2018-06-30 DIAGNOSIS — I5031 Acute diastolic (congestive) heart failure: Secondary | ICD-10-CM | POA: Diagnosis not present

## 2018-07-01 ENCOUNTER — Ambulatory Visit (INDEPENDENT_AMBULATORY_CARE_PROVIDER_SITE_OTHER): Payer: Medicare Other

## 2018-07-01 ENCOUNTER — Ambulatory Visit (INDEPENDENT_AMBULATORY_CARE_PROVIDER_SITE_OTHER): Payer: Medicare Other | Admitting: Nurse Practitioner

## 2018-07-01 ENCOUNTER — Encounter: Payer: Self-pay | Admitting: Nurse Practitioner

## 2018-07-01 VITALS — BP 134/80 | HR 88 | Temp 97.6°F | Ht 65.0 in | Wt 178.0 lb

## 2018-07-01 DIAGNOSIS — F329 Major depressive disorder, single episode, unspecified: Secondary | ICD-10-CM | POA: Diagnosis not present

## 2018-07-01 DIAGNOSIS — J181 Lobar pneumonia, unspecified organism: Secondary | ICD-10-CM

## 2018-07-01 DIAGNOSIS — I5023 Acute on chronic systolic (congestive) heart failure: Secondary | ICD-10-CM | POA: Diagnosis not present

## 2018-07-01 DIAGNOSIS — J189 Pneumonia, unspecified organism: Secondary | ICD-10-CM

## 2018-07-01 DIAGNOSIS — F32A Depression, unspecified: Secondary | ICD-10-CM

## 2018-07-01 IMAGING — DX DG CHEST 2V
2 series · 2 of 2 positions shown · non-contrast
Comparison: [DATE]

CLINICAL DATA: Follow-up pneumonia.

EXAM:
CHEST - 2 VIEW

[chest pa]
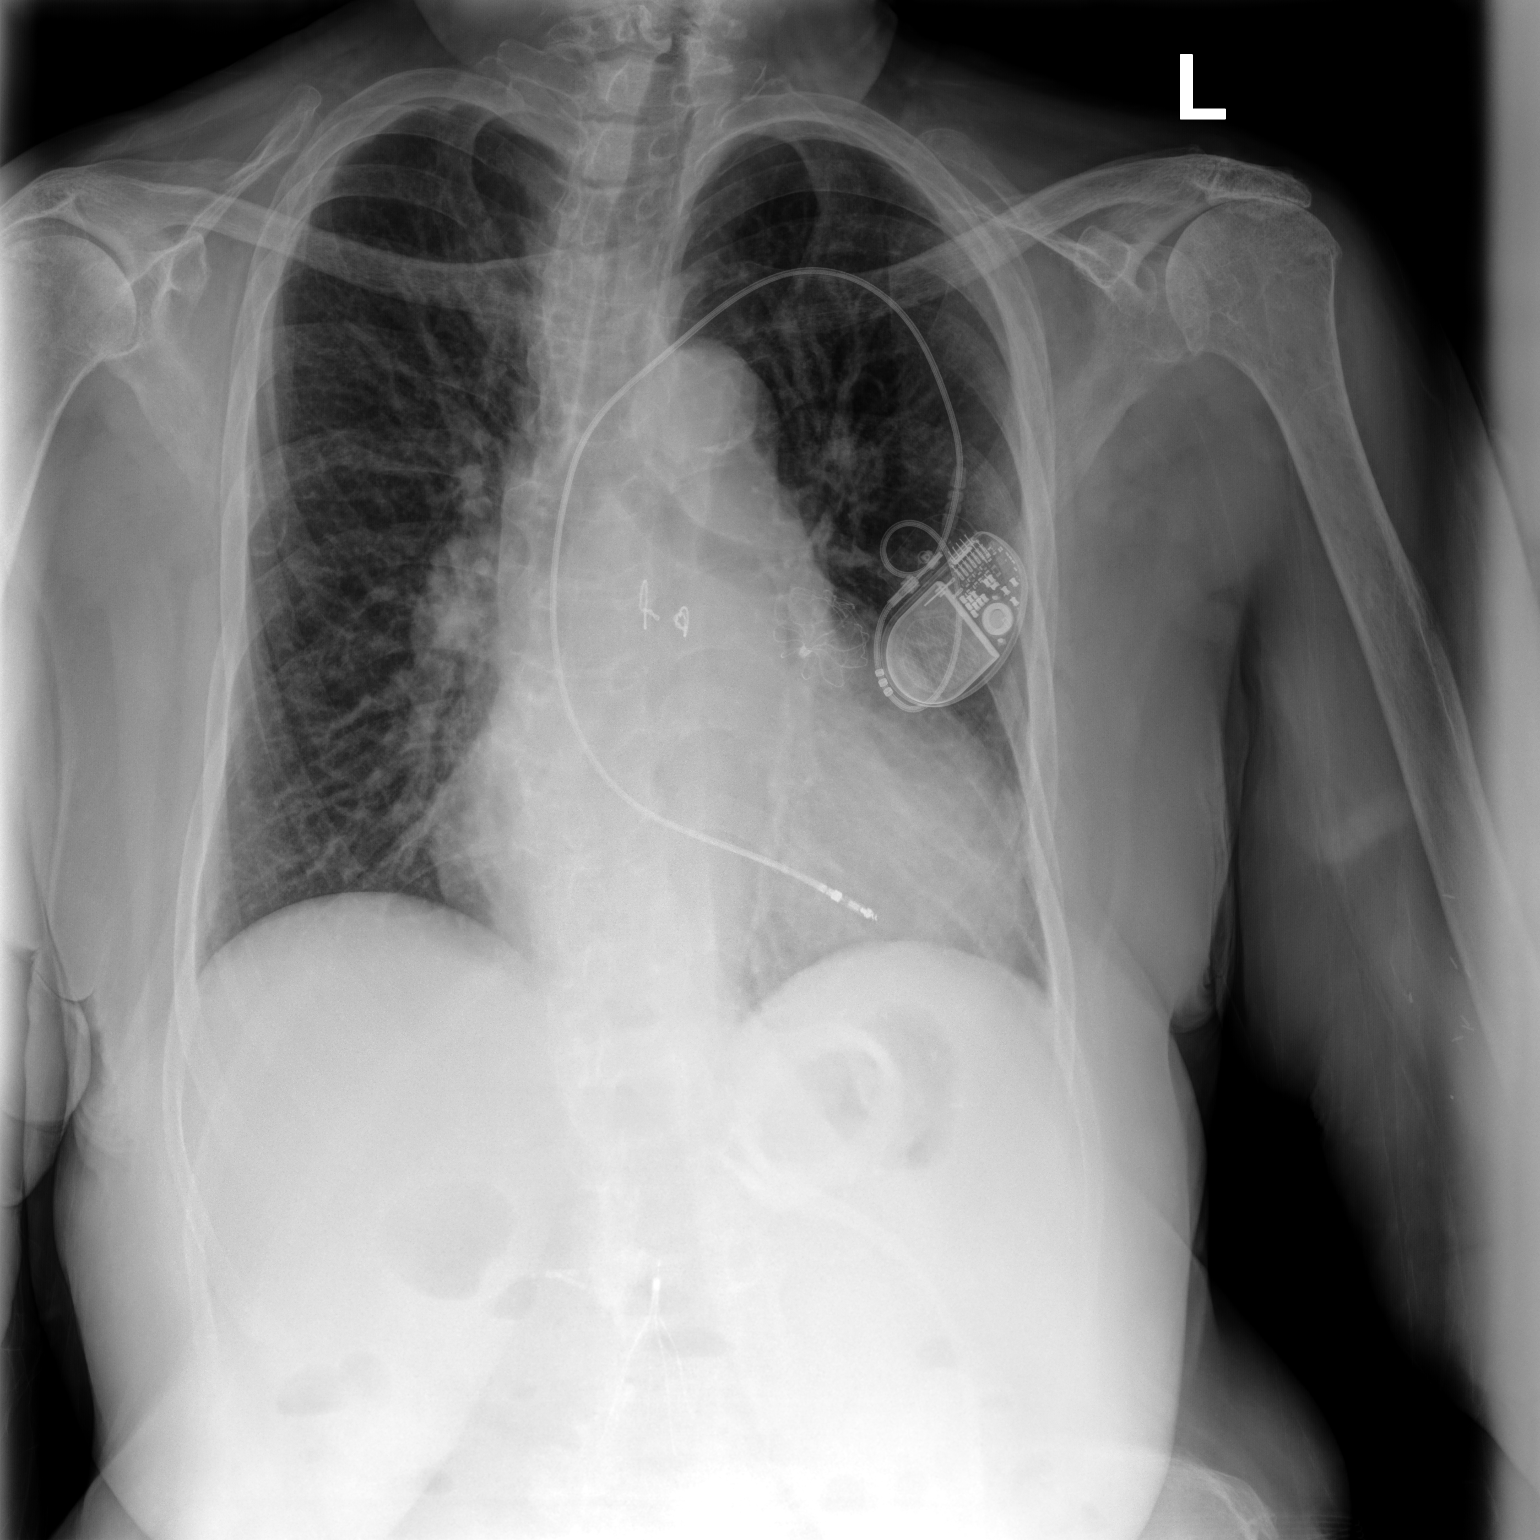

[chest lat]
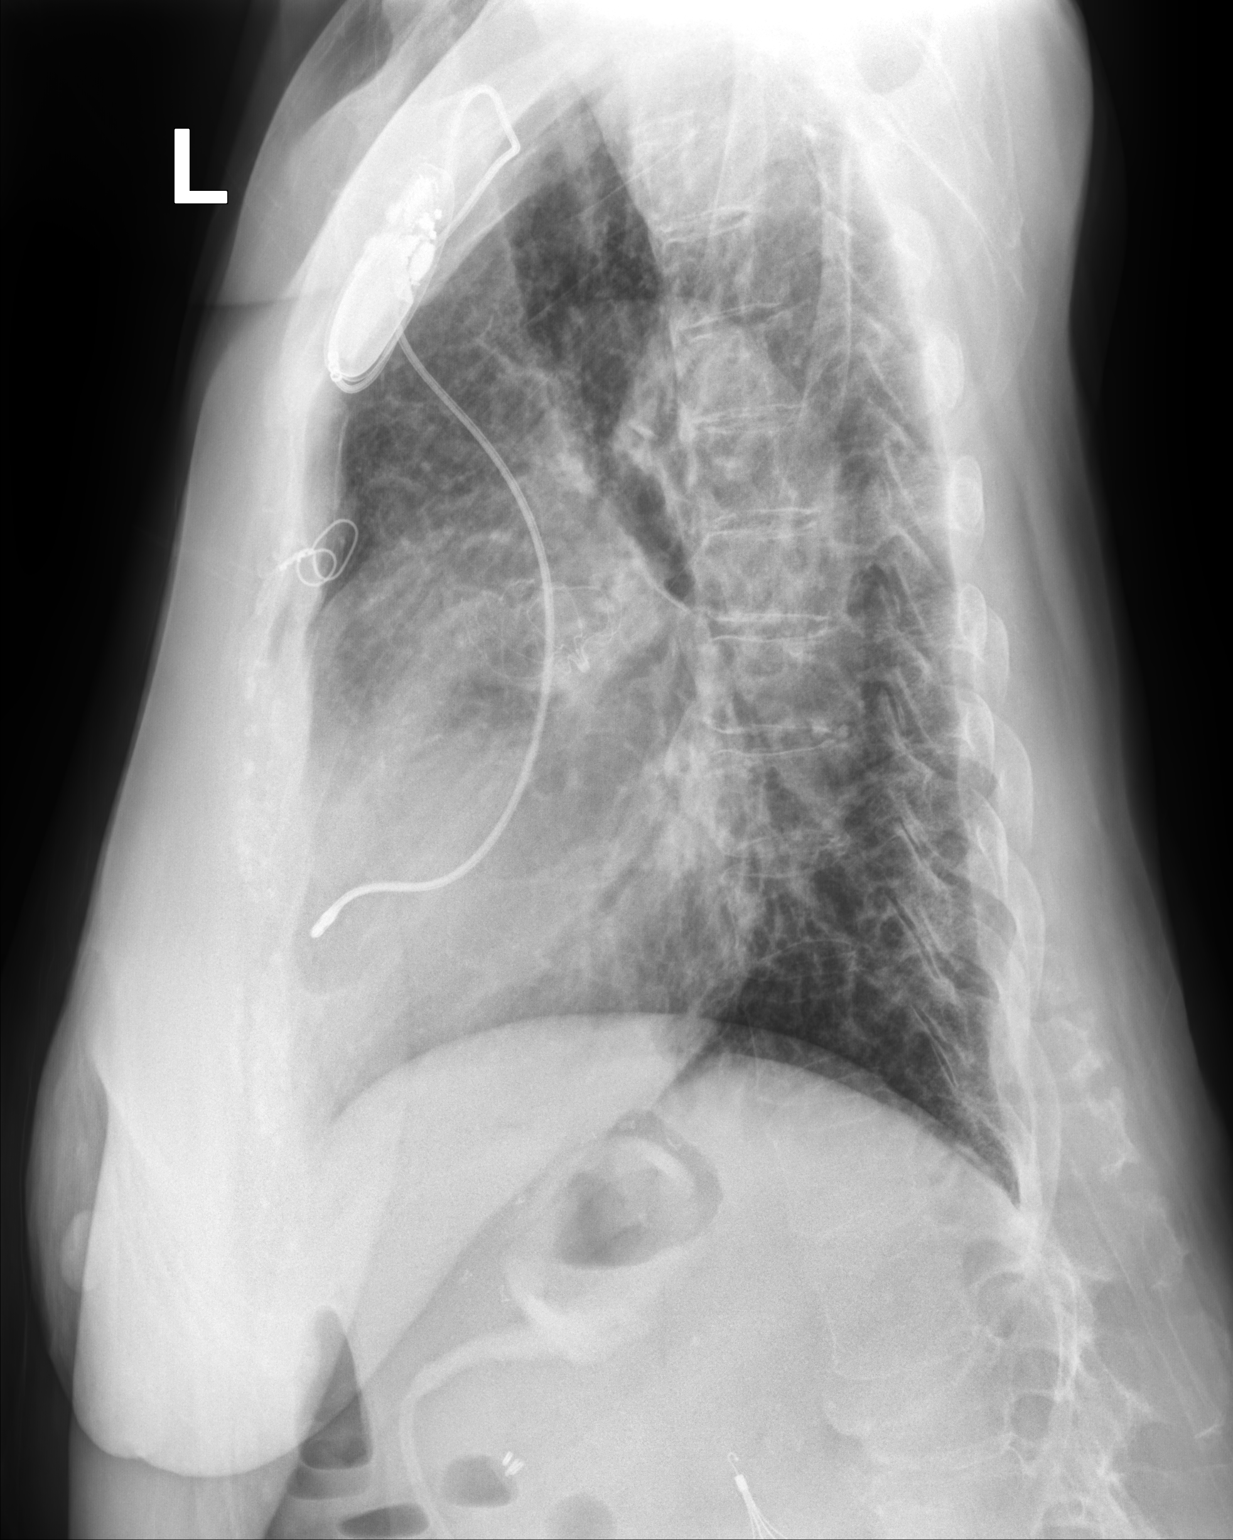

[2 of 2 positions shown; findings below may reference images not displayed]

FINDINGS: Stable cardiomegaly. Stable pacemaker. The heart, hila, mediastinum,
lungs, and pleura are otherwise unremarkable.
IMPRESSION: No active cardiopulmonary disease.

## 2018-07-01 MED ORDER — SERTRALINE HCL 100 MG PO TABS: 100.0000 mg | ORAL_TABLET | Freq: Every day | ORAL | 3 refills | Status: DC

## 2018-07-01 NOTE — Progress Notes (Signed)
Subjective:  Patient ID: WINEFRED HILLESHEIM, female    DOB: 08/12/1945  Age: 72 y.o. MRN: [id number]  CC: Follow-up (1 wk follow up---pneumonia is better,depression is okey. )  HPI Accompanied by husband  CAP vs acute CHF exacerbation: report improved cough and SOB. No fever.  Depression: husband reports lack of motivation and flat affect for several months. Mrs. Edmundson admits to feeling more depressed due to declining health and generalized pain. She is interested in referral to psychology and possible medication change Current use of zoloft for several years. Depression screen [facility] 2/9 07/01/2018 06/23/2018 12/13/2017  Decreased Interest 1 1 2   Down, Depressed, Hopeless 2 1 2   PHQ - 2 Score 3 2 4   Altered sleeping 1 - 0  Tired, decreased energy 2 - 1  Change in appetite 2 - 1  Feeling bad or failure about yourself  3 - 0  Trouble concentrating 1 - 1  Moving slowly or fidgety/restless 1 - 0  Suicidal thoughts 0 - 0  PHQ-9 Score 13 - 7  Difficult doing work/chores - - Not difficult at all  Some recent data might be hidden   GAD 7 : Generalized Anxiety Score 07/01/2018  Nervous, Anxious, on Edge 1  Control/stop worrying 1  Worry too much - different things 1  Trouble relaxing 2  Restless 1  Easily annoyed or irritable 2  Afraid - awful might happen 2  Total GAD 7 Score 10    Reviewed past Medical, Social and Family history today.  Outpatient Medications Prior to Visit  Medication Sig Dispense Refill  . acetaminophen (TYLENOL) 500 MG tablet Take 500 mg by mouth every 6 (six) hours as needed.    Marland Kitchen albuterol (PROVENTIL HFA;VENTOLIN HFA) 108 (90 Base) MCG/ACT inhaler Inhale 2 puffs into the lungs every 6 (six) hours as needed for wheezing or shortness of breath. 18 g 5  . aspirin EC 81 MG tablet Take 81 mg by mouth.    . budesonide-formoterol (SYMBICORT) 80-4.5 MCG/ACT inhaler Inhale 2 puffs into the lungs 2 (two) times daily. 1 Inhaler 6  . cefpodoxime (VANTIN) 200 MG tablet  Take 1 tablet (200 mg total) by mouth 2 (two) times daily for 14 days. 28 tablet 0  . clopidogrel (PLAVIX) 75 MG tablet Take 75 mg by mouth daily.    Marland Kitchen docusate sodium (COLACE) 100 MG capsule Take 100 mg by mouth daily as needed (For constipation.).     Marland Kitchen furosemide (LASIX) 80 MG tablet Take 80 mg by mouth 2 (two) times daily.    . Magnesium 400 MG TABS Take by mouth 2 (two) times daily.    Earley Abide Compounding Base (MOUTH WASH-GP PO) Take by mouth. MUCOSITIS MOUTH WASH-Give 25mls by mouth 4 times daily for 10 days for oral mucositis    . mycophenolate (MYFORTIC) 180 MG EC tablet Take 180 mg by mouth 2 (two) times daily.    . NONFORMULARY OR COMPOUNDED ITEM Compression stockings --thigh high  20-30 mm hg Dx  Low ext edema 1 each 0  . potassium chloride SA (K-DUR,KLOR-CON) 20 MEQ tablet Take 20 mEq by mouth.     . predniSONE (DELTASONE) 5 MG tablet Take 5 mg by mouth daily with breakfast.    . sertraline (ZOLOFT) 50 MG tablet Take 1 tablet (50 mg total) by mouth daily. 90 tablet 3  . tacrolimus (PROGRAF) 1 MG capsule Take 2 mg by mouth daily. 2 mg po q am and 1 mg po q HS    .  azithromycin (ZITHROMAX) 250 MG tablet Take 2 tabs on day then 1 tab daily for days 2-5 (Patient not taking: Reported on 07/01/2018) 6 tablet 0   No facility-administered medications prior to visit.     ROS See HPI  Objective:  BP 134/80   Pulse 88   Temp 97.6 F (36.4 C) (Oral)   Ht 5\' 5"  (1.651 m)   Wt 178 lb (80.7 kg)   SpO2 94%   BMI 29.62 kg/m   BP Readings from Last 3 Encounters:  07/01/18 134/80  06/26/18 138/78  06/23/18 138/82    Wt Readings from Last 3 Encounters:  07/01/18 178 lb (80.7 kg)  06/26/18 176 lb (79.8 kg)  06/23/18 176 lb (79.8 kg)    Physical Exam Vitals signs reviewed.  Cardiovascular:     Rate and Rhythm: Normal rate and regular rhythm.     Heart sounds: Normal heart sounds.  Pulmonary:     Effort: Pulmonary effort is normal. No respiratory distress.     Breath  sounds: No stridor. Rales present. No wheezing or rhonchi.     Comments: Rales in LLL Normal lung sounds in other lobes Chest:     Chest wall: No tenderness.  Neurological:     Mental Status: She is alert and oriented to person, place, and time.  Psychiatric:        Mood and Affect: Mood is not anxious. Affect is blunt.        Speech: Speech normal.        Behavior: Behavior normal. Behavior is cooperative.        Thought Content: Thought content does not include homicidal or suicidal ideation. Thought content does not include homicidal or suicidal plan.        Cognition and Memory: Cognition normal.        Judgment: Judgment normal.     Lab Results  Component Value Date   WBC 9.8 03/19/2018   HGB 11.7 (A) 03/19/2018   HCT 36 03/19/2018   PLT 156 03/19/2018   GLUCOSE 89 02/05/2018   CHOL  11/16/2008    184        ATP III CLASSIFICATION:  <200     mg/dL   Desirable  200-239  mg/dL   Borderline High  >=240    mg/dL   High          TRIG 157 (H) 09/15/2011   HDL 33 (L) 11/16/2008   LDLCALC (H) 11/16/2008    130        Total Cholesterol/HDL:CHD Risk Coronary Heart Disease Risk Table                     Men   Women  1/2 Average Risk   3.4   3.3  Average Risk       5.0   4.4  2 X Average Risk   9.6   7.1  3 X Average Risk  23.4   11.0        Use the calculated Patient Ratio above and the CHD Risk Table to determine the patient's CHD Risk.        ATP III CLASSIFICATION (LDL):  <100     mg/dL   Optimal  100-129  mg/dL   Near or Above                    Optimal  130-159  mg/dL   Borderline  160-189  mg/dL   High  >190  mg/dL   Very High   ALT 57 (H) 02/03/2018   AST 76 (H) 02/03/2018   NA 143 03/19/2018   K 2.9 (A) 03/19/2018   CL 99 02/05/2018   CREATININE 1.7 (A) 03/19/2018   BUN 15 03/19/2018   CO2 24 02/05/2018   TSH 2.655 09/11/2011   INR 1.90 11/03/2017   HGBA1C  11/16/2008    5.5 (NOTE) The ADA recommends the following therapeutic goal for glycemic  control related to Hgb A1c measurement: Goal of therapy: <6.5 Hgb A1c  Reference: American Diabetes Association: Clinical Practice Recommendations 2010, Diabetes Care, 2010, 33: (Suppl  1).    US Renal Transplant W/doppler  Result Date: 02/03/2018 CLINICAL DATA:  Acute renal failure. EXAM: ULTRASOUND OF RENAL TRANSPLANT WITH RENAL DOPPLER ULTRASOUND TECHNIQUE: Ultrasound examination of the renal transplant was performed with gray-scale, color and duplex doppler evaluation. Technologist describes technically difficult study with limited vascular evaluation attributed to body habitus and overlying bowel gas. COMPARISON:  None. FINDINGS: Transplant kidney location: Right lower quadrant Transplant Kidney: Length: 11 cm. Normal in size and parenchymal echogenicity. No evidence of mass or hydronephrosis. No peri-transplant fluid collection seen. Color flow in the main renal artery:  Limited evaluation Color flow in the main renal vein:  Limited evaluation Duplex Doppler Evaluation: Main Renal Artery Resistive Index: Not obtained Venous waveform in main renal vein:  Not demonstrated Intrarenal resistive index in upper pole:  Not obtained (normal 0.6-0.8; equivocal 0.8-0.9; abnormal >= 0.9) Intrarenal resistive index in lower pole: Not obtained (normal 0.6-0.8; equivocal 0.8-0.9; abnormal >= 0.9) Bladder: Normal for degree of bladder distention. Unilateral ureteral jet documented. Other findings: There is a 8.9 x 5.5 x 6.3 cm deep subcutaneous fluid collection superior and lateral to the transplant kidney without significant mass effect upon the kidney or collecting system. IMPRESSION: 1. No hydronephrosis of transplant kidney. Limited vascular evaluation. 2. 8.9 cm deep subcutaneous fluid collection superior and lateral to the transplant kidney without significant mass effect on the kidney. Electronically Signed   By: Lucrezia Europe M.D.   On: 02/03/2018 21:52   Dg Chest Portable 1 View  Result Date:  02/03/2018 CLINICAL DATA:  Chest pain. Shortness of breath. Generalized weakness. EXAM: PORTABLE CHEST 1 VIEW COMPARISON:  Two-view chest x-ray 11/03/2017. FINDINGS: Heart is enlarged. Defibrillator pad is in place. Lung volumes are low. Mild pulmonary vascular congestion is present. No focal airspace disease is evident. IMPRESSION: 1. Cardiomegaly and mild pulmonary vascular congestion without frank edema. Early congestive heart failure is not excluded. 2. No focal airspace disease. Electronically Signed   By: San Morelle M.D.   On: 02/03/2018 16:16    Assessment & Plan:   Laina was seen today for follow-up.  Diagnoses and all orders for this visit:  Acute on chronic systolic heart failure ([place]) -     Cancel: Basic metabolic panel -     Cancel: B Nat Peptide -     DG Chest 2 View -     B Nat Peptide; Future -     Basic metabolic panel; Future  Community acquired pneumonia of left lower lobe of lung ([place]) -     Cancel: Basic metabolic panel -     Cancel: B Nat Peptide -     DG Chest 2 View   I am having Maili T. Breault maintain her docusate sodium, aspirin EC, mycophenolate, albuterol, predniSONE, acetaminophen, furosemide, NONFORMULARY OR COMPOUNDED ITEM, potassium chloride SA, Magnesium, Mouthwash Compounding Base (MOUTH WASH-GP PO), clopidogrel, tacrolimus, sertraline,  cefpodoxime, azithromycin, and budesonide-formoterol.  No orders of the defined types were placed in this encounter.  Normal CXR. Pending BNP and BMP.  Increased zoloft to 100mg  Entered referral to psychology  Problem List Items Addressed This Visit      Cardiovascular and Mediastinum   Acute on chronic systolic heart failure ([employer]) - Primary   Relevant Orders   DG Chest 2 View (Completed)   B Nat Peptide   Basic metabolic panel     Respiratory   Community acquired pneumonia of left lower lobe of lung ([place])   Relevant Orders   DG Chest 2 View (Completed)      Follow-up: Return if symptoms worsen  or fail to improve.  Wilfred Lacy, NP

## 2018-07-01 NOTE — Patient Instructions (Addendum)
Normal CXR. Pending BNP and BMP.  Increased zoloft to 100mg  Entered referral to psychology

## 2018-07-02 DIAGNOSIS — N186 End stage renal disease: Secondary | ICD-10-CM | POA: Diagnosis not present

## 2018-07-02 DIAGNOSIS — D631 Anemia in chronic kidney disease: Secondary | ICD-10-CM | POA: Diagnosis not present

## 2018-07-02 DIAGNOSIS — R531 Weakness: Secondary | ICD-10-CM | POA: Diagnosis not present

## 2018-07-02 DIAGNOSIS — I5031 Acute diastolic (congestive) heart failure: Secondary | ICD-10-CM | POA: Diagnosis not present

## 2018-07-02 DIAGNOSIS — I132 Hypertensive heart and chronic kidney disease with heart failure and with stage 5 chronic kidney disease, or end stage renal disease: Secondary | ICD-10-CM | POA: Diagnosis not present

## 2018-07-02 DIAGNOSIS — Z950 Presence of cardiac pacemaker: Secondary | ICD-10-CM | POA: Diagnosis not present

## 2018-07-04 DIAGNOSIS — Z940 Kidney transplant status: Secondary | ICD-10-CM | POA: Diagnosis not present

## 2018-07-07 DIAGNOSIS — D631 Anemia in chronic kidney disease: Secondary | ICD-10-CM | POA: Diagnosis not present

## 2018-07-07 DIAGNOSIS — R531 Weakness: Secondary | ICD-10-CM | POA: Diagnosis not present

## 2018-07-07 DIAGNOSIS — I5031 Acute diastolic (congestive) heart failure: Secondary | ICD-10-CM | POA: Diagnosis not present

## 2018-07-07 DIAGNOSIS — N186 End stage renal disease: Secondary | ICD-10-CM | POA: Diagnosis not present

## 2018-07-07 DIAGNOSIS — I132 Hypertensive heart and chronic kidney disease with heart failure and with stage 5 chronic kidney disease, or end stage renal disease: Secondary | ICD-10-CM | POA: Diagnosis not present

## 2018-07-07 DIAGNOSIS — Z950 Presence of cardiac pacemaker: Secondary | ICD-10-CM | POA: Diagnosis not present

## 2018-07-17 DIAGNOSIS — Z940 Kidney transplant status: Secondary | ICD-10-CM | POA: Diagnosis not present

## 2018-07-17 DIAGNOSIS — Z79899 Other long term (current) drug therapy: Secondary | ICD-10-CM | POA: Diagnosis not present

## 2018-07-17 DIAGNOSIS — D8989 Other specified disorders involving the immune mechanism, not elsewhere classified: Secondary | ICD-10-CM | POA: Diagnosis not present

## 2018-07-17 DIAGNOSIS — E869 Volume depletion, unspecified: Secondary | ICD-10-CM | POA: Diagnosis not present

## 2018-07-17 DIAGNOSIS — E878 Other disorders of electrolyte and fluid balance, not elsewhere classified: Secondary | ICD-10-CM | POA: Diagnosis not present

## 2018-07-17 DIAGNOSIS — R630 Anorexia: Secondary | ICD-10-CM | POA: Diagnosis not present

## 2018-07-17 DIAGNOSIS — R600 Localized edema: Secondary | ICD-10-CM | POA: Diagnosis not present

## 2018-07-17 DIAGNOSIS — Z7952 Long term (current) use of systemic steroids: Secondary | ICD-10-CM | POA: Diagnosis not present

## 2018-07-17 DIAGNOSIS — E785 Hyperlipidemia, unspecified: Secondary | ICD-10-CM | POA: Diagnosis not present

## 2018-07-17 DIAGNOSIS — E119 Type 2 diabetes mellitus without complications: Secondary | ICD-10-CM | POA: Diagnosis not present

## 2018-07-17 DIAGNOSIS — I10 Essential (primary) hypertension: Secondary | ICD-10-CM | POA: Diagnosis not present

## 2018-07-17 DIAGNOSIS — I4819 Other persistent atrial fibrillation: Secondary | ICD-10-CM | POA: Diagnosis not present

## 2018-07-17 DIAGNOSIS — R609 Edema, unspecified: Secondary | ICD-10-CM | POA: Diagnosis not present

## 2018-07-17 DIAGNOSIS — Z4822 Encounter for aftercare following kidney transplant: Secondary | ICD-10-CM | POA: Diagnosis not present

## 2018-07-21 ENCOUNTER — Encounter: Payer: Self-pay | Admitting: Nurse Practitioner

## 2018-07-21 ENCOUNTER — Ambulatory Visit (INDEPENDENT_AMBULATORY_CARE_PROVIDER_SITE_OTHER): Payer: Medicare Other | Admitting: Nurse Practitioner

## 2018-07-21 ENCOUNTER — Ambulatory Visit (INDEPENDENT_AMBULATORY_CARE_PROVIDER_SITE_OTHER): Payer: Medicare Other

## 2018-07-21 ENCOUNTER — Ambulatory Visit: Payer: Self-pay | Admitting: Nurse Practitioner

## 2018-07-21 VITALS — BP 150/90 | HR 82 | Temp 98.2°F | Ht 65.0 in | Wt 179.6 lb

## 2018-07-21 DIAGNOSIS — R0602 Shortness of breath: Secondary | ICD-10-CM

## 2018-07-21 IMAGING — DX DG CHEST 2V
2 series · 2 of 2 positions shown · non-contrast
Comparison: [DATE]

CLINICAL DATA: Shortness of breath

EXAM:
CHEST - 2 VIEW

[chest pa]
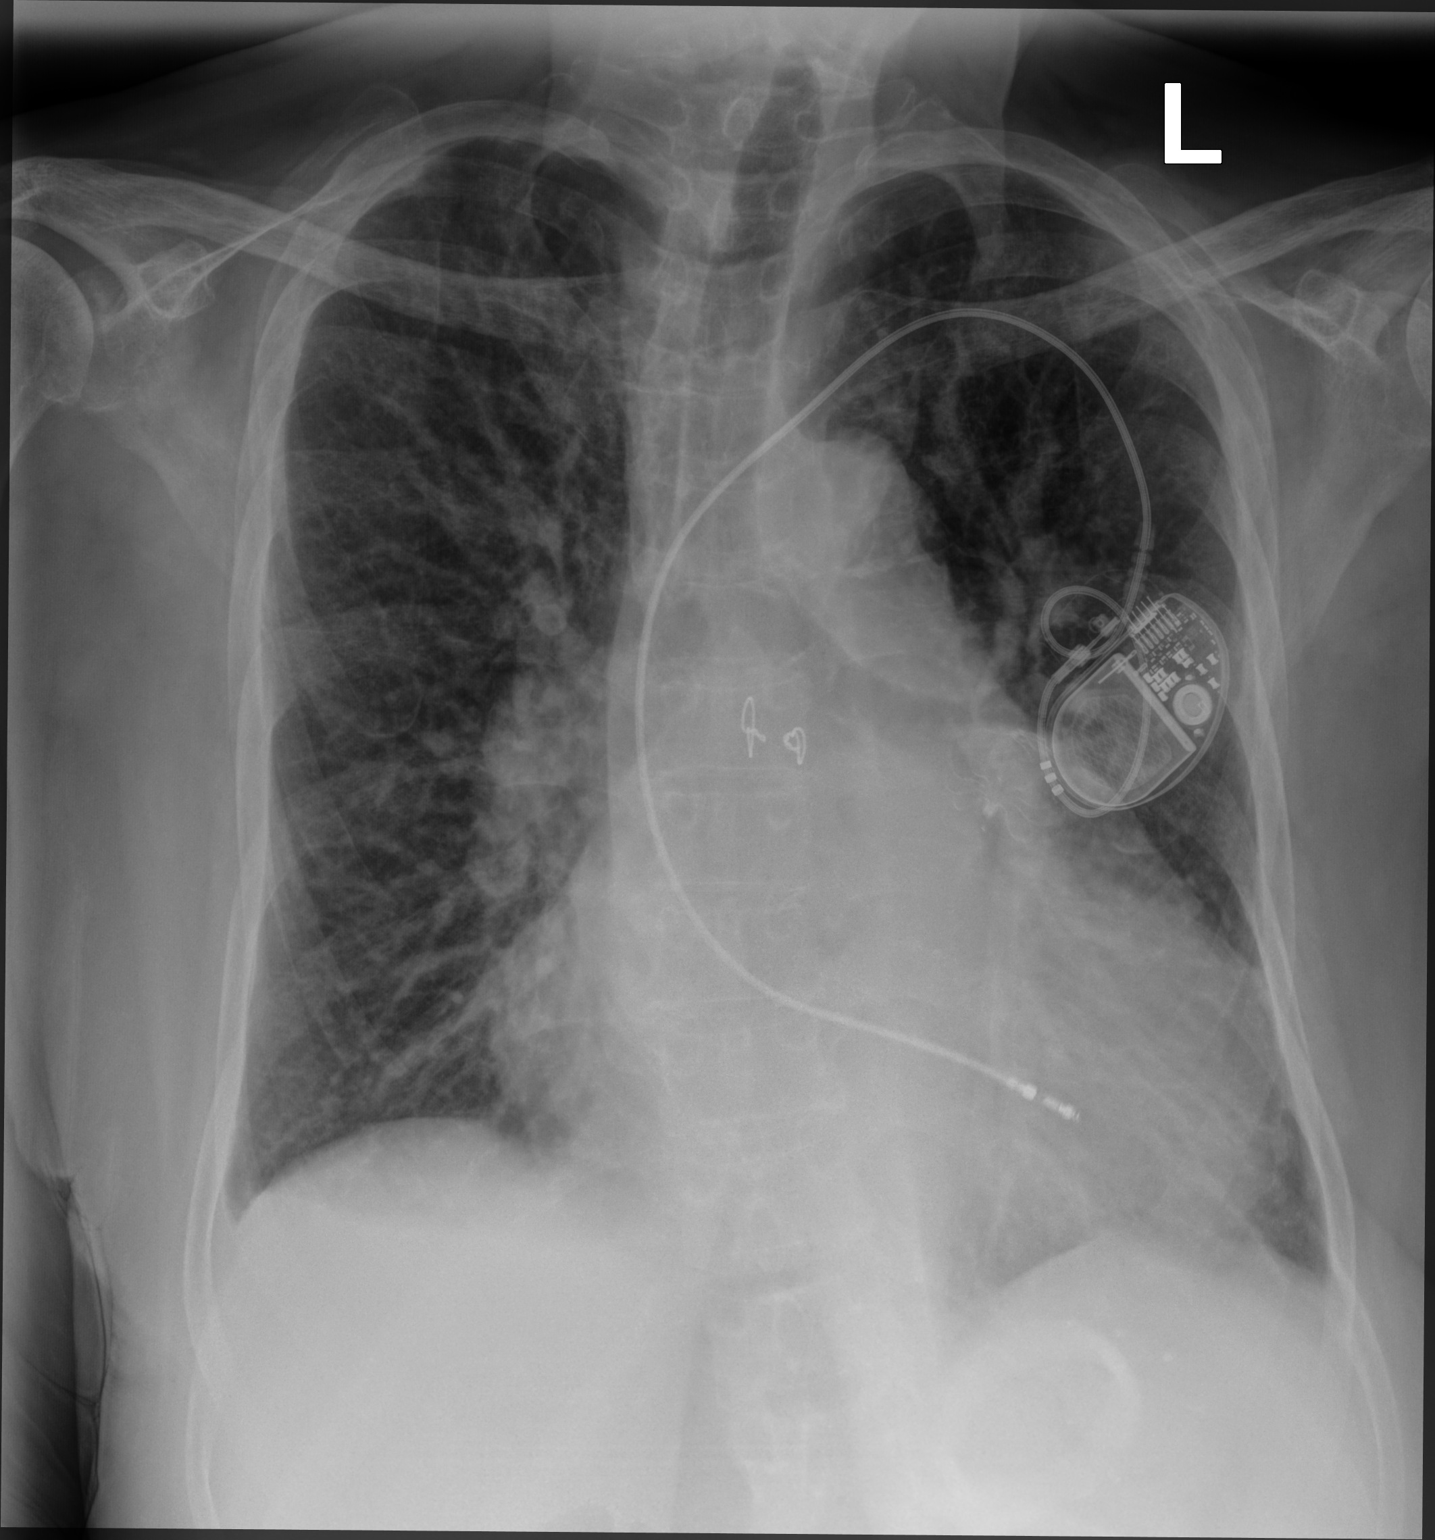

[chest lat]
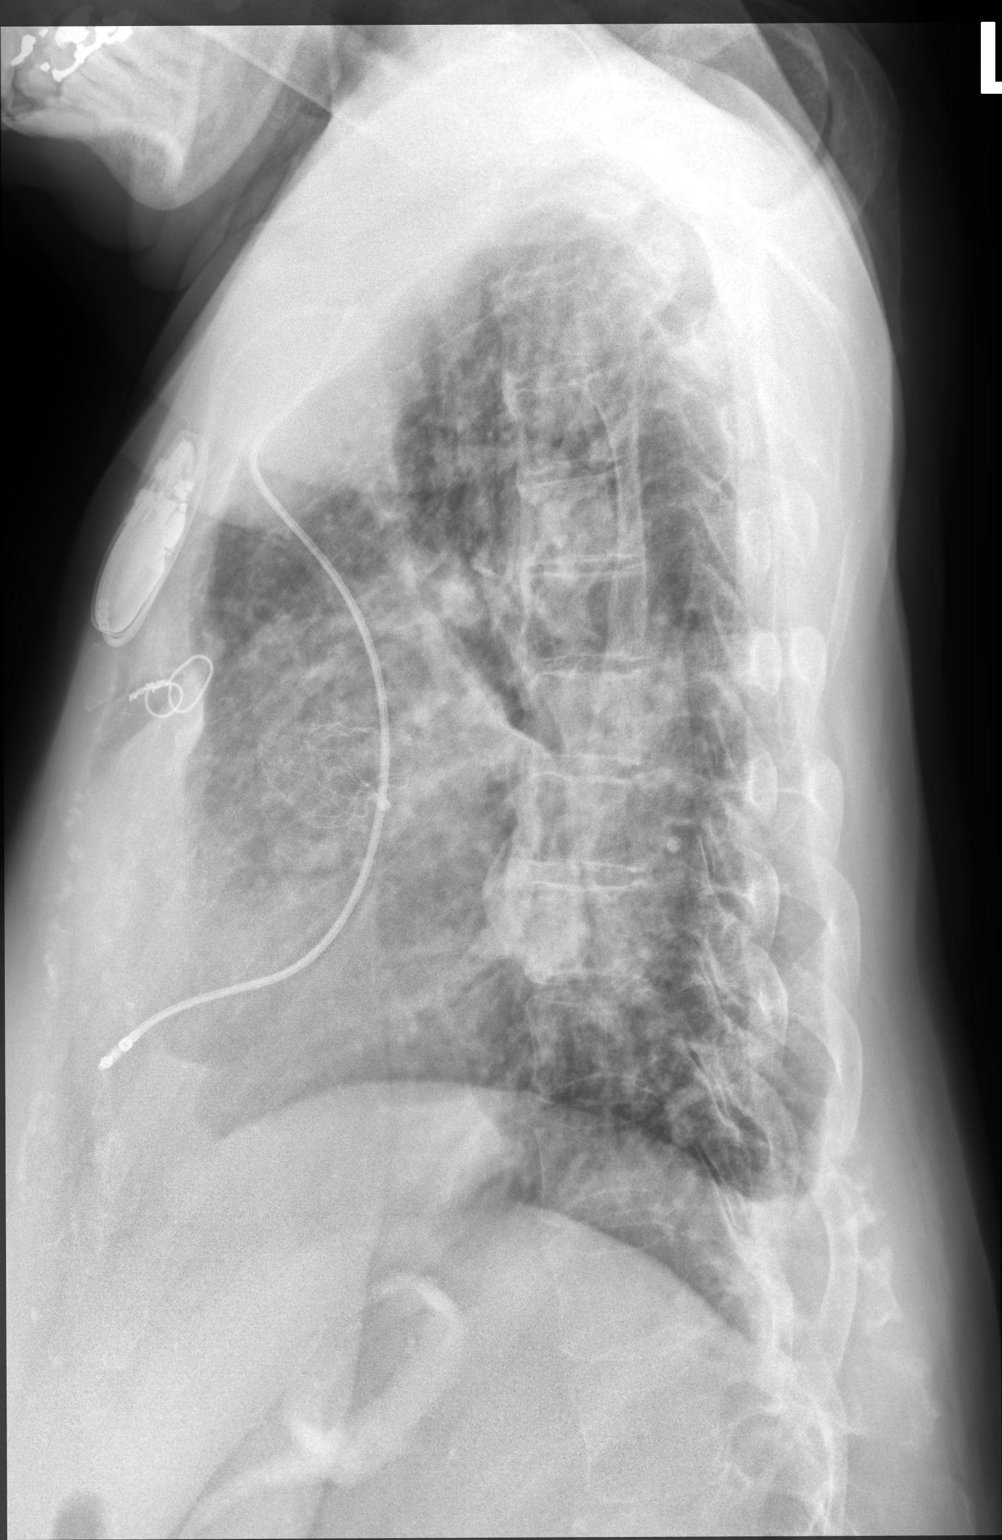

[2 of 2 positions shown; findings below may reference images not displayed]

FINDINGS: There is marked cardiomegaly. Left chest wall pacemaker single lead
is unchanged. No focal airspace consolidation or pulmonary edema. No
pleural effusion pneumothorax.
IMPRESSION: No active cardiopulmonary disease.

## 2018-07-21 NOTE — Patient Instructions (Addendum)
Clarify furosemide dose with Dr. Jimmy Footman.  Normal CXR  Have lab results from [facility] faxed to me.

## 2018-07-21 NOTE — Telephone Encounter (Signed)
Patient C/O difficulty breathing when she gets up and moves around.  Patient states she is also getting tired easily when she is moving around. / Patient states when she gets short of breath she will use her inhalers  / No runny nose, occasional coughing / Patient states she used her albuterol inhaler on Saturday Q4hrs  But has not done that since as it is scheduled Q6hrs.  Appointment made with [name] for today Reason for Disposition . [1] MILD asthma attack (e.g., no SOB at rest, mild SOB with walking, speaks normally in sentences, mild wheezing) AND [2]  persists > 24 hours on appropriate treatment  Answer Assessment - Initial Assessment Questions 1. RESPIRATORY STATUS: "Describe your breathing?" (e.g., wheezing, shortness of breath, unable to speak, severe coughing)      No wheezing, just short of breath 2. ONSET: "When did this asthma attack begin?"      Since Saturday 07-19-2018   5. SEVERITY: "How bad is this attack?" mild   - MILD: No SOB at rest, mild SOB with walking, speaks normally in sentences, can lay down, no retractions, pulse < 100. (GREEN Zone: PEFR 80-100%)   6. MEDICATIONS (Inhaler or nebs): "What are your asthma medications?" and "What treatments have you given so far?" patient has been taking her inhalers  - 7. OTHER SYMPTOMS: "Do you have any other symptoms? (e.g., runny nose, chest pain, fever)  no  Protocols used: ASTHMA ATTACK-A-AH

## 2018-07-21 NOTE — Progress Notes (Signed)
Subjective:  Patient ID: [name], female    DOB: 04/07/1946  Age: 73 y.o. MRN: [id number]  CC: Shortness of Breath (SOB when doing things, can only walk 1/2 way through house/ reoccurence/ med refill on lasix?)   Shortness of Breath  This is a chronic problem. The current episode started in the past 7 days. The problem occurs constantly. The problem has been unchanged. Pertinent negatives include no chest pain, leg pain, leg swelling, PND, sputum production or wheezing. The symptoms are aggravated by any activity. The patient has no known risk factors for DVT/PE. She has tried nothing for the symptoms.   Reviewed past Medical, Social and Family history today.  Outpatient Medications Prior to Visit  Medication Sig Dispense Refill  . acetaminophen (TYLENOL) 500 MG tablet Take 500 mg by mouth every 6 (six) hours as needed.    Marland Kitchen albuterol (PROVENTIL HFA;VENTOLIN HFA) 108 (90 Base) MCG/ACT inhaler Inhale 2 puffs into the lungs every 6 (six) hours as needed for wheezing or shortness of breath. 18 g 5  . aspirin EC 81 MG tablet Take 81 mg by mouth.    . budesonide-formoterol (SYMBICORT) 80-4.5 MCG/ACT inhaler Inhale 2 puffs into the lungs 2 (two) times daily. 1 Inhaler 6  . clopidogrel (PLAVIX) 75 MG tablet Take 75 mg by mouth daily.    Marland Kitchen docusate sodium (COLACE) 100 MG capsule Take 100 mg by mouth daily as needed (For constipation.).     Marland Kitchen furosemide (LASIX) 80 MG tablet Take 80 mg by mouth 2 (two) times daily.    . Magnesium 400 MG TABS Take by mouth 2 (two) times daily.    Earley Abide Compounding Base (MOUTH WASH-GP PO) Take by mouth. MUCOSITIS MOUTH WASH-Give 23mls by mouth 4 times daily for 10 days for oral mucositis    . mycophenolate (MYFORTIC) 180 MG EC tablet Take 180 mg by mouth 2 (two) times daily.    . NONFORMULARY OR COMPOUNDED ITEM Compression stockings --thigh high  20-30 mm hg Dx  Low ext edema 1 each 0  . potassium chloride SA (K-DUR,KLOR-CON) 20 MEQ tablet Take 20 mEq by  mouth.     . predniSONE (DELTASONE) 5 MG tablet Take 5 mg by mouth daily with breakfast.    . sertraline (ZOLOFT) 100 MG tablet Take 1 tablet (100 mg total) by mouth daily. 30 tablet 3  . tacrolimus (PROGRAF) 1 MG capsule Take 2 mg by mouth daily. 2 mg po q am and 1 mg po q HS     No facility-administered medications prior to visit.     ROS See HPI  Objective:  BP (!) 150/90   Pulse 82   Temp 98.2 F (36.8 C) (Oral)   Ht 5\' 5"  (1.651 m)   Wt 179 lb 9.6 oz (81.5 kg)   SpO2 92%   BMI 29.89 kg/m   BP Readings from Last 3 Encounters:  07/21/18 (!) 150/90  07/01/18 134/80  06/26/18 138/78    Wt Readings from Last 3 Encounters:  07/21/18 179 lb 9.6 oz (81.5 kg)  07/01/18 178 lb (80.7 kg)  06/26/18 176 lb (79.8 kg)    Physical Exam Vitals signs reviewed.  Pulmonary:     Effort: Pulmonary effort is normal.     Breath sounds: Normal breath sounds.  Chest:     Chest wall: No tenderness.  Musculoskeletal:     Left lower leg: No edema.  Neurological:     Mental Status: She is alert.  Psychiatric:  Mood and Affect: Mood normal.     Lab Results  Component Value Date   WBC 9.8 03/19/2018   HGB 11.7 (A) 03/19/2018   HCT 36 03/19/2018   PLT 156 03/19/2018   GLUCOSE 89 02/05/2018   CHOL  11/16/2008    184        ATP III CLASSIFICATION:  <200     mg/dL   Desirable  200-239  mg/dL   Borderline High  >=240    mg/dL   High          TRIG 157 (H) 09/15/2011   HDL 33 (L) 11/16/2008   LDLCALC (H) 11/16/2008    130        Total Cholesterol/HDL:CHD Risk Coronary Heart Disease Risk Table                     Men   Women  1/2 Average Risk   3.4   3.3  Average Risk       5.0   4.4  2 X Average Risk   9.6   7.1  3 X Average Risk  23.4   11.0        Use the calculated Patient Ratio above and the CHD Risk Table to determine the patient's CHD Risk.        ATP III CLASSIFICATION (LDL):  <100     mg/dL   Optimal  100-129  mg/dL   Near or Above                     Optimal  130-159  mg/dL   Borderline  160-189  mg/dL   High  >190     mg/dL   Very High   ALT 57 (H) 02/03/2018   AST 76 (H) 02/03/2018   NA 143 03/19/2018   K 2.9 (A) 03/19/2018   CL 99 02/05/2018   CREATININE 1.7 (A) 03/19/2018   BUN 15 03/19/2018   CO2 24 02/05/2018   TSH 2.655 09/11/2011   INR 1.90 11/03/2017   HGBA1C  11/16/2008    5.5 (NOTE) The ADA recommends the following therapeutic goal for glycemic control related to Hgb A1c measurement: Goal of therapy: <6.5 Hgb A1c  Reference: American Diabetes Association: Clinical Practice Recommendations 2010, Diabetes Care, 2010, 33: (Suppl  1).    US Renal Transplant W/doppler  Result Date: 02/03/2018 CLINICAL DATA:  Acute renal failure. EXAM: ULTRASOUND OF RENAL TRANSPLANT WITH RENAL DOPPLER ULTRASOUND TECHNIQUE: Ultrasound examination of the renal transplant was performed with gray-scale, color and duplex doppler evaluation. Technologist describes technically difficult study with limited vascular evaluation attributed to body habitus and overlying bowel gas. COMPARISON:  None. FINDINGS: Transplant kidney location: Right lower quadrant Transplant Kidney: Length: 11 cm. Normal in size and parenchymal echogenicity. No evidence of mass or hydronephrosis. No peri-transplant fluid collection seen. Color flow in the main renal artery:  Limited evaluation Color flow in the main renal vein:  Limited evaluation Duplex Doppler Evaluation: Main Renal Artery Resistive Index: Not obtained Venous waveform in main renal vein:  Not demonstrated Intrarenal resistive index in upper pole:  Not obtained (normal 0.6-0.8; equivocal 0.8-0.9; abnormal >= 0.9) Intrarenal resistive index in lower pole: Not obtained (normal 0.6-0.8; equivocal 0.8-0.9; abnormal >= 0.9) Bladder: Normal for degree of bladder distention. Unilateral ureteral jet documented. Other findings: There is a 8.9 x 5.5 x 6.3 cm deep subcutaneous fluid collection superior and lateral to the  transplant kidney without significant mass effect upon the  kidney or collecting system. IMPRESSION: 1. No hydronephrosis of transplant kidney. Limited vascular evaluation. 2. 8.9 cm deep subcutaneous fluid collection superior and lateral to the transplant kidney without significant mass effect on the kidney. Electronically Signed   By: Lucrezia Europe M.D.   On: 02/03/2018 21:52   Dg Chest Portable 1 View  Result Date: 02/03/2018 CLINICAL DATA:  Chest pain. Shortness of breath. Generalized weakness. EXAM: PORTABLE CHEST 1 VIEW COMPARISON:  Two-view chest x-ray 11/03/2017. FINDINGS: Heart is enlarged. Defibrillator pad is in place. Lung volumes are low. Mild pulmonary vascular congestion is present. No focal airspace disease is evident. IMPRESSION: 1. Cardiomegaly and mild pulmonary vascular congestion without frank edema. Early congestive heart failure is not excluded. 2. No focal airspace disease. Electronically Signed   By: San Morelle M.D.   On: 02/03/2018 16:16    Assessment & Plan:   [name] was seen today for shortness of breath.  Diagnoses and all orders for this visit:  SOB (shortness of breath) on exertion -     DG Chest 2 View   I am having [name] maintain her docusate sodium, aspirin EC, mycophenolate, albuterol, predniSONE, acetaminophen, furosemide, NONFORMULARY OR COMPOUNDED ITEM, potassium chloride SA, Magnesium, Mouthwash Compounding Base (MOUTH WASH-GP PO), clopidogrel, tacrolimus, budesonide-formoterol, and sertraline.  No orders of the defined types were placed in this encounter.   Problem List Items Addressed This Visit    None    Visit Diagnoses    SOB (shortness of breath) on exertion    -  Primary   Relevant Orders   DG Chest 2 View (Completed)       Follow-up: Return if symptoms worsen or fail to improve.  Wilfred Lacy, NP

## 2018-07-24 ENCOUNTER — Telehealth: Payer: Self-pay

## 2018-07-24 ENCOUNTER — Encounter: Payer: Self-pay | Admitting: Nurse Practitioner

## 2018-07-29 ENCOUNTER — Ambulatory Visit (INDEPENDENT_AMBULATORY_CARE_PROVIDER_SITE_OTHER): Payer: Medicare Other | Admitting: Nurse Practitioner

## 2018-07-29 ENCOUNTER — Encounter: Payer: Self-pay | Admitting: Nurse Practitioner

## 2018-07-29 VITALS — BP 122/76 | HR 88 | Temp 98.6°F | Ht 65.0 in | Wt 164.8 lb

## 2018-07-29 DIAGNOSIS — F329 Major depressive disorder, single episode, unspecified: Secondary | ICD-10-CM | POA: Diagnosis not present

## 2018-07-29 DIAGNOSIS — F32A Depression, unspecified: Secondary | ICD-10-CM

## 2018-07-29 DIAGNOSIS — R630 Anorexia: Secondary | ICD-10-CM

## 2018-07-29 NOTE — Patient Instructions (Addendum)
Continue zoloft at current dose.  Increase boost to 1bottle twice a day. Ok, to mix boost with ice cream.  Encourage 3-4 small frequent meals: meal should consist of vegetables, protein and carbs.  Encourage physical activity around home: walking around home.  maintain appt with psychology

## 2018-07-29 NOTE — Progress Notes (Signed)
Subjective:  Patient ID: [name], female    DOB: 1945/08/11  Age: 73 y.o. MRN: [id number]  CC: Follow-up (4 wk f/u-- still not eating a lot)  HPI  Accompanied by husband.  Depression: Decrease appetite:eats only breakfast. Skips lunch and dinner. Drinks 1 bottle of boost per day  No dysphagia. No ABD pain. No change in BM. No change in quality of sleep (sleeps during the day and up most nights)  no adverse effects wit increase dose of zoloft. No interested in use of remeron at this time. Depression screen [facility] 2/9 07/01/2018 06/23/2018 12/13/2017  Decreased Interest 1 1 2   Down, Depressed, Hopeless 2 1 2   PHQ - 2 Score 3 2 4   Altered sleeping 1 - 0  Tired, decreased energy 2 - 1  Change in appetite 2 - 1  Feeling bad or failure about yourself  3 - 0  Trouble concentrating 1 - 1  Moving slowly or fidgety/restless 1 - 0  Suicidal thoughts 0 - 0  PHQ-9 Score 13 - 7  Difficult doing work/chores - - Not difficult at all  Some recent data might be hidden   Reviewed past Medical, Social and Family history today.  Outpatient Medications Prior to Visit  Medication Sig Dispense Refill  . acetaminophen (TYLENOL) 500 MG tablet Take 500 mg by mouth every 6 (six) hours as needed.    Marland Kitchen albuterol (PROVENTIL HFA;VENTOLIN HFA) 108 (90 Base) MCG/ACT inhaler Inhale 2 puffs into the lungs every 6 (six) hours as needed for wheezing or shortness of breath. 18 g 5  . aspirin EC 81 MG tablet Take 81 mg by mouth.    . budesonide-formoterol (SYMBICORT) 80-4.5 MCG/ACT inhaler Inhale 2 puffs into the lungs 2 (two) times daily. 1 Inhaler 6  . clopidogrel (PLAVIX) 75 MG tablet Take 75 mg by mouth daily.    Marland Kitchen docusate sodium (COLACE) 100 MG capsule Take 100 mg by mouth daily as needed (For constipation.).     Marland Kitchen furosemide (LASIX) 80 MG tablet Take 80 mg by mouth 2 (two) times daily.    . Magnesium 400 MG TABS Take by mouth 2 (two) times daily.    Earley Abide Compounding Base (MOUTH WASH-GP PO) Take by  mouth. MUCOSITIS MOUTH WASH-Give 19mls by mouth 4 times daily for 10 days for oral mucositis    . mycophenolate (MYFORTIC) 180 MG EC tablet Take 180 mg by mouth 2 (two) times daily.    . NONFORMULARY OR COMPOUNDED ITEM Compression stockings --thigh high  20-30 mm hg Dx  Low ext edema 1 each 0  . potassium chloride SA (K-DUR,KLOR-CON) 20 MEQ tablet Take 20 mEq by mouth.     . predniSONE (DELTASONE) 5 MG tablet Take 5 mg by mouth daily with breakfast.    . sertraline (ZOLOFT) 100 MG tablet Take 1 tablet (100 mg total) by mouth daily. 30 tablet 3  . tacrolimus (PROGRAF) 1 MG capsule Take 2 mg by mouth daily. 2 mg po q am and 1 mg po q HS     No facility-administered medications prior to visit.     ROS See HPI  Objective:  BP 122/76   Pulse 88   Temp 98.6 F (37 C) (Oral)   Ht 5\' 5"  (1.651 m)   Wt 164 lb 12.8 oz (74.8 kg)   SpO2 95%   BMI 27.42 kg/m   BP Readings from Last 3 Encounters:  07/29/18 122/76  07/21/18 (!) 150/90  07/01/18 134/80  Wt Readings from Last 3 Encounters:  07/29/18 164 lb 12.8 oz (74.8 kg)  07/21/18 179 lb 9.6 oz (81.5 kg)  07/01/18 178 lb (80.7 kg)    Physical Exam Cardiovascular:     Rate and Rhythm: Regular rhythm.  Pulmonary:     Effort: Pulmonary effort is normal.     Breath sounds: Normal breath sounds.  Abdominal:     General: Bowel sounds are normal. There is no distension.     Palpations: Abdomen is soft.     Tenderness: There is no abdominal tenderness.  Musculoskeletal:     Right lower leg: Edema present.     Left lower leg: Edema present.  Neurological:     Mental Status: She is alert and oriented to person, place, and time.     Lab Results  Component Value Date   WBC 9.8 03/19/2018   HGB 11.7 (A) 03/19/2018   HCT 36 03/19/2018   PLT 156 03/19/2018   GLUCOSE 89 02/05/2018   CHOL  11/16/2008    184        ATP III CLASSIFICATION:  <200     mg/dL   Desirable  200-239  mg/dL   Borderline High  >=240    mg/dL   High           TRIG 157 (H) 09/15/2011   HDL 33 (L) 11/16/2008   LDLCALC (H) 11/16/2008    130        Total Cholesterol/HDL:CHD Risk Coronary Heart Disease Risk Table                     Men   Women  1/2 Average Risk   3.4   3.3  Average Risk       5.0   4.4  2 X Average Risk   9.6   7.1  3 X Average Risk  23.4   11.0        Use the calculated Patient Ratio above and the CHD Risk Table to determine the patient's CHD Risk.        ATP III CLASSIFICATION (LDL):  <100     mg/dL   Optimal  100-129  mg/dL   Near or Above                    Optimal  130-159  mg/dL   Borderline  160-189  mg/dL   High  >190     mg/dL   Very High   ALT 57 (H) 02/03/2018   AST 76 (H) 02/03/2018   NA 143 03/19/2018   K 2.9 (A) 03/19/2018   CL 99 02/05/2018   CREATININE 1.7 (A) 03/19/2018   BUN 15 03/19/2018   CO2 24 02/05/2018   TSH 2.655 09/11/2011   INR 1.90 11/03/2017   HGBA1C  11/16/2008    5.5 (NOTE) The ADA recommends the following therapeutic goal for glycemic control related to Hgb A1c measurement: Goal of therapy: <6.5 Hgb A1c  Reference: American Diabetes Association: Clinical Practice Recommendations 2010, Diabetes Care, 2010, 33: (Suppl  1).    US Renal Transplant W/doppler  Result Date: 02/03/2018 CLINICAL DATA:  Acute renal failure. EXAM: ULTRASOUND OF RENAL TRANSPLANT WITH RENAL DOPPLER ULTRASOUND TECHNIQUE: Ultrasound examination of the renal transplant was performed with gray-scale, color and duplex doppler evaluation. Technologist describes technically difficult study with limited vascular evaluation attributed to body habitus and overlying bowel gas. COMPARISON:  None. FINDINGS: Transplant kidney location: Right lower quadrant Transplant Kidney: Length:  11 cm. Normal in size and parenchymal echogenicity. No evidence of mass or hydronephrosis. No peri-transplant fluid collection seen. Color flow in the main renal artery:  Limited evaluation Color flow in the main renal vein:  Limited evaluation  Duplex Doppler Evaluation: Main Renal Artery Resistive Index: Not obtained Venous waveform in main renal vein:  Not demonstrated Intrarenal resistive index in upper pole:  Not obtained (normal 0.6-0.8; equivocal 0.8-0.9; abnormal >= 0.9) Intrarenal resistive index in lower pole: Not obtained (normal 0.6-0.8; equivocal 0.8-0.9; abnormal >= 0.9) Bladder: Normal for degree of bladder distention. Unilateral ureteral jet documented. Other findings: There is a 8.9 x 5.5 x 6.3 cm deep subcutaneous fluid collection superior and lateral to the transplant kidney without significant mass effect upon the kidney or collecting system. IMPRESSION: 1. No hydronephrosis of transplant kidney. Limited vascular evaluation. 2. 8.9 cm deep subcutaneous fluid collection superior and lateral to the transplant kidney without significant mass effect on the kidney. Electronically Signed   By: Lucrezia Europe M.D.   On: 02/03/2018 21:52   Dg Chest Portable 1 View  Result Date: 02/03/2018 CLINICAL DATA:  Chest pain. Shortness of breath. Generalized weakness. EXAM: PORTABLE CHEST 1 VIEW COMPARISON:  Two-view chest x-ray 11/03/2017. FINDINGS: Heart is enlarged. Defibrillator pad is in place. Lung volumes are low. Mild pulmonary vascular congestion is present. No focal airspace disease is evident. IMPRESSION: 1. Cardiomegaly and mild pulmonary vascular congestion without frank edema. Early congestive heart failure is not excluded. 2. No focal airspace disease. Electronically Signed   By: San Morelle M.D.   On: 02/03/2018 16:16    Assessment & Plan:   Demia was seen today for follow-up.  Diagnoses and all orders for this visit:  Depression, unspecified depression type -     TSH; Future  Poor appetite -     TSH; Future   I am having Mariann Laster T. Rooke maintain her docusate sodium, aspirin EC, mycophenolate, albuterol, predniSONE, acetaminophen, furosemide, NONFORMULARY OR COMPOUNDED ITEM, potassium chloride SA, Magnesium,  Mouthwash Compounding Base (MOUTH WASH-GP PO), clopidogrel, tacrolimus, budesonide-formoterol, and sertraline.  No orders of the defined types were placed in this encounter.   Problem List Items Addressed This Visit      Other   Depression - Primary   Relevant Orders   TSH    Other Visit Diagnoses    Poor appetite       Relevant Orders   TSH       Follow-up: Return in about 4 weeks (around 08/26/2018) for depression and weight loss (25mins).  Wilfred Lacy, NP

## 2018-07-30 DIAGNOSIS — Z950 Presence of cardiac pacemaker: Secondary | ICD-10-CM | POA: Diagnosis not present

## 2018-07-30 DIAGNOSIS — Z45018 Encounter for adjustment and management of other part of cardiac pacemaker: Secondary | ICD-10-CM | POA: Diagnosis not present

## 2018-07-30 DIAGNOSIS — Z8673 Personal history of transient ischemic attack (TIA), and cerebral infarction without residual deficits: Secondary | ICD-10-CM | POA: Diagnosis not present

## 2018-07-30 DIAGNOSIS — I4891 Unspecified atrial fibrillation: Secondary | ICD-10-CM | POA: Diagnosis not present

## 2018-07-30 DIAGNOSIS — Z95828 Presence of other vascular implants and grafts: Secondary | ICD-10-CM | POA: Diagnosis not present

## 2018-07-30 DIAGNOSIS — Z940 Kidney transplant status: Secondary | ICD-10-CM | POA: Diagnosis not present

## 2018-07-30 DIAGNOSIS — Z95818 Presence of other cardiac implants and grafts: Secondary | ICD-10-CM | POA: Diagnosis not present

## 2018-07-30 DIAGNOSIS — Z462 Encounter for fitting and adjustment of other devices related to nervous system and special senses: Secondary | ICD-10-CM | POA: Diagnosis not present

## 2018-08-04 ENCOUNTER — Ambulatory Visit: Payer: Medicare Other | Admitting: Psychology

## 2018-08-05 NOTE — Telephone Encounter (Signed)
Open by error

## 2018-08-06 DIAGNOSIS — Z940 Kidney transplant status: Secondary | ICD-10-CM | POA: Diagnosis not present

## 2018-08-25 ENCOUNTER — Ambulatory Visit: Payer: Self-pay | Admitting: Psychology

## 2018-08-25 DIAGNOSIS — N184 Chronic kidney disease, stage 4 (severe): Secondary | ICD-10-CM | POA: Diagnosis not present

## 2018-08-25 DIAGNOSIS — Z79899 Other long term (current) drug therapy: Secondary | ICD-10-CM | POA: Diagnosis not present

## 2018-08-25 DIAGNOSIS — M109 Gout, unspecified: Secondary | ICD-10-CM | POA: Diagnosis not present

## 2018-08-25 DIAGNOSIS — I129 Hypertensive chronic kidney disease with stage 1 through stage 4 chronic kidney disease, or unspecified chronic kidney disease: Secondary | ICD-10-CM | POA: Diagnosis not present

## 2018-08-25 DIAGNOSIS — N2581 Secondary hyperparathyroidism of renal origin: Secondary | ICD-10-CM | POA: Diagnosis not present

## 2018-08-25 DIAGNOSIS — R319 Hematuria, unspecified: Secondary | ICD-10-CM | POA: Diagnosis not present

## 2018-08-25 DIAGNOSIS — D899 Disorder involving the immune mechanism, unspecified: Secondary | ICD-10-CM | POA: Diagnosis not present

## 2018-08-25 DIAGNOSIS — N2589 Other disorders resulting from impaired renal tubular function: Secondary | ICD-10-CM | POA: Diagnosis not present

## 2018-08-25 DIAGNOSIS — N189 Chronic kidney disease, unspecified: Secondary | ICD-10-CM | POA: Diagnosis not present

## 2018-08-25 DIAGNOSIS — Z940 Kidney transplant status: Secondary | ICD-10-CM | POA: Diagnosis not present

## 2018-08-25 DIAGNOSIS — N390 Urinary tract infection, site not specified: Secondary | ICD-10-CM | POA: Diagnosis not present

## 2018-08-25 DIAGNOSIS — D631 Anemia in chronic kidney disease: Secondary | ICD-10-CM | POA: Diagnosis not present

## 2018-08-25 DIAGNOSIS — R809 Proteinuria, unspecified: Secondary | ICD-10-CM | POA: Diagnosis not present

## 2018-08-25 LAB — IRON,TIBC AND FERRITIN PANEL
Iron: 40
TIBC: 229
UIBC: 189

## 2018-08-25 LAB — HEPATIC FUNCTION PANEL
ALT: 9 (ref 7–35)
ALT: 9 (ref 7–35)
AST: 15 (ref 13–35)
AST: 15 (ref 13–35)
Alkaline Phosphatase: 141 — AB (ref 25–125)
Bilirubin, Total: 0.8
Bilirubin, Total: 0.8

## 2018-08-25 LAB — BASIC METABOLIC PANEL
BUN: 31 — AB (ref 4–21)
BUN: 31 — AB (ref 4–21)
Creatinine: 2.1 — AB (ref 0.5–1.1)
Creatinine: 2.1 — AB (ref 0.5–1.1)
Glucose: 92
Glucose: 92
Potassium: 3.6 (ref 3.4–5.3)
Potassium: 3.6 (ref 3.4–5.3)
Sodium: 140 (ref 137–147)
Sodium: 140 (ref 137–147)

## 2018-08-25 LAB — CBC AND DIFFERENTIAL
HCT: 36 (ref 36–46)
HCT: 36 (ref 36–46)
Hemoglobin: 11.8 — AB (ref 12.0–16.0)
Hemoglobin: 11.8 — AB (ref 12.0–16.0)
Neutrophils Absolute: 9
Neutrophils Absolute: 9
Platelets: 282 (ref 150–399)
Platelets: 282 (ref 150–399)
WBC: 11.1
WBC: 11.1

## 2018-08-26 ENCOUNTER — Encounter: Payer: Self-pay | Admitting: Nurse Practitioner

## 2018-08-26 ENCOUNTER — Ambulatory Visit (INDEPENDENT_AMBULATORY_CARE_PROVIDER_SITE_OTHER): Payer: Medicare Other | Admitting: Nurse Practitioner

## 2018-08-26 VITALS — BP 140/76 | HR 85 | Temp 97.8°F | Ht 65.0 in | Wt 162.6 lb

## 2018-08-26 DIAGNOSIS — I10 Essential (primary) hypertension: Secondary | ICD-10-CM

## 2018-08-26 DIAGNOSIS — F322 Major depressive disorder, single episode, severe without psychotic features: Secondary | ICD-10-CM

## 2018-08-26 MED ORDER — SERTRALINE HCL 100 MG PO TABS: 100.0000 mg | ORAL_TABLET | Freq: Every day | ORAL | 1 refills | Status: DC

## 2018-08-26 NOTE — Progress Notes (Signed)
Subjective:  Patient ID: [name], female    DOB: November 10, 1945  Age: 73 y.o. MRN: [id number]  CC: Follow-up (4 wk follow up--will get colonoscopy done)  HPI Depression: Ms. Ivens reports improved mood with zoloft. She reports increased motivation to engage in activity outside of her home. She is also doing home exercises. Denies any adverse effects with medication.'did not maintain appt with psychologist, but she plans to reschedule. She did not get labs drawn as directed 63month ago. Reports she had other labs drawn by nephrology yesterday, but unable to remember names.  2lbs weight loss noted, denies any acute symptoms. Reports she feels fine and is not concerned about weight loss.  HTN: Stable without medication BP Readings from Last 3 Encounters:  08/26/18 140/76  07/29/18 122/76  07/21/18 (!) 150/90   Reviewed past Medical, Social and Family history today.  Outpatient Medications Prior to Visit  Medication Sig Dispense Refill  . acetaminophen (TYLENOL) 500 MG tablet Take 500 mg by mouth every 6 (six) hours as needed.    Marland Kitchen albuterol (PROVENTIL HFA;VENTOLIN HFA) 108 (90 Base) MCG/ACT inhaler Inhale 2 puffs into the lungs every 6 (six) hours as needed for wheezing or shortness of breath. 18 g 5  . aspirin EC 81 MG tablet Take 81 mg by mouth.    . budesonide-formoterol (SYMBICORT) 80-4.5 MCG/ACT inhaler Inhale 2 puffs into the lungs 2 (two) times daily. 1 Inhaler 6  . clopidogrel (PLAVIX) 75 MG tablet Take 75 mg by mouth daily.    Marland Kitchen docusate sodium (COLACE) 100 MG capsule Take 100 mg by mouth daily as needed (For constipation.).     Marland Kitchen furosemide (LASIX) 80 MG tablet Take 80 mg by mouth 2 (two) times daily.    . Magnesium 400 MG TABS Take by mouth 2 (two) times daily.    Earley Abide Compounding Base (MOUTH WASH-GP PO) Take by mouth. MUCOSITIS MOUTH WASH-Give 51mls by mouth 4 times daily for 10 days for oral mucositis    . mycophenolate (MYFORTIC) 180 MG EC tablet Take 180 mg by  mouth 2 (two) times daily.    . NONFORMULARY OR COMPOUNDED ITEM Compression stockings --thigh high  20-30 mm hg Dx  Low ext edema 1 each 0  . potassium chloride SA (K-DUR,KLOR-CON) 20 MEQ tablet Take 20 mEq by mouth.     . predniSONE (DELTASONE) 5 MG tablet Take 5 mg by mouth daily with breakfast.    . tacrolimus (PROGRAF) 1 MG capsule Take 2 mg by mouth daily. 2 mg po q am and 1 mg po q HS    . sertraline (ZOLOFT) 100 MG tablet Take 1 tablet (100 mg total) by mouth daily. 30 tablet 3   No facility-administered medications prior to visit.     ROS See HPI  Objective:  BP 140/76   Pulse 85   Temp 97.8 F (36.6 C) (Oral)   Ht 5\' 5"  (1.651 m)   Wt 162 lb 9.6 oz (73.8 kg)   SpO2 97%   BMI 27.06 kg/m   BP Readings from Last 3 Encounters:  08/26/18 140/76  07/29/18 122/76  07/21/18 (!) 150/90    Wt Readings from Last 3 Encounters:  08/26/18 162 lb 9.6 oz (73.8 kg)  07/29/18 164 lb 12.8 oz (74.8 kg)  07/21/18 179 lb 9.6 oz (81.5 kg)    Physical Exam Vitals signs reviewed.  Cardiovascular:     Rate and Rhythm: Normal rate and regular rhythm.  Pulmonary:  Effort: Pulmonary effort is normal.     Breath sounds: Normal breath sounds.  Musculoskeletal:     Right lower leg: No edema.     Left lower leg: No edema.  Neurological:     Mental Status: She is alert and oriented to person, place, and time.  Psychiatric:        Mood and Affect: Mood normal.        Behavior: Behavior normal.        Thought Content: Thought content normal.        Judgment: Judgment normal.    Lab Results  Component Value Date   WBC 9.8 03/19/2018   HGB 11.7 (A) 03/19/2018   HCT 36 03/19/2018   PLT 156 03/19/2018   GLUCOSE 89 02/05/2018   CHOL  11/16/2008    184        ATP III CLASSIFICATION:  <200     mg/dL   Desirable  200-239  mg/dL   Borderline High  >=240    mg/dL   High          TRIG 157 (H) 09/15/2011   HDL 33 (L) 11/16/2008   LDLCALC (H) 11/16/2008    130        Total  Cholesterol/HDL:CHD Risk Coronary Heart Disease Risk Table                     Men   Women  1/2 Average Risk   3.4   3.3  Average Risk       5.0   4.4  2 X Average Risk   9.6   7.1  3 X Average Risk  23.4   11.0        Use the calculated Patient Ratio above and the CHD Risk Table to determine the patient's CHD Risk.        ATP III CLASSIFICATION (LDL):  <100     mg/dL   Optimal  100-129  mg/dL   Near or Above                    Optimal  130-159  mg/dL   Borderline  160-189  mg/dL   High  >190     mg/dL   Very High   ALT 57 (H) 02/03/2018   AST 76 (H) 02/03/2018   NA 143 03/19/2018   K 2.9 (A) 03/19/2018   CL 99 02/05/2018   CREATININE 1.7 (A) 03/19/2018   BUN 15 03/19/2018   CO2 24 02/05/2018   TSH 2.655 09/11/2011   INR 1.90 11/03/2017   HGBA1C  11/16/2008    5.5 (NOTE) The ADA recommends the following therapeutic goal for glycemic control related to Hgb A1c measurement: Goal of therapy: <6.5 Hgb A1c  Reference: American Diabetes Association: Clinical Practice Recommendations 2010, Diabetes Care, 2010, 33: (Suppl  1).    US Renal Transplant W/doppler  Result Date: 02/03/2018 CLINICAL DATA:  Acute renal failure. EXAM: ULTRASOUND OF RENAL TRANSPLANT WITH RENAL DOPPLER ULTRASOUND TECHNIQUE: Ultrasound examination of the renal transplant was performed with gray-scale, color and duplex doppler evaluation. Technologist describes technically difficult study with limited vascular evaluation attributed to body habitus and overlying bowel gas. COMPARISON:  None. FINDINGS: Transplant kidney location: Right lower quadrant Transplant Kidney: Length: 11 cm. Normal in size and parenchymal echogenicity. No evidence of mass or hydronephrosis. No peri-transplant fluid collection seen. Color flow in the main renal artery:  Limited evaluation Color flow in the main renal vein:  Limited evaluation Duplex Doppler Evaluation: Main Renal Artery Resistive Index: Not obtained Venous waveform in main  renal vein:  Not demonstrated Intrarenal resistive index in upper pole:  Not obtained (normal 0.6-0.8; equivocal 0.8-0.9; abnormal >= 0.9) Intrarenal resistive index in lower pole: Not obtained (normal 0.6-0.8; equivocal 0.8-0.9; abnormal >= 0.9) Bladder: Normal for degree of bladder distention. Unilateral ureteral jet documented. Other findings: There is a 8.9 x 5.5 x 6.3 cm deep subcutaneous fluid collection superior and lateral to the transplant kidney without significant mass effect upon the kidney or collecting system. IMPRESSION: 1. No hydronephrosis of transplant kidney. Limited vascular evaluation. 2. 8.9 cm deep subcutaneous fluid collection superior and lateral to the transplant kidney without significant mass effect on the kidney. Electronically Signed   By: Lucrezia Europe M.D.   On: 02/03/2018 21:52   Dg Chest Portable 1 View  Result Date: 02/03/2018 CLINICAL DATA:  Chest pain. Shortness of breath. Generalized weakness. EXAM: PORTABLE CHEST 1 VIEW COMPARISON:  Two-view chest x-ray 11/03/2017. FINDINGS: Heart is enlarged. Defibrillator pad is in place. Lung volumes are low. Mild pulmonary vascular congestion is present. No focal airspace disease is evident. IMPRESSION: 1. Cardiomegaly and mild pulmonary vascular congestion without frank edema. Early congestive heart failure is not excluded. 2. No focal airspace disease. Electronically Signed   By: San Morelle M.D.   On: 02/03/2018 16:16    Assessment & Plan:   Tacha was seen today for follow-up.  Diagnoses and all orders for this visit:  Current severe episode of major depressive disorder without psychotic features without prior episode (HCC) -     sertraline (ZOLOFT) 100 MG tablet; Take 1 tablet (100 mg total) by mouth daily. -     CBC w/Diff; Future  Essential hypertension -     CBC w/Diff; Future   I am having Mariann Laster T. Bollig maintain her docusate sodium, aspirin EC, mycophenolate, albuterol, predniSONE, acetaminophen,  furosemide, NONFORMULARY OR COMPOUNDED ITEM, potassium chloride SA, Magnesium, Mouthwash Compounding Base (MOUTH WASH-GP PO), clopidogrel, tacrolimus, budesonide-formoterol, and sertraline.  Meds ordered this encounter  Medications  . sertraline (ZOLOFT) 100 MG tablet    Sig: Take 1 tablet (100 mg total) by mouth daily.    Dispense:  90 tablet    Refill:  1    F32.2    Order Specific Question:   Supervising Provider    Answer:   Lucille Passy [[id number]]    Problem List Items Addressed This Visit      Cardiovascular and Mediastinum   Essential hypertension   Relevant Orders   CBC w/Diff     Other   Depression - Primary    Ms. Milliron reports improved mood with zoloft 100mg . She reports increased motivation to engage in activity outside of her home. She is also doing home exercises. Denies any adverse effects with medication.'did not maintain appt with psychologist, but she plans to reschedule.  F/up in 68months       Relevant Medications   sertraline (ZOLOFT) 100 MG tablet   Other Relevant Orders   CBC w/Diff       Follow-up: Return in about 3 months (around 11/24/2018) for Depression (3mins).  Wilfred Lacy, NP

## 2018-08-26 NOTE — Patient Instructions (Addendum)
Please have labs draw asap if not done by nephrology : CBC, BMP, TSH Orders printed again and given to patient.  Will request lab results from nephrology.

## 2018-08-26 NOTE — Assessment & Plan Note (Signed)
[name] reports improved mood with zoloft 100mg . She reports increased motivation to engage in activity outside of her home. She is also doing home exercises. Denies any adverse effects with medication.'did not maintain appt with psychologist, but she plans to reschedule.  F/up in 65months

## 2018-09-01 ENCOUNTER — Other Ambulatory Visit: Payer: Self-pay

## 2018-09-01 ENCOUNTER — Other Ambulatory Visit: Payer: Self-pay | Admitting: Nurse Practitioner

## 2018-09-01 DIAGNOSIS — Z1231 Encounter for screening mammogram for malignant neoplasm of breast: Secondary | ICD-10-CM

## 2018-09-08 ENCOUNTER — Ambulatory Visit (INDEPENDENT_AMBULATORY_CARE_PROVIDER_SITE_OTHER): Payer: Medicare Other | Admitting: Psychology

## 2018-09-08 DIAGNOSIS — F329 Major depressive disorder, single episode, unspecified: Secondary | ICD-10-CM | POA: Diagnosis not present

## 2018-09-09 ENCOUNTER — Other Ambulatory Visit: Payer: Self-pay | Admitting: Adult Health

## 2018-09-09 DIAGNOSIS — J45901 Unspecified asthma with (acute) exacerbation: Secondary | ICD-10-CM

## 2018-09-10 ENCOUNTER — Encounter: Payer: Self-pay | Admitting: Nurse Practitioner

## 2018-09-10 ENCOUNTER — Ambulatory Visit (INDEPENDENT_AMBULATORY_CARE_PROVIDER_SITE_OTHER): Payer: Medicare Other | Admitting: Nurse Practitioner

## 2018-09-10 VITALS — BP 134/78 | HR 92 | Temp 97.8°F | Ht 65.0 in | Wt 165.0 lb

## 2018-09-10 DIAGNOSIS — R1905 Periumbilic swelling, mass or lump: Secondary | ICD-10-CM

## 2018-09-10 NOTE — Patient Instructions (Signed)
You will be contacted to schedule ABD [name].

## 2018-09-10 NOTE — Progress Notes (Signed)
Subjective:  Patient ID: [name], female    DOB: August 11, 1945  Age: 73 y.o. MRN: [id number]  CC: Cyst (pt is c/o of knot or bump on right abd area,notice 3 days)  HPI  Accompanied by husband.  [name] present with ABD mass noted 3days ago, denies any nausea or dyspesia or ABD pain or change in bowel movement or ABD distension or GU symptoms. 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  Reviewed past Medical, Social and Family history today.  Outpatient Medications Prior to Visit  Medication Sig Dispense Refill  . acetaminophen (TYLENOL) 500 MG tablet Take 500 mg by mouth every 6 (six) hours as needed.    Marland Kitchen albuterol (PROVENTIL HFA;VENTOLIN HFA) 108 (90 Base) MCG/ACT inhaler Inhale 2 puffs into the lungs every 6 (six) hours as needed for wheezing or shortness of breath. 18 g 5  . aspirin EC 81 MG tablet Take 81 mg by mouth.    . budesonide-formoterol (SYMBICORT) 80-4.5 MCG/ACT inhaler Inhale 2 puffs into the lungs 2 (two) times daily. 1 Inhaler 6  . calcitRIOL (ROCALTROL) 0.25 MCG capsule Take by mouth daily.    . clopidogrel (PLAVIX) 75 MG tablet Take 75 mg by mouth daily.    Marland Kitchen docusate sodium (COLACE) 100 MG capsule Take 100 mg by mouth daily as needed (For constipation.).     Marland Kitchen furosemide (LASIX) 80 MG tablet Take 80 mg by mouth 2 (two) times daily.    . Magnesium 400 MG TABS Take by mouth 2 (two) times daily.    Earley Abide Compounding Base (MOUTH WASH-GP PO) Take by mouth. MUCOSITIS MOUTH WASH-Give 75mls by mouth 4 times daily for 10 days for oral mucositis    . mycophenolate (MYFORTIC) 180 MG EC tablet Take 180 mg by mouth 2 (two) times daily.    . NONFORMULARY OR COMPOUNDED ITEM Compression stockings --thigh high  20-30 mm hg Dx  Low ext edema 1 each 0  . potassium chloride SA (K-DUR,KLOR-CON) 20 MEQ tablet Take 20 mEq by mouth.     . predniSONE (DELTASONE) 5 MG tablet Take 5 mg by mouth daily with breakfast.    . sertraline (ZOLOFT) 100 MG tablet Take 1  tablet (100 mg total) by mouth daily. 90 tablet 1  . SYMBICORT 160-4.5 MCG/ACT inhaler TAKE 2 PUFFS BY MOUTH TWICE A DAY 30.6 Inhaler 1  . tacrolimus (PROGRAF) 1 MG capsule Take 2 mg by mouth daily. 2 mg po q am and 1 mg po q HS     No facility-administered medications prior to visit.     ROS See HPI  Objective:  BP 134/78   Pulse 92   Temp 97.8 F (36.6 C) (Oral)   Ht 5\' 5"  (1.651 m)   Wt 165 lb (74.8 kg)   SpO2 97%   BMI 27.46 kg/m   BP Readings from Last 3 Encounters:  09/10/18 134/78  08/26/18 140/76  07/29/18 122/76    Wt Readings from Last 3 Encounters:  09/10/18 165 lb (74.8 kg)  08/26/18 162 lb 9.6 oz (73.8 kg)  07/29/18 164 lb 12.8 oz (74.8 kg)    Physical Exam Cardiovascular:     Rate and Rhythm: Normal rate.  Pulmonary:     Effort: Pulmonary effort is normal.  Abdominal:     General: Bowel sounds are normal.     Palpations: Abdomen is soft. There is mass.     Tenderness: There is no abdominal tenderness. There is no guarding.  Skin:    Findings: No erythema or rash.  Neurological:     Mental Status: She is oriented to person, place, and time.     Lab Results  Component Value Date   WBC 9.8 03/19/2018   HGB 11.7 (A) 03/19/2018   HCT 36 03/19/2018   PLT 156 03/19/2018   GLUCOSE 89 02/05/2018   CHOL  11/16/2008    184        ATP III CLASSIFICATION:  <200     mg/dL   Desirable  200-239  mg/dL   Borderline High  >=240    mg/dL   High          TRIG 157 (H) 09/15/2011   HDL 33 (L) 11/16/2008   LDLCALC (H) 11/16/2008    130        Total Cholesterol/HDL:CHD Risk Coronary Heart Disease Risk Table                     Men   Women  1/2 Average Risk   3.4   3.3  Average Risk       5.0   4.4  2 X Average Risk   9.6   7.1  3 X Average Risk  23.4   11.0        Use the calculated Patient Ratio above and the CHD Risk Table to determine the patient's CHD Risk.        ATP III CLASSIFICATION (LDL):  <100     mg/dL   Optimal  100-129  mg/dL    Near or Above                    Optimal  130-159  mg/dL   Borderline  160-189  mg/dL   High  >190     mg/dL   Very High   ALT 57 (H) 02/03/2018   AST 76 (H) 02/03/2018   NA 143 03/19/2018   K 2.9 (A) 03/19/2018   CL 99 02/05/2018   CREATININE 1.7 (A) 03/19/2018   BUN 15 03/19/2018   CO2 24 02/05/2018   TSH 2.655 09/11/2011   INR 1.90 11/03/2017   HGBA1C  11/16/2008    5.5 (NOTE) The ADA recommends the following therapeutic goal for glycemic control related to Hgb A1c measurement: Goal of therapy: <6.5 Hgb A1c  Reference: American Diabetes Association: Clinical Practice Recommendations 2010, Diabetes Care, 2010, 33: (Suppl  1).    US Renal Transplant W/doppler  Result Date: 02/03/2018 CLINICAL DATA:  Acute renal failure. EXAM: ULTRASOUND OF RENAL TRANSPLANT WITH RENAL DOPPLER ULTRASOUND TECHNIQUE: Ultrasound examination of the renal transplant was performed with gray-scale, color and duplex doppler evaluation. Technologist describes technically difficult study with limited vascular evaluation attributed to body habitus and overlying bowel gas. COMPARISON:  None. FINDINGS: Transplant kidney location: Right lower quadrant Transplant Kidney: Length: 11 cm. Normal in size and parenchymal echogenicity. No evidence of mass or hydronephrosis. No peri-transplant fluid collection seen. Color flow in the main renal artery:  Limited evaluation Color flow in the main renal vein:  Limited evaluation Duplex Doppler Evaluation: Main Renal Artery Resistive Index: Not obtained Venous waveform in main renal vein:  Not demonstrated Intrarenal resistive index in upper pole:  Not obtained (normal 0.6-0.8; equivocal 0.8-0.9; abnormal >= 0.9) Intrarenal resistive index in lower pole: Not obtained (normal 0.6-0.8; equivocal 0.8-0.9; abnormal >= 0.9) Bladder: Normal for degree of bladder distention. Unilateral ureteral jet documented. Other findings: There is a 8.9 x 5.5 x  6.3 cm deep subcutaneous fluid collection  superior and lateral to the transplant kidney without significant mass effect upon the kidney or collecting system. IMPRESSION: 1. No hydronephrosis of transplant kidney. Limited vascular evaluation. 2. 8.9 cm deep subcutaneous fluid collection superior and lateral to the transplant kidney without significant mass effect on the kidney. Electronically Signed   By: Lucrezia Europe M.D.   On: 02/03/2018 21:52   Dg Chest Portable 1 View  Result Date: 02/03/2018 CLINICAL DATA:  Chest pain. Shortness of breath. Generalized weakness. EXAM: PORTABLE CHEST 1 VIEW COMPARISON:  Two-view chest x-ray 11/03/2017. FINDINGS: Heart is enlarged. Defibrillator pad is in place. Lung volumes are low. Mild pulmonary vascular congestion is present. No focal airspace disease is evident. IMPRESSION: 1. Cardiomegaly and mild pulmonary vascular congestion without frank edema. Early congestive heart failure is not excluded. 2. No focal airspace disease. Electronically Signed   By: San Morelle M.D.   On: 02/03/2018 16:16    Assessment & Plan:   Avanna was seen today for cyst.  Diagnoses and all orders for this visit:  Periumbilical mass -     US Abdomen Complete   I am having [name] maintain her docusate sodium, aspirin EC, mycophenolate, albuterol, predniSONE, acetaminophen, furosemide, NONFORMULARY OR COMPOUNDED ITEM, potassium chloride SA, Magnesium, Mouthwash Compounding Base (MOUTH WASH-GP PO), clopidogrel, tacrolimus, budesonide-formoterol, sertraline, SYMBICORT, and calcitRIOL.  No orders of the defined types were placed in this encounter.   Problem List Items Addressed This Visit    None    Visit Diagnoses    Periumbilical mass    -  Primary   Relevant Orders   US Abdomen Complete       Follow-up: No follow-ups on file.  Wilfred Lacy, NP

## 2018-09-11 ENCOUNTER — Ambulatory Visit (HOSPITAL_BASED_OUTPATIENT_CLINIC_OR_DEPARTMENT_OTHER)
Admission: RE | Admit: 2018-09-11 | Discharge: 2018-09-11 | Disposition: A | Discharge status: Home / Self Care | Payer: Medicare Other | Source: Ambulatory Visit | Attending: Nurse Practitioner | Admitting: Nurse Practitioner

## 2018-09-11 DIAGNOSIS — R1905 Periumbilic swelling, mass or lump: Secondary | ICD-10-CM | POA: Diagnosis not present

## 2018-09-16 ENCOUNTER — Encounter: Payer: Self-pay | Admitting: Nurse Practitioner

## 2018-09-16 DIAGNOSIS — Z7902 Long term (current) use of antithrombotics/antiplatelets: Secondary | ICD-10-CM | POA: Diagnosis not present

## 2018-09-16 DIAGNOSIS — Z86711 Personal history of pulmonary embolism: Secondary | ICD-10-CM | POA: Diagnosis not present

## 2018-09-16 DIAGNOSIS — I499 Cardiac arrhythmia, unspecified: Secondary | ICD-10-CM | POA: Diagnosis not present

## 2018-09-16 DIAGNOSIS — I5030 Unspecified diastolic (congestive) heart failure: Secondary | ICD-10-CM | POA: Diagnosis not present

## 2018-09-16 DIAGNOSIS — I480 Paroxysmal atrial fibrillation: Secondary | ICD-10-CM | POA: Diagnosis not present

## 2018-09-16 DIAGNOSIS — Z95818 Presence of other cardiac implants and grafts: Secondary | ICD-10-CM | POA: Diagnosis not present

## 2018-09-16 DIAGNOSIS — Z006 Encounter for examination for normal comparison and control in clinical research program: Secondary | ICD-10-CM | POA: Diagnosis not present

## 2018-09-16 DIAGNOSIS — T82538A Leakage of other cardiac and vascular devices and implants, initial encounter: Secondary | ICD-10-CM | POA: Diagnosis not present

## 2018-09-16 DIAGNOSIS — I4891 Unspecified atrial fibrillation: Secondary | ICD-10-CM | POA: Diagnosis not present

## 2018-09-16 DIAGNOSIS — Z950 Presence of cardiac pacemaker: Secondary | ICD-10-CM | POA: Diagnosis not present

## 2018-09-16 DIAGNOSIS — I4819 Other persistent atrial fibrillation: Secondary | ICD-10-CM | POA: Diagnosis not present

## 2018-09-16 DIAGNOSIS — N186 End stage renal disease: Secondary | ICD-10-CM | POA: Diagnosis not present

## 2018-09-16 DIAGNOSIS — I255 Ischemic cardiomyopathy: Secondary | ICD-10-CM | POA: Diagnosis not present

## 2018-09-16 DIAGNOSIS — Z940 Kidney transplant status: Secondary | ICD-10-CM | POA: Diagnosis not present

## 2018-09-16 DIAGNOSIS — I110 Hypertensive heart disease with heart failure: Secondary | ICD-10-CM | POA: Diagnosis present

## 2018-09-16 DIAGNOSIS — I5023 Acute on chronic systolic (congestive) heart failure: Secondary | ICD-10-CM | POA: Diagnosis not present

## 2018-09-16 DIAGNOSIS — Z86718 Personal history of other venous thrombosis and embolism: Secondary | ICD-10-CM | POA: Diagnosis not present

## 2018-09-16 DIAGNOSIS — I4892 Unspecified atrial flutter: Secondary | ICD-10-CM | POA: Diagnosis not present

## 2018-09-16 DIAGNOSIS — I132 Hypertensive heart and chronic kidney disease with heart failure and with stage 5 chronic kidney disease, or end stage renal disease: Secondary | ICD-10-CM | POA: Diagnosis not present

## 2018-09-16 DIAGNOSIS — Z7982 Long term (current) use of aspirin: Secondary | ICD-10-CM | POA: Diagnosis not present

## 2018-09-16 NOTE — Progress Notes (Signed)
Abstracted sent to scan.

## 2018-09-23 ENCOUNTER — Encounter: Payer: Self-pay | Admitting: Nurse Practitioner

## 2018-09-23 LAB — PHOSPHORUS: Phosphorus: 3.2

## 2018-09-23 LAB — MAGNESIUM: Magnesium: 2.3

## 2018-09-23 NOTE — Progress Notes (Signed)
Abstracted result and sent to scan  

## 2018-09-25 ENCOUNTER — Ambulatory Visit
Admission: RE | Admit: 2018-09-25 | Discharge: 2018-09-25 | Disposition: A | Discharge status: Home / Self Care | Payer: Medicare Other | Source: Ambulatory Visit | Attending: Nurse Practitioner | Admitting: Nurse Practitioner

## 2018-09-25 ENCOUNTER — Other Ambulatory Visit: Payer: Self-pay

## 2018-09-25 DIAGNOSIS — Z1231 Encounter for screening mammogram for malignant neoplasm of breast: Secondary | ICD-10-CM | POA: Diagnosis not present

## 2018-09-25 DIAGNOSIS — K5900 Constipation, unspecified: Secondary | ICD-10-CM | POA: Diagnosis not present

## 2018-09-25 DIAGNOSIS — R634 Abnormal weight loss: Secondary | ICD-10-CM | POA: Diagnosis not present

## 2018-09-25 DIAGNOSIS — Z1211 Encounter for screening for malignant neoplasm of colon: Secondary | ICD-10-CM | POA: Diagnosis not present

## 2018-09-25 DIAGNOSIS — D509 Iron deficiency anemia, unspecified: Secondary | ICD-10-CM | POA: Diagnosis not present

## 2018-09-27 ENCOUNTER — Encounter: Payer: Self-pay | Admitting: Nurse Practitioner

## 2018-10-07 DIAGNOSIS — N2581 Secondary hyperparathyroidism of renal origin: Secondary | ICD-10-CM | POA: Diagnosis not present

## 2018-10-08 DIAGNOSIS — I4892 Unspecified atrial flutter: Secondary | ICD-10-CM | POA: Diagnosis not present

## 2018-10-08 DIAGNOSIS — Z7901 Long term (current) use of anticoagulants: Secondary | ICD-10-CM | POA: Diagnosis not present

## 2018-10-08 DIAGNOSIS — Z95818 Presence of other cardiac implants and grafts: Secondary | ICD-10-CM | POA: Diagnosis not present

## 2018-10-08 DIAGNOSIS — Z1211 Encounter for screening for malignant neoplasm of colon: Secondary | ICD-10-CM | POA: Diagnosis not present

## 2018-10-08 DIAGNOSIS — Z1212 Encounter for screening for malignant neoplasm of rectum: Secondary | ICD-10-CM | POA: Diagnosis not present

## 2018-10-08 DIAGNOSIS — Z950 Presence of cardiac pacemaker: Secondary | ICD-10-CM | POA: Diagnosis not present

## 2018-10-08 DIAGNOSIS — Z9889 Other specified postprocedural states: Secondary | ICD-10-CM | POA: Insufficient documentation

## 2018-10-08 DIAGNOSIS — N189 Chronic kidney disease, unspecified: Secondary | ICD-10-CM | POA: Diagnosis not present

## 2018-10-08 DIAGNOSIS — Z8679 Personal history of other diseases of the circulatory system: Secondary | ICD-10-CM | POA: Diagnosis not present

## 2018-10-08 DIAGNOSIS — Z7982 Long term (current) use of aspirin: Secondary | ICD-10-CM | POA: Diagnosis not present

## 2018-10-08 DIAGNOSIS — I4821 Permanent atrial fibrillation: Secondary | ICD-10-CM | POA: Diagnosis not present

## 2018-10-08 LAB — COLOGUARD: Cologuard: NEGATIVE

## 2018-10-13 ENCOUNTER — Ambulatory Visit (INDEPENDENT_AMBULATORY_CARE_PROVIDER_SITE_OTHER): Payer: Medicare Other | Admitting: Psychology

## 2018-10-13 DIAGNOSIS — F329 Major depressive disorder, single episode, unspecified: Secondary | ICD-10-CM | POA: Diagnosis not present

## 2018-10-14 ENCOUNTER — Encounter: Payer: Self-pay | Admitting: Nurse Practitioner

## 2018-10-14 NOTE — Progress Notes (Signed)
Abstracted result and sent to scan  

## 2018-10-27 ENCOUNTER — Ambulatory Visit (INDEPENDENT_AMBULATORY_CARE_PROVIDER_SITE_OTHER): Payer: Medicare Other | Admitting: Psychology

## 2018-10-27 DIAGNOSIS — F329 Major depressive disorder, single episode, unspecified: Secondary | ICD-10-CM | POA: Diagnosis not present

## 2018-10-29 DIAGNOSIS — Z45018 Encounter for adjustment and management of other part of cardiac pacemaker: Secondary | ICD-10-CM | POA: Diagnosis not present

## 2018-10-29 DIAGNOSIS — Z45010 Encounter for checking and testing of cardiac pacemaker pulse generator [battery]: Secondary | ICD-10-CM | POA: Diagnosis not present

## 2018-10-29 DIAGNOSIS — I4891 Unspecified atrial fibrillation: Secondary | ICD-10-CM | POA: Diagnosis not present

## 2018-11-05 ENCOUNTER — Ambulatory Visit (INDEPENDENT_AMBULATORY_CARE_PROVIDER_SITE_OTHER): Payer: Medicare Other | Admitting: Nurse Practitioner

## 2018-11-05 ENCOUNTER — Other Ambulatory Visit: Payer: Self-pay

## 2018-11-05 ENCOUNTER — Encounter: Payer: Self-pay | Admitting: Nurse Practitioner

## 2018-11-05 ENCOUNTER — Ambulatory Visit: Payer: Self-pay

## 2018-11-05 VITALS — BP 124/70 | Ht 65.0 in | Wt 157.8 lb

## 2018-11-05 DIAGNOSIS — J4531 Mild persistent asthma with (acute) exacerbation: Secondary | ICD-10-CM

## 2018-11-05 MED ORDER — ALBUTEROL SULFATE (2.5 MG/3ML) 0.083% IN NEBU: 2.5000 mg | INHALATION_SOLUTION | Freq: Four times a day (QID) | RESPIRATORY_TRACT | 0 refills | Status: DC | PRN

## 2018-11-05 MED ORDER — DEXTROMETHORPHAN POLISTIREX ER 30 MG/5ML PO SUER: 30.0000 mg | Freq: Two times a day (BID) | ORAL | 0 refills | Status: DC

## 2018-11-05 MED ORDER — BENZONATATE 100 MG PO CAPS: 100.0000 mg | ORAL_CAPSULE | Freq: Three times a day (TID) | ORAL | 0 refills | Status: DC | PRN

## 2018-11-05 NOTE — Telephone Encounter (Signed)
Pt called stating that she has a dry cough mainly at night.  She states that she has asthma so she has been using her nebulizer.. she denies other symptoms. No fever no SOB no cold symptoms.  She states that she was able once to cough up a thick cream colored glob. Pt has significant heart Hx to include A fib and watchman procedure last march.  She has asthma.  She is on blood thinner. Per protocol 4 attempts were made to contact scheduling at the office. Will route conversation high priority to office. Pt will wait for call.  Reason for Disposition . Cough has been present for > 3 weeks  Answer Assessment - Initial Assessment Questions 1. ONSET: "When did the cough begin?"      2week 2. SEVERITY: "How bad is the cough today?"      Moderate expecially at night 3. RESPIRATORY DISTRESS: "Describe your breathing."      ok 4. FEVER: "Do you have a fever?" If so, ask: "What is your temperature, how was it measured, and when did it start?"     no 5. HEMOPTYSIS: "Are you coughing up any blood?" If so ask: "How much?" (flecks, streaks, tablespoons, etc.)     no 6. TREATMENT: "What have you done so far to treat the cough?" (e.g., meds, fluids, humidifier)     nebulizer 7. CARDIAC HISTORY: "Do you have any history of heart disease?" (e.g., heart attack, congestive heart failure)     Watchman A fib 8. LUNG HISTORY: "Do you have any history of lung disease?"  (e.g., pulmonary embolus, asthma, emphysema)    asthma 9. PE RISK FACTORS: "Do you have a history of blood clots?" (or: recent major surgery, recent prolonged travel, bedridden)     no 10. OTHER SYMPTOMS: "Do you have any other symptoms? (e.g., runny nose, wheezing, chest pain)       no 11. PREGNANCY: "Is there any chance you are pregnant?" "When was your last menstrual period?"       no 12. TRAVEL: "Have you traveled out of the country in the last month?" (e.g., travel history, exposures)       no  Protocols used: COUGH - ACUTE  NON-PRODUCTIVE-A-AH

## 2018-11-05 NOTE — Telephone Encounter (Signed)
appt made today

## 2018-11-05 NOTE — Patient Instructions (Signed)
Continue use of albuterol AM and HS. Continue symbicort as prescribed. Alternate between benzonatate and delsym as needed for cough.   Asthma Attack  Acute bronchospasm caused by asthma is also referred to as an asthma attack. Bronchospasm means that the air passages become narrowed or "tight," which limits the amount of oxygen that can get into the lungs. The narrowing is caused by inflammation and tightening of the muscles in the air tubes (bronchi) in the lungs. Excessive mucus is also produced, which narrows the airways more. This can cause trouble breathing, coughing, and loud breathing (wheezing). What are the causes? Possible triggers include:  Animal dander from the skin, hair, or feathers of animals.  Dust mites contained in house dust.  Cockroaches.  Pollen from trees or grass.  Mold.  Cigarette or tobacco smoke.  Air pollutants such as dust, household cleaners, hair sprays, aerosol sprays, paint fumes, strong chemicals, or strong odors.  Cold air or weather changes. Cold air may trigger inflammation. Winds increase molds and pollens in the air.  Strong emotions such as crying or laughing hard.  Stress.  Certain medicines, such as aspirin or beta-blockers.  Sulfites in foods and drinks, such as dried fruits and wine.  Infections or inflammatory conditions, such as a flu, a cold, pneumonia, or inflammation of the nasal membranes (rhinitis).  Gastroesophageal reflux disease (GERD). GERD is a condition in which stomach acid backs up into your esophagus, which can irritate nearby airway structures.  Exercise or activity that requires a lot of energy. What are the signs or symptoms? Symptoms of this condition include:  Wheezing. This may sound like whistling while breathing. This may be more noticeable at night.  Excessive coughing, particularly at night.  Chest tightness or pain.  Shortness of breath.  Feeling like you cannot get enough air no matter how hard  you try (air hunger). How is this diagnosed? This condition may be diagnosed based on:  Your medical history.  Your symptoms.  A physical exam.  Tests to check for other causes of your symptoms or other conditions that may have triggered your asthma attack. These tests may include: ? Chest X-ray. ? Blood tests. ? Specialized tests to assess lung function, such as breathing into a device that measures how much air you inhale and exhale (spirometry). How is this treated? The goal of treatment is to open the airways in your lungs and reduce inflammation. Most asthma attacks are treated with medicines that you inhale through a hand-held inhaler (metered dose inhaler, MDI) or a device that turns liquid medicine into a mist that you inhale (nebulizer). Medicines may include:  Quick relief or rescue medicines that relax the muscles of the bronchi. These medicines include bronchodilators, such as albuterol.  Controller medicines, such as inhaled corticosteroids. These are long-acting medicines that are used for daily asthma maintenance. If you have a moderate or severe asthma attack, you may be treated with steroid medicines by mouth or through an IV injection at the hospital. Steroid medicines reduce inflammation in your lungs. Depending on the severity of your attack, you may need oxygen therapy to help you breathe. If your asthma attack was caused by a bacterial infection, such as pneumonia, you will be given antibiotic medicines. Follow these instructions at home: Medicines  Take over-the-counter and prescription medicines only as told by your health care provider. Keep your medicines up-to-date and available.  If you are more than [redacted] weeks pregnant and you are prescribed any new medicines, tell your obstetrician  about those medicines.  If you were prescribed an antibiotic medicine, take it as told by your health care provider. Do not stop taking the antibiotic even if you start to feel  better. Avoiding triggers   Keep track of things that trigger your asthma attacks or cause you to have breathing problems, and avoid exposure to these triggers.  Do not use any products that contain nicotine or tobacco, such as cigarettes and e-cigarettes. If you need help quitting, ask your health care provider.  Avoid secondhand smoke.  Avoid strong smells, such as perfumes, aerosols, and cleaning solvents.  When pollen or air pollution is bad, keep windows closed and use an air conditioner or go to places with air conditioning. Asthma action plan  Work with your health care provider to make a written plan for managing and treating your asthma attacks (asthma action plan). This plan should include: ? A list of your asthma triggers and how to avoid them. ? Information about when your medicines should be taken and when their dosage should be changed. ? Instructions about using a device called a peak flow meter to monitor your condition. A peak flow meter measures how well your lungs are working and measures how severe your asthma is at a given time. Your "personal best" is the highest peak flow rate you can reach when you feel good and have no asthma symptoms. General instructions  Avoid excessive exercise or activity until your asthma attack resolves. Ask your health care provider what activities are safe for you and when you can return to your normal activities.  Stay up to date on all vaccinations recommended by your health care provider, such as flu and pneumonia vaccines.  Drink enough fluid to keep your urine clear or pale yellow. Staying hydrated helps keep mucus in your lungs thin so it can be coughed up easily.  If you drink caffeine, do so in moderation.  Do not use alcohol until you have recovered.  Keep all follow-up visits as told by your health care provider. This is important. Asthma requires careful medical care, and you and your health care provider can work together  to reduce the likelihood of future attacks. Contact a health care provider if:  Your peak flow reading is still at 50-79% of your personal best after you have followed your action plan for 1 hour. This is in the yellow zone, which means "caution."  You need to use a reliever medicine more than 2-3 times a week.  Your medicines are causing side effects, such as: ? Rash. ? Itching. ? Swelling. ? Trouble breathing.  Your symptoms do not improve after 48 hours.  You cough up mucus (sputum) that is thicker than usual.  You have a fever.  You need to use your medicines much more frequently than normal. Get help right away if:  Your peak flow reading is less than 50% of your personal best. This is in the red zone, which means "danger."  You have severe trouble breathing.  You develop chest pain or discomfort.  Your medicines no longer seem to be helping.  You vomit.  You cannot eat or drink without vomiting.  You are coughing up yellow, green, brown, or bloody mucus.  You have a fever and your symptoms suddenly get worse.  You have trouble swallowing.  You feel very tired, and breathing becomes tiring. Summary  Acute bronchospasm caused by asthma is also referred to as an asthma attack.  Bronchospasm is caused by narrowing  or tightness in air passages, which causes shortness of breath, coughing, and loud breathing (wheezing).  Many things can trigger an asthma attack, such as allergens, weather changes, exercise, smoke, and other fumes.  Treatment for an asthma attack may include inhaled rescue medicines for immediate relief, as well as the use of maintenance therapy.  Get help right away if you have worsening shortness of breath, chest pain, or fever, or if your home medicines are no longer helping with your symptoms. This information is not intended to replace advice given to you by your health care provider. Make sure you discuss any questions you have with your health  care provider. Document Released: 10/17/2006 Document Revised: 08/03/2016 Document Reviewed: 08/03/2016 Elsevier Interactive Patient Education  2019 [employer].

## 2018-11-05 NOTE — Progress Notes (Signed)
Virtual Visit via Video Note  I connected with [name] on 11/05/18 at 11:00 AM EDT by a video enabled telemedicine application and verified that I am speaking with the correct person using two identifiers.   I discussed the limitations of evaluation and management by telemedicine and the availability of in person appointments. The patient expressed understanding and agreed to proceed.  CC: pt is c/o of coughing,bad at night/going on 2 wk/ using nebulizer at home/ inhaler consult. Husband present during video call.  History of Present Illness: Cough  This is a recurrent problem. The current episode started in the past 7 days. The problem has been gradually worsening. The cough is non-productive. Associated symptoms include shortness of breath. Pertinent negatives include no chest pain, chills, fever, headaches, heartburn, myalgias, nasal congestion, postnasal drip, rhinorrhea, sore throat, sweats or wheezing. The symptoms are aggravated by lying down. She has tried a beta-agonist inhaler and steroid inhaler for the symptoms. The treatment provided significant relief. Her past medical history is significant for asthma and bronchitis.   Wt Readings from Last 3 Encounters:  11/05/18 157 lb 12.8 oz (71.6 kg)  09/10/18 165 lb (74.8 kg)  08/26/18 162 lb 9.6 oz (73.8 kg)   BP Readings from Last 3 Encounters:  11/05/18 124/70  09/10/18 134/78  08/26/18 140/76   Observations/Objective: Physical Exam  Constitutional: She is oriented to person, place, and time. No distress.  Pulmonary/Chest: Effort normal.  Neurological: She is alert and oriented to person, place, and time.  Psychiatric: She has a normal mood and affect. Her behavior is normal. Thought content normal.  Vitals reviewed.  Assessment and Plan: [name] was seen today for cough.  Diagnoses and all orders for this visit:  Mild persistent asthma with acute exacerbation -     benzonatate (TESSALON) 100 MG capsule; Take 1-2  capsules (100-200 mg total) by mouth 3 (three) times daily as needed. -     dextromethorphan (DELSYM) 30 MG/5ML liquid; Take 5 mLs (30 mg total) by mouth 2 (two) times daily. -     albuterol (PROVENTIL) (2.5 MG/3ML) 0.083% nebulizer solution; Take 3 mLs (2.5 mg total) by nebulization every 6 (six) hours as needed for wheezing or shortness of breath.   Follow Up Instructions: Continue use of albuterol AM and HS. Continue symbicort as prescribed. Alternate between benzonatate and delsym as needed for cough. Call office if symptoms worsen or do not continue to improve.   I discussed the assessment and treatment plan with the patient. The patient was provided an opportunity to ask questions and all were answered. The patient agreed with the plan and demonstrated an understanding of the instructions.   The patient was advised to call back or seek an in-person evaluation if the symptoms worsen or if the condition fails to improve as anticipated.   Wilfred Lacy, NP

## 2018-11-10 ENCOUNTER — Telehealth: Payer: Self-pay | Admitting: Primary Care

## 2018-11-10 NOTE — Telephone Encounter (Signed)
Called patient and informed that she will need to contact her insurance company. She needs them to provide her with a list of medication covered that are in same class as Symbicort. Pt is to then call office back and provide [name] with names of medications so that we can pass along to provider. Pt verbalized understanding Nothing further needed until patient calls office back with medicatopn update.

## 2018-11-12 ENCOUNTER — Ambulatory Visit (INDEPENDENT_AMBULATORY_CARE_PROVIDER_SITE_OTHER): Payer: Medicare Other | Admitting: Psychology

## 2018-11-12 DIAGNOSIS — F329 Major depressive disorder, single episode, unspecified: Secondary | ICD-10-CM

## 2018-11-14 ENCOUNTER — Telehealth: Payer: Self-pay

## 2018-11-14 ENCOUNTER — Ambulatory Visit (INDEPENDENT_AMBULATORY_CARE_PROVIDER_SITE_OTHER): Payer: Medicare Other

## 2018-11-14 ENCOUNTER — Other Ambulatory Visit: Payer: Medicare Other

## 2018-11-14 ENCOUNTER — Other Ambulatory Visit: Payer: Self-pay

## 2018-11-14 DIAGNOSIS — R05 Cough: Secondary | ICD-10-CM

## 2018-11-14 DIAGNOSIS — R059 Cough, unspecified: Secondary | ICD-10-CM

## 2018-11-14 IMAGING — DX CHEST - 2 VIEW
2 series · 2 of 2 positions shown · non-contrast
Comparison: [DATE]

CLINICAL DATA: Productive cough. Cardiomyopathy and diastolic
congestive heart failure. Renal transplant patient.

EXAM:
CHEST - 2 VIEW

[chest pa]
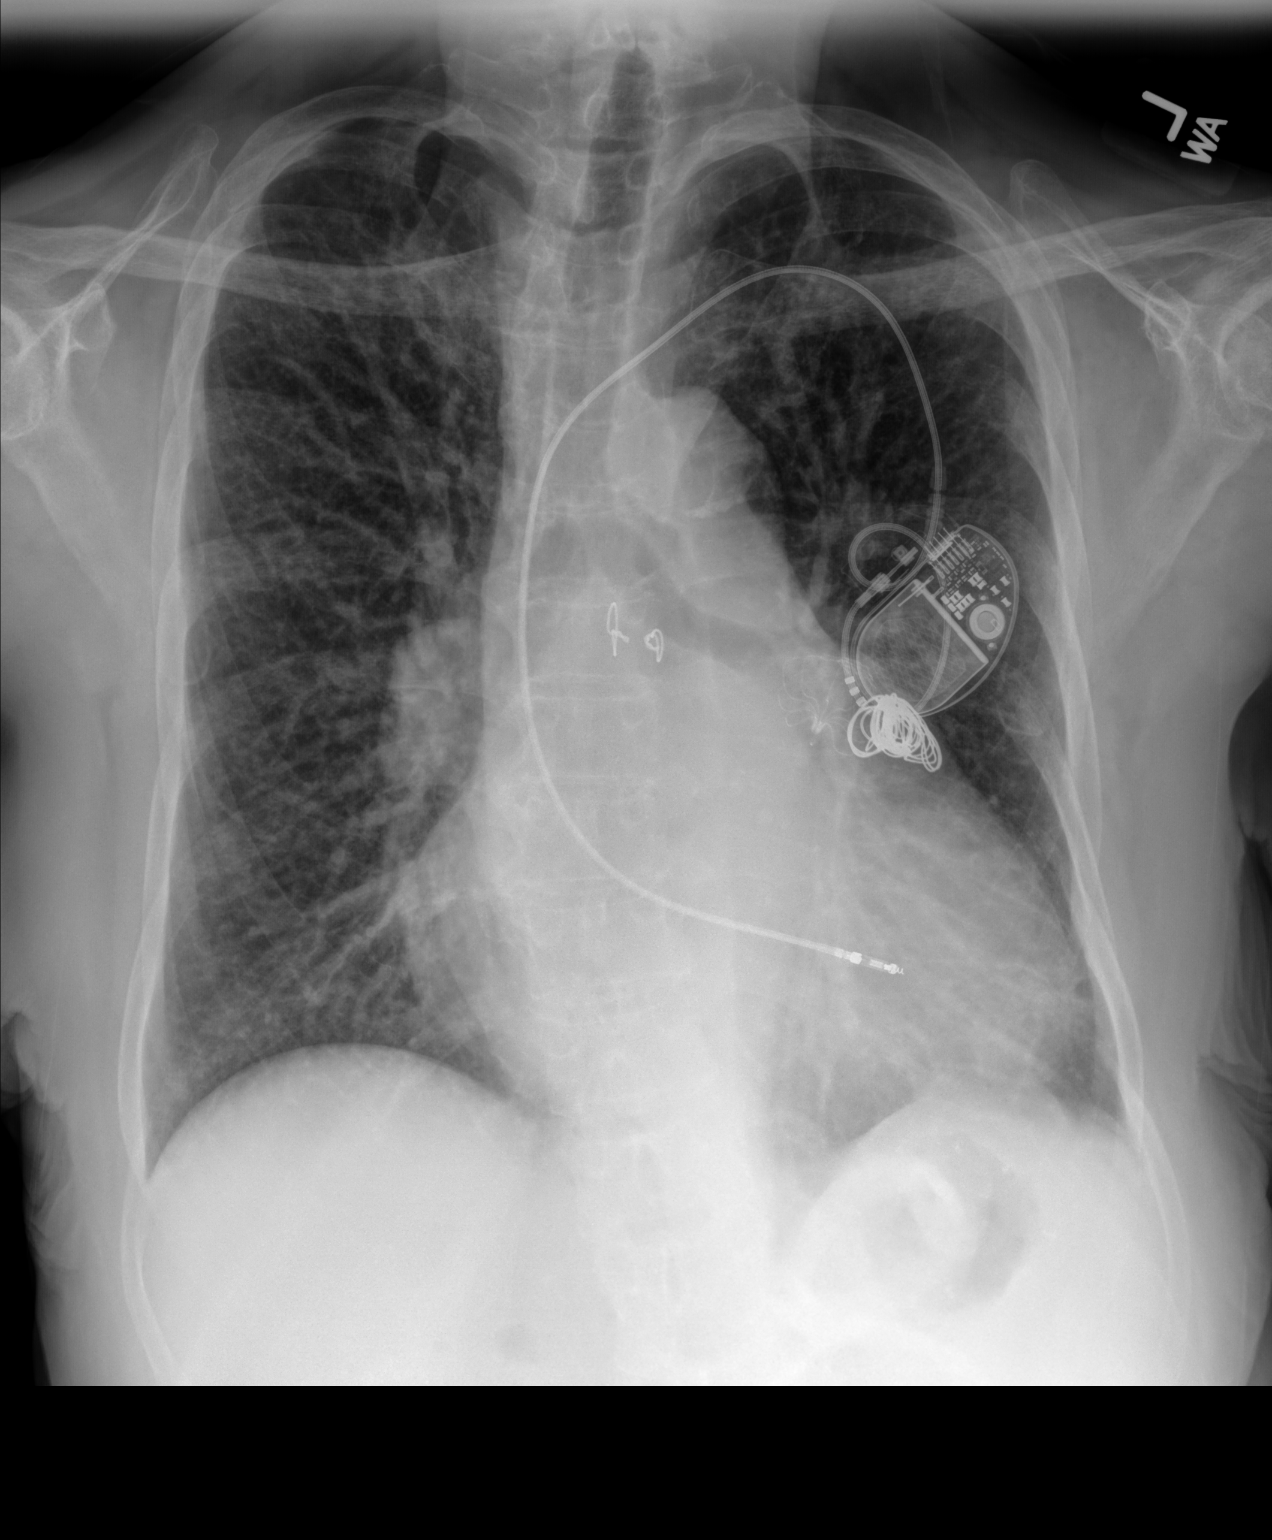

[chest lat]
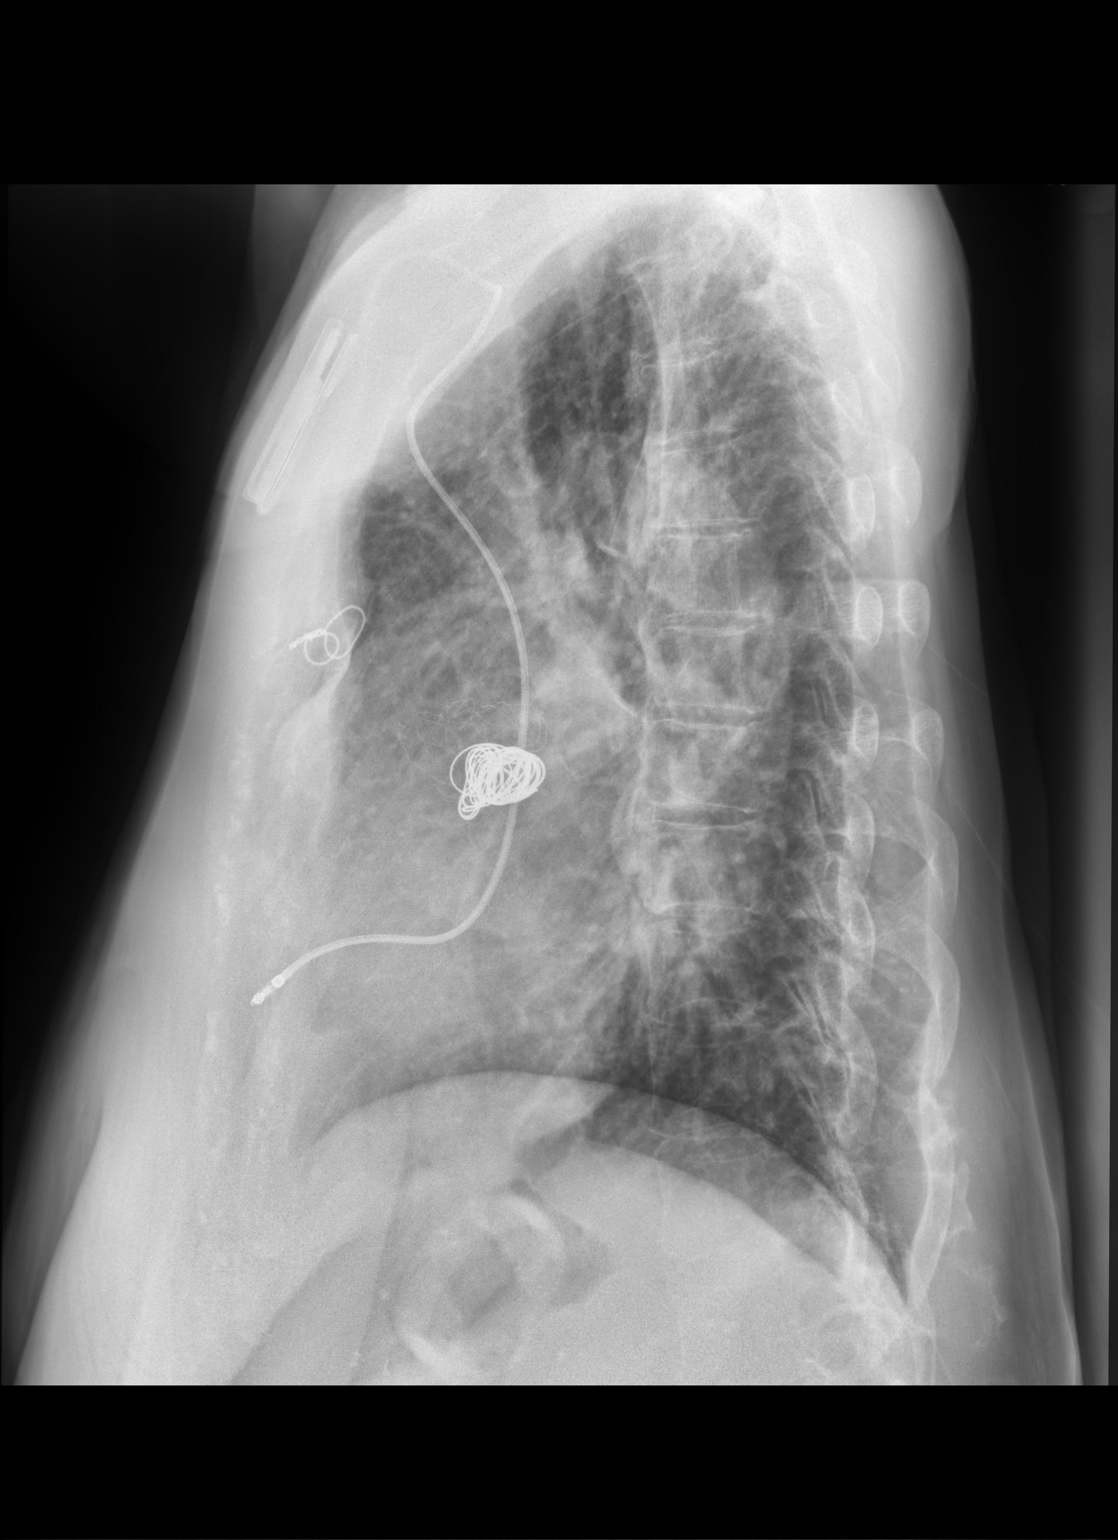

[2 of 2 positions shown; findings below may reference images not displayed]

FINDINGS: Stable moderate cardiomegaly. Single lead transvenous pacemaker
remains in appropriate position. Prior sternotomy and left atrial
appendage closure device again seen. Aortic atherosclerosis.

Chronic pulmonary vascular congestion remains stable. No evidence of
acute infiltrate or edema. No evidence of pleural effusion.
IMPRESSION: Stable cardiomegaly and pulmonary venous hypertension. No active
lung disease.

## 2018-11-14 NOTE — Addendum Note (Signed)
Addended byShawnie Pons on: 11/14/2018 10:09 AM   Modules accepted: Orders

## 2018-11-14 NOTE — Telephone Encounter (Signed)
Pt denied other symptoms beside dry cough. Pt will come for the x-ray at 3 pm today (order entered).

## 2018-11-14 NOTE — Telephone Encounter (Signed)
Screen for COVID If negative, enter CXR order

## 2018-11-14 NOTE — Addendum Note (Signed)
Addended by: Lynnea Ferrier on: 11/14/2018 11:23 AM   Modules accepted: Orders

## 2018-11-14 NOTE — Telephone Encounter (Signed)
Copied from [place] [id number]. Topic: General - Other >> Nov 14, 2018  8:09 AM Celene Kras A wrote: Reason for CRM: Pt states she is still coughing really bad. Pt states the cough syrup prescribed to her is not helping. Pt states the breathing treatment only works sometimes. Pt states she uses it for 10 minutes and states she may need to use it for longer. Please advise.

## 2018-11-17 IMAGING — US US EXTREM LOW VENOUS BILAT
1 series · 13 of 24 positions shown · non-contrast
Comparison: None.

CLINICAL DATA: Bilateral leg pain and tenderness for 3 months.
Previous DVT with IVC filter.



[Series 1: us extrem low venous bilat · 0.05mm/px · 13 of 87 slices shown]
[im 1/87]
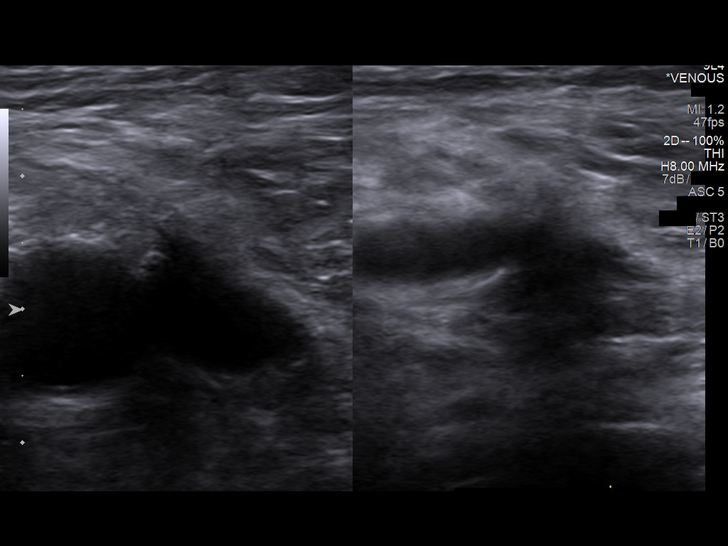
[im 8/87]
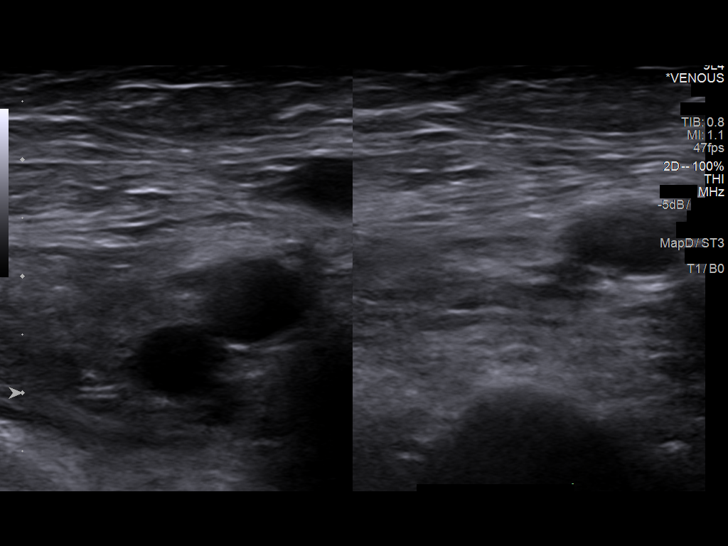
[im 15/87]
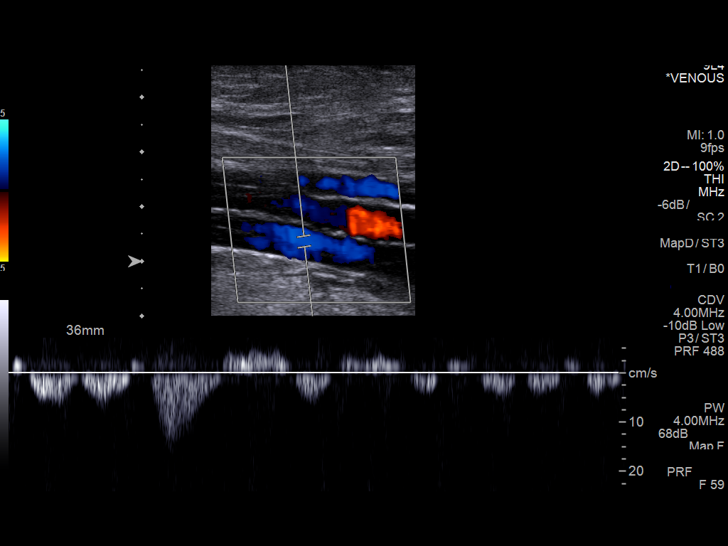
[im 23/87]
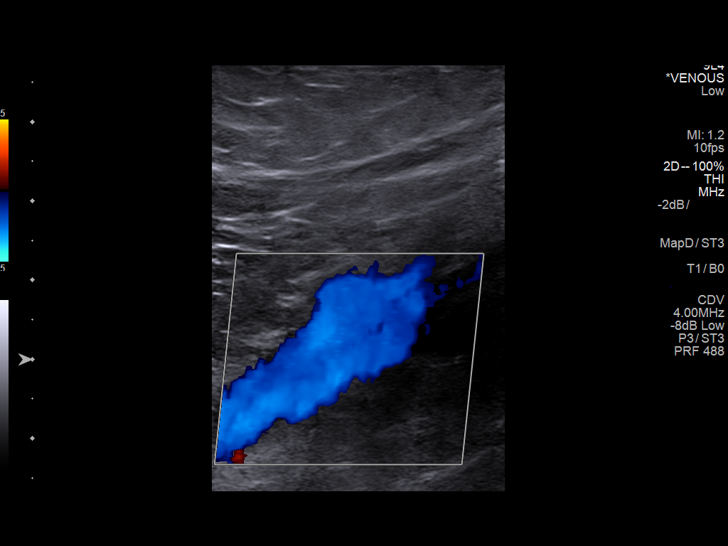
[im 30/87]
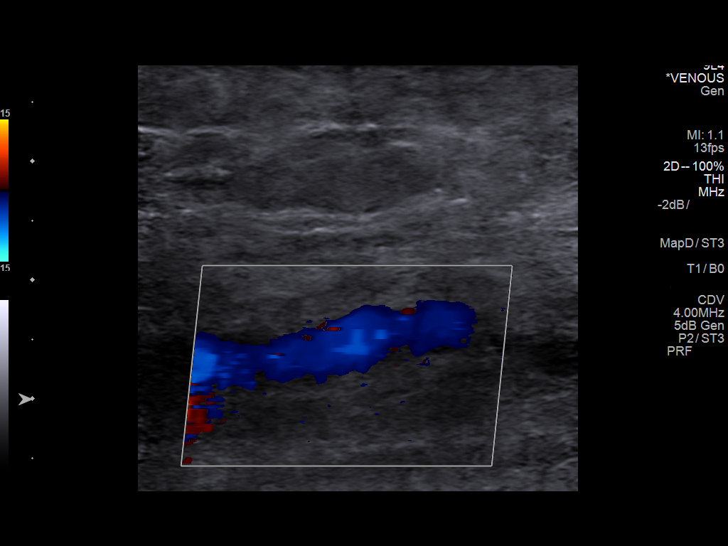
[im 38/87]
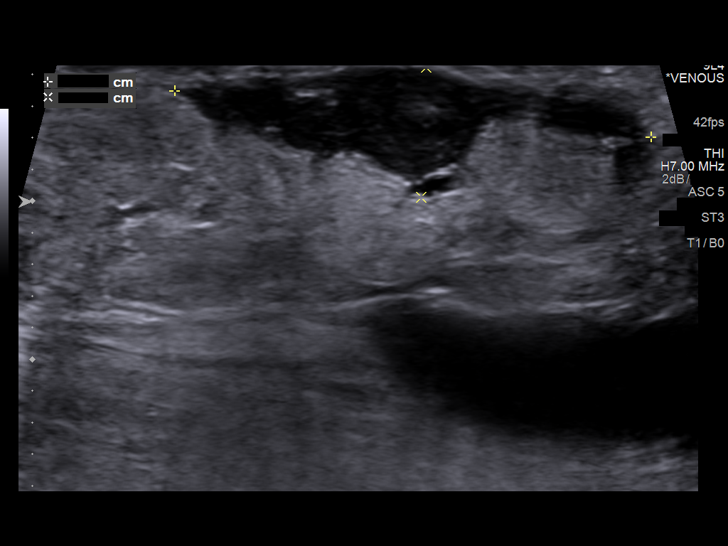
[im 45/87]
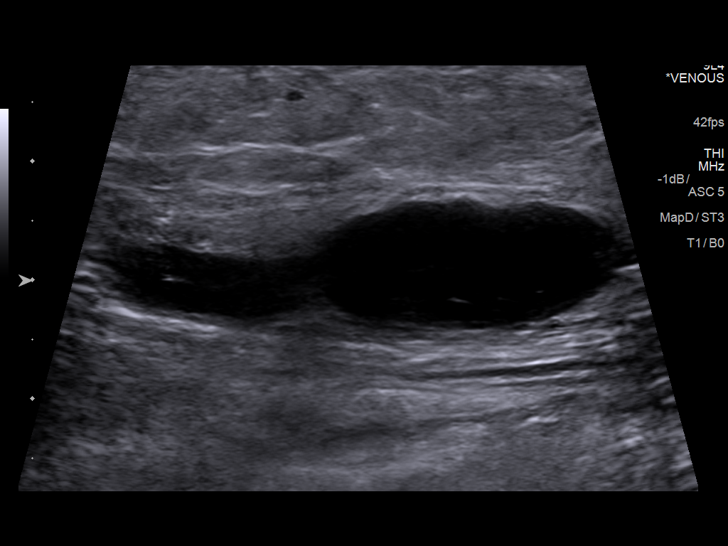
[im 49/87]
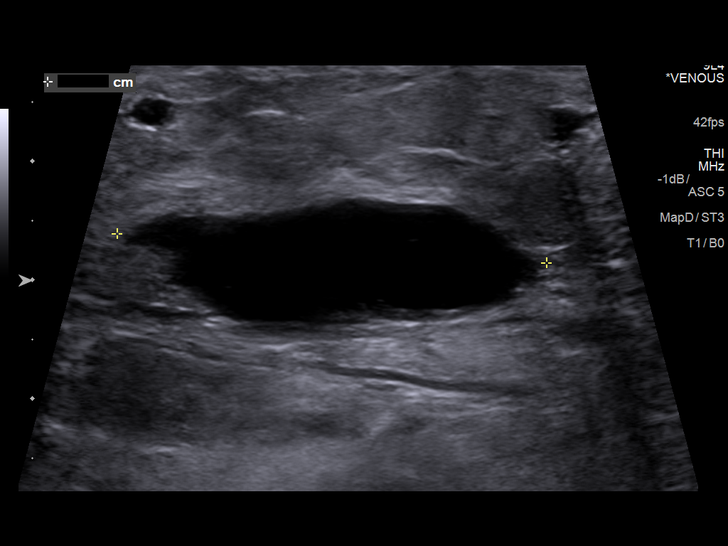
[im 57/87]
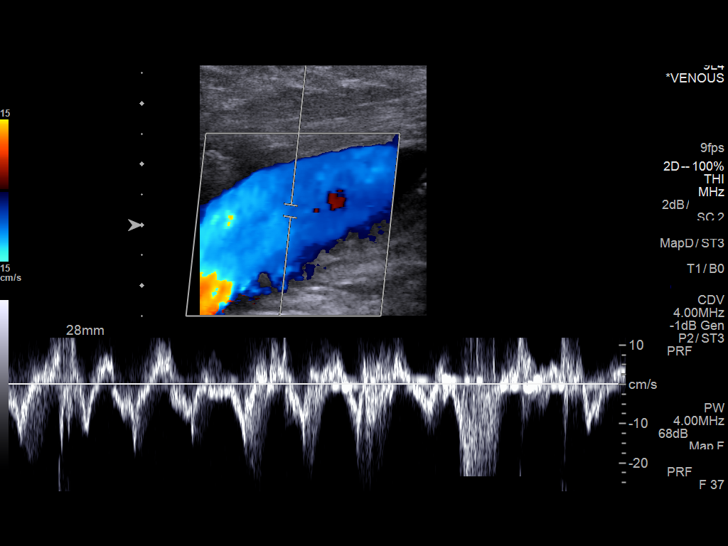
[im 64/87]
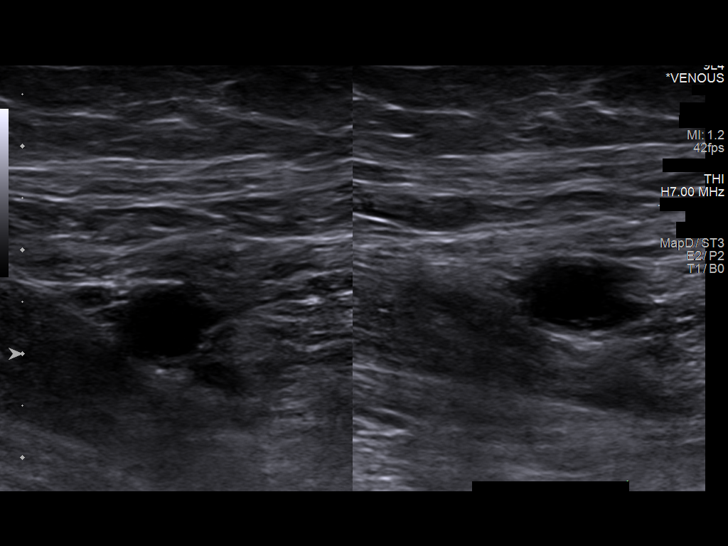
[im 72/87]
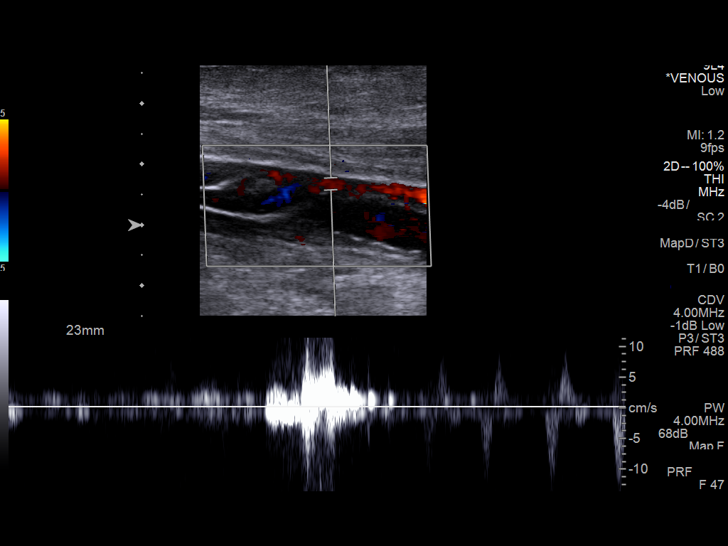
[im 79/87]
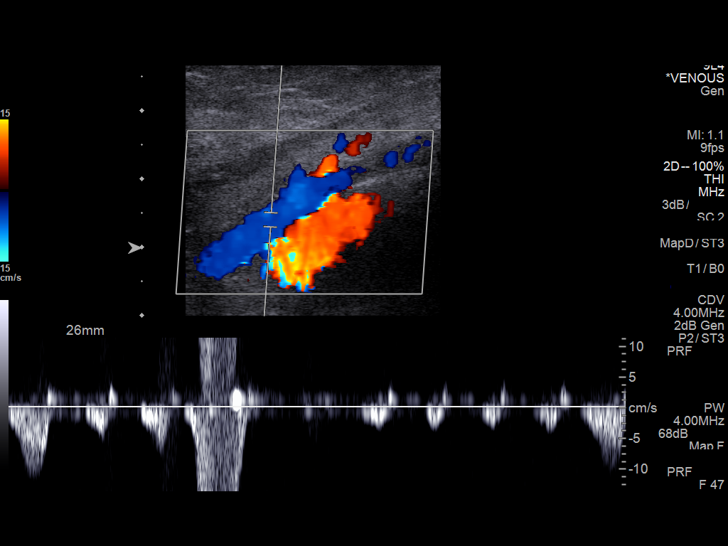
[im 87/87]
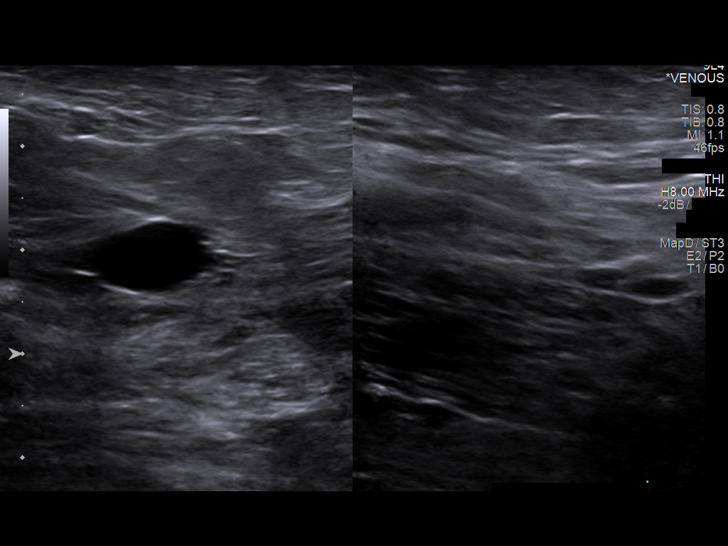

[13 of 24 positions shown; findings below may reference images not displayed]

FINDINGS: RIGHT LOWER EXTREMITY

Common Femoral Vein: No evidence of thrombus. Normal
compressibility, respiratory phasicity and response to augmentation.

Saphenofemoral Junction: No evidence of thrombus. Normal
compressibility and flow on color Doppler imaging.

Profunda Femoral Vein: No evidence of thrombus. Normal
compressibility and flow on color Doppler imaging.

Femoral Vein: No evidence of thrombus. Flow seen throughout on color
Doppler imaging. A thin intraluminal web is seen, which is
consistent with chronic DVT.

Popliteal Vein: No evidence of thrombus. Normal compressibility,
respiratory phasicity and response to augmentation.

Calf Veins: No evidence of thrombus. Normal compressibility and flow
on color Doppler imaging.

Superficial Great Saphenous Vein: No evidence of thrombus. Normal
compressibility.

Venous Reflux:  None.

Other Findings: 2 small complex fluid collections are seen in the
superior right calf which measures 3.0 x 0.7 x 1.7 cm, and 4.4 x
x 3.6 cm.

LEFT LOWER EXTREMITY

Common Femoral Vein: No evidence of thrombus. Flow seen throughout
on color Doppler imaging. A thin intraluminal web is seen,
consistent with chronic DVT.

Saphenofemoral Junction: No evidence of thrombus. Normal
compressibility and flow on color Doppler imaging.

Profunda Femoral Vein: No evidence of thrombus. Normal
compressibility and flow on color Doppler imaging.

Femoral Vein: No evidence of thrombus. Normal compressibility,
respiratory phasicity and response to augmentation.

Popliteal Vein: No evidence of thrombus. Normal compressibility,
respiratory phasicity and response to augmentation.

Calf Veins: No evidence of thrombus. Normal compressibility and flow
on color Doppler imaging.

Superficial Great Saphenous Vein: No evidence of thrombus. Normal
compressibility.

Venous Reflux:  None.

Other Findings:  None.
IMPRESSION: No evidence of acute deep venous thrombosis. Sequelae of chronic DVT
noted in the right femoral vein and left common femoral vein.

Two small complex fluid collections in the superior right calf,
which are of uncertain etiology and clinical significance.

## 2018-11-17 MED ORDER — PREDNISONE 20 MG PO TABS: ORAL_TABLET | ORAL | 0 refills | Status: DC

## 2018-11-21 ENCOUNTER — Ambulatory Visit: Payer: Self-pay | Admitting: Nurse Practitioner

## 2018-11-25 ENCOUNTER — Encounter: Payer: Self-pay | Admitting: Nurse Practitioner

## 2018-11-25 ENCOUNTER — Ambulatory Visit (INDEPENDENT_AMBULATORY_CARE_PROVIDER_SITE_OTHER): Payer: Medicare Other | Admitting: Nurse Practitioner

## 2018-11-25 DIAGNOSIS — F322 Major depressive disorder, single episode, severe without psychotic features: Secondary | ICD-10-CM

## 2018-11-25 MED ORDER — SERTRALINE HCL 100 MG PO TABS: 100.0000 mg | ORAL_TABLET | Freq: Every day | ORAL | 3 refills | Status: DC

## 2018-11-25 NOTE — Progress Notes (Addendum)
Interactive audio and video telecommunications were attempted between this provider and patient, however failed, due to patient having technical difficulties OR patient did not have access to video capability.  We continued and completed visit with audio only.   Virtual Visit via Telephone Note  I connected with [name] on 11/25/18 at  8:30 AM EDT by a telephone enabled telemedicine application and verified that I am speaking with the correct person using two identifiers.  Location: Patient: home Provider: office   I discussed the limitations of evaluation and management by telemedicine and the availability of in person appointments. The patient expressed understanding and agreed to proceed.  CC:3 mo follow up--still has some coughing--depression is okey--FYI--BP cuff is not working at the moment.  pt is c/o of back pain--going on 1 wk---lifting heavy stuff.   History of Present Illness: SUBJECTIVE:  [name] is a 73 y.o. female who complains of an injury causing low back pain 7day(s) ago. The pain is positional with bending or lifting, without radiation down the legs. Mechanism of injury: lifting her mattress. Symptoms have been acute since that time. Prior history of back problems: recurrent self limited episodes of low back pain in the past and previous osteoarthritis of lumbar spine. There is no numbness in the legs.  Cough:improved but persistent, non productive albuterol every 12hrs prn   Depression: improved with zoloft, improved appetite and energy level. Depression screen [facility] 2/9 11/25/2018 07/01/2018 06/23/2018  Decreased Interest 0 1 1  Down, Depressed, Hopeless 0 2 1  PHQ - 2 Score 0 3 2  Altered sleeping 0 1 -  Tired, decreased energy 1 2 -  Change in appetite 1 2 -  Feeling bad or failure about yourself  0 3 -  Trouble concentrating 0 1 -  Moving slowly or fidgety/restless 0 1 -  Suicidal thoughts 0 0 -  PHQ-9 Score 2 13 -  Difficult doing work/chores - - -   Some recent data might be hidden   GAD 7 : Generalized Anxiety Score 11/25/2018 07/01/2018  Nervous, Anxious, on Edge 1 1  Control/stop worrying 0 1  Worry too much - different things 0 1  Trouble relaxing 0 2  Restless 0 1  Easily annoyed or irritable 1 2  Afraid - awful might happen 1 2  Total GAD 7 Score 3 10   Wt Readings from Last 3 Encounters:  11/25/18 156 lb (70.8 kg)  11/05/18 157 lb 12.8 oz (71.6 kg)  09/10/18 165 lb (74.8 kg)   Observations/Objective: Telephone visit, so no physical exam. Alert and orient, clear speech and normal voice tone  Assessment and Plan: [name] was seen today for follow-up and back pain.  Diagnoses and all orders for this visit:  Current severe episode of major depressive disorder without psychotic features without prior episode (HCC) -     sertraline (ZOLOFT) 100 MG tablet; Take 1 tablet (100 mg total) by mouth daily.   Follow Up Instructions: Contact pulmonology to f/up on cough and use of symbicort Maintain current medications  For acute pain, rest, intermittent application of cold packs (later, may switch to heat, but do not sleep on heating pad), analgesics and muscle relaxants are recommended. Discussed longer term treatment plan of prn Tylenol. Proper lifting with avoidance of heavy lifting discussed. Consider Physical Therapy and XRay studies if not improving. Call or return to clinic prn if these symptoms worsen or fail to improve as anticipated.    I discussed the assessment  and treatment plan with the patient. The patient was provided an opportunity to ask questions and all were answered. The patient agreed with the plan and demonstrated an understanding of the instructions.   The patient was advised to call back or seek an in-person evaluation if the symptoms worsen or if the condition fails to improve as anticipated.  I provided 14 minutes of non-face-to-face time during this encounter.  Wilfred Lacy, NP

## 2018-11-26 ENCOUNTER — Telehealth: Payer: Self-pay | Admitting: Pulmonary Disease

## 2018-11-26 NOTE — Telephone Encounter (Signed)
  Pt is calling complaining of non productive cough x 3 weeks. Pt has occasional sob and wheezing she denies chest tightness/pressure. She does have asthma and is not using Symbicort right now.  Saw pcp for acute visit: She says she was given prednisone 20 mg taper 2 weeks ago from pcp which she has completed. She is now on daily 5 mg regimen. Pt is has been taking nebs albuterol every 6 hrs prn.   Pt mis-understood what pcp advised. I informed patient that she probably should not have discontinued her Symbicort. I told her I would send message to provider and we would call back.

## 2018-11-26 NOTE — Telephone Encounter (Signed)
Left message for patient to call back  

## 2018-11-26 NOTE — Telephone Encounter (Signed)
Pt. Needs to resume Symbicort Inhaler 2 puffs twice daily. Schedule  her for an OV in a week so we can ensure she is better. Have her call sooner if she develops a fever, she develops secretions , or worsening shortness of breath. She can add Delsym twice daily ( Over the counter) Thanks

## 2018-11-26 NOTE — Telephone Encounter (Signed)
Spoke with patient. She stated that she had stopped taking the Symbicort but has resumed it. Since resuming the medication, she has been feeling much better. She stated that she wanted to keep her appt for next month. Advised her to call us back if needed.   Nothing further needed at time of call.

## 2018-11-26 NOTE — Telephone Encounter (Signed)
Will close this encounter. See the first encounter from today.

## 2018-11-28 DIAGNOSIS — N184 Chronic kidney disease, stage 4 (severe): Secondary | ICD-10-CM | POA: Diagnosis not present

## 2018-11-28 DIAGNOSIS — D631 Anemia in chronic kidney disease: Secondary | ICD-10-CM | POA: Diagnosis not present

## 2018-11-28 DIAGNOSIS — R319 Hematuria, unspecified: Secondary | ICD-10-CM | POA: Diagnosis not present

## 2018-11-28 DIAGNOSIS — R809 Proteinuria, unspecified: Secondary | ICD-10-CM | POA: Diagnosis not present

## 2018-11-28 DIAGNOSIS — E785 Hyperlipidemia, unspecified: Secondary | ICD-10-CM | POA: Diagnosis not present

## 2018-11-28 DIAGNOSIS — Z940 Kidney transplant status: Secondary | ICD-10-CM | POA: Diagnosis not present

## 2018-11-28 DIAGNOSIS — I770 Arteriovenous fistula, acquired: Secondary | ICD-10-CM | POA: Diagnosis not present

## 2018-11-28 DIAGNOSIS — M109 Gout, unspecified: Secondary | ICD-10-CM | POA: Diagnosis not present

## 2018-11-28 DIAGNOSIS — N2581 Secondary hyperparathyroidism of renal origin: Secondary | ICD-10-CM | POA: Diagnosis not present

## 2018-11-28 DIAGNOSIS — N2589 Other disorders resulting from impaired renal tubular function: Secondary | ICD-10-CM | POA: Diagnosis not present

## 2018-11-28 DIAGNOSIS — I129 Hypertensive chronic kidney disease with stage 1 through stage 4 chronic kidney disease, or unspecified chronic kidney disease: Secondary | ICD-10-CM | POA: Diagnosis not present

## 2018-12-03 ENCOUNTER — Ambulatory Visit: Payer: Medicare Other | Admitting: Psychology

## 2018-12-03 DIAGNOSIS — Z1159 Encounter for screening for other viral diseases: Secondary | ICD-10-CM | POA: Diagnosis not present

## 2018-12-03 DIAGNOSIS — H2513 Age-related nuclear cataract, bilateral: Secondary | ICD-10-CM | POA: Diagnosis not present

## 2018-12-10 DIAGNOSIS — I4819 Other persistent atrial fibrillation: Secondary | ICD-10-CM | POA: Diagnosis not present

## 2018-12-10 DIAGNOSIS — I083 Combined rheumatic disorders of mitral, aortic and tricuspid valves: Secondary | ICD-10-CM | POA: Diagnosis not present

## 2018-12-10 DIAGNOSIS — Z95818 Presence of other cardiac implants and grafts: Secondary | ICD-10-CM | POA: Diagnosis not present

## 2018-12-10 DIAGNOSIS — I700 Atherosclerosis of aorta: Secondary | ICD-10-CM | POA: Diagnosis not present

## 2018-12-10 DIAGNOSIS — T82897D Other specified complication of cardiac prosthetic devices, implants and grafts, subsequent encounter: Secondary | ICD-10-CM | POA: Diagnosis not present

## 2018-12-10 DIAGNOSIS — T82538D Leakage of other cardiac and vascular devices and implants, subsequent encounter: Secondary | ICD-10-CM | POA: Diagnosis not present

## 2018-12-11 ENCOUNTER — Telehealth: Payer: Self-pay | Admitting: Nurse Practitioner

## 2018-12-11 DIAGNOSIS — M545 Low back pain, unspecified: Secondary | ICD-10-CM

## 2018-12-11 NOTE — Telephone Encounter (Signed)
Spoke with the pt, she stated her back pain is not better since last ov. Pt havent lift anything heavy since then,tylenol and heat pack (cant find cold pack) didn't really help. Pt was wondering if [facility] can give her something med to help or what to do next. Please advise.     Copied from [place] [phone]. Topic: General - Other >> Dec 11, 2018  2:41 PM Mcneil, Ja-Kwan wrote: Reason for CRM: Pt stated she needs to speak with Phet. Pt requests that Edison return her call. Cb# [phone] >> Dec 11, 2018  3:36 PM Nicolas Banh, LPN wrote: LVM for the pt to call back.  >> Dec 11, 2018  4:23 PM Celene Kras A wrote: Pt called to return call to Phete. Please advise.

## 2018-12-11 NOTE — Telephone Encounter (Signed)
Go to lab for lumbar x-ray and urinalysis

## 2018-12-12 ENCOUNTER — Other Ambulatory Visit: Payer: Self-pay

## 2018-12-12 ENCOUNTER — Other Ambulatory Visit: Payer: Medicare Other

## 2018-12-12 ENCOUNTER — Ambulatory Visit (INDEPENDENT_AMBULATORY_CARE_PROVIDER_SITE_OTHER): Payer: Medicare Other

## 2018-12-12 DIAGNOSIS — R630 Anorexia: Secondary | ICD-10-CM

## 2018-12-12 DIAGNOSIS — I5023 Acute on chronic systolic (congestive) heart failure: Secondary | ICD-10-CM

## 2018-12-12 DIAGNOSIS — F32A Depression, unspecified: Secondary | ICD-10-CM

## 2018-12-12 DIAGNOSIS — F322 Major depressive disorder, single episode, severe without psychotic features: Secondary | ICD-10-CM

## 2018-12-12 DIAGNOSIS — M545 Low back pain, unspecified: Secondary | ICD-10-CM

## 2018-12-12 DIAGNOSIS — F329 Major depressive disorder, single episode, unspecified: Secondary | ICD-10-CM

## 2018-12-12 DIAGNOSIS — I10 Essential (primary) hypertension: Secondary | ICD-10-CM

## 2018-12-12 IMAGING — DX LUMBAR SPINE - COMPLETE 4+ VIEW
5 series · 5 of 5 positions shown · non-contrast
Comparison: Radiograph [DATE], chest x-ray [DATE],
[DATE]

CLINICAL DATA: Back pain

EXAM:
LUMBAR SPINE - COMPLETE 4+ VIEW

[lumbar spine ap]
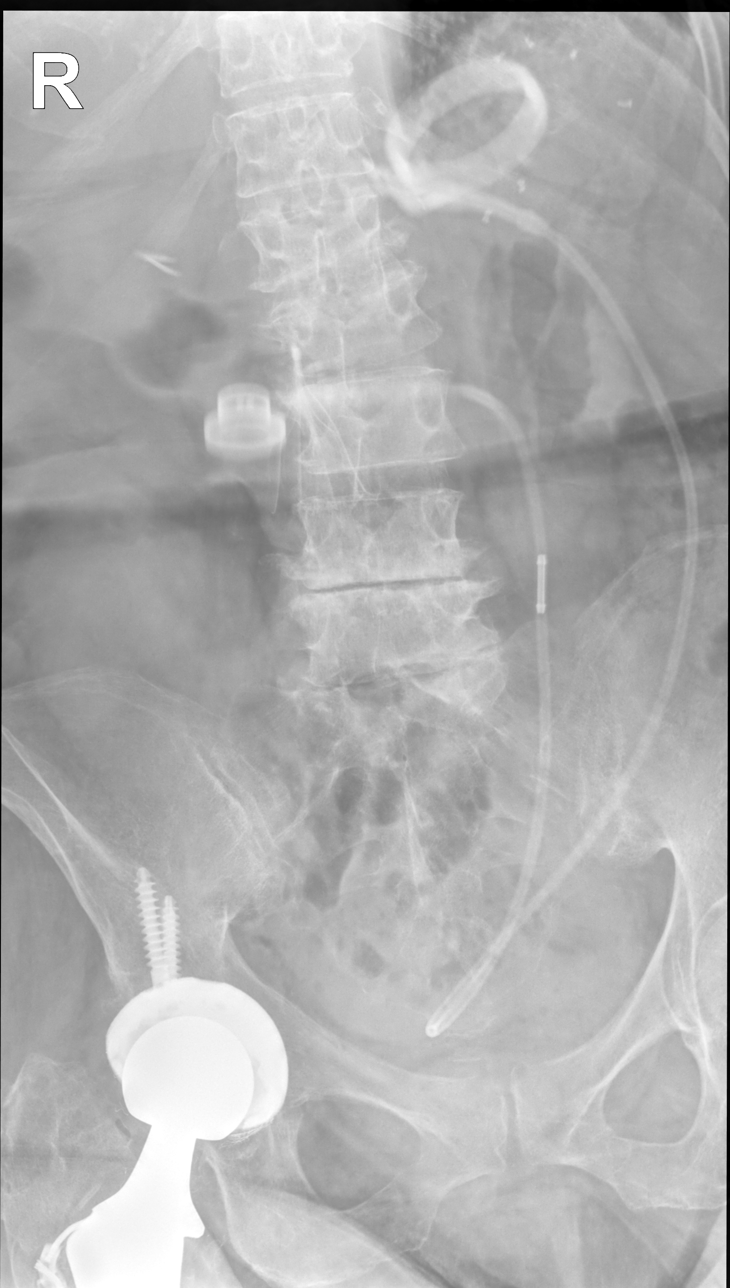

[lumbar spine lmo (1 of 2)]
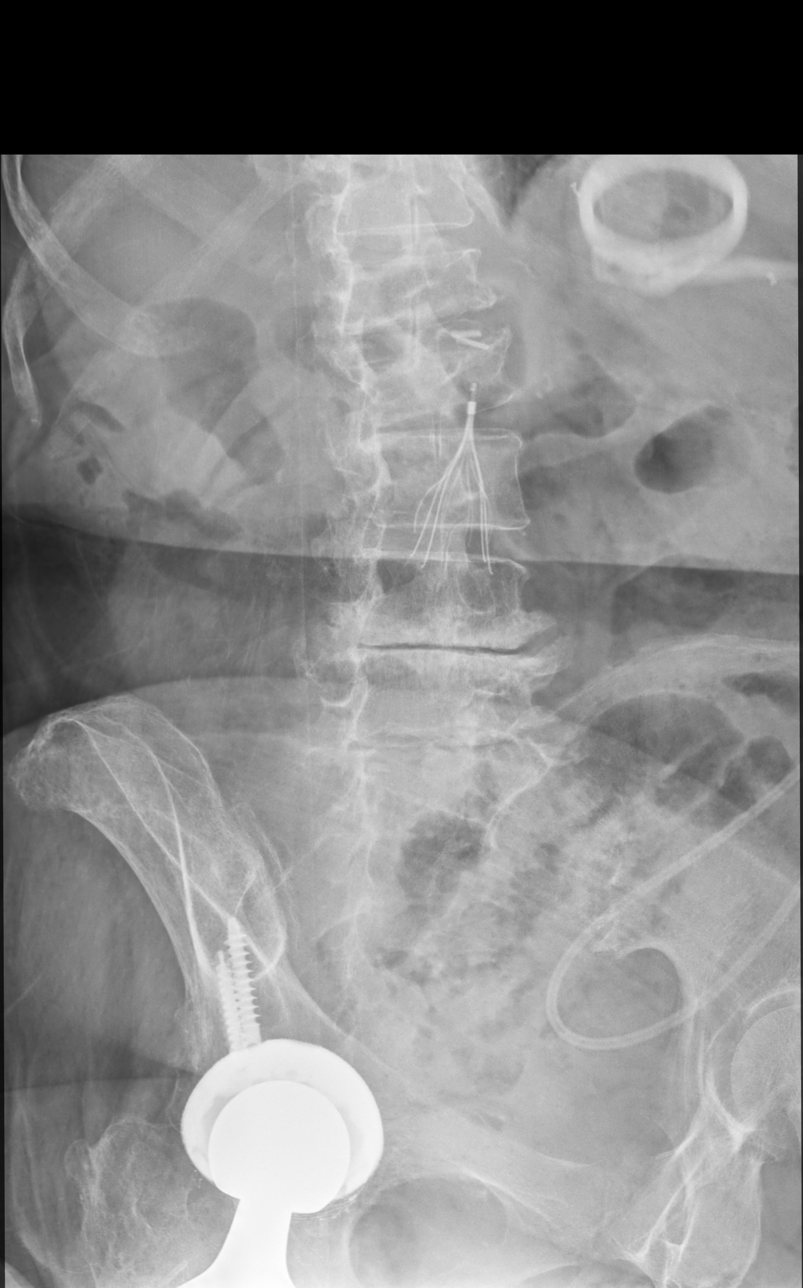

[lumbar spine lmo (2 of 2)]
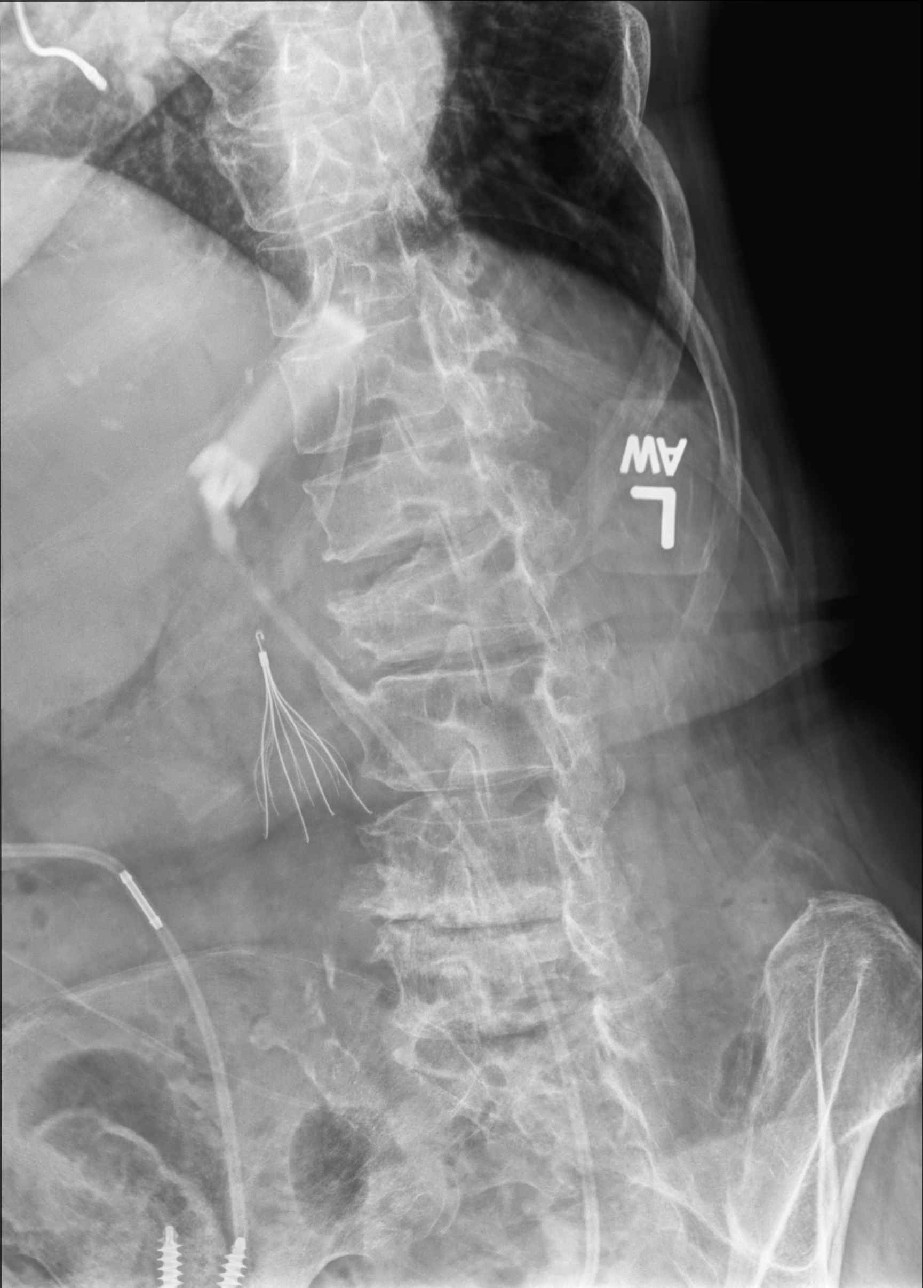

[lumbar spine lat (1 of 2)]
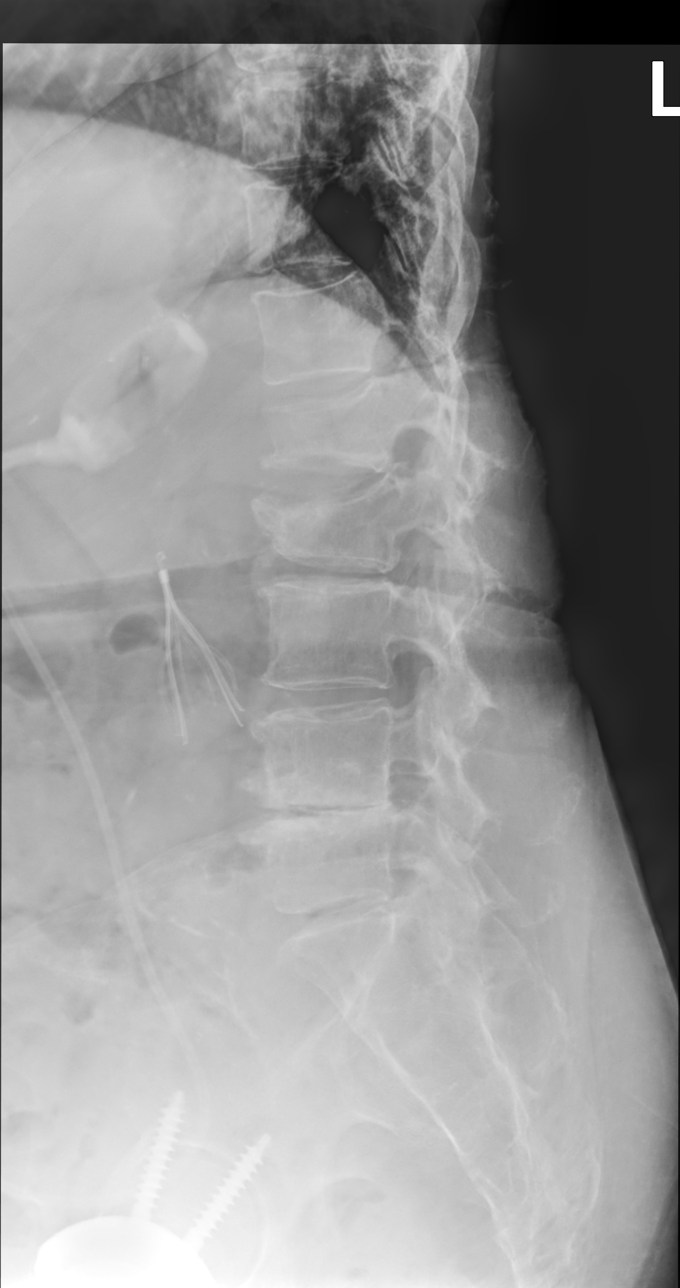

[lumbar spine lat (2 of 2)]
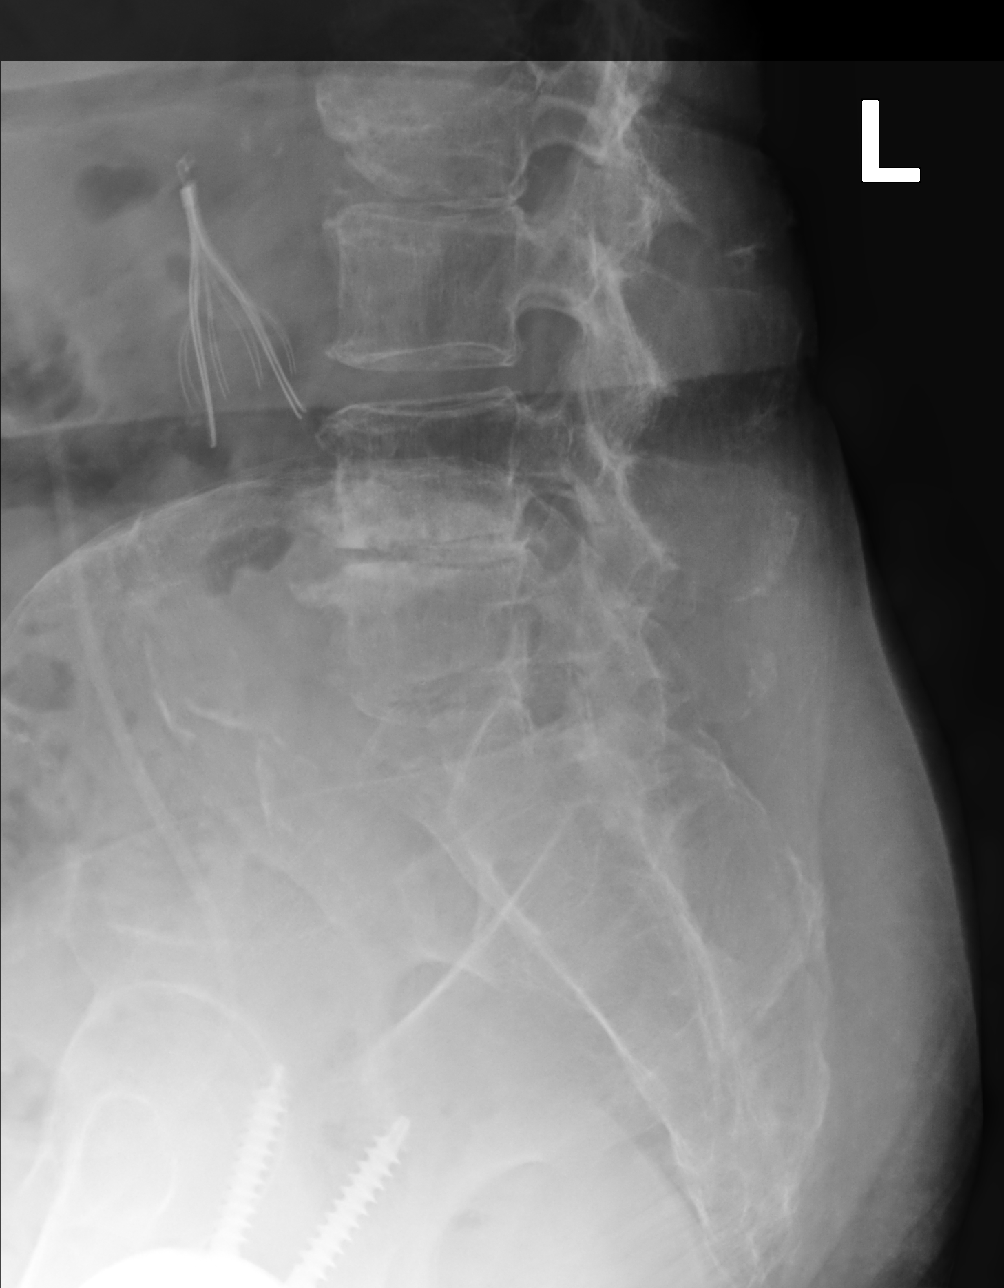

[5 of 5 positions shown; findings below may reference images not displayed]

FINDINGS: Gastric band in the left upper quadrant. Surgical clips in the right
upper quadrant. Previous right hip replacement. Five non rib-bearing
lumbar type vertebra. IVC filter to the right of L2-L3.straightening
of the lumbar spine. Moderate chronic appearing compression
fractures of L1 and L2. Mild degenerative changes at L2-L3 and L3-L4
with moderate to marked degenerative change at L4-L5 and L5-S1.
Posterior facet degenerative changes of the lower spine. Aortic
atherosclerosis
IMPRESSION: 1. Chronic appearing moderate compression fractures at L1 and L2
2. Degenerative changes most marked at L4-L5 and L5-S1

## 2018-12-12 NOTE — Addendum Note (Signed)
Addended by: Lynnea Ferrier on: 12/12/2018 09:08 AM   Modules accepted: Orders

## 2018-12-12 NOTE — Addendum Note (Signed)
Addended by: Lynnea Ferrier on: 12/12/2018 09:07 AM   Modules accepted: Orders

## 2018-12-12 NOTE — Telephone Encounter (Signed)
Pt is aware. Coming in today.

## 2018-12-15 ENCOUNTER — Telehealth: Payer: Self-pay | Admitting: Nurse Practitioner

## 2018-12-15 NOTE — Telephone Encounter (Signed)
done

## 2018-12-15 NOTE — Telephone Encounter (Signed)
Pt would like her x-ray results

## 2018-12-16 DIAGNOSIS — H25811 Combined forms of age-related cataract, right eye: Secondary | ICD-10-CM | POA: Diagnosis not present

## 2018-12-16 DIAGNOSIS — Z01818 Encounter for other preprocedural examination: Secondary | ICD-10-CM | POA: Diagnosis not present

## 2018-12-16 DIAGNOSIS — H25812 Combined forms of age-related cataract, left eye: Secondary | ICD-10-CM | POA: Diagnosis not present

## 2018-12-16 DIAGNOSIS — D485 Neoplasm of uncertain behavior of skin: Secondary | ICD-10-CM | POA: Diagnosis not present

## 2018-12-18 ENCOUNTER — Ambulatory Visit: Payer: Medicare Other | Admitting: Psychology

## 2018-12-24 ENCOUNTER — Ambulatory Visit (INDEPENDENT_AMBULATORY_CARE_PROVIDER_SITE_OTHER): Payer: Medicare Other | Admitting: Psychology

## 2018-12-24 DIAGNOSIS — F329 Major depressive disorder, single episode, unspecified: Secondary | ICD-10-CM | POA: Diagnosis not present

## 2018-12-26 NOTE — Progress Notes (Signed)
@Patient  ID: [name], female    DOB: Jun 29, 1946, 73 y.o.   MRN: [id number]  Chief Complaint  Patient presents with  . Follow-up    SOB with exertion, chest tightness and wheezing    Referring provider: [name], NP  HPI: 73 year old female, never smoked. PMH significant for chronic cough, asthma, amiodarone induced ILD, ESRD s/p kidney transplant in 2017 (myfortic and tacrolimus), chronic proximal A. fib/flutter status post ablation in 2017 and watchman in March 2019, congestive heart failure, history of DVT/PE status post IVC filter.  Patient of Dr. Elsworth Soho, last seen on 06/26/18. Needs follow-up HRCT in May-June 2020. Doubt true asthma. Continues Symbicort 80.  12/29/2018 Patient presents for 6 month follow-up. She has mild ILD and chronic left lower lobe crackles r/t amiodarone toxicity. Breathing has been ok. Reports asthma flare a few weeks ago. Continues using Symbicort twice daily, feels its helps and can tell a difference when she does not use it. Previous dry cough but the past two weeks it hasn't been bad. Still trying to learn to walk better, had physical therapy in the past. Needs to exercise more.  She recognizes she is better when she is more active. She does not have much appetite and has to force herself to eat. Hurt her back 2 weeks ago moving her mattress. She has been told not to lift more than 10 lbs. Trying to do stuff herself.   Significant tests/ events reviewed Spirometry3/2019significant restriction with FVC 25%, FEV1 28%. Ratio 87. Decreased mid flows.  PFTs 11/2017-moderate restriction, FVC 53%, TLC 55%, DLCO 43%, no obstruction  HRCT5/2019-mild patchy subpleural reticulation and groundglass bilateral lungs without significant traction bronchiectasis or frank honeycombing. Findings nonspecific may represent mild postinfectious/inflammatory changes. Possibility of ILD such as NSIP or early UIP cannot be excluded but is not favored  Allergies   Allergen Reactions  . Ace Inhibitors Other (See Comments)    Reaction unknown  . 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 Warfarin Other (See Comments)    Left side brain hemorrhage  . Warfarin Sodium Other (See Comments)    Caused her to have a stroke    Immunization History  Administered Date(s) Administered  . Influenza Split 03/27/2012  . Influenza Whole 04/29/2008  . Influenza, High Dose Seasonal PF 04/19/2017, 03/16/2018  . Influenza,inj,Quad PF,6+ Mos 04/24/2013  . Influenza-Unspecified 04/29/2014, 03/17/2015, 04/18/2016  . Pneumococcal Conjugate-13 03/02/2014  . Pneumococcal Polysaccharide-23 04/25/2005, 09/18/2016  . Tdap 05/08/2013    Past Medical History:  Diagnosis Date  . Acute diastolic (congestive) heart failure ([place]) 08/12/2017  . AKI (acute kidney injury) ([place]) 11/04/2017  . Anemia   . Anxiety   . Arthritis    "shoulders; arms; hips" (02/04/2018)  . Asthma   . Atrial fibrillation ([place])   . Atrial flutter [facility])    s/p aflutter ablation at [facility]  . Benign hypertension with ESRD (end-stage renal disease) ([place])   . Blood transfusion    never had a reaction to blood transfusion  . Cardiomyopathy Mar 2013   Mild, EF 50-55% by Mar 2013 ECHO, diast dysfxn II  . Cardiomyopathy ([place])   . CHF (congestive heart failure) ([place]) 2005  . CHF (congestive heart failure) ([place])   . Childhood asthma   . Closed fracture of right distal radius 10/18/2016  . CMV (cytomegalovirus infection) ([place]) 10/03/2015  . Constipation    takes Miralax daily  . Constipation   .  Depression    takes Zoloft daily  . Distal radius fracture, right   . DVT of lower extremity, bilateral ([place]) 12/21/11   "they're there now; been there for 2 wks"  . Eczema   . ESRD (end stage renal disease) on dialysis [facility]) 09/2011   07-19-2015 had Kidney transplant at [facility]; "don't get dialysis anymore" (02/04/2018)  . Fractures, stress    in both  feet--6 OR 7 YRS AGO--RESOLVED  . Generalized edema [id number]  . Gout    doesn't require meds   . HCAP (healthcare-associated pneumonia) 09/10/2011  . Hearing difficulty   . High cholesterol   . History of CVA (cerebrovascular accident) without residual deficits   . History of hip replacement, total, right   . History of pneumonia   . Hx of Clostridium difficile infection   . Hypercalcemia    09/10/11  . Hyperparathyroidism ([place]) 04/07/2013  . Hypertension    takes Diltiazem daily   . Hypomagnesemia 08/02/2015  . Hypophosphatemia 11/08/2017  . ICB (intracranial bleed) ([place])   . Immunosuppression ([place]) 07/25/2015  . Memory changes   . Morbid obesity ([place])   . Nonischemic dilated cardiomyopathy ([place])   . Obesity 04/07/2013  . Oligouria   . OSA on CPAP   . PAF (paroxysmal atrial fibrillation) (HCC)     HX OF CEREBRAL BLEED WHILE ON COUMADIN-SO PT NOT ON ANY BLOOD THINNERS NOW  . Peripheral vascular disease ([place])   . Presence of Watchman left atrial appendage closure device 10/04/2017  . Proteinuria 09/04/2016  . Psoriasis 08/07/2016  . 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 S/P insertion of IVC (inferior vena caval) filter   . Sepsis ([place]) 11/08/2017  . Stress fracture    bilateral feet  . Stroke [facility]) 2009   denies residual on 02/04/2018;  hemorrhagic now off coumadin  . Stroke due to intracerebral hemorrhage ([place]) 2009  . Suture reaction 09/04/2016  . Transfusion history   . Use of cane as ambulatory aid     Tobacco History: Social History   Tobacco Use  Smoking Status Never Smoker  Smokeless Tobacco Never Used   Counseling given: Not Answered   Outpatient Medications Prior to Visit  Medication Sig Dispense Refill  . acetaminophen (TYLENOL) 500 MG tablet Take 500 mg by mouth every 6 (six) hours as needed.    Marland Kitchen albuterol (PROVENTIL) (2.5 MG/3ML) 0.083% nebulizer solution Take 3 mLs (2.5 mg total) by nebulization every 6 (six) hours as needed for wheezing or  shortness of breath. 150 mL 0  . aspirin EC 81 MG tablet Take 81 mg by mouth.    . budesonide-formoterol (SYMBICORT) 80-4.5 MCG/ACT inhaler Inhale 2 puffs into the lungs 2 (two) times daily. 1 Inhaler 6  . calcitRIOL (ROCALTROL) 0.25 MCG capsule Take by mouth daily.    . clopidogrel (PLAVIX) 75 MG tablet Take 75 mg by mouth daily.    Marland Kitchen docusate sodium (COLACE) 100 MG capsule Take 100 mg by mouth daily as needed (For constipation.).     Marland Kitchen Ferrous Sulfate (IRON PO) Take by mouth.    . furosemide (LASIX) 80 MG tablet Take 80 mg by mouth 2 (two) times daily.    . Magnesium 400 MG TABS Take by mouth 2 (two) times daily.    Earley Abide Compounding Base (MOUTH WASH-GP PO) Take by mouth. MUCOSITIS MOUTH WASH-Give 71mls by mouth 4 times daily for 10 days for oral mucositis    . mycophenolate (MYFORTIC) 180 MG EC tablet Take 180 mg by  mouth 2 (two) times daily.    . potassium chloride SA (K-DUR,KLOR-CON) 20 MEQ tablet Take 20 mEq by mouth.     . predniSONE (DELTASONE) 5 MG tablet Take 5 mg by mouth daily with breakfast.    . sertraline (ZOLOFT) 100 MG tablet Take 1 tablet (100 mg total) by mouth daily. 90 tablet 3  . SODIUM BICARBONATE PO Take by mouth. 10 grain--taking 2 tab PO BID    . tacrolimus (PROGRAF) 1 MG capsule Take 2 mg by mouth daily. 2 mg po q am and 1 mg po q HS    . SYMBICORT 160-4.5 MCG/ACT inhaler TAKE 2 PUFFS BY MOUTH TWICE A DAY (Patient not taking: Reported on 12/29/2018) 30.6 Inhaler 1   No facility-administered medications prior to visit.    Review of Systems  Review of Systems  Constitutional: Positive for appetite change.  Respiratory: Negative for cough, shortness of breath and wheezing.   Cardiovascular: Negative.   Musculoskeletal: Positive for back pain.   Physical Exam  BP (!) 144/82 (BP Location: Right Arm, Cuff Size: Normal)   Pulse 83   Temp 98 F (36.7 C)   Ht 5\' 5"  (1.651 m)   Wt 151 lb (68.5 kg)   SpO2 97%   BMI 25.13 kg/m  Physical Exam Constitutional:       Appearance: Normal appearance.  HENT:     Head: Normocephalic and atraumatic.     Right Ear: Tympanic membrane normal.     Left Ear: Tympanic membrane normal.     Mouth/Throat:     Mouth: Mucous membranes are moist.     Pharynx: Oropharynx is clear.  Cardiovascular:     Rate and Rhythm: Normal rate and regular rhythm.     Comments: Trace right lower extremity edema  Pulmonary:     Effort: Pulmonary effort is normal. No respiratory distress.     Breath sounds: No wheezing, rhonchi or rales.  Neurological:     General: No focal deficit present.     Mental Status: She is alert and oriented to person, place, and time. Mental status is at baseline.  Psychiatric:        Mood and Affect: Mood normal.        Behavior: Behavior normal.        Thought Content: Thought content normal.        Judgment: Judgment normal.      Lab Results:  CBC    Component Value Date/Time   WBC 11.1 08/25/2018   WBC 11.1 08/25/2018   WBC 12.7 (H) 02/05/2018 0002   RBC 4.76 02/05/2018 0002   HGB 11.8 (A) 08/25/2018   HGB 11.8 (A) 08/25/2018   HCT 36 08/25/2018   HCT 36 08/25/2018   PLT 282 08/25/2018   PLT 282 08/25/2018   MCV 83.8 02/05/2018 0002   MCH 26.1 02/05/2018 0002   MCHC 31.1 02/05/2018 0002   RDW 20.6 (H) 02/05/2018 0002   LYMPHSABS 2.4 01/22/2012 1202   MONOABS 1.4 (H) 01/22/2012 1202   EOSABS 0.1 01/22/2012 1202   BASOSABS 0.1 01/22/2012 1202    BMET    Component Value Date/Time   NA 140 08/25/2018   NA 140 08/25/2018   K 3.6 08/25/2018   K 3.6 08/25/2018   CL 99 02/05/2018 0002   CO2 24 02/05/2018 0002   GLUCOSE 89 02/05/2018 0002   BUN 31 (A) 08/25/2018   BUN 31 (A) 08/25/2018   CREATININE 2.1 (A) 08/25/2018   CREATININE 2.1 (A)  08/25/2018   CREATININE 5.27 (H) 02/05/2018 0002   CALCIUM 9.1 02/05/2018 0002   CALCIUM 11.1 (H) 09/11/2011 0825   GFRNONAA 8 (L) 02/05/2018 0002   GFRAA 9 (L) 02/05/2018 0002    BNP    Component Value Date/Time   BNP 525.4 (H)  11/03/2017 1805    ProBNP No results found for: PROBNP  Imaging: Dg Lumbar Spine Complete  Result Date: 12/12/2018 CLINICAL DATA:  Back pain EXAM: LUMBAR SPINE - COMPLETE 4+ VIEW COMPARISON:  Radiograph 03/25/2012, chest x-ray 07/21/2018, 07/01/2018 FINDINGS: Gastric band in the left upper quadrant. Surgical clips in the right upper quadrant. Previous right hip replacement. Five non rib-bearing lumbar type vertebra. IVC filter to the right of L2-L3.straightening of the lumbar spine. Moderate chronic appearing compression fractures of L1 and L2. Mild degenerative changes at L2-L3 and L3-L4 with moderate to marked degenerative change at L4-L5 and L5-S1. Posterior facet degenerative changes of the lower spine. Aortic atherosclerosis IMPRESSION: 1. Chronic appearing moderate compression fractures at L1 and L2 2. Degenerative changes most marked at L4-L5 and L5-S1 Electronically Signed   By: Donavan Foil M.D.   On: 12/12/2018 20:06     Assessment & Plan:   Asthma - Stable - Continue Symbicort 80 2 bid  History of amiodarone therapy - Mild ILD r/t amiodarone toxicity - Spirometry3/2019significant restriction with FVC 25%, FEV1 28%. Ratio 87. Decreased mid flows - Needs repeat HRCT scheduled for June 2020 - Encouraged patient to stay as active as possible   FU in 4-6 months with Dr. Candise Che, NP 12/29/2018

## 2018-12-29 ENCOUNTER — Telehealth: Payer: Self-pay | Admitting: Primary Care

## 2018-12-29 ENCOUNTER — Ambulatory Visit (INDEPENDENT_AMBULATORY_CARE_PROVIDER_SITE_OTHER): Payer: Medicare Other | Admitting: Primary Care

## 2018-12-29 ENCOUNTER — Other Ambulatory Visit: Payer: Self-pay

## 2018-12-29 ENCOUNTER — Encounter: Payer: Self-pay | Admitting: Primary Care

## 2018-12-29 DIAGNOSIS — Z9229 Personal history of other drug therapy: Secondary | ICD-10-CM | POA: Diagnosis not present

## 2018-12-29 MED ORDER — BUDESONIDE-FORMOTEROL FUMARATE 80-4.5 MCG/ACT IN AERO: 2.0000 | INHALATION_SPRAY | Freq: Two times a day (BID) | RESPIRATORY_TRACT | 2 refills | Status: DC

## 2018-12-29 NOTE — Telephone Encounter (Signed)
Per Dr. Bari Mantis last note he decreased her Symbicort to 80. I would recommend she try the lower dose, the least amount of steroid the better.

## 2018-12-29 NOTE — Telephone Encounter (Signed)
Spoke with patient. She has agreed to try the Symbicort 80. Will keep the samples that she has.   Nothing further needed at time of call.

## 2018-12-29 NOTE — Patient Instructions (Addendum)
Pleasure meeting you both today  Due for HRCT in June (already ordered)  Call insure and ask what formulary inhaler is on your plan to replace symbicort (ICS/LABA)- ___________________________  Stay active (walking, biking, swimming, 3-5 lb weights for upper body)  Follow up with PCP regarding back pain if does not get better   Return in 4-6 months with Dr. Elsworth Soho or sooner if needed  Core Strength Exercises  Core exercises help to build strength in the muscles between your ribs and your hips (abdominal muscles). These muscles help to support your body and keep your spine stable. It is important to maintain strength in your core to prevent injury and pain. Some activities, such as yoga and Pilates, can help to strengthen core muscles. You can also strengthen core muscles with exercises at home. It is important to talk to your health care provider before you start a new exercise routine. What are the benefits of core strength exercises? Core strength exercises can:  Reduce back pain.  Help to rebuild strength after a back or spine injury.  Help to prevent injury during physical activity, especially injuries to the back and knees. How to do core strength exercises Repeat these exercises 10-15 times, or until you are tired. Do exercises exactly as told by your health care provider and adjust them as directed. It is normal to feel mild stretching, pulling, tightness, or discomfort as you do these exercises. If you feel any pain while doing these exercises, stop. If your pain continues or gets worse when doing core exercises, contact your health care provider. You may want to use a padded yoga or exercise mat for strength exercises that are done on the floor. Bridging  1. Lie on your back on a firm surface with your knees bent and your feet flat on the floor. 2. Raise your hips so that your knees, hips, and shoulders form a straight line together. Keep your abdominal muscles tight. 3. Hold  this position for 3-5 seconds. 4. Slowly lower your hips to the starting position. 5. Let your muscles relax completely between repetitions. Single-leg bridge 1. Lie on your back on a firm surface with your knees bent and your feet flat on the floor. 2. Raise your hips so that your knees, hips, and shoulders form a straight line together. Keep your abdominal muscles tight. 3. Lift one foot off the floor, then completely straighten that leg. 4. Hold this position for 3-5 seconds. 5. Put the straight leg back down in the bent position. 6. Slowly lower your hips to the starting position. 7. Repeat these steps using your other leg. Side bridge 1. Lie on your side with your knees bent. Prop yourself up on the elbow that is near the floor. 2. Using your abdominal muscles and your elbow that is on the floor, raise your body off the floor. Raise your hip so that your shoulder, hip, and foot form a straight line together. 3. Hold this position for 10 seconds. Keep your head and neck raised and away from your shoulder (in their normal, neutral position). Keep your abdominal muscles tight. 4. Slowly lower your hip to the starting position. 5. Repeat and try to hold this position longer, working your way up to 30 seconds. Abdominal crunch 1. Lie on your back on a firm surface. Bend your knees and keep your feet flat on the floor. 2. Cross your arms over your chest. 3. Without bending your neck, tip your chin slightly toward your chest. 4.  Tighten your abdominal muscles as you lift your chest just high enough to lift your shoulder blades off of the floor. Do not hold your breath. You can do this with short lifts or long lifts. 5. Slowly return to the starting position. Bird dog 1. Get on your hands and knees, with your legs shoulder-width apart and your arms under your shoulders. Keep your back straight. 2. Tighten your abdominal muscles. 3. Raise one of your legs off the floor and straighten it. Try to  keep it parallel to the floor. 4. Slowly lower your leg to the starting position. 5. Raise one of your arms off the floor and straighten it. Try to keep it parallel to the floor. 6. Slowly lower your arm to the starting position. 7. Repeat with the other arm and leg. If possible, try raising a leg and arm at the same time, on opposite sides of the body. For example, raise your left hand and your right leg. Plank 1. Lie on your belly. 2. Prop up your body onto your forearms and your feet, keeping your legs straight. Your body should make a straight line between your shoulders and feet. 3. Hold this position for 10 seconds while keeping your abdominal muscles tight. 4. Lower your body to the starting position. 5. Repeat and try to hold this position longer, working your way up to 30 seconds. Cross-core strengthening 1. Stand with your feet shoulder-width apart. 2. Hold a ball out in front of you. Keep your arms straight. 3. Tighten your abdominal muscles and slowly rotate at your waist from side to side. Keep your feet flat. 4. Once you are comfortable, try repeating this exercise with a heavier ball. Top core strengthening 1. Stand about 18 inches (46 cm) in front of a wall, with your back to the wall. 2. Keep your feet flat and shoulder-width apart. 3. Tighten your abdominal muscles. 4. Bend your hips and knees. 5. Slowly reach between your legs to touch the wall behind you. 6. Slowly stand back up. 7. Raise your arms over your head and reach behind you. 8. Return to the starting position. General tips  Do not do any exercises that cause pain. If you have pain while exercising, talk to your health care provider.  Always stretch before and after doing these exercises. This can help prevent injury.  Maintain a healthy weight. Ask your health care provider what weight is healthy for you. Contact a health care provider if:  You have back pain that gets worse or does not go away.  You  feel pain while doing core strength exercises. Get help right away if:  You have severe pain that does not get better with medicine. Summary  Core exercises help to build strength in the muscles between your ribs and your waist.  Core muscles help to support your body and keep your spine stable.  Some activities, such as yoga and Pilates, can help to strengthen core muscles.  Core strength exercises can help back pain and can prevent injury.  If you feel any pain while doing core strength exercises, stop. This information is not intended to replace advice given to you by your health care provider. Make sure you discuss any questions you have with your health care provider. Document Released: 11/21/2016 Document Revised: 11/21/2016 Document Reviewed: 11/21/2016 Elsevier Interactive Patient Education  2019 [place].   Asthma and Physical Activity Physical activity is an important part of a healthy lifestyle. If you have asthma, it is  important to exercise because physical activity can help you to:  Control your asthma.  Maintain your weight or lose weight.  Increase your energy.  Decrease stress and anxiety.  Lower your risk of getting sick.  Improve your heart health. However, asthma symptoms can flare up when you are physically active or exercising. You can learn how to control your asthma and prevent symptoms during exercise. This will help you to remain physically active. How can asthma affect my ability to be physically active? When you have asthma, physical activity can cause you to have symptoms such as:  Wheezing. This may sound like whistling while breathing.  A feeling of tightness in the chest.  Sore throat.  Coughing.  Shortness of breath.  Tiredness (fatigue) with minimal activity.  Increased sputum production.  Chest pain. What actions can I take to prevent asthma problems during physical activity? Pulmonary rehabilitation Enroll in a pulmonary  rehabilitation program. Benefits of this type of program include:  Education on lung diseases.  Classes that teach you how to exercise and be more active while decreasing your shortness of breath.  A group setting that allows you to talk with others who have asthma. Asthma action plan Follow the asthma action plan set by your health care provider. Your personal asthma plan may include:  Taking your medicines as told by your health care provider.  Avoiding your asthma triggers, except physical activity. Triggers may include cold air, dust, pollen, pet dander, and air pollution.  Tracking your asthma control.  Using a peak flow meter.  Being aware of worsening symptoms.  Knowing when to seek emergency care. Proper breathing During exercise, follow these tips for proper breathing:  Breathe in before starting the exercise and breathe out during the hardest part of the exercise.  Take slow breaths.  Pace yourself and do not try to go too fast.  While breathing out, purse your lips. Before beginning any exercise program or new activity, talk with your health care provider. Medicines If physical activity triggers your asthma, your health care provider may order the following medicines:  A rescue inhaler (short-acting beta2-agonist) for you to use shortly before physical activity or exercise. Its effects may reduce exercise-related symptoms for 2-3 hours.  A long-acting beta2-agonist that can offer up to 12 hours of relief if taken daily.  Leukotriene modifiers. These pills are taken several hours before physical activity or exercise to help prevent asthma symptoms that are caused by exercise.  Long-term control medicines. These will be given if you have severe or frequent asthma symptoms during or after exercise. These symptoms may also mean that your asthma is not well controlled.  General information  Exercise indoors when the air is dry or during allergy season.  Try to  breathe in warm, moist air by wearing a scarf over your nose and mouth or breathing only through your nose.  Spend a few minutes warming up before your workout.  Cool down after exercise. What should I do if my asthma symptoms get worse? Contact your health care provider if your asthma symptoms are getting worse. Your asthma is getting worse if:  You have symptoms more often.  Your symptoms are more severe.  Your symptoms get worse at night and make you lose sleep.  Your peak flow number is lower than your personal best or changes a lot from day to day.  Your asthma medicines do not work as well as they used to.  You use your rescue inhaler more  often. If you use your rescue inhaler more than 2 days a week, your asthma is not well controlled.  You go to the emergency room or see your health care provider because of an asthma attack. Where to find support  Ask your health care provider about signing up for a pulmonary rehabilitation program.  Ask your health care provider about asthma support groups.  Visit your local community health department.  Check out local hospitals' community health programs. Where can I get more information?  Your health care provider.  American Lung Association: lung.org  National Heart, Lung, and Blood Institute: https://www.[place].biz/ Contact a health care provider if:  You have trouble walking and talking because you are out of breath. Get help right away if:  Your lips or fingernails are blue.  You are not able to breathe or catch your breath. Summary  Physical activity is an important part of a healthy lifestyle. However, if you have asthma, your symptoms can flare up during exercise or physical activity.  You can prevent problems during physical activity by doing pulmonary rehabilitation, following an asthma action plan, doing proper breathing, and using medicines.  Talk with your health care provider before starting any exercise program or  new activity. This information is not intended to replace advice given to you by your health care provider. Make sure you discuss any questions you have with your health care provider. Document Released: 09/17/2017 Document Revised: 09/17/2017 Document Reviewed: 09/17/2017 Elsevier Interactive Patient Education  2019 [employer].

## 2018-12-29 NOTE — Assessment & Plan Note (Signed)
-   Stable - Continue Symbicort 80 2 bid

## 2018-12-29 NOTE — Telephone Encounter (Signed)
Spoke with patient. She stated that once she got home she realized she needs the Symbicort 160 instead of the Symbicort 80. She wants to know if she could come back and switch out the samples since she has not opened them.   Reviewed patient's chart, it is confusing because it looks like she started the Symbicort 160 back in February. All other encounters state Symbicort 80.   Beth, please advise as she is asking for Symbicort 160. Thanks!

## 2018-12-29 NOTE — Assessment & Plan Note (Addendum)
-   Mild ILD r/t amiodarone toxicity - Spirometry3/2019significant restriction with FVC 25%, FEV1 28%. Ratio 87. Decreased mid flows - Needs repeat HRCT scheduled for June 2020 - Encouraged patient to stay as active as possible

## 2018-12-31 IMAGING — US US RENAL TRANSPLANT
1 series · 13 of 25 positions shown · non-contrast
Comparison: None.

CLINICAL DATA: Acute renal failure.

EXAM:
ULTRASOUND OF RENAL TRANSPLANT WITH RENAL DOPPLER ULTRASOUND
TECHNIQUE: Ultrasound examination of the renal transplant was performed with
gray-scale, color and duplex doppler evaluation. Technologist
describes technically difficult study with limited vascular
evaluation attributed to body habitus and overlying bowel gas.

[Series 1: us renal transplant · 0.22mm/px · 28 acquisitions, 13 frames shown]
[im 1/28]
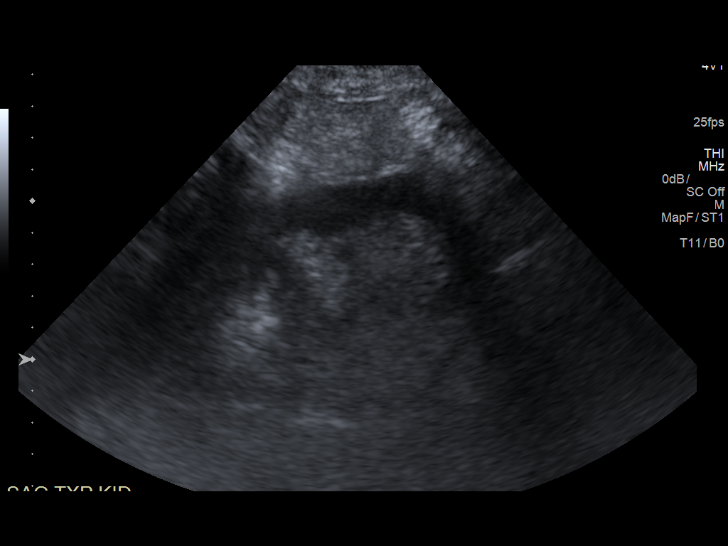
[im 3/28]
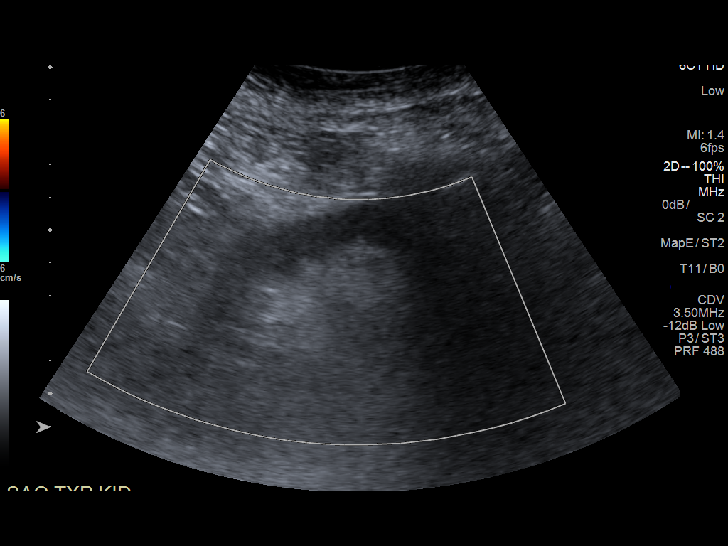
[im 5/28]
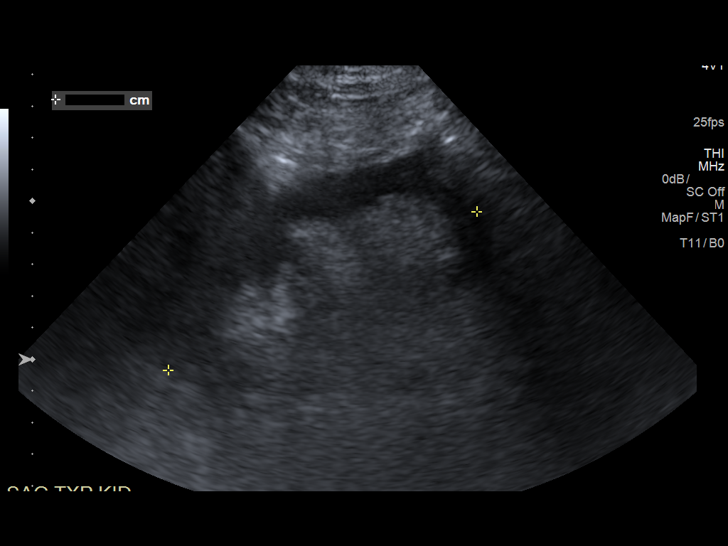
[im 7/28]
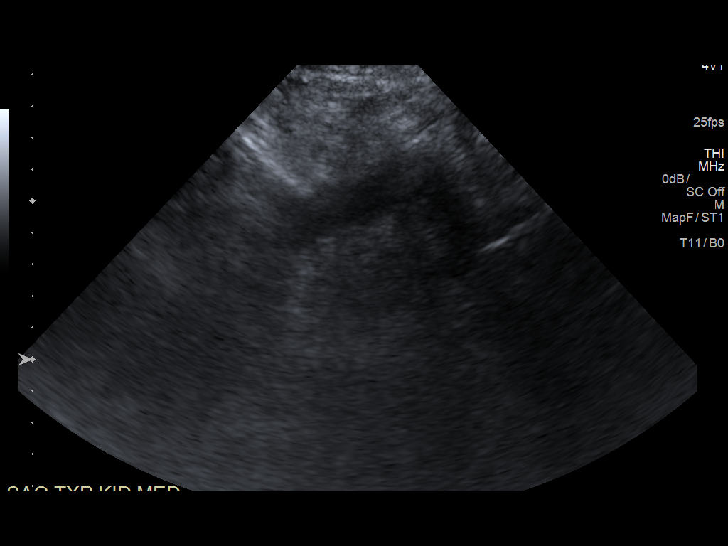
[im 10/28]
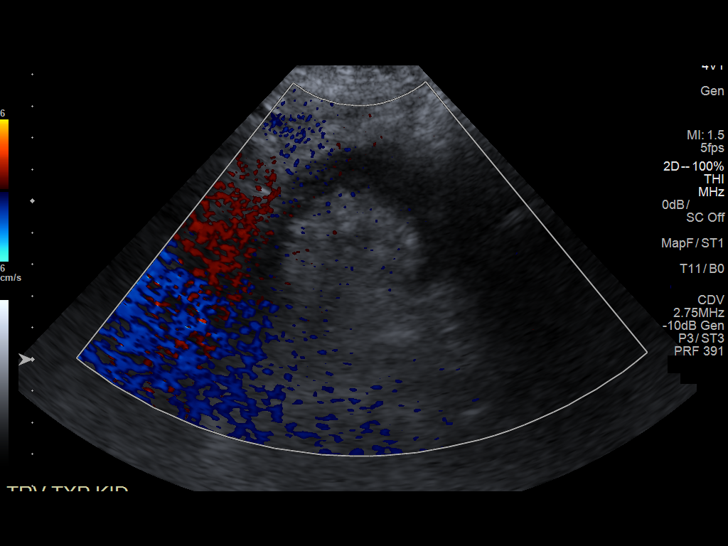
[im 12/28]
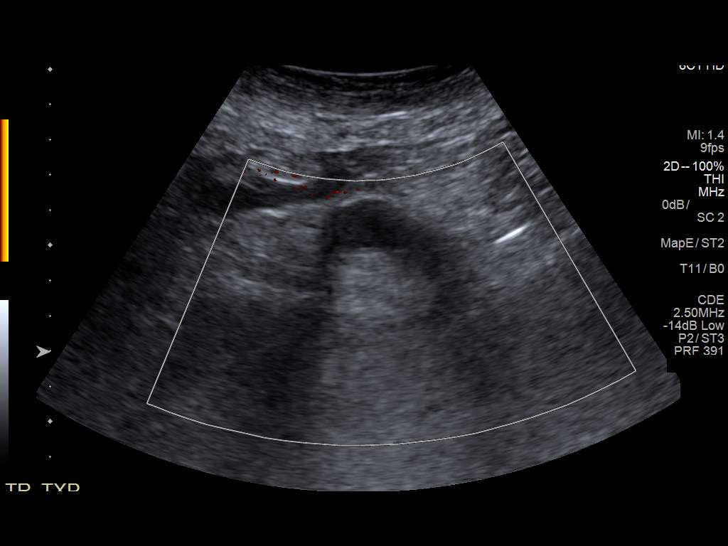
[im 14/28]
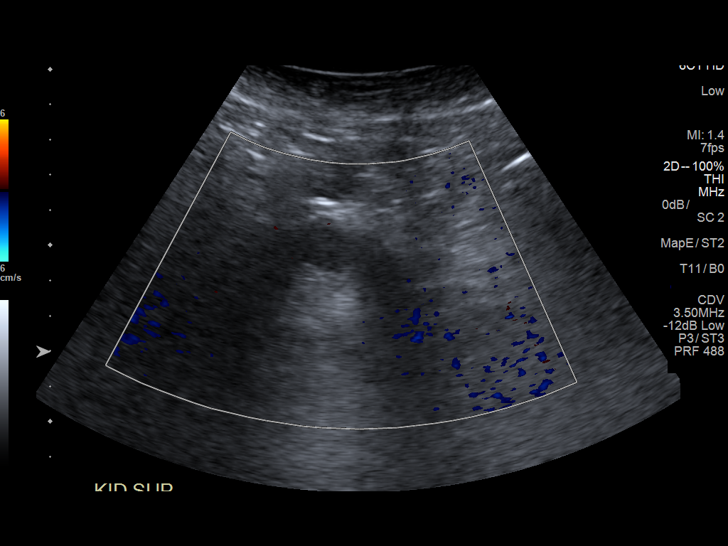
[im 16/28]
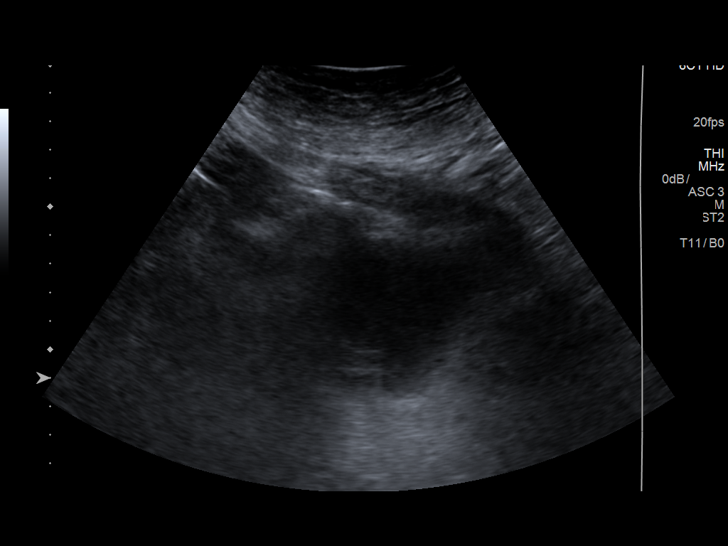
[im 19/28]
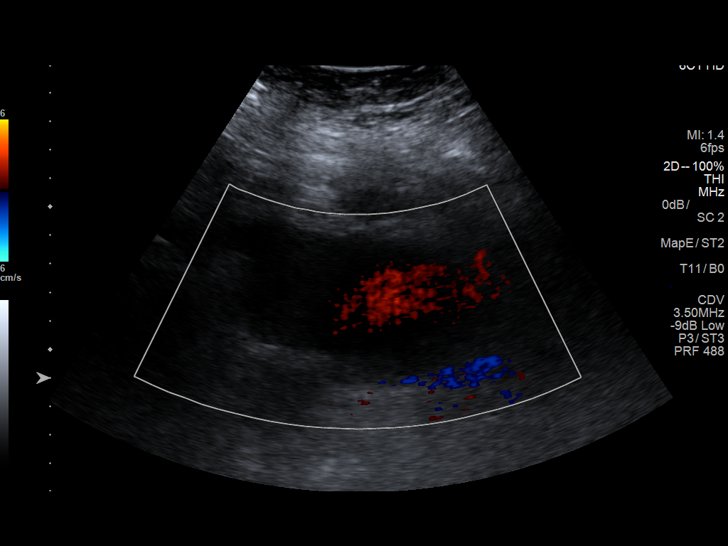
[im 21/28]
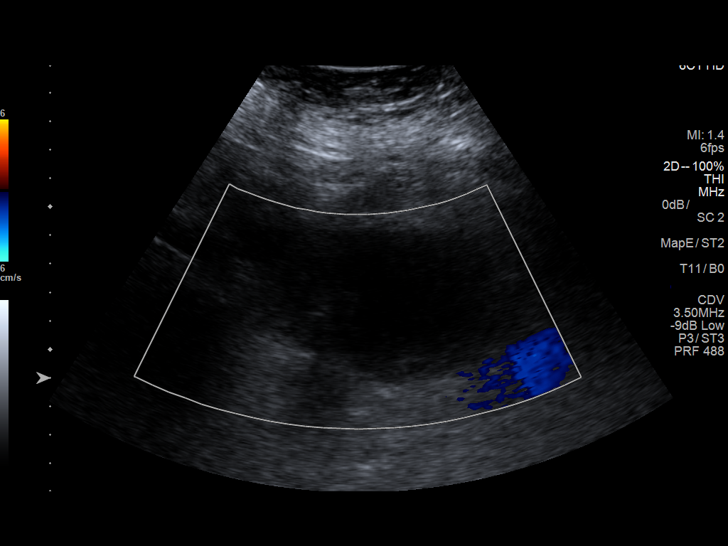
[im 23/28]
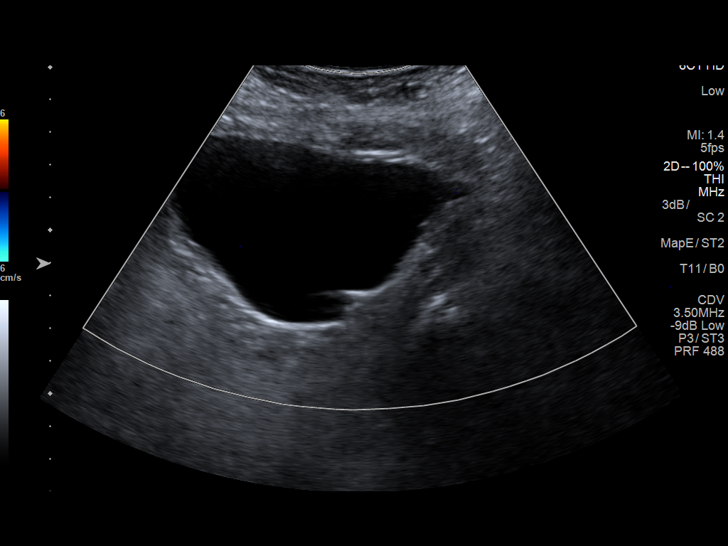
[im 25/28]
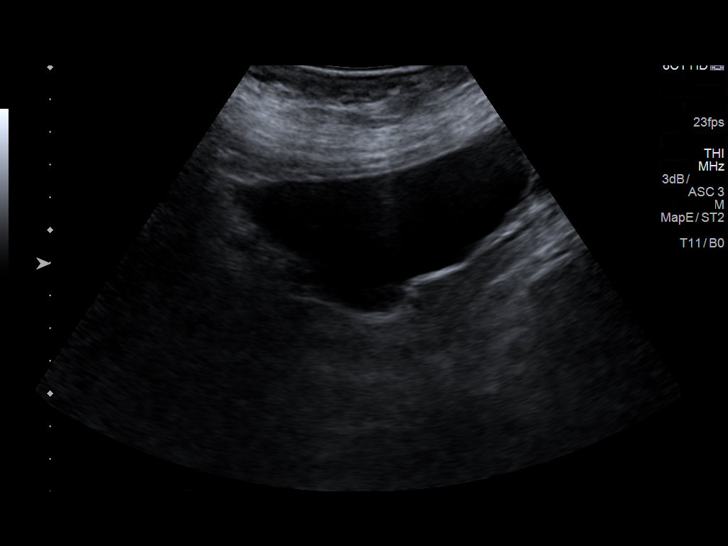
[im 28/28]
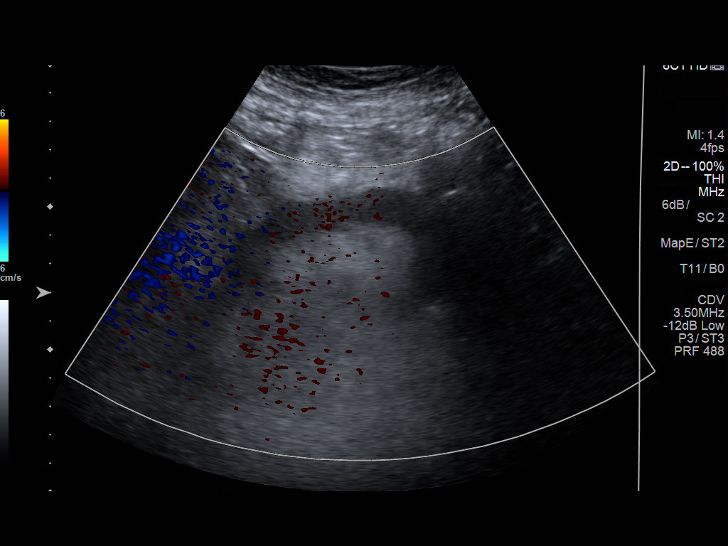

[13 of 25 positions shown; findings below may reference images not displayed]

FINDINGS: Transplant kidney location: Right lower quadrant

Transplant Kidney:

Length: 11 cm. Normal in size and parenchymal echogenicity. No
evidence of mass or hydronephrosis. No peri-transplant fluid
collection seen.

Color flow in the main renal artery:  Limited evaluation

Color flow in the main renal vein:  Limited evaluation

Duplex Doppler Evaluation:

Main Renal Artery Resistive Index: Not obtained

Venous waveform in main renal vein:  Not demonstrated

Intrarenal resistive index in upper pole:  Not obtained

(normal 0.6-0.8; equivocal 0.8-0.9; abnormal >= 0.9)

Intrarenal resistive index in lower pole: Not obtained

(normal 0.6-0.8; equivocal 0.8-0.9; abnormal >= 0.9)

Bladder: Normal for degree of bladder distention. Unilateral
ureteral jet documented.

Other findings: There is a 8.9 x 5.5 x 6.3 cm deep subcutaneous
fluid collection superior and lateral to the transplant kidney
without significant mass effect upon the kidney or collecting
system.
IMPRESSION: 1. No hydronephrosis of transplant kidney. Limited vascular
evaluation.
2. 8.9 cm deep subcutaneous fluid collection superior and lateral to
the transplant kidney without significant mass effect on the kidney.

## 2019-01-01 DIAGNOSIS — H25811 Combined forms of age-related cataract, right eye: Secondary | ICD-10-CM | POA: Diagnosis not present

## 2019-01-01 DIAGNOSIS — H2511 Age-related nuclear cataract, right eye: Secondary | ICD-10-CM | POA: Diagnosis not present

## 2019-01-08 DIAGNOSIS — Z950 Presence of cardiac pacemaker: Secondary | ICD-10-CM | POA: Diagnosis not present

## 2019-01-08 DIAGNOSIS — Z9889 Other specified postprocedural states: Secondary | ICD-10-CM | POA: Diagnosis not present

## 2019-01-08 DIAGNOSIS — N186 End stage renal disease: Secondary | ICD-10-CM | POA: Diagnosis not present

## 2019-01-08 DIAGNOSIS — Z7901 Long term (current) use of anticoagulants: Secondary | ICD-10-CM | POA: Diagnosis not present

## 2019-01-08 DIAGNOSIS — I4892 Unspecified atrial flutter: Secondary | ICD-10-CM | POA: Diagnosis not present

## 2019-01-08 DIAGNOSIS — Z940 Kidney transplant status: Secondary | ICD-10-CM | POA: Diagnosis not present

## 2019-01-08 DIAGNOSIS — I120 Hypertensive chronic kidney disease with stage 5 chronic kidney disease or end stage renal disease: Secondary | ICD-10-CM | POA: Diagnosis not present

## 2019-01-08 DIAGNOSIS — Z7982 Long term (current) use of aspirin: Secondary | ICD-10-CM | POA: Diagnosis not present

## 2019-01-08 DIAGNOSIS — I4821 Permanent atrial fibrillation: Secondary | ICD-10-CM | POA: Diagnosis not present

## 2019-01-09 ENCOUNTER — Telehealth: Payer: Self-pay | Admitting: *Deleted

## 2019-01-09 NOTE — Telephone Encounter (Signed)

## 2019-01-12 ENCOUNTER — Ambulatory Visit (INDEPENDENT_AMBULATORY_CARE_PROVIDER_SITE_OTHER)
Admission: RE | Admit: 2019-01-12 | Discharge: 2019-01-12 | Disposition: A | Discharge status: Home / Self Care | Payer: Medicare Other | Source: Ambulatory Visit | Attending: Primary Care | Admitting: Primary Care

## 2019-01-12 ENCOUNTER — Other Ambulatory Visit: Payer: Self-pay

## 2019-01-12 DIAGNOSIS — R0602 Shortness of breath: Secondary | ICD-10-CM

## 2019-01-12 DIAGNOSIS — J849 Interstitial pulmonary disease, unspecified: Secondary | ICD-10-CM | POA: Diagnosis not present

## 2019-01-12 IMAGING — CT CT CHEST HIGH RESOLUTION WITHOUT CONTRAST
2 of 5 series · 15 of 36 positions shown, 18 images · non-contrast
Comparison: [DATE].

CLINICAL DATA: Interstitial lung disease, chronic shortness of
breath.

EXAM:
CT CHEST WITHOUT CONTRAST
TECHNIQUE: Multidetector CT imaging of the chest was performed following the
standard protocol without intravenous contrast. High resolution
imaging of the lungs, as well as inspiratory and expiratory imaging,
was performed.

[Series 2: high resolution · axial · 0.57mm/px · z∈[-266,+4]mm · 12 of 149 slices shown, 15 images]
[im 7/149  mediastinal]
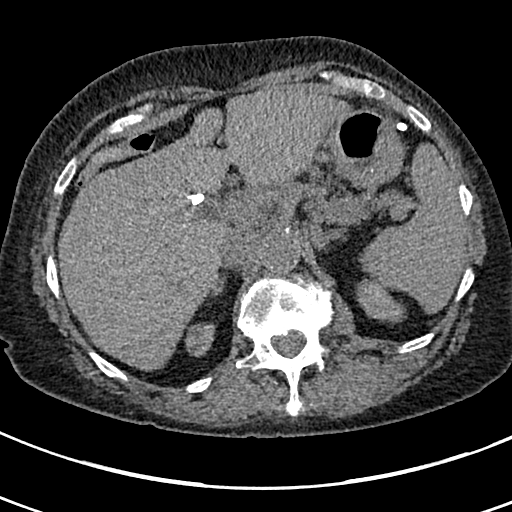
[im 7/149  lung]
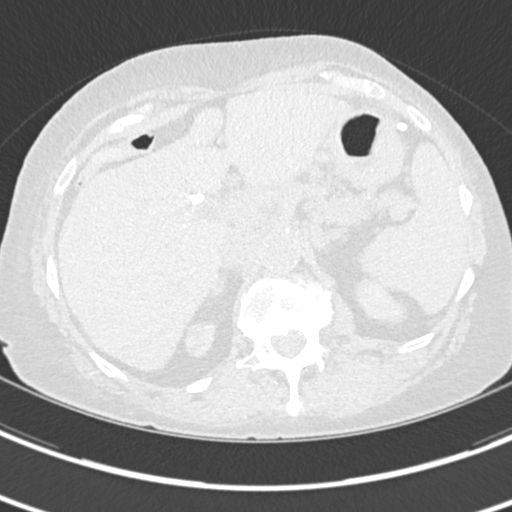
[im 21/149  lung]
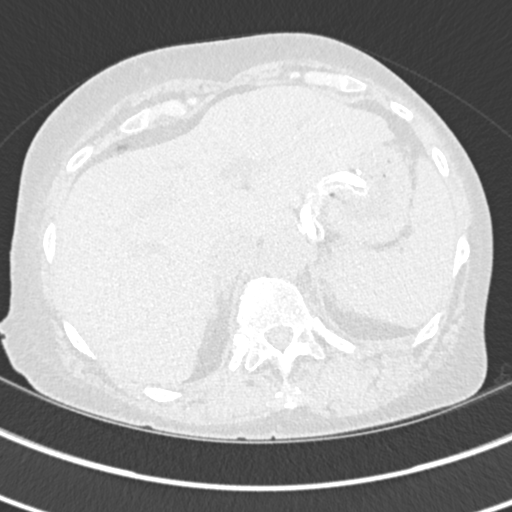
[im 34/149  lung]
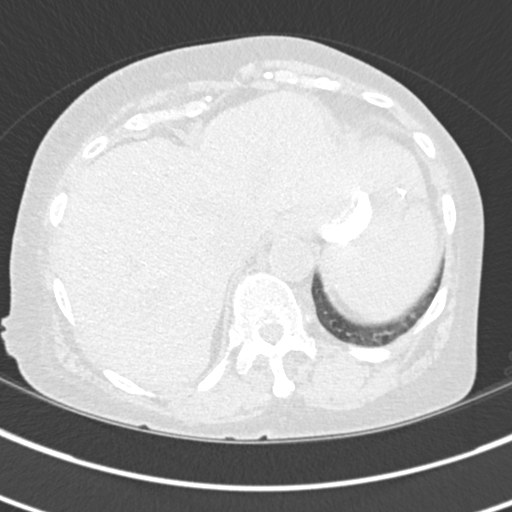
[im 48/149  lung]
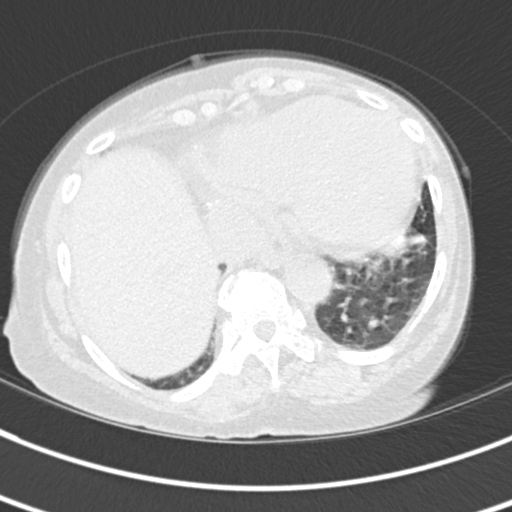
[im 54/149  mediastinal]
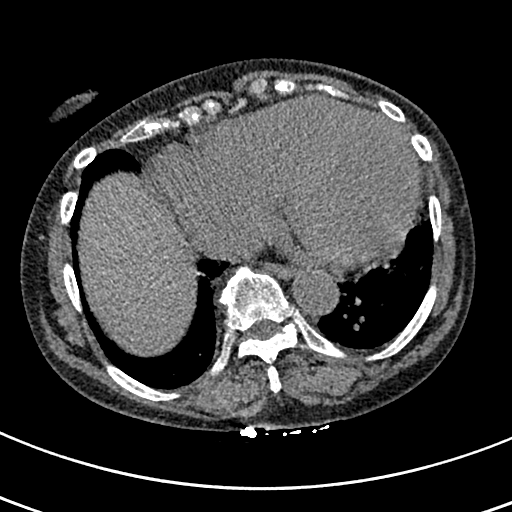
[im 54/149  lung]
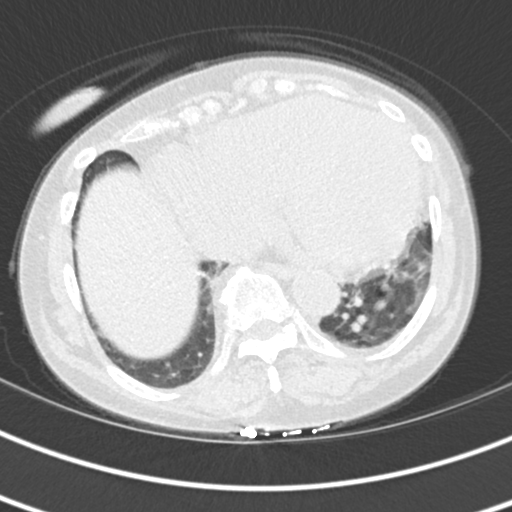
[im 68/149  lung]
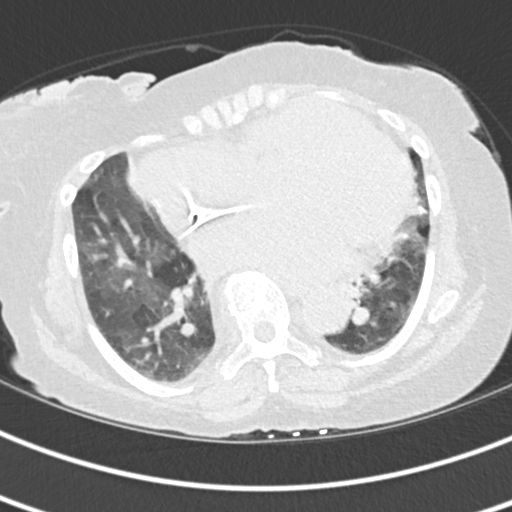
[im 81/149  lung]
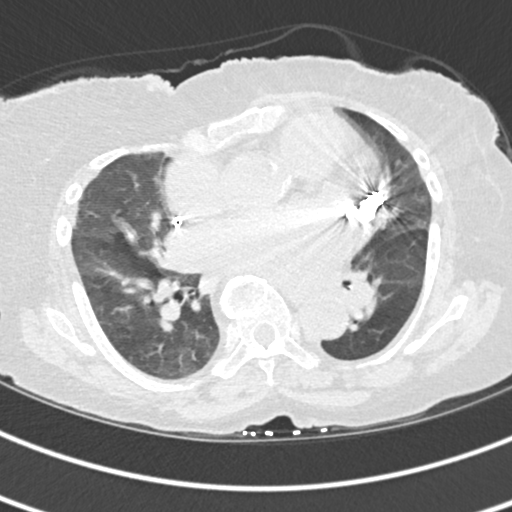
[im 95/149  lung]
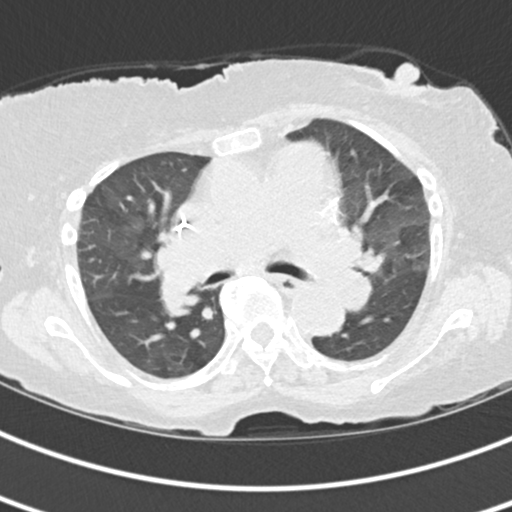
[im 101/149  mediastinal]
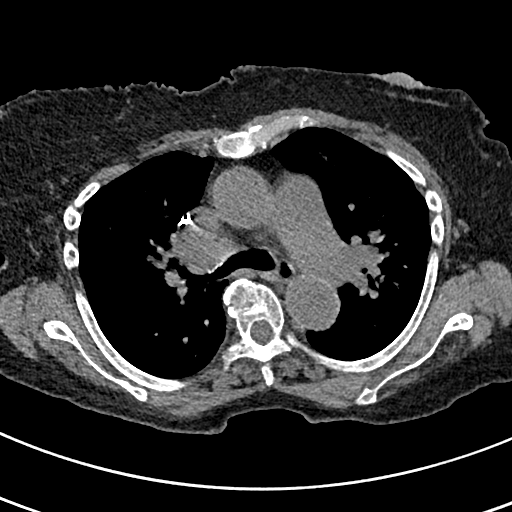
[im 101/149  lung]
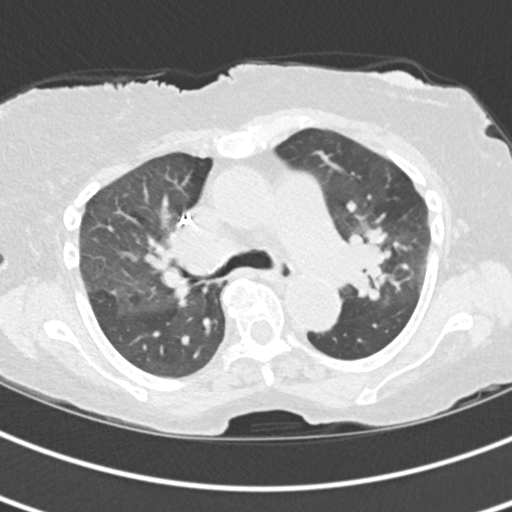
[im 115/149  lung]
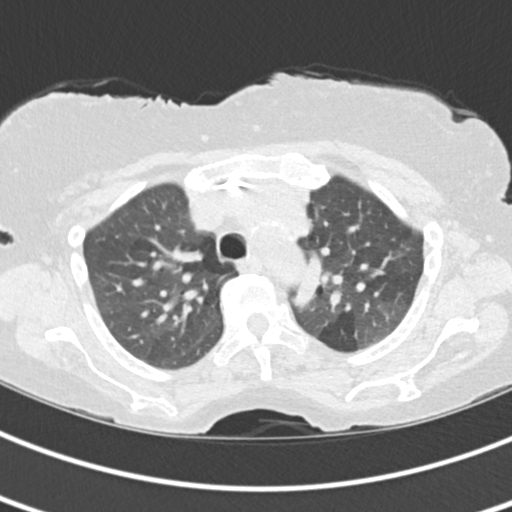
[im 128/149  lung]
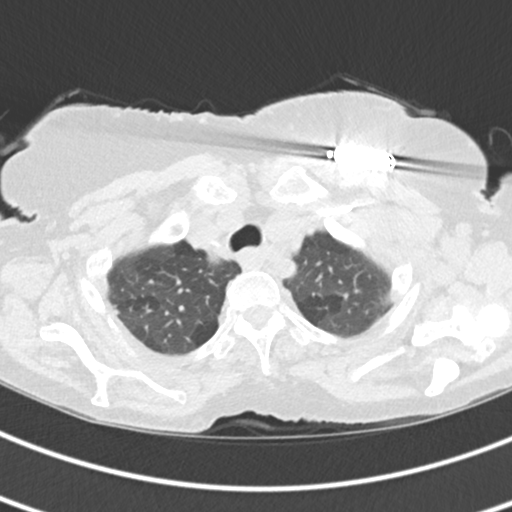
[im 142/149  lung]
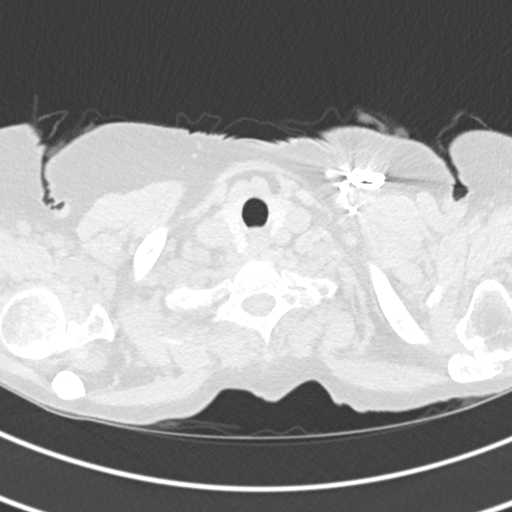

[Series 9: coronal · coronal · 0.52mm/px · 3 of 104 slices shown]
[im 21/104  lung]
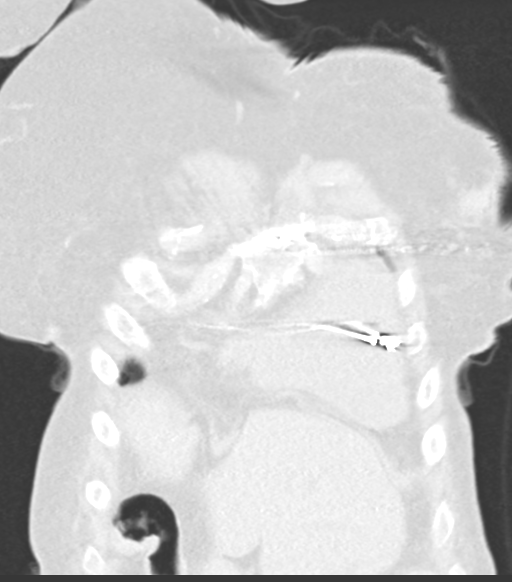
[im 42/104  lung]
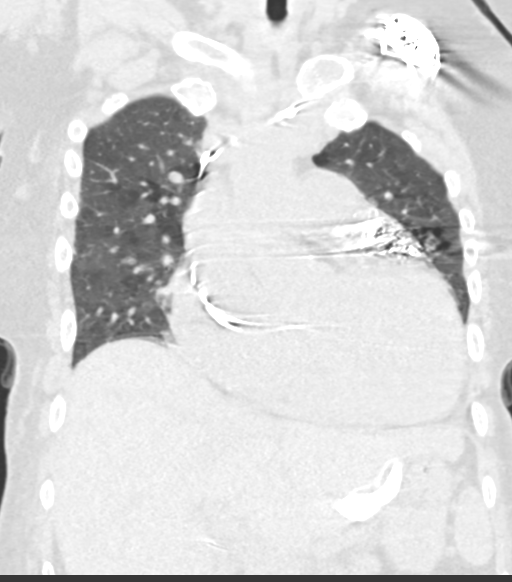
[im 62/104  lung]
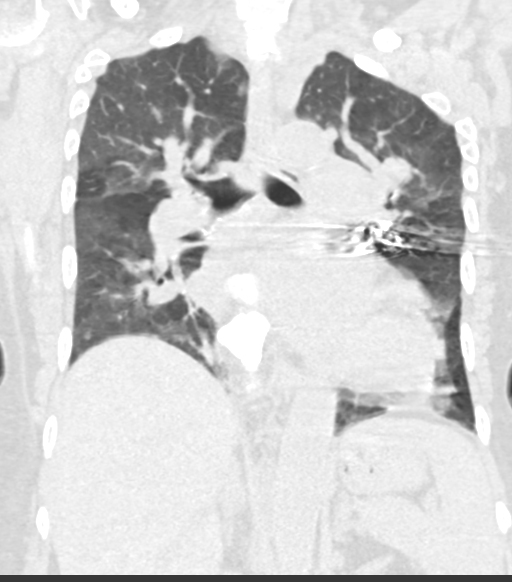

[15 of 36 positions shown; findings below may reference images not displayed]

FINDINGS: Cardiovascular: Atherosclerotic calcification of the aorta.
Pulmonary arteries and heart are enlarged. No pericardial effusion.

Mediastinum/Nodes: No pathologically enlarged mediastinal or
axillary lymph nodes. Incidental note is made of prominent
vascularity in the left axilla. Hilar regions are difficult to
evaluate without IV contrast. Esophagus is grossly unremarkable.

Lungs/Pleura: Mosaic pulmonary parenchymal attenuation. Image
quality is degraded by respiratory motion. No definitive subpleural
reticulation, traction bronchiectasis/bronchiolectasis,
architectural distortion or honeycombing. No pleural fluid. Airway
is unremarkable. There is air trapping.

Upper Abdomen: Visualized portion of the liver is unremarkable.
There may be slight nodularity in the right adrenal gland. 2.3 cm
low-attenuation lesion in the left kidney is partially imaged.
Visualized portions of the spleen and pancreas are grossly
unremarkable. Laparoscopic band is seen around the proximal stomach.

Musculoskeletal: Degenerative changes in the spine. No worrisome
lytic or sclerotic lesions. L1 compression fracture is likely
chronic. Flowing anterior osteophytosis in the thoracic spine.
IMPRESSION: 1. Assessment for interstitial lung disease is limited by
respiratory motion. No definitive evidence of fibrotic interstitial
lung disease.
2. Air trapping is indicative of small airways disease.
3.  Aortic atherosclerosis ([4K]-170.0).
4. Enlarged pulmonary arteries, indicative of pulmonary arterial
hypertension.

## 2019-01-14 ENCOUNTER — Ambulatory Visit (INDEPENDENT_AMBULATORY_CARE_PROVIDER_SITE_OTHER): Payer: Medicare Other | Admitting: Psychology

## 2019-01-14 ENCOUNTER — Ambulatory Visit: Payer: Medicare Other | Admitting: Psychology

## 2019-01-14 DIAGNOSIS — F329 Major depressive disorder, single episode, unspecified: Secondary | ICD-10-CM | POA: Diagnosis not present

## 2019-01-15 DIAGNOSIS — H25812 Combined forms of age-related cataract, left eye: Secondary | ICD-10-CM | POA: Diagnosis not present

## 2019-01-15 DIAGNOSIS — H2512 Age-related nuclear cataract, left eye: Secondary | ICD-10-CM | POA: Diagnosis not present

## 2019-01-28 DIAGNOSIS — Z45018 Encounter for adjustment and management of other part of cardiac pacemaker: Secondary | ICD-10-CM | POA: Diagnosis not present

## 2019-01-28 DIAGNOSIS — D631 Anemia in chronic kidney disease: Secondary | ICD-10-CM | POA: Diagnosis not present

## 2019-01-28 DIAGNOSIS — M109 Gout, unspecified: Secondary | ICD-10-CM | POA: Diagnosis not present

## 2019-01-28 DIAGNOSIS — R319 Hematuria, unspecified: Secondary | ICD-10-CM | POA: Diagnosis not present

## 2019-01-28 DIAGNOSIS — I770 Arteriovenous fistula, acquired: Secondary | ICD-10-CM | POA: Diagnosis not present

## 2019-01-28 DIAGNOSIS — N189 Chronic kidney disease, unspecified: Secondary | ICD-10-CM | POA: Diagnosis not present

## 2019-01-28 DIAGNOSIS — R809 Proteinuria, unspecified: Secondary | ICD-10-CM | POA: Diagnosis not present

## 2019-01-28 DIAGNOSIS — I4891 Unspecified atrial fibrillation: Secondary | ICD-10-CM | POA: Diagnosis not present

## 2019-01-28 DIAGNOSIS — Z45010 Encounter for checking and testing of cardiac pacemaker pulse generator [battery]: Secondary | ICD-10-CM | POA: Diagnosis not present

## 2019-01-28 DIAGNOSIS — I129 Hypertensive chronic kidney disease with stage 1 through stage 4 chronic kidney disease, or unspecified chronic kidney disease: Secondary | ICD-10-CM | POA: Diagnosis not present

## 2019-01-28 DIAGNOSIS — N2581 Secondary hyperparathyroidism of renal origin: Secondary | ICD-10-CM | POA: Diagnosis not present

## 2019-01-28 DIAGNOSIS — Z940 Kidney transplant status: Secondary | ICD-10-CM | POA: Diagnosis not present

## 2019-01-28 DIAGNOSIS — N184 Chronic kidney disease, stage 4 (severe): Secondary | ICD-10-CM | POA: Diagnosis not present

## 2019-01-28 DIAGNOSIS — N2589 Other disorders resulting from impaired renal tubular function: Secondary | ICD-10-CM | POA: Diagnosis not present

## 2019-01-28 DIAGNOSIS — D899 Disorder involving the immune mechanism, unspecified: Secondary | ICD-10-CM | POA: Diagnosis not present

## 2019-01-28 LAB — BASIC METABOLIC PANEL
BUN: 27 — AB (ref 4–21)
Creatinine: 1.9 — AB (ref 0.5–1.1)
Glucose: 130
Potassium: 3.8 (ref 3.4–5.3)
Sodium: 139 (ref 137–147)

## 2019-01-28 LAB — IRON,TIBC AND FERRITIN PANEL
Iron: 28
TIBC: 281
UIBC: 253

## 2019-01-28 LAB — CBC AND DIFFERENTIAL
HCT: 33 — AB (ref 36–46)
Hemoglobin: 10.8 — AB (ref 12.0–16.0)
Neutrophils Absolute: 10
Platelets: 300 (ref 150–399)
WBC: 10.9

## 2019-01-28 LAB — HEPATIC FUNCTION PANEL
ALT: 13 (ref 7–35)
AST: 19 (ref 13–35)
Alkaline Phosphatase: 160 — AB (ref 25–125)
Bilirubin, Total: 0.5

## 2019-02-02 DIAGNOSIS — N390 Urinary tract infection, site not specified: Secondary | ICD-10-CM | POA: Diagnosis not present

## 2019-02-11 ENCOUNTER — Ambulatory Visit (INDEPENDENT_AMBULATORY_CARE_PROVIDER_SITE_OTHER): Payer: Medicare Other | Admitting: Psychology

## 2019-02-11 DIAGNOSIS — F329 Major depressive disorder, single episode, unspecified: Secondary | ICD-10-CM

## 2019-03-02 ENCOUNTER — Ambulatory Visit (INDEPENDENT_AMBULATORY_CARE_PROVIDER_SITE_OTHER): Payer: Medicare Other | Admitting: Psychology

## 2019-03-02 DIAGNOSIS — F329 Major depressive disorder, single episode, unspecified: Secondary | ICD-10-CM | POA: Diagnosis not present

## 2019-03-04 ENCOUNTER — Encounter: Payer: Self-pay | Admitting: Nurse Practitioner

## 2019-03-04 NOTE — Progress Notes (Signed)
Abstracted result and sent to scan  

## 2019-03-16 ENCOUNTER — Ambulatory Visit (INDEPENDENT_AMBULATORY_CARE_PROVIDER_SITE_OTHER): Payer: Medicare Other | Admitting: Psychology

## 2019-03-16 DIAGNOSIS — F4322 Adjustment disorder with anxiety: Secondary | ICD-10-CM

## 2019-04-06 ENCOUNTER — Ambulatory Visit (INDEPENDENT_AMBULATORY_CARE_PROVIDER_SITE_OTHER): Payer: Medicare Other | Admitting: Psychology

## 2019-04-06 DIAGNOSIS — F4322 Adjustment disorder with anxiety: Secondary | ICD-10-CM | POA: Diagnosis not present

## 2019-04-21 ENCOUNTER — Other Ambulatory Visit: Payer: Self-pay

## 2019-04-22 ENCOUNTER — Ambulatory Visit (INDEPENDENT_AMBULATORY_CARE_PROVIDER_SITE_OTHER): Payer: Medicare Other | Admitting: Nurse Practitioner

## 2019-04-22 ENCOUNTER — Encounter: Payer: Self-pay | Admitting: Nurse Practitioner

## 2019-04-22 ENCOUNTER — Other Ambulatory Visit: Payer: Self-pay

## 2019-04-22 VITALS — BP 140/80 | HR 71 | Temp 98.2°F | Ht 65.0 in | Wt 152.2 lb

## 2019-04-22 DIAGNOSIS — B3783 Candidal cheilitis: Secondary | ICD-10-CM | POA: Diagnosis not present

## 2019-04-22 DIAGNOSIS — B370 Candidal stomatitis: Secondary | ICD-10-CM

## 2019-04-22 MED ORDER — NYSTATIN 100000 UNIT/ML MT SUSP: 5.0000 mL | Freq: Four times a day (QID) | OROMUCOSAL | 1 refills | Status: DC

## 2019-04-22 MED ORDER — MAGIC MOUTHWASH: 10.0000 mL | Freq: Three times a day (TID) | ORAL | 0 refills | Status: DC

## 2019-04-22 MED ORDER — FIRST-DUKES MOUTHWASH MT SUSP: 5.0000 mL | Freq: Four times a day (QID) | OROMUCOSAL | 0 refills | Status: DC

## 2019-04-22 NOTE — Progress Notes (Signed)
Subjective:  Patient ID: [name], female    DOB: Feb 09, 1946  Age: 73 y.o. MRN: [id number]  CC: Mouth Lesions (Flu vaccine- Pt. been having bumps/sores around her mouth and she's also having lil bumps inside her mouth. Pt been having a hard time eating due to mouth sores. Ongoing prob for months)  Mouth Lesions  The current episode started more than 2 weeks ago. The onset was gradual. The problem occurs continuously. The problem has been gradually worsening. The problem is severe. Nothing relieves the symptoms. The symptoms are aggravated by eating and drinking. Associated symptoms include mouth sores and sore throat. Pertinent negatives include no fever, no nausea, no congestion, no rhinorrhea, no stridor, no swollen glands, no muscle aches, no cough and no URI. She has been behaving normally. She has been drinking less than usual and eating less than usual. There were no sick contacts. She has received no recent medical care.  reports she adequate oral care daily and after use of symbicort inhaler  Reviewed past Medical, Social and Family history today.  Outpatient Medications Prior to Visit  Medication Sig Dispense Refill  . acetaminophen (TYLENOL) 500 MG tablet Take 500 mg by mouth every 6 (six) hours as needed.    Marland Kitchen albuterol (PROVENTIL) (2.5 MG/3ML) 0.083% nebulizer solution Take 3 mLs (2.5 mg total) by nebulization every 6 (six) hours as needed for wheezing or shortness of breath. 150 mL 0  . aspirin EC 81 MG tablet Take 81 mg by mouth.    . budesonide-formoterol (SYMBICORT) 80-4.5 MCG/ACT inhaler Inhale 2 puffs into the lungs 2 (two) times daily. 1 Inhaler 6  . budesonide-formoterol (SYMBICORT) 80-4.5 MCG/ACT inhaler Inhale 2 puffs into the lungs 2 (two) times daily. 1 Inhaler 2  . calcitRIOL (ROCALTROL) 0.25 MCG capsule Take by mouth daily.    Marland Kitchen docusate sodium (COLACE) 100 MG capsule Take 100 mg by mouth daily as needed (For constipation.).     Marland Kitchen Ferrous Sulfate (IRON PO) Take  by mouth.    . furosemide (LASIX) 80 MG tablet Take 80 mg by mouth 2 (two) times daily.    . Magnesium 400 MG TABS Take by mouth 2 (two) times daily.    . mycophenolate (MYFORTIC) 180 MG EC tablet Take 180 mg by mouth 2 (two) times daily.    . potassium chloride SA (K-DUR,KLOR-CON) 20 MEQ tablet Take 20 mEq by mouth.     . predniSONE (DELTASONE) 5 MG tablet Take 5 mg by mouth daily with breakfast.    . sertraline (ZOLOFT) 100 MG tablet Take 1 tablet (100 mg total) by mouth daily. 90 tablet 3  . SODIUM BICARBONATE PO Take by mouth. 10 grain--taking 2 tab PO BID    . SYMBICORT 160-4.5 MCG/ACT inhaler TAKE 2 PUFFS BY MOUTH TWICE A DAY 30.6 Inhaler 1  . tacrolimus (PROGRAF) 1 MG capsule Take 2 mg by mouth daily. 2 mg po q am and 1 mg po q HS    . Mouthwash Compounding Base (MOUTH WASH-GP PO) Take by mouth. MUCOSITIS MOUTH WASH-Give 36mls by mouth 4 times daily for 10 days for oral mucositis    . clopidogrel (PLAVIX) 75 MG tablet Take 75 mg by mouth daily.     No facility-administered medications prior to visit.     ROS See HPI  Objective:  BP 140/80   Pulse 71   Temp 98.2 F (36.8 C) (Oral)   Ht 5\' 5"  (1.651 m)   Wt 152 lb 3.2 oz (69  kg)   SpO2 98%   BMI 25.33 kg/m   BP Readings from Last 3 Encounters:  04/22/19 140/80  12/29/18 (!) 144/82  11/25/18 (!) 142/78    Wt Readings from Last 3 Encounters:  04/22/19 152 lb 3.2 oz (69 kg)  12/29/18 151 lb (68.5 kg)  11/25/18 156 lb (70.8 kg)    Physical Exam HENT:     Mouth/Throat:     Mouth: Oral lesions present.     Tongue: Lesions present.     Palate: Lesions present.     Pharynx: Uvula midline. No oropharyngeal exudate or uvula swelling.   Neck:     Musculoskeletal: Normal range of motion and neck supple.  Lymphadenopathy:     Cervical: No cervical adenopathy.     Lab Results  Component Value Date   WBC 10.9 01/28/2019   HGB 10.8 (A) 01/28/2019   HCT 33 (A) 01/28/2019   PLT 300 01/28/2019   GLUCOSE 89 02/05/2018    CHOL  11/16/2008    184        ATP III CLASSIFICATION:  <200     mg/dL   Desirable  200-239  mg/dL   Borderline High  >=240    mg/dL   High          TRIG 157 (H) 09/15/2011   HDL 33 (L) 11/16/2008   LDLCALC (H) 11/16/2008    130        Total Cholesterol/HDL:CHD Risk Coronary Heart Disease Risk Table                     Men   Women  1/2 Average Risk   3.4   3.3  Average Risk       5.0   4.4  2 X Average Risk   9.6   7.1  3 X Average Risk  23.4   11.0        Use the calculated Patient Ratio above and the CHD Risk Table to determine the patient's CHD Risk.        ATP III CLASSIFICATION (LDL):  <100     mg/dL   Optimal  100-129  mg/dL   Near or Above                    Optimal  130-159  mg/dL   Borderline  160-189  mg/dL   High  >190     mg/dL   Very High   ALT 13 01/28/2019   AST 19 01/28/2019   NA 139 01/28/2019   K 3.8 01/28/2019   CL 99 02/05/2018   CREATININE 1.9 (A) 01/28/2019   BUN 27 (A) 01/28/2019   CO2 24 02/05/2018   TSH 2.655 09/11/2011   INR 1.90 11/03/2017   HGBA1C  11/16/2008    5.5 (NOTE) The ADA recommends the following therapeutic goal for glycemic control related to Hgb A1c measurement: Goal of therapy: <6.5 Hgb A1c  Reference: American Diabetes Association: Clinical Practice Recommendations 2010, Diabetes Care, 2010, 33: (Suppl  1).    Ct Chest High Resolution  Result Date: 01/12/2019 CLINICAL DATA:  Interstitial lung disease, chronic shortness of breath. EXAM: CT CHEST WITHOUT CONTRAST TECHNIQUE: Multidetector CT imaging of the chest was performed following the standard protocol without intravenous contrast. High resolution imaging of the lungs, as well as inspiratory and expiratory imaging, was performed. COMPARISON:  12/05/2017. FINDINGS: Cardiovascular: Atherosclerotic calcification of the aorta. Pulmonary arteries and heart are enlarged. No pericardial effusion. Mediastinum/Nodes: No  pathologically enlarged mediastinal or axillary lymph nodes.  Incidental note is made of prominent vascularity in the left axilla. Hilar regions are difficult to evaluate without IV contrast. Esophagus is grossly unremarkable. Lungs/Pleura: Mosaic pulmonary parenchymal attenuation. Image quality is degraded by respiratory motion. No definitive subpleural reticulation, traction bronchiectasis/bronchiolectasis, architectural distortion or honeycombing. No pleural fluid. Airway is unremarkable. There is air trapping. Upper Abdomen: Visualized portion of the liver is unremarkable. There may be slight nodularity in the right adrenal gland. 2.3 cm low-attenuation lesion in the left kidney is partially imaged. Visualized portions of the spleen and pancreas are grossly unremarkable. Laparoscopic band is seen around the proximal stomach. Musculoskeletal: Degenerative changes in the spine. No worrisome lytic or sclerotic lesions. L1 compression fracture is likely chronic. Flowing anterior osteophytosis in the thoracic spine. IMPRESSION: 1. Assessment for interstitial lung disease is limited by respiratory motion. No definitive evidence of fibrotic interstitial lung disease. 2. 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 3.  Aortic atherosclerosis (ICD10-170.0). 4. Enlarged pulmonary arteries, indicative of pulmonary arterial hypertension. Electronically Signed   By: Lorin Picket M.D.   On: 01/12/2019 13:07    Assessment & Plan:   [name] was seen today for mouth lesions.  Diagnoses and all orders for this visit:  Thrush, oral -     Discontinue: magic mouthwash SOLN; Take 10 mLs by mouth 3 (three) times daily. Equal parts of diphenhydramine HCL, alum&mag hydroxide-simeth, and nystatin. -     Discontinue: Diphenhyd-Hydrocort-Nystatin ([facility] MOUTHWASH) SUSP; Use as directed 5 mLs in the mouth or throat every 6 (six) hours. Swish and swallow -     nystatin (MYCOSTATIN) 100000 UNIT/ML suspension; Use as directed 5 mLs (500,000 Units total) in the mouth or throat 4  (four) times daily.  Candidal cheilitis -     nystatin (MYCOSTATIN) 100000 UNIT/ML suspension; Use as directed 5 mLs (500,000 Units total) in the mouth or throat 4 (four) times daily.   I have discontinued [name]'s Mouthwash Compounding Base (MOUTH WASH-GP PO), magic mouthwash, and [facility] Mouthwash. I am also having her start on nystatin. Additionally, I am having her maintain her docusate sodium, aspirin EC, mycophenolate, predniSONE, acetaminophen, furosemide, potassium chloride SA, Magnesium, clopidogrel, tacrolimus, budesonide-formoterol, Symbicort, calcitRIOL, albuterol, Ferrous Sulfate (IRON PO), SODIUM BICARBONATE PO, sertraline, and budesonide-formoterol.  Meds ordered this encounter  Medications  . DISCONTD: magic mouthwash SOLN    Sig: Take 10 mLs by mouth 3 (three) times daily. Equal parts of diphenhydramine HCL, alum&mag hydroxide-simeth, and nystatin.    Dispense:  150 mL    Refill:  0    Order Specific Question:   Supervising Provider    Answer:   Lucille Passy [[id number]]  . DISCONTD: Diphenhyd-Hydrocort-Nystatin (FIRST-[facility] MOUTHWASH) SUSP    Sig: Use as directed 5 mLs in the mouth or throat every 6 (six) hours. Swish and swallow    Dispense:  237 mL    Refill:  0    Order Specific Question:   Supervising Provider    Answer:   Lucille Passy [[id number]]  . nystatin (MYCOSTATIN) 100000 UNIT/ML suspension    Sig: Use as directed 5 mLs (500,000 Units total) in the mouth or throat 4 (four) times daily.    Dispense:  100 mL    Refill:  1    Discontinue [facility] magic mouthwash rx    Order Specific Question:   Supervising Provider    Answer:   Lucille Passy [[id number]]    Problem List Items Addressed This  Visit    None    Visit Diagnoses    Thrush, oral    -  Primary   Relevant Medications   nystatin (MYCOSTATIN) 100000 UNIT/ML suspension   Candidal cheilitis       Relevant Medications   nystatin (MYCOSTATIN) 100000 UNIT/ML suspension      Follow-up: Return if symptoms  worsen or fail to improve.  Wilfred Lacy, NP

## 2019-04-22 NOTE — Patient Instructions (Signed)
Hold symbicort inhaler for 2days. Use mouth wash 21mins prior to food intake (swish and swallow).   Oral Thrush, Adult  Oral thrush is an infection in your mouth and throat. It causes white patches on your tongue and in your mouth. Follow these instructions at home: Helping with soreness   To lessen your pain: ? Drink cold liquids, like water and iced tea. ? Eat frozen ice pops or frozen juices. ? Eat foods that are easy to swallow, like gelatin and ice cream. ? Drink from a straw if the patches in your mouth are painful. General instructions  Take or use over-the-counter and prescription medicines only as told by your doctor. Medicine for oral thrush may be something to swallow, or it may be something to put on the infected area.  Eat plain yogurt that has live cultures in it. Read the label to make sure.  If you wear dentures: ? Take out your dentures before you go to bed. ? Brush them well. ? Soak them in a denture cleaner.  Rinse your mouth with warm salt-water many times a day. To make the salt-water mixture, completely dissolve 1/2-1 teaspoon of salt in 1 cup of warm water. Contact a doctor if:  Your problems are getting worse.  Your problems do not get better in less than 7 days with treatment.  Your infection is spreading. This may show as white patches on the skin outside of your mouth.  You are nursing your baby and you have redness and pain in the nipples. This information is not intended to replace advice given to you by your health care provider. Make sure you discuss any questions you have with your health care provider. Document Released: 09/26/2009 Document Revised: 10/04/2017 Document Reviewed: 03/26/2016 Elsevier Patient Education  2020 [employer].

## 2019-04-27 ENCOUNTER — Telehealth: Payer: Self-pay | Admitting: Nurse Practitioner

## 2019-04-27 ENCOUNTER — Ambulatory Visit (INDEPENDENT_AMBULATORY_CARE_PROVIDER_SITE_OTHER): Payer: Medicare Other | Admitting: Psychology

## 2019-04-27 DIAGNOSIS — F4322 Adjustment disorder with anxiety: Secondary | ICD-10-CM | POA: Diagnosis not present

## 2019-04-27 NOTE — Telephone Encounter (Signed)
Questions for Screening COVID-19  Symptom onset: n/a  Travel or Contacts: no During this illness, did/does the patient experience any of the following symptoms? Fever >100.4F []  Yes [x]  No []  Unknown Subjective fever (felt feverish) []  Yes [x]  No []  Unknown Chills []  Yes [x]  No []  Unknown Muscle aches (myalgia) []  Yes [x]  No []  Unknown Runny nose (rhinorrhea) []  Yes [x]  No []  Unknown Sore throat []  Yes [x]  No []  Unknown Cough (new onset or worsening of chronic cough) []  Yes [x]  No []  Unknown Shortness of breath (dyspnea) []  Yes [x]  No []  Unknown Nausea or vomiting []  Yes [x]  No []  Unknown Headache []  Yes [x]  No []  Unknown Abdominal pain  []  Yes [x]  No []  Unknown Diarrhea (?3 loose/looser than normal stools/24hr period) []  Yes [x]  No []  Unknown Other, specify:  

## 2019-04-28 ENCOUNTER — Other Ambulatory Visit: Payer: Self-pay

## 2019-04-28 ENCOUNTER — Encounter: Payer: Self-pay | Admitting: Nurse Practitioner

## 2019-04-28 ENCOUNTER — Telehealth: Payer: Self-pay | Admitting: Nurse Practitioner

## 2019-04-28 ENCOUNTER — Ambulatory Visit (INDEPENDENT_AMBULATORY_CARE_PROVIDER_SITE_OTHER): Payer: Medicare Other

## 2019-04-28 DIAGNOSIS — B370 Candidal stomatitis: Secondary | ICD-10-CM

## 2019-04-28 DIAGNOSIS — B3783 Candidal cheilitis: Secondary | ICD-10-CM

## 2019-04-28 DIAGNOSIS — Z23 Encounter for immunization: Secondary | ICD-10-CM

## 2019-04-28 NOTE — Progress Notes (Signed)
Pt came into the office for her flu shot. Vaccine given in the right deltoid. Pt tolerated injection well. No signs/symptoms of a reaction prior to pt leaving the office.

## 2019-04-28 NOTE — Telephone Encounter (Signed)
Patient came into office to get flu shot. Patient stated that she wanted to let you know that the medication you prescribed her last week is working well and wants to know if you can send in a refill for the medication. Please advise and call patient.

## 2019-04-28 NOTE — Telephone Encounter (Signed)
LVM for the pt to call back, need to make sure medication name.

## 2019-04-29 DIAGNOSIS — Z950 Presence of cardiac pacemaker: Secondary | ICD-10-CM | POA: Diagnosis not present

## 2019-04-29 DIAGNOSIS — I4891 Unspecified atrial fibrillation: Secondary | ICD-10-CM | POA: Diagnosis not present

## 2019-04-29 DIAGNOSIS — Z45010 Encounter for checking and testing of cardiac pacemaker pulse generator [battery]: Secondary | ICD-10-CM | POA: Diagnosis not present

## 2019-04-29 MED ORDER — NYSTATIN 100000 UNIT/ML MT SUSP: 5.0000 mL | Freq: Four times a day (QID) | OROMUCOSAL | 1 refills | Status: DC

## 2019-04-29 NOTE — Telephone Encounter (Signed)
Pt stated mouth wash is helping and request more sent into pharmacy. Rx sent--ok per [name].

## 2019-05-13 DIAGNOSIS — I129 Hypertensive chronic kidney disease with stage 1 through stage 4 chronic kidney disease, or unspecified chronic kidney disease: Secondary | ICD-10-CM | POA: Diagnosis not present

## 2019-05-13 DIAGNOSIS — N184 Chronic kidney disease, stage 4 (severe): Secondary | ICD-10-CM | POA: Diagnosis not present

## 2019-05-13 DIAGNOSIS — D849 Immunodeficiency, unspecified: Secondary | ICD-10-CM | POA: Diagnosis not present

## 2019-05-13 DIAGNOSIS — D631 Anemia in chronic kidney disease: Secondary | ICD-10-CM | POA: Diagnosis not present

## 2019-05-13 DIAGNOSIS — N189 Chronic kidney disease, unspecified: Secondary | ICD-10-CM | POA: Diagnosis not present

## 2019-05-13 DIAGNOSIS — I770 Arteriovenous fistula, acquired: Secondary | ICD-10-CM | POA: Diagnosis not present

## 2019-05-13 DIAGNOSIS — Z940 Kidney transplant status: Secondary | ICD-10-CM | POA: Diagnosis not present

## 2019-05-13 DIAGNOSIS — R319 Hematuria, unspecified: Secondary | ICD-10-CM | POA: Diagnosis not present

## 2019-05-13 DIAGNOSIS — N2581 Secondary hyperparathyroidism of renal origin: Secondary | ICD-10-CM | POA: Diagnosis not present

## 2019-05-13 DIAGNOSIS — R809 Proteinuria, unspecified: Secondary | ICD-10-CM | POA: Diagnosis not present

## 2019-05-13 DIAGNOSIS — M109 Gout, unspecified: Secondary | ICD-10-CM | POA: Diagnosis not present

## 2019-05-13 DIAGNOSIS — N2589 Other disorders resulting from impaired renal tubular function: Secondary | ICD-10-CM | POA: Diagnosis not present

## 2019-05-25 ENCOUNTER — Ambulatory Visit (INDEPENDENT_AMBULATORY_CARE_PROVIDER_SITE_OTHER): Payer: Medicare Other | Admitting: Psychology

## 2019-05-25 DIAGNOSIS — F4322 Adjustment disorder with anxiety: Secondary | ICD-10-CM | POA: Diagnosis not present

## 2019-05-28 ENCOUNTER — Ambulatory Visit: Payer: Medicare Other | Admitting: Nurse Practitioner

## 2019-05-28 ENCOUNTER — Telehealth: Payer: Self-pay | Admitting: Nurse Practitioner

## 2019-05-28 NOTE — Telephone Encounter (Signed)
Questions for Screening COVID-19  Symptom onset: n/a  Travel or Contacts: no  During this illness, did/does the patient experience any of the following symptoms? Fever >100.4F []   Yes [x]   No []   Unknown Subjective fever (felt feverish) []   Yes [x]   No []   Unknown Chills []   Yes [x]   No []   Unknown Muscle aches (myalgia) []   Yes [x]   No []   Unknown Runny nose (rhinorrhea) []   Yes [x]   No []   Unknown Sore throat []   Yes [x]   No []   Unknown Cough (new onset or worsening of chronic cough) []   Yes [x]   No []   Unknown Shortness of breath (dyspnea) []   Yes [x]   No []   Unknown Nausea or vomiting []   Yes [x]   No []   Unknown Headache []   Yes [x]   No []   Unknown Abdominal pain  []   Yes [x]   No []   Unknown Diarrhea (?3 loose/looser than normal stools/24hr period) []   Yes [x]   No []   Unknown Other, specify:   Spouse will be coming in as well to help ambulate, spouse prescreened--denied any symptoms.

## 2019-05-29 ENCOUNTER — Ambulatory Visit (INDEPENDENT_AMBULATORY_CARE_PROVIDER_SITE_OTHER): Payer: Medicare Other | Admitting: Nurse Practitioner

## 2019-05-29 ENCOUNTER — Encounter: Payer: Self-pay | Admitting: Nurse Practitioner

## 2019-05-29 ENCOUNTER — Other Ambulatory Visit: Payer: Self-pay

## 2019-05-29 VITALS — BP 150/80 | HR 71 | Temp 96.2°F | Ht 65.0 in | Wt 154.6 lb

## 2019-05-29 DIAGNOSIS — Z940 Kidney transplant status: Secondary | ICD-10-CM | POA: Diagnosis not present

## 2019-05-29 DIAGNOSIS — F322 Major depressive disorder, single episode, severe without psychotic features: Secondary | ICD-10-CM

## 2019-05-29 DIAGNOSIS — N184 Chronic kidney disease, stage 4 (severe): Secondary | ICD-10-CM | POA: Diagnosis not present

## 2019-05-29 NOTE — Patient Instructions (Signed)
Continue current medication and therapy session. Maintain daily exercise: walking 10-59mins 2-3x/day.

## 2019-05-29 NOTE — Assessment & Plan Note (Signed)
No change in mood with zoloft 100mg  and therapy sessions. Her husband expresses concern about her lack of motivation/interest with maintaining daily exercise regimen. [name] states she wants to do things at her own pace. She also admits to spending most days in bed and watching TV. She states she does not see the need in moving all the time. She is not interested in change on medication at this time.  Continue zoloft, encourage to engage in daily outdoor activities and/or home exercises. Continue therapy sessions Consider changing zoloft to cymbalta or wellbutrin? F/up in 109month

## 2019-05-29 NOTE — Progress Notes (Signed)
Subjective:  Patient ID: [name], female    DOB: 01/06/1946  Age: 73 y.o. MRN: [id number]  CC: Follow-up (follow up an anxiety--up and down/) Accompanied by husband  HPI Anxiety and Depression: No change in mood with zoloft 100mg  and therapy sessions. Her husband expresses concern about her lack of motivation/interest with maintaining daily exercise regimen. [name] states she wants to do things at her own pace. She also admits to spending most days in bed and watching TV. She states she does not see the need in moving all the time. She is not interested in change on medication at this time. Depression screen [facility] 2/9 05/29/2019 11/25/2018 07/01/2018  Decreased Interest 2 0 1  Down, Depressed, Hopeless 1 0 2  PHQ - 2 Score 3 0 3  Altered sleeping 2 0 1  Tired, decreased energy 2 1 2   Change in appetite 3 1 2   Feeling bad or failure about yourself  1 0 3  Trouble concentrating 0 0 1  Moving slowly or fidgety/restless 1 0 1  Suicidal thoughts 0 0 0  PHQ-9 Score 12 2 13   Difficult doing work/chores - - -  Some recent data might be hidden   GAD 7 : Generalized Anxiety Score 05/29/2019 11/25/2018 07/01/2018  Nervous, Anxious, on Edge 2 1 1   Control/stop worrying 1 0 1  Worry too much - different things 1 0 1  Trouble relaxing 0 0 2  Restless 1 0 1  Easily annoyed or irritable 2 1 2   Afraid - awful might happen 1 1 2   Total GAD 7 Score 8 3 10    Reviewed past Medical, Social and Family history today.  Outpatient Medications Prior to Visit  Medication Sig Dispense Refill  . acetaminophen (TYLENOL) 500 MG tablet Take 500 mg by mouth every 6 (six) hours as needed.    Marland Kitchen albuterol (PROVENTIL) (2.5 MG/3ML) 0.083% nebulizer solution Take 3 mLs (2.5 mg total) by nebulization every 6 (six) hours as needed for wheezing or shortness of breath. 150 mL 0  . aspirin EC 81 MG tablet Take 81 mg by mouth.    . budesonide-formoterol (SYMBICORT) 80-4.5 MCG/ACT inhaler Inhale 2 puffs into  the lungs 2 (two) times daily. 1 Inhaler 6  . docusate sodium (COLACE) 100 MG capsule Take 100 mg by mouth daily as needed (For constipation.).     Marland Kitchen Ferrous Sulfate (IRON PO) Take by mouth.    . furosemide (LASIX) 80 MG tablet Take 80 mg by mouth 2 (two) times daily.    . Magnesium 400 MG TABS Take by mouth 2 (two) times daily.    . mycophenolate (MYFORTIC) 180 MG EC tablet Take 180 mg by mouth 2 (two) times daily.    Marland Kitchen nystatin (MYCOSTATIN) 100000 UNIT/ML suspension Use as directed 5 mLs (500,000 Units total) in the mouth or throat 4 (four) times daily. 473 mL 1  . potassium chloride SA (K-DUR,KLOR-CON) 20 MEQ tablet Take 20 mEq by mouth.     . predniSONE (DELTASONE) 5 MG tablet Take 5 mg by mouth daily with breakfast.    . sertraline (ZOLOFT) 100 MG tablet Take 1 tablet (100 mg total) by mouth daily. 90 tablet 3  . SODIUM BICARBONATE PO Take by mouth. 10 grain--taking 2 tab PO BID    . tacrolimus (PROGRAF) 1 MG capsule Take 2 mg by mouth daily. 2 mg po q am and 1 mg po q HS    . budesonide-formoterol (SYMBICORT) 80-4.5 MCG/ACT inhaler  Inhale 2 puffs into the lungs 2 (two) times daily. (Patient not taking: Reported on 05/29/2019) 1 Inhaler 2  . calcitRIOL (ROCALTROL) 0.25 MCG capsule Take by mouth daily.    . clopidogrel (PLAVIX) 75 MG tablet Take 75 mg by mouth daily.    . SYMBICORT 160-4.5 MCG/ACT inhaler TAKE 2 PUFFS BY MOUTH TWICE A DAY (Patient not taking: Reported on 05/29/2019) 30.6 Inhaler 1   No facility-administered medications prior to visit.     ROS See HPI  Objective:  BP (!) 150/80   Pulse 71   Temp (!) 96.2 F (35.7 C) (Tympanic)   Ht 5\' 5"  (1.651 m)   Wt 154 lb 9.6 oz (70.1 kg)   SpO2 99%   BMI 25.73 kg/m   BP Readings from Last 3 Encounters:  05/29/19 (!) 150/80  04/22/19 140/80  12/29/18 (!) 144/82    Wt Readings from Last 3 Encounters:  05/29/19 154 lb 9.6 oz (70.1 kg)  04/22/19 152 lb 3.2 oz (69 kg)  12/29/18 151 lb (68.5 kg)    Physical Exam  Psychiatric:        Attention and Perception: Attention normal.        Mood and Affect: Affect is flat.        Speech: Speech normal.        Behavior: Behavior is cooperative.        Cognition and Memory: Cognition and memory normal.    Lab Results  Component Value Date   WBC 10.9 01/28/2019   HGB 10.8 (A) 01/28/2019   HCT 33 (A) 01/28/2019   PLT 300 01/28/2019   GLUCOSE 89 02/05/2018   CHOL  11/16/2008    184        ATP III CLASSIFICATION:  <200     mg/dL   Desirable  200-239  mg/dL   Borderline High  >=240    mg/dL   High          TRIG 157 (H) 09/15/2011   HDL 33 (L) 11/16/2008   LDLCALC (H) 11/16/2008    130        Total Cholesterol/HDL:CHD Risk Coronary Heart Disease Risk Table                     Men   Women  1/2 Average Risk   3.4   3.3  Average Risk       5.0   4.4  2 X Average Risk   9.6   7.1  3 X Average Risk  23.4   11.0        Use the calculated Patient Ratio above and the CHD Risk Table to determine the patient's CHD Risk.        ATP III CLASSIFICATION (LDL):  <100     mg/dL   Optimal  100-129  mg/dL   Near or Above                    Optimal  130-159  mg/dL   Borderline  160-189  mg/dL   High  >190     mg/dL   Very High   ALT 13 01/28/2019   AST 19 01/28/2019   NA 139 01/28/2019   K 3.8 01/28/2019   CL 99 02/05/2018   CREATININE 1.9 (A) 01/28/2019   BUN 27 (A) 01/28/2019   CO2 24 02/05/2018   TSH 2.655 09/11/2011   INR 1.90 11/03/2017   HGBA1C  11/16/2008    5.5 (NOTE)  The ADA recommends the following therapeutic goal for glycemic control related to Hgb A1c measurement: Goal of therapy: <6.5 Hgb A1c  Reference: American Diabetes Association: Clinical Practice Recommendations 2010, Diabetes Care, 2010, 33: (Suppl  1).    Ct Chest High Resolution  Result Date: 01/12/2019 CLINICAL DATA:  Interstitial lung disease, chronic shortness of breath. EXAM: CT CHEST WITHOUT CONTRAST TECHNIQUE: Multidetector CT imaging of the chest was performed  following the standard protocol without intravenous contrast. High resolution imaging of the lungs, as well as inspiratory and expiratory imaging, was performed. COMPARISON:  12/05/2017. FINDINGS: Cardiovascular: Atherosclerotic calcification of the aorta. Pulmonary arteries and heart are enlarged. No pericardial effusion. Mediastinum/Nodes: No pathologically enlarged mediastinal or axillary lymph nodes. Incidental note is made of prominent vascularity in the left axilla. Hilar regions are difficult to evaluate without IV contrast. Esophagus is grossly unremarkable. Lungs/Pleura: Mosaic pulmonary parenchymal attenuation. Image quality is degraded by respiratory motion. No definitive subpleural reticulation, traction bronchiectasis/bronchiolectasis, architectural distortion or honeycombing. No pleural fluid. Airway is unremarkable. There is air trapping. Upper Abdomen: Visualized portion of the liver is unremarkable. There may be slight nodularity in the right adrenal gland. 2.3 cm low-attenuation lesion in the left kidney is partially imaged. Visualized portions of the spleen and pancreas are grossly unremarkable. Laparoscopic band is seen around the proximal stomach. Musculoskeletal: Degenerative changes in the spine. No worrisome lytic or sclerotic lesions. L1 compression fracture is likely chronic. Flowing anterior osteophytosis in the thoracic spine. IMPRESSION: 1. Assessment for interstitial lung disease is limited by respiratory motion. No definitive evidence of fibrotic interstitial lung disease. 2. 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 3.  Aortic atherosclerosis (ICD10-170.0). 4. Enlarged pulmonary arteries, indicative of pulmonary arterial hypertension. Electronically Signed   By: Lorin Picket M.D.   On: 01/12/2019 13:07    Assessment & Plan:   Lakysha was seen today for follow-up.  Diagnoses and all orders for this visit:  Current severe episode of major depressive disorder without  psychotic features without prior episode ([place])   I am having Mariann Laster T. Simonian maintain her docusate sodium, aspirin EC, mycophenolate, predniSONE, acetaminophen, furosemide, potassium chloride SA, Magnesium, clopidogrel, tacrolimus, budesonide-formoterol, Symbicort, calcitRIOL, albuterol, Ferrous Sulfate (IRON PO), SODIUM BICARBONATE PO, sertraline, budesonide-formoterol, and nystatin.  No orders of the defined types were placed in this encounter.   Problem List Items Addressed This Visit      Other   Depression - Primary       Follow-up: Return in about 3 months (around 08/29/2019) for depression (41mins).  Wilfred Lacy, NP

## 2019-06-16 DIAGNOSIS — N184 Chronic kidney disease, stage 4 (severe): Secondary | ICD-10-CM | POA: Diagnosis not present

## 2019-06-22 ENCOUNTER — Ambulatory Visit (INDEPENDENT_AMBULATORY_CARE_PROVIDER_SITE_OTHER): Payer: Medicare Other | Admitting: Psychology

## 2019-06-22 DIAGNOSIS — F4322 Adjustment disorder with anxiety: Secondary | ICD-10-CM | POA: Diagnosis not present

## 2019-07-14 ENCOUNTER — Ambulatory Visit (INDEPENDENT_AMBULATORY_CARE_PROVIDER_SITE_OTHER): Payer: Medicare Other | Admitting: Psychology

## 2019-07-14 DIAGNOSIS — F4322 Adjustment disorder with anxiety: Secondary | ICD-10-CM

## 2019-07-21 DIAGNOSIS — Z95818 Presence of other cardiac implants and grafts: Secondary | ICD-10-CM | POA: Diagnosis not present

## 2019-07-21 DIAGNOSIS — Z940 Kidney transplant status: Secondary | ICD-10-CM | POA: Diagnosis not present

## 2019-07-21 DIAGNOSIS — Z4822 Encounter for aftercare following kidney transplant: Secondary | ICD-10-CM | POA: Diagnosis not present

## 2019-07-21 DIAGNOSIS — Z950 Presence of cardiac pacemaker: Secondary | ICD-10-CM | POA: Diagnosis not present

## 2019-07-21 DIAGNOSIS — Z131 Encounter for screening for diabetes mellitus: Secondary | ICD-10-CM | POA: Diagnosis not present

## 2019-07-21 DIAGNOSIS — Z792 Long term (current) use of antibiotics: Secondary | ICD-10-CM | POA: Diagnosis not present

## 2019-07-21 DIAGNOSIS — Z7952 Long term (current) use of systemic steroids: Secondary | ICD-10-CM | POA: Diagnosis not present

## 2019-07-21 DIAGNOSIS — R638 Other symptoms and signs concerning food and fluid intake: Secondary | ICD-10-CM | POA: Diagnosis not present

## 2019-07-21 DIAGNOSIS — I10 Essential (primary) hypertension: Secondary | ICD-10-CM | POA: Diagnosis not present

## 2019-07-21 DIAGNOSIS — I4819 Other persistent atrial fibrillation: Secondary | ICD-10-CM | POA: Diagnosis not present

## 2019-07-21 DIAGNOSIS — Z13220 Encounter for screening for lipoid disorders: Secondary | ICD-10-CM | POA: Diagnosis not present

## 2019-07-21 DIAGNOSIS — Z79899 Other long term (current) drug therapy: Secondary | ICD-10-CM | POA: Diagnosis not present

## 2019-07-21 DIAGNOSIS — R0609 Other forms of dyspnea: Secondary | ICD-10-CM | POA: Diagnosis not present

## 2019-07-21 DIAGNOSIS — R600 Localized edema: Secondary | ICD-10-CM | POA: Diagnosis not present

## 2019-07-28 ENCOUNTER — Ambulatory Visit: Payer: Medicare Other | Admitting: Psychology

## 2019-07-29 ENCOUNTER — Ambulatory Visit (INDEPENDENT_AMBULATORY_CARE_PROVIDER_SITE_OTHER): Payer: Medicare Other | Admitting: Psychology

## 2019-07-29 DIAGNOSIS — F4322 Adjustment disorder with anxiety: Secondary | ICD-10-CM | POA: Diagnosis not present

## 2019-08-04 DIAGNOSIS — Z95818 Presence of other cardiac implants and grafts: Secondary | ICD-10-CM | POA: Diagnosis not present

## 2019-08-04 DIAGNOSIS — I4891 Unspecified atrial fibrillation: Secondary | ICD-10-CM | POA: Diagnosis not present

## 2019-08-04 DIAGNOSIS — Z45018 Encounter for adjustment and management of other part of cardiac pacemaker: Secondary | ICD-10-CM | POA: Diagnosis not present

## 2019-08-04 DIAGNOSIS — I4811 Longstanding persistent atrial fibrillation: Secondary | ICD-10-CM | POA: Diagnosis not present

## 2019-08-10 ENCOUNTER — Ambulatory Visit (INDEPENDENT_AMBULATORY_CARE_PROVIDER_SITE_OTHER): Payer: Medicare Other | Admitting: Psychology

## 2019-08-10 DIAGNOSIS — F4322 Adjustment disorder with anxiety: Secondary | ICD-10-CM | POA: Diagnosis not present

## 2019-08-12 DIAGNOSIS — R809 Proteinuria, unspecified: Secondary | ICD-10-CM | POA: Diagnosis not present

## 2019-08-12 DIAGNOSIS — N189 Chronic kidney disease, unspecified: Secondary | ICD-10-CM | POA: Diagnosis not present

## 2019-08-12 DIAGNOSIS — Z79899 Other long term (current) drug therapy: Secondary | ICD-10-CM | POA: Diagnosis not present

## 2019-08-12 DIAGNOSIS — N2589 Other disorders resulting from impaired renal tubular function: Secondary | ICD-10-CM | POA: Diagnosis not present

## 2019-08-12 DIAGNOSIS — M109 Gout, unspecified: Secondary | ICD-10-CM | POA: Diagnosis not present

## 2019-08-12 DIAGNOSIS — I770 Arteriovenous fistula, acquired: Secondary | ICD-10-CM | POA: Diagnosis not present

## 2019-08-12 DIAGNOSIS — R319 Hematuria, unspecified: Secondary | ICD-10-CM | POA: Diagnosis not present

## 2019-08-12 DIAGNOSIS — Z940 Kidney transplant status: Secondary | ICD-10-CM | POA: Diagnosis not present

## 2019-08-12 DIAGNOSIS — I129 Hypertensive chronic kidney disease with stage 1 through stage 4 chronic kidney disease, or unspecified chronic kidney disease: Secondary | ICD-10-CM | POA: Diagnosis not present

## 2019-08-12 DIAGNOSIS — N184 Chronic kidney disease, stage 4 (severe): Secondary | ICD-10-CM | POA: Diagnosis not present

## 2019-08-12 DIAGNOSIS — D631 Anemia in chronic kidney disease: Secondary | ICD-10-CM | POA: Diagnosis not present

## 2019-08-12 DIAGNOSIS — N2581 Secondary hyperparathyroidism of renal origin: Secondary | ICD-10-CM | POA: Diagnosis not present

## 2019-08-24 ENCOUNTER — Ambulatory Visit (INDEPENDENT_AMBULATORY_CARE_PROVIDER_SITE_OTHER): Payer: Medicare Other | Admitting: Psychology

## 2019-08-24 DIAGNOSIS — F4322 Adjustment disorder with anxiety: Secondary | ICD-10-CM | POA: Diagnosis not present

## 2019-08-25 DIAGNOSIS — I129 Hypertensive chronic kidney disease with stage 1 through stage 4 chronic kidney disease, or unspecified chronic kidney disease: Secondary | ICD-10-CM | POA: Diagnosis not present

## 2019-08-28 DIAGNOSIS — Z940 Kidney transplant status: Secondary | ICD-10-CM | POA: Diagnosis not present

## 2019-09-01 ENCOUNTER — Telehealth: Payer: Self-pay | Admitting: Nurse Practitioner

## 2019-09-01 NOTE — Telephone Encounter (Signed)
Jenny Reichmann is calling and wanted to see if Baldo Ash received a fax for patient. CB is [phone] ext [phone]

## 2019-09-02 NOTE — Telephone Encounter (Signed)
Spoke with Jenny Reichmann, he stated he going to fax over paper work for the provider to sign request by the pt and medicare cover it.   Waiting for the fax--gave [phone].

## 2019-09-02 NOTE — Telephone Encounter (Signed)
LVM for the pt to call back, need to confirm this request.

## 2019-09-02 NOTE — Telephone Encounter (Signed)
Pt call back stated she never gave informations or request any test done. To discard this request from them.

## 2019-09-07 ENCOUNTER — Ambulatory Visit (INDEPENDENT_AMBULATORY_CARE_PROVIDER_SITE_OTHER): Payer: Medicare Other | Admitting: Psychology

## 2019-09-07 DIAGNOSIS — F4322 Adjustment disorder with anxiety: Secondary | ICD-10-CM

## 2019-09-10 DIAGNOSIS — I129 Hypertensive chronic kidney disease with stage 1 through stage 4 chronic kidney disease, or unspecified chronic kidney disease: Secondary | ICD-10-CM | POA: Diagnosis not present

## 2019-09-16 ENCOUNTER — Other Ambulatory Visit: Payer: Self-pay | Admitting: Adult Health

## 2019-09-16 DIAGNOSIS — J45901 Unspecified asthma with (acute) exacerbation: Secondary | ICD-10-CM

## 2019-09-17 DIAGNOSIS — H16141 Punctate keratitis, right eye: Secondary | ICD-10-CM | POA: Diagnosis not present

## 2019-09-17 DIAGNOSIS — H05243 Constant exophthalmos, bilateral: Secondary | ICD-10-CM | POA: Diagnosis not present

## 2019-09-21 ENCOUNTER — Ambulatory Visit (INDEPENDENT_AMBULATORY_CARE_PROVIDER_SITE_OTHER): Payer: Medicare Other | Admitting: Psychology

## 2019-09-21 ENCOUNTER — Telehealth: Payer: Self-pay | Admitting: Primary Care

## 2019-09-21 DIAGNOSIS — F4322 Adjustment disorder with anxiety: Secondary | ICD-10-CM | POA: Diagnosis not present

## 2019-09-21 NOTE — Telephone Encounter (Signed)
Alternatives include Advair, Stan Head  -Please have her find out which are one is covered by her insurance

## 2019-09-21 NOTE — Telephone Encounter (Signed)
LMTCB to tell her the alternatives so she can check with ins

## 2019-09-21 NOTE — Telephone Encounter (Signed)
Called and spoke with pt. Pt is requesting to have something else prescribed in place of Symbicort as the cost for the Symbicort is $480. Dr. Elsworth Soho, please advise on this. Thanks!

## 2019-09-22 NOTE — Telephone Encounter (Signed)
Called pt but unable to reach. Left message for pt to return call. 

## 2019-09-22 NOTE — Telephone Encounter (Signed)
Breo 100 once daily - rinse mouth after use x 5 refills

## 2019-09-22 NOTE — Telephone Encounter (Signed)
Patient is returning phone call.  Patient phone number is [phone].

## 2019-09-22 NOTE — Telephone Encounter (Signed)
Spoke with pt. States that her insurance will cover Advair and Breo. Pt would prefer Breo since it's only used once daily.  Dr. Elsworth Soho - please advise. Thanks.

## 2019-09-22 NOTE — Telephone Encounter (Signed)
Pt returning a phone call. Pt can be reached at [phone].

## 2019-09-22 NOTE — Telephone Encounter (Signed)
Patient is returning phone call. Patient phone number is [phone].

## 2019-09-22 NOTE — Telephone Encounter (Signed)
LMTCB x1 for pt.  

## 2019-09-22 NOTE — Telephone Encounter (Signed)
Called and spoke to pt. Informed her of the recs per Dr. Elsworth Soho. She states she will call insurance and call us back. Will await call.

## 2019-09-23 MED ORDER — BREO ELLIPTA 100-25 MCG/INH IN AEPB: 1.0000 | INHALATION_SPRAY | Freq: Every day | RESPIRATORY_TRACT | 5 refills | Status: DC

## 2019-09-23 NOTE — Telephone Encounter (Signed)
Called and spoke to pt. Informed her of the recs per Dr. Elsworth Soho. Rx sent to preferred pharmacy and advised her to have the pharmacist show pt how to use inhaler. Pt aware to rinse mouth our after use. Nothing further needed at this time.

## 2019-09-29 DIAGNOSIS — E876 Hypokalemia: Secondary | ICD-10-CM | POA: Diagnosis not present

## 2019-10-05 ENCOUNTER — Ambulatory Visit: Payer: Medicare Other | Admitting: Psychology

## 2019-10-06 ENCOUNTER — Ambulatory Visit (INDEPENDENT_AMBULATORY_CARE_PROVIDER_SITE_OTHER): Payer: Medicare Other | Admitting: Psychology

## 2019-10-06 DIAGNOSIS — F4322 Adjustment disorder with anxiety: Secondary | ICD-10-CM

## 2019-10-08 ENCOUNTER — Telehealth: Payer: Self-pay | Admitting: Primary Care

## 2019-10-08 ENCOUNTER — Telehealth: Payer: Self-pay | Admitting: Nurse Practitioner

## 2019-10-08 DIAGNOSIS — J4531 Mild persistent asthma with (acute) exacerbation: Secondary | ICD-10-CM

## 2019-10-08 MED ORDER — ALBUTEROL SULFATE (2.5 MG/3ML) 0.083% IN NEBU: 2.5000 mg | INHALATION_SOLUTION | Freq: Four times a day (QID) | RESPIRATORY_TRACT | 0 refills | Status: DC | PRN

## 2019-10-08 NOTE — Telephone Encounter (Signed)
LVM for the pt to call back.

## 2019-10-08 NOTE — Telephone Encounter (Signed)
I do not think it is appropriate to prescribe ventolin inhaler when she is already using a nebulizer 4-6x/day.

## 2019-10-08 NOTE — Telephone Encounter (Signed)
Pt request new rx sent in for Albuterol ventolin inhaler send into [facility]. Pt stated she used this PRN during a day since she can only use Breo one time a day and Nebulizer every 4-6 hrs.   I stated her pulmonary stated she need to contact her PCP.   Please advise

## 2019-10-08 NOTE — Telephone Encounter (Signed)
Patient is calling and requesting a call back regarding medication. CB is [phone]

## 2019-10-08 NOTE — Telephone Encounter (Signed)
Returned call to patient made aware Ventolin not listed as current med on her list. She is on Albuterol neb solution in which she is requesting refill. Also made aware she past due for appt with RA. Recall placed due to only 2 days which are booked for April Nothing further needed.

## 2019-10-09 NOTE — Telephone Encounter (Signed)
Pt is aware.  

## 2019-10-12 ENCOUNTER — Telehealth: Payer: Self-pay | Admitting: Nurse Practitioner

## 2019-10-12 NOTE — Progress Notes (Signed)
  Chronic Care Management   Outreach Note  10/12/2019 Name: MIRISSA LOPRESTI MRN: [id number] DOB: Dec 08, 1945  Referred by: [name], NP Reason for referral : No chief complaint on file.   An unsuccessful telephone outreach was attempted today. The patient was referred to the pharmacist for assistance with care management and care coordination.   Follow Up Plan:   Earney [place] Upstream Scheduler

## 2019-10-19 ENCOUNTER — Ambulatory Visit: Payer: Medicare Other | Admitting: Psychology

## 2019-10-19 ENCOUNTER — Telehealth: Payer: Self-pay | Admitting: Nurse Practitioner

## 2019-10-19 NOTE — Telephone Encounter (Signed)
Error

## 2019-10-20 ENCOUNTER — Ambulatory Visit (INDEPENDENT_AMBULATORY_CARE_PROVIDER_SITE_OTHER): Payer: Medicare Other | Admitting: Pulmonary Disease

## 2019-10-20 ENCOUNTER — Encounter: Payer: Self-pay | Admitting: Pulmonary Disease

## 2019-10-20 ENCOUNTER — Other Ambulatory Visit: Payer: Self-pay

## 2019-10-20 ENCOUNTER — Telehealth: Payer: Self-pay | Admitting: Pulmonary Disease

## 2019-10-20 ENCOUNTER — Telehealth: Payer: Self-pay | Admitting: Nurse Practitioner

## 2019-10-20 DIAGNOSIS — J4531 Mild persistent asthma with (acute) exacerbation: Secondary | ICD-10-CM

## 2019-10-20 NOTE — Progress Notes (Signed)
  Chronic Care Management   Outreach Note  10/20/2019 Name: [name] MRN: [id number] DOB: 11-15-1945  Referred by: [name], NP Reason for referral : No chief complaint on file.   An unsuccessful telephone outreach was attempted today. The patient was referred to the pharmacist for assistance with care management and care coordination.   Follow Up Plan:   Earney Hamburg Upstream Scheduler

## 2019-10-20 NOTE — Progress Notes (Signed)
Virtual Visit via Telephone Note  I was unable to connect with the patient.  4 attempts were made.  3 voicemails were left.   Location: Patient: Home Provider: [facility] - [address], [place], [place], [place] [address]   I discussed the limitations, risks, security and privacy concerns of performing an evaluation and management service by telephone and the availability of in person appointments. I also discussed with the patient that there may be a patient responsible charge related to this service. The patient expressed understanding and agreed to proceed.  Patient consented to consult via telephone: Yes People present and their role in pt care: Pt     History of Present Illness:  73 year old female never smoker followed in our office for asthma  Past medical history: Morbid obesity, depression, obstructive sleep apnea, history of Pseudomonas infection, hypertension, end-stage renal disease, eczema Smoking history: Never smoker Maintenance: Patient of:  Chief complaint:    74 year old female never smoker followed in our office for asthma, patient last completed follow-up with our office in June/2020.  She is followed by Dr. Elsworth Soho.  At that time patient was encouraged to remain on Symbicort 80.  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  She was also requested to have follow-up with Dr. Elsworth Soho in 4 to 6 months.  Multiple attempts were made to reach the patient.  Attempts were made at these times listed below: 3 voicemails were left.  4/6 1114 4/6 1118 4/6 1120 4/6 1335  Observations/Objective:  01/12/2019-CT chest high-res-assessment for ILD is limited by respiratory motion, no definitive evidence of fibrotic interstitial lung disease, air trapping indicative of small airways disease, aortic arthrosclerosis, enlarged pulmonary arteries  02/04/2018-echocardiogram-LV ejection fraction 25 to 42%, grade 2 diastolic  dysfunction, pulmonary artery systolic pressure 58  3/53/6144-[id number] function test-FVC 1.33 (53% duty), postbronchodilator ratio 79, postbronchodilator FEV1 1.00 (51% predicted), DLCO 11.25 (43% predicted)  Social History   Tobacco Use  Smoking Status Never Smoker  Smokeless Tobacco Never Used   Immunization History  Administered Date(s) Administered  . Fluad Quad(high Dose 65+) 04/28/2019  . Influenza Split 03/27/2012  . Influenza Whole 04/29/2008  . Influenza, High Dose Seasonal PF 04/19/2017, 03/16/2018  . Influenza,inj,Quad PF,6+ Mos 04/24/2013  . Influenza-Unspecified 04/29/2014, 03/17/2015, 04/18/2016  . Pneumococcal Conjugate-13 03/02/2014  . Pneumococcal Polysaccharide-23 04/25/2005, 09/18/2016  . Tdap 05/08/2013      Assessment and Plan:  No problem-specific Assessment & Plan notes found for this encounter.   Follow Up Instructions:  No follow-ups on file.   I discussed the assessment and treatment plan with the patient. The patient was provided an opportunity to ask questions and all were answered. The patient agreed with the plan and demonstrated an understanding of the instructions.   The patient was advised to call back or seek an in-person evaluation if the symptoms worsen or if the condition fails to improve as anticipated.  I provided 0 minutes of non-face-to-face time during this encounter.   Lauraine Rinne, NP

## 2019-10-20 NOTE — Patient Instructions (Signed)
We attempted to reach you for your televisit that we had scheduled.  I made 4 attempts.  I left you 3 voicemails on your cell phone.  Please give our office a call so we can get you rescheduled for an appointment.  If your symptoms worsen please present to an urgent care or emergency room for further evaluation.  Wyn Quaker, FNP

## 2019-10-20 NOTE — Telephone Encounter (Signed)
10/20/2019  I have made 4 attempts to reach the patient.  All of them have been unsuccessful.  3 voicemails have been left.  Can we leave this message in triage so the patient can be attempted on 10/21/2019 to try to be rescheduled.  If the patient is reached please also verify the contact information and preferred method of contact.  Patient is also due for follow-up with Dr. Elsworth Soho.  Wyn Quaker, FNP

## 2019-10-21 DIAGNOSIS — Z79899 Other long term (current) drug therapy: Secondary | ICD-10-CM | POA: Diagnosis not present

## 2019-10-21 DIAGNOSIS — N184 Chronic kidney disease, stage 4 (severe): Secondary | ICD-10-CM | POA: Diagnosis not present

## 2019-10-21 DIAGNOSIS — R809 Proteinuria, unspecified: Secondary | ICD-10-CM | POA: Diagnosis not present

## 2019-10-21 DIAGNOSIS — N2589 Other disorders resulting from impaired renal tubular function: Secondary | ICD-10-CM | POA: Diagnosis not present

## 2019-10-21 DIAGNOSIS — D849 Immunodeficiency, unspecified: Secondary | ICD-10-CM | POA: Diagnosis not present

## 2019-10-21 DIAGNOSIS — Z940 Kidney transplant status: Secondary | ICD-10-CM | POA: Diagnosis not present

## 2019-10-21 DIAGNOSIS — D631 Anemia in chronic kidney disease: Secondary | ICD-10-CM | POA: Diagnosis not present

## 2019-10-21 DIAGNOSIS — M109 Gout, unspecified: Secondary | ICD-10-CM | POA: Diagnosis not present

## 2019-10-21 DIAGNOSIS — R319 Hematuria, unspecified: Secondary | ICD-10-CM | POA: Diagnosis not present

## 2019-10-21 DIAGNOSIS — I129 Hypertensive chronic kidney disease with stage 1 through stage 4 chronic kidney disease, or unspecified chronic kidney disease: Secondary | ICD-10-CM | POA: Diagnosis not present

## 2019-10-21 DIAGNOSIS — N2581 Secondary hyperparathyroidism of renal origin: Secondary | ICD-10-CM | POA: Diagnosis not present

## 2019-10-21 NOTE — Telephone Encounter (Addendum)
Called and spoke with pt in regards to message from [place]. Pt stated that her phone had been messing up and she also thought the phone visit was supposed to have been tomorrow 4/8. I stated to pt that we could schedule her for an appt with Dr. Elsworth Soho Friday 4/9 as he had an opening and she stated that would be fine. Pt has been scheduled for a televisit with Dr. Elsworth Soho 4/9 at 9:30. Nothing further needed.

## 2019-10-22 ENCOUNTER — Encounter: Payer: Self-pay | Admitting: Nurse Practitioner

## 2019-10-22 ENCOUNTER — Other Ambulatory Visit: Payer: Self-pay

## 2019-10-22 ENCOUNTER — Encounter (HOSPITAL_BASED_OUTPATIENT_CLINIC_OR_DEPARTMENT_OTHER): Payer: Self-pay | Admitting: *Deleted

## 2019-10-22 ENCOUNTER — Emergency Department (HOSPITAL_BASED_OUTPATIENT_CLINIC_OR_DEPARTMENT_OTHER): Payer: Medicare Other

## 2019-10-22 ENCOUNTER — Telehealth (INDEPENDENT_AMBULATORY_CARE_PROVIDER_SITE_OTHER): Payer: Medicare Other | Admitting: Nurse Practitioner

## 2019-10-22 ENCOUNTER — Inpatient Hospital Stay (HOSPITAL_BASED_OUTPATIENT_CLINIC_OR_DEPARTMENT_OTHER)
Admission: EM | Admit: 2019-10-22 | Discharge: 2019-10-25 | DRG: 291 | Disposition: A | Discharge status: Home Health | Payer: Medicare Other | Source: Ambulatory Visit | Attending: Internal Medicine | Admitting: Internal Medicine

## 2019-10-22 VITALS — BP 130/88 | Ht 65.0 in | Wt 150.4 lb

## 2019-10-22 DIAGNOSIS — R05 Cough: Secondary | ICD-10-CM

## 2019-10-22 DIAGNOSIS — J209 Acute bronchitis, unspecified: Secondary | ICD-10-CM | POA: Diagnosis not present

## 2019-10-22 DIAGNOSIS — Z8261 Family history of arthritis: Secondary | ICD-10-CM

## 2019-10-22 DIAGNOSIS — I739 Peripheral vascular disease, unspecified: Secondary | ICD-10-CM | POA: Diagnosis not present

## 2019-10-22 DIAGNOSIS — Z8673 Personal history of transient ischemic attack (TIA), and cerebral infarction without residual deficits: Secondary | ICD-10-CM

## 2019-10-22 DIAGNOSIS — R0602 Shortness of breath: Secondary | ICD-10-CM

## 2019-10-22 DIAGNOSIS — Z79899 Other long term (current) drug therapy: Secondary | ICD-10-CM

## 2019-10-22 DIAGNOSIS — D631 Anemia in chronic kidney disease: Secondary | ICD-10-CM | POA: Diagnosis present

## 2019-10-22 DIAGNOSIS — Z950 Presence of cardiac pacemaker: Secondary | ICD-10-CM | POA: Diagnosis not present

## 2019-10-22 DIAGNOSIS — J45901 Unspecified asthma with (acute) exacerbation: Secondary | ICD-10-CM | POA: Diagnosis not present

## 2019-10-22 DIAGNOSIS — R059 Cough, unspecified: Secondary | ICD-10-CM

## 2019-10-22 DIAGNOSIS — J4531 Mild persistent asthma with (acute) exacerbation: Secondary | ICD-10-CM

## 2019-10-22 DIAGNOSIS — I639 Cerebral infarction, unspecified: Secondary | ICD-10-CM | POA: Diagnosis present

## 2019-10-22 DIAGNOSIS — I420 Dilated cardiomyopathy: Secondary | ICD-10-CM | POA: Diagnosis not present

## 2019-10-22 DIAGNOSIS — J9601 Acute respiratory failure with hypoxia: Secondary | ICD-10-CM | POA: Diagnosis not present

## 2019-10-22 DIAGNOSIS — K5900 Constipation, unspecified: Secondary | ICD-10-CM | POA: Diagnosis present

## 2019-10-22 DIAGNOSIS — F329 Major depressive disorder, single episode, unspecified: Secondary | ICD-10-CM | POA: Diagnosis present

## 2019-10-22 DIAGNOSIS — D649 Anemia, unspecified: Secondary | ICD-10-CM | POA: Diagnosis present

## 2019-10-22 DIAGNOSIS — Z96641 Presence of right artificial hip joint: Secondary | ICD-10-CM | POA: Diagnosis not present

## 2019-10-22 DIAGNOSIS — Z7952 Long term (current) use of systemic steroids: Secondary | ICD-10-CM

## 2019-10-22 DIAGNOSIS — Z7951 Long term (current) use of inhaled steroids: Secondary | ICD-10-CM

## 2019-10-22 DIAGNOSIS — Z940 Kidney transplant status: Secondary | ICD-10-CM | POA: Diagnosis not present

## 2019-10-22 DIAGNOSIS — Z8249 Family history of ischemic heart disease and other diseases of the circulatory system: Secondary | ICD-10-CM | POA: Diagnosis not present

## 2019-10-22 DIAGNOSIS — Z8701 Personal history of pneumonia (recurrent): Secondary | ICD-10-CM | POA: Diagnosis not present

## 2019-10-22 DIAGNOSIS — J441 Chronic obstructive pulmonary disease with (acute) exacerbation: Secondary | ICD-10-CM

## 2019-10-22 DIAGNOSIS — I132 Hypertensive heart and chronic kidney disease with heart failure and with stage 5 chronic kidney disease, or end stage renal disease: Secondary | ICD-10-CM | POA: Diagnosis not present

## 2019-10-22 DIAGNOSIS — I5043 Acute on chronic combined systolic (congestive) and diastolic (congestive) heart failure: Secondary | ICD-10-CM | POA: Diagnosis not present

## 2019-10-22 DIAGNOSIS — Z833 Family history of diabetes mellitus: Secondary | ICD-10-CM | POA: Diagnosis not present

## 2019-10-22 DIAGNOSIS — I10 Essential (primary) hypertension: Secondary | ICD-10-CM | POA: Diagnosis present

## 2019-10-22 DIAGNOSIS — I4819 Other persistent atrial fibrillation: Secondary | ICD-10-CM | POA: Diagnosis present

## 2019-10-22 DIAGNOSIS — Z807 Family history of other malignant neoplasms of lymphoid, hematopoietic and related tissues: Secondary | ICD-10-CM

## 2019-10-22 DIAGNOSIS — Z20822 Contact with and (suspected) exposure to covid-19: Secondary | ICD-10-CM | POA: Diagnosis present

## 2019-10-22 DIAGNOSIS — J440 Chronic obstructive pulmonary disease with acute lower respiratory infection: Secondary | ICD-10-CM | POA: Diagnosis present

## 2019-10-22 DIAGNOSIS — D849 Immunodeficiency, unspecified: Secondary | ICD-10-CM | POA: Diagnosis present

## 2019-10-22 DIAGNOSIS — R0902 Hypoxemia: Secondary | ICD-10-CM

## 2019-10-22 DIAGNOSIS — Z7982 Long term (current) use of aspirin: Secondary | ICD-10-CM

## 2019-10-22 DIAGNOSIS — I509 Heart failure, unspecified: Secondary | ICD-10-CM | POA: Diagnosis present

## 2019-10-22 DIAGNOSIS — Z86718 Personal history of other venous thrombosis and embolism: Secondary | ICD-10-CM | POA: Diagnosis not present

## 2019-10-22 LAB — CBC WITH DIFFERENTIAL/PLATELET
Abs Immature Granulocytes: 0.07 10*3/uL (ref 0.00–0.07)
Basophils Absolute: 0.1 10*3/uL (ref 0.0–0.1)
Basophils Relative: 1 %
Eosinophils Absolute: 0.1 10*3/uL (ref 0.0–0.5)
Eosinophils Relative: 1 %
HCT: 35.5 % — ABNORMAL LOW (ref 36.0–46.0)
Hemoglobin: 10.9 g/dL — ABNORMAL LOW (ref 12.0–15.0)
Immature Granulocytes: 1 %
Lymphocytes Relative: 7 %
Lymphs Abs: 0.9 10*3/uL (ref 0.7–4.0)
MCH: 25.6 pg — ABNORMAL LOW (ref 26.0–34.0)
MCHC: 30.7 g/dL (ref 30.0–36.0)
MCV: 83.3 fL (ref 80.0–100.0)
Monocytes Absolute: 1.3 10*3/uL — ABNORMAL HIGH (ref 0.1–1.0)
Monocytes Relative: 10 %
Neutro Abs: 10.3 10*3/uL — ABNORMAL HIGH (ref 1.7–7.7)
Neutrophils Relative %: 80 %
Platelets: 218 10*3/uL (ref 150–400)
RBC: 4.26 MIL/uL (ref 3.87–5.11)
RDW: 17.7 % — ABNORMAL HIGH (ref 11.5–15.5)
WBC: 12.7 10*3/uL — ABNORMAL HIGH (ref 4.0–10.5)
nRBC: 0 % (ref 0.0–0.2)

## 2019-10-22 LAB — COMPREHENSIVE METABOLIC PANEL
ALT: 15 U/L (ref 0–44)
AST: 25 U/L (ref 15–41)
Albumin: 4 g/dL (ref 3.5–5.0)
Alkaline Phosphatase: 104 U/L (ref 38–126)
Anion gap: 13 (ref 5–15)
BUN: 44 mg/dL — ABNORMAL HIGH (ref 8–23)
CO2: 32 mmol/L (ref 22–32)
Calcium: 9.7 mg/dL (ref 8.9–10.3)
Chloride: 96 mmol/L — ABNORMAL LOW (ref 98–111)
Creatinine, Ser: 2.3 mg/dL — ABNORMAL HIGH (ref 0.44–1.00)
GFR calc Af Amer: 24 mL/min — ABNORMAL LOW (ref >60)
GFR calc non Af Amer: 21 mL/min — ABNORMAL LOW (ref >60)
Glucose, Bld: 108 mg/dL — ABNORMAL HIGH (ref 70–99)
Potassium: 3.8 mmol/L (ref 3.5–5.1)
Sodium: 141 mmol/L (ref 135–145)
Total Bilirubin: 1.5 mg/dL — ABNORMAL HIGH (ref 0.3–1.2)
Total Protein: 7.7 g/dL (ref 6.5–8.1)

## 2019-10-22 LAB — PHOSPHORUS: Phosphorus: 2.6 mg/dL (ref 2.5–4.6)

## 2019-10-22 LAB — MAGNESIUM: Magnesium: 1.7 mg/dL (ref 1.7–2.4)

## 2019-10-22 LAB — BRAIN NATRIURETIC PEPTIDE: B Natriuretic Peptide: 970.7 pg/mL — ABNORMAL HIGH (ref 0.0–100.0)

## 2019-10-22 LAB — SARS CORONAVIRUS 2 (TAT 6-24 HRS): SARS Coronavirus 2: NEGATIVE

## 2019-10-22 IMAGING — DX DG CHEST 1V PORT
1 series · 1 of 1 positions shown · non-contrast
Comparison: [DATE]

CLINICAL DATA: Shortness of breath, cough

EXAM:
PORTABLE CHEST 1 VIEW

[chest ap]
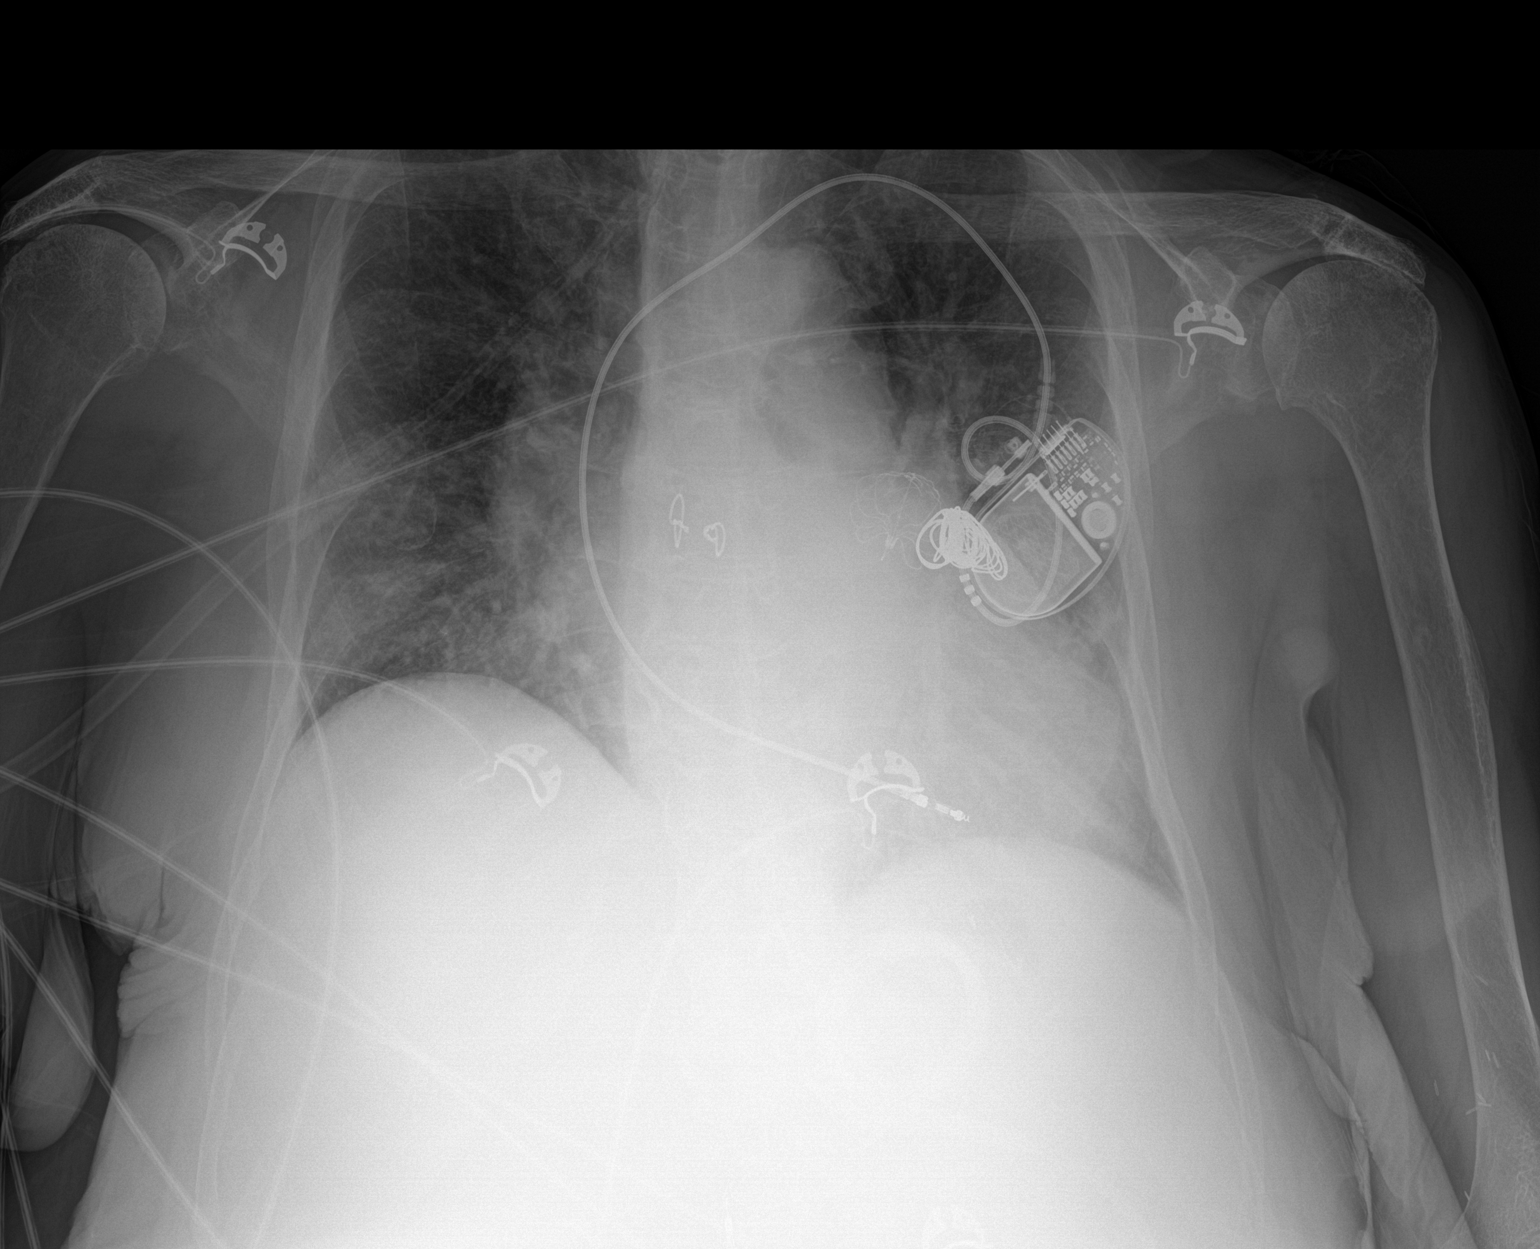

[1 of 1 positions shown; findings below may reference images not displayed]

FINDINGS: Cardiomegaly with left chest single lead pacer defibrillator and
Watchman left atrial appendage occlusion device. Mild, diffuse
interstitial pulmonary opacity. The visualized skeletal structures
are unremarkable.
IMPRESSION: Cardiomegaly with mild, diffuse interstitial pulmonary opacity,
likely edema.

## 2019-10-22 MED ORDER — ALBUTEROL SULFATE (2.5 MG/3ML) 0.083% IN NEBU
5.0000 mg | INHALATION_SOLUTION | Freq: Once | RESPIRATORY_TRACT | Status: AC
  Administered 2019-10-22: 5 mg via RESPIRATORY_TRACT
  Filled 2019-10-22: qty 6

## 2019-10-22 MED ORDER — MYCOPHENOLATE SODIUM 180 MG PO TBEC
180.0000 mg | DELAYED_RELEASE_TABLET | Freq: Two times a day (BID) | ORAL | Status: DC
  Administered 2019-10-22 – 2019-10-25 (×6): 180 mg via ORAL
  Filled 2019-10-22 (×7): qty 1

## 2019-10-22 MED ORDER — BENZONATATE 100 MG PO CAPS
200.0000 mg | ORAL_CAPSULE | Freq: Three times a day (TID) | ORAL | Status: DC | PRN
  Administered 2019-10-23 – 2019-10-24 (×3): 200 mg via ORAL
  Filled 2019-10-22 (×3): qty 2

## 2019-10-22 MED ORDER — SERTRALINE HCL 100 MG PO TABS
100.0000 mg | ORAL_TABLET | Freq: Every day | ORAL | Status: DC
  Administered 2019-10-23 – 2019-10-25 (×3): 100 mg via ORAL
  Filled 2019-10-22 (×3): qty 1

## 2019-10-22 MED ORDER — FUROSEMIDE 10 MG/ML IJ SOLN
60.0000 mg | Freq: Two times a day (BID) | INTRAMUSCULAR | Status: DC
  Administered 2019-10-22 – 2019-10-25 (×6): 60 mg via INTRAVENOUS
  Filled 2019-10-22 (×6): qty 6

## 2019-10-22 MED ORDER — FLUTICASONE FUROATE-VILANTEROL 100-25 MCG/INH IN AEPB
1.0000 | INHALATION_SPRAY | Freq: Every day | RESPIRATORY_TRACT | Status: DC
  Administered 2019-10-23 – 2019-10-24 (×2): 1 via RESPIRATORY_TRACT
  Filled 2019-10-22 (×2): qty 28

## 2019-10-22 MED ORDER — SODIUM CHLORIDE 0.9 % IV SOLN
500.0000 mg | INTRAVENOUS | Status: DC
  Administered 2019-10-22 – 2019-10-24 (×3): 500 mg via INTRAVENOUS
  Filled 2019-10-22 (×3): qty 500

## 2019-10-22 MED ORDER — METHYLPREDNISOLONE SODIUM SUCC 40 MG IJ SOLR
40.0000 mg | Freq: Three times a day (TID) | INTRAMUSCULAR | Status: DC
  Administered 2019-10-22 – 2019-10-25 (×8): 40 mg via INTRAVENOUS
  Filled 2019-10-22 (×8): qty 1

## 2019-10-22 MED ORDER — MAGNESIUM SULFATE 50 % IJ SOLN
1.0000 g | Freq: Once | INTRAMUSCULAR | Status: AC
  Administered 2019-10-22: 1 g via INTRAVENOUS
  Filled 2019-10-22: qty 2

## 2019-10-22 MED ORDER — CARVEDILOL 12.5 MG PO TABS
12.5000 mg | ORAL_TABLET | Freq: Two times a day (BID) | ORAL | Status: DC
  Administered 2019-10-22 – 2019-10-25 (×6): 12.5 mg via ORAL
  Filled 2019-10-22 (×6): qty 1

## 2019-10-22 MED ORDER — TACROLIMUS 1 MG PO CAPS
1.0000 mg | ORAL_CAPSULE | Freq: Every day | ORAL | Status: DC
  Administered 2019-10-22 – 2019-10-24 (×3): 1 mg via ORAL
  Filled 2019-10-22 (×4): qty 1

## 2019-10-22 MED ORDER — ASPIRIN EC 81 MG PO TBEC
81.0000 mg | DELAYED_RELEASE_TABLET | Freq: Every day | ORAL | Status: DC
  Administered 2019-10-23 – 2019-10-25 (×3): 81 mg via ORAL
  Filled 2019-10-22 (×3): qty 1

## 2019-10-22 MED ORDER — FUROSEMIDE 10 MG/ML IJ SOLN
80.0000 mg | Freq: Once | INTRAMUSCULAR | Status: AC
  Administered 2019-10-22: 80 mg via INTRAVENOUS
  Filled 2019-10-22: qty 8

## 2019-10-22 MED ORDER — HEPARIN SODIUM (PORCINE) 5000 UNIT/ML IJ SOLN
5000.0000 [IU] | Freq: Three times a day (TID) | INTRAMUSCULAR | Status: DC
  Administered 2019-10-22 – 2019-10-25 (×8): 5000 [IU] via SUBCUTANEOUS
  Filled 2019-10-22 (×8): qty 1

## 2019-10-22 MED ORDER — ALBUTEROL SULFATE (2.5 MG/3ML) 0.083% IN NEBU
2.5000 mg | INHALATION_SOLUTION | Freq: Four times a day (QID) | RESPIRATORY_TRACT | Status: DC | PRN
  Administered 2019-10-23 – 2019-10-24 (×4): 2.5 mg via RESPIRATORY_TRACT
  Filled 2019-10-22 (×4): qty 3

## 2019-10-22 MED ORDER — TACROLIMUS 1 MG PO CAPS
2.0000 mg | ORAL_CAPSULE | Freq: Every day | ORAL | Status: DC
  Administered 2019-10-23 – 2019-10-25 (×3): 2 mg via ORAL
  Filled 2019-10-22 (×3): qty 2

## 2019-10-22 MED ORDER — POTASSIUM CHLORIDE CRYS ER 20 MEQ PO TBCR
20.0000 meq | EXTENDED_RELEASE_TABLET | Freq: Once | ORAL | Status: AC
  Administered 2019-10-22: 20 meq via ORAL
  Filled 2019-10-22: qty 1

## 2019-10-22 MED ORDER — TACROLIMUS 1 MG PO CAPS: 2.0000 mg | ORAL_CAPSULE | Freq: Every day | ORAL | Status: DC

## 2019-10-22 MED ORDER — SODIUM BICARBONATE 650 MG PO TABS
650.0000 mg | ORAL_TABLET | Freq: Two times a day (BID) | ORAL | Status: DC
  Administered 2019-10-22 – 2019-10-25 (×6): 650 mg via ORAL
  Filled 2019-10-22 (×6): qty 1

## 2019-10-22 MED ORDER — IPRATROPIUM BROMIDE 0.02 % IN SOLN
0.5000 mg | Freq: Once | RESPIRATORY_TRACT | Status: AC
  Administered 2019-10-22: 0.5 mg via RESPIRATORY_TRACT
  Filled 2019-10-22: qty 2.5

## 2019-10-22 MED ORDER — DOCUSATE SODIUM 100 MG PO CAPS: 100.0000 mg | ORAL_CAPSULE | Freq: Every day | ORAL | Status: DC | PRN

## 2019-10-22 MED ORDER — METHYLPREDNISOLONE SODIUM SUCC 125 MG IJ SOLR
125.0000 mg | Freq: Once | INTRAMUSCULAR | Status: AC
  Administered 2019-10-22: 125 mg via INTRAVENOUS
  Filled 2019-10-22: qty 2

## 2019-10-22 NOTE — H&P (Signed)
History and Physical    [name] CHE:[id number] DOB: 20-May-1946 DOA: 10/22/2019  PCP: [name], NP Patient coming from: Home she is transferred here from [place].  She lives at home with her husband.  She walks with a cane. Chief Complaint: Shortness of breath and cough for the last 2 to 3 weeks approximately.  HPI: KNOX HOLDMAN is a 74 y.o. female with medical history significant of systolic heart failure, history of stroke without residual deficits, paroxysmal atrial fibrillation, hypertension, history of renal transplant, nonischemic dilated cardiomyopathy, asthma admitted with progressively worsening shortness of breath and cough. She denied any fever or chills, she denied nausea vomiting or diarrhea.  However she reported having diarrhea a few weeks ago which has been resolved.  She did not have Covid.  She got both shots of Covid vaccine. She denies urinary complaints or abdominal pain.  She denies chest pain or palpitations.  She denies loss of consciousness. History of asthma she is on inhalers and nebulizers at home which did not help her at this time with her breathing.  She sees Dr. Elsworth Soho and is due to have an appointment with him tomorrow morning. Review of her records show that her echo in 2019 with ejection fraction 25%. She denies any weight gain in fact she reports losing weight due to decreased appetite and not eating.    ED Course: She was given Solu-Medrol and Lasix 80 mg daily. Her BNP was 970, chest x-ray consistent with mild congestive heart failure, sodium 141 potassium 3.8 BUN 44 creatinine 2.30 white count 12.7 hemoglobin 10.9 normal platelets. Covid negative. Review of Systems: As per HPI otherwise all other systems reviewed and are negative  Ambulatory Status walks with a cane Past Medical History:  Diagnosis Date  . Acute diastolic (congestive) heart failure ([place]) 08/12/2017  . AKI (acute kidney injury) ([place]) 11/04/2017  . Anemia     . Anxiety   . Arthritis    "shoulders; arms; hips" (02/04/2018)  . Asthma   . Atrial fibrillation ([place])   . Atrial flutter [facility])    s/p aflutter ablation at [facility]  . Benign hypertension with ESRD (end-stage renal disease) ([place])   . Blood transfusion    never had a reaction to blood transfusion  . Cardiomyopathy Mar 2013   Mild, EF 50-55% by Mar 2013 ECHO, diast dysfxn II  . Cardiomyopathy ([place])   . CHF (congestive heart failure) ([place]) 2005  . CHF (congestive heart failure) ([place])   . Childhood asthma   . Closed fracture of right distal radius 10/18/2016  . CMV (cytomegalovirus infection) ([place]) 10/03/2015  . Constipation    takes Miralax daily  . Constipation   . Depression    takes Zoloft daily  . Distal radius fracture, right   . DVT of lower extremity, bilateral ([place]) 12/21/11   "they're there now; been there for 2 wks"  . Eczema   . ESRD (end stage renal disease) on dialysis [facility]) 09/2011   07-19-2015 had Kidney transplant at [facility]; "don't get dialysis anymore" (02/04/2018)  . Fractures, stress    in both feet--6 OR 7 YRS AGO--RESOLVED  . Generalized edema [id number]  . Gout    doesn't require meds   . HCAP (healthcare-associated pneumonia) 09/10/2011  . Hearing difficulty   . High cholesterol   . History of CVA (cerebrovascular accident) without residual deficits   . History of hip replacement, total, right   . History of pneumonia   .  Hx of Clostridium difficile infection   . Hypercalcemia    09/10/11  . Hyperparathyroidism ([place]) 04/07/2013  . Hypertension    takes Diltiazem daily   . Hypomagnesemia 08/02/2015  . Hypophosphatemia 11/08/2017  . ICB (intracranial bleed) ([place])   . Immunosuppression ([place]) 07/25/2015  . Memory changes   . Morbid obesity ([place])   . Nonischemic dilated cardiomyopathy ([place])   . Obesity 04/07/2013  . Oligouria   . OSA on CPAP   . PAF (paroxysmal atrial fibrillation) (HCC)     HX OF CEREBRAL BLEED WHILE ON COUMADIN-SO PT NOT ON ANY BLOOD  THINNERS NOW  . Peripheral vascular disease ([place])   . Presence of Watchman left atrial appendage closure device 10/04/2017  . Proteinuria 09/04/2016  . Psoriasis 08/07/2016  . 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 S/P insertion of IVC (inferior vena caval) filter   . Sepsis ([place]) 11/08/2017  . Stress fracture    bilateral feet  . Stroke [facility]) 2009   denies residual on 02/04/2018;  hemorrhagic now off coumadin  . Stroke due to intracerebral hemorrhage ([place]) 2009  . Suture reaction 09/04/2016  . Transfusion history   . Use of cane as ambulatory aid     Past Surgical History:  Procedure Laterality Date  . AV FISTULA PLACEMENT  09/28/2011   Procedure: ARTERIOVENOUS (AV) FISTULA CREATION;  Surgeon: Rosetta Posner, MD;  Location: [facility];  Service: Vascular;  Laterality: Left;  . CARDIAC CATHETERIZATION    . CARDIAC VALVE SURGERY  1960    FOR ATRIAL SEPTAL DEFECT  . CHOLECYSTECTOMY     1980's  . COLONOSCOPY    . CYSTOSCOPY     many yrs ago  . FEMUR FRACTURE SURGERY Right 03/2011; 09/03/2011   "had 2, 2 wk apart in 2012; broke it again 08/2011 & had OR"  . FRACTURE SURGERY    . I & D EXTREMITY  09/15/2011   Procedure: IRRIGATION AND DEBRIDEMENT EXTREMITY w REMOVAL OF HARDWARE;  Surgeon: [name], MD;  Location: [place];  Service: Orthopedics;  Laterality: Right;  . INCISION AND DRAINAGE HIP  12/21/2011   Procedure: IRRIGATION AND DEBRIDEMENT HIP;  Surgeon: [name], MD;  Location: [place];  Service: Orthopedics;  Laterality: Right;  I&D RIGHT HIP WITH PLACEMENT ANTIBIOTIC SPACER  . INSERTION OF DIALYSIS CATHETER     Procedure: INSERTION OF DIALYSIS CATHETER;  Surgeon: Rosetta Posner, MD;  Location: [facility];  Service: Vascular;  Laterality: Right;  . IRRIGATION AND DEBRIDEMENT ABSCESS  12/21/11   right hip  . JOINT REPLACEMENT    . KIDNEY TRANSPLANT  07/19/15  . LAPAROSCOPIC GASTRIC BANDING  2008  . LEFT ATRIAL APPENDAGE OCCLUSION  10/04/2017  . OPEN REDUCTION INTERNAL FIXATION (ORIF)  DISTAL RADIAL FRACTURE Right 10/18/2016   Procedure: OPEN REDUCTION INTERNAL FIXATION (ORIF) RIGHT DISTAL RADIAL FRACTURE;  Surgeon: Marchia Bond, MD;  Location: [facility];  Service: Orthopedics;  Laterality: Right;  . TOTAL HIP ARTHROPLASTY Right 02/2011   right THA 02/2011, I&D/removal of hardware 09/2011,, repeat I&D Jun 2013, reimplantation R THA 03-26-2012  . TOTAL HIP REVISION  03/25/2012   Procedure: TOTAL HIP REVISION;  Surgeon: [name], MD;  Location: [facility];  Service: Orthopedics;  Laterality: Right;  Right Total Hip Reimplantation  . TUBAL LIGATION    . VENA CAVA FILTER PLACEMENT  11/2011    Social History   Socioeconomic History  . Marital status: Married    Spouse name: Not on file  .  Number of children: Not on file  . Years of education: Not on file  . Highest education level: Not on file  Occupational History  . Occupation: retired  Tobacco Use  . Smoking status: Never Smoker  . Smokeless tobacco: Never Used  Substance and Sexual Activity  . Alcohol use: Never  . Drug use: Never  . Sexual activity: Yes    Birth control/protection: Post-menopausal  Other Topics Concern  . Not on file  Social History Narrative   Married; retired; lives in [place] Strain:   . Difficulty of Paying Living Expenses:   Food Insecurity:   . Worried About Charity fundraiser in the Last Year:   . Arboriculturist in the Last Year:   Transportation Needs:   . Film/video editor (Medical):   Marland Kitchen Lack of Transportation (Non-Medical):   Physical Activity:   . Days of Exercise per Week:   . Minutes of Exercise per Session:   Stress:   . Feeling of Stress :   Social Connections:   . Frequency of Communication with Friends and Family:   . Frequency of Social Gatherings with Friends and Family:   . Attends Religious Services:   . Active Member of Clubs or Organizations:   . Attends Archivist  Meetings:   Marland Kitchen Marital Status:   Intimate Partner Violence:   . Fear of Current or Ex-Partner:   . Emotionally Abused:   Marland Kitchen Physically Abused:   . Sexually Abused:     Allergies  Allergen Reactions  . Ace Inhibitors Other (See Comments)    Reaction unknown  . 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 Warfarin Other (See Comments)    Left side brain hemorrhage  . Warfarin Sodium Other (See Comments)    Caused her to have a stroke    Family History  Problem Relation Age of Onset  . Cancer Father   . Diabetes Mother   . Hypertension Mother   . Arthritis Mother   . Hodgkin's lymphoma Other 32       dscd---HODGKINS DISEASE  . Hypertension Brother     Prior to Admission medications   Medication Sig Start Date End Date Taking? Authorizing Provider  albuterol (PROVENTIL) (2.5 MG/3ML) 0.083% nebulizer solution Take 3 mLs (2.5 mg total) by nebulization every 6 (six) hours as needed for wheezing or shortness of breath. Dx:J45.909 10/08/19  Yes [name], MD  aspirin EC 81 MG tablet Take 81 mg by mouth. 07/21/15  Yes [provider]  carvedilol (COREG) 12.5 MG tablet Take 12.5 mg by mouth 2 (two) times daily. 10/21/19  Yes [provider]  Ferrous Sulfate (IRON PO) Take by mouth.   Yes [provider]  fluticasone furoate-vilanterol (BREO ELLIPTA) 100-25 MCG/INH AEPB Inhale 1 puff into the lungs daily. 09/23/19  Yes [name], MD  furosemide (LASIX) 80 MG tablet Take 80 mg by mouth 2 (two) times daily.   Yes [provider]  Magnesium 400 MG TABS Take by mouth 2 (two) times daily.   Yes [provider]  mycophenolate (MYFORTIC) 180 MG EC tablet Take 180 mg by mouth 2 (two) times daily.   Yes [provider]  potassium chloride SA (K-DUR,KLOR-CON) 20 MEQ tablet Take 20 mEq by mouth.    Yes [provider]  predniSONE (DELTASONE) 5 MG  tablet Take 5 mg by mouth  daily with breakfast.   Yes [provider]  sertraline (ZOLOFT) 100 MG tablet Take 1 tablet (100 mg total) by mouth daily. 11/25/18  Yes [name], [name], NP  SODIUM BICARBONATE PO Take by mouth. 10 grain--taking 2 tab PO BID   Yes [provider]  tacrolimus (PROGRAF) 1 MG capsule Take 2 mg by mouth daily. 2 mg po q am and 1 mg po q HS   Yes [provider]  acetaminophen (TYLENOL) 500 MG tablet Take 500 mg by mouth every 6 (six) hours as needed.    [provider]  docusate sodium (COLACE) 100 MG capsule Take 100 mg by mouth daily as needed (For constipation.).     [provider]    Physical Exam: Vitals:   10/22/19 1131 10/22/19 1336 10/22/19 1638 10/22/19 1638  BP:  (!) 152/76 (!) 156/90 (!) 156/90  Pulse:  73 71 72  Resp:  (!) 25 20 20   Temp:   97.9 F (36.6 C) 97.9 F (36.6 C)  TempSrc:   Oral Oral  SpO2: 100% 100% 100% 100%  Weight:    69 kg  Height:    5\' 5"  (1.651 m)     . General: Appears calm and comfortable . Neck:no LAD, masses or thyromegaly . Cardiovascular:  RRR, no m/r/g. No LE edema.  Marland Kitchen Respiratory: Decreased breath sounds at the bases with crackles, not much wheezing bilaterally, no w/r/r. Normal respiratory effort. . Abdomen:  soft, ntnd, NABS . Skin:  no rash or induration seen on limited exam . Musculoskeletal: Trace bilateral lower extremity edema . Psychiatric:grossly normal mood and affect, speech fluent and appropriate, AOx3 . Neurologic:CN 2-12 grossly intact, moves all extremities in coordinated fashion, sensation intact  Labs on Admission: I have personally reviewed following labs and imaging studies  CBC: Recent Labs  Lab 10/22/19 1116  WBC 12.7*  NEUTROABS 10.3*  HGB 10.9*  HCT 35.5*  MCV 83.3  PLT 621   Basic Metabolic Panel: Recent Labs  Lab 10/22/19 1116 10/22/19 1325  NA 141  --   K 3.8  --   CL 96*  --   CO2 32  --   GLUCOSE 108*  --   BUN 44*  --   CREATININE 2.30*  --     CALCIUM 9.7  --   MG  --  1.7  PHOS  --  2.6   GFR: Estimated Creatinine Clearance: 21.9 mL/min (A) (by C-G formula based on SCr of 2.3 mg/dL (H)). Liver Function Tests: Recent Labs  Lab 10/22/19 1116  AST 25  ALT 15  ALKPHOS 104  BILITOT 1.5*  PROT 7.7  ALBUMIN 4.0   No results for input(s): LIPASE, AMYLASE in the last 168 hours. No results for input(s): AMMONIA in the last 168 hours. Coagulation Profile: No results for input(s): INR, PROTIME in the last 168 hours. Cardiac Enzymes: No results for input(s): CKTOTAL, CKMB, CKMBINDEX, TROPONINI in the last 168 hours. BNP (last 3 results) No results for input(s): PROBNP in the last 8760 hours. HbA1C: No results for input(s): HGBA1C in the last 72 hours. CBG: No results for input(s): GLUCAP in the last 168 hours. Lipid Profile: No results for input(s): CHOL, HDL, LDLCALC, TRIG, CHOLHDL, LDLDIRECT in the last 72 hours. Thyroid Function Tests: No results for input(s): TSH, T4TOTAL, FREET4, T3FREE, THYROIDAB in the last 72 hours. Anemia Panel: No results for input(s): VITAMINB12, FOLATE, FERRITIN, TIBC, IRON, RETICCTPCT in the last  72 hours. Urine analysis:    Component Value Date/Time   COLORURINE AMBER (A) 02/04/2018 1540   APPEARANCEUR CLOUDY (A) 02/04/2018 1540   LABSPEC 1.017 02/04/2018 1540   PHURINE 5.0 02/04/2018 1540   GLUCOSEU NEGATIVE 02/04/2018 1540   HGBUR LARGE (A) 02/04/2018 1540   HGBUR negative 10/04/2009 1044   BILIRUBINUR NEGATIVE 02/04/2018 1540   BILIRUBINUR negative 12/13/2017 1508   KETONESUR NEGATIVE 02/04/2018 1540   PROTEINUR 100 (A) 02/04/2018 1540   UROBILINOGEN 0.2 12/13/2017 1508   UROBILINOGEN 0.2 03/25/2012 1354   NITRITE NEGATIVE 02/04/2018 1540   LEUKOCYTESUR MODERATE (A) 02/04/2018 1540    Creatinine Clearance: Estimated Creatinine Clearance: 21.9 mL/min (A) (by C-G formula based on SCr of 2.3 mg/dL (H)).  Sepsis Labs: @LABRCNTIP (procalcitonin:4,lacticidven:4) )No results found  for this or any previous visit (from the past 240 hour(s)).   Radiological Exams on Admission: DG Chest Port 1 View  Result Date: 10/22/2019 CLINICAL DATA:  Shortness of breath, cough EXAM: PORTABLE CHEST 1 VIEW COMPARISON:  11/14/2018 FINDINGS: Cardiomegaly with left chest single lead pacer defibrillator and Watchman left atrial appendage occlusion device. Mild, diffuse interstitial pulmonary opacity. The visualized skeletal structures are unremarkable. IMPRESSION: Cardiomegaly with mild, diffuse interstitial pulmonary opacity, likely edema. Electronically Signed   By: Eddie Candle M.D.   On: 10/22/2019 11:50    EKG: Paced rhythm  Assessment/Plan Active Problems:   Acute on chronic combined systolic and diastolic CHF (congestive heart failure) (HCC)   #1 acute hypoxic respiratory failure-likely multifactorial, systolic CHF exacerbation, bronchitis, asthma exacerbation.  #2 bronchitis/asthma exacerbation-patient was diffusely wheezing in [facility] ER she received Solu-Medrol 125 mg.  By the time I saw her her wheezing has improved. I will treat her with Solu-Medrol continue nebulizer and inhaler Breo Ellipta. Start azithromycin.  Her cough sounds very wet and productive though its documented everywhere she has been having nonproductive cough.  Throughout the time that I was in the room she was having productive cough.  She does have a mild leukocytosis. Patient followed by Dr. Elsworth Soho as an outpatient.  #3 acute on chronic systolic CHF exacerbation-patient denies any dietary or medical noncompliance.  Lasix 60 mg IV twice a day. CHF pathway ordered. Last echo 01/2018-ejection fraction 25 to 30% with diffuse hypokinesis and septal dyskinesis. Repeat echo this admission.  #4 CKD stage III//renal transplant-continue mycophenolate and Prograf with sodium bicarbonate.  #5 anemia of chronic disease hemoglobin stable and at baseline  #6 history of essential hypertension on Coreg at home  continue  #7 history of stroke without residual effects on aspirin  #8 patient carries a history of paroxysmal atrial fibrillation and has pacemaker [place].  She follows up at [facility].  #9 constipation continue Colace  #10 depression on Zoloft  Estimated body mass index is 25.31 kg/m as calculated from the following:   Height as of this encounter: 5\' 5"  (1.651 m).   Weight as of this encounter: 69 kg.   DVT prophylaxis: Heparin Code Status: Full code Family Communication: Discussed with husband at the bedside Disposition Plan: Patient came from home Plan discharge to home likely PT consult is pending Barriers to discharge is hypoxia secondary to CHF/bronchitis/asthma Consults called: None Admission status: Inpatient   Georgette Shell MD [facility]  If 7PM-7AM, please contact night-coverage [email or web address] Password [facility]  10/22/2019, 5:29 PM

## 2019-10-22 NOTE — Progress Notes (Signed)
Virtual Visit via Video Note  I connected with@ on 10/22/19 at  8:30 AM EDT by a video enabled telemedicine application and verified that I am speaking with the correct person using two identifiers.  Location: Patient:Home Provider: Office Participants: patient, husband and provider  I discussed the limitations of evaluation and management by telemedicine and the availability of in person appointments. I also discussed with the patient that there may be a patient responsible charge related to this service. The patient expressed understanding and agreed to proceed.  CC:pt c/o coughing,weak,wheezing at times, diarrhea and not want to eat/going on 2 wks/took otc  History of Present Illness: Cough This is a new problem. The current episode started 1 to 4 weeks ago. The problem has been gradually worsening. The problem occurs constantly. The cough is non-productive. Associated symptoms include shortness of breath and wheezing. The symptoms are aggravated by lying down and exercise. She has tried a beta-agonist inhaler for the symptoms. The treatment provided no relief. Her past medical history is significant for asthma and COPD. CHF, CKD   Observations/Objective: Physical Exam  Constitutional: She is oriented to person, place, and time.  Pulmonary/Chest: She has rales.  Coarse wet cough during video appt  Neurological: She is alert and oriented to person, place, and time.   Assessment and Plan: Sherissa was seen today for cough.  Diagnoses and all orders for this visit:  SOB (shortness of breath)  Cough   Follow Up Instructions: See avs   I discussed the assessment and treatment plan with the patient. The patient was provided an opportunity to ask questions and all were answered. The patient agreed with the plan and demonstrated an understanding of the instructions.   The patient was advised to call back or seek an in-person evaluation if the symptoms worsen or if the condition fails  to improve as anticipated.  Wilfred Lacy, NP

## 2019-10-22 NOTE — Patient Instructions (Addendum)
Advised Mrs. Quebedeaux to go to ED for additional evaluation due to PND and SOB with minimal activity. Mr. Delange was present during his visit and they both agreed. Need to rule out CHF exacerbation vs COVID vs Asthma exacerbation?

## 2019-10-22 NOTE — ED Triage Notes (Signed)
Shortness of breath and coughing for 2 weeks.

## 2019-10-22 NOTE — ED Provider Notes (Signed)
[facility] EMERGENCY DEPARTMENT Provider Note   CSN: [id number] Arrival date & time: 10/22/19  1039     History Chief Complaint  Patient presents with  . Shortness of Breath  . Asthma    [name] is a 74 y.o. female.  Patient is a 74 year old female with numerous medical problems including end-stage renal disease status post renal transplant on immunosuppressive medications, cardiomyopathy, CHF, asthma, stroke who is presenting today from her doctor's office due to progressive shortness of breath.  Patient states approximately 2 weeks ago she started having nonproductive cough and shortness of breath with diffuse wheezing.  Initially she was only short of breath with exertion but it has progressed to being continuously short of breath.  She has had diarrhea for the last few weeks and reports she has no appetite.  She has not noticed any swelling in her lower extremities or weight gain.  She denies any fever.  She has been completely vaccinated against Covid and her second dose was approximately 3 weeks ago.  She denies any urinary symptoms, chest or abdominal pain.  She is still taking her antirejection medications.  She does report that using albuterol at home does help for a short time but then her symptoms quickly return.  She was noted to be hypoxic today upon arrival to her doctor's office and they recommended she come here.  The history is provided by the patient and the spouse.  Shortness of Breath      Past Medical History:  Diagnosis Date  . Acute diastolic (congestive) heart failure ([place]) 08/12/2017  . AKI (acute kidney injury) ([place]) 11/04/2017  . Anemia   . Anxiety   . Arthritis    "shoulders; arms; hips" (02/04/2018)  . Asthma   . Atrial fibrillation ([place])   . Atrial flutter [facility])    s/p aflutter ablation at [facility]  . Benign hypertension with ESRD (end-stage renal disease) ([place])   . Blood transfusion    never had a reaction to blood transfusion  .  Cardiomyopathy Mar 2013   Mild, EF 50-55% by Mar 2013 ECHO, diast dysfxn II  . Cardiomyopathy ([place])   . CHF (congestive heart failure) ([place]) 2005  . CHF (congestive heart failure) ([place])   . Childhood asthma   . Closed fracture of right distal radius 10/18/2016  . CMV (cytomegalovirus infection) ([place]) 10/03/2015  . Constipation    takes Miralax daily  . Constipation   . Depression    takes Zoloft daily  . Distal radius fracture, right   . DVT of lower extremity, bilateral ([place]) 12/21/11   "they're there now; been there for 2 wks"  . Eczema   . ESRD (end stage renal disease) on dialysis [facility]) 09/2011   07-19-2015 had Kidney transplant at [facility]; "don't get dialysis anymore" (02/04/2018)  . Fractures, stress    in both feet--6 OR 7 YRS AGO--RESOLVED  . Generalized edema [id number]  . Gout    doesn't require meds   . HCAP (healthcare-associated pneumonia) 09/10/2011  . Hearing difficulty   . High cholesterol   . History of CVA (cerebrovascular accident) without residual deficits   . History of hip replacement, total, right   . History of pneumonia   . Hx of Clostridium difficile infection   . Hypercalcemia    09/10/11  . Hyperparathyroidism ([place]) 04/07/2013  . Hypertension    takes Diltiazem daily   . Hypomagnesemia 08/02/2015  . Hypophosphatemia 11/08/2017  . ICB (intracranial bleed) ([place])   .  Immunosuppression ([place]) 07/25/2015  . Memory changes   . Morbid obesity ([place])   . Nonischemic dilated cardiomyopathy ([place])   . Obesity 04/07/2013  . Oligouria   . OSA on CPAP   . PAF (paroxysmal atrial fibrillation) ([facility])     HX OF CEREBRAL BLEED WHILE ON COUMADIN-SO PT NOT ON ANY BLOOD THINNERS NOW  . Peripheral vascular disease ([place])   . Presence of Watchman left atrial appendage closure device 10/04/2017  . Proteinuria 09/04/2016  . Psoriasis 08/07/2016  . 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 S/P insertion of IVC (inferior vena caval) filter   . Sepsis ([place]) 11/08/2017  . Stress  fracture    bilateral feet  . Stroke [facility]) 2009   denies residual on 02/04/2018;  hemorrhagic now off coumadin  . Stroke due to intracerebral hemorrhage ([place]) 2009  . Suture reaction 09/04/2016  . Transfusion history   . Use of cane as ambulatory aid     Patient Active Problem List   Diagnosis Date Noted  . S/P AV nodal ablation 10/08/2018  . Cardiac pacemaker in situ 04/18/2018  . History of amiodarone therapy 03/07/2018  . Oral mucositis 02/22/2018  . 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 Personal history of PE (pulmonary embolism) 02/22/2018  . Acute on chronic systolic heart failure ([place]) 02/11/2018  . Hyperkalemia 02/03/2018  . Atrial fibrillation with RVR ([place]) 02/03/2018  . Lower extremity edema 12/10/2017  . Acute kidney injury superimposed on chronic kidney disease ([place]) 11/17/2017  . Acute diastolic (congestive) heart failure ([place]) 11/17/2017  . UTI (urinary tract infection) 11/03/2017  . Diarrhea 11/03/2017  . Presence of Watchman left atrial appendage closure device 10/04/2017  . Chronic respiratory failure with hypoxia ([place]) 10/03/2017  . Persistent atrial fibrillation ([place]) 08/12/2017  . Dyspnea 07/26/2017  . Atrial flutter ([place])   . Peripheral vascular disease ([place])   . Oligouria   . Hypertension   . Fractures, stress   . Eczema   . Distal radius fracture, right   . Depression   . Constipation   . Arthritis   . Exacerbation of asthma 04/01/2017  . Closed fracture of right distal radius 10/18/2016  . Proteinuria 09/04/2016  . 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 Immunosuppression ([place]) 07/25/2015  . Nonischemic dilated cardiomyopathy ([place])   . Use of cane as ambulatory aid 04/07/2013  . Expected blood loss anemia 03/26/2012  . S/P right TH revision 03/25/2012  . DVT of lower extremity, bilateral ([place]) 12/21/2011  . Personal history of DVT (deep vein thrombosis) 12/08/2011  . Obstructive sleep apnea 12/08/2011  . End stage renal disease ([place])  11/06/2011  . 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 Pseudomonas aeruginosa infection 10/24/2011  . ESRD (end stage renal disease) on dialysis ([place]) 09/14/2011  . Hypercalcemia   . Gout 09/10/2011  . Absence of right hip joint with antibiotic pacer 09/03/2011  . Traumatic seroma of thigh ([place]) 06/11/2011  . Clostridium difficile colitis 06/09/2011  . Leukocytosis 06/08/2011  . Anemia 06/06/2011  . ARF (acute renal failure) ([place]) 06/06/2011  . SINUSITIS- ACUTE-NOS 09/27/2010  . DEPRESSION 11/25/2008  . PAF (paroxysmal atrial fibrillation) ([place]) 11/25/2008  . RENAL FAILURE, ACUTE 11/25/2008  . CVA 09/28/2008  . CVA (cerebrovascular accident due to intracerebral hemorrhage) ([place]) 09/28/2008  . WEAKNESS, LEFT SIDE OF BODY 09/21/2008  . Morbid obesity ([place]) 10/20/2007  . Psoriasis 10/20/2007  . Stroke due to intracerebral hemorrhage ([place]) 07/17/2007  . Stroke ([place]) 07/17/2007  . Essential hypertension 07/07/2007  .  Asthma 07/07/2007    Past Surgical History:  Procedure Laterality Date  . AV FISTULA PLACEMENT  09/28/2011   Procedure: ARTERIOVENOUS (AV) FISTULA CREATION;  Surgeon: Rosetta Posner, MD;  Location: [facility];  Service: Vascular;  Laterality: Left;  . CARDIAC CATHETERIZATION    . CARDIAC VALVE SURGERY  1960    FOR ATRIAL SEPTAL DEFECT  . CHOLECYSTECTOMY     1980's  . COLONOSCOPY    . CYSTOSCOPY     many yrs ago  . FEMUR FRACTURE SURGERY Right 03/2011; 09/03/2011   "had 2, 2 wk apart in 2012; broke it again 08/2011 & had OR"  . FRACTURE SURGERY    . I & D EXTREMITY  09/15/2011   Procedure: IRRIGATION AND DEBRIDEMENT EXTREMITY w REMOVAL OF HARDWARE;  Surgeon: [name], MD;  Location: [facility];  Service: Orthopedics;  Laterality: Right;  . INCISION AND DRAINAGE HIP  12/21/2011   Procedure: IRRIGATION AND DEBRIDEMENT HIP;  Surgeon: [name], MD;  Location: [facility];  Service: Orthopedics;  Laterality: Right;  I&D RIGHT HIP WITH PLACEMENT ANTIBIOTIC SPACER  . INSERTION OF  DIALYSIS CATHETER     Procedure: INSERTION OF DIALYSIS CATHETER;  Surgeon: Rosetta Posner, MD;  Location: [facility];  Service: Vascular;  Laterality: Right;  . IRRIGATION AND DEBRIDEMENT ABSCESS  12/21/11   right hip  . JOINT REPLACEMENT    . KIDNEY TRANSPLANT  07/19/15  . LAPAROSCOPIC GASTRIC BANDING  2008  . LEFT ATRIAL APPENDAGE OCCLUSION  10/04/2017  . OPEN REDUCTION INTERNAL FIXATION (ORIF) DISTAL RADIAL FRACTURE Right 10/18/2016   Procedure: OPEN REDUCTION INTERNAL FIXATION (ORIF) RIGHT DISTAL RADIAL FRACTURE;  Surgeon: Marchia Bond, MD;  Location: [facility];  Service: Orthopedics;  Laterality: Right;  . TOTAL HIP ARTHROPLASTY Right 02/2011   right THA 02/2011, I&D/removal of hardware 09/2011,, repeat I&D Jun 2013, reimplantation R THA 03-26-2012  . TOTAL HIP REVISION  03/25/2012   Procedure: TOTAL HIP REVISION;  Surgeon: [name], MD;  Location: [facility];  Service: Orthopedics;  Laterality: Right;  Right Total Hip Reimplantation  . TUBAL LIGATION    . VENA CAVA FILTER PLACEMENT  11/2011     OB History   No obstetric history on file.     Family History  Problem Relation Age of Onset  . Cancer Father   . Diabetes Mother   . Hypertension Mother   . Arthritis Mother   . Hodgkin's lymphoma Other 32       dscd---HODGKINS DISEASE  . Hypertension Brother     Social History   Tobacco Use  . Smoking status: Never Smoker  . Smokeless tobacco: Never Used  Substance Use Topics  . Alcohol use: Never  . Drug use: Never    Home Medications Prior to Admission medications   Medication Sig Start Date End Date Taking? Authorizing Provider  acetaminophen (TYLENOL) 500 MG tablet Take 500 mg by mouth every 6 (six) hours as needed.    [provider]  albuterol (PROVENTIL) (2.5 MG/3ML) 0.083% nebulizer solution Take 3 mLs (2.5 mg total) by nebulization every 6 (six) hours as needed for wheezing or shortness of breath. Dx:J45.909 10/08/19   [name], MD  aspirin EC 81  MG tablet Take 81 mg by mouth. 07/21/15   [provider]  carvedilol (COREG) 12.5 MG tablet Take 12.5 mg by mouth 2 (two) times daily. 10/21/19   [provider]  docusate sodium (COLACE) 100 MG capsule Take 100 mg  by mouth daily as needed (For constipation.).     [provider]  Ferrous Sulfate (IRON PO) Take by mouth.    [provider]  fluticasone furoate-vilanterol (BREO ELLIPTA) 100-25 MCG/INH AEPB Inhale 1 puff into the lungs daily. 09/23/19   [name], MD  furosemide (LASIX) 80 MG tablet Take 80 mg by mouth 2 (two) times daily.    [provider]  Magnesium 400 MG TABS Take by mouth 2 (two) times daily.    [provider]  mycophenolate (MYFORTIC) 180 MG EC tablet Take 180 mg by mouth 2 (two) times daily.    [provider]  nystatin (MYCOSTATIN) 100000 UNIT/ML suspension Use as directed 5 mLs (500,000 Units total) in the mouth or throat 4 (four) times daily. 04/29/19   [name], [name], NP  potassium chloride SA (K-DUR,KLOR-CON) 20 MEQ tablet Take 20 mEq by mouth.     [provider]  predniSONE (DELTASONE) 5 MG tablet Take 5 mg by mouth daily with breakfast.    [provider]  sertraline (ZOLOFT) 100 MG tablet Take 1 tablet (100 mg total) by mouth daily. 11/25/18   [name], [name], NP  SODIUM BICARBONATE PO Take by mouth. 10 grain--taking 2 tab PO BID    [provider]  SYMBICORT 160-4.5 MCG/ACT inhaler TAKE 2 PUFFS BY MOUTH TWICE A DAY Patient not taking: Reported on 10/22/2019 09/17/19   Parrett, Fonnie Mu, NP  tacrolimus (PROGRAF) 1 MG capsule Take 2 mg by mouth daily. 2 mg po q am and 1 mg po q HS    [provider]    Allergies    Ace inhibitors, Amiodarone, Warfarin, and Warfarin sodium  Review of Systems   Review of Systems  Respiratory: Positive for shortness of breath.   All other systems reviewed and are negative.   Physical Exam Updated Vital Signs There were no  vitals taken for this visit.  Physical Exam Vitals and nursing note reviewed.  Constitutional:      Appearance: She is well-developed and normal weight.  HENT:     Head: Normocephalic and atraumatic.  Eyes:     Pupils: Pupils are equal, round, and reactive to light.  Neck:     Vascular: JVD present.  Cardiovascular:     Rate and Rhythm: Normal rate and regular rhythm.     Heart sounds: Normal heart sounds. No murmur. No friction rub.  Pulmonary:     Effort: Tachypnea and accessory muscle usage present.     Breath sounds: Wheezing present. No rales.  Abdominal:     General: Bowel sounds are normal. There is no distension.     Palpations: Abdomen is soft.     Tenderness: There is no abdominal tenderness. There is no guarding or rebound.  Musculoskeletal:        General: No tenderness. Normal range of motion.     Right lower leg: No tenderness. No edema.     Left lower leg: No tenderness. No edema.     Comments: No edema  Skin:    General: Skin is warm and dry.     Findings: No rash.  Neurological:     Mental Status: She is alert and oriented to person, place, and time.     Cranial Nerves: No cranial nerve deficit.  Psychiatric:        Behavior: Behavior normal.     ED Results / Procedures / Treatments   Labs (all labs ordered are listed, but  only abnormal results are displayed) Labs Reviewed  CBC WITH DIFFERENTIAL/PLATELET - Abnormal; Notable for the following components:      Result Value   WBC 12.7 (*)    Hemoglobin 10.9 (*)    HCT 35.5 (*)    MCH 25.6 (*)    RDW 17.7 (*)    Neutro Abs 10.3 (*)    Monocytes Absolute 1.3 (*)    All other components within normal limits  COMPREHENSIVE METABOLIC PANEL - Abnormal; Notable for the following components:   Chloride 96 (*)    Glucose, Bld 108 (*)    BUN 44 (*)    Creatinine, Ser 2.30 (*)    Total Bilirubin 1.5 (*)    GFR calc non Af Amer 21 (*)    GFR calc Af Amer 24 (*)    All other components within normal limits   BRAIN NATRIURETIC PEPTIDE - Abnormal; Notable for the following components:   B Natriuretic Peptide 970.7 (*)    All other components within normal limits  SARS CORONAVIRUS 2 (TAT 6-24 HRS)    EKG None  ED ECG REPORT   Date: 10/22/2019  Rate: 72  Rhythm: normal sinus rhythm  QRS Axis: normal  Intervals: normal  ST/T Wave abnormalities: nonspecific ST/T changes  Conduction Disutrbances:nonspecific intraventricular conduction delay  Narrative Interpretation:   Old EKG Reviewed: unchanged and Compared to last EKG which was in 2019 patient was having V. tach but when looking to more distant EKGs today's EKG is not significantly changed  I have personally reviewed the EKG tracing and agree with the computerized printout as noted.  Radiology DG Chest Port 1 View  Result Date: 10/22/2019 CLINICAL DATA:  Shortness of breath, cough EXAM: PORTABLE CHEST 1 VIEW COMPARISON:  11/14/2018 FINDINGS: Cardiomegaly with left chest single lead pacer defibrillator and Watchman left atrial appendage occlusion device. Mild, diffuse interstitial pulmonary opacity. The visualized skeletal structures are unremarkable. IMPRESSION: Cardiomegaly with mild, diffuse interstitial pulmonary opacity, likely edema. Electronically Signed   By: Eddie Candle M.D.   On: 10/22/2019 11:50    Procedures Procedures (including critical care time)  Medications Ordered in ED Medications  methylPREDNISolone sodium succinate (SOLU-MEDROL) 125 mg/2 mL injection 125 mg (has no administration in time range)  magnesium sulfate (IV Push/IM) injection 1 g (has no administration in time range)  albuterol (PROVENTIL) (2.5 MG/3ML) 0.083% nebulizer solution 5 mg (has no administration in time range)  ipratropium (ATROVENT) nebulizer solution 0.5 mg (has no administration in time range)    ED Course  I have reviewed the triage vital signs and the nursing notes.  Pertinent labs & imaging results that were available during my care of  the patient were reviewed by me and considered in my medical decision making (see chart for details).    MDM Rules/Calculators/A&P                      Patient with numerous medical problems presenting today with shortness of breath and nonproductive cough.  Patient does have a history of asthma and is wheezing diffusely this could be asthma exacerbation versus pneumonia as she is immunocompromised versus CHF.   Possibility for PE but patient has not recently been immobile or have unilateral leg pain or swelling.  Patient has received complete vaccination against Covid so lower suspicion that these are the cause of her symptoms.  She has not been around anybody else who is recently been ill.  She has oxygen saturation of 88% today  on room air that intermittently fluctuates up to 91%.  Diffuse wheezing and accessory muscle use on exam but patient is able to speak in full sentences.  EKG shows a sinus rhythm with a wide complex which is not significantly changed from prior.  Labs, chest x-ray are pending.  Patient given Solu-Medrol, magnesium, albuterol and Atrovent.  12:12 PM CBC with stable hemoglobin of 10 and mild leukocytosis of 12,000 with normal platelet count, CMP with creatinine of 2.3 which seems to be patient's baseline with normal electrolytes, BNP elevated at 970 which prior results were in the 400s.  Chest x-ray shows cardiomegaly with mild diffuse interstitial pulmonary opacities which is likely edema.  Patient denies a productive cough or fever and has had Covid vaccine with lower suspicion for pneumonia at this time.  Minimal improvement with wheezing after albuterol and Atrovent.  Patient is requiring nasal cannula oxygen to maintain oxygen saturations greater than 90%.  Will give an IV dose of 80 mg of Lasix for concern for fluid overload.  Will admit for further care as patient may have combined asthma exacerbation and CHF.  Most recent echo in 2019 showed an EF of 25 to 30%.  CRITICAL  CARE Performed by: Afrah Burlison Total critical care time: 30 minutes Critical care time was exclusive of separately billable procedures and treating other patients. Critical care was necessary to treat or prevent imminent or life-threatening deterioration. Critical care was time spent personally by me on the following activities: development of treatment plan with patient and/or surrogate as well as nursing, discussions with consultants, evaluation of patient's response to treatment, examination of patient, obtaining history from patient or surrogate, ordering and performing treatments and interventions, ordering and review of laboratory studies, ordering and review of radiographic studies, pulse oximetry and re-evaluation of patient's condition.  MDM Number of Diagnoses or Management Options Acute on chronic combined systolic and diastolic congestive heart failure ([place]): established, worsening COPD exacerbation ([place]): established, worsening   Amount and/or Complexity of Data Reviewed Clinical lab tests: ordered and reviewed Tests in the radiology section of CPT: ordered and reviewed Tests in the medicine section of CPT: ordered and reviewed Discussion of test results with the performing providers: yes Decide to obtain previous medical records or to obtain history from someone other than the patient: yes Obtain history from someone other than the patient: yes Review and summarize past medical records: yes Discuss the patient with other providers: yes Independent visualization of images, tracings, or specimens: yes  Risk of Complications, Morbidity, and/or Mortality Presenting problems: moderate Diagnostic procedures: minimal Management options: low  Critical Care Total time providing critical care: 30-74 minutes  Patient Progress Patient progress: improved     Final Clinical Impression(s) / ED Diagnoses Final diagnoses:  COPD exacerbation ([place])  Acute on chronic combined  systolic and diastolic congestive heart failure ([place])    Rx / DC Orders ED Discharge Orders    None       Blanchie Dessert, MD 10/22/19 1235

## 2019-10-23 ENCOUNTER — Ambulatory Visit: Payer: Medicare Other | Admitting: Pulmonary Disease

## 2019-10-23 ENCOUNTER — Inpatient Hospital Stay (HOSPITAL_COMMUNITY): Payer: Medicare Other

## 2019-10-23 ENCOUNTER — Observation Stay (HOSPITAL_COMMUNITY): Payer: Medicare Other

## 2019-10-23 DIAGNOSIS — R0902 Hypoxemia: Secondary | ICD-10-CM | POA: Diagnosis not present

## 2019-10-23 DIAGNOSIS — Z8701 Personal history of pneumonia (recurrent): Secondary | ICD-10-CM | POA: Diagnosis not present

## 2019-10-23 DIAGNOSIS — J209 Acute bronchitis, unspecified: Secondary | ICD-10-CM | POA: Diagnosis present

## 2019-10-23 DIAGNOSIS — D631 Anemia in chronic kidney disease: Secondary | ICD-10-CM | POA: Diagnosis present

## 2019-10-23 DIAGNOSIS — I361 Nonrheumatic tricuspid (valve) insufficiency: Secondary | ICD-10-CM

## 2019-10-23 DIAGNOSIS — Z96641 Presence of right artificial hip joint: Secondary | ICD-10-CM | POA: Diagnosis present

## 2019-10-23 DIAGNOSIS — K5900 Constipation, unspecified: Secondary | ICD-10-CM | POA: Diagnosis present

## 2019-10-23 DIAGNOSIS — I509 Heart failure, unspecified: Secondary | ICD-10-CM | POA: Insufficient documentation

## 2019-10-23 DIAGNOSIS — I10 Essential (primary) hypertension: Secondary | ICD-10-CM | POA: Diagnosis not present

## 2019-10-23 DIAGNOSIS — I340 Nonrheumatic mitral (valve) insufficiency: Secondary | ICD-10-CM | POA: Diagnosis not present

## 2019-10-23 DIAGNOSIS — I420 Dilated cardiomyopathy: Secondary | ICD-10-CM | POA: Diagnosis present

## 2019-10-23 DIAGNOSIS — Z950 Presence of cardiac pacemaker: Secondary | ICD-10-CM | POA: Diagnosis not present

## 2019-10-23 DIAGNOSIS — J441 Chronic obstructive pulmonary disease with (acute) exacerbation: Secondary | ICD-10-CM | POA: Diagnosis present

## 2019-10-23 DIAGNOSIS — J45901 Unspecified asthma with (acute) exacerbation: Secondary | ICD-10-CM | POA: Diagnosis present

## 2019-10-23 DIAGNOSIS — I132 Hypertensive heart and chronic kidney disease with heart failure and with stage 5 chronic kidney disease, or end stage renal disease: Secondary | ICD-10-CM | POA: Diagnosis present

## 2019-10-23 DIAGNOSIS — Z807 Family history of other malignant neoplasms of lymphoid, hematopoietic and related tissues: Secondary | ICD-10-CM | POA: Diagnosis not present

## 2019-10-23 DIAGNOSIS — I5043 Acute on chronic combined systolic (congestive) and diastolic (congestive) heart failure: Secondary | ICD-10-CM | POA: Diagnosis present

## 2019-10-23 DIAGNOSIS — Z940 Kidney transplant status: Secondary | ICD-10-CM | POA: Diagnosis not present

## 2019-10-23 DIAGNOSIS — J9601 Acute respiratory failure with hypoxia: Secondary | ICD-10-CM | POA: Diagnosis present

## 2019-10-23 DIAGNOSIS — I4819 Other persistent atrial fibrillation: Secondary | ICD-10-CM | POA: Diagnosis present

## 2019-10-23 DIAGNOSIS — Z8249 Family history of ischemic heart disease and other diseases of the circulatory system: Secondary | ICD-10-CM | POA: Diagnosis not present

## 2019-10-23 DIAGNOSIS — Z833 Family history of diabetes mellitus: Secondary | ICD-10-CM | POA: Diagnosis not present

## 2019-10-23 DIAGNOSIS — J440 Chronic obstructive pulmonary disease with acute lower respiratory infection: Secondary | ICD-10-CM | POA: Diagnosis present

## 2019-10-23 DIAGNOSIS — Z86718 Personal history of other venous thrombosis and embolism: Secondary | ICD-10-CM | POA: Diagnosis not present

## 2019-10-23 DIAGNOSIS — Z8673 Personal history of transient ischemic attack (TIA), and cerebral infarction without residual deficits: Secondary | ICD-10-CM | POA: Diagnosis not present

## 2019-10-23 DIAGNOSIS — I739 Peripheral vascular disease, unspecified: Secondary | ICD-10-CM | POA: Diagnosis present

## 2019-10-23 DIAGNOSIS — Z7951 Long term (current) use of inhaled steroids: Secondary | ICD-10-CM | POA: Diagnosis not present

## 2019-10-23 DIAGNOSIS — F329 Major depressive disorder, single episode, unspecified: Secondary | ICD-10-CM | POA: Diagnosis present

## 2019-10-23 DIAGNOSIS — Z20822 Contact with and (suspected) exposure to covid-19: Secondary | ICD-10-CM | POA: Diagnosis present

## 2019-10-23 LAB — BASIC METABOLIC PANEL
Anion gap: 15 (ref 5–15)
BUN: 47 mg/dL — ABNORMAL HIGH (ref 8–23)
CO2: 31 mmol/L (ref 22–32)
Calcium: 9.5 mg/dL (ref 8.9–10.3)
Chloride: 99 mmol/L (ref 98–111)
Creatinine, Ser: 2.1 mg/dL — ABNORMAL HIGH (ref 0.44–1.00)
GFR calc Af Amer: 27 mL/min — ABNORMAL LOW (ref >60)
GFR calc non Af Amer: 23 mL/min — ABNORMAL LOW (ref >60)
Glucose, Bld: 133 mg/dL — ABNORMAL HIGH (ref 70–99)
Potassium: 3.6 mmol/L (ref 3.5–5.1)
Sodium: 145 mmol/L (ref 135–145)

## 2019-10-23 LAB — ECHOCARDIOGRAM COMPLETE
Height: 65 in
Weight: 2433.88 oz

## 2019-10-23 IMAGING — DX DG CHEST 1V
1 series · 1 of 1 positions shown · non-contrast
Comparison: [DATE]

CLINICAL DATA: Hypoxemia

EXAM:
CHEST  1 VIEW

[chest ap]
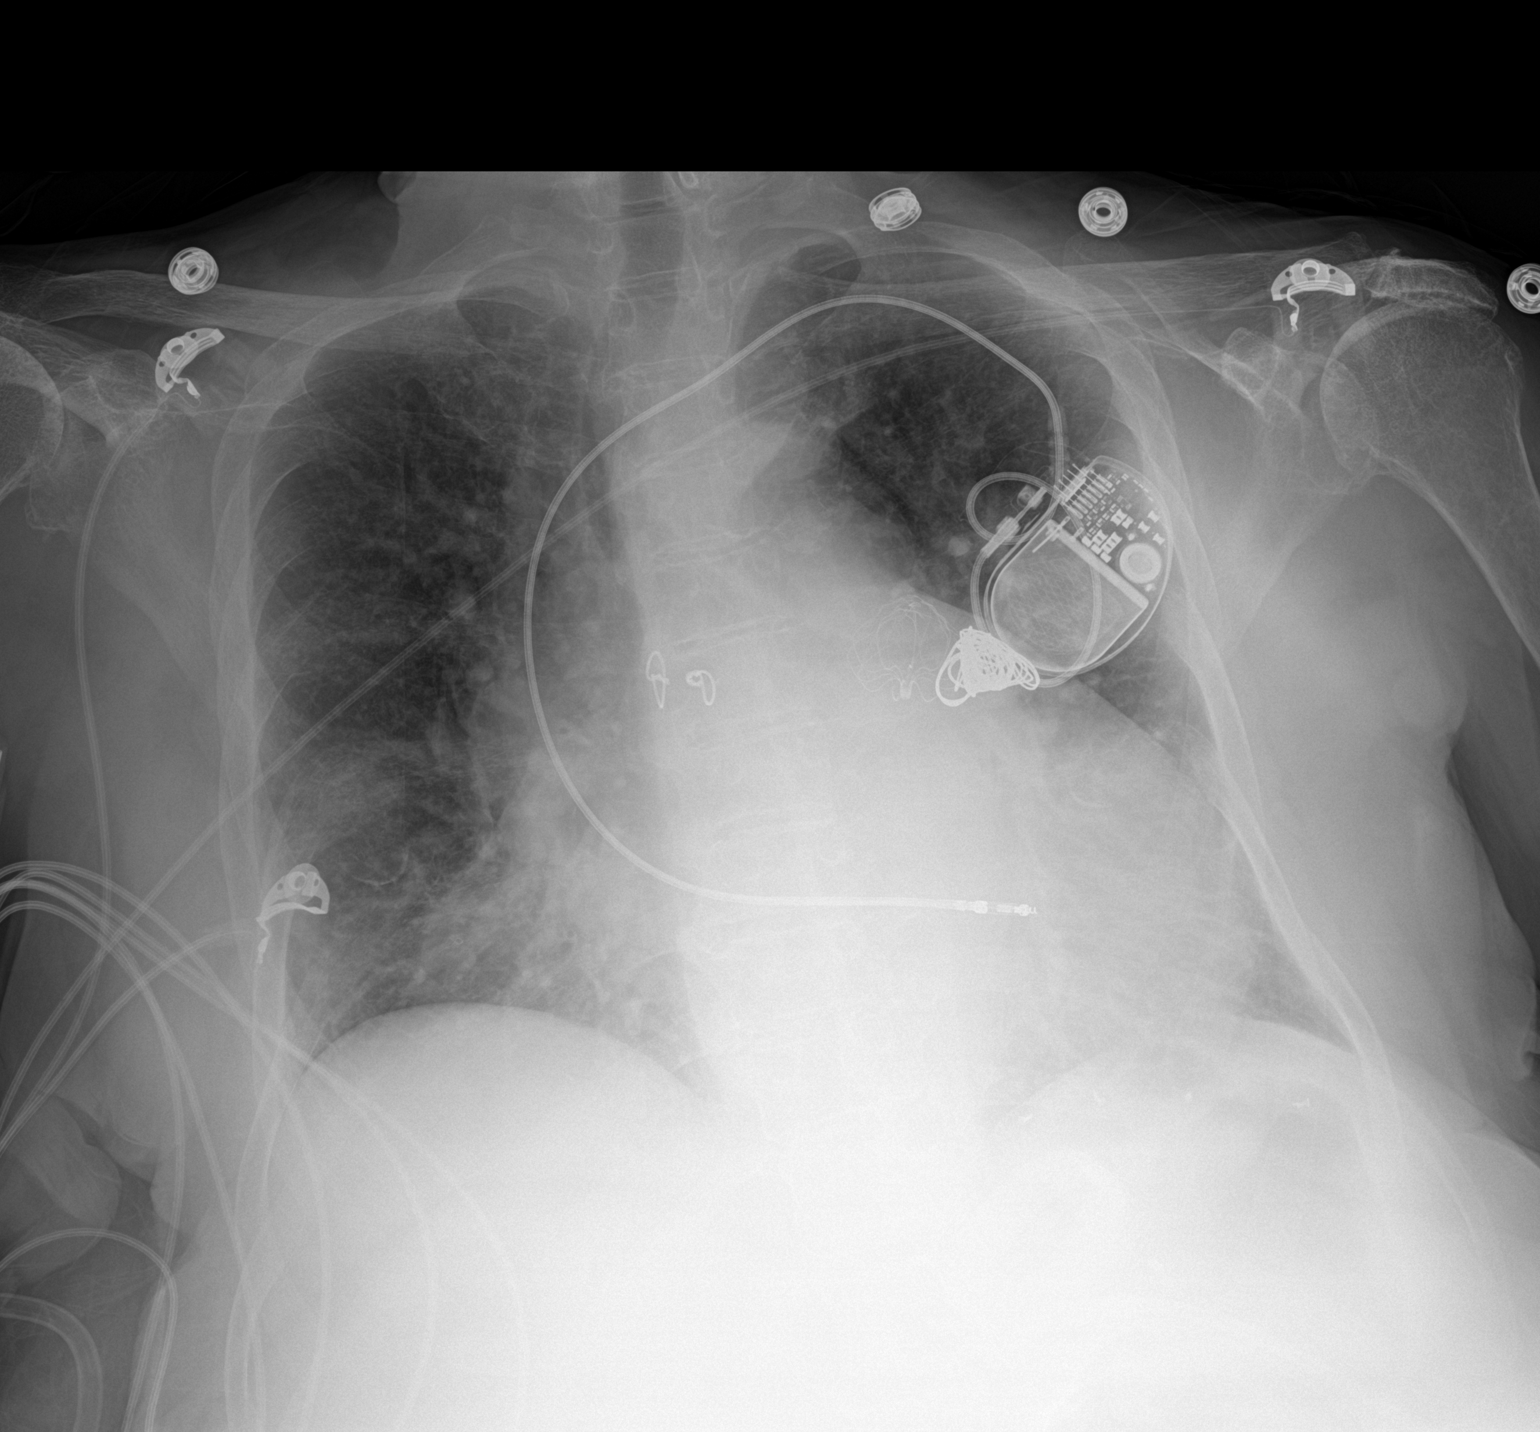

[1 of 1 positions shown; findings below may reference images not displayed]

FINDINGS: Cardiac shadow remains enlarged. Postsurgical changes are again
seen. Occlusion device is noted overlying the left atrial appendage.
Multiple coils are also noted within the left atrial appendage.
Vascular congestive changes are again seen without significant
interstitial edema. No sizable effusion is noted.
IMPRESSION: Persistent vascular congestion.  No acute edema is noted.

## 2019-10-23 MED ORDER — BOOST / RESOURCE BREEZE PO LIQD CUSTOM
1.0000 | Freq: Three times a day (TID) | ORAL | Status: DC
  Administered 2019-10-23 – 2019-10-25 (×4): 1 via ORAL

## 2019-10-23 MED ORDER — ADULT MULTIVITAMIN W/MINERALS CH
1.0000 | ORAL_TABLET | Freq: Every day | ORAL | Status: DC
  Administered 2019-10-23 – 2019-10-25 (×3): 1 via ORAL
  Filled 2019-10-23 (×3): qty 1

## 2019-10-23 NOTE — Progress Notes (Signed)
PROGRESS NOTE    [name]  JKD:[id number] DOB: [birth year] DOA: 10/22/2019 PCP: [name], NP   Brief Narrative: [name] is a 74 y.o. female with medical history significant of systolic heart failure, history of stroke without residual deficits, paroxysmal atrial fibrillation, hypertension, history of renal transplant, nonischemic dilated cardiomyopathy, asthma admitted with progressively worsening shortness of breath and cough. She denied any fever or chills, she denied nausea vomiting or diarrhea.  However she reported having diarrhea a few weeks ago which has been resolved.  She did not have Covid.  She got both shots of Covid vaccine. She denies urinary complaints or abdominal pain.  She denies chest pain or palpitations.  She denies loss of consciousness. History of asthma she is on inhalers and nebulizers at home which did not help her at this time with her breathing.  She sees Dr. Elsworth Soho and is due to have an appointment with him tomorrow morning. Review of her records show that her echo in 2019 with ejection fraction 25%. She denies any weight gain in fact she reports losing weight due to decreased appetite and not eating.  ED Course: She was given Solu-Medrol and Lasix 80 mg daily. Her BNP was 970, chest x-ray consistent with mild congestive heart failure, sodium 141 potassium 3.8 BUN 44 creatinine 2.30 white count 12.7 hemoglobin 10.9 normal platelets. Covid negative.  Assessment & Plan:   Active Problems:   Acute on chronic combined systolic and diastolic congestive heart failure (HCC)   COPD exacerbation (HCC)   Acute CHF (congestive heart failure) (HCC)   #1 acute hypoxic respiratory failure-likely multifactorial, systolic CHF exacerbation, bronchitis, asthma exacerbation.  #2 bronchitis/asthma exacerbation-patient was diffusely wheezing in [facility] ER she received Solu-Medrol 125 mg.  By the time I saw her her wheezing has improved. I will treat her with  Solu-Medrol continue nebulizer and inhaler Breo Ellipta. Start azithromycin.  Her cough sounds very wet and productive though its documented everywhere she has been having nonproductive cough.  Throughout the time that I was in the room she was having productive cough.  She does have a mild leukocytosis. Patient followed by Dr. Elsworth Soho as an outpatient. Continue Solu-Medrol and azithromycin.  #3 acute on chronic systolic CHF exacerbation-patient denies any dietary or medical noncompliance.  Lasix 60 mg IV twice a day. Negative by 794 cc.  Continue Lasix. CHF pathway ordered. Last echo 01/2018-ejection fraction 25 to 30% with diffuse hypokinesis and septal dyskinesis. Repeat echo this admission. Continue IV Lasix and monitor renal functions. Weight 69 kg down from 70.5. #4 CKD stage III//renal transplant-continue mycophenolate and Prograf with sodium bicarbonate.  Renal function stable monitor on diuresis.  #5 anemia of chronic disease hemoglobin stable and at baseline  #6 history of essential hypertension -blood pressure 138/77 continue Coreg.   #7 history of stroke without residual effects on aspirin  #8 patient carries a history of paroxysmal atrial fibrillation and has pacemaker [place].  She follows up at [facility].  #9 constipation continue Colace  #10 depression on Zoloft  Nutrition Problem: Inadequate oral intake Etiology: poor appetite     Signs/Symptoms: per patient/family report    Interventions: Boost Breeze  Estimated body mass index is 25.31 kg/m as calculated from the following:   Height as of this encounter: 5\' 5"  (1.651 m).   Weight as of this encounter: 69 kg.  DVT prophylaxis: Heparin Code Status: Full code Family Communication: Discussed with husband at the bedside Disposition Plan: Patient  came from home Plan discharge to home likely PT consult recommends home with home health. Barriers to discharge is hypoxia secondary to  CHF/bronchitis/asthma Consults called: None   Subjective: She is resting in bed she feels better than yesterday but however she is not back to baseline.  She remains on 2 to 3 L of oxygen.  Objective: Vitals:   10/23/19 1009 10/23/19 1349 10/23/19 1357 10/23/19 1400  BP: 140/80 138/77    Pulse:  70    Resp:  (!) 28  (!) 25  Temp:  98.4 F (36.9 C)    TempSrc:  Oral    SpO2: 100% 100% 97%   Weight:      Height:        Intake/Output Summary (Last 24 hours) at 10/23/2019 1441 Last data filed at 10/23/2019 0837 Gross per 24 hour  Intake 255.72 ml  Output 1050 ml  Net -794.28 ml   Filed Weights   10/22/19 1102 10/22/19 1638 10/23/19 0623  Weight: 70.5 kg 69 kg 69 kg    Examination:  General exam: Appears calm and comfortable  Respiratory system: Crackles at the bases to auscultation. Respiratory effort normal. Cardiovascular system: S1 & S2 heard, RRR. No JVD, murmurs, rubs, gallops or clicks. No pedal edema. Gastrointestinal system: Abdomen is nondistended, soft and nontender. No organomegaly or masses felt. Normal bowel sounds heard. Central nervous system: Alert and oriented. No focal neurological deficits. Extremities: Trace pitting edema bilaterally skin: No rashes, lesions or ulcers Psychiatry: Judgement and insight appear normal. Mood & affect appropriate.     Data Reviewed: I have personally reviewed following labs and imaging studies  CBC: Recent Labs  Lab 10/22/19 1116  WBC 12.7*  NEUTROABS 10.3*  HGB 10.9*  HCT 35.5*  MCV 83.3  PLT 320   Basic Metabolic Panel: Recent Labs  Lab 10/22/19 1116 10/22/19 1325 10/23/19 0508  NA 141  --  145  K 3.8  --  3.6  CL 96*  --  99  CO2 32  --  31  GLUCOSE 108*  --  133*  BUN 44*  --  47*  CREATININE 2.30*  --  2.10*  CALCIUM 9.7  --  9.5  MG  --  1.7  --   PHOS  --  2.6  --    GFR: Estimated Creatinine Clearance: 24 mL/min (A) (by C-G formula based on SCr of 2.1 mg/dL (H)). Liver Function Tests: Recent  Labs  Lab 10/22/19 1116  AST 25  ALT 15  ALKPHOS 104  BILITOT 1.5*  PROT 7.7  ALBUMIN 4.0   No results for input(s): LIPASE, AMYLASE in the last 168 hours. No results for input(s): AMMONIA in the last 168 hours. Coagulation Profile: No results for input(s): INR, PROTIME in the last 168 hours. Cardiac Enzymes: No results for input(s): CKTOTAL, CKMB, CKMBINDEX, TROPONINI in the last 168 hours. BNP (last 3 results) No results for input(s): PROBNP in the last 8760 hours. HbA1C: No results for input(s): HGBA1C in the last 72 hours. CBG: No results for input(s): GLUCAP in the last 168 hours. Lipid Profile: No results for input(s): CHOL, HDL, LDLCALC, TRIG, CHOLHDL, LDLDIRECT in the last 72 hours. Thyroid Function Tests: No results for input(s): TSH, T4TOTAL, FREET4, T3FREE, THYROIDAB in the last 72 hours. Anemia Panel: No results for input(s): VITAMINB12, FOLATE, FERRITIN, TIBC, IRON, RETICCTPCT in the last 72 hours. Sepsis Labs: No results for input(s): PROCALCITON, LATICACIDVEN in the last 168 hours.  Recent Results (from the past 240  hour(s))  SARS CORONAVIRUS 2 (TAT 6-24 HRS) Nasopharyngeal Nasopharyngeal Swab     Status: None   Collection Time: 10/22/19 12:36 PM   Specimen: Nasopharyngeal Swab  Result Value Ref Range Status   SARS Coronavirus 2 NEGATIVE NEGATIVE Final    Comment: (NOTE) SARS-CoV-2 target nucleic acids are NOT DETECTED. The SARS-CoV-2 RNA is generally detectable in upper and lower respiratory specimens during the acute phase of infection. Negative results do not preclude SARS-CoV-2 infection, do not rule out co-infections with other pathogens, and should not be used as the sole basis for treatment or other patient management decisions. Negative results must be combined with clinical observations, patient history, and epidemiological information. The expected result is Negative. Fact Sheet for Patients: SugarRoll.be Fact  Sheet for Healthcare Providers: https://www.[employer].com/ This test is not yet approved or cleared by the [place] FDA and  has been authorized for detection and/or diagnosis of SARS-CoV-2 by FDA under an Emergency Use Authorization (EUA). This EUA will remain  in effect (meaning this test can be used) for the duration of the COVID-19 declaration under Section 56 4(b)(1) of the Act, 21 U.S.C. section 360bbb-3(b)(1), unless the authorization is terminated or revoked sooner. Performed at [facility], [place] [address] [place], [place] [address]          Radiology Studies: DG Chest Port 1 View  Result Date: 10/22/2019 CLINICAL DATA:  Shortness of breath, cough EXAM: PORTABLE CHEST 1 VIEW COMPARISON:  11/14/2018 FINDINGS: Cardiomegaly with left chest single lead pacer defibrillator and Watchman left atrial appendage occlusion device. Mild, diffuse interstitial pulmonary opacity. The visualized skeletal structures are unremarkable. IMPRESSION: Cardiomegaly with mild, diffuse interstitial pulmonary opacity, likely edema. Electronically Signed   By: Eddie Candle M.D.   On: 10/22/2019 11:50        Scheduled Meds:  aspirin EC  81 mg Oral Daily   carvedilol  12.5 mg Oral BID   feeding supplement  1 Container Oral TID BM   fluticasone furoate-vilanterol  1 puff Inhalation Daily   furosemide  60 mg Intravenous Q12H   heparin injection (subcutaneous)  5,000 Units Subcutaneous Q8H   methylPREDNISolone (SOLU-MEDROL) injection  40 mg Intravenous Q8H   multivitamin with minerals  1 tablet Oral Daily   mycophenolate  180 mg Oral BID   sertraline  100 mg Oral Daily   sodium bicarbonate  650 mg Oral BID   tacrolimus  1 mg Oral QHS   And   tacrolimus  2 mg Oral Daily   Continuous Infusions:  azithromycin 500 mg (10/22/19 2013)     LOS: 0 days     Georgette Shell, MD 10/23/2019, 2:41 PM

## 2019-10-23 NOTE — TOC Progression Note (Signed)
Transition of Care [facility]) - Progression Note    Patient Details  Name: [name] MRN: [id number] Date of Birth: [birth year]  Transition of Care [facility]) CM/SW Contact  Shaina Gullatt, Juliann Pulse, RN Phone Number: 10/23/2019, 1:00 PM  Clinical Narrative: PT recc HHPT-patient chose [facility] rep Santiago Glad aware.      Expected Discharge Plan: [place] Barriers to Discharge: Continued Medical Work up  Expected Discharge Plan and Services Expected Discharge Plan: [place]   Discharge Planning Services: CM Consult   Living arrangements for the past 2 months: Single Family Home                                       Social Determinants of Health (SDOH) Interventions    Readmission Risk Interventions No flowsheet data found.

## 2019-10-23 NOTE — Progress Notes (Signed)
Initial Nutrition Assessment  DOCUMENTATION CODES:   Not applicable  INTERVENTION:  - will order Boost Breeze po TID, each supplement provides 250 kcal and 9 grams of protein. - will order daily multivitamin with minerals. - continue to encourage PO intakes.    NUTRITION DIAGNOSIS:   Inadequate oral intake related to poor appetite as evidenced by per patient/family report.  GOAL:   Patient will meet greater than or equal to 90% of their needs  MONITOR:   PO intake, Supplement acceptance, Labs, Weight trends  REASON FOR ASSESSMENT:   Malnutrition Screening Tool  ASSESSMENT:   74 y.o. female with medical history of systolic heart failure, stroke without residual deficits, afib, HTN, hx of renal transplant, non-ischemic dilated cardiomyopathy, and asthma. She was admitted with progressively worsening SOB and cough. She reported having diarrhea a few weeks PTA which has since resolved. She reported having a decreased appetite and not eating well PTA.  Patient reports poor appetite over the past 1 year, mainly only eating a cracker when taking PO medications in the morning and then not eating anything the rest of the day. Sometimes she craves items but as soon as she gets them she finds she is unable to consume them d/t lack of appetite and lack of interest. Some times the smell and taste of foods cause her to be nauseated but she does not vomit. She denies any abdominal pain/pressure in the time PTA.   She does not drink water, but does drink 8 cups (8 oz each) of juice and soda each day. She consumed 50% of an egg salad sandwich and 4 graham cracker halves for lunch today. No abdominal discomfort or nausea after the meal.   She reports intermittent episodes of diarrhea and constipation which she is unable to link to any particular foods or to whether she does or does not eat and drink.   Per chart review, weight today is 152 lb. Weight on 11/05/18 was 157 and weight on 09/10/18 was  164 lb. This indicates 12 lb weight loss (7.3% body weight) in the past 13.5 months; not significant for time frame.    Labs reviewed; BUN: 47 mg/dl, creatinine: 2.1 mg/dl, GFR: 27 ml/min. Medications reviewed; 60 mg IV lasix BID, 40 mg solu-medrol TID, 650 mg sodium bicarb BID, 1 mg prograf/day, 2 mg prograf/day.     NUTRITION - FOCUSED PHYSICAL EXAM:    Most Recent Value  Orbital Region  No depletion  Upper Arm Region  Mild depletion  Thoracic and Lumbar Region  No depletion  Buccal Region  No depletion  Temple Region  No depletion  Clavicle Bone Region  Mild depletion  Clavicle and Acromion Bone Region  No depletion  Scapular Bone Region  No depletion  Dorsal Hand  No depletion  Patellar Region  No depletion  Anterior Thigh Region  No depletion  Posterior Calf Region  Mild depletion  Edema (RD Assessment)  None  Hair  Reviewed  Eyes  Reviewed  Mouth  Reviewed  Skin  Reviewed  Nails  Reviewed       Diet Order:   Diet Order            Diet Heart Room service appropriate? Yes; Fluid consistency: Thin  Diet effective now              EDUCATION NEEDS:   Not appropriate for education at this time  Skin:  Skin Assessment: Reviewed RN Assessment  Last BM:  4/7  Height:  Ht Readings from Last 1 Encounters:  10/22/19 5\' 5"  (1.651 m)    Weight:   Wt Readings from Last 1 Encounters:  10/23/19 69 kg    Ideal Body Weight:  56.8 kg  BMI:  Body mass index is 25.31 kg/m.  Estimated Nutritional Needs:   Kcal:  1500-1700 kcal  Protein:  70-85 grams  Fluid:  >/= 1.7 L/day     Jarome Matin, MS, RD, LDN, CNSC Inpatient Clinical Dietitian RD pager # available in [facility]  After hours/weekend pager # available in [facility]

## 2019-10-23 NOTE — Progress Notes (Signed)
  Echocardiogram 2D Echocardiogram has been performed.  [name] 10/23/2019, 1:47 PM

## 2019-10-23 NOTE — TOC Initial Note (Signed)
Transition of Care [facility]) - Initial/Assessment Note    Patient Details  Name: [name] MRN: [id number] Date of Birth: [birth year]  Transition of Care [facility]) CM/SW Contact:    Dessa Phi, RN Phone Number: 10/23/2019, 12:53 PM  Clinical Narrative: CM referral for HF screen-only 1 adm/6 months-not appropriate for HF protocal. Will follow for d/c needs.                  Expected Discharge Plan: Home/Self Care Barriers to Discharge: Continued Medical Work up   Patient Goals and CMS Choice Patient states their goals for this hospitalization and ongoing recovery are:: go home      Expected Discharge Plan and Services Expected Discharge Plan: Home/Self Care   Discharge Planning Services: CM Consult   Living arrangements for the past 2 months: Single Family Home                                      Prior Living Arrangements/Services Living arrangements for the past 2 months: Single Family Home Lives with:: Spouse Patient language and need for interpreter reviewed:: Yes Do you feel safe going back to the place where you live?: Yes      Need for Family Participation in Patient Care: No (Comment) Care giver support system in place?: Yes (comment)   Criminal Activity/Legal Involvement Pertinent to Current Situation/Hospitalization: No - Comment as needed  Activities of Daily Living Home Assistive Devices/Equipment: Eyeglasses, Cane (specify quad or straight) ADL Screening (condition at time of admission) Patient's cognitive ability adequate to safely complete daily activities?: Yes Is the patient deaf or have difficulty hearing?: No Does the patient have difficulty seeing, even when wearing glasses/contacts?: No Does the patient have difficulty concentrating, remembering, or making decisions?: Yes Patient able to express need for assistance with ADLs?: Yes Does the patient have difficulty dressing or bathing?: No Independently performs ADLs?: No Communication:  Independent Dressing (OT): Independent Grooming: Independent Feeding: Independent Bathing: Independent Toileting: Needs assistance Is this a change from baseline?: Pre-admission baseline In/Out Bed: Needs assistance Is this a change from baseline?: Pre-admission baseline Walks in Home: Needs assistance Is this a change from baseline?: Pre-admission baseline Does the patient have difficulty walking or climbing stairs?: Yes Weakness of Legs: Both Weakness of Arms/Hands: Both  Permission Sought/Granted Permission sought to share information with : Case Manager Permission granted to share information with : Yes, Verbal Permission Granted  Share Information with NAME: Case Manager     Permission granted to share info w Relationship: Dashawn Golda spouse [phone]     Emotional Assessment Appearance:: Appears stated age Attitude/Demeanor/Rapport: Gracious Affect (typically observed): Accepting Orientation: : Oriented to Self, Oriented to Place, Oriented to  Time, Oriented to Situation Alcohol / Substance Use: Not Applicable Psych Involvement: No (comment)  Admission diagnosis:  COPD exacerbation (HCC) [J44.1] Acute on chronic combined systolic and diastolic congestive heart failure (HCC) [I50.43] Acute on chronic combined systolic and diastolic CHF (congestive heart failure) (HCC) [I50.43] Acute CHF (congestive heart failure) ([place]) [I50.9] Patient Active Problem List   Diagnosis Date Noted  . Acute CHF (congestive heart failure) ([place]) 10/23/2019  . Acute on chronic combined systolic and diastolic congestive heart failure ([place]) 10/22/2019  . COPD exacerbation ([place])   . S/P AV nodal ablation 10/08/2018  . Cardiac pacemaker in situ 04/18/2018  . History of amiodarone therapy 03/07/2018  . Oral mucositis 02/22/2018  . 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 Personal history of PE (pulmonary embolism) 02/22/2018  . Acute on chronic systolic heart failure ([place]) 02/11/2018   . Hyperkalemia 02/03/2018  . Atrial fibrillation with RVR ([place]) 02/03/2018  . Lower extremity edema 12/10/2017  . Acute kidney injury superimposed on chronic kidney disease ([place]) 11/17/2017  . Acute diastolic (congestive) heart failure ([place]) 11/17/2017  . UTI (urinary tract infection) 11/03/2017  . Diarrhea 11/03/2017  . Presence of Watchman left atrial appendage closure device 10/04/2017  . Chronic respiratory failure with hypoxia ([place]) 10/03/2017  . Persistent atrial fibrillation ([place]) 08/12/2017  . Dyspnea 07/26/2017  . Atrial flutter ([place])   . Peripheral vascular disease ([place])   . Oligouria   . Hypertension   . Fractures, stress   . Eczema   . Distal radius fracture, right   . Depression   . Constipation   . Arthritis   . Exacerbation of asthma 04/01/2017  . Closed fracture of right distal radius 10/18/2016  . Proteinuria 09/04/2016  . 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 Immunosuppression ([place]) 07/25/2015  . Nonischemic dilated cardiomyopathy ([place])   . Use of cane as ambulatory aid 04/07/2013  . Expected blood loss anemia 03/26/2012  . S/P right TH revision 03/25/2012  . DVT of lower extremity, bilateral ([place]) 12/21/2011  . Personal history of DVT (deep vein thrombosis) 12/08/2011  . Obstructive sleep apnea 12/08/2011  . End stage renal disease ([place]) 11/06/2011  . 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 Pseudomonas aeruginosa infection 10/24/2011  . ESRD (end stage renal disease) on dialysis ([place]) 09/14/2011  . Hypercalcemia   . Gout 09/10/2011  . Absence of right hip joint with antibiotic pacer 09/03/2011  . Traumatic seroma of thigh ([place]) 06/11/2011  . Clostridium difficile colitis 06/09/2011  . Leukocytosis 06/08/2011  . Anemia 06/06/2011  . ARF (acute renal failure) ([place]) 06/06/2011  . SINUSITIS- ACUTE-NOS 09/27/2010  . DEPRESSION 11/25/2008  . PAF (paroxysmal atrial fibrillation) ([place]) 11/25/2008  . RENAL FAILURE, ACUTE 11/25/2008  . CVA 09/28/2008   . CVA (cerebrovascular accident due to intracerebral hemorrhage) ([place]) 09/28/2008  . WEAKNESS, LEFT SIDE OF BODY 09/21/2008  . Morbid obesity ([place]) 10/20/2007  . Psoriasis 10/20/2007  . Stroke due to intracerebral hemorrhage ([place]) 07/17/2007  . Stroke ([place]) 07/17/2007  . Essential hypertension 07/07/2007  . Asthma 07/07/2007   PCP:  [name], NP Pharmacy:   [id number] - [place], [place] [address] [place] [address] Phone: [phone] Fax: [phone]  [facility] [place], [place] [address] [place] [address] Phone: [phone] Fax: [phone]     Social Determinants of Health (SDOH) Interventions    Readmission Risk Interventions No flowsheet data found.

## 2019-10-23 NOTE — Plan of Care (Signed)

## 2019-10-23 NOTE — Evaluation (Signed)
Physical Therapy Evaluation Patient Details Name: [name] MRN: [id number] DOB: 23-Dec-1945 Today's Date: 10/23/2019   History of Present Illness  Pt is a 74 year old female admitted for acute hypoxic respiratory failure-likely multifactorial, systolic CHF exacerbation, bronchitis, asthma exacerbation.  PMH includes h/o renal transplant (2017), ESRD, CKDIII, DVT s/p IVC filter placement, CVA, HTN, chronic immunosuppression, a-fib s/p watchman device placement.  Clinical Impression  Pt admitted with above diagnosis.  Pt currently with functional limitations due to the deficits listed below (see PT Problem List). Pt will benefit from skilled PT to increase their independence and safety with mobility to allow discharge to the venue listed below.  Pt assisted with ambulating however mildly unsteady and only tolerated short distance due to fatigue and dyspnea.       Follow Up Recommendations Home health PT;Supervision - Intermittent    Equipment Recommendations  None recommended by PT    Recommendations for Other Services       Precautions / Restrictions Precautions Precautions: Fall      Mobility  Bed Mobility Overal bed mobility: Needs Assistance Bed Mobility: Supine to Sit;Sit to Supine     Supine to sit: Supervision;HOB elevated Sit to supine: Supervision;HOB elevated      Transfers Overall transfer level: Needs assistance Equipment used: Straight cane Transfers: Sit to/from Stand Sit to Stand: Min assist         General transfer comment: assist to steady with rise  Ambulation/Gait Ambulation/Gait assistance: Min guard;Min assist Gait Distance (Feet): 80 Feet Assistive device: Straight cane Gait Pattern/deviations: Step-through pattern;Decreased stride length     General Gait Details: mildly unsteady with her cane however declined using RW today, SPO2 91% on room air upon returning to bed and pt reports dyspnea so reapplied 2L O2 [place]  Stairs             Wheelchair Mobility    Modified Rankin (Stroke Patients Only)       Balance Overall balance assessment: Mild deficits observed, not formally tested                                           Pertinent Vitals/Pain Pain Assessment: No/denies pain    Home Living Family/patient expects to be discharged to:: Private residence Living Arrangements: Spouse/significant other   Type of Home: House Home Access: Stairs to enter Entrance Stairs-Rails: Psychiatric nurse of Steps: 8 Home Layout: Able to live on main level with bedroom/bathroom;Laundry or work area in [place]: Environmental consultant - 2 wheels;Cane - single point;Walker - 4 wheels      Prior Function Level of Independence: Independent with assistive device(s)         Comments: uses cane     Hand Dominance        Extremity/Trunk Assessment        Lower Extremity Assessment Lower Extremity Assessment: Generalized weakness       Communication   Communication: No difficulties  Cognition Arousal/Alertness: Awake/alert Behavior During Therapy: WFL for tasks assessed/performed Overall Cognitive Status: Within Functional Limits for tasks assessed                                        General Comments      Exercises     Assessment/Plan    PT  Assessment Patient needs continued PT services  PT Problem List Decreased balance;Decreased activity tolerance;Decreased strength;Decreased mobility;Cardiopulmonary status limiting activity;Decreased knowledge of use of DME       PT Treatment Interventions DME instruction;Gait training;Therapeutic exercise;Therapeutic activities;Patient/family education;Functional mobility training;Balance training    PT Goals (Current goals can be found in the Care Plan section)  Acute Rehab PT Goals PT Goal Formulation: With patient Time For Goal Achievement: 11/06/19 Potential to Achieve Goals: Good    Frequency Min  3X/week   Barriers to discharge        Co-evaluation               AM-PAC PT "6 Clicks" Mobility  Outcome Measure Help needed turning from your back to your side while in a flat bed without using bedrails?: None Help needed moving from lying on your back to sitting on the side of a flat bed without using bedrails?: None Help needed moving to and from a bed to a chair (including a wheelchair)?: A Little Help needed standing up from a chair using your arms (e.g., wheelchair or bedside chair)?: A Little Help needed to walk in hospital room?: A Little Help needed climbing 3-5 steps with a railing? : A Lot 6 Click Score: 19    End of Session Equipment Utilized During Treatment: Gait belt Activity Tolerance: Patient tolerated treatment well Patient left: in bed;with call bell/phone within reach;with bed alarm set   PT Visit Diagnosis: Difficulty in walking, not elsewhere classified (R26.2)    Time: 6599-3570 PT Time Calculation (min) (ACUTE ONLY): 11 min   Charges:   PT Evaluation $PT Eval Low Complexity: 1 Low     Kati PT, DPT Acute Rehabilitation Services Office: [phone]  Trena Platt 10/23/2019, 12:47 PM

## 2019-10-24 DIAGNOSIS — R0902 Hypoxemia: Secondary | ICD-10-CM

## 2019-10-24 DIAGNOSIS — I10 Essential (primary) hypertension: Secondary | ICD-10-CM

## 2019-10-24 LAB — BASIC METABOLIC PANEL
Anion gap: 14 (ref 5–15)
BUN: 59 mg/dL — ABNORMAL HIGH (ref 8–23)
CO2: 29 mmol/L (ref 22–32)
Calcium: 9.6 mg/dL (ref 8.9–10.3)
Chloride: 97 mmol/L — ABNORMAL LOW (ref 98–111)
Creatinine, Ser: 2.18 mg/dL — ABNORMAL HIGH (ref 0.44–1.00)
GFR calc Af Amer: 26 mL/min — ABNORMAL LOW (ref >60)
GFR calc non Af Amer: 22 mL/min — ABNORMAL LOW (ref >60)
Glucose, Bld: 134 mg/dL — ABNORMAL HIGH (ref 70–99)
Potassium: 3.7 mmol/L (ref 3.5–5.1)
Sodium: 140 mmol/L (ref 135–145)

## 2019-10-24 MED ORDER — ARFORMOTEROL TARTRATE 15 MCG/2ML IN NEBU
15.0000 ug | INHALATION_SOLUTION | Freq: Two times a day (BID) | RESPIRATORY_TRACT | Status: DC
  Administered 2019-10-24 – 2019-10-25 (×2): 15 ug via RESPIRATORY_TRACT
  Filled 2019-10-24 (×2): qty 2

## 2019-10-24 MED ORDER — BENZONATATE 100 MG PO CAPS
200.0000 mg | ORAL_CAPSULE | Freq: Three times a day (TID) | ORAL | Status: DC
  Administered 2019-10-24 – 2019-10-25 (×3): 200 mg via ORAL
  Filled 2019-10-24 (×3): qty 2

## 2019-10-24 MED ORDER — GUAIFENESIN ER 600 MG PO TB12
600.0000 mg | ORAL_TABLET | Freq: Two times a day (BID) | ORAL | Status: DC
  Administered 2019-10-24 – 2019-10-25 (×3): 600 mg via ORAL
  Filled 2019-10-24 (×3): qty 1

## 2019-10-24 MED ORDER — CHLORHEXIDINE GLUCONATE CLOTH 2 % EX PADS
6.0000 | MEDICATED_PAD | Freq: Every day | CUTANEOUS | Status: DC
  Administered 2019-10-24 – 2019-10-25 (×2): 6 via TOPICAL

## 2019-10-24 MED ORDER — BUDESONIDE 0.25 MG/2ML IN SUSP
0.2500 mg | Freq: Two times a day (BID) | RESPIRATORY_TRACT | Status: DC
  Administered 2019-10-24 – 2019-10-25 (×2): 0.25 mg via RESPIRATORY_TRACT
  Filled 2019-10-24 (×2): qty 2

## 2019-10-24 MED ORDER — IPRATROPIUM-ALBUTEROL 0.5-2.5 (3) MG/3ML IN SOLN: 3.0000 mL | Freq: Four times a day (QID) | RESPIRATORY_TRACT | Status: DC

## 2019-10-24 MED ORDER — IPRATROPIUM-ALBUTEROL 0.5-2.5 (3) MG/3ML IN SOLN
3.0000 mL | Freq: Three times a day (TID) | RESPIRATORY_TRACT | Status: DC
  Administered 2019-10-24 – 2019-10-25 (×2): 3 mL via RESPIRATORY_TRACT
  Filled 2019-10-24 (×2): qty 3

## 2019-10-24 NOTE — Progress Notes (Signed)
Patient has new formed raised hematoma that is nonpainful on right antecubital arm. Patient report this formed after blood drawn from  previous shift. On-call provider made aware.

## 2019-10-24 NOTE — Progress Notes (Signed)
[facility]                                                                              Patient Demographics  [name], is a 74 y.o. female, DOB - [birth year], BHA:[id number]  Admit date - 10/22/2019   Admitting Physician Georgette Shell, MD  Outpatient Primary MD for the patient is [name], [name], NP  Outpatient specialists:   LOS - 1  days   Medical records reviewed and are as summarized below:    Chief Complaint  Patient presents with  . Shortness of Breath  . Asthma       Brief summary   Patient is a 74 year old female with history of chronic systolic CHF, history of stroke without residual deficits, paroxysmal atrial fibrillation, hypertension, history of renal transplant, nonischemic dilated cardiomyopathy, asthma presented with shortness of breath, progressively worsening and coughing.  Denied any fevers or chills, no nausea vomiting or diarrhea.  Patient is on inhalers and nebulizers at home, which did not help with her breathing.  Patient follows pulmonology, Dr. Elsworth Soho outpatient.  Patient received Solu-Medrol and Lasix 80 mg in ED.   Assessment & Plan    Principal Problem:  Acute respiratory failure with hypoxia secondary to Acute on chronic combined systolic and diastolic congestive heart failure ([place]), bronchitis with asthma exacerbation -Patient was placed on IV Lasix for diuresis, continue IV Lasix 60 mg every 12 hours -2D echo showed EF of 55 to 60%, mild concentric LVH, right ventricular volume overload -Follow strict I's and O's and daily weights, negative balance of 1.7 L -2D echo in 01/2018 had shown EF of 25 to 30% with diffuse hypokinesis -Follow creatinine function closely  Active Problems: Acute asthma exacerbation with bronchitis -On exam coarse breath sounds at the bases with scattered wheezing -Continue IV Solu-Medrol, placed on flutter valve, Pulmicort, Brovana, scheduled nebs -Continue IV Zithromax, added Mucinex,  Tessalon Perles  History of CKD stage III/renal transplant -Continue mycophenolate, Prograf, sodium bicarb -Monitor renal function with diuresis     Essential hypertension -Continue Lasix, Coreg, placed on IV hydralazine as needed with parameters     Anemia  of chronic disease -H&H currently stable    Stroke ([place]), no residual deficits -Continue aspirin  Paroxysmal  atrial fibrillation ([place]) -Has pacemaker, follows at [facility]  Depression Continue Zoloft  Code Status: Full CODE STATUS DVT Prophylaxis: Heparin subcu Family Communication: Discussed all imaging results, lab results, explained to the patient   Disposition Plan: Patient from home, anticipate discharge home in next 24 to 48 hours if wheezing improves.   Time Spent in minutes 25 minutes  Procedures:  2D echo  Consultants:   None  Antimicrobials:   Anti-infectives (From admission, onward)   Start     Dose/Rate Route Frequency Ordered Stop   10/22/19 2000  azithromycin (ZITHROMAX) 500 mg in sodium chloride 0.9 % 250 mL IVPB     500 mg 250 mL/hr over 60 Minutes Intravenous Every 24 hours 10/22/19 1804           Medications  Scheduled Meds: . aspirin EC  81 mg Oral Daily  . carvedilol  12.5 mg Oral BID  . Chlorhexidine Gluconate Cloth  6 each Topical Daily  . feeding supplement  1 Container Oral TID BM  . fluticasone furoate-vilanterol  1 puff Inhalation Daily  . furosemide  60 mg Intravenous Q12H  . heparin injection (subcutaneous)  5,000 Units Subcutaneous Q8H  . methylPREDNISolone (SOLU-MEDROL) injection  40 mg Intravenous Q8H  . multivitamin with minerals  1 tablet Oral Daily  . mycophenolate  180 mg Oral BID  . sertraline  100 mg Oral Daily  . sodium bicarbonate  650 mg Oral BID  . tacrolimus  1 mg Oral QHS   And  . tacrolimus  2 mg Oral Daily   Continuous Infusions: . azithromycin 500 mg (10/23/19 2023)   PRN Meds:.albuterol, benzonatate, docusate  sodium      Subjective:   [name] was seen and examined today.  States she is feeling 75% better from the time of admission.  Still coughing, coarse breath sounds and wheezing.  No fevers or chills.  Patient denies dizziness, chest pain,  abdominal pain, N/V/D/C, new weakness, numbess, tingling. No acute events overnight.    Objective:   Vitals:   10/23/19 2357 10/24/19 0400 10/24/19 0800 10/24/19 1042  BP: (!) 153/90  (!) 157/84   Pulse: 68  78 77  Resp:   20 (!) 22  Temp: 98 F (36.7 C)  97.7 F (36.5 C)   TempSrc: Oral  Oral   SpO2: 99%  98% 98%  Weight:  70.3 kg    Height:        Intake/Output Summary (Last 24 hours) at 10/24/2019 1218 Last data filed at 10/24/2019 0200 Gross per 24 hour  Intake 11.8 ml  Output 900 ml  Net -888.2 ml     Wt Readings from Last 3 Encounters:  10/24/19 70.3 kg  10/22/19 68.2 kg  05/29/19 70.1 kg     Exam  General: Alert and oriented x 3, NAD  Cardiovascular: S1 S2 auscultated, no murmurs, RRR  Respiratory: Coarse breath sound at the bases with scattered wheezing  Gastrointestinal: Soft, nontender, nondistended, + bowel sounds  Ext: no pedal edema bilaterally  Neuro: No new deficits  Musculoskeletal: No digital cyanosis, clubbing  Skin: No rashes  Psych: Normal affect and demeanor, alert and oriented x3    Data Reviewed:  I have personally reviewed following labs and imaging studies  Micro Results Recent Results (from the past 240 hour(s))  SARS CORONAVIRUS 2 (TAT 6-24 HRS) Nasopharyngeal Nasopharyngeal Swab     Status: None   Collection Time: 10/22/19 12:36 PM   Specimen: Nasopharyngeal Swab  Result Value Ref Range Status   SARS Coronavirus 2 NEGATIVE NEGATIVE Final    Comment: (NOTE) SARS-CoV-2 target nucleic acids are NOT DETECTED. The SARS-CoV-2 RNA is generally detectable in upper and lower respiratory specimens during the acute phase of infection. Negative results do not preclude SARS-CoV-2 infection,  do not rule out co-infections with other pathogens, and should not be used as the sole basis for treatment or other patient management decisions. Negative results must be combined with clinical observations, patient history, and epidemiological information. The expected result is Negative. Fact Sheet for Patients: SugarRoll.be Fact Sheet for Healthcare Providers: https://www.woods-mathews.com/ This test is not yet approved or cleared by the [place] FDA and  has been authorized for detection and/or diagnosis of SARS-CoV-2 by FDA under an Emergency Use Authorization (EUA). This EUA will remain  in effect (meaning this test can be used) for the duration of the  COVID-19 declaration under Section 56 4(b)(1) of the Act, 21 U.S.C. section 360bbb-3(b)(1), unless the authorization is terminated or revoked sooner. Performed at [facility], [place] [address], [place], [place] [address]     Radiology Reports DG Chest 1 View  Result Date: 10/23/2019 CLINICAL DATA:  Hypoxemia EXAM: CHEST  1 VIEW COMPARISON:  10/22/2019 FINDINGS: Cardiac shadow remains enlarged. Postsurgical changes are again seen. Occlusion device is noted overlying the left atrial appendage. Multiple coils are also noted within the left atrial appendage. Vascular congestive changes are again seen without significant interstitial edema. No sizable effusion is noted. IMPRESSION: Persistent vascular congestion.  No acute edema is noted. Electronically Signed   By: Inez Catalina M.D.   On: 10/23/2019 15:53   DG Chest Port 1 View  Result Date: 10/22/2019 CLINICAL DATA:  Shortness of breath, cough EXAM: PORTABLE CHEST 1 VIEW COMPARISON:  11/14/2018 FINDINGS: Cardiomegaly with left chest single lead pacer defibrillator and Watchman left atrial appendage occlusion device. Mild, diffuse interstitial pulmonary opacity. The visualized skeletal structures are unremarkable. IMPRESSION: Cardiomegaly  with mild, diffuse interstitial pulmonary opacity, likely edema. Electronically Signed   By: Eddie Candle M.D.   On: 10/22/2019 11:50   ECHOCARDIOGRAM COMPLETE  Result Date: 10/23/2019    ECHOCARDIOGRAM REPORT   Patient Name:   GENNA CASIMIR Date of Exam: 10/23/2019 Medical Rec #:  [id number]     Height:       65.0 in Accession #:    [id number]    Weight:       152.1 lb Date of Birth:  November 11, 1945    BSA:          1.761 m Patient Age:    50 years      BP:           140/80 mmHg Patient Gender: F             HR:           70 bpm. Exam Location:  Inpatient Procedure: 2D Echo, Cardiac Doppler and Color Doppler Indications:    R94.31 Abnormal EKG  History:        Patient has prior history of Echocardiogram examinations, most                 recent 02/04/2018. CHF and Cardiomyopathy, Abnormal ECG, COPD and                 Stroke, Arrythmias:Atrial Fibrillation; Risk Factors:Sleep                 Apnea. Pulmonary embolus.  Sonographer:    Roseanna Rainbow RDCS Referring Phys: [id number] [place]  Sonographer Comments: Image acquisition challenging due to respiratory motion. IMPRESSIONS  1. Left ventricular ejection fraction, by estimation, is 55 to 60%. The left ventricle has normal function. The left ventricle has no regional wall motion abnormalities. There is mild concentric left ventricular hypertrophy. Left ventricular diastolic function could not be evaluated. There is the interventricular septum is flattened in diastole ('D' shaped left ventricle), consistent with right ventricular volume overload.  2. Right ventricular systolic function is low normal. The right ventricular size is moderately enlarged. There is moderately elevated pulmonary artery systolic pressure. The estimated right ventricular systolic pressure is 16.3 mmHg.  3. Left atrial size was severely dilated.  4. Right atrial size was severely dilated.  5. The mitral valve is normal in structure. Mild to moderate mitral valve regurgitation. No evidence  of mitral stenosis.  6.  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  7. The aortic valve is normal in structure. Aortic valve regurgitation is mild. No aortic stenosis is present.  8. The inferior vena cava is dilated in size with <50% respiratory variability, suggesting right atrial pressure of 15 mmHg. Comparison(s): Prior images reviewed side by side. The left ventricular function has improved. The right ventricular systolic function is unchanged. FINDINGS  Left Ventricle: Left ventricular ejection fraction, by estimation, is 55 to 60%. The left ventricle has normal function. The left ventricle has no regional wall motion abnormalities. The left ventricular internal cavity size was normal in size. There is  mild concentric left ventricular hypertrophy. The interventricular septum is flattened in diastole ('D' shaped left ventricle), consistent with right ventricular volume overload. Left ventricular diastolic function could not be evaluated due to atrial fibrillation. Left ventricular diastolic function could not be evaluated. Right Ventricle: The right ventricular size is moderately enlarged. No increase in right ventricular wall thickness. Right ventricular systolic function is low normal. There is moderately elevated pulmonary artery systolic pressure. The tricuspid regurgitant velocity is 3.38 m/s, and with an assumed right atrial pressure of 15 mmHg, the estimated right ventricular systolic pressure is 15.0 mmHg. Left Atrium: Left atrial size was severely dilated. Right Atrium: Right atrial size was severely dilated. Pericardium: There is no evidence of pericardial effusion. Mitral Valve: The mitral valve is normal in structure. Mild to moderate mitral valve regurgitation, with centrally-directed jet. No evidence of mitral valve stenosis. 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 The flow in the hepatic veins is reversed  during ventricular systole. Aortic Valve: The aortic valve is normal in structure. Aortic valve regurgitation is mild. No aortic stenosis is present. Pulmonic Valve: The pulmonic valve was normal in structure. Pulmonic valve regurgitation is not visualized. Aorta: The aortic root and ascending aorta are structurally normal, with no evidence of dilitation. Venous: The inferior vena cava is dilated in size with less than 50% respiratory variability, suggesting right atrial pressure of 15 mmHg. IAS/Shunts: No atrial level shunt detected by color flow Doppler.  LEFT VENTRICLE PLAX 2D LVIDd:         5.40 cm     Diastology LVIDs:         3.72 cm     LV e' lateral:   10.30 cm/s LV PW:         1.42 cm     LV E/e' lateral: 10.0 LV IVS:        1.45 cm     LV e' medial:    7.62 cm/s LVOT diam:     1.80 cm     LV E/e' medial:  13.6 LV SV:         52 LV SV Index:   29 LVOT Area:     2.54 cm  LV Volumes (MOD) LV vol d, MOD A2C: 99.0 ml LV vol d, MOD A4C: 59.7 ml LV vol s, MOD A2C: 50.8 ml LV vol s, MOD A4C: 49.4 ml LV SV MOD A2C:     48.2 ml LV SV MOD A4C:     59.7 ml LV SV MOD BP:      32.2 ml RIGHT VENTRICLE             IVC RV S prime:     11.50 cm/s  IVC diam: 3.29 cm TAPSE (M-mode): 2.1 cm LEFT ATRIUM  Index       RIGHT ATRIUM           Index LA diam:        4.20 cm  2.39 cm/m  RA Area:     16.30 cm LA Vol (A2C):   91.9 ml  52.19 ml/m RA Volume:   38.60 ml  21.92 ml/m LA Vol (A4C):   99.6 ml  56.56 ml/m LA Biplane Vol: 104.0 ml 59.06 ml/m  AORTIC VALVE LVOT Vmax:   107.00 cm/s LVOT Vmean:  75.700 cm/s LVOT VTI:    0.204 m  AORTA Ao Root diam: 3.60 cm MITRAL VALVE                TRICUSPID VALVE MV Area (PHT): 4.25 cm     TR Peak grad:   45.7 mmHg MV Decel Time: 178 msec     TR Vmax:        338.00 cm/s MV E velocity: 103.50 cm/s                             SHUNTS                             Systemic VTI:  0.20 m                             Systemic Diam: 1.80 cm Mihai Croitoru MD Electronically signed by  Sanda Klein MD Signature Date/Time: 10/23/2019/4:27:04 PM    Final     Lab Data:  CBC: Recent Labs  Lab 10/22/19 1116  WBC 12.7*  NEUTROABS 10.3*  HGB 10.9*  HCT 35.5*  MCV 83.3  PLT 542   Basic Metabolic Panel: Recent Labs  Lab 10/22/19 1116 10/22/19 1325 10/23/19 0508 10/24/19 0527  NA 141  --  145 140  K 3.8  --  3.6 3.7  CL 96*  --  99 97*  CO2 32  --  31 29  GLUCOSE 108*  --  133* 134*  BUN 44*  --  47* 59*  CREATININE 2.30*  --  2.10* 2.18*  CALCIUM 9.7  --  9.5 9.6  MG  --  1.7  --   --   PHOS  --  2.6  --   --    GFR: Estimated Creatinine Clearance: 23.3 mL/min (A) (by C-G formula based on SCr of 2.18 mg/dL (H)). Liver Function Tests: Recent Labs  Lab 10/22/19 1116  AST 25  ALT 15  ALKPHOS 104  BILITOT 1.5*  PROT 7.7  ALBUMIN 4.0   No results for input(s): LIPASE, AMYLASE in the last 168 hours. No results for input(s): AMMONIA in the last 168 hours. Coagulation Profile: No results for input(s): INR, PROTIME in the last 168 hours. Cardiac Enzymes: No results for input(s): CKTOTAL, CKMB, CKMBINDEX, TROPONINI in the last 168 hours. BNP (last 3 results) No results for input(s): PROBNP in the last 8760 hours. HbA1C: No results for input(s): HGBA1C in the last 72 hours. CBG: No results for input(s): GLUCAP in the last 168 hours. Lipid Profile: No results for input(s): CHOL, HDL, LDLCALC, TRIG, CHOLHDL, LDLDIRECT in the last 72 hours. Thyroid Function Tests: No results for input(s): TSH, T4TOTAL, FREET4, T3FREE, THYROIDAB in the last 72 hours. Anemia Panel: No results for input(s): VITAMINB12, FOLATE, FERRITIN, TIBC, IRON, RETICCTPCT in the last 72 hours.  Urine analysis:    Component Value Date/Time   COLORURINE AMBER (A) 02/04/2018 1540   APPEARANCEUR CLOUDY (A) 02/04/2018 1540   LABSPEC 1.017 02/04/2018 1540   PHURINE 5.0 02/04/2018 1540   GLUCOSEU NEGATIVE 02/04/2018 1540   HGBUR LARGE (A) 02/04/2018 1540   HGBUR negative 10/04/2009 1044    BILIRUBINUR NEGATIVE 02/04/2018 1540   BILIRUBINUR negative 12/13/2017 1508   KETONESUR NEGATIVE 02/04/2018 1540   PROTEINUR 100 (A) 02/04/2018 1540   UROBILINOGEN 0.2 12/13/2017 1508   UROBILINOGEN 0.2 03/25/2012 1354   NITRITE NEGATIVE 02/04/2018 1540   LEUKOCYTESUR MODERATE (A) 02/04/2018 1540     Kamalei Roeder M.D. [facility] 10/24/2019, 12:18 PM   Call night coverage person covering after 7pm

## 2019-10-25 LAB — BASIC METABOLIC PANEL
Anion gap: 10 (ref 5–15)
BUN: 66 mg/dL — ABNORMAL HIGH (ref 8–23)
CO2: 31 mmol/L (ref 22–32)
Calcium: 9.3 mg/dL (ref 8.9–10.3)
Chloride: 100 mmol/L (ref 98–111)
Creatinine, Ser: 2.2 mg/dL — ABNORMAL HIGH (ref 0.44–1.00)
GFR calc Af Amer: 25 mL/min — ABNORMAL LOW (ref >60)
GFR calc non Af Amer: 22 mL/min — ABNORMAL LOW (ref >60)
Glucose, Bld: 122 mg/dL — ABNORMAL HIGH (ref 70–99)
Potassium: 3.6 mmol/L (ref 3.5–5.1)
Sodium: 141 mmol/L (ref 135–145)

## 2019-10-25 MED ORDER — HYDROCODONE-HOMATROPINE 5-1.5 MG/5ML PO SYRP: 5.0000 mL | ORAL_SOLUTION | Freq: Three times a day (TID) | ORAL | 0 refills | Status: DC | PRN

## 2019-10-25 MED ORDER — PREDNISONE 20 MG PO TABS
40.0000 mg | ORAL_TABLET | Freq: Every day | ORAL | Status: DC
  Administered 2019-10-25: 40 mg via ORAL
  Filled 2019-10-25: qty 2

## 2019-10-25 MED ORDER — BENZONATATE 100 MG PO CAPS: 100.0000 mg | ORAL_CAPSULE | Freq: Three times a day (TID) | ORAL | 0 refills | Status: DC | PRN

## 2019-10-25 MED ORDER — DOXYCYCLINE HYCLATE 100 MG PO TABS: 100.0000 mg | ORAL_TABLET | Freq: Two times a day (BID) | ORAL | 0 refills | Status: AC

## 2019-10-25 MED ORDER — HYDROCODONE-HOMATROPINE 5-1.5 MG/5ML PO SYRP: 5.0000 mL | ORAL_SOLUTION | Freq: Four times a day (QID) | ORAL | Status: DC | PRN

## 2019-10-25 MED ORDER — CARVEDILOL 12.5 MG PO TABS: 12.5000 mg | ORAL_TABLET | Freq: Two times a day (BID) | ORAL | 3 refills | Status: DC

## 2019-10-25 MED ORDER — PREDNISONE 5 MG PO TABS: 5.0000 mg | ORAL_TABLET | Freq: Every day | ORAL | Status: DC

## 2019-10-25 MED ORDER — FUROSEMIDE 80 MG PO TABS: 80.0000 mg | ORAL_TABLET | Freq: Two times a day (BID) | ORAL | 3 refills | Status: DC

## 2019-10-25 MED ORDER — DOXYCYCLINE HYCLATE 100 MG PO TABS
100.0000 mg | ORAL_TABLET | Freq: Two times a day (BID) | ORAL | Status: DC
  Administered 2019-10-25: 100 mg via ORAL
  Filled 2019-10-25: qty 1

## 2019-10-25 MED ORDER — PREDNISONE 10 MG PO TABS: ORAL_TABLET | ORAL | 0 refills | Status: DC

## 2019-10-25 MED ORDER — ALBUTEROL SULFATE (2.5 MG/3ML) 0.083% IN NEBU: 2.5000 mg | INHALATION_SOLUTION | Freq: Four times a day (QID) | RESPIRATORY_TRACT | 2 refills | Status: DC | PRN

## 2019-10-25 NOTE — Progress Notes (Signed)
Discussed all patients discharge information including medications and follow up appts with patient. All personal belongings including earrings, cell phone, charger, clothing, cane, jewelry sent with patient. IV removed. No further questions.

## 2019-10-25 NOTE — Discharge Summary (Signed)
Physician Discharge Summary   Patient ID: LIBI CORSO MRN: [id number] DOB/AGE: [birth year] 74 y.o.  Admit date: 10/22/2019 Discharge date: 10/25/2019  Primary Care Physician:  [name], NP   Recommendations for Outpatient Follow-up:  1. Follow up with PCP in 1-2 weeks  Home Health: PT Equipment/Devices:   Discharge Condition: stable CODE STATUS: FULL  Diet recommendation: Heart healthy diet   Discharge Diagnoses:   Acute respiratory failure with hypoxia Acute on chronic combined systolic and diastolic CHF Acute bronchitis with asthma exacerbation History of CKD stage III with renal transplant . Immunosuppression ([place]) . Persistent atrial fibrillation ([place]) . History of stroke ([place]) . Essential hypertension . Anemia of chronic disease   Consults: None    Allergies:   Allergies  Allergen Reactions  . Ace Inhibitors Other (See Comments)    Reaction unknown  . 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 Warfarin Other (See Comments)    Left side brain hemorrhage  . 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   albuterol (2.5 MG/3ML) 0.083% nebulizer solution Commonly known as: PROVENTIL Take 3 mLs (2.5 mg total) by nebulization every 6 (six) hours as needed for wheezing or shortness of breath. Dx:J45.909   aspirin EC 81 MG tablet Take 81 mg by  mouth.   benzonatate 100 MG capsule Commonly known as: TESSALON Take 1 capsule (100 mg total) by mouth 3 (three) times daily as needed for cough.   Breo Ellipta 100-25 MCG/INH Aepb Generic drug: fluticasone furoate-vilanterol Inhale 1 puff into the lungs daily.   carvedilol 12.5 MG tablet Commonly known as: COREG Take 1 tablet (12.5 mg total) by mouth 2 (two) times daily.   docusate sodium 100 MG capsule Commonly known as: COLACE Take 100 mg by mouth daily as needed (For constipation.).   doxycycline 100 MG tablet Commonly known as: VIBRA-TABS Take 1 tablet (100 mg total) by mouth 2 (two) times daily for 7 days.   furosemide 80 MG tablet Commonly known as: LASIX Take 1 tablet (80 mg total) by mouth 2 (two) times daily.   HYDROcodone-homatropine 5-1.5 MG/5ML syrup Commonly known as: HYCODAN Take 5 mLs by mouth every 8 (eight) hours as needed for cough.   IRON PO Take by mouth.   Magnesium 400 MG Tabs Take by mouth 2 (two) times daily.   Myfortic 180 MG EC tablet Generic drug: mycophenolate Take 180 mg by mouth 2 (two) times daily.   potassium chloride SA 20 MEQ tablet Commonly known as: KLOR-CON Take 20 mEq by mouth.   predniSONE 5 MG tablet Commonly known as: DELTASONE Take 1 tablet (5 mg total) by mouth daily with breakfast. Please resume after you have completed prednisone taper What changed: additional instructions   predniSONE 10 MG tablet Commonly  known as: DELTASONE Prednisone dosing: Take  Prednisone 40mg  (4 tabs) x 2 days, then taper to 30mg  (3 tabs) x 3 days, then 20mg  (2 tabs) x 3days, then 10mg  (1 tab) x 3days, then continue your maintenance dose of 5mg  daily What changed: You were already taking a medication with the same name, and this prescription was added. Make sure you understand how and when to take each.   sertraline 100 MG tablet Commonly known as: ZOLOFT Take 1 tablet (100 mg total) by mouth daily.   SODIUM BICARBONATE PO Take by mouth.  10 grain--taking 2 tab PO BID   tacrolimus 1 MG capsule Commonly known as: PROGRAF Take 2 mg by mouth 2 (two) times daily.        Brief H and P: For complete details please refer to admission H and P, but in brief Patient is a 74 year old female with history of chronic systolic CHF, history of stroke without residual deficits, paroxysmal atrial fibrillation, hypertension, history of renal transplant, nonischemic dilated cardiomyopathy, asthma presented with shortness of breath, progressively worsening and coughing.  Denied any fevers or chills, no nausea vomiting or diarrhea.  Patient is on inhalers and nebulizers at home, which did not help with her breathing.  Patient follows pulmonology, Dr. Elsworth Soho outpatient.  Patient received Solu-Medrol and Lasix 80 mg in ED.   Hospital Course:   Acute respiratory failure with hypoxia secondary to Acute on chronic combined systolic and diastolic congestive heart failure ([place]), bronchitis with asthma exacerbation -Patient was placed on IV Lasix for diuresis with 60 mg every 12 hours  -I's and O's with 2 L negative balance  -2D echo showed EF of 55 to 60%, mild concentric LVH, right ventricular volume overload, improvement in EF from the previous echo (2D echo in 01/2018 had shown EF of 25 to 30% with diffuse hypokinesis) -Creatinine 2.2 at the time of discharge, close to her baseline -Resume oral Lasix 80 mg twice daily at the time of discharge  Active Problems: Acute asthma exacerbation with bronchitis -Patient was placed on IV Solu-Medrol, Pulmicort, Brovana, scheduled nebs, IV Zithromax -Transition to oral prednisone with taper.  Patient instructed to complete the taper and then resume 5 mg of oral prednisone given her history of renal transplant -Continue Breo Ellipta, doxycycline   History of CKD stage III/renal transplant -Continue mycophenolate, Prograf, sodium bicarb -Resume prednisone 5 mg daily after higher dose prednisone taper is  completed    Essential hypertension -Continue Lasix, Coreg,     Anemia  of chronic disease -H&H stable    Stroke ([place]), no residual deficits -Continue aspirin  Paroxysmal  atrial fibrillation ([place]) -Has pacemaker, follows at [facility]    Day of Discharge S: No acute complaints, wants to go home, no fevers or chills.  States she is feeling 95% better.  BP (!) 142/83 (BP Location: Right Arm)   Pulse 70   Temp 97.8 F (36.6 C) (Oral)   Resp (!) 24   Ht 5\' 5"  (1.651 m)   Wt 70.9 kg   SpO2 97%   BMI 26.01 kg/m   Physical Exam: General: Alert and awake oriented x3 not in any acute distress. HEENT: anicteric sclera, pupils reactive to light and accommodation CVS: S1-S2 clear no murmur rubs or gallops Chest: Scattered rhonchi, improving Abdomen: soft nontender, nondistended, normal bowel sounds Extremities: no cyanosis, clubbing or edema noted bilaterally Neuro: Cranial nerves II-XII intact, no focal neurological deficits    Get Medicines reviewed  and adjusted: Please take all your medications with you for your next visit with your Primary MD  Please request your Primary MD to go over all hospital tests and procedure/radiological results at the follow up. Please ask your Primary MD to get all Hospital records sent to his/her office.  If you experience worsening of your admission symptoms, develop shortness of breath, life threatening emergency, suicidal or homicidal thoughts you must seek medical attention immediately by calling 911 or calling your MD immediately  if symptoms less severe.  You must read complete instructions/literature along with all the possible adverse reactions/side effects for all the Medicines you take and that have been prescribed to you. Take any new Medicines after you have completely understood and accept all the possible adverse reactions/side effects.   Do not drive when taking pain medications.    Do not take more than prescribed Pain, Sleep and Anxiety Medications  Special Instructions: If you have smoked or chewed Tobacco  in the last 2 yrs please stop smoking, stop any regular Alcohol  and or any Recreational drug use.  Wear Seat belts while driving.  Please note  You were cared for by a hospitalist during your hospital stay. Once you are discharged, your primary care physician will handle any further medical issues. Please note that NO REFILLS for any discharge medications will be authorized once you are discharged, as it is imperative that you return to your primary care physician (or establish a relationship with a primary care physician if you do not have one) for your aftercare needs so that they can reassess your need for medications and monitor your lab values.   The results of significant diagnostics from this hospitalization (including imaging, microbiology, ancillary and laboratory) are listed below for reference.      Procedures/Studies:  DG Chest 1 View  Result Date: 10/23/2019 CLINICAL DATA:  Hypoxemia EXAM: CHEST  1 VIEW COMPARISON:  10/22/2019 FINDINGS: Cardiac shadow remains enlarged. Postsurgical changes are again seen. Occlusion device is noted overlying the left atrial appendage. Multiple coils are also noted within the left atrial appendage. Vascular congestive changes are again seen without significant interstitial edema. No sizable effusion is noted. IMPRESSION: Persistent vascular congestion.  No acute edema is noted. Electronically Signed   By: Inez Catalina M.D.   On: 10/23/2019 15:53   DG Chest Port 1 View  Result Date: 10/22/2019 CLINICAL DATA:  Shortness of breath, cough EXAM: PORTABLE CHEST 1 VIEW COMPARISON:  11/14/2018 FINDINGS: Cardiomegaly with left chest single lead pacer defibrillator and Watchman left atrial appendage occlusion device. Mild, diffuse interstitial pulmonary opacity. The visualized skeletal structures are unremarkable. IMPRESSION:  Cardiomegaly with mild, diffuse interstitial pulmonary opacity, likely edema. Electronically Signed   By: Eddie Candle M.D.   On: 10/22/2019 11:50   ECHOCARDIOGRAM COMPLETE  Result Date: 10/23/2019    ECHOCARDIOGRAM REPORT   Patient Name:   VONNA BRABSON Date of Exam: 10/23/2019 Medical Rec #:  [id number]     Height:       65.0 in Accession #:    [id number]    Weight:       152.1 lb Date of Birth:  Feb 09, 1946    BSA:          1.761 m Patient Age:    35 years      BP:           140/80 mmHg Patient Gender: F             HR:  70 bpm. Exam Location:  Inpatient Procedure: 2D Echo, Cardiac Doppler and Color Doppler Indications:    R94.31 Abnormal EKG  History:        Patient has prior history of Echocardiogram examinations, most                 recent 02/04/2018. CHF and Cardiomyopathy, Abnormal ECG, COPD and                 Stroke, Arrythmias:Atrial Fibrillation; Risk Factors:Sleep                 Apnea. Pulmonary embolus.  Sonographer:    Roseanna Rainbow RDCS Referring Phys: [id number] [place]  Sonographer Comments: Image acquisition challenging due to respiratory motion. IMPRESSIONS  1. Left ventricular ejection fraction, by estimation, is 55 to 60%. The left ventricle has normal function. The left ventricle has no regional wall motion abnormalities. There is mild concentric left ventricular hypertrophy. Left ventricular diastolic function could not be evaluated. There is the interventricular septum is flattened in diastole ('D' shaped left ventricle), consistent with right ventricular volume overload.  2. Right ventricular systolic function is low normal. The right ventricular size is moderately enlarged. There is moderately elevated pulmonary artery systolic pressure. The estimated right ventricular systolic pressure is 62.8 mmHg.  3. Left atrial size was severely dilated.  4. Right atrial size was severely dilated.  5. The mitral valve is normal in structure. Mild to moderate mitral valve regurgitation.  No evidence of mitral stenosis.  6. 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  7. The aortic valve is normal in structure. Aortic valve regurgitation is mild. No aortic stenosis is present.  8. The inferior vena cava is dilated in size with <50% respiratory variability, suggesting right atrial pressure of 15 mmHg. Comparison(s): Prior images reviewed side by side. The left ventricular function has improved. The right ventricular systolic function is unchanged. FINDINGS  Left Ventricle: Left ventricular ejection fraction, by estimation, is 55 to 60%. The left ventricle has normal function. The left ventricle has no regional wall motion abnormalities. The left ventricular internal cavity size was normal in size. There is  mild concentric left ventricular hypertrophy. The interventricular septum is flattened in diastole ('D' shaped left ventricle), consistent with right ventricular volume overload. Left ventricular diastolic function could not be evaluated due to atrial fibrillation. Left ventricular diastolic function could not be evaluated. Right Ventricle: The right ventricular size is moderately enlarged. No increase in right ventricular wall thickness. Right ventricular systolic function is low normal. There is moderately elevated pulmonary artery systolic pressure. The tricuspid regurgitant velocity is 3.38 m/s, and with an assumed right atrial pressure of 15 mmHg, the estimated right ventricular systolic pressure is 63.8 mmHg. Left Atrium: Left atrial size was severely dilated. Right Atrium: Right atrial size was severely dilated. Pericardium: There is no evidence of pericardial effusion. Mitral Valve: The mitral valve is normal in structure. Mild to moderate mitral valve regurgitation, with centrally-directed jet. No evidence of mitral valve stenosis. 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 The flow in the hepatic veins is  reversed during ventricular systole. Aortic Valve: The aortic valve is normal in structure. Aortic valve regurgitation is mild. No aortic stenosis is present. Pulmonic Valve: The pulmonic valve was normal in structure. Pulmonic valve regurgitation is not visualized. Aorta: The aortic root and ascending aorta are structurally normal, with no evidence of dilitation. Venous: The inferior vena cava is dilated  in size with less than 50% respiratory variability, suggesting right atrial pressure of 15 mmHg. IAS/Shunts: No atrial level shunt detected by color flow Doppler.  LEFT VENTRICLE PLAX 2D LVIDd:         5.40 cm     Diastology LVIDs:         3.72 cm     LV e' lateral:   10.30 cm/s LV PW:         1.42 cm     LV E/e' lateral: 10.0 LV IVS:        1.45 cm     LV e' medial:    7.62 cm/s LVOT diam:     1.80 cm     LV E/e' medial:  13.6 LV SV:         52 LV SV Index:   29 LVOT Area:     2.54 cm  LV Volumes (MOD) LV vol d, MOD A2C: 99.0 ml LV vol d, MOD A4C: 59.7 ml LV vol s, MOD A2C: 50.8 ml LV vol s, MOD A4C: 49.4 ml LV SV MOD A2C:     48.2 ml LV SV MOD A4C:     59.7 ml LV SV MOD BP:      32.2 ml RIGHT VENTRICLE             IVC RV S prime:     11.50 cm/s  IVC diam: 3.29 cm TAPSE (M-mode): 2.1 cm LEFT ATRIUM              Index       RIGHT ATRIUM           Index LA diam:        4.20 cm  2.39 cm/m  RA Area:     16.30 cm LA Vol (A2C):   91.9 ml  52.19 ml/m RA Volume:   38.60 ml  21.92 ml/m LA Vol (A4C):   99.6 ml  56.56 ml/m LA Biplane Vol: 104.0 ml 59.06 ml/m  AORTIC VALVE LVOT Vmax:   107.00 cm/s LVOT Vmean:  75.700 cm/s LVOT VTI:    0.204 m  AORTA Ao Root diam: 3.60 cm MITRAL VALVE                TRICUSPID VALVE MV Area (PHT): 4.25 cm     TR Peak grad:   45.7 mmHg MV Decel Time: 178 msec     TR Vmax:        338.00 cm/s MV E velocity: 103.50 cm/s                             SHUNTS                             Systemic VTI:  0.20 m                             Systemic Diam: 1.80 cm Dani Gobble Croitoru MD Electronically  signed by Sanda Klein MD Signature Date/Time: 10/23/2019/4:27:04 PM    Final        LAB RESULTS: Basic Metabolic Panel: Recent Labs  Lab 10/22/19 1325 10/23/19 0508 10/24/19 0527 10/25/19 0542  NA  --    < > 140 141  K  --    < > 3.7 3.6  CL  --    < > 97* 100  CO2  --    < > 29 31  GLUCOSE  --    < > 134* 122*  BUN  --    < > 59* 66*  CREATININE  --    < > 2.18* 2.20*  CALCIUM  --    < > 9.6 9.3  MG 1.7  --   --   --   PHOS 2.6  --   --   --    < > = values in this interval not displayed.   Liver Function Tests: Recent Labs  Lab 10/22/19 1116  AST 25  ALT 15  ALKPHOS 104  BILITOT 1.5*  PROT 7.7  ALBUMIN 4.0   No results for input(s): LIPASE, AMYLASE in the last 168 hours. No results for input(s): AMMONIA in the last 168 hours. CBC: Recent Labs  Lab 10/22/19 1116  WBC 12.7*  NEUTROABS 10.3*  HGB 10.9*  HCT 35.5*  MCV 83.3  PLT 218   Cardiac Enzymes: No results for input(s): CKTOTAL, CKMB, CKMBINDEX, TROPONINI in the last 168 hours. BNP: Invalid input(s): POCBNP CBG: No results for input(s): GLUCAP in the last 168 hours.     Disposition and Follow-up: Discharge Instructions    (HEART FAILURE PATIENTS) Call MD:  Anytime you have any of the following symptoms: 1) 3 pound weight gain in 24 hours or 5 pounds in 1 week 2) shortness of breath, with or without a dry hacking cough 3) swelling in the hands, feet or stomach 4) if you have to sleep on extra pillows at night in order to breathe.   Complete by: As directed    Diet - low sodium heart healthy   Complete by: As directed    Discharge instructions   Complete by: As directed    Prednisone dosing: Take  Prednisone 40mg  (4 tabs) x 2 days, then taper to 30mg  (3 tabs) x 3 days, then 20mg  (2 tabs) x 3days, then 10mg  (1 tab) x 3 days, then continue your maintenance dose of 5mg  daily   Increase activity slowly   Complete by: As directed        DISPOSITION: Home   DISCHARGE FOLLOW-UP Follow-up  Information    Lauraine Rinne, NP. Schedule an appointment as soon as possible for a visit in 10 day(s).   Specialty: Pulmonary Disease Contact information: [address] [place] [address] [phone]        [name], NP. Schedule an appointment as soon as possible for a visit in 2 week(s).   Specialty: Internal Medicine Contact information: [place] [address] [phone]            Time coordinating discharge:  35-minutes  Signed:   Estill Cotta M.D. [facility] 10/25/2019, 12:52 PM

## 2019-10-26 ENCOUNTER — Telehealth: Payer: Self-pay | Admitting: *Deleted

## 2019-10-26 NOTE — Telephone Encounter (Signed)
Transition Care Management Follow-up Telephone Call    Date discharged? 10/25/19   How have you been since you were released from the hospital? "I am not feeling 100%"   Do you understand why you were in the hospital? yes   Do you understand the discharge instructions? yes   Where were you discharged to? Home   Items Reviewed:  Medications reviewed: yes  Allergies reviewed: yes  Dietary changes reviewed: yes  Referrals reviewed: yes   Functional Questionnaire:   Activities of Daily Living (ADLs):   She states they are independent in the following: ambulation, bathing and hygiene, feeding, continence, grooming, toileting and dressing States they require assistance with the following: na   Any transportation issues/concerns?: no   Any patient concerns? no   Confirmed importance and date/time of follow-up visits scheduled yes  Provider Appointment booked with PCP 05/29/20  Confirmed with patient if condition begins to worsen call PCP or go to the ER.  Patient was given the office number and encouraged to call back with question or concerns.  : yes 10/25/19

## 2019-10-27 ENCOUNTER — Telehealth: Payer: Self-pay | Admitting: Nurse Practitioner

## 2019-10-27 DIAGNOSIS — I10 Essential (primary) hypertension: Secondary | ICD-10-CM

## 2019-10-27 DIAGNOSIS — J4531 Mild persistent asthma with (acute) exacerbation: Secondary | ICD-10-CM

## 2019-10-27 NOTE — Progress Notes (Signed)
  Chronic Care Management   Note  10/27/2019 Name: [name] MRN: [id number] DOB: November 05, 1945  [name] is a 74 y.o. year old female who is a primary care patient of [name], [name], NP. I reached out to [name] by phone today in response to a referral sent by [name] PCP, [name], [name], NP.   [name] was given information about Chronic Care Management services today including:  1. CCM service includes personalized support from designated clinical staff supervised by her physician, including individualized plan of care and coordination with other care providers 2. 24/7 contact phone numbers for assistance for urgent and routine care needs. 3. Service will only be billed when office clinical staff spend 20 minutes or more in a month to coordinate care. 4. Only one practitioner may furnish and bill the service in a calendar month. 5. The patient may stop CCM services at any time (effective at the end of the month) by phone call to the office staff.   Patient agreed to services and verbal consent obtained.   Follow up plan:   Earney [place] [employer] Scheduler

## 2019-10-28 ENCOUNTER — Telehealth: Payer: Self-pay | Admitting: Nurse Practitioner

## 2019-10-28 ENCOUNTER — Ambulatory Visit (INDEPENDENT_AMBULATORY_CARE_PROVIDER_SITE_OTHER): Payer: Medicare Other | Admitting: Nurse Practitioner

## 2019-10-28 ENCOUNTER — Ambulatory Visit (INDEPENDENT_AMBULATORY_CARE_PROVIDER_SITE_OTHER): Payer: Medicare Other

## 2019-10-28 ENCOUNTER — Other Ambulatory Visit: Payer: Self-pay

## 2019-10-28 ENCOUNTER — Encounter: Payer: Self-pay | Admitting: Nurse Practitioner

## 2019-10-28 VITALS — BP 132/80 | HR 72 | Temp 96.9°F | Ht 65.0 in | Wt 154.2 lb

## 2019-10-28 DIAGNOSIS — F419 Anxiety disorder, unspecified: Secondary | ICD-10-CM | POA: Diagnosis not present

## 2019-10-28 DIAGNOSIS — I0981 Rheumatic heart failure: Secondary | ICD-10-CM | POA: Diagnosis not present

## 2019-10-28 DIAGNOSIS — I5043 Acute on chronic combined systolic (congestive) and diastolic (congestive) heart failure: Secondary | ICD-10-CM

## 2019-10-28 DIAGNOSIS — B3783 Candidal cheilitis: Secondary | ICD-10-CM

## 2019-10-28 DIAGNOSIS — Z86718 Personal history of other venous thrombosis and embolism: Secondary | ICD-10-CM | POA: Diagnosis not present

## 2019-10-28 DIAGNOSIS — D631 Anemia in chronic kidney disease: Secondary | ICD-10-CM | POA: Diagnosis not present

## 2019-10-28 DIAGNOSIS — J9601 Acute respiratory failure with hypoxia: Secondary | ICD-10-CM | POA: Diagnosis not present

## 2019-10-28 DIAGNOSIS — I420 Dilated cardiomyopathy: Secondary | ICD-10-CM | POA: Diagnosis not present

## 2019-10-28 DIAGNOSIS — J45901 Unspecified asthma with (acute) exacerbation: Secondary | ICD-10-CM | POA: Diagnosis not present

## 2019-10-28 DIAGNOSIS — E213 Hyperparathyroidism, unspecified: Secondary | ICD-10-CM | POA: Diagnosis not present

## 2019-10-28 DIAGNOSIS — L409 Psoriasis, unspecified: Secondary | ICD-10-CM | POA: Diagnosis not present

## 2019-10-28 DIAGNOSIS — M109 Gout, unspecified: Secondary | ICD-10-CM | POA: Diagnosis not present

## 2019-10-28 DIAGNOSIS — L309 Dermatitis, unspecified: Secondary | ICD-10-CM | POA: Diagnosis not present

## 2019-10-28 DIAGNOSIS — Z8673 Personal history of transient ischemic attack (TIA), and cerebral infarction without residual deficits: Secondary | ICD-10-CM | POA: Diagnosis not present

## 2019-10-28 DIAGNOSIS — Z940 Kidney transplant status: Secondary | ICD-10-CM | POA: Diagnosis not present

## 2019-10-28 DIAGNOSIS — I4819 Other persistent atrial fibrillation: Secondary | ICD-10-CM | POA: Diagnosis not present

## 2019-10-28 DIAGNOSIS — J441 Chronic obstructive pulmonary disease with (acute) exacerbation: Secondary | ICD-10-CM

## 2019-10-28 DIAGNOSIS — F322 Major depressive disorder, single episode, severe without psychotic features: Secondary | ICD-10-CM

## 2019-10-28 DIAGNOSIS — N183 Chronic kidney disease, stage 3 unspecified: Secondary | ICD-10-CM | POA: Diagnosis not present

## 2019-10-28 DIAGNOSIS — I130 Hypertensive heart and chronic kidney disease with heart failure and stage 1 through stage 4 chronic kidney disease, or unspecified chronic kidney disease: Secondary | ICD-10-CM | POA: Diagnosis not present

## 2019-10-28 DIAGNOSIS — G4733 Obstructive sleep apnea (adult) (pediatric): Secondary | ICD-10-CM | POA: Diagnosis not present

## 2019-10-28 DIAGNOSIS — J209 Acute bronchitis, unspecified: Secondary | ICD-10-CM | POA: Diagnosis not present

## 2019-10-28 DIAGNOSIS — I081 Rheumatic disorders of both mitral and tricuspid valves: Secondary | ICD-10-CM | POA: Diagnosis not present

## 2019-10-28 DIAGNOSIS — I739 Peripheral vascular disease, unspecified: Secondary | ICD-10-CM | POA: Diagnosis not present

## 2019-10-28 DIAGNOSIS — F329 Major depressive disorder, single episode, unspecified: Secondary | ICD-10-CM | POA: Diagnosis not present

## 2019-10-28 DIAGNOSIS — R0989 Other specified symptoms and signs involving the circulatory and respiratory systems: Secondary | ICD-10-CM | POA: Diagnosis not present

## 2019-10-28 IMAGING — DX DG CHEST 2V
2 series · 2 of 2 positions shown · non-contrast
Comparison: [DATE].

CLINICAL DATA: Pulmonary congestion.

EXAM:
CHEST - 2 VIEW

[chest pa]
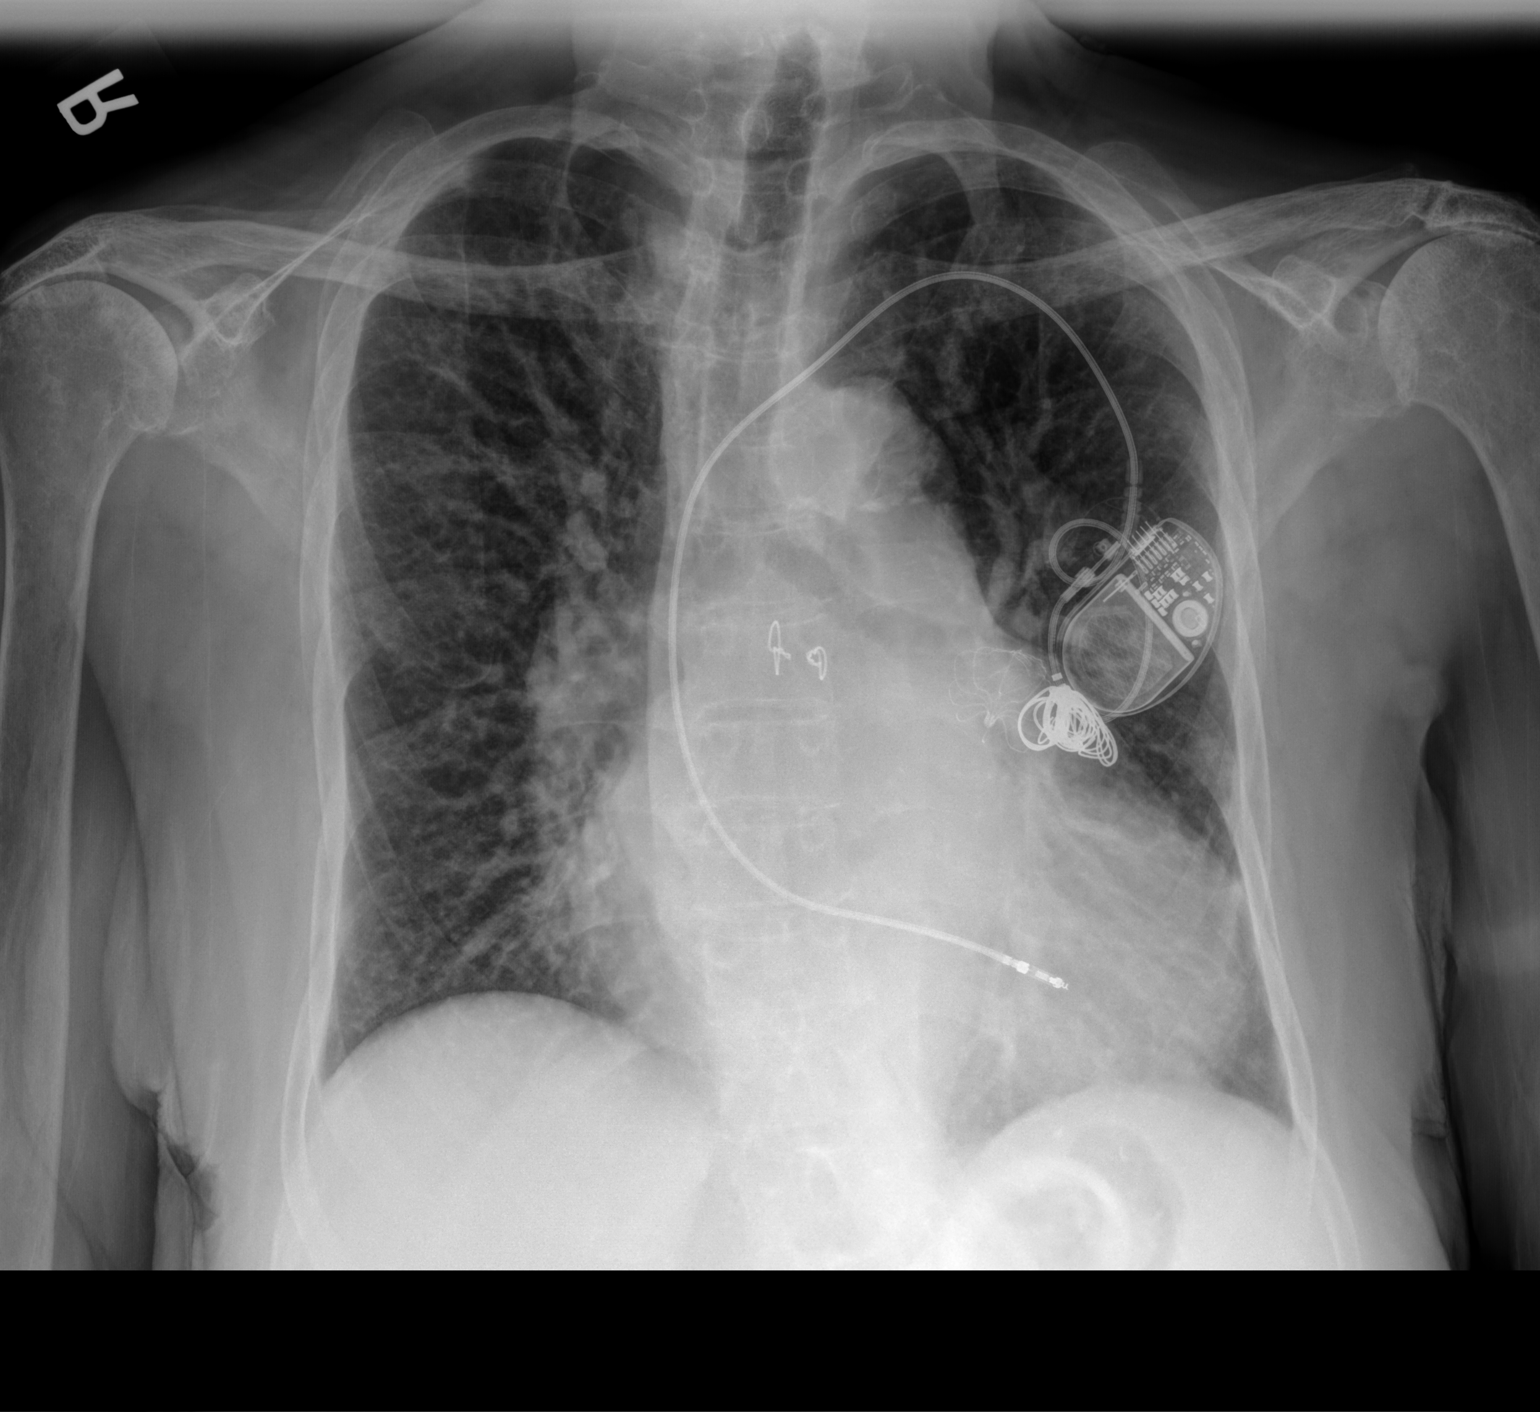

[chest lat]
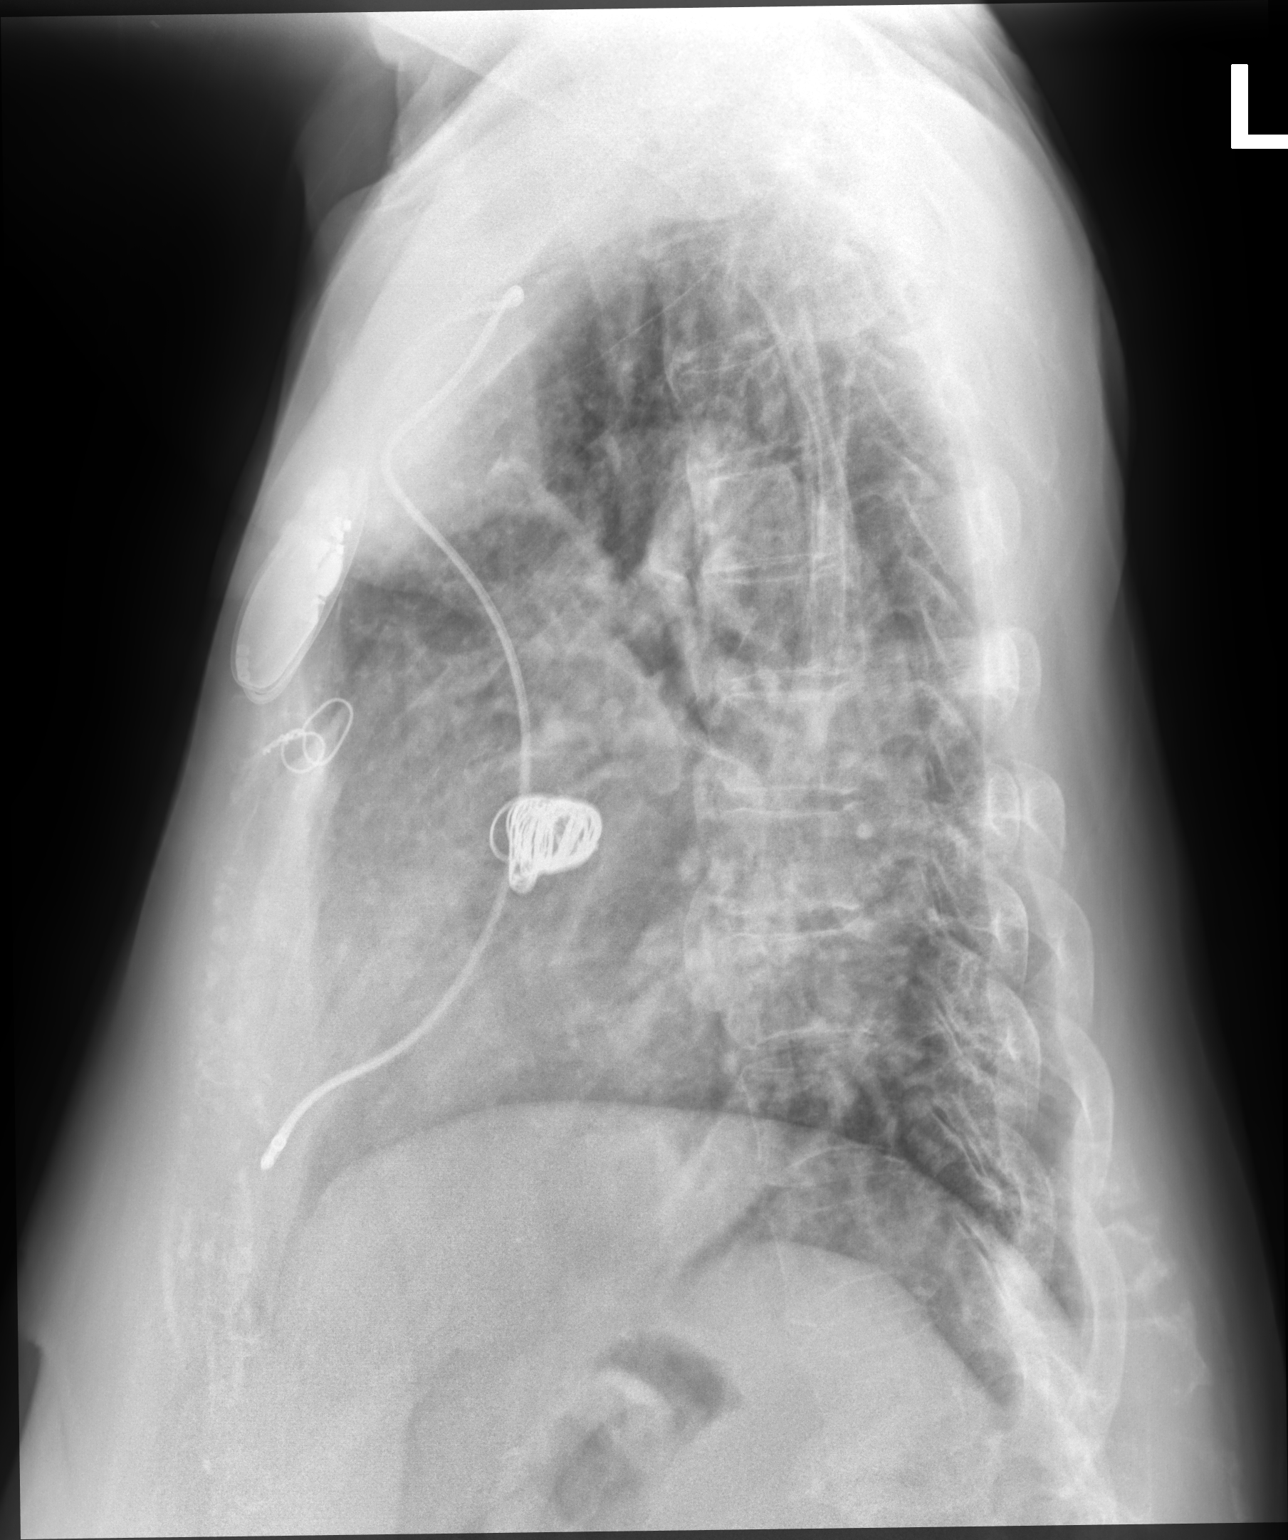

[2 of 2 positions shown; findings below may reference images not displayed]

FINDINGS: Stable cardiomegaly. Central pulmonary vascular congestion is noted.
Left-sided pacemaker is unchanged in position. No consolidative
process is noted. No pneumothorax or pleural effusion is noted. Bony
thorax is unremarkable.
IMPRESSION: Stable cardiomegaly with central pulmonary vascular congestion.

## 2019-10-28 MED ORDER — DULOXETINE HCL 30 MG PO CPEP: 30.0000 mg | ORAL_CAPSULE | Freq: Every day | ORAL | 5 refills | Status: DC

## 2019-10-28 MED ORDER — NYSTATIN 100000 UNIT/ML MT SUSP: 5.0000 mL | Freq: Four times a day (QID) | OROMUCOSAL | 1 refills | Status: DC

## 2019-10-28 NOTE — Telephone Encounter (Signed)
Amber is calling to get verbal orders for physical therapy. Orders are 1x1 week , 2x3 a week and 1x4 a week. CB is [phone]

## 2019-10-28 NOTE — Telephone Encounter (Signed)
LVM to call back Inform [name] that I have changed zoloft to cymbalta to help with depression. She need to stop zloft and pick up new rx of cymbalta Thank you

## 2019-10-28 NOTE — Progress Notes (Signed)
Subjective:  Patient ID: [name], female    DOB: Oct 25, 1945  Age: 74 y.o. MRN: [id number]  CC: Hospitalization Follow-up (COPD-breathing is better but still some SOB//colonoscopy Dr. Collene Mares last year)   HPI Hospitalization: 10/22/2019 to 04/11 TCM call completed 10/26/2019 Accompanied by her husband. [name] was admitted with COPD and CHF exacerbation. She was treated with IV abx, albuterol, pulmicort, furosemide and solumedrol. She was discharge with oral prednisone taper and oral abx. I reviewed discharge summary, lab results and radiology report. Today she reports improved SOB, but persistent cough and worsening depressive symptoms. She admits to excessive fluid and sodium intake. Use of mucinex, hycodan and benzonatate for cough with moderate relief.  Depression: Chronic, worsening, symptoms (lack of interest, lack of energy, generalized pain, lack of appetite), Current use of zoloft, ongoing CBT session 2x/month. Denies SI or HI.  Reviewed and reconciled medication list.  Outpatient Medications Prior to Visit  Medication Sig Dispense Refill  . acetaminophen (TYLENOL) 500 MG tablet Take 500 mg by mouth every 6 (six) hours as needed.    Marland Kitchen albuterol (PROVENTIL) (2.5 MG/3ML) 0.083% nebulizer solution Take 3 mLs (2.5 mg total) by nebulization every 6 (six) hours as needed for wheezing or shortness of breath. Dx:J45.909 150 mL 2  . aspirin EC 81 MG tablet Take 81 mg by mouth daily.     . benzonatate (TESSALON) 100 MG capsule Take 1 capsule (100 mg total) by mouth 3 (three) times daily as needed for cough. 30 capsule 0  . carvedilol (COREG) 12.5 MG tablet Take 1 tablet (12.5 mg total) by mouth 2 (two) times daily. 60 tablet 3  . docusate sodium (COLACE) 100 MG capsule Take 100 mg by mouth daily as needed (For constipation.).     Marland Kitchen doxycycline (VIBRA-TABS) 100 MG tablet Take 1 tablet (100 mg total) by mouth 2 (two) times daily for 7 days. 14 tablet 0  . Ferrous Sulfate (IRON PO) Take  325 mg by mouth daily.     . fluticasone furoate-vilanterol (BREO ELLIPTA) 100-25 MCG/INH AEPB Inhale 1 puff into the lungs daily. 60 each 5  . furosemide (LASIX) 80 MG tablet Take 1 tablet (80 mg total) by mouth 2 (two) times daily. 60 tablet 3  . HYDROcodone-homatropine (HYCODAN) 5-1.5 MG/5ML syrup Take 5 mLs by mouth every 8 (eight) hours as needed for cough. 120 mL 0  . Magnesium 400 MG TABS Take by mouth 2 (two) times daily.    . magnesium oxide (MAG-OX) 400 MG tablet Take 2 tablets by mouth 2 (two) times daily.    . mycophenolate (MYFORTIC) 180 MG EC tablet Take 180 mg by mouth 2 (two) times daily.    . potassium chloride SA (K-DUR,KLOR-CON) 20 MEQ tablet Take 20 mEq by mouth daily.     . predniSONE (DELTASONE) 5 MG tablet Take 1 tablet (5 mg total) by mouth daily with breakfast. Please resume after you have completed prednisone taper    . tacrolimus (PROGRAF) 1 MG capsule Take 2 mg by mouth 2 (two) times daily.     . sertraline (ZOLOFT) 100 MG tablet Take 1 tablet (100 mg total) by mouth daily. 90 tablet 3  . SODIUM BICARBONATE PO Take by mouth. 10 grain--taking 2 tab PO BID     No facility-administered medications prior to visit.   ROS See HPI  Objective:  BP 132/80   Pulse 72   Temp (!) 96.9 F (36.1 C) (Tympanic)   Ht 5\' 5"  (1.651 m)  Wt 154 lb 3.2 oz (69.9 kg)   SpO2 97%   BMI 25.66 kg/m   BP Readings from Last 3 Encounters:  10/28/19 132/80  10/25/19 (!) 142/83  10/22/19 130/88    Wt Readings from Last 3 Encounters:  10/28/19 154 lb 3.2 oz (69.9 kg)  10/25/19 156 lb 4.9 oz (70.9 kg)  10/22/19 150 lb 6.4 oz (68.2 kg)    Physical Exam Cardiovascular:     Rate and Rhythm: Normal rate and regular rhythm.     Pulses: Normal pulses.     Heart sounds: Murmur present.  Pulmonary:     Effort: Pulmonary effort is normal.     Breath sounds: Rales present. No wheezing.  Musculoskeletal:     Right lower leg: No edema.     Left lower leg: No edema.  Neurological:      Mental Status: She is oriented to person, place, and time.  Psychiatric:        Attention and Perception: Attention normal.        Mood and Affect: Mood is depressed.        Speech: Speech normal.        Behavior: Behavior is cooperative.        Thought Content: Thought content is not delusional. Thought content does not include homicidal or suicidal ideation. Thought content does not include homicidal or suicidal plan.        Cognition and Memory: Cognition normal.        Judgment: Judgment normal.     Lab Results  Component Value Date   WBC 12.7 (H) 10/22/2019   HGB 10.9 (L) 10/22/2019   HCT 35.5 (L) 10/22/2019   PLT 218 10/22/2019   GLUCOSE 122 (H) 10/25/2019   CHOL  11/16/2008    184        ATP III CLASSIFICATION:  <200     mg/dL   Desirable  200-239  mg/dL   Borderline High  >=240    mg/dL   High          TRIG 157 (H) 09/15/2011   HDL 33 (L) 11/16/2008   LDLCALC (H) 11/16/2008    130        Total Cholesterol/HDL:CHD Risk Coronary Heart Disease Risk Table                     Men   Women  1/2 Average Risk   3.4   3.3  Average Risk       5.0   4.4  2 X Average Risk   9.6   7.1  3 X Average Risk  23.4   11.0        Use the calculated Patient Ratio above and the CHD Risk Table to determine the patient's CHD Risk.        ATP III CLASSIFICATION (LDL):  <100     mg/dL   Optimal  100-129  mg/dL   Near or Above                    Optimal  130-159  mg/dL   Borderline  160-189  mg/dL   High  >190     mg/dL   Very High   ALT 15 10/22/2019   AST 25 10/22/2019   NA 141 10/25/2019   K 3.6 10/25/2019   CL 100 10/25/2019   CREATININE 2.20 (H) 10/25/2019   BUN 66 (H) 10/25/2019   CO2 31 10/25/2019   TSH  2.655 09/11/2011   INR 1.90 11/03/2017   HGBA1C  11/16/2008    5.5 (NOTE) The ADA recommends the following therapeutic goal for glycemic control related to Hgb A1c measurement: Goal of therapy: <6.5 Hgb A1c  Reference: American Diabetes Association: Clinical Practice  Recommendations 2010, Diabetes Care, 2010, 33: (Suppl  1).    DG Chest 1 View  Result Date: 10/23/2019 CLINICAL DATA:  Hypoxemia EXAM: CHEST  1 VIEW COMPARISON:  10/22/2019 FINDINGS: Cardiac shadow remains enlarged. Postsurgical changes are again seen. Occlusion device is noted overlying the left atrial appendage. Multiple coils are also noted within the left atrial appendage. Vascular congestive changes are again seen without significant interstitial edema. No sizable effusion is noted. IMPRESSION: Persistent vascular congestion.  No acute edema is noted. Electronically Signed   By: Inez Catalina M.D.   On: 10/23/2019 15:53   DG Chest Port 1 View  Result Date: 10/22/2019 CLINICAL DATA:  Shortness of breath, cough EXAM: PORTABLE CHEST 1 VIEW COMPARISON:  11/14/2018 FINDINGS: Cardiomegaly with left chest single lead pacer defibrillator and Watchman left atrial appendage occlusion device. Mild, diffuse interstitial pulmonary opacity. The visualized skeletal structures are unremarkable. IMPRESSION: Cardiomegaly with mild, diffuse interstitial pulmonary opacity, likely edema. Electronically Signed   By: Eddie Candle M.D.   On: 10/22/2019 11:50   ECHOCARDIOGRAM COMPLETE  Result Date: 10/23/2019    ECHOCARDIOGRAM REPORT   Patient Name:   CONSANDRA LASKE Date of Exam: 10/23/2019 Medical Rec #:  [id number]     Height:       65.0 in Accession #:    [id number]    Weight:       152.1 lb Date of Birth:  [birth year]    BSA:          1.761 m Patient Age:    56 years      BP:           140/80 mmHg Patient Gender: F             HR:           70 bpm. Exam Location:  Inpatient Procedure: 2D Echo, Cardiac Doppler and Color Doppler Indications:    R94.31 Abnormal EKG  History:        Patient has prior history of Echocardiogram examinations, most                 recent 02/04/2018. CHF and Cardiomyopathy, Abnormal ECG, COPD and                 Stroke, Arrythmias:Atrial Fibrillation; Risk Factors:Sleep                 Apnea. Pulmonary  embolus.  Sonographer:    Roseanna Rainbow RDCS Referring Phys: [id number] [place]  Sonographer Comments: Image acquisition challenging due to respiratory motion. IMPRESSIONS  1. Left ventricular ejection fraction, by estimation, is 55 to 60%. The left ventricle has normal function. The left ventricle has no regional wall motion abnormalities. There is mild concentric left ventricular hypertrophy. Left ventricular diastolic function could not be evaluated. There is the interventricular septum is flattened in diastole ('D' shaped left ventricle), consistent with right ventricular volume overload.  2. Right ventricular systolic function is low normal. The right ventricular size is moderately enlarged. There is moderately elevated pulmonary artery systolic pressure. The estimated right ventricular systolic pressure is 25.3 mmHg.  3. Left atrial size was severely dilated.  4. Right atrial size was severely dilated.  5. The mitral valve  is normal in structure. Mild to moderate mitral valve regurgitation. No evidence of mitral stenosis.  6. 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  7. The aortic valve is normal in structure. Aortic valve regurgitation is mild. No aortic stenosis is present.  8. The inferior vena cava is dilated in size with <50% respiratory variability, suggesting right atrial pressure of 15 mmHg. Comparison(s): Prior images reviewed side by side. The left ventricular function has improved. The right ventricular systolic function is unchanged. FINDINGS  Left Ventricle: Left ventricular ejection fraction, by estimation, is 55 to 60%. The left ventricle has normal function. The left ventricle has no regional wall motion abnormalities. The left ventricular internal cavity size was normal in size. There is  mild concentric left ventricular hypertrophy. The interventricular septum is flattened in diastole ('D' shaped left ventricle), consistent with right ventricular volume  overload. Left ventricular diastolic function could not be evaluated due to atrial fibrillation. Left ventricular diastolic function could not be evaluated. Right Ventricle: The right ventricular size is moderately enlarged. No increase in right ventricular wall thickness. Right ventricular systolic function is low normal. There is moderately elevated pulmonary artery systolic pressure. The tricuspid regurgitant velocity is 3.38 m/s, and with an assumed right atrial pressure of 15 mmHg, the estimated right ventricular systolic pressure is 75.1 mmHg. Left Atrium: Left atrial size was severely dilated. Right Atrium: Right atrial size was severely dilated. Pericardium: There is no evidence of pericardial effusion. Mitral Valve: The mitral valve is normal in structure. Mild to moderate mitral valve regurgitation, with centrally-directed jet. No evidence of mitral valve stenosis. 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 The flow in the hepatic veins is reversed during ventricular systole. Aortic Valve: The aortic valve is normal in structure. Aortic valve regurgitation is mild. No aortic stenosis is present. Pulmonic Valve: The pulmonic valve was normal in structure. Pulmonic valve regurgitation is not visualized. Aorta: The aortic root and ascending aorta are structurally normal, with no evidence of dilitation. Venous: The inferior vena cava is dilated in size with less than 50% respiratory variability, suggesting right atrial pressure of 15 mmHg. IAS/Shunts: No atrial level shunt detected by color flow Doppler.  LEFT VENTRICLE PLAX 2D LVIDd:         5.40 cm     Diastology LVIDs:         3.72 cm     LV e' lateral:   10.30 cm/s LV PW:         1.42 cm     LV E/e' lateral: 10.0 LV IVS:        1.45 cm     LV e' medial:    7.62 cm/s LVOT diam:     1.80 cm     LV E/e' medial:  13.6 LV SV:         52 LV SV Index:   29 LVOT Area:     2.54 cm  LV Volumes (MOD) LV vol d, MOD  A2C: 99.0 ml LV vol d, MOD A4C: 59.7 ml LV vol s, MOD A2C: 50.8 ml LV vol s, MOD A4C: 49.4 ml LV SV MOD A2C:     48.2 ml LV SV MOD A4C:     59.7 ml LV SV MOD BP:      32.2 ml RIGHT VENTRICLE             IVC RV S prime:     11.50 cm/s  IVC diam:  3.29 cm TAPSE (M-mode): 2.1 cm LEFT ATRIUM              Index       RIGHT ATRIUM           Index LA diam:        4.20 cm  2.39 cm/m  RA Area:     16.30 cm LA Vol (A2C):   91.9 ml  52.19 ml/m RA Volume:   38.60 ml  21.92 ml/m LA Vol (A4C):   99.6 ml  56.56 ml/m LA Biplane Vol: 104.0 ml 59.06 ml/m  AORTIC VALVE LVOT Vmax:   107.00 cm/s LVOT Vmean:  75.700 cm/s LVOT VTI:    0.204 m  AORTA Ao Root diam: 3.60 cm MITRAL VALVE                TRICUSPID VALVE MV Area (PHT): 4.25 cm     TR Peak grad:   45.7 mmHg MV Decel Time: 178 msec     TR Vmax:        338.00 cm/s MV E velocity: 103.50 cm/s                             SHUNTS                             Systemic VTI:  0.20 m                             Systemic Diam: 1.80 cm Dani Gobble Croitoru MD Electronically signed by Sanda Klein MD Signature Date/Time: 10/23/2019/4:27:04 PM    Final     Assessment & Plan:  This visit occurred during the SARS-CoV-2 public health emergency.  Safety protocols were in place, including screening questions prior to the visit, additional usage of staff PPE, and extensive cleaning of exam room while observing appropriate contact time as indicated for disinfecting solutions.   Alayiah was seen today for hospitalization follow-up.  Diagnoses and all orders for this visit:  Acute on chronic combined systolic and diastolic congestive heart failure (HCC) -     DG Chest 2 View  COPD exacerbation (HCC) -     DG Chest 2 View  Candidal cheilitis -     nystatin (MYCOSTATIN) 100000 UNIT/ML suspension; Take 5 mLs (500,000 Units total) by mouth 4 (four) times daily.  Current severe episode of major depressive disorder without psychotic features without prior episode (HCC) -     DULoxetine  (CYMBALTA) 30 MG capsule; Take 1 capsule (30 mg total) by mouth daily. (Patient not taking: Reported on 10/30/2019)  Discontinue zoloft and start cymbalta. Continue CBT sessions. Stable CXR with persistent vascular congestion. Schedule f/up appt with cardiology. Continue Home PT and OT. I spent 61mins with discussing the importance of fluid and sodium intake compliance, daily weight check, and small frequent meals. She verbalized understanding  I have discontinued Necola T. Segovia's sertraline. I am also having her start on nystatin and DULoxetine. Additionally, I am having her maintain her docusate sodium, aspirin EC, mycophenolate, acetaminophen, potassium chloride SA, Magnesium, tacrolimus, Ferrous Sulfate (IRON PO), SODIUM BICARBONATE PO, Breo Ellipta, doxycycline, predniSONE, albuterol, benzonatate, HYDROcodone-homatropine, furosemide, carvedilol, and magnesium oxide.  Meds ordered this encounter  Medications  . nystatin (MYCOSTATIN) 100000 UNIT/ML suspension    Sig: Take 5 mLs (500,000 Units total) by mouth 4 (four) times daily.  Dispense:  473 mL    Refill:  1    Order Specific Question:   Supervising Provider    Answer:   Ronnald Nian [[id number]]  . DULoxetine (CYMBALTA) 30 MG capsule    Sig: Take 1 capsule (30 mg total) by mouth daily.    Dispense:  30 capsule    Refill:  5    Discontinue zoloft    Order Specific Question:   Supervising Provider    Answer:   Ronnald Nian [[id number]]    Problem List Items Addressed This Visit      Cardiovascular and Mediastinum   Acute on chronic combined systolic and diastolic congestive heart failure ([place]) - Primary   Relevant Orders   DG Chest 2 View (Completed)     Respiratory   COPD exacerbation ([place])   Relevant Orders   DG Chest 2 View (Completed)     Other   Depression   Relevant Medications   DULoxetine (CYMBALTA) 30 MG capsule    Other Visit Diagnoses    Candidal cheilitis       Relevant Medications   nystatin  (MYCOSTATIN) 100000 UNIT/ML suspension      Follow-up: Return in about 4 weeks (around 11/25/2019) for depression (video appt, 2mins).  Wilfred Lacy, NP

## 2019-10-28 NOTE — Telephone Encounter (Signed)
Pt was notified and verbally understood.

## 2019-10-28 NOTE — Patient Instructions (Addendum)
We will obtain last colonoscopy report from Dr. Collene Mares.  Discontinue zoloft and start cymbalta. Continue CBT sessions. Stable CXR with persistent vascular congestion. Schedule f/up appt with cardiology. Continue Home PT and OT. I spent 25mins with discussing the importance of fluid and sodium intake compliance, daily weight check, and small frequent meals. She verbalized understanding.

## 2019-10-29 DIAGNOSIS — I081 Rheumatic disorders of both mitral and tricuspid valves: Secondary | ICD-10-CM | POA: Diagnosis not present

## 2019-10-29 DIAGNOSIS — I5043 Acute on chronic combined systolic (congestive) and diastolic (congestive) heart failure: Secondary | ICD-10-CM | POA: Diagnosis not present

## 2019-10-29 DIAGNOSIS — D631 Anemia in chronic kidney disease: Secondary | ICD-10-CM | POA: Diagnosis not present

## 2019-10-29 DIAGNOSIS — I0981 Rheumatic heart failure: Secondary | ICD-10-CM | POA: Diagnosis not present

## 2019-10-29 DIAGNOSIS — N183 Chronic kidney disease, stage 3 unspecified: Secondary | ICD-10-CM | POA: Diagnosis not present

## 2019-10-29 DIAGNOSIS — I130 Hypertensive heart and chronic kidney disease with heart failure and stage 1 through stage 4 chronic kidney disease, or unspecified chronic kidney disease: Secondary | ICD-10-CM | POA: Diagnosis not present

## 2019-10-29 NOTE — Telephone Encounter (Signed)
ok 

## 2019-10-29 NOTE — Telephone Encounter (Signed)
[name] is calling from [place] for patient to get OT orders. CB is [phone]

## 2019-10-29 NOTE — Telephone Encounter (Signed)
Left Amber a message that it was ok on a secure line and if she needed more to call back.

## 2019-10-29 NOTE — Telephone Encounter (Signed)
Charlotte please advise.  Amber is calling to get verbal orders for physical therapy. Orders are 1x1 week , 2x3 a week and 1x4 a week. CB is [phone]  Erlene Quan is also calling for OT orders.

## 2019-10-29 NOTE — Addendum Note (Signed)
Addended by: Lucila Maine on: 10/29/2019 03:10 PM   Modules accepted: Orders

## 2019-10-30 ENCOUNTER — Ambulatory Visit: Payer: Medicare Other

## 2019-10-30 DIAGNOSIS — J4531 Mild persistent asthma with (acute) exacerbation: Secondary | ICD-10-CM

## 2019-10-30 DIAGNOSIS — I5043 Acute on chronic combined systolic (congestive) and diastolic (congestive) heart failure: Secondary | ICD-10-CM

## 2019-10-30 NOTE — Telephone Encounter (Signed)
10/30/2019  Patient needs follow-up with our office.  Due to recent hospitalization for COPD exacerbation.  This needs to be an in office visit.  Can be with Dr. Elsworth Soho or an APP.  Wyn Quaker, FNP

## 2019-10-30 NOTE — Telephone Encounter (Signed)
Patient has been scheduled for a HFU on 4/21 at 1030am.

## 2019-10-30 NOTE — Patient Instructions (Signed)
Visit Information It was great speaking with you today!  Please let me know if you have any questions about our visit.  Goals Addressed            This Visit's Progress   . Chronic Care Management       CARE PLAN ENTRY  Current Barriers:  . Chronic Disease Management support, education, and care coordination needs related to Hypertension, atrial fibrillation, heart failure, asthma, COPD, and renal transplan  Clinical Goal(s): Over the next 30 days, patient will:  . Work with the care management team to address educational, disease management, and care coordination needs  . Begin or continue self health monitoring activities as directed today  . Call provider office for new or worsened signs and symptoms  . Call care management team with questions or concerns . Maintain blood pressure less than 130/80  . Prevent future asthma and COPD exacerbations  . Maintain stable weight   Interventions:  . Evaluation of current treatment plans and patient's adherence to plan as established by provider . Assessed patient understanding of disease states . Assessed patient's education and care coordination needs . Provided disease specific education to patient   Patient Self Care Activities:  . Patient agrees to weigh herself daily and record the results  . Patient agrees to monitor her blood pressure daily and record the results  . Patient agrees to call the [facility] counselors [phone]) to see if she qualifies for the Extra Help Program . Patient will be sure to rinse her mouth after every time she uses her inhaler.   Face to Face appointment with pharmacist scheduled for:  11/17/19 at 9:30 AM        [name] was given information about Chronic Care Management services today including:  1. CCM service includes personalized support from designated clinical staff supervised by her physician, including individualized plan of care and coordination with other care providers 2. 24/7 contact  phone numbers for assistance for urgent and routine care needs. 3. Standard insurance, coinsurance, copays and deductibles apply for chronic care management only during months in which we provide at least 20 minutes of these services. Most insurances cover these services at 100%, however patients may be responsible for any copay, coinsurance and/or deductible if applicable. This service may help you avoid the need for more expensive face-to-face services. 4. Only one practitioner may furnish and bill the service in a calendar month. 5. The patient may stop CCM services at any time (effective at the end of the month) by phone call to the office staff.  Patient agreed to services and verbal consent obtained.   The patient verbalized understanding of instructions provided today and agreed to receive a mailed copy of patient instruction and/or educational materials. Face to Face appointment with pharmacist scheduled for: 11/17/19  Doristine Section [phone]

## 2019-10-30 NOTE — Chronic Care Management (AMB) (Signed)
Chronic Care Management Pharmacy  Name: [name]  MRN: [id number] DOB: 1945-10-29  Chief Complaint/ HPI  [name],  74 y.o. , female presents for their Initial CCM visit with the clinical pharmacist via telephone due to COVID-19 Pandemic.  PCP : [name], NP  Their chronic conditions include: Hypertension, atrial fibrillation, heart failure, asthma, COPD, and renal transplant   Office Visits: 10/28/19: Patient presented to [facility] for hospital follow-up. Patient reported no change with mood symptoms. Sertraline discontinued, started on Duloxetine. Patient started on Nystatin for Candidal cheilitis. 10/22/19: Video visit with [facility] for shortness of breath. Patient with 4 weeks of cough, shortness of breath, and wheezing not relieved by inhaler use. Patient advised to present to ED. 05/29/19: Patient presented to [facility] for depression follow-up. PHQ9 score12, GAD7 score 8. Patient report no change in mood on sertraline, not interested in changing medications at this time.   Consult Visit: 10/22/19-10/25/19: Patient admitted to [facility] for COPD exacerbation and HF exacerbation. Patient presented with progressively worsening shortness of breath and cough. Patient placed on IV diuresis, IV solu-Medrol, IV antibiotics. Patient discharged on prednisone taper x 11 days.  07/21/19: Patient presented to Dr. Rosana Fret [facility]) for renal transplant follow-up.   Medications: Outpatient Encounter Medications as of 10/30/2019  Medication Sig  . acetaminophen (TYLENOL) 500 MG tablet Take 500 mg by mouth every 6 (six) hours as needed.  Marland Kitchen albuterol (PROVENTIL) (2.5 MG/3ML) 0.083% nebulizer solution Take 3 mLs (2.5 mg total) by nebulization every 6 (six) hours as needed for wheezing or shortness of breath. Dx:J45.909  . aspirin EC 81 MG tablet Take 81 mg by mouth daily.   . benzonatate (TESSALON) 100 MG capsule Take 1  capsule (100 mg total) by mouth 3 (three) times daily as needed for cough.  . carvedilol (COREG) 12.5 MG tablet Take 1 tablet (12.5 mg total) by mouth 2 (two) times daily.  Marland Kitchen docusate sodium (COLACE) 100 MG capsule Take 100 mg by mouth daily as needed (For constipation.).   Marland Kitchen doxycycline (VIBRA-TABS) 100 MG tablet Take 1 tablet (100 mg total) by mouth 2 (two) times daily for 7 days.  . Ferrous Sulfate (IRON PO) Take 325 mg by mouth daily.   . fluticasone furoate-vilanterol (BREO ELLIPTA) 100-25 MCG/INH AEPB Inhale 1 puff into the lungs daily.  . furosemide (LASIX) 80 MG tablet Take 1 tablet (80 mg total) by mouth 2 (two) times daily.  Marland Kitchen HYDROcodone-homatropine (HYCODAN) 5-1.5 MG/5ML syrup Take 5 mLs by mouth every 8 (eight) hours as needed for cough.  . Iron-Vitamin C (VITRON-C) 65-125 MG TABS Take 1 tablet by mouth daily.  . magnesium oxide (MAG-OX) 400 MG tablet Take 2 tablets by mouth 2 (two) times daily.  . mycophenolate (MYFORTIC) 180 MG EC tablet Take 180 mg by mouth 2 (two) times daily.  Marland Kitchen nystatin (MYCOSTATIN) 100000 UNIT/ML suspension Take 5 mLs (500,000 Units total) by mouth 4 (four) times daily.  . potassium chloride SA (K-DUR,KLOR-CON) 20 MEQ tablet Take 20 mEq by mouth daily.   . predniSONE (DELTASONE) 5 MG tablet Take 1 tablet (5 mg total) by mouth daily with breakfast. Please resume after you have completed prednisone taper  . tacrolimus (PROGRAF) 1 MG capsule Take 2 mg by mouth 2 (two) times daily.   . DULoxetine (CYMBALTA) 30 MG capsule Take 1 capsule (30 mg total) by mouth daily. (Patient not taking: Reported on 10/30/2019)  . Magnesium 400 MG TABS  Take by mouth 2 (two) times daily.  . SODIUM BICARBONATE PO Take by mouth. 10 grain--taking 2 tab PO BID   No facility-administered encounter medications on file as of 10/30/2019.    SDOH Interventions     Most Recent Value  SDOH Interventions  SDOH Interventions for the Following Domains  Financial Strain  Financial Strain  Interventions  Other (Comment) [[facility] Referral for Extra Help Program]     Current Diagnosis/Assessment:  Goals Addressed            This Visit's Progress   . Chronic Care Management       CARE PLAN ENTRY  Current Barriers:  . Chronic Disease Management support, education, and care coordination needs related to Hypertension, atrial fibrillation, heart failure, asthma, COPD, and renal transplan  Clinical Goal(s): Over the next 30 days, patient will:  . Work with the care management team to address educational, disease management, and care coordination needs  . Begin or continue self health monitoring activities as directed today  . Call provider office for new or worsened signs and symptoms  . Call care management team with questions or concerns . Maintain blood pressure less than 130/80  . Prevent future asthma and COPD exacerbations  . Maintain stable weight   Interventions:  . Evaluation of current treatment plans and patient's adherence to plan as established by provider . Assessed patient understanding of disease states . Assessed patient's education and care coordination needs . Provided disease specific education to patient   Patient Self Care Activities:  . Patient agrees to weigh herself daily and record the results  . Patient agrees to monitor her blood pressure daily and record the results  . Patient agrees to call the [facility] counselors [phone]) to see if she qualifies for the Extra Help Program . Patient will be sure to rinse her mouth after every time she uses her inhaler.   Face to Face appointment with pharmacist scheduled for:  11/17/19 at 9:30 AM        AFIB   Patient with history of [employer] Pacemaker (04/18/18) History of brain hemorrhage with warfarin  Patient has failed these meds in past: n/a Patient is currently controlled on the following medications:   Carvedilol 12.5 mg BID   We discussed:  diet and exercise  extensively  Plan  Continue current medications   COPD / Asthma    Managed by Dr. Kara Mead and Wyn Quaker, NP  12/02/2017-pulmonary function test-FVC 1.33 (53% duty), postbronchodilator ratio 79, postbronchodilator FEV1 1.00 (51% predicted), DLCO 11.25 (43% predicted)  Gold Grade: Gold 2 (FEV1 50-79%) Current COPD Classification:  D (high sx, >/=2 exacerbations/yr)  Eosinophil count:   Lab Results  Component Value Date/Time   EOSPCT 1 10/22/2019 11:16 AM  %                               Eos (Absolute):  Lab Results  Component Value Date/Time   EOSABS 0.1 10/22/2019 11:16 AM    Tobacco Status:  Social History   Tobacco Use  Smoking Status Never Smoker  Smokeless Tobacco Never Used    Patient has failed these meds in past: Symbicort, Proventil HFA, QVAR, Flovent Patient is currently uncontrolled on the following medications:   Proventil 0.083% neb solution  Breo Ellipta 100-25 mcg/inh 1 puff daily   Benzonatate 100 mg TID PRN (using TID cough)   Using maintenance inhaler regularly? Yes Frequency of rescue  inhaler use:  9 times in past week.   We discussed:  Patient reports challenges affording her inhalers. She was previously on Symbicort which she liked, but had to switch because her inhaler was costing her ($400). Of note this was near the beginning of the year and may be a result of her needing to meet her deductible. Both Symbicort and Breo are T3 drugs on her insurance formulary.   Plan Recommend patient follow-up with pulmonology Given patient's recent exacerbation, and frequent use of nebulizer/PRN cough medicinesstep-up therapy may be warranted. Recommend initiating Trelegy, which is a similarly tiered medication on patient's formulary.    Heart Failure   Type: Diastolic  Last ejection fraction: 55-60% (10/25/19) NYHA Class: II (slight limitation of activity) AHA HF Stage: C (Heart disease and symptoms present)  Patient has failed these meds in past:   Patient is currently uncontrolled on the following medications:   Carvedilol 12.5 mg BID   Furosemide 80 mg BID   Weights over past week: 154 lbs (on discharge). Patient reports weight today was 154.4 lbs  We discussed: Patient reports she has been taking furosemide 160 mg twice daily for quite some time. Previous fills from Dr. Jeneen Rinks [place] were written for 160 mg BID, but patient discharged on most recent admission with furosemide 80 mg BID.  Plan Patient agreed to begin weighing her self daily and recording the results.  Continue current medications  and  Hypertension   BP today is:  <140/90  Office blood pressures are  BP Readings from Last 3 Encounters:  10/28/19 132/80  10/25/19 (!) 142/83  10/22/19 130/88    Patient has failed these meds in the past: n/a Patient is currently controlled on the following medications: n/a  Carvedilol 12.5 mg BID   Furosemide 80 mg BID (kidney)  Patient checks BP at home 1-2x per week  Patient home BP readings are ranging:  15-Apr 130/88  11-Apr 148/78  12-Mar 145/78  13-Mar 161/91   Plan  Continue current medications   Hyperlipidemia   History of hemorragic stroke   Lipid Panel  (05/12/15 in [place]): TC 132 Trig 111 HDL 51 LDL 55  The ASCVD Risk score (Goff DC Jr., et al., 2013) failed to calculate for the following reasons:   The patient has a prior MI or stroke diagnosis   Patient has failed these meds in past: n/a Patient is currently controlled on the following medications: none  We discussed:  diet and exercise extensively  Plan  Continue control with diet and exercise   Renal Transplant   Managed by Dr. Rosana Fret. 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 Otherwise patient reports tolerating well.   Plan  Continue current medications  Depression   Patient has failed these meds in past: Sertraline (ineffective)  Patient is currently uncontrolled on the following medications:   Duloxetine 30 mg daily   We discussed: Patient picked up new Rx of Duloxetine on 4/15, so she has yet to start the medication. Counseled patient on need to wait 4-8 weeks to see full effect and to avoid abrupt discontinuation.   Plan  Continue current medications  Misc/OTC  APAP 500 mg q6hr PRN  Aspirin 81 mg daily  Docusate 100 mg daily PRN  Doxycycline 100 mg BID x 7 days (completes course 11/01/19)  Hydrocodone-homatropine 5-1.5 mg/21mL syrup Magnesium 400 mg BID  Nystatin 100,000u/mL susp (patient recently resumed due to mouth sores)  Potassium chloride SA 20 mEq daily  Vitron/C 65 mg Fe, 120 mg Vitamin C daily   Plan  Continue current medications  Medication Management   Pt uses [facility] for all medications. [facility]. Has a chart of when she takes her medications, as well as a pillbox which she fills. She does report some difficulties with managing her medications and affording her medications (especially her inhalers). She reports her income is $12,267 but is unsure of her husband's income.   Plan  Patient referred to [facility] counselors to help patient apply for Extra Help [employer] program. If patient inelgibile, will plan to investigate patient assistance programs for her inhalers.    Follow up: 3 weeks  Doristine Section [phone]

## 2019-11-01 ENCOUNTER — Encounter: Payer: Self-pay | Admitting: Nurse Practitioner

## 2019-11-02 ENCOUNTER — Ambulatory Visit (INDEPENDENT_AMBULATORY_CARE_PROVIDER_SITE_OTHER): Payer: Medicare Other | Admitting: Psychology

## 2019-11-02 DIAGNOSIS — I0981 Rheumatic heart failure: Secondary | ICD-10-CM | POA: Diagnosis not present

## 2019-11-02 DIAGNOSIS — F4322 Adjustment disorder with anxiety: Secondary | ICD-10-CM | POA: Diagnosis not present

## 2019-11-02 DIAGNOSIS — N183 Chronic kidney disease, stage 3 unspecified: Secondary | ICD-10-CM | POA: Diagnosis not present

## 2019-11-02 DIAGNOSIS — D631 Anemia in chronic kidney disease: Secondary | ICD-10-CM | POA: Diagnosis not present

## 2019-11-02 DIAGNOSIS — I081 Rheumatic disorders of both mitral and tricuspid valves: Secondary | ICD-10-CM | POA: Diagnosis not present

## 2019-11-02 DIAGNOSIS — I5043 Acute on chronic combined systolic (congestive) and diastolic (congestive) heart failure: Secondary | ICD-10-CM | POA: Diagnosis not present

## 2019-11-02 DIAGNOSIS — I130 Hypertensive heart and chronic kidney disease with heart failure and stage 1 through stage 4 chronic kidney disease, or unspecified chronic kidney disease: Secondary | ICD-10-CM | POA: Diagnosis not present

## 2019-11-03 DIAGNOSIS — Z45018 Encounter for adjustment and management of other part of cardiac pacemaker: Secondary | ICD-10-CM | POA: Diagnosis not present

## 2019-11-03 DIAGNOSIS — I4891 Unspecified atrial fibrillation: Secondary | ICD-10-CM | POA: Diagnosis not present

## 2019-11-03 NOTE — Progress Notes (Signed)
@Patient  ID: [name], female    DOB: [birth year], 74 y.o.   MRN: [id number]  Chief Complaint  Patient presents with  . Follow-up    In hosp. 1 1/2 wks.ago-sob slightly better since out, tires easily,poor appetite    Referring provider: [name], Bonna Gains, NP  HPI:  74 year old female never smoker followed in our office for asthma  Past medical history: Morbid obesity, depression, obstructive sleep apnea, history of Pseudomonas infection, hypertension, end-stage renal disease, eczema Smoking history: Never smoker Maintenance: Breo 100 Patient of: Dr. Vassie Loll  11/04/2019  - Visit   74 year old female never smoker followed in our office for asthma, patient last completed follow-up with our office in June/2020.  She is followed by Dr. Vassie Loll.  At that time patient was encouraged to remain on Symbicort 80.  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  She was also requested to have follow-up with Dr. Vassie Loll in 4 to 6 months.  Patient presenting to office today as a hospital follow-up.  Patient was last scheduled as a virtual visit at the beginning of April/2021 which patient was unable to complete due to phone difficulties.  Patient was admitted to the hospital on 10/22/2019.  Discharged on 10/25/2019.  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), bronchitis with asthma exacerbation -Patient was placed on IV Lasix for diuresis with 60 mg every 12 hours  -I's and O's with 2 L negative balance  -2D echo showed EF of 55 to 60%,mild concentric LVH, right ventricular volume overload, improvement in EF from the previous echo (2D echo in 01/2018 had shown EF of 25 to 30% with diffuse hypokinesis) -Creatinine 2.2 at the time of discharge, close to her baseline -Resume oral Lasix 80 mg twice daily at the time of discharge  Active  Problems: Acute asthma exacerbation with bronchitis -Patient was placed on IV Solu-Medrol, Pulmicort, Brovana, scheduled nebs, IV Zithromax -Transition to oral prednisone with taper.  Patient instructed to complete the taper and then resume 5 mg of oral prednisone given her history of renal transplant -Continue Breo Ellipta, doxycycline   History of CKD stage III/renal transplant -Continue mycophenolate, Prograf, sodium bicarb -Resume prednisone 5 mg daily after higher dose prednisone taper is completed  Essential hypertension -Continue Lasix, Coreg,   Anemiaof chronic disease -H&H stable  Stroke (HCC), no residual deficits -Continue aspirin  Paroxysmalatrial fibrillation (HCC) -Has pacemaker, follows at [facility]  Depression Continue Zoloft   Pharmacy team with chronic care management sent message to our office on 10/30/2019.  Reporting that patient was using frequent albuterol despite Brio Ellipta.  It was suggested she may benefit from increasing her inhaler regimen.  Patient presenting to office today after seeing primary care.  She reports that she has not been seen yet by cardiology.  This is managed by [facility] cardiology.  She reports she is adherent to taking her Lasix.  Her weight is currently down around 6 pounds since discharge.  She reports that she is weighing herself daily.  Patient feels that the Earlie Server is working but she is unsure whether or not she is using the Ellipta device properly.  We will address and evaluate this today.  Walk today in office patient did not have any oxygen desaturations on room air.  Patient is currently preparing to work with physical therapy and occupational therapy at home status  post discharge.  Questionaires / Pulmonary Flowsheets:   MMRC: mMRC Dyspnea Scale mMRC Score  11/04/2019 3    Tests:   01/12/2019-CT chest high-res-assessment for ILD is limited by respiratory motion, no definitive  evidence of fibrotic interstitial lung disease, air trapping indicative of small airways disease, aortic arthrosclerosis, enlarged pulmonary arteries  02/04/2018-echocardiogram-LV ejection fraction 25 to 30%, grade 2 diastolic dysfunction, pulmonary artery systolic pressure 58  12/02/2017-pulmonary function test-FVC 1.33 (53% predicted), postbronchodilator ratio 79, postbronchodilator FEV1 1.00 (51% predicted), DLCO 11.25 (43% predicted)  10/23/2019-echocardiogram-LV ejection fraction 55 to 60%, D-shaped left ventricle consistent with right ventricular volume overload, right ventricle systolic function is low normal, moderately elevated pulmonary artery systolic pressure, estimated right ventricle systolic pressure is 60.7, left atrial size was severely dilated, right atrial size was severely dilated  10/22/2019-CBC with differential-eosinophils relative 1, eosinophils absolute 0.1  10/28/2019-chest x-ray-stable cardiomegaly with central pulmonary vascular congestion  FENO:  Lab Results  Component Value Date   NITRICOXIDE 15 10/02/2017    PFT: PFT Results Latest Ref Rng & Units 12/02/2017  FVC-Pre L 1.33  FVC-Predicted Pre % 53  FVC-Post L 1.27  FVC-Predicted Post % 50  Pre FEV1/FVC % % 77  Post FEV1/FCV % % 79  FEV1-Pre L 1.02  FEV1-Predicted Pre % 52  FEV1-Post L 1.00  DLCO UNC% % 43  DLCO COR %Predicted % 83  TLC L 2.89  TLC % Predicted % 55  RV % Predicted % 73    WALK:  SIX MIN WALK 09/04/2017  Supplimental Oxygen during Test? (L/min) No  Tech Comments: Patient only wanted to do 1 lap. O2 dropped to 90 slight SOB nothing too major    Imaging: DG Chest 1 View  Result Date: 10/23/2019 CLINICAL DATA:  Hypoxemia EXAM: CHEST  1 VIEW COMPARISON:  10/22/2019 FINDINGS: Cardiac shadow remains enlarged. Postsurgical changes are again seen. Occlusion device is noted overlying the left atrial appendage. Multiple coils are also noted within the left atrial appendage. Vascular congestive  changes are again seen without significant interstitial edema. No sizable effusion is noted. IMPRESSION: Persistent vascular congestion.  No acute edema is noted. Electronically Signed   By: Alcide Clever M.D.   On: 10/23/2019 15:53   DG Chest 2 View  Result Date: 10/28/2019 CLINICAL DATA:  Pulmonary congestion. EXAM: CHEST - 2 VIEW COMPARISON:  October 23, 2019. FINDINGS: Stable cardiomegaly. Central pulmonary vascular congestion is noted. Left-sided pacemaker is unchanged in position. No consolidative process is noted. No pneumothorax or pleural effusion is noted. Bony thorax is unremarkable. IMPRESSION: Stable cardiomegaly with central pulmonary vascular congestion. Electronically Signed   By: Lupita Raider M.D.   On: 10/28/2019 12:06   DG Chest Port 1 View  Result Date: 10/22/2019 CLINICAL DATA:  Shortness of breath, cough EXAM: PORTABLE CHEST 1 VIEW COMPARISON:  11/14/2018 FINDINGS: Cardiomegaly with left chest single lead pacer defibrillator and Watchman left atrial appendage occlusion device. Mild, diffuse interstitial pulmonary opacity. The visualized skeletal structures are unremarkable. IMPRESSION: Cardiomegaly with mild, diffuse interstitial pulmonary opacity, likely edema. Electronically Signed   By: Lauralyn Primes M.D.   On: 10/22/2019 11:50   ECHOCARDIOGRAM COMPLETE  Result Date: 10/23/2019    ECHOCARDIOGRAM REPORT   Patient Name:   [name] Date of Exam: 10/23/2019 Medical Rec #:  [id number]     Height:       65.0 in Accession #:    [id number]    Weight:       152.1 lb Date of  Birth:  05/11/1946    BSA:          1.761 m Patient Age:    73 years      BP:           140/80 mmHg Patient Gender: F             HR:           70 bpm. Exam Location:  Inpatient Procedure: 2D Echo, Cardiac Doppler and Color Doppler Indications:    R94.31 Abnormal EKG  History:        Patient has prior history of Echocardiogram examinations, most                 recent 02/04/2018. CHF and Cardiomyopathy, Abnormal ECG,  COPD and                 Stroke, Arrythmias:Atrial Fibrillation; Risk Factors:Sleep                 Apnea. Pulmonary embolus.  Sonographer:    Sheralyn Boatman RDCS Referring Phys: [id number] Gardiner Ramus MATHEWS  Sonographer Comments: Image acquisition challenging due to respiratory motion. IMPRESSIONS  1. Left ventricular ejection fraction, by estimation, is 55 to 60%. The left ventricle has normal function. The left ventricle has no regional wall motion abnormalities. There is mild concentric left ventricular hypertrophy. Left ventricular diastolic function could not be evaluated. There is the interventricular septum is flattened in diastole ('D' shaped left ventricle), consistent with right ventricular volume overload.  2. Right ventricular systolic function is low normal. The right ventricular size is moderately enlarged. There is moderately elevated pulmonary artery systolic pressure. The estimated right ventricular systolic pressure is 60.7 mmHg.  3. Left atrial size was severely dilated.  4. Right atrial size was severely dilated.  5. The mitral valve is normal in structure. Mild to moderate mitral valve regurgitation. No evidence of mitral stenosis.  6. 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  7. The aortic valve is normal in structure. Aortic valve regurgitation is mild. No aortic stenosis is present.  8. The inferior vena cava is dilated in size with <50% respiratory variability, suggesting right atrial pressure of 15 mmHg. Comparison(s): Prior images reviewed side by side. The left ventricular function has improved. The right ventricular systolic function is unchanged. FINDINGS  Left Ventricle: Left ventricular ejection fraction, by estimation, is 55 to 60%. The left ventricle has normal function. The left ventricle has no regional wall motion abnormalities. The left ventricular internal cavity size was normal in size. There is  mild concentric left ventricular hypertrophy. The  interventricular septum is flattened in diastole ('D' shaped left ventricle), consistent with right ventricular volume overload. Left ventricular diastolic function could not be evaluated due to atrial fibrillation. Left ventricular diastolic function could not be evaluated. Right Ventricle: The right ventricular size is moderately enlarged. No increase in right ventricular wall thickness. Right ventricular systolic function is low normal. There is moderately elevated pulmonary artery systolic pressure. The tricuspid regurgitant velocity is 3.38 m/s, and with an assumed right atrial pressure of 15 mmHg, the estimated right ventricular systolic pressure is 60.7 mmHg. Left Atrium: Left atrial size was severely dilated. Right Atrium: Right atrial size was severely dilated. Pericardium: There is no evidence of pericardial effusion. Mitral Valve: The mitral valve is normal in structure. Mild to moderate mitral valve regurgitation, with centrally-directed jet. No evidence of mitral valve stenosis. Tricuspid Valve: The tricuspid valve is myxomatous. Tricuspid valve regurgitation  is severe. The flow in the hepatic veins is reversed during ventricular systole. Aortic Valve: The aortic valve is normal in structure. Aortic valve regurgitation is mild. No aortic stenosis is present. Pulmonic Valve: The pulmonic valve was normal in structure. Pulmonic valve regurgitation is not visualized. Aorta: The aortic root and ascending aorta are structurally normal, with no evidence of dilitation. Venous: The inferior vena cava is dilated in size with less than 50% respiratory variability, suggesting right atrial pressure of 15 mmHg. IAS/Shunts: No atrial level shunt detected by color flow Doppler.  LEFT VENTRICLE PLAX 2D LVIDd:         5.40 cm     Diastology LVIDs:         3.72 cm     LV e' lateral:   10.30 cm/s LV PW:         1.42 cm     LV E/e' lateral: 10.0 LV IVS:        1.45 cm     LV e' medial:    7.62 cm/s LVOT diam:     1.80 cm      LV E/e' medial:  13.6 LV SV:         52 LV SV Index:   29 LVOT Area:     2.54 cm  LV Volumes (MOD) LV vol d, MOD A2C: 99.0 ml LV vol d, MOD A4C: 59.7 ml LV vol s, MOD A2C: 50.8 ml LV vol s, MOD A4C: 49.4 ml LV SV MOD A2C:     48.2 ml LV SV MOD A4C:     59.7 ml LV SV MOD BP:      32.2 ml RIGHT VENTRICLE             IVC RV S prime:     11.50 cm/s  IVC diam: 3.29 cm TAPSE (M-mode): 2.1 cm LEFT ATRIUM              Index       RIGHT ATRIUM           Index LA diam:        4.20 cm  2.39 cm/m  RA Area:     16.30 cm LA Vol (A2C):   91.9 ml  52.19 ml/m RA Volume:   38.60 ml  21.92 ml/m LA Vol (A4C):   99.6 ml  56.56 ml/m LA Biplane Vol: 104.0 ml 59.06 ml/m  AORTIC VALVE LVOT Vmax:   107.00 cm/s LVOT Vmean:  75.700 cm/s LVOT VTI:    0.204 m  AORTA Ao Root diam: 3.60 cm MITRAL VALVE                TRICUSPID VALVE MV Area (PHT): 4.25 cm     TR Peak grad:   45.7 mmHg MV Decel Time: 178 msec     TR Vmax:        338.00 cm/s MV E velocity: 103.50 cm/s                             SHUNTS                             Systemic VTI:  0.20 m                             Systemic Diam: 1.80 cm Thurmon Fair MD Electronically signed  by Thurmon Fair MD Signature Date/Time: 10/23/2019/4:27:04 PM    Final     Lab Results:  CBC    Component Value Date/Time   WBC 12.7 (H) 10/22/2019 1116   RBC 4.26 10/22/2019 1116   HGB 10.9 (L) 10/22/2019 1116   HCT 35.5 (L) 10/22/2019 1116   PLT 218 10/22/2019 1116   MCV 83.3 10/22/2019 1116   MCH 25.6 (L) 10/22/2019 1116   MCHC 30.7 10/22/2019 1116   RDW 17.7 (H) 10/22/2019 1116   LYMPHSABS 0.9 10/22/2019 1116   MONOABS 1.3 (H) 10/22/2019 1116   EOSABS 0.1 10/22/2019 1116   BASOSABS 0.1 10/22/2019 1116    BMET    Component Value Date/Time   NA 141 10/25/2019 0542   NA 139 01/28/2019 0000   K 3.6 10/25/2019 0542   CL 100 10/25/2019 0542   CO2 31 10/25/2019 0542   GLUCOSE 122 (H) 10/25/2019 0542   BUN 66 (H) 10/25/2019 0542   BUN 27 (A) 01/28/2019 0000   CREATININE  2.20 (H) 10/25/2019 0542   CALCIUM 9.3 10/25/2019 0542   CALCIUM 11.1 (H) 09/11/2011 0825   GFRNONAA 22 (L) 10/25/2019 0542   GFRAA 25 (L) 10/25/2019 0542    BNP    Component Value Date/Time   BNP 970.7 (H) 10/22/2019 1100    ProBNP No results found for: PROBNP  Specialty Problems      Pulmonary Problems   Asthma    Qualifier: Diagnosis of  By: Cheri Guppy        SINUSITIS- ACUTE-NOS    Qualifier: Diagnosis of  By: Alwyn Ren MD, William        Obstructive sleep apnea    Last Assessment & Plan:  Cont current mgt      Exacerbation of asthma   Dyspnea   Chronic respiratory failure with hypoxia (HCC)   COPD exacerbation (HCC)      Allergies  Allergen Reactions  . Ace Inhibitors Other (See Comments)    Reaction unknown  . 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 Warfarin Other (See Comments)    Left side brain hemorrhage  . Warfarin Sodium Other (See Comments)    Caused her to have a stroke    Immunization History  Administered Date(s) Administered  . Fluad Quad(high Dose 65+) 04/28/2019  . Influenza Split 03/27/2012  . Influenza Whole 04/29/2008  . Influenza, High Dose Seasonal PF 04/19/2017, 03/16/2018  . Influenza,inj,Quad PF,6+ Mos 04/24/2013  . Influenza-Unspecified 04/29/2014, 03/17/2015, 04/18/2016  . PFIZER SARS-COV-2 Vaccination 09/09/2019, 09/23/2019  . Pneumococcal Conjugate-13 03/02/2014  . Pneumococcal Polysaccharide-23 04/25/2005, 09/18/2016  . Tdap 05/08/2013    Past Medical History:  Diagnosis Date  . Acute diastolic (congestive) heart failure (HCC) 08/12/2017  . AKI (acute kidney injury) (HCC) 11/04/2017  . Anemia   . Anxiety   . Arthritis    "shoulders; arms; hips" (02/04/2018)  . Asthma   . Atrial fibrillation (HCC)   . Atrial flutter [facility])    s/p aflutter ablation at [facility]  . Benign hypertension with ESRD (end-stage renal disease) (HCC)   . Blood transfusion     never had a reaction to blood transfusion  . Cardiomyopathy Mar 2013   Mild, EF 50-55% by Mar 2013 ECHO, diast dysfxn II  . Cardiomyopathy (HCC)   . CHF (congestive heart failure) (HCC) 2005  . CHF (congestive heart failure) (HCC)   . Childhood asthma   . Closed fracture of  right distal radius 10/18/2016  . CMV (cytomegalovirus infection) (HCC) 10/03/2015  . Constipation    takes Miralax daily  . Constipation   . Depression    takes Zoloft daily  . Distal radius fracture, right   . DVT of lower extremity, bilateral (HCC) 12/21/11   "they're there now; been there for 2 wks"  . Eczema   . ESRD (end stage renal disease) on dialysis [facility]) 09/2011   07-19-2015 had Kidney transplant at [facility]; "don't get dialysis anymore" (02/04/2018)  . Fractures, stress    in both feet--6 OR 7 YRS AGO--RESOLVED  . Generalized edema [id number]  . Gout    doesn't require meds   . HCAP (healthcare-associated pneumonia) 09/10/2011  . Hearing difficulty   . High cholesterol   . History of CVA (cerebrovascular accident) without residual deficits   . History of hip replacement, total, right   . History of pneumonia   . Hx of Clostridium difficile infection   . Hypercalcemia    09/10/11  . Hyperparathyroidism (HCC) 04/07/2013  . Hypertension    takes Diltiazem daily   . Hypomagnesemia 08/02/2015  . Hypophosphatemia 11/08/2017  . ICB (intracranial bleed) (HCC)   . 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 Memory changes   . Morbid obesity (HCC)   . Nonischemic dilated cardiomyopathy (HCC)   . Obesity 04/07/2013  . Oligouria   . OSA on CPAP   . PAF (paroxysmal atrial fibrillation) (HCC)     HX OF CEREBRAL BLEED WHILE ON COUMADIN-SO PT NOT ON ANY BLOOD THINNERS NOW  . Peripheral vascular disease (HCC)   . Presence of Watchman left atrial appendage closure device 10/04/2017  . Proteinuria 09/04/2016  . Psoriasis 08/07/2016  . 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 S/P insertion of IVC (inferior vena  caval) filter   . Sepsis (HCC) 11/08/2017  . Stress fracture    bilateral feet  . Stroke [facility]) 2009   denies residual on 02/04/2018;  hemorrhagic now off coumadin  . Stroke due to intracerebral hemorrhage (HCC) 2009  . Suture reaction 09/04/2016  . Transfusion history   . Use of cane as ambulatory aid     Tobacco History: Social History   Tobacco Use  Smoking Status Never Smoker  Smokeless Tobacco Never Used   Counseling given: Not Answered   Continue to not smoke  Outpatient Encounter Medications as of 11/04/2019  Medication Sig  . acetaminophen (TYLENOL) 500 MG tablet Take 500 mg by mouth every 6 (six) hours as needed.  Marland Kitchen albuterol (PROVENTIL) (2.5 MG/3ML) 0.083% nebulizer solution Take 3 mLs (2.5 mg total) by nebulization every 6 (six) hours as needed for wheezing or shortness of breath. Dx:J45.909  . aspirin EC 81 MG tablet Take 81 mg by mouth daily.   . benzonatate (TESSALON) 100 MG capsule Take 1 capsule (100 mg total) by mouth 3 (three) times daily as needed for cough.  . carvedilol (COREG) 25 MG tablet Take 25 mg by mouth 2 (two) times daily with a meal.  . docusate sodium (COLACE) 100 MG capsule Take 100 mg by mouth daily as needed (For constipation.).   Marland Kitchen DULoxetine (CYMBALTA) 30 MG capsule Take 1 capsule (30 mg total) by mouth daily.  . Ferrous Sulfate (IRON PO) Take 325 mg by mouth daily.   . fluticasone furoate-vilanterol (BREO ELLIPTA) 100-25 MCG/INH AEPB Inhale 1 puff into the lungs daily.  . furosemide (LASIX) 80 MG tablet Take 1 tablet (80 mg total) by mouth 2 (two) times daily.  . Iron-Vitamin  C (VITRON-C) 65-125 MG TABS Take 1 tablet by mouth daily.  . Magnesium 400 MG TABS Take by mouth 2 (two) times daily.  . magnesium oxide (MAG-OX) 400 MG tablet Take 2 tablets by mouth 2 (two) times daily.  . mycophenolate (MYFORTIC) 180 MG EC tablet Take 180 mg by mouth 2 (two) times daily.  Marland Kitchen nystatin (MYCOSTATIN) 100000 UNIT/ML suspension Take 5 mLs (500,000 Units  total) by mouth 4 (four) times daily.  . potassium chloride SA (K-DUR,KLOR-CON) 20 MEQ tablet Take 20 mEq by mouth daily.   . predniSONE (DELTASONE) 5 MG tablet Take 1 tablet (5 mg total) by mouth daily with breakfast. Please resume after you have completed prednisone taper  . tacrolimus (PROGRAF) 1 MG capsule Take 2 mg by mouth 2 (two) times daily.   . carvedilol (COREG) 12.5 MG tablet Take 1 tablet (12.5 mg total) by mouth 2 (two) times daily. (Patient not taking: Reported on 11/04/2019)  . HYDROcodone-homatropine (HYCODAN) 5-1.5 MG/5ML syrup Take 5 mLs by mouth every 8 (eight) hours as needed for cough. (Patient not taking: Reported on 11/04/2019)  . SODIUM BICARBONATE PO Take by mouth. 10 grain--taking 2 tab PO BID   No facility-administered encounter medications on file as of 11/04/2019.     Review of Systems  Review of Systems  Constitutional: Positive for fatigue. Negative for activity change and fever.  HENT: Negative for sinus pressure, sinus pain and sore throat.   Respiratory: Positive for shortness of breath. Negative for cough and wheezing.   Cardiovascular: Negative for chest pain and palpitations.  Gastrointestinal: Negative for diarrhea, nausea and vomiting.  Musculoskeletal: Negative for arthralgias.  Neurological: Negative for dizziness.  Psychiatric/Behavioral: Negative for sleep disturbance. The patient is not nervous/anxious.      Physical Exam  BP 118/64 (BP Location: Right Arm, Cuff Size: Normal)   Pulse 73   Temp 97.8 F (36.6 C) (Temporal)   Ht 5\' 5"  (1.651 m)   Wt 148 lb 12.8 oz (67.5 kg)   SpO2 98%   BMI 24.76 kg/m   Wt Readings from Last 5 Encounters:  11/04/19 148 lb 12.8 oz (67.5 kg)  10/28/19 154 lb 3.2 oz (69.9 kg)  10/25/19 156 lb 4.9 oz (70.9 kg)  10/22/19 150 lb 6.4 oz (68.2 kg)  05/29/19 154 lb 9.6 oz (70.1 kg)    BMI Readings from Last 5 Encounters:  11/04/19 24.76 kg/m  10/28/19 25.66 kg/m  10/25/19 26.01 kg/m  10/22/19 25.03  kg/m  05/29/19 25.73 kg/m     Physical Exam Vitals and nursing note reviewed.  Constitutional:      General: She is not in acute distress.    Appearance: Normal appearance. She is normal weight.  HENT:     Head: Normocephalic and atraumatic.     Right Ear: Tympanic membrane, ear canal and external ear normal. There is no impacted cerumen.     Left Ear: Tympanic membrane, ear canal and external ear normal. There is no impacted cerumen.     Nose: Nose normal. No congestion.     Mouth/Throat:     Mouth: Mucous membranes are moist.     Pharynx: Oropharynx is clear.  Eyes:     Pupils: Pupils are equal, round, and reactive to light.  Cardiovascular:     Rate and Rhythm: Normal rate and regular rhythm.     Pulses: Normal pulses.     Heart sounds: Normal heart sounds. No murmur.  Pulmonary:     Breath sounds: Normal breath  sounds. No decreased air movement. No decreased breath sounds, wheezing or rales.  Musculoskeletal:     Cervical back: Normal range of motion.     Right lower leg: No edema.     Left lower leg: No edema.  Skin:    General: Skin is warm and dry.     Capillary Refill: Capillary refill takes less than 2 seconds.  Neurological:     General: No focal deficit present.     Mental Status: She is alert and oriented to person, place, and time. Mental status is at baseline.     Motor: Weakness present.     Gait: Gait abnormal (Walks with a cane).  Psychiatric:        Mood and Affect: Mood normal.        Behavior: Behavior normal.        Thought Content: Thought content normal.        Judgment: Judgment normal.       Assessment & Plan:   Renal transplant recipient Plan: Continue medications as managed by nephrology Continue follow-up with nephrology  Medication management Patient is unsure if she is using the Ellipta device properly May benefit from inhaler change No audible wheezing on exam today  Plan: We will review inhaler device teaching today We  will also bring patient back for pharmacy team visit to further review inhaler device teaching as well as review inspiratory flows  Asthma No audible wheezing on exam today  Plan: Continue Breo Ellipta 100 Reviewed Ellipta device use We will bring patient back for clinical pharmacy team visit Walk today in office although patient walk short distance no oxygen desaturations Continue Saba nebulized meds Continue Saba rescue inhaler  Acute on chronic combined systolic and diastolic congestive heart failure (HCC) Believe this is the patient's largest component of her shortness of breath and fatigue.  This is slowly improving since being discharged.  Weight is also continue to improve. Patient has no scheduled follow-up with her cardiologist  plan: She will contact them today to notify that she was discharged from the hospital with an acute heart failure exacerbation  Patient to schedule appointment with [facility] cardiology Patient also to be referred to the [facility] health heart failure clinic Continue to weigh yourself daily Continue take diuretics      Return in about 4 weeks (around 12/02/2019), or if symptoms worsen or fail to improve, for Follow up with Dr. Vassie Loll.   Coral Ceo, NP 11/04/2019   This appointment required 45 minutes of patient care (this includes precharting, chart review, review of results, face-to-face care, etc.).

## 2019-11-04 ENCOUNTER — Other Ambulatory Visit: Payer: Self-pay

## 2019-11-04 ENCOUNTER — Encounter: Payer: Self-pay | Admitting: Pulmonary Disease

## 2019-11-04 ENCOUNTER — Ambulatory Visit (INDEPENDENT_AMBULATORY_CARE_PROVIDER_SITE_OTHER): Payer: Medicare Other | Admitting: Pulmonary Disease

## 2019-11-04 VITALS — BP 118/64 | HR 73 | Temp 97.8°F | Ht 65.0 in | Wt 148.8 lb

## 2019-11-04 DIAGNOSIS — D631 Anemia in chronic kidney disease: Secondary | ICD-10-CM | POA: Diagnosis not present

## 2019-11-04 DIAGNOSIS — J4541 Moderate persistent asthma with (acute) exacerbation: Secondary | ICD-10-CM

## 2019-11-04 DIAGNOSIS — Z79899 Other long term (current) drug therapy: Secondary | ICD-10-CM | POA: Insufficient documentation

## 2019-11-04 DIAGNOSIS — I130 Hypertensive heart and chronic kidney disease with heart failure and stage 1 through stage 4 chronic kidney disease, or unspecified chronic kidney disease: Secondary | ICD-10-CM | POA: Diagnosis not present

## 2019-11-04 DIAGNOSIS — I5043 Acute on chronic combined systolic (congestive) and diastolic (congestive) heart failure: Secondary | ICD-10-CM

## 2019-11-04 DIAGNOSIS — I081 Rheumatic disorders of both mitral and tricuspid valves: Secondary | ICD-10-CM | POA: Diagnosis not present

## 2019-11-04 DIAGNOSIS — Z5181 Encounter for therapeutic drug level monitoring: Secondary | ICD-10-CM | POA: Insufficient documentation

## 2019-11-04 DIAGNOSIS — Z940 Kidney transplant status: Secondary | ICD-10-CM

## 2019-11-04 DIAGNOSIS — I0981 Rheumatic heart failure: Secondary | ICD-10-CM | POA: Diagnosis not present

## 2019-11-04 DIAGNOSIS — N183 Chronic kidney disease, stage 3 unspecified: Secondary | ICD-10-CM | POA: Diagnosis not present

## 2019-11-04 NOTE — Assessment & Plan Note (Signed)
Plan: Continue medications as managed by nephrology Continue follow-up with nephrology

## 2019-11-04 NOTE — Patient Instructions (Addendum)
You were seen today by Lauraine Rinne, NP  for:   1. Acute on chronic combined systolic and diastolic congestive heart failure (HCC)  - AMB referral to CHF clinic  Schedule follow-up with [facility] cardiology  I have referred you to the heart failure clinic here at [facility]  Continue to take your fluid pills  Continue to follow-up with nephrology  Continue to weigh yourself daily  2. Moderate persistent asthma   Breo Ellipta 100 >>> Take 1 puff daily in the morning right when you wake up >>>Rinse your mouth out after use >>>This is a daily maintenance inhaler, NOT a rescue inhaler >>>Contact our office if you are having difficulties affording or obtaining this medication >>>It is important for you to be able to take this daily and not miss any doses   3. ESRD (end stage renal disease)   Continue follow-up with nephrology  4. Medication management  Please present to our office in 1 weeks for an appointment with the clinical pharmacy team for:  Marland Kitchen Medication Management  . Medication reconciliation  . Medication Access  . Inhaler teaching - ellipta device, assess inhaler options     We recommend today:  Orders Placed This Encounter  Procedures  . AMB referral to CHF clinic    Referral Priority:   Routine    Referral Type:   Consultation    Number of Visits Requested:   1   Orders Placed This Encounter  Procedures  . AMB referral to CHF clinic   No orders of the defined types were placed in this encounter.   Follow Up:    Return in about 4 weeks (around 12/02/2019), or if symptoms worsen or fail to improve, for Follow up with Dr. Elsworth Soho.  Please present to our office in 1 weeks for an appointment with the clinical pharmacy team for:  Marland Kitchen Medication Management  . Medication reconciliation  . Medication Access  . Inhaler teaching - ellipta device, assess inhaler options   Please do your part to reduce the spread of COVID-19:      Reduce your risk of any infection   and COVID19 by using the similar precautions used for avoiding the common cold or flu:  Marland Kitchen Wash your hands often with soap and warm water for at least 20 seconds.  If soap and water are not readily available, use an alcohol-based hand sanitizer with at least 60% alcohol.  . If coughing or sneezing, cover your mouth and nose by coughing or sneezing into the elbow areas of your shirt or coat, into a tissue or into your sleeve (not your hands). Langley Gauss A MASK when in public  . Avoid shaking hands with others and consider head nods or verbal greetings only. . Avoid touching your eyes, nose, or mouth with unwashed hands.  . Avoid close contact with people who are sick. . Avoid places or events with large numbers of people in one location, like concerts or sporting events. . If you have some symptoms but not all symptoms, continue to monitor at home and seek medical attention if your symptoms worsen. . If you are having a medical emergency, call 911.   [place] / e-Visit: [email or web address]         [facility]: [facility]: [phone]                   [facility]: [phone]  It is flu season:   >>> Best ways to protect herself from the flu: Receive the yearly flu vaccine, practice good hand hygiene washing with soap and also using hand sanitizer when available, eat a nutritious meals, get adequate rest, hydrate appropriately   Please contact the office if your symptoms worsen or you have concerns that you are not improving.   Thank you for choosing [facility] for your healthcare, and for allowing [name] to partner with you on your healthcare journey. I am thankful to be able to provide care to you today.   Wyn Quaker FNP-C

## 2019-11-04 NOTE — Assessment & Plan Note (Signed)
Believe this is the patient's largest component of her shortness of breath and fatigue.  This is slowly improving since being discharged.  Weight is also continue to improve. Patient has no scheduled follow-up with her cardiologist  plan: She will contact them today to notify that she was discharged from the hospital with an acute heart failure exacerbation  Patient to schedule appointment with [facility] cardiology Patient also to be referred to the [facility] health heart failure clinic Continue to weigh yourself daily Continue take diuretics

## 2019-11-04 NOTE — Assessment & Plan Note (Signed)
No audible wheezing on exam today  Plan: Continue Breo Ellipta 100 Reviewed Ellipta device use We will bring patient back for clinical pharmacy team visit Walk today in office although patient walk short distance no oxygen desaturations Continue Saba nebulized meds Continue Saba rescue inhaler

## 2019-11-04 NOTE — Assessment & Plan Note (Signed)
Patient is unsure if she is using the Ellipta device properly May benefit from inhaler change No audible wheezing on exam today  Plan: We will review inhaler device teaching today We will also bring patient back for pharmacy team visit to further review inhaler device teaching as well as review inspiratory flows

## 2019-11-05 ENCOUNTER — Other Ambulatory Visit: Payer: Self-pay | Admitting: *Deleted

## 2019-11-05 ENCOUNTER — Encounter: Payer: Self-pay | Admitting: *Deleted

## 2019-11-05 DIAGNOSIS — D631 Anemia in chronic kidney disease: Secondary | ICD-10-CM | POA: Diagnosis not present

## 2019-11-05 DIAGNOSIS — I0981 Rheumatic heart failure: Secondary | ICD-10-CM | POA: Diagnosis not present

## 2019-11-05 DIAGNOSIS — N183 Chronic kidney disease, stage 3 unspecified: Secondary | ICD-10-CM | POA: Diagnosis not present

## 2019-11-05 DIAGNOSIS — I130 Hypertensive heart and chronic kidney disease with heart failure and stage 1 through stage 4 chronic kidney disease, or unspecified chronic kidney disease: Secondary | ICD-10-CM | POA: Diagnosis not present

## 2019-11-05 DIAGNOSIS — I5043 Acute on chronic combined systolic (congestive) and diastolic (congestive) heart failure: Secondary | ICD-10-CM | POA: Diagnosis not present

## 2019-11-05 DIAGNOSIS — I081 Rheumatic disorders of both mitral and tricuspid valves: Secondary | ICD-10-CM | POA: Diagnosis not present

## 2019-11-05 NOTE — Patient Outreach (Signed)
[facility]) Care Management  11/05/2019  [name] 06/10/1946 [id number]    Subjective: Telephone call to patient's home number, spoke with patient, and HIPAA verified.  Discussed [facility] Care Management Medicare EMMI General Discharge follow up, patient voiced understanding, and is in agreement to follow up.   Patient states she remembers receiving EMMI automated calls.   Patient states she is doing okay, does not feel like doing the things she use to due to breathing and fatigue issues. Patient states she had a follow up visit with primary provider on 10/28/2019 and appointment went well.  Patient states she is able to manage self care and has assistance as needed. Patient states she is aware of signs/ symptoms to report, how to reach provider if needed after hours, when to go to ED, and / or call 911.  Patient voices understanding of medical diagnosis and treatment plan. States she is accessing her Medicare benefits as needed via member services number on back of card.  Medication review completed, patient states she takes Lasix 80mg  2 tabs bid, has been taking this regimen for a long time, she verified her tablets are 80 mg tablets, not 40 mg tablets, advised per medication assessment, and most recent discharge instructions, Lasix is 80mg  bid.  Patient states she will call her kidney MD today to verify Lasix regimen. Discussed Advanced Directives, advised of [place] Management Social Worker  Advanced Directives document completion benefit, patient voices understanding, and in agreement to a referral to Education officer, museum will to ask Social Worker to send [employer], and follow up on document completion.  States she would like assistance with updating her current documents. Depression screening assessment completed, results discussed with patient, patient voices understanding, patient in agreement to a referral to [place] Management Social Worker for depression screening  follow up, has given permission for Education officer, museum to discuss screening results with patient's provider as needed, and Education officer, museum will provide counseling / behavioral health community resources per patient's request.  Patient states she does not have any Air traffic controller, EMMI follow up,  transportation, [employer], or pharmacy needs at this time. States she is very appreciative of the follow up, is in agreement  to receive 1 additional follow up call to assess for further CM needs, and is in agreement to receive [place] Management EMMI follow up calls as needed.     Objective: Per KPN (Knowledge Performance Now, point of care tool) and chart review, patient hospitalized  10/22/2019 - 10/25/2019 for Acute respiratory failure with hypoxia, Acute on chronic combined systolic and diastolic congestive heart failure.  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       Assessment: Received Medicare EMMI General Discharge Red Flag Alert follow up referral on 11/02/2019. Red Flag Alert Triggers, Day #4, times 2, patient answered yes to the following questions: Lost interest in things?  Sad/hopeless/anxious/empty?   EMMI follow up completed and will follow up to assess further care management needs.       Plan: RNCM will call patient for telephone outreach attempt, within 21 business days, EMMI follow up, to assess for further CM needs, and proceed with case closure, within 10 business days if no return call, after 3rd unsuccessful outreach call.      Sherrin Stahle H. Burt Knack Therapist, sports, BSN, [place] Management [facility] Telephonic CM Phone:  [phone] Fax: [phone]

## 2019-11-06 ENCOUNTER — Other Ambulatory Visit: Payer: Self-pay | Admitting: *Deleted

## 2019-11-09 DIAGNOSIS — I5043 Acute on chronic combined systolic (congestive) and diastolic (congestive) heart failure: Secondary | ICD-10-CM | POA: Diagnosis not present

## 2019-11-09 DIAGNOSIS — I081 Rheumatic disorders of both mitral and tricuspid valves: Secondary | ICD-10-CM | POA: Diagnosis not present

## 2019-11-09 DIAGNOSIS — I130 Hypertensive heart and chronic kidney disease with heart failure and stage 1 through stage 4 chronic kidney disease, or unspecified chronic kidney disease: Secondary | ICD-10-CM | POA: Diagnosis not present

## 2019-11-09 DIAGNOSIS — D631 Anemia in chronic kidney disease: Secondary | ICD-10-CM | POA: Diagnosis not present

## 2019-11-09 DIAGNOSIS — N183 Chronic kidney disease, stage 3 unspecified: Secondary | ICD-10-CM | POA: Diagnosis not present

## 2019-11-09 DIAGNOSIS — I0981 Rheumatic heart failure: Secondary | ICD-10-CM | POA: Diagnosis not present

## 2019-11-11 ENCOUNTER — Other Ambulatory Visit: Payer: Self-pay | Admitting: *Deleted

## 2019-11-11 ENCOUNTER — Encounter: Payer: Self-pay | Admitting: *Deleted

## 2019-11-11 DIAGNOSIS — N183 Chronic kidney disease, stage 3 unspecified: Secondary | ICD-10-CM | POA: Diagnosis not present

## 2019-11-11 DIAGNOSIS — I0981 Rheumatic heart failure: Secondary | ICD-10-CM | POA: Diagnosis not present

## 2019-11-11 DIAGNOSIS — I081 Rheumatic disorders of both mitral and tricuspid valves: Secondary | ICD-10-CM | POA: Diagnosis not present

## 2019-11-11 DIAGNOSIS — I130 Hypertensive heart and chronic kidney disease with heart failure and stage 1 through stage 4 chronic kidney disease, or unspecified chronic kidney disease: Secondary | ICD-10-CM | POA: Diagnosis not present

## 2019-11-11 DIAGNOSIS — I5043 Acute on chronic combined systolic (congestive) and diastolic (congestive) heart failure: Secondary | ICD-10-CM | POA: Diagnosis not present

## 2019-11-11 DIAGNOSIS — D631 Anemia in chronic kidney disease: Secondary | ICD-10-CM | POA: Diagnosis not present

## 2019-11-11 NOTE — Patient Outreach (Signed)
[place] [facility]) Care Management  11/11/2019  [name] [birth year] [id number]   CSW made initial outreach call to pt today and was unsuccessful.  CSW left a HIPPA compliant voice message and will await callback. CSW will outreach again in 3-4 business days if no return call is received. CSW will send pt an unsuccessful outreach letter and   Eduard Clos, MSW, [place] Worker  [place] [phone]

## 2019-11-14 ENCOUNTER — Other Ambulatory Visit: Payer: Self-pay

## 2019-11-14 ENCOUNTER — Inpatient Hospital Stay (HOSPITAL_BASED_OUTPATIENT_CLINIC_OR_DEPARTMENT_OTHER)
Admission: EM | Admit: 2019-11-14 | Discharge: 2019-11-18 | DRG: 378 | Disposition: A | Discharge status: Home Health | Payer: Medicare Other | Attending: Internal Medicine | Admitting: Internal Medicine

## 2019-11-14 ENCOUNTER — Encounter (HOSPITAL_BASED_OUTPATIENT_CLINIC_OR_DEPARTMENT_OTHER): Payer: Self-pay | Admitting: Emergency Medicine

## 2019-11-14 DIAGNOSIS — Z8673 Personal history of transient ischemic attack (TIA), and cerebral infarction without residual deficits: Secondary | ICD-10-CM | POA: Diagnosis not present

## 2019-11-14 DIAGNOSIS — I130 Hypertensive heart and chronic kidney disease with heart failure and stage 1 through stage 4 chronic kidney disease, or unspecified chronic kidney disease: Secondary | ICD-10-CM | POA: Diagnosis not present

## 2019-11-14 DIAGNOSIS — I739 Peripheral vascular disease, unspecified: Secondary | ICD-10-CM | POA: Diagnosis present

## 2019-11-14 DIAGNOSIS — D62 Acute posthemorrhagic anemia: Secondary | ICD-10-CM | POA: Diagnosis present

## 2019-11-14 DIAGNOSIS — D509 Iron deficiency anemia, unspecified: Secondary | ICD-10-CM | POA: Diagnosis not present

## 2019-11-14 DIAGNOSIS — G4733 Obstructive sleep apnea (adult) (pediatric): Secondary | ICD-10-CM | POA: Diagnosis present

## 2019-11-14 DIAGNOSIS — Z940 Kidney transplant status: Secondary | ICD-10-CM

## 2019-11-14 DIAGNOSIS — Z96641 Presence of right artificial hip joint: Secondary | ICD-10-CM | POA: Diagnosis present

## 2019-11-14 DIAGNOSIS — I420 Dilated cardiomyopathy: Secondary | ICD-10-CM | POA: Diagnosis present

## 2019-11-14 DIAGNOSIS — Z7951 Long term (current) use of inhaled steroids: Secondary | ICD-10-CM

## 2019-11-14 DIAGNOSIS — J1282 Pneumonia due to coronavirus disease 2019: Secondary | ICD-10-CM | POA: Diagnosis not present

## 2019-11-14 DIAGNOSIS — R634 Abnormal weight loss: Secondary | ICD-10-CM | POA: Diagnosis not present

## 2019-11-14 DIAGNOSIS — Z7982 Long term (current) use of aspirin: Secondary | ICD-10-CM

## 2019-11-14 DIAGNOSIS — K921 Melena: Secondary | ICD-10-CM | POA: Diagnosis not present

## 2019-11-14 DIAGNOSIS — R8271 Bacteriuria: Secondary | ICD-10-CM | POA: Diagnosis not present

## 2019-11-14 DIAGNOSIS — D849 Immunodeficiency, unspecified: Secondary | ICD-10-CM | POA: Diagnosis not present

## 2019-11-14 DIAGNOSIS — I10 Essential (primary) hypertension: Secondary | ICD-10-CM | POA: Diagnosis present

## 2019-11-14 DIAGNOSIS — Z1211 Encounter for screening for malignant neoplasm of colon: Secondary | ICD-10-CM | POA: Diagnosis not present

## 2019-11-14 DIAGNOSIS — Z86718 Personal history of other venous thrombosis and embolism: Secondary | ICD-10-CM | POA: Diagnosis not present

## 2019-11-14 DIAGNOSIS — D500 Iron deficiency anemia secondary to blood loss (chronic): Secondary | ICD-10-CM | POA: Diagnosis not present

## 2019-11-14 DIAGNOSIS — F329 Major depressive disorder, single episode, unspecified: Secondary | ICD-10-CM | POA: Diagnosis not present

## 2019-11-14 DIAGNOSIS — Z20822 Contact with and (suspected) exposure to covid-19: Secondary | ICD-10-CM | POA: Diagnosis present

## 2019-11-14 DIAGNOSIS — Z833 Family history of diabetes mellitus: Secondary | ICD-10-CM | POA: Diagnosis not present

## 2019-11-14 DIAGNOSIS — M1990 Unspecified osteoarthritis, unspecified site: Secondary | ICD-10-CM | POA: Diagnosis not present

## 2019-11-14 DIAGNOSIS — U071 COVID-19: Secondary | ICD-10-CM | POA: Diagnosis not present

## 2019-11-14 DIAGNOSIS — Z8261 Family history of arthritis: Secondary | ICD-10-CM | POA: Diagnosis not present

## 2019-11-14 DIAGNOSIS — Z86711 Personal history of pulmonary embolism: Secondary | ICD-10-CM | POA: Diagnosis not present

## 2019-11-14 DIAGNOSIS — N184 Chronic kidney disease, stage 4 (severe): Secondary | ICD-10-CM | POA: Diagnosis not present

## 2019-11-14 DIAGNOSIS — Z9889 Other specified postprocedural states: Secondary | ICD-10-CM | POA: Diagnosis not present

## 2019-11-14 DIAGNOSIS — K922 Gastrointestinal hemorrhage, unspecified: Secondary | ICD-10-CM | POA: Diagnosis not present

## 2019-11-14 DIAGNOSIS — N390 Urinary tract infection, site not specified: Secondary | ICD-10-CM | POA: Diagnosis not present

## 2019-11-14 DIAGNOSIS — Z888 Allergy status to other drugs, medicaments and biological substances status: Secondary | ICD-10-CM

## 2019-11-14 DIAGNOSIS — E7800 Pure hypercholesterolemia, unspecified: Secondary | ICD-10-CM | POA: Diagnosis present

## 2019-11-14 DIAGNOSIS — K5731 Diverticulosis of large intestine without perforation or abscess with bleeding: Principal | ICD-10-CM | POA: Diagnosis present

## 2019-11-14 DIAGNOSIS — Z79899 Other long term (current) drug therapy: Secondary | ICD-10-CM

## 2019-11-14 DIAGNOSIS — Z96649 Presence of unspecified artificial hip joint: Secondary | ICD-10-CM | POA: Diagnosis not present

## 2019-11-14 DIAGNOSIS — F4321 Adjustment disorder with depressed mood: Secondary | ICD-10-CM | POA: Diagnosis not present

## 2019-11-14 DIAGNOSIS — I480 Paroxysmal atrial fibrillation: Secondary | ICD-10-CM | POA: Diagnosis present

## 2019-11-14 DIAGNOSIS — I5032 Chronic diastolic (congestive) heart failure: Secondary | ICD-10-CM | POA: Diagnosis not present

## 2019-11-14 DIAGNOSIS — N3001 Acute cystitis with hematuria: Secondary | ICD-10-CM | POA: Diagnosis not present

## 2019-11-14 DIAGNOSIS — Z95818 Presence of other cardiac implants and grafts: Secondary | ICD-10-CM

## 2019-11-14 DIAGNOSIS — D649 Anemia, unspecified: Secondary | ICD-10-CM | POA: Diagnosis not present

## 2019-11-14 DIAGNOSIS — Z8249 Family history of ischemic heart disease and other diseases of the circulatory system: Secondary | ICD-10-CM

## 2019-11-14 DIAGNOSIS — J449 Chronic obstructive pulmonary disease, unspecified: Secondary | ICD-10-CM | POA: Diagnosis present

## 2019-11-14 DIAGNOSIS — K625 Hemorrhage of anus and rectum: Secondary | ICD-10-CM | POA: Diagnosis not present

## 2019-11-14 DIAGNOSIS — T8459XD Infection and inflammatory reaction due to other internal joint prosthesis, subsequent encounter: Secondary | ICD-10-CM | POA: Diagnosis not present

## 2019-11-14 DIAGNOSIS — A0472 Enterocolitis due to Clostridium difficile, not specified as recurrent: Secondary | ICD-10-CM | POA: Diagnosis not present

## 2019-11-14 HISTORY — DX: Gastrointestinal hemorrhage, unspecified: K92.2

## 2019-11-14 LAB — CBC
HCT: 28.8 % — ABNORMAL LOW (ref 36.0–46.0)
Hemoglobin: 8.9 g/dL — ABNORMAL LOW (ref 12.0–15.0)
MCH: 26.3 pg (ref 26.0–34.0)
MCHC: 30.9 g/dL (ref 30.0–36.0)
MCV: 85.2 fL (ref 80.0–100.0)
Platelets: 160 10*3/uL (ref 150–400)
RBC: 3.38 MIL/uL — ABNORMAL LOW (ref 3.87–5.11)
RDW: 19.1 % — ABNORMAL HIGH (ref 11.5–15.5)
WBC: 10.9 10*3/uL — ABNORMAL HIGH (ref 4.0–10.5)
nRBC: 0 % (ref 0.0–0.2)

## 2019-11-14 LAB — CBC WITH DIFFERENTIAL/PLATELET
Abs Immature Granulocytes: 0.04 10*3/uL (ref 0.00–0.07)
Basophils Absolute: 0.1 10*3/uL (ref 0.0–0.1)
Basophils Relative: 1 %
Eosinophils Absolute: 0.1 10*3/uL (ref 0.0–0.5)
Eosinophils Relative: 1 %
HCT: 31.7 % — ABNORMAL LOW (ref 36.0–46.0)
Hemoglobin: 9.8 g/dL — ABNORMAL LOW (ref 12.0–15.0)
Immature Granulocytes: 0 %
Lymphocytes Relative: 7 %
Lymphs Abs: 0.7 10*3/uL (ref 0.7–4.0)
MCH: 26.2 pg (ref 26.0–34.0)
MCHC: 30.9 g/dL (ref 30.0–36.0)
MCV: 84.8 fL (ref 80.0–100.0)
Monocytes Absolute: 0.7 10*3/uL (ref 0.1–1.0)
Monocytes Relative: 7 %
Neutro Abs: 8.5 10*3/uL — ABNORMAL HIGH (ref 1.7–7.7)
Neutrophils Relative %: 84 %
Platelets: 148 10*3/uL — ABNORMAL LOW (ref 150–400)
RBC: 3.74 MIL/uL — ABNORMAL LOW (ref 3.87–5.11)
RDW: 19 % — ABNORMAL HIGH (ref 11.5–15.5)
WBC: 10.1 10*3/uL (ref 4.0–10.5)
nRBC: 0 % (ref 0.0–0.2)

## 2019-11-14 LAB — RESPIRATORY PANEL BY RT PCR (FLU A&B, COVID)
Influenza A by PCR: NEGATIVE
Influenza B by PCR: NEGATIVE
SARS Coronavirus 2 by RT PCR: NEGATIVE

## 2019-11-14 LAB — I-STAT CHEM 8, ED
BUN: 46 mg/dL — ABNORMAL HIGH (ref 8–23)
Calcium, Ion: 1.01 mmol/L — ABNORMAL LOW (ref 1.15–1.40)
Chloride: 103 mmol/L (ref 98–111)
Creatinine, Ser: 2.3 mg/dL — ABNORMAL HIGH (ref 0.44–1.00)
Glucose, Bld: 111 mg/dL — ABNORMAL HIGH (ref 70–99)
HCT: 30 % — ABNORMAL LOW (ref 36.0–46.0)
Hemoglobin: 10.2 g/dL — ABNORMAL LOW (ref 12.0–15.0)
Potassium: 4 mmol/L (ref 3.5–5.1)
Sodium: 138 mmol/L (ref 135–145)
TCO2: 30 mmol/L (ref 22–32)

## 2019-11-14 LAB — OCCULT BLOOD X 1 CARD TO LAB, STOOL: Fecal Occult Bld: POSITIVE — AB

## 2019-11-14 MED ORDER — MYCOPHENOLATE SODIUM 180 MG PO TBEC
180.0000 mg | DELAYED_RELEASE_TABLET | Freq: Two times a day (BID) | ORAL | Status: DC
  Administered 2019-11-14 – 2019-11-18 (×8): 180 mg via ORAL
  Filled 2019-11-14 (×8): qty 1

## 2019-11-14 MED ORDER — LACTATED RINGERS IV SOLN: INTRAVENOUS | Status: DC

## 2019-11-14 MED ORDER — ACETAMINOPHEN 500 MG PO TABS
500.0000 mg | ORAL_TABLET | Freq: Four times a day (QID) | ORAL | Status: DC | PRN
  Administered 2019-11-16 – 2019-11-17 (×2): 500 mg via ORAL
  Filled 2019-11-14 (×2): qty 1

## 2019-11-14 MED ORDER — FUROSEMIDE 80 MG PO TABS: 80.0000 mg | ORAL_TABLET | Freq: Two times a day (BID) | ORAL | Status: DC

## 2019-11-14 MED ORDER — DOCUSATE SODIUM 100 MG PO CAPS: 100.0000 mg | ORAL_CAPSULE | Freq: Every day | ORAL | Status: DC | PRN

## 2019-11-14 MED ORDER — NYSTATIN 100000 UNIT/ML MT SUSP
5.0000 mL | Freq: Four times a day (QID) | OROMUCOSAL | Status: DC
  Administered 2019-11-14 – 2019-11-16 (×8): 500000 [IU] via ORAL
  Filled 2019-11-14 (×8): qty 5

## 2019-11-14 MED ORDER — FERROUS SULFATE 325 (65 FE) MG PO TABS
325.0000 mg | ORAL_TABLET | Freq: Every day | ORAL | Status: DC
  Administered 2019-11-15 – 2019-11-18 (×4): 325 mg via ORAL
  Filled 2019-11-14 (×3): qty 1

## 2019-11-14 MED ORDER — MAGNESIUM OXIDE 400 (241.3 MG) MG PO TABS
800.0000 mg | ORAL_TABLET | Freq: Two times a day (BID) | ORAL | Status: DC
  Administered 2019-11-14 – 2019-11-18 (×8): 800 mg via ORAL
  Filled 2019-11-14 (×8): qty 2

## 2019-11-14 MED ORDER — CARVEDILOL 12.5 MG PO TABS
12.5000 mg | ORAL_TABLET | Freq: Two times a day (BID) | ORAL | Status: DC
  Administered 2019-11-14: 12.5 mg via ORAL
  Filled 2019-11-14: qty 1

## 2019-11-14 MED ORDER — DULOXETINE HCL 30 MG PO CPEP
30.0000 mg | ORAL_CAPSULE | Freq: Every day | ORAL | Status: DC
  Administered 2019-11-15 – 2019-11-18 (×4): 30 mg via ORAL
  Filled 2019-11-14 (×5): qty 1

## 2019-11-14 MED ORDER — ALBUTEROL SULFATE (2.5 MG/3ML) 0.083% IN NEBU: 2.5000 mg | INHALATION_SOLUTION | Freq: Four times a day (QID) | RESPIRATORY_TRACT | Status: DC | PRN

## 2019-11-14 MED ORDER — VITAMIN C 250 MG PO TABS
250.0000 mg | ORAL_TABLET | Freq: Every day | ORAL | Status: DC
  Administered 2019-11-14 – 2019-11-18 (×5): 250 mg via ORAL
  Filled 2019-11-14 (×5): qty 1

## 2019-11-14 MED ORDER — TACROLIMUS 1 MG PO CAPS
2.0000 mg | ORAL_CAPSULE | Freq: Two times a day (BID) | ORAL | Status: DC
  Administered 2019-11-14 – 2019-11-18 (×8): 2 mg via ORAL
  Filled 2019-11-14 (×8): qty 2

## 2019-11-14 MED ORDER — BENZONATATE 100 MG PO CAPS: 100.0000 mg | ORAL_CAPSULE | Freq: Three times a day (TID) | ORAL | Status: DC | PRN

## 2019-11-14 MED ORDER — PREDNISONE 5 MG PO TABS
5.0000 mg | ORAL_TABLET | Freq: Every day | ORAL | Status: DC
  Administered 2019-11-15 – 2019-11-18 (×4): 5 mg via ORAL
  Filled 2019-11-14 (×4): qty 1

## 2019-11-14 MED ORDER — SODIUM CHLORIDE 0.9 % IV SOLN
80.0000 mg | Freq: Two times a day (BID) | INTRAVENOUS | Status: DC
  Administered 2019-11-15 – 2019-11-18 (×8): 80 mg via INTRAVENOUS
  Filled 2019-11-14 (×9): qty 80

## 2019-11-14 MED ORDER — FLUTICASONE FUROATE-VILANTEROL 100-25 MCG/INH IN AEPB
1.0000 | INHALATION_SPRAY | Freq: Every day | RESPIRATORY_TRACT | Status: DC
  Administered 2019-11-16 – 2019-11-18 (×3): 1 via RESPIRATORY_TRACT
  Filled 2019-11-14: qty 28

## 2019-11-14 MED ORDER — ONDANSETRON HCL 4 MG/2ML IJ SOLN: 4.0000 mg | Freq: Four times a day (QID) | INTRAMUSCULAR | Status: DC | PRN

## 2019-11-14 MED ORDER — ONDANSETRON HCL 4 MG PO TABS: 4.0000 mg | ORAL_TABLET | Freq: Four times a day (QID) | ORAL | Status: DC | PRN

## 2019-11-14 MED ORDER — MAGNESIUM 400 MG PO TABS: ORAL_TABLET | Freq: Two times a day (BID) | ORAL | Status: DC

## 2019-11-14 NOTE — H&P (Signed)
History and Physical    [name] AOZ:[id number] DOB: June 29, 1946 DOA: 11/14/2019  Referring MD/NP/PA: Stark Jock, MD PCP: [name], NP  Outpatient Specialists: Nephrology, [facility] Patient coming from: Home  Chief Complaint: Rectal bleeding  HPI: [name] is a 74 y.o. female with medical history significant of ESRD status post renal transplant in 2017, atrial fibrillation status post ablation not on anticoagulation, cardiomyopathy, pacemaker, prior PE who presents to the hospital as a transfer from outside ED due to rectal bleeding.  Patient states that early this morning she woke up with the urge to have a bowel movement.  Upon passing the stool she noticed maroon-colored blood with clots noted.  This eventually stopped and she laid back down to go to sleep and upon waking up she went back to the bathroom with similar result.  Due to this her husband convinced her to go to the emergency room for further evaluation.  She is unable to identify any exacerbating or remitting factors.  She denies any new medications other than recent increase in her prednisone although that has since been tapered back down to her baseline of 5 mg daily.  She denies any issues with her medication.  She admits to daily aspirin use but no other anticoagulation or antiplatelet agents noted.  She denies any lightheadedness or dizziness, chest pain, shortness of breath, nausea, vomiting, fever, or chills.  ED Course: In the ED she was noted to be hemodynamically stable however her hemoglobin had dropped by approximately 1 g from her most recent admission approximately 3 weeks ago.  Dr. Bryan Lemma from GI was called out of the ED and recommended admission to [facility] for observation.  Due to this drop along with the patient being Hemoccult positive she was transferred to [facility] for continued monitoring.  Review of Systems: As per HPI otherwise 10 point review of systems negative.   Past Medical  History:  Diagnosis Date  . Acute diastolic (congestive) heart failure ([place]) 08/12/2017  . AKI (acute kidney injury) ([place]) 11/04/2017  . Anemia   . Anxiety   . Arthritis    "shoulders; arms; hips" (02/04/2018)  . Asthma   . Atrial fibrillation ([place])   . Atrial flutter [facility])    s/p aflutter ablation at [facility]  . Benign hypertension with ESRD (end-stage renal disease) ([place])   . Blood transfusion    never had a reaction to blood transfusion  . Cardiomyopathy Mar 2013   Mild, EF 50-55% by Mar 2013 ECHO, diast dysfxn II  . Cardiomyopathy ([place])   . CHF (congestive heart failure) ([place]) 2005  . CHF (congestive heart failure) ([place])   . Childhood asthma   . Closed fracture of right distal radius 10/18/2016  . CMV (cytomegalovirus infection) ([place]) 10/03/2015  . Constipation    takes Miralax daily  . Constipation   . Depression    takes Zoloft daily  . Distal radius fracture, right   . DVT of lower extremity, bilateral ([place]) 12/21/11   "they're there now; been there for 2 wks"  . Eczema   . ESRD (end stage renal disease) on dialysis [facility]) 09/2011   07-19-2015 had Kidney transplant at [facility]; "don't get dialysis anymore" (02/04/2018)  . Fractures, stress    in both feet--6 OR 7 YRS AGO--RESOLVED  . Generalized edema [id number]  . Gout    doesn't require meds   . HCAP (healthcare-associated pneumonia) 09/10/2011  . Hearing difficulty   . High cholesterol   . History  of CVA (cerebrovascular accident) without residual deficits   . History of hip replacement, total, right   . History of pneumonia   . Hx of Clostridium difficile infection   . Hypercalcemia    09/10/11  . Hyperparathyroidism ([place]) 04/07/2013  . Hypertension    takes Diltiazem daily   . Hypomagnesemia 08/02/2015  . Hypophosphatemia 11/08/2017  . ICB (intracranial bleed) ([place])   . Immunosuppression ([place]) 07/25/2015  . Memory changes   . Morbid obesity ([place])   . Nonischemic dilated cardiomyopathy ([place])   . Obesity 04/07/2013    . Oligouria   . OSA on CPAP   . PAF (paroxysmal atrial fibrillation) (HCC)     HX OF CEREBRAL BLEED WHILE ON COUMADIN-SO PT NOT ON ANY BLOOD THINNERS NOW  . Peripheral vascular disease ([place])   . Presence of Watchman left atrial appendage closure device 10/04/2017  . Proteinuria 09/04/2016  . Psoriasis 08/07/2016  . 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 S/P insertion of IVC (inferior vena caval) filter   . Sepsis ([place]) 11/08/2017  . Stress fracture    bilateral feet  . Stroke [facility]) 2009   denies residual on 02/04/2018;  hemorrhagic now off coumadin  . Stroke due to intracerebral hemorrhage ([place]) 2009  . Suture reaction 09/04/2016  . Transfusion history   . Use of cane as ambulatory aid     Past Surgical History:  Procedure Laterality Date  . AV FISTULA PLACEMENT  09/28/2011   Procedure: ARTERIOVENOUS (AV) FISTULA CREATION;  Surgeon: Rosetta Posner, MD;  Location: [place];  Service: Vascular;  Laterality: Left;  . CARDIAC CATHETERIZATION    . CARDIAC VALVE SURGERY  1960    FOR ATRIAL SEPTAL DEFECT  . CHOLECYSTECTOMY     1980's  . COLONOSCOPY    . CYSTOSCOPY     many yrs ago  . FEMUR FRACTURE SURGERY Right 03/2011; 09/03/2011   "had 2, 2 wk apart in 2012; broke it again 08/2011 & had OR"  . FRACTURE SURGERY    . I & D EXTREMITY  09/15/2011   Procedure: IRRIGATION AND DEBRIDEMENT EXTREMITY w REMOVAL OF HARDWARE;  Surgeon: [name], MD;  Location: [place];  Service: Orthopedics;  Laterality: Right;  . INCISION AND DRAINAGE HIP  12/21/2011   Procedure: IRRIGATION AND DEBRIDEMENT HIP;  Surgeon: [name], MD;  Location: [place];  Service: Orthopedics;  Laterality: Right;  I&D RIGHT HIP WITH PLACEMENT ANTIBIOTIC SPACER  . INSERTION OF DIALYSIS CATHETER     Procedure: INSERTION OF DIALYSIS CATHETER;  Surgeon: Rosetta Posner, MD;  Location: [place];  Service: Vascular;  Laterality: Right;  . IRRIGATION AND DEBRIDEMENT ABSCESS  12/21/11   right hip  . JOINT REPLACEMENT    . KIDNEY  TRANSPLANT  07/19/15  . LAPAROSCOPIC GASTRIC BANDING  2008  . LEFT ATRIAL APPENDAGE OCCLUSION  10/04/2017  . OPEN REDUCTION INTERNAL FIXATION (ORIF) DISTAL RADIAL FRACTURE Right 10/18/2016   Procedure: OPEN REDUCTION INTERNAL FIXATION (ORIF) RIGHT DISTAL RADIAL FRACTURE;  Surgeon: Marchia Bond, MD;  Location: [place];  Service: Orthopedics;  Laterality: Right;  . TOTAL HIP ARTHROPLASTY Right 02/2011   right THA 02/2011, I&D/removal of hardware 09/2011,, repeat I&D Jun 2013, reimplantation R THA 03-26-2012  . TOTAL HIP REVISION  03/25/2012   Procedure: TOTAL HIP REVISION;  Surgeon: [name], MD;  Location: [facility];  Service: Orthopedics;  Laterality: Right;  Right Total Hip Reimplantation  . TUBAL LIGATION    . VENA CAVA  FILTER PLACEMENT  11/2011     reports that she has never smoked. She has never used smokeless tobacco. She reports that she does not drink alcohol or use drugs.  Allergies  Allergen Reactions  . Ace Inhibitors Other (See Comments)    Reaction unknown  . 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 Warfarin Other (See Comments)    Left side brain hemorrhage  . Warfarin Sodium Other (See Comments)    Caused her to have a stroke    Family History  Problem Relation Age of Onset  . Cancer Father   . Diabetes Mother   . Hypertension Mother   . Arthritis Mother   . Hodgkin's lymphoma Other 32       dscd---HODGKINS DISEASE  . Hypertension Brother    Prior to Admission medications   Medication Sig Start Date End Date Taking? Authorizing Provider  acetaminophen (TYLENOL) 500 MG tablet Take 500 mg by mouth every 6 (six) hours as needed.    [provider]  albuterol (PROVENTIL) (2.5 MG/3ML) 0.083% nebulizer solution Take 3 mLs (2.5 mg total) by nebulization every 6 (six) hours as needed for wheezing or shortness of breath. Dx:J45.909 10/25/19   Rai, Vernelle Emerald, MD  aspirin EC 81 MG tablet  Take 81 mg by mouth daily.  07/21/15   [provider]  benzonatate (TESSALON) 100 MG capsule Take 1 capsule (100 mg total) by mouth 3 (three) times daily as needed for cough. 10/25/19   Rai, Vernelle Emerald, MD  carvedilol (COREG) 12.5 MG tablet Take 1 tablet (12.5 mg total) by mouth 2 (two) times daily. Patient not taking: Reported on 11/04/2019 10/25/19   Rai, Vernelle Emerald, MD  carvedilol (COREG) 25 MG tablet Take 25 mg by mouth 2 (two) times daily with a meal.    [provider]  docusate sodium (COLACE) 100 MG capsule Take 100 mg by mouth daily as needed (For constipation.).     [provider]  DULoxetine (CYMBALTA) 30 MG capsule Take 1 capsule (30 mg total) by mouth daily. 10/28/19   [name], [name], NP  Ferrous Sulfate (IRON PO) Take 325 mg by mouth daily.     [provider]  fluticasone furoate-vilanterol (BREO ELLIPTA) 100-25 MCG/INH AEPB Inhale 1 puff into the lungs daily. 09/23/19   [name], MD  furosemide (LASIX) 80 MG tablet Take 1 tablet (80 mg total) by mouth 2 (two) times daily. 10/25/19   Rai, Vernelle Emerald, MD  HYDROcodone-homatropine (HYCODAN) 5-1.5 MG/5ML syrup Take 5 mLs by mouth every 8 (eight) hours as needed for cough. Patient not taking: Reported on 11/04/2019 10/25/19   Rai, Vernelle Emerald, MD  Iron-Vitamin C (VITRON-C) 65-125 MG TABS Take 1 tablet by mouth daily.    [provider]  Magnesium 400 MG TABS Take by mouth 2 (two) times daily.    [provider]  magnesium oxide (MAG-OX) 400 MG tablet Take 2 tablets by mouth 2 (two) times daily. 10/18/19   [provider]  mycophenolate (MYFORTIC) 180 MG EC tablet Take 180 mg by mouth 2 (two) times daily.    [provider]  nystatin (MYCOSTATIN) 100000 UNIT/ML suspension Take 5 mLs (500,000 Units total) by mouth 4 (four) times daily. 10/28/19   [name], [name], NP  potassium chloride SA (K-DUR,KLOR-CON) 20 MEQ tablet Take 20 mEq by mouth daily.     [provider]  predniSONE (  DELTASONE) 5 MG tablet Take 1 tablet (5 mg total) by mouth daily with breakfast. Please resume after you have completed prednisone taper 10/25/19   Rai, Ripudeep K, MD  SODIUM BICARBONATE PO Take by mouth. 10 grain--taking 2 tab PO BID    [provider]  tacrolimus (PROGRAF) 1 MG capsule Take 2 mg by mouth 2 (two) times daily.     [provider]    Physical Exam: Vitals:   11/14/19 1910 11/14/19 1930 11/14/19 2111 11/14/19 2334  BP: 123/68 121/70 127/69 103/60  Pulse: 70 70 70 75  Resp: 20 (!) 22 (!) 23 20  Temp:   97.9 F (36.6 C) 98.2 F (36.8 C)  TempSrc:   Oral Oral  SpO2: 99% 99% 100% 99%  Weight:      Height:   5\' 5"  (1.651 m)       Constitutional: NAD, calm, comfortable Vitals:   11/14/19 1910 11/14/19 1930 11/14/19 2111 11/14/19 2334  BP: 123/68 121/70 127/69 103/60  Pulse: 70 70 70 75  Resp: 20 (!) 22 (!) 23 20  Temp:   97.9 F (36.6 C) 98.2 F (36.8 C)  TempSrc:   Oral Oral  SpO2: 99% 99% 100% 99%  Weight:      Height:   5\' 5"  (1.651 m)    Eyes: PERRL, lids and conjunctivae normal ENMT: Mucous membranes are moist. Posterior pharynx clear of any exudate or lesions Neck: normal, supple, no masses, no thyromegaly Respiratory: clear to auscultation bilaterally, no wheezing, no crackles. Normal respiratory effort. No accessory muscle use.  Cardiovascular: Regular rate and rhythm, no murmurs / rubs / gallops. No extremity edema. 2+ pedal pulses.   Abdomen: no tenderness, no masses palpated. No hepatosplenomegaly. Bowel sounds positive.  Musculoskeletal: no clubbing / cyanosis. No joint deformity upper and lower extremities. Good ROM, no contractures. Normal muscle tone.  Skin: no rashes, lesions, ulcers. No induration Neurologic: CN 2-12 grossly intact. Sensation intact.  Strength 5/5 in all 4.  Psychiatric: Normal judgment and insight. Alert and oriented x 3. Normal mood.   Labs on Admission: I have personally  reviewed following labs and imaging studies  CBC: Recent Labs  Lab 11/14/19 1553 11/14/19 1608 11/14/19 2310  WBC 10.1  --  10.9*  NEUTROABS 8.5*  --   --   HGB 9.8* 10.2* 8.9*  HCT 31.7* 30.0* 28.8*  MCV 84.8  --  85.2  PLT 148*  --  542   Basic Metabolic Panel: Recent Labs  Lab 11/14/19 1608  NA 138  K 4.0  CL 103  GLUCOSE 111*  BUN 46*  CREATININE 2.30*   GFR: Estimated Creatinine Clearance: 20.2 mL/min (A) (by C-G formula based on SCr of 2.3 mg/dL (H)). Liver Function Tests: No results for input(s): AST, ALT, ALKPHOS, BILITOT, PROT, ALBUMIN in the last 168 hours. No results for input(s): LIPASE, AMYLASE in the last 168 hours. No results for input(s): AMMONIA in the last 168 hours. Coagulation Profile: No results for input(s): INR, PROTIME in the last 168 hours. Cardiac Enzymes: No results for input(s): CKTOTAL, CKMB, CKMBINDEX, TROPONINI in the last 168 hours. BNP (last 3 results) No results for input(s): PROBNP in the last 8760 hours. HbA1C: No results for input(s): HGBA1C in the last 72 hours. CBG: No results for input(s): GLUCAP in the last 168 hours. Lipid Profile: No results for input(s): CHOL, HDL, LDLCALC, TRIG, CHOLHDL, LDLDIRECT in the last 72 hours. Thyroid Function Tests: No results for input(s): TSH, T4TOTAL, FREET4, T3FREE,  THYROIDAB in the last 72 hours. Anemia Panel: No results for input(s): VITAMINB12, FOLATE, FERRITIN, TIBC, IRON, RETICCTPCT in the last 72 hours. Urine analysis:    Component Value Date/Time   COLORURINE AMBER (A) 02/04/2018 1540   APPEARANCEUR CLOUDY (A) 02/04/2018 1540   LABSPEC 1.017 02/04/2018 1540   PHURINE 5.0 02/04/2018 1540   GLUCOSEU NEGATIVE 02/04/2018 1540   HGBUR LARGE (A) 02/04/2018 1540   HGBUR negative 10/04/2009 1044   BILIRUBINUR NEGATIVE 02/04/2018 1540   BILIRUBINUR negative 12/13/2017 1508   KETONESUR NEGATIVE 02/04/2018 1540   PROTEINUR 100 (A) 02/04/2018 1540   UROBILINOGEN 0.2 12/13/2017 1508    UROBILINOGEN 0.2 03/25/2012 1354   NITRITE NEGATIVE 02/04/2018 1540   LEUKOCYTESUR MODERATE (A) 02/04/2018 1540   Sepsis Labs: @LABRCNTIP (procalcitonin:4,lacticidven:4) ) Recent Results (from the past 240 hour(s))  Respiratory Panel by RT PCR (Flu A&B, Covid) - Nasopharyngeal Swab     Status: None   Collection Time: 11/14/19  5:27 PM   Specimen: Nasopharyngeal Swab  Result Value Ref Range Status   SARS Coronavirus 2 by RT PCR NEGATIVE NEGATIVE Final    Comment: (NOTE) SARS-CoV-2 target nucleic acids are NOT DETECTED. The SARS-CoV-2 RNA is generally detectable in upper respiratoy specimens during the acute phase of infection. The lowest concentration of SARS-CoV-2 viral copies this assay can detect is 131 copies/mL. A negative result does not preclude SARS-Cov-2 infection and should not be used as the sole basis for treatment or other patient management decisions. A negative result may occur with  improper specimen collection/handling, submission of specimen other than nasopharyngeal swab, presence of viral mutation(s) within the areas targeted by this assay, and inadequate number of viral copies (<131 copies/mL). A negative result must be combined with clinical observations, patient history, and epidemiological information. The expected result is Negative. Fact Sheet for Patients:  PinkCheek.be Fact Sheet for Healthcare Providers:  GravelBags.it This test is not yet ap proved or cleared by the [place] FDA and  has been authorized for detection and/or diagnosis of SARS-CoV-2 by FDA under an Emergency Use Authorization (EUA). This EUA will remain  in effect (meaning this test can be used) for the duration of the COVID-19 declaration under Section 564(b)(1) of the Act, 21 U.S.C. section 360bbb-3(b)(1), unless the authorization is terminated or revoked sooner.    Influenza A by PCR NEGATIVE NEGATIVE Final   Influenza B  by PCR NEGATIVE NEGATIVE Final    Comment: (NOTE) The Xpert Xpress SARS-CoV-2/FLU/RSV assay is intended as an aid in  the diagnosis of influenza from Nasopharyngeal swab specimens and  should not be used as a sole basis for treatment. Nasal washings and  aspirates are unacceptable for Xpert Xpress SARS-CoV-2/FLU/RSV  testing. Fact Sheet for Patients: PinkCheek.be Fact Sheet for Healthcare Providers: GravelBags.it This test is not yet approved or cleared by the [place] FDA and  has been authorized for detection and/or diagnosis of SARS-CoV-2 by  FDA under an Emergency Use Authorization (EUA). This EUA will remain  in effect (meaning this test can be used) for the duration of the  Covid-19 declaration under Section 564(b)(1) of the Act, 21  U.S.C. section 360bbb-3(b)(1), unless the authorization is  terminated or revoked. Performed at [facility], [address] [place], [place] [address]      Radiological Exams on Admission: No results found.  EKG: Independently reviewed. Ventricular pacing  Assessment/Plan Active Problems:   Essential hypertension   PAF (paroxysmal atrial fibrillation) (HCC)   Anemia   Personal  history of DVT (deep vein thrombosis)   Renal transplant recipient   Immunosuppression (HCC)   Acute GI bleeding   GI bleed   #Acute blood loss anemia/GI bleed -Patient with approximately 1 g drop in hemoglobin from 3 weeks ago. -Positive Hemoccult with rectal bleeding noted at home as well as on arrival to [facility]. -We will repeat CBC now and again in the morning. -We will allow for ice chips until midnight and then n.p.o. at midnight. -IV Protonix 80 mg twice daily -GI consulted of the ED.  We will need to touch base with them in the morning pending results of repeat CBC.  #Renal transplant recipient/immunosuppression -Continuing prednisone, mycophenolate, and Prograf. -We will  check Prograf level in the morning.  Will need to be checked around 8 AM for 12-hour trough.  Goal trough of 5-8 based on most recent nephrology note  #Atrial fibrillation status post watchman procedure -Not on anticoagulation.  Will monitor. -Patient did have history of intracerebral hemorrhage in 2010 while on Coumadin.  #Essential hypertension -Continuing home antihypertensive agents.     DVT prophylaxis: None due to active bleed Code Status: Full Family Communication: None Disposition Plan: Home to self care pending GI work up and medical stabilization Consults called: None. GI called by outside ED Admission status: inpatient   Arlan Organ DO [facility]  11/14/2019, 11:41 PM

## 2019-11-14 NOTE — Progress Notes (Addendum)
Patient arrived on the unit from [place] via [facility], assessment completed see flow sheet, palced on tele ccmd notified, patient oriented to room and staff, bed in lowest position call bell within reach will continue to monitor. [facility] admits notified of patient's arrival.

## 2019-11-14 NOTE — ED Notes (Signed)
Attempted IV to right arm x3, small amt of blood obtained for labs, tol well, unsuccessful for IV

## 2019-11-14 NOTE — ED Provider Notes (Signed)
[facility] EMERGENCY DEPARTMENT Provider Note   CSN: [id number] Arrival date & time: 11/14/19  1426     History Chief Complaint  Patient presents with  . Rectal Bleeding    [name] is a 74 y.o. female.  Patient is a 74 year old female with extensive past medical history including atrial fibrillation of, cardiomyopathy, CHF, pacemaker, chronic renal insufficiency, prior PE.  She presents today for evaluation of rectal bleeding.  Patient states that she woke up this morning with the urge to have a bowel movement, then passed formed stool along with maroon-colored blood and clots.  She has had several additional bowel movements which has been nothing but maroon-colored stool.  Patient denies any pain or fever.  She denies lightheadedness or dizziness, but does feel weak.  She tells me that she had a colonoscopy in the past, but is uncertain as to how long ago and believes the study was unremarkable.  The history is provided by the patient.  Rectal Bleeding Quality:  Maroon Amount:  Moderate Duration:  8 hours Timing:  Intermittent Chronicity:  New Relieved by:  Nothing Worsened by:  Nothing Ineffective treatments:  None tried Associated symptoms: no abdominal pain, no fever, no hematemesis and no light-headedness        Past Medical History:  Diagnosis Date  . Acute diastolic (congestive) heart failure ([place]) 08/12/2017  . AKI (acute kidney injury) ([place]) 11/04/2017  . Anemia   . Anxiety   . Arthritis    "shoulders; arms; hips" (02/04/2018)  . Asthma   . Atrial fibrillation ([place])   . Atrial flutter [facility])    s/p aflutter ablation at [facility]  . Benign hypertension with ESRD (end-stage renal disease) ([place])   . Blood transfusion    never had a reaction to blood transfusion  . Cardiomyopathy Mar 2013   Mild, EF 50-55% by Mar 2013 ECHO, diast dysfxn II  . Cardiomyopathy ([place])   . CHF (congestive heart failure) ([place]) 2005  . CHF (congestive heart failure) ([place])     . Childhood asthma   . Closed fracture of right distal radius 10/18/2016  . CMV (cytomegalovirus infection) ([place]) 10/03/2015  . Constipation    takes Miralax daily  . Constipation   . Depression    takes Zoloft daily  . Distal radius fracture, right   . DVT of lower extremity, bilateral ([place]) 12/21/11   "they're there now; been there for 2 wks"  . Eczema   . ESRD (end stage renal disease) on dialysis [facility]) 09/2011   07-19-2015 had Kidney transplant at [facility]; "don't get dialysis anymore" (02/04/2018)  . Fractures, stress    in both feet--6 OR 7 YRS AGO--RESOLVED  . Generalized edema [id number]  . Gout    doesn't require meds   . HCAP (healthcare-associated pneumonia) 09/10/2011  . Hearing difficulty   . High cholesterol   . History of CVA (cerebrovascular accident) without residual deficits   . History of hip replacement, total, right   . History of pneumonia   . Hx of Clostridium difficile infection   . Hypercalcemia    09/10/11  . Hyperparathyroidism ([place]) 04/07/2013  . Hypertension    takes Diltiazem daily   . Hypomagnesemia 08/02/2015  . Hypophosphatemia 11/08/2017  . ICB (intracranial bleed) ([employer])   . Immunosuppression ([place]) 07/25/2015  . Memory changes   . Morbid obesity ([place])   . Nonischemic dilated cardiomyopathy ([place])   . Obesity 04/07/2013  . Oligouria   . OSA on CPAP   .  PAF (paroxysmal atrial fibrillation) ([facility])     HX OF CEREBRAL BLEED WHILE ON COUMADIN-SO PT NOT ON ANY BLOOD THINNERS NOW  . Peripheral vascular disease ([place])   . Presence of Watchman left atrial appendage closure device 10/04/2017  . Proteinuria 09/04/2016  . Psoriasis 08/07/2016  . 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 S/P insertion of IVC (inferior vena caval) filter   . Sepsis ([place]) 11/08/2017  . Stress fracture    bilateral feet  . Stroke [facility]) 2009   denies residual on 02/04/2018;  hemorrhagic now off coumadin  . Stroke due to intracerebral hemorrhage ([place]) 2009  . Suture reaction  09/04/2016  . Transfusion history   . Use of cane as ambulatory aid     Patient Active Problem List   Diagnosis Date Noted  . Medication management 11/04/2019  . Acute CHF (congestive heart failure) ([place]) 10/23/2019  . Acute on chronic combined systolic and diastolic congestive heart failure ([place]) 10/22/2019  . COPD exacerbation ([place])   . S/P AV nodal ablation 10/08/2018  . Cardiac pacemaker in situ 04/18/2018  . History of amiodarone therapy 03/07/2018  . Oral mucositis 02/22/2018  . 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 Personal history of PE (pulmonary embolism) 02/22/2018  . Acute on chronic systolic heart failure ([place]) 02/11/2018  . Hyperkalemia 02/03/2018  . Atrial fibrillation with RVR ([place]) 02/03/2018  . Lower extremity edema 12/10/2017  . Acute kidney injury superimposed on chronic kidney disease ([place]) 11/17/2017  . Acute diastolic (congestive) heart failure ([place]) 11/17/2017  . UTI (urinary tract infection) 11/03/2017  . Diarrhea 11/03/2017  . Presence of Watchman left atrial appendage closure device 10/04/2017  . Chronic respiratory failure with hypoxia ([place]) 10/03/2017  . Persistent atrial fibrillation ([place]) 08/12/2017  . Dyspnea 07/26/2017  . Atrial flutter ([place])   . Peripheral vascular disease ([place])   . Oligouria   . Hypertension   . Fractures, stress   . Eczema   . Distal radius fracture, right   . Depression   . Constipation   . Arthritis   . Exacerbation of asthma 04/01/2017  . Closed fracture of right distal radius 10/18/2016  . Proteinuria 09/04/2016  . 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 Immunosuppression ([place]) 07/25/2015  . Nonischemic dilated cardiomyopathy ([place])   . Use of cane as ambulatory aid 04/07/2013  . Expected blood loss anemia 03/26/2012  . S/P right TH revision 03/25/2012  . DVT of lower extremity, bilateral ([place]) 12/21/2011  . Personal history of DVT (deep vein thrombosis) 12/08/2011  . Obstructive sleep apnea 12/08/2011    . End stage renal disease ([place]) 11/06/2011  . 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[place]) 10/24/2011  . Pseudomonas aeruginosa infection 10/24/2011  . ESRD (end stage renal disease) on dialysis ([place]) 09/14/2011  . Hypercalcemia   . Gout 09/10/2011  . Absence of right hip joint with antibiotic pacer 09/03/2011  . Traumatic seroma of thigh ([place]) 06/11/2011  . Clostridium difficile colitis 06/09/2011  . Leukocytosis 06/08/2011  . Anemia 06/06/2011  . ARF (acute renal failure) ([place]) 06/06/2011  . SINUSITIS- ACUTE-NOS 09/27/2010  . PAF (paroxysmal atrial fibrillation) ([place]) 11/25/2008  . RENAL FAILURE, ACUTE 11/25/2008  . CVA 09/28/2008  . CVA (cerebrovascular accident due to intracerebral hemorrhage) ([place]) 09/28/2008  . WEAKNESS, LEFT SIDE OF BODY 09/21/2008  . Morbid obesity ([place]) 10/20/2007  . Psoriasis 10/20/2007  . Stroke due to intracerebral hemorrhage ([place]) 07/17/2007  . Stroke ([place]) 07/17/2007  . Essential hypertension 07/07/2007  . Asthma 07/07/2007  Past Surgical History:  Procedure Laterality Date  . AV FISTULA PLACEMENT  09/28/2011   Procedure: ARTERIOVENOUS (AV) FISTULA CREATION;  Surgeon: Rosetta Posner, MD;  Location: [facility];  Service: Vascular;  Laterality: Left;  . CARDIAC CATHETERIZATION    . CARDIAC VALVE SURGERY  1960    FOR ATRIAL SEPTAL DEFECT  . CHOLECYSTECTOMY     1980's  . COLONOSCOPY    . CYSTOSCOPY     many yrs ago  . FEMUR FRACTURE SURGERY Right 03/2011; 09/03/2011   "had 2, 2 wk apart in 2012; broke it again 08/2011 & had OR"  . FRACTURE SURGERY    . I & D EXTREMITY  09/15/2011   Procedure: IRRIGATION AND DEBRIDEMENT EXTREMITY w REMOVAL OF HARDWARE;  Surgeon: [name], MD;  Location: [facility];  Service: Orthopedics;  Laterality: Right;  . INCISION AND DRAINAGE HIP  12/21/2011   Procedure: IRRIGATION AND DEBRIDEMENT HIP;  Surgeon: [name], MD;  Location: [facility];  Service: Orthopedics;  Laterality: Right;  I&D RIGHT HIP WITH PLACEMENT ANTIBIOTIC SPACER  .  INSERTION OF DIALYSIS CATHETER     Procedure: INSERTION OF DIALYSIS CATHETER;  Surgeon: Rosetta Posner, MD;  Location: [facility];  Service: Vascular;  Laterality: Right;  . IRRIGATION AND DEBRIDEMENT ABSCESS  12/21/11   right hip  . JOINT REPLACEMENT    . KIDNEY TRANSPLANT  07/19/15  . LAPAROSCOPIC GASTRIC BANDING  2008  . LEFT ATRIAL APPENDAGE OCCLUSION  10/04/2017  . OPEN REDUCTION INTERNAL FIXATION (ORIF) DISTAL RADIAL FRACTURE Right 10/18/2016   Procedure: OPEN REDUCTION INTERNAL FIXATION (ORIF) RIGHT DISTAL RADIAL FRACTURE;  Surgeon: Marchia Bond, MD;  Location: [facility];  Service: Orthopedics;  Laterality: Right;  . TOTAL HIP ARTHROPLASTY Right 02/2011   right THA 02/2011, I&D/removal of hardware 09/2011,, repeat I&D Jun 2013, reimplantation R THA 03-26-2012  . TOTAL HIP REVISION  03/25/2012   Procedure: TOTAL HIP REVISION;  Surgeon: [name], MD;  Location: [facility];  Service: Orthopedics;  Laterality: Right;  Right Total Hip Reimplantation  . TUBAL LIGATION    . VENA CAVA FILTER PLACEMENT  11/2011     OB History   No obstetric history on file.     Family History  Problem Relation Age of Onset  . Cancer Father   . Diabetes Mother   . Hypertension Mother   . Arthritis Mother   . Hodgkin's lymphoma Other 32       dscd---HODGKINS DISEASE  . Hypertension Brother     Social History   Tobacco Use  . Smoking status: Never Smoker  . Smokeless tobacco: Never Used  Substance Use Topics  . Alcohol use: Never  . Drug use: Never    Home Medications Prior to Admission medications   Medication Sig Start Date End Date Taking? Authorizing Provider  acetaminophen (TYLENOL) 500 MG tablet Take 500 mg by mouth every 6 (six) hours as needed.    [provider]  albuterol (PROVENTIL) (2.5 MG/3ML) 0.083% nebulizer solution Take 3 mLs (2.5 mg total) by nebulization every 6 (six) hours as needed for wheezing or shortness of breath. Dx:J45.909 10/25/19   Rai, Vernelle Emerald, MD    aspirin EC 81 MG tablet Take 81 mg by mouth daily.  07/21/15   [provider]  benzonatate (TESSALON) 100 MG capsule Take 1 capsule (100 mg total) by mouth 3 (three) times daily as needed for cough. 10/25/19   Rai, Vernelle Emerald, MD  carvedilol (COREG) 12.5  MG tablet Take 1 tablet (12.5 mg total) by mouth 2 (two) times daily. Patient not taking: Reported on 11/04/2019 10/25/19   Rai, Vernelle Emerald, MD  carvedilol (COREG) 25 MG tablet Take 25 mg by mouth 2 (two) times daily with a meal.    [provider]  docusate sodium (COLACE) 100 MG capsule Take 100 mg by mouth daily as needed (For constipation.).     [provider]  DULoxetine (CYMBALTA) 30 MG capsule Take 1 capsule (30 mg total) by mouth daily. 10/28/19   [name], [name], NP  Ferrous Sulfate (IRON PO) Take 325 mg by mouth daily.     [provider]  fluticasone furoate-vilanterol (BREO ELLIPTA) 100-25 MCG/INH AEPB Inhale 1 puff into the lungs daily. 09/23/19   [name], MD  furosemide (LASIX) 80 MG tablet Take 1 tablet (80 mg total) by mouth 2 (two) times daily. 10/25/19   Rai, Vernelle Emerald, MD  HYDROcodone-homatropine (HYCODAN) 5-1.5 MG/5ML syrup Take 5 mLs by mouth every 8 (eight) hours as needed for cough. Patient not taking: Reported on 11/04/2019 10/25/19   Rai, Vernelle Emerald, MD  Iron-Vitamin C (VITRON-C) 65-125 MG TABS Take 1 tablet by mouth daily.    [provider]  Magnesium 400 MG TABS Take by mouth 2 (two) times daily.    [provider]  magnesium oxide (MAG-OX) 400 MG tablet Take 2 tablets by mouth 2 (two) times daily. 10/18/19   [provider]  mycophenolate (MYFORTIC) 180 MG EC tablet Take 180 mg by mouth 2 (two) times daily.    [provider]  nystatin (MYCOSTATIN) 100000 UNIT/ML suspension Take 5 mLs (500,000 Units total) by mouth 4 (four) times daily. 10/28/19   [name], [name], NP  potassium chloride SA (K-DUR,KLOR-CON) 20 MEQ tablet Take 20 mEq by mouth  daily.     [provider]  predniSONE (DELTASONE) 5 MG tablet Take 1 tablet (5 mg total) by mouth daily with breakfast. Please resume after you have completed prednisone taper 10/25/19   Rai, Ripudeep K, MD  SODIUM BICARBONATE PO Take by mouth. 10 grain--taking 2 tab PO BID    [provider]  tacrolimus (PROGRAF) 1 MG capsule Take 2 mg by mouth 2 (two) times daily.     [provider]    Allergies    Ace inhibitors, Amiodarone, Warfarin, and Warfarin sodium  Review of Systems   Review of Systems  Constitutional: Negative for fever.  Gastrointestinal: Positive for hematochezia. Negative for abdominal pain and hematemesis.  Neurological: Negative for light-headedness.  All other systems reviewed and are negative.   Physical Exam Updated Vital Signs BP 119/71   Pulse 72   Temp 97.8 F (36.6 C) (Oral)   Resp (!) 23   Ht 5\' 6"  (1.676 m)   Wt 63 kg   SpO2 97%   BMI 22.44 kg/m   Physical Exam Vitals and nursing note reviewed.  Constitutional:      General: She is not in acute distress.    Appearance: She is well-developed. She is not diaphoretic.  HENT:     Head: Normocephalic and atraumatic.  Cardiovascular:     Rate and Rhythm: Normal rate and regular rhythm.     Heart sounds: No murmur. No friction rub. No gallop.   Pulmonary:     Effort: Pulmonary effort is normal. No respiratory distress.     Breath sounds: Normal breath sounds. No wheezing.  Abdominal:     General: Bowel sounds  are normal. There is no distension.     Palpations: Abdomen is soft.     Tenderness: There is no abdominal tenderness.  Genitourinary:    Comments: Rectal exam reveals maroon-colored stool.  There is no obvious hemorrhoids or fissures noted. Musculoskeletal:        General: Normal range of motion.     Cervical back: Normal range of motion and neck supple.  Skin:    General: Skin is warm and dry.  Neurological:     Mental Status: She is alert and oriented to  person, place, and time.     ED Results / Procedures / Treatments   Labs (all labs ordered are listed, but only abnormal results are displayed) Labs Reviewed - No data to display  EKG EKG Interpretation  Date/Time:  Saturday Nov 14 2019 15:04:11 EDT Ventricular Rate:  71 PR Interval:    QRS Duration: 147 QT Interval:  440 QTC Calculation: 479 R Axis:   -68 Text Interpretation: VENTRICULAR PACED RHYTHM Confirmed by Veryl Speak [phone]) on 11/14/2019 3:09:03 PM   Radiology No results found.  Procedures Procedures (including critical care time)  Medications Ordered in ED Medications - No data to display  ED Course  I have reviewed the triage vital signs and the nursing notes.  Pertinent labs & imaging results that were available during my care of the patient were reviewed by me and considered in my medical decision making (see chart for details).    MDM Rules/Calculators/A&P  Patient is a 74 year old female with extensive medical history as described in the HPI.  She presents today with several episodes of bloody stool.  Patient has maroon-colored stool on rectal exam that is heme positive.  She is hemodynamically stable, but appears to have a 1 g drop of her hemoglobin.  Care discussed with Dr. Bryan Lemma from GI.  It is his recommendation that the patient be observed overnight.  If her hemoglobin continues to drop, she may require transfusion or possibly intervention.  Patient to be admitted to the hospitalist service under the care of Dr. Doristine Bosworth.  Final Clinical Impression(s) / ED Diagnoses Final diagnoses:  None    Rx / DC Orders ED Discharge Orders    None       Veryl Speak, MD 11/14/19 [phone]

## 2019-11-14 NOTE — ED Triage Notes (Signed)
Pt reports blood in stool today. Pt endorses 3 episodes. Pt denies pain. Pt also c/o weakness and sob.

## 2019-11-14 NOTE — ED Notes (Signed)
[facility] notified (Tara) - patient ready for transport 

## 2019-11-14 NOTE — Progress Notes (Signed)
IV team unable to start an IV, awaiting another IV team nurse to come and try to get an IV, Thomas MD notified, will continue to monitor.

## 2019-11-14 NOTE — ED Notes (Addendum)
Called Dr. Frederic Jericho Mann's (GI)  Office at [phone] and spoke to [facility] who will page on-call Dr to call Dr Stark Jock

## 2019-11-15 DIAGNOSIS — K922 Gastrointestinal hemorrhage, unspecified: Secondary | ICD-10-CM

## 2019-11-15 DIAGNOSIS — Z940 Kidney transplant status: Secondary | ICD-10-CM | POA: Diagnosis not present

## 2019-11-15 DIAGNOSIS — I10 Essential (primary) hypertension: Secondary | ICD-10-CM

## 2019-11-15 DIAGNOSIS — D649 Anemia, unspecified: Secondary | ICD-10-CM | POA: Diagnosis not present

## 2019-11-15 DIAGNOSIS — Z86718 Personal history of other venous thrombosis and embolism: Secondary | ICD-10-CM

## 2019-11-15 DIAGNOSIS — D849 Immunodeficiency, unspecified: Secondary | ICD-10-CM | POA: Diagnosis not present

## 2019-11-15 DIAGNOSIS — I480 Paroxysmal atrial fibrillation: Secondary | ICD-10-CM

## 2019-11-15 LAB — COMPREHENSIVE METABOLIC PANEL
ALT: 12 U/L (ref 0–44)
ALT: 13 U/L (ref 0–44)
AST: 13 U/L — ABNORMAL LOW (ref 15–41)
AST: 14 U/L — ABNORMAL LOW (ref 15–41)
Albumin: 3.2 g/dL — ABNORMAL LOW (ref 3.5–5.0)
Albumin: 3.2 g/dL — ABNORMAL LOW (ref 3.5–5.0)
Alkaline Phosphatase: 96 U/L (ref 38–126)
Alkaline Phosphatase: 97 U/L (ref 38–126)
Anion gap: 8 (ref 5–15)
Anion gap: 8 (ref 5–15)
BUN: 42 mg/dL — ABNORMAL HIGH (ref 8–23)
BUN: 43 mg/dL — ABNORMAL HIGH (ref 8–23)
CO2: 29 mmol/L (ref 22–32)
CO2: 29 mmol/L (ref 22–32)
Calcium: 9.3 mg/dL (ref 8.9–10.3)
Calcium: 9.3 mg/dL (ref 8.9–10.3)
Chloride: 105 mmol/L (ref 98–111)
Chloride: 105 mmol/L (ref 98–111)
Creatinine, Ser: 2.05 mg/dL — ABNORMAL HIGH (ref 0.44–1.00)
Creatinine, Ser: 2.07 mg/dL — ABNORMAL HIGH (ref 0.44–1.00)
GFR calc Af Amer: 27 mL/min — ABNORMAL LOW (ref >60)
GFR calc Af Amer: 28 mL/min — ABNORMAL LOW (ref >60)
GFR calc non Af Amer: 24 mL/min — ABNORMAL LOW (ref >60)
GFR calc non Af Amer: 24 mL/min — ABNORMAL LOW (ref >60)
Glucose, Bld: 123 mg/dL — ABNORMAL HIGH (ref 70–99)
Glucose, Bld: 127 mg/dL — ABNORMAL HIGH (ref 70–99)
Potassium: 4.5 mmol/L (ref 3.5–5.1)
Potassium: 4.6 mmol/L (ref 3.5–5.1)
Sodium: 142 mmol/L (ref 135–145)
Sodium: 142 mmol/L (ref 135–145)
Total Bilirubin: 1.1 mg/dL (ref 0.3–1.2)
Total Bilirubin: 1.5 mg/dL — ABNORMAL HIGH (ref 0.3–1.2)
Total Protein: 5.4 g/dL — ABNORMAL LOW (ref 6.5–8.1)
Total Protein: 5.5 g/dL — ABNORMAL LOW (ref 6.5–8.1)

## 2019-11-15 LAB — CBC
HCT: 28.2 % — ABNORMAL LOW (ref 36.0–46.0)
Hemoglobin: 8.8 g/dL — ABNORMAL LOW (ref 12.0–15.0)
MCH: 26.6 pg (ref 26.0–34.0)
MCHC: 31.2 g/dL (ref 30.0–36.0)
MCV: 85.2 fL (ref 80.0–100.0)
Platelets: 154 10*3/uL (ref 150–400)
RBC: 3.31 MIL/uL — ABNORMAL LOW (ref 3.87–5.11)
RDW: 19 % — ABNORMAL HIGH (ref 11.5–15.5)
WBC: 11.2 10*3/uL — ABNORMAL HIGH (ref 4.0–10.5)
nRBC: 0 % (ref 0.0–0.2)

## 2019-11-15 LAB — PHOSPHORUS: Phosphorus: 3.1 mg/dL (ref 2.5–4.6)

## 2019-11-15 LAB — APTT: aPTT: 33 seconds (ref 24–36)

## 2019-11-15 LAB — HEMOGLOBIN AND HEMATOCRIT, BLOOD
HCT: 23.4 % — ABNORMAL LOW (ref 36.0–46.0)
HCT: 23 % — ABNORMAL LOW (ref 36.0–46.0)
HCT: 24.9 % — ABNORMAL LOW (ref 36.0–46.0)
HCT: 25.7 % — ABNORMAL LOW (ref 36.0–46.0)
Hemoglobin: 7.4 g/dL — ABNORMAL LOW (ref 12.0–15.0)
Hemoglobin: 7.2 g/dL — ABNORMAL LOW (ref 12.0–15.0)
Hemoglobin: 8 g/dL — ABNORMAL LOW (ref 12.0–15.0)
Hemoglobin: 8.1 g/dL — ABNORMAL LOW (ref 12.0–15.0)

## 2019-11-15 LAB — PROTIME-INR
INR: 1.3 — ABNORMAL HIGH (ref 0.8–1.2)
Prothrombin Time: 15.4 seconds — ABNORMAL HIGH (ref 11.4–15.2)

## 2019-11-15 LAB — PREPARE RBC (CROSSMATCH)

## 2019-11-15 LAB — MAGNESIUM: Magnesium: 1.8 mg/dL (ref 1.7–2.4)

## 2019-11-15 MED ORDER — SODIUM CHLORIDE 0.9 % IV SOLN: INTRAVENOUS | Status: DC

## 2019-11-15 MED ORDER — LACTATED RINGERS IV BOLUS: 500.0000 mL | Freq: Once | INTRAVENOUS | Status: AC

## 2019-11-15 MED ORDER — PEG-KCL-NACL-NASULF-NA ASC-C 100 G PO SOLR
1.0000 | Freq: Once | ORAL | Status: AC
  Administered 2019-11-15: 200 g via ORAL
  Filled 2019-11-15: qty 1

## 2019-11-15 MED ORDER — CHLORHEXIDINE GLUCONATE CLOTH 2 % EX PADS
6.0000 | MEDICATED_PAD | Freq: Every day | CUTANEOUS | Status: DC
  Administered 2019-11-15: 6 via TOPICAL

## 2019-11-15 MED ORDER — SODIUM CHLORIDE 0.9% IV SOLUTION: Freq: Once | INTRAVENOUS | Status: AC

## 2019-11-15 NOTE — Progress Notes (Signed)
PROGRESS NOTE  [name] EGB:[id number] DOB: Jul 09, 1946 DOA: 11/14/2019 PCP: [name], NP   LOS: 1 day   Brief Narrative / Interim history: 74 year old female with history of ESRD status post renal transplant 2017 on chronic immunosuppression, A. fib status post ablation not on anticoagulation, cardiomyopathy, pacemaker, prior PE who came into the hospital with rectal bleeding.  She has been having several episodes of large-volume bright red blood per rectum.  She had no abdominal pain, no nausea or vomiting.  She tells me she has had an episode like this several months ago, was evaluated by Dr. Collene Mares with gastroenterology as an outpatient, underwent a colonoscopy earlier this year and as far as the patient knows it was unremarkable.  She is not sure whether she was diagnosed with diverticulosis.  She uses aspirin daily but not any NSAIDs or any other blood thinners.  Subjective / 24h Interval events: She is feeling well this morning, no lightheadedness, no wheezing, had a little bit of bleeding overnight but nowhere near to the bottom that she had at home.  Per nursing notes, she has been intermittently hypotensive but asymptomatic  Assessment & Plan: Principal Problem Lower GI bleed with acute blood loss anemia-patient's hemoglobin 7.4 this morning, continues to drop compared to last night.  Her bleeding seems to have clinically improved.  This sounds like a diverticular bleed given sudden onset and large volume -Transfuse unit of packed red blood cells -GI consulted -Continue PPI  Active Problems Renal transplant/immunosuppression -her baseline creatinine is around 2.0-2.3, around chronic kidney disease stage IV range.  She is making urine, continue immunosuppressants.  Avoid nephrotoxins.  Had renal transplant in 2017  Atrial fibrillation status post watchman procedure-not on anticoagulation, monitor  Essential hypertension -Slightly hypotensive overnight but asymptomatic,  she is on furosemide which I will continue  History of PE-patient used to be on Coumadin, complicated by intracerebral hemorrhage in 3710  Chronic diastolic CHF-most recent 2D echo 3 weeks ago with EF 55-60%  Pharmacy still to reconcile home meds  Scheduled Meds: . DULoxetine  30 mg Oral Daily  . ferrous sulfate  325 mg Oral Daily  . fluticasone furoate-vilanterol  1 puff Inhalation Daily  . furosemide  80 mg Oral BID  . magnesium oxide  800 mg Oral BID  . mycophenolate  180 mg Oral BID  . nystatin  5 mL Oral QID  . predniSONE  5 mg Oral Q breakfast  . tacrolimus  2 mg Oral BID  . vitamin C  250 mg Oral Daily   Continuous Infusions: . lactated ringers 75 mL/hr at 11/15/19 0625  . pantoprazole (PROTONIX) IV 80 mg (11/15/19 0022)   PRN Meds:.acetaminophen, albuterol, benzonatate, docusate sodium, ondansetron **OR** ondansetron (ZOFRAN) IV  DVT prophylaxis: SCDs Code Status: Full code Family Communication: Discussed with patient  Status is: Inpatient  Remains inpatient appropriate because:Inpatient level of care appropriate due to severity of illness   Dispo: The patient is from: Home              Anticipated d/c is to: Home              Anticipated d/c date is: 2 days              Patient currently is not medically stable to d/c.  Consultants:  GI  Procedures:  None   Microbiology  None   Antimicrobials: None     Objective: Vitals:   11/15/19 0400 11/15/19 0500 11/15/19 0600 11/15/19 0800  BP: (!) 91/53 (!) 108/58 111/60 108/62  Pulse: 83 70 70 70  Resp: 17 (!) 24 (!) 23 18  Temp:   98 F (36.7 C) 97.8 F (36.6 C)  TempSrc:   Oral Oral  SpO2: 97% 96% 96% 97%  Weight:      Height:        Intake/Output Summary (Last 24 hours) at 11/15/2019 0837 Last data filed at 11/15/2019 0324 Gross per 24 hour  Intake 967.34 ml  Output --  Net 967.34 ml   Filed Weights   11/14/19 1448  Weight: 63 kg    Examination:  Constitutional: NAD Eyes: no scleral  icterus ENMT: Mucous membranes are moist.  Neck: normal, supple Respiratory: clear to auscultation bilaterally, no wheezing, no crackles.  Cardiovascular: Regular rate and rhythm, no murmurs / rubs / gallops. No LE edema.  Abdomen: non distended, no tenderness. Bowel sounds positive.  Musculoskeletal: no clubbing / cyanosis.  Skin: no rashes Neurologic: non focal   Data Reviewed: I have independently reviewed following labs and imaging studies   CBC: Recent Labs  Lab 11/14/19 1553 11/14/19 1608 11/14/19 2310 11/15/19 0028 11/15/19 0515  WBC 10.1  --  10.9* 11.2*  --   NEUTROABS 8.5*  --   --   --   --   HGB 9.8* 10.2* 8.9* 8.8* 7.4*  HCT 31.7* 30.0* 28.8* 28.2* 23.4*  MCV 84.8  --  85.2 85.2  --   PLT 148*  --  160 154  --    Basic Metabolic Panel: Recent Labs  Lab 11/14/19 1608 11/14/19 2310 11/15/19 0028  NA 138 142 142  K 4.0 4.6 4.5  CL 103 105 105  CO2  --  29 29  GLUCOSE 111* 127* 123*  BUN 46* 42* 43*  CREATININE 2.30* 2.05* 2.07*  CALCIUM  --  9.3 9.3  MG  --  1.8  --   PHOS  --  3.1  --    Liver Function Tests: Recent Labs  Lab 11/14/19 2310 11/15/19 0028  AST 14* 13*  ALT 13 12  ALKPHOS 96 97  BILITOT 1.1 1.5*  PROT 5.4* 5.5*  ALBUMIN 3.2* 3.2*   Coagulation Profile: Recent Labs  Lab 11/15/19 0028  INR 1.3*   HbA1C: No results for input(s): HGBA1C in the last 72 hours. CBG: No results for input(s): GLUCAP in the last 168 hours.  Recent Results (from the past 240 hour(s))  Respiratory Panel by RT PCR (Flu A&B, Covid) - Nasopharyngeal Swab     Status: None   Collection Time: 11/14/19  5:27 PM   Specimen: Nasopharyngeal Swab  Result Value Ref Range Status   SARS Coronavirus 2 by RT PCR NEGATIVE NEGATIVE Final    Comment: (NOTE) SARS-CoV-2 target nucleic acids are NOT DETECTED. The SARS-CoV-2 RNA is generally detectable in upper respiratoy specimens during the acute phase of infection. The lowest concentration of SARS-CoV-2 viral  copies this assay can detect is 131 copies/mL. A negative result does not preclude SARS-Cov-2 infection and should not be used as the sole basis for treatment or other patient management decisions. A negative result may occur with  improper specimen collection/handling, submission of specimen other than nasopharyngeal swab, presence of viral mutation(s) within the areas targeted by this assay, and inadequate number of viral copies (<131 copies/mL). A negative result must be combined with clinical observations, patient history, and epidemiological information. The expected result is Negative. Fact Sheet for Patients:  PinkCheek.be Fact Sheet for Healthcare Providers:  GravelBags.it This test is not yet ap proved or cleared by the [place] and  has been authorized for detection and/or diagnosis of SARS-CoV-2 by FDA under an Emergency Use Authorization (EUA). This EUA will remain  in effect (meaning this test can be used) for the duration of the COVID-19 declaration under Section 564(b)(1) of the Act, 21 U.S.C. section 360bbb-3(b)(1), unless the authorization is terminated or revoked sooner.    Influenza A by PCR NEGATIVE NEGATIVE Final   Influenza B by PCR NEGATIVE NEGATIVE Final    Comment: (NOTE) The Xpert Xpress SARS-CoV-2/FLU/RSV assay is intended as an aid in  the diagnosis of influenza from Nasopharyngeal swab specimens and  should not be used as a sole basis for treatment. Nasal washings and  aspirates are unacceptable for Xpert Xpress SARS-CoV-2/FLU/RSV  testing. Fact Sheet for Patients: PinkCheek.be Fact Sheet for Healthcare Providers: GravelBags.it This test is not yet approved or cleared by the [place] FDA and  has been authorized for detection and/or diagnosis of SARS-CoV-2 by  FDA under an Emergency Use Authorization (EUA). This EUA will  remain  in effect (meaning this test can be used) for the duration of the  Covid-19 declaration under Section 564(b)(1) of the Act, 21  U.S.C. section 360bbb-3(b)(1), unless the authorization is  terminated or revoked. Performed at [facility], [address], [place], [place] [address]      Radiology Studies: No results found.  Marzetta Board, MD, PhD [facility]  Between 7 am - 7 pm I am available, please contact me via Amion or Securechat  Between 7 pm - 7 am I am not available, please contact night coverage MD/APP via Amion

## 2019-11-15 NOTE — Consult Note (Signed)
Referring Provider:  ? Primary Care Physician:  [name], NP Primary Gastroenterologist:  Dr. Collene Mares  Reason for Consultation:  GI bleed  HPI: [name] is a 74 y.o. female with multiple medical problems as listed below.  She presented to [facility] hospital for complaint of rectal bleeding.  Patient woke up yesterday with the urge to move her bowels and when she stood up she passed a large about of red blood.  She went to the bathroom and passed some more and then she went and laid back down in her bed.  Woke up again with the same type of episode at which time her husband convinced her to present to the ED.  Not on any blood thinners.  Says that she had some rectal bleeding back in the fall but not to this degree by any means.  She had a colonoscopy 12 years ago and reports doing a Cologuard study recently that was negative.  She reports poor appetite because things just do not taste good.  Reports almost 200 pound weight loss over the past 4 years or so since her renal transplant.  Says that she does not eat much at all.  No abdominal pain.  Hgb baseline 10-11 grams.  Down to 7.2 grams this AM so going to receive one unit PRBC's.  Had a bloody BM overnight and just a small amount this AM.   Past Medical History:  Diagnosis Date  . Acute diastolic (congestive) heart failure ([place]) 08/12/2017  . AKI (acute kidney injury) ([place]) 11/04/2017  . Anemia   . Anxiety   . Arthritis    "shoulders; arms; hips" (02/04/2018)  . Asthma   . Atrial fibrillation ([place])   . Atrial flutter [facility])    s/p aflutter ablation at [facility]  . Benign hypertension with ESRD (end-stage renal disease) ([place])   . Blood transfusion    never had a reaction to blood transfusion  . Cardiomyopathy Mar 2013   Mild, EF 50-55% by Mar 2013 ECHO, diast dysfxn II  . Cardiomyopathy ([place])   . CHF (congestive heart failure) ([place]) 2005  . CHF (congestive heart failure) ([place])   . Childhood asthma   . Closed fracture of right distal  radius 10/18/2016  . CMV (cytomegalovirus infection) ([place]) 10/03/2015  . Constipation    takes Miralax daily  . Constipation   . Depression    takes Zoloft daily  . Distal radius fracture, right   . DVT of lower extremity, bilateral ([place]) 12/21/11   "they're there now; been there for 2 wks"  . Eczema   . ESRD (end stage renal disease) on dialysis [facility]) 09/2011   07-19-2015 had Kidney transplant at [facility]; "don't get dialysis anymore" (02/04/2018)  . Fractures, stress    in both feet--6 OR 7 YRS AGO--RESOLVED  . Generalized edema [id number]  . Gout    doesn't require meds   . HCAP (healthcare-associated pneumonia) 09/10/2011  . Hearing difficulty   . High cholesterol   . History of CVA (cerebrovascular accident) without residual deficits   . History of hip replacement, total, right   . History of pneumonia   . Hx of Clostridium difficile infection   . Hypercalcemia    09/10/11  . Hyperparathyroidism ([place]) 04/07/2013  . Hypertension    takes Diltiazem daily   . Hypomagnesemia 08/02/2015  . Hypophosphatemia 11/08/2017  . ICB (intracranial bleed) ([place])   . Immunosuppression ([place]) 07/25/2015  . Memory changes   . Morbid obesity ([place])   .  Nonischemic dilated cardiomyopathy ([place])   . Obesity 04/07/2013  . Oligouria   . OSA on CPAP   . PAF (paroxysmal atrial fibrillation) ([facility])     HX OF CEREBRAL BLEED WHILE ON COUMADIN-SO PT NOT ON ANY BLOOD THINNERS NOW  . Peripheral vascular disease ([place])   . Presence of Watchman left atrial appendage closure device 10/04/2017  . Proteinuria 09/04/2016  . Psoriasis 08/07/2016  . 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 S/P insertion of IVC (inferior vena caval) filter   . Sepsis ([place]) 11/08/2017  . Stress fracture    bilateral feet  . Stroke [facility]) 2009   denies residual on 02/04/2018;  hemorrhagic now off coumadin  . Stroke due to intracerebral hemorrhage ([place]) 2009  . Suture reaction 09/04/2016  . Transfusion history   . Use of cane as  ambulatory aid     Past Surgical History:  Procedure Laterality Date  . AV FISTULA PLACEMENT  09/28/2011   Procedure: ARTERIOVENOUS (AV) FISTULA CREATION;  Surgeon: Rosetta Posner, MD;  Location: [place];  Service: Vascular;  Laterality: Left;  . CARDIAC CATHETERIZATION    . CARDIAC VALVE SURGERY  1960    FOR ATRIAL SEPTAL DEFECT  . CHOLECYSTECTOMY     1980's  . COLONOSCOPY    . CYSTOSCOPY     many yrs ago  . FEMUR FRACTURE SURGERY Right 03/2011; 09/03/2011   "had 2, 2 wk apart in 2012; broke it again 08/2011 & had OR"  . FRACTURE SURGERY    . I & D EXTREMITY  09/15/2011   Procedure: IRRIGATION AND DEBRIDEMENT EXTREMITY w REMOVAL OF HARDWARE;  Surgeon: [name], MD;  Location: [place];  Service: Orthopedics;  Laterality: Right;  . INCISION AND DRAINAGE HIP  12/21/2011   Procedure: IRRIGATION AND DEBRIDEMENT HIP;  Surgeon: [name], MD;  Location: [place];  Service: Orthopedics;  Laterality: Right;  I&D RIGHT HIP WITH PLACEMENT ANTIBIOTIC SPACER  . INSERTION OF DIALYSIS CATHETER     Procedure: INSERTION OF DIALYSIS CATHETER;  Surgeon: Rosetta Posner, MD;  Location: [place];  Service: Vascular;  Laterality: Right;  . IRRIGATION AND DEBRIDEMENT ABSCESS  12/21/11   right hip  . JOINT REPLACEMENT    . KIDNEY TRANSPLANT  07/19/15  . LAPAROSCOPIC GASTRIC BANDING  2008  . LEFT ATRIAL APPENDAGE OCCLUSION  10/04/2017  . OPEN REDUCTION INTERNAL FIXATION (ORIF) DISTAL RADIAL FRACTURE Right 10/18/2016   Procedure: OPEN REDUCTION INTERNAL FIXATION (ORIF) RIGHT DISTAL RADIAL FRACTURE;  Surgeon: Marchia Bond, MD;  Location: [place];  Service: Orthopedics;  Laterality: Right;  . TOTAL HIP ARTHROPLASTY Right 02/2011   right THA 02/2011, I&D/removal of hardware 09/2011,, repeat I&D Jun 2013, reimplantation R THA 03-26-2012  . TOTAL HIP REVISION  03/25/2012   Procedure: TOTAL HIP REVISION;  Surgeon: [name], MD;  Location: [facility];  Service: Orthopedics;  Laterality: Right;  Right Total Hip  Reimplantation  . TUBAL LIGATION    . VENA CAVA FILTER PLACEMENT  11/2011    Prior to Admission medications   Medication Sig Start Date End Date Taking? Authorizing Provider  acetaminophen (TYLENOL) 500 MG tablet Take 500 mg by mouth every 6 (six) hours as needed for mild pain or headache.    Yes [provider]  albuterol (PROVENTIL) (2.5 MG/3ML) 0.083% nebulizer solution Take 3 mLs (2.5 mg total) by nebulization every 6 (six) hours as needed for wheezing or shortness of breath. Dx:J45.909 10/25/19  Yes Rai, Vernelle Emerald, MD  aspirin EC 81 MG tablet Take 81 mg by mouth daily.  07/21/15  Yes [provider]  benzonatate (TESSALON) 100 MG capsule Take 1 capsule (100 mg total) by mouth 3 (three) times daily as needed for cough. 10/25/19  Yes Rai, Ripudeep K, MD  carvedilol (COREG) 12.5 MG tablet Take 1 tablet (12.5 mg total) by mouth 2 (two) times daily. Patient taking differently: Take 25 mg by mouth 2 (two) times daily.  10/25/19  Yes Rai, Ripudeep K, MD  docusate sodium (COLACE) 100 MG capsule Take 100 mg by mouth daily as needed (For constipation.).    Yes [provider]  DULoxetine (CYMBALTA) 30 MG capsule Take 1 capsule (30 mg total) by mouth daily. 10/28/19  Yes [name], [name], NP  Ferrous Sulfate (IRON PO) Take 325 mg by mouth daily.    Yes [provider]  fluticasone furoate-vilanterol (BREO ELLIPTA) 100-25 MCG/INH AEPB Inhale 1 puff into the lungs daily. 09/23/19  Yes [name], MD  furosemide (LASIX) 80 MG tablet Take 1 tablet (80 mg total) by mouth 2 (two) times daily. 10/25/19  Yes Rai, Ripudeep K, MD  magnesium oxide (MAG-OX) 400 MG tablet Take 2 tablets by mouth 2 (two) times daily. 10/18/19  Yes [provider]  mycophenolate (MYFORTIC) 180 MG EC tablet Take 180 mg by mouth 2 (two) times daily.   Yes [provider]  nystatin (MYCOSTATIN) 100000 UNIT/ML suspension Take 5 mLs (500,000 Units total) by mouth 4 (four) times daily.  10/28/19  Yes [name], [name], NP  potassium chloride SA (K-DUR,KLOR-CON) 20 MEQ tablet Take 20 mEq by mouth daily.    Yes [provider]  predniSONE (DELTASONE) 5 MG tablet Take 1 tablet (5 mg total) by mouth daily with breakfast. Please resume after you have completed prednisone taper 10/25/19  Yes Rai, Ripudeep K, MD  tacrolimus (PROGRAF) 1 MG capsule Take 2 mg by mouth 2 (two) times daily.    Yes [provider]  HYDROcodone-homatropine (HYCODAN) 5-1.5 MG/5ML syrup Take 5 mLs by mouth every 8 (eight) hours as needed for cough. Patient not taking: Reported on 11/04/2019 10/25/19   Mendel Corning, MD    Current Facility-Administered Medications  Medication Dose Route Frequency Provider Last Rate Last Admin  . 0.9 %  sodium chloride infusion (Manually program via Guardrails IV Fluids)   Intravenous Once Caren Griffins, MD      . acetaminophen (TYLENOL) tablet 500 mg  500 mg Oral Q6H PRN Arlan Organ, DO      . albuterol (PROVENTIL) (2.5 MG/3ML) 0.083% nebulizer solution 2.5 mg  2.5 mg Nebulization Q6H PRN Arlan Organ, DO      . benzonatate (TESSALON) capsule 100 mg  100 mg Oral TID PRN Arlan Organ, DO      . Chlorhexidine Gluconate Cloth 2 % PADS 6 each  6 each Topical Daily Gherghe, Costin M, MD      . docusate sodium (COLACE) capsule 100 mg  100 mg Oral Daily PRN Arlan Organ, DO      . DULoxetine (CYMBALTA) DR capsule 30 mg  30 mg Oral Daily Arlan Organ, DO      . ferrous sulfate tablet 325 mg  325 mg Oral Daily Bell, Thomas N, DO      . fluticasone furoate-vilanterol (BREO ELLIPTA) 100-25 MCG/INH 1 puff  1 puff Inhalation Daily Bell, Thomas N, DO      . furosemide (LASIX) tablet 80 mg  80  mg Oral BID Arlan Organ, DO      . lactated ringers infusion   Intravenous Continuous Arlan Organ, DO 75 mL/hr at 11/15/19 4650 New Bag at 11/15/19 3546  . magnesium oxide (MAG-OX) tablet 800 mg  800 mg Oral BID Arlan Organ, DO   800 mg at 11/14/19 2237  .  mycophenolate (MYFORTIC) EC tablet 180 mg  180 mg Oral BID Arlan Organ, DO   180 mg at 11/14/19 2237  . nystatin (MYCOSTATIN) 100000 UNIT/ML suspension 500,000 Units  5 mL Oral QID Arlan Organ, DO   500,000 Units at 11/14/19 2237  . ondansetron (ZOFRAN) tablet 4 mg  4 mg Oral Q6H PRN Arlan Organ, DO       Or  . ondansetron (ZOFRAN) injection 4 mg  4 mg Intravenous Q6H PRN Arlan Organ, DO      . pantoprazole (PROTONIX) 80 mg in sodium chloride 0.9 % 100 mL IVPB  80 mg Intravenous Q12H Arlan Organ, DO 180 mL/hr at 11/15/19 0022 80 mg at 11/15/19 0022  . predniSONE (DELTASONE) tablet 5 mg  5 mg Oral Q breakfast Arlan Organ, DO      . tacrolimus (PROGRAF) capsule 2 mg  2 mg Oral BID Arlan Organ, DO   2 mg at 11/14/19 2237  . vitamin C (ASCORBIC ACID) tablet 250 mg  250 mg Oral Daily Arlan Organ, DO   250 mg at 11/14/19 2237    Allergies as of 11/14/2019 - Review Complete 11/14/2019  Allergen Reaction Noted  . Ace inhibitors Other (See Comments) 03/28/2012  . Amiodarone Other (See Comments) 12/25/2017  . Warfarin Other (See Comments) 04/07/2013  . Warfarin sodium Other (See Comments) 06/06/2011    Family History  Problem Relation Age of Onset  . Cancer Father   . Diabetes Mother   . Hypertension Mother   . Arthritis Mother   . Hodgkin's lymphoma Other 32       dscd---HODGKINS DISEASE  . Hypertension Brother     Social History   Socioeconomic History  . Marital status: Married    Spouse name: Not on file  . Number of children: Not on file  . Years of education: Not on file  . Highest education level: Not on file  Occupational History  . Occupation: retired  Tobacco Use  . Smoking status: Never Smoker  . Smokeless tobacco: Never Used  Substance and Sexual Activity  . Alcohol use: Never  . Drug use: Never  . Sexual activity: Yes    Birth control/protection: Post-menopausal  Other Topics Concern  . Not on file  Social History Narrative   Married;  retired; lives in [place] Strain: High Risk  . Difficulty of Paying Living Expenses: Hard  Food Insecurity:   . Worried About Charity fundraiser in the Last Year:   . Arboriculturist in the Last Year:   Transportation Needs: No Transportation Needs  . Lack of Transportation (Medical): No  . Lack of Transportation (Non-Medical): No  Physical Activity:   . Days of Exercise per Week:   . Minutes of Exercise per Session:   Stress:   . Feeling of Stress :   Social Connections:   . Frequency of Communication with Friends and Family:   . Frequency of Social Gatherings with Friends and Family:   . Attends Religious Services:   . Active Member of  Clubs or Organizations:   . Attends Archivist Meetings:   Marland Kitchen Marital Status:   Intimate Partner Violence:   . Fear of Current or Ex-Partner:   . Emotionally Abused:   Marland Kitchen Physically Abused:   . Sexually Abused:     Review of Systems: ROS is O/W negative except as mentioned in HPI.  Physical Exam: Vital signs in last 24 hours: Temp:  [97.8 F (36.6 C)-98.4 F (36.9 C)] 97.8 F (36.6 C) (05/02 0800) Pulse Rate:  [70-83] 70 (05/02 0800) Resp:  [15-26] 18 (05/02 0800) BP: (65-127)/(36-76) 108/62 (05/02 0800) SpO2:  [91 %-100 %] 97 % (05/02 0800) Weight:  [61 kg] 63 kg (05/01 1448) Last BM Date: 11/14/19 General:  Alert, thin, pleasant and cooperative in NAD Head:  Normocephalic and atraumatic. Eyes:  Sclera clear, no icterus.  Conjunctiva pink. Ears:  Normal auditory acuity. Mouth:  No deformity or lesions.   Lungs:  Clear throughout to auscultation.  No wheezes, crackles, or rhonchi.  Heart:  Regular rate and rhythm; no murmurs, clicks, rubs, or gallops. Abdomen:  Soft, non-distended.  BS present.  Non-tender.  Msk:  Symmetrical without gross deformities. . Pulses:  Normal pulses noted. Extremities:  Without clubbing or edema. Neurologic:  Alert and oriented x 4;   grossly normal neurologically. Skin:  Intact without significant lesions or rashes. Psych:  Alert and cooperative. Normal mood and affect.  Intake/Output from previous day: 05/01 0701 - 05/02 0700 In: 967.3 [P.O.:200; I.V.:674.2; IV Piggyback:93.1] Out: -   Lab Results: Recent Labs    11/14/19 1553 11/14/19 1608 11/14/19 2310 11/15/19 0028 11/15/19 0515  WBC 10.1  --  10.9* 11.2*  --   HGB 9.8*   < > 8.9* 8.8* 7.4*  HCT 31.7*   < > 28.8* 28.2* 23.4*  PLT 148*  --  160 154  --    < > = values in this interval not displayed.   BMET Recent Labs    11/14/19 1608 11/14/19 2310 11/15/19 0028  NA 138 142 142  K 4.0 4.6 4.5  CL 103 105 105  CO2  --  29 29  GLUCOSE 111* 127* 123*  BUN 46* 42* 43*  CREATININE 2.30* 2.05* 2.07*  CALCIUM  --  9.3 9.3   LFT Recent Labs    11/15/19 0028  PROT 5.5*  ALBUMIN 3.2*  AST 13*  ALT 12  ALKPHOS 97  BILITOT 1.5*   PT/INR Recent Labs    11/15/19 0028  LABPROT 15.4*  INR 1.3*   IMPRESSION:  *LGIB:  Sounds possibly diverticular, but no colonoscopy in over 10 years.  Malignancy not excluded (but she does report a negative Cologuard earlier this year). *ABLA:  Hgb baseline 10-11 grams.  Down to 7.2 grams this AM so going to receive one unit PRBC's. *Weight loss, poor appetite:  Reports almost 200 pound weight loss over the past 4 years or so. *Renal transplant/chronic immunosuppression:  Transplant 2017. *Atrial fibrillation s/p Watchman procedure-not on anticoagulation  PLAN: -Will allow for clear liquids today then NPO after midnight. -EGD and colonoscopy with Dr. Collene Mares on 5/3 who will assume her care. -Monitor Hgb and transfuse prn.   Laban Emperor. Merrick Feutz  11/15/2019, 9:37 AM

## 2019-11-15 NOTE — Plan of Care (Signed)
I was paged due to patient getting hypotensive when getting up for orthostatic vitals. Upon seeing patient, she is alert and in no apparent distress and reports no symptoms while lying flat, but admits to lightheadedness upon sitting up. She had received her carvedilol this evening around 1030 pm, prior to that had normal BP. Will give her a 500 ml IVF bolus now and discontinue her carvedilol for now. Will re assess BP after bolus. Of note, hgb did decrease from OSH however appears to be stabilizing around 8.8. Repeat hgb at 5 am and continue Q6H.

## 2019-11-15 NOTE — Progress Notes (Addendum)
   11/15/19 0045  Vitals  BP (!) 65/36  MAP (mmHg) (!) 46  Pulse Rate 72  ECG Heart Rate 72  Resp 20  Oxygen Therapy  SpO2 95 %  O2 Device Room Air  MEWS Score  MEWS Temp 0  MEWS Systolic 3  MEWS Pulse 0  MEWS RR 0  MEWS LOC 0  MEWS Score 3  MEWS Score Color Yellow  Patient had a large rectal bleed with big clots of blood, while attempting to get orthostatic vitals, patient c/o dizziness, patient assisted back to bed. Patient denied chest pain, patient now resting quietly in bed no  sign of distress noted, MD notified, 500cc of lactated ringers bolus given  will continue to monitor.

## 2019-11-15 NOTE — Progress Notes (Signed)
Bowel movement med in size, Liquid dark red blood

## 2019-11-16 ENCOUNTER — Encounter (HOSPITAL_COMMUNITY): Payer: Self-pay | Admitting: Internal Medicine

## 2019-11-16 ENCOUNTER — Ambulatory Visit: Payer: Medicare Other | Admitting: Psychology

## 2019-11-16 ENCOUNTER — Inpatient Hospital Stay (HOSPITAL_COMMUNITY): Payer: Medicare Other | Admitting: Anesthesiology

## 2019-11-16 ENCOUNTER — Encounter (HOSPITAL_COMMUNITY)
Admission: EM | Disposition: A | Discharge status: Home Health | Payer: Self-pay | Source: Home / Self Care | Attending: Internal Medicine

## 2019-11-16 DIAGNOSIS — D649 Anemia, unspecified: Secondary | ICD-10-CM | POA: Diagnosis not present

## 2019-11-16 DIAGNOSIS — K922 Gastrointestinal hemorrhage, unspecified: Secondary | ICD-10-CM | POA: Diagnosis not present

## 2019-11-16 DIAGNOSIS — D849 Immunodeficiency, unspecified: Secondary | ICD-10-CM | POA: Diagnosis not present

## 2019-11-16 DIAGNOSIS — I10 Essential (primary) hypertension: Secondary | ICD-10-CM | POA: Diagnosis not present

## 2019-11-16 HISTORY — PX: COLONOSCOPY WITH PROPOFOL: SHX5780

## 2019-11-16 HISTORY — PX: ESOPHAGOGASTRODUODENOSCOPY (EGD) WITH PROPOFOL: SHX5813

## 2019-11-16 LAB — MAGNESIUM: Magnesium: 1.8 mg/dL (ref 1.7–2.4)

## 2019-11-16 LAB — BASIC METABOLIC PANEL
Anion gap: 10 (ref 5–15)
BUN: 37 mg/dL — ABNORMAL HIGH (ref 8–23)
CO2: 26 mmol/L (ref 22–32)
Calcium: 9.1 mg/dL (ref 8.9–10.3)
Chloride: 106 mmol/L (ref 98–111)
Creatinine, Ser: 2.14 mg/dL — ABNORMAL HIGH (ref 0.44–1.00)
GFR calc Af Amer: 26 mL/min — ABNORMAL LOW (ref >60)
GFR calc non Af Amer: 23 mL/min — ABNORMAL LOW (ref >60)
Glucose, Bld: 83 mg/dL (ref 70–99)
Potassium: 3.6 mmol/L (ref 3.5–5.1)
Sodium: 142 mmol/L (ref 135–145)

## 2019-11-16 LAB — BPAM RBC
Blood Product Expiration Date: 202105292359
ISSUE DATE / TIME: 202105021129
Unit Type and Rh: 5100

## 2019-11-16 LAB — CBC
HCT: 24.6 % — ABNORMAL LOW (ref 36.0–46.0)
Hemoglobin: 7.7 g/dL — ABNORMAL LOW (ref 12.0–15.0)
MCH: 26.2 pg (ref 26.0–34.0)
MCHC: 31.3 g/dL (ref 30.0–36.0)
MCV: 83.7 fL (ref 80.0–100.0)
Platelets: 128 10*3/uL — ABNORMAL LOW (ref 150–400)
RBC: 2.94 MIL/uL — ABNORMAL LOW (ref 3.87–5.11)
RDW: 18.6 % — ABNORMAL HIGH (ref 11.5–15.5)
WBC: 8.5 10*3/uL (ref 4.0–10.5)
nRBC: 0 % (ref 0.0–0.2)

## 2019-11-16 LAB — TYPE AND SCREEN
ABO/RH(D): O POS
Antibody Screen: NEGATIVE
Unit division: 0

## 2019-11-16 LAB — HEMOGLOBIN AND HEMATOCRIT, BLOOD
HCT: 27.2 % — ABNORMAL LOW (ref 36.0–46.0)
HCT: 26.8 % — ABNORMAL LOW (ref 36.0–46.0)
Hemoglobin: 8.6 g/dL — ABNORMAL LOW (ref 12.0–15.0)
Hemoglobin: 8.7 g/dL — ABNORMAL LOW (ref 12.0–15.0)

## 2019-11-16 LAB — TACROLIMUS LEVEL: Tacrolimus (FK506) - LabCorp: 10 ng/mL (ref 2.0–20.0)

## 2019-11-16 SURGERY — ESOPHAGOGASTRODUODENOSCOPY (EGD) WITH PROPOFOL
Anesthesia: Monitor Anesthesia Care

## 2019-11-16 MED ORDER — PROPOFOL 500 MG/50ML IV EMUL
INTRAVENOUS | Status: DC | PRN
  Administered 2019-11-16: 100 ug/kg/min via INTRAVENOUS

## 2019-11-16 MED ORDER — ONDANSETRON HCL 4 MG/2ML IJ SOLN
INTRAMUSCULAR | Status: DC | PRN
  Administered 2019-11-16: 4 mg via INTRAVENOUS

## 2019-11-16 MED ORDER — FUROSEMIDE 80 MG PO TABS
80.0000 mg | ORAL_TABLET | Freq: Two times a day (BID) | ORAL | Status: DC
  Administered 2019-11-17 – 2019-11-18 (×3): 80 mg via ORAL
  Filled 2019-11-16 (×3): qty 1

## 2019-11-16 SURGICAL SUPPLY — 25 items

## 2019-11-16 NOTE — Progress Notes (Signed)
Subjective: 74 year old black female with a history of blood in stool and abnormal weight loss in the endoscopy unit for EGD and colonoscopy.  Objective: Vital signs in last 24 hours: Temp:  [97.2 F (36.2 C)-98.5 F (36.9 C)] 98.1 F (36.7 C) (05/03 1154) Pulse Rate:  [70-74] 74 (05/03 1147) Resp:  [15-23] 21 (05/03 1147) BP: (118-179)/(57-75) 179/74 (05/03 1154) SpO2:  [98 %-100 %] 99 % (05/03 1147) Weight:  [63 kg] 63 kg (05/03 1147) Last BM Date: 11/16/19(per NT)  Intake/Output from previous day: 05/02 0701 - 05/03 0700 In: 4787.2 [P.O.:1800; I.V.:1952.2; Blood:731; IV MBTDHRCBU:384] Out: -  Intake/Output this shift: No intake/output data recorded.  General appearance: alert, cooperative, appears stated age and no distress Resp: clear to auscultation bilaterally Cardio: regular rate and rhythm, S1, S2 normal, no murmur, click, rub or gallop GI: soft, non-tender; bowel sounds normal; no masses,  no organomegaly  Lab Results: Recent Labs    11/14/19 2310 11/14/19 2310 11/15/19 0028 11/15/19 0515 11/15/19 1613 11/15/19 2229 11/16/19 0325  WBC 10.9*  --  11.2*  --   --   --  8.5  HGB 8.9*   < > 8.8*   < > 8.0* 8.1* 7.7*  HCT 28.8*   < > 28.2*   < > 24.9* 25.7* 24.6*  PLT 160  --  154  --   --   --  128*   < > = values in this interval not displayed.   BMET Recent Labs    11/14/19 2310 11/15/19 0028 11/16/19 0325  NA 142 142 142  K 4.6 4.5 3.6  CL 105 105 106  CO2 29 29 26   GLUCOSE 127* 123* 83  BUN 42* 43* 37*  CREATININE 2.05* 2.07* 2.14*  CALCIUM 9.3 9.3 9.1   LFT Recent Labs    11/15/19 0028  PROT 5.5*  ALBUMIN 3.2*  AST 13*  ALT 12  ALKPHOS 97  BILITOT 1.5*   PT/INR Recent Labs    11/15/19 0028  LABPROT 15.4*  INR 1.3*   Medications: I have reviewed the patient's current medications.  Assessment/Plan: Rectal bleeding with iron deficiency anemia and abnormal weight loss-proceed with a EGD and colonoscopy at this time.  LOS: 2 days    Juanita Craver 11/16/2019, 12:21 PM

## 2019-11-16 NOTE — Progress Notes (Signed)
Pt back to rm 24 from endoscopy. VSS. Pt alert  Lavenia Atlas, RN

## 2019-11-16 NOTE — Op Note (Signed)
[facility] Patient Name: [name] Procedure Date : 11/16/2019 MRN: [id number] Attending MD: Juanita Craver , MD Date of Birth: [birth year] CSN: [id number] Age: 74 Admit Type: Inpatient Procedure:                Diagnostic colonoscopy. Indications:              Rectal bleeding, Iron deficiency anemia; Abnormal                            weight loss; CRC screening for colorectal malignant                            neoplasm. Providers:                Juanita Craver, MD, Benay Pillow, RN, Theodora Blow,                            Technician, Lelon Huh, CRNA Referring MD:             THP Medicines:                Monitored Anesthesia Care Complications:            No immediate complications. Estimated Blood Loss:     Estimated blood loss was minimal. Procedure:                Pre-Anesthesia Assessment: - Prior to the                            procedure, a history and physical was performed,                            and patient medications and allergies were                            reviewed. The patient's tolerance of previous                            anesthesia was also reviewed. The risks and                            benefits of the procedure and the sedation options                            and risks were discussed with the patient. All                            questions were answered, and informed consent was                            obtained. Prior Anticoagulants: The patient has                            taken no previous anticoagulant or antiplatelet  agents except for aspirin. ASA Grade Assessment:                            III - A patient with severe systemic disease. After                            reviewing the risks and benefits, the patient was                            deemed in satisfactory condition to undergo the                            procedure. After obtaining informed consent, the                             colonoscope was passed under direct vision.                            Throughout the procedure, the patient's blood                            pressure, pulse, and oxygen saturations were                            monitored continuously. The CF-HQ190L ([id number])                            Olympus colonoscope was introduced through the anus                            and advanced to the the cecum, identified by                            appendiceal orifice and ileocecal valve. The                            colonoscopy was performed with moderate difficulty                            due to inadequate bowel prep. Successful completion                            of the procedure was aided by lavage. The patient                            tolerated the procedure well. The quality of the                            bowel preparation was adequate. The appendiceal                            orifice and the rectum were photographed. The bowel  preparation used was GoLYTELY via split dose                            instruction. Scope In: 12:35:46 PM Scope Out: 12:46:23 PM Scope Withdrawal Time: 0 hours 0 minutes 52 seconds  Total Procedure Duration: 0 hours 10 minutes 37 seconds  Findings:      Multiple small and large-mouthed diverticula were found in the sigmoid       colon.      The exam was otherwise without abnormality on direct and retroflexion       views.      Some barotrauma was noted in the right colon. Impression:               - Diverticulosis in the sigmoid colon.                           - The examination was otherwise normal on direct                            and retroflexion views.                           - No specimens collected. Moderate Sedation:      MAC used. Recommendation:           - Clear liquid diet today.                           - Continue present medications.                           - Continue serial CBC's and TSH  levels. Procedure Code(s):        --- Professional ---                           [phone], Colonoscopy, flexible; diagnostic, including                            collection of specimen(s) by brushing or washing,                            when performed (separate procedure) Diagnosis Code(s):        --- Professional ---                           D50.9, Iron deficiency anemia, unspecified                           K92.1, Melena (includes Hematochezia)                           R63.4, Abnormal weight loss                           Z12.11, Encounter for screening for malignant                            neoplasm of colon CPT copyright  2019 American Medical Association. All rights reserved. The codes documented in this report are preliminary and upon coder review may  be revised to meet current compliance requirements. Juanita Craver, MD Juanita Craver, MD 11/16/2019 1:06:46 PM This report has been signed electronically. Number of Addenda: 0

## 2019-11-16 NOTE — Anesthesia Procedure Notes (Signed)
Procedure Name: MAC Date/Time: 11/16/2019 12:20 PM Performed by: Griffin Dakin, CRNA Pre-anesthesia Checklist: Patient identified, Emergency Drugs available and Suction available Patient Re-evaluated:Patient Re-evaluated prior to induction Oxygen Delivery Method: Nasal cannula Induction Type: IV induction Dental Injury: Teeth and Oropharynx as per pre-operative assessment

## 2019-11-16 NOTE — Progress Notes (Signed)
PROGRESS NOTE  [name] YPP:[id number] DOB: 1946/04/19 DOA: 11/14/2019 PCP: [name], NP   LOS: 2 days   Brief Narrative / Interim history: 74 year old female with history of ESRD status post renal transplant 2017 on chronic immunosuppression, A. fib status post ablation not on anticoagulation, cardiomyopathy, pacemaker, prior PE who came into the hospital with rectal bleeding.  She has been having several episodes of large-volume bright red blood per rectum.  She had no abdominal pain, no nausea or vomiting.  She tells me she has had an episode like this several months ago, was evaluated by Dr. Collene Mares with gastroenterology as an outpatient, underwent a colonoscopy earlier this year and as far as the patient knows it was unremarkable.  She is not sure whether she was diagnosed with diverticulosis.  She uses aspirin daily but not any NSAIDs or any other blood thinners.  Subjective / 24h Interval events: Had bowel prep overnight, still some blood in the stool.  No lightheadedness or dizziness.  No chest pain, no shortness of breath.  Assessment & Plan: Principal Problem Lower GI bleed with acute blood loss anemia-GI consulted, appreciate input.  Continue to monitor hemoglobin. -Patient is scheduled for a colonoscopy as well as an endoscopy today by Dr. Collene Mares  Active Problems Renal transplant/immunosuppression -her baseline creatinine is around 2.0-2.3, around chronic kidney disease stage IV range.  She is making urine, continue immunosuppressants.  Avoid nephrotoxins.  Had renal transplant in 2017 -Kidney function is stable today, creatinine 2.14  Atrial fibrillation status post watchman procedure-not on anticoagulation, monitor  Essential hypertension -blood pressure improved and she is no longer hypotensive.  Has some shortness of breath and I will discontinue IV fluids today  History of PE-patient used to be on Coumadin, complicated by intracerebral hemorrhage in  4580  Chronic diastolic CHF-most recent 2D echo 3 weeks ago with EF 55-60%   Scheduled Meds: . Chlorhexidine Gluconate Cloth  6 each Topical Daily  . DULoxetine  30 mg Oral Daily  . ferrous sulfate  325 mg Oral Daily  . fluticasone furoate-vilanterol  1 puff Inhalation Daily  . [START ON 11/17/2019] furosemide  80 mg Oral BID  . magnesium oxide  800 mg Oral BID  . mycophenolate  180 mg Oral BID  . nystatin  5 mL Oral QID  . predniSONE  5 mg Oral Q breakfast  . tacrolimus  2 mg Oral BID  . vitamin C  250 mg Oral Daily   Continuous Infusions: . sodium chloride    . lactated ringers 75 mL/hr at 11/15/19 2144  . pantoprazole (PROTONIX) IV 80 mg (11/15/19 2145)   PRN Meds:.acetaminophen, albuterol, benzonatate, docusate sodium, ondansetron **OR** ondansetron (ZOFRAN) IV  DVT prophylaxis: SCDs Code Status: Full code Family Communication: Discussed with patient  Status is: Inpatient  Remains inpatient appropriate because: Plan for endoscopy today, still bleeding  Dispo: The patient is from: Home              Anticipated d/c is to: Home              Anticipated d/c date is: 2 days              Patient currently is not medically stable to d/c.  Consultants:  GI  Procedures:  None   Microbiology  None   Antimicrobials: None     Objective: Vitals:   11/16/19 0026 11/16/19 0530 11/16/19 0819 11/16/19 0825  BP: 128/63 (!) 145/72 (!) 148/70   Pulse: 70  73 70   Resp: 20 (!) 21 (!) 23 20  Temp: 98.5 F (36.9 C) (!) 97.3 F (36.3 C) 98.1 F (36.7 C)   TempSrc: Oral Axillary Oral   SpO2: 98% 99% 98% 98%  Weight:      Height:        Intake/Output Summary (Last 24 hours) at 11/16/2019 0956 Last data filed at 11/16/2019 4656 Gross per 24 hour  Intake 4787.23 ml  Output --  Net 4787.23 ml   Filed Weights   11/14/19 1448  Weight: 63 kg    Examination:  Constitutional: No distress Eyes: No icterus ENMT: Moist mucous membranes Neck: normal, supple Respiratory:  Clear bilaterally without wheezing or crackles Cardiovascular: Regular rate and rhythm, no murmurs Abdomen: Soft, NT, ND, bowel sounds positive Musculoskeletal: no clubbing / cyanosis.  Skin: No rashes seen Neurologic: No focal deficits  Data Reviewed: I have independently reviewed following labs and imaging studies   CBC: Recent Labs  Lab 11/14/19 1553 11/14/19 1608 11/14/19 2310 11/14/19 2310 11/15/19 0028 11/15/19 0028 11/15/19 0515 11/15/19 1005 11/15/19 1613 11/15/19 2229 11/16/19 0325  WBC 10.1  --  10.9*  --  11.2*  --   --   --   --   --  8.5  NEUTROABS 8.5*  --   --   --   --   --   --   --   --   --   --   HGB 9.8*   < > 8.9*   < > 8.8*   < > 7.4* 7.2* 8.0* 8.1* 7.7*  HCT 31.7*   < > 28.8*   < > 28.2*   < > 23.4* 23.0* 24.9* 25.7* 24.6*  MCV 84.8  --  85.2  --  85.2  --   --   --   --   --  83.7  PLT 148*  --  160  --  154  --   --   --   --   --  128*   < > = values in this interval not displayed.   Basic Metabolic Panel: Recent Labs  Lab 11/14/19 1608 11/14/19 2310 11/15/19 0028 11/16/19 0325  NA 138 142 142 142  K 4.0 4.6 4.5 3.6  CL 103 105 105 106  CO2  --  29 29 26   GLUCOSE 111* 127* 123* 83  BUN 46* 42* 43* 37*  CREATININE 2.30* 2.05* 2.07* 2.14*  CALCIUM  --  9.3 9.3 9.1  MG  --  1.8  --  1.8  PHOS  --  3.1  --   --    Liver Function Tests: Recent Labs  Lab 11/14/19 2310 11/15/19 0028  AST 14* 13*  ALT 13 12  ALKPHOS 96 97  BILITOT 1.1 1.5*  PROT 5.4* 5.5*  ALBUMIN 3.2* 3.2*   Coagulation Profile: Recent Labs  Lab 11/15/19 0028  INR 1.3*   HbA1C: No results for input(s): HGBA1C in the last 72 hours. CBG: No results for input(s): GLUCAP in the last 168 hours.  Recent Results (from the past 240 hour(s))  Respiratory Panel by RT PCR (Flu A&B, Covid) - Nasopharyngeal Swab     Status: None   Collection Time: 11/14/19  5:27 PM   Specimen: Nasopharyngeal Swab  Result Value Ref Range Status   SARS Coronavirus 2 by RT PCR NEGATIVE  NEGATIVE Final    Comment: (NOTE) SARS-CoV-2 target nucleic acids are NOT DETECTED. The SARS-CoV-2 RNA is generally detectable in  upper respiratoy specimens during the acute phase of infection. The lowest concentration of SARS-CoV-2 viral copies this assay can detect is 131 copies/mL. A negative result does not preclude SARS-Cov-2 infection and should not be used as the sole basis for treatment or other patient management decisions. A negative result may occur with  improper specimen collection/handling, submission of specimen other than nasopharyngeal swab, presence of viral mutation(s) within the areas targeted by this assay, and inadequate number of viral copies (<131 copies/mL). A negative result must be combined with clinical observations, patient history, and epidemiological information. The expected result is Negative. Fact Sheet for Patients:  PinkCheek.be Fact Sheet for Healthcare Providers:  GravelBags.it This test is not yet ap proved or cleared by the [place] FDA and  has been authorized for detection and/or diagnosis of SARS-CoV-2 by FDA under an Emergency Use Authorization (EUA). This EUA will remain  in effect (meaning this test can be used) for the duration of the COVID-19 declaration under Section 564(b)(1) of the Act, 21 U.S.C. section 360bbb-3(b)(1), unless the authorization is terminated or revoked sooner.    Influenza A by PCR NEGATIVE NEGATIVE Final   Influenza B by PCR NEGATIVE NEGATIVE Final    Comment: (NOTE) The Xpert Xpress SARS-CoV-2/FLU/RSV assay is intended as an aid in  the diagnosis of influenza from Nasopharyngeal swab specimens and  should not be used as a sole basis for treatment. Nasal washings and  aspirates are unacceptable for Xpert Xpress SARS-CoV-2/FLU/RSV  testing. Fact Sheet for Patients: PinkCheek.be Fact Sheet for Healthcare  Providers: GravelBags.it This test is not yet approved or cleared by the [place] FDA and  has been authorized for detection and/or diagnosis of SARS-CoV-2 by  FDA under an Emergency Use Authorization (EUA). This EUA will remain  in effect (meaning this test can be used) for the duration of the  Covid-19 declaration under Section 564(b)(1) of the Act, 21  U.S.C. section 360bbb-3(b)(1), unless the authorization is  terminated or revoked. Performed at [facility], [address] [place], [place] [address]      Radiology Studies: No results found.  Marzetta Board, MD, PhD [facility]  Between 7 am - 7 pm I am available, please contact me via Amion or Securechat  Between 7 pm - 7 am I am not available, please contact night coverage MD/APP via Amion

## 2019-11-16 NOTE — Chronic Care Management (AMB) (Deleted)
Chronic Care Management Pharmacy  Name: [name]  MRN: [id number] DOB: 17-Apr-1946  Chief Complaint/ HPI  [name],  74 y.o. , female presents for their Initial CCM visit with the clinical pharmacist via telephone due to COVID-19 Pandemic.  PCP : [name], NP  Their chronic conditions include: Hypertension, atrial fibrillation, heart failure, asthma, COPD, and renal transplant   Office Visits: 10/28/19: [facility] presented to [facility] for hospital follow-up. Patient reported no change with mood symptoms. Sertraline discontinued, started on Duloxetine. Patient started on Nystatin for Candidal cheilitis. 10/22/19: Video visit with [facility] for shortness of breath. Patient with 4 weeks of cough, shortness of breath, and wheezing not relieved by inhaler use. Patient advised to present to ED. 05/29/19: Patient presented to [facility] for depression follow-up. PHQ9 score12, GAD7 score 8. Patient report no change in mood on sertraline, not interested in changing medications at this time.   Consult Visit: 10/22/19-10/25/19: Patient admitted to [facility] for COPD exacerbation and HF exacerbation. Patient presented with progressively worsening shortness of breath and cough. Patient placed on IV diuresis, IV solu-Medrol, IV antibiotics. Patient discharged on prednisone taper x 11 days.  07/21/19: Patient presented to Dr. Rosana Fret [facility]) for renal transplant follow-up.   Medications: Facility-Administered Encounter Medications as of 11/17/2019  Medication  . 0.9 %  sodium chloride infusion  . [MAR Hold] acetaminophen (TYLENOL) tablet 500 mg  . [MAR Hold] albuterol (PROVENTIL) (2.5 MG/3ML) 0.083% nebulizer solution 2.5 mg  . [MAR Hold] benzonatate (TESSALON) capsule 100 mg  . [MAR Hold] Chlorhexidine Gluconate Cloth 2 % PADS 6 each  . [MAR Hold] docusate sodium (COLACE) capsule 100 mg  . [MAR Hold] DULoxetine (CYMBALTA) DR  capsule 30 mg  . [MAR Hold] ferrous sulfate tablet 325 mg  . [MAR Hold] fluticasone furoate-vilanterol (BREO ELLIPTA) 100-25 MCG/INH 1 puff  . [MAR Hold] furosemide (LASIX) tablet 80 mg  . [MAR Hold] magnesium oxide (MAG-OX) tablet 800 mg  . [MAR Hold] mycophenolate (MYFORTIC) EC tablet 180 mg  . [MAR Hold] nystatin (MYCOSTATIN) 100000 UNIT/ML suspension 500,000 Units  . [MAR Hold] ondansetron (ZOFRAN) tablet 4 mg   Or  . [MAR Hold] ondansetron (ZOFRAN) injection 4 mg  . [MAR Hold] pantoprazole (PROTONIX) 80 mg in sodium chloride 0.9 % 100 mL IVPB  . [MAR Hold] predniSONE (DELTASONE) tablet 5 mg  . [MAR Hold] tacrolimus (PROGRAF) capsule 2 mg  . [MAR Hold] vitamin C (ASCORBIC ACID) tablet 250 mg   Outpatient Encounter Medications as of 11/17/2019  Medication Sig Note  . acetaminophen (TYLENOL) 500 MG tablet Take 500 mg by mouth every 6 (six) hours as needed for mild pain or headache.    . albuterol (PROVENTIL) (2.5 MG/3ML) 0.083% nebulizer solution Take 3 mLs (2.5 mg total) by nebulization every 6 (six) hours as needed for wheezing or shortness of breath. Dx:J45.909   . aspirin EC 81 MG tablet Take 81 mg by mouth daily.    . benzonatate (TESSALON) 100 MG capsule Take 1 capsule (100 mg total) by mouth 3 (three) times daily as needed for cough. 11/15/2019: Ran out.   . carvedilol (COREG) 12.5 MG tablet Take 1 tablet (12.5 mg total) by mouth 2 (two) times daily. (Patient taking differently: Take 25 mg by mouth 2 (two) times daily. ) 11/05/2019: States MD is aware.   . docusate sodium (COLACE) 100 MG capsule Take 100 mg by mouth daily as needed (For constipation.).    Marland Kitchen  DULoxetine (CYMBALTA) 30 MG capsule Take 1 capsule (30 mg total) by mouth daily.   . Ferrous Sulfate (IRON PO) Take 325 mg by mouth daily.    . fluticasone furoate-vilanterol (BREO ELLIPTA) 100-25 MCG/INH AEPB Inhale 1 puff into the lungs daily.   . furosemide (LASIX) 80 MG tablet Take 1 tablet (80 mg total) by mouth 2 (two) times  daily. 11/05/2019: States she is taking 2  80 mg tablets twice a day and will call kidney MD to verify.   Marland Kitchen HYDROcodone-homatropine (HYCODAN) 5-1.5 MG/5ML syrup Take 5 mLs by mouth every 8 (eight) hours as needed for cough. (Patient not taking: Reported on 11/04/2019)   . magnesium oxide (MAG-OX) 400 MG tablet Take 2 tablets by mouth 2 (two) times daily.   . mycophenolate (MYFORTIC) 180 MG EC tablet Take 180 mg by mouth 2 (two) times daily.   Marland Kitchen nystatin (MYCOSTATIN) 100000 UNIT/ML suspension Take 5 mLs (500,000 Units total) by mouth 4 (four) times daily.   . potassium chloride SA (K-DUR,KLOR-CON) 20 MEQ tablet Take 20 mEq by mouth daily.    . predniSONE (DELTASONE) 5 MG tablet Take 1 tablet (5 mg total) by mouth daily with breakfast. Please resume after you have completed prednisone taper   . tacrolimus (PROGRAF) 1 MG capsule Take 2 mg by mouth 2 (two) times daily.       Current Diagnosis/Assessment:  Goals Addressed   None     AFIB   Patient with history of [place] Pacemaker (04/18/18) History of brain hemorrhage with warfarin  Patient has failed these meds in past: n/a Patient is currently controlled on the following medications:   Carvedilol 12.5 mg BID   We discussed:  diet and exercise extensively  Plan  Continue current medications   COPD / Asthma    Managed by Dr. Kara Mead and Wyn Quaker, NP  12/02/2017-pulmonary function test-FVC 1.33 (53% duty), postbronchodilator ratio 79, postbronchodilator FEV1 1.00 (51% predicted), DLCO 11.25 (43% predicted)  Gold Grade: Gold 2 (FEV1 50-79%) Current COPD Classification:  D (high sx, >/=2 exacerbations/yr)  Eosinophil count:   Lab Results  Component Value Date/Time   EOSPCT 1 11/14/2019 03:53 PM  %                               Eos (Absolute):  Lab Results  Component Value Date/Time   EOSABS 0.1 11/14/2019 03:53 PM    Tobacco Status:  Social History   Tobacco Use  Smoking Status Never Smoker  Smokeless Tobacco  Never Used    Patient has failed these meds in past: Symbicort, Proventil HFA, QVAR, Flovent Patient is currently uncontrolled on the following medications:   Proventil 0.083% neb solution  Breo Ellipta 100-25 mcg/inh 1 puff daily   Benzonatate 100 mg TID PRN (using TID cough)   Using maintenance inhaler regularly? Yes Frequency of rescue inhaler use:  9 times in past week.   We discussed:  Patient reports challenges affording her inhalers. She was previously on Symbicort which she liked, but had to switch because her inhaler was costing her ($400). Of note this was near the beginning of the year and may be a result of her needing to meet her deductible. Both Symbicort and Breo are T3 drugs on her insurance formulary.   Plan Recommend patient follow-up with pulmonology Given patient's recent exacerbation, and frequent use of nebulizer/PRN cough medicinesstep-up therapy may be warranted. Recommend initiating Trelegy, which is  a similarly tiered medication on patient's formulary.    Heart Failure   Type: Diastolic  Last ejection fraction: 55-60% (10/25/19) NYHA Class: II (slight limitation of activity) AHA HF Stage: C (Heart disease and symptoms present)  Patient has failed these meds in past:  Patient is currently uncontrolled on the following medications:   Carvedilol 12.5 mg BID   Furosemide 80 mg BID   Weights over past week: 154 lbs (on discharge). Patient reports weight today was 154.4 lbs  We discussed: Patient reports she has been taking furosemide 160 mg twice daily for quite some time. Previous fills from Dr. Jeneen Rinks [place] were written for 160 mg BID, but patient discharged on most recent admission with furosemide 80 mg BID.  Plan Patient agreed to begin weighing her self daily and recording the results.  Continue current medications  and  Hypertension   BP today is:  <140/90  Office blood pressures are  BP Readings from Last 3 Encounters:  11/16/19 (!) 164/77    11/04/19 118/64  10/28/19 132/80    Patient has failed these meds in the past: n/a Patient is currently controlled on the following medications: n/a  Carvedilol 12.5 mg BID   Furosemide 80 mg BID (kidney)  Patient checks BP at home 1-2x per week  Patient home BP readings are ranging:  15-Apr 130/88  11-Apr 148/78  12-Mar 145/78  13-Mar 161/91   Plan  Continue current medications   Hyperlipidemia   History of hemorragic stroke   Lipid Panel  (05/12/15 in [place]): TC 132 Trig 111 HDL 51 LDL 55  The ASCVD Risk score (Goff DC Jr., et al., 2013) failed to calculate for the following reasons:   The patient has a prior MI or stroke diagnosis   Patient has failed these meds in past: n/a Patient is currently controlled on the following medications: none  We discussed:  diet and exercise extensively  Plan  Continue control with diet and exercise   Renal Transplant   Managed by Dr. Rosana Fret. 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 Otherwise patient reports tolerating well.   Plan  Continue current medications  Depression   Patient has failed these meds in past: Sertraline (ineffective)  Patient is currently uncontrolled on the following medications:   Duloxetine 30 mg daily   We discussed: Patient picked up new Rx of Duloxetine on 4/15, so she has yet to start the medication. Counseled patient on need to wait 4-8 weeks to see full effect and to avoid abrupt discontinuation.   Plan  Continue current medications  Misc/OTC   APAP 500 mg q6hr PRN  Aspirin 81 mg daily  Docusate 100 mg daily PRN  Doxycycline 100 mg BID x 7 days (completes course  11/01/19)  Hydrocodone-homatropine 5-1.5 mg/32mL syrup Magnesium 400 mg BID  Nystatin 100,000u/mL susp (patient recently resumed due to mouth sores)  Potassium chloride SA 20 mEq daily  Vitron/C 65 mg Fe, 120 mg Vitamin C daily   Plan  Continue current medications  Medication Management   Pt uses [facility] for all medications. [facility]. Has a chart of when she takes her medications, as well as  a pillbox which she fills. She does report some difficulties with managing her medications and affording her medications (especially her inhalers). She reports her income is $12,267 but is unsure of her husband's income.   Plan  Patient referred to [facility] counselors to help patient apply for Extra Help SSI program. If patient inelgibile, will plan to investigate patient assistance programs for her inhalers.    Follow up: 3 weeks  Doristine Section [phone]

## 2019-11-16 NOTE — Op Note (Addendum)
[facility] Patient Name: [name] Procedure Date : 11/16/2019 MRN: [id number] Attending MD: Juanita Craver , MD Date of Birth: 12/16/1945 CSN: [id number] Age: 74 Admit Type: Inpatient Procedure:                Diagnostic EGD. Indications:              Iron deficiency anemia secondary to chronic blood                            loss, Hematochezia, Weight loss Providers:                Juanita Craver, MD, Benay Pillow, RN, Theodora Blow,                            Technician, Viann Fish, CRNA. Referring MD:             THP Medicines:                Monitored Anesthesia Care Complications:            No immediate complications. Estimated Blood Loss:     Estimated blood loss: none. Procedure:                Pre-Anesthesia Assessment: - Prior to the                            procedure, a history and physical was performed,                            and patient medications and allergies were                            reviewed. The patient's tolerance of previous                            anesthesia was also reviewed. The risks and                            benefits of the procedure and the sedation options                            and risks were discussed with the patient. All                            questions were answered, and informed consent was                            obtained. Prior Anticoagulants: The patient has                            taken no previous anticoagulant or antiplatelet                            agents except for aspirin. ASA Grade Assessment:  III - A patient with severe systemic disease. After                            reviewing the risks and benefits, the patient was                            deemed in satisfactory condition to undergo the                            procedure. After obtaining informed consent, the                            endoscope was passed under direct vision.                             Throughout the procedure, the patient's blood                            pressure, pulse, and oxygen saturations were                            monitored continuously. The GIF-[id number] ([id number])                            Olympus gastroscope was introduced through the                            mouth, and advanced to the second part of duodenum.                            The EGD was accomplished without difficulty. The                            patient tolerated the procedure well. Scope In: Scope Out: Findings:      The examined esophagus and the GEJ appeared widely patent and normal.      A few dispersed erosions with stigmata of recent bleeding were found in       the gastric antrum.      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      The examined duodenum was normal. Impression:               - No specimens collected. Moderate Sedation:      MAC used. Recommendation:           - Clear liquid diet today.                           - Continue present medications.                           - Monitor serial CBC's for now. Procedure Code(s):        --- Professional ---  22979, Esophagogastroduodenoscopy, flexible,                            transoral; diagnostic, including collection of                            specimen(s) by brushing or washing, when performed                            (separate procedure) Diagnosis Code(s):        --- Professional ---                           D50.0, Iron deficiency anemia secondary to blood                            loss (chronic)                           K92.1, Melena (includes Hematochezia)                           R63.4, Abnormal weight loss CPT copyright 2019 American Medical Association. All rights reserved. The codes documented in this report are preliminary and upon coder review may  be revised to meet current compliance  requirements. Juanita Craver, MD Juanita Craver, MD 11/16/2019 12:59:28 PM This report has been signed electronically. Number of Addenda: 0

## 2019-11-16 NOTE — Transfer of Care (Signed)
Immediate Anesthesia Transfer of Care Note  Patient: [name]  Procedure(s) Performed: ESOPHAGOGASTRODUODENOSCOPY (EGD) WITH PROPOFOL (N/A ) COLONOSCOPY WITH PROPOFOL (N/A )  Patient Location: Endoscopy Unit  Anesthesia Type:MAC  Level of Consciousness: awake, alert  and oriented  Airway & Oxygen Therapy: Patient Spontanous Breathing  Post-op Assessment: Report given to RN and Post -op Vital signs reviewed and stable  Post vital signs: Reviewed and stable  Last Vitals:  Vitals Value Taken Time  BP 128/50 11/16/19 1255  Temp    Pulse 71 11/16/19 1257  Resp 22 11/16/19 1257  SpO2 98 % 11/16/19 1257  Vitals shown include unvalidated device data.  Last Pain:  Vitals:   11/16/19 1255  TempSrc: Oral  PainSc: 0-No pain         Complications: No apparent anesthesia complications

## 2019-11-16 NOTE — Anesthesia Preprocedure Evaluation (Addendum)
Anesthesia Evaluation  Patient identified by MRN, date of birth, ID band Patient awake    Reviewed: Allergy & Precautions, NPO status , Patient's Chart, lab work & pertinent test results  Airway Mallampati: I  TM Distance: >3 FB Neck ROM: Full    Dental no notable dental hx.    Pulmonary asthma ,    Pulmonary exam normal breath sounds clear to auscultation       Cardiovascular hypertension, Pt. on medications Normal cardiovascular exam+ dysrhythmias Atrial Fibrillation  Rhythm:Regular Rate:Normal  ECHO 4/21 Left Ventricle: Left ventricular ejection fraction, by estimation, is 55  to 60%. The left ventricle has normal function. The left ventricle has no  regional wall motion abnormalities. The left ventricular internal cavity  size was normal in size. There is  mild concentric left ventricular hypertrophy. The interventricular septum  is flattened in diastole ('D' shaped left ventricle), consistent with  right ventricular volume overload. Left ventricular diastolic function  could not be evaluated due to atrial  fibrillation. Left ventricular diastolic function could not be evaluated.    Neuro/Psych PSYCHIATRIC DISORDERS Anxiety Depression CVA    GI/Hepatic negative GI ROS, Neg liver ROS,   Endo/Other  negative endocrine ROS  Renal/GU Renal diseaseH/O renal transplant  negative genitourinary   Musculoskeletal  (+) Arthritis , Osteoarthritis,    Abdominal   Peds  Hematology  (+) Blood dyscrasia, anemia ,   Anesthesia Other Findings   Reproductive/Obstetrics negative OB ROS                            Anesthesia Physical  Anesthesia Plan  ASA: III  Anesthesia Plan: MAC   Post-op Pain Management:    Induction: Intravenous  PONV Risk Score and Plan: 2  Airway Management Planned: Simple Face Mask, Nasal Cannula and Mask  Additional Equipment:   Intra-op Plan:   Post-operative  Plan:   Informed Consent: I have reviewed the patients History and Physical, chart, labs and discussed the procedure including the risks, benefits and alternatives for the proposed anesthesia with the patient or authorized representative who has indicated his/her understanding and acceptance.       Plan Discussed with: CRNA, Surgeon and Anesthesiologist  Anesthesia Plan Comments:         Anesthesia Quick Evaluation

## 2019-11-17 ENCOUNTER — Encounter: Payer: Self-pay | Admitting: *Deleted

## 2019-11-17 ENCOUNTER — Ambulatory Visit: Payer: Self-pay | Admitting: *Deleted

## 2019-11-17 ENCOUNTER — Other Ambulatory Visit: Payer: Self-pay | Admitting: *Deleted

## 2019-11-17 ENCOUNTER — Other Ambulatory Visit: Payer: Medicare Other

## 2019-11-17 ENCOUNTER — Ambulatory Visit: Payer: Medicare Other

## 2019-11-17 DIAGNOSIS — D849 Immunodeficiency, unspecified: Secondary | ICD-10-CM | POA: Diagnosis not present

## 2019-11-17 DIAGNOSIS — K922 Gastrointestinal hemorrhage, unspecified: Secondary | ICD-10-CM | POA: Diagnosis not present

## 2019-11-17 DIAGNOSIS — I10 Essential (primary) hypertension: Secondary | ICD-10-CM | POA: Diagnosis not present

## 2019-11-17 DIAGNOSIS — D649 Anemia, unspecified: Secondary | ICD-10-CM | POA: Diagnosis not present

## 2019-11-17 LAB — CBC
HCT: 30.2 % — ABNORMAL LOW (ref 36.0–46.0)
Hemoglobin: 9.6 g/dL — ABNORMAL LOW (ref 12.0–15.0)
MCH: 27.8 pg (ref 26.0–34.0)
MCHC: 31.8 g/dL (ref 30.0–36.0)
MCV: 87.5 fL (ref 80.0–100.0)
Platelets: 160 10*3/uL (ref 150–400)
RBC: 3.45 MIL/uL — ABNORMAL LOW (ref 3.87–5.11)
RDW: 18.2 % — ABNORMAL HIGH (ref 11.5–15.5)
WBC: 7.5 10*3/uL (ref 4.0–10.5)
nRBC: 0 % (ref 0.0–0.2)

## 2019-11-17 LAB — HEMOGLOBIN AND HEMATOCRIT, BLOOD
HCT: 24.7 % — ABNORMAL LOW (ref 36.0–46.0)
HCT: 24.8 % — ABNORMAL LOW (ref 36.0–46.0)
HCT: 27.2 % — ABNORMAL LOW (ref 36.0–46.0)
HCT: 28.8 % — ABNORMAL LOW (ref 36.0–46.0)
Hemoglobin: 7.6 g/dL — ABNORMAL LOW (ref 12.0–15.0)
Hemoglobin: 7.7 g/dL — ABNORMAL LOW (ref 12.0–15.0)
Hemoglobin: 8.6 g/dL — ABNORMAL LOW (ref 12.0–15.0)
Hemoglobin: 9.2 g/dL — ABNORMAL LOW (ref 12.0–15.0)

## 2019-11-17 LAB — PREPARE RBC (CROSSMATCH)

## 2019-11-17 MED ORDER — SODIUM CHLORIDE 0.9% IV SOLUTION: Freq: Once | INTRAVENOUS | Status: DC

## 2019-11-17 MED ORDER — BOOST PLUS PO LIQD
237.0000 mL | Freq: Three times a day (TID) | ORAL | Status: DC
  Administered 2019-11-17 – 2019-11-18 (×2): 237 mL via ORAL
  Filled 2019-11-17 (×4): qty 237

## 2019-11-17 MED ORDER — ADULT MULTIVITAMIN W/MINERALS CH
1.0000 | ORAL_TABLET | Freq: Every day | ORAL | Status: DC
  Administered 2019-11-17 – 2019-11-18 (×2): 1 via ORAL
  Filled 2019-11-17: qty 1

## 2019-11-17 NOTE — Patient Outreach (Signed)
[facility]) Care Management  11/17/2019  CHEYAN FREES 12-Mar-1946 [id number]    CSW attempted outreach patient for initial phone assessment and was unsuccessful.  CSW will send unsuccessful outreach letter to patient and attempt again in 3-4 business days if not return call .  Eduard Clos, MSW, [place] Worker  [place] [phone]

## 2019-11-17 NOTE — Progress Notes (Addendum)
Initial Nutrition Assessment  DOCUMENTATION CODES:   Not applicable  INTERVENTION:    Boost Plus chocolate TID- Each supplement provides 360kcal and 14g protein.    MVI daily    NUTRITION DIAGNOSIS:   Increased nutrient needs related to acute illness as evidenced by estimated needs.  GOAL:   Patient will meet greater than or equal to 90% of their needs  MONITOR:   PO intake, Supplement acceptance, Weight trends, Labs, I & O's, Diet advancement, Skin  REASON FOR ASSESSMENT:   Malnutrition Screening Tool    ASSESSMENT:   Patient with PMH significant for CHF, HTN, ESRD s/p renal transplant 2017, CVA, PVD, cardiomyopathy, and s/p PPM. Presents this admission with GI bleed.   EGD/colonoscopy negative yesterday.   Spoke with pt via phone. Reports gradual decline in intake over the last two years due to unknown reasons. States she typically eats three small meals daily that consist of B- chocolate Boost with meds  L- boiled eggs, oatmeal D-meat, vegetable, grain. Portion sizes have decreased over the last few weeks due to "being sick." Pt eager to have "real food" this admission. Tolerating clears per reports. RD to provide supplementation to maximize kcal and protein this admission.   Pt endorses a UBW of 300 lb with the last time at that weight two years ago. Records indicate pt weighed 156 lb on year ago and 139 lb this admission (10.8% loss, insignificant for time frame). Suspect malnutrition but unable to diagnose without NFPE.   Medications: ferrous sulfate, 80 mg lasix BID, Mag-ox, prednisone, Vitamin C 250 mg daily  Labs: reviewed   Diet Order:   Diet Order            Diet clear liquid Room service appropriate? No; Fluid consistency: Thin  Diet effective now              EDUCATION NEEDS:   Education needs have been addressed  Skin:  Skin Assessment: Skin Integrity Issues: Skin Integrity Issues:: Other (Comment) Other: MASD- groin  Last BM:  5/3  Height:    Ht Readings from Last 1 Encounters:  11/16/19 5\' 5"  (1.651 m)    Weight:   Wt Readings from Last 1 Encounters:  11/16/19 63 kg    BMI:  Body mass index is 23.13 kg/m.  Estimated Nutritional Needs:   Kcal:  1700-1900 kcal  Protein:  85-100 grams  Fluid:  >/= 1.7 L/day   Mariana Single RD, LDN Clinical Nutrition Pager listed in [place]

## 2019-11-17 NOTE — Progress Notes (Signed)
Subjective: No complaints.  No further hematochezia.  Objective: Vital signs in last 24 hours: Temp:  [97.3 F (36.3 C)-98.7 F (37.1 C)] 98.7 F (37.1 C) (05/04 0441) Pulse Rate:  [65-79] 70 (05/04 0441) Resp:  [10-24] 22 (05/04 0441) BP: (128-179)/(50-96) 139/73 (05/04 0400) SpO2:  [90 %-100 %] 95 % (05/04 0441) Weight:  [63 kg] 63 kg (05/03 1147) Last BM Date: 11/16/19  Intake/Output from previous day: 05/03 0701 - 05/04 0700 In: 400 [IV Piggyback:400] Out: -  Intake/Output this shift: No intake/output data recorded.  General appearance: alert and no distress GI: soft, non-tender; bowel sounds normal; no masses,  no organomegaly  Lab Results: Recent Labs    11/14/19 2310 11/14/19 2310 11/15/19 0028 11/15/19 0515 11/16/19 0325 11/16/19 1341 11/16/19 1633 11/17/19 0000 11/17/19 0557  WBC 10.9*  --  11.2*  --  8.5  --   --   --   --   HGB 8.9*   < > 8.8*   < > 7.7*   < > 8.7* 8.6* 7.7*  HCT 28.8*   < > 28.2*   < > 24.6*   < > 26.8* 27.2* 24.8*  PLT 160  --  154  --  128*  --   --   --   --    < > = values in this interval not displayed.   BMET Recent Labs    11/14/19 2310 11/15/19 0028 11/16/19 0325  NA 142 142 142  K 4.6 4.5 3.6  CL 105 105 106  CO2 29 29 26   GLUCOSE 127* 123* 83  BUN 42* 43* 37*  CREATININE 2.05* 2.07* 2.14*  CALCIUM 9.3 9.3 9.1   LFT Recent Labs    11/15/19 0028  PROT 5.5*  ALBUMIN 3.2*  AST 13*  ALT 12  ALKPHOS 97  BILITOT 1.5*   PT/INR Recent Labs    11/15/19 0028  LABPROT 15.4*  INR 1.3*   Hepatitis Panel No results for input(s): HEPBSAG, HCVAB, HEPAIGM, HEPBIGM in the last 72 hours. C-Diff No results for input(s): CDIFFTOX in the last 72 hours. Fecal Lactopherrin No results for input(s): FECLLACTOFRN in the last 72 hours.  Studies/Results: No results found.  Medications:  Scheduled: . sodium chloride   Intravenous Once  . Chlorhexidine Gluconate Cloth  6 each Topical Daily  . DULoxetine  30 mg Oral  Daily  . ferrous sulfate  325 mg Oral Daily  . fluticasone furoate-vilanterol  1 puff Inhalation Daily  . furosemide  80 mg Oral BID  . magnesium oxide  800 mg Oral BID  . mycophenolate  180 mg Oral BID  . nystatin  5 mL Oral QID  . predniSONE  5 mg Oral Q breakfast  . tacrolimus  2 mg Oral BID  . vitamin C  250 mg Oral Daily   Continuous: . pantoprazole (PROTONIX) IV 80 mg (11/16/19 2201)    Assessment/Plan: 1) GI bleed - ? Diverticular bleed. 2) Anemia. 3) ESRD.   The patient's HGB did drop down, but there was no overt evidence of bleeding overnight.  The EGD/colonoscopy was negative for any active bleeding, but she was noted to have diverticula.  Presumptively, this appears to be the source with her complaints of hematochezia on admission.  Plan: 1) Follow HGB and transfuse as necessary. 2) If HGB is stable and there are no further issues with bleeding, then she can be discharged home tomorrow.  LOS: 3 days   [name] D 11/17/2019, 7:42 AM

## 2019-11-17 NOTE — Progress Notes (Signed)
PROGRESS NOTE  [name] SEG:[id number] DOB: 09-17-1945 DOA: 11/14/2019 PCP: [name], NP   LOS: 3 days   Brief Narrative / Interim history: 74 year old female with history of ESRD status post renal transplant 2017 on chronic immunosuppression, A. fib status post ablation not on anticoagulation, cardiomyopathy, pacemaker, prior PE who came into the hospital with rectal bleeding.  She has been having several episodes of large-volume bright red blood per rectum.  She had no abdominal pain, no nausea or vomiting.  She tells me she has had an episode like this several months ago, was evaluated by Dr. Collene Mares with gastroenterology as an outpatient, underwent a colonoscopy earlier this year and as far as the patient knows it was unremarkable.  She is not sure whether she was diagnosed with diverticulosis.  She uses aspirin daily but not any NSAIDs or any other blood thinners.  GI consulted, underwent a colonoscopy which showed diverticulosis and an EGD which was fairly unremarkable  Subjective / 24h Interval events: Had no further bleeding, she is feeling overall well.  She is asking about discharge.  Assessment & Plan: Principal Problem Lower GI bleed with acute blood loss anemia-GI consulted, appreciate input.  Continue to monitor hemoglobin. -Underwent colonoscopy with Dr. Collene Mares yesterday and showed diverticulosis -EGD yesterday as well without acute findings -Hemoglobin dropped this morning to 7.7 and 7.6 on repeat, she clinically does not have any more bleeding, will transfuse another unit of packed red blood cells and continue to monitor for another 24 hours per GI, hopefully home tomorrow  Active Problems Renal transplant/immunosuppression -her baseline creatinine is around 2.0-2.3, around chronic kidney disease stage IV range.  She is making urine, continue immunosuppressants.  Avoid nephrotoxins.  Had renal transplant in 2017 -Kidney function remains stable for her  Atrial  fibrillation status post watchman procedure-not on anticoagulation, monitor  Essential hypertension -blood pressure improved and she is no longer hypotensive.  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 sodium chloride   Intravenous Once  . Chlorhexidine Gluconate Cloth  6 each Topical Daily  . DULoxetine  30 mg Oral Daily  . ferrous sulfate  325 mg Oral Daily  . fluticasone furoate-vilanterol  1 puff Inhalation Daily  . furosemide  80 mg Oral BID  . lactose free nutrition  237 mL Oral TID WC  . magnesium oxide  800 mg Oral BID  . multivitamin with minerals  1 tablet Oral Daily  . mycophenolate  180 mg Oral BID  . nystatin  5 mL Oral QID  . predniSONE  5 mg Oral Q breakfast  . tacrolimus  2 mg Oral BID  . vitamin C  250 mg Oral Daily   Continuous Infusions: . pantoprazole (PROTONIX) IV 80 mg (11/17/19 0911)   PRN Meds:.acetaminophen, albuterol, benzonatate, docusate sodium, ondansetron **OR** ondansetron (ZOFRAN) IV  DVT prophylaxis: SCDs Code Status: Full code Family Communication: Discussed with patient  Status is: Inpatient  Remains inpatient appropriate because: Hemoglobin dropped again today, continue to observe for the next 24 hours per GI  Dispo: The patient is from: Home              Anticipated d/c is to: Home              Anticipated d/c date is:  1 day              Patient currently is not medically stable to d/c.  Consultants:  GI  Procedures:  Colonoscopy 5/3 Endoscopy 5/3  Microbiology  None   Antimicrobials: None     Objective: Vitals:   11/17/19 0756 11/17/19 0803 11/17/19 1027 11/17/19 1110  BP: 140/71  140/81 (!) 146/80  Pulse: 72  71 70  Resp: 19  18 15   Temp: (!) 97.4 F (36.3 C)  (!) 97.3 F (36.3 C) (!) 97.4 F  (36.3 C)  TempSrc: Oral  Oral Oral  SpO2: 98% 99% 98% 99%  Weight:      Height:        Intake/Output Summary (Last 24 hours) at 11/17/2019 1352 Last data filed at 11/17/2019 0300 Gross per 24 hour  Intake 400 ml  Output --  Net 400 ml   Filed Weights   11/14/19 1448 11/16/19 1147  Weight: 63 kg 63 kg    Examination:  Constitutional: No distress Eyes: No scleral icterus ENMT: Moist mucous membranes Neck: normal, supple Respiratory: Clear to auscultation bilaterally, no wheezing, no crackles Cardiovascular: Regular rate and rhythm, no murmurs appreciated Abdomen: Soft, nontender, nondistended, positive bowel sounds Musculoskeletal: no clubbing / cyanosis.  Skin: No rashes appreciated Neurologic: Nonfocal, equal strength  Data Reviewed: I have independently reviewed following labs and imaging studies   CBC: Recent Labs  Lab 11/14/19 1553 11/14/19 1608 11/14/19 2310 11/14/19 2310 11/15/19 0028 11/15/19 0515 11/16/19 0325 11/16/19 0325 11/16/19 1341 11/16/19 1633 11/17/19 0000 11/17/19 0557 11/17/19 1026  WBC 10.1  --  10.9*  --  11.2*  --  8.5  --   --   --   --   --   --   NEUTROABS 8.5*  --   --   --   --   --   --   --   --   --   --   --   --   HGB 9.8*   < > 8.9*   < > 8.8*   < > 7.7*   < > 8.6* 8.7* 8.6* 7.7* 7.6*  HCT 31.7*   < > 28.8*   < > 28.2*   < > 24.6*   < > 27.2* 26.8* 27.2* 24.8* 24.7*  MCV 84.8  --  85.2  --  85.2  --  83.7  --   --   --   --   --   --   PLT 148*  --  160  --  154  --  128*  --   --   --   --   --   --    < > = values in this interval not displayed.   Basic Metabolic Panel: Recent Labs  Lab 11/14/19 1608 11/14/19 2310 11/15/19 0028 11/16/19 0325  NA 138 142 142 142  K 4.0 4.6 4.5 3.6  CL 103 105 105 106  CO2  --  29 29 26   GLUCOSE 111* 127* 123* 83  BUN 46* 42* 43* 37*  CREATININE 2.30* 2.05* 2.07* 2.14*  CALCIUM  --  9.3 9.3 9.1  MG  --  1.8  --  1.8  PHOS  --  3.1  --   --    Liver Function Tests: Recent Labs   Lab 11/14/19 2310 11/15/19 0028  AST 14* 13*  ALT 13 12  ALKPHOS 96 97  BILITOT 1.1 1.5*  PROT 5.4* 5.5*  ALBUMIN  3.2* 3.2*   Coagulation Profile: Recent Labs  Lab 11/15/19 0028  INR 1.3*   HbA1C: No results for input(s): HGBA1C in the last 72 hours. CBG: No results for input(s): GLUCAP in the last 168 hours.  Recent Results (from the past 240 hour(s))  Respiratory Panel by RT PCR (Flu A&B, Covid) - Nasopharyngeal Swab     Status: None   Collection Time: 11/14/19  5:27 PM   Specimen: Nasopharyngeal Swab  Result Value Ref Range Status   SARS Coronavirus 2 by RT PCR NEGATIVE NEGATIVE Final    Comment: (NOTE) SARS-CoV-2 target nucleic acids are NOT DETECTED. The SARS-CoV-2 RNA is generally detectable in upper respiratoy specimens during the acute phase of infection. The lowest concentration of SARS-CoV-2 viral copies this assay can detect is 131 copies/mL. A negative result does not preclude SARS-Cov-2 infection and should not be used as the sole basis for treatment or other patient management decisions. A negative result may occur with  improper specimen collection/handling, submission of specimen other than nasopharyngeal swab, presence of viral mutation(s) within the areas targeted by this assay, and inadequate number of viral copies (<131 copies/mL). A negative result must be combined with clinical observations, patient history, and epidemiological information. The expected result is Negative. Fact Sheet for Patients:  PinkCheek.be Fact Sheet for Healthcare Providers:  GravelBags.it This test is not yet ap proved or cleared by the [place] FDA and  has been authorized for detection and/or diagnosis of SARS-CoV-2 by FDA under an Emergency Use Authorization (EUA). This EUA will remain  in effect (meaning this test can be used) for the duration of the COVID-19 declaration under Section 564(b)(1) of the Act,  21 U.S.C. section 360bbb-3(b)(1), unless the authorization is terminated or revoked sooner.    Influenza A by PCR NEGATIVE NEGATIVE Final   Influenza B by PCR NEGATIVE NEGATIVE Final    Comment: (NOTE) The Xpert Xpress SARS-CoV-2/FLU/RSV assay is intended as an aid in  the diagnosis of influenza from Nasopharyngeal swab specimens and  should not be used as a sole basis for treatment. Nasal washings and  aspirates are unacceptable for Xpert Xpress SARS-CoV-2/FLU/RSV  testing. Fact Sheet for Patients: PinkCheek.be Fact Sheet for Healthcare Providers: GravelBags.it This test is not yet approved or cleared by the [place] FDA and  has been authorized for detection and/or diagnosis of SARS-CoV-2 by  FDA under an Emergency Use Authorization (EUA). This EUA will remain  in effect (meaning this test can be used) for the duration of the  Covid-19 declaration under Section 564(b)(1) of the Act, 21  U.S.C. section 360bbb-3(b)(1), unless the authorization is  terminated or revoked. Performed at [facility], [address] [place], [place] [address]      Radiology Studies: No results found.  Marzetta Board, MD, PhD [facility]  Between 7 am - 7 pm I am available, please contact me via Amion or Securechat  Between 7 pm - 7 am I am not available, please contact night coverage MD/APP via Amion

## 2019-11-17 NOTE — Anesthesia Postprocedure Evaluation (Signed)
Anesthesia Post Note  Patient: [name]  Procedure(s) Performed: ESOPHAGOGASTRODUODENOSCOPY (EGD) WITH PROPOFOL (N/A ) COLONOSCOPY WITH PROPOFOL (N/A )     Patient location during evaluation: PACU Anesthesia Type: MAC Level of consciousness: awake and alert Pain management: pain level controlled Vital Signs Assessment: post-procedure vital signs reviewed and stable Respiratory status: spontaneous breathing, nonlabored ventilation, respiratory function stable and patient connected to nasal cannula oxygen Cardiovascular status: stable and blood pressure returned to baseline Postop Assessment: no apparent nausea or vomiting Anesthetic complications: no    Last Vitals:  Vitals:   11/17/19 0400 11/17/19 0441  BP: 139/73   Pulse: 79 70  Resp: 20 (!) 22  Temp:  37.1 C  SpO2: 90% 95%    Last Pain:  Vitals:   11/17/19 0441  TempSrc: Oral  PainSc:                  Starsky Nanna

## 2019-11-18 DIAGNOSIS — D849 Immunodeficiency, unspecified: Secondary | ICD-10-CM | POA: Diagnosis not present

## 2019-11-18 DIAGNOSIS — D649 Anemia, unspecified: Secondary | ICD-10-CM | POA: Diagnosis not present

## 2019-11-18 DIAGNOSIS — Z940 Kidney transplant status: Secondary | ICD-10-CM | POA: Diagnosis not present

## 2019-11-18 DIAGNOSIS — K922 Gastrointestinal hemorrhage, unspecified: Secondary | ICD-10-CM | POA: Diagnosis not present

## 2019-11-18 LAB — BPAM RBC
Blood Product Expiration Date: 202106032359
ISSUE DATE / TIME: 202105041047
Unit Type and Rh: 5100

## 2019-11-18 LAB — TYPE AND SCREEN
ABO/RH(D): O POS
Antibody Screen: NEGATIVE
Unit division: 0

## 2019-11-18 LAB — HEMOGLOBIN AND HEMATOCRIT, BLOOD
HCT: 29.4 % — ABNORMAL LOW (ref 36.0–46.0)
Hemoglobin: 9.3 g/dL — ABNORMAL LOW (ref 12.0–15.0)

## 2019-11-18 MED ORDER — PANTOPRAZOLE SODIUM 40 MG PO TBEC
40.0000 mg | DELAYED_RELEASE_TABLET | Freq: Every day | ORAL | 0 refills | Status: DC
Start: 2019-11-18 — End: 2019-12-29

## 2019-11-18 MED ORDER — ONDANSETRON HCL 4 MG PO TABS: 4.0000 mg | ORAL_TABLET | Freq: Four times a day (QID) | ORAL | 0 refills | Status: DC | PRN

## 2019-11-18 MED ORDER — CARVEDILOL 12.5 MG PO TABS: 25.0000 mg | ORAL_TABLET | Freq: Two times a day (BID) | ORAL | Status: DC

## 2019-11-18 NOTE — TOC Transition Note (Addendum)
Transition of Care [facility]) - CM/SW Discharge Note Marvetta Gibbons RN, BSN Transitions of Care Unit 4E- RN Case Manager [phone]   Patient Details  Name: LORYN HAACKE MRN: [id number] Date of Birth: February 13, 1946  Transition of Care [facility] CM/SW Contact:  Dawayne Patricia, RN Phone Number: 11/18/2019, 10:24 AM   Clinical Narrative:    Pt admitted from home with spouse with GIB, stable for transition home today. Pt has PCP to f/u with , No TOC needs noted.  Received call post discharge from [facility] with [facility] that pt is active with them for [facility] PT/OT- call made to MD for resumption of care orders verbal order received to resume [place] as previously ordered- orders placed and Butch Penny with [facility] notified to resume [facility] services.    Final next level of care: Home/Self Care Barriers to Discharge: No Barriers Identified   Patient Goals and CMS Choice Patient states their goals for this hospitalization and ongoing recovery are:: return home   Choice offered to / list presented to : NA  Discharge Placement               Home        Discharge Plan and Services   Discharge Planning Services: NA Post Acute Care Choice: NA          DME Arranged: N/A DME Agency: NA         HH Agency: NA        Social Determinants of Health (SDOH) Interventions     Readmission Risk Interventions Readmission Risk Prevention Plan 11/18/2019  Transportation Screening Complete  PCP or Specialist Appt within 3-5 Days Complete  HRI or [place] Complete  Social Work Consult for [place] Planning/Counseling Complete  Palliative Care Screening Not Applicable  Medication Review Press photographer) Complete  Some recent data might be hidden

## 2019-11-18 NOTE — Progress Notes (Signed)
Discharge instructions given to Mrs and Mr Leveille.  Discussed new medications, medication changes and side effects.  Discussed signs and symptoms to watch for and when to contact the physician.  Discussed activities and follow up appointments.  Verbalized understanding.

## 2019-11-18 NOTE — Care Management Important Message (Signed)
Important Message  Patient Details  Name: [name] MRN: [id number] Date of Birth: 01-30-1946   Medicare Important Message Given:  Yes     Shelda Altes 11/18/2019, 10:34 AM

## 2019-11-18 NOTE — Discharge Summary (Signed)
Physician Discharge Summary  [name] DDU:[id number] DOB: 1946-01-28 DOA: 11/14/2019  PCP: [name], NP  Admit date: 11/14/2019 Discharge date: 11/18/2019  Admitted From: Home Disposition: Home  Recommendations for Outpatient Follow-up:  1. Follow up with PCP in 1 week with repeat CBC/BMP 2. Outpatient follow-up with gastroenterology/Dr. Collene Mares 3. Follow up in ED if symptoms worsen or new appear   Home Health: Resume home with PT/OT Equipment/Devices: None  Discharge Condition: Stable CODE STATUS: Full Diet recommendation: Heart healthy  Brief/Interim Summary: 74 year old female with history of ESRD status post renal transplant 2017 on chronic immunosuppression, permanent A. fib status post ablation not on anticoagulation, cardiomyopathy, pacemaker, prior PE presented with rectal bleeding.  GI was consulted and patient underwent EGD and colonoscopy.  She required 2 units packed red cells transfusion during the hospitalization.  Hemoglobin stable this morning.  GI has cleared the patient for discharge.  She will be discharged today with outpatient follow-up with GI.  Discharge Diagnoses:   Lower GI bleed with acute blood loss anemia -Underwent colonoscopy with Dr. Collene Mares which showed diverticulosis -EGD was also done: Without acute findings -She required 2 units packed red cells transfusion during the hospitalization.  Hemoglobin stable this morning at 9.3. -Tolerating diet.  GI has cleared the patient for discharge. - She will be discharged today with outpatient follow-up with GI.  will keep aspirin on hold.  Continue oral iron. -Repeat CBC within a week and follow-up with PCP.  Renal transplant/immunosuppression  -her baseline creatinine is around 2.0-2.3, around chronic kidney disease stage IV range.  She is making urine, continue immunosuppressants.  Avoid nephrotoxins.  Had renal transplant in 2017 -Kidney function remains stable for her -Outpatient  follow-up  Permanent atrial fibrillation status post watchman procedure-not on anticoagulation -Outpatient follow-up  Essential hypertension -blood pressure improved and she is no longer hypotensive.    Resume home regimen on discharge  History of PE -patient used to be on Coumadin, complicated by intracerebral hemorrhage in 2376  Chronic diastolic CHF-most recent 2D echo 3 weeks ago with EF 55-60% outpatient follow-up.  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   albuterol (2.5 MG/3ML) 0.083% nebulizer solution Commonly known as: PROVENTIL Take 3 mLs (2.5 mg total) by nebulization every 6 (six) hours as needed for wheezing or shortness of breath. Dx:J45.909   Breo Ellipta 100-25 MCG/INH Aepb Generic drug: fluticasone furoate-vilanterol Inhale 1 puff into the lungs daily.  carvedilol 12.5 MG tablet Commonly known as: COREG Take 2 tablets (25 mg total) by mouth 2 (two) times daily.   docusate sodium 100 MG capsule Commonly known as: COLACE Take 100 mg  by mouth daily as needed (For constipation.).   DULoxetine 30 MG capsule Commonly known as: CYMBALTA Take 1 capsule (30 mg total) by mouth daily.   furosemide 80 MG tablet Commonly known as: LASIX Take 1 tablet (80 mg total) by mouth 2 (two) times daily.   IRON PO Take 325 mg by mouth daily.   magnesium oxide 400 MG tablet Commonly known as: MAG-OX Take 2 tablets by mouth 2 (two) times daily.   Myfortic 180 MG EC tablet Generic drug: mycophenolate Take 180 mg by mouth 2 (two) times daily.   ondansetron 4 MG tablet Commonly known as: ZOFRAN Take 1 tablet (4 mg total) by mouth every 6 (six) hours as needed for nausea. Notes to patient: **NEW** For nausea   pantoprazole 40 MG tablet Commonly known as: Protonix Take 1 tablet (40 mg total) by mouth daily. Notes to patient: **NEW** To lower stomach acid and possibly decrease risk of bleeding    potassium chloride SA 20 MEQ tablet Commonly known as: KLOR-CON Take 20 mEq by mouth daily.   predniSONE 5 MG tablet Commonly known as: DELTASONE Take 1 tablet (5 mg total) by mouth daily with breakfast. Please resume after you have completed prednisone taper   tacrolimus 1 MG capsule Commonly known as: PROGRAF Take 2 mg by mouth 2 (two) times daily.      Follow-up Information    [name], [name], NP. Schedule an appointment as soon as possible for a visit in 1 week(s).   Specialty: Internal Medicine Why: with repeat cbc/bmp Contact information: [place] [address] [phone]        Juanita Craver, MD. Schedule an appointment as soon as possible for a visit in 1 week(s).   Specialty: Gastroenterology Contact information: [address], [place] [address] [phone]          Allergies  Allergen Reactions  . Ace Inhibitors Other (See Comments)    Reaction unknown  . 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 Warfarin Other (See Comments)    Left side brain hemorrhage  . Warfarin Sodium Other (See Comments)    Caused her to have a stroke    Consultations: GI  Procedures/Studies: DG Chest 1 View  Result Date: 10/23/2019 CLINICAL DATA:  Hypoxemia EXAM: CHEST  1 VIEW COMPARISON:  10/22/2019 FINDINGS: Cardiac shadow remains enlarged. Postsurgical changes are again seen. Occlusion device is noted overlying the left atrial appendage. Multiple coils are also noted within the left atrial appendage. Vascular congestive changes are again seen without significant interstitial edema. No sizable effusion is noted. IMPRESSION: Persistent vascular congestion.  No acute edema is noted. Electronically Signed   By: Inez Catalina M.D.   On: 10/23/2019 15:53   DG Chest 2 View  Result Date: 10/28/2019 CLINICAL DATA:  Pulmonary congestion. EXAM: CHEST - 2 VIEW COMPARISON:  October 23, 2019. FINDINGS: Stable cardiomegaly. Central pulmonary vascular congestion is noted. Left-sided pacemaker is unchanged in position. No consolidative process is noted. No pneumothorax or pleural effusion is noted. Bony thorax is unremarkable. IMPRESSION: Stable cardiomegaly with central pulmonary vascular congestion. Electronically Signed   By: Marijo Conception M.D.   On: 10/28/2019 12:06  DG Chest Port 1 View  Result Date: 10/22/2019 CLINICAL DATA:  Shortness of breath, cough EXAM: PORTABLE CHEST 1 VIEW COMPARISON:  11/14/2018 FINDINGS: Cardiomegaly with left chest single lead pacer defibrillator and Watchman left atrial appendage occlusion device. Mild, diffuse interstitial pulmonary opacity. The visualized skeletal structures are unremarkable. IMPRESSION: Cardiomegaly with mild, diffuse interstitial pulmonary opacity, likely edema. Electronically Signed   By: Eddie Candle M.D.   On: 10/22/2019 11:50   ECHOCARDIOGRAM COMPLETE  Result Date: 10/23/2019    ECHOCARDIOGRAM REPORT   Patient Name:   DEVORA TORTORELLA Date of Exam: 10/23/2019  Medical Rec #:  [id number]     Height:       65.0 in Accession #:    [id number]    Weight:       152.1 lb Date of Birth:  1945-07-28    BSA:          1.761 m Patient Age:    28 years      BP:           140/80 mmHg Patient Gender: F             HR:           70 bpm. Exam Location:  Inpatient Procedure: 2D Echo, Cardiac Doppler and Color Doppler Indications:    R94.31 Abnormal EKG  History:        Patient has prior history of Echocardiogram examinations, most                 recent 02/04/2018. CHF and Cardiomyopathy, Abnormal ECG, COPD and                 Stroke, Arrythmias:Atrial Fibrillation; Risk Factors:Sleep                 Apnea. Pulmonary embolus.  Sonographer:    Roseanna Rainbow RDCS Referring Phys: [id number] [place]  Sonographer Comments: Image acquisition challenging due to respiratory motion. IMPRESSIONS  1. Left ventricular ejection fraction, by estimation, is 55 to 60%. The left ventricle has normal function. The left ventricle has no regional wall motion abnormalities. There is mild concentric left ventricular hypertrophy. Left ventricular diastolic function could not be evaluated. There is the interventricular septum is flattened in diastole ('D' shaped left ventricle), consistent with right ventricular volume overload.  2. Right ventricular systolic function is low normal. The right ventricular size is moderately enlarged. There is moderately elevated pulmonary artery systolic pressure. The estimated right ventricular systolic pressure is 28.4 mmHg.  3. Left atrial size was severely dilated.  4. Right atrial size was severely dilated.  5. The mitral valve is normal in structure. Mild to moderate mitral valve regurgitation. No evidence of mitral stenosis.  6. 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  7. The aortic valve is normal in structure. Aortic valve regurgitation is mild. No aortic stenosis is present.  8. The inferior vena cava is dilated in size with <50%  respiratory variability, suggesting right atrial pressure of 15 mmHg. Comparison(s): Prior images reviewed side by side. The left ventricular function has improved. The right ventricular systolic function is unchanged. FINDINGS  Left Ventricle: Left ventricular ejection fraction, by estimation, is 55 to 60%. The left ventricle has normal function. The left ventricle has no regional wall motion abnormalities. The left ventricular internal cavity size was normal in size. There is  mild concentric left ventricular hypertrophy. The interventricular septum is flattened in diastole ('D' shaped left ventricle), consistent with  right ventricular volume overload. Left ventricular diastolic function could not be evaluated due to atrial fibrillation. Left ventricular diastolic function could not be evaluated. Right Ventricle: The right ventricular size is moderately enlarged. No increase in right ventricular wall thickness. Right ventricular systolic function is low normal. There is moderately elevated pulmonary artery systolic pressure. The tricuspid regurgitant velocity is 3.38 m/s, and with an assumed right atrial pressure of 15 mmHg, the estimated right ventricular systolic pressure is 41.9 mmHg. Left Atrium: Left atrial size was severely dilated. Right Atrium: Right atrial size was severely dilated. Pericardium: There is no evidence of pericardial effusion. Mitral Valve: The mitral valve is normal in structure. Mild to moderate mitral valve regurgitation, with centrally-directed jet. No evidence of mitral valve stenosis. 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 The flow in the hepatic veins is reversed during ventricular systole. Aortic Valve: The aortic valve is normal in structure. Aortic valve regurgitation is mild. No aortic stenosis is present. Pulmonic Valve: The pulmonic valve was normal in structure. Pulmonic valve regurgitation is not visualized. Aorta: The  aortic root and ascending aorta are structurally normal, with no evidence of dilitation. Venous: The inferior vena cava is dilated in size with less than 50% respiratory variability, suggesting right atrial pressure of 15 mmHg. IAS/Shunts: No atrial level shunt detected by color flow Doppler.  LEFT VENTRICLE PLAX 2D LVIDd:         5.40 cm     Diastology LVIDs:         3.72 cm     LV e' lateral:   10.30 cm/s LV PW:         1.42 cm     LV E/e' lateral: 10.0 LV IVS:        1.45 cm     LV e' medial:    7.62 cm/s LVOT diam:     1.80 cm     LV E/e' medial:  13.6 LV SV:         52 LV SV Index:   29 LVOT Area:     2.54 cm  LV Volumes (MOD) LV vol d, MOD A2C: 99.0 ml LV vol d, MOD A4C: 59.7 ml LV vol s, MOD A2C: 50.8 ml LV vol s, MOD A4C: 49.4 ml LV SV MOD A2C:     48.2 ml LV SV MOD A4C:     59.7 ml LV SV MOD BP:      32.2 ml RIGHT VENTRICLE             IVC RV S prime:     11.50 cm/s  IVC diam: 3.29 cm TAPSE (M-mode): 2.1 cm LEFT ATRIUM              Index       RIGHT ATRIUM           Index LA diam:        4.20 cm  2.39 cm/m  RA Area:     16.30 cm LA Vol (A2C):   91.9 ml  52.19 ml/m RA Volume:   38.60 ml  21.92 ml/m LA Vol (A4C):   99.6 ml  56.56 ml/m LA Biplane Vol: 104.0 ml 59.06 ml/m  AORTIC VALVE LVOT Vmax:   107.00 cm/s LVOT Vmean:  75.700 cm/s LVOT VTI:    0.204 m  AORTA Ao Root diam: 3.60 cm MITRAL VALVE                TRICUSPID VALVE MV  Area (PHT): 4.25 cm     TR Peak grad:   45.7 mmHg MV Decel Time: 178 msec     TR Vmax:        338.00 cm/s MV E velocity: 103.50 cm/s                             SHUNTS                             Systemic VTI:  0.20 m                             Systemic Diam: 1.80 cm Sanda Klein MD Electronically signed by Sanda Klein MD Signature Date/Time: 10/23/2019/4:27:04 PM    Final     EGD on 11/16/2019 Findings:      The examined esophagus and the GEJ appeared widely patent and normal.      A few dispersed erosions with stigmata of recent bleeding were found in       the  gastric antrum.      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      The examined duodenum was normal. Impression:               - No specimens collected. Moderate Sedation:      MAC used. Recommendation:           - Clear liquid diet today.                           - Continue present medications.                           - Monitor serial CBC's for now.  Colonoscopy on 11/16/2019 Impression:               - Diverticulosis in the sigmoid colon.                           - The examination was otherwise normal on direct                            and retroflexion views.                           - No specimens collected. Moderate Sedation:      MAC used. Recommendation:           - Clear liquid diet today.                           - Continue present medications.                           - Continue serial CBC's and TSH levels.   Subjective: Patient seen and examined at bedside.  She denies any overnight neck or bloody stools.  Tolerating diet.  Wants to go home today.  Discharge Exam: Vitals:   11/18/19 0439 11/18/19 0855  BP:  133/68  Pulse:  70  Resp: 20 20  Temp: 98.5 F (36.9 C) 98.3 F (36.8 C)  SpO2:  99%    General: Pt is alert, awake, not in acute distress Cardiovascular: rate controlled, S1/S2 + Respiratory: bilateral decreased breath sounds at bases Abdominal: Soft, NT, ND, bowel sounds + Extremities: Trace lower extremity edema, no cyanosis    The results of significant diagnostics from this hospitalization (including imaging, microbiology, ancillary and laboratory) are listed below for reference.     Microbiology: Recent Results (from the past 240 hour(s))  Respiratory Panel by RT PCR (Flu A&B, Covid) - Nasopharyngeal Swab     Status: None   Collection Time: 11/14/19  5:27 PM   Specimen: Nasopharyngeal Swab  Result Value Ref Range Status   SARS Coronavirus 2 by RT PCR NEGATIVE  NEGATIVE Final    Comment: (NOTE) SARS-CoV-2 target nucleic acids are NOT DETECTED. The SARS-CoV-2 RNA is generally detectable in upper respiratoy specimens during the acute phase of infection. The lowest concentration of SARS-CoV-2 viral copies this assay can detect is 131 copies/mL. A negative result does not preclude SARS-Cov-2 infection and should not be used as the sole basis for treatment or other patient management decisions. A negative result may occur with  improper specimen collection/handling, submission of specimen other than nasopharyngeal swab, presence of viral mutation(s) within the areas targeted by this assay, and inadequate number of viral copies (<131 copies/mL). A negative result must be combined with clinical observations, patient history, and epidemiological information. The expected result is Negative. Fact Sheet for Patients:  PinkCheek.be Fact Sheet for Healthcare Providers:  GravelBags.it This test is not yet ap proved or cleared by the [place] FDA and  has been authorized for detection and/or diagnosis of SARS-CoV-2 by FDA under an Emergency Use Authorization (EUA). This EUA will remain  in effect (meaning this test can be used) for the duration of the COVID-19 declaration under Section 564(b)(1) of the Act, 21 U.S.C. section 360bbb-3(b)(1), unless the authorization is terminated or revoked sooner.    Influenza A by PCR NEGATIVE NEGATIVE Final   Influenza B by PCR NEGATIVE NEGATIVE Final    Comment: (NOTE) The Xpert Xpress SARS-CoV-2/FLU/RSV assay is intended as an aid in  the diagnosis of influenza from Nasopharyngeal swab specimens and  should not be used as a sole basis for treatment. Nasal washings and  aspirates are unacceptable for Xpert Xpress SARS-CoV-2/FLU/RSV  testing. Fact Sheet for Patients: PinkCheek.be Fact Sheet for Healthcare  Providers: GravelBags.it This test is not yet approved or cleared by the [place] FDA and  has been authorized for detection and/or diagnosis of SARS-CoV-2 by  FDA under an Emergency Use Authorization (EUA). This EUA will remain  in effect (meaning this test can be used) for the duration of the  Covid-19 declaration under Section 564(b)(1) of the Act, 21  U.S.C. section 360bbb-3(b)(1), unless the authorization is  terminated or revoked. Performed at [facility], [address] [place], [place] [address]      Labs: BNP (last 3 results) Recent Labs    10/22/19 1100  BNP 195.0*   Basic Metabolic Panel: Recent Labs  Lab 11/14/19 1608 11/14/19 2310 11/15/19 0028 11/16/19 0325  NA 138 142 142 142  K 4.0 4.6 4.5 3.6  CL 103 105 105 106  CO2  --  _0 GLUCOSE 111* 127* 123* 83  BUN 46* 42* 43* 37*  CREATININE 2.30* 2.05* 2.07* 2.14*  CALCIUM  --  9.3 9.3 9.1  MG  --  1.8  --  1.8  PHOS  --  3.1  --   --    Liver Function Tests: Recent Labs  Lab 11/14/19 2310 11/15/19 0028  AST 14* 13*  ALT 13 12  ALKPHOS 96 97  BILITOT 1.1 1.5*  PROT 5.4* 5.5*  ALBUMIN 3.2* 3.2*   No results for input(s): LIPASE, AMYLASE in the last 168 hours. No results for input(s): AMMONIA in the last 168 hours. CBC: Recent Labs  Lab 11/14/19 1553 11/14/19 1608 11/14/19 2310 11/14/19 2310 11/15/19 0028 11/15/19 0515 11/16/19 0325 11/16/19 1341 11/17/19 0557 11/17/19 1026 11/17/19 1555 11/17/19 2324 11/18/19 0650  WBC 10.1  --  10.9*  --  11.2*  --  8.5  --   --   --  7.5  --   --   NEUTROABS 8.5*  --   --   --   --   --   --   --   --   --   --   --   --   HGB 9.8*   < > 8.9*   < > 8.8*   < > 7.7*   < > 7.7* 7.6* 9.6* 9.2* 9.3*  HCT 31.7*   < > 28.8*   < > 28.2*   < > 24.6*   < > 24.8* 24.7* 30.2* 28.8* 29.4*  MCV 84.8  --  85.2  --  85.2  --  83.7  --   --   --  87.5  --   --   PLT 148*  --  160  --  154  --  128*  --   --   --   160  --   --    < > = values in this interval not displayed.   Cardiac Enzymes: No results for input(s): CKTOTAL, CKMB, CKMBINDEX, TROPONINI in the last 168 hours. BNP: Invalid input(s): POCBNP CBG: No results for input(s): GLUCAP in the last 168 hours. D-Dimer No results for input(s): DDIMER in the last 72 hours. Hgb A1c No results for input(s): HGBA1C in the last 72 hours. Lipid Profile No results for input(s): CHOL, HDL, LDLCALC, TRIG, CHOLHDL, LDLDIRECT in the last 72 hours. Thyroid function studies No results for input(s): TSH, T4TOTAL, T3FREE, THYROIDAB in the last 72 hours.  Invalid input(s): FREET3 Anemia work up No results for input(s): VITAMINB12, FOLATE, FERRITIN, TIBC, IRON, RETICCTPCT in the last 72 hours. Urinalysis    Component Value Date/Time   COLORURINE AMBER (A) 02/04/2018 1540   APPEARANCEUR CLOUDY (A) 02/04/2018 1540   LABSPEC 1.017 02/04/2018 1540   PHURINE 5.0 02/04/2018 1540   GLUCOSEU NEGATIVE 02/04/2018 1540   HGBUR LARGE (A) 02/04/2018 1540   HGBUR negative 10/04/2009 1044   BILIRUBINUR NEGATIVE 02/04/2018 1540   BILIRUBINUR negative 12/13/2017 1508   KETONESUR NEGATIVE 02/04/2018 1540   PROTEINUR 100 (A) 02/04/2018 1540   UROBILINOGEN 0.2 12/13/2017 1508   UROBILINOGEN 0.2 03/25/2012 1354   NITRITE NEGATIVE 02/04/2018 1540   LEUKOCYTESUR MODERATE (A) 02/04/2018 1540   Sepsis Labs Invalid input(s): PROCALCITONIN,  WBC,  LACTICIDVEN Microbiology Recent Results (from the past 240 hour(s))  Respiratory Panel by RT PCR (Flu A&B, Covid) - Nasopharyngeal Swab     Status: None   Collection Time: 11/14/19  5:27 PM   Specimen: Nasopharyngeal Swab  Result Value Ref Range Status   SARS Coronavirus 2 by RT PCR NEGATIVE NEGATIVE Final    Comment: (NOTE) SARS-CoV-2 target nucleic acids are NOT  DETECTED. The SARS-CoV-2 RNA is generally detectable in upper respiratoy specimens during the acute phase of infection. The lowest concentration of SARS-CoV-2  viral copies this assay can detect is 131 copies/mL. A negative result does not preclude SARS-Cov-2 infection and should not be used as the sole basis for treatment or other patient management decisions. A negative result may occur with  improper specimen collection/handling, submission of specimen other than nasopharyngeal swab, presence of viral mutation(s) within the areas targeted by this assay, and inadequate number of viral copies (<131 copies/mL). A negative result must be combined with clinical observations, patient history, and epidemiological information. The expected result is Negative. Fact Sheet for Patients:  PinkCheek.be Fact Sheet for Healthcare Providers:  GravelBags.it This test is not yet ap proved or cleared by the [place] FDA and  has been authorized for detection and/or diagnosis of SARS-CoV-2 by FDA under an Emergency Use Authorization (EUA). This EUA will remain  in effect (meaning this test can be used) for the duration of the COVID-19 declaration under Section 564(b)(1) of the Act, 21 U.S.C. section 360bbb-3(b)(1), unless the authorization is terminated or revoked sooner.    Influenza A by PCR NEGATIVE NEGATIVE Final   Influenza B by PCR NEGATIVE NEGATIVE Final    Comment: (NOTE) The Xpert Xpress SARS-CoV-2/FLU/RSV assay is intended as an aid in  the diagnosis of influenza from Nasopharyngeal swab specimens and  should not be used as a sole basis for treatment. Nasal washings and  aspirates are unacceptable for Xpert Xpress SARS-CoV-2/FLU/RSV  testing. Fact Sheet for Patients: PinkCheek.be Fact Sheet for Healthcare Providers: GravelBags.it This test is not yet approved or cleared by the [place] FDA and  has been authorized for detection and/or diagnosis of SARS-CoV-2 by  FDA under an Emergency Use Authorization (EUA). This EUA will  remain  in effect (meaning this test can be used) for the duration of the  Covid-19 declaration under Section 564(b)(1) of the Act, 21  U.S.C. section 360bbb-3(b)(1), unless the authorization is  terminated or revoked. Performed at [facility], [address] [place], [place] [address]      Time coordinating discharge: 35 minutes  SIGNED:   Aline August, MD  [facility] 11/18/2019, 11:47 AM

## 2019-11-18 NOTE — Plan of Care (Signed)

## 2019-11-19 ENCOUNTER — Telehealth: Payer: Self-pay | Admitting: Nurse Practitioner

## 2019-11-19 ENCOUNTER — Other Ambulatory Visit: Payer: Self-pay | Admitting: *Deleted

## 2019-11-19 ENCOUNTER — Telehealth: Payer: Self-pay | Admitting: *Deleted

## 2019-11-19 DIAGNOSIS — I130 Hypertensive heart and chronic kidney disease with heart failure and stage 1 through stage 4 chronic kidney disease, or unspecified chronic kidney disease: Secondary | ICD-10-CM | POA: Diagnosis not present

## 2019-11-19 DIAGNOSIS — N183 Chronic kidney disease, stage 3 unspecified: Secondary | ICD-10-CM | POA: Diagnosis not present

## 2019-11-19 DIAGNOSIS — I081 Rheumatic disorders of both mitral and tricuspid valves: Secondary | ICD-10-CM | POA: Diagnosis not present

## 2019-11-19 DIAGNOSIS — I0981 Rheumatic heart failure: Secondary | ICD-10-CM | POA: Diagnosis not present

## 2019-11-19 DIAGNOSIS — I5043 Acute on chronic combined systolic (congestive) and diastolic (congestive) heart failure: Secondary | ICD-10-CM | POA: Diagnosis not present

## 2019-11-19 DIAGNOSIS — D631 Anemia in chronic kidney disease: Secondary | ICD-10-CM | POA: Diagnosis not present

## 2019-11-19 NOTE — Telephone Encounter (Signed)
Erlene Quan is calling and requesting verbal orders for patient to continue with PT and OT. Pls advise. CB is [phone]

## 2019-11-19 NOTE — Telephone Encounter (Signed)
1st attempt. Unable to reach patient.   

## 2019-11-19 NOTE — Telephone Encounter (Signed)
ok 

## 2019-11-19 NOTE — Telephone Encounter (Signed)
Charlotte please advise 

## 2019-11-19 NOTE — Telephone Encounter (Signed)
[name] was notified and given he ok for PT and OT orders.

## 2019-11-19 NOTE — Patient Outreach (Addendum)
[place] [facility]) Care Management  11/19/2019  [name] May 04, 1946 [id number]   Subjective:  Telephone call to patient's home / mobile number times 2, spoke with patient, and HIPAA verified.  States she remembers speaking with this RNCM in the past.  Patient states she is feeling much better, having a good day, and hanging in there.   Discussed her recent readmission on 11/14/2019  and discharge on 11/18/2019.  States her home health services have resumed with [facility] and services are going well.  States she has a follow up visit with primary provider on 11/27/2019 and with pulmonologist on 12/03/2019.  States she is planning to call her gastroenterologist office to schedule hospital follow up appointment  in the near future.   States she will call nephrologist today to clarify Lasix dosage(she is taking 2 80mg  tablets bid versus 1 80 mg tablet bid)  and notify this RNCM if assistance needed.   States she has been given parameter regarding weigh and when to report to MD.  States her weight has stayed the same since hospital discharge and weight is 150.4lbs.  Patient states she does not have any education material, EMMI follow up, care coordination, care management, disease monitoring, transportation, community resource, or pharmacy needs at this time.  States she is very appreciative of the follow up, is in agreement  to receive 1 additional follow up call to assess for further CM needs after MD appointments, and is in agreement to receive [place] Management EMMI follow up calls as needed.  RNCM advise will also discuss care coordination with Embedded Chronic Care Management Lead and update patient on next steps.   Left voicemail and sent internal email to Gary, requesting call back.    Objective: Per KPN (Knowledge Performance Now, point of care tool) and chart review, patient admitted 11/14/2019 - 11/18/2019 for lower GI bleed with acute  blood loss anemia.  Patient hospitalized  10/22/2019 - 10/25/2019 for Acute respiratory failure with hypoxia, Acute on chronic combined systolic and diastolic congestive heart failure.  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      Assessment: Received Medicare EMMI General Discharge Red Flag Alert follow up referral on 11/02/2019. Red Flag Alert Triggers, Day #4, times 2, patient answered yes to the following questions: Lost interest in things?  Sad/hopeless/anxious/empty?   EMMI follow up completed and will follow up to assess further care management needs.       Plan: RNCM will discus patient with multidisciplinary care team on 11/26/2019 and follow up on recommendations a needed.  RNCM will call patient for telephone outreach attempt, within 21 business days, EMMI follow up, to assess for further CM needs, and proceed with case closure, within 10 business days if no return call, after 3rd unsuccessful outreach call.     Franke Menter H. Annia Friendly, BSN, [place] Management [facility] Telephonic CM Phone: [phone] Fax: [phone]

## 2019-11-20 ENCOUNTER — Other Ambulatory Visit: Payer: Self-pay | Admitting: *Deleted

## 2019-11-20 ENCOUNTER — Other Ambulatory Visit: Payer: Self-pay | Admitting: Nurse Practitioner

## 2019-11-20 DIAGNOSIS — F322 Major depressive disorder, single episode, severe without psychotic features: Secondary | ICD-10-CM

## 2019-11-20 NOTE — Telephone Encounter (Signed)
Ok thank you 

## 2019-11-20 NOTE — Telephone Encounter (Signed)
2ND attempt. Unable to reach pt. Pt has virtual 4 wk follow up w/ PCP scheduled 11/27/19 @9am . I have put in appt note that this may need to be changed to a TCM/ hospital follow up.

## 2019-11-20 NOTE — Patient Outreach (Signed)
[place] [facility]) Care Management  11/20/2019  [name] Sep 02, 1945 [id number]   Received email update from [place], states this RNCM will continue to collaborate with Embedded Chronic Care Management Pharmacist as needed, follow patient for [facility] Discharge follow up, and any other care management needs as needed.     Aashrith Eves H. Annia Friendly, BSN, [place] Management [facility] Telephonic CM Phone: [phone] Fax: [phone]

## 2019-11-21 DIAGNOSIS — D631 Anemia in chronic kidney disease: Secondary | ICD-10-CM | POA: Diagnosis not present

## 2019-11-21 DIAGNOSIS — I0981 Rheumatic heart failure: Secondary | ICD-10-CM | POA: Diagnosis not present

## 2019-11-21 DIAGNOSIS — I081 Rheumatic disorders of both mitral and tricuspid valves: Secondary | ICD-10-CM | POA: Diagnosis not present

## 2019-11-21 DIAGNOSIS — I130 Hypertensive heart and chronic kidney disease with heart failure and stage 1 through stage 4 chronic kidney disease, or unspecified chronic kidney disease: Secondary | ICD-10-CM | POA: Diagnosis not present

## 2019-11-21 DIAGNOSIS — I5043 Acute on chronic combined systolic (congestive) and diastolic (congestive) heart failure: Secondary | ICD-10-CM | POA: Diagnosis not present

## 2019-11-21 DIAGNOSIS — N183 Chronic kidney disease, stage 3 unspecified: Secondary | ICD-10-CM | POA: Diagnosis not present

## 2019-11-23 ENCOUNTER — Ambulatory Visit: Payer: Medicare Other | Admitting: *Deleted

## 2019-11-23 ENCOUNTER — Telehealth: Payer: Self-pay | Admitting: Nurse Practitioner

## 2019-11-23 ENCOUNTER — Encounter: Payer: Self-pay | Admitting: *Deleted

## 2019-11-23 ENCOUNTER — Other Ambulatory Visit: Payer: Self-pay | Admitting: *Deleted

## 2019-11-23 DIAGNOSIS — I081 Rheumatic disorders of both mitral and tricuspid valves: Secondary | ICD-10-CM | POA: Diagnosis not present

## 2019-11-23 DIAGNOSIS — I130 Hypertensive heart and chronic kidney disease with heart failure and stage 1 through stage 4 chronic kidney disease, or unspecified chronic kidney disease: Secondary | ICD-10-CM | POA: Diagnosis not present

## 2019-11-23 DIAGNOSIS — D631 Anemia in chronic kidney disease: Secondary | ICD-10-CM | POA: Diagnosis not present

## 2019-11-23 DIAGNOSIS — N183 Chronic kidney disease, stage 3 unspecified: Secondary | ICD-10-CM | POA: Diagnosis not present

## 2019-11-23 DIAGNOSIS — I5043 Acute on chronic combined systolic (congestive) and diastolic (congestive) heart failure: Secondary | ICD-10-CM | POA: Diagnosis not present

## 2019-11-23 DIAGNOSIS — I0981 Rheumatic heart failure: Secondary | ICD-10-CM | POA: Diagnosis not present

## 2019-11-23 NOTE — Telephone Encounter (Signed)
[facility] is calling in for verbal orders -the frequency will be 1 week-1, 2 week-3, 1 week-3

## 2019-11-23 NOTE — Telephone Encounter (Signed)
ok 

## 2019-11-23 NOTE — Telephone Encounter (Signed)
[place] please advise.   [facility] is calling for verbal orders.

## 2019-11-23 NOTE — Telephone Encounter (Signed)
[facility] was given ok for physical therapy orders.

## 2019-11-23 NOTE — Patient Outreach (Signed)
[place] [facility]) Care Management  11/23/2019  EASTYN SKALLA 01/08/1946 [id number]   CSW made contact with pt who clarified with CSW that she has completed Advance Directive papers from "20 years ago and I want to redo them".  CSW offered to mail pt a packet and then follow up for further assist.  Pt agrees and appreciative.  CSW will plan to have packet mailed and call pt in the next 10 days.   Eduard Clos, MSW, [place] Worker  [place] [phone]

## 2019-11-24 DIAGNOSIS — I447 Left bundle-branch block, unspecified: Secondary | ICD-10-CM | POA: Diagnosis not present

## 2019-11-24 DIAGNOSIS — Z95818 Presence of other cardiac implants and grafts: Secondary | ICD-10-CM | POA: Diagnosis not present

## 2019-11-24 DIAGNOSIS — Z9889 Other specified postprocedural states: Secondary | ICD-10-CM | POA: Diagnosis not present

## 2019-11-24 DIAGNOSIS — Z7901 Long term (current) use of anticoagulants: Secondary | ICD-10-CM | POA: Diagnosis not present

## 2019-11-24 DIAGNOSIS — I4892 Unspecified atrial flutter: Secondary | ICD-10-CM | POA: Diagnosis not present

## 2019-11-24 DIAGNOSIS — I5030 Unspecified diastolic (congestive) heart failure: Secondary | ICD-10-CM | POA: Diagnosis not present

## 2019-11-24 DIAGNOSIS — I4821 Permanent atrial fibrillation: Secondary | ICD-10-CM | POA: Diagnosis not present

## 2019-11-24 DIAGNOSIS — Z940 Kidney transplant status: Secondary | ICD-10-CM | POA: Diagnosis not present

## 2019-11-24 DIAGNOSIS — Z7982 Long term (current) use of aspirin: Secondary | ICD-10-CM | POA: Diagnosis not present

## 2019-11-24 DIAGNOSIS — Z79899 Other long term (current) drug therapy: Secondary | ICD-10-CM | POA: Diagnosis not present

## 2019-11-24 DIAGNOSIS — I110 Hypertensive heart disease with heart failure: Secondary | ICD-10-CM | POA: Diagnosis not present

## 2019-11-24 DIAGNOSIS — I4819 Other persistent atrial fibrillation: Secondary | ICD-10-CM | POA: Diagnosis not present

## 2019-11-24 DIAGNOSIS — Z86718 Personal history of other venous thrombosis and embolism: Secondary | ICD-10-CM | POA: Diagnosis not present

## 2019-11-24 DIAGNOSIS — Z950 Presence of cardiac pacemaker: Secondary | ICD-10-CM | POA: Diagnosis not present

## 2019-11-24 DIAGNOSIS — J449 Chronic obstructive pulmonary disease, unspecified: Secondary | ICD-10-CM | POA: Diagnosis not present

## 2019-11-26 ENCOUNTER — Other Ambulatory Visit: Payer: Self-pay | Admitting: *Deleted

## 2019-11-26 DIAGNOSIS — I447 Left bundle-branch block, unspecified: Secondary | ICD-10-CM | POA: Diagnosis not present

## 2019-11-26 DIAGNOSIS — Z950 Presence of cardiac pacemaker: Secondary | ICD-10-CM | POA: Diagnosis not present

## 2019-11-26 NOTE — Patient Outreach (Signed)
[place] [facility]) Care Management  11/26/2019  TORIANNA JUNIO [birth year] [id number]   Patient's case discussed at  Multidisciplinary Case Discussion (MCD) with [facility] Care Management Team.  Discussion recommendations: RNCM will continue to follow up on [place] Discharge Red Alerts, follow up to assess CM needs, and coordinate care with Embedded Chronic Care Management Pharmacist as needed.  Plan: Update team as needed.     Carlis [place]sworth H. Annia Friendly, BSN, [place] Management [facility] Telephonic CM Phone: [phone] Fax: [phone]

## 2019-11-27 ENCOUNTER — Telehealth (INDEPENDENT_AMBULATORY_CARE_PROVIDER_SITE_OTHER): Payer: Medicare Other | Admitting: Nurse Practitioner

## 2019-11-27 ENCOUNTER — Other Ambulatory Visit: Payer: Self-pay

## 2019-11-27 ENCOUNTER — Encounter: Payer: Self-pay | Admitting: Nurse Practitioner

## 2019-11-27 ENCOUNTER — Other Ambulatory Visit: Payer: Self-pay | Admitting: Nurse Practitioner

## 2019-11-27 VITALS — BP 145/82 | HR 70 | Ht 65.0 in | Wt 141.0 lb

## 2019-11-27 DIAGNOSIS — I5032 Chronic diastolic (congestive) heart failure: Secondary | ICD-10-CM | POA: Diagnosis not present

## 2019-11-27 DIAGNOSIS — M109 Gout, unspecified: Secondary | ICD-10-CM | POA: Diagnosis not present

## 2019-11-27 DIAGNOSIS — I081 Rheumatic disorders of both mitral and tricuspid valves: Secondary | ICD-10-CM | POA: Diagnosis not present

## 2019-11-27 DIAGNOSIS — N189 Chronic kidney disease, unspecified: Secondary | ICD-10-CM | POA: Diagnosis not present

## 2019-11-27 DIAGNOSIS — I10 Essential (primary) hypertension: Secondary | ICD-10-CM | POA: Diagnosis not present

## 2019-11-27 DIAGNOSIS — I739 Peripheral vascular disease, unspecified: Secondary | ICD-10-CM | POA: Diagnosis not present

## 2019-11-27 DIAGNOSIS — G4733 Obstructive sleep apnea (adult) (pediatric): Secondary | ICD-10-CM | POA: Diagnosis not present

## 2019-11-27 DIAGNOSIS — I130 Hypertensive heart and chronic kidney disease with heart failure and stage 1 through stage 4 chronic kidney disease, or unspecified chronic kidney disease: Secondary | ICD-10-CM | POA: Diagnosis not present

## 2019-11-27 DIAGNOSIS — N179 Acute kidney failure, unspecified: Secondary | ICD-10-CM | POA: Diagnosis not present

## 2019-11-27 DIAGNOSIS — F329 Major depressive disorder, single episode, unspecified: Secondary | ICD-10-CM | POA: Diagnosis not present

## 2019-11-27 DIAGNOSIS — N185 Chronic kidney disease, stage 5: Secondary | ICD-10-CM | POA: Diagnosis not present

## 2019-11-27 DIAGNOSIS — Z9181 History of falling: Secondary | ICD-10-CM | POA: Diagnosis not present

## 2019-11-27 DIAGNOSIS — Z8719 Personal history of other diseases of the digestive system: Secondary | ICD-10-CM

## 2019-11-27 DIAGNOSIS — I4819 Other persistent atrial fibrillation: Secondary | ICD-10-CM | POA: Diagnosis not present

## 2019-11-27 DIAGNOSIS — Z8673 Personal history of transient ischemic attack (TIA), and cerebral infarction without residual deficits: Secondary | ICD-10-CM | POA: Diagnosis not present

## 2019-11-27 DIAGNOSIS — Z940 Kidney transplant status: Secondary | ICD-10-CM | POA: Diagnosis not present

## 2019-11-27 DIAGNOSIS — D500 Iron deficiency anemia secondary to blood loss (chronic): Secondary | ICD-10-CM | POA: Diagnosis not present

## 2019-11-27 DIAGNOSIS — N183 Chronic kidney disease, stage 3 unspecified: Secondary | ICD-10-CM | POA: Diagnosis not present

## 2019-11-27 DIAGNOSIS — D631 Anemia in chronic kidney disease: Secondary | ICD-10-CM

## 2019-11-27 DIAGNOSIS — Z86711 Personal history of pulmonary embolism: Secondary | ICD-10-CM | POA: Diagnosis not present

## 2019-11-27 DIAGNOSIS — I0981 Rheumatic heart failure: Secondary | ICD-10-CM | POA: Diagnosis not present

## 2019-11-27 DIAGNOSIS — Z7952 Long term (current) use of systemic steroids: Secondary | ICD-10-CM | POA: Diagnosis not present

## 2019-11-27 DIAGNOSIS — N2581 Secondary hyperparathyroidism of renal origin: Secondary | ICD-10-CM | POA: Diagnosis not present

## 2019-11-27 DIAGNOSIS — F419 Anxiety disorder, unspecified: Secondary | ICD-10-CM | POA: Diagnosis not present

## 2019-11-27 DIAGNOSIS — E213 Hyperparathyroidism, unspecified: Secondary | ICD-10-CM | POA: Diagnosis not present

## 2019-11-27 DIAGNOSIS — K5731 Diverticulosis of large intestine without perforation or abscess with bleeding: Secondary | ICD-10-CM | POA: Diagnosis not present

## 2019-11-27 DIAGNOSIS — F322 Major depressive disorder, single episode, severe without psychotic features: Secondary | ICD-10-CM | POA: Diagnosis not present

## 2019-11-27 DIAGNOSIS — L409 Psoriasis, unspecified: Secondary | ICD-10-CM | POA: Diagnosis not present

## 2019-11-27 DIAGNOSIS — I420 Dilated cardiomyopathy: Secondary | ICD-10-CM | POA: Diagnosis not present

## 2019-11-27 DIAGNOSIS — N184 Chronic kidney disease, stage 4 (severe): Secondary | ICD-10-CM | POA: Diagnosis not present

## 2019-11-27 DIAGNOSIS — J45909 Unspecified asthma, uncomplicated: Secondary | ICD-10-CM | POA: Diagnosis not present

## 2019-11-27 NOTE — Patient Instructions (Signed)
Proceed with home PT and OT Maintain upcoming appt with Dr. Collene Mares on 12/01/2019 Go [facility] blood draw as discussed Continue current medications F/up in 60month

## 2019-11-27 NOTE — Progress Notes (Signed)
Virtual Visit via Video Note  I connected with@ on 11/27/19 at  9:00 AM EDT by a video enabled telemedicine application and verified that I am speaking with the correct person using two identifiers.  Location: Patient:Home Provider: Office Participants: patient and provider  I discussed the limitations of evaluation and management by telemedicine and the availability of in person appointments. I also discussed with the patient that there may be a patient responsible charge related to this service. The patient expressed understanding and agreed to proceed.  CC:4 week follow up on depression-pt states she still feels depressed at time and doesn't know whats wrong with her//pt also was in ER fluid around heart and lungs she states shes breathing better//  History of Present Illness: Hospital stay: 05/01-05/05 Denies any GI bleed since discharge, has upcoming appt with Dr. Collene Mares 12/01/2019, no hypotension. Has appt for blood draw today at [place]. She will prefer to have labs drawn there. Schedule to start home OT and PT sessions next week. No change in mood, waxing and waning, reports she feels pressured by her husband and children to be more active. Current use of cymbalta 30mg  for last 4weeks, no SI or hallucinations. Has ongoing sessions with therapist.   Observations/Objective: Physical Exam  Constitutional: She is oriented to person, place, and time. No distress.  Pulmonary/Chest: Effort normal.  Neurological: She is alert and oriented to person, place, and time.  Psychiatric: She has a normal mood and affect. Thought content normal.  Vitals reviewed.  Assessment and Plan: [place] was seen today for follow-up.  Diagnoses and all orders for this visit:  Current severe episode of major depressive disorder without psychotic features without prior episode ([place]) -     TSH; Future  Essential hypertension -     Basic metabolic panel; Future  Acute kidney injury superimposed on chronic  kidney disease ([place]) -     Basic metabolic panel; Future  Anemia due to stage 5 chronic kidney disease, not on chronic dialysis (HCC) -     CBC w/Diff; Future   Follow Up Instructions:  Proceed with home PT and OT Maintain upcoming appt with Dr. Collene Mares on 12/01/2019 [facility] draw as discussed Continue current medications F/up in 80month   I discussed the assessment and treatment plan with the patient. The patient was provided an opportunity to ask questions and all were answered. The patient agreed with the plan and demonstrated an understanding of the instructions.   The patient was advised to call back or seek an in-person evaluation if the symptoms worsen or if the condition fails to improve as anticipated.   Wilfred Lacy, NP

## 2019-11-28 LAB — BASIC METABOLIC PANEL
BUN/Creatinine Ratio: 9 — ABNORMAL LOW (ref 12–28)
BUN: 21 mg/dL (ref 8–27)
CO2: 30 mmol/L — ABNORMAL HIGH (ref 20–29)
Calcium: 9.7 mg/dL (ref 8.7–10.3)
Chloride: 99 mmol/L (ref 96–106)
Creatinine, Ser: 2.26 mg/dL — ABNORMAL HIGH (ref 0.57–1.00)
GFR calc Af Amer: 24 mL/min/{1.73_m2} — ABNORMAL LOW (ref >59)
GFR calc non Af Amer: 21 mL/min/{1.73_m2} — ABNORMAL LOW (ref >59)
Glucose: 114 mg/dL — ABNORMAL HIGH (ref 65–99)
Potassium: 4.8 mmol/L (ref 3.5–5.2)
Sodium: 141 mmol/L (ref 134–144)

## 2019-11-28 LAB — CBC WITH DIFFERENTIAL/PLATELET
Basophils Absolute: 0.1 10*3/uL (ref 0.0–0.2)
Basos: 1 %
EOS (ABSOLUTE): 0 10*3/uL (ref 0.0–0.4)
Eos: 0 %
Hematocrit: 33.8 % — ABNORMAL LOW (ref 34.0–46.6)
Hemoglobin: 10.8 g/dL — ABNORMAL LOW (ref 11.1–15.9)
Immature Grans (Abs): 0 10*3/uL (ref 0.0–0.1)
Immature Granulocytes: 1 %
Lymphocytes Absolute: 0.6 10*3/uL — ABNORMAL LOW (ref 0.7–3.1)
Lymphs: 8 %
MCH: 28.1 pg (ref 26.6–33.0)
MCHC: 32 g/dL (ref 31.5–35.7)
MCV: 88 fL (ref 79–97)
Monocytes Absolute: 0.5 10*3/uL (ref 0.1–0.9)
Monocytes: 6 %
Neutrophils Absolute: 7 10*3/uL (ref 1.4–7.0)
Neutrophils: 84 %
Platelets: 284 10*3/uL (ref 150–450)
RBC: 3.84 x10E6/uL (ref 3.77–5.28)
RDW: 15.1 % (ref 11.7–15.4)
WBC: 8.3 10*3/uL (ref 3.4–10.8)

## 2019-11-28 LAB — TSH: TSH: 1.45 u[IU]/mL (ref 0.450–4.500)

## 2019-11-30 ENCOUNTER — Ambulatory Visit (INDEPENDENT_AMBULATORY_CARE_PROVIDER_SITE_OTHER): Payer: Medicare Other | Admitting: Psychology

## 2019-11-30 ENCOUNTER — Ambulatory Visit: Payer: Self-pay | Admitting: *Deleted

## 2019-11-30 ENCOUNTER — Other Ambulatory Visit: Payer: Self-pay | Admitting: *Deleted

## 2019-11-30 ENCOUNTER — Encounter: Payer: Self-pay | Admitting: *Deleted

## 2019-11-30 DIAGNOSIS — D500 Iron deficiency anemia secondary to blood loss (chronic): Secondary | ICD-10-CM | POA: Diagnosis not present

## 2019-11-30 DIAGNOSIS — N183 Chronic kidney disease, stage 3 unspecified: Secondary | ICD-10-CM | POA: Diagnosis not present

## 2019-11-30 DIAGNOSIS — I5032 Chronic diastolic (congestive) heart failure: Secondary | ICD-10-CM | POA: Diagnosis not present

## 2019-11-30 DIAGNOSIS — I130 Hypertensive heart and chronic kidney disease with heart failure and stage 1 through stage 4 chronic kidney disease, or unspecified chronic kidney disease: Secondary | ICD-10-CM | POA: Diagnosis not present

## 2019-11-30 DIAGNOSIS — F4322 Adjustment disorder with anxiety: Secondary | ICD-10-CM | POA: Diagnosis not present

## 2019-11-30 DIAGNOSIS — K5731 Diverticulosis of large intestine without perforation or abscess with bleeding: Secondary | ICD-10-CM | POA: Diagnosis not present

## 2019-11-30 DIAGNOSIS — I0981 Rheumatic heart failure: Secondary | ICD-10-CM | POA: Diagnosis not present

## 2019-11-30 NOTE — Patient Outreach (Signed)
[facility]) Care Management  11/30/2019  SEFORA TIETJE Nov 14, 1945 [id number]   CSW attempted to reach pt today for follow up and was unable to reach pt. CSW left a HIPPA compliant voice message and will plan to call again in 3-4 business days if no return call is received.   Eduard Clos, MSW, [place] Worker  [place] [phone]

## 2019-12-01 DIAGNOSIS — K5730 Diverticulosis of large intestine without perforation or abscess without bleeding: Secondary | ICD-10-CM | POA: Diagnosis not present

## 2019-12-01 DIAGNOSIS — D509 Iron deficiency anemia, unspecified: Secondary | ICD-10-CM | POA: Diagnosis not present

## 2019-12-01 DIAGNOSIS — K219 Gastro-esophageal reflux disease without esophagitis: Secondary | ICD-10-CM | POA: Diagnosis not present

## 2019-12-01 DIAGNOSIS — K5900 Constipation, unspecified: Secondary | ICD-10-CM | POA: Diagnosis not present

## 2019-12-02 ENCOUNTER — Ambulatory Visit: Payer: Medicare Other | Admitting: Pharmacist

## 2019-12-02 ENCOUNTER — Other Ambulatory Visit: Payer: Self-pay

## 2019-12-02 DIAGNOSIS — I130 Hypertensive heart and chronic kidney disease with heart failure and stage 1 through stage 4 chronic kidney disease, or unspecified chronic kidney disease: Secondary | ICD-10-CM | POA: Diagnosis not present

## 2019-12-02 DIAGNOSIS — K5731 Diverticulosis of large intestine without perforation or abscess with bleeding: Secondary | ICD-10-CM | POA: Diagnosis not present

## 2019-12-02 DIAGNOSIS — N183 Chronic kidney disease, stage 3 unspecified: Secondary | ICD-10-CM | POA: Diagnosis not present

## 2019-12-02 DIAGNOSIS — D500 Iron deficiency anemia secondary to blood loss (chronic): Secondary | ICD-10-CM | POA: Diagnosis not present

## 2019-12-02 DIAGNOSIS — J4541 Moderate persistent asthma with (acute) exacerbation: Secondary | ICD-10-CM

## 2019-12-02 DIAGNOSIS — I5032 Chronic diastolic (congestive) heart failure: Secondary | ICD-10-CM | POA: Diagnosis not present

## 2019-12-02 DIAGNOSIS — I0981 Rheumatic heart failure: Secondary | ICD-10-CM | POA: Diagnosis not present

## 2019-12-02 MED ORDER — BUDESONIDE-FORMOTEROL FUMARATE 160-4.5 MCG/ACT IN AERO: 2.0000 | INHALATION_SPRAY | Freq: Two times a day (BID) | RESPIRATORY_TRACT | 5 refills | Status: DC

## 2019-12-02 NOTE — Patient Instructions (Signed)
Thank you for meeting with the pharmacy team today.  Below is a recap of what we discussed:  STOP Breo Ellipta  START Symbicort 2 puffs twice daily and rinse mouth after use  Call us at the office with any questions or concerns

## 2019-12-02 NOTE — Progress Notes (Signed)
HPI Patient presents today to  Pulmonary to see pharmacy team for medication reconcilation and inhaler education. She was accompanied by her husband. Past medical history includes asthma, sinusitis, afib, HTN, ESRD.  Patient states she does not think she is using Breo Ellipta correctly as it does not control her symptoms as well as Symbicort.  Respiratory Medications Current: Breo Ellipta Tried in past: Symbicort (d/c a month ago due to cost), QVAR (patient denies using), Flovent (patient denies using) Patient reports adherence challenges  OBJECTIVE Allergies  Allergen Reactions  . Ace Inhibitors Other (See Comments)    Reaction unknown  . 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 Warfarin Other (See Comments)    Left side brain hemorrhage  . Warfarin Sodium Other (See Comments)    Caused her to have a stroke    Outpatient Encounter Medications as of 12/02/2019  Medication Sig Note  . acetaminophen (TYLENOL) 500 MG tablet Take 500 mg by mouth every 6 (six) hours as needed for mild pain or headache.    . albuterol (PROVENTIL) (2.5 MG/3ML) 0.083% nebulizer solution Take 3 mLs (2.5 mg total) by nebulization every 6 (six) hours as needed for wheezing or shortness of breath. Dx:J45.909   . carvedilol (COREG) 12.5 MG tablet Take 2 tablets (25 mg total) by mouth 2 (two) times daily.   Marland Kitchen docusate sodium (COLACE) 100 MG capsule Take 100 mg by mouth daily as needed (For constipation.).    Marland Kitchen DULoxetine (CYMBALTA) 30 MG capsule TAKE 1 CAPSULE BY MOUTH EVERY DAY   . Ferrous Sulfate (IRON PO) Take 325 mg by mouth daily.    . fluticasone furoate-vilanterol (BREO ELLIPTA) 100-25 MCG/INH AEPB Inhale 1 puff into the lungs daily.   . furosemide (LASIX) 80 MG tablet Take 1 tablet (80 mg total) by mouth 2 (two) times daily. 11/05/2019: States she is taking 2  80 mg tablets twice a day and will call kidney MD to verify.   . magnesium  oxide (MAG-OX) 400 MG tablet Take 2 tablets by mouth 2 (two) times daily.   . mycophenolate (MYFORTIC) 180 MG EC tablet Take 180 mg by mouth 2 (two) times daily.   . ondansetron (ZOFRAN) 4 MG tablet Take 1 tablet (4 mg total) by mouth every 6 (six) hours as needed for nausea.   . pantoprazole (PROTONIX) 40 MG tablet Take 1 tablet (40 mg total) by mouth daily.   . potassium chloride SA (K-DUR,KLOR-CON) 20 MEQ tablet Take 20 mEq by mouth daily.    . predniSONE (DELTASONE) 5 MG tablet Take 1 tablet (5 mg total) by mouth daily with breakfast. Please resume after you have completed prednisone taper   . tacrolimus (PROGRAF) 1 MG capsule Take 2 mg by mouth 2 (two) times daily.     No facility-administered encounter medications on file as of 12/02/2019.     Immunization History  Administered Date(s) Administered  . Fluad Quad(high Dose 65+) 04/28/2019  . Influenza Split 03/27/2012  . Influenza Whole 04/29/2008  . Influenza, High Dose Seasonal PF 04/19/2017, 03/16/2018  . Influenza,inj,Quad PF,6+ Mos 04/24/2013  . Influenza-Unspecified 04/29/2014, 03/17/2015, 04/18/2016  . PFIZER SARS-COV-2 Vaccination 09/09/2019, 09/23/2019  . Pneumococcal Conjugate-13 03/02/2014  . Pneumococcal Polysaccharide-23 04/25/2005, 09/18/2016  . Tdap 05/08/2013     PFTs PFT Results Latest Ref Rng & Units 12/02/2017  FVC-Pre L 1.33  FVC-Predicted Pre % 53  FVC-Post L 1.27  FVC-Predicted Post % 50  Pre FEV1/FVC % % 77  Post FEV1/FCV % % 79  FEV1-Pre L 1.02  FEV1-Predicted Pre % 52  FEV1-Post L 1.00  DLCO UNC% % 43  DLCO COR %Predicted % 83  TLC L 2.89  TLC % Predicted % 55  RV % Predicted % 73      Assessment   1. Inhaler Optimization  Patient doesn't think she is using Breo Ellipta correctly.  She feels like it is not controlling her breathing as well as inhalers that she has used in the past.  She was switched from Symbicort in the new year.  Symbicort had a high co-pay due to a deductible and was  given a list of alternative medications and pricing.  Breo Ellipta was the same price as Symbicort and once daily dosing so she decided to try [employer].  Patient requested to switch back to Symbicort. Ran test claim and Symbicort was covered through insurance with a $47 co-pay.  Patient was counseled on the purpose, proper use, and adverse effects of Symbicort inhaler.  Instructed patient to rinse mouth with water after using in order to prevent fungal infection.  Patient verbalized understanding.  Reviewed appropriate use of maintenance vs rescue inhalers.  Stressed importance of using maintenance inhaler daily and rescue inhaler only as needed.  Patient verbalized understanding.  Demonstrated proper inhaler technique using Symbicort demo inhaler.  Patient able to demonstrate proper inhaler technique using teach back method.   Prescription sent to [facility] on [address] per patient request for Symbicort 160/4.5 mcg 2 puffs twice daily.  2. Medication Reconciliation  A drug regimen assessment was performed, including review of allergies, interactions, disease-state management, dosing and immunization history. Medications were reviewed with the patient, including name, instructions, indication, goals of therapy, potential side effects, importance of adherence, and safe use.  3. Immunizations  Patient is up to date with annual influenza, pneumonia and Covid-19.  PLAN  Stop Breo Ellipta  Start Symbicort 160 mcg 2 puffs twice daily.  All questions encouraged and answered.  Instructed patient to reach out with any further questions or concerns.  Thank you for allowing pharmacy to participate in this patient's care.  This appointment required 30 minutes of patient care (this includes precharting, chart review, review of results, face-to-face care, etc.).   Mariella Saa, PharmD, [place], [place] Clinical Specialty Pharmacist [phone]  12/02/2019 4:49 PM

## 2019-12-03 ENCOUNTER — Telehealth: Payer: Self-pay

## 2019-12-03 ENCOUNTER — Ambulatory Visit (INDEPENDENT_AMBULATORY_CARE_PROVIDER_SITE_OTHER): Payer: Medicare Other | Admitting: Pulmonary Disease

## 2019-12-03 ENCOUNTER — Encounter: Payer: Self-pay | Admitting: Pulmonary Disease

## 2019-12-03 DIAGNOSIS — J4540 Moderate persistent asthma, uncomplicated: Secondary | ICD-10-CM | POA: Diagnosis not present

## 2019-12-03 DIAGNOSIS — I0981 Rheumatic heart failure: Secondary | ICD-10-CM | POA: Diagnosis not present

## 2019-12-03 DIAGNOSIS — N183 Chronic kidney disease, stage 3 unspecified: Secondary | ICD-10-CM | POA: Diagnosis not present

## 2019-12-03 DIAGNOSIS — I2729 Other secondary pulmonary hypertension: Secondary | ICD-10-CM

## 2019-12-03 DIAGNOSIS — I5032 Chronic diastolic (congestive) heart failure: Secondary | ICD-10-CM | POA: Diagnosis not present

## 2019-12-03 DIAGNOSIS — I420 Dilated cardiomyopathy: Secondary | ICD-10-CM

## 2019-12-03 DIAGNOSIS — I130 Hypertensive heart and chronic kidney disease with heart failure and stage 1 through stage 4 chronic kidney disease, or unspecified chronic kidney disease: Secondary | ICD-10-CM | POA: Diagnosis not present

## 2019-12-03 DIAGNOSIS — D500 Iron deficiency anemia secondary to blood loss (chronic): Secondary | ICD-10-CM | POA: Diagnosis not present

## 2019-12-03 DIAGNOSIS — K5731 Diverticulosis of large intestine without perforation or abscess with bleeding: Secondary | ICD-10-CM | POA: Diagnosis not present

## 2019-12-03 NOTE — Progress Notes (Signed)
   Subjective:    Patient ID: [name], female    DOB: 24-Jun-1946, 74 y.o.   MRN: [id number]  HPI  74 yo never smokerfor FUof chronic cough  & 'asthma'+ and amiodarone induced ILD  PMH - She is a renal transplant recipientin 07/2015, end-stage renal disease on dialysis since 2013, history of ICH in 2010 on warfarin and history of DVT/PE status post IVC filter.  HFpEF and atrial fibrillation status post ablation in 2017. She was evaluated by her PCP in October 2018 and told that she may have asthma. She reports asthma as a child but this never bothered her as an adult.   01/2018 AKI due to supratherapeutic tacrolimus, required transient CRRT at [facility]  03/2018 underwent  permanent pacemaker implant and AV node ablation , amio was stopped  Hospital visit was reviewed, she was hospitalized initially for acute diastolic heart failure and fluid retention, echo showed normal EF and pulmonary hypertension.  She was then rehospitalized for better blood per rectum.  She is now on home PT and is recovering well is off oxygen. Chest x-ray 4/14 was reviewed which shows mild interstitial prominence  HRCT 12/2018 reviewed  Significant tests/ events reviewed  Spirometry3/2019significant restriction with FVC 25%, FEV1 28%. Ratio 87. Decreased mid flows.  PFTs 11/2017-moderate restriction, FVC 53%, TLC 55%, DLCO 43%, no obstruction  01/12/2019-CT chest high-res-assessment for ILD is limited by respiratory motion, no definitive evidence of fibrotic interstitial lung disease, air trapping indicative of small airways disease, aortic arthrosclerosis, enlarged pulmonary arteries   10/23/2019-echocardiogram-LV ejection fraction 55 to 60%, D-shaped left ventricle consistent with right ventricular volume overload, right ventricle systolic function is low normal, moderately elevated pulmonary artery systolic pressure, estimated right ventricle systolic pressure is 69.7, left atrial size was  severely dilated, right atrial size was severely dilated  HRCT5/2019-mild patchy subpleural reticulation and groundglass bilateral lungs without significant traction bronchiectasis or frank honeycombing. Findings nonspecific may represent mild postinfectious/inflammatory changes.   Review of Systems Patient denies significant dyspnea,cough, hemoptysis,  chest pain, palpitations, pedal edema, orthopnea, paroxysmal nocturnal dyspnea, lightheadedness, nausea, vomiting, abdominal or  leg pains      Objective:   Physical Exam   Gen. Pleasant, well-nourished, in no distress ENT - no thrush, no pallor/icterus,no post nasal drip Neck: No JVD, no thyromegaly, no carotid bruits Lungs: no use of accessory muscles, no dullness to percussion, clear without rales or rhonchi  Cardiovascular: Rhythm regular, s1 nml, loud P2, no murmurs or gallops, no peripheral edema Musculoskeletal: No deformities, no cyanosis or clubbing         Assessment & Plan:

## 2019-12-03 NOTE — Patient Instructions (Signed)
Continue on Symbicort 2 puffs twice daily, rinse mouth after use. Use albuterol as needed for wheezing   We will try to make the appointment with heart failure clinic call us in 2 weeks if they have not called with an appointment.  We discussed weighing yourself 3 times a week to get ahead of the fluid situation

## 2019-12-03 NOTE — Assessment & Plan Note (Signed)
Not convinced this is true asthma , she does not seem to have significant ILD Feel that this is more related to pulmonary hypertension/diastolic heart failure but given her recommendations we will keep her on steroid/LABA combination at this time   Continue on Symbicort 2 puffs twice daily, rinse mouth after use. Use albuterol as needed for wheezing

## 2019-12-03 NOTE — Telephone Encounter (Signed)
Per note in referral Kevan Rosebush closed it.  I will send her staff message to ask why.

## 2019-12-03 NOTE — Telephone Encounter (Signed)
PCC's can you check on why the referral for the CHF clinic was closed? Dr. Elsworth Soho wants her to be set up with them. Please advise.

## 2019-12-03 NOTE — Assessment & Plan Note (Signed)
Referral to CHF clinic, she feels that [facility] cardiology has not been very responsive to her needs and would like to establish locally I have asked her to weigh herself 3 times a week, continue Lasix 80 twice daily and additional dose of Lasix if weight goes up by 3 pounds

## 2019-12-03 NOTE — Assessment & Plan Note (Signed)
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 No oxygen requirement currently Continue Lasix

## 2019-12-04 ENCOUNTER — Ambulatory Visit: Payer: Self-pay | Admitting: *Deleted

## 2019-12-07 ENCOUNTER — Ambulatory Visit: Payer: Self-pay | Admitting: *Deleted

## 2019-12-07 DIAGNOSIS — N183 Chronic kidney disease, stage 3 unspecified: Secondary | ICD-10-CM | POA: Diagnosis not present

## 2019-12-07 DIAGNOSIS — I5032 Chronic diastolic (congestive) heart failure: Secondary | ICD-10-CM | POA: Diagnosis not present

## 2019-12-07 DIAGNOSIS — D500 Iron deficiency anemia secondary to blood loss (chronic): Secondary | ICD-10-CM | POA: Diagnosis not present

## 2019-12-07 DIAGNOSIS — I130 Hypertensive heart and chronic kidney disease with heart failure and stage 1 through stage 4 chronic kidney disease, or unspecified chronic kidney disease: Secondary | ICD-10-CM | POA: Diagnosis not present

## 2019-12-07 DIAGNOSIS — K5731 Diverticulosis of large intestine without perforation or abscess with bleeding: Secondary | ICD-10-CM | POA: Diagnosis not present

## 2019-12-07 DIAGNOSIS — I0981 Rheumatic heart failure: Secondary | ICD-10-CM | POA: Diagnosis not present

## 2019-12-07 NOTE — Telephone Encounter (Signed)
LMAM for Nira Conn Schubb to call back

## 2019-12-08 ENCOUNTER — Ambulatory Visit: Payer: Self-pay | Admitting: *Deleted

## 2019-12-09 ENCOUNTER — Other Ambulatory Visit: Payer: Self-pay | Admitting: *Deleted

## 2019-12-09 DIAGNOSIS — N183 Chronic kidney disease, stage 3 unspecified: Secondary | ICD-10-CM | POA: Diagnosis not present

## 2019-12-09 DIAGNOSIS — I0981 Rheumatic heart failure: Secondary | ICD-10-CM | POA: Diagnosis not present

## 2019-12-09 DIAGNOSIS — D500 Iron deficiency anemia secondary to blood loss (chronic): Secondary | ICD-10-CM | POA: Diagnosis not present

## 2019-12-09 DIAGNOSIS — I130 Hypertensive heart and chronic kidney disease with heart failure and stage 1 through stage 4 chronic kidney disease, or unspecified chronic kidney disease: Secondary | ICD-10-CM | POA: Diagnosis not present

## 2019-12-09 DIAGNOSIS — I5032 Chronic diastolic (congestive) heart failure: Secondary | ICD-10-CM | POA: Diagnosis not present

## 2019-12-09 DIAGNOSIS — K5731 Diverticulosis of large intestine without perforation or abscess with bleeding: Secondary | ICD-10-CM | POA: Diagnosis not present

## 2019-12-09 NOTE — Telephone Encounter (Signed)
I have placed another message to heathe in chf clinic to see what is going on with this referral [name]

## 2019-12-09 NOTE — Patient Outreach (Signed)
[place] [facility]) Care Management  12/09/2019  [name] 1946-01-23 [id number]   Subjective: Telephone call to patient's home / mobile number, spoke with patient, and HIPAA verified.   Patient states she is doing well, appetite increasing, has had a very good day, very active / productive day, had home health physical therapy, and occupational therapy, both visits went well.   States she cooked dinner for her daughter and family.  States she and her husband baby sat  their newest twin grand babies.   Patient states she is pushing self, encouraging self to do more activities of daily living, more household management, paces self as needed, building endurance, and is feeling more energized overall.  States she has been taking Lasix( 2  80mg  tablets twice a day) as prescribed by nephrologist, is aware it is different than what is listed in [place], has not had a chance to clarify with this MD to date,  and is planning to discuss it during her follow up appointment with MD on 12/16/2019.  Patient states 12/01/2019 gastroenterologist  appointment went well, endoscopy, colonoscopy performed during patient's last hospitalization, will monitor stools for signs of bleeding, follow up with MD as needed, and no return visit scheduled.  States per MD intestines looked good, may have had some age related changes, and no signs of bleeding at this time.   States she had a follow up appointment with primary MD on 11/27/2019 and appointment went well.  States her 12/03/2019 pulmonologist appointment went well, asthma is stable, and MD is referring her to [facility] Congestive [facility] Failure clinic, and office will call with appointment specifics.  RNCM advised case discussed with Embedded Chronic Care Management Lead, pharmacist in providers office will continue to follow patient for medication management, and this RNCM will follow for hypertension disease monitoring/ education, patient voices  understanding, and is in agreement. Patient states she does not have any education material, EMMI follow up, transportation, [employer], or pharmacy needs at this time. States she is very appreciative of the follow up, aware 30 day EMMI automated calls are completed, and is in agreement  to receive [facility] Care Management, and Embedded Chronic Care Management services.       Objective:Per KPN (Knowledge Performance Now, point of care tool) and chart review, patient admitted 11/14/2019 - 11/18/2019 for lower GI bleed with acute blood loss anemia. Patient hospitalized 10/22/2019 - 10/25/2019 for Acute respiratory failure with hypoxia,Acute on chronic combined systolic and diastoliccongestive heart failure. Patient also has a history of Acute bronchitis with asthma exacerbation,ChronicKidneyDiseasestage III with renal transplant,Immunosuppression, stroke,Persistent atrial fibrillation, hypertension, anemia of chronic disease.     Assessment:Received Medicare EMMI General Discharge Red Flag Alert follow up referral on 11/02/2019. Red Flag Alert Triggers, Day #4, times 2, patient answered yes to the following questions: Lost interest in things?Sad/hopeless/anxious/empty?EMMI follow up completed and will follow up to complete hypertension assessment.      Plan:RNCM will call patient for telephone outreach attempt, within 14 business days, hypertension assessment, and proceed with case closure, within 10 business days if no return call, after 4th unsuccessful outreach call.     Darlene H. Annia Friendly, BSN, [place] Management [facility] Telephonic CM Phone: [phone] Fax: [phone]

## 2019-12-15 ENCOUNTER — Ambulatory Visit: Payer: Medicare Other | Admitting: Psychology

## 2019-12-15 DIAGNOSIS — K5731 Diverticulosis of large intestine without perforation or abscess with bleeding: Secondary | ICD-10-CM | POA: Diagnosis not present

## 2019-12-15 DIAGNOSIS — N183 Chronic kidney disease, stage 3 unspecified: Secondary | ICD-10-CM | POA: Diagnosis not present

## 2019-12-15 DIAGNOSIS — I081 Rheumatic disorders of both mitral and tricuspid valves: Secondary | ICD-10-CM | POA: Diagnosis not present

## 2019-12-15 DIAGNOSIS — R0600 Dyspnea, unspecified: Secondary | ICD-10-CM | POA: Diagnosis not present

## 2019-12-15 DIAGNOSIS — I5032 Chronic diastolic (congestive) heart failure: Secondary | ICD-10-CM | POA: Diagnosis not present

## 2019-12-15 DIAGNOSIS — I130 Hypertensive heart and chronic kidney disease with heart failure and stage 1 through stage 4 chronic kidney disease, or unspecified chronic kidney disease: Secondary | ICD-10-CM | POA: Diagnosis not present

## 2019-12-15 DIAGNOSIS — D500 Iron deficiency anemia secondary to blood loss (chronic): Secondary | ICD-10-CM | POA: Diagnosis not present

## 2019-12-15 DIAGNOSIS — I0981 Rheumatic heart failure: Secondary | ICD-10-CM | POA: Diagnosis not present

## 2019-12-16 ENCOUNTER — Telehealth: Payer: Self-pay | Admitting: Nurse Practitioner

## 2019-12-16 ENCOUNTER — Telehealth (HOSPITAL_COMMUNITY): Payer: Self-pay | Admitting: Vascular Surgery

## 2019-12-16 ENCOUNTER — Telehealth (HOSPITAL_COMMUNITY): Payer: Self-pay | Admitting: *Deleted

## 2019-12-16 DIAGNOSIS — R809 Proteinuria, unspecified: Secondary | ICD-10-CM | POA: Diagnosis not present

## 2019-12-16 DIAGNOSIS — D849 Immunodeficiency, unspecified: Secondary | ICD-10-CM | POA: Diagnosis not present

## 2019-12-16 DIAGNOSIS — I770 Arteriovenous fistula, acquired: Secondary | ICD-10-CM | POA: Diagnosis not present

## 2019-12-16 DIAGNOSIS — K5731 Diverticulosis of large intestine without perforation or abscess with bleeding: Secondary | ICD-10-CM | POA: Diagnosis not present

## 2019-12-16 DIAGNOSIS — D631 Anemia in chronic kidney disease: Secondary | ICD-10-CM | POA: Diagnosis not present

## 2019-12-16 DIAGNOSIS — I0981 Rheumatic heart failure: Secondary | ICD-10-CM | POA: Diagnosis not present

## 2019-12-16 DIAGNOSIS — I5032 Chronic diastolic (congestive) heart failure: Secondary | ICD-10-CM | POA: Diagnosis not present

## 2019-12-16 DIAGNOSIS — N2581 Secondary hyperparathyroidism of renal origin: Secondary | ICD-10-CM | POA: Diagnosis not present

## 2019-12-16 DIAGNOSIS — N184 Chronic kidney disease, stage 4 (severe): Secondary | ICD-10-CM | POA: Diagnosis not present

## 2019-12-16 DIAGNOSIS — Z940 Kidney transplant status: Secondary | ICD-10-CM | POA: Diagnosis not present

## 2019-12-16 DIAGNOSIS — I130 Hypertensive heart and chronic kidney disease with heart failure and stage 1 through stage 4 chronic kidney disease, or unspecified chronic kidney disease: Secondary | ICD-10-CM | POA: Diagnosis not present

## 2019-12-16 DIAGNOSIS — N183 Chronic kidney disease, stage 3 unspecified: Secondary | ICD-10-CM | POA: Diagnosis not present

## 2019-12-16 DIAGNOSIS — I129 Hypertensive chronic kidney disease with stage 1 through stage 4 chronic kidney disease, or unspecified chronic kidney disease: Secondary | ICD-10-CM | POA: Diagnosis not present

## 2019-12-16 DIAGNOSIS — N189 Chronic kidney disease, unspecified: Secondary | ICD-10-CM | POA: Diagnosis not present

## 2019-12-16 DIAGNOSIS — Z79899 Other long term (current) drug therapy: Secondary | ICD-10-CM | POA: Diagnosis not present

## 2019-12-16 DIAGNOSIS — N2589 Other disorders resulting from impaired renal tubular function: Secondary | ICD-10-CM | POA: Diagnosis not present

## 2019-12-16 DIAGNOSIS — M109 Gout, unspecified: Secondary | ICD-10-CM | POA: Diagnosis not present

## 2019-12-16 DIAGNOSIS — D500 Iron deficiency anemia secondary to blood loss (chronic): Secondary | ICD-10-CM | POA: Diagnosis not present

## 2019-12-16 LAB — BASIC METABOLIC PANEL
BUN: 23 — AB (ref 4–21)
CO2: 30 — AB (ref 13–22)
Chloride: 101 (ref 99–108)
Glucose: 101
Potassium: 3.7 (ref 3.4–5.3)
Sodium: 140 (ref 137–147)

## 2019-12-16 LAB — CBC AND DIFFERENTIAL
HCT: 32 — AB (ref 36–46)
Hemoglobin: 10.7 — AB (ref 12.0–16.0)
WBC: 7.6

## 2019-12-16 LAB — CBC: RBC: 3.98 (ref 3.87–5.11)

## 2019-12-16 LAB — COMPREHENSIVE METABOLIC PANEL
Albumin: 4.1 (ref 3.5–5.0)
Calcium: 9.9 (ref 8.7–10.7)
GFR calc Af Amer: 27
GFR calc non Af Amer: 23
Globulin: 2.2

## 2019-12-16 LAB — HEPATIC FUNCTION PANEL
ALT: 9 (ref 7–35)
AST: 15 (ref 13–35)
Alkaline Phosphatase: 121 (ref 25–125)
Bilirubin, Total: 0.6

## 2019-12-16 NOTE — Telephone Encounter (Signed)
Left pt message giving pt new chf appt w/ db 7/22 @ 11:20, asked pt to call back to confirm

## 2019-12-16 NOTE — Telephone Encounter (Signed)
Spoke to [place] she is going to contact the patient and get her scheduled

## 2019-12-16 NOTE — Telephone Encounter (Signed)
Brandon with [facility] called and would like to extend care for two more weeks for occupational therapy. You can reach him at [phone].

## 2019-12-16 NOTE — Telephone Encounter (Signed)
[name] was notified and given the [facility] for verbal orders.

## 2019-12-16 NOTE — Telephone Encounter (Signed)
ok 

## 2019-12-16 NOTE — Telephone Encounter (Signed)
I have called and left a message on the triage line to find out what happened

## 2019-12-16 NOTE — Telephone Encounter (Signed)
Charlotte please advise.  Brandon at [facility] would like to extend care for 2 more weeks for occupational therapy.

## 2019-12-16 NOTE — Telephone Encounter (Signed)
Chantel w/[facility] Called to inquire on referral to our clinic she said pt was never scheduled and referral was closed for pt by Adline Potter .  Routed to [employer] call back # [phone]

## 2019-12-18 NOTE — Telephone Encounter (Signed)
Pt is sch to see Dr Haroldine Laws on 7/22

## 2019-12-21 DIAGNOSIS — N183 Chronic kidney disease, stage 3 unspecified: Secondary | ICD-10-CM | POA: Diagnosis not present

## 2019-12-21 DIAGNOSIS — D500 Iron deficiency anemia secondary to blood loss (chronic): Secondary | ICD-10-CM | POA: Diagnosis not present

## 2019-12-21 DIAGNOSIS — I130 Hypertensive heart and chronic kidney disease with heart failure and stage 1 through stage 4 chronic kidney disease, or unspecified chronic kidney disease: Secondary | ICD-10-CM | POA: Diagnosis not present

## 2019-12-21 DIAGNOSIS — I5032 Chronic diastolic (congestive) heart failure: Secondary | ICD-10-CM | POA: Diagnosis not present

## 2019-12-21 DIAGNOSIS — N184 Chronic kidney disease, stage 4 (severe): Secondary | ICD-10-CM | POA: Diagnosis not present

## 2019-12-21 DIAGNOSIS — K5731 Diverticulosis of large intestine without perforation or abscess with bleeding: Secondary | ICD-10-CM | POA: Diagnosis not present

## 2019-12-21 DIAGNOSIS — I0981 Rheumatic heart failure: Secondary | ICD-10-CM | POA: Diagnosis not present

## 2019-12-23 ENCOUNTER — Ambulatory Visit: Payer: Self-pay | Admitting: *Deleted

## 2019-12-23 DIAGNOSIS — N183 Chronic kidney disease, stage 3 unspecified: Secondary | ICD-10-CM | POA: Diagnosis not present

## 2019-12-23 DIAGNOSIS — K5731 Diverticulosis of large intestine without perforation or abscess with bleeding: Secondary | ICD-10-CM | POA: Diagnosis not present

## 2019-12-23 DIAGNOSIS — I0981 Rheumatic heart failure: Secondary | ICD-10-CM | POA: Diagnosis not present

## 2019-12-23 DIAGNOSIS — I130 Hypertensive heart and chronic kidney disease with heart failure and stage 1 through stage 4 chronic kidney disease, or unspecified chronic kidney disease: Secondary | ICD-10-CM | POA: Diagnosis not present

## 2019-12-23 DIAGNOSIS — D500 Iron deficiency anemia secondary to blood loss (chronic): Secondary | ICD-10-CM | POA: Diagnosis not present

## 2019-12-23 DIAGNOSIS — I5032 Chronic diastolic (congestive) heart failure: Secondary | ICD-10-CM | POA: Diagnosis not present

## 2019-12-27 DIAGNOSIS — I739 Peripheral vascular disease, unspecified: Secondary | ICD-10-CM | POA: Diagnosis not present

## 2019-12-27 DIAGNOSIS — I0981 Rheumatic heart failure: Secondary | ICD-10-CM | POA: Diagnosis not present

## 2019-12-27 DIAGNOSIS — Z86711 Personal history of pulmonary embolism: Secondary | ICD-10-CM | POA: Diagnosis not present

## 2019-12-27 DIAGNOSIS — Z8673 Personal history of transient ischemic attack (TIA), and cerebral infarction without residual deficits: Secondary | ICD-10-CM | POA: Diagnosis not present

## 2019-12-27 DIAGNOSIS — Z7951 Long term (current) use of inhaled steroids: Secondary | ICD-10-CM | POA: Diagnosis not present

## 2019-12-27 DIAGNOSIS — G4733 Obstructive sleep apnea (adult) (pediatric): Secondary | ICD-10-CM | POA: Diagnosis not present

## 2019-12-27 DIAGNOSIS — Z9181 History of falling: Secondary | ICD-10-CM | POA: Diagnosis not present

## 2019-12-27 DIAGNOSIS — Z940 Kidney transplant status: Secondary | ICD-10-CM | POA: Diagnosis not present

## 2019-12-27 DIAGNOSIS — I420 Dilated cardiomyopathy: Secondary | ICD-10-CM | POA: Diagnosis not present

## 2019-12-27 DIAGNOSIS — F419 Anxiety disorder, unspecified: Secondary | ICD-10-CM | POA: Diagnosis not present

## 2019-12-27 DIAGNOSIS — J45909 Unspecified asthma, uncomplicated: Secondary | ICD-10-CM | POA: Diagnosis not present

## 2019-12-27 DIAGNOSIS — N183 Chronic kidney disease, stage 3 unspecified: Secondary | ICD-10-CM | POA: Diagnosis not present

## 2019-12-27 DIAGNOSIS — F329 Major depressive disorder, single episode, unspecified: Secondary | ICD-10-CM | POA: Diagnosis not present

## 2019-12-27 DIAGNOSIS — I081 Rheumatic disorders of both mitral and tricuspid valves: Secondary | ICD-10-CM | POA: Diagnosis not present

## 2019-12-27 DIAGNOSIS — Z79891 Long term (current) use of opiate analgesic: Secondary | ICD-10-CM | POA: Diagnosis not present

## 2019-12-27 DIAGNOSIS — E213 Hyperparathyroidism, unspecified: Secondary | ICD-10-CM | POA: Diagnosis not present

## 2019-12-27 DIAGNOSIS — D631 Anemia in chronic kidney disease: Secondary | ICD-10-CM | POA: Diagnosis not present

## 2019-12-27 DIAGNOSIS — M1030 Gout due to renal impairment, unspecified site: Secondary | ICD-10-CM | POA: Diagnosis not present

## 2019-12-27 DIAGNOSIS — L409 Psoriasis, unspecified: Secondary | ICD-10-CM | POA: Diagnosis not present

## 2019-12-27 DIAGNOSIS — K5731 Diverticulosis of large intestine without perforation or abscess with bleeding: Secondary | ICD-10-CM | POA: Diagnosis not present

## 2019-12-27 DIAGNOSIS — I4819 Other persistent atrial fibrillation: Secondary | ICD-10-CM | POA: Diagnosis not present

## 2019-12-27 DIAGNOSIS — D500 Iron deficiency anemia secondary to blood loss (chronic): Secondary | ICD-10-CM | POA: Diagnosis not present

## 2019-12-27 DIAGNOSIS — Z7952 Long term (current) use of systemic steroids: Secondary | ICD-10-CM | POA: Diagnosis not present

## 2019-12-27 DIAGNOSIS — I5032 Chronic diastolic (congestive) heart failure: Secondary | ICD-10-CM | POA: Diagnosis not present

## 2019-12-27 DIAGNOSIS — I130 Hypertensive heart and chronic kidney disease with heart failure and stage 1 through stage 4 chronic kidney disease, or unspecified chronic kidney disease: Secondary | ICD-10-CM | POA: Diagnosis not present

## 2019-12-28 ENCOUNTER — Ambulatory Visit (INDEPENDENT_AMBULATORY_CARE_PROVIDER_SITE_OTHER): Payer: Medicare Other | Admitting: Psychology

## 2019-12-28 ENCOUNTER — Other Ambulatory Visit: Payer: Self-pay

## 2019-12-28 DIAGNOSIS — D500 Iron deficiency anemia secondary to blood loss (chronic): Secondary | ICD-10-CM | POA: Diagnosis not present

## 2019-12-28 DIAGNOSIS — D631 Anemia in chronic kidney disease: Secondary | ICD-10-CM | POA: Diagnosis not present

## 2019-12-28 DIAGNOSIS — N183 Chronic kidney disease, stage 3 unspecified: Secondary | ICD-10-CM | POA: Diagnosis not present

## 2019-12-28 DIAGNOSIS — I5032 Chronic diastolic (congestive) heart failure: Secondary | ICD-10-CM | POA: Diagnosis not present

## 2019-12-28 DIAGNOSIS — I0981 Rheumatic heart failure: Secondary | ICD-10-CM | POA: Diagnosis not present

## 2019-12-28 DIAGNOSIS — F4322 Adjustment disorder with anxiety: Secondary | ICD-10-CM

## 2019-12-28 DIAGNOSIS — I130 Hypertensive heart and chronic kidney disease with heart failure and stage 1 through stage 4 chronic kidney disease, or unspecified chronic kidney disease: Secondary | ICD-10-CM | POA: Diagnosis not present

## 2019-12-29 ENCOUNTER — Ambulatory Visit (INDEPENDENT_AMBULATORY_CARE_PROVIDER_SITE_OTHER): Payer: Medicare Other | Admitting: Nurse Practitioner

## 2019-12-29 ENCOUNTER — Encounter: Payer: Self-pay | Admitting: Nurse Practitioner

## 2019-12-29 ENCOUNTER — Ambulatory Visit: Payer: Self-pay | Admitting: *Deleted

## 2019-12-29 VITALS — BP 132/82 | HR 73 | Temp 96.6°F | Ht 65.0 in | Wt 148.0 lb

## 2019-12-29 DIAGNOSIS — I10 Essential (primary) hypertension: Secondary | ICD-10-CM

## 2019-12-29 DIAGNOSIS — F322 Major depressive disorder, single episode, severe without psychotic features: Secondary | ICD-10-CM | POA: Diagnosis not present

## 2019-12-29 NOTE — Progress Notes (Signed)
Subjective:  Patient ID: [name], female    DOB: April 15, 1946  Age: 74 y.o. MRN: [id number]  CC: Follow-up (HTN and depression-pt reports stable Bp around 128 top//pt reports depression is doing better//pt not fasting-had a Boost this morn)  HPI  HTN: Stable BP BP Readings from Last 3 Encounters:  12/29/19 132/82  12/03/19 140/90  11/27/19 (!) 145/82   Improved mood with cymbalta. Denies any adverse side effects. Depression screen [facility] 2/9 12/29/2019 11/05/2019 05/29/2019  Decreased Interest 1 1 2   Down, Depressed, Hopeless 1 2 1   PHQ - 2 Score 2 3 3   Altered sleeping 1 1 2   Tired, decreased energy 1 3 2   Change in appetite 3 3 3   Feeling bad or failure about yourself  0 3 1  Trouble concentrating 1 0 0  Moving slowly or fidgety/restless 2 3 1   Suicidal thoughts 0 0 0  PHQ-9 Score 10 16 12   Difficult doing work/chores Somewhat difficult Somewhat difficult -  Some recent data might be hidden   Reviewed past Medical, Social and Family history today.  Outpatient Medications Prior to Visit  Medication Sig Dispense Refill  . acetaminophen (TYLENOL) 500 MG tablet Take 500 mg by mouth every 6 (six) hours as needed for mild pain or headache.     . albuterol (PROVENTIL) (2.5 MG/3ML) 0.083% nebulizer solution Take 3 mLs (2.5 mg total) by nebulization every 6 (six) hours as needed for wheezing or shortness of breath. Dx:J45.909 150 mL 2  . budesonide-formoterol (SYMBICORT) 160-4.5 MCG/ACT inhaler Inhale 2 puffs into the lungs 2 (two) times daily. 1 Inhaler 5  . calcitRIOL (ROCALTROL) 0.25 MCG capsule Take 0.25 mcg by mouth every other day.    . carvedilol (COREG) 12.5 MG tablet Take 2 tablets (25 mg total) by mouth 2 (two) times daily.    Marland Kitchen docusate sodium (COLACE) 100 MG capsule Take 100 mg by mouth daily as needed (For constipation.).     Marland Kitchen DULoxetine (CYMBALTA) 30 MG capsule TAKE 1 CAPSULE BY MOUTH EVERY DAY 90 capsule 2  . Ferrous Sulfate (IRON PO) Take 325 mg by mouth daily.      . furosemide (LASIX) 80 MG tablet Take 1 tablet (80 mg total) by mouth 2 (two) times daily. 60 tablet 3  . magnesium oxide (MAG-OX) 400 MG tablet Take 2 tablets by mouth 2 (two) times daily.    . mycophenolate (MYFORTIC) 180 MG EC tablet Take 180 mg by mouth 2 (two) times daily.    . ondansetron (ZOFRAN) 4 MG tablet Take 1 tablet (4 mg total) by mouth every 6 (six) hours as needed for nausea. 20 tablet 0  . potassium chloride SA (K-DUR,KLOR-CON) 20 MEQ tablet Take 20 mEq by mouth daily.     . predniSONE (DELTASONE) 5 MG tablet Take 1 tablet (5 mg total) by mouth daily with breakfast. Please resume after you have completed prednisone taper    . tacrolimus (PROGRAF) 1 MG capsule Take 2 mg by mouth 2 (two) times daily.     . VENTOLIN HFA 108 (90 Base) MCG/ACT inhaler     . pantoprazole (PROTONIX) 40 MG tablet Take 1 tablet (40 mg total) by mouth daily. (Patient not taking: Reported on 12/29/2019) 30 tablet 0   No facility-administered medications prior to visit.    ROS See HPI  Objective:  BP 132/82   Pulse 73   Temp (!) 96.6 F (35.9 C) (Tympanic)   Ht 5\' 5"  (1.651 m)   Wt 148  lb (67.1 kg)   SpO2 100%   BMI 24.63 kg/m   BP Readings from Last 3 Encounters:  12/29/19 132/82  12/03/19 140/90  11/27/19 (!) 145/82    Wt Readings from Last 3 Encounters:  12/29/19 148 lb (67.1 kg)  12/03/19 145 lb 6.4 oz (66 kg)  11/27/19 141 lb (64 kg)    Physical Exam Cardiovascular:     Rate and Rhythm: Normal rate. Rhythm irregular.     Pulses: Normal pulses.     Heart sounds: Normal heart sounds.  Pulmonary:     Effort: Pulmonary effort is normal.     Breath sounds: Normal breath sounds.  Musculoskeletal:     Right lower leg: No edema.     Left lower leg: No edema.  Neurological:     Mental Status: She is alert and oriented to person, place, and time.  Psychiatric:        Mood and Affect: Mood normal.        Behavior: Behavior normal.        Thought Content: Thought content normal.      Lab Results  Component Value Date   WBC 8.3 11/27/2019   HGB 10.8 (L) 11/27/2019   HCT 33.8 (L) 11/27/2019   PLT 284 11/27/2019   GLUCOSE 114 (H) 11/27/2019   CHOL  11/16/2008    184        ATP III CLASSIFICATION:  <200     mg/dL   Desirable  200-239  mg/dL   Borderline High  >=240    mg/dL   High          TRIG 157 (H) 09/15/2011   HDL 33 (L) 11/16/2008   LDLCALC (H) 11/16/2008    130        Total Cholesterol/HDL:CHD Risk Coronary Heart Disease Risk Table                     Men   Women  1/2 Average Risk   3.4   3.3  Average Risk       5.0   4.4  2 X Average Risk   9.6   7.1  3 X Average Risk  23.4   11.0        Use the calculated Patient Ratio above and the CHD Risk Table to determine the patient's CHD Risk.        ATP III CLASSIFICATION (LDL):  <100     mg/dL   Optimal  100-129  mg/dL   Near or Above                    Optimal  130-159  mg/dL   Borderline  160-189  mg/dL   High  >190     mg/dL   Very High   ALT 12 11/15/2019   AST 13 (L) 11/15/2019   NA 141 11/27/2019   K 4.8 11/27/2019   CL 99 11/27/2019   CREATININE 2.26 (H) 11/27/2019   BUN 21 11/27/2019   CO2 30 (H) 11/27/2019   TSH 1.450 11/27/2019   INR 1.3 (H) 11/15/2019   HGBA1C  11/16/2008    5.5 (NOTE) The ADA recommends the following therapeutic goal for glycemic control related to Hgb A1c measurement: Goal of therapy: <6.5 Hgb A1c  Reference: American Diabetes Association: Clinical Practice Recommendations 2010, Diabetes Care, 2010, 33: (Suppl  1).    No results found.  Assessment & Plan:  This visit occurred during the SARS-CoV-2 public  health emergency.  Safety protocols were in place, including screening questions prior to the visit, additional usage of staff PPE, and extensive cleaning of exam room while observing appropriate contact time as indicated for disinfecting solutions.   [name] was seen today for follow-up.  Diagnoses and all orders for this visit:  Current severe  episode of major depressive disorder without psychotic features without prior episode [facility])  Essential hypertension   I have discontinued [name]'s pantoprazole. I am also having her maintain her docusate sodium, mycophenolate, acetaminophen, potassium chloride SA, tacrolimus, Ferrous Sulfate (IRON PO), predniSONE, albuterol, furosemide, magnesium oxide, carvedilol, ondansetron, DULoxetine, Ventolin HFA, budesonide-formoterol, and calcitRIOL.  No orders of the defined types were placed in this encounter.   Problem List Items Addressed This Visit      Cardiovascular and Mediastinum   Essential hypertension     Other   Depression - Primary      Follow-up: Return in about 6 months (around 06/29/2020) for Depression and HTN (fasting, 11mins).  Wilfred Lacy, NP

## 2019-12-29 NOTE — Patient Instructions (Signed)
Maintain current medications F/up in 6months 

## 2019-12-30 ENCOUNTER — Ambulatory Visit: Payer: Self-pay | Admitting: *Deleted

## 2019-12-30 DIAGNOSIS — D500 Iron deficiency anemia secondary to blood loss (chronic): Secondary | ICD-10-CM | POA: Diagnosis not present

## 2019-12-30 DIAGNOSIS — I0981 Rheumatic heart failure: Secondary | ICD-10-CM | POA: Diagnosis not present

## 2019-12-30 DIAGNOSIS — I5032 Chronic diastolic (congestive) heart failure: Secondary | ICD-10-CM | POA: Diagnosis not present

## 2019-12-30 DIAGNOSIS — N183 Chronic kidney disease, stage 3 unspecified: Secondary | ICD-10-CM | POA: Diagnosis not present

## 2019-12-30 DIAGNOSIS — D631 Anemia in chronic kidney disease: Secondary | ICD-10-CM | POA: Diagnosis not present

## 2019-12-30 DIAGNOSIS — I130 Hypertensive heart and chronic kidney disease with heart failure and stage 1 through stage 4 chronic kidney disease, or unspecified chronic kidney disease: Secondary | ICD-10-CM | POA: Diagnosis not present

## 2019-12-31 ENCOUNTER — Ambulatory Visit: Payer: Self-pay | Admitting: *Deleted

## 2020-01-01 ENCOUNTER — Encounter: Payer: Self-pay | Admitting: Nurse Practitioner

## 2020-01-01 ENCOUNTER — Ambulatory Visit: Payer: Self-pay | Admitting: *Deleted

## 2020-01-04 ENCOUNTER — Encounter: Payer: Self-pay | Admitting: Family

## 2020-01-04 ENCOUNTER — Ambulatory Visit (INDEPENDENT_AMBULATORY_CARE_PROVIDER_SITE_OTHER): Payer: Medicare Other | Admitting: Family

## 2020-01-04 ENCOUNTER — Other Ambulatory Visit: Payer: Self-pay

## 2020-01-04 VITALS — BP 120/78 | HR 71 | Temp 97.5°F | Ht 65.0 in | Wt 146.2 lb

## 2020-01-04 DIAGNOSIS — L03115 Cellulitis of right lower limb: Secondary | ICD-10-CM | POA: Diagnosis not present

## 2020-01-04 MED ORDER — CEPHALEXIN 500 MG PO CAPS: 500.0000 mg | ORAL_CAPSULE | Freq: Three times a day (TID) | ORAL | 0 refills | Status: DC

## 2020-01-04 NOTE — Progress Notes (Signed)
Acute Office Visit  Subjective:    Patient ID: TONNIE STILLMAN, female    DOB: Apr 11, 1946, 74 y.o.   MRN: [id number]  Chief Complaint  Patient presents with  . Acute Visit    Pt c/o spot on rt leg, swelling, blister, painful at times.  Pt was seen here last Tuesday for issue and it has worsened since then.  Pt said it would be 2 weeks Monday.    HPI Patient is in today for a recheck of a blister on her right lower leg x 2 weeks. She reports now having redness, swelling, and more pain. She has been elevating her leg that helps some. No fever or chills.   Past Medical History:  Diagnosis Date  . Acute diastolic (congestive) heart failure ([place]) 08/12/2017  . AKI (acute kidney injury) ([place]) 11/04/2017  . Anemia   . Anxiety   . Arthritis    "shoulders; arms; hips" (02/04/2018)  . Asthma   . Atrial fibrillation ([place])   . Atrial flutter [facility])    s/p aflutter ablation at [facility]  . Benign hypertension with ESRD (end-stage renal disease) ([place])   . Blood transfusion    never had a reaction to blood transfusion  . Cardiomyopathy Mar 2013   Mild, EF 50-55% by Mar 2013 ECHO, diast dysfxn II  . Cardiomyopathy ([place])   . CHF (congestive heart failure) ([place]) 2005  . CHF (congestive heart failure) ([place])   . Childhood asthma   . Closed fracture of right distal radius 10/18/2016  . CMV (cytomegalovirus infection) ([place]) 10/03/2015  . Constipation    takes Miralax daily  . Constipation   . Depression    takes Zoloft daily  . Distal radius fracture, right   . DVT of lower extremity, bilateral ([place]) 12/21/11   "they're there now; been there for 2 wks"  . Eczema   . ESRD (end stage renal disease) on dialysis [facility]) 09/2011   07-19-2015 had Kidney transplant at [facility]; "don't get dialysis anymore" (02/04/2018)  . Fractures, stress    in both feet--6 OR 7 YRS AGO--RESOLVED  . Generalized edema [id number]  . Gout    doesn't require meds   . HCAP (healthcare-associated pneumonia) 09/10/2011  . Hearing  difficulty   . High cholesterol   . History of CVA (cerebrovascular accident) without residual deficits   . History of hip replacement, total, right   . History of pneumonia   . Hx of Clostridium difficile infection   . Hypercalcemia    09/10/11  . Hyperparathyroidism ([place]) 04/07/2013  . Hypertension    takes Diltiazem daily   . Hypomagnesemia 08/02/2015  . Hypophosphatemia 11/08/2017  . ICB (intracranial bleed) ([place])   . Immunosuppression ([place]) 07/25/2015  . Memory changes   . Morbid obesity ([place])   . Nonischemic dilated cardiomyopathy ([place])   . Obesity 04/07/2013  . Oligouria   . OSA on CPAP   . PAF (paroxysmal atrial fibrillation) ([facility])     HX OF CEREBRAL BLEED WHILE ON COUMADIN-SO PT NOT ON ANY BLOOD THINNERS NOW  . Peripheral vascular disease ([place])   . Presence of Watchman left atrial appendage closure device 10/04/2017  . Proteinuria 09/04/2016  . Psoriasis 08/07/2016  . 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 S/P insertion of IVC (inferior vena caval) filter   . Sepsis ([place]) 11/08/2017  . Stress fracture    bilateral feet  . Stroke [facility]) 2009   denies residual on 02/04/2018;  hemorrhagic now off coumadin  . Stroke  due to intracerebral hemorrhage ([place]) 2009  . Suture reaction 09/04/2016  . Transfusion history   . Use of cane as ambulatory aid     Past Surgical History:  Procedure Laterality Date  . AV FISTULA PLACEMENT  09/28/2011   Procedure: ARTERIOVENOUS (AV) FISTULA CREATION;  Surgeon: Rosetta Posner, MD;  Location: [place];  Service: Vascular;  Laterality: Left;  . CARDIAC CATHETERIZATION    . CARDIAC VALVE SURGERY  1960    FOR ATRIAL SEPTAL DEFECT  . CHOLECYSTECTOMY     1980's  . COLONOSCOPY    . COLONOSCOPY WITH PROPOFOL N/A 11/16/2019   Procedure: COLONOSCOPY WITH PROPOFOL;  Surgeon: Juanita Craver, MD;  Location: [facility] ENDOSCOPY;  Service: Endoscopy;  Laterality: N/A;  . CYSTOSCOPY     many yrs ago  . ESOPHAGOGASTRODUODENOSCOPY (EGD) WITH PROPOFOL N/A 11/16/2019     Procedure: ESOPHAGOGASTRODUODENOSCOPY (EGD) WITH PROPOFOL;  Surgeon: Juanita Craver, MD;  Location: [name] ENDOSCOPY;  Service: Endoscopy;  Laterality: N/A;  . FEMUR FRACTURE SURGERY Right 03/2011; 09/03/2011   "had 2, 2 wk apart in 2012; broke it again 08/2011 & had OR"  . FRACTURE SURGERY    . I & D EXTREMITY  09/15/2011   Procedure: IRRIGATION AND DEBRIDEMENT EXTREMITY w REMOVAL OF HARDWARE;  Surgeon: [name], MD;  Location: [place];  Service: Orthopedics;  Laterality: Right;  . INCISION AND DRAINAGE HIP  12/21/2011   Procedure: IRRIGATION AND DEBRIDEMENT HIP;  Surgeon: [name], MD;  Location: [place];  Service: Orthopedics;  Laterality: Right;  I&D RIGHT HIP WITH PLACEMENT ANTIBIOTIC SPACER  . INSERTION OF DIALYSIS CATHETER     Procedure: INSERTION OF DIALYSIS CATHETER;  Surgeon: Rosetta Posner, MD;  Location: [place];  Service: Vascular;  Laterality: Right;  . IRRIGATION AND DEBRIDEMENT ABSCESS  12/21/11   right hip  . JOINT REPLACEMENT    . KIDNEY TRANSPLANT  07/19/15  . LAPAROSCOPIC GASTRIC BANDING  2008  . LEFT ATRIAL APPENDAGE OCCLUSION  10/04/2017  . OPEN REDUCTION INTERNAL FIXATION (ORIF) DISTAL RADIAL FRACTURE Right 10/18/2016   Procedure: OPEN REDUCTION INTERNAL FIXATION (ORIF) RIGHT DISTAL RADIAL FRACTURE;  Surgeon: Marchia Bond, MD;  Location: [place];  Service: Orthopedics;  Laterality: Right;  . TOTAL HIP ARTHROPLASTY Right 02/2011   right THA 02/2011, I&D/removal of hardware 09/2011,, repeat I&D Jun 2013, reimplantation R THA 03-26-2012  . TOTAL HIP REVISION  03/25/2012   Procedure: TOTAL HIP REVISION;  Surgeon: [name], MD;  Location: [facility];  Service: Orthopedics;  Laterality: Right;  Right Total Hip Reimplantation  . TUBAL LIGATION    . VENA CAVA FILTER PLACEMENT  11/2011    Family History  Problem Relation Age of Onset  . Cancer Father   . Diabetes Mother   . Hypertension Mother   . Arthritis Mother   . Hodgkin's lymphoma Other 32        dscd---HODGKINS DISEASE  . Hypertension Brother     Social History   Socioeconomic History  . Marital status: Married    Spouse name: Not on file  . Number of children: Not on file  . Years of education: Not on file  . Highest education level: Not on file  Occupational History  . Occupation: retired  Tobacco Use  . Smoking status: Never Smoker  . Smokeless tobacco: Never Used  Vaping Use  . Vaping Use: Never used  Substance and Sexual Activity  . Alcohol use: Never  . Drug use: Never  .  Sexual activity: Yes    Birth control/protection: Post-menopausal  Other Topics Concern  . Not on file  Social History Narrative   Married; retired; lives in [place] Strain: High Risk  . Difficulty of Paying Living Expenses: Hard  Food Insecurity:   . Worried About Charity fundraiser in the Last Year:   . Arboriculturist in the Last Year:   Transportation Needs: No Transportation Needs  . Lack of Transportation (Medical): No  . Lack of Transportation (Non-Medical): No  Physical Activity:   . Days of Exercise per Week:   . Minutes of Exercise per Session:   Stress:   . Feeling of Stress :   Social Connections:   . Frequency of Communication with Friends and Family:   . Frequency of Social Gatherings with Friends and Family:   . Attends Religious Services:   . Active Member of Clubs or Organizations:   . Attends Archivist Meetings:   Marland Kitchen Marital Status:   Intimate Partner Violence:   . Fear of Current or Ex-Partner:   . Emotionally Abused:   Marland Kitchen Physically Abused:   . Sexually Abused:     Outpatient Medications Prior to Visit  Medication Sig Dispense Refill  . acetaminophen (TYLENOL) 500 MG tablet Take 500 mg by mouth every 6 (six) hours as needed for mild pain or headache.     . albuterol (PROVENTIL) (2.5 MG/3ML) 0.083% nebulizer solution Take 3 mLs (2.5 mg total) by nebulization every 6 (six) hours as needed  for wheezing or shortness of breath. Dx:J45.909 150 mL 2  . budesonide-formoterol (SYMBICORT) 160-4.5 MCG/ACT inhaler Inhale 2 puffs into the lungs 2 (two) times daily. 1 Inhaler 5  . calcitRIOL (ROCALTROL) 0.25 MCG capsule Take 0.25 mcg by mouth every other day.    . carvedilol (COREG) 12.5 MG tablet Take 2 tablets (25 mg total) by mouth 2 (two) times daily.    Marland Kitchen docusate sodium (COLACE) 100 MG capsule Take 100 mg by mouth daily as needed (For constipation.).     Marland Kitchen DULoxetine (CYMBALTA) 30 MG capsule TAKE 1 CAPSULE BY MOUTH EVERY DAY 90 capsule 2  . Ferrous Sulfate (IRON PO) Take 325 mg by mouth daily.     . furosemide (LASIX) 80 MG tablet Take 1 tablet (80 mg total) by mouth 2 (two) times daily. 60 tablet 3  . magnesium oxide (MAG-OX) 400 MG tablet Take 2 tablets by mouth 2 (two) times daily.    . mycophenolate (MYFORTIC) 180 MG EC tablet Take 180 mg by mouth 2 (two) times daily.    . ondansetron (ZOFRAN) 4 MG tablet Take 1 tablet (4 mg total) by mouth every 6 (six) hours as needed for nausea. 20 tablet 0  . potassium chloride SA (K-DUR,KLOR-CON) 20 MEQ tablet Take 20 mEq by mouth daily.     . predniSONE (DELTASONE) 5 MG tablet Take 1 tablet (5 mg total) by mouth daily with breakfast. Please resume after you have completed prednisone taper    . tacrolimus (PROGRAF) 1 MG capsule Take 2 mg by mouth 2 (two) times daily.     . VENTOLIN HFA 108 (90 Base) MCG/ACT inhaler      No facility-administered medications prior to visit.    Allergies  Allergen Reactions  . Ace Inhibitors Other (See Comments)    Reaction unknown  . Amiodarone Other (See Comments)    Pt with Restrictive Lung Disease by 10/2017 PFT  with decreased DLCO  . Warfarin Other (See Comments)    Left side brain hemorrhage  . Warfarin Sodium Other (See Comments)    Caused her to have a stroke    Review of Systems  Constitutional: Negative.   Respiratory: Negative.   Cardiovascular: Positive for leg swelling. Negative for chest  pain and palpitations.  Musculoskeletal: Negative.   Allergic/Immunologic: Negative.   Psychiatric/Behavioral: Negative.        Objective:    Physical Exam Constitutional:      Appearance: Normal appearance.  Cardiovascular:     Rate and Rhythm: Normal rate and regular rhythm.     Pulses: Normal pulses.     Heart sounds: Normal heart sounds.  Musculoskeletal:     Cervical back: Normal range of motion.     Comments: Right lower leg: Red, swollen, tender to touch. Small blister noted to the area.   Skin:      Neurological:     General: No focal deficit present.     Mental Status: She is alert and oriented to person, place, and time.  Psychiatric:        Mood and Affect: Mood normal.     BP 120/78 (BP Location: Right Arm, Patient Position: Sitting, Cuff Size: Normal)   Pulse 71   Temp (!) 97.5 F (36.4 C) (Temporal)   Ht 5\' 5"  (1.651 m)   Wt 146 lb 3.2 oz (66.3 kg)   SpO2 98%   BMI 24.33 kg/m  Wt Readings from Last 3 Encounters:  01/04/20 146 lb 3.2 oz (66.3 kg)  12/29/19 148 lb (67.1 kg)  12/03/19 145 lb 6.4 oz (66 kg)    There are no preventive care reminders to display for this patient.  There are no preventive care reminders to display for this patient.   Lab Results  Component Value Date   TSH 1.450 11/27/2019   Lab Results  Component Value Date   WBC 7.6 12/16/2019   HGB 10.7 (A) 12/16/2019   HCT 32 (A) 12/16/2019   MCV 88 11/27/2019   PLT 284 11/27/2019   Lab Results  Component Value Date   NA 140 12/16/2019   K 3.7 12/16/2019   CO2 30 (A) 12/16/2019   GLUCOSE 114 (H) 11/27/2019   BUN 23 (A) 12/16/2019   CREATININE 2.26 (H) 11/27/2019   BILITOT 1.5 (H) 11/15/2019   ALKPHOS 121 12/16/2019   AST 15 12/16/2019   ALT 9 12/16/2019   PROT 5.5 (L) 11/15/2019   ALBUMIN 4.1 12/16/2019   CALCIUM 9.9 12/16/2019   ANIONGAP 10 11/16/2019   Lab Results  Component Value Date   CHOL  11/16/2008    184        ATP III CLASSIFICATION:  <200      mg/dL   Desirable  200-239  mg/dL   Borderline High  >=240    mg/dL   High          Lab Results  Component Value Date   HDL 33 (L) 11/16/2008   Lab Results  Component Value Date   LDLCALC (H) 11/16/2008    130        Total Cholesterol/HDL:CHD Risk Coronary Heart Disease Risk Table                     Men   Women  1/2 Average Risk   3.4   3.3  Average Risk       5.0   4.4  2 X  Average Risk   9.6   7.1  3 X Average Risk  23.4   11.0        Use the calculated Patient Ratio above and the CHD Risk Table to determine the patient's CHD Risk.        ATP III CLASSIFICATION (LDL):  <100     mg/dL   Optimal  100-129  mg/dL   Near or Above                    Optimal  130-159  mg/dL   Borderline  160-189  mg/dL   High  >190     mg/dL   Very High   Lab Results  Component Value Date   TRIG 157 (H) 09/15/2011   Lab Results  Component Value Date   CHOLHDL 5.6 11/16/2008   Lab Results  Component Value Date   HGBA1C  11/16/2008    5.5 (NOTE) The ADA recommends the following therapeutic goal for glycemic control related to Hgb A1c measurement: Goal of therapy: <6.5 Hgb A1c  Reference: American Diabetes Association: Clinical Practice Recommendations 2010, Diabetes Care, 2010, 33: (Suppl  1).       Assessment & Plan:   Problem List Items Addressed This Visit    None        Meds ordered this encounter  Medications  . cephALEXin (KEFLEX) 500 MG capsule    Sig: Take 1 capsule (500 mg total) by mouth 3 (three) times daily.    Dispense:  21 capsule    Refill:  0   Delia was seen today for acute visit.  Diagnoses and all orders for this visit:  Cellulitis of right lower extremity  Other orders -     cephALEXin (KEFLEX) 500 MG capsule; Take 1 capsule (500 mg total) by mouth 3 (three) times daily.    Kennyth Arnold, FNP

## 2020-01-04 NOTE — Patient Instructions (Signed)

## 2020-01-06 DIAGNOSIS — I5032 Chronic diastolic (congestive) heart failure: Secondary | ICD-10-CM | POA: Diagnosis not present

## 2020-01-06 DIAGNOSIS — D500 Iron deficiency anemia secondary to blood loss (chronic): Secondary | ICD-10-CM | POA: Diagnosis not present

## 2020-01-06 DIAGNOSIS — D631 Anemia in chronic kidney disease: Secondary | ICD-10-CM | POA: Diagnosis not present

## 2020-01-06 DIAGNOSIS — I130 Hypertensive heart and chronic kidney disease with heart failure and stage 1 through stage 4 chronic kidney disease, or unspecified chronic kidney disease: Secondary | ICD-10-CM | POA: Diagnosis not present

## 2020-01-06 DIAGNOSIS — I0981 Rheumatic heart failure: Secondary | ICD-10-CM | POA: Diagnosis not present

## 2020-01-06 DIAGNOSIS — N183 Chronic kidney disease, stage 3 unspecified: Secondary | ICD-10-CM | POA: Diagnosis not present

## 2020-01-08 ENCOUNTER — Ambulatory Visit: Payer: Self-pay | Admitting: *Deleted

## 2020-01-08 DIAGNOSIS — I0981 Rheumatic heart failure: Secondary | ICD-10-CM | POA: Diagnosis not present

## 2020-01-08 DIAGNOSIS — D500 Iron deficiency anemia secondary to blood loss (chronic): Secondary | ICD-10-CM | POA: Diagnosis not present

## 2020-01-08 DIAGNOSIS — I5032 Chronic diastolic (congestive) heart failure: Secondary | ICD-10-CM | POA: Diagnosis not present

## 2020-01-08 DIAGNOSIS — N183 Chronic kidney disease, stage 3 unspecified: Secondary | ICD-10-CM | POA: Diagnosis not present

## 2020-01-08 DIAGNOSIS — I130 Hypertensive heart and chronic kidney disease with heart failure and stage 1 through stage 4 chronic kidney disease, or unspecified chronic kidney disease: Secondary | ICD-10-CM | POA: Diagnosis not present

## 2020-01-08 DIAGNOSIS — D631 Anemia in chronic kidney disease: Secondary | ICD-10-CM | POA: Diagnosis not present

## 2020-01-11 ENCOUNTER — Ambulatory Visit: Payer: Medicare Other | Admitting: Psychology

## 2020-01-11 ENCOUNTER — Ambulatory Visit: Payer: Self-pay | Admitting: *Deleted

## 2020-01-11 DIAGNOSIS — I130 Hypertensive heart and chronic kidney disease with heart failure and stage 1 through stage 4 chronic kidney disease, or unspecified chronic kidney disease: Secondary | ICD-10-CM | POA: Diagnosis not present

## 2020-01-11 DIAGNOSIS — N183 Chronic kidney disease, stage 3 unspecified: Secondary | ICD-10-CM | POA: Diagnosis not present

## 2020-01-11 DIAGNOSIS — D500 Iron deficiency anemia secondary to blood loss (chronic): Secondary | ICD-10-CM | POA: Diagnosis not present

## 2020-01-11 DIAGNOSIS — I0981 Rheumatic heart failure: Secondary | ICD-10-CM | POA: Diagnosis not present

## 2020-01-11 DIAGNOSIS — I5032 Chronic diastolic (congestive) heart failure: Secondary | ICD-10-CM | POA: Diagnosis not present

## 2020-01-11 DIAGNOSIS — D631 Anemia in chronic kidney disease: Secondary | ICD-10-CM | POA: Diagnosis not present

## 2020-01-12 ENCOUNTER — Ambulatory Visit: Payer: Self-pay | Admitting: *Deleted

## 2020-01-13 ENCOUNTER — Ambulatory Visit: Payer: Self-pay | Admitting: *Deleted

## 2020-01-14 ENCOUNTER — Ambulatory Visit: Payer: Self-pay | Admitting: *Deleted

## 2020-01-14 DIAGNOSIS — I5032 Chronic diastolic (congestive) heart failure: Secondary | ICD-10-CM | POA: Diagnosis not present

## 2020-01-14 DIAGNOSIS — I0981 Rheumatic heart failure: Secondary | ICD-10-CM | POA: Diagnosis not present

## 2020-01-14 DIAGNOSIS — I130 Hypertensive heart and chronic kidney disease with heart failure and stage 1 through stage 4 chronic kidney disease, or unspecified chronic kidney disease: Secondary | ICD-10-CM | POA: Diagnosis not present

## 2020-01-14 DIAGNOSIS — D500 Iron deficiency anemia secondary to blood loss (chronic): Secondary | ICD-10-CM | POA: Diagnosis not present

## 2020-01-14 DIAGNOSIS — N183 Chronic kidney disease, stage 3 unspecified: Secondary | ICD-10-CM | POA: Diagnosis not present

## 2020-01-14 DIAGNOSIS — D631 Anemia in chronic kidney disease: Secondary | ICD-10-CM | POA: Diagnosis not present

## 2020-01-15 ENCOUNTER — Ambulatory Visit: Payer: Self-pay | Admitting: *Deleted

## 2020-01-19 ENCOUNTER — Ambulatory Visit: Payer: Self-pay | Admitting: *Deleted

## 2020-01-20 ENCOUNTER — Ambulatory Visit: Payer: Self-pay | Admitting: *Deleted

## 2020-01-22 ENCOUNTER — Ambulatory Visit: Payer: Self-pay | Admitting: *Deleted

## 2020-01-22 DIAGNOSIS — D500 Iron deficiency anemia secondary to blood loss (chronic): Secondary | ICD-10-CM | POA: Diagnosis not present

## 2020-01-22 DIAGNOSIS — I130 Hypertensive heart and chronic kidney disease with heart failure and stage 1 through stage 4 chronic kidney disease, or unspecified chronic kidney disease: Secondary | ICD-10-CM | POA: Diagnosis not present

## 2020-01-22 DIAGNOSIS — N183 Chronic kidney disease, stage 3 unspecified: Secondary | ICD-10-CM | POA: Diagnosis not present

## 2020-01-22 DIAGNOSIS — D631 Anemia in chronic kidney disease: Secondary | ICD-10-CM | POA: Diagnosis not present

## 2020-01-22 DIAGNOSIS — I0981 Rheumatic heart failure: Secondary | ICD-10-CM | POA: Diagnosis not present

## 2020-01-22 DIAGNOSIS — I5032 Chronic diastolic (congestive) heart failure: Secondary | ICD-10-CM | POA: Diagnosis not present

## 2020-01-25 ENCOUNTER — Ambulatory Visit (INDEPENDENT_AMBULATORY_CARE_PROVIDER_SITE_OTHER): Payer: Medicare Other | Admitting: Psychology

## 2020-01-25 DIAGNOSIS — F4322 Adjustment disorder with anxiety: Secondary | ICD-10-CM | POA: Diagnosis not present

## 2020-01-26 ENCOUNTER — Ambulatory Visit: Payer: Self-pay | Admitting: *Deleted

## 2020-01-26 DIAGNOSIS — Z7952 Long term (current) use of systemic steroids: Secondary | ICD-10-CM | POA: Diagnosis not present

## 2020-01-26 DIAGNOSIS — E213 Hyperparathyroidism, unspecified: Secondary | ICD-10-CM | POA: Diagnosis not present

## 2020-01-26 DIAGNOSIS — F329 Major depressive disorder, single episode, unspecified: Secondary | ICD-10-CM | POA: Diagnosis not present

## 2020-01-26 DIAGNOSIS — J45909 Unspecified asthma, uncomplicated: Secondary | ICD-10-CM | POA: Diagnosis not present

## 2020-01-26 DIAGNOSIS — I739 Peripheral vascular disease, unspecified: Secondary | ICD-10-CM | POA: Diagnosis not present

## 2020-01-26 DIAGNOSIS — I420 Dilated cardiomyopathy: Secondary | ICD-10-CM | POA: Diagnosis not present

## 2020-01-26 DIAGNOSIS — D631 Anemia in chronic kidney disease: Secondary | ICD-10-CM | POA: Diagnosis not present

## 2020-01-26 DIAGNOSIS — G4733 Obstructive sleep apnea (adult) (pediatric): Secondary | ICD-10-CM | POA: Diagnosis not present

## 2020-01-26 DIAGNOSIS — M1030 Gout due to renal impairment, unspecified site: Secondary | ICD-10-CM | POA: Diagnosis not present

## 2020-01-26 DIAGNOSIS — F419 Anxiety disorder, unspecified: Secondary | ICD-10-CM | POA: Diagnosis not present

## 2020-01-26 DIAGNOSIS — K5731 Diverticulosis of large intestine without perforation or abscess with bleeding: Secondary | ICD-10-CM | POA: Diagnosis not present

## 2020-01-26 DIAGNOSIS — I4819 Other persistent atrial fibrillation: Secondary | ICD-10-CM | POA: Diagnosis not present

## 2020-01-26 DIAGNOSIS — Z9181 History of falling: Secondary | ICD-10-CM | POA: Diagnosis not present

## 2020-01-26 DIAGNOSIS — I130 Hypertensive heart and chronic kidney disease with heart failure and stage 1 through stage 4 chronic kidney disease, or unspecified chronic kidney disease: Secondary | ICD-10-CM | POA: Diagnosis not present

## 2020-01-26 DIAGNOSIS — I5032 Chronic diastolic (congestive) heart failure: Secondary | ICD-10-CM | POA: Diagnosis not present

## 2020-01-26 DIAGNOSIS — I0981 Rheumatic heart failure: Secondary | ICD-10-CM | POA: Diagnosis not present

## 2020-01-26 DIAGNOSIS — L409 Psoriasis, unspecified: Secondary | ICD-10-CM | POA: Diagnosis not present

## 2020-01-26 DIAGNOSIS — Z8673 Personal history of transient ischemic attack (TIA), and cerebral infarction without residual deficits: Secondary | ICD-10-CM | POA: Diagnosis not present

## 2020-01-26 DIAGNOSIS — Z86711 Personal history of pulmonary embolism: Secondary | ICD-10-CM | POA: Diagnosis not present

## 2020-01-26 DIAGNOSIS — N183 Chronic kidney disease, stage 3 unspecified: Secondary | ICD-10-CM | POA: Diagnosis not present

## 2020-01-26 DIAGNOSIS — I081 Rheumatic disorders of both mitral and tricuspid valves: Secondary | ICD-10-CM | POA: Diagnosis not present

## 2020-01-26 DIAGNOSIS — Z7951 Long term (current) use of inhaled steroids: Secondary | ICD-10-CM | POA: Diagnosis not present

## 2020-01-26 DIAGNOSIS — Z79891 Long term (current) use of opiate analgesic: Secondary | ICD-10-CM | POA: Diagnosis not present

## 2020-01-26 DIAGNOSIS — D500 Iron deficiency anemia secondary to blood loss (chronic): Secondary | ICD-10-CM | POA: Diagnosis not present

## 2020-01-26 DIAGNOSIS — Z940 Kidney transplant status: Secondary | ICD-10-CM | POA: Diagnosis not present

## 2020-01-27 DIAGNOSIS — D500 Iron deficiency anemia secondary to blood loss (chronic): Secondary | ICD-10-CM | POA: Diagnosis not present

## 2020-01-27 DIAGNOSIS — N183 Chronic kidney disease, stage 3 unspecified: Secondary | ICD-10-CM | POA: Diagnosis not present

## 2020-01-27 DIAGNOSIS — I0981 Rheumatic heart failure: Secondary | ICD-10-CM | POA: Diagnosis not present

## 2020-01-27 DIAGNOSIS — I5032 Chronic diastolic (congestive) heart failure: Secondary | ICD-10-CM | POA: Diagnosis not present

## 2020-01-27 DIAGNOSIS — I130 Hypertensive heart and chronic kidney disease with heart failure and stage 1 through stage 4 chronic kidney disease, or unspecified chronic kidney disease: Secondary | ICD-10-CM | POA: Diagnosis not present

## 2020-01-27 DIAGNOSIS — D631 Anemia in chronic kidney disease: Secondary | ICD-10-CM | POA: Diagnosis not present

## 2020-01-28 ENCOUNTER — Other Ambulatory Visit: Payer: Self-pay

## 2020-01-28 ENCOUNTER — Ambulatory Visit (INDEPENDENT_AMBULATORY_CARE_PROVIDER_SITE_OTHER): Payer: Medicare Other | Admitting: Family Medicine

## 2020-01-28 ENCOUNTER — Encounter: Payer: Self-pay | Admitting: Family Medicine

## 2020-01-28 VITALS — BP 126/70 | HR 69 | Temp 97.9°F | Ht 65.0 in | Wt 144.0 lb

## 2020-01-28 DIAGNOSIS — S81801D Unspecified open wound, right lower leg, subsequent encounter: Secondary | ICD-10-CM | POA: Diagnosis not present

## 2020-01-28 DIAGNOSIS — S81801A Unspecified open wound, right lower leg, initial encounter: Secondary | ICD-10-CM | POA: Insufficient documentation

## 2020-01-28 DIAGNOSIS — T792XXD Traumatic secondary and recurrent hemorrhage and seroma, subsequent encounter: Secondary | ICD-10-CM | POA: Diagnosis not present

## 2020-01-28 NOTE — Progress Notes (Signed)
Established Patient Office Visit  Subjective:  Patient ID: [name], female    DOB: [birth year]  Age: 74 y.o. MRN: [id number]  CC:  Chief Complaint  Patient presents with  . Follow-up    follow up on wound, per patient not healing    HPI [name] presents for follow-up of a wound on her right leg.  Wound occurred when a bag of frozen chicken wings fell from her freezer and landed on her right lower leg.  Injury was a little over a month ago and the wound is not healed.  Treated with 10 days of Keflex which did not seem to help.  Occasionally painful.  No pain with ambulation.  Denies fevers chills streaking or discharge from the wound.  Patient is taking Prograf status post renal transplant back in 2017.  Past Medical History:  Diagnosis Date  . Acute diastolic (congestive) heart failure ([place]) 08/12/2017  . AKI (acute kidney injury) ([place]) 11/04/2017  . Anemia   . Anxiety   . Arthritis    "shoulders; arms; hips" (02/04/2018)  . Asthma   . Atrial fibrillation ([place])   . Atrial flutter [facility])    s/p aflutter ablation at [facility]  . Benign hypertension with ESRD (end-stage renal disease) ([employer])   . Blood transfusion    never had a reaction to blood transfusion  . Cardiomyopathy Mar 2013   Mild, EF 50-55% by Mar 2013 ECHO, diast dysfxn II  . Cardiomyopathy ([place])   . CHF (congestive heart failure) ([place]) 2005  . CHF (congestive heart failure) ([place])   . Childhood asthma   . Closed fracture of right distal radius 10/18/2016  . CMV (cytomegalovirus infection) ([place]) 10/03/2015  . Constipation    takes Miralax daily  . Constipation   . Depression    takes Zoloft daily  . Distal radius fracture, right   . DVT of lower extremity, bilateral ([place]) 12/21/11   "they're there now; been there for 2 wks"  . Eczema   . ESRD (end stage renal disease) on dialysis [facility]) 09/2011   07-19-2015 had Kidney transplant at [facility]; "don't get dialysis anymore" (02/04/2018)  . Fractures, stress    in  both feet--6 OR 7 YRS AGO--RESOLVED  . Generalized edema [id number]  . Gout    doesn't require meds   . HCAP (healthcare-associated pneumonia) 09/10/2011  . Hearing difficulty   . High cholesterol   . History of CVA (cerebrovascular accident) without residual deficits   . History of hip replacement, total, right   . History of pneumonia   . Hx of Clostridium difficile infection   . Hypercalcemia    09/10/11  . Hyperparathyroidism ([place]) 04/07/2013  . Hypertension    takes Diltiazem daily   . Hypomagnesemia 08/02/2015  . Hypophosphatemia 11/08/2017  . ICB (intracranial bleed) ([place])   . Immunosuppression ([place]) 07/25/2015  . Memory changes   . Morbid obesity ([place])   . Nonischemic dilated cardiomyopathy ([place])   . Obesity 04/07/2013  . Oligouria   . OSA on CPAP   . PAF (paroxysmal atrial fibrillation) ([facility])     HX OF CEREBRAL BLEED WHILE ON COUMADIN-SO PT NOT ON ANY BLOOD THINNERS NOW  . Peripheral vascular disease ([place])   . Presence of Watchman left atrial appendage closure device 10/04/2017  . Proteinuria 09/04/2016  . Psoriasis 08/07/2016  . 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 S/P insertion of IVC (inferior vena caval) filter   . Sepsis ([place]) 11/08/2017  . Stress fracture  bilateral feet  . Stroke [facility]) 2009   denies residual on 02/04/2018;  hemorrhagic now off coumadin  . Stroke due to intracerebral hemorrhage ([place]) 2009  . Suture reaction 09/04/2016  . Transfusion history   . Use of cane as ambulatory aid     Past Surgical History:  Procedure Laterality Date  . AV FISTULA PLACEMENT  09/28/2011   Procedure: ARTERIOVENOUS (AV) FISTULA CREATION;  Surgeon: Rosetta Posner, MD;  Location: [place];  Service: Vascular;  Laterality: Left;  . CARDIAC CATHETERIZATION    . CARDIAC VALVE SURGERY  1960    FOR ATRIAL SEPTAL DEFECT  . CHOLECYSTECTOMY     1980's  . COLONOSCOPY    . COLONOSCOPY WITH PROPOFOL N/A 11/16/2019   Procedure: COLONOSCOPY WITH PROPOFOL;  Surgeon: Juanita Craver, MD;  Location: [facility] ENDOSCOPY;  Service: Endoscopy;  Laterality: N/A;  . CYSTOSCOPY     many yrs ago  . ESOPHAGOGASTRODUODENOSCOPY (EGD) WITH PROPOFOL N/A 11/16/2019   Procedure: ESOPHAGOGASTRODUODENOSCOPY (EGD) WITH PROPOFOL;  Surgeon: Juanita Craver, MD;  Location: [facility] ENDOSCOPY;  Service: Endoscopy;  Laterality: N/A;  . FEMUR FRACTURE SURGERY Right 03/2011; 09/03/2011   "had 2, 2 wk apart in 2012; broke it again 08/2011 & had OR"  . FRACTURE SURGERY    . I & D EXTREMITY  09/15/2011   Procedure: IRRIGATION AND DEBRIDEMENT EXTREMITY w REMOVAL OF HARDWARE;  Surgeon: [name], MD;  Location: [place];  Service: Orthopedics;  Laterality: Right;  . INCISION AND DRAINAGE HIP  12/21/2011   Procedure: IRRIGATION AND DEBRIDEMENT HIP;  Surgeon: [name], MD;  Location: [place];  Service: Orthopedics;  Laterality: Right;  I&D RIGHT HIP WITH PLACEMENT ANTIBIOTIC SPACER  . INSERTION OF DIALYSIS CATHETER     Procedure: INSERTION OF DIALYSIS CATHETER;  Surgeon: Rosetta Posner, MD;  Location: [place];  Service: Vascular;  Laterality: Right;  . IRRIGATION AND DEBRIDEMENT ABSCESS  12/21/11   right hip  . JOINT REPLACEMENT    . KIDNEY TRANSPLANT  07/19/15  . LAPAROSCOPIC GASTRIC BANDING  2008  . LEFT ATRIAL APPENDAGE OCCLUSION  10/04/2017  . OPEN REDUCTION INTERNAL FIXATION (ORIF) DISTAL RADIAL FRACTURE Right 10/18/2016   Procedure: OPEN REDUCTION INTERNAL FIXATION (ORIF) RIGHT DISTAL RADIAL FRACTURE;  Surgeon: Marchia Bond, MD;  Location: [place];  Service: Orthopedics;  Laterality: Right;  . TOTAL HIP ARTHROPLASTY Right 02/2011   right THA 02/2011, I&D/removal of hardware 09/2011,, repeat I&D Jun 2013, reimplantation R THA 03-26-2012  . TOTAL HIP REVISION  03/25/2012   Procedure: TOTAL HIP REVISION;  Surgeon: [name], MD;  Location: [facility];  Service: Orthopedics;  Laterality: Right;  Right Total Hip Reimplantation  . TUBAL LIGATION    . VENA CAVA FILTER PLACEMENT  11/2011    Family History   Problem Relation Age of Onset  . Cancer Father   . Diabetes Mother   . Hypertension Mother   . Arthritis Mother   . Hodgkin's lymphoma Other 32       dscd---HODGKINS DISEASE  . Hypertension Brother     Social History   Socioeconomic History  . Marital status: Married    Spouse name: Not on file  . Number of children: Not on file  . Years of education: Not on file  . Highest education level: Not on file  Occupational History  . Occupation: retired  Tobacco Use  . Smoking status: Never Smoker  . Smokeless tobacco: Never Used  Vaping Use  . Vaping  Use: Never used  Substance and Sexual Activity  . Alcohol use: Never  . Drug use: Never  . Sexual activity: Yes    Birth control/protection: Post-menopausal  Other Topics Concern  . Not on file  Social History Narrative   Married; retired; lives in [place] Strain: High Risk  . Difficulty of Paying Living Expenses: Hard  Food Insecurity:   . Worried About Charity fundraiser in the Last Year:   . Arboriculturist in the Last Year:   Transportation Needs: No Transportation Needs  . Lack of Transportation (Medical): No  . Lack of Transportation (Non-Medical): No  Physical Activity:   . Days of Exercise per Week:   . Minutes of Exercise per Session:   Stress:   . Feeling of Stress :   Social Connections:   . Frequency of Communication with Friends and Family:   . Frequency of Social Gatherings with Friends and Family:   . Attends Religious Services:   . Active Member of Clubs or Organizations:   . Attends Archivist Meetings:   Marland Kitchen Marital Status:   Intimate Partner Violence:   . Fear of Current or Ex-Partner:   . Emotionally Abused:   Marland Kitchen Physically Abused:   . Sexually Abused:     Outpatient Medications Prior to Visit  Medication Sig Dispense Refill  . acetaminophen (TYLENOL) 500 MG tablet Take 500 mg by mouth every 6 (six) hours as needed for mild  pain or headache.     . albuterol (PROVENTIL) (2.5 MG/3ML) 0.083% nebulizer solution Take 3 mLs (2.5 mg total) by nebulization every 6 (six) hours as needed for wheezing or shortness of breath. Dx:J45.909 150 mL 2  . budesonide-formoterol (SYMBICORT) 160-4.5 MCG/ACT inhaler Inhale 2 puffs into the lungs 2 (two) times daily. 1 Inhaler 5  . calcitRIOL (ROCALTROL) 0.25 MCG capsule Take 0.25 mcg by mouth every other day.    . carvedilol (COREG) 12.5 MG tablet Take 2 tablets (25 mg total) by mouth 2 (two) times daily.    Marland Kitchen docusate sodium (COLACE) 100 MG capsule Take 100 mg by mouth daily as needed (For constipation.).     Marland Kitchen DULoxetine (CYMBALTA) 30 MG capsule TAKE 1 CAPSULE BY MOUTH EVERY DAY 90 capsule 2  . Ferrous Sulfate (IRON PO) Take 325 mg by mouth daily.     . furosemide (LASIX) 80 MG tablet Take 1 tablet (80 mg total) by mouth 2 (two) times daily. 60 tablet 3  . magnesium oxide (MAG-OX) 400 MG tablet Take 2 tablets by mouth 2 (two) times daily.    . mycophenolate (MYFORTIC) 180 MG EC tablet Take 180 mg by mouth 2 (two) times daily.    . potassium chloride SA (K-DUR,KLOR-CON) 20 MEQ tablet Take 20 mEq by mouth daily.     . predniSONE (DELTASONE) 5 MG tablet Take 1 tablet (5 mg total) by mouth daily with breakfast. Please resume after you have completed prednisone taper    . tacrolimus (PROGRAF) 1 MG capsule Take 2 mg by mouth 2 (two) times daily.     . VENTOLIN HFA 108 (90 Base) MCG/ACT inhaler     . cephALEXin (KEFLEX) 500 MG capsule Take 1 capsule (500 mg total) by mouth 3 (three) times daily. (Patient not taking: Reported on 01/28/2020) 21 capsule 0  . ondansetron (ZOFRAN) 4 MG tablet Take 1 tablet (4 mg total) by mouth every 6 (six) hours as needed for  nausea. (Patient not taking: Reported on 01/28/2020) 20 tablet 0   No facility-administered medications prior to visit.    Allergies  Allergen Reactions  . Ace Inhibitors Other (See Comments)    Reaction unknown  . Amiodarone Other (See  Comments)    Pt with Restrictive Lung Disease by 10/2017 PFT with decreased DLCO  . Warfarin Other (See Comments)    Left side brain hemorrhage  . Warfarin Sodium Other (See Comments)    Caused her to have a stroke    ROS Review of Systems  Constitutional: Negative for diaphoresis, fatigue, fever and unexpected weight change.  Respiratory: Negative.   Cardiovascular: Negative.   Gastrointestinal: Negative.  Negative for nausea and vomiting.  Skin: Positive for wound.  Allergic/Immunologic: Positive for immunocompromised state.  Neurological: Negative for weakness and numbness.  Hematological: Does not bruise/bleed easily.  Psychiatric/Behavioral: Negative.       Objective:    Physical Exam Constitutional:      Appearance: Normal appearance. She is normal weight.  HENT:     Head: Normocephalic and atraumatic.     Right Ear: External ear normal.     Left Ear: External ear normal.  Eyes:     General: No scleral icterus.       Right eye: No discharge.        Left eye: No discharge.     Conjunctiva/sclera: Conjunctivae normal.  Pulmonary:     Effort: Pulmonary effort is normal.  Skin:      Neurological:     General: No focal deficit present.     Mental Status: She is alert and oriented to person, place, and time.  Psychiatric:        Behavior: Behavior normal.     BP 126/70   Pulse 69   Temp 97.9 F (36.6 C) (Tympanic)   Ht 5\' 5"  (1.651 m)   Wt 144 lb (65.3 kg)   SpO2 97%   BMI 23.96 kg/m  Wt Readings from Last 3 Encounters:  01/28/20 144 lb (65.3 kg)  01/04/20 146 lb 3.2 oz (66.3 kg)  12/29/19 148 lb (67.1 kg)     There are no preventive care reminders to display for this patient.  There are no preventive care reminders to display for this patient.  Lab Results  Component Value Date   TSH 1.450 11/27/2019   Lab Results  Component Value Date   WBC 7.6 12/16/2019   HGB 10.7 (A) 12/16/2019   HCT 32 (A) 12/16/2019   MCV 88 11/27/2019   PLT 284  11/27/2019   Lab Results  Component Value Date   NA 140 12/16/2019   K 3.7 12/16/2019   CO2 30 (A) 12/16/2019   GLUCOSE 114 (H) 11/27/2019   BUN 23 (A) 12/16/2019   CREATININE 2.26 (H) 11/27/2019   BILITOT 1.5 (H) 11/15/2019   ALKPHOS 121 12/16/2019   AST 15 12/16/2019   ALT 9 12/16/2019   PROT 5.5 (L) 11/15/2019   ALBUMIN 4.1 12/16/2019   CALCIUM 9.9 12/16/2019   ANIONGAP 10 11/16/2019   Lab Results  Component Value Date   CHOL  11/16/2008    184        ATP III CLASSIFICATION:  <200     mg/dL   Desirable  200-239  mg/dL   Borderline High  >=240    mg/dL   High          Lab Results  Component Value Date   HDL 33 (L) 11/16/2008  Lab Results  Component Value Date   LDLCALC (H) 11/16/2008    130        Total Cholesterol/HDL:CHD Risk Coronary Heart Disease Risk Table                     Men   Women  1/2 Average Risk   3.4   3.3  Average Risk       5.0   4.4  2 X Average Risk   9.6   7.1  3 X Average Risk  23.4   11.0        Use the calculated Patient Ratio above and the CHD Risk Table to determine the patient's CHD Risk.        ATP III CLASSIFICATION (LDL):  <100     mg/dL   Optimal  100-129  mg/dL   Near or Above                    Optimal  130-159  mg/dL   Borderline  160-189  mg/dL   High  >190     mg/dL   Very High   Lab Results  Component Value Date   TRIG 157 (H) 09/15/2011   Lab Results  Component Value Date   CHOLHDL 5.6 11/16/2008   Lab Results  Component Value Date   HGBA1C  11/16/2008    5.5 (NOTE) The ADA recommends the following therapeutic goal for glycemic control related to Hgb A1c measurement: Goal of therapy: <6.5 Hgb A1c  Reference: American Diabetes Association: Clinical Practice Recommendations 2010, Diabetes Care, 2010, 33: (Suppl  1).      Assessment & Plan:   Problem List Items Addressed This Visit      Other   Wound of right leg - Primary   Relevant Orders   AMB referral to wound care center   Wound culture    Traumatic seroma of lower leg, right, subsequent encounter   Relevant Orders   AMB referral to wound care center   Wound culture      No orders of the defined types were placed in this encounter.   Follow-up: No follow-ups on file.    Libby Maw, MD

## 2020-01-31 LAB — WOUND CULTURE
MICRO NUMBER:: 10709731
SPECIMEN QUALITY:: ADEQUATE

## 2020-02-02 ENCOUNTER — Ambulatory Visit: Payer: Self-pay | Admitting: *Deleted

## 2020-02-02 DIAGNOSIS — I4891 Unspecified atrial fibrillation: Secondary | ICD-10-CM | POA: Diagnosis not present

## 2020-02-02 DIAGNOSIS — Z45018 Encounter for adjustment and management of other part of cardiac pacemaker: Secondary | ICD-10-CM | POA: Diagnosis not present

## 2020-02-03 DIAGNOSIS — I130 Hypertensive heart and chronic kidney disease with heart failure and stage 1 through stage 4 chronic kidney disease, or unspecified chronic kidney disease: Secondary | ICD-10-CM | POA: Diagnosis not present

## 2020-02-03 DIAGNOSIS — I5032 Chronic diastolic (congestive) heart failure: Secondary | ICD-10-CM | POA: Diagnosis not present

## 2020-02-03 DIAGNOSIS — N183 Chronic kidney disease, stage 3 unspecified: Secondary | ICD-10-CM | POA: Diagnosis not present

## 2020-02-03 DIAGNOSIS — D631 Anemia in chronic kidney disease: Secondary | ICD-10-CM | POA: Diagnosis not present

## 2020-02-03 DIAGNOSIS — D500 Iron deficiency anemia secondary to blood loss (chronic): Secondary | ICD-10-CM | POA: Diagnosis not present

## 2020-02-03 DIAGNOSIS — I0981 Rheumatic heart failure: Secondary | ICD-10-CM | POA: Diagnosis not present

## 2020-02-04 ENCOUNTER — Ambulatory Visit (HOSPITAL_COMMUNITY)
Admission: RE | Admit: 2020-02-04 | Discharge: 2020-02-04 | Disposition: A | Discharge status: Home / Self Care | Payer: Medicare Other | Source: Ambulatory Visit | Attending: Internal Medicine | Admitting: Internal Medicine

## 2020-02-04 ENCOUNTER — Encounter (HOSPITAL_COMMUNITY): Payer: Self-pay | Admitting: Internal Medicine

## 2020-02-04 ENCOUNTER — Other Ambulatory Visit: Payer: Self-pay

## 2020-02-04 VITALS — BP 140/82 | HR 78 | Wt 145.0 lb

## 2020-02-04 DIAGNOSIS — Z8673 Personal history of transient ischemic attack (TIA), and cerebral infarction without residual deficits: Secondary | ICD-10-CM | POA: Insufficient documentation

## 2020-02-04 DIAGNOSIS — Z79899 Other long term (current) drug therapy: Secondary | ICD-10-CM | POA: Insufficient documentation

## 2020-02-04 DIAGNOSIS — Z86718 Personal history of other venous thrombosis and embolism: Secondary | ICD-10-CM | POA: Insufficient documentation

## 2020-02-04 DIAGNOSIS — F419 Anxiety disorder, unspecified: Secondary | ICD-10-CM | POA: Insufficient documentation

## 2020-02-04 DIAGNOSIS — I739 Peripheral vascular disease, unspecified: Secondary | ICD-10-CM | POA: Diagnosis not present

## 2020-02-04 DIAGNOSIS — Z888 Allergy status to other drugs, medicaments and biological substances status: Secondary | ICD-10-CM | POA: Insufficient documentation

## 2020-02-04 DIAGNOSIS — J45909 Unspecified asthma, uncomplicated: Secondary | ICD-10-CM | POA: Diagnosis not present

## 2020-02-04 DIAGNOSIS — Z96641 Presence of right artificial hip joint: Secondary | ICD-10-CM | POA: Insufficient documentation

## 2020-02-04 DIAGNOSIS — Z7952 Long term (current) use of systemic steroids: Secondary | ICD-10-CM | POA: Insufficient documentation

## 2020-02-04 DIAGNOSIS — I5032 Chronic diastolic (congestive) heart failure: Secondary | ICD-10-CM

## 2020-02-04 DIAGNOSIS — N1832 Chronic kidney disease, stage 3b: Secondary | ICD-10-CM | POA: Diagnosis not present

## 2020-02-04 DIAGNOSIS — I480 Paroxysmal atrial fibrillation: Secondary | ICD-10-CM | POA: Diagnosis not present

## 2020-02-04 DIAGNOSIS — M159 Polyosteoarthritis, unspecified: Secondary | ICD-10-CM | POA: Diagnosis not present

## 2020-02-04 DIAGNOSIS — I4819 Other persistent atrial fibrillation: Secondary | ICD-10-CM | POA: Insufficient documentation

## 2020-02-04 DIAGNOSIS — Z8261 Family history of arthritis: Secondary | ICD-10-CM | POA: Insufficient documentation

## 2020-02-04 DIAGNOSIS — F329 Major depressive disorder, single episode, unspecified: Secondary | ICD-10-CM | POA: Diagnosis not present

## 2020-02-04 DIAGNOSIS — E213 Hyperparathyroidism, unspecified: Secondary | ICD-10-CM | POA: Insufficient documentation

## 2020-02-04 DIAGNOSIS — I482 Chronic atrial fibrillation, unspecified: Secondary | ICD-10-CM

## 2020-02-04 DIAGNOSIS — Z8701 Personal history of pneumonia (recurrent): Secondary | ICD-10-CM | POA: Diagnosis not present

## 2020-02-04 DIAGNOSIS — I420 Dilated cardiomyopathy: Secondary | ICD-10-CM | POA: Diagnosis not present

## 2020-02-04 DIAGNOSIS — E7800 Pure hypercholesterolemia, unspecified: Secondary | ICD-10-CM | POA: Diagnosis not present

## 2020-02-04 DIAGNOSIS — Z86711 Personal history of pulmonary embolism: Secondary | ICD-10-CM | POA: Insufficient documentation

## 2020-02-04 DIAGNOSIS — N186 End stage renal disease: Secondary | ICD-10-CM | POA: Diagnosis not present

## 2020-02-04 DIAGNOSIS — Z7951 Long term (current) use of inhaled steroids: Secondary | ICD-10-CM | POA: Insufficient documentation

## 2020-02-04 DIAGNOSIS — I130 Hypertensive heart and chronic kidney disease with heart failure and stage 1 through stage 4 chronic kidney disease, or unspecified chronic kidney disease: Secondary | ICD-10-CM | POA: Insufficient documentation

## 2020-02-04 DIAGNOSIS — Z940 Kidney transplant status: Secondary | ICD-10-CM | POA: Insufficient documentation

## 2020-02-04 DIAGNOSIS — G4733 Obstructive sleep apnea (adult) (pediatric): Secondary | ICD-10-CM | POA: Insufficient documentation

## 2020-02-04 DIAGNOSIS — Z8249 Family history of ischemic heart disease and other diseases of the circulatory system: Secondary | ICD-10-CM | POA: Insufficient documentation

## 2020-02-04 NOTE — Progress Notes (Signed)
ADVANCED HF CLINIC CONSULT NOTE  Referring Physician: Dr. Joan Mayans Primary Cardiologist: Dr. Fransico Him & Dr. Eldred Manges [facility])  HPI:  [name] is a 74 y.o. 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 Joan Mayans), s/p LAA closure w Watchman device by Dr. Gelene Mink (09/2017), who underwent a successful closure of a crescent shaped perivalvular leak around the previously placed 33 mm watchman device with coiling system on 09/16/2018. Referred by Dr. Joan Mayans for further evaluation and local management of her HF,   Had ASD repair at 74 years of age. Denies h/o known CAD   Admitted to [facility] on 10/22/2019 for acute respiratory failure with hypoxia 2/2 COPD and acute HFpEF exacerbation. BNP 970.7. TTE revealed LVEF 55-60%, mild concentric LVH, RV volume overload, RVSP 60.7 mmHg. Severely dilated LA and RA. Dilated IVC. Marland KitchenDiuresed with IV lasix and discharged on home on Lasix 80 mg BID. Also treated with IV solumedrol, nebulizer and inhaler treatments, and IV Zithromax. Was transitioned to oral prednisone with taper at discharge and instructed to continue home Breo Ellipta and doxycycline.   Readmitted to [facility] on 11/14/2019 for GI bleeding (acute onset painless hematochezia), Hgb 7.4, requiring 2 units of pRBCs. Colonoscopy during the admission revealed diverticulosis. EGD was unremarkable. ASA was held on discharge. D/c with hemoglobin of 9.3.  Saw Dr. Elsworth Soho and referred here for better management of her fluid status  Says she likes to sleep to 9-10am. Continues to work with HHPT/OT. Can get from room to room ok. Can go downstairs to laundry room. Uses a cane. Can drive herself to the store. No CP. Exertional  dyspnea much improved.  Had LE edema in 5.21 but better now. Has chronic RLE due to injury. Follows with Dr. Jimmy Footman. Last creatinine 2.2. SBP typically 120-130s    Echo 3/21 at [facility] EF 40-45: Echo 4/21 at [facility] EF 55-60% RVSP 61 mmHG    Review of Systems: [y] = yes, [ ]  = no   General: Weight gain [ ] ; Weight loss [ ] ; Anorexia [ ] ; Fatigue Blue.Reese ]; Fever [ ] ; Chills [ ] ; Weakness [ y]  Cardiac: Chest pain/pressure [ ] ; Resting SOB [ ] ; Exertional SOB Blue.Reese ]; Orthopnea [ ] ; Pedal Edema Blue.Reese ]; Palpitations [ ] ; Syncope [ ] ; Presyncope [ ] ; Paroxysmal nocturnal dyspnea[ ]   Pulmonary: Cough [ ] ; Wheezing[ ] ; Hemoptysis[ ] ; Sputum [ ] ; Snoring [ ]   GI: Vomiting[ ] ; Dysphagia[ ] ; Melena[ ] ; Hematochezia [ ] ; Heartburn[ ] ; Abdominal pain [ ] ; Constipation [ ] ; Diarrhea [ ] ; BRBPR Blue.Reese ]  GU: Hematuria[ ] ; Dysuria [ ] ; Nocturia[ ]   Vascular: Pain in legs with walking [ ] ; Pain in feet with lying flat [ ] ; Non-healing sores [ y]; Stroke [ ] ; TIA [ ] ; Slurred speech [ ] ;  Neuro: Headaches[ ] ; Vertigo[ ] ; Seizures[ ] ; Paresthesias[ ] ;Blurred vision [ ] ; Diplopia [ ] ; Vision changes [ ]   Ortho/Skin: Arthritis Blue.Reese ]; Joint pain [ y]; Muscle pain [ ] ; Joint swelling [ ] ; Back Pain [ ] ; Rash [ ]   Psych: Depression[ ] ; Anxiety[ ]   Heme: Bleeding problems [ ] ; Clotting disorders [ ] ; Anemia [ ]   Endocrine: Diabetes [ ] ; Thyroid dysfunction[ ]    Past Medical  History:  Diagnosis Date  . Acute diastolic (congestive) heart failure ([place]) 08/12/2017  . AKI (acute kidney injury) ([place]) 11/04/2017  . Anemia   . Anxiety   . Arthritis    "shoulders; arms; hips" (02/04/2018)  . Asthma   . Atrial fibrillation ([place])   . Atrial flutter [facility])    s/p aflutter ablation at [facility]  . Benign hypertension with ESRD (end-stage renal disease) ([place])   . Blood transfusion    never had a reaction to blood transfusion  . Cardiomyopathy Mar 2013   Mild, EF 50-55% by Mar 2013 ECHO, diast dysfxn II  . Cardiomyopathy ([place])   . CHF  (congestive heart failure) ([place]) 2005  . CHF (congestive heart failure) ([place])   . Childhood asthma   . Closed fracture of right distal radius 10/18/2016  . CMV (cytomegalovirus infection) ([place]) 10/03/2015  . Constipation    takes Miralax daily  . Constipation   . Depression    takes Zoloft daily  . Distal radius fracture, right   . DVT of lower extremity, bilateral ([employer]) 12/21/11   "they're there now; been there for 2 wks"  . Eczema   . ESRD (end stage renal disease) on dialysis [facility]) 09/2011   07-19-2015 had Kidney transplant at [facility]; "don't get dialysis anymore" (02/04/2018)  . Fractures, stress    in both feet--6 OR 7 YRS AGO--RESOLVED  . Generalized edema [id number]  . Gout    doesn't require meds   . HCAP (healthcare-associated pneumonia) 09/10/2011  . Hearing difficulty   . High cholesterol   . History of CVA (cerebrovascular accident) without residual deficits   . History of hip replacement, total, right   . History of pneumonia   . Hx of Clostridium difficile infection   . Hypercalcemia    09/10/11  . Hyperparathyroidism ([place]) 04/07/2013  . Hypertension    takes Diltiazem daily   . Hypomagnesemia 08/02/2015  . Hypophosphatemia 11/08/2017  . ICB (intracranial bleed) ([place])   . Immunosuppression ([place]) 07/25/2015  . Memory changes   . Morbid obesity ([place])   . Nonischemic dilated cardiomyopathy ([place])   . Obesity 04/07/2013  . Oligouria   . OSA on CPAP   . PAF (paroxysmal atrial fibrillation) ([facility])     HX OF CEREBRAL BLEED WHILE ON COUMADIN-SO PT NOT ON ANY BLOOD THINNERS NOW  . Peripheral vascular disease ([place])   . Presence of Watchman left atrial appendage closure device 10/04/2017  . Proteinuria 09/04/2016  . Psoriasis 08/07/2016  . 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 S/P insertion of IVC (inferior vena caval) filter   . Sepsis ([place]) 11/08/2017  . Stress fracture    bilateral feet  . Stroke [facility]) 2009   denies residual on 02/04/2018;  hemorrhagic now off  coumadin  . Stroke due to intracerebral hemorrhage ([place]) 2009  . Suture reaction 09/04/2016  . Transfusion history   . Use of cane as ambulatory aid     Current Outpatient Medications  Medication Sig Dispense Refill  . acetaminophen (TYLENOL) 500 MG tablet Take 500 mg by mouth every 6 (six) hours as needed for mild pain or headache.     . albuterol (PROVENTIL) (2.5 MG/3ML) 0.083% nebulizer solution Take 3 mLs (2.5 mg total) by nebulization every 6 (six) hours as needed for wheezing or shortness of breath. Dx:J45.909 150 mL 2  . budesonide-formoterol (SYMBICORT) 160-4.5 MCG/ACT inhaler Inhale 2 puffs into the lungs 2 (two) times daily. 1 Inhaler 5  . calcitRIOL (ROCALTROL) 0.25 MCG capsule  Take 0.25 mcg by mouth every other day.    . carvedilol (COREG) 12.5 MG tablet Take 2 tablets (25 mg total) by mouth 2 (two) times daily.    Marland Kitchen docusate sodium (COLACE) 100 MG capsule Take 100 mg by mouth daily as needed (For constipation.).     Marland Kitchen DULoxetine (CYMBALTA) 30 MG capsule TAKE 1 CAPSULE BY MOUTH EVERY DAY 90 capsule 2  . Ferrous Sulfate (IRON PO) Take 325 mg by mouth daily.     . furosemide (LASIX) 80 MG tablet Take 1 tablet (80 mg total) by mouth 2 (two) times daily. 60 tablet 3  . magnesium oxide (MAG-OX) 400 MG tablet Take 2 tablets by mouth 2 (two) times daily.    . mycophenolate (MYFORTIC) 180 MG EC tablet Take 180 mg by mouth 2 (two) times daily.    . potassium chloride SA (K-DUR,KLOR-CON) 20 MEQ tablet Take 20 mEq by mouth daily.     . predniSONE (DELTASONE) 5 MG tablet Take 1 tablet (5 mg total) by mouth daily with breakfast. Please resume after you have completed prednisone taper    . tacrolimus (PROGRAF) 1 MG capsule Take 2 mg by mouth 2 (two) times daily.     . VENTOLIN HFA 108 (90 Base) MCG/ACT inhaler     . ondansetron (ZOFRAN) 4 MG tablet Take 1 tablet (4 mg total) by mouth every 6 (six) hours as needed for nausea. (Patient not taking: Reported on 01/28/2020) 20 tablet 0   No current  facility-administered medications for this encounter.    Allergies  Allergen Reactions  . Ace Inhibitors Other (See Comments)    Reaction unknown  . 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 Warfarin Other (See Comments)    Left side brain hemorrhage  . Warfarin Sodium Other (See Comments)    Caused her to have a stroke      Social History   Socioeconomic History  . Marital status: Married    Spouse name: Not on file  . Number of children: Not on file  . Years of education: Not on file  . Highest education level: Not on file  Occupational History  . Occupation: retired  Tobacco Use  . Smoking status: Never Smoker  . Smokeless tobacco: Never Used  Vaping Use  . Vaping Use: Never used  Substance and Sexual Activity  . Alcohol use: Never  . Drug use: Never  . Sexual activity: Yes    Birth control/protection: Post-menopausal  Other Topics Concern  . Not on file  Social History Narrative   Married; retired; lives in [place] Strain: High Risk  . Difficulty of Paying Living Expenses: Hard  Food Insecurity:   . Worried About Charity fundraiser in the Last Year:   . Arboriculturist in the Last Year:   Transportation Needs: No Transportation Needs  . Lack of Transportation (Medical): No  . Lack of Transportation (Non-Medical): No  Physical Activity:   . Days of Exercise per Week:   . Minutes of Exercise per Session:   Stress:   . Feeling of Stress :   Social Connections:   . Frequency of Communication with Friends and Family:   . Frequency of Social Gatherings with Friends and Family:   . Attends Religious Services:   . Active Member of Clubs or Organizations:   . Attends Club or  Organization Meetings:   Marland Kitchen Marital Status:   Intimate Partner Violence:   . Fear of Current or Ex-Partner:   . Emotionally Abused:   Marland Kitchen Physically Abused:   .  Sexually Abused:       Family History  Problem Relation Age of Onset  . Cancer Father   . Diabetes Mother   . Hypertension Mother   . Arthritis Mother   . Hodgkin's lymphoma Other 32       dscd---HODGKINS DISEASE  . Hypertension Brother     Vitals:   02/04/20 1130  BP: (!) 140/82  Pulse: 78  SpO2: 99%  Weight: 65.8 kg (145 lb)    PHYSICAL EXAM: General:  Looks older than stated age. Frail No respiratory difficulty HEENT: normal Neck: supple. JVD 9 Carotids 2+ bilat; no bruits. No lymphadenopathy or thryomegaly appreciated. Cor: PMI nondisplaced. Irr egular rate & rhythm. No rubs, gallops or murmurs. Lungs: clear Abdomen: soft, nontender, nondistended. No hepatosplenomegaly. No bruits or masses. Good bowel sounds. Extremities: no cyanosis, clubbing, rash,  Large AVF in LUE.  RLE wound with some edema  Neuro: alert & oriented x 3, cranial nerves grossly intact. moves all 4 extremities w/o difficulty. Affect pleasant.  ECG: AF. VPaced at 107 Personally reviewed   ASSESSMENT & PLAN:  1. Chronic diastolic HF - Echo 5/95 at [facility] EF 40-45: - Echo 4/21. EF 55-60% - Volume status looks good now  - Continue lasix 80 bid. Can switch to torsemide as needed but need to avoid overdiuresis with transplanted kidney and CKD - Continue carvedilol  - Reinforced need for daily weights and reviewed use of sliding scale diuretics.  2. Chronic AF - s/p AVN ablation and pacing with single lead device -> ? Need to upgrade to CRT. Follows with EP at [facility] - s/p Watchman device  - Failed warfarin due to remote hemorrhagic CVA in 2010 - Can we rechallenge with Eliquis?   3. ESRD s/p renal transplant -> now CKD 3b - I gave her a note to discuss [place] with Dr. Jimmy Footman   4. RLE wound - followed by [place]  5. H/o DVT/PE - s/p IVC filter - off AC as above   Overall stable. Can see back in 6 months.   Glori Bickers, MD  10:33 PM

## 2020-02-04 NOTE — Patient Instructions (Signed)
Please call our office in December to schedule your follow up appointment  If you have any questions or concerns before your next appointment please send us a message through mychart or call our office at [phone].    TO LEAVE A MESSAGE FOR THE NURSE SELECT OPTION 2, PLEASE LEAVE A MESSAGE INCLUDING: . YOUR NAME . DATE OF BIRTH . CALL BACK NUMBER . REASON FOR CALL**this is important as we prioritize the call backs  YOU WILL RECEIVE A CALL BACK THE SAME DAY AS LONG AS YOU CALL BEFORE 4:00 PM  At the [facility], you and your health needs are our priority. As part of our continuing mission to provide you with exceptional heart care, we have created designated Provider Care Teams. These Care Teams include your primary Cardiologist (physician) and Advanced Practice Providers (APPs- Physician Assistants and Nurse Practitioners) who all work together to provide you with the care you need, when you need it.   You may see any of the following providers on your designated Care Team at your next follow up: . Dr Daniel Bensimhon . Dr Dalton McLean . Amy Clegg, NP . Brittainy Simmons, PA . Lauren Kemp, PharmD   Please be sure to bring in all your medications bottles to every appointment.    

## 2020-02-08 ENCOUNTER — Encounter (HOSPITAL_BASED_OUTPATIENT_CLINIC_OR_DEPARTMENT_OTHER): Payer: Medicare Other | Attending: Internal Medicine | Admitting: Internal Medicine

## 2020-02-08 ENCOUNTER — Ambulatory Visit: Payer: Self-pay | Admitting: *Deleted

## 2020-02-08 DIAGNOSIS — Z950 Presence of cardiac pacemaker: Secondary | ICD-10-CM | POA: Diagnosis not present

## 2020-02-08 DIAGNOSIS — I509 Heart failure, unspecified: Secondary | ICD-10-CM | POA: Diagnosis not present

## 2020-02-08 DIAGNOSIS — I420 Dilated cardiomyopathy: Secondary | ICD-10-CM | POA: Insufficient documentation

## 2020-02-08 DIAGNOSIS — I480 Paroxysmal atrial fibrillation: Secondary | ICD-10-CM | POA: Diagnosis not present

## 2020-02-08 DIAGNOSIS — J45909 Unspecified asthma, uncomplicated: Secondary | ICD-10-CM | POA: Insufficient documentation

## 2020-02-08 DIAGNOSIS — Z95828 Presence of other vascular implants and grafts: Secondary | ICD-10-CM | POA: Insufficient documentation

## 2020-02-08 DIAGNOSIS — F329 Major depressive disorder, single episode, unspecified: Secondary | ICD-10-CM | POA: Diagnosis not present

## 2020-02-08 DIAGNOSIS — W228XXD Striking against or struck by other objects, subsequent encounter: Secondary | ICD-10-CM | POA: Insufficient documentation

## 2020-02-08 DIAGNOSIS — M109 Gout, unspecified: Secondary | ICD-10-CM | POA: Insufficient documentation

## 2020-02-08 DIAGNOSIS — Z940 Kidney transplant status: Secondary | ICD-10-CM | POA: Insufficient documentation

## 2020-02-08 DIAGNOSIS — L97813 Non-pressure chronic ulcer of other part of right lower leg with necrosis of muscle: Secondary | ICD-10-CM | POA: Insufficient documentation

## 2020-02-08 DIAGNOSIS — S8011XD Contusion of right lower leg, subsequent encounter: Secondary | ICD-10-CM | POA: Insufficient documentation

## 2020-02-08 DIAGNOSIS — I110 Hypertensive heart disease with heart failure: Secondary | ICD-10-CM | POA: Insufficient documentation

## 2020-02-08 NOTE — Progress Notes (Signed)
[name] ([id number]) . Visit Report for 02/08/2020 Abuse/Suicide Risk Screen Details Patient Name: Date of Service: [name] [name]. 02/08/2020 1:15 PM Medical Record Number: [id number] Patient Account Number: [id number] Date of Birth/Sex: Treating RN: [birth year] (74 y.o. [name] Primary Care Olga Bourbeau: Wilfred Lacy Other Clinician: Referring Brielle Moro: Treating Lynnwood Beckford/Extender: Alvira Monday in Treatment: 0 Abuse/Suicide Risk Screen Items Answer ABUSE RISK SCREEN: Has anyone close to you tried to hurt or harm you recentlyo No Do you feel uncomfortable with anyone in your familyo No Has anyone forced you do things that you didnt want to doo No Electronic Signature(s) Signed: 02/08/2020 6:11:36 PM By: Kela Millin Entered By: Kela Millin on 02/08/2020 13:50:01 -------------------------------------------------------------------------------- Activities of Daily Living Details Patient Name: Date of Service: [name] [name]. 02/08/2020 1:15 PM Medical Record Number: [id number] Patient Account Number: [id number] Date of Birth/Sex: Treating RN: [birth year] (74 y.o. [name] Primary Care Tyson Parkison: Wilfred Lacy Other Clinician: Referring Nayana Lenig: Treating Mazal Ebey/Extender: Alvira Monday in Treatment: 0 Activities of Daily Living Items Answer Activities of Daily Living (Please select one for each item) Drive Automobile Completely Able [name] Medications ake Completely Able Use [name] elephone Completely Able Care for Appearance Completely Able Use [name] oilet Completely Able Bath / Shower Completely Able Dress Self Completely Able Feed Self Completely Able Walk Completely Able Get In / Out Bed Completely Able Housework Completely Able Prepare Meals Completely [place] for Self Completely Able Electronic Signature(s) Signed: 02/08/2020 6:11:36 PM By: Kela Millin Entered By: Kela Millin on 02/08/2020 13:50:24 -------------------------------------------------------------------------------- Education Screening Details Patient Name: Date of Service: [name], [name] [name]. 02/08/2020 1:15 PM Medical Record Number: [id number] Patient Account Number: [id number] Date of Birth/Sex: Treating RN: 1946/04/24 (74 y.o. [name] Primary Care Elizabet Schweppe: Wilfred Lacy Other Clinician: Referring Thula Stewart: Treating Jonee Lamore/Extender: Alvira Monday in Treatment: 0 Primary Learner Assessed: Patient Learning Preferences/Education Level/Primary Language Learning Preference: Explanation Highest Education Level: College or Above Preferred Language: English Cognitive Barrier Language Barrier: No Translator Needed: No Memory Deficit: No Emotional Barrier: No Cultural/Religious Beliefs Affecting Medical Care: No Physical Barrier Impaired Vision: No Impaired Hearing: No Decreased Hand dexterity: No Knowledge/Comprehension Knowledge Level: High Comprehension Level: High Ability to understand written instructions: High Ability to understand verbal instructions: High Motivation Anxiety Level: Calm Cooperation: Cooperative Education Importance: Acknowledges Need Interest in Health Problems: Asks Questions Perception: Coherent Willingness to Engage in Self-Management High Activities: Readiness to Engage in Self-Management High Activities: Electronic Signature(s) Signed: 02/08/2020 6:11:36 PM By: Kela Millin Entered By: Kela Millin on 02/08/2020 13:51:03 -------------------------------------------------------------------------------- Fall Risk Assessment Details Patient Name: Date of Service: [name], [name] [name]. 02/08/2020 1:15 PM Medical Record Number: [id number] Patient Account Number: [id number] Date of Birth/Sex: Treating RN: 02-13-1946 (74 y.o. [name] Primary Care [name]: Wilfred Lacy Other Clinician: Referring [name]: Treating [name]/Extender: Alvira Monday in Treatment: 0 Fall Risk Assessment Items Have you had 2 or more falls in the last 12 monthso 0 No Have you had any fall that resulted in injury in the last 12 monthso 0 No FALLS RISK SCREEN History of falling - immediate or within 3 months 0 No Secondary diagnosis (Do you have 2 or more medical diagnoseso) 15 Yes Ambulatory aid None/bed rest/wheelchair/nurse 0 Yes Crutches/cane/walker 0 No Furniture 0 No Intravenous therapy Access/Saline/Heparin Lock 0 No Gait/Transferring Normal/ bed rest/ wheelchair 0 Yes Weak (short steps with or without shuffle,  stooped but able to lift head while walking, may seek 0 No support from furniture) Impaired (short steps with shuffle, may have difficulty arising from chair, head down, impaired 0 No balance) Mental Status Oriented to own ability 0 Yes Electronic Signature(s) Signed: 02/08/2020 6:11:36 PM By: Kela Millin Entered By: Kela Millin on 02/08/2020 13:52:04 -------------------------------------------------------------------------------- Foot Assessment Details Patient Name: Date of Service: [name], [name] [name]. 02/08/2020 1:15 PM Medical Record Number: [id number] Patient Account Number: [id number] Date of Birth/Sex: Treating RN: 1945/08/11 (74 y.o. [name] Primary Care Ciro Tashiro: Wilfred Lacy Other Clinician: Referring [name]: Treating [name]/Extender: Alvira Monday in Treatment: 0 Foot Assessment Items Site Locations + = Sensation present, - = Sensation absent, C = Callus, U = Ulcer R = Redness, W = Warmth, M = Maceration, PU = Pre-ulcerative lesion F = Fissure, S = Swelling, D = Dryness Assessment Right: Left: Other Deformity: No No Prior Foot Ulcer: No No Prior Amputation: No No Charcot Joint: No No Ambulatory Status: Ambulatory Without Help Gait:  Steady Electronic Signature(s) Signed: 02/08/2020 6:11:36 PM By: Kela Millin Entered By: Kela Millin on 02/08/2020 13:52:37 -------------------------------------------------------------------------------- Nutrition Risk Screening Details Patient Name: Date of Service: [name] [name]. 02/08/2020 1:15 PM Medical Record Number: [id number] Patient Account Number: [id number] Date of Birth/Sex: Treating RN: 07-06-1946 (74 y.o. [name] Primary Care Jane Birkel: Wilfred Lacy Other Clinician: Referring Kishia Shackett: Treating Jameer Storie/Extender: Alvira Monday in Treatment: 0 Height (in): 65 Weight (lbs): 142 Body Mass Index (BMI): 23.6 Nutrition Risk Screening Items Score Screening NUTRITION RISK SCREEN: I have an illness or condition that made me change the kind and/or amount of food I eat 2 Yes I eat fewer than two meals per day 0 No I eat few fruits and vegetables, or milk products 0 No I have three or more drinks of beer, liquor or wine almost every day 0 No I have tooth or mouth problems that make it hard for me to eat 0 No I don'[name] always have enough money to buy the food I need 0 No I eat alone most of the time 0 No I take three or more different prescribed or over-the-counter drugs a day 1 Yes Without wanting to, I have lost or gained 10 pounds in the last six months 0 No I am not always physically able to shop, cook and/or feed myself 0 No Nutrition Protocols Good Risk Protocol Moderate Risk Protocol 0 Provide education on nutrition High Risk Proctocol Risk Level: Moderate Risk Score: 3 Electronic Signature(s) Signed: 02/08/2020 6:11:36 PM By: Kela Millin Entered By: Kela Millin on 02/08/2020 13:52:26

## 2020-02-09 ENCOUNTER — Other Ambulatory Visit: Payer: Self-pay | Admitting: Nurse Practitioner

## 2020-02-09 DIAGNOSIS — Z1231 Encounter for screening mammogram for malignant neoplasm of breast: Secondary | ICD-10-CM

## 2020-02-10 DIAGNOSIS — I5032 Chronic diastolic (congestive) heart failure: Secondary | ICD-10-CM | POA: Diagnosis not present

## 2020-02-10 DIAGNOSIS — D631 Anemia in chronic kidney disease: Secondary | ICD-10-CM | POA: Diagnosis not present

## 2020-02-10 DIAGNOSIS — I0981 Rheumatic heart failure: Secondary | ICD-10-CM | POA: Diagnosis not present

## 2020-02-10 DIAGNOSIS — I130 Hypertensive heart and chronic kidney disease with heart failure and stage 1 through stage 4 chronic kidney disease, or unspecified chronic kidney disease: Secondary | ICD-10-CM | POA: Diagnosis not present

## 2020-02-10 DIAGNOSIS — N183 Chronic kidney disease, stage 3 unspecified: Secondary | ICD-10-CM | POA: Diagnosis not present

## 2020-02-10 DIAGNOSIS — D500 Iron deficiency anemia secondary to blood loss (chronic): Secondary | ICD-10-CM | POA: Diagnosis not present

## 2020-02-10 NOTE — Progress Notes (Signed)
[name] ([id number]) . Visit Report for 02/08/2020 Allergy List Details Patient Name: Date of Service: [name] [name]. 02/08/2020 1:15 PM Medical Record Number: [id number] Patient Account Number: [id number] Date of Birth/Sex: Treating RN: 1945/11/05 (74 y.o. Female) [name] Primary Care [name]: [name] Jane Clinician: Referring [name]: Treating [name]: [name] in Treatment: 0 Allergies Active Allergies ACE Inhibitors amiodarone warfarin Reaction: caused stroke warfarin sodium Reaction: caused stroke Allergy Notes Electronic Signature(s) Signed: 02/08/2020 6:11:36 PM By: [name] Entered By: [name] on 02/08/2020 13:41:13 -------------------------------------------------------------------------------- Arrival Information Details Patient Name: Date of Service: [name], [name] [name]. 02/08/2020 1:15 PM Medical Record Number: [id number] Patient Account Number: [id number] Date of Birth/Sex: Treating RN: 04-25-1946 (74 y.o. Female) [name] Primary Care [name]: [name] [name]: Treating [name]: [name] in Treatment: 0 Visit Information Patient Arrived: Ambulatory Arrival Time: 13:37 Accompanied By: self Transfer Assistance: None Patient Identification Verified: Yes Secondary Verification Process Completed: Yes Patient Has Alerts: Yes Patient Alerts: Patient on Blood Thinner takes aspirin History Since Last Visit Electronic Signature(s) Signed: 02/08/2020 6:11:36 PM By: [name] Entered By: [name] on 02/08/2020 14:12:56 -------------------------------------------------------------------------------- Clinic Level of Care Assessment Details Patient Name: Date of Service: [name] [name]. 02/08/2020 1:15 PM Medical Record Number: [id number] Patient Account Number: [id number] Date of  Birth/Sex: Treating RN: October 30, 1945 (74 y.o. Female) [name] Primary Care Saniya Tranchina: [name] Jane Clinician: Referring [name]: Treating [name]/Extender: [name] in Treatment: 0 Clinic Level of Care Assessment Items TOOL 1 Quantity Score X- 1 0 Use when EandM and Procedure is performed on INITIAL visit ASSESSMENTS - Nursing Assessment / Reassessment X- 1 20 General Physical Exam (combine w/ comprehensive assessment (listed just below) when performed on new pt. evals) X- 1 25 Comprehensive Assessment (HX, ROS, Risk Assessments, Wounds Hx, etc.) ASSESSMENTS - Wound and Skin Assessment / Reassessment []  - 0 Dermatologic / Skin Assessment (not related to wound area) ASSESSMENTS - Ostomy and/or Continence Assessment and Care []  - 0 Incontinence Assessment and Management []  - 0 Ostomy Care Assessment and Management (repouching, etc.) PROCESS - Coordination of Care X - Simple Patient / Family Education for ongoing care 1 15 []  - 0 Complex (extensive) Patient / Family Education for ongoing care X- 1 10 Staff obtains Programmer, systems, Records, [name] Results / Process Orders est []  - 0 Staff telephones HHA, Nursing Homes / Clarify orders / etc []  - 0 Routine Transfer to another Facility (non-emergent condition) []  - 0 Routine Hospital Admission (non-emergent condition) X- 1 15 New Admissions / Biomedical engineer / Ordering NPWT Apligraf, etc. , []  - 0 Emergency Hospital Admission (emergent condition) PROCESS - Special Needs []  - 0 Pediatric / Minor Patient Management []  - 0 Isolation Patient Management []  - 0 Hearing / Language / Visual special needs []  - 0 Assessment of Community assistance (transportation, D/C planning, etc.) []  - 0 Additional assistance / Altered mentation []  - 0 Support Surface(s) Assessment (bed, cushion, seat, etc.) INTERVENTIONS - Miscellaneous []  - 0 External ear exam []  - 0 Patient Transfer (multiple  staff / Civil Service fast streamer / Similar devices) []  - 0 Simple Staple / Suture removal (25 or less) []  - 0 Complex Staple / Suture removal (26 or more) []  - 0 Hypo/Hyperglycemic Management (do not check if billed separately) X- 1 15 Ankle / Brachial Index (ABI) - do not check if billed separately Has the patient been seen at the  hospital within the last three years: Yes Total Score: 100 Level Of Care: New/Established - Level 3 Electronic Signature(s) Signed: 02/10/2020 6:13:34 PM By: Jane Hurst RN, BSN Signed: 02/10/2020 6:13:34 PM By: Jane Hurst RN, BSN Entered By: [name] on 02/08/2020 14:31:10 -------------------------------------------------------------------------------- Compression Therapy Details Patient Name: Date of Service: [name], [name] [name]. 02/08/2020 1:15 PM Medical Record Number: [id number] Patient Account Number: [id number] Date of Birth/Sex: Treating RN: 1945/09/01 (74 y.o. Female) [name] Primary Care Armondo Cech: [name] Jane Clinician: Referring [name]: Treating [name]: [name] in Treatment: 0 Compression Therapy Performed for Wound Assessment: Wound #2 Right,Medial Lower Leg Performed By: Clinician Jane Hurst, RN Compression Type: Three Layer Post Procedure Diagnosis Same as Pre-procedure Electronic Signature(s) Signed: 02/10/2020 6:13:34 PM By: Jane Hurst RN, BSN Entered By: [name] on 02/08/2020 14:31:25 -------------------------------------------------------------------------------- Encounter Discharge Information Details Patient Name: Date of Service: [name], [name] [name]. 02/08/2020 1:15 PM Medical Record Number: [id number] Patient Account Number: [id number] Date of Birth/Sex: Treating RN: 26-May-1946 (74 y.o. Female) [name] Primary Care Antonius Hartlage: [name] Jane Clinician: Referring [name]: Treating [name]: [name] in  Treatment: 0 Encounter Discharge Information Items Post Procedure Vitals Discharge Condition: Stable Temperature (F): 97.7 Ambulatory Status: Ambulatory Pulse (bpm): 70 Discharge Destination: Home Respiratory Rate (breaths/min): 18 Transportation: Private Auto Blood Pressure (mmHg): 142/73 Accompanied By: self Schedule Follow-up Appointment: Yes Clinical Summary of Care: Patient Declined Electronic Signature(s) Signed: 02/08/2020 6:12:56 PM By: Jane Gouty RN, BSN Entered By: [name] on 02/08/2020 18:04:10 -------------------------------------------------------------------------------- Lower Extremity Assessment Details Patient Name: Date of Service: [name], [place] [name]. 02/08/2020 1:15 PM Medical Record Number: [id number] Patient Account Number: [id number] Date of Birth/Sex: Treating RN: [birth year] (74 y.o. Female) [name] Primary Care Yanisa Goodgame: [name] Jane Clinician: Referring Jaquon Gingerich: Treating Rubina Basinski/Extender: [name] in Treatment: 0 Edema Assessment Assessed: [name]: No] [Right: No] Edema: [Left: Ye] [Right: s] Calf Left: Right: Point of Measurement: 37 cm From Medial Instep cm 35.5 cm Ankle Left: Right: Point of Measurement: 17 cm From Medial Instep cm 26 cm Vascular Assessment Pulses: Dorsalis Pedis Palpable: [Right:Yes] Blood Pressure: Brachial: [Right:142] Ankle: [Right:Dorsalis Pedis: 142 1.00] Electronic Signature(s) Signed: 02/08/2020 6:11:36 PM By: [name] Entered By: [name] on 02/08/2020 13:58:23 -------------------------------------------------------------------------------- Multi Wound Chart Details Patient Name: Date of Service: [name], [name] [name]. 02/08/2020 1:15 PM Medical Record Number: [id number] Patient Account Number: [id number] Date of Birth/Sex: Treating RN: 1946-02-19 (74 y.o. Female) [name] Primary Care Zenith Kercheval: [name] Jane  Clinician: Referring Vegas Fritze: Treating Maven Rosander/Extender: [name] in Treatment: 0 Vital Signs Height(in): 65 Pulse(bpm): 70 Weight(lbs): 142 Blood Pressure(mmHg): 142/73 Body Mass Index(BMI): 24 Temperature(F): 97.6 Respiratory Rate(breaths/min): 19 Photos: [2:No Photos Right, Medial Lower Leg] [N/A:N/A N/A] Wound Location: [2:Trauma] [N/A:N/A] Wounding Event: [2:Trauma, Jane] [N/A:N/A] Primary Etiology: [2:Cataracts, Anemia, Asthma,] [N/A:N/A] Comorbid History: [2:Arrhythmia, Congestive Heart Failure, Hypertension, End Stage Renal Disease, Gout 12/15/2019] [N/A:N/A] Date Acquired: [2:0] [N/A:N/A] Weeks of Treatment: [2:Open] [N/A:N/A] Wound Status: [2:1.1x1.2x1.7] [N/A:N/A] Measurements L x W x D (cm) [2:1.037] [N/A:N/A] A (cm) : rea [2:1.762] [N/A:N/A] Volume (cm) : [2:9] Starting Position 1 (o'clock): [2:6] Ending Position 1 (o'clock): [2:1.6] Maximum Distance 1 (cm): [2:Yes] [N/A:N/A] Undermining: [2:Full Thickness With Exposed Support] [N/A:N/A] Classification: [2:Structures Medium] [N/A:N/A] Exudate A mount: [2:Serosanguineous] [N/A:N/A] Exudate Type: [2:red, brown] [N/A:N/A] Exudate Color: [2:Well defined, not attached] [N/A:N/A] Wound Margin: [2:Medium (34-66%)] [N/A:N/A] Granulation A mount: [2:Pink] [N/A:N/A] Granulation Quality: [2:Medium (34-66%)] [N/A:N/A] Necrotic  A mount: [2:Fat Layer (Subcutaneous Tissue)] [N/A:N/A] Exposed Structures: [2:Exposed: Yes Muscle: Yes Fascia: No Tendon: No Joint: No Bone: No None] [N/A:N/A] Epithelialization: [2:Debridement - Excisional] [N/A:N/A] Debridement: Pre-procedure Verification/Time Out 14:27 [N/A:N/A] Taken: [2:Subcutaneous] [N/A:N/A] Tissue Debrided: [2:Skin/Subcutaneous Tissue] [N/A:N/A] Level: [2:1.32] [N/A:N/A] Debridement A (sq cm): [2:rea Curette] [N/A:N/A] Instrument: [2:Minimum] [N/A:N/A] Bleeding: [2:Pressure] [N/A:N/A] Hemostasis A chieved: [2:0] [N/A:N/A] Procedural  Pain: [2:0] [N/A:N/A] Post Procedural Pain: [2:Procedure was tolerated well] [N/A:N/A] Debridement Treatment Response: [2:1.1x1.2x1.7] [N/A:N/A] Post Debridement Measurements L x W x D (cm) [2:1.762] [N/A:N/A] Post Debridement Volume: (cm) [2:Compression Therapy] [N/A:N/A] Procedures Performed: [2:Debridement] Treatment Notes Electronic Signature(s) Signed: 02/08/2020 6:00:25 PM By: Linton Ham MD Signed: 02/10/2020 6:13:34 PM By: Jane Hurst RN, BSN Entered By: Linton Ham on 02/08/2020 14:46:55 -------------------------------------------------------------------------------- Multi-Disciplinary Care Plan Details Patient Name: Date of Service: [name], [place] [name]. 02/08/2020 1:15 PM Medical Record Number: [id number] Patient Account Number: [id number] Date of Birth/Sex: Treating RN: 1945/09/23 (74 y.o. Female) [name] Primary Care Kairi Harshbarger: [name] Jane Clinician: Referring Khandi Kernes: Treating Cambell Rickenbach/Extender: [name] in Treatment: 0 Active Inactive Abuse / Safety / Falls / Self Care Management Nursing Diagnoses: Potential for falls Potential for injury related to falls Goals: Patient will remain injury free related to falls Date Initiated: 02/08/2020 Target Resolution Date: 03/11/2020 Goal Status: Active Patient/caregiver will verbalize/demonstrate measures taken to prevent injury and/or falls Date Initiated: 02/08/2020 Target Resolution Date: 03/11/2020 Goal Status: Active Interventions: Assess Activities of Daily Living upon admission and as needed Assess fall risk on admission and as needed Assess: immobility, friction, shearing, incontinence upon admission and as needed Assess impairment of mobility on admission and as needed per policy Assess personal safety and home safety (as indicated) on admission and as needed Assess self care needs on admission and as needed Provide education on fall prevention Provide  education on personal and home safety Notes: Wound/Skin Impairment Nursing Diagnoses: Impaired tissue integrity Knowledge deficit related to ulceration/compromised skin integrity Goals: Patient/caregiver will verbalize understanding of skin care regimen Date Initiated: 02/08/2020 Target Resolution Date: 03/11/2020 Goal Status: Active Ulcer/skin breakdown will have a volume reduction of 30% by week 4 Date Initiated: 02/08/2020 Target Resolution Date: 03/11/2020 Goal Status: Active Interventions: Assess patient/caregiver ability to obtain necessary supplies Assess patient/caregiver ability to perform ulcer/skin care regimen upon admission and as needed Assess ulceration(s) every visit Provide education on ulcer and skin care Notes: Electronic Signature(s) Signed: 02/10/2020 6:13:34 PM By: Jane Hurst RN, BSN Entered By: [name] on 02/08/2020 14:26:32 -------------------------------------------------------------------------------- Pain Assessment Details Patient Name: Date of Service: [name], [name] [name]. 02/08/2020 1:15 PM Medical Record Number: [id number] Patient Account Number: [id number] Date of Birth/Sex: Treating RN: 11/28/1945 (74 y.o. Female) [name] Primary Care Divine Hansley: [name] Jane Clinician: Referring Hezakiah Champeau: Treating Ronald Vinsant/Extender: [name] in Treatment: 0 Active Problems Location of Pain Severity and Description of Pain Patient Has Paino No Site Locations Pain Management and Medication Current Pain Management: Electronic Signature(s) Signed: 02/08/2020 6:11:36 PM By: [name] Entered By: [name] on 02/08/2020 14:07:47 -------------------------------------------------------------------------------- Patient/Caregiver Education Details Patient Name: Date of Service: [name] [name]. 7/26/2021andnbsp1:15 PM Medical Record Number: [id number] Patient Account Number: [id number] Date of  Birth/Gender: Treating RN: 1945/12/17 (74 y.o. Female) [name] Primary Care Physician: [name] Jane Clinician: Referring Physician: Treating Physician/Extender: [name] in Treatment: 0 Education Assessment Education Provided To: Patient Education Topics Provided Safety: Methods: Explain/Verbal Responses: State content correctly Wound/Skin Impairment: Methods: Explain/Verbal Responses: State  content correctly Electronic Signature(s) Signed: 02/10/2020 6:13:34 PM By: Jane Hurst RN, BSN Entered By: [name] on 02/08/2020 14:26:44 -------------------------------------------------------------------------------- Wound Assessment Details Patient Name: Date of Service: [name], [name] [name]. 02/08/2020 1:15 PM Medical Record Number: [id number] Patient Account Number: [id number] Date of Birth/Sex: Treating RN: 03-08-1946 (74 y.o. Female) [name] Primary Care Patrece Tallie: [name] Jane Clinician: Referring Kristene Liberati: Treating Loretta Kluender/Extender: [name] in Treatment: 0 Wound Status Wound Number: 2 Primary Trauma, Jane Etiology: Wound Location: Right, Medial Lower Leg Wound Open Wounding Event: Trauma Status: Date Acquired: 12/15/2019 Comorbid Cataracts, Anemia, Asthma, Arrhythmia, Congestive Heart Failure, Weeks Of Treatment: 0 History: Hypertension, End Stage Renal Disease, Gout Clustered Wound: No Photos Photo Uploaded By: Mikeal Hawthorne on 02/10/2020 10:26:03 Wound Measurements Length: (cm) 1.1 Width: (cm) 1.2 Depth: (cm) 1.7 Area: (cm) 1.037 Volume: (cm) 1.762 % Reduction in Area: % Reduction in Volume: Epithelialization: None Tunneling: No Undermining: Yes Starting Position (o'clock): 9 Ending Position (o'clock): 6 Maximum Distance: (cm) 1.6 Wound Description Classification: Full Thickness With Exposed Support Struct Wound Margin: Well defined, not  attached Exudate Amount: Medium Exudate Type: Serosanguineous Exudate Color: red, brown ures Foul Odor After Cleansing: No Slough/Fibrino Yes Wound Bed Granulation Amount: Medium (34-66%) Exposed Structure Granulation Quality: Pink Fascia Exposed: No Necrotic Amount: Medium (34-66%) Fat Layer (Subcutaneous Tissue) Exposed: Yes Necrotic Quality: Adherent Slough Tendon Exposed: No Muscle Exposed: Yes Necrosis of Muscle: No Joint Exposed: No Bone Exposed: No Treatment Notes Wound #2 (Right, Medial Lower Leg) 2. Periwound Care Moisturizing lotion 3. Primary Dressing Applied Colle[employer] Jane primary dressing (specifiy in notes) 4. Secondary Dressing ABD Pad Dry Gauze 6. Support Layer Applied 3 layer compression wrap Notes saline moistened gauze packing behind collagen Electronic Signature(s) Signed: 02/08/2020 6:11:36 PM By: [name] Entered By: [name] on 02/08/2020 14:07:37 -------------------------------------------------------------------------------- Vitals Details Patient Name: Date of Service: [name], [name] [name]. 02/08/2020 1:15 PM Medical Record Number: [id number] Patient Account Number: [id number] Date of Birth/Sex: Treating RN: 04/25/1946 (74 y.o. Female) [name] Primary Care Dorthula Bier: [name] Jane Clinician: Referring Jeremy Mclamb: Treating Deyanira Fesler/Extender: [name] in Treatment: 0 Vital Signs Time Taken: 13:25 Temperature (F): 97.6 Height (in): 65 Pulse (bpm): 70 Source: Stated Respiratory Rate (breaths/min): 19 Weight (lbs): 142 Blood Pressure (mmHg): 142/73 Source: Stated Reference Range: 80 - 120 mg / dl Body Mass Index (BMI): 23.6 Electronic Signature(s) Signed: 02/08/2020 6:11:36 PM By: [name] Entered By: [name] on 02/08/2020 13:39:06

## 2020-02-10 NOTE — Progress Notes (Signed)
[name], [name] ([id number]) . Visit Report for 02/08/2020 Chief Complaint Document Details Patient Name: Date of Service: [name] [name]. 02/08/2020 1:15 PM Medical Record Number: [id number] Patient Account Number: [id number] Date of Birth/Sex: Treating RN: 1945/10/18 (74 y.o. Female) Levan Hurst Primary Care Provider: Wilfred Lacy Other Clinician: Referring Provider: Treating Provider/Extender: Alvira Monday in Treatment: 0 Information Obtained from: Patient Chief Complaint 02/08/2020; patient is here for a wound on the right anterior lower leg Electronic Signature(s) Signed: 02/08/2020 6:00:25 PM By: Linton Ham MD Entered By: Linton Ham on 02/08/2020 14:47:50 -------------------------------------------------------------------------------- Debridement Details Patient Name: Date of Service: [name], [name] [name]. 02/08/2020 1:15 PM Medical Record Number: [id number] Patient Account Number: [id number] Date of Birth/Sex: Treating RN: [birth year] (74 y.o. Female) Levan Hurst Primary Care Provider: Wilfred Lacy Other Clinician: Referring Provider: Treating Provider/Extender: Alvira Monday in Treatment: 0 Debridement Performed for Assessment: Wound #2 Right,Medial Lower Leg Performed By: Physician Ricard Dillon., MD Debridement Type: Debridement Level of Consciousness (Pre-procedure): Awake and Alert Pre-procedure Verification/Time Out Yes - 14:27 Taken: Start Time: 14:27 [name] Area Debrided (L x W): otal 1.1 (cm) x 1.2 (cm) = 1.32 (cm) Tissue and other material debrided: Viable, Non-Viable, Subcutaneous Level: Skin/Subcutaneous Tissue Debridement Description: Excisional Instrument: Curette Bleeding: Minimum Hemostasis Achieved: Pressure End Time: 14:28 Procedural Pain: 0 Post Procedural Pain: 0 Response to Treatment: Procedure was tolerated well Level of Consciousness (Post- Awake and Alert procedure): Post  Debridement Measurements of Total Wound Length: (cm) 1.1 Width: (cm) 1.2 Depth: (cm) 1.7 Volume: (cm) 1.762 Character of Wound/Ulcer Post Debridement: Improved Post Procedure Diagnosis Same as Pre-procedure Electronic Signature(s) Signed: 02/08/2020 6:00:25 PM By: Linton Ham MD Signed: 02/10/2020 6:13:34 PM By: Levan Hurst RN, BSN Entered By: Linton Ham on 02/08/2020 14:47:24 -------------------------------------------------------------------------------- HPI Details Patient Name: Date of Service: [name], [name] [name]. 02/08/2020 1:15 PM Medical Record Number: [id number] Patient Account Number: [id number] Date of Birth/Sex: Treating RN: 12-02-1945 (74 y.o. Female) Levan Hurst Primary Care Provider: Wilfred Lacy Other Clinician: Referring Provider: Treating Provider/Extender: Alvira Monday in Treatment: 0 History of Present Illness HPI Description: 02/08/2020; this is a 74 year old woman who hit her leg when frozen checks and fell out of the freezer. By her description she had a classic swelling without opening and compatible with a hematoma. She developed tissue skin necrosis over the hematoma had drainage out of it. Nevertheless I do not think she was aggressive in getting medical attention. In any case she eventually saw her primary doctor. She had 10 weight 10 days of Keflex which did not help. She again saw her primary physician on 7/15. She was referred here. She came in without anything on the wound. Past medical history includes asthma, depression, paroxysmal atrial fib status post AV nodal ablation and pacemaker, renal transplant, intracranial hemorrhage, cardiomyopathy congestive heart failure IVC filter.Marland Kitchen ABI in our clinic was 1.0 Electronic Signature(s) Signed: 02/08/2020 6:00:25 PM By: Linton Ham MD Entered By: Linton Ham on 02/08/2020  14:56:19 -------------------------------------------------------------------------------- Physical Exam Details Patient Name: Date of Service: [name], [name] [name]. 02/08/2020 1:15 PM Medical Record Number: [id number] Patient Account Number: [id number] Date of Birth/Sex: Treating RN: 12/05/1945 (74 y.o. Female) Levan Hurst Primary Care Provider: Wilfred Lacy Other Clinician: Referring Provider: Treating Provider/Extender: Alvira Monday in Treatment: 0 Constitutional Sitting or standing Blood Pressure is within target range for patient.. Pulse regular and within target range for patient.Marland Kitchen Respirations regular, non-labored and within target range.Marland Kitchen  Temperature is normal and within the target range for the patient.Marland Kitchen Appears in no distress. Respiratory work of breathing is normal. Cardiovascular Pedal pulses are palpable. Integumentary (Hair, Skin) There is erythema around the wound minimal tenderness.Marland Kitchen Psychiatric appears at normal baseline. Notes Wound exam; the patient has a small wound on the right anterior tibial area. However this has depth and undermining. Using a #3 curette I removed large amounts of nonviable subcutaneous tissue. Hemostasis with direct pressure. There is no evidence of surrounding infection, pedal pulses are palpable. There is minimal erythema around the wound and minimal tenderness. I do not believe there is active infection here. Electronic Signature(s) Signed: 02/08/2020 6:00:25 PM By: Linton Ham MD Entered By: Linton Ham on 02/08/2020 14:57:52 -------------------------------------------------------------------------------- Physician Orders Details Patient Name: Date of Service: [name], [name] [name]. 02/08/2020 1:15 PM Medical Record Number: [id number] Patient Account Number: [id number] Date of Birth/Sex: Treating RN: 11-Feb-1946 (74 y.o. Female) Levan Hurst Primary Care Provider: Wilfred Lacy Other  Clinician: Referring Provider: Treating Provider/Extender: Alvira Monday in Treatment: 0 Verbal / Phone Orders: No Diagnosis Coding Follow-up Appointments Return Appointment in 1 week. Dressing Change Frequency Wound #2 Right,Medial Lower Leg Do not change entire dressing for one week. Skin Barriers/Peri-Wound Care Moisturizing lotion Wound Cleansing May shower with protection. - use cast protector Primary Wound Dressing Wound #2 Right,Medial Lower Leg Silver Collagen Secondary Dressing Wound #2 Right,Medial Lower Leg Dry Gauze ABD pad Edema Control 3 Layer Compression System - Right Lower Extremity Avoid standing for long periods of time Elevate legs to the level of the heart or above for 30 minutes daily and/or when sitting, a frequency of: - throughout the day Exercise regularly Electronic Signature(s) Signed: 02/08/2020 6:00:25 PM By: Linton Ham MD Signed: 02/10/2020 6:13:34 PM By: Levan Hurst RN, BSN Entered By: Levan Hurst on 02/08/2020 14:30:24 -------------------------------------------------------------------------------- Problem List Details Patient Name: Date of Service: [name], [name] [name]. 02/08/2020 1:15 PM Medical Record Number: [id number] Patient Account Number: [id number] Date of Birth/Sex: Treating RN: 03-16-1946 (74 y.o. 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 02/08/2020 1:15 PM Medical Record Number: [id number] Patient Account Number: [id number] Date of Birth/Sex: Treating RN: September 30, 1945 (74 y.o. Female) Levan Hurst Primary Care Provider: Wilfred Lacy Other Clinician: Referring Provider: Treating Provider/Extender: Alvira Monday in Treatment: 0 Subjective Chief Complaint Information obtained from Patient 02/08/2020; patient is here for a wound on the right anterior lower leg History of Present Illness (HPI) 02/08/2020; this is a 74 year old woman who hit her leg when frozen checks and fell out of the freezer. By her description she had a classic swelling without opening and compatible with a hematoma. She developed tissue skin necrosis over the hematoma had drainage out of it. Nevertheless I do not think she was aggressive in getting medical attention. In any case she eventually saw her primary doctor. She had 10 weight 10 days of Keflex which did not help. She again saw her primary physician on  7/15. She was referred here. She came in without anything on the wound. Past medical history includes asthma, depression, paroxysmal atrial fib status post AV nodal ablation and pacemaker, renal transplant, intracranial hemorrhage, cardiomyopathy congestive heart failure IVC filter.Marland Kitchen ABI in our clinic was 1.0 Patient History Allergies ACE Inhibitors, amiodarone, warfarin (Reaction: caused stroke), warfarin sodium (Reaction: caused stroke) Family History Cancer - Father, Diabetes - Mother, Hypertension - Mother,Siblings, No family history of Heart Disease, Hereditary Spherocytosis, Kidney Disease, Lung Disease, Seizures, Stroke, Thyroid Problems, Tuberculosis. Social History Never smoker, Marital Status - Married, Alcohol Use - Never, Drug Use - No History, Caffeine  Use - Daily. Medical History Eyes Patient has history of Cataracts Ear/Nose/Mouth/Throat Denies history of Chronic sinus problems/congestion, Middle ear problems Hematologic/Lymphatic Patient has history of Anemia Denies history of Hemophilia, Human Immunodeficiency Virus, Lymphedema, Sickle Cell Disease Respiratory Patient has history of Asthma Denies history of Aspiration, Chronic Obstructive Pulmonary Disease (COPD), Pneumothorax, Sleep Apnea, Tuberculosis Cardiovascular Patient has history of Arrhythmia - afib, flutter, Congestive Heart Failure, Hypertension Gastrointestinal Denies history of Cirrhosis , Colitis, Crohnoos, Hepatitis A, Hepatitis B, Hepatitis C Endocrine Denies history of Type I Diabetes, Type II Diabetes Genitourinary Patient has history of End Stage Renal Disease Immunological Denies history of Lupus Erythematosus, Raynaudoos, Scleroderma Integumentary (Skin) Denies history of History of Burn Musculoskeletal Patient has history of Gout Neurologic Denies history of Dementia, Neuropathy, Quadriplegia, Paraplegia, Seizure Disorder Oncologic Denies history of Received Chemotherapy, Received Radiation Psychiatric Denies history of Anorexia/bulimia, Confinement Anxiety Hospitalization/Surgery History - av fistula placement, 2013. - cardiac cath/valve sx, 1960. - kidney transplant, 2017. Medical A Surgical History Notes nd Respiratory pneumonia Cardiovascular stroke open heart sx DVT Genitourinary Kidney transplant in 2019, was on dialysis prior to. Integumentary (Skin) eczema Review of Systems (ROS) Constitutional Symptoms (General Health) Denies complaints or symptoms of Fatigue, Fever, Chills, Marked Weight Change. Eyes Complains or has symptoms of Glasses / Contacts - glasses. Ear/Nose/Mouth/Throat Denies complaints or symptoms of Chronic sinus problems or rhinitis. Respiratory Denies complaints or symptoms of Chronic or frequent coughs,  Shortness of Breath. Cardiovascular Denies complaints or symptoms of Chest pain. Gastrointestinal Denies complaints or symptoms of Frequent diarrhea, Nausea, Vomiting. Endocrine Denies complaints or symptoms of Heat/cold intolerance. Genitourinary Denies complaints or symptoms of Frequent urination. Integumentary (Skin) Complains or has symptoms of Wounds, right lower leg Musculoskeletal Denies complaints or symptoms of Muscle Pain, Muscle Weakness. Neurologic Denies complaints or symptoms of Numbness/parasthesias. Psychiatric Denies complaints or symptoms of Claustrophobia, Suicidal. Objective Constitutional Sitting or standing Blood Pressure is within target range for patient.. Pulse regular and within target range for patient.Marland Kitchen Respirations regular, non-labored and within target range.. Temperature is normal and within the target range for the patient.Marland Kitchen Appears in no distress. Vitals Time Taken: 1:25 PM, Height: 65 in, Source: Stated, Weight: 142 lbs, Source: Stated, BMI: 23.6, Temperature: 97.6 F, Pulse: 70 bpm, Respiratory Rate: 19 breaths/min, Blood Pressure: 142/73 mmHg. Respiratory work of breathing is normal. Cardiovascular Pedal pulses are palpable. Psychiatric appears at normal baseline. General Notes: Wound exam; the patient has a small wound on the right anterior tibial area. However this has depth and undermining. Using a #3 curette I removed large amounts of nonviable subcutaneous tissue. Hemostasis with direct pressure. There is no evidence of surrounding infection, pedal pulses are palpable. There is minimal erythema around the wound and minimal tenderness. I do not believe there is active infection here. Integumentary (Hair, Skin) There is erythema around the wound minimal tenderness.. Wound #2 status is Open. Original  cause of wound was Trauma. The wound is located on the Right,Medial Lower Leg. The wound measures 1.1cm length x 1.2cm width x 1.7cm depth;  1.037cm^2 area and 1.762cm^3 volume. There is muscle and Fat Layer (Subcutaneous Tissue) Exposed exposed. There is no tunneling noted, however, there is undermining starting at 9:00 and ending at 6:00 with a maximum distance of 1.6cm. There is a medium amount of serosanguineous drainage noted. The wound margin is well defined and not attached to the wound base. There is medium (34-66%) pink granulation within the wound bed. There is a medium (34-66%) amount of necrotic tissue within the wound bed including Adherent Slough. 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 There was a Excisional Skin/Subcutaneous Tissue Debridement with a total area of 1.32 sq cm performed by Ricard Dillon., MD. With the following instrument(s): Curette to remove Viable and Non-Viable tissue/material. Material removed includes Subcutaneous Tissue. No specimens were taken. A time out was conducted at 14:27, prior to the start of the procedure. A Minimum amount of bleeding was controlled with Pressure. The procedure was tolerated well with a pain level of 0 throughout and a pain level of 0 following the procedure. Post Debridement Measurements: 1.1cm length x 1.2cm width x 1.7cm depth; 1.762cm^3 volume. Character of Wound/Ulcer Post Debridement is improved. Post procedure Diagnosis Wound #2: Same as Pre-Procedure Pre-procedure diagnosis of Wound #2 is a Trauma, Other located on the Right,Medial Lower Leg . There was a Three Layer Compression Therapy Procedure by Levan Hurst, RN. Post procedure Diagnosis Wound #2: Same as Pre-Procedure Plan Follow-up Appointments: Return Appointment in 1 week. Dressing Change Frequency: Wound #2 Right,Medial Lower Leg: Do not change entire  dressing for one week. Skin Barriers/Peri-Wound Care: Moisturizing lotion Wound Cleansing: May shower with protection. - use cast protector Primary Wound Dressing: Wound #2 Right,Medial Lower Leg: Silver Collagen Secondary Dressing: Wound #2 Right,Medial Lower Leg: Dry Gauze ABD pad Edema Control: 3 Layer Compression System - Right Lower Extremity Avoid standing for long periods of time Elevate legs to the level of the heart or above for 30 minutes daily and/or when sitting, a frequency of: - throughout the day Exercise regularly 1. Silver collagen packed into the wound with hydrogel, ABDs 3 layer compression 2. The patient will leave this on all week. 3. I saw no reason for additional antibiotics. 4. No evidence of arterial insufficiency. 5. I think this patient would be best served by a wound VAC. We will put a snap VAC through her insurance this might give [name] the best chance of getting this to fill-in. Electronic Signature(s) Signed: 02/08/2020 6:00:25 PM By: Linton Ham MD Entered By: Linton Ham on 02/08/2020 14:59:13 -------------------------------------------------------------------------------- HxROS Details Patient Name: Date of Service: [name], [name] [name]. 02/08/2020 1:15 PM Medical Record Number: [id number] Patient Account Number: [id number] Date of Birth/Sex: Treating RN: 10/20/1945 (74 y.o. Female) Kela Millin Primary Care Provider: Wilfred Lacy Other Clinician: Referring Provider: Treating Provider/Extender: Alvira Monday in Treatment: 0 Constitutional Symptoms (General Health) Complaints and Symptoms: Negative for: Fatigue; Fever; Chills; Marked Weight Change Eyes Complaints and Symptoms: Positive for: Glasses / Contacts - glasses Medical History: Positive for: Cataracts Ear/Nose/Mouth/Throat Complaints and Symptoms: Negative for: Chronic sinus problems or rhinitis Medical History: Negative for: Chronic sinus  problems/congestion; Middle  ear problems Respiratory Complaints and Symptoms: Negative for: Chronic or frequent coughs; Shortness of Breath Medical History: Positive for: Asthma Negative for: Aspiration; Chronic Obstructive Pulmonary Disease (COPD); Pneumothorax; Sleep Apnea; Tuberculosis Past Medical History Notes: pneumonia Cardiovascular Complaints and Symptoms: Negative for: Chest pain Medical History: Positive for: Arrhythmia - afib, flutter; Congestive Heart Failure; Hypertension Past Medical History Notes: stroke open heart sx DVT Gastrointestinal Complaints and Symptoms: Negative for: Frequent diarrhea; Nausea; Vomiting Medical History: Negative for: Cirrhosis ; Colitis; Crohns; Hepatitis A; Hepatitis B; Hepatitis C Endocrine Complaints and Symptoms: Negative for: Heat/cold intolerance Medical History: Negative for: Type I Diabetes; Type II Diabetes Genitourinary Complaints and Symptoms: Negative for: Frequent urination Medical History: Positive for: End Stage Renal Disease Past Medical History Notes: Kidney transplant in 2019, was on dialysis prior to. Integumentary (Skin) Complaints and Symptoms: Positive for: Wounds Review of System Notes: right lower leg Medical History: Negative for: History of Burn Past Medical History Notes: eczema Musculoskeletal Complaints and Symptoms: Negative for: Muscle Pain; Muscle Weakness Medical History: Positive for: Gout Neurologic Complaints and Symptoms: Negative for: Numbness/parasthesias Medical History: Negative for: Dementia; Neuropathy; Quadriplegia; Paraplegia; Seizure Disorder Psychiatric Complaints and Symptoms: Negative for: Claustrophobia; Suicidal Medical History: Negative for: Anorexia/bulimia; Confinement Anxiety Hematologic/Lymphatic Medical History: Positive for: Anemia Negative for: Hemophilia; Human Immunodeficiency Virus; Lymphedema; Sickle Cell Disease Immunological Medical  History: Negative for: Lupus Erythematosus; Raynauds; Scleroderma Oncologic Medical History: Negative for: Received Chemotherapy; Received Radiation HBO Extended History Items Eyes: Cataracts Immunizations Pneumococcal Vaccine: Received Pneumococcal Vaccination: Yes Implantable Devices Yes Hospitalization / Surgery History Type of Hospitalization/Surgery av fistula placement, 2013 cardiac cath/valve sx, 1960 kidney transplant, 2017 Family and Social History Cancer: Yes - Father; Diabetes: Yes - Mother; Heart Disease: No; Hereditary Spherocytosis: No; Hypertension: Yes - Mother,Siblings; Kidney Disease: No; Lung Disease: No; Seizures: No; Stroke: No; Thyroid Problems: No; Tuberculosis: No; Never smoker; Marital Status - Married; Alcohol Use: Never; Drug Use: No History; Caffeine Use: Daily; Financial Concerns: No; Food, Clothing or Shelter Needs: No; Support System Lacking: No; Transportation Concerns: No Electronic Signature(s) Signed: 02/08/2020 6:00:25 PM By: Linton Ham MD Signed: 02/08/2020 6:11:36 PM By: Kela Millin Entered By: Kela Millin on 02/08/2020 13:49:54 -------------------------------------------------------------------------------- SuperBill Details Patient Name: Date of Service: [name], [name] [name]. 02/08/2020 Medical Record Number: [id number] Patient Account Number: [id number] Date of Birth/Sex: Treating RN: 1945-08-19 (74 y.o. 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 of mu Modifier: scle Quantity: 1 Physician Procedures : CPT4 Code Description Modifier [id number] WC PHYS LEVEL 3 NEW PT 25 ICD-10 Diagnosis Description S80.11XD Contusion of right lower leg, subsequent encounter 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

## 2020-02-12 ENCOUNTER — Ambulatory Visit: Payer: Self-pay | Admitting: *Deleted

## 2020-02-12 ENCOUNTER — Other Ambulatory Visit: Payer: Self-pay | Admitting: *Deleted

## 2020-02-12 NOTE — Patient Outreach (Signed)
[facility]) Care Management  02/12/2020  [name] October 08, 1945 [id number]   LATE ENTRY  CSW attempted a final outreach call/attempt on 02/08/2020 and left a voice message for return call.  CSW will close CSW referral at this time; per policy. CSW will advise [facility] team and PCP of above.   Advance Directive packet was mailed to pt.   Eduard Clos, MSW, [place] Worker  [place] [phone]

## 2020-02-15 ENCOUNTER — Ambulatory Visit (INDEPENDENT_AMBULATORY_CARE_PROVIDER_SITE_OTHER): Payer: Medicare Other | Admitting: Psychology

## 2020-02-15 ENCOUNTER — Ambulatory Visit: Payer: Self-pay | Admitting: *Deleted

## 2020-02-15 DIAGNOSIS — F4322 Adjustment disorder with anxiety: Secondary | ICD-10-CM | POA: Diagnosis not present

## 2020-02-17 DIAGNOSIS — N189 Chronic kidney disease, unspecified: Secondary | ICD-10-CM | POA: Diagnosis not present

## 2020-02-17 DIAGNOSIS — R809 Proteinuria, unspecified: Secondary | ICD-10-CM | POA: Diagnosis not present

## 2020-02-17 DIAGNOSIS — I0981 Rheumatic heart failure: Secondary | ICD-10-CM | POA: Diagnosis not present

## 2020-02-17 DIAGNOSIS — N184 Chronic kidney disease, stage 4 (severe): Secondary | ICD-10-CM | POA: Diagnosis not present

## 2020-02-17 DIAGNOSIS — N2581 Secondary hyperparathyroidism of renal origin: Secondary | ICD-10-CM | POA: Diagnosis not present

## 2020-02-17 DIAGNOSIS — Z79899 Other long term (current) drug therapy: Secondary | ICD-10-CM | POA: Diagnosis not present

## 2020-02-17 DIAGNOSIS — I5032 Chronic diastolic (congestive) heart failure: Secondary | ICD-10-CM | POA: Diagnosis not present

## 2020-02-17 DIAGNOSIS — I129 Hypertensive chronic kidney disease with stage 1 through stage 4 chronic kidney disease, or unspecified chronic kidney disease: Secondary | ICD-10-CM | POA: Diagnosis not present

## 2020-02-17 DIAGNOSIS — Z940 Kidney transplant status: Secondary | ICD-10-CM | POA: Diagnosis not present

## 2020-02-17 DIAGNOSIS — N183 Chronic kidney disease, stage 3 unspecified: Secondary | ICD-10-CM | POA: Diagnosis not present

## 2020-02-17 DIAGNOSIS — I770 Arteriovenous fistula, acquired: Secondary | ICD-10-CM | POA: Diagnosis not present

## 2020-02-17 DIAGNOSIS — D849 Immunodeficiency, unspecified: Secondary | ICD-10-CM | POA: Diagnosis not present

## 2020-02-17 DIAGNOSIS — D631 Anemia in chronic kidney disease: Secondary | ICD-10-CM | POA: Diagnosis not present

## 2020-02-17 DIAGNOSIS — N2589 Other disorders resulting from impaired renal tubular function: Secondary | ICD-10-CM | POA: Diagnosis not present

## 2020-02-17 DIAGNOSIS — D500 Iron deficiency anemia secondary to blood loss (chronic): Secondary | ICD-10-CM | POA: Diagnosis not present

## 2020-02-17 DIAGNOSIS — M109 Gout, unspecified: Secondary | ICD-10-CM | POA: Diagnosis not present

## 2020-02-17 DIAGNOSIS — I130 Hypertensive heart and chronic kidney disease with heart failure and stage 1 through stage 4 chronic kidney disease, or unspecified chronic kidney disease: Secondary | ICD-10-CM | POA: Diagnosis not present

## 2020-02-18 ENCOUNTER — Encounter (HOSPITAL_BASED_OUTPATIENT_CLINIC_OR_DEPARTMENT_OTHER): Payer: Medicare Other | Attending: Physician Assistant | Admitting: Physician Assistant

## 2020-02-18 DIAGNOSIS — I132 Hypertensive heart and chronic kidney disease with heart failure and with stage 5 chronic kidney disease, or end stage renal disease: Secondary | ICD-10-CM | POA: Insufficient documentation

## 2020-02-18 DIAGNOSIS — I509 Heart failure, unspecified: Secondary | ICD-10-CM | POA: Insufficient documentation

## 2020-02-18 DIAGNOSIS — L97813 Non-pressure chronic ulcer of other part of right lower leg with necrosis of muscle: Secondary | ICD-10-CM | POA: Diagnosis not present

## 2020-02-18 DIAGNOSIS — Z940 Kidney transplant status: Secondary | ICD-10-CM | POA: Diagnosis not present

## 2020-02-18 DIAGNOSIS — J45909 Unspecified asthma, uncomplicated: Secondary | ICD-10-CM | POA: Insufficient documentation

## 2020-02-18 DIAGNOSIS — W228XXD Striking against or struck by other objects, subsequent encounter: Secondary | ICD-10-CM | POA: Insufficient documentation

## 2020-02-18 DIAGNOSIS — S8011XD Contusion of right lower leg, subsequent encounter: Secondary | ICD-10-CM | POA: Insufficient documentation

## 2020-02-18 DIAGNOSIS — I480 Paroxysmal atrial fibrillation: Secondary | ICD-10-CM | POA: Insufficient documentation

## 2020-02-18 DIAGNOSIS — L97812 Non-pressure chronic ulcer of other part of right lower leg with fat layer exposed: Secondary | ICD-10-CM | POA: Diagnosis not present

## 2020-02-18 NOTE — Progress Notes (Addendum)
[name] ([id number]) . Visit Report for 02/18/2020 Chief Complaint Document Details Patient Name: Date of Service: [name] [name]. 02/18/2020 10:00 A M Medical Record Number: [id number] Patient Account Number: [id number] Date of Birth/Sex: Treating RN: Sep 06, 1945 (74 y.o. [name] Primary Care Provider: Wilfred Lacy Other Clinician: Referring Provider: Treating Provider/Extender: Heber [place] in Treatment: 1 Information Obtained from: Patient Chief Complaint 02/08/2020; patient is here for a wound on the right anterior lower leg Electronic Signature(s) Signed: 02/18/2020 10:08:13 AM By: Worthy Keeler PA-C Entered By: Worthy Keeler on 02/18/2020 10:08:13 -------------------------------------------------------------------------------- HPI Details Patient Name: Date of Service: [name], [name] [name]. 02/18/2020 10:00 [place] Record Number: [id number] Patient Account Number: [id number] Date of Birth/Sex: Treating RN: 1945-09-10 (74 y.o. [name] Primary Care Provider: Wilfred Lacy Other Clinician: Referring Provider: Treating Provider/Extender: Heber [place] in Treatment: 1 History of Present Illness HPI Description: 02/08/2020; this is a 74 year old woman who hit her leg when frozen checks and fell out of the freezer. By her description she had a classic swelling without opening and compatible with a hematoma. She developed tissue skin necrosis over the hematoma had drainage out of it. Nevertheless I do not think she was aggressive in getting medical attention. In any case she eventually saw her primary doctor. She had 10 weight 10 days of Keflex which did not help. She again saw her primary physician on 7/15. She was referred here. She came in without anything on the wound. Past medical history includes asthma, depression, paroxysmal atrial fib status post AV nodal ablation and pacemaker, renal transplant,  intracranial hemorrhage, cardiomyopathy congestive heart failure IVC filter.Marland Kitchen ABI in our clinic was 1.0 02/18/2020 upon evaluation today patient appears to be doing a little better in regard to her wound. This is a bit clearer obviously than last time. With that being said we did get approval for the snap VAC I think that is a good option for [name] to initiate today. Electronic Signature(s) Signed: 02/18/2020 11:31:45 AM By: Worthy Keeler PA-C Entered By: Worthy Keeler on 02/18/2020 11:31:44 -------------------------------------------------------------------------------- Physical Exam Details Patient Name: Date of Service: [name], [name] [name]. 02/18/2020 10:00 A M Medical Record Number: [id number] Patient Account Number: [id number] Date of Birth/Sex: Treating RN: [birth year] (74 y.o. [name] Primary Care Provider: Wilfred Lacy Other Clinician: Referring Provider: Treating Provider/Extender: Heber [place] in Treatment: 1 Constitutional Well-nourished and well-hydrated in no acute distress. Respiratory normal breathing without difficulty. Psychiatric this patient is able to make decisions and demonstrates good insight into disease process. Alert and Oriented x 3. pleasant and cooperative. Notes Upon inspection patient's wound bed actually showed signs of good granulation at this time there does not appear to be evidence of active infection which is great news and overall very pleased with where things stand. No fevers, chills, nausea, vomiting, or diarrhea. I think she is an excellent candidate to go ahead and initiate treatment with the snap VAC. Electronic Signature(s) Signed: 02/18/2020 11:33:11 AM By: Worthy Keeler PA-C Entered By: Worthy Keeler on 02/18/2020 11:33:11 -------------------------------------------------------------------------------- Physician Orders Details Patient Name: Date of Service: [name], [name] [name]. 02/18/2020 10:00 A M Medical  Record Number: [id number] Patient Account Number: [id number] Date of Birth/Sex: Treating RN: 02-08-1946 (74 y.o. 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 Dressing Change Frequency Wound #2 Right,Medial Lower Leg Do not change entire dressing for one week. Skin Barriers/Peri-Wound Care Skin Prep Moisturizing lotion Wound Cleansing May shower with protection. - use cast protector Primary Wound Dressing Wound #2 Right,Medial Lower Leg Other: - SNAP vac Negative Presssure Wound Therapy SNAP Vac to wound continuously at 169mm/hg pressure Edema Control 3 Layer Compression System - Right Lower Extremity Avoid standing for long periods of time Elevate legs to the level of the heart or above for 30 minutes daily and/or when sitting, a frequency of: - throughout the day Exercise regularly Electronic Signature(s) Signed: 02/18/2020 5:21:32 PM By: Levan Hurst RN, BSN Signed: 02/18/2020 6:02:08 PM By: Worthy Keeler PA-C Entered By: Levan Hurst on 02/18/2020 11:31:37 -------------------------------------------------------------------------------- Problem List Details Patient Name: Date of Service: [name], [name] [name]. 02/18/2020 10:00 A M Medical Record Number: [id number] Patient Account Number: [id number] Date of Birth/Sex: Treating RN: March 10, 1946 (74 y.o. 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 02/18/2020 10:00 A M Medical Record Number: [id number] Patient Account Number: [id number] Date of Birth/Sex: Treating RN: 1945-07-28 (74 y.o. [name] Primary Care Provider: Wilfred Lacy Other Clinician: Referring Provider: Treating Provider/Extender: Heber [place] in Treatment: 1 Subjective Chief Complaint Information obtained from Patient 02/08/2020; patient is here for a wound on the right anterior lower leg History of Present Illness (HPI) 02/08/2020; this is a 74 year old woman who hit her leg when frozen checks and fell out of the freezer. By her description she had a classic swelling without opening and compatible with a hematoma. She developed tissue skin necrosis over the hematoma had drainage out of it. Nevertheless I do not think she was aggressive in getting medical attention. In any case she eventually saw her primary doctor. She had 10 weight 10 days of Keflex which did not help. She again saw her primary physician on 7/15. She was referred here. She came in without anything on the wound. Past medical history includes asthma, depression, paroxysmal atrial fib status post AV nodal ablation and pacemaker, renal transplant, intracranial hemorrhage, cardiomyopathy congestive heart failure IVC filter.Marland Kitchen ABI in our clinic was 1.0 02/18/2020 upon evaluation today patient appears to be doing a little better in regard  to her wound. This is a bit clearer obviously than last time. With that being said we did get approval for the snap VAC I think that is a good option for [name] to initiate today. Objective Constitutional Well-nourished and well-hydrated in no acute distress. Vitals Time Taken: 10:49 AM, Height: 65 in, Weight: 142 lbs, BMI: 23.6, Temperature: 97.6 F, Pulse: 96 bpm, Respiratory Rate: 19 breaths/min, Blood Pressure: 148/79 mmHg. Respiratory normal breathing without difficulty. Psychiatric this patient is able to make decisions  and demonstrates good insight into disease process. Alert and Oriented x 3. pleasant and cooperative. General Notes: Upon inspection patient's wound bed actually showed signs of good granulation at this time there does not appear to be evidence of active infection which is great news and overall very pleased with where things stand. No fevers, chills, nausea, vomiting, or diarrhea. I think she is an excellent candidate to go ahead and initiate treatment with the snap VAC. Integumentary (Hair, Skin) Wound #2 status is Open. Original cause of wound was Trauma. The wound is located on the Right,Medial Lower Leg. The wound measures 1.2cm length x 1.3cm width x 1.2cm depth; 1.225cm^2 area and 1.47cm^3 volume. There is muscle and Fat Layer (Subcutaneous Tissue) Exposed exposed. There is no tunneling noted, however, there is undermining starting at 9:00 and ending at 1:00 with a maximum distance of 1.5cm. There is a medium amount of serosanguineous drainage noted. The wound margin is well defined and not attached to the wound base. There is medium (34-66%) pink granulation within the wound bed. There is a medium (34-66%) amount of necrotic tissue within the wound bed including Adherent Slough. 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 There was a Three Layer Compression Therapy Procedure by Levan Hurst, RN. Post procedure Diagnosis Wound #2: Same as Pre-Procedure Plan Follow-up Appointments: Return Appointment in 1 week. Dressing Change Frequency: Wound #2 Right,Medial Lower Leg: Do not change entire dressing for one week. Skin Barriers/Peri-Wound Care: Skin Prep Moisturizing lotion Wound Cleansing: May shower with protection. - use cast protector Primary Wound Dressing: Wound #2 Right,Medial Lower Leg: Other: - SNAP vac Negative Presssure Wound Therapy: SNAP Vac to wound continuously at 157mm/hg pressure Edema Control: 3 Layer Compression System - Right Lower Extremity Avoid standing for long periods of time Elevate legs to the level of the heart or above for 30 minutes daily and/or when sitting, a frequency of: - throughout the day Exercise regularly 1. I would recommend currently that we go ahead and continue with the wound care measures as before with the 3 layer compression wrap which I think is still a good option for her. 2. I am also can recommend that we go ahead and have the patient continue to be monitored for any signs of flexion or worsening although I do believe the snap VAC is appropriate to go ahead and initiate as I see no evidence of infection at this time. 3. I am also can recommend that we go ahead and have the patient elevate her leg is much as possible to try to keep edema under good control though I think she can ambulate and exercise regularly without any concerns or limitations there. We will see patient back for reevaluation in 1 week here in the clinic. If anything worsens or changes patient will contact our office for additional recommendations. Electronic Signature(s) Signed: 02/18/2020 11:34:11 AM By: Worthy Keeler PA-C Entered By: Worthy Keeler on 02/18/2020  11:34:11 -------------------------------------------------------------------------------- SuperBill Details Patient Name: Date of Service: [name], [name] [name]. 02/18/2020 Medical Record Number: [id number] Patient Account Number: [id number] Date of Birth/Sex: Treating RN: [birth year] (74 y.o. 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 Facility Procedures CPT4 Code: 60045997 Description: 74142 NEG PRESS WND TX <=50 SQ CM Modifier: Quantity: 1 CPT4 Code: 39532023 Description: (Facility Use Only) [facility] COMPRS LWR RT LEG Modifier: 59 Quantity: 1 Physician Procedures : CPT4 Code Description Modifier [id number] - WC PHYS LEVEL 4 - EST PT ICD-10 Diagnosis Description S80.11XD Contusion of right lower leg, subsequent encounter 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

## 2020-02-22 DIAGNOSIS — D631 Anemia in chronic kidney disease: Secondary | ICD-10-CM | POA: Diagnosis not present

## 2020-02-22 DIAGNOSIS — I5032 Chronic diastolic (congestive) heart failure: Secondary | ICD-10-CM | POA: Diagnosis not present

## 2020-02-22 DIAGNOSIS — I130 Hypertensive heart and chronic kidney disease with heart failure and stage 1 through stage 4 chronic kidney disease, or unspecified chronic kidney disease: Secondary | ICD-10-CM | POA: Diagnosis not present

## 2020-02-22 DIAGNOSIS — D500 Iron deficiency anemia secondary to blood loss (chronic): Secondary | ICD-10-CM | POA: Diagnosis not present

## 2020-02-22 DIAGNOSIS — N183 Chronic kidney disease, stage 3 unspecified: Secondary | ICD-10-CM | POA: Diagnosis not present

## 2020-02-22 DIAGNOSIS — I0981 Rheumatic heart failure: Secondary | ICD-10-CM | POA: Diagnosis not present

## 2020-02-23 NOTE — Progress Notes (Signed)
TULLY, BURGO T ([id number]) . Visit Report for 02/18/2020 Arrival Information Details Patient Name: Date of Service: [name], [place] NDA T. 02/18/2020 10:00 A M Medical Record Number: [id number] Patient Account Number: [id number] Date of Birth/Sex: Treating RN: Jul 21, 1945 (74 y.o. [name] Primary Care Brendy Ficek: Wilfred Lacy Other Clinician: Referring Janna Oak: Treating Ayden Hardwick/Extender: Heber [place] in Treatment: 1 Visit Information History Since Last Visit Added or deleted any medications: No Patient Arrived: [name] Any new allergies or adverse reactions: No Arrival Time: 10:49 Had a fall or experienced change in No Accompanied By: self activities of daily living that may affect Transfer Assistance: None risk of falls: Patient Identification Verified: Yes Signs or symptoms of abuse/neglect since last visito No Secondary Verification Process Completed: Yes Hospitalized since last visit: No Patient Has Alerts: Yes Implantable device outside of the clinic excluding No Patient Alerts: Patient on Blood Thinner cellular tissue based products placed in the center takes aspirin since last visit: Has Dressing in Place as Prescribed: Yes Pain Present Now: No Electronic Signature(s) Signed: 02/23/2020 10:17:44 AM By: Sandre Kitty Entered By: Sandre Kitty on 02/18/2020 10:49:32 -------------------------------------------------------------------------------- Compression Therapy Details Patient Name: Date of Service: [name], [name] NDA T. 02/18/2020 10:00 A M Medical Record Number: [id number] Patient Account Number: [id number] Date of Birth/Sex: Treating RN: Oct 27, 1945 (74 y.o. [name] Primary Care Rosell Khouri: Wilfred Lacy Other Clinician: Referring Jamicheal Heard: Treating Foye Haggart/Extender: Heber [place] in Treatment: 1 Compression Therapy Performed for Wound Assessment: Wound #2 Right,Medial Lower Leg Performed By:  Clinician Levan Hurst, RN Compression Type: Three Layer Post Procedure Diagnosis Same as Pre-procedure Electronic Signature(s) Signed: 02/18/2020 5:21:32 PM By: Levan Hurst RN, BSN Entered By: Levan Hurst on 02/18/2020 11:32:17 -------------------------------------------------------------------------------- Encounter Discharge Information Details Patient Name: Date of Service: [name], [name] NDA T. 02/18/2020 10:00 A M Medical Record Number: [id number] Patient Account Number: [id number] Date of Birth/Sex: Treating RN: 1946-03-02 (74 y.o. [name] Primary Care Darnel Mchan: Wilfred Lacy Other Clinician: Referring Colie Josten: Treating Joseandres Mazer/Extender: Heber [place] in Treatment: 1 Encounter Discharge Information Items Discharge Condition: Stable Ambulatory Status: Cane Discharge Destination: Home Transportation: Private Auto Accompanied By: self Schedule Follow-up Appointment: Yes Clinical Summary of Care: Patient Declined Electronic Signature(s) Signed: 02/18/2020 5:04:18 PM By: Baruch Gouty RN, BSN Entered By: Baruch Gouty on 02/18/2020 11:55:32 -------------------------------------------------------------------------------- Lower Extremity Assessment Details Patient Name: Date of Service: [name], [place] NDA T. 02/18/2020 10:00 A M Medical Record Number: [id number] Patient Account Number: [id number] Date of Birth/Sex: Treating RN: 31-May-1946 (74 y.o. Clearnce Sorrel Primary Care Cornel Werber: Wilfred Lacy Other Clinician: Referring Ellington Cornia: Treating Kincaid Tiger/Extender: Heber [place] in Treatment: 1 Edema Assessment Assessed: [Left: No] [Right: No] Edema: [Left: Ye] [Right: s] Calf Left: Right: Point of Measurement: 37 cm From Medial Instep cm 35.5 cm Ankle Left: Right: Point of Measurement: 17 cm From Medial Instep cm 26 cm Vascular Assessment Pulses: Dorsalis Pedis Palpable: [Right:Yes] Electronic  Signature(s) Signed: 02/18/2020 4:55:47 PM By: Kela Millin Entered By: Kela Millin on 02/18/2020 11:00:33 -------------------------------------------------------------------------------- [place] Details Patient Name: Date of Service: [name], [name] NDA T. 02/18/2020 10:00 A M Medical Record Number: [id number] Patient Account Number: [id number] Date of Birth/Sex: Treating RN: [birth year] (74 y.o. [name] Primary Care Ewel Lona: Wilfred Lacy Other Clinician: Referring Plez Belton: Treating Brittan Butterbaugh/Extender: Heber [place] in Treatment: 1 Active Inactive Abuse / Safety / Falls / Self Care Management Nursing Diagnoses: Potential for falls Potential  for injury related to falls Goals: Patient will remain injury free related to falls Date Initiated: 02/08/2020 Target Resolution Date: 03/11/2020 Goal Status: Active Patient/caregiver will verbalize/demonstrate measures taken to prevent injury and/or falls Date Initiated: 02/08/2020 Target Resolution Date: 03/11/2020 Goal Status: Active Interventions: Assess Activities of Daily Living upon admission and as needed Assess fall risk on admission and as needed Assess: immobility, friction, shearing, incontinence upon admission and as needed Assess impairment of mobility on admission and as needed per policy Assess personal safety and home safety (as indicated) on admission and as needed Assess self care needs on admission and as needed Provide education on fall prevention Provide education on personal and home safety Notes: Wound/Skin Impairment Nursing Diagnoses: Impaired tissue integrity Knowledge deficit related to ulceration/compromised skin integrity Goals: Patient/caregiver will verbalize understanding of skin care regimen Date Initiated: 02/08/2020 Target Resolution Date: 03/11/2020 Goal Status: Active Ulcer/skin breakdown will have a volume reduction of 30% by week 4 Date  Initiated: 02/08/2020 Target Resolution Date: 03/11/2020 Goal Status: Active Interventions: Assess patient/caregiver ability to obtain necessary supplies Assess patient/caregiver ability to perform ulcer/skin care regimen upon admission and as needed Assess ulceration(s) every visit Provide education on ulcer and skin care Notes: Electronic Signature(s) Signed: 02/18/2020 5:21:32 PM By: Levan Hurst RN, BSN Entered By: Levan Hurst on 02/18/2020 13:58:52 -------------------------------------------------------------------------------- Negative Pressure Wound Therapy Application (NPWT) Details Patient Name: Date of Service: [name], [place] NDA T. 02/18/2020 10:00 A M Medical Record Number: [id number] Patient Account Number: [id number] Date of Birth/Sex: Treating RN: January 24, 1946 (74 y.o. [name] Primary Care Ridhaan Dreibelbis: Other Clinician: Wilfred Lacy Referring Yahye Siebert: Treating Jordyn Doane/Extender: Heber [place] in Treatment: 1 NPWT Application Performed for: Wound #2 Right, Medial Lower Leg Performed By: Levan Hurst, RN Type: VAC System Coverage Size (sq cm): 1.56 Pressure Type: Constant Pressure Setting: 125 mmHG Drain Type: None Quantity of Sponges/Gauze Inserted: 1 piece blue foam Sponge/Dressing Type: Foam, Green Date Initiated: 02/18/2020 Response to Treatment: pt tolerated well Post Procedure Diagnosis Same as Pre-procedure Electronic Signature(s) Signed: 02/18/2020 5:21:32 PM By: Levan Hurst RN, BSN Entered By: Levan Hurst on 02/18/2020 11:31:06 -------------------------------------------------------------------------------- Pain Assessment Details Patient Name: Date of Service: [name], [name] NDA T. 02/18/2020 10:00 A M Medical Record Number: [id number] Patient Account Number: [id number] Date of Birth/Sex: Treating RN: Nov 12, 1945 (74 y.o. [name] Primary Care Kypton Eltringham: Wilfred Lacy Other Clinician: Referring  Leydy Worthey: Treating Bransen Fassnacht/Extender: Heber [place] in Treatment: 1 Active Problems Location of Pain Severity and Description of Pain Patient Has Paino No Site Locations Pain Management and Medication Current Pain Management: Electronic Signature(s) Signed: 02/18/2020 5:21:32 PM By: Levan Hurst RN, BSN Signed: 02/23/2020 10:17:44 AM By: Sandre Kitty Entered By: Sandre Kitty on 02/18/2020 10:49:51 -------------------------------------------------------------------------------- Patient/Caregiver Education Details Patient Name: Date of Service: [name] NDA T. 8/5/2021andnbsp10:00 A M Medical Record Number: [id number] Patient Account Number: [id number] Date of Birth/Gender: Treating RN: [birth year] (74 y.o. [name] Primary Care Physician: Wilfred Lacy Other Clinician: Referring Physician: Treating Physician/Extender: Heber [place] in Treatment: 1 Education Assessment Education Provided To: Patient Education Topics Provided Wound/Skin Impairment: Methods: Explain/Verbal Responses: State content correctly Electronic Signature(s) Signed: 02/18/2020 5:21:32 PM By: Levan Hurst RN, BSN Entered By: Levan Hurst on 02/18/2020 13:59:06 -------------------------------------------------------------------------------- Wound Assessment Details Patient Name: Date of Service: [name], [name] NDA T. 02/18/2020 10:00 A M Medical Record Number: [id number] Patient Account Number: [id number] Date of Birth/Sex: Treating RN: 06-28-1946 (74 y.o. [name] Primary  Care Madlynn Lundeen: Wilfred Lacy Other Clinician: Referring Claressa Hughley: Treating Elster Corbello/Extender: Heber [place] in Treatment: 1 Wound Status Wound Number: 2 Primary Trauma, Other Etiology: Wound Location: Right, Medial Lower Leg Wound Open Wounding Event: Trauma Status: Date Acquired: 12/15/2019 Comorbid Cataracts, Anemia, Asthma,  Arrhythmia, Congestive Heart Weeks Of Treatment: 1 History: Failure, Hypertension, End Stage Renal Disease, Gout Clustered Wound: No Photos Photo Uploaded By: Mikeal Hawthorne on 02/18/2020 13:30:21 Wound Measurements Length: (cm) 1.2 Width: (cm) 1.3 Depth: (cm) 1.2 Area: (cm) 1.225 Volume: (cm) 1.47 % Reduction in Area: -18.1% % Reduction in Volume: 16.6% Epithelialization: None Tunneling: No Undermining: Yes Starting Position (o'clock): 9 Ending Position (o'clock): 1 Maximum Distance: (cm) 1.5 Wound Description Classification: Full Thickness With Exposed Support Structures Wound Margin: Well defined, not attached Exudate Amount: Medium Exudate Type: Serosanguineous Exudate Color: red, brown Foul Odor After Cleansing: No Slough/Fibrino Yes Wound Bed Granulation Amount: Medium (34-66%) Exposed Structure Granulation Quality: Pink Fascia Exposed: No Necrotic Amount: Medium (34-66%) Fat Layer (Subcutaneous Tissue) Exposed: Yes Necrotic Quality: Adherent Slough Tendon Exposed: No Muscle Exposed: Yes Necrosis of Muscle: No Joint Exposed: No Bone Exposed: No Treatment Notes Wound #2 (Right, Medial Lower Leg) 2. Periwound Care Moisturizing lotion 3. Primary Dressing Applied Other primary dressing (specifiy in notes) 6. Support Layer Applied 3 layer compression wrap Notes SNAP VAC Electronic Signature(s) Signed: 02/18/2020 4:55:47 PM By: Kela Millin Signed: 02/18/2020 5:21:32 PM By: Levan Hurst RN, BSN Entered By: Kela Millin on 02/18/2020 11:02:28 -------------------------------------------------------------------------------- [place] Details Patient Name: Date of Service: [name], [name] NDA T. 02/18/2020 10:00 A M Medical Record Number: [id number] Patient Account Number: [id number] Date of Birth/Sex: Treating RN: 09-20-1945 (74 y.o. [name] Primary Care Mayo Faulk: Wilfred Lacy Other Clinician: Referring Cailan Antonucci: Treating Alta Goding/Extender:  Heber [place] in Treatment: 1 Vital Signs Time Taken: 10:49 Temperature (F): 97.6 Height (in): 65 Pulse (bpm): 96 Weight (lbs): 142 Respiratory Rate (breaths/min): 19 Body Mass Index (BMI): 23.6 Blood Pressure (mmHg): 148/79 Reference Range: 80 - 120 mg / dl Electronic Signature(s) Signed: 02/23/2020 10:17:44 AM By: Sandre Kitty Entered By: Sandre Kitty on 02/18/2020 10:49:46

## 2020-02-24 ENCOUNTER — Ambulatory Visit: Payer: Self-pay | Admitting: *Deleted

## 2020-02-25 ENCOUNTER — Encounter (HOSPITAL_BASED_OUTPATIENT_CLINIC_OR_DEPARTMENT_OTHER): Payer: Medicare Other | Admitting: Internal Medicine

## 2020-02-25 ENCOUNTER — Other Ambulatory Visit: Payer: Self-pay

## 2020-02-25 DIAGNOSIS — I739 Peripheral vascular disease, unspecified: Secondary | ICD-10-CM | POA: Diagnosis not present

## 2020-02-25 DIAGNOSIS — L409 Psoriasis, unspecified: Secondary | ICD-10-CM | POA: Diagnosis not present

## 2020-02-25 DIAGNOSIS — Z7952 Long term (current) use of systemic steroids: Secondary | ICD-10-CM | POA: Diagnosis not present

## 2020-02-25 DIAGNOSIS — U071 COVID-19: Secondary | ICD-10-CM | POA: Diagnosis not present

## 2020-02-25 DIAGNOSIS — N2581 Secondary hyperparathyroidism of renal origin: Secondary | ICD-10-CM | POA: Diagnosis not present

## 2020-02-25 DIAGNOSIS — Z9181 History of falling: Secondary | ICD-10-CM | POA: Diagnosis not present

## 2020-02-25 DIAGNOSIS — Z792 Long term (current) use of antibiotics: Secondary | ICD-10-CM | POA: Diagnosis not present

## 2020-02-25 DIAGNOSIS — Z86711 Personal history of pulmonary embolism: Secondary | ICD-10-CM | POA: Diagnosis not present

## 2020-02-25 DIAGNOSIS — Z8673 Personal history of transient ischemic attack (TIA), and cerebral infarction without residual deficits: Secondary | ICD-10-CM | POA: Diagnosis not present

## 2020-02-25 DIAGNOSIS — J45909 Unspecified asthma, uncomplicated: Secondary | ICD-10-CM | POA: Diagnosis not present

## 2020-02-25 DIAGNOSIS — L97813 Non-pressure chronic ulcer of other part of right lower leg with necrosis of muscle: Secondary | ICD-10-CM | POA: Diagnosis not present

## 2020-02-25 DIAGNOSIS — Z940 Kidney transplant status: Secondary | ICD-10-CM | POA: Diagnosis not present

## 2020-02-25 DIAGNOSIS — I5032 Chronic diastolic (congestive) heart failure: Secondary | ICD-10-CM | POA: Diagnosis not present

## 2020-02-25 DIAGNOSIS — I480 Paroxysmal atrial fibrillation: Secondary | ICD-10-CM | POA: Diagnosis not present

## 2020-02-25 DIAGNOSIS — L989 Disorder of the skin and subcutaneous tissue, unspecified: Secondary | ICD-10-CM | POA: Diagnosis not present

## 2020-02-25 DIAGNOSIS — J1282 Pneumonia due to coronavirus disease 2019: Secondary | ICD-10-CM | POA: Diagnosis not present

## 2020-02-25 DIAGNOSIS — F329 Major depressive disorder, single episode, unspecified: Secondary | ICD-10-CM | POA: Diagnosis not present

## 2020-02-25 DIAGNOSIS — M1030 Gout due to renal impairment, unspecified site: Secondary | ICD-10-CM | POA: Diagnosis not present

## 2020-02-25 DIAGNOSIS — I0981 Rheumatic heart failure: Secondary | ICD-10-CM | POA: Diagnosis not present

## 2020-02-25 DIAGNOSIS — F419 Anxiety disorder, unspecified: Secondary | ICD-10-CM | POA: Diagnosis not present

## 2020-02-25 DIAGNOSIS — Z79891 Long term (current) use of opiate analgesic: Secondary | ICD-10-CM | POA: Diagnosis not present

## 2020-02-25 DIAGNOSIS — I132 Hypertensive heart and chronic kidney disease with heart failure and with stage 5 chronic kidney disease, or end stage renal disease: Secondary | ICD-10-CM | POA: Diagnosis not present

## 2020-02-25 DIAGNOSIS — Z7951 Long term (current) use of inhaled steroids: Secondary | ICD-10-CM | POA: Diagnosis not present

## 2020-02-25 DIAGNOSIS — I130 Hypertensive heart and chronic kidney disease with heart failure and stage 1 through stage 4 chronic kidney disease, or unspecified chronic kidney disease: Secondary | ICD-10-CM | POA: Diagnosis not present

## 2020-02-25 DIAGNOSIS — E213 Hyperparathyroidism, unspecified: Secondary | ICD-10-CM | POA: Diagnosis not present

## 2020-02-25 DIAGNOSIS — I081 Rheumatic disorders of both mitral and tricuspid valves: Secondary | ICD-10-CM | POA: Diagnosis not present

## 2020-02-25 DIAGNOSIS — I420 Dilated cardiomyopathy: Secondary | ICD-10-CM | POA: Diagnosis not present

## 2020-02-25 DIAGNOSIS — N183 Chronic kidney disease, stage 3 unspecified: Secondary | ICD-10-CM | POA: Diagnosis not present

## 2020-02-25 DIAGNOSIS — J9621 Acute and chronic respiratory failure with hypoxia: Secondary | ICD-10-CM | POA: Diagnosis not present

## 2020-02-25 DIAGNOSIS — D631 Anemia in chronic kidney disease: Secondary | ICD-10-CM | POA: Diagnosis not present

## 2020-02-25 DIAGNOSIS — K5731 Diverticulosis of large intestine without perforation or abscess with bleeding: Secondary | ICD-10-CM | POA: Diagnosis not present

## 2020-02-25 DIAGNOSIS — I4819 Other persistent atrial fibrillation: Secondary | ICD-10-CM | POA: Diagnosis not present

## 2020-02-25 DIAGNOSIS — I509 Heart failure, unspecified: Secondary | ICD-10-CM | POA: Diagnosis not present

## 2020-02-25 DIAGNOSIS — D500 Iron deficiency anemia secondary to blood loss (chronic): Secondary | ICD-10-CM | POA: Diagnosis not present

## 2020-02-25 DIAGNOSIS — G4733 Obstructive sleep apnea (adult) (pediatric): Secondary | ICD-10-CM | POA: Diagnosis not present

## 2020-02-25 DIAGNOSIS — Z9981 Dependence on supplemental oxygen: Secondary | ICD-10-CM | POA: Diagnosis not present

## 2020-02-25 DIAGNOSIS — S8011XD Contusion of right lower leg, subsequent encounter: Secondary | ICD-10-CM | POA: Diagnosis not present

## 2020-02-26 ENCOUNTER — Other Ambulatory Visit: Payer: Self-pay

## 2020-02-26 NOTE — Progress Notes (Signed)
HIAWATHA, DRESSEL T ([id number]) . Visit Report for 02/25/2020 Arrival Information Details Patient Name: Date of Service: [name] NDA T. 02/25/2020 9:30 A M Medical Record Number: [id number] Patient Account Number: [id number] Date of Birth/Sex: Treating RN: 1945/10/11 (74 y.o. Clearnce Sorrel Primary Care Cordarrel Stiefel: Wilfred Lacy Other Clinician: Referring Latana Colin: Treating Karryn Kosinski/Extender: Kathlene [place] in Treatment: 2 Visit Information History Since Last Visit Added or deleted any medications: No Patient Arrived: [name] Any new allergies or adverse reactions: No Arrival Time: 09:42 Had a fall or experienced change in No Accompanied By: self activities of daily living that may affect Transfer Assistance: None risk of falls: Patient Identification Verified: Yes Signs or symptoms of abuse/neglect since last visito No Secondary Verification Process Completed: Yes Hospitalized since last visit: No Patient Has Alerts: Yes Implantable device outside of the clinic excluding No Patient Alerts: Patient on Blood Thinner cellular tissue based products placed in the center takes aspirin since last visit: Has Dressing in Place as Prescribed: Yes Has Compression in Place as Prescribed: Yes Pain Present Now: No Electronic Signature(s) Signed: 02/25/2020 5:05:13 PM By: Kela Millin Entered By: Kela Millin on 02/25/2020 09:43:13 -------------------------------------------------------------------------------- Compression Therapy Details Patient Name: Date of Service: [name], [name] NDA T. 02/25/2020 9:30 A M Medical Record Number: [id number] Patient Account Number: [id number] Date of Birth/Sex: Treating RN: 1945/07/24 (74 y.o. [name] Primary Care Alex Leahy: Wilfred Lacy Other Clinician: Referring Haeleigh Streiff: Treating Farrin Shadle/Extender: Kathlene [place] in Treatment: 2 Compression Therapy Performed for Wound Assessment:  Wound #2 Right,Medial Lower Leg Performed By: Clinician Levan Hurst, RN Compression Type: Three Layer Post Procedure Diagnosis Same as Pre-procedure Electronic Signature(s) Signed: 02/26/2020 5:19:54 PM By: Levan Hurst RN, BSN Entered By: Levan Hurst on 02/25/2020 10:06:49 -------------------------------------------------------------------------------- Encounter Discharge Information Details Patient Name: Date of Service: [name], [place] NDA T. 02/25/2020 9:30 A M Medical Record Number: [id number] Patient Account Number: [id number] Date of Birth/Sex: Treating RN: [birth year] (74 y.o. [name] Primary Care Kadeidra Coryell: Wilfred Lacy Other Clinician: Referring Yuritzy Zehring: Treating Justino Boze/Extender: Kathlene [place] in Treatment: 2 Encounter Discharge Information Items Discharge Condition: Stable Ambulatory Status: Cane Discharge Destination: Home Transportation: Private Auto Accompanied By: self Schedule Follow-up Appointment: Yes Clinical Summary of Care: Patient Declined Electronic Signature(s) Signed: 02/26/2020 5:26:58 PM By: Baruch Gouty RN, BSN Entered By: Baruch Gouty on 02/25/2020 10:24:23 -------------------------------------------------------------------------------- Lower Extremity Assessment Details Patient Name: Date of Service: [name], [place] NDA T. 02/25/2020 9:30 A M Medical Record Number: [id number] Patient Account Number: [id number] Date of Birth/Sex: Treating RN: 05/21/1946 (74 y.o. Clearnce Sorrel Primary Care Deeric Cruise: Wilfred Lacy Other Clinician: Referring Jerlene Rockers: Treating Terran Hollenkamp/Extender: Kathlene [place] in Treatment: 2 Edema Assessment Assessed: [Left: No] [Right: No] Edema: [Left: Ye] [Right: s] Calf Left: Right: Point of Measurement: 37 cm From Medial Instep cm 30 cm Ankle Left: Right: Point of Measurement: 17 cm From Medial Instep cm 21 cm Vascular  Assessment Pulses: Dorsalis Pedis Palpable: [Right:Yes] Electronic Signature(s) Signed: 02/25/2020 5:05:13 PM By: Kela Millin Entered By: Kela Millin on 02/25/2020 09:45:05 -------------------------------------------------------------------------------- Multi-Disciplinary Care Plan Details Patient Name: Date of Service: [name], [place] NDA T. 02/25/2020 9:30 A M Medical Record Number: [id number] Patient Account Number: [id number] Date of Birth/Sex: Treating RN: [birth year] (74 y.o. [name] Primary Care Amiera Herzberg: Wilfred Lacy Other Clinician: Referring Danee Soller: Treating Nishaan Stanke/Extender: Kathlene [place] in Treatment: 2 Active Inactive Abuse / Safety / Falls / Self Care Management Nursing Diagnoses: Potential for  falls Potential for injury related to falls Goals: Patient will remain injury free related to falls Date Initiated: 02/08/2020 Target Resolution Date: 03/11/2020 Goal Status: Active Patient/caregiver will verbalize/demonstrate measures taken to prevent injury and/or falls Date Initiated: 02/08/2020 Target Resolution Date: 03/11/2020 Goal Status: Active Interventions: Assess Activities of Daily Living upon admission and as needed Assess fall risk on admission and as needed Assess: immobility, friction, shearing, incontinence upon admission and as needed Assess impairment of mobility on admission and as needed per policy Assess personal safety and home safety (as indicated) on admission and as needed Assess self care needs on admission and as needed Provide education on fall prevention Provide education on personal and home safety Notes: Wound/Skin Impairment Nursing Diagnoses: Impaired tissue integrity Knowledge deficit related to ulceration/compromised skin integrity Goals: Patient/caregiver will verbalize understanding of skin care regimen Date Initiated: 02/08/2020 Target Resolution Date: 03/11/2020 Goal Status:  Active Ulcer/skin breakdown will have a volume reduction of 30% by week 4 Date Initiated: 02/08/2020 Target Resolution Date: 03/11/2020 Goal Status: Active Interventions: Assess patient/caregiver ability to obtain necessary supplies Assess patient/caregiver ability to perform ulcer/skin care regimen upon admission and as needed Assess ulceration(s) every visit Provide education on ulcer and skin care Notes: Electronic Signature(s) Signed: 02/26/2020 5:19:54 PM By: Levan Hurst RN, BSN Entered By: Levan Hurst on 02/25/2020 10:05:43 -------------------------------------------------------------------------------- Negative Pressure Wound Therapy Maintenance (NPWT) Details Patient Name: Date of Service: [name] NDA T. 02/25/2020 9:30 A M Medical Record Number: [id number] Patient Account Number: [id number] Date of Birth/Sex: Treating RN: Dec 07, 1945 (74 y.o. [name] Primary Care Skii Cleland: Wilfred Lacy Other Clinician: Referring Rasean Joos: Treating Ranessa Kosta/Extender: Kathlene [place] in Treatment: 2 NPWT Maintenance Performed for: Wound #2 Right, Medial Lower Leg Performed By: Levan Hurst, RN Type: VAC System Coverage Size (sq cm): 1.56 Pressure Type: Constant Pressure Setting: 125 mmHG Drain Type: None Sponge/Dressing Type: Foam, Green Date Initiated: 02/18/2020 Dressing Removed: Yes Quantity of Sponges/Gauze Removed: 1 piece blue foam Canister Changed: Yes Canister Exudate Volume: 30 Dressing Reapplied: Yes Quantity of Sponges/Gauze Inserted: 1 piece blue foam Respones T Treatment: o pt tolerated well Days On NPWT : 8 Post Procedure Diagnosis Same as Pre-procedure Electronic Signature(s) Signed: 02/26/2020 5:19:54 PM By: Levan Hurst RN, BSN Entered By: Levan Hurst on 02/25/2020 10:06:38 -------------------------------------------------------------------------------- Pain Assessment Details Patient Name: Date of Service: [name], [name] NDA T. 02/25/2020 9:30 A M Medical Record Number: [id number] Patient Account Number: [id number] Date of Birth/Sex: Treating RN: 1945/10/17 (74 y.o. Clearnce Sorrel Primary Care Lexxie Winberg: Wilfred Lacy Other Clinician: Referring Felise [place]: Treating Dawid Dupriest/Extender: Kathlene [place] in Treatment: 2 Active Problems Location of Pain Severity and Description of Pain Patient Has Paino No Site Locations Pain Management and Medication Current Pain Management: Electronic Signature(s) Signed: 02/25/2020 5:05:13 PM By: Kela Millin Entered By: Kela Millin on 02/25/2020 09:43:40 -------------------------------------------------------------------------------- Patient/Caregiver Education Details Patient Name: Date of Service: [name] NDA T. 8/12/2021andnbsp9:30 A M Medical Record Number: [id number] Patient Account Number: [id number] Date of Birth/Gender: Treating RN: 26-Apr-1946 (74 y.o. [name] Primary Care Physician: Wilfred Lacy Other Clinician: Referring Physician: Treating Physician/Extender: Kathlene [place] in Treatment: 2 Education Assessment Education Provided To: Patient Education Topics Provided Wound/Skin Impairment: Methods: Explain/Verbal Responses: State content correctly Electronic Signature(s) Signed: 02/26/2020 5:19:54 PM By: Levan Hurst RN, BSN Entered By: Levan Hurst on 02/25/2020 10:05:54 -------------------------------------------------------------------------------- Wound Assessment Details Patient Name: Date of Service: [name], [name] NDA T. 02/25/2020 9:30 A M Medical Record Number:  [id number] Patient Account Number: [id number] Date of Birth/Sex: Treating RN: [birth year] (74 y.o. Clearnce Sorrel Primary Care Maziah Keeling: Wilfred Lacy Other Clinician: Referring Katie Faraone: Treating Tila Millirons/Extender: Kathlene [place] in Treatment: 2 Wound  Status Wound Number: 2 Primary Trauma, Other Etiology: Wound Location: Right, Medial Lower Leg Wound Open Wounding Event: Trauma Status: Date Acquired: 12/15/2019 Comorbid Cataracts, Anemia, Asthma, Arrhythmia, Congestive Heart Weeks Of Treatment: 2 History: Failure, Hypertension, End Stage Renal Disease, Gout Clustered Wound: No Wound Measurements Length: (cm) 1.2 Width: (cm) 1.3 Depth: (cm) 1.1 Area: (cm) 1.225 Volume: (cm) 1.348 % Reduction in Area: -18.1% % Reduction in Volume: 23.5% Epithelialization: None Undermining: Yes Starting Position (o'clock): 9 Ending Position (o'clock): 12 Maximum Distance: (cm) 0.9 Wound Description Classification: Full Thickness With Exposed Support Structures Wound Margin: Well defined, not attached Exudate Amount: Medium Exudate Type: Serosanguineous Exudate Color: red, brown Foul Odor After Cleansing: No Slough/Fibrino Yes Wound Bed Granulation Amount: Large (67-100%) Exposed Structure Granulation Quality: Pink Fascia Exposed: No Necrotic Amount: Small (1-33%) Fat Layer (Subcutaneous Tissue) Exposed: Yes Necrotic Quality: Adherent Slough Tendon Exposed: No Muscle Exposed: Yes Necrosis of Muscle: No Joint Exposed: No Bone Exposed: No Treatment Notes Wound #2 (Right, Medial Lower Leg) 2. Periwound Care Moisturizing lotion Skin Prep 3. Primary Dressing Applied Other primary dressing (specifiy in notes) 6. Support Layer Applied 3 layer compression wrap Notes SNAP VAC Electronic Signature(s) Signed: 02/25/2020 5:05:13 PM By: Kela Millin Entered By: Kela Millin on 02/25/2020 09:47:02 -------------------------------------------------------------------------------- Vitals Details Patient Name: Date of Service: [name], [name] NDA T. 02/25/2020 9:30 A M Medical Record Number: [id number] Patient Account Number: [id number] Date of Birth/Sex: Treating RN: 1946-02-10 (74 y.o. Clearnce Sorrel Primary Care Lassie Demorest:  Wilfred Lacy Other Clinician: Referring Sahra Converse: Treating Mckensie Scotti/Extender: Kathlene November in Treatment: 2 Vital Signs Time Taken: 09:43 Temperature (F): 97.5 Height (in): 65 Pulse (bpm): 86 Weight (lbs): 142 Respiratory Rate (breaths/min): 19 Body Mass Index (BMI): 23.6 Blood Pressure (mmHg): 155/86 Reference Range: 80 - 120 mg / dl Electronic Signature(s) Signed: 02/25/2020 5:05:13 PM By: Kela Millin Entered By: Kela Millin on 02/25/2020 09:43:34

## 2020-02-26 NOTE — Patient Outreach (Signed)
[facility]) Care Management  02/26/2020  [name] [birth year] [id number]   Telephone assessment: New referral from Assistant director to contact patient for needs assessment.  Place call to patient who answered.  Reviewed the reason for call. Patient reports to me that she has a wound vac due to an object falling from the freezer and hitting her leg..  Reports she has an abscess.  Reports she is attending the wound center weekly.    Denies an home health involvement.  Reports she is able to drive herself to appointments. Reports she is taking her medications as prescribed.  Reports she is unstable with ambulation and uses a cane and her hands to hold onto things.    PLAN: patient request additional telephone follow up in 1 month. Will mail a successful outreach letter to patient. Will Actuary.

## 2020-02-26 NOTE — Progress Notes (Signed)
LENOIR, FACCHINI T ([id number]) . Visit Report for 02/25/2020 HPI Details Patient Name: Date of Service: [name] NDA T. 02/25/2020 9:30 A M Medical Record Number: [id number] Patient Account Number: [id number] Date of Birth/Sex: Treating RN: Feb 28, 1946 (74 y.o. [name] Primary Care Provider: Wilfred Lacy Other Clinician: Referring Provider: Treating Provider/Extender: Kathlene November in Treatment: 2 History of Present Illness HPI Description: 02/08/2020; this is a 74 year old woman who hit her leg when frozen checks and fell out of the freezer. By her description she had a classic swelling without opening and compatible with a hematoma. She developed tissue skin necrosis over the hematoma had drainage out of it. Nevertheless I do not think she was aggressive in getting medical attention. In any case she eventually saw her primary doctor. She had 10 weight 10 days of Keflex which did not help. She again saw her primary physician on 7/15. She was referred here. She came in without anything on the wound. Past medical history includes asthma, depression, paroxysmal atrial fib status post AV nodal ablation and pacemaker, renal transplant, intracranial hemorrhage, cardiomyopathy congestive heart failure IVC filter.Marland Kitchen ABI in our clinic was 1.0 02/18/2020 upon evaluation today patient appears to be doing a little better in regard to her wound. This is a bit clearer obviously than last time. With that being said we did get approval for the snap VAC I think that is a good option for [name] to initiate today. 02/25/20-Patient back at 1 week, continuing with the snap VAC and 3 layer compression, which seems to be working although the dimensions of the same the surface of the wound looks really good Electronic Signature(s) Signed: 02/25/2020 10:06:21 AM By: Tobi Bastos MD, MBA Entered By: Tobi Bastos on 02/25/2020  10:06:20 -------------------------------------------------------------------------------- Physical Exam Details Patient Name: Date of Service: [name] NDA T. 02/25/2020 9:30 A M Medical Record Number: [id number] Patient Account Number: [id number] Date of Birth/Sex: Treating RN: 1945-09-28 (74 y.o. [name] Primary Care Provider: Wilfred Lacy Other Clinician: Referring Provider: Treating Provider/Extender: Kathlene November in Treatment: 2 Constitutional alert and oriented x 3. sitting or standing blood pressure is within target range for patient.. supine blood pressure is within target range for patient.. pulse regular and within target range for patient.Marland Kitchen respirations regular, non-labored and within target range for patient.Marland Kitchen temperature within target range for patient.. . . Well- nourished and well-hydrated in no acute distress. Notes Left anterior leg wound circumscribed, with healthy granulation tissue and depth Electronic Signature(s) Signed: 02/25/2020 10:07:37 AM By: Tobi Bastos MD, MBA Entered By: Tobi Bastos on 02/25/2020 10:07:36 -------------------------------------------------------------------------------- Physician Orders Details Patient Name: Date of Service: [name], [name] NDA T. 02/25/2020 9:30 A M Medical Record Number: [id number] Patient Account Number: [id number] Date of Birth/Sex: Treating RN: 1945-11-17 (74 y.o. 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 Dressing Change Frequency Wound #2 Right,Medial Lower  Leg Do not change entire dressing for one week. Skin Barriers/Peri-Wound Care Skin Prep Moisturizing lotion Wound Cleansing May shower with protection. - use cast protector Primary Wound Dressing Wound #2 Right,Medial Lower Leg Other: - SNAP vac Negative Presssure Wound Therapy Wound #2 Right,Medial Lower Leg  SNAP Vac to wound continuously at 130mm/hg pressure Edema Control 3 Layer Compression System - Right Lower Extremity Avoid standing for long periods of time Elevate legs to the level of the heart or above for 30 minutes daily and/or when sitting, a frequency of: - throughout the day Exercise regularly Electronic Signature(s) Signed: 02/25/2020 5:03:09 PM By: Tobi Bastos MD, MBA Signed: 02/26/2020 5:19:54 PM By: Levan Hurst RN, BSN Entered By: Levan Hurst on 02/25/2020 10:05:35 -------------------------------------------------------------------------------- Problem List Details Patient Name: Date of Service: [name], [name] NDA T. 02/25/2020 9:30 A M Medical Record Number: [id number] Patient Account Number: [id number] Date of Birth/Sex: Treating RN: 04/25/1946 (74 y.o. 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 02/25/2020 9:30 A M Medical Record Number: [id number] Patient Account Number: [id number] Date of Birth/Sex: Treating RN: 07-Dec-1945 (74 y.o. [name] Primary Care Provider: Wilfred Lacy Other Clinician: Referring Provider: Treating Provider/Extender: Kathlene November in Treatment: 2 Subjective History of Present Illness (HPI) 02/08/2020; this is a 74 year old woman who hit her leg when frozen checks and fell out of the freezer. By her description she had a classic swelling without opening and compatible with a hematoma. She developed tissue skin necrosis over the hematoma had drainage out of it. Nevertheless I do not think she was aggressive in getting medical attention. In any case she eventually saw her primary doctor. She had 10 weight 10 days of Keflex which did not help. She again saw her primary physician on 7/15. She was referred here. She came in without anything on the wound. Past medical history includes asthma, depression, paroxysmal atrial fib status post AV nodal ablation and pacemaker, renal transplant, intracranial hemorrhage, cardiomyopathy congestive heart failure IVC filter.Marland Kitchen ABI in our clinic was 1.0 02/18/2020 upon evaluation today patient appears to be doing a little better in regard to her wound. This is a bit clearer obviously than last time. With that being said we did get approval for the snap VAC I think that is a good option for [name] to initiate today. 02/25/20-Patient back at 1 week, continuing with the snap VAC and 3 layer compression, which seems to be working although the dimensions of the same the surface of the wound looks really good Objective Constitutional alert and oriented x 3. sitting or standing blood pressure is within target range for patient.. supine blood pressure is within target range for patient.. pulse regular and within target range for patient.Marland Kitchen respirations regular, non-labored and within target range for  patient.Marland Kitchen temperature within target range for patient.. Well- nourished and well-hydrated in no acute distress. Vitals Time Taken: 9:43 AM, Height: 65 in, Weight: 142 lbs, BMI: 23.6, Temperature: 97.5 F, Pulse: 86 bpm, Respiratory Rate: 19 breaths/min, Blood Pressure: 155/86 mmHg. General Notes: Left anterior leg wound circumscribed, with healthy granulation tissue and depth Integumentary (Hair, Skin) Wound #2 status is Open. Original cause of wound was Trauma. The wound is located on  the Right,Medial Lower Leg. The wound measures 1.2cm length x 1.3cm width x 1.1cm depth; 1.225cm^2 area and 1.348cm^3 volume. There is muscle and Fat Layer (Subcutaneous Tissue) Exposed exposed. There is undermining starting at 9:00 and ending at 12:00 with a maximum distance of 0.9cm. There is a medium amount of serosanguineous drainage noted. The wound margin is well defined and not attached to the wound base. There is large (67-100%) pink granulation within the wound bed. There is a small (1-33%) amount of necrotic tissue within the wound bed including Adherent Slough. 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 There was a Three Layer Compression Therapy Procedure by Levan Hurst, RN. Post procedure Diagnosis Wound #2: Same as Pre-Procedure Plan Follow-up Appointments: Return Appointment in 1 week. Dressing Change Frequency: Wound #2 Right,Medial Lower Leg: Do not change entire dressing for one week. Skin Barriers/Peri-Wound Care: Skin Prep Moisturizing lotion Wound Cleansing: May shower with protection. - use cast protector Primary Wound Dressing: Wound #2 Right,Medial Lower Leg: Other: - SNAP vac Negative Presssure Wound Therapy: Wound #2 Right,Medial  Lower Leg: SNAP Vac to wound continuously at 197mm/hg pressure Edema Control: 3 Layer Compression System - Right Lower Extremity Avoid standing for long periods of time Elevate legs to the level of the heart or above for 30 minutes daily and/or when sitting, a frequency of: - throughout the day Exercise regularly -Continue snap VAC with 3 layer compression -Patient return to clinic next week for dressing change, On the whole with seem to be doing well with the strategy Electronic Signature(s) Signed: 02/25/2020 10:08:33 AM By: Tobi Bastos MD, MBA Previous Signature: 02/25/2020 10:08:12 AM Version By: Tobi Bastos MD, MBA Entered By: Tobi Bastos on 02/25/2020 10:08:32 -------------------------------------------------------------------------------- SuperBill Details Patient Name: Date of Service: [name], [name] NDA T. 02/25/2020 Medical Record Number: [id number] Patient Account Number: [id number] Date of Birth/Sex: Treating RN: 05/22/1946 (74 y.o. 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 Modifier: Quantity: 1 CPT4 Code: [id number] ( Description: Facility Use Only) [id number] - [place] COMPRS LWR RT LEG Modifier: 59 Quantity: 1 Physician Procedures : CPT4 Code Description Modifier [id number] - WC PHYS LEVEL 3 - EST PT ICD-10 Diagnosis Description L97.813 Non-pressure chronic ulcer of other part of right lower leg with necrosis of  muscle Quantity: 1 : [id number] NEG PRESS WND TX <=50 SQ CM ICD-10 Diagnosis Description 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

## 2020-02-29 ENCOUNTER — Encounter (HOSPITAL_BASED_OUTPATIENT_CLINIC_OR_DEPARTMENT_OTHER): Payer: Medicare Other | Admitting: Internal Medicine

## 2020-03-01 ENCOUNTER — Other Ambulatory Visit (HOSPITAL_COMMUNITY): Payer: Self-pay | Admitting: *Deleted

## 2020-03-01 DIAGNOSIS — D500 Iron deficiency anemia secondary to blood loss (chronic): Secondary | ICD-10-CM | POA: Diagnosis not present

## 2020-03-01 DIAGNOSIS — I4819 Other persistent atrial fibrillation: Secondary | ICD-10-CM | POA: Diagnosis not present

## 2020-03-01 DIAGNOSIS — I5032 Chronic diastolic (congestive) heart failure: Secondary | ICD-10-CM | POA: Diagnosis not present

## 2020-03-01 DIAGNOSIS — N183 Chronic kidney disease, stage 3 unspecified: Secondary | ICD-10-CM | POA: Diagnosis not present

## 2020-03-01 DIAGNOSIS — D631 Anemia in chronic kidney disease: Secondary | ICD-10-CM | POA: Diagnosis not present

## 2020-03-01 DIAGNOSIS — I130 Hypertensive heart and chronic kidney disease with heart failure and stage 1 through stage 4 chronic kidney disease, or unspecified chronic kidney disease: Secondary | ICD-10-CM | POA: Diagnosis not present

## 2020-03-01 DIAGNOSIS — J1282 Pneumonia due to coronavirus disease 2019: Secondary | ICD-10-CM | POA: Diagnosis not present

## 2020-03-01 DIAGNOSIS — U071 COVID-19: Secondary | ICD-10-CM | POA: Diagnosis not present

## 2020-03-01 DIAGNOSIS — J9621 Acute and chronic respiratory failure with hypoxia: Secondary | ICD-10-CM | POA: Diagnosis not present

## 2020-03-01 NOTE — Discharge Instructions (Signed)

## 2020-03-02 ENCOUNTER — Inpatient Hospital Stay (HOSPITAL_COMMUNITY)
Admission: RE | Admit: 2020-03-02 | Discharge: 2020-03-02 | Disposition: A | Discharge status: Home / Self Care | Payer: Medicare Other | Source: Ambulatory Visit | Attending: Nephrology | Admitting: Nephrology

## 2020-03-02 ENCOUNTER — Encounter (HOSPITAL_COMMUNITY): Payer: Self-pay

## 2020-03-02 ENCOUNTER — Telehealth: Payer: Self-pay

## 2020-03-02 DIAGNOSIS — D631 Anemia in chronic kidney disease: Secondary | ICD-10-CM | POA: Diagnosis not present

## 2020-03-02 DIAGNOSIS — I5032 Chronic diastolic (congestive) heart failure: Secondary | ICD-10-CM | POA: Diagnosis not present

## 2020-03-02 DIAGNOSIS — J1282 Pneumonia due to coronavirus disease 2019: Secondary | ICD-10-CM | POA: Diagnosis not present

## 2020-03-02 DIAGNOSIS — J9621 Acute and chronic respiratory failure with hypoxia: Secondary | ICD-10-CM | POA: Diagnosis not present

## 2020-03-02 DIAGNOSIS — D500 Iron deficiency anemia secondary to blood loss (chronic): Secondary | ICD-10-CM | POA: Diagnosis not present

## 2020-03-02 DIAGNOSIS — I4819 Other persistent atrial fibrillation: Secondary | ICD-10-CM | POA: Diagnosis not present

## 2020-03-02 DIAGNOSIS — I130 Hypertensive heart and chronic kidney disease with heart failure and stage 1 through stage 4 chronic kidney disease, or unspecified chronic kidney disease: Secondary | ICD-10-CM | POA: Diagnosis not present

## 2020-03-02 DIAGNOSIS — U071 COVID-19: Secondary | ICD-10-CM | POA: Diagnosis not present

## 2020-03-02 DIAGNOSIS — N183 Chronic kidney disease, stage 3 unspecified: Secondary | ICD-10-CM | POA: Diagnosis not present

## 2020-03-02 NOTE — Progress Notes (Signed)
I have attempted without success to contact this patient by phone three times to do her COPD Disease State call. I left a Voice message for patient to return my call.   [name],CPA Clinical Pharmacist Assistant [phone]   

## 2020-03-04 ENCOUNTER — Encounter (HOSPITAL_BASED_OUTPATIENT_CLINIC_OR_DEPARTMENT_OTHER): Payer: Medicare Other | Admitting: Internal Medicine

## 2020-03-04 DIAGNOSIS — I480 Paroxysmal atrial fibrillation: Secondary | ICD-10-CM | POA: Diagnosis not present

## 2020-03-04 DIAGNOSIS — L97813 Non-pressure chronic ulcer of other part of right lower leg with necrosis of muscle: Secondary | ICD-10-CM | POA: Diagnosis not present

## 2020-03-04 DIAGNOSIS — J45909 Unspecified asthma, uncomplicated: Secondary | ICD-10-CM | POA: Diagnosis not present

## 2020-03-04 DIAGNOSIS — I509 Heart failure, unspecified: Secondary | ICD-10-CM | POA: Diagnosis not present

## 2020-03-04 DIAGNOSIS — I132 Hypertensive heart and chronic kidney disease with heart failure and with stage 5 chronic kidney disease, or end stage renal disease: Secondary | ICD-10-CM | POA: Diagnosis not present

## 2020-03-04 DIAGNOSIS — S8011XD Contusion of right lower leg, subsequent encounter: Secondary | ICD-10-CM | POA: Diagnosis not present

## 2020-03-07 ENCOUNTER — Ambulatory Visit: Payer: Self-pay | Admitting: *Deleted

## 2020-03-07 ENCOUNTER — Telehealth: Payer: Self-pay | Admitting: Nurse Practitioner

## 2020-03-07 DIAGNOSIS — U071 COVID-19: Secondary | ICD-10-CM | POA: Diagnosis not present

## 2020-03-07 DIAGNOSIS — N183 Chronic kidney disease, stage 3 unspecified: Secondary | ICD-10-CM | POA: Diagnosis not present

## 2020-03-07 DIAGNOSIS — J1282 Pneumonia due to coronavirus disease 2019: Secondary | ICD-10-CM | POA: Diagnosis not present

## 2020-03-07 DIAGNOSIS — I5032 Chronic diastolic (congestive) heart failure: Secondary | ICD-10-CM | POA: Diagnosis not present

## 2020-03-07 DIAGNOSIS — I130 Hypertensive heart and chronic kidney disease with heart failure and stage 1 through stage 4 chronic kidney disease, or unspecified chronic kidney disease: Secondary | ICD-10-CM | POA: Diagnosis not present

## 2020-03-07 DIAGNOSIS — D631 Anemia in chronic kidney disease: Secondary | ICD-10-CM | POA: Diagnosis not present

## 2020-03-07 DIAGNOSIS — D500 Iron deficiency anemia secondary to blood loss (chronic): Secondary | ICD-10-CM | POA: Diagnosis not present

## 2020-03-07 DIAGNOSIS — I4819 Other persistent atrial fibrillation: Secondary | ICD-10-CM | POA: Diagnosis not present

## 2020-03-07 DIAGNOSIS — J9621 Acute and chronic respiratory failure with hypoxia: Secondary | ICD-10-CM | POA: Diagnosis not present

## 2020-03-07 NOTE — Telephone Encounter (Signed)
Patient refuses to go to the ED stating she is feeling a little better anyway. I informed patient if she starts to feel bad again or if symptoms worse she should go to the ED. We scheduled her a appointment with Charlotte [name]-NP for 03/10/20 to follow up. Patient agreed and verbalized understanding.

## 2020-03-07 NOTE — Progress Notes (Signed)
KAYLEN, MOTL T ([id number]) . Visit Report for 03/04/2020 HPI Details Patient Name: Date of Service: [name] NDA T. 03/04/2020 9:15 A M Medical Record Number: [id number] Patient Account Number: [id number] Date of Birth/Sex: Treating RN: [birth year] (74 y.o. Clearnce Sorrel Primary Care Provider: Wilfred Lacy Other Clinician: Referring Provider: Treating Provider/Extender: Alvira Monday in Treatment: 3 History of Present Illness HPI Description: 02/08/2020; this is a 74 year old woman who hit her leg when frozen chicken that fell out of the freezer. By her description she had a classic swelling without opening and compatible with a hematoma. She developed tissue skin necrosis over the hematoma had drainage out of it. Nevertheless I do not think she was aggressive in getting medical attention. In any case she eventually saw her primary doctor. She had 10 weight 10 days of Keflex which did not help. She again saw her primary physician on 7/15. She was referred here. She came in without anything on the wound. Past medical history includes asthma, depression, paroxysmal atrial fib status post AV nodal ablation and pacemaker, renal transplant, intracranial hemorrhage, cardiomyopathy congestive heart failure IVC filter.Marland Kitchen ABI in our clinic was 1.0 02/18/2020 upon evaluation today patient appears to be doing a little better in regard to her wound. This is a bit clearer obviously than last time. With that being said we did get approval for the snap VAC I think that is a good option for [name] to initiate today. 02/25/20-Patient back at 1 week, continuing with the snap VAC and 3 layer compression, which seems to be working although the dimensions of the same the surface of the wound looks really good 8/20; remarkable improvement since I last saw that she has been using the snap VAC for about 2 weeks. There is now no depth of this wound and I do not think the VAC is going to be  needed any further. Electronic Signature(s) Signed: 03/07/2020 7:15:26 AM By: Linton Ham MD Entered By: Linton Ham on 03/04/2020 11:24:49 -------------------------------------------------------------------------------- Physical Exam Details Patient Name: Date of Service: [name], [name] NDA T. 03/04/2020 9:15 A M Medical Record Number: [id number] Patient Account Number: [id number] Date of Birth/Sex: Treating RN: [birth year] (74 y.o. Clearnce Sorrel Primary Care Provider: Wilfred Lacy Other Clinician: Referring Provider: Treating Provider/Extender: Alvira Monday in Treatment: 3 Constitutional Sitting or standing Blood Pressure is within target range for patient.. Pulse regular and within target range for patient.Marland Kitchen Respirations regular, non-labored and within target range.. Temperature is normal and within the target range for the patient.Marland Kitchen Appears in no distress. Cardiovascular Pedal pulses are palpable. Changes of chronic venous insufficiency. Notes Wound exam; left anterior lower leg in the setting of chronic venous insufficiency. The patient's remaining wound has no depth. There is an undermining area superiorly roughly 0.2 cm. No evidence of surrounding infection we have excellent edema control. Electronic Signature(s) Signed: 03/07/2020 7:15:26 AM By: Linton Ham MD Entered By: Linton Ham on 03/04/2020 11:23:53 -------------------------------------------------------------------------------- Physician Orders Details Patient Name: Date of Service: [name], [name] NDA T. 03/04/2020 9:15 A M Medical Record Number: [id number] Patient Account Number: [id number] Date of Birth/Sex: Treating RN: January 14, 1946 (74 y.o. 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 Dressing Change Frequency Wound #2 Right,Medial Lower Leg Do not change entire dressing for one week. Skin Barriers/Peri-Wound Care Skin Prep Moisturizing lotion Wound Cleansing May shower with protection. - use cast protector Primary Wound Dressing Wound #2 Right,Medial Lower Leg Silver Collagen - moisten with hydrogel Secondary Dressing Dry Gauze Negative Presssure Wound Therapy Wound #2 Right,Medial Lower Leg SNAP Vac to wound continuously at 13mm/hg pressure - Discontinue Edema Control 3 Layer Compression System - Right Lower Extremity Avoid standing for long periods of time Elevate legs to the level of the heart or above for 30 minutes daily and/or when sitting, a frequency of: - throughout the day Exercise regularly Electronic Signature(s) Signed: 03/04/2020 4:56:28 PM By: Kela Millin Signed: 03/07/2020 7:15:26 AM By: Linton Ham MD Entered By: Kela Millin on 03/04/2020 11:04:28 -------------------------------------------------------------------------------- Problem List Details Patient Name: Date of Service: [name], [name] NDA T. 03/04/2020 9:15 A M Medical Record Number: [id number] Patient Account Number: [id number] Date of Birth/Sex: Treating RN: 08-08-1945 (74 y.o. 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 03/04/2020 9:15 A M Medical Record Number: [id number] Patient Account Number: [id number] Date of Birth/Sex: Treating RN: Feb 29, 1946 (74 y.o. Clearnce Sorrel Primary Care Provider: Wilfred Lacy Other Clinician: Referring Provider: Treating Provider/Extender: Alvira Monday in Treatment: 3 Subjective History of Present Illness (HPI) 02/08/2020; this is a 74 year old woman who hit her leg when frozen chicken that fell out of the freezer. By her description she had a classic swelling without opening and compatible with a hematoma. She developed tissue skin necrosis over the hematoma had drainage out of it. Nevertheless I do not think she was aggressive in getting medical attention. In any case she eventually saw her primary doctor. She had 10 weight 10 days of Keflex which did not help. She again saw her primary physician on 7/15. She was referred here. She came in without anything on the wound. Past medical history includes asthma, depression, paroxysmal atrial fib status post AV nodal ablation and pacemaker, renal transplant, intracranial hemorrhage, cardiomyopathy congestive heart failure IVC filter.Marland Kitchen ABI in our clinic was 1.0 02/18/2020 upon evaluation today patient appears to be doing a little better in regard to her wound. This is a bit clearer obviously than last time. With that being said we did get approval for the snap VAC I think that is a good option for [name] to initiate today. 02/25/20-Patient back at 1 week, continuing with the snap VAC and 3 layer compression, which seems to be working although the dimensions of the same the surface of the wound looks  really good 8/20; remarkable improvement since I last saw that she has been using the snap VAC for about 2 weeks. There is now no depth of this wound and I do not think the VAC is going to be needed any further. Objective Constitutional Sitting or standing Blood Pressure is within target range for patient.. Pulse regular and within target range for patient.Marland Kitchen  Respirations regular, non-labored and within target range.. Temperature is normal and within the target range for the patient.Marland Kitchen Appears in no distress. Vitals Time Taken: 10:04 AM, Height: 65 in, Weight: 142 lbs, BMI: 23.6, Temperature: 98.4 F, Pulse: 87 bpm, Respiratory Rate: 18 breaths/min, Blood Pressure: 120/66 mmHg. Cardiovascular Pedal pulses are palpable. Changes of chronic venous insufficiency. General Notes: Wound exam; left anterior lower leg in the setting of chronic venous insufficiency. The patient's remaining wound has no depth. There is an undermining area superiorly roughly 0.2 cm. No evidence of surrounding infection we have excellent edema control. Integumentary (Hair, Skin) Wound #2 status is Open. Original cause of wound was Trauma. The wound is located on the Right,Medial Lower Leg. The wound measures 1.3cm length x 1.1cm width x 0.2cm depth; 1.123cm^2 area and 0.225cm^3 volume. There is muscle and Fat Layer (Subcutaneous Tissue) Exposed exposed. There is no tunneling or undermining noted. There is a medium amount of serosanguineous drainage noted. The wound margin is well defined and not attached to the wound base. There is large (67-100%) pink granulation within the wound bed. There is a small (1-33%) amount of necrotic tissue within the wound bed including Adherent Slough. 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 There was a Three Layer Compression Therapy Procedure by Baruch Gouty, RN. Post procedure Diagnosis Wound #2: Same as Pre-Procedure Plan Follow-up Appointments: Return Appointment in 1 week. Dressing Change Frequency: Wound #2 Right,Medial Lower Leg: Do not change entire dressing for one week. Skin Barriers/Peri-Wound Care: Skin Prep Moisturizing lotion Wound Cleansing: May shower with protection. - use cast protector Primary Wound Dressing: Wound #2 Right,Medial Lower Leg: Silver Collagen - moisten with hydrogel Secondary Dressing: Dry Gauze Negative Presssure Wound Therapy: Wound #2 Right,Medial Lower Leg: SNAP Vac to wound continuously at 129mm/hg pressure - Discontinue Edema Control: 3 Layer Compression System - Right Lower Extremity Avoid standing for long periods of time Elevate legs to the level of the heart or above for 30 minutes daily and/or when sitting, a frequency of: - throughout the day Exercise regularly 1 the patient's wound looks excellent 2. Certainly no need for the snap VAC which I have discontinued 3. Moistened silver collagen under 3 layer compression. This may be closed in 2 weeks Electronic Signature(s) Signed: 03/07/2020 7:15:26 AM By: Linton Ham MD Entered By: Linton Ham on 03/04/2020 11:26:35 -------------------------------------------------------------------------------- SuperBill Details Patient Name: Date of Service: [name], [name] NDA T. 03/04/2020 Medical Record Number: [id number] Patient Account Number: [id number] Date of Birth/Sex: Treating RN: Mar 02, 1946 (74 y.o. Clearnce Sorrel Primary Care Provider: Wilfred Lacy Other Clinician: Referring Provider: Treating Provider/Extender: Alvira Monday in Treatment: 3 Diagnosis Coding ICD-10 Codes Code Description S80.11XD Contusion of right lower leg, subsequent encounter L97.813 Non-pressure chronic ulcer  of other part of right lower leg with necrosis of muscle Facility Procedures CPT4 Code: [id number] Description: (Facility Use Only) [phone] - [facility] RT LEG Modifier: Quantity: 1 Physician Procedures : CPT4 Code Description Modifier [id number] - WC PHYS LEVEL 3 - EST PT ICD-10 Diagnosis Description S80.11XD Contusion of right lower leg, subsequent encounter 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

## 2020-03-07 NOTE — Telephone Encounter (Signed)
Patients husband called and stated the physical therapist recommended you call patient's PCP to say that patient is weak, oxygen level is low and she's not eating well. If patient does not answer phone, try her husband, [name], [phone].

## 2020-03-07 NOTE — Telephone Encounter (Signed)
Please reach out to Dr. Daiva Huge (cardiology) and ask if she can be seen soon. If now, please advise Ms. Beadles and her husband to go to ED due to SOB, low oxygen level and weakness.

## 2020-03-07 NOTE — Telephone Encounter (Signed)
Pt husband states in the past 3 days the patient has not ate much of anything and only has drunk a boost daily that he is certain of, pt is weak and was unable to get out of her chair without assistance.   Patient got on the phone and confirm the information but states she actually has not been eating x1.5 weeks, states she will feel like she wants to eat but then doesn't want any of the food so she just drinks a pepsi to get over the hunger. States she has been having some breathing issues similar to "asthma acting up"  Please advise

## 2020-03-08 ENCOUNTER — Other Ambulatory Visit: Payer: Self-pay

## 2020-03-08 ENCOUNTER — Ambulatory Visit
Admission: RE | Admit: 2020-03-08 | Discharge: 2020-03-08 | Disposition: A | Discharge status: Home / Self Care | Payer: Medicare Other | Source: Ambulatory Visit | Attending: Nurse Practitioner | Admitting: Nurse Practitioner

## 2020-03-08 DIAGNOSIS — Z1231 Encounter for screening mammogram for malignant neoplasm of breast: Secondary | ICD-10-CM

## 2020-03-08 IMAGING — MG DIGITAL SCREENING BILAT W/ TOMO W/ CAD
6 of 12 series · 6 of 36 positions shown · non-contrast
Comparison: Previous exam(s).

CLINICAL DATA: Screening.

EXAM:
DIGITAL SCREENING BILATERAL MAMMOGRAM WITH TOMO AND CAD

[L CC synth-2D (1 of 2)]
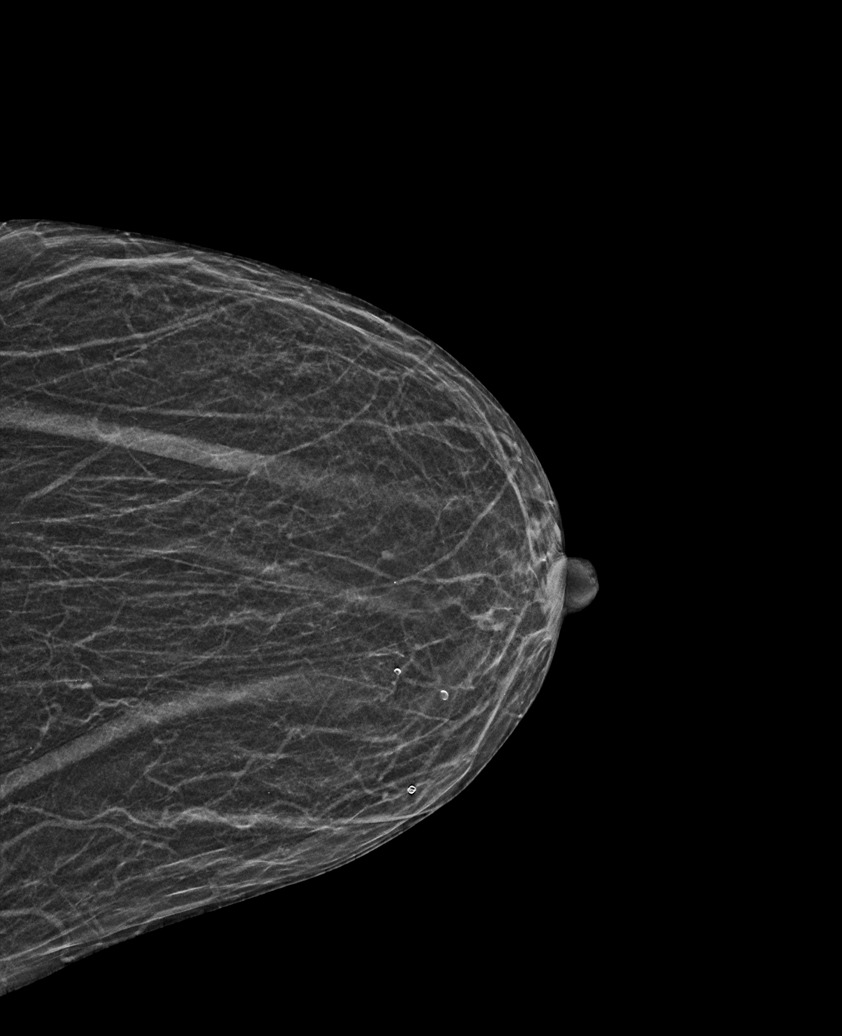

[L MLO synth-2D]
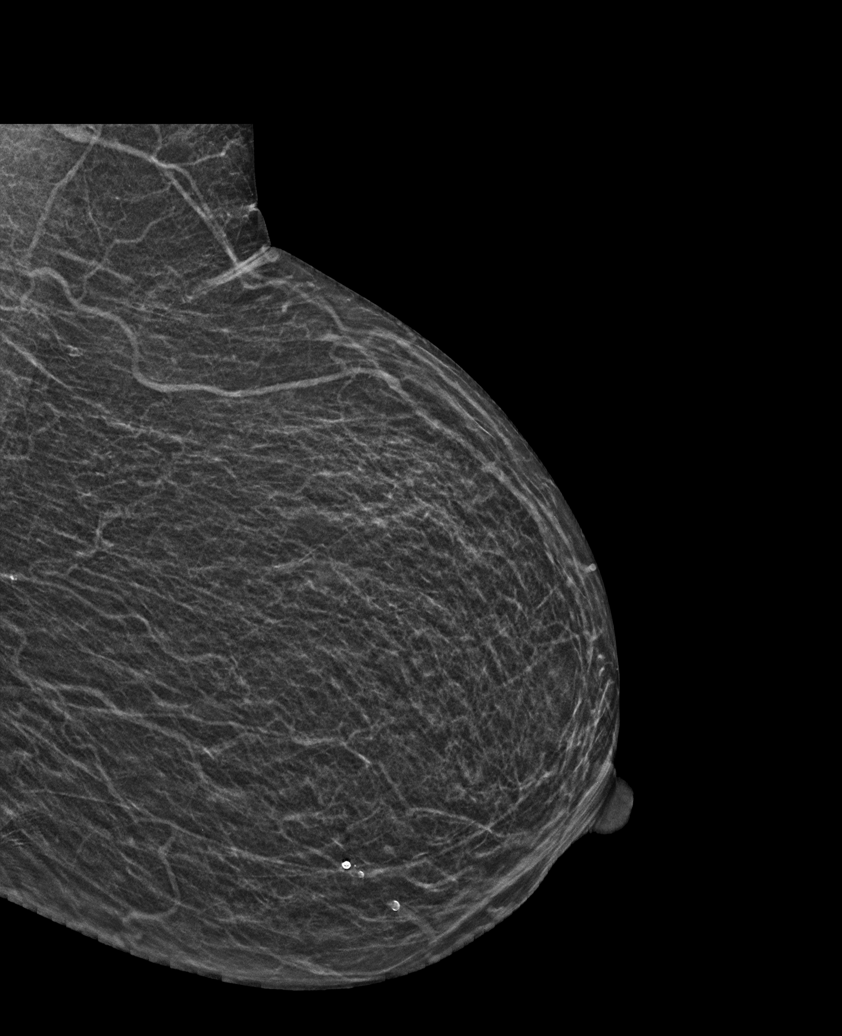

[R CC synth-2D (1 of 2)]
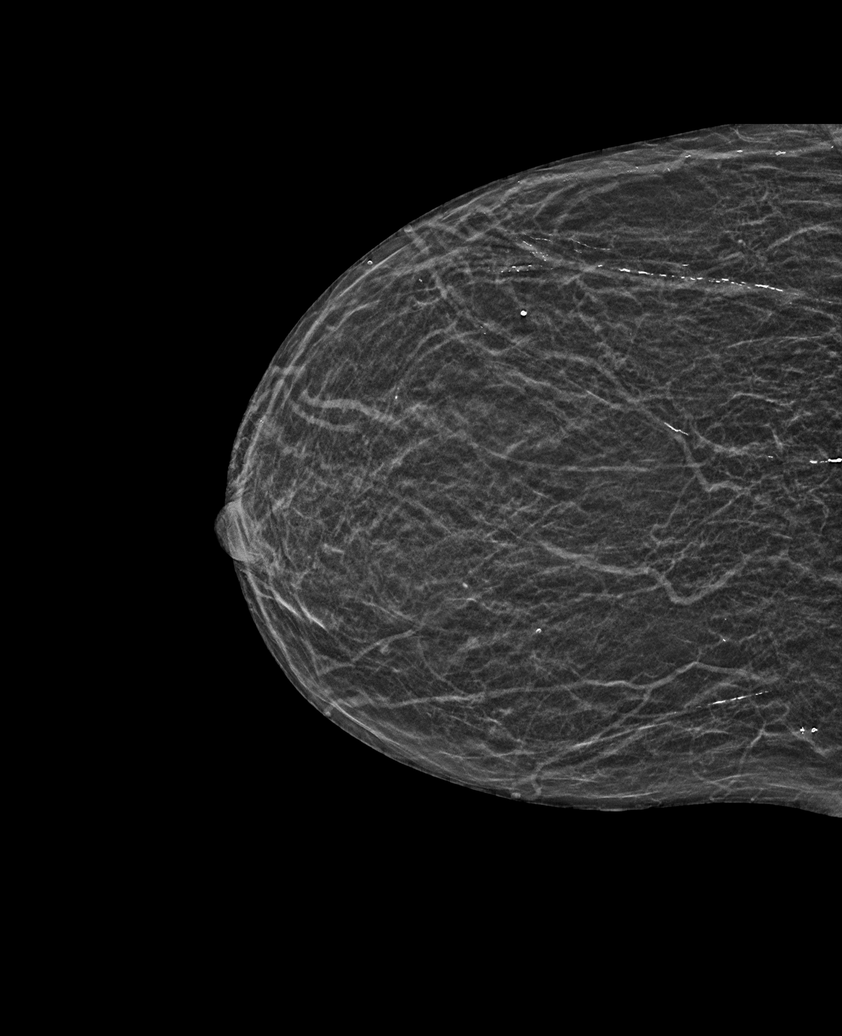

[R MLO synth-2D]
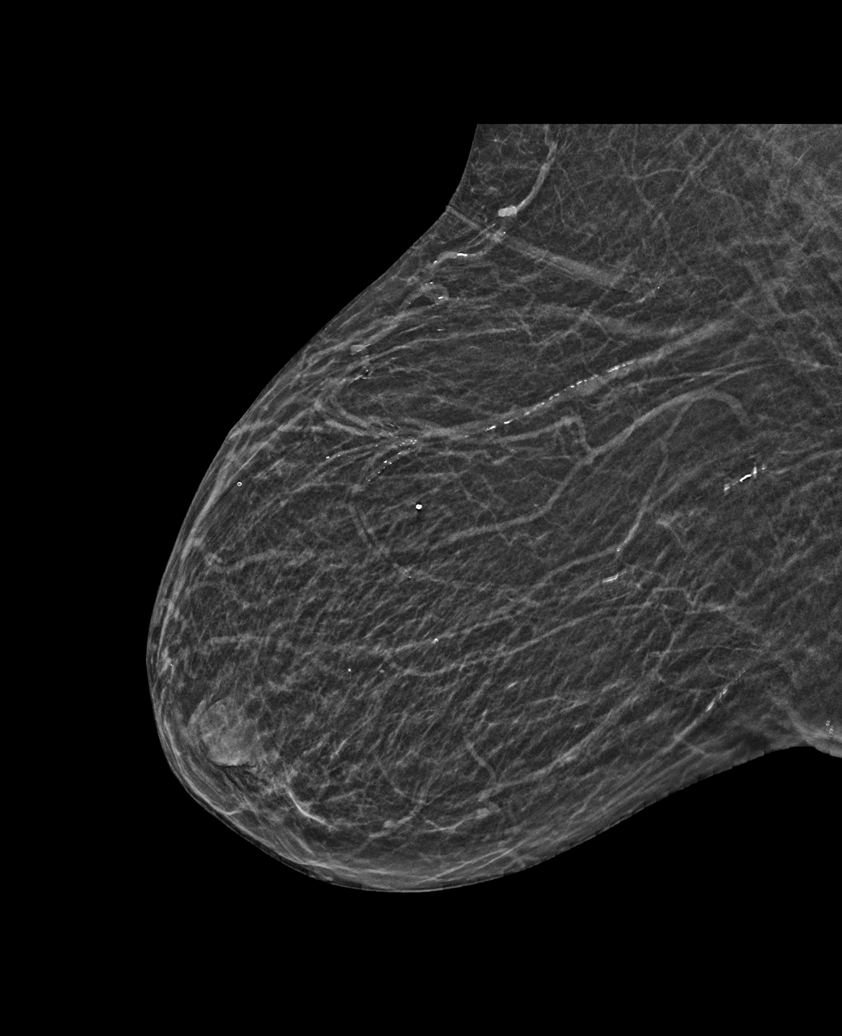

[R CC synth-2D (2 of 2)]
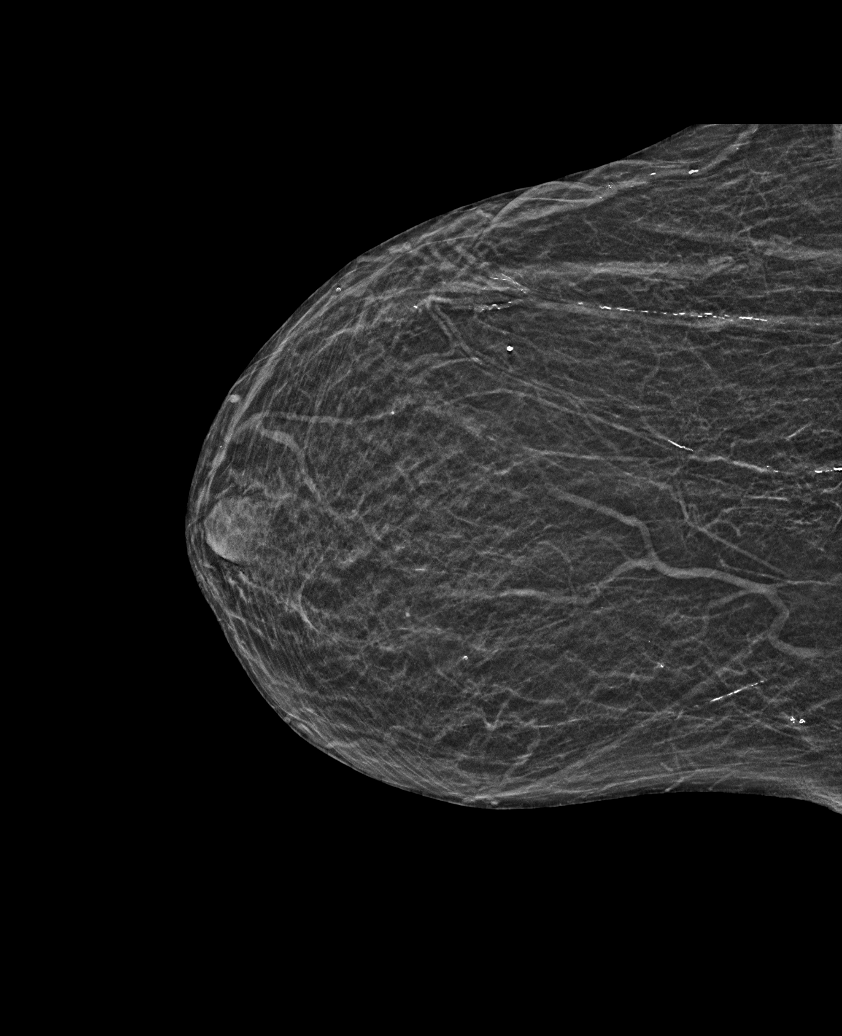

[L CC synth-2D (2 of 2)]
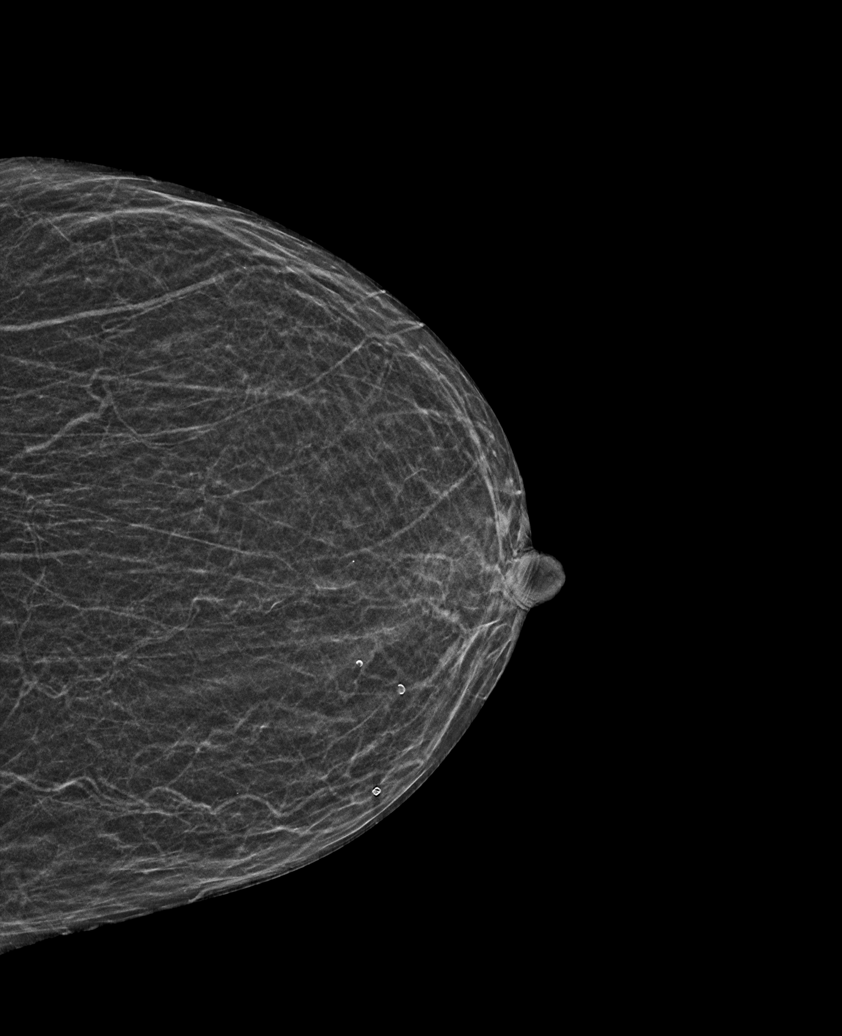

[6 of 36 positions shown; findings below may reference images not displayed]

ACR Breast Density Category b: There are scattered areas of
fibroglandular density.
FINDINGS: There are no findings suspicious for malignancy. Images were
processed with CAD.
IMPRESSION: No mammographic evidence of malignancy. A result letter of this
screening mammogram will be mailed directly to the patient.

RECOMMENDATION:
Screening mammogram in one year. (Code:[TQ])

BI-RADS CATEGORY  1: Negative.

## 2020-03-10 ENCOUNTER — Ambulatory Visit (INDEPENDENT_AMBULATORY_CARE_PROVIDER_SITE_OTHER): Payer: Medicare Other

## 2020-03-10 ENCOUNTER — Ambulatory Visit (INDEPENDENT_AMBULATORY_CARE_PROVIDER_SITE_OTHER): Payer: Medicare Other | Admitting: Nurse Practitioner

## 2020-03-10 ENCOUNTER — Other Ambulatory Visit: Payer: Self-pay

## 2020-03-10 ENCOUNTER — Encounter: Payer: Self-pay | Admitting: Nurse Practitioner

## 2020-03-10 ENCOUNTER — Encounter (HOSPITAL_BASED_OUTPATIENT_CLINIC_OR_DEPARTMENT_OTHER): Payer: Medicare Other | Admitting: Internal Medicine

## 2020-03-10 VITALS — BP 118/70 | HR 70 | Temp 96.9°F | Wt 142.8 lb

## 2020-03-10 DIAGNOSIS — I509 Heart failure, unspecified: Secondary | ICD-10-CM

## 2020-03-10 DIAGNOSIS — R0602 Shortness of breath: Secondary | ICD-10-CM | POA: Diagnosis not present

## 2020-03-10 DIAGNOSIS — J189 Pneumonia, unspecified organism: Secondary | ICD-10-CM

## 2020-03-10 DIAGNOSIS — I517 Cardiomegaly: Secondary | ICD-10-CM | POA: Diagnosis not present

## 2020-03-10 IMAGING — DX DG CHEST 2V
2 series · 2 of 2 positions shown · non-contrast
Comparison: [DATE].

CLINICAL DATA: Shortness of breath.

EXAM:
CHEST - 2 VIEW

[chest pa]
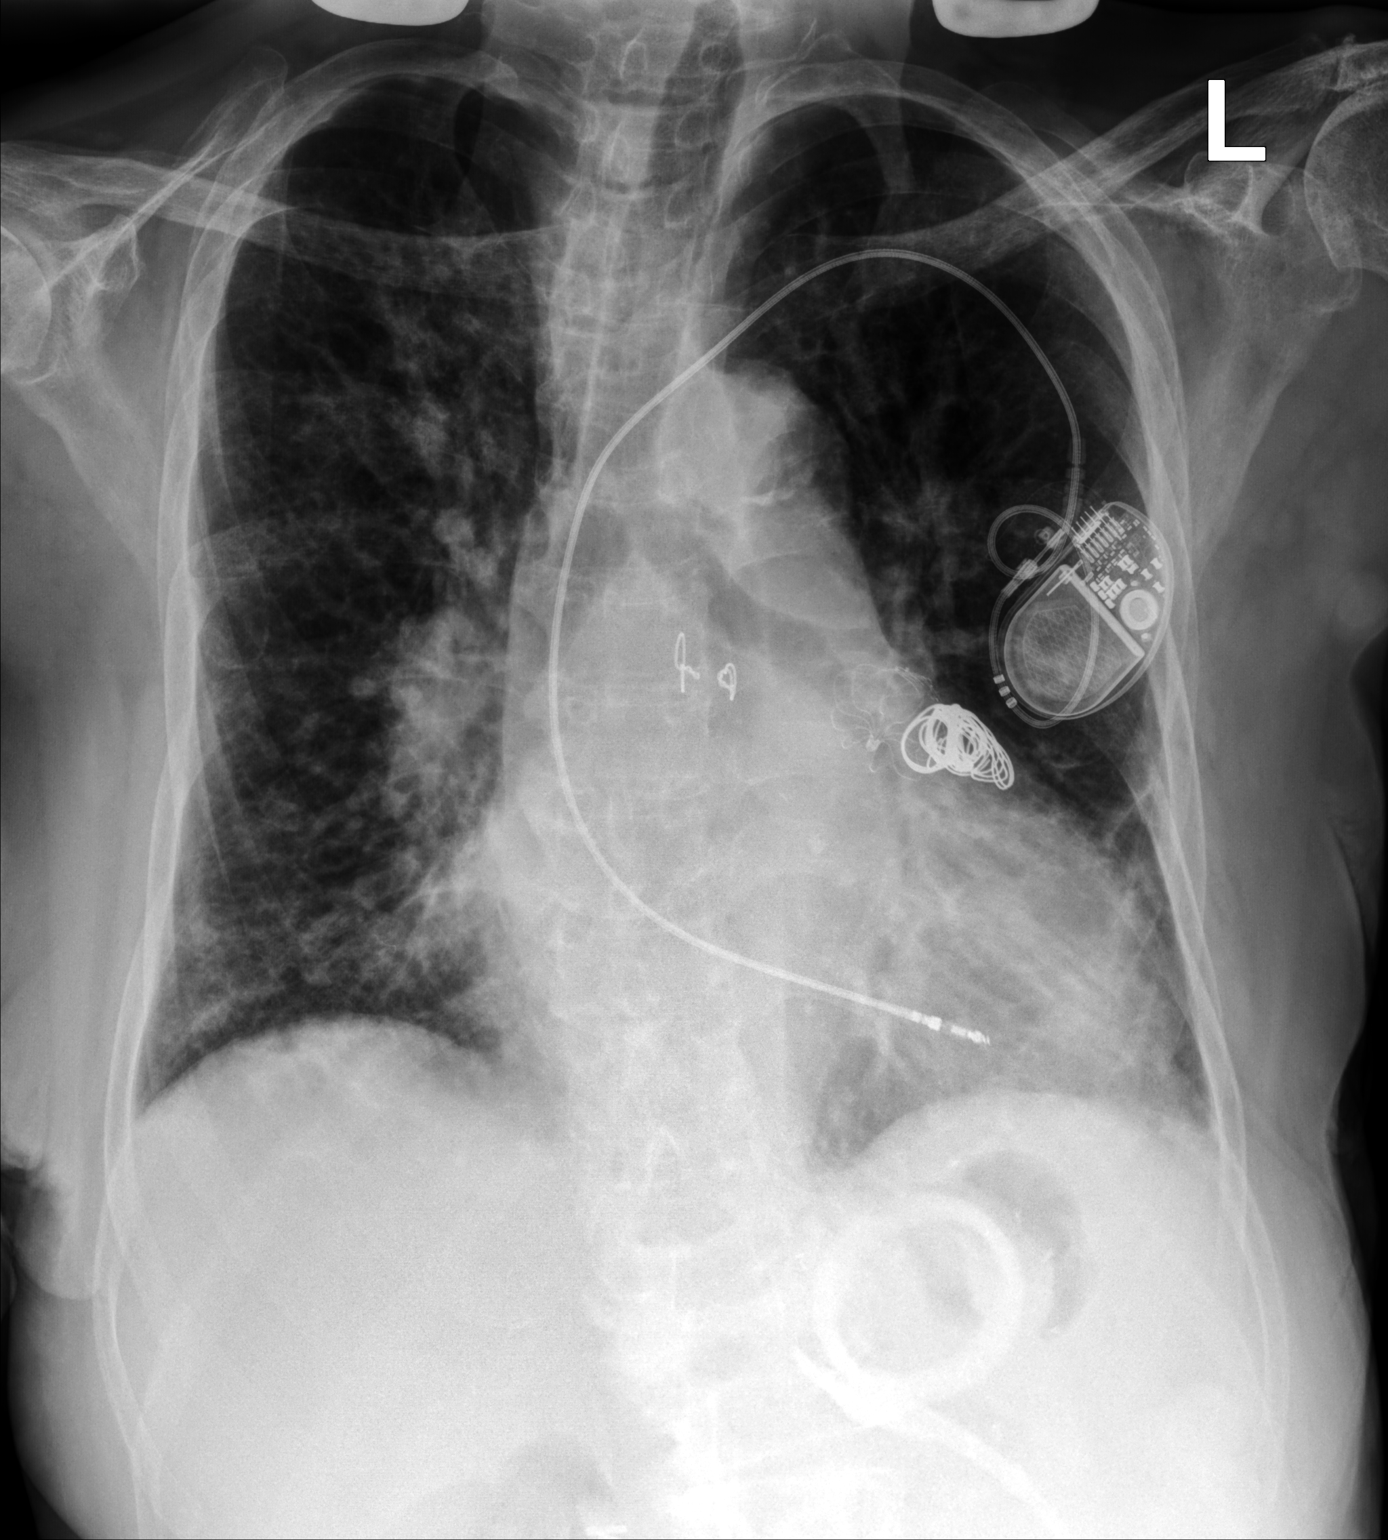

[chest lat]
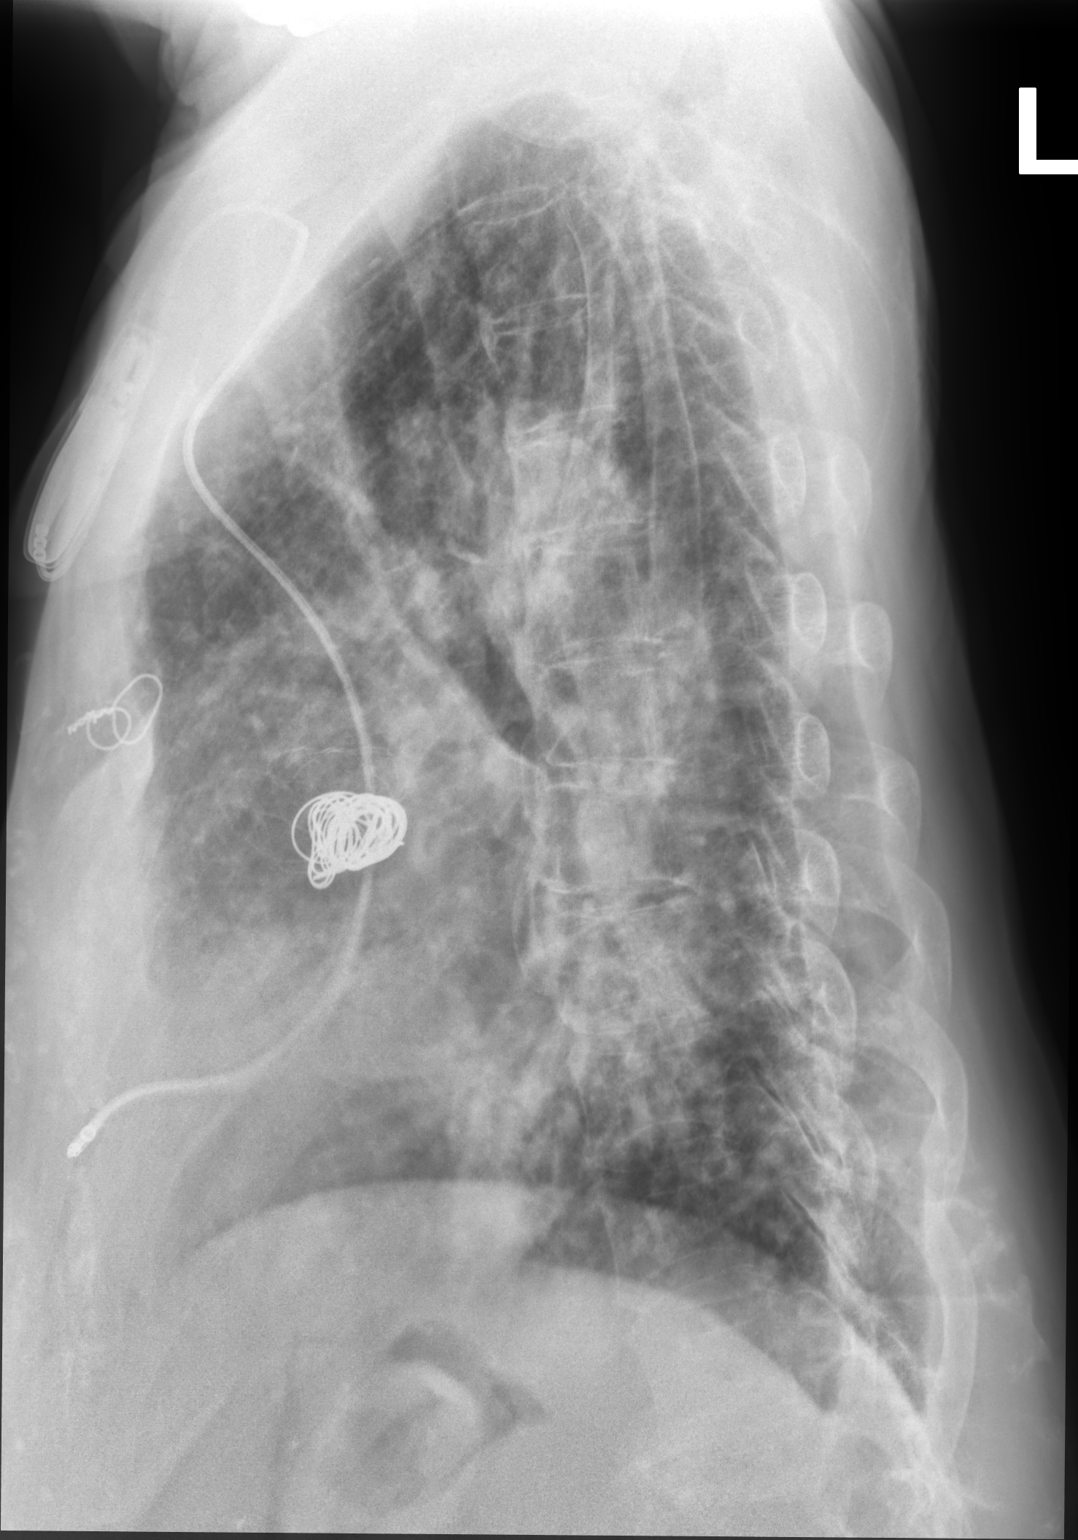

[2 of 2 positions shown; findings below may reference images not displayed]

FINDINGS: Cardiac pacer with lead tip over the right ventricle. Postsurgical
changes noted of chest with occlusion device and coils noted over
the left chest. Chronic interstitial changes again noted. Active
right base infiltrate cannot be excluded. No pleural effusion or
pneumothorax. Lap band noted stable position.
IMPRESSION: 1. Cardiac pacer in stable position. Stable postsurgical changes
noted the chest. Stable cardiomegaly.

2. Chronic interstitial lung disease. Superimposed acute infiltrate
right lung base cannot be excluded.

## 2020-03-10 MED ORDER — DOXYCYCLINE HYCLATE 100 MG PO TABS: 100.0000 mg | ORAL_TABLET | Freq: Two times a day (BID) | ORAL | 0 refills | Status: DC

## 2020-03-10 NOTE — Patient Instructions (Addendum)
Go to lab for CXR and blood draw I will call once  I get return call from CHF clinic.

## 2020-03-10 NOTE — Assessment & Plan Note (Signed)
Worsening SOB and chest congestion x 2weeks. Reports compliance with diet and medications. No weight gain noted today Diffuse rales upon auscultation She is noted in respiratory distress, no use of accessory muscles. O2 Sat on RA 94% She has ongoing home PT sessions, last session on Tuesday, she was able to complete session but took more breaks than usual. Wt Readings from Last 3 Encounters:  03/10/20 142 lb 12.8 oz (64.8 kg)  02/04/20 145 lb (65.8 kg)  01/28/20 144 lb (65.3 kg)   BP Readings from Last 3 Encounters:  03/10/20 118/70  02/04/20 (!) 140/82  01/28/20 126/70   Unable to increase current furosemide dose (80mg  BID) due to CKD and kidney transplant. Left voicemessage with CHF clinic for consultation request. Check cbc, bmp and BNP Get CXR

## 2020-03-10 NOTE — Progress Notes (Signed)
Subjective:  Patient ID: [name], female    DOB: [birth year]  Age: 74 y.o. MRN: [id number]  CC: Acute Visit (Weakness and SOB x2 weeks. )   HPI Accompanied by husband. Acute CHF (congestive heart failure) (HCC) Worsening SOB and chest congestion x 2weeks. Reports compliance with diet and medications. No weight gain noted today Diffuse rales upon auscultation She is noted in respiratory distress, no use of accessory muscles. O2 Sat on RA 94% She has ongoing home PT sessions, last session on Tuesday, she was able to complete session but took more breaks than usual. Wt Readings from Last 3 Encounters:  03/10/20 142 lb 12.8 oz (64.8 kg)  02/04/20 145 lb (65.8 kg)  01/28/20 144 lb (65.3 kg)   BP Readings from Last 3 Encounters:  03/10/20 118/70  02/04/20 (!) 140/82  01/28/20 126/70   Unable to increase current furosemide dose (80mg  BID) due to CKD and kidney transplant. Left voicemessage with CHF clinic for consultation request. Check cbc, bmp and BNP Get CXR  Wt Readings from Last 3 Encounters:  03/10/20 142 lb 12.8 oz (64.8 kg)  02/04/20 145 lb (65.8 kg)  01/28/20 144 lb (65.3 kg)   Reviewed past Medical, Social and Family history today.  Outpatient Medications Prior to Visit  Medication Sig Dispense Refill   acetaminophen (TYLENOL) 500 MG tablet Take 500 mg by mouth every 6 (six) hours as needed for mild pain or headache.      albuterol (PROVENTIL) (2.5 MG/3ML) 0.083% nebulizer solution Take 3 mLs (2.5 mg total) by nebulization every 6 (six) hours as needed for wheezing or shortness of breath. Dx:J45.909 150 mL 2   calcitRIOL (ROCALTROL) 0.25 MCG capsule Take 0.25 mcg by mouth every other day.     carvedilol (COREG) 12.5 MG tablet Take 2 tablets (25 mg total) by mouth 2 (two) times daily.     docusate sodium (COLACE) 100 MG capsule Take 100 mg by mouth daily as needed (For constipation.).      DULoxetine (CYMBALTA) 30 MG capsule TAKE 1 CAPSULE BY MOUTH EVERY DAY  90 capsule 2   Ferrous Sulfate (IRON PO) Take 325 mg by mouth daily.      furosemide (LASIX) 80 MG tablet Take 1 tablet (80 mg total) by mouth 2 (two) times daily. 60 tablet 3   magnesium oxide (MAG-OX) 400 MG tablet Take 2 tablets by mouth 2 (two) times daily.     mycophenolate (MYFORTIC) 180 MG EC tablet Take 180 mg by mouth 2 (two) times daily.     ondansetron (ZOFRAN) 4 MG tablet Take 1 tablet (4 mg total) by mouth every 6 (six) hours as needed for nausea. 20 tablet 0   potassium chloride SA (K-DUR,KLOR-CON) 20 MEQ tablet Take 20 mEq by mouth daily.      predniSONE (DELTASONE) 5 MG tablet Take 1 tablet by mouth daily.     tacrolimus (PROGRAF) 1 MG capsule Take 2 mg by mouth 2 (two) times daily.      VENTOLIN HFA 108 (90 Base) MCG/ACT inhaler      budesonide-formoterol (SYMBICORT) 160-4.5 MCG/ACT inhaler Inhale 2 puffs into the lungs 2 (two) times daily. (Patient not taking: Reported on 03/10/2020) 1 Inhaler 5   predniSONE (DELTASONE) 5 MG tablet Take 1 tablet (5 mg total) by mouth daily with breakfast. Please resume after you have completed prednisone taper (Patient not taking: Reported on 03/10/2020)     No facility-administered medications prior to visit.    ROS See HPI  Objective:  BP 118/70 (BP Location: Left Arm, Patient Position: Sitting, Cuff Size: Normal)    Pulse 70    Temp (!) 96.9 F (36.1 C) (Temporal)    Wt 142 lb 12.8 oz (64.8 kg)    SpO2 94%    BMI 23.76 kg/m   Physical Exam Constitutional:      General: She is not in acute distress.    Appearance: She is not toxic-appearing.  Cardiovascular:     Pulses: Normal pulses.     Heart sounds: Normal heart sounds.  Pulmonary:     Effort: Pulmonary effort is normal.     Breath sounds: Rales present. No wheezing.  Abdominal:     General: There is no distension.  Musculoskeletal:     Right lower leg: Edema present.     Left lower leg: Edema present.  Neurological:     Mental Status: She is alert.      Assessment & Plan:  This visit occurred during the SARS-CoV-2 public health emergency.  Safety protocols were in place, including screening questions prior to the visit, additional usage of staff PPE, and extensive cleaning of exam room while observing appropriate contact time as indicated for disinfecting solutions.   [name] was seen today for acute visit.  Diagnoses and all orders for this visit:  Acute congestive heart failure, unspecified heart failure type ([place]) -     DG Chest 2 View -     Cancel: CBC with Differential/Platelet -     Cancel: Pro b natriuretic peptide -     Cancel: Basic metabolic panel -     Pro b natriuretic peptide; Future -     CBC w/Diff; Future -     Basic metabolic panel; Future  Community acquired pneumonia of right lower lobe of lung -     doxycycline (VIBRA-TABS) 100 MG tablet; Take 1 tablet (100 mg total) by mouth 2 (two) times daily for 7 days.  CXR indicates new RLL infiltrate.  Pending lab results start doxycycline, f/up with pulmonology levaquin not used to possible DDI interaction with prograf (QT prolongation)  Problem List Items Addressed This Visit      Cardiovascular and Mediastinum   Acute CHF (congestive heart failure) (HCC) - Primary    Worsening SOB and chest congestion x 2weeks. Reports compliance with diet and medications. No weight gain noted today Diffuse rales upon auscultation She is noted in respiratory distress, no use of accessory muscles. O2 Sat on RA 94% She has ongoing home PT sessions, last session on Tuesday, she was able to complete session but took more breaks than usual. Wt Readings from Last 3 Encounters:  03/10/20 142 lb 12.8 oz (64.8 kg)  02/04/20 145 lb (65.8 kg)  01/28/20 144 lb (65.3 kg)   BP Readings from Last 3 Encounters:  03/10/20 118/70  02/04/20 (!) 140/82  01/28/20 126/70   Unable to increase current furosemide dose (80mg  BID) due to CKD and kidney transplant. Left voicemessage with CHF  clinic for consultation request. Check cbc, bmp and BNP Get CXR      Relevant Orders   DG Chest 2 View (Completed)   Pro b natriuretic peptide   CBC w/Diff   Basic metabolic panel    Other Visit Diagnoses    Community acquired pneumonia of right lower lobe of lung       Relevant Medications   doxycycline (VIBRA-TABS) 100 MG tablet       Follow-up: No follow-ups on file.  Wilfred Lacy,  Orland Mustard, NP

## 2020-03-11 ENCOUNTER — Inpatient Hospital Stay (HOSPITAL_COMMUNITY)
Admission: EM | Admit: 2020-03-11 | Discharge: 2020-03-13 | DRG: 177 | Disposition: A | Discharge status: Home Health | Payer: Medicare Other | Attending: Internal Medicine | Admitting: Internal Medicine

## 2020-03-11 ENCOUNTER — Inpatient Hospital Stay (HOSPITAL_COMMUNITY): Payer: Medicare Other

## 2020-03-11 ENCOUNTER — Emergency Department (HOSPITAL_COMMUNITY): Payer: Medicare Other

## 2020-03-11 ENCOUNTER — Encounter (HOSPITAL_BASED_OUTPATIENT_CLINIC_OR_DEPARTMENT_OTHER): Payer: Medicare Other | Admitting: Internal Medicine

## 2020-03-11 ENCOUNTER — Other Ambulatory Visit: Payer: Self-pay

## 2020-03-11 ENCOUNTER — Other Ambulatory Visit: Payer: Self-pay | Admitting: *Deleted

## 2020-03-11 ENCOUNTER — Telehealth: Payer: Self-pay | Admitting: Nurse Practitioner

## 2020-03-11 ENCOUNTER — Encounter (HOSPITAL_COMMUNITY): Payer: Self-pay | Admitting: Emergency Medicine

## 2020-03-11 DIAGNOSIS — J9621 Acute and chronic respiratory failure with hypoxia: Secondary | ICD-10-CM | POA: Diagnosis present

## 2020-03-11 DIAGNOSIS — L97813 Non-pressure chronic ulcer of other part of right lower leg with necrosis of muscle: Secondary | ICD-10-CM | POA: Diagnosis not present

## 2020-03-11 DIAGNOSIS — F329 Major depressive disorder, single episode, unspecified: Secondary | ICD-10-CM | POA: Diagnosis present

## 2020-03-11 DIAGNOSIS — U071 COVID-19: Principal | ICD-10-CM | POA: Diagnosis present

## 2020-03-11 DIAGNOSIS — D849 Immunodeficiency, unspecified: Secondary | ICD-10-CM | POA: Diagnosis present

## 2020-03-11 DIAGNOSIS — Z95828 Presence of other vascular implants and grafts: Secondary | ICD-10-CM

## 2020-03-11 DIAGNOSIS — Z992 Dependence on renal dialysis: Secondary | ICD-10-CM

## 2020-03-11 DIAGNOSIS — Z940 Kidney transplant status: Secondary | ICD-10-CM | POA: Diagnosis not present

## 2020-03-11 DIAGNOSIS — Z96641 Presence of right artificial hip joint: Secondary | ICD-10-CM | POA: Diagnosis present

## 2020-03-11 DIAGNOSIS — Z86718 Personal history of other venous thrombosis and embolism: Secondary | ICD-10-CM | POA: Diagnosis not present

## 2020-03-11 DIAGNOSIS — N289 Disorder of kidney and ureter, unspecified: Secondary | ICD-10-CM | POA: Diagnosis not present

## 2020-03-11 DIAGNOSIS — R531 Weakness: Secondary | ICD-10-CM | POA: Diagnosis not present

## 2020-03-11 DIAGNOSIS — J811 Chronic pulmonary edema: Secondary | ICD-10-CM | POA: Diagnosis not present

## 2020-03-11 DIAGNOSIS — I5032 Chronic diastolic (congestive) heart failure: Secondary | ICD-10-CM | POA: Diagnosis present

## 2020-03-11 DIAGNOSIS — D631 Anemia in chronic kidney disease: Secondary | ICD-10-CM | POA: Diagnosis present

## 2020-03-11 DIAGNOSIS — Z9851 Tubal ligation status: Secondary | ICD-10-CM

## 2020-03-11 DIAGNOSIS — Z8673 Personal history of transient ischemic attack (TIA), and cerebral infarction without residual deficits: Secondary | ICD-10-CM

## 2020-03-11 DIAGNOSIS — I739 Peripheral vascular disease, unspecified: Secondary | ICD-10-CM | POA: Diagnosis present

## 2020-03-11 DIAGNOSIS — F419 Anxiety disorder, unspecified: Secondary | ICD-10-CM | POA: Diagnosis present

## 2020-03-11 DIAGNOSIS — Z888 Allergy status to other drugs, medicaments and biological substances status: Secondary | ICD-10-CM

## 2020-03-11 DIAGNOSIS — Z86711 Personal history of pulmonary embolism: Secondary | ICD-10-CM | POA: Diagnosis not present

## 2020-03-11 DIAGNOSIS — Z7952 Long term (current) use of systemic steroids: Secondary | ICD-10-CM

## 2020-03-11 DIAGNOSIS — E875 Hyperkalemia: Secondary | ICD-10-CM | POA: Diagnosis present

## 2020-03-11 DIAGNOSIS — I509 Heart failure, unspecified: Secondary | ICD-10-CM | POA: Diagnosis not present

## 2020-03-11 DIAGNOSIS — J189 Pneumonia, unspecified organism: Secondary | ICD-10-CM

## 2020-03-11 DIAGNOSIS — G4733 Obstructive sleep apnea (adult) (pediatric): Secondary | ICD-10-CM | POA: Diagnosis present

## 2020-03-11 DIAGNOSIS — Z8249 Family history of ischemic heart disease and other diseases of the circulatory system: Secondary | ICD-10-CM

## 2020-03-11 DIAGNOSIS — N184 Chronic kidney disease, stage 4 (severe): Secondary | ICD-10-CM | POA: Diagnosis present

## 2020-03-11 DIAGNOSIS — J1282 Pneumonia due to coronavirus disease 2019: Secondary | ICD-10-CM | POA: Diagnosis present

## 2020-03-11 DIAGNOSIS — Z9884 Bariatric surgery status: Secondary | ICD-10-CM

## 2020-03-11 DIAGNOSIS — N179 Acute kidney failure, unspecified: Secondary | ICD-10-CM | POA: Diagnosis present

## 2020-03-11 DIAGNOSIS — J45909 Unspecified asthma, uncomplicated: Secondary | ICD-10-CM | POA: Diagnosis present

## 2020-03-11 DIAGNOSIS — N170 Acute kidney failure with tubular necrosis: Secondary | ICD-10-CM | POA: Diagnosis not present

## 2020-03-11 DIAGNOSIS — Z950 Presence of cardiac pacemaker: Secondary | ICD-10-CM | POA: Diagnosis not present

## 2020-03-11 DIAGNOSIS — I129 Hypertensive chronic kidney disease with stage 1 through stage 4 chronic kidney disease, or unspecified chronic kidney disease: Secondary | ICD-10-CM | POA: Diagnosis not present

## 2020-03-11 DIAGNOSIS — I619 Nontraumatic intracerebral hemorrhage, unspecified: Secondary | ICD-10-CM | POA: Diagnosis present

## 2020-03-11 DIAGNOSIS — Z79899 Other long term (current) drug therapy: Secondary | ICD-10-CM

## 2020-03-11 DIAGNOSIS — R0602 Shortness of breath: Secondary | ICD-10-CM | POA: Diagnosis not present

## 2020-03-11 DIAGNOSIS — E7800 Pure hypercholesterolemia, unspecified: Secondary | ICD-10-CM | POA: Diagnosis present

## 2020-03-11 DIAGNOSIS — I6930 Unspecified sequelae of cerebral infarction: Secondary | ICD-10-CM | POA: Diagnosis present

## 2020-03-11 DIAGNOSIS — I10 Essential (primary) hypertension: Secondary | ICD-10-CM | POA: Diagnosis present

## 2020-03-11 DIAGNOSIS — J9611 Chronic respiratory failure with hypoxia: Secondary | ICD-10-CM | POA: Diagnosis present

## 2020-03-11 DIAGNOSIS — I110 Hypertensive heart disease with heart failure: Secondary | ICD-10-CM | POA: Diagnosis not present

## 2020-03-11 DIAGNOSIS — N186 End stage renal disease: Secondary | ICD-10-CM | POA: Diagnosis present

## 2020-03-11 DIAGNOSIS — I130 Hypertensive heart and chronic kidney disease with heart failure and stage 1 through stage 4 chronic kidney disease, or unspecified chronic kidney disease: Secondary | ICD-10-CM | POA: Diagnosis present

## 2020-03-11 DIAGNOSIS — I517 Cardiomegaly: Secondary | ICD-10-CM | POA: Diagnosis not present

## 2020-03-11 DIAGNOSIS — I5020 Unspecified systolic (congestive) heart failure: Secondary | ICD-10-CM | POA: Diagnosis not present

## 2020-03-11 LAB — CBC
HCT: 40.2 % (ref 36.0–46.0)
HCT: 38.8 % (ref 36.0–46.0)
Hemoglobin: 12.3 g/dL (ref 12.0–15.0)
Hemoglobin: 11.7 g/dL — ABNORMAL LOW (ref 12.0–15.0)
MCH: 25.3 pg — ABNORMAL LOW (ref 26.0–34.0)
MCH: 25.2 pg — ABNORMAL LOW (ref 26.0–34.0)
MCHC: 30.6 g/dL (ref 30.0–36.0)
MCHC: 30.2 g/dL (ref 30.0–36.0)
MCV: 82.7 fL (ref 80.0–100.0)
MCV: 83.6 fL (ref 80.0–100.0)
Platelets: 217 10*3/uL (ref 150–400)
Platelets: 203 10*3/uL (ref 150–400)
RBC: 4.86 MIL/uL (ref 3.87–5.11)
RBC: 4.64 MIL/uL (ref 3.87–5.11)
RDW: 16 % — ABNORMAL HIGH (ref 11.5–15.5)
RDW: 16 % — ABNORMAL HIGH (ref 11.5–15.5)
WBC: 8.3 10*3/uL (ref 4.0–10.5)
WBC: 6.6 10*3/uL (ref 4.0–10.5)
nRBC: 0 % (ref 0.0–0.2)
nRBC: 0 % (ref 0.0–0.2)

## 2020-03-11 LAB — GLUCOSE, RANDOM: Glucose, Bld: 149 mg/dL — ABNORMAL HIGH (ref 70–99)

## 2020-03-11 LAB — COMPREHENSIVE METABOLIC PANEL
ALT: 23 U/L (ref 0–44)
AST: 36 U/L (ref 15–41)
Albumin: 3.6 g/dL (ref 3.5–5.0)
Alkaline Phosphatase: 96 U/L (ref 38–126)
Anion gap: 11 (ref 5–15)
BUN: 72 mg/dL — ABNORMAL HIGH (ref 8–23)
CO2: 29 mmol/L (ref 22–32)
Calcium: 9.3 mg/dL (ref 8.9–10.3)
Chloride: 97 mmol/L — ABNORMAL LOW (ref 98–111)
Creatinine, Ser: 2.81 mg/dL — ABNORMAL HIGH (ref 0.44–1.00)
GFR calc Af Amer: 19 mL/min — ABNORMAL LOW (ref >60)
GFR calc non Af Amer: 16 mL/min — ABNORMAL LOW (ref >60)
Glucose, Bld: 140 mg/dL — ABNORMAL HIGH (ref 70–99)
Potassium: 6.2 mmol/L — ABNORMAL HIGH (ref 3.5–5.1)
Sodium: 137 mmol/L (ref 135–145)
Total Bilirubin: 0.9 mg/dL (ref 0.3–1.2)
Total Protein: 6.9 g/dL (ref 6.5–8.1)

## 2020-03-11 LAB — FERRITIN: Ferritin: 360 ng/mL — ABNORMAL HIGH (ref 11–307)

## 2020-03-11 LAB — BRAIN NATRIURETIC PEPTIDE: B Natriuretic Peptide: 943.2 pg/mL — ABNORMAL HIGH (ref 0.0–100.0)

## 2020-03-11 LAB — D-DIMER, QUANTITATIVE: D-Dimer, Quant: 1.28 ug/mL-FEU — ABNORMAL HIGH (ref 0.00–0.50)

## 2020-03-11 LAB — TROPONIN I (HIGH SENSITIVITY)
Troponin I (High Sensitivity): 40 ng/L — ABNORMAL HIGH (ref <18)
Troponin I (High Sensitivity): 40 ng/L — ABNORMAL HIGH (ref <18)

## 2020-03-11 LAB — CREATININE, SERUM
Creatinine, Ser: 2.78 mg/dL — ABNORMAL HIGH (ref 0.44–1.00)
GFR calc Af Amer: 19 mL/min — ABNORMAL LOW (ref >60)
GFR calc non Af Amer: 16 mL/min — ABNORMAL LOW (ref >60)

## 2020-03-11 LAB — CBG MONITORING, ED: Glucose-Capillary: 138 mg/dL — ABNORMAL HIGH (ref 70–99)

## 2020-03-11 LAB — ABO/RH: ABO/RH(D): O POS

## 2020-03-11 LAB — SARS CORONAVIRUS 2 BY RT PCR (HOSPITAL ORDER, PERFORMED IN ~~LOC~~ HOSPITAL LAB): SARS Coronavirus 2: POSITIVE — AB

## 2020-03-11 LAB — C-REACTIVE PROTEIN: CRP: 13.1 mg/dL — ABNORMAL HIGH (ref <1.0)

## 2020-03-11 IMAGING — US US RENAL TRANSPLANT
1 series · 13 of 18 positions shown · non-contrast
Comparison: [DATE]

CLINICAL DATA: Acute renal failure

EXAM:
ULTRASOUND OF RENAL TRANSPLANT WITH RENAL DOPPLER ULTRASOUND
TECHNIQUE: Ultrasound examination of the renal transplant was performed with
gray-scale, color and duplex doppler evaluation.

[Series 1: us renal transplant · 13 of 18 slices shown]
[im 1/18]
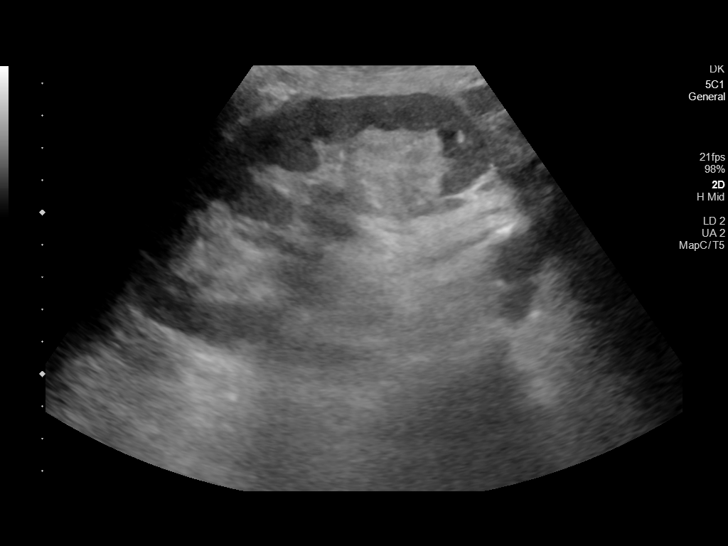
[im 3/18]
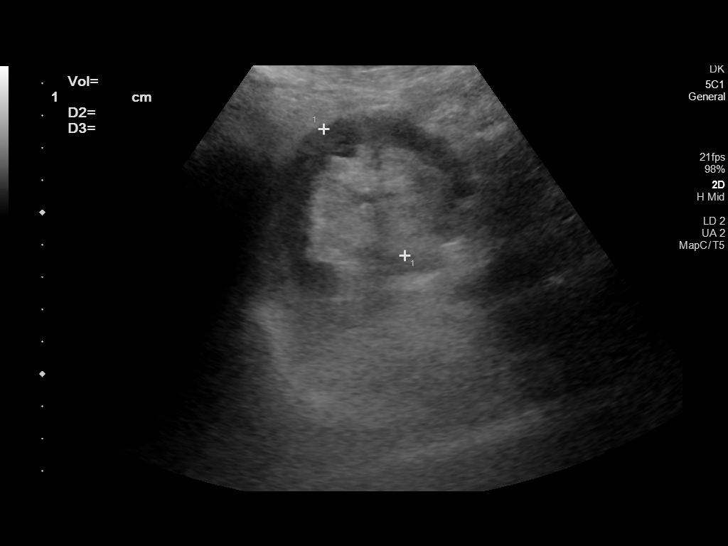
[im 4/18]
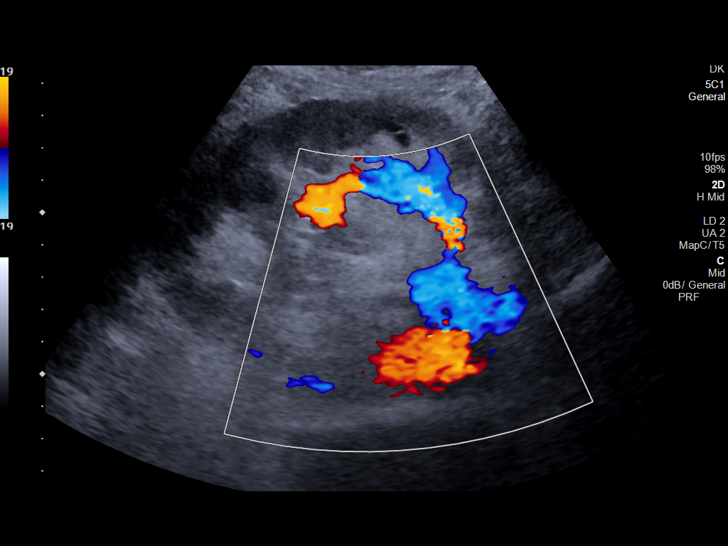
[im 5/18]
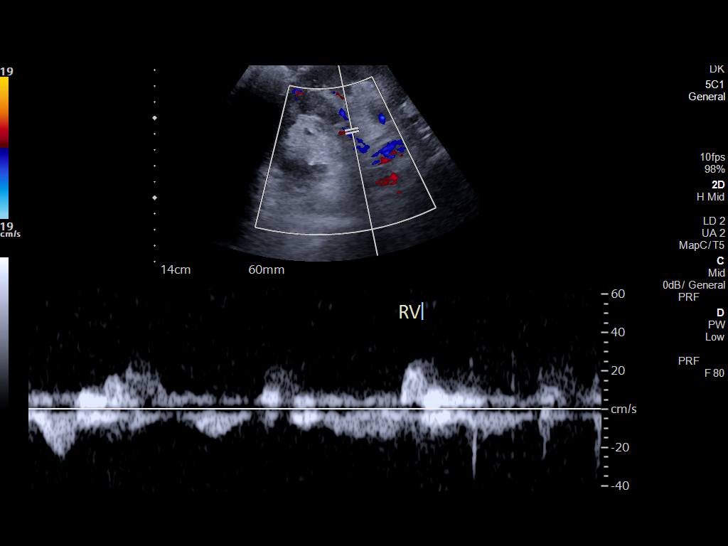
[im 7/18]
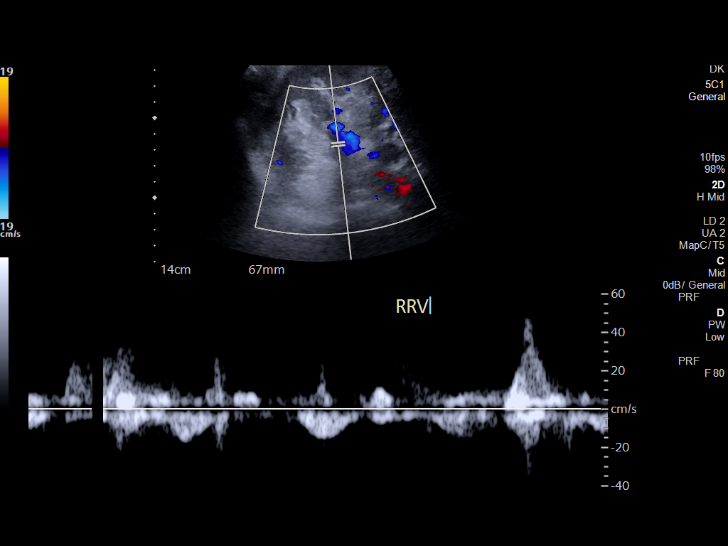
[im 8/18]
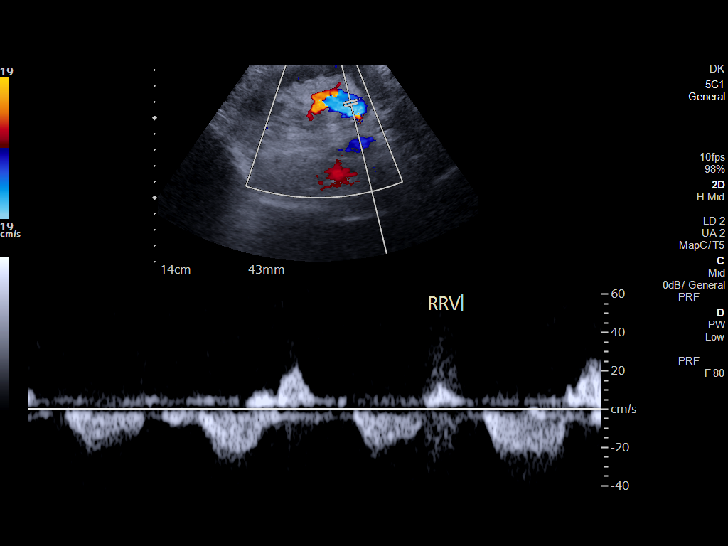
[im 10/18]
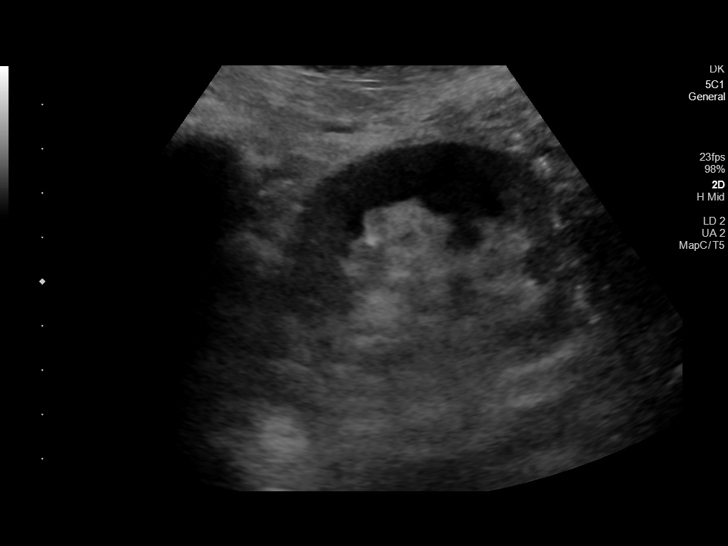
[im 11/18]
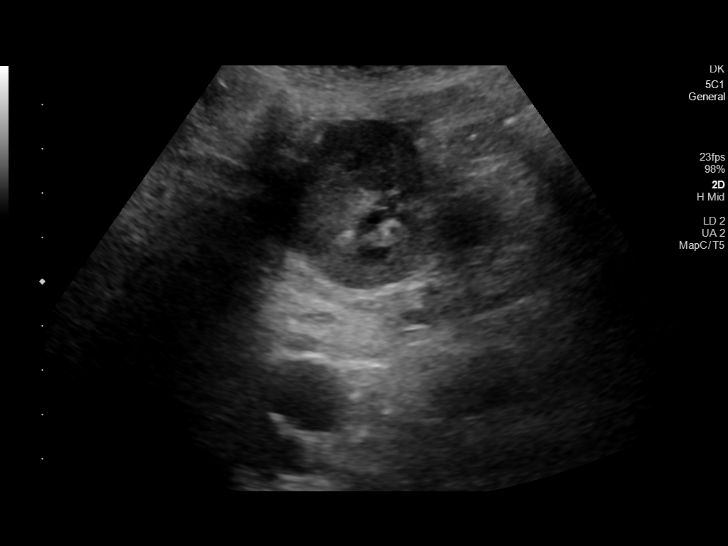
[im 12/18]
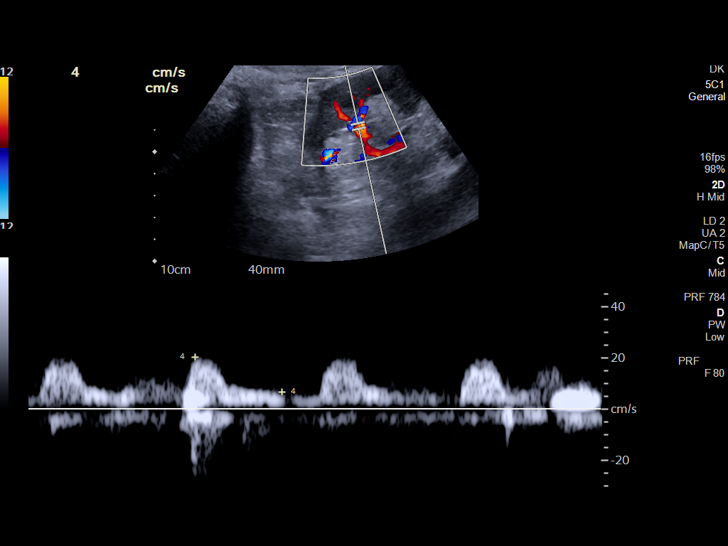
[im 14/18]
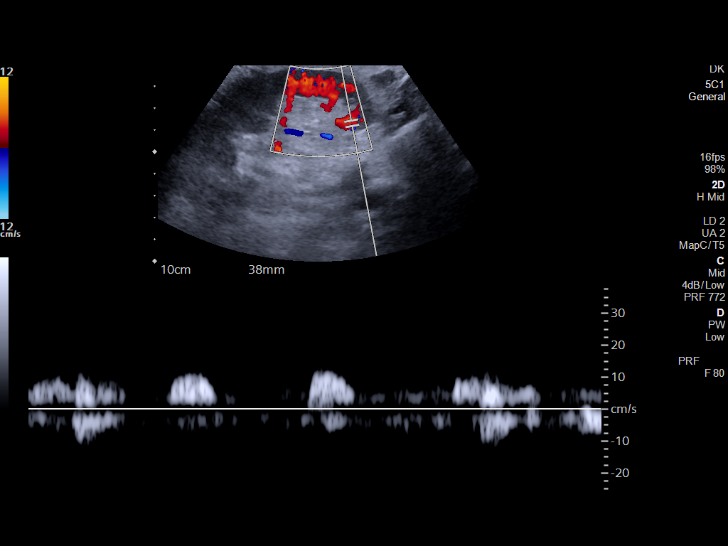
[im 15/18]
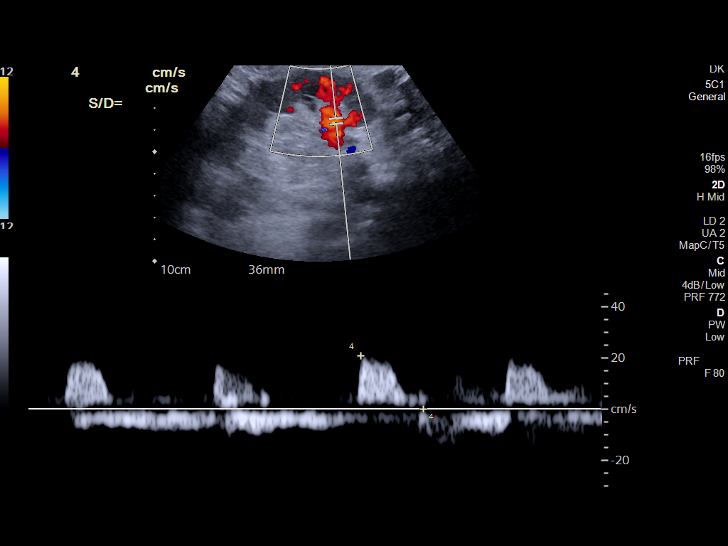
[im 16/18]
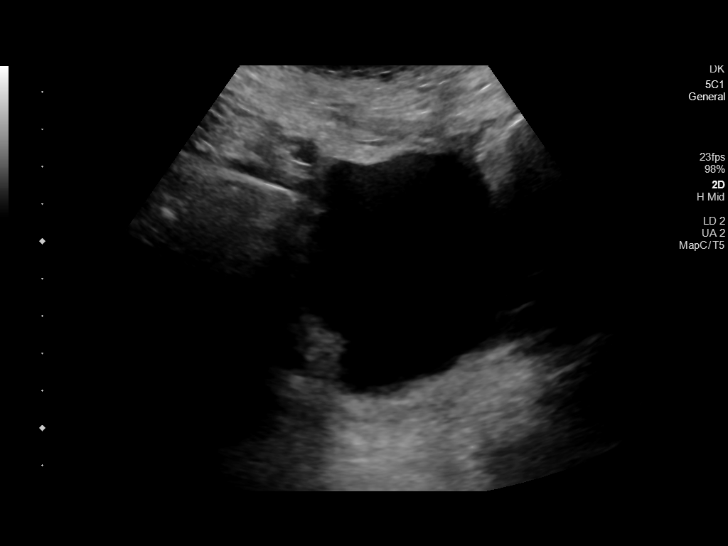
[im 18/18]
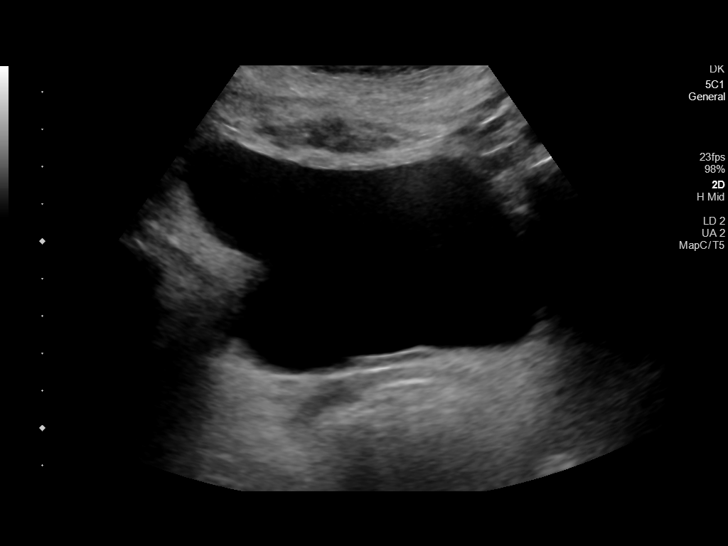

[13 of 18 positions shown; findings below may reference images not displayed]

FINDINGS: Transplant kidney location: RLQ

Transplant Kidney:

Renal measurements: 11.9 cm x 4.6 cm x 4.7 cm = volume: 128.64mL.
Normal in size and parenchymal echogenicity. No evidence of mass or
hydronephrosis. No peri-transplant fluid collection seen.

Color flow in the main renal artery:  Yes

Color flow in the main renal vein:  Yes

Duplex Doppler Evaluation:

Main Renal Artery Velocity: 22 cm/sec

Main Renal Artery Resistive Index: N/A

Venous waveform in main renal vein:  present

Intrarenal resistive index in upper pole:

(normal 0.6-0.8; equivocal 0.8-0.9; abnormal >= 0.9)

Intrarenal resistive index in lower pole:

(normal 0.6-0.8; equivocal 0.8-0.9; abnormal >= 0.9)

Bladder: Normal for degree of bladder distention.

Other findings:  None.
IMPRESSION: 1. Normal appearance of the right lower quadrant renal transplant
without evidence of hydronephrosis.
2. Elevated lower pole intrarenal resistive index.

## 2020-03-11 IMAGING — CR DG CHEST 2V
2 series · 2 of 2 positions shown · non-contrast
Comparison: [DATE]

CLINICAL DATA: Shortness of breath.

EXAM:
CHEST - 2 VIEW

[w chest pa]
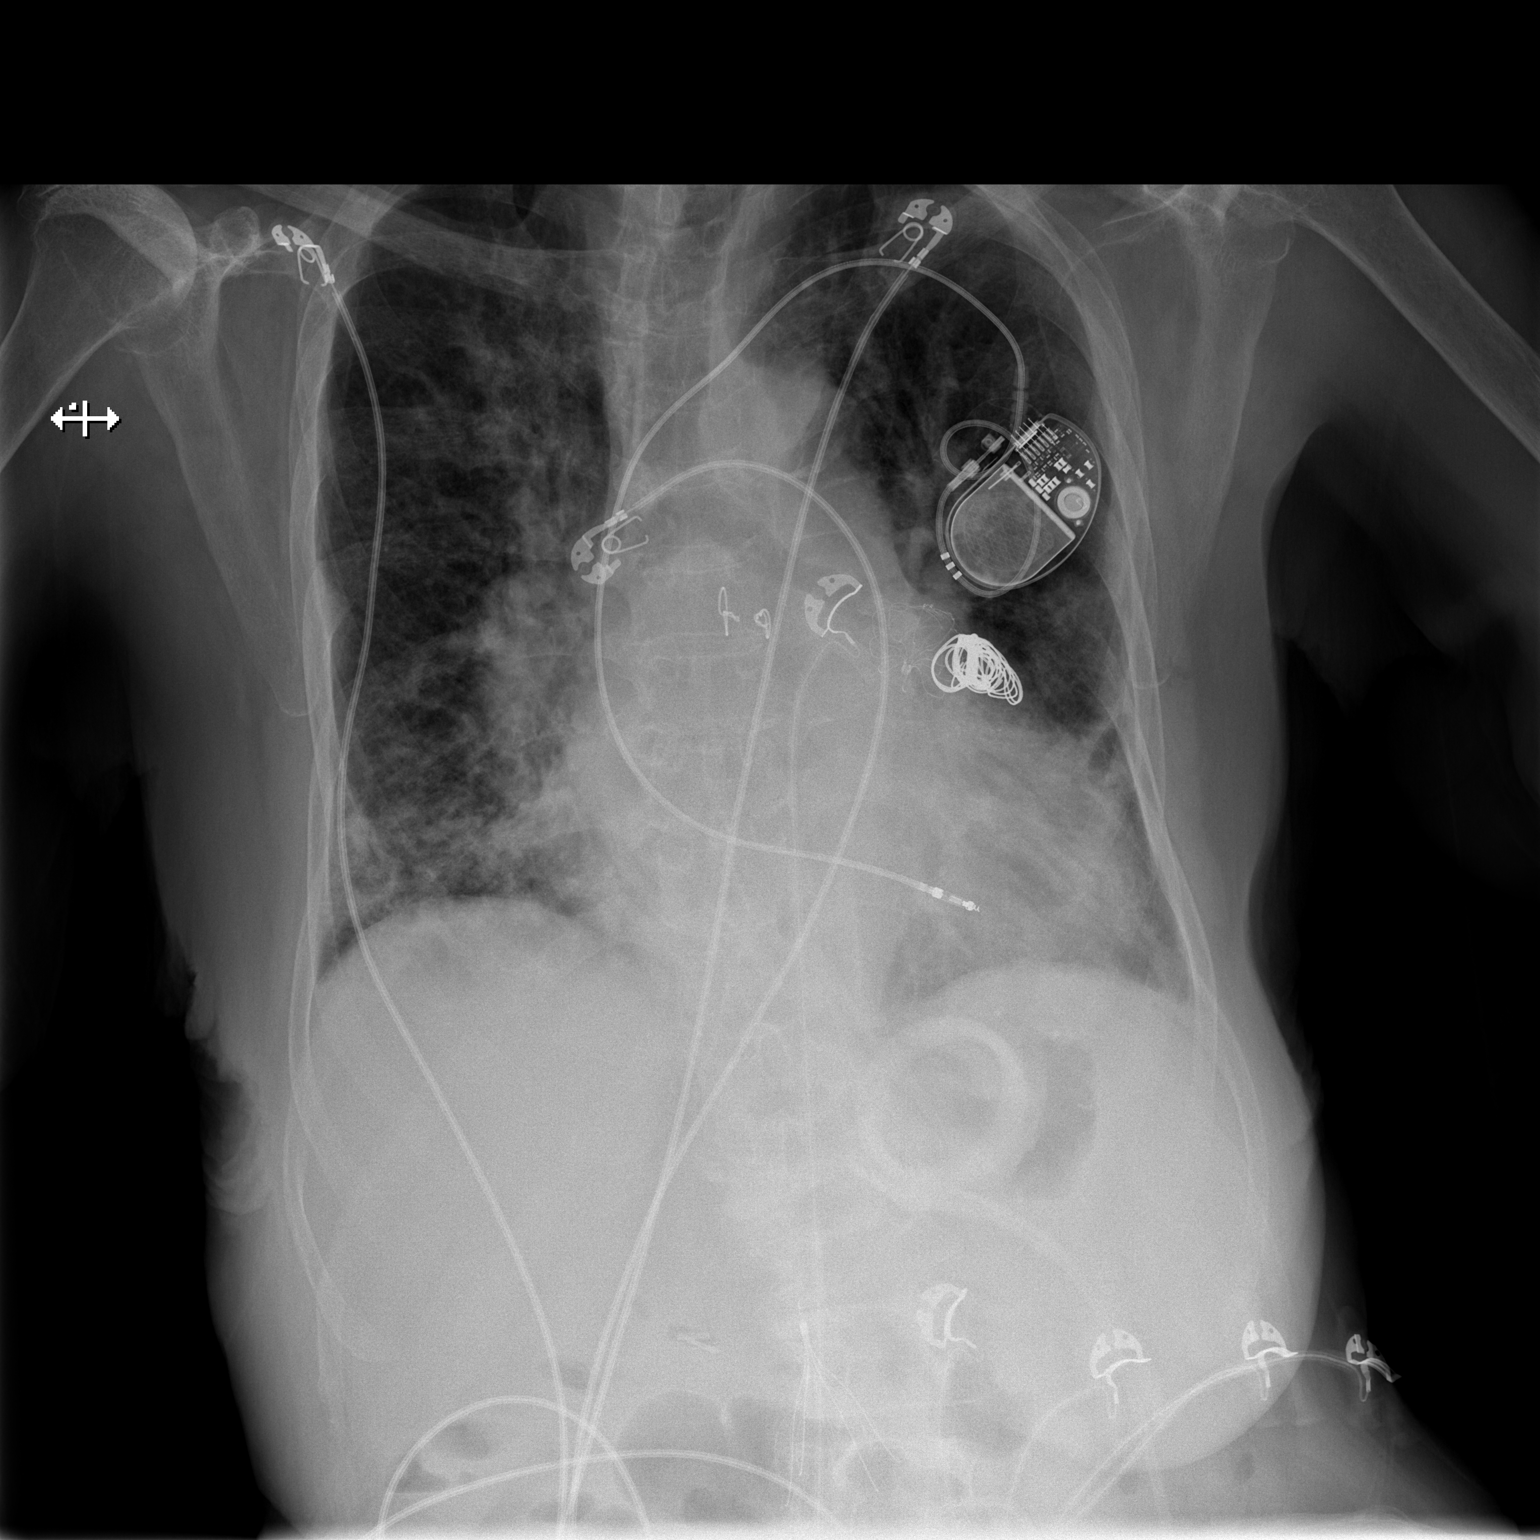

[w chest lat]
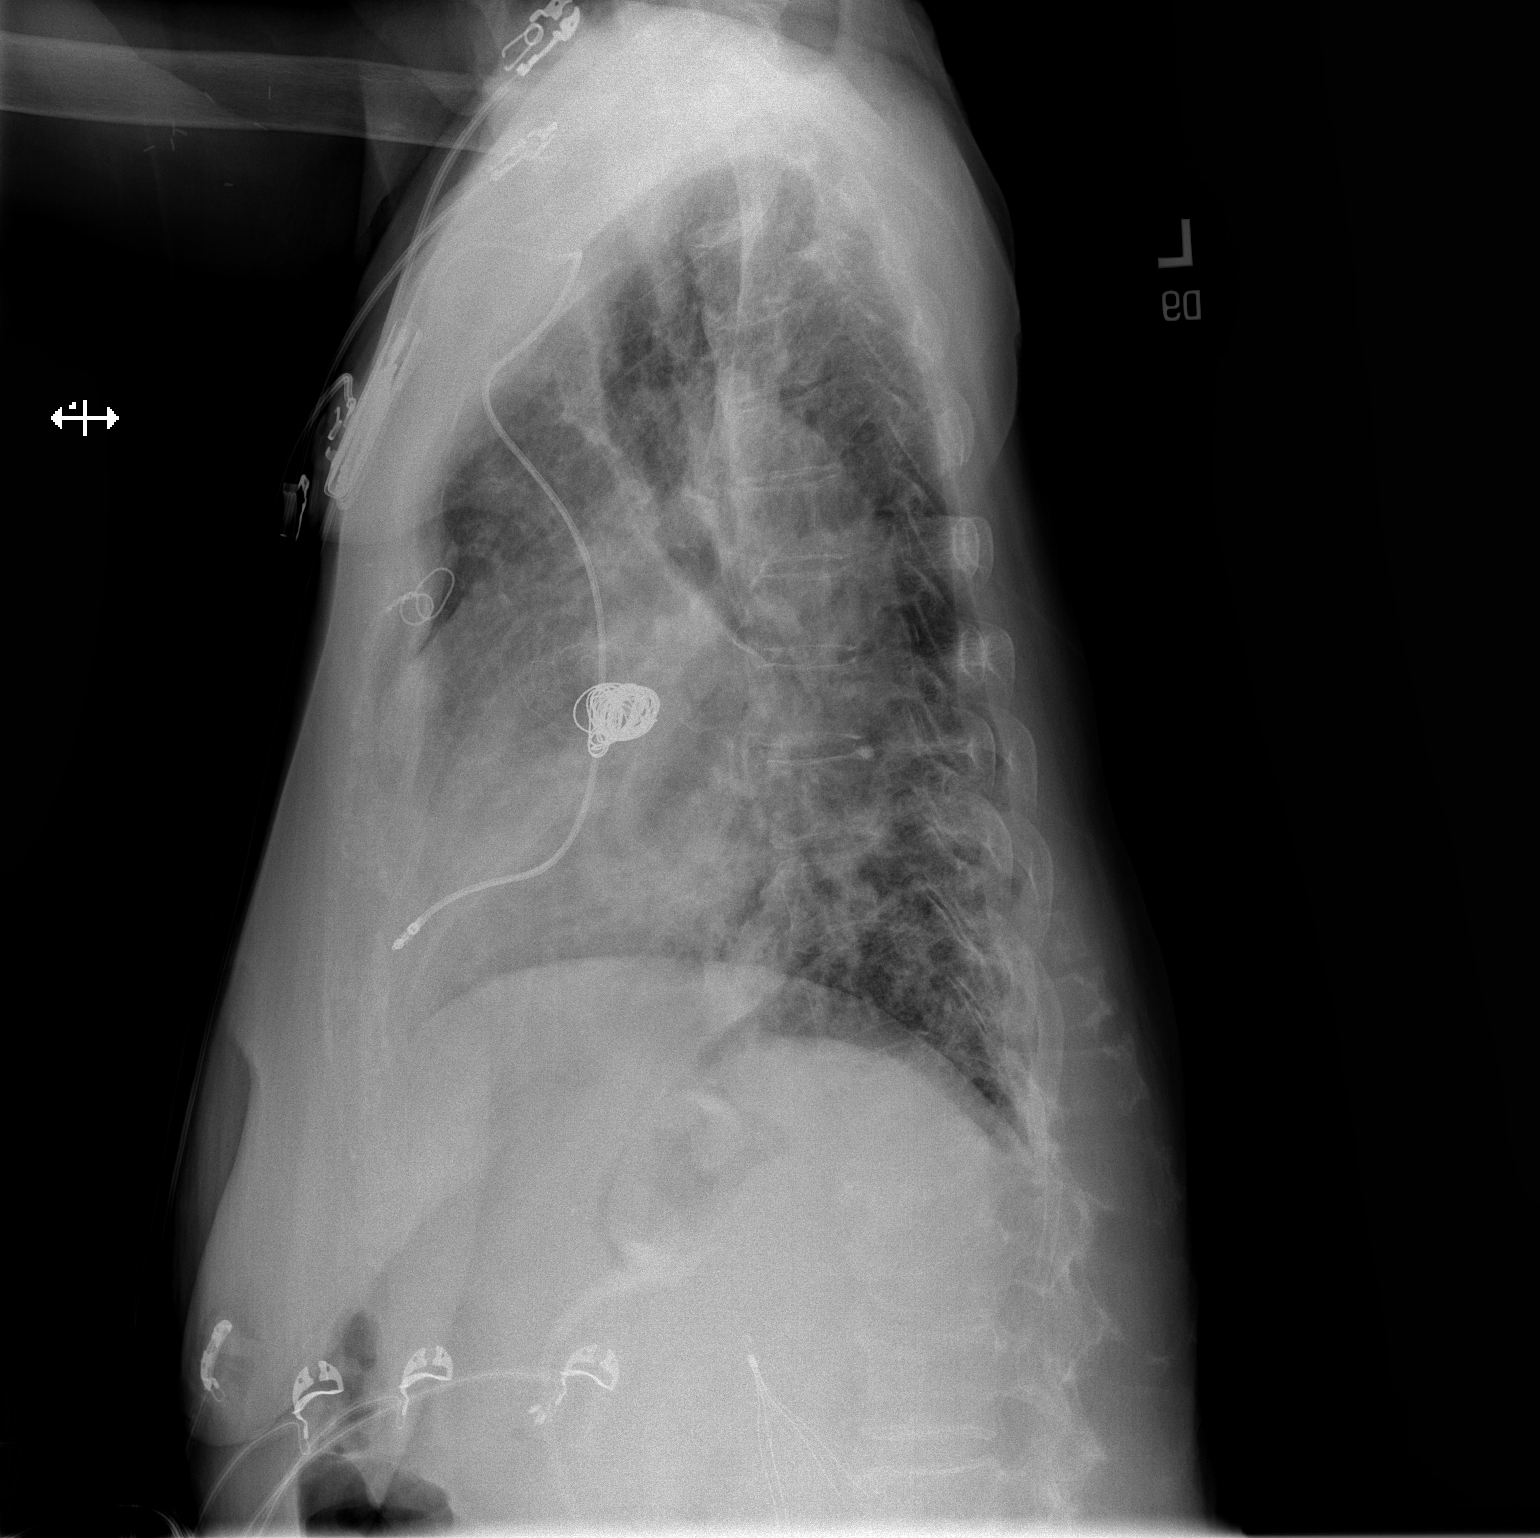

[2 of 2 positions shown; findings below may reference images not displayed]

FINDINGS: [1Q] hours. The cardio pericardial silhouette is enlarged.
Interstitial markings are diffusely coarsened with chronic features.
There is pulmonary vascular congestion without overt pulmonary
edema. Bibasilar airspace disease is stable to minimally
progressive. No pleural effusion. Status post left atrial appendage
occlusion. Single lead pacer remains in place. Telemetry leads
overlie the chest. Lap band noted left upper quadrant and IVC filter
overlies the upper lumbar spine.
IMPRESSION: Cardiomegaly with hyperinflation and chronic interstitial changes.

Patchy airspace disease at the bases, stable to minimally
progressed.

## 2020-03-11 MED ORDER — SODIUM CHLORIDE 0.9 % IV SOLN
100.0000 mg | Freq: Every day | INTRAVENOUS | Status: DC
  Administered 2020-03-12 – 2020-03-13 (×2): 100 mg via INTRAVENOUS
  Filled 2020-03-11 (×2): qty 20

## 2020-03-11 MED ORDER — MYCOPHENOLATE SODIUM 180 MG PO TBEC
180.0000 mg | DELAYED_RELEASE_TABLET | Freq: Two times a day (BID) | ORAL | Status: DC
  Administered 2020-03-11 – 2020-03-13 (×4): 180 mg via ORAL
  Filled 2020-03-11 (×5): qty 1

## 2020-03-11 MED ORDER — ACETAMINOPHEN 325 MG PO TABS: 650.0000 mg | ORAL_TABLET | Freq: Four times a day (QID) | ORAL | Status: DC | PRN

## 2020-03-11 MED ORDER — SODIUM BICARBONATE 8.4 % IV SOLN
50.0000 meq | Freq: Once | INTRAVENOUS | Status: AC
  Administered 2020-03-11: 50 meq via INTRAVENOUS
  Filled 2020-03-11: qty 50

## 2020-03-11 MED ORDER — SODIUM CHLORIDE 0.9 % IV SOLN
200.0000 mg | Freq: Once | INTRAVENOUS | Status: AC
  Administered 2020-03-11: 200 mg via INTRAVENOUS
  Filled 2020-03-11: qty 200

## 2020-03-11 MED ORDER — DULOXETINE HCL 30 MG PO CPEP
30.0000 mg | ORAL_CAPSULE | Freq: Every day | ORAL | Status: DC
  Administered 2020-03-12 – 2020-03-13 (×2): 30 mg via ORAL
  Filled 2020-03-11 (×2): qty 1

## 2020-03-11 MED ORDER — HYDROCOD POLST-CPM POLST ER 10-8 MG/5ML PO SUER: 5.0000 mL | Freq: Two times a day (BID) | ORAL | Status: DC | PRN

## 2020-03-11 MED ORDER — SODIUM CHLORIDE 0.9 % IV SOLN
500.0000 mg | Freq: Once | INTRAVENOUS | Status: AC
  Administered 2020-03-11: 500 mg via INTRAVENOUS
  Filled 2020-03-11: qty 500

## 2020-03-11 MED ORDER — DEXTROSE 50 % IV SOLN
1.0000 | Freq: Once | INTRAVENOUS | Status: AC
  Administered 2020-03-11: 50 mL via INTRAVENOUS
  Filled 2020-03-11: qty 50

## 2020-03-11 MED ORDER — ENOXAPARIN SODIUM 30 MG/0.3ML ~~LOC~~ SOLN
30.0000 mg | SUBCUTANEOUS | Status: DC
  Administered 2020-03-11 – 2020-03-12 (×2): 30 mg via SUBCUTANEOUS
  Filled 2020-03-11 (×2): qty 0.3

## 2020-03-11 MED ORDER — CARVEDILOL 12.5 MG PO TABS
12.5000 mg | ORAL_TABLET | Freq: Two times a day (BID) | ORAL | Status: DC
  Administered 2020-03-11 – 2020-03-13 (×4): 12.5 mg via ORAL
  Filled 2020-03-11 (×4): qty 1

## 2020-03-11 MED ORDER — SODIUM CHLORIDE 0.9 % IV SOLN
1.0000 g | Freq: Once | INTRAVENOUS | Status: AC
  Administered 2020-03-11: 1 g via INTRAVENOUS
  Filled 2020-03-11: qty 10

## 2020-03-11 MED ORDER — ACETAMINOPHEN 650 MG RE SUPP: 650.0000 mg | Freq: Four times a day (QID) | RECTAL | Status: DC | PRN

## 2020-03-11 MED ORDER — ALBUTEROL SULFATE HFA 108 (90 BASE) MCG/ACT IN AERS
4.0000 | INHALATION_SPRAY | Freq: Once | RESPIRATORY_TRACT | Status: AC
  Administered 2020-03-11: 4 via RESPIRATORY_TRACT
  Filled 2020-03-11: qty 6.7

## 2020-03-11 MED ORDER — ZINC SULFATE 220 (50 ZN) MG PO CAPS
220.0000 mg | ORAL_CAPSULE | Freq: Every day | ORAL | Status: DC
  Administered 2020-03-11 – 2020-03-13 (×3): 220 mg via ORAL
  Filled 2020-03-11 (×3): qty 1

## 2020-03-11 MED ORDER — DEXAMETHASONE SODIUM PHOSPHATE 10 MG/ML IJ SOLN
6.0000 mg | INTRAMUSCULAR | Status: DC
  Administered 2020-03-11 – 2020-03-12 (×2): 6 mg via INTRAVENOUS
  Filled 2020-03-11 (×2): qty 1

## 2020-03-11 MED ORDER — ALBUTEROL SULFATE (2.5 MG/3ML) 0.083% IN NEBU: 10.0000 mg | INHALATION_SOLUTION | Freq: Once | RESPIRATORY_TRACT | Status: DC

## 2020-03-11 MED ORDER — SODIUM ZIRCONIUM CYCLOSILICATE 10 G PO PACK
10.0000 g | PACK | Freq: Once | ORAL | Status: AC
  Administered 2020-03-11: 10 g via ORAL
  Filled 2020-03-11: qty 1

## 2020-03-11 MED ORDER — INSULIN ASPART 100 UNIT/ML IV SOLN
5.0000 [IU] | Freq: Once | INTRAVENOUS | Status: AC
  Administered 2020-03-11: 5 [IU] via INTRAVENOUS
  Filled 2020-03-11: qty 0.05

## 2020-03-11 MED ORDER — GUAIFENESIN-DM 100-10 MG/5ML PO SYRP
10.0000 mL | ORAL_SOLUTION | ORAL | Status: DC | PRN
  Administered 2020-03-12: 10 mL via ORAL
  Filled 2020-03-11: qty 10

## 2020-03-11 MED ORDER — TACROLIMUS 1 MG PO CAPS
2.0000 mg | ORAL_CAPSULE | Freq: Two times a day (BID) | ORAL | Status: DC
  Administered 2020-03-11 – 2020-03-13 (×4): 2 mg via ORAL
  Filled 2020-03-11 (×5): qty 2

## 2020-03-11 MED ORDER — IPRATROPIUM BROMIDE HFA 17 MCG/ACT IN AERS
2.0000 | INHALATION_SPRAY | Freq: Once | RESPIRATORY_TRACT | Status: AC
  Administered 2020-03-11: 2 via RESPIRATORY_TRACT
  Filled 2020-03-11: qty 12.9

## 2020-03-11 MED ORDER — ASCORBIC ACID 500 MG PO TABS
500.0000 mg | ORAL_TABLET | Freq: Every day | ORAL | Status: DC
  Administered 2020-03-11 – 2020-03-13 (×3): 500 mg via ORAL
  Filled 2020-03-11 (×3): qty 1

## 2020-03-11 NOTE — ED Notes (Addendum)
Attempted for blood work and IV insertion x2, unable to obtain blood work or access. Phlebotomy called.

## 2020-03-11 NOTE — H&P (Signed)
History and Physical    [name] WUX:[id number] DOB: 1945-08-06 DOA: 03/11/2020  PCP: [name], NP  Patient coming from: Home  Chief Complaint: SOB, congestion  HPI: [name] is a 74 y.o. female with medical history significant of end-stage renal disease status post renal transplant 2017 currently on chronic immunosuppression, atrial fibrillation, pacemaker placement, CKD who initially presented to her primary care provider office on 03/10/2020 with complaints of 2-week history of increased congestion, weakness and shortness of breath.  Patient was noted to be mildly hypoxemic with O2 sat of 94% on room air in the office.  Patient was thought to have acute heart failure and was thus referred to CHF clinic.  On the day of hospital visit, patient went to wound care appointment regarding a wound on her right leg and was there found to be hypoxemic with O2 saturation into the 60s on ambulation.  Patient was therefore referred to the emergency department for further work-up. Of note, pt's husband has been having URI symptoms as well.  ED Course: In the emergency department, patient was noted to have chest x-ray findings suggestive of bilateral infiltrates.  Mildly tachypneic with respiratory rate in excess of 30 breaths/min with O2 saturation in the low 90s while at rest on room air.  She was also noted to have a potassium 6.2 with a creatinine of 2.8.  Nephrology was consulted regarding elevated potassium and patient did later received IV insulin with IV dextrose with IV bicarb.  Covid test later returned positive for COVID-19.  Hospitalist service consulted for consideration for medical admission  Review of Systems:  Review of Systems  Constitutional: Positive for malaise/fatigue. Negative for chills and weight loss.  HENT: Positive for congestion. Negative for ear discharge and ear pain.   Eyes: Negative for double vision, photophobia and pain.  Respiratory: Positive for cough,  sputum production and shortness of breath.   Cardiovascular: Negative for palpitations and orthopnea.  Gastrointestinal: Negative for abdominal pain, diarrhea and vomiting.  Genitourinary: Negative for frequency, hematuria and urgency.  Musculoskeletal: Negative for back pain and neck pain.  Neurological: Negative for tremors, sensory change, seizures and loss of consciousness.  Psychiatric/Behavioral: Negative for hallucinations and memory loss. The patient is not nervous/anxious.     Past Medical History:  Diagnosis Date  . Acute diastolic (congestive) heart failure ([place]) 08/12/2017  . AKI (acute kidney injury) ([place]) 11/04/2017  . Anemia   . Anxiety   . Arthritis    "shoulders; arms; hips" (02/04/2018)  . Asthma   . Atrial fibrillation ([place])   . Atrial flutter [facility])    s/p aflutter ablation at [facility]  . Benign hypertension with ESRD (end-stage renal disease) ([place])   . Blood transfusion    never had a reaction to blood transfusion  . Cardiomyopathy Mar 2013   Mild, EF 50-55% by Mar 2013 ECHO, diast dysfxn II  . Cardiomyopathy ([place])   . CHF (congestive heart failure) ([place]) 2005  . CHF (congestive heart failure) ([place])   . Childhood asthma   . Closed fracture of right distal radius 10/18/2016  . CMV (cytomegalovirus infection) ([place]) 10/03/2015  . Constipation    takes Miralax daily  . Constipation   . Depression    takes Zoloft daily  . Distal radius fracture, right   . DVT of lower extremity, bilateral ([place]) 12/21/11   "they're there now; been there for 2 wks"  . Eczema   . ESRD (end stage renal disease) on dialysis ([place])  09/2011   07-19-2015 had Kidney transplant at [facility]; "don't get dialysis anymore" (02/04/2018)  . Fractures, stress    in both feet--6 OR 7 YRS AGO--RESOLVED  . Generalized edema [id number]  . Gout    doesn't require meds   . HCAP (healthcare-associated pneumonia) 09/10/2011  . Hearing difficulty   . High cholesterol   . History of CVA (cerebrovascular  accident) without residual deficits   . History of hip replacement, total, right   . History of pneumonia   . Hx of Clostridium difficile infection   . Hypercalcemia    09/10/11  . Hyperparathyroidism ([place]) 04/07/2013  . Hypertension    takes Diltiazem daily   . Hypomagnesemia 08/02/2015  . Hypophosphatemia 11/08/2017  . ICB (intracranial bleed) ([place])   . Immunosuppression ([place]) 07/25/2015  . Memory changes   . Morbid obesity ([place])   . Nonischemic dilated cardiomyopathy ([place])   . Obesity 04/07/2013  . Oligouria   . OSA on CPAP   . PAF (paroxysmal atrial fibrillation) (HCC)     HX OF CEREBRAL BLEED WHILE ON COUMADIN-SO PT NOT ON ANY BLOOD THINNERS NOW  . Peripheral vascular disease ([place])   . Presence of Watchman left atrial appendage closure device 10/04/2017  . Proteinuria 09/04/2016  . Psoriasis 08/07/2016  . 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 S/P insertion of IVC (inferior vena caval) filter   . Sepsis ([place]) 11/08/2017  . Stress fracture    bilateral feet  . Stroke [facility]) 2009   denies residual on 02/04/2018;  hemorrhagic now off coumadin  . Stroke due to intracerebral hemorrhage ([place]) 2009  . Suture reaction 09/04/2016  . Transfusion history   . Use of cane as ambulatory aid     Past Surgical History:  Procedure Laterality Date  . AV FISTULA PLACEMENT  09/28/2011   Procedure: ARTERIOVENOUS (AV) FISTULA CREATION;  Surgeon: Rosetta Posner, MD;  Location: [place];  Service: Vascular;  Laterality: Left;  . CARDIAC CATHETERIZATION    . CARDIAC VALVE SURGERY  1960    FOR ATRIAL SEPTAL DEFECT  . CHOLECYSTECTOMY     1980's  . COLONOSCOPY    . COLONOSCOPY WITH PROPOFOL N/A 11/16/2019   Procedure: COLONOSCOPY WITH PROPOFOL;  Surgeon: Juanita Craver, MD;  Location: [facility] ENDOSCOPY;  Service: Endoscopy;  Laterality: N/A;  . CYSTOSCOPY     many yrs ago  . ESOPHAGOGASTRODUODENOSCOPY (EGD) WITH PROPOFOL N/A 11/16/2019   Procedure: ESOPHAGOGASTRODUODENOSCOPY (EGD) WITH PROPOFOL;   Surgeon: Juanita Craver, MD;  Location[facility] ENDOSCOPY;  Service: Endoscopy;  Laterality: N/A;  . FEMUR FRACTURE SURGERY Right 03/2011; 09/03/2011   "had 2, 2 wk apart in 2012; broke it again 08/2011 & had OR"  . FRACTURE SURGERY    . I & D EXTREMITY  09/15/2011   Procedure: IRRIGATION AND DEBRIDEMENT EXTREMITY w REMOVAL OF HARDWARE;  Surgeon: [name], MD;  Location: [place];  Service: Orthopedics;  Laterality: Right;  . INCISION AND DRAINAGE HIP  12/21/2011   Procedure: IRRIGATION AND DEBRIDEMENT HIP;  Surgeon: [name], MD;  Location: [place];  Service: Orthopedics;  Laterality: Right;  I&D RIGHT HIP WITH PLACEMENT ANTIBIOTIC SPACER  . INSERTION OF DIALYSIS CATHETER     Procedure: INSERTION OF DIALYSIS CATHETER;  Surgeon: Rosetta Posner, MD;  Location: [place];  Service: Vascular;  Laterality: Right;  . IRRIGATION AND DEBRIDEMENT ABSCESS  12/21/11   right hip  . JOINT REPLACEMENT    . KIDNEY TRANSPLANT  07/19/15  . LAPAROSCOPIC GASTRIC BANDING  2008  . LEFT ATRIAL APPENDAGE OCCLUSION  10/04/2017  . OPEN REDUCTION INTERNAL FIXATION (ORIF) DISTAL RADIAL FRACTURE Right 10/18/2016   Procedure: OPEN REDUCTION INTERNAL FIXATION (ORIF) RIGHT DISTAL RADIAL FRACTURE;  Surgeon: Marchia Bond, MD;  Location: [place];  Service: Orthopedics;  Laterality: Right;  . TOTAL HIP ARTHROPLASTY Right 02/2011   right THA 02/2011, I&D/removal of hardware 09/2011,, repeat I&D Jun 2013, reimplantation R THA 03-26-2012  . TOTAL HIP REVISION  03/25/2012   Procedure: TOTAL HIP REVISION;  Surgeon: [name], MD;  Location: [facility];  Service: Orthopedics;  Laterality: Right;  Right Total Hip Reimplantation  . TUBAL LIGATION    . VENA CAVA FILTER PLACEMENT  11/2011     reports that she has never smoked. She has never used smokeless tobacco. She reports that she does not drink alcohol and does not use drugs.  Allergies  Allergen Reactions  . Ace Inhibitors Other (See Comments)    Reaction unknown  .  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 Warfarin Other (See Comments)    Left side brain hemorrhage  . Warfarin Sodium Other (See Comments)    Caused her to have a stroke    Family History  Problem Relation Age of Onset  . Cancer Father   . Diabetes Mother   . Hypertension Mother   . Arthritis Mother   . Hodgkin's lymphoma Other 32       dscd---HODGKINS DISEASE  . Hypertension Brother     Prior to Admission medications   Medication Sig Start Date End Date Taking? Authorizing Provider  acetaminophen (TYLENOL) 500 MG tablet Take 500 mg by mouth every 6 (six) hours as needed for mild pain or headache.     [provider]  albuterol (PROVENTIL) (2.5 MG/3ML) 0.083% nebulizer solution Take 3 mLs (2.5 mg total) by nebulization every 6 (six) hours as needed for wheezing or shortness of breath. Dx:J45.909 10/25/19   Rai, Vernelle Emerald, MD  budesonide-formoterol (SYMBICORT) 160-4.5 MCG/ACT inhaler Inhale 2 puffs into the lungs 2 (two) times daily. Patient not taking: Reported on 03/10/2020 12/02/19   [name], MD  calcitRIOL (ROCALTROL) 0.25 MCG capsule Take 0.25 mcg by mouth every other day. 12/13/19   [provider]  carvedilol (COREG) 12.5 MG tablet Take 2 tablets (25 mg total) by mouth 2 (two) times daily. 11/18/19   Aline August, MD  docusate sodium (COLACE) 100 MG capsule Take 100 mg by mouth daily as needed (For constipation.).     [provider]  doxycycline (VIBRA-TABS) 100 MG tablet Take 1 tablet (100 mg total) by mouth 2 (two) times daily for 7 days. 03/10/20 03/17/20  [name], [name], NP  DULoxetine (CYMBALTA) 30 MG capsule TAKE 1 CAPSULE BY MOUTH EVERY DAY 11/20/19   [name], [name], NP  Ferrous Sulfate (IRON PO) Take 325 mg by mouth daily.     [provider]  furosemide (LASIX) 80 MG tablet Take 1 tablet (80 mg total) by mouth 2 (two) times daily. 10/25/19   Rai, Ripudeep K, MD    magnesium oxide (MAG-OX) 400 MG tablet Take 2 tablets by mouth 2 (two) times daily. 10/18/19   [provider]  mycophenolate (MYFORTIC) 180 MG EC tablet Take 180 mg by mouth 2 (two) times daily.    [provider]  ondansetron (ZOFRAN) 4 MG tablet Take 1 tablet (4 mg total) by mouth every  6 (six) hours as needed for nausea. 11/18/19   Aline August, MD  potassium chloride SA (K-DUR,KLOR-CON) 20 MEQ tablet Take 20 mEq by mouth daily.     [provider]  predniSONE (DELTASONE) 5 MG tablet Take 1 tablet by mouth daily. 06/26/19 06/25/20  [provider]  tacrolimus (PROGRAF) 1 MG capsule Take 2 mg by mouth 2 (two) times daily.     [provider]  VENTOLIN HFA 108 [phone] Base) MCG/ACT inhaler  10/21/19   [provider]    Physical Exam: Vitals:   03/11/20 1233 03/11/20 1235 03/11/20 1300 03/11/20 1405  BP: (!) 145/87  129/81 114/77  Pulse: 68  73 71  Resp: (!) 30  (!) 31 (!) 30  Temp:    98.2 F (36.8 C)  TempSrc:    Oral  SpO2: 92%  92% 100%  Weight:  64.4 kg    Height:  5\' 5"  (1.651 m)      Constitutional: NAD, calm, comfortable Vitals:   03/11/20 1233 03/11/20 1235 03/11/20 1300 03/11/20 1405  BP: (!) 145/87  129/81 114/77  Pulse: 68  73 71  Resp: (!) 30  (!) 31 (!) 30  Temp:    98.2 F (36.8 C)  TempSrc:    Oral  SpO2: 92%  92% 100%  Weight:  64.4 kg    Height:  5\' 5"  (1.651 m)     Eyes: PERRL, lids and conjunctivae normal ENMT: Posterior pharynx clear of any exudate or lesions.Normal dentition.  Neck: normal, supple, no masses, Respiratory: coarse breath sounds. Actively coughing Cardiovascular: perfused, no notable jvd Abdomen: nondistended, nontender Musculoskeletal: no clubbing / cyanosis. No joint deformity upper and lower extremities. Good ROM, no contractures. Normal muscle tone.  Skin: no rashes, lesions, ulcers. No induration Neurologic: CN 2-12 grossly intact. Sensation intact, no tremors Psychiatric: Normal  judgment and insight. Alert and oriented x 3. Normal mood.    Labs on Admission: I have personally reviewed following labs and imaging studies  CBC: Recent Labs  Lab 03/11/20 1029  WBC 8.3  HGB 12.3  HCT 40.2  MCV 82.7  PLT 789   Basic Metabolic Panel: Recent Labs  Lab 03/11/20 1030  NA 137  K 6.2*  CL 97*  CO2 29  GLUCOSE 140*  BUN 72*  CREATININE 2.81*  CALCIUM 9.3   GFR: Estimated Creatinine Clearance: 16.5 mL/min (A) (by C-G formula based on SCr of 2.81 mg/dL (H)). Liver Function Tests: Recent Labs  Lab 03/11/20 1030  AST 36  ALT 23  ALKPHOS 96  BILITOT 0.9  PROT 6.9  ALBUMIN 3.6   No results for input(s): LIPASE, AMYLASE in the last 168 hours. No results for input(s): AMMONIA in the last 168 hours. Coagulation Profile: No results for input(s): INR, PROTIME in the last 168 hours. Cardiac Enzymes: No results for input(s): CKTOTAL, CKMB, CKMBINDEX, TROPONINI in the last 168 hours. BNP (last 3 results) No results for input(s): PROBNP in the last 8760 hours. HbA1C: No results for input(s): HGBA1C in the last 72 hours. CBG: No results for input(s): GLUCAP in the last 168 hours. Lipid Profile: No results for input(s): CHOL, HDL, LDLCALC, TRIG, CHOLHDL, LDLDIRECT in the last 72 hours. Thyroid Function Tests: No results for input(s): TSH, T4TOTAL, FREET4, T3FREE, THYROIDAB in the last 72 hours. Anemia Panel: No results for input(s): VITAMINB12, FOLATE, FERRITIN, TIBC, IRON, RETICCTPCT in the last 72 hours. Urine analysis:    Component Value Date/Time   COLORURINE AMBER (A) 02/04/2018 1540  APPEARANCEUR CLOUDY (A) 02/04/2018 1540   LABSPEC 1.017 02/04/2018 1540   PHURINE 5.0 02/04/2018 1540   GLUCOSEU NEGATIVE 02/04/2018 1540   HGBUR LARGE (A) 02/04/2018 1540   HGBUR negative 10/04/2009 1044   BILIRUBINUR NEGATIVE 02/04/2018 1540   BILIRUBINUR negative 12/13/2017 1508   KETONESUR NEGATIVE 02/04/2018 1540   PROTEINUR 100 (A) 02/04/2018 1540    UROBILINOGEN 0.2 12/13/2017 1508   UROBILINOGEN 0.2 03/25/2012 1354   NITRITE NEGATIVE 02/04/2018 1540   LEUKOCYTESUR MODERATE (A) 02/04/2018 1540   Sepsis Labs: !!!!!!!!!!!!!!!!!!!!!!!!!!!!!!!!!!!!!!!!!!!! @LABRCNTIP (procalcitonin:4,lacticidven:4) ) Recent Results (from the past 240 hour(s))  SARS Coronavirus 2 by RT PCR (hospital order, performed in [facility] hospital lab) Nasopharyngeal Nasopharyngeal Swab     Status: Abnormal   Collection Time: 03/11/20 10:44 AM   Specimen: Nasopharyngeal Swab  Result Value Ref Range Status   SARS Coronavirus 2 POSITIVE (A) NEGATIVE Final    Comment: CRITICAL RESULT CALLED TO, READ BACK BY AND VERIFIED WITH: BALDWIN,L. RN @1343  03/11/20 BILLINGSLEY,L (NOTE) SARS-CoV-2 target nucleic acids are DETECTED  SARS-CoV-2 RNA is generally detectable in upper respiratory specimens  during the acute phase of infection.  Positive results are indicative  of the presence of the identified virus, but do not rule out bacterial infection or co-infection with other pathogens not detected by the test.  Clinical correlation with patient history and  other diagnostic information is necessary to determine patient infection status.  The expected result is negative.  Fact Sheet for Patients:   StrictlyIdeas.no   Fact Sheet for Healthcare Providers:   BankingDealers.co.za    This test is not yet approved or cleared by the [place] FDA and  has been authorized for detection and/or diagnosis of SARS-CoV-2 by FDA under an Emergency Use Authorization (EUA).  This EUA will remain in effect  (meaning this test can be used) for the duration of  the COVID-19 declaration under Section 564(b)(1) of the Act, 21 U.S.C. section 360-bbb-3(b)(1), unless the authorization is terminated or revoked sooner.  Performed at [facility], [place] [address], [place], [place] [address]      Radiological Exams on  Admission: DG Chest 2 View  Result Date: 03/11/2020 CLINICAL DATA:  Shortness of breath. EXAM: CHEST - 2 VIEW COMPARISON:  03/10/2020 FINDINGS: 0946 hours. The cardio pericardial silhouette is enlarged. Interstitial markings are diffusely coarsened with chronic features. There is pulmonary vascular congestion without overt pulmonary edema. Bibasilar airspace disease is stable to minimally progressive. No pleural effusion. Status post left atrial appendage occlusion. Single lead pacer remains in place. Telemetry leads overlie the chest. Lap band noted left upper quadrant and IVC filter overlies the upper lumbar spine. IMPRESSION: Cardiomegaly with hyperinflation and chronic interstitial changes. Patchy airspace disease at the bases, stable to minimally progressed. Electronically Signed   By: Misty Stanley M.D.   On: 03/11/2020 09:59   DG Chest 2 View  Result Date: 03/10/2020 CLINICAL DATA:  Shortness of breath. EXAM: CHEST - 2 VIEW COMPARISON:  02/27/2020. FINDINGS: Cardiac pacer with lead tip over the right ventricle. Postsurgical changes noted of chest with occlusion device and coils noted over the left chest. Chronic interstitial changes again noted. Active right base infiltrate cannot be excluded. No pleural effusion or pneumothorax. Lap band noted stable position. IMPRESSION: 1. Cardiac pacer in stable position. Stable postsurgical changes noted the chest. Stable cardiomegaly. 2. Chronic interstitial lung disease. Superimposed acute infiltrate right lung base cannot be excluded. Electronically Signed   By: Marcello Moores  Register   On: 03/10/2020  12:24    EKG: Independently reviewed. Paced with QTc 490 on most recent ekg  Assessment/Plan Principal Problem:   Pneumonia due to COVID-19 virus Active Problems:   Essential hypertension   CVA (cerebrovascular accident due to intracerebral hemorrhage) (HCC)   End stage renal disease ([place])   Renal transplant recipient   Chronic respiratory failure with  hypoxia (HCC)   Hyperkalemia   Personal history of PE (pulmonary embolism)   Immunosuppression (HCC)   Cardiac pacemaker in situ   SOB (shortness of breath)   1. Pneumonia secondary to COVID-19 1. Chart reviewed.  Patient was seen recently at PCP office on 03/10/2020 with complaints of 2-week history of shortness of breath and chest congestion.  Was also noted to be mildly hypoxemic, thought to have acute heart failure at that time 2. In the emergency department, patient has tested positive for COVID-19. 3. Chest x-ray reviewed, suggestive of bilateral infiltrates 4. Tachypneic with respiratory rates as high as 31 breaths/min with O2 saturation down to 87% briefly. 5. Patient is fully vaccinated with the [place] vaccine 6. We will check inflammatory markers 7. For now, will start patient on remdesivir pharmacy to dose as well as IV steroids 8. Follow serial inflammatory markers 9. Repeat CBC and basic metabolic panel in the morning 2. Hypertension 1. Blood pressure appears stable at present 2. Would continue home regimen as tolerated 3. History of CVA 1. Seems to be stable currently 4. History of ESRD status post renal transplant 1. Chart reviewed.  Patient is status post renal transplant at [facility] in 2017. 2. Renal function reviewed, current creatinine seems to be stable thus far 3. Nephrology consulted per EDP 5. Chronic immunosuppression 1. Patient is on chronic immunosuppression for history of renal transplant 6. Hyperkalemia 1. Patient mildly hyperkalemic with potassium in excess of 6 in emergency department. 2. IV insulin, albuterol, IV sodium bicarb given in emergency department 3. Follow electrolyte trends 7. History of PE/DVT 1. Chart reviewed, patient is status post IVC filter, currently not on anticoagulation 2. Of note, patient noted to have history of intracranial hemorrhage in 2009 8. History of pacemaker placement 1. Seems to be stable at present,  currently paced on EKG  DVT prophylaxis: lovenox subq  Code Status: Full Family Communication: Pt in room  Disposition Plan:   Consults called: Nephrology Admission status: Inpatient as will likely require greater than 2 midnight stay to treat COVID-19 pneumonia  Marylu Lund MD [facility] Pager On Amion  If 7PM-7AM, please contact night-coverage  03/11/2020, 2:45 PM

## 2020-03-11 NOTE — Patient Outreach (Signed)
[facility]) Care Management  03/11/2020  [name] Aug 19, 1945 [id number]   Received in-basket message via  [place] / Epic, regarding patient's ED admit / hospitalization.  Patient has been reassigned to another RNCM Tomasa Rand) on 02/26/2020, update sent to Estill Bamberg  and Estill Bamberg will follow up on further care management  needs.  Plan: RNCM will remove name from care team.    Colbert Coyer. Annia Friendly, BSN, [place] Management [facility] Telephonic CM Phone: [phone] Fax: [phone]

## 2020-03-11 NOTE — ED Provider Notes (Signed)
[facility] DEPT Provider Note   CSN: [id number] Arrival date & time: 03/11/20  0900     History Chief Complaint  Patient presents with  . Shortness of Breath    [name] is a 74 y.o. female with a history of CHF with last EF 55-60%, S/p pacemaker, S/p renal transplant on prograf/prednisone/mychophenolate, hyperthyroidism, prior DVT, stroke, afib not anticoagulated, hypertension, asthma, & OSA on CPAP who presents to the ED with complaints of progressively worsening dyspnea x 2 weeks. Patient has felt short of breath, especially with activity, with associated non productive cough & orthopnea. Using her rescue inhaler at home more frequently than usual. She was seen by her PCP office yesterday, had CXR concerning for pneumonia was given a prescription for doxycycline which she has not picked up yet as the pharmacy did not call her to tell her it is ready.  Today she went to a wound care appointment for her right lower leg and was found to be hypoxic into the 60s on room air after ambulating into the facility, she was placed on oxygen and recommended to come to the emergency department.  She denies fever, nausea, vomiting, chest pain, abdominal pain, or syncope. Denies acute change in baseline LE swelling, hemoptysis, recent surgery/trauma, recent long travel, hormone use, personal history of cancer.  Patient is fully vaccinated against COVID-19.  She is on 80 mg of Lasix twice daily and has not missed any doses. HPI     Past Medical History:  Diagnosis Date  . Acute diastolic (congestive) heart failure ([place]) 08/12/2017  . AKI (acute kidney injury) ([place]) 11/04/2017  . Anemia   . Anxiety   . Arthritis    "shoulders; arms; hips" (02/04/2018)  . Asthma   . Atrial fibrillation ([place])   . Atrial flutter [facility])    s/p aflutter ablation at [facility]  . Benign hypertension with ESRD (end-stage renal disease) ([place])   . Blood transfusion    never had a reaction to  blood transfusion  . Cardiomyopathy Mar 2013   Mild, EF 50-55% by Mar 2013 ECHO, diast dysfxn II  . Cardiomyopathy ([place])   . CHF (congestive heart failure) ([place]) 2005  . CHF (congestive heart failure) ([place])   . Childhood asthma   . Closed fracture of right distal radius 10/18/2016  . CMV (cytomegalovirus infection) ([place]) 10/03/2015  . Constipation    takes Miralax daily  . Constipation   . Depression    takes Zoloft daily  . Distal radius fracture, right   . DVT of lower extremity, bilateral ([place]) 12/21/11   "they're there now; been there for 2 wks"  . Eczema   . ESRD (end stage renal disease) on dialysis [facility]) 09/2011   07-19-2015 had Kidney transplant at [facility]; "don't get dialysis anymore" (02/04/2018)  . Fractures, stress    in both feet--6 OR 7 YRS AGO--RESOLVED  . Generalized edema [id number]  . Gout    doesn't require meds   . HCAP (healthcare-associated pneumonia) 09/10/2011  . Hearing difficulty   . High cholesterol   . History of CVA (cerebrovascular accident) without residual deficits   . History of hip replacement, total, right   . History of pneumonia   . Hx of Clostridium difficile infection   . Hypercalcemia    09/10/11  . Hyperparathyroidism ([place]) 04/07/2013  . Hypertension    takes Diltiazem daily   . Hypomagnesemia 08/02/2015  . Hypophosphatemia 11/08/2017  . ICB (intracranial bleed) ([place])   . Immunosuppression (  [place]) 07/25/2015  . Memory changes   . Morbid obesity ([place])   . Nonischemic dilated cardiomyopathy ([place])   . Obesity 04/07/2013  . Oligouria   . OSA on CPAP   . PAF (paroxysmal atrial fibrillation) (HCC)     HX OF CEREBRAL BLEED WHILE ON COUMADIN-SO PT NOT ON ANY BLOOD THINNERS NOW  . Peripheral vascular disease ([place])   . Presence of Watchman left atrial appendage closure device 10/04/2017  . Proteinuria 09/04/2016  . Psoriasis 08/07/2016  . 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 S/P insertion of IVC (inferior vena caval) filter   . Sepsis ([place])  11/08/2017  . Stress fracture    bilateral feet  . Stroke [facility]) 2009   denies residual on 02/04/2018;  hemorrhagic now off coumadin  . Stroke due to intracerebral hemorrhage ([place]) 2009  . Suture reaction 09/04/2016  . Transfusion history   . Use of cane as ambulatory aid     Patient Active Problem List   Diagnosis Date Noted  . Wound of right leg 01/28/2020  . Traumatic seroma of lower leg, right, subsequent encounter 01/28/2020  . Other secondary pulmonary hypertension ([place]) 12/03/2019  . Acute GI bleeding 11/14/2019  . GI bleed 11/14/2019  . Medication management 11/04/2019  . Acute CHF (congestive heart failure) ([place]) 10/23/2019  . Acute on chronic combined systolic and diastolic congestive heart failure ([place]) 10/22/2019  . S/P AV nodal ablation 10/08/2018  . Cardiac pacemaker in situ 04/18/2018  . Oral mucositis 02/22/2018  . 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 Personal history of PE (pulmonary embolism) 02/22/2018  . Acute on chronic systolic heart failure ([place]) 02/11/2018  . Hyperkalemia 02/03/2018  . Atrial fibrillation with RVR ([place]) 02/03/2018  . Lower extremity edema 12/10/2017  . Acute kidney injury superimposed on chronic kidney disease ([place]) 11/17/2017  . Acute diastolic (congestive) heart failure ([place]) 11/17/2017  . UTI (urinary tract infection) 11/03/2017  . Diarrhea 11/03/2017  . Presence of Watchman left atrial appendage closure device 10/04/2017  . Chronic respiratory failure with hypoxia ([place]) 10/03/2017  . Persistent atrial fibrillation ([place]) 08/12/2017  . Dyspnea 07/26/2017  . Atrial flutter ([place])   . Peripheral vascular disease ([place])   . Oligouria   . Hypertension   . Fractures, stress   . Eczema   . Distal radius fracture, right   . Depression   . Constipation   . Arthritis   . Exacerbation of asthma 04/01/2017  . Closed fracture of right distal radius 10/18/2016  . Proteinuria 09/04/2016  . 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  Immunosuppression ([place]) 07/25/2015  . Nonischemic dilated cardiomyopathy ([place])   . S/P insertion of IVC (inferior vena caval) filter 04/07/2013  . Use of cane as ambulatory aid 04/07/2013  . H/O: stroke 04/07/2013  . Expected blood loss anemia 03/26/2012  . S/P hip replacement 03/25/2012  . DVT of lower extremity, bilateral ([place]) 12/21/2011  . Personal history of DVT (deep vein thrombosis) 12/08/2011  . Obstructive sleep apnea 12/08/2011  . End stage renal disease ([place]) 11/06/2011  . 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[place]) 10/24/2011  . Pseudomonas aeruginosa infection 10/24/2011  . ESRD (end stage renal disease) on dialysis ([place]) 09/14/2011  . Hypercalcemia   . Gout 09/10/2011  . Absence of right hip joint with antibiotic pacer 09/03/2011  . Traumatic seroma of thigh ([place]) 06/11/2011  . Clostridium difficile colitis 06/09/2011  . Leukocytosis 06/08/2011  . Anemia 06/06/2011  . ARF (acute renal failure) ([place]) 06/06/2011  . SINUSITIS- ACUTE-NOS  09/27/2010  . PAF (paroxysmal atrial fibrillation) ([place]) 11/25/2008  . RENAL FAILURE, ACUTE 11/25/2008  . CVA 09/28/2008  . CVA (cerebrovascular accident due to intracerebral hemorrhage) ([place]) 09/28/2008  . WEAKNESS, LEFT SIDE OF BODY 09/21/2008  . Morbid obesity ([place]) 10/20/2007  . Psoriasis 10/20/2007  . Stroke due to intracerebral hemorrhage ([place]) 07/17/2007  . Stroke ([place]) 07/17/2007  . Essential hypertension 07/07/2007  . Asthmatic bronchitis without complication 09/62/8366    Past Surgical History:  Procedure Laterality Date  . AV FISTULA PLACEMENT  09/28/2011   Procedure: ARTERIOVENOUS (AV) FISTULA CREATION;  Surgeon: Rosetta Posner, MD;  Location: [facility];  Service: Vascular;  Laterality: Left;  . CARDIAC CATHETERIZATION    . CARDIAC VALVE SURGERY  1960    FOR ATRIAL SEPTAL DEFECT  . CHOLECYSTECTOMY     1980's  . COLONOSCOPY    . COLONOSCOPY WITH PROPOFOL N/A 11/16/2019   Procedure: COLONOSCOPY WITH PROPOFOL;  Surgeon: Juanita Craver, MD;   Location: [facility] ENDOSCOPY;  Service: Endoscopy;  Laterality: N/A;  . CYSTOSCOPY     many yrs ago  . ESOPHAGOGASTRODUODENOSCOPY (EGD) WITH PROPOFOL N/A 11/16/2019   Procedure: ESOPHAGOGASTRODUODENOSCOPY (EGD) WITH PROPOFOL;  Surgeon: Juanita Craver, MD;  Location: [facility] ENDOSCOPY;  Service: Endoscopy;  Laterality: N/A;  . FEMUR FRACTURE SURGERY Right 03/2011; 09/03/2011   "had 2, 2 wk apart in 2012; broke it again 08/2011 & had OR"  . FRACTURE SURGERY    . I & D EXTREMITY  09/15/2011   Procedure: IRRIGATION AND DEBRIDEMENT EXTREMITY w REMOVAL OF HARDWARE;  Surgeon: [name], MD;  Location: [facility];  Service: Orthopedics;  Laterality: Right;  . INCISION AND DRAINAGE HIP  12/21/2011   Procedure: IRRIGATION AND DEBRIDEMENT HIP;  Surgeon: [name], MD;  Location: [facility];  Service: Orthopedics;  Laterality: Right;  I&D RIGHT HIP WITH PLACEMENT ANTIBIOTIC SPACER  . INSERTION OF DIALYSIS CATHETER     Procedure: INSERTION OF DIALYSIS CATHETER;  Surgeon: Rosetta Posner, MD;  Location: [facility];  Service: Vascular;  Laterality: Right;  . IRRIGATION AND DEBRIDEMENT ABSCESS  12/21/11   right hip  . JOINT REPLACEMENT    . KIDNEY TRANSPLANT  07/19/15  . LAPAROSCOPIC GASTRIC BANDING  2008  . LEFT ATRIAL APPENDAGE OCCLUSION  10/04/2017  . OPEN REDUCTION INTERNAL FIXATION (ORIF) DISTAL RADIAL FRACTURE Right 10/18/2016   Procedure: OPEN REDUCTION INTERNAL FIXATION (ORIF) RIGHT DISTAL RADIAL FRACTURE;  Surgeon: Marchia Bond, MD;  Location: [facility];  Service: Orthopedics;  Laterality: Right;  . TOTAL HIP ARTHROPLASTY Right 02/2011   right THA 02/2011, I&D/removal of hardware 09/2011,, repeat I&D Jun 2013, reimplantation R THA 03-26-2012  . TOTAL HIP REVISION  03/25/2012   Procedure: TOTAL HIP REVISION;  Surgeon: [name], MD;  Location: [facility];  Service: Orthopedics;  Laterality: Right;  Right Total Hip Reimplantation  . TUBAL LIGATION    . VENA CAVA FILTER PLACEMENT  11/2011     OB History   No obstetric  history on file.     Family History  Problem Relation Age of Onset  . Cancer Father   . Diabetes Mother   . Hypertension Mother   . Arthritis Mother   . Hodgkin's lymphoma Other 32       dscd---HODGKINS DISEASE  . Hypertension Brother     Social History   Tobacco Use  . Smoking status: Never Smoker  . Smokeless tobacco: Never Used  Vaping Use  . Vaping Use: Never used  Substance  Use Topics  . Alcohol use: Never  . Drug use: Never    Home Medications Prior to Admission medications   Medication Sig Start Date End Date Taking? Authorizing Provider  acetaminophen (TYLENOL) 500 MG tablet Take 500 mg by mouth every 6 (six) hours as needed for mild pain or headache.     [provider]  albuterol (PROVENTIL) (2.5 MG/3ML) 0.083% nebulizer solution Take 3 mLs (2.5 mg total) by nebulization every 6 (six) hours as needed for wheezing or shortness of breath. Dx:J45.909 10/25/19   Rai, Vernelle Emerald, MD  budesonide-formoterol (SYMBICORT) 160-4.5 MCG/ACT inhaler Inhale 2 puffs into the lungs 2 (two) times daily. Patient not taking: Reported on 03/10/2020 12/02/19   [name], MD  calcitRIOL (ROCALTROL) 0.25 MCG capsule Take 0.25 mcg by mouth every other day. 12/13/19   [provider]  carvedilol (COREG) 12.5 MG tablet Take 2 tablets (25 mg total) by mouth 2 (two) times daily. 11/18/19   Aline August, MD  docusate sodium (COLACE) 100 MG capsule Take 100 mg by mouth daily as needed (For constipation.).     [provider]  doxycycline (VIBRA-TABS) 100 MG tablet Take 1 tablet (100 mg total) by mouth 2 (two) times daily for 7 days. 03/10/20 03/17/20  [name], [name], NP  DULoxetine (CYMBALTA) 30 MG capsule TAKE 1 CAPSULE BY MOUTH EVERY DAY 11/20/19   [name], [name], NP  Ferrous Sulfate (IRON PO) Take 325 mg by mouth daily.     [provider]  furosemide (LASIX) 80 MG tablet Take 1 tablet (80 mg total) by mouth 2 (two) times daily. 10/25/19   Rai, Ripudeep  K, MD  magnesium oxide (MAG-OX) 400 MG tablet Take 2 tablets by mouth 2 (two) times daily. 10/18/19   [provider]  mycophenolate (MYFORTIC) 180 MG EC tablet Take 180 mg by mouth 2 (two) times daily.    [provider]  ondansetron (ZOFRAN) 4 MG tablet Take 1 tablet (4 mg total) by mouth every 6 (six) hours as needed for nausea. 11/18/19   Aline August, MD  potassium chloride SA (K-DUR,KLOR-CON) 20 MEQ tablet Take 20 mEq by mouth daily.     [provider]  predniSONE (DELTASONE) 5 MG tablet Take 1 tablet by mouth daily. 06/26/19 06/25/20  [provider]  tacrolimus (PROGRAF) 1 MG capsule Take 2 mg by mouth 2 (two) times daily.     [provider]  VENTOLIN HFA 108 [phone] Base) MCG/ACT inhaler  10/21/19   [provider]    Allergies    Ace inhibitors, Amiodarone, Warfarin, and Warfarin sodium  Review of Systems   Review of Systems  Constitutional: Negative for chills and fever.  Respiratory: Positive for cough and shortness of breath.   Cardiovascular: Positive for leg swelling (chronic unchanged). Negative for chest pain.  Gastrointestinal: Negative for abdominal pain, blood in stool, nausea and vomiting.  Genitourinary: Negative for dysuria.  Neurological: Negative for syncope.  All other systems reviewed and are negative.  Physical Exam Updated Vital Signs BP 139/82 (BP Location: Right Arm)   Pulse 74   Temp 97.8 F (36.6 C) (Oral)   Resp (!) 23   SpO2 96%   Physical Exam Vitals and nursing note reviewed.  Constitutional:      General: She is not in acute distress.    Appearance: She is well-developed. She is not toxic-appearing.  HENT:     Head: Normocephalic and atraumatic.  Eyes:     General:  Right eye: No discharge.        Left eye: No discharge.     Conjunctiva/sclera: Conjunctivae normal.  Cardiovascular:     Rate and Rhythm: Normal rate and regular rhythm.  Pulmonary:     Breath sounds: Wheezing  (minimal expiratory throughout) and rales (bibasilar) present.     Comments: Patient has a wet sounding cough throughout exam.  Her SPO2 is 91 to 92% on room air at rest.  She is mildly tachypneic.  She does not appear in overt respiratory distress. Abdominal:     General: There is no distension.     Palpations: Abdomen is soft.     Tenderness: There is no abdominal tenderness.  Musculoskeletal:     Cervical back: Neck supple.     Comments: Trace lower leg edema without overlying erythema/warmth.  Patient has a right lower leg wound with a bandage in place which will remain at this time.  Skin:    General: Skin is warm and dry.     Findings: No rash.  Neurological:     Mental Status: She is alert.     Comments: Clear speech.   Psychiatric:        Behavior: Behavior normal.     ED Results / Procedures / Treatments   Labs (all labs ordered are listed, but only abnormal results are displayed) Labs Reviewed  BASIC METABOLIC PANEL  CBC  BRAIN NATRIURETIC PEPTIDE    EKG None  Radiology DG Chest 2 View  Result Date: 03/11/2020 CLINICAL DATA:  Shortness of breath. EXAM: CHEST - 2 VIEW COMPARISON:  03/10/2020 FINDINGS: 0946 hours. The cardio pericardial silhouette is enlarged. Interstitial markings are diffusely coarsened with chronic features. There is pulmonary vascular congestion without overt pulmonary edema. Bibasilar airspace disease is stable to minimally progressive. No pleural effusion. Status post left atrial appendage occlusion. Single lead pacer remains in place. Telemetry leads overlie the chest. Lap band noted left upper quadrant and IVC filter overlies the upper lumbar spine. IMPRESSION: Cardiomegaly with hyperinflation and chronic interstitial changes. Patchy airspace disease at the bases, stable to minimally progressed. Electronically Signed   By: Misty Stanley M.D.   On: 03/11/2020 09:59   DG Chest 2 View  Result Date: 03/10/2020 CLINICAL DATA:  Shortness of breath.  EXAM: CHEST - 2 VIEW COMPARISON:  02/27/2020. FINDINGS: Cardiac pacer with lead tip over the right ventricle. Postsurgical changes noted of chest with occlusion device and coils noted over the left chest. Chronic interstitial changes again noted. Active right base infiltrate cannot be excluded. No pleural effusion or pneumothorax. Lap band noted stable position. IMPRESSION: 1. Cardiac pacer in stable position. Stable postsurgical changes noted the chest. Stable cardiomegaly. 2. Chronic interstitial lung disease. Superimposed acute infiltrate right lung base cannot be excluded. Electronically Signed   By: Marcello Moores  Register   On: 03/10/2020 12:24    Procedures .Critical Care Performed by: Amaryllis Dyke, PA-C Authorized by: Amaryllis Dyke, PA-C    CRITICAL CARE Performed by: Kennith Maes   Total critical care time: 40 minutes  Critical care time was exclusive of separately billable procedures and treating other patients.  Critical care was necessary to treat or prevent imminent or life-threatening deterioration.  Critical care was time spent personally by me on the following activities: development of treatment plan with patient and/or surrogate as well as nursing, discussions with consultants, evaluation of patient's response to treatment, examination of patient, obtaining history from patient or surrogate, ordering and performing treatments and interventions,  ordering and review of laboratory studies, ordering and review of radiographic studies, pulse oximetry and re-evaluation of patient's condition.    (including critical care time)  Medications Ordered in ED Medications - No data to display  ED Course  I have reviewed the triage vital signs and the nursing notes.  Pertinent labs & imaging results that were available during my care of the patient were reviewed by me and considered in my medical decision making (see chart for details).  [name] was  evaluated in Emergency Department on 03/11/2020 for the symptoms described in the history of present illness. He/she was evaluated in the context of the global COVID-19 pandemic, which necessitated consideration that the patient might be at risk for infection with the SARS-CoV-2 virus that causes COVID-19. Institutional protocols and algorithms that pertain to the evaluation of patients at risk for COVID-19 are in a state of rapid change based on information released by regulatory bodies including the CDC and federal and state organizations. These policies and algorithms were followed during the patient's care in the ED.    MDM Rules/Calculators/A&P                         Patient presents to the ED with complaints of dyspnea found to be hypoxic at wound care clinic today.  On my assessment patient's SPO2 is borderline on room air at rest, she is mildly tachypnea, does not appear in overt respiratory distress.  BP mildly elevated.  She has mild expiratory wheezing throughout with bibasilar rales and trace symmetric pitting edema.  DDx: CHF exacerbation, pneumonia, asthma exacerbation, pulmonary embolism, atypical ACS, critical anemia, COVID-19. Additional history obtained:  Additional history obtained from wound care clinic note sent to the emergency department with the patient, relays on SPO2 in the 60s. Previous records obtained and reviewed.   EKG: Paced rhythm, no STEMI Lab Tests: I Ordered, reviewed, and interpreted labs, which included:  CBC: No significant anemia or leukocytosis. CMP: Worsening renal function with creatinine 2.81 and BUN 72 which is increased from most recent 2.2 and 23. Hyperkalemia at 6.2. BNP: Elevated Troponin: Mildly elevated, likely demand  With her uptrending renal function as well as her hyperkalemia we will discuss with nephrology.  Imaging Studies ordered:  I ordered imaging studies which included CXR, I independently visualized and interpreted imaging which  showed  1. Cardiac pacer in stable position. Stable postsurgical changes noted the chest. Stable cardiomegaly. 2. Chronic interstitial lung disease. Superimposed acute infiltrate right lung base cannot be excluded.--> compared to CXR yesterday   Concern for community-acquired pneumonia Rocephin and azithromycin were started in the emergency department. She was given some albuterol and Atrovent puffs due to minimal wheezing.   Discussed case with nephrologist Dr. Schertz-recommends nebulized albuterol, 5 units of insulin with dextrose, sodium bicarb, and 10 g packet of Lokelma.  Will see in consultation.  12:24: CONSULT: Discussed with hospitalist Dr. Kittie Plater admission.  COVID test pending @ time of admission, resulted positive following  Portions of this note were generated with Dragon dictation software. Dictation errors may occur despite best attempts at proofreading.  Findings and plan of care discussed with supervising physician Dr. Sherry Ruffing who evaluated patient as shared visit & is in agreement.   Final Clinical Impression(s) / ED Diagnoses Final diagnoses:  Community acquired pneumonia of right lower lobe of lung  Hyperkalemia  Congestive heart failure, unspecified HF chronicity, unspecified heart failure type ([place])    Rx / DC  Orders ED Discharge Orders    None       Leafy Kindle 03/11/20 1424    Tegeler, Gwenyth Allegra, MD 03/11/20 1447

## 2020-03-11 NOTE — Telephone Encounter (Signed)
FYI: Dr. Wyline Copas called from [facility] called and wanted to let [name] know that patient tested positive for Covid today and is going to be admitted at [facility].

## 2020-03-11 NOTE — Consult Note (Addendum)
Renal Service Consult Note [facility]  [name] 03/11/2020 [place] Requesting Physician:  Dr Wyline Copas  Reason for Consult:  Renal transplant patient w/ COVID PNA HPI: The patient is a 74 y.o. year-old w/ hx of atrial fib, renal transplant 2012, PPM presented w/ SOB, congestion, weakness. Low SpO2 at wound clinic prompted ED visit where CXR showing bilat patchy infiltrates, RR 30's , K 6.2 and Cr 2.8.  COVID test was +.  Pt being admitted by [facility] team. Asked to see for renal failure/ ^K+.    Pt seen in room/ ED.  She was on HD from 2014 til her kidney transplant at [facility] in 2017.  Sees Dr Deterding at [facility] and [facility] doctor, not sure the name.  Has been taking her 3 transplant medications, last dose last night.   Main c/o is cough and SOB, malaise, fevers, etc. No leg swelling or abd swelling.  No voiding c/o's.   ROS  denies CP  no joint pain   no HA  no blurry vision  no rash  no diarrhea  no nausea/ vomiting   Past Medical History  Past Medical History:  Diagnosis Date  . Acute diastolic (congestive) heart failure ([place]) 08/12/2017  . AKI (acute kidney injury) ([place]) 11/04/2017  . Anemia   . Anxiety   . Arthritis    "shoulders; arms; hips" (02/04/2018)  . Asthma   . Atrial fibrillation ([place])   . Atrial flutter [facility])    s/p aflutter ablation at [facility]  . Benign hypertension with ESRD (end-stage renal disease) ([place])   . Blood transfusion    never had a reaction to blood transfusion  . Cardiomyopathy Mar 2013   Mild, EF 50-55% by Mar 2013 ECHO, diast dysfxn II  . Cardiomyopathy ([place])   . CHF (congestive heart failure) ([place]) 2005  . CHF (congestive heart failure) ([place])   . Childhood asthma   . Closed fracture of right distal radius 10/18/2016  . CMV (cytomegalovirus infection) ([place]) 10/03/2015  . Constipation    takes Miralax daily  . Constipation   . Depression    takes Zoloft daily  . Distal radius fracture, right   . DVT of lower extremity,  bilateral ([place]) 12/21/11   "they're there now; been there for 2 wks"  . Eczema   . ESRD (end stage renal disease) on dialysis [facility]) 09/2011   07-19-2015 had Kidney transplant at [facility]; "don't get dialysis anymore" (02/04/2018)  . Fractures, stress    in both feet--6 OR 7 YRS AGO--RESOLVED  . Generalized edema [id number]  . Gout    doesn't require meds   . HCAP (healthcare-associated pneumonia) 09/10/2011  . Hearing difficulty   . High cholesterol   . History of CVA (cerebrovascular accident) without residual deficits   . History of hip replacement, total, right   . History of pneumonia   . Hx of Clostridium difficile infection   . Hypercalcemia    09/10/11  . Hyperparathyroidism ([place]) 04/07/2013  . Hypertension    takes Diltiazem daily   . Hypomagnesemia 08/02/2015  . Hypophosphatemia 11/08/2017  . ICB (intracranial bleed) ([place])   . Immunosuppression ([place]) 07/25/2015  . Memory changes   . Morbid obesity ([place])   . Nonischemic dilated cardiomyopathy ([place])   . Obesity 04/07/2013  . Oligouria   . OSA on CPAP   . PAF (paroxysmal atrial fibrillation) (HCC)     HX OF CEREBRAL BLEED WHILE ON COUMADIN-SO PT NOT ON ANY BLOOD THINNERS NOW  .  Peripheral vascular disease ([place])   . Presence of Watchman left atrial appendage closure device 10/04/2017  . Proteinuria 09/04/2016  . Psoriasis 08/07/2016  . 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 S/P insertion of IVC (inferior vena caval) filter   . Sepsis ([place]) 11/08/2017  . Stress fracture    bilateral feet  . Stroke [facility]) 2009   denies residual on 02/04/2018;  hemorrhagic now off coumadin  . Stroke due to intracerebral hemorrhage ([place]) 2009  . Suture reaction 09/04/2016  . Transfusion history   . Use of cane as ambulatory aid    Past Surgical History  Past Surgical History:  Procedure Laterality Date  . AV FISTULA PLACEMENT  09/28/2011   Procedure: ARTERIOVENOUS (AV) FISTULA CREATION;  Surgeon: Rosetta Posner, MD;  Location: [place];   Service: Vascular;  Laterality: Left;  . CARDIAC CATHETERIZATION    . CARDIAC VALVE SURGERY  1960    FOR ATRIAL SEPTAL DEFECT  . CHOLECYSTECTOMY     1980's  . COLONOSCOPY    . COLONOSCOPY WITH PROPOFOL N/A 11/16/2019   Procedure: COLONOSCOPY WITH PROPOFOL;  Surgeon: Juanita Craver, MD;  Location: [facility] ENDOSCOPY;  Service: Endoscopy;  Laterality: N/A;  . CYSTOSCOPY     many yrs ago  . ESOPHAGOGASTRODUODENOSCOPY (EGD) WITH PROPOFOL N/A 11/16/2019   Procedure: ESOPHAGOGASTRODUODENOSCOPY (EGD) WITH PROPOFOL;  Surgeon: Juanita Craver, MD;  Location: [facility] ENDOSCOPY;  Service: Endoscopy;  Laterality: N/A;  . FEMUR FRACTURE SURGERY Right 03/2011; 09/03/2011   "had 2, 2 wk apart in 2012; broke it again 08/2011 & had OR"  . FRACTURE SURGERY    . I & D EXTREMITY  09/15/2011   Procedure: IRRIGATION AND DEBRIDEMENT EXTREMITY w REMOVAL OF HARDWARE;  Surgeon: [name], MD;  Location: [place];  Service: Orthopedics;  Laterality: Right;  . INCISION AND DRAINAGE HIP  12/21/2011   Procedure: IRRIGATION AND DEBRIDEMENT HIP;  Surgeon: [name], MD;  Location: [place];  Service: Orthopedics;  Laterality: Right;  I&D RIGHT HIP WITH PLACEMENT ANTIBIOTIC SPACER  . INSERTION OF DIALYSIS CATHETER     Procedure: INSERTION OF DIALYSIS CATHETER;  Surgeon: Rosetta Posner, MD;  Location: [place];  Service: Vascular;  Laterality: Right;  . IRRIGATION AND DEBRIDEMENT ABSCESS  12/21/11   right hip  . JOINT REPLACEMENT    . KIDNEY TRANSPLANT  07/19/15  . LAPAROSCOPIC GASTRIC BANDING  2008  . LEFT ATRIAL APPENDAGE OCCLUSION  10/04/2017  . OPEN REDUCTION INTERNAL FIXATION (ORIF) DISTAL RADIAL FRACTURE Right 10/18/2016   Procedure: OPEN REDUCTION INTERNAL FIXATION (ORIF) RIGHT DISTAL RADIAL FRACTURE;  Surgeon: Marchia Bond, MD;  Location: [place];  Service: Orthopedics;  Laterality: Right;  . TOTAL HIP ARTHROPLASTY Right 02/2011   right THA 02/2011, I&D/removal of hardware 09/2011,, repeat I&D Jun 2013, reimplantation R THA  03-26-2012  . TOTAL HIP REVISION  03/25/2012   Procedure: TOTAL HIP REVISION;  Surgeon: [name], MD;  Location: [facility];  Service: Orthopedics;  Laterality: Right;  Right Total Hip Reimplantation  . TUBAL LIGATION    . VENA CAVA FILTER PLACEMENT  11/2011   Family History  Family History  Problem Relation Age of Onset  . Cancer Father   . Diabetes Mother   . Hypertension Mother   . Arthritis Mother   . Hodgkin's lymphoma Other 32       dscd---HODGKINS DISEASE  . Hypertension Brother    Social History  reports that she has never smoked. She has never used smokeless  tobacco. She reports that she does not drink alcohol and does not use drugs. Allergies  Allergies  Allergen Reactions  . Ace Inhibitors Other (See Comments)    Reaction unknown  . 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 Warfarin Other (See Comments)    Left side brain hemorrhage  . Warfarin Sodium Other (See Comments)    Caused her to have a stroke   Home medications Prior to Admission medications   Medication Sig Start Date End Date Taking? Authorizing Provider  acetaminophen (TYLENOL) 500 MG tablet Take 500 mg by mouth every 6 (six) hours as needed for mild pain or headache.     [provider]  albuterol (PROVENTIL) (2.5 MG/3ML) 0.083% nebulizer solution Take 3 mLs (2.5 mg total) by nebulization every 6 (six) hours as needed for wheezing or shortness of breath. Dx:J45.909 10/25/19   Rai, Delene Ruffini, MD  budesonide-formoterol (SYMBICORT) 160-4.5 MCG/ACT inhaler Inhale 2 puffs into the lungs 2 (two) times daily. Patient not taking: Reported on 03/10/2020 12/02/19   Oretha Milch, MD  calcitRIOL (ROCALTROL) 0.25 MCG capsule Take 0.25 mcg by mouth every other day. 12/13/19   [provider]  carvedilol (COREG) 12.5 MG tablet Take 2 tablets (25 mg total) by mouth 2 (two) times daily. 11/18/19   Glade Lloyd, MD  docusate sodium (COLACE)  100 MG capsule Take 100 mg by mouth daily as needed (For constipation.).     [provider]  doxycycline (VIBRA-TABS) 100 MG tablet Take 1 tablet (100 mg total) by mouth 2 (two) times daily for 7 days. 03/10/20 03/17/20  [name], Bonna Gains, NP  DULoxetine (CYMBALTA) 30 MG capsule TAKE 1 CAPSULE BY MOUTH EVERY DAY 11/20/19   [name], Bonna Gains, NP  Ferrous Sulfate (IRON PO) Take 325 mg by mouth daily.     [provider]  furosemide (LASIX) 80 MG tablet Take 1 tablet (80 mg total) by mouth 2 (two) times daily. 10/25/19   Rai, Ripudeep K, MD  magnesium oxide (MAG-OX) 400 MG tablet Take 2 tablets by mouth 2 (two) times daily. 10/18/19   [provider]  mycophenolate (MYFORTIC) 180 MG EC tablet Take 180 mg by mouth 2 (two) times daily.    [provider]  ondansetron (ZOFRAN) 4 MG tablet Take 1 tablet (4 mg total) by mouth every 6 (six) hours as needed for nausea. 11/18/19   Glade Lloyd, MD  potassium chloride SA (K-DUR,KLOR-CON) 20 MEQ tablet Take 20 mEq by mouth daily.     [provider]  predniSONE (DELTASONE) 5 MG tablet Take 1 tablet by mouth daily. 06/26/19 06/25/20  [provider]  tacrolimus (PROGRAF) 1 MG capsule Take 2 mg by mouth 2 (two) times daily.     [provider]  VENTOLIN HFA 108 [phone] Base) MCG/ACT inhaler  10/21/19   [provider]     Vitals:   03/11/20 1235 03/11/20 1300 03/11/20 1405 03/11/20 1500  BP:  129/81 114/77 125/78  Pulse:  73 71   Resp:  (!) 31 (!) 30 (!) 27  Temp:   98.2 F (36.8 C)   TempSrc:   Oral   SpO2:  92% 100%   Weight: 64.4 kg     Height: 5\' 5"  (1.651 m)      Exam Gen alert, uncomfortable AAF, some ^wob, occ cough No rash, cyanosis or gangrene Sclera anicteric, throat clear  No jvd or bruits  Chest bilat scattered coarse rales/ rhonchi, no wheezing RRR no MRG Abd soft ntnd no mass or ascites +bs GU defer MS no joint effusions or deformity Ext no LE or UE edema, no wounds or  ulcers Neuro is awake, tremulous a bit, nonfocal, no asterixis  LUA AVF +bruit    Home meds:  - lasix 80 bid/ kdur 20 qd/ coreg 12.5 bid   - prograf 2 mg bid/ myfortic 180 bid/ pred 5 qd  - cymbalta 30/ symbicort 2 bid  - prn's/ vitamins/ supplements    CXR 8/27 - IMPRESSION:  Cardiomegaly with hyperinflation and chronic interstitial changes.  Patchy airspace disease at the bases, stable to minimally progressed.     Na 137  K 6.2  CO2 29  BUN 72  Cr 2.81  Ca 9.3  Alb 3.6  LFT's wnl'   Hb 12  WBC 8k  plt 217   COVID +   B/l creat from April - May 2021 = 2.1- 2.3, eGFR 24- 28 ml/min    Assessment/ Plan: 1. AoCKD 4 of renal transplant - cont IS w/  myfortic and prograf , pred on hold due to IV steroids for COVID.  Creat up some from baseline. Supportive care. Get office records. Get [name] and UA, urine lytes. Consider IVF's if creat continues to go up.  2. BP/volume - BP's are normal. Resp problems may all be COVID related, don't see vol overload on exam. No lasix for now. Supportive care.  3. COVID PNA/ hypoxemia - sig CXR changes, per primary team getting remdesivir and IV steroids.  4. Hyperkalemia - getting acute protocol meds in ED.  Cont lokelma 10 bid for now and use renal diet.  5. H/o CVA 6. H/o DVT 7. Atrial fib - not on anticoag, on BB coreg      Rob Hudson Lehmkuhl  MD 03/11/2020, 3:55 PM  Recent Labs  Lab 03/11/20 1029  WBC 8.3  HGB 12.3   Recent Labs  Lab 03/11/20 1030  K 6.2*  BUN 72*  CREATININE 2.81*  CALCIUM 9.3

## 2020-03-12 LAB — RENAL FUNCTION PANEL
Albumin: 3 g/dL — ABNORMAL LOW (ref 3.5–5.0)
Anion gap: 12 (ref 5–15)
BUN: 65 mg/dL — ABNORMAL HIGH (ref 8–23)
CO2: 28 mmol/L (ref 22–32)
Calcium: 9 mg/dL (ref 8.9–10.3)
Chloride: 100 mmol/L (ref 98–111)
Creatinine, Ser: 2.35 mg/dL — ABNORMAL HIGH (ref 0.44–1.00)
GFR calc Af Amer: 23 mL/min — ABNORMAL LOW (ref >60)
GFR calc non Af Amer: 20 mL/min — ABNORMAL LOW (ref >60)
Glucose, Bld: 116 mg/dL — ABNORMAL HIGH (ref 70–99)
Phosphorus: 4.5 mg/dL (ref 2.5–4.6)
Potassium: 5.4 mmol/L — ABNORMAL HIGH (ref 3.5–5.1)
Sodium: 140 mmol/L (ref 135–145)

## 2020-03-12 LAB — CBC
HCT: 42.5 % (ref 36.0–46.0)
Hemoglobin: 13.1 g/dL (ref 12.0–15.0)
MCH: 25.3 pg — ABNORMAL LOW (ref 26.0–34.0)
MCHC: 30.8 g/dL (ref 30.0–36.0)
MCV: 82.2 fL (ref 80.0–100.0)
Platelets: 234 10*3/uL (ref 150–400)
RBC: 5.17 MIL/uL — ABNORMAL HIGH (ref 3.87–5.11)
RDW: 16.1 % — ABNORMAL HIGH (ref 11.5–15.5)
WBC: 3.6 10*3/uL — ABNORMAL LOW (ref 4.0–10.5)
nRBC: 0 % (ref 0.0–0.2)

## 2020-03-12 LAB — FERRITIN: Ferritin: 391 ng/mL — ABNORMAL HIGH (ref 11–307)

## 2020-03-12 LAB — D-DIMER, QUANTITATIVE: D-Dimer, Quant: 1.35 ug/mL-FEU — ABNORMAL HIGH (ref 0.00–0.50)

## 2020-03-12 LAB — C-REACTIVE PROTEIN: CRP: 15.5 mg/dL — ABNORMAL HIGH (ref <1.0)

## 2020-03-12 MED ORDER — SODIUM CHLORIDE 0.45 % IV SOLN: INTRAVENOUS | Status: AC

## 2020-03-12 NOTE — Progress Notes (Signed)
PROGRESS NOTE    [name]  OVF:[id number] DOB: [birth year] DOA: 03/11/2020 PCP: Jane Buffy, NP   Brief Narrative:  [name] is a 74 y.o. female with medical history significant of end-stage renal disease status post renal transplant 2017 currently on chronic immunosuppression, atrial fibrillation, pacemaker placement, CKD who initially presented to her primary care provider office on 03/10/2020 with complaints of 2-week history of increased congestion, weakness and shortness of breath.  Patient was noted to be mildly hypoxemic with O2 sat of 94% on room air in the office.  Patient was thought to have acute heart failure and was thus referred to CHF clinic.  On the day of hospital visit, patient went to wound care appointment regarding a wound on her right leg and was there found to be hypoxemic with O2 saturation into the 60s on ambulation.  Patient was therefore referred to the emergency department for further work-up. Of note, pt's husband has been having URI symptoms as well. In the emergency department, patient was noted to have chest x-ray findings suggestive of bilateral infiltrates.  Mildly tachypneic with respiratory rate in excess of 30 breaths/min with O2 saturation in the low 90s while at rest on room air.  She was also noted to have a potassium 6.2 with a creatinine of 2.8. Nephrology was consulted regarding elevated potassium and patient did later received IV insulin with IV dextrose with IV bicarb.  Covid test later returned positive for COVID-19.  Hospitalist service consulted for consideration for medical admission   Assessment & Plan:   Principal Problem:   Pneumonia due to COVID-19 virus Active Problems:   Essential hypertension   CVA (cerebrovascular accident due to intracerebral hemorrhage) ([place])   End stage renal disease ([place])   Renal transplant recipient   Chronic respiratory failure with hypoxia (HCC)   Hyperkalemia   Personal history of PE (pulmonary  embolism)   Immunosuppression ([place])   Cardiac pacemaker in situ   SOB (shortness of breath)    Acute hypoxic respiratory failure in the setting of COVID-19 pneumonia, POA -Patient is vaccinated in early February and March 2021; likely the reason patient is having minimal symptoms at this time -Chest x-ray shows scant bilateral infiltrates  -Patient has mild hypoxia and respiratory symptoms today, drastically improving with steroids and Remdesivir -late this afternoon she has been weaned off of oxygen onto room air; will continue to ambulate to verify if ongoing hypoxia or dyspnea with exertion -Continue supportive care, incentive spirometry, flutter, bronchodilators, early ambulation, out of bed as tolerated, proning. -Continue to follow inflammatory markers Recent Labs    03/11/20 1642 03/12/20 0424  DDIMER 1.28* 1.35*  FERRITIN 360* 391*  CRP 13.1* 15.5*    History of ESRD status post renal transplant -Nephrology consulted, appreciate insight and recommendations  -Status post renal transplant [name] 2017, continue immunosuppression medications including mycophenolate, tacrolimus -Creatinine appears to be within normal limits, advance diet as tolerated, renal diet per nephrology  Hypertension, essential -Continue home carvedilol  History of CVA Seems to be stable currently  Hyperkalemia, mild, ongoing  -Defer to nephrology, continue low potassium diet  History of PE/DVT Status post IVC filter, currently not on anticoagulation due to history of intracranial hemorrhage 2009  History of pacemaker placement Seems to be stable at present, currently paced on EKG  DVT prophylaxis: lovenox subq  Code Status: Full Family Communication: Patient to update family  Status is: Inpatient  Dispo: The patient is from: Home  Anticipated d/c is to: Home              Anticipated d/c date is: 24 to 48 hours              Patient currently not medically stable for  discharge  Consultants:   Nephrology  Procedures:   None planned  Antimicrobials:  Remdesivir  Subjective: No acute issues or events overnight, patient feels remarkably improved from admission, denies nausea, vomiting, diarrhea, constipation, headache, fevers, chills.  She indicates her respiratory status seems markedly improving, excited to ambulate today to evaluate need for possible oxygen supplementation at discharge.  Objective: Vitals:   03/11/20 2353 03/12/20 0350 03/12/20 0500 03/12/20 0804  BP: (!) 149/87 (!) 143/89  (!) 152/94  Pulse: 70 70  70  Resp: 18 18    Temp: 98.1 F (36.7 C) 97.9 F (36.6 C)  97.7 F (36.5 C)  TempSrc:  Oral  Oral  SpO2: 96% 100%  98%  Weight:   64.7 kg   Height:        Intake/Output Summary (Last 24 hours) at 03/12/2020 0844 Last data filed at 03/12/2020 0500 Gross per 24 hour  Intake 200 ml  Output 250 ml  Net -50 ml   Filed Weights   03/11/20 1235 03/12/20 0500  Weight: 64.4 kg 64.7 kg    Examination: General:  Pleasantly resting in bed, No acute distress. HEENT:  Normocephalic atraumatic.  Sclerae nonicteric, noninjected.  Extraocular movements intact bilaterally. Neck:  Without mass or deformity.  Trachea is midline. Lungs:  Clear to auscultate bilaterally without rhonchi, wheeze, or rales. Heart:  Regular rate and rhythm.  Without murmurs, rubs, or gallops. Abdomen:  Soft, nontender, nondistended.  Without guarding or rebound. Extremities: Without cyanosis, clubbing, edema, or obvious deformity. Vascular:  Dorsalis pedis and posterior tibial pulses palpable bilaterally. Skin:  Warm and dry, no erythema, no ulcerations.  Data Reviewed: I have personally reviewed following labs and imaging studies  CBC: Recent Labs  Lab 03/11/20 1029 03/11/20 1642  WBC 8.3 6.6  HGB 12.3 11.7*  HCT 40.2 38.8  MCV 82.7 83.6  PLT 217 811   Basic Metabolic Panel: Recent Labs  Lab 03/11/20 1030 03/11/20 1639 03/11/20 1642  NA  137  --   --   K 6.2*  --   --   CL 97*  --   --   CO2 29  --   --   GLUCOSE 140* 149*  --   BUN 72*  --   --   CREATININE 2.81*  --  2.78*  CALCIUM 9.3  --   --    GFR: Estimated Creatinine Clearance: 16.7 mL/min (A) (by C-G formula based on SCr of 2.78 mg/dL (H)). Liver Function Tests: Recent Labs  Lab 03/11/20 1030  AST 36  ALT 23  ALKPHOS 96  BILITOT 0.9  PROT 6.9  ALBUMIN 3.6   No results for input(s): LIPASE, AMYLASE in the last 168 hours. No results for input(s): AMMONIA in the last 168 hours. Coagulation Profile: No results for input(s): INR, PROTIME in the last 168 hours. Cardiac Enzymes: No results for input(s): CKTOTAL, CKMB, CKMBINDEX, TROPONINI in the last 168 hours. BNP (last 3 results) No results for input(s): PROBNP in the last 8760 hours. HbA1C: No results for input(s): HGBA1C in the last 72 hours. CBG: Recent Labs  Lab 03/11/20 1625  GLUCAP 138*   Lipid Profile: No results for input(s): CHOL, HDL, LDLCALC, TRIG, CHOLHDL, LDLDIRECT in the last  72 hours. Thyroid Function Tests: No results for input(s): TSH, T4TOTAL, FREET4, T3FREE, THYROIDAB in the last 72 hours. Anemia Panel: Recent Labs    03/11/20 1642 03/12/20 0424  FERRITIN 360* 391*   Sepsis Labs: No results for input(s): PROCALCITON, LATICACIDVEN in the last 168 hours.  Recent Results (from the past 240 hour(s))  SARS Coronavirus 2 by RT PCR (hospital order, performed in [facility] hospital lab) Nasopharyngeal Nasopharyngeal Swab     Status: Abnormal   Collection Time: 03/11/20 10:44 AM   Specimen: Nasopharyngeal Swab  Result Value Ref Range Status   SARS Coronavirus 2 POSITIVE (A) NEGATIVE Final    Comment: CRITICAL RESULT CALLED TO, READ BACK BY AND VERIFIED WITH: BALDWIN,L. RN @1343  03/11/20 BILLINGSLEY,L (NOTE) SARS-CoV-2 target nucleic acids are DETECTED  SARS-CoV-2 RNA is generally detectable in upper respiratory specimens  during the acute phase of infection.  Positive  results are indicative  of the presence of the identified virus, but do not rule out bacterial infection or co-infection with other pathogens not detected by the test.  Clinical correlation with patient history and  other diagnostic information is necessary to determine patient infection status.  The expected result is negative.  Fact Sheet for Patients:   StrictlyIdeas.no   Fact Sheet for Healthcare Providers:   BankingDealers.co.za    This test is not yet approved or cleared by the [place] FDA and  has been authorized for detection and/or diagnosis of SARS-CoV-2 by FDA under an Emergency Use Authorization (EUA).  This EUA will remain in effect  (meaning this test can be used) for the duration of  the COVID-19 declaration under Section 564(b)(1) of the Act, 21 U.S.C. section 360-bbb-3(b)(1), unless the authorization is terminated or revoked sooner.  Performed at [facility], [place] [address] [place], [place] [address]          Radiology Studies: DG Chest 2 View  Result Date: 03/11/2020 CLINICAL DATA:  Shortness of breath. EXAM: CHEST - 2 VIEW COMPARISON:  03/10/2020 FINDINGS: 0946 hours. The cardio pericardial silhouette is enlarged. Interstitial markings are diffusely coarsened with chronic features. There is pulmonary vascular congestion without overt pulmonary edema. Bibasilar airspace disease is stable to minimally progressive. No pleural effusion. Status post left atrial appendage occlusion. Single lead pacer remains in place. Telemetry leads overlie the chest. Lap band noted left upper quadrant and IVC filter overlies the upper lumbar spine. IMPRESSION: Cardiomegaly with hyperinflation and chronic interstitial changes. Patchy airspace disease at the bases, stable to minimally progressed. Electronically Signed   By: Misty Stanley M.D.   On: 03/11/2020 09:59   DG Chest 2 View  Result Date:  03/10/2020 CLINICAL DATA:  Shortness of breath. EXAM: CHEST - 2 VIEW COMPARISON:  02/27/2020. FINDINGS: Cardiac pacer with lead tip over the right ventricle. Postsurgical changes noted of chest with occlusion device and coils noted over the left chest. Chronic interstitial changes again noted. Active right base infiltrate cannot be excluded. No pleural effusion or pneumothorax. Lap band noted stable position. IMPRESSION: 1. Cardiac pacer in stable position. Stable postsurgical changes noted the chest. Stable cardiomegaly. 2. Chronic interstitial lung disease. Superimposed acute infiltrate right lung base cannot be excluded. Electronically Signed   By: Marcello Moores  Register   On: 03/10/2020 12:24   US Renal Transplant w/Doppler  Result Date: 03/11/2020 CLINICAL DATA:  Acute renal failure EXAM: ULTRASOUND OF RENAL TRANSPLANT WITH RENAL DOPPLER ULTRASOUND TECHNIQUE: Ultrasound examination of the renal transplant was performed with gray-scale, color and duplex doppler  evaluation. COMPARISON:  February 03, 2018 FINDINGS: Transplant kidney location: RLQ Transplant Kidney: Renal measurements: 11.9 cm x 4.6 cm x 4.7 cm = volume: 128.36mL. Normal in size and parenchymal echogenicity. No evidence of mass or hydronephrosis. No peri-transplant fluid collection seen. Color flow in the main renal artery:  Yes Color flow in the main renal vein:  Yes Duplex Doppler Evaluation: Main Renal Artery Velocity: 22 cm/sec Main Renal Artery Resistive Index: N/A Venous waveform in main renal vein:  present Intrarenal resistive index in upper pole:  0.51 (normal 0.6-0.8; equivocal 0.8-0.9; abnormal >= 0.9) Intrarenal resistive index in lower pole: 1.0 (normal 0.6-0.8; equivocal 0.8-0.9; abnormal >= 0.9) Bladder: Normal for degree of bladder distention. Other findings:  None. IMPRESSION: 1. Normal appearance of the right lower quadrant renal transplant without evidence of hydronephrosis. 2. Elevated lower pole intrarenal resistive index.  Electronically Signed   By: Virgina Norfolk M.D.   On: 03/11/2020 18:50        Scheduled Meds: . albuterol  10 mg Nebulization Once  . vitamin C  500 mg Oral Daily  . carvedilol  12.5 mg Oral BID  . dexamethasone (DECADRON) injection  6 mg Intravenous Q24H  . DULoxetine  30 mg Oral Daily  . enoxaparin (LOVENOX) injection  30 mg Subcutaneous Q24H  . mycophenolate  180 mg Oral BID  . tacrolimus  2 mg Oral BID  . zinc sulfate  220 mg Oral Daily   Continuous Infusions: . remdesivir 100 mg in NS 100 mL       LOS: 1 day   Time spent: 61min  Carnelia Oscar C Adi Seales, DO [facility]  If 7PM-7AM, please contact night-coverage [email or web address]  03/12/2020, 8:44 AM

## 2020-03-12 NOTE — Progress Notes (Signed)
SARAJANE, FAMBROUGH T ([id number]) . Visit Report for 03/04/2020 Arrival Information Details Patient Name: Date of Service: [name] NDA T. 03/04/2020 9:15 A M Medical Record Number: [id number] Patient Account Number: [id number] Date of Birth/Sex: Treating RN: 03-Sep-1945 (74 y.o. Orvan Falconer Primary Care Yasenia Reedy: Wilfred Lacy Other Clinician: Referring Jeffery Bachmeier: Treating Nallely Yost/Extender: Alvira Monday in Treatment: 3 Visit Information History Since Last Visit All ordered tests and consults were completed: No Patient Arrived: Ambulatory Added or deleted any medications: No Arrival Time: 10:04 Any new allergies or adverse reactions: No Accompanied By: self Had a fall or experienced change in No Transfer Assistance: None activities of daily living that may affect Patient Identification Verified: Yes risk of falls: Secondary Verification Process Completed: Yes Signs or symptoms of abuse/neglect since last visito No Patient Has Alerts: Yes Hospitalized since last visit: No Patient Alerts: Patient on Blood Thinner Implantable device outside of the clinic excluding No takes aspirin cellular tissue based products placed in the center since last visit: Has Dressing in Place as Prescribed: Yes Has Compression in Place as Prescribed: Yes Pain Present Now: No Electronic Signature(s) Signed: 03/11/2020 5:50:16 PM By: Carlene Coria RN Entered By: Carlene Coria on 03/04/2020 10:04:41 -------------------------------------------------------------------------------- Compression Therapy Details Patient Name: Date of Service: [name], [name] NDA T. 03/04/2020 9:15 A M Medical Record Number: [id number] Patient Account Number: [id number] Date of Birth/Sex: Treating RN: May 15, 1946 (74 y.o. Clearnce Sorrel Primary Care Orva Gwaltney: Wilfred Lacy Other Clinician: Referring Lear Carstens: Treating Jya Hughston/Extender: Alvira Monday in Treatment:  3 Compression Therapy Performed for Wound Assessment: Wound #2 Right,Medial Lower Leg Performed By: Clinician Baruch Gouty, RN Compression Type: Three Layer Post Procedure Diagnosis Same as Pre-procedure Electronic Signature(s) Signed: 03/04/2020 4:56:28 PM By: Kela Millin Entered By: Kela Millin on 03/04/2020 10:59:20 -------------------------------------------------------------------------------- Encounter Discharge Information Details Patient Name: Date of Service: [name], [name] NDA T. 03/04/2020 9:15 A M Medical Record Number: [id number] Patient Account Number: [id number] Date of Birth/Sex: Treating RN: 06/29/1946 (74 y.o. Elam Dutch Primary Care Delita Chiquito: Wilfred Lacy Other Clinician: Referring Jacarius Handel: Treating Cameryn Chrisley/Extender: Alvira Monday in Treatment: 3 Encounter Discharge Information Items Discharge Condition: Stable Ambulatory Status: Cane Discharge Destination: Home Transportation: Private Auto Accompanied By: spouse Schedule Follow-up Appointment: Yes Clinical Summary of Care: Patient Declined Electronic Signature(s) Signed: 03/04/2020 5:22:56 PM By: Baruch Gouty RN, BSN Entered By: Baruch Gouty on 03/04/2020 11:16:58 -------------------------------------------------------------------------------- Lower Extremity Assessment Details Patient Name: Date of Service: [name] NDA T. 03/04/2020 9:15 A M Medical Record Number: [id number] Patient Account Number: [id number] Date of Birth/Sex: Treating RN: 20-Nov-1945 (74 y.o. Orvan Falconer Primary Care Kaikoa Magro: Wilfred Lacy Other Clinician: Referring Sven Pinheiro: Treating Burr Soffer/Extender: Alvira Monday in Treatment: 3 Edema Assessment Assessed: [Left: No] [Right: No] Edema: [Left: Ye] [Right: s] Calf Left: Right: Point of Measurement: 37 cm From Medial Instep cm 29 cm Ankle Left: Right: Point of Measurement: 17 cm From Medial  Instep cm 20 cm Electronic Signature(s) Signed: 03/11/2020 5:50:16 PM By: Carlene Coria RN Entered By: Carlene Coria on 03/04/2020 10:07:44 -------------------------------------------------------------------------------- Multi Wound Chart Details Patient Name: Date of Service: [name], [name] NDA T. 03/04/2020 9:15 A M Medical Record Number: [id number] Patient Account Number: [id number] Date of Birth/Sex: Treating RN: 1946/02/24 (74 y.o. Clearnce Sorrel Primary Care Jacquilyn Seldon: Wilfred Lacy Other Clinician: Referring Gustavia Carie: Treating Gwyneth Fernandez/Extender: Alvira Monday in Treatment: 3 Vital Signs Height(in): 65 Pulse(bpm): 87 Weight(lbs): 142 Blood Pressure(mmHg): 120/66 Body Mass Index(BMI):  24 Temperature(F): 98.4 Respiratory Rate(breaths/min): 18 Photos: [2:No Photos Right, Medial Lower Leg] [N/A:N/A N/A] Wound Location: [2:Trauma] [N/A:N/A] Wounding Event: [2:Trauma, Other] [N/A:N/A] Primary Etiology: [2:Cataracts, Anemia, Asthma,] [N/A:N/A] Comorbid History: [2:Arrhythmia, Congestive Heart Failure, Hypertension, End Stage Renal Disease, Gout 12/15/2019] [N/A:N/A] Date Acquired: [2:3] [N/A:N/A] Weeks of Treatment: [2:Open] [N/A:N/A] Wound Status: [2:1.3x1.1x0.2] [N/A:N/A] Measurements L x W x D (cm) [2:1.123] [N/A:N/A] A (cm) : rea [2:0.225] [N/A:N/A] Volume (cm) : [2:-8.30%] [N/A:N/A] % Reduction in Area: [2:87.20%] [N/A:N/A] % Reduction in Volume: [2:Full Thickness With Exposed Support N/A] Classification: [2:Structures Medium] [N/A:N/A] Exudate Amount: [2:Serosanguineous] [N/A:N/A] Exudate Type: [2:red, brown] [N/A:N/A] Exudate Color: [2:Well defined, not attached] [N/A:N/A] Wound Margin: [2:Large (67-100%)] [N/A:N/A] Granulation Amount: [2:Pink] [N/A:N/A] Granulation Quality: [2:Small (1-33%)] [N/A:N/A] Necrotic Amount: [2:Fat Layer (Subcutaneous Tissue): Yes N/A] Exposed Structures: [2:Muscle: Yes Fascia: No Tendon: No Joint: No Bone:  No None] [N/A:N/A] Epithelialization: [2:Compression Therapy] [N/A:N/A] Treatment Notes Wound #2 (Right, Medial Lower Leg) 2. Periwound Care Moisturizing lotion 3. Primary Dressing Applied Collegen AG 4. Secondary Dressing Dry Gauze 6. Support Layer Applied 3 layer compression wrap Electronic Signature(s) Signed: 03/04/2020 4:56:28 PM By: Kela Millin Signed: 03/07/2020 7:15:26 AM By: Linton Ham MD Entered By: Linton Ham on 03/04/2020 11:21:20 -------------------------------------------------------------------------------- [place] Details Patient Name: Date of Service: [name], [place] NDA T. 03/04/2020 9:15 A M Medical Record Number: [id number] Patient Account Number: [id number] Date of Birth/Sex: Treating RN: Jul 15, 1946 (74 y.o. Clearnce Sorrel Primary Care Phyllicia Dudek: Wilfred Lacy Other Clinician: Referring Kellyn Mccary: Treating Jais Demir/Extender: Alvira Monday in Treatment: 3 Active Inactive Abuse / Safety / Falls / Self Care Management Nursing Diagnoses: Potential for falls Potential for injury related to falls Goals: Patient will remain injury free related to falls Date Initiated: 02/08/2020 Target Resolution Date: 03/11/2020 Goal Status: Active Patient/caregiver will verbalize/demonstrate measures taken to prevent injury and/or falls Date Initiated: 02/08/2020 Target Resolution Date: 03/11/2020 Goal Status: Active Interventions: Assess Activities of Daily Living upon admission and as needed Assess fall risk on admission and as needed Assess: immobility, friction, shearing, incontinence upon admission and as needed Assess impairment of mobility on admission and as needed per policy Assess personal safety and home safety (as indicated) on admission and as needed Assess self care needs on admission and as needed Provide education on fall prevention Provide education on personal and home  safety Notes: Wound/Skin Impairment Nursing Diagnoses: Impaired tissue integrity Knowledge deficit related to ulceration/compromised skin integrity Goals: Patient/caregiver will verbalize understanding of skin care regimen Date Initiated: 02/08/2020 Target Resolution Date: 03/11/2020 Goal Status: Active Ulcer/skin breakdown will have a volume reduction of 30% by week 4 Date Initiated: 02/08/2020 Target Resolution Date: 03/11/2020 Goal Status: Active Interventions: Assess patient/caregiver ability to obtain necessary supplies Assess patient/caregiver ability to perform ulcer/skin care regimen upon admission and as needed Assess ulceration(s) every visit Provide education on ulcer and skin care Notes: Electronic Signature(s) Signed: 03/04/2020 4:56:28 PM By: Kela Millin Entered By: Kela Millin on 03/04/2020 10:55:15 -------------------------------------------------------------------------------- Pain Assessment Details Patient Name: Date of Service: [name], [name] NDA T. 03/04/2020 9:15 A M Medical Record Number: [id number] Patient Account Number: [id number] Date of Birth/Sex: Treating RN: 1946/02/04 (74 y.o. Orvan Falconer Primary Care Kamisha Ell: Wilfred Lacy Other Clinician: Referring Brookelle Pellicane: Treating Imad Shostak/Extender: Alvira Monday in Treatment: 3 Active Problems Location of Pain Severity and Description of Pain Patient Has Paino No Site Locations Pain Management and Medication Current Pain Management: Electronic Signature(s) Signed: 03/11/2020 5:50:16 PM By: Carlene Coria RN Entered By: Dolores Lory  Carrie on 03/04/2020 10:06:32 -------------------------------------------------------------------------------- Patient/Caregiver Education Details Patient Name: Date of Service: [name] NDA T. 8/20/2021andnbsp9:15 A M Medical Record Number: [id number] Patient Account Number: [id number] Date of Birth/Gender: Treating RN: April 10, 1946 (74 y.o.  Clearnce Sorrel Primary Care Physician: Wilfred Lacy Other Clinician: Referring Physician: Treating Physician/Extender: Alvira Monday in Treatment: 3 Education Assessment Education Provided To: Patient Education Topics Provided Safety: Methods: Explain/Verbal Responses: State content correctly Wound/Skin Impairment: Handouts: Caring for Your Ulcer Methods: Explain/Verbal Responses: State content correctly Electronic Signature(s) Signed: 03/04/2020 4:56:28 PM By: Kela Millin Entered By: Kela Millin on 03/04/2020 10:55:49 -------------------------------------------------------------------------------- Wound Assessment Details Patient Name: Date of Service: [name], [name] NDA T. 03/04/2020 9:15 A M Medical Record Number: [id number] Patient Account Number: [id number] Date of Birth/Sex: Treating RN: 11/14/1945 (74 y.o. Orvan Falconer Primary Care Areen Trautner: Wilfred Lacy Other Clinician: Referring Jilleen Essner: Treating Trinty Marken/Extender: Alvira Monday in Treatment: 3 Wound Status Wound Number: 2 Primary Trauma, Other Etiology: Wound Location: Right, Medial Lower Leg Wound Open Wounding Event: Trauma Status: Date Acquired: 12/15/2019 Comorbid Cataracts, Anemia, Asthma, Arrhythmia, Congestive Heart Failure, Weeks Of Treatment: 3 History: Hypertension, End Stage Renal Disease, Gout Clustered Wound: No Photos Photo Uploaded By: Mikeal Hawthorne on 03/07/2020 14:30:09 Wound Measurements Length: (cm) 1.3 Width: (cm) 1.1 Depth: (cm) 0.2 Area: (cm) 1.123 Volume: (cm) 0.225 % Reduction in Area: -8.3% % Reduction in Volume: 87.2% Epithelialization: None Tunneling: No Undermining: No Wound Description Classification: Full Thickness With Exposed Support Structures Wound Margin: Well defined, not attached Exudate Amount: Medium Exudate Type: Serosanguineous Exudate Color: red, brown Foul Odor After Cleansing:  No Slough/Fibrino Yes Wound Bed Granulation Amount: Large (67-100%) Exposed Structure Granulation Quality: Pink Fascia Exposed: No Necrotic Amount: Small (1-33%) Fat Layer (Subcutaneous Tissue) Exposed: Yes Necrotic Quality: Adherent Slough Tendon Exposed: No Muscle Exposed: Yes Necrosis of Muscle: No Joint Exposed: No Bone Exposed: No Electronic Signature(s) Signed: 03/11/2020 5:50:16 PM By: Carlene Coria RN Entered By: Carlene Coria on 03/04/2020 10:08:27 -------------------------------------------------------------------------------- Vitals Details Patient Name: Date of Service: [name], [name] NDA T. 03/04/2020 9:15 A M Medical Record Number: [id number] Patient Account Number: [id number] Date of Birth/Sex: Treating RN: February 06, 1946 (74 y.o. Orvan Falconer Primary Care Keora Eccleston: Wilfred Lacy Other Clinician: Referring Strummer Canipe: Treating Jerra Huckeby/Extender: Alvira Monday in Treatment: 3 Vital Signs Time Taken: 10:04 Temperature (F): 98.4 Height (in): 65 Pulse (bpm): 87 Weight (lbs): 142 Respiratory Rate (breaths/min): 18 Body Mass Index (BMI): 23.6 Blood Pressure (mmHg): 120/66 Reference Range: 80 - 120 mg / dl Electronic Signature(s) Signed: 03/11/2020 5:50:16 PM By: Carlene Coria RN Entered By: Carlene Coria on 03/04/2020 10:05:26

## 2020-03-12 NOTE — Progress Notes (Addendum)
[facility] Progress Note  Subjective: labs pending this am, 250 cc UOP recorded. Seen in room. Breathing looks better today.   Vitals:   03/11/20 2353 03/12/20 0350 03/12/20 0500 03/12/20 0804  BP: (!) 149/87 (!) 143/89  (!) 152/94  Pulse: 70 70  70  Resp: 18 18    Temp: 98.1 F (36.7 C) 97.9 F (36.6 C)  97.7 F (36.5 C)  TempSrc:  Oral  Oral  SpO2: 96% 100%  98%  Weight:   64.7 kg   Height:        Exam: Gen alert, frail , breathing easier today No jvd or bruits Chest mild rales bilat bases RRR no MRG Abd soft ntnd no mass or ascites +bs Ext no LE or UE edema Neuro is awake, alert, no asterixis  LUA AVF +bruit    Home meds:  - lasix 80 bid/ kdur 20 qd/ coreg 12.5 bid   - prograf 2 mg bid/ myfortic 180 bid/ pred 5 qd  - cymbalta 30/ symbicort 2 bid  - prn's/ vitamins/ supplements    CXR 8/27 - IMPRESSION:  Cardiomegaly with hyperinflation and chronic interstitial changes.  Patchy airspace disease at the bases, stable to minimally progressed.     Na 137  K 6.2  CO2 29  BUN 72  Cr 2.81  Ca 9.3  Alb 3.6  LFT's wnl'   Hb 12  WBC 8k  plt 217   COVID +   B/l creat from April - May 2021 = 2.1- 2.3, eGFR 24- 28 ml/min    Assessment/ Plan: 1. AoCKD 4 of renal transplant - b/l creat from April - May 2021 = 2.1- 2.3, eGFR 24- 28 ml/min. Admit creat 2.8, improved today to 2.3. No vol excess on exam. Cont IS w/  myfortic and prograf , pred on hold due to IV steroids for COVID.  Give IVF 40/hr x 24 hrs. Will check prograf level. Will follow.  2. BP/volume - BP's normal, cont home coreg 12.5 bid 3. COVID PNA/ hypoxemia - sig CXR changes, suspect viral pna. Per primary team getting remdesivir and IV steroids.  4. Hyperkalemia - sp acute protocol meds in ED.  Cont lokelma 10 bid and have changed to renal (lowK) diet.  5. H/o CVA 6. H/o DVT 7. Atrial fib - not on anticoag, +coreg        Rob Micaila Ziemba 03/12/2020, 8:48 AM   Recent Labs  Lab  03/11/20 1029 03/11/20 1030 03/11/20 1642  K  --  6.2*  --   BUN  --  72*  --   CREATININE  --  2.81* 2.78*  CALCIUM  --  9.3  --   HGB 12.3  --  11.7*   Inpatient medications: . albuterol  10 mg Nebulization Once  . vitamin C  500 mg Oral Daily  . carvedilol  12.5 mg Oral BID  . dexamethasone (DECADRON) injection  6 mg Intravenous Q24H  . DULoxetine  30 mg Oral Daily  . enoxaparin (LOVENOX) injection  30 mg Subcutaneous Q24H  . mycophenolate  180 mg Oral BID  . tacrolimus  2 mg Oral BID  . zinc sulfate  220 mg Oral Daily   . remdesivir 100 mg in NS 100 mL     acetaminophen **OR** acetaminophen, chlorpheniramine-HYDROcodone, guaiFENesin-dextromethorphan

## 2020-03-13 LAB — RENAL FUNCTION PANEL
Albumin: 3.1 g/dL — ABNORMAL LOW (ref 3.5–5.0)
Anion gap: 11 (ref 5–15)
BUN: 77 mg/dL — ABNORMAL HIGH (ref 8–23)
CO2: 28 mmol/L (ref 22–32)
Calcium: 8.8 mg/dL — ABNORMAL LOW (ref 8.9–10.3)
Chloride: 99 mmol/L (ref 98–111)
Creatinine, Ser: 2.31 mg/dL — ABNORMAL HIGH (ref 0.44–1.00)
GFR calc Af Amer: 24 mL/min — ABNORMAL LOW (ref >60)
GFR calc non Af Amer: 21 mL/min — ABNORMAL LOW (ref >60)
Glucose, Bld: 141 mg/dL — ABNORMAL HIGH (ref 70–99)
Phosphorus: 4.5 mg/dL (ref 2.5–4.6)
Potassium: 4.6 mmol/L (ref 3.5–5.1)
Sodium: 138 mmol/L (ref 135–145)

## 2020-03-13 LAB — CBC
HCT: 42.1 % (ref 36.0–46.0)
Hemoglobin: 13.1 g/dL (ref 12.0–15.0)
MCH: 24.9 pg — ABNORMAL LOW (ref 26.0–34.0)
MCHC: 31.1 g/dL (ref 30.0–36.0)
MCV: 79.9 fL — ABNORMAL LOW (ref 80.0–100.0)
Platelets: 269 10*3/uL (ref 150–400)
RBC: 5.27 MIL/uL — ABNORMAL HIGH (ref 3.87–5.11)
RDW: 16 % — ABNORMAL HIGH (ref 11.5–15.5)
WBC: 4.1 10*3/uL (ref 4.0–10.5)
nRBC: 0 % (ref 0.0–0.2)

## 2020-03-13 LAB — C-REACTIVE PROTEIN: CRP: 9.8 mg/dL — ABNORMAL HIGH (ref <1.0)

## 2020-03-13 LAB — D-DIMER, QUANTITATIVE: D-Dimer, Quant: 0.98 ug/mL-FEU — ABNORMAL HIGH (ref 0.00–0.50)

## 2020-03-13 LAB — FERRITIN: Ferritin: 352 ng/mL — ABNORMAL HIGH (ref 11–307)

## 2020-03-13 MED ORDER — MAGNESIUM OXIDE 400 MG PO TABS: 800.0000 mg | ORAL_TABLET | Freq: Two times a day (BID) | ORAL | 0 refills | Status: AC

## 2020-03-13 MED ORDER — PREDNISONE 10 MG PO TABS: ORAL_TABLET | ORAL | 0 refills | Status: AC

## 2020-03-13 MED ORDER — PREDNISONE 5 MG PO TABS: 5.0000 mg | ORAL_TABLET | Freq: Every day | ORAL | 3 refills | Status: DC

## 2020-03-13 NOTE — Social Work (Signed)
TOC CSW spoke with [name]/pts husband at [phone].  CSW inquired to Jane Parr the home health agency involved with pts PT and OT.  Jane Parr stated it was [place].  Pt has been receiving services for 3-4 months.  OT ended about 2 months ago.  PT ([name]) comes out twice a week.  CSW will continue to follow for dc needs.  [name], MSW, LCSW-A                  [name] ED Transitions of CareClinical Social Worker [name] [phone]

## 2020-03-13 NOTE — Discharge Summary (Signed)
Physician Discharge Summary  [name] UYQ:[id number] DOB: Nov 29, 1945 DOA: 03/11/2020  PCP: [name], NP  Admit date: 03/11/2020 Discharge date: 03/13/2020  Admitted From: Home Disposition:  Home  Recommendations for Outpatient Follow-up:  1. Follow up with PCP in 1-2 weeks 2. Please obtain BMP/CBC in one week 3. Please follow up on the following pending results:  Equipment/Devices: Oxygen 2L [place]  Discharge Condition: Stable CODE STATUS: Full Diet recommendation: Renal diet as tolerated  Brief/Interim Summary: [name] a 74 y.o.femalewith medical history significant ofend-stage renal disease status post renal transplant 2017 currently on chronic immunosuppression, atrial fibrillation, pacemaker placement, CKD who initially presented to her primary care provider office on 03/10/2020 with complaints of 2-week history of increased congestion,weakness and shortness of breath. Patient was noted to be mildly hypoxemic with O2 sat of 94% on room air in the office. Patient was thought to have acute heart failure and was thus referred to CHF clinic. On the day of hospital visit, patient went to wound care appointment regarding a wound on her right leg and was there found to be hypoxemic with O2 saturation into the 60s on ambulation. Patient was therefore referred to the emergency department for further work-up. Of note, pt's husband has been having URI symptoms as well.In the emergency department, patient was noted to have chest x-ray findings suggestive of bilateral infiltrates. Mildly tachypneic with respiratory rate in excess of 30 breaths/min with O2 saturation in the low 90s while at rest on room air. She was also noted to have a potassium 6.2 with a creatinine of 2.8. Nephrology was consulted regarding elevated potassium and patient did later received IV insulin with IV dextrose with IV bicarb.Covid test later returned positive for COVID-19. Hospitalist service  consulted for consideration for medical admission.  Patient admitted as above with worsening fatigue weakness and mild transient hypoxia in the setting of COVID-19 pneumonia.  Patient is vaccinated but high risk due to her immunocompromise state chronic prednisone and advanced age and comorbid's.  Patient's symptoms and hypoxia improved drastically over the first 24 hours, patient is now able to ambulate without moderate hypoxia, requiring only 2 L nasal cannula with exertion.  Patient's clinical course likely rapidly improved in the setting of previous vaccination.  At this time patient is otherwise stable and agreeable for discharge home, mild AKI noted at intake, nephrology was consulted for evaluation in the setting of renal transplant with IV fluid recommendations and increased p.o. free water intake.  Creatinine is now resolved essentially back to baseline.  Will need close follow-up in the outpatient setting with PCP as well as nephrology as previously scheduled.  She will be continued on the remainder of her steroid taper as well as she requires 2 additional doses of Remdesivir which have been scheduled for Monday and Tuesday per paperwork.  Patient otherwise stable and agreeable for discharge home.  Discharge Diagnoses:  Principal Problem:   Pneumonia due to COVID-19 virus Active Problems:   Essential hypertension   CVA (cerebrovascular accident due to intracerebral hemorrhage) (HCC)   End stage renal disease ([place])   Renal transplant recipient   Chronic respiratory failure with hypoxia (HCC)   Hyperkalemia   Personal history of PE (pulmonary embolism)   Immunosuppression ([place])   Cardiac pacemaker in situ   SOB (shortness of breath)    Discharge Instructions  Discharge Instructions    Discharge instructions   Complete by: As directed    ?   Person Under Monitoring Name: Jane  Arta [name]  Location: [address] [place] [address]   Infection Prevention Recommendations for  Individuals Confirmed to have, or Being Evaluated for, 2019 Novel Coronavirus (COVID-19) Infection Who Receive Care at Home  Individuals who are confirmed to have, or are being evaluated for, COVID-19 should follow the prevention steps below until a healthcare provider or local or state health department says they can return to normal activities.  Stay home except to get medical care You should restrict activities outside your home, except for getting medical care. Do not go to work, school, or public areas, and do not use public transportation or taxis.  Call ahead before visiting your doctor Before your medical appointment, call the healthcare provider and tell them that you have, or are being evaluated for, COVID-19 infection. This will help the healthcare provider's office take steps to keep other people from getting infected. Ask your healthcare provider to call the local or state health department.  Monitor your symptoms Seek prompt medical attention if your illness is worsening (e.g., difficulty breathing). Before going to your medical appointment, call the healthcare provider and tell them that you have, or are being evaluated for, COVID-19 infection. Ask your healthcare provider to call the local or state health department.  Wear a facemask You should wear a facemask that covers your nose and mouth when you are in the same room with other people and when you visit a healthcare provider. People who live with or visit you should also wear a facemask while they are in the same room with you.  Separate yourself from other people in your home As much as possible, you should stay in a different room from other people in your home. Also, you should use a separate bathroom, if available.  Avoid sharing household items You should not share dishes, drinking glasses, cups, eating utensils, towels, bedding, or other items with other people in your home. After using these items, you  should wash them thoroughly with soap and water.  Cover your coughs and sneezes Cover your mouth and nose with a tissue when you cough or sneeze, or you can cough or sneeze into your sleeve. Throw used tissues in a lined trash can, and immediately wash your hands with soap and water for at least 20 seconds or use an alcohol-based hand rub.  Wash your [facility] your hands often and thoroughly with soap and water for at least 20 seconds. You can use an alcohol-based hand sanitizer if soap and water are not available and if your hands are not visibly dirty. Avoid touching your eyes, nose, and mouth with unwashed hands.   Prevention Steps for Caregivers and Household Members of Individuals Confirmed to have, or Being Evaluated for, COVID-19 Infection Being Cared for in the Home  If you live with, or provide care at home for, a person confirmed to have, or being evaluated for, COVID-19 infection please follow these guidelines to prevent infection:  Follow healthcare provider's instructions Make sure that you understand and can help the patient follow any healthcare provider instructions for all care.  Provide for the patient's basic needs You should help the patient with basic needs in the home and provide support for getting groceries, prescriptions, and other personal needs.  Monitor the patient's symptoms If they are getting sicker, call his or her medical provider and tell them that the patient has, or is being evaluated for, COVID-19 infection. This will help the healthcare provider's office take steps to keep other  people from getting infected. Ask the healthcare provider to call the local or state health department.  Limit the number of people who have contact with the patient If possible, have only one caregiver for the patient. Other household members should stay in another home or place of residence. If this is not possible, they should stay in another room, or be separated  from the patient as much as possible. Use a separate bathroom, if available. Restrict visitors who do not have an essential need to be in the home.  Keep older adults, very young children, and other sick people away from the patient Keep older adults, very young children, and those who have compromised immune systems or chronic health conditions away from the patient. This includes people with chronic heart, lung, or kidney conditions, diabetes, and cancer.  Ensure good ventilation Make sure that shared spaces in the home have good air flow, such as from an air conditioner or an opened window, weather permitting.  Wash your hands often Wash your hands often and thoroughly with soap and water for at least 20 seconds. You can use an alcohol based hand sanitizer if soap and water are not available and if your hands are not visibly dirty. Avoid touching your eyes, nose, and mouth with unwashed hands. Use disposable paper towels to dry your hands. If not available, use dedicated cloth towels and replace them when they become wet.  Wear a facemask and gloves Wear a disposable facemask at all times in the room and gloves when you touch or have contact with the patient's blood, body fluids, and/or secretions or excretions, such as sweat, saliva, sputum, nasal mucus, vomit, urine, or feces.  Ensure the mask fits over your nose and mouth tightly, and do not touch it during use. Throw out disposable facemasks and gloves after using them. Do not reuse. Wash your hands immediately after removing your facemask and gloves. If your personal clothing becomes contaminated, carefully remove clothing and launder. Wash your hands after handling contaminated clothing. Place all used disposable facemasks, gloves, and other waste in a lined container before disposing them with other household waste. Remove gloves and wash your hands immediately after handling these items.  Do not share dishes, glasses, or other  household items with the patient Avoid sharing household items. You should not share dishes, drinking glasses, cups, eating utensils, towels, bedding, or other items with a patient who is confirmed to have, or being evaluated for, COVID-19 infection. After the person uses these items, you should wash them thoroughly with soap and water.  Wash laundry thoroughly Immediately remove and wash clothes or bedding that have blood, body fluids, and/or secretions or excretions, such as sweat, saliva, sputum, nasal mucus, vomit, urine, or feces, on them. Wear gloves when handling laundry from the patient. Read and follow directions on labels of laundry or clothing items and detergent. In general, wash and dry with the warmest temperatures recommended on the label.  Clean all areas the individual has used often Clean all touchable surfaces, such as counters, tabletops, doorknobs, bathroom fixtures, toilets, phones, keyboards, tablets, and bedside tables, every day. Also, clean any surfaces that may have blood, body fluids, and/or secretions or excretions on them. Wear gloves when cleaning surfaces the patient has come in contact with. Use a diluted bleach solution (e.g., dilute bleach with 1 part bleach and 10 parts water) or a household disinfectant with a label that says EPA-registered for coronaviruses. To make a bleach solution at home, add 1  tablespoon of bleach to 1 quart (4 cups) of water. For a larger supply, add  cup of bleach to 1 gallon (16 cups) of water. Read labels of cleaning products and follow recommendations provided on product labels. Labels contain instructions for safe and effective use of the cleaning product including precautions you should take when applying the product, such as wearing gloves or eye protection and making sure you have good ventilation during use of the product. Remove gloves and wash hands immediately after cleaning.  Monitor yourself for signs and symptoms of  illness Caregivers and household members are considered close contacts, should monitor their health, and will be asked to limit movement outside of the home to the extent possible. Follow the monitoring steps for close contacts listed on the symptom monitoring form.   ? If you have additional questions, contact your local health department or call the epidemiologist on call at [phone] (available 24/7). ? This guidance is subject to change. 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   Ventolin HFA 108 (90 Base) MCG/ACT inhaler Generic drug: albuterol Inhale 2 puffs into the lungs every 4 (four) hours as needed for wheezing or shortness of breath.   albuterol (2.5 MG/3ML) 0.083% nebulizer solution Commonly known as: PROVENTIL Take 3 mLs (2.5 mg total) by nebulization every 6 (six) hours as needed for wheezing or shortness of breath. Dx:J45.909   budesonide-formoterol 160-4.5 MCG/ACT inhaler Commonly known as: Symbicort Inhale 2 puffs into the lungs 2 (two) times daily.   calcitRIOL 0.25 MCG capsule Commonly known as: ROCALTROL Take 0.25 mcg by mouth every other  day.   carvedilol 12.5 MG tablet Commonly known as: COREG Take 2 tablets (25 mg total) by mouth 2 (two) times daily.   docusate sodium 100 MG capsule Commonly known as: COLACE Take 100 mg by mouth daily.   DULoxetine 30 MG capsule Commonly known as: CYMBALTA TAKE 1 CAPSULE BY MOUTH EVERY DAY What changed: how much to take   furosemide 80 MG tablet Commonly known as: LASIX Take 1 tablet (80 mg total) by mouth 2 (two) times daily. What changed: how much to take   IRON PO Take 325 mg by mouth daily.   magnesium oxide 400 MG tablet Commonly known as: MAG-OX Take 2 tablets (800 mg total) by mouth 2 (two) times daily. What changed: how much to take   Myfortic 180 MG EC tablet Generic drug: mycophenolate Take 180 mg by mouth 2 (two) times daily.   ondansetron 4 MG tablet Commonly known as: ZOFRAN Take 1 tablet (4 mg total) by mouth every  6 (six) hours as needed for nausea.   potassium chloride SA 20 MEQ tablet Commonly known as: KLOR-CON Take 20 mEq by mouth daily.   predniSONE 10 MG tablet Commonly known as: DELTASONE Take 4 tablets (40 mg total) by mouth daily for 3 days, THEN 3 tablets (30 mg total) daily for 3 days, THEN 2 tablets (20 mg total) daily for 3 days, THEN 1 tablet (10 mg total) daily for 3 days. Start taking on: March 13, 2020 What changed: You were already taking a medication with the same name, and this prescription was added. Make sure you understand how and when to take each.   predniSONE 5 MG tablet Commonly known as: DELTASONE Take 1 tablet (5 mg total) by mouth daily. Start taking on: March 25, 2020 What changed: These instructions start on March 25, 2020. If you are unsure what to do until then, ask your doctor or other care provider.   tacrolimus 1 MG capsule Commonly known as: PROGRAF Take 2 mg by mouth 2 (two) times daily.            Durable Medical Equipment  (From admission, onward)         Start     Ordered   03/13/20  1358  DME Oxygen  Once       Question Answer Comment  Length of Need 6 Months   Mode or (Route) Nasal cannula   Liters per Minute 2   Oxygen conserving device No   Oxygen delivery system Gas      03/13/20 1357          Allergies  Allergen Reactions  . Ace Inhibitors Other (See Comments)    Reaction unknown  . 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 Warfarin Other (See Comments)    Left side brain hemorrhage  . Warfarin Sodium Other (See Comments)    Caused her to have a stroke    Consultations:  Nephrology   Procedures/Studies: DG Chest 2 View  Result Date: 03/11/2020 CLINICAL DATA:  Shortness of breath. EXAM: CHEST - 2 VIEW COMPARISON:  03/10/2020 FINDINGS: 0946 hours. The cardio pericardial silhouette is enlarged. Interstitial markings are diffusely coarsened with chronic features. There is pulmonary vascular congestion without overt pulmonary edema. Bibasilar airspace disease is stable to minimally progressive. No pleural effusion. Status post left atrial appendage occlusion. Single lead pacer remains in place. Telemetry leads overlie the chest. Lap band noted left upper quadrant and IVC filter overlies the upper lumbar spine. IMPRESSION: Cardiomegaly with hyperinflation and chronic interstitial changes. Patchy airspace disease at the bases, stable to minimally progressed. Electronically Signed   By: Misty Stanley M.D.   On: 03/11/2020 09:59   DG Chest 2 View  Result Date: 03/10/2020 CLINICAL DATA:  Shortness of breath. EXAM: CHEST - 2 VIEW COMPARISON:  02/27/2020. FINDINGS: Cardiac pacer with lead tip over the right ventricle. Postsurgical changes noted of chest with occlusion device and coils noted over the left chest. Chronic interstitial changes again noted. Active right base infiltrate cannot be excluded. No pleural effusion or pneumothorax. Lap band noted stable position. IMPRESSION: 1. Cardiac pacer in  stable position. Stable postsurgical changes noted the chest. Stable cardiomegaly. 2. Chronic interstitial lung disease. Superimposed acute infiltrate right lung base cannot be excluded. Electronically Signed   By: Marcello Moores  Register   On: 03/10/2020 12:24   US Renal Transplant w/Doppler  Result Date: 03/11/2020 CLINICAL DATA:  Acute renal failure EXAM: ULTRASOUND OF RENAL TRANSPLANT WITH RENAL DOPPLER ULTRASOUND TECHNIQUE: Ultrasound examination of the renal transplant was performed with gray-scale, color and duplex doppler evaluation. COMPARISON:  February 03, 2018 FINDINGS: Transplant kidney location: RLQ Transplant Kidney: Renal measurements: 11.9 cm x 4.6 cm x 4.7 cm = volume: 128.85mL. Normal in size and parenchymal echogenicity. No evidence of mass or hydronephrosis. No peri-transplant fluid collection seen. Color flow in the main renal artery:  Yes Color flow in the main renal vein:  Yes Duplex Doppler Evaluation: Main Renal Artery Velocity: 22 cm/sec Main Renal Artery Resistive Index: N/A Venous waveform in main renal vein:  present Intrarenal resistive index in upper pole:  0.51 (normal 0.6-0.8; equivocal 0.8-0.9; abnormal >= 0.9) Intrarenal resistive index in lower pole: 1.0 (normal 0.6-0.8; equivocal 0.8-0.9; abnormal >= 0.9) Bladder: Normal for degree of bladder distention. Other findings:  None. IMPRESSION: 1. Normal appearance of the right lower quadrant renal transplant without evidence of hydronephrosis. 2. Elevated lower pole intrarenal resistive index. Electronically Signed   By: Virgina Norfolk M.D.   On: 03/11/2020 18:50   MM 3D SCREEN BREAST BILATERAL  Result Date: 03/08/2020 CLINICAL DATA:  Screening. EXAM: DIGITAL SCREENING BILATERAL MAMMOGRAM WITH TOMO AND CAD COMPARISON:  Previous exam(s). ACR Breast Density Category b: There are scattered areas of fibroglandular density. FINDINGS: There are no findings suspicious for malignancy. Images were processed with CAD. IMPRESSION: No  mammographic evidence of malignancy. A result letter of this screening mammogram will be mailed directly to the patient. RECOMMENDATION: Screening mammogram in one year. (Code:SM-B-01Y) BI-RADS CATEGORY  1: Negative. Electronically Signed   By: Dorise Bullion III M.D   On: 03/08/2020 17:00     Subjective: No acute issues or events overnight, patient's dyspnea fatigue and weakness has resolved, minimal hypoxia on exertion but patient remains without symptoms.  Otherwise denies nausea, vomiting, diarrhea, constipation, headache, fevers, chills.   Discharge Exam: Vitals:   03/13/20 0449 03/13/20 1206  BP: (!) 140/93 (!) 145/84  Pulse: 69 70  Resp: 18   Temp: 98.4 F (36.9 C)   SpO2: 100% 94%   Vitals:   03/12/20 1420 03/12/20 2032 03/13/20 0449 03/13/20 1206  BP: 136/80 (!) 142/97 (!) 140/93 (!) 145/84  Pulse: 70 70 69 70  Resp: 16 16 18    Temp: (!) 97.5 F (36.4 C) 97.7 F (36.5 C) 98.4 F (36.9 C)   TempSrc: Oral Oral Oral   SpO2: 97% 98% 100% 94%  Weight:      Height:        General: Pt is alert, awake, not in acute distress Cardiovascular: RRR, S1/S2 +, no rubs, no gallops Respiratory: CTA bilaterally, no wheezing, no rhonchi Abdominal: Soft, NT, ND, bowel sounds + Extremities: no edema, no cyanosis    The results of significant diagnostics from this hospitalization (including imaging, microbiology, ancillary and laboratory) are listed below for reference.     Microbiology: Recent Results (from the past 240 hour(s))  SARS Coronavirus 2 by RT PCR (hospital order, performed in [facility] hospital lab) Nasopharyngeal Nasopharyngeal Swab     Status: Abnormal   Collection Time: 03/11/20 10:44 AM   Specimen: Nasopharyngeal Swab  Result Value Ref Range Status   SARS Coronavirus 2 POSITIVE (A) NEGATIVE Final    Comment: CRITICAL RESULT CALLED TO, READ BACK BY AND VERIFIED WITH: BALDWIN,L. RN @1343  03/11/20 BILLINGSLEY,L (NOTE) SARS-CoV-2 target nucleic acids are  DETECTED  SARS-CoV-2 RNA is generally detectable in upper respiratory specimens  during the acute  phase of infection.  Positive results are indicative  of the presence of the identified virus, but do not rule out bacterial infection or co-infection with other pathogens not detected by the test.  Clinical correlation with patient history and  other diagnostic information is necessary to determine patient infection status.  The expected result is negative.  Fact Sheet for Patients:   StrictlyIdeas.no   Fact Sheet for Healthcare Providers:   BankingDealers.co.za    This test is not yet approved or cleared by the [place] FDA and  has been authorized for detection and/or diagnosis of SARS-CoV-2 by FDA under an Emergency Use Authorization (EUA).  This EUA will remain in effect  (meaning this test can be used) for the duration of  the COVID-19 declaration under Section 564(b)(1) of the Act, 21 U.S.C. section 360-bbb-3(b)(1), unless the authorization is terminated or revoked sooner.  Performed at [facility], [place] [address], [place], [place] [address]      Labs: BNP (last 3 results) Recent Labs    10/22/19 1100 03/11/20 1029  BNP 970.7* 938.1*   Basic Metabolic Panel: Recent Labs  Lab 03/11/20 1030 03/11/20 1639 03/11/20 1642 03/12/20 0922 03/13/20 1118  NA 137  --   --  140 138  K 6.2*  --   --  5.4* 4.6  CL 97*  --   --  100 99  CO2 29  --   --  28 28  GLUCOSE 140* 149*  --  116* 141*  BUN 72*  --   --  65* 77*  CREATININE 2.81*  --  2.78* 2.35* 2.31*  CALCIUM 9.3  --   --  9.0 8.8*  PHOS  --   --   --  4.5 4.5   Liver Function Tests: Recent Labs  Lab 03/11/20 1030 03/12/20 0922 03/13/20 1118  AST 36  --   --   ALT 23  --   --   ALKPHOS 96  --   --   BILITOT 0.9  --   --   PROT 6.9  --   --   ALBUMIN 3.6 3.0* 3.1*   No results for input(s): LIPASE, AMYLASE in the last 168 hours. No  results for input(s): AMMONIA in the last 168 hours. CBC: Recent Labs  Lab 03/11/20 1029 03/11/20 1642 03/12/20 0922 03/13/20 1118  WBC 8.3 6.6 3.6* 4.1  HGB 12.3 11.7* 13.1 13.1  HCT 40.2 38.8 42.5 42.1  MCV 82.7 83.6 82.2 79.9*  PLT 217 203 234 269   Cardiac Enzymes: No results for input(s): CKTOTAL, CKMB, CKMBINDEX, TROPONINI in the last 168 hours. BNP: Invalid input(s): POCBNP CBG: Recent Labs  Lab 03/11/20 1625  GLUCAP 138*   D-Dimer Recent Labs    03/12/20 0424 03/13/20 1118  DDIMER 1.35* 0.98*   Hgb A1c No results for input(s): HGBA1C in the last 72 hours. Lipid Profile No results for input(s): CHOL, HDL, LDLCALC, TRIG, CHOLHDL, LDLDIRECT in the last 72 hours. Thyroid function studies No results for input(s): TSH, T4TOTAL, T3FREE, THYROIDAB in the last 72 hours.  Invalid input(s): FREET3 Anemia work up Recent Labs    03/12/20 0424 03/13/20 1118  FERRITIN 391* 352*   Urinalysis    Component Value Date/Time   COLORURINE AMBER (A) 02/04/2018 1540   APPEARANCEUR CLOUDY (A) 02/04/2018 1540   LABSPEC 1.017 02/04/2018 1540   PHURINE 5.0 02/04/2018 1540   GLUCOSEU NEGATIVE 02/04/2018 1540   HGBUR LARGE (A) 02/04/2018 1540   HGBUR  negative 10/04/2009 1044   BILIRUBINUR NEGATIVE 02/04/2018 1540   BILIRUBINUR negative 12/13/2017 1508   KETONESUR NEGATIVE 02/04/2018 1540   PROTEINUR 100 (A) 02/04/2018 1540   UROBILINOGEN 0.2 12/13/2017 1508   UROBILINOGEN 0.2 03/25/2012 1354   NITRITE NEGATIVE 02/04/2018 1540   LEUKOCYTESUR MODERATE (A) 02/04/2018 1540   Sepsis Labs Invalid input(s): PROCALCITONIN,  WBC,  LACTICIDVEN Microbiology Recent Results (from the past 240 hour(s))  SARS Coronavirus 2 by RT PCR (hospital order, performed in [place] hospital lab) Nasopharyngeal Nasopharyngeal Swab     Status: Abnormal   Collection Time: 03/11/20 10:44 AM   Specimen: Nasopharyngeal Swab  Result Value Ref Range Status   SARS Coronavirus 2 POSITIVE (A)  NEGATIVE Final    Comment: CRITICAL RESULT CALLED TO, READ BACK BY AND VERIFIED WITH: BALDWIN,L. RN @1343  03/11/20 BILLINGSLEY,L (NOTE) SARS-CoV-2 target nucleic acids are DETECTED  SARS-CoV-2 RNA is generally detectable in upper respiratory specimens  during the acute phase of infection.  Positive results are indicative  of the presence of the identified virus, but do not rule out bacterial infection or co-infection with other pathogens not detected by the test.  Clinical correlation with patient history and  other diagnostic information is necessary to determine patient infection status.  The expected result is negative.  Fact Sheet for Patients:   StrictlyIdeas.no   Fact Sheet for Healthcare Providers:   BankingDealers.co.za    This test is not yet approved or cleared by the [place] FDA and  has been authorized for detection and/or diagnosis of SARS-CoV-2 by FDA under an Emergency Use Authorization (EUA).  This EUA will remain in effect  (meaning this test can be used) for the duration of  the COVID-19 declaration under Section 564(b)(1) of the Act, 21 U.S.C. section 360-bbb-3(b)(1), unless the authorization is terminated or revoked sooner.  Performed at [facility], [place] [address], [place], [place] [address]      Time coordinating discharge: Over 30 minutes  SIGNED:   Little Ishikawa, DO [facility] 03/13/2020, 2:03 PM Pager   If 7PM-7AM, please contact night-coverage [email or web address]

## 2020-03-13 NOTE — Discharge Instructions (Signed)
You are scheduled for a Remdesivir infusion on 8/30 and 8/31 at 3pm. Please come to [place], you will see a COVID infusion banner by the road.  Enter there and turn left.  There are marked spaces for Infusion. Call the number on the sign or [phone] and someone will come out and bring you inside. If someone is driving you please come to the same area and call the number and someone will come outside to get you. Thank you!

## 2020-03-13 NOTE — TOC Progression Note (Signed)
Transition of Care [facility]) - Progression Note    Patient Details  Name: [name] MRN: [id number] Date of Birth: [birth year]  Transition of Care [facility]) CM/SW Contact  Joaquin Courts, RN Phone Number: 03/13/2020, 3:01 PM  Clinical Narrative:   Patient active with [facility] for HHPT/OT. Agency notified to resume services.  [facility] to deliver home oxygen.     Expected Discharge Plan: [place] Barriers to Discharge: No Barriers Identified  Expected Discharge Plan and Services Expected Discharge Plan: [place]   Discharge Planning Services: CM Consult Post Acute Care Choice: Resumption of Svcs/PTA Provider Living arrangements for the past 2 months: Single Family Home Expected Discharge Date: 03/13/20               DME Arranged: Oxygen DME Agency: [facility] Date DME Agency Contacted: 03/13/20 Time DME Agency Contacted: [phone] Representative spoke with at DME Agency: on-call referral number HH Arranged: PT, OT [place] Agency: [place] ([facility]) Date [place]: 03/13/20 Time [place]: [place] (SDOH) Interventions    Readmission Risk Interventions Readmission Risk Prevention Plan 11/18/2019  Transportation Screening Complete  PCP or Specialist Appt within 3-5 Days Complete  HRI or [place] Complete  Social Work Consult for [place] Planning/Counseling Complete  Palliative Care Screening Not Applicable  Medication Review Press photographer) Complete  Some recent data might be hidden

## 2020-03-13 NOTE — Progress Notes (Signed)
[name], [name] ([id number]) . Visit Report for 03/11/2020 Arrival Information Details Patient Name: Date of Service: [name] [name]. 03/11/2020 8:15 A M Medical Record Number: [id number] Patient Account Number: [id number] Date of Birth/Sex: Treating RN: 06-15-1946 (74 y.o. [name] Primary Care : Wilfred Lacy Other Clinician: Referring : Treating /Extender: Alvira Monday in Treatment: 4 Visit Information History Since Last Visit Added or deleted any medications: No Patient Arrived: Ambulatory Any new allergies or adverse reactions: No Arrival Time: 08:23 Had a fall or experienced change in No Accompanied By: husband activities of daily living that may affect Transfer Assistance: None risk of falls: Patient Identification Verified: Yes Signs or symptoms of abuse/neglect since last visito No Secondary Verification Process Completed: Yes Hospitalized since last visit: No Patient Has Alerts: Yes Implantable device outside of the clinic excluding No Patient Alerts: Patient on Blood Thinner cellular tissue based products placed in the center takes aspirin since last visit: Has Dressing in Place as Prescribed: Yes Has Compression in Place as Prescribed: Yes Pain Present Now: No Electronic Signature(s) Signed: 03/11/2020 6:01:16 PM By: Levan Hurst RN, BSN Entered By: Levan Hurst on 03/11/2020 08:25:55 -------------------------------------------------------------------------------- Clinic Level of Care Assessment Details Patient Name: Date of Service: [name], [place] [name]. 03/11/2020 8:15 A M Medical Record Number: [id number] Patient Account Number: [id number] Date of Birth/Sex: Treating RN: Aug 30, 1945 (74 y.o. [name] Primary Care : Wilfred Lacy Other Clinician: Referring : Treating /Extender: Alvira Monday in Treatment: 4 Clinic Level of Care Assessment  Items TOOL 4 Quantity Score X- 1 0 Use when only an EandM is performed on FOLLOW-UP visit ASSESSMENTS - Nursing Assessment / Reassessment X- 1 10 Reassessment of Co-morbidities (includes updates in patient status) X- 1 5 Reassessment of Adherence to Treatment Plan ASSESSMENTS - Wound and Skin A ssessment / Reassessment X - Simple Wound Assessment / Reassessment - one wound 1 5 _0  - 0 Complex Wound Assessment / Reassessment - multiple wounds _1  - 0 Dermatologic / Skin Assessment (not related to wound area) ASSESSMENTS - Focused Assessment _2  - 0 Circumferential Edema Measurements - multi extremities _3  - 0 Nutritional Assessment / Counseling / Intervention _4  - 0 Lower Extremity Assessment (monofilament, tuning fork, pulses) _5  - 0 Peripheral Arterial Disease Assessment (using hand held doppler) ASSESSMENTS - Ostomy and/or Continence Assessment and Care _6  - 0 Incontinence Assessment and Management _7  - 0 Ostomy Care Assessment and Management (repouching, etc.) PROCESS - Coordination of Care X - Simple Patient / Family Education for ongoing care 1 15 _8  - 0 Complex (extensive) Patient / Family Education for ongoing care X- 1 10 Staff obtains Programmer, systems, Records, [name] Results / Process Orders est _9  - 0 Staff telephones HHA, Nursing Homes / Clarify orders / etc X- 1 10 Routine Transfer to another Facility (non-emergent condition) _10  - 0 Routine Hospital Admission (non-emergent condition) _11  - 0 New Admissions / Biomedical engineer / Ordering NPWT Apligraf, etc. , _12  - 0 Emergency Hospital Admission (emergent condition) X- 1 10 Simple Discharge Coordination _13  - 0 Complex (extensive) Discharge Coordination PROCESS - Special Needs _14  - 0 Pediatric / Minor Patient Management _15  - 0 Isolation Patient Management _16  - 0 Hearing / Language / Visual special needs _17  - 0 Assessment of Community assistance (transportation, D/C planning, etc.) _18  - 0 Additional  assistance / Altered mentation _19  - 0 Support Surface(s) Assessment (bed, cushion, seat, etc.) INTERVENTIONS - Wound Cleansing / Measurement X - Simple Wound  Cleansing - one wound 1 5 _0  - 0 Complex Wound Cleansing - multiple wounds _1  - 0 Wound Imaging (photographs - any number of wounds) _2  - 0 Wound Tracing (instead of photographs) X- 1 5 Simple Wound Measurement - one wound _3  - 0 Complex Wound Measurement - multiple wounds INTERVENTIONS - Wound Dressings X - Small Wound Dressing one or multiple wounds 1 10 _4  - 0 Medium Wound Dressing one or multiple wounds _5  - 0 Large Wound Dressing one or multiple wounds <WUJWJXBJYNWGNFAO>_1<\/HYQMVHQIONGEXBMW>_4  - 0 Application of Medications - topical <XLKGMWNUUVOZDGUY>_4<\/IHKVQQVZDGLOVFIE>_3  - 0 Application of Medications - injection INTERVENTIONS - Miscellaneous _8  - 0 External ear exam _9  - 0 Specimen Collection (cultures, biopsies, blood, body fluids, etc.) _10  - 0 Specimen(s) / Culture(s) sent or taken to Lab for analysis _11  - 0 Patient Transfer (multiple staff / Civil Service fast streamer / Similar devices) _12  - 0 Simple Staple / Suture removal (25 or less) _13  - 0 Complex Staple / Suture removal (26 or more) _14  - 0 Hypo / Hyperglycemic Management (close monitor of Blood Glucose) _15  - 0 Ankle / Brachial Index (ABI) - do not check if billed separately X- 1 5 Vital Signs Has the patient been seen at the hospital within the last three years: Yes Total Score: 90 Level Of Care: New/Established - Level 3 Electronic Signature(s) Signed: 03/11/2020 5:56:33 PM By: Kela Millin Entered By: Kela Millin on 03/11/2020 08:57:14 -------------------------------------------------------------------------------- Encounter Discharge Information Details Patient Name: Date of Service: [name], [name] [name]. 03/11/2020 8:15 A M Medical Record Number: [id number] Patient Account Number: [id number] Date of Birth/Sex: Treating RN: 05-02-1946 (74 y.o. [name] Primary Care : Wilfred Lacy Other  Clinician: Referring : Treating /Extender: Alvira Monday in Treatment: 4 Encounter Discharge Information Items Discharge Condition: Stable Ambulatory Status: Wheelchair Discharge Destination: Emergency Room Telephoned: Yes Spoke With: Patty Orders Sent: Yes Transportation: Other Accompanied By: husband Schedule Follow-up Appointment: Yes Clinical Summary of Care: Patient Declined Notes Patient transported to ER via wheelchair, in stable condition, accompanied by husband. Electronic Signature(s) Signed: 03/11/2020 6:01:16 PM By: Levan Hurst RN, BSN Entered By: Levan Hurst on 03/11/2020 09:21:25 -------------------------------------------------------------------------------- Multi Wound Chart Details Patient Name: Date of Service: [name], [name] [name]. 03/11/2020 8:15 A M Medical Record Number: [id number] Patient Account Number: [id number] Date of Birth/Sex: Treating RN: [birth year] (74 y.o. [name] Primary Care : Wilfred Lacy Other Clinician: Referring : Treating /Extender: Alvira Monday in Treatment: 4 Vital Signs Height(in): 65 Pulse(bpm): 65 Weight(lbs): 142 Blood Pressure(mmHg): 122/72 Body Mass Index(BMI): 24 Temperature(F): 98.7 Respiratory Rate(breaths/min): 24 Photos: [2:No Photos Right, Medial Lower Leg] [N/A:N/A N/A] Wound Location: [2:Trauma] [N/A:N/A] Wounding Event: [2:Trauma, Other] [N/A:N/A] Primary Etiology: [2:Cataracts, Anemia, Asthma,] [N/A:N/A] Comorbid History: [2:Arrhythmia, Congestive Heart Failure, Hypertension, End Stage Renal Disease, Gout 12/15/2019] [N/A:N/A] Date Acquired: [2:4] [N/A:N/A] Weeks of Treatment: [2:Open] [N/A:N/A] Wound Status: [2:1.2x1x0.2] [N/A:N/A] Measurements L x W x D (cm) [2:0.942] [N/A:N/A] A (cm) : rea [2:0.188] [N/A:N/A] Volume (cm) : [2:9.20%] [N/A:N/A] % Reduction in Area: [2:89.30%] [N/A:N/A] % Reduction in  Volume: [2:Full Thickness With Exposed Support N/A] Classification: [2:Structures Medium] [N/A:N/A] Exudate Amount: [2:Serosanguineous] [N/A:N/A] Exudate Type: [2:red, brown] [N/A:N/A] Exudate Color: [2:Well defined, not attached] [N/A:N/A] Wound Margin: [2:Large (67-100%)] [N/A:N/A] Granulation Amount: [2:Pink] [N/A:N/A] Granulation Quality: [2:Small (1-33%)] [N/A:N/A] Necrotic Amount: [2:Fat Layer (Subcutaneous Tissue): Yes N/A] Exposed Structures: [2:Fascia: No Tendon: No Muscle: No Joint: No Bone: No Small (1-33%)] [N/A:N/A] Treatment Notes Wound #2 (Right, Medial Lower Leg) 1.  Cleanse With Wound Cleanser 3. Primary Dressing Applied Collegen AG 4. Secondary Dressing Foam Border Dressing Electronic Signature(s) Signed: 03/11/2020 5:56:33 PM By: Kela Millin Signed: 03/13/2020 7:39:18 AM By: Linton Ham MD Entered By: Linton Ham on 03/11/2020 09:19:58 -------------------------------------------------------------------------------- Multi-Disciplinary Care Plan Details Patient Name: Date of Service: [name], [place] [name]. 03/11/2020 8:15 A M Medical Record Number: [id number] Patient Account Number: [id number] Date of Birth/Sex: Treating RN: 1946-04-10 (74 y.o. [name] Primary Care Blessings Inglett: Wilfred Lacy Other Clinician: Referring Versia Mignogna: Treating Kassadi Presswood/Extender: Alvira Monday in Treatment: 4 Active Inactive Wound/Skin Impairment Nursing Diagnoses: Impaired tissue integrity Knowledge deficit related to ulceration/compromised skin integrity Goals: Patient/caregiver will verbalize understanding of skin care regimen Date Initiated: 02/08/2020 Target Resolution Date: 04/01/2020 Goal Status: Active Ulcer/skin breakdown will have a volume reduction of 30% by week 4 Date Initiated: 02/08/2020 Date Inactivated: 03/11/2020 Target Resolution Date: 03/11/2020 Goal Status: Met Interventions: Assess patient/caregiver ability to  obtain necessary supplies Assess patient/caregiver ability to perform ulcer/skin care regimen upon admission and as needed Assess ulceration(s) every visit Provide education on ulcer and skin care Notes: Electronic Signature(s) Signed: 03/11/2020 5:56:33 PM By: Kela Millin Entered By: Kela Millin on 03/11/2020 08:09:24 -------------------------------------------------------------------------------- Pain Assessment Details Patient Name: Date of Service: [name], [name] [name]. 03/11/2020 8:15 A M Medical Record Number: [id number] Patient Account Number: [id number] Date of Birth/Sex: Treating RN: 10-Sep-1945 (74 y.o. [name] Primary Care Diksha Tagliaferro: Wilfred Lacy Other Clinician: Referring Brycelyn Gambino: Treating Macyn Remmert/Extender: Alvira Monday in Treatment: 4 Active Problems Location of Pain Severity and Description of Pain Patient Has Paino No Site Locations Pain Management and Medication Current Pain Management: Electronic Signature(s) Signed: 03/11/2020 6:01:16 PM By: Levan Hurst RN, BSN Entered By: Levan Hurst on 03/11/2020 08:51:39 -------------------------------------------------------------------------------- Patient/Caregiver Education Details Patient Name: Date of Service: [name] [name]. 8/27/2021andnbsp8:15 A M Medical Record Number: [id number] Patient Account Number: [id number] Date of Birth/Gender: Treating RN: 04-21-1946 (74 y.o. [name] Primary Care Physician: Wilfred Lacy Other Clinician: Referring Physician: Treating Physician/Extender: Alvira Monday in Treatment: 4 Education Assessment Education Provided To: Patient Education Topics Provided Wound/Skin Impairment: Handouts: Caring for Your Ulcer Methods: Explain/Verbal Responses: State content correctly Electronic Signature(s) Signed: 03/11/2020 5:56:33 PM By: Kela Millin Entered By: Kela Millin on  03/11/2020 08:09:36 -------------------------------------------------------------------------------- Wound Assessment Details Patient Name: Date of Service: [name], [name] [name]. 03/11/2020 8:15 A M Medical Record Number: [id number] Patient Account Number: [id number] Date of Birth/Sex: Treating RN: January 11, 1946 (74 y.o. [name] Primary Care Mahnoor Mathisen: Wilfred Lacy Other Clinician: Referring Muaad Boehning: Treating Francesco Provencal/Extender: Alvira Monday in Treatment: 4 Wound Status Wound Number: 2 Primary Trauma, Other Etiology: Wound Location: Right, Medial Lower Leg Wound Open Wounding Event: Trauma Status: Date Acquired: 12/15/2019 Comorbid Cataracts, Anemia, Asthma, Arrhythmia, Congestive Heart Failure, Weeks Of Treatment: 4 History: Hypertension, End Stage Renal Disease, Gout Clustered Wound: No Wound Measurements Length: (cm) 1.2 Width: (cm) 1 Depth: (cm) 0.2 Area: (cm) 0.942 Volume: (cm) 0.188 % Reduction in Area: 9.2% % Reduction in Volume: 89.3% Epithelialization: Small (1-33%) Tunneling: No Undermining: No Wound Description Classification: Full Thickness With Exposed Support Structures Wound Margin: Well defined, not attached Exudate Amount: Medium Exudate Type: Serosanguineous Exudate Color: red, brown Foul Odor After Cleansing: No Slough/Fibrino Yes Wound Bed Granulation Amount: Large (67-100%) Exposed Structure Granulation Quality: Pink Fascia Exposed: No Necrotic Amount: Small (1-33%) Fat Layer (Subcutaneous Tissue) Exposed: Yes Necrotic Quality: Adherent Slough Tendon Exposed: No Muscle Exposed: No  Joint Exposed: No Bone Exposed: No Treatment Notes Wound #2 (Right, Medial Lower Leg) 1. Cleanse With Wound Cleanser 3. Primary Dressing Applied Collegen AG 4. Secondary Dressing Foam Border Dressing Electronic Signature(s) Signed: 03/11/2020 6:01:16 PM By: Levan Hurst RN, BSN Entered By: Levan Hurst on 03/11/2020  08:51:57 -------------------------------------------------------------------------------- [place] Details Patient Name: Date of Service: [name], [name] [name]. 03/11/2020 8:15 A M Medical Record Number: [id number] Patient Account Number: [id number] Date of Birth/Sex: Treating RN: 1946/05/26 (74 y.o. [name] Primary Care : Wilfred Lacy Other Clinician: Referring : Treating /Extender: Alvira Monday in Treatment: 4 Vital Signs Time Taken: 08:26 Temperature (F): 98.7 Height (in): 65 Pulse (bpm): 76 Weight (lbs): 142 Respiratory Rate (breaths/min): 24 Body Mass Index (BMI): 23.6 Blood Pressure (mmHg): 122/72 Reference Range: 80 - 120 mg / dl Notes patient present to clinic altered mental status, short of breath, productive cough, oxygen saturation 69% initially, increased to 82% after several deep breaths, Dr. Dellia Nims notified, pt placed on 2 liters of oxygen, O2 sat increased to 94%, per Dr. Dellia Nims pt has crackles and diminished lung sounds, recommend patient go to ER for evaluation. Electronic Signature(s) Signed: 03/11/2020 6:01:16 PM By: Levan Hurst RN, BSN Entered By: Levan Hurst on 03/11/2020 08:51:35

## 2020-03-13 NOTE — Progress Notes (Addendum)
SATURATION QUALIFICATIONS: (This note is used to comply with regulatory documentation for home oxygen)  Patient Saturations on Room Air at Rest = 89%  Patient Saturations on Room Air while Ambulating = 78%  Patient Saturations on 2 Liters of oxygen while Ambulating = 92%  Please briefly explain why patient needs home oxygen: patient is now requiring only 2 L nasal cannula with exertion.

## 2020-03-13 NOTE — Progress Notes (Deleted)
Physician Discharge Summary  HYDEE FLEECE HER:[id number] DOB: 02-Jan-1946 DOA: 03/11/2020  PCP: [name], NP  Admit date: 03/11/2020 Discharge date: 03/13/2020  Admitted From: Home Disposition:  Home  Recommendations for Outpatient Follow-up:  1. Follow up with PCP in 1-2 weeks 2. Please obtain BMP/CBC in one week 3. Please follow up on the following pending results:  Equipment/Devices: Oxygen 2L [place]  Discharge Condition: Stable CODE STATUS: Full Diet recommendation: Renal diet as tolerated  Brief/Interim Summary: Margret Moat Lucasis a 74 y.o.femalewith medical history significant ofend-stage renal disease status post renal transplant 2017 currently on chronic immunosuppression, atrial fibrillation, pacemaker placement, CKD who initially presented to her primary care provider office on 03/10/2020 with complaints of 2-week history of increased congestion,weakness and shortness of breath. Patient was noted to be mildly hypoxemic with O2 sat of 94% on room air in the office. Patient was thought to have acute heart failure and was thus referred to CHF clinic. On the day of hospital visit, patient went to wound care appointment regarding a wound on her right leg and was there found to be hypoxemic with O2 saturation into the 60s on ambulation. Patient was therefore referred to the emergency department for further work-up. Of note, pt's husband has been having URI symptoms as well.In the emergency department, patient was noted to have chest x-ray findings suggestive of bilateral infiltrates. Mildly tachypneic with respiratory rate in excess of 30 breaths/min with O2 saturation in the low 90s while at rest on room air. She was also noted to have a potassium 6.2 with a creatinine of 2.8. Nephrology was consulted regarding elevated potassium and patient did later received IV insulin with IV dextrose with IV bicarb.Covid test later returned positive for COVID-19. Hospitalist service  consulted for consideration for medical admission.  Patient admitted as above with worsening fatigue weakness and mild transient hypoxia in the setting of COVID-19 pneumonia.  Patient is vaccinated but high risk due to her immunocompromise state chronic prednisone and advanced age and comorbid's.  Patient's symptoms and hypoxia improved drastically over the first 24 hours, patient is now able to ambulate without moderate hypoxia, requiring only 2 L nasal cannula with exertion.  Patient's clinical course likely rapidly improved in the setting of previous vaccination.  At this time patient is otherwise stable and agreeable for discharge home, mild AKI noted at intake, nephrology was consulted for evaluation in the setting of renal transplant with IV fluid recommendations and increased p.o. free water intake.  Creatinine is now resolved essentially back to baseline.  Will need close follow-up in the outpatient setting with PCP as well as nephrology as previously scheduled.  She will be continued on the remainder of her steroid taper as well as she requires 2 additional doses of Remdesivir which have been scheduled for Monday and Tuesday per paperwork.  Patient otherwise stable and agreeable for discharge home.  Discharge Diagnoses:  Principal Problem:   Pneumonia due to COVID-19 virus Active Problems:   Essential hypertension   CVA (cerebrovascular accident due to intracerebral hemorrhage) (HCC)   End stage renal disease ([place])   Renal transplant recipient   Chronic respiratory failure with hypoxia (HCC)   Hyperkalemia   Personal history of PE (pulmonary embolism)   Immunosuppression ([place])   Cardiac pacemaker in situ   SOB (shortness of breath)    Discharge Instructions  Discharge Instructions    Discharge instructions   Complete by: As directed    ?   Person Under Monitoring Name: Florina  Arta Silence  Location: [address] [place] [address]   Infection Prevention Recommendations for  Individuals Confirmed to have, or Being Evaluated for, 2019 Novel Coronavirus (COVID-19) Infection Who Receive Care at Home  Individuals who are confirmed to have, or are being evaluated for, COVID-19 should follow the prevention steps below until a healthcare provider or local or state health department says they can return to normal activities.  Stay home except to get medical care You should restrict activities outside your home, except for getting medical care. Do not go to work, school, or public areas, and do not use public transportation or taxis.  Call ahead before visiting your doctor Before your medical appointment, call the healthcare provider and tell them that you have, or are being evaluated for, COVID-19 infection. This will help the healthcare provider's office take steps to keep other people from getting infected. Ask your healthcare provider to call the local or state health department.  Monitor your symptoms Seek prompt medical attention if your illness is worsening (e.g., difficulty breathing). Before going to your medical appointment, call the healthcare provider and tell them that you have, or are being evaluated for, COVID-19 infection. Ask your healthcare provider to call the local or state health department.  Wear a facemask You should wear a facemask that covers your nose and mouth when you are in the same room with other people and when you visit a healthcare provider. People who live with or visit you should also wear a facemask while they are in the same room with you.  Separate yourself from other people in your home As much as possible, you should stay in a different room from other people in your home. Also, you should use a separate bathroom, if available.  Avoid sharing household items You should not share dishes, drinking glasses, cups, eating utensils, towels, bedding, or other items with other people in your home. After using these items, you  should wash them thoroughly with soap and water.  Cover your coughs and sneezes Cover your mouth and nose with a tissue when you cough or sneeze, or you can cough or sneeze into your sleeve. Throw used tissues in a lined trash can, and immediately wash your hands with soap and water for at least 20 seconds or use an alcohol-based hand rub.  Wash your [facility] your hands often and thoroughly with soap and water for at least 20 seconds. You can use an alcohol-based hand sanitizer if soap and water are not available and if your hands are not visibly dirty. Avoid touching your eyes, nose, and mouth with unwashed hands.   Prevention Steps for Caregivers and Household Members of Individuals Confirmed to have, or Being Evaluated for, COVID-19 Infection Being Cared for in the Home  If you live with, or provide care at home for, a person confirmed to have, or being evaluated for, COVID-19 infection please follow these guidelines to prevent infection:  Follow healthcare provider's instructions Make sure that you understand and can help the patient follow any healthcare provider instructions for all care.  Provide for the patient's basic needs You should help the patient with basic needs in the home and provide support for getting groceries, prescriptions, and other personal needs.  Monitor the patient's symptoms If they are getting sicker, call his or her medical provider and tell them that the patient has, or is being evaluated for, COVID-19 infection. This will help the healthcare provider's office take steps to keep other  people from getting infected. Ask the healthcare provider to call the local or state health department.  Limit the number of people who have contact with the patient If possible, have only one caregiver for the patient. Other household members should stay in another home or place of residence. If this is not possible, they should stay in another room, or be separated  from the patient as much as possible. Use a separate bathroom, if available. Restrict visitors who do not have an essential need to be in the home.  Keep older adults, very young children, and other sick people away from the patient Keep older adults, very young children, and those who have compromised immune systems or chronic health conditions away from the patient. This includes people with chronic heart, lung, or kidney conditions, diabetes, and cancer.  Ensure good ventilation Make sure that shared spaces in the home have good air flow, such as from an air conditioner or an opened window, weather permitting.  Wash your hands often Wash your hands often and thoroughly with soap and water for at least 20 seconds. You can use an alcohol based hand sanitizer if soap and water are not available and if your hands are not visibly dirty. Avoid touching your eyes, nose, and mouth with unwashed hands. Use disposable paper towels to dry your hands. If not available, use dedicated cloth towels and replace them when they become wet.  Wear a facemask and gloves Wear a disposable facemask at all times in the room and gloves when you touch or have contact with the patient's blood, body fluids, and/or secretions or excretions, such as sweat, saliva, sputum, nasal mucus, vomit, urine, or feces.  Ensure the mask fits over your nose and mouth tightly, and do not touch it during use. Throw out disposable facemasks and gloves after using them. Do not reuse. Wash your hands immediately after removing your facemask and gloves. If your personal clothing becomes contaminated, carefully remove clothing and launder. Wash your hands after handling contaminated clothing. Place all used disposable facemasks, gloves, and other waste in a lined container before disposing them with other household waste. Remove gloves and wash your hands immediately after handling these items.  Do not share dishes, glasses, or other  household items with the patient Avoid sharing household items. You should not share dishes, drinking glasses, cups, eating utensils, towels, bedding, or other items with a patient who is confirmed to have, or being evaluated for, COVID-19 infection. After the person uses these items, you should wash them thoroughly with soap and water.  Wash laundry thoroughly Immediately remove and wash clothes or bedding that have blood, body fluids, and/or secretions or excretions, such as sweat, saliva, sputum, nasal mucus, vomit, urine, or feces, on them. Wear gloves when handling laundry from the patient. Read and follow directions on labels of laundry or clothing items and detergent. In general, wash and dry with the warmest temperatures recommended on the label.  Clean all areas the individual has used often Clean all touchable surfaces, such as counters, tabletops, doorknobs, bathroom fixtures, toilets, phones, keyboards, tablets, and bedside tables, every day. Also, clean any surfaces that may have blood, body fluids, and/or secretions or excretions on them. Wear gloves when cleaning surfaces the patient has come in contact with. Use a diluted bleach solution (e.g., dilute bleach with 1 part bleach and 10 parts water) or a household disinfectant with a label that says EPA-registered for coronaviruses. To make a bleach solution at home, add 1  tablespoon of bleach to 1 quart (4 cups) of water. For a larger supply, add  cup of bleach to 1 gallon (16 cups) of water. Read labels of cleaning products and follow recommendations provided on product labels. Labels contain instructions for safe and effective use of the cleaning product including precautions you should take when applying the product, such as wearing gloves or eye protection and making sure you have good ventilation during use of the product. Remove gloves and wash hands immediately after cleaning.  Monitor yourself for signs and symptoms of  illness Caregivers and household members are considered close contacts, should monitor their health, and will be asked to limit movement outside of the home to the extent possible. Follow the monitoring steps for close contacts listed on the symptom monitoring form.   ? If you have additional questions, contact your local health department or call the epidemiologist on call at [phone] (available 24/7). ? This guidance is subject to change. 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   Ventolin HFA 108 (90 Base) MCG/ACT inhaler Generic drug: albuterol Inhale 2 puffs into the lungs every 4 (four) hours as needed for wheezing or shortness of breath.   albuterol (2.5 MG/3ML) 0.083% nebulizer solution Commonly known as: PROVENTIL Take 3 mLs (2.5 mg total) by nebulization every 6 (six) hours as needed for wheezing or shortness of breath. Dx:J45.909   budesonide-formoterol 160-4.5 MCG/ACT inhaler Commonly known as: Symbicort Inhale 2 puffs into the lungs 2 (two) times daily.   calcitRIOL 0.25 MCG capsule Commonly known as: ROCALTROL Take 0.25 mcg by mouth every other  day.   carvedilol 12.5 MG tablet Commonly known as: COREG Take 2 tablets (25 mg total) by mouth 2 (two) times daily.   docusate sodium 100 MG capsule Commonly known as: COLACE Take 100 mg by mouth daily.   DULoxetine 30 MG capsule Commonly known as: CYMBALTA TAKE 1 CAPSULE BY MOUTH EVERY DAY What changed: how much to take   furosemide 80 MG tablet Commonly known as: LASIX Take 1 tablet (80 mg total) by mouth 2 (two) times daily. What changed: how much to take   IRON PO Take 325 mg by mouth daily.   magnesium oxide 400 MG tablet Commonly known as: MAG-OX Take 2 tablets (800 mg total) by mouth 2 (two) times daily. What changed: how much to take   Myfortic 180 MG EC tablet Generic drug: mycophenolate Take 180 mg by mouth 2 (two) times daily.   ondansetron 4 MG tablet Commonly known as: ZOFRAN Take 1 tablet (4 mg total) by mouth every  6 (six) hours as needed for nausea.   potassium chloride SA 20 MEQ tablet Commonly known as: KLOR-CON Take 20 mEq by mouth daily.   predniSONE 10 MG tablet Commonly known as: DELTASONE Take 4 tablets (40 mg total) by mouth daily for 3 days, THEN 3 tablets (30 mg total) daily for 3 days, THEN 2 tablets (20 mg total) daily for 3 days, THEN 1 tablet (10 mg total) daily for 3 days. Start taking on: March 13, 2020 What changed: You were already taking a medication with the same name, and this prescription was added. Make sure you understand how and when to take each.   predniSONE 5 MG tablet Commonly known as: DELTASONE Take 1 tablet (5 mg total) by mouth daily. Start taking on: March 25, 2020 What changed: These instructions start on March 25, 2020. If you are unsure what to do until then, ask your doctor or other care provider.   tacrolimus 1 MG capsule Commonly known as: PROGRAF Take 2 mg by mouth 2 (two) times daily.            Durable Medical Equipment  (From admission, onward)         Start     Ordered   03/13/20  1358  DME Oxygen  Once       Question Answer Comment  Length of Need 6 Months   Mode or (Route) Nasal cannula   Liters per Minute 2   Oxygen conserving device No   Oxygen delivery system Gas      03/13/20 1357          Allergies  Allergen Reactions  . Ace Inhibitors Other (See Comments)    Reaction unknown  . 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 Warfarin Other (See Comments)    Left side brain hemorrhage  . Warfarin Sodium Other (See Comments)    Caused her to have a stroke    Consultations:  Nephrology   Procedures/Studies: DG Chest 2 View  Result Date: 03/11/2020 CLINICAL DATA:  Shortness of breath. EXAM: CHEST - 2 VIEW COMPARISON:  03/10/2020 FINDINGS: 0946 hours. The cardio pericardial silhouette is enlarged. Interstitial markings are diffusely coarsened with chronic features. There is pulmonary vascular congestion without overt pulmonary edema. Bibasilar airspace disease is stable to minimally progressive. No pleural effusion. Status post left atrial appendage occlusion. Single lead pacer remains in place. Telemetry leads overlie the chest. Lap band noted left upper quadrant and IVC filter overlies the upper lumbar spine. IMPRESSION: Cardiomegaly with hyperinflation and chronic interstitial changes. Patchy airspace disease at the bases, stable to minimally progressed. Electronically Signed   By: Misty Stanley M.D.   On: 03/11/2020 09:59   DG Chest 2 View  Result Date: 03/10/2020 CLINICAL DATA:  Shortness of breath. EXAM: CHEST - 2 VIEW COMPARISON:  02/27/2020. FINDINGS: Cardiac pacer with lead tip over the right ventricle. Postsurgical changes noted of chest with occlusion device and coils noted over the left chest. Chronic interstitial changes again noted. Active right base infiltrate cannot be excluded. No pleural effusion or pneumothorax. Lap band noted stable position. IMPRESSION: 1. Cardiac pacer in  stable position. Stable postsurgical changes noted the chest. Stable cardiomegaly. 2. Chronic interstitial lung disease. Superimposed acute infiltrate right lung base cannot be excluded. Electronically Signed   By: Marcello Moores  Register   On: 03/10/2020 12:24   US Renal Transplant w/Doppler  Result Date: 03/11/2020 CLINICAL DATA:  Acute renal failure EXAM: ULTRASOUND OF RENAL TRANSPLANT WITH RENAL DOPPLER ULTRASOUND TECHNIQUE: Ultrasound examination of the renal transplant was performed with gray-scale, color and duplex doppler evaluation. COMPARISON:  February 03, 2018 FINDINGS: Transplant kidney location: RLQ Transplant Kidney: Renal measurements: 11.9 cm x 4.6 cm x 4.7 cm = volume: 128.56mL. Normal in size and parenchymal echogenicity. No evidence of mass or hydronephrosis. No peri-transplant fluid collection seen. Color flow in the main renal artery:  Yes Color flow in the main renal vein:  Yes Duplex Doppler Evaluation: Main Renal Artery Velocity: 22 cm/sec Main Renal Artery Resistive Index: N/A Venous waveform in main renal vein:  present Intrarenal resistive index in upper pole:  0.51 (normal 0.6-0.8; equivocal 0.8-0.9; abnormal >= 0.9) Intrarenal resistive index in lower pole: 1.0 (normal 0.6-0.8; equivocal 0.8-0.9; abnormal >= 0.9) Bladder: Normal for degree of bladder distention. Other findings:  None. IMPRESSION: 1. Normal appearance of the right lower quadrant renal transplant without evidence of hydronephrosis. 2. Elevated lower pole intrarenal resistive index. Electronically Signed   By: Virgina Norfolk M.D.   On: 03/11/2020 18:50   MM 3D SCREEN BREAST BILATERAL  Result Date: 03/08/2020 CLINICAL DATA:  Screening. EXAM: DIGITAL SCREENING BILATERAL MAMMOGRAM WITH TOMO AND CAD COMPARISON:  Previous exam(s). ACR Breast Density Category b: There are scattered areas of fibroglandular density. FINDINGS: There are no findings suspicious for malignancy. Images were processed with CAD. IMPRESSION: No  mammographic evidence of malignancy. A result letter of this screening mammogram will be mailed directly to the patient. RECOMMENDATION: Screening mammogram in one year. (Code:SM-B-01Y) BI-RADS CATEGORY  1: Negative. Electronically Signed   By: Dorise Bullion III M.D   On: 03/08/2020 17:00     Subjective: No acute issues or events overnight, patient's dyspnea fatigue and weakness has resolved, minimal hypoxia on exertion but patient remains without symptoms.  Otherwise denies nausea, vomiting, diarrhea, constipation, headache, fevers, chills.   Discharge Exam: Vitals:   03/13/20 0449 03/13/20 1206  BP: (!) 140/93 (!) 145/84  Pulse: 69 70  Resp: 18   Temp: 98.4 F (36.9 C)   SpO2: 100% 94%   Vitals:   03/12/20 1420 03/12/20 2032 03/13/20 0449 03/13/20 1206  BP: 136/80 (!) 142/97 (!) 140/93 (!) 145/84  Pulse: 70 70 69 70  Resp: 16 16 18    Temp: (!) 97.5 F (36.4 C) 97.7 F (36.5 C) 98.4 F (36.9 C)   TempSrc: Oral Oral Oral   SpO2: 97% 98% 100% 94%  Weight:      Height:        General: Pt is alert, awake, not in acute distress Cardiovascular: RRR, S1/S2 +, no rubs, no gallops Respiratory: CTA bilaterally, no wheezing, no rhonchi Abdominal: Soft, NT, ND, bowel sounds + Extremities: no edema, no cyanosis    The results of significant diagnostics from this hospitalization (including imaging, microbiology, ancillary and laboratory) are listed below for reference.     Microbiology: Recent Results (from the past 240 hour(s))  SARS Coronavirus 2 by RT PCR (hospital order, performed in [facility] hospital lab) Nasopharyngeal Nasopharyngeal Swab     Status: Abnormal   Collection Time: 03/11/20 10:44 AM   Specimen: Nasopharyngeal Swab  Result Value Ref Range Status   SARS Coronavirus 2 POSITIVE (A) NEGATIVE Final    Comment: CRITICAL RESULT CALLED TO, READ BACK BY AND VERIFIED WITH: BALDWIN,L. RN @1343  03/11/20 BILLINGSLEY,L (NOTE) SARS-CoV-2 target nucleic acids are  DETECTED  SARS-CoV-2 RNA is generally detectable in upper respiratory specimens  during the acute  phase of infection.  Positive results are indicative  of the presence of the identified virus, but do not rule out bacterial infection or co-infection with other pathogens not detected by the test.  Clinical correlation with patient history and  other diagnostic information is necessary to determine patient infection status.  The expected result is negative.  Fact Sheet for Patients:   StrictlyIdeas.no   Fact Sheet for Healthcare Providers:   BankingDealers.co.za    This test is not yet approved or cleared by the [place] FDA and  has been authorized for detection and/or diagnosis of SARS-CoV-2 by FDA under an Emergency Use Authorization (EUA).  This EUA will remain in effect  (meaning this test can be used) for the duration of  the COVID-19 declaration under Section 564(b)(1) of the Act, 21 U.S.C. section 360-bbb-3(b)(1), unless the authorization is terminated or revoked sooner.  Performed at [facility], [place] [address], [place], [place] [address]      Labs: BNP (last 3 results) Recent Labs    10/22/19 1100 03/11/20 1029  BNP 970.7* 025.4*   Basic Metabolic Panel: Recent Labs  Lab 03/11/20 1030 03/11/20 1639 03/11/20 1642 03/12/20 0922 03/13/20 1118  NA 137  --   --  140 138  K 6.2*  --   --  5.4* 4.6  CL 97*  --   --  100 99  CO2 29  --   --  28 28  GLUCOSE 140* 149*  --  116* 141*  BUN 72*  --   --  65* 77*  CREATININE 2.81*  --  2.78* 2.35* 2.31*  CALCIUM 9.3  --   --  9.0 8.8*  PHOS  --   --   --  4.5 4.5   Liver Function Tests: Recent Labs  Lab 03/11/20 1030 03/12/20 0922 03/13/20 1118  AST 36  --   --   ALT 23  --   --   ALKPHOS 96  --   --   BILITOT 0.9  --   --   PROT 6.9  --   --   ALBUMIN 3.6 3.0* 3.1*   No results for input(s): LIPASE, AMYLASE in the last 168 hours. No  results for input(s): AMMONIA in the last 168 hours. CBC: Recent Labs  Lab 03/11/20 1029 03/11/20 1642 03/12/20 0922 03/13/20 1118  WBC 8.3 6.6 3.6* 4.1  HGB 12.3 11.7* 13.1 13.1  HCT 40.2 38.8 42.5 42.1  MCV 82.7 83.6 82.2 79.9*  PLT 217 203 234 269   Cardiac Enzymes: No results for input(s): CKTOTAL, CKMB, CKMBINDEX, TROPONINI in the last 168 hours. BNP: Invalid input(s): POCBNP CBG: Recent Labs  Lab 03/11/20 1625  GLUCAP 138*   D-Dimer Recent Labs    03/12/20 0424 03/13/20 1118  DDIMER 1.35* 0.98*   Hgb A1c No results for input(s): HGBA1C in the last 72 hours. Lipid Profile No results for input(s): CHOL, HDL, LDLCALC, TRIG, CHOLHDL, LDLDIRECT in the last 72 hours. Thyroid function studies No results for input(s): TSH, T4TOTAL, T3FREE, THYROIDAB in the last 72 hours.  Invalid input(s): FREET3 Anemia work up Recent Labs    03/12/20 0424 03/13/20 1118  FERRITIN 391* 352*   Urinalysis    Component Value Date/Time   COLORURINE AMBER (A) 02/04/2018 1540   APPEARANCEUR CLOUDY (A) 02/04/2018 1540   LABSPEC 1.017 02/04/2018 1540   PHURINE 5.0 02/04/2018 1540   GLUCOSEU NEGATIVE 02/04/2018 1540   HGBUR LARGE (A) 02/04/2018 1540   HGBUR  negative 10/04/2009 1044   BILIRUBINUR NEGATIVE 02/04/2018 1540   BILIRUBINUR negative 12/13/2017 1508   KETONESUR NEGATIVE 02/04/2018 1540   PROTEINUR 100 (A) 02/04/2018 1540   UROBILINOGEN 0.2 12/13/2017 1508   UROBILINOGEN 0.2 03/25/2012 1354   NITRITE NEGATIVE 02/04/2018 1540   LEUKOCYTESUR MODERATE (A) 02/04/2018 1540   Sepsis Labs Invalid input(s): PROCALCITONIN,  WBC,  LACTICIDVEN Microbiology Recent Results (from the past 240 hour(s))  SARS Coronavirus 2 by RT PCR (hospital order, performed in [place] hospital lab) Nasopharyngeal Nasopharyngeal Swab     Status: Abnormal   Collection Time: 03/11/20 10:44 AM   Specimen: Nasopharyngeal Swab  Result Value Ref Range Status   SARS Coronavirus 2 POSITIVE (A)  NEGATIVE Final    Comment: CRITICAL RESULT CALLED TO, READ BACK BY AND VERIFIED WITH: BALDWIN,L. RN @1343  03/11/20 BILLINGSLEY,L (NOTE) SARS-CoV-2 target nucleic acids are DETECTED  SARS-CoV-2 RNA is generally detectable in upper respiratory specimens  during the acute phase of infection.  Positive results are indicative  of the presence of the identified virus, but do not rule out bacterial infection or co-infection with other pathogens not detected by the test.  Clinical correlation with patient history and  other diagnostic information is necessary to determine patient infection status.  The expected result is negative.  Fact Sheet for Patients:   StrictlyIdeas.no   Fact Sheet for Healthcare Providers:   BankingDealers.co.za    This test is not yet approved or cleared by the [place] FDA and  has been authorized for detection and/or diagnosis of SARS-CoV-2 by FDA under an Emergency Use Authorization (EUA).  This EUA will remain in effect  (meaning this test can be used) for the duration of  the COVID-19 declaration under Section 564(b)(1) of the Act, 21 U.S.C. section 360-bbb-3(b)(1), unless the authorization is terminated or revoked sooner.  Performed at [facility], [place] [address] [place], [place] [address]      Time coordinating discharge: Over 30 minutes  SIGNED:   Little Ishikawa, DO [facility] 03/13/2020, 2:01 PM Pager   If 7PM-7AM, please contact night-coverage [email or web address]

## 2020-03-13 NOTE — Progress Notes (Signed)
RONESHIA, DREW T ([id number]) . Visit Report for 03/11/2020 HPI Details Patient Name: Date of Service: [name] NDA T. 03/11/2020 8:15 A M Medical Record Number: [id number] Patient Account Number: [id number] Date of Birth/Sex: Treating RN: 15-Nov-1945 (74 y.o. Clearnce Sorrel Primary Care Provider: Wilfred Lacy Other Clinician: Referring Provider: Treating Provider/Extender: Alvira Monday in Treatment: 4 History of Present Illness HPI Description: 02/08/2020; this is a 74 year old woman who hit her leg when frozen chicken that fell out of the freezer. By her description she had a classic swelling without opening and compatible with a hematoma. She developed tissue skin necrosis over the hematoma had drainage out of it. Nevertheless I do not think she was aggressive in getting medical attention. In any case she eventually saw her primary doctor. She had 10 weight 10 days of Keflex which did not help. She again saw her primary physician on 7/15. She was referred here. She came in without anything on the wound. Past medical history includes asthma, depression, paroxysmal atrial fib status post AV nodal ablation and pacemaker, renal transplant, intracranial hemorrhage, cardiomyopathy congestive heart failure IVC filter.Marland Kitchen ABI in our clinic was 1.0 02/18/2020 upon evaluation today patient appears to be doing a little better in regard to her wound. This is a bit clearer obviously than last time. With that being said we did get approval for the snap VAC I think that is a good option for [name] to initiate today. 02/25/20-Patient back at 1 week, continuing with the snap VAC and 3 layer compression, which seems to be working although the dimensions of the same the surface of the wound looks really good 8/20; remarkable improvement since I last saw that she has been using the snap VAC for about 2 weeks. There is now no depth of this wound and I do not think the VAC is going to be  needed any further. 8/27; the patient's wound is still open healthy looking wound surface. We have been using silver collagen. The patient came into clinic today noted by our intake nurses to have altered mental status. O2 sat was recorded at 69% on room air. She seemed to do better with a mask and nonrebreather. She was seen by her primary doctor yesterday felt to have right lower lobe pneumonia and congestive heart failure. She was started on doxycycline. Her husband does not think she was Covid tested. She has a history of congestive heart failure Electronic Signature(s) Signed: 03/13/2020 7:39:18 AM By: Linton Ham MD Entered By: Linton Ham on 03/11/2020 09:21:12 -------------------------------------------------------------------------------- Physical Exam Details Patient Name: Date of Service: [name], [name] NDA T. 03/11/2020 8:15 A M Medical Record Number: [id number] Patient Account Number: [id number] Date of Birth/Sex: Treating RN: [birth year] (74 y.o. Clearnce Sorrel Primary Care Provider: Wilfred Lacy Other Clinician: Referring Provider: Treating Provider/Extender: Alvira Monday in Treatment: 4 Constitutional Sitting or standing Blood Pressure is within target range for patient.. Pulse regular and within target range for patient.Marland Kitchen Respirations regular, non-labored and within target range.. Temperature is normal and within the target range for the patient.Marland Kitchen Appears in no distress. Respiratory Above normal respiratory effort noted. Respiratory rate elveaated. Bilateral crackles to one half the way up. Cardiovascular Heart sounds distant. Notes Wound exam; left anterior leg in the setting of chronic venous insufficiency. The wound actually looks quite reasonable. Minimal depth. No evidence of surrounding infection. Electronic Signature(s) Signed: 03/13/2020 7:39:18 AM By: Linton Ham MD Entered By: Linton Ham on 03/11/2020  09:22:01 --------------------------------------------------------------------------------  Physician Orders Details Patient Name: Date of Service: [name] NDA T. 03/11/2020 8:15 A M Medical Record Number: [id number] Patient Account Number: [id number] Date of Birth/Sex: Treating RN: July 30, 1945 (74 y.o. 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 Skin Barriers/Peri-Wound Care Skin Prep Moisturizing lotion Wound Cleansing May shower with protection. - use cast protector Primary Wound Dressing Wound #2 Right,Medial Lower Leg Silver Collagen - moisten with hydrogel Secondary Dressing Wound #2 Right,Medial Lower Leg Foam Border Edema Control Avoid standing for long periods of time Elevate legs to the level of the heart or above for 30 minutes daily and/or when sitting, a frequency of: - throughout the day Exercise regularly Electronic Signature(s) Signed: 03/11/2020 6:01:16 PM By: Levan Hurst RN, BSN Signed: 03/13/2020 7:39:18 AM By: Linton Ham MD Entered By: Levan Hurst on 03/11/2020 08:53:59 -------------------------------------------------------------------------------- Problem List Details Patient Name: Date of Service: [name], [name] NDA T. 03/11/2020 8:15 A M Medical Record Number: [id number] Patient Account Number: [id number] Date of Birth/Sex: Treating RN: 1945-09-18 (74 y.o. 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 03/11/2020 8:15 A M Medical Record Number: [id number] Patient Account Number: [id number] Date of Birth/Sex: Treating RN: 1945/09/07 (74 y.o. Clearnce Sorrel Primary Care Provider: Wilfred Lacy Other Clinician: Referring Provider: Treating Provider/Extender: Alvira Monday in Treatment: 4 Subjective History of Present Illness (HPI) 02/08/2020; this is a 74 year old woman who hit her leg when frozen chicken that fell out of the freezer. By her description she had a classic swelling without opening and compatible with a hematoma. She developed tissue skin necrosis over the hematoma had drainage out of it. Nevertheless I do not think she was aggressive in getting medical attention. In any case she eventually saw her primary doctor. She had 10 weight 10 days of Keflex which did not help. She again saw her primary physician on 7/15. She was referred here. She came in without anything on the wound. Past medical history includes asthma, depression, paroxysmal atrial fib status post AV nodal ablation and pacemaker, renal transplant, intracranial hemorrhage, cardiomyopathy congestive heart failure IVC filter.Marland Kitchen ABI in our clinic was 1.0 02/18/2020 upon evaluation  today patient appears to be doing a little better in regard to her wound. This is a bit clearer obviously than last time. With that being said we did get approval for the snap VAC I think that is a good option for [name] to initiate today. 02/25/20-Patient back at 1 week, continuing with the snap VAC and 3 layer compression, which seems to be working although  the dimensions of the same the surface of the wound looks really good 8/20; remarkable improvement since I last saw that she has been using the snap VAC for about 2 weeks. There is now no depth of this wound and I do not think the VAC is going to be needed any further. 8/27; the patient's wound is still open healthy looking wound surface. We have been using silver collagen. The patient came into clinic today noted by our intake nurses to have altered mental status. O2 sat was recorded at 69% on room air. She seemed to do better with a mask and nonrebreather. She was seen by her primary doctor yesterday felt to have right lower lobe pneumonia and congestive heart failure. She was started on doxycycline. Her husband does not think she was Covid tested. She has a history of congestive heart failure Objective Constitutional Sitting or standing Blood Pressure is within target range for patient.. Pulse regular and within target range for patient.Marland Kitchen Respirations regular, non-labored and within target range.. Temperature is normal and within the target range for the patient.Marland Kitchen Appears in no distress. Vitals Time Taken: 8:26 AM, Height: 65 in, Weight: 142 lbs, BMI: 23.6, T emperature: 98.7 F, Pulse: 76 bpm, Respiratory Rate: 24 breaths/min, Blood Pressure: 122/72 mmHg. General Notes: patient present to clinic altered mental status, short of breath, productive cough, oxygen saturation 69% initially, increased to 82% after several deep breaths, Dr. Dellia Nims notified, pt placed on 2 liters of oxygen, O2 sat increased to 94%, per Dr. Dellia Nims pt has crackles and  diminished lung sounds, recommend patient go to ER for evaluation. Respiratory Above normal respiratory effort noted. Respiratory rate elveaated. Bilateral crackles to one half the way up. Cardiovascular Heart sounds distant. General Notes: Wound exam; left anterior leg in the setting of chronic venous insufficiency. The wound actually looks quite reasonable. Minimal depth. No evidence of surrounding infection. Integumentary (Hair, Skin) Wound #2 status is Open. Original cause of wound was Trauma. The wound is located on the Right,Medial Lower Leg. The wound measures 1.2cm length x 1cm width x 0.2cm depth; 0.942cm^2 area and 0.188cm^3 volume. There is Fat Layer (Subcutaneous Tissue) exposed. There is no tunneling or undermining noted. There is a medium amount of serosanguineous drainage noted. The wound margin is well defined and not attached to the wound base. There is large (67-100%) pink granulation within the wound bed. There is a small (1-33%) amount of necrotic tissue within the wound bed including Adherent Slough. 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 Skin Barriers/Peri-Wound Care: Skin Prep Moisturizing lotion Wound Cleansing: May shower with protection. - use cast protector Primary Wound Dressing: Wound #2 Right,Medial Lower Leg: Silver Collagen - moisten with hydrogel Secondary Dressing: Wound #2 Right,Medial Lower Leg: Foam Border Edema Control: Avoid standing for long periods of time Elevate legs to the level of the heart or above for 30 minutes daily and/or when sitting, a frequency of: - throughout the day Exercise regularly 1. I felt this  patient needed to go to the emergency room 2. I cannot tell whether this is a pneumonitis/pneumonia or pulmonary edema. Her jugular venous pressure was not elevated which make me think that this is probably either pneumonia or pneumonitis. Cannot rule out  a Covid pneumonia. Electronic Signature(s) Signed: 03/13/2020 7:39:18 AM By: Linton Ham MD Entered By: Linton Ham on 03/11/2020 09:22:54 -------------------------------------------------------------------------------- SuperBill Details Patient Name: Date of Service: [name], [name] NDA T. 03/11/2020 Medical Record Number: [id number] Patient Account Number: [id number] Date of Birth/Sex: Treating RN: 06/09/1946 (74 y.o. 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

## 2020-03-13 NOTE — Progress Notes (Signed)
The patient is scheduled for a Remdesivir infusion on 8/30 and 8/31 at 3 pm. Have the patient come to [place], they will see a COVID infusion banner by the road.  Enter there and turn left. There are marked spaces for Infusion.  Call the number on the sign or [phone] and someone will come out and bring them inside.  If someone is driving them, have them come to the same area and call the number and someone will come outside to get them. Thank you!

## 2020-03-13 NOTE — Progress Notes (Signed)
[facility] Progress Note  Subjective: creat stable 2.3 today, pt w/o c/o's, wants to go home. DC possibly later today.   Vitals:   03/12/20 2032 03/13/20 0449 03/13/20 1206 03/13/20 1419  BP: (!) 142/97 (!) 140/93 (!) 145/84 (!) 142/94  Pulse: 70 69 70 71  Resp: $Remo'16 18  18  'ySTju$ Temp: 97.7 F (36.5 C) 98.4 F (36.9 C)  99 F (37.2 C)  TempSrc: Oral Oral    SpO2: 98% 100% 94% 100%  Weight:      Height:        Exam: Gen alert, frail , breathing easy  No jvd or bruits Chest mild rales bilat bases RRR no MRG Abd soft ntnd no mass or ascites +bs Ext no LE or UE edema Neuro is awake, alert, no asterixis  LUA AVF +bruit    Home meds:  - lasix 80 bid/ kdur 20 qd/ coreg 12.5 bid   - prograf 2 mg bid/ myfortic 180 bid/ pred 5 qd  - cymbalta 30/ symbicort 2 bid  - prn's/ vitamins/ supplements    CXR 8/27 - IMPRESSION:  Cardiomegaly with hyperinflation and chronic interstitial changes.  Patchy airspace disease at the bases, stable to minimally progressed.     Na 137  K 6.2  CO2 29  BUN 72  Cr 2.81  Ca 9.3  Alb 3.6  LFT's wnl'   Hb 12  WBC 8k  plt 217   COVID +   B/l creat from April - May 2021 = 2.1- 2.3, eGFR 24- 28 ml/min    Assessment/ Plan: 1. AoCKD 4 of renal transplant - b/l creat from April - May 2021 = 2.1- 2.3, eGFR 24- 28 ml/min. Admit creat 2.8 on admit > 2.2 yest and 2.3 today.  Close to baseline. Continues on her prograf /myfortic at home dosing.  For dc today. To get a prednisone taper then will resume her transplant prednisone 5 mg qd on Sept 10th after taper completed. Will get OP remdesivir on Monday and Tuesday to complete the course.  Will need close f/u w/ [facility], will schedule appt in 1 wk for labs. For dc today.  2. BP/volume - BP's normal, cont home coreg 12.5 bid 3. COVID PNA/ hypoxemia - sig CXR changes, suspect viral pna. Per primary team getting remdesivir and IV steroids.  4. Hyperkalemia - sp acute protocol meds in ED.  Cont lokelma  10 bid and have changed to renal (lowK) diet.  5. H/o CVA 6. H/o DVT 7. Atrial fib - not on anticoag, +coreg        Rob Pretty Weltman 03/13/2020, 3:10 PM   Recent Labs  Lab 03/12/20 0922 03/13/20 1118  K 5.4* 4.6  BUN 65* 77*  CREATININE 2.35* 2.31*  CALCIUM 9.0 8.8*  PHOS 4.5 4.5  HGB 13.1 13.1   Inpatient medications: . albuterol  10 mg Nebulization Once  . vitamin C  500 mg Oral Daily  . carvedilol  12.5 mg Oral BID  . dexamethasone (DECADRON) injection  6 mg Intravenous Q24H  . DULoxetine  30 mg Oral Daily  . enoxaparin (LOVENOX) injection  30 mg Subcutaneous Q24H  . mycophenolate  180 mg Oral BID  . tacrolimus  2 mg Oral BID  . zinc sulfate  220 mg Oral Daily   . remdesivir 100 mg in NS 100 mL 100 mg (03/13/20 0941)   acetaminophen **OR** acetaminophen, chlorpheniramine-HYDROcodone, guaiFENesin-dextromethorphan

## 2020-03-14 ENCOUNTER — Ambulatory Visit: Payer: Medicare Other | Admitting: Psychology

## 2020-03-14 ENCOUNTER — Telehealth: Payer: Self-pay | Admitting: Nurse Practitioner

## 2020-03-14 ENCOUNTER — Ambulatory Visit: Payer: Self-pay

## 2020-03-14 ENCOUNTER — Ambulatory Visit (HOSPITAL_COMMUNITY)
Admit: 2020-03-14 | Discharge: 2020-03-14 | Disposition: A | Discharge status: Home / Self Care | Payer: Medicare Other | Source: Ambulatory Visit | Attending: Pulmonary Disease | Admitting: Pulmonary Disease

## 2020-03-14 DIAGNOSIS — U071 COVID-19: Secondary | ICD-10-CM | POA: Insufficient documentation

## 2020-03-14 DIAGNOSIS — D500 Iron deficiency anemia secondary to blood loss (chronic): Secondary | ICD-10-CM | POA: Diagnosis not present

## 2020-03-14 DIAGNOSIS — J1282 Pneumonia due to coronavirus disease 2019: Secondary | ICD-10-CM | POA: Diagnosis not present

## 2020-03-14 DIAGNOSIS — I4819 Other persistent atrial fibrillation: Secondary | ICD-10-CM | POA: Diagnosis not present

## 2020-03-14 DIAGNOSIS — J9621 Acute and chronic respiratory failure with hypoxia: Secondary | ICD-10-CM | POA: Diagnosis not present

## 2020-03-14 DIAGNOSIS — N183 Chronic kidney disease, stage 3 unspecified: Secondary | ICD-10-CM | POA: Diagnosis not present

## 2020-03-14 DIAGNOSIS — I5032 Chronic diastolic (congestive) heart failure: Secondary | ICD-10-CM | POA: Diagnosis not present

## 2020-03-14 DIAGNOSIS — D631 Anemia in chronic kidney disease: Secondary | ICD-10-CM | POA: Diagnosis not present

## 2020-03-14 DIAGNOSIS — I130 Hypertensive heart and chronic kidney disease with heart failure and stage 1 through stage 4 chronic kidney disease, or unspecified chronic kidney disease: Secondary | ICD-10-CM | POA: Diagnosis not present

## 2020-03-14 MED ORDER — SODIUM CHLORIDE 0.9 % IV SOLN
100.0000 mg | Freq: Once | INTRAVENOUS | Status: AC
  Administered 2020-03-14: 100 mg via INTRAVENOUS

## 2020-03-14 MED ORDER — FAMOTIDINE IN NACL 20-0.9 MG/50ML-% IV SOLN: 20.0000 mg | Freq: Once | INTRAVENOUS | Status: DC | PRN

## 2020-03-14 MED ORDER — METHYLPREDNISOLONE SODIUM SUCC 125 MG IJ SOLR: 125.0000 mg | Freq: Once | INTRAMUSCULAR | Status: DC | PRN

## 2020-03-14 MED ORDER — EPINEPHRINE 0.3 MG/0.3ML IJ SOAJ: 0.3000 mg | Freq: Once | INTRAMUSCULAR | Status: DC | PRN

## 2020-03-14 MED ORDER — DIPHENHYDRAMINE HCL 50 MG/ML IJ SOLN: 50.0000 mg | Freq: Once | INTRAMUSCULAR | Status: DC | PRN

## 2020-03-14 MED ORDER — ALBUTEROL SULFATE HFA 108 (90 BASE) MCG/ACT IN AERS: 2.0000 | INHALATION_SPRAY | Freq: Once | RESPIRATORY_TRACT | Status: DC | PRN

## 2020-03-14 MED ORDER — SODIUM CHLORIDE 0.9 % IV SOLN: INTRAVENOUS | Status: DC | PRN

## 2020-03-14 NOTE — Discharge Instructions (Signed)
The CDC is recommending patients who receive monoclonal antibody treatments wait at least 90 days before being vaccinated.  Currently, there are no data on the safety and efficacy of mRNA COVID-19 vaccines in persons who received monoclonal antibodies or convalescent plasma as part of COVID-19 treatment. Based on the estimated half-life of such therapies as well as evidence suggesting that reinfection is uncommon in the 90 days after initial infection, vaccination should be deferred for at least 90 days, as a precautionary measure until additional information becomes available, to avoid interference of the antibody treatment with vaccine-induced immune responses.   10 Things You Can Do to Manage Your COVID-19 Symptoms at Home If you have possible or confirmed COVID-19: 1. Stay home from work and school. And stay away from other public places. If you must go out, avoid using any kind of public transportation, ridesharing, or taxis. 2. Monitor your symptoms carefully. If your symptoms get worse, call your healthcare provider immediately. 3. Get rest and stay hydrated. 4. If you have a medical appointment, call the healthcare provider ahead of time and tell them that you have or may have COVID-19. 5. For medical emergencies, call 911 and notify the dispatch personnel that you have or may have COVID-19. 6. Cover your cough and sneezes with a tissue or use the inside of your elbow. 7. Wash your hands often with soap and water for at least 20 seconds or clean your hands with an alcohol-based hand sanitizer that contains at least 60% alcohol. 8. As much as possible, stay in a specific room and away from other people in your home. Also, you should use a separate bathroom, if available. If you need to be around other people in or outside of the home, wear a mask. 9. Avoid sharing personal items with other people in your household, like dishes, towels, and bedding. 10. Clean all surfaces that are touched  often, like counters, tabletops, and doorknobs. Use household cleaning sprays or wipes according to the label instructions. [email or web address] 01/14/2019 This information is not intended to replace advice given to you by your health care provider. Make sure you discuss any questions you have with your health care provider. Document Revised: 06/18/2019 Document Reviewed: 06/18/2019 Elsevier Patient Education  [place].

## 2020-03-14 NOTE — Telephone Encounter (Signed)
Left voicemail giving the verbal to proceed with PT. Left a call back number

## 2020-03-14 NOTE — Progress Notes (Signed)
  Diagnosis: COVID-19  Physician: Dr. Joya Gaskins  Procedure: [facility] Infusion Clinic Med: remdesivir infusion - Provided patient with remdesivir fact sheet for patients, parents and caregivers prior to infusion.  Complications: No immediate complications noted.  Discharge: Discharged home   Tia Masker 03/14/2020

## 2020-03-14 NOTE — Telephone Encounter (Signed)
[name] is calling and wanted to see if she could get verbal orders for physical therapy for twice a week for 4 weeks and once a week for 2 weeks, please advise. CB is [phone]

## 2020-03-15 ENCOUNTER — Ambulatory Visit (HOSPITAL_COMMUNITY)
Admit: 2020-03-15 | Discharge: 2020-03-15 | Disposition: A | Discharge status: Home / Self Care | Payer: Medicare Other | Attending: Pulmonary Disease | Admitting: Pulmonary Disease

## 2020-03-15 ENCOUNTER — Other Ambulatory Visit: Payer: Self-pay

## 2020-03-15 DIAGNOSIS — U071 COVID-19: Secondary | ICD-10-CM | POA: Diagnosis not present

## 2020-03-15 DIAGNOSIS — J1282 Pneumonia due to coronavirus disease 2019: Secondary | ICD-10-CM | POA: Diagnosis not present

## 2020-03-15 MED ORDER — ALBUTEROL SULFATE HFA 108 (90 BASE) MCG/ACT IN AERS: 2.0000 | INHALATION_SPRAY | Freq: Once | RESPIRATORY_TRACT | Status: DC | PRN

## 2020-03-15 MED ORDER — DIPHENHYDRAMINE HCL 50 MG/ML IJ SOLN: 50.0000 mg | Freq: Once | INTRAMUSCULAR | Status: DC | PRN

## 2020-03-15 MED ORDER — EPINEPHRINE 0.3 MG/0.3ML IJ SOAJ: 0.3000 mg | Freq: Once | INTRAMUSCULAR | Status: DC | PRN

## 2020-03-15 MED ORDER — SODIUM CHLORIDE 0.9 % IV SOLN: INTRAVENOUS | Status: DC | PRN

## 2020-03-15 MED ORDER — SODIUM CHLORIDE 0.9 % IV SOLN
100.0000 mg | Freq: Once | INTRAVENOUS | Status: AC
  Administered 2020-03-15: 100 mg via INTRAVENOUS

## 2020-03-15 MED ORDER — FAMOTIDINE IN NACL 20-0.9 MG/50ML-% IV SOLN: 20.0000 mg | Freq: Once | INTRAVENOUS | Status: DC | PRN

## 2020-03-15 MED ORDER — METHYLPREDNISOLONE SODIUM SUCC 125 MG IJ SOLR: 125.0000 mg | Freq: Once | INTRAMUSCULAR | Status: DC | PRN

## 2020-03-15 NOTE — Progress Notes (Signed)
  Diagnosis: COVID-19  Physician:Dr Joya Gaskins  Procedure: [facility] Infusion Clinic Med: remdesivir infusion - Provided patient with remdesivir fact sheet for patients, parents and caregivers prior to infusion.  Complications: No immediate complications noted.  Discharge: Discharged home   [place], [place] 03/15/2020

## 2020-03-16 ENCOUNTER — Telehealth: Payer: Self-pay | Admitting: Nurse Practitioner

## 2020-03-16 NOTE — Telephone Encounter (Signed)
Ok to order 

## 2020-03-16 NOTE — Telephone Encounter (Signed)
[name] from [facility] called and said that she needs an order put in for a nurse visit for next week for them to go out and see mrs [name], Can you put that in. CB: [phone] Option 2

## 2020-03-17 ENCOUNTER — Telehealth (INDEPENDENT_AMBULATORY_CARE_PROVIDER_SITE_OTHER): Payer: Medicare Other | Admitting: Nurse Practitioner

## 2020-03-17 ENCOUNTER — Encounter: Payer: Self-pay | Admitting: Nurse Practitioner

## 2020-03-17 VITALS — Ht 65.0 in

## 2020-03-17 DIAGNOSIS — R0602 Shortness of breath: Secondary | ICD-10-CM

## 2020-03-17 DIAGNOSIS — D500 Iron deficiency anemia secondary to blood loss (chronic): Secondary | ICD-10-CM | POA: Diagnosis not present

## 2020-03-17 DIAGNOSIS — I4819 Other persistent atrial fibrillation: Secondary | ICD-10-CM | POA: Diagnosis not present

## 2020-03-17 DIAGNOSIS — N189 Chronic kidney disease, unspecified: Secondary | ICD-10-CM | POA: Diagnosis not present

## 2020-03-17 DIAGNOSIS — I130 Hypertensive heart and chronic kidney disease with heart failure and stage 1 through stage 4 chronic kidney disease, or unspecified chronic kidney disease: Secondary | ICD-10-CM | POA: Diagnosis not present

## 2020-03-17 DIAGNOSIS — U071 COVID-19: Secondary | ICD-10-CM | POA: Diagnosis not present

## 2020-03-17 DIAGNOSIS — N183 Chronic kidney disease, stage 3 unspecified: Secondary | ICD-10-CM | POA: Diagnosis not present

## 2020-03-17 DIAGNOSIS — J9621 Acute and chronic respiratory failure with hypoxia: Secondary | ICD-10-CM | POA: Diagnosis not present

## 2020-03-17 DIAGNOSIS — I5032 Chronic diastolic (congestive) heart failure: Secondary | ICD-10-CM | POA: Diagnosis not present

## 2020-03-17 DIAGNOSIS — J1282 Pneumonia due to coronavirus disease 2019: Secondary | ICD-10-CM | POA: Diagnosis not present

## 2020-03-17 DIAGNOSIS — N179 Acute kidney failure, unspecified: Secondary | ICD-10-CM

## 2020-03-17 DIAGNOSIS — D631 Anemia in chronic kidney disease: Secondary | ICD-10-CM | POA: Diagnosis not present

## 2020-03-17 LAB — TACROLIMUS LEVEL: Tacrolimus (FK506) - LabCorp: 59.7 ng/mL — ABNORMAL HIGH (ref 2.0–20.0)

## 2020-03-17 NOTE — Patient Outreach (Signed)
[facility]) Care Management  03/17/2020  [name] 17-Jan-1946 [id number]   New referral: Patient reassigned to this case manager.  Hospital discharge on 03/13/2020.   MD office does transition of care.  LATE ENTRY DOCUMENTATION FROM 03/15/2020   Placed call to patient and explained reason for call.  Patient reports that she continues to feel weak.  Reports she is attending infusions for COVID. Reports she has an infusion at 3pm today  ( 03/15/2020). Husband to drive.  Patient initially reported she has all her medications. As I attempted to review medication, it appeared that patient was very confused about her medications. Attempted to review at great length and patient was very frustrated.  Reviewed with patient that I would call her back after her infusion and come up with a plan to help her with her medication understanding.  Patient agreed that she needed to go to her appointment.  I spoke with assistant director about patient as [facility] is not doing home visits at this time.  Reviewed potential for referral to Remote Health.    03/16/2020  5:09pm Placed call back to patient in attempt to review medications again. Husband seemed upset.  Reviewed with patient and husband the work I was doing behind the scenes to attempt to get patient help with her medications management.  Offered to spend as long as need with both patient and husband on the phone reviewing each medication. Husband offered this time to help wife.  I encouraged husband to get all medications bottles and patient have her list.   After 1.5 hours on the phone I was able to review all medications and determine that patient and husband have a understanding of medications.  Patient finally started her prednisone that was ordered at discharge.  Started on 03/16/2020.  I was able to come to a conclusion that patient has all her medications and knows how to take. Encouraged patient to use the pill planner that she has to take  her medications to lessen the confusion.  Patient was missing Prednisone 5mg  to start after does pack.    6:30pm placed call to [facility] and spoke with Shanon Brow ( pharmacist) who reports that they do have the prescription and will fill when patient is ready for the new dose.   6:45pm  Placed call back to patient to explain that prescription is at the pharmacy and will be filled when patient requested.  Husband very appreciative of the time taken to review and understand each medication. Husband and patient deny needing any help with medications at this time. I will hold off on Remote Health referral and call patient back in 03/22/2020.  I did reviewed with husband and patient that appointment time is not always accurate depending on other patients needs and emergencies.  Reviewed with husband and patient to call me if needed earlier than planned appointment date and time.  I also reviewed pending PCP virtual visit on 03/17/2020.  Husband and patient voiced understanding.  PLAN:  Will follow up via phone on 03/22/2020.Provided my contact information including phone number and name.   Tomasa Rand, RN, BSN, CEN [facility] [employer] [phone]

## 2020-03-17 NOTE — Progress Notes (Signed)
Virtual Visit via Telephone Note  I connected with@ on 03/17/20 at  1:00 PM EDT by a telephone enabled telemedicine application and verified that I am speaking with the correct person using two identifiers.  Location: Patient:Home Provider: Office Participants: patient, husband and provider  I discussed the limitations of evaluation and management by telemedicine and the availability of in person appointments. I also discussed with the patient that there may be a patient responsible charge related to this service. The patient expressed understanding and agreed to proceed.  PQ:AESLPNPY f/up for pneumonia due to COVID infection.  History of Present Illness: Hospitalization: 08/27-08/29/2021 TCM call not completed Ms. Bohlken was admitted due to hypoxia and weakness. She had upper respiratory for 2weeks prior to COVID diagnosis. Her hospital stay was complicated by AKI on chronic CKD due to poor oral hydration. She was treated with IV fluids, remdersivir and steroids. Reviewed hospital discharge summary, lab results and radiology report. Today she is at home with husband who reports persistent poor oral intake. Ms. Lamere reports improved SOB but persistent fatigue. She denies any fever. She has resumed Home PT and OT. Lab appt with nephrology 04/04/2020 Completed remdesvir and solumedrol infusion 03/15/2020 by infusion clinic   Observations/Objective: Alert and oriented x4 Limited exam due to telephone call  Assessment and Plan: Diagnoses and all orders for this visit:  Pneumonia due to COVID-19 virus  SOB (shortness of breath)  Acute kidney injury superimposed on chronic kidney disease ([place])   Follow Up Instructions: Continue to maintain adequate oral hydration. Maintain small frequent meals Maintain appt with nephrology for repeat labs Repeat CXR in 4weeks F/up in 1week(video appt)   I discussed the assessment and treatment plan with the patient. The patient was provided an  opportunity to ask questions and all were answered. The patient agreed with the plan and demonstrated an understanding of the instructions.   The patient was advised to call back or seek an in-person evaluation if the symptoms worsen or if the condition fails to improve as anticipated.  I provided 25 minutes of non-face-to-face time during this encounter.  Wilfred Lacy, NP

## 2020-03-17 NOTE — Patient Instructions (Signed)
Continue to maintain adequate oral hydration. Maintain small frequent meals Maintain appt with nephrology for repeat labs Repeat CXR in 4weeks F/up in 1week(video appt)

## 2020-03-22 ENCOUNTER — Other Ambulatory Visit: Payer: Self-pay

## 2020-03-22 DIAGNOSIS — J1282 Pneumonia due to coronavirus disease 2019: Secondary | ICD-10-CM | POA: Diagnosis not present

## 2020-03-22 DIAGNOSIS — J9621 Acute and chronic respiratory failure with hypoxia: Secondary | ICD-10-CM | POA: Diagnosis not present

## 2020-03-22 DIAGNOSIS — I4819 Other persistent atrial fibrillation: Secondary | ICD-10-CM | POA: Diagnosis not present

## 2020-03-22 DIAGNOSIS — I130 Hypertensive heart and chronic kidney disease with heart failure and stage 1 through stage 4 chronic kidney disease, or unspecified chronic kidney disease: Secondary | ICD-10-CM | POA: Diagnosis not present

## 2020-03-22 DIAGNOSIS — U071 COVID-19: Secondary | ICD-10-CM | POA: Diagnosis not present

## 2020-03-22 DIAGNOSIS — N183 Chronic kidney disease, stage 3 unspecified: Secondary | ICD-10-CM | POA: Diagnosis not present

## 2020-03-22 DIAGNOSIS — D500 Iron deficiency anemia secondary to blood loss (chronic): Secondary | ICD-10-CM | POA: Diagnosis not present

## 2020-03-22 DIAGNOSIS — D631 Anemia in chronic kidney disease: Secondary | ICD-10-CM | POA: Diagnosis not present

## 2020-03-22 DIAGNOSIS — I5032 Chronic diastolic (congestive) heart failure: Secondary | ICD-10-CM | POA: Diagnosis not present

## 2020-03-22 NOTE — Telephone Encounter (Signed)
[place], [name] was not available. I left a message with [facility] requesting a call back.   Do we need to enter a referral for [place]? Or Does she need a verbal order from the provider/CMA for a skilled nursing visit?

## 2020-03-22 NOTE — Patient Outreach (Addendum)
[facility]) Care Management  03/22/2020  [name] 1946/06/09 [id number]   Telephone assessment:   Placed call to patient who reports she is doing better. Reports she still has a cough. Denies fever.  Reports she is taking her medications as prescribed.  Reports she was able to make her own breakfast this morning. PT and OT coming tomorrow.  Reports she has a telephone call with MD. Denies any new problems or concerns today.  PLAN: follow up in 1 week.  Tomasa Rand, RN, BSN, CEN [facility] [employer] [phone]

## 2020-03-23 DIAGNOSIS — D500 Iron deficiency anemia secondary to blood loss (chronic): Secondary | ICD-10-CM | POA: Diagnosis not present

## 2020-03-23 DIAGNOSIS — I4819 Other persistent atrial fibrillation: Secondary | ICD-10-CM | POA: Diagnosis not present

## 2020-03-23 DIAGNOSIS — J1282 Pneumonia due to coronavirus disease 2019: Secondary | ICD-10-CM | POA: Diagnosis not present

## 2020-03-23 DIAGNOSIS — N183 Chronic kidney disease, stage 3 unspecified: Secondary | ICD-10-CM | POA: Diagnosis not present

## 2020-03-23 DIAGNOSIS — J9621 Acute and chronic respiratory failure with hypoxia: Secondary | ICD-10-CM | POA: Diagnosis not present

## 2020-03-23 DIAGNOSIS — U071 COVID-19: Secondary | ICD-10-CM | POA: Diagnosis not present

## 2020-03-23 DIAGNOSIS — D631 Anemia in chronic kidney disease: Secondary | ICD-10-CM | POA: Diagnosis not present

## 2020-03-23 DIAGNOSIS — I130 Hypertensive heart and chronic kidney disease with heart failure and stage 1 through stage 4 chronic kidney disease, or unspecified chronic kidney disease: Secondary | ICD-10-CM | POA: Diagnosis not present

## 2020-03-23 DIAGNOSIS — I5032 Chronic diastolic (congestive) heart failure: Secondary | ICD-10-CM | POA: Diagnosis not present

## 2020-03-24 ENCOUNTER — Encounter: Payer: Self-pay | Admitting: Nurse Practitioner

## 2020-03-24 ENCOUNTER — Telehealth (INDEPENDENT_AMBULATORY_CARE_PROVIDER_SITE_OTHER): Payer: Medicare Other | Admitting: Nurse Practitioner

## 2020-03-24 ENCOUNTER — Telehealth: Payer: Self-pay | Admitting: Nurse Practitioner

## 2020-03-24 VITALS — BP 120/74 | Wt 134.0 lb

## 2020-03-24 DIAGNOSIS — U071 COVID-19: Secondary | ICD-10-CM | POA: Diagnosis not present

## 2020-03-24 DIAGNOSIS — J1282 Pneumonia due to coronavirus disease 2019: Secondary | ICD-10-CM

## 2020-03-24 DIAGNOSIS — E46 Unspecified protein-calorie malnutrition: Secondary | ICD-10-CM | POA: Diagnosis not present

## 2020-03-24 NOTE — Progress Notes (Signed)
Virtual Visit via Video Note  I connected with@ on 03/24/20 at  2:00 PM EDT by a video enabled telemedicine application and verified that I am speaking with the correct person using two identifiers.  Location: Patient:Home Provider: Office Participants: patient, husband and provider  I discussed the limitations of evaluation and management by telemedicine and the availability of in person appointments. I also discussed with the patient that there may be a patient responsible charge related to this service. The patient expressed understanding and agreed to proceed.  CC:F/up on COVID pneumonia  History of Present Illness: [name] reports improved cough and SOB. No fever, no chest pain, no LE edema. [name] is concerned about inadequate nutrition and oral hydration. [name] state she has no appetite. She denies any ABD pain or nausea or constipation or diarrhea.   Wt Readings from Last 3 Encounters:  03/24/20 134 lb (60.8 kg)  03/12/20 142 lb 10.2 oz (64.7 kg)  03/10/20 142 lb 12.8 oz (64.8 kg)   Observations/Objective: Physical Exam Vitals reviewed.  Constitutional:      General: She is not in acute distress. Neurological:     Mental Status: She is alert and oriented to person, place, and time.    Assessment and Plan: Jane was seen today for follow-up.  Diagnoses and all orders for this visit:  Pneumonia due to COVID-19 virus  Protein-calorie malnutrition, unspecified severity ([employer])   Follow Up Instructions: Maintain current medications Have lab results faxed to me from nephrology after appt on 03/28/2020. Eat small frequent meals, each meal should contain protein. Maintain adequate oral hydration.   I discussed the assessment and treatment plan with the patient. The patient was provided an opportunity to ask questions and all were answered. The patient agreed with the plan and demonstrated an understanding of the instructions.   The patient was advised to call  back or seek an in-person evaluation if the symptoms worsen or if the condition fails to improve as anticipated.   Wilfred Lacy, NP

## 2020-03-24 NOTE — Telephone Encounter (Signed)
[name] is calling and wanted to speak to someone regarding getting verbal orders. Orders are for homehealth OT  time a week for 1 week and 2 times a week for 2 weeks and 1 time a week for one week, please advise. CB is [phone]

## 2020-03-24 NOTE — Telephone Encounter (Signed)
LVM for patient to return call. 

## 2020-03-24 NOTE — Patient Instructions (Addendum)
Maintain current medications Have lab results faxed to me from nephrology after appt on 03/28/2020. Eat small frequent meals, each meal should contain protein. Maintain adequate oral hydration.  Protein-Energy Malnutrition Protein-energy malnutrition is when a person does not eat enough protein, fat, and calories. When this happens over time, it can lead to severe loss of muscle tissue (muscle wasting). This condition also affects the body's defense system (immune system) and can lead to other health problems. What are the causes? This condition may be caused by:  Not eating enough protein, fat, or calories.  Having certain chronic medical conditions.  Eating too little. What increases the risk? The following factors may make you more likely to develop this condition:  Living in poverty.  Long-term hospitalization.  Alcohol or drug dependency. Addiction often leads to a lifestyle in which proper diet is ignored. Dependency can also hurt the metabolism and the body's ability to absorb nutrients.  Eating disorders, such as anorexia nervosa or bulimia.  Chewing or swallowing problems. People with these disorders may not eat enough.  Having certain conditions, such as: ? Inflammatory bowel disease. Inflammation of the intestines makes it difficult for the body to absorb nutrients. ? Cancer or AIDS. These diseases can cause a loss of appetite. ? Chronic heart failure. This interferes with how the body uses nutrients. ? Cystic fibrosis. This disease can make it difficult for the body to absorb nutrients.  Eating a diet that extremely restricts protein, fat, or calorie intake. What are the signs or symptoms? Symptoms of this condition include:  Fatigue.  Weakness.  Dizziness.  Fainting.  Weight loss.  Loss of muscle tone and muscle mass.  Poor immune response.  Lack of menstruation.  Poor memory.  Hair loss.  Skin changes. How is this diagnosed? This condition may  be diagnosed based on:  Your medical and dietary history.  A physical exam. This may include a measurement of your body mass index (BMI).  Blood tests. How is this treated? This condition may be managed with:  Nutrition therapy. This may include working with a diet and nutrition specialist (dietitian).  Treatment for underlying conditions. People with severe protein-energy malnutrition may need to be treated in a hospital. This may involve receiving nutrition and fluids through an IV. Follow these instructions at home:   Eat a balanced diet. In each meal, include at least one food that is high in protein. Foods that are high in protein include: ? Meat. ? Poultry. ? Fish. ? Eggs. ? Cheese. ? Milk. ? Beans. ? Nuts.  Eat nutrient-rich foods that are easy to swallow and digest, such as: ? Fruit and yogurt smoothies. ? Oatmeal with nut butter.  Try to eat six small meals each day instead of three large meals.  Take vitamin and protein supplements as told by your health care provider or dietitian.  Follow your health care provider's recommendations about exercise and activity.  Keep all follow-up visits as told by your health care provider. This is important. Contact a health care provider if you:  Have increased weakness or fatigue.  Faint.  Are a woman and you stop having your period (menstruating).  Have rapid hair loss.  Have unexpected weight loss.  Have diarrhea.  Have nausea and vomiting. Get help right away if you have:  Difficulty breathing.  Chest pain. Summary  Protein-energy malnutrition is when a person does not eat enough protein, fat, and calories.  Protein-energy malnutrition can lead to severe loss of muscle tissue (muscle  wasting). This condition also affects the body's defense system (immune system) and can lead to other health problems.  Talk with your health care provider about treatment for this condition. Effective treatment depends on  the underlying cause of the malnutrition. This information is not intended to replace advice given to you by your health care provider. Make sure you discuss any questions you have with your health care provider. Document Revised: 07/17/2017 Document Reviewed: 07/17/2017 Elsevier Patient Education  2020 [employer].

## 2020-03-25 DIAGNOSIS — D500 Iron deficiency anemia secondary to blood loss (chronic): Secondary | ICD-10-CM | POA: Diagnosis not present

## 2020-03-25 DIAGNOSIS — J9621 Acute and chronic respiratory failure with hypoxia: Secondary | ICD-10-CM | POA: Diagnosis not present

## 2020-03-25 DIAGNOSIS — I130 Hypertensive heart and chronic kidney disease with heart failure and stage 1 through stage 4 chronic kidney disease, or unspecified chronic kidney disease: Secondary | ICD-10-CM | POA: Diagnosis not present

## 2020-03-25 DIAGNOSIS — N183 Chronic kidney disease, stage 3 unspecified: Secondary | ICD-10-CM | POA: Diagnosis not present

## 2020-03-25 DIAGNOSIS — U071 COVID-19: Secondary | ICD-10-CM | POA: Diagnosis not present

## 2020-03-25 DIAGNOSIS — J1282 Pneumonia due to coronavirus disease 2019: Secondary | ICD-10-CM | POA: Diagnosis not present

## 2020-03-25 DIAGNOSIS — I5032 Chronic diastolic (congestive) heart failure: Secondary | ICD-10-CM | POA: Diagnosis not present

## 2020-03-25 DIAGNOSIS — I4819 Other persistent atrial fibrillation: Secondary | ICD-10-CM | POA: Diagnosis not present

## 2020-03-25 DIAGNOSIS — D631 Anemia in chronic kidney disease: Secondary | ICD-10-CM | POA: Diagnosis not present

## 2020-03-25 NOTE — Telephone Encounter (Signed)
See other tel encounters. VO was given.

## 2020-03-26 DIAGNOSIS — Z792 Long term (current) use of antibiotics: Secondary | ICD-10-CM | POA: Diagnosis not present

## 2020-03-26 DIAGNOSIS — Z8673 Personal history of transient ischemic attack (TIA), and cerebral infarction without residual deficits: Secondary | ICD-10-CM | POA: Diagnosis not present

## 2020-03-26 DIAGNOSIS — Z79891 Long term (current) use of opiate analgesic: Secondary | ICD-10-CM | POA: Diagnosis not present

## 2020-03-26 DIAGNOSIS — I420 Dilated cardiomyopathy: Secondary | ICD-10-CM | POA: Diagnosis not present

## 2020-03-26 DIAGNOSIS — N183 Chronic kidney disease, stage 3 unspecified: Secondary | ICD-10-CM | POA: Diagnosis not present

## 2020-03-26 DIAGNOSIS — F329 Major depressive disorder, single episode, unspecified: Secondary | ICD-10-CM | POA: Diagnosis not present

## 2020-03-26 DIAGNOSIS — K5731 Diverticulosis of large intestine without perforation or abscess with bleeding: Secondary | ICD-10-CM | POA: Diagnosis not present

## 2020-03-26 DIAGNOSIS — J9621 Acute and chronic respiratory failure with hypoxia: Secondary | ICD-10-CM | POA: Diagnosis not present

## 2020-03-26 DIAGNOSIS — U071 COVID-19: Secondary | ICD-10-CM | POA: Diagnosis not present

## 2020-03-26 DIAGNOSIS — D500 Iron deficiency anemia secondary to blood loss (chronic): Secondary | ICD-10-CM | POA: Diagnosis not present

## 2020-03-26 DIAGNOSIS — Z940 Kidney transplant status: Secondary | ICD-10-CM | POA: Diagnosis not present

## 2020-03-26 DIAGNOSIS — I081 Rheumatic disorders of both mitral and tricuspid valves: Secondary | ICD-10-CM | POA: Diagnosis not present

## 2020-03-26 DIAGNOSIS — I5032 Chronic diastolic (congestive) heart failure: Secondary | ICD-10-CM | POA: Diagnosis not present

## 2020-03-26 DIAGNOSIS — L409 Psoriasis, unspecified: Secondary | ICD-10-CM | POA: Diagnosis not present

## 2020-03-26 DIAGNOSIS — D631 Anemia in chronic kidney disease: Secondary | ICD-10-CM | POA: Diagnosis not present

## 2020-03-26 DIAGNOSIS — E213 Hyperparathyroidism, unspecified: Secondary | ICD-10-CM | POA: Diagnosis not present

## 2020-03-26 DIAGNOSIS — G4733 Obstructive sleep apnea (adult) (pediatric): Secondary | ICD-10-CM | POA: Diagnosis not present

## 2020-03-26 DIAGNOSIS — I739 Peripheral vascular disease, unspecified: Secondary | ICD-10-CM | POA: Diagnosis not present

## 2020-03-26 DIAGNOSIS — Z86711 Personal history of pulmonary embolism: Secondary | ICD-10-CM | POA: Diagnosis not present

## 2020-03-26 DIAGNOSIS — J1282 Pneumonia due to coronavirus disease 2019: Secondary | ICD-10-CM | POA: Diagnosis not present

## 2020-03-26 DIAGNOSIS — Z7951 Long term (current) use of inhaled steroids: Secondary | ICD-10-CM | POA: Diagnosis not present

## 2020-03-26 DIAGNOSIS — I130 Hypertensive heart and chronic kidney disease with heart failure and stage 1 through stage 4 chronic kidney disease, or unspecified chronic kidney disease: Secondary | ICD-10-CM | POA: Diagnosis not present

## 2020-03-26 DIAGNOSIS — J45909 Unspecified asthma, uncomplicated: Secondary | ICD-10-CM | POA: Diagnosis not present

## 2020-03-26 DIAGNOSIS — F419 Anxiety disorder, unspecified: Secondary | ICD-10-CM | POA: Diagnosis not present

## 2020-03-26 DIAGNOSIS — L989 Disorder of the skin and subcutaneous tissue, unspecified: Secondary | ICD-10-CM | POA: Diagnosis not present

## 2020-03-26 DIAGNOSIS — N2581 Secondary hyperparathyroidism of renal origin: Secondary | ICD-10-CM | POA: Diagnosis not present

## 2020-03-26 DIAGNOSIS — M1030 Gout due to renal impairment, unspecified site: Secondary | ICD-10-CM | POA: Diagnosis not present

## 2020-03-26 DIAGNOSIS — I4819 Other persistent atrial fibrillation: Secondary | ICD-10-CM | POA: Diagnosis not present

## 2020-03-26 DIAGNOSIS — F32A Depression, unspecified: Secondary | ICD-10-CM | POA: Diagnosis not present

## 2020-03-26 DIAGNOSIS — Z9981 Dependence on supplemental oxygen: Secondary | ICD-10-CM | POA: Diagnosis not present

## 2020-03-26 DIAGNOSIS — Z7952 Long term (current) use of systemic steroids: Secondary | ICD-10-CM | POA: Diagnosis not present

## 2020-03-28 ENCOUNTER — Other Ambulatory Visit: Payer: Self-pay

## 2020-03-28 ENCOUNTER — Encounter (HOSPITAL_BASED_OUTPATIENT_CLINIC_OR_DEPARTMENT_OTHER): Payer: Medicare Other | Attending: Internal Medicine | Admitting: Internal Medicine

## 2020-03-28 DIAGNOSIS — Z940 Kidney transplant status: Secondary | ICD-10-CM | POA: Insufficient documentation

## 2020-03-28 DIAGNOSIS — J9621 Acute and chronic respiratory failure with hypoxia: Secondary | ICD-10-CM | POA: Diagnosis not present

## 2020-03-28 DIAGNOSIS — J45909 Unspecified asthma, uncomplicated: Secondary | ICD-10-CM | POA: Diagnosis not present

## 2020-03-28 DIAGNOSIS — I480 Paroxysmal atrial fibrillation: Secondary | ICD-10-CM | POA: Insufficient documentation

## 2020-03-28 DIAGNOSIS — X58XXXA Exposure to other specified factors, initial encounter: Secondary | ICD-10-CM | POA: Insufficient documentation

## 2020-03-28 DIAGNOSIS — I429 Cardiomyopathy, unspecified: Secondary | ICD-10-CM | POA: Insufficient documentation

## 2020-03-28 DIAGNOSIS — I509 Heart failure, unspecified: Secondary | ICD-10-CM | POA: Diagnosis not present

## 2020-03-28 DIAGNOSIS — Z950 Presence of cardiac pacemaker: Secondary | ICD-10-CM | POA: Diagnosis not present

## 2020-03-28 DIAGNOSIS — I130 Hypertensive heart and chronic kidney disease with heart failure and stage 1 through stage 4 chronic kidney disease, or unspecified chronic kidney disease: Secondary | ICD-10-CM | POA: Diagnosis not present

## 2020-03-28 DIAGNOSIS — Z8616 Personal history of COVID-19: Secondary | ICD-10-CM | POA: Diagnosis not present

## 2020-03-28 DIAGNOSIS — S8011XD Contusion of right lower leg, subsequent encounter: Secondary | ICD-10-CM | POA: Diagnosis not present

## 2020-03-28 DIAGNOSIS — N186 End stage renal disease: Secondary | ICD-10-CM | POA: Insufficient documentation

## 2020-03-28 DIAGNOSIS — U071 COVID-19: Secondary | ICD-10-CM | POA: Diagnosis not present

## 2020-03-28 DIAGNOSIS — I132 Hypertensive heart and chronic kidney disease with heart failure and with stage 5 chronic kidney disease, or end stage renal disease: Secondary | ICD-10-CM | POA: Diagnosis not present

## 2020-03-28 DIAGNOSIS — L97812 Non-pressure chronic ulcer of other part of right lower leg with fat layer exposed: Secondary | ICD-10-CM | POA: Diagnosis not present

## 2020-03-28 DIAGNOSIS — N183 Chronic kidney disease, stage 3 unspecified: Secondary | ICD-10-CM | POA: Diagnosis not present

## 2020-03-28 DIAGNOSIS — I5032 Chronic diastolic (congestive) heart failure: Secondary | ICD-10-CM | POA: Diagnosis not present

## 2020-03-28 DIAGNOSIS — M109 Gout, unspecified: Secondary | ICD-10-CM | POA: Diagnosis not present

## 2020-03-28 DIAGNOSIS — J1282 Pneumonia due to coronavirus disease 2019: Secondary | ICD-10-CM | POA: Diagnosis not present

## 2020-03-29 ENCOUNTER — Other Ambulatory Visit: Payer: Self-pay

## 2020-03-29 DIAGNOSIS — N183 Chronic kidney disease, stage 3 unspecified: Secondary | ICD-10-CM | POA: Diagnosis not present

## 2020-03-29 DIAGNOSIS — J9621 Acute and chronic respiratory failure with hypoxia: Secondary | ICD-10-CM | POA: Diagnosis not present

## 2020-03-29 DIAGNOSIS — J1282 Pneumonia due to coronavirus disease 2019: Secondary | ICD-10-CM | POA: Diagnosis not present

## 2020-03-29 DIAGNOSIS — I130 Hypertensive heart and chronic kidney disease with heart failure and stage 1 through stage 4 chronic kidney disease, or unspecified chronic kidney disease: Secondary | ICD-10-CM | POA: Diagnosis not present

## 2020-03-29 DIAGNOSIS — I5032 Chronic diastolic (congestive) heart failure: Secondary | ICD-10-CM | POA: Diagnosis not present

## 2020-03-29 DIAGNOSIS — U071 COVID-19: Secondary | ICD-10-CM | POA: Diagnosis not present

## 2020-03-29 NOTE — Patient Outreach (Signed)
[facility]) Care Management  03/29/2020  [name] 26-Aug-1945 [id number]   Telephone assessment:  Placed call to patient for follow up on recent covid pneumonia.  Patient reports she is feeling better.  Reports she is breathing well with very little cough. Reports she is taking her medications correctly. Reports she has a decreased appetite due to lost of taste.  Reports she is drinking well.   Reports she remain active with PT and OT and had a session today.   PLAN:  Will continue to follow up weekly. Encouraged patient to eat small meals with protein despite no taste.   Tomasa Rand, RN, BSN, CEN [facility] [employer] [phone]

## 2020-03-30 ENCOUNTER — Telehealth: Payer: Self-pay

## 2020-03-30 DIAGNOSIS — I5032 Chronic diastolic (congestive) heart failure: Secondary | ICD-10-CM | POA: Diagnosis not present

## 2020-03-30 DIAGNOSIS — J1282 Pneumonia due to coronavirus disease 2019: Secondary | ICD-10-CM | POA: Diagnosis not present

## 2020-03-30 DIAGNOSIS — U071 COVID-19: Secondary | ICD-10-CM | POA: Diagnosis not present

## 2020-03-30 DIAGNOSIS — J9621 Acute and chronic respiratory failure with hypoxia: Secondary | ICD-10-CM | POA: Diagnosis not present

## 2020-03-30 DIAGNOSIS — I130 Hypertensive heart and chronic kidney disease with heart failure and stage 1 through stage 4 chronic kidney disease, or unspecified chronic kidney disease: Secondary | ICD-10-CM | POA: Diagnosis not present

## 2020-03-30 DIAGNOSIS — N183 Chronic kidney disease, stage 3 unspecified: Secondary | ICD-10-CM | POA: Diagnosis not present

## 2020-03-30 NOTE — Progress Notes (Signed)
JREAM, BROYLES T ([id number]) . Visit Report for 03/28/2020 Arrival Information Details Patient Name: Date of Service: [name], [place] NDA T. 03/28/2020 9:00 A M Medical Record Number: [id number] Patient Account Number: [id number] Date of Birth/Sex: Treating RN: August 05, 1945 (74 y.o. Clearnce Sorrel Primary Care Heavin Sebree: Wilfred Lacy Other Clinician: Referring Maximiano Lott: Treating Hali Balgobin/Extender: Alvira Monday in Treatment: 7 Visit Information History Since Last Visit Added or deleted any medications: No Patient Arrived: Wheel Chair Any new allergies or adverse reactions: No Arrival Time: 09:29 Had a fall or experienced change in No Accompanied By: husband activities of daily living that may affect Transfer Assistance: None risk of falls: Patient Identification Verified: Yes Signs or symptoms of abuse/neglect since last visito No Secondary Verification Process Completed: Yes Hospitalized since last visit: No Patient Has Alerts: Yes Implantable device outside of the clinic excluding No Patient Alerts: Patient on Blood Thinner cellular tissue based products placed in the center takes aspirin since last visit: Pain Present Now: No Electronic Signature(s) Signed: 03/28/2020 6:04:50 PM By: Kela Millin Entered By: Kela Millin on 03/28/2020 09:29:48 -------------------------------------------------------------------------------- Compression Therapy Details Patient Name: Date of Service: [name], [name] NDA T. 03/28/2020 9:00 A M Medical Record Number: [id number] Patient Account Number: [id number] Date of Birth/Sex: Treating RN: Jan 05, 1946 (74 y.o. [name] Primary Care Makari Sanko: Wilfred Lacy Other Clinician: Referring Osceola Holian: Treating Zyair Russi/Extender: Alvira Monday in Treatment: 7 Compression Therapy Performed for Wound Assessment: Wound #2 Right,Medial Lower Leg Performed By: Clinician Levan Hurst,  RN Compression Type: Three Layer Post Procedure Diagnosis Same as Pre-procedure Electronic Signature(s) Signed: 03/30/2020 5:57:30 PM By: Levan Hurst RN, BSN Entered By: Levan Hurst on 03/28/2020 09:50:41 -------------------------------------------------------------------------------- Encounter Discharge Information Details Patient Name: Date of Service: [name], [place] NDA T. 03/28/2020 9:00 A M Medical Record Number: [id number] Patient Account Number: [id number] Date of Birth/Sex: Treating RN: 29-Dec-1945 (74 y.o. Debby Bud Primary Care Williams Dietrick: Wilfred Lacy Other Clinician: Referring Deshawnda Acrey: Treating Lavita Pontius/Extender: Alvira Monday in Treatment: 7 Encounter Discharge Information Items Discharge Condition: Stable Ambulatory Status: Wheelchair Discharge Destination: Home Transportation: Private Auto Accompanied By: husband Schedule Follow-up Appointment: Yes Clinical Summary of Care: Electronic Signature(s) Signed: 03/28/2020 5:15:56 PM By: Deon Pilling Entered By: Deon Pilling on 03/28/2020 10:06:43 -------------------------------------------------------------------------------- Lower Extremity Assessment Details Patient Name: Date of Service: [name], [place] NDA T. 03/28/2020 9:00 A M Medical Record Number: [id number] Patient Account Number: [id number] Date of Birth/Sex: Treating RN: 1945-12-20 (74 y.o. Clearnce Sorrel Primary Care Nhi Butrum: Wilfred Lacy Other Clinician: Referring Khrista Braun: Treating Tejay Hubert/Extender: Alvira Monday in Treatment: 7 Edema Assessment Assessed: [Left: No] [Right: No] Edema: [Left: Ye] [Right: s] Calf Left: Right: Point of Measurement: 37 cm From Medial Instep cm 30.5 cm Ankle Left: Right: Point of Measurement: 17 cm From Medial Instep cm 20.5 cm Vascular Assessment Pulses: Dorsalis Pedis Palpable: [Right:Yes] Electronic Signature(s) Signed: 03/28/2020 6:04:50 PM  By: Kela Millin Entered By: Kela Millin on 03/28/2020 09:33:23 -------------------------------------------------------------------------------- Multi Wound Chart Details Patient Name: Date of Service: [name], [name] NDA T. 03/28/2020 9:00 A M Medical Record Number: [id number] Patient Account Number: [id number] Date of Birth/Sex: Treating RN: 1945/07/23 (74 y.o. [name] Primary Care Jari Carollo: Wilfred Lacy Other Clinician: Referring Diksha Tagliaferro: Treating Ruhee Enck/Extender: Alvira Monday in Treatment: 7 Vital Signs Height(in): 65 Pulse(bpm): 92 Weight(lbs): 142 Blood Pressure(mmHg): 114/72 Body Mass Index(BMI): 24 Temperature(F): 98.6 Respiratory Rate(breaths/min): 18 Photos: [2:No Photos Right, Medial Lower Leg] [N/A:N/A N/A] Wound Location: [2:Trauma] [  N/A:N/A] Wounding Event: [2:Trauma, Other] [N/A:N/A] Primary Etiology: [2:Cataracts, Anemia, Asthma,] [N/A:N/A] Comorbid History: [2:Arrhythmia, Congestive Heart Failure, Hypertension, End Stage Renal Disease, Gout 12/15/2019] [N/A:N/A] Date Acquired: [2:7] [N/A:N/A] Weeks of Treatment: [2:Open] [N/A:N/A] Wound Status: [2:0.5x0.5x0.1] [N/A:N/A] Measurements L x W x D (cm) [2:0.196] [N/A:N/A] A (cm) : rea [2:0.02] [N/A:N/A] Volume (cm) : [2:81.10%] [N/A:N/A] % Reduction in Area: [2:98.90%] [N/A:N/A] % Reduction in Volume: [2:Full Thickness With Exposed Support N/A] Classification: [2:Structures Medium] [N/A:N/A] Exudate Amount: [2:Serosanguineous] [N/A:N/A] Exudate Type: [2:red, brown] [N/A:N/A] Exudate Color: [2:Distinct, outline attached] [N/A:N/A] Wound Margin: [2:Large (67-100%)] [N/A:N/A] Granulation Amount: [2:Red, Pink] [N/A:N/A] Granulation Quality: [2:None Present (0%)] [N/A:N/A] Necrotic Amount: [2:Fat Layer (Subcutaneous Tissue): Yes N/A] Exposed Structures: [2:Fascia: No Tendon: No Muscle: No Joint: No Bone: No Small (1-33%)] [N/A:N/A] Epithelialization: [2:Compression  Therapy] [N/A:N/A] Treatment Notes Wound #2 (Right, Medial Lower Leg) 1. Cleanse With Wound Cleanser Soap and water 2. Periwound Care Moisturizing lotion 3. Primary Dressing Applied Collegen AG Hydrogel or K-Y Jelly 4. Secondary Dressing Dry Gauze 6. Support Layer Applied 3 layer compression wrap Notes netting. Electronic Signature(s) Signed: 03/29/2020 5:12:45 PM By: Linton Ham MD Signed: 03/30/2020 5:57:30 PM By: Levan Hurst RN, BSN Entered By: Linton Ham on 03/28/2020 10:19:54 -------------------------------------------------------------------------------- Multi-Disciplinary Care Plan Details Patient Name: Date of Service: [name], [place] NDA T. 03/28/2020 9:00 A M Medical Record Number: [id number] Patient Account Number: [id number] Date of Birth/Sex: Treating RN: 01-04-1946 (74 y.o. [name] Primary Care Carolena Fairbank: Wilfred Lacy Other Clinician: Referring Jernard Reiber: Treating Gari Hartsell/Extender: Alvira Monday in Treatment: 7 Active Inactive Wound/Skin Impairment Nursing Diagnoses: Impaired tissue integrity Knowledge deficit related to ulceration/compromised skin integrity Goals: Patient/caregiver will verbalize understanding of skin care regimen Date Initiated: 02/08/2020 Target Resolution Date: 04/29/2020 Goal Status: Active Ulcer/skin breakdown will have a volume reduction of 30% by week 4 Date Initiated: 02/08/2020 Date Inactivated: 03/11/2020 Target Resolution Date: 03/11/2020 Goal Status: Met Interventions: Assess patient/caregiver ability to obtain necessary supplies Assess patient/caregiver ability to perform ulcer/skin care regimen upon admission and as needed Assess ulceration(s) every visit Provide education on ulcer and skin care Notes: Electronic Signature(s) Signed: 03/30/2020 5:57:30 PM By: Levan Hurst RN, BSN Entered By: Levan Hurst on 03/28/2020  09:51:36 -------------------------------------------------------------------------------- Pain Assessment Details Patient Name: Date of Service: [name], [name] NDA T. 03/28/2020 9:00 A M Medical Record Number: [id number] Patient Account Number: [id number] Date of Birth/Sex: Treating RN: 1945/09/03 (74 y.o. Clearnce Sorrel Primary Care Kristinia Leavy: Wilfred Lacy Other Clinician: Referring Alekzander Cardell: Treating Cambria Osten/Extender: Alvira Monday in Treatment: 7 Active Problems Location of Pain Severity and Description of Pain Patient Has Paino No Site Locations Pain Management and Medication Current Pain Management: Electronic Signature(s) Signed: 03/28/2020 6:04:50 PM By: Kela Millin Entered By: Kela Millin on 03/28/2020 09:31:08 -------------------------------------------------------------------------------- Patient/Caregiver Education Details Patient Name: Date of Service: [name] NDA T. 9/13/2021andnbsp9:00 A M Medical Record Number: [id number] Patient Account Number: [id number] Date of Birth/Gender: Treating RN: December 25, 1945 (74 y.o. [name] Primary Care Physician: Wilfred Lacy Other Clinician: Referring Physician: Treating Physician/Extender: Alvira Monday in Treatment: 7 Education Assessment Education Provided To: Patient Education Topics Provided Wound/Skin Impairment: Methods: Explain/Verbal Responses: State content correctly Electronic Signature(s) Signed: 03/30/2020 5:57:30 PM By: Levan Hurst RN, BSN Entered By: Levan Hurst on 03/28/2020 09:28:02 -------------------------------------------------------------------------------- Wound Assessment Details Patient Name: Date of Service: [name], [name] NDA T. 03/28/2020 9:00 A M Medical Record Number: [id number] Patient Account Number: [id number] Date of Birth/Sex: Treating RN: Sep 28, 1945 (74 y.o. F) [place],  Larene [place] Primary Care Jerrell Mangel:  Wilfred Lacy Other Clinician: Referring Irwin Toran: Treating Riley Hallum/Extender: Alvira Monday in Treatment: 7 Wound Status Wound Number: 2 Primary Trauma, Other Etiology: Wound Location: Right, Medial Lower Leg Wound Open Wounding Event: Trauma Status: Date Acquired: 12/15/2019 Comorbid Cataracts, Anemia, Asthma, Arrhythmia, Congestive Heart Failure, Weeks Of Treatment: 7 History: Hypertension, End Stage Renal Disease, Gout Clustered Wound: No Photos Photo Uploaded By: Mikeal Hawthorne on 03/29/2020 13:49:13 Wound Measurements Length: (cm) 0.5 Width: (cm) 0.5 Depth: (cm) 0.1 Area: (cm) 0.196 Volume: (cm) 0.02 % Reduction in Area: 81.1% % Reduction in Volume: 98.9% Epithelialization: Small (1-33%) Tunneling: No Undermining: No Wound Description Classification: Full Thickness With Exposed Support Structures Wound Margin: Distinct, outline attached Exudate Amount: Medium Exudate Type: Serosanguineous Exudate Color: red, brown Foul Odor After Cleansing: No Slough/Fibrino No Wound Bed Granulation Amount: Large (67-100%) Exposed Structure Granulation Quality: Red, Pink Fascia Exposed: No Necrotic Amount: None Present (0%) Fat Layer (Subcutaneous Tissue) Exposed: Yes Tendon Exposed: No Muscle Exposed: No Joint Exposed: No Bone Exposed: No Treatment Notes Wound #2 (Right, Medial Lower Leg) 1. Cleanse With Wound Cleanser Soap and water 2. Periwound Care Moisturizing lotion 3. Primary Dressing Applied Collegen AG Hydrogel or K-Y Jelly 4. Secondary Dressing Dry Gauze 6. Support Layer Applied 3 layer compression wrap Notes netting. Electronic Signature(s) Signed: 03/28/2020 6:04:50 PM By: Kela Millin Entered By: Kela Millin on 03/28/2020 09:34:26 -------------------------------------------------------------------------------- Vitals Details Patient Name: Date of Service: [name], [place] NDA T. 03/28/2020 9:00 A M Medical Record  Number: [id number] Patient Account Number: [id number] Date of Birth/Sex: Treating RN: [birth year] (74 y.o. Clearnce Sorrel Primary Care Kachina Niederer: Wilfred Lacy Other Clinician: Referring Kristine Chahal: Treating Kamorie Aldous/Extender: Alvira Monday in Treatment: 7 Vital Signs Time Taken: 09:30 Temperature (F): 98.6 Height (in): 65 Pulse (bpm): 92 Weight (lbs): 142 Respiratory Rate (breaths/min): 18 Body Mass Index (BMI): 23.6 Blood Pressure (mmHg): 114/72 Reference Range: 80 - 120 mg / dl Electronic Signature(s) Signed: 03/28/2020 6:04:50 PM By: Kela Millin Entered By: Kela Millin on 03/28/2020 09:31:01

## 2020-03-30 NOTE — Progress Notes (Signed)
[name] [name] ([id number]) . Visit Report for 03/28/2020 HPI Details Patient Name: Date of Service: [name] [name]. 03/28/2020 9:00 A M Medical Record Number: [id number] Patient Account Number: [id number] Date of Birth/Sex: Treating RN: 1946/01/04 (74 y.o. [name] Primary Care Provider: Wilfred Lacy Other Clinician: Referring Provider: Treating Provider/Extender: Alvira Monday in Treatment: 7 History of Present Illness HPI Description: 02/08/2020; this is a 74 year old woman who hit her leg when frozen chicken that fell out of the freezer. By her description she had a classic swelling without opening and compatible with a hematoma. She developed tissue skin necrosis over the hematoma had drainage out of it. Nevertheless I do not think she was aggressive in getting medical attention. In any case she eventually saw her primary doctor. She had 10 weight 10 days of Keflex which did not help. She again saw her primary physician on 7/15. She was referred here. She came in without anything on the wound. Past medical history includes asthma, depression, paroxysmal atrial fib status post AV nodal ablation and pacemaker, renal transplant, intracranial hemorrhage, cardiomyopathy congestive heart failure IVC filter.Marland Kitchen ABI in our clinic was 1.0 02/18/2020 upon evaluation today patient appears to be doing a little better in regard to her wound. This is a bit clearer obviously than last time. With that being said we did get approval for the snap VAC I think that is a good option for [name] to initiate today. 02/25/20-Patient back at 1 week, continuing with the snap VAC and 3 layer compression, which seems to be working although the dimensions of the same the surface of the wound looks really good 8/20; remarkable improvement since I last saw that she has been using the snap VAC for about 2 weeks. There is now no depth of this wound and I do not think the VAC is going to be  needed any further. 8/27; the patient's wound is still open healthy looking wound surface. We have been using silver collagen. The patient came into clinic today noted by our intake nurses to have altered mental status. O2 sat was recorded at 69% on room air. She seemed to do better with a mask and nonrebreather. She was seen by her primary doctor yesterday felt to have right lower lobe pneumonia and congestive heart failure. She was started on doxycycline. Her husband does not think she was Covid tested. She has a history of congestive heart failure 9/13; when the patient was here the last time we sent her to hospital. She had Covid pneumonitis. Her husband tested positive also but was not sick. She is left the same wrap on since she was here a little over 2 weeks ago. Electronic Signature(s) Signed: 03/29/2020 5:12:45 PM By: Linton Ham MD Entered By: Linton Ham on 03/28/2020 10:20:51 -------------------------------------------------------------------------------- Physical Exam Details Patient Name: Date of Service: [name], [name]. 03/28/2020 9:00 A M Medical Record Number: [id number] Patient Account Number: [id number] Date of Birth/Sex: Treating RN: 23-Jul-1945 (74 y.o. [name] Primary Care Provider: Wilfred Lacy Other Clinician: Referring Provider: Treating Provider/Extender: Alvira Monday in Treatment: 7 Constitutional Sitting or standing Blood Pressure is within target range for patient.. Pulse regular and within target range for patient.Marland Kitchen Respirations regular, non-labored and within target range.. Temperature is normal and within the target range for the patient.Marland Kitchen Appears in no distress. Respiratory work of breathing is normal. Cardiovascular Pedal pulses are palpable. Edema is well controlled. Notes Wound exam; wound is smaller and  appears healthy. Under illumination rims of epithelialization. Right leg swelling is controlled. Pedal  pulses palpable. Electronic Signature(s) Signed: 03/29/2020 5:12:45 PM By: Linton Ham MD Entered By: Linton Ham on 03/28/2020 10:23:28 -------------------------------------------------------------------------------- Physician Orders Details Patient Name: Date of Service: [name], [name]. 03/28/2020 9:00 A M Medical Record Number: [id number] Patient Account Number: [id number] Date of Birth/Sex: Treating RN: Feb 22, 1946 (74 y.o. 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 Dressing Change Frequency Wound #2 Right,Medial Lower Leg Do not change entire dressing for one week. Skin Barriers/Peri-Wound Care Moisturizing lotion Wound Cleansing May shower with protection. - use cast protector Primary Wound Dressing Wound #2 Right,Medial Lower Leg Silver Collagen - moisten with hydrogel Secondary Dressing Wound #2 Right,Medial Lower Leg Dry Gauze Edema Control 3 Layer Compression System - Right Lower Extremity Avoid standing for long periods of time Elevate legs to the level of the heart or above for 30 minutes daily and/or when sitting, a frequency of: - throughout the day Exercise regularly Electronic Signature(s) Signed: 03/29/2020 5:12:45 PM By: Linton Ham MD Signed: 03/30/2020 5:57:30 PM By: Levan Hurst RN, BSN Entered By: Levan Hurst on 03/28/2020 09:50:06 -------------------------------------------------------------------------------- Problem List Details Patient Name: Date of Service: [name], [name]. 03/28/2020 9:00 A M Medical Record Number:  [id number] Patient Account Number: [id number] Date of Birth/Sex: Treating RN: 1946/01/10 (74 y.o. 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 03/28/2020 9:00 A M Medical Record Number: [id number] Patient Account Number: [id number] Date of Birth/Sex: Treating RN: [birth year] (74 y.o. [name] Primary Care Provider: Wilfred Lacy Other Clinician: Referring Provider: Treating Provider/Extender: Alvira Monday in Treatment: 7 Subjective History of Present Illness (HPI) 02/08/2020; this is a 74 year old woman who hit her leg when frozen chicken that fell out of the freezer. By her description she had a classic swelling without opening and compatible with a hematoma. She developed tissue skin necrosis over the hematoma had drainage out of it. Nevertheless I do not think she was aggressive in getting medical attention. In any case she eventually saw her primary doctor. She had 10 weight 10 days of Keflex which did not help. She again saw her primary physician on 7/15. She was referred here. She came in without anything on the wound. Past medical history includes asthma, depression, paroxysmal atrial fib status post AV nodal ablation and pacemaker, renal  transplant, intracranial hemorrhage, cardiomyopathy congestive heart failure IVC filter.Marland Kitchen ABI in our clinic was 1.0 02/18/2020 upon evaluation today patient appears to be doing a little better in regard to her wound. This is a bit clearer obviously than last time. With that being said we did get approval for  the snap VAC I think that is a good option for [name] to initiate today. 02/25/20-Patient back at 1 week, continuing with the snap VAC and 3 layer compression, which seems to be working although the dimensions of the same the surface of the wound looks really good 8/20; remarkable improvement since I last saw that she has been using the snap VAC for about 2 weeks. There is now no depth of this wound and I do not think the VAC is going to be needed any further. 8/27; the patient's wound is still open healthy looking wound surface. We have been using silver collagen. The patient came into clinic today noted by our intake nurses to have altered mental status. O2 sat was recorded at 69% on room air. She seemed to do better with a mask and nonrebreather. She was seen by her primary doctor yesterday felt to have right lower lobe pneumonia and congestive heart failure. She was started on doxycycline. Her husband does not think she was Covid tested. She has a history of congestive heart failure 9/13; when the patient was here the last time we sent her to hospital. She had Covid pneumonitis. Her husband tested positive also but was not sick. She is left the same wrap on since she was here a little over 2 weeks ago. Objective Constitutional Sitting or standing Blood Pressure is within target range for patient.. Pulse regular and within target range for patient.Marland Kitchen Respirations regular, non-labored and within target range.. Temperature is normal and within the target range for the patient.Marland Kitchen Appears in no distress. Vitals Time Taken: 9:30 AM, Height: 65 in, Weight: 142 lbs, BMI: 23.6, Temperature: 98.6 F, Pulse:  92 bpm, Respiratory Rate: 18 breaths/min, Blood Pressure: 114/72 mmHg. Respiratory work of breathing is normal. Cardiovascular Pedal pulses are palpable. Edema is well controlled. General Notes: Wound exam; wound is smaller and appears healthy. Under illumination rims of epithelialization. Right leg swelling is controlled. Pedal pulses palpable. Integumentary (Hair, Skin) Wound #2 status is Open. Original cause of wound was Trauma. The wound is located on the Right,Medial Lower Leg. The wound measures 0.5cm length x 0.5cm width x 0.1cm depth; 0.196cm^2 area and 0.02cm^3 volume. There is Fat Layer (Subcutaneous Tissue) exposed. There is no tunneling or undermining noted. There is a medium amount of serosanguineous drainage noted. The wound margin is distinct with the outline attached to the wound base. There is large (67-100%) red, pink granulation within the wound bed. There is no necrotic tissue within the wound bed. 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 There was a Three Layer Compression Therapy Procedure by Levan Hurst, RN. Post procedure Diagnosis Wound #2: Same as Pre-Procedure Plan Follow-up Appointments: Return Appointment in 1 week. Dressing Change Frequency: Wound #2 Right,Medial Lower Leg: Do not change entire dressing for one week. Skin Barriers/Peri-Wound Care: Moisturizing lotion Wound Cleansing: May shower with protection. - use cast protector Primary Wound Dressing: Wound #2 Right,Medial Lower Leg: Silver Collagen - moisten with hydrogel Secondary Dressing: Wound #2 Right,Medial Lower Leg: Dry Gauze Edema Control: 3 Layer Compression System - Right Lower Extremity Avoid standing for long periods of time Elevate  legs to the level of the heart or above for 30 minutes daily and/or when sitting, a frequency of: - throughout the day Exercise regularly  1. During the course of the patient's illness including hospitalization her dressing was not changed. Nevertheless the wound actually looks quite healthy measuring smaller 2. I did not change the primary dressing which is silver collagen, gauze under 3 layer compression 3. She does not have home health therefore we will see her back in clinic next week Electronic Signature(s) Signed: 03/29/2020 5:12:45 PM By: Linton Ham MD Entered By: Linton Ham on 03/28/2020 10:24:29 -------------------------------------------------------------------------------- SuperBill Details Patient Name: Date of Service: [name], [name]. 03/28/2020 Medical Record Number: [id number] Patient Account Number: [id number] Date of Birth/Sex: Treating RN: 19-Jan-1946 (74 y.o. 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

## 2020-03-30 NOTE — Progress Notes (Addendum)
Spoke to patient to do a COPD disease state call. Patient uninterested in discussing her COPD at this time.  [place] Pharmacist Assistant [phone]

## 2020-03-31 DIAGNOSIS — I130 Hypertensive heart and chronic kidney disease with heart failure and stage 1 through stage 4 chronic kidney disease, or unspecified chronic kidney disease: Secondary | ICD-10-CM | POA: Diagnosis not present

## 2020-03-31 DIAGNOSIS — J1282 Pneumonia due to coronavirus disease 2019: Secondary | ICD-10-CM | POA: Diagnosis not present

## 2020-03-31 DIAGNOSIS — I5032 Chronic diastolic (congestive) heart failure: Secondary | ICD-10-CM | POA: Diagnosis not present

## 2020-03-31 DIAGNOSIS — U071 COVID-19: Secondary | ICD-10-CM | POA: Diagnosis not present

## 2020-03-31 DIAGNOSIS — J9621 Acute and chronic respiratory failure with hypoxia: Secondary | ICD-10-CM | POA: Diagnosis not present

## 2020-03-31 DIAGNOSIS — N183 Chronic kidney disease, stage 3 unspecified: Secondary | ICD-10-CM | POA: Diagnosis not present

## 2020-04-04 ENCOUNTER — Encounter (HOSPITAL_BASED_OUTPATIENT_CLINIC_OR_DEPARTMENT_OTHER): Payer: Medicare Other | Admitting: Internal Medicine

## 2020-04-04 DIAGNOSIS — S81801A Unspecified open wound, right lower leg, initial encounter: Secondary | ICD-10-CM | POA: Diagnosis not present

## 2020-04-04 DIAGNOSIS — L97819 Non-pressure chronic ulcer of other part of right lower leg with unspecified severity: Secondary | ICD-10-CM | POA: Diagnosis not present

## 2020-04-04 DIAGNOSIS — I429 Cardiomyopathy, unspecified: Secondary | ICD-10-CM | POA: Diagnosis not present

## 2020-04-04 DIAGNOSIS — Z940 Kidney transplant status: Secondary | ICD-10-CM | POA: Diagnosis not present

## 2020-04-04 DIAGNOSIS — N183 Chronic kidney disease, stage 3 unspecified: Secondary | ICD-10-CM | POA: Diagnosis not present

## 2020-04-04 DIAGNOSIS — I130 Hypertensive heart and chronic kidney disease with heart failure and stage 1 through stage 4 chronic kidney disease, or unspecified chronic kidney disease: Secondary | ICD-10-CM | POA: Diagnosis not present

## 2020-04-04 DIAGNOSIS — I132 Hypertensive heart and chronic kidney disease with heart failure and with stage 5 chronic kidney disease, or end stage renal disease: Secondary | ICD-10-CM | POA: Diagnosis not present

## 2020-04-04 DIAGNOSIS — J1282 Pneumonia due to coronavirus disease 2019: Secondary | ICD-10-CM | POA: Diagnosis not present

## 2020-04-04 DIAGNOSIS — I480 Paroxysmal atrial fibrillation: Secondary | ICD-10-CM | POA: Diagnosis not present

## 2020-04-04 DIAGNOSIS — U071 COVID-19: Secondary | ICD-10-CM | POA: Diagnosis not present

## 2020-04-04 DIAGNOSIS — Z950 Presence of cardiac pacemaker: Secondary | ICD-10-CM | POA: Diagnosis not present

## 2020-04-04 DIAGNOSIS — J9621 Acute and chronic respiratory failure with hypoxia: Secondary | ICD-10-CM | POA: Diagnosis not present

## 2020-04-04 DIAGNOSIS — I5032 Chronic diastolic (congestive) heart failure: Secondary | ICD-10-CM | POA: Diagnosis not present

## 2020-04-04 DIAGNOSIS — S8011XD Contusion of right lower leg, subsequent encounter: Secondary | ICD-10-CM | POA: Diagnosis not present

## 2020-04-05 ENCOUNTER — Other Ambulatory Visit: Payer: Self-pay

## 2020-04-05 DIAGNOSIS — I5032 Chronic diastolic (congestive) heart failure: Secondary | ICD-10-CM | POA: Diagnosis not present

## 2020-04-05 DIAGNOSIS — J9621 Acute and chronic respiratory failure with hypoxia: Secondary | ICD-10-CM | POA: Diagnosis not present

## 2020-04-05 DIAGNOSIS — I130 Hypertensive heart and chronic kidney disease with heart failure and stage 1 through stage 4 chronic kidney disease, or unspecified chronic kidney disease: Secondary | ICD-10-CM | POA: Diagnosis not present

## 2020-04-05 DIAGNOSIS — N183 Chronic kidney disease, stage 3 unspecified: Secondary | ICD-10-CM | POA: Diagnosis not present

## 2020-04-05 DIAGNOSIS — J1282 Pneumonia due to coronavirus disease 2019: Secondary | ICD-10-CM | POA: Diagnosis not present

## 2020-04-05 DIAGNOSIS — U071 COVID-19: Secondary | ICD-10-CM | POA: Diagnosis not present

## 2020-04-05 DIAGNOSIS — Z940 Kidney transplant status: Secondary | ICD-10-CM | POA: Diagnosis not present

## 2020-04-05 NOTE — Patient Outreach (Signed)
[facility]) Care Management  04/05/2020  [name] 10-Jan-1946 [id number]   Telephone assessment:  Placed call to patient who reports she is doing well. Reports she continues to take her medication as prescribed. Reports she is getting her energy back. Reports sleeping well.  Reports she continues to work on meals. States she eats something 3 times per day.  Reports no new problems or concerns. States her breathing is good.   PLAN: will call patient back in 1 week for follow up.   Tomasa Rand, RN, BSN, CEN [facility] [employer] [phone]

## 2020-04-05 NOTE — Progress Notes (Signed)
EMONNI, DEPASQUALE T ([id number]) . Visit Report for 04/04/2020 Arrival Information Details Patient Name: Date of Service: [name] NDA T. 04/04/2020 9:15 A M Medical Record Number: [id number] Patient Account Number: [id number] Date of Birth/Sex: Treating RN: 1946/05/11 (74 y.o. Clearnce Sorrel Primary Care Cleon Signorelli: Wilfred Lacy Other Clinician: Referring Salene Mohamud: Treating Medea Deines/Extender: Alvira Monday in Treatment: 8 Visit Information History Since Last Visit Added or deleted any medications: No Patient Arrived: Kasandra Knudsen Any new allergies or adverse reactions: No Arrival Time: 09:06 Had a fall or experienced change in No Accompanied By: self activities of daily living that may affect Transfer Assistance: None risk of falls: Patient Identification Verified: Yes Signs or symptoms of abuse/neglect since last visito No Secondary Verification Process Completed: Yes Hospitalized since last visit: No Patient Has Alerts: Yes Implantable device outside of the clinic excluding No Patient Alerts: Patient on Blood Thinner cellular tissue based products placed in the center takes aspirin since last visit: Has Dressing in Place as Prescribed: Yes Has Compression in Place as Prescribed: Yes Pain Present Now: No Electronic Signature(s) Signed: 04/04/2020 1:33:30 PM By: Kela Millin Entered By: Kela Millin on 04/04/2020 09:06:50 -------------------------------------------------------------------------------- Clinic Level of Care Assessment Details Patient Name: Date of Service: [name] NDA T. 04/04/2020 9:15 A M Medical Record Number: [id number] Patient Account Number: [id number] Date of Birth/Sex: Treating RN: January 31, 1946 (74 y.o. [name] Primary Care Abednego Yeates: Wilfred Lacy Other Clinician: Referring Layten Aiken: Treating Kanesha Cadle/Extender: Alvira Monday in Treatment: 8 Clinic Level of Care Assessment  Items TOOL 4 Quantity Score X- 1 0 Use when only an EandM is performed on FOLLOW-UP visit ASSESSMENTS - Nursing Assessment / Reassessment X- 1 10 Reassessment of Co-morbidities (includes updates in patient status) X- 1 5 Reassessment of Adherence to Treatment Plan ASSESSMENTS - Wound and Skin A ssessment / Reassessment X - Simple Wound Assessment / Reassessment - one wound 1 5 []  - 0 Complex Wound Assessment / Reassessment - multiple wounds []  - 0 Dermatologic / Skin Assessment (not related to wound area) ASSESSMENTS - Focused Assessment []  - 0 Circumferential Edema Measurements - multi extremities []  - 0 Nutritional Assessment / Counseling / Intervention X- 1 5 Lower Extremity Assessment (monofilament, tuning fork, pulses) []  - 0 Peripheral Arterial Disease Assessment (using hand held doppler) ASSESSMENTS - Ostomy and/or Continence Assessment and Care []  - 0 Incontinence Assessment and Management []  - 0 Ostomy Care Assessment and Management (repouching, etc.) PROCESS - Coordination of Care X - Simple Patient / Family Education for ongoing care 1 15 []  - 0 Complex (extensive) Patient / Family Education for ongoing care X- 1 10 Staff obtains Programmer, systems, Records, T Results / Process Orders est []  - 0 Staff telephones HHA, Nursing Homes / Clarify orders / etc []  - 0 Routine Transfer to another Facility (non-emergent condition) []  - 0 Routine Hospital Admission (non-emergent condition) []  - 0 New Admissions / Biomedical engineer / Ordering NPWT Apligraf, etc. , []  - 0 Emergency Hospital Admission (emergent condition) X- 1 10 Simple Discharge Coordination []  - 0 Complex (extensive) Discharge Coordination PROCESS - Special Needs []  - 0 Pediatric / Minor Patient Management []  - 0 Isolation Patient Management []  - 0 Hearing / Language / Visual special needs []  - 0 Assessment of Community assistance (transportation, D/C planning, etc.) []  - 0 Additional  assistance / Altered mentation []  - 0 Support Surface(s) Assessment (bed, cushion, seat, etc.) INTERVENTIONS - Wound Cleansing / Measurement X - Simple Wound Cleansing -  one wound 1 5 []  - 0 Complex Wound Cleansing - multiple wounds X- 1 5 Wound Imaging (photographs - any number of wounds) []  - 0 Wound Tracing (instead of photographs) X- 1 5 Simple Wound Measurement - one wound []  - 0 Complex Wound Measurement - multiple wounds INTERVENTIONS - Wound Dressings []  - 0 Small Wound Dressing one or multiple wounds []  - 0 Medium Wound Dressing one or multiple wounds []  - 0 Large Wound Dressing one or multiple wounds []  - 0 Application of Medications - topical []  - 0 Application of Medications - injection INTERVENTIONS - Miscellaneous []  - 0 External ear exam []  - 0 Specimen Collection (cultures, biopsies, blood, body fluids, etc.) []  - 0 Specimen(s) / Culture(s) sent or taken to Lab for analysis []  - 0 Patient Transfer (multiple staff / Civil Service fast streamer / Similar devices) []  - 0 Simple Staple / Suture removal (25 or less) []  - 0 Complex Staple / Suture removal (26 or more) []  - 0 Hypo / Hyperglycemic Management (close monitor of Blood Glucose) []  - 0 Ankle / Brachial Index (ABI) - do not check if billed separately X- 1 5 Vital Signs Has the patient been seen at the hospital within the last three years: Yes Total Score: 80 Level Of Care: New/Established - Level 3 Electronic Signature(s) Signed: 04/04/2020 5:09:36 PM By: Levan Hurst RN, BSN Entered By: Levan Hurst on 04/04/2020 09:19:57 -------------------------------------------------------------------------------- Encounter Discharge Information Details Patient Name: Date of Service: [name], [name] NDA T. 04/04/2020 9:15 A M Medical Record Number: [id number] Patient Account Number: [id number] Date of Birth/Sex: Treating RN: August 12, 1945 (74 y.o. Debby Bud Primary Care Elmo Rio: Wilfred Lacy Other  Clinician: Referring Jerome Viglione: Treating Kyndel Egger/Extender: Alvira Monday in Treatment: 8 Encounter Discharge Information Items Discharge Condition: Stable Ambulatory Status: Cane Discharge Destination: Home Transportation: Private Auto Accompanied By: husband Schedule Follow-up Appointment: No Clinical Summary of Care: Electronic Signature(s) Signed: 04/04/2020 5:10:33 PM By: Deon Pilling Entered By: Deon Pilling on 04/04/2020 09:28:42 -------------------------------------------------------------------------------- Lower Extremity Assessment Details Patient Name: Date of Service: [name] NDA T. 04/04/2020 9:15 A M Medical Record Number: [id number] Patient Account Number: [id number] Date of Birth/Sex: Treating RN: Dec 03, 1945 (74 y.o. Clearnce Sorrel Primary Care Emrie Gayle: Wilfred Lacy Other Clinician: Referring Nykole Matos: Treating Iris Hairston/Extender: Alvira Monday in Treatment: 8 Edema Assessment Assessed: [Left: No] [Right: No] Edema: [Left: Ye] [Right: s] Calf Left: Right: Point of Measurement: 37 cm From Medial Instep cm 28.5 cm Ankle Left: Right: Point of Measurement: 17 cm From Medial Instep cm 20 cm Vascular Assessment Pulses: Dorsalis Pedis Palpable: [Right:Yes] Electronic Signature(s) Signed: 04/04/2020 1:33:30 PM By: Kela Millin Entered By: Kela Millin on 04/04/2020 09:10:10 -------------------------------------------------------------------------------- Multi Wound Chart Details Patient Name: Date of Service: [name], [name] NDA T. 04/04/2020 9:15 A M Medical Record Number: [id number] Patient Account Number: [id number] Date of Birth/Sex: Treating RN: [birth year] (74 y.o. [name] Primary Care Mihaela Fajardo: Wilfred Lacy Other Clinician: Referring Seana Underwood: Treating Marquel Spoto/Extender: Alvira Monday in Treatment: 8 Vital Signs Height(in): 65 Pulse(bpm):  68 Weight(lbs): 142 Blood Pressure(mmHg): 148/80 Body Mass Index(BMI): 24 Temperature(F): 97.5 Respiratory Rate(breaths/min): 18 Photos: [2:No Photos Right, Medial Lower Leg] [N/A:N/A N/A] Wound Location: [2:Trauma] [N/A:N/A] Wounding Event: [2:Trauma, Other] [N/A:N/A] Primary Etiology: [2:Cataracts, Anemia, Asthma,] [N/A:N/A] Comorbid History: [2:Arrhythmia, Congestive Heart Failure, Hypertension, End Stage Renal Disease, Gout 12/15/2019] [N/A:N/A] Date Acquired: [2:8] [N/A:N/A] Weeks of Treatment: [2:Healed - Epithelialized] [N/A:N/A] Wound Status: [2:0x0x0] [N/A:N/A] Measurements L x W x D (  cm) [2:0] [N/A:N/A] A (cm) : rea [2:0] [N/A:N/A] Volume (cm) : [2:100.00%] [N/A:N/A] % Reduction in Area: [2:100.00%] [N/A:N/A] % Reduction in Volume: [2:Full Thickness With Exposed Support] [N/A:N/A] Classification: [2:Structures None Present] [N/A:N/A] Exudate Amount: [2:Distinct, outline attached] [N/A:N/A] Wound Margin: [2:None Present (0%)] [N/A:N/A] Granulation Amount: [2:None Present (0%)] [N/A:N/A] Necrotic Amount: [2:Fascia: No] [N/A:N/A] Exposed Structures: [2:Fat Layer (Subcutaneous Tissue): No Tendon: No Muscle: No Joint: No Bone: No Large (67-100%)] [N/A:N/A] Treatment Notes Electronic Signature(s) Signed: 04/04/2020 5:09:36 PM By: Levan Hurst RN, BSN Signed: 04/05/2020 4:19:57 PM By: Linton Ham MD Entered By: Linton Ham on 04/04/2020 10:93:23 -------------------------------------------------------------------------------- Multi-Disciplinary Care Plan Details Patient Name: Date of Service: [name], [place] NDA T. 04/04/2020 9:15 A M Medical Record Number: [id number] Patient Account Number: [id number] Date of Birth/Sex: Treating RN: [birth year] (74 y.o. [name] Primary Care Lusine Corlett: Wilfred Lacy Other Clinician: Referring Carlis Burnsworth: Treating Jeramy Dimmick/Extender: Alvira Monday in Treatment: 8 Active Inactive Electronic  Signature(s) Signed: 04/04/2020 5:09:36 PM By: Levan Hurst RN, BSN Entered By: Levan Hurst on 04/04/2020 09:14:03 -------------------------------------------------------------------------------- Pain Assessment Details Patient Name: Date of Service: [name], [name] NDA T. 04/04/2020 9:15 A M Medical Record Number: [id number] Patient Account Number: [id number] Date of Birth/Sex: Treating RN: 08-12-1945 (74 y.o. Clearnce Sorrel Primary Care Nuala Chiles: Wilfred Lacy Other Clinician: Referring Treshawn Allen: Treating Caral Whan/Extender: Alvira Monday in Treatment: 8 Active Problems Location of Pain Severity and Description of Pain Patient Has Paino No Site Locations Pain Management and Medication Current Pain Management: Electronic Signature(s) Signed: 04/04/2020 1:33:30 PM By: Kela Millin Entered By: Kela Millin on 04/04/2020 09:07:49 -------------------------------------------------------------------------------- Patient/Caregiver Education Details Patient Name: Date of Service: [name] NDA T. 9/20/2021andnbsp9:15 A M Medical Record Number: [id number] Patient Account Number: [id number] Date of Birth/Gender: Treating RN: 1945/09/06 (74 y.o. [name] Primary Care Physician: Wilfred Lacy Other Clinician: Referring Physician: Treating Physician/Extender: Alvira Monday in Treatment: 8 Education Assessment Education Provided To: Patient Education Topics Provided Wound/Skin Impairment: Methods: Explain/Verbal Responses: State content correctly Electronic Signature(s) Signed: 04/04/2020 5:09:36 PM By: Levan Hurst RN, BSN Entered By: Levan Hurst on 04/04/2020 09:14:15 -------------------------------------------------------------------------------- Wound Assessment Details Patient Name: Date of Service: [name], [name] NDA T. 04/04/2020 9:15 A M Medical Record Number: [id number] Patient Account Number:  [id number] Date of Birth/Sex: Treating RN: [birth year] (74 y.o. Clearnce Sorrel Primary Care Eshani Maestre: Wilfred Lacy Other Clinician: Referring Syniyah Bourne: Treating Katlynn Naser/Extender: Alvira Monday in Treatment: 8 Wound Status Wound Number: 2 Primary Trauma, Other Etiology: Wound Location: Right, Medial Lower Leg Wound Healed - Epithelialized Wounding Event: Trauma Status: Date Acquired: 12/15/2019 Comorbid Cataracts, Anemia, Asthma, Arrhythmia, Congestive Heart Failure, Weeks Of Treatment: 8 History: Hypertension, End Stage Renal Disease, Gout Clustered Wound: No Wound Measurements Length: (cm) Width: (cm) Depth: (cm) Area: (cm) Volume: (cm) 0 % Reduction in Area: 100% 0 % Reduction in Volume: 100% 0 Epithelialization: Large (67-100%) 0 Tunneling: No 0 Undermining: No Wound Description Classification: Full Thickness With Exposed Support Structures Wound Margin: Distinct, outline attached Exudate Amount: None Present Foul Odor After Cleansing: No Slough/Fibrino No Wound Bed Granulation Amount: None Present (0%) Exposed Structure Necrotic Amount: None Present (0%) Fascia Exposed: No Fat Layer (Subcutaneous Tissue) Exposed: No Tendon Exposed: No Muscle Exposed: No Joint Exposed: No Bone Exposed: No Electronic Signature(s) Signed: 04/04/2020 1:33:30 PM By: Kela Millin Entered By: Kela Millin on 04/04/2020 09:11:02 -------------------------------------------------------------------------------- Vitals Details Patient Name: Date of Service: [name], [place] NDA T. 04/04/2020 9:15 A M Medical  Record Number: [id number] Patient Account Number: [id number] Date of Birth/Sex: Treating RN: 27-Jan-1946 (74 y.o. Clearnce Sorrel Primary Care Gustave Lindeman: Wilfred Lacy Other Clinician: Referring Cresencia Asmus: Treating Laraine Samet/Extender: Alvira Monday in Treatment: 8 Vital Signs Time Taken: 09:06 Temperature  (F): 97.5 Height (in): 65 Pulse (bpm): 73 Weight (lbs): 142 Respiratory Rate (breaths/min): 18 Body Mass Index (BMI): 23.6 Blood Pressure (mmHg): 148/80 Reference Range: 80 - 120 mg / dl Electronic Signature(s) Signed: 04/04/2020 1:33:30 PM By: Kela Millin Entered By: Kela Millin on 04/04/2020 09:07:43

## 2020-04-05 NOTE — Progress Notes (Signed)
MIRIAN, CASCO T ([id number]) . Visit Report for 04/04/2020 HPI Details Patient Name: Date of Service: [name] NDA T. 04/04/2020 9:15 A M Medical Record Number: [id number] Patient Account Number: [id number] Date of Birth/Sex: Treating RN: 1945-11-06 (74 y.o. [name] Primary Care Provider: Wilfred Lacy Other Clinician: Referring Provider: Treating Provider/Extender: Alvira Monday in Treatment: 8 History of Present Illness HPI Description: 02/08/2020; this is a 74 year old woman who hit her leg when frozen chicken that fell out of the freezer. By her description she had a classic swelling without opening and compatible with a hematoma. She developed tissue skin necrosis over the hematoma had drainage out of it. Nevertheless I do not think she was aggressive in getting medical attention. In any case she eventually saw her primary doctor. She had 10 weight 10 days of Keflex which did not help. She again saw her primary physician on 7/15. She was referred here. She came in without anything on the wound. Past medical history includes asthma, depression, paroxysmal atrial fib status post AV nodal ablation and pacemaker, renal transplant, intracranial hemorrhage, cardiomyopathy congestive heart failure IVC filter.Marland Kitchen ABI in our clinic was 1.0 02/18/2020 upon evaluation today patient appears to be doing a little better in regard to her wound. This is a bit clearer obviously than last time. With that being said we did get approval for the snap VAC I think that is a good option for [name] to initiate today. 02/25/20-Patient back at 1 week, continuing with the snap VAC and 3 layer compression, which seems to be working although the dimensions of the same the surface of the wound looks really good 8/20; remarkable improvement since I last saw that she has been using the snap VAC for about 2 weeks. There is now no depth of this wound and I do not think the VAC is going to be  needed any further. 8/27; the patient's wound is still open healthy looking wound surface. We have been using silver collagen. The patient came into clinic today noted by our intake nurses to have altered mental status. O2 sat was recorded at 69% on room air. She seemed to do better with a mask and nonrebreather. She was seen by her primary doctor yesterday felt to have right lower lobe pneumonia and congestive heart failure. She was started on doxycycline. Her husband does not think she was Covid tested. She has a history of congestive heart failure 9/13; when the patient was here the last time we sent her to hospital. She had Covid pneumonitis. Her husband tested positive also but was not sick. She is left the same wrap on since she was here a little over 2 weeks ago. 9/20; patient arrives in clinic with the area on her medial lower leg healed. This was initially trauma with historically an underlying hematoma that was not managed well because the patient did not seek medical care. We got this to fill in nicely with a snap VAC for about 3 weeks and she is closed over with silver collagen under compression. She does not have a wound care history and I do not think there is any secondary prevention that I can suggest Electronic Signature(s) Signed: 04/05/2020 4:19:57 PM By: Linton Ham MD Entered By: Linton Ham on 04/04/2020 09:27:19 -------------------------------------------------------------------------------- Physical Exam Details Patient Name: Date of Service: [name], [name] NDA T. 04/04/2020 9:15 A M Medical Record Number: [id number] Patient Account Number: [id number] Date of Birth/Sex: Treating RN: [birth year] (74 y.o. Benjamine Sprague, Briant Cedar  Primary Care Provider: Wilfred Lacy Other Clinician: Referring Provider: Treating Provider/Extender: Alvira Monday in Treatment: 8 Constitutional Patient is hypertensive.. Pulse regular and within target range for patient.Marland Kitchen  Respirations regular, non-labored and within target range.. Temperature is normal and within the target range for the patient.Marland Kitchen Appears in no distress. Notes Wound exam; wound is healed. Healthy epithelialization. There is no swelling around the wound Electronic Signature(s) Signed: 04/05/2020 4:19:57 PM By: Linton Ham MD Entered By: Linton Ham on 04/04/2020 09:28:15 -------------------------------------------------------------------------------- Physician Orders Details Patient Name: Date of Service: [name], [name] NDA T. 04/04/2020 9:15 A M Medical Record Number: [id number] Patient Account Number: [id number] Date of Birth/Sex: Treating RN: 04-12-1946 (74 y.o. 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 04/04/2020 9:15 A M Medical Record Number: [id number] Patient Account Number: [id number] Date of Birth/Sex: Treating RN: January 15, 1946 (74 y.o. [name] Primary Care Provider:  Wilfred Lacy Other Clinician: Referring Provider: Treating Provider/Extender: Alvira Monday in Treatment: 8 Active Problems ICD-10 Encounter Code Description Active Date MDM Diagnosis S80.11XD Contusion of right lower leg, subsequent encounter 02/08/2020 No Yes L97.813 Non-pressure chronic ulcer of other part of right lower leg with necrosis of 02/08/2020 No Yes muscle Inactive Problems Resolved Problems Electronic Signature(s) Signed: 04/05/2020 4:19:57 PM By: Linton Ham MD Entered By: Linton Ham on 04/04/2020 09:23:59 -------------------------------------------------------------------------------- Progress Note Details Patient Name: Date of Service: [name], [name] NDA T. 04/04/2020 9:15 A M Medical Record Number: [id number] Patient Account Number: [id number] Date of Birth/Sex: Treating RN: 10/01/1945 (74 y.o. [name] Primary Care Provider: Wilfred Lacy Other Clinician: Referring Provider: Treating Provider/Extender: Alvira Monday in Treatment: 8 Subjective History of Present Illness (HPI) 02/08/2020; this is a 74 year old woman who hit her leg when frozen chicken that fell out of the freezer. By her description she had a classic swelling without opening and compatible with a hematoma. She developed tissue skin necrosis over the hematoma had drainage out of it. Nevertheless I do not think she was aggressive in getting medical attention. In any case she eventually saw her primary doctor. She had 10 weight 10 days of Keflex which did not help. She again saw her primary physician on 7/15. She was referred here. She came in without anything on the wound. Past medical history includes asthma, depression, paroxysmal atrial fib status post AV nodal ablation and pacemaker, renal transplant, intracranial hemorrhage, cardiomyopathy congestive heart failure IVC filter.Marland Kitchen ABI in our clinic was 1.0 02/18/2020 upon evaluation  today patient appears to be doing a little better in regard to her wound. This is a bit clearer obviously than last time. With that being said we did get approval for  the snap VAC I think that is a good option for [name] to initiate today. 02/25/20-Patient back at 1 week, continuing with the snap VAC and 3 layer compression, which seems to be working although the dimensions of the same the surface of the wound looks really good 8/20; remarkable improvement since I last saw that she has been using the snap VAC for about 2 weeks. There is now no depth of this wound and I do not think the VAC is going to be needed any further. 8/27; the patient's wound is still open healthy looking wound surface. We have been using silver collagen. The patient came into clinic today noted by our intake nurses to have altered mental status. O2 sat was recorded at 69% on room air. She seemed to do better with a mask and nonrebreather. She was seen by her primary doctor yesterday felt to have right lower lobe pneumonia and congestive heart failure. She was started on doxycycline. Her husband does not think she was Covid tested. She has a history of congestive heart failure 9/13; when the patient was here the last time we sent her to hospital. She had Covid pneumonitis. Her husband tested positive also but was not sick. She is left the same wrap on since she was here a little over 2 weeks ago. 9/20; patient arrives in clinic with the area on her medial lower leg healed. This was initially trauma with historically an underlying hematoma that was not managed well because the patient did not seek medical care. We got this to fill in nicely with a snap VAC for about 3 weeks and she is closed over with silver collagen under compression. She does not have a wound care history and I do not think there is any secondary prevention that I can suggest Objective Constitutional Patient is hypertensive.. Pulse regular and within target range  for patient.Marland Kitchen Respirations regular, non-labored and within target range.. Temperature is normal and within the target range for the patient.Marland Kitchen Appears in no distress. Vitals Time Taken: 9:06 AM, Height: 65 in, Weight: 142 lbs, BMI: 23.6, Temperature: 97.5 F, Pulse: 73 bpm, Respiratory Rate: 18 breaths/min, Blood Pressure: 148/80 mmHg. General Notes: Wound exam; wound is healed. Healthy epithelialization. There is no swelling around the wound Integumentary (Hair, Skin) Wound #2 status is Healed - Epithelialized. Original cause of wound was Trauma. The wound is located on the Right,Medial Lower Leg. The wound measures 0cm length x 0cm width x 0cm depth; 0cm^2 area and 0cm^3 volume. There is no tunneling or undermining noted. There is a none present amount of drainage noted. The wound margin is distinct with the outline attached to the wound base. There is no granulation within the wound bed. There is no necrotic tissue within the wound bed. 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 I do not have any suggestions for secondary prevention. 2. Skin moisturizing is also always a good thing in older people 3. This was trauma with a hematoma. The hematoma was not recognized because she did not seek medical attention 4. We close this down with the  aid of a snap VAC would certainly help to get this closed expediently Electronic Signature(s) Signed: 04/05/2020 4:19:57 PM By: Linton Ham MD Entered By: Linton Ham on 04/04/2020  09:29:26 -------------------------------------------------------------------------------- SuperBill Details Patient Name: Date of Service: [name], [name] NDA T. 04/04/2020 Medical Record Number: [id number] Patient Account Number: [id number] Date of Birth/Sex: Treating RN: 06/01/1946 (74 y.o. 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

## 2020-04-07 DIAGNOSIS — J9621 Acute and chronic respiratory failure with hypoxia: Secondary | ICD-10-CM | POA: Diagnosis not present

## 2020-04-07 DIAGNOSIS — I5032 Chronic diastolic (congestive) heart failure: Secondary | ICD-10-CM | POA: Diagnosis not present

## 2020-04-07 DIAGNOSIS — I130 Hypertensive heart and chronic kidney disease with heart failure and stage 1 through stage 4 chronic kidney disease, or unspecified chronic kidney disease: Secondary | ICD-10-CM | POA: Diagnosis not present

## 2020-04-07 DIAGNOSIS — N183 Chronic kidney disease, stage 3 unspecified: Secondary | ICD-10-CM | POA: Diagnosis not present

## 2020-04-07 DIAGNOSIS — J1282 Pneumonia due to coronavirus disease 2019: Secondary | ICD-10-CM | POA: Diagnosis not present

## 2020-04-07 DIAGNOSIS — U071 COVID-19: Secondary | ICD-10-CM | POA: Diagnosis not present

## 2020-04-11 ENCOUNTER — Other Ambulatory Visit: Payer: Self-pay

## 2020-04-12 ENCOUNTER — Encounter: Payer: Self-pay | Admitting: Nurse Practitioner

## 2020-04-12 ENCOUNTER — Other Ambulatory Visit: Payer: Self-pay

## 2020-04-12 ENCOUNTER — Ambulatory Visit (INDEPENDENT_AMBULATORY_CARE_PROVIDER_SITE_OTHER): Payer: Medicare Other | Admitting: Nurse Practitioner

## 2020-04-12 VITALS — BP 130/76 | HR 73 | Temp 97.2°F | Ht 65.0 in | Wt 134.6 lb

## 2020-04-12 DIAGNOSIS — J1282 Pneumonia due to coronavirus disease 2019: Secondary | ICD-10-CM

## 2020-04-12 DIAGNOSIS — U071 COVID-19: Secondary | ICD-10-CM | POA: Diagnosis not present

## 2020-04-12 NOTE — Patient Outreach (Signed)
[facility]) Care Management  04/12/2020  [name] Mar 09, 1946 [id number]   Case closure:  Placed call to patient for follow up from MD office visit. Patient reports office visit went well. Reports she is breathing well and walking well. Reports she is eating better.  States she occasionally has time she does not want to get out of the bed.  Reports she is managing her medications without difficulty.   Continues to be active with PT.  Reports no new problems or concerns. Will close case today as all goals are met and patient doing well.  Reviewed with mail I would mail an case closed letter and encouraged her to call me for problems or concerns in the future. She agreed.   PLAN: case closed goals met.  Tomasa Rand, RN, BSN, CEN [facility] [employer] [phone]

## 2020-04-12 NOTE — Patient Instructions (Signed)
Maintain current medications  

## 2020-04-12 NOTE — Progress Notes (Addendum)
Subjective:  Patient ID: [name], female    DOB: 10-Oct-1945  Age: 74 y.o. MRN: [id number]  CC: Follow-up (Pneumonia f/u, Pt states she has been feeling up and down. Pt states today is a good day. )   HPI Accompanied by husband  Pneumonia due to COVID-19 virus Reports improved SOB and fatigue. Improved oxygen saturation on room air Persistent poor appetite and 8lbs weight loss in last 48month. Husband reports she does not eat enough. Reports she is able to complete home PT and OT with intermittent breaks.  Advised to supplement meals with Boost BID No repeat CXR needed at this time.  Wt Readings from Last 3 Encounters:  04/12/20 134 lb 9.6 oz (61.1 kg)  03/24/20 134 lb (60.8 kg)  03/12/20 142 lb 10.2 oz (64.7 kg)   BP Readings from Last 3 Encounters:  04/12/20 130/76  03/24/20 120/74  03/15/20 130/78   Reviewed past Medical, Social and Family history today.  Outpatient Medications Prior to Visit  Medication Sig Dispense Refill  . acetaminophen (TYLENOL) 500 MG tablet Take 500 mg by mouth every 6 (six) hours as needed for mild pain or headache.     . albuterol (PROVENTIL) (2.5 MG/3ML) 0.083% nebulizer solution Take 3 mLs (2.5 mg total) by nebulization every 6 (six) hours as needed for wheezing or shortness of breath. Dx:J45.909 150 mL 2  . budesonide-formoterol (SYMBICORT) 160-4.5 MCG/ACT inhaler Inhale 2 puffs into the lungs 2 (two) times daily. 1 Inhaler 5  . calcitRIOL (ROCALTROL) 0.25 MCG capsule Take 0.25 mcg by mouth every other day.    . carvedilol (COREG) 12.5 MG tablet Take 2 tablets (25 mg total) by mouth 2 (two) times daily.    Marland Kitchen docusate sodium (COLACE) 100 MG capsule Take 100 mg by mouth daily.     . DULoxetine (CYMBALTA) 30 MG capsule TAKE 1 CAPSULE BY MOUTH EVERY DAY (Patient taking differently: Take 30 mg by mouth daily. ) 90 capsule 2  . Ferrous Sulfate (IRON PO) Take 325 mg by mouth daily.     . furosemide (LASIX) 80 MG tablet Take 1 tablet (80 mg  total) by mouth 2 (two) times daily. (Patient taking differently: Take 160 mg by mouth 2 (two) times daily. ) 60 tablet 3  . magnesium oxide (MAG-OX) 400 MG tablet Take 2 tablets (800 mg total) by mouth 2 (two) times daily. 120 tablet 0  . mycophenolate (MYFORTIC) 180 MG EC tablet Take 180 mg by mouth 2 (two) times daily.    . ondansetron (ZOFRAN) 4 MG tablet Take 1 tablet (4 mg total) by mouth every 6 (six) hours as needed for nausea. 20 tablet 0  . potassium chloride SA (K-DUR,KLOR-CON) 20 MEQ tablet Take 20 mEq by mouth daily.     . predniSONE (DELTASONE) 5 MG tablet Take 1 tablet (5 mg total) by mouth daily. 90 tablet 3  . tacrolimus (PROGRAF) 1 MG capsule Take 2 mg by mouth 2 (two) times daily.     . VENTOLIN HFA 108 (90 Base) MCG/ACT inhaler Inhale 2 puffs into the lungs every 4 (four) hours as needed for wheezing or shortness of breath.      No facility-administered medications prior to visit.    ROS See HPI  Objective:  BP 130/76 (BP Location: Left Arm, Patient Position: Sitting, Cuff Size: Normal)   Pulse 73   Temp (!) 97.2 F (36.2 C) (Temporal)   Ht 5\' 5"  (1.651 m)   Wt 134 lb 9.6 oz (61.1  kg)   SpO2 100%   BMI 22.40 kg/m   Physical Exam Constitutional:      General: She is not in acute distress. Cardiovascular:     Rate and Rhythm: Normal rate and regular rhythm.     Pulses: Normal pulses.     Heart sounds: Normal heart sounds.  Pulmonary:     Effort: Pulmonary effort is normal.     Breath sounds: Normal breath sounds.  Musculoskeletal:     Right lower leg: No edema.     Left lower leg: No edema.  Neurological:     Mental Status: She is alert.    Assessment & Plan:  This visit occurred during the SARS-CoV-2 public health emergency.  Safety protocols were in place, including screening questions prior to the visit, additional usage of staff PPE, and extensive cleaning of exam room while observing appropriate contact time as indicated for disinfecting solutions.     [name] was seen today for follow-up.  Diagnoses and all orders for this visit:  Pneumonia due to COVID-19 virus   Problem List Items Addressed This Visit      Respiratory   Pneumonia due to COVID-19 virus - Primary    Reports improved SOB and fatigue. Improved oxygen saturation on room air Persistent poor appetite and 8lbs weight loss in last 28month. Husband reports she does not eat enough. Reports she is able to complete home PT and OT with intermittent breaks.  Advised to supplement meals with Boost BID No repeat CXR needed at this time.         Follow-up: Return in about 3 months (around 07/12/2020) for depression (video or F2F, 62mins).  Wilfred Lacy, NP

## 2020-04-13 ENCOUNTER — Telehealth: Payer: Self-pay

## 2020-04-13 ENCOUNTER — Telehealth: Payer: Self-pay | Admitting: Nurse Practitioner

## 2020-04-13 DIAGNOSIS — N183 Chronic kidney disease, stage 3 unspecified: Secondary | ICD-10-CM | POA: Diagnosis not present

## 2020-04-13 DIAGNOSIS — U071 COVID-19: Secondary | ICD-10-CM | POA: Diagnosis not present

## 2020-04-13 DIAGNOSIS — J9621 Acute and chronic respiratory failure with hypoxia: Secondary | ICD-10-CM | POA: Diagnosis not present

## 2020-04-13 DIAGNOSIS — I5032 Chronic diastolic (congestive) heart failure: Secondary | ICD-10-CM | POA: Diagnosis not present

## 2020-04-13 DIAGNOSIS — J1282 Pneumonia due to coronavirus disease 2019: Secondary | ICD-10-CM | POA: Diagnosis not present

## 2020-04-13 DIAGNOSIS — I130 Hypertensive heart and chronic kidney disease with heart failure and stage 1 through stage 4 chronic kidney disease, or unspecified chronic kidney disease: Secondary | ICD-10-CM | POA: Diagnosis not present

## 2020-04-13 NOTE — Progress Notes (Signed)
Chronic Care Management Pharmacy Assistant   Name: [name]  MRN: [id number] DOB: 10/25/1945  Reason for Encounter: Medication Review    PCP : [name], NP  Allergies:   Allergies  Allergen Reactions  . Ace Inhibitors Other (See Comments)    Reaction unknown  . 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 Amlodipine   . Warfarin Other (See Comments)    Left side brain hemorrhage  . Warfarin Sodium Other (See Comments)    Caused her to have a stroke    Medications: Outpatient Encounter Medications as of 04/13/2020  Medication Sig Note  . acetaminophen (TYLENOL) 500 MG tablet Take 500 mg by mouth every 6 (six) hours as needed for mild pain or headache.    . albuterol (PROVENTIL) (2.5 MG/3ML) 0.083% nebulizer solution Take 3 mLs (2.5 mg total) by nebulization every 6 (six) hours as needed for wheezing or shortness of breath. Dx:J45.909 03/17/2020: Has this medication but is not currently using nebullizer.   . budesonide-formoterol (SYMBICORT) 160-4.5 MCG/ACT inhaler Inhale 2 puffs into the lungs 2 (two) times daily.   . calcitRIOL (ROCALTROL) 0.25 MCG capsule Take 0.25 mcg by mouth every other day.   . carvedilol (COREG) 12.5 MG tablet Take 2 tablets (25 mg total) by mouth 2 (two) times daily. 03/12/2020: Unable to verify time  . docusate sodium (COLACE) 100 MG capsule Take 100 mg by mouth daily.    . DULoxetine (CYMBALTA) 30 MG capsule TAKE 1 CAPSULE BY MOUTH EVERY DAY (Patient taking differently: Take 30 mg by mouth daily. )   . Ferrous Sulfate (IRON PO) Take 325 mg by mouth daily.    . furosemide (LASIX) 80 MG tablet Take 1 tablet (80 mg total) by mouth 2 (two) times daily. (Patient taking differently: Take 160 mg by mouth 2 (two) times daily. ) 03/17/2020: Reports taking 160 mg twice a day and states this is confirmed by instructions on bottle.   . mycophenolate (MYFORTIC) 180 MG EC tablet Take 180 mg by  mouth 2 (two) times daily.   . ondansetron (ZOFRAN) 4 MG tablet Take 1 tablet (4 mg total) by mouth every 6 (six) hours as needed for nausea.   . potassium chloride SA (K-DUR,KLOR-CON) 20 MEQ tablet Take 20 mEq by mouth daily.    . predniSONE (DELTASONE) 5 MG tablet Take 1 tablet (5 mg total) by mouth daily. 03/17/2020: Will start after dose pack  . tacrolimus (PROGRAF) 1 MG capsule Take 2 mg by mouth 2 (two) times daily.    . VENTOLIN HFA 108 (90 Base) MCG/ACT inhaler Inhale 2 puffs into the lungs every 4 (four) hours as needed for wheezing or shortness of breath.     No facility-administered encounter medications on file as of 04/13/2020.    Current Diagnosis: Patient Active Problem List   Diagnosis Date Noted  . SOB (shortness of breath) 03/11/2020  . Pneumonia due to COVID-19 virus 03/11/2020  . Wound of right leg 01/28/2020  . Traumatic seroma of lower leg, right, subsequent encounter 01/28/2020  . Other secondary pulmonary hypertension ([place]) 12/03/2019  . Acute GI bleeding 11/14/2019  . GI bleed 11/14/2019  . Medication management 11/04/2019  . Acute CHF (congestive heart failure) ([place]) 10/23/2019  . Acute on chronic combined systolic and diastolic congestive heart failure ([place]) 10/22/2019  . S/P AV nodal ablation 10/08/2018  . Cardiac pacemaker in situ 04/18/2018  . Oral  mucositis 02/22/2018  . 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 Personal history of PE (pulmonary embolism) 02/22/2018  . Acute on chronic systolic heart failure ([place]) 02/11/2018  . Hyperkalemia 02/03/2018  . Atrial fibrillation with RVR ([place]) 02/03/2018  . Lower extremity edema 12/10/2017  . Acute kidney injury superimposed on chronic kidney disease ([place]) 11/17/2017  . Acute diastolic (congestive) heart failure ([place]) 11/17/2017  . UTI (urinary tract infection) 11/03/2017  . Diarrhea 11/03/2017  . Presence of Watchman left atrial appendage closure device 10/04/2017  . Chronic respiratory failure with  hypoxia ([place]) 10/03/2017  . Persistent atrial fibrillation ([place]) 08/12/2017  . Dyspnea 07/26/2017  . Atrial flutter ([place])   . Peripheral vascular disease ([place])   . Oligouria   . Hypertension   . Fractures, stress   . Eczema   . Distal radius fracture, right   . Depression   . Constipation   . Arthritis   . Exacerbation of asthma 04/01/2017  . Closed fracture of right distal radius 10/18/2016  . Proteinuria 09/04/2016  . 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 Immunosuppression ([place]) 07/25/2015  . Nonischemic dilated cardiomyopathy ([place])   . S/P insertion of IVC (inferior vena caval) filter 04/07/2013  . Use of cane as ambulatory aid 04/07/2013  . H/O: stroke 04/07/2013  . Expected blood loss anemia 03/26/2012  . S/P hip replacement 03/25/2012  . DVT of lower extremity, bilateral ([place]) 12/21/2011  . Personal history of DVT (deep vein thrombosis) 12/08/2011  . Obstructive sleep apnea 12/08/2011  . End stage renal disease ([place]) 11/06/2011  . 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 Pseudomonas aeruginosa infection 10/24/2011  . ESRD (end stage renal disease) on dialysis ([place]) 09/14/2011  . Hypercalcemia   . Gout 09/10/2011  . Absence of right hip joint with antibiotic pacer 09/03/2011  . Traumatic seroma of thigh ([place]) 06/11/2011  . Clostridium difficile colitis 06/09/2011  . Leukocytosis 06/08/2011  . Anemia 06/06/2011  . ARF (acute renal failure) ([place]) 06/06/2011  . SINUSITIS- ACUTE-NOS 09/27/2010  . PAF (paroxysmal atrial fibrillation) ([place]) 11/25/2008  . RENAL FAILURE, ACUTE 11/25/2008  . CVA 09/28/2008  . CVA (cerebrovascular accident due to intracerebral hemorrhage) ([place]) 09/28/2008  . WEAKNESS, LEFT SIDE OF BODY 09/21/2008  . Morbid obesity ([place]) 10/20/2007  . Psoriasis 10/20/2007  . Stroke due to intracerebral hemorrhage ([place]) 07/17/2007  . Stroke ([place]) 07/17/2007  . Essential hypertension 07/07/2007  . Asthmatic bronchitis without complication 33/83/2919     Goals Addressed   None     Follow-Up:  Medication Cost Review   Completed medication cost analysis for patient current medication.  [place] Pharmacist Assistant [phone]

## 2020-04-13 NOTE — Telephone Encounter (Signed)
LVM for patient to return call. 

## 2020-04-13 NOTE — Telephone Encounter (Signed)
Approved with verbal orders

## 2020-04-13 NOTE — Telephone Encounter (Signed)
[facility] is calling to get orders for OT twice a week for 3 weeks. Please call him back at [phone].

## 2020-04-14 ENCOUNTER — Ambulatory Visit: Payer: Self-pay | Admitting: Psychology

## 2020-04-15 ENCOUNTER — Telehealth: Payer: Self-pay | Admitting: Nurse Practitioner

## 2020-04-15 NOTE — Telephone Encounter (Signed)
Claiborne Billings with [facility] called to let Baldo Ash know that Mishawn missed her physical therapy appt this week, due to family emergency.

## 2020-04-18 DIAGNOSIS — Z940 Kidney transplant status: Secondary | ICD-10-CM | POA: Diagnosis not present

## 2020-04-18 DIAGNOSIS — N2581 Secondary hyperparathyroidism of renal origin: Secondary | ICD-10-CM | POA: Diagnosis not present

## 2020-04-18 DIAGNOSIS — Z8616 Personal history of COVID-19: Secondary | ICD-10-CM | POA: Diagnosis not present

## 2020-04-18 DIAGNOSIS — D631 Anemia in chronic kidney disease: Secondary | ICD-10-CM | POA: Diagnosis not present

## 2020-04-18 DIAGNOSIS — R809 Proteinuria, unspecified: Secondary | ICD-10-CM | POA: Diagnosis not present

## 2020-04-18 DIAGNOSIS — N184 Chronic kidney disease, stage 4 (severe): Secondary | ICD-10-CM | POA: Diagnosis not present

## 2020-04-18 DIAGNOSIS — D849 Immunodeficiency, unspecified: Secondary | ICD-10-CM | POA: Diagnosis not present

## 2020-04-18 LAB — BASIC METABOLIC PANEL: Glucose: 105

## 2020-04-19 DIAGNOSIS — N2581 Secondary hyperparathyroidism of renal origin: Secondary | ICD-10-CM | POA: Diagnosis not present

## 2020-04-19 DIAGNOSIS — R809 Proteinuria, unspecified: Secondary | ICD-10-CM | POA: Diagnosis not present

## 2020-04-19 DIAGNOSIS — Z940 Kidney transplant status: Secondary | ICD-10-CM | POA: Diagnosis not present

## 2020-04-19 DIAGNOSIS — N189 Chronic kidney disease, unspecified: Secondary | ICD-10-CM | POA: Diagnosis not present

## 2020-04-19 DIAGNOSIS — N184 Chronic kidney disease, stage 4 (severe): Secondary | ICD-10-CM | POA: Diagnosis not present

## 2020-04-20 DIAGNOSIS — I130 Hypertensive heart and chronic kidney disease with heart failure and stage 1 through stage 4 chronic kidney disease, or unspecified chronic kidney disease: Secondary | ICD-10-CM | POA: Diagnosis not present

## 2020-04-20 DIAGNOSIS — J9621 Acute and chronic respiratory failure with hypoxia: Secondary | ICD-10-CM | POA: Diagnosis not present

## 2020-04-20 DIAGNOSIS — U071 COVID-19: Secondary | ICD-10-CM | POA: Diagnosis not present

## 2020-04-20 DIAGNOSIS — I5032 Chronic diastolic (congestive) heart failure: Secondary | ICD-10-CM | POA: Diagnosis not present

## 2020-04-20 DIAGNOSIS — N183 Chronic kidney disease, stage 3 unspecified: Secondary | ICD-10-CM | POA: Diagnosis not present

## 2020-04-20 DIAGNOSIS — J1282 Pneumonia due to coronavirus disease 2019: Secondary | ICD-10-CM | POA: Diagnosis not present

## 2020-04-20 NOTE — Assessment & Plan Note (Signed)
Reports improved SOB and fatigue. Improved oxygen saturation on room air Persistent poor appetite and 8lbs weight loss in last 54month. Husband reports she does not eat enough. Reports she is able to complete home PT and OT with intermittent breaks.  Advised to supplement meals with Boost BID No repeat CXR needed at this time.

## 2020-04-21 ENCOUNTER — Telehealth: Payer: Self-pay | Admitting: Nurse Practitioner

## 2020-04-21 DIAGNOSIS — J1282 Pneumonia due to coronavirus disease 2019: Secondary | ICD-10-CM | POA: Diagnosis not present

## 2020-04-21 DIAGNOSIS — U071 COVID-19: Secondary | ICD-10-CM | POA: Diagnosis not present

## 2020-04-21 DIAGNOSIS — N183 Chronic kidney disease, stage 3 unspecified: Secondary | ICD-10-CM | POA: Diagnosis not present

## 2020-04-21 DIAGNOSIS — I130 Hypertensive heart and chronic kidney disease with heart failure and stage 1 through stage 4 chronic kidney disease, or unspecified chronic kidney disease: Secondary | ICD-10-CM | POA: Diagnosis not present

## 2020-04-21 DIAGNOSIS — I5032 Chronic diastolic (congestive) heart failure: Secondary | ICD-10-CM | POA: Diagnosis not present

## 2020-04-21 DIAGNOSIS — J9621 Acute and chronic respiratory failure with hypoxia: Secondary | ICD-10-CM | POA: Diagnosis not present

## 2020-04-21 NOTE — Telephone Encounter (Signed)
Verbal orders given  

## 2020-04-21 NOTE — Telephone Encounter (Signed)
[name] is calling requesting verbal orders to continue home PT orders for 1 week 9, please advise. CB [phone]

## 2020-04-25 DIAGNOSIS — I429 Cardiomyopathy, unspecified: Secondary | ICD-10-CM | POA: Diagnosis not present

## 2020-04-25 DIAGNOSIS — Z86711 Personal history of pulmonary embolism: Secondary | ICD-10-CM | POA: Diagnosis not present

## 2020-04-25 DIAGNOSIS — I4819 Other persistent atrial fibrillation: Secondary | ICD-10-CM | POA: Diagnosis not present

## 2020-04-25 DIAGNOSIS — L409 Psoriasis, unspecified: Secondary | ICD-10-CM | POA: Diagnosis not present

## 2020-04-25 DIAGNOSIS — J45909 Unspecified asthma, uncomplicated: Secondary | ICD-10-CM | POA: Diagnosis not present

## 2020-04-25 DIAGNOSIS — D500 Iron deficiency anemia secondary to blood loss (chronic): Secondary | ICD-10-CM | POA: Diagnosis not present

## 2020-04-25 DIAGNOSIS — Z8616 Personal history of COVID-19: Secondary | ICD-10-CM | POA: Diagnosis not present

## 2020-04-25 DIAGNOSIS — Z7952 Long term (current) use of systemic steroids: Secondary | ICD-10-CM | POA: Diagnosis not present

## 2020-04-25 DIAGNOSIS — Z7951 Long term (current) use of inhaled steroids: Secondary | ICD-10-CM | POA: Diagnosis not present

## 2020-04-25 DIAGNOSIS — N2581 Secondary hyperparathyroidism of renal origin: Secondary | ICD-10-CM | POA: Diagnosis not present

## 2020-04-25 DIAGNOSIS — Z8673 Personal history of transient ischemic attack (TIA), and cerebral infarction without residual deficits: Secondary | ICD-10-CM | POA: Diagnosis not present

## 2020-04-25 DIAGNOSIS — I130 Hypertensive heart and chronic kidney disease with heart failure and stage 1 through stage 4 chronic kidney disease, or unspecified chronic kidney disease: Secondary | ICD-10-CM | POA: Diagnosis not present

## 2020-04-25 DIAGNOSIS — D631 Anemia in chronic kidney disease: Secondary | ICD-10-CM | POA: Diagnosis not present

## 2020-04-25 DIAGNOSIS — F32A Depression, unspecified: Secondary | ICD-10-CM | POA: Diagnosis not present

## 2020-04-25 DIAGNOSIS — Z9181 History of falling: Secondary | ICD-10-CM | POA: Diagnosis not present

## 2020-04-25 DIAGNOSIS — I739 Peripheral vascular disease, unspecified: Secondary | ICD-10-CM | POA: Diagnosis not present

## 2020-04-25 DIAGNOSIS — Z8701 Personal history of pneumonia (recurrent): Secondary | ICD-10-CM | POA: Diagnosis not present

## 2020-04-25 DIAGNOSIS — I5032 Chronic diastolic (congestive) heart failure: Secondary | ICD-10-CM | POA: Diagnosis not present

## 2020-04-25 DIAGNOSIS — I081 Rheumatic disorders of both mitral and tricuspid valves: Secondary | ICD-10-CM | POA: Diagnosis not present

## 2020-04-25 DIAGNOSIS — G4733 Obstructive sleep apnea (adult) (pediatric): Secondary | ICD-10-CM | POA: Diagnosis not present

## 2020-04-25 DIAGNOSIS — M1030 Gout due to renal impairment, unspecified site: Secondary | ICD-10-CM | POA: Diagnosis not present

## 2020-04-25 DIAGNOSIS — N183 Chronic kidney disease, stage 3 unspecified: Secondary | ICD-10-CM | POA: Diagnosis not present

## 2020-04-25 DIAGNOSIS — F419 Anxiety disorder, unspecified: Secondary | ICD-10-CM | POA: Diagnosis not present

## 2020-04-26 DIAGNOSIS — D631 Anemia in chronic kidney disease: Secondary | ICD-10-CM | POA: Diagnosis not present

## 2020-04-26 DIAGNOSIS — D500 Iron deficiency anemia secondary to blood loss (chronic): Secondary | ICD-10-CM | POA: Diagnosis not present

## 2020-04-26 DIAGNOSIS — I130 Hypertensive heart and chronic kidney disease with heart failure and stage 1 through stage 4 chronic kidney disease, or unspecified chronic kidney disease: Secondary | ICD-10-CM | POA: Diagnosis not present

## 2020-04-26 DIAGNOSIS — N183 Chronic kidney disease, stage 3 unspecified: Secondary | ICD-10-CM | POA: Diagnosis not present

## 2020-04-26 DIAGNOSIS — I5032 Chronic diastolic (congestive) heart failure: Secondary | ICD-10-CM | POA: Diagnosis not present

## 2020-04-26 DIAGNOSIS — I4819 Other persistent atrial fibrillation: Secondary | ICD-10-CM | POA: Diagnosis not present

## 2020-04-27 DIAGNOSIS — Z940 Kidney transplant status: Secondary | ICD-10-CM | POA: Diagnosis not present

## 2020-05-02 ENCOUNTER — Other Ambulatory Visit: Payer: Self-pay

## 2020-05-02 ENCOUNTER — Ambulatory Visit (INDEPENDENT_AMBULATORY_CARE_PROVIDER_SITE_OTHER): Payer: Medicare Other

## 2020-05-02 DIAGNOSIS — I130 Hypertensive heart and chronic kidney disease with heart failure and stage 1 through stage 4 chronic kidney disease, or unspecified chronic kidney disease: Secondary | ICD-10-CM | POA: Diagnosis not present

## 2020-05-02 DIAGNOSIS — I5032 Chronic diastolic (congestive) heart failure: Secondary | ICD-10-CM | POA: Diagnosis not present

## 2020-05-02 DIAGNOSIS — Z23 Encounter for immunization: Secondary | ICD-10-CM | POA: Diagnosis not present

## 2020-05-02 DIAGNOSIS — N183 Chronic kidney disease, stage 3 unspecified: Secondary | ICD-10-CM | POA: Diagnosis not present

## 2020-05-02 DIAGNOSIS — D500 Iron deficiency anemia secondary to blood loss (chronic): Secondary | ICD-10-CM | POA: Diagnosis not present

## 2020-05-02 DIAGNOSIS — D631 Anemia in chronic kidney disease: Secondary | ICD-10-CM | POA: Diagnosis not present

## 2020-05-02 DIAGNOSIS — I4819 Other persistent atrial fibrillation: Secondary | ICD-10-CM | POA: Diagnosis not present

## 2020-05-02 NOTE — Patient Instructions (Signed)
Health Maintenance Due  Topic Date Due  . INFLUENZA VACCINE  02/14/2020    Depression screen [facility] 2/9 12/29/2019 11/05/2019 05/29/2019  Decreased Interest 1 1 2   Down, Depressed, Hopeless 1 2 1   PHQ - 2 Score 2 3 3   Altered sleeping 1 1 2   Tired, decreased energy 1 3 2   Change in appetite 3 3 3   Feeling bad or failure about yourself  0 3 1  Trouble concentrating 1 0 0  Moving slowly or fidgety/restless 2 3 1   Suicidal thoughts 0 0 0  PHQ-9 Score 10 16 12   Difficult doing work/chores Somewhat difficult Somewhat difficult -  Some recent data might be hidden

## 2020-05-02 NOTE — Progress Notes (Signed)
Per orders of [name]  injection of Influenza Vaccine given by Skylar Flynt L Elisheva Fallas in right deltoid. Patient tolerated injection well.

## 2020-05-03 DIAGNOSIS — N183 Chronic kidney disease, stage 3 unspecified: Secondary | ICD-10-CM | POA: Diagnosis not present

## 2020-05-03 DIAGNOSIS — Z45018 Encounter for adjustment and management of other part of cardiac pacemaker: Secondary | ICD-10-CM | POA: Diagnosis not present

## 2020-05-03 DIAGNOSIS — Z4509 Encounter for adjustment and management of other cardiac device: Secondary | ICD-10-CM | POA: Diagnosis not present

## 2020-05-03 DIAGNOSIS — D500 Iron deficiency anemia secondary to blood loss (chronic): Secondary | ICD-10-CM | POA: Diagnosis not present

## 2020-05-03 DIAGNOSIS — I5032 Chronic diastolic (congestive) heart failure: Secondary | ICD-10-CM | POA: Diagnosis not present

## 2020-05-03 DIAGNOSIS — D631 Anemia in chronic kidney disease: Secondary | ICD-10-CM | POA: Diagnosis not present

## 2020-05-03 DIAGNOSIS — I4891 Unspecified atrial fibrillation: Secondary | ICD-10-CM | POA: Diagnosis not present

## 2020-05-03 DIAGNOSIS — I130 Hypertensive heart and chronic kidney disease with heart failure and stage 1 through stage 4 chronic kidney disease, or unspecified chronic kidney disease: Secondary | ICD-10-CM | POA: Diagnosis not present

## 2020-05-03 DIAGNOSIS — I4819 Other persistent atrial fibrillation: Secondary | ICD-10-CM | POA: Diagnosis not present

## 2020-05-04 ENCOUNTER — Ambulatory Visit (INDEPENDENT_AMBULATORY_CARE_PROVIDER_SITE_OTHER): Payer: Medicare Other | Admitting: Psychology

## 2020-05-04 DIAGNOSIS — F4322 Adjustment disorder with anxiety: Secondary | ICD-10-CM

## 2020-05-05 ENCOUNTER — Telehealth: Payer: Self-pay | Admitting: Pulmonary Disease

## 2020-05-05 ENCOUNTER — Ambulatory Visit (INDEPENDENT_AMBULATORY_CARE_PROVIDER_SITE_OTHER): Payer: Medicare Other

## 2020-05-05 VITALS — Ht 65.0 in | Wt 134.0 lb

## 2020-05-05 DIAGNOSIS — Z0000 Encounter for general adult medical examination without abnormal findings: Secondary | ICD-10-CM

## 2020-05-05 DIAGNOSIS — Z780 Asymptomatic menopausal state: Secondary | ICD-10-CM

## 2020-05-05 NOTE — Progress Notes (Signed)
Subjective:   [name] is a 74 y.o. female who presents for Medicare Annual (Subsequent) preventive examination.  I connected with Brienne today by telephone and verified that I am speaking with the correct person using two identifiers. Location patient: home Location provider: work Persons participating in the virtual visit: patient, Marine scientist.    I discussed the limitations, risks, security and privacy concerns of performing an evaluation and management service by telephone and the availability of in person appointments. I also discussed with the patient that there may be a patient responsible charge related to this service. The patient expressed understanding and verbally consented to this telephonic visit.    Interactive audio and video telecommunications were attempted between this provider and patient, however failed, due to patient having technical difficulties OR patient did not have access to video capability.  We continued and completed visit with audio only.  Some vital signs may be absent or patient reported.   Time Spent with patient on telephone encounter: 30 minutes   Review of Systems     Cardiac Risk Factors include: advanced age (>81men, >82 women);hypertension     Objective:    Today's Vitals   05/05/20 0952  Weight: 134 lb (60.8 kg)  Height: 5\' 5"  (1.651 m)   Body mass index is 22.3 kg/m.  Advanced Directives 05/05/2020 03/11/2020 11/16/2019 11/16/2019 11/14/2019 11/05/2019 10/22/2019  Does Patient Have a Medical Advance Directive? Yes No Yes Yes No Yes Yes  Type of Paramedic of [place];Living will - [place];Living will [place];Living will - [place];Living will [place];Living will  Does patient want to make changes to medical advance directive? - - No - Patient declined - - Yes (MAU/Ambulatory/Procedural Areas - Information given) No - Patient declined  Copy  of [place] in Chart? No - copy requested - No - copy requested No - copy requested - No - copy requested No - copy requested  Would patient like information on creating a medical advance directive? - No - Patient declined - - - - -  Pre-existing out of facility DNR order (yellow form or pink MOST form) - - - - - - -    Current Medications (verified) Outpatient Encounter Medications as of 05/05/2020  Medication Sig  . acetaminophen (TYLENOL) 500 MG tablet Take 500 mg by mouth every 6 (six) hours as needed for mild pain or headache.   . albuterol (PROVENTIL) (2.5 MG/3ML) 0.083% nebulizer solution Take 3 mLs (2.5 mg total) by nebulization every 6 (six) hours as needed for wheezing or shortness of breath. Dx:J45.909  . budesonide-formoterol (SYMBICORT) 160-4.5 MCG/ACT inhaler Inhale 2 puffs into the lungs 2 (two) times daily.  . calcitRIOL (ROCALTROL) 0.25 MCG capsule Take 0.25 mcg by mouth every other day.  . carvedilol (COREG) 12.5 MG tablet Take 2 tablets (25 mg total) by mouth 2 (two) times daily.  Marland Kitchen docusate sodium (COLACE) 100 MG capsule Take 100 mg by mouth daily.   . DULoxetine (CYMBALTA) 30 MG capsule TAKE 1 CAPSULE BY MOUTH EVERY DAY (Patient taking differently: Take 30 mg by mouth daily. )  . Ferrous Sulfate (IRON PO) Take 325 mg by mouth daily.   . furosemide (LASIX) 80 MG tablet Take 1 tablet (80 mg total) by mouth 2 (two) times daily. (Patient taking differently: Take 160 mg by mouth 2 (two) times daily. )  . mycophenolate (MYFORTIC) 180 MG EC tablet Take 180 mg  by mouth 2 (two) times daily.  . ondansetron (ZOFRAN) 4 MG tablet Take 1 tablet (4 mg total) by mouth every 6 (six) hours as needed for nausea.  . potassium chloride SA (K-DUR,KLOR-CON) 20 MEQ tablet Take 20 mEq by mouth daily.   . predniSONE (DELTASONE) 5 MG tablet Take 1 tablet (5 mg total) by mouth daily.  . tacrolimus (PROGRAF) 1 MG capsule Take 2 mg by mouth 2 (two) times daily.   . VENTOLIN HFA 108  (90 Base) MCG/ACT inhaler Inhale 2 puffs into the lungs every 4 (four) hours as needed for wheezing or shortness of breath.    No facility-administered encounter medications on file as of 05/05/2020.    Allergies (verified) Ace inhibitors, Amiodarone, Amlodipine, Warfarin, and Warfarin sodium   History: Past Medical History:  Diagnosis Date  . Acute diastolic (congestive) heart failure ([place]) 08/12/2017  . AKI (acute kidney injury) ([place]) 11/04/2017  . Anemia   . Anxiety   . Arthritis    "shoulders; arms; hips" (02/04/2018)  . Asthma   . Atrial fibrillation ([place])   . Atrial flutter [facility])    s/p aflutter ablation at [facility]  . Benign hypertension with ESRD (end-stage renal disease) ([place])   . Blood transfusion    never had a reaction to blood transfusion  . Cardiomyopathy Mar 2013   Mild, EF 50-55% by Mar 2013 ECHO, diast dysfxn II  . Cardiomyopathy ([place])   . CHF (congestive heart failure) ([place]) 2005  . CHF (congestive heart failure) ([place])   . Childhood asthma   . Closed fracture of right distal radius 10/18/2016  . CMV (cytomegalovirus infection) ([place]) 10/03/2015  . Constipation    takes Miralax daily  . Constipation   . Depression    takes Zoloft daily  . Distal radius fracture, right   . DVT of lower extremity, bilateral ([place]) 12/21/11   "they're there now; been there for 2 wks"  . Eczema   . ESRD (end stage renal disease) on dialysis [facility]) 09/2011   07-19-2015 had Kidney transplant at [facility]; "don't get dialysis anymore" (02/04/2018)  . Fractures, stress    in both feet--6 OR 7 YRS AGO--RESOLVED  . Generalized edema [id number]  . Gout    doesn't require meds   . HCAP (healthcare-associated pneumonia) 09/10/2011  . Hearing difficulty   . High cholesterol   . History of CVA (cerebrovascular accident) without residual deficits   . History of hip replacement, total, right   . History of pneumonia   . Hx of Clostridium difficile infection   . Hypercalcemia    09/10/11  .  Hyperparathyroidism ([place]) 04/07/2013  . Hypertension    takes Diltiazem daily   . Hypomagnesemia 08/02/2015  . Hypophosphatemia 11/08/2017  . ICB (intracranial bleed) ([place])   . Immunosuppression ([place]) 07/25/2015  . Memory changes   . Morbid obesity ([place])   . Nonischemic dilated cardiomyopathy ([place])   . Obesity 04/07/2013  . Oligouria   . OSA on CPAP   . PAF (paroxysmal atrial fibrillation) ([facility])     HX OF CEREBRAL BLEED WHILE ON COUMADIN-SO PT NOT ON ANY BLOOD THINNERS NOW  . Peripheral vascular disease ([place])   . Presence of Watchman left atrial appendage closure device 10/04/2017  . Proteinuria 09/04/2016  . Psoriasis 08/07/2016  . 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 S/P insertion of IVC (inferior vena caval) filter   . Sepsis ([place]) 11/08/2017  . Stress fracture    bilateral feet  . Stroke [facility]) 2009  denies residual on 02/04/2018;  hemorrhagic now off coumadin  . Stroke due to intracerebral hemorrhage ([place]) 2009  . Suture reaction 09/04/2016  . Transfusion history   . Use of cane as ambulatory aid    Past Surgical History:  Procedure Laterality Date  . AV FISTULA PLACEMENT  09/28/2011   Procedure: ARTERIOVENOUS (AV) FISTULA CREATION;  Surgeon: Rosetta Posner, MD;  Location: [place];  Service: Vascular;  Laterality: Left;  . CARDIAC CATHETERIZATION    . CARDIAC VALVE SURGERY  1960    FOR ATRIAL SEPTAL DEFECT  . CHOLECYSTECTOMY     1980's  . COLONOSCOPY    . COLONOSCOPY WITH PROPOFOL N/A 11/16/2019   Procedure: COLONOSCOPY WITH PROPOFOL;  Surgeon: Juanita Craver, MD;  Location: [facility] ENDOSCOPY;  Service: Endoscopy;  Laterality: N/A;  . CYSTOSCOPY     many yrs ago  . ESOPHAGOGASTRODUODENOSCOPY (EGD) WITH PROPOFOL N/A 11/16/2019   Procedure: ESOPHAGOGASTRODUODENOSCOPY (EGD) WITH PROPOFOL;  Surgeon: Juanita Craver, MD;  Location: [facility] END[facility]COPY;  Service: Endoscopy;  Laterality: N/A;  . FEMUR FRACTURE SURGERY Right 03/2011; 09/03/2011   "had 2, 2 wk apart in 2012; broke it again 08/2011  & had OR"  . FRACTURE SURGERY    . I & D EXTREMITY  09/15/2011   Procedure: IRRIGATION AND DEBRIDEMENT EXTREMITY w REMOVAL OF HARDWARE;  Surgeon: [name], MD;  Location: [place];  Service: Orthopedics;  Laterality: Right;  . INCISION AND DRAINAGE HIP  12/21/2011   Procedure: IRRIGATION AND DEBRIDEMENT HIP;  Surgeon: [name], MD;  Location: [place];  Service: Orthopedics;  Laterality: Right;  I&D RIGHT HIP WITH PLACEMENT ANTIBIOTIC SPACER  . INSERTION OF DIALYSIS CATHETER     Procedure: INSERTION OF DIALYSIS CATHETER;  Surgeon: Rosetta Posner, MD;  Location: [place];  Service: Vascular;  Laterality: Right;  . IRRIGATION AND DEBRIDEMENT ABSCESS  12/21/11   right hip  . JOINT REPLACEMENT    . KIDNEY TRANSPLANT  07/19/15  . LAPAROSCOPIC GASTRIC BANDING  2008  . LEFT ATRIAL APPENDAGE OCCLUSION  10/04/2017  . OPEN REDUCTION INTERNAL FIXATION (ORIF) DISTAL RADIAL FRACTURE Right 10/18/2016   Procedure: OPEN REDUCTION INTERNAL FIXATION (ORIF) RIGHT DISTAL RADIAL FRACTURE;  Surgeon: Marchia Bond, MD;  Location: [place];  Service: Orthopedics;  Laterality: Right;  . TOTAL HIP ARTHROPLASTY Right 02/2011   right THA 02/2011, I&D/removal of hardware 09/2011,, repeat I&D Jun 2013, reimplantation R THA 03-26-2012  . TOTAL HIP REVISION  03/25/2012   Procedure: TOTAL HIP REVISION;  Surgeon: [name], MD;  Location: [facility];  Service: Orthopedics;  Laterality: Right;  Right Total Hip Reimplantation  . TUBAL LIGATION    . VENA CAVA FILTER PLACEMENT  11/2011   Family History  Problem Relation Age of Onset  . Cancer Father   . Diabetes Mother   . Hypertension Mother   . Arthritis Mother   . Hodgkin's lymphoma Other 32       dscd---HODGKINS DISEASE  . Hypertension Brother    Social History   Socioeconomic History  . Marital status: Married    Spouse name: Not on file  . Number of children: Not on file  . Years of education: Not on file  . Highest education level: Not on file    Occupational History  . Occupation: retired  Tobacco Use  . Smoking status: Never Smoker  . Smokeless tobacco: Never Used  Vaping Use  . Vaping Use: Never used  Substance and Sexual Activity  . Alcohol  use: Never  . Drug use: Never  . Sexual activity: Yes    Birth control/protection: Post-menopausal  Other Topics Concern  . Not on file  Social History Narrative   Married; retired; lives in [place] Strain: High Risk  . Difficulty of Paying Living Expenses: Hard  Food Insecurity: No Food Insecurity  . Worried About Charity fundraiser in the Last Year: Never true  . Ran Out of Food in the Last Year: Never true  Transportation Needs: No Transportation Needs  . Lack of Transportation (Medical): No  . Lack of Transportation (Non-Medical): No  Physical Activity: Insufficiently Active  . Days of Exercise per Week: 2 days  . Minutes of Exercise per Session: 60 min  Stress: Stress Concern Present  . Feeling of Stress : To some extent  Social Connections: Socially Integrated  . Frequency of Communication with Friends and Family: More than three times a week  . Frequency of Social Gatherings with Friends and Family: Once a week  . Attends Religious Services: 1 to 4 times per year  . Active Member of Clubs or Organizations: Yes  . Attends Archivist Meetings: 1 to 4 times per year  . Marital Status: Married    Tobacco Counseling Counseling given: Not Answered   Clinical Intake:  Pre-visit preparation completed: Yes  Pain : No/denies pain     Nutritional Status: BMI of 19-24  Normal Nutritional Risks: None Diabetes: No  How often do you need to have someone help you when you read instructions, pamphlets, or other written materials from your doctor or pharmacy?: 1 - Never What is the last grade level you completed in school?: Master's degree  Diabetic?No  Interpreter Needed?: No  Information entered  by :: Caroleen Hamman LPN   Activities of Daily Living In your present state of health, do you have any difficulty performing the following activities: 05/05/2020 03/11/2020  Hearing? N N  Vision? N N  Difficulty concentrating or making decisions? Y Y  Comment occasionally forgets appt's, has to write things down sometimes -  Walking or climbing stairs? N Y  Comment - secondary to weakness  Dressing or bathing? N N  Doing errands, shopping? N Y  Conservation officer, nature and eating ? N -  Using the Toilet? N -  In the past six months, have you accidently leaked urine? Y -  Comment wears a pad -  Do you have problems with loss of bowel control? N -  Managing your Medications? N -  Managing your Finances? N -  Housekeeping or managing your Housekeeping? N -  Some recent data might be hidden    Patient Care Team: [name], [name], NP as PCP - General (Internal Medicine) Bensimhon, Shaune Pascal, MD as PCP - Advanced Heart Failure (Cardiology) Sueanne Margarita, MD as Consulting Physician (Cardiology) Paralee Cancel, MD as Consulting Physician (Orthopedic Surgery) Fleet Contras, MD as Consulting Physician (Nephrology) Obgyn, Myles Lipps, Glean Salvo, [facility] as Pharmacist (Pharmacist)  Indicate any recent Medical Services you may have received from other than [facility] providers in the past year (date may be approximate).     Assessment:   This is a routine wellness examination for Stela.  Hearing/Vision screen  Hearing Screening   125Hz  250Hz  500Hz  1000Hz  2000Hz  3000Hz  4000Hz  6000Hz  8000Hz   Right ear:           Left ear:  Comments: No hearing loss  Vision Screening Comments: Reading glasses Last eye exam-2020  Dietary issues and exercise activities discussed: Current Exercise Habits: Home exercise routine, Type of exercise: Other - see comments (physical therapy), Time (Minutes): 60, Frequency (Times/Week): 2, Weekly Exercise (Minutes/Week): 120, Intensity: Mild, Exercise  limited by: respiratory conditions(s) (recovering from pneumonia)  Goals    . Chronic Care Management     CARE PLAN ENTRY  Current Barriers:  . Chronic Disease Management support, education, and care coordination needs related to Hypertension, atrial fibrillation, heart failure, asthma, COPD, and renal transplan  Clinical Goal(s): Over the next 30 days, patient will:  . Work with the care management team to address educational, disease management, and care coordination needs  . Begin or continue self health monitoring activities as directed today  . Call provider office for new or worsened signs and symptoms  . Call care management team with questions or concerns . Maintain blood pressure less than 130/80  . Prevent future asthma and COPD exacerbations  . Maintain stable weight   Interventions:  . Evaluation of current treatment plans and patient's adherence to plan as established by provider . Assessed patient understanding of disease states . Assessed patient's education and care coordination needs . Provided disease specific education to patient   Patient Self Care Activities:  . Patient agrees to weigh herself daily and record the results  . Patient agrees to monitor her blood pressure daily and record the results  . Patient agrees to call the [facility] counselors [phone]) to see if she qualifies for the Extra Help Program . Patient will be sure to rinse her mouth after every time she uses her inhaler.   Face to Face appointment with pharmacist scheduled for:  11/17/19 at 9:30 AM     . Patient Stated     Be more active      Depression Screen PHQ 2/9 Scores 05/05/2020 12/29/2019 11/05/2019 05/29/2019 11/25/2018 07/01/2018 06/23/2018  PHQ - 2 Score 1 2 3 3  0 3 2  PHQ- 9 Score - 10 16 12 2 13  -    Fall Risk Fall Risk  05/05/2020 05/29/2019 06/23/2018 12/13/2017 09/18/2016  Falls in the past year? 0 0 1 Yes Yes  Number falls in past yr: 0 - 1 2 or more 1  Injury with Fall? 0 -  0 No No  Risk for fall due to : - - - Impaired balance/gait History of fall(s);Impaired balance/gait;Impaired mobility  Risk for fall due to: Comment - - - - -  Follow up Falls prevention discussed - - Falls evaluation completed -    Any stairs in or around the home? Yes  If so, are there any without handrails? No  Home free of loose throw rugs in walkways, pet beds, electrical cords, etc? Yes  Adequate lighting in your home to reduce risk of falls? Yes   ASSISTIVE DEVICES UTILIZED TO PREVENT FALLS:  Life alert? No  Use of a cane, walker or w/c? Yes  Grab bars in the bathroom? Yes  Shower chair or bench in shower? No  Elevated toilet seat or a handicapped toilet? No   TIMED UP AND GO:  Was the test performed? No . Phone visit   Cognitive Function:No cognitive impairment noted MMSE - Mini Mental State Exam 09/18/2016  Orientation to time 5  Orientation to Place 5  Registration 3  Attention/ Calculation 5  Recall 3  Language- name 2 objects 2  Language- repeat 1  Language- follow 3  step command 3  Language- read & follow direction 1  Write a sentence 1  Copy design 0  Total score 29        Immunizations Immunization History  Administered Date(s) Administered  . Fluad Quad(high Dose 65+) 04/28/2019, 05/02/2020  . Influenza Split 03/27/2012  . Influenza Whole 04/29/2008  . Influenza, High Dose Seasonal PF 04/19/2017, 03/16/2018  . Influenza,inj,Quad PF,6+ Mos 04/24/2013  . Influenza-Unspecified 04/29/2014, 03/17/2015, 04/18/2016  . PFIZER SARS-COV-2 Vaccination 09/09/2019, 09/23/2019  . Pneumococcal Conjugate-13 03/02/2014  . Pneumococcal Polysaccharide-23 04/25/2005, 09/18/2016  . Tdap 05/08/2013    TDAP status: Up to date   Flu Vaccine status: Up to date   Pneumococcal vaccine status: Up to date   Covid-19 vaccine status: Completed vaccines  Qualifies for Shingles Vaccine? Yes   Zostavax completed No   Shingrix Completed?: No.    Education has been  provided regarding the importance of this vaccine. Patient has been advised to call insurance company to determine out of pocket expense if they have not yet received this vaccine. Advised may also receive vaccine at local pharmacy or Health Dept. Verbalized acceptance and understanding.  Screening Tests Health Maintenance  Topic Date Due  . MAMMOGRAM  03/08/2022  . TETANUS/TDAP  05/09/2023  . COLONOSCOPY  11/15/2029  . INFLUENZA VACCINE  Completed  . DEXA SCAN  Completed  . COVID-19 Vaccine  Completed  . Hepatitis C Screening  Completed  . PNA vac Low Risk Adult  Completed    Health Maintenance  There are no preventive care reminders to display for this patient.  Colorectal cancer screening: Completed 11/16/2019. Repeat every 10 years   Mammogram status: Completed 03/08/2020. Repeat every year   Bone Density status: Ordered today. Pt provided with contact info and advised to call to schedule appt.  Lung Cancer Screening: (Low Dose CT Chest recommended if Age 9-80 years, 30 pack-year currently smoking OR have quit w/in 15years.) does not qualify.     Additional Screening:  Hepatitis C Screening: Completed 09/11/2011  Vision Screening: Recommended annual ophthalmology exams for early detection of glaucoma and other disorders of the eye. Is the patient up to date with their annual eye exam?  Yes  Who is the provider or what is the name of the office in which the patient attends annual eye exams? Unsure of name   Dental Screening: Recommended annual dental exams for proper oral hygiene  Community Resource Referral / Chronic Care Management: CRR required this visit?  No   CCM required this visit?  No      Plan:     I have personally reviewed and noted the following in the patient's chart:   . Medical and social history . Use of alcohol, tobacco or illicit drugs  . Current medications and supplements . Functional ability and status . Nutritional status . Physical  activity . Advanced directives . List of other physicians . Hospitalizations, surgeries, and ER visits in previous 12 months . Vitals . Screenings to include cognitive, depression, and falls . Referrals and appointments  In addition, I have reviewed and discussed with patient certain preventive protocols, quality metrics, and best practice recommendations. A written personalized care plan for preventive services as well as general preventive health recommendations were provided to patient.   Due to this being a telephonic visit, the after visit summary with patients personalized plan was offered to patient via mail or my-chart.  Patient would like to access on my-chart.   Marta Antu, LPN  05/05/2020  Nurse Health Advisor  Nurse Notes: None

## 2020-05-05 NOTE — Patient Instructions (Signed)
[name] , Thank you for taking time to complete your Medicare Wellness Visit. I appreciate your ongoing commitment to your health goals. Please review the following plan we discussed and let me know if I can assist you in the future.   Screening recommendations/referrals: Colonoscopy: Completed 11/16/2019- Not required after age 72 Mammogram: Completed 03/08/2020-Due 03/08/2021 Bone Density: Ordered today. Someone will be calling you to schedule. Recommended yearly ophthalmology/optometry visit for glaucoma screening and checkup Recommended yearly dental visit for hygiene and checkup  Vaccinations: Influenza vaccine: Up to Date Pneumococcal vaccine: Completed vaccines Tdap vaccine: Up to Date-Due-05/09/2023 Shingles vaccine: Discuss with pharmacy   Covid-19:Completed vaccines  Advanced directives: Please bring a copy for your chart  Conditions/risks identified: See problem list  Next appointment: Follow up in one year for your annual wellness visit 05/09/2021 @ 1:30pm   Preventive Care 65 Years and Older, Female Preventive care refers to lifestyle choices and visits with your health care provider that can promote health and wellness. What does preventive care include?  A yearly physical exam. This is also called an annual well check.  Dental exams once or twice a year.  Routine eye exams. Ask your health care provider how often you should have your eyes checked.  Personal lifestyle choices, including:  Daily care of your teeth and gums.  Regular physical activity.  Eating a healthy diet.  Avoiding tobacco and drug use.  Limiting alcohol use.  Practicing safe sex.  Taking low-dose aspirin every day.  Taking vitamin and mineral supplements as recommended by your health care provider. What happens during an annual well check? The services and screenings done by your health care provider during your annual well check will depend on your age, overall health, lifestyle  risk factors, and family history of disease. Counseling  Your health care provider may ask you questions about your:  Alcohol use.  Tobacco use.  Drug use.  Emotional well-being.  Home and relationship well-being.  Sexual activity.  Eating habits.  History of falls.  Memory and ability to understand (cognition).  Work and work Statistician.  Reproductive health. Screening  You may have the following tests or measurements:  Height, weight, and BMI.  Blood pressure.  Lipid and cholesterol levels. These may be checked every 5 years, or more frequently if you are over 40 years old.  Skin check.  Lung cancer screening. You may have this screening every year starting at age 19 if you have a 30-pack-year history of smoking and currently smoke or have quit within the past 15 years.  Fecal occult blood test (FOBT) of the stool. You may have this test every year starting at age 23.  Flexible sigmoidoscopy or colonoscopy. You may have a sigmoidoscopy every 5 years or a colonoscopy every 10 years starting at age 47.  Hepatitis C blood test.  Hepatitis B blood test.  Sexually transmitted disease (STD) testing.  Diabetes screening. This is done by checking your blood sugar (glucose) after you have not eaten for a while (fasting). You may have this done every 1-3 years.  Bone density scan. This is done to screen for osteoporosis. You may have this done starting at age 50.  Mammogram. This may be done every 1-2 years. Talk to your health care provider about how often you should have regular mammograms. Talk with your health care provider about your test results, treatment options, and if necessary, the need for more tests. Vaccines  Your health care provider may recommend certain vaccines, such  as:  Influenza vaccine. This is recommended every year.  Tetanus, diphtheria, and acellular pertussis (Tdap, Td) vaccine. You may need a Td booster every 10 years.  Zoster vaccine.  You may need this after age 56.  Pneumococcal 13-valent conjugate (PCV13) vaccine. One dose is recommended after age 73.  Pneumococcal polysaccharide (PPSV23) vaccine. One dose is recommended after age 63. Talk to your health care provider about which screenings and vaccines you need and how often you need them. This information is not intended to replace advice given to you by your health care provider. Make sure you discuss any questions you have with your health care provider. Document Released: 07/29/2015 Document Revised: 03/21/2016 Document Reviewed: 05/03/2015 Elsevier Interactive Patient Education  2017 [place] Prevention in the Home Falls can cause injuries. They can happen to people of all ages. There are many things you can do to make your home safe and to help prevent falls. What can I do on the outside of my home?  Regularly fix the edges of walkways and driveways and fix any cracks.  Remove anything that might make you trip as you walk through a door, such as a raised step or threshold.  Trim any bushes or trees on the path to your home.  Use bright outdoor lighting.  Clear any walking paths of anything that might make someone trip, such as rocks or tools.  Regularly check to see if handrails are loose or broken. Make sure that both sides of any steps have handrails.  Any raised decks and porches should have guardrails on the edges.  Have any leaves, snow, or ice cleared regularly.  Use sand or salt on walking paths during winter.  Clean up any spills in your garage right away. This includes oil or grease spills. What can I do in the bathroom?  Use night lights.  Install grab bars by the toilet and in the tub and shower. Do not use towel bars as grab bars.  Use non-skid mats or decals in the tub or shower.  If you need to sit down in the shower, use a plastic, non-slip stool.  Keep the floor dry. Clean up any water that spills on the floor as soon  as it happens.  Remove soap buildup in the tub or shower regularly.  Attach bath mats securely with double-sided non-slip rug tape.  Do not have throw rugs and other things on the floor that can make you trip. What can I do in the bedroom?  Use night lights.  Make sure that you have a light by your bed that is easy to reach.  Do not use any sheets or blankets that are too big for your bed. They should not hang down onto the floor.  Have a firm chair that has side arms. You can use this for support while you get dressed.  Do not have throw rugs and other things on the floor that can make you trip. What can I do in the kitchen?  Clean up any spills right away.  Avoid walking on wet floors.  Keep items that you use a lot in easy-to-reach places.  If you need to reach something above you, use a strong step stool that has a grab bar.  Keep electrical cords out of the way.  Do not use floor polish or wax that makes floors slippery. If you must use wax, use non-skid floor wax.  Do not have throw rugs and other things on the  floor that can make you trip. What can I do with my stairs?  Do not leave any items on the stairs.  Make sure that there are handrails on both sides of the stairs and use them. Fix handrails that are broken or loose. Make sure that handrails are as long as the stairways.  Check any carpeting to make sure that it is firmly attached to the stairs. Fix any carpet that is loose or worn.  Avoid having throw rugs at the top or bottom of the stairs. If you do have throw rugs, attach them to the floor with carpet tape.  Make sure that you have a light switch at the top of the stairs and the bottom of the stairs. If you do not have them, ask someone to add them for you. What else can I do to help prevent falls?  Wear shoes that:  Do not have high heels.  Have rubber bottoms.  Are comfortable and fit you well.  Are closed at the toe. Do not wear sandals.  If  you use a stepladder:  Make sure that it is fully opened. Do not climb a closed stepladder.  Make sure that both sides of the stepladder are locked into place.  Ask someone to hold it for you, if possible.  Clearly mark and make sure that you can see:  Any grab bars or handrails.  First and last steps.  Where the edge of each step is.  Use tools that help you move around (mobility aids) if they are needed. These include:  Canes.  Walkers.  Scooters.  Crutches.  Turn on the lights when you go into a dark area. Replace any light bulbs as soon as they burn out.  Set up your furniture so you have a clear path. Avoid moving your furniture around.  If any of your floors are uneven, fix them.  If there are any pets around you, be aware of where they are.  Review your medicines with your doctor. Some medicines can make you feel dizzy. This can increase your chance of falling. Ask your doctor what other things that you can do to help prevent falls. This information is not intended to replace advice given to you by your health care provider. Make sure you discuss any questions you have with your health care provider. Document Released: 04/28/2009 Document Revised: 12/08/2015 Document Reviewed: 08/06/2014 Elsevier Interactive Patient Education  2017 [employer].

## 2020-05-05 NOTE — Telephone Encounter (Signed)
Tried calling the pt and there was no answer- LMTCB.  

## 2020-05-06 ENCOUNTER — Ambulatory Visit (INDEPENDENT_AMBULATORY_CARE_PROVIDER_SITE_OTHER): Payer: Medicare Other | Admitting: Psychology

## 2020-05-06 ENCOUNTER — Telehealth: Payer: Self-pay | Admitting: Pulmonary Disease

## 2020-05-06 ENCOUNTER — Ambulatory Visit: Payer: Medicare Other

## 2020-05-06 DIAGNOSIS — F4322 Adjustment disorder with anxiety: Secondary | ICD-10-CM

## 2020-05-06 DIAGNOSIS — J9611 Chronic respiratory failure with hypoxia: Secondary | ICD-10-CM

## 2020-05-06 DIAGNOSIS — I5043 Acute on chronic combined systolic (congestive) and diastolic (congestive) heart failure: Secondary | ICD-10-CM

## 2020-05-06 NOTE — Telephone Encounter (Signed)
Lm x2 for patient.  Letter has been mailed to address on file.

## 2020-05-06 NOTE — Telephone Encounter (Signed)
Left message for patient to call back  

## 2020-05-09 DIAGNOSIS — I5032 Chronic diastolic (congestive) heart failure: Secondary | ICD-10-CM | POA: Diagnosis not present

## 2020-05-09 DIAGNOSIS — D631 Anemia in chronic kidney disease: Secondary | ICD-10-CM | POA: Diagnosis not present

## 2020-05-09 DIAGNOSIS — D500 Iron deficiency anemia secondary to blood loss (chronic): Secondary | ICD-10-CM | POA: Diagnosis not present

## 2020-05-09 DIAGNOSIS — N183 Chronic kidney disease, stage 3 unspecified: Secondary | ICD-10-CM | POA: Diagnosis not present

## 2020-05-09 DIAGNOSIS — I4819 Other persistent atrial fibrillation: Secondary | ICD-10-CM | POA: Diagnosis not present

## 2020-05-09 DIAGNOSIS — I130 Hypertensive heart and chronic kidney disease with heart failure and stage 1 through stage 4 chronic kidney disease, or unspecified chronic kidney disease: Secondary | ICD-10-CM | POA: Diagnosis not present

## 2020-05-09 NOTE — Telephone Encounter (Signed)
Pt retuning a phone call. Pt can be reached at [phone]. Please leave a detailed message if no answer and pt will call back.

## 2020-05-09 NOTE — Telephone Encounter (Signed)
I spoke with the pt and notified of response per Dr Elsworth Soho  She verbalized understanding  Order sent to [facility] to d/c o2 and appt with [facility] scheduled for f/u

## 2020-05-09 NOTE — Telephone Encounter (Signed)
Okay with me to send order to discontinue to Last seen in our office 11/2019, oxygen not provided by [name]. Please arrange for routine follow-up office visit with APP to assess

## 2020-05-09 NOTE — Telephone Encounter (Signed)
Spoke with the pt  She is asking for [name] to send order to [facility] to pick up all o2  She states she only used it once and checks her sats regularly and never gets a reading below 90% ra  Please advise, thanks!

## 2020-05-10 DIAGNOSIS — I4819 Other persistent atrial fibrillation: Secondary | ICD-10-CM | POA: Diagnosis not present

## 2020-05-10 DIAGNOSIS — D500 Iron deficiency anemia secondary to blood loss (chronic): Secondary | ICD-10-CM | POA: Diagnosis not present

## 2020-05-10 DIAGNOSIS — I5032 Chronic diastolic (congestive) heart failure: Secondary | ICD-10-CM | POA: Diagnosis not present

## 2020-05-10 DIAGNOSIS — D631 Anemia in chronic kidney disease: Secondary | ICD-10-CM | POA: Diagnosis not present

## 2020-05-10 DIAGNOSIS — I130 Hypertensive heart and chronic kidney disease with heart failure and stage 1 through stage 4 chronic kidney disease, or unspecified chronic kidney disease: Secondary | ICD-10-CM | POA: Diagnosis not present

## 2020-05-10 DIAGNOSIS — N183 Chronic kidney disease, stage 3 unspecified: Secondary | ICD-10-CM | POA: Diagnosis not present

## 2020-05-11 ENCOUNTER — Telehealth: Payer: Self-pay

## 2020-05-11 NOTE — Progress Notes (Signed)
Chronic Care Management Pharmacy Assistant   Name: [name]  MRN: [id number] DOB: Apr 24, 1946  Reason for Encounter:COPD  Disease State Call.  PCP : [name], NP  Allergies:   Allergies  Allergen Reactions  . Ace Inhibitors Other (See Comments)    Reaction unknown  . 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 Amlodipine   . Warfarin Other (See Comments)    Left side brain hemorrhage  . Warfarin Sodium Other (See Comments)    Caused her to have a stroke    Medications: Outpatient Encounter Medications as of 05/11/2020  Medication Sig Note  . acetaminophen (TYLENOL) 500 MG tablet Take 500 mg by mouth every 6 (six) hours as needed for mild pain or headache.    . albuterol (PROVENTIL) (2.5 MG/3ML) 0.083% nebulizer solution Take 3 mLs (2.5 mg total) by nebulization every 6 (six) hours as needed for wheezing or shortness of breath. Dx:J45.909 03/17/2020: Has this medication but is not currently using nebullizer.   . budesonide-formoterol (SYMBICORT) 160-4.5 MCG/ACT inhaler Inhale 2 puffs into the lungs 2 (two) times daily.   . calcitRIOL (ROCALTROL) 0.25 MCG capsule Take 0.25 mcg by mouth every other day.   . carvedilol (COREG) 12.5 MG tablet Take 2 tablets (25 mg total) by mouth 2 (two) times daily. 03/12/2020: Unable to verify time  . docusate sodium (COLACE) 100 MG capsule Take 100 mg by mouth daily.    . DULoxetine (CYMBALTA) 30 MG capsule TAKE 1 CAPSULE BY MOUTH EVERY DAY (Patient taking differently: Take 30 mg by mouth daily. )   . Ferrous Sulfate (IRON PO) Take 325 mg by mouth daily.    . furosemide (LASIX) 80 MG tablet Take 1 tablet (80 mg total) by mouth 2 (two) times daily. (Patient taking differently: Take 160 mg by mouth 2 (two) times daily. ) 03/17/2020: Reports taking 160 mg twice a day and states this is confirmed by instructions on bottle.   . mycophenolate (MYFORTIC) 180 MG EC tablet Take 180 mg  by mouth 2 (two) times daily.   . ondansetron (ZOFRAN) 4 MG tablet Take 1 tablet (4 mg total) by mouth every 6 (six) hours as needed for nausea.   . potassium chloride SA (K-DUR,KLOR-CON) 20 MEQ tablet Take 20 mEq by mouth daily.    . predniSONE (DELTASONE) 5 MG tablet Take 1 tablet (5 mg total) by mouth daily. 03/17/2020: Will start after dose pack  . tacrolimus (PROGRAF) 1 MG capsule Take 2 mg by mouth 2 (two) times daily.    . VENTOLIN HFA 108 (90 Base) MCG/ACT inhaler Inhale 2 puffs into the lungs every 4 (four) hours as needed for wheezing or shortness of breath.     No facility-administered encounter medications on file as of 05/11/2020.    Current Diagnosis: Patient Active Problem List   Diagnosis Date Noted  . SOB (shortness of breath) 03/11/2020  . Pneumonia due to COVID-19 virus 03/11/2020  . Wound of right leg 01/28/2020  . Traumatic seroma of lower leg, right, subsequent encounter 01/28/2020  . Other secondary pulmonary hypertension ([place]) 12/03/2019  . Acute GI bleeding 11/14/2019  . GI bleed 11/14/2019  . Medication management 11/04/2019  . Acute CHF (congestive heart failure) ([place]) 10/23/2019  . Acute on chronic combined systolic and diastolic congestive heart failure ([place]) 10/22/2019  . S/P AV nodal ablation 10/08/2018  . Cardiac pacemaker in situ 04/18/2018  . Oral  mucositis 02/22/2018  . 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 Personal history of PE (pulmonary embolism) 02/22/2018  . Acute on chronic systolic heart failure ([place]) 02/11/2018  . Hyperkalemia 02/03/2018  . Atrial fibrillation with RVR ([place]) 02/03/2018  . Lower extremity edema 12/10/2017  . Acute kidney injury superimposed on chronic kidney disease ([place]) 11/17/2017  . Acute diastolic (congestive) heart failure ([place]) 11/17/2017  . UTI (urinary tract infection) 11/03/2017  . Diarrhea 11/03/2017  . Presence of Watchman left atrial appendage closure device 10/04/2017  . Chronic respiratory failure with  hypoxia ([place]) 10/03/2017  . Persistent atrial fibrillation ([place]) 08/12/2017  . Dyspnea 07/26/2017  . Atrial flutter ([place])   . Peripheral vascular disease ([place])   . Oligouria   . Hypertension   . Fractures, stress   . Eczema   . Distal radius fracture, right   . Depression   . Constipation   . Arthritis   . Exacerbation of asthma 04/01/2017  . Closed fracture of right distal radius 10/18/2016  . Proteinuria 09/04/2016  . 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 Immunosuppression ([place]) 07/25/2015  . Nonischemic dilated cardiomyopathy ([place])   . S/P insertion of IVC (inferior vena caval) filter 04/07/2013  . Use of cane as ambulatory aid 04/07/2013  . H/O: stroke 04/07/2013  . Expected blood loss anemia 03/26/2012  . S/P hip replacement 03/25/2012  . DVT of lower extremity, bilateral ([place]) 12/21/2011  . Personal history of DVT (deep vein thrombosis) 12/08/2011  . Obstructive sleep apnea 12/08/2011  . End stage renal disease ([place]) 11/06/2011  . 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 Pseudomonas aeruginosa infection 10/24/2011  . ESRD (end stage renal disease) on dialysis ([place]) 09/14/2011  . Hypercalcemia   . Gout 09/10/2011  . Absence of right hip joint with antibiotic pacer 09/03/2011  . Traumatic seroma of thigh ([place]) 06/11/2011  . Clostridium difficile colitis 06/09/2011  . Leukocytosis 06/08/2011  . Anemia 06/06/2011  . ARF (acute renal failure) ([place]) 06/06/2011  . SINUSITIS- ACUTE-NOS 09/27/2010  . PAF (paroxysmal atrial fibrillation) ([place]) 11/25/2008  . RENAL FAILURE, ACUTE 11/25/2008  . CVA 09/28/2008  . CVA (cerebrovascular accident due to intracerebral hemorrhage) ([place]) 09/28/2008  . WEAKNESS, LEFT SIDE OF BODY 09/21/2008  . Morbid obesity ([place]) 10/20/2007  . Psoriasis 10/20/2007  . Stroke due to intracerebral hemorrhage ([place]) 07/17/2007  . Stroke ([place]) 07/17/2007  . Essential hypertension 07/07/2007  . Asthmatic bronchitis without complication 76/28/3151     Follow-Up:  Pharmacist Review  . Current COPD regimen: (patient states she does not have COPD) o Albuterol 0.083% nebulizer o Symbicort 160-4.5 MCG/ACT inhaler o Ventolin HFA 108 MCG/ACT inhaler . No flowsheet data found. . Any recent hospitalizations or ED visits since last visit with CPP? No . Reports COPD symptoms, including Increased shortness of breath  . What recent interventions/DTPs have been made by any provider to improve breathing since last visit:None ID . Have you had exacerbation/flare-up since last visit? Yes . What do you do when you are short of breath?  Rescue medication and Anxiety medication  Respiratory Devices/Equipment . Do you have a nebulizer? Yes . Do you use a Peak Flow Meter? No . Do you use a maintenance inhaler? Yes . How often do you forget to use your daily inhaler? No . Do you use a rescue inhaler? Yes . How often do you use your rescue inhaler?  prn  o Use her inhaler more in the winter then summer  . Do you use a  spacer with your inhaler? No  Adherence Review: . Does the patient have >5 day gap between last estimated fill date for maintenance inhaler medications? Yes   [place] Pharmacist Assistant [phone]

## 2020-05-17 ENCOUNTER — Other Ambulatory Visit: Payer: Self-pay

## 2020-05-17 ENCOUNTER — Encounter: Payer: Self-pay | Admitting: Primary Care

## 2020-05-17 ENCOUNTER — Ambulatory Visit (INDEPENDENT_AMBULATORY_CARE_PROVIDER_SITE_OTHER): Payer: Medicare Other | Admitting: Primary Care

## 2020-05-17 DIAGNOSIS — S8012XA Contusion of left lower leg, initial encounter: Secondary | ICD-10-CM | POA: Insufficient documentation

## 2020-05-17 DIAGNOSIS — J4540 Moderate persistent asthma, uncomplicated: Secondary | ICD-10-CM

## 2020-05-17 DIAGNOSIS — I5022 Chronic systolic (congestive) heart failure: Secondary | ICD-10-CM

## 2020-05-17 DIAGNOSIS — I2729 Other secondary pulmonary hypertension: Secondary | ICD-10-CM | POA: Diagnosis not present

## 2020-05-17 HISTORY — DX: Contusion of left lower leg, initial encounter: S80.12XA

## 2020-05-17 NOTE — Assessment & Plan Note (Addendum)
-   Stable interval, maintained on Symbicort 160 two puffs BID  - Plan taper off ICS/LABA maintenance inhaler if symptoms permit. - Recommend decreasing Symbicort 160 to 1 puff twice daily. If doing well at next visit we can further de-esclate to 66mcg 1 puff twice daily or trial off all together

## 2020-05-17 NOTE — Assessment & Plan Note (Signed)
-   Localized hematoma to medial left lower leg. No signs of infection or area of open wound - Recommend using compression stockings during the day and elevate legs as much as possible - Patient to contact PCP if hematoma does not improve in 1 week or worsens

## 2020-05-17 NOTE — Progress Notes (Signed)
@Patient  ID: [name], female    DOB: [birth year], 74 y.o.   MRN: [id number]  Chief Complaint  Patient presents with  . Follow-up    Referring provider: [name], [name], NP  HPI: 74 year old female, never smoked. PMH significant for chronic cough, asthma, amiodarone induced ILD, ESRD s/p kidney transplant in 2017 (myfortic and tacrolimus), chronic proximal A. fib/flutter status post ablation in 2017 and watchman in March 2019, congestive heart failure, history of DVT/PE status post IVC filter.  Patient of Dr. Elsworth Soho. Doubt true asthma, maintained on Symbicort 80 two puffs twice daily.  Previous [facility] pulmonary encounters: 12/29/2018 Patient presents for 6 month follow-up. She has mild ILD and chronic left lower lobe crackles r/t amiodarone toxicity. Breathing has been ok. Reports asthma flare a few weeks ago. Continues using Symbicort twice daily, feels its helps and can tell a difference when she does not use it. Previous dry cough but the past two weeks it hasn't been bad. Still trying to learn to walk better, had physical therapy in the past. Needs to exercise more.  She recognizes she is better when she is more active. She does not have much appetite and has to force herself to eat. Hurt her back 2 weeks ago moving her mattress. She has been told not to lift more than 10 lbs. Trying to do stuff herself.  05/17/2020- Interim hx Patient presents today for 63-month follow-up for asthma/ secondary pulmonary hypertension. In the past, patient's respiratory symptoms have appeared to be more related to pulmonary hypertension/diastolic heart failure than true asthma. She does not seem to have significant ILD.  During last visit she was referred to heart failure clinic and asked to weigh herself 3 times a week and continue Lasix 80 mg twice daily. It was felt to be okay to continue her on steroid/LABA combination inhaler at that time. She is maintained on Symbicort. She saw Pharmacy in May 2021, cost  was the same for Breo vs Symbicort. Patient requested to resume Symbicort and was restarted on Symb 160 two puffs twice daily.  Breathing wise she is doing fairly well. She is current taking Symbicort 160 two puffs twice daily. Respiratory symptoms are stable. She will get out of breath when she over exerts her self. She was diagnosed with COVID-19 in August. She is getting better, states that her strength is coming back. She is getting physical and occupational therapy once a week at home. Ambulates with cane. Patient's oxygen was discontinued by Dr. Elsworth Soho on 05/06/2020. Since last office visit she has established with the heart failure clinic and has a follow-up in February 2022.  She recently lost her 25 year old grandson. She is sleeping well. Appetite is fair, getting a little better. She never lost smell and taste. She is compliant with lasix 80mg  twice daily. Denies cough, wheezing, chest tightness.   Significant tests/ events reviewed Spirometry3/2019significant restriction with FVC 25%, FEV1 28%. Ratio 87. Decreased mid flows.  PFTs 11/2017-moderate restriction, FVC 53%, TLC 55%, DLCO 43%, no obstruction  HRCT5/2019-mild patchy subpleural reticulation and groundglass bilateral lungs without significant traction bronchiectasis or frank honeycombing. Findings nonspecific may represent mild postinfectious/inflammatory changes. Possibility of ILD such as NSIP or early UIP cannot be excluded but is not favored  HRCT 01/12/20- Limited by respiratory motion, no definitive evidence of fibrotic interstitial lung disease.  Allergies  Allergen Reactions  . Ace Inhibitors Other (See Comments)    Reaction unknown  . Amiodarone Other (See Comments)    Pt  with Restrictive Lung Disease by 10/2017 PFT with decreased DLCO  . Amlodipine   . Warfarin Other (See Comments)    Left side brain hemorrhage  . Warfarin Sodium Other (See Comments)    Caused her to have a stroke    Immunization History    Administered Date(s) Administered  . Fluad Quad(high Dose 65+) 04/28/2019, 05/02/2020  . Influenza Split 03/27/2012  . Influenza Whole 04/29/2008  . Influenza, High Dose Seasonal PF 04/19/2017, 03/16/2018  . Influenza,inj,Quad PF,6+ Mos 04/24/2013  . Influenza-Unspecified 04/29/2014, 03/17/2015, 04/18/2016  . PFIZER SARS-COV-2 Vaccination 09/09/2019, 09/23/2019  . Pneumococcal Conjugate-13 03/02/2014  . Pneumococcal Polysaccharide-23 04/25/2005, 09/18/2016  . Tdap 05/08/2013    Past Medical History:  Diagnosis Date  . Acute diastolic (congestive) heart failure ([place]) 08/12/2017  . AKI (acute kidney injury) ([place]) 11/04/2017  . Anemia   . Anxiety   . Arthritis    "shoulders; arms; hips" (02/04/2018)  . Asthma   . Atrial fibrillation ([place])   . Atrial flutter [facility])    s/p aflutter ablation at [facility]  . Benign hypertension with ESRD (end-stage renal disease) ([place])   . Blood transfusion    never had a reaction to blood transfusion  . Cardiomyopathy Mar 2013   Mild, EF 50-55% by Mar 2013 ECHO, diast dysfxn II  . Cardiomyopathy ([place])   . CHF (congestive heart failure) ([place]) 2005  . CHF (congestive heart failure) ([place])   . Childhood asthma   . Closed fracture of right distal radius 10/18/2016  . CMV (cytomegalovirus infection) ([place]) 10/03/2015  . Constipation    takes Miralax daily  . Constipation   . Depression    takes Zoloft daily  . Distal radius fracture, right   . DVT of lower extremity, bilateral ([place]) 12/21/11   "they're there now; been there for 2 wks"  . Eczema   . ESRD (end stage renal disease) on dialysis [facility]) 09/2011   07-19-2015 had Kidney transplant at [facility]; "don't get dialysis anymore" (02/04/2018)  . Fractures, stress    in both feet--6 OR 7 YRS AGO--RESOLVED  . Generalized edema [id number]  . Gout    doesn't require meds   . HCAP (healthcare-associated pneumonia) 09/10/2011  . Hearing difficulty   . High cholesterol   . History of CVA (cerebrovascular accident)  without residual deficits   . History of hip replacement, total, right   . History of pneumonia   . Hx of Clostridium difficile infection   . Hypercalcemia    09/10/11  . Hyperparathyroidism ([place]) 04/07/2013  . Hypertension    takes Diltiazem daily   . Hypomagnesemia 08/02/2015  . Hypophosphatemia 11/08/2017  . ICB (intracranial bleed) ([place])   . Immunosuppression ([place]) 07/25/2015  . Memory changes   . Morbid obesity ([place])   . Nonischemic dilated cardiomyopathy ([place])   . Obesity 04/07/2013  . Oligouria   . OSA on CPAP   . PAF (paroxysmal atrial fibrillation) ([facility])     HX OF CEREBRAL BLEED WHILE ON COUMADIN-SO PT NOT ON ANY BLOOD THINNERS NOW  . Peripheral vascular disease ([place])   . Presence of Watchman left atrial appendage closure device 10/04/2017  . Proteinuria 09/04/2016  . Psoriasis 08/07/2016  . 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 S/P insertion of IVC (inferior vena caval) filter   . Sepsis ([place]) 11/08/2017  . Stress fracture    bilateral feet  . Stroke [facility]) 2009   denies residual on 02/04/2018;  hemorrhagic now off coumadin  . Stroke due to intracerebral  hemorrhage ([place]) 2009  . Suture reaction 09/04/2016  . Transfusion history   . Use of cane as ambulatory aid     Tobacco History: Social History   Tobacco Use  Smoking Status Never Smoker  Smokeless Tobacco Never Used   Counseling given: Not Answered   Outpatient Medications Prior to Visit  Medication Sig Dispense Refill  . acetaminophen (TYLENOL) 500 MG tablet Take 500 mg by mouth every 6 (six) hours as needed for mild pain or headache.     . albuterol (PROVENTIL) (2.5 MG/3ML) 0.083% nebulizer solution Take 3 mLs (2.5 mg total) by nebulization every 6 (six) hours as needed for wheezing or shortness of breath. Dx:J45.909 150 mL 2  . budesonide-formoterol (SYMBICORT) 160-4.5 MCG/ACT inhaler Inhale 2 puffs into the lungs 2 (two) times daily. 1 Inhaler 5  . calcitRIOL (ROCALTROL) 0.25 MCG capsule Take 0.25  mcg by mouth every other day.    . carvedilol (COREG) 12.5 MG tablet Take 2 tablets (25 mg total) by mouth 2 (two) times daily.    Marland Kitchen docusate sodium (COLACE) 100 MG capsule Take 100 mg by mouth daily.     . DULoxetine (CYMBALTA) 30 MG capsule TAKE 1 CAPSULE BY MOUTH EVERY DAY (Patient taking differently: Take 30 mg by mouth daily. ) 90 capsule 2  . Ferrous Sulfate (IRON PO) Take 325 mg by mouth daily.     . furosemide (LASIX) 80 MG tablet Take 1 tablet (80 mg total) by mouth 2 (two) times daily. (Patient taking differently: Take 160 mg by mouth 2 (two) times daily. ) 60 tablet 3  . mycophenolate (MYFORTIC) 180 MG EC tablet Take 180 mg by mouth 2 (two) times daily.    . potassium chloride SA (K-DUR,KLOR-CON) 20 MEQ tablet Take 20 mEq by mouth daily.     . tacrolimus (PROGRAF) 1 MG capsule Take 2 mg by mouth 2 (two) times daily.     . VENTOLIN HFA 108 (90 Base) MCG/ACT inhaler Inhale 2 puffs into the lungs every 4 (four) hours as needed for wheezing or shortness of breath.     . ondansetron (ZOFRAN) 4 MG tablet Take 1 tablet (4 mg total) by mouth every 6 (six) hours as needed for nausea. (Patient not taking: Reported on 05/17/2020) 20 tablet 0  . predniSONE (DELTASONE) 5 MG tablet Take 1 tablet (5 mg total) by mouth daily. (Patient not taking: Reported on 05/17/2020) 90 tablet 3   No facility-administered medications prior to visit.    Review of Systems  Review of Systems  Constitutional: Negative.   Respiratory: Negative for cough, chest tightness, shortness of breath and wheezing.        DOE  Cardiovascular: Positive for leg swelling.  Psychiatric/Behavioral: Negative.     Physical Exam  BP 120/80 (BP Location: Right Arm, Patient Position: Sitting, Cuff Size: Normal)   Pulse (!) 103   Temp (!) 97.2 F (36.2 C) (Temporal)   Ht 5\' 6"  (1.676 m)   Wt 141 lb (64 kg)   SpO2 98%   BMI 22.76 kg/m  Physical Exam Constitutional:      Appearance: Normal appearance.  Cardiovascular:      Rate and Rhythm: Normal rate and regular rhythm.     Comments: +Trace-1 BLE edema Pulmonary:     Effort: Pulmonary effort is normal.     Breath sounds: Normal breath sounds. No wheezing, rhonchi or rales.  Skin:    Comments: Large left LE hematoma  Neurological:     Mental Status:  She is alert.      Lab Results:  CBC    Component Value Date/Time   WBC 4.1 03/13/2020 1118   RBC 5.27 (H) 03/13/2020 1118   HGB 13.1 03/13/2020 1118   HGB 10.8 (L) 11/27/2019 1616   HCT 42.1 03/13/2020 1118   HCT 33.8 (L) 11/27/2019 1616   PLT 269 03/13/2020 1118   PLT 284 11/27/2019 1616   MCV 79.9 (L) 03/13/2020 1118   MCV 88 11/27/2019 1616   MCH 24.9 (L) 03/13/2020 1118   MCHC 31.1 03/13/2020 1118   RDW 16.0 (H) 03/13/2020 1118   RDW 15.1 11/27/2019 1616   LYMPHSABS 0.6 (L) 11/27/2019 1616   MONOABS 0.7 11/14/2019 1553   EOSABS 0.0 11/27/2019 1616   BASOSABS 0.1 11/27/2019 1616    BMET    Component Value Date/Time   NA 138 03/13/2020 1118   NA 140 12/16/2019 0000   K 4.6 03/13/2020 1118   CL 99 03/13/2020 1118   CO2 28 03/13/2020 1118   GLUCOSE 141 (H) 03/13/2020 1118   BUN 77 (H) 03/13/2020 1118   BUN 23 (A) 12/16/2019 0000   CREATININE 2.31 (H) 03/13/2020 1118   CALCIUM 8.8 (L) 03/13/2020 1118   CALCIUM 11.1 (H) 09/11/2011 0825   GFRNONAA 21 (L) 03/13/2020 1118   GFRAA 24 (L) 03/13/2020 1118    BNP    Component Value Date/Time   BNP 943.2 (H) 03/11/2020 1029    ProBNP No results found for: PROBNP  Imaging: No results found.   Assessment & Plan:   Asthmatic bronchitis without complication - Stable interval, maintained on Symbicort 160 two puffs BID  - Plan taper off ICS/LABA maintenance inhaler if symptoms permit. - Recommend decreasing Symbicort 160 to 1 puff twice daily. 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 No signs of infection or area of open wound - Recommend using compression stockings during the day and elevate legs as much as possible - Patient to contact PCP if hematoma does not improve in 1 week or worsens     Martyn Ehrich, NP 05/17/2020

## 2020-05-17 NOTE — Assessment & Plan Note (Signed)
-   WHO group 2 - Continue diuretics, no oxygen requirements

## 2020-05-17 NOTE — Patient Instructions (Addendum)
Asthma:  - Decrease Symbicort 185mcg to 1 puff twice daily, if breathing worsens go back up to 2 puffs twice daily   Lower extremity swelling/left leg hematoma:  - Continue lasix 80mg  twice daily  - Recommend using compression stockings during the day every day and elevate legs as much as possible - Review patient instructions below for hematoma  - If hematoma is not better in 1 week please call PCP  Follow-up: - 6 months with Dr. Elsworth Soho   Hematoma A hematoma is a collection of blood. A hematoma can happen:  Under the skin.  In an organ.  In a body space.  In a joint space.  In other tissues. The blood can thicken (clot) to form a lump that you can see and feel. The lump is often hard and may become sore and tender. The lump can be very small or very big. Most hematomas get better in a few days to weeks. However, some hematomas may be serious and need medical care. What are the causes? This condition is caused by:  An injury.  Blood that leaks under the skin.  Problems from surgeries.  Medical conditions that cause bleeding or bruising. What increases the risk? You are more likely to develop this condition if:  You are an older adult.  You use medicines that thin your blood. What are the signs or symptoms? Symptoms depend on where the hematoma is in your body.  If the hematoma is under the skin, there is: ? A firm lump on the body. ? Pain and tenderness in the area. ? Bruising. The skin above the lump may be blue, dark blue, purple-red, or yellowish.  If the hematoma is deep in the tissues or body spaces, there may be: ? Blood in the stomach. This may cause pain in the belly (abdomen), weakness, passing out (fainting), and shortness of breath. ? Blood in the head. This may cause a headache, weakness, trouble speaking or understanding speech, or passing out. How is this diagnosed? This condition is diagnosed based on:  Your medical history.  A physical  exam.  Imaging tests, such as ultrasound or CT scan.  Blood tests. How is this treated? Treatment depends on the cause, size, and location of the hematoma. Treatment may include:  Doing nothing. Many hematomas go away on their own without treatment.  Surgery or close monitoring. This may be needed for large hematomas or hematomas that affect the body's organs.  Medicines. These may be given if a medical condition caused the hematoma. Follow these instructions at home: Managing pain, stiffness, and swelling   If told, put ice on the area. ? Put ice in a plastic bag. ? Place a towel between your skin and the bag. ? Leave the ice on for 20 minutes, 2-3 times a day for the first two days.  If told, put heat on the affected area after putting ice on the area for two days. Use the heat source that your doctor tells you to use. This could be a moist heat pack or a heating pad. To do this: ? Place a towel between your skin and the heat source. ? Leave the heat on for 20-30 minutes. ? Remove the heat if your skin turns bright red. This is very important if you are unable to feel pain, heat, or cold. You may have a greater risk of getting burned.  Raise (elevate) the affected area above the level of your heart while you are sitting or lying  down.  Wrap the affected area with an elastic bandage, if told by your doctor. Do not wrap the bandage too tightly.  If your hematoma is on a leg or foot and is painful, your doctor may give you crutches. Use them as told by your doctor. General instructions  Take over-the-counter and prescription medicines only as told by your doctor.  Keep all follow-up visits as told by your doctor. This is important. Contact a doctor if:  You have a fever.  The swelling or bruising gets worse.  You start to get more hematomas. Get help right away if:  Your pain gets worse.  Your pain is not getting better with medicine.  Your skin over the hematoma  breaks or starts to bleed.  Your hematoma is in your chest or belly and you: ? Pass out. ? Feel weak. ? Become short of breath.  You have a hematoma on your scalp that is caused by a fall or injury, and you: ? Have a headache that gets worse. ? Have trouble speaking or understanding speech. ? Become less alert or you pass out. Summary  A hematoma is a collection of blood in any part of your body.  Most hematomas get better on their own in a few days to weeks. Some may need medical care.  Follow instructions from your doctor about how to care for your hematoma.  Contact a doctor if the swelling or bruising gets worse, or if you are short of breath. This information is not intended to replace advice given to you by your health care provider. Make sure you discuss any questions you have with your health care provider. Document Revised: 12/05/2017 Document Reviewed: 12/05/2017 Elsevier Patient Education  2020 [employer].

## 2020-05-17 NOTE — Assessment & Plan Note (Addendum)
-   Established with HF clinic; continue daily weights and lasix 80mg  twice daily

## 2020-05-18 DIAGNOSIS — D500 Iron deficiency anemia secondary to blood loss (chronic): Secondary | ICD-10-CM | POA: Diagnosis not present

## 2020-05-18 DIAGNOSIS — I4819 Other persistent atrial fibrillation: Secondary | ICD-10-CM | POA: Diagnosis not present

## 2020-05-18 DIAGNOSIS — I5032 Chronic diastolic (congestive) heart failure: Secondary | ICD-10-CM | POA: Diagnosis not present

## 2020-05-18 DIAGNOSIS — N183 Chronic kidney disease, stage 3 unspecified: Secondary | ICD-10-CM | POA: Diagnosis not present

## 2020-05-18 DIAGNOSIS — D631 Anemia in chronic kidney disease: Secondary | ICD-10-CM | POA: Diagnosis not present

## 2020-05-18 DIAGNOSIS — I130 Hypertensive heart and chronic kidney disease with heart failure and stage 1 through stage 4 chronic kidney disease, or unspecified chronic kidney disease: Secondary | ICD-10-CM | POA: Diagnosis not present

## 2020-05-20 DIAGNOSIS — Z940 Kidney transplant status: Secondary | ICD-10-CM | POA: Diagnosis not present

## 2020-05-23 DIAGNOSIS — I130 Hypertensive heart and chronic kidney disease with heart failure and stage 1 through stage 4 chronic kidney disease, or unspecified chronic kidney disease: Secondary | ICD-10-CM | POA: Diagnosis not present

## 2020-05-23 DIAGNOSIS — I5032 Chronic diastolic (congestive) heart failure: Secondary | ICD-10-CM | POA: Diagnosis not present

## 2020-05-23 DIAGNOSIS — I4819 Other persistent atrial fibrillation: Secondary | ICD-10-CM | POA: Diagnosis not present

## 2020-05-23 DIAGNOSIS — N183 Chronic kidney disease, stage 3 unspecified: Secondary | ICD-10-CM | POA: Diagnosis not present

## 2020-05-23 DIAGNOSIS — D631 Anemia in chronic kidney disease: Secondary | ICD-10-CM | POA: Diagnosis not present

## 2020-05-23 DIAGNOSIS — D500 Iron deficiency anemia secondary to blood loss (chronic): Secondary | ICD-10-CM | POA: Diagnosis not present

## 2020-05-24 ENCOUNTER — Ambulatory Visit (INDEPENDENT_AMBULATORY_CARE_PROVIDER_SITE_OTHER): Payer: Medicare Other | Admitting: Psychology

## 2020-05-24 DIAGNOSIS — F4322 Adjustment disorder with anxiety: Secondary | ICD-10-CM | POA: Diagnosis not present

## 2020-05-24 DIAGNOSIS — I4819 Other persistent atrial fibrillation: Secondary | ICD-10-CM | POA: Diagnosis not present

## 2020-05-24 DIAGNOSIS — D500 Iron deficiency anemia secondary to blood loss (chronic): Secondary | ICD-10-CM | POA: Diagnosis not present

## 2020-05-24 DIAGNOSIS — I5032 Chronic diastolic (congestive) heart failure: Secondary | ICD-10-CM | POA: Diagnosis not present

## 2020-05-24 DIAGNOSIS — I130 Hypertensive heart and chronic kidney disease with heart failure and stage 1 through stage 4 chronic kidney disease, or unspecified chronic kidney disease: Secondary | ICD-10-CM | POA: Diagnosis not present

## 2020-05-24 DIAGNOSIS — N183 Chronic kidney disease, stage 3 unspecified: Secondary | ICD-10-CM | POA: Diagnosis not present

## 2020-05-24 DIAGNOSIS — D631 Anemia in chronic kidney disease: Secondary | ICD-10-CM | POA: Diagnosis not present

## 2020-05-25 DIAGNOSIS — I739 Peripheral vascular disease, unspecified: Secondary | ICD-10-CM | POA: Diagnosis not present

## 2020-05-25 DIAGNOSIS — Z7952 Long term (current) use of systemic steroids: Secondary | ICD-10-CM | POA: Diagnosis not present

## 2020-05-25 DIAGNOSIS — D500 Iron deficiency anemia secondary to blood loss (chronic): Secondary | ICD-10-CM | POA: Diagnosis not present

## 2020-05-25 DIAGNOSIS — N183 Chronic kidney disease, stage 3 unspecified: Secondary | ICD-10-CM | POA: Diagnosis not present

## 2020-05-25 DIAGNOSIS — N2581 Secondary hyperparathyroidism of renal origin: Secondary | ICD-10-CM | POA: Diagnosis not present

## 2020-05-25 DIAGNOSIS — Z7951 Long term (current) use of inhaled steroids: Secondary | ICD-10-CM | POA: Diagnosis not present

## 2020-05-25 DIAGNOSIS — Z9181 History of falling: Secondary | ICD-10-CM | POA: Diagnosis not present

## 2020-05-25 DIAGNOSIS — I5032 Chronic diastolic (congestive) heart failure: Secondary | ICD-10-CM | POA: Diagnosis not present

## 2020-05-25 DIAGNOSIS — F419 Anxiety disorder, unspecified: Secondary | ICD-10-CM | POA: Diagnosis not present

## 2020-05-25 DIAGNOSIS — Z86711 Personal history of pulmonary embolism: Secondary | ICD-10-CM | POA: Diagnosis not present

## 2020-05-25 DIAGNOSIS — Z8616 Personal history of COVID-19: Secondary | ICD-10-CM | POA: Diagnosis not present

## 2020-05-25 DIAGNOSIS — I4819 Other persistent atrial fibrillation: Secondary | ICD-10-CM | POA: Diagnosis not present

## 2020-05-25 DIAGNOSIS — Z8701 Personal history of pneumonia (recurrent): Secondary | ICD-10-CM | POA: Diagnosis not present

## 2020-05-25 DIAGNOSIS — I081 Rheumatic disorders of both mitral and tricuspid valves: Secondary | ICD-10-CM | POA: Diagnosis not present

## 2020-05-25 DIAGNOSIS — D631 Anemia in chronic kidney disease: Secondary | ICD-10-CM | POA: Diagnosis not present

## 2020-05-25 DIAGNOSIS — M1030 Gout due to renal impairment, unspecified site: Secondary | ICD-10-CM | POA: Diagnosis not present

## 2020-05-25 DIAGNOSIS — Z8673 Personal history of transient ischemic attack (TIA), and cerebral infarction without residual deficits: Secondary | ICD-10-CM | POA: Diagnosis not present

## 2020-05-25 DIAGNOSIS — L409 Psoriasis, unspecified: Secondary | ICD-10-CM | POA: Diagnosis not present

## 2020-05-25 DIAGNOSIS — I130 Hypertensive heart and chronic kidney disease with heart failure and stage 1 through stage 4 chronic kidney disease, or unspecified chronic kidney disease: Secondary | ICD-10-CM | POA: Diagnosis not present

## 2020-05-25 DIAGNOSIS — F32A Depression, unspecified: Secondary | ICD-10-CM | POA: Diagnosis not present

## 2020-05-25 DIAGNOSIS — J45909 Unspecified asthma, uncomplicated: Secondary | ICD-10-CM | POA: Diagnosis not present

## 2020-05-25 DIAGNOSIS — G4733 Obstructive sleep apnea (adult) (pediatric): Secondary | ICD-10-CM | POA: Diagnosis not present

## 2020-05-25 DIAGNOSIS — I429 Cardiomyopathy, unspecified: Secondary | ICD-10-CM | POA: Diagnosis not present

## 2020-05-30 DIAGNOSIS — I4819 Other persistent atrial fibrillation: Secondary | ICD-10-CM | POA: Diagnosis not present

## 2020-05-30 DIAGNOSIS — D500 Iron deficiency anemia secondary to blood loss (chronic): Secondary | ICD-10-CM | POA: Diagnosis not present

## 2020-05-30 DIAGNOSIS — I130 Hypertensive heart and chronic kidney disease with heart failure and stage 1 through stage 4 chronic kidney disease, or unspecified chronic kidney disease: Secondary | ICD-10-CM | POA: Diagnosis not present

## 2020-05-30 DIAGNOSIS — N183 Chronic kidney disease, stage 3 unspecified: Secondary | ICD-10-CM | POA: Diagnosis not present

## 2020-05-30 DIAGNOSIS — I5032 Chronic diastolic (congestive) heart failure: Secondary | ICD-10-CM | POA: Diagnosis not present

## 2020-05-30 DIAGNOSIS — D631 Anemia in chronic kidney disease: Secondary | ICD-10-CM | POA: Diagnosis not present

## 2020-05-31 DIAGNOSIS — I5032 Chronic diastolic (congestive) heart failure: Secondary | ICD-10-CM | POA: Diagnosis not present

## 2020-05-31 DIAGNOSIS — I4819 Other persistent atrial fibrillation: Secondary | ICD-10-CM | POA: Diagnosis not present

## 2020-05-31 DIAGNOSIS — D631 Anemia in chronic kidney disease: Secondary | ICD-10-CM | POA: Diagnosis not present

## 2020-05-31 DIAGNOSIS — D500 Iron deficiency anemia secondary to blood loss (chronic): Secondary | ICD-10-CM | POA: Diagnosis not present

## 2020-05-31 DIAGNOSIS — N183 Chronic kidney disease, stage 3 unspecified: Secondary | ICD-10-CM | POA: Diagnosis not present

## 2020-05-31 DIAGNOSIS — I130 Hypertensive heart and chronic kidney disease with heart failure and stage 1 through stage 4 chronic kidney disease, or unspecified chronic kidney disease: Secondary | ICD-10-CM | POA: Diagnosis not present

## 2020-06-07 ENCOUNTER — Ambulatory Visit: Payer: Medicare Other | Admitting: Psychology

## 2020-06-07 ENCOUNTER — Ambulatory Visit (INDEPENDENT_AMBULATORY_CARE_PROVIDER_SITE_OTHER): Payer: Medicare Other | Admitting: Psychology

## 2020-06-07 DIAGNOSIS — N183 Chronic kidney disease, stage 3 unspecified: Secondary | ICD-10-CM | POA: Diagnosis not present

## 2020-06-07 DIAGNOSIS — I4819 Other persistent atrial fibrillation: Secondary | ICD-10-CM | POA: Diagnosis not present

## 2020-06-07 DIAGNOSIS — D631 Anemia in chronic kidney disease: Secondary | ICD-10-CM | POA: Diagnosis not present

## 2020-06-07 DIAGNOSIS — D500 Iron deficiency anemia secondary to blood loss (chronic): Secondary | ICD-10-CM | POA: Diagnosis not present

## 2020-06-07 DIAGNOSIS — I5032 Chronic diastolic (congestive) heart failure: Secondary | ICD-10-CM | POA: Diagnosis not present

## 2020-06-07 DIAGNOSIS — F4322 Adjustment disorder with anxiety: Secondary | ICD-10-CM

## 2020-06-07 DIAGNOSIS — I130 Hypertensive heart and chronic kidney disease with heart failure and stage 1 through stage 4 chronic kidney disease, or unspecified chronic kidney disease: Secondary | ICD-10-CM | POA: Diagnosis not present

## 2020-06-13 DIAGNOSIS — I4819 Other persistent atrial fibrillation: Secondary | ICD-10-CM | POA: Diagnosis not present

## 2020-06-13 DIAGNOSIS — N183 Chronic kidney disease, stage 3 unspecified: Secondary | ICD-10-CM | POA: Diagnosis not present

## 2020-06-13 DIAGNOSIS — D631 Anemia in chronic kidney disease: Secondary | ICD-10-CM | POA: Diagnosis not present

## 2020-06-13 DIAGNOSIS — I130 Hypertensive heart and chronic kidney disease with heart failure and stage 1 through stage 4 chronic kidney disease, or unspecified chronic kidney disease: Secondary | ICD-10-CM | POA: Diagnosis not present

## 2020-06-13 DIAGNOSIS — I5032 Chronic diastolic (congestive) heart failure: Secondary | ICD-10-CM | POA: Diagnosis not present

## 2020-06-13 DIAGNOSIS — D500 Iron deficiency anemia secondary to blood loss (chronic): Secondary | ICD-10-CM | POA: Diagnosis not present

## 2020-06-20 DIAGNOSIS — I5032 Chronic diastolic (congestive) heart failure: Secondary | ICD-10-CM | POA: Diagnosis not present

## 2020-06-20 DIAGNOSIS — D631 Anemia in chronic kidney disease: Secondary | ICD-10-CM | POA: Diagnosis not present

## 2020-06-20 DIAGNOSIS — D500 Iron deficiency anemia secondary to blood loss (chronic): Secondary | ICD-10-CM | POA: Diagnosis not present

## 2020-06-20 DIAGNOSIS — I4819 Other persistent atrial fibrillation: Secondary | ICD-10-CM | POA: Diagnosis not present

## 2020-06-20 DIAGNOSIS — I130 Hypertensive heart and chronic kidney disease with heart failure and stage 1 through stage 4 chronic kidney disease, or unspecified chronic kidney disease: Secondary | ICD-10-CM | POA: Diagnosis not present

## 2020-06-20 DIAGNOSIS — N183 Chronic kidney disease, stage 3 unspecified: Secondary | ICD-10-CM | POA: Diagnosis not present

## 2020-06-21 ENCOUNTER — Telehealth: Payer: Self-pay

## 2020-06-21 NOTE — Telephone Encounter (Signed)
Claiborne Billings from [facility] calling to get verbal orders to extend patient's home Physical Therapy to once a week for 8 weeks.  Please advise.[phone]

## 2020-06-22 NOTE — Telephone Encounter (Signed)
LVM for return call, name on voicemail does not match name left in message.

## 2020-06-24 ENCOUNTER — Ambulatory Visit: Payer: Medicare Other | Attending: Critical Care Medicine

## 2020-06-24 DIAGNOSIS — Z23 Encounter for immunization: Secondary | ICD-10-CM

## 2020-06-24 DIAGNOSIS — Z7952 Long term (current) use of systemic steroids: Secondary | ICD-10-CM | POA: Diagnosis not present

## 2020-06-24 DIAGNOSIS — M1030 Gout due to renal impairment, unspecified site: Secondary | ICD-10-CM | POA: Diagnosis not present

## 2020-06-24 DIAGNOSIS — N2581 Secondary hyperparathyroidism of renal origin: Secondary | ICD-10-CM | POA: Diagnosis not present

## 2020-06-24 DIAGNOSIS — I5032 Chronic diastolic (congestive) heart failure: Secondary | ICD-10-CM | POA: Diagnosis not present

## 2020-06-24 DIAGNOSIS — N183 Chronic kidney disease, stage 3 unspecified: Secondary | ICD-10-CM | POA: Diagnosis not present

## 2020-06-24 DIAGNOSIS — Z7951 Long term (current) use of inhaled steroids: Secondary | ICD-10-CM | POA: Diagnosis not present

## 2020-06-24 DIAGNOSIS — I739 Peripheral vascular disease, unspecified: Secondary | ICD-10-CM | POA: Diagnosis not present

## 2020-06-24 DIAGNOSIS — Z86711 Personal history of pulmonary embolism: Secondary | ICD-10-CM | POA: Diagnosis not present

## 2020-06-24 DIAGNOSIS — Z8701 Personal history of pneumonia (recurrent): Secondary | ICD-10-CM | POA: Diagnosis not present

## 2020-06-24 DIAGNOSIS — J45909 Unspecified asthma, uncomplicated: Secondary | ICD-10-CM | POA: Diagnosis not present

## 2020-06-24 DIAGNOSIS — Z9181 History of falling: Secondary | ICD-10-CM | POA: Diagnosis not present

## 2020-06-24 DIAGNOSIS — F419 Anxiety disorder, unspecified: Secondary | ICD-10-CM | POA: Diagnosis not present

## 2020-06-24 DIAGNOSIS — Z8616 Personal history of COVID-19: Secondary | ICD-10-CM | POA: Diagnosis not present

## 2020-06-24 DIAGNOSIS — I081 Rheumatic disorders of both mitral and tricuspid valves: Secondary | ICD-10-CM | POA: Diagnosis not present

## 2020-06-24 DIAGNOSIS — F32A Depression, unspecified: Secondary | ICD-10-CM | POA: Diagnosis not present

## 2020-06-24 DIAGNOSIS — D631 Anemia in chronic kidney disease: Secondary | ICD-10-CM | POA: Diagnosis not present

## 2020-06-24 DIAGNOSIS — D500 Iron deficiency anemia secondary to blood loss (chronic): Secondary | ICD-10-CM | POA: Diagnosis not present

## 2020-06-24 DIAGNOSIS — L409 Psoriasis, unspecified: Secondary | ICD-10-CM | POA: Diagnosis not present

## 2020-06-24 DIAGNOSIS — Z8673 Personal history of transient ischemic attack (TIA), and cerebral infarction without residual deficits: Secondary | ICD-10-CM | POA: Diagnosis not present

## 2020-06-24 DIAGNOSIS — G4733 Obstructive sleep apnea (adult) (pediatric): Secondary | ICD-10-CM | POA: Diagnosis not present

## 2020-06-24 DIAGNOSIS — I130 Hypertensive heart and chronic kidney disease with heart failure and stage 1 through stage 4 chronic kidney disease, or unspecified chronic kidney disease: Secondary | ICD-10-CM | POA: Diagnosis not present

## 2020-06-24 DIAGNOSIS — I4819 Other persistent atrial fibrillation: Secondary | ICD-10-CM | POA: Diagnosis not present

## 2020-06-24 DIAGNOSIS — I429 Cardiomyopathy, unspecified: Secondary | ICD-10-CM | POA: Diagnosis not present

## 2020-06-24 NOTE — Progress Notes (Signed)
   Covid-19 Vaccination Clinic  Name:  [name]    MRN: [id number] DOB: November 07, 1945  06/24/2020  [name] was observed post Covid-19 immunization for 15 minutes without incident. She was provided with Vaccine Information Sheet and instruction to access the V-Safe system.   [name] was instructed to call 911 with any severe reactions post vaccine: Marland Kitchen Difficulty breathing  . Swelling of face and throat  . A fast heartbeat  . A bad rash all over body  . Dizziness and weakness   Immunizations Administered    Name Date Dose VIS Date Route   Pfizer COVID-19 Vaccine 06/24/2020  1:07 PM 0.3 mL 05/04/2020 Intramuscular   Manufacturer: [place]   Lot: [id number]   [place]: [address]-3

## 2020-06-28 DIAGNOSIS — I130 Hypertensive heart and chronic kidney disease with heart failure and stage 1 through stage 4 chronic kidney disease, or unspecified chronic kidney disease: Secondary | ICD-10-CM | POA: Diagnosis not present

## 2020-06-28 DIAGNOSIS — D631 Anemia in chronic kidney disease: Secondary | ICD-10-CM | POA: Diagnosis not present

## 2020-06-28 DIAGNOSIS — N2581 Secondary hyperparathyroidism of renal origin: Secondary | ICD-10-CM | POA: Diagnosis not present

## 2020-06-28 DIAGNOSIS — I5032 Chronic diastolic (congestive) heart failure: Secondary | ICD-10-CM | POA: Diagnosis not present

## 2020-06-28 DIAGNOSIS — N183 Chronic kidney disease, stage 3 unspecified: Secondary | ICD-10-CM | POA: Diagnosis not present

## 2020-06-28 DIAGNOSIS — M1030 Gout due to renal impairment, unspecified site: Secondary | ICD-10-CM | POA: Diagnosis not present

## 2020-06-29 ENCOUNTER — Ambulatory Visit (INDEPENDENT_AMBULATORY_CARE_PROVIDER_SITE_OTHER): Payer: Medicare Other | Admitting: Nurse Practitioner

## 2020-06-29 ENCOUNTER — Encounter: Payer: Self-pay | Admitting: Nurse Practitioner

## 2020-06-29 ENCOUNTER — Other Ambulatory Visit: Payer: Self-pay

## 2020-06-29 VITALS — BP 120/74 | HR 78 | Temp 97.6°F | Ht 65.0 in | Wt 138.8 lb

## 2020-06-29 DIAGNOSIS — Z136 Encounter for screening for cardiovascular disorders: Secondary | ICD-10-CM | POA: Diagnosis not present

## 2020-06-29 DIAGNOSIS — Z13220 Encounter for screening for lipoid disorders: Secondary | ICD-10-CM | POA: Diagnosis not present

## 2020-06-29 DIAGNOSIS — K219 Gastro-esophageal reflux disease without esophagitis: Secondary | ICD-10-CM | POA: Insufficient documentation

## 2020-06-29 DIAGNOSIS — F32A Depression, unspecified: Secondary | ICD-10-CM

## 2020-06-29 DIAGNOSIS — N185 Chronic kidney disease, stage 5: Secondary | ICD-10-CM | POA: Diagnosis not present

## 2020-06-29 DIAGNOSIS — I120 Hypertensive chronic kidney disease with stage 5 chronic kidney disease or end stage renal disease: Secondary | ICD-10-CM

## 2020-06-29 DIAGNOSIS — I69952 Hemiplegia and hemiparesis following unspecified cerebrovascular disease affecting left dominant side: Secondary | ICD-10-CM

## 2020-06-29 DIAGNOSIS — D509 Iron deficiency anemia, unspecified: Secondary | ICD-10-CM | POA: Insufficient documentation

## 2020-06-29 DIAGNOSIS — R739 Hyperglycemia, unspecified: Secondary | ICD-10-CM | POA: Diagnosis not present

## 2020-06-29 DIAGNOSIS — I10 Essential (primary) hypertension: Secondary | ICD-10-CM | POA: Diagnosis not present

## 2020-06-29 DIAGNOSIS — I825Y3 Chronic embolism and thrombosis of unspecified deep veins of proximal lower extremity, bilateral: Secondary | ICD-10-CM

## 2020-06-29 DIAGNOSIS — K5730 Diverticulosis of large intestine without perforation or abscess without bleeding: Secondary | ICD-10-CM | POA: Insufficient documentation

## 2020-06-29 NOTE — Patient Instructions (Signed)
Have lab drawn by nephrologist and have results faxed to me.

## 2020-06-29 NOTE — Assessment & Plan Note (Signed)
S/p kidney transplant. Under the care of [facility]. Upcoming appt 07/24/2020 per patient

## 2020-06-29 NOTE — Assessment & Plan Note (Signed)
Stable mood with cymbalta. Ongoing counseling. Recent loss of grandson-24yrs old (accidental), also started weekly grief counseling which has been helpful per patient.

## 2020-06-29 NOTE — Assessment & Plan Note (Signed)
Stable BP with coreg BP Readings from Last 3 Encounters:  06/29/20 120/74  05/17/20 120/80  04/12/20 130/76    maintain current medication

## 2020-06-29 NOTE — Assessment & Plan Note (Signed)
Improved with home PT. Current use of cane

## 2020-06-29 NOTE — Progress Notes (Signed)
Subjective:  Patient ID: [name], female    DOB: 1945-08-22  Age: 74 y.o. MRN: [id number]  CC: Follow-up (6 month f/u on HTN and depression. 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 [name]. Upcoming appt 07/24/2020 per patient   Hemiplegia ([name]) Improved with home PT. Current use of cane  Depression Stable mood with cymbalta. Ongoing counseling. Recent loss of grandson-39yrs old (accidental), also started weekly grief counseling which has been helpful per patient.  Wt Readings from Last 3 Encounters:  06/29/20 138 lb 12.8 oz (63 kg)  05/17/20 141 lb (64 kg)  05/05/20 134 lb (60.8 kg)   Reviewed past Medical, Social and Family history today.  Outpatient Medications Prior to Visit  Medication Sig Dispense Refill  . acetaminophen (TYLENOL) 500 MG tablet Take 500 mg by mouth every 6 (six) hours as needed for mild pain or headache.     . albuterol (PROVENTIL) (2.5 MG/3ML) 0.083% nebulizer solution Take 3 mLs (2.5 mg total) by nebulization every 6 (six) hours as needed for wheezing or shortness of breath. Dx:J45.909 150 mL 2  . budesonide-formoterol (SYMBICORT) 160-4.5 MCG/ACT inhaler Inhale 2 puffs into the lungs 2 (two) times daily. 1 Inhaler 5  . calcitRIOL (ROCALTROL) 0.25 MCG capsule Take 0.25 mcg by mouth every other day.    . carvedilol (COREG) 12.5 MG tablet Take 2 tablets (25 mg total) by mouth 2 (two) times daily.    Marland Kitchen docusate sodium (COLACE) 100 MG capsule Take 100 mg by mouth daily.    . DULoxetine (CYMBALTA) 30 MG capsule TAKE 1 CAPSULE BY MOUTH EVERY DAY (Patient taking differently: Take 30 mg by mouth daily.) 90 capsule 2  . Ferrous Sulfate (IRON PO) Take 325 mg by mouth daily.     . furosemide  (LASIX) 80 MG tablet Take 1 tablet (80 mg total) by mouth 2 (two) times daily. (Patient taking differently: Take 160 mg by mouth 2 (two) times daily.) 60 tablet 3  . mycophenolate (MYFORTIC) 180 MG EC tablet Take 180 mg by mouth 2 (two) times daily.    . ondansetron (ZOFRAN) 4 MG tablet Take 1 tablet (4 mg total) by mouth every 6 (six) hours as needed for nausea. 20 tablet 0  . potassium chloride SA (K-DUR,KLOR-CON) 20 MEQ tablet Take 20 mEq by mouth daily.     . tacrolimus (PROGRAF) 1 MG capsule Take 2 mg by mouth 2 (two) times daily.     . VENTOLIN HFA 108 (90 Base) MCG/ACT inhaler Inhale 2 puffs into the lungs every 4 (four) hours as needed for wheezing or shortness of breath.      No facility-administered medications prior to visit.    ROS See HPI  Objective:  BP 120/74 (BP Location: Right Arm, Patient Position: Sitting, Cuff Size: Normal)   Pulse 78   Temp 97.6 F (36.4 C) (Temporal)   Ht 5\' 5"  (1.651 m)   Wt 138 lb 12.8 oz (63 kg)   SpO2 98%   BMI 23.10 kg/m   Physical Exam Vitals reviewed.  Cardiovascular:     Rate and Rhythm: Normal rate and regular rhythm.     Pulses: Normal pulses.     Heart sounds: Normal heart sounds.  Pulmonary:  Effort: Pulmonary effort is normal.     Breath sounds: Normal breath sounds.  Musculoskeletal:     Right lower leg: No edema.     Left lower leg: No edema.  Neurological:     Mental Status: She is alert and oriented to person, place, and time.  Psychiatric:        Mood and Affect: Mood normal.        Behavior: Behavior normal.        Thought Content: Thought content normal.    Assessment & Plan:  This visit occurred during the SARS-CoV-2 public health emergency.  Safety protocols were in place, including screening questions prior to the visit, additional usage of staff PPE, and extensive cleaning of exam room while observing appropriate contact time as indicated for disinfecting solutions.   [name] was seen today for  follow-up.  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 [name]. Upcoming appt 07/24/2020 per patient         Other   Depression    Stable mood with cymbalta. Ongoing counseling. Recent loss of grandson-91yrs old (accidental), also started weekly grief counseling which has been helpful per patient.       Other Visit Diagnoses    Encounter for lipid screening for cardiovascular disease       Relevant Orders   Lipid panel   Hyperglycemia       Relevant Orders   Hemoglobin A1c      Follow-up: Return in about 3 months (around 09/27/2020) for HTN and weight loss (36mins).  Wilfred Lacy, NP

## 2020-07-04 ENCOUNTER — Ambulatory Visit (INDEPENDENT_AMBULATORY_CARE_PROVIDER_SITE_OTHER): Payer: Medicare Other | Admitting: Psychology

## 2020-07-04 ENCOUNTER — Ambulatory Visit: Payer: Medicare Other | Admitting: Nurse Practitioner

## 2020-07-04 DIAGNOSIS — M1030 Gout due to renal impairment, unspecified site: Secondary | ICD-10-CM | POA: Diagnosis not present

## 2020-07-04 DIAGNOSIS — F4322 Adjustment disorder with anxiety: Secondary | ICD-10-CM | POA: Diagnosis not present

## 2020-07-04 DIAGNOSIS — D631 Anemia in chronic kidney disease: Secondary | ICD-10-CM | POA: Diagnosis not present

## 2020-07-04 DIAGNOSIS — I130 Hypertensive heart and chronic kidney disease with heart failure and stage 1 through stage 4 chronic kidney disease, or unspecified chronic kidney disease: Secondary | ICD-10-CM | POA: Diagnosis not present

## 2020-07-04 DIAGNOSIS — N2581 Secondary hyperparathyroidism of renal origin: Secondary | ICD-10-CM | POA: Diagnosis not present

## 2020-07-04 DIAGNOSIS — I5032 Chronic diastolic (congestive) heart failure: Secondary | ICD-10-CM | POA: Diagnosis not present

## 2020-07-04 DIAGNOSIS — N183 Chronic kidney disease, stage 3 unspecified: Secondary | ICD-10-CM | POA: Diagnosis not present

## 2020-07-06 DIAGNOSIS — Z95818 Presence of other cardiac implants and grafts: Secondary | ICD-10-CM | POA: Diagnosis not present

## 2020-07-06 DIAGNOSIS — Z7901 Long term (current) use of anticoagulants: Secondary | ICD-10-CM | POA: Diagnosis not present

## 2020-07-06 DIAGNOSIS — I4892 Unspecified atrial flutter: Secondary | ICD-10-CM | POA: Diagnosis not present

## 2020-07-06 DIAGNOSIS — Z950 Presence of cardiac pacemaker: Secondary | ICD-10-CM | POA: Diagnosis not present

## 2020-07-06 DIAGNOSIS — Z79899 Other long term (current) drug therapy: Secondary | ICD-10-CM | POA: Diagnosis not present

## 2020-07-06 DIAGNOSIS — I5032 Chronic diastolic (congestive) heart failure: Secondary | ICD-10-CM | POA: Diagnosis not present

## 2020-07-06 DIAGNOSIS — Z9889 Other specified postprocedural states: Secondary | ICD-10-CM | POA: Diagnosis not present

## 2020-07-06 DIAGNOSIS — I509 Heart failure, unspecified: Secondary | ICD-10-CM | POA: Diagnosis not present

## 2020-07-06 DIAGNOSIS — I4819 Other persistent atrial fibrillation: Secondary | ICD-10-CM | POA: Diagnosis not present

## 2020-07-09 DIAGNOSIS — I499 Cardiac arrhythmia, unspecified: Secondary | ICD-10-CM | POA: Diagnosis not present

## 2020-07-09 DIAGNOSIS — I4819 Other persistent atrial fibrillation: Secondary | ICD-10-CM | POA: Diagnosis not present

## 2020-07-09 DIAGNOSIS — Z950 Presence of cardiac pacemaker: Secondary | ICD-10-CM | POA: Diagnosis not present

## 2020-07-11 ENCOUNTER — Other Ambulatory Visit: Payer: Self-pay | Admitting: Nurse Practitioner

## 2020-07-11 DIAGNOSIS — F322 Major depressive disorder, single episode, severe without psychotic features: Secondary | ICD-10-CM

## 2020-07-11 DIAGNOSIS — I130 Hypertensive heart and chronic kidney disease with heart failure and stage 1 through stage 4 chronic kidney disease, or unspecified chronic kidney disease: Secondary | ICD-10-CM | POA: Diagnosis not present

## 2020-07-11 DIAGNOSIS — I5032 Chronic diastolic (congestive) heart failure: Secondary | ICD-10-CM | POA: Diagnosis not present

## 2020-07-11 DIAGNOSIS — M1030 Gout due to renal impairment, unspecified site: Secondary | ICD-10-CM | POA: Diagnosis not present

## 2020-07-11 DIAGNOSIS — N2581 Secondary hyperparathyroidism of renal origin: Secondary | ICD-10-CM | POA: Diagnosis not present

## 2020-07-11 DIAGNOSIS — D631 Anemia in chronic kidney disease: Secondary | ICD-10-CM | POA: Diagnosis not present

## 2020-07-11 DIAGNOSIS — N183 Chronic kidney disease, stage 3 unspecified: Secondary | ICD-10-CM | POA: Diagnosis not present

## 2020-07-11 NOTE — Telephone Encounter (Signed)
Please help advise in absence of Baldo Ash this week.   Last office visit for this was 06/29/2020.

## 2020-07-14 ENCOUNTER — Telehealth: Payer: Medicare Other | Admitting: Nurse Practitioner

## 2020-07-18 ENCOUNTER — Telehealth: Payer: Self-pay

## 2020-07-18 NOTE — Progress Notes (Signed)
Chronic Care Management Pharmacy Assistant   Name: [name]  MRN: [id number] DOB: Oct 22, 1945  Reason for Encounter: COPD Disease State Call.  PCP : [name], NP  Allergies:   Allergies  Allergen Reactions  . Ace Inhibitors Other (See Comments)    Reaction unknown  . 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 Amlodipine   . Warfarin Other (See Comments)    Left side brain hemorrhage  . Warfarin Sodium Other (See Comments)    Caused her to have a stroke    Medications: Outpatient Encounter Medications as of 07/18/2020  Medication Sig Note  . acetaminophen (TYLENOL) 500 MG tablet Take 500 mg by mouth every 6 (six) hours as needed for mild pain or headache.    . albuterol (PROVENTIL) (2.5 MG/3ML) 0.083% nebulizer solution Take 3 mLs (2.5 mg total) by nebulization every 6 (six) hours as needed for wheezing or shortness of breath. Dx:J45.909 03/17/2020: Has this medication but is not currently using nebullizer.   . budesonide-formoterol (SYMBICORT) 160-4.5 MCG/ACT inhaler Inhale 2 puffs into the lungs 2 (two) times daily.   . calcitRIOL (ROCALTROL) 0.25 MCG capsule Take 0.25 mcg by mouth every other day.   . carvedilol (COREG) 12.5 MG tablet Take 2 tablets (25 mg total) by mouth 2 (two) times daily. 03/12/2020: Unable to verify time  . docusate sodium (COLACE) 100 MG capsule Take 100 mg by mouth daily.   . DULoxetine (CYMBALTA) 30 MG capsule TAKE 1 CAPSULE BY MOUTH EVERY DAY   . Ferrous Sulfate (IRON PO) Take 325 mg by mouth daily.    . furosemide (LASIX) 80 MG tablet Take 1 tablet (80 mg total) by mouth 2 (two) times daily. (Patient taking differently: Take 160 mg by mouth 2 (two) times daily.) 03/17/2020: Reports taking 160 mg twice a day and states this is confirmed by instructions on bottle.   . mycophenolate (MYFORTIC) 180 MG EC tablet Take 180 mg by mouth 2 (two) times daily.   . ondansetron (ZOFRAN) 4 MG  tablet Take 1 tablet (4 mg total) by mouth every 6 (six) hours as needed for nausea.   . potassium chloride SA (K-DUR,KLOR-CON) 20 MEQ tablet Take 20 mEq by mouth daily.    . tacrolimus (PROGRAF) 1 MG capsule Take 2 mg by mouth 2 (two) times daily.    . VENTOLIN HFA 108 (90 Base) MCG/ACT inhaler Inhale 2 puffs into the lungs every 4 (four) hours as needed for wheezing or shortness of breath.     No facility-administered encounter medications on file as of 07/18/2020.    Current Diagnosis: Patient Active Problem List   Diagnosis Date Noted  . 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 Gastroesophageal reflux disease 06/29/2020  . Iron deficiency anemia 06/29/2020  . Hematoma of left lower leg 05/17/2020  . SOB (shortness of breath) 03/11/2020  . Pneumonia due to COVID-19 virus 03/11/2020  . Wound of right leg 01/28/2020  . Traumatic seroma of lower leg, right, subsequent encounter 01/28/2020  . Other secondary pulmonary hypertension ([place]) 12/03/2019  . Acute GI bleeding 11/14/2019  . GI bleed 11/14/2019  . Medication management 11/04/2019  . Acute CHF (congestive heart failure) ([place]) 10/23/2019  . Acute on chronic combined systolic and diastolic congestive heart failure ([place]) 10/22/2019  . S/P AV nodal ablation 10/08/2018  . Cardiac pacemaker in situ 04/18/2018  . Oral mucositis 02/22/2018  . 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 Personal history of PE (pulmonary embolism) 02/22/2018  . Chronic systolic heart failure ([place]) 02/11/2018  . Hyperkalemia 02/03/2018  . Atrial fibrillation with RVR ([place]) 02/03/2018  . Lower extremity edema 12/10/2017  . Acute diastolic (congestive) heart failure ([place]) 11/17/2017  . UTI (urinary tract infection) 11/03/2017  . Diarrhea 11/03/2017  . Presence of Watchman left atrial appendage closure device 10/04/2017  . Chronic respiratory failure with hypoxia ([place]) 10/03/2017  . Persistent atrial fibrillation ([place]) 08/12/2017  . Dyspnea  07/26/2017  . Atrial flutter ([place])   . Peripheral vascular disease ([place])   . Oligouria   . Hypertension   . Fractures, stress   . Eczema   . Distal radius fracture, right   . Depression   . Constipation   . Arthritis   . Exacerbation of asthma 04/01/2017  . Closed fracture of right distal radius 10/18/2016  . Proteinuria 09/04/2016  . 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 Immunosuppression ([place]) 07/25/2015  . Nonischemic dilated cardiomyopathy ([place])   . S/P insertion of IVC (inferior vena caval) filter 04/07/2013  . Use of cane as ambulatory aid 04/07/2013  . H/O: stroke 04/07/2013  . Expected blood loss anemia 03/26/2012  . S/P hip replacement 03/25/2012  . DVT of lower extremity, bilateral ([place]) 12/21/2011  . Personal history of DVT (deep vein thrombosis) 12/08/2011  . Obstructive sleep apnea 12/08/2011  . 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 Pseudomonas aeruginosa infection 10/24/2011  . 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 Hypercalcemia   . Gout 09/10/2011  . Absence of right hip joint with antibiotic pacer 09/03/2011  . Traumatic seroma of thigh ([place]) 06/11/2011  . Clostridium difficile colitis 06/09/2011  . Leukocytosis 06/08/2011  . Anemia 06/06/2011  . SINUSITIS- ACUTE-NOS 09/27/2010  . PAF (paroxysmal atrial fibrillation) ([place]) 11/25/2008  . RENAL FAILURE, ACUTE 11/25/2008  . CVA 09/28/2008  . CVA (cerebrovascular accident due to intracerebral hemorrhage) ([place]) 09/28/2008  . Hemiplegia ([place]) 09/21/2008  . Psoriasis 10/20/2007  . Stroke due to intracerebral hemorrhage ([place]) 07/17/2007  . Stroke ([place]) 07/17/2007  . Asthmatic bronchitis without complication 57/84/6962    Goals Addressed   None    . Current COPD regimen:  ? Albuterol 0.083% nebulizer ? Symbicort 160-4.5 MCG/ACT inhaler ? Ventolin HFA 108 MCG/ACT inhaler  . No flowsheet data found. . Any recent hospitalizations or ED visits  since last visit with CPP? No .  . What recent interventions/DTPs have been made by any provider to improve breathing since last visit:None ID    Adherence Review: . Does the patient have >5 day gap between last estimated fill date for maintenance inhaler medications? Yes   LVM 01/03,01/12 ,01/14  I have attempted without success to contact this patient by phone three times to do his COPD Disease State call. I left a Voice message for patient to return my call.   Follow-Up:  Pharmacist Review   Bessie Posen Pharmacist Assistant [phone]

## 2020-07-21 DIAGNOSIS — N2581 Secondary hyperparathyroidism of renal origin: Secondary | ICD-10-CM | POA: Diagnosis not present

## 2020-07-21 DIAGNOSIS — I130 Hypertensive heart and chronic kidney disease with heart failure and stage 1 through stage 4 chronic kidney disease, or unspecified chronic kidney disease: Secondary | ICD-10-CM | POA: Diagnosis not present

## 2020-07-21 DIAGNOSIS — D631 Anemia in chronic kidney disease: Secondary | ICD-10-CM | POA: Diagnosis not present

## 2020-07-21 DIAGNOSIS — I5032 Chronic diastolic (congestive) heart failure: Secondary | ICD-10-CM | POA: Diagnosis not present

## 2020-07-21 DIAGNOSIS — N183 Chronic kidney disease, stage 3 unspecified: Secondary | ICD-10-CM | POA: Diagnosis not present

## 2020-07-21 DIAGNOSIS — M1030 Gout due to renal impairment, unspecified site: Secondary | ICD-10-CM | POA: Diagnosis not present

## 2020-07-22 DIAGNOSIS — N2581 Secondary hyperparathyroidism of renal origin: Secondary | ICD-10-CM | POA: Diagnosis not present

## 2020-07-22 DIAGNOSIS — N184 Chronic kidney disease, stage 4 (severe): Secondary | ICD-10-CM | POA: Diagnosis not present

## 2020-07-22 DIAGNOSIS — D849 Immunodeficiency, unspecified: Secondary | ICD-10-CM | POA: Diagnosis not present

## 2020-07-22 DIAGNOSIS — R809 Proteinuria, unspecified: Secondary | ICD-10-CM | POA: Diagnosis not present

## 2020-07-22 DIAGNOSIS — Z940 Kidney transplant status: Secondary | ICD-10-CM | POA: Diagnosis not present

## 2020-07-22 DIAGNOSIS — I129 Hypertensive chronic kidney disease with stage 1 through stage 4 chronic kidney disease, or unspecified chronic kidney disease: Secondary | ICD-10-CM | POA: Diagnosis not present

## 2020-07-23 DIAGNOSIS — N390 Urinary tract infection, site not specified: Secondary | ICD-10-CM | POA: Diagnosis not present

## 2020-07-24 DIAGNOSIS — L409 Psoriasis, unspecified: Secondary | ICD-10-CM | POA: Diagnosis not present

## 2020-07-24 DIAGNOSIS — Z86711 Personal history of pulmonary embolism: Secondary | ICD-10-CM | POA: Diagnosis not present

## 2020-07-24 DIAGNOSIS — J45909 Unspecified asthma, uncomplicated: Secondary | ICD-10-CM | POA: Diagnosis not present

## 2020-07-24 DIAGNOSIS — I130 Hypertensive heart and chronic kidney disease with heart failure and stage 1 through stage 4 chronic kidney disease, or unspecified chronic kidney disease: Secondary | ICD-10-CM | POA: Diagnosis not present

## 2020-07-24 DIAGNOSIS — M1030 Gout due to renal impairment, unspecified site: Secondary | ICD-10-CM | POA: Diagnosis not present

## 2020-07-24 DIAGNOSIS — I739 Peripheral vascular disease, unspecified: Secondary | ICD-10-CM | POA: Diagnosis not present

## 2020-07-24 DIAGNOSIS — Z7952 Long term (current) use of systemic steroids: Secondary | ICD-10-CM | POA: Diagnosis not present

## 2020-07-24 DIAGNOSIS — I081 Rheumatic disorders of both mitral and tricuspid valves: Secondary | ICD-10-CM | POA: Diagnosis not present

## 2020-07-24 DIAGNOSIS — Z8616 Personal history of COVID-19: Secondary | ICD-10-CM | POA: Diagnosis not present

## 2020-07-24 DIAGNOSIS — Z8673 Personal history of transient ischemic attack (TIA), and cerebral infarction without residual deficits: Secondary | ICD-10-CM | POA: Diagnosis not present

## 2020-07-24 DIAGNOSIS — G4733 Obstructive sleep apnea (adult) (pediatric): Secondary | ICD-10-CM | POA: Diagnosis not present

## 2020-07-24 DIAGNOSIS — N183 Chronic kidney disease, stage 3 unspecified: Secondary | ICD-10-CM | POA: Diagnosis not present

## 2020-07-24 DIAGNOSIS — I4819 Other persistent atrial fibrillation: Secondary | ICD-10-CM | POA: Diagnosis not present

## 2020-07-24 DIAGNOSIS — I5032 Chronic diastolic (congestive) heart failure: Secondary | ICD-10-CM | POA: Diagnosis not present

## 2020-07-24 DIAGNOSIS — N2581 Secondary hyperparathyroidism of renal origin: Secondary | ICD-10-CM | POA: Diagnosis not present

## 2020-07-24 DIAGNOSIS — D631 Anemia in chronic kidney disease: Secondary | ICD-10-CM | POA: Diagnosis not present

## 2020-07-24 DIAGNOSIS — Z9181 History of falling: Secondary | ICD-10-CM | POA: Diagnosis not present

## 2020-07-24 DIAGNOSIS — F419 Anxiety disorder, unspecified: Secondary | ICD-10-CM | POA: Diagnosis not present

## 2020-07-24 DIAGNOSIS — Z8701 Personal history of pneumonia (recurrent): Secondary | ICD-10-CM | POA: Diagnosis not present

## 2020-07-24 DIAGNOSIS — D500 Iron deficiency anemia secondary to blood loss (chronic): Secondary | ICD-10-CM | POA: Diagnosis not present

## 2020-07-24 DIAGNOSIS — I429 Cardiomyopathy, unspecified: Secondary | ICD-10-CM | POA: Diagnosis not present

## 2020-07-24 DIAGNOSIS — F32A Depression, unspecified: Secondary | ICD-10-CM | POA: Diagnosis not present

## 2020-07-24 DIAGNOSIS — Z7951 Long term (current) use of inhaled steroids: Secondary | ICD-10-CM | POA: Diagnosis not present

## 2020-07-25 ENCOUNTER — Ambulatory Visit (INDEPENDENT_AMBULATORY_CARE_PROVIDER_SITE_OTHER): Payer: Medicare Other | Admitting: Psychology

## 2020-07-25 DIAGNOSIS — N183 Chronic kidney disease, stage 3 unspecified: Secondary | ICD-10-CM | POA: Diagnosis not present

## 2020-07-25 DIAGNOSIS — D631 Anemia in chronic kidney disease: Secondary | ICD-10-CM | POA: Diagnosis not present

## 2020-07-25 DIAGNOSIS — F4322 Adjustment disorder with anxiety: Secondary | ICD-10-CM

## 2020-07-25 DIAGNOSIS — I130 Hypertensive heart and chronic kidney disease with heart failure and stage 1 through stage 4 chronic kidney disease, or unspecified chronic kidney disease: Secondary | ICD-10-CM | POA: Diagnosis not present

## 2020-07-25 DIAGNOSIS — I5032 Chronic diastolic (congestive) heart failure: Secondary | ICD-10-CM | POA: Diagnosis not present

## 2020-07-25 DIAGNOSIS — M1030 Gout due to renal impairment, unspecified site: Secondary | ICD-10-CM | POA: Diagnosis not present

## 2020-07-25 DIAGNOSIS — N2581 Secondary hyperparathyroidism of renal origin: Secondary | ICD-10-CM | POA: Diagnosis not present

## 2020-07-29 ENCOUNTER — Other Ambulatory Visit: Payer: Medicare Other

## 2020-07-29 DIAGNOSIS — Z20822 Contact with and (suspected) exposure to covid-19: Secondary | ICD-10-CM | POA: Diagnosis not present

## 2020-07-29 DIAGNOSIS — Z940 Kidney transplant status: Secondary | ICD-10-CM | POA: Diagnosis not present

## 2020-07-31 LAB — SARS-COV-2, NAA 2 DAY TAT

## 2020-07-31 LAB — NOVEL CORONAVIRUS, NAA: SARS-CoV-2, NAA: NOT DETECTED

## 2020-08-02 DIAGNOSIS — I4891 Unspecified atrial fibrillation: Secondary | ICD-10-CM | POA: Diagnosis not present

## 2020-08-02 DIAGNOSIS — Z462 Encounter for fitting and adjustment of other devices related to nervous system and special senses: Secondary | ICD-10-CM | POA: Diagnosis not present

## 2020-08-02 DIAGNOSIS — Z45018 Encounter for adjustment and management of other part of cardiac pacemaker: Secondary | ICD-10-CM | POA: Diagnosis not present

## 2020-08-03 DIAGNOSIS — N2581 Secondary hyperparathyroidism of renal origin: Secondary | ICD-10-CM | POA: Diagnosis not present

## 2020-08-03 DIAGNOSIS — I130 Hypertensive heart and chronic kidney disease with heart failure and stage 1 through stage 4 chronic kidney disease, or unspecified chronic kidney disease: Secondary | ICD-10-CM | POA: Diagnosis not present

## 2020-08-03 DIAGNOSIS — M1030 Gout due to renal impairment, unspecified site: Secondary | ICD-10-CM | POA: Diagnosis not present

## 2020-08-03 DIAGNOSIS — D631 Anemia in chronic kidney disease: Secondary | ICD-10-CM | POA: Diagnosis not present

## 2020-08-03 DIAGNOSIS — N183 Chronic kidney disease, stage 3 unspecified: Secondary | ICD-10-CM | POA: Diagnosis not present

## 2020-08-03 DIAGNOSIS — I5032 Chronic diastolic (congestive) heart failure: Secondary | ICD-10-CM | POA: Diagnosis not present

## 2020-08-08 DIAGNOSIS — I130 Hypertensive heart and chronic kidney disease with heart failure and stage 1 through stage 4 chronic kidney disease, or unspecified chronic kidney disease: Secondary | ICD-10-CM | POA: Diagnosis not present

## 2020-08-08 DIAGNOSIS — I5032 Chronic diastolic (congestive) heart failure: Secondary | ICD-10-CM | POA: Diagnosis not present

## 2020-08-08 DIAGNOSIS — N183 Chronic kidney disease, stage 3 unspecified: Secondary | ICD-10-CM | POA: Diagnosis not present

## 2020-08-08 DIAGNOSIS — D631 Anemia in chronic kidney disease: Secondary | ICD-10-CM | POA: Diagnosis not present

## 2020-08-08 DIAGNOSIS — N2581 Secondary hyperparathyroidism of renal origin: Secondary | ICD-10-CM | POA: Diagnosis not present

## 2020-08-08 DIAGNOSIS — M1030 Gout due to renal impairment, unspecified site: Secondary | ICD-10-CM | POA: Diagnosis not present

## 2020-08-15 ENCOUNTER — Ambulatory Visit (INDEPENDENT_AMBULATORY_CARE_PROVIDER_SITE_OTHER): Payer: Medicare Other | Admitting: Psychology

## 2020-08-15 DIAGNOSIS — F4322 Adjustment disorder with anxiety: Secondary | ICD-10-CM

## 2020-08-16 DIAGNOSIS — N183 Chronic kidney disease, stage 3 unspecified: Secondary | ICD-10-CM | POA: Diagnosis not present

## 2020-08-16 DIAGNOSIS — D631 Anemia in chronic kidney disease: Secondary | ICD-10-CM | POA: Diagnosis not present

## 2020-08-16 DIAGNOSIS — I5032 Chronic diastolic (congestive) heart failure: Secondary | ICD-10-CM | POA: Diagnosis not present

## 2020-08-16 DIAGNOSIS — I130 Hypertensive heart and chronic kidney disease with heart failure and stage 1 through stage 4 chronic kidney disease, or unspecified chronic kidney disease: Secondary | ICD-10-CM | POA: Diagnosis not present

## 2020-08-16 DIAGNOSIS — M1030 Gout due to renal impairment, unspecified site: Secondary | ICD-10-CM | POA: Diagnosis not present

## 2020-08-16 DIAGNOSIS — N2581 Secondary hyperparathyroidism of renal origin: Secondary | ICD-10-CM | POA: Diagnosis not present

## 2020-08-19 ENCOUNTER — Telehealth: Payer: Self-pay

## 2020-08-19 NOTE — Progress Notes (Signed)
Chronic Care Management Pharmacy Assistant   Name: [name]  MRN: [id number] DOB: [birth year]  Reason for Encounter:COPD  Disease State Call.  Patient Questions:  1.  Have you seen any other providers since your last visit? No  2.  Any changes in your medicines or health? No    PCP : [name], [name], NP  Allergies:   Allergies  Allergen Reactions  . Ace Inhibitors Other (See Comments)    Reaction unknown  . 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 Amlodipine   . Warfarin Other (See Comments)    Left side brain hemorrhage  . Warfarin Sodium Other (See Comments)    Caused her to have a stroke    Medications: Outpatient Encounter Medications as of 08/19/2020  Medication Sig Note  . acetaminophen (TYLENOL) 500 MG tablet Take 500 mg by mouth every 6 (six) hours as needed for mild pain or headache.    . albuterol (PROVENTIL) (2.5 MG/3ML) 0.083% nebulizer solution Take 3 mLs (2.5 mg total) by nebulization every 6 (six) hours as needed for wheezing or shortness of breath. Dx:J45.909 03/17/2020: Has this medication but is not currently using nebullizer.   . budesonide-formoterol (SYMBICORT) 160-4.5 MCG/ACT inhaler Inhale 2 puffs into the lungs 2 (two) times daily.   . calcitRIOL (ROCALTROL) 0.25 MCG capsule Take 0.25 mcg by mouth every other day.   . carvedilol (COREG) 12.5 MG tablet Take 2 tablets (25 mg total) by mouth 2 (two) times daily. 03/12/2020: Unable to verify time  . docusate sodium (COLACE) 100 MG capsule Take 100 mg by mouth daily.   . DULoxetine (CYMBALTA) 30 MG capsule TAKE 1 CAPSULE BY MOUTH EVERY DAY   . Ferrous Sulfate (IRON PO) Take 325 mg by mouth daily.    . furosemide (LASIX) 80 MG tablet Take 1 tablet (80 mg total) by mouth 2 (two) times daily. (Patient taking differently: Take 160 mg by mouth 2 (two) times daily.) 03/17/2020: Reports taking 160 mg twice a day and states this is confirmed by  instructions on bottle.   . mycophenolate (MYFORTIC) 180 MG EC tablet Take 180 mg by mouth 2 (two) times daily.   . ondansetron (ZOFRAN) 4 MG tablet Take 1 tablet (4 mg total) by mouth every 6 (six) hours as needed for nausea.   . potassium chloride SA (K-DUR,KLOR-CON) 20 MEQ tablet Take 20 mEq by mouth daily.    . tacrolimus (PROGRAF) 1 MG capsule Take 2 mg by mouth 2 (two) times daily.    . VENTOLIN HFA 108 (90 Base) MCG/ACT inhaler Inhale 2 puffs into the lungs every 4 (four) hours as needed for wheezing or shortness of breath.     No facility-administered encounter medications on file as of 08/19/2020.    Current Diagnosis: Patient Active Problem List   Diagnosis Date Noted  . 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 Gastroesophageal reflux disease 06/29/2020  . Iron deficiency anemia 06/29/2020  . Hematoma of left lower leg 05/17/2020  . SOB (shortness of breath) 03/11/2020  . Pneumonia due to COVID-19 virus 03/11/2020  . Wound of right leg 01/28/2020  . Traumatic seroma of lower leg, right, subsequent encounter 01/28/2020  . Other secondary pulmonary hypertension ([place]) 12/03/2019  . Acute GI bleeding 11/14/2019  . GI bleed 11/14/2019  . Medication management 11/04/2019  . Acute CHF (congestive heart failure) ([place]) 10/23/2019  . Acute on chronic combined systolic and  diastolic congestive heart failure ([place]) 10/22/2019  . S/P AV nodal ablation 10/08/2018  . Cardiac pacemaker in situ 04/18/2018  . Oral mucositis 02/22/2018  . 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 Personal history of PE (pulmonary embolism) 02/22/2018  . Chronic systolic heart failure ([place]) 02/11/2018  . Hyperkalemia 02/03/2018  . Atrial fibrillation with RVR ([place]) 02/03/2018  . Lower extremity edema 12/10/2017  . Acute diastolic (congestive) heart failure ([place]) 11/17/2017  . UTI (urinary tract infection) 11/03/2017  . Diarrhea 11/03/2017  . Presence of Watchman left atrial appendage closure device  10/04/2017  . Chronic respiratory failure with hypoxia ([place]) 10/03/2017  . Persistent atrial fibrillation ([place]) 08/12/2017  . Dyspnea 07/26/2017  . Atrial flutter ([place])   . Peripheral vascular disease ([place])   . Oligouria   . Hypertension   . Fractures, stress   . Eczema   . Distal radius fracture, right   . Depression   . Constipation   . Arthritis   . Exacerbation of asthma 04/01/2017  . Closed fracture of right distal radius 10/18/2016  . Proteinuria 09/04/2016  . 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 Nonischemic dilated cardiomyopathy ([place])   . S/P insertion of IVC (inferior vena caval) filter 04/07/2013  . Use of cane as ambulatory aid 04/07/2013  . H/O: stroke 04/07/2013  . Expected blood loss anemia 03/26/2012  . S/P hip replacement 03/25/2012  . DVT of lower extremity, bilateral ([place]) 12/21/2011  . Personal history of DVT (deep vein thrombosis) 12/08/2011  . Obstructive sleep apnea 12/08/2011  . 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 Pseudomonas aeruginosa infection 10/24/2011  . 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 Hypercalcemia   . Gout 09/10/2011  . Absence of right hip joint with antibiotic pacer 09/03/2011  . Traumatic seroma of thigh ([place]) 06/11/2011  . Clostridium difficile colitis 06/09/2011  . Leukocytosis 06/08/2011  . Anemia 06/06/2011  . SINUSITIS- ACUTE-NOS 09/27/2010  . PAF (paroxysmal atrial fibrillation) ([place]) 11/25/2008  . RENAL FAILURE, ACUTE 11/25/2008  . CVA 09/28/2008  . CVA (cerebrovascular accident due to intracerebral hemorrhage) ([place]) 09/28/2008  . Hemiplegia ([place]) 09/21/2008  . Psoriasis 10/20/2007  . Stroke due to intracerebral hemorrhage ([place]) 07/17/2007  . Stroke ([place]) 07/17/2007  . Asthmatic bronchitis without complication 63/07/6008    Goals Addressed   None    . Current COPD regimen:  ? Albuterol 0.083% nebulizer ? Symbicort  160-4.5 MCG/ACT inhaler ? Ventolin HFA 108 MCG/ACT inhaler  . No flowsheet data found. . Any recent hospitalizations or ED visits since last visit with CPP? No . What recent interventions/DTPs have been made by any provider to improve breathing since last visit: None ID   Adherence Review: . Does the patient have >5 day gap between last estimated fill date for maintenance inhaler medications? Yes   I have attempted without success to contact this patient by phone three times to do her Coped Disease State call. I left a Voice message for patient to return my call.  Follow-Up:  Pharmacist Review   Bessie Minden Pharmacist Assistant [phone]

## 2020-08-31 ENCOUNTER — Other Ambulatory Visit: Payer: Self-pay | Admitting: Family

## 2020-08-31 DIAGNOSIS — F322 Major depressive disorder, single episode, severe without psychotic features: Secondary | ICD-10-CM

## 2020-09-06 ENCOUNTER — Ambulatory Visit (INDEPENDENT_AMBULATORY_CARE_PROVIDER_SITE_OTHER): Payer: Medicare Other | Admitting: Psychology

## 2020-09-06 DIAGNOSIS — F4322 Adjustment disorder with anxiety: Secondary | ICD-10-CM | POA: Diagnosis not present

## 2020-09-13 ENCOUNTER — Telehealth: Payer: Self-pay

## 2020-09-13 NOTE — Progress Notes (Signed)
Chronic Care Management Pharmacy Assistant   Name: [name]  MRN: [id number] DOB: July 07, 1946  Reason for Encounter: Medication Review/General Adherence Call.  Patient Questions:  1.  Have you seen any other providers since your last visit? No  2.  Any changes in your medicines or health? No     PCP : [name], [name], NP  Allergies:   Allergies  Allergen Reactions  . Ace Inhibitors Other (See Comments)    Reaction unknown  . 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 Amlodipine   . Warfarin Other (See Comments)    Left side brain hemorrhage  . Warfarin Sodium Other (See Comments)    Caused her to have a stroke    Medications: Outpatient Encounter Medications as of 09/13/2020  Medication Sig Note  . acetaminophen (TYLENOL) 500 MG tablet Take 500 mg by mouth every 6 (six) hours as needed for mild pain or headache.    . albuterol (PROVENTIL) (2.5 MG/3ML) 0.083% nebulizer solution Take 3 mLs (2.5 mg total) by nebulization every 6 (six) hours as needed for wheezing or shortness of breath. Dx:J45.909 03/17/2020: Has this medication but is not currently using nebullizer.   . budesonide-formoterol (SYMBICORT) 160-4.5 MCG/ACT inhaler Inhale 2 puffs into the lungs 2 (two) times daily.   . calcitRIOL (ROCALTROL) 0.25 MCG capsule Take 0.25 mcg by mouth every other day.   . carvedilol (COREG) 12.5 MG tablet Take 2 tablets (25 mg total) by mouth 2 (two) times daily. 03/12/2020: Unable to verify time  . docusate sodium (COLACE) 100 MG capsule Take 100 mg by mouth daily.   . DULoxetine (CYMBALTA) 30 MG capsule TAKE 1 CAPSULE BY MOUTH EVERY DAY   . Ferrous Sulfate (IRON PO) Take 325 mg by mouth daily.    . furosemide (LASIX) 80 MG tablet Take 1 tablet (80 mg total) by mouth 2 (two) times daily. (Patient taking differently: Take 160 mg by mouth 2 (two) times daily.) 03/17/2020: Reports taking 160 mg twice a day and states this  is confirmed by instructions on bottle.   . mycophenolate (MYFORTIC) 180 MG EC tablet Take 180 mg by mouth 2 (two) times daily.   . ondansetron (ZOFRAN) 4 MG tablet Take 1 tablet (4 mg total) by mouth every 6 (six) hours as needed for nausea.   . potassium chloride SA (K-DUR,KLOR-CON) 20 MEQ tablet Take 20 mEq by mouth daily.    . tacrolimus (PROGRAF) 1 MG capsule Take 2 mg by mouth 2 (two) times daily.    . VENTOLIN HFA 108 (90 Base) MCG/ACT inhaler Inhale 2 puffs into the lungs every 4 (four) hours as needed for wheezing or shortness of breath.     No facility-administered encounter medications on file as of 09/13/2020.    Current Diagnosis: Patient Active Problem List   Diagnosis Date Noted  . 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 Gastroesophageal reflux disease 06/29/2020  . Iron deficiency anemia 06/29/2020  . Hematoma of left lower leg 05/17/2020  . SOB (shortness of breath) 03/11/2020  . Pneumonia due to COVID-19 virus 03/11/2020  . Wound of right leg 01/28/2020  . Traumatic seroma of lower leg, right, subsequent encounter 01/28/2020  . Other secondary pulmonary hypertension ([place]) 12/03/2019  . Acute GI bleeding 11/14/2019  . GI bleed 11/14/2019  . Medication management 11/04/2019  . Acute CHF (congestive heart failure) ([place]) 10/23/2019  . Acute on chronic combined systolic  and diastolic congestive heart failure ([place]) 10/22/2019  . S/P AV nodal ablation 10/08/2018  . Cardiac pacemaker in situ 04/18/2018  . Oral mucositis 02/22/2018  . 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 Personal history of PE (pulmonary embolism) 02/22/2018  . Chronic systolic heart failure ([place]) 02/11/2018  . Hyperkalemia 02/03/2018  . Atrial fibrillation with RVR ([place]) 02/03/2018  . Lower extremity edema 12/10/2017  . Acute diastolic (congestive) heart failure ([place]) 11/17/2017  . UTI (urinary tract infection) 11/03/2017  . Diarrhea 11/03/2017  . Presence of Watchman left atrial appendage  closure device 10/04/2017  . Chronic respiratory failure with hypoxia ([place]) 10/03/2017  . Persistent atrial fibrillation ([place]) 08/12/2017  . Dyspnea 07/26/2017  . Atrial flutter ([place])   . Peripheral vascular disease ([place])   . Oligouria   . Hypertension   . Fractures, stress   . Eczema   . Distal radius fracture, right   . Depression   . Constipation   . Arthritis   . Exacerbation of asthma 04/01/2017  . Closed fracture of right distal radius 10/18/2016  . Proteinuria 09/04/2016  . 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 Immunosuppression ([place]) 07/25/2015  . Nonischemic dilated cardiomyopathy ([place])   . S/P insertion of IVC (inferior vena caval) filter 04/07/2013  . Use of cane as ambulatory aid 04/07/2013  . H/O: stroke 04/07/2013  . Expected blood loss anemia 03/26/2012  . S/P hip replacement 03/25/2012  . DVT of lower extremity, bilateral ([place]) 12/21/2011  . Personal history of DVT (deep vein thrombosis) 12/08/2011  . Obstructive sleep apnea 12/08/2011  . 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 Pseudomonas aeruginosa infection 10/24/2011  . 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 Hypercalcemia   . Gout 09/10/2011  . Absence of right hip joint with antibiotic pacer 09/03/2011  . Traumatic seroma of thigh ([place]) 06/11/2011  . Clostridium difficile colitis 06/09/2011  . Leukocytosis 06/08/2011  . Anemia 06/06/2011  . SINUSITIS- ACUTE-NOS 09/27/2010  . PAF (paroxysmal atrial fibrillation) ([place]) 11/25/2008  . RENAL FAILURE, ACUTE 11/25/2008  . CVA 09/28/2008  . CVA (cerebrovascular accident due to intracerebral hemorrhage) ([place]) 09/28/2008  . Hemiplegia ([place]) 09/21/2008  . Psoriasis 10/20/2007  . Stroke due to intracerebral hemorrhage ([place]) 07/17/2007  . Stroke ([place]) 07/17/2007  . Asthmatic bronchitis without complication 38/88/2800    Goals Addressed   None    I have attempted without success to contact this  patient by phone three times to do her general adherencecall. Unable to leave voice message due to mailbox is full 03/01,03/07,03/09  Follow-Up:  [facility] Pharmacist Assistant [phone]

## 2020-09-21 DIAGNOSIS — N390 Urinary tract infection, site not specified: Secondary | ICD-10-CM | POA: Diagnosis not present

## 2020-09-28 ENCOUNTER — Ambulatory Visit: Payer: Medicare Other | Admitting: Nurse Practitioner

## 2020-10-03 ENCOUNTER — Ambulatory Visit: Payer: Medicare Other | Admitting: Psychology

## 2020-10-05 ENCOUNTER — Ambulatory Visit: Payer: Medicare Other | Admitting: Nurse Practitioner

## 2020-10-05 DIAGNOSIS — Z0289 Encounter for other administrative examinations: Secondary | ICD-10-CM

## 2020-10-06 ENCOUNTER — Ambulatory Visit (INDEPENDENT_AMBULATORY_CARE_PROVIDER_SITE_OTHER): Payer: Medicare Other | Admitting: Psychology

## 2020-10-06 DIAGNOSIS — F4322 Adjustment disorder with anxiety: Secondary | ICD-10-CM

## 2020-10-14 ENCOUNTER — Telehealth: Payer: Self-pay

## 2020-10-14 NOTE — Progress Notes (Signed)
Chronic Care Management Pharmacy Assistant   Name: JADEYN HARGETT  MRN: [id number] DOB: September 28, 1945  Reason for Encounter:Hypertension Disease State Call.   Recent office visits:  None ID  Recent consult visits:  None ID  Hospital visits:  None in previous 6 months  Medications: Outpatient Encounter Medications as of 10/14/2020  Medication Sig Note  . acetaminophen (TYLENOL) 500 MG tablet Take 500 mg by mouth every 6 (six) hours as needed for mild pain or headache.    . albuterol (PROVENTIL) (2.5 MG/3ML) 0.083% nebulizer solution Take 3 mLs (2.5 mg total) by nebulization every 6 (six) hours as needed for wheezing or shortness of breath. Dx:J45.909 03/17/2020: Has this medication but is not currently using nebullizer.   . budesonide-formoterol (SYMBICORT) 160-4.5 MCG/ACT inhaler Inhale 2 puffs into the lungs 2 (two) times daily.   . calcitRIOL (ROCALTROL) 0.25 MCG capsule Take 0.25 mcg by mouth every other day.   . carvedilol (COREG) 12.5 MG tablet Take 2 tablets (25 mg total) by mouth 2 (two) times daily. 03/12/2020: Unable to verify time  . docusate sodium (COLACE) 100 MG capsule Take 100 mg by mouth daily.   . DULoxetine (CYMBALTA) 30 MG capsule TAKE 1 CAPSULE BY MOUTH EVERY DAY   . Ferrous Sulfate (IRON PO) Take 325 mg by mouth daily.    . furosemide (LASIX) 80 MG tablet Take 1 tablet (80 mg total) by mouth 2 (two) times daily. (Patient taking differently: Take 160 mg by mouth 2 (two) times daily.) 03/17/2020: Reports taking 160 mg twice a day and states this is confirmed by instructions on bottle.   . mycophenolate (MYFORTIC) 180 MG EC tablet Take 180 mg by mouth 2 (two) times daily.   . ondansetron (ZOFRAN) 4 MG tablet Take 1 tablet (4 mg total) by mouth every 6 (six) hours as needed for nausea.   . potassium chloride SA (K-DUR,KLOR-CON) 20 MEQ tablet Take 20 mEq by mouth daily.    . tacrolimus (PROGRAF) 1 MG capsule Take 2 mg by mouth 2 (two) times daily.    . VENTOLIN HFA 108 (90  Base) MCG/ACT inhaler Inhale 2 puffs into the lungs every 4 (four) hours as needed for wheezing or shortness of breath.     No facility-administered encounter medications on file as of 10/14/2020.    Star Rating Drugs:None ID  Reviewed chart prior to disease state call. Spoke with patient regarding BP  Recent Office Vitals: BP Readings from Last 3 Encounters:  06/29/20 120/74  05/17/20 120/80  04/12/20 130/76   Pulse Readings from Last 3 Encounters:  06/29/20 78  05/17/20 (!) 103  04/12/20 73    Wt Readings from Last 3 Encounters:  06/29/20 138 lb 12.8 oz (63 kg)  05/17/20 141 lb (64 kg)  05/05/20 134 lb (60.8 kg)     Kidney Function Lab Results  Component Value Date/Time   CREATININE 2.31 (H) 03/13/2020 11:18 AM   CREATININE 2.35 (H) 03/12/2020 09:22 AM   GFRNONAA 21 (L) 03/13/2020 11:18 AM   GFRAA 24 (L) 03/13/2020 11:18 AM    BMP Latest Ref Rng & Units 03/13/2020 03/12/2020 03/11/2020  Glucose 70 - 99 mg/dL 141(H) 116(H) -  BUN 8 - 23 mg/dL 77(H) 65(H) -  Creatinine 0.44 - 1.00 mg/dL 2.31(H) 2.35(H) 2.78(H)  BUN/Creat Ratio 12 - 28 - - -  Sodium 135 - 145 mmol/L 138 140 -  Potassium 3.5 - 5.1 mmol/L 4.6 5.4(H) -  Chloride 98 - 111 mmol/L 99 100 -  CO2 22 - 32 mmol/L 28 28 -  Calcium 8.9 - 10.3 mg/dL 8.8(L) 9.0 -    . Current antihypertensive regimen:  o Carvedilol 12.5 mg 2 tablets by mouth 2 time daily . How often are you checking your Blood Pressure? 1-2x per week . Current home BP readings:  o Patient states her blood pressure has been around 130/80,127/90,128/90. o Patient denies any headaches,dizziness,lightheadedness.Patient reports she is doing good with carvedilol. o Patient states her appetite has been different.Patient states she will want something to eat but will not eat it .Patient reports her weight has been the same. . What recent interventions/DTPs have been made by any provider to improve Blood Pressure control since last CPP Visit: None ID . Any  recent hospitalizations or ED visits since last visit with CPP? No . What diet changes have been made to improve Blood Pressure Control?  o Patient states she cooks at home without adding salt. . What exercise is being done to improve your Blood Pressure Control?  o Patient reports she walks every other day.  Adherence Review: Is the patient currently on ACE/ARB medication? No Does the patient have >5 day gap between last estimated fill dates? No   Anderson [place] Clinical Production designer, theatre/television/film [phone]

## 2020-10-17 DIAGNOSIS — Z940 Kidney transplant status: Secondary | ICD-10-CM | POA: Diagnosis not present

## 2020-10-24 ENCOUNTER — Ambulatory Visit (INDEPENDENT_AMBULATORY_CARE_PROVIDER_SITE_OTHER): Payer: Medicare Other | Admitting: Psychology

## 2020-10-24 DIAGNOSIS — F4322 Adjustment disorder with anxiety: Secondary | ICD-10-CM | POA: Diagnosis not present

## 2020-10-26 ENCOUNTER — Ambulatory Visit: Payer: Medicare Other | Admitting: Nurse Practitioner

## 2020-10-26 DIAGNOSIS — N390 Urinary tract infection, site not specified: Secondary | ICD-10-CM | POA: Diagnosis not present

## 2020-10-26 DIAGNOSIS — D631 Anemia in chronic kidney disease: Secondary | ICD-10-CM | POA: Diagnosis not present

## 2020-10-26 DIAGNOSIS — R809 Proteinuria, unspecified: Secondary | ICD-10-CM | POA: Diagnosis not present

## 2020-10-26 DIAGNOSIS — D849 Immunodeficiency, unspecified: Secondary | ICD-10-CM | POA: Diagnosis not present

## 2020-10-26 DIAGNOSIS — I129 Hypertensive chronic kidney disease with stage 1 through stage 4 chronic kidney disease, or unspecified chronic kidney disease: Secondary | ICD-10-CM | POA: Diagnosis not present

## 2020-10-26 DIAGNOSIS — N2581 Secondary hyperparathyroidism of renal origin: Secondary | ICD-10-CM | POA: Diagnosis not present

## 2020-10-26 DIAGNOSIS — N184 Chronic kidney disease, stage 4 (severe): Secondary | ICD-10-CM | POA: Diagnosis not present

## 2020-10-26 DIAGNOSIS — Z940 Kidney transplant status: Secondary | ICD-10-CM | POA: Diagnosis not present

## 2020-11-02 ENCOUNTER — Telehealth: Payer: Self-pay

## 2020-11-02 ENCOUNTER — Other Ambulatory Visit: Payer: Self-pay

## 2020-11-02 NOTE — Telephone Encounter (Signed)
Pt has 35mth f/u tommorw. She wants to know if this can be a virtual visit?   Please advise Thanks!

## 2020-11-02 NOTE — Telephone Encounter (Signed)
Pt notified and she will be coming in office tomorrow.

## 2020-11-02 NOTE — Telephone Encounter (Signed)
Only if she is able to provide her BP, weight, pulse and O2 Sat. tomorrow.

## 2020-11-03 ENCOUNTER — Encounter: Payer: Self-pay | Admitting: Nurse Practitioner

## 2020-11-03 ENCOUNTER — Ambulatory Visit (INDEPENDENT_AMBULATORY_CARE_PROVIDER_SITE_OTHER): Payer: Medicare Other | Admitting: Nurse Practitioner

## 2020-11-03 VITALS — BP 140/84 | HR 80 | Temp 97.7°F | Ht 65.0 in | Wt 142.8 lb

## 2020-11-03 DIAGNOSIS — Z136 Encounter for screening for cardiovascular disorders: Secondary | ICD-10-CM

## 2020-11-03 DIAGNOSIS — F331 Major depressive disorder, recurrent, moderate: Secondary | ICD-10-CM | POA: Diagnosis not present

## 2020-11-03 DIAGNOSIS — I10 Essential (primary) hypertension: Secondary | ICD-10-CM

## 2020-11-03 DIAGNOSIS — R739 Hyperglycemia, unspecified: Secondary | ICD-10-CM | POA: Diagnosis not present

## 2020-11-03 DIAGNOSIS — Z13220 Encounter for screening for lipoid disorders: Secondary | ICD-10-CM | POA: Diagnosis not present

## 2020-11-03 LAB — LIPID PANEL
Cholesterol: 136 mg/dL (ref 0–200)
HDL: 52.1 mg/dL (ref >39.00)
LDL Cholesterol: 69 mg/dL (ref 0–99)
NonHDL: 84.09
Total CHOL/HDL Ratio: 3
Triglycerides: 76 mg/dL (ref 0.0–149.0)
VLDL: 15.2 mg/dL (ref 0.0–40.0)

## 2020-11-03 LAB — HEMOGLOBIN A1C: Hgb A1c MFr Bld: 5.4 % (ref 4.6–6.5)

## 2020-11-03 MED ORDER — DULOXETINE HCL 30 MG PO CPEP: ORAL_CAPSULE | ORAL | 3 refills | Status: DC

## 2020-11-03 NOTE — Assessment & Plan Note (Signed)
Stable mood, but persistent lack of motivation and interest in participating in any activity outside of her home. Denies any adverse side effects with cymbalta. Denies any SI. She continues monthly sessions with therapist and states they are beneficial.  Maintain current medication Encourage to get involved in 1activity outside of your home (e.g meeting with a friend or volunteering at church etc.) F/up in 43months

## 2020-11-03 NOTE — Progress Notes (Signed)
Subjective:  Patient ID: [name], female    DOB: [birth year]  Age: 75 y.o. MRN: [id number]  CC: Follow-up (3 month f/u on HTN and weight loss)  HPI Wt Readings from Last 3 Encounters:  11/03/20 142 lb 12.8 oz (64.8 kg)  06/29/20 138 lb 12.8 oz (63 kg)  05/17/20 141 lb (64 kg)   Depression Stable mood, but persistent lack of motivation and interest in participating in any activity outside of her home. Denies any adverse side effects with cymbalta. Denies any SI. She continues monthly sessions with therapist and states they are beneficial.  Maintain current medication Encourage to get involved in 1activity outside of your home (e.g meeting with a friend or volunteering at church etc.) F/up in 81months   Hypertension BP at goal with coreg and furosemide. Also followed by cardiology and nephrology.  BP Readings from Last 3 Encounters:  11/03/20 140/84  06/29/20 120/74  05/17/20 120/80   Wt Readings from Last 3 Encounters:  11/03/20 142 lb 12.8 oz (64.8 kg)  06/29/20 138 lb 12.8 oz (63 kg)  05/17/20 141 lb (64 kg)   Reviewed recent renal function results from nephrologist. Check hgbA1c and lipid panel today.   Depression screen [facility] 2/9 11/03/2020 06/29/2020 05/05/2020  Decreased Interest 3 0 0  Down, Depressed, Hopeless 1 2 1   PHQ - 2 Score 4 2 1   Altered sleeping 0 0 -  Tired, decreased energy 3 1 -  Change in appetite 3 0 -  Feeling bad or failure about yourself  3 1 -  Trouble concentrating 0 2 -  Moving slowly or fidgety/restless 0 0 -  Suicidal thoughts 0 0 -  PHQ-9 Score 13 6 -  Difficult doing work/chores Somewhat difficult Somewhat difficult -  Some recent data might be hidden   GAD 7 : Generalized Anxiety Score 11/03/2020 06/29/2020 12/29/2019 05/29/2019  Nervous, Anxious, on Edge 1 1 2 2   Control/stop worrying 0 0 1 1  Worry too much - different things 1 0 1 1  Trouble relaxing 1 1 1  0  Restless 0 0 1 1  Easily annoyed or irritable 1 0 1 2   Afraid - awful might happen 1 1 0 1  Total GAD 7 Score 5 3 7 8   Anxiety Difficulty Somewhat difficult Somewhat difficult Somewhat difficult -    Reviewed past Medical, Social and Family history today.  Outpatient Medications Prior to Visit  Medication Sig Dispense Refill  . acetaminophen (TYLENOL) 500 MG tablet Take 500 mg by mouth every 6 (six) hours as needed for mild pain or headache.     . albuterol (PROVENTIL) (2.5 MG/3ML) 0.083% nebulizer solution Take 3 mLs (2.5 mg total) by nebulization every 6 (six) hours as needed for wheezing or shortness of breath. Dx:J45.909 150 mL 2  . budesonide-formoterol (SYMBICORT) 160-4.5 MCG/ACT inhaler Inhale 2 puffs into the lungs 2 (two) times daily. 1 Inhaler 5  . calcitRIOL (ROCALTROL) 0.25 MCG capsule Take 0.25 mcg by mouth every other day.    . carvedilol (COREG) 12.5 MG tablet Take 2 tablets (25 mg total) by mouth 2 (two) times daily.    Marland Kitchen docusate sodium (COLACE) 100 MG capsule Take 100 mg by mouth daily.    . Ferrous Sulfate (IRON PO) Take 325 mg by mouth daily.     . furosemide (LASIX) 80 MG tablet Take 1 tablet (80 mg total) by mouth 2 (two) times daily. (Patient taking differently: Take 160 mg by mouth 2 (two)  times daily.) 60 tablet 3  . mycophenolate (MYFORTIC) 180 MG EC tablet Take 180 mg by mouth 2 (two) times daily.    . ondansetron (ZOFRAN) 4 MG tablet Take 1 tablet (4 mg total) by mouth every 6 (six) hours as needed for nausea. 20 tablet 0  . potassium chloride SA (K-DUR,KLOR-CON) 20 MEQ tablet Take 20 mEq by mouth daily.     . tacrolimus (PROGRAF) 1 MG capsule Take 2 mg by mouth 2 (two) times daily.     . VENTOLIN HFA 108 (90 Base) MCG/ACT inhaler Inhale 2 puffs into the lungs every 4 (four) hours as needed for wheezing or shortness of breath.     . DULoxetine (CYMBALTA) 30 MG capsule TAKE 1 CAPSULE BY MOUTH EVERY DAY 90 capsule 1   No facility-administered medications prior to visit.    ROS See HPI  Objective:  BP 140/84 (BP  Location: Right Arm, Patient Position: Sitting, Cuff Size: Normal)   Pulse 80   Temp 97.7 F (36.5 C) (Temporal)   Ht 5\' 5"  (1.651 m)   Wt 142 lb 12.8 oz (64.8 kg)   SpO2 98%   BMI 23.76 kg/m   Physical Exam Vitals reviewed.  Cardiovascular:     Rate and Rhythm: Normal rate and regular rhythm.     Pulses: Normal pulses.     Heart sounds: Normal heart sounds.  Pulmonary:     Effort: Pulmonary effort is normal.     Breath sounds: Normal breath sounds.  Abdominal:     General: There is no distension.     Palpations: Abdomen is soft.     Tenderness: There is no abdominal tenderness.  Musculoskeletal:     Right lower leg: No edema.     Left lower leg: No edema.  Neurological:     Mental Status: She is alert and oriented to person, place, and time.  Psychiatric:        Attention and Perception: Attention normal.        Mood and Affect: Mood is depressed.        Speech: Speech normal.        Behavior: Behavior is cooperative.        Thought Content: Thought content normal.        Cognition and Memory: Cognition and memory normal.        Judgment: Judgment normal.    Assessment & Plan:  This visit occurred during the SARS-CoV-2 public health emergency.  Safety protocols were in place, including screening questions prior to the visit, additional usage of staff PPE, and extensive cleaning of exam room while observing appropriate contact time as indicated for disinfecting solutions.   Renella was seen today for follow-up.  Diagnoses and all orders for this visit:  Moderate episode of recurrent major depressive disorder (HCC) -     DULoxetine (CYMBALTA) 30 MG capsule; TAKE 1 CAPSULE BY MOUTH EVERY DAY  Hyperglycemia -     Hemoglobin A1c  Primary hypertension  Encounter for lipid screening for cardiovascular disease -     Lipid panel   Problem List Items Addressed This Visit      Cardiovascular and Mediastinum   Hypertension    BP at goal with coreg and furosemide. Also  followed by cardiology and nephrology.  BP Readings from Last 3 Encounters:  11/03/20 140/84  06/29/20 120/74  05/17/20 120/80   Wt Readings from Last 3 Encounters:  11/03/20 142 lb 12.8 oz (64.8 kg)  06/29/20 138 lb 12.8  oz (63 kg)  05/17/20 141 lb (64 kg)   Reviewed recent renal function results from nephrologist. Check hgbA1c and lipid panel today.         Other   Depression - Primary    Stable mood, but persistent lack of motivation and interest in participating in any activity outside of her home. Denies any adverse side effects with cymbalta. Denies any SI. She continues monthly sessions with therapist and states they are beneficial.  Maintain current medication Encourage to get involved in 1activity outside of your home (e.g meeting with a friend or volunteering at church etc.) F/up in 31months       Relevant Medications   DULoxetine (CYMBALTA) 30 MG capsule    Other Visit Diagnoses    Hyperglycemia       Relevant Orders   Hemoglobin A1c   Encounter for lipid screening for cardiovascular disease       Relevant Orders   Lipid panel      Follow-up: Return in about 6 months (around 05/05/2021) for HTN and depression (71mins).  Wilfred Lacy, NP

## 2020-11-03 NOTE — Patient Instructions (Addendum)
Maintain current medications Try to get involved in 1activity outside of your home (e.g meeting with a friend or volunteering at church). Go to lab for blood draw

## 2020-11-03 NOTE — Assessment & Plan Note (Signed)
BP at goal with coreg and furosemide. Also followed by cardiology and nephrology.  BP Readings from Last 3 Encounters:  11/03/20 140/84  06/29/20 120/74  05/17/20 120/80   Wt Readings from Last 3 Encounters:  11/03/20 142 lb 12.8 oz (64.8 kg)  06/29/20 138 lb 12.8 oz (63 kg)  05/17/20 141 lb (64 kg)   Reviewed recent renal function results from nephrologist. Check hgbA1c and lipid panel today.

## 2020-11-08 ENCOUNTER — Ambulatory Visit: Payer: Medicare Other | Admitting: Internal Medicine

## 2020-11-14 ENCOUNTER — Ambulatory Visit (HOSPITAL_COMMUNITY): Payer: Medicare Other | Admitting: Infectious Disease

## 2020-11-14 ENCOUNTER — Other Ambulatory Visit: Payer: Self-pay

## 2020-11-14 ENCOUNTER — Encounter: Payer: Self-pay | Admitting: Infectious Disease

## 2020-11-14 VITALS — BP 126/73 | HR 69 | Temp 97.6°F | Resp 16 | Ht 65.0 in | Wt 139.4 lb

## 2020-11-14 DIAGNOSIS — U071 COVID-19: Secondary | ICD-10-CM | POA: Diagnosis not present

## 2020-11-14 DIAGNOSIS — F4321 Adjustment disorder with depressed mood: Secondary | ICD-10-CM | POA: Diagnosis not present

## 2020-11-14 DIAGNOSIS — N390 Urinary tract infection, site not specified: Secondary | ICD-10-CM

## 2020-11-14 DIAGNOSIS — N3001 Acute cystitis with hematuria: Secondary | ICD-10-CM | POA: Diagnosis not present

## 2020-11-14 DIAGNOSIS — T8459XD Infection and inflammatory reaction due to other internal joint prosthesis, subsequent encounter: Secondary | ICD-10-CM

## 2020-11-14 DIAGNOSIS — F329 Major depressive disorder, single episode, unspecified: Secondary | ICD-10-CM | POA: Diagnosis not present

## 2020-11-14 DIAGNOSIS — J1282 Pneumonia due to coronavirus disease 2019: Secondary | ICD-10-CM | POA: Diagnosis not present

## 2020-11-14 DIAGNOSIS — R8271 Bacteriuria: Secondary | ICD-10-CM

## 2020-11-14 DIAGNOSIS — Z940 Kidney transplant status: Secondary | ICD-10-CM | POA: Diagnosis not present

## 2020-11-14 DIAGNOSIS — A0472 Enterocolitis due to Clostridium difficile, not specified as recurrent: Secondary | ICD-10-CM | POA: Diagnosis not present

## 2020-11-14 DIAGNOSIS — Z9889 Other specified postprocedural states: Secondary | ICD-10-CM

## 2020-11-14 DIAGNOSIS — Z96649 Presence of unspecified artificial hip joint: Secondary | ICD-10-CM | POA: Diagnosis not present

## 2020-11-14 HISTORY — DX: Bacteriuria: R82.71

## 2020-11-14 NOTE — Progress Notes (Signed)
Reason for Infectious Disease Consult: Recurrent urinary tract infections Requesting Physician: Harrie Jeans, MD  Subjective:    Patient ID: [name], female    DOB: March 11, 1946, 75 y.o.   MRN: [id number]  HPI  [name] is a 75 year old black woman who I took care of in 2013 when she had a prosthetic hip infection with Pseudomonas aeruginosa.  She also had a periprosthetic fracture at that time.  Her prosthetic hip was removed and she had an antibiotic spacer placed and she was treated with 6 weeks of ceftazidime.  She then later was found to have a abscess in June 2013 near the prosthesis.  She underwent I&D on June 7.  Cultures did not yield any organism and she was maintained on ciprofloxacin,  Seen by my partner Dr. Baxter Flattery.  In the 4 months that went on in between she developed worsening renal disease and also ultimately became dialysis dependent.  When Dr. Baxter Flattery last saw her he was reassured by inflammatory markers and had her finish an additional 2 weeks of antibiotics.  We have not seen her since then.  She tells me she did have successful reimplantation of a prosthesis in her left hip.  She does have a prior history of C. difficile colitis but not had any recurrence of that.  She is undergone successful renal transplantation.  She is followed by [facility].  They have noted that her urine repeatedly grows bacteria.  The culture I was sent was office pansensitive Citrobacter Koserii in January of 2022.  Labs from CKD also showed that she did have pyuria.  However on talking to [name] she tells me that she is not experiencing dysuria suprapubic pain or flank pain or any of the symptoms that are consistent with a urinary tract infection.  From talking to her and going through the labs in the clinic notes it would appear that her urine is being checked via UA and then being cultured.  She is apparently been repeatedly treated in this context.  These positive  cultures do not require treatment.  I have asked her to convey this to her team with [facility] and to have them stop checking urine cultures when she does not have symptoms and similarly not pursuing urine cultures simply because she has pyuria at times.  UTI= SYMPTOMS + pyuria (possible UTI) + positive culture. (must have ALL of these to be a UTI)  She is also suffered quite a bit of grief recently because she lost her grandson.  Apparently he was in the back of the truck that her son-in-law was driving which was carrying heavy equipment including a 500 pound refrigerator which was supposed to be secured..  Apparently it fell on her grandson and he died from this.  She has been struggling with grief since then since October she has had had some relief with one-on-one counseling and group counseling. Review of Systems  Constitutional: Negative for activity change, appetite change, chills, diaphoresis, fatigue, fever and unexpected weight change.  HENT: Negative for congestion, rhinorrhea, sinus pressure, sneezing, sore throat and trouble swallowing.   Eyes: Negative for photophobia and visual disturbance.  Respiratory: Negative for cough, chest tightness, shortness of breath, wheezing and stridor.   Cardiovascular: Negative for chest pain, palpitations and leg swelling.  Gastrointestinal: Negative for abdominal distention, abdominal pain, anal bleeding, blood in stool, constipation, diarrhea, nausea and vomiting.  Genitourinary: Negative for difficulty urinating, dysuria, flank pain and hematuria.  Musculoskeletal: Negative for arthralgias, back pain, gait problem,  joint swelling and myalgias.  Skin: Negative for color change, pallor, rash and wound.  Neurological: Negative for dizziness, tremors, weakness and light-headedness.  Hematological: Negative for adenopathy. Does not bruise/bleed easily.  Psychiatric/Behavioral: Negative for agitation, behavioral problems, confusion, decreased  concentration, dysphoric mood and sleep disturbance.       Objective:   Physical Exam Constitutional:      General: She is not in acute distress.    Appearance: Normal appearance. She is well-developed. She is not ill-appearing or diaphoretic.  HENT:     Head: Normocephalic and atraumatic.     Right Ear: Hearing and external ear normal.     Left Ear: Hearing and external ear normal.     Nose: No nasal deformity or rhinorrhea.  Eyes:     General: No scleral icterus.    Conjunctiva/sclera: Conjunctivae normal.     Right eye: Right conjunctiva is not injected.     Left eye: Left conjunctiva is not injected.     Pupils: Pupils are equal, round, and reactive to light.  Neck:     Vascular: No JVD.  Cardiovascular:     Rate and Rhythm: Normal rate and regular rhythm.     Heart sounds: S1 normal and S2 normal.  Pulmonary:     Effort: Pulmonary effort is normal. No respiratory distress.  Abdominal:     General: There is no distension.     Palpations: Abdomen is soft.     Tenderness: There is no abdominal tenderness.  Musculoskeletal:        General: Normal range of motion.     Right shoulder: Normal.     Left shoulder: Normal.     Cervical back: Normal range of motion and neck supple.     Right hip: Normal.     Left hip: Normal.     Right knee: Normal.     Left knee: Normal.  Lymphadenopathy:     Head:     Right side of head: No submandibular, preauricular or posterior auricular adenopathy.     Left side of head: No submandibular, preauricular or posterior auricular adenopathy.     Cervical: No cervical adenopathy.     Right cervical: No superficial or deep cervical adenopathy.    Left cervical: No superficial or deep cervical adenopathy.  Skin:    General: Skin is warm and dry.     Coloration: Skin is not pale.     Findings: No abrasion, bruising, ecchymosis, erythema, lesion or rash.     Nails: There is no clubbing.  Neurological:     General: No focal deficit present.      Mental Status: She is alert and oriented to person, place, and time.     Sensory: No sensory deficit.     Coordination: Coordination normal.     Gait: Gait normal.  Psychiatric:        Attention and Perception: She is attentive.        Mood and Affect: Mood normal.        Speech: Speech normal.        Behavior: Behavior normal. Behavior is cooperative.        Thought Content: Thought content normal.        Judgment: Judgment normal.           Assessment & Plan:  Recurrent urinary tract infections:   Based on the fact that she does not have symptoms these are actually not recurrent urinary tract infections but recurrent positive urine  cultures with pyuria.  The solution to this problem is to no longer check urine cultures when she is NOT having symptoms and also likely not reactive pyuria based on UA.  History of renal transplant: [facility] 1[facility].  She seems to doing relatively well.  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  Depression and grieving: She is getting appropriate therapy for this.  I spent greater than 80 minutes with the patient including greater than 50% of time in face to face counsel of the patient regarding the diagnoses listed above, reviewing all of her pertinent radiographic and laboratory data and in coordination of her care.

## 2020-11-21 ENCOUNTER — Ambulatory Visit (INDEPENDENT_AMBULATORY_CARE_PROVIDER_SITE_OTHER): Payer: Medicare Other | Admitting: Psychology

## 2020-11-21 DIAGNOSIS — F4322 Adjustment disorder with anxiety: Secondary | ICD-10-CM

## 2020-11-21 DIAGNOSIS — N184 Chronic kidney disease, stage 4 (severe): Secondary | ICD-10-CM | POA: Diagnosis not present

## 2020-11-27 DIAGNOSIS — I4891 Unspecified atrial fibrillation: Secondary | ICD-10-CM | POA: Diagnosis not present

## 2020-11-27 DIAGNOSIS — Z45018 Encounter for adjustment and management of other part of cardiac pacemaker: Secondary | ICD-10-CM | POA: Diagnosis not present

## 2020-11-28 ENCOUNTER — Telehealth: Payer: Self-pay

## 2020-11-28 DIAGNOSIS — I4819 Other persistent atrial fibrillation: Secondary | ICD-10-CM | POA: Diagnosis not present

## 2020-11-28 NOTE — Progress Notes (Signed)
Chronic Care Management Pharmacy Assistant   Name: [name]  MRN: [id number] DOB: [birth year]  Reason for Encounter:Hypertension Disease State  Call.   Recent office visits:  11/03/2020 Verna Czech NP (PCP) Started Duloxetine 30 mg capsule daily   Recent consult visits:  11/14/2020 Dr. Tommy Medal MD (Infectious Diseases) - No medication  changes noted   Hospital visits:  None in previous 6 months  Medications: Outpatient Encounter Medications as of 11/28/2020  Medication Sig Note  . acetaminophen (TYLENOL) 500 MG tablet Take 500 mg by mouth every 6 (six) hours as needed for mild pain or headache.    . albuterol (PROVENTIL) (2.5 MG/3ML) 0.083% nebulizer solution Take 3 mLs (2.5 mg total) by nebulization every 6 (six) hours as needed for wheezing or shortness of breath. Dx:J45.909 11/14/2020: Using on an as needed basis   . budesonide-formoterol (SYMBICORT) 160-4.5 MCG/ACT inhaler Inhale 2 puffs into the lungs 2 (two) times daily.   . calcitRIOL (ROCALTROL) 0.25 MCG capsule Take 0.25 mcg by mouth every other day.   . carvedilol (COREG) 12.5 MG tablet Take 2 tablets (25 mg total) by mouth 2 (two) times daily. 11/14/2020: Verified 11/14/20  . docusate sodium (COLACE) 100 MG capsule Take 100 mg by mouth daily.   . DULoxetine (CYMBALTA) 30 MG capsule TAKE 1 CAPSULE BY MOUTH EVERY DAY   . Ferrous Sulfate (IRON PO) Take 325 mg by mouth daily.    . furosemide (LASIX) 80 MG tablet Take 1 tablet (80 mg total) by mouth 2 (two) times daily. (Patient taking differently: Take 40 mg by mouth 2 (two) times daily.) 11/14/2020: Changed to 40 mg twice daily.   . mycophenolate (MYFORTIC) 180 MG EC tablet Take 180 mg by mouth 2 (two) times daily.   . ondansetron (ZOFRAN) 4 MG tablet Take 1 tablet (4 mg total) by mouth every 6 (six) hours as needed for nausea.   Marland Kitchen tacrolimus (PROGRAF) 1 MG capsule Take 2 mg by mouth 2 (two) times daily.    . VENTOLIN HFA 108 (90 Base) MCG/ACT inhaler Inhale 2 puffs into the  lungs every 4 (four) hours as needed for wheezing or shortness of breath.     No facility-administered encounter medications on file as of 11/28/2020.    Star Rating Drugs:None ID  Reviewed chart prior to disease state call. Spoke with patient regarding BP  Recent Office Vitals: BP Readings from Last 3 Encounters:  11/14/20 126/73  11/03/20 140/84  06/29/20 120/74   Pulse Readings from Last 3 Encounters:  11/14/20 69  11/03/20 80  06/29/20 78    Wt Readings from Last 3 Encounters:  11/14/20 139 lb 6.4 oz (63.2 kg)  11/03/20 142 lb 12.8 oz (64.8 kg)  06/29/20 138 lb 12.8 oz (63 kg)     Kidney Function Lab Results  Component Value Date/Time   CREATININE 2.31 (H) 03/13/2020 11:18 AM   CREATININE 2.35 (H) 03/12/2020 09:22 AM   GFRNONAA 21 (L) 03/13/2020 11:18 AM   GFRAA 24 (L) 03/13/2020 11:18 AM    BMP Latest Ref Rng & Units 03/13/2020 03/12/2020 03/11/2020  Glucose 70 - 99 mg/dL 141(H) 116(H) -  BUN 8 - 23 mg/dL 77(H) 65(H) -  Creatinine 0.44 - 1.00 mg/dL 2.31(H) 2.35(H) 2.78(H)  BUN/Creat Ratio 12 - 28 - - -  Sodium 135 - 145 mmol/L 138 140 -  Potassium 3.5 - 5.1 mmol/L 4.6 5.4(H) -  Chloride 98 - 111 mmol/L 99 100 -  CO2 22 - 32  mmol/L 28 28 -  Calcium 8.9 - 10.3 mg/dL 8.8(L) 9.0 -    . Current antihypertensive regimen:  ? Carvedilol 12.5 mg 2 tablets by mouth 2 time daily . What recent interventions/DTPs have been made by any provider to improve Blood Pressure control since last CPP Visit: None ID . Any recent hospitalizations or ED visits since last visit with CPP? No  Adherence Review: Is the patient currently on ACE/ARB medication? No Does the patient have >5 day gap between last estimated fill dates? No   I have attempted without success to contact this patient by phone three times to do her hypertension Disease State call. I left a Voice message for patient to return my call.  [place] Pharmacist Assistant [phone]    LVM  05/16,05/18,05/19

## 2020-12-01 ENCOUNTER — Encounter: Payer: Self-pay | Admitting: Nurse Practitioner

## 2020-12-01 ENCOUNTER — Telehealth: Payer: Self-pay | Admitting: Nurse Practitioner

## 2020-12-01 NOTE — Telephone Encounter (Signed)
Pt was no show 10/05/2020 due to not wanting to drive in the rain. This was the first occurrence and the fee was waived. Pt had OV 11/03/20.

## 2020-12-07 DIAGNOSIS — Z23 Encounter for immunization: Secondary | ICD-10-CM | POA: Diagnosis not present

## 2020-12-26 ENCOUNTER — Ambulatory Visit: Payer: Medicare Other | Admitting: Psychology

## 2020-12-30 ENCOUNTER — Telehealth: Payer: Self-pay

## 2020-12-30 NOTE — Progress Notes (Signed)
Chronic Care Management Pharmacy Assistant   Name: [name]  MRN: [id number] DOB: [birth year]  Reason for Encounter:Hypertension  Disease State Call.   Recent office visits:  No recent Office  Recent consult visits:  No recent [facility] visits:  None in previous 6 months  Medications: Outpatient Encounter Medications as of 12/30/2020  Medication Sig Note   acetaminophen (TYLENOL) 500 MG tablet Take 500 mg by mouth every 6 (six) hours as needed for mild pain or headache.     albuterol (PROVENTIL) (2.5 MG/3ML) 0.083% nebulizer solution Take 3 mLs (2.5 mg total) by nebulization every 6 (six) hours as needed for wheezing or shortness of breath. Dx:J45.909 11/14/2020: Using on an as needed basis    budesonide-formoterol (SYMBICORT) 160-4.5 MCG/ACT inhaler Inhale 2 puffs into the lungs 2 (two) times daily.    calcitRIOL (ROCALTROL) 0.25 MCG capsule Take 0.25 mcg by mouth every other day.    carvedilol (COREG) 12.5 MG tablet Take 2 tablets (25 mg total) by mouth 2 (two) times daily. 11/14/2020: Verified 11/14/20   docusate sodium (COLACE) 100 MG capsule Take 100 mg by mouth daily.    DULoxetine (CYMBALTA) 30 MG capsule TAKE 1 CAPSULE BY MOUTH EVERY DAY    Ferrous Sulfate (IRON PO) Take 325 mg by mouth daily.     furosemide (LASIX) 80 MG tablet Take 1 tablet (80 mg total) by mouth 2 (two) times daily. (Patient taking differently: Take 40 mg by mouth 2 (two) times daily.) 11/14/2020: Changed to 40 mg twice daily.    mycophenolate (MYFORTIC) 180 MG EC tablet Take 180 mg by mouth 2 (two) times daily.    ondansetron (ZOFRAN) 4 MG tablet Take 1 tablet (4 mg total) by mouth every 6 (six) hours as needed for nausea.    tacrolimus (PROGRAF) 1 MG capsule Take 2 mg by mouth 2 (two) times daily.     VENTOLIN HFA 108 (90 Base) MCG/ACT inhaler Inhale 2 puffs into the lungs every 4 (four) hours as needed for wheezing or shortness of breath.     No facility-administered encounter medications  on file as of 12/30/2020.    Care Gaps: Annual Wellness Visit Star Rating Drugs: None ID  Reviewed chart prior to disease state call. Spoke with patient regarding BP  Recent Office Vitals: BP Readings from Last 3 Encounters:  11/14/20 126/73  11/03/20 140/84  06/29/20 120/74   Pulse Readings from Last 3 Encounters:  11/14/20 69  11/03/20 80  06/29/20 78    Wt Readings from Last 3 Encounters:  11/14/20 139 lb 6.4 oz (63.2 kg)  11/03/20 142 lb 12.8 oz (64.8 kg)  06/29/20 138 lb 12.8 oz (63 kg)     Kidney Function Lab Results  Component Value Date/Time   CREATININE 2.31 (H) 03/13/2020 11:18 AM   CREATININE 2.35 (H) 03/12/2020 09:22 AM   GFRNONAA 21 (L) 03/13/2020 11:18 AM   GFRAA 24 (L) 03/13/2020 11:18 AM    BMP Latest Ref Rng & Units 03/13/2020 03/12/2020 03/11/2020  Glucose 70 - 99 mg/dL 141(H) 116(H) -  BUN 8 - 23 mg/dL 77(H) 65(H) -  Creatinine 0.44 - 1.00 mg/dL 2.31(H) 2.35(H) 2.78(H)  BUN/Creat Ratio 12 - 28 - - -  Sodium 135 - 145 mmol/L 138 140 -  Potassium 3.5 - 5.1 mmol/L 4.6 5.4(H) -  Chloride 98 - 111 mmol/L 99 100 -  CO2 22 - 32 mmol/L 28 28 -  Calcium 8.9 - 10.3 mg/dL 8.8(L) 9.0 -  Current antihypertensive regimen:  Carvedilol 12.5 mg 2 tablets 2 times daily  Furosemide 80 mg BID (kidney) Patient denies lightheadedness,dizziness, headaches or any side effects from Carvedilol or Furosemide.  How often are you checking your Blood Pressure? when feeling symptomatic Current home BP readings: None ID What recent interventions/DTPs have been made by any provider to improve Blood Pressure control since last CPP Visit: None ID Any recent hospitalizations or ED visits since last visit with CPP? No What diet changes have been made to improve Blood Pressure Control?  Patient states she adds very little salt to her food.Patient reports she cooks a lot of vegetables.  What exercise is being done to improve your Blood Pressure Control?  Patient states she does  not exercise.  Adherence Review: Is the patient currently on ACE/ARB medication? No Does the patient have >5 day gap between last estimated fill dates? No   Patient is schedule for a wellness visit on 05/09/2021 at her PCP office.  Schedule a telephone follow up with the clinical pharmacist on 02/28/2021 at 11:00 am.Sent message to scheduler.   [place] Pharmacist Assistant [phone]

## 2021-01-05 ENCOUNTER — Telehealth (INDEPENDENT_AMBULATORY_CARE_PROVIDER_SITE_OTHER): Payer: Medicare Other | Admitting: Family Medicine

## 2021-01-05 ENCOUNTER — Encounter: Payer: Self-pay | Admitting: Family Medicine

## 2021-01-05 VITALS — Ht 65.0 in | Wt 125.0 lb

## 2021-01-05 DIAGNOSIS — J4540 Moderate persistent asthma, uncomplicated: Secondary | ICD-10-CM

## 2021-01-05 DIAGNOSIS — I5043 Acute on chronic combined systolic (congestive) and diastolic (congestive) heart failure: Secondary | ICD-10-CM

## 2021-01-05 DIAGNOSIS — J209 Acute bronchitis, unspecified: Secondary | ICD-10-CM | POA: Diagnosis not present

## 2021-01-05 MED ORDER — HYDROCODONE BIT-HOMATROP MBR 5-1.5 MG/5ML PO SOLN: 5.0000 mL | Freq: Three times a day (TID) | ORAL | 0 refills | Status: DC | PRN

## 2021-01-05 NOTE — Progress Notes (Signed)
[facility] [address] [place] [address] Dept: [phone] Dept Fax: [phone]  Virtual Video Visit  I connected with [name] on 01/05/21 at 11:30 AM EDT by a video enabled telemedicine application and verified that I am speaking with the correct person using two identifiers.  Location patient: Home Location provider: Clinic Persons participating in the virtual visit: Patient, Provider  I discussed the limitations of evaluation and management by telemedicine and the availability of in person appointments. The patient expressed understanding and agreed to proceed.  Chief Complaint  Patient presents with   Acute Visit    C/o having a persistent cough, loss her voice x 1 week. She has been using cough drops, and Daytime Cold/Flu med.   Neg home covid test.     SUBJECTIVE:  HPI: [name] is a 75 y.o. female who presents with a 1-week history of cough. She notes that Her cough is deep and productive of phlegm. She did have some hoarseness, but this has since resolved. The cough is worse at night. She is using  a multi-symptom syrup that contains acetaminophen, phenylephrine, dextromethorphan, and guaifenesin. She has a history of CHF. She weighs herself daily. She notes her weight is down and she denies any lower leg edema. She has a history of asthma and notes some mild wheezing. She is using her albuterol inhaler.   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   Gastroesophageal reflux disease 06/29/2020   Iron deficiency anemia 06/29/2020   Hematoma of left lower leg 05/17/2020   SOB (shortness of breath) 03/11/2020   Pneumonia due to COVID-19 virus 03/11/2020   Wound of right leg 01/28/2020   Traumatic seroma of lower leg, right, subsequent encounter 01/28/2020   Other secondary pulmonary hypertension ([place]) 12/03/2019    Acute GI bleeding 11/14/2019   GI bleed 11/14/2019   Medication management 11/04/2019   Acute CHF (congestive heart failure) ([place]) 10/23/2019   Acute on chronic combined systolic and diastolic congestive heart failure ([place]) 10/22/2019   S/P AV nodal ablation 10/08/2018   Cardiac pacemaker in situ 04/18/2018   Oral mucositis 02/22/2018   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   Personal history of PE (pulmonary embolism) 03/54/6568   Chronic systolic heart failure ([place]) 02/11/2018   Hyperkalemia 02/03/2018   Atrial fibrillation with RVR ([place]) 02/03/2018   Lower extremity edema 12/75/1700   Acute diastolic (congestive) heart failure ([place]) 11/17/2017   UTI (urinary tract infection) 11/03/2017   Diarrhea 11/03/2017   Presence of Watchman left atrial appendage closure device 10/04/2017   Chronic respiratory failure with hypoxia ([place]) 10/03/2017   Persistent atrial fibrillation ([place]) 08/12/2017   Dyspnea 07/26/2017   Atrial flutter ([place])    Peripheral vascular disease ([place])    Oligouria    Hypertension    Fractures, stress    Eczema    Distal radius fracture, right    Depression    Constipation    Arthritis    Exacerbation of asthma 04/01/2017   Closed fracture of right distal radius 10/18/2016   Proteinuria 09/04/2016   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/25/2015   Immunosuppression ([place]) 07/25/2015   Nonischemic dilated cardiomyopathy ([place])    S/P insertion of IVC (inferior vena caval) filter 04/07/2013   Use of cane as ambulatory aid 04/07/2013   H/O: stroke 04/07/2013   Expected blood loss anemia 03/26/2012   S/P hip  replacement 03/25/2012   DVT of lower extremity, bilateral ([place]) 12/21/2011   Personal history of DVT (deep vein thrombosis) 12/08/2011   Obstructive sleep apnea 12/08/2011   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   Pseudomonas aeruginosa infection 10/24/2011   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 ([place]) 09/14/2011    Hypercalcemia    Gout 09/10/2011   Absence of right hip joint with antibiotic pacer 09/03/2011   Traumatic seroma of thigh ([place]) 06/11/2011   Clostridium difficile colitis 06/09/2011   Leukocytosis 06/08/2011   Anemia 06/06/2011   SINUSITIS- ACUTE-NOS 09/27/2010   PAF (paroxysmal atrial fibrillation) ([place]) 11/25/2008   RENAL FAILURE, ACUTE 11/25/2008   CVA 09/28/2008   CVA (cerebrovascular accident due to intracerebral hemorrhage) ([place]) 09/28/2008   Hemiplegia ([place]) 09/21/2008   Psoriasis 10/20/2007   Stroke due to intracerebral hemorrhage ([place]) 07/17/2007   Stroke ([place]) 07/17/2007   Asthmatic bronchitis without complication 78/29/5621    Past Surgical History:  Procedure Laterality Date   AV FISTULA PLACEMENT  09/28/2011   Procedure: ARTERIOVENOUS (AV) FISTULA CREATION;  Surgeon: Rosetta Posner, MD;  Location: [facility] OR;  Service: Vascular;  Laterality: Left;   [place] DEFECT   CHOLECYSTECTOMY     1980's   COLONOSCOPY     COLONOSCOPY WITH PROPOFOL N/A 11/16/2019   Procedure: COLONOSCOPY WITH PROPOFOL;  Surgeon: Juanita Craver, MD;  Location: [facility] ENDOSCOPY;  Service: Endoscopy;  Laterality: N/A;   CYSTOSCOPY     many yrs ago   ESOPHAGOGASTRODUODENOSCOPY (EGD) WITH PROPOFOL N/A 11/16/2019   Procedure: ESOPHAGOGASTRODUODENOSCOPY (EGD) WITH PROPOFOL;  Surgeon: Juanita Craver, MD;  Location: [facility] ENDOSCOPY;  Service: Endoscopy;  Laterality: N/A;   FEMUR FRACTURE SURGERY Right 03/2011; 09/03/2011   "had 2, 2 wk apart in 2012; broke it again 08/2011 & had OR"   FRACTURE SURGERY     I & D EXTREMITY  09/15/2011   Procedure: IRRIGATION AND DEBRIDEMENT EXTREMITY w REMOVAL OF HARDWARE;  Surgeon: [name], MD;  Location: [place] [facility];  Service: Orthopedics;  Laterality: Right;   INCISION AND DRAINAGE HIP  12/21/2011   Procedure: IRRIGATION AND DEBRIDEMENT HIP;  Surgeon: [name], MD;  Location: [place];  Service: Orthopedics;  Laterality:  Right;  I&D RIGHT HIP WITH PLACEMENT ANTIBIOTIC SPACER   INSERTION OF DIALYSIS CATHETER     Procedure: INSERTION OF DIALYSIS CATHETER;  Surgeon: Rosetta Posner, MD;  Location: [place];  Service: Vascular;  Laterality: Right;   IRRIGATION AND DEBRIDEMENT ABSCESS  12/21/11   right hip   JOINT REPLACEMENT     KIDNEY TRANSPLANT  07/19/15   LAPAROSCOPIC GASTRIC BANDING  2008   LEFT ATRIAL APPENDAGE OCCLUSION  10/04/2017   OPEN REDUCTION INTERNAL FIXATION (ORIF) DISTAL RADIAL FRACTURE Right 10/18/2016   Procedure: OPEN REDUCTION INTERNAL FIXATION (ORIF) RIGHT DISTAL RADIAL FRACTURE;  Surgeon: Marchia Bond, MD;  Location: [facility];  Service: Orthopedics;  Laterality: Right;   TOTAL HIP ARTHROPLASTY Right 02/2011   right THA 02/2011, I&D/removal of hardware 09/2011,, repeat I&D Jun 2013, reimplantation R THA 03-26-2012   TOTAL HIP REVISION  03/25/2012   Procedure: TOTAL HIP REVISION;  Surgeon: [name], MD;  [facility];  Service: Orthopedics;  Laterality: Right;  Right Total Hip Reimplantation   TUBAL LIGATION     VENA CAVA FILTER PLACEMENT  11/2011   Family History  Problem Relation Age of Onset  Cancer Father    Diabetes Mother    Hypertension Mother    Arthritis Mother    Hodgkin's lymphoma Other 53       dscd---HODGKINS DISEASE   Hypertension Brother    Social History   Tobacco Use   Smoking status: Never   Smokeless tobacco: Never  Vaping Use   Vaping Use: Never used  Substance Use Topics   Alcohol use: Never   Drug use: Never    Current Outpatient Medications:    acetaminophen (TYLENOL) 500 MG tablet, Take 500 mg by mouth every 6 (six) hours as needed for mild pain or headache. , Disp: , Rfl:    albuterol (PROVENTIL) (2.5 MG/3ML) 0.083% nebulizer solution, Take 3 mLs (2.5 mg total) by nebulization every 6 (six) hours as needed for wheezing or shortness of breath. Dx:J45.909, Disp: 150 mL, Rfl: 2   calcitRIOL (ROCALTROL) 0.25 MCG capsule, Take 0.25 mcg by mouth  every other day., Disp: , Rfl:    carvedilol (COREG) 12.5 MG tablet, Take 2 tablets (25 mg total) by mouth 2 (two) times daily., Disp: , Rfl:    docusate sodium (COLACE) 100 MG capsule, Take 100 mg by mouth daily., Disp: , Rfl:    DULoxetine (CYMBALTA) 30 MG capsule, TAKE 1 CAPSULE BY MOUTH EVERY DAY, Disp: 90 capsule, Rfl: 3   Ferrous Sulfate (IRON PO), Take 325 mg by mouth daily. , Disp: , Rfl:    furosemide (LASIX) 80 MG tablet, Take 1 tablet (80 mg total) by mouth 2 (two) times daily. (Patient taking differently: Take 40 mg by mouth 2 (two) times daily.), Disp: 60 tablet, Rfl: 3   HYDROcodone bit-homatropine (HYCODAN) 5-1.5 MG/5ML syrup, Take 5 mLs by mouth every 8 (eight) hours as needed for cough., Disp: 120 mL, Rfl: 0   mycophenolate (MYFORTIC) 180 MG EC tablet, Take 180 mg by mouth 2 (two) times daily., Disp: , Rfl:    ondansetron (ZOFRAN) 4 MG tablet, Take 1 tablet (4 mg total) by mouth every 6 (six) hours as needed for nausea., Disp: 20 tablet, Rfl: 0   tacrolimus (PROGRAF) 1 MG capsule, Take 2 mg by mouth 2 (two) times daily. , Disp: , Rfl:    VENTOLIN HFA 108 (90 Base) MCG/ACT inhaler, Inhale 2 puffs into the lungs every 4 (four) hours as needed for wheezing or shortness of breath. , Disp: , Rfl:    potassium chloride (KLOR-CON) 10 MEQ tablet, Take 10 mEq by mouth daily., Disp: , Rfl:   Allergies  Allergen Reactions   Ace Inhibitors Other (See Comments)    Reaction unknown   Amiodarone Other (See Comments)    Pt with Restrictive Lung Disease by 10/2017 PFT with decreased DLCO   Amlodipine    Warfarin Other (See Comments)    Left side brain hemorrhage   Warfarin Sodium Other (See Comments)    Caused her to have a stroke   ROS: See pertinent positives and negatives per HPI.  OBSERVATIONS/OBJECTIVE:  VITALS per patient if applicable: Today's Vitals   01/05/21 1138  Weight: 125 lb (56.7 kg)  Height: 5\' 5"  (1.651 m)   Body mass index is 20.8 kg/m.   GENERAL: Alert and  oriented. Appears well and in no acute distress.  HEENT: Atraumatic. Conjunctiva clear. No obvious abnormalities on inspection of external nose and ears.  NECK: Normal movements of the head and neck.  LUNGS: On inspection, no signs of respiratory distress. Breathing rate appears normal. No obvious gross SOB, gasping or wheezing, and  no conversational dyspnea.  CV: No obvious cyanosis.  MS: Moves all visible extremities without noticeable abnormality.  PSYCH/NEURO: Pleasant and cooperative. No obvious depression or anxiety. Speech and thought processing grossly intact.  ASSESSMENT AND PLAN:  1. Acute bronchitis, unspecified organism Ms. Hearld appears to have an acute bronchitis. I will add some hydrocodone syrup at bedtime for her cough. If not improving by next week, I recommend an in-person visit to allow a lung exam to rule-ou an asthma flare.  - HYDROcodone bit-homatropine (HYCODAN) 5-1.5 MG/5ML syrup; Take 5 mLs by mouth every 8 (eight) hours as needed for cough.  Dispense: 120 mL; Refill: 0  2. Acute on chronic combined systolic and diastolic congestive heart failure (HCC) Stable. No sign of CHF decompensation. Continue carvedilol and Lasix.  3. Moderate persistent asthmatic bronchitis without complication Possible asthma flare. Patient has stopped using Symbicort. May need in-person exam to reassess if not improving.   I discussed the assessment and treatment plan with the patient. The patient was provided an opportunity to ask questions and all were answered. The patient agreed with the plan and demonstrated an understanding of the instructions.   The patient was advised to call back or seek an in-person evaluation if the symptoms worsen or if the condition fails to improve as anticipated.   Haydee Salter, MD

## 2021-01-09 ENCOUNTER — Telehealth: Payer: Self-pay

## 2021-01-09 NOTE — Telephone Encounter (Signed)
Spoke to patient and she states that twice when she took the cough medication is caused her to have some N&V.  She has continued taking it with no further symptoms.  She is still coughing some  so appt scheduled for 01/10/21 @ 11:00 am.  [facility] per provider. Dm/cma

## 2021-01-09 NOTE — Telephone Encounter (Signed)
Pt called to inform provider she saw on Friday, (Dr. Gena Fray) that the medication given on Friday had made her sick , pt explains that it made her vomit and she still has a cough.  Pt would like a CB, [phone].

## 2021-01-10 ENCOUNTER — Telehealth: Payer: Self-pay

## 2021-01-10 ENCOUNTER — Ambulatory Visit (INDEPENDENT_AMBULATORY_CARE_PROVIDER_SITE_OTHER): Payer: Medicare Other | Admitting: Family Medicine

## 2021-01-10 ENCOUNTER — Other Ambulatory Visit: Payer: Self-pay

## 2021-01-10 ENCOUNTER — Encounter: Payer: Self-pay | Admitting: Family Medicine

## 2021-01-10 VITALS — BP 152/80 | HR 72 | Temp 97.5°F | Ht 65.0 in | Wt 127.2 lb

## 2021-01-10 DIAGNOSIS — I5022 Chronic systolic (congestive) heart failure: Secondary | ICD-10-CM

## 2021-01-10 DIAGNOSIS — J209 Acute bronchitis, unspecified: Secondary | ICD-10-CM | POA: Diagnosis not present

## 2021-01-10 DIAGNOSIS — J4531 Mild persistent asthma with (acute) exacerbation: Secondary | ICD-10-CM | POA: Diagnosis not present

## 2021-01-10 MED ORDER — HYDROCODONE BIT-HOMATROP MBR 5-1.5 MG/5ML PO SOLN: 5.0000 mL | Freq: Three times a day (TID) | ORAL | 0 refills | Status: DC | PRN

## 2021-01-10 MED ORDER — FUROSEMIDE 40 MG PO TABS: 40.0000 mg | ORAL_TABLET | Freq: Two times a day (BID) | ORAL | 3 refills | Status: DC

## 2021-01-10 MED ORDER — PREDNISONE 20 MG PO TABS: 20.0000 mg | ORAL_TABLET | Freq: Every day | ORAL | 0 refills | Status: DC

## 2021-01-10 NOTE — Progress Notes (Signed)
[facility] [address] [place] [address] Dept: [phone] Dept Fax: [phone]  Office Visit  Subjective:    Patient ID: [name], female    DOB: 05-17-1946, 75 y.o..   MRN: [id number]  No chief complaint on file.   History of Present Illness:  Patient is in today for reassessment of a recent cough. I had seen her via video on 6/23 with a 1-week history of deep cough productive of phlegm. She had been using an OTC multi-symptom cough syrup. She has a history of CHF and of asthma. She was prescribed some hydrocodone cough syrup and asked to follow-up today, if her cough persisted. At this point, she continues to have coughing spells esp. at night. She has not noted any edeam and notes her weight is down some. She is using her albuterol inhaler.  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   Gastroesophageal reflux disease 06/29/2020   Iron deficiency anemia 06/29/2020   Hematoma of left lower leg 05/17/2020   SOB (shortness of breath) 03/11/2020   Pneumonia due to COVID-19 virus 03/11/2020   Wound of right leg 01/28/2020   Traumatic seroma of lower leg, right, subsequent encounter 01/28/2020   Other secondary pulmonary hypertension ([place]) 12/03/2019   Acute GI bleeding 11/14/2019   GI bleed 11/14/2019   Medication management 11/04/2019   Acute on chronic combined systolic and diastolic congestive heart failure ([place]) 10/22/2019   S/P AV nodal ablation 10/08/2018   Cardiac pacemaker in situ 04/18/2018   Oral mucositis 02/22/2018   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   Personal history of PE (pulmonary embolism) 92/05/9416   Chronic systolic heart failure ([place]) 02/11/2018   Hyperkalemia 02/03/2018   Lower extremity edema 12/10/2017   UTI (urinary tract infection) 11/03/2017   Diarrhea  11/03/2017   Presence of Watchman left atrial appendage closure device 10/04/2017   Chronic respiratory failure with hypoxia ([place]) 10/03/2017   Persistent atrial fibrillation ([place]) 08/12/2017   Dyspnea 07/26/2017   Atrial flutter ([place])    Peripheral vascular disease ([place])    Oligouria    Hypertension    Fractures, stress    Eczema    Distal radius fracture, right    Depression    Constipation    Arthritis    Exacerbation of asthma 04/01/2017   Closed fracture of right distal radius 10/18/2016   Proteinuria 09/04/2016   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/25/2015   Immunosuppression ([place]) 07/25/2015   Nonischemic dilated cardiomyopathy ([place])    S/P insertion of IVC (inferior vena caval) filter 04/07/2013   Use of cane as ambulatory aid 04/07/2013   H/O: stroke 04/07/2013   Expected blood loss anemia 03/26/2012   S/P hip replacement 03/25/2012   DVT of lower extremity, bilateral ([place]) 12/21/2011   Personal history of DVT (deep vein thrombosis) 12/08/2011   Obstructive sleep apnea 12/08/2011   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[place]) 10/24/2011   Pseudomonas aeruginosa infection 10/24/2011   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 ([place]) 09/14/2011   Hypercalcemia    Gout 09/10/2011   Absence of right hip joint with antibiotic pacer 09/03/2011   Traumatic seroma of thigh ([place]) 06/11/2011   Clostridium difficile colitis 06/09/2011   Leukocytosis 06/08/2011   Anemia 06/06/2011   SINUSITIS- ACUTE-NOS 09/27/2010   PAF (paroxysmal atrial fibrillation) ([place]) 11/25/2008   RENAL FAILURE, ACUTE 11/25/2008  CVA 09/28/2008   CVA (cerebrovascular accident due to intracerebral hemorrhage) ([place]) 09/28/2008   Hemiplegia ([place]) 09/21/2008   Psoriasis 10/20/2007   Stroke due to intracerebral hemorrhage ([place]) 07/17/2007   Stroke ([place]) 07/17/2007   Asthmatic bronchitis without complication 74/25/9563   Past Surgical History:  Procedure Laterality Date   AV FISTULA PLACEMENT   09/28/2011   Procedure: ARTERIOVENOUS (AV) FISTULA CREATION;  Surgeon: Rosetta Posner, MD;  Location: [facility] OR;  Service: Vascular;  Laterality: Left;   [place] DEFECT   CHOLECYSTECTOMY     1980's   COLONOSCOPY     COLONOSCOPY WITH PROPOFOL N/A 11/16/2019   Procedure: COLONOSCOPY WITH PROPOFOL;  Surgeon: Juanita Craver, MD;  Location: [facility] END[facility]COPY;  Service: Endoscopy;  Laterality: N/A;   CYSTOSCOPY     many yrs ago   ESOPHAGOGASTRODUODENOSCOPY (EGD) WITH PROPOFOL N/A 11/16/2019   Procedure: ESOPHAGOGASTRODUODENOSCOPY (EGD) WITH PROPOFOL;  Surgeon: Juanita Craver, MD;  Location: [facility] ENDOSCOPY;  Service: Endoscopy;  Laterality: N/A;   FEMUR FRACTURE SURGERY Right 03/2011; 09/03/2011   "had 2, 2 wk apart in 2012; broke it again 08/2011 & had OR"   FRACTURE SURGERY     I & D EXTREMITY  09/15/2011   Procedure: IRRIGATION AND DEBRIDEMENT EXTREMITY w REMOVAL OF HARDWARE;  Surgeon: [name], MD;  Location: [place];  Service: Orthopedics;  Laterality: Right;   INCISION AND DRAINAGE HIP  12/21/2011   Procedure: IRRIGATION AND DEBRIDEMENT HIP;  Surgeon: [name], MD;  Location: [facility];  Service: Orthopedics;  Laterality: Right;  I&D RIGHT HIP WITH PLACEMENT ANTIBIOTIC SPACER   INSERTION OF DIALYSIS CATHETER     Procedure: INSERTION OF DIALYSIS CATHETER;  Surgeon: Rosetta Posner, MD;  Location: [place];  Service: Vascular;  Laterality: Right;   IRRIGATION AND DEBRIDEMENT ABSCESS  12/21/11   right hip   JOINT REPLACEMENT     KIDNEY TRANSPLANT  07/19/15   LAPAROSCOPIC GASTRIC BANDING  2008   LEFT ATRIAL APPENDAGE OCCLUSION  10/04/2017   OPEN REDUCTION INTERNAL FIXATION (ORIF) DISTAL RADIAL FRACTURE Right 10/18/2016   Procedure: OPEN REDUCTION INTERNAL FIXATION (ORIF) RIGHT DISTAL RADIAL FRACTURE;  Surgeon: Marchia Bond, MD;  Location: [facility];  Service: Orthopedics;  Laterality: Right;   TOTAL HIP ARTHROPLASTY Right 02/2011   right  THA 02/2011, I&D/removal of hardware 09/2011,, repeat I&D Jun 2013, reimplantation R THA 03-26-2012   TOTAL HIP REVISION  03/25/2012   Procedure: TOTAL HIP REVISION;  Surgeon: [name], MD;  [facility];  Service: Orthopedics;  Laterality: Right;  Right Total Hip Reimplantation   TUBAL LIGATION     VENA CAVA FILTER PLACEMENT  11/2011   Family History  Problem Relation Age of Onset   Cancer Father    Diabetes Mother    Hypertension Mother    Arthritis Mother    Hodgkin's lymphoma Other 5       dscd---HODGKINS DISEASE   Hypertension Brother    Outpatient Medications Prior to Visit  Medication Sig Dispense Refill   acetaminophen (TYLENOL) 500 MG tablet Take 500 mg by mouth every 6 (six) hours as needed for mild pain or headache.      albuterol (PROVENTIL) (2.5 MG/3ML) 0.083% nebulizer solution Take 3 mLs (2.5 mg total) by nebulization every 6 (six) hours as needed for wheezing or shortness of breath. Dx:J45.909 150 mL 2   calcitRIOL (ROCALTROL) 0.25 MCG capsule Take  0.25 mcg by mouth every other day.     carvedilol (COREG) 12.5 MG tablet Take 2 tablets (25 mg total) by mouth 2 (two) times daily.     docusate sodium (COLACE) 100 MG capsule Take 100 mg by mouth daily.     DULoxetine (CYMBALTA) 30 MG capsule TAKE 1 CAPSULE BY MOUTH EVERY DAY 90 capsule 3   Ferrous Sulfate (IRON PO) Take 325 mg by mouth daily.      mycophenolate (MYFORTIC) 180 MG EC tablet Take 180 mg by mouth 2 (two) times daily.     ondansetron (ZOFRAN) 4 MG tablet Take 1 tablet (4 mg total) by mouth every 6 (six) hours as needed for nausea. 20 tablet 0   potassium chloride (KLOR-CON) 10 MEQ tablet Take 10 mEq by mouth daily.     tacrolimus (PROGRAF) 1 MG capsule Take 2 mg by mouth 2 (two) times daily.      VENTOLIN HFA 108 (90 Base) MCG/ACT inhaler Inhale 2 puffs into the lungs every 4 (four) hours as needed for wheezing or shortness of breath.      furosemide (LASIX) 80 MG tablet Take 1 tablet (80 mg total) by mouth  2 (two) times daily. (Patient taking differently: Take 40 mg by mouth 2 (two) times daily.) 60 tablet 3   HYDROcodone bit-homatropine (HYCODAN) 5-1.5 MG/5ML syrup Take 5 mLs by mouth every 8 (eight) hours as needed for cough. 120 mL 0   No facility-administered medications prior to visit.   Allergies  Allergen Reactions   Ace Inhibitors Other (See Comments)    Reaction unknown   Amiodarone Other (See Comments)    Pt with Restrictive Lung Disease by 10/2017 PFT with decreased DLCO   Amlodipine    Warfarin Other (See Comments)    Left side brain hemorrhage   Warfarin Sodium Other (See Comments)    Caused her to have a stroke   Objective:   Today's Vitals   01/10/21 1103  BP: (!) 152/80  Pulse: 72  Temp: (!) 97.5 F (36.4 C)  SpO2: 95%  Weight: 127 lb 3.2 oz (57.7 kg)  Height: 5\' 5"  (1.651 m)   Body mass index is 21.17 kg/m.   General: Well developed, well nourished. No acute distress. HEENT: Normocephalic, non-traumatic.  Mucous membranes moist, though saliva appears thick. Oropharynx clear.   Good dentition. Lungs: Clear to auscultation bilaterally. No wheezing, rales or rhonchi, but very congested cough noted. CV: RRR without murmurs or rubs. Pulses 2+ bilaterally. Extremities: No edema Psych: Alert and oriented. Normal mood and affect.  Health Maintenance Due  Topic Date Due   Zoster Vaccines- Shingrix (1 of 2) Never done   COVID-19 Vaccine (4 - Booster) 09/22/2020     Assessment & Plan:   1. Acute bronchitis, unspecified organism Ms. Romain appears to have primarily a viral bronchitis going on. Mostly she will need time for this to clear. I will renew some cough syrup.  - HYDROcodone bit-homatropine (HYCODAN) 5-1.5 MG/5ML syrup; Take 5 mLs by mouth every 8 (eight) hours as needed for cough.  Dispense: 120 mL; Refill: 0  2. Mild persistent asthma with exacerbation The cough has a bit of a wheezy quality to it. I will prescribe a 5-day course of prednisone and have  her return next week for reassessment. She should continue to use her albuterol inhaler as needed.  - predniSONE (DELTASONE) 20 MG tablet; Take 1 tablet (20 mg total) by mouth daily with breakfast.  Dispense: 5 tablet; Refill: 0  3. Chronic systolic heart failure (HCC) Stable. Weight is down. No sign of edema. She will continue her carvedilol and Lasix.  - furosemide (LASIX) 40 MG tablet; Take 1 tablet (40 mg total) by mouth 2 (two) times daily.  Dispense: 60 tablet; Refill: 3  Haydee Salter, MD

## 2021-01-10 NOTE — Telephone Encounter (Signed)
Pt calling to inform provider that she is at the pharmacy trying to pick up her medication, Hydrocodone bit -homatropine but she was told that she could not pick it up today.  I called the pharmacy and spoke with Minette Brine and she informed me that pt had picked  up a 7 day rx for this medication on 01/05/21. Pt has to wait until 01/12/21 in order for them to let her have it.  I called pt back to explain this to her but I did not get an answer.

## 2021-01-17 DIAGNOSIS — N184 Chronic kidney disease, stage 4 (severe): Secondary | ICD-10-CM | POA: Diagnosis not present

## 2021-01-17 DIAGNOSIS — Z940 Kidney transplant status: Secondary | ICD-10-CM | POA: Diagnosis not present

## 2021-01-17 DIAGNOSIS — N390 Urinary tract infection, site not specified: Secondary | ICD-10-CM | POA: Diagnosis not present

## 2021-01-24 DIAGNOSIS — M25552 Pain in left hip: Secondary | ICD-10-CM | POA: Diagnosis not present

## 2021-01-24 DIAGNOSIS — M545 Low back pain, unspecified: Secondary | ICD-10-CM | POA: Diagnosis not present

## 2021-01-25 DIAGNOSIS — D849 Immunodeficiency, unspecified: Secondary | ICD-10-CM | POA: Diagnosis not present

## 2021-01-25 DIAGNOSIS — D631 Anemia in chronic kidney disease: Secondary | ICD-10-CM | POA: Diagnosis not present

## 2021-01-25 DIAGNOSIS — N184 Chronic kidney disease, stage 4 (severe): Secondary | ICD-10-CM | POA: Diagnosis not present

## 2021-01-25 DIAGNOSIS — N2581 Secondary hyperparathyroidism of renal origin: Secondary | ICD-10-CM | POA: Diagnosis not present

## 2021-01-25 DIAGNOSIS — Z940 Kidney transplant status: Secondary | ICD-10-CM | POA: Diagnosis not present

## 2021-01-25 DIAGNOSIS — R809 Proteinuria, unspecified: Secondary | ICD-10-CM | POA: Diagnosis not present

## 2021-01-25 DIAGNOSIS — I129 Hypertensive chronic kidney disease with stage 1 through stage 4 chronic kidney disease, or unspecified chronic kidney disease: Secondary | ICD-10-CM | POA: Diagnosis not present

## 2021-01-25 DIAGNOSIS — E876 Hypokalemia: Secondary | ICD-10-CM | POA: Diagnosis not present

## 2021-01-26 ENCOUNTER — Ambulatory Visit (INDEPENDENT_AMBULATORY_CARE_PROVIDER_SITE_OTHER): Payer: Medicare Other | Admitting: Family Medicine

## 2021-01-26 ENCOUNTER — Encounter: Payer: Self-pay | Admitting: Family Medicine

## 2021-01-26 ENCOUNTER — Other Ambulatory Visit: Payer: Self-pay

## 2021-01-26 VITALS — BP 142/76 | HR 74 | Temp 97.7°F | Ht 65.5 in | Wt 137.4 lb

## 2021-01-26 DIAGNOSIS — I5022 Chronic systolic (congestive) heart failure: Secondary | ICD-10-CM

## 2021-01-26 DIAGNOSIS — Z8709 Personal history of other diseases of the respiratory system: Secondary | ICD-10-CM | POA: Diagnosis not present

## 2021-01-26 DIAGNOSIS — J4530 Mild persistent asthma, uncomplicated: Secondary | ICD-10-CM

## 2021-01-26 DIAGNOSIS — N185 Chronic kidney disease, stage 5: Secondary | ICD-10-CM

## 2021-01-26 DIAGNOSIS — J209 Acute bronchitis, unspecified: Secondary | ICD-10-CM

## 2021-01-26 DIAGNOSIS — I120 Hypertensive chronic kidney disease with stage 5 chronic kidney disease or end stage renal disease: Secondary | ICD-10-CM

## 2021-01-26 DIAGNOSIS — J4531 Mild persistent asthma with (acute) exacerbation: Secondary | ICD-10-CM

## 2021-01-26 NOTE — Progress Notes (Signed)
[facility] [address] [place] [address] Dept: [phone] Dept Fax: [phone]  Office Visit  Subjective:    Patient ID: [name], female    DOB: 1945-11-17, 74 y.o..   MRN: [id number]  Chief Complaint  Patient presents with   Follow-up    1 week f/u.     History of Present Illness:  Patient is in today for reassessment of her acute bronchitis and asthma exacerbation. [name] notes that she is breathing much better at this point. She has completed her course of steroids. She continues to use her inhalers. She denies any significant change in lower leg edema. [name] was seen by her nephrologist at [facility] yesterday. They noted an increase in her creatinine, so added amlodipine 2.5 mg daily to her regimen to try and improve her blood pressure.  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   Gastroesophageal reflux disease 06/29/2020   Iron deficiency anemia 06/29/2020   Hematoma of left lower leg 05/17/2020   SOB (shortness of breath) 03/11/2020   Pneumonia due to COVID-19 virus 03/11/2020   Wound of right leg 01/28/2020   Traumatic seroma of lower leg, right, subsequent encounter 01/28/2020   Other secondary pulmonary hypertension ([place]) 12/03/2019   Acute GI bleeding 11/14/2019   GI bleed 11/14/2019   Medication management 11/04/2019   Acute on chronic combined systolic and diastolic congestive heart failure ([place]) 10/22/2019   S/P AV nodal ablation 10/08/2018   Cardiac pacemaker in situ 04/18/2018   Oral mucositis 02/22/2018   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   Personal history of PE (pulmonary embolism) 37/16/9678   Chronic systolic heart failure ([place]) 02/11/2018   Hyperkalemia 02/03/2018   Lower extremity edema 12/10/2017   UTI (urinary tract infection)  11/03/2017   Diarrhea 11/03/2017   Presence of Watchman left atrial appendage closure device 10/04/2017   Chronic respiratory failure with hypoxia ([place]) 10/03/2017   Persistent atrial fibrillation ([place]) 08/12/2017   Dyspnea 07/26/2017   Atrial flutter ([place])    Peripheral vascular disease ([place])    Oligouria    Hypertension    Fractures, stress    Eczema    Distal radius fracture, right    Depression    Constipation    Arthritis    Exacerbation of asthma 04/01/2017   Closed fracture of right distal radius 10/18/2016   Proteinuria 09/04/2016   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/25/2015   Immunosuppression ([place]) 07/25/2015   Nonischemic dilated cardiomyopathy ([place])    S/P insertion of IVC (inferior vena caval) filter 04/07/2013   Use of cane as ambulatory aid 04/07/2013   H/O: stroke 04/07/2013   Expected blood loss anemia 03/26/2012   S/P hip replacement 03/25/2012   DVT of lower extremity, bilateral ([place]) 12/21/2011   Personal history of DVT (deep vein thrombosis) 12/08/2011   Obstructive sleep apnea 12/08/2011   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[place]) 10/24/2011   Pseudomonas aeruginosa infection 10/24/2011   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 ([place]) 09/14/2011   Hypercalcemia    Gout 09/10/2011   Absence of right hip joint with antibiotic pacer 09/03/2011   Traumatic seroma of thigh ([place]) 06/11/2011   Clostridium difficile colitis 06/09/2011   Leukocytosis 06/08/2011   Anemia 06/06/2011   SINUSITIS- ACUTE-NOS 09/27/2010   PAF (paroxysmal atrial fibrillation) ([place]) 11/25/2008   RENAL FAILURE, ACUTE 11/25/2008  CVA 09/28/2008   CVA (cerebrovascular accident due to intracerebral hemorrhage) ([place]) 09/28/2008   Hemiplegia ([place]) 09/21/2008   Psoriasis 10/20/2007   Stroke due to intracerebral hemorrhage ([place]) 07/17/2007   Stroke ([place]) 07/17/2007   Asthmatic bronchitis without complication 63/07/6008   Past Surgical History:  Procedure Laterality Date    AV FISTULA PLACEMENT  09/28/2011   Procedure: ARTERIOVENOUS (AV) FISTULA CREATION;  Surgeon: Rosetta Posner, MD;  Location: [facility] OR;  Service: Vascular;  Laterality: Left;   [facility] DEFECT   CHOLECYSTECTOMY     1980's   COLONOSCOPY     COLONOSCOPY WITH PROPOFOL N/A 11/16/2019   Procedure: COLONOSCOPY WITH PROPOFOL;  Surgeon: Juanita Craver, MD;  Location: [facility] ENDOSCOPY;  Service: Endoscopy;  Laterality: N/A;   CYSTOSCOPY     many yrs ago   ESOPHAGOGASTRODUODENOSCOPY (EGD) WITH PROPOFOL N/A 11/16/2019   Procedure: ESOPHAGOGASTRODUODENOSCOPY (EGD) WITH PROPOFOL;  Surgeon: Juanita Craver, MD;  Location: [facility] ENDOSCOPY;  Service: Endoscopy;  Laterality: N/A;   FEMUR FRACTURE SURGERY Right 03/2011; 09/03/2011   "had 2, 2 wk apart in 2012; broke it again 08/2011 & had OR"   FRACTURE SURGERY     I & D EXTREMITY  09/15/2011   Procedure: IRRIGATION AND DEBRIDEMENT EXTREMITY w REMOVAL OF HARDWARE;  Surgeon: [name], MD;  Location: [place];  Service: Orthopedics;  Laterality: Right;   INCISION AND DRAINAGE HIP  12/21/2011   Procedure: IRRIGATION AND DEBRIDEMENT HIP;  Surgeon: [name], MD;  Location: [place];  Service: Orthopedics;  Laterality: Right;  I&D RIGHT HIP WITH PLACEMENT ANTIBIOTIC SPACER   INSERTION OF DIALYSIS CATHETER     Procedure: INSERTION OF DIALYSIS CATHETER;  Surgeon: Rosetta Posner, MD;  Location: [place];  Service: Vascular;  Laterality: Right;   IRRIGATION AND DEBRIDEMENT ABSCESS  12/21/11   right hip   JOINT REPLACEMENT     KIDNEY TRANSPLANT  07/19/15   LAPAROSCOPIC GASTRIC BANDING  2008   LEFT ATRIAL APPENDAGE OCCLUSION  10/04/2017   OPEN REDUCTION INTERNAL FIXATION (ORIF) DISTAL RADIAL FRACTURE Right 10/18/2016   Procedure: OPEN REDUCTION INTERNAL FIXATION (ORIF) RIGHT DISTAL RADIAL FRACTURE;  Surgeon: Marchia Bond, MD;  Location: [place];  Service: Orthopedics;  Laterality: Right;   TOTAL HIP ARTHROPLASTY  Right 02/2011   right THA 02/2011, I&D/removal of hardware 09/2011,, repeat I&D Jun 2013, reimplantation R THA 03-26-2012   TOTAL HIP REVISION  03/25/2012   Procedure: TOTAL HIP REVISION;  Surgeon: [name], MD;  Location: [facility] ORS;  Service: Orthopedics;  Laterality: Right;  Right Total Hip Reimplantation   TUBAL LIGATION     VENA CAVA FILTER PLACEMENT  11/2011   Family History  Problem Relation Age of Onset   Cancer Father    Diabetes Mother    Hypertension Mother    Arthritis Mother    Hodgkin's lymphoma Other 28       dscd---HODGKINS DISEASE   Hypertension Brother    Outpatient Medications Prior to Visit  Medication Sig Dispense Refill   acetaminophen (TYLENOL) 500 MG tablet Take 500 mg by mouth every 6 (six) hours as needed for mild pain or headache.      albuterol (PROVENTIL) (2.5 MG/3ML) 0.083% nebulizer solution Take 3 mLs (2.5 mg total) by nebulization every 6 (six) hours as needed for wheezing or shortness of breath. Dx:J45.909 150 mL 2   amLODipine (NORVASC) 2.5 MG tablet Take  2.5 mg by mouth daily.     calcitRIOL (ROCALTROL) 0.25 MCG capsule Take 0.25 mcg by mouth every other day.     carvedilol (COREG) 12.5 MG tablet Take 2 tablets (25 mg total) by mouth 2 (two) times daily.     docusate sodium (COLACE) 100 MG capsule Take 100 mg by mouth daily.     DULoxetine (CYMBALTA) 30 MG capsule TAKE 1 CAPSULE BY MOUTH EVERY DAY 90 capsule 3   Ferrous Sulfate (IRON PO) Take 325 mg by mouth daily.      furosemide (LASIX) 40 MG tablet Take 1 tablet (40 mg total) by mouth 2 (two) times daily. 60 tablet 3   HYDROcodone bit-homatropine (HYCODAN) 5-1.5 MG/5ML syrup Take 5 mLs by mouth every 8 (eight) hours as needed for cough. 120 mL 0   mycophenolate (MYFORTIC) 180 MG EC tablet Take 180 mg by mouth 2 (two) times daily.     ondansetron (ZOFRAN) 4 MG tablet Take 1 tablet (4 mg total) by mouth every 6 (six) hours as needed for nausea. 20 tablet 0   potassium chloride (KLOR-CON) 10 MEQ tablet  Take 10 mEq by mouth daily.     predniSONE (DELTASONE) 20 MG tablet Take 1 tablet (20 mg total) by mouth daily with breakfast. 5 tablet 0   tacrolimus (PROGRAF) 1 MG capsule Take 2 mg by mouth 2 (two) times daily.      VENTOLIN HFA 108 (90 Base) MCG/ACT inhaler Inhale 2 puffs into the lungs every 4 (four) hours as needed for wheezing or shortness of breath.      No facility-administered medications prior to visit.    Allergies  Allergen Reactions   Ace Inhibitors Other (See Comments)    Reaction unknown   Amiodarone Other (See Comments)    Pt with Restrictive Lung Disease by 10/2017 PFT with decreased DLCO   Amlodipine    Warfarin Other (See Comments)    Left side brain hemorrhage   Warfarin Sodium Other (See Comments)    Caused her to have a stroke   Objective:   Today's Vitals   01/26/21 1058  BP: (!) 142/76  Pulse: 74  Temp: 97.7 F (36.5 C)  TempSrc: Temporal  SpO2: 99%  Weight: 137 lb 6.4 oz (62.3 kg)  Height: 5' 5.5" (1.664 m)   Body mass index is 22.52 kg/m.   General: Well developed, well nourished. No acute distress. Lungs: Clear to auscultation bilaterally. No wheezing, rales or rhonchi. Extremities: 1+ edema of the right foot and lower leg. No edema on the left. Psych: Alert and oriented. Normal mood and affect.  Health Maintenance Due  Topic Date Due   Zoster Vaccines- Shingrix (1 of 2) Never done   COVID-19 Vaccine (4 - Booster) 09/22/2020   Assessment & Plan:   1. Acute bronchitis, unspecified organism Resolved.  2. Mild persistent asthma with exacerbation Improved. Back at baseline. She can go back to intermittent use of her albuterol as needed.  3. Chronic systolic heart failure ([place]) Although her weight is up 10 lb. from her last visit, this appears to be more of her baseline weight. The edema in her foot is not unusual for her. The CHF appears compensated. She should continue her carvedilol and Lasix.  4. Hypertensive kidney disease with CKD  (chronic kidney disease) stage V (HCC) Amlodipine added by nephrology. We discussed a goal systolic of ~ 409, if tolerated.  Haydee Salter, MD

## 2021-02-06 DIAGNOSIS — M25552 Pain in left hip: Secondary | ICD-10-CM | POA: Diagnosis not present

## 2021-02-23 ENCOUNTER — Other Ambulatory Visit: Payer: Self-pay | Admitting: Orthopedic Surgery

## 2021-02-23 DIAGNOSIS — M5416 Radiculopathy, lumbar region: Secondary | ICD-10-CM

## 2021-02-26 DIAGNOSIS — I4891 Unspecified atrial fibrillation: Secondary | ICD-10-CM | POA: Diagnosis not present

## 2021-02-26 DIAGNOSIS — Z45018 Encounter for adjustment and management of other part of cardiac pacemaker: Secondary | ICD-10-CM | POA: Diagnosis not present

## 2021-02-27 ENCOUNTER — Telehealth: Payer: Self-pay

## 2021-02-27 DIAGNOSIS — Z462 Encounter for fitting and adjustment of other devices related to nervous system and special senses: Secondary | ICD-10-CM | POA: Diagnosis not present

## 2021-02-27 DIAGNOSIS — I4891 Unspecified atrial fibrillation: Secondary | ICD-10-CM | POA: Diagnosis not present

## 2021-02-27 NOTE — Progress Notes (Signed)
Left Voice Message, Per Clinical Pharmacist , Patient is wanting to either reschedule or push her telephone appointment on 02/27/2021 for CCM at 11:00 am back.Left Voice message for patient to return my call so I can reschedule patient appointment.  [place] Pharmacist Assistant [phone] '

## 2021-02-28 ENCOUNTER — Telehealth: Payer: Medicare Other

## 2021-02-28 ENCOUNTER — Ambulatory Visit
Admission: RE | Admit: 2021-02-28 | Discharge: 2021-02-28 | Disposition: A | Discharge status: Home / Self Care | Payer: Medicare Other | Source: Ambulatory Visit | Attending: Orthopedic Surgery | Admitting: Orthopedic Surgery

## 2021-02-28 ENCOUNTER — Other Ambulatory Visit: Payer: Self-pay

## 2021-02-28 DIAGNOSIS — M5126 Other intervertebral disc displacement, lumbar region: Secondary | ICD-10-CM | POA: Diagnosis not present

## 2021-02-28 DIAGNOSIS — M48061 Spinal stenosis, lumbar region without neurogenic claudication: Secondary | ICD-10-CM | POA: Diagnosis not present

## 2021-02-28 DIAGNOSIS — M5416 Radiculopathy, lumbar region: Secondary | ICD-10-CM

## 2021-02-28 IMAGING — CT CT L SPINE W/ CM
1 of 7 series · 6 of 14 positions shown, 8 images · IV contrast (omnipaque)
Comparison: CT [DATE] and [DATE]

CLINICAL DATA: Low back and left lower extremity pain. No previous
lumbar surgery. Renal transplant.

EXAM:
LUMBAR MYELOGRAM
CT LUMBAR SPINE WITH INTRATHECAL CONTRAST
FLUOROSCOPY TIME:  58 seconds; 239  [XJ] DAP
TECHNIQUE: The procedure, risks (including but not limited to bleeding,
infection, organ damage ), benefits, and alternatives were explained
to the patient. Questions regarding the procedure were encouraged
and answered. The patient understands and consents to the procedure.
An appropriate entry site was determined under fluoroscopy. Operator
donned sterile gloves and mask. Skin site was marked, prepped with
Betadine, and draped in usual sterile fashion, and infiltrated
locally with 1% lidocaine. A 22 gauge spinal needle was advanced
into the thecal sac at L3-4 from a right parasagittal approach.
Clear colorless CSF returned. 17 ml Omnipaque 180 were administered
intrathecally for lumbar myelography, followed by axial CT scanning
of the lumbar spine. I personally performed the lumbar puncture and
administered the intrathecal contrast. I also personally supervised
acquisition of the myelogram images. Coronal and sagittal
reconstructions were generated from the axial scan.

[Series 2: l spine soft · axial · 0.27mm/px · z∈[-491,-329]mm · 6 of 115 slices shown, 8 images]
[im 17/115  soft-tissue]
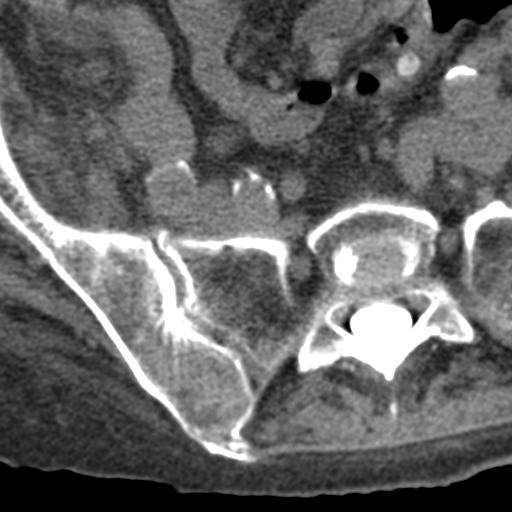
[im 17/115  bone]
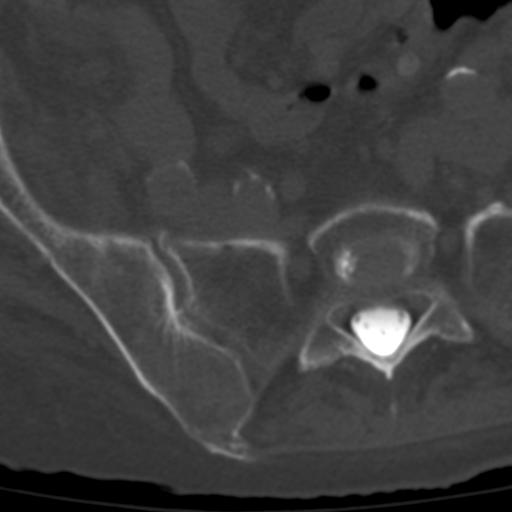
[im 33/115  bone]
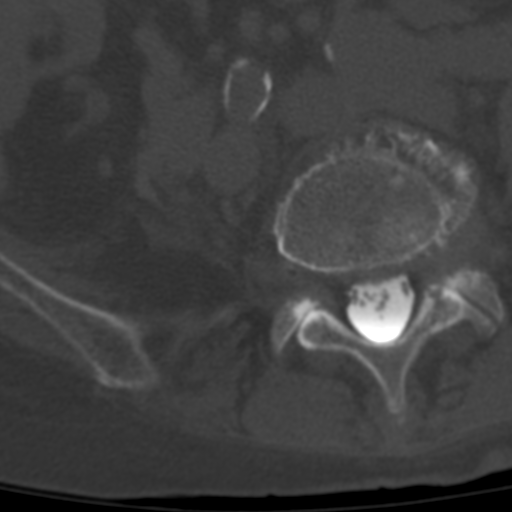
[im 49/115  bone]
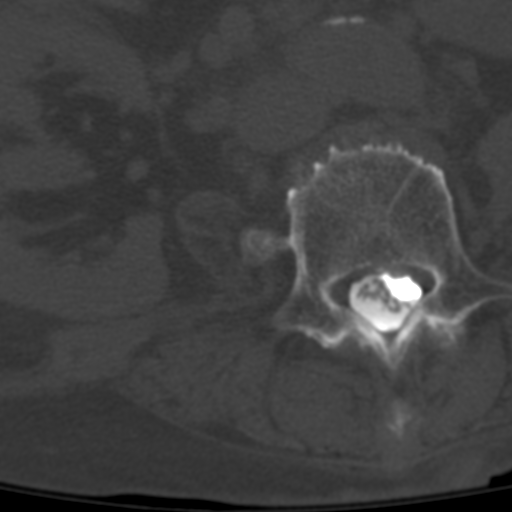
[im 66/115  bone]
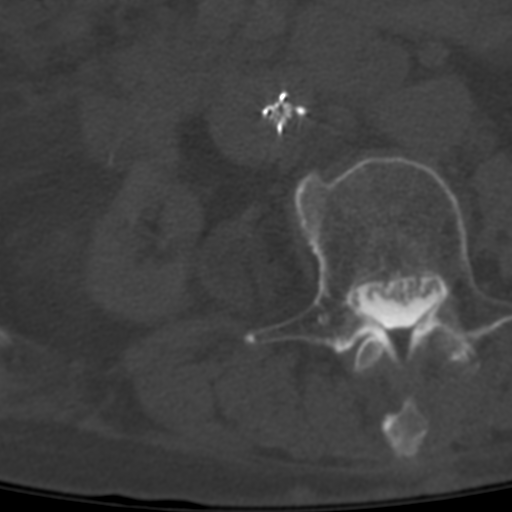
[im 82/115  soft-tissue]
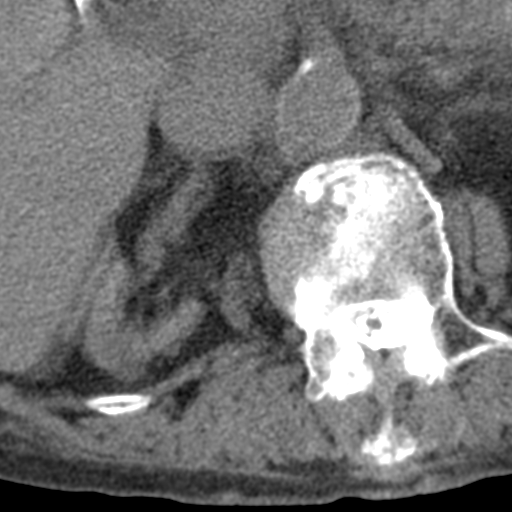
[im 82/115  bone]
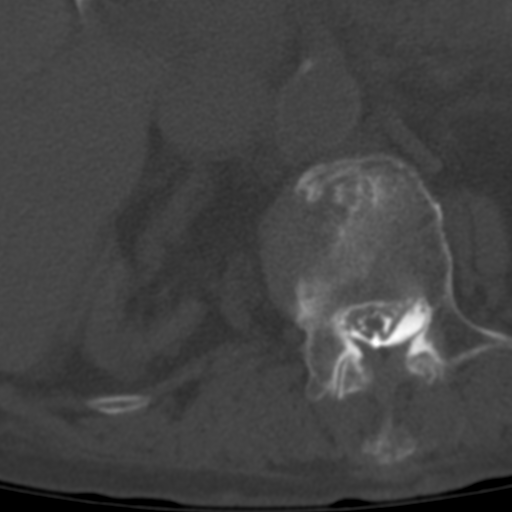
[im 98/115  bone]
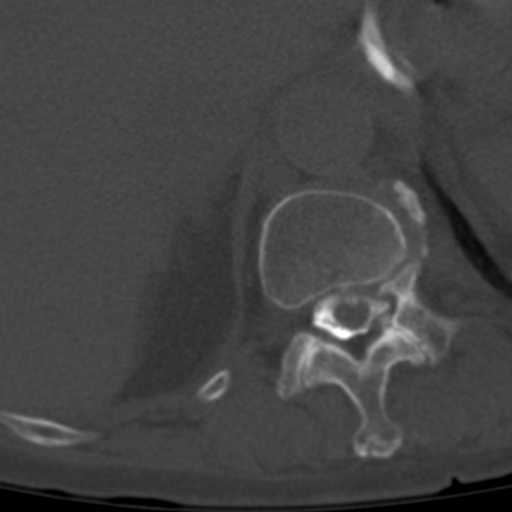

[6 of 14 positions shown; findings below may reference images not displayed]

FINDINGS: 5 non rib-bearing lumbar segments assigned L1-L5. Progressive
compression fracture deformity of L1 since [DATE] with greater
than 75% loss of height anteriorly, approximately 4 mm retropulsion
into the spinal canal. Chronic L2 compression fracture deformity
with greater than 50% loss of height anteriorly, minimal
retropulsion. Mild lumbar levoscoliosis apex L3.

T12-L1: Spinal stenosis secondary to retropulsion from the L1
compression fracture deformity, as well as thickening of the
ligamentum flavum. No definite cord distortion or flattening.
Foramina patent.

L1-2: Moderate left paracentral protrusion with mild flattening of
the anterior aspect of the cord. Facet DJD results in subarticular
recess encroachment, left worse than right. Mild left foraminal
encroachment. Conus terminates behind L2.

L2-3: Asymmetric narrowing of the interspace, right worse than left.
Broad posterior disc bulge, right worse than left, with narrowing of
the right lateral recess, early spinal stenosis, and right foraminal
encroachment.

L3-4: Circumferential disc bulge. Bilateral facet DJD contributes to
bilateral lateral recess encroachment, right worse than left, and
mild spinal stenosis. Foramina patent.

L4-5: Marked narrowing of the interspace with discogenic sclerosis
in the adjacent vertebral bodies and multiple small Schmorl's nodes.
Circumferential large disc bulge results in severe bilateral
foraminal stenosis. There is mild facet DJD. No spinal stenosis.

L5-S1: Narrowing of the interspace with extensive vacuum phenomenon.
Disc bulge with large central protrusion/extrusion extending
inferiorly behind the S1 segment, contacting the bilateral S1 nerve
roots. Left foraminal stenosis. No spinal stenosis.

Scattered aortoiliac atheromatous calcified plaque. Retrievable IVC
filter with apex tilted towards medial wall of the IVC, caval wall
perforation by multiple posterior struts. Scattered sigmoid
diverticula. Cholecystectomy clips.
IMPRESSION: 1. Severe L1 compression fracture deformity, with progressive loss
of height since [DATE].
[DATE]. Mild multifactorial spinal stenosis T12-L1 without compressive
pathology.
3. Left paracentral protrusion L1-2 with anterior cord flattening.
4. Mild multifactorial spinal stenosis L2-3 with right lateral
recess and foraminal narrowing
5. Mild multifactorial spinal stenosis L3-4 with lateral recess
narrowing, right worse than left.
6. Severe bilateral foraminal stenosis L4-5 secondary to large disc
bulge.
7. Disc bulge and large central protrusion L5-S1 with left foraminal
stenosis, may affect bilateral S1 nerve roots.

## 2021-02-28 IMAGING — CR DG MYELOGRAPHY LUMBAR INJ LUMBOSACRAL
13 of 16 series · 13 of 16 positions shown · IV contrast (omnipaque)
Comparison: CT [DATE] and [DATE]

CLINICAL DATA: Low back and left lower extremity pain. No previous
lumbar surgery. Renal transplant.

EXAM:
LUMBAR MYELOGRAM
CT LUMBAR SPINE WITH INTRATHECAL CONTRAST
FLUOROSCOPY TIME:  58 seconds; 239  [XJ] DAP
TECHNIQUE: The procedure, risks (including but not limited to bleeding,
infection, organ damage ), benefits, and alternatives were explained
to the patient. Questions regarding the procedure were encouraged
and answered. The patient understands and consents to the procedure.
An appropriate entry site was determined under fluoroscopy. Operator
donned sterile gloves and mask. Skin site was marked, prepped with
Betadine, and draped in usual sterile fashion, and infiltrated
locally with 1% lidocaine. A 22 gauge spinal needle was advanced
into the thecal sac at L3-4 from a right parasagittal approach.
Clear colorless CSF returned. 17 ml Omnipaque 180 were administered
intrathecally for lumbar myelography, followed by axial CT scanning
of the lumbar spine. I personally performed the lumbar puncture and
administered the intrathecal contrast. I also personally supervised
acquisition of the myelogram images. Coronal and sagittal
reconstructions were generated from the axial scan.

[w lumbar spine lat]
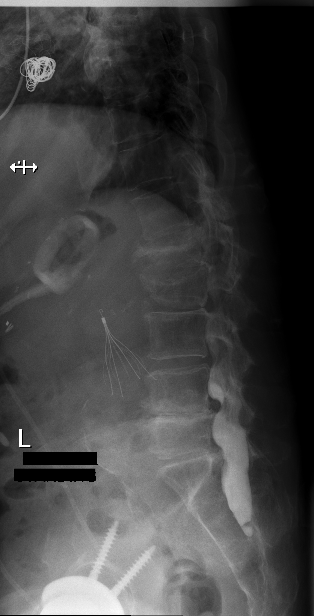

[vasc standard (1 of 11)]
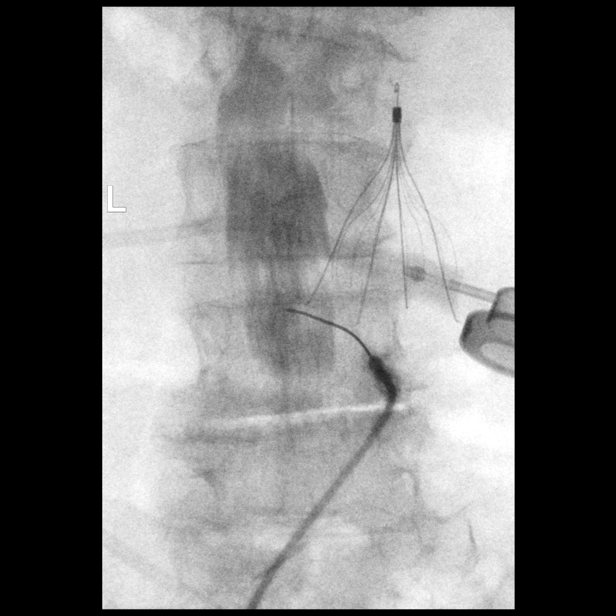

[vasc standard (2 of 11)]
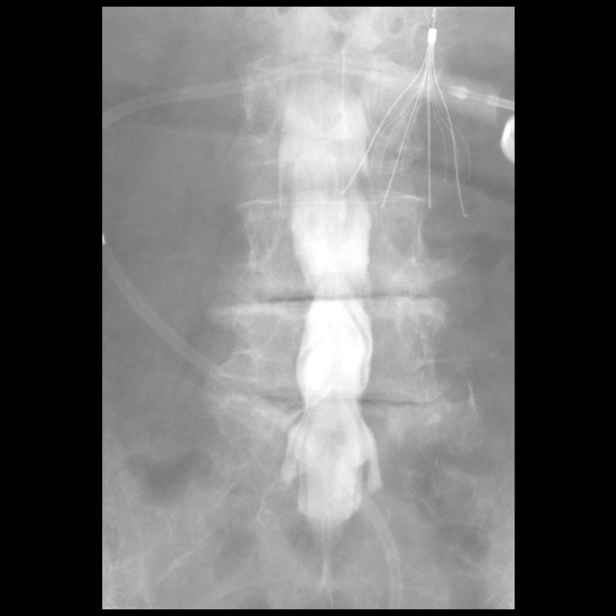

[vasc standard (3 of 11)]
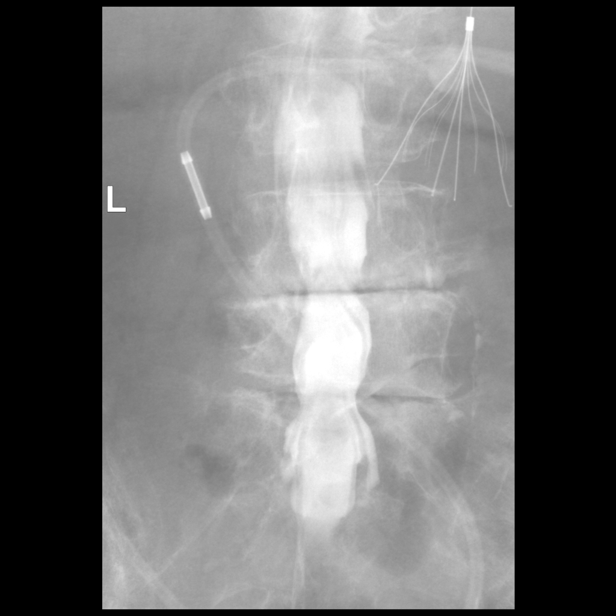

[w lumbar spine extension]
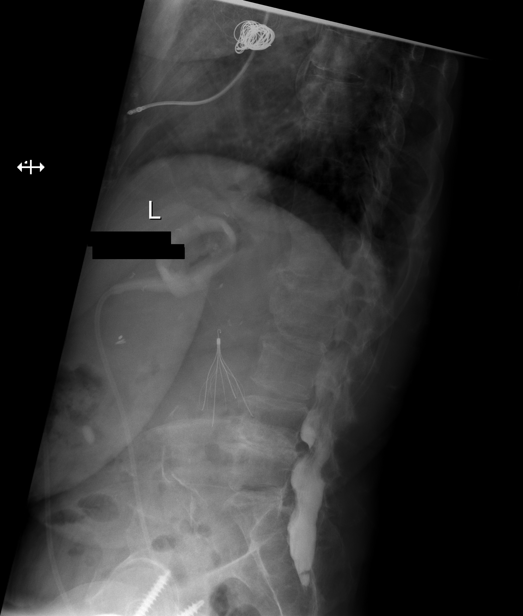

[vasc standard (4 of 11)]
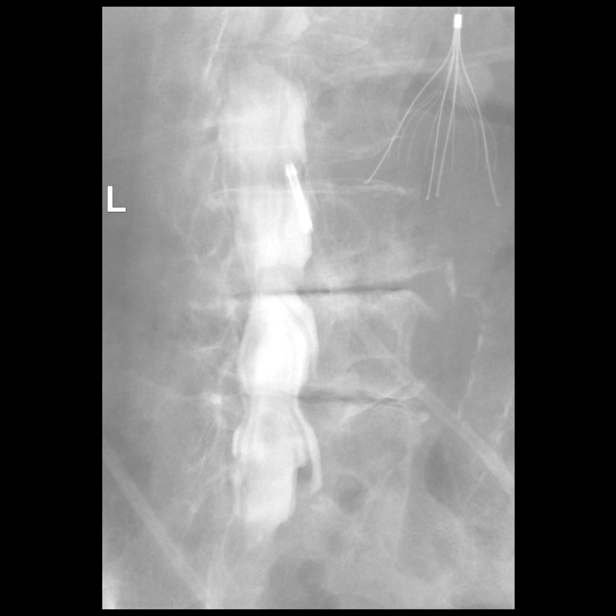

[vasc standard (5 of 11)]
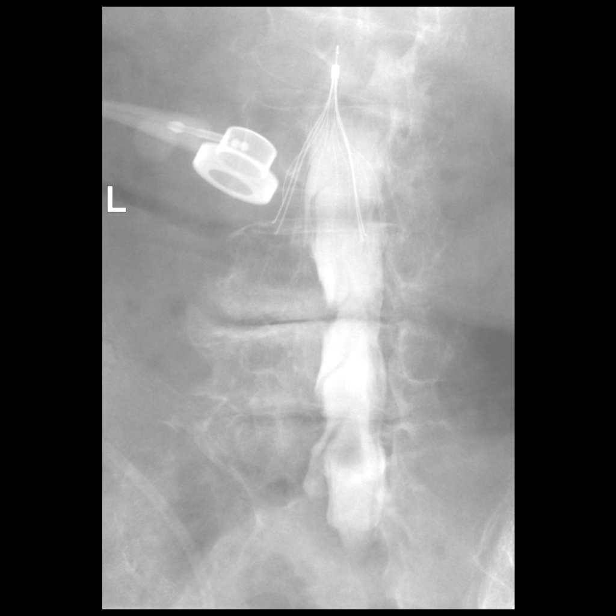

[vasc standard (6 of 11)]
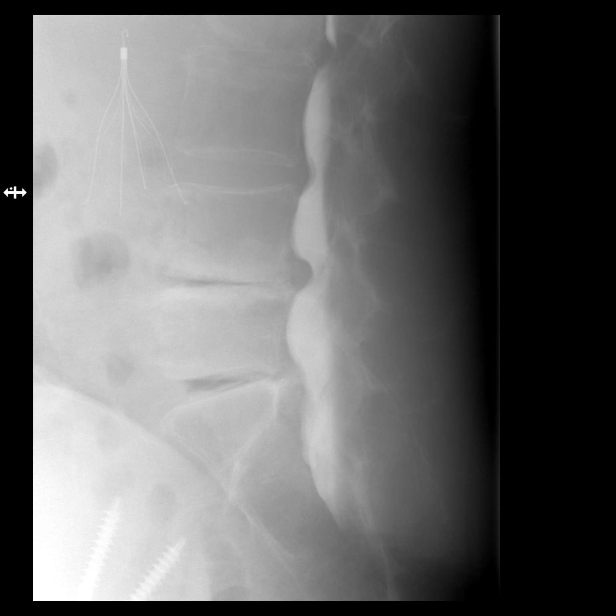

[vasc standard (7 of 11)]
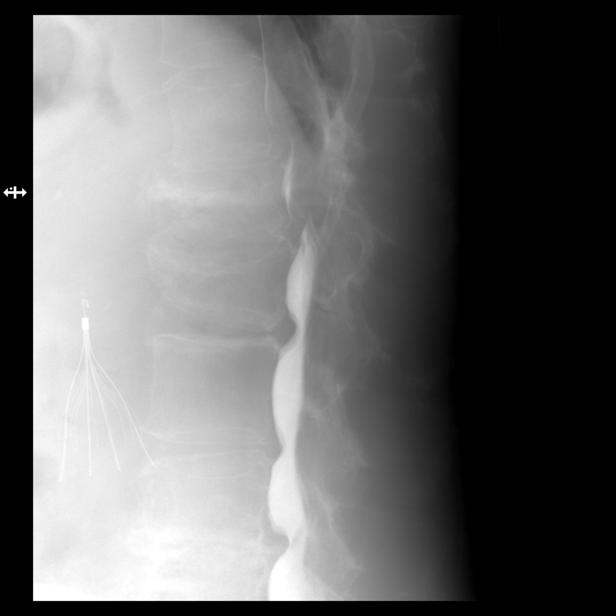

[vasc standard (8 of 11)]
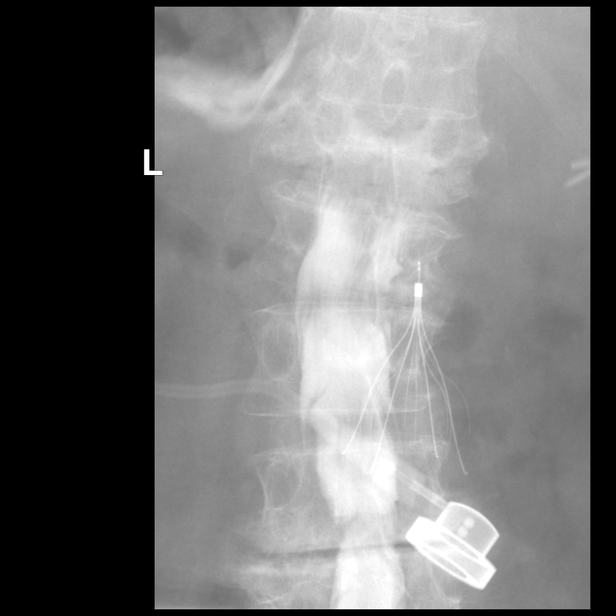

[vasc standard (9 of 11)]
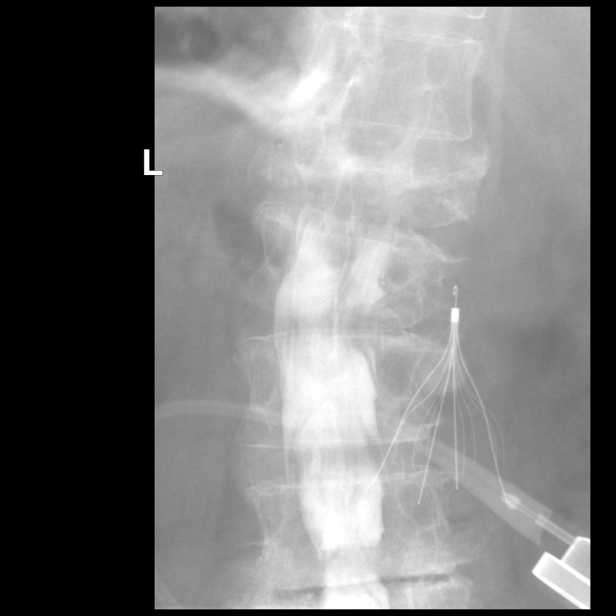

[vasc standard (10 of 11)]
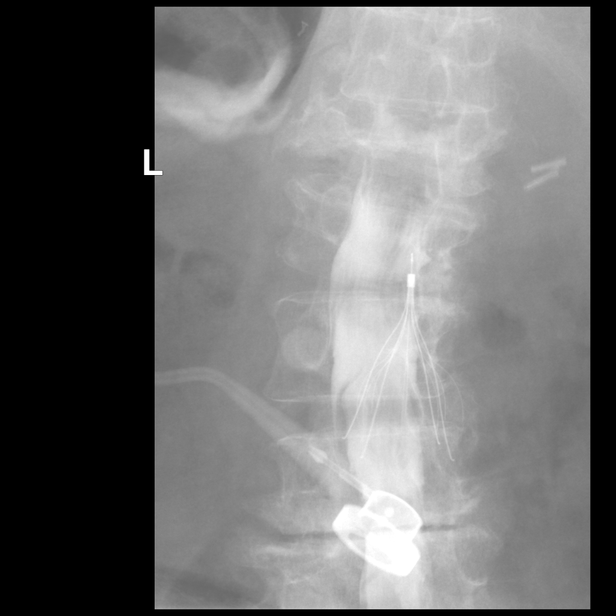

[vasc standard (11 of 11)]
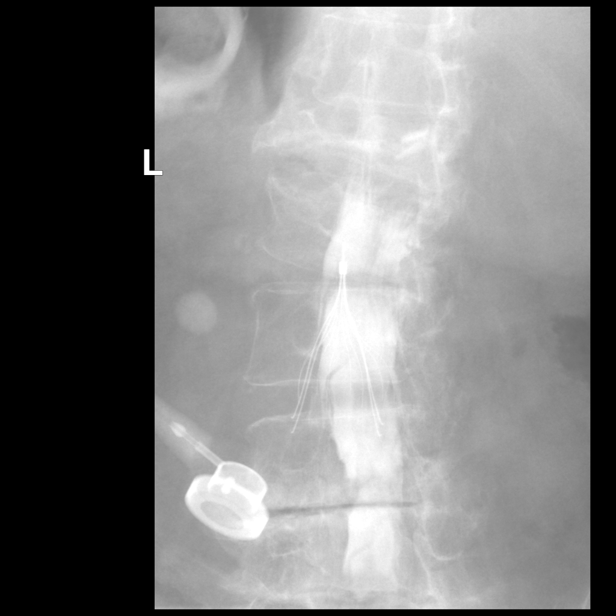

[13 of 16 positions shown; findings below may reference images not displayed]

FINDINGS: 5 non rib-bearing lumbar segments assigned L1-L5. Progressive
compression fracture deformity of L1 since [DATE] with greater
than 75% loss of height anteriorly, approximately 4 mm retropulsion
into the spinal canal. Chronic L2 compression fracture deformity
with greater than 50% loss of height anteriorly, minimal
retropulsion. Mild lumbar levoscoliosis apex L3.

T12-L1: Spinal stenosis secondary to retropulsion from the L1
compression fracture deformity, as well as thickening of the
ligamentum flavum. No definite cord distortion or flattening.
Foramina patent.

L1-2: Moderate left paracentral protrusion with mild flattening of
the anterior aspect of the cord. Facet DJD results in subarticular
recess encroachment, left worse than right. Mild left foraminal
encroachment. Conus terminates behind L2.

L2-3: Asymmetric narrowing of the interspace, right worse than left.
Broad posterior disc bulge, right worse than left, with narrowing of
the right lateral recess, early spinal stenosis, and right foraminal
encroachment.

L3-4: Circumferential disc bulge. Bilateral facet DJD contributes to
bilateral lateral recess encroachment, right worse than left, and
mild spinal stenosis. Foramina patent.

L4-5: Marked narrowing of the interspace with discogenic sclerosis
in the adjacent vertebral bodies and multiple small Schmorl's nodes.
Circumferential large disc bulge results in severe bilateral
foraminal stenosis. There is mild facet DJD. No spinal stenosis.

L5-S1: Narrowing of the interspace with extensive vacuum phenomenon.
Disc bulge with large central protrusion/extrusion extending
inferiorly behind the S1 segment, contacting the bilateral S1 nerve
roots. Left foraminal stenosis. No spinal stenosis.

Scattered aortoiliac atheromatous calcified plaque. Retrievable IVC
filter with apex tilted towards medial wall of the IVC, caval wall
perforation by multiple posterior struts. Scattered sigmoid
diverticula. Cholecystectomy clips.
IMPRESSION: 1. Severe L1 compression fracture deformity, with progressive loss
of height since [DATE].
[DATE]. Mild multifactorial spinal stenosis T12-L1 without compressive
pathology.
3. Left paracentral protrusion L1-2 with anterior cord flattening.
4. Mild multifactorial spinal stenosis L2-3 with right lateral
recess and foraminal narrowing
5. Mild multifactorial spinal stenosis L3-4 with lateral recess
narrowing, right worse than left.
6. Severe bilateral foraminal stenosis L4-5 secondary to large disc
bulge.
7. Disc bulge and large central protrusion L5-S1 with left foraminal
stenosis, may affect bilateral S1 nerve roots.

## 2021-02-28 MED ORDER — IOPAMIDOL (ISOVUE-M 200) INJECTION 41%
15.0000 mL | Freq: Once | INTRAMUSCULAR | Status: AC
  Administered 2021-02-28: 15 mL via INTRATHECAL

## 2021-02-28 MED ORDER — DIAZEPAM 5 MG PO TABS
5.0000 mg | ORAL_TABLET | Freq: Once | ORAL | Status: AC
  Administered 2021-02-28: 5 mg via ORAL

## 2021-02-28 NOTE — Discharge Instructions (Signed)

## 2021-03-10 DIAGNOSIS — M5416 Radiculopathy, lumbar region: Secondary | ICD-10-CM | POA: Diagnosis not present

## 2021-03-10 DIAGNOSIS — M545 Low back pain, unspecified: Secondary | ICD-10-CM | POA: Diagnosis not present

## 2021-03-10 DIAGNOSIS — M5126 Other intervertebral disc displacement, lumbar region: Secondary | ICD-10-CM | POA: Diagnosis not present

## 2021-03-17 DIAGNOSIS — M5416 Radiculopathy, lumbar region: Secondary | ICD-10-CM | POA: Diagnosis not present

## 2021-03-18 ENCOUNTER — Inpatient Hospital Stay (HOSPITAL_COMMUNITY)
Admission: EM | Admit: 2021-03-18 | Discharge: 2021-03-25 | DRG: 377 | Disposition: A | Discharge status: Home Health | Payer: Medicare Other | Attending: Family Medicine | Admitting: Family Medicine

## 2021-03-18 DIAGNOSIS — K219 Gastro-esophageal reflux disease without esophagitis: Secondary | ICD-10-CM | POA: Diagnosis present

## 2021-03-18 DIAGNOSIS — Z7982 Long term (current) use of aspirin: Secondary | ICD-10-CM

## 2021-03-18 DIAGNOSIS — I5032 Chronic diastolic (congestive) heart failure: Secondary | ICD-10-CM | POA: Diagnosis present

## 2021-03-18 DIAGNOSIS — I10 Essential (primary) hypertension: Secondary | ICD-10-CM | POA: Diagnosis not present

## 2021-03-18 DIAGNOSIS — Z833 Family history of diabetes mellitus: Secondary | ICD-10-CM | POA: Diagnosis not present

## 2021-03-18 DIAGNOSIS — Z96641 Presence of right artificial hip joint: Secondary | ICD-10-CM | POA: Diagnosis present

## 2021-03-18 DIAGNOSIS — D849 Immunodeficiency, unspecified: Secondary | ICD-10-CM | POA: Diagnosis present

## 2021-03-18 DIAGNOSIS — Z950 Presence of cardiac pacemaker: Secondary | ICD-10-CM

## 2021-03-18 DIAGNOSIS — D62 Acute posthemorrhagic anemia: Secondary | ICD-10-CM | POA: Diagnosis present

## 2021-03-18 DIAGNOSIS — N184 Chronic kidney disease, stage 4 (severe): Secondary | ICD-10-CM | POA: Diagnosis present

## 2021-03-18 DIAGNOSIS — Z86718 Personal history of other venous thrombosis and embolism: Secondary | ICD-10-CM | POA: Diagnosis not present

## 2021-03-18 DIAGNOSIS — N189 Chronic kidney disease, unspecified: Secondary | ICD-10-CM | POA: Diagnosis present

## 2021-03-18 DIAGNOSIS — E7800 Pure hypercholesterolemia, unspecified: Secondary | ICD-10-CM | POA: Diagnosis present

## 2021-03-18 DIAGNOSIS — Z8673 Personal history of transient ischemic attack (TIA), and cerebral infarction without residual deficits: Secondary | ICD-10-CM

## 2021-03-18 DIAGNOSIS — I739 Peripheral vascular disease, unspecified: Secondary | ICD-10-CM | POA: Diagnosis present

## 2021-03-18 DIAGNOSIS — Z7951 Long term (current) use of inhaled steroids: Secondary | ICD-10-CM

## 2021-03-18 DIAGNOSIS — J45909 Unspecified asthma, uncomplicated: Secondary | ICD-10-CM | POA: Diagnosis present

## 2021-03-18 DIAGNOSIS — E869 Volume depletion, unspecified: Secondary | ICD-10-CM | POA: Diagnosis present

## 2021-03-18 DIAGNOSIS — Z9989 Dependence on other enabling machines and devices: Secondary | ICD-10-CM | POA: Diagnosis not present

## 2021-03-18 DIAGNOSIS — F32A Depression, unspecified: Secondary | ICD-10-CM | POA: Diagnosis present

## 2021-03-18 DIAGNOSIS — K921 Melena: Secondary | ICD-10-CM | POA: Diagnosis not present

## 2021-03-18 DIAGNOSIS — Z743 Need for continuous supervision: Secondary | ICD-10-CM | POA: Diagnosis not present

## 2021-03-18 DIAGNOSIS — Z8249 Family history of ischemic heart disease and other diseases of the circulatory system: Secondary | ICD-10-CM | POA: Diagnosis not present

## 2021-03-18 DIAGNOSIS — Z95818 Presence of other cardiac implants and grafts: Secondary | ICD-10-CM

## 2021-03-18 DIAGNOSIS — Z8616 Personal history of COVID-19: Secondary | ICD-10-CM | POA: Diagnosis not present

## 2021-03-18 DIAGNOSIS — N179 Acute kidney failure, unspecified: Secondary | ICD-10-CM | POA: Diagnosis not present

## 2021-03-18 DIAGNOSIS — T380X5A Adverse effect of glucocorticoids and synthetic analogues, initial encounter: Secondary | ICD-10-CM | POA: Diagnosis present

## 2021-03-18 DIAGNOSIS — K625 Hemorrhage of anus and rectum: Secondary | ICD-10-CM | POA: Diagnosis not present

## 2021-03-18 DIAGNOSIS — I499 Cardiac arrhythmia, unspecified: Secondary | ICD-10-CM | POA: Diagnosis not present

## 2021-03-18 DIAGNOSIS — K922 Gastrointestinal hemorrhage, unspecified: Secondary | ICD-10-CM | POA: Diagnosis not present

## 2021-03-18 DIAGNOSIS — K5900 Constipation, unspecified: Secondary | ICD-10-CM | POA: Diagnosis present

## 2021-03-18 DIAGNOSIS — R6889 Other general symptoms and signs: Secondary | ICD-10-CM | POA: Diagnosis not present

## 2021-03-18 DIAGNOSIS — E875 Hyperkalemia: Secondary | ICD-10-CM | POA: Diagnosis present

## 2021-03-18 DIAGNOSIS — I4821 Permanent atrial fibrillation: Secondary | ICD-10-CM | POA: Diagnosis present

## 2021-03-18 DIAGNOSIS — Z7952 Long term (current) use of systemic steroids: Secondary | ICD-10-CM

## 2021-03-18 DIAGNOSIS — Z79899 Other long term (current) drug therapy: Secondary | ICD-10-CM

## 2021-03-18 DIAGNOSIS — Y830 Surgical operation with transplant of whole organ as the cause of abnormal reaction of the patient, or of later complication, without mention of misadventure at the time of the procedure: Secondary | ICD-10-CM | POA: Diagnosis present

## 2021-03-18 DIAGNOSIS — T8619 Other complication of kidney transplant: Secondary | ICD-10-CM | POA: Diagnosis present

## 2021-03-18 DIAGNOSIS — N170 Acute kidney failure with tubular necrosis: Secondary | ICD-10-CM | POA: Diagnosis present

## 2021-03-18 DIAGNOSIS — D72828 Other elevated white blood cell count: Secondary | ICD-10-CM | POA: Diagnosis present

## 2021-03-18 DIAGNOSIS — G4733 Obstructive sleep apnea (adult) (pediatric): Secondary | ICD-10-CM | POA: Diagnosis present

## 2021-03-18 DIAGNOSIS — Z86711 Personal history of pulmonary embolism: Secondary | ICD-10-CM

## 2021-03-18 DIAGNOSIS — Z940 Kidney transplant status: Secondary | ICD-10-CM | POA: Diagnosis not present

## 2021-03-18 DIAGNOSIS — K264 Chronic or unspecified duodenal ulcer with hemorrhage: Secondary | ICD-10-CM | POA: Diagnosis not present

## 2021-03-18 DIAGNOSIS — I420 Dilated cardiomyopathy: Secondary | ICD-10-CM | POA: Diagnosis present

## 2021-03-18 DIAGNOSIS — K5730 Diverticulosis of large intestine without perforation or abscess without bleeding: Secondary | ICD-10-CM | POA: Diagnosis not present

## 2021-03-18 DIAGNOSIS — I130 Hypertensive heart and chronic kidney disease with heart failure and stage 1 through stage 4 chronic kidney disease, or unspecified chronic kidney disease: Secondary | ICD-10-CM | POA: Diagnosis present

## 2021-03-18 DIAGNOSIS — I4819 Other persistent atrial fibrillation: Secondary | ICD-10-CM | POA: Diagnosis present

## 2021-03-18 DIAGNOSIS — R404 Transient alteration of awareness: Secondary | ICD-10-CM | POA: Diagnosis not present

## 2021-03-18 DIAGNOSIS — R58 Hemorrhage, not elsewhere classified: Secondary | ICD-10-CM | POA: Diagnosis not present

## 2021-03-18 DIAGNOSIS — Z8719 Personal history of other diseases of the digestive system: Secondary | ICD-10-CM | POA: Diagnosis not present

## 2021-03-18 DIAGNOSIS — F419 Anxiety disorder, unspecified: Secondary | ICD-10-CM | POA: Diagnosis present

## 2021-03-18 DIAGNOSIS — D509 Iron deficiency anemia, unspecified: Secondary | ICD-10-CM | POA: Diagnosis not present

## 2021-03-18 DIAGNOSIS — K269 Duodenal ulcer, unspecified as acute or chronic, without hemorrhage or perforation: Secondary | ICD-10-CM | POA: Diagnosis not present

## 2021-03-18 DIAGNOSIS — Z807 Family history of other malignant neoplasms of lymphoid, hematopoietic and related tissues: Secondary | ICD-10-CM | POA: Diagnosis not present

## 2021-03-18 DIAGNOSIS — D649 Anemia, unspecified: Secondary | ICD-10-CM | POA: Diagnosis present

## 2021-03-18 LAB — COMPREHENSIVE METABOLIC PANEL
ALT: 10 U/L (ref 0–44)
AST: 10 U/L — ABNORMAL LOW (ref 15–41)
Albumin: 2.4 g/dL — ABNORMAL LOW (ref 3.5–5.0)
Alkaline Phosphatase: 87 U/L (ref 38–126)
Anion gap: 10 (ref 5–15)
BUN: 111 mg/dL — ABNORMAL HIGH (ref 8–23)
CO2: 23 mmol/L (ref 22–32)
Calcium: 8.6 mg/dL — ABNORMAL LOW (ref 8.9–10.3)
Chloride: 100 mmol/L (ref 98–111)
Creatinine, Ser: 4.97 mg/dL — ABNORMAL HIGH (ref 0.44–1.00)
GFR, Estimated: 9 mL/min — ABNORMAL LOW (ref >60)
Glucose, Bld: 143 mg/dL — ABNORMAL HIGH (ref 70–99)
Potassium: 5.4 mmol/L — ABNORMAL HIGH (ref 3.5–5.1)
Sodium: 133 mmol/L — ABNORMAL LOW (ref 135–145)
Total Bilirubin: 0.5 mg/dL (ref 0.3–1.2)
Total Protein: 5.3 g/dL — ABNORMAL LOW (ref 6.5–8.1)

## 2021-03-18 LAB — CBC WITH DIFFERENTIAL/PLATELET
Abs Immature Granulocytes: 0.97 10*3/uL — ABNORMAL HIGH (ref 0.00–0.07)
Basophils Absolute: 0.1 10*3/uL (ref 0.0–0.1)
Basophils Relative: 0 %
Eosinophils Absolute: 0 10*3/uL (ref 0.0–0.5)
Eosinophils Relative: 0 %
HCT: 22.6 % — ABNORMAL LOW (ref 36.0–46.0)
Hemoglobin: 7.1 g/dL — ABNORMAL LOW (ref 12.0–15.0)
Immature Granulocytes: 4 %
Lymphocytes Relative: 4 %
Lymphs Abs: 1.1 10*3/uL (ref 0.7–4.0)
MCH: 26.7 pg (ref 26.0–34.0)
MCHC: 31.4 g/dL (ref 30.0–36.0)
MCV: 85 fL (ref 80.0–100.0)
Monocytes Absolute: 1.2 10*3/uL — ABNORMAL HIGH (ref 0.1–1.0)
Monocytes Relative: 5 %
Neutro Abs: 21.7 10*3/uL — ABNORMAL HIGH (ref 1.7–7.7)
Neutrophils Relative %: 87 %
Platelets: 511 10*3/uL — ABNORMAL HIGH (ref 150–400)
RBC: 2.66 MIL/uL — ABNORMAL LOW (ref 3.87–5.11)
RDW: 16.9 % — ABNORMAL HIGH (ref 11.5–15.5)
WBC: 24.9 10*3/uL — ABNORMAL HIGH (ref 4.0–10.5)
nRBC: 0 % (ref 0.0–0.2)

## 2021-03-18 LAB — I-STAT CHEM 8, ED
BUN: 112 mg/dL — ABNORMAL HIGH (ref 8–23)
Calcium, Ion: 1.16 mmol/L (ref 1.15–1.40)
Chloride: 103 mmol/L (ref 98–111)
Creatinine, Ser: 5.1 mg/dL — ABNORMAL HIGH (ref 0.44–1.00)
Glucose, Bld: 128 mg/dL — ABNORMAL HIGH (ref 70–99)
HCT: 24 % — ABNORMAL LOW (ref 36.0–46.0)
Hemoglobin: 8.2 g/dL — ABNORMAL LOW (ref 12.0–15.0)
Potassium: 5.4 mmol/L — ABNORMAL HIGH (ref 3.5–5.1)
Sodium: 134 mmol/L — ABNORMAL LOW (ref 135–145)
TCO2: 25 mmol/L (ref 22–32)

## 2021-03-18 LAB — PROTIME-INR
INR: 1.2 (ref 0.8–1.2)
Prothrombin Time: 15 seconds (ref 11.4–15.2)

## 2021-03-18 LAB — PREPARE RBC (CROSSMATCH)

## 2021-03-18 MED ORDER — PANTOPRAZOLE 80MG IVPB - SIMPLE MED
80.0000 mg | Freq: Once | INTRAVENOUS | Status: AC
  Administered 2021-03-18: 80 mg via INTRAVENOUS
  Filled 2021-03-18: qty 80

## 2021-03-18 MED ORDER — SODIUM ZIRCONIUM CYCLOSILICATE 10 G PO PACK
10.0000 g | PACK | Freq: Once | ORAL | Status: AC
  Administered 2021-03-19: 10 g via ORAL
  Filled 2021-03-18: qty 1

## 2021-03-18 MED ORDER — SODIUM CHLORIDE 0.9 % IV SOLN: 10.0000 mL/h | Freq: Once | INTRAVENOUS | Status: AC

## 2021-03-18 MED ORDER — PANTOPRAZOLE SODIUM 40 MG IV SOLR
40.0000 mg | Freq: Two times a day (BID) | INTRAVENOUS | Status: DC
  Administered 2021-03-19: 40 mg via INTRAVENOUS
  Filled 2021-03-18: qty 40

## 2021-03-18 MED ORDER — LACTATED RINGERS IV BOLUS
1000.0000 mL | Freq: Once | INTRAVENOUS | Status: AC
  Administered 2021-03-18: 1000 mL via INTRAVENOUS

## 2021-03-18 NOTE — ED Triage Notes (Signed)
Pt bib [facility] from home d/t GI bleed and several syncope episodes throughout the day. Per ems, pt was found in the hallway at home trying to go the bathroom. Pt passed  out with ems abt 3 times as they were trying to sit the pt up. Pt has a pacemaker.  A &O X4    Vitals with ems  -BP 90/50 -98%RA -HR 69

## 2021-03-18 NOTE — ED Notes (Signed)
Attempted report 

## 2021-03-18 NOTE — ED Provider Notes (Signed)
[facility] EMERGENCY DEPARTMENT Provider Note   CSN: [id number] Arrival date & time: 03/18/21  2018     History Chief Complaint  Patient presents with   GI Bleeding   Hypotension    [name] is a 75 y.o. female with PMHx ESRD (status post renal transplant 2017 on chronic immunosuppression), permanent A. fib status post ablation not on anticoagulation, cardiomyopathy, pacemaker, prior PE who presents for evaluation of GI bleeding  Patient reports approximately 2-day history of dark melanotic stools.  She states that she was attempting to go to the restroom today when she experienced a syncopal episode.  Upon awakening, she found that she had had a large volume bloody stool.  EMS was called, the patient was subsequently transported to our emergency department for further evaluation.  She denies any recent nausea, vomiting, hematemesis, hematochezia, chest pain, or shortness of breath.  Of note, patient has a history of hospital admission for GI bleeds in the past, most recently in June 2021.  During that evaluation, she underwent both EGD and colonoscopy, which demonstrated gastric erosions as well as diverticulosis throughout sigmoid colon, with friable colonic mucosa.  Her bleeding was thought to be due to her diverticuli.   Past Medical History:  Diagnosis Date   Acute diastolic (congestive) heart failure ([place]) 08/12/2017   Acute kidney injury superimposed on chronic kidney disease ([place]) 11/17/2017   AKI (acute kidney injury) ([place]) 11/04/2017   Anemia    Anxiety    ARF (acute renal failure) ([place]) 06/06/2011   Arthritis    "shoulders; arms; hips" (02/04/2018)   Asthma    Atrial fibrillation (HCC)    Atrial flutter ([place])    s/p aflutter ablation at [facility]   Bacteriuria, asymptomatic 11/14/2020   Benign hypertension with ESRD (end-stage renal disease) ([place])    Blood transfusion    never had a reaction to blood transfusion   Cardiomyopathy Mar 2013   Mild, EF  50-55% by Mar 2013 ECHO, diast dysfxn II   Cardiomyopathy ([place])    CHF (congestive heart failure) ([place]) 2005   CHF (congestive heart failure) ([place])    Childhood asthma    Closed fracture of right distal radius 10/18/2016   CMV (cytomegalovirus infection) ([place]) 10/03/2015   Constipation    takes Miralax daily   Constipation    Depression    takes Zoloft daily   Distal radius fracture, right    DVT of lower extremity, bilateral ([place]) 12/21/11   "they're there now; been there for 2 wks"   Eczema    End stage renal disease ([place]) 11/06/2011   ESRD (end stage renal disease) on dialysis [facility]) 09/2011   07-19-2015 had Kidney transplant at [facility]; "don't get dialysis anymore" (02/04/2018)   Essential hypertension 07/07/2007   Qualifier: Diagnosis of  By: Garen Grams     Fractures, stress    in both feet--6 OR 7 YRS AGO--RESOLVED   Generalized edema [id number]   Gout    doesn't require meds    HCAP (healthcare-associated pneumonia) 09/10/2011   Hearing difficulty    High cholesterol    History of CVA (cerebrovascular accident) without residual deficits    History of hip replacement, total, right    History of pneumonia    Hx of Clostridium difficile infection    Hypercalcemia    09/10/11   Hyperparathyroidism ([place]) 04/07/2013   Hypertension    takes Diltiazem daily    Hypomagnesemia 08/02/2015   Hypophosphatemia 11/08/2017   ICB (intracranial bleed) (  [place])    Immunosuppression ([place]) 07/25/2015   Memory changes    Morbid obesity ([place])    Nonischemic dilated cardiomyopathy ([place])    Obesity 04/07/2013   Oligouria    OSA on CPAP    PAF (paroxysmal atrial fibrillation) (HCC)     HX OF CEREBRAL BLEED WHILE ON COUMADIN-SO PT NOT ON ANY BLOOD THINNERS NOW   Peripheral vascular disease ([place])    Presence of Watchman left atrial appendage closure device 10/04/2017   Proteinuria 09/04/2016   Psoriasis 08/07/2016   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/25/2015   S/P insertion of IVC (inferior vena  caval) filter    Sepsis ([place]) 11/08/2017   Stress fracture    bilateral feet   Stroke ([place]) 2009   denies residual on 02/04/2018;  hemorrhagic now off coumadin   Stroke due to intracerebral hemorrhage ([place]) 2009   Suture reaction 09/04/2016   Transfusion history    Use of cane as ambulatory aid     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   Gastroesophageal reflux disease 06/29/2020   Iron deficiency anemia 06/29/2020   Hematoma of left lower leg 05/17/2020   SOB (shortness of breath) 03/11/2020   Pneumonia due to COVID-19 virus 03/11/2020   Wound of right leg 01/28/2020   Traumatic seroma of lower leg, right, subsequent encounter 01/28/2020   Other secondary pulmonary hypertension ([place]) 12/03/2019   Acute GI bleeding 11/14/2019   GI bleed 11/14/2019   Medication management 11/04/2019   Acute on chronic combined systolic and diastolic congestive heart failure ([place]) 10/22/2019   S/P AV nodal ablation 10/08/2018   Cardiac pacemaker in situ 04/18/2018   Oral mucositis 02/22/2018   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   Personal history of PE (pulmonary embolism) 21/30/8657   Chronic systolic heart failure ([place]) 02/11/2018   Hyperkalemia 02/03/2018   Lower extremity edema 12/10/2017   UTI (urinary tract infection) 11/03/2017   Diarrhea 11/03/2017   Presence of Watchman left atrial appendage closure device 10/04/2017   Chronic respiratory failure with hypoxia ([place]) 10/03/2017   Persistent atrial fibrillation ([place]) 08/12/2017   Dyspnea 07/26/2017   Atrial flutter ([place])    Peripheral vascular disease ([place])    Oligouria    Hypertension    Fractures, stress    Eczema    Distal radius fracture, right    Depression    Constipation    Arthritis    Exacerbation of asthma 04/01/2017   Closed fracture of right distal radius 10/18/2016   Proteinuria 09/04/2016   Renal transplant  recipient 07/25/2015   Immunosuppression ([place]) 07/25/2015   Nonischemic dilated cardiomyopathy ([place])    S/P insertion of IVC (inferior vena caval) filter 04/07/2013   Use of cane as ambulatory aid 04/07/2013   H/O: stroke 04/07/2013   Expected blood loss anemia 03/26/2012   S/P hip replacement 03/25/2012   DVT of lower extremity, bilateral ([place]) 12/21/2011   Personal history of DVT (deep vein thrombosis) 12/08/2011   Obstructive sleep apnea 12/08/2011   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) 11/25/2008   RENAL FAILURE, ACUTE 11/25/2008   CVA 09/28/2008   CVA (cerebrovascular accident due to intracerebral hemorrhage) ([place]) 09/28/2008   Hemiplegia ([place]) 09/21/2008   Psoriasis 10/20/2007   Stroke due to intracerebral hemorrhage ([place]) 07/17/2007   Stroke ([place]) 07/17/2007   Asthmatic bronchitis without complication 78/29/5621    Past Surgical History:  Procedure Laterality Date   AV FISTULA PLACEMENT  09/28/2011   Procedure: ARTERIOVENOUS (AV) FISTULA CREATION;  Surgeon: Rosetta Posner, MD;  Location: [facility] OR;  Service: Vascular;  Laterality: Left;   [place] DEFECT   CHOLECYSTECTOMY     1980's   COLONOSCOPY     COLONOSCOPY WITH PROPOFOL N/A 11/16/2019   Procedure: COLONOSCOPY WITH PROPOFOL;  Surgeon: Juanita Craver, MD;  Location: [facility] ENDOSCOPY;  Service: Endoscopy;  Laterality: N/A;   CYSTOSCOPY     many yrs ago   ESOPHAGOGASTRODUODENOSCOPY (EGD) WITH PROPOFOL N/A 11/16/2019    Procedure: ESOPHAGOGASTRODUODENOSCOPY (EGD) WITH PROPOFOL;  Surgeon: Juanita Craver, MD;  Location: [facility] ENDOSCOPY;  Service: Endoscopy;  Laterality: N/A;   FEMUR FRACTURE SURGERY Right 03/2011; 09/03/2011   "had 2, 2 wk apart in 2012; broke it again 08/2011 & had OR"   FRACTURE SURGERY     I & D EXTREMITY  09/15/2011   Procedure: IRRIGATION AND DEBRIDEMENT EXTREMITY w REMOVAL OF HARDWARE;  Surgeon: [name], MD;  Location: [facility];  Service: Orthopedics;  Laterality: Right;   INCISION AND DRAINAGE HIP  12/21/2011   Procedure: IRRIGATION AND DEBRIDEMENT HIP;  Surgeon: [name], MD;  Location: [facility];  Service: Orthopedics;  Laterality: Right;  I&D RIGHT HIP WITH PLACEMENT ANTIBIOTIC SPACER   INSERTION OF DIALYSIS CATHETER     Procedure: INSERTION OF DIALYSIS CATHETER;  Surgeon: Rosetta Posner, MD;  Location: [facility];  Service: Vascular;  Laterality: Right;   IRRIGATION AND DEBRIDEMENT ABSCESS  12/21/11   right hip   JOINT REPLACEMENT     KIDNEY TRANSPLANT  07/19/15   LAPAROSCOPIC GASTRIC BANDING  2008   LEFT ATRIAL APPENDAGE OCCLUSION  10/04/2017   OPEN REDUCTION INTERNAL FIXATION (ORIF) DISTAL RADIAL FRACTURE Right 10/18/2016   Procedure: OPEN REDUCTION INTERNAL FIXATION (ORIF) RIGHT DISTAL RADIAL FRACTURE;  Surgeon: Marchia Bond, MD;  Location: [facility];  Service: Orthopedics;  Laterality: Right;   TOTAL HIP ARTHROPLASTY Right 02/2011   right THA 02/2011, I&D/removal of hardware 09/2011,, repeat I&D Jun 2013, reimplantation R THA 03-26-2012   TOTAL HIP REVISION  03/25/2012   Procedure: TOTAL HIP REVISION;  Surgeon: [name], MD;  Location: [facility] ORS;  Service: Orthopedics;  Laterality: Right;  Right Total Hip Reimplantation   TUBAL LIGATION     VENA CAVA FILTER PLACEMENT  11/2011     OB History   No obstetric history on file.     Family History  Problem Relation Age of Onset   Cancer Father    Diabetes Mother    Hypertension Mother    Arthritis Mother    Hodgkin's lymphoma  Other 41       dscd---HODGKINS DISEASE   Hypertension Brother     Social History   Tobacco Use   Smoking status: Never   Smokeless tobacco: Never  Vaping Use   Vaping Use: Never used  Substance Use Topics   Alcohol use: Never   Drug use: Never    Home Medications Prior to  Admission medications   Medication Sig Start Date End Date Taking? Authorizing Provider  acetaminophen (TYLENOL) 500 MG tablet Take 500 mg by mouth every 6 (six) hours as needed for mild pain or headache.    Yes [provider]  albuterol (PROVENTIL) (2.5 MG/3ML) 0.083% nebulizer solution Take 3 mLs (2.5 mg total) by nebulization every 6 (six) hours as needed for wheezing or shortness of breath. Dx:J45.909 10/25/19  Yes Rai, Ripudeep K, MD  amLODipine (NORVASC) 2.5 MG tablet Take 2.5 mg by mouth daily. 01/25/21  Yes [provider]  aspirin EC 81 MG tablet Take 81 mg by mouth daily. Swallow whole.   Yes [provider]  budesonide-formoterol (SYMBICORT) 160-4.5 MCG/ACT inhaler Inhale 2 puffs into the lungs 2 (two) times daily.   Yes [provider]  calcitRIOL (ROCALTROL) 0.25 MCG capsule Take 0.25 mcg by mouth daily. 12/13/19  Yes [provider]  carvedilol (COREG) 12.5 MG tablet Take 2 tablets (25 mg total) by mouth 2 (two) times daily. 11/18/19  Yes Aline August, MD  docusate sodium (COLACE) 100 MG capsule Take 100 mg by mouth daily.   Yes [provider]  DULoxetine (CYMBALTA) 30 MG capsule TAKE 1 CAPSULE BY MOUTH EVERY DAY Patient taking differently: Take 30 mg by mouth daily. 11/03/20  Yes [name], [name], NP  Ferrous Sulfate (IRON PO) Take 325 mg by mouth daily.    Yes [provider]  furosemide (LASIX) 80 MG tablet Take 120 mg by mouth 2 (two) times daily.   Yes [provider]  mycophenolate (MYFORTIC) 180 MG EC tablet Take 180 mg by mouth 2 (two) times daily.   Yes [provider]  ondansetron (ZOFRAN) 4 MG tablet Take 1  tablet (4 mg total) by mouth every 6 (six) hours as needed for nausea. 11/18/19  Yes Aline August, MD  potassium chloride (KLOR-CON) 10 MEQ tablet Take 10 mEq by mouth daily. 11/24/20  Yes [provider]  predniSONE (DELTASONE) 5 MG tablet Take 5 mg by mouth daily. 02/21/21  Yes [provider]  tacrolimus (PROGRAF) 1 MG capsule Take 1-2 mg by mouth See admin instructions. 2 mg in the morning 1 mg at bedtime   Yes [provider]  VENTOLIN HFA 108 (90 Base) MCG/ACT inhaler Inhale 2 puffs into the lungs 2 (two) times daily as needed. 10/21/19  Yes [provider]  furosemide (LASIX) 40 MG tablet Take 1 tablet (40 mg total) by mouth 2 (two) times daily. Patient not taking: No sig reported 01/10/21   Haydee Salter, MD  HYDROcodone bit-homatropine (HYCODAN) 5-1.5 MG/5ML syrup Take 5 mLs by mouth every 8 (eight) hours as needed for cough. Patient not taking: No sig reported 01/10/21   Haydee Salter, MD    Allergies    Ace inhibitors, Amiodarone, Amlodipine, Warfarin, and Warfarin sodium  Review of Systems   Review of Systems  Constitutional:  Positive for fatigue. Negative for chills and fever.  HENT:  Negative for ear pain and sore throat.   Eyes:  Negative for pain and visual disturbance.  Respiratory:  Negative for cough and shortness of breath.   Cardiovascular:  Negative for chest pain and palpitations.  Gastrointestinal:  Positive for blood in stool. Negative for abdominal pain and vomiting.  Genitourinary:  Negative for dysuria and hematuria.  Musculoskeletal:  Negative for arthralgias and back pain.  Skin:  Negative for color change and rash.  Neurological:  Negative for seizures and syncope.  All other  systems reviewed and are negative.  Physical Exam Updated Vital Signs BP (!) 98/57   Pulse 70   Temp 98.7 F (37.1 C)   Resp 18   Ht 5\' 5"  (1.651 m)   Wt 60.3 kg   SpO2 100%   BMI 22.13 kg/m   Physical Exam Vitals and nursing note  reviewed.  Constitutional:      General: She is not in acute distress.    Appearance: She is well-developed.  HENT:     Head: Normocephalic and atraumatic.  Eyes:     Conjunctiva/sclera: Conjunctivae normal.  Cardiovascular:     Rate and Rhythm: Normal rate and regular rhythm.     Heart sounds: No murmur heard. Pulmonary:     Effort: Pulmonary effort is normal. No respiratory distress.     Breath sounds: Normal breath sounds.  Abdominal:     Palpations: Abdomen is soft.     Tenderness: There is no abdominal tenderness.  Genitourinary:    Comments: Frank blood on DRE. Musculoskeletal:     Cervical back: Neck supple.  Skin:    General: Skin is warm and dry.     Capillary Refill: Capillary refill takes less than 2 seconds.  Neurological:     General: No focal deficit present.     Mental Status: She is alert and oriented to person, place, and time. Mental status is at baseline.    ED Results / Procedures / Treatments   Labs (all labs ordered are listed, but only abnormal results are displayed) Labs Reviewed  CBC WITH DIFFERENTIAL/PLATELET - Abnormal; Notable for the following components:      Result Value   WBC 24.9 (*)    RBC 2.66 (*)    Hemoglobin 7.1 (*)    HCT 22.6 (*)    RDW 16.9 (*)    Platelets 511 (*)    Neutro Abs 21.7 (*)    Monocytes Absolute 1.2 (*)    Abs Immature Granulocytes 0.97 (*)    All other components within normal limits  COMPREHENSIVE METABOLIC PANEL - Abnormal; Notable for the following components:   Sodium 133 (*)    Potassium 5.4 (*)    Glucose, Bld 143 (*)    BUN 111 (*)    Creatinine, Ser 4.97 (*)    Calcium 8.6 (*)    Total Protein 5.3 (*)    Albumin 2.4 (*)    AST 10 (*)    GFR, Estimated 9 (*)    All other components within normal limits  I-STAT CHEM 8, ED - Abnormal; Notable for the following components:   Sodium 134 (*)    Potassium 5.4 (*)    BUN 112 (*)    Creatinine, Ser 5.10 (*)    Glucose, Bld 128 (*)    Hemoglobin 8.2  (*)    HCT 24.0 (*)    All other components within normal limits  PROTIME-INR  TYPE AND SCREEN  PREPARE RBC (CROSSMATCH)    EKG None  Radiology No results found.  Procedures Procedures   Medications Ordered in ED Medications  0.9 %  sodium chloride infusion (has no administration in time range)  pantoprazole (PROTONIX) 80 mg /NS 100 mL IVPB (0 mg Intravenous Stopped 03/18/21 2156)  lactated ringers bolus 1,000 mL (1,000 mLs Intravenous New Bag/Given 03/18/21 2132)    ED Course  I have reviewed the triage vital signs and the nursing notes.  Pertinent labs & imaging results that were available during my care of the patient  were reviewed by me and considered in my medical decision making (see chart for details).    MDM Rules/Calculators/A&P                           75 y.o. female with past medical history as above who presents for evaluation of melena.  Of note, patient has a prior history of GI bleeds with last EGD and colonoscopy in 11/2019, which demonstrated gastric erosions as well as with diverticulosis.  Today, she is afebrile and hemodynamically stable, with borderline hypotensive blood pressure readings.  Abdomen is benign.  Frank blood on DRE.  CBC shows approximately six-point drop to 7.1, when compared to baseline 13.1 from 1 year ago.  Leukocytosis with left shift.  Acute renal failure with creatinine 4.97 from baseline 2.3, as well as uremia at 111.  Continues to have normal urine output, per patient report.  Potassium is mildly elevated at 5.4.  Gastroenterology was consulted for further recommendations, will plan for EGD and colonoscopy in the morning.  Hospitalist was consulted for admission.  Final Clinical Impression(s) / ED Diagnoses Final diagnoses:  Acute GI bleeding   Rx / DC Orders ED Discharge Orders     None        Violet Baldy, MD 03/18/21 2333    Lucrezia Starch, MD 03/19/21 7225    Lucrezia Starch, MD 03/19/21 1515

## 2021-03-18 NOTE — H&P (Signed)
History and Physical    MARKISHA MEDING XTK:[id number] DOB: 03/31/1946 DOA: 03/18/2021  PCP: [name], NP  Patient coming from: Home via EMS  I have personally briefly reviewed patient's old medical records in [place]  Chief Complaint: GI bleeding, syncope  HPI: [name] is a 75 y.o. female with medical history significant for ESRD s/p renal transplant 2017 on chronic immunosuppression, history of ICH while on warfarin 2010, persistent atrial fibrillation s/p AV nodal ablation and PPM, s/p LAA closure with watchman, DVT/PE s/p IVC filter, chronic diastolic CHF (EF 99-24% 08/6832), hypertension, history of GI bleed, asthma, depression/anxiety, and OSA who presented to the ED for evaluation of GI bleeding and syncope.  Patient states about 1 week ago she began having frequent stools consisting of dark red blood.  She has had some episodes of nausea with scant nonbloody emesis as well.  She says she took some antinausea pills with relief.  She then became constipated and took some medications to help with her bowel movements.  Earlier today she felt an urgent need to move her bowels and she was rushing to the bathroom when she syncopized.  She did note dark black appearing stools today.  She says she did not injure herself during her syncopal event.  She denies any chest pain, dyspnea, abdominal pain, dysuria.  She says she does take aspirin 81 mg daily but no other blood thinners.  EMS were called and per ED triage documentation patient did have 3 syncopal episodes when they are trying to sit the patient up.  She was brought to the ED for further evaluation and management.   ED Course:  Initial vitals showed BP 97/59, pulse 75, RR 26, temp 97.7 F, SPO2 100% on room air.  Labs show WBC 24.9, hemoglobin 7.1 (previously 13.19 03/13/2020), platelets 511,000, sodium 133, potassium 5.4, bicarb 23, BUN 111, creatinine 4.97 (previously 2.31 on 03/13/2020), serum glucose 143, INR  1.2.  Patient was given 1 L LR and IV Protonix 80 mg once.  GI were consulted and recommended admission and will plan for EGD and colonoscopy in the morning.  Patient was ordered to receive 1 unit PRBC transfusion.  The hospitalist service was consulted to admit for further evaluation and management.  Review of Systems: All systems reviewed and are negative except as documented in history of present illness above.   Past Medical History:  Diagnosis Date   Acute diastolic (congestive) heart failure ([place]) 08/12/2017   Acute kidney injury superimposed on chronic kidney disease ([place]) 11/17/2017   AKI (acute kidney injury) ([place]) 11/04/2017   Anemia    Anxiety    ARF (acute renal failure) ([place]) 06/06/2011   Arthritis    "shoulders; arms; hips" (02/04/2018)   Asthma    Atrial fibrillation (HCC)    Atrial flutter ([place])    s/p aflutter ablation at [facility]   Bacteriuria, asymptomatic 11/14/2020   Benign hypertension with ESRD (end-stage renal disease) ([place])    Blood transfusion    never had a reaction to blood transfusion   Cardiomyopathy Mar 2013   Mild, EF 50-55% by Mar 2013 ECHO, diast dysfxn II   Cardiomyopathy ([place])    CHF (congestive heart failure) ([place]) 2005   CHF (congestive heart failure) ([place])    Childhood asthma    Closed fracture of right distal radius 10/18/2016   CMV (cytomegalovirus infection) ([place]) 10/03/2015   Constipation    takes Miralax daily   Constipation    Depression  takes Zoloft daily   Distal radius fracture, right    DVT of lower extremity, bilateral ([place]) 12/21/11   "they're there now; been there for 2 wks"   Eczema    End stage renal disease ([place]) 11/06/2011   ESRD (end stage renal disease) on dialysis [facility]) 09/2011   07-19-2015 had Kidney transplant at [facility]; "don't get dialysis anymore" (02/04/2018)   Essential hypertension 07/07/2007   Qualifier: Diagnosis of  By: Garen Grams     Fractures, stress    in both feet--6 OR 7 YRS AGO--RESOLVED   Generalized  edema [id number]   Gout    doesn't require meds    HCAP (healthcare-associated pneumonia) 09/10/2011   Hearing difficulty    High cholesterol    History of CVA (cerebrovascular accident) without residual deficits    History of hip replacement, total, right    History of pneumonia    Hx of Clostridium difficile infection    Hypercalcemia    09/10/11   Hyperparathyroidism ([place]) 04/07/2013   Hypertension    takes Diltiazem daily    Hypomagnesemia 08/02/2015   Hypophosphatemia 11/08/2017   ICB (intracranial bleed) ([place])    Immunosuppression ([place]) 07/25/2015   Memory changes    Morbid obesity ([place])    Nonischemic dilated cardiomyopathy ([place])    Obesity 04/07/2013   Oligouria    OSA on CPAP    PAF (paroxysmal atrial fibrillation) (HCC)     HX OF CEREBRAL BLEED WHILE ON COUMADIN-SO PT NOT ON ANY BLOOD THINNERS NOW   Peripheral vascular disease ([place])    Presence of Watchman left atrial appendage closure device 10/04/2017   Proteinuria 09/04/2016   Psoriasis 08/07/2016   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/25/2015   S/P insertion of IVC (inferior vena caval) filter    Sepsis ([place]) 11/08/2017   Stress fracture    bilateral feet   Stroke ([place]) 2009   denies residual on 02/04/2018;  hemorrhagic now off coumadin   Stroke due to intracerebral hemorrhage ([place]) 2009   Suture reaction 09/04/2016   Transfusion history    Use of cane as ambulatory aid     Past Surgical History:  Procedure Laterality Date   AV FISTULA PLACEMENT  09/28/2011   Procedure: ARTERIOVENOUS (AV) FISTULA CREATION;  Surgeon: Rosetta Posner, MD;  Location: [facility] OR;  Service: Vascular;  Laterality: Left;   [place] DEFECT   CHOLECYSTECTOMY     1980's   COLONOSCOPY     COLONOSCOPY WITH PROPOFOL N/A 11/16/2019   Procedure: COLONOSCOPY WITH PROPOFOL;  Surgeon: Juanita Craver, MD;  Location: [facility] ENDOSCOPY;  Service: Endoscopy;  Laterality: N/A;   CYSTOSCOPY      many yrs ago   ESOPHAGOGASTRODUODENOSCOPY (EGD) WITH PROPOFOL N/A 11/16/2019   Procedure: ESOPHAGOGASTRODUODENOSCOPY (EGD) WITH PROPOFOL;  Surgeon: Juanita Craver, MD;  Location: [facility] ENDOSCOPY;  Service: Endoscopy;  Laterality: N/A;   FEMUR FRACTURE SURGERY Right 03/2011; 09/03/2011   "had 2, 2 wk apart in 2012; broke it again 08/2011 & had OR"   FRACTURE SURGERY     I & D EXTREMITY  09/15/2011   Procedure: IRRIGATION AND DEBRIDEMENT EXTREMITY w REMOVAL OF HARDWARE;  Surgeon: [name], MD;  Location: [facility];  Service: Orthopedics;  Laterality: Right;   INCISION AND DRAINAGE HIP  12/21/2011   Procedure: IRRIGATION AND DEBRIDEMENT HIP;  Surgeon: [name], MD;  Location: [facility];  Service: Orthopedics;  Laterality: Right;  I&D RIGHT HIP WITH PLACEMENT ANTIBIOTIC SPACER   INSERTION OF DIALYSIS CATHETER     Procedure: INSERTION OF DIALYSIS CATHETER;  Surgeon: Rosetta Posner, MD;  Location: [facility];  Service: Vascular;  Laterality: Right;   IRRIGATION AND DEBRIDEMENT ABSCESS  12/21/11   right hip   JOINT REPLACEMENT     KIDNEY TRANSPLANT  07/19/15   LAPAROSCOPIC GASTRIC BANDING  2008   LEFT ATRIAL APPENDAGE OCCLUSION  10/04/2017   OPEN REDUCTION INTERNAL FIXATION (ORIF) DISTAL RADIAL FRACTURE Right 10/18/2016   Procedure: OPEN REDUCTION INTERNAL FIXATION (ORIF) RIGHT DISTAL RADIAL FRACTURE;  Surgeon: Marchia Bond, MD;  Location: [facility];  Service: Orthopedics;  Laterality: Right;   TOTAL HIP ARTHROPLASTY Right 02/2011   right THA 02/2011, I&D/removal of hardware 09/2011,, repeat I&D Jun 2013, reimplantation R THA 03-26-2012   TOTAL HIP REVISION  03/25/2012   Procedure: TOTAL HIP REVISION;  Surgeon: [name], MD;  Location: [facility] ORS;  Service: Orthopedics;  Laterality: Right;  Right Total Hip Reimplantation   TUBAL LIGATION     VENA CAVA FILTER PLACEMENT  11/2011    Social History:  reports that she has never smoked. She has never used smokeless tobacco. She reports that she does not drink  alcohol and does not use drugs.  Allergies  Allergen Reactions   Ace Inhibitors Other (See Comments)    Reaction unknown   Amiodarone Other (See Comments)    Pt with Restrictive Lung Disease by 10/2017 PFT with decreased DLCO   Amlodipine    Warfarin Other (See Comments)    Left side brain hemorrhage   Warfarin Sodium Other (See Comments)    Caused her to have a stroke    Family History  Problem Relation Age of Onset   Cancer Father    Diabetes Mother    Hypertension Mother    Arthritis Mother    Hodgkin's lymphoma Other 100       dscd---HODGKINS DISEASE   Hypertension Brother      Prior to Admission medications   Medication Sig Start Date End Date Taking? Authorizing Provider  acetaminophen (TYLENOL) 500 MG tablet Take 500 mg by mouth every 6 (six) hours as needed for mild pain or headache.    Yes [provider]  albuterol (PROVENTIL) (2.5 MG/3ML) 0.083% nebulizer solution Take 3 mLs (2.5 mg total) by nebulization every 6 (six) hours as needed for wheezing or shortness of breath. Dx:J45.909 10/25/19  Yes Rai, Ripudeep K, MD  amLODipine (NORVASC) 2.5 MG tablet Take 2.5 mg by mouth daily. 01/25/21  Yes [provider]  aspirin EC 81 MG tablet Take 81 mg by mouth daily. Swallow whole.   Yes [provider]  budesonide-formoterol (SYMBICORT) 160-4.5 MCG/ACT inhaler Inhale 2 puffs into the lungs 2 (two) times daily.   Yes [provider]  calcitRIOL (ROCALTROL) 0.25 MCG capsule Take 0.25 mcg by mouth daily. 12/13/19  Yes [provider]  carvedilol (COREG) 12.5 MG tablet Take 2 tablets (25 mg total) by mouth 2 (two) times daily. 11/18/19  Yes Aline August, MD  docusate sodium (COLACE) 100 MG capsule Take 100 mg by mouth daily.   Yes [provider]  DULoxetine (CYMBALTA) 30 MG capsule TAKE 1 CAPSULE BY MOUTH EVERY DAY Patient taking differently: Take 30 mg by mouth daily. 11/03/20  Yes [name], [name], NP  Ferrous Sulfate (IRON  PO) Take 325 mg by mouth daily.    Yes [provider]  furosemide (LASIX) 80 MG  tablet Take 120 mg by mouth 2 (two) times daily.   Yes [provider]  mycophenolate (MYFORTIC) 180 MG EC tablet Take 180 mg by mouth 2 (two) times daily.   Yes [provider]  ondansetron (ZOFRAN) 4 MG tablet Take 1 tablet (4 mg total) by mouth every 6 (six) hours as needed for nausea. 11/18/19  Yes Aline August, MD  potassium chloride (KLOR-CON) 10 MEQ tablet Take 10 mEq by mouth daily. 11/24/20  Yes [provider]  predniSONE (DELTASONE) 5 MG tablet Take 5 mg by mouth daily. 02/21/21  Yes [provider]  tacrolimus (PROGRAF) 1 MG capsule Take 1-2 mg by mouth See admin instructions. 2 mg in the morning 1 mg at bedtime   Yes [provider]  VENTOLIN HFA 108 (90 Base) MCG/ACT inhaler Inhale 2 puffs into the lungs 2 (two) times daily as needed. 10/21/19  Yes [provider]  furosemide (LASIX) 40 MG tablet Take 1 tablet (40 mg total) by mouth 2 (two) times daily. Patient not taking: No sig reported 01/10/21   Haydee Salter, MD  HYDROcodone bit-homatropine (HYCODAN) 5-1.5 MG/5ML syrup Take 5 mLs by mouth every 8 (eight) hours as needed for cough. Patient not taking: No sig reported 01/10/21   Haydee Salter, MD    Physical Exam: Vitals:   03/18/21 2200 03/18/21 2327 03/18/21 2329 03/19/21 0008  BP: (!) 103/57 (!) 98/57 (!) 98/57 (!) 89/53  Pulse:  69 70 71  Resp: 16 (!) 21 18 (!) 22  Temp:  98.7 F (37.1 C) 98.7 F (37.1 C) 97.8 F (36.6 C)  TempSrc:  Oral  Oral  SpO2:  100% 100%   Weight:  60.3 kg    Height:  5\' 5"  (1.651 m)     Constitutional: Elderly woman resting supine in bed, NAD, calm, comfortable Eyes: PERRL, lids and conjunctivae normal ENMT: Mucous membranes are moist. Posterior pharynx clear of any exudate or lesions.Normal dentition.  Neck: normal, supple, no masses. Respiratory: clear to auscultation bilaterally, no wheezing, no  crackles. Normal respiratory effort. No accessory muscle use.  Cardiovascular: Regular rate and rhythm, no murmurs / rubs / gallops. No extremity edema. 2+ pedal pulses. Abdomen: no tenderness, no masses palpated. No hepatosplenomegaly. Bowel sounds positive.  Musculoskeletal: no clubbing / cyanosis. No joint deformity upper and lower extremities. Good ROM, no contractures. Normal muscle tone.  Skin: no rashes, lesions, ulcers. No induration Neurologic: CN 2-12 grossly intact. Sensation intact. Strength 5/5 in all 4.  Psychiatric: Normal judgment and insight. Alert and oriented x 3. Normal mood.   Labs on Admission: I have personally reviewed following labs and imaging studies  CBC: Recent Labs  Lab 03/18/21 2036 03/18/21 2242  WBC 24.9*  --   NEUTROABS 21.7*  --   HGB 7.1* 8.2*  HCT 22.6* 24.0*  MCV 85.0  --   PLT 511*  --    Basic Metabolic Panel: Recent Labs  Lab 03/18/21 2036 03/18/21 2242  NA 133* 134*  K 5.4* 5.4*  CL 100 103  CO2 23  --   GLUCOSE 143* 128*  BUN 111* 112*  CREATININE 4.97* 5.10*  CALCIUM 8.6*  --    GFR: Estimated Creatinine Clearance: 9 mL/min (A) (by C-G formula based on SCr of 5.1 mg/dL (H)). Liver Function Tests: Recent Labs  Lab 03/18/21 2036  AST 10*  ALT 10  ALKPHOS 87  BILITOT 0.5  PROT 5.3*  ALBUMIN 2.4*   No results for input(s): LIPASE,  AMYLASE in the last 168 hours. No results for input(s): AMMONIA in the last 168 hours. Coagulation Profile: Recent Labs  Lab 03/18/21 2036  INR 1.2   Cardiac Enzymes: No results for input(s): CKTOTAL, CKMB, CKMBINDEX, TROPONINI in the last 168 hours. BNP (last 3 results) No results for input(s): PROBNP in the last 8760 hours. HbA1C: No results for input(s): HGBA1C in the last 72 hours. CBG: No results for input(s): GLUCAP in the last 168 hours. Lipid Profile: No results for input(s): CHOL, HDL, LDLCALC, TRIG, CHOLHDL, LDLDIRECT in the last 72 hours. Thyroid Function Tests: No results  for input(s): TSH, T4TOTAL, FREET4, T3FREE, THYROIDAB in the last 72 hours. Anemia Panel: No results for input(s): VITAMINB12, FOLATE, FERRITIN, TIBC, IRON, RETICCTPCT in the last 72 hours. Urine analysis:    Component Value Date/Time   COLORURINE AMBER (A) 02/04/2018 1540   APPEARANCEUR CLOUDY (A) 02/04/2018 1540   LABSPEC 1.017 02/04/2018 1540   PHURINE 5.0 02/04/2018 1540   GLUCOSEU NEGATIVE 02/04/2018 1540   HGBUR LARGE (A) 02/04/2018 1540   HGBUR negative 10/04/2009 1044   BILIRUBINUR NEGATIVE 02/04/2018 1540   BILIRUBINUR negative 12/13/2017 1508   KETONESUR NEGATIVE 02/04/2018 1540   PROTEINUR 100 (A) 02/04/2018 1540   UROBILINOGEN 0.2 12/13/2017 1508   UROBILINOGEN 0.2 03/25/2012 1354   NITRITE NEGATIVE 02/04/2018 1540   LEUKOCYTESUR MODERATE (A) 02/04/2018 1540    Radiological Exams on Admission: No results found.  EKG: Personally reviewed. V paced rhythm.  Similar to prior.  Assessment/Plan Principal Problem:   Acute GI bleeding Active Problems:   Symptomatic anemia   Obstructive sleep apnea   Hyperkalemia   Persistent atrial fibrillation ([place])   Immunosuppression (HCC)   [name] is a 75 y.o. female with medical history significant for ESRD s/p renal transplant 2017 on chronic immunosuppression, history of ICH while on warfarin 2010, persistent atrial fibrillation s/p AV nodal ablation and PPM, s/p LAA closure with watchman, DVT/PE s/p IVC filter, chronic diastolic CHF (EF 97-02% 12/3783), hypertension, history of GI bleed, asthma, depression/anxiety, and OSA who is admitted with symptomatic anemia due to acute GI bleed.  Symptomatic anemia due to acute GI bleeding: History suggestive of upper GI bleed.  Hemoglobin 7.1 on admission. -GI consulted and to see in a.m. -Order to receive 1 unit PRBC transfusion -Monitor H&H every 6 hours -Continue IV Protonix 40 mg twice daily -Hold home aspirin -Keep n.p.o.  Acute kidney injury superimposed on ESRD s/p  renal transplant in 2017 with baseline GFR 25-27: Creatinine 4.97 on admission with GFR 9.  Suspect due to blood loss anemia. -Continue with Henrene Pastor transfusion as above -Placed on IV fluid hydration with NS@100  mL/hour overnight -Continue home immunosuppressants: Prednisone 5 mg daily, Myfortic 180 mg twice daily, Prograf 2 mg a.m. and 1 mg nightly  Hyperkalemia: In setting of AKI on CKD.  Give dose of Lokelma tonight.  Paroxysmal atrial fibrillation s/p AVN ablation and PPM: In paced rhythm on admission.  Not on anticoagulation due to history of hemorrhagic stroke while on Coumadin.  Chronic diastolic CHF: EF 88-50%.  Volume depleted on admission.  Receiving IV fluids as above.  Holding home Lasix.  Monitor strict I/O's.  Asthma: Chronic and stable.  Continue Symbicort and albuterol as needed.  Hypertension: Holding home antihypertensives due to hypotension on arrival.  Depression/anxiety: Continue Cymbalta.  OSA: Continue CPAP nightly.   DVT prophylaxis: SCDs Code Status: Full code, confirmed with patient Family Communication: Discussed with patient, she has discussed with family Disposition Plan:  From home and likely return to home pending clinical progress Consults called: Gastroenterology Level of care: Progressive Admission status:  Status is: Inpatient  Remains inpatient appropriate because:Hemodynamically unstable, Ongoing diagnostic testing needed not appropriate for outpatient work up, IV treatments appropriate due to intensity of illness or inability to take PO, and Inpatient level of care appropriate due to severity of illness  Dispo: The patient is from: Home              Anticipated d/c is to: Home              Patient currently is not medically stable to d/c.    Zada Finders MD [facility]  If 7PM-7AM, please contact night-coverage [email or web address]  03/19/2021, 12:19 AM

## 2021-03-18 NOTE — ED Notes (Signed)
Blood transfusion  consent signed by pt's daughter.

## 2021-03-19 LAB — CBC
HCT: 21.6 % — ABNORMAL LOW (ref 36.0–46.0)
Hemoglobin: 7.5 g/dL — ABNORMAL LOW (ref 12.0–15.0)
MCH: 28.2 pg (ref 26.0–34.0)
MCHC: 34.7 g/dL (ref 30.0–36.0)
MCV: 81.2 fL (ref 80.0–100.0)
Platelets: 209 10*3/uL (ref 150–400)
RBC: 2.66 MIL/uL — ABNORMAL LOW (ref 3.87–5.11)
RDW: 16.3 % — ABNORMAL HIGH (ref 11.5–15.5)
WBC: 17.9 10*3/uL — ABNORMAL HIGH (ref 4.0–10.5)
nRBC: 0 % (ref 0.0–0.2)

## 2021-03-19 LAB — BASIC METABOLIC PANEL
Anion gap: UNDETERMINED (ref 5–15)
BUN: UNDETERMINED mg/dL (ref 8–23)
CO2: UNDETERMINED mmol/L (ref 22–32)
Calcium: UNDETERMINED mg/dL (ref 8.9–10.3)
Chloride: UNDETERMINED mmol/L (ref 98–111)
Creatinine, Ser: UNDETERMINED mg/dL (ref 0.44–1.00)
GFR, Estimated: UNDETERMINED mL/min (ref >60)
Glucose, Bld: UNDETERMINED mg/dL (ref 70–99)
Potassium: UNDETERMINED mmol/L (ref 3.5–5.1)
Sodium: UNDETERMINED mmol/L (ref 135–145)

## 2021-03-19 LAB — CBC WITH DIFFERENTIAL/PLATELET
Abs Immature Granulocytes: 0 10*3/uL (ref 0.00–0.07)
Basophils Absolute: 0.2 10*3/uL — ABNORMAL HIGH (ref 0.0–0.1)
Basophils Relative: 1 %
Eosinophils Absolute: 0 10*3/uL (ref 0.0–0.5)
Eosinophils Relative: 0 %
HCT: 21.3 % — ABNORMAL LOW (ref 36.0–46.0)
Hemoglobin: 6.9 g/dL — CL (ref 12.0–15.0)
Lymphocytes Relative: 2 %
Lymphs Abs: 0.3 10*3/uL — ABNORMAL LOW (ref 0.7–4.0)
MCH: 27.9 pg (ref 26.0–34.0)
MCHC: 32.4 g/dL (ref 30.0–36.0)
MCV: 86.2 fL (ref 80.0–100.0)
Monocytes Absolute: 0 10*3/uL — ABNORMAL LOW (ref 0.1–1.0)
Monocytes Relative: 0 %
Neutro Abs: 16.6 10*3/uL — ABNORMAL HIGH (ref 1.7–7.7)
Neutrophils Relative %: 97 %
Platelets: 396 10*3/uL (ref 150–400)
RBC: 2.47 MIL/uL — ABNORMAL LOW (ref 3.87–5.11)
RDW: 17.1 % — ABNORMAL HIGH (ref 11.5–15.5)
WBC: 17.1 10*3/uL — ABNORMAL HIGH (ref 4.0–10.5)
nRBC: 0 % (ref 0.0–0.2)
nRBC: 0 /100 WBC

## 2021-03-19 LAB — PREPARE RBC (CROSSMATCH)

## 2021-03-19 LAB — HEMOGLOBIN AND HEMATOCRIT, BLOOD
HCT: 22 % — ABNORMAL LOW (ref 36.0–46.0)
Hemoglobin: 7.2 g/dL — ABNORMAL LOW (ref 12.0–15.0)

## 2021-03-19 LAB — FERRITIN: Ferritin: 249 ng/mL (ref 11–307)

## 2021-03-19 LAB — FOLATE: Folate: 5.8 ng/mL — ABNORMAL LOW (ref >5.9)

## 2021-03-19 LAB — VITAMIN B12: Vitamin B-12: 650 pg/mL (ref 180–914)

## 2021-03-19 MED ORDER — SODIUM CHLORIDE 0.9% FLUSH
3.0000 mL | Freq: Two times a day (BID) | INTRAVENOUS | Status: DC
  Administered 2021-03-19 – 2021-03-25 (×8): 3 mL via INTRAVENOUS

## 2021-03-19 MED ORDER — SODIUM CHLORIDE 0.9 % IV SOLN: INTRAVENOUS | Status: DC

## 2021-03-19 MED ORDER — TACROLIMUS 1 MG PO CAPS
2.0000 mg | ORAL_CAPSULE | Freq: Every day | ORAL | Status: DC
  Administered 2021-03-19 – 2021-03-25 (×7): 2 mg via ORAL
  Filled 2021-03-19 (×8): qty 2

## 2021-03-19 MED ORDER — ONDANSETRON HCL 4 MG PO TABS: 4.0000 mg | ORAL_TABLET | Freq: Four times a day (QID) | ORAL | Status: DC | PRN

## 2021-03-19 MED ORDER — ALBUTEROL SULFATE (2.5 MG/3ML) 0.083% IN NEBU: 2.5000 mg | INHALATION_SOLUTION | Freq: Four times a day (QID) | RESPIRATORY_TRACT | Status: DC | PRN

## 2021-03-19 MED ORDER — ONDANSETRON HCL 4 MG/2ML IJ SOLN: 4.0000 mg | Freq: Four times a day (QID) | INTRAMUSCULAR | Status: DC | PRN

## 2021-03-19 MED ORDER — PANTOPRAZOLE SODIUM 40 MG PO TBEC
40.0000 mg | DELAYED_RELEASE_TABLET | Freq: Every day | ORAL | Status: DC
  Administered 2021-03-20: 40 mg via ORAL
  Filled 2021-03-19: qty 1

## 2021-03-19 MED ORDER — ACETAMINOPHEN 325 MG PO TABS
650.0000 mg | ORAL_TABLET | Freq: Once | ORAL | Status: AC
  Administered 2021-03-19: 650 mg via ORAL
  Filled 2021-03-19: qty 2

## 2021-03-19 MED ORDER — DULOXETINE HCL 30 MG PO CPEP
30.0000 mg | ORAL_CAPSULE | Freq: Every day | ORAL | Status: DC
  Administered 2021-03-19 – 2021-03-25 (×7): 30 mg via ORAL
  Filled 2021-03-19 (×8): qty 1

## 2021-03-19 MED ORDER — MOMETASONE FURO-FORMOTEROL FUM 200-5 MCG/ACT IN AERO
2.0000 | INHALATION_SPRAY | Freq: Two times a day (BID) | RESPIRATORY_TRACT | Status: DC
  Administered 2021-03-19 – 2021-03-25 (×12): 2 via RESPIRATORY_TRACT
  Filled 2021-03-19: qty 8.8

## 2021-03-19 MED ORDER — ACETAMINOPHEN 650 MG RE SUPP: 650.0000 mg | Freq: Four times a day (QID) | RECTAL | Status: DC | PRN

## 2021-03-19 MED ORDER — DIPHENHYDRAMINE HCL 25 MG PO CAPS
25.0000 mg | ORAL_CAPSULE | Freq: Once | ORAL | Status: AC
  Administered 2021-03-19: 25 mg via ORAL
  Filled 2021-03-19: qty 1

## 2021-03-19 MED ORDER — MYCOPHENOLATE SODIUM 180 MG PO TBEC
180.0000 mg | DELAYED_RELEASE_TABLET | Freq: Two times a day (BID) | ORAL | Status: DC
  Administered 2021-03-19 – 2021-03-25 (×13): 180 mg via ORAL
  Filled 2021-03-19 (×15): qty 1

## 2021-03-19 MED ORDER — ACETAMINOPHEN 325 MG PO TABS
650.0000 mg | ORAL_TABLET | Freq: Four times a day (QID) | ORAL | Status: DC | PRN
  Administered 2021-03-19 – 2021-03-24 (×5): 650 mg via ORAL
  Filled 2021-03-19 (×4): qty 2

## 2021-03-19 MED ORDER — SODIUM CHLORIDE 0.9% IV SOLUTION: Freq: Once | INTRAVENOUS | Status: AC

## 2021-03-19 MED ORDER — TACROLIMUS 1 MG PO CAPS
1.0000 mg | ORAL_CAPSULE | Freq: Every day | ORAL | Status: DC
  Administered 2021-03-19 – 2021-03-24 (×7): 1 mg via ORAL
  Filled 2021-03-19 (×8): qty 1

## 2021-03-19 MED ORDER — FUROSEMIDE 10 MG/ML IJ SOLN
20.0000 mg | Freq: Once | INTRAMUSCULAR | Status: AC
  Administered 2021-03-19: 20 mg via INTRAVENOUS
  Filled 2021-03-19: qty 2

## 2021-03-19 MED ORDER — PREDNISONE 5 MG PO TABS
5.0000 mg | ORAL_TABLET | Freq: Every day | ORAL | Status: DC
  Administered 2021-03-19 – 2021-03-25 (×7): 5 mg via ORAL
  Filled 2021-03-19 (×7): qty 1

## 2021-03-19 NOTE — Progress Notes (Signed)
Pt refusing CPAP stateing she has one but dont wear it.

## 2021-03-19 NOTE — Progress Notes (Signed)
PROGRESS NOTE   [name]  ZJQ:[id number] DOB: 1945/11/12 DOA: 03/18/2021 PCP: [name], NP  Brief Narrative:  15 B Fem home dwelling  ESRD s/p renal transplant 2017 on chronic immunosuppression Atrial fibrillation AV nodal ablation + PPM placement/watchman procedure COVID recovered after hospitalization 8/27 through 03/13/2020 Prior pulmonary embolism/DVT/IVC complicated by intracerebral hemorrhage 2010 now off anticoagulation Prior lower GI bleeds with diverticulosis 11/18/2019 [follows with Dr. Collene Mares HF PEF per echo 10/22/2020 , on 24-09% mild diastolic heart failure, right ventricular overload previously OSA  1 week history of prior to admission frequent stool dark red blood nausea scant nonbloody emesis--noted dark black stools 9/3 and syncopized at home  Brought to ED and syncopized again when trying to sit up WBC found to be 24 hemoglobin previously 13, on admission 7.1 platelet 511,  potassium 5.4 BUNs/creatinine 111/4.9  [prior creatinine 2.3]  Reviewed in the afternoon and felt to have some clots well passing stool   Hospital-Problem based course  Undifferentiated GI bleed Keep n.p.o. hold aspirin given transfusion Hydrate IV fluid 100 cc/H--we will need to be careful with fluid as is a renal patient GI recommending watchful waiting and allowing clear liquids --If patient has further clots nursing is to inform me and we will get a hemoglobin check earlier than tomorrow Protonix 40 twice daily Anemia of acute blood loss from bleeding 1 unit blood transfused 9/3-labs as above Hold oral iron at this time-iron labs on next blood draw Leukocytosis on admission No fever-this is probably all reactive and could be related to steroids that were given-recheck labs in Hyperkalemia on admission ESRD with renal transplant on chronic immunosuppression Continue mycophenolate 180 twice daily, Prograf 2 mg a.m. 1 mg p.m. and prednisone 5 Nephrology has been made aware and  will see in consult Continue calcitriol when able to take p.o. 1 dose Lokelma given Atrial fibrillation status post multiple procedures not on anticoagulation secondary to intracerebral hemorrhage in the past Keep monitors-Coreg 25 twice daily held and will resume if more hemodynamically stable HFpEF 55-60% previously Hold Lasix dosing at this time Syncope on admission likely secondary to orthostasis from bleeding HTN Multiple meds have been held given risk for hypotension Will resume slowly in the outpatient setting Depression Continue Cymbalta 30 daily    DVT prophylaxis: SCD Code Status: Full Family Communication: None Disposition:  Status is: Inpatient  Remains inpatient appropriate because:Hemodynamically unstable, Altered mental status, Ongoing diagnostic testing needed not appropriate for outpatient work up, and IV treatments appropriate due to intensity of illness or inability to take PO  Dispo: The patient is from: Home              Anticipated d/c is to: Home              Patient currently is not medically stable to d/c.   Difficult to place patient No   Consultants:  Gastroenterology  Procedures:   Antimicrobials:     Subjective:  Patient past clots and stool over the past hour or so-I saw this personally, no chest pain no fever Feels comfortable no dizziness  Objective: Vitals:   03/19/21 0357 03/19/21 0500 03/19/21 0758 03/19/21 0813  BP: (!) 104/58  (!) 99/56   Pulse: 69  70   Resp: (!) 24  20   Temp: 98 F (36.7 C)  97.8 F (36.6 C)   TempSrc: Oral  Oral   SpO2: 99%  98% 100%  Weight:  60.3 kg    Height:  Intake/Output Summary (Last 24 hours) at 03/19/2021 0942 Last data filed at 03/19/2021 0200 Gross per 24 hour  Intake 315 ml  Output --  Net 315 ml   Filed Weights   03/18/21 2327 03/19/21 0500  Weight: 60.3 kg 60.3 kg    Examination:  Coherent looks about stated age no distress S1-S2 no murmur no rub no gallop Chest clear no  added sound no rales rhonchi Abdomen soft no rebound no guarding I am able to palpate an area in her stomach which has a prior gastric band ROM intact no lower extremity edema   Data Reviewed: personally reviewed   CBC    Component Value Date/Time   WBC 24.9 (H) 03/18/2021 2036   RBC 2.66 (L) 03/18/2021 2036   HGB 7.2 (L) 03/19/2021 0808   HGB 10.8 (L) 11/27/2019 1616   HCT 22.0 (L) 03/19/2021 0808   HCT 33.8 (L) 11/27/2019 1616   PLT 511 (H) 03/18/2021 2036   PLT 284 11/27/2019 1616   MCV 85.0 03/18/2021 2036   MCV 88 11/27/2019 1616   MCH 26.7 03/18/2021 2036   MCHC 31.4 03/18/2021 2036   RDW 16.9 (H) 03/18/2021 2036   RDW 15.1 11/27/2019 1616   LYMPHSABS 1.1 03/18/2021 2036   LYMPHSABS 0.6 (L) 11/27/2019 1616   MONOABS 1.2 (H) 03/18/2021 2036   EOSABS 0.0 03/18/2021 2036   EOSABS 0.0 11/27/2019 1616   BASOSABS 0.1 03/18/2021 2036   BASOSABS 0.1 11/27/2019 1616   CMP Latest Ref Rng & Units 03/18/2021 03/18/2021 03/13/2020  Glucose 70 - 99 mg/dL 128(H) 143(H) 141(H)  BUN 8 - 23 mg/dL 112(H) 111(H) 77(H)  Creatinine 0.44 - 1.00 mg/dL 5.10(H) 4.97(H) 2.31(H)  Sodium 135 - 145 mmol/L 134(L) 133(L) 138  Potassium 3.5 - 5.1 mmol/L 5.4(H) 5.4(H) 4.6  Chloride 98 - 111 mmol/L 103 100 99  CO2 22 - 32 mmol/L - 23 28  Calcium 8.9 - 10.3 mg/dL - 8.6(L) 8.8(L)  Total Protein 6.5 - 8.1 g/dL - 5.3(L) -  Total Bilirubin 0.3 - 1.2 mg/dL - 0.5 -  Alkaline Phos 38 - 126 U/L - 87 -  AST 15 - 41 U/L - 10(L) -  ALT 0 - 44 U/L - 10 -     Radiology Studies: No results found.   Scheduled Meds:  DULoxetine  30 mg Oral Daily   mometasone-formoterol  2 puff Inhalation BID   mycophenolate  180 mg Oral BID   pantoprazole (PROTONIX) IV  40 mg Intravenous Q12H   predniSONE  5 mg Oral Daily   sodium chloride flush  3 mL Intravenous Q12H   tacrolimus  1 mg Oral QHS   tacrolimus  2 mg Oral Daily   Continuous Infusions:  sodium chloride 100 mL/hr at 03/19/21 0219     LOS: 1 day   Time  spent: [place], MD [facility] To contact the attending provider between 7A-7P or the covering provider during after hours 7P-7A, please log into the web site [email or web address] and access using universal [place] password for that web site. If you do not have the password, please call the hospital operator.  03/19/2021, 9:42 AM

## 2021-03-19 NOTE — Consult Note (Addendum)
Referring Provider:  [place] Primary Care Physician:  [name], NP Primary Gastroenterologist:  Dr.Mann  Reason for Consultation:  GI bleed  HPI: [name] is a 75 y.o. female with medical history significant for ESRD s/p renal transplant 2017 on chronic immunosuppression, history of ICH while on warfarin 2010, persistent atrial fibrillation s/p AV nodal ablation and PPM, s/p LAA closure with watchman, DVT/PE s/p IVC filter, chronic diastolic CHF (EF 56-81% 08/7515), hypertension, history of GI bleed due to diverticulosis in 11/2019, asthma, depression/anxiety, and OSA who presented to the ED for evaluation of GI bleeding and syncope.  She tells me that over the past couple weeks she has been having intermittent very dark maroon/burgundy colored stools.  This was not occurring every day.  Yesterday she went to the bathroom and while she was walking she fell against the door and fell to the floor.  She was incontinent of dark/maroon/burgundy colored stools.  Her husband took a picture and showed it to me.  She was hypotensive and BPs remain low, but improved.  She tells me that she has had some mild intermittent nausea for which she has been taking Zofran intermittently and that does help.  She denies any abdominal pain, but just does not feel right in her stomach at times.  She has had a decrease/poor appetite over the past 6 months or so.  No NSAID use.  She has not had any bowel movement since her admission.  Hgb 7.1 grams down from 13.1 grams in 02/2020.  S/p one unit PRBCs and went up to 8.2 grams and then back down to 7.2 grams.  She was hospitalized for GI bleed in May 2021 at which time she underwent EGD and colonoscopy as below.  This was deemed to be a diverticular bleed.  EGD 11/2019 with Dr. Collene Mares as follows:  The examined esophagus and the GEJ appeared widely patent and normal. A few dispersed erosions with stigmata of recent bleeding were found in the gastric antrum. The  cardia and gastric fundus were normal on retroflexion except for mucosal oprominence due to her gastric band. The examined duodenum was normal.  Colonoscopy at the same time showed only diverticulosis.   Past Medical History:  Diagnosis Date   Acute diastolic (congestive) heart failure ([place]) 08/12/2017   Acute kidney injury superimposed on chronic kidney disease ([place]) 11/17/2017   AKI (acute kidney injury) ([place]) 11/04/2017   Anemia    Anxiety    ARF (acute renal failure) ([place]) 06/06/2011   Arthritis    "shoulders; arms; hips" (02/04/2018)   Asthma    Atrial fibrillation (HCC)    Atrial flutter ([place])    s/p aflutter ablation at [facility]   Bacteriuria, asymptomatic 11/14/2020   Benign hypertension with ESRD (end-stage renal disease) ([place])    Blood transfusion    never had a reaction to blood transfusion   Cardiomyopathy Mar 2013   Mild, EF 50-55% by Mar 2013 ECHO, diast dysfxn II   Cardiomyopathy ([place])    CHF (congestive heart failure) ([place]) 2005   CHF (congestive heart failure) ([place])    Childhood asthma    Closed fracture of right distal radius 10/18/2016   CMV (cytomegalovirus infection) ([place]) 10/03/2015   Constipation    takes Miralax daily   Constipation    Depression    takes Zoloft daily   Distal radius fracture, right    DVT of lower extremity, bilateral ([place]) 12/21/11   "they're there now; been there for 2 wks"  Eczema    End stage renal disease ([place]) 11/06/2011   ESRD (end stage renal disease) on dialysis [facility]) 09/2011   07-19-2015 had Kidney transplant at [facility]; "don't get dialysis anymore" (02/04/2018)   Essential hypertension 07/07/2007   Qualifier: Diagnosis of  By: Garen Grams     Fractures, stress    in both feet--6 OR 7 YRS AGO--RESOLVED   Generalized edema [id number]   Gout    doesn't require meds    HCAP (healthcare-associated pneumonia) 09/10/2011   Hearing difficulty    High cholesterol    History of CVA (cerebrovascular accident) without residual deficits     History of hip replacement, total, right    History of pneumonia    Hx of Clostridium difficile infection    Hypercalcemia    09/10/11   Hyperparathyroidism ([place]) 04/07/2013   Hypertension    takes Diltiazem daily    Hypomagnesemia 08/02/2015   Hypophosphatemia 11/08/2017   ICB (intracranial bleed) ([place])    Immunosuppression ([place]) 07/25/2015   Memory changes    Morbid obesity ([place])    Nonischemic dilated cardiomyopathy ([place])    Obesity 04/07/2013   Oligouria    OSA on CPAP    PAF (paroxysmal atrial fibrillation) (HCC)     HX OF CEREBRAL BLEED WHILE ON COUMADIN-SO PT NOT ON ANY BLOOD THINNERS NOW   Peripheral vascular disease ([place])    Presence of Watchman left atrial appendage closure device 10/04/2017   Proteinuria 09/04/2016   Psoriasis 08/07/2016   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/25/2015   S/P insertion of IVC (inferior vena caval) filter    Sepsis ([place]) 11/08/2017   Stress fracture    bilateral feet   Stroke ([place]) 2009   denies residual on 02/04/2018;  hemorrhagic now off coumadin   Stroke due to intracerebral hemorrhage ([place]) 2009   Suture reaction 09/04/2016   Transfusion history    Use of cane as ambulatory aid     Past Surgical History:  Procedure Laterality Date   AV FISTULA PLACEMENT  09/28/2011   Procedure: ARTERIOVENOUS (AV) FISTULA CREATION;  Surgeon: Rosetta Posner, MD;  Location[facility] OR;  Service: Vascular;  Laterality: Left;   [place] DEFECT   CHOLECYSTECTOMY     1980's   COLONOSCOPY     COLONOSCOPY WITH PROPOFOL N/A 11/16/2019   Procedure: COLONOSCOPY WITH PROPOFOL;  Surgeon: Juanita Craver, MD;  Location: [facility] ENDOSCOPY;  Service: Endoscopy;  Laterality: N/A;   CYSTOSCOPY     many yrs ago   ESOPHAGOGASTRODUODENOSCOPY (EGD) WITH PROPOFOL N/A 11/16/2019   Procedure: ESOPHAGOGASTRODUODENOSCOPY (EGD) WITH PROPOFOL;  Surgeon: Juanita Craver, MD;  Location: [facility] ENDOSCOPY;  Service: Endoscopy;   Laterality: N/A;   FEMUR FRACTURE SURGERY Right 03/2011; 09/03/2011   "had 2, 2 wk apart in 2012; broke it again 08/2011 & had OR"   FRACTURE SURGERY     I & D EXTREMITY  09/15/2011   Procedure: IRRIGATION AND DEBRIDEMENT EXTREMITY w REMOVAL OF HARDWARE;  Surgeon: [name], MD;  Location: [place];  Service: Orthopedics;  Laterality: Right;   INCISION AND DRAINAGE HIP  12/21/2011   Procedure: IRRIGATION AND DEBRIDEMENT HIP;  Surgeon: [name], MD;  Location: [place];  Service: Orthopedics;  Laterality: Right;  I&D RIGHT HIP WITH PLACEMENT ANTIBIOTIC SPACER   INSERTION OF DIALYSIS CATHETER     Procedure: INSERTION OF DIALYSIS CATHETER;  Surgeon: Rosetta Posner, MD;  Location: [facility]  OR;  Service: Vascular;  Laterality: Right;   IRRIGATION AND DEBRIDEMENT ABSCESS  12/21/11   right hip   JOINT REPLACEMENT     KIDNEY TRANSPLANT  07/19/15   LAPAROSCOPIC GASTRIC BANDING  2008   LEFT ATRIAL APPENDAGE OCCLUSION  10/04/2017   OPEN REDUCTION INTERNAL FIXATION (ORIF) DISTAL RADIAL FRACTURE Right 10/18/2016   Procedure: OPEN REDUCTION INTERNAL FIXATION (ORIF) RIGHT DISTAL RADIAL FRACTURE;  Surgeon: Marchia Bond, MD;  Location: [place];  Service: Orthopedics;  Laterality: Right;   TOTAL HIP ARTHROPLASTY Right 02/2011   right THA 02/2011, I&D/removal of hardware 09/2011,, repeat I&D Jun 2013, reimplantation R THA 03-26-2012   TOTAL HIP REVISION  03/25/2012   Procedure: TOTAL HIP REVISION;  Surgeon: [name], MD;  Location: [facility] ORS;  Service: Orthopedics;  Laterality: Right;  Right Total Hip Reimplantation   TUBAL LIGATION     VENA CAVA FILTER PLACEMENT  11/2011    Prior to Admission medications   Medication Sig Start Date End Date Taking? Authorizing Provider  acetaminophen (TYLENOL) 500 MG tablet Take 500 mg by mouth every 6 (six) hours as needed for mild pain or headache.    Yes [provider]  albuterol (PROVENTIL) (2.5 MG/3ML) 0.083% nebulizer solution Take 3 mLs (2.5 mg total) by  nebulization every 6 (six) hours as needed for wheezing or shortness of breath. Dx:J45.909 10/25/19  Yes Rai, Ripudeep K, MD  amLODipine (NORVASC) 2.5 MG tablet Take 2.5 mg by mouth daily. 01/25/21  Yes [provider]  aspirin EC 81 MG tablet Take 81 mg by mouth daily. Swallow whole.   Yes [provider]  budesonide-formoterol (SYMBICORT) 160-4.5 MCG/ACT inhaler Inhale 2 puffs into the lungs 2 (two) times daily.   Yes [provider]  calcitRIOL (ROCALTROL) 0.25 MCG capsule Take 0.25 mcg by mouth daily. 12/13/19  Yes [provider]  carvedilol (COREG) 12.5 MG tablet Take 2 tablets (25 mg total) by mouth 2 (two) times daily. 11/18/19  Yes Aline August, MD  docusate sodium (COLACE) 100 MG capsule Take 100 mg by mouth daily.   Yes [provider]  DULoxetine (CYMBALTA) 30 MG capsule TAKE 1 CAPSULE BY MOUTH EVERY DAY Patient taking differently: Take 30 mg by mouth daily. 11/03/20  Yes [name], [name], NP  Ferrous Sulfate (IRON PO) Take 325 mg by mouth daily.    Yes [provider]  furosemide (LASIX) 80 MG tablet Take 120 mg by mouth 2 (two) times daily.   Yes [provider]  mycophenolate (MYFORTIC) 180 MG EC tablet Take 180 mg by mouth 2 (two) times daily.   Yes [provider]  ondansetron (ZOFRAN) 4 MG tablet Take 1 tablet (4 mg total) by mouth every 6 (six) hours as needed for nausea. 11/18/19  Yes Aline August, MD  potassium chloride (KLOR-CON) 10 MEQ tablet Take 10 mEq by mouth daily. 11/24/20  Yes [provider]  predniSONE (DELTASONE) 5 MG tablet Take 5 mg by mouth daily. 02/21/21  Yes [provider]  tacrolimus (PROGRAF) 1 MG capsule Take 1-2 mg by mouth See admin instructions. 2 mg in the morning 1 mg at bedtime   Yes [provider]  VENTOLIN HFA 108 (90 Base) MCG/ACT inhaler Inhale 2 puffs into the lungs 2 (two) times daily as needed. 10/21/19  Yes [provider]  furosemide  (LASIX) 40 MG tablet Take 1 tablet (40 mg total) by mouth 2 (two) times daily. Patient not taking: No sig reported 01/10/21  Haydee Salter, MD  HYDROcodone bit-homatropine [facility]) 5-1.5 MG/5ML syrup Take 5 mLs by mouth every 8 (eight) hours as needed for cough. Patient not taking: No sig reported 01/10/21   Haydee Salter, MD    Current Facility-Administered Medications  Medication Dose Route Frequency Provider Last Rate Last Admin   0.9 %  sodium chloride infusion   Intravenous Continuous Lenore Cordia, MD 100 mL/hr at 03/19/21 0219 New Bag at 03/19/21 0219   acetaminophen (TYLENOL) tablet 650 mg  650 mg Oral Q6H PRN Lenore Cordia, MD   650 mg at 03/19/21 9622   Or   acetaminophen (TYLENOL) suppository 650 mg  650 mg Rectal Q6H PRN Lenore Cordia, MD       albuterol (PROVENTIL) (2.5 MG/3ML) 0.083% nebulizer solution 2.5 mg  2.5 mg Nebulization Q6H PRN Lenore Cordia, MD       DULoxetine (CYMBALTA) DR capsule 30 mg  30 mg Oral Daily Lenore Cordia, MD       mometasone-formoterol (DULERA) 200-5 MCG/ACT inhaler 2 puff  2 puff Inhalation BID Lenore Cordia, MD   2 puff at 03/19/21 0809   mycophenolate (MYFORTIC) EC tablet 180 mg  180 mg Oral BID Lenore Cordia, MD       ondansetron [facility]) tablet 4 mg  4 mg Oral Q6H PRN Lenore Cordia, MD       Or   ondansetron (ZOFRAN) injection 4 mg  4 mg Intravenous Q6H PRN Lenore Cordia, MD       pantoprazole (PROTONIX) injection 40 mg  40 mg Intravenous Q12H Lenore Cordia, MD       predniSONE (DELTASONE) tablet 5 mg  5 mg Oral Daily Zada Finders R, MD       sodium chloride flush (NS) 0.9 % injection 3 mL  3 mL Intravenous Q12H Zada Finders R, MD       tacrolimus (PROGRAF) capsule 1 mg  1 mg Oral QHS Zada Finders R, MD   1 mg at 03/19/21 0201   tacrolimus (PROGRAF) capsule 2 mg  2 mg Oral Daily Lenore Cordia, MD        Allergies as of 03/18/2021 - Review Complete 03/18/2021  Allergen Reaction Noted   Ace inhibitors Other (See  Comments) 03/28/2012   Amiodarone Other (See Comments) 12/25/2017   Amlodipine  03/15/2020   Warfarin Other (See Comments) 04/07/2013   Warfarin sodium Other (See Comments) 06/06/2011    Family History  Problem Relation Age of Onset   Cancer Father    Diabetes Mother    Hypertension Mother    Arthritis Mother    Hodgkin's lymphoma Other 62       dscd---HODGKINS DISEASE   Hypertension Brother     Social History   Socioeconomic History   Marital status: Married    Spouse name: Not on file   Number of children: Not on file   Years of education: Not on file   Highest education level: Not on file  Occupational History   Occupation: retired  Tobacco Use   Smoking status: Never   Smokeless tobacco: Never  Vaping Use   Vaping Use: Never used  Substance and Sexual Activity   Alcohol use: Never   Drug use: Never   Sexual activity: Yes    Birth control/protection: Post-menopausal  Other Topics Concern   Not on file  Social History Narrative   Married; retired; lives in [place]  Financial Resource Strain: Not on file  Food Insecurity: No Food Insecurity   Worried About Charity fundraiser in the Last Year: Never true   Arboriculturist in the Last Year: Never true  Transportation Needs: Not on file  Physical Activity: Insufficiently Active   Days of Exercise per Week: 2 days   Minutes of Exercise per Session: 60 min  Stress: Stress Concern Present   Feeling of Stress : To some extent  Social Connections: Engineer, building services of Communication with Friends and Family: More than three times a week   Frequency of Social Gatherings with Friends and Family: Once a week   Attends Religious Services: 1 to 4 times per year   Active Member of [employer] or Organizations: Yes   Attends Archivist Meetings: 1 to 4 times per year   Marital Status: Married  Human resources officer Violence: Not At Risk   Fear of Current or Ex-Partner:  No   Emotionally Abused: No   Physically Abused: No   Sexually Abused: No   Review of Systems: ROS is O/W negative except as mentioned in HPI.  Physical Exam: Vital signs in last 24 hours: Temp:  [97.7 F (36.5 C)-98.7 F (37.1 C)] 97.8 F (36.6 C) (09/04 0758) Pulse Rate:  [69-72] 70 (09/04 0758) Resp:  [16-26] 20 (09/04 0758) BP: (78-108)/(53-72) 99/56 (09/04 0758) SpO2:  [98 %-100 %] 100 % (09/04 0813) Weight:  [60.3 kg] 60.3 kg (09/04 0500) Last BM Date: 03/18/21 General:  Alert, Well-developed, well-nourished, pleasant and cooperative in NAD Head:  Normocephalic and atraumatic. Eyes:  Sclera clear, no icterus.  Conjunctiva pink. Ears:  Normal auditory acuity. Mouth:  No deformity or lesions.   Lungs:  Clear throughout to auscultation.  No wheezes, crackles, or rhonchi.  Heart:  Regular rate and rhythm; no murmurs, clicks, rubs, or gallops. Abdomen:  Soft, non-distended.  BS present.  Non-tender. Msk:  Symmetrical without gross deformities. Pulses:  Normal pulses noted. Extremities:  Without clubbing or edema. Neurologic:  Alert and oriented x 4;  grossly normal neurologically. Skin:  Intact without significant lesions or rashes. Psych:  Alert and cooperative. Normal mood and affect.  Intake/Output from previous day: 09/03 0701 - 09/04 0700 In: 315 [Blood:315] Out: -   Lab Results: Recent Labs    03/18/21 2036 03/18/21 2242 03/19/21 0808  WBC 24.9*  --   --   HGB 7.1* 8.2* 7.2*  HCT 22.6* 24.0* 22.0*  PLT 511*  --   --    BMET Recent Labs    03/18/21 2036 03/18/21 2242  NA 133* 134*  K 5.4* 5.4*  CL 100 103  CO2 23  --   GLUCOSE 143* 128*  BUN 111* 112*  CREATININE 4.97* 5.10*  CALCIUM 8.6*  --    LFT Recent Labs    03/18/21 2036  PROT 5.3*  ALBUMIN 2.4*  AST 10*  ALT 10  ALKPHOS 87  BILITOT 0.5   PT/INR Recent Labs    03/18/21 2036  LABPROT 15.0  INR 1.2   IMPRESSION:  *GIB:  Has history of diverticular bleed in 11/2019.  Easy to  want to say that this episode is diverticular as well, but she reports that it is very dark maroon/burgundy colored stool intermittently over the course of a couple of weeks, which is not typical of diverticular bleeding.  ? UGI source on this occasion. *ABLA:  Hgb 7.1 grams down from 13.1 grams in 02/2020.  S/p  one unit PRBCs and went up to 8.2 grams and then back down to 7.2 grams. *Renal transplant/chronic immunosuppression:  Transplant 2017. *Atrial fibrillation s/p Watchman procedure and PPM-not on anticoagulation except ASA 81 mg daily.  PLAN: -Monitor hgb and transfuse further prn. -Pantoprazole 40 mg IV BID is adequate, continue for now.  Laban Emperor. Zehr  03/19/2021, 8:53 AM   ________________________________________________________________________  Velora Heckler GI MD note:  I personally examined the patient, reviewed the data and agree with the assessment and plan described above.  I obtained a bit of a different history from her and her husband today.  She's had intermittently dark stools for 2-3 weeks, shortly after she resumed oral iron supplements. She had a large amount of painless BRBPR yesterday around 2pm, I saw a picture on her husbands phone.  I suspect she's had recurrent diverticular bleeding (yesterday) and the dark stools were from iron supplement.  She's had LGI bleeding about a year ago attributed to diverticulosis, underwent colonoscopy and EGD at that time. Those tests will not need to be repeated now as long as she does not prove to have signficant recurrent bleeding.  I am allowing clears today, will recheck cbc in the am.  I tihnk once daily oral PPI is safe for now, my suspicion for upper GI bleeding is relatively low.  Will follow along.   Owens Loffler, MD [facility] Gastroenterology Pager [phone]

## 2021-03-20 ENCOUNTER — Encounter (HOSPITAL_COMMUNITY)
Admission: EM | Disposition: A | Discharge status: Home Health | Payer: Self-pay | Source: Home / Self Care | Attending: Family Medicine

## 2021-03-20 ENCOUNTER — Encounter (HOSPITAL_COMMUNITY): Payer: Self-pay | Admitting: Internal Medicine

## 2021-03-20 ENCOUNTER — Inpatient Hospital Stay (HOSPITAL_COMMUNITY): Payer: Medicare Other | Admitting: Certified Registered"

## 2021-03-20 DIAGNOSIS — K921 Melena: Secondary | ICD-10-CM

## 2021-03-20 DIAGNOSIS — K264 Chronic or unspecified duodenal ulcer with hemorrhage: Principal | ICD-10-CM

## 2021-03-20 HISTORY — PX: HOT HEMOSTASIS: SHX5433

## 2021-03-20 HISTORY — PX: ESOPHAGOGASTRODUODENOSCOPY (EGD) WITH PROPOFOL: SHX5813

## 2021-03-20 HISTORY — PX: HEMOSTASIS CONTROL: SHX6838

## 2021-03-20 LAB — COMPREHENSIVE METABOLIC PANEL
ALT: 9 U/L (ref 0–44)
AST: 11 U/L — ABNORMAL LOW (ref 15–41)
Albumin: 2 g/dL — ABNORMAL LOW (ref 3.5–5.0)
Alkaline Phosphatase: 49 U/L (ref 38–126)
Anion gap: 10 (ref 5–15)
BUN: 99 mg/dL — ABNORMAL HIGH (ref 8–23)
CO2: 22 mmol/L (ref 22–32)
Calcium: 8.3 mg/dL — ABNORMAL LOW (ref 8.9–10.3)
Chloride: 107 mmol/L (ref 98–111)
Creatinine, Ser: 4.21 mg/dL — ABNORMAL HIGH (ref 0.44–1.00)
GFR, Estimated: 11 mL/min — ABNORMAL LOW (ref >60)
Glucose, Bld: 102 mg/dL — ABNORMAL HIGH (ref 70–99)
Potassium: 4.8 mmol/L (ref 3.5–5.1)
Sodium: 139 mmol/L (ref 135–145)
Total Bilirubin: 0.8 mg/dL (ref 0.3–1.2)
Total Protein: 4.3 g/dL — ABNORMAL LOW (ref 6.5–8.1)

## 2021-03-20 LAB — CBC
HCT: 23.3 % — ABNORMAL LOW (ref 36.0–46.0)
HCT: 29.2 % — ABNORMAL LOW (ref 36.0–46.0)
Hemoglobin: 7.6 g/dL — ABNORMAL LOW (ref 12.0–15.0)
Hemoglobin: 9.9 g/dL — ABNORMAL LOW (ref 12.0–15.0)
MCH: 28.8 pg (ref 26.0–34.0)
MCH: 30.6 pg (ref 26.0–34.0)
MCHC: 32.6 g/dL (ref 30.0–36.0)
MCHC: 33.9 g/dL (ref 30.0–36.0)
MCV: 88.3 fL (ref 80.0–100.0)
MCV: 90.1 fL (ref 80.0–100.0)
Platelets: 330 10*3/uL (ref 150–400)
Platelets: 296 10*3/uL (ref 150–400)
RBC: 2.64 MIL/uL — ABNORMAL LOW (ref 3.87–5.11)
RBC: 3.24 MIL/uL — ABNORMAL LOW (ref 3.87–5.11)
RDW: 16.3 % — ABNORMAL HIGH (ref 11.5–15.5)
RDW: 15.8 % — ABNORMAL HIGH (ref 11.5–15.5)
WBC: 17 10*3/uL — ABNORMAL HIGH (ref 4.0–10.5)
WBC: 24.2 10*3/uL — ABNORMAL HIGH (ref 4.0–10.5)
nRBC: 0 % (ref 0.0–0.2)
nRBC: 0.2 % (ref 0.0–0.2)

## 2021-03-20 LAB — PREPARE RBC (CROSSMATCH)

## 2021-03-20 LAB — HEMOGLOBIN AND HEMATOCRIT, BLOOD
HCT: 18.9 % — ABNORMAL LOW (ref 36.0–46.0)
Hemoglobin: 6.3 g/dL — CL (ref 12.0–15.0)

## 2021-03-20 SURGERY — ESOPHAGOGASTRODUODENOSCOPY (EGD) WITH PROPOFOL
Anesthesia: Monitor Anesthesia Care

## 2021-03-20 MED ORDER — PANTOPRAZOLE SODIUM 40 MG IV SOLR
40.0000 mg | Freq: Two times a day (BID) | INTRAVENOUS | Status: DC
Start: 2021-03-23 — End: 2021-03-25
  Administered 2021-03-23 – 2021-03-25 (×4): 40 mg via INTRAVENOUS
  Filled 2021-03-20 (×4): qty 40

## 2021-03-20 MED ORDER — ACETAMINOPHEN 325 MG PO TABS
650.0000 mg | ORAL_TABLET | Freq: Once | ORAL | Status: DC
  Filled 2021-03-20: qty 2

## 2021-03-20 MED ORDER — PROPOFOL 500 MG/50ML IV EMUL
INTRAVENOUS | Status: DC | PRN
  Administered 2021-03-20: 100 ug/kg/min via INTRAVENOUS

## 2021-03-20 MED ORDER — PROPOFOL 10 MG/ML IV BOLUS
INTRAVENOUS | Status: DC | PRN
  Administered 2021-03-20 (×2): 10 mg via INTRAVENOUS
  Administered 2021-03-20: 20 mg via INTRAVENOUS

## 2021-03-20 MED ORDER — SODIUM CHLORIDE 0.9 % IV SOLN: INTRAVENOUS | Status: DC

## 2021-03-20 MED ORDER — PANTOPRAZOLE 80MG IVPB - SIMPLE MED
80.0000 mg | Freq: Once | INTRAVENOUS | Status: AC
  Administered 2021-03-20: 80 mg via INTRAVENOUS
  Filled 2021-03-20: qty 80

## 2021-03-20 MED ORDER — PANTOPRAZOLE INFUSION (NEW) - SIMPLE MED
8.0000 mg/h | INTRAVENOUS | Status: AC
  Administered 2021-03-20 – 2021-03-23 (×7): 8 mg/h via INTRAVENOUS
  Filled 2021-03-20 (×2): qty 100
  Filled 2021-03-20 (×3): qty 80
  Filled 2021-03-20: qty 100
  Filled 2021-03-20: qty 80
  Filled 2021-03-20: qty 100

## 2021-03-20 MED ORDER — SODIUM CHLORIDE (PF) 0.9 % IJ SOLN
PREFILLED_SYRINGE | INTRAMUSCULAR | Status: DC | PRN
  Administered 2021-03-20: 1.5 mL

## 2021-03-20 MED ORDER — FUROSEMIDE 10 MG/ML IJ SOLN
20.0000 mg | Freq: Once | INTRAMUSCULAR | Status: AC
  Administered 2021-03-20: 20 mg via INTRAVENOUS
  Filled 2021-03-20 (×2): qty 2

## 2021-03-20 MED ORDER — SODIUM CHLORIDE 0.9% IV SOLUTION: Freq: Once | INTRAVENOUS | Status: DC

## 2021-03-20 MED ORDER — EPINEPHRINE 1 MG/10ML IJ SOSY
PREFILLED_SYRINGE | INTRAMUSCULAR | Status: AC
  Filled 2021-03-20: qty 20

## 2021-03-20 MED ORDER — DIPHENHYDRAMINE HCL 25 MG PO CAPS
25.0000 mg | ORAL_CAPSULE | Freq: Once | ORAL | Status: AC
  Administered 2021-03-20: 25 mg via ORAL
  Filled 2021-03-20: qty 1

## 2021-03-20 SURGICAL SUPPLY — 15 items

## 2021-03-20 NOTE — Progress Notes (Signed)
Pt had 4 BM during shift and it was all large, bloody with clots. Pt hemoglobin up to 7.6 after receiving 2 units of RBC. NP on call paged of hgb but no response yet. Will continue to closely monitor pt. Delia Heady RN

## 2021-03-20 NOTE — Anesthesia Postprocedure Evaluation (Signed)
Anesthesia Post Note  Patient: [name]  Procedure(s) Performed: ESOPHAGOGASTRODUODENOSCOPY (EGD) WITH PROPOFOL HOT HEMOSTASIS (ARGON PLASMA COAGULATION/BICAP) HEMOSTASIS CONTROL     Patient location during evaluation: Endoscopy Anesthesia Type: MAC Level of consciousness: awake and alert Pain management: pain level controlled Vital Signs Assessment: post-procedure vital signs reviewed and stable Respiratory status: spontaneous breathing, nonlabored ventilation, respiratory function stable and patient connected to nasal cannula oxygen Cardiovascular status: stable and blood pressure returned to baseline Postop Assessment: no apparent nausea or vomiting Anesthetic complications: no   No notable events documented.  Last Vitals:  Vitals:   03/20/21 1350 03/20/21 1614  BP: (!) 141/70 137/65  Pulse: 70 70  Resp: 20 (!) 23  Temp: (!) 35.9 C 37.1 C  SpO2: 99% 99%    Last Pain:  Vitals:   03/20/21 1614  TempSrc: Axillary  PainSc:                  [name]

## 2021-03-20 NOTE — Anesthesia Preprocedure Evaluation (Signed)
Anesthesia Evaluation  Patient identified by MRN, date of birth, ID band Patient awake    Reviewed: Allergy & Precautions, NPO status , Patient's Chart, lab work & pertinent test results  History of Anesthesia Complications Negative for: history of anesthetic complications  Airway Mallampati: II  TM Distance: >3 FB Neck ROM: Full    Dental  (+) Dental Advisory Given, Teeth Intact   Pulmonary shortness of breath, asthma , sleep apnea and Continuous Positive Airway Pressure Ventilation ,    breath sounds clear to auscultation       Cardiovascular hypertension, Pt. on medications + Peripheral Vascular Disease and +CHF   Rhythm:Regular  1. Left ventricular ejection fraction, by estimation, is 55 to 60%. The  left ventricle has normal function. The left ventricle has no regional  wall motion abnormalities. There is mild concentric left ventricular  hypertrophy. Left ventricular diastolic  function could not be evaluated. There is the interventricular septum is  flattened in diastole ('D' shaped left ventricle), consistent with right  ventricular volume overload.  2. Right ventricular systolic function is low normal. The right  ventricular size is moderately enlarged. There is moderately elevated  pulmonary artery systolic pressure. The estimated right ventricular  systolic pressure is 55.9 mmHg.  3. Left atrial size was severely dilated.  4. Right atrial size was severely dilated.  5. The mitral valve is normal in structure. Mild to moderate mitral valve  regurgitation. No evidence of mitral stenosis.  6. 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  7. The aortic valve is normal in structure. Aortic valve regurgitation is  mild. No aortic stenosis is present.  8. The inferior vena cava is dilated in size with <50% respiratory  variability, suggesting right atrial pressure of 15 mmHg.     Neuro/Psych PSYCHIATRIC DISORDERS Anxiety Depression CVA, No Residual Symptoms    GI/Hepatic GERD  ,? Gi bleed   Endo/Other    Renal/GU Renal diseasePrevious esrd with hd s/p kidney transplant, ARF on labs     Musculoskeletal  (+) Arthritis ,   Abdominal   Peds  Hematology  (+) Blood dyscrasia, anemia , Lab Results      Component                Value               Date                      WBC                      17.0 (H)            03/20/2021                HGB                      6.3 (LL)            03/20/2021                HCT                      18.9 (L)            03/20/2021                MCV  88.3                03/20/2021                PLT                      330                 03/20/2021              Anesthesia Other Findings   Reproductive/Obstetrics                             Anesthesia Physical Anesthesia Plan  ASA: 3  Anesthesia Plan: MAC   Post-op Pain Management:    Induction: Intravenous  PONV Risk Score and Plan: 2 and Propofol infusion and Treatment may vary due to age or medical condition  Airway Management Planned: Nasal Cannula  Additional Equipment: None  Intra-op Plan:   Post-operative Plan:   Informed Consent: I have reviewed the patients History and Physical, chart, labs and discussed the procedure including the risks, benefits and alternatives for the proposed anesthesia with the patient or authorized representative who has indicated his/her understanding and acceptance.     Dental advisory given  Plan Discussed with: CRNA and Anesthesiologist  Anesthesia Plan Comments:         Anesthesia Quick Evaluation

## 2021-03-20 NOTE — H&P (View-Only) (Signed)
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 Collene Mares HF PEF per echo 10/22/2020 , on 25-05% mild diastolic heart failure, right ventricular overload previously OSA  1 week history of prior to admission frequent stool dark red blood nausea scant nonbloody emesis--noted dark black stools 9/3 and syncopized at home  Brought to ED and syncopized again when trying to sit up WBC found to be 24 hemoglobin previously 13, on admission 7.1 platelet 511,  potassium 5.4 BUNs/creatinine 111/4.9  [prior creatinine 2.3]  Reviewed in the afternoon and felt to have some clots well passing stool Has required several transfusions this hospitalization-gastroenterology is closely involved and helping direct management   Hospital-Problem based course  Undifferentiated GI bleed, probably diverticular Keep n.p.o given further bleeding. hold aspirin --Protonix gtt. reinitiated 9/5 given bleeding Might require advanced therapies including CT angiogram of abdomen pelvis and may need to consult IR with regards to the same-I will defer this discussion to Dr. 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 1 mg p.m. and prednisone 5 Continue calcitriol when able to take p.o. 1 dose Lokelma given on admission and potassium is improved likely mediated by some ATN Not emergently will involve nephrology into the patient's care given she is on transplant meds Atrial fibrillation status post multiple procedures not on anticoagulation secondary to intracerebral hemorrhage in the past Keep monitors-Coreg 25 twice daily held and will resume if more hemodynamically stable HFpEF 55-60% previously Hold Lasix from prior to admission at this time Syncope on admission likely secondary to orthostasis from bleeding HTN Multiple meds have been held given risk for hypotension Resume meds once hemodynamically stable Depression Continue Cymbalta 30 daily    DVT prophylaxis: SCD Code Status: Full Family Communication: None Disposition:  Status is: Inpatient  Remains inpatient appropriate because:Hemodynamically unstable, Altered mental status, Ongoing diagnostic testing needed not appropriate for outpatient work up, and IV treatments appropriate due to intensity of illness or inability to take PO  Dispo: The patient is from: Home              Anticipated d/c is to: Home              Patient currently is not medically stable to d/c.   Difficult to place patient No   Consultants:  Gastroenterology  Procedures:   Antimicrobials:     Subjective:  Events noted overnight-required further transfusion had some bleeding in addition. She sitting in melanotic stool as I discussed with her as I smelled it while examining her-it is now frank dark blood with no consistency other than liquid Her husband has several concerns about how to manage this in the outpatient setting if this were to recur and I had a long discussion about  certain options with him  Nursing has been made aware of the bleeding we will get [name] that  hemoglobin and supported with transfusion-in addition have also reached out to Dr. Ardis Hughs to make him aware   Objective: Vitals:   03/20/21 0144 03/20/21 0146 03/20/21 0329 03/20/21 0807  BP: 114/61  111/61 90/85  Pulse: 70  70   Resp: (!) 23  20   Temp: 98.4 F (36.9 C)  98.4 F (36.9 C) 97.9 F (36.6 C)  TempSrc: Oral  Oral Oral  SpO2: 100%  99%   Weight:  65.1 kg    Height:        Intake/Output Summary (Last 24 hours) at 03/20/2021 0844 Last data filed at 03/20/2021 0502 Gross per 24 hour  Intake 1599.33 ml  Output --  Net 1599.33 ml    Filed Weights   03/18/21 2327 03/19/21 0500 03/20/21 0146  Weight: 60.3 kg 60.3 kg 65.1 kg    Examination:  Coherent awake slightly sleepy appearing at times S1-S2 no murmur Multiform PVCs at times A. fib on monitors Abdomen nontender increased bowel sounds no rebound no guarding No lower extremity edema  Neurologically intact no focal deficit    Data Reviewed: personally reviewed   CBC    Component Value Date/Time   WBC 17.0 (H) 03/20/2021 0358   RBC 2.64 (L) 03/20/2021 0358   HGB 7.6 (L) 03/20/2021 0358   HGB 10.8 (L) 11/27/2019 1616   HCT 23.3 (L) 03/20/2021 0358   HCT 33.8 (L) 11/27/2019 1616   PLT 330 03/20/2021 0358   PLT 284 11/27/2019 1616   MCV 88.3 03/20/2021 0358   MCV 88 11/27/2019 1616   MCH 28.8 03/20/2021 0358   MCHC 32.6 03/20/2021 0358   RDW 16.3 (H) 03/20/2021 0358   RDW 15.1 11/27/2019 1616   LYMPHSABS 0.3 (L) 03/19/2021 1600   LYMPHSABS 0.6 (L) 11/27/2019 1616   MONOABS 0.0 (L) 03/19/2021 1600   EOSABS 0.0 03/19/2021 1600   EOSABS 0.0 11/27/2019 1616   BASOSABS 0.2 (H) 03/19/2021 1600   BASOSABS 0.1 11/27/2019 1616   CMP Latest Ref Rng & Units 03/20/2021 03/19/2021 03/18/2021  Glucose 70 - 99 mg/dL 102(H) QUANTITY NOT SUFFICIENT, UNABLE TO PERFORM TEST 128(H)  BUN 8 - 23 mg/dL 99(H) QUANTITY NOT SUFFICIENT, UNABLE TO PERFORM TEST 112(H)  Creatinine 0.44 - 1.00 mg/dL 4.21(H) QUANTITY NOT SUFFICIENT,  UNABLE TO PERFORM TEST 5.10(H)  Sodium 135 - 145 mmol/L 139 QUANTITY NOT SUFFICIENT, UNABLE TO PERFORM TEST 134(L)  Potassium 3.5 - 5.1 mmol/L 4.8 QUANTITY NOT SUFFICIENT, UNABLE TO PERFORM TEST 5.4(H)  Chloride 98 - 111 mmol/L 107 QUANTITY NOT SUFFICIENT, UNABLE TO PERFORM TEST 103  CO2 22 - 32 mmol/L 22 QUANTITY NOT SUFFICIENT, UNABLE TO PERFORM TEST -  Calcium 8.9 - 10.3 mg/dL 8.3(L) QUANTITY NOT SUFFICIENT, UNABLE TO PERFORM TEST -  Total Protein 6.5 - 8.1 g/dL 4.3(L) - -  Total Bilirubin 0.3 - 1.2 mg/dL 0.8 - -  Alkaline Phos 38 - 126 U/L 49 - -  AST 15 - 41 U/L 11(L) - -  ALT 0 - 44 U/L 9 - -     Radiology Studies: No results found.   Scheduled Meds:  DULoxetine  30 mg Oral Daily   mometasone-formoterol  2 puff Inhalation BID   mycophenolate  180 mg Oral BID   [START ON 03/23/2021] pantoprazole  40 mg Intravenous Q12H   predniSONE  5 mg Oral Daily   sodium chloride flush  3 mL  Intravenous Q12H   tacrolimus  1 mg Oral QHS   tacrolimus  2 mg Oral Daily   Continuous Infusions:  sodium chloride 40 mL/hr at 03/20/21 0502   pantoprazole     pantoprazole       LOS: 2 days   Time spent: [place], MD [facility] To contact the attending provider between 7A-7P or the covering provider during after hours 7P-7A, please log into the web site [email or web address] and access using universal [place] password for that web site. If you do not have the password, please call the hospital operator.  03/20/2021, 8:44 AM

## 2021-03-20 NOTE — Progress Notes (Signed)
[facility] Progress Note Covering Drs. Collene Mares and [place] this weekend   Since last GI note: Continues to have red rectal bleeding with clots, 3-4 episodes overnight.  Has been HD stable and has not had any abd pain, nausea or vomiting. All this was confirmed with RN and CNA because patient is a bit confused at times.  Objective: Vital signs in last 24 hours: Temp:  [97.6 F (36.4 C)-98.7 F (37.1 C)] 98.4 F (36.9 C) (09/05 0329) Pulse Rate:  [70-74] 70 (09/05 0329) Resp:  [12-23] 20 (09/05 0329) BP: (95-117)/(55-67) 111/61 (09/05 0329) SpO2:  [98 %-100 %] 99 % (09/05 0329) Weight:  [65.1 kg] 65.1 kg (09/05 0146) Last BM Date: 03/19/21 General: alert and oriented times 3 Heart: regular rate and rythm Abdomen: soft, non-tender, non-distended, normal bowel sounds  Lab Results: Recent Labs    03/19/21 0800 03/19/21 0808 03/19/21 1600 03/20/21 0358  WBC 17.9*  --  17.1* 17.0*  HGB 7.5* 7.2* 6.9* 7.6*  PLT 209  --  396 330  MCV 81.2  --  86.2 88.3   Recent Labs    03/18/21 2036 03/18/21 2242 03/19/21 0800 03/20/21 0359  NA 133* 134* QUANTITY NOT SUFFICIENT, UNABLE TO PERFORM TEST 139  K 5.4* 5.4* QUANTITY NOT SUFFICIENT, UNABLE TO PERFORM TEST 4.8  CL 100 103 QUANTITY NOT SUFFICIENT, UNABLE TO PERFORM TEST 107  CO2 23  --  QUANTITY NOT SUFFICIENT, UNABLE TO PERFORM TEST 22  GLUCOSE 143* 128* QUANTITY NOT SUFFICIENT, UNABLE TO PERFORM TEST 102*  BUN 111* 112* QUANTITY NOT SUFFICIENT, UNABLE TO PERFORM TEST 99*  CREATININE 4.97* 5.10* QUANTITY NOT SUFFICIENT, UNABLE TO PERFORM TEST 4.21*  CALCIUM 8.6*  --  QUANTITY NOT SUFFICIENT, UNABLE TO PERFORM TEST 8.3*   Recent Labs    03/18/21 2036 03/20/21 0359  PROT 5.3* 4.3*  ALBUMIN 2.4* 2.0*  AST 10* 11*  ALT 10 9  ALKPHOS 87 49  BILITOT 0.5 0.8   Recent Labs    03/18/21 2036  INR 1.2    Medications: Scheduled Meds:  DULoxetine  30 mg Oral Daily   mometasone-formoterol  2 puff Inhalation BID    mycophenolate  180 mg Oral BID   pantoprazole  40 mg Oral Q0600   predniSONE  5 mg Oral Daily   sodium chloride flush  3 mL Intravenous Q12H   tacrolimus  1 mg Oral QHS   tacrolimus  2 mg Oral Daily   Continuous Infusions:  sodium chloride 40 mL/hr at 03/20/21 0502   PRN Meds:.acetaminophen **OR** acetaminophen, albuterol, ondansetron **OR** ondansetron (ZOFRAN) IV  Assessment/Plan: 75 y.o. female with GI bleeding, likely lower, colon diverticular  Given her HD stability I think a brisk upper bleed is quite a bit less likely.  She has received 3 units of PRBC this admission so far and Hb has remained essentially unchanged from admitting 7.1 to 7.6 today.  Colonsocopy and EGD one year ago (diverticulosis throughout colon, erosions in stomach) during similar admission probably do not need to be repeated now.  Lower GI bleeding like this usually stops without need for intervention.  I favor giving her another 24 hours close observation prior to more aggressive care.  I recommend following her blood counts every 8 hours, transfusing as needed to keep Hb above 7.  Clear liquids only.  If she becomes unstable then will have to consider IR attempt (probably preceded by CT angio bleed protocol) vs. Prepping and repeating a colonoscopy.  [facility] is available unit  8am tomorrow for any questions or concers, Drs. Mann/Hung will be assuming her GI care after that.  Milus Banister, MD  03/20/2021, 6:22 AM [facility] Pager [phone]

## 2021-03-20 NOTE — Progress Notes (Addendum)
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 Collene Mares HF PEF per echo 10/22/2020 , on 52-84% mild diastolic heart failure, right ventricular overload previously OSA  1 week history of prior to admission frequent stool dark red blood nausea scant nonbloody emesis--noted dark black stools 9/3 and syncopized at home  Brought to ED and syncopized again when trying to sit up WBC found to be 24 hemoglobin previously 13, on admission 7.1 platelet 511,  potassium 5.4 BUNs/creatinine 111/4.9  [prior creatinine 2.3]  Reviewed in the afternoon and felt to have some clots well passing stool Has required several transfusions this hospitalization-gastroenterology is closely involved and helping direct management   Hospital-Problem based course  Undifferentiated GI bleed, probably diverticular Keep n.p.o given further bleeding. hold aspirin --Protonix gtt. reinitiated 9/5 given bleeding Might require advanced therapies including CT angiogram of abdomen pelvis and may need to consult IR with regards to the same-I will defer this discussion to Dr. 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 1 mg p.m. and prednisone 5 Continue calcitriol when able to take p.o. 1 dose Lokelma given on admission and potassium is improved likely mediated by some ATN Not emergently will involve nephrology into the patient's care given she is on transplant meds Atrial fibrillation status post multiple procedures not on anticoagulation secondary to intracerebral hemorrhage in the past Keep monitors-Coreg 25 twice daily held and will resume if more hemodynamically stable HFpEF 55-60% previously Hold Lasix from prior to admission at this time Syncope on admission likely secondary to orthostasis from bleeding HTN Multiple meds have been held given risk for hypotension Resume meds once hemodynamically stable Depression Continue Cymbalta 30 daily    DVT prophylaxis: SCD Code Status: Full Family Communication: None Disposition:  Status is: Inpatient  Remains inpatient appropriate because:Hemodynamically unstable, Altered mental status, Ongoing diagnostic testing needed not appropriate for outpatient work up, and IV treatments appropriate due to intensity of illness or inability to take PO  Dispo: The patient is from: Home              Anticipated d/c is to: Home              Patient currently is not medically stable to d/c.   Difficult to place patient No   Consultants:  Gastroenterology  Procedures:   Antimicrobials:     Subjective:  Events noted overnight-required further transfusion had some bleeding in addition. She sitting in melanotic stool as I discussed with her as I smelled it while examining her-it is now frank dark blood with no consistency other than liquid Her husband has several concerns about how to manage this in the outpatient setting if this were to recur and I had a long discussion about  certain options with him  Nursing has been made aware of the bleeding we will get [name] that  hemoglobin and supported with transfusion-in addition have also reached out to Dr. Ardis Hughs to make him aware   Objective: Vitals:   03/20/21 0144 03/20/21 0146 03/20/21 0329 03/20/21 0807  BP: 114/61  111/61 90/85  Pulse: 70  70   Resp: (!) 23  20   Temp: 98.4 F (36.9 C)  98.4 F (36.9 C) 97.9 F (36.6 C)  TempSrc: Oral  Oral Oral  SpO2: 100%  99%   Weight:  65.1 kg    Height:        Intake/Output Summary (Last 24 hours) at 03/20/2021 0844 Last data filed at 03/20/2021 0502 Gross per 24 hour  Intake 1599.33 ml  Output --  Net 1599.33 ml    Filed Weights   03/18/21 2327 03/19/21 0500 03/20/21 0146  Weight: 60.3 kg 60.3 kg 65.1 kg    Examination:  Coherent awake slightly sleepy appearing at times S1-S2 no murmur Multiform PVCs at times A. fib on monitors Abdomen nontender increased bowel sounds no rebound no guarding No lower extremity edema  Neurologically intact no focal deficit    Data Reviewed: personally reviewed   CBC    Component Value Date/Time   WBC 17.0 (H) 03/20/2021 0358   RBC 2.64 (L) 03/20/2021 0358   HGB 7.6 (L) 03/20/2021 0358   HGB 10.8 (L) 11/27/2019 1616   HCT 23.3 (L) 03/20/2021 0358   HCT 33.8 (L) 11/27/2019 1616   PLT 330 03/20/2021 0358   PLT 284 11/27/2019 1616   MCV 88.3 03/20/2021 0358   MCV 88 11/27/2019 1616   MCH 28.8 03/20/2021 0358   MCHC 32.6 03/20/2021 0358   RDW 16.3 (H) 03/20/2021 0358   RDW 15.1 11/27/2019 1616   LYMPHSABS 0.3 (L) 03/19/2021 1600   LYMPHSABS 0.6 (L) 11/27/2019 1616   MONOABS 0.0 (L) 03/19/2021 1600   EOSABS 0.0 03/19/2021 1600   EOSABS 0.0 11/27/2019 1616   BASOSABS 0.2 (H) 03/19/2021 1600   BASOSABS 0.1 11/27/2019 1616   CMP Latest Ref Rng & Units 03/20/2021 03/19/2021 03/18/2021  Glucose 70 - 99 mg/dL 102(H) QUANTITY NOT SUFFICIENT, UNABLE TO PERFORM TEST 128(H)  BUN 8 - 23 mg/dL 99(H) QUANTITY NOT SUFFICIENT, UNABLE TO PERFORM TEST 112(H)  Creatinine 0.44 - 1.00 mg/dL 4.21(H) QUANTITY NOT SUFFICIENT,  UNABLE TO PERFORM TEST 5.10(H)  Sodium 135 - 145 mmol/L 139 QUANTITY NOT SUFFICIENT, UNABLE TO PERFORM TEST 134(L)  Potassium 3.5 - 5.1 mmol/L 4.8 QUANTITY NOT SUFFICIENT, UNABLE TO PERFORM TEST 5.4(H)  Chloride 98 - 111 mmol/L 107 QUANTITY NOT SUFFICIENT, UNABLE TO PERFORM TEST 103  CO2 22 - 32 mmol/L 22 QUANTITY NOT SUFFICIENT, UNABLE TO PERFORM TEST -  Calcium 8.9 - 10.3 mg/dL 8.3(L) QUANTITY NOT SUFFICIENT, UNABLE TO PERFORM TEST -  Total Protein 6.5 - 8.1 g/dL 4.3(L) - -  Total Bilirubin 0.3 - 1.2 mg/dL 0.8 - -  Alkaline Phos 38 - 126 U/L 49 - -  AST 15 - 41 U/L 11(L) - -  ALT 0 - 44 U/L 9 - -     Radiology Studies: No results found.   Scheduled Meds:  DULoxetine  30 mg Oral Daily   mometasone-formoterol  2 puff Inhalation BID   mycophenolate  180 mg Oral BID   [START ON 03/23/2021] pantoprazole  40 mg Intravenous Q12H   predniSONE  5 mg Oral Daily   sodium chloride flush  3 mL  Intravenous Q12H   tacrolimus  1 mg Oral QHS   tacrolimus  2 mg Oral Daily   Continuous Infusions:  sodium chloride 40 mL/hr at 03/20/21 0502   pantoprazole     pantoprazole       LOS: 2 days   Time spent: [place], MD [facility] To contact the attending provider between 7A-7P or the covering provider during after hours 7P-7A, please log into the web site [email or web address] and access using universal [place] password for that web site. If you do not have the password, please call the hospital operator.  03/20/2021, 8:44 AM

## 2021-03-20 NOTE — Transfer of Care (Signed)
Immediate Anesthesia Transfer of Care Note  Patient: [name]  Procedure(s) Performed: ESOPHAGOGASTRODUODENOSCOPY (EGD) WITH PROPOFOL HOT HEMOSTASIS (ARGON PLASMA COAGULATION/BICAP) HEMOSTASIS CONTROL  Patient Location: Endoscopy Unit  Anesthesia Type:MAC  Level of Consciousness: drowsy and patient cooperative  Airway & Oxygen Therapy: Patient Spontanous Breathing and Patient connected to nasal cannula oxygen  Post-op Assessment: Report given to RN, Post -op Vital signs reviewed and stable and Patient moving all extremities  Post vital signs: Reviewed and stable  Last Vitals:  Vitals Value Taken Time  BP    Temp    Pulse    Resp    SpO2      Last Pain:  Vitals:   03/20/21 1116  TempSrc: Temporal  PainSc:       Patients Stated Pain Goal: 0 (17/91/50 5697)  Complications: No notable events documented.

## 2021-03-20 NOTE — Progress Notes (Signed)
She is continuing to bleed, large amount of blood with clots, some very dark, some red.  She is fairly HD stable. She is getting 2 more units of blood, she is starting IV PPI bolus and drip and I am planning EGD urgently this AM to check for UGI source.

## 2021-03-20 NOTE — Interval H&P Note (Signed)
History and Physical Interval Note:  03/20/2021 11:15 AM  [name]  has presented today for surgery, with the diagnosis of GI bleeding.  The various methods of treatment have been discussed with the patient and family. After consideration of risks, benefits and other options for treatment, the patient has consented to  Procedure(s): ESOPHAGOGASTRODUODENOSCOPY (EGD) WITH PROPOFOL (N/A) as a surgical intervention.  The patient's history has been reviewed, patient examined, no change in status, stable for surgery.  I have reviewed the patient's chart and labs.  Questions were answered to the patient's satisfaction.     Milus Banister

## 2021-03-20 NOTE — Op Note (Signed)
[facility] Patient Name: [name] Procedure Date : 03/20/2021 MRN: [id number] Attending MD: Milus Banister , MD Date of Birth: [birth year] CSN: [id number] Age: 75 Admit Type: Inpatient Procedure:                Upper GI endoscopy Indications:              Hematochezia, Melena Providers:                Milus Banister, MD, Carlyn Reichert, RN, Cherylynn Ridges, Technician, Claybon Jabs CRNA, CRNA Referring MD:              Medicines:                Monitored Anesthesia Care Complications:            No immediate complications. Estimated blood loss:                            None. Estimated Blood Loss:     Estimated blood loss: none. Procedure:                Pre-Anesthesia Assessment:                           - Prior to the procedure, a History and Physical                            was performed, and patient medications and                            allergies were reviewed. The patient's tolerance of                            previous anesthesia was also reviewed. The risks                            and benefits of the procedure and the sedation                            options and risks were discussed with the patient.                            All questions were answered, and informed consent                            was obtained. Prior Anticoagulants: The patient has                            taken no previous anticoagulant or antiplatelet                            agents. ASA Grade Assessment: III - A patient with  severe systemic disease. After reviewing the risks                            and benefits, the patient was deemed in                            satisfactory condition to undergo the procedure.                           After obtaining informed consent, the endoscope was                            passed under direct vision. Throughout the                            procedure, the patient's  blood pressure, pulse, and                            oxygen saturations were monitored continuously. The                            [id number]-H190 ([id number]) Olympus endoscope was introduced                            through the mouth, and advanced to the second part                            of duodenum. The upper GI endoscopy was                            accomplished without difficulty. The patient                            tolerated the procedure well. Scope In: Scope Out: Findings:      The esophagus and stomach were normal.      There was no blood in the UGI tract.      There were two ulcers in the second segment of the duodenum. One was       cratered, linear, 1cm long and had a small visible vessel. The other was       round, fairly superfical, without concerning features. I treated the       ulcer with the visible vessel by dilute epinephrine injection and then       BiCap cautery with 7Fr Gold probe.      The exam was otherwise without abnormality. Impression:               - There were two ulcers in the second segment of                            the duodenum. One was cratered, linear, 1cm long                            and had a small visible vessel. The other was  round, fairly superfical, without concerning                            features. I treated the ulcer with the visible                            vessel by dilute epinephrine injection and then                            BiCap cautery with 7Fr Gold probe.                           - The examination was otherwise normal. Recommendation:           - The ulcer with the visible vessel is probably the                            source of her recent bleeding but it was not very                            large and the visible vessel was pretty small and                            there was no blood in the UGI tract. I think it is                            therefore still possible that she's  been having                            lower GI bleeding and that these ulcers were                            incidental. Regardless, I will keep her on clears                            today, IV PPI drip to continue. Should follow her                            clinically and transfuse blood products if needed                           - Drs. Collene Mares and Benson Norway will assume her care in the                            morning. Procedure Code(s):        --- Professional ---                           [id number], Esophagogastroduodenoscopy, flexible,                            transoral; with control of bleeding, any method Diagnosis Code(s):        --- Professional ---  K26.4, Chronic or unspecified duodenal ulcer with                            hemorrhage                           K92.1, Melena (includes Hematochezia) CPT copyright 2019 [employer]. All rights reserved. The codes documented in this report are preliminary and upon coder review may  be revised to meet current compliance requirements. Milus Banister, MD 03/20/2021 12:10:24 PM This report has been signed electronically. Number of Addenda: 0

## 2021-03-21 ENCOUNTER — Inpatient Hospital Stay (HOSPITAL_COMMUNITY): Payer: Medicare Other

## 2021-03-21 LAB — HEMOGLOBIN AND HEMATOCRIT, BLOOD
HCT: 24.2 % — ABNORMAL LOW (ref 36.0–46.0)
Hemoglobin: 8.1 g/dL — ABNORMAL LOW (ref 12.0–15.0)

## 2021-03-21 LAB — CBC WITH DIFFERENTIAL/PLATELET
Abs Immature Granulocytes: 1.05 10*3/uL — ABNORMAL HIGH (ref 0.00–0.07)
Basophils Absolute: 0.1 10*3/uL (ref 0.0–0.1)
Basophils Relative: 1 %
Eosinophils Absolute: 0 10*3/uL (ref 0.0–0.5)
Eosinophils Relative: 0 %
HCT: 24.1 % — ABNORMAL LOW (ref 36.0–46.0)
Hemoglobin: 7.8 g/dL — ABNORMAL LOW (ref 12.0–15.0)
Immature Granulocytes: 5 %
Lymphocytes Relative: 5 %
Lymphs Abs: 0.9 10*3/uL (ref 0.7–4.0)
MCH: 29.7 pg (ref 26.0–34.0)
MCHC: 32.4 g/dL (ref 30.0–36.0)
MCV: 91.6 fL (ref 80.0–100.0)
Monocytes Absolute: 1.1 10*3/uL — ABNORMAL HIGH (ref 0.1–1.0)
Monocytes Relative: 5 %
Neutro Abs: 17.6 10*3/uL — ABNORMAL HIGH (ref 1.7–7.7)
Neutrophils Relative %: 84 %
Platelets: 285 10*3/uL (ref 150–400)
RBC: 2.63 MIL/uL — ABNORMAL LOW (ref 3.87–5.11)
RDW: 16.7 % — ABNORMAL HIGH (ref 11.5–15.5)
WBC: 20.8 10*3/uL — ABNORMAL HIGH (ref 4.0–10.5)
nRBC: 0.6 % — ABNORMAL HIGH (ref 0.0–0.2)

## 2021-03-21 LAB — RENAL FUNCTION PANEL
Albumin: 2.2 g/dL — ABNORMAL LOW (ref 3.5–5.0)
Anion gap: 9 (ref 5–15)
BUN: 93 mg/dL — ABNORMAL HIGH (ref 8–23)
CO2: 20 mmol/L — ABNORMAL LOW (ref 22–32)
Calcium: 8.1 mg/dL — ABNORMAL LOW (ref 8.9–10.3)
Chloride: 108 mmol/L (ref 98–111)
Creatinine, Ser: 4.07 mg/dL — ABNORMAL HIGH (ref 0.44–1.00)
GFR, Estimated: 11 mL/min — ABNORMAL LOW (ref >60)
Glucose, Bld: 99 mg/dL (ref 70–99)
Phosphorus: 5.1 mg/dL — ABNORMAL HIGH (ref 2.5–4.6)
Potassium: 4.4 mmol/L (ref 3.5–5.1)
Sodium: 137 mmol/L (ref 135–145)

## 2021-03-21 LAB — CBC
HCT: 25.3 % — ABNORMAL LOW (ref 36.0–46.0)
Hemoglobin: 8.3 g/dL — ABNORMAL LOW (ref 12.0–15.0)
MCH: 29.5 pg (ref 26.0–34.0)
MCHC: 32.8 g/dL (ref 30.0–36.0)
MCV: 90 fL (ref 80.0–100.0)
Platelets: 282 10*3/uL (ref 150–400)
RBC: 2.81 MIL/uL — ABNORMAL LOW (ref 3.87–5.11)
RDW: 16.2 % — ABNORMAL HIGH (ref 11.5–15.5)
WBC: 21 10*3/uL — ABNORMAL HIGH (ref 4.0–10.5)
nRBC: 0.3 % — ABNORMAL HIGH (ref 0.0–0.2)

## 2021-03-21 IMAGING — NM NM GI BLOOD LOSS
2 series · 12 of 12 positions shown · non-contrast
Comparison: None.

CLINICAL DATA: GI bleed

EXAM:
NUCLEAR MEDICINE GASTROINTESTINAL BLEEDING SCAN
TECHNIQUE: Sequential abdominal images were obtained following intravenous
administration of [BL] labeled red blood cells.
RADIOPHARMACEUTICALS:  24.5 mCi [BL] pertechnetate in-vitro
labeled red cells.

[gi gi bleed · 4.52mm/px · 6 of 60 frames shown (1 of 2)]
[frame 6/60]
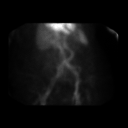
[frame 16/60]
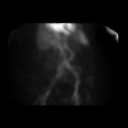
[frame 26/60]
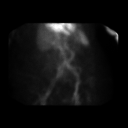
[frame 36/60]
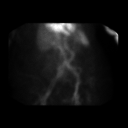
[frame 46/60]
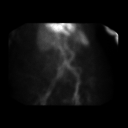
[frame 56/60]
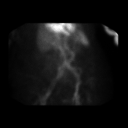

[gi gi bleed · 4.52mm/px · 6 of 60 frames shown (2 of 2)]
[frame 6/60]
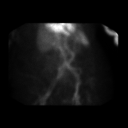
[frame 16/60]
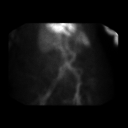
[frame 26/60]
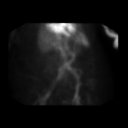
[frame 36/60]
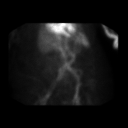
[frame 46/60]
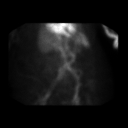
[frame 56/60]
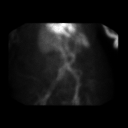

[12 of 12 positions shown; findings below may reference images not displayed]

FINDINGS: Imaging was continued for 2 hours. No evidence of
radiopharmaceutical extravasation or accumulation to localize GI
bleed.
IMPRESSION: No visible GI bleed.

## 2021-03-21 MED ORDER — TECHNETIUM TC 99M-LABELED RED BLOOD CELLS IV KIT
24.5000 | PACK | Freq: Once | INTRAVENOUS | Status: AC | PRN
  Administered 2021-03-21: 24.5 via INTRAVENOUS

## 2021-03-21 MED ORDER — DEXTROSE IN LACTATED RINGERS 5 % IV SOLN: INTRAVENOUS | Status: DC

## 2021-03-21 NOTE — Progress Notes (Addendum)
Subjective: Maroon stool x 2 this AM.  Objective: Vital signs in last 24 hours: Temp:  [96.1 F (35.6 C)-98.8 F (37.1 C)] 97.8 F (36.6 C) (09/06 0845) Pulse Rate:  [70-100] 70 (09/06 0300) Resp:  [17-25] 18 (09/06 0845) BP: (97-141)/(43-70) 140/66 (09/06 0845) SpO2:  [98 %-100 %] 98 % (09/06 0300) Weight:  [62.5 kg] 62.5 kg (09/06 0500) Last BM Date: 03/20/21  Intake/Output from previous day: 09/05 0701 - 09/06 0700 In: 2747.9 [P.O.:220; I.V.:595.9; Blood:1852; IV Piggyback:80] Out: -  Intake/Output this shift: Total I/O In: 432.3 [P.O.:240; I.V.:192.3] Out: -   General appearance: alert and no distress GI: soft, non-tender; bowel sounds normal; no masses,  no organomegaly  Lab Results: Recent Labs    03/20/21 0358 03/20/21 0925 03/20/21 1815 03/21/21 0157  WBC 17.0*  --  24.2* 21.0*  HGB 7.6* 6.3* 9.9* 8.3*  HCT 23.3* 18.9* 29.2* 25.3*  PLT 330  --  296 282   BMET Recent Labs    03/19/21 0800 03/20/21 0359 03/21/21 0157  NA QUANTITY NOT SUFFICIENT, UNABLE TO PERFORM TEST 139 137  K QUANTITY NOT SUFFICIENT, UNABLE TO PERFORM TEST 4.8 4.4  CL QUANTITY NOT SUFFICIENT, UNABLE TO PERFORM TEST 107 108  CO2 QUANTITY NOT SUFFICIENT, UNABLE TO PERFORM TEST 22 20*  GLUCOSE QUANTITY NOT SUFFICIENT, UNABLE TO PERFORM TEST 102* 99  BUN QUANTITY NOT SUFFICIENT, UNABLE TO PERFORM TEST 99* 93*  CREATININE QUANTITY NOT SUFFICIENT, UNABLE TO PERFORM TEST 4.21* 4.07*  CALCIUM QUANTITY NOT SUFFICIENT, UNABLE TO PERFORM TEST 8.3* 8.1*   LFT Recent Labs    03/20/21 0359 03/21/21 0157  PROT 4.3*  --   ALBUMIN 2.0* 2.2*  AST 11*  --   ALT 9  --   ALKPHOS 49  --   BILITOT 0.8  --    PT/INR Recent Labs    03/18/21 2036  LABPROT 15.0  INR 1.2   Hepatitis Panel No results for input(s): HEPBSAG, HCVAB, HEPAIGM, HEPBIGM in the last 72 hours. C-Diff No results for input(s): CDIFFTOX in the last 72 hours. Fecal Lactopherrin No results for input(s): FECLLACTOFRN in  the last 72 hours.  Studies/Results: No results found.  Medications: Scheduled:  sodium chloride   Intravenous Once   acetaminophen  650 mg Oral Once   DULoxetine  30 mg Oral Daily   mometasone-formoterol  2 puff Inhalation BID   mycophenolate  180 mg Oral BID   [START ON 03/23/2021] pantoprazole  40 mg Intravenous Q12H   predniSONE  5 mg Oral Daily   sodium chloride flush  3 mL Intravenous Q12H   tacrolimus  1 mg Oral QHS   tacrolimus  2 mg Oral Daily   Continuous:  sodium chloride 40 mL/hr at 03/21/21 0712   pantoprazole 8 mg/hr (03/21/21 0712)    Assessment/Plan: 1) GI bleed. 2) Duodenal ulcers s/p BICAP of a visible vessel. 3) Anemia.   The patient continues to exhibit maroon stool.  This was witnessed during this evaluation and nursing reported that this second episode was larger in volume than the first.  Her HGB did decline from 9.9 down to 8.3 this AM.  Her last colonoscopy was 11/16/2019 for a similar presentation, but it was brighter red blood that time.  This may be another diverticular bleed  Plan: 1) Check bleeding scan. 2) Follow HGB and transfuse as necessary.  LOS: 3 days   [name] D 03/21/2021, 11:47 AM

## 2021-03-21 NOTE — Care Management Important Message (Signed)
Important Message  Patient Details  Name: [name] MRN: [id number] Date of Birth: [birth year]   Medicare Important Message Given:        Orbie Pyo 03/21/2021, 1:51 PM

## 2021-03-21 NOTE — Progress Notes (Signed)
Pt resting comfortably in bed, Protonix drip continue to infuse, no bloody stool witnessed, NT denies any bm or bloody stool during shift. Will report off to oncoming RN. Delia Heady RN

## 2021-03-21 NOTE — Progress Notes (Signed)
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 Collene Mares HF PEF per echo 10/22/2020 , on 50-27% mild diastolic heart failure, right ventricular overload previously OSA  1 week history of prior to admission frequent stool dark red blood nausea scant nonbloody emesis--noted dark black stools 9/3 and syncopized at home  Brought to ED and syncopized again when trying to sit up WBC found to be 24 hemoglobin previously 13, on admission 7.1 platelet 511,  potassium 5.4 BUNs/creatinine 111/4.9  [prior creatinine 2.3]  Developed further acute bleeding during hospitalization 9/4 PM through 9/5 necessitating multiple units of transfusion  Decision made for urgent endoscopy 9/5 showing 2 duodenal ulcers treated with epinephrine and gold probe.   Hospital-Problem based course  Undifferentiated GI bleed, suspected diverticular still?--2 episodic bleeds 03/21/21 Upper GI ulcer x 2 status post endoscopy Status post urgent endoscopy 9/5 and gold cautery epinephrine injections into areas of duodenal ulcers Continues on PPI gtt., liquid diet at this time Going for urgent bleeding scan as per Dr. 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 1 mg p.m. and prednisone 5 Continue calcitriol when able to take p.o. 1 dose Lokelma on admit-potassium is improved -- mediated by some ATN D/w Dr Carolin Sicks clinical scenario on 9/5, feels that this is probably hemodynamically mediated-recommends supportive measures alone at this time Atrial fibrillation status post multiple procedures not on anticoagulation secondary to intracerebral hemorrhage in the past Keep monitors-Coreg 25 twice daily held and will resume if more hemodynamically stable HFpEF 55-60% previously Hold Lasix from prior to admission at this time Syncope on admission likely secondary to orthostasis from bleeding HTN Multiple meds have been held given risk for hypotension Resume meds once hemodynamically stable Depression Continue Cymbalta 30 daily    DVT prophylaxis: SCD Code Status: Full Family Communication: None Disposition:  Status is: Inpatient  Remains inpatient appropriate because:Hemodynamically unstable, Altered mental status, Ongoing diagnostic testing needed not appropriate for outpatient work up, and IV treatments appropriate due to intensity of illness or inability to take PO  Dispo: The patient is from: Home              Anticipated d/c is to: Home              Patient currently is not medically stable to d/c.   Difficult to place patient No   Consultants:  Gastroenterology  Procedures:   Antimicrobials:     Subjective:  Continues to have bleeding-after I left the room the nurse paged me to inform me patient had large amount of liquid black stool She was pretty asymptomatic has been on liquids which I have discontinued for the time being   Objective: Vitals:   03/20/21 2300 03/21/21 0300 03/21/21 0500 03/21/21 0845  BP: 121/63  116/62  140/66  Pulse: 70 70    Resp: 20 17  18   Temp: 97.9 F (36.6 C) 98.8 F (37.1 C)  97.8 F (36.6 C)  TempSrc: Oral  Oral  Oral  SpO2: 100% 98%    Weight:   62.5 kg   Height:        Intake/Output Summary (Last 24 hours) at 03/21/2021 1133 Last data filed at 03/21/2021 0830 Gross per 24 hour  Intake 2740.53 ml  Output --  Net 2740.53 ml    Filed Weights   03/20/21 0146 03/20/21 1051 03/21/21 0500  Weight: 65.1 kg 65.1 kg 62.5 kg    Examination:  lightly sleepy appearing  S1-S2 no murmur A. fib on monitors Abdomen nontender increased bowel sounds no rebound no guarding Moving 4 limbs equally power 5/5    Data Reviewed: personally reviewed   CBC    Component Value Date/Time   WBC 21.0 (H) 03/21/2021 0157   RBC 2.81 (L) 03/21/2021 0157   HGB 8.3 (L) 03/21/2021 0157   HGB 10.8 (L) 11/27/2019 1616   HCT 25.3 (L) 03/21/2021 0157   HCT 33.8 (L) 11/27/2019 1616   PLT 282 03/21/2021 0157   PLT 284 11/27/2019 1616   MCV 90.0 03/21/2021 0157   MCV 88 11/27/2019 1616   MCH 29.5 03/21/2021 0157   MCHC 32.8 03/21/2021 0157   RDW 16.2 (H) 03/21/2021 0157   RDW 15.1 11/27/2019 1616   LYMPHSABS 0.3 (L) 03/19/2021 1600   LYMPHSABS 0.6 (L) 11/27/2019 1616   MONOABS 0.0 (L) 03/19/2021 1600   EOSABS 0.0 03/19/2021 1600   EOSABS 0.0 11/27/2019 1616   BASOSABS 0.2 (H) 03/19/2021 1600   BASOSABS 0.1 11/27/2019 1616   CMP Latest Ref Rng & Units 03/21/2021 03/20/2021 03/19/2021  Glucose 70 - 99 mg/dL 99 102(H) QUANTITY NOT SUFFICIENT, UNABLE TO PERFORM TEST  BUN 8 - 23 mg/dL 93(H) 99(H) QUANTITY NOT SUFFICIENT, UNABLE TO PERFORM TEST  Creatinine 0.44 - 1.00 mg/dL 4.07(H) 4.21(H) QUANTITY NOT SUFFICIENT, UNABLE TO PERFORM TEST  Sodium 135 - 145 mmol/L 137 139 QUANTITY NOT SUFFICIENT, UNABLE TO PERFORM TEST  Potassium 3.5 - 5.1 mmol/L 4.4 4.8 QUANTITY NOT SUFFICIENT, UNABLE TO PERFORM TEST  Chloride 98 - 111 mmol/L 108 107 QUANTITY NOT SUFFICIENT, UNABLE TO PERFORM TEST  CO2 22 - 32 mmol/L 20(L) 22 QUANTITY NOT SUFFICIENT, UNABLE TO PERFORM TEST  Calcium 8.9 - 10.3 mg/dL 8.1(L) 8.3(L) QUANTITY NOT  SUFFICIENT, UNABLE TO PERFORM TEST  Total Protein 6.5 - 8.1 g/dL - 4.3(L) -  Total Bilirubin 0.3 - 1.2 mg/dL - 0.8 -  Alkaline Phos 38 - 126 U/L - 49 -  AST 15 - 41 U/L - 11(L) -  ALT 0 - 44 U/L - 9 -     Radiology Studies: No results found.   Scheduled Meds:  sodium chloride   Intravenous Once   acetaminophen  650 mg Oral Once   DULoxetine  30 mg Oral Daily   mometasone-formoterol  2 puff Inhalation BID   mycophenolate  180 mg Oral BID   [START ON 03/23/2021] pantoprazole  40 mg Intravenous Q12H   predniSONE  5 mg Oral Daily   sodium chloride flush  3 mL Intravenous Q12H   tacrolimus  1 mg Oral QHS   tacrolimus  2 mg Oral Daily   Continuous Infusions:  sodium chloride 40 mL/hr at 03/21/21 0712   pantoprazole 8 mg/hr (03/21/21 0712)     LOS: 3 days   Time spent:  [place], MD [facility] To contact the attending provider between 7A-7P or the covering provider during after hours 7P-7A, please log into the web site [email or web address] and access using universal [place] password for that web site. If you do not have the password, please call the hospital operator.  03/21/2021, 11:33 AM

## 2021-03-22 LAB — TYPE AND SCREEN
ABO/RH(D): O POS
Antibody Screen: NEGATIVE
Unit division: 0
Unit division: 0
Unit division: 0
Unit division: 0
Unit division: 0
Unit division: 0
Unit division: 0

## 2021-03-22 LAB — BPAM RBC
Blood Product Expiration Date: 202210012359
Blood Product Expiration Date: 202210022359
Blood Product Expiration Date: 202210022359
Blood Product Expiration Date: 202210022359
Blood Product Expiration Date: 202210022359
Blood Product Expiration Date: 202210052359
Blood Product Expiration Date: 202210052359
ISSUE DATE / TIME: 202209040000
ISSUE DATE / TIME: 202209041822
ISSUE DATE / TIME: 202209042300
ISSUE DATE / TIME: 202209051029
ISSUE DATE / TIME: 202209051324
Unit Type and Rh: 5100
Unit Type and Rh: 5100
Unit Type and Rh: 5100
Unit Type and Rh: 5100
Unit Type and Rh: 5100
Unit Type and Rh: 5100
Unit Type and Rh: 5100

## 2021-03-22 LAB — HEMOGLOBIN AND HEMATOCRIT, BLOOD
HCT: 22.7 % — ABNORMAL LOW (ref 36.0–46.0)
HCT: 22.1 % — ABNORMAL LOW (ref 36.0–46.0)
HCT: 23.5 % — ABNORMAL LOW (ref 36.0–46.0)
Hemoglobin: 7.7 g/dL — ABNORMAL LOW (ref 12.0–15.0)
Hemoglobin: 7.3 g/dL — ABNORMAL LOW (ref 12.0–15.0)
Hemoglobin: 7.5 g/dL — ABNORMAL LOW (ref 12.0–15.0)

## 2021-03-22 LAB — BASIC METABOLIC PANEL
Anion gap: 6 (ref 5–15)
BUN: 78 mg/dL — ABNORMAL HIGH (ref 8–23)
CO2: 22 mmol/L (ref 22–32)
Calcium: 8.2 mg/dL — ABNORMAL LOW (ref 8.9–10.3)
Chloride: 111 mmol/L (ref 98–111)
Creatinine, Ser: 3.82 mg/dL — ABNORMAL HIGH (ref 0.44–1.00)
GFR, Estimated: 12 mL/min — ABNORMAL LOW (ref >60)
Glucose, Bld: 117 mg/dL — ABNORMAL HIGH (ref 70–99)
Potassium: 3.3 mmol/L — ABNORMAL LOW (ref 3.5–5.1)
Sodium: 139 mmol/L (ref 135–145)

## 2021-03-22 NOTE — Progress Notes (Signed)
PROGRESS NOTE    [name]  VZD:[id number] DOB: 07-15-1946 DOA: 03/18/2021 PCP: [name], NP   Chief Complaint  Patient presents with   GI Bleeding   Hypotension   Brief Narrative: 75 yo with hx ESRD s/p renal transplant on chronic immunosuppression, atrial fibrillation s/p AV nodal ablation and PPM placemen/watchman procedure, prior PE/DVT/IVC filter c/b intracerebral hemorrhage 2010 off anticoagulation, prior lower GI bleeds with diverticulosis (follows with Dr. Collene Mares), HFpEF, OSA who presented with 1 week of frequent dark re stools, dark black stools on 9/3 and fainted.  She had recurrent syncope in the ED.  She's had recurrent bleeding in house, requiring 5 units pRBC at this point.  She had urgent endoscopy on 9/5 which showed 2 duodenal ulcers which were treated with epinephrine and BiCap cautery.   Assessment & Plan:   Principal Problem:   Acute GI bleeding Active Problems:   Asthmatic bronchitis without complication   Acute kidney injury superimposed on CKD (HCC)   Symptomatic anemia   Obstructive sleep apnea   Hyperkalemia   Persistent atrial fibrillation (HCC)   Immunosuppression (HCC)  GI Bleed  Duodenal Ulcers  Diverticulosis At this point, duodenal ulcer maybe most likely source, but also possible she's having lower GI bleeding and ulcers were incidental (see EGD report 9/5) S/p treatment of duodenal ulcer with visible vessel by dilute epinephrine injection and bicap cautery Tagged RBC scan negative Continue PPI gtt  Will advance diet as tolerated Needs to avoid NSAIDs in the future GI c/s, appreciate recommendations  Acute Blood Loss Anemia S/p 5 units pRBC Hb relatively stable today in the 7's Follow and transfuse for <7 or symptomatic  Leukocytosis Afebrile, stress related? She's on chronic steroids, though low dose - 5 mg Continue to monitor, w/u additionally as indicated  ESRD s/p Transplant Acute Kidney Injury on CKD IV Baseline  creatinine appears to be ~2.3 Presented with creatinine around 4.9 -> peaked to 5.1 Improving at this time Follow UA Consider renal US  Continue mycophenolate, prograf, prednisone Thought related to ATN due to GI bleed per prior MD discussion with renal  Atrial fibrillation s/p AV nodal ablation  PPM  Watchman No longer on anticoagulation in setting of ICH  Hx DVT and PE  S/P IVC filter Not on anticoagulation due to hx of ICH  Syncope 2/2 ABLA  Hypertension Holding BP meds in setting of above  Depression Continue cymbalat  DVT prophylaxis:SCD Code Status: full  Family Communication: extensive conversation with Mr. Vanderhoof - he was upset and felt like he hadn't been kept in the loop - frustrated on time it took to get updated on test from yesterday.  Mentioned he could make Donovan Gatchel phone call and have Mrs. Linton Rump transferred.  We discussed her current status and the uncertainty in current diagnosis and need for additional monitoring.  Discussed I'm getting to know her and many other patient's so sometimes it takes Hartlee Amedee little longer to call, but I did discuss with Mrs. Salce in the morning.  Apologized for any miscommunication or issues with communication.   Disposition:   Status is: Inpatient  Remains inpatient appropriate because:Inpatient level of care appropriate due to severity of illness  Dispo: The patient is from: Home              Anticipated d/c is to: Home              Patient currently is not medically stable to d/c.   Difficult to place patient  No       Consultants:  GI  Procedures: Impression - There were two ulcers in the second segment of the duodenum. One was cratered, linear, 1cm long and had Arraya Buck small visible vessel. The other was round, fairly superfical, without concerning features. I treated the ulcer with the visible vessel by dilute epinephrine injection and then BiCap cautery with 7Fr Gold probe. - The examination was otherwise normal. Recommendation -  The ulcer with the visible vessel is probably the source of her recent bleeding but it was not very large and the visible vessel was pretty small and there was no blood in the UGI tract. I think it is therefore still possible that she's been having lower GI bleeding and that these ulcers were incidental. Regardless, I will keep her on clears today, IV PPI drip to continue. Should follow her clinically and transfuse blood products if needed - Drs. Collene Mares and Benson Norway will assume her care in the morning.   Antimicrobials:  Anti-infectives (From admission, onward)    None          Subjective: No complaints today  Objective: Vitals:   03/22/21 0532 03/22/21 0754 03/22/21 1124 03/22/21 1556  BP:  (!) 155/64 (!) 162/74 (!) 159/71  Pulse:  73 68 70  Resp:  16 17 19   Temp:  97.9 F (36.6 C) 97.6 F (36.4 C) 98.6 F (37 C)  TempSrc:  Oral Oral Oral  SpO2:  100% 96% 100%  Weight: 62.4 kg     Height:        Intake/Output Summary (Last 24 hours) at 03/22/2021 1744 Last data filed at 03/22/2021 1337 Gross per 24 hour  Intake 2602.8 ml  Output --  Net 2602.8 ml   Filed Weights   03/20/21 1051 03/21/21 0500 03/22/21 0532  Weight: 65.1 kg 62.5 kg 62.4 kg    Examination:  General exam: Appears calm and comfortable  Respiratory system: Clear to auscultation. Respiratory effort normal. Cardiovascular system: S1 & S2 heard, RRR.  Gastrointestinal system: Abdomen is nondistended, soft and nontender.  Central nervous system: Alert and oriented. No focal neurological deficits. Extremities: no LEE Skin: No rashes, lesions or ulcers Psychiatry: Judgement and insight appear normal. Mood & affect appropriate.     Data Reviewed: I have personally reviewed following labs and imaging studies  CBC: Recent Labs  Lab 03/18/21 2036 03/18/21 2242 03/19/21 1600 03/20/21 0358 03/20/21 0925 03/20/21 1815 03/21/21 0157 03/21/21 1215 03/21/21 1857 03/22/21 0138 03/22/21 0933  WBC 24.9*   <  > 17.1* 17.0*  --  24.2* 21.0* 20.8*  --   --   --   NEUTROABS 21.7*  --  16.6*  --   --   --   --  17.6*  --   --   --   HGB 7.1*   < > 6.9* 7.6*   < > 9.9* 8.3* 7.8* 8.1* 7.7* 7.3*  HCT 22.6*   < > 21.3* 23.3*   < > 29.2* 25.3* 24.1* 24.2* 22.7* 22.1*  MCV 85.0   < > 86.2 88.3  --  90.1 90.0 91.6  --   --   --   PLT 511*   < > 396 330  --  296 282 285  --   --   --    < > = values in this interval not displayed.    Basic Metabolic Panel: Recent Labs  Lab 03/18/21 2036 03/18/21 2242 03/19/21 0800 03/20/21 0359 03/21/21 0157 03/22/21 2426  NA 133* 134* QUANTITY NOT SUFFICIENT, UNABLE TO PERFORM TEST 139 137 139  K 5.4* 5.4* QUANTITY NOT SUFFICIENT, UNABLE TO PERFORM TEST 4.8 4.4 3.3*  CL 100 103 QUANTITY NOT SUFFICIENT, UNABLE TO PERFORM TEST 107 108 111  CO2 23  --  QUANTITY NOT SUFFICIENT, UNABLE TO PERFORM TEST 22 20* 22  GLUCOSE 143* 128* QUANTITY NOT SUFFICIENT, UNABLE TO PERFORM TEST 102* 99 117*  BUN 111* 112* QUANTITY NOT SUFFICIENT, UNABLE TO PERFORM TEST 99* 93* 78*  CREATININE 4.97* 5.10* QUANTITY NOT SUFFICIENT, UNABLE TO PERFORM TEST 4.21* 4.07* 3.82*  CALCIUM 8.6*  --  QUANTITY NOT SUFFICIENT, UNABLE TO PERFORM TEST 8.3* 8.1* 8.2*  PHOS  --   --   --   --  5.1*  --     GFR: Estimated Creatinine Clearance: 12 mL/min (Koki Buxton) (by C-G formula based on SCr of 3.82 mg/dL (H)).  Liver Function Tests: Recent Labs  Lab 03/18/21 2036 03/20/21 0359 03/21/21 0157  AST 10* 11*  --   ALT 10 9  --   ALKPHOS 87 49  --   BILITOT 0.5 0.8  --   PROT 5.3* 4.3*  --   ALBUMIN 2.4* 2.0* 2.2*    CBG: No results for input(s): GLUCAP in the last 168 hours.   No results found for this or any previous visit (from the past 240 hour(s)).       Radiology Studies: NM GI Blood Loss  Result Date: 03/21/2021 CLINICAL DATA:  GI bleed EXAM: NUCLEAR MEDICINE GASTROINTESTINAL BLEEDING SCAN TECHNIQUE: Sequential abdominal images were obtained following intravenous administration of Tc-37m  labeled red blood cells. RADIOPHARMACEUTICALS:  24.5 mCi Tc-77m pertechnetate in-vitro labeled red cells. COMPARISON:  None. FINDINGS: Imaging was continued for 2 hours. No evidence of radiopharmaceutical extravasation or accumulation to localize GI bleed. IMPRESSION: No visible GI bleed. Electronically Signed   By: Rolm Baptise M.D.   On: 03/21/2021 19:33        Scheduled Meds:  sodium chloride   Intravenous Once   acetaminophen  650 mg Oral Once   DULoxetine  30 mg Oral Daily   mometasone-formoterol  2 puff Inhalation BID   mycophenolate  180 mg Oral BID   [START ON 03/23/2021] pantoprazole  40 mg Intravenous Q12H   predniSONE  5 mg Oral Daily   sodium chloride flush  3 mL Intravenous Q12H   tacrolimus  1 mg Oral QHS   tacrolimus  2 mg Oral Daily   Continuous Infusions:  sodium chloride 40 mL/hr at 03/21/21 2236   pantoprazole 8 mg/hr (03/22/21 0953)     LOS: 4 days    Time spent: over 30 min    Fayrene Helper, MD [facility]   To contact the attending provider between 7A-7P or the covering provider during after hours 7P-7A, please log into the web site [email or web address] and access using universal [place] password for that web site. If you do not have the password, please call the hospital operator.  03/22/2021, 5:44 PM

## 2021-03-22 NOTE — Evaluation (Signed)
Physical Therapy Evaluation Patient Details Name: [name] MRN: [id number] DOB: 1946-06-03 Today's Date: 03/22/2021   History of Present Illness  75 yo female admitted with GIB duodenal ulcers s/p BICAP of visible vessel and anemia. PMH ESRD s/p renal transplant 2017 on chronic immunosuppression, Afib, PPM COVID 8/27-8/29 admission, PE / DVT/ IVC complicated by Linganore 1287, diverticulosis 11/2019 depression  Clinical Impression  PTA, patient lives with husband and reports independence with use of SPC. Patient states she drives, however also includes that family attempts to hide her keys at times but does not elaborate. Patient requires min guard overall for mobility with use of RW. Patient presents with generalized weakness, impaired balance, decreased activity tolerance, and decreased safety awareness. Patient will benefit from skilled PT services during acute stay to address listed deficits. Recommend HHPT following discharge to maximize functional independence and safety in the home.     Follow Up Recommendations Home health PT;Supervision for mobility/OOB    Equipment Recommendations  Rolling [name] with 5" wheels    Recommendations for Other Services       Precautions / Restrictions Precautions Precautions: Fall Precaution Comments: SBP <160 Restrictions Weight Bearing Restrictions: No      Mobility  Bed Mobility Overal bed mobility: Needs Assistance Bed Mobility: Supine to Sit     Supine to sit: Supervision     General bed mobility comments: supervision for safety    Transfers Overall transfer level: Needs assistance Equipment used: Rolling [name] (2 wheeled) Transfers: Sit to/from Stand Sit to Stand: Min guard         General transfer comment: min guard for safety, increased time to complete from low surface  Ambulation/Gait Ambulation/Gait assistance: Min guard Gait Distance (Feet): 20 Feet (x75') Assistive device: [name] (2 wheeled) Gait  Pattern/deviations: Step-through pattern;Decreased stride length;Trunk flexed Gait velocity: decreased   General Gait Details: slow steady pace with use of RW. Min guard for safety. No LOB noted throughout. Patient reports fatigue following ambulation  Stairs            Wheelchair Mobility    Modified Rankin (Stroke Patients Only)       Balance Overall balance assessment: Mild deficits observed, not formally tested                                           Pertinent Vitals/Pain Pain Assessment: Faces Faces Pain Scale: No hurt Pain Intervention(s): Monitored during session    Home Living Family/patient expects to be discharged to:: Private residence Living Arrangements: Spouse/significant other Available Help at Discharge: Family;Available 24 hours/day Type of Home: House Home Access: Stairs to enter Entrance Stairs-Rails: Right;Left;Can reach both Entrance Stairs-Number of Steps: 7 Home Layout: Two level (basement) Home Equipment: Tub bench;Cane - single point;[name] - 2 wheels;Bedside commode;Grab bars - tub/shower;Hand held shower head      Prior Function Level of Independence: Independent with assistive device(s)         Comments: driving, uses cane primarily for mobility, manages own mediations. Cooks when she wants to. 1 fall in last 3 months     Hand Dominance   Dominant Hand: Right    Extremity/Trunk Assessment   Upper Extremity Assessment Upper Extremity Assessment: Defer to OT evaluation    Lower Extremity Assessment Lower Extremity Assessment: Generalized weakness    Cervical / Trunk Assessment Cervical / Trunk Assessment: Kyphotic  Communication   Communication:  No difficulties  Cognition Arousal/Alertness: Awake/alert Behavior During Therapy: WFL for tasks assessed/performed Overall Cognitive Status: No family/caregiver present to determine baseline cognitive functioning                                  General Comments: states "they try to hide my keys but I have a second set". Does not elaborate on reasoning for family to hide keys. No family present to determine baseline      General Comments      Exercises     Assessment/Plan    PT Assessment Patient needs continued PT services  PT Problem List Decreased strength;Decreased activity tolerance;Decreased balance;Decreased mobility;Cardiopulmonary status limiting activity;Decreased safety awareness       PT Treatment Interventions DME instruction;Gait training;Stair training;Functional mobility training;Therapeutic activities;Therapeutic exercise;Balance training;Patient/family education    PT Goals (Current goals can be found in the Care Plan section)  Acute Rehab PT Goals Patient Stated Goal: to go home PT Goal Formulation: With patient Time For Goal Achievement: 04/05/21 Potential to Achieve Goals: Good    Frequency Min 3X/week   Barriers to discharge        Co-evaluation               AM-PAC PT "6 Clicks" Mobility  Outcome Measure Help needed turning from your back to your side while in a flat bed without using bedrails?: A Little Help needed moving from lying on your back to sitting on the side of a flat bed without using bedrails?: A Little Help needed moving to and from a bed to a chair (including a wheelchair)?: A Little Help needed standing up from a chair using your arms (e.g., wheelchair or bedside chair)?: A Little Help needed to walk in hospital room?: A Little Help needed climbing 3-5 steps with a railing? : A Little 6 Click Score: 18    End of Session   Activity Tolerance: Patient tolerated treatment well Patient left: in chair;with call bell/phone within reach;with chair alarm set Nurse Communication: Mobility status PT Visit Diagnosis: Unsteadiness on feet (R26.81);Muscle weakness (generalized) (M62.81)    Time: 4656-8127 PT Time Calculation (min) (ACUTE ONLY): 32 min   Charges:   PT  Evaluation $PT Eval Moderate Complexity: 1 Mod PT Treatments $Gait Training: 8-22 mins        Maloree Uplinger A. Gilford Rile PT, DPT Acute Rehabilitation Services Pager [phone] Office [phone]   Linna Hoff 03/22/2021, 12:50 PM

## 2021-03-22 NOTE — Progress Notes (Signed)
Subjective: Patient seems to be doing well today; she denies having any abdominal pain, nausea or vomiting. She is tolerating a full liquid diet well. She had some rectal bleeding twice yesterday but none since then.  Her hemoglobin dropped from 9.9 g/dL to 7.7 g/dL today.  Objective: Vital signs in last 24 hours: Temp:  [97.6 F (36.4 C)-99 F (37.2 C)] 97.6 F (36.4 C) (09/07 1124) Pulse Rate:  [68-74] 68 (09/07 1124) Resp:  [16-20] 17 (09/07 1124) BP: (147-162)/(58-74) 162/74 (09/07 1124) SpO2:  [96 %-100 %] 96 % (09/07 1124) Weight:  [62.4 kg] 62.4 kg (09/07 0532) Last BM Date: 03/21/21  Intake/Output from previous day: 09/06 0701 - 09/07 0700 In: 2506.3 [P.O.:800; I.V.:1706.3] Out: 100 [Urine:100] Intake/Output this shift: Total I/O In: 449.7 [I.V.:449.7] Out: -   General appearance: cooperative, appears stated age, no distress, morbidly obese, and pale Resp: clear to auscultation bilaterally Cardio: regular rate and rhythm, S1, S2 normal, no murmur, click, rub or gallop GI: soft, non-tender; bowel sounds normal; no masses,  no organomegaly  Lab Results: Recent Labs    03/20/21 1815 03/21/21 0157 03/21/21 1215 03/21/21 1857 03/22/21 0138 03/22/21 0933  WBC 24.2* 21.0* 20.8*  --   --   --   HGB 9.9* 8.3* 7.8* 8.1* 7.7* 7.3*  HCT 29.2* 25.3* 24.1* 24.2* 22.7* 22.1*  PLT 296 282 285  --   --   --    BMET Recent Labs    03/20/21 0359 03/21/21 0157 03/22/21 0933  NA 139 137 139  K 4.8 4.4 3.3*  CL 107 108 111  CO2 22 20* 22  GLUCOSE 102* 99 117*  BUN 99* 93* 78*  CREATININE 4.21* 4.07* 3.82*  CALCIUM 8.3* 8.1* 8.2*   LFT Recent Labs    03/20/21 0359 03/21/21 0157  PROT 4.3*  --   ALBUMIN 2.0* 2.2*  AST 11*  --   ALT 9  --   ALKPHOS 49  --   BILITOT 0.8  --    Studies/Results: NM GI Blood Loss  Result Date: 03/21/2021 CLINICAL DATA:  GI bleed EXAM: NUCLEAR MEDICINE GASTROINTESTINAL BLEEDING SCAN TECHNIQUE: Sequential abdominal images were  obtained following intravenous administration of Tc-66m labeled red blood cells. RADIOPHARMACEUTICALS:  24.5 mCi Tc-27m pertechnetate in-vitro labeled red cells. COMPARISON:  None. FINDINGS: Imaging was continued for 2 hours. No evidence of radiopharmaceutical extravasation or accumulation to localize GI bleed. IMPRESSION: No visible GI bleed. Electronically Signed   By: Rolm Baptise M.D.   On: 03/21/2021 19:33    Medications: I have reviewed the patient's current medications.  Assessment/Plan: 1) Severe anemia with a duodenal ulcers treated with BiCAP probe and epinephrine/GI bleeding scan done yesterday was negative-she is seems to have stabilized. I think the drop in her hemoglobin may be due to equilibration.  I plan to advance her diet to a regular diet today. We will monitor her closely for any further signs of bleeding. Avoidance of use of all nonsteroidals has been discussed with her in great detail. 2) Diverticulosis without any evidence of diverticulitis. 3) End-stage renal disease s/p renal transplant 2017 on chronic immunosuppression. 4) History of atrial fibrillation with AV node ablation and PPM placement/watchman procedure. 5) OSA. 6) History of pulmonary embolism/DVT/IVC complicated by intracerebral hemorrhage off of anticoagulation since 2010  LOS: 4 days   [name] 03/22/2021, 1:12 PM

## 2021-03-22 NOTE — Evaluation (Signed)
Occupational Therapy Evaluation Patient Details Name: [name] MRN: [id number] DOB: 1945/08/13 Today's Date: 03/22/2021    History of Present Illness 75 yo female admitted with GIB duodenal ulcers s/p BICAP of visible vessel and anemia. PMH ESRD s/p renal transplant 2017 on chronic immunosuppression, Afib, PPM COVID 8/27-8/29 admission, PE / DVT/ IVC complicated by [place] 3825, diverticulosis 11/2019 depression   Clinical Impression   PT admitted with GIB. Pt currently with functional limitiations due to the deficits listed below (see OT problem list). Pt currently with balance deficits. Pt expressed her family hides her car keys from her but she has spare pair so still driving. No family present to express baseline cognition. Pt demonstrates decrease activity tolerance demonstrated by needing a rest break after washing hands at sink.  Pt  Pt will benefit from skilled OT to increase their independence and safety with adls and balance to allow discharge [place].     Follow Up Recommendations  Home health OT    Equipment Recommendations  None recommended by OT (has DME at home)    Recommendations for Other Services       Precautions / Restrictions Precautions Precautions: Fall Precaution Comments: SBP <160 Restrictions Weight Bearing Restrictions: No      Mobility Bed Mobility Overal bed mobility: Needs Assistance Bed Mobility: Supine to Sit     Supine to sit: Supervision     General bed mobility comments: supervision for safety    Transfers Overall transfer level: Needs assistance Equipment used: Rolling walker (2 wheeled) Transfers: Sit to/from Stand Sit to Stand: Min guard         General transfer comment: min guard for safety, increased time to complete from low surface    Balance Overall balance assessment: Mild deficits observed, not formally tested                                         ADL either performed or assessed with clinical  judgement   ADL Overall ADL's : Needs assistance/impaired Eating/Feeding: Modified independent   Grooming: Wash/dry hands;Min guard;Standing Grooming Details (indicate cue type and reason): LOB once during task and using 1 UE support on sink for support Upper Body Bathing: Min guard   Lower Body Bathing: Minimal assistance   Upper Body Dressing : Min guard   Lower Body Dressing: Minimal assistance Lower Body Dressing Details (indicate cue type and reason): able to figure 4 cross in chair Toilet Transfer: Min guard   Toileting- Clothing Manipulation and Hygiene: Minimal assistance Toileting - Clothing Manipulation Details (indicate cue type and reason): pt with incontinence and soiled brief. pt needs cues to change brief. pt agreeable and following 2 step commands   Tub/Shower Transfer Details (indicate cue type and reason): fall risk for stepping over surfaces demonstrated with bathroom door frame Functional mobility during ADLs: Minimal assistance General ADL Comments: pt reports baseline doing all adls without help and doing IADLS by using grocery store shopping care     Vision Baseline Vision/History: 1 Wears glasses Ability to See in Adequate Light: 1 Impaired Additional Comments: only wears when driving     Perception     Praxis      Pertinent Vitals/Pain Pain Assessment: Faces Faces Pain Scale: No hurt Pain Intervention(s): Monitored during session     Hand Dominance Right   Extremity/Trunk Assessment Upper Extremity Assessment Upper Extremity Assessment: LUE deficits/detail LUE Deficits /  Details: restricted shoulder flexion to 60 degrees. pt states "i can get it up there" pt then flexing neck to meet hand to increase appearance of ROM. pt with limited shoulder flexion   Lower Extremity Assessment Lower Extremity Assessment: Defer to PT evaluation   Cervical / Trunk Assessment Cervical / Trunk Assessment: Kyphotic   Communication  Communication Communication: No difficulties   Cognition Arousal/Alertness: Awake/alert Behavior During Therapy: WFL for tasks assessed/performed Overall Cognitive Status: No family/caregiver present to determine baseline cognitive functioning                                 General Comments: states "they try to hide my keys but I have a second set". Does not elaborate on reasoning for family to hide keys. No family present to determine baseline   General Comments  VSS, pt reliant on UE support throughout session    Exercises     Shoulder Instructions      Home Living Family/patient expects to be discharged to:: Private residence Living Arrangements: Spouse/significant other Available Help at Discharge: Family;Available 24 hours/day Type of Home: House Home Access: Stairs to enter [employer] of Steps: 7 Entrance Stairs-Rails: Right;Left;Can reach both Home Layout: Two level (basement)   Alternate Level Stairs-Rails: Right;Left;Can reach both Bathroom Shower/Tub: Tub/shower unit;Walk-in shower (uses tub shower mainly with tub bench)   Bathroom Toilet: Handicapped height     Home Equipment: Tub bench;Cane - single point;Walker - 2 wheels;Bedside commode;Grab bars - tub/shower;Hand held shower head          Prior Functioning/Environment Level of Independence: Independent with assistive device(s)        Comments: driving, uses cane primarily for mobility, manages own mediations. Cooks when she wants to. 1 fall in last 3 months        OT Problem List: Decreased strength;Decreased activity tolerance;Impaired balance (sitting and/or standing);Decreased safety awareness;Decreased knowledge of use of DME or AE;Decreased knowledge of precautions;Obesity;Impaired UE functional use;Decreased cognition      OT Treatment/Interventions: Self-care/ADL training;Therapeutic exercise;Energy conservation;DME and/or AE instruction;Therapeutic  activities;Cognitive remediation/compensation;Patient/family education;Balance training    OT Goals(Current goals can be found in the care plan section) Acute Rehab OT Goals Patient Stated Goal: to go home OT Goal Formulation: With patient Time For Goal Achievement: 04/05/21 Potential to Achieve Goals: Good  OT Frequency: Min 2X/week   Barriers to D/C:            Co-evaluation              AM-PAC OT "6 Clicks" Daily Activity     Outcome Measure Help from another person eating meals?: None Help from another person taking care of personal grooming?: A Little Help from another person toileting, which includes using toliet, bedpan, or urinal?: A Little Help from another person bathing (including washing, rinsing, drying)?: A Little Help from another person to put on and taking off regular upper body clothing?: A Little Help from another person to put on and taking off regular lower body clothing?: A Little 6 Click Score: 19   End of Session Equipment Utilized During Treatment: Gait belt;Rolling walker Nurse Communication: Mobility status;Precautions  Activity Tolerance: Patient tolerated treatment well Patient left: Other (comment);in chair (PT Alayna present pending further PT evaluation at this time. Pt required rest break after OT adl task)  OT Visit Diagnosis: Unsteadiness on feet (R26.81);Muscle weakness (generalized) (M62.81)  Time: 3496-1164 OT Time Calculation (min): 24 min Charges:  OT General Charges $OT Visit: 1 Visit OT Evaluation $OT Eval Moderate Complexity: 1 Mod   [name], OTR/L  Acute Rehabilitation Services Pager: [phone] Office: [phone] .   Jeri Modena 03/22/2021, 2:30 PM

## 2021-03-22 NOTE — Care Management Important Message (Signed)
Important Message  Patient Details  Name: [name] MRN: [id number] Date of Birth: August 12, 1945   Medicare Important Message Given:  Yes     Orbie Pyo 03/22/2021, 8:19 AM

## 2021-03-23 ENCOUNTER — Inpatient Hospital Stay (HOSPITAL_COMMUNITY): Payer: Medicare Other

## 2021-03-23 LAB — COMPREHENSIVE METABOLIC PANEL
ALT: 9 U/L (ref 0–44)
AST: 12 U/L — ABNORMAL LOW (ref 15–41)
Albumin: 2.2 g/dL — ABNORMAL LOW (ref 3.5–5.0)
Alkaline Phosphatase: 42 U/L (ref 38–126)
Anion gap: 5 (ref 5–15)
BUN: 68 mg/dL — ABNORMAL HIGH (ref 8–23)
CO2: 21 mmol/L — ABNORMAL LOW (ref 22–32)
Calcium: 8.2 mg/dL — ABNORMAL LOW (ref 8.9–10.3)
Chloride: 113 mmol/L — ABNORMAL HIGH (ref 98–111)
Creatinine, Ser: 3.66 mg/dL — ABNORMAL HIGH (ref 0.44–1.00)
GFR, Estimated: 13 mL/min — ABNORMAL LOW (ref >60)
Glucose, Bld: 94 mg/dL (ref 70–99)
Potassium: 3.8 mmol/L (ref 3.5–5.1)
Sodium: 139 mmol/L (ref 135–145)
Total Bilirubin: 0.7 mg/dL (ref 0.3–1.2)
Total Protein: 4.6 g/dL — ABNORMAL LOW (ref 6.5–8.1)

## 2021-03-23 LAB — URINALYSIS, COMPLETE (UACMP) WITH MICROSCOPIC
Bilirubin Urine: NEGATIVE
Glucose, UA: 50 mg/dL — AB
Ketones, ur: NEGATIVE mg/dL
Nitrite: NEGATIVE
Protein, ur: NEGATIVE mg/dL
Specific Gravity, Urine: 1.009 (ref 1.005–1.030)
pH: 6 (ref 5.0–8.0)

## 2021-03-23 LAB — CBC WITH DIFFERENTIAL/PLATELET
Abs Immature Granulocytes: 0.72 10*3/uL — ABNORMAL HIGH (ref 0.00–0.07)
Abs Immature Granulocytes: 0.71 10*3/uL — ABNORMAL HIGH (ref 0.00–0.07)
Basophils Absolute: 0.1 10*3/uL (ref 0.0–0.1)
Basophils Absolute: 0 10*3/uL (ref 0.0–0.1)
Basophils Relative: 0 %
Basophils Relative: 0 %
Eosinophils Absolute: 0.1 10*3/uL (ref 0.0–0.5)
Eosinophils Absolute: 0.1 10*3/uL (ref 0.0–0.5)
Eosinophils Relative: 1 %
Eosinophils Relative: 1 %
HCT: 21.7 % — ABNORMAL LOW (ref 36.0–46.0)
HCT: 21 % — ABNORMAL LOW (ref 36.0–46.0)
Hemoglobin: 7 g/dL — ABNORMAL LOW (ref 12.0–15.0)
Hemoglobin: 6.9 g/dL — CL (ref 12.0–15.0)
Immature Granulocytes: 5 %
Immature Granulocytes: 5 %
Lymphocytes Relative: 7 %
Lymphocytes Relative: 6 %
Lymphs Abs: 1.1 10*3/uL (ref 0.7–4.0)
Lymphs Abs: 0.9 10*3/uL (ref 0.7–4.0)
MCH: 30.4 pg (ref 26.0–34.0)
MCH: 30.7 pg (ref 26.0–34.0)
MCHC: 32.3 g/dL (ref 30.0–36.0)
MCHC: 32.9 g/dL (ref 30.0–36.0)
MCV: 94.3 fL (ref 80.0–100.0)
MCV: 93.3 fL (ref 80.0–100.0)
Monocytes Absolute: 1.2 10*3/uL — ABNORMAL HIGH (ref 0.1–1.0)
Monocytes Absolute: 1.1 10*3/uL — ABNORMAL HIGH (ref 0.1–1.0)
Monocytes Relative: 8 %
Monocytes Relative: 7 %
Neutro Abs: 11.8 10*3/uL — ABNORMAL HIGH (ref 1.7–7.7)
Neutro Abs: 11.9 10*3/uL — ABNORMAL HIGH (ref 1.7–7.7)
Neutrophils Relative %: 79 %
Neutrophils Relative %: 81 %
Platelets: 285 10*3/uL (ref 150–400)
Platelets: 274 10*3/uL (ref 150–400)
RBC: 2.3 MIL/uL — ABNORMAL LOW (ref 3.87–5.11)
RBC: 2.25 MIL/uL — ABNORMAL LOW (ref 3.87–5.11)
RDW: 17.6 % — ABNORMAL HIGH (ref 11.5–15.5)
RDW: 17.6 % — ABNORMAL HIGH (ref 11.5–15.5)
WBC: 15 10*3/uL — ABNORMAL HIGH (ref 4.0–10.5)
WBC: 14.7 10*3/uL — ABNORMAL HIGH (ref 4.0–10.5)
nRBC: 0.5 % — ABNORMAL HIGH (ref 0.0–0.2)
nRBC: 0.3 % — ABNORMAL HIGH (ref 0.0–0.2)

## 2021-03-23 LAB — PHOSPHORUS: Phosphorus: 4.6 mg/dL (ref 2.5–4.6)

## 2021-03-23 LAB — HEMOGLOBIN AND HEMATOCRIT, BLOOD
HCT: 28.1 % — ABNORMAL LOW (ref 36.0–46.0)
Hemoglobin: 9.2 g/dL — ABNORMAL LOW (ref 12.0–15.0)

## 2021-03-23 LAB — PREPARE RBC (CROSSMATCH)

## 2021-03-23 LAB — MAGNESIUM: Magnesium: 1.9 mg/dL (ref 1.7–2.4)

## 2021-03-23 IMAGING — US US RENAL TRANSPLANT
1 series · 14 of 25 positions shown · non-contrast
Comparison: Transplant ultrasound [DATE]

CLINICAL DATA: Acute kidney injury.

EXAM:
ULTRASOUND OF RENAL TRANSPLANT WITH RENAL DOPPLER ULTRASOUND
TECHNIQUE: Ultrasound examination of the renal transplant was performed with
gray-scale, color and duplex doppler evaluation.

[Series 1: us renal transplant w/doppler · 14 of 27 slices shown]
[im 1/27]
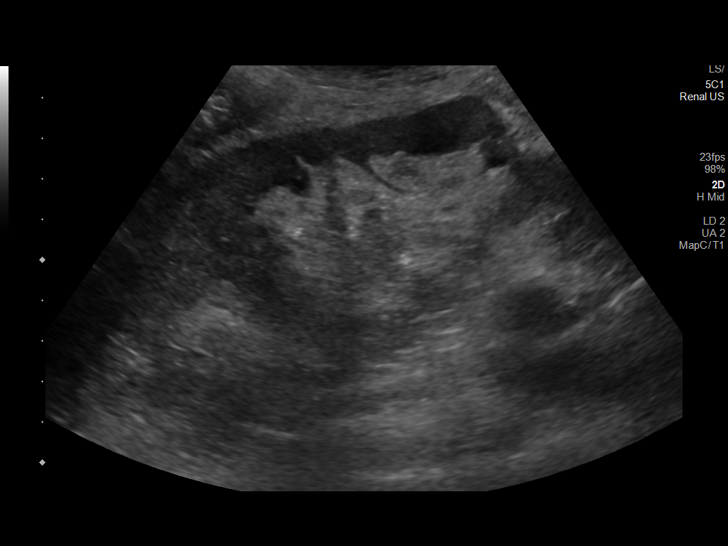
[im 3/27]
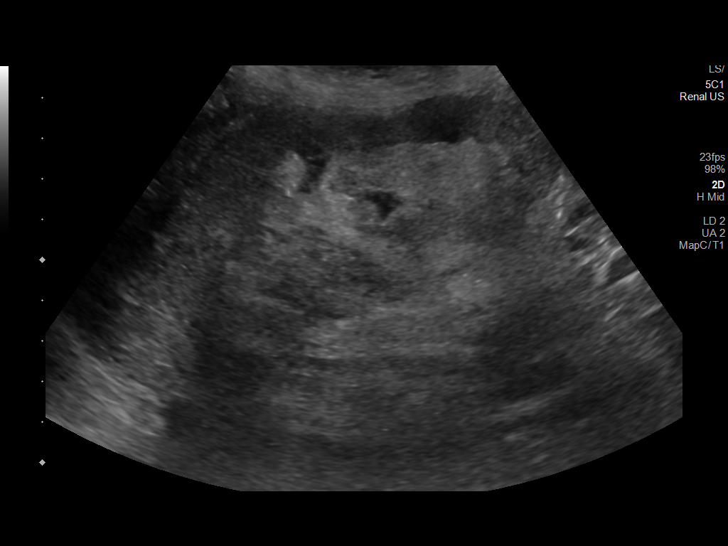
[im 5/27]
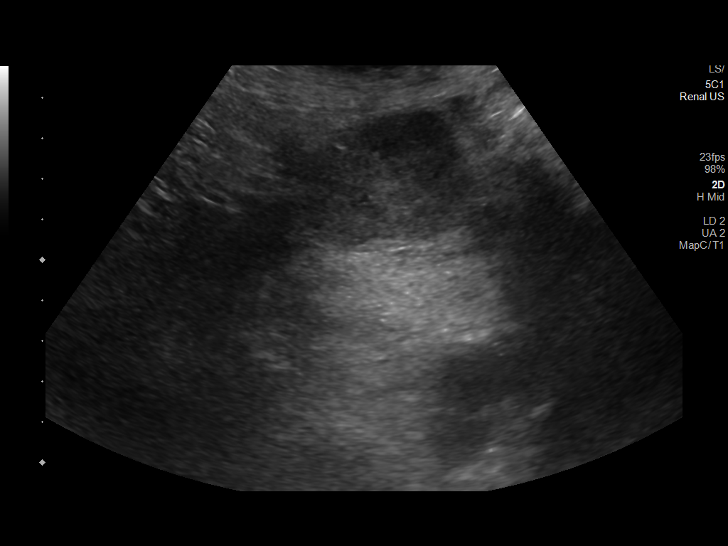
[im 7/27]
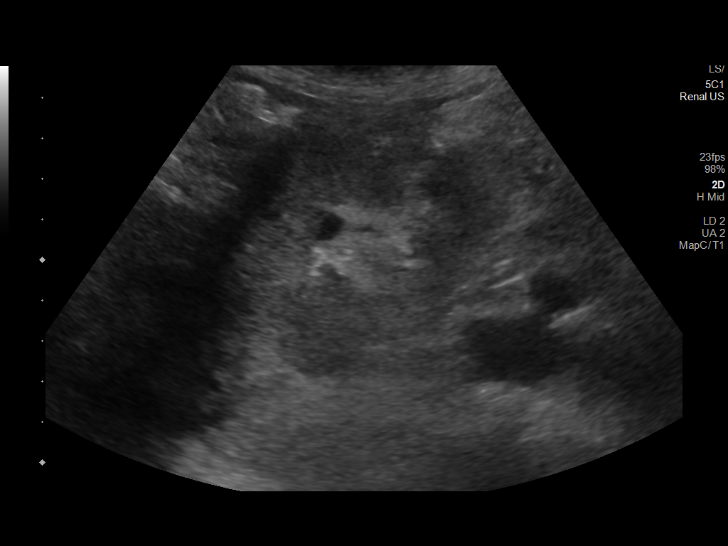
[im 9/27]
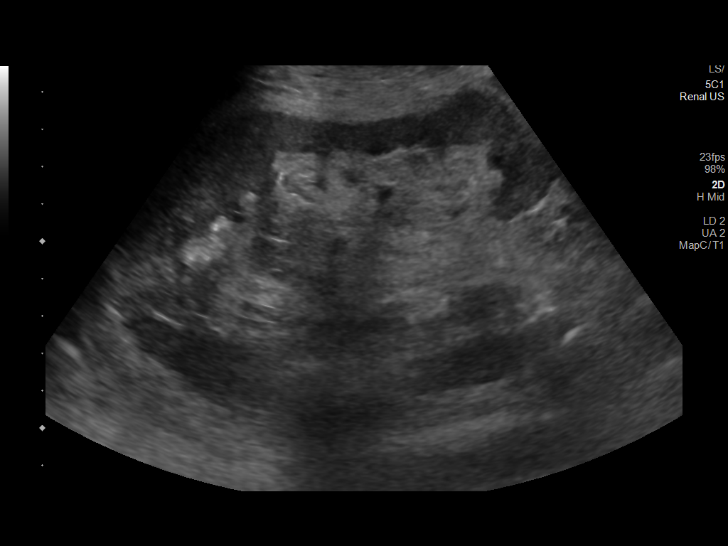
[im 10/27]
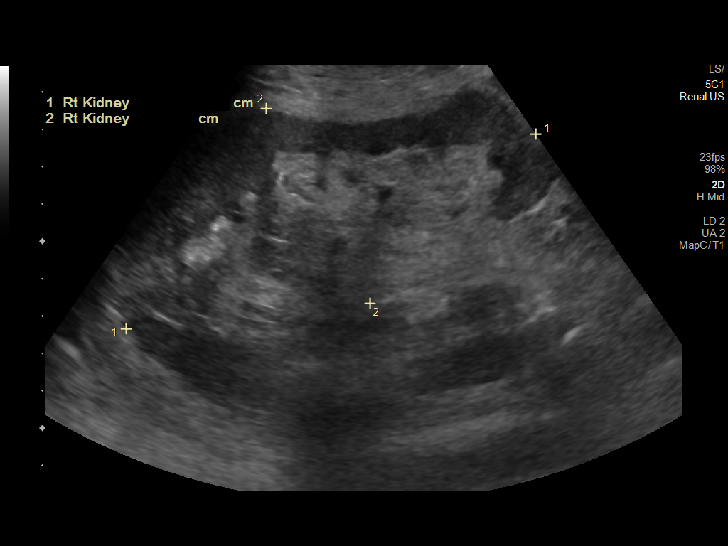
[im 12/27]
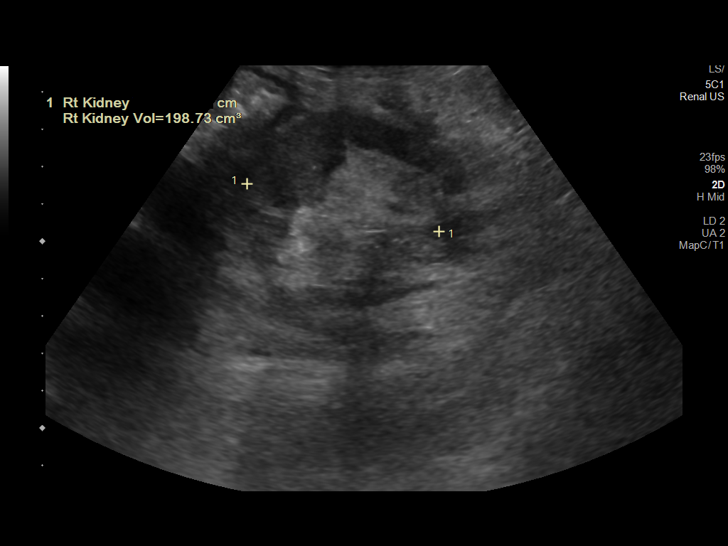
[im 15/27]
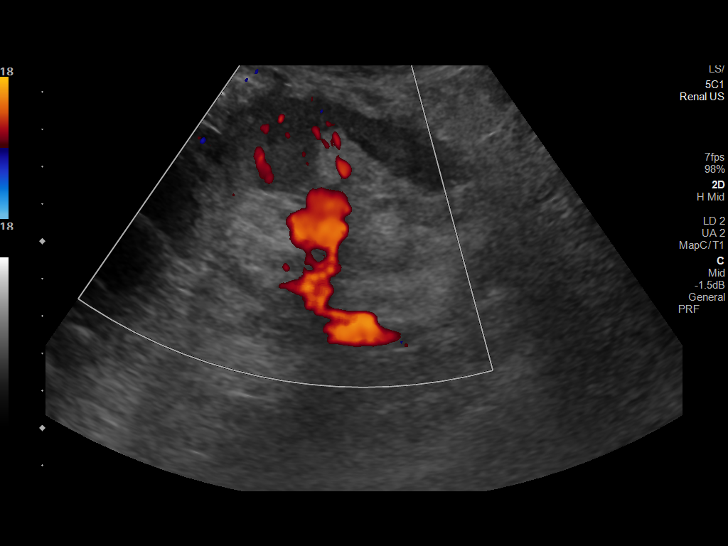
[im 17/27]
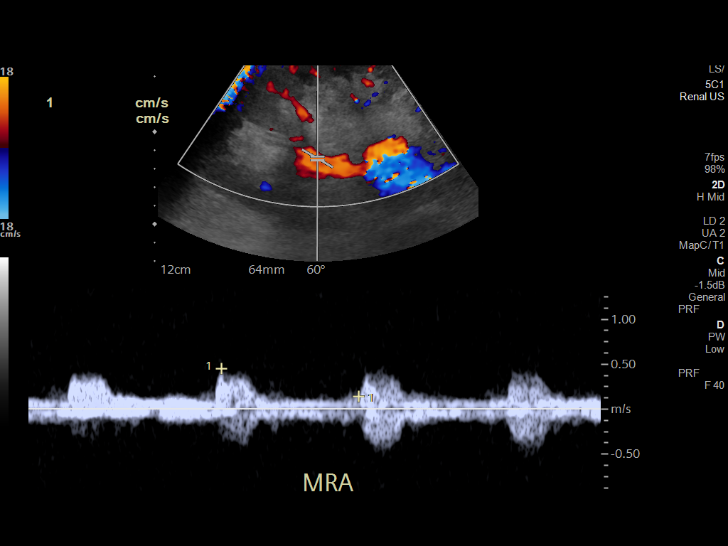
[im 18/27]
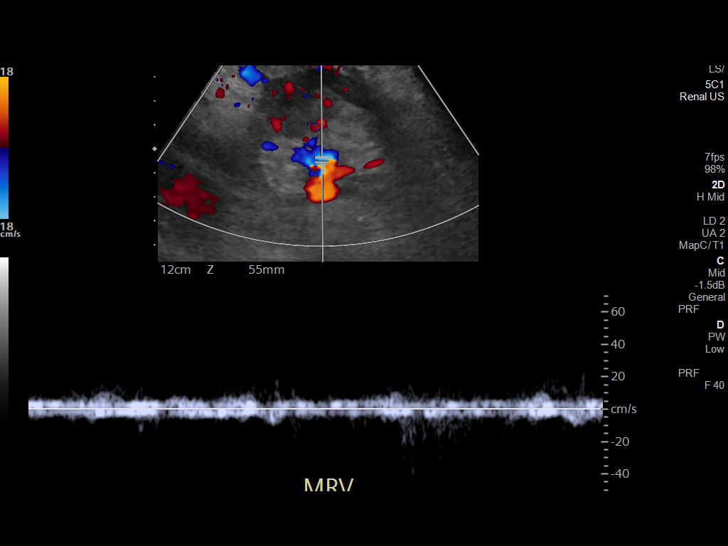
[im 20/27]
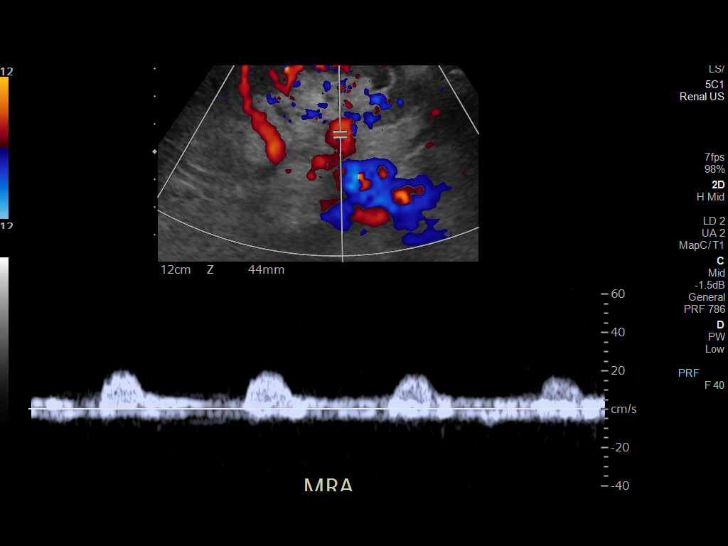
[im 22/27]
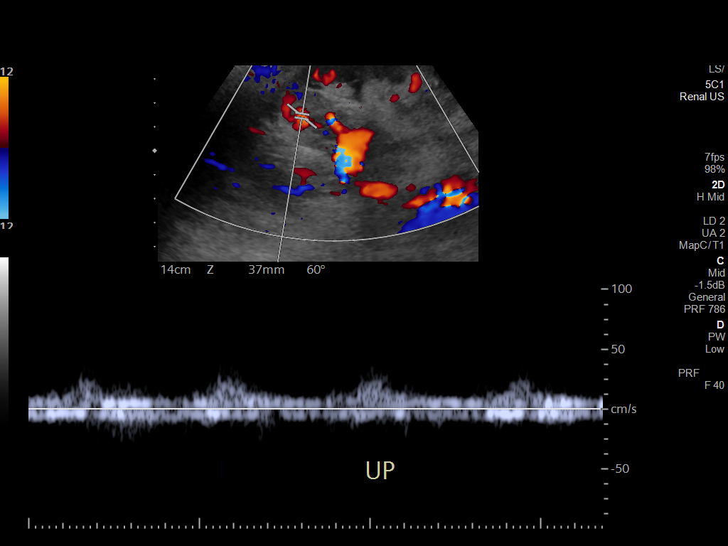
[im 24/27]
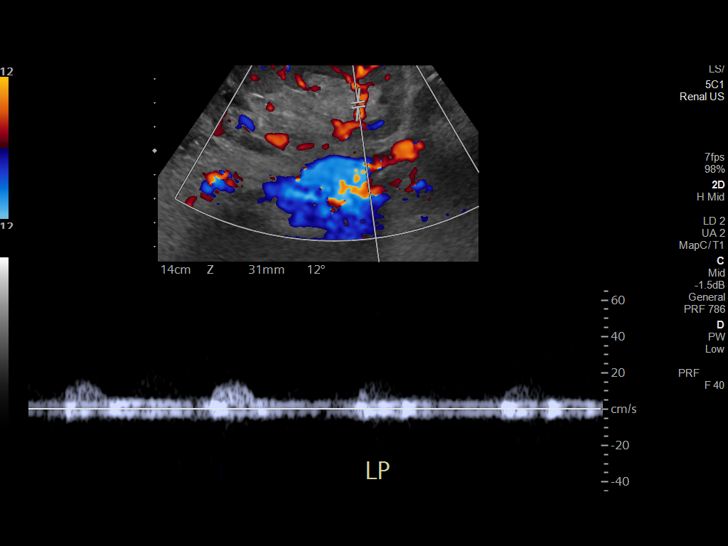
[im 27/27]
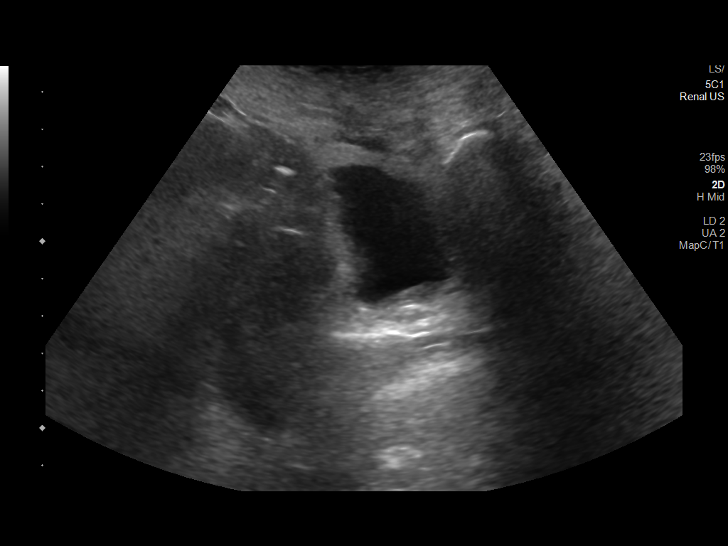

[14 of 25 positions shown; findings below may reference images not displayed]

FINDINGS: Transplant kidney location: RLQ

Transplant Kidney:

Renal measurements: 12.1 x 5.9 x 5.3 cm = volume: 199mL. Normal in
size and parenchymal echogenicity. No evidence of mass or
hydronephrosis. No peri-transplant fluid collection seen.

Color flow in the main renal artery:  Yes

Color flow in the main renal vein:  Yes

Duplex Doppler Evaluation:

Main Renal Artery Velocity: 21.5 cm/sec

Main Renal Artery Resistive Index:

Venous waveform in main renal vein:  Present

Intrarenal resistive index in upper pole:

(normal 0.6-0.8; equivocal 0.8-0.9; abnormal >= 0.9)

Intrarenal resistive index in lower pole:

(normal 0.6-0.8; equivocal 0.8-0.9; abnormal >= 0.9)

Bladder: Normal for degree of bladder distention. The ureteral jet
is not seen.

Other findings:  None.
IMPRESSION: 1. Right lower quadrant renal transplant without hydronephrosis.
2. Patent transplant vasculature.

## 2021-03-23 MED ORDER — SODIUM CHLORIDE 0.9% IV SOLUTION: Freq: Once | INTRAVENOUS | Status: AC

## 2021-03-23 NOTE — Progress Notes (Signed)
PT Cancellation Note  Patient Details Name: [name] MRN: [id number] DOB: Apr 11, 1946   Cancelled Treatment:    Reason Eval/Treat Not Completed: Medical issues which prohibited therapy Patient Hgb dropped to 6.9 this AM. Patient recently started on blood transfusion. Request to hold PT for now. PT will re-attempt as time allows.   Kodi Guerrera A. Gilford Rile PT, DPT Acute Rehabilitation Services Pager [phone] Office [phone]    Linna Hoff 03/23/2021, 3:19 PM

## 2021-03-23 NOTE — Progress Notes (Signed)
Subjective: Feeling well.  No complaints of any bleeding.  Objective: Vital signs in last 24 hours: Temp:  [97.6 F (36.4 C)-98.7 F (37.1 C)] 98.6 F (37 C) (09/08 1515) Pulse Rate:  [70-72] 71 (09/08 1522) Resp:  [17-20] 18 (09/08 1522) BP: (126-186)/(56-77) 126/66 (09/08 1522) SpO2:  [99 %-100 %] 100 % (09/08 1522) Weight:  [64 kg] 64 kg (09/08 0454) Last BM Date: 03/21/21  Intake/Output from previous day: 09/07 0701 - 09/08 0700 In: 1303.2 [P.O.:570; I.V.:733.2] Out: -  Intake/Output this shift: Total I/O In: 1681.9 [P.O.:120; I.V.:1561.9] Out: -   General appearance: alert and no distress GI: soft, non-tender; bowel sounds normal; no masses,  no organomegaly  Lab Results: Recent Labs    03/21/21 1215 03/21/21 1857 03/22/21 1755 03/23/21 0248 03/23/21 1002  WBC 20.8*  --   --  15.0* 14.7*  HGB 7.8*   < > 7.5* 7.0* 6.9*  HCT 24.1*   < > 23.5* 21.7* 21.0*  PLT 285  --   --  285 274   < > = values in this interval not displayed.   BMET Recent Labs    03/21/21 0157 03/22/21 0933 03/23/21 0248  NA 137 139 139  K 4.4 3.3* 3.8  CL 108 111 113*  CO2 20* 22 21*  GLUCOSE 99 117* 94  BUN 93* 78* 68*  CREATININE 4.07* 3.82* 3.66*  CALCIUM 8.1* 8.2* 8.2*   LFT Recent Labs    03/23/21 0248  PROT 4.6*  ALBUMIN 2.2*  AST 12*  ALT 9  ALKPHOS 42  BILITOT 0.7   PT/INR No results for input(s): LABPROT, INR in the last 72 hours. Hepatitis Panel No results for input(s): HEPBSAG, HCVAB, HEPAIGM, HEPBIGM in the last 72 hours. C-Diff No results for input(s): CDIFFTOX in the last 72 hours. Fecal Lactopherrin No results for input(s): FECLLACTOFRN in the last 72 hours.  Studies/Results: NM GI Blood Loss  Result Date: 03/21/2021 CLINICAL DATA:  GI bleed EXAM: NUCLEAR MEDICINE GASTROINTESTINAL BLEEDING SCAN TECHNIQUE: Sequential abdominal images were obtained following intravenous administration of Tc-23m labeled red blood cells. RADIOPHARMACEUTICALS:  24.5 mCi  Tc-87m pertechnetate in-vitro labeled red cells. COMPARISON:  None. FINDINGS: Imaging was continued for 2 hours. No evidence of radiopharmaceutical extravasation or accumulation to localize GI bleed. IMPRESSION: No visible GI bleed. Electronically Signed   By: Rolm Baptise M.D.   On: 03/21/2021 19:33    Medications: Scheduled:  sodium chloride   Intravenous Once   sodium chloride   Intravenous Once   acetaminophen  650 mg Oral Once   DULoxetine  30 mg Oral Daily   mometasone-formoterol  2 puff Inhalation BID   mycophenolate  180 mg Oral BID   pantoprazole  40 mg Intravenous Q12H   predniSONE  5 mg Oral Daily   sodium chloride flush  3 mL Intravenous Q12H   tacrolimus  1 mg Oral QHS   tacrolimus  2 mg Oral Daily   Continuous:  sodium chloride 40 mL/hr at 03/23/21 0830    Assessment/Plan: 1) Duodenal ulcer with visible vessel s/p ablation. 2) Diverticular with a history of diverticular bleed. 3) Anemia.   The patient does not report any further bleeding.  Her HGB did drift down mildly.  This may be as a result of a equilibration.  Plan: 1) Agree with the blood transfusion. 2) Advance to a regular diet. 3) Continue to monitor HGB.   4) ? D/C on Saturday.  LOS: 5 days   Rhonda Linan D 03/23/2021, 3:41  PM  

## 2021-03-23 NOTE — Progress Notes (Signed)
Date and time results received: 03/23/21 1109  Test: HGB Critical Value: 6.9  Name of Provider Notified: Dr. Fayrene Helper  Orders Received? Or Actions Taken?: Orders Received - See Orders for details

## 2021-03-23 NOTE — Progress Notes (Signed)
PROGRESS NOTE    [name]  IDP:[id number] DOB: 1945/11/14 DOA: 03/18/2021 PCP: [name], NP   Chief Complaint  Patient presents with   GI Bleeding   Hypotension   Brief Narrative: 75 yo with hx ESRD s/p renal transplant on chronic immunosuppression, atrial fibrillation s/p AV nodal ablation and PPM placemen/watchman procedure, prior PE/DVT/IVC filter c/b intracerebral hemorrhage 2010 off anticoagulation, prior lower GI bleeds with diverticulosis (follows with Dr. Collene Mares), HFpEF, OSA who presented with 1 week of frequent dark re stools, dark black stools on 9/3 and fainted.  She had recurrent syncope in the ED.  She's had recurrent bleeding in house, requiring 5 units pRBC at this point.  She had urgent endoscopy on 9/5 which showed 2 duodenal ulcers which were treated with epinephrine and BiCap cautery.   Assessment & Plan:   Principal Problem:   Acute GI bleeding Active Problems:   Asthmatic bronchitis without complication   Acute kidney injury superimposed on CKD (HCC)   Symptomatic anemia   Obstructive sleep apnea   Hyperkalemia   Persistent atrial fibrillation (HCC)   Immunosuppression (HCC)  GI Bleed  Duodenal Ulcers  Diverticulosis At this point, duodenal ulcer maybe most likely source, but also possible she's having lower GI bleeding and ulcers were incidental (see EGD report 9/5) S/p treatment of duodenal ulcer with visible vessel by dilute epinephrine injection and bicap cautery Tagged RBC scan negative Continue PPI gtt  Will advance diet as tolerated Needs to avoid NSAIDs in the future GI c/s, appreciate recommendations - ? D/c Saturday?  Acute Blood Loss Anemia S/p 5 units pRBC Additional 1 unit today Hb relatively stable today with gradual downtrend, suspect equilibration  Follow and transfuse for <7 or symptomatic  Leukocytosis Afebrile, stress related? Downtrending, gradually - follow  She's on chronic steroids, though low dose - 5  mg Continue to monitor, w/u additionally as indicated  ESRD s/p Transplant Acute Kidney Injury on CKD IV Baseline creatinine appears to be ~2.3 Presented with creatinine around 4.9 -> peaked to 5.1 Improving at this time Follow UA - notable for many bacteria, 21-50 WBC, large LE Follow renal US  Continue mycophenolate, prograf, prednisone Thought related to ATN due to GI bleed per prior MD discussion with renal  Pyuria She hasn't c/o clear symptoms - will need to discuss prior to decision on treatment  Atrial fibrillation s/p AV nodal ablation  PPM  Watchman No longer on anticoagulation in setting of ICH  Hx DVT and PE  S/P IVC filter Not on anticoagulation due to hx of ICH  Syncope 2/2 ABLA  Hypertension Holding BP meds in setting of above  Depression Continue cymbalat  DVT prophylaxis:SCD Code Status: full  Family Communication: husband over phone Disposition:   Status is: Inpatient  Remains inpatient appropriate because:Inpatient level of care appropriate due to severity of illness  Dispo: The patient is from: Home              Anticipated d/c is to: Home              Patient currently is not medically stable to d/c.   Difficult to place patient No       Consultants:  GI  Procedures: Impression - There were two ulcers in the second segment of the duodenum. One was cratered, linear, 1cm long and had Wilberta Dorvil small visible vessel. The other was round, fairly superfical, without concerning features. I treated the ulcer with the visible vessel by dilute epinephrine injection  and then BiCap cautery with 7Fr Gold probe. - The examination was otherwise normal. Recommendation - The ulcer with the visible vessel is probably the source of her recent bleeding but it was not very large and the visible vessel was pretty small and there was no blood in the UGI tract. I think it is therefore still possible that she's been having lower GI bleeding and that these ulcers  were incidental. Regardless, I will keep her on clears today, IV PPI drip to continue. Should follow her clinically and transfuse blood products if needed - Drs. Collene Mares and Benson Norway will assume her care in the morning.   Antimicrobials:  Anti-infectives (From admission, onward)    None          Subjective: No complaints No BM yet - passing gas  Objective: Vitals:   03/23/21 1453 03/23/21 1515 03/23/21 1522 03/23/21 1738  BP: (!) 182/77 (!) 186/75 126/66 (!) 149/74  Pulse: 72 70 71 70  Resp: 17 20 18  (!) 22  Temp: 98.6 F (37 C) 98.6 F (37 C) 98 F (36.7 C) 98.6 F (37 C)  TempSrc: Axillary Axillary Oral Axillary  SpO2: 100% 100% 100% 100%  Weight:      Height:        Intake/Output Summary (Last 24 hours) at 03/23/2021 1758 Last data filed at 03/23/2021 1738 Gross per 24 hour  Intake 2069.4 ml  Output --  Net 2069.4 ml   Filed Weights   03/21/21 0500 03/22/21 0532 03/23/21 0454  Weight: 62.5 kg 62.4 kg 64 kg    Examination:  General: No acute distress. Cardiovascular: RRR Lungs: unlabored Abdomen: Soft, nontender, nondistended  Neurological: Alert and oriented 3. Moves all extremities 4. Cranial nerves II through XII grossly intact. Skin: Warm and dry. No rashes or lesions. Extremities: No clubbing or cyanosis. No edema.  Data Reviewed: I have personally reviewed following labs and imaging studies  CBC: Recent Labs  Lab 03/18/21 2036 03/18/21 2242 03/19/21 1600 03/20/21 0358 03/20/21 1815 03/21/21 0157 03/21/21 1215 03/21/21 1857 03/22/21 0138 03/22/21 0933 03/22/21 1755 03/23/21 0248 03/23/21 1002  WBC 24.9*   < > 17.1*   < > 24.2* 21.0* 20.8*  --   --   --   --  15.0* 14.7*  NEUTROABS 21.7*  --  16.6*  --   --   --  17.6*  --   --   --   --  11.8* 11.9*  HGB 7.1*   < > 6.9*   < > 9.9* 8.3* 7.8*   < > 7.7* 7.3* 7.5* 7.0* 6.9*  HCT 22.6*   < > 21.3*   < > 29.2* 25.3* 24.1*   < > 22.7* 22.1* 23.5* 21.7* 21.0*  MCV 85.0   < > 86.2   < > 90.1 90.0  91.6  --   --   --   --  94.3 93.3  PLT 511*   < > 396   < > 296 282 285  --   --   --   --  285 274   < > = values in this interval not displayed.    Basic Metabolic Panel: Recent Labs  Lab 03/19/21 0800 03/20/21 0359 03/21/21 0157 03/22/21 0933 03/23/21 0248  NA QUANTITY NOT SUFFICIENT, UNABLE TO PERFORM TEST 139 137 139 139  K QUANTITY NOT SUFFICIENT, UNABLE TO PERFORM TEST 4.8 4.4 3.3* 3.8  CL QUANTITY NOT SUFFICIENT, UNABLE TO PERFORM TEST 107 108 111 113*  CO2 QUANTITY  NOT SUFFICIENT, UNABLE TO PERFORM TEST 22 20* 22 21*  GLUCOSE QUANTITY NOT SUFFICIENT, UNABLE TO PERFORM TEST 102* 99 117* 94  BUN QUANTITY NOT SUFFICIENT, UNABLE TO PERFORM TEST 99* 93* 78* 68*  CREATININE QUANTITY NOT SUFFICIENT, UNABLE TO PERFORM TEST 4.21* 4.07* 3.82* 3.66*  CALCIUM QUANTITY NOT SUFFICIENT, UNABLE TO PERFORM TEST 8.3* 8.1* 8.2* 8.2*  MG  --   --   --   --  1.9  PHOS  --   --  5.1*  --  4.6    GFR: Estimated Creatinine Clearance: 12.5 mL/min (Markeshia Giebel) (by C-G formula based on SCr of 3.66 mg/dL (H)).  Liver Function Tests: Recent Labs  Lab 03/18/21 2036 03/20/21 0359 03/21/21 0157 03/23/21 0248  AST 10* 11*  --  12*  ALT 10 9  --  9  ALKPHOS 87 49  --  42  BILITOT 0.5 0.8  --  0.7  PROT 5.3* 4.3*  --  4.6*  ALBUMIN 2.4* 2.0* 2.2* 2.2*    CBG: No results for input(s): GLUCAP in the last 168 hours.   No results found for this or any previous visit (from the past 240 hour(s)).       Radiology Studies: No results found.      Scheduled Meds:  sodium chloride   Intravenous Once   acetaminophen  650 mg Oral Once   DULoxetine  30 mg Oral Daily   mometasone-formoterol  2 puff Inhalation BID   mycophenolate  180 mg Oral BID   pantoprazole  40 mg Intravenous Q12H   predniSONE  5 mg Oral Daily   sodium chloride flush  3 mL Intravenous Q12H   tacrolimus  1 mg Oral QHS   tacrolimus  2 mg Oral Daily   Continuous Infusions:  sodium chloride 75 mL/hr at 03/23/21 0830      LOS: 5 days    Time spent: over 30 min    Fayrene Helper, MD [facility]   To contact the attending provider between 7A-7P or the covering provider during after hours 7P-7A, please log into the web site [email or web address] and access using universal [place] password for that web site. If you do not have the password, please call the hospital operator.  03/23/2021, 5:58 PM

## 2021-03-24 ENCOUNTER — Telehealth: Payer: Self-pay | Admitting: Nurse Practitioner

## 2021-03-24 LAB — CBC WITH DIFFERENTIAL/PLATELET
Abs Immature Granulocytes: 0.54 10*3/uL — ABNORMAL HIGH (ref 0.00–0.07)
Basophils Absolute: 0.1 10*3/uL (ref 0.0–0.1)
Basophils Relative: 1 %
Eosinophils Absolute: 0.1 10*3/uL (ref 0.0–0.5)
Eosinophils Relative: 1 %
HCT: 25.9 % — ABNORMAL LOW (ref 36.0–46.0)
Hemoglobin: 8.3 g/dL — ABNORMAL LOW (ref 12.0–15.0)
Immature Granulocytes: 3 %
Lymphocytes Relative: 6 %
Lymphs Abs: 1 10*3/uL (ref 0.7–4.0)
MCH: 29.7 pg (ref 26.0–34.0)
MCHC: 32 g/dL (ref 30.0–36.0)
MCV: 92.8 fL (ref 80.0–100.0)
Monocytes Absolute: 1.5 10*3/uL — ABNORMAL HIGH (ref 0.1–1.0)
Monocytes Relative: 9 %
Neutro Abs: 13 10*3/uL — ABNORMAL HIGH (ref 1.7–7.7)
Neutrophils Relative %: 80 %
Platelets: 275 10*3/uL (ref 150–400)
RBC: 2.79 MIL/uL — ABNORMAL LOW (ref 3.87–5.11)
RDW: 18.1 % — ABNORMAL HIGH (ref 11.5–15.5)
WBC: 16.3 10*3/uL — ABNORMAL HIGH (ref 4.0–10.5)
nRBC: 0.2 % (ref 0.0–0.2)

## 2021-03-24 LAB — COMPREHENSIVE METABOLIC PANEL
ALT: 9 U/L (ref 0–44)
AST: 10 U/L — ABNORMAL LOW (ref 15–41)
Albumin: 2.2 g/dL — ABNORMAL LOW (ref 3.5–5.0)
Alkaline Phosphatase: 49 U/L (ref 38–126)
Anion gap: 9 (ref 5–15)
BUN: 52 mg/dL — ABNORMAL HIGH (ref 8–23)
CO2: 16 mmol/L — ABNORMAL LOW (ref 22–32)
Calcium: 8.1 mg/dL — ABNORMAL LOW (ref 8.9–10.3)
Chloride: 114 mmol/L — ABNORMAL HIGH (ref 98–111)
Creatinine, Ser: 3.26 mg/dL — ABNORMAL HIGH (ref 0.44–1.00)
GFR, Estimated: 14 mL/min — ABNORMAL LOW (ref >60)
Glucose, Bld: 93 mg/dL (ref 70–99)
Potassium: 3.4 mmol/L — ABNORMAL LOW (ref 3.5–5.1)
Sodium: 139 mmol/L (ref 135–145)
Total Bilirubin: 0.6 mg/dL (ref 0.3–1.2)
Total Protein: 4.4 g/dL — ABNORMAL LOW (ref 6.5–8.1)

## 2021-03-24 LAB — BPAM RBC
Blood Product Expiration Date: 202210112359
ISSUE DATE / TIME: 202209081452
Unit Type and Rh: 5100

## 2021-03-24 LAB — TYPE AND SCREEN
ABO/RH(D): O POS
Antibody Screen: NEGATIVE
Unit division: 0

## 2021-03-24 LAB — HEMOGLOBIN AND HEMATOCRIT, BLOOD
HCT: 25.8 % — ABNORMAL LOW (ref 36.0–46.0)
HCT: 27.7 % — ABNORMAL LOW (ref 36.0–46.0)
Hemoglobin: 8.4 g/dL — ABNORMAL LOW (ref 12.0–15.0)
Hemoglobin: 9 g/dL — ABNORMAL LOW (ref 12.0–15.0)

## 2021-03-24 LAB — PHOSPHORUS: Phosphorus: 4.6 mg/dL (ref 2.5–4.6)

## 2021-03-24 LAB — MAGNESIUM: Magnesium: 1.7 mg/dL (ref 1.7–2.4)

## 2021-03-24 NOTE — Telephone Encounter (Signed)
Pts husband called, [name] has been at [facility] for 7 days, admitted for bleeding in the rectum. They are very concerned with the care they are receiving (says it's not up to par). They would like to speak with [facility].

## 2021-03-24 NOTE — Progress Notes (Signed)
PROGRESS NOTE    [name]  MPN:[id number] DOB: 03-20-1946 DOA: 03/18/2021 PCP: [name], NP   Chief Complaint  Patient presents with   GI Bleeding   Hypotension   Brief Narrative: 75 yo with hx ESRD s/p renal transplant on chronic immunosuppression, atrial fibrillation s/p AV nodal ablation and PPM placemen/watchman procedure, prior PE/DVT/IVC filter c/b intracerebral hemorrhage 2010 off anticoagulation, prior lower GI bleeds with diverticulosis (follows with Dr. Collene Mares), HFpEF, OSA who presented with 1 week of frequent dark re stools, dark black stools on 9/3 and fainted.  She had recurrent syncope in the ED.  She's had recurrent bleeding in house, requiring 5 units pRBC at this point.  She had urgent endoscopy on 9/5 which showed 2 duodenal ulcers which were treated with epinephrine and BiCap cautery.   Assessment & Plan:   Principal Problem:   Acute GI bleeding Active Problems:   Asthmatic bronchitis without complication   Acute kidney injury superimposed on CKD (HCC)   Symptomatic anemia   Obstructive sleep apnea   Hyperkalemia   Persistent atrial fibrillation (HCC)   Immunosuppression (HCC)  GI Bleed  Duodenal Ulcers  Diverticulosis At this point, duodenal ulcer maybe most likely source, but also possible she's having lower GI bleeding and ulcers were incidental (see EGD report 9/5) S/p treatment of duodenal ulcer with visible vessel by dilute epinephrine injection and bicap cautery Tagged RBC scan negative Maroon stool again last night per pt Appropriate bump after transfusion yesterday -> will continue to trend Continue PPI BID (s/p gtt)  Will advance diet as tolerated Needs to avoid NSAIDs in the future GI c/s, appreciate recommendations - ? D/c Saturday?  Acute Blood Loss Anemia S/p 6 units pRBC Appropriate bump in Hb today, continue to trend Follow and transfuse for <7 or symptomatic  Leukocytosis Afebrile, stress related? Downtrending, gradually  - follow  She's on chronic steroids, though low dose - 5 mg - doubt this is cause Continue to monitor, w/u additionally as indicated  ESRD s/p Transplant Acute Kidney Injury on CKD IV Baseline creatinine appears to be ~2.3 Presented with creatinine around 4.9 -> peaked to 5.1 Improving at this time Follow UA - notable for many bacteria, 21-50 WBC, large LE Follow renal US -> no hydro, patent vasculature  Continue mycophenolate, prograf, prednisone Thought related to ATN due to GI bleed per prior MD discussion with renal  Pyuria She hasn't c/o clear symptoms - will need to discuss prior to decision on treatment  Atrial fibrillation s/p AV nodal ablation  PPM  Watchman No longer on anticoagulation in setting of ICH  Hx DVT and PE  S/P IVC filter Not on anticoagulation due to hx of ICH  Syncope 2/2 ABLA  Hypertension Holding BP meds in setting of above  Depression Continue cymbalat  DVT prophylaxis:SCD Code Status: full  Family Communication: husband at bedside Disposition:   Status is: Inpatient  Remains inpatient appropriate because:Inpatient level of care appropriate due to severity of illness  Dispo: The patient is from: Home              Anticipated d/c is to: Home              Patient currently is not medically stable to d/c.   Difficult to place patient No       Consultants:  GI  Procedures: Impression - There were two ulcers in the second segment of the duodenum. One was cratered, linear, 1cm long and had Carina Chaplin small  visible vessel. The other was round, fairly superfical, without concerning features. I treated the ulcer with the visible vessel by dilute epinephrine injection and then BiCap cautery with 7Fr Gold probe. - The examination was otherwise normal. Recommendation - The ulcer with the visible vessel is probably the source of her recent bleeding but it was not very large and the visible vessel was pretty small and there was no blood in the UGI  tract. I think it is therefore still possible that she's been having lower GI bleeding and that these ulcers were incidental. Regardless, I will keep her on clears today, IV PPI drip to continue. Should follow her clinically and transfuse blood products if needed - Drs. Collene Mares and Benson Norway will assume her care in the morning.   Antimicrobials:  Anti-infectives (From admission, onward)    None          Subjective: Passed maroon stool last night  Objective: Vitals:   03/24/21 0303 03/24/21 0448 03/24/21 0803 03/24/21 0929  BP: (!) 144/74  (!) 141/64 (!) 141/64  Pulse: 70  69 72  Resp: 19  18 (!) 22  Temp: 98.6 F (37 C)  98.7 F (37.1 C)   TempSrc: Oral  Oral   SpO2: 98%  99% 100%  Weight:  65.9 kg    Height:        Intake/Output Summary (Last 24 hours) at 03/24/2021 0956 Last data filed at 03/23/2021 1738 Gross per 24 hour  Intake 464.66 ml  Output --  Net 464.66 ml   Filed Weights   03/22/21 0532 03/23/21 0454 03/24/21 0448  Weight: 62.4 kg 64 kg 65.9 kg    Examination:  General: No acute distress. Cardiovascular: RRR Lungs: unlabored Abdomen: Soft, nontender, nondistended  Neurological: Alert and oriented 3. Moves all extremities 4 . Cranial nerves II through XII grossly intact. Skin: Warm and dry. No rashes or lesions. Extremities: No clubbing or cyanosis. No edema.    Data Reviewed: I have personally reviewed following labs and imaging studies  CBC: Recent Labs  Lab 03/19/21 1600 03/20/21 0358 03/21/21 0157 03/21/21 1215 03/21/21 1857 03/22/21 1755 03/23/21 0248 03/23/21 1002 03/23/21 2038 03/24/21 0213  WBC 17.1*   < > 21.0* 20.8*  --   --  15.0* 14.7*  --  16.3*  NEUTROABS 16.6*  --   --  17.6*  --   --  11.8* 11.9*  --  13.0*  HGB 6.9*   < > 8.3* 7.8*   < > 7.5* 7.0* 6.9* 9.2* 8.3*  HCT 21.3*   < > 25.3* 24.1*   < > 23.5* 21.7* 21.0* 28.1* 25.9*  MCV 86.2   < > 90.0 91.6  --   --  94.3 93.3  --  92.8  PLT 396   < > 282 285  --   --  285 274   --  275   < > = values in this interval not displayed.    Basic Metabolic Panel: Recent Labs  Lab 03/20/21 0359 03/21/21 0157 03/22/21 0933 03/23/21 0248 03/24/21 0213  NA 139 137 139 139 139  K 4.8 4.4 3.3* 3.8 3.4*  CL 107 108 111 113* 114*  CO2 22 20* 22 21* 16*  GLUCOSE 102* 99 117* 94 93  BUN 99* 93* 78* 68* 52*  CREATININE 4.21* 4.07* 3.82* 3.66* 3.26*  CALCIUM 8.3* 8.1* 8.2* 8.2* 8.1*  MG  --   --   --  1.9 1.7  PHOS  --  5.1*  --  4.6 4.6    GFR: Estimated Creatinine Clearance: 14 mL/min (Euline Kimbler) (by C-G formula based on SCr of 3.26 mg/dL (H)).  Liver Function Tests: Recent Labs  Lab 03/18/21 2036 03/20/21 0359 03/21/21 0157 03/23/21 0248 03/24/21 0213  AST 10* 11*  --  12* 10*  ALT 10 9  --  9 9  ALKPHOS 87 49  --  42 49  BILITOT 0.5 0.8  --  0.7 0.6  PROT 5.3* 4.3*  --  4.6* 4.4*  ALBUMIN 2.4* 2.0* 2.2* 2.2* 2.2*    CBG: No results for input(s): GLUCAP in the last 168 hours.   No results found for this or any previous visit (from the past 240 hour(s)).       Radiology Studies: US Renal Transplant w/Doppler  Result Date: 03/23/2021 CLINICAL DATA:  Acute kidney injury. EXAM: ULTRASOUND OF RENAL TRANSPLANT WITH RENAL DOPPLER ULTRASOUND TECHNIQUE: Ultrasound examination of the renal transplant was performed with gray-scale, color and duplex doppler evaluation. COMPARISON:  Transplant ultrasound 03/11/2020 FINDINGS: Transplant kidney location: RLQ Transplant Kidney: Renal measurements: 12.1 x 5.9 x 5.3 cm = volume: 159mL. Normal in size and parenchymal echogenicity. No evidence of mass or hydronephrosis. No peri-transplant fluid collection seen. Color flow in the main renal artery:  Yes Color flow in the main renal vein:  Yes Duplex Doppler Evaluation: Main Renal Artery Velocity: 21.5 cm/sec Main Renal Artery Resistive Index: 0.7 Venous waveform in main renal vein:  Present Intrarenal resistive index in upper pole:  0.6 (normal 0.6-0.8; equivocal 0.8-0.9;  abnormal >= 0.9) Intrarenal resistive index in lower pole: 0.65 (normal 0.6-0.8; equivocal 0.8-0.9; abnormal >= 0.9) Bladder: Normal for degree of bladder distention. The ureteral jet is not seen. Other findings:  None. IMPRESSION: 1. Right lower quadrant renal transplant without hydronephrosis. 2. Patent transplant vasculature. Electronically Signed   By: Keith Rake M.D.   On: 03/23/2021 21:13        Scheduled Meds:  sodium chloride   Intravenous Once   acetaminophen  650 mg Oral Once   DULoxetine  30 mg Oral Daily   mometasone-formoterol  2 puff Inhalation BID   mycophenolate  180 mg Oral BID   pantoprazole  40 mg Intravenous Q12H   predniSONE  5 mg Oral Daily   sodium chloride flush  3 mL Intravenous Q12H   tacrolimus  1 mg Oral QHS   tacrolimus  2 mg Oral Daily   Continuous Infusions:  sodium chloride 75 mL/hr at 03/23/21 2113     LOS: 6 days    Time spent: over 30 min    Fayrene Helper, MD [facility]   To contact the attending provider between 7A-7P or the covering provider during after hours 7P-7A, please log into the web site [email or web address] and access using universal [place] password for that web site. If you do not have the password, please call the hospital operator.  03/24/2021, 9:56 AM

## 2021-03-24 NOTE — Progress Notes (Signed)
Physical Therapy Treatment Patient Details Name: [name] MRN: [id number] DOB: [birth year] Today's Date: 03/24/2021    History of Present Illness 75 yo female admitted with GIB duodenal ulcers s/p BICAP of visible vessel and anemia. PMH ESRD s/p renal transplant 2017 on chronic immunosuppression, Afib, PPM COVID 8/27-8/29 admission, PE / DVT/ IVC complicated by [place] 9562, diverticulosis 11/2019 depression    PT Comments    Patient continues to be limited fatigue and weakness. Patient requires min guard for ambulation with RW. Patient continues to progress but requires encouragement to perform mobility. Encouraged use of BSC or ambulating to bathroom to urinate, patient verbalized understanding. Continue to recommend HHPT following discharge.     Follow Up Recommendations  Home health PT;Supervision for mobility/OOB     Equipment Recommendations  Rolling Latunya Kissick with 5" wheels    Recommendations for Other Services       Precautions / Restrictions Precautions Precautions: Fall Precaution Comments: SBP <160 Restrictions Weight Bearing Restrictions: No    Mobility  Bed Mobility Overal bed mobility: Needs Assistance Bed Mobility: Supine to Sit     Supine to sit: Supervision     General bed mobility comments: supervision for safety    Transfers Overall transfer level: Needs assistance Equipment used: Rolling Demetra Moya (2 wheeled) Transfers: Sit to/from Stand Sit to Stand: Supervision         General transfer comment: supervision for safety  Ambulation/Gait Ambulation/Gait assistance: Min guard;Supervision Gait Distance (Feet): 75 Feet Assistive device: Rolling Matricia Begnaud (2 wheeled) Gait Pattern/deviations: Step-through pattern;Decreased stride length;Trunk flexed Gait velocity: decreased   General Gait Details: slow pace with use of RW. No LOB noted but reports fatigueand mild dizziness. VSS   Stairs             Wheelchair Mobility    Modified Rankin  (Stroke Patients Only)       Balance Overall balance assessment: Mild deficits observed, not formally tested                                          Cognition Arousal/Alertness: Awake/alert Behavior During Therapy: WFL for tasks assessed/performed Overall Cognitive Status: Within Functional Limits for tasks assessed                                 General Comments: WFL for simple tasks assessed. Will continue to assess      Exercises      General Comments        Pertinent Vitals/Pain Pain Assessment: No/denies pain    Home Living                      Prior Function            PT Goals (current goals can now be found in the care plan section) Acute Rehab PT Goals Patient Stated Goal: to go home with home health PT Goal Formulation: With patient Time For Goal Achievement: 04/05/21 Potential to Achieve Goals: Good Progress towards PT goals: Progressing toward goals    Frequency    Min 3X/week      PT Plan Current plan remains appropriate    Co-evaluation              AM-PAC PT "6 Clicks" Mobility   Outcome Measure  Help needed turning from your back  to your side while in a flat bed without using bedrails?: A Little Help needed moving from lying on your back to sitting on the side of a flat bed without using bedrails?: A Little Help needed moving to and from a bed to a chair (including a wheelchair)?: A Little Help needed standing up from a chair using your arms (e.g., wheelchair or bedside chair)?: A Little Help needed to walk in hospital room?: A Little Help needed climbing 3-5 steps with a railing? : A Little 6 Click Score: 18    End of Session Equipment Utilized During Treatment: Gait belt Activity Tolerance: Patient tolerated treatment well Patient left: in chair;with call bell/phone within reach;with chair alarm set Nurse Communication: Mobility status PT Visit Diagnosis: Unsteadiness on feet  (R26.81);Muscle weakness (generalized) (M62.81)     Time: 3893-7342 PT Time Calculation (min) (ACUTE ONLY): 25 min  Charges:  $Gait Training: 23-37 mins                     [name] A. Gilford Rile PT, DPT Acute Rehabilitation Services Pager [phone] Office [phone]    [name] 03/24/2021, 4:30 PM

## 2021-03-24 NOTE — Progress Notes (Signed)
Subjective: One maroon stool last evening.  Objective: Vital signs in last 24 hours: Temp:  [97.5 F (36.4 C)-98.7 F (37.1 C)] 98.7 F (37.1 C) (09/09 0803) Pulse Rate:  [69-73] 72 (09/09 0929) Resp:  [17-22] 22 (09/09 0929) BP: (126-186)/(64-77) 141/64 (09/09 0929) SpO2:  [98 %-100 %] 100 % (09/09 0929) Weight:  [65.9 kg] 65.9 kg (09/09 0448) Last BM Date: 03/21/21  Intake/Output from previous day: 09/08 0701 - 09/09 0700 In: 2146.6 [P.O.:120; I.V.:1639.1; Blood:387.5] Out: -  Intake/Output this shift: No intake/output data recorded.  General appearance: alert and no distress GI: soft, non-tender; bowel sounds normal; no masses,  no organomegaly  Lab Results: Recent Labs    03/23/21 0248 03/23/21 1002 03/23/21 2038 03/24/21 0213  WBC 15.0* 14.7*  --  16.3*  HGB 7.0* 6.9* 9.2* 8.3*  HCT 21.7* 21.0* 28.1* 25.9*  PLT 285 274  --  275   BMET Recent Labs    03/22/21 0933 03/23/21 0248 03/24/21 0213  NA 139 139 139  K 3.3* 3.8 3.4*  CL 111 113* 114*  CO2 22 21* 16*  GLUCOSE 117* 94 93  BUN 78* 68* 52*  CREATININE 3.82* 3.66* 3.26*  CALCIUM 8.2* 8.2* 8.1*   LFT Recent Labs    03/24/21 0213  PROT 4.4*  ALBUMIN 2.2*  AST 10*  ALT 9  ALKPHOS 49  BILITOT 0.6   PT/INR No results for input(s): LABPROT, INR in the last 72 hours. Hepatitis Panel No results for input(s): HEPBSAG, HCVAB, HEPAIGM, HEPBIGM in the last 72 hours. C-Diff No results for input(s): CDIFFTOX in the last 72 hours. Fecal Lactopherrin No results for input(s): FECLLACTOFRN in the last 72 hours.  Studies/Results: US Renal Transplant w/Doppler  Result Date: 03/23/2021 CLINICAL DATA:  Acute kidney injury. EXAM: ULTRASOUND OF RENAL TRANSPLANT WITH RENAL DOPPLER ULTRASOUND TECHNIQUE: Ultrasound examination of the renal transplant was performed with gray-scale, color and duplex doppler evaluation. COMPARISON:  Transplant ultrasound 03/11/2020 FINDINGS: Transplant kidney location: RLQ  Transplant Kidney: Renal measurements: 12.1 x 5.9 x 5.3 cm = volume: 135mL. Normal in size and parenchymal echogenicity. No evidence of mass or hydronephrosis. No peri-transplant fluid collection seen. Color flow in the main renal artery:  Yes Color flow in the main renal vein:  Yes Duplex Doppler Evaluation: Main Renal Artery Velocity: 21.5 cm/sec Main Renal Artery Resistive Index: 0.7 Venous waveform in main renal vein:  Present Intrarenal resistive index in upper pole:  0.6 (normal 0.6-0.8; equivocal 0.8-0.9; abnormal >= 0.9) Intrarenal resistive index in lower pole: 0.65 (normal 0.6-0.8; equivocal 0.8-0.9; abnormal >= 0.9) Bladder: Normal for degree of bladder distention. The ureteral jet is not seen. Other findings:  None. IMPRESSION: 1. Right lower quadrant renal transplant without hydronephrosis. 2. Patent transplant vasculature. Electronically Signed   By: Keith Rake M.D.   On: 03/23/2021 21:13    Medications: Scheduled:  sodium chloride   Intravenous Once   acetaminophen  650 mg Oral Once   DULoxetine  30 mg Oral Daily   mometasone-formoterol  2 puff Inhalation BID   mycophenolate  180 mg Oral BID   pantoprazole  40 mg Intravenous Q12H   predniSONE  5 mg Oral Daily   sodium chloride flush  3 mL Intravenous Q12H   tacrolimus  1 mg Oral QHS   tacrolimus  2 mg Oral Daily   Continuous:  sodium chloride 75 mL/hr at 03/23/21 2113    Assessment/Plan: 1) Duodenal ulcer with visible vessel s/p ablation. 2) Diverticula. 3) Anemia.  Her HGB this AM with a recheck is stable.  She had one bout of maroon stool last evening.  She does have a history of diverticular bleeding.  Plan: 1) Continue to follow HGB. 2) Transfuse as necessary. 3) If bleeding continues a relook EGD may be required.  If that examination shows improvement, then it is more likely that she is having a diverticular bleed.  LOS: 6 days   [name] D 03/24/2021, 10:05 AM

## 2021-03-24 NOTE — Plan of Care (Signed)
  Problem: Education: Goal: Knowledge of General Education information will improve Description: Including pain rating scale, medication(s)/side effects and non-pharmacologic comfort measures 03/24/2021 0826 by Katherina Right, RN Outcome: Progressing 03/24/2021 0825 by Katherina Right, RN Outcome: Progressing   Problem: Health Behavior/Discharge Planning: Goal: Ability to manage health-related needs will improve 03/24/2021 0826 by Katherina Right, RN Outcome: Progressing 03/24/2021 0825 by Katherina Right, RN Outcome: Progressing   Problem: Clinical Measurements: Goal: Ability to maintain clinical measurements within normal limits will improve 03/24/2021 0826 by Katherina Right, RN Outcome: Progressing 03/24/2021 0825 by Katherina Right, RN Outcome: Progressing Goal: Will remain free from infection 03/24/2021 0826 by Katherina Right, RN Outcome: Progressing 03/24/2021 0825 by Katherina Right, RN Outcome: Progressing Goal: Diagnostic test results will improve 03/24/2021 0826 by Katherina Right, RN Outcome: Progressing 03/24/2021 0825 by Katherina Right, RN Outcome: Progressing Goal: Respiratory complications will improve 03/24/2021 0826 by Katherina Right, RN Outcome: Progressing 03/24/2021 0825 by Katherina Right, RN Outcome: Progressing Goal: Cardiovascular complication will be avoided 03/24/2021 0826 by Katherina Right, RN Outcome: Progressing 03/24/2021 0825 by Katherina Right, RN Outcome: Progressing   Problem: Activity: Goal: Risk for activity intolerance will decrease 03/24/2021 0826 by Katherina Right, RN Outcome: Progressing 03/24/2021 0825 by Katherina Right, RN Outcome: Progressing   Problem: Nutrition: Goal: Adequate nutrition will be maintained 03/24/2021 0826 by Katherina Right, RN Outcome: Progressing 03/24/2021 0825 by Katherina Right, RN Outcome: Progressing   Problem: Coping: Goal: Level of anxiety will decrease 03/24/2021 0826 by Katherina Right, RN Outcome: Progressing 03/24/2021 0825 by Katherina Right,  RN Outcome: Progressing   Problem: Elimination: Goal: Will not experience complications related to bowel motility 03/24/2021 0826 by Katherina Right, RN Outcome: Progressing 03/24/2021 0825 by Katherina Right, RN Outcome: Progressing Goal: Will not experience complications related to urinary retention 03/24/2021 0826 by Katherina Right, RN Outcome: Progressing 03/24/2021 0825 by Katherina Right, RN Outcome: Progressing   Problem: Pain Managment: Goal: General experience of comfort will improve 03/24/2021 0826 by Katherina Right, RN Outcome: Progressing 03/24/2021 0825 by Katherina Right, RN Outcome: Progressing   Problem: Safety: Goal: Ability to remain free from injury will improve 03/24/2021 0826 by Katherina Right, RN Outcome: Progressing 03/24/2021 0825 by Katherina Right, RN Outcome: Progressing   Problem: Skin Integrity: Goal: Risk for impaired skin integrity will decrease 03/24/2021 0826 by Katherina Right, RN Outcome: Progressing 03/24/2021 0825 by Katherina Right, RN Outcome: Progressing

## 2021-03-25 DIAGNOSIS — K922 Gastrointestinal hemorrhage, unspecified: Secondary | ICD-10-CM

## 2021-03-25 LAB — CBC
HCT: 26 % — ABNORMAL LOW (ref 36.0–46.0)
Hemoglobin: 8.5 g/dL — ABNORMAL LOW (ref 12.0–15.0)
MCH: 29.8 pg (ref 26.0–34.0)
MCHC: 32.7 g/dL (ref 30.0–36.0)
MCV: 91.2 fL (ref 80.0–100.0)
Platelets: 277 10*3/uL (ref 150–400)
RBC: 2.85 MIL/uL — ABNORMAL LOW (ref 3.87–5.11)
RDW: 18.2 % — ABNORMAL HIGH (ref 11.5–15.5)
WBC: 17.9 10*3/uL — ABNORMAL HIGH (ref 4.0–10.5)
nRBC: 0.1 % (ref 0.0–0.2)

## 2021-03-25 LAB — BASIC METABOLIC PANEL
Anion gap: 8 (ref 5–15)
BUN: 42 mg/dL — ABNORMAL HIGH (ref 8–23)
CO2: 18 mmol/L — ABNORMAL LOW (ref 22–32)
Calcium: 8.3 mg/dL — ABNORMAL LOW (ref 8.9–10.3)
Chloride: 115 mmol/L — ABNORMAL HIGH (ref 98–111)
Creatinine, Ser: 3.02 mg/dL — ABNORMAL HIGH (ref 0.44–1.00)
GFR, Estimated: 16 mL/min — ABNORMAL LOW (ref >60)
Glucose, Bld: 97 mg/dL (ref 70–99)
Potassium: 3.1 mmol/L — ABNORMAL LOW (ref 3.5–5.1)
Sodium: 141 mmol/L (ref 135–145)

## 2021-03-25 MED ORDER — POTASSIUM CHLORIDE CRYS ER 20 MEQ PO TBCR
20.0000 meq | EXTENDED_RELEASE_TABLET | Freq: Once | ORAL | Status: AC
  Administered 2021-03-25: 20 meq via ORAL
  Filled 2021-03-25: qty 1

## 2021-03-25 MED ORDER — ONDANSETRON HCL 4 MG PO TABS: 4.0000 mg | ORAL_TABLET | Freq: Three times a day (TID) | ORAL | 0 refills | Status: AC | PRN

## 2021-03-25 MED ORDER — PANTOPRAZOLE SODIUM 40 MG PO TBEC: 40.0000 mg | DELAYED_RELEASE_TABLET | Freq: Two times a day (BID) | ORAL | 0 refills | Status: DC

## 2021-03-25 NOTE — Discharge Summary (Signed)
Physician Discharge Summary  [name] LOV:[id number] DOB: [birth year] DOA: 03/18/2021  PCP: Jane Buffy, NP  Admit date: 03/18/2021 Discharge date: 03/25/2021  Time spent: 40 minutes  Recommendations for Outpatient Follow-up:  Follow outpatient CBC/CMP within 1 week Follow with Dr. Benson [name] or Dr. Collene [name] within 2-3 weeks Follow renal function outpatient  Follow up aspirin with cardiologist outpatient  Follow blood pressure outpatient - holding lasix, resuming coreg/amlodipine at discharge Lasix on hold, follow with PCP/renal outpatient to determine when to resume  Discharge Diagnoses:  Principal Problem:   Acute GI bleeding Active Problems:   Asthmatic bronchitis without complication   Acute kidney injury superimposed on CKD ([place])   Symptomatic anemia   Obstructive sleep apnea   Hyperkalemia   Persistent atrial fibrillation ([place])   Immunosuppression ([place])   Discharge Condition: stable  Diet recommendation: heart healthy  Filed Weights   03/23/21 0454 03/24/21 0448 03/25/21 0500  Weight: 64 kg 65.9 kg 69.4 kg    History of present illness:  75 yo with hx ESRD s/p renal transplant on chronic immunosuppression, atrial fibrillation s/p AV nodal ablation and PPM placemen/watchman procedure, prior PE/DVT/IVC filter c/b intracerebral hemorrhage 2010 off anticoagulation, prior lower GI bleeds with diverticulosis (follows with Dr. Collene [name]), HFpEF, OSA who presented with 1 week of frequent dark re stools, dark black stools on 9/3 and fainted.  She had recurrent syncope in the ED.  She had urgent endoscopy on 9/5 which showed 2 duodenal ulcers which were treated with epinephrine and BiCap cautery.  She's had recurrent bleeding in house, requiring 6 units pRBC at this point.  Suspected source is the duodenal ulcers, but possible that she had lower GI bleeding as well.    She's currently stable for discharge.  Plan for outpatient GI follow up.  See below for additional  details  Hospital Course:  GI Bleed  Duodenal Ulcers  Diverticulosis At this point, duodenal ulcer maybe most likely source, but also possible she's having lower GI bleeding and ulcers were incidental (see EGD report 9/5) S/p treatment of duodenal ulcer with visible vessel by dilute epinephrine injection and bicap cautery Tagged RBC scan negative Last episode of maroon stool 9/8 PM, none since Hb stable, follow outpatient Continue PPI BID (s/p gtt)  Will advance diet as tolerated Needs to avoid NSAIDs in the future.  GI ok with resuming aspirin.  GI c/s, appreciate recommendations - follow up with Dr. Benson [name] in 2-3 weeks.  Avoid NSAIDs.  Consider outpatient HP ab testing.  Needs PPI x8 weeks, then daily.  I disucssed with Dr. Lyndel [name] prior to d/c and he was ok with resuming pts aspirin.     Acute Blood Loss Anemia S/p 6 units pRBC Hb appears stable, follow outpatinet Follow and transfuse for <7 or symptomatic   Leukocytosis Afebrile, stress related? Fluctuating, follow outpatient  She's on chronic steroids, though low dose - 5 mg - doubt this is cause Continue to monitor, w/u additionally as indicated   ESRD s/p Transplant Acute Kidney Injury on CKD IV Baseline creatinine appears to be ~2.3 Presented with creatinine around 4.9 -> peaked to 5.1 Improving at this time - continue to follow outpatient  Follow UA - notable for many bacteria, 21-50 WBC, large LE Follow renal US -> no hydro, patent vasculature  Continue mycophenolate, prograf, prednisone Thought related to ATN due to GI bleed per prior MD discussion with renal Hold lasix at discharge   Pyuria She hasn't c/o clear symptoms  Follow and workup further  if complaints of symptoms   Atrial fibrillation s/p AV nodal ablation  PPM  Watchman No longer on anticoagulation in setting of ICH Aspirin has been held - discussed with Dr. Lyndel [name] prior to discharge, he notes ok to resume today   Hx DVT and PE  S/P IVC filter Not on  anticoagulation due to hx of ICH   Syncope 2/2 ABLA   Hypertension Resume home BP meds (holding lasxi)   Depression Continue cymbalta  Procedures: Impression - There were two ulcers in the second segment of the duodenum. One was cratered, linear, 1cm long and had [name] small visible vessel. The other was round, fairly superfical, without concerning features. I treated the ulcer with the visible vessel by dilute epinephrine injection and then BiCap cautery with 7Fr Gold probe. - The examination was otherwise normal. Recommendation - The ulcer with the visible vessel is probably the source of her recent bleeding but it was not very large and the visible vessel was pretty small and there was no blood in the UGI tract. I think it is therefore still possible that she's been having lower GI bleeding and that these ulcers were incidental. Regardless, I will keep her on clears today, IV PPI drip to continue. Should follow her clinically and transfuse blood products if needed - Drs. [name] and [name] will assume her care in the morning. Consultations: GI  Discharge Exam: Vitals:   03/25/21 0741 03/25/21 0844  BP: (!) 152/72   Pulse: 73   Resp: 20   Temp: 98.8 F (37.1 C)   SpO2: 100% 98%   Eager to discharge home Long discussion re d/c plan with pt and husband  General: No acute distress. Cardiovascular: RRR Lungs: unlabored Abdomen: Soft, nontender, nondistended Neurological: Alert and oriented 3. Moves all extremities 4 . Cranial nerves II through XII grossly intact. Skin: Warm and dry. No rashes or lesions. Extremities: No clubbing or cyanosis. No edema.   Discharge Instructions   Discharge Instructions     Diet - low sodium heart healthy   Complete by: As directed    Discharge instructions   Complete by: As directed    You were seen for [name] GI bleed.  You had an EGD that showed duodenal ulcers which were treated.  We'll continue the protonix (acid blocker) to allow the  ulcer to heal.  You may have also had [name] diverticular bleed.  Return for new, recurrent, or worsening symptoms (bleeding).  AVOID NSAIDs (ibuprofen, aleve, naproxen, etc).  You can continue your aspirin, please discuss this with your cardiologist in follow up.    Continue the protonix as instructed by gastroenterology.  You'll continue twice daily for 2 months, then transition to daily (likely indefinitely).   Please follow up with Baldo Ash this week for repeat labs and an appointment if possible (your blood counts -red and white blood cells - and kidney function and electrolytes need to be rechecked).  You had acute kidney injury related to your bleed.  This is improving.  Hold off on your lasix until you follow with your PCP and/or kidney doctor and they tell you to resume your medicine.  Resume your amlodipine and coreg, but if you have lightheadness or dizziness, stop your blood pressure medicine and call your doctor.   Return for new, recurrent, or worsening symptoms.  Please ask your PCP to request records from this hospitalization so they know what was done and what the next steps will be.   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   Ventolin HFA 108 (90 Base) MCG/ACT  inhaler Generic drug: albuterol Inhale 2 puffs into the lungs 2 (two) times daily as needed.   albuterol (2.5 MG/3ML) 0.083% nebulizer solution Commonly known as: PROVENTIL Take 3 mLs (2.5 mg total) by nebulization every 6 (six) hours as needed for wheezing or shortness of breath. Dx:J45.909   amLODipine 2.5 MG tablet Commonly known as: NORVASC Take 2.5 mg by mouth daily.   aspirin EC 81 MG tablet Take 81 mg by mouth daily. Swallow whole.   budesonide-formoterol 160-4.5 MCG/ACT inhaler Commonly known as: SYMBICORT Inhale 2 puffs into the lungs 2 (two) times daily.   calcitRIOL 0.25 MCG capsule Commonly known as: ROCALTROL Take 0.25 mcg by mouth daily.   carvedilol 12.5 MG tablet Commonly known as: COREG Take 2 tablets (25 mg total) by mouth 2 (two) times daily.   docusate sodium 100 MG capsule Commonly known as: COLACE Take 100 mg by mouth daily.   DULoxetine 30 MG capsule Commonly known as: CYMBALTA TAKE 1 CAPSULE BY MOUTH EVERY DAY What changed:  how much to take how to take this when to take this additional instructions   HYDROcodone bit-homatropine 5-1.5 MG/5ML syrup Commonly known as: HYCODAN Take 5 mLs by mouth every 8 (eight) hours as needed for cough.   IRON PO Take 325 mg by mouth daily.   mycophenolate 180 MG EC tablet Commonly known as: MYFORTIC Take 180 mg by mouth 2 (two) times daily.   ondansetron 4 MG tablet Commonly known as: ZOFRAN Take 1 tablet (4 mg total) by mouth every 8 (eight) hours as needed for up to 9 doses for nausea. What changed: when to take this   pantoprazole 40 MG tablet Commonly known as: Protonix Take 1 tablet (40 mg total) by mouth 2 (two) times daily.   potassium chloride 10 MEQ tablet Commonly known as: KLOR-CON Take 10 mEq by mouth daily.   predniSONE 5 MG tablet Commonly known as: DELTASONE Take 5 mg by mouth daily.   tacrolimus 1 MG capsule Commonly known as: PROGRAF Take 1-2 mg by mouth See admin  instructions. 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    Significant Diagnostic Studies: CT LUMBAR SPINE W CONTRAST  Result Date: 02/28/2021 CLINICAL DATA:  Low back and left lower extremity pain. No previous lumbar surgery. Renal transplant. EXAM: LUMBAR MYELOGRAM CT LUMBAR SPINE WITH INTRATHECAL CONTRAST FLUOROSCOPY TIME:  58 seconds; 70  uGym2 DAP TECHNIQUE: The procedure, risks (including but not limited to bleeding, infection, organ damage ), benefits, and alternatives were explained to the patient. Questions regarding the procedure were encouraged and answered. The patient understands and consents to the procedure. An appropriate entry site was determined under fluoroscopy. Operator donned sterile gloves and mask. Skin site was marked, prepped with Betadine, and draped in usual sterile fashion, and infiltrated locally with 1% lidocaine. Marrissa Dai 22 gauge spinal needle was advanced into the thecal sac at L3-4 from Huldah Marin right parasagittal approach. Clear colorless CSF returned. 17 ml Omnipaque 180 were administered intrathecally for lumbar myelography, followed  by axial CT scanning of the lumbar spine. I personally performed the lumbar puncture and administered the intrathecal contrast. I also personally supervised acquisition of the myelogram images. Coronal and sagittal reconstructions were generated from the axial scan. COMPARISON:  CT 01/12/2019 and 10/04/2009 FINDINGS: 5 non rib-bearing lumbar segments assigned L1-L5. Progressive compression fracture deformity of L1 since 01/12/2019 with greater than 75% loss of height anteriorly, approximately 4 mm retropulsion into the spinal canal. Chronic L2 compression fracture deformity with greater than 50% loss of height anteriorly, minimal retropulsion. Mild lumbar levoscoliosis apex L3. T12-L1: Spinal stenosis secondary to retropulsion from the L1 compression fracture deformity, as well as thickening of the ligamentum flavum. No definite cord distortion or flattening. Foramina patent. L1-2: Moderate left paracentral protrusion with mild flattening of the anterior aspect of the cord. Facet DJD results in subarticular recess encroachment, left worse than right. Mild left foraminal encroachment. Conus terminates behind L2. L2-3: Asymmetric narrowing of the interspace, right worse than left. Broad posterior disc bulge, right worse than left, with narrowing of the right lateral recess, early spinal stenosis, and right foraminal encroachment. L3-4: Circumferential disc bulge. Bilateral facet DJD contributes to bilateral lateral recess encroachment, right worse than left, and mild spinal stenosis. Foramina patent. L4-5: Marked narrowing of the interspace with discogenic sclerosis in the adjacent vertebral bodies and multiple small Schmorl's nodes. Circumferential large disc bulge results in severe bilateral foraminal stenosis. There is mild facet DJD. No spinal stenosis. L5-S1: Narrowing of the interspace with extensive vacuum phenomenon. Disc bulge with large central protrusion/extrusion extending inferiorly behind the S1 segment,  contacting the bilateral S1 nerve roots. Left foraminal stenosis. No spinal stenosis. Scattered aortoiliac atheromatous calcified plaque. Retrievable IVC filter with apex tilted towards medial wall of the IVC, caval wall perforation by multiple posterior struts. Scattered sigmoid diverticula. Cholecystectomy clips. IMPRESSION: 1. Severe L1 compression fracture deformity, with progressive loss of height since 01/12/2019. 2.  Mild multifactorial spinal stenosis T12-L1 without compressive pathology. 3. Left paracentral protrusion L1-2 with anterior cord flattening. 4. Mild multifactorial spinal stenosis L2-3 with right lateral recess and foraminal narrowing 5. Mild multifactorial spinal stenosis L3-4 with lateral recess narrowing, right worse than left. 6. Severe bilateral foraminal stenosis L4-5 secondary to large disc bulge. 7. Disc bulge and large central protrusion L5-S1 with left foraminal stenosis, may affect bilateral S1 nerve roots. Electronically Signed   By: Lucrezia Europe M.D.   On: 02/28/2021 13:04   NM GI Blood Loss  Result Date: 03/21/2021 CLINICAL DATA:  GI bleed EXAM: NUCLEAR MEDICINE GASTROINTESTINAL BLEEDING SCAN TECHNIQUE: Sequential abdominal images were obtained following intravenous administration of Tc-52m labeled red blood cells. RADIOPHARMACEUTICALS:  24.5 mCi Tc-54m pertechnetate in-vitro labeled red cells. COMPARISON:  None. FINDINGS: Imaging was continued for 2 hours. No evidence of radiopharmaceutical extravasation or accumulation to localize GI bleed. IMPRESSION: No visible GI bleed. Electronically Signed   By: Rolm Baptise M.D.   On: 03/21/2021 19:33   US Renal Transplant w/Doppler  Result Date: 03/23/2021 CLINICAL DATA:  Acute kidney injury. EXAM: ULTRASOUND OF RENAL TRANSPLANT WITH RENAL DOPPLER ULTRASOUND TECHNIQUE: Ultrasound examination of the renal transplant was performed with gray-scale, color and duplex doppler evaluation. COMPARISON:  Transplant ultrasound 03/11/2020 FINDINGS:  Transplant kidney location: RLQ Transplant Kidney: Renal measurements: 12.1 x 5.9 x 5.3 cm = volume: 118mL. Normal in size and parenchymal echogenicity. No evidence of mass or hydronephrosis. No peri-transplant fluid collection seen. Color flow in the main renal artery:  Yes Color flow in the main renal vein:  Yes Duplex Doppler Evaluation: Main Renal Artery Velocity: 21.5 cm/sec Main Renal Artery Resistive Index: 0.7 Venous waveform in main renal vein:  Present Intrarenal resistive index in upper pole:  0.6 (normal 0.6-0.8; equivocal 0.8-0.9; abnormal >= 0.9) Intrarenal resistive index in lower pole: 0.65 (normal 0.6-0.8; equivocal 0.8-0.9; abnormal >= 0.9) Bladder: Normal for degree of bladder distention. The ureteral jet is not seen. Other findings:  None. IMPRESSION: 1. Right lower quadrant renal transplant without hydronephrosis. 2. Patent transplant vasculature. Electronically Signed   By: Keith Rake M.D.   On: 03/23/2021 21:13   DG MYELOGRAPHY LUMBAR INJ LUMBOSACRAL  Result Date: 02/28/2021 CLINICAL DATA:  Low back and left lower extremity pain. No previous lumbar surgery. Renal transplant. EXAM: LUMBAR MYELOGRAM CT LUMBAR SPINE WITH INTRATHECAL CONTRAST FLUOROSCOPY TIME:  58 seconds; 66  uGym2 DAP TECHNIQUE: The procedure, risks (including but not limited to bleeding, infection, organ damage ), benefits, and alternatives were explained to the patient. Questions regarding the procedure were encouraged and answered. The patient understands and consents to the procedure. An appropriate entry site was determined under fluoroscopy. Operator donned sterile gloves and mask. Skin site was marked, prepped with Betadine, and draped in usual sterile fashion, and infiltrated locally with 1% lidocaine. Kiaan Overholser 22 gauge spinal needle was advanced into the thecal sac at L3-4 from Amandine Covino right parasagittal approach. Clear colorless CSF returned. 17 ml Omnipaque 180 were administered intrathecally for lumbar myelography,  followed by axial CT scanning of the lumbar spine. I personally performed the lumbar puncture and administered the intrathecal contrast. I also personally supervised acquisition of the myelogram images. Coronal and sagittal reconstructions were generated from the axial scan. COMPARISON:  CT 01/12/2019 and 10/04/2009 FINDINGS: 5 non rib-bearing lumbar segments assigned L1-L5. Progressive compression fracture deformity of L1 since 01/12/2019 with greater than 75% loss of height anteriorly, approximately 4 mm retropulsion into the spinal canal. Chronic L2 compression fracture deformity  with greater than 50% loss of height anteriorly, minimal retropulsion. Mild lumbar levoscoliosis apex L3. T12-L1: Spinal stenosis secondary to retropulsion from the L1 compression fracture deformity, as well as thickening of the ligamentum flavum. No definite cord distortion or flattening. Foramina patent. L1-2: Moderate left paracentral protrusion with mild flattening of the anterior aspect of the cord. Facet DJD results in subarticular recess encroachment, left worse than right. Mild left foraminal encroachment. Conus terminates behind L2. L2-3: Asymmetric narrowing of the interspace, right worse than left. Broad posterior disc bulge, right worse than left, with narrowing of the right lateral recess, early spinal stenosis, and right foraminal encroachment. L3-4: Circumferential disc bulge. Bilateral facet DJD contributes to bilateral lateral recess encroachment, right worse than left, and mild spinal stenosis. Foramina patent. L4-5: Marked narrowing of the interspace with discogenic sclerosis in the adjacent vertebral bodies and multiple small Schmorl's nodes. Circumferential large disc bulge results in severe bilateral foraminal stenosis. There is mild facet DJD. No spinal stenosis. L5-S1: Narrowing of the interspace with extensive vacuum phenomenon. Disc bulge with large central protrusion/extrusion extending inferiorly behind the S1  segment, contacting the bilateral S1 nerve roots. Left foraminal stenosis. No spinal stenosis. Scattered aortoiliac atheromatous calcified plaque. Retrievable IVC filter with apex tilted towards medial wall of the IVC, caval wall perforation by multiple posterior struts. Scattered sigmoid diverticula. Cholecystectomy clips. IMPRESSION: 1. Severe L1 compression fracture deformity, with progressive loss of height since 01/12/2019. 2. Mild multifactorial spinal stenosis T12-L1 without compressive pathology. 3. Left paracentral protrusion L1-2 with anterior cord flattening. 4. Mild multifactorial spinal stenosis L2-3 with right lateral recess and foraminal narrowing 5. Mild multifactorial spinal stenosis L3-4 with lateral recess narrowing, right worse than left. 6. Severe bilateral foraminal stenosis L4-5 secondary to large disc bulge. 7. Disc bulge and large central protrusion L5-S1 with left foraminal stenosis, may affect bilateral S1 nerve roots. Electronically Signed   By: Lucrezia Europe M.D.   On: 02/28/2021 13:04    Microbiology: No results found for this or any previous visit (from the past 240 hour(s)).   Labs: Basic Metabolic Panel: Recent Labs  Lab 03/21/21 0157 03/22/21 0933 03/23/21 0248 03/24/21 0213 03/25/21 0223  NA 137 139 139 139 141  K 4.4 3.3* 3.8 3.4* 3.1*  CL 108 111 113* 114* 115*  CO2 20* 22 21* 16* 18*  GLUCOSE 99 117* 94 93 97  BUN 93* 78* 68* 52* 42*  CREATININE 4.07* 3.82* 3.66* 3.26* 3.02*  CALCIUM 8.1* 8.2* 8.2* 8.1* 8.3*  MG  --   --  1.9 1.7  --   PHOS 5.1*  --  4.6 4.6  --    Liver Function Tests: Recent Labs  Lab 03/18/21 2036 03/20/21 0359 03/21/21 0157 03/23/21 0248 03/24/21 0213  AST 10* 11*  --  12* 10*  ALT 10 9  --  9 9  ALKPHOS 87 49  --  42 49  BILITOT 0.5 0.8  --  0.7 0.6  PROT 5.3* 4.3*  --  4.6* 4.4*  ALBUMIN 2.4* 2.0* 2.2* 2.2* 2.2*   No results for input(s): LIPASE, AMYLASE in the last 168 hours. No results for input(s): AMMONIA in the  last 168 hours. CBC: Recent Labs  Lab 03/19/21 1600 03/20/21 0358 03/21/21 1215 03/21/21 1857 03/23/21 0248 03/23/21 1002 03/23/21 2038 03/24/21 0213 03/24/21 0955 03/24/21 1718 03/25/21 0223  WBC 17.1*   < > 20.8*  --  15.0* 14.7*  --  16.3*  --   --  17.9*  NEUTROABS  16.6*  --  17.6*  --  11.8* 11.9*  --  13.0*  --   --   --   HGB 6.9*   < > 7.8*   < > 7.0* 6.9* 9.2* 8.3* 8.4* 9.0* 8.5*  HCT 21.3*   < > 24.1*   < > 21.7* 21.0* 28.1* 25.9* 25.8* 27.7* 26.0*  MCV 86.2   < > 91.6  --  94.3 93.3  --  92.8  --   --  91.2  PLT 396   < > 285  --  285 274  --  275  --   --  277   < > = values in this interval not displayed.   Cardiac Enzymes: No results for input(s): CKTOTAL, CKMB, CKMBINDEX, TROPONINI in the last 168 hours. BNP: BNP (last 3 results) No results for input(s): BNP in the last 8760 hours.  ProBNP (last 3 results) No results for input(s): PROBNP in the last 8760 hours.  CBG: No results for input(s): GLUCAP in the last 168 hours.     Signed:  Fayrene Helper MD.  [facility] 03/25/2021, 10:38 AM

## 2021-03-25 NOTE — Progress Notes (Addendum)
[facility] Progress Note Covering for Dr. Collene Mares and Dr. Benson Norway  CC:  GI bleed   Subjective:  She is ambulating up in the room without difficulty. No CP, dizziness or SOB. She is tolerating a regular diet. Last BM was Thur evening 9/8, described as a maroon colored stool, no BM since then. She wishes to go home today. Husband at the bedside.   Objective:   EGD 03/20/2021 by Dr. Ardis Hughs: There were two ulcers in the second segment of the duodenum. One was cratered, linear, 1cm long and had a small visible vessel. The other was round, fairly superfical, without concerning features. I treated the ulcer with the visible vessel by dilute epinephrine injection and then BiCap cautery with 7Fr Gold probe. The examination was otherwise normal.   Vital signs in last 24 hours: Temp:  [98 F (36.7 C)-98.8 F (37.1 C)] 98.8 F (37.1 C) (09/10 0741) Pulse Rate:  [69-73] 73 (09/10 0741) Resp:  [18-22] 20 (09/10 0741) BP: (141-154)/(64-80) 152/72 (09/10 0741) SpO2:  [99 %-100 %] 100 % (09/10 0741) Weight:  [69.4 kg] 69.4 kg (09/10 0500) Last BM Date: 03/24/21 General:  Alert in NAD. Heart: RRR, soft systolic murmur.  Pulm: Lungs clear throughout.  Abdomen: Soft, nontender, nondistended. + BS x 4 quads. Extremities:  No edema. Neurologic:  Alert and oriented x4;  Grossly normal neurologically. Psych:  Alert and cooperative. Normal mood and affect.  Intake/Output from previous day: 09/09 0701 - 09/10 0700 In: 840 [P.O.:840] Out: -  Intake/Output this shift: No intake/output data recorded.  Lab Results: Recent Labs    03/23/21 1002 03/23/21 2038 03/24/21 0213 03/24/21 0955 03/24/21 1718 03/25/21 0223  WBC 14.7*  --  16.3*  --   --  17.9*  HGB 6.9*   < > 8.3* 8.4* 9.0* 8.5*  HCT 21.0*   < > 25.9* 25.8* 27.7* 26.0*  PLT 274  --  275  --   --  277   < > = values in this interval not displayed.   BMET Recent Labs    03/23/21 0248 03/24/21 0213 03/25/21 0223  NA  139 139 141  K 3.8 3.4* 3.1*  CL 113* 114* 115*  CO2 21* 16* 18*  GLUCOSE 94 93 97  BUN 68* 52* 42*  CREATININE 3.66* 3.26* 3.02*  CALCIUM 8.2* 8.1* 8.3*   LFT Recent Labs    03/24/21 0213  PROT 4.4*  ALBUMIN 2.2*  AST 10*  ALT 9  ALKPHOS 49  BILITOT 0.6   PT/INR No results for input(s): LABPROT, INR in the last 72 hours. Hepatitis Panel No results for input(s): HEPBSAG, HCVAB, HEPAIGM, HEPBIGM in the last 72 hours.  US Renal Transplant w/Doppler  Result Date: 03/23/2021 CLINICAL DATA:  Acute kidney injury. EXAM: ULTRASOUND OF RENAL TRANSPLANT WITH RENAL DOPPLER ULTRASOUND TECHNIQUE: Ultrasound examination of the renal transplant was performed with gray-scale, color and duplex doppler evaluation. COMPARISON:  Transplant ultrasound 03/11/2020 FINDINGS: Transplant kidney location: RLQ Transplant Kidney: Renal measurements: 12.1 x 5.9 x 5.3 cm = volume: 18mL. Normal in size and parenchymal echogenicity. No evidence of mass or hydronephrosis. No peri-transplant fluid collection seen. Color flow in the main renal artery:  Yes Color flow in the main renal vein:  Yes Duplex Doppler Evaluation: Main Renal Artery Velocity: 21.5 cm/sec Main Renal Artery Resistive Index: 0.7 Venous waveform in main renal vein:  Present Intrarenal resistive index in upper pole:  0.6 (normal 0.6-0.8; equivocal 0.8-0.9; abnormal >= 0.9) Intrarenal resistive  index in lower pole: 0.65 (normal 0.6-0.8; equivocal 0.8-0.9; abnormal >= 0.9) Bladder: Normal for degree of bladder distention. The ureteral jet is not seen. Other findings:  None. IMPRESSION: 1. Right lower quadrant renal transplant without hydronephrosis. 2. Patent transplant vasculature. Electronically Signed   By: Keith Rake M.D.   On: 03/23/2021 21:13    Assessment / Plan: 43) 75 year old female admitted to the hospital 03/18/2021 with dark maroon/burgundy stools/GI bleed and syncope.  Admission hemoglobin 7.1 (baseline Hg 13.1) ->  initially transfused 1  unit of PRBCs.  S/P EGD 9/5 identified 2 ulcers in the second segment of the duodenum, one was cratered, linear, 1 cm long and had a visible vessel which was injected with epi followed by IBI CAP cautery with hemostasis. She required a total of 6 units of PRBCs 9/4 -9/8.  Today hemoglobin level 8.5. -Ok to discharge home from GI standpoint -Pantoprazole 40mg  one po bid -Follow up with Dr. Benson Norway in 2 to 3 weeks, needs CBC next week -Diet as tolerated  2) History of diverticular bleed 11/2019  3) ESRD s/p renal transplant 2017  4) History of ICH while on warfarin 2010, persistent atrial fibrillation s/p AV nodal ablation and PPM, s/p LAA closure with watchman, DVT/PE s/p IVC filter, chronic diastolic CHF (EF 66-44% 0/3474  5) Leukocytosis, likely due to Prednisone        Principal Problem:   Acute GI bleeding Active Problems:   Asthmatic bronchitis without complication   Acute kidney injury superimposed on CKD (HCC)   Symptomatic anemia   Obstructive sleep apnea   Hyperkalemia   Persistent atrial fibrillation ([name])   Immunosuppression ([place])     LOS: 7 days   [name]  03/25/2021, 7:58 AM   Attending physician's note   I have taken an interval history, reviewed the chart and examined the patient. I agree with the Advanced Practitioner's note, impression and recommendations.   Doing well. No further bleeding.  No abdominal pain. Wants to go home.   UGI bleed d/t DU s/p endoscopic Rx 9/5 (req 6U PRBC this adm). H/O diverticular bleed Chronic constipation ESRD s/p renal transplant 2017  PlanL -Continue Protonix 40mg  PO BID x 8 weeks, then QD -Consider outpt HP Ab testing. -Continue Colace 1 tablet p.o. once a day.  Can use MiraLAX on as-needed basis. -Avoid nonsteroidals -FU with Dr. Benson Norway in 2-3 weeks. -D/W pt and pt's husband.  D/W Dr Florene Glen.   Carmell Austria, MD [facility] [phone]

## 2021-03-25 NOTE — Progress Notes (Signed)
PIVs removed. Pt tolerated well. DC instructions given to pt. Pt taken to vehicle via wc.

## 2021-03-25 NOTE — TOC Transition Note (Signed)
Transition of Care [facility]) - CM/SW Discharge Note   Patient Details  Name: JEORGIA HELMING MRN: [id number] Date of Birth: 11-08-1945  Transition of Care [facility]) CM/SW Contact:  Bartholomew Crews, RN Phone Number: [phone] 03/25/2021, 11:13 AM   Clinical Narrative:     Patient to transition home today. HH orders for PT. Spoke with patient and spouse at bedside. Patient active with [facility] for PT. Notified Corene Cornea at [facility] to verify patient active status and advise of transition home. Patient has RW at home.   Final next level of care: [place] Barriers to Discharge: No Barriers Identified   Patient Goals and CMS Choice Patient states their goals for this hospitalization and ongoing recovery are:: return home CMS Medicare.gov Compare Post Acute Care list provided to:: Patient Choice offered to / list presented to : Patient, Spouse  Discharge Placement                       Discharge Plan and Services   Discharge Planning Services: CM Consult Post Acute Care Choice: Home Health, Durable Medical Equipment          DME Arranged: N/A DME Agency: NA       HH Arranged: PT, OT [place] Agency: [facility] ([facility]) Date HH Agency Contacted: 03/25/21 Time [place]: 1108 Representative spoke with at [facility]: [facility] ([place]) Interventions     Readmission Risk Interventions Readmission Risk Prevention Plan 11/18/2019  Transportation Screening Complete  PCP or Specialist Appt within 3-5 Days Complete  HRI or [place] Complete  Social Work Consult for [place] Planning/Counseling Complete  Palliative Care Screening Not Applicable  Medication Review Press photographer) Complete  Some recent data might be hidden

## 2021-03-27 DIAGNOSIS — Z7984 Long term (current) use of oral hypoglycemic drugs: Secondary | ICD-10-CM | POA: Diagnosis not present

## 2021-03-27 DIAGNOSIS — I130 Hypertensive heart and chronic kidney disease with heart failure and stage 1 through stage 4 chronic kidney disease, or unspecified chronic kidney disease: Secondary | ICD-10-CM | POA: Diagnosis not present

## 2021-03-27 DIAGNOSIS — Z8673 Personal history of transient ischemic attack (TIA), and cerebral infarction without residual deficits: Secondary | ICD-10-CM | POA: Diagnosis not present

## 2021-03-27 DIAGNOSIS — K5730 Diverticulosis of large intestine without perforation or abscess without bleeding: Secondary | ICD-10-CM | POA: Diagnosis not present

## 2021-03-27 DIAGNOSIS — F32A Depression, unspecified: Secondary | ICD-10-CM | POA: Diagnosis not present

## 2021-03-27 DIAGNOSIS — D62 Acute posthemorrhagic anemia: Secondary | ICD-10-CM | POA: Diagnosis not present

## 2021-03-27 DIAGNOSIS — G4733 Obstructive sleep apnea (adult) (pediatric): Secondary | ICD-10-CM | POA: Diagnosis not present

## 2021-03-27 DIAGNOSIS — E875 Hyperkalemia: Secondary | ICD-10-CM | POA: Diagnosis not present

## 2021-03-27 DIAGNOSIS — Z79899 Other long term (current) drug therapy: Secondary | ICD-10-CM | POA: Diagnosis not present

## 2021-03-27 DIAGNOSIS — J45909 Unspecified asthma, uncomplicated: Secondary | ICD-10-CM | POA: Diagnosis not present

## 2021-03-27 DIAGNOSIS — Z940 Kidney transplant status: Secondary | ICD-10-CM | POA: Diagnosis not present

## 2021-03-27 DIAGNOSIS — N184 Chronic kidney disease, stage 4 (severe): Secondary | ICD-10-CM | POA: Diagnosis not present

## 2021-03-27 DIAGNOSIS — D849 Immunodeficiency, unspecified: Secondary | ICD-10-CM | POA: Diagnosis not present

## 2021-03-27 DIAGNOSIS — Z86718 Personal history of other venous thrombosis and embolism: Secondary | ICD-10-CM | POA: Diagnosis not present

## 2021-03-27 DIAGNOSIS — Z7982 Long term (current) use of aspirin: Secondary | ICD-10-CM | POA: Diagnosis not present

## 2021-03-27 DIAGNOSIS — I5030 Unspecified diastolic (congestive) heart failure: Secondary | ICD-10-CM | POA: Diagnosis not present

## 2021-03-27 DIAGNOSIS — K5790 Diverticulosis of intestine, part unspecified, without perforation or abscess without bleeding: Secondary | ICD-10-CM | POA: Diagnosis not present

## 2021-03-27 DIAGNOSIS — I4819 Other persistent atrial fibrillation: Secondary | ICD-10-CM | POA: Diagnosis not present

## 2021-03-27 DIAGNOSIS — Z9181 History of falling: Secondary | ICD-10-CM | POA: Diagnosis not present

## 2021-03-27 DIAGNOSIS — Z7951 Long term (current) use of inhaled steroids: Secondary | ICD-10-CM | POA: Diagnosis not present

## 2021-03-27 DIAGNOSIS — K269 Duodenal ulcer, unspecified as acute or chronic, without hemorrhage or perforation: Secondary | ICD-10-CM | POA: Diagnosis not present

## 2021-03-28 ENCOUNTER — Telehealth: Payer: Self-pay | Admitting: Nurse Practitioner

## 2021-03-28 ENCOUNTER — Other Ambulatory Visit: Payer: Self-pay

## 2021-03-28 DIAGNOSIS — I130 Hypertensive heart and chronic kidney disease with heart failure and stage 1 through stage 4 chronic kidney disease, or unspecified chronic kidney disease: Secondary | ICD-10-CM | POA: Diagnosis not present

## 2021-03-28 DIAGNOSIS — D62 Acute posthemorrhagic anemia: Secondary | ICD-10-CM | POA: Diagnosis not present

## 2021-03-28 DIAGNOSIS — K269 Duodenal ulcer, unspecified as acute or chronic, without hemorrhage or perforation: Secondary | ICD-10-CM | POA: Diagnosis not present

## 2021-03-28 DIAGNOSIS — I4819 Other persistent atrial fibrillation: Secondary | ICD-10-CM | POA: Diagnosis not present

## 2021-03-28 DIAGNOSIS — K5790 Diverticulosis of intestine, part unspecified, without perforation or abscess without bleeding: Secondary | ICD-10-CM | POA: Diagnosis not present

## 2021-03-28 DIAGNOSIS — J45909 Unspecified asthma, uncomplicated: Secondary | ICD-10-CM | POA: Diagnosis not present

## 2021-03-28 NOTE — Telephone Encounter (Signed)
[name] with [facility] calling wanting to get verbal orders for patient. OT once a week for 8 weeks [phone]

## 2021-03-29 ENCOUNTER — Ambulatory Visit (INDEPENDENT_AMBULATORY_CARE_PROVIDER_SITE_OTHER): Payer: Medicare Other | Admitting: Nurse Practitioner

## 2021-03-29 ENCOUNTER — Encounter: Payer: Self-pay | Admitting: Nurse Practitioner

## 2021-03-29 VITALS — BP 100/62 | HR 72 | Temp 97.0°F | Wt 141.6 lb

## 2021-03-29 DIAGNOSIS — K269 Duodenal ulcer, unspecified as acute or chronic, without hemorrhage or perforation: Secondary | ICD-10-CM | POA: Diagnosis not present

## 2021-03-29 DIAGNOSIS — K5790 Diverticulosis of intestine, part unspecified, without perforation or abscess without bleeding: Secondary | ICD-10-CM | POA: Diagnosis not present

## 2021-03-29 DIAGNOSIS — D62 Acute posthemorrhagic anemia: Secondary | ICD-10-CM | POA: Diagnosis not present

## 2021-03-29 DIAGNOSIS — I120 Hypertensive chronic kidney disease with stage 5 chronic kidney disease or end stage renal disease: Secondary | ICD-10-CM

## 2021-03-29 DIAGNOSIS — D500 Iron deficiency anemia secondary to blood loss (chronic): Secondary | ICD-10-CM | POA: Diagnosis not present

## 2021-03-29 DIAGNOSIS — I150 Renovascular hypertension: Secondary | ICD-10-CM | POA: Diagnosis not present

## 2021-03-29 DIAGNOSIS — F331 Major depressive disorder, recurrent, moderate: Secondary | ICD-10-CM | POA: Diagnosis not present

## 2021-03-29 DIAGNOSIS — I130 Hypertensive heart and chronic kidney disease with heart failure and stage 1 through stage 4 chronic kidney disease, or unspecified chronic kidney disease: Secondary | ICD-10-CM | POA: Diagnosis not present

## 2021-03-29 DIAGNOSIS — K264 Chronic or unspecified duodenal ulcer with hemorrhage: Secondary | ICD-10-CM

## 2021-03-29 DIAGNOSIS — N185 Chronic kidney disease, stage 5: Secondary | ICD-10-CM | POA: Diagnosis not present

## 2021-03-29 DIAGNOSIS — I4819 Other persistent atrial fibrillation: Secondary | ICD-10-CM | POA: Diagnosis not present

## 2021-03-29 DIAGNOSIS — J45909 Unspecified asthma, uncomplicated: Secondary | ICD-10-CM | POA: Diagnosis not present

## 2021-03-29 NOTE — Assessment & Plan Note (Addendum)
Decline in her mood due to recent hospitalization and continuous poor health. States "I should be taken of. I have been sick for a long time and it is hard" Reports her husband tries to be supportive, but he is aggressive. No SI of hallucination.  Encourage to participate in home OT and PT sessions, perform personal ADLs (like going to the kitchen for food and drinks, eat small frequent meals), and schedule appt with therapist. Continue cymbalta at current dose F/up in 67month

## 2021-03-29 NOTE — Assessment & Plan Note (Signed)
Repeat cbc

## 2021-03-29 NOTE — Assessment & Plan Note (Signed)
BP remains low She reports furosemide was resumed by nephrology yesterday due to LE edema. BP Readings from Last 3 Encounters:  03/29/21 100/62  03/25/21 (!) 152/72  02/28/21 (!) 145/84   Continue hold on coreg and amlodipine She is to monitor BP daily in AM She is to resume amlodipine and call office if BP >130/80 Repeat BMP F/up in 42month

## 2021-03-29 NOTE — Assessment & Plan Note (Signed)
Endoscopy done 03/20/2021: "There were two ulcers in the second segment of the duodenum. One was cratered, linear,1cm long and had a small visible vessel. The other was round, fairly superfical, withoutconcerning features. I treated the ulcer with the visible vessel by dilute epinephrine injection and then BiCap cautery with 7Fr Gold probe. The examination was otherwise normal."  Current use of pantoprazole 40mg  BID and oral iron tab. Reports nausea with ferrous sulfate tab No ABD pain Stool is black, but no constipation or diarrhea. Upcoming appt with Dr. Benson Norway ([place]) on 04/26/2021 Repeat CBC today

## 2021-03-29 NOTE — Patient Instructions (Addendum)
Continue to hold coreg and amlodipine Monitor BP at [facility] every morning. Resume amlodipine if BP >130/80 and call office.  Continue to hold  aspirin  Maintain appt with GI: Dr. Benson Norway [phone]  Go to  lab for blood draw.

## 2021-03-29 NOTE — Progress Notes (Signed)
Subjective:  Patient ID: [name], female    DOB: 1945-12-23  Age: 75 y.o. MRN: [id number]  CC: Hospitalization Follow-up [name] f/u for patient due to GI bleed. /Pt states since the hospital she has not had a good appetite. )  HPI Accompanied by her [name] Admission: 03/18/2021 Discharge: 03/25/2021 Reviewed discharge summary, lab results, radiology report and procedure report. TCM call not completed. Medication list reconciled with patient and [name]  Duodenal ulcer with hemorrhage Endoscopy done 03/20/2021: "There were two ulcers in the second segment of the duodenum. One was cratered, linear,1cm long and had a small visible vessel. The other was round, fairly superfical, withoutconcerning features. I treated the ulcer with the visible vessel by dilute epinephrine injection and then BiCap cautery with 7Fr Gold probe. The examination was otherwise normal."  Current use of pantoprazole 40mg  BID and oral iron tab. Reports nausea with ferrous sulfate tab No ABD pain Stool is black, but no constipation or diarrhea. Upcoming appt with [name] ([place]) on 04/26/2021 Repeat CBC today  Hypertension BP remains low She reports furosemide was resumed by nephrology yesterday due to LE edema. BP Readings from Last 3 Encounters:  03/29/21 100/62  03/25/21 (!) 152/72  02/28/21 (!) 145/84   Continue hold on coreg and amlodipine She is to monitor BP daily in AM She is to resume amlodipine and call office if BP >130/80 Repeat BMP F/up in 62month  Depression Decline in her mood due to recent hospitalization and continuous poor health. States "I should be taken of. I have been sick for a long time and it is hard" Reports her [name] tries to be supportive, but he is aggressive. No SI of hallucination.  Encourage to participate in home OT and PT sessions, perform personal ADLs (like going to the kitchen for food and drinks, eat small frequent meals), and  schedule appt with therapist. Continue cymbalta at current dose F/up in 95month  Iron deficiency anemia Repeat cbc Wt Readings from Last 3 Encounters:  03/29/21 141 lb 9.6 oz (64.2 kg)  03/25/21 153 lb (69.4 kg)  01/26/21 137 lb 6.4 oz (62.3 kg)     Reviewed past Medical, Social and Family history today.  Outpatient Medications Prior to Visit  Medication Sig Dispense Refill   acetaminophen (TYLENOL) 500 MG tablet Take 500 mg by mouth every 6 (six) hours as needed for mild pain or headache.      albuterol (PROVENTIL) (2.5 MG/3ML) 0.083% nebulizer solution Take 3 mLs (2.5 mg total) by nebulization every 6 (six) hours as needed for wheezing or shortness of breath. Dx:J45.909 150 mL 2   amLODipine (NORVASC) 2.5 MG tablet Take 2.5 mg by mouth daily.     aspirin EC 81 MG tablet Take 81 mg by mouth daily. Swallow whole.     budesonide-formoterol (SYMBICORT) 160-4.5 MCG/ACT inhaler Inhale 2 puffs into the lungs 2 (two) times daily.     calcitRIOL (ROCALTROL) 0.25 MCG capsule Take 0.25 mcg by mouth daily.     carvedilol (COREG) 12.5 MG tablet Take 2 tablets (25 mg total) by mouth 2 (two) times daily.     docusate sodium (COLACE) 100 MG capsule Take 100 mg by mouth daily.     DULoxetine (CYMBALTA) 30 MG capsule TAKE 1 CAPSULE BY MOUTH EVERY DAY (Patient taking differently: Take 30 mg by mouth daily.) 90 capsule 3   Ferrous Sulfate (IRON PO) Take 325 mg by mouth daily.      furosemide (LASIX) 80 MG tablet  Take 120 mg by mouth daily.     mycophenolate (MYFORTIC) 180 MG EC tablet Take 180 mg by mouth 2 (two) times daily.     ondansetron (ZOFRAN) 4 MG tablet Take 1 tablet (4 mg total) by mouth every 8 (eight) hours as needed for up to 9 doses for nausea. 9 tablet 0   pantoprazole (PROTONIX) 40 MG tablet Take 1 tablet (40 mg total) by mouth 2 (two) times daily. 120 tablet 0   potassium chloride (KLOR-CON) 10 MEQ tablet Take 10 mEq by mouth daily.     predniSONE (DELTASONE) 5 MG tablet Take 5 mg by  mouth daily.     tacrolimus (PROGRAF) 1 MG capsule Take 1-2 mg by mouth See admin instructions. 2 mg in the morning 1 mg at bedtime     VENTOLIN HFA 108 (90 Base) MCG/ACT inhaler Inhale 2 puffs into the lungs 2 (two) times daily as needed.     HYDROcodone bit-homatropine (HYCODAN) 5-1.5 MG/5ML syrup Take 5 mLs by mouth every 8 (eight) hours as needed for cough. 120 mL 0   No facility-administered medications prior to visit.    ROS See HPI  Objective:  BP 100/62 (BP Location: Left Arm, Patient Position: Sitting, Cuff Size: Normal)   Pulse 72   Temp (!) 97 F (36.1 C) (Temporal)   Wt 141 lb 9.6 oz (64.2 kg)   SpO2 98%   BMI 23.56 kg/m   Physical Exam Constitutional:      General: She is not in acute distress. Cardiovascular:     Rate and Rhythm: Normal rate and regular rhythm.     Pulses: Normal pulses.     Heart sounds: Murmur heard.  Pulmonary:     Effort: Pulmonary effort is normal.     Breath sounds: Normal breath sounds.  Abdominal:     General: Bowel sounds are normal. There is no distension.     Palpations: Abdomen is soft.     Tenderness: There is no abdominal tenderness. There is no guarding.  Neurological:     Mental Status: She is alert.  Psychiatric:        Attention and Perception: Attention normal.        Mood and Affect: Mood is depressed.        Speech: Speech normal.        Behavior: Behavior is cooperative.        Thought Content: Thought content normal.        Cognition and Memory: Cognition and memory normal.   Assessment & Plan:  This visit occurred during the SARS-CoV-2 public health emergency.  Safety protocols were in place, including screening questions prior to the visit, additional usage of staff PPE, and extensive cleaning of exam room while observing appropriate contact time as indicated for disinfecting solutions.   [name] was seen today for hospitalization follow-up.  Diagnoses and all orders for this visit:  Duodenal ulcer with  hemorrhage  Iron deficiency anemia due to chronic blood loss -     CBC with Differential/Platelet  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 (HCC) -     Comprehensive metabolic panel  Renovascular hypertension  Moderate episode of recurrent major depressive disorder ([place])  Problem List Items Addressed This Visit       Cardiovascular and Mediastinum   Hypertension    BP remains low She reports furosemide was resumed by nephrology yesterday due to LE edema. BP Readings from Last 3 Encounters:  03/29/21 100/62  03/25/21 (!) 152/72  02/28/21 (!) 145/84   Continue hold on coreg and amlodipine She is to monitor BP daily in AM She is to resume amlodipine and call office if BP >130/80 Repeat BMP F/up in 44month      Relevant Medications   furosemide (LASIX) 80 MG tablet     Digestive   Duodenal ulcer with hemorrhage - Primary    Endoscopy done 03/20/2021: "There were two ulcers in the second segment of the duodenum. One was cratered, linear,1cm long and had a small visible vessel. The other was round, fairly superfical, withoutconcerning features. I treated the ulcer with the visible vessel by dilute epinephrine injection and then BiCap cautery with 7Fr Gold probe. The examination was otherwise normal."  Current use of pantoprazole 40mg  BID and oral iron tab. Reports nausea with ferrous sulfate tab No ABD pain Stool is black, but no constipation or diarrhea. 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 States "I should be taken of. I have been sick for a long time and it is hard" Reports her [name] tries to be supportive, but he is aggressive. No SI of  hallucination.  Encourage to participate in home OT and PT sessions, perform personal ADLs (like going to the kitchen for food and drinks, eat small frequent meals), and schedule appt with therapist. Continue cymbalta at current dose F/up in 59month      Iron deficiency anemia    Repeat cbc      Relevant Orders   CBC with Differential/Platelet    Follow-up: Return in about 4 weeks (around 04/26/2021) for depression and anxiety.  Wilfred Lacy, NP

## 2021-03-30 ENCOUNTER — Telehealth: Payer: Self-pay

## 2021-03-30 LAB — COMPREHENSIVE METABOLIC PANEL
ALT: 6 U/L (ref 0–35)
AST: 10 U/L (ref 0–37)
Albumin: 2.7 g/dL — ABNORMAL LOW (ref 3.5–5.2)
Alkaline Phosphatase: 62 U/L (ref 39–117)
BUN: 30 mg/dL — ABNORMAL HIGH (ref 6–23)
CO2: 23 mEq/L (ref 19–32)
Calcium: 8.4 mg/dL (ref 8.4–10.5)
Chloride: 110 mEq/L (ref 96–112)
Creatinine, Ser: 3.25 mg/dL — ABNORMAL HIGH (ref 0.40–1.20)
GFR: 13.66 mL/min — CL (ref >60.00)
Glucose, Bld: 101 mg/dL — ABNORMAL HIGH (ref 70–99)
Potassium: 3.8 mEq/L (ref 3.5–5.1)
Sodium: 140 mEq/L (ref 135–145)
Total Bilirubin: 0.7 mg/dL (ref 0.2–1.2)
Total Protein: 5 g/dL — ABNORMAL LOW (ref 6.0–8.3)

## 2021-03-30 LAB — CBC WITH DIFFERENTIAL/PLATELET
Basophils Absolute: 0.2 10*3/uL — ABNORMAL HIGH (ref 0.0–0.1)
Basophils Relative: 1.6 % (ref 0.0–3.0)
Eosinophils Absolute: 0.2 10*3/uL (ref 0.0–0.7)
Eosinophils Relative: 1.6 % (ref 0.0–5.0)
HCT: 25.1 % — ABNORMAL LOW (ref 36.0–46.0)
Hemoglobin: 8 g/dL — CL (ref 12.0–15.0)
Lymphocytes Relative: 4.7 % — ABNORMAL LOW (ref 12.0–46.0)
Lymphs Abs: 0.6 10*3/uL — ABNORMAL LOW (ref 0.7–4.0)
MCHC: 32.1 g/dL (ref 30.0–36.0)
MCV: 90 fl (ref 78.0–100.0)
Monocytes Absolute: 1.4 10*3/uL — ABNORMAL HIGH (ref 0.1–1.0)
Monocytes Relative: 11.1 % (ref 3.0–12.0)
Neutro Abs: 10.2 10*3/uL — ABNORMAL HIGH (ref 1.4–7.7)
Neutrophils Relative %: 81 % — ABNORMAL HIGH (ref 43.0–77.0)
Platelets: 282 10*3/uL (ref 150.0–400.0)
RBC: 2.79 Mil/uL — ABNORMAL LOW (ref 3.87–5.11)
RDW: 17.8 % — ABNORMAL HIGH (ref 11.5–15.5)
WBC: 12.6 10*3/uL — ABNORMAL HIGH (ref 4.0–10.5)

## 2021-03-30 NOTE — Telephone Encounter (Signed)
Critical value:  Pt hgb 0.8 C,   Notified:  [place], [place], NP

## 2021-03-30 NOTE — Addendum Note (Signed)
Addended by: Wilfred Lacy L on: 03/30/2021 03:03 PM   Modules accepted: Orders

## 2021-03-30 NOTE — Telephone Encounter (Signed)
Called and spoke to [place] from [facility]. Verbal orders given per Wilfred Lacy, NP. Sw, cma

## 2021-03-30 NOTE — Progress Notes (Signed)
Chronic Care Management Pharmacy Assistant   Name: [name]  MRN: [id number] DOB: Jun 06, 1946  Reason for Encounter:Hypertension Disease State Call.  Recent office visits:  03/29/2021 Wilfred Lacy NP (PCP) Continue hold on coreg and amlodipine, She is to resume amlodipine and call office if BP >130/80 01/26/2021 Dr Gena Fray MD (PCP Office) No medication changes noted 01/10/2021 Dr Gena Fray MD (PCP Office) start  predniSONE (DELTASONE) 20 MG tablet; Take 1 tablet (20 mg total) by mouth daily with breakfast 01/05/2021 Dr.Rudd MD (PCP office) start HYDROcodone bit-homatropine (HYCODAN) 5-1.5 MG/5ML  PRN  Recent consult visits:  No recent [facility] visits:  Medication Reconciliation was completed by comparing discharge summary, patient's EMR and Pharmacy list, and upon discussion with patient.  Admitted to the hospital on 03/18/2021 due to Acute GI bleeding. Discharge date was 03/25/2021. Discharged from [facility]?Medications Started at [facility] Discharge:?? -started None  Medication Changes at Hospital Discharge: -Changed None  Medications Discontinued at Hospital Discharge: -Stopped None  Medications that remain the same after Hospital Discharge:??  -All other medications will remain the same.    Medications: Outpatient Encounter Medications as of 03/30/2021  Medication Sig   acetaminophen (TYLENOL) 500 MG tablet Take 500 mg by mouth every 6 (six) hours as needed for mild pain or headache.    albuterol (PROVENTIL) (2.5 MG/3ML) 0.083% nebulizer solution Take 3 mLs (2.5 mg total) by nebulization every 6 (six) hours as needed for wheezing or shortness of breath. Dx:J45.909   amLODipine (NORVASC) 2.5 MG tablet Take 2.5 mg by mouth daily.   aspirin EC 81 MG tablet Take 81 mg by mouth daily. Swallow whole.   budesonide-formoterol (SYMBICORT) 160-4.5 MCG/ACT inhaler Inhale 2 puffs into the lungs 2 (two) times daily.   calcitRIOL (ROCALTROL) 0.25 MCG  capsule Take 0.25 mcg by mouth daily.   carvedilol (COREG) 12.5 MG tablet Take 2 tablets (25 mg total) by mouth 2 (two) times daily.   docusate sodium (COLACE) 100 MG capsule Take 100 mg by mouth daily.   DULoxetine (CYMBALTA) 30 MG capsule TAKE 1 CAPSULE BY MOUTH EVERY DAY (Patient taking differently: Take 30 mg by mouth daily.)   Ferrous Sulfate (IRON PO) Take 325 mg by mouth daily.    furosemide (LASIX) 80 MG tablet Take 120 mg by mouth daily.   mycophenolate (MYFORTIC) 180 MG EC tablet Take 180 mg by mouth 2 (two) times daily.   ondansetron (ZOFRAN) 4 MG tablet Take 1 tablet (4 mg total) by mouth every 8 (eight) hours as needed for up to 9 doses for nausea.   pantoprazole (PROTONIX) 40 MG tablet Take 1 tablet (40 mg total) by mouth 2 (two) times daily.   potassium chloride (KLOR-CON) 10 MEQ tablet Take 10 mEq by mouth daily.   predniSONE (DELTASONE) 5 MG tablet Take 5 mg by mouth daily.   tacrolimus (PROGRAF) 1 MG capsule Take 1-2 mg by mouth See admin instructions. 2 mg in the morning 1 mg at bedtime   VENTOLIN HFA 108 (90 Base) MCG/ACT inhaler Inhale 2 puffs into the lungs 2 (two) times daily as needed.   No facility-administered encounter medications on file as of 03/30/2021.    Care Gaps: Shingrix Vaccine COVID-19 Vaccine Influenza Vaccine Star Rating Drugs: None Medication Fill Gaps: None  Reviewed chart prior to disease state call. Spoke with patient regarding BP  Recent Office Vitals: BP Readings from Last 3 Encounters:  03/29/21 100/62  03/25/21 (!) 152/72  02/28/21 Marland Kitchen)  145/84   Pulse Readings from Last 3 Encounters:  03/29/21 72  03/25/21 73  02/28/21 70    Wt Readings from Last 3 Encounters:  03/29/21 141 lb 9.6 oz (64.2 kg)  03/25/21 153 lb (69.4 kg)  01/26/21 137 lb 6.4 oz (62.3 kg)     Kidney Function Lab Results  Component Value Date/Time   CREATININE 3.02 (H) 03/25/2021 02:23 AM   CREATININE 3.26 (H) 03/24/2021 02:13 AM   GFRNONAA 16 (L) 03/25/2021  02:23 AM   GFRAA 24 (L) 03/13/2020 11:18 AM    BMP Latest Ref Rng & Units 03/25/2021 03/24/2021 03/23/2021  Glucose 70 - 99 mg/dL 97 93 94  BUN 8 - 23 mg/dL 42(H) 52(H) 68(H)  Creatinine 0.44 - 1.00 mg/dL 3.02(H) 3.26(H) 3.66(H)  BUN/Creat Ratio 12 - 28 - - -  Sodium 135 - 145 mmol/L 141 139 139  Potassium 3.5 - 5.1 mmol/L 3.1(L) 3.4(L) 3.8  Chloride 98 - 111 mmol/L 115(H) 114(H) 113(H)  CO2 22 - 32 mmol/L 18(L) 16(L) 21(L)  Calcium 8.9 - 10.3 mg/dL 8.3(L) 8.1(L) 8.2(L)    Current antihypertensive regimen:  Amlodipine 2.5 mg (on hold) Carvediolol 12.5 mg (on Hold) What recent interventions/DTPs have been made by any provider to improve Blood Pressure control since last CPP Visit:  03/29/2021 Wilfred Lacy NP (PCP) Continue hold on coreg and amlodipine Any recent hospitalizations or ED visits since last visit with CPP? Yes  I have attempted without success to contact this patient by phone three times to do her hypertension Disease State call. I left a Voice message for patient to return my call.Mailbox Full 09/15,LVM 09/19,09/20   Adherence Review: Is the patient currently on ACE/ARB medication? No Does the patient have >5 day gap between last estimated fill dates? No    Anderson [place] Clinical Production designer, theatre/television/film [phone]

## 2021-03-30 NOTE — Telephone Encounter (Signed)
Correction Report states hgb 8.0 Provider is aware.

## 2021-03-31 ENCOUNTER — Telehealth: Payer: Self-pay | Admitting: Hematology

## 2021-03-31 NOTE — Telephone Encounter (Signed)
Scheduled appt per 9/15 referral. Pt is aware of appt date and time.

## 2021-04-03 DIAGNOSIS — I4819 Other persistent atrial fibrillation: Secondary | ICD-10-CM | POA: Diagnosis not present

## 2021-04-03 DIAGNOSIS — K269 Duodenal ulcer, unspecified as acute or chronic, without hemorrhage or perforation: Secondary | ICD-10-CM | POA: Diagnosis not present

## 2021-04-03 DIAGNOSIS — I130 Hypertensive heart and chronic kidney disease with heart failure and stage 1 through stage 4 chronic kidney disease, or unspecified chronic kidney disease: Secondary | ICD-10-CM | POA: Diagnosis not present

## 2021-04-03 DIAGNOSIS — D62 Acute posthemorrhagic anemia: Secondary | ICD-10-CM | POA: Diagnosis not present

## 2021-04-03 DIAGNOSIS — K5790 Diverticulosis of intestine, part unspecified, without perforation or abscess without bleeding: Secondary | ICD-10-CM | POA: Diagnosis not present

## 2021-04-03 DIAGNOSIS — J45909 Unspecified asthma, uncomplicated: Secondary | ICD-10-CM | POA: Diagnosis not present

## 2021-04-04 ENCOUNTER — Other Ambulatory Visit: Payer: Self-pay

## 2021-04-04 ENCOUNTER — Inpatient Hospital Stay: Payer: Medicare Other

## 2021-04-04 ENCOUNTER — Inpatient Hospital Stay: Payer: Medicare Other | Attending: Hematology | Admitting: Hematology

## 2021-04-04 VITALS — BP 131/72 | HR 70 | Temp 98.7°F | Resp 17 | Ht 65.0 in

## 2021-04-04 DIAGNOSIS — D649 Anemia, unspecified: Secondary | ICD-10-CM

## 2021-04-04 DIAGNOSIS — Z940 Kidney transplant status: Secondary | ICD-10-CM | POA: Diagnosis not present

## 2021-04-04 DIAGNOSIS — D62 Acute posthemorrhagic anemia: Secondary | ICD-10-CM | POA: Diagnosis not present

## 2021-04-04 DIAGNOSIS — K264 Chronic or unspecified duodenal ulcer with hemorrhage: Secondary | ICD-10-CM | POA: Insufficient documentation

## 2021-04-04 DIAGNOSIS — N189 Chronic kidney disease, unspecified: Secondary | ICD-10-CM | POA: Insufficient documentation

## 2021-04-04 DIAGNOSIS — I130 Hypertensive heart and chronic kidney disease with heart failure and stage 1 through stage 4 chronic kidney disease, or unspecified chronic kidney disease: Secondary | ICD-10-CM | POA: Diagnosis not present

## 2021-04-04 DIAGNOSIS — D75839 Thrombocytosis, unspecified: Secondary | ICD-10-CM | POA: Insufficient documentation

## 2021-04-04 DIAGNOSIS — D509 Iron deficiency anemia, unspecified: Secondary | ICD-10-CM | POA: Diagnosis not present

## 2021-04-04 DIAGNOSIS — I509 Heart failure, unspecified: Secondary | ICD-10-CM | POA: Diagnosis not present

## 2021-04-04 DIAGNOSIS — I4819 Other persistent atrial fibrillation: Secondary | ICD-10-CM | POA: Diagnosis not present

## 2021-04-04 DIAGNOSIS — K269 Duodenal ulcer, unspecified as acute or chronic, without hemorrhage or perforation: Secondary | ICD-10-CM | POA: Diagnosis not present

## 2021-04-04 DIAGNOSIS — J45909 Unspecified asthma, uncomplicated: Secondary | ICD-10-CM | POA: Diagnosis not present

## 2021-04-04 DIAGNOSIS — K5790 Diverticulosis of intestine, part unspecified, without perforation or abscess without bleeding: Secondary | ICD-10-CM | POA: Diagnosis not present

## 2021-04-04 LAB — RETICULOCYTES
Immature Retic Fract: 23.3 % — ABNORMAL HIGH (ref 2.3–15.9)
RBC.: 3.07 MIL/uL — ABNORMAL LOW (ref 3.87–5.11)
Retic Count, Absolute: 80.7 10*3/uL (ref 19.0–186.0)
Retic Ct Pct: 2.6 % (ref 0.4–3.1)

## 2021-04-04 LAB — CBC WITH DIFFERENTIAL/PLATELET
Abs Immature Granulocytes: 0.12 10*3/uL — ABNORMAL HIGH (ref 0.00–0.07)
Basophils Absolute: 0.1 10*3/uL (ref 0.0–0.1)
Basophils Relative: 0 %
Eosinophils Absolute: 0.1 10*3/uL (ref 0.0–0.5)
Eosinophils Relative: 1 %
HCT: 26.5 % — ABNORMAL LOW (ref 36.0–46.0)
Hemoglobin: 8.6 g/dL — ABNORMAL LOW (ref 12.0–15.0)
Immature Granulocytes: 1 %
Lymphocytes Relative: 4 %
Lymphs Abs: 0.6 10*3/uL — ABNORMAL LOW (ref 0.7–4.0)
MCH: 28.5 pg (ref 26.0–34.0)
MCHC: 32.5 g/dL (ref 30.0–36.0)
MCV: 87.7 fL (ref 80.0–100.0)
Monocytes Absolute: 1.2 10*3/uL — ABNORMAL HIGH (ref 0.1–1.0)
Monocytes Relative: 8 %
Neutro Abs: 12.5 10*3/uL — ABNORMAL HIGH (ref 1.7–7.7)
Neutrophils Relative %: 86 %
Platelets: 422 10*3/uL — ABNORMAL HIGH (ref 150–400)
RBC: 3.02 MIL/uL — ABNORMAL LOW (ref 3.87–5.11)
RDW: 17.8 % — ABNORMAL HIGH (ref 11.5–15.5)
WBC: 14.6 10*3/uL — ABNORMAL HIGH (ref 4.0–10.5)
nRBC: 0 % (ref 0.0–0.2)

## 2021-04-04 LAB — IRON AND TIBC
Iron: 29 ug/dL — ABNORMAL LOW (ref 41–142)
Saturation Ratios: 17 % — ABNORMAL LOW (ref 21–57)
TIBC: 170 ug/dL — ABNORMAL LOW (ref 236–444)
UIBC: 141 ug/dL (ref 120–384)

## 2021-04-04 LAB — FERRITIN: Ferritin: 321 ng/mL — ABNORMAL HIGH (ref 11–307)

## 2021-04-04 NOTE — Progress Notes (Signed)
Marland Kitchen   HEMATOLOGY/ONCOLOGY CONSULTATION NOTE  Date of Service: 04/04/2021  Patient Care Team: [name], [name], NP as PCP - General (Internal Medicine) Bensimhon, Shaune Pascal, MD as PCP - Advanced Heart Failure (Cardiology) Sueanne Margarita, MD as Consulting Physician (Cardiology) Paralee Cancel, MD as Consulting Physician (Orthopedic Surgery) Fleet Contras, MD as Consulting Physician (Nephrology) Obgyn, Myles Lipps, Glean Salvo, [facility] as Pharmacist (Pharmacist)  CHIEF COMPLAINTS/PURPOSE OF CONSULTATION:  Iron deficiency anemia  HISTORY OF PRESENTING ILLNESS:   [name] is a wonderful 75 y.o. female who has been referred to [name] by Dr .Lorayne Marek, [name], NP for evaluation and management of iron deficiency anemia.  Patient has extensive comorbidities including hypertension, dyslipidemia, CHF, chronic kidney disease status post kidney transplant on immunosuppressive drugs CellCept plus tacrolimus, atrial fibrillation on aspirin.,  Status post AV node ablation and permanent pacemaker placement /Watchman procedure.  Patient was recently in the hospital on 03/18/2021 through 03/25/2021 with acute GI bleeding with urgent EGD on 9/5 showing 2 duodenal ulcers 1 of which had a visible blood vessel.  She received 6 units of PRBCs.  She has had previous lower GI bleeds possibly diverticular.  She was discharged with a plan for outpatient GI follow-up with Dr. Dallas Breeding. [name].  Patient notes that Dr. Harrie Jeans is her nephrologist and is managing her prednisone mycophenolate and Prograf.  Had some bump in her creatinine due to GI bleed and Lasix was held on discharge with a plan to follow-up with nephrology.  Labs done today 04/04/2021 show hemoglobin is up to 8.6 up from 8 on discharge from the hospital.  WBC count is 14.6k with platelets of 422k Ferritin is 321 with an iron saturation of 17%. Myeloma panel shows no M spike Erythropoietin level is to be suppressed at 15.6 RBC folate within  normal limits Recent B12 levels in the hospital were within normal limits at 650 on 03/19/2021.   MEDICAL HISTORY:  Past Medical History:  Diagnosis Date   Acute diastolic (congestive) heart failure (HCC) 08/12/2017   Acute GI bleeding 11/14/2019   Acute kidney injury superimposed on chronic kidney disease ([place]) 11/17/2017   AKI (acute kidney injury) ([place]) 11/04/2017   Anemia    Anxiety    ARF (acute renal failure) ([place]) 06/06/2011   Arthritis    "shoulders; arms; hips" (02/04/2018)   Asthma    Atrial fibrillation (HCC)    Atrial flutter ([place])    s/p aflutter ablation at [facility]   Bacteriuria, asymptomatic 11/14/2020   Benign hypertension with ESRD (end-stage renal disease) ([place])    Blood transfusion    never had a reaction to blood transfusion   Cardiomyopathy Mar 2013   Mild, EF 50-55% by Mar 2013 ECHO, diast dysfxn II   Cardiomyopathy ([place])    CHF (congestive heart failure) ([place]) 2005   CHF (congestive heart failure) ([place])    Childhood asthma    Closed fracture of right distal radius 10/18/2016   CMV (cytomegalovirus infection) ([place]) 10/03/2015   Constipation    takes Miralax daily   Constipation    Depression    takes Zoloft daily   Distal radius fracture, right    DVT of lower extremity, bilateral ([place]) 12/21/11   "they're there now; been there for 2 wks"   Eczema    End stage renal disease ([place]) 11/06/2011   ESRD (end stage renal disease) on dialysis [facility]) 09/2011   07-19-2015 had Kidney transplant at [facility]; "don't get dialysis anymore" (02/04/2018)   Essential hypertension  07/07/2007   Qualifier: Diagnosis of  By: Garen Grams     Fractures, stress    in both feet--6 OR 7 YRS AGO--RESOLVED   Generalized edema [id number]   GI bleed 11/14/2019   Gout    doesn't require meds    HCAP (healthcare-associated pneumonia) 09/10/2011   Hearing difficulty    Hematoma of left lower leg 05/17/2020   High cholesterol    History of CVA (cerebrovascular accident) without residual deficits     History of hip replacement, total, right    History of pneumonia    Hx of Clostridium difficile infection    Hypercalcemia    09/10/11   Hyperparathyroidism ([place]) 04/07/2013   Hypertension    takes Diltiazem daily    Hypomagnesemia 08/02/2015   Hypophosphatemia 11/08/2017   ICB (intracranial bleed) ([place])    Immunosuppression ([place]) 07/25/2015   Memory changes    Morbid obesity ([place])    Nonischemic dilated cardiomyopathy ([place])    Obesity 04/07/2013   Oligouria    Oral mucositis 02/22/2018   OSA on CPAP    PAF (paroxysmal atrial fibrillation) (HCC)     HX OF CEREBRAL BLEED WHILE ON COUMADIN-SO PT NOT ON ANY BLOOD THINNERS NOW   Peripheral vascular disease ([place])    Persistent atrial fibrillation ([place]) 08/12/2017   Overview:  Added automatically from request for surgery [id number] [id number]  Formatting of this note might be different from the original. Added automatically from request for surgery [id number]   Presence of Watchman left atrial appendage closure device 10/04/2017   Proteinuria 09/04/2016   Psoriasis 08/07/2016   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/25/2015   S/P insertion of IVC (inferior vena caval) filter    Sepsis ([place]) 11/08/2017   Stress fracture    bilateral feet   Stroke ([place]) 2009   denies residual on 02/04/2018;  hemorrhagic now off coumadin   Stroke ([place]) 07/17/2007   denies residual, hemorrhagic now off coumadin   Stroke due to intracerebral hemorrhage ([place]) 2009   Suture reaction 09/04/2016   Transfusion history    Use of cane as ambulatory aid     SURGICAL HISTORY: Past Surgical History:  Procedure Laterality Date   AV FISTULA PLACEMENT  09/28/2011   Procedure: ARTERIOVENOUS (AV) FISTULA CREATION;  Surgeon: Rosetta Posner, MD;  Location: [facility] OR;  Service: Vascular;  Laterality: Left;   [place] DEFECT   CHOLECYSTECTOMY     1980's   COLONOSCOPY     COLONOSCOPY WITH PROPOFOL N/A 11/16/2019   Procedure:  COLONOSCOPY WITH PROPOFOL;  Surgeon: Juanita Craver, MD;  Location: [facility] ENDOSCOPY;  Service: Endoscopy;  Laterality: N/A;   CYSTOSCOPY     many yrs ago   ESOPHAGOGASTRODUODENOSCOPY (EGD) WITH PROPOFOL N/A 11/16/2019   Procedure: ESOPHAGOGASTRODUODENOSCOPY (EGD) WITH PROPOFOL;  Surgeon: Juanita Craver, MD;  Location: [facility] ENDOSCOPY;  Service: Endoscopy;  Laterality: N/A;   ESOPHAGOGASTRODUODENOSCOPY (EGD) WITH PROPOFOL N/A 03/20/2021   Procedure: ESOPHAGOGASTRODUODENOSCOPY (EGD) WITH PROPOFOL;  Surgeon: Milus Banister, MD;  Location: [facility] ENDOSCOPY;  Service: Endoscopy;  Laterality: N/A;   FEMUR FRACTURE SURGERY Right 03/2011; 09/03/2011   "had 2, 2 wk apart in 2012; broke it again 08/2011 & had OR"   FRACTURE SURGERY     HEMOSTASIS CONTROL  03/20/2021   Procedure: HEMOSTASIS CONTROL;  Surgeon: Milus Banister, MD;  Location: [facility] ENDOSCOPY;  Service: Endoscopy;;   HOT HEMOSTASIS N/A 03/20/2021   Procedure: HOT HEMOSTASIS (ARGON  PLASMA COAGULATION/BICAP);  Surgeon: Milus Banister, MD;  Location: [facility] ENDOSCOPY;  Service: Endoscopy;  Laterality: N/A;   I & D EXTREMITY  09/15/2011   Procedure: IRRIGATION AND DEBRIDEMENT EXTREMITY w REMOVAL OF HARDWARE;  Surgeon: [name], MD;  Location: [place];  Service: Orthopedics;  Laterality: Right;   INCISION AND DRAINAGE HIP  12/21/2011   Procedure: IRRIGATION AND DEBRIDEMENT HIP;  Surgeon: [name], MD;  Location: [place];  Service: Orthopedics;  Laterality: Right;  I&D RIGHT HIP WITH PLACEMENT ANTIBIOTIC SPACER   INSERTION OF DIALYSIS CATHETER     Procedure: INSERTION OF DIALYSIS CATHETER;  Surgeon: Rosetta Posner, MD;  Location: [place];  Service: Vascular;  Laterality: Right;   IRRIGATION AND DEBRIDEMENT ABSCESS  12/21/11   right hip   JOINT REPLACEMENT     KIDNEY TRANSPLANT  07/19/15   LAPAROSCOPIC GASTRIC BANDING  2008   LEFT ATRIAL APPENDAGE OCCLUSION  10/04/2017   OPEN REDUCTION INTERNAL FIXATION (ORIF) DISTAL RADIAL FRACTURE Right 10/18/2016   Procedure: OPEN  REDUCTION INTERNAL FIXATION (ORIF) RIGHT DISTAL RADIAL FRACTURE;  Surgeon: Marchia Bond, MD;  Location: [place];  Service: Orthopedics;  Laterality: Right;   TOTAL HIP ARTHROPLASTY Right 02/2011   right THA 02/2011, I&D/removal of hardware 09/2011,, repeat I&D Jun 2013, reimplantation R THA 03-26-2012   TOTAL HIP REVISION  03/25/2012   Procedure: TOTAL HIP REVISION;  Surgeon: [name], MD;  Location: [facility] ORS;  Service: Orthopedics;  Laterality: Right;  Right Total Hip Reimplantation   TUBAL LIGATION     VENA CAVA FILTER PLACEMENT  11/2011    SOCIAL HISTORY: Social History   Socioeconomic History   Marital status: Married    Spouse name: Not on file   Number of children: Not on file   Years of education: Not on file   Highest education level: Not on file  Occupational History   Occupation: retired  Tobacco Use   Smoking status: Never   Smokeless tobacco: Never  Vaping Use   Vaping Use: Never used  Substance and Sexual Activity   Alcohol use: Never   Drug use: Never   Sexual activity: Yes    Birth control/protection: Post-menopausal  Other Topics Concern   Not on file  Social History Narrative   Married; retired; lives in [place] Strain: Not on file  Food Insecurity: No Food Insecurity   Worried About Charity fundraiser in the Last Year: Never true   Arboriculturist in the Last Year: Never true  Transportation Needs: Not on file  Physical Activity: Insufficiently Active   Days of Exercise per Week: 2 days   Minutes of Exercise per Session: 60 min  Stress: Stress Concern Present   Feeling of Stress : To some extent  Social Connections: Engineer, building services of Communication with Friends and Family: More than three times a week   Frequency of Social Gatherings with Friends and Family: Once a week   Attends Religious Services: 1 to 4 times per year   Active Member of [employer] or  Organizations: Yes   Attends Archivist Meetings: 1 to 4 times per year   Marital Status: Married  Human resources officer Violence: Not At Risk   Fear of Current or Ex-Partner: No   Emotionally Abused: No   Physically Abused: No   Sexually Abused: No    FAMILY HISTORY: Family History  Problem Relation  Age of Onset   Cancer Father    Diabetes Mother    Hypertension Mother    Arthritis Mother    Hodgkin's lymphoma Other 43       dscd---HODGKINS DISEASE   Hypertension Brother     ALLERGIES:  is allergic to ace inhibitors, amiodarone, amlodipine, warfarin, and warfarin sodium.  MEDICATIONS:  Current Outpatient Medications  Medication Sig Dispense Refill   acetaminophen (TYLENOL) 500 MG tablet Take 500 mg by mouth every 6 (six) hours as needed for mild pain or headache.      albuterol (PROVENTIL) (2.5 MG/3ML) 0.083% nebulizer solution Take 3 mLs (2.5 mg total) by nebulization every 6 (six) hours as needed for wheezing or shortness of breath. Dx:J45.909 150 mL 2   amLODipine (NORVASC) 2.5 MG tablet Take 2.5 mg by mouth daily. (Patient not taking: Reported on 04/04/2021)     aspirin EC 81 MG tablet Take 81 mg by mouth daily. Swallow whole.     budesonide-formoterol (SYMBICORT) 160-4.5 MCG/ACT inhaler Inhale 2 puffs into the lungs 2 (two) times daily.     calcitRIOL (ROCALTROL) 0.25 MCG capsule Take 0.25 mcg by mouth daily.     carvedilol (COREG) 12.5 MG tablet Take 2 tablets (25 mg total) by mouth 2 (two) times daily.     docusate sodium (COLACE) 100 MG capsule Take 100 mg by mouth daily.     DULoxetine (CYMBALTA) 30 MG capsule TAKE 1 CAPSULE BY MOUTH EVERY DAY (Patient taking differently: Take 30 mg by mouth daily.) 90 capsule 3   Ferrous Sulfate (IRON PO) Take 325 mg by mouth daily.      furosemide (LASIX) 80 MG tablet Take 120 mg by mouth daily.     mycophenolate (MYFORTIC) 180 MG EC tablet Take 180 mg by mouth 2 (two) times daily.     ondansetron (ZOFRAN) 4 MG tablet Take 1  tablet (4 mg total) by mouth every 8 (eight) hours as needed for up to 9 doses for nausea. 9 tablet 0   pantoprazole (PROTONIX) 40 MG tablet Take 1 tablet (40 mg total) by mouth 2 (two) times daily. 120 tablet 0   potassium chloride (KLOR-CON) 10 MEQ tablet Take 10 mEq by mouth daily.     predniSONE (DELTASONE) 5 MG tablet Take 5 mg by mouth daily.     tacrolimus (PROGRAF) 1 MG capsule Take 1-2 mg by mouth See admin instructions. 2 mg in the morning 1 mg at bedtime     VENTOLIN HFA 108 (90 Base) MCG/ACT inhaler Inhale 2 puffs into the lungs 2 (two) times daily as needed.     No current facility-administered medications for this visit.    REVIEW OF SYSTEMS:    10 Point review of Systems was done is negative except as noted above.  PHYSICAL EXAMINATION: ECOG PERFORMANCE STATUS: 2 - Symptomatic, <50% confined to bed  . Vitals:   04/04/21 1258  BP: 131/72  Pulse: 70  Resp: 17  Temp: 98.7 F (37.1 C)  SpO2: 100%   Filed Weights   .Body mass index is 23.56 kg/m.  GENERAL:alert, in no acute distress and comfortable SKIN: no acute rashes, no significant lesions EYES: conjunctiva are pink and non-injected, sclera anicteric OROPHARYNX: MMM, no exudates, no oropharyngeal erythema or ulceration NECK: supple, no JVD LYMPH:  no palpable lymphadenopathy in the cervical, axillary or inguinal regions LUNGS: clear to auscultation b/l with normal respiratory effort HEART: regular rate & rhythm ABDOMEN:  normoactive bowel sounds , non tender, not distended. Extremity:  no pedal edema PSYCH: alert & oriented x 3 with fluent speech NEURO: no focal motor/sensory deficits  LABORATORY DATA:  I have reviewed the data as listed  . CBC Latest Ref Rng & Units 04/04/2021 04/04/2021 03/29/2021  WBC 4.0 - 10.5 K/uL 14.6(H) - 12.6(H)  Hemoglobin 12.0 - 15.0 g/dL 8.6(L) - 8.0 Repeated and verified X2.(LL)  Hematocrit 34.0 - 46.6 % 26.5(L) 26.5(L) 25.1(L)  Platelets 150 - 400 K/uL 422(H) - 282.0     . CMP Latest Ref Rng & Units 03/29/2021 03/25/2021 03/24/2021  Glucose 70 - 99 mg/dL 101(H) 97 93  BUN 6 - 23 mg/dL 30(H) 42(H) 52(H)  Creatinine 0.40 - 1.20 mg/dL 3.25(H) 3.02(H) 3.26(H)  Sodium 135 - 145 mEq/L 140 141 139  Potassium 3.5 - 5.1 mEq/L 3.8 3.1(L) 3.4(L)  Chloride 96 - 112 mEq/L 110 115(H) 114(H)  CO2 19 - 32 mEq/L 23 18(L) 16(L)  Calcium 8.4 - 10.5 mg/dL 8.4 8.3(L) 8.1(L)  Total Protein 6.0 - 8.3 g/dL 5.0(L) - 4.4(L)  Total Bilirubin 0.2 - 1.2 mg/dL 0.7 - 0.6  Alkaline Phos 39 - 117 U/L 62 - 49  AST 0 - 37 U/L 10 - 10(L)  ALT 0 - 35 U/L 6 - 9   Component     Latest Ref Rng & Units 04/04/2021  IgG (Immunoglobin G), Serum     586 - 1,602 mg/dL 1,173  IgA     64 - 422 mg/dL 327  IgM (Immunoglobulin M), Srm     26 - 217 mg/dL 41  Total Protein ELP     6.0 - 8.5 g/dL 5.7 (L)  Albumin SerPl Elph-Mcnc     2.9 - 4.4 g/dL 2.6 (L)  Alpha 1     0.0 - 0.4 g/dL 0.4  Alpha2 Glob SerPl Elph-Mcnc     0.4 - 1.0 g/dL 0.7  B-Globulin SerPl Elph-Mcnc     0.7 - 1.3 g/dL 0.9  Gamma Glob SerPl Elph-Mcnc     0.4 - 1.8 g/dL 1.1  M Protein SerPl Elph-Mcnc     Not Observed g/dL Not Observed  Globulin, Total     2.2 - 3.9 g/dL 3.1  Albumin/Glob SerPl     0.7 - 1.7 0.9  IFE 1      Comment  Please Note (HCV):      Comment  Retic Ct Pct     0.4 - 3.1 % 2.6  RBC.     3.87 - 5.11 MIL/uL 3.07 (L)  Retic Count, Absolute     19.0 - 186.0 K/uL 80.7  Immature Retic Fract     2.3 - 15.9 % 23.3 (H)  Iron     41 - 142 ug/dL 29 (L)  TIBC     236 - 444 ug/dL 170 (L)  Saturation Ratios     21 - 57 % 17 (L)  UIBC     120 - 384 ug/dL 141  Folate, Hemolysate     Not Estab. ng/mL 291.0  HCT     34.0 - 46.6 % 26.5 (L)  Folate, RBC     >498 ng/mL 1,098  Erythropoietin     2.6 - 18.5 mIU/mL 15.6  Ferritin     11 - 307 ng/mL 321 (H)    RADIOGRAPHIC STUDIES: I have personally reviewed the radiological images as listed and agreed with the findings in the report. NM GI Blood  Loss  Result Date: 03/21/2021 CLINICAL DATA:  GI bleed EXAM: NUCLEAR MEDICINE GASTROINTESTINAL BLEEDING SCAN  TECHNIQUE: Sequential abdominal images were obtained following intravenous administration of Tc-70mlabeled red blood cells. RADIOPHARMACEUTICALS:  24.5 mCi Tc-929mertechnetate in-vitro labeled red cells. COMPARISON:  None. FINDINGS: Imaging was continued for 2 hours. No evidence of radiopharmaceutical extravasation or accumulation to localize GI bleed. IMPRESSION: No visible GI bleed. Electronically Signed   By: KeRolm Baptise.D.   On: 03/21/2021 19:33   [place] Transplant w/Doppler  Result Date: 03/23/2021 CLINICAL DATA:  Acute kidney injury. EXAM: ULTRASOUND OF RENAL TRANSPLANT WITH RENAL DOPPLER ULTRASOUND TECHNIQUE: Ultrasound examination of the renal transplant was performed with gray-scale, color and duplex doppler evaluation. COMPARISON:  Transplant ultrasound 03/11/2020 FINDINGS: Transplant kidney location: RLQ Transplant Kidney: Renal measurements: 12.1 x 5.9 x 5.3 cm = volume: 19959mNormal in size and parenchymal echogenicity. No evidence of mass or hydronephrosis. No peri-transplant fluid collection seen. Color flow in the main renal artery:  Yes Color flow in the main renal vein:  Yes Duplex Doppler Evaluation: Main Renal Artery Velocity: 21.5 cm/sec Main Renal Artery Resistive Index: 0.7 Venous waveform in main renal vein:  Present Intrarenal resistive index in upper pole:  0.6 (normal 0.6-0.8; equivocal 0.8-0.9; abnormal >= 0.9) Intrarenal resistive index in lower pole: 0.65 (normal 0.6-0.8; equivocal 0.8-0.9; abnormal >= 0.9) Bladder: Normal for degree of bladder distention. The ureteral jet is not seen. Other findings:  None. IMPRESSION: 1. Right lower quadrant renal transplant without hydronephrosis. 2. Patent transplant vasculature. Electronically Signed   By: MelKeith RakeD.   On: 03/23/2021 21:13    ASSESSMENT & PLAN:   [age]ar old female with multiple medical  comorbidities with  1) Acute on chronic anemia Patient likely has chronic anemia related to her chronic kidney disease status post kidney transplant. Her immunosuppressive drugs especially the CellCept but also possibly tacrolimus can add to her anemia. Acute component of anemia is likely due to her GI bleed from her duodenal ulcer for which she was recently admitted on 03/17/2021 and required 6 units of PRBC transfusion.  2) thrombocytosis likely reactive due to patient's acute GI bleeding.  3) chronic leukocytosis likely due to steroid use and recently reactive due to stress from acute GI bleed  PLAN -Patient's hemoglobin today is improved to 8.6 since her recent discharge from the hospital when it was 8. -She notes she is tolerating her oral ferrous sulfate and she could continue to take this to optimize her iron levels. -Patient has follow-up with Dr. HunDallas BreedinganCollene [name] follow-up and manage her duodenal ulcers as well as etiology for recurrent lower GI bleeding. -Continue follow-up with PCP and GI to address and monitor her GI bleeding. -Patient has follow-up with Dr. LorHarrie Jeansr nephrologist to continue to manage her immunosuppressive drugs as well as CellCept dosing in the setting of changing renal function which could add to additional bone marrow suppression affecting her counts. -Patient might likely need EPO injections if she continues to be anemic despite controlling her GI bleed will defer this to nephrology who will need to closely follow her for her post transplant status and to manage her immunosuppressive drugs. -Currently does not need IV iron infusions but will defer further IV iron infusions to nephrology as part of her EPO treatments. -Reasonable to continue taking 1 capsule of B complex p.o. daily to allow for accelerated hematopoiesis. -Patient continues to be on aspirin as per primary care physician will defer this decision to them. -Patient wishes to consolidate care with  her primary care physician kidney doctor and GI.  Follow-up  Labs today Phone visit with Dr. Irene Limbo in 1 week  . Orders Placed This Encounter  Procedures   CBC with Differential/Platelet    Standing Status:   Future    Number of Occurrences:   1    Standing Expiration Date:   04/04/2022   Ferritin    Standing Status:   Future    Number of Occurrences:   1    Standing Expiration Date:   04/04/2022   Iron and TIBC    Standing Status:   Future    Number of Occurrences:   1    Standing Expiration Date:   04/04/2022   Folate RBC    Standing Status:   Future    Number of Occurrences:   1    Standing Expiration Date:   04/04/2022   Erythropoietin    Standing Status:   Future    Number of Occurrences:   1    Standing Expiration Date:   04/04/2022   Multiple Myeloma Panel (SPEP&IFE w/QIG)    Standing Status:   Future    Number of Occurrences:   1    Standing Expiration Date:   04/04/2022   Reticulocytes    Standing Status:   Future    Number of Occurrences:   1    Standing Expiration Date:   04/04/2022    All of the patients questions were answered with apparent satisfaction. The patient knows to call the clinic with any problems, questions or concerns.  I spent 40 minutes counseling the patient face to face. The total time spent in the appointment was 60 minutes and more than 50% was on counseling and direct patient cares.    Sullivan Lone MD [place] AAHIVMS [facility] Hematology/Oncology Physician [facility]  (Office):       [phone] (Work cell):  [phone] (Fax):           [phone]  04/04/2021 1:36 PM

## 2021-04-05 LAB — FOLATE RBC
Folate, Hemolysate: 291 ng/mL
Folate, RBC: 1098 ng/mL (ref >498)
Hematocrit: 26.5 % — ABNORMAL LOW (ref 34.0–46.6)

## 2021-04-05 LAB — ERYTHROPOIETIN: Erythropoietin: 15.6 m[IU]/mL (ref 2.6–18.5)

## 2021-04-06 ENCOUNTER — Inpatient Hospital Stay: Payer: Medicare Other | Admitting: Nurse Practitioner

## 2021-04-07 DIAGNOSIS — I4819 Other persistent atrial fibrillation: Secondary | ICD-10-CM | POA: Diagnosis not present

## 2021-04-07 DIAGNOSIS — J45909 Unspecified asthma, uncomplicated: Secondary | ICD-10-CM | POA: Diagnosis not present

## 2021-04-07 DIAGNOSIS — K5790 Diverticulosis of intestine, part unspecified, without perforation or abscess without bleeding: Secondary | ICD-10-CM | POA: Diagnosis not present

## 2021-04-07 DIAGNOSIS — D62 Acute posthemorrhagic anemia: Secondary | ICD-10-CM | POA: Diagnosis not present

## 2021-04-07 DIAGNOSIS — I130 Hypertensive heart and chronic kidney disease with heart failure and stage 1 through stage 4 chronic kidney disease, or unspecified chronic kidney disease: Secondary | ICD-10-CM | POA: Diagnosis not present

## 2021-04-07 DIAGNOSIS — K269 Duodenal ulcer, unspecified as acute or chronic, without hemorrhage or perforation: Secondary | ICD-10-CM | POA: Diagnosis not present

## 2021-04-07 LAB — MULTIPLE MYELOMA PANEL, SERUM
Albumin SerPl Elph-Mcnc: 2.6 g/dL — ABNORMAL LOW (ref 2.9–4.4)
Albumin/Glob SerPl: 0.9 (ref 0.7–1.7)
Alpha 1: 0.4 g/dL (ref 0.0–0.4)
Alpha2 Glob SerPl Elph-Mcnc: 0.7 g/dL (ref 0.4–1.0)
B-Globulin SerPl Elph-Mcnc: 0.9 g/dL (ref 0.7–1.3)
Gamma Glob SerPl Elph-Mcnc: 1.1 g/dL (ref 0.4–1.8)
Globulin, Total: 3.1 g/dL (ref 2.2–3.9)
IgA: 327 mg/dL (ref 64–422)
IgG (Immunoglobin G), Serum: 1173 mg/dL (ref 586–1602)
IgM (Immunoglobulin M), Srm: 41 mg/dL (ref 26–217)
Total Protein ELP: 5.7 g/dL — ABNORMAL LOW (ref 6.0–8.5)

## 2021-04-10 DIAGNOSIS — K5790 Diverticulosis of intestine, part unspecified, without perforation or abscess without bleeding: Secondary | ICD-10-CM | POA: Diagnosis not present

## 2021-04-10 DIAGNOSIS — I130 Hypertensive heart and chronic kidney disease with heart failure and stage 1 through stage 4 chronic kidney disease, or unspecified chronic kidney disease: Secondary | ICD-10-CM | POA: Diagnosis not present

## 2021-04-10 DIAGNOSIS — K269 Duodenal ulcer, unspecified as acute or chronic, without hemorrhage or perforation: Secondary | ICD-10-CM | POA: Diagnosis not present

## 2021-04-10 DIAGNOSIS — I4819 Other persistent atrial fibrillation: Secondary | ICD-10-CM | POA: Diagnosis not present

## 2021-04-10 DIAGNOSIS — D62 Acute posthemorrhagic anemia: Secondary | ICD-10-CM | POA: Diagnosis not present

## 2021-04-10 DIAGNOSIS — J45909 Unspecified asthma, uncomplicated: Secondary | ICD-10-CM | POA: Diagnosis not present

## 2021-04-11 ENCOUNTER — Inpatient Hospital Stay: Payer: Medicare Other | Admitting: Hematology

## 2021-04-11 DIAGNOSIS — I130 Hypertensive heart and chronic kidney disease with heart failure and stage 1 through stage 4 chronic kidney disease, or unspecified chronic kidney disease: Secondary | ICD-10-CM | POA: Diagnosis not present

## 2021-04-11 DIAGNOSIS — D62 Acute posthemorrhagic anemia: Secondary | ICD-10-CM | POA: Diagnosis not present

## 2021-04-11 DIAGNOSIS — K5790 Diverticulosis of intestine, part unspecified, without perforation or abscess without bleeding: Secondary | ICD-10-CM | POA: Diagnosis not present

## 2021-04-11 DIAGNOSIS — K269 Duodenal ulcer, unspecified as acute or chronic, without hemorrhage or perforation: Secondary | ICD-10-CM | POA: Diagnosis not present

## 2021-04-11 DIAGNOSIS — I4819 Other persistent atrial fibrillation: Secondary | ICD-10-CM | POA: Diagnosis not present

## 2021-04-11 DIAGNOSIS — J45909 Unspecified asthma, uncomplicated: Secondary | ICD-10-CM | POA: Diagnosis not present

## 2021-04-11 NOTE — Progress Notes (Signed)
Pt contacted to let her know she did not need MD phone visit today that lab work looks better and Dr Irene Limbo has sent a note to her other providers. Pt verbalized understanding. Pt to call if she needs anything from this office in the future.

## 2021-04-12 DIAGNOSIS — D62 Acute posthemorrhagic anemia: Secondary | ICD-10-CM | POA: Diagnosis not present

## 2021-04-12 DIAGNOSIS — K5790 Diverticulosis of intestine, part unspecified, without perforation or abscess without bleeding: Secondary | ICD-10-CM | POA: Diagnosis not present

## 2021-04-12 DIAGNOSIS — I4819 Other persistent atrial fibrillation: Secondary | ICD-10-CM | POA: Diagnosis not present

## 2021-04-12 DIAGNOSIS — J45909 Unspecified asthma, uncomplicated: Secondary | ICD-10-CM | POA: Diagnosis not present

## 2021-04-12 DIAGNOSIS — K269 Duodenal ulcer, unspecified as acute or chronic, without hemorrhage or perforation: Secondary | ICD-10-CM | POA: Diagnosis not present

## 2021-04-12 DIAGNOSIS — I130 Hypertensive heart and chronic kidney disease with heart failure and stage 1 through stage 4 chronic kidney disease, or unspecified chronic kidney disease: Secondary | ICD-10-CM | POA: Diagnosis not present

## 2021-04-13 DIAGNOSIS — N179 Acute kidney failure, unspecified: Secondary | ICD-10-CM | POA: Diagnosis not present

## 2021-04-13 DIAGNOSIS — Z940 Kidney transplant status: Secondary | ICD-10-CM | POA: Diagnosis not present

## 2021-04-13 DIAGNOSIS — N2581 Secondary hyperparathyroidism of renal origin: Secondary | ICD-10-CM | POA: Diagnosis not present

## 2021-04-13 DIAGNOSIS — N184 Chronic kidney disease, stage 4 (severe): Secondary | ICD-10-CM | POA: Diagnosis not present

## 2021-04-13 DIAGNOSIS — E876 Hypokalemia: Secondary | ICD-10-CM | POA: Diagnosis not present

## 2021-04-13 DIAGNOSIS — I129 Hypertensive chronic kidney disease with stage 1 through stage 4 chronic kidney disease, or unspecified chronic kidney disease: Secondary | ICD-10-CM | POA: Diagnosis not present

## 2021-04-13 DIAGNOSIS — K922 Gastrointestinal hemorrhage, unspecified: Secondary | ICD-10-CM | POA: Diagnosis not present

## 2021-04-13 DIAGNOSIS — N189 Chronic kidney disease, unspecified: Secondary | ICD-10-CM | POA: Diagnosis not present

## 2021-04-13 DIAGNOSIS — R809 Proteinuria, unspecified: Secondary | ICD-10-CM | POA: Diagnosis not present

## 2021-04-13 DIAGNOSIS — D849 Immunodeficiency, unspecified: Secondary | ICD-10-CM | POA: Diagnosis not present

## 2021-04-13 DIAGNOSIS — D631 Anemia in chronic kidney disease: Secondary | ICD-10-CM | POA: Diagnosis not present

## 2021-04-17 ENCOUNTER — Other Ambulatory Visit (HOSPITAL_COMMUNITY): Payer: Self-pay | Admitting: *Deleted

## 2021-04-18 ENCOUNTER — Encounter (HOSPITAL_COMMUNITY): Payer: Self-pay

## 2021-04-18 ENCOUNTER — Inpatient Hospital Stay (HOSPITAL_COMMUNITY)
Admission: RE | Admit: 2021-04-18 | Discharge: 2021-04-18 | Disposition: A | Discharge status: Home / Self Care | Payer: Medicare Other | Source: Ambulatory Visit | Attending: Nephrology | Admitting: Nephrology

## 2021-04-18 DIAGNOSIS — D509 Iron deficiency anemia, unspecified: Secondary | ICD-10-CM | POA: Diagnosis not present

## 2021-04-18 DIAGNOSIS — K269 Duodenal ulcer, unspecified as acute or chronic, without hemorrhage or perforation: Secondary | ICD-10-CM | POA: Diagnosis not present

## 2021-04-19 DIAGNOSIS — I130 Hypertensive heart and chronic kidney disease with heart failure and stage 1 through stage 4 chronic kidney disease, or unspecified chronic kidney disease: Secondary | ICD-10-CM | POA: Diagnosis not present

## 2021-04-19 DIAGNOSIS — K5790 Diverticulosis of intestine, part unspecified, without perforation or abscess without bleeding: Secondary | ICD-10-CM | POA: Diagnosis not present

## 2021-04-19 DIAGNOSIS — K269 Duodenal ulcer, unspecified as acute or chronic, without hemorrhage or perforation: Secondary | ICD-10-CM | POA: Diagnosis not present

## 2021-04-19 DIAGNOSIS — J45909 Unspecified asthma, uncomplicated: Secondary | ICD-10-CM | POA: Diagnosis not present

## 2021-04-19 DIAGNOSIS — D62 Acute posthemorrhagic anemia: Secondary | ICD-10-CM | POA: Diagnosis not present

## 2021-04-19 DIAGNOSIS — I4819 Other persistent atrial fibrillation: Secondary | ICD-10-CM | POA: Diagnosis not present

## 2021-04-19 NOTE — Telephone Encounter (Signed)
PCP was made aware.

## 2021-04-20 DIAGNOSIS — K922 Gastrointestinal hemorrhage, unspecified: Secondary | ICD-10-CM | POA: Diagnosis not present

## 2021-04-20 DIAGNOSIS — Z940 Kidney transplant status: Secondary | ICD-10-CM | POA: Diagnosis not present

## 2021-04-20 DIAGNOSIS — R809 Proteinuria, unspecified: Secondary | ICD-10-CM | POA: Diagnosis not present

## 2021-04-20 DIAGNOSIS — M5416 Radiculopathy, lumbar region: Secondary | ICD-10-CM | POA: Diagnosis not present

## 2021-04-24 DIAGNOSIS — D62 Acute posthemorrhagic anemia: Secondary | ICD-10-CM | POA: Diagnosis not present

## 2021-04-24 DIAGNOSIS — I4819 Other persistent atrial fibrillation: Secondary | ICD-10-CM | POA: Diagnosis not present

## 2021-04-24 DIAGNOSIS — I130 Hypertensive heart and chronic kidney disease with heart failure and stage 1 through stage 4 chronic kidney disease, or unspecified chronic kidney disease: Secondary | ICD-10-CM | POA: Diagnosis not present

## 2021-04-24 DIAGNOSIS — K5790 Diverticulosis of intestine, part unspecified, without perforation or abscess without bleeding: Secondary | ICD-10-CM | POA: Diagnosis not present

## 2021-04-24 DIAGNOSIS — J45909 Unspecified asthma, uncomplicated: Secondary | ICD-10-CM | POA: Diagnosis not present

## 2021-04-24 DIAGNOSIS — K269 Duodenal ulcer, unspecified as acute or chronic, without hemorrhage or perforation: Secondary | ICD-10-CM | POA: Diagnosis not present

## 2021-04-25 ENCOUNTER — Other Ambulatory Visit (HOSPITAL_COMMUNITY): Payer: Self-pay | Admitting: *Deleted

## 2021-04-26 ENCOUNTER — Other Ambulatory Visit: Payer: Self-pay

## 2021-04-26 ENCOUNTER — Encounter (HOSPITAL_COMMUNITY)
Admission: RE | Admit: 2021-04-26 | Discharge: 2021-04-26 | Disposition: A | Discharge status: Home / Self Care | Payer: Medicare Other | Source: Ambulatory Visit | Attending: Nephrology | Admitting: Nephrology

## 2021-04-26 VITALS — BP 116/63 | HR 71 | Temp 97.8°F | Resp 20 | Wt 133.0 lb

## 2021-04-26 DIAGNOSIS — D849 Immunodeficiency, unspecified: Secondary | ICD-10-CM | POA: Diagnosis not present

## 2021-04-26 DIAGNOSIS — G4733 Obstructive sleep apnea (adult) (pediatric): Secondary | ICD-10-CM | POA: Diagnosis not present

## 2021-04-26 DIAGNOSIS — D631 Anemia in chronic kidney disease: Secondary | ICD-10-CM | POA: Diagnosis not present

## 2021-04-26 DIAGNOSIS — K5790 Diverticulosis of intestine, part unspecified, without perforation or abscess without bleeding: Secondary | ICD-10-CM | POA: Diagnosis not present

## 2021-04-26 DIAGNOSIS — K269 Duodenal ulcer, unspecified as acute or chronic, without hemorrhage or perforation: Secondary | ICD-10-CM | POA: Diagnosis not present

## 2021-04-26 DIAGNOSIS — Z940 Kidney transplant status: Secondary | ICD-10-CM | POA: Diagnosis not present

## 2021-04-26 DIAGNOSIS — I4819 Other persistent atrial fibrillation: Secondary | ICD-10-CM | POA: Diagnosis not present

## 2021-04-26 DIAGNOSIS — E875 Hyperkalemia: Secondary | ICD-10-CM | POA: Diagnosis not present

## 2021-04-26 DIAGNOSIS — D62 Acute posthemorrhagic anemia: Secondary | ICD-10-CM | POA: Diagnosis not present

## 2021-04-26 DIAGNOSIS — I120 Hypertensive chronic kidney disease with stage 5 chronic kidney disease or end stage renal disease: Secondary | ICD-10-CM | POA: Insufficient documentation

## 2021-04-26 DIAGNOSIS — F32A Depression, unspecified: Secondary | ICD-10-CM | POA: Diagnosis not present

## 2021-04-26 DIAGNOSIS — Z8673 Personal history of transient ischemic attack (TIA), and cerebral infarction without residual deficits: Secondary | ICD-10-CM | POA: Diagnosis not present

## 2021-04-26 DIAGNOSIS — Z7951 Long term (current) use of inhaled steroids: Secondary | ICD-10-CM | POA: Diagnosis not present

## 2021-04-26 DIAGNOSIS — I130 Hypertensive heart and chronic kidney disease with heart failure and stage 1 through stage 4 chronic kidney disease, or unspecified chronic kidney disease: Secondary | ICD-10-CM | POA: Diagnosis not present

## 2021-04-26 DIAGNOSIS — K5730 Diverticulosis of large intestine without perforation or abscess without bleeding: Secondary | ICD-10-CM | POA: Diagnosis not present

## 2021-04-26 DIAGNOSIS — Z86718 Personal history of other venous thrombosis and embolism: Secondary | ICD-10-CM | POA: Diagnosis not present

## 2021-04-26 DIAGNOSIS — N185 Chronic kidney disease, stage 5: Secondary | ICD-10-CM | POA: Insufficient documentation

## 2021-04-26 DIAGNOSIS — I5030 Unspecified diastolic (congestive) heart failure: Secondary | ICD-10-CM | POA: Diagnosis not present

## 2021-04-26 DIAGNOSIS — Z9181 History of falling: Secondary | ICD-10-CM | POA: Diagnosis not present

## 2021-04-26 DIAGNOSIS — Z7982 Long term (current) use of aspirin: Secondary | ICD-10-CM | POA: Diagnosis not present

## 2021-04-26 DIAGNOSIS — Z7984 Long term (current) use of oral hypoglycemic drugs: Secondary | ICD-10-CM | POA: Diagnosis not present

## 2021-04-26 DIAGNOSIS — Z79899 Other long term (current) drug therapy: Secondary | ICD-10-CM | POA: Diagnosis not present

## 2021-04-26 DIAGNOSIS — N184 Chronic kidney disease, stage 4 (severe): Secondary | ICD-10-CM | POA: Diagnosis not present

## 2021-04-26 DIAGNOSIS — J45909 Unspecified asthma, uncomplicated: Secondary | ICD-10-CM | POA: Diagnosis not present

## 2021-04-26 LAB — POCT HEMOGLOBIN-HEMACUE: Hemoglobin: 7.7 g/dL — ABNORMAL LOW (ref 12.0–15.0)

## 2021-04-26 LAB — PREPARE RBC (CROSSMATCH)

## 2021-04-26 MED ORDER — EPOETIN ALFA-EPBX 10000 UNIT/ML IJ SOLN
INTRAMUSCULAR | Status: AC
  Administered 2021-04-26: 10000 [IU] via SUBCUTANEOUS
  Filled 2021-04-26: qty 1

## 2021-04-26 MED ORDER — FERUMOXYTOL INJECTION 510 MG/17 ML
510.0000 mg | INTRAVENOUS | Status: DC
  Administered 2021-04-26: 510 mg via INTRAVENOUS
  Filled 2021-04-26: qty 510

## 2021-04-26 MED ORDER — SODIUM CHLORIDE 0.9% IV SOLUTION: Freq: Once | INTRAVENOUS | Status: DC

## 2021-04-26 MED ORDER — EPOETIN ALFA-EPBX 10000 UNIT/ML IJ SOLN: 10000.0000 [IU] | INTRAMUSCULAR | Status: DC

## 2021-04-27 ENCOUNTER — Telehealth: Payer: Self-pay | Admitting: Nurse Practitioner

## 2021-04-27 DIAGNOSIS — I4819 Other persistent atrial fibrillation: Secondary | ICD-10-CM | POA: Diagnosis not present

## 2021-04-27 DIAGNOSIS — K5790 Diverticulosis of intestine, part unspecified, without perforation or abscess without bleeding: Secondary | ICD-10-CM | POA: Diagnosis not present

## 2021-04-27 DIAGNOSIS — D62 Acute posthemorrhagic anemia: Secondary | ICD-10-CM | POA: Diagnosis not present

## 2021-04-27 DIAGNOSIS — K269 Duodenal ulcer, unspecified as acute or chronic, without hemorrhage or perforation: Secondary | ICD-10-CM | POA: Diagnosis not present

## 2021-04-27 DIAGNOSIS — I130 Hypertensive heart and chronic kidney disease with heart failure and stage 1 through stage 4 chronic kidney disease, or unspecified chronic kidney disease: Secondary | ICD-10-CM | POA: Diagnosis not present

## 2021-04-27 DIAGNOSIS — J45909 Unspecified asthma, uncomplicated: Secondary | ICD-10-CM | POA: Diagnosis not present

## 2021-04-27 LAB — BPAM RBC
Blood Product Expiration Date: 202211032359
ISSUE DATE / TIME: 202210120921
Unit Type and Rh: 5100

## 2021-04-27 LAB — TYPE AND SCREEN
ABO/RH(D): O POS
Antibody Screen: NEGATIVE
Unit division: 0

## 2021-04-27 NOTE — Telephone Encounter (Signed)
Claiborne Billings called to request extending the home physical therapy... 2x a week for 4 weeks.  Can leave verbal order on voicemail. Claiborne Billings: [phone]

## 2021-04-28 NOTE — Telephone Encounter (Signed)
Verbal orders given  

## 2021-05-01 DIAGNOSIS — I130 Hypertensive heart and chronic kidney disease with heart failure and stage 1 through stage 4 chronic kidney disease, or unspecified chronic kidney disease: Secondary | ICD-10-CM | POA: Diagnosis not present

## 2021-05-01 DIAGNOSIS — J45909 Unspecified asthma, uncomplicated: Secondary | ICD-10-CM | POA: Diagnosis not present

## 2021-05-01 DIAGNOSIS — K269 Duodenal ulcer, unspecified as acute or chronic, without hemorrhage or perforation: Secondary | ICD-10-CM | POA: Diagnosis not present

## 2021-05-01 DIAGNOSIS — D62 Acute posthemorrhagic anemia: Secondary | ICD-10-CM | POA: Diagnosis not present

## 2021-05-01 DIAGNOSIS — I4819 Other persistent atrial fibrillation: Secondary | ICD-10-CM | POA: Diagnosis not present

## 2021-05-01 DIAGNOSIS — N390 Urinary tract infection, site not specified: Secondary | ICD-10-CM | POA: Diagnosis not present

## 2021-05-01 DIAGNOSIS — N184 Chronic kidney disease, stage 4 (severe): Secondary | ICD-10-CM | POA: Diagnosis not present

## 2021-05-01 DIAGNOSIS — K5790 Diverticulosis of intestine, part unspecified, without perforation or abscess without bleeding: Secondary | ICD-10-CM | POA: Diagnosis not present

## 2021-05-02 ENCOUNTER — Ambulatory Visit: Payer: Medicare Other | Admitting: Nurse Practitioner

## 2021-05-02 ENCOUNTER — Encounter (HOSPITAL_COMMUNITY): Payer: Medicare Other

## 2021-05-02 DIAGNOSIS — I4819 Other persistent atrial fibrillation: Secondary | ICD-10-CM | POA: Diagnosis not present

## 2021-05-02 DIAGNOSIS — K5790 Diverticulosis of intestine, part unspecified, without perforation or abscess without bleeding: Secondary | ICD-10-CM | POA: Diagnosis not present

## 2021-05-02 DIAGNOSIS — D62 Acute posthemorrhagic anemia: Secondary | ICD-10-CM | POA: Diagnosis not present

## 2021-05-02 DIAGNOSIS — K269 Duodenal ulcer, unspecified as acute or chronic, without hemorrhage or perforation: Secondary | ICD-10-CM | POA: Diagnosis not present

## 2021-05-02 DIAGNOSIS — J45909 Unspecified asthma, uncomplicated: Secondary | ICD-10-CM | POA: Diagnosis not present

## 2021-05-02 DIAGNOSIS — I130 Hypertensive heart and chronic kidney disease with heart failure and stage 1 through stage 4 chronic kidney disease, or unspecified chronic kidney disease: Secondary | ICD-10-CM | POA: Diagnosis not present

## 2021-05-03 ENCOUNTER — Other Ambulatory Visit: Payer: Self-pay | Admitting: Nurse Practitioner

## 2021-05-03 DIAGNOSIS — Z1231 Encounter for screening mammogram for malignant neoplasm of breast: Secondary | ICD-10-CM

## 2021-05-03 DIAGNOSIS — Z940 Kidney transplant status: Secondary | ICD-10-CM | POA: Diagnosis not present

## 2021-05-03 DIAGNOSIS — N184 Chronic kidney disease, stage 4 (severe): Secondary | ICD-10-CM | POA: Diagnosis not present

## 2021-05-05 ENCOUNTER — Telehealth: Payer: Self-pay

## 2021-05-05 DIAGNOSIS — K5790 Diverticulosis of intestine, part unspecified, without perforation or abscess without bleeding: Secondary | ICD-10-CM | POA: Diagnosis not present

## 2021-05-05 DIAGNOSIS — I4819 Other persistent atrial fibrillation: Secondary | ICD-10-CM | POA: Diagnosis not present

## 2021-05-05 DIAGNOSIS — J45909 Unspecified asthma, uncomplicated: Secondary | ICD-10-CM | POA: Diagnosis not present

## 2021-05-05 DIAGNOSIS — I130 Hypertensive heart and chronic kidney disease with heart failure and stage 1 through stage 4 chronic kidney disease, or unspecified chronic kidney disease: Secondary | ICD-10-CM | POA: Diagnosis not present

## 2021-05-05 DIAGNOSIS — K269 Duodenal ulcer, unspecified as acute or chronic, without hemorrhage or perforation: Secondary | ICD-10-CM | POA: Diagnosis not present

## 2021-05-05 DIAGNOSIS — D62 Acute posthemorrhagic anemia: Secondary | ICD-10-CM | POA: Diagnosis not present

## 2021-05-05 NOTE — Progress Notes (Signed)
Chronic Care Management Pharmacy Assistant   Name: [name]  MRN: [id number] DOB: June 18, 1946  Reason for Encounter:Hypertension Disease State Call.   Recent office visits:  No recent Office Visit  Recent consult visits:  04/04/2021 Dr.Kale MD (Oncology) No Medication Changes noted  Hospital visits:  None in previous 6 months  Medications: Outpatient Encounter Medications as of 05/05/2021  Medication Sig   acetaminophen (TYLENOL) 500 MG tablet Take 500 mg by mouth every 6 (six) hours as needed for mild pain or headache.    albuterol (PROVENTIL) (2.5 MG/3ML) 0.083% nebulizer solution Take 3 mLs (2.5 mg total) by nebulization every 6 (six) hours as needed for wheezing or shortness of breath. Dx:J45.909   amLODipine (NORVASC) 2.5 MG tablet Take 2.5 mg by mouth daily. (Patient not taking: Reported on 04/04/2021)   aspirin EC 81 MG tablet Take 81 mg by mouth daily. Swallow whole.   budesonide-formoterol (SYMBICORT) 160-4.5 MCG/ACT inhaler Inhale 2 puffs into the lungs 2 (two) times daily.   calcitRIOL (ROCALTROL) 0.25 MCG capsule Take 0.25 mcg by mouth daily.   carvedilol (COREG) 12.5 MG tablet Take 2 tablets (25 mg total) by mouth 2 (two) times daily.   docusate sodium (COLACE) 100 MG capsule Take 100 mg by mouth daily.   DULoxetine (CYMBALTA) 30 MG capsule TAKE 1 CAPSULE BY MOUTH EVERY DAY (Patient taking differently: Take 30 mg by mouth daily.)   Ferrous Sulfate (IRON PO) Take 325 mg by mouth daily.    furosemide (LASIX) 80 MG tablet Take 120 mg by mouth daily.   mycophenolate (MYFORTIC) 180 MG EC tablet Take 180 mg by mouth 2 (two) times daily.   ondansetron (ZOFRAN) 4 MG tablet Take 1 tablet (4 mg total) by mouth every 8 (eight) hours as needed for up to 9 doses for nausea.   pantoprazole (PROTONIX) 40 MG tablet Take 1 tablet (40 mg total) by mouth 2 (two) times daily.   potassium chloride (KLOR-CON) 10 MEQ tablet Take 10 mEq by mouth daily.   predniSONE (DELTASONE) 5 MG  tablet Take 5 mg by mouth daily.   tacrolimus (PROGRAF) 1 MG capsule Take 1-2 mg by mouth See admin instructions. 2 mg in the morning 1 mg at bedtime   VENTOLIN HFA 108 (90 Base) MCG/ACT inhaler Inhale 2 puffs into the lungs 2 (two) times daily as needed.   No facility-administered encounter medications on file as of 05/05/2021.    Care Gaps: Shingrix Vaccine COVID-19 Vaccine (4- Booster) Influenza Vaccine Star Rating Drugs: None Medication Fill Gaps: None  Reviewed chart prior to disease state call. Spoke with patient regarding BP  Recent Office Vitals: BP Readings from Last 3 Encounters:  04/26/21 116/63  04/04/21 131/72  03/29/21 100/62   Pulse Readings from Last 3 Encounters:  04/26/21 71  04/04/21 70  03/29/21 72    Wt Readings from Last 3 Encounters:  04/26/21 133 lb (60.3 kg)  03/29/21 141 lb 9.6 oz (64.2 kg)  03/25/21 153 lb (69.4 kg)     Kidney Function Lab Results  Component Value Date/Time   CREATININE 3.25 (H) 03/29/2021 02:52 PM   CREATININE 3.02 (H) 03/25/2021 02:23 AM   GFR 13.66 (LL) 03/29/2021 02:52 PM   GFRNONAA 16 (L) 03/25/2021 02:23 AM   GFRAA 24 (L) 03/13/2020 11:18 AM    BMP Latest Ref Rng & Units 03/29/2021 03/25/2021 03/24/2021  Glucose 70 - 99 mg/dL 101(H) 97 93  BUN 6 - 23 mg/dL 30(H) 42(H) 52(H)  Creatinine 0.40 -  1.20 mg/dL 3.25(H) 3.02(H) 3.26(H)  BUN/Creat Ratio 12 - 28 - - -  Sodium 135 - 145 mEq/L 140 141 139  Potassium 3.5 - 5.1 mEq/L 3.8 3.1(L) 3.4(L)  Chloride 96 - 112 mEq/L 110 115(H) 114(H)  CO2 19 - 32 mEq/L 23 18(L) 16(L)  Calcium 8.4 - 10.5 mg/dL 8.4 8.3(L) 8.1(L)    Current antihypertensive regimen:  Amlodipine 2.5 mg  Carvediolol 12.5 mg  What recent interventions/DTPs have been made by any provider to improve Blood Pressure control since last CPP Visit: none Any recent hospitalizations or ED visits since last visit with CPP? No  I have attempted without success to contact this patient by phone three times to do  her hypertension Disease State call. I left a Voice message for patient to return my call.LVM 10/21,10/24,10/25  Adherence Review: Is the patient currently on ACE/ARB medication? No Does the patient have >5 day gap between last estimated fill dates? No  Anderson Malta Clinical Production designer, theatre/television/film [phone]

## 2021-05-08 DIAGNOSIS — R809 Proteinuria, unspecified: Secondary | ICD-10-CM | POA: Diagnosis not present

## 2021-05-08 DIAGNOSIS — D849 Immunodeficiency, unspecified: Secondary | ICD-10-CM | POA: Diagnosis not present

## 2021-05-08 DIAGNOSIS — K922 Gastrointestinal hemorrhage, unspecified: Secondary | ICD-10-CM | POA: Diagnosis not present

## 2021-05-08 DIAGNOSIS — N390 Urinary tract infection, site not specified: Secondary | ICD-10-CM | POA: Diagnosis not present

## 2021-05-08 DIAGNOSIS — E876 Hypokalemia: Secondary | ICD-10-CM | POA: Diagnosis not present

## 2021-05-08 DIAGNOSIS — N2581 Secondary hyperparathyroidism of renal origin: Secondary | ICD-10-CM | POA: Diagnosis not present

## 2021-05-08 DIAGNOSIS — N184 Chronic kidney disease, stage 4 (severe): Secondary | ICD-10-CM | POA: Diagnosis not present

## 2021-05-08 DIAGNOSIS — Z940 Kidney transplant status: Secondary | ICD-10-CM | POA: Diagnosis not present

## 2021-05-08 DIAGNOSIS — I129 Hypertensive chronic kidney disease with stage 1 through stage 4 chronic kidney disease, or unspecified chronic kidney disease: Secondary | ICD-10-CM | POA: Diagnosis not present

## 2021-05-08 DIAGNOSIS — N179 Acute kidney failure, unspecified: Secondary | ICD-10-CM | POA: Diagnosis not present

## 2021-05-08 DIAGNOSIS — D631 Anemia in chronic kidney disease: Secondary | ICD-10-CM | POA: Diagnosis not present

## 2021-05-09 ENCOUNTER — Ambulatory Visit: Payer: Medicare Other

## 2021-05-09 DIAGNOSIS — I130 Hypertensive heart and chronic kidney disease with heart failure and stage 1 through stage 4 chronic kidney disease, or unspecified chronic kidney disease: Secondary | ICD-10-CM | POA: Diagnosis not present

## 2021-05-09 DIAGNOSIS — D62 Acute posthemorrhagic anemia: Secondary | ICD-10-CM | POA: Diagnosis not present

## 2021-05-09 DIAGNOSIS — I4819 Other persistent atrial fibrillation: Secondary | ICD-10-CM | POA: Diagnosis not present

## 2021-05-09 DIAGNOSIS — K5790 Diverticulosis of intestine, part unspecified, without perforation or abscess without bleeding: Secondary | ICD-10-CM | POA: Diagnosis not present

## 2021-05-09 DIAGNOSIS — K269 Duodenal ulcer, unspecified as acute or chronic, without hemorrhage or perforation: Secondary | ICD-10-CM | POA: Diagnosis not present

## 2021-05-09 DIAGNOSIS — J45909 Unspecified asthma, uncomplicated: Secondary | ICD-10-CM | POA: Diagnosis not present

## 2021-05-10 ENCOUNTER — Encounter (HOSPITAL_COMMUNITY)
Admission: RE | Admit: 2021-05-10 | Discharge: 2021-05-10 | Disposition: A | Discharge status: Home / Self Care | Payer: Medicare Other | Source: Ambulatory Visit | Attending: Nephrology | Admitting: Nephrology

## 2021-05-10 ENCOUNTER — Other Ambulatory Visit: Payer: Self-pay

## 2021-05-10 VITALS — BP 134/72 | HR 70 | Temp 97.0°F | Resp 20

## 2021-05-10 DIAGNOSIS — K269 Duodenal ulcer, unspecified as acute or chronic, without hemorrhage or perforation: Secondary | ICD-10-CM | POA: Diagnosis not present

## 2021-05-10 DIAGNOSIS — D631 Anemia in chronic kidney disease: Secondary | ICD-10-CM | POA: Diagnosis not present

## 2021-05-10 DIAGNOSIS — I4819 Other persistent atrial fibrillation: Secondary | ICD-10-CM | POA: Diagnosis not present

## 2021-05-10 DIAGNOSIS — I120 Hypertensive chronic kidney disease with stage 5 chronic kidney disease or end stage renal disease: Secondary | ICD-10-CM

## 2021-05-10 DIAGNOSIS — J45909 Unspecified asthma, uncomplicated: Secondary | ICD-10-CM | POA: Diagnosis not present

## 2021-05-10 DIAGNOSIS — D62 Acute posthemorrhagic anemia: Secondary | ICD-10-CM | POA: Diagnosis not present

## 2021-05-10 DIAGNOSIS — I130 Hypertensive heart and chronic kidney disease with heart failure and stage 1 through stage 4 chronic kidney disease, or unspecified chronic kidney disease: Secondary | ICD-10-CM | POA: Diagnosis not present

## 2021-05-10 DIAGNOSIS — K5790 Diverticulosis of intestine, part unspecified, without perforation or abscess without bleeding: Secondary | ICD-10-CM | POA: Diagnosis not present

## 2021-05-10 DIAGNOSIS — N185 Chronic kidney disease, stage 5: Secondary | ICD-10-CM | POA: Diagnosis not present

## 2021-05-10 LAB — POCT HEMOGLOBIN-HEMACUE: Hemoglobin: 10 g/dL — ABNORMAL LOW (ref 12.0–15.0)

## 2021-05-10 MED ORDER — EPOETIN ALFA-EPBX 10000 UNIT/ML IJ SOLN
INTRAMUSCULAR | Status: AC
  Administered 2021-05-10: 10000 [IU] via SUBCUTANEOUS
  Filled 2021-05-10: qty 1

## 2021-05-10 MED ORDER — FERUMOXYTOL INJECTION 510 MG/17 ML
510.0000 mg | INTRAVENOUS | Status: AC
  Administered 2021-05-10: 510 mg via INTRAVENOUS
  Filled 2021-05-10: qty 17

## 2021-05-10 MED ORDER — EPOETIN ALFA-EPBX 10000 UNIT/ML IJ SOLN: 10000.0000 [IU] | INTRAMUSCULAR | Status: DC

## 2021-05-12 ENCOUNTER — Telehealth: Payer: Self-pay

## 2021-05-12 NOTE — Telephone Encounter (Signed)
Patient husband called and states he feels like his wife needs to be seen by a psychologist very soon. He also states he feels she needs more therapy because he feels like she does not have the strength she should after doing therapy. He states the left foot is starting to lean to the right and he does not know what is going on there but it is concerning to him. Pt advised to schedule an appointment to address his concerns. Appointment scheduled and provider made aware.

## 2021-05-15 DIAGNOSIS — K5790 Diverticulosis of intestine, part unspecified, without perforation or abscess without bleeding: Secondary | ICD-10-CM | POA: Diagnosis not present

## 2021-05-15 DIAGNOSIS — I130 Hypertensive heart and chronic kidney disease with heart failure and stage 1 through stage 4 chronic kidney disease, or unspecified chronic kidney disease: Secondary | ICD-10-CM | POA: Diagnosis not present

## 2021-05-15 DIAGNOSIS — J45909 Unspecified asthma, uncomplicated: Secondary | ICD-10-CM | POA: Diagnosis not present

## 2021-05-15 DIAGNOSIS — I4819 Other persistent atrial fibrillation: Secondary | ICD-10-CM | POA: Diagnosis not present

## 2021-05-15 DIAGNOSIS — D62 Acute posthemorrhagic anemia: Secondary | ICD-10-CM | POA: Diagnosis not present

## 2021-05-15 DIAGNOSIS — K269 Duodenal ulcer, unspecified as acute or chronic, without hemorrhage or perforation: Secondary | ICD-10-CM | POA: Diagnosis not present

## 2021-05-16 DIAGNOSIS — K5790 Diverticulosis of intestine, part unspecified, without perforation or abscess without bleeding: Secondary | ICD-10-CM | POA: Diagnosis not present

## 2021-05-16 DIAGNOSIS — D62 Acute posthemorrhagic anemia: Secondary | ICD-10-CM | POA: Diagnosis not present

## 2021-05-16 DIAGNOSIS — J45909 Unspecified asthma, uncomplicated: Secondary | ICD-10-CM | POA: Diagnosis not present

## 2021-05-16 DIAGNOSIS — K269 Duodenal ulcer, unspecified as acute or chronic, without hemorrhage or perforation: Secondary | ICD-10-CM | POA: Diagnosis not present

## 2021-05-16 DIAGNOSIS — I130 Hypertensive heart and chronic kidney disease with heart failure and stage 1 through stage 4 chronic kidney disease, or unspecified chronic kidney disease: Secondary | ICD-10-CM | POA: Diagnosis not present

## 2021-05-16 DIAGNOSIS — I4819 Other persistent atrial fibrillation: Secondary | ICD-10-CM | POA: Diagnosis not present

## 2021-05-17 DIAGNOSIS — I130 Hypertensive heart and chronic kidney disease with heart failure and stage 1 through stage 4 chronic kidney disease, or unspecified chronic kidney disease: Secondary | ICD-10-CM | POA: Diagnosis not present

## 2021-05-17 DIAGNOSIS — J45909 Unspecified asthma, uncomplicated: Secondary | ICD-10-CM | POA: Diagnosis not present

## 2021-05-17 DIAGNOSIS — K5790 Diverticulosis of intestine, part unspecified, without perforation or abscess without bleeding: Secondary | ICD-10-CM | POA: Diagnosis not present

## 2021-05-17 DIAGNOSIS — D62 Acute posthemorrhagic anemia: Secondary | ICD-10-CM | POA: Diagnosis not present

## 2021-05-17 DIAGNOSIS — I4819 Other persistent atrial fibrillation: Secondary | ICD-10-CM | POA: Diagnosis not present

## 2021-05-17 DIAGNOSIS — K269 Duodenal ulcer, unspecified as acute or chronic, without hemorrhage or perforation: Secondary | ICD-10-CM | POA: Diagnosis not present

## 2021-05-18 NOTE — Telephone Encounter (Signed)
Will address at [place] on 05/19/21

## 2021-05-19 ENCOUNTER — Encounter: Payer: Self-pay | Admitting: Nurse Practitioner

## 2021-05-19 ENCOUNTER — Other Ambulatory Visit: Payer: Self-pay

## 2021-05-19 ENCOUNTER — Ambulatory Visit (INDEPENDENT_AMBULATORY_CARE_PROVIDER_SITE_OTHER): Payer: Medicare Other | Admitting: Nurse Practitioner

## 2021-05-19 VITALS — BP 114/64 | HR 74 | Temp 96.7°F | Ht 65.5 in | Wt 128.4 lb

## 2021-05-19 DIAGNOSIS — F32A Depression, unspecified: Secondary | ICD-10-CM

## 2021-05-19 NOTE — Progress Notes (Signed)
Subjective:  Patient ID: [name], female    DOB: 11-11-1945  Age: 75 y.o. MRN: [id number]  CC: Acute Visit (Pt husband has some concerns regarding pt mood and would like to discuss)  HPI Accompanied by husband and daughter. Family is concerned about Lack of activity and poor nutrition has led to progressive generalized weakness. Despite encouragement from them, [name] does not want to increase daily activity per husband and daughter. They report recent fall in her bathroom. She was assisted by her son to get off the floor. She admits she did not use walker which led to fall. She declines any physical injury. Family wants to inquire about use of home health nurse aide and/companion to assist with ADLs and supervision. [name] admits to persistent depression. She has not been able to schedule appt with previous therapist. Current use of cyambalta daily. Previous use of zoloft with no improvement.  Reviewed past Medical, Social and Family history today.  Outpatient Medications Prior to Visit  Medication Sig Dispense Refill   acetaminophen (TYLENOL) 500 MG tablet Take 500 mg by mouth every 6 (six) hours as needed for mild pain or headache.      albuterol (PROVENTIL) (2.5 MG/3ML) 0.083% nebulizer solution Take 3 mLs (2.5 mg total) by nebulization every 6 (six) hours as needed for wheezing or shortness of breath. Dx:J45.909 150 mL 2   aspirin EC 81 MG tablet Take 81 mg by mouth daily. Swallow whole.     budesonide-formoterol (SYMBICORT) 160-4.5 MCG/ACT inhaler Inhale 2 puffs into the lungs 2 (two) times daily.     calcitRIOL (ROCALTROL) 0.25 MCG capsule Take 0.25 mcg by mouth daily.     carvedilol (COREG) 12.5 MG tablet Take 2 tablets (25 mg total) by mouth 2 (two) times daily.     docusate sodium (COLACE) 100 MG capsule Take 100 mg by mouth daily.     DULoxetine (CYMBALTA) 30 MG capsule TAKE 1 CAPSULE BY MOUTH EVERY DAY (Patient taking differently: Take 30 mg by mouth daily.) 90  capsule 3   Ferrous Sulfate (IRON PO) Take 325 mg by mouth daily.      furosemide (LASIX) 80 MG tablet Take 120 mg by mouth daily.     mycophenolate (MYFORTIC) 180 MG EC tablet Take 180 mg by mouth 2 (two) times daily.     ondansetron (ZOFRAN) 4 MG tablet Take 1 tablet (4 mg total) by mouth every 8 (eight) hours as needed for up to 9 doses for nausea. 9 tablet 0   pantoprazole (PROTONIX) 40 MG tablet Take 1 tablet (40 mg total) by mouth 2 (two) times daily. 120 tablet 0   predniSONE (DELTASONE) 5 MG tablet Take 5 mg by mouth daily.     tacrolimus (PROGRAF) 1 MG capsule Take 1-2 mg by mouth See admin instructions. 2 mg in the morning 1 mg at bedtime     VENTOLIN HFA 108 (90 Base) MCG/ACT inhaler Inhale 2 puffs into the lungs 2 (two) times daily as needed.     amLODipine (NORVASC) 2.5 MG tablet Take 2.5 mg by mouth daily. (Patient not taking: No sig reported)     potassium chloride (KLOR-CON) 10 MEQ tablet Take 10 mEq by mouth daily. (Patient not taking: Reported on 05/19/2021)     No facility-administered medications prior to visit.    ROS See HPI  Objective:  BP 114/64 (BP Location: Right Arm, Patient Position: Sitting, Cuff Size: Normal)   Pulse 74   Temp (!) 96.7 F (  35.9 C) (Temporal)   Ht 5' 5.5" (1.664 m)   Wt 128 lb 6.4 oz (58.2 kg)   SpO2 98%   BMI 21.04 kg/m   Physical Exam Constitutional:      Comments: In wheelchair  Cardiovascular:     Rate and Rhythm: Normal rate.     Pulses: Normal pulses.  Pulmonary:     Effort: Pulmonary effort is normal.  Neurological:     Mental Status: She is alert.  Psychiatric:        Attention and Perception: Attention normal.        Mood and Affect: Mood is depressed.        Speech: Speech normal.        Behavior: Behavior is cooperative.        Cognition and Memory: Cognition and memory normal.   Assessment & Plan:  This visit occurred during the SARS-CoV-2 public health emergency.  Safety protocols were in place, including  screening questions prior to the visit, additional usage of staff PPE, and extensive cleaning of exam room while observing appropriate contact time as indicated for disinfecting solutions.   Nela was seen today for acute visit.  Diagnoses and all orders for this visit:  Depression, unspecified depression type -     Ambulatory referral to Psychology Encouraged [name] to get create a daily schedule which involves getting out of bed and performing exercises provided by PT. Advised about fall safety at home and use of walker. ADLs should be performed with supervision. Advised to get life alert if possible. Ok to use of companion service. Advised the difference of CNA vs personal companion, and insurance for these services. [name] opted to finding a personal companion.  Problem List Items Addressed This Visit       Other   Depression - Primary   Relevant Orders   Ambulatory referral to Psychology    I have spent 8mins with this patient regarding history taking, documentation, formulating plan and discussing treatment options with patient.   Follow-up: Return in about 4 weeks (around 06/16/2021) for generalized weakness and appetite.  Wilfred Lacy, NP

## 2021-05-19 NOTE — Patient Instructions (Signed)
Preventing Consequences of Unhealthy Weight Loss Behaviors, Adult Reaching and maintaining a healthy weight is important for your overall health. A healthy weight will vary from person to person. It is natural to want to lose weight quickly, using whatever methods seem fastest. However, losing weight in a healthy way is not a quick process. Instead, aim for slow, steady weight loss by making small changes and setting achievable goals. How can unhealthy weight loss behaviors affect me? Using unhealthy behaviors to try to lose weight can cause: Tiredness (fatigue), low heart rate, and low blood pressure. Imbalances in your body. These may be imbalances in: Electrolytes. These are salts and minerals in your blood. Chemicals. These are needed so your body can work properly. Body fluids. Loss of fluids may lead to dehydration. Organ damage or organ failure, especially affecting the kidneys. Thin bones that break easily. Loneliness or relationship problems with your friends and family. Emotional problems, including depression and anxiety. Changing unhealthy weight loss behaviors through lifestyle changes will improve your overall health. Maintaining a healthy weight also lowers your risk of certain conditions, such as: Obesity. Heart disease, high cholesterol, and high blood pressure. Type 2 diabetes. Breathing and sleeping disorders. Stroke. Osteoarthritis. This affects your joints. Osteoporosis. This affects your bones. Some cancers. What can increase my risk? Certain views or feelings about yourself and certain habits can increase your risk of unhealthy weight loss behaviors. These include: Having depression and being overweight as an adult. Attempting weight loss as a child or teen. Using alcohol, drugs, or tobacco products. What actions can I take to prevent these behaviors? You can make certain lifestyle changes to help you lose weight in a healthy way. These include eating nutritious  foods and exercising regularly. Nutrition  Eat a variety of healthy foods, including fruits and vegetables, whole grains, lean proteins, and low-fat dairy products. Drink water instead of sugary drinks such as juice, soda, or sports drinks. Drink enough fluid to keep your urine pale yellow. Plan healthy, balanced meals. Work with a Microbiologist to make a healthy meal plan that works for you. Limit the following: Foods that are high in fat, salt (sodium), or sugar. These include candy, donuts, pizza, and fast foods. Fried or heavily processed foods. Lifestyle Avoid these unhealthy eating habits: Following a diet that restricts entire types of food. This may be a popular diet that promises extreme results in a short time. Skipping meals to save calories. Not eating anything for long periods of time (fasting). Restricting your calories to far fewer than the number that you need to lose or maintain a healthy weight. Taking laxative pills to make you have more frequent bowel movements. Taking medicines to make your body lose excess fluids (diuretics). Eating an excessive amount of food and then making yourself vomit. This is known as bingeing and purging. Do not use any products that contain nicotine or tobacco. These products include cigarettes, chewing tobacco, and vaping devices, such as e-cigarettes. If you need help quitting, ask your health care provider. Alcohol use Do not drink alcohol if: Your health care provider tells you not to drink. You are pregnant, may be pregnant, or are planning to become pregnant. If you drink alcohol: Limit how much you have to: 0-1 drink a day for women. 0-2 drinks a day for men. Know how much alcohol is in your drink. In the [place] one drink equals one 12 oz bottle of beer (355 mL), one 5 oz glass of wine (148 mL), or one 1  oz glass of hard liquor (44 mL). Activity  Avoid compulsively getting an extreme amount of exercise. Work with a Microbiologist to make a  healthy exercise program. Include different types of exercise in your exercise program, such as strengthening, aerobic, and flexibility exercises. To maintain your weight, get at least 150 minutes of moderate-intensity exercise each week. Moderate-intensity exercise could be brisk walking or biking. To lose a healthy amount of weight, get 60 minutes of moderate-intensity exercise each day. Find ways to reduce stress, such as regular exercise or meditation. Find a hobby or other activity that you enjoy to distract you from eating when you feel stressed or bored. Where to find support For more support, talk with: Your health care provider or dietitian. Ask about support groups. A mental health care provider. Family and friends. Where to find more information Learn more about how to prevent complications from unhealthy weight loss behaviors from: Centers for Disease Control and Prevention: http://www.wolf.info/ National Institute of Mental Health: https://carter.com/ National Eating Disorders Association: www.nationaleatingdisorders.org Contact a health care provider if: You often feel very tired. You notice changes in your skin or your hair. You faint because of dehydration or too much exercise. You struggle to change your unhealthy weight loss behaviors on your own. Unhealthy weight loss behaviors are affecting your daily life or your relationships. You have signs or symptoms of an eating disorder. You have major weight changes in a short period of time. You feel guilty or ashamed about eating or exercising. Summary Using unhealthy eating behaviors to try to lose weight can cause a variety of physical and emotional problems that affect your overall health and well-being. Aim for slow, steady weight loss by choosing healthy foods, avoiding unhealthy eating habits, and exercising regularly. Contact your health care provider if you struggle to change your behaviors on your own or if you think that you may  have an eating disorder. This information is not intended to replace advice given to you by your health care provider. Make sure you discuss any questions you have with your health care provider. Document Revised: 02/14/2021 Document Reviewed: 02/14/2021 Elsevier Patient Education  2022 [employer].   Exercise Information for Aging Adults Staying physically active is important as you age. Physical activity and exercise can help in maintaining quality of life, health, physical function, and reducing falls. The four types of exercises that are best for older adults are endurance, strength, balance, and flexibility. Contact your health care provider before you start any exercise routine. Ask your health care provider what activities are safe for you. What are the risks? Risks associated with exercising include: Overdoing it. This may lead to sore muscles or fatigue. Falls. Injuries. Dehydration. How to do these exercises Endurance exercises Endurance (aerobic) exercises raise your breathing rate and heart rate. Increasing your endurance helps you do everyday tasks and stay healthy. By improving the health of your body system that includes your heart, lungs, and blood vessels (circulatory system), you may also delay or prevent diseases such as heart disease, diabetes, and weak bones (osteoporosis). Types of endurance exercises include: Sports. Indoor activities, such as using gym equipment, doing water aerobics, or dancing. Outdoor activities, such as biking or jogging. Tasks around the house, such as gardening, yard work, and heavy household chores like cleaning. Walking, such as hiking or walking around your neighborhood. When doing endurance exercises, make sure you: Are aware of your surroundings. Use safety equipment as directed. Dress in layers when exercising outdoors. Drink plenty of water  to stay well hydrated. Build up endurance slowly. Start with 10 minutes at a time, and  gradually build up to doing 30 minutes at a time. Unless your health care provider gave you different instructions, aim to exercise for a total of 150 minutes a week. Spread out that time so you are working on endurance 3 or more days a week. Strength exercises Lifting, pulling, or pushing weights helps to strengthen muscles. Having stronger muscles makes it easier to do everyday activities, such as getting up from a chair, climbing stairs, carrying groceries, and playing with grandchildren. Strength exercises include arm and leg exercises that may be done: With weights. Without weights (using your own body weight). With a resistance band. When doing strength exercises: Move smoothly and steadily. Do not suddenly thrust or jerk the weights, the resistance band, or your body. Start with no weights or with light weights, and gradually add more weight over time. Eventually, aim to use weights that are hard or very hard for you to lift. This means that you are able to do 8 repetitions with the weight, and the last few repetitions are very challenging. Lift or push weights into position for 3 seconds, hold the position for 1 second, and then take 3 seconds to return to your starting position. Breathe out (exhale) during difficult movements, like lifting or pushing weights. Breathe in (inhale) to relax your muscles before the next repetition. Consider alternating arms or legs, especially when you first start strength exercises. Expect some slight muscle soreness after each session. Do strength exercises on 2 or more days a week, for 30 minutes at a time. Avoid exercising the same muscle groups two days in a row. For example, if you work on your leg muscles one day, work on your arm muscles the next day. When you can do two sets of 10-15 repetitions with a certain weight, increase the amount of weight. Balance exercises Balance exercises can help to prevent falls. Balance exercises include: Standing on one  foot. Heel-to-toe walk. Balance walk. Tai chi. Make sure you have something sturdy to hold onto while doing balance exercises, such as a sturdy chair. As your balance improves, challenge yourself by holding on to the chair with one hand instead of two, and then with no hands. Trying exercises with your eyes closed also challenges your balance, but be sure to have a sturdy surface (like a countertop) close by in case you need it. Do balance exercises as often as you want, or as often as directed by your health care provider. Flexibility exercises Flexibility exercises improve how far you can bend, straighten, move, or rotate parts of your body (range of motion). These exercises also help you do everyday activities such as getting dressed or reaching for objects. Flexibility exercises include stretching different parts of the body, and they may be done in a standing or seated position or on the floor. When stretching, make sure you: Keep a slight bend in your arms and legs. Avoid completely straightening ("locking") your joints. Do not stretch so far that you feel pain. You should feel a mild stretching feeling. You may try stretching farther as you become more flexible over time. Relax and breathe between stretches. Hold on to something sturdy for balance as needed. Hold each stretch for 10-30 seconds. Repeat each stretch 3-5 times. General safety tips Exercise in well-lit areas. Do not hold your breath during exercises or stretches. Warm up before exercising, and cool down after exercising. This can help  prevent injury. Drink plenty of water during exercise or any activity that makes you sweat. If you are not sure if an exercise is safe for you, or you are not sure how to do an exercise, talk with your health care provider. This is especially important if you have had surgery on muscles, bones, or joints (orthopedic surgery). Where to find more information You can find more information about  exercise for older adults from: Your local health department, fitness center, or community center. These facilities may have programs for aging adults. [employer] on Aging: http://kim-miller.com/ National Council on Aging: www.ncoa.org Summary Staying physically active is important as you age. Doing endurance, strength, balance, and flexibility exercises can help in maintaining quality of life, health, physical function, and reducing falls. Make sure to contact your health care provider before you start any exercise routine. Ask your health care provider what activities are safe for you. This information is not intended to replace advice given to you by your health care provider. Make sure you discuss any questions you have with your health care provider. Document Revised: 11/14/2020 Document Reviewed: 11/14/2020 Elsevier Patient Education  [place].

## 2021-05-22 DIAGNOSIS — I4819 Other persistent atrial fibrillation: Secondary | ICD-10-CM | POA: Diagnosis not present

## 2021-05-22 DIAGNOSIS — D62 Acute posthemorrhagic anemia: Secondary | ICD-10-CM | POA: Diagnosis not present

## 2021-05-22 DIAGNOSIS — J45909 Unspecified asthma, uncomplicated: Secondary | ICD-10-CM | POA: Diagnosis not present

## 2021-05-22 DIAGNOSIS — I130 Hypertensive heart and chronic kidney disease with heart failure and stage 1 through stage 4 chronic kidney disease, or unspecified chronic kidney disease: Secondary | ICD-10-CM | POA: Diagnosis not present

## 2021-05-22 DIAGNOSIS — K269 Duodenal ulcer, unspecified as acute or chronic, without hemorrhage or perforation: Secondary | ICD-10-CM | POA: Diagnosis not present

## 2021-05-22 DIAGNOSIS — K5790 Diverticulosis of intestine, part unspecified, without perforation or abscess without bleeding: Secondary | ICD-10-CM | POA: Diagnosis not present

## 2021-05-23 DIAGNOSIS — I130 Hypertensive heart and chronic kidney disease with heart failure and stage 1 through stage 4 chronic kidney disease, or unspecified chronic kidney disease: Secondary | ICD-10-CM | POA: Diagnosis not present

## 2021-05-23 DIAGNOSIS — I4819 Other persistent atrial fibrillation: Secondary | ICD-10-CM | POA: Diagnosis not present

## 2021-05-23 DIAGNOSIS — D62 Acute posthemorrhagic anemia: Secondary | ICD-10-CM | POA: Diagnosis not present

## 2021-05-23 DIAGNOSIS — K5790 Diverticulosis of intestine, part unspecified, without perforation or abscess without bleeding: Secondary | ICD-10-CM | POA: Diagnosis not present

## 2021-05-23 DIAGNOSIS — K269 Duodenal ulcer, unspecified as acute or chronic, without hemorrhage or perforation: Secondary | ICD-10-CM | POA: Diagnosis not present

## 2021-05-23 DIAGNOSIS — J45909 Unspecified asthma, uncomplicated: Secondary | ICD-10-CM | POA: Diagnosis not present

## 2021-05-24 ENCOUNTER — Encounter (HOSPITAL_COMMUNITY)
Admission: RE | Admit: 2021-05-24 | Discharge: 2021-05-24 | Disposition: A | Discharge status: Home / Self Care | Payer: Medicare Other | Source: Ambulatory Visit | Attending: Nephrology | Admitting: Nephrology

## 2021-05-24 ENCOUNTER — Other Ambulatory Visit: Payer: Self-pay

## 2021-05-24 VITALS — BP 146/81 | HR 71 | Temp 97.4°F

## 2021-05-24 DIAGNOSIS — N185 Chronic kidney disease, stage 5: Secondary | ICD-10-CM | POA: Diagnosis not present

## 2021-05-24 DIAGNOSIS — I120 Hypertensive chronic kidney disease with stage 5 chronic kidney disease or end stage renal disease: Secondary | ICD-10-CM | POA: Diagnosis not present

## 2021-05-24 DIAGNOSIS — D631 Anemia in chronic kidney disease: Secondary | ICD-10-CM | POA: Insufficient documentation

## 2021-05-24 LAB — IRON AND TIBC
Iron: 82 ug/dL (ref 28–170)
Saturation Ratios: 47 % — ABNORMAL HIGH (ref 10.4–31.8)
TIBC: 175 ug/dL — ABNORMAL LOW (ref 250–450)
UIBC: 93 ug/dL

## 2021-05-24 LAB — POCT HEMOGLOBIN-HEMACUE: Hemoglobin: 10.8 g/dL — ABNORMAL LOW (ref 12.0–15.0)

## 2021-05-24 LAB — FERRITIN: Ferritin: 712 ng/mL — ABNORMAL HIGH (ref 11–307)

## 2021-05-24 MED ORDER — EPOETIN ALFA-EPBX 10000 UNIT/ML IJ SOLN
10000.0000 [IU] | INTRAMUSCULAR | Status: DC
  Administered 2021-05-24: 10000 [IU] via SUBCUTANEOUS

## 2021-05-24 MED ORDER — EPOETIN ALFA-EPBX 10000 UNIT/ML IJ SOLN
INTRAMUSCULAR | Status: AC
  Filled 2021-05-24: qty 1

## 2021-05-24 NOTE — Telephone Encounter (Signed)
Pt husband called stating nothing has changed with his wife's mental state. I am not sure he realizes the referral was put in for the psychologist. Please call him.

## 2021-05-26 ENCOUNTER — Telehealth: Payer: Self-pay

## 2021-05-26 DIAGNOSIS — I5030 Unspecified diastolic (congestive) heart failure: Secondary | ICD-10-CM | POA: Diagnosis not present

## 2021-05-26 DIAGNOSIS — Z86718 Personal history of other venous thrombosis and embolism: Secondary | ICD-10-CM | POA: Diagnosis not present

## 2021-05-26 DIAGNOSIS — D849 Immunodeficiency, unspecified: Secondary | ICD-10-CM | POA: Diagnosis not present

## 2021-05-26 DIAGNOSIS — N184 Chronic kidney disease, stage 4 (severe): Secondary | ICD-10-CM | POA: Diagnosis not present

## 2021-05-26 DIAGNOSIS — Z79899 Other long term (current) drug therapy: Secondary | ICD-10-CM | POA: Diagnosis not present

## 2021-05-26 DIAGNOSIS — Z7952 Long term (current) use of systemic steroids: Secondary | ICD-10-CM | POA: Diagnosis not present

## 2021-05-26 DIAGNOSIS — E875 Hyperkalemia: Secondary | ICD-10-CM | POA: Diagnosis not present

## 2021-05-26 DIAGNOSIS — Z940 Kidney transplant status: Secondary | ICD-10-CM | POA: Diagnosis not present

## 2021-05-26 DIAGNOSIS — I4819 Other persistent atrial fibrillation: Secondary | ICD-10-CM | POA: Diagnosis not present

## 2021-05-26 DIAGNOSIS — Z9181 History of falling: Secondary | ICD-10-CM | POA: Diagnosis not present

## 2021-05-26 DIAGNOSIS — J45909 Unspecified asthma, uncomplicated: Secondary | ICD-10-CM | POA: Diagnosis not present

## 2021-05-26 DIAGNOSIS — I130 Hypertensive heart and chronic kidney disease with heart failure and stage 1 through stage 4 chronic kidney disease, or unspecified chronic kidney disease: Secondary | ICD-10-CM | POA: Diagnosis not present

## 2021-05-26 DIAGNOSIS — F32A Depression, unspecified: Secondary | ICD-10-CM | POA: Diagnosis not present

## 2021-05-26 DIAGNOSIS — Z7982 Long term (current) use of aspirin: Secondary | ICD-10-CM | POA: Diagnosis not present

## 2021-05-26 DIAGNOSIS — Z7951 Long term (current) use of inhaled steroids: Secondary | ICD-10-CM | POA: Diagnosis not present

## 2021-05-26 DIAGNOSIS — D62 Acute posthemorrhagic anemia: Secondary | ICD-10-CM | POA: Diagnosis not present

## 2021-05-26 DIAGNOSIS — K269 Duodenal ulcer, unspecified as acute or chronic, without hemorrhage or perforation: Secondary | ICD-10-CM | POA: Diagnosis not present

## 2021-05-26 DIAGNOSIS — K5790 Diverticulosis of intestine, part unspecified, without perforation or abscess without bleeding: Secondary | ICD-10-CM | POA: Diagnosis not present

## 2021-05-26 DIAGNOSIS — G4733 Obstructive sleep apnea (adult) (pediatric): Secondary | ICD-10-CM | POA: Diagnosis not present

## 2021-05-26 DIAGNOSIS — Z8673 Personal history of transient ischemic attack (TIA), and cerebral infarction without residual deficits: Secondary | ICD-10-CM | POA: Diagnosis not present

## 2021-05-26 NOTE — Progress Notes (Signed)
Chronic Care Management Pharmacy Assistant   Name: [name]  MRN: [id number] DOB: May 21, 1946  Reason for Encounter:Hypertension Disease State Call.   Recent office visits:  05/19/2021 Wilfred Lacy NP (PCP) Patient  not taking Amlodipine and Potassium, Ambulatory referral to Psychology,Return in about 4 weeks  Recent consult visits:  No recent consult Visit   Hospital visits:  None in previous 6 months  Medications: Outpatient Encounter Medications as of 05/26/2021  Medication Sig   acetaminophen (TYLENOL) 500 MG tablet Take 500 mg by mouth every 6 (six) hours as needed for mild pain or headache.    albuterol (PROVENTIL) (2.5 MG/3ML) 0.083% nebulizer solution Take 3 mLs (2.5 mg total) by nebulization every 6 (six) hours as needed for wheezing or shortness of breath. Dx:J45.909   aspirin EC 81 MG tablet Take 81 mg by mouth daily. Swallow whole.   budesonide-formoterol (SYMBICORT) 160-4.5 MCG/ACT inhaler Inhale 2 puffs into the lungs 2 (two) times daily.   calcitRIOL (ROCALTROL) 0.25 MCG capsule Take 0.25 mcg by mouth daily.   carvedilol (COREG) 12.5 MG tablet Take 2 tablets (25 mg total) by mouth 2 (two) times daily.   docusate sodium (COLACE) 100 MG capsule Take 100 mg by mouth daily.   DULoxetine (CYMBALTA) 30 MG capsule TAKE 1 CAPSULE BY MOUTH EVERY DAY (Patient taking differently: Take 30 mg by mouth daily.)   Ferrous Sulfate (IRON PO) Take 325 mg by mouth daily.    furosemide (LASIX) 80 MG tablet Take 120 mg by mouth daily.   mycophenolate (MYFORTIC) 180 MG EC tablet Take 180 mg by mouth 2 (two) times daily.   ondansetron (ZOFRAN) 4 MG tablet Take 1 tablet (4 mg total) by mouth every 8 (eight) hours as needed for up to 9 doses for nausea.   pantoprazole (PROTONIX) 40 MG tablet Take 1 tablet (40 mg total) by mouth 2 (two) times daily.   predniSONE (DELTASONE) 5 MG tablet Take 5 mg by mouth daily.   tacrolimus (PROGRAF) 1 MG capsule Take 1-2 mg by mouth See admin  instructions. 2 mg in the morning 1 mg at bedtime   VENTOLIN HFA 108 (90 Base) MCG/ACT inhaler Inhale 2 puffs into the lungs 2 (two) times daily as needed.   No facility-administered encounter medications on file as of 05/26/2021.    Care Gaps: Shingrix Vaccine COVID-19 Vaccine (4- Booster) Star Rating Drugs: None Medication Fill Gaps: None  Reviewed chart prior to disease state call. Spoke with patient regarding BP  Recent Office Vitals: BP Readings from Last 3 Encounters:  05/24/21 (!) 146/81  05/19/21 114/64  05/10/21 134/72   Pulse Readings from Last 3 Encounters:  05/24/21 71  05/19/21 74  05/10/21 70    Wt Readings from Last 3 Encounters:  05/19/21 128 lb 6.4 oz (58.2 kg)  04/26/21 133 lb (60.3 kg)  03/29/21 141 lb 9.6 oz (64.2 kg)     Kidney Function Lab Results  Component Value Date/Time   CREATININE 3.25 (H) 03/29/2021 02:52 PM   CREATININE 3.02 (H) 03/25/2021 02:23 AM   GFR 13.66 (LL) 03/29/2021 02:52 PM   GFRNONAA 16 (L) 03/25/2021 02:23 AM   GFRAA 24 (L) 03/13/2020 11:18 AM    BMP Latest Ref Rng & Units 03/29/2021 03/25/2021 03/24/2021  Glucose 70 - 99 mg/dL 101(H) 97 93  BUN 6 - 23 mg/dL 30(H) 42(H) 52(H)  Creatinine 0.40 - 1.20 mg/dL 3.25(H) 3.02(H) 3.26(H)  BUN/Creat Ratio 12 - 28 - - -  Sodium 135 -  145 mEq/L 140 141 139  Potassium 3.5 - 5.1 mEq/L 3.8 3.1(L) 3.4(L)  Chloride 96 - 112 mEq/L 110 115(H) 114(H)  CO2 19 - 32 mEq/L 23 18(L) 16(L)  Calcium 8.4 - 10.5 mg/dL 8.4 8.3(L) 8.1(L)    Current antihypertensive regimen:  Carvediolol 12.5 mg  Patient denies any headaches,dizziness or lightheadedness.  How often are you checking your Blood Pressure? daily Current home BP readings:  Patient states her blood pressure ranges from 120's-130's/80's. What recent interventions/DTPs have been made by any provider to improve Blood Pressure control since last CPP Visit: None  Any recent hospitalizations or ED visits since last visit with CPP? No What  diet changes have been made to improve Blood Pressure Control?  Patient denies any changes in her diet. What exercise is being done to improve your Blood Pressure Control?  Patient states she exercise as much as she can and attend physical therapy.  Adherence Review: Is the patient currently on ACE/ARB medication? No Does the patient have >5 day gap between last estimated fill dates? No   Patient schedule a Telephone follow up appointment with Care management team member for : 07/27/2020 at 3:45 pm.  Anderson Malta Clinical Pharmacist Assistant [phone]

## 2021-05-29 ENCOUNTER — Telehealth: Payer: Self-pay | Admitting: Family Medicine

## 2021-05-29 DIAGNOSIS — N2581 Secondary hyperparathyroidism of renal origin: Secondary | ICD-10-CM | POA: Diagnosis not present

## 2021-05-29 DIAGNOSIS — E876 Hypokalemia: Secondary | ICD-10-CM | POA: Diagnosis not present

## 2021-05-29 DIAGNOSIS — I129 Hypertensive chronic kidney disease with stage 1 through stage 4 chronic kidney disease, or unspecified chronic kidney disease: Secondary | ICD-10-CM | POA: Diagnosis not present

## 2021-05-29 DIAGNOSIS — I4891 Unspecified atrial fibrillation: Secondary | ICD-10-CM | POA: Diagnosis not present

## 2021-05-29 DIAGNOSIS — R809 Proteinuria, unspecified: Secondary | ICD-10-CM | POA: Diagnosis not present

## 2021-05-29 DIAGNOSIS — Z940 Kidney transplant status: Secondary | ICD-10-CM | POA: Diagnosis not present

## 2021-05-29 DIAGNOSIS — D631 Anemia in chronic kidney disease: Secondary | ICD-10-CM | POA: Diagnosis not present

## 2021-05-29 DIAGNOSIS — Z45010 Encounter for checking and testing of cardiac pacemaker pulse generator [battery]: Secondary | ICD-10-CM | POA: Diagnosis not present

## 2021-05-29 DIAGNOSIS — N179 Acute kidney failure, unspecified: Secondary | ICD-10-CM | POA: Diagnosis not present

## 2021-05-29 DIAGNOSIS — I4819 Other persistent atrial fibrillation: Secondary | ICD-10-CM | POA: Diagnosis not present

## 2021-05-29 DIAGNOSIS — D849 Immunodeficiency, unspecified: Secondary | ICD-10-CM | POA: Diagnosis not present

## 2021-05-29 DIAGNOSIS — N184 Chronic kidney disease, stage 4 (severe): Secondary | ICD-10-CM | POA: Diagnosis not present

## 2021-05-29 DIAGNOSIS — Z950 Presence of cardiac pacemaker: Secondary | ICD-10-CM | POA: Diagnosis not present

## 2021-05-29 DIAGNOSIS — K922 Gastrointestinal hemorrhage, unspecified: Secondary | ICD-10-CM | POA: Diagnosis not present

## 2021-05-30 DIAGNOSIS — I4819 Other persistent atrial fibrillation: Secondary | ICD-10-CM | POA: Diagnosis not present

## 2021-05-30 DIAGNOSIS — N184 Chronic kidney disease, stage 4 (severe): Secondary | ICD-10-CM | POA: Diagnosis not present

## 2021-05-30 DIAGNOSIS — K5790 Diverticulosis of intestine, part unspecified, without perforation or abscess without bleeding: Secondary | ICD-10-CM | POA: Diagnosis not present

## 2021-05-30 DIAGNOSIS — I130 Hypertensive heart and chronic kidney disease with heart failure and stage 1 through stage 4 chronic kidney disease, or unspecified chronic kidney disease: Secondary | ICD-10-CM | POA: Diagnosis not present

## 2021-05-30 DIAGNOSIS — I5030 Unspecified diastolic (congestive) heart failure: Secondary | ICD-10-CM | POA: Diagnosis not present

## 2021-05-30 DIAGNOSIS — K269 Duodenal ulcer, unspecified as acute or chronic, without hemorrhage or perforation: Secondary | ICD-10-CM | POA: Diagnosis not present

## 2021-05-30 NOTE — Telephone Encounter (Signed)
Called [facility] to give verbal orders, no answer left detailed message on VM asked to call back with any concerns.

## 2021-05-30 NOTE — Telephone Encounter (Signed)
Please advise message below, okay to give verbal order?

## 2021-06-01 ENCOUNTER — Ambulatory Visit: Payer: Medicare Other

## 2021-06-02 DIAGNOSIS — N184 Chronic kidney disease, stage 4 (severe): Secondary | ICD-10-CM | POA: Diagnosis not present

## 2021-06-02 DIAGNOSIS — I130 Hypertensive heart and chronic kidney disease with heart failure and stage 1 through stage 4 chronic kidney disease, or unspecified chronic kidney disease: Secondary | ICD-10-CM | POA: Diagnosis not present

## 2021-06-02 DIAGNOSIS — I4819 Other persistent atrial fibrillation: Secondary | ICD-10-CM | POA: Diagnosis not present

## 2021-06-02 DIAGNOSIS — K269 Duodenal ulcer, unspecified as acute or chronic, without hemorrhage or perforation: Secondary | ICD-10-CM | POA: Diagnosis not present

## 2021-06-02 DIAGNOSIS — K5790 Diverticulosis of intestine, part unspecified, without perforation or abscess without bleeding: Secondary | ICD-10-CM | POA: Diagnosis not present

## 2021-06-02 DIAGNOSIS — I5030 Unspecified diastolic (congestive) heart failure: Secondary | ICD-10-CM | POA: Diagnosis not present

## 2021-06-05 DIAGNOSIS — N184 Chronic kidney disease, stage 4 (severe): Secondary | ICD-10-CM | POA: Diagnosis not present

## 2021-06-05 DIAGNOSIS — I130 Hypertensive heart and chronic kidney disease with heart failure and stage 1 through stage 4 chronic kidney disease, or unspecified chronic kidney disease: Secondary | ICD-10-CM | POA: Diagnosis not present

## 2021-06-05 DIAGNOSIS — K5790 Diverticulosis of intestine, part unspecified, without perforation or abscess without bleeding: Secondary | ICD-10-CM | POA: Diagnosis not present

## 2021-06-05 DIAGNOSIS — I5030 Unspecified diastolic (congestive) heart failure: Secondary | ICD-10-CM | POA: Diagnosis not present

## 2021-06-05 DIAGNOSIS — K269 Duodenal ulcer, unspecified as acute or chronic, without hemorrhage or perforation: Secondary | ICD-10-CM | POA: Diagnosis not present

## 2021-06-05 DIAGNOSIS — I4819 Other persistent atrial fibrillation: Secondary | ICD-10-CM | POA: Diagnosis not present

## 2021-06-06 DIAGNOSIS — I5030 Unspecified diastolic (congestive) heart failure: Secondary | ICD-10-CM | POA: Diagnosis not present

## 2021-06-06 DIAGNOSIS — N184 Chronic kidney disease, stage 4 (severe): Secondary | ICD-10-CM | POA: Diagnosis not present

## 2021-06-06 DIAGNOSIS — K5790 Diverticulosis of intestine, part unspecified, without perforation or abscess without bleeding: Secondary | ICD-10-CM | POA: Diagnosis not present

## 2021-06-06 DIAGNOSIS — I4819 Other persistent atrial fibrillation: Secondary | ICD-10-CM | POA: Diagnosis not present

## 2021-06-06 DIAGNOSIS — I130 Hypertensive heart and chronic kidney disease with heart failure and stage 1 through stage 4 chronic kidney disease, or unspecified chronic kidney disease: Secondary | ICD-10-CM | POA: Diagnosis not present

## 2021-06-06 DIAGNOSIS — K269 Duodenal ulcer, unspecified as acute or chronic, without hemorrhage or perforation: Secondary | ICD-10-CM | POA: Diagnosis not present

## 2021-06-07 ENCOUNTER — Encounter (HOSPITAL_COMMUNITY)
Admission: RE | Admit: 2021-06-07 | Discharge: 2021-06-07 | Disposition: A | Discharge status: Home / Self Care | Payer: Medicare Other | Source: Ambulatory Visit | Attending: Nephrology | Admitting: Nephrology

## 2021-06-07 ENCOUNTER — Other Ambulatory Visit: Payer: Self-pay

## 2021-06-07 ENCOUNTER — Encounter (HOSPITAL_COMMUNITY): Payer: Medicare Other

## 2021-06-07 VITALS — BP 149/79 | HR 72 | Temp 97.3°F | Resp 14

## 2021-06-07 DIAGNOSIS — D631 Anemia in chronic kidney disease: Secondary | ICD-10-CM | POA: Diagnosis not present

## 2021-06-07 DIAGNOSIS — N185 Chronic kidney disease, stage 5: Secondary | ICD-10-CM | POA: Diagnosis not present

## 2021-06-07 DIAGNOSIS — I120 Hypertensive chronic kidney disease with stage 5 chronic kidney disease or end stage renal disease: Secondary | ICD-10-CM | POA: Diagnosis not present

## 2021-06-07 LAB — POCT HEMOGLOBIN-HEMACUE: Hemoglobin: 9.8 g/dL — ABNORMAL LOW (ref 12.0–15.0)

## 2021-06-07 MED ORDER — EPOETIN ALFA-EPBX 10000 UNIT/ML IJ SOLN
10000.0000 [IU] | INTRAMUSCULAR | Status: DC
  Administered 2021-06-07: 10000 [IU] via SUBCUTANEOUS

## 2021-06-07 MED ORDER — EPOETIN ALFA-EPBX 10000 UNIT/ML IJ SOLN
INTRAMUSCULAR | Status: AC
  Filled 2021-06-07: qty 1

## 2021-06-12 ENCOUNTER — Other Ambulatory Visit: Payer: Self-pay | Admitting: Pulmonary Disease

## 2021-06-12 DIAGNOSIS — J4541 Moderate persistent asthma with (acute) exacerbation: Secondary | ICD-10-CM

## 2021-06-13 ENCOUNTER — Other Ambulatory Visit: Payer: Self-pay

## 2021-06-13 ENCOUNTER — Ambulatory Visit (INDEPENDENT_AMBULATORY_CARE_PROVIDER_SITE_OTHER): Payer: Medicare Other

## 2021-06-13 ENCOUNTER — Ambulatory Visit (INDEPENDENT_AMBULATORY_CARE_PROVIDER_SITE_OTHER): Payer: Medicare Other | Admitting: Pulmonary Disease

## 2021-06-13 ENCOUNTER — Encounter: Payer: Self-pay | Admitting: Pulmonary Disease

## 2021-06-13 VITALS — BP 150/86 | HR 71 | Temp 97.7°F | Ht 66.0 in | Wt 125.8 lb

## 2021-06-13 DIAGNOSIS — J811 Chronic pulmonary edema: Secondary | ICD-10-CM | POA: Diagnosis not present

## 2021-06-13 DIAGNOSIS — J4540 Moderate persistent asthma, uncomplicated: Secondary | ICD-10-CM | POA: Diagnosis not present

## 2021-06-13 DIAGNOSIS — I2729 Other secondary pulmonary hypertension: Secondary | ICD-10-CM

## 2021-06-13 DIAGNOSIS — I517 Cardiomegaly: Secondary | ICD-10-CM | POA: Diagnosis not present

## 2021-06-13 DIAGNOSIS — J4531 Mild persistent asthma with (acute) exacerbation: Secondary | ICD-10-CM | POA: Diagnosis not present

## 2021-06-13 DIAGNOSIS — J45909 Unspecified asthma, uncomplicated: Secondary | ICD-10-CM | POA: Diagnosis not present

## 2021-06-13 IMAGING — DX DG CHEST 2V
2 series · 2 of 2 positions shown · non-contrast
Comparison: X-ray chest [DATE].

CLINICAL DATA: Asthma

EXAM:
CHEST - 2 VIEW

[chest pa]
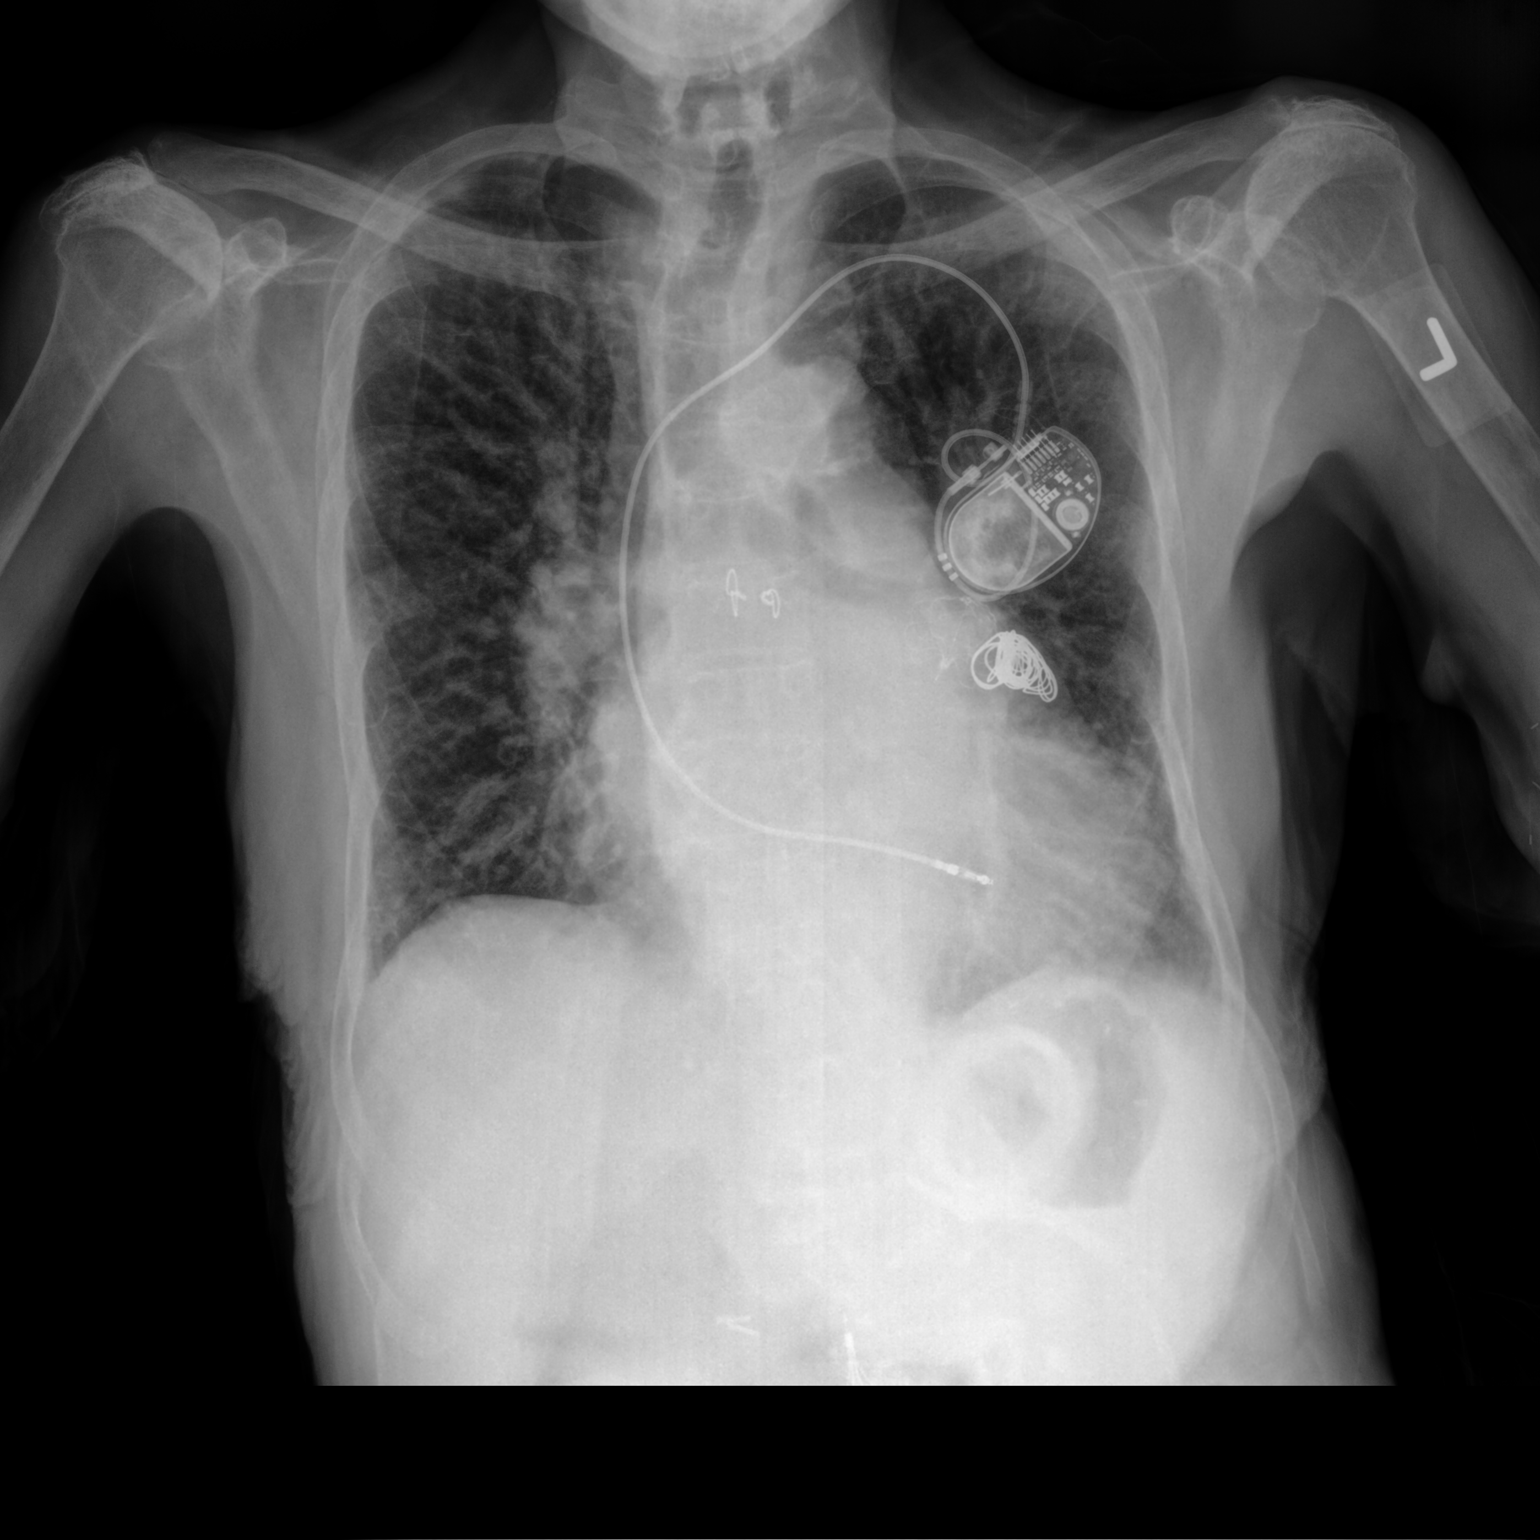

[chest lat]
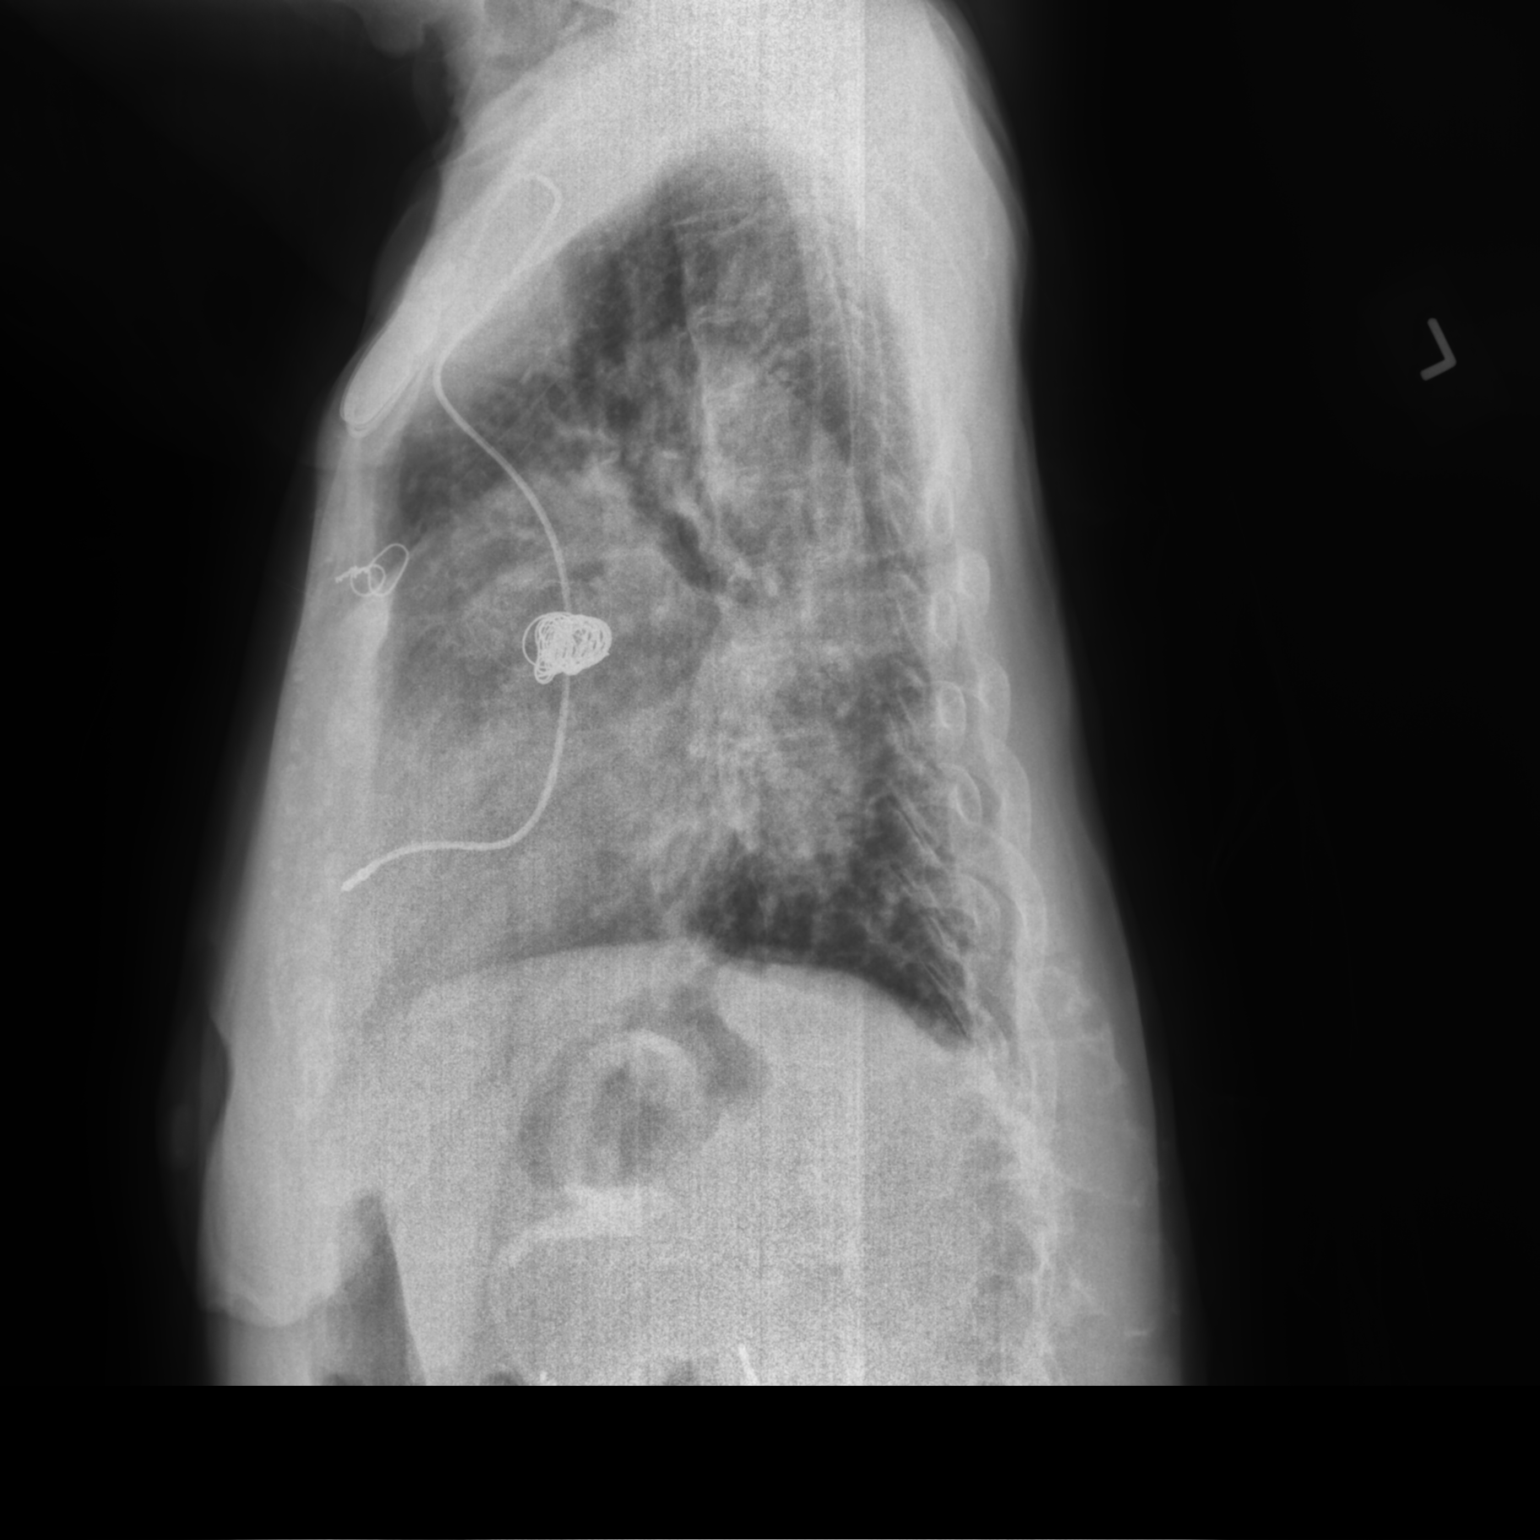

[2 of 2 positions shown; findings below may reference images not displayed]

FINDINGS: Chronic interstitial changes. Pulmonary vascular congestion.
Cardiomegaly. No pleural effusion or pneumothorax. Single lead left
chest wall pacemaker and left atrial appendage occlusion.
Cholecystectomy clips, IVC filter, and lap band are present.
IMPRESSION: Pulmonary vascular congestion with cardiomegaly.

## 2021-06-13 MED ORDER — METHYLPREDNISOLONE ACETATE 80 MG/ML IJ SUSP
80.0000 mg | Freq: Once | INTRAMUSCULAR | Status: AC
  Administered 2021-06-13: 80 mg via INTRAMUSCULAR

## 2021-06-13 MED ORDER — BUDESONIDE-FORMOTEROL FUMARATE 160-4.5 MCG/ACT IN AERO: 2.0000 | INHALATION_SPRAY | Freq: Two times a day (BID) | RESPIRATORY_TRACT | 5 refills | Status: AC

## 2021-06-13 NOTE — Patient Instructions (Signed)
Refills on symbicort x 5 Solumedrol 80 mg IM x 1 CXR now   Take albuterol neb when you get home

## 2021-06-13 NOTE — Progress Notes (Signed)
Subjective:    Patient ID: EMONIE ESPERICUETA, female    DOB: 29-Jul-1945, 75 y.o.   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  She reports asthma as a child but this never bothered her as an adult.    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  HFpEF and atrial fibrillation status post ablation in 2017.  03/2018 PPM  implant and AV node ablation , amio was stopped   01/2018 AKI due to supratherapeutic tacrolimus, required transient CRRT at [facility] seen 05/2020 She ran out of her Symbicort last week, now reports increased shortness of breath and wheezing for the past 3 days. Weight has decreased from 145 to 125 pounds. She reports stable chronic edema of her right leg No sick contacts, husband had a sinus drip, no fevers All vaccinations are up-to-date   Significant tests/ events reviewed  Spirometry 09/2017 significant restriction with FVC 25%, FEV1 28%.  Ratio 87.  Decreased mid flows.    PFTs 11/2017-moderate restriction, FVC 53%, TLC 55%, DLCO 43%, no obstruction   12/2018-HRCT chest limited by respiratory motion, no definitive evidence of fibrotic interstitial lung disease, air trapping indicative of small airways disease, aortic arthrosclerosis, enlarged pulmonary arteries     10/2019-echo-LVEF 55 to 60%, D-shaped left ventricle consistent with right ventricular volume overload, RVSP 60.7, left atrial size was severely dilated, right atrial size was severely dilated   HRCT 11/2017-mild patchy subpleural reticulation and groundglass bilateral lungs without significant traction bronchiectasis or frank honeycombing.  Findings nonspecific may represent mild postinfectious/inflammatory  changes.    Past Medical History:  Diagnosis Date   Acute diastolic (congestive) heart failure (HCC) 08/12/2017   Acute GI bleeding 11/14/2019   Acute kidney injury superimposed on chronic kidney disease ([place]) 11/17/2017   AKI (acute kidney injury) ([place]) 11/04/2017   Anemia    Anxiety    ARF (acute renal failure) ([place]) 06/06/2011   Arthritis    "shoulders; arms; hips" (02/04/2018)   Asthma    Atrial fibrillation (HCC)    Atrial flutter ([place])    s/p aflutter ablation at [facility]   Bacteriuria, asymptomatic 11/14/2020   Benign hypertension with ESRD (end-stage renal disease) ([place])    Blood transfusion    never had a reaction to blood transfusion   Cardiomyopathy Mar 2013   Mild, EF 50-55% by Mar 2013 ECHO, diast dysfxn II   Cardiomyopathy ([place])    CHF (congestive heart failure) ([place]) 2005   CHF (congestive heart failure) ([place])    Childhood asthma    Closed fracture of right distal radius 10/18/2016   CMV (cytomegalovirus infection) ([place]) 10/03/2015   Constipation    takes Miralax daily   Constipation    Depression    takes Zoloft daily   Distal radius fracture, right    DVT of lower extremity, bilateral ([place]) 12/21/11   "they're there now; been there for 2 wks"   Eczema    End stage renal disease ([place]) 11/06/2011   ESRD (end stage renal disease) on dialysis [facility]) 09/2011   07-19-2015 had Kidney transplant at [facility]; "don't get dialysis anymore" (02/04/2018)   Essential hypertension 07/07/2007   Qualifier:  Diagnosis of  By: Garen Grams     Fractures, stress    in both feet--6 OR 7 YRS AGO--RESOLVED   Generalized edema [id number]   GI bleed 11/14/2019   Gout    doesn't require meds    HCAP (healthcare-associated pneumonia) 09/10/2011   Hearing difficulty    Hematoma of left lower leg 05/17/2020   High cholesterol    History of CVA (cerebrovascular accident) without residual deficits    History of hip replacement, total, right    History of pneumonia    Hx of Clostridium difficile  infection    Hypercalcemia    09/10/11   Hyperparathyroidism ([place]) 04/07/2013   Hypertension    takes Diltiazem daily    Hypomagnesemia 08/02/2015   Hypophosphatemia 11/08/2017   ICB (intracranial bleed) ([place])    Immunosuppression ([place]) 07/25/2015   Memory changes    Morbid obesity ([place])    Nonischemic dilated cardiomyopathy ([place])    Obesity 04/07/2013   Oligouria    Oral mucositis 02/22/2018   OSA on CPAP    PAF (paroxysmal atrial fibrillation) (HCC)     HX OF CEREBRAL BLEED WHILE ON COUMADIN-SO PT NOT ON ANY BLOOD THINNERS NOW   Peripheral vascular disease ([place])    Persistent atrial fibrillation ([place]) 08/12/2017   Overview:  Added automatically from request for surgery [phone]  Formatting of this note might be different from the original. 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 Also denies presyncope, palpitations, heartburn, abdominal pain, nausea, vomiting, diarrhea or change in bowel or urinary habits, dysuria,hematuria, rash, arthralgias, visual complaints, headache, numbness weakness or ataxia.     Objective:   Physical Exam  Gen.  Pleasant, thin woman, in wheelchair in no distress ENT - no thrush, no pallor/icterus,no post nasal drip Neck: No JVD, no thyromegaly, no carotid bruits Lungs: no use of accessory muscles, no dullness to percussion, faint scattered rhonchi Cardiovascular: Rhythm regular, heart sounds  normal, no murmurs or gallops, no peripheral edema Musculoskeletal: No deformities, no cyanosis or clubbing        Assessment & Plan:

## 2021-06-13 NOTE — Assessment & Plan Note (Signed)
We will treat her as asthma exacerbation today. Chest x-ray obtained and reviewed does not show any new infiltrates or effusions,  Will give Solu-Medrol 80 mg IM. Refills on Symbicort provided. She will go home and take an albuterol neb

## 2021-06-13 NOTE — Assessment & Plan Note (Signed)
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  In fact her weight has dropped 20 pounds since her last visit

## 2021-06-14 ENCOUNTER — Encounter (HOSPITAL_BASED_OUTPATIENT_CLINIC_OR_DEPARTMENT_OTHER): Payer: Self-pay

## 2021-06-14 ENCOUNTER — Inpatient Hospital Stay (HOSPITAL_BASED_OUTPATIENT_CLINIC_OR_DEPARTMENT_OTHER)
Admission: EM | Admit: 2021-06-14 | Discharge: 2021-06-16 | DRG: 202 | Disposition: A | Discharge status: Home Health | Payer: Medicare Other | Attending: Internal Medicine | Admitting: Internal Medicine

## 2021-06-14 ENCOUNTER — Emergency Department (HOSPITAL_BASED_OUTPATIENT_CLINIC_OR_DEPARTMENT_OTHER): Payer: Medicare Other

## 2021-06-14 ENCOUNTER — Other Ambulatory Visit: Payer: Self-pay

## 2021-06-14 ENCOUNTER — Telehealth: Payer: Self-pay | Admitting: Pulmonary Disease

## 2021-06-14 DIAGNOSIS — I517 Cardiomegaly: Secondary | ICD-10-CM | POA: Diagnosis not present

## 2021-06-14 DIAGNOSIS — D849 Immunodeficiency, unspecified: Secondary | ICD-10-CM | POA: Diagnosis present

## 2021-06-14 DIAGNOSIS — Z833 Family history of diabetes mellitus: Secondary | ICD-10-CM | POA: Diagnosis not present

## 2021-06-14 DIAGNOSIS — I6930 Unspecified sequelae of cerebral infarction: Secondary | ICD-10-CM

## 2021-06-14 DIAGNOSIS — Z950 Presence of cardiac pacemaker: Secondary | ICD-10-CM | POA: Diagnosis not present

## 2021-06-14 DIAGNOSIS — Z95818 Presence of other cardiac implants and grafts: Secondary | ICD-10-CM

## 2021-06-14 DIAGNOSIS — N179 Acute kidney failure, unspecified: Secondary | ICD-10-CM | POA: Diagnosis present

## 2021-06-14 DIAGNOSIS — J45901 Unspecified asthma with (acute) exacerbation: Secondary | ICD-10-CM | POA: Diagnosis present

## 2021-06-14 DIAGNOSIS — Z96641 Presence of right artificial hip joint: Secondary | ICD-10-CM | POA: Diagnosis present

## 2021-06-14 DIAGNOSIS — Z9049 Acquired absence of other specified parts of digestive tract: Secondary | ICD-10-CM | POA: Diagnosis not present

## 2021-06-14 DIAGNOSIS — I130 Hypertensive heart and chronic kidney disease with heart failure and stage 1 through stage 4 chronic kidney disease, or unspecified chronic kidney disease: Secondary | ICD-10-CM | POA: Diagnosis present

## 2021-06-14 DIAGNOSIS — Z940 Kidney transplant status: Secondary | ICD-10-CM

## 2021-06-14 DIAGNOSIS — R0902 Hypoxemia: Secondary | ICD-10-CM

## 2021-06-14 DIAGNOSIS — Z8249 Family history of ischemic heart disease and other diseases of the circulatory system: Secondary | ICD-10-CM | POA: Diagnosis not present

## 2021-06-14 DIAGNOSIS — Y830 Surgical operation with transplant of whole organ as the cause of abnormal reaction of the patient, or of later complication, without mention of misadventure at the time of the procedure: Secondary | ICD-10-CM | POA: Diagnosis present

## 2021-06-14 DIAGNOSIS — Z888 Allergy status to other drugs, medicaments and biological substances status: Secondary | ICD-10-CM

## 2021-06-14 DIAGNOSIS — Z86718 Personal history of other venous thrombosis and embolism: Secondary | ICD-10-CM

## 2021-06-14 DIAGNOSIS — Z807 Family history of other malignant neoplasms of lymphoid, hematopoietic and related tissues: Secondary | ICD-10-CM | POA: Diagnosis not present

## 2021-06-14 DIAGNOSIS — J4541 Moderate persistent asthma with (acute) exacerbation: Principal | ICD-10-CM | POA: Diagnosis present

## 2021-06-14 DIAGNOSIS — Z7952 Long term (current) use of systemic steroids: Secondary | ICD-10-CM

## 2021-06-14 DIAGNOSIS — J9601 Acute respiratory failure with hypoxia: Secondary | ICD-10-CM | POA: Diagnosis not present

## 2021-06-14 DIAGNOSIS — N184 Chronic kidney disease, stage 4 (severe): Secondary | ICD-10-CM | POA: Diagnosis present

## 2021-06-14 DIAGNOSIS — T8619 Other complication of kidney transplant: Secondary | ICD-10-CM | POA: Diagnosis present

## 2021-06-14 DIAGNOSIS — I5042 Chronic combined systolic (congestive) and diastolic (congestive) heart failure: Secondary | ICD-10-CM | POA: Diagnosis present

## 2021-06-14 DIAGNOSIS — I4819 Other persistent atrial fibrillation: Secondary | ICD-10-CM | POA: Diagnosis present

## 2021-06-14 DIAGNOSIS — K5790 Diverticulosis of intestine, part unspecified, without perforation or abscess without bleeding: Secondary | ICD-10-CM | POA: Diagnosis not present

## 2021-06-14 DIAGNOSIS — Z95828 Presence of other vascular implants and grafts: Secondary | ICD-10-CM | POA: Diagnosis not present

## 2021-06-14 DIAGNOSIS — Z79899 Other long term (current) drug therapy: Secondary | ICD-10-CM

## 2021-06-14 DIAGNOSIS — I5030 Unspecified diastolic (congestive) heart failure: Secondary | ICD-10-CM | POA: Diagnosis not present

## 2021-06-14 DIAGNOSIS — I10 Essential (primary) hypertension: Secondary | ICD-10-CM | POA: Diagnosis present

## 2021-06-14 DIAGNOSIS — Z20822 Contact with and (suspected) exposure to covid-19: Secondary | ICD-10-CM | POA: Diagnosis present

## 2021-06-14 DIAGNOSIS — R0602 Shortness of breath: Secondary | ICD-10-CM | POA: Diagnosis not present

## 2021-06-14 DIAGNOSIS — I739 Peripheral vascular disease, unspecified: Secondary | ICD-10-CM | POA: Diagnosis present

## 2021-06-14 DIAGNOSIS — K269 Duodenal ulcer, unspecified as acute or chronic, without hemorrhage or perforation: Secondary | ICD-10-CM | POA: Diagnosis not present

## 2021-06-14 DIAGNOSIS — J849 Interstitial pulmonary disease, unspecified: Secondary | ICD-10-CM | POA: Diagnosis present

## 2021-06-14 DIAGNOSIS — Z8261 Family history of arthritis: Secondary | ICD-10-CM | POA: Diagnosis not present

## 2021-06-14 DIAGNOSIS — I5043 Acute on chronic combined systolic (congestive) and diastolic (congestive) heart failure: Secondary | ICD-10-CM | POA: Diagnosis present

## 2021-06-14 DIAGNOSIS — F32A Depression, unspecified: Secondary | ICD-10-CM | POA: Diagnosis present

## 2021-06-14 DIAGNOSIS — E7800 Pure hypercholesterolemia, unspecified: Secondary | ICD-10-CM | POA: Diagnosis present

## 2021-06-14 DIAGNOSIS — Z8673 Personal history of transient ischemic attack (TIA), and cerebral infarction without residual deficits: Secondary | ICD-10-CM

## 2021-06-14 DIAGNOSIS — Z7982 Long term (current) use of aspirin: Secondary | ICD-10-CM | POA: Diagnosis not present

## 2021-06-14 DIAGNOSIS — R7989 Other specified abnormal findings of blood chemistry: Secondary | ICD-10-CM | POA: Diagnosis present

## 2021-06-14 LAB — CBC WITH DIFFERENTIAL/PLATELET
Abs Immature Granulocytes: 0.04 10*3/uL (ref 0.00–0.07)
Basophils Absolute: 0.1 10*3/uL (ref 0.0–0.1)
Basophils Relative: 1 %
Eosinophils Absolute: 0 10*3/uL (ref 0.0–0.5)
Eosinophils Relative: 0 %
HCT: 35.4 % — ABNORMAL LOW (ref 36.0–46.0)
Hemoglobin: 10.7 g/dL — ABNORMAL LOW (ref 12.0–15.0)
Immature Granulocytes: 0 %
Lymphocytes Relative: 11 %
Lymphs Abs: 1.2 10*3/uL (ref 0.7–4.0)
MCH: 26.9 pg (ref 26.0–34.0)
MCHC: 30.2 g/dL (ref 30.0–36.0)
MCV: 88.9 fL (ref 80.0–100.0)
Monocytes Absolute: 1.6 10*3/uL — ABNORMAL HIGH (ref 0.1–1.0)
Monocytes Relative: 14 %
Neutro Abs: 8 10*3/uL — ABNORMAL HIGH (ref 1.7–7.7)
Neutrophils Relative %: 74 %
Platelets: 166 10*3/uL (ref 150–400)
RBC: 3.98 MIL/uL (ref 3.87–5.11)
RDW: 19.8 % — ABNORMAL HIGH (ref 11.5–15.5)
WBC: 10.9 10*3/uL — ABNORMAL HIGH (ref 4.0–10.5)
nRBC: 0 % (ref 0.0–0.2)

## 2021-06-14 LAB — BASIC METABOLIC PANEL
Anion gap: 12 (ref 5–15)
BUN: 34 mg/dL — ABNORMAL HIGH (ref 8–23)
CO2: 26 mmol/L (ref 22–32)
Calcium: 9.4 mg/dL (ref 8.9–10.3)
Chloride: 100 mmol/L (ref 98–111)
Creatinine, Ser: 3.62 mg/dL — ABNORMAL HIGH (ref 0.44–1.00)
GFR, Estimated: 13 mL/min — ABNORMAL LOW (ref >60)
Glucose, Bld: 138 mg/dL — ABNORMAL HIGH (ref 70–99)
Potassium: 3.2 mmol/L — ABNORMAL LOW (ref 3.5–5.1)
Sodium: 138 mmol/L (ref 135–145)

## 2021-06-14 LAB — TROPONIN I (HIGH SENSITIVITY)
Troponin I (High Sensitivity): 43 ng/L — ABNORMAL HIGH (ref <18)
Troponin I (High Sensitivity): 46 ng/L — ABNORMAL HIGH (ref <18)

## 2021-06-14 LAB — RESP PANEL BY RT-PCR (FLU A&B, COVID) ARPGX2
Influenza A by PCR: NEGATIVE
Influenza B by PCR: NEGATIVE
SARS Coronavirus 2 by RT PCR: NEGATIVE

## 2021-06-14 LAB — BRAIN NATRIURETIC PEPTIDE: B Natriuretic Peptide: 2060.1 pg/mL — ABNORMAL HIGH (ref 0.0–100.0)

## 2021-06-14 MED ORDER — ALBUTEROL (5 MG/ML) CONTINUOUS INHALATION SOLN
10.0000 mg/h | INHALATION_SOLUTION | Freq: Once | RESPIRATORY_TRACT | Status: AC
  Administered 2021-06-14: 10 mg/h via RESPIRATORY_TRACT
  Filled 2021-06-14: qty 20

## 2021-06-14 MED ORDER — MAGNESIUM SULFATE IN D5W 1-5 GM/100ML-% IV SOLN
1.0000 g | Freq: Once | INTRAVENOUS | Status: AC
  Administered 2021-06-14: 1 g via INTRAVENOUS
  Filled 2021-06-14: qty 100

## 2021-06-14 MED ORDER — ALBUTEROL SULFATE HFA 108 (90 BASE) MCG/ACT IN AERS: 2.0000 | INHALATION_SPRAY | RESPIRATORY_TRACT | Status: DC | PRN

## 2021-06-14 MED ORDER — ALBUTEROL SULFATE (2.5 MG/3ML) 0.083% IN NEBU: 2.5000 mg | INHALATION_SOLUTION | Freq: Once | RESPIRATORY_TRACT | Status: AC

## 2021-06-14 MED ORDER — HEPARIN SODIUM (PORCINE) 5000 UNIT/ML IJ SOLN
5000.0000 [IU] | Freq: Three times a day (TID) | INTRAMUSCULAR | Status: DC
  Administered 2021-06-14 – 2021-06-16 (×4): 5000 [IU] via SUBCUTANEOUS
  Filled 2021-06-14 (×5): qty 1

## 2021-06-14 MED ORDER — METHYLPREDNISOLONE SODIUM SUCC 125 MG IJ SOLR
125.0000 mg | Freq: Once | INTRAMUSCULAR | Status: AC
  Administered 2021-06-14: 125 mg via INTRAVENOUS
  Filled 2021-06-14: qty 2

## 2021-06-14 MED ORDER — MAGNESIUM SULFATE 50 % IJ SOLN
1.0000 g | Freq: Once | INTRAMUSCULAR | Status: DC
  Filled 2021-06-14: qty 2

## 2021-06-14 MED ORDER — ALBUTEROL SULFATE (2.5 MG/3ML) 0.083% IN NEBU
5.0000 mg | INHALATION_SOLUTION | RESPIRATORY_TRACT | Status: DC | PRN
  Administered 2021-06-14 – 2021-06-16 (×4): 5 mg via RESPIRATORY_TRACT
  Filled 2021-06-14 (×4): qty 6

## 2021-06-14 MED ORDER — ALBUTEROL SULFATE (2.5 MG/3ML) 0.083% IN NEBU
INHALATION_SOLUTION | RESPIRATORY_TRACT | Status: AC
  Administered 2021-06-14: 2.5 mg via RESPIRATORY_TRACT
  Filled 2021-06-14: qty 3

## 2021-06-14 NOTE — Telephone Encounter (Signed)
I called the spouse and he reports that the injection should have helped her with her breathing and if she is not better then he would like her to be evaluated. The patient spouse voices understanding and will get her checked out. Nothing further needed.

## 2021-06-14 NOTE — Progress Notes (Signed)
75 year old female significant history of CHF, atrial fibrillation, asthma, interstitial lung disease, prior CVA presenting complaint shortness of breath. She is being admitted for asthma exacerbation.

## 2021-06-14 NOTE — Telephone Encounter (Signed)
I called the PT Angie and she reports that she had went to complete the physical therapy and the patient was struggling to breath so therapy did not get completed. Angie reports that Annice was refusing to be seen in the ER or UC. No other information was given .  the patient declined to come in to be evaluated and so Angie wanted to let us know that she is really struggling to breath but her vitals are stable. The patient was seen in the office yesterday and wants to know what you recommend. Please advise.

## 2021-06-14 NOTE — ED Triage Notes (Addendum)
Pt arrives in wheelchair from home states that she has had weakness, and SOB since Friday. Pt. Reports last breathing treatment this morning, RT at bedside. Strong non productive cough. Pt denies CP at this time but reports some over the past few days.

## 2021-06-14 NOTE — ED Notes (Signed)
Pt provided microwave meal and soda, per her request. EDP Bowie aware.

## 2021-06-14 NOTE — ED Notes (Signed)
Patient found on r/a SpO2 85-90%, no resp distress noted.  10mg /hr Albuterol cont neb started and O2 returned to 2LPM, SpO2 94%

## 2021-06-14 NOTE — ED Notes (Signed)
To room 5 for [facility],

## 2021-06-14 NOTE — ED Provider Notes (Signed)
[facility] EMERGENCY DEPARTMENT Provider Note   CSN: [id number] Arrival date & time: 06/14/21  1208     History Chief Complaint  Patient presents with   Shortness of Breath    [name] is a 75 y.o. female.  The history is provided by the patient and medical records. No language interpreter was used.  Shortness of Breath  75 year old female significant history of CHF, atrial fibrillation, asthma, interstitial lung disease, prior CVA presenting complaint shortness of breath.  For the past 3 days patient has had progressive worsening shortness of breath and wheezing.  She also endorsed having increased nonproductive cough, congestion and pain in her chest with breathing.  5 days ago she experienced some mild lightheadedness but none since.  Symptoms appear to be worse with exertion.  She also endorsed some decrease in appetite.  She denies fever chills diaphoresis headache jaw pain hemoptysis abdominal pain or new leg swelling.  She denies any recent sick contact.  She is not on home oxygen.  She was seen by her pulmonologist yesterday for her complaint and was diagnosed with asthma exacerbation.  She was given steroid injection and nebulizer to use at home.  She report no improvement of her symptoms at this time.   Past Medical History:  Diagnosis Date   Acute diastolic (congestive) heart failure (HCC) 08/12/2017   Acute GI bleeding 11/14/2019   Acute kidney injury superimposed on chronic kidney disease ([place]) 11/17/2017   AKI (acute kidney injury) ([place]) 11/04/2017   Anemia    Anxiety    ARF (acute renal failure) ([place]) 06/06/2011   Arthritis    "shoulders; arms; hips" (02/04/2018)   Asthma    Atrial fibrillation (HCC)    Atrial flutter ([place])    s/p aflutter ablation at [facility]   Bacteriuria, asymptomatic 11/14/2020   Benign hypertension with ESRD (end-stage renal disease) ([place])    Blood transfusion    never had a reaction to blood transfusion   Cardiomyopathy Mar 2013    Mild, EF 50-55% by Mar 2013 ECHO, diast dysfxn II   Cardiomyopathy ([place])    CHF (congestive heart failure) ([place]) 2005   CHF (congestive heart failure) ([place])    Childhood asthma    Closed fracture of right distal radius 10/18/2016   CMV (cytomegalovirus infection) ([place]) 10/03/2015   Constipation    takes Miralax daily   Constipation    Depression    takes Zoloft daily   Distal radius fracture, right    DVT of lower extremity, bilateral ([place]) 12/21/11   "they're there now; been there for 2 wks"   Eczema    End stage renal disease ([place]) 11/06/2011   ESRD (end stage renal disease) on dialysis [facility]) 09/2011   07-19-2015 had Kidney transplant at [facility]; "don't get dialysis anymore" (02/04/2018)   Essential hypertension 07/07/2007   Qualifier: Diagnosis of  By: Garen Grams     Fractures, stress    in both feet--6 OR 7 YRS AGO--RESOLVED   Generalized edema [id number]   GI bleed 11/14/2019   Gout    doesn't require meds    HCAP (healthcare-associated pneumonia) 09/10/2011   Hearing difficulty    Hematoma of left lower leg 05/17/2020   High cholesterol    History of CVA (cerebrovascular accident) without residual deficits    History of hip replacement, total, right    History of pneumonia    Hx of Clostridium difficile infection    Hypercalcemia    09/10/11   Hyperparathyroidism ([place])  04/07/2013   Hypertension    takes Diltiazem daily    Hypomagnesemia 08/02/2015   Hypophosphatemia 11/08/2017   ICB (intracranial bleed) ([place])    Immunosuppression ([place]) 07/25/2015   Memory changes    Morbid obesity ([place])    Nonischemic dilated cardiomyopathy ([place])    Obesity 04/07/2013   Oligouria    Oral mucositis 02/22/2018   OSA on CPAP    PAF (paroxysmal atrial fibrillation) (HCC)     HX OF CEREBRAL BLEED WHILE ON COUMADIN-SO PT NOT ON ANY BLOOD THINNERS NOW   Peripheral vascular disease ([place])    Persistent atrial fibrillation ([place]) 08/12/2017   Overview:  Added automatically from request for  surgery [phone]  Formatting of this note might be different from the original. Added automatically from request for surgery [id number]   Presence of Watchman left atrial appendage closure device 10/04/2017   Proteinuria 09/04/2016   Psoriasis 08/07/2016   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/25/2015   S/P insertion of IVC (inferior vena caval) filter    Sepsis ([place]) 11/08/2017   Stress fracture    bilateral feet   Stroke ([place]) 2009   denies residual on 02/04/2018;  hemorrhagic now off coumadin   Stroke ([place]) 07/17/2007   denies residual, hemorrhagic now off coumadin   Stroke due to intracerebral hemorrhage ([place]) 2009   Suture reaction 09/04/2016   Transfusion history    Use of cane as ambulatory aid     Patient Active Problem List   Diagnosis Date Noted   Duodenal ulcer with hemorrhage 03/29/2021   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   Gastroesophageal reflux disease 06/29/2020   Iron deficiency anemia 06/29/2020   SOB (shortness of breath) 03/11/2020   Pneumonia due to COVID-19 virus 03/11/2020   Traumatic seroma of lower leg, right, subsequent encounter 01/28/2020   Other secondary pulmonary hypertension ([place]) 12/03/2019   Medication management 11/04/2019   Acute on chronic combined systolic and diastolic congestive heart failure ([place]) 10/22/2019   S/P AV nodal ablation 10/08/2018   Cardiac pacemaker in situ 04/18/2018   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   Personal history of PE (pulmonary embolism) 78/46/9629   Chronic systolic heart failure ([place]) 02/11/2018   Hyperkalemia 02/03/2018   Lower extremity edema 12/10/2017   Presence of Watchman left atrial appendage closure device 10/04/2017   Chronic respiratory failure with hypoxia ([place]) 10/03/2017   Dyspnea 07/26/2017   Atrial flutter ([place])    Peripheral vascular disease ([place])    Oligouria    Hypertension    Fractures, stress    Eczema    Depression    Constipation    Arthritis    Exacerbation of asthma 04/01/2017    Closed fracture of right distal radius 10/18/2016   Proteinuria 09/04/2016   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/25/2015   Immunosuppression ([place]) 07/25/2015   Nonischemic dilated cardiomyopathy (HCC)    S/P insertion of IVC (inferior vena caval) filter 04/07/2013   Use of cane as ambulatory aid 04/07/2013   S/P hip replacement 03/25/2012   DVT of lower extremity, bilateral ([place]) 12/21/2011   Personal history of DVT (deep vein thrombosis) 12/08/2011   Obstructive sleep apnea 12/08/2011   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[place]) 10/24/2011   Pseudomonas aeruginosa infection 10/24/2011   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 ([place]) 09/14/2011   Hypercalcemia    Gout 09/10/2011   Absence of right hip joint with antibiotic pacer 09/03/2011   Traumatic seroma of thigh ([place]) 06/11/2011   Clostridium difficile colitis 06/09/2011  Leukocytosis 06/08/2011   Symptomatic anemia 06/06/2011   SINUSITIS- ACUTE-NOS 09/27/2010   PAF (paroxysmal atrial fibrillation) ([place]) 11/25/2008   CVA 09/28/2008   History of CVA with residual deficit 09/28/2008   Hemiplegia ([place]) 09/21/2008   Psoriasis 10/20/2007   Asthmatic bronchitis without complication 64/40/3474    Past Surgical History:  Procedure Laterality Date   AV FISTULA PLACEMENT  09/28/2011   Procedure: ARTERIOVENOUS (AV) FISTULA CREATION;  Surgeon: Rosetta Posner, MD;  Location: [facility] OR;  Service: Vascular;  Laterality: Left;   [place] DEFECT   CHOLECYSTECTOMY     1980's   COLONOSCOPY     COLONOSCOPY WITH PROPOFOL N/A 11/16/2019   Procedure: COLONOSCOPY WITH PROPOFOL;  Surgeon: Juanita Craver, MD;  Location: [facility] ENDOSCOPY;  Service: Endoscopy;  Laterality: N/A;   CYSTOSCOPY     many yrs ago   ESOPHAGOGASTRODUODENOSCOPY (EGD) WITH PROPOFOL N/A 11/16/2019   Procedure: ESOPHAGOGASTRODUODENOSCOPY (EGD) WITH PROPOFOL;  Surgeon: Juanita Craver, MD;  Location: [facility] ENDOSCOPY;   Service: Endoscopy;  Laterality: N/A;   ESOPHAGOGASTRODUODENOSCOPY (EGD) WITH PROPOFOL N/A 03/20/2021   Procedure: ESOPHAGOGASTRODUODENOSCOPY (EGD) WITH PROPOFOL;  Surgeon: Milus Banister, MD;  Location: [facility] ENDOSCOPY;  Service: Endoscopy;  Laterality: N/A;   FEMUR FRACTURE SURGERY Right 03/2011; 09/03/2011   "had 2, 2 wk apart in 2012; broke it again 08/2011 & had OR"   FRACTURE SURGERY     HEMOSTASIS CONTROL  03/20/2021   Procedure: HEMOSTASIS CONTROL;  Surgeon: Milus Banister, MD;  Location: [facility] ENDOSCOPY;  Service: Endoscopy;;   HOT HEMOSTASIS N/A 03/20/2021   Procedure: HOT HEMOSTASIS (ARGON PLASMA COAGULATION/BICAP);  Surgeon: Milus Banister, MD;  Location: [facility] ENDOSCOPY;  Service: Endoscopy;  Laterality: N/A;   I & D EXTREMITY  09/15/2011   Procedure: IRRIGATION AND DEBRIDEMENT EXTREMITY w REMOVAL OF HARDWARE;  Surgeon: [name], MD;  Location: [facility];  Service: Orthopedics;  Laterality: Right;   INCISION AND DRAINAGE HIP  12/21/2011   Procedure: IRRIGATION AND DEBRIDEMENT HIP;  Surgeon: [name], MD;  Location: [facility];  Service: Orthopedics;  Laterality: Right;  I&D RIGHT HIP WITH PLACEMENT ANTIBIOTIC SPACER   INSERTION OF DIALYSIS CATHETER     Procedure: INSERTION OF DIALYSIS CATHETER;  Surgeon: Rosetta Posner, MD;  Location: [facility];  Service: Vascular;  Laterality: Right;   IRRIGATION AND DEBRIDEMENT ABSCESS  12/21/11   right hip   JOINT REPLACEMENT     KIDNEY TRANSPLANT  07/19/15   LAPAROSCOPIC GASTRIC BANDING  2008   LEFT ATRIAL APPENDAGE OCCLUSION  10/04/2017   OPEN REDUCTION INTERNAL FIXATION (ORIF) DISTAL RADIAL FRACTURE Right 10/18/2016   Procedure: OPEN REDUCTION INTERNAL FIXATION (ORIF) RIGHT DISTAL RADIAL FRACTURE;  Surgeon: Marchia Bond, MD;  Location: [facility];  Service: Orthopedics;  Laterality: Right;   TOTAL HIP ARTHROPLASTY Right 02/2011   right THA 02/2011, I&D/removal of hardware 09/2011,, repeat I&D Jun 2013, reimplantation R THA 03-26-2012   TOTAL HIP  REVISION  03/25/2012   Procedure: TOTAL HIP REVISION;  Surgeon: [name], MD;  Location: [facility] ORS;  Service: Orthopedics;  Laterality: Right;  Right Total Hip Reimplantation   TUBAL LIGATION     VENA CAVA FILTER PLACEMENT  11/2011     OB History   No obstetric history on file.     Family History  Problem Relation Age of Onset   Cancer Father    Diabetes Mother  Hypertension Mother    Arthritis Mother    Hodgkin's lymphoma Other 69       dscd---HODGKINS DISEASE   Hypertension Brother     Social History   Tobacco Use   Smoking status: Never   Smokeless tobacco: Never  Vaping Use   Vaping Use: Never used  Substance Use Topics   Alcohol use: Never   Drug use: Never    Home Medications Prior to Admission medications   Medication Sig Start Date End Date Taking? Authorizing Provider  acetaminophen (TYLENOL) 500 MG tablet Take 500 mg by mouth every 6 (six) hours as needed for mild pain or headache.     [provider]  albuterol (PROVENTIL) (2.5 MG/3ML) 0.083% nebulizer solution Take 3 mLs (2.5 mg total) by nebulization every 6 (six) hours as needed for wheezing or shortness of breath. Dx:J45.909 10/25/19   Rai, Vernelle Emerald, MD  aspirin EC 81 MG tablet Take 81 mg by mouth daily. Swallow whole.    [provider]  budesonide-formoterol (SYMBICORT) 160-4.5 MCG/ACT inhaler Inhale 2 puffs into the lungs 2 (two) times daily. 06/13/21   [name], MD  calcitRIOL (ROCALTROL) 0.25 MCG capsule Take 0.25 mcg by mouth daily. Patient not taking: Reported on 06/13/2021 12/13/19   [provider]  carvedilol (COREG) 12.5 MG tablet Take 2 tablets (25 mg total) by mouth 2 (two) times daily. 11/18/19   Aline August, MD  cefpodoxime (VANTIN) 100 MG tablet Take 100 mg by mouth daily. 05/09/21   [provider]  docusate sodium (COLACE) 100 MG capsule Take 100 mg by mouth daily.    [provider]  DULoxetine (CYMBALTA) 30 MG capsule TAKE 1 CAPSULE  BY MOUTH EVERY DAY Patient taking differently: Take 30 mg by mouth daily. 11/03/20   [name], [name], NP  Ferrous Sulfate (IRON PO) Take 325 mg by mouth daily.     [provider]  furosemide (LASIX) 80 MG tablet Take 120 mg by mouth daily.    [provider]  mycophenolate (MYFORTIC) 180 MG EC tablet Take 180 mg by mouth 2 (two) times daily.    [provider]  ondansetron (ZOFRAN) 4 MG tablet Take 1 tablet (4 mg total) by mouth every 8 (eight) hours as needed for up to 9 doses for nausea. 03/25/21   [name], MD  pantoprazole (PROTONIX) 40 MG tablet Take 1 tablet (40 mg total) by mouth 2 (two) times daily. 03/25/21 05/24/21  [name], MD  predniSONE (DELTASONE) 5 MG tablet Take 5 mg by mouth daily. 02/21/21   [provider]  tacrolimus (PROGRAF) 1 MG capsule Take 1-2 mg by mouth See admin instructions. 2 mg in the morning 1 mg at bedtime    [provider]  VENTOLIN HFA 108 (90 Base) MCG/ACT inhaler Inhale 2 puffs into the lungs 2 (two) times daily as needed. 10/21/19   [provider]    Allergies    Warfarin, Warfarin sodium, Ace inhibitors, Amiodarone, and Amlodipine  Review of Systems   Review of Systems  Respiratory:  Positive for shortness of breath.   All other systems reviewed and are negative.  Physical Exam Updated Vital Signs BP (!) 154/122   Pulse 72   Temp 98.1 F (36.7 C) (Oral)   Ht 5\' 6"  (1.676 m)   Wt 56.7 kg   SpO2 100%   BMI 20.18 kg/m   Physical Exam Vitals and nursing note reviewed.  Constitutional:  General: She is not in acute distress.    Appearance: She is well-developed.  HENT:     Head: Atraumatic.  Eyes:     Conjunctiva/sclera: Conjunctivae normal.  Neck:     Vascular: No JVD.  Cardiovascular:     Rate and Rhythm: Normal rate. Rhythm irregular.  Pulmonary:     Effort: No tachypnea.     Breath sounds: Decreased breath sounds and wheezing present. No rhonchi  or rales.  Chest:     Chest wall: No tenderness.  Musculoskeletal:     Cervical back: Neck supple.     Right lower leg: No edema.     Left lower leg: No edema.  Skin:    Findings: No rash.  Neurological:     Mental Status: She is alert and oriented to person, place, and time.  Psychiatric:        Mood and Affect: Mood normal.    ED Results / Procedures / Treatments   Labs (all labs ordered are listed, but only abnormal results are displayed) Labs Reviewed  BASIC METABOLIC PANEL - Abnormal; Notable for the following components:      Result Value   Potassium 3.2 (*)    Glucose, Bld 138 (*)    BUN 34 (*)    Creatinine, Ser 3.62 (*)    GFR, Estimated 13 (*)    All other components within normal limits  CBC WITH DIFFERENTIAL/PLATELET - Abnormal; Notable for the following components:   WBC 10.9 (*)    Hemoglobin 10.7 (*)    HCT 35.4 (*)    RDW 19.8 (*)    Neutro Abs 8.0 (*)    Monocytes Absolute 1.6 (*)    All other components within normal limits  BRAIN NATRIURETIC PEPTIDE - Abnormal; Notable for the following components:   B Natriuretic Peptide 2,060.1 (*)    All other components within normal limits  TROPONIN I (HIGH SENSITIVITY) - Abnormal; Notable for the following components:   Troponin I (High Sensitivity) 43 (*)    All other components within normal limits  TROPONIN I (HIGH SENSITIVITY) - Abnormal; Notable for the following components:   Troponin I (High Sensitivity) 46 (*)    All other components within normal limits  RESP PANEL BY RT-PCR (FLU A&B, COVID) [id number]    EKG None ED ECG REPORT   Date: 06/14/2021  Rate: 70  Rhythm: atrial fibrillation  QRS Axis: left  Intervals: QT prolonged  ST/T Wave abnormalities: nonspecific ST/T changes  Conduction Disutrbances:nonspecific intraventricular conduction delay  Narrative Interpretation:   Old EKG Reviewed: unchanged  I have personally reviewed the EKG tracing and agree with the computerized printout as  noted.   Radiology DG Chest 2 View  Result Date: 06/14/2021 CLINICAL DATA:  Shortness of breath. EXAM: CHEST - 2 VIEW COMPARISON:  June 13, 2021. FINDINGS: Stable cardiomegaly. Single lead left-sided pacemaker is unchanged in position. Both lungs are clear. The visualized skeletal structures are unremarkable. IMPRESSION: No active cardiopulmonary disease. Electronically Signed   By: Marijo Conception M.D.   On: 06/14/2021 13:19    Procedures .Critical Care Performed by: Domenic Moras, PA-C Authorized by: Domenic Moras, PA-C   Critical care provider statement:    Critical care time (minutes):  30   Critical care was necessary to treat or prevent imminent or life-threatening deterioration of the following conditions:  Respiratory failure   Critical care was time spent personally by me on the following activities:  Development of treatment plan with patient or  surrogate, discussions with consultants, evaluation of patient's response to treatment, examination of patient, ordering and review of laboratory studies, ordering and review of radiographic studies, ordering and performing treatments and interventions, pulse oximetry, re-evaluation of patient's condition and review of old charts   Medications Ordered in ED Medications  magnesium sulfate IVPB 1 g 100 mL (1 g Intravenous New Bag/Given 06/14/21 1517)  albuterol (PROVENTIL) (2.5 MG/3ML) 0.083% nebulizer solution 2.5 mg (2.5 mg Nebulization Given 06/14/21 1233)  albuterol (PROVENTIL,VENTOLIN) solution continuous neb (10 mg/hr Nebulization Given 06/14/21 1425)    ED Course  I have reviewed the triage vital signs and the nursing notes.  Pertinent labs & imaging results that were available during my care of the patient were reviewed by me and considered in my medical decision making (see chart for details).    MDM Rules/Calculators/A&P                           BP (!) 163/93   Pulse 71   Temp 98.1 F (36.7 C) (Oral)   Resp (!) 23    Ht 5\' 6"  (1.676 m)   Wt 56.7 kg   SpO2 100%   BMI 20.18 kg/m   Final Clinical Impression(s) / ED Diagnoses Final diagnoses:  Moderate persistent asthma with exacerbation  Hypoxia    Rx / DC Orders ED Discharge Orders     None      1:59 PM Patient here with progressive shortness of breath and wheezes for the past 3 days.  Was seen by pulmonologist yesterday and diagnosed with asthma exacerbation discharged home with symptom control however symptoms still persist.  On exam, patient has decreased lung sounds with expiratory wheezes.  She is afebrile, no other cold symptoms. Care discussed with Dr. Almyra Free.   4:14 PM Chest x-ray obtained today without any acute finding.Viral respiratory panel negative.  Patient does have mildly elevated troponin of 43, and 46 respectively.  This is likely in the setting of poor renal function as her BUN is 34 and creatinine is 3.62 similar to her baseline.  She also has elevated BNP of 2060.  She does not appear to be fluid overload and her chest x-ray is cleared.  However, her O2 sats is at 86% on room air requiring 2 L of supplemental oxygen.  Some improvement with repeat breathing treatment and magnesium.  At this time will consult for admission for asthma exacerbation.  4:28 PM Appreciate consultation from [facility] Dr. Karleen Hampshire who agrees to admit pt for further management of acute respiratory failure 2/2 asthma exacerbation.    Domenic Moras, PA-C 06/14/21 1629    Luna Fuse, MD 06/21/21 [phone]

## 2021-06-14 NOTE — ED Notes (Signed)
Attempted report x1, receiving RN unavailable to take report at this time. Will try again shortly.

## 2021-06-14 NOTE — ED Notes (Signed)
Pt and pts husband provided update as to pts assigned bed status.  All questions answered. No further needs expressed, will continue to monitor.

## 2021-06-15 ENCOUNTER — Encounter (HOSPITAL_COMMUNITY): Payer: Self-pay | Admitting: Internal Medicine

## 2021-06-15 DIAGNOSIS — I5042 Chronic combined systolic (congestive) and diastolic (congestive) heart failure: Secondary | ICD-10-CM | POA: Diagnosis present

## 2021-06-15 DIAGNOSIS — N179 Acute kidney failure, unspecified: Secondary | ICD-10-CM | POA: Diagnosis present

## 2021-06-15 DIAGNOSIS — J45901 Unspecified asthma with (acute) exacerbation: Secondary | ICD-10-CM | POA: Diagnosis present

## 2021-06-15 DIAGNOSIS — I5043 Acute on chronic combined systolic (congestive) and diastolic (congestive) heart failure: Secondary | ICD-10-CM | POA: Diagnosis present

## 2021-06-15 LAB — BASIC METABOLIC PANEL
Anion gap: 11 (ref 5–15)
BUN: 38 mg/dL — ABNORMAL HIGH (ref 8–23)
CO2: 23 mmol/L (ref 22–32)
Calcium: 8.9 mg/dL (ref 8.9–10.3)
Chloride: 101 mmol/L (ref 98–111)
Creatinine, Ser: 3.21 mg/dL — ABNORMAL HIGH (ref 0.44–1.00)
GFR, Estimated: 15 mL/min — ABNORMAL LOW (ref >60)
Glucose, Bld: 133 mg/dL — ABNORMAL HIGH (ref 70–99)
Potassium: 3.5 mmol/L (ref 3.5–5.1)
Sodium: 135 mmol/L (ref 135–145)

## 2021-06-15 MED ORDER — LACTATED RINGERS IV BOLUS
500.0000 mL | Freq: Once | INTRAVENOUS | Status: AC
  Administered 2021-06-15: 500 mL via INTRAVENOUS

## 2021-06-15 MED ORDER — PREDNISONE 50 MG PO TABS
50.0000 mg | ORAL_TABLET | Freq: Every day | ORAL | Status: DC
  Administered 2021-06-15: 50 mg via ORAL
  Filled 2021-06-15: qty 1

## 2021-06-15 MED ORDER — ASPIRIN EC 81 MG PO TBEC
81.0000 mg | DELAYED_RELEASE_TABLET | Freq: Every day | ORAL | Status: DC
  Administered 2021-06-15 – 2021-06-16 (×2): 81 mg via ORAL
  Filled 2021-06-15 (×2): qty 1

## 2021-06-15 MED ORDER — IPRATROPIUM-ALBUTEROL 0.5-2.5 (3) MG/3ML IN SOLN
3.0000 mL | Freq: Four times a day (QID) | RESPIRATORY_TRACT | Status: DC
  Administered 2021-06-15 – 2021-06-16 (×2): 3 mL via RESPIRATORY_TRACT
  Filled 2021-06-15 (×2): qty 3

## 2021-06-15 MED ORDER — MYCOPHENOLATE SODIUM 180 MG PO TBEC
180.0000 mg | DELAYED_RELEASE_TABLET | Freq: Two times a day (BID) | ORAL | Status: DC
  Administered 2021-06-15 – 2021-06-16 (×4): 180 mg via ORAL
  Filled 2021-06-15 (×6): qty 1

## 2021-06-15 MED ORDER — TACROLIMUS 1 MG PO CAPS
2.0000 mg | ORAL_CAPSULE | Freq: Every day | ORAL | Status: DC
  Administered 2021-06-15 – 2021-06-16 (×2): 2 mg via ORAL
  Filled 2021-06-15 (×2): qty 2

## 2021-06-15 MED ORDER — MOMETASONE FURO-FORMOTEROL FUM 200-5 MCG/ACT IN AERO
2.0000 | INHALATION_SPRAY | Freq: Two times a day (BID) | RESPIRATORY_TRACT | Status: DC
  Administered 2021-06-15 – 2021-06-16 (×3): 2 via RESPIRATORY_TRACT
  Filled 2021-06-15: qty 8.8

## 2021-06-15 MED ORDER — TACROLIMUS 1 MG PO CAPS
1.0000 mg | ORAL_CAPSULE | Freq: Every day | ORAL | Status: DC
  Administered 2021-06-15 (×2): 1 mg via ORAL
  Filled 2021-06-15 (×2): qty 1

## 2021-06-15 NOTE — H&P (Signed)
History and Physical    ARNETTE DRIGGS PNT:[id number] DOB: [birth year] DOA: 06/14/2021  PCP: [name], NP  Patient coming from: [facility] ED  I have personally briefly reviewed patient's old medical records in [place]  Chief Complaint: Increasing shortness of breath  HPI: [name] is a 75 y.o. 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  Symptoms started about 4 days ago when she had increasing shortness of breath with exertion.  Had productive cough.  Denies any fever.  No chest pain or lower extremity edema.  Reports asthma being fairly controlled at baseline.  ED Course: She was afebrile, mildly hypertensive with BP of 167/89 and placed on 2 L via nasal cannula. WBC of 10.9, K of 10.7, sodium of 138, K of 3.2, creatinine elevated at 3.62 from prior of 2.25 Negative for flu and COVID PCR BNP of greater than 2000 with troponin of 43 --> 46.  Chest x-ray was negative.  EKG unchanged from prior.  Review of Systems: Pertinent positives and negatives as above.  Unable to obtain for ROS since patient was drowsy and repeatedly fell asleep during evaluation  Past Medical History:  Diagnosis Date   Acute diastolic (congestive) heart failure ([place]) 08/12/2017   Acute GI bleeding 11/14/2019   Acute kidney injury superimposed on chronic kidney disease ([place]) 11/17/2017   AKI (acute kidney injury) ([place]) 11/04/2017   Anemia    Anxiety    ARF (acute renal failure) ([place]) 06/06/2011   Arthritis    "shoulders; arms; hips" (02/04/2018)   Asthma    Atrial fibrillation (HCC)    Atrial flutter ([place])    s/p aflutter ablation at [facility]   Bacteriuria, asymptomatic 11/14/2020   Benign hypertension with ESRD (end-stage renal  disease) ([place])    Blood transfusion    never had a reaction to blood transfusion   Cardiomyopathy Mar 2013   Mild, EF 50-55% by Mar 2013 ECHO, diast dysfxn II   Cardiomyopathy ([place])    CHF (congestive heart failure) ([place]) 2005   CHF (congestive heart failure) ([place])    Childhood asthma    Closed fracture of right distal radius 10/18/2016   CMV (cytomegalovirus infection) ([place]) 10/03/2015   Constipation    takes Miralax daily   Constipation    Depression    takes Zoloft daily   Distal radius fracture, right    DVT of lower extremity, bilateral ([place]) 12/21/11   "they're there now; been there for 2 wks"   Eczema    End stage renal disease ([place]) 11/06/2011   ESRD (end stage renal disease) on dialysis [facility]) 09/2011   07-19-2015 had Kidney transplant at [facility]; "don't get dialysis anymore" (02/04/2018)   Essential hypertension 07/07/2007   Qualifier: Diagnosis of  By: Garen Grams     Fractures, stress    in both feet--6 OR 7 YRS AGO--RESOLVED   Generalized edema [id number]   GI bleed 11/14/2019   Gout    doesn't require meds    HCAP (healthcare-associated pneumonia) 09/10/2011   Hearing difficulty    Hematoma of left lower leg 05/17/2020   High cholesterol    History of CVA (cerebrovascular accident) without residual deficits    History of hip replacement, total, right    History of pneumonia  Hx of Clostridium difficile infection    Hypercalcemia    09/10/11   Hyperparathyroidism ([place]) 04/07/2013   Hypertension    takes Diltiazem daily    Hypomagnesemia 08/02/2015   Hypophosphatemia 11/08/2017   ICB (intracranial bleed) ([place])    Immunosuppression ([place]) 07/25/2015   Memory changes    Morbid obesity ([place])    Nonischemic dilated cardiomyopathy ([place])    Obesity 04/07/2013   Oligouria    Oral mucositis 02/22/2018   OSA on CPAP    PAF (paroxysmal atrial fibrillation) (HCC)     HX OF CEREBRAL BLEED WHILE ON COUMADIN-SO PT NOT ON ANY BLOOD THINNERS NOW   Peripheral vascular disease  ([place])    Persistent atrial fibrillation ([place]) 08/12/2017   Overview:  Added automatically from request for surgery [phone]  Formatting of this note might be different from the original. Added automatically from request for surgery [id number]   Presence of Watchman left atrial appendage closure device 10/04/2017   Proteinuria 09/04/2016   Psoriasis 08/07/2016   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/25/2015   S/P insertion of IVC (inferior vena caval) filter    Sepsis ([place]) 11/08/2017   Stress fracture    bilateral feet   Stroke ([place]) 2009   denies residual on 02/04/2018;  hemorrhagic now off coumadin   Stroke ([place]) 07/17/2007   denies residual, hemorrhagic now off coumadin   Stroke due to intracerebral hemorrhage ([place]) 2009   Suture reaction 09/04/2016   Transfusion history    Use of cane as ambulatory aid     Past Surgical History:  Procedure Laterality Date   AV FISTULA PLACEMENT  09/28/2011   Procedure: ARTERIOVENOUS (AV) FISTULA CREATION;  Surgeon: Rosetta Posner, MD;  Location: [facility] OR;  Service: Vascular;  Laterality: Left;   [place] DEFECT   CHOLECYSTECTOMY     1980's   COLONOSCOPY     COLONOSCOPY WITH PROPOFOL N/A 11/16/2019   Procedure: COLONOSCOPY WITH PROPOFOL;  Surgeon: Juanita Craver, MD;  Location: [facility] ENDOSCOPY;  Service: Endoscopy;  Laterality: N/A;   CYSTOSCOPY     many yrs ago   ESOPHAGOGASTRODUODENOSCOPY (EGD) WITH PROPOFOL N/A 11/16/2019   Procedure: ESOPHAGOGASTRODUODENOSCOPY (EGD) WITH PROPOFOL;  Surgeon: Juanita Craver, MD;  Location: [facility] ENDOSCOPY;  Service: Endoscopy;  Laterality: N/A;   ESOPHAGOGASTRODUODENOSCOPY (EGD) WITH PROPOFOL N/A 03/20/2021   Procedure: ESOPHAGOGASTRODUODENOSCOPY (EGD) WITH PROPOFOL;  Surgeon: Milus Banister, MD;  Location[facility] ENDOSCOPY;  Service: Endoscopy;  Laterality: N/A;   FEMUR FRACTURE SURGERY Right 03/2011; 09/03/2011   "had 2, 2 wk apart in 2012; broke it again 08/2011 & had OR"    FRACTURE SURGERY     HEMOSTASIS CONTROL  03/20/2021   Procedure: HEMOSTASIS CONTROL;  Surgeon: Milus Banister, MD;  Location: [facility] ENDOSCOPY;  Service: Endoscopy;;   HOT HEMOSTASIS N/A 03/20/2021   Procedure: HOT HEMOSTASIS (ARGON PLASMA COAGULATION/BICAP);  Surgeon: Milus Banister, MD;  Location[facility] ENDOSCOPY;  Service: Endoscopy;  Laterality: N/A;   I & D EXTREMITY  09/15/2011   Procedure: IRRIGATION AND DEBRIDEMENT EXTREMITY w REMOVAL OF HARDWARE;  Surgeon: [name], MD;  Location: [place];  Service: Orthopedics;  Laterality: Right;   INCISION AND DRAINAGE HIP  12/21/2011   Procedure: IRRIGATION AND DEBRIDEMENT HIP;  Surgeon: [name], MD;  Location: [place];  Service: Orthopedics;  Laterality: Right;  I&D RIGHT HIP WITH PLACEMENT ANTIBIOTIC SPACER   INSERTION OF DIALYSIS CATHETER  Procedure: INSERTION OF DIALYSIS CATHETER;  Surgeon: Rosetta Posner, MD;  Location: [place];  Service: Vascular;  Laterality: Right;   IRRIGATION AND DEBRIDEMENT ABSCESS  12/21/11   right hip   JOINT REPLACEMENT     KIDNEY TRANSPLANT  07/19/15   LAPAROSCOPIC GASTRIC BANDING  2008   LEFT ATRIAL APPENDAGE OCCLUSION  10/04/2017   OPEN REDUCTION INTERNAL FIXATION (ORIF) DISTAL RADIAL FRACTURE Right 10/18/2016   Procedure: OPEN REDUCTION INTERNAL FIXATION (ORIF) RIGHT DISTAL RADIAL FRACTURE;  Surgeon: Marchia Bond, MD;  Location: [place];  Service: Orthopedics;  Laterality: Right;   TOTAL HIP ARTHROPLASTY Right 02/2011   right THA 02/2011, I&D/removal of hardware 09/2011,, repeat I&D Jun 2013, reimplantation R THA 03-26-2012   TOTAL HIP REVISION  03/25/2012   Procedure: TOTAL HIP REVISION;  Surgeon: [name], MD;  Location: [facility] ORS;  Service: Orthopedics;  Laterality: Right;  Right Total Hip Reimplantation   TUBAL LIGATION     VENA CAVA FILTER PLACEMENT  11/2011     reports that she has never smoked. She has never used smokeless tobacco. She reports that she does not drink alcohol and does not use  drugs. Social History  Allergies  Allergen Reactions   Warfarin Other (See Comments)    Left side brain hemorrhage   Warfarin Sodium Other (See Comments)    Caused her to have a stroke   Ace Inhibitors Other (See Comments)    Reaction unknown   Amiodarone Other (See Comments)    Pt with Restrictive Lung Disease by 10/2017 PFT with decreased DLCO   Amlodipine Other (See Comments)    Family History  Problem Relation Age of Onset   Cancer Father    Diabetes Mother    Hypertension Mother    Arthritis Mother    Hodgkin's lymphoma Other 41       dscd---HODGKINS DISEASE   Hypertension Brother      Prior to Admission medications   Medication Sig Start Date End Date Taking? Authorizing Provider  acetaminophen (TYLENOL) 500 MG tablet Take 500 mg by mouth every 6 (six) hours as needed for mild pain or headache.     [provider]  albuterol (PROVENTIL) (2.5 MG/3ML) 0.083% nebulizer solution Take 3 mLs (2.5 mg total) by nebulization every 6 (six) hours as needed for wheezing or shortness of breath. Dx:J45.909 10/25/19   Rai, Vernelle Emerald, MD  aspirin EC 81 MG tablet Take 81 mg by mouth daily. Swallow whole.    [provider]  budesonide-formoterol (SYMBICORT) 160-4.5 MCG/ACT inhaler Inhale 2 puffs into the lungs 2 (two) times daily. 06/13/21   [name], MD  calcitRIOL (ROCALTROL) 0.25 MCG capsule Take 0.25 mcg by mouth daily. Patient not taking: Reported on 06/13/2021 12/13/19   [provider]  carvedilol (COREG) 12.5 MG tablet Take 2 tablets (25 mg total) by mouth 2 (two) times daily. 11/18/19   Aline August, MD  cefpodoxime (VANTIN) 100 MG tablet Take 100 mg by mouth daily. 05/09/21   [provider]  docusate sodium (COLACE) 100 MG capsule Take 100 mg by mouth daily.    [provider]  DULoxetine (CYMBALTA) 30 MG capsule TAKE 1 CAPSULE BY MOUTH EVERY DAY Patient taking differently: Take 30 mg by mouth daily. 11/03/20   [name], [name], NP  Ferrous Sulfate (IRON PO) Take 325 mg by mouth daily.     [provider]  furosemide (LASIX) 80 MG tablet Take 120 mg by mouth daily.  [provider]  mycophenolate (MYFORTIC) 180 MG EC tablet Take 180 mg by mouth 2 (two) times daily.    [provider]  ondansetron (ZOFRAN) 4 MG tablet Take 1 tablet (4 mg total) by mouth every 8 (eight) hours as needed for up to 9 doses for nausea. 03/25/21   [name], MD  pantoprazole (PROTONIX) 40 MG tablet Take 1 tablet (40 mg total) by mouth 2 (two) times daily. 03/25/21 05/24/21  [name], MD  predniSONE (DELTASONE) 5 MG tablet Take 5 mg by mouth daily. 02/21/21   [provider]  tacrolimus (PROGRAF) 1 MG capsule Take 1-2 mg by mouth See admin instructions. 2 mg in the morning 1 mg at bedtime    [provider]  VENTOLIN HFA 108 (90 Base) MCG/ACT inhaler Inhale 2 puffs into the lungs 2 (two) times daily as needed. 10/21/19   [provider]    Physical Exam: Vitals:   06/14/21 1838 06/14/21 1846 06/14/21 1900 06/14/21 2221  BP:   (!) 164/90 (!) 167/89  Pulse:   72 70  Resp:   (!) 21 17  Temp:    97.6 F (36.4 C)  TempSrc:      SpO2: 100% 99% 99% 100%  Weight:      Height:        Constitutional: NAD, calm, comfortable, drowsy female who repeatedly fell asleep during evaluation Vitals:   06/14/21 1838 06/14/21 1846 06/14/21 1900 06/14/21 2221  BP:   (!) 164/90 (!) 167/89  Pulse:   72 70  Resp:   (!) 21 17  Temp:    97.6 F (36.4 C)  TempSrc:      SpO2: 100% 99% 99% 100%  Weight:      Height:       Eyes: lids and conjunctivae normal ENMT: Mucous membranes are moist.  Neck: normal, supple Respiratory: Diffuse expiratory wheezing and rhonchi throughout.  Deep respirations while asleep on 2 L via nasal cannula cardiovascular: Regular rate and rhythm, no murmurs / rubs / gallops. No extremity edema.   Abdomen: no tenderness, no masses palpated.  Bowel  sounds positive.  Musculoskeletal: no clubbing / cyanosis. No joint deformity upper and lower extremities. Good ROM, no contractures. Normal muscle tone.  Skin: no rashes, lesions, ulcers. No induration Neurologic: CN 2-12 grossly intact.  Psychiatric: Normal judgment and insight.  Normal mood.     Labs on Admission: I have personally reviewed following labs and imaging studies  CBC: Recent Labs  Lab 06/14/21 1319  WBC 10.9*  NEUTROABS 8.0*  HGB 10.7*  HCT 35.4*  MCV 88.9  PLT 527   Basic Metabolic Panel: Recent Labs  Lab 06/14/21 1319  NA 138  K 3.2*  CL 100  CO2 26  GLUCOSE 138*  BUN 34*  CREATININE 3.62*  CALCIUM 9.4   GFR: Estimated Creatinine Clearance: 12.6 mL/min (A) (by C-G formula based on SCr of 3.62 mg/dL (H)). Liver Function Tests: No results for input(s): AST, ALT, ALKPHOS, BILITOT, PROT, ALBUMIN in the last 168 hours. No results for input(s): LIPASE, AMYLASE in the last 168 hours. No results for input(s): AMMONIA in the last 168 hours. Coagulation Profile: No results for input(s): INR, PROTIME in the last 168 hours. Cardiac Enzymes: No results for input(s): CKTOTAL, CKMB, CKMBINDEX, TROPONINI in the last 168 hours. BNP (last 3 results) No results for input(s): PROBNP in the last 8760 hours. HbA1C: No results for input(s): HGBA1C in the last 72 hours.  CBG: No results for input(s): GLUCAP in the last 168 hours. Lipid Profile: No results for input(s): CHOL, HDL, LDLCALC, TRIG, CHOLHDL, LDLDIRECT in the last 72 hours. Thyroid Function Tests: No results for input(s): TSH, T4TOTAL, FREET4, T3FREE, THYROIDAB in the last 72 hours. Anemia Panel: No results for input(s): VITAMINB12, FOLATE, FERRITIN, TIBC, IRON, RETICCTPCT in the last 72 hours. Urine analysis:    Component Value Date/Time   COLORURINE YELLOW 03/23/2021 0600   APPEARANCEUR HAZY (A) 03/23/2021 0600   LABSPEC 1.009 03/23/2021 0600   PHURINE 6.0 03/23/2021 0600   GLUCOSEU 50 (A)  03/23/2021 0600   HGBUR SMALL (A) 03/23/2021 0600   HGBUR negative 10/04/2009 1044   BILIRUBINUR NEGATIVE 03/23/2021 0600   BILIRUBINUR negative 12/13/2017 1508   KETONESUR NEGATIVE 03/23/2021 0600   PROTEINUR NEGATIVE 03/23/2021 0600   UROBILINOGEN 0.2 12/13/2017 1508   UROBILINOGEN 0.2 03/25/2012 1354   NITRITE NEGATIVE 03/23/2021 0600   LEUKOCYTESUR LARGE (A) 03/23/2021 0600    Radiological Exams on Admission: DG Chest 2 View  Result Date: 06/14/2021 CLINICAL DATA:  Shortness of breath. EXAM: CHEST - 2 VIEW COMPARISON:  June 13, 2021. FINDINGS: Stable cardiomegaly. Single lead left-sided pacemaker is unchanged in position. Both lungs are clear. The visualized skeletal structures are unremarkable. IMPRESSION: No active cardiopulmonary disease. Electronically Signed   By: Marijo Conception M.D.   On: 06/14/2021 13:19      Assessment/Plan  Acute hypoxia secondary to asthma exacerbation -Admitted on 2 L via nasal cannula -Received IV magnesium, continuous albuterol in the ED -Continue PRN albuterol nebulizer q4 hr -Wean O2 as tolerated  History of combined systolic and diastolic CHF s/p pacemaker -Patient has significant elevated BNP of greater than 2000 with mildly elevated troponin of 43->46.  However she appears clinically euvolemic and appears compensated  AKI History of kidney transplant in 2017-follows at [facility] -Creatinine elevated to 3.62 from a prior of 2.25 -give 500 cc LR bolus -Avoid nephrotoxic agent and contrast study -Continue tacrolimus and mycophenolate  History of CVA Continue aspirin  History of persistent atrial fibrillation S/p watchman device in 2019 on anticoagulation  DVT prophylaxis: Heparin subcu code Status: Full Family Communication: Plan discussed with patient at bedside  disposition Plan: Home with at least 2 midnight stays  Consults called:  Admission status: inpatient  Level of care: Telemetry  Status is: Inpatient  Remains  inpatient appropriate because: Asthma exacerbation requiring supplemental oxygen         Mallery Harshman T Kitt Ledet DO [facility]   If 7PM-7AM, please contact night-coverage [email or web address]   06/15/2021, 12:50 AM

## 2021-06-15 NOTE — Progress Notes (Signed)
The patient is injury-free, afebrile, alert, and oriented X 4. Pt's VS within baseline on 2 L [place]. She reports SOB with exertion. She denies s/s of chest pain, nausea, vomiting, dizziness, signs or symptoms of bleeding or acute changes during this shift. We will continue to monitor and work toward achieving the care plan goals

## 2021-06-15 NOTE — Assessment & Plan Note (Signed)
Presents with 3 days of shortness of breath.  86% on room air.  Currently on 2 LPM. Bilateral expiratory wheezing with tachypnea. Continue with current therapy for asthma exacerbation Continue steroids.

## 2021-06-15 NOTE — Progress Notes (Signed)
[facility] Progress Note  Patient: [name]    IOX:[id number]  DOA: 06/14/2021    Date of Service: the patient was seen and examined on 06/15/2021  Brief hospital course: No notes on file  Assessment and Plan: * Acute respiratory failure with hypoxia ([place]) Presents with 3 days of shortness of breath.  86% on room air.  Currently on 2 LPM. Bilateral expiratory wheezing with tachypnea. Continue with current therapy for asthma exacerbation Continue steroids.  Asthma, chronic, unspecified asthma severity, with acute exacerbation See hypoxic respiratory failure  Immunosuppression ([place]) Immunosuppressed for renal transplant history. Continue current regimen. Placing the patient at high risk for poor outcome  Renal transplant recipient Continue prednisone, Myfortic, and Prograf.  No dose change.  Acute renal failure superimposed on stage 4 chronic kidney disease (HCC) Baseline serum creatinine around 3.2.  On presentation serum creatinine 3.62.  Currently trending down to around baseline.  Monitor.  Chronic combined systolic and diastolic CHF (congestive heart failure) (HCC) Volume status adequate.  No evidence of volume overload.  Monitor.  Hypertension Blood pressure mildly elevated.  For now we will continue home regimen.  Monitor.    Body mass index is 20.18 kg/m.        Subjective: Continues to have shortness of breath.  No nausea no vomiting.  No fever no chills.  No chest pain.  Has some cough as well.  Objective: Tachypneic, hypoxic on room air, tachycardic  Exam: General: Appear in mild distress, no Rash; Oral Mucosa Clear, moist. no Abnormal Neck Mass Or lumps, Conjunctiva normal  Cardiovascular: S1 and S2 Present, no Murmur, Respiratory: increased respiratory effort, Bilateral Air entry present and bilateral  Crackles, bilateral wheezes Abdomen: Bowel Sound present, Soft and no tenderness Extremities: trace Pedal edema Neurology: alert and oriented  to time, place, and person affect appropriate. no new focal deficit Gait not checked due to patient safety concerns     Data Reviewed: My review of labs, imaging, notes and other tests shows no new significant findings.   Disposition:  Status is: Inpatient  Remains inpatient appropriate because: Ongoing hypoxia as well as shortness of breath and respiratory distress.  Requiring inpatient nebulizer therapy.   Family Communication: Family at bedside.  DVT Prophylaxis: heparin injection 5,000 Units Start: 06/14/21 2345   Time spent: 35 minutes.   Author: Berle Mull  06/15/2021 7:49 PM  To reach On-call, see care teams to locate the attending and reach out via [email or web address]. Between 7PM-7AM, please contact night-coverage If you still have difficulty reaching the attending provider, please page the [facility] (Director on Call) for [facility] on amion for assistance.

## 2021-06-15 NOTE — Assessment & Plan Note (Signed)
Continue prednisone, Myfortic, and Prograf.  No dose change.

## 2021-06-15 NOTE — Assessment & Plan Note (Signed)
Volume status adequate.  No evidence of volume overload.  Monitor.

## 2021-06-15 NOTE — Assessment & Plan Note (Signed)
Baseline serum creatinine around 3.2.  On presentation serum creatinine 3.62.  Currently trending down to around baseline.  Monitor.

## 2021-06-15 NOTE — Assessment & Plan Note (Signed)
Immunosuppressed for renal transplant history. Continue current regimen. Placing the patient at high risk for poor outcome

## 2021-06-15 NOTE — Assessment & Plan Note (Signed)
Blood pressure mildly elevated.  For now we will continue home regimen.  Monitor.

## 2021-06-15 NOTE — Assessment & Plan Note (Signed)
See hypoxic respiratory failure

## 2021-06-15 NOTE — Plan of Care (Signed)

## 2021-06-15 NOTE — Progress Notes (Signed)
Patient is CPAP non compliant but states she wishes to start back using her home mask to facilitate comfort when she is discharged. Order changed to prn for now.

## 2021-06-16 MED ORDER — IPRATROPIUM-ALBUTEROL 0.5-2.5 (3) MG/3ML IN SOLN: 3.0000 mL | Freq: Three times a day (TID) | RESPIRATORY_TRACT | 0 refills | Status: DC

## 2021-06-16 MED ORDER — CARVEDILOL 25 MG PO TABS: 25.0000 mg | ORAL_TABLET | Freq: Two times a day (BID) | ORAL | Status: DC

## 2021-06-16 MED ORDER — GUAIFENESIN ER 600 MG PO TB12: 600.0000 mg | ORAL_TABLET | Freq: Two times a day (BID) | ORAL | 2 refills | Status: DC

## 2021-06-16 MED ORDER — FUROSEMIDE 80 MG PO TABS: 80.0000 mg | ORAL_TABLET | Freq: Every day | ORAL | Status: DC

## 2021-06-16 MED ORDER — IPRATROPIUM-ALBUTEROL 0.5-2.5 (3) MG/3ML IN SOLN: 3.0000 mL | Freq: Two times a day (BID) | RESPIRATORY_TRACT | Status: DC

## 2021-06-16 MED ORDER — PREDNISONE 10 MG PO TABS: ORAL_TABLET | ORAL | 0 refills | Status: DC

## 2021-06-16 NOTE — Progress Notes (Signed)
PT demonstrated hands on understanding of Flutter device. 

## 2021-06-16 NOTE — Progress Notes (Signed)
SATURATION QUALIFICATIONS: (This note is used to comply with regulatory documentation for home oxygen)  Patient Saturations on Room Air at Rest = 94%  Patient Saturations on Room Air while Ambulating = 91%  Patient Saturations on 2 Liters of oxygen while Ambulating = 99%  Please briefly explain why patient needs home oxygen: pt desaturates below 92% without O2

## 2021-06-16 NOTE — Progress Notes (Signed)
Husband was present at D/C. No documents were scanned in to say he is legal guardian.

## 2021-06-20 ENCOUNTER — Telehealth: Payer: Self-pay

## 2021-06-20 ENCOUNTER — Telehealth: Payer: Self-pay | Admitting: Nurse Practitioner

## 2021-06-20 ENCOUNTER — Ambulatory Visit: Payer: Medicare Other

## 2021-06-20 DIAGNOSIS — I4819 Other persistent atrial fibrillation: Secondary | ICD-10-CM | POA: Diagnosis not present

## 2021-06-20 DIAGNOSIS — I130 Hypertensive heart and chronic kidney disease with heart failure and stage 1 through stage 4 chronic kidney disease, or unspecified chronic kidney disease: Secondary | ICD-10-CM | POA: Diagnosis not present

## 2021-06-20 DIAGNOSIS — N184 Chronic kidney disease, stage 4 (severe): Secondary | ICD-10-CM | POA: Diagnosis not present

## 2021-06-20 DIAGNOSIS — K269 Duodenal ulcer, unspecified as acute or chronic, without hemorrhage or perforation: Secondary | ICD-10-CM | POA: Diagnosis not present

## 2021-06-20 DIAGNOSIS — K5790 Diverticulosis of intestine, part unspecified, without perforation or abscess without bleeding: Secondary | ICD-10-CM | POA: Diagnosis not present

## 2021-06-20 DIAGNOSIS — I5030 Unspecified diastolic (congestive) heart failure: Secondary | ICD-10-CM | POA: Diagnosis not present

## 2021-06-20 NOTE — Telephone Encounter (Signed)
Requesting verbal orders for Phys therapy once a week for five weeks starting today, for gait training, strengthening, and transfers.   [place] : [phone] _ok to leave VM.

## 2021-06-20 NOTE — Assessment & Plan Note (Signed)
Baseline serum creatinine around 3.2.  On presentation serum creatinine 3.62.  Currently trending down to around baseline.  Monitor.

## 2021-06-20 NOTE — Assessment & Plan Note (Signed)
Continue prednisone, Myfortic, and Prograf.  No dose change.

## 2021-06-20 NOTE — Assessment & Plan Note (Signed)
Presents with 3 days of shortness of breath.  86% on room air on admission needing 2 LPM. Bilateral expiratory wheezing with tachypnea. Continue steroids. Oxygenation improved with therapy even on exertion now.

## 2021-06-20 NOTE — Discharge Summary (Signed)
Physician Discharge Summary   Patient name: [name]  Admit date:     06/14/2021  Discharge date: 06/16/2021   Discharge Physician: Berle Mull   PCP: [name], NP   Recommendations at discharge: follow up as recommended  Discharge Diagnoses Principal Problem:   Acute respiratory failure with hypoxia ([place]) Active Problems:   Asthma, chronic, unspecified asthma severity, with acute exacerbation   Renal transplant recipient   Immunosuppression ([place])   Acute renal failure superimposed on stage 4 chronic kidney disease ([place])   Chronic combined systolic and diastolic CHF (congestive heart failure) ([place])   Hypertension   S/P insertion of IVC (inferior vena caval) filter   History of CVA with residual deficit   Cardiac pacemaker in situ   Hospital Course   * Acute respiratory failure with hypoxia ([place]) Presents with 3 days of shortness of breath.  86% on room air on admission needing 2 LPM. Bilateral expiratory wheezing with tachypnea. Continue steroids. Oxygenation improved with therapy even on exertion now.   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 Continue current regimen. Placing the patient at high risk for poor outcome  Renal transplant recipient Continue prednisone, Myfortic, and Prograf.  No dose change.  Acute renal failure superimposed on stage 4 chronic kidney disease ([facility]) Baseline serum creatinine around 3.2.  On presentation serum creatinine 3.62.  Currently trending down to around baseline.  Monitor.  Chronic combined systolic and diastolic CHF (congestive heart failure) ([facility]) Volume status adequate.  No evidence of volume overload.  Monitor.  Hypertension Blood pressure mildly elevated.  For now we will continue home regimen.  Monitor.    Procedures performed: none   Condition at discharge:  good  Exam General: Appear in mild distress, no Rash; Oral Mucosa clear. no Abnormal Neck Mass Or lumps, Conjunctiva normal  Cardiovascular: S1 and S2 Present, no Murmur Respiratory: increased respiratory effort, Bilateral Air entry present and no Crackles, bilateral expiratory wheezes Abdomen: Bowel Sound present, Soft and no tenderness Extremities: trace Pedal edema Neurology: alert and oriented to time, place, and person affect appropriate. no new focal deficit  Disposition: Home  Discharge time: greater than 30 minutes.  Follow-up Information     [name], [name], NP. Schedule an appointment as soon as possible for a visit in 1 week(s).   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   albuterol 108 (90 Base) MCG/ACT inhaler Commonly known as: VENTOLIN HFA Inhale 1-2 puffs into the lungs every 6 (six)  hours as needed for wheezing or shortness of breath. What changed: Another medication with the same name was removed. Continue taking this medication, and follow the directions you see here.   albuterol (2.5 MG/3ML) 0.083% nebulizer solution Commonly known as: PROVENTIL Take 3 mLs (2.5 mg total) by nebulization every 6 (six) hours as  needed for wheezing or shortness of breath. Dx:J45.909 What changed: Another medication with the same name was removed. Continue taking this medication, and follow the directions you see here.   aspirin EC 81 MG tablet Take 81 mg by mouth daily. Swallow whole.   budesonide-formoterol 160-4.5 MCG/ACT inhaler Commonly known as: SYMBICORT Inhale 2 puffs into the lungs 2 (two) times daily.   calcitRIOL 0.25 MCG capsule Commonly known as: ROCALTROL Take 0.25 mcg by mouth daily.   carvedilol 12.5 MG tablet Commonly known as: COREG Take 2 tablets (25 mg total) by mouth 2 (two) times daily.   docusate sodium 100 MG capsule Commonly known as: COLACE Take 100 mg by mouth daily.   DULoxetine 30 MG capsule Commonly known as: CYMBALTA TAKE 1 CAPSULE BY MOUTH EVERY DAY What changed:  how much to take how to take this when to take this additional instructions   furosemide 80 MG tablet Commonly known as: LASIX Take 1 tablet (80 mg total) by mouth daily. Start taking on: June 21, 2021   guaiFENesin 600 MG 12 hr tablet Commonly known as: Mucinex Take 1 tablet (600 mg total) by mouth 2 (two) times daily.   ipratropium-albuterol 0.5-2.5 (3) MG/3ML Soln Commonly known as: DUONEB Take 3 mLs by nebulization 3 (three) times daily.   mycophenolate 180 MG EC tablet Commonly known as: MYFORTIC Take 180 mg by mouth 2 (two) times daily.   ondansetron 4 MG tablet Commonly known as: ZOFRAN Take 1 tablet (4 mg total) by mouth every 8 (eight) hours as needed for up to 9 doses for nausea.   pantoprazole 40 MG tablet Commonly known as: Protonix Take 1 tablet (40 mg total) by mouth 2 (two) times daily. What changed: when to take this   predniSONE 5 MG tablet Commonly known as: DELTASONE Take 5 mg by mouth daily. What changed: Another medication with the same name was added. Make sure you understand how and when to take each.   predniSONE 10 MG tablet Commonly known as: DELTASONE Take  50mg  daily for 3days,Take 40mg  daily for 3days,Take 30mg  daily for 3days,Take 20mg  daily for 3days,Take 10mg  daily for 3days, then stop this prescription and start taking 5 mg prednisone What changed: You were already taking a medication with the same name, and this prescription was added. Make sure you understand how and when to take each.   tacrolimus 1 MG capsule Commonly known as: PROGRAF Take 1 mg by mouth 2 (two) times daily.        Filed Weights   06/14/21 1219  Weight: 56.7 kg    DG Chest 2 View  Result Date: 06/15/2021 CLINICAL DATA:  Asthma EXAM: CHEST - 2 VIEW COMPARISON:  X-ray chest 06/13/2021. FINDINGS: Chronic interstitial changes. Pulmonary vascular congestion. Cardiomegaly. No pleural effusion or pneumothorax. Single lead left chest wall pacemaker and left atrial appendage occlusion. Cholecystectomy clips, IVC filter, and lap band are present. IMPRESSION: Pulmonary vascular congestion with cardiomegaly. Electronically Signed   By: Macy Mis M.D.   On: 06/15/2021 08:57   DG Chest 2 View  Result Date: 06/14/2021 CLINICAL DATA:  Shortness of breath. EXAM: CHEST - 2 VIEW COMPARISON:  June 13, 2021. FINDINGS: Stable cardiomegaly. Single lead left-sided pacemaker is unchanged in position. Both lungs are clear. The visualized skeletal structures are unremarkable. IMPRESSION: No active cardiopulmonary disease. Electronically Signed   By: Marijo Conception M.D.   On: 06/14/2021 13:19   Results for orders placed or performed during the hospital encounter of 06/14/21  Resp Panel by RT-PCR (Flu A&B, Covid) Nasopharyngeal Swab     Status: None   Collection Time: 06/14/21  2:17 PM   Specimen: Nasopharyngeal Swab; Nasopharyngeal(NP) swabs in vial transport medium  Result Value Ref Range Status   SARS Coronavirus 2 by RT PCR NEGATIVE NEGATIVE Final    Comment: (NOTE) SARS-CoV-2 target nucleic acids are NOT DETECTED.  The SARS-CoV-2 RNA is generally detectable in upper  respiratory specimens during the acute phase of infection. The lowest concentration of SARS-CoV-2 viral copies this assay can detect is 138 copies/mL. A negative result does not preclude SARS-Cov-2 infection and should not be used as the sole basis for treatment or other patient management decisions. A negative result may occur with  improper specimen collection/handling, submission of specimen other than nasopharyngeal swab, presence of viral mutation(s) within the areas targeted by this assay, and inadequate number of viral copies(<138 copies/mL). A negative result must be combined with clinical observations, patient history, and epidemiological information. The expected result is Negative.  Fact Sheet for Patients:  EntrepreneurPulse.com.au  Fact Sheet for Healthcare Providers:  IncredibleEmployment.be  This test is no t yet approved or cleared by the [place] FDA and  has been authorized for detection and/or diagnosis of SARS-CoV-2 by FDA under an Emergency Use Authorization (EUA). This EUA will remain  in effect (meaning this test can be used) for the duration of the COVID-19 declaration under Section 564(b)(1) of the Act, 21 U.S.C.section 360bbb-3(b)(1), unless the authorization is terminated  or revoked sooner.       Influenza A by PCR NEGATIVE NEGATIVE Final   Influenza B by PCR NEGATIVE NEGATIVE Final    Comment: (NOTE) The Xpert Xpress SARS-CoV-2/FLU/RSV plus assay is intended as an aid in the diagnosis of influenza from Nasopharyngeal swab specimens and should not be used as a sole basis for treatment. Nasal washings and aspirates are unacceptable for Xpert Xpress SARS-CoV-2/FLU/RSV testing.  Fact Sheet for Patients: EntrepreneurPulse.com.au  Fact Sheet for Healthcare Providers: IncredibleEmployment.be  This test is not yet approved or cleared by the [place] FDA and has been  authorized for detection and/or diagnosis of SARS-CoV-2 by FDA under an Emergency Use Authorization (EUA). This EUA will remain in effect (meaning this test can be used) for the duration of the COVID-19 declaration under Section 564(b)(1) of the Act, 21 U.S.C. section 360bbb-3(b)(1), unless the authorization is terminated or revoked.  Performed at [facility], [address] [place], [place] [address]    Recent Labs  Lab 06/14/21 1319  WBC 10.9*  NEUTROABS 8.0*  HGB 10.7*  HCT 35.4*  MCV 88.9  PLT 166   Recent Labs  Lab 06/14/21 1319 06/15/21 0651  NA 138 135  K 3.2* 3.5  CL 100 101  CO2 26 23  GLUCOSE 138* 133*  BUN 34* 38*  CREATININE 3.62* 3.21*  CALCIUM 9.4 8.9   No results for input(s): AST, ALT, ALKPHOS, BILITOT, PROT, ALBUMIN in the last 168 hours. No results for input(s): GLUCAP in the last 168 hours.  Author: Berle Mull

## 2021-06-20 NOTE — Assessment & Plan Note (Signed)
Volume status adequate.  No evidence of volume overload.  Monitor.

## 2021-06-20 NOTE — Assessment & Plan Note (Signed)
See hypoxic respiratory failure

## 2021-06-20 NOTE — Assessment & Plan Note (Signed)
Immunosuppressed for renal transplant history. Continue current regimen. Placing the patient at high risk for poor outcome

## 2021-06-20 NOTE — Assessment & Plan Note (Signed)
Blood pressure mildly elevated.  For now we will continue home regimen.  Monitor.

## 2021-06-20 NOTE — Telephone Encounter (Signed)
Called patient for Mwv , No answer , patient may reschedule for next available appointment.  L.Zyier Dykema,LPN

## 2021-06-21 ENCOUNTER — Inpatient Hospital Stay (HOSPITAL_COMMUNITY): Admission: RE | Admit: 2021-06-21 | Payer: Medicare Other | Source: Ambulatory Visit

## 2021-06-21 ENCOUNTER — Encounter: Payer: Self-pay | Admitting: Nurse Practitioner

## 2021-06-21 ENCOUNTER — Ambulatory Visit (INDEPENDENT_AMBULATORY_CARE_PROVIDER_SITE_OTHER): Payer: Medicare Other | Admitting: Nurse Practitioner

## 2021-06-21 ENCOUNTER — Other Ambulatory Visit: Payer: Self-pay

## 2021-06-21 VITALS — BP 136/60 | HR 70 | Temp 96.9°F | Wt 126.2 lb

## 2021-06-21 DIAGNOSIS — J4541 Moderate persistent asthma with (acute) exacerbation: Secondary | ICD-10-CM | POA: Diagnosis not present

## 2021-06-21 DIAGNOSIS — K269 Duodenal ulcer, unspecified as acute or chronic, without hemorrhage or perforation: Secondary | ICD-10-CM | POA: Diagnosis not present

## 2021-06-21 DIAGNOSIS — I5030 Unspecified diastolic (congestive) heart failure: Secondary | ICD-10-CM | POA: Diagnosis not present

## 2021-06-21 DIAGNOSIS — K5790 Diverticulosis of intestine, part unspecified, without perforation or abscess without bleeding: Secondary | ICD-10-CM | POA: Diagnosis not present

## 2021-06-21 DIAGNOSIS — I130 Hypertensive heart and chronic kidney disease with heart failure and stage 1 through stage 4 chronic kidney disease, or unspecified chronic kidney disease: Secondary | ICD-10-CM | POA: Diagnosis not present

## 2021-06-21 DIAGNOSIS — N184 Chronic kidney disease, stage 4 (severe): Secondary | ICD-10-CM | POA: Diagnosis not present

## 2021-06-21 DIAGNOSIS — I4819 Other persistent atrial fibrillation: Secondary | ICD-10-CM | POA: Diagnosis not present

## 2021-06-21 MED ORDER — ALBUTEROL SULFATE (2.5 MG/3ML) 0.083% IN NEBU: 2.5000 mg | INHALATION_SOLUTION | Freq: Four times a day (QID) | RESPIRATORY_TRACT | 2 refills | Status: DC | PRN

## 2021-06-21 MED ORDER — ALBUTEROL SULFATE HFA 108 (90 BASE) MCG/ACT IN AERS: 1.0000 | INHALATION_SPRAY | Freq: Four times a day (QID) | RESPIRATORY_TRACT | 1 refills | Status: DC | PRN

## 2021-06-21 MED ORDER — ALBUTEROL SULFATE (2.5 MG/3ML) 0.083% IN NEBU: 2.5000 mg | INHALATION_SOLUTION | Freq: Once | RESPIRATORY_TRACT | Status: DC

## 2021-06-21 MED ORDER — HYDROCODONE BIT-HOMATROP MBR 5-1.5 MG/5ML PO SOLN
5.0000 mL | Freq: Three times a day (TID) | ORAL | 0 refills | Status: DC | PRN
Start: 2021-06-21 — End: 2021-07-13

## 2021-06-21 NOTE — Progress Notes (Signed)
Subjective:  Patient ID: [name], female    DOB: 03-08-1946  Age: 75 y.o. MRN: [id number]  CC: Follow-up (4 week f/u on weakness and appetite. /Pt states since last appointment she has been in the hospital (06/14/21-06/16/21) for a 3 day stay due to breathing issues and low oxygen. )  Accompanied by husband. Asthma She complains of cough, shortness of breath and wheezing. There is no chest tightness. This is a chronic problem. The current episode started 1 to 4 weeks ago. The problem occurs intermittently. The problem has been waxing and waning. The cough is productive of sputum. Associated symptoms include dyspnea on exertion. Pertinent negatives include no appetite change, fever, nasal congestion, orthopnea or PND. Her symptoms are aggravated by any activity. Her symptoms are alleviated by beta-agonist, steroid inhaler and oral steroids. She reports moderate improvement on treatment. Her symptoms are not alleviated by OTC cough suppressant. Her past medical history is significant for asthma.   Appt with nephrology next week.  Wt Readings from Last 3 Encounters:  06/21/21 126 lb 3.2 oz (57.2 kg)  06/14/21 125 lb (56.7 kg)  06/13/21 125 lb 12.8 oz (57.1 kg)    Reviewed past Medical, Social and Family history today.  Outpatient Medications Prior to Visit  Medication Sig Dispense Refill   acetaminophen (TYLENOL) 500 MG tablet Take 500 mg by mouth every 6 (six) hours as needed for mild pain or headache.      aspirin EC 81 MG tablet Take 81 mg by mouth daily. Swallow whole.     budesonide-formoterol (SYMBICORT) 160-4.5 MCG/ACT inhaler Inhale 2 puffs into the lungs 2 (two) times daily. 1 each 5   carvedilol (COREG) 12.5 MG tablet Take 2 tablets (25 mg total) by mouth 2 (two) times daily.     docusate sodium (COLACE) 100 MG capsule Take 100 mg by mouth daily.     DULoxetine (CYMBALTA) 30 MG capsule TAKE 1 CAPSULE BY MOUTH EVERY DAY (Patient taking differently: Take 30 mg by mouth daily.)  90 capsule 3   furosemide (LASIX) 80 MG tablet Take 1 tablet (80 mg total) by mouth daily.     guaiFENesin (MUCINEX) 600 MG 12 hr tablet Take 1 tablet (600 mg total) by mouth 2 (two) times daily. 60 tablet 2   mycophenolate (MYFORTIC) 180 MG EC tablet Take 180 mg by mouth 2 (two) times daily.     ondansetron (ZOFRAN) 4 MG tablet Take 1 tablet (4 mg total) by mouth every 8 (eight) hours as needed for up to 9 doses for nausea. 9 tablet 0   predniSONE (DELTASONE) 10 MG tablet Take 50mg  daily for 3days,Take 40mg  daily for 3days,Take 30mg  daily for 3days,Take 20mg  daily for 3days,Take 10mg  daily for 3days, then stop this prescription and start taking 5 mg prednisone 45 tablet 0   predniSONE (DELTASONE) 5 MG tablet Take 5 mg by mouth daily.     tacrolimus (PROGRAF) 1 MG capsule Take 1 mg by mouth 2 (two) times daily.     albuterol (PROVENTIL) (2.5 MG/3ML) 0.083% nebulizer solution Take 3 mLs (2.5 mg total) by nebulization every 6 (six) hours as needed for wheezing or shortness of breath. Dx:J45.909 150 mL 2   albuterol (VENTOLIN HFA) 108 (90 Base) MCG/ACT inhaler Inhale 1-2 puffs into the lungs every 6 (six) hours as needed for wheezing or shortness of breath.     ipratropium-albuterol (DUONEB) 0.5-2.5 (3) MG/3ML SOLN Take 3 mLs by nebulization 3 (three) times daily. 360 mL 0  pantoprazole (PROTONIX) 40 MG tablet Take 1 tablet (40 mg total) by mouth 2 (two) times daily. (Patient taking differently: Take 40 mg by mouth daily.) 120 tablet 0   calcitRIOL (ROCALTROL) 0.25 MCG capsule Take 0.25 mcg by mouth daily. (Patient not taking: Reported on 06/21/2021)     No facility-administered medications prior to visit.    ROS See HPI  Objective:  BP 136/60 (BP Location: Left Arm, Patient Position: Sitting, Cuff Size: Normal)   Pulse 70   Temp (!) 96.9 F (36.1 C) (Temporal)   Wt 126 lb 3.2 oz (57.2 kg)   SpO2 95%   BMI 20.37 kg/m   Physical Exam Cardiovascular:     Rate and Rhythm: Normal rate and  regular rhythm.  Pulmonary:     Effort: No respiratory distress.     Breath sounds: Wheezing, rhonchi and rales present.     Comments: Resolved wheezing and improved rhonchi post nebulizer treatment Chest:     Chest wall: No tenderness.  Musculoskeletal:     Right lower leg: No edema.     Left lower leg: No edema.  Neurological:     Mental Status: She is alert and oriented to person, place, and time.  Psychiatric:        Mood and Affect: Mood normal.        Behavior: Behavior normal.        Thought Content: Thought content normal.   Assessment & Plan:  This visit occurred during the SARS-CoV-2 public health emergency.  Safety protocols were in place, including screening questions prior to the visit, additional usage of staff PPE, and extensive cleaning of exam room while observing appropriate contact time as indicated for disinfecting solutions.   [name] was seen today for follow-up.  Diagnoses and all orders for this visit:  Moderate persistent asthmatic bronchitis with acute exacerbation -     albuterol (PROVENTIL) (2.5 MG/3ML) 0.083% nebulizer solution; Take 3 mLs (2.5 mg total) by nebulization every 6 (six) hours as needed for wheezing or shortness of breath. Dx:J45.909 -     albuterol (VENTOLIN HFA) 108 (90 Base) MCG/ACT inhaler; Inhale 1-2 puffs into the lungs every 6 (six) hours as needed for wheezing or shortness of breath. -     HYDROcodone bit-homatropine (HYCODAN) 5-1.5 MG/5ML syrup; Take 5 mLs by mouth every 8 (eight) hours as needed for cough. -     Discontinue: albuterol (PROVENTIL) (2.5 MG/3ML) 0.083% nebulizer solution 2.5 mg -     albuterol (PROVENTIL) (2.5 MG/3ML) 0.083% nebulizer solution 2.5 mg   Problem List Items Addressed This Visit       Respiratory   Asthmatic bronchitis without complication - Primary   Relevant Medications   albuterol (PROVENTIL) (2.5 MG/3ML) 0.083% nebulizer solution   albuterol (VENTOLIN HFA) 108 (90 Base) MCG/ACT inhaler    HYDROcodone bit-homatropine (HYCODAN) 5-1.5 MG/5ML syrup   albuterol (PROVENTIL) (2.5 MG/3ML) 0.083% nebulizer solution 2.5 mg (Start on 06/28/2021  1:15 PM)    Follow-up: Return if symptoms worsen or fail to improve.  Wilfred Lacy, NP

## 2021-06-21 NOTE — Telephone Encounter (Signed)
Verbal orders given  

## 2021-06-21 NOTE — Patient Instructions (Addendum)
Use albuterol nebulizer every 6-8hrs x 2days then as needed. Completed medications as prescribed Maintain upcoming appt with pulmonology, and nephrology.

## 2021-06-25 DIAGNOSIS — I130 Hypertensive heart and chronic kidney disease with heart failure and stage 1 through stage 4 chronic kidney disease, or unspecified chronic kidney disease: Secondary | ICD-10-CM | POA: Diagnosis not present

## 2021-06-25 DIAGNOSIS — I4819 Other persistent atrial fibrillation: Secondary | ICD-10-CM | POA: Diagnosis not present

## 2021-06-25 DIAGNOSIS — N184 Chronic kidney disease, stage 4 (severe): Secondary | ICD-10-CM | POA: Diagnosis not present

## 2021-06-25 DIAGNOSIS — Z7951 Long term (current) use of inhaled steroids: Secondary | ICD-10-CM | POA: Diagnosis not present

## 2021-06-25 DIAGNOSIS — J45901 Unspecified asthma with (acute) exacerbation: Secondary | ICD-10-CM | POA: Diagnosis not present

## 2021-06-25 DIAGNOSIS — Z940 Kidney transplant status: Secondary | ICD-10-CM | POA: Diagnosis not present

## 2021-06-25 DIAGNOSIS — Z950 Presence of cardiac pacemaker: Secondary | ICD-10-CM | POA: Diagnosis not present

## 2021-06-25 DIAGNOSIS — G4733 Obstructive sleep apnea (adult) (pediatric): Secondary | ICD-10-CM | POA: Diagnosis not present

## 2021-06-25 DIAGNOSIS — F32A Depression, unspecified: Secondary | ICD-10-CM | POA: Diagnosis not present

## 2021-06-25 DIAGNOSIS — Z79899 Other long term (current) drug therapy: Secondary | ICD-10-CM | POA: Diagnosis not present

## 2021-06-25 DIAGNOSIS — D849 Immunodeficiency, unspecified: Secondary | ICD-10-CM | POA: Diagnosis not present

## 2021-06-25 DIAGNOSIS — K5730 Diverticulosis of large intestine without perforation or abscess without bleeding: Secondary | ICD-10-CM | POA: Diagnosis not present

## 2021-06-25 DIAGNOSIS — Z86718 Personal history of other venous thrombosis and embolism: Secondary | ICD-10-CM | POA: Diagnosis not present

## 2021-06-25 DIAGNOSIS — Z9181 History of falling: Secondary | ICD-10-CM | POA: Diagnosis not present

## 2021-06-25 DIAGNOSIS — Z7952 Long term (current) use of systemic steroids: Secondary | ICD-10-CM | POA: Diagnosis not present

## 2021-06-25 DIAGNOSIS — Z8673 Personal history of transient ischemic attack (TIA), and cerebral infarction without residual deficits: Secondary | ICD-10-CM | POA: Diagnosis not present

## 2021-06-25 DIAGNOSIS — I5042 Chronic combined systolic (congestive) and diastolic (congestive) heart failure: Secondary | ICD-10-CM | POA: Diagnosis not present

## 2021-06-25 DIAGNOSIS — K269 Duodenal ulcer, unspecified as acute or chronic, without hemorrhage or perforation: Secondary | ICD-10-CM | POA: Diagnosis not present

## 2021-06-25 DIAGNOSIS — Z7982 Long term (current) use of aspirin: Secondary | ICD-10-CM | POA: Diagnosis not present

## 2021-06-25 DIAGNOSIS — D631 Anemia in chronic kidney disease: Secondary | ICD-10-CM | POA: Diagnosis not present

## 2021-06-25 DIAGNOSIS — E875 Hyperkalemia: Secondary | ICD-10-CM | POA: Diagnosis not present

## 2021-06-26 DIAGNOSIS — I5042 Chronic combined systolic (congestive) and diastolic (congestive) heart failure: Secondary | ICD-10-CM | POA: Diagnosis not present

## 2021-06-26 DIAGNOSIS — J45901 Unspecified asthma with (acute) exacerbation: Secondary | ICD-10-CM | POA: Diagnosis not present

## 2021-06-26 DIAGNOSIS — D631 Anemia in chronic kidney disease: Secondary | ICD-10-CM | POA: Diagnosis not present

## 2021-06-26 DIAGNOSIS — I4819 Other persistent atrial fibrillation: Secondary | ICD-10-CM | POA: Diagnosis not present

## 2021-06-26 DIAGNOSIS — I130 Hypertensive heart and chronic kidney disease with heart failure and stage 1 through stage 4 chronic kidney disease, or unspecified chronic kidney disease: Secondary | ICD-10-CM | POA: Diagnosis not present

## 2021-06-26 DIAGNOSIS — N184 Chronic kidney disease, stage 4 (severe): Secondary | ICD-10-CM | POA: Diagnosis not present

## 2021-06-27 DIAGNOSIS — E876 Hypokalemia: Secondary | ICD-10-CM | POA: Diagnosis not present

## 2021-06-27 DIAGNOSIS — I130 Hypertensive heart and chronic kidney disease with heart failure and stage 1 through stage 4 chronic kidney disease, or unspecified chronic kidney disease: Secondary | ICD-10-CM | POA: Diagnosis not present

## 2021-06-27 DIAGNOSIS — I120 Hypertensive chronic kidney disease with stage 5 chronic kidney disease or end stage renal disease: Secondary | ICD-10-CM | POA: Diagnosis not present

## 2021-06-27 DIAGNOSIS — R809 Proteinuria, unspecified: Secondary | ICD-10-CM | POA: Diagnosis not present

## 2021-06-27 DIAGNOSIS — F32A Depression, unspecified: Secondary | ICD-10-CM | POA: Diagnosis not present

## 2021-06-27 DIAGNOSIS — Z940 Kidney transplant status: Secondary | ICD-10-CM | POA: Diagnosis not present

## 2021-06-27 DIAGNOSIS — N185 Chronic kidney disease, stage 5: Secondary | ICD-10-CM | POA: Diagnosis not present

## 2021-06-27 DIAGNOSIS — N2581 Secondary hyperparathyroidism of renal origin: Secondary | ICD-10-CM | POA: Diagnosis not present

## 2021-06-27 DIAGNOSIS — I5042 Chronic combined systolic (congestive) and diastolic (congestive) heart failure: Secondary | ICD-10-CM | POA: Diagnosis not present

## 2021-06-27 DIAGNOSIS — N179 Acute kidney failure, unspecified: Secondary | ICD-10-CM | POA: Diagnosis not present

## 2021-06-27 DIAGNOSIS — D849 Immunodeficiency, unspecified: Secondary | ICD-10-CM | POA: Diagnosis not present

## 2021-06-27 DIAGNOSIS — K922 Gastrointestinal hemorrhage, unspecified: Secondary | ICD-10-CM | POA: Diagnosis not present

## 2021-06-27 DIAGNOSIS — J45901 Unspecified asthma with (acute) exacerbation: Secondary | ICD-10-CM | POA: Diagnosis not present

## 2021-06-27 DIAGNOSIS — D631 Anemia in chronic kidney disease: Secondary | ICD-10-CM | POA: Diagnosis not present

## 2021-06-27 DIAGNOSIS — I4819 Other persistent atrial fibrillation: Secondary | ICD-10-CM | POA: Diagnosis not present

## 2021-06-27 DIAGNOSIS — N184 Chronic kidney disease, stage 4 (severe): Secondary | ICD-10-CM | POA: Diagnosis not present

## 2021-06-28 MED ORDER — ALBUTEROL SULFATE (2.5 MG/3ML) 0.083% IN NEBU: 2.5000 mg | INHALATION_SOLUTION | Freq: Once | RESPIRATORY_TRACT | Status: DC

## 2021-06-30 ENCOUNTER — Ambulatory Visit
Admission: RE | Admit: 2021-06-30 | Discharge: 2021-06-30 | Disposition: A | Discharge status: Home / Self Care | Payer: Medicare Other | Source: Ambulatory Visit | Attending: Nephrology | Admitting: Nephrology

## 2021-06-30 ENCOUNTER — Telehealth: Payer: Self-pay | Admitting: Nurse Practitioner

## 2021-06-30 ENCOUNTER — Other Ambulatory Visit: Payer: Self-pay

## 2021-06-30 ENCOUNTER — Other Ambulatory Visit: Payer: Self-pay | Admitting: Nephrology

## 2021-06-30 DIAGNOSIS — R0602 Shortness of breath: Secondary | ICD-10-CM | POA: Diagnosis not present

## 2021-06-30 DIAGNOSIS — B370 Candidal stomatitis: Secondary | ICD-10-CM

## 2021-06-30 IMAGING — CR DG CHEST 2V
3 series · 3 of 3 positions shown · non-contrast
Comparison: [DATE]

CLINICAL DATA: Shortness of breath.  Transplant patient.

EXAM:
CHEST - 2 VIEW

[w chest pa]
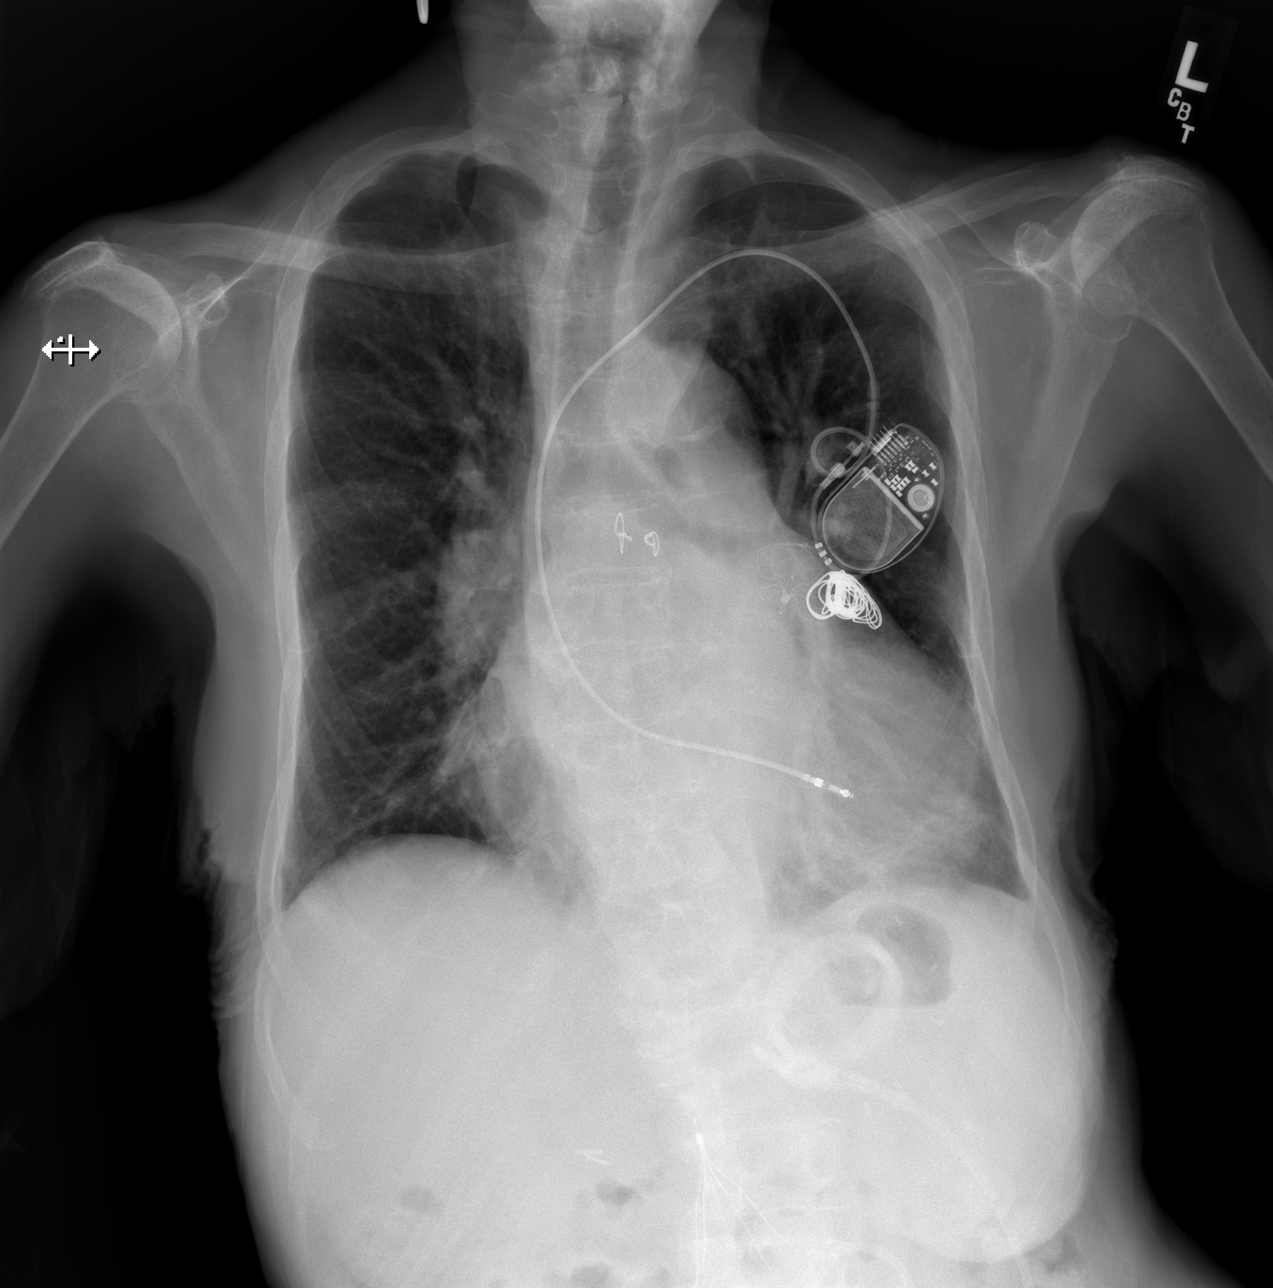

[w chest lat (1 of 2)]
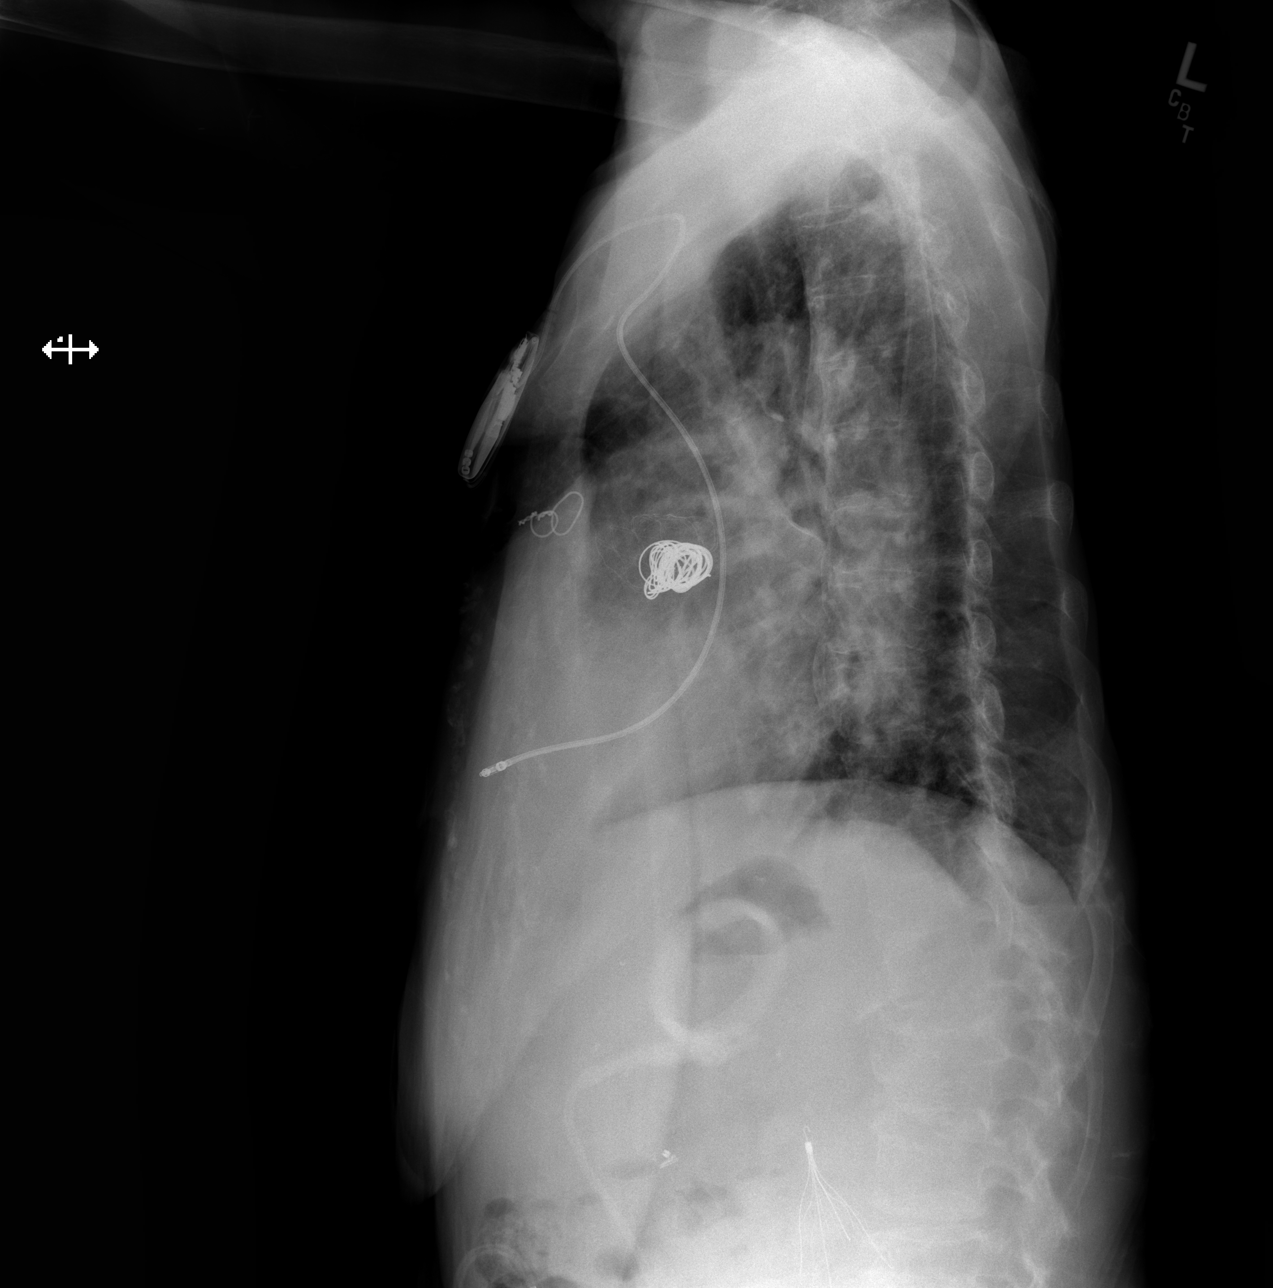

[w chest lat (2 of 2)]
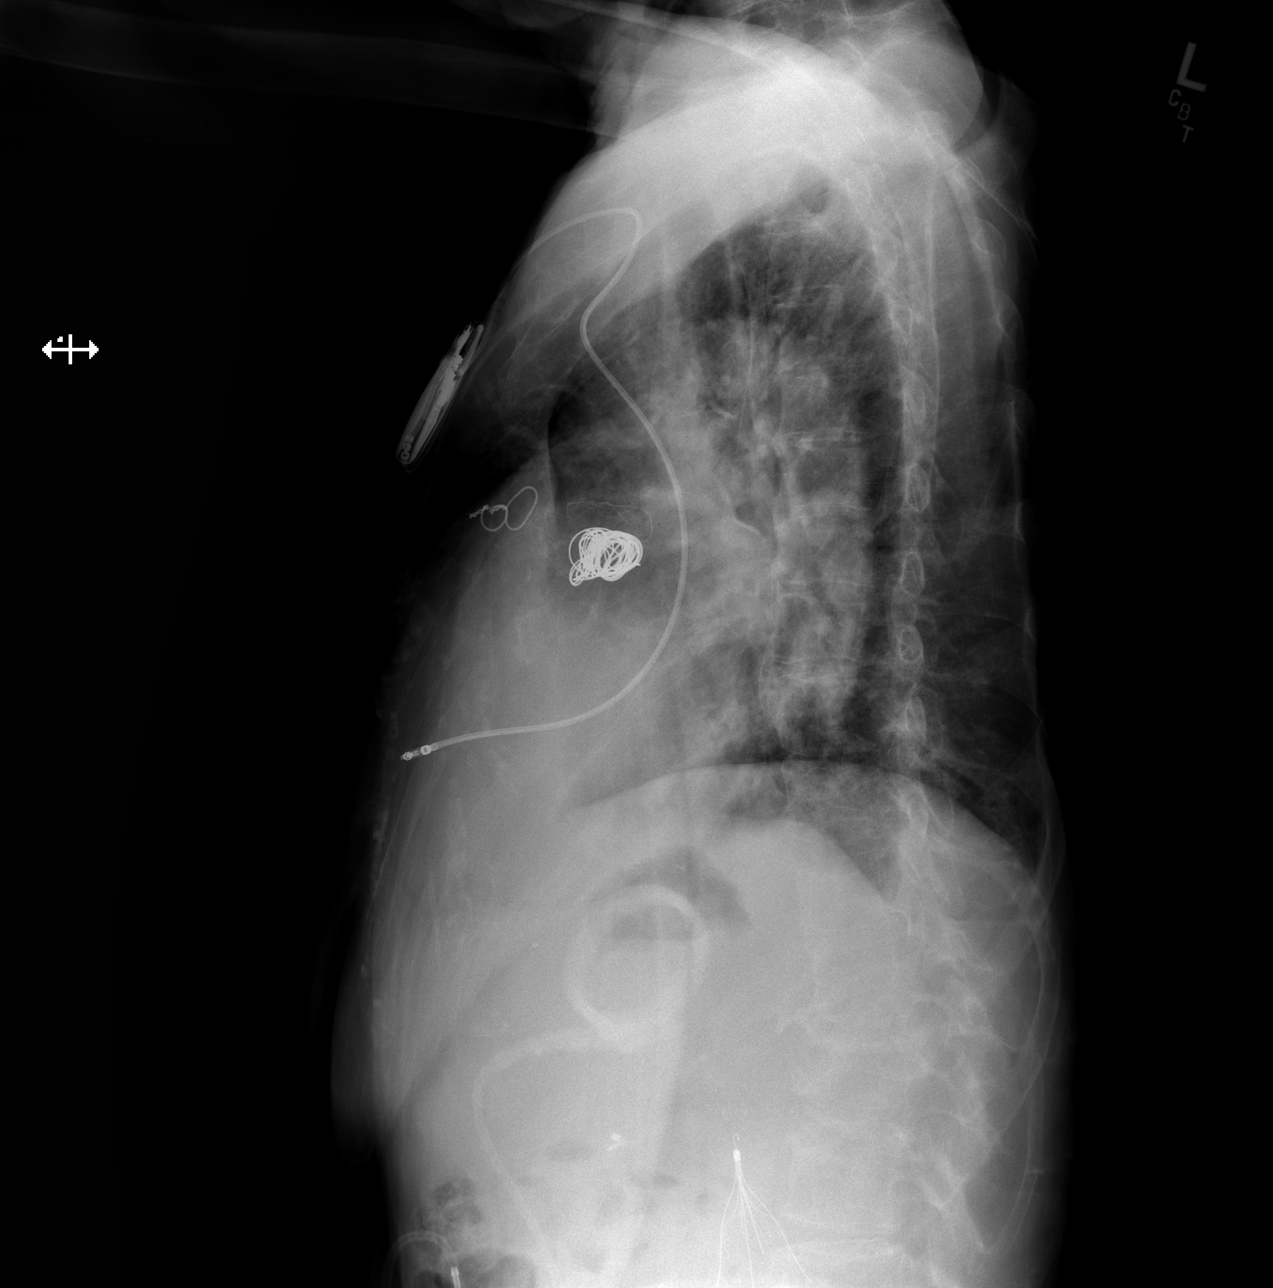

[3 of 3 positions shown; findings below may reference images not displayed]

FINDINGS: Cardiomegaly. The hila and mediastinum are unchanged. No
pneumothorax. No nodules or masses. Possible mild opacity in left
base on the frontal view. Stable pacemaker. No other acute
abnormalities.
IMPRESSION: Possible mild opacity in the left base may represent atelectasis,
confluence of shadows, or developing infiltrate. Recommend
short-term follow-up imaging to ensure resolution.

## 2021-06-30 NOTE — Telephone Encounter (Signed)
Pt called wanting to speak to nurse about medication regarding for what she was seen for the other day. Call back [phone].

## 2021-07-03 ENCOUNTER — Telehealth: Payer: Self-pay | Admitting: Nurse Practitioner

## 2021-07-03 MED ORDER — NYSTATIN 100000 UNIT/ML MT SUSP: 5.0000 mL | Freq: Four times a day (QID) | OROMUCOSAL | 0 refills | Status: DC

## 2021-07-03 NOTE — Telephone Encounter (Signed)
Spoke with patient and she states she has thrush and would like a Rx sent in because she is having a hard time eating and swallowing. Please advise.

## 2021-07-05 ENCOUNTER — Encounter (HOSPITAL_COMMUNITY): Payer: Medicare Other

## 2021-07-05 NOTE — Telephone Encounter (Signed)
Patient notified and confirms she has picked up Rx.

## 2021-07-05 NOTE — Telephone Encounter (Signed)
Spoke with patient and informed her per Baldo Ash that during her last OV they discussed the benefits of a home health aid verses a personal companion and patient states she would like to look into her and her family looking for a companion instead of home health aid and she will contact our office if this changes. Pt states she just needed a reminder of what was discussed.    Form received for provider to completed for knee brace and patient states she does not want this and informed the company she does not want this as well and would like it disregarded. Forms shredded.

## 2021-07-06 DIAGNOSIS — N185 Chronic kidney disease, stage 5: Secondary | ICD-10-CM | POA: Diagnosis not present

## 2021-07-07 ENCOUNTER — Encounter (HOSPITAL_COMMUNITY): Payer: Self-pay

## 2021-07-07 ENCOUNTER — Inpatient Hospital Stay (HOSPITAL_COMMUNITY)
Admission: EM | Admit: 2021-07-07 | Discharge: 2021-07-13 | DRG: 381 | Disposition: A | Discharge status: Home Health | Payer: Medicare Other | Attending: Student | Admitting: Student

## 2021-07-07 DIAGNOSIS — F32A Depression, unspecified: Secondary | ICD-10-CM | POA: Diagnosis present

## 2021-07-07 DIAGNOSIS — Z940 Kidney transplant status: Secondary | ICD-10-CM

## 2021-07-07 DIAGNOSIS — Z8711 Personal history of peptic ulcer disease: Secondary | ICD-10-CM

## 2021-07-07 DIAGNOSIS — K449 Diaphragmatic hernia without obstruction or gangrene: Secondary | ICD-10-CM | POA: Diagnosis not present

## 2021-07-07 DIAGNOSIS — N185 Chronic kidney disease, stage 5: Secondary | ICD-10-CM | POA: Diagnosis present

## 2021-07-07 DIAGNOSIS — Z7982 Long term (current) use of aspirin: Secondary | ICD-10-CM

## 2021-07-07 DIAGNOSIS — L409 Psoriasis, unspecified: Secondary | ICD-10-CM | POA: Diagnosis present

## 2021-07-07 DIAGNOSIS — G4733 Obstructive sleep apnea (adult) (pediatric): Secondary | ICD-10-CM | POA: Diagnosis present

## 2021-07-07 DIAGNOSIS — M19012 Primary osteoarthritis, left shoulder: Secondary | ICD-10-CM | POA: Diagnosis present

## 2021-07-07 DIAGNOSIS — K922 Gastrointestinal hemorrhage, unspecified: Secondary | ICD-10-CM | POA: Diagnosis present

## 2021-07-07 DIAGNOSIS — Z20822 Contact with and (suspected) exposure to covid-19: Secondary | ICD-10-CM | POA: Diagnosis not present

## 2021-07-07 DIAGNOSIS — T8619 Other complication of kidney transplant: Secondary | ICD-10-CM | POA: Diagnosis not present

## 2021-07-07 DIAGNOSIS — K5520 Angiodysplasia of colon without hemorrhage: Secondary | ICD-10-CM | POA: Diagnosis present

## 2021-07-07 DIAGNOSIS — Z888 Allergy status to other drugs, medicaments and biological substances status: Secondary | ICD-10-CM

## 2021-07-07 DIAGNOSIS — Z8673 Personal history of transient ischemic attack (TIA), and cerebral infarction without residual deficits: Secondary | ICD-10-CM

## 2021-07-07 DIAGNOSIS — K31819 Angiodysplasia of stomach and duodenum without bleeding: Secondary | ICD-10-CM | POA: Diagnosis not present

## 2021-07-07 DIAGNOSIS — K2081 Other esophagitis with bleeding: Secondary | ICD-10-CM | POA: Diagnosis not present

## 2021-07-07 DIAGNOSIS — K2211 Ulcer of esophagus with bleeding: Principal | ICD-10-CM | POA: Diagnosis present

## 2021-07-07 DIAGNOSIS — K130 Diseases of lips: Secondary | ICD-10-CM | POA: Diagnosis present

## 2021-07-07 DIAGNOSIS — I4819 Other persistent atrial fibrillation: Secondary | ICD-10-CM | POA: Diagnosis present

## 2021-07-07 DIAGNOSIS — Z7951 Long term (current) use of inhaled steroids: Secondary | ICD-10-CM

## 2021-07-07 DIAGNOSIS — N179 Acute kidney failure, unspecified: Secondary | ICD-10-CM

## 2021-07-07 DIAGNOSIS — E7800 Pure hypercholesterolemia, unspecified: Secondary | ICD-10-CM | POA: Diagnosis present

## 2021-07-07 DIAGNOSIS — K5731 Diverticulosis of large intestine without perforation or abscess with bleeding: Secondary | ICD-10-CM | POA: Diagnosis not present

## 2021-07-07 DIAGNOSIS — L89152 Pressure ulcer of sacral region, stage 2: Secondary | ICD-10-CM | POA: Diagnosis not present

## 2021-07-07 DIAGNOSIS — I10 Essential (primary) hypertension: Secondary | ICD-10-CM | POA: Diagnosis not present

## 2021-07-07 DIAGNOSIS — I132 Hypertensive heart and chronic kidney disease with heart failure and with stage 5 chronic kidney disease, or end stage renal disease: Secondary | ICD-10-CM | POA: Diagnosis not present

## 2021-07-07 DIAGNOSIS — J45909 Unspecified asthma, uncomplicated: Secondary | ICD-10-CM | POA: Diagnosis not present

## 2021-07-07 DIAGNOSIS — D631 Anemia in chronic kidney disease: Secondary | ICD-10-CM | POA: Diagnosis not present

## 2021-07-07 DIAGNOSIS — K641 Second degree hemorrhoids: Secondary | ICD-10-CM | POA: Diagnosis present

## 2021-07-07 DIAGNOSIS — R195 Other fecal abnormalities: Secondary | ICD-10-CM | POA: Diagnosis not present

## 2021-07-07 DIAGNOSIS — L89312 Pressure ulcer of right buttock, stage 2: Secondary | ICD-10-CM | POA: Diagnosis not present

## 2021-07-07 DIAGNOSIS — K2210 Ulcer of esophagus without bleeding: Secondary | ICD-10-CM | POA: Diagnosis not present

## 2021-07-07 DIAGNOSIS — Z8249 Family history of ischemic heart disease and other diseases of the circulatory system: Secondary | ICD-10-CM

## 2021-07-07 DIAGNOSIS — K269 Duodenal ulcer, unspecified as acute or chronic, without hemorrhage or perforation: Secondary | ICD-10-CM | POA: Diagnosis not present

## 2021-07-07 DIAGNOSIS — Z950 Presence of cardiac pacemaker: Secondary | ICD-10-CM

## 2021-07-07 DIAGNOSIS — M1612 Unilateral primary osteoarthritis, left hip: Secondary | ICD-10-CM | POA: Diagnosis present

## 2021-07-07 DIAGNOSIS — R69 Illness, unspecified: Secondary | ICD-10-CM

## 2021-07-07 DIAGNOSIS — Z8719 Personal history of other diseases of the digestive system: Secondary | ICD-10-CM | POA: Diagnosis not present

## 2021-07-07 DIAGNOSIS — K921 Melena: Secondary | ICD-10-CM | POA: Diagnosis not present

## 2021-07-07 DIAGNOSIS — Z8701 Personal history of pneumonia (recurrent): Secondary | ICD-10-CM

## 2021-07-07 DIAGNOSIS — K3189 Other diseases of stomach and duodenum: Secondary | ICD-10-CM | POA: Diagnosis not present

## 2021-07-07 DIAGNOSIS — K2971 Gastritis, unspecified, with bleeding: Secondary | ICD-10-CM | POA: Diagnosis not present

## 2021-07-07 DIAGNOSIS — D649 Anemia, unspecified: Secondary | ICD-10-CM | POA: Diagnosis not present

## 2021-07-07 DIAGNOSIS — K644 Residual hemorrhoidal skin tags: Secondary | ICD-10-CM | POA: Diagnosis present

## 2021-07-07 DIAGNOSIS — Z79899 Other long term (current) drug therapy: Secondary | ICD-10-CM

## 2021-07-07 DIAGNOSIS — E869 Volume depletion, unspecified: Secondary | ICD-10-CM | POA: Diagnosis not present

## 2021-07-07 DIAGNOSIS — I120 Hypertensive chronic kidney disease with stage 5 chronic kidney disease or end stage renal disease: Secondary | ICD-10-CM | POA: Diagnosis not present

## 2021-07-07 DIAGNOSIS — I5032 Chronic diastolic (congestive) heart failure: Secondary | ICD-10-CM | POA: Diagnosis present

## 2021-07-07 DIAGNOSIS — K31811 Angiodysplasia of stomach and duodenum with bleeding: Secondary | ICD-10-CM | POA: Diagnosis not present

## 2021-07-07 DIAGNOSIS — I739 Peripheral vascular disease, unspecified: Secondary | ICD-10-CM | POA: Diagnosis present

## 2021-07-07 DIAGNOSIS — K209 Esophagitis, unspecified without bleeding: Secondary | ICD-10-CM | POA: Diagnosis not present

## 2021-07-07 DIAGNOSIS — K121 Other forms of stomatitis: Secondary | ICD-10-CM | POA: Diagnosis not present

## 2021-07-07 DIAGNOSIS — D62 Acute posthemorrhagic anemia: Secondary | ICD-10-CM | POA: Diagnosis present

## 2021-07-07 DIAGNOSIS — L8990 Pressure ulcer of unspecified site, unspecified stage: Secondary | ICD-10-CM | POA: Insufficient documentation

## 2021-07-07 DIAGNOSIS — E213 Hyperparathyroidism, unspecified: Secondary | ICD-10-CM | POA: Diagnosis present

## 2021-07-07 DIAGNOSIS — Z8261 Family history of arthritis: Secondary | ICD-10-CM

## 2021-07-07 DIAGNOSIS — Z86718 Personal history of other venous thrombosis and embolism: Secondary | ICD-10-CM

## 2021-07-07 DIAGNOSIS — D509 Iron deficiency anemia, unspecified: Secondary | ICD-10-CM | POA: Diagnosis not present

## 2021-07-07 DIAGNOSIS — Y830 Surgical operation with transplant of whole organ as the cause of abnormal reaction of the patient, or of later complication, without mention of misadventure at the time of the procedure: Secondary | ICD-10-CM | POA: Diagnosis present

## 2021-07-07 DIAGNOSIS — R7989 Other specified abnormal findings of blood chemistry: Secondary | ICD-10-CM

## 2021-07-07 DIAGNOSIS — F419 Anxiety disorder, unspecified: Secondary | ICD-10-CM | POA: Diagnosis present

## 2021-07-07 DIAGNOSIS — I420 Dilated cardiomyopathy: Secondary | ICD-10-CM | POA: Diagnosis not present

## 2021-07-07 DIAGNOSIS — I150 Renovascular hypertension: Secondary | ICD-10-CM | POA: Diagnosis not present

## 2021-07-07 DIAGNOSIS — K625 Hemorrhage of anus and rectum: Secondary | ICD-10-CM | POA: Diagnosis not present

## 2021-07-07 DIAGNOSIS — M1909 Primary osteoarthritis, other specified site: Secondary | ICD-10-CM | POA: Diagnosis present

## 2021-07-07 DIAGNOSIS — M19011 Primary osteoarthritis, right shoulder: Secondary | ICD-10-CM | POA: Diagnosis present

## 2021-07-07 LAB — CBC WITH DIFFERENTIAL/PLATELET
Abs Immature Granulocytes: 0.12 10*3/uL — ABNORMAL HIGH (ref 0.00–0.07)
Basophils Absolute: 0 10*3/uL (ref 0.0–0.1)
Basophils Relative: 0 %
Eosinophils Absolute: 0.1 10*3/uL (ref 0.0–0.5)
Eosinophils Relative: 1 %
HCT: 18 % — ABNORMAL LOW (ref 36.0–46.0)
Hemoglobin: 5.8 g/dL — CL (ref 12.0–15.0)
Immature Granulocytes: 1 %
Lymphocytes Relative: 9 %
Lymphs Abs: 1 10*3/uL (ref 0.7–4.0)
MCH: 26.6 pg (ref 26.0–34.0)
MCHC: 32.2 g/dL (ref 30.0–36.0)
MCV: 82.6 fL (ref 80.0–100.0)
Monocytes Absolute: 1 10*3/uL (ref 0.1–1.0)
Monocytes Relative: 9 %
Neutro Abs: 9.2 10*3/uL — ABNORMAL HIGH (ref 1.7–7.7)
Neutrophils Relative %: 80 %
Platelets: 168 10*3/uL (ref 150–400)
RBC: 2.18 MIL/uL — ABNORMAL LOW (ref 3.87–5.11)
RDW: 19.2 % — ABNORMAL HIGH (ref 11.5–15.5)
WBC: 11.5 10*3/uL — ABNORMAL HIGH (ref 4.0–10.5)
nRBC: 0 % (ref 0.0–0.2)

## 2021-07-07 LAB — I-STAT CHEM 8, ED
BUN: >130 mg/dL — ABNORMAL HIGH (ref 8–23)
Calcium, Ion: 1.11 mmol/L — ABNORMAL LOW (ref 1.15–1.40)
Chloride: 112 mmol/L — ABNORMAL HIGH (ref 98–111)
Creatinine, Ser: 4.3 mg/dL — ABNORMAL HIGH (ref 0.44–1.00)
Glucose, Bld: 81 mg/dL (ref 70–99)
HCT: 20 % — ABNORMAL LOW (ref 36.0–46.0)
Hemoglobin: 6.8 g/dL — CL (ref 12.0–15.0)
Potassium: 3.9 mmol/L (ref 3.5–5.1)
Sodium: 143 mmol/L (ref 135–145)
TCO2: 20 mmol/L — ABNORMAL LOW (ref 22–32)

## 2021-07-07 LAB — COMPREHENSIVE METABOLIC PANEL
ALT: 9 U/L (ref 0–44)
AST: 10 U/L — ABNORMAL LOW (ref 15–41)
Albumin: 2.6 g/dL — ABNORMAL LOW (ref 3.5–5.0)
Alkaline Phosphatase: 43 U/L (ref 38–126)
Anion gap: 10 (ref 5–15)
BUN: 118 mg/dL — ABNORMAL HIGH (ref 8–23)
CO2: 18 mmol/L — ABNORMAL LOW (ref 22–32)
Calcium: 7.5 mg/dL — ABNORMAL LOW (ref 8.9–10.3)
Chloride: 116 mmol/L — ABNORMAL HIGH (ref 98–111)
Creatinine, Ser: 3.69 mg/dL — ABNORMAL HIGH (ref 0.44–1.00)
GFR, Estimated: 12 mL/min — ABNORMAL LOW (ref >60)
Glucose, Bld: 71 mg/dL (ref 70–99)
Potassium: 4.3 mmol/L (ref 3.5–5.1)
Sodium: 144 mmol/L (ref 135–145)
Total Bilirubin: 0.9 mg/dL (ref 0.3–1.2)
Total Protein: 5.1 g/dL — ABNORMAL LOW (ref 6.5–8.1)

## 2021-07-07 LAB — RESP PANEL BY RT-PCR (FLU A&B, COVID) ARPGX2
Influenza A by PCR: NEGATIVE
Influenza B by PCR: NEGATIVE
SARS Coronavirus 2 by RT PCR: NEGATIVE

## 2021-07-07 MED ORDER — SODIUM CHLORIDE 0.9 % IV SOLN
10.0000 mL/h | Freq: Once | INTRAVENOUS | Status: AC
  Administered 2021-07-08: 04:00:00 10 mL/h via INTRAVENOUS

## 2021-07-07 MED ORDER — PANTOPRAZOLE INFUSION (NEW) - SIMPLE MED
8.0000 mg/h | INTRAVENOUS | Status: AC
  Administered 2021-07-07 – 2021-07-10 (×6): 8 mg/h via INTRAVENOUS
  Filled 2021-07-07 (×4): qty 100
  Filled 2021-07-07 (×4): qty 80
  Filled 2021-07-07 (×3): qty 100

## 2021-07-07 MED ORDER — PANTOPRAZOLE 80MG IVPB - SIMPLE MED
80.0000 mg | Freq: Once | INTRAVENOUS | Status: AC
  Administered 2021-07-07: 20:00:00 80 mg via INTRAVENOUS
  Filled 2021-07-07: qty 80

## 2021-07-07 NOTE — ED Provider Notes (Signed)
Emergency Medicine Provider Triage Evaluation Note  [name] , a 75 y.o. female  was evaluated in triage.  Pt complains of rectal bleeding.  Patient states that rectal bleeding started on Wednesday.  Patient first noticed spotting while having a bowel movement.  States that rectal bleeding has gotten progressively worse since then.  Patient states that bleeding is only present when she has a bowel movement.  Patient is unable to estimate the amount of blood seen after each bowel movement.  States that she had black and tarry stool yesterday and today as well.  Endorses some nausea.  Patient denies taking any blood thinners.  Review of Systems  Positive: Melena, blood in stool, nausea Negative: Abdominal pain, abdominal distention, lightheadedness, syncope vomiting, dysuria, hematuria, urinary urgency, fever, chills  Physical Exam  BP 117/70    Pulse 74    Resp 19    SpO2 98%  Gen:   Awake, no distress   Resp:  Normal effort  MSK:   Moves extremities without difficulty  Other:  Abdomen soft, nondistended, nontender.  No guarding or rebound tenderness.  Medical Decision Making  Medically screening exam initiated at 12:24 PM.  Appropriate orders placed.  [name] was informed that the remainder of the evaluation will be completed by another provider, this initial triage assessment does not replace that evaluation, and the importance of remaining in the ED until their evaluation is complete.  CBC, CMP, type and screen ordered.  Will defer any abdominal imaging until rectal exam performed by provider in the back.   Loni Beckwith, PA-C 07/07/21 [place], [place], MD 07/07/21 [phone]

## 2021-07-07 NOTE — ED Provider Notes (Signed)
Pt care assumed at 2330.  Patient with history of kidney transplant here for evaluation of melanotic stools for the last 3 days.  CBC pending at time of signout.  CBC with hemoglobin downtrending significantly when compared to priors.  Today's hemoglobin is 5.8.  Baseline hemoglobin is around 10.  Discussed with patient findings of labs and recommendation for transfusion, she is in agreement with plan.  Plan to transfuse 2 units PRBC.  Medicine consult for admission for ongoing treatment.  CRITICAL CARE Performed by: Quintella Reichert   Total critical care time: 35 minutes  Critical care time was exclusive of separately billable procedures and treating other patients.  Critical care was necessary to treat or prevent imminent or life-threatening deterioration.  Critical care was time spent personally by me on the following activities: development of treatment plan with patient and/or surrogate as well as nursing, discussions with consultants, evaluation of patient's response to treatment, examination of patient, obtaining history from patient or surrogate, ordering and performing treatments and interventions, ordering and review of laboratory studies, ordering and review of radiographic studies, pulse oximetry and re-evaluation of patient's condition.    Quintella Reichert, MD 07/08/21 [phone]

## 2021-07-07 NOTE — ED Provider Notes (Signed)
[facility] DEPT Provider Note   CSN: [id number] Arrival date & time: 07/07/21  1211     History Chief Complaint  Patient presents with   Rectal Bleeding    [name] is a 75 y.o. female.  HPI Patient reports that she started having a little bit of rectal bleeding 3 days ago.  There were some spots of red blood.  This is increased significantly over the past 2 days.  Patient reports today she had 3 dark bloody looking bowel movements.  She denies having any pain.  She reports she does get some nausea but has not had any vomiting.  He does experience generalized weakness but reports that she attributes that to other medical problems.  She also has felt short of breath.  She has not had any syncopal episodes.  No fevers no chills.  No urinary problems of pain burning urgency.  Patient takes a daily aspirin.  No other anticoagulants.  Patient reports that she did have a GI bleed and had to be hospitalized September of this year.  EMR indicates patient's prior GI bleed secondary to diverticulosis and duodenal ulcers..  Patient is seen by Dr. Collene Mares gastroenterology.    Past Medical History:  Diagnosis Date   Acute diastolic (congestive) heart failure (HCC) 08/12/2017   Acute GI bleeding 11/14/2019   Acute kidney injury superimposed on chronic kidney disease ([place]) 11/17/2017   AKI (acute kidney injury) ([place]) 11/04/2017   Anemia    Anxiety    ARF (acute renal failure) ([place]) 06/06/2011   Arthritis    "shoulders; arms; hips" (02/04/2018)   Asthma    Atrial fibrillation (HCC)    Atrial flutter ([place])    s/p aflutter ablation at [facility]   Bacteriuria, asymptomatic 11/14/2020   Benign hypertension with ESRD (end-stage renal disease) ([place])    Blood transfusion    never had a reaction to blood transfusion   Cardiomyopathy Mar 2013   Mild, EF 50-55% by Mar 2013 ECHO, diast dysfxn II   Cardiomyopathy ([place])    CHF (congestive heart failure) ([place]) 2005   CHF  (congestive heart failure) ([place])    Childhood asthma    Closed fracture of right distal radius 10/18/2016   CMV (cytomegalovirus infection) ([place]) 10/03/2015   Constipation    takes Miralax daily   Constipation    Depression    takes Zoloft daily   Distal radius fracture, right    DVT of lower extremity, bilateral ([place]) 12/21/11   "they're there now; been there for 2 wks"   Eczema    End stage renal disease ([place]) 11/06/2011   ESRD (end stage renal disease) on dialysis [facility]) 09/2011   07-19-2015 had Kidney transplant at [facility]; "don't get dialysis anymore" (02/04/2018)   Essential hypertension 07/07/2007   Qualifier: Diagnosis of  By: Garen Grams     Fractures, stress    in both feet--6 OR 7 YRS AGO--RESOLVED   Generalized edema [id number]   GI bleed 11/14/2019   Gout    doesn't require meds    HCAP (healthcare-associated pneumonia) 09/10/2011   Hearing difficulty    Hematoma of left lower leg 05/17/2020   High cholesterol    History of CVA (cerebrovascular accident) without residual deficits    History of hip replacement, total, right    History of pneumonia    Hx of Clostridium difficile infection    Hypercalcemia    09/10/11   Hyperparathyroidism ([place]) 04/07/2013   Hypertension    takes  Diltiazem daily    Hypomagnesemia 08/02/2015   Hypophosphatemia 11/08/2017   ICB (intracranial bleed) (HCC)    Immunosuppression ([place]) 07/25/2015   Memory changes    Morbid obesity ([place])    Nonischemic dilated cardiomyopathy ([place])    Obesity 04/07/2013   Oligouria    Oral mucositis 02/22/2018   OSA on CPAP    PAF (paroxysmal atrial fibrillation) (HCC)     HX OF CEREBRAL BLEED WHILE ON COUMADIN-SO PT NOT ON ANY BLOOD THINNERS NOW   Peripheral vascular disease ([place])    Persistent atrial fibrillation ([place]) 08/12/2017   Overview:  Added automatically from request for surgery [phone]  Formatting of this note might be different from the original. Added automatically from request for surgery [id number]    Presence of Watchman left atrial appendage closure device 10/04/2017   Proteinuria 09/04/2016   Psoriasis 08/07/2016   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/25/2015   S/P insertion of IVC (inferior vena caval) filter    Sepsis ([place]) 11/08/2017   Stress fracture    bilateral feet   Stroke ([place]) 2009   denies residual on 02/04/2018;  hemorrhagic now off coumadin   Stroke ([place]) 07/17/2007   denies residual, hemorrhagic now off coumadin   Stroke due to intracerebral hemorrhage ([place]) 2009   Suture reaction 09/04/2016   Transfusion history    Use of cane as ambulatory aid     Patient Active Problem List   Diagnosis Date Noted   Asthma, chronic, unspecified asthma severity, with acute exacerbation 06/15/2021   Acute renal failure superimposed on stage 4 chronic kidney disease ([place]) 06/15/2021   Chronic combined systolic and diastolic CHF (congestive heart failure) ([place]) 06/15/2021   Acute respiratory failure with hypoxia ([place]) 06/14/2021   Duodenal ulcer with hemorrhage 03/29/2021   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   Gastroesophageal reflux disease 06/29/2020   Iron deficiency anemia 06/29/2020   SOB (shortness of breath) 03/11/2020   Pneumonia due to COVID-19 virus 03/11/2020   Traumatic seroma of lower leg, right, subsequent encounter 01/28/2020   Other secondary pulmonary hypertension ([place]) 12/03/2019   Medication management 11/04/2019   Acute on chronic combined systolic and diastolic congestive heart failure ([place]) 10/22/2019   S/P AV nodal ablation 10/08/2018   Cardiac pacemaker in situ 04/18/2018   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   Personal history of PE (pulmonary embolism) 57/84/6962   Chronic systolic heart failure ([place]) 02/11/2018   Hyperkalemia 02/03/2018   Lower extremity edema 12/10/2017   Presence of Watchman left atrial appendage closure device 10/04/2017   Chronic respiratory failure with hypoxia ([place]) 10/03/2017   Dyspnea 07/26/2017   Atrial flutter  ([place])    Peripheral vascular disease ([place])    Oligouria    Hypertension    Fractures, stress    Eczema    Depression    Constipation    Arthritis    Exacerbation of asthma 04/01/2017   Closed fracture of right distal radius 10/18/2016   Proteinuria 09/04/2016   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/25/2015   Immunosuppression ([place]) 07/25/2015   Nonischemic dilated cardiomyopathy (HCC)    S/P insertion of IVC (inferior vena caval) filter 04/07/2013   Use of cane as ambulatory aid 04/07/2013   S/P hip replacement 03/25/2012   DVT of lower extremity, bilateral ([place]) 12/21/2011   Personal history of DVT (deep vein thrombosis) 12/08/2011   Obstructive sleep apnea 12/08/2011   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[place]) 10/24/2011   Pseudomonas aeruginosa infection 10/24/2011   Hypertensive kidney disease with CKD (chronic kidney  disease) stage V ([place]) 09/14/2011   Hypercalcemia    Gout 09/10/2011   Absence of right hip joint with antibiotic pacer 09/03/2011   Traumatic seroma of thigh ([place]) 06/11/2011   Clostridium difficile colitis 06/09/2011   Leukocytosis 06/08/2011   Symptomatic anemia 06/06/2011   SINUSITIS- ACUTE-NOS 09/27/2010   PAF (paroxysmal atrial fibrillation) ([place]) 11/25/2008   CVA 09/28/2008   History of CVA with residual deficit 09/28/2008   Hemiplegia ([place]) 09/21/2008   Psoriasis 10/20/2007   Asthmatic bronchitis without complication 40/02/6760    Past Surgical History:  Procedure Laterality Date   AV FISTULA PLACEMENT  09/28/2011   Procedure: ARTERIOVENOUS (AV) FISTULA CREATION;  Surgeon: Rosetta Posner, MD;  Location[facility] OR;  Service: Vascular;  Laterality: Left;   [place] DEFECT   CHOLECYSTECTOMY     1980's   COLONOSCOPY     COLONOSCOPY WITH PROPOFOL N/A 11/16/2019   Procedure: COLONOSCOPY WITH PROPOFOL;  Surgeon: Juanita Craver, MD;  Location: [facility] ENDOSCOPY;  Service: Endoscopy;  Laterality: N/A;   CYSTOSCOPY      many yrs ago   ESOPHAGOGASTRODUODENOSCOPY (EGD) WITH PROPOFOL N/A 11/16/2019   Procedure: ESOPHAGOGASTRODUODENOSCOPY (EGD) WITH PROPOFOL;  Surgeon: Juanita Craver, MD;  Location: [facility] ENDOSCOPY;  Service: Endoscopy;  Laterality: N/A;   ESOPHAGOGASTRODUODENOSCOPY (EGD) WITH PROPOFOL N/A 03/20/2021   Procedure: ESOPHAGOGASTRODUODENOSCOPY (EGD) WITH PROPOFOL;  Surgeon: Milus Banister, MD;  Location: [facility] ENDOSCOPY;  Service: Endoscopy;  Laterality: N/A;   FEMUR FRACTURE SURGERY Right 03/2011; 09/03/2011   "had 2, 2 wk apart in 2012; broke it again 08/2011 & had OR"   FRACTURE SURGERY     HEMOSTASIS CONTROL  03/20/2021   Procedure: HEMOSTASIS CONTROL;  Surgeon: Milus Banister, MD;  Location: [facility] ENDOSCOPY;  Service: Endoscopy;;   HOT HEMOSTASIS N/A 03/20/2021   Procedure: HOT HEMOSTASIS (ARGON PLASMA COAGULATION/BICAP);  Surgeon: Milus Banister, MD;  Location: [facility] ENDOSCOPY;  Service: Endoscopy;  Laterality: N/A;   I & D EXTREMITY  09/15/2011   Procedure: IRRIGATION AND DEBRIDEMENT EXTREMITY w REMOVAL OF HARDWARE;  Surgeon: [name], MD;  Location: [facility];  Service: Orthopedics;  Laterality: Right;   INCISION AND DRAINAGE HIP  12/21/2011   Procedure: IRRIGATION AND DEBRIDEMENT HIP;  Surgeon: [name], MD;  Location: [facility];  Service: Orthopedics;  Laterality: Right;  I&D RIGHT HIP WITH PLACEMENT ANTIBIOTIC SPACER   INSERTION OF DIALYSIS CATHETER     Procedure: INSERTION OF DIALYSIS CATHETER;  Surgeon: Rosetta Posner, MD;  Location: [facility];  Service: Vascular;  Laterality: Right;   IRRIGATION AND DEBRIDEMENT ABSCESS  12/21/11   right hip   JOINT REPLACEMENT     KIDNEY TRANSPLANT  07/19/15   LAPAROSCOPIC GASTRIC BANDING  2008   LEFT ATRIAL APPENDAGE OCCLUSION  10/04/2017   OPEN REDUCTION INTERNAL FIXATION (ORIF) DISTAL RADIAL FRACTURE Right 10/18/2016   Procedure: OPEN REDUCTION INTERNAL FIXATION (ORIF) RIGHT DISTAL RADIAL FRACTURE;  Surgeon: Marchia Bond, MD;  Location: [facility];  Service:  Orthopedics;  Laterality: Right;   TOTAL HIP ARTHROPLASTY Right 02/2011   right THA 02/2011, I&D/removal of hardware 09/2011,, repeat I&D Jun 2013, reimplantation R THA 03-26-2012   TOTAL HIP REVISION  03/25/2012   Procedure: TOTAL HIP REVISION;  Surgeon: [name], MD;  Location: [facility] ORS;  Service: Orthopedics;  Laterality: Right;  Right Total Hip Reimplantation   TUBAL LIGATION     VENA CAVA  FILTER PLACEMENT  11/2011     OB History   No obstetric history on file.     Family History  Problem Relation Age of Onset   Cancer Father    Diabetes Mother    Hypertension Mother    Arthritis Mother    Hodgkin's lymphoma Other 28       dscd---HODGKINS DISEASE   Hypertension Brother     Social History   Tobacco Use   Smoking status: Never   Smokeless tobacco: Never  Vaping Use   Vaping Use: Never used  Substance Use Topics   Alcohol use: Never   Drug use: Never    Home Medications Prior to Admission medications   Medication Sig Start Date End Date Taking? Authorizing Provider  acetaminophen (TYLENOL) 500 MG tablet Take 500 mg by mouth every 6 (six) hours as needed for mild pain or headache.     [provider]  albuterol (PROVENTIL) (2.5 MG/3ML) 0.083% nebulizer solution Take 3 mLs (2.5 mg total) by nebulization every 6 (six) hours as needed for wheezing or shortness of breath. Dx:J45.909 06/21/21   [name], [name], NP  albuterol (VENTOLIN HFA) 108 (90 Base) MCG/ACT inhaler Inhale 1-2 puffs into the lungs every 6 (six) hours as needed for wheezing or shortness of breath. 06/21/21   [name], [name], NP  aspirin EC 81 MG tablet Take 81 mg by mouth daily. Swallow whole.    [provider]  budesonide-formoterol (SYMBICORT) 160-4.5 MCG/ACT inhaler Inhale 2 puffs into the lungs 2 (two) times daily. 06/13/21   [name], MD  carvedilol (COREG) 12.5 MG tablet Take 2 tablets (25 mg total) by mouth 2 (two) times daily. 11/18/19   Aline August, MD  docusate  sodium (COLACE) 100 MG capsule Take 100 mg by mouth daily.    [provider]  DULoxetine (CYMBALTA) 30 MG capsule TAKE 1 CAPSULE BY MOUTH EVERY DAY Patient taking differently: Take 30 mg by mouth daily. 11/03/20   [name], [name], NP  furosemide (LASIX) 80 MG tablet Take 1 tablet (80 mg total) by mouth daily. 06/21/21   Lavina Hamman, MD  guaiFENesin (MUCINEX) 600 MG 12 hr tablet Take 1 tablet (600 mg total) by mouth 2 (two) times daily. 06/16/21 06/16/22  Lavina Hamman, MD  HYDROcodone bit-homatropine (HYCODAN) 5-1.5 MG/5ML syrup Take 5 mLs by mouth every 8 (eight) hours as needed for cough. 06/21/21   [name], [name], NP  mycophenolate (MYFORTIC) 180 MG EC tablet Take 180 mg by mouth 2 (two) times daily.    [provider]  nystatin (MYCOSTATIN) 100000 UNIT/ML suspension Take 5 mLs (500,000 Units total) by mouth 4 (four) times daily. 07/03/21   [name], [name], NP  ondansetron (ZOFRAN) 4 MG tablet Take 1 tablet (4 mg total) by mouth every 8 (eight) hours as needed for up to 9 doses for nausea. 03/25/21   [name], MD  pantoprazole (PROTONIX) 40 MG tablet Take 1 tablet (40 mg total) by mouth 2 (two) times daily. Patient taking differently: Take 40 mg by mouth daily. 03/25/21 05/24/21  [name], MD  predniSONE (DELTASONE) 10 MG tablet Take 50mg  daily for 3days,Take 40mg  daily for 3days,Take 30mg  daily for 3days,Take 20mg  daily for 3days,Take 10mg  daily for 3days, then stop this prescription and start taking 5 mg prednisone 06/17/21   Lavina Hamman, MD  predniSONE (DELTASONE) 5 MG tablet Take 5 mg by mouth daily. 02/21/21   [provider]  tacrolimus (PROGRAF) 1 MG capsule Take 1 mg by mouth 2 (two) times daily.    [provider]    Allergies    Warfarin, Warfarin sodium, Ace inhibitors, Amiodarone, and Amlodipine  Review of Systems   Review of Systems 10 systems reviewed and negative except as per HPI Physical  Exam Updated Vital Signs BP 117/70    Pulse 74    Temp 98 F (36.7 C) (Oral)    Resp 19    SpO2 98%   Physical Exam Constitutional:      Comments: Patient is alert.  Nontoxic.  Mental status clear.  No respiratory distress at rest.  HENT:     Head: Normocephalic and atraumatic.     Mouth/Throat:     Pharynx: Oropharynx is clear.  Eyes:     Extraocular Movements: Extraocular movements intact.  Cardiovascular:     Comments: Regular possible ectopy. Pulmonary:     Comments: No respiratory distress.  Possible crackle in the left base.  Good airflow in the right lung. Abdominal:     Comments: Abdomen soft and nontender.  No distention.  Genitourinary:    Comments: Patient has melanotic looking stool in her depends.  Digital exam is for fresher cranberry colored blood. Musculoskeletal:        General: Normal range of motion.     Right lower leg: No edema.     Left lower leg: No edema.  Skin:    General: Skin is warm and dry.     Coloration: Skin is pale.  Neurological:     General: No focal deficit present.     Mental Status: She is oriented to person, place, and time.     Coordination: Coordination normal.  Psychiatric:        Mood and Affect: Mood normal.    ED Results / Procedures / Treatments   Labs (all labs ordered are listed, but only abnormal results are displayed) Labs Reviewed  COMPREHENSIVE METABOLIC PANEL  CBC WITH DIFFERENTIAL/PLATELET  POC OCCULT BLOOD, ED  TYPE AND SCREEN    EKG EKG Interpretation  Date/Time:  Friday July 07 2021 18:24:58 EST Ventricular Rate:  70 PR Interval:    QRS Duration: 146 QT Interval:  421 QTC Calculation: 455 R Axis:   -78 Text Interpretation: Accelerated junctional rhythm Nonspecific IVCD with LAD LVH with secondary repolarization abnormality Anterior infarct, old no sig change from older tracings Confirmed by Charlesetta Shanks [id number]) on 07/07/2021 10:08:08 PM  Radiology No results found.  Procedures Procedures    Medications Ordered in ED Medications - No data to display  ED Course  I have reviewed the triage vital signs and the nursing notes.  Pertinent labs & imaging results that were available during my care of the patient were reviewed by me and considered in my medical decision making (see chart for details).    MDM Rules/Calculators/A&P                         Patient describes GI bleeding starting 3 days ago.  It has increased significantly in frequency as of today.  Patient is symptomatic with shortness of breath and general weakness.  No syncopal episodes.  Patient does have melena on exam.  EMR review indicates patient has significant bleeding in September 2022 requiring transfusion of 6 units of blood and cauterization of bleeding duodenal ulcers.  Patient denies any vomiting.  She reports she feels slightly nauseated but has not had any hematemesis.  We will proceed with evaluation for GI bleed.  Anticipate admission.  At this time vital signs are stable without hypotension or tachycardia.  I will await return of CBC patient does not require emergent blood at this time.  Anticipate admission.   Final Clinical Impression(s) / ED Diagnoses Final diagnoses:  Lower GI bleed  Severe comorbid illness    Rx / DC Orders ED Discharge Orders     None        Charlesetta Shanks, MD 07/07/21 2329

## 2021-07-07 NOTE — ED Triage Notes (Signed)
Pt c/o rectal bleeding since Wednesday. Denies blood thinners. Pt reports she has hemorrhoids and has been straining  a lot here recently when having bowel movements.

## 2021-07-07 NOTE — ED Notes (Signed)
Dr. Ralene Bathe informed of critical hgb results

## 2021-07-07 NOTE — ED Notes (Addendum)
Pt. I-stat Chem 8 results 6.8, Rees,MD. Made aware.

## 2021-07-07 NOTE — ED Notes (Signed)
I attempted to collect labs was unsuccessful.  Patient had bruises already on her arm.

## 2021-07-08 ENCOUNTER — Other Ambulatory Visit: Payer: Self-pay

## 2021-07-08 DIAGNOSIS — I120 Hypertensive chronic kidney disease with stage 5 chronic kidney disease or end stage renal disease: Secondary | ICD-10-CM

## 2021-07-08 DIAGNOSIS — I150 Renovascular hypertension: Secondary | ICD-10-CM

## 2021-07-08 DIAGNOSIS — K644 Residual hemorrhoidal skin tags: Secondary | ICD-10-CM | POA: Diagnosis present

## 2021-07-08 DIAGNOSIS — D649 Anemia, unspecified: Secondary | ICD-10-CM | POA: Diagnosis not present

## 2021-07-08 DIAGNOSIS — Z940 Kidney transplant status: Secondary | ICD-10-CM

## 2021-07-08 DIAGNOSIS — K5731 Diverticulosis of large intestine without perforation or abscess with bleeding: Secondary | ICD-10-CM | POA: Diagnosis present

## 2021-07-08 DIAGNOSIS — D509 Iron deficiency anemia, unspecified: Secondary | ICD-10-CM | POA: Diagnosis not present

## 2021-07-08 DIAGNOSIS — K31811 Angiodysplasia of stomach and duodenum with bleeding: Secondary | ICD-10-CM | POA: Diagnosis not present

## 2021-07-08 DIAGNOSIS — N185 Chronic kidney disease, stage 5: Secondary | ICD-10-CM

## 2021-07-08 DIAGNOSIS — K449 Diaphragmatic hernia without obstruction or gangrene: Secondary | ICD-10-CM | POA: Diagnosis present

## 2021-07-08 DIAGNOSIS — T8619 Other complication of kidney transplant: Secondary | ICD-10-CM | POA: Diagnosis present

## 2021-07-08 DIAGNOSIS — I420 Dilated cardiomyopathy: Secondary | ICD-10-CM | POA: Diagnosis present

## 2021-07-08 DIAGNOSIS — K922 Gastrointestinal hemorrhage, unspecified: Secondary | ICD-10-CM | POA: Diagnosis present

## 2021-07-08 DIAGNOSIS — N179 Acute kidney failure, unspecified: Secondary | ICD-10-CM | POA: Diagnosis present

## 2021-07-08 DIAGNOSIS — E213 Hyperparathyroidism, unspecified: Secondary | ICD-10-CM | POA: Diagnosis present

## 2021-07-08 DIAGNOSIS — I5032 Chronic diastolic (congestive) heart failure: Secondary | ICD-10-CM | POA: Diagnosis present

## 2021-07-08 DIAGNOSIS — I132 Hypertensive heart and chronic kidney disease with heart failure and with stage 5 chronic kidney disease, or end stage renal disease: Secondary | ICD-10-CM | POA: Diagnosis present

## 2021-07-08 DIAGNOSIS — K121 Other forms of stomatitis: Secondary | ICD-10-CM | POA: Diagnosis not present

## 2021-07-08 DIAGNOSIS — R195 Other fecal abnormalities: Secondary | ICD-10-CM | POA: Diagnosis not present

## 2021-07-08 DIAGNOSIS — K2081 Other esophagitis with bleeding: Secondary | ICD-10-CM | POA: Diagnosis not present

## 2021-07-08 DIAGNOSIS — D631 Anemia in chronic kidney disease: Secondary | ICD-10-CM | POA: Diagnosis not present

## 2021-07-08 DIAGNOSIS — K2971 Gastritis, unspecified, with bleeding: Secondary | ICD-10-CM | POA: Diagnosis not present

## 2021-07-08 DIAGNOSIS — Z20822 Contact with and (suspected) exposure to covid-19: Secondary | ICD-10-CM | POA: Diagnosis present

## 2021-07-08 DIAGNOSIS — K2211 Ulcer of esophagus with bleeding: Secondary | ICD-10-CM | POA: Diagnosis present

## 2021-07-08 DIAGNOSIS — L89152 Pressure ulcer of sacral region, stage 2: Secondary | ICD-10-CM | POA: Diagnosis present

## 2021-07-08 DIAGNOSIS — K5520 Angiodysplasia of colon without hemorrhage: Secondary | ICD-10-CM | POA: Diagnosis present

## 2021-07-08 DIAGNOSIS — I10 Essential (primary) hypertension: Secondary | ICD-10-CM | POA: Diagnosis not present

## 2021-07-08 DIAGNOSIS — L8990 Pressure ulcer of unspecified site, unspecified stage: Secondary | ICD-10-CM | POA: Insufficient documentation

## 2021-07-08 DIAGNOSIS — G4733 Obstructive sleep apnea (adult) (pediatric): Secondary | ICD-10-CM

## 2021-07-08 DIAGNOSIS — E869 Volume depletion, unspecified: Secondary | ICD-10-CM | POA: Diagnosis not present

## 2021-07-08 DIAGNOSIS — K921 Melena: Secondary | ICD-10-CM | POA: Diagnosis not present

## 2021-07-08 DIAGNOSIS — F32A Depression, unspecified: Secondary | ICD-10-CM | POA: Diagnosis present

## 2021-07-08 DIAGNOSIS — Z8719 Personal history of other diseases of the digestive system: Secondary | ICD-10-CM | POA: Diagnosis not present

## 2021-07-08 DIAGNOSIS — Z8711 Personal history of peptic ulcer disease: Secondary | ICD-10-CM | POA: Diagnosis not present

## 2021-07-08 DIAGNOSIS — K130 Diseases of lips: Secondary | ICD-10-CM | POA: Diagnosis present

## 2021-07-08 DIAGNOSIS — L89312 Pressure ulcer of right buttock, stage 2: Secondary | ICD-10-CM | POA: Diagnosis not present

## 2021-07-08 DIAGNOSIS — K3189 Other diseases of stomach and duodenum: Secondary | ICD-10-CM | POA: Diagnosis not present

## 2021-07-08 DIAGNOSIS — R7989 Other specified abnormal findings of blood chemistry: Secondary | ICD-10-CM | POA: Diagnosis not present

## 2021-07-08 DIAGNOSIS — D62 Acute posthemorrhagic anemia: Secondary | ICD-10-CM | POA: Diagnosis present

## 2021-07-08 DIAGNOSIS — I4819 Other persistent atrial fibrillation: Secondary | ICD-10-CM | POA: Diagnosis present

## 2021-07-08 DIAGNOSIS — K31819 Angiodysplasia of stomach and duodenum without bleeding: Secondary | ICD-10-CM | POA: Diagnosis present

## 2021-07-08 DIAGNOSIS — K625 Hemorrhage of anus and rectum: Secondary | ICD-10-CM | POA: Diagnosis not present

## 2021-07-08 DIAGNOSIS — K641 Second degree hemorrhoids: Secondary | ICD-10-CM | POA: Diagnosis present

## 2021-07-08 DIAGNOSIS — J45909 Unspecified asthma, uncomplicated: Secondary | ICD-10-CM | POA: Diagnosis present

## 2021-07-08 DIAGNOSIS — I739 Peripheral vascular disease, unspecified: Secondary | ICD-10-CM | POA: Diagnosis present

## 2021-07-08 DIAGNOSIS — Y830 Surgical operation with transplant of whole organ as the cause of abnormal reaction of the patient, or of later complication, without mention of misadventure at the time of the procedure: Secondary | ICD-10-CM | POA: Diagnosis present

## 2021-07-08 DIAGNOSIS — K269 Duodenal ulcer, unspecified as acute or chronic, without hemorrhage or perforation: Secondary | ICD-10-CM | POA: Diagnosis not present

## 2021-07-08 DIAGNOSIS — K209 Esophagitis, unspecified without bleeding: Secondary | ICD-10-CM | POA: Diagnosis not present

## 2021-07-08 DIAGNOSIS — K2210 Ulcer of esophagus without bleeding: Secondary | ICD-10-CM | POA: Diagnosis not present

## 2021-07-08 LAB — HEMOGLOBIN AND HEMATOCRIT, BLOOD
HCT: 25.7 % — ABNORMAL LOW (ref 36.0–46.0)
Hemoglobin: 8.6 g/dL — ABNORMAL LOW (ref 12.0–15.0)

## 2021-07-08 LAB — CBC
HCT: 25.4 % — ABNORMAL LOW (ref 36.0–46.0)
Hemoglobin: 8.5 g/dL — ABNORMAL LOW (ref 12.0–15.0)
MCH: 27.4 pg (ref 26.0–34.0)
MCHC: 33.5 g/dL (ref 30.0–36.0)
MCV: 81.9 fL (ref 80.0–100.0)
Platelets: 162 10*3/uL (ref 150–400)
RBC: 3.1 MIL/uL — ABNORMAL LOW (ref 3.87–5.11)
RDW: 17 % — ABNORMAL HIGH (ref 11.5–15.5)
WBC: 10.3 10*3/uL (ref 4.0–10.5)
nRBC: 0 % (ref 0.0–0.2)

## 2021-07-08 LAB — PREPARE RBC (CROSSMATCH)

## 2021-07-08 MED ORDER — ENSURE ENLIVE PO LIQD: 237.0000 mL | Freq: Two times a day (BID) | ORAL | Status: DC

## 2021-07-08 MED ORDER — DULOXETINE HCL 30 MG PO CPEP
30.0000 mg | ORAL_CAPSULE | Freq: Every day | ORAL | Status: DC
  Administered 2021-07-08 – 2021-07-13 (×5): 30 mg via ORAL
  Filled 2021-07-08 (×5): qty 1

## 2021-07-08 MED ORDER — ONDANSETRON HCL 4 MG PO TABS
4.0000 mg | ORAL_TABLET | Freq: Four times a day (QID) | ORAL | Status: DC | PRN
  Administered 2021-07-09: 10:00:00 4 mg via ORAL
  Filled 2021-07-08: qty 1

## 2021-07-08 MED ORDER — ACETAMINOPHEN 650 MG RE SUPP: 650.0000 mg | Freq: Four times a day (QID) | RECTAL | Status: DC | PRN

## 2021-07-08 MED ORDER — ALBUTEROL SULFATE (2.5 MG/3ML) 0.083% IN NEBU: 2.5000 mg | INHALATION_SOLUTION | Freq: Four times a day (QID) | RESPIRATORY_TRACT | Status: DC | PRN

## 2021-07-08 MED ORDER — PREDNISONE 5 MG PO TABS
5.0000 mg | ORAL_TABLET | Freq: Every day | ORAL | Status: DC
  Administered 2021-07-08 – 2021-07-13 (×5): 5 mg via ORAL
  Filled 2021-07-08 (×5): qty 1

## 2021-07-08 MED ORDER — ACETAMINOPHEN 325 MG PO TABS
650.0000 mg | ORAL_TABLET | Freq: Four times a day (QID) | ORAL | Status: DC | PRN
  Administered 2021-07-08 – 2021-07-13 (×5): 650 mg via ORAL
  Filled 2021-07-08 (×6): qty 2

## 2021-07-08 MED ORDER — FLUTICASONE FUROATE-VILANTEROL 200-25 MCG/ACT IN AEPB
1.0000 | INHALATION_SPRAY | Freq: Every day | RESPIRATORY_TRACT | Status: DC
  Administered 2021-07-08 – 2021-07-12 (×5): 1 via RESPIRATORY_TRACT
  Filled 2021-07-08: qty 28

## 2021-07-08 MED ORDER — ACETAMINOPHEN 325 MG PO TABS: 650.0000 mg | ORAL_TABLET | Freq: Four times a day (QID) | ORAL | Status: DC | PRN

## 2021-07-08 MED ORDER — MYCOPHENOLATE SODIUM 180 MG PO TBEC
180.0000 mg | DELAYED_RELEASE_TABLET | Freq: Two times a day (BID) | ORAL | Status: DC
  Administered 2021-07-08 – 2021-07-13 (×10): 180 mg via ORAL
  Filled 2021-07-08 (×11): qty 1

## 2021-07-08 MED ORDER — TACROLIMUS 1 MG PO CAPS
1.0000 mg | ORAL_CAPSULE | Freq: Two times a day (BID) | ORAL | Status: DC
  Administered 2021-07-08 – 2021-07-13 (×10): 1 mg via ORAL
  Filled 2021-07-08 (×11): qty 1

## 2021-07-08 MED ORDER — LIP MEDEX EX OINT
TOPICAL_OINTMENT | CUTANEOUS | Status: AC
  Filled 2021-07-08: qty 7

## 2021-07-08 MED ORDER — ONDANSETRON HCL 4 MG/2ML IJ SOLN: 4.0000 mg | Freq: Four times a day (QID) | INTRAMUSCULAR | Status: DC | PRN

## 2021-07-08 NOTE — Progress Notes (Signed)
Discussed with GI Dr. Rush Landmark, possible diverticular bleed. If continue bleeding will order a tagged red cell scan.   Will follow up hgb this pm and advance diet to clears for now.

## 2021-07-08 NOTE — Progress Notes (Signed)
Initial Nutrition Assessment  DOCUMENTATION CODES:   Not applicable  INTERVENTION:   RD will add supplements once pt's diet is advanced  Recommend MVI po daily   Recommend Vitamin C 500mg  po BID  NUTRITION DIAGNOSIS:   Inadequate oral intake related to acute illness as evidenced by NPO status.  GOAL:   Patient will meet greater than or equal to 90% of their needs  MONITOR:   Diet advancement, Labs, Weight trends, Skin, I & O's  REASON FOR ASSESSMENT:   Malnutrition Screening Tool    ASSESSMENT:   75 y.o. female, with PMH of ESRD s/p renal transplant 2017 on chronic immunosuppression now CKD5, history of ICH while on warfarin 2010, persistent atrial fibrillation s/p AV nodal ablation and PPM, s/p LAA closure with watchman, DVT/PE s/p IVC filter, chronic diastolic CHF (EF 25-63% 02/9372), hypertension, history of GI bleed, asthma, depression/anxiety, and OSA who presented to the ER on 07/07/2021 with GI bleeding.  RD working remotely.  Unable to reach pt by phone. Per chart review, pt with GIB since 12/21. Pt currently NPO pending GI consult. RD will add supplements once pt's diet is advanced. Per chart, pt appears weight stable at baseline. RD will obtain nutrition related history and exam at follow-up.   Medications reviewed and include: prednisone, protonix  Labs reviewed: K 3.9 wnl, BUN >130(H), creat 4.30(H), iCa 1.11(L) Hgb 8.5(L), Hct 25.4(L)  NUTRITION - FOCUSED PHYSICAL EXAM: Unable to perform at this time   Diet Order:   Diet Order             Diet clear liquid Room service appropriate? Yes; Fluid consistency: Thin  Diet effective now                  EDUCATION NEEDS:   No education needs have been identified at this time  Skin:  Skin Assessment: Reviewed RN Assessment (ecchymosis, Stage II sacrum)  Last BM:  12/24- type 6  Height:   Ht Readings from Last 1 Encounters:  07/08/21 5\' 5"  (1.651 m)    Weight:   Wt Readings from Last 1  Encounters:  07/08/21 52.5 kg    Ideal Body Weight:  56.8 kg  BMI:  Body mass index is 19.26 kg/m.  Estimated Nutritional Needs:   Kcal:  1400-1600kcal/day  Protein:  70-80g/day  Fluid:  1.3-1.5L/day  Koleen Distance MS, RD, LDN Please refer to [facility] for RD and/or RD on-call/weekend/after hours pager

## 2021-07-08 NOTE — Progress Notes (Signed)
RT inquire about cpap again tonight. Pt states she did fine without it last night and will let the RN know if she wants to use it. At this moment pt refused. Machine standby in the room.

## 2021-07-08 NOTE — Progress Notes (Signed)
Pt doesn't want to wear cpap tonight. Machine is in the room standby. Pt states she will try it tomorrow. She is not consistent with home cpap.

## 2021-07-08 NOTE — ED Notes (Signed)
Charted at erroneous time

## 2021-07-08 NOTE — H&P (Signed)
History and Physical  Patient Name: [name]     IWL:[id number]    DOB: Nov 08, 1945    DOA: 07/07/2021 PCP: [name], NP  Patient coming from: Home  Chief Complaint: GI bleeding    HPI: [name] is a 75 y.o. female, with PMH of ESRD s/p renal transplant 2017 on chronic immunosuppression now CKD5, history of ICH while on warfarin 2010, persistent atrial fibrillation s/p AV nodal ablation and PPM, s/p LAA closure with watchman, DVT/PE s/p IVC filter, chronic diastolic CHF (EF 17-40% 02/1447), hypertension, history of GI bleed, asthma, depression/anxiety, and OSA who presented to the ER on 07/07/2021 with GI bleeding.  Patient states that starting on Wednesday, she has had episodes of passing bright red blood per rectum.  Initially presented with spotting but bleeding has been worse.  Multiple bloody bowel movements a day.  Today she felt weaker and stool was tarry.  She is not on any anticoagulation but is on aspirin.  She has not been nausea or vomit but has not had great p.o. intake.  Her outpatient GI doctor is Dr. Collene Mares.  She had a similar presentation in September 2022 and had an EGD that showed 2 duodenal ulcers.  It sounds like she receives EPO for her CKD but missed most recent appointment.  Her last hemoglobin was done on 06/14/2021 and was 10.7.    ED course: -Vitals on admission: Afebrile, heart rate 74, respiratory rate 19, blood pressure 117/70, maintaining sats on room air -Labs on initial presentation: Sodium 144, potassium 4.3, chloride 119, bicarb 18, glucose 71, BUN 118, creatinine 3.69, calcium 7.5, albumin 2.6, hemoglobin 5.8, WBC 11.5 -Imaging obtained on admission: None -In the ED the patient was given started on IV Protonix drip, 2 units PRBCs ordered, and the hospitalist service was contacted for further evaluation and management.     ROS: A complete and thorough 12 point review of systems obtained, negative listed in HPI.     Past Medical  History:  Diagnosis Date   Acute diastolic (congestive) heart failure (HCC) 08/12/2017   Acute GI bleeding 11/14/2019   Acute kidney injury superimposed on chronic kidney disease ([place]) 11/17/2017   AKI (acute kidney injury) ([place]) 11/04/2017   Anemia    Anxiety    ARF (acute renal failure) ([place]) 06/06/2011   Arthritis    "shoulders; arms; hips" (02/04/2018)   Asthma    Atrial fibrillation (HCC)    Atrial flutter ([place])    s/p aflutter [facility]   Bacteriuria, asymptomatic 11/14/2020   Benign hypertension with ESRD (end-stage renal disease) ([place])    Blood transfusion    never had a reaction to blood transfusion   Cardiomyopathy Mar 2013   Mild, EF 50-55% by Mar 2013 ECHO, diast dysfxn II   Cardiomyopathy ([place])    CHF (congestive heart failure) ([place]) 2005   CHF (congestive heart failure) ([place])    Childhood asthma    Closed fracture of right distal radius 10/18/2016   CMV (cytomegalovirus infection) ([place]) 10/03/2015   Constipation    takes Miralax daily   Constipation    Depression    takes Zoloft daily   Distal radius fracture, right    DVT of lower extremity, bilateral ([place]) 12/21/11   "they're there now; been there for 2 wks"   Eczema    End stage renal disease ([place]) 11/06/2011   ESRD (end stage renal disease) on dialysis [facility]) 09/2011   07-19-2015 had Kidney transplant at [facility]; "don't  get dialysis anymore" (02/04/2018)   Essential hypertension 07/07/2007   Qualifier: Diagnosis of  By: Garen Grams     Fractures, stress    in both feet--6 OR 7 YRS AGO--RESOLVED   Generalized edema [id number]   GI bleed 11/14/2019   Gout    doesn't require meds    HCAP (healthcare-associated pneumonia) 09/10/2011   Hearing difficulty    Hematoma of left lower leg 05/17/2020   High cholesterol    History of CVA (cerebrovascular accident) without residual deficits    History of hip replacement, total, right    History of pneumonia    Hx of Clostridium difficile infection    Hypercalcemia     09/10/11   Hyperparathyroidism ([place]) 04/07/2013   Hypertension    takes Diltiazem daily    Hypomagnesemia 08/02/2015   Hypophosphatemia 11/08/2017   ICB (intracranial bleed) ([place])    Immunosuppression ([place]) 07/25/2015   Memory changes    Morbid obesity ([place])    Nonischemic dilated cardiomyopathy ([place])    Obesity 04/07/2013   Oligouria    Oral mucositis 02/22/2018   OSA on CPAP    PAF (paroxysmal atrial fibrillation) (HCC)     HX OF CEREBRAL BLEED WHILE ON COUMADIN-SO PT NOT ON ANY BLOOD THINNERS NOW   Peripheral vascular disease ([place])    Persistent atrial fibrillation ([place]) 08/12/2017   Overview:  Added automatically from request for surgery [id number]  Formatting of this note might be different from the original. Added automatically from request for surgery [id number]   Presence of Watchman left atrial appendage closure device 10/04/2017   Proteinuria 09/04/2016   Psoriasis 08/07/2016   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/25/2015   S/P insertion of IVC (inferior vena caval) filter    Sepsis ([place]) 11/08/2017   Stress fracture    bilateral feet   Stroke ([place]) 2009   denies residual on 02/04/2018;  hemorrhagic now off coumadin   Stroke ([place]) 07/17/2007   denies residual, hemorrhagic now off coumadin   Stroke due to intracerebral hemorrhage ([place]) 2009   Suture reaction 09/04/2016   Transfusion history    Use of cane as ambulatory aid     Past Surgical History:  Procedure Laterality Date   AV FISTULA PLACEMENT  09/28/2011   Procedure: ARTERIOVENOUS (AV) FISTULA CREATION;  Surgeon: Rosetta Posner, MD;  Location: [facility] OR;  Service: Vascular;  Laterality: Left;   [place] DEFECT   CHOLECYSTECTOMY     1980's   COLONOSCOPY     COLONOSCOPY WITH PROPOFOL N/A 11/16/2019   Procedure: COLONOSCOPY WITH PROPOFOL;  Surgeon: Juanita Craver, MD;  Location: [facility] ENDOSCOPY;  Service: Endoscopy;  Laterality: N/A;   CYSTOSCOPY     many yrs ago    ESOPHAGOGASTRODUODENOSCOPY (EGD) WITH PROPOFOL N/A 11/16/2019   Procedure: ESOPHAGOGASTRODUODENOSCOPY (EGD) WITH PROPOFOL;  Surgeon: Juanita Craver, MD;  Location: [facility] ENDOSCOPY;  Service: Endoscopy;  Laterality: N/A;   ESOPHAGOGASTRODUODENOSCOPY (EGD) WITH PROPOFOL N/A 03/20/2021   Procedure: ESOPHAGOGASTRODUODENOSCOPY (EGD) WITH PROPOFOL;  Surgeon: Milus Banister, MD;  Location: [facility] ENDOSCOPY;  Service: Endoscopy;  Laterality: N/A;   FEMUR FRACTURE SURGERY Right 03/2011; 09/03/2011   "had 2, 2 wk apart in 2012; broke it again 08/2011 & had OR"   FRACTURE SURGERY     HEMOSTASIS CONTROL  03/20/2021   Procedure: HEMOSTASIS CONTROL;  Surgeon: Milus Banister, MD;  Location: [facility] ENDOSCOPY;  Service: Endoscopy;;   HOT HEMOSTASIS N/A 03/20/2021  Procedure: HOT HEMOSTASIS (ARGON PLASMA COAGULATION/BICAP);  Surgeon: Milus Banister, MD;  Location: [facility] ENDOSCOPY;  Service: Endoscopy;  Laterality: N/A;   I & D EXTREMITY  09/15/2011   Procedure: IRRIGATION AND DEBRIDEMENT EXTREMITY w REMOVAL OF HARDWARE;  Surgeon: [name], MD;  Location: [place];  Service: Orthopedics;  Laterality: Right;   INCISION AND DRAINAGE HIP  12/21/2011   Procedure: IRRIGATION AND DEBRIDEMENT HIP;  Surgeon: [name], MD;  Location: [place];  Service: Orthopedics;  Laterality: Right;  I&D RIGHT HIP WITH PLACEMENT ANTIBIOTIC SPACER   INSERTION OF DIALYSIS CATHETER     Procedure: INSERTION OF DIALYSIS CATHETER;  Surgeon: Rosetta Posner, MD;  Location: [place];  Service: Vascular;  Laterality: Right;   IRRIGATION AND DEBRIDEMENT ABSCESS  12/21/11   right hip   JOINT REPLACEMENT     KIDNEY TRANSPLANT  07/19/15   LAPAROSCOPIC GASTRIC BANDING  2008   LEFT ATRIAL APPENDAGE OCCLUSION  10/04/2017   OPEN REDUCTION INTERNAL FIXATION (ORIF) DISTAL RADIAL FRACTURE Right 10/18/2016   Procedure: OPEN REDUCTION INTERNAL FIXATION (ORIF) RIGHT DISTAL RADIAL FRACTURE;  Surgeon: Marchia Bond, MD;  Location: [place];  Service: Orthopedics;   Laterality: Right;   TOTAL HIP ARTHROPLASTY Right 02/2011   right THA 02/2011, I&D/removal of hardware 09/2011,, repeat I&D Jun 2013, reimplantation R THA 03-26-2012   TOTAL HIP REVISION  03/25/2012   Procedure: TOTAL HIP REVISION;  Surgeon: [name], MD;  Location: [facility] ORS;  Service: Orthopedics;  Laterality: Right;  Right Total Hip Reimplantation   TUBAL LIGATION     VENA CAVA FILTER PLACEMENT  11/2011    Social History: Patient lives at home.  The patient walks without assistance.  nonsmoker.  Allergies  Allergen Reactions   Warfarin Sodium Other (See Comments)    Caused her to have a stroke/left side of brain to hemorrhage    Ace Inhibitors Cough   Amiodarone Other (See Comments)    Pt with Restrictive Lung Disease by 10/2017 PFT with decreased DLCO   Amlodipine Swelling    Swelling in feet    Family history: family history includes Arthritis in her mother; Cancer in her father; Diabetes in her mother; Hodgkin's lymphoma (age of onset: 31) in an other family member; Hypertension in her brother and mother.  Prior to Admission medications   Medication Sig Start Date End Date Taking? Authorizing Provider  acetaminophen (TYLENOL) 500 MG tablet Take 500 mg by mouth every 6 (six) hours as needed for mild pain or headache.    Yes [provider]  albuterol (PROVENTIL) (2.5 MG/3ML) 0.083% nebulizer solution Take 3 mLs (2.5 mg total) by nebulization every 6 (six) hours as needed for wheezing or shortness of breath. Dx:J45.909 06/21/21  Yes [name], [name], NP  aspirin EC 81 MG tablet Take 81 mg by mouth daily. Swallow whole.   Yes [provider]  budesonide-formoterol (SYMBICORT) 160-4.5 MCG/ACT inhaler Inhale 2 puffs into the lungs 2 (two) times daily. 06/13/21  Yes [name], MD  carvedilol (COREG) 12.5 MG tablet Take 2 tablets (25 mg total) by mouth 2 (two) times daily. 11/18/19  Yes Aline August, MD  docusate sodium (COLACE) 100 MG capsule Take 100 mg by mouth  at bedtime as needed for mild constipation.   Yes [provider]  DULoxetine (CYMBALTA) 30 MG capsule TAKE 1 CAPSULE BY MOUTH EVERY DAY Patient taking differently: Take 30 mg by mouth daily. 11/03/20  Yes [name], [name], NP  guaiFENesin (MUCINEX) 600 MG 12 hr tablet Take 1 tablet (600 mg total) by mouth 2 (two) times daily. 06/16/21 06/16/22 Yes Lavina Hamman, MD  HYDROcodone bit-homatropine (HYCODAN) 5-1.5 MG/5ML syrup Take 5 mLs by mouth every 8 (eight) hours as needed for cough. 06/21/21  Yes [name], [name], NP  levofloxacin (LEVAQUIN) 500 MG tablet Take 500 mg by mouth See admin instructions. Take 500mg  by mouth every 48 hours until finished 07/03/21  Yes [provider]  mycophenolate (MYFORTIC) 180 MG EC tablet Take 180 mg by mouth 2 (two) times daily.   Yes [provider]  nystatin (MYCOSTATIN) 100000 UNIT/ML suspension Take 5 mLs (500,000 Units total) by mouth 4 (four) times daily. 07/03/21  Yes [name], [name], NP  ondansetron (ZOFRAN) 4 MG tablet Take 1 tablet (4 mg total) by mouth every 8 (eight) hours as needed for up to 9 doses for nausea. 03/25/21  Yes [name], MD  predniSONE (DELTASONE) 5 MG tablet Take 5 mg by mouth daily. 02/21/21  Yes [provider]  tacrolimus (PROGRAF) 1 MG capsule Take 1 mg by mouth 2 (two) times daily.   Yes [provider]  albuterol (VENTOLIN HFA) 108 (90 Base) MCG/ACT inhaler Inhale 1-2 puffs into the lungs every 6 (six) hours as needed for wheezing or shortness of breath. Patient not taking: Reported on 07/08/2021 06/21/21   [name], [name], NP  furosemide (LASIX) 80 MG tablet Take 1 tablet (80 mg total) by mouth daily. Patient not taking: Reported on 07/08/2021 06/21/21   Lavina Hamman, MD  potassium chloride (KLOR-CON) 10 MEQ tablet Take 10 mEq by mouth daily. Patient not taking: Reported on 07/08/2021 06/21/21   [provider]       Physical Exam: BP (!) 106/58 (BP  Location: Right Arm)    Pulse 70    Temp 98 F (36.7 C) (Oral)    Resp (!) 25    SpO2 100%   General appearance: Well-developed, adult female, alert and in no acute distress .   Eyes: Anicteric, conjunctiva pink, lids and lashes normal. PERRL.    ENT: No nasal deformity, discharge, epistaxis.  Hearing intact. OP moist without lesions.   Neck: No neck masses.  Trachea midline.  No thyromegaly/tenderness. Lymph: No cervical or supraclavicular lymphadenopathy. Skin: Warm and dry.  No jaundice.  No suspicious rashes or lesions. Cardiac: RRR, nl S1-S2, no murmurs appreciated.  No LE edema.  Radial and pedal pulses 2+ and symmetric. Respiratory: Normal respiratory rate and rhythm.  CTAB without rales or wheezes. Abdomen: Abdomen soft.  No tenderness with palpation. No ascites, distension, hepatosplenomegaly.   MSK: No deformities or effusions of the large joints of the upper or lower extremities bilaterally.  No cyanosis or clubbing. Neuro: Cranial nerves 2 through 12 grossly intact.  Sensation intact to light touch. Speech is fluent.  Marland Kitchen    Psych: Sensorium intact and responding to questions, attention normal.  Behavior appropriate.  Judgment and insight appear normal.    Labs on Admission:  I have personally reviewed following labs and imaging studies: CBC: Recent Labs  Lab 07/07/21 2245 07/07/21 2333  WBC 11.5*  --   NEUTROABS 9.2*  --   HGB 5.8* 6.8*  HCT 18.0* 20.0*  MCV 82.6  --   PLT 168  --    Basic Metabolic Panel: Recent Labs  Lab 07/07/21 1908 07/07/21 2333  NA 144 143  K 4.3 3.9  CL 116* 112*  CO2 18*  --  GLUCOSE 71 81  BUN 118* >130*  CREATININE 3.69* 4.30*  CALCIUM 7.5*  --    GFR: CrCl cannot be calculated (Unknown ideal weight.).  Liver Function Tests: Recent Labs  Lab 07/07/21 1908  AST 10*  ALT 9  ALKPHOS 43  BILITOT 0.9  PROT 5.1*  ALBUMIN 2.6*   No results for input(s): LIPASE, AMYLASE in the last 168 hours. No results for input(s): AMMONIA  in the last 168 hours. Coagulation Profile: No results for input(s): INR, PROTIME in the last 168 hours. Cardiac Enzymes: No results for input(s): CKTOTAL, CKMB, CKMBINDEX, TROPONINI in the last 168 hours. BNP (last 3 results) No results for input(s): PROBNP in the last 8760 hours. HbA1C: No results for input(s): HGBA1C in the last 72 hours. CBG: No results for input(s): GLUCAP in the last 168 hours. Lipid Profile: No results for input(s): CHOL, HDL, LDLCALC, TRIG, CHOLHDL, LDLDIRECT in the last 72 hours. Thyroid Function Tests: No results for input(s): TSH, T4TOTAL, FREET4, T3FREE, THYROIDAB in the last 72 hours. Anemia Panel: No results for input(s): VITAMINB12, FOLATE, FERRITIN, TIBC, IRON, RETICCTPCT in the last 72 hours.   Recent Results (from the past 240 hour(s))  Resp Panel by RT-PCR (Flu A&B, Covid) Nasopharyngeal Swab     Status: None   Collection Time: 07/07/21  6:19 PM   Specimen: Nasopharyngeal Swab; Nasopharyngeal(NP) swabs in vial transport medium  Result Value Ref Range Status   SARS Coronavirus 2 by RT PCR NEGATIVE NEGATIVE Final    Comment: (NOTE) SARS-CoV-2 target nucleic acids are NOT DETECTED.  The SARS-CoV-2 RNA is generally detectable in upper respiratory specimens during the acute phase of infection. The lowest concentration of SARS-CoV-2 viral copies this assay can detect is 138 copies/mL. A negative result does not preclude SARS-Cov-2 infection and should not be used as the sole basis for treatment or other patient management decisions. A negative result may occur with  improper specimen collection/handling, submission of specimen other than nasopharyngeal swab, presence of viral mutation(s) within the areas targeted by this assay, and inadequate number of viral copies(<138 copies/mL). A negative result must be combined with clinical observations, patient history, and epidemiological information. The expected result is Negative.  Fact Sheet for  Patients:  EntrepreneurPulse.com.au  Fact Sheet for Healthcare Providers:  IncredibleEmployment.be  This test is no t yet approved or cleared by the [place] FDA and  has been authorized for detection and/or diagnosis of SARS-CoV-2 by FDA under an Emergency Use Authorization (EUA). This EUA will remain  in effect (meaning this test can be used) for the duration of the COVID-19 declaration under Section 564(b)(1) of the Act, 21 U.S.C.section 360bbb-3(b)(1), unless the authorization is terminated  or revoked sooner.       Influenza A by PCR NEGATIVE NEGATIVE Final   Influenza B by PCR NEGATIVE NEGATIVE Final    Comment: (NOTE) The Xpert Xpress SARS-CoV-2/FLU/RSV plus assay is intended as an aid in the diagnosis of influenza from Nasopharyngeal swab specimens and should not be used as a sole basis for treatment. Nasal washings and aspirates are unacceptable for Xpert Xpress SARS-CoV-2/FLU/RSV testing.  Fact Sheet for Patients: EntrepreneurPulse.com.au  Fact Sheet for Healthcare Providers: IncredibleEmployment.be  This test is not yet approved or cleared by the [place] FDA and has been authorized for detection and/or diagnosis of SARS-CoV-2 by FDA under an Emergency Use Authorization (EUA). This EUA will remain in effect (meaning this test can be used) for the duration of the COVID-19 declaration under Section 564(b)(1)  of the Act, 21 U.S.C. section 360bbb-3(b)(1), unless the authorization is terminated or revoked.  Performed at [facility], [place] [address] [place], [place] [address]            Radiological Exams on Admission: Personally reviewed imaging which shows: None No results found.        Assessment/Plan   1.  Acute GI bleed, likely upper -Patient passing bright red blood, now tarry stool per rectum - Previous history of GI bleeding, most recently  September 2022 - Hemoglobin on admission 5.8 - 2 units of PRBCs ordered in the ED - Continue on PPI drip - N.p.o. - Scheduled CBC monitoring - We will plan on consulting GI in the morning  2.  Anemia, secondary to acute blood loss -See plans above  3.  CKD 5 with history of renal transplant 2017 -GFR slightly below baseline - Continue home regiment of Myfortic, prednisone, Prograf - We will check FK level 30 minutes prior to a.m. dose - Follow-up labs ordered, will monitor closely  4.  Azotemia -BUN significantly elevated on admission, secondary to GI bleed - No uremic symptoms - We will monitor closely  5.  Hypertension -Hold home antihypertensive agents due to soft blood pressure and GI bleeding  6.  Asthma -Chronic stable - Resume home regiment  7.  Depression/anxiety - Continue home Cymbalta   8.  OSA -Continue CPAP nightly    DVT prophylaxis: SCDs Code Status: Full Family Communication: None Disposition Plan: Anticipate discharge home when medically optimized Consults called: None Admission status: Progressive care unit      Medical decision making: Patient seen at 1:18 AM on 07/08/2021.  The patient was discussed with ER provider.  What exists of the patient's chart was reviewed in depth and summarized above.  Clinical condition: Watcher.        Doran Heater [facility] Please page though [employer] or Epic secure chat:  For password, contact charge nurse

## 2021-07-08 NOTE — Progress Notes (Signed)
PROGRESS NOTE    [name]  JSH:[id number] DOB: 01/04/1946 DOA: 07/07/2021 PCP: Jane Buffy, NP    Brief Narrative:  [name] was admitted to the hospital with the working diagnosis of acute blood loss anemia due to gi bleed.   75 yo female with the past medical history of ESRD sp renal transplant on 2017, CKD stage 5, history of ICH, atrial fibrillation sp ablation and PPM, sp LAA closure, DVT/PE sp IVC filter, duodenal ulcers, chronic diastolic heart failure, depression, asthma and OSA who presented with bright red blood per rectum. Reported 3 days of worsening hematochezia, associated with generalized weakness but no abdominal pain. On her initial physical examination her blood pressure was 117/70, HR 74, RR 19 and oxygen saturation 100% on room air. Lungs were clear to auscultation, heart with S1 and S2 present, abdomen soft and non tender, no lower extremity edema.   Sodium 144, potassium 4.3, chloride 116, bicarb 18, glucose 71, BUN 118, creatinine 3.69, white count 11.5, hemoglobin 5.8, hematocrit 18.0, platelets 168. SARS COVID-19 negative.  EKG 70 bpm, left axis deviation, prolonged QRS with ventricular pacing, no significant ST segment or T wave changes.  Patient received 2 units packed red blood cells and was placed on intravenous pantoprazole.  Assessment & Plan:   Active Problems:   Symptomatic anemia   Obstructive sleep apnea   Renal transplant recipient   Hypertension   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 (HCC)   Acute GI bleeding   Azotemia   Pressure injury of skin   Acute blood loss anemia due to gastrointestinal bleeding. Patient continue to have large amounts of dark blood with bowel movements, no abdominal pain, no nausea or vomiting.   Plan to continue cell count monitoring, follow up hgb after 2 units PRBC transfusion is 8,5 with hct at 25.4 Continue blood pressure monitoring Continue IV pantoprazole and NPO  for now.  Will contact GI for further evaluation.   2. AKI on CKD stage 5 renal function with serum cr at 4,30 with K at 3,9 and serum bicarbonate at 18.  BUN >130, likely related to gastrointestinal bleeding.  Continue close monitoring of renal function and electrolytes.  Avoid hypotension and nephrotoxic medications.  Continue immunosuppressive therapy with mycophenolate, prednisone and tacrolimus.   3. HTN continue blood pressure monitoring   4. Asthma no signs of acute exacerbation  Continue with breo and albuterol.   5. Depression and anxiety continue with duloxetine  6. OSA.  [place]   7. Stage 2 sacrum pressure ulcer, present on admission. Continue with local skin care.   Patient continue to be at high risk for  worsening bleeding   Status is: Inpatient  Remains inpatient appropriate because: active GI bleeding   DVT prophylaxis:  Scd   Code Status:    full  Family Communication:   No family at the bedside    Skin Documentation: Pressure Injury 07/08/21 Sacrum Stage 2 -  Partial thickness loss of dermis presenting as a shallow open injury with a red, pink wound bed without slough. (Active)  07/08/21 0358  Location: Sacrum  Location Orientation:   Staging: Stage 2 -  Partial thickness loss of dermis presenting as a shallow open injury with a red, pink wound bed without slough.  Wound Description (Comments):   Present on Admission: Yes     Consultants:  GI    Subjective: Patient continue to have dark blood with bowel movements, no dyspnea or abdominal  pain, no nausea or vomiting,   Objective: Vitals:   07/08/21 0500 07/08/21 0527 07/08/21 0824 07/08/21 0825  BP: 115/60 120/62  127/70  Pulse: 70 72  70  Resp: 16 16  (!) 22  Temp: 97.9 F (36.6 C) 98.3 F (36.8 C)  98.4 F (36.9 C)  TempSrc: Oral Oral  Oral  SpO2: 100% 100% 100% 100%  Weight:      Height:        Intake/Output Summary (Last 24 hours) at 07/08/2021 1028 Last data filed at 07/08/2021  0950 Gross per 24 hour  Intake 1045.49 ml  Output 200 ml  Net 845.49 ml   Filed Weights   07/08/21 0327  Weight: 52.5 kg    Examination:   General: Not in pain or dyspnea, deconditioned  Neurology: Awake and alert, non focal  E ENT: positive pallor, no icterus, oral mucosa moist Cardiovascular: No JVD. S1-S2 present, rhythmic, no gallops, rubs, or murmurs. No lower extremity edema. Pulmonary: positive breath sounds bilaterally, adequate air movement, no wheezing, rhonchi or rales. Gastrointestinal. Abdomen soft and non tender Skin. No rashes Musculoskeletal: no joint deformities     Data Reviewed: I have personally reviewed following labs and imaging studies  CBC: Recent Labs  Lab 07/07/21 2245 07/07/21 2333  WBC 11.5*  --   NEUTROABS 9.2*  --   HGB 5.8* 6.8*  HCT 18.0* 20.0*  MCV 82.6  --   PLT 168  --    Basic Metabolic Panel: Recent Labs  Lab 07/07/21 1908 07/07/21 2333  NA 144 143  K 4.3 3.9  CL 116* 112*  CO2 18*  --   GLUCOSE 71 81  BUN 118* >130*  CREATININE 3.69* 4.30*  CALCIUM 7.5*  --    GFR: Estimated Creatinine Clearance: 9.8 mL/min (A) (by C-G formula based on SCr of 4.3 mg/dL (H)). Liver Function Tests: Recent Labs  Lab 07/07/21 1908  AST 10*  ALT 9  ALKPHOS 43  BILITOT 0.9  PROT 5.1*  ALBUMIN 2.6*   No results for input(s): LIPASE, AMYLASE in the last 168 hours. No results for input(s): AMMONIA in the last 168 hours. Coagulation Profile: No results for input(s): INR, PROTIME in the last 168 hours. Cardiac Enzymes: No results for input(s): CKTOTAL, CKMB, CKMBINDEX, TROPONINI in the last 168 hours. BNP (last 3 results) No results for input(s): PROBNP in the last 8760 hours. HbA1C: No results for input(s): HGBA1C in the last 72 hours. CBG: No results for input(s): GLUCAP in the last 168 hours. Lipid Profile: No results for input(s): CHOL, HDL, LDLCALC, TRIG, CHOLHDL, LDLDIRECT in the last 72 hours. Thyroid Function Tests: No  results for input(s): TSH, T4TOTAL, FREET4, T3FREE, THYROIDAB in the last 72 hours. Anemia Panel: No results for input(s): VITAMINB12, FOLATE, FERRITIN, TIBC, IRON, RETICCTPCT in the last 72 hours.    Radiology Studies: I have reviewed all of the imaging during this hospital visit personally     Scheduled Meds:  DULoxetine  30 mg Oral Daily   feeding supplement  237 mL Oral BID BM   fluticasone furoate-vilanterol  1 puff Inhalation Daily   mycophenolate  180 mg Oral BID   predniSONE  5 mg Oral Daily   tacrolimus  1 mg Oral BID   Continuous Infusions:  pantoprazole 8 mg/hr (07/07/21 2200)     LOS: 0 days        Sorayah Schrodt Gerome Apley, MD

## 2021-07-09 DIAGNOSIS — R195 Other fecal abnormalities: Secondary | ICD-10-CM | POA: Diagnosis not present

## 2021-07-09 DIAGNOSIS — I150 Renovascular hypertension: Secondary | ICD-10-CM | POA: Diagnosis not present

## 2021-07-09 DIAGNOSIS — Z8711 Personal history of peptic ulcer disease: Secondary | ICD-10-CM

## 2021-07-09 DIAGNOSIS — L89312 Pressure ulcer of right buttock, stage 2: Secondary | ICD-10-CM | POA: Diagnosis not present

## 2021-07-09 DIAGNOSIS — D62 Acute posthemorrhagic anemia: Secondary | ICD-10-CM

## 2021-07-09 DIAGNOSIS — K922 Gastrointestinal hemorrhage, unspecified: Secondary | ICD-10-CM | POA: Diagnosis not present

## 2021-07-09 DIAGNOSIS — G4733 Obstructive sleep apnea (adult) (pediatric): Secondary | ICD-10-CM | POA: Diagnosis not present

## 2021-07-09 DIAGNOSIS — Z8719 Personal history of other diseases of the digestive system: Secondary | ICD-10-CM | POA: Diagnosis not present

## 2021-07-09 LAB — CBC
HCT: 24.4 % — ABNORMAL LOW (ref 36.0–46.0)
Hemoglobin: 8 g/dL — ABNORMAL LOW (ref 12.0–15.0)
MCH: 27.6 pg (ref 26.0–34.0)
MCHC: 32.8 g/dL (ref 30.0–36.0)
MCV: 84.1 fL (ref 80.0–100.0)
Platelets: 182 10*3/uL (ref 150–400)
RBC: 2.9 MIL/uL — ABNORMAL LOW (ref 3.87–5.11)
RDW: 17.7 % — ABNORMAL HIGH (ref 11.5–15.5)
WBC: 8.6 10*3/uL (ref 4.0–10.5)
nRBC: 0 % (ref 0.0–0.2)

## 2021-07-09 LAB — BASIC METABOLIC PANEL
Anion gap: 7 (ref 5–15)
BUN: 146 mg/dL — ABNORMAL HIGH (ref 8–23)
CO2: 24 mmol/L (ref 22–32)
Calcium: 8.8 mg/dL — ABNORMAL LOW (ref 8.9–10.3)
Chloride: 113 mmol/L — ABNORMAL HIGH (ref 98–111)
Creatinine, Ser: 4.13 mg/dL — ABNORMAL HIGH (ref 0.44–1.00)
GFR, Estimated: 11 mL/min — ABNORMAL LOW (ref >60)
Glucose, Bld: 94 mg/dL (ref 70–99)
Potassium: 4.3 mmol/L (ref 3.5–5.1)
Sodium: 144 mmol/L (ref 135–145)

## 2021-07-09 MED ORDER — SODIUM CHLORIDE 0.9 % IV SOLN: INTRAVENOUS | Status: DC

## 2021-07-09 NOTE — Progress Notes (Signed)
PROGRESS NOTE    [name]  SWH:[id number] DOB: 04-30-1946 DOA: 07/07/2021 PCP: [name], NP    Brief Narrative:  [name] was admitted to the hospital with the working diagnosis of acute blood loss anemia due to gi bleed, suspected diverticular in origin.    75 yo female with the past medical history of ESRD sp renal transplant on 2017, CKD stage 5, history of ICH, atrial fibrillation sp ablation and PPM, sp LAA closure, DVT/PE sp IVC filter, duodenal ulcers, chronic diastolic heart failure, depression, asthma and OSA who presented with bright red blood per rectum. Reported 3 days of worsening hematochezia, associated with generalized weakness but no abdominal pain. On her initial physical examination her blood pressure was 117/70, HR 74, RR 19 and oxygen saturation 100% on room air. Lungs were clear to auscultation, heart with S1 and S2 present, abdomen soft and non tender, no lower extremity edema.    Sodium 144, potassium 4.3, chloride 116, bicarb 18, glucose 71, BUN 118, creatinine 3.69, white count 11.5, hemoglobin 5.8, hematocrit 18.0, platelets 168. SARS COVID-19 negative.   EKG 70 bpm, left axis deviation, prolonged QRS with ventricular pacing, no significant ST segment or T wave changes.   Patient received 2 units packed red blood cells and was placed on intravenous pantoprazole.  Hgb and Hct improved with PRBC transfusion. GI was consulted for further recommendations.   Assessment & Plan:   Active Problems:   Symptomatic anemia   Obstructive sleep apnea   Renal transplant recipient   Hypertension   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 (HCC)   Acute GI bleeding   Azotemia   Pressure injury of skin     Acute blood loss anemia due to gastrointestinal bleeding.  Hematochezia has been improving.  Follow up hgb is 8,0    Continue with pantoprazole po. Advance diet to soft Follow up with GI recommendations. Not that BUN  is higher than expected for degree of renal impairment.   Ok to transfer to medical ward and discontinue telemetry    2. AKI on CKD stage 5 Renal function with serum cr at 4,13 with K at 4,3 and serum bicarbonate at 24. BUN is 146 higher than expected for degree of renal impairment.  Urine collector with dark urine  Plan to check a urine analysis. Continue to hold on IV fluids and follow up renal function and electrolytes in am,.   Immunosuppressive therapy with mycophenolate, prednisone and tacrolimus.    3. HTN  Blood pressure has been stable, will continue close monitoring.    4. Asthma  No clinical signs of acute exacerbation  On breo and albuterol.    5. Depression and anxiety c Continue with duloxetine   6. OSA.  [place]    7. Stage 2 sacrum pressure ulcer, present on admission.  On local skin care.    Patient continue to be at high risk for worsening anemia   Status is: Inpatient  Remains inpatient appropriate because: follow upon anemia        DVT prophylaxis:  Scd   Code Status:    full  Family Communication:   I spoke with patient's husband  at the bedside, we talked in detail about patient's condition, plan of care and prognosis and all questions were addressed.      Nutrition Status: Nutrition Problem: Inadequate oral intake Etiology: acute illness Signs/Symptoms: NPO status       Skin Documentation: Pressure Injury 07/08/21 Sacrum  Stage 2 -  Partial thickness loss of dermis presenting as a shallow open injury with a red, pink wound bed without slough. (Active)  07/08/21 0358  Location: Sacrum  Location Orientation:   Staging: Stage 2 -  Partial thickness loss of dermis presenting as a shallow open injury with a red, pink wound bed without slough.  Wound Description (Comments):   Present on Admission: Yes     Consultants:  GI    Subjective: Patient with no nausea or vomiting, no chest pain or dyspnea, her hematochezia has been  improving, no abdominal pain   Objective: Vitals:   07/08/21 1150 07/08/21 1500 07/08/21 1950 07/09/21 0438  BP: 123/71 116/71 137/73 134/74  Pulse: 72 75 70 70  Resp: (!) 21  16 18   Temp: 97.6 F (36.4 C)  97.9 F (36.6 C) 97.6 F (36.4 C)  TempSrc: Oral  Oral Oral  SpO2: 100% 100% 100% 100%  Weight:      Height:        Intake/Output Summary (Last 24 hours) at 07/09/2021 1054 Last data filed at 07/09/2021 1030 Gross per 24 hour  Intake 480 ml  Output 60 ml  Net 420 ml   Filed Weights   07/08/21 0327  Weight: 52.5 kg    Examination:   General: Not in pain or dyspnea, deconditioned  Neurology: Awake and alert, non focal  E ENT: no pallor, no icterus, oral mucosa moist Cardiovascular: No JVD. S1-S2 present, rhythmic, no gallops, rubs, or murmurs. No lower extremity edema. Pulmonary: positive breath sounds bilaterally, with wheezing, rhonchi or rales. Gastrointestinal. Abdomen soft and non tender Musculoskeletal: no joint deformities     Data Reviewed: I have personally reviewed following labs and imaging studies  CBC: Recent Labs  Lab 07/07/21 2245 07/07/21 2333 07/08/21 1054 07/08/21 1604 07/09/21 0546  WBC 11.5*  --  10.3  --  8.6  NEUTROABS 9.2*  --   --   --   --   HGB 5.8* 6.8* 8.5* 8.6* 8.0*  HCT 18.0* 20.0* 25.4* 25.7* 24.4*  MCV 82.6  --  81.9  --  84.1  PLT 168  --  162  --  093   Basic Metabolic Panel: Recent Labs  Lab 07/07/21 1908 07/07/21 2333 07/09/21 0546  NA 144 143 144  K 4.3 3.9 4.3  CL 116* 112* 113*  CO2 18*  --  24  GLUCOSE 71 81 94  BUN 118* >130* 146*  CREATININE 3.69* 4.30* 4.13*  CALCIUM 7.5*  --  8.8*   GFR: Estimated Creatinine Clearance: 10.2 mL/min (A) (by C-G formula based on SCr of 4.13 mg/dL (H)). Liver Function Tests: Recent Labs  Lab 07/07/21 1908  AST 10*  ALT 9  ALKPHOS 43  BILITOT 0.9  PROT 5.1*  ALBUMIN 2.6*   No results for input(s): LIPASE, AMYLASE in the last 168 hours. No results for  input(s): AMMONIA in the last 168 hours. Coagulation Profile: No results for input(s): INR, PROTIME in the last 168 hours. Cardiac Enzymes: No results for input(s): CKTOTAL, CKMB, CKMBINDEX, TROPONINI in the last 168 hours. BNP (last 3 results) No results for input(s): PROBNP in the last 8760 hours. HbA1C: No results for input(s): HGBA1C in the last 72 hours. CBG: No results for input(s): GLUCAP in the last 168 hours. Lipid Profile: No results for input(s): CHOL, HDL, LDLCALC, TRIG, CHOLHDL, LDLDIRECT in the last 72 hours. Thyroid Function Tests: No results for input(s): TSH, T4TOTAL, FREET4, T3FREE, THYROIDAB in the last  72 hours. Anemia Panel: No results for input(s): VITAMINB12, FOLATE, FERRITIN, TIBC, IRON, RETICCTPCT in the last 72 hours.    Radiology Studies: I have reviewed all of the imaging during this hospital visit personally     Scheduled Meds:  DULoxetine  30 mg Oral Daily   feeding supplement  237 mL Oral BID BM   fluticasone furoate-vilanterol  1 puff Inhalation Daily   mycophenolate  180 mg Oral BID   predniSONE  5 mg Oral Daily   tacrolimus  1 mg Oral BID   Continuous Infusions:  pantoprazole 8 mg/hr (07/09/21 0600)     LOS: 1 day        [name] Gerome Apley, MD

## 2021-07-09 NOTE — Consult Note (Signed)
[facility] Inpatient Consultation   Attending Requesting Consult Arrien, [place] Hospital Day: 3  Reason for Consult Anemia and GI Bleeding    History of Present Illness  [name] is a 75 y.o. female with a pmh significant for prior renal transplant now with CRI 5, atrial fibrillation (status post AV ablation/PPM/watchman device), prior IVC filter for DVT/PE, CHF, OSA, asthma, hypertension, diverticulosis (prior diverticular bleeding), PUD (prior duodenal ulcer disease).  The GI service is consulted for evaluation and management of anemia and recent GI bleeding.  The patient presented to the hospital in the setting of painless hematochezia.  Upon admission her hemogram showed a hemoglobin of 5.8 with an elevated BUN of 118 in the setting of her CRI 5.  She received 2 units of packed RBCs and improved her blood count to 8.5.  This was stable on repeat evaluation on 12/24 PM.  The patient has been maintained on a clear liquid diet.  She does have a history earlier this year of duodenal ulcer disease that required therapy though on that admission she had burgundy stools.  She required 7 units of blood and remained in the hospital for over a week.  It was not clear why the patient continued to have a drop in her hemogram for a week before discharge but she did not have repeat endoscopic evaluation.  We do not know what her CBC was after discharge.  Today, the patient is feeling better than on admission.  He however has had 2 bowel movements that have remained bloody with the last being a more purpleish/maroon-colored stool.  The patient is accompanied by her family today.  She feels this is more similar to her most recent evaluation in the hospital for anemia rather than one of her earlier presentations in the last few years.  The patient does not take significant nonsteroidals or BC/Goody powders other than aspirin.  The patient has not had any  abdominal discomfort with this.  GI Review of Systems Positive as above Negative for dysphagia, odynophagia, nausea, vomiting, anorexia   Review of Systems  General: Denies fevers/chills HEENT: Denies oral lesions Cardiovascular: Denies chest pain Pulmonary: Denies shortness of breath Gastroenterological: See HPI Genitourinary: Denies darkened urine or hematuria Hematological: Positive for history of easy bruising/bleeding Dermatological: Denies jaundice Psychological: Mood is anxious to get better   Histories  Past Medical History Past Medical History:  Diagnosis Date   Acute diastolic (congestive) heart failure (HCC) 08/12/2017   Acute GI bleeding 11/14/2019   Acute kidney injury superimposed on chronic kidney disease ([place]) 11/17/2017   AKI (acute kidney injury) ([place]) 11/04/2017   Anemia    Anxiety    ARF (acute renal failure) ([place]) 06/06/2011   Arthritis    "shoulders; arms; hips" (02/04/2018)   Asthma    Atrial fibrillation (HCC)    Atrial flutter ([place])    s/p aflutter ablation at [facility]   Bacteriuria, asymptomatic 11/14/2020   Benign hypertension with ESRD (end-stage renal disease) ([place])    Blood transfusion    never had a reaction to blood transfusion   Cardiomyopathy Mar 2013   Mild, EF 50-55% by Mar 2013 ECHO, diast dysfxn II   Cardiomyopathy ([place])    CHF (congestive heart failure) ([place]) 2005   CHF (congestive heart failure) ([place])    Childhood asthma    Closed fracture of right distal radius 10/18/2016   CMV (cytomegalovirus infection) ([place]) 10/03/2015   Constipation    takes Miralax  daily   Constipation    Depression    takes Zoloft daily   Distal radius fracture, right    DVT of lower extremity, bilateral ([place]) 12/21/11   "they're there now; been there for 2 wks"   Eczema    End stage renal disease ([place]) 11/06/2011   ESRD (end stage renal disease) on dialysis [facility]) 09/2011   07-19-2015 had Kidney transplant at [facility]; "don't get dialysis anymore" (02/04/2018)    Essential hypertension 07/07/2007   Qualifier: Diagnosis of  By: Garen Grams     Fractures, stress    in both feet--6 OR 7 YRS AGO--RESOLVED   Generalized edema [id number]   GI bleed 11/14/2019   Gout    doesn't require meds    HCAP (healthcare-associated pneumonia) 09/10/2011   Hearing difficulty    Hematoma of left lower leg 05/17/2020   High cholesterol    History of CVA (cerebrovascular accident) without residual deficits    History of hip replacement, total, right    History of pneumonia    Hx of Clostridium difficile infection    Hypercalcemia    09/10/11   Hyperparathyroidism ([place]) 04/07/2013   Hypertension    takes Diltiazem daily    Hypomagnesemia 08/02/2015   Hypophosphatemia 11/08/2017   ICB (intracranial bleed) ([place])    Immunosuppression ([place]) 07/25/2015   Memory changes    Morbid obesity ([place])    Nonischemic dilated cardiomyopathy ([place])    Obesity 04/07/2013   Oligouria    Oral mucositis 02/22/2018   OSA on CPAP    PAF (paroxysmal atrial fibrillation) (HCC)     HX OF CEREBRAL BLEED WHILE ON COUMADIN-SO PT NOT ON ANY BLOOD THINNERS NOW   Peripheral vascular disease ([place])    Persistent atrial fibrillation ([place]) 08/12/2017   Overview:  Added automatically from request for surgery [phone]  Formatting of this note might be different from the original. Added automatically from request for surgery [id number]   Presence of Watchman left atrial appendage closure device 10/04/2017   Proteinuria 09/04/2016   Psoriasis 08/07/2016   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/25/2015   S/P insertion of IVC (inferior vena caval) filter    Sepsis ([place]) 11/08/2017   Stress fracture    bilateral feet   Stroke ([place]) 2009   denies residual on 02/04/2018;  hemorrhagic now off coumadin   Stroke ([place]) 07/17/2007   denies residual, hemorrhagic now off coumadin   Stroke due to intracerebral hemorrhage ([place]) 2009   Suture reaction 09/04/2016   Transfusion history    Use of cane as ambulatory aid     Past Surgical History:  Procedure Laterality Date   AV FISTULA PLACEMENT  09/28/2011   Procedure: ARTERIOVENOUS (AV) FISTULA CREATION;  Surgeon: Rosetta Posner, MD;  Location: [facility] OR;  Service: Vascular;  Laterality: Left;   [place] DEFECT   CHOLECYSTECTOMY     1980's   COLONOSCOPY     COLONOSCOPY WITH PROPOFOL N/A 11/16/2019   Procedure: COLONOSCOPY WITH PROPOFOL;  Surgeon: Juanita Craver, MD;  Location: [facility] ENDOSCOPY;  Service: Endoscopy;  Laterality: N/A;   CYSTOSCOPY     many yrs ago   ESOPHAGOGASTRODUODENOSCOPY (EGD) WITH PROPOFOL N/A 11/16/2019   Procedure: ESOPHAGOGASTRODUODENOSCOPY (EGD) WITH PROPOFOL;  Surgeon: Juanita Craver, MD;  Location: [facility] ENDOSCOPY;  Service: Endoscopy;  Laterality: N/A;   ESOPHAGOGASTRODUODENOSCOPY (EGD) WITH PROPOFOL N/A 03/20/2021   Procedure: ESOPHAGOGASTRODUODENOSCOPY (EGD) WITH PROPOFOL;  Surgeon: Milus Banister,  MD;  Location: [facility] ENDOSCOPY;  Service: Endoscopy;  Laterality: N/A;   FEMUR FRACTURE SURGERY Right 03/2011; 09/03/2011   "had 2, 2 wk apart in 2012; broke it again 08/2011 & had OR"   FRACTURE SURGERY     HEMOSTASIS CONTROL  03/20/2021   Procedure: HEMOSTASIS CONTROL;  Surgeon: Milus Banister, MD;  Location: [facility] ENDOSCOPY;  Service: Endoscopy;;   HOT HEMOSTASIS N/A 03/20/2021   Procedure: HOT HEMOSTASIS (ARGON PLASMA COAGULATION/BICAP);  Surgeon: Milus Banister, MD;  Location[facility] ENDOSCOPY;  Service: Endoscopy;  Laterality: N/A;   I & D EXTREMITY  09/15/2011   Procedure: IRRIGATION AND DEBRIDEMENT EXTREMITY w REMOVAL OF HARDWARE;  Surgeon: [name], MD;  Location: [facility];  Service: Orthopedics;  Laterality: Right;   INCISION AND DRAINAGE HIP  12/21/2011   Procedure: IRRIGATION AND DEBRIDEMENT HIP;  Surgeon: [name], MD;  Location: [facility];  Service: Orthopedics;  Laterality: Right;  I&D RIGHT HIP WITH PLACEMENT ANTIBIOTIC SPACER   INSERTION OF DIALYSIS CATHETER     Procedure:  INSERTION OF DIALYSIS CATHETER;  Surgeon: Rosetta Posner, MD;  Location: [facility];  Service: Vascular;  Laterality: Right;   IRRIGATION AND DEBRIDEMENT ABSCESS  12/21/11   right hip   JOINT REPLACEMENT     KIDNEY TRANSPLANT  07/19/15   LAPAROSCOPIC GASTRIC BANDING  2008   LEFT ATRIAL APPENDAGE OCCLUSION  10/04/2017   OPEN REDUCTION INTERNAL FIXATION (ORIF) DISTAL RADIAL FRACTURE Right 10/18/2016   Procedure: OPEN REDUCTION INTERNAL FIXATION (ORIF) RIGHT DISTAL RADIAL FRACTURE;  Surgeon: Marchia Bond, MD;  Location: [facility];  Service: Orthopedics;  Laterality: Right;   TOTAL HIP ARTHROPLASTY Right 02/2011   right THA 02/2011, I&D/removal of hardware 09/2011,, repeat I&D Jun 2013, reimplantation R THA 03-26-2012   TOTAL HIP REVISION  03/25/2012   Procedure: TOTAL HIP REVISION;  Surgeon: [name], MD;  Location: [facility] ORS;  Service: Orthopedics;  Laterality: Right;  Right Total Hip Reimplantation   TUBAL LIGATION     VENA CAVA FILTER PLACEMENT  11/2011    Allergies Allergies  Allergen Reactions   Warfarin Sodium Other (See Comments)    Caused her to have a stroke/left side of brain to hemorrhage    Ace Inhibitors Cough   Amiodarone Other (See Comments)    Pt with Restrictive Lung Disease by 10/2017 PFT with decreased DLCO   Amlodipine Swelling    Swelling in feet    Family History Family History  Problem Relation Age of Onset   Cancer Father    Diabetes Mother    Hypertension Mother    Arthritis Mother    Hodgkin's lymphoma Other 3       dscd---HODGKINS DISEASE   Hypertension Brother    The patient's FH is negative for IBD/IBS/Liver Disease/GI Malignancies.  Social History Social History   Socioeconomic History   Marital status: Married    Spouse name: Not on file   Number of children: Not on file   Years of education: Not on file   Highest education level: Not on file  Occupational History   Occupation: retired  Tobacco Use   Smoking status: Never    Smokeless tobacco: Never  Vaping Use   Vaping Use: Never used  Substance and Sexual Activity   Alcohol use: Never   Drug use: Never   Sexual activity: Yes    Birth control/protection: Post-menopausal  Other Topics Concern   Not on file  Social History Narrative   Married; retired;  lives in [place] Determinants of Health   Financial Resource Strain: Not on file  Food Insecurity: Not on file  Transportation Needs: Not on file  Physical Activity: Not on file  Stress: Not on file  Social Connections: Not on file  Intimate Partner Violence: Not on file    Medications  Home Medications No current facility-administered medications on file prior to encounter.   Current Outpatient Medications on File Prior to Encounter  Medication Sig Dispense Refill   acetaminophen (TYLENOL) 500 MG tablet Take 500 mg by mouth every 6 (six) hours as needed for mild pain or headache.      albuterol (PROVENTIL) (2.5 MG/3ML) 0.083% nebulizer solution Take 3 mLs (2.5 mg total) by nebulization every 6 (six) hours as needed for wheezing or shortness of breath. Dx:J45.909 150 mL 2   aspirin EC 81 MG tablet Take 81 mg by mouth daily. Swallow whole.     budesonide-formoterol (SYMBICORT) 160-4.5 MCG/ACT inhaler Inhale 2 puffs into the lungs 2 (two) times daily. 1 each 5   carvedilol (COREG) 12.5 MG tablet Take 2 tablets (25 mg total) by mouth 2 (two) times daily.     docusate sodium (COLACE) 100 MG capsule Take 100 mg by mouth at bedtime as needed for mild constipation.     DULoxetine (CYMBALTA) 30 MG capsule TAKE 1 CAPSULE BY MOUTH EVERY DAY (Patient taking differently: Take 30 mg by mouth daily.) 90 capsule 3   guaiFENesin (MUCINEX) 600 MG 12 hr tablet Take 1 tablet (600 mg total) by mouth 2 (two) times daily. 60 tablet 2   HYDROcodone bit-homatropine (HYCODAN) 5-1.5 MG/5ML syrup Take 5 mLs by mouth every 8 (eight) hours as needed for cough. 120 mL 0   levofloxacin (LEVAQUIN) 500 MG tablet Take 500  mg by mouth See admin instructions. Take 500mg  by mouth every 48 hours until finished     mycophenolate (MYFORTIC) 180 MG EC tablet Take 180 mg by mouth 2 (two) times daily.     nystatin (MYCOSTATIN) 100000 UNIT/ML suspension Take 5 mLs (500,000 Units total) by mouth 4 (four) times daily. 120 mL 0   ondansetron (ZOFRAN) 4 MG tablet Take 1 tablet (4 mg total) by mouth every 8 (eight) hours as needed for up to 9 doses for nausea. 9 tablet 0   predniSONE (DELTASONE) 5 MG tablet Take 5 mg by mouth daily.     tacrolimus (PROGRAF) 1 MG capsule Take 1 mg by mouth 2 (two) times daily.     albuterol (VENTOLIN HFA) 108 (90 Base) MCG/ACT inhaler Inhale 1-2 puffs into the lungs every 6 (six) hours as needed for wheezing or shortness of breath. (Patient not taking: Reported on 07/08/2021) 8 g 1   furosemide (LASIX) 80 MG tablet Take 1 tablet (80 mg total) by mouth daily. (Patient not taking: Reported on 07/08/2021)     potassium chloride (KLOR-CON) 10 MEQ tablet Take 10 mEq by mouth daily. (Patient not taking: Reported on 07/08/2021)     Scheduled Inpatient Medications  DULoxetine  30 mg Oral Daily   feeding supplement  237 mL Oral BID BM   fluticasone furoate-vilanterol  1 puff Inhalation Daily   mycophenolate  180 mg Oral BID   predniSONE  5 mg Oral Daily   tacrolimus  1 mg Oral BID   Continuous Inpatient Infusions  pantoprazole 8 mg/hr (07/08/21 2034)   PRN Inpatient Medications acetaminophen **OR** acetaminophen, albuterol, ondansetron **OR** ondansetron (ZOFRAN) IV   Physical Examination  BP 134/74 (BP  Location: Right Arm)    Pulse 70    Temp 97.6 F (36.4 C) (Oral)    Resp 18    Ht 5\' 5"  (1.651 m)    Wt 52.5 kg    SpO2 100%    BMI 19.26 kg/m  GEN: NAD, appears stated age, doesn't appear chronically ill PSYCH: Cooperative, without pressured speech EYE: Conjunctivae pale-pink ENT: MMM CV: Nontachycardic RESP: No audible wheezing GI: NABS, soft, NT/ND, without rebound or guarding GU: DRE  shows hemorrhoidal disease as well as maroon stool without any lesions in the rectal vault MSK/EXT: Bilateral pedal edema SKIN: No jaundice NEURO:  Alert & Oriented x 3, no focal deficits   Review of Data  I reviewed the following data at the time of this encounter:  Laboratory Studies   Recent Labs  Lab 07/07/21 1908 07/07/21 2333  NA 144 143  K 4.3 3.9  CL 116* 112*  CO2 18*  --   BUN 118* >130*  CREATININE 3.69* 4.30*  GLUCOSE 71 81  CALCIUM 7.5*  --    Recent Labs  Lab 07/07/21 1908  AST 10*  ALT 9  ALKPHOS 43    Recent Labs  Lab 07/07/21 2245 07/07/21 2333 07/08/21 1054 07/08/21 1604  WBC 11.5*  --  10.3  --   HGB 5.8*   < > 8.5* 8.6*  HCT 18.0*   < > 25.4* 25.7*  PLT 168  --  162  --    < > = values in this interval not displayed.   No results for input(s): APTT, INR in the last 168 hours.  Imaging Studies  No new imaging studies to review  GI Procedures and Studies  September 2022 EGD - There were two ulcers in the second segment of the duodenum. One was cratered, linear, 1cm long and had a small visible vessel. The other was round, fairly superfical, without concerning features. I treated the ulcer with the visible vessel by dilute epinephrine injection and then BiCap cautery with 7Fr Gold probe. - The examination was otherwise normal.  May 2021 colonoscopy - Diverticulosis in the sigmoid colon. - The examination was otherwise normal on direct and retroflexion views.   Assessment  Ms. Howells is a 75 y.o. female with a pmh significant for prior renal transplant now with CRI 5, atrial fibrillation (status post AV ablation/PPM/watchman device), prior IVC filter for DVT/PE, CHF, OSA, asthma, hypertension, diverticulosis (prior diverticular bleeding), PUD (prior duodenal ulcer disease).  The GI service is consulted for evaluation and management of anemia and recent GI bleeding.  The patient is hemodynamically stable.  Her hemogram has been stable  overnight.  However, she has continued to have maroon-colored stool with the last episode a few hours ago.  Her exam still shows maroon stool in the vault.  This could and most likely is diverticular in origin but with the patient's recent history of ulcerations in the upper GI tract must keep this in mind as well.  I recommend that we consider an upper endoscopy and flexible sigmoidoscopy in the next 1 to 2 days.  I would like to try and do this tomorrow in the setting of her active GI bleeding although she is not emergently dying or unstable, it would be ideal to perform this tomorrow.  The main thing will be how the patient's blood counts look and we will reevaluate the patient tomorrow morning.  If the blood counts remain stable or improve then we may have time to continue to  monitor this.  If the blood counts are dropping, I do not think that we would be able to wait much longer.  The risks and benefits of endoscopic evaluation were discussed with the patient; these include but are not limited to the risk of perforation, infection, bleeding, missed lesions, lack of diagnosis, severe illness requiring hospitalization, as well as anesthesia and sedation related illnesses.  The patient and/or family is agreeable to proceed.  All patient questions were answered to the best of my ability, and the patient agrees to the aforementioned plan of action with follow-up as indicated.   Plan/Recommendations  N.p.o. at midnight Tentative EGD/flex on 12/26 - This will be pended until evaluated in the morning - If progressive hemoglobin drop like to try to pursue this if anesthesia availability on 12/26 If stable hemoglobin may wait until 12/27 Continue twice daily PPI for now   Thank you for this consult.  We will continue to follow.  Please page/call with questions or concerns.   Justice Britain, MD [facility] Advanced Endoscopy Office # [phone]

## 2021-07-09 NOTE — Progress Notes (Signed)
Pt refused CPAP qhs.  Pt encouraged to contact RT should she change her mind.  

## 2021-07-09 NOTE — Plan of Care (Signed)
°  Problem: Education: Goal: Knowledge of General Education information will improve Description: Including pain rating scale, medication(s)/side effects and non-pharmacologic comfort measures Outcome: Progressing   Problem: Health Behavior/Discharge Planning: Goal: Ability to manage health-related needs will improve Outcome: Progressing   Problem: Clinical Measurements: Goal: Ability to maintain clinical measurements within normal limits will improve Outcome: Progressing Goal: Will remain free from infection Outcome: Progressing Goal: Diagnostic test results will improve Outcome: Progressing Goal: Respiratory complications will improve Outcome: Progressing Goal: Cardiovascular complication will be avoided Outcome: Progressing   Problem: Activity: Goal: Risk for activity intolerance will decrease Outcome: Progressing   Problem: Nutrition: Goal: Adequate nutrition will be maintained Outcome: Progressing   Problem: Coping: Goal: Level of anxiety will decrease Outcome: Progressing   Problem: Pain Managment: Goal: General experience of comfort will improve Outcome: Progressing   Problem: Safety: Goal: Ability to remain free from injury will improve Outcome: Progressing   Problem: Skin Integrity: Goal: Risk for impaired skin integrity will decrease Outcome: Progressing   Problem: Education: Goal: Ability to identify signs and symptoms of gastrointestinal bleeding will improve Outcome: Progressing   Problem: Bowel/Gastric: Goal: Will show no signs and symptoms of gastrointestinal bleeding Outcome: Progressing   Problem: Fluid Volume: Goal: Will show no signs and symptoms of excessive bleeding Outcome: Progressing   Problem: Education: Goal: Ability to identify signs and symptoms of gastrointestinal bleeding will improve Outcome: Progressing   Problem: Clinical Measurements: Goal: Complications related to the disease process, condition or treatment will be  avoided or minimized Outcome: Progressing

## 2021-07-10 ENCOUNTER — Inpatient Hospital Stay (HOSPITAL_COMMUNITY): Payer: Medicare Other

## 2021-07-10 ENCOUNTER — Encounter (HOSPITAL_COMMUNITY): Payer: Self-pay | Admitting: Internal Medicine

## 2021-07-10 ENCOUNTER — Encounter (HOSPITAL_COMMUNITY)
Admission: EM | Disposition: A | Discharge status: Home Health | Payer: Self-pay | Source: Home / Self Care | Attending: Internal Medicine

## 2021-07-10 ENCOUNTER — Inpatient Hospital Stay (HOSPITAL_COMMUNITY): Payer: Medicare Other | Admitting: Anesthesiology

## 2021-07-10 DIAGNOSIS — I10 Essential (primary) hypertension: Secondary | ICD-10-CM | POA: Diagnosis not present

## 2021-07-10 DIAGNOSIS — N179 Acute kidney failure, unspecified: Secondary | ICD-10-CM

## 2021-07-10 DIAGNOSIS — K269 Duodenal ulcer, unspecified as acute or chronic, without hemorrhage or perforation: Secondary | ICD-10-CM | POA: Diagnosis not present

## 2021-07-10 DIAGNOSIS — K31819 Angiodysplasia of stomach and duodenum without bleeding: Secondary | ICD-10-CM

## 2021-07-10 DIAGNOSIS — K625 Hemorrhage of anus and rectum: Secondary | ICD-10-CM | POA: Diagnosis not present

## 2021-07-10 DIAGNOSIS — K922 Gastrointestinal hemorrhage, unspecified: Secondary | ICD-10-CM | POA: Diagnosis not present

## 2021-07-10 DIAGNOSIS — I120 Hypertensive chronic kidney disease with stage 5 chronic kidney disease or end stage renal disease: Secondary | ICD-10-CM | POA: Diagnosis not present

## 2021-07-10 DIAGNOSIS — K2210 Ulcer of esophagus without bleeding: Secondary | ICD-10-CM | POA: Diagnosis not present

## 2021-07-10 DIAGNOSIS — K2081 Other esophagitis with bleeding: Secondary | ICD-10-CM

## 2021-07-10 HISTORY — PX: ESOPHAGOGASTRODUODENOSCOPY: SHX5428

## 2021-07-10 HISTORY — PX: SUBMUCOSAL TATTOO INJECTION: SHX6856

## 2021-07-10 HISTORY — PX: BIOPSY: SHX5522

## 2021-07-10 HISTORY — PX: HOT HEMOSTASIS: SHX5433

## 2021-07-10 HISTORY — PX: FLEXIBLE SIGMOIDOSCOPY: SHX5431

## 2021-07-10 LAB — URINALYSIS, ROUTINE W REFLEX MICROSCOPIC
Bilirubin Urine: NEGATIVE
Glucose, UA: NEGATIVE mg/dL
Ketones, ur: NEGATIVE mg/dL
Nitrite: NEGATIVE
Specific Gravity, Urine: <=1.005 (ref 1.005–1.030)
pH: 6 (ref 5.0–8.0)

## 2021-07-10 LAB — OCCULT BLOOD, POC DEVICE: Fecal Occult Bld: POSITIVE — AB

## 2021-07-10 LAB — CREATININE, URINE, RANDOM: Creatinine, Urine: 50.11 mg/dL

## 2021-07-10 LAB — HEMOGLOBIN AND HEMATOCRIT, BLOOD
HCT: 29.7 % — ABNORMAL LOW (ref 36.0–46.0)
HCT: 28.7 % — ABNORMAL LOW (ref 36.0–46.0)
Hemoglobin: 9.7 g/dL — ABNORMAL LOW (ref 12.0–15.0)
Hemoglobin: 9 g/dL — ABNORMAL LOW (ref 12.0–15.0)

## 2021-07-10 LAB — CBC
HCT: 19.2 % — ABNORMAL LOW (ref 36.0–46.0)
Hemoglobin: 6.3 g/dL — CL (ref 12.0–15.0)
MCH: 27.9 pg (ref 26.0–34.0)
MCHC: 32.8 g/dL (ref 30.0–36.0)
MCV: 85 fL (ref 80.0–100.0)
Platelets: 201 10*3/uL (ref 150–400)
RBC: 2.26 MIL/uL — ABNORMAL LOW (ref 3.87–5.11)
RDW: 18.3 % — ABNORMAL HIGH (ref 11.5–15.5)
WBC: 12.8 10*3/uL — ABNORMAL HIGH (ref 4.0–10.5)
nRBC: 0 % (ref 0.0–0.2)

## 2021-07-10 LAB — BASIC METABOLIC PANEL
Anion gap: 8 (ref 5–15)
BUN: 123 mg/dL — ABNORMAL HIGH (ref 8–23)
CO2: 22 mmol/L (ref 22–32)
Calcium: 8.5 mg/dL — ABNORMAL LOW (ref 8.9–10.3)
Chloride: 115 mmol/L — ABNORMAL HIGH (ref 98–111)
Creatinine, Ser: 4.21 mg/dL — ABNORMAL HIGH (ref 0.44–1.00)
GFR, Estimated: 11 mL/min — ABNORMAL LOW (ref >60)
Glucose, Bld: 94 mg/dL (ref 70–99)
Potassium: 4.1 mmol/L (ref 3.5–5.1)
Sodium: 145 mmol/L (ref 135–145)

## 2021-07-10 LAB — SODIUM, URINE, RANDOM: Sodium, Ur: 21 mmol/L

## 2021-07-10 LAB — PREPARE RBC (CROSSMATCH)

## 2021-07-10 IMAGING — US US RENAL TRANSPLANT
1 series · 14 of 25 positions shown · non-contrast
Comparison: [DATE]

CLINICAL DATA: Acute renal failure.  History of kidney transplant.

EXAM:
ULTRASOUND OF RENAL TRANSPLANT WITH RENAL DOPPLER ULTRASOUND
TECHNIQUE: Ultrasound examination of the renal transplant was performed with
gray-scale, color and duplex doppler evaluation.

[Series 1: us renal transplant · 14 of 43 slices shown]
[im 1/43]
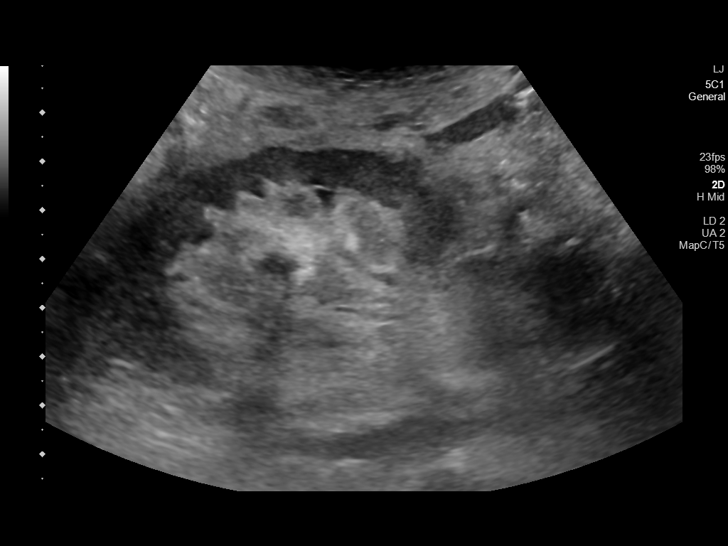
[im 4/43]
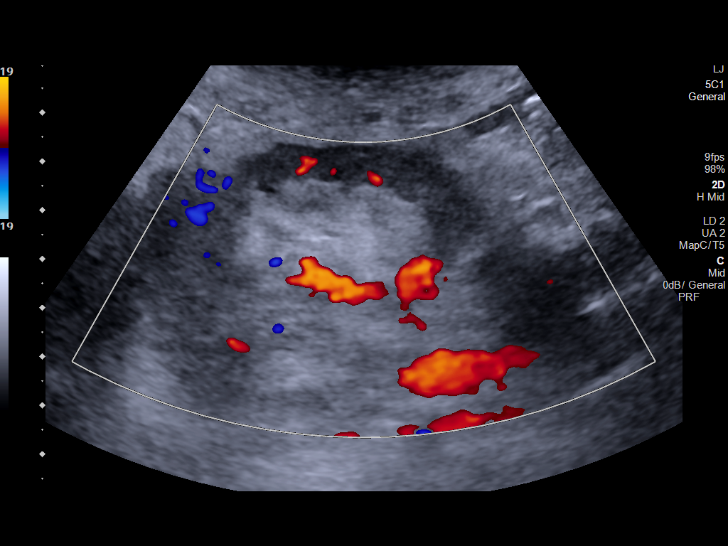
[im 8/43]
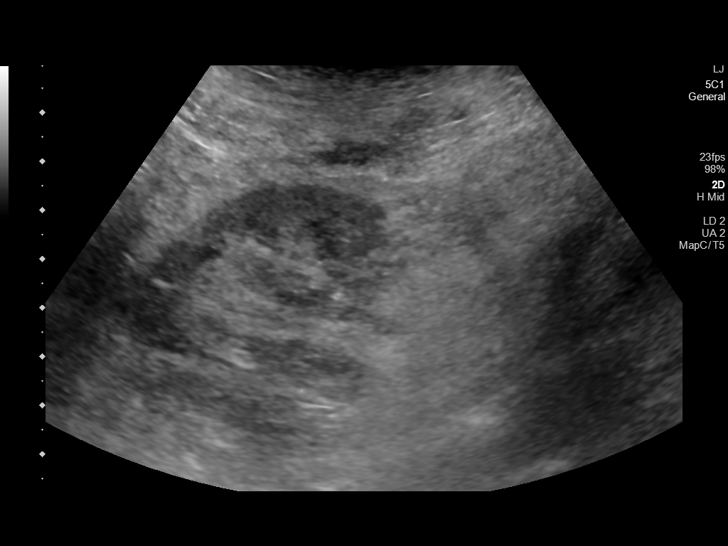
[im 11/43]
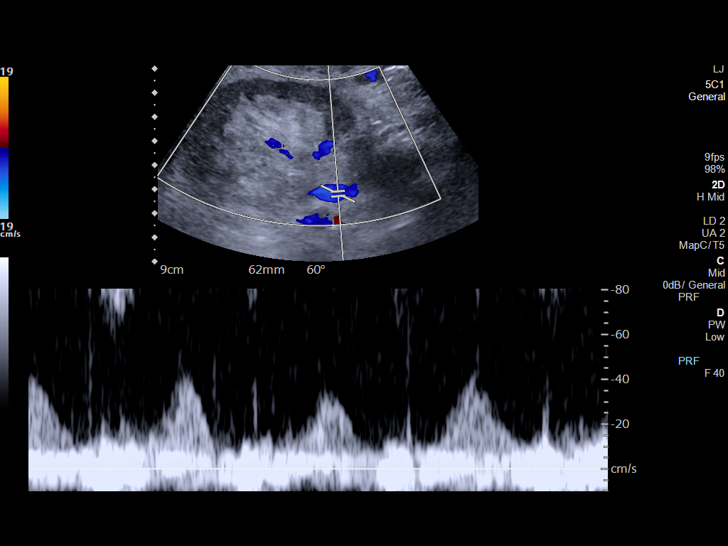
[im 15/43]
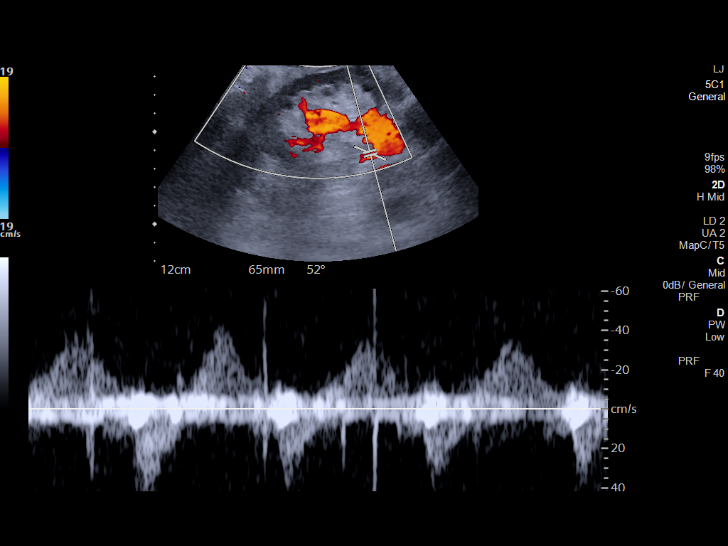
[im 16/43]
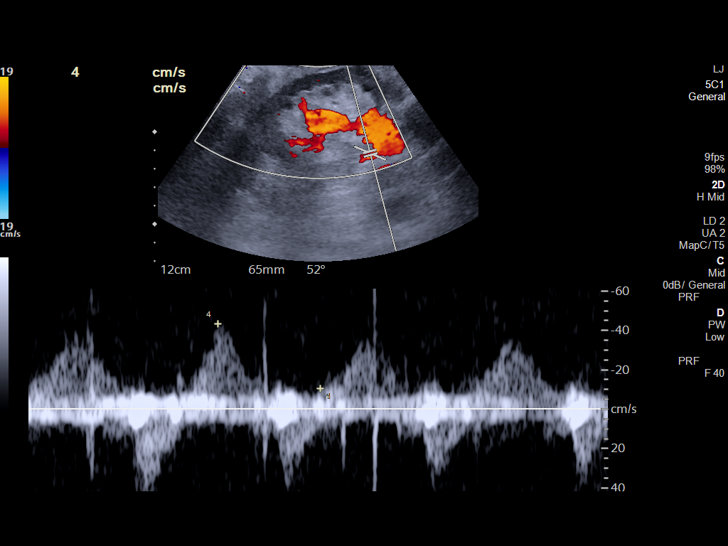
[im 20/43]
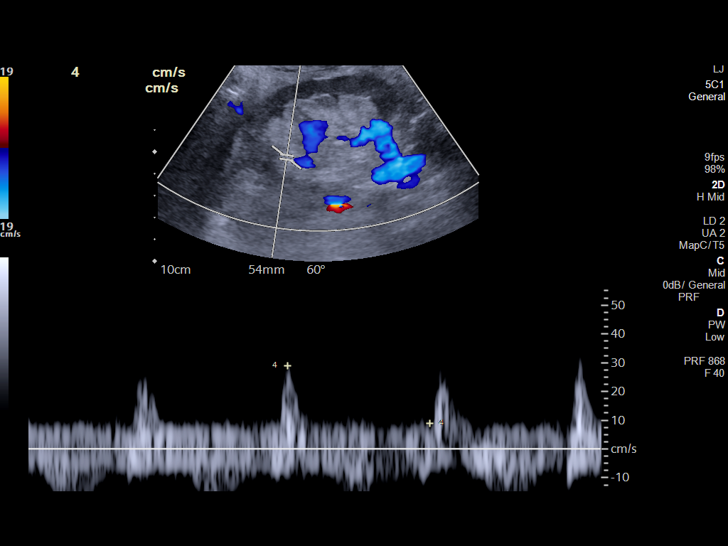
[im 23/43]
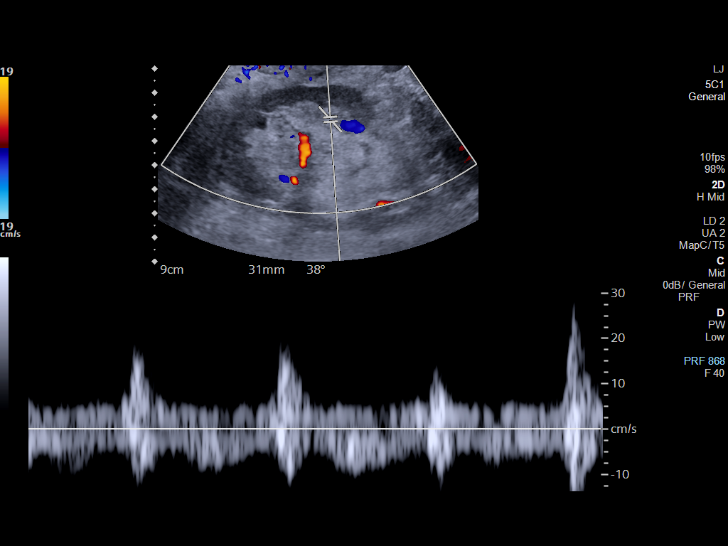
[im 27/43]
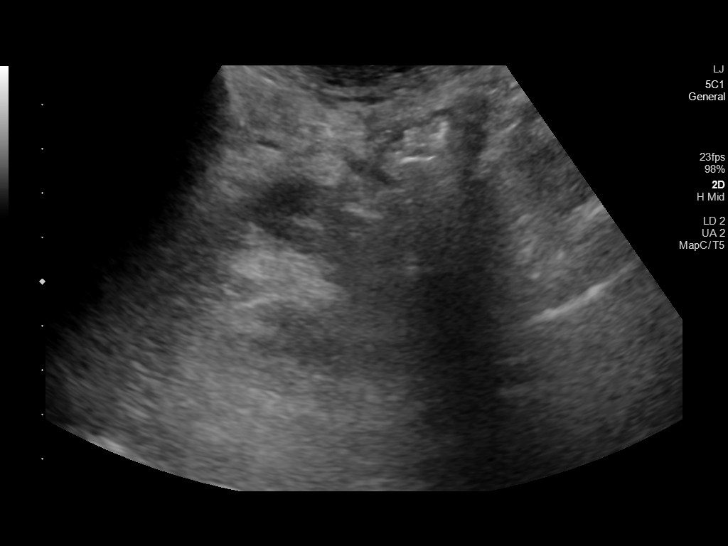
[im 29/43]
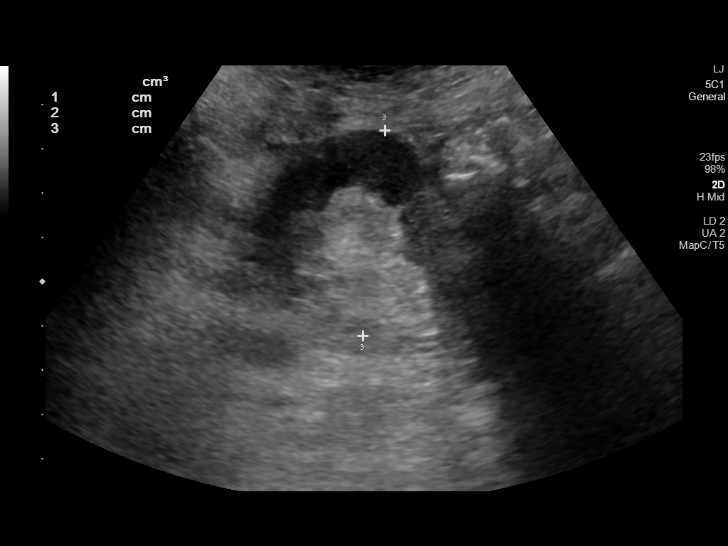
[im 32/43]
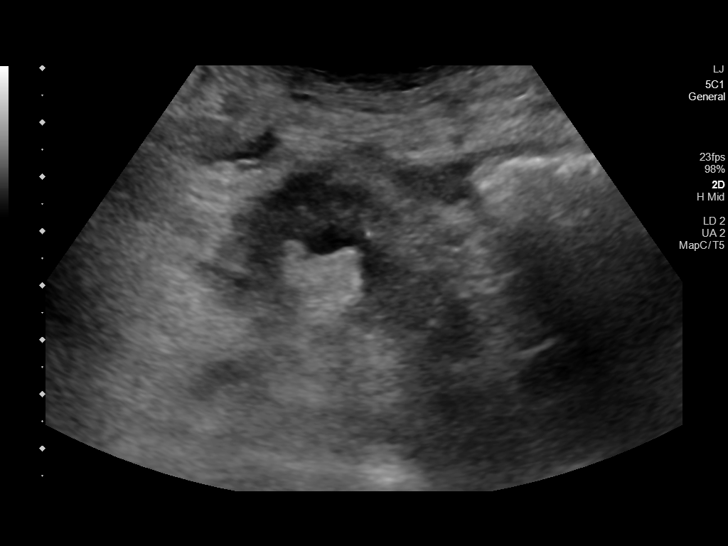
[im 36/43]
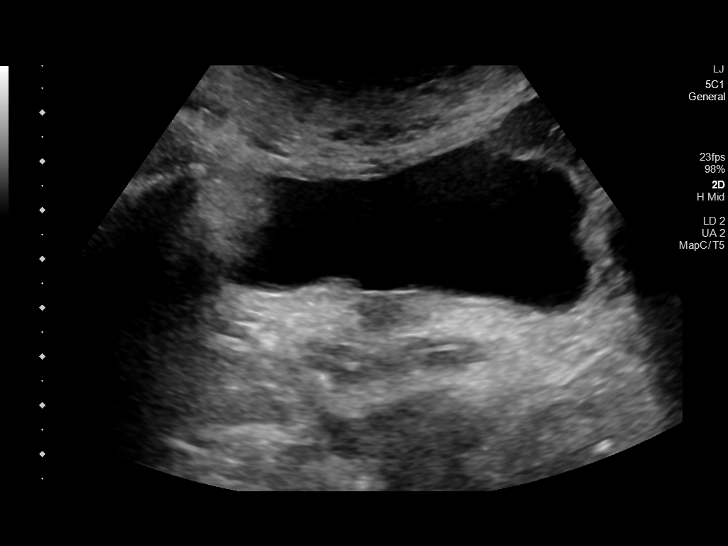
[im 39/43]
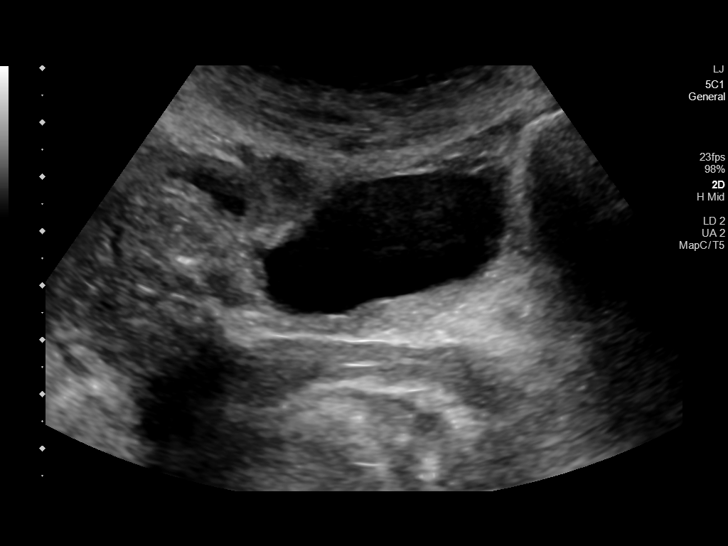
[im 43/43]
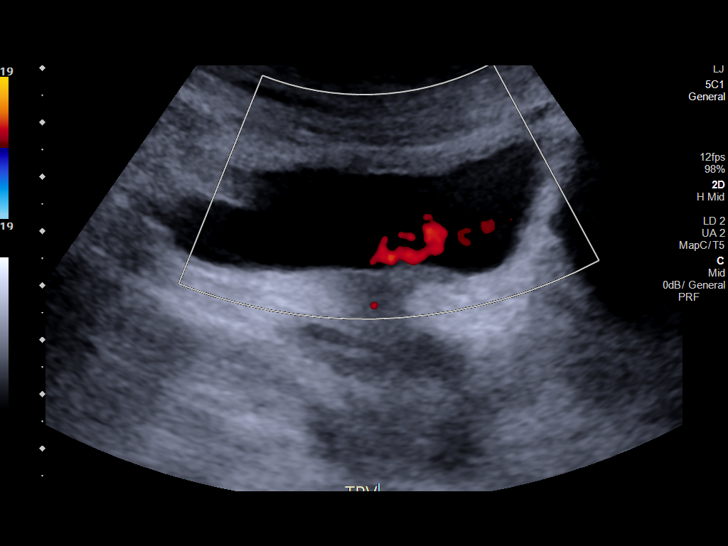

[14 of 25 positions shown; findings below may reference images not displayed]

FINDINGS: Transplant kidney location: Right lower quadrant

Transplant Kidney:

Renal measurements: 8.4 x 4.4 x 4.7 cm = volume: 90.1mL. Normal in
size and parenchymal echogenicity. No evidence of mass or
hydronephrosis. No peri-transplant fluid collection seen.

Color flow in the main renal artery:  Yes

Color flow in the main renal vein:  Yes

Duplex Doppler Evaluation:

Main Renal Artery Resistive Index:

Venous waveform in main renal vein:  present/absent

Intrarenal resistive index in upper pole:

(normal 0.6-0.8; equivocal 0.8-0.9; abnormal >= 0.9)

Intrarenal resistive index in lower pole:

(normal 0.6-0.8; equivocal 0.8-0.9; abnormal >= 0.9)

Bladder: Normal for degree of bladder distention. Ureteral jets
identified on color Doppler imaging.

Other findings:  None.
IMPRESSION: Stable, unremarkable appearance of the right lower quadrant renal
transplant.

## 2021-07-10 SURGERY — EGD (ESOPHAGOGASTRODUODENOSCOPY)
Anesthesia: Monitor Anesthesia Care

## 2021-07-10 MED ORDER — SODIUM CHLORIDE 0.9 % IV SOLN: INTRAVENOUS | Status: DC

## 2021-07-10 MED ORDER — SODIUM CHLORIDE 0.9 % IV BOLUS
1500.0000 mL | Freq: Once | INTRAVENOUS | Status: AC
  Administered 2021-07-10: 18:00:00 1500 mL via INTRAVENOUS

## 2021-07-10 MED ORDER — PROPOFOL 10 MG/ML IV BOLUS
INTRAVENOUS | Status: DC | PRN
  Administered 2021-07-10 (×2): 10 mg via INTRAVENOUS
  Administered 2021-07-10: 20 mg via INTRAVENOUS
  Administered 2021-07-10: 10 mg via INTRAVENOUS

## 2021-07-10 MED ORDER — SPOT INK MARKER SYRINGE KIT
PACK | SUBMUCOSAL | Status: AC
  Filled 2021-07-10: qty 5

## 2021-07-10 MED ORDER — PHENYLEPHRINE 40 MCG/ML (10ML) SYRINGE FOR IV PUSH (FOR BLOOD PRESSURE SUPPORT)
PREFILLED_SYRINGE | INTRAVENOUS | Status: DC | PRN
  Administered 2021-07-10 (×3): 80 ug via INTRAVENOUS
  Administered 2021-07-10: 100 ug via INTRAVENOUS

## 2021-07-10 MED ORDER — PROPOFOL 500 MG/50ML IV EMUL
INTRAVENOUS | Status: AC
  Filled 2021-07-10: qty 50

## 2021-07-10 MED ORDER — PROPOFOL 500 MG/50ML IV EMUL
INTRAVENOUS | Status: DC | PRN
  Administered 2021-07-10: 120 ug/kg/min via INTRAVENOUS

## 2021-07-10 MED ORDER — PROPOFOL 10 MG/ML IV BOLUS
INTRAVENOUS | Status: AC
  Filled 2021-07-10: qty 20

## 2021-07-10 MED ORDER — SPOT INK MARKER SYRINGE KIT
PACK | SUBMUCOSAL | Status: DC | PRN
  Administered 2021-07-10: 1 mL via SUBMUCOSAL

## 2021-07-10 MED ORDER — SUCRALFATE 1 GM/10ML PO SUSP
1.0000 g | Freq: Three times a day (TID) | ORAL | Status: DC
  Administered 2021-07-10 – 2021-07-13 (×10): 1 g via ORAL
  Filled 2021-07-10 (×9): qty 10

## 2021-07-10 MED ORDER — PEG 3350-KCL-NA BICARB-NACL 420 G PO SOLR
2000.0000 mL | Freq: Once | ORAL | Status: AC
Start: 2021-07-10 — End: 2021-07-10
  Administered 2021-07-10: 20:00:00 2000 mL via ORAL

## 2021-07-10 MED ORDER — LIDOCAINE HCL (CARDIAC) PF 100 MG/5ML IV SOSY
PREFILLED_SYRINGE | INTRAVENOUS | Status: DC | PRN
Start: 2021-07-10 — End: 2021-07-10
  Administered 2021-07-10: 80 mg via INTRAVENOUS

## 2021-07-10 MED ORDER — SODIUM CHLORIDE 0.9% IV SOLUTION
Freq: Once | INTRAVENOUS | Status: AC
  Administered 2021-07-10: 06:00:00 10 mL/h via INTRAVENOUS

## 2021-07-10 NOTE — Op Note (Signed)
[facility] Patient Name: [name] Procedure Date: 07/10/2021 MRN: [id number] Attending MD: Justice Britain , MD Date of Birth: [birth year] CSN: [id number] Age: 75 Admit Type: Inpatient Procedure:                Flexible Sigmoidoscopy Indications:              Rectal hemorrhage, , Melena, Exclusion of colon                            diverticulosis with hemorrhageMaroon stool Providers:                Justice Britain, MD, Elmer Ramp. Tilden Dome, RN, Dietitian, Technician Referring MD:             Juanita Craver, MD, [facility] Medicines:                Monitored Anesthesia Care Complications:            No immediate complications. Estimated Blood Loss:     Estimated blood loss: none. Procedure:                Pre-Anesthesia Assessment:                           - Prior to the procedure, a History and Physical                            was performed, and patient medications and                            allergies were reviewed. The patient's tolerance of                            previous anesthesia was also reviewed. The risks                            and benefits of the procedure and the sedation                            options and risks were discussed with the patient.                            All questions were answered, and informed consent                            was obtained. Prior Anticoagulants: The patient has                            taken no previous anticoagulant or antiplatelet                            agents. ASA Grade Assessment: III - A patient with  severe systemic disease. After reviewing the risks                            and benefits, the patient was deemed in                            satisfactory condition to undergo the procedure.                           After obtaining informed consent, the scope was                            passed under direct  vision. The PCF-HQ190L                            ([id number]) Olympus colonoscope was introduced                            through the anus and advanced to the the sigmoid                            colon. The flexible sigmoidoscopy was accomplished                            without difficulty. The patient tolerated the                            procedure. The quality of the bowel preparation was                            poor. Scope In: 12:42:53 PM Scope Out: 12:49:38 PM Total Procedure Duration: 0 hours 6 minutes 45 seconds  Findings:      The digital rectal exam findings include hemorrhoids. Pertinent       negatives include no palpable rectal lesions.      Clotted blood was found in the rectum, in the recto-sigmoid colon and in       the sigmoid colon. Suction via Endoscope was performed. Lavage of the       area was performed using copious amounts, resulting in incomplete       clearance with continued poor visualization.      A few small-mouthed diverticula were found in the sigmoid colon.      Non-bleeding non-thrombosed external and internal hemorrhoids were found       during retroflexion, during perianal exam and during digital exam. The       hemorrhoids were Grade II (internal hemorrhoids that prolapse but reduce       spontaneously). Impression:               - Preparation of the colon was poor.                           - Hemorrhoids found on digital rectal exam.                           - Blood in the rectum, in the recto-sigmoid colon  and in the sigmoid colon.                           - Diverticulosis in the sigmoid colon.                           - Non-bleeding non-thrombosed external and internal                            hemorrhoids. Moderate Sedation:      Not Applicable - Patient had care per Anesthesia. Recommendation:           - The patient will be observed post-procedure,                            until all discharge criteria  are met.                           - Return patient to hospital ward for ongoing care.                           - Would place patient on Full liquid diet today.                           - Monitor Hemoglobin today into tonight.                           - I expect that patient will have some maroon stool                            from what we saw today without overt extravasation.                           - I would say monitor hemoglobin and patient's                            hemodynamics for now.                           - With recent full colonoscopy within the last 1.5                            years makes this seem like diverticular bleeding is                            most likely. I will say however, since she has this                            new ulcerative disease on the upper, I do have                            concern whether there could be something else we  could be missing and with her dropping her                            hemoglobin, it is reasonable to think about this.                           - I will offer patient monitoring vs Tagged scan vs                            VCE vs VCE/Colonoscopy and then update the team.                           - Recommend, that if we have persistent bleeding                            that a Tagged RBC scan be performed.                           - If negative Tagged RBC, then would recommend                            possible VCE + Colonoscopy.                           - The findings and recommendations were discussed                            with the patient.                           - The findings and recommendations were discussed                            with the patient's family.                           - The findings and recommendations were discussed                            with the referring physician. Procedure Code(s):        --- Professional ---                            [phone], Sigmoidoscopy, flexible; diagnostic,                            including collection of specimen(s) by brushing or                            washing, when performed (separate procedure) Diagnosis Code(s):        --- Professional ---                           K64.1, Second degree hemorrhoids  K62.5, Hemorrhage of anus and rectum                           K92.2, Gastrointestinal hemorrhage, unspecified                           K92.1, Melena (includes Hematochezia)                           K57.30, Diverticulosis of large intestine without                            perforation or abscess without bleeding CPT copyright 2019 American Medical Association. All rights reserved. The codes documented in this report are preliminary and upon coder review may  be revised to meet current compliance requirements. Justice Britain, MD 07/10/2021 1:22:45 PM Number of Addenda: 0

## 2021-07-10 NOTE — H&P (View-Only) (Signed)
° ° ° °  [facility] Progress Note  CC: GI bleed, anemia  Subjective: No further burgundy hematochezia since yesterday evening.  She had mild nausea and vomited up Zofran shortly after taking it.  No hematemesis.  No further vomiting.  No upper or lower abdominal pain.  No chest pain or palpitations.  RN and husband at bedside.  Objective:  Vital signs in last 24 hours: Temp:  [97.6 F (36.4 C)-98.2 F (36.8 C)] 97.7 F (36.5 C) (12/26 0630) Pulse Rate:  [70-72] 70 (12/26 0630) Resp:  [16-18] 16 (12/26 0615) BP: (104-117)/(58-59) 106/58 (12/26 0630) SpO2:  [100 %] 100 % (12/26 0630) Last BM Date: 07/09/21 General: 75 year old female in no acute distress. Heart: Regular rate and rhythm, no murmurs Pulm: Breath sounds clear throughout Abdomen: Soft, nondistended.  Nontender.  No masses.  Possible sounds all 4 quadrants. Extremities:  Without edema. Neurologic:  Alert and  oriented x 4;  Grossly normal neurologically. Psych:  Alert and cooperative. Normal mood and affect.  Intake/Output from previous day: 12/25 0701 - 12/26 0700 In: 558.7 [P.O.:60; I.V.:498.7] Out: 60 [Emesis/NG output:60] Intake/Output this shift: No intake/output data recorded.  Lab Results: Recent Labs    07/08/21 1054 07/08/21 1604 07/09/21 0546 07/10/21 0434  WBC 10.3  --  8.6 12.8*  HGB 8.5* 8.6* 8.0* 6.3*  HCT 25.4* 25.7* 24.4* 19.2*  PLT 162  --  182 201   BMET Recent Labs    07/07/21 1908 07/07/21 2333 07/09/21 0546 07/10/21 0434  NA 144 143 144 145  K 4.3 3.9 4.3 4.1  CL 116* 112* 113* 115*  CO2 18*  --  24 22  GLUCOSE 71 81 94 94  BUN 118* >130* 146* 123*  CREATININE 3.69* 4.30* 4.13* 4.21*  CALCIUM 7.5*  --  8.8* 8.5*   LFT Recent Labs    07/07/21 1908  PROT 5.1*  ALBUMIN 2.6*  AST 10*  ALT 9  ALKPHOS 43  BILITOT 0.9   PT/INR No results for input(s): LABPROT, INR in the last 72 hours. Hepatitis Panel No results for input(s): HEPBSAG, HCVAB, HEPAIGM,  HEPBIGM in the last 72 hours.  No results found.  Assessment / Plan:  75 y.o. female with a pmh significant for prior renal transplant now with CRI 5, atrial fibrillation (status post AV ablation/PPM/watchman device), prior IVC filter for DVT/PE, CHF, OSA, asthma, hypertension, diverticulosis (prior diverticular bleeding), PUD (prior duodenal ulcer disease).  Anemia/GI bleed. Admission Hg 5.8. Transfused 2 units of PRBCs -> Hg 8.5 -> 8.6 ->Hg 8.0 -> today Hg 6.3.  Two units of PRBCs ordered, the first infusion just completed.  No further burgundy colored hematochezia since yesterday evening around 7 or 8 PM.  No abdominal pain.  She is hemodynamically stable -Check H&H posttransfusion -NPO -Patient to proceed with EGD and flexible sigmoidoscopy with Dr. Rush Landmark later today -Continue PPI infusion  -Zofran IV as needed  2) Leukocytosis, likely due to Prednisone.  She is afebrile.      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

## 2021-07-10 NOTE — Progress Notes (Addendum)
PROGRESS NOTE    [name]  QGB:[id number] DOB: November 03, 1945 DOA: 07/07/2021 PCP: [name], NP    Chief Complaint  Patient presents with   Rectal Bleeding    Brief Narrative:  [name] was admitted to the hospital with the working diagnosis of acute blood loss anemia due to gi bleed, suspected diverticular in origin.    75 yo female with the past medical history of ESRD sp renal transplant on 2017, CKD stage 5, history of ICH, atrial fibrillation sp ablation and PPM, sp LAA closure, DVT/PE sp IVC filter, duodenal ulcers, chronic diastolic heart failure, depression, asthma and OSA who presented with bright red blood per rectum. Reported 3 days of worsening hematochezia, associated with generalized weakness but no abdominal pain. On her initial physical examination her blood pressure was 117/70, HR 74, RR 19 and oxygen saturation 100% on room air. Lungs were clear to auscultation, heart with S1 and S2 present, abdomen soft and non tender, no lower extremity edema.    Sodium 144, potassium 4.3, chloride 116, bicarb 18, glucose 71, BUN 118, creatinine 3.69, white count 11.5, hemoglobin 5.8, hematocrit 18.0, platelets 168. SARS COVID-19 negative.   EKG 70 bpm, left axis deviation, prolonged QRS with ventricular pacing, no significant ST segment or T wave changes.   Patient received 2 units packed red blood cells and was placed on intravenous pantoprazole.   Hgb and Hct improved with PRBC transfusion. GI was consulted for further recommendations.  Hemoglobin dropped again to 6.8 on 07/10/2021 and assessed units packed red blood cells ordered and patient for EGD/colonoscopy today per GI.     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 -Status post transfusion 2 units packed red blood cells on presentation. -Hemoglobin dropped to 6.3 this morning and being transfused 2 units packed red blood cells. -BUN elevated higher than expected for degree of renal impairment. -Continue Protonix drip. -Patient seen in consultation by GI patient for endoscopy/flexible sigmoidoscopy today. -Follow H&H. -Transfusion threshold hemoglobin < 7.  -Per GI.  2.  Acute kidney injury on CKD stage V -Likely secondary to prerenal azotemia secondary to problem #1. -Creatinine currently at 4.21 from 3.69 on admission. -Creatinine noted at 3.25 ( 03/29/2021). -Check a UA, urine sodium, urine creatinine, transplant and renal ultrasound. -Patient being transfused 2 units packed red blood cells which are at some volume. -Check a tacrolimus level. -Continue home regimen tacrolimus, Myfortic, prednisone. -Consult with nephrology for further evaluation and management.  3.  HTN -BP soft. -Follow-up.  4.  Asthma -Stable. -Continue Breo and albuterol.  5.  Depression/anxiety -Stable. -Continue duloxetine.  6.  OSA -CPAP nightly.  7.  Stage II sacral pressure ulcer, POA -Continue current wound care. Pressure Injury 07/08/21 Sacrum Stage 2 -  Partial thickness loss of dermis presenting as a shallow open injury with a red, pink wound bed without slough. (Active)  07/08/21 0358  Location: Sacrum  Location Orientation:   Staging: Stage 2 -  Partial thickness loss of dermis presenting as a shallow open injury with a red, pink wound bed without slough.  Wound Description (Comments):   Present on Admission: Yes  DVT prophylaxis: SCDs Code Status: Full Family Communication: Updated patient and husband at bedside. Disposition:   Status is: Inpatient  Remains inpatient appropriate because: Anemia/ongoing GI bleed/severity of illness.       Consultants:   Gastroenterology: Dr. Rush Landmark 07/09/2021  Procedures:  Upper endoscopy/flexible sigmoidoscopy pending 07/10/2021 2 units packed red blood cells 07/08/2021 2 units packed red blood cells 07/10/2021  Antimicrobials: None   Subjective: Sitting up in bed, washcloth dabbing on lips which is slowly bleeding.  Denies any chest pain.  Denies any ongoing shortness of breath.  Denies any ongoing bleeding this morning.  Currently receiving transfusion of packed red blood cells.  Husband at bedside.  Objective: Vitals:   07/10/21 0615 07/10/21 0630 07/10/21 0809 07/10/21 0835  BP: (!) 104/59 (!) 106/58 108/61 107/63  Pulse: 71 70 70 70  Resp:   16 17  Temp: 97.7 F (36.5 C) 97.7 F (36.5 C) 97.6 F (36.4 C) 98.1 F (36.7 C)  TempSrc: Oral Oral Oral Oral  SpO2: 100% 100%  100%  Weight:      Height:        Intake/Output Summary (Last 24 hours) at 07/10/2021 0955 Last data filed at 07/10/2021 0809 Gross per 24 hour  Intake 918.74 ml  Output 60 ml  Net 858.74 ml   Filed Weights   07/08/21 0327  Weight: 52.5 kg    Examination:  General exam: Appears calm and comfortable  Respiratory system: Clear to auscultation bilaterally.  No wheezes, no crackles, no rhonchi.Marland Kitchen Respiratory effort normal. Cardiovascular system: S1 & S2 heard, RRR. No JVD, murmurs, rubs, gallops or clicks. No pedal edema. Gastrointestinal system: Abdomen is nondistended, soft and nontender. No organomegaly or masses felt. Normal bowel sounds heard. Central nervous system: Alert and oriented. No focal neurological deficits. Extremities: Symmetric 5 x 5 power. Skin: No rashes, lesions or ulcers Psychiatry: Judgement and insight appear normal. Mood & affect appropriate.     Data Reviewed: I have personally reviewed following labs and imaging studies  CBC: Recent Labs  Lab 07/07/21 2245 07/07/21 2333 07/08/21 1054 07/08/21 1604 07/09/21 0546 07/10/21 0434  WBC 11.5*  --  10.3  --  8.6 12.8*   NEUTROABS 9.2*  --   --   --   --   --   HGB 5.8* 6.8* 8.5* 8.6* 8.0* 6.3*  HCT 18.0* 20.0* 25.4* 25.7* 24.4* 19.2*  MCV 82.6  --  81.9  --  84.1 85.0  PLT 168  --  162  --  182 937    Basic Metabolic Panel: Recent Labs  Lab 07/07/21 1908 07/07/21 2333 07/09/21 0546 07/10/21 0434  NA 144 143 144 145  K 4.3 3.9 4.3 4.1  CL 116* 112* 113* 115*  CO2 18*  --  24 22  GLUCOSE 71 81 94 94  BUN 118* >130* 146* 123*  CREATININE 3.69* 4.30* 4.13* 4.21*  CALCIUM 7.5*  --  8.8* 8.5*    GFR: Estimated Creatinine Clearance: 10 mL/min (A) (by C-G formula based on SCr of 4.21 mg/dL (H)).  Liver Function Tests: Recent Labs  Lab 07/07/21 1908  AST 10*  ALT 9  ALKPHOS 43  BILITOT 0.9  PROT 5.1*  ALBUMIN 2.6*    CBG: No results for input(s): GLUCAP in the last 168 hours.   Recent Results (from the past 240 hour(s))  Resp Panel by RT-PCR (Flu A&B, Covid) Nasopharyngeal Swab     Status: None   Collection Time: 07/07/21  6:19 PM  Specimen: Nasopharyngeal Swab; Nasopharyngeal(NP) swabs in vial transport medium  Result Value Ref Range Status   SARS Coronavirus 2 by RT PCR NEGATIVE NEGATIVE Final    Comment: (NOTE) SARS-CoV-2 target nucleic acids are NOT DETECTED.  The SARS-CoV-2 RNA is generally detectable in upper respiratory specimens during the acute phase of infection. The lowest concentration of SARS-CoV-2 viral copies this assay can detect is 138 copies/mL. A negative result does not preclude SARS-Cov-2 infection and should not be used as the sole basis for treatment or other patient management decisions. A negative result may occur with  improper specimen collection/handling, submission of specimen other than nasopharyngeal swab, presence of viral mutation(s) within the areas targeted by this assay, and inadequate number of viral copies(<138 copies/mL). A negative result must be combined with clinical observations, patient history, and epidemiological information. The  expected result is Negative.  Fact Sheet for Patients:  EntrepreneurPulse.com.au  Fact Sheet for Healthcare Providers:  IncredibleEmployment.be  This test is no t yet approved or cleared by the [place] FDA and  has been authorized for detection and/or diagnosis of SARS-CoV-2 by FDA under an Emergency Use Authorization (EUA). This EUA will remain  in effect (meaning this test can be used) for the duration of the COVID-19 declaration under Section 564(b)(1) of the Act, 21 U.S.C.section 360bbb-3(b)(1), unless the authorization is terminated  or revoked sooner.       Influenza A by PCR NEGATIVE NEGATIVE Final   Influenza B by PCR NEGATIVE NEGATIVE Final    Comment: (NOTE) The Xpert Xpress SARS-CoV-2/FLU/RSV plus assay is intended as an aid in the diagnosis of influenza from Nasopharyngeal swab specimens and should not be used as a sole basis for treatment. Nasal washings and aspirates are unacceptable for Xpert Xpress SARS-CoV-2/FLU/RSV testing.  Fact Sheet for Patients: EntrepreneurPulse.com.au  Fact Sheet for Healthcare Providers: IncredibleEmployment.be  This test is not yet approved or cleared by the [place] FDA and has been authorized for detection and/or diagnosis of SARS-CoV-2 by FDA under an Emergency Use Authorization (EUA). This EUA will remain in effect (meaning this test can be used) for the duration of the COVID-19 declaration under Section 564(b)(1) of the Act, 21 U.S.C. section 360bbb-3(b)(1), unless the authorization is terminated or revoked.  Performed at [facility] [place] [address] [place], [place] [address]          Radiology Studies: No results found.      Scheduled Meds:  DULoxetine  30 mg Oral Daily   feeding supplement  237 mL Oral BID BM   fluticasone furoate-vilanterol  1 puff Inhalation Daily   mycophenolate  180 mg Oral BID    predniSONE  5 mg Oral Daily   tacrolimus  1 mg Oral BID   Continuous Infusions:  sodium chloride 20 mL/hr at 07/10/21 0757   pantoprazole 8 mg/hr (07/10/21 0700)     LOS: 2 days    Time spent: 40 minutes    Irine Seal, MD [facility]   To contact the attending provider between 7A-7P or the covering provider during after hours 7P-7A, please log into the web site [email or web address] and access using universal [place] password for that web site. If you do not have the password, please call the hospital operator.  07/10/2021, 9:55 AM

## 2021-07-10 NOTE — Progress Notes (Signed)
Patient refused CPAP for the night  

## 2021-07-10 NOTE — Progress Notes (Addendum)
PT Cancellation Note  Patient Details Name: SHRIYA AKER MRN: [id number] DOB: July 20, 1945   Cancelled Treatment:    Reason Eval/Treat Not Completed: Medical issues which prohibited therapy. Pt with subtherapeutic hemoglobin level, 2 units RBCs ordered per chart review. Pt also going for EGD later today. Will attempt PT eval tomorrow.    Tori Gearldene Fiorenza PT, DPT 07/10/21, 9:09 AM

## 2021-07-10 NOTE — Anesthesia Postprocedure Evaluation (Signed)
Anesthesia Post Note  Patient: [name]  Procedure(s) Performed: ESOPHAGOGASTRODUODENOSCOPY (EGD) FLEXIBLE SIGMOIDOSCOPY SUBMUCOSAL TATTOO INJECTION HOT HEMOSTASIS (ARGON PLASMA COAGULATION/BICAP) BIOPSY     Patient location during evaluation: PACU Anesthesia Type: MAC Level of consciousness: awake and alert Pain management: pain level controlled Vital Signs Assessment: post-procedure vital signs reviewed and stable Respiratory status: spontaneous breathing, nonlabored ventilation, respiratory function stable and patient connected to nasal cannula oxygen Cardiovascular status: stable and blood pressure returned to baseline Postop Assessment: no apparent nausea or vomiting Anesthetic complications: no   No notable events documented.  Last Vitals:  Vitals:   07/10/21 1330 07/10/21 1354  BP: (!) 118/56 116/65  Pulse: 70 69  Resp: 20 18  Temp:  36.6 C  SpO2: 100% 100%    Last Pain:  Vitals:   07/10/21 1354  TempSrc: Oral  PainSc: [place]

## 2021-07-10 NOTE — Op Note (Signed)
[facility] Patient Name: [name] Procedure Date: 07/10/2021 MRN: [id number] Attending MD: Justice Britain , MD Date of Birth: November 07, 1945 CSN: [id number] Age: 75 Admit Type: Inpatient Procedure:                Small bowel enteroscopy Indications:              Acute post hemorrhagic anemia, Maroon stool,                            Follow-up of duodenal ulcer, Query Upper v Lower GI                            bleeding Providers:                Justice Britain, MD, Elmer Ramp. Tilden Dome, RN, Dietitian, Technician Referring MD:             Juanita Craver, MD, [facility] Medicines:                Monitored Anesthesia Care Complications:            No immediate complications. Estimated Blood Loss:     Estimated blood loss was minimal. Procedure:                Pre-Anesthesia Assessment:                           - Prior to the procedure, a History and Physical                            was performed, and patient medications and                            allergies were reviewed. The patient's tolerance of                            previous anesthesia was also reviewed. The risks                            and benefits of the procedure and the sedation                            options and risks were discussed with the patient.                            All questions were answered, and informed consent                            was obtained. Prior Anticoagulants: The patient has                            taken no previous anticoagulant or antiplatelet  agents. ASA Grade Assessment: III - A patient with                            severe systemic disease. After reviewing the risks                            and benefits, the patient was deemed in                            satisfactory condition to undergo the procedure.                           After obtaining informed consent, the endoscope was                             passed under direct vision. Throughout the                            procedure, the patient's blood pressure, pulse, and                            oxygen saturations were monitored continuously. The                            PCF-HQ190L ([id number]) Olympus colonoscope was                            introduced through the mouth, and advanced to the                            proximal jejunum. After obtaining informed consent,                            the endoscope was passed under direct vision.                            Throughout the procedure, the patient's blood                            pressure, pulse, and oxygen saturations were                            monitored continuously.The small bowel enteroscopy                            was accomplished without difficulty. The patient                            tolerated the procedure. Scope In: Scope Out: Findings:      Many superficial esophageal ulcers were found. The largest lesion was 13       mm in largest dimension. Biopsies were taken with a cold forceps for       histology (from the central portion and edges of multiple of these  ulcers) and placed in Jar 3.      LA Grade D (one or more mucosal breaks involving at least 75% of       esophageal circumference) esophagitis with bleeding was found in the       entire esophagus. Biopsies were taken with a cold forceps for histology       and placed in Jar 3.      The Z-line was regular and was found 38 cm from the incisors.      A 3 cm hiatal hernia was present.      Patchy mildly erythematous mucosa without bleeding was found in the       entire examined stomach. Biopsies were taken with a cold forceps for       histology and Helicobacter pylori testing.      Diffuse severe mucosal changes characterized by erythema, friability       (with contact bleeding), granularity, inflammation, altered texture and       severe ulceration were found in the  duodenal bulb, in the first portion       of the duodenum, in the duodenal sweep, and in the second portion of the       duodenum. Biopsies were taken with a cold forceps for histology to rule       out CMV/PTLD.      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 Fulguration to ablate the lesion to prevent       bleeding using argon plasma was successful.      Normal mucosa was found in the fourth portion of the duodenum.      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 Fulguration to ablate the lesion to prevent bleeding       by argon plasma was successful.      Normal mucosa was found in the rest of the visualized proximal jejunum.       Area was tattooed with an injection of Spot (carbon black) to demarcate       the distal extent of today's procedure. Impression:               - Esophageal ulcers and LA Grade D erosive                            esophagitis with bleeding noted throughout.                            Biopsied to rule out CMV/HSV/PTLD.                           - Z-line regular, 38 cm from the incisors.                           - 3 cm hiatal hernia.                           - Erythematous mucosa in the stomach. Biopsied for                            HP.                           -  Mucosal changes and inflammation and severe                            ulceration in the duodenum (Bulb/D1/Sweep/D2) -                            biopsied for PTLD/CMV evaluation.                           - A single non-bleeding angiodysplastic lesion in                            the duodenum. Treated with argon plasma coagulation                            (APC).                           - Normal mucosa was found in the fourth portion of                            the duodenum.                           - A single non-bleeding angiodysplastic lesion in                            the  duodenum. Treated with argon plasma coagulation                            (APC).                           - Normal mucosa was found in the proximal jejunum                            otherwise. Tattooed distal extent. Recommendation:           - Proceed to scheduled Flexible Sigmoidoscopy.                           - May transition PPI drip to IV PPI for now.                           - Await pathology results.                           - Carafate QAC + QHS (will need pharmacy help to                            aid in taking at appropriate times).                           - The findings and recommendations were discussed  with the patient.                           - The findings and recommendations were discussed                            with the patient's family.                           - The findings and recommendations were discussed                            with the referring physician. Procedure Code(s):        --- Professional ---                           [phone], Small intestinal endoscopy, enteroscopy                            beyond second portion of duodenum, not including                            ileum; with biopsy, single or multiple                           44799, Unlisted procedure, small intestine Diagnosis Code(s):        --- Professional ---                           K22.10, Ulcer of esophagus without bleeding                           K20.81, Other esophagitis with bleeding                           K44.9, Diaphragmatic hernia without obstruction or                            gangrene                           K31.89, Other diseases of stomach and duodenum                           K31.819, Angiodysplasia of stomach and duodenum                            without bleeding                           D62, Acute posthemorrhagic anemia                           K92.1, Melena (includes Hematochezia)                           K26.9,  Duodenal ulcer, unspecified as acute or  chronic, without hemorrhage or perforation CPT copyright 2019 [employer]. All rights reserved. The codes documented in this report are preliminary and upon coder review may  be revised to meet current compliance requirements. Justice Britain, MD 07/10/2021 1:10:22 PM Number of Addenda: 0

## 2021-07-10 NOTE — Plan of Care (Signed)
  Problem: Clinical Measurements: Goal: Ability to maintain clinical measurements within normal limits will improve Outcome: Progressing   

## 2021-07-10 NOTE — Transfer of Care (Signed)
Immediate Anesthesia Transfer of Care Note  Patient: [name]  Procedure(s) Performed: ESOPHAGOGASTRODUODENOSCOPY (EGD) FLEXIBLE SIGMOIDOSCOPY SUBMUCOSAL TATTOO INJECTION HOT HEMOSTASIS (ARGON PLASMA COAGULATION/BICAP) BIOPSY  Patient Location: Endoscopy Unit  Anesthesia Type:MAC  Level of Consciousness: drowsy  Airway & Oxygen Therapy: Patient Spontanous Breathing and Patient connected to face mask oxygen  Post-op Assessment: Report given to RN and Post -op Vital signs reviewed and stable  Post vital signs: Reviewed and stable  Last Vitals:  Vitals Value Taken Time  BP 109/48 1256  Temp    Pulse 70 07/10/21 1257  Resp 26 07/10/21 1257  SpO2 100 % 07/10/21 1257  Vitals shown include unvalidated device data.  Last Pain:  Vitals:   07/10/21 1155  TempSrc: Oral  PainSc: 0-No pain      Patients Stated Pain Goal: 0 (20/94/70 9628)  Complications: No notable events documented.

## 2021-07-10 NOTE — Progress Notes (Signed)
OT Cancellation Note  Patient Details Name: [name] MRN: [id number] DOB: [birth year]   Cancelled Treatment:    Reason Eval/Treat Not Completed: Patient at procedure or test/ unavailable Patients Hbg is subtherapeutic with patient pending EGD this afternoon. OT will continue to follow Jackelyn Poling OTR/L, [facility] Office# [phone] Pager# [phone]  Marcellina Millin 07/10/2021, 10:28 AM

## 2021-07-10 NOTE — Interval H&P Note (Signed)
History and Physical Interval Note:  07/10/2021 12:10 PM  [name]  has presented today for surgery, with the diagnosis of Anemia, history of ulcer disease, maroon stools, history of diverticulosis.  The various methods of treatment have been discussed with the patient and family. After consideration of risks, benefits and other options for treatment, the patient has consented to  Procedure(s): ESOPHAGOGASTRODUODENOSCOPY (EGD) (N/A) FLEXIBLE SIGMOIDOSCOPY (N/A) as a surgical intervention.  The patient's history has been reviewed, patient examined, no change in status, stable for surgery.  I have reviewed the patient's chart and labs.  Questions were answered to the patient's satisfaction.     [employer]

## 2021-07-10 NOTE — Progress Notes (Signed)
° ° ° °  [facility] Progress Note  CC: GI bleed, anemia  Subjective: No further burgundy hematochezia since yesterday evening.  She had mild nausea and vomited up Zofran shortly after taking it.  No hematemesis.  No further vomiting.  No upper or lower abdominal pain.  No chest pain or palpitations.  RN and husband at bedside.  Objective:  Vital signs in last 24 hours: Temp:  [97.6 F (36.4 C)-98.2 F (36.8 C)] 97.7 F (36.5 C) (12/26 0630) Pulse Rate:  [70-72] 70 (12/26 0630) Resp:  [16-18] 16 (12/26 0615) BP: (104-117)/(58-59) 106/58 (12/26 0630) SpO2:  [100 %] 100 % (12/26 0630) Last BM Date: 07/09/21 General: 75 year old female in no acute distress. Heart: Regular rate and rhythm, no murmurs Pulm: Breath sounds clear throughout Abdomen: Soft, nondistended.  Nontender.  No masses.  Possible sounds all 4 quadrants. Extremities:  Without edema. Neurologic:  Alert and  oriented x 4;  Grossly normal neurologically. Psych:  Alert and cooperative. Normal mood and affect.  Intake/Output from previous day: 12/25 0701 - 12/26 0700 In: 558.7 [P.O.:60; I.V.:498.7] Out: 60 [Emesis/NG output:60] Intake/Output this shift: No intake/output data recorded.  Lab Results: Recent Labs    07/08/21 1054 07/08/21 1604 07/09/21 0546 07/10/21 0434  WBC 10.3  --  8.6 12.8*  HGB 8.5* 8.6* 8.0* 6.3*  HCT 25.4* 25.7* 24.4* 19.2*  PLT 162  --  182 201   BMET Recent Labs    07/07/21 1908 07/07/21 2333 07/09/21 0546 07/10/21 0434  NA 144 143 144 145  K 4.3 3.9 4.3 4.1  CL 116* 112* 113* 115*  CO2 18*  --  24 22  GLUCOSE 71 81 94 94  BUN 118* >130* 146* 123*  CREATININE 3.69* 4.30* 4.13* 4.21*  CALCIUM 7.5*  --  8.8* 8.5*   LFT Recent Labs    07/07/21 1908  PROT 5.1*  ALBUMIN 2.6*  AST 10*  ALT 9  ALKPHOS 43  BILITOT 0.9   PT/INR No results for input(s): LABPROT, INR in the last 72 hours. Hepatitis Panel No results for input(s): HEPBSAG, HCVAB, HEPAIGM,  HEPBIGM in the last 72 hours.  No results found.  Assessment / Plan:  75 y.o. female with a pmh significant for prior renal transplant now with CRI 5, atrial fibrillation (status post AV ablation/PPM/watchman device), prior IVC filter for DVT/PE, CHF, OSA, asthma, hypertension, diverticulosis (prior diverticular bleeding), PUD (prior duodenal ulcer disease).  Anemia/GI bleed. Admission Hg 5.8. Transfused 2 units of PRBCs -> Hg 8.5 -> 8.6 ->Hg 8.0 -> today Hg 6.3.  Two units of PRBCs ordered, the first infusion just completed.  No further burgundy colored hematochezia since yesterday evening around 7 or 8 PM.  No abdominal pain.  She is hemodynamically stable -Check H&H posttransfusion -NPO -Patient to proceed with EGD and flexible sigmoidoscopy with Dr. Rush Landmark later today -Continue PPI infusion  -Zofran IV as needed  2) Leukocytosis, likely due to Prednisone.  She is afebrile.      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

## 2021-07-10 NOTE — Anesthesia Preprocedure Evaluation (Signed)
Anesthesia Evaluation  Patient identified by MRN, date of birth, ID band Patient awake    Reviewed: Allergy & Precautions, NPO status , Patient's Chart, lab work & pertinent test results  Airway Mallampati: II  TM Distance: >3 FB Neck ROM: Full    Dental  (+) Teeth Intact, Dental Advisory Given   Pulmonary asthma , sleep apnea and Continuous Positive Airway Pressure Ventilation ,    breath sounds clear to auscultation       Cardiovascular hypertension, Pt. 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 Left ventricular ejection fraction, by estimation, is 55 to 60%. The  left ventricle has normal function. The left ventricle has no regional  wall motion abnormalities. There is mild concentric left ventricular  hypertrophy. Left ventricular diastolic  function could not be evaluated. There is the interventricular septum is  flattened in diastole ('D' shaped left ventricle), consistent with right  ventricular volume overload.  2. Right ventricular systolic function is low normal. The right  ventricular size is moderately enlarged. There is moderately elevated  pulmonary artery systolic pressure. The estimated right ventricular  systolic pressure is 41.6 mmHg.  3. Left atrial size was severely dilated.  4. Right atrial size was severely dilated.  5. The mitral valve is normal in structure. Mild to moderate mitral valve  regurgitation. No evidence of mitral stenosis.  6. 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  7. The aortic valve is normal in structure. Aortic valve regurgitation is  mild. No aortic stenosis is present.  8. The inferior vena cava is dilated in size with <50% respiratory  variability, suggesting right atrial pressure of 15 mmHg.     Neuro/Psych PSYCHIATRIC DISORDERS Anxiety Depression CVA    GI/Hepatic Neg liver ROS, PUD, GERD  ,  Endo/Other  negative endocrine ROS  Renal/GU ESRFRenal disease- s/p Renal Transplant     Musculoskeletal  (+) Arthritis ,   Abdominal Normal abdominal exam  (+)   Peds  Hematology negative hematology ROS (+)   Anesthesia Other Findings   Reproductive/Obstetrics                             Anesthesia Physical Anesthesia Plan  ASA: 3  Anesthesia Plan: MAC   Post-op Pain Management:    Induction: Intravenous  PONV Risk Score and Plan: 0 and Propofol infusion  Airway Management Planned: Natural Airway and Simple Face Mask  Additional Equipment:   Intra-op Plan:   Post-operative Plan:   Informed Consent: I have reviewed the patients History and Physical, chart, labs and discussed the procedure including the risks, benefits and alternatives for the proposed anesthesia with the patient or authorized representative who has indicated his/her understanding and acceptance.       Plan Discussed with: CRNA  Anesthesia Plan Comments:         Anesthesia Quick Evaluation

## 2021-07-10 NOTE — Consult Note (Signed)
Renal Service Consult Note [facility]  [name] 07/10/2021 Sol Blazing, MD Requesting Physician: Dr. Grandville Silos, D.   Reason for Consult: Renal failure HPI: The patient is a 75 y.o. year-old w/ hx of GIB, CKD, atrial fib, CHF/ CM, hx DVT, HTN, gout, PNA, HL, hx Cdif, hx IC bleed, hx renal transplant (2017), PAD, hx IVC filter, hx CVA presented w/ c/o BRBPR for several days off and on. Then felt weak w/ tarry stools. Takes ASA. In ED BPs was 117/70, HR 74, RR 19 afeb. Hb was 5.8, creat 3.6.  Pt was given IV PPI and prbc's x 2 were ordered. Creat ^'d to 4.3, then 4.1 yest and 4.2 today. Baseline creat is around 3.2- 3.6 from [facility] 2022. Asked to see for renal failure.    Pt seen in room, denies any CP, SOB, swelling or legs.  Feels dehydrated still.  No confusion or jerking or the extremities.   ROS - denies CP, no joint pain, no HA, no blurry vision, no rash, no diarrhea, no nausea/ vomiting, has Purewick in place    Past Medical History  Past Medical History:  Diagnosis Date   Acute diastolic (congestive) heart failure (HCC) 08/12/2017   Acute GI bleeding 11/14/2019   Acute kidney injury superimposed on chronic kidney disease ([place]) 11/17/2017   AKI (acute kidney injury) ([place]) 11/04/2017   Anemia    Anxiety    ARF (acute renal failure) ([place]) 06/06/2011   Arthritis    "shoulders; arms; hips" (02/04/2018)   Asthma    Atrial fibrillation (HCC)    Atrial flutter ([place])    s/p aflutter ablation at [facility]   Bacteriuria, asymptomatic 11/14/2020   Benign hypertension with ESRD (end-stage renal disease) ([place])    Blood transfusion    never had a reaction to blood transfusion   Cardiomyopathy Mar 2013   Mild, EF 50-55% by Mar 2013 ECHO, diast dysfxn II   Cardiomyopathy ([place])    CHF (congestive heart failure) ([place]) 2005   CHF (congestive heart failure) ([place])    Childhood asthma    Closed fracture of right distal radius 10/18/2016   CMV (cytomegalovirus infection) ([place])  10/03/2015   Constipation    takes Miralax daily   Constipation    Depression    takes Zoloft daily   Distal radius fracture, right    DVT of lower extremity, bilateral ([place]) 12/21/11   "they're there now; been there for 2 wks"   Eczema    End stage renal disease ([place]) 11/06/2011   ESRD (end stage renal disease) on dialysis [facility]) 09/2011   07-19-2015 had Kidney transplant at [facility]; "don't get dialysis anymore" (02/04/2018)   Essential hypertension 07/07/2007   Qualifier: Diagnosis of  By: Garen Grams     Fractures, stress    in both feet--6 OR 7 YRS AGO--RESOLVED   Generalized edema [id number]   GI bleed 11/14/2019   Gout    doesn't require meds    HCAP (healthcare-associated pneumonia) 09/10/2011   Hearing difficulty    Hematoma of left lower leg 05/17/2020   High cholesterol    History of CVA (cerebrovascular accident) without residual deficits    History of hip replacement, total, right    History of pneumonia    Hx of Clostridium difficile infection    Hypercalcemia    09/10/11   Hyperparathyroidism ([place]) 04/07/2013   Hypertension    takes Diltiazem daily    Hypomagnesemia 08/02/2015   Hypophosphatemia 11/08/2017   ICB (intracranial  bleed) ([place])    Immunosuppression ([place]) 07/25/2015   Memory changes    Morbid obesity ([place])    Nonischemic dilated cardiomyopathy ([name])    Obesity 04/07/2013   Oligouria    Oral mucositis 02/22/2018   OSA on CPAP    PAF (paroxysmal atrial fibrillation) (HCC)     HX OF CEREBRAL BLEED WHILE ON COUMADIN-SO PT NOT ON ANY BLOOD THINNERS NOW   Peripheral vascular disease ([place])    Persistent atrial fibrillation ([place]) 08/12/2017   Overview:  Added automatically from request for surgery [id number]  Formatting of this note might be different from the original. Added automatically from request for surgery [id number]   Presence of Watchman left atrial appendage closure device 10/04/2017   Proteinuria 09/04/2016   Psoriasis 08/07/2016   Renal transplant  recipient 07/25/2015   S/P insertion of IVC (inferior vena caval) filter    Sepsis ([place]) 11/08/2017   Stress fracture    bilateral feet   Stroke ([place]) 2009   denies residual on 02/04/2018;  hemorrhagic now off coumadin   Stroke ([place]) 07/17/2007   denies residual, hemorrhagic now off coumadin   Stroke due to intracerebral hemorrhage ([place]) 2009   Suture reaction 09/04/2016   Transfusion history    Use of cane as ambulatory aid    Past Surgical History  Past Surgical History:  Procedure Laterality Date   AV FISTULA PLACEMENT  09/28/2011   Procedure: ARTERIOVENOUS (AV) FISTULA CREATION;  Surgeon: Rosetta Posner, MD;  Location: [facility] OR;  Service: Vascular;  Laterality: Left;   Encampment DEFECT   CHOLECYSTECTOMY     1980's   COLONOSCOPY     COLONOSCOPY WITH PROPOFOL N/A 11/16/2019   Procedure: COLONOSCOPY WITH PROPOFOL;  Surgeon: Juanita Craver, MD;  Location: [facility] ENDOSCOPY;  Service: Endoscopy;  Laterality: N/A;   CYSTOSCOPY     many yrs ago   ESOPHAGOGASTRODUODENOSCOPY (EGD) WITH PROPOFOL N/A 11/16/2019   Procedure: ESOPHAGOGASTRODUODENOSCOPY (EGD) WITH PROPOFOL;  Surgeon: Juanita Craver, MD;  Location[facility] ENDOSCOPY;  Service: Endoscopy;  Laterality: N/A;   ESOPHAGOGASTRODUODENOSCOPY (EGD) WITH PROPOFOL N/A 03/20/2021   Procedure: ESOPHAGOGASTRODUODENOSCOPY (EGD) WITH PROPOFOL;  Surgeon: Milus Banister, MD;  Location: [facility] ENDOSCOPY;  Service: Endoscopy;  Laterality: N/A;   FEMUR FRACTURE SURGERY Right 03/2011; 09/03/2011   "had 2, 2 wk apart in 2012; broke it again 08/2011 & had OR"   FRACTURE SURGERY     HEMOSTASIS CONTROL  03/20/2021   Procedure: HEMOSTASIS CONTROL;  Surgeon: Milus Banister, MD;  Location: [facility] ENDOSCOPY;  Service: Endoscopy;;   HOT HEMOSTASIS N/A 03/20/2021   Procedure: HOT HEMOSTASIS (ARGON PLASMA COAGULATION/BICAP);  Surgeon: Milus Banister, MD;  Location: [facility] ENDOSCOPY;  Service: Endoscopy;  Laterality: N/A;   I & D  EXTREMITY  09/15/2011   Procedure: IRRIGATION AND DEBRIDEMENT EXTREMITY w REMOVAL OF HARDWARE;  Surgeon: [name], MD;  Location: [place];  Service: Orthopedics;  Laterality: Right;   INCISION AND DRAINAGE HIP  12/21/2011   Procedure: IRRIGATION AND DEBRIDEMENT HIP;  Surgeon: [name], MD;  Location: [place];  Service: Orthopedics;  Laterality: Right;  I&D RIGHT HIP WITH PLACEMENT ANTIBIOTIC SPACER   INSERTION OF DIALYSIS CATHETER     Procedure: INSERTION OF DIALYSIS CATHETER;  Surgeon: Rosetta Posner, MD;  Location: [place];  Service: Vascular;  Laterality: Right;   IRRIGATION AND DEBRIDEMENT ABSCESS  12/21/11   right hip  JOINT REPLACEMENT     KIDNEY TRANSPLANT  07/19/15   LAPAROSCOPIC GASTRIC BANDING  2008   LEFT ATRIAL APPENDAGE OCCLUSION  10/04/2017   OPEN REDUCTION INTERNAL FIXATION (ORIF) DISTAL RADIAL FRACTURE Right 10/18/2016   Procedure: OPEN REDUCTION INTERNAL FIXATION (ORIF) RIGHT DISTAL RADIAL FRACTURE;  Surgeon: Marchia Bond, MD;  Location: [place];  Service: Orthopedics;  Laterality: Right;   TOTAL HIP ARTHROPLASTY Right 02/2011   right THA 02/2011, I&D/removal of hardware 09/2011,, repeat I&D Jun 2013, reimplantation R THA 03-26-2012   TOTAL HIP REVISION  03/25/2012   Procedure: TOTAL HIP REVISION;  Surgeon: [name], MD;  Location: [facility] ORS;  Service: Orthopedics;  Laterality: Right;  Right Total Hip Reimplantation   TUBAL LIGATION     VENA CAVA FILTER PLACEMENT  11/2011   Family History  Family History  Problem Relation Age of Onset   Cancer Father    Diabetes Mother    Hypertension Mother    Arthritis Mother    Hodgkin's lymphoma Other 53       dscd---HODGKINS DISEASE   Hypertension Brother    Social History  reports that she has never smoked. She has never used smokeless tobacco. She reports that she does not drink alcohol and does not use drugs. Allergies  Allergies  Allergen Reactions   Warfarin Sodium Other (See Comments)    Caused her to have  a stroke/left side of brain to hemorrhage    Ace Inhibitors Cough   Amiodarone Other (See Comments)    Pt with Restrictive Lung Disease by 10/2017 PFT with decreased DLCO   Amlodipine Swelling    Swelling in feet   Home medications Prior to Admission medications   Medication Sig Start Date End Date Taking? Authorizing Provider  acetaminophen (TYLENOL) 500 MG tablet Take 500 mg by mouth every 6 (six) hours as needed for mild pain or headache.    Yes [provider]  albuterol (PROVENTIL) (2.5 MG/3ML) 0.083% nebulizer solution Take 3 mLs (2.5 mg total) by nebulization every 6 (six) hours as needed for wheezing or shortness of breath. Dx:J45.909 06/21/21  Yes [name], [name], NP  aspirin EC 81 MG tablet Take 81 mg by mouth daily. Swallow whole.   Yes [provider]  budesonide-formoterol (SYMBICORT) 160-4.5 MCG/ACT inhaler Inhale 2 puffs into the lungs 2 (two) times daily. 06/13/21  Yes [name], MD  carvedilol (COREG) 12.5 MG tablet Take 2 tablets (25 mg total) by mouth 2 (two) times daily. 11/18/19  Yes Aline August, MD  docusate sodium (COLACE) 100 MG capsule Take 100 mg by mouth at bedtime as needed for mild constipation.   Yes [provider]  DULoxetine (CYMBALTA) 30 MG capsule TAKE 1 CAPSULE BY MOUTH EVERY DAY Patient taking differently: Take 30 mg by mouth daily. 11/03/20  Yes [name], [name], NP  guaiFENesin (MUCINEX) 600 MG 12 hr tablet Take 1 tablet (600 mg total) by mouth 2 (two) times daily. 06/16/21 06/16/22 Yes Lavina Hamman, MD  HYDROcodone bit-homatropine (HYCODAN) 5-1.5 MG/5ML syrup Take 5 mLs by mouth every 8 (eight) hours as needed for cough. 06/21/21  Yes [name], [name], NP  levofloxacin (LEVAQUIN) 500 MG tablet Take 500 mg by mouth See admin instructions. Take 523m by mouth every 48 hours until finished 07/03/21  Yes [provider]  mycophenolate (MYFORTIC) 180 MG EC tablet Take 180 mg by mouth 2 (two) times daily.   Yes  [provider]  nystatin (MYCOSTATIN) 100000 UNIT/ML  suspension Take 5 mLs (500,000 Units total) by mouth 4 (four) times daily. 07/03/21  Yes [name], [name], NP  ondansetron (ZOFRAN) 4 MG tablet Take 1 tablet (4 mg total) by mouth every 8 (eight) hours as needed for up to 9 doses for nausea. 03/25/21  Yes [name], MD  predniSONE (DELTASONE) 5 MG tablet Take 5 mg by mouth daily. 02/21/21  Yes [provider]  tacrolimus (PROGRAF) 1 MG capsule Take 1 mg by mouth 2 (two) times daily.   Yes [provider]  albuterol (VENTOLIN HFA) 108 (90 Base) MCG/ACT inhaler Inhale 1-2 puffs into the lungs every 6 (six) hours as needed for wheezing or shortness of breath. Patient not taking: Reported on 07/08/2021 06/21/21   [name], [name], NP  furosemide (LASIX) 80 MG tablet Take 1 tablet (80 mg total) by mouth daily. Patient not taking: Reported on 07/08/2021 06/21/21   Lavina Hamman, MD  potassium chloride (KLOR-CON) 10 MEQ tablet Take 10 mEq by mouth daily. Patient not taking: Reported on 07/08/2021 06/21/21   [provider]     Vitals:   07/10/21 1310 07/10/21 1320 07/10/21 1330 07/10/21 1354  BP: (!) 113/54 (!) 113/53 (!) 118/56 116/65  Pulse: 70 74 70 69  Resp: (!) _0 Temp:    97.8 F (36.6 C)  TempSrc:    Oral  SpO2: 96% 97% 100% 100%  Weight:      Height:       Exam Gen alert, no distress No rash, cyanosis or gangrene Sclera anicteric, throat clear  No jvd or bruits Chest clear bilat to bases, no rales/ wheezing RRR no MRG Abd soft ntnd no mass or ascites +bs GU normal w/ purewick cath in place MS no joint effusions or deformity Ext no LE or UE edema, no wounds or ulcers Neuro is alert, Ox 3 , nf         Home meds include - asa, symbicort, coreg 25 bid, colace, cymbalta, hydocan prn, myfortic 180 bid, prednisone 5 qd, prograf 48m bid, ventolin hfa prn, lasix 80 qd, kdur 10 qd, prns/ supps / vits     UA -  pending   UNa - 21, UCr - 50    Transplant renal [place] - IMPRESSION: Stable, unremarkable appearance of the right lower quadrant renal transplant.     No hypotension, SBP's 100- 137 since admit     I/O 3.0 L in and 260 cc out     UOP 200 cc 12/24, nothing recorded 12/25 or today     Na 145  K 4.1  CO2 22 BUN 123  Cr 4.21  Hb 6.3 >. 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 Pt here w/ subacute GIB/ melena and Hb 5.8 w/ creat up 4.0- 4.5 range, higher than usual baseline.  No overt hypotension. Pt has been taking her transplant meds as directed. Looks a bit dry on exam. No acei/ARB/ contrast. UA pending. Will bolus 1.5 L NS and order IVF's at 65 cc/hr. No signs of uremia, no indication for RRT.  Will follow.  SP renal transplant-  in 2017, continue prograf / myfortic/ pred as dosed here GI bleed - sp prb'cs, per pmd and GI Anemia - d/t ABL +/- CKD HTN - holding home BP lowering meds OSA - on CPAP      Rob Sarahgrace Broman  MD 07/10/2021, 4:48 PM  Recent Labs  Lab 07/09/21 0546 07/10/21 0434 07/10/21 1434  WBC 8.6 12.8*  --   HGB 8.0* 6.3* 9.7*   Recent Labs  Lab 07/09/21 0546 07/10/21 0434  K 4.3 4.1  BUN 146* 123*  CREATININE 4.13* 4.21*  CALCIUM 8.8* 8.5*

## 2021-07-11 ENCOUNTER — Encounter (HOSPITAL_COMMUNITY)
Admission: EM | Disposition: A | Discharge status: Home Health | Payer: Self-pay | Source: Home / Self Care | Attending: Internal Medicine

## 2021-07-11 DIAGNOSIS — K922 Gastrointestinal hemorrhage, unspecified: Secondary | ICD-10-CM | POA: Diagnosis not present

## 2021-07-11 DIAGNOSIS — I120 Hypertensive chronic kidney disease with stage 5 chronic kidney disease or end stage renal disease: Secondary | ICD-10-CM | POA: Diagnosis not present

## 2021-07-11 DIAGNOSIS — N179 Acute kidney failure, unspecified: Secondary | ICD-10-CM | POA: Diagnosis not present

## 2021-07-11 DIAGNOSIS — I10 Essential (primary) hypertension: Secondary | ICD-10-CM | POA: Diagnosis not present

## 2021-07-11 HISTORY — PX: GIVENS CAPSULE STUDY: SHX5432

## 2021-07-11 LAB — TYPE AND SCREEN
ABO/RH(D): O POS
Antibody Screen: NEGATIVE
Unit division: 0
Unit division: 0
Unit division: 0
Unit division: 0

## 2021-07-11 LAB — BPAM RBC
Blood Product Expiration Date: 202301142359
Blood Product Expiration Date: 202301142359
Blood Product Expiration Date: 202301182359
Blood Product Expiration Date: 202301182359
ISSUE DATE / TIME: 202212240134
ISSUE DATE / TIME: 202212240504
ISSUE DATE / TIME: 202212260603
ISSUE DATE / TIME: 202212260816
Unit Type and Rh: 5100
Unit Type and Rh: 5100
Unit Type and Rh: 5100
Unit Type and Rh: 5100

## 2021-07-11 LAB — HEMOGLOBIN AND HEMATOCRIT, BLOOD
HCT: 27.8 % — ABNORMAL LOW (ref 36.0–46.0)
Hemoglobin: 8.5 g/dL — ABNORMAL LOW (ref 12.0–15.0)

## 2021-07-11 LAB — CBC WITH DIFFERENTIAL/PLATELET
Abs Immature Granulocytes: 0.41 10*3/uL — ABNORMAL HIGH (ref 0.00–0.07)
Basophils Absolute: 0.1 10*3/uL (ref 0.0–0.1)
Basophils Relative: 0 %
Eosinophils Absolute: 0.1 10*3/uL (ref 0.0–0.5)
Eosinophils Relative: 1 %
HCT: 26.6 % — ABNORMAL LOW (ref 36.0–46.0)
Hemoglobin: 8.5 g/dL — ABNORMAL LOW (ref 12.0–15.0)
Immature Granulocytes: 3 %
Lymphocytes Relative: 12 %
Lymphs Abs: 1.5 10*3/uL (ref 0.7–4.0)
MCH: 27.1 pg (ref 26.0–34.0)
MCHC: 32 g/dL (ref 30.0–36.0)
MCV: 84.7 fL (ref 80.0–100.0)
Monocytes Absolute: 1 10*3/uL (ref 0.1–1.0)
Monocytes Relative: 8 %
Neutro Abs: 9.4 10*3/uL — ABNORMAL HIGH (ref 1.7–7.7)
Neutrophils Relative %: 76 %
Platelets: 188 10*3/uL (ref 150–400)
RBC: 3.14 MIL/uL — ABNORMAL LOW (ref 3.87–5.11)
RDW: 20.1 % — ABNORMAL HIGH (ref 11.5–15.5)
WBC: 12.5 10*3/uL — ABNORMAL HIGH (ref 4.0–10.5)
nRBC: 0.4 % — ABNORMAL HIGH (ref 0.0–0.2)

## 2021-07-11 LAB — RENAL FUNCTION PANEL
Albumin: 2.3 g/dL — ABNORMAL LOW (ref 3.5–5.0)
Anion gap: 7 (ref 5–15)
BUN: 121 mg/dL — ABNORMAL HIGH (ref 8–23)
CO2: 20 mmol/L — ABNORMAL LOW (ref 22–32)
Calcium: 8 mg/dL — ABNORMAL LOW (ref 8.9–10.3)
Chloride: 116 mmol/L — ABNORMAL HIGH (ref 98–111)
Creatinine, Ser: 3.75 mg/dL — ABNORMAL HIGH (ref 0.44–1.00)
GFR, Estimated: 12 mL/min — ABNORMAL LOW (ref >60)
Glucose, Bld: 79 mg/dL (ref 70–99)
Phosphorus: 4.6 mg/dL (ref 2.5–4.6)
Potassium: 3.9 mmol/L (ref 3.5–5.1)
Sodium: 143 mmol/L (ref 135–145)

## 2021-07-11 LAB — URINE CULTURE

## 2021-07-11 SURGERY — IMAGING PROCEDURE, GI TRACT, INTRALUMINAL, VIA CAPSULE
Anesthesia: LOCAL

## 2021-07-11 SURGICAL SUPPLY — 1 items: TOWEL COTTON PACK 4EA (MISCELLANEOUS) ×6 IMPLANT

## 2021-07-11 NOTE — Evaluation (Signed)
Physical Therapy Evaluation Patient Details Name: [name] MRN: [id number] DOB: 1945/11/17 Today's Date: 07/11/2021  History of Present Illness  75 yo female admitted to the hospital with the working diagnosis of acute blood loss anemia due to GI bleed, suspected diverticular in origin, 2 units PRBCs required. Reported 3 days of worsening hematochezia, associated with generalized weakness.  Past medical history of ESRD sp renal transplant on 2017, CKD stage 5, history of ICH, atrial fibrillation sp ablation and PPM, sp LAA closure, DVT/PE sp IVC filter, duodenal ulcers, chronic diastolic heart failure, depression, asthma and OSA  Clinical Impression  On eval, pt required Min assist +2 for mobility. She was able to perform a step pivot over to bsc with RW. She also took a few steps with RW back to bed. Pt presents with general weakness, decreased activity tolerance, and impaired gait and balance. She requires encouragement for participation. No family present during session. Discussed d/c plan-pt stated she will return home where she lives with her husband. At this time, PT recommendation is for HHPT and 24/7 supervision/assist. Will plan to follow during hospital stay       Recommendations for follow up therapy are one component of a multi-disciplinary discharge planning process, led by the attending physician.  Recommendations may be updated based on patient status, additional functional criteria and insurance authorization.  Follow Up Recommendations Home health PT    Assistance Recommended at Discharge Frequent or constant Supervision/Assistance  Functional Status Assessment Patient has had a recent decline in their functional status and demonstrates the ability to make significant improvements in function in a reasonable and predictable amount of time.  Equipment Recommendations  None recommended by PT    Recommendations for Other Services       Precautions / Restrictions  Precautions Precautions: Fall Precaution Comments: incontinent Restrictions Weight Bearing Restrictions: No      Mobility  Bed Mobility Overal bed mobility: Needs Assistance Bed Mobility: Supine to Sit     Supine to sit: Min assist;HOB elevated Sit to supine: Mod assist   General bed mobility comments: Mod As mostly to bring LEs onto bed. Min A for supine to sit. Increased time.    Transfers Overall transfer level: Needs assistance Equipment used: Rolling walker (2 wheels) Transfers: Sit to/from Stand Sit to Stand: Min assist;+2 physical assistance;+2 safety/equipment           General transfer comment: Min +2 to power up from low EOB to RW. Sit to stand x 2, once from low bed, once from low bsc.    Ambulation/Gait Ambulation/Gait assistance: Min assist;+2 safety/equipment Gait Distance (Feet): 5 Feet Assistive device: Rolling walker (2 wheels) Gait Pattern/deviations: Step-through pattern;Decreased stride length       General Gait Details: walked short distance from bsc to hob with RW. assist to steady. fatigue easily.  Stairs            Wheelchair Mobility    Modified Rankin (Stroke Patients Only)       Balance Overall balance assessment: Needs assistance;History of Falls Sitting-balance support: Single extremity supported;Feet supported Sitting balance-Leahy Scale: Good     Standing balance support: Bilateral upper extremity supported;Reliant on assistive device for balance Standing balance-Leahy Scale: Poor                               Pertinent Vitals/Pain Pain Assessment: No/denies pain Breathing: normal Negative Vocalization: none Facial Expression: smiling or inexpressive Body  Language: relaxed Consolability: no need to console PAINAD Score: 0 Pain Intervention(s): Monitored during session    Home Living Family/patient expects to be discharged to:: Private residence Living Arrangements: Spouse/significant  other Available Help at Discharge: Family;Available 24 hours/day Type of Home: House Home Access: Stairs to enter Entrance Stairs-Rails: Can reach both;Left;Right Entrance Stairs-Number of Steps: 3 in front which is entrance pt uses.   Home Layout: Two level (basement-pt does not enter) Home Equipment: Transport planner;Wheelchair - Banker;Adaptive equipment;Hand held shower head;Tub bench;BSC/3in1;Cane - single point;Rolling Walker (2 wheels);Grab bars - tub/shower Additional Comments: Adjustable bed without rails. Sliding board. Lift recliner    Prior Function Prior Level of Function : Needs assist;History of Falls (last six months)       Physical Assist : ADLs (physical);Mobility (physical) Mobility (physical): Gait (Active with home health PT for gait training. Pt's husband provides supervision.) ADLs (physical): IADLs   ADLs Comments: Pt reports that she prefers supervision with most out of bed ADLs to feel safer. Husband provides this and performs all IADLs. Pt no longer drives and family provide transportation.     Hand Dominance   Dominant Hand: Right    Extremity/Trunk Assessment   Upper Extremity Assessment Upper Extremity Assessment: Defer to OT evaluation    Lower Extremity Assessment Lower Extremity Assessment: Generalized weakness    Cervical / Trunk Assessment Cervical / Trunk Assessment: Normal  Communication   Communication: No difficulties  Cognition Arousal/Alertness: Awake/alert Behavior During Therapy: WFL for tasks assessed/performed Overall Cognitive Status: Within Functional Limits for tasks assessed                                 General Comments: Needs encouragement to participate with therapy. Very pleasant.        General Comments      Exercises     Assessment/Plan    PT Assessment Patient needs continued PT services  PT Problem List Decreased strength;Decreased range of motion;Decreased  mobility;Decreased activity tolerance;Decreased balance;Decreased knowledge of use of DME       PT Treatment Interventions DME instruction;Gait training;Therapeutic activities;Therapeutic exercise;Patient/family education;Balance training;Functional mobility training    PT Goals (Current goals can be found in the Care Plan section)  Acute Rehab PT Goals Patient Stated Goal: home PT Goal Formulation: With patient Time For Goal Achievement: 07/25/21 Potential to Achieve Goals: Good    Frequency Min 3X/week   Barriers to discharge        Co-evaluation   Reason for Co-Treatment: Complexity of the patient's impairments (multi-system involvement);To address functional/ADL transfers   OT goals addressed during session: ADL's and self-care       AM-PAC PT "6 Clicks" Mobility  Outcome Measure Help needed turning from your back to your side while in a flat bed without using bedrails?: A Little Help needed moving from lying on your back to sitting on the side of a flat bed without using bedrails?: A Little Help needed moving to and from a bed to a chair (including a wheelchair)?: A Little Help needed standing up from a chair using your arms (e.g., wheelchair or bedside chair)?: A Little Help needed to walk in hospital room?: A Little Help needed climbing 3-5 steps with a railing? : A Little 6 Click Score: 18    End of Session Equipment Utilized During Treatment: Gait belt Activity Tolerance: Patient tolerated treatment well Patient left: in bed;with call bell/phone within reach;with bed alarm set  PT Visit Diagnosis: Muscle weakness (generalized) (M62.81);Difficulty in walking, not elsewhere classified (R26.2)    Time: 1140-1159 PT Time Calculation (min) (ACUTE ONLY): 19 min   Charges:   PT Evaluation $PT Eval Moderate Complexity: [place], PT Acute Rehabilitation  Office: [phone] Pager: [phone]

## 2021-07-11 NOTE — Progress Notes (Signed)
Subjective: No new complaints.  No reports of any further hematochezia.  Objective: Vital signs in last 24 hours: Temp:  [97.3 F (36.3 C)-97.8 F (36.6 C)] 97.7 F (36.5 C) (12/27 0601) Pulse Rate:  [69-74] 74 (12/27 0601) Resp:  [18-23] 19 (12/27 0601) BP: (108-140)/(48-77) 129/73 (12/27 0601) SpO2:  [96 %-100 %] 98 % (12/27 0601) Weight:  [52.5 kg] 52.5 kg (12/27 0852) Last BM Date: 07/09/21  Intake/Output from previous day: 12/26 0701 - 12/27 0700 In: 1404.6 [I.V.:760.6; Blood:644] Out: -  Intake/Output this shift: No intake/output data recorded.  General appearance: alert and no distress GI: soft, non-tender; bowel sounds normal; no masses,  no organomegaly  Lab Results: Recent Labs    07/09/21 0546 07/10/21 0434 07/10/21 1434 07/10/21 1715 07/11/21 0433  WBC 8.6 12.8*  --   --  12.5*  HGB 8.0* 6.3* 9.7* 9.0* 8.5*  HCT 24.4* 19.2* 29.7* 28.7* 26.6*  PLT 182 201  --   --  188   BMET Recent Labs    07/09/21 0546 07/10/21 0434 07/11/21 0433  NA 144 145 143  K 4.3 4.1 3.9  CL 113* 115* 116*  CO2 24 22 20*  GLUCOSE 94 94 79  BUN 146* 123* 121*  CREATININE 4.13* 4.21* 3.75*  CALCIUM 8.8* 8.5* 8.0*   LFT Recent Labs    07/11/21 0433  ALBUMIN 2.3*   PT/INR No results for input(s): LABPROT, INR in the last 72 hours. Hepatitis Panel No results for input(s): HEPBSAG, HCVAB, HEPAIGM, HEPBIGM in the last 72 hours. C-Diff No results for input(s): CDIFFTOX in the last 72 hours. Fecal Lactopherrin No results for input(s): FECLLACTOFRN in the last 72 hours.  Studies/Results: US Renal Transplant w/Doppler  Result Date: 07/10/2021 CLINICAL DATA:  Acute renal failure.  History of kidney transplant. EXAM: ULTRASOUND OF RENAL TRANSPLANT WITH RENAL DOPPLER ULTRASOUND TECHNIQUE: Ultrasound examination of the renal transplant was performed with gray-scale, color and duplex doppler evaluation. COMPARISON:  03/23/2021 FINDINGS: Transplant kidney location: Right  lower quadrant Transplant Kidney: Renal measurements: 8.4 x 4.4 x 4.7 cm = volume: 90.19mL. Normal in size and parenchymal echogenicity. No evidence of mass or hydronephrosis. No peri-transplant fluid collection seen. Color flow in the main renal artery:  Yes Color flow in the main renal vein:  Yes Duplex Doppler Evaluation: Main Renal Artery Resistive Index: 0.7 Venous waveform in main renal vein:  present/absent Intrarenal resistive index in upper pole:  0.7 (normal 0.6-0.8; equivocal 0.8-0.9; abnormal >= 0.9) Intrarenal resistive index in lower pole: 0.7 (normal 0.6-0.8; equivocal 0.8-0.9; abnormal >= 0.9) Bladder: Normal for degree of bladder distention. Ureteral jets identified on color Doppler imaging. Other findings:  None. IMPRESSION: Stable, unremarkable appearance of the right lower quadrant renal transplant. Electronically Signed   By: Misty Stanley M.D.   On: 07/10/2021 10:57    Medications: Scheduled:  DULoxetine  30 mg Oral Daily   feeding supplement  237 mL Oral BID BM   fluticasone furoate-vilanterol  1 puff Inhalation Daily   mycophenolate  180 mg Oral BID   predniSONE  5 mg Oral Daily   sucralfate  1 g Oral TID WC & HS   tacrolimus  1 mg Oral BID   Continuous:  sodium chloride 65 mL/hr at 07/11/21 0808    Assessment/Plan: 1) Anemia. 2) Esophagitis. 3) Small bowel AVM s/p APC. 4) Hematochezia. 5) CKD.   The patient is undergoing the VCE today.  It was administered this AM.  Plan: 1) Follow HGB. 2) Transfuse as  necessary. 3) Await VCE result.  LOS: 3 days   Kinzy Weyers D 07/11/2021, 12:55 PM

## 2021-07-11 NOTE — Care Management Important Message (Signed)
Important Message  Patient Details IM Letter given to the Patient. Name: [name] MRN: [id number] Date of Birth: 1946-01-28   Medicare Important Message Given:  Yes     Kerin Salen 07/11/2021, 3:43 PM

## 2021-07-11 NOTE — Evaluation (Signed)
Occupational Therapy Evaluation Patient Details Name: [name] MRN: [id number] DOB: [birth year] Today's Date: 07/11/2021   History of Present Illness 75 yo female admitted to the hospital with the working diagnosis of acute blood loss anemia due to GI bleed, suspected diverticular in origin, 2 units PRBCs required. Reported 3 days of worsening hematochezia, associated with generalized weakness.  Past medical history of ESRD sp renal transplant on 2017, CKD stage 5, history of ICH, atrial fibrillation sp ablation and PPM, sp LAA closure, DVT/PE sp IVC filter, duodenal ulcers, chronic diastolic heart failure, depression, asthma and OSA   Clinical Impression   Patient is currently requiring assistance with ADLs including Max to total assist with toileting, moderate assist with standing LE dressing (based on general assessment), moderate assist with LB bathing, and min guard to setup assist with seated grooming.  Pt required Minimal assist of 2 people to power up from EOB and Min As of 1 to stand from [facility]. Current level of function is below patient's typical baseline.  During this evaluation, patient was limited by generalized weakness, and impaired activity tolerance, which has the potential to impact patient's safety and independence during functional mobility, as well as performance for ADLs.  Patient lives with her spouse, who is able to provide 24/7 supervision and assistance.  Patient demonstrates good rehab potential, and should benefit from continued skilled occupational therapy services while in acute care to maximize safety, independence and quality of life at home.  Continued occupational therapy services in the home is recommended.  ?    Recommendations for follow up therapy are one component of a multi-disciplinary discharge planning process, led by the attending physician.  Recommendations may be updated based on patient status, additional functional criteria and insurance  authorization.   Follow Up Recommendations  Home health OT    Assistance Recommended at Discharge Frequent or constant Supervision/Assistance  Functional Status Assessment  Patient has had a recent decline in their functional status and demonstrates the ability to make significant improvements in function in a reasonable and predictable amount of time.  Equipment Recommendations       Recommendations for Other Services       Precautions / Restrictions Precautions Precautions: Fall Restrictions Weight Bearing Restrictions: No      Mobility Bed Mobility Overal bed mobility: Needs Assistance Bed Mobility: Supine to Sit;Sit to Supine     Supine to sit: Min assist;HOB elevated (HHA with increased time/effort.) Sit to supine: Mod assist   General bed mobility comments: Mod As mostly to bring LEs onto bed. Min guard spotting at trunk but pt  showing good control using abdominal mm.    Transfers Overall transfer level: Needs assistance Equipment used: Rolling walker (2 wheels) Transfers: Sit to/from Stand Sit to Stand: Min assist;+2 physical assistance           General transfer comment: Min +2 to power up from low EOB to RW.      Balance Overall balance assessment: History of Falls;Needs assistance Sitting-balance support: Single extremity supported;Feet supported Sitting balance-Leahy Scale: Good     Standing balance support: Bilateral upper extremity supported;Reliant on assistive device for balance;During functional activity Standing balance-Leahy Scale: Poor                             ADL either performed or assessed with clinical judgement   ADL Overall ADL's : Needs assistance/impaired Eating/Feeding: Modified independent;Bed level Eating/Feeding Details (indicate cue type and  reason): Clear liquid tray setup for pt after pt assisted back to supine. Grooming: Set up;Sitting   Upper Body Bathing: Sitting;Min guard   Lower Body Bathing:  Sitting/lateral leans;Sit to/from stand;Moderate assistance   Upper Body Dressing : Min guard;Sitting   Lower Body Dressing: Set up;Moderate assistance;Sitting/lateral leans;Sit to/from stand Lower Body Dressing Details (indicate cue type and reason): Pt able to don socks with Min guard just to complete pull up, in long sitting after setup. Anticipate need of Mod as for standing LE dressing. Toilet Transfer: BSC/3in1;Stand-pivot;Rolling walker (2 wheels);Minimal assistance;Cueing for safety Toilet Transfer Details (indicate cue type and reason): Cues for hand placement when ascending and descending to [facility]. Toileting- Clothing Manipulation and Hygiene: Maximal assistance;+2 for physical assistance;Sit to/from stand Toileting - Clothing Manipulation Details (indicate cue type and reason): Pt stood from [facility] but asked for OT to perform hygiene. Pt able to stand with BUE on RW to allow Max-Total Assist for peri care.     Functional mobility during ADLs: Minimal assistance;+2 for physical assistance;Rolling walker (2 wheels)       Vision   Vision Assessment?: No apparent visual deficits     Perception Perception Perception: Within Functional Limits   Praxis Praxis Praxis: Intact    Pertinent Vitals/Pain Pain Assessment: No/denies pain Breathing: normal Negative Vocalization: none Facial Expression: smiling or inexpressive Body Language: relaxed Consolability: no need to console PAINAD Score: 0 Pain Intervention(s): Monitored during session     Hand Dominance Right   Extremity/Trunk Assessment Upper Extremity Assessment Upper Extremity Assessment: Generalized weakness   Lower Extremity Assessment Lower Extremity Assessment: Defer to PT evaluation       Communication Communication Communication: No difficulties   Cognition Arousal/Alertness: Awake/alert Behavior During Therapy: WFL for tasks assessed/performed Overall Cognitive Status: Within Functional Limits for tasks  assessed                                 General Comments: Needs encouragement to participate with therapy. Very pleasant.     General Comments       Exercises     Shoulder Instructions      Home Living Family/patient expects to be discharged to:: Private residence Living Arrangements: Spouse/significant other Available Help at Discharge: Family;Available 24 hours/day Type of Home: House Home Access: Stairs to enter [employer] of Steps: 3 in front which is entrance pt uses. Entrance Stairs-Rails: Can reach both;Left;Right Home Layout: Two level (basement-pt does not enter)   Alternate Level Stairs-Rails: Right;Left;Can reach both Bathroom Shower/Tub: Tub/shower unit;Walk-in shower (Uses tub shower mainly with tub transfer bench)   Bathroom Toilet: Handicapped height     Home Equipment: Transport planner;Wheelchair - Banker;Adaptive equipment;Hand held shower head;Tub bench;BSC/3in1;Cane - single point;Rolling Walker (2 wheels);Grab bars - tub/shower Adaptive Equipment: Reacher;Sock aid;Long-handled sponge Additional Comments: Adjustable bed without rails. Sliding board. Lift recliner      Prior Functioning/Environment Prior Level of Function : Needs assist;History of Falls (last six months)       Physical Assist : ADLs (physical);Mobility (physical) Mobility (physical): Gait (Active with home health PT for gait training. Pt's husband provides supervision.) ADLs (physical): IADLs   ADLs Comments: Pt reports that she prefers supervision with most out of bed ADLs to feel safer. Husband provides this and performs all IADLs. Pt no longer drives and family provide transportation.        OT Problem List: Decreased strength;Cardiopulmonary status limiting activity;Decreased activity tolerance;Decreased knowledge of  use of DME or AE;Impaired balance (sitting and/or standing)      OT Treatment/Interventions: Self-care/ADL  training;Therapeutic exercise;Therapeutic activities;Energy conservation;Patient/family education;DME and/or AE instruction;Balance training    OT Goals(Current goals can be found in the care plan section) Acute Rehab OT Goals Patient Stated Goal: Resume home health PT and OT and not go to an inpatient rehab facility. OT Goal Formulation: With patient Time For Goal Achievement: 07/25/21 Potential to Achieve Goals: Good ADL Goals Pt Will Perform Lower Body Bathing: with supervision;with adaptive equipment;sit to/from stand;sitting/lateral leans Pt Will Perform Lower Body Dressing: with set-up;with adaptive equipment;sitting/lateral leans;sit to/from stand Pt Will Transfer to Toilet: ambulating;with supervision Pt Will Perform Toileting - Clothing Manipulation and hygiene: with adaptive equipment;sitting/lateral leans;sit to/from stand;with modified independence Additional ADL Goal #1: Pt will engage in at least 10 min of functional activities including but not limited to exercise, grooming at sink, in-room or hallway ambulation, with vitals stable, without loss of balance, and with no more assistance than supervision in order to demonstrate improved activity tolerance and balance needed to perform ADLs safely at home. Additional ADL Goal #2: Patient will tolerate BUE home exercise program 10-15 reps x 2 sets within pain-free ranges, in an unsupported seated position and with vitals stable, in order to improve upper body strength, endurance and core stability needed to complete home ADLs.  OT Frequency: Min 2X/week   Barriers to D/C:            Co-evaluation PT/OT/SLP Co-Evaluation/Treatment: Yes Reason for Co-Treatment: Complexity of the patient's impairments (multi-system involvement);To address functional/ADL transfers   OT goals addressed during session: ADL's and self-care      AM-PAC OT "6 Clicks" Daily Activity     Outcome Measure Help from another person eating meals?: None Help  from another person taking care of personal grooming?: A Little Help from another person toileting, which includes using toliet, bedpan, or urinal?: A Lot Help from another person bathing (including washing, rinsing, drying)?: A Lot Help from another person to put on and taking off regular upper body clothing?: A Little Help from another person to put on and taking off regular lower body clothing?: A Lot 6 Click Score: 16   End of Session Equipment Utilized During Treatment: Gait belt;Rolling walker (2 wheels) Nurse Communication: Other (comment) (Pt with bowel movement in BSC. Search for "camera pill")  Activity Tolerance: Patient tolerated treatment well Patient left: in bed;with call bell/phone within reach;with bed alarm set  OT Visit Diagnosis: Unsteadiness on feet (R26.81);Muscle weakness (generalized) (M62.81);History of falling (Z91.81)                Time: 0981-1914 OT Time Calculation (min): 22 min Charges:  OT General Charges $OT Visit: 1 Visit OT Evaluation $OT Eval Low Complexity: [place], [place] Office: [phone] 07/11/2021  Julien Girt 07/11/2021, 2:52 PM

## 2021-07-11 NOTE — Progress Notes (Signed)
[facility] Progress Note  Subjective: no UOP recorded, creat down 3.75, BUN 121. Hb down slightly to 8.5 from 9.0 yesterday. BP's wnl.   Vitals:   07/10/21 1354 07/10/21 2101 07/11/21 0601 07/11/21 0852  BP: 116/65 140/77 129/73   Pulse: 69 71 74   Resp: $Remo'18 20 19   'EKonL$ Temp: 97.8 F (36.6 C) (!) 97.3 F (36.3 C) 97.7 F (36.5 C)   TempSrc: Oral Oral Oral   SpO2: 100% 98% 98%   Weight:    52.5 kg  Height:    '5\' 5"'$  (1.651 m)    Exam: Gen alert, no distress No rash, cyanosis or gangrene Sclera anicteric, throat clear  No jvd or bruits Chest clear bilat to bases, no rales/ wheezing RRR no MRG Abd soft ntnd no mass or ascites +bs GU normal w/ purewick cath in place MS no joint effusions or deformity Ext no LE or UE edema, no wounds or ulcers Neuro is alert, Ox 3 , nf     Home meds include - asa, symbicort, coreg 25 bid, colace, cymbalta, hydocan prn, myfortic 180 bid, prednisone 5 qd, prograf $RemoveBef'1mg'MIcwIojuQW$  bid, ventolin hfa prn, lasix 80 qd, kdur 10 qd, prns/ supps / vits      UA - large Hb, small LE, trace prot, many bact, 0-5 rbc, 11-20 wbc   UNa - 21, UCr - 50    Transplant renal US 12/26 - IMPRESSION: Stable, unremarkable appearance of the right lower quadrant renal transplant.     No hypotension, SBP's 100- 137 since admit     I/O 3.0 L in and 260 cc out     UOP 200 cc 12/24, nothing recorded 12/25 or today     Na 145  K 4.1  CO2 22 BUN 123  Cr 4.21  Hb 6.3 >. 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 Pt here w/ subacute GIB/ melena and Hb 5.8 w/ AKI creat 4.30. No hypotension. dry on exam. Gave bolus and started IVF"s. Creat down today to 3.75, BUN up w/ GIB. Ox 3, no indication for RRT. Should continue to improve. Cont IVF. Will follow.  SP  renal transplant- in 2017, continue prograf / myfortic/ pred  GI bleed - sp prbcs, per pmd and GI Anemia - d/t ABL +/- CKD HTN - holding home BP lowering meds OSA - on CPAP   Rob Falicia Lizotte 07/11/2021, 9:05 AM   Recent Labs  Lab 07/10/21 0434 07/10/21 1434 07/10/21 1715 07/11/21 0433  K 4.1  --   --  3.9  BUN 123*  --   --  121*  CREATININE 4.21*  --   --  3.75*  CALCIUM 8.5*  --   --  8.0*  PHOS  --   --   --  4.6  HGB 6.3*   < > 9.0* 8.5*   < > = values in this interval not displayed.   Inpatient medications:  DULoxetine  30 mg Oral Daily   feeding supplement  237 mL Oral BID BM   fluticasone furoate-vilanterol  1 puff Inhalation Daily   mycophenolate  180 mg Oral  BID   predniSONE  5 mg Oral Daily   sucralfate  1 g Oral TID WC & HS   tacrolimus  1 mg Oral BID    sodium chloride 65 mL/hr at 07/11/21 6282   acetaminophen **OR** acetaminophen, albuterol, ondansetron **OR** ondansetron (ZOFRAN) IV

## 2021-07-11 NOTE — Progress Notes (Signed)
Patient refuses CPAP 

## 2021-07-11 NOTE — Progress Notes (Signed)
PROGRESS NOTE    JAHNIAH PALLAS  TZG:[id number] DOB: Dec 06, 1945 DOA: 07/07/2021 PCP: [name], NP    Chief Complaint  Patient presents with   Rectal Bleeding    Brief Narrative:  Mrs. Clinger was admitted to the hospital with the working diagnosis of acute blood loss anemia due to gi bleed, suspected diverticular in origin.    75 yo female with the past medical history of ESRD sp renal transplant on 2017, CKD stage 5, history of ICH, atrial fibrillation sp ablation and PPM, sp LAA closure, DVT/PE sp IVC filter, duodenal ulcers, chronic diastolic heart failure, depression, asthma and OSA who presented with bright red blood per rectum. Reported 3 days of worsening hematochezia, associated with generalized weakness but no abdominal pain. On her initial physical examination her blood pressure was 117/70, HR 74, RR 19 and oxygen saturation 100% on room air. Lungs were clear to auscultation, heart with S1 and S2 present, abdomen soft and non tender, no lower extremity edema.    Sodium 144, potassium 4.3, chloride 116, bicarb 18, glucose 71, BUN 118, creatinine 3.69, white count 11.5, hemoglobin 5.8, hematocrit 18.0, platelets 168. SARS COVID-19 negative.   EKG 70 bpm, left axis deviation, prolonged QRS with ventricular pacing, no significant ST segment or T wave changes.   Patient received 2 units packed red blood cells and was placed on intravenous pantoprazole.   Hgb and Hct improved with PRBC transfusion. GI was consulted for further recommendations.  Hemoglobin dropped again to 6.8 on 07/10/2021 and assessed units packed red blood cells ordered and patient for EGD/colonoscopy today per GI.     Assessment & Plan:   Active Problems:   Symptomatic anemia   Obstructive sleep apnea   Renal transplant recipient   Hypertension   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 (HCC)   Acute renal failure (HCC)   Acute GI bleeding   Azotemia   Pressure  injury of skin  #1 acute GI bleed/acute blood loss anemia/esophageal ulceration/duodenal ulceration/grade D LA erosive esophagitis -Patient with improving hematochezia. -Status post transfusion 4 units packed red blood cells total during this hospitalization. -Hemoglobin currently at 8.5 from 6.3 from 8.5 from 5.8 on admission . -Hemoglobin noted at 10.7 (06/14/2021 ). 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 -Flexible sigmoidoscopy (07/10/2021) with blood noted in the rectum, rectosigmoid colon and sigmoid colon with internal and external hemorrhoids. -Hemoglobin stable at 8.5. -Continue IV PPI. -Patient currently undergoing capsule endoscopy. -Serial H&H. -Transfusion threshold hemoglobin < 7.  -GI following and appreciate input and recommendations.  2.  Acute kidney injury on CKD stage V -Likely secondary to prerenal azotemia secondary to problem #1. -Creatinine currently at 3.75 from 4.21 from 3.69 on admission. -Creatinine noted at 3.25 ( 03/29/2021). -UA with large hemoglobin, small leukocytes, trace protein, many bacteria, 11-20 WBCs.   -Urine sodium at 21, urine creatinine of 50.11.   -Transplant ultrasound unremarkable. -Status post transfusion 4 units packed red blood cells during this hospitalization.   -On IV fluids. -Continue tacrolimus, Myfortic, prednisone. -Nephrology following.  3.  HTN -BP soft and improving with hydration and transfusion of packed red blood cells.  4.  Asthma -Stable. -Continue Breo, albuterol.   5.  Depression/anxiety -Continue duloxetine.   6.  OSA -CPAP nightly.  7.  Stage II sacral pressure ulcer, POA -  Continue current wound care. Pressure Injury 07/08/21 Sacrum Stage 2 -  Partial thickness loss of dermis presenting as a shallow open  injury with a red, pink wound bed without slough. (Active)  07/08/21 0358  Location: Sacrum  Location Orientation:   Staging: Stage 2 -  Partial thickness loss of dermis presenting as a shallow open injury with a red, pink wound bed without slough.  Wound Description (Comments):   Present on Admission: Yes         DVT prophylaxis: SCDs Code Status: Full Family Communication: Updated patient and husband at bedside. Disposition:   Status is: Inpatient  Remains inpatient appropriate because: Anemia/ongoing GI bleed/severity of illness.       Consultants:  Gastroenterology: Dr. Rush Landmark 07/09/2021  Procedures:  Upper endoscopy/flexible sigmoidoscopy 07/10/2021 2 units packed red blood cells 07/08/2021 2 units packed red blood cells 07/10/2021 Capsule endoscopy pending 07/11/2021 Transplant renal ultrasound 07/10/2021  Antimicrobials: None   Subjective: Sitting up in bed undergoing capsule endoscopy.  Denies any chest pain.  No shortness of breath.  No abdominal pain.  Stated had some bloody loose stools early on last night with prep however as time went on it cleared up.  Overall feeling better than she did on admission.  States has good urine output.  Husband at bedside.    Objective: Vitals:   07/10/21 1354 07/10/21 2101 07/11/21 0601 07/11/21 0852  BP: 116/65 140/77 129/73   Pulse: 69 71 74   Resp: 18 20 19    Temp: 97.8 F (36.6 C) (!) 97.3 F (36.3 C) 97.7 F (36.5 C)   TempSrc: Oral Oral Oral   SpO2: 100% 98% 98%   Weight:    52.5 kg  Height:    5\' 5"  (1.651 m)    Intake/Output Summary (Last 24 hours) at 07/11/2021 1235 Last data filed at 07/11/2021 0600 Gross per 24 hour  Intake 760.58 ml  Output --  Net 760.58 ml    Filed Weights   07/08/21 0327 07/10/21 1155 07/11/21 0852  Weight: 52.5 kg 52.5 kg 52.5 kg    Examination:  General exam: NAD. Respiratory system: Lungs clear to auscultation bilaterally.  No wheezes, no crackles, no rhonchi.   Normal respiratory effort.  Speaking in full sentences.   Cardiovascular system: Regular rate and rhythm no murmurs rubs or gallops.  No JVD.  No lower extremity edema.   Gastrointestinal system: Abdomen is soft, nontender, nondistended, positive bowel sounds.  No rebound.  No guarding. Central nervous system: Alert and oriented. No focal neurological deficits. Extremities: Symmetric 5 x 5 power. Skin: No rashes, lesions or ulcers Psychiatry: Judgement and insight appear normal. Mood & affect appropriate.     Data Reviewed: I have personally reviewed following labs and imaging studies  CBC: Recent Labs  Lab 07/07/21 2245 07/07/21 2333 07/08/21 1054 07/08/21 1604 07/09/21 0546 07/10/21 0434 07/10/21 1434 07/10/21 1715 07/11/21 0433  WBC 11.5*  --  10.3  --  8.6 12.8*  --   --  12.5*  NEUTROABS 9.2*  --   --   --   --   --   --   --  9.4*  HGB 5.8*   < > 8.5*   < > 8.0* 6.3* 9.7* 9.0* 8.5*  HCT 18.0*   < > 25.4*   < > 24.4* 19.2* 29.7* 28.7* 26.6*  MCV 82.6  --  81.9  --  84.1 85.0  --   --  84.7  PLT 168  --  162  --  182 201  --   --  188   < > = values in this interval not displayed.     Basic Metabolic Panel: Recent Labs  Lab 07/07/21 1908 07/07/21 2333 07/09/21 0546 07/10/21 0434 07/11/21 0433  NA 144 143 144 145 143  K 4.3 3.9 4.3 4.1 3.9  CL 116* 112* 113* 115* 116*  CO2 18*  --  24 22 20*  GLUCOSE 71 81 94 94 79  BUN 118* >130* 146* 123* 121*  CREATININE 3.69* 4.30* 4.13* 4.21* 3.75*  CALCIUM 7.5*  --  8.8* 8.5* 8.0*  PHOS  --   --   --   --  4.6     GFR: Estimated Creatinine Clearance: 11.2 mL/min (A) (by C-G formula based on SCr of 3.75 mg/dL (H)).  Liver Function Tests: Recent Labs  Lab 07/07/21 1908 07/11/21 0433  AST 10*  --   ALT 9  --   ALKPHOS 43  --   BILITOT 0.9  --   PROT 5.1*  --   ALBUMIN 2.6* 2.3*     CBG: No results for input(s): GLUCAP in the last 168 hours.   Recent Results (from the past 240 hour(s))  Resp Panel by  RT-PCR (Flu A&B, Covid) Nasopharyngeal Swab     Status: None   Collection Time: 07/07/21  6:19 PM   Specimen: Nasopharyngeal Swab; Nasopharyngeal(NP) swabs in vial transport medium  Result Value Ref Range Status   SARS Coronavirus 2 by RT PCR NEGATIVE NEGATIVE Final    Comment: (NOTE) SARS-CoV-2 target nucleic acids are NOT DETECTED.  The SARS-CoV-2 RNA is generally detectable in upper respiratory specimens during the acute phase of infection. The lowest concentration of SARS-CoV-2 viral copies this assay can detect is 138 copies/mL. A negative result does not preclude SARS-Cov-2 infection and should not be used as the sole basis for treatment or other patient management decisions. A negative result may occur with  improper specimen collection/handling, submission of specimen other than nasopharyngeal swab, presence of viral mutation(s) within the areas targeted by this assay, and inadequate number of viral copies(<138 copies/mL). A negative result must be combined with clinical observations, patient history, and epidemiological information. The expected result is Negative.  Fact Sheet for Patients:  EntrepreneurPulse.com.au  Fact Sheet for Healthcare Providers:  IncredibleEmployment.be  This test is no t yet approved or cleared by the [place] FDA and  has been authorized for detection and/or diagnosis of SARS-CoV-2 by FDA under an Emergency Use Authorization (EUA). This EUA will remain  in effect (meaning this test can be used) for the duration of the COVID-19 declaration under Section 564(b)(1) of the Act, 21 U.S.C.section 360bbb-3(b)(1), unless the authorization is terminated  or revoked sooner.       Influenza A by PCR NEGATIVE NEGATIVE Final   Influenza B by PCR NEGATIVE NEGATIVE Final    Comment: (NOTE) The Xpert Xpress SARS-CoV-2/FLU/RSV plus assay is intended as an aid in the diagnosis of influenza from Nasopharyngeal swab  specimens and should not be used as a sole basis for treatment. Nasal washings and aspirates are unacceptable for Xpert Xpress SARS-CoV-2/FLU/RSV testing.  Fact Sheet for Patients: EntrepreneurPulse.com.au  Fact Sheet for Healthcare Providers: IncredibleEmployment.be  This test is not yet approved or cleared by the [place] FDA and has been authorized for detection and/or diagnosis of SARS-CoV-2 by FDA under an Emergency Use Authorization (EUA). This EUA will remain in effect (meaning this test can be used) for the duration of the  COVID-19 declaration under Section 564(b)(1) of the Act, 21 U.S.C. section 360bbb-3(b)(1), unless the authorization is terminated or revoked.  Performed at [facility], [place] [address] [place],  [address]           Radiology Studies: US Renal Transplant w/Doppler  Result Date: 07/10/2021 CLINICAL DATA:  Acute renal failure.  History of kidney transplant. EXAM: ULTRASOUND OF RENAL TRANSPLANT WITH RENAL DOPPLER ULTRASOUND TECHNIQUE: Ultrasound examination of the renal transplant was performed with gray-scale, color and duplex doppler evaluation. COMPARISON:  03/23/2021 FINDINGS: Transplant kidney location: Right lower quadrant Transplant Kidney: Renal measurements: 8.4 x 4.4 x 4.7 cm = volume: 90.37mL. Normal in size and parenchymal echogenicity. No evidence of mass or hydronephrosis. No peri-transplant fluid collection seen. Color flow in the main renal artery:  Yes Color flow in the main renal vein:  Yes Duplex Doppler Evaluation: Main Renal Artery Resistive Index: 0.7 Venous waveform in main renal vein:  present/absent Intrarenal resistive index in upper pole:  0.7 (normal 0.6-0.8; equivocal 0.8-0.9; abnormal >= 0.9) Intrarenal resistive index in lower pole: 0.7 (normal 0.6-0.8; equivocal 0.8-0.9; abnormal >= 0.9) Bladder: Normal for degree of bladder distention. Ureteral jets identified on  color Doppler imaging. Other findings:  None. IMPRESSION: Stable, unremarkable appearance of the right lower quadrant renal transplant. Electronically Signed   By: Misty Stanley M.D.   On: 07/10/2021 10:57        Scheduled Meds:  DULoxetine  30 mg Oral Daily   feeding supplement  237 mL Oral BID BM   fluticasone furoate-vilanterol  1 puff Inhalation Daily   mycophenolate  180 mg Oral BID   predniSONE  5 mg Oral Daily   sucralfate  1 g Oral TID WC & HS   tacrolimus  1 mg Oral BID   Continuous Infusions:  sodium chloride 65 mL/hr at 07/11/21 9326     LOS: 3 days    Time spent: 40 minutes    Irine Seal, MD [facility]   To contact the attending provider between 7A-7P or the covering provider during after hours 7P-7A, please log into the web site [email or web address] and access using universal [place] password for that web site. If you do not have the password, please call the hospital operator.  07/11/2021, 12:35 PM

## 2021-07-12 ENCOUNTER — Encounter (HOSPITAL_COMMUNITY): Payer: Self-pay | Admitting: Gastroenterology

## 2021-07-12 DIAGNOSIS — K209 Esophagitis, unspecified without bleeding: Secondary | ICD-10-CM

## 2021-07-12 DIAGNOSIS — K269 Duodenal ulcer, unspecified as acute or chronic, without hemorrhage or perforation: Secondary | ICD-10-CM

## 2021-07-12 DIAGNOSIS — I10 Essential (primary) hypertension: Secondary | ICD-10-CM | POA: Diagnosis not present

## 2021-07-12 DIAGNOSIS — K121 Other forms of stomatitis: Secondary | ICD-10-CM

## 2021-07-12 DIAGNOSIS — K2210 Ulcer of esophagus without bleeding: Secondary | ICD-10-CM

## 2021-07-12 DIAGNOSIS — N179 Acute kidney failure, unspecified: Secondary | ICD-10-CM | POA: Diagnosis not present

## 2021-07-12 DIAGNOSIS — K649 Unspecified hemorrhoids: Secondary | ICD-10-CM

## 2021-07-12 DIAGNOSIS — R7989 Other specified abnormal findings of blood chemistry: Secondary | ICD-10-CM | POA: Diagnosis not present

## 2021-07-12 DIAGNOSIS — K922 Gastrointestinal hemorrhage, unspecified: Secondary | ICD-10-CM | POA: Diagnosis not present

## 2021-07-12 LAB — RENAL FUNCTION PANEL
Albumin: 2.2 g/dL — ABNORMAL LOW (ref 3.5–5.0)
Anion gap: 10 (ref 5–15)
BUN: 91 mg/dL — ABNORMAL HIGH (ref 8–23)
CO2: 16 mmol/L — ABNORMAL LOW (ref 22–32)
Calcium: 8.2 mg/dL — ABNORMAL LOW (ref 8.9–10.3)
Chloride: 118 mmol/L — ABNORMAL HIGH (ref 98–111)
Creatinine, Ser: 3.26 mg/dL — ABNORMAL HIGH (ref 0.44–1.00)
GFR, Estimated: 14 mL/min — ABNORMAL LOW (ref >60)
Glucose, Bld: 84 mg/dL (ref 70–99)
Phosphorus: 4.4 mg/dL (ref 2.5–4.6)
Potassium: 3.8 mmol/L (ref 3.5–5.1)
Sodium: 144 mmol/L (ref 135–145)

## 2021-07-12 LAB — HEMOGLOBIN AND HEMATOCRIT, BLOOD
HCT: 27.1 % — ABNORMAL LOW (ref 36.0–46.0)
HCT: 27.3 % — ABNORMAL LOW (ref 36.0–46.0)
Hemoglobin: 8.6 g/dL — ABNORMAL LOW (ref 12.0–15.0)
Hemoglobin: 8.5 g/dL — ABNORMAL LOW (ref 12.0–15.0)

## 2021-07-12 LAB — CBC
HCT: 23.6 % — ABNORMAL LOW (ref 36.0–46.0)
Hemoglobin: 7.5 g/dL — ABNORMAL LOW (ref 12.0–15.0)
MCH: 27.5 pg (ref 26.0–34.0)
MCHC: 31.8 g/dL (ref 30.0–36.0)
MCV: 86.4 fL (ref 80.0–100.0)
Platelets: 202 10*3/uL (ref 150–400)
RBC: 2.73 MIL/uL — ABNORMAL LOW (ref 3.87–5.11)
RDW: 20.4 % — ABNORMAL HIGH (ref 11.5–15.5)
WBC: 9.4 10*3/uL (ref 4.0–10.5)
nRBC: 0.2 % (ref 0.0–0.2)

## 2021-07-12 LAB — TACROLIMUS LEVEL: Tacrolimus (FK506) - LabCorp: 2.6 ng/mL (ref 2.0–20.0)

## 2021-07-12 MED ORDER — PANTOPRAZOLE SODIUM 40 MG PO TBEC
40.0000 mg | DELAYED_RELEASE_TABLET | Freq: Two times a day (BID) | ORAL | Status: DC
  Administered 2021-07-12 – 2021-07-13 (×2): 40 mg via ORAL
  Filled 2021-07-12 (×2): qty 1

## 2021-07-12 NOTE — Progress Notes (Signed)
Pt found to have bloody liquid and clotted blood in her mouth. Brushed teeth, no bleeding noted in mouth. Lower lip has several ulcerated areas that patient states have been bleeding off and on. Currently bleeding a tiny amount after stimulation while brushing teeth. Possible source of blood in her mouth. Will countinue to monitor.  Carmex applied to lips; pt states she has been applying this several times a day. Coolidge Breeze, RN 07/12/2021

## 2021-07-12 NOTE — Progress Notes (Signed)
Pt refuses CPAP QHS. 

## 2021-07-12 NOTE — Progress Notes (Signed)
PROGRESS NOTE  [name] UYQ:[id number] DOB: 07-10-1946   PCP: [name], NP  Patient is from: Home  DOA: 07/07/2021 LOS: 4  Chief complaints:  Chief Complaint  Patient presents with   Rectal Bleeding     Brief Narrative / Interim history: 75 year old F with PMH of ESRD s/p renal transplant in 2017 now with CKD-5, ICH, A. Fib s/p ablation and LAA closure, AVB s/p PPM, DVT/PE s/p IVC filter, duodenal ulcer, diastolic CHF, asthma, OSA and depression presenting with hematochezia and generalized weakness for 3 days, and admitted for acute blood loss anemia due to rectal bleed thought to be diverticular, and AKI on CKD-5.  Hgb 5.8.  Transfused 4 units.  GI and nephrology consulted.  EGD revealed esophageal ulcer, duodenal ulcer,  grade D LA erosive esophagitis and nonbleeding AVM treated with APC.  Flexible sigmoidoscopy showed blood in rectum, rectosigmoid colon and sigmoid colon with internal and external hemorrhoids.  Video capsule endoscopy started on 07/11/2021.  Patient's AKI resolved.  Nephrology signed off.    Subjective: Seen and examined earlier this morning.  No major events overnight of this morning.  She denies further hematochezia but admits to melena this morning.  Feels better from weakness standpoint.  She denies palpitation, dizziness, chest pain or dyspnea.  Objective: Vitals:   07/11/21 1257 07/11/21 1940 07/12/21 0433 07/12/21 0749  BP: 136/74 (!) 141/69 (!) 142/75   Pulse: 70 70 71   Resp: 20 20 18    Temp: 98 F (36.7 C) 98.2 F (36.8 C) 98.2 F (36.8 C)   TempSrc: Oral Oral Oral   SpO2: 100% 96% 94% 95%  Weight:      Height:        Examination:  GENERAL: Frail looking elderly female.  Nontoxic. HEENT:Vision and hearing grossly intact.  Ulceration over lower lips.  NECK: Supple.  No apparent JVD.  RESP: 95% on RA at rest.  No IWOB.  Fair aeration bilaterally. CVS:  RRR. Heart sounds normal.  ABD/GI/GU: BS+. Abd soft, NTND.  MSK/EXT:   Moves extremities. No apparent deformity.  Trace BLE edema. SKIN: no apparent skin lesion or wound NEURO: Awake, alert and oriented appropriately.  No apparent focal neuro deficit. PSYCH: Calm. Normal affect.   Procedures:  12/26-EGD revealed grade D LA erosive esophagitis, esophageal ulcer, duodenal ulcer and nonbleeding AVM treated with APC 12/26-flexible sigmoidoscopy showed blood in rectum, rectosigmoid colon and sigmoid colon with internal and external hemorrhoids.   12/27-video capsule endoscopy started  Microbiology summarized: COVID-19 and influenza PCR nonreactive.  Assessment & Plan: Acute blood loss anemia/hematochezia-felt to be diverticular bleed.  Baseline Hgb~10. Erosive esophagitis/esophageal ulceration/duodenal ulceration Recent Labs    07/08/21 1054 07/08/21 1604 07/09/21 0546 07/10/21 0434 07/10/21 1434 07/10/21 1715 07/11/21 0433 07/11/21 1803 07/12/21 0440 07/12/21 0840  HGB 8.5* 8.6* 8.0* 6.3* 9.7* 9.0* 8.5* 8.5* 7.5* 8.6*  -EGD and flexible sigmoidoscopy -Hgb seems to be stable at 8.6 -Capsule endoscopy results pending. -Discontinue IV fluid. -P.o. Protonix 40 mg twice daily -Continue Carafate ACHS -Monitor H&H.  AKI/azotemia on CKD-5: B/l Cr ranges from 3-3.2.  Azotemia likely due to upper GI bleed.  AKI resolved. History of ESRD s/p renal transplant in 2017 Recent Labs    03/25/21 0223 03/29/21 1452 06/14/21 1319 06/15/21 0651 07/07/21 1908 07/07/21 2333 07/09/21 0546 07/10/21 0434 07/11/21 0433 07/12/21 0440  BUN 42* 30* 34* 38* 118* >130* 146* 123* 121* 91*  CREATININE 3.02* 3.25* 3.62* 3.21* 3.69* 4.30* 4.13* 4.21* 3.75* 3.26*  -Nephrology  signed off. -Continue home transplant meds including Prograf, Myfortic and prednisone -Discontinue IV fluid -Recheck in the morning  Essential hypertension: Normotensive for most part. -Continue monitoring  History of asthma: Stable. -Continue Breo, albuterol.   Anxiety and  depression -Continue duloxetine.   OSA on CPAP -CPAP nightly.  Physical deconditioning -PT/OT  Oral ulceration: Looks viral.  She is immunocompromised.  Could be herpetic gingivostomatitis -Check serum HSV IgG and coxsackie  History of A. Fib s/p ablation and LAA closure History of AVB s/p PPM  History of DVT/PE s/p IVC filter  History of asthma: Stable. -Continue Breo Ellipta and as needed albuterol  Inadequate oral intake Body mass index is 19.26 kg/m. Nutrition Problem: Inadequate oral intake Etiology: acute illness Signs/Symptoms: NPO status    Stage II sacral pressure ulcer, POA Pressure Injury 07/08/21 Sacrum Stage 2 -  Partial thickness loss of dermis presenting as a shallow open injury with a red, pink wound bed without slough. (Active)  07/08/21 0358  Location: Sacrum  Location Orientation:   Staging: Stage 2 -  Partial thickness loss of dermis presenting as a shallow open injury with a red, pink wound bed without slough.  Wound Description (Comments):   Present on Admission: Yes   DVT prophylaxis:  SCDs Start: 07/08/21 0120  Code Status: Full code Family Communication: Patient and/or RN. Available if any question.  Level of care: Med-Surg Status is: Inpatient  Remains inpatient appropriate because: Acute symptomatic blood loss anemia/GI bleed       Consultants:  Gastroenterology Nephrology   Sch Meds:  Scheduled Meds:  DULoxetine  30 mg Oral Daily   feeding supplement  237 mL Oral BID BM   fluticasone furoate-vilanterol  1 puff Inhalation Daily   mycophenolate  180 mg Oral BID   predniSONE  5 mg Oral Daily   sucralfate  1 g Oral TID WC & HS   tacrolimus  1 mg Oral BID   Continuous Infusions: PRN Meds:.acetaminophen **OR** acetaminophen, albuterol, ondansetron **OR** ondansetron (ZOFRAN) IV  Antimicrobials: Anti-infectives (From admission, onward)    None        I have personally reviewed the following labs and  images: CBC: Recent Labs  Lab 07/07/21 2245 07/07/21 2333 07/08/21 1054 07/08/21 1604 07/09/21 0546 07/10/21 0434 07/10/21 1434 07/10/21 1715 07/11/21 0433 07/11/21 1803 07/12/21 0440 07/12/21 0840  WBC 11.5*  --  10.3  --  8.6 12.8*  --   --  12.5*  --  9.4  --   NEUTROABS 9.2*  --   --   --   --   --   --   --  9.4*  --   --   --   HGB 5.8*   < > 8.5*   < > 8.0* 6.3*   < > 9.0* 8.5* 8.5* 7.5* 8.6*  HCT 18.0*   < > 25.4*   < > 24.4* 19.2*   < > 28.7* 26.6* 27.8* 23.6* 27.1*  MCV 82.6  --  81.9  --  84.1 85.0  --   --  84.7  --  86.4  --   PLT 168  --  162  --  182 201  --   --  188  --  202  --    < > = values in this interval not displayed.   BMP &GFR Recent Labs  Lab 07/07/21 1908 07/07/21 2333 07/09/21 0546 07/10/21 0434 07/11/21 0433 07/12/21 0440  NA 144 143 144 145 143 144  K  4.3 3.9 4.3 4.1 3.9 3.8  CL 116* 112* 113* 115* 116* 118*  CO2 18*  --  24 22 20* 16*  GLUCOSE 71 81 94 94 79 84  BUN 118* >130* 146* 123* 121* 91*  CREATININE 3.69* 4.30* 4.13* 4.21* 3.75* 3.26*  CALCIUM 7.5*  --  8.8* 8.5* 8.0* 8.2*  PHOS  --   --   --   --  4.6 4.4   Estimated Creatinine Clearance: 12.9 mL/min (A) (by C-G formula based on SCr of 3.26 mg/dL (H)). Liver & Pancreas: Recent Labs  Lab 07/07/21 1908 07/11/21 0433 07/12/21 0440  AST 10*  --   --   ALT 9  --   --   ALKPHOS 43  --   --   BILITOT 0.9  --   --   PROT 5.1*  --   --   ALBUMIN 2.6* 2.3* 2.2*   No results for input(s): LIPASE, AMYLASE in the last 168 hours. No results for input(s): AMMONIA in the last 168 hours. Diabetic: No results for input(s): HGBA1C in the last 72 hours. No results for input(s): GLUCAP in the last 168 hours. Cardiac Enzymes: No results for input(s): CKTOTAL, CKMB, CKMBINDEX, TROPONINI in the last 168 hours. No results for input(s): PROBNP in the last 8760 hours. Coagulation Profile: No results for input(s): INR, PROTIME in the last 168 hours. Thyroid Function Tests: No results  for input(s): TSH, T4TOTAL, FREET4, T3FREE, THYROIDAB in the last 72 hours. Lipid Profile: No results for input(s): CHOL, HDL, LDLCALC, TRIG, CHOLHDL, LDLDIRECT in the last 72 hours. Anemia Panel: No results for input(s): VITAMINB12, FOLATE, FERRITIN, TIBC, IRON, RETICCTPCT in the last 72 hours. Urine analysis:    Component Value Date/Time   COLORURINE YELLOW 07/10/2021 1605   APPEARANCEUR HAZY (A) 07/10/2021 1605   LABSPEC <=1.005 07/10/2021 1605   PHURINE 6.0 07/10/2021 1605   GLUCOSEU NEGATIVE 07/10/2021 1605   HGBUR LARGE (A) 07/10/2021 1605   HGBUR negative 10/04/2009 1044   BILIRUBINUR NEGATIVE 07/10/2021 1605   BILIRUBINUR negative 12/13/2017 1508   KETONESUR NEGATIVE 07/10/2021 1605   PROTEINUR TRACE (A) 07/10/2021 1605   UROBILINOGEN 0.2 12/13/2017 1508   UROBILINOGEN 0.2 03/25/2012 1354   NITRITE NEGATIVE 07/10/2021 1605   LEUKOCYTESUR SMALL (A) 07/10/2021 1605   Sepsis Labs: Invalid input(s): PROCALCITONIN, [place]  Microbiology: Recent Results (from the past 240 hour(s))  Resp Panel by RT-PCR (Flu A&B, Covid) Nasopharyngeal Swab     Status: None   Collection Time: 07/07/21  6:19 PM   Specimen: Nasopharyngeal Swab; Nasopharyngeal(NP) swabs in vial transport medium  Result Value Ref Range Status   SARS Coronavirus 2 by RT PCR NEGATIVE NEGATIVE Final    Comment: (NOTE) SARS-CoV-2 target nucleic acids are NOT DETECTED.  The SARS-CoV-2 RNA is generally detectable in upper respiratory specimens during the acute phase of infection. The lowest concentration of SARS-CoV-2 viral copies this assay can detect is 138 copies/mL. A negative result does not preclude SARS-Cov-2 infection and should not be used as the sole basis for treatment or other patient management decisions. A negative result may occur with  improper specimen collection/handling, submission of specimen other than nasopharyngeal swab, presence of viral mutation(s) within the areas targeted by this  assay, and inadequate number of viral copies(<138 copies/mL). A negative result must be combined with clinical observations, patient history, and epidemiological information. The expected result is Negative.  Fact Sheet for Patients:  EntrepreneurPulse.com.au  Fact Sheet for Healthcare Providers:  IncredibleEmployment.be  This test  is no t yet approved or cleared by the [place] and  has been authorized for detection and/or diagnosis of SARS-CoV-2 by FDA under an Emergency Use Authorization (EUA). This EUA will remain  in effect (meaning this test can be used) for the duration of the COVID-19 declaration under Section 564(b)(1) of the Act, 21 U.S.C.section 360bbb-3(b)(1), unless the authorization is terminated  or revoked sooner.       Influenza A by PCR NEGATIVE NEGATIVE Final   Influenza B by PCR NEGATIVE NEGATIVE Final    Comment: (NOTE) The Xpert Xpress SARS-CoV-2/FLU/RSV plus assay is intended as an aid in the diagnosis of influenza from Nasopharyngeal swab specimens and should not be used as a sole basis for treatment. Nasal washings and aspirates are unacceptable for Xpert Xpress SARS-CoV-2/FLU/RSV testing.  Fact Sheet for Patients: EntrepreneurPulse.com.au  Fact Sheet for Healthcare Providers: IncredibleEmployment.be  This test is not yet approved or cleared by the [place] FDA and has been authorized for detection and/or diagnosis of SARS-CoV-2 by FDA under an Emergency Use Authorization (EUA). This EUA will remain in effect (meaning this test can be used) for the duration of the COVID-19 declaration under Section 564(b)(1) of the Act, 21 U.S.C. section 360bbb-3(b)(1), unless the authorization is terminated or revoked.  Performed at [facility], [place] [address], [place], [place] [address]   Urine Culture     Status: Abnormal   Collection Time: 07/10/21  4:04  PM   Specimen: Urine, Catheterized  Result Value Ref Range Status   Specimen Description   Final    URINE, CATHETERIZED Performed at [facility] [address] [place], [place] [address]    Special Requests   Final    NONE Performed at [facility], [place] [address] [place], [place] [address]    Culture MULTIPLE SPECIES PRESENT, SUGGEST RECOLLECTION (A)  Final   Report Status 07/11/2021 FINAL  Final    Radiology Studies: No results found.     Jakyra Kenealy T. [place]  If 7PM-7AM, please contact night-coverage [email or web address] 07/12/2021, 4:42 PM

## 2021-07-12 NOTE — Progress Notes (Signed)
[facility] Progress Note  Subjective: 350 cc UOP yest, +1.1 L net.  BUN down 91 and creat down 3.26.   Vitals:   07/11/21 0852 07/11/21 1257 07/11/21 1940 07/12/21 0433  BP:  136/74 (!) 141/69 (!) 142/75  Pulse:  70 70 71  Resp:  _0 Temp:  98 F (36.7 C) 98.2 F (36.8 C) 98.2 F (36.8 C)  TempSrc:  Oral Oral Oral  SpO2:  100% 96% 94%  Weight: 52.5 kg     Height: _1  (1.651 m)       Exam: Gen alert, no distress No rash, cyanosis or gangrene Sclera anicteric, throat clear  No jvd or bruits Chest clear bilat to bases, no rales/ wheezing RRR no MRG Abd soft ntnd no mass or ascites +bs GU normal w/ purewick cath in place MS no joint effusions or deformity Ext no LE or UE edema, no wounds or ulcers Neuro is alert, Ox 3 , nf     Home meds include - asa, symbicort, coreg 25 bid, colace, cymbalta, hydocan prn, myfortic 180 bid, prednisone 5 qd, prograf 68m bid, ventolin hfa prn, lasix 80 qd, kdur 10 qd, prns/ supps / vits      UA - large Hb, small LE, trace prot, many bact, 0-5 rbc, 11-20 wbc   UNa - 21, UCr - 50    Transplant renal [place] - IMPRESSION: Stable, unremarkable appearance of the right lower quadrant renal transplant.     No hypotension, SBP's 100- 137 since admit     I/O 3.0 L in and 260 cc out     UOP 200 cc 12/24, nothing recorded 12/25 or today     Na 145  K 4.1  CO2 22 BUN 123  Cr 4.21  Hb 6.3 >. 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 Pt here w/ subacute GIB/ melena and Hb 5.8 w/ AKI creat 4.30. No hypotension, dry on exam initially. Gave bolus and started IVF"s. Creat down to 3.7 yesterday and 3.26 today. Renal function at baseline now. Will lower IVF to 40 cc/ hr, can dc per pmd. [facility]. Will sign off.  SP renal transplant- in 2017, continue prograf / myfortic/ pred  GI bleed - sp prbcs, per pmd and GI Anemia - d/t ABL +/- CKD HTN - holding home BP lowering meds OSA - on CPAP   Rob Cobi [place]ape 07/12/2021, 7:35 AM   Recent Labs  Lab 07/11/21 0433 07/11/21 1803 07/12/21 0440  K 3.9  --  3.8  BUN 121*  --  91*  CREATININE 3.75*  --  3.26*  CALCIUM 8.0*  --  8.2*  PHOS 4.6  --  4.4  HGB 8.5* 8.5* 7.5*    Inpatient medications:  DULoxetine  30 mg Oral Daily   feeding supplement  237 mL Oral BID BM   fluticasone furoate-vilanterol  1 puff Inhalation Daily   mycophenolate  180 mg Oral BID   predniSONE  5 mg Oral Daily   sucralfate  1 g Oral TID WC &  HS   tacrolimus  1 mg Oral BID    sodium chloride 65 mL/hr at 07/11/21 2351   acetaminophen **OR** acetaminophen, albuterol, ondansetron **OR** ondansetron (ZOFRAN) IV

## 2021-07-13 DIAGNOSIS — K644 Residual hemorrhoidal skin tags: Secondary | ICD-10-CM

## 2021-07-13 DIAGNOSIS — K648 Other hemorrhoids: Secondary | ICD-10-CM

## 2021-07-13 LAB — HEMOGLOBIN AND HEMATOCRIT, BLOOD
HCT: 26.1 % — ABNORMAL LOW (ref 36.0–46.0)
Hemoglobin: 8.4 g/dL — ABNORMAL LOW (ref 12.0–15.0)

## 2021-07-13 LAB — CBC
HCT: 25.5 % — ABNORMAL LOW (ref 36.0–46.0)
Hemoglobin: 8 g/dL — ABNORMAL LOW (ref 12.0–15.0)
MCH: 26.9 pg (ref 26.0–34.0)
MCHC: 31.4 g/dL (ref 30.0–36.0)
MCV: 85.9 fL (ref 80.0–100.0)
Platelets: 242 10*3/uL (ref 150–400)
RBC: 2.97 MIL/uL — ABNORMAL LOW (ref 3.87–5.11)
RDW: 20.7 % — ABNORMAL HIGH (ref 11.5–15.5)
WBC: 11.7 10*3/uL — ABNORMAL HIGH (ref 4.0–10.5)
nRBC: 0.5 % — ABNORMAL HIGH (ref 0.0–0.2)

## 2021-07-13 LAB — RENAL FUNCTION PANEL
Albumin: 2.4 g/dL — ABNORMAL LOW (ref 3.5–5.0)
Anion gap: 5 (ref 5–15)
BUN: 83 mg/dL — ABNORMAL HIGH (ref 8–23)
CO2: 17 mmol/L — ABNORMAL LOW (ref 22–32)
Calcium: 8.2 mg/dL — ABNORMAL LOW (ref 8.9–10.3)
Chloride: 119 mmol/L — ABNORMAL HIGH (ref 98–111)
Creatinine, Ser: 3.26 mg/dL — ABNORMAL HIGH (ref 0.44–1.00)
GFR, Estimated: 14 mL/min — ABNORMAL LOW (ref >60)
Glucose, Bld: 107 mg/dL — ABNORMAL HIGH (ref 70–99)
Phosphorus: 4.1 mg/dL (ref 2.5–4.6)
Potassium: 3.8 mmol/L (ref 3.5–5.1)
Sodium: 141 mmol/L (ref 135–145)

## 2021-07-13 LAB — MAGNESIUM: Magnesium: 1.6 mg/dL — ABNORMAL LOW (ref 1.7–2.4)

## 2021-07-13 MED ORDER — SUCRALFATE 1 G PO TABS: 1.0000 g | ORAL_TABLET | Freq: Four times a day (QID) | ORAL | 0 refills | Status: DC

## 2021-07-13 MED ORDER — SODIUM BICARBONATE 650 MG PO TABS
650.0000 mg | ORAL_TABLET | Freq: Three times a day (TID) | ORAL | Status: DC
  Administered 2021-07-13: 11:00:00 650 mg via ORAL
  Filled 2021-07-13 (×2): qty 1

## 2021-07-13 MED ORDER — MAGNESIUM SULFATE IN D5W 1-5 GM/100ML-% IV SOLN
1.0000 g | Freq: Once | INTRAVENOUS | Status: AC
  Administered 2021-07-13: 08:00:00 1 g via INTRAVENOUS
  Filled 2021-07-13: qty 100

## 2021-07-13 MED ORDER — SODIUM BICARBONATE 650 MG PO TABS: 650.0000 mg | ORAL_TABLET | Freq: Two times a day (BID) | ORAL | 0 refills | Status: AC

## 2021-07-13 MED ORDER — PANTOPRAZOLE SODIUM 40 MG PO TBEC: DELAYED_RELEASE_TABLET | ORAL | 0 refills | Status: DC

## 2021-07-13 NOTE — Discharge Summary (Signed)
Physician Discharge Summary  [name] KCL:[id number] DOB: February 25, 1946 DOA: 07/07/2021  PCP: [name], NP  Admit date: 07/07/2021 Discharge date: 07/13/2021 Admitted From: Home Disposition: Home Recommendations for Outpatient Follow-up:  Follow ups as below. Please obtain CBC/BMP/Mag at follow up Please follow up on the following pending results: HSV-1 and 2 IgG and coxsackie A antibody pending. Home Health: PT/OT Equipment/Devices: Not indicated Discharge Condition: Stable CODE STATUS: Full code  Follow-up Information     [facility].   Specialty: Home Health Services Why: PT and OT        [name], [name], NP. Schedule an appointment as soon as possible for a visit in 1 week(s).   Specialty: Internal Medicine Contact information: [place] [address] [phone]         Carol Ada, MD. Schedule an appointment as soon as possible for a visit in 2 week(s).   Specialty: Gastroenterology Contact information: [address] [place] [address] [phone]         Andee Lineman, NP. Schedule an appointment as soon as possible for a visit in 2 week(s).   Specialty: Nephrology Why: to have your cellcept level and kidney number checked Contact information: [place] [address] [phone]                Hospital Course: 75 year old F with PMH of ESRD s/p renal transplant in 2017 now with CKD-5, ICH, A. Fib s/p ablation and LAA closure, AVB s/p PPM, DVT/PE s/p IVC filter, duodenal ulcer, diastolic CHF, asthma, OSA and depression presenting with hematochezia and generalized weakness for 3 days, and admitted for acute blood loss anemia due to rectal bleed thought to be diverticular, and AKI on CKD-5.  Hgb 5.8.  Transfused 4 units.  GI and nephrology consulted.  EGD revealed esophageal ulcer, duodenal ulcer,  grade D LA erosive esophagitis and nonbleeding  AVM treated with APC.  Flexible sigmoidoscopy showed blood in rectum, rectosigmoid colon and sigmoid colon with internal and external hemorrhoids.  Video capsule endoscopy started on 07/11/2021.  Patient's hemoglobin remained stable.  She is cleared for discharge by [facility] gastroenterology, Dr. Collene Mares who stated patient's video capsule endoscopy was normal.  She is discharged on p.o. Protonix 40 mg twice daily for 30 days followed by 40 mg daily.  She is also on Carafate 1 g 4 times daily for 1 months.  Dr. Collene Mares recommended outpatient follow-up with Dr. Benson Norway in 2 weeks.    Patient's AKI resolved.  Nephrology signed off.  Outpatient follow-up with nephrology in 2 to 3 weeks  Home health PT/OT ordered as recommended by therapy.  Plan discussed with patient and patient's husband.  See individual problem list below for more on hospital course.  Discharge Diagnoses:  Acute blood loss anemia/hematochezia-felt to be diverticular bleed.  Baseline Hgb~10. Erosive esophagitis/esophageal ulceration/duodenal ulceration Recent Labs    07/10/21 0434 07/10/21 1434 07/10/21 1715 07/11/21 0433 07/11/21 1803 07/12/21 0440 07/12/21 0840 07/12/21 1616 07/13/21 0038 07/13/21 0758  HGB 6.3* 9.7* 9.0* 8.5* 8.5* 7.5* 8.6* 8.5* 8.0* 8.4*  12/26-EGD revealed grade D LA erosive esophagitis, esophageal ulcer, duodenal ulcer and nonbleeding AVM treated with APC 12/26-flexible sigmoidoscopy showed blood in rectum, rectosigmoid colon and sigmoid colon with internal and external hemorrhoids.   12/27-video capsule endoscopy "normal" per [facility], Dr. Collene Mares. -H&H remained stable. -P.o. Protonix 40 mg twice daily for 1 month followed by 40 mg daily -Continue Carafate ACHS -  Outpatient follow-up with PCP and GI as above -Discontinued aspirin on discharge.   AKI/azotemia on CKD-5: B/l Cr ranges from 3-3.2.  Azotemia likely due to upper GI bleed.  AKI resolved. History of ESRD s/p renal transplant in 2017 Recent  Labs    03/29/21 1452 06/14/21 1319 06/15/21 0651 07/07/21 1908 07/07/21 2333 07/09/21 0546 07/10/21 0434 07/11/21 0433 07/12/21 0440 07/13/21 0038  BUN 30* 34* 38* 118* >130* 146* 123* 121* 91* 83*  CREATININE 3.25* 3.62* 3.21* 3.69* 4.30* 4.13* 4.21* 3.75* 3.26* 3.26*  -Nephrology signed off. -Continue home transplant meds including Prograf, Myfortic and prednisone -Consider checking level on Myfortic while patient is on PPI -Outpatient follow-up with PCP and nephrology as above   Essential hypertension: Normotensive off home antihypertensive meds. -Discontinued Coreg and Lasix on discharge -May consider resuming Coreg as blood pressure allows    History of asthma: Stable. -Continue Breo, albuterol.   Anxiety and depression -Continue duloxetine.   OSA on CPAP -CPAP nightly.   Physical deconditioning -HH PT/OT ordered.   Oral ulceration: Looks viral.  She is immunocompromised.  Could be herpetic gingivostomatitis.  Improved. -Follow serum HSV IgG and coxsackie   History of A. Fib s/p ablation and LAA closure History of AVB s/p PPM   History of DVT/PE s/p IVC filter   Inadequate oral intake Body mass index is 19.26 kg/m. Nutrition Problem: Inadequate oral intake Etiology: acute illness Signs/Symptoms: NPO status   Stage II sacral pressure ulcer, POA Pressure Injury 07/08/21 Sacrum Stage 2 -  Partial thickness loss of dermis presenting as a shallow open injury with a red, pink wound bed without slough. (Active)  07/08/21 0358  Location: Sacrum  Location Orientation:   Staging: Stage 2 -  Partial thickness loss of dermis presenting as a shallow open injury with a red, pink wound bed without slough.  Wound Description (Comments):   Present on Admission: Yes    Discharge Exam: Vitals:   07/13/21 0354 07/13/21 0542 07/13/21 1012 07/13/21 1024  BP: (!) 150/85 139/77 (!) 164/64 Comment: RN notified 139/81  Pulse: 71 70 70 70  Temp: 98.2 F (36.8 C) 98.2 F  (36.8 C) 97.9 F (36.6 C)   Resp: 16 16 17    Height:      Weight:      SpO2: 98% 99% 99%   TempSrc: Oral Oral    BMI (Calculated):         GENERAL: Frail looking elderly female.  Nontoxic. HEENT: MMM.  Vision and hearing grossly intact.  NECK: Supple.  No apparent JVD.  RESP:  No IWOB.  Fair aeration bilaterally. CVS:  RRR. Heart sounds normal.  LUE aVF with good bruits. ABD/GI/GU: Bowel sounds present. Soft. Non tender.  MSK/EXT:  Moves extremities. No apparent deformity. No edema.  SKIN: no apparent skin lesion or wound NEURO: Awake and alert.  Oriented appropriately.  No apparent focal neuro deficit. PSYCH: Calm. Normal affect.   Discharge Instructions  Discharge Instructions     Call MD for:  difficulty breathing, headache or visual disturbances   Complete by: As directed    Call MD for:  extreme fatigue   Complete by: As directed    Call MD for:  persistant dizziness or light-headedness   Complete by: As directed    Diet - low sodium heart healthy   Complete by: As directed    Discharge instructions   Complete by: As directed    It has been a pleasure taking care of you!  You  were hospitalized due to acute GI bleed for which you have been treated with blood transfusion.  Your bleeding seems to have stopped.  We have stopped your aspirin, and made some adjustment to your home medications.  We have also started you on new medications to reduce your risk of future bleeding.  Please review your new medication list and the directions on your medications before you take them.  Please have your kidney doctor recheck you CellCept level in 1 to 2 weeks.  Follow-up with your primary care doctor in 1 to 2 weeks.  Follow-up with gastroenterologist as recommended to you.    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 PFT with decreased DLCO   Amlodipine Swelling   Swelling in feet        Medication List     STOP taking these medications    aspirin EC 81 MG tablet   carvedilol 12.5 MG tablet Commonly known as: COREG   furosemide 80 MG tablet Commonly known as: LASIX   guaiFENesin 600 MG 12 hr tablet Commonly known as: Mucinex   HYDROcodone bit-homatropine 5-1.5 MG/5ML syrup Commonly known as: HYCODAN   levofloxacin 500 MG tablet Commonly known as: LEVAQUIN   nystatin 100000 UNIT/ML suspension Commonly known as: MYCOSTATIN   potassium chloride 10 MEQ tablet Commonly known as: KLOR-CON       TAKE these medications    acetaminophen 500 MG tablet Commonly known as: TYLENOL Take 500 mg by mouth every 6 (six) hours as needed for mild pain or headache.   albuterol (2.5 MG/3ML) 0.083% nebulizer solution Commonly known as: PROVENTIL Take 3 mLs (2.5 mg total) by nebulization every 6 (six) hours as needed for wheezing or shortness of breath. Dx:J45.909   albuterol 108 (90 Base) MCG/ACT inhaler Commonly known as: VENTOLIN HFA Inhale 1-2 puffs into the lungs every 6 (six) hours as needed for wheezing or shortness of breath.   budesonide-formoterol 160-4.5 MCG/ACT inhaler Commonly known as: SYMBICORT Inhale 2 puffs into the lungs 2 (two) times daily.   docusate sodium 100 MG capsule Commonly known as: COLACE Take 100 mg by mouth at bedtime as needed for mild constipation.   DULoxetine 30 MG capsule Commonly known as: CYMBALTA TAKE 1 CAPSULE BY MOUTH EVERY DAY What changed:  how much to take how to take this when to take this additional instructions   mycophenolate 180 MG EC tablet Commonly known as: MYFORTIC Take 180 mg by mouth 2 (two) times daily.   ondansetron 4 MG tablet Commonly known as: ZOFRAN Take  1 tablet (4 mg total) by mouth every 8 (eight) hours as needed for up to 9 doses for nausea.   pantoprazole 40 MG tablet Commonly known as: PROTONIX Take 1 tablet (40 mg total) by mouth 2 (two) times daily for 30 days, THEN 1 tablet (40 mg total) daily. Start taking on: July 13, 2021   predniSONE 5 MG tablet Commonly known as: DELTASONE Take 5 mg by mouth daily.   sodium bicarbonate 650 MG tablet Take 1 tablet (650 mg total)  by mouth 2 (two) times daily.   sucralfate 1 g tablet Commonly known as: Carafate Take 1 tablet (1 g total) by mouth 4 (four) times daily.   tacrolimus 1 MG capsule Commonly known as: PROGRAF Take 1 mg by mouth 2 (two) times daily.        Consultations: Gastroenterology Nephrology  Procedures/Studies: 12/26-EGD revealed grade D LA erosive esophagitis, esophageal ulcer, duodenal ulcer and nonbleeding AVM treated with APC 12/26-flexible sigmoidoscopy showed blood in rectum, rectosigmoid colon and sigmoid colon with internal and external hemorrhoids.   12/27-video capsule endoscopy started   DG Chest 2 View  Result Date: 07/01/2021 CLINICAL DATA:  Shortness of breath.  Transplant patient. EXAM: CHEST - 2 VIEW COMPARISON:  June 14, 2021 FINDINGS: Cardiomegaly. The hila and mediastinum are unchanged. No pneumothorax. No nodules or masses. Possible mild opacity in left base on the frontal view. Stable pacemaker. No other acute abnormalities. IMPRESSION: Possible mild opacity in the left base may represent atelectasis, confluence of shadows, or developing infiltrate. Recommend short-term follow-up imaging to ensure resolution. Electronically Signed   By: Dorise Bullion III M.D.   On: 07/01/2021 21:14   DG Chest 2 View  Result Date: 06/15/2021 CLINICAL DATA:  Asthma EXAM: CHEST - 2 VIEW COMPARISON:  X-ray chest 06/13/2021. FINDINGS: Chronic interstitial changes. Pulmonary vascular congestion. Cardiomegaly. No pleural effusion or pneumothorax. Single  lead left chest wall pacemaker and left atrial appendage occlusion. Cholecystectomy clips, IVC filter, and lap band are present. IMPRESSION: Pulmonary vascular congestion with cardiomegaly. Electronically Signed   By: Macy Mis M.D.   On: 06/15/2021 08:57   DG Chest 2 View  Result Date: 06/14/2021 CLINICAL DATA:  Shortness of breath. EXAM: CHEST - 2 VIEW COMPARISON:  June 13, 2021. FINDINGS: Stable cardiomegaly. Single lead left-sided pacemaker is unchanged in position. Both lungs are clear. The visualized skeletal structures are unremarkable. IMPRESSION: No active cardiopulmonary disease. Electronically Signed   By: Marijo Conception M.D.   On: 06/14/2021 13:19   US Renal Transplant w/Doppler  Result Date: 07/10/2021 CLINICAL DATA:  Acute renal failure.  History of kidney transplant. EXAM: ULTRASOUND OF RENAL TRANSPLANT WITH RENAL DOPPLER ULTRASOUND TECHNIQUE: Ultrasound examination of the renal transplant was performed with gray-scale, color and duplex doppler evaluation. COMPARISON:  03/23/2021 FINDINGS: Transplant kidney location: Right lower quadrant Transplant Kidney: Renal measurements: 8.4 x 4.4 x 4.7 cm = volume: 90.49mL. Normal in size and parenchymal echogenicity. No evidence of mass or hydronephrosis. No peri-transplant fluid collection seen. Color flow in the main renal artery:  Yes Color flow in the main renal vein:  Yes Duplex Doppler Evaluation: Main Renal Artery Resistive Index: 0.7 Venous waveform in main renal vein:  present/absent Intrarenal resistive index in upper pole:  0.7 (normal 0.6-0.8; equivocal 0.8-0.9; abnormal >= 0.9) Intrarenal resistive index in lower pole: 0.7 (normal 0.6-0.8; equivocal 0.8-0.9; abnormal >= 0.9) Bladder: Normal for degree of bladder distention. Ureteral jets identified on color Doppler imaging. Other findings:  None. IMPRESSION: Stable, unremarkable appearance of the right lower quadrant renal transplant. Electronically Signed   By: Misty Stanley  M.D.   On: 07/10/2021 10:57       The results of significant diagnostics from this hospitalization (including imaging, microbiology, ancillary and laboratory) are listed below for reference.     Microbiology: Recent Results (from the past 240 hour(s))  Resp Panel by RT-PCR (Flu A&B, Covid) Nasopharyngeal Swab     Status: None   Collection Time: 07/07/21  6:19 PM   Specimen: Nasopharyngeal  Swab; Nasopharyngeal(NP) swabs in vial transport medium  Result Value Ref Range Status   SARS Coronavirus 2 by RT PCR NEGATIVE NEGATIVE Final    Comment: (NOTE) SARS-CoV-2 target nucleic acids are NOT DETECTED.  The SARS-CoV-2 RNA is generally detectable in upper respiratory specimens during the acute phase of infection. The lowest concentration of SARS-CoV-2 viral copies this assay can detect is 138 copies/mL. A negative result does not preclude SARS-Cov-2 infection and should not be used as the sole basis for treatment or other patient management decisions. A negative result may occur with  improper specimen collection/handling, submission of specimen other than nasopharyngeal swab, presence of viral mutation(s) within the areas targeted by this assay, and inadequate number of viral copies(<138 copies/mL). A negative result must be combined with clinical observations, patient history, and epidemiological information. The expected result is Negative.  Fact Sheet for Patients:  EntrepreneurPulse.com.au  Fact Sheet for Healthcare Providers:  IncredibleEmployment.be  This test is no t yet approved or cleared by the [place] FDA and  has been authorized for detection and/or diagnosis of SARS-CoV-2 by FDA under an Emergency Use Authorization (EUA). This EUA will remain  in effect (meaning this test can be used) for the duration of the COVID-19 declaration under Section 564(b)(1) of the Act, 21 U.S.C.section 360bbb-3(b)(1), unless the authorization is  terminated  or revoked sooner.       Influenza A by PCR NEGATIVE NEGATIVE Final   Influenza B by PCR NEGATIVE NEGATIVE Final    Comment: (NOTE) The Xpert Xpress SARS-CoV-2/FLU/RSV plus assay is intended as an aid in the diagnosis of influenza from Nasopharyngeal swab specimens and should not be used as a sole basis for treatment. Nasal washings and aspirates are unacceptable for Xpert Xpress SARS-CoV-2/FLU/RSV testing.  Fact Sheet for Patients: EntrepreneurPulse.com.au  Fact Sheet for Healthcare Providers: IncredibleEmployment.be  This test is not yet approved or cleared by the [place] FDA and has been authorized for detection and/or diagnosis of SARS-CoV-2 by FDA under an Emergency Use Authorization (EUA). This EUA will remain in effect (meaning this test can be used) for the duration of the COVID-19 declaration under Section 564(b)(1) of the Act, 21 U.S.C. section 360bbb-3(b)(1), unless the authorization is terminated or revoked.  Performed at [facility], [place] [address], [place], [place] [address]   Urine Culture     Status: Abnormal   Collection Time: 07/10/21  4:04 PM   Specimen: Urine, Catheterized  Result Value Ref Range Status   Specimen Description   Final    URINE, CATHETERIZED Performed at [facility] [address], [place], [place] [address]    Special Requests   Final    NONE Performed at [facility], [place] [address], [place], [place] [address]    Culture MULTIPLE SPECIES PRESENT, SUGGEST RECOLLECTION (A)  Final   Report Status 07/11/2021 FINAL  Final     Labs:  CBC: Recent Labs  Lab 07/07/21 2245 07/07/21 2333 07/09/21 0546 07/10/21 0434 07/10/21 1434 07/11/21 0433 07/11/21 1803 07/12/21 0440 07/12/21 0840 07/12/21 1616 07/13/21 0038 07/13/21 0758  WBC 11.5*   < > 8.6 12.8*  --  12.5*  --  9.4  --   --  11.7*  --   NEUTROABS 9.2*  --   --    --   --  9.4*  --   --   --   --   --   --   HGB 5.8*   < >  8.0* 6.3*   < > 8.5*   < > 7.5* 8.6* 8.5* 8.0* 8.4*  HCT 18.0*   < > 24.4* 19.2*   < > 26.6*   < > 23.6* 27.1* 27.3* 25.5* 26.1*  MCV 82.6   < > 84.1 85.0  --  84.7  --  86.4  --   --  85.9  --   PLT 168   < > 182 201  --  188  --  202  --   --  242  --    < > = values in this interval not displayed.   BMP &GFR Recent Labs  Lab 07/09/21 0546 07/10/21 0434 07/11/21 0433 07/12/21 0440 07/13/21 0038  NA 144 145 143 144 141  K 4.3 4.1 3.9 3.8 3.8  CL 113* 115* 116* 118* 119*  CO2 24 22 20* 16* 17*  GLUCOSE 94 94 79 84 107*  BUN 146* 123* 121* 91* 83*  CREATININE 4.13* 4.21* 3.75* 3.26* 3.26*  CALCIUM 8.8* 8.5* 8.0* 8.2* 8.2*  MG  --   --   --   --  1.6*  PHOS  --   --  4.6 4.4 4.1   Estimated Creatinine Clearance: 12.7 mL/min (A) (by C-G formula based on SCr of 3.26 mg/dL (H)). Liver & Pancreas: Recent Labs  Lab 07/07/21 1908 07/11/21 0433 07/12/21 0440 07/13/21 0038  AST 10*  --   --   --   ALT 9  --   --   --   ALKPHOS 43  --   --   --   BILITOT 0.9  --   --   --   PROT 5.1*  --   --   --   ALBUMIN 2.6* 2.3* 2.2* 2.4*   No results for input(s): LIPASE, AMYLASE in the last 168 hours. No results for input(s): AMMONIA in the last 168 hours. Diabetic: No results for input(s): HGBA1C in the last 72 hours. No results for input(s): GLUCAP in the last 168 hours. Cardiac Enzymes: No results for input(s): CKTOTAL, CKMB, CKMBINDEX, TROPONINI in the last 168 hours. No results for input(s): PROBNP in the last 8760 hours. Coagulation Profile: No results for input(s): INR, PROTIME in the last 168 hours. Thyroid Function Tests: No results for input(s): TSH, T4TOTAL, FREET4, T3FREE, THYROIDAB in the last 72 hours. Lipid Profile: No results for input(s): CHOL, HDL, LDLCALC, TRIG, CHOLHDL, LDLDIRECT in the last 72 hours. Anemia Panel: No results for input(s): VITAMINB12, FOLATE, FERRITIN, TIBC, IRON, RETICCTPCT in the  last 72 hours. Urine analysis:    Component Value Date/Time   COLORURINE YELLOW 07/10/2021 1605   APPEARANCEUR HAZY (A) 07/10/2021 1605   LABSPEC <=1.005 07/10/2021 1605   PHURINE 6.0 07/10/2021 1605   GLUCOSEU NEGATIVE 07/10/2021 1605   HGBUR LARGE (A) 07/10/2021 1605   HGBUR negative 10/04/2009 1044   BILIRUBINUR NEGATIVE 07/10/2021 1605   BILIRUBINUR negative 12/13/2017 1508   KETONESUR NEGATIVE 07/10/2021 1605   PROTEINUR TRACE (A) 07/10/2021 1605   UROBILINOGEN 0.2 12/13/2017 1508   UROBILINOGEN 0.2 03/25/2012 1354   NITRITE NEGATIVE 07/10/2021 1605   LEUKOCYTESUR SMALL (A) 07/10/2021 1605   Sepsis Labs: Invalid input(s): PROCALCITONIN, LACTICIDVEN   Time coordinating discharge: 55 minutes  SIGNED:  [name], MD  [facility] 07/13/2021, 3:00 PM

## 2021-07-13 NOTE — Consult Note (Signed)
[facility] Inpatient Consult   07/13/2021  NELLE SAYED December 28, 1945 [id number]  [facility] CM)   Patient chart reviewed with noted extreme high risk score for unplanned readmission and 30 day unplanned readmission. Assessed for post hospital chronic care management needs. Per review, patient currently active with chronic care management team at primary provider office for chronic disease management and care coordination services.   Plan: Follow up with embedded CCM team.  Of note, [facility] Care Management services does not replace or interfere with any services that are arranged by inpatient case management or social work.   Netta Cedars, MSN, RN [facility] [employer] [phone]  Toll free office [phone]

## 2021-07-13 NOTE — TOC Initial Note (Addendum)
Transition of Care [facility]) - Initial/Assessment Note   Patient Details  Name: LUPITA ROSALES MRN: [id number] Date of Birth: April 23, 1946  Transition of Care [facility]) CM/SW Contact:    Sherie Don, LCSW Phone Number: 07/13/2021, 9:51 AM  Clinical Narrative: Readmission checklist completed due to high readmission score. CSW met with patient to complete assessment. Per patient, she lives at home with her husband who assists with transportation to medical appointments. Patient requires some assistance with ADLs at baseline. Patient is able to afford her monthly medications and reports she takes these as prescribed. Current DME includes a cane, BSC, and rolling walker. Patient is active with [facility] for [facility] and [place]. TOC to follow.  Addendum: Patient will discharge home today. HH orders are in. CSW updated [place] with [facility].  Expected Discharge Plan: [place] Barriers to Discharge: Continued Medical Work up  Patient Goals and CMS Choice Patient states their goals for this hospitalization and ongoing recovery are:: Return home with [facility] through [facility] CMS Medicare.gov Compare Post Acute Care list provided to:: Patient Choice offered to / list presented to : Patient  Expected Discharge Plan and Services Expected Discharge Plan: [place] In-house Referral: Clinical Social Work Post Acute Care Choice: [place] arrangements for the past 2 months: [place] Expected Discharge Date: 07/13/21               DME Arranged: N/A DME Agency: NA HH Arranged: PT, OT HH Agency: [facility] ([facility])  Prior Living Arrangements/Services Living arrangements for the past 2 months: Single Family Home Lives with:: Spouse Patient language and need for interpreter reviewed:: Yes Do you feel safe going back to the place where you live?: Yes      Need for Family Participation in Patient Care: No (Comment) Care giver support system in place?:  Yes (comment) Current home services: DME (Cane, rolling walker, BSC) Criminal Activity/Legal Involvement Pertinent to Current Situation/Hospitalization: No - Comment as needed  Activities of Daily Living Home Assistive Devices/Equipment: Cane (specify quad or straight) ADL Screening (condition at time of admission) Patient's cognitive ability adequate to safely complete daily activities?: Yes Is the patient deaf or have difficulty hearing?: No Does the patient have difficulty seeing, even when wearing glasses/contacts?: No Does the patient have difficulty concentrating, remembering, or making decisions?: No Patient able to express need for assistance with ADLs?: Yes Does the patient have difficulty dressing or bathing?: No Independently performs ADLs?: Yes (appropriate for developmental age) Does the patient have difficulty walking or climbing stairs?: No Weakness of Legs: Both Weakness of Arms/Hands: None  Emotional Assessment Appearance:: Appears stated age Attitude/Demeanor/Rapport: Engaged Affect (typically observed): Accepting Orientation: : Oriented to Self, Oriented to Place, Oriented to  Time, Oriented to Situation Alcohol / Substance Use: Not Applicable Psych Involvement: No (comment)  Admission diagnosis:  Acute GI bleeding [K92.2] Lower GI bleed [K92.2] Severe comorbid illness [R69] Patient Active Problem List   Diagnosis Date Noted   Acute GI bleeding 07/08/2021   Azotemia 07/08/2021   Pressure injury of skin 07/08/2021   Asthma, chronic, unspecified asthma severity, with acute exacerbation 06/15/2021   Acute renal failure ([place]) 06/15/2021   Chronic combined systolic and diastolic CHF (congestive heart failure) ([place]) 06/15/2021   Acute respiratory failure with hypoxia ([place]) 06/14/2021   Duodenal ulcer with hemorrhage 03/29/2021   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   Gastroesophageal reflux disease 06/29/2020   Iron deficiency anemia 06/29/2020   SOB  (shortness  of breath) 03/11/2020   Pneumonia due to COVID-19 virus 03/11/2020   Traumatic seroma of lower leg, right, subsequent encounter 01/28/2020   Other secondary pulmonary hypertension ([place]) 12/03/2019   Medication management 11/04/2019   Acute on chronic combined systolic and diastolic congestive heart failure ([place]) 10/22/2019   S/P AV nodal ablation 10/08/2018   Cardiac pacemaker in situ 04/18/2018   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   Personal history of PE (pulmonary embolism) 76/72/0947   Chronic systolic heart failure ([place]) 02/11/2018   Hyperkalemia 02/03/2018   Lower extremity edema 12/10/2017   Presence of Watchman left atrial appendage closure device 10/04/2017   Chronic respiratory failure with hypoxia ([place]) 10/03/2017   Dyspnea 07/26/2017   Atrial flutter ([place])    Peripheral vascular disease ([place])    Oligouria    Hypertension    Fractures, stress    Eczema    Depression    Constipation    Arthritis    Exacerbation of asthma 04/01/2017   Closed fracture of right distal radius 10/18/2016   Proteinuria 09/04/2016   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/25/2015   Immunosuppression ([place]) 07/25/2015   Nonischemic dilated cardiomyopathy ([place])    S/P insertion of IVC (inferior vena caval) filter 04/07/2013   Use of cane as ambulatory aid 04/07/2013   S/P hip replacement 03/25/2012   DVT of lower extremity, bilateral ([place]) 12/21/2011   Personal history of DVT (deep vein thrombosis) 12/08/2011   Obstructive sleep apnea 12/08/2011   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[place]) 10/24/2011   Pseudomonas aeruginosa infection 10/24/2011   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 ([place]) 09/14/2011   Hypercalcemia    Gout 09/10/2011   Absence of right hip joint with antibiotic pacer 09/03/2011   Traumatic seroma of thigh ([place]) 06/11/2011   Clostridium difficile colitis 06/09/2011   Leukocytosis 06/08/2011   Symptomatic anemia 06/06/2011   SINUSITIS-  ACUTE-NOS 09/27/2010   PAF (paroxysmal atrial fibrillation) ([place]) 11/25/2008   CVA 09/28/2008   History of CVA with residual deficit 09/28/2008   Hemiplegia ([place]) 09/21/2008   Psoriasis 10/20/2007   Asthmatic bronchitis without complication 09/62/8366   PCP:  [name], NP Pharmacy:   [id number] [place] [place] [address]Phone: [phone]: [phone] [address] [place]Phone: [phone]: [phone] Readmission Risk Interventions Readmission Risk Prevention Plan 07/13/2021 11/18/2019  Transportation Screening Complete Complete  PCP or Specialist Appt within 3-5 Days - Complete  HRI or [place]- Complete  Social Work Consult for [place]/Counseling - Complete  Palliative Care Screening - Not Applicable  Medication Review (Press photographer Complete Complete  HRI or Home Care Consult Complete -  SW Recovery Care/Counseling Consult Complete -  Palliative Care Screening Not Applicable -  SBryanNot Applicable -  Some recent data might be hidden

## 2021-07-13 NOTE — Progress Notes (Signed)
Patient arrived from 4E accompanied by RN. Pt stable not appearing to be in acute distress. Pt oriented to room. Will continue to assess pt.

## 2021-07-14 ENCOUNTER — Telehealth: Payer: Self-pay | Admitting: Nurse Practitioner

## 2021-07-14 ENCOUNTER — Encounter: Payer: Self-pay | Admitting: Gastroenterology

## 2021-07-14 DIAGNOSIS — N184 Chronic kidney disease, stage 4 (severe): Secondary | ICD-10-CM | POA: Diagnosis not present

## 2021-07-14 DIAGNOSIS — I130 Hypertensive heart and chronic kidney disease with heart failure and stage 1 through stage 4 chronic kidney disease, or unspecified chronic kidney disease: Secondary | ICD-10-CM | POA: Diagnosis not present

## 2021-07-14 DIAGNOSIS — D631 Anemia in chronic kidney disease: Secondary | ICD-10-CM | POA: Diagnosis not present

## 2021-07-14 DIAGNOSIS — J45901 Unspecified asthma with (acute) exacerbation: Secondary | ICD-10-CM | POA: Diagnosis not present

## 2021-07-14 DIAGNOSIS — I5042 Chronic combined systolic (congestive) and diastolic (congestive) heart failure: Secondary | ICD-10-CM | POA: Diagnosis not present

## 2021-07-14 DIAGNOSIS — I4819 Other persistent atrial fibrillation: Secondary | ICD-10-CM | POA: Diagnosis not present

## 2021-07-14 LAB — HSV(HERPES SIMPLEX VRS) I + II AB-IGG
HSV 1 Glycoprotein G Ab, IgG: 26.3 index — ABNORMAL HIGH (ref 0.00–0.90)
HSV 2 Glycoprotein G Ab, IgG: 8.38 index — ABNORMAL HIGH (ref 0.00–0.90)

## 2021-07-14 LAB — SURGICAL PATHOLOGY

## 2021-07-14 LAB — COXSACKIE A VIRUS ANTIBODIES
Coxsackie A16 IgG: NEGATIVE titer
Coxsackie A16 IgM: NEGATIVE titer
Coxsackie A24 IgG: NEGATIVE titer
Coxsackie A24 IgM: NEGATIVE titer
Coxsackie A7 IgG: NEGATIVE titer
Coxsackie A7 IgM: NEGATIVE titer
Coxsackie A9 IgG: NEGATIVE titer
Coxsackie A9 IgM: NEGATIVE titer

## 2021-07-14 NOTE — Telephone Encounter (Signed)
Laural Golden (pts daughter) called and said that pt was discharged from the yesterday and she said they need some help at home and she wanted to know if a home health referral can be put in. She said the best person to call for more info will be [name] [phone]. Please advise

## 2021-07-18 ENCOUNTER — Encounter (HOSPITAL_COMMUNITY)
Admission: RE | Admit: 2021-07-18 | Discharge: 2021-07-18 | Disposition: A | Discharge status: Home / Self Care | Payer: Medicare Other | Source: Ambulatory Visit | Attending: Nephrology | Admitting: Nephrology

## 2021-07-18 ENCOUNTER — Other Ambulatory Visit: Payer: Self-pay

## 2021-07-18 VITALS — BP 125/70 | HR 71 | Temp 97.6°F

## 2021-07-18 DIAGNOSIS — I120 Hypertensive chronic kidney disease with stage 5 chronic kidney disease or end stage renal disease: Secondary | ICD-10-CM | POA: Insufficient documentation

## 2021-07-18 DIAGNOSIS — N185 Chronic kidney disease, stage 5: Secondary | ICD-10-CM | POA: Diagnosis not present

## 2021-07-18 LAB — IRON AND TIBC
Iron: 60 ug/dL (ref 28–170)
Saturation Ratios: 36 % — ABNORMAL HIGH (ref 10.4–31.8)
TIBC: 168 ug/dL — ABNORMAL LOW (ref 250–450)
UIBC: 108 ug/dL

## 2021-07-18 LAB — FERRITIN: Ferritin: 428 ng/mL — ABNORMAL HIGH (ref 11–307)

## 2021-07-18 LAB — POCT HEMOGLOBIN-HEMACUE: Hemoglobin: 9.2 g/dL — ABNORMAL LOW (ref 12.0–15.0)

## 2021-07-18 MED ORDER — EPOETIN ALFA-EPBX 10000 UNIT/ML IJ SOLN
10000.0000 [IU] | INTRAMUSCULAR | Status: DC
  Administered 2021-07-18: 10000 [IU] via SUBCUTANEOUS

## 2021-07-18 MED ORDER — EPOETIN ALFA-EPBX 10000 UNIT/ML IJ SOLN
INTRAMUSCULAR | Status: AC
  Filled 2021-07-18: qty 1

## 2021-07-19 DIAGNOSIS — D631 Anemia in chronic kidney disease: Secondary | ICD-10-CM | POA: Diagnosis not present

## 2021-07-19 DIAGNOSIS — I4819 Other persistent atrial fibrillation: Secondary | ICD-10-CM | POA: Diagnosis not present

## 2021-07-19 DIAGNOSIS — I130 Hypertensive heart and chronic kidney disease with heart failure and stage 1 through stage 4 chronic kidney disease, or unspecified chronic kidney disease: Secondary | ICD-10-CM | POA: Diagnosis not present

## 2021-07-19 DIAGNOSIS — N184 Chronic kidney disease, stage 4 (severe): Secondary | ICD-10-CM | POA: Diagnosis not present

## 2021-07-19 DIAGNOSIS — I5042 Chronic combined systolic (congestive) and diastolic (congestive) heart failure: Secondary | ICD-10-CM | POA: Diagnosis not present

## 2021-07-19 DIAGNOSIS — J45901 Unspecified asthma with (acute) exacerbation: Secondary | ICD-10-CM | POA: Diagnosis not present

## 2021-07-21 ENCOUNTER — Other Ambulatory Visit: Payer: Self-pay

## 2021-07-21 ENCOUNTER — Ambulatory Visit (INDEPENDENT_AMBULATORY_CARE_PROVIDER_SITE_OTHER): Payer: Medicare Other | Admitting: Nurse Practitioner

## 2021-07-21 ENCOUNTER — Other Ambulatory Visit: Payer: Self-pay | Admitting: Nurse Practitioner

## 2021-07-21 VITALS — BP 112/68 | HR 70 | Temp 97.0°F | Ht 65.5 in | Wt 123.2 lb

## 2021-07-21 DIAGNOSIS — I5042 Chronic combined systolic (congestive) and diastolic (congestive) heart failure: Secondary | ICD-10-CM | POA: Diagnosis not present

## 2021-07-21 DIAGNOSIS — F331 Major depressive disorder, recurrent, moderate: Secondary | ICD-10-CM | POA: Diagnosis not present

## 2021-07-21 DIAGNOSIS — J45901 Unspecified asthma with (acute) exacerbation: Secondary | ICD-10-CM | POA: Diagnosis not present

## 2021-07-21 DIAGNOSIS — I130 Hypertensive heart and chronic kidney disease with heart failure and stage 1 through stage 4 chronic kidney disease, or unspecified chronic kidney disease: Secondary | ICD-10-CM | POA: Diagnosis not present

## 2021-07-21 DIAGNOSIS — D631 Anemia in chronic kidney disease: Secondary | ICD-10-CM | POA: Diagnosis not present

## 2021-07-21 DIAGNOSIS — K922 Gastrointestinal hemorrhage, unspecified: Secondary | ICD-10-CM | POA: Diagnosis not present

## 2021-07-21 DIAGNOSIS — N184 Chronic kidney disease, stage 4 (severe): Secondary | ICD-10-CM | POA: Diagnosis not present

## 2021-07-21 DIAGNOSIS — I4819 Other persistent atrial fibrillation: Secondary | ICD-10-CM | POA: Diagnosis not present

## 2021-07-21 LAB — CBC WITH DIFFERENTIAL/PLATELET
Basophils Absolute: 0.1 10*3/uL (ref 0.0–0.1)
Basophils Relative: 0.9 % (ref 0.0–3.0)
Eosinophils Absolute: 0 10*3/uL (ref 0.0–0.7)
Eosinophils Relative: 0.3 % (ref 0.0–5.0)
HCT: 28.9 % — ABNORMAL LOW (ref 36.0–46.0)
Hemoglobin: 9.1 g/dL — ABNORMAL LOW (ref 12.0–15.0)
Lymphocytes Relative: 9.5 % — ABNORMAL LOW (ref 12.0–46.0)
Lymphs Abs: 0.6 10*3/uL — ABNORMAL LOW (ref 0.7–4.0)
MCHC: 31.6 g/dL (ref 30.0–36.0)
MCV: 85.1 fl (ref 78.0–100.0)
Monocytes Absolute: 0.7 10*3/uL (ref 0.1–1.0)
Monocytes Relative: 10.2 % (ref 3.0–12.0)
Neutro Abs: 5 10*3/uL (ref 1.4–7.7)
Neutrophils Relative %: 79.1 % — ABNORMAL HIGH (ref 43.0–77.0)
Platelets: 222 10*3/uL (ref 150.0–400.0)
RBC: 3.4 Mil/uL — ABNORMAL LOW (ref 3.87–5.11)
RDW: 20.4 % — ABNORMAL HIGH (ref 11.5–15.5)
WBC: 6.4 10*3/uL (ref 4.0–10.5)

## 2021-07-21 LAB — BASIC METABOLIC PANEL
BUN: 37 mg/dL — ABNORMAL HIGH (ref 6–23)
CO2: 22 mEq/L (ref 19–32)
Calcium: 8.4 mg/dL (ref 8.4–10.5)
Chloride: 109 mEq/L (ref 96–112)
Creatinine, Ser: 3.25 mg/dL — ABNORMAL HIGH (ref 0.40–1.20)
GFR: 13.63 mL/min — CL (ref >60.00)
Glucose, Bld: 105 mg/dL — ABNORMAL HIGH (ref 70–99)
Potassium: 3.1 mEq/L — ABNORMAL LOW (ref 3.5–5.1)
Sodium: 140 mEq/L (ref 135–145)

## 2021-07-21 LAB — MAGNESIUM: Magnesium: 1.5 mg/dL (ref 1.5–2.5)

## 2021-07-21 MED ORDER — DULOXETINE HCL 40 MG PO CPEP: 40.0000 mg | ORAL_CAPSULE | Freq: Every day | ORAL | 1 refills | Status: DC

## 2021-07-21 NOTE — Assessment & Plan Note (Signed)
No improvement with cymbalta 30mg  Has not yet scheduled appt with therapist.  Agreed to increased cymbalta to 40mg  and schedule appt with therapist F/up in 33month

## 2021-07-21 NOTE — Patient Instructions (Addendum)
Dr. Valarie Merino Mansouraty with [facility] [phone].  Schedule appt with therapist.  Increase cymbalta to 40mg  F/up in 61month

## 2021-07-21 NOTE — Progress Notes (Signed)
Subjective:  Patient ID: [name], female    DOB: May 22, 1946  Age: 76 y.o. MRN: [id number]  CC: Hospitalization Follow-up (Pt was discharged from the hospital on 07/13/21 and states she has good days and bad days since leaving the hospital. Pt states she has been very fatigued and when she does do something she gets tired fast. )  HPI Accompanied by her husband.  Acute GI bleeding Resolved Capsule endoscopy 07/20/21: no active bleeding Colonoscopy: inconclusive due to poor prep. recommended serial cbc, tagged RBC  vs VCE+colonoscopy. Repeat cbc today  Depression No improvement with cymbalta 30mg  Has not yet scheduled appt with therapist.  Agreed to increased cymbalta to 40mg  and schedule appt with therapist F/up in 52month  She Appt with Dr. Collene Mares (GI) 08/17/21, Nephrology appt 08/07/21 Home OT and PT initiated. She is making arrangement for personal care assistant.  Wound on sacrum: healed. Skin intact.  Wt Readings from Last 3 Encounters:  07/21/21 123 lb 3.2 oz (55.9 kg)  07/11/21 115 lb 11.9 oz (52.5 kg)  06/21/21 126 lb 3.2 oz (57.2 kg)    BP Readings from Last 3 Encounters:  07/21/21 112/68  07/18/21 125/70  07/13/21 139/81    Reviewed past Medical, Social and Family history today.  Outpatient Medications Prior to Visit  Medication Sig Dispense Refill   acetaminophen (TYLENOL) 500 MG tablet Take 500 mg by mouth every 6 (six) hours as needed for mild pain or headache.      albuterol (PROVENTIL) (2.5 MG/3ML) 0.083% nebulizer solution Take 3 mLs (2.5 mg total) by nebulization every 6 (six) hours as needed for wheezing or shortness of breath. Dx:J45.909 150 mL 2   budesonide-formoterol (SYMBICORT) 160-4.5 MCG/ACT inhaler Inhale 2 puffs into the lungs 2 (two) times daily. 1 each 5   carvedilol (COREG) 6.25 MG tablet Take 6.25 mg by mouth 2 (two) times daily.     docusate sodium (COLACE) 100 MG capsule Take 100 mg by mouth at bedtime as needed for mild constipation.      mycophenolate (MYFORTIC) 180 MG EC tablet Take 180 mg by mouth 2 (two) times daily.     ondansetron (ZOFRAN) 4 MG tablet Take 1 tablet (4 mg total) by mouth every 8 (eight) hours as needed for up to 9 doses for nausea. 9 tablet 0   pantoprazole (PROTONIX) 40 MG tablet Take 1 tablet (40 mg total) by mouth 2 (two) times daily for 30 days, THEN 1 tablet (40 mg total) daily. 120 tablet 0   predniSONE (DELTASONE) 5 MG tablet Take 5 mg by mouth daily.     sodium bicarbonate 650 MG tablet Take 1 tablet (650 mg total) by mouth 2 (two) times daily. 60 tablet 0   sucralfate (CARAFATE) 1 g tablet Take 1 tablet (1 g total) by mouth 4 (four) times daily. 120 tablet 0   tacrolimus (PROGRAF) 1 MG capsule Take 1 mg by mouth 2 (two) times daily.     DULoxetine (CYMBALTA) 30 MG capsule TAKE 1 CAPSULE BY MOUTH EVERY DAY (Patient taking differently: Take 30 mg by mouth daily.) 90 capsule 3   albuterol (VENTOLIN HFA) 108 (90 Base) MCG/ACT inhaler Inhale 1-2 puffs into the lungs every 6 (six) hours as needed for wheezing or shortness of breath. (Patient not taking: Reported on 07/08/2021) 8 g 1   No facility-administered medications prior to visit.    ROS See HPI  Objective:  BP 112/68 (BP Location: Right Arm, Patient Position: Sitting, Cuff Size: Normal)  Pulse 70    Temp (!) 97 F (36.1 C) (Temporal)    Ht 5' 5.5" (1.664 m)    Wt 123 lb 3.2 oz (55.9 kg)    SpO2 99%    BMI 20.19 kg/m   Physical Exam Vitals reviewed.  Cardiovascular:     Rate and Rhythm: Normal rate and regular rhythm.     Pulses: Normal pulses.     Heart sounds: Normal heart sounds.  Pulmonary:     Effort: Pulmonary effort is normal.     Breath sounds: Normal breath sounds.  Abdominal:     General: Bowel sounds are normal.     Palpations: Abdomen is soft.  Musculoskeletal:     Right lower leg: No edema.     Left lower leg: No edema.  Skin:    Findings: No erythema, lesion or rash.     Comments: Healed pressure ulcer on sacrum   Neurological:     Mental Status: She is alert and oriented to person, place, and time.  Psychiatric:        Attention and Perception: Attention normal.        Mood and Affect: Mood is depressed.        Speech: Speech normal.        Behavior: Behavior is cooperative.        Thought Content: Thought content normal.        Cognition and Memory: Cognition and memory normal.    Assessment & Plan:  This visit occurred during the SARS-CoV-2 public health emergency.  Safety protocols were in place, including screening questions prior to the visit, additional usage of staff PPE, and extensive cleaning of exam room while observing appropriate contact time as indicated for disinfecting solutions.   Saudia was seen today for hospitalization follow-up.  Diagnoses and all orders for this visit:  Acute GI bleeding -     CBC with Differential/Platelet -     Basic metabolic panel -     Magnesium  Moderate episode of recurrent major depressive disorder (HCC) -     DULoxetine 40 MG CPEP; Take 40 mg by mouth daily. TAKE 1 CAPSULE BY MOUTH EVERY DAY    Problem List Items Addressed This Visit       Digestive   Acute GI bleeding - Primary    Resolved Capsule endoscopy 07/20/21: no active bleeding Colonoscopy: inconclusive due to poor prep. recommended serial cbc, tagged RBC  vs VCE+colonoscopy. Repeat cbc today      Relevant Orders   CBC with Differential/Platelet   Basic metabolic panel   Magnesium     Other   Depression    No improvement with cymbalta 30mg  Has not yet scheduled appt with therapist.  Agreed to increased cymbalta to 40mg  and schedule appt with therapist F/up in 43month      Relevant Medications   DULoxetine 40 MG CPEP    Follow-up: Return in about 4 weeks (around 08/18/2021) for depression and anxiety.  Wilfred Lacy, NP

## 2021-07-21 NOTE — Assessment & Plan Note (Addendum)
Resolved Capsule endoscopy 07/20/21: no active bleeding Colonoscopy: inconclusive due to poor prep. recommended serial cbc, tagged RBC  vs VCE+colonoscopy. Repeat cbc today

## 2021-07-23 ENCOUNTER — Encounter (HOSPITAL_COMMUNITY): Payer: Self-pay | Admitting: Emergency Medicine

## 2021-07-23 ENCOUNTER — Other Ambulatory Visit: Payer: Self-pay

## 2021-07-23 ENCOUNTER — Emergency Department (HOSPITAL_BASED_OUTPATIENT_CLINIC_OR_DEPARTMENT_OTHER): Payer: Medicare Other

## 2021-07-23 ENCOUNTER — Emergency Department (HOSPITAL_COMMUNITY): Payer: Medicare Other

## 2021-07-23 ENCOUNTER — Inpatient Hospital Stay (HOSPITAL_COMMUNITY)
Admission: EM | Admit: 2021-07-23 | Discharge: 2021-07-26 | DRG: 065 | Disposition: A | Discharge status: Skilled Nursing Facility | Payer: Medicare Other | Attending: Internal Medicine | Admitting: Internal Medicine

## 2021-07-23 DIAGNOSIS — R600 Localized edema: Secondary | ICD-10-CM | POA: Diagnosis not present

## 2021-07-23 DIAGNOSIS — R471 Dysarthria and anarthria: Secondary | ICD-10-CM | POA: Diagnosis present

## 2021-07-23 DIAGNOSIS — Z5181 Encounter for therapeutic drug level monitoring: Secondary | ICD-10-CM | POA: Diagnosis not present

## 2021-07-23 DIAGNOSIS — N186 End stage renal disease: Secondary | ICD-10-CM | POA: Diagnosis not present

## 2021-07-23 DIAGNOSIS — Z20822 Contact with and (suspected) exposure to covid-19: Secondary | ICD-10-CM | POA: Diagnosis present

## 2021-07-23 DIAGNOSIS — R29898 Other symptoms and signs involving the musculoskeletal system: Secondary | ICD-10-CM

## 2021-07-23 DIAGNOSIS — Z940 Kidney transplant status: Secondary | ICD-10-CM | POA: Diagnosis not present

## 2021-07-23 DIAGNOSIS — E7800 Pure hypercholesterolemia, unspecified: Secondary | ICD-10-CM | POA: Diagnosis present

## 2021-07-23 DIAGNOSIS — F32A Depression, unspecified: Secondary | ICD-10-CM | POA: Diagnosis present

## 2021-07-23 DIAGNOSIS — R6889 Other general symptoms and signs: Secondary | ICD-10-CM | POA: Diagnosis not present

## 2021-07-23 DIAGNOSIS — I5042 Chronic combined systolic (congestive) and diastolic (congestive) heart failure: Secondary | ICD-10-CM | POA: Diagnosis present

## 2021-07-23 DIAGNOSIS — T8619 Other complication of kidney transplant: Secondary | ICD-10-CM | POA: Diagnosis present

## 2021-07-23 DIAGNOSIS — Z95828 Presence of other vascular implants and grafts: Secondary | ICD-10-CM

## 2021-07-23 DIAGNOSIS — I10 Essential (primary) hypertension: Secondary | ICD-10-CM | POA: Diagnosis not present

## 2021-07-23 DIAGNOSIS — Z7401 Bed confinement status: Secondary | ICD-10-CM | POA: Diagnosis not present

## 2021-07-23 DIAGNOSIS — I4819 Other persistent atrial fibrillation: Secondary | ICD-10-CM | POA: Diagnosis present

## 2021-07-23 DIAGNOSIS — I6381 Other cerebral infarction due to occlusion or stenosis of small artery: Secondary | ICD-10-CM | POA: Diagnosis not present

## 2021-07-23 DIAGNOSIS — M6281 Muscle weakness (generalized): Secondary | ICD-10-CM | POA: Diagnosis not present

## 2021-07-23 DIAGNOSIS — I6930 Unspecified sequelae of cerebral infarction: Secondary | ICD-10-CM | POA: Diagnosis not present

## 2021-07-23 DIAGNOSIS — R5381 Other malaise: Secondary | ICD-10-CM | POA: Diagnosis not present

## 2021-07-23 DIAGNOSIS — F331 Major depressive disorder, recurrent, moderate: Secondary | ICD-10-CM

## 2021-07-23 DIAGNOSIS — Y830 Surgical operation with transplant of whole organ as the cause of abnormal reaction of the patient, or of later complication, without mention of misadventure at the time of the procedure: Secondary | ICD-10-CM | POA: Diagnosis present

## 2021-07-23 DIAGNOSIS — Z950 Presence of cardiac pacemaker: Secondary | ICD-10-CM

## 2021-07-23 DIAGNOSIS — I69351 Hemiplegia and hemiparesis following cerebral infarction affecting right dominant side: Secondary | ICD-10-CM | POA: Diagnosis not present

## 2021-07-23 DIAGNOSIS — G459 Transient cerebral ischemic attack, unspecified: Secondary | ICD-10-CM | POA: Diagnosis not present

## 2021-07-23 DIAGNOSIS — K1230 Oral mucositis (ulcerative), unspecified: Secondary | ICD-10-CM | POA: Diagnosis not present

## 2021-07-23 DIAGNOSIS — I739 Peripheral vascular disease, unspecified: Secondary | ICD-10-CM | POA: Diagnosis present

## 2021-07-23 DIAGNOSIS — N179 Acute kidney failure, unspecified: Secondary | ICD-10-CM | POA: Diagnosis present

## 2021-07-23 DIAGNOSIS — M7989 Other specified soft tissue disorders: Secondary | ICD-10-CM

## 2021-07-23 DIAGNOSIS — R29705 NIHSS score 5: Secondary | ICD-10-CM | POA: Diagnosis present

## 2021-07-23 DIAGNOSIS — Z86718 Personal history of other venous thrombosis and embolism: Secondary | ICD-10-CM | POA: Diagnosis not present

## 2021-07-23 DIAGNOSIS — Z79899 Other long term (current) drug therapy: Secondary | ICD-10-CM

## 2021-07-23 DIAGNOSIS — N185 Chronic kidney disease, stage 5: Secondary | ICD-10-CM | POA: Diagnosis not present

## 2021-07-23 DIAGNOSIS — Z888 Allergy status to other drugs, medicaments and biological substances status: Secondary | ICD-10-CM

## 2021-07-23 DIAGNOSIS — R4781 Slurred speech: Secondary | ICD-10-CM | POA: Diagnosis not present

## 2021-07-23 DIAGNOSIS — Z743 Need for continuous supervision: Secondary | ICD-10-CM | POA: Diagnosis not present

## 2021-07-23 DIAGNOSIS — I69828 Other speech and language deficits following other cerebrovascular disease: Secondary | ICD-10-CM | POA: Diagnosis not present

## 2021-07-23 DIAGNOSIS — D62 Acute posthemorrhagic anemia: Secondary | ICD-10-CM | POA: Diagnosis present

## 2021-07-23 DIAGNOSIS — I480 Paroxysmal atrial fibrillation: Secondary | ICD-10-CM | POA: Diagnosis present

## 2021-07-23 DIAGNOSIS — R4701 Aphasia: Secondary | ICD-10-CM | POA: Diagnosis present

## 2021-07-23 DIAGNOSIS — J4540 Moderate persistent asthma, uncomplicated: Secondary | ICD-10-CM | POA: Diagnosis not present

## 2021-07-23 DIAGNOSIS — G8191 Hemiplegia, unspecified affecting right dominant side: Secondary | ICD-10-CM | POA: Diagnosis present

## 2021-07-23 DIAGNOSIS — I420 Dilated cardiomyopathy: Secondary | ICD-10-CM | POA: Diagnosis present

## 2021-07-23 DIAGNOSIS — I639 Cerebral infarction, unspecified: Secondary | ICD-10-CM

## 2021-07-23 DIAGNOSIS — G4733 Obstructive sleep apnea (adult) (pediatric): Secondary | ICD-10-CM | POA: Diagnosis not present

## 2021-07-23 DIAGNOSIS — J45909 Unspecified asthma, uncomplicated: Secondary | ICD-10-CM | POA: Diagnosis present

## 2021-07-23 DIAGNOSIS — K269 Duodenal ulcer, unspecified as acute or chronic, without hemorrhage or perforation: Secondary | ICD-10-CM | POA: Diagnosis present

## 2021-07-23 DIAGNOSIS — R54 Age-related physical debility: Secondary | ICD-10-CM | POA: Diagnosis present

## 2021-07-23 DIAGNOSIS — I482 Chronic atrial fibrillation, unspecified: Secondary | ICD-10-CM

## 2021-07-23 DIAGNOSIS — I6389 Other cerebral infarction: Secondary | ICD-10-CM | POA: Diagnosis not present

## 2021-07-23 DIAGNOSIS — Z8249 Family history of ischemic heart disease and other diseases of the circulatory system: Secondary | ICD-10-CM

## 2021-07-23 DIAGNOSIS — I5032 Chronic diastolic (congestive) heart failure: Secondary | ICD-10-CM | POA: Diagnosis not present

## 2021-07-23 DIAGNOSIS — Z9889 Other specified postprocedural states: Secondary | ICD-10-CM

## 2021-07-23 DIAGNOSIS — L89152 Pressure ulcer of sacral region, stage 2: Secondary | ICD-10-CM | POA: Diagnosis not present

## 2021-07-23 DIAGNOSIS — R29810 Facial weakness: Secondary | ICD-10-CM | POA: Diagnosis present

## 2021-07-23 DIAGNOSIS — Z91199 Patient's noncompliance with other medical treatment and regimen due to unspecified reason: Secondary | ICD-10-CM | POA: Diagnosis not present

## 2021-07-23 DIAGNOSIS — I499 Cardiac arrhythmia, unspecified: Secondary | ICD-10-CM | POA: Diagnosis not present

## 2021-07-23 DIAGNOSIS — I132 Hypertensive heart and chronic kidney disease with heart failure and with stage 5 chronic kidney disease, or end stage renal disease: Secondary | ICD-10-CM | POA: Diagnosis present

## 2021-07-23 DIAGNOSIS — M2550 Pain in unspecified joint: Secondary | ICD-10-CM | POA: Diagnosis not present

## 2021-07-23 DIAGNOSIS — Z8673 Personal history of transient ischemic attack (TIA), and cerebral infarction without residual deficits: Secondary | ICD-10-CM

## 2021-07-23 DIAGNOSIS — K2210 Ulcer of esophagus without bleeding: Secondary | ICD-10-CM | POA: Diagnosis present

## 2021-07-23 DIAGNOSIS — Z86711 Personal history of pulmonary embolism: Secondary | ICD-10-CM

## 2021-07-23 DIAGNOSIS — E876 Hypokalemia: Secondary | ICD-10-CM | POA: Diagnosis present

## 2021-07-23 DIAGNOSIS — R531 Weakness: Secondary | ICD-10-CM | POA: Diagnosis not present

## 2021-07-23 DIAGNOSIS — Z9181 History of falling: Secondary | ICD-10-CM | POA: Diagnosis not present

## 2021-07-23 DIAGNOSIS — M109 Gout, unspecified: Secondary | ICD-10-CM | POA: Diagnosis present

## 2021-07-23 DIAGNOSIS — R41841 Cognitive communication deficit: Secondary | ICD-10-CM | POA: Diagnosis not present

## 2021-07-23 DIAGNOSIS — Z96641 Presence of right artificial hip joint: Secondary | ICD-10-CM | POA: Diagnosis present

## 2021-07-23 DIAGNOSIS — R2689 Other abnormalities of gait and mobility: Secondary | ICD-10-CM | POA: Diagnosis not present

## 2021-07-23 DIAGNOSIS — I120 Hypertensive chronic kidney disease with stage 5 chronic kidney disease or end stage renal disease: Secondary | ICD-10-CM | POA: Diagnosis not present

## 2021-07-23 DIAGNOSIS — I69328 Other speech and language deficits following cerebral infarction: Secondary | ICD-10-CM | POA: Diagnosis not present

## 2021-07-23 DIAGNOSIS — I5043 Acute on chronic combined systolic (congestive) and diastolic (congestive) heart failure: Secondary | ICD-10-CM | POA: Diagnosis present

## 2021-07-23 DIAGNOSIS — R9431 Abnormal electrocardiogram [ECG] [EKG]: Secondary | ICD-10-CM | POA: Diagnosis not present

## 2021-07-23 DIAGNOSIS — I6340 Cerebral infarction due to embolism of unspecified cerebral artery: Secondary | ICD-10-CM | POA: Diagnosis present

## 2021-07-23 DIAGNOSIS — Z7901 Long term (current) use of anticoagulants: Secondary | ICD-10-CM | POA: Diagnosis not present

## 2021-07-23 DIAGNOSIS — Z7951 Long term (current) use of inhaled steroids: Secondary | ICD-10-CM

## 2021-07-23 LAB — CBC
HCT: 32 % — ABNORMAL LOW (ref 36.0–46.0)
Hemoglobin: 9.5 g/dL — ABNORMAL LOW (ref 12.0–15.0)
MCH: 26.5 pg (ref 26.0–34.0)
MCHC: 29.7 g/dL — ABNORMAL LOW (ref 30.0–36.0)
MCV: 89.4 fL (ref 80.0–100.0)
Platelets: 237 10*3/uL (ref 150–400)
RBC: 3.58 MIL/uL — ABNORMAL LOW (ref 3.87–5.11)
RDW: 20.8 % — ABNORMAL HIGH (ref 11.5–15.5)
WBC: 8.2 10*3/uL (ref 4.0–10.5)
nRBC: 0.2 % (ref 0.0–0.2)

## 2021-07-23 LAB — I-STAT CHEM 8, ED
BUN: 42 mg/dL — ABNORMAL HIGH (ref 8–23)
Calcium, Ion: 1.13 mmol/L — ABNORMAL LOW (ref 1.15–1.40)
Chloride: 111 mmol/L (ref 98–111)
Creatinine, Ser: 3.1 mg/dL — ABNORMAL HIGH (ref 0.44–1.00)
Glucose, Bld: 91 mg/dL (ref 70–99)
HCT: 33 % — ABNORMAL LOW (ref 36.0–46.0)
Hemoglobin: 11.2 g/dL — ABNORMAL LOW (ref 12.0–15.0)
Potassium: 3.7 mmol/L (ref 3.5–5.1)
Sodium: 141 mmol/L (ref 135–145)
TCO2: 21 mmol/L — ABNORMAL LOW (ref 22–32)

## 2021-07-23 LAB — COMPREHENSIVE METABOLIC PANEL
ALT: 10 U/L (ref 0–44)
AST: 47 U/L — ABNORMAL HIGH (ref 15–41)
Albumin: 2.4 g/dL — ABNORMAL LOW (ref 3.5–5.0)
Alkaline Phosphatase: 54 U/L (ref 38–126)
Anion gap: 8 (ref 5–15)
BUN: 31 mg/dL — ABNORMAL HIGH (ref 8–23)
CO2: 20 mmol/L — ABNORMAL LOW (ref 22–32)
Calcium: 8.2 mg/dL — ABNORMAL LOW (ref 8.9–10.3)
Chloride: 110 mmol/L (ref 98–111)
Creatinine, Ser: 3.09 mg/dL — ABNORMAL HIGH (ref 0.44–1.00)
GFR, Estimated: 15 mL/min — ABNORMAL LOW (ref >60)
Glucose, Bld: 101 mg/dL — ABNORMAL HIGH (ref 70–99)
Potassium: 4.5 mmol/L (ref 3.5–5.1)
Sodium: 138 mmol/L (ref 135–145)
Total Bilirubin: 1.6 mg/dL — ABNORMAL HIGH (ref 0.3–1.2)
Total Protein: 4.6 g/dL — ABNORMAL LOW (ref 6.5–8.1)

## 2021-07-23 LAB — DIFFERENTIAL
Abs Immature Granulocytes: 0.06 10*3/uL (ref 0.00–0.07)
Basophils Absolute: 0.1 10*3/uL (ref 0.0–0.1)
Basophils Relative: 1 %
Eosinophils Absolute: 0.1 10*3/uL (ref 0.0–0.5)
Eosinophils Relative: 1 %
Immature Granulocytes: 1 %
Lymphocytes Relative: 8 %
Lymphs Abs: 0.7 10*3/uL (ref 0.7–4.0)
Monocytes Absolute: 0.8 10*3/uL (ref 0.1–1.0)
Monocytes Relative: 10 %
Neutro Abs: 6.6 10*3/uL (ref 1.7–7.7)
Neutrophils Relative %: 79 %

## 2021-07-23 LAB — HEMOGLOBIN A1C
Hgb A1c MFr Bld: 4.7 % — ABNORMAL LOW (ref 4.8–5.6)
Mean Plasma Glucose: 88.19 mg/dL

## 2021-07-23 LAB — APTT: aPTT: 33 seconds (ref 24–36)

## 2021-07-23 LAB — RESP PANEL BY RT-PCR (FLU A&B, COVID) ARPGX2
Influenza A by PCR: NEGATIVE
Influenza B by PCR: NEGATIVE
SARS Coronavirus 2 by RT PCR: NEGATIVE

## 2021-07-23 LAB — TSH: TSH: 1.765 u[IU]/mL (ref 0.350–4.500)

## 2021-07-23 LAB — CBG MONITORING, ED: Glucose-Capillary: 78 mg/dL (ref 70–99)

## 2021-07-23 LAB — PROTIME-INR
INR: 1.1 (ref 0.8–1.2)
Prothrombin Time: 14.2 seconds (ref 11.4–15.2)

## 2021-07-23 IMAGING — CT CT HEAD CODE STROKE
4 series · 15 of 47 positions shown, 17 images · non-contrast
Comparison: Head CT without contrast [DATE].

CLINICAL DATA: Code stroke. 75-year-old female with left side
weakness.

EXAM:
CT HEAD WITHOUT CONTRAST
TECHNIQUE: Contiguous axial images were obtained from the base of the skull
through the vertex without intravenous contrast.

[Series 3: head wo · axial · 0.41mm/px · z∈[-90,+10]mm · 6 of 30 slices shown, 8 images]
[im 5/30  brain]
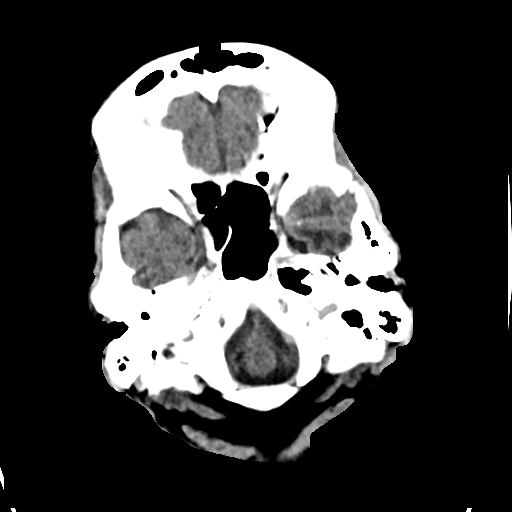
[im 5/30  bone]
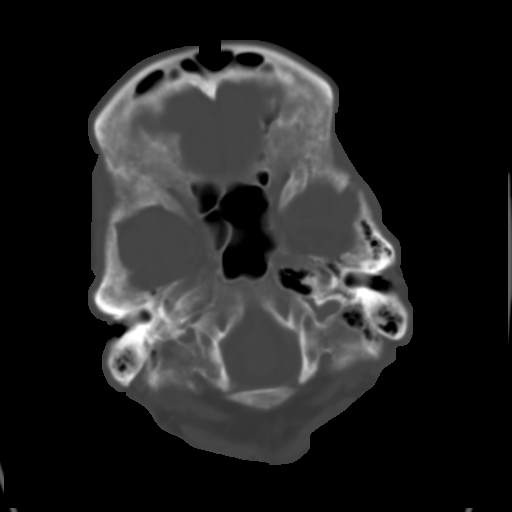
[im 9/30  brain]
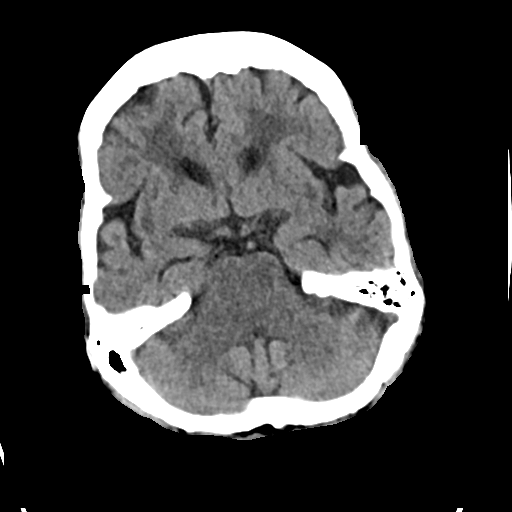
[im 13/30  brain]
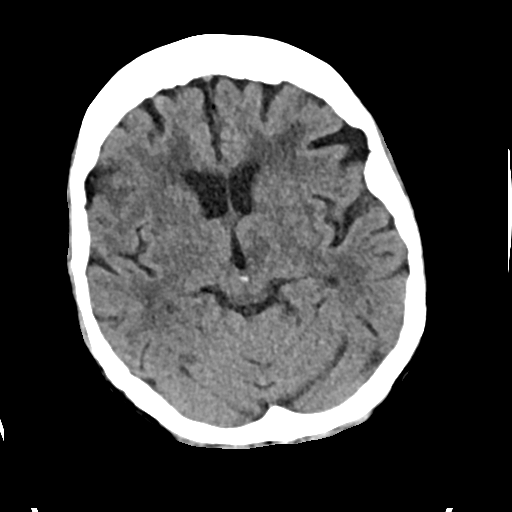
[im 17/30  brain]
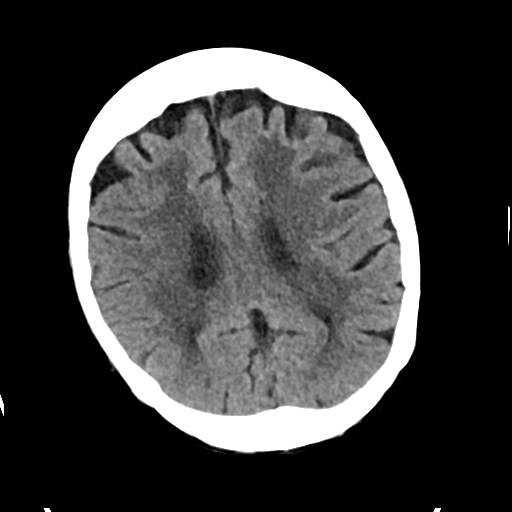
[im 21/30  brain]
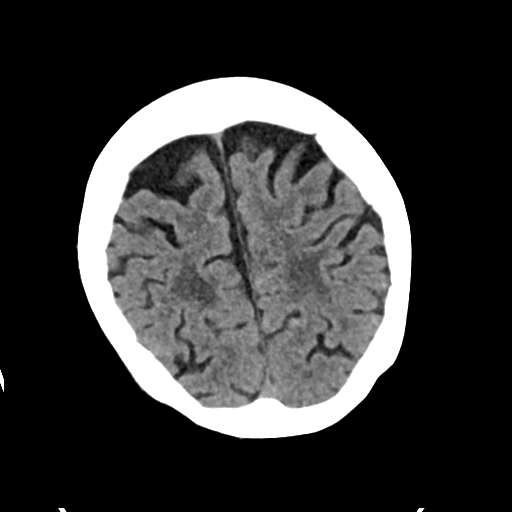
[im 21/30  bone]
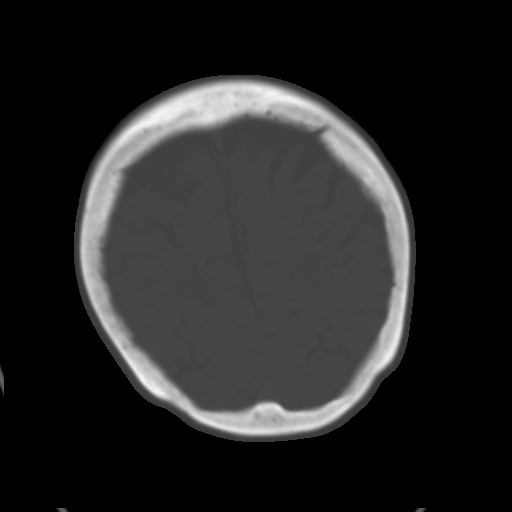
[im 25/30  brain]
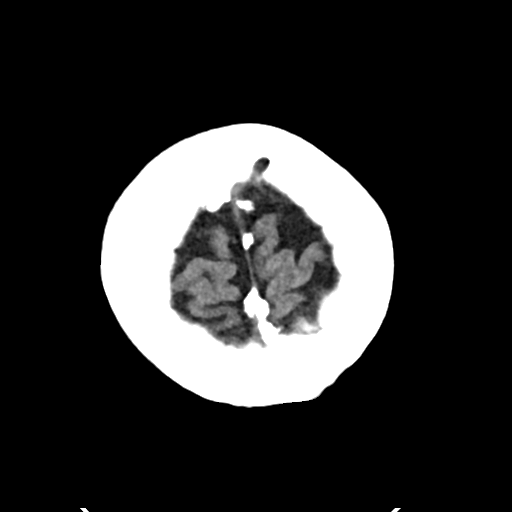

[Series 5: head bone · axial · 0.41mm/px · z∈[-96,-60]mm · 3 of 77 slices shown]
[im 8/77  bone]
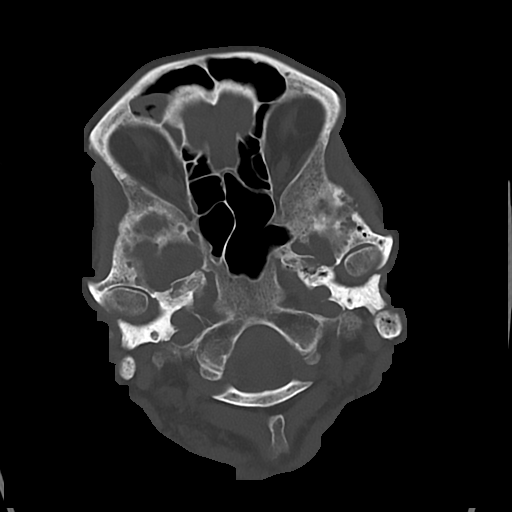
[im 15/77  bone]
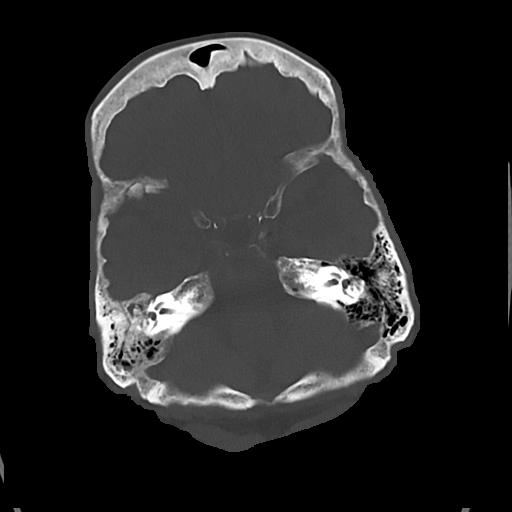
[im 26/77  bone]
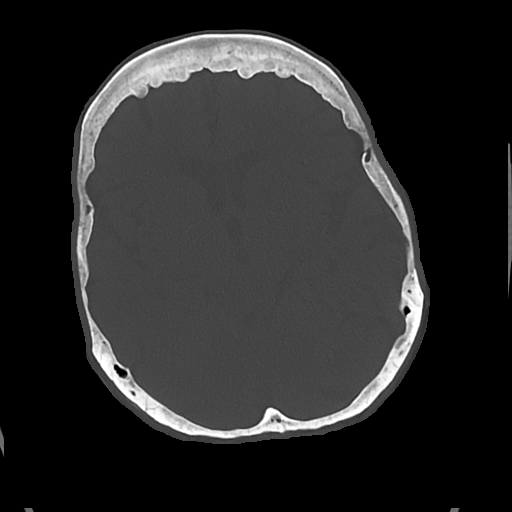

[Series 6: cor soft · coronal · 0.31mm/px · 3 of 65 slices shown]
[im 22/65  brain]
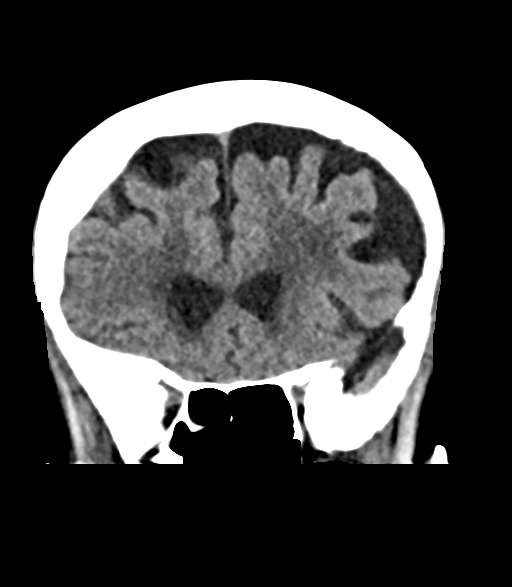
[im 29/65  brain]
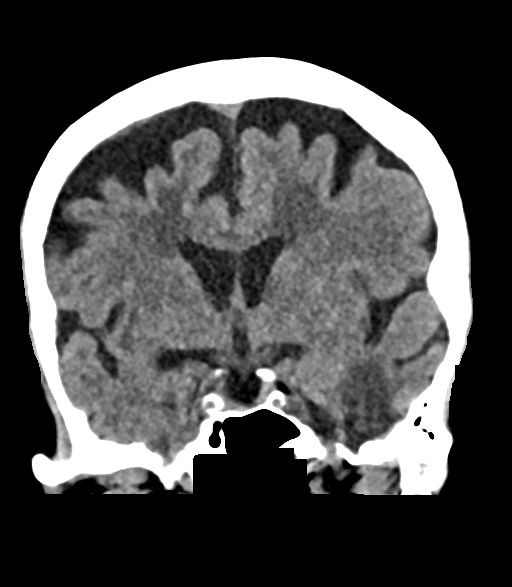
[im 36/65  brain]
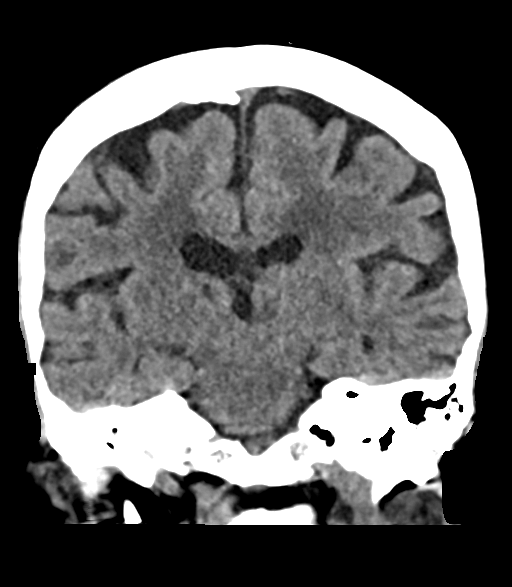

[Series 7: sag soft · sagittal · 0.29mm/px · 3 of 51 slices shown]
[im 17/51  brain]
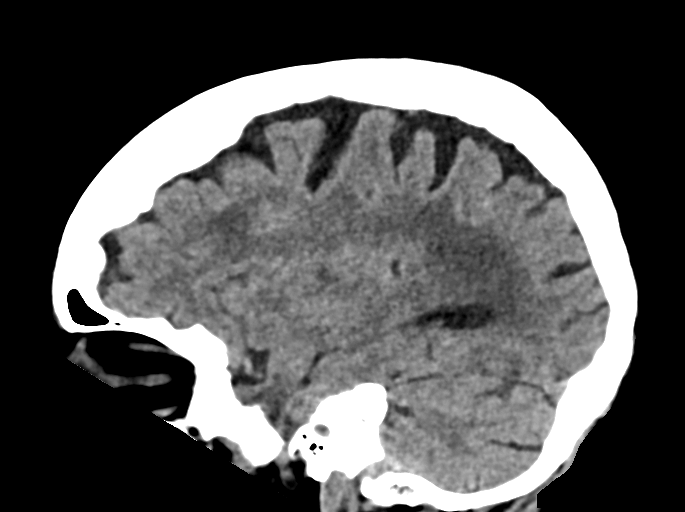
[im 26/51  brain]
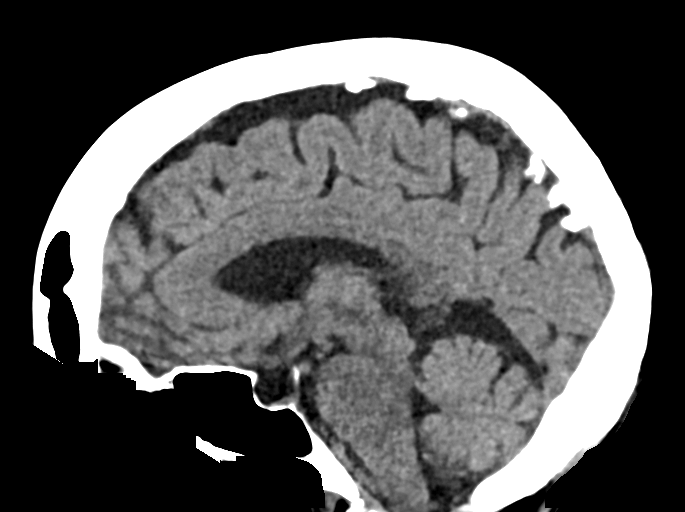
[im 34/51  brain]
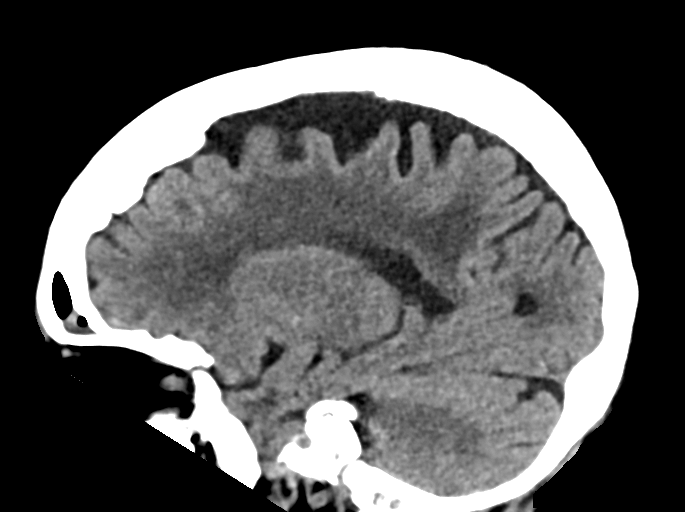

[15 of 47 positions shown; findings below may reference images not displayed]

FINDINGS: Brain: Generalized cerebral volume loss since [C2]. No
ventriculomegaly. No midline shift, mass effect, or evidence of
intracranial mass lesion.

Chronic appearing lacunar infarct right thalamus. Larger more
indistinct hypodense area in the left medial thalamus. Confluent
bilateral cerebral white matter hypodensity.

Encephalomalacia in the anterior inferior left temporal lobe. And
small areas of chronic appearing cortical encephalomalacia
posterosuperior left frontal gyrus.

Vague asymmetric hypodensity also in the left pons.

No acute intracranial hemorrhage identified. No acute cortically
based infarct identified.

Vascular: Calcified atherosclerosis at the skull base.

Skull: No acute osseous abnormality identified.

Sinuses/Orbits: Mild right paranasal sinus mucosal thickening. New
opacified right tympanic cavity and right mastoid air cells.

Other: Visualized orbits and scalp soft tissues are within normal
limits.

ASPECTS (Alberta Stroke Program Early CT Score)

Total score (0-10 with 10 being normal): 10
IMPRESSION: 1. Advanced chronic ischemic disease, including age indeterminate
small vessel disease in the left thalamus and possibly also pons. A
right thalamic lacune has a more chronic appearance.
2. No acute cortically based infarct or acute intracranial
hemorrhage identified. ASPECTS 10.
3. These results were communicated to [REDACTED] at [DATE] on
[DATE] by text page via the [employer] messaging system.

## 2021-07-23 MED ORDER — SODIUM CHLORIDE 0.9% FLUSH
3.0000 mL | Freq: Two times a day (BID) | INTRAVENOUS | Status: DC
  Administered 2021-07-25 – 2021-07-26 (×3): 3 mL via INTRAVENOUS

## 2021-07-23 MED ORDER — SODIUM BICARBONATE 650 MG PO TABS
650.0000 mg | ORAL_TABLET | Freq: Two times a day (BID) | ORAL | Status: DC
  Administered 2021-07-23 – 2021-07-26 (×6): 650 mg via ORAL
  Filled 2021-07-23 (×6): qty 1

## 2021-07-23 MED ORDER — FLUTICASONE FUROATE-VILANTEROL 200-25 MCG/ACT IN AEPB
1.0000 | INHALATION_SPRAY | Freq: Every day | RESPIRATORY_TRACT | Status: DC
  Administered 2021-07-23 – 2021-07-26 (×4): 1 via RESPIRATORY_TRACT
  Filled 2021-07-23 (×2): qty 28

## 2021-07-23 MED ORDER — ASPIRIN EC 81 MG PO TBEC
81.0000 mg | DELAYED_RELEASE_TABLET | Freq: Every day | ORAL | Status: DC
  Administered 2021-07-23 – 2021-07-24 (×2): 81 mg via ORAL
  Filled 2021-07-23 (×2): qty 1

## 2021-07-23 MED ORDER — PREDNISONE 5 MG PO TABS
5.0000 mg | ORAL_TABLET | Freq: Every day | ORAL | Status: DC
  Administered 2021-07-24 – 2021-07-26 (×3): 5 mg via ORAL
  Filled 2021-07-23 (×4): qty 1

## 2021-07-23 MED ORDER — DULOXETINE HCL 20 MG PO CPEP
20.0000 mg | ORAL_CAPSULE | Freq: Every day | ORAL | Status: DC
  Administered 2021-07-24 – 2021-07-26 (×3): 20 mg via ORAL
  Filled 2021-07-23 (×3): qty 1

## 2021-07-23 MED ORDER — SODIUM CHLORIDE 0.9% FLUSH: 3.0000 mL | INTRAVENOUS | Status: DC | PRN

## 2021-07-23 MED ORDER — MYCOPHENOLATE SODIUM 180 MG PO TBEC
180.0000 mg | DELAYED_RELEASE_TABLET | Freq: Two times a day (BID) | ORAL | Status: DC
  Administered 2021-07-23 – 2021-07-26 (×6): 180 mg via ORAL
  Filled 2021-07-23 (×7): qty 1

## 2021-07-23 MED ORDER — SUCRALFATE 1 G PO TABS
1.0000 g | ORAL_TABLET | Freq: Four times a day (QID) | ORAL | Status: DC
  Administered 2021-07-23 – 2021-07-26 (×10): 1 g via ORAL
  Filled 2021-07-23 (×10): qty 1

## 2021-07-23 MED ORDER — PANTOPRAZOLE SODIUM 40 MG PO TBEC
40.0000 mg | DELAYED_RELEASE_TABLET | Freq: Two times a day (BID) | ORAL | Status: DC
  Administered 2021-07-23 – 2021-07-26 (×6): 40 mg via ORAL
  Filled 2021-07-23 (×6): qty 1

## 2021-07-23 MED ORDER — ACETAMINOPHEN 650 MG RE SUPP: 650.0000 mg | Freq: Four times a day (QID) | RECTAL | Status: DC | PRN

## 2021-07-23 MED ORDER — SODIUM CHLORIDE 0.9 % IV SOLN: 250.0000 mL | INTRAVENOUS | Status: DC | PRN

## 2021-07-23 MED ORDER — CARVEDILOL 6.25 MG PO TABS
6.2500 mg | ORAL_TABLET | Freq: Two times a day (BID) | ORAL | Status: DC
  Administered 2021-07-23 – 2021-07-26 (×6): 6.25 mg via ORAL
  Filled 2021-07-23: qty 1
  Filled 2021-07-23 (×3): qty 2
  Filled 2021-07-23 (×2): qty 1

## 2021-07-23 MED ORDER — ACETAMINOPHEN 325 MG PO TABS: 650.0000 mg | ORAL_TABLET | Freq: Four times a day (QID) | ORAL | Status: DC | PRN

## 2021-07-23 MED ORDER — SODIUM CHLORIDE 0.9% FLUSH: 3.0000 mL | Freq: Once | INTRAVENOUS | Status: DC

## 2021-07-23 MED ORDER — TACROLIMUS 1 MG PO CAPS
1.0000 mg | ORAL_CAPSULE | Freq: Two times a day (BID) | ORAL | Status: DC
  Administered 2021-07-23 – 2021-07-26 (×6): 1 mg via ORAL
  Filled 2021-07-23 (×6): qty 1

## 2021-07-23 MED ORDER — ALBUTEROL SULFATE (2.5 MG/3ML) 0.083% IN NEBU: 2.5000 mg | INHALATION_SOLUTION | Freq: Four times a day (QID) | RESPIRATORY_TRACT | Status: DC | PRN

## 2021-07-23 NOTE — Assessment & Plan Note (Addendum)
Recent hospitalization with negative capsule and EGD with duodenal ulcer and esophageal ulcer and erosive esophagitis  Flex sigmoid: small mouth diverticula, hemorrhoids and blood in colon  hgb 5.8 at that time, received 4 units PRBC (2 units 12/24 and 2 units 12/26)  hgb has been stable  Continue to monitor

## 2021-07-23 NOTE — Assessment & Plan Note (Addendum)
Allow for permissive HTN in setting of possible CVA Coreg tomorrow  IV parameters written

## 2021-07-23 NOTE — Assessment & Plan Note (Signed)
DVT doppler negative

## 2021-07-23 NOTE — Assessment & Plan Note (Signed)
Euvolemic on exam Echo: 4/21: EF of 55-60%, normal LVF. Diastolic indeterminate.  Strict I/O Continue coreg tomorrow

## 2021-07-23 NOTE — Assessment & Plan Note (Addendum)
Chronic right sided weakness, worse than baseline today  -on no statin -start back ASA  -PT/OT eval

## 2021-07-23 NOTE — Assessment & Plan Note (Addendum)
States she does not use a cpap and declines here

## 2021-07-23 NOTE — ED Triage Notes (Signed)
Pt arrives via EMS as code stroke- LSN 1845 yesterday. Pt alert on arrival. Complaints of right sided arm, leg weakness, slurred speech, right sided facial droop. HR 70, BP 156/90

## 2021-07-23 NOTE — Assessment & Plan Note (Signed)
Continue her cymbalta 20mg 

## 2021-07-23 NOTE — Assessment & Plan Note (Signed)
Renal function stable at baseline: 3.0-3.2 Continue prograf, prednisone and myfortic

## 2021-07-23 NOTE — Assessment & Plan Note (Signed)
Renal function stable at baseline: 3.0-3.2 Continue to monitor I/O

## 2021-07-23 NOTE — Assessment & Plan Note (Signed)
Continue telemetry AC on hold with recurrent GI bleeds

## 2021-07-23 NOTE — Assessment & Plan Note (Signed)
76 year old with HTN, HLD, ESRD presenting with acute on chronic RLE weakness, slurred speech, vision changes and increased confusion concerning for acute CVA -place in observation on telemetry for TIA/stroke work-up -Neurochecks per protocol -Neurology consulted -MRI brain without contrast-has PPM will have to be tomorrow  -echo -a1c/lipid panel  -ASA 81mg  only. curbsided GI, Dr. Henrene Pastor, who agrees with starting back ASA with recent diverticular bleed in setting of possible CVA -Permissive hypertension first 24 hours <220/110 -N.p.o. until bedside swallow screen -PT/ OT/ SLP consult

## 2021-07-23 NOTE — Assessment & Plan Note (Deleted)
Reasonably well controlled, will allow for permissive HTN with possible stroke Start coreg tomorrow IV parameters for systolic >825

## 2021-07-23 NOTE — H&P (Signed)
History and Physical    Jane REAVES GLO:[id number] DOB: [birth year] DOA: 07/23/2021  PCP: [name], NP Consultants:  nephrology: Dr. Royce Macadamia, pulm: Dr. Elsworth Soho, Gi: Dr. Collene Mares, oncology: Dr. Irene Limbo  Patient coming from:  Home - lives with her husband   Chief Complaint: right leg weakness.   HPI: WENDELL [name] is a 76 y.o. female with medical history significant of  ESRD s/p renal transplant in 2017 now with CKD-5, ICH, A. Fib s/p ablation and LAA closure, AVB s/p PPM, DVT/PE s/p IVC filter, duodenal ulcer, diastolic CHF, asthma, OSA and depression who presented with acute onset of right leg weakness. She was walking around the house around 7:30pm and she became dizzy and felt like she was going to fall because of sudden right leg weakness. She couldn't walk and her husband helped her lie down and went to bed. She didn't want to come to the hospital. This morning she woke up and couldn't move her right leg and they called guardian life alert. She also had some slurred speech this morning and last night per husband. He also states her right eye became swollen, but this has improved. She denies any upper arm weakness. She had some confusion during these episodes as well. I talked to her husband later who states she has had increased confusion over the past 1-2 weeks and is overall quite weak.   No fever/chills, she had some vision changes out of her right eye when it was swollen, this has resolved. No chest pain or palpitations, shortness of breath or cough, stomach pain, N/V/D, dysuria.   She also has swelling in her right leg. This started on Thursday. She has had some pain as well.   Recently admitted to [facility] on 12/23 for AABLA due to rectal bleed and AKI on CKD. Received 4 units of PRBC. EGD showed revealed esophageal ulcer, duodenal ulcer,  grade D LA erosive esophagitis and nonbleeding AVM treated with APC.  Flexible sigmoidoscopy showed blood in rectum, rectosigmoid colon and sigmoid colon with  internal and external hemorrhoids.  Video capsule endoscopy started on 07/11/2021-normal.   She does not smoke, no family hx of strokes, she does not drink.   ED Course: vitals: temp: 97.2, bp: 161/86, HR: 70, RR: 22, oxygen: 100% RA Pertinent labs: hgb: 9.5 (about 10), BUN: 32, creatinine: 3.17 (3.0-3.2),  CTH: age indeterminate small vessel disease in the left thalamus and possibly also pons. A right thalamic lacune has a more chronic appearance.  In ED: neurology consulted. Code stroke initiated. TRH asked to admit.   Review of Systems: As per HPI; otherwise review of systems reviewed and negative.   Ambulatory Status:  Ambulates with walker, sometimes a cane     Past Medical History:  Diagnosis Date   Acute diastolic (congestive) heart failure (HCC) 08/12/2017   Acute GI bleeding 11/14/2019   Acute kidney injury superimposed on chronic kidney disease ([place]) 11/17/2017   AKI (acute kidney injury) ([place]) 11/04/2017   Anemia    Anxiety    ARF (acute renal failure) ([place]) 06/06/2011   Arthritis    "shoulders; arms; hips" (02/04/2018)   Asthma    Atrial fibrillation (HCC)    Atrial flutter ([place])    s/p aflutter ablation at [facility]   Bacteriuria, asymptomatic 11/14/2020   Benign hypertension with ESRD (end-stage renal disease) ([place])    Blood transfusion    never had a reaction to blood transfusion   Cardiomyopathy Mar 2013   Mild, EF 50-55% by  Mar 2013 ECHO, diast dysfxn II   Cardiomyopathy [facility])    CHF (congestive heart failure) ([place]) 2005   CHF (congestive heart failure) ([place])    Childhood asthma    Closed fracture of right distal radius 10/18/2016   CMV (cytomegalovirus infection) ([place]) 10/03/2015   Constipation    takes Miralax daily   Constipation    Depression    takes Zoloft daily   Distal radius fracture, right    DVT of lower extremity, bilateral ([place]) 12/21/11   "they're there now; been there for 2 wks"   Eczema    End stage renal disease ([place]) 11/06/2011   ESRD (end stage  renal disease) on dialysis [facility] 09/2011   07-19-2015 had Kidney transplant at [facility]; "don't get dialysis anymore" (02/04/2018)   Essential hypertension 07/07/2007   Qualifier: Diagnosis of  By: Garen Grams     Fractures, stress    in both feet--6 OR 7 YRS AGO--RESOLVED   Generalized edema [id number]   GI bleed 11/14/2019   Gout    doesn't require meds    HCAP (healthcare-associated pneumonia) 09/10/2011   Hearing difficulty    Hematoma of left lower leg 05/17/2020   High cholesterol    History of CVA (cerebrovascular accident) without residual deficits    History of hip replacement, total, right    History of pneumonia    Hx of Clostridium difficile infection    Hypercalcemia    09/10/11   Hyperparathyroidism ([place]) 04/07/2013   Hypertension    takes Diltiazem daily    Hypomagnesemia 08/02/2015   Hypophosphatemia 11/08/2017   ICB (intracranial bleed) ([place])    Immunosuppression ([place]) 07/25/2015   Memory changes    Morbid obesity ([place])    Nonischemic dilated cardiomyopathy ([place])    Obesity 04/07/2013   Oligouria    Oral mucositis 02/22/2018   OSA on CPAP    PAF (paroxysmal atrial fibrillation) (HCC)     HX OF CEREBRAL BLEED WHILE ON COUMADIN-SO PT NOT ON ANY BLOOD THINNERS NOW   Peripheral vascular disease ([place])    Persistent atrial fibrillation ([place]) 08/12/2017   Overview:  Added automatically from request for surgery [id number]  Formatting of this note might be different from the original. Added automatically from request for surgery [id number]   Presence of Watchman left atrial appendage closure device 10/04/2017   Proteinuria 09/04/2016   Psoriasis 08/07/2016   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/25/2015   S/P insertion of IVC (inferior vena caval) filter    Sepsis ([place]) 11/08/2017   Stress fracture    bilateral feet   Stroke ([place]) 2009   denies residual on 02/04/2018;  hemorrhagic now off coumadin   Stroke ([place]) 07/17/2007   denies residual, hemorrhagic now off coumadin   Stroke due  to intracerebral hemorrhage ([place]) 2009   Suture reaction 09/04/2016   Transfusion history    Use of cane as ambulatory aid     Past Surgical History:  Procedure Laterality Date   AV FISTULA PLACEMENT  09/28/2011   Procedure: ARTERIOVENOUS (AV) FISTULA CREATION;  Surgeon: Rosetta Posner, MD;  Location: [place];  Service: Vascular;  Laterality: Left;   BIOPSY  07/10/2021   Procedure: BIOPSY;  Surgeon: Rush Landmark Telford Nab., MD;  Location[facility];  Service: Gastroenterology;;   CARDIAC CATHETERIZATION     CARDIAC VALVE SURGERY  1960    FOR ATRIAL SEPTAL DEFECT   CHOLECYSTECTOMY     1980's   COLONOSCOPY     COLONOSCOPY WITH PROPOFOL N/A 11/16/2019  Procedure: COLONOSCOPY WITH PROPOFOL;  Surgeon: Juanita Craver, MD;  Location: [facility] ENDOSCOPY;  Service: Endoscopy;  Laterality: N/A;   CYSTOSCOPY     many yrs ago   ESOPHAGOGASTRODUODENOSCOPY N/A 07/10/2021   Procedure: ESOPHAGOGASTRODUODENOSCOPY (EGD);  Surgeon: Irving Copas., MD;  Location: [facility];  Service: Gastroenterology;  Laterality: N/A;   ESOPHAGOGASTRODUODENOSCOPY (EGD) WITH PROPOFOL N/A 11/16/2019   Procedure: ESOPHAGOGASTRODUODENOSCOPY (EGD) WITH PROPOFOL;  Surgeon: Juanita Craver, MD;  Location: [facility] ENDOSCOPY;  Service: Endoscopy;  Laterality: N/A;   ESOPHAGOGASTRODUODENOSCOPY (EGD) WITH PROPOFOL N/A 03/20/2021   Procedure: ESOPHAGOGASTRODUODENOSCOPY (EGD) WITH PROPOFOL;  Surgeon: Milus Banister, MD;  Location: [facility] ENDOSCOPY;  Service: Endoscopy;  Laterality: N/A;   FEMUR FRACTURE SURGERY Right 03/2011; 09/03/2011   "had 2, 2 wk apart in 2012; broke it again 08/2011 & had OR"   [place] N/A 07/10/2021   Procedure: FLEXIBLE SIGMOIDOSCOPY;  Surgeon: Rush Landmark Telford Nab., MD;  Location: [facility];  Service: Gastroenterology;  Laterality: N/A;   FRACTURE SURGERY     GIVENS CAPSULE STUDY N/A 07/11/2021   Procedure: GIVENS CAPSULE STUDY;  Surgeon: Juanita Craver, MD;  Location: [facility];  Service: Endoscopy;   Laterality: N/A;   HEMOSTASIS CONTROL  03/20/2021   Procedure: HEMOSTASIS CONTROL;  Surgeon: Milus Banister, MD;  Location: [place];  Service: Endoscopy;;   HOT HEMOSTASIS N/A 03/20/2021   Procedure: HOT HEMOSTASIS (ARGON PLASMA COAGULATION/BICAP);  Surgeon: Milus Banister, MD;  Location: [facility] ENDOSCOPY;  Service: Endoscopy;  Laterality: N/A;   HOT HEMOSTASIS N/A 07/10/2021   Procedure: HOT HEMOSTASIS (ARGON PLASMA COAGULATION/BICAP);  Surgeon: Irving Copas., MD;  Location: [facility];  Service: Gastroenterology;  Laterality: N/A;   I & D EXTREMITY  09/15/2011   Procedure: IRRIGATION AND DEBRIDEMENT EXTREMITY w REMOVAL OF HARDWARE;  Surgeon: [name], MD;  Location: [place];  Service: Orthopedics;  Laterality: Right;   INCISION AND DRAINAGE HIP  12/21/2011   Procedure: IRRIGATION AND DEBRIDEMENT HIP;  Surgeon: [name], MD;  Location: [place];  Service: Orthopedics;  Laterality: Right;  I&D RIGHT HIP WITH PLACEMENT ANTIBIOTIC SPACER   INSERTION OF DIALYSIS CATHETER     Procedure: INSERTION OF DIALYSIS CATHETER;  Surgeon: Rosetta Posner, MD;  Location: [place];  Service: Vascular;  Laterality: Right;   IRRIGATION AND DEBRIDEMENT ABSCESS  12/21/11   right hip   JOINT REPLACEMENT     KIDNEY TRANSPLANT  07/19/15   LAPAROSCOPIC GASTRIC BANDING  2008   LEFT ATRIAL APPENDAGE OCCLUSION  10/04/2017   OPEN REDUCTION INTERNAL FIXATION (ORIF) DISTAL RADIAL FRACTURE Right 10/18/2016   Procedure: OPEN REDUCTION INTERNAL FIXATION (ORIF) RIGHT DISTAL RADIAL FRACTURE;  Surgeon: Marchia Bond, MD;  Location: [place];  Service: Orthopedics;  Laterality: Right;   SUBMUCOSAL TATTOO INJECTION  07/10/2021   Procedure: SUBMUCOSAL TATTOO INJECTION;  Surgeon: Irving Copas., MD;  Location: [facility];  Service: Gastroenterology;;   TOTAL HIP ARTHROPLASTY Right 02/2011   right THA 02/2011, I&D/removal of hardware 09/2011,, repeat I&D Jun 2013, reimplantation R THA 03-26-2012   TOTAL  HIP REVISION  03/25/2012   Procedure: TOTAL HIP REVISION;  Surgeon: [name], MD;  Location: [facility];  Service: Orthopedics;  Laterality: Right;  Right Total Hip Reimplantation   TUBAL LIGATION     VENA CAVA FILTER PLACEMENT  11/2011    Social History   Socioeconomic History   Marital status: Married    Spouse name: Not on file   Number of children: Not on file  Years of education: Not on file   Highest education level: Not on file  Occupational History   Occupation: retired  Tobacco Use   Smoking status: Never   Smokeless tobacco: Never  Vaping Use   Vaping Use: Never used  Substance and Sexual Activity   Alcohol use: Never   Drug use: Never   Sexual activity: Yes    Birth control/protection: Post-menopausal  Other Topics Concern   Not on file  Social History Narrative   Married; retired; lives in [place] Strain: Not on file  Food Insecurity: Not on file  Transportation Needs: Not on file  Physical Activity: Not on file  Stress: Not on file  Social Connections: Not on file  Intimate Partner Violence: Not on file    Allergies  Allergen Reactions   Warfarin Sodium Other (See Comments)    Caused her to have a stroke/left side of brain to hemorrhage    Ace Inhibitors Cough   Amiodarone Other (See Comments)    Pt with Restrictive Lung Disease by 10/2017 PFT with decreased DLCO   Amlodipine Swelling    Swelling in feet    Family History  Problem Relation Age of Onset   Cancer Father    Diabetes Mother    Hypertension Mother    Arthritis Mother    Hodgkin's lymphoma Other 65       dscd---HODGKINS DISEASE   Hypertension Brother     Prior to Admission medications   Medication Sig Start Date End Date Taking? Authorizing Provider  acetaminophen (TYLENOL) 500 MG tablet Take 500 mg by mouth every 6 (six) hours as needed for mild pain or headache.     [provider]  albuterol (PROVENTIL) (2.5  MG/3ML) 0.083% nebulizer solution Take 3 mLs (2.5 mg total) by nebulization every 6 (six) hours as needed for wheezing or shortness of breath. Dx:J45.909 06/21/21   [name], [name], NP  albuterol (VENTOLIN HFA) 108 (90 Base) MCG/ACT inhaler Inhale 1-2 puffs into the lungs every 6 (six) hours as needed for wheezing or shortness of breath. Patient not taking: Reported on 07/08/2021 06/21/21   [name], [name], NP  budesonide-formoterol [facility]) 160-4.5 MCG/ACT inhaler Inhale 2 puffs into the lungs 2 (two) times daily. 06/13/21   [name], MD  carvedilol (COREG) 6.25 MG tablet Take 6.25 mg by mouth 2 (two) times daily. 07/11/21   [provider]  docusate sodium (COLACE) 100 MG capsule Take 100 mg by mouth at bedtime as needed for mild constipation.    [provider]  DULoxetine 40 MG CPEP Take 40 mg by mouth daily. TAKE 1 CAPSULE BY MOUTH EVERY DAY 07/21/21   [name], [name], NP  mycophenolate (MYFORTIC) 180 MG EC tablet Take 180 mg by mouth 2 (two) times daily.    [provider]  ondansetron (ZOFRAN) 4 MG tablet Take 1 tablet (4 mg total) by mouth every 8 (eight) hours as needed for up to 9 doses for nausea. 03/25/21   [name], MD  pantoprazole (PROTONIX) 40 MG tablet Take 1 tablet (40 mg total) by mouth 2 (two) times daily for 30 days, THEN 1 tablet (40 mg total) daily. 07/13/21 10/11/21  [name], MD  predniSONE (DELTASONE) 5 MG tablet Take 5 mg by mouth daily. 02/21/21   [provider]  sodium bicarbonate 650 MG tablet Take 1 tablet (650 mg total) by mouth 2 (two) times daily. 07/13/21  08/12/21  [name], MD  sucralfate (CARAFATE) 1 g tablet Take 1 tablet (1 g total) by mouth 4 (four) times daily. 07/13/21 08/12/21  [name], MD  tacrolimus (PROGRAF) 1 MG capsule Take 1 mg by mouth 2 (two) times daily.    [provider]    Physical Exam: Vitals:   07/23/21 1500 07/23/21 1745 07/23/21 1800 07/23/21 1900  BP:  (!) 141/82 (!) 154/86 (!) 159/94 (!) 142/80  Pulse: 69 85  77  Resp: (!) 26 (!) 28  (!) 25  Temp:    98.8 F (37.1 C)  TempSrc:    Oral  SpO2: 100%   100%     General:  Appears calm and comfortable and is in NAD Eyes:  PERRL, EOMI, normal lids, iris. Right periorbital area with trace edema.  ENT:  grossly normal hearing, lips & tongue, mmm; appropriate dentition Neck:  no LAD, masses or thyromegaly; no carotid bruits Cardiovascular:  RRR, no m/r/g. RLE edema, non pitting.  Respiratory:   CTA bilaterally with no wheezes/rales/rhonchi.  Normal respiratory effort. Abdomen:  soft, NT, ND, NABS Back:   normal alignment, no CVAT Skin:  no rash or induration seen on limited exam Musculoskeletal:  BUE 4/5, BLE: 2-3/5, right maybe only slightly more noticeably weak. good ROM, no bony abnormality Lower extremity:   Limited foot exam with no ulcerations.  2+ distal pulses. Psychiatric:  grossly normal mood and affect, speech fluent and appropriate, AOx3 Neurologic:  right eye CN 3/4/6 superior gaze defect with some lateral nystagmus.  CN V intact, CN VII: very mild right sided mouth droop, CN VIII-XII intact. moves all extremities in coordinated fashion, sensation intact. HTK can not perform due to weakness. FTN: intact, but noticeable ataxic movement on right. Gait deferred.     Radiological Exams on Admission: Independently reviewed - see discussion in A/P where applicable  CT HEAD CODE STROKE WO CONTRAST  Result Date: 07/23/2021 CLINICAL DATA:  Code stroke. 76 year old female with left side weakness. EXAM: CT HEAD WITHOUT CONTRAST TECHNIQUE: Contiguous axial images were obtained from the base of the skull through the vertex without intravenous contrast. COMPARISON:  Head CT without contrast 09/12/2011. FINDINGS: Brain: Generalized cerebral volume loss since 2013. No ventriculomegaly. No midline shift, mass effect, or evidence of intracranial mass lesion. Chronic appearing lacunar infarct right  thalamus. Larger more indistinct hypodense area in the left medial thalamus. Confluent bilateral cerebral white matter hypodensity. Encephalomalacia in the anterior inferior left temporal lobe. And small areas of chronic appearing cortical encephalomalacia posterosuperior left frontal gyrus. Vague asymmetric hypodensity also in the left pons. No acute intracranial hemorrhage identified. No acute cortically based infarct identified. Vascular: Calcified atherosclerosis at the skull base. Skull: No acute osseous abnormality identified. Sinuses/Orbits: Mild right paranasal sinus mucosal thickening. New opacified right tympanic cavity and right mastoid air cells. Other: Visualized orbits and scalp soft tissues are within normal limits. ASPECTS [facility] Stroke Program Early CT Score) Total score (0-10 with 10 being normal): 10 IMPRESSION: 1. Advanced chronic ischemic disease, including age indeterminate small vessel disease in the left thalamus and possibly also pons. A right thalamic lacune has a more chronic appearance. 2. No acute cortically based infarct or acute intracranial hemorrhage identified. ASPECTS 10. 3. These results were communicated to Dr. Quinn Axe at 10:55 am on 07/23/2021 by text page via the [facility] messaging system. Electronically Signed   By: Genevie Ann M.D.   On: 07/23/2021 11:00   VAS [name] LOWER EXTREMITY VENOUS (DVT) (7a-7p)  Result Date: 07/23/2021  Lower Venous DVT Study Patient Name:  [name]  Date of Exam:   07/23/2021 Medical Rec #: [id number]      Accession #:    [id number] Date of Birth: August 17, 1945     Patient Gender: F Patient Age:   47 years Exam [facility]  [facility] Procedure:      VAS [name] LOWER EXTREMITY VENOUS (DVT) Referring Phys: MATTHEW TRIFAN --------------------------------------------------------------------------------  Indications: Right leg swelling.  Limitations: Size and depth of vessels. Comparison Study: 12-21-2017 Bilateral lower extremity venous was negative for                    acute DVT. 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 Bilateral testing is considered an integral part of a complete examination. Limited examinations for reoccurring indications may be performed as noted. The reflux portion of the exam is performed with the patient in reverse Trendelenburg.  +---------+---------------+---------+-----------+----------+-------------------+  RIGHT     Compressibility Phasicity Spontaneity Properties Thrombus Aging       +---------+---------------+---------+-----------+----------+-------------------+  CFV       Full            Yes       Yes                                         +---------+---------------+---------+-----------+----------+-------------------+  SFJ       Full                                                                  +---------+---------------+---------+-----------+----------+-------------------+  FV Prox   Full                                                                  +---------+---------------+---------+-----------+----------+-------------------+  FV Mid    Full                                                                  +---------+---------------+---------+-----------+----------+-------------------+  FV Distal Full                                                                  +---------+---------------+---------+-----------+----------+-------------------+  PFV  Full                                                                  +---------+---------------+---------+-----------+----------+-------------------+  POP       Full            Yes       Yes                                          +---------+---------------+---------+-----------+----------+-------------------+  PTV       Full                                                                  +---------+---------------+---------+-----------+----------+-------------------+  PERO      Full                                             Not well visualized  +---------+---------------+---------+-----------+----------+-------------------+  Gastroc   Full                                                                  +---------+---------------+---------+-----------+----------+-------------------+   +----+---------------+---------+-----------+----------+--------------+  LEFT Compressibility Phasicity Spontaneity Properties Thrombus Aging  +----+---------------+---------+-----------+----------+--------------+  CFV  Full            Yes       Yes                                    +----+---------------+---------+-----------+----------+--------------+    Summary: RIGHT: - There is no evidence of deep vein thrombosis in the lower extremity.  - No cystic structure found in the popliteal fossa.  LEFT: - No evidence of common femoral vein obstruction.  *See table(s) above for measurements and observations.    Preliminary     EKG: Independently reviewed.  NSR-paced with rate 70; nonspecific ST changes with no evidence of acute ischemia   Labs on Admission: I have personally reviewed the available labs and imaging studies at the time of the admission.  Pertinent labs:  hgb: 9.5 (10),  BUN: 32,  creatinine: 3.09 (3.0-3.2),   Assessment/Plan * Right leg weakness- (present on admission) 76 year old with HTN, HLD, ESRD presenting with acute on chronic RLE weakness, slurred speech, vision changes and increased confusion concerning for acute CVA -place in observation on telemetry for TIA/stroke work-up -Neurochecks per protocol -Neurology consulted -MRI brain without contrast-has PPM will have to be tomorrow  -echo -a1c/lipid panel  -ASA 81mg  only.  curbsided GI, Dr. Henrene Pastor, who agrees with starting back ASA with recent diverticular bleed in  setting of possible CVA -Permissive hypertension first 24 hours <220/110 -N.p.o. until bedside swallow screen -PT/ OT/ SLP consult   Erosive esophagitis/esophageal ulceration/duodenal ulceration- (present on admission) Recent EGD/capsule study 07/10/21 -Protonix 40 mg twice daily for 1 month followed by 40 mg daily -Continue Carafate ACHS -f/u with GI outpatient   Acute blood loss anemia secondary to diverticular bleed - (present on admission) Recent hospitalization with negative capsule and EGD with duodenal ulcer and esophageal ulcer and erosive esophagitis  Flex sigmoid: small mouth diverticula, hemorrhoids and blood in colon  hgb 5.8 at that time, received 4 units PRBC (2 units 12/24 and 2 units 12/26)  hgb has been stable  Continue to monitor   CKD (chronic kidney disease), stage V ([place])- (present on admission) Renal function stable at baseline: 3.0-3.2 Continue to monitor I/O   Hypertension- (present on admission) Allow for permissive HTN in setting of possible CVA Coreg tomorrow  IV parameters written   History of kidney transplant 2017 Renal function stable at baseline: 3.0-3.2 Continue prograf, prednisone and myfortic   Atrial fibrillation s/p AVN ablation/PPM implant, s/p LAAC with Watchman- (present on admission) Continue telemetry AC on hold with recurrent GI bleeds   Asthmatic bronchitis without complication- (present on admission) Stable, continue symbicort/albuterol prn   History of CVA with residual deficit Chronic right sided weakness, worse than baseline today  -on no statin -start back ASA  -PT/OT eval    Chronic combined systolic and diastolic CHF (congestive heart failure) ([place])- (present on admission) Euvolemic on exam Echo: 4/21: EF of 55-60%, normal LVF. Diastolic indeterminate.  Strict I/O Continue coreg tomorrow   Depression- (present on  admission) Continue her cymbalta 20mg    Obstructive sleep apnea- (present on admission) States she does not use a cpap and declines here   Edema of right lower leg- (present on admission) DVT doppler negative       There is no height or weight on file to calculate BMI.   Level of care: Telemetry Medical DVT prophylaxis: SCDs Code Status:  Full - confirmed with patient/family Family Communication: husband at bedside: Harrold Donath  Disposition Plan:  The patient is from: home  Anticipated d/c is to: home without [facility] services once her cardiology issues have been resolved.  Patient placed in observation as anticipate less than 2 midnight stay. Requires hospitalization for stroke work up, constant monitoring and assessment and is at risk of neurologically worsening.    Patient is currently: stable.  Consults called: neurology: Dr. Quinn Axe   Admission status:  observation    Orma Flaming MD [facility]   How to contact the [facility] Attending or Consulting provider [place] or covering provider during after hours [place], for this patient?  Check the care team in [facility] and look for a) attending/consulting TR[facility] provider listed and b) the [place] team listed Log into [email or web address] and use [place]'s universal password to access. If you do not have the password, please contact the hospital operator. Locate the [facility] provider you are looking for under [facility] and page to a number that you can be directly reached. If you still have difficulty reaching the provider, please page the [facility] (Director on Call) for the Hospitalists listed on amion for assistance.   07/23/2021, 8:48 PM

## 2021-07-23 NOTE — Evaluation (Addendum)
Speech Language Pathology Evaluation Patient Details Name: BASIA MCGINTY MRN: [id number] DOB: [birth year] Today's Date: 07/23/2021 Time: 6546-5035 SLP Time Calculation (min) (ACUTE ONLY): 28 min  Problem List:  Patient Active Problem List   Diagnosis Date Noted   Right leg weakness 07/23/2021   Erosive esophagitis/esophageal ulceration/duodenal ulceration 07/23/2021   Acute GI bleeding 07/08/2021   Azotemia 07/08/2021   Pressure injury of skin 07/08/2021   Asthma, chronic, unspecified asthma severity, with acute exacerbation 06/15/2021   Acute renal failure ([place]) 06/15/2021   Chronic combined systolic and diastolic CHF (congestive heart failure) ([place]) 06/15/2021   Acute respiratory failure with hypoxia ([place]) 06/14/2021   Duodenal ulcer with hemorrhage 03/29/2021   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   Gastroesophageal reflux disease 06/29/2020   Iron deficiency anemia 06/29/2020   SOB (shortness of breath) 03/11/2020   Pneumonia due to COVID-19 virus 03/11/2020   Traumatic seroma of lower leg, right, subsequent encounter 01/28/2020   Other secondary pulmonary hypertension ([place]) 12/03/2019   Medication management 11/04/2019   Acute on chronic combined systolic and diastolic congestive heart failure ([place]) 10/22/2019   Cardiac pacemaker in situ 04/18/2018   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   Personal history of PE (pulmonary embolism) 46/56/8127   Chronic systolic heart failure ([place]) 02/11/2018   Hyperkalemia 02/03/2018   Lower extremity edema 12/10/2017   Presence of Watchman left atrial appendage closure device 10/04/2017   Chronic respiratory failure with hypoxia ([place]) 10/03/2017   Dyspnea 07/26/2017   Atrial flutter ([place])    Peripheral vascular disease ([place])    Oligouria    Hypertension    Fractures, stress    Eczema    Depression    Constipation    Arthritis    Exacerbation of asthma 04/01/2017   Closed fracture of right distal radius 10/18/2016    Proteinuria 09/04/2016   History of kidney transplant 2017 07/25/2015   Immunosuppression ([place]) 07/25/2015   Nonischemic dilated cardiomyopathy (HCC)    S/P insertion of IVC (inferior vena caval) filter 04/07/2013   Use of cane as ambulatory aid 04/07/2013   S/P hip replacement 03/25/2012   DVT of lower extremity, bilateral ([place]) 12/21/2011   Personal history of DVT/PE s/p IVC filter 12/08/2011   Obstructive sleep apnea 12/08/2011   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[place]) 10/24/2011   Pseudomonas aeruginosa infection 10/24/2011   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 ([place]) 09/14/2011   Hypercalcemia    Gout 09/10/2011   Acute blood loss anemia 09/04/2011   Absence of right hip joint with antibiotic pacer 09/03/2011   Traumatic seroma of thigh ([place]) 06/11/2011   Clostridium difficile colitis 06/09/2011   Leukocytosis 06/08/2011   Symptomatic anemia 06/06/2011   SINUSITIS- ACUTE-NOS 09/27/2010   Atrial fibrillation s/p AVN ablation/PPM implant, s/p LAAC with Watchman 11/25/2008   CVA 09/28/2008   History of CVA with residual deficit 09/28/2008   Hemiplegia ([place]) 09/21/2008   Psoriasis 10/20/2007   Asthmatic bronchitis without complication 51/70/0174   Past Medical History:  Past Medical History:  Diagnosis Date   Acute diastolic (congestive) heart failure ([place]) 08/12/2017   Acute GI bleeding 11/14/2019   Acute kidney injury superimposed on chronic kidney disease ([place]) 11/17/2017   AKI (acute kidney injury) ([place]) 11/04/2017   Anemia    Anxiety    ARF (acute renal failure) ([place]) 06/06/2011   Arthritis    "shoulders; arms; hips" (02/04/2018)   Asthma    Atrial fibrillation ([place])    Atrial flutter ([place])  s/p aflutter ablation at [facility]   Bacteriuria, asymptomatic 11/14/2020   Benign hypertension with ESRD (end-stage renal disease) ([place])    Blood transfusion    never had a reaction to blood transfusion   Cardiomyopathy Mar 2013   Mild, EF 50-55% by Mar 2013  ECHO, diast dysfxn II   Cardiomyopathy ([place])    CHF (congestive heart failure) ([place]) 2005   CHF (congestive heart failure) ([place])    Childhood asthma    Closed fracture of right distal radius 10/18/2016   CMV (cytomegalovirus infection) ([place]) 10/03/2015   Constipation    takes Miralax daily   Constipation    Depression    takes Zoloft daily   Distal radius fracture, right    DVT of lower extremity, bilateral ([place]) 12/21/11   "they're there now; been there for 2 wks"   Eczema    End stage renal disease ([place]) 11/06/2011   ESRD (end stage renal disease) on dialysis [facility] 09/2011   07-19-2015 had Kidney transplant at [facility]; "don't get dialysis anymore" (02/04/2018)   Essential hypertension 07/07/2007   Qualifier: Diagnosis of  By: Garen Grams     Fractures, stress    in both feet--6 OR 7 YRS AGO--RESOLVED   Generalized edema [id number]   GI bleed 11/14/2019   Gout    doesn't require meds    HCAP (healthcare-associated pneumonia) 09/10/2011   Hearing difficulty    Hematoma of left lower leg 05/17/2020   High cholesterol    History of CVA (cerebrovascular accident) without residual deficits    History of hip replacement, total, right    History of pneumonia    Hx of Clostridium difficile infection    Hypercalcemia    09/10/11   Hyperparathyroidism ([place]) 04/07/2013   Hypertension    takes Diltiazem daily    Hypomagnesemia 08/02/2015   Hypophosphatemia 11/08/2017   ICB (intracranial bleed) ([place])    Immunosuppression ([place]) 07/25/2015   Memory changes    Morbid obesity ([place])    Nonischemic dilated cardiomyopathy ([place])    Obesity 04/07/2013   Oligouria    Oral mucositis 02/22/2018   OSA on CPAP    PAF (paroxysmal atrial fibrillation) (HCC)     HX OF CEREBRAL BLEED WHILE ON COUMADIN-SO PT NOT ON ANY BLOOD THINNERS NOW   Peripheral vascular disease ([place])    Persistent atrial fibrillation ([place]) 08/12/2017   Overview:  Added automatically from request for surgery [id number]  Formatting of  this note might be different from the original. Added automatically from request for surgery [id number]   Presence of Watchman left atrial appendage closure device 10/04/2017   Proteinuria 09/04/2016   Psoriasis 08/07/2016   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/25/2015   S/P insertion of IVC (inferior vena caval) filter    Sepsis ([place]) 11/08/2017   Stress fracture    bilateral feet   Stroke ([place]) 2009   denies residual on 02/04/2018;  hemorrhagic now off coumadin   Stroke ([place]) 07/17/2007   denies residual, hemorrhagic now off coumadin   Stroke due to intracerebral hemorrhage ([place]) 2009   Suture reaction 09/04/2016   Transfusion history    Use of cane as ambulatory aid    Past Surgical History:  Past Surgical History:  Procedure Laterality Date   AV FISTULA PLACEMENT  09/28/2011   Procedure: ARTERIOVENOUS (AV) FISTULA CREATION;  Surgeon: Rosetta Posner, MD;  Location: [place];  Service: Vascular;  Laterality: Left;   BIOPSY  07/10/2021   Procedure: BIOPSY;  Surgeon: Irving Copas., MD;  Location: [facility] ENDOSCOPY;  Service: Gastroenterology;;   CARDIAC CATHETERIZATION     CARDIAC VALVE SURGERY  1960    FOR ATRIAL SEPTAL DEFECT   CHOLECYSTECTOMY     1980's   COLONOSCOPY     COLONOSCOPY WITH PROPOFOL N/A 11/16/2019   Procedure: COLONOSCOPY WITH PROPOFOL;  Surgeon: Juanita Craver, MD;  Location[facility] ENDOSCOPY;  Service: Endoscopy;  Laterality: N/A;   CYSTOSCOPY     many yrs ago   ESOPHAGOGASTRODUODENOSCOPY N/A 07/10/2021   Procedure: ESOPHAGOGASTRODUODENOSCOPY (EGD);  Surgeon: Irving Copas., MD;  Location: [facility];  Service: Gastroenterology;  Laterality: N/A;   ESOPHAGOGASTRODUODENOSCOPY (EGD) WITH PROPOFOL N/A 11/16/2019   Procedure: ESOPHAGOGASTRODUODENOSCOPY (EGD) WITH PROPOFOL;  Surgeon: Juanita Craver, MD;  Location[facility] ENDOSCOPY;  Service: Endoscopy;  Laterality: N/A;   ESOPHAGOGASTRODUODENOSCOPY (EGD) WITH PROPOFOL N/A 03/20/2021   Procedure: ESOPHAGOGASTRODUODENOSCOPY (EGD) WITH  PROPOFOL;  Surgeon: Milus Banister, MD;  Location: [facility] ENDOSCOPY;  Service: Endoscopy;  Laterality: N/A;   FEMUR FRACTURE SURGERY Right 03/2011; 09/03/2011   "had 2, 2 wk apart in 2012; broke it again 08/2011 & had OR"   [place] N/A 07/10/2021   Procedure: FLEXIBLE SIGMOIDOSCOPY;  Surgeon: Rush Landmark Telford Nab., MD;  Location: [facility];  Service: Gastroenterology;  Laterality: N/A;   FRACTURE SURGERY     GIVENS CAPSULE STUDY N/A 07/11/2021   Procedure: GIVENS CAPSULE STUDY;  Surgeon: Juanita Craver, MD;  [facility] ENDOS[facility];  Service: Endoscopy;  Laterality: N/A;   HEMOSTASIS CONTROL  03/20/2021   Procedure: HEMOSTASIS CONTROL;  Surgeon: Milus Banister, MD;  Location: [place];  Service: Endoscopy;;   HOT HEMOSTASIS N/A 03/20/2021   Procedure: HOT HEMOSTASIS (ARGON PLASMA COAGULATION/BICAP);  Surgeon: Milus Banister, MD;  Location: [facility] ENDOSCOPY;  Service: Endoscopy;  Laterality: N/A;   HOT HEMOSTASIS N/A 07/10/2021   Procedure: HOT HEMOSTASIS (ARGON PLASMA COAGULATION/BICAP);  Surgeon: Irving Copas., MD;  Location: [facility];  Service: Gastroenterology;  Laterality: N/A;   I & D EXTREMITY  09/15/2011   Procedure: IRRIGATION AND DEBRIDEMENT EXTREMITY w REMOVAL OF HARDWARE;  Surgeon: [name], MD;  Location: [place];  Service: Orthopedics;  Laterality: Right;   INCISION AND DRAINAGE HIP  12/21/2011   Procedure: IRRIGATION AND DEBRIDEMENT HIP;  Surgeon: [name], MD;  Location: [place];  Service: Orthopedics;  Laterality: Right;  I&D RIGHT HIP WITH PLACEMENT ANTIBIOTIC SPACER   INSERTION OF DIALYSIS CATHETER     Procedure: INSERTION OF DIALYSIS CATHETER;  Surgeon: Rosetta Posner, MD;  Location: [place];  Service: Vascular;  Laterality: Right;   IRRIGATION AND DEBRIDEMENT ABSCESS  12/21/11   right hip   JOINT REPLACEMENT     KIDNEY TRANSPLANT  07/19/15   LAPAROSCOPIC GASTRIC BANDING  2008   LEFT ATRIAL APPENDAGE OCCLUSION  10/04/2017   OPEN REDUCTION INTERNAL  FIXATION (ORIF) DISTAL RADIAL FRACTURE Right 10/18/2016   Procedure: OPEN REDUCTION INTERNAL FIXATION (ORIF) RIGHT DISTAL RADIAL FRACTURE;  Surgeon: Marchia Bond, MD;  Location: [place];  Service: Orthopedics;  Laterality: Right;   SUBMUCOSAL TATTOO INJECTION  07/10/2021   Procedure: SUBMUCOSAL TATTOO INJECTION;  Surgeon: Irving Copas., MD;  Location: [facility];  Service: Gastroenterology;;   TOTAL HIP ARTHROPLASTY Right 02/2011   right THA 02/2011, I&D/removal of hardware 09/2011,, repeat I&D Jun 2013, reimplantation R THA 03-26-2012   TOTAL HIP REVISION  03/25/2012   Procedure: TOTAL HIP REVISION;  Surgeon: [name], MD;  Location: [facility];  Service: Orthopedics;  Laterality: Right;  Right Total Hip Reimplantation   TUBAL LIGATION     VENA CAVA FILTER PLACEMENT  11/2011   HPI:  Pt is a 76 y.o. female with who presented with acute onset of right leg weakness. CT head negative for acute changes. MRI pending at time of evaluation. PMH: ESRD s/p renal transplant in 2017 now with CKD-5, ICH, A. Fib s/p ablation and LAA closure, AVB s/p PPM, DVT/PE s/p IVC filter, duodenal ulcer, diastolic CHF, asthma, OSA and depression.   Assessment / Plan / Recommendation Clinical Impression  Pt was seen for speech/language evaluation with her husband present. Both parties agreed that the pt is having acute communication difficulty which the pt's husband described as "forgetting words" and "getting confused" when asked to do things. Pt presents with mild to moderate aphasia characterized by impairments in receptive and expressive language. Pt's speech was moderately fluent with frequent word retrieval difficulty during conversation, and inconsistent awareness of paraphasic errors. Pt exhibited paraphasic errors and perseveration during structured naming tasks, but she was intermittently able to self-correct. She was able to follow all 1-step commands, and most 2-step commands, to  accurately respond to simple yes/no questions, and to demonstrate reading comprehension at the word level; however, pt exhibited increasing difficulty beyond these levels. No motor speech deficits were noted during the evaluation and formal cognition assessment was deferred due to pt's language impairment, but some impairments in attention were noted. Skilled SLP services are clinically indicated at this time.    SLP Assessment  SLP Recommendation/Assessment: Patient needs continued Speech [place] Pathology Services SLP Visit Diagnosis: Aphasia (R47.01)    Recommendations for follow up therapy are one component of a multi-disciplinary discharge planning process, led by the attending physician.  Recommendations may be updated based on patient status, additional functional criteria and insurance authorization.    Follow Up Recommendations   (Continued SLP services at level of care recommended by PT/OT)    Assistance Recommended at Discharge  Intermittent Supervision/Assistance  Functional Status Assessment Patient has had a recent decline in their functional status and demonstrates the ability to make significant improvements in function in a reasonable and predictable amount of time.  Frequency and Duration min 2x/week  2 weeks      SLP Evaluation Cognition  Overall Cognitive Status: Difficult to assess Arousal/Alertness: Awake/alert Orientation Level: Oriented X4 Year:  (1967) Month: January Day of Week: Incorrect Attention: Focused;Sustained Focused Attention: Impaired Focused Attention Impairment: Verbal complex       Comprehension  Auditory Comprehension Overall Auditory Comprehension: Impaired Yes/No Questions: Impaired Basic Immediate Environment Questions:  (4/5) Complex Questions:  (3/5) Paragraph Comprehension (via yes/no questions):  (2/4) Commands: Impaired Two Step Basic Commands:  (4/4) Multistep Basic Commands:  (1/3) Reading Comprehension Reading Status:  Impaired Word level: Within functional limits Sentence Level: Impaired    Expression Expression Primary Mode of Expression: Verbal Verbal Expression Overall Verbal Expression: Impaired Initiation: No impairment Automatic Speech: Counting;Day of week;Month of year (Counting: 10/10; days: 7/7 with cues for initiation; months: 7/12; subsequent months were sporatic and out of sequence) Level of Generative/Spontaneous Verbalization: Conversation Naming: Impairment Responsive:  (3/5) Confrontation: Impaired (4/6) Convergent:  (Sentence completion: 5/5) Verbal Errors: Perseveration;Phonemic paraphasias Pragmatics: No impairment   Oral / Pharmacist, community Speech Overall Motor Speech: Appears within functional limits for tasks assessed Respiration: Within functional limits Resonance: Within functional limits Articulation: Impaired Level of Impairment: Conversation Motor Planning: [place]nin Education officer, environmental Errors: Aware;Consistent  Raye Wiens I. Hardin Negus, [place], [place] Office number [phone] Pager [place] 07/23/2021, 4:41 PM

## 2021-07-23 NOTE — Assessment & Plan Note (Signed)
Stable, continue symbicort/albuterol prn

## 2021-07-23 NOTE — ED Provider Notes (Signed)
[facility] EMERGENCY DEPARTMENT Provider Note   CSN: [id number] Arrival date & time: 07/23/21  1040  An emergency department physician performed an initial assessment on this suspected stroke patient at 1045.  History  CC: Right leg weakness   [name] is a 76 y.o. female presenting with right-sided weakness of the lower leg.  Last known [name] reported last night by her husband.  Patient has a history of end-stage renal disease, was formally on dialysis now status post renal transplant.  She denies headache.  She reports that she is not able to lift up her right leg, though she was able to use yesterday.  HPI     Home Medications Prior to Admission medications   Medication Sig Start Date End Date Taking? Authorizing Provider  acetaminophen (TYLENOL) 500 MG tablet Take 500 mg by mouth every 6 (six) hours as needed for mild pain or headache.    Yes [provider]  albuterol (PROVENTIL) (2.5 MG/3ML) 0.083% nebulizer solution Take 3 mLs (2.5 mg total) by nebulization every 6 (six) hours as needed for wheezing or shortness of breath. Dx:J45.909 06/21/21  Yes [name], [name], NP  albuterol (VENTOLIN HFA) 108 (90 Base) MCG/ACT inhaler Inhale 1-2 puffs into the lungs every 6 (six) hours as needed for wheezing or shortness of breath. 06/21/21  Yes [name], [name], NP  budesonide-formoterol (SYMBICORT) 160-4.5 MCG/ACT inhaler Inhale 2 puffs into the lungs 2 (two) times daily. 06/13/21  Yes [name], MD  carvedilol (COREG) 6.25 MG tablet Take 6.25 mg by mouth 2 (two) times daily. 07/11/21  Yes [provider]  docusate sodium (COLACE) 100 MG capsule Take 100 mg by mouth at bedtime as needed for mild constipation.   Yes [provider]  DULoxetine 40 MG CPEP Take 40 mg by mouth daily. TAKE 1 CAPSULE BY MOUTH EVERY DAY Patient taking differently: Take 20 mg by mouth daily. TAKE 1 CAPSULE BY MOUTH EVERY DAY 07/21/21  Yes [name], [name], NP  mycophenolate (MYFORTIC) 180 MG EC tablet Take 180 mg by mouth 2 (two) times daily.   Yes [provider]  ondansetron (ZOFRAN) 4 MG tablet Take 1 tablet (4 mg total) by mouth every 8 (eight) hours as needed for up to 9 doses for nausea. 03/25/21  Yes [name], MD  pantoprazole (PROTONIX) 40 MG tablet Take 1 tablet (40 mg total) by mouth 2 (two) times daily for 30 days, THEN 1 tablet (40 mg total) daily. 07/13/21 10/11/21 Yes [name], MD  predniSONE (DELTASONE) 5 MG tablet Take 5 mg by mouth daily. 02/21/21  Yes [provider]  sodium bicarbonate 650 MG tablet Take 1 tablet (650 mg total) by mouth 2 (two) times daily. 07/13/21 08/12/21 Yes [name], MD  sucralfate (CARAFATE) 1 g tablet Take 1 tablet (1 g total) by mouth 4 (four) times daily. 07/13/21 08/12/21 Yes [name], MD  tacrolimus (PROGRAF) 1 MG capsule Take 1 mg by mouth 2 (two) times daily.   Yes [provider]      Allergies    Warfarin sodium, Ace inhibitors, Amiodarone, and Amlodipine    Review of Systems   Review of Systems  Physical Exam Updated Vital Signs BP (!) 141/82    Pulse 69    Temp (!) 97.2 F (36.2 C) (Oral)    Resp (!) 26    SpO2 100%  Physical Exam Constitutional:      General: She  is not in acute distress. HENT:     Head: Normocephalic and atraumatic.  Eyes:     Conjunctiva/sclera: Conjunctivae normal.     Pupils: Pupils are equal, round, and reactive to light.  Cardiovascular:     Rate and Rhythm: Normal rate and regular rhythm.  Pulmonary:     Effort: Pulmonary effort is normal. No respiratory distress.  Skin:    General: Skin is warm and dry.  Neurological:     General: No focal deficit present.     Mental Status: She is alert. Mental status is at baseline.     Comments: Right leg edema and weakness    ED Results / Procedures / Treatments   Labs (all labs ordered are listed, but only abnormal results are displayed) Labs Reviewed   CBC - Abnormal; Notable for the following components:      Result Value   RBC 3.58 (*)    Hemoglobin 9.5 (*)    HCT 32.0 (*)    MCHC 29.7 (*)    RDW 20.8 (*)    All other components within normal limits  COMPREHENSIVE METABOLIC PANEL - Abnormal; Notable for the following components:   CO2 20 (*)    BUN 32 (*)    Creatinine, Ser 3.17 (*)    Calcium 8.5 (*)    Total Protein 4.9 (*)    Albumin 2.5 (*)    GFR, Estimated 15 (*)    All other components within normal limits  HEMOGLOBIN A1C - Abnormal; Notable for the following components:   Hgb A1c MFr Bld 4.7 (*)    All other components within normal limits  I-STAT CHEM 8, ED - Abnormal; Notable for the following components:   BUN 42 (*)    Creatinine, Ser 3.10 (*)    Calcium, Ion 1.13 (*)    TCO2 21 (*)    Hemoglobin 11.2 (*)    HCT 33.0 (*)    All other components within normal limits  RESP PANEL BY RT-PCR (FLU A&B, COVID) ARPGX2  DIFFERENTIAL  URINALYSIS, ROUTINE W REFLEX MICROSCOPIC  TSH  PROTIME-INR  PROTIME-INR  APTT  CBG MONITORING, ED    EKG EKG Interpretation  Date/Time:  Sunday July 23 2021 11:50:13 EST Ventricular Rate:  70 PR Interval:    QRS Duration: 168 QT Interval:  437 QTC Calculation: 472 R Axis:   270 Text Interpretation: Paced rhythm Nonspecific IVCD with LAD Confirmed by Octaviano Glow [phone]) on 07/23/2021 11:58:08 AM  Radiology CT HEAD CODE STROKE WO CONTRAST  Result Date: 07/23/2021 CLINICAL DATA:  Code stroke. 76 year old female with left side weakness. EXAM: CT HEAD WITHOUT CONTRAST TECHNIQUE: Contiguous axial images were obtained from the base of the skull through the vertex without intravenous contrast. COMPARISON:  Head CT without contrast 09/12/2011. FINDINGS: Brain: Generalized cerebral volume loss since 2013. No ventriculomegaly. No midline shift, mass effect, or evidence of intracranial mass lesion. Chronic appearing lacunar infarct right thalamus. Larger more indistinct hypodense  area in the left medial thalamus. Confluent bilateral cerebral white matter hypodensity. Encephalomalacia in the anterior inferior left temporal lobe. And small areas of chronic appearing cortical encephalomalacia posterosuperior left frontal gyrus. Vague asymmetric hypodensity also in the left pons. No acute intracranial hemorrhage identified. No acute cortically based infarct identified. Vascular: Calcified atherosclerosis at the skull base. Skull: No acute osseous abnormality identified. Sinuses/Orbits: Mild right paranasal sinus mucosal thickening. New opacified right tympanic cavity and right mastoid air cells. Other: Visualized orbits and scalp soft tissues are within normal  limits. ASPECTS [facility] Stroke Program Early CT Score) Total score (0-10 with 10 being normal): 10 IMPRESSION: 1. Advanced chronic ischemic disease, including age indeterminate small vessel disease in the left thalamus and possibly also pons. A right thalamic lacune has a more chronic appearance. 2. No acute cortically based infarct or acute intracranial hemorrhage identified. ASPECTS 10. 3. These results were communicated to Dr. Quinn Axe at 10:55 am on 07/23/2021 by text page via the [facility] messaging system. Electronically Signed   By: Genevie Ann M.D.   On: 07/23/2021 11:00   VAS [name] LOWER EXTREMITY VENOUS (DVT) (7a-7p)  Result Date: 07/23/2021  Lower Venous DVT Study Patient Name:  [name]  Date of Exam:   07/23/2021 Medical Rec #: [id number]      Accession #:    [id number] Date of Birth: 1945-09-14     Patient Gender: F Patient Age:   45 years Exam Location:  [facility] Procedure:      VAS [name] LOWER EXTREMITY VENOUS (DVT) Referring Phys: Christle Nolting --------------------------------------------------------------------------------  Indications: Right leg swelling.  Limitations: Size and depth of vessels. Comparison Study: 12-21-2017 Bilateral lower extremity venous was negative for                   acute DVT. 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 Bilateral testing is considered an integral part of a complete examination. Limited examinations for reoccurring indications may be performed as noted. The reflux portion of the exam is performed with the patient in reverse Trendelenburg.  +---------+---------------+---------+-----------+----------+-------------------+  RIGHT     Compressibility Phasicity Spontaneity Properties Thrombus Aging       +---------+---------------+---------+-----------+----------+-------------------+  CFV       Full            Yes       Yes                                         +---------+---------------+---------+-----------+----------+-------------------+  SFJ       Full                                                                  +---------+---------------+---------+-----------+----------+-------------------+  FV Prox   Full                                                                  +---------+---------------+---------+-----------+----------+-------------------+  FV Mid    Full                                                                  +---------+---------------+---------+-----------+----------+-------------------+  FV Distal Full                                                                  +---------+---------------+---------+-----------+----------+-------------------+  PFV       Full                                                                  +---------+---------------+---------+-----------+----------+-------------------+  POP       Full            Yes       Yes                                         +---------+---------------+---------+-----------+----------+-------------------+  PTV       Full                                                                   +---------+---------------+---------+-----------+----------+-------------------+  PERO      Full                                             Not [name] visualized  +---------+---------------+---------+-----------+----------+-------------------+  Gastroc   Full                                                                  +---------+---------------+---------+-----------+----------+-------------------+   +----+---------------+---------+-----------+----------+--------------+  LEFT Compressibility Phasicity Spontaneity Properties Thrombus Aging  +----+---------------+---------+-----------+----------+--------------+  CFV  Full            Yes       Yes                                    +----+---------------+---------+-----------+----------+--------------+    Summary: RIGHT: - There is no evidence of deep vein thrombosis in the lower extremity.  - No cystic structure found in the popliteal fossa.  LEFT: - No evidence of common femoral vein obstruction.  *See table(s) above for measurements and observations.    Preliminary     Procedures Procedures    Medications Ordered in ED Medications  sodium chloride flush (NS) 0.9 % injection 3 mL (3 mLs Intravenous Not Given 07/23/21 1102)  aspirin EC tablet 81 mg (81 mg Oral Given 07/23/21 1346)  sodium chloride flush (NS) 0.9 % injection 3 mL (3 mLs Intravenous Not Given 07/23/21 1426)  sodium chloride flush (NS) 0.9 % injection 3 mL (has no administration in time range)  0.9 %  sodium chloride infusion (has no administration in time range)  acetaminophen (TYLENOL) tablet 650 mg (has no administration in time range)    Or  acetaminophen (TYLENOL) suppository 650 mg (has no administration in time range)  carvedilol (COREG) tablet 6.25 mg (has no administration in time range)  DULoxetine HCl CPEP 20 mg (has no administration in time range)  predniSONE (DELTASONE) tablet 5 mg (has no administration in time range)   pantoprazole (PROTONIX) EC tablet 40 mg (has no administration in time range)  sodium bicarbonate tablet 650 mg (has no administration in time range)  sucralfate (CARAFATE) tablet 1 g (has no administration in time range)  mycophenolate (MYFORTIC) EC tablet 180 mg (has no administration in time range)  tacrolimus (PROGRAF) capsule 1 mg (has no administration in time range)  albuterol (PROVENTIL) (2.5 MG/3ML) 0.083% nebulizer solution 2.5 mg (has no administration in time range)  fluticasone furoate-vilanterol (BREO ELLIPTA) 200-25 MCG/ACT 1 puff (has no administration in time range)    ED Course/ Medical Decision Making/ A&P Clinical Course as of 07/23/21 1523  Sun Jul 23, 2021  1156 Patient was negative for acute DVT on her vascular imaging per technician report. [MT]  8756 Renal function appears to be at baseline which is 3.2.  Her labs are otherwise at baseline.  Neurologist is recommending medical admission for continued stroke work-up including MRI.  She will be paged for admission [MT]  1157 Nursing reported the patient passed her stroke swallow screen, patient is hungry and asking for food, which can be provided [MT]    Clinical Course User Index [MT] Prestina Raigoza, Carola Rhine, MD                           Medical Decision Making  This patient presents to the ED with concern for right leg weakness, this involves an extensive number of treatment options, and is a complaint that carries with it a high risk of complications and morbidity.  The differential diagnosis includes stroke versus DVT versus other  Co-morbidities that complicate the patient evaluation: History of prior strokes, history of mild dementia; history of end-stage renal disease status post kidney transplant   Additional history obtained from paramedics on arrival  External records from outside source obtained and reviewed including prior stroke history and work-up in the hospital  I ordered and personally interpreted  labs.  The pertinent results include:  creatinine at baseline, CBC largely unremarkable.  COVID and flu negative  I ordered imaging studies including CT scan of the head, DVT ultrasound of the right lower extremity I independently visualized and interpreted imaging which showed no acute thrombosis of the lower extremity, no obvious acute large CVA. I agree with the radiologist interpretation  The patient was maintained on a cardiac monitor.  I personally viewed and interpreted the cardiac monitored which showed an underlying rhythm of: Paced rhythm  Per my interpretation the patient's ECG shows paced rhythm   Test Considered:  -Clinically this presentation is not consistent with an aortic dissection or pulmonary embolism   I requested consultation with the neurology and discussed lab and imaging findings as [name] as pertinent plan - they recommend: MRI scan which will need to be compatible with the patient's pacemaker, patient is not a candidate for thrombolytic therapy at this time.  After the interventions noted above, I reevaluated the patient  and found that they have: stayed the same  Social Determinants of Health:dementia  Dispostion:  After consideration of the diagnostic results and the patients response to treatment, I feel that the patent would benefit from hospitalization for further monitoring, MRI of the brain  Clinical Course as of 07/23/21 1523  Sun Jul 23, 2021  1156 Patient was negative for acute DVT on her vascular imaging per technician report. [MT]  5790 Renal function appears to be at baseline which is 3.2.  Her labs are otherwise at baseline.  Neurologist is recommending medical admission for continued stroke work-up including MRI.  She will be paged for admission [MT]  1157 Nursing reported the patient passed her stroke swallow screen, patient is hungry and asking for food, which can be provided [MT]    Clinical Course User Index [MT] Asah Lamay, Carola Rhine, MD            Final Clinical Impression(s) / ED Diagnoses Final diagnoses:  Right leg weakness    Rx / DC Orders ED Discharge Orders     None         Wyvonnia Dusky, MD 07/23/21 1523

## 2021-07-23 NOTE — Progress Notes (Signed)
Lower extremity venous RT study completed.  Preliminary results relayed to Langston Masker, MD via secure chat.  See CV Proc for preliminary results report.   Darlin Coco, RDMS, RVT

## 2021-07-23 NOTE — Progress Notes (Signed)
SLP Cancellation Note  Patient Details Name: [name] MRN: [id number] DOB: 1946/01/19   Cancelled treatment:       Reason Eval/Treat Not Completed: SLP screened, no needs identified, will sign off  Pt passed [facility] swallow screen; therefore, no formalized SLP swallow eval is needed per protocol. Will complete orders for swallow eval, but per RN's reports speech-language evaluation is clinically indicated.    [name] I. Hardin Negus, [place], [place] Office number [phone] Pager [phone]    Horton Marshall 07/23/2021, 2:26 PM

## 2021-07-23 NOTE — Assessment & Plan Note (Addendum)
Recent EGD/capsule study 07/10/21 -Protonix 40 mg twice daily for 1 month followed by 40 mg daily -Continue CarafateACHS -f/u with GI outpatient

## 2021-07-23 NOTE — Consult Note (Signed)
Neurology consult   CC: code stroke.  History is obtained from: EMS, chart, patient.   HPI:  Mr. [name] is a 76 yo female with a PMHx of CdHF, GIB, ESRD with kidney transplant 2017 at [facility] no longer on HD, anemia, OA, asthma, AF not on AC, HTN, depression, DVT bilateral 2013, remote CVA 2009 due to [place] without residual affects,  hyperparathyroidism, and non ischemic dilated cardiomyopathy. Patient presented as a code stroke per EMS. Per EMS, 911 was called due to right sided weakness over baseline in RLE. LKW was prior to bed last night, so TNK not offered.   After brief exam on the ED bridge and for airway clearance, patient was taken emergently to CT suite. CTH showed age indeterminate small vessel disease and remote stroke in thalamus. ASPECTS 10. No CTA head and neck was performed due to patient s/p kidney transplant. NO LVO suspected due to patient's limited focal neurological exam, no debilitating new weakness, no global aphasia, or gaze preference.   The LLE is swollen which patient says is chronic. It is slightly weaker than yesterday, but has been weaker since her ICH.    In review of chart, she had an [place] in 2009. She has AF but is not on AC due to hx of GIBs.   Neuro asked to consult due to code stroke.   LKW: bedtime on 07/22/21.  TNK given?: No,  OOW IR Thrombectomy?: No, no suspected LVO.  MRS: 1  NIHSS:  1a Level of Consciousness: 0 1b LOC Questions: 0 1c LOC Commands: 0 2 Best Gaze: 0 3 Visual: 0 4 Facial Palsy: 0 5a Motor Arm - left: 0 5b Motor Arm - Right: 1 6a Motor Leg - Left: 0 6b Motor Leg - Right: 2 7 Limb Ataxia: 0 8 Sensory: 0 9 Best Language: 1 10 Dysarthria: 0 11 Extinction and Inattention: 0 TOTAL:  4  ROS: A robust ROS was unable to be performed due to emergent nature of event.   Past Medical History:  Diagnosis Date   Acute diastolic (congestive) heart failure (HCC) 08/12/2017   Acute GI bleeding 11/14/2019   Acute kidney injury superimposed  on chronic kidney disease ([place]) 11/17/2017   AKI (acute kidney injury) ([place]) 11/04/2017   Anemia    Anxiety    ARF (acute renal failure) ([place]) 06/06/2011   Arthritis    "shoulders; arms; hips" (02/04/2018)   Asthma    Atrial fibrillation (HCC)    Atrial flutter ([place])    s/p aflutter ablation at [facility]   Bacteriuria, asymptomatic 11/14/2020   Benign hypertension with ESRD (end-stage renal disease) ([place])    Blood transfusion    never had a reaction to blood transfusion   Cardiomyopathy Mar 2013   Mild, EF 50-55% by Mar 2013 ECHO, diast dysfxn II   Cardiomyopathy ([place])    CHF (congestive heart failure) ([facility]) 2005   CHF (congestive heart failure) ([place])    Childhood asthma    Closed fracture of right distal radius 10/18/2016   CMV (cytomegalovirus infection) ([place]) 10/03/2015   Constipation    takes Miralax daily   Constipation    Depression    takes Zoloft daily   Distal radius fracture, right    DVT of lower extremity, bilateral ([place]) 12/21/11   "they're there now; been there for 2 wks"   Eczema    End stage renal disease ([place]) 11/06/2011   ESRD (end stage renal disease) on dialysis [facility]) 09/2011   07-19-2015 had Kidney  transplant at [facility]; "don't get dialysis anymore" (02/04/2018)   Essential hypertension 07/07/2007   Qualifier: Diagnosis of  By: Garen Grams     Fractures, stress    in both feet--6 OR 7 YRS AGO--RESOLVED   Generalized edema [id number]   GI bleed 11/14/2019   Gout    doesn't require meds    HCAP (healthcare-associated pneumonia) 09/10/2011   Hearing difficulty    Hematoma of left lower leg 05/17/2020   High cholesterol    History of CVA (cerebrovascular accident) without residual deficits    History of hip replacement, total, right    History of pneumonia    Hx of Clostridium difficile infection    Hypercalcemia    09/10/11   Hyperparathyroidism ([place]) 04/07/2013   Hypertension    takes Diltiazem daily    Hypomagnesemia 08/02/2015   Hypophosphatemia 11/08/2017    ICB (intracranial bleed) ([place])    Immunosuppression ([place]) 07/25/2015   Memory changes    Morbid obesity ([place])    Nonischemic dilated cardiomyopathy ([place])    Obesity 04/07/2013   Olig[place]ria    Oral mucositis 02/22/2018   OSA on CPAP    PAF (paroxysmal atrial fibrillation) (HCC)     HX OF CEREBRAL BLEED WHILE ON COUMADIN-SO PT NOT ON ANY BLOOD THINNERS NOW   Peripheral vascular disease ([place])    Persistent atrial fibrillation ([place]) 08/12/2017   Overview:  Added automatically from request for surgery [id number]  Formatting of this note might be different from the original. Added automatically from request for surgery [id number]   Presence of [place] left atrial appendage closure device 10/04/2017   Proteinuria 09/04/2016   Psoriasis 08/07/2016   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/25/2015   S/P insertion of IVC (inferior vena caval) filter    Sepsis ([place]) 11/08/2017   Stress fracture    bilateral feet   Stroke ([place]) 2009   denies residual on 02/04/2018;  hemorrhagic now off coumadin   Stroke ([place]) 07/17/2007   denies residual, hemorrhagic now off coumadin   Stroke due to intracerebral hemorrhage ([place]) 2009   Suture reaction 09/04/2016   Transfusion history    Use of cane as ambulatory aid     Family History  Problem Relation Age of Onset   Cancer Father    Diabetes Mother    Hypertension Mother    Arthritis Mother    Hodgkin's lymphoma Other 62       dscd---HODGKINS DISEASE   Hypertension Brother    Social History:  reports that she has never smoked. She has never used smokeless tobacco. She reports that she does not drink alcohol and does not use drugs.   Prior to Admission medications   Medication Sig Start Date End Date Taking? Authorizing Provider  acetaminophen (TYLENOL) 500 MG tablet Take 500 mg by mouth every 6 (six) hours as needed for mild pain or headache.     [provider]  albuterol (PROVENTIL) (2.5 MG/3ML) 0.083% nebulizer solution Take 3 mLs (2.5 mg total) by  nebulization every 6 (six) hours as needed for wheezing or shortness of breath. Dx:J45.909 06/21/21   [name], [name], NP  albuterol (VENTOLIN HFA) 108 (90 Base) MCG/ACT inhaler Inhale 1-2 puffs into the lungs every 6 (six) hours as needed for wheezing or shortness of breath. Patient not taking: Reported on 07/08/2021 06/21/21   [name], [name], NP  budesonide-formoterol [facility]) 160-4.5 MCG/ACT inhaler Inhale 2 puffs into the lungs 2 (two) times daily. 06/13/21   [name], MD  carvedilol (COREG)  6.25 MG tablet Take 6.25 mg by mouth 2 (two) times daily. 07/11/21   [provider]  docusate sodium (COLACE) 100 MG capsule Take 100 mg by mouth at bedtime as needed for mild constipation.    [provider]  DULoxetine 40 MG CPEP Take 40 mg by mouth daily. TAKE 1 CAPSULE BY MOUTH EVERY DAY 07/21/21   [name], [name], NP  mycophenolate (MYFORTIC) 180 MG EC tablet Take 180 mg by mouth 2 (two) times daily.    [provider]  ondansetron (ZOFRAN) 4 MG tablet Take 1 tablet (4 mg total) by mouth every 8 (eight) hours as needed for up to 9 doses for nausea. 03/25/21   [name], MD  pantoprazole (PROTONIX) 40 MG tablet Take 1 tablet (40 mg total) by mouth 2 (two) times daily for 30 days, THEN 1 tablet (40 mg total) daily. 07/13/21 10/11/21  [name], MD  predniSONE (DELTASONE) 5 MG tablet Take 5 mg by mouth daily. 02/21/21   [provider]  sodium bicarbonate 650 MG tablet Take 1 tablet (650 mg total) by mouth 2 (two) times daily. 07/13/21 08/12/21  [name], MD  sucralfate (CARAFATE) 1 g tablet Take 1 tablet (1 g total) by mouth 4 (four) times daily. 07/13/21 08/12/21  [name], MD  tacrolimus (PROGRAF) 1 MG capsule Take 1 mg by mouth 2 (two) times daily.    [provider]   Exam: Current vital signs: BP (!) 161/86 (BP Location: Right Arm)    Pulse 70    Temp (!) 97.2 F (36.2 C) (Oral)    Resp (!) 22    SpO2 100%    Physical Exam  Constitutional: Appears well-developed and well-nourished.  Psych: Affect appropriate to situation. Eyes: No scleral injection. HENT: No OP obstruction. Head: Normocephalic.  Cardiovascular: Normal rate and regular rhythm.  Respiratory: Effort normal.  GI: Abdomen soft.  No distension. There is no tenderness.  Skin: WDI. Ext: well perfused, no redness or heat to RLE.   Neuro: Mental Status: Patient is awake, alert, oriented to person, place, year, and situation. Thinks the month is December.  Patient is somewhat able to give a partial clear history.  No signs of neglect. Speech/Language:  Speech is clear, fluent without dysarthria, but with minor aphasia. Repetition and comprehension intact. She named some of the objects on the stroke test, but not hammock or cactus (called it a plant).   Cranial Nerves: II: Visual Fields are full. Pupils are equal, round, and reactive to light.  III,IV, VI: EOMI without ptosis or diploplia. Blinks to threat.   V: Facial sensation is symmetric to light touch in V1, V2, and V3. VII: Facial movement symmetrical.  VIII: hearing is intact to voice. X: Uvula elevates symmetrically. XI: Shoulder shrug is symmetric. XII: tongue is midline without atrophy or fasciculations.  Motor: RUE: grips  4/5     biceps  4/5     triceps  4/5 LUE: grips   5/5    biceps  5/5     triceps  5/5 RLE:on arrival, she could not lift her RLE, after CT, she was able to lift it off bed about 5 inches and hold. Plantar/dorsiflexion 4/5.  LLE: 5/5 Sensory: Sensation is symmetric to light touch in all fours extremities. Extinction absent to DSS.  Plantars: Toes are downgoing bilaterally.  Cerebellar: No ataxia noted with FNF. Unable to perforem HKS due to right LE weakness.    I have  reviewed labs in epic and the pertinent results are: INR 1.2.     aPTT  33.     creatinine 3.10 (ESRD s/p transplant).  MD reviewed the images obtained:  NCT head  Advanced  chronic ischemic disease, including age indeterminate small vessel disease in the left thalamus and possibly also pons. A right thalamic lacune has a more chronic appearance. 2. No acute cortically based infarct or acute intracranial hemorrhage identified. ASPECTS 10.  MRI brain ordered.   Vascular US RLE: chronic DVT but no new DVT.   Assessment: 76 yo female with stroke risk factors of prior ICH/stroke, HTN, HLD, Afib not on AC. CT head NAICP Unable to do imaging with contrast due to kidney transplant. Her initial exam had improved after [place] in ED room. She will be admitted for stroke w/up. Vascular RLE [name] negative for acute DVT.   Plan: - Medicine admit.  - MRI brain without contrast. - MRA H&N - Recommend TTE. - Recommend labs: HbA1c, lipid panel, TSH. - Recommend Statin or increased dose if LDL > 70 - SBP goal - Permissive hypertension first 24 h < 220/110. Hold home medications for now.  -No DAPT. Just ASA 81mg  po qd.  - Telemetry monitoring for arrhythmia. - bedside Swallow screen. - Stroke education. - PT/OT/SLP consult. - NIHSS as per protocol. - frequent neuro checks.  -stroke team to follow.   Patient seen by Clance Boll, MSN, APN-BC, nurse practitioner and by MD. Note/plan to be edited by MD as needed.  Pager: [phone]   Neurology Attending Attestation   I examined the patient and discussed plan with NP. Above note has been edited by me to reflect my findings and recommendations. I was present throughout the stroke code and made all significant decisions and personally reviewed CNS imaging and also discussed CTA results with radiologist by phone. Patient states that her RLE is weak at baseline, but slightly worse today. Patient likely had a small stroke, but exam not c/w LVO, therefore CTA/CTP not performed particularly in light of her creatinine of 3.25 in her post-transplant kidney. She was not a TNK candidate 2/2 prior ICH. She had recent GIB from unclear  source, and is anemic therefore will start ASA 81mg  daily only. Admit for stroke workup above.   Su Monks, MD [facility] [phone]   If 7pm- 7am, please page neurology on call as listed in [place].

## 2021-07-24 ENCOUNTER — Observation Stay (HOSPITAL_COMMUNITY): Payer: Medicare Other

## 2021-07-24 DIAGNOSIS — R29898 Other symptoms and signs involving the musculoskeletal system: Secondary | ICD-10-CM | POA: Diagnosis not present

## 2021-07-24 DIAGNOSIS — Z86718 Personal history of other venous thrombosis and embolism: Secondary | ICD-10-CM

## 2021-07-24 DIAGNOSIS — K269 Duodenal ulcer, unspecified as acute or chronic, without hemorrhage or perforation: Secondary | ICD-10-CM | POA: Diagnosis not present

## 2021-07-24 DIAGNOSIS — I6389 Other cerebral infarction: Secondary | ICD-10-CM | POA: Diagnosis not present

## 2021-07-24 DIAGNOSIS — J4540 Moderate persistent asthma, uncomplicated: Secondary | ICD-10-CM

## 2021-07-24 DIAGNOSIS — R600 Localized edema: Secondary | ICD-10-CM | POA: Diagnosis not present

## 2021-07-24 DIAGNOSIS — I480 Paroxysmal atrial fibrillation: Secondary | ICD-10-CM

## 2021-07-24 DIAGNOSIS — Z9889 Other specified postprocedural states: Secondary | ICD-10-CM

## 2021-07-24 DIAGNOSIS — I10 Essential (primary) hypertension: Secondary | ICD-10-CM | POA: Diagnosis not present

## 2021-07-24 DIAGNOSIS — G8191 Hemiplegia, unspecified affecting right dominant side: Secondary | ICD-10-CM | POA: Diagnosis not present

## 2021-07-24 DIAGNOSIS — K922 Gastrointestinal hemorrhage, unspecified: Secondary | ICD-10-CM

## 2021-07-24 DIAGNOSIS — Z7189 Other specified counseling: Secondary | ICD-10-CM

## 2021-07-24 DIAGNOSIS — N185 Chronic kidney disease, stage 5: Secondary | ICD-10-CM

## 2021-07-24 DIAGNOSIS — G4733 Obstructive sleep apnea (adult) (pediatric): Secondary | ICD-10-CM

## 2021-07-24 DIAGNOSIS — Z940 Kidney transplant status: Secondary | ICD-10-CM

## 2021-07-24 DIAGNOSIS — I5042 Chronic combined systolic (congestive) and diastolic (congestive) heart failure: Secondary | ICD-10-CM

## 2021-07-24 DIAGNOSIS — I6930 Unspecified sequelae of cerebral infarction: Secondary | ICD-10-CM

## 2021-07-24 DIAGNOSIS — E876 Hypokalemia: Secondary | ICD-10-CM

## 2021-07-24 DIAGNOSIS — I639 Cerebral infarction, unspecified: Secondary | ICD-10-CM | POA: Diagnosis not present

## 2021-07-24 LAB — URINALYSIS, MICROSCOPIC (REFLEX)

## 2021-07-24 LAB — BASIC METABOLIC PANEL
Anion gap: 9 (ref 5–15)
BUN: 31 mg/dL — ABNORMAL HIGH (ref 8–23)
CO2: 20 mmol/L — ABNORMAL LOW (ref 22–32)
Calcium: 8.4 mg/dL — ABNORMAL LOW (ref 8.9–10.3)
Chloride: 110 mmol/L (ref 98–111)
Creatinine, Ser: 3.18 mg/dL — ABNORMAL HIGH (ref 0.44–1.00)
GFR, Estimated: 15 mL/min — ABNORMAL LOW (ref >60)
Glucose, Bld: 92 mg/dL (ref 70–99)
Potassium: 2.8 mmol/L — ABNORMAL LOW (ref 3.5–5.1)
Sodium: 139 mmol/L (ref 135–145)

## 2021-07-24 LAB — URINALYSIS, ROUTINE W REFLEX MICROSCOPIC
Bilirubin Urine: NEGATIVE
Glucose, UA: NEGATIVE mg/dL
Hgb urine dipstick: NEGATIVE
Ketones, ur: NEGATIVE mg/dL
Leukocytes,Ua: NEGATIVE
Nitrite: NEGATIVE
Protein, ur: 100 mg/dL — AB
Specific Gravity, Urine: 1.01 (ref 1.005–1.030)
pH: 6 (ref 5.0–8.0)

## 2021-07-24 LAB — CBC
HCT: 29.3 % — ABNORMAL LOW (ref 36.0–46.0)
Hemoglobin: 8.9 g/dL — ABNORMAL LOW (ref 12.0–15.0)
MCH: 26.8 pg (ref 26.0–34.0)
MCHC: 30.4 g/dL (ref 30.0–36.0)
MCV: 88.3 fL (ref 80.0–100.0)
Platelets: 227 10*3/uL (ref 150–400)
RBC: 3.32 MIL/uL — ABNORMAL LOW (ref 3.87–5.11)
RDW: 20.5 % — ABNORMAL HIGH (ref 11.5–15.5)
WBC: 7 10*3/uL (ref 4.0–10.5)
nRBC: 0 % (ref 0.0–0.2)

## 2021-07-24 LAB — ECHOCARDIOGRAM COMPLETE
Area-P 1/2: 4.39 cm2
Calc EF: 36.3 %
S' Lateral: 3.4 cm
Single Plane A2C EF: 40.9 %
Single Plane A4C EF: 29.5 %

## 2021-07-24 LAB — LIPID PANEL
Cholesterol: 124 mg/dL (ref 0–200)
HDL: 34 mg/dL — ABNORMAL LOW (ref >40)
LDL Cholesterol: 71 mg/dL (ref 0–99)
Total CHOL/HDL Ratio: 3.6 RATIO
Triglycerides: 95 mg/dL (ref <150)
VLDL: 19 mg/dL (ref 0–40)

## 2021-07-24 IMAGING — MR MR MRA HEAD W/O CM
1 series · 18 of 48 positions shown · non-contrast
Comparison: CT head [DATE].

CLINICAL DATA: Neuro deficit, acute, stroke suspected



[Series 3: TOF · axial · 1.0mm · 0.43mm/px · z∈[-80,+7]mm · 18 of 188 slices shown]
[im 1/188]
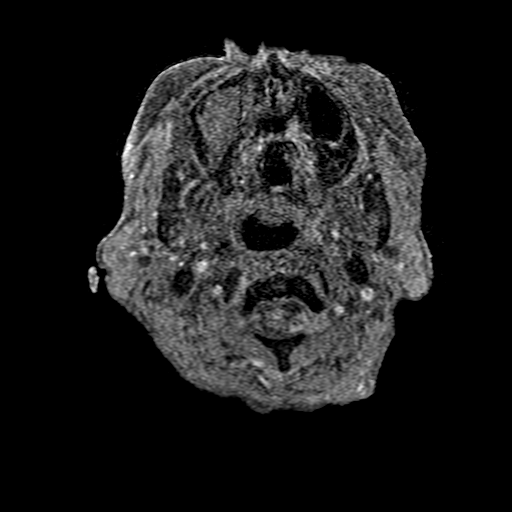
[im 4/188]
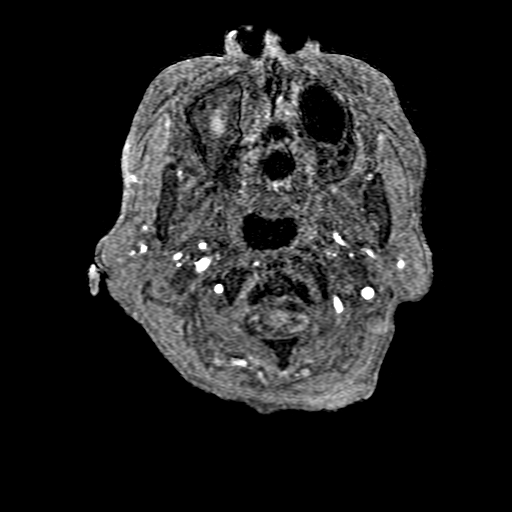
[im 8/188]
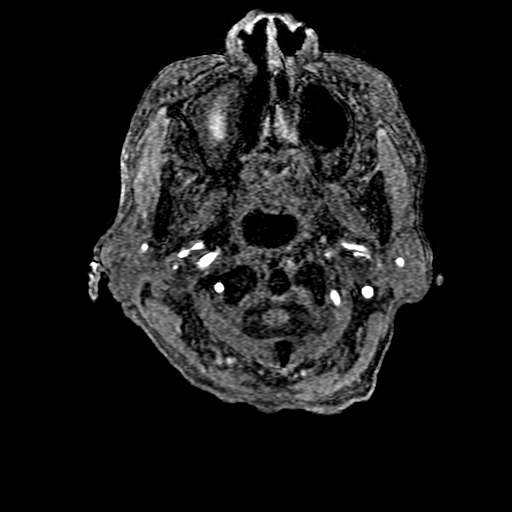
[im 12/188]
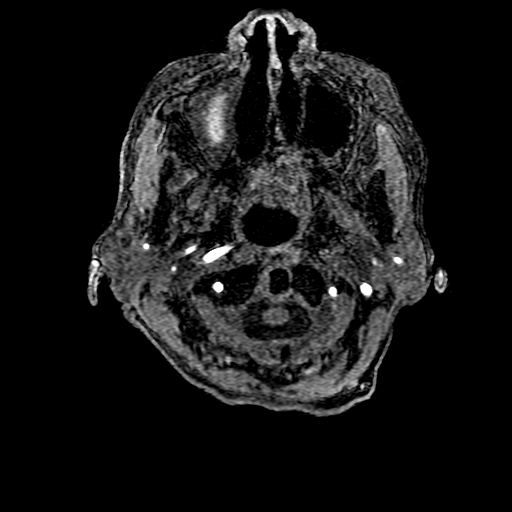
[im 16/188]
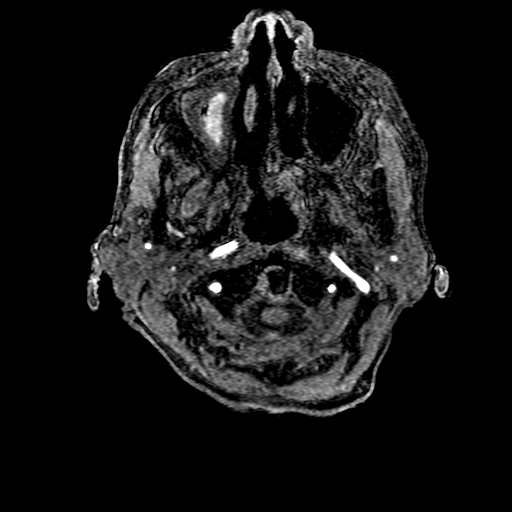
[im 20/188]
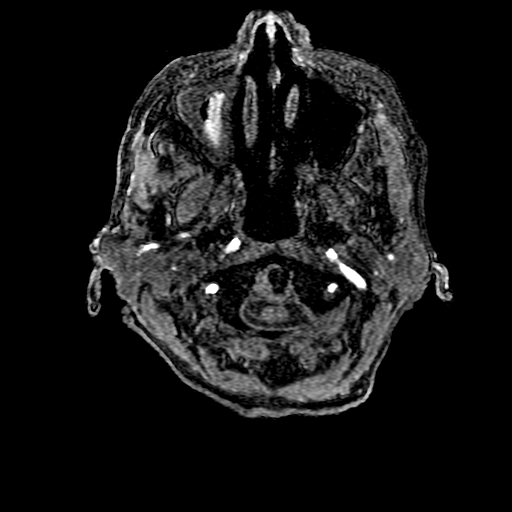
[im 24/188]
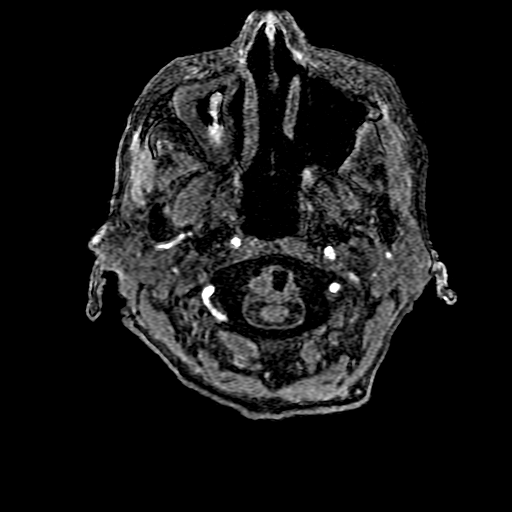
[im 28/188]
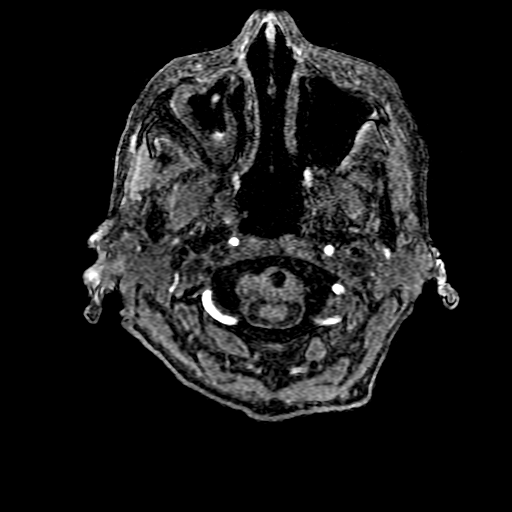
[im 32/188]
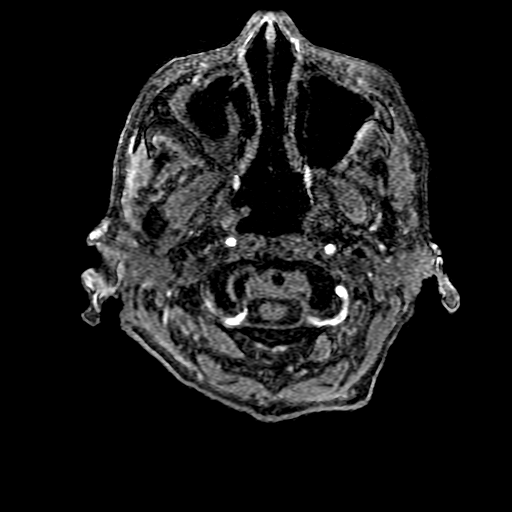
[im 36/188]
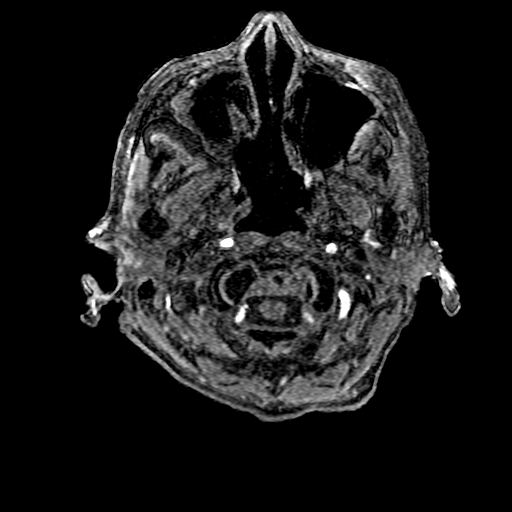
[im 60/188]
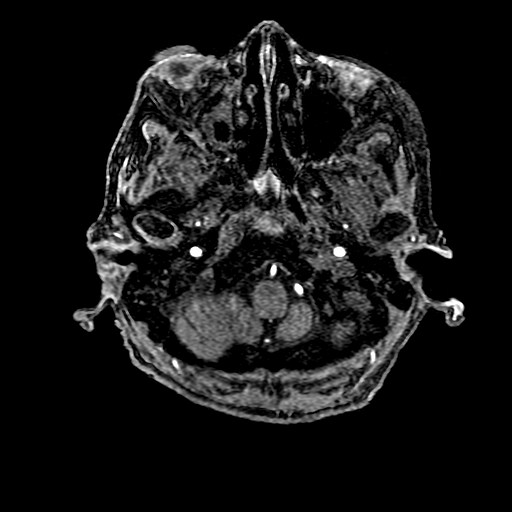
[im 84/188]
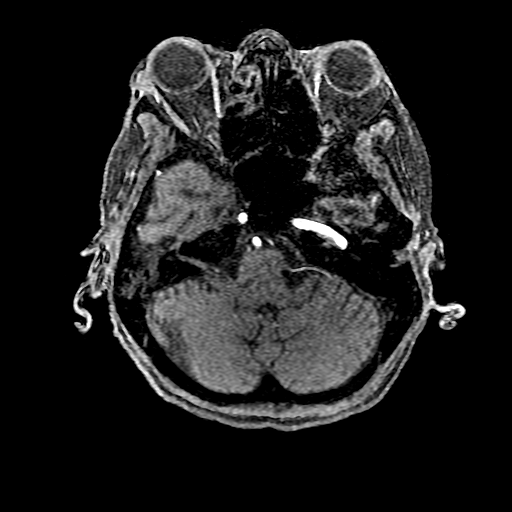
[im 96/188]
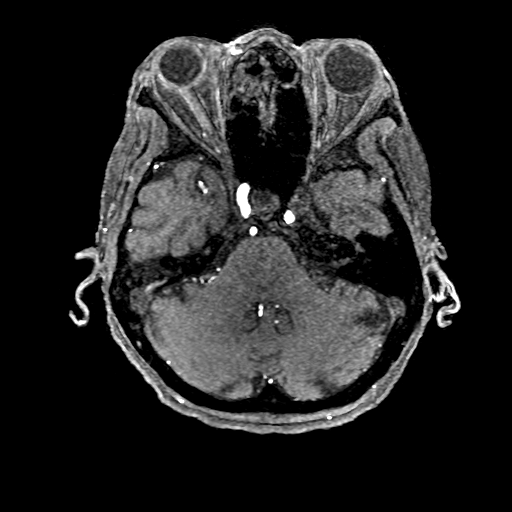
[im 108/188]
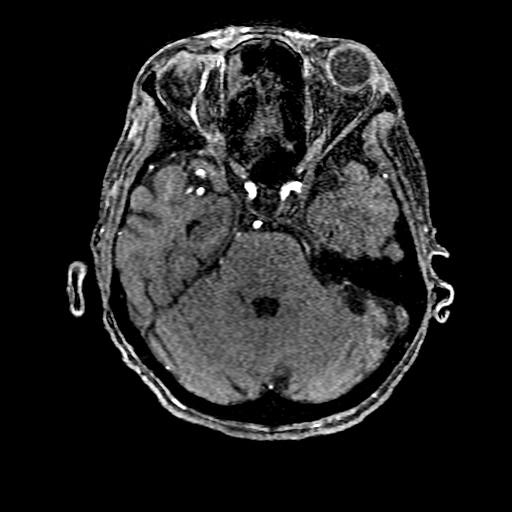
[im 132/188]
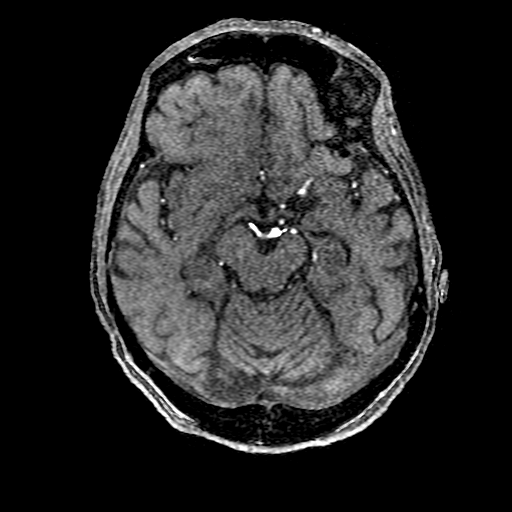
[im 156/188]
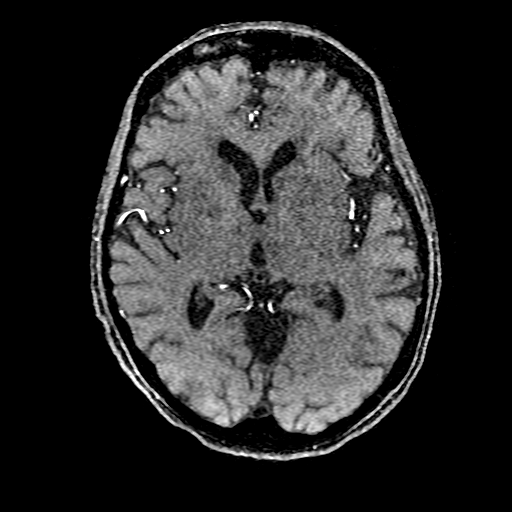
[im 160/188]
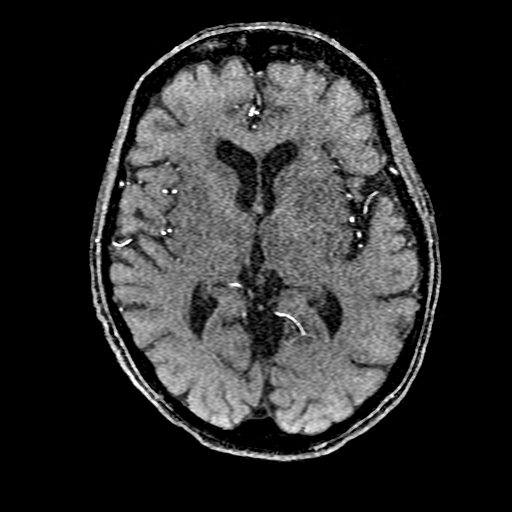
[im 180/188]
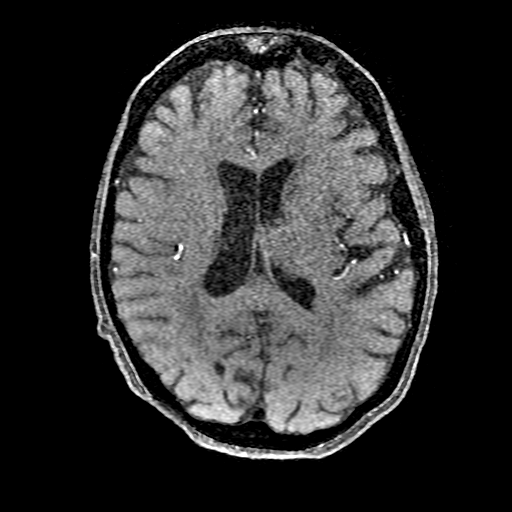

[18 of 48 positions shown; findings below may reference images not displayed]

FINDINGS: MRI HEAD FINDINGS

Brain: Acute infarct in the left thalamus. Punctate infarcts in the
bilateral frontal lobes (series 4, image 35). Associated edema
without mass effect. Additional patchy and confluent T2/FLAIR
hyperintense signal within the supratentorial and pontine white
matter, nonspecific but compatible with moderate chronic
microvascular ischemic disease. Encephalomalacia in the anterior
left temporal lobe and high left perirolandic cortex. Evidence of
prior hemorrhage in the periventricular right frontal lobe with
susceptibility artifact and T2 hypointensity. No evidence of acute
hemorrhage. No hydrocephalus, mass lesion, midline shift, or
extra-axial fluid collection

Vascular: See below.

Skull and upper cervical spine: Degenerative change at the
craniocervical junction with pannus along the dorsal dens. No focal
marrow replacing lesion.

Sinuses/Orbits: Moderate mucosal thickening of the right maxillary
sinus, anterior right ethmoid air cells, and right frontal sinus.

Other: Moderate right mastoid effusion.

MRA HEAD FINDINGS

Anterior circulation: Bilateral intracranial ICAs, MCAs, and ACAs
are patent without proximal high-grade stenosis.

Posterior circulation: Bilateral vertebral arteries, basilar artery
and posterior cerebral arteries are patent without proximal
high-grade stenosis.

MRA NECK FINDINGS

Great vessel origins are not imaged. Nondiagnostic evaluation of the
proximal arteries in the lower neck. Visible common carotid arteries
and internal carotid arteries are patent without evidence of
significant (greater than 50%) stenosis. Visible vertebral arteries
are patent with out evidence of significant (greater than 50%)
stenosis. The vertebral arteries are codominant.
IMPRESSION: MRI:

1. Acute infarct in the left thalamus. Punctate acute infarcts in
the frontal lobes bilaterally. Given involvement of multiple
vascular territories, consider a central embolic etiology.
2. Encephalomalacia in the anterior left temporal lobe and high left
perirolandic cortex.
3. Moderate chronic microvascular ischemic disease.
4. Prior hemorrhage in the periventricular right frontal lobe.

MRA head:

No large vessel occlusion or proximal high-grade stenosis.

MRA neck:

No visible occlusion or significant stenosis with limitations
described above.

## 2021-07-24 IMAGING — MR MR MRA NECK W/O CM
1 series · 18 of 48 positions shown · non-contrast
Comparison: CT head [DATE].

CLINICAL DATA: Neuro deficit, acute, stroke suspected



[Series 15: ax (id) · axial · 2.8mm · 0.47mm/px · z∈[-201,-62]mm · 18 of 108 slices shown]
[im 1/108]
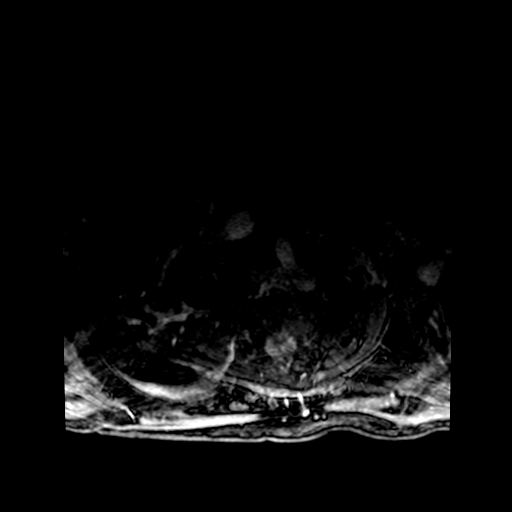
[im 3/108]
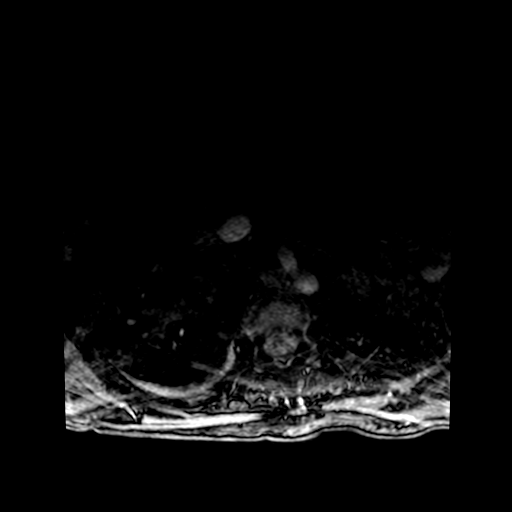
[im 5/108]
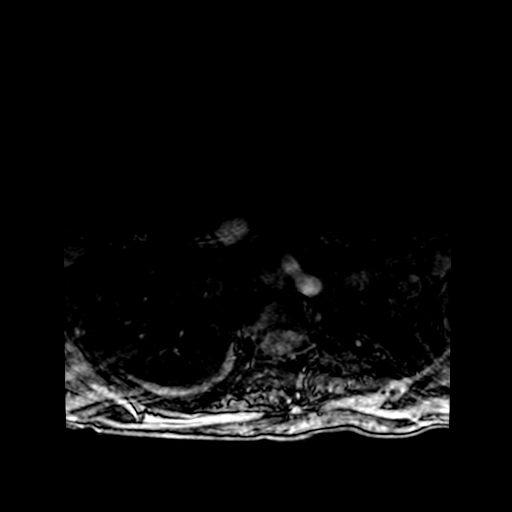
[im 7/108]
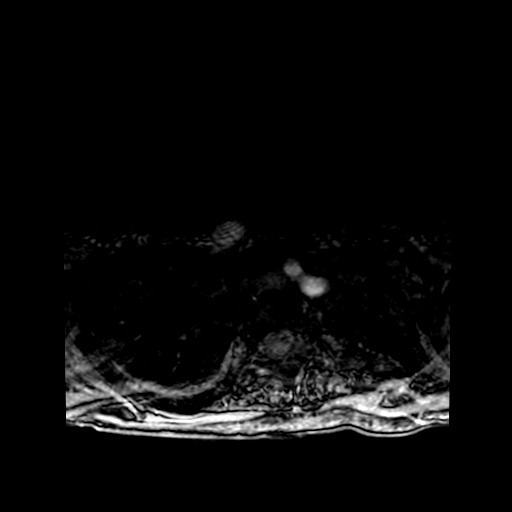
[im 10/108]
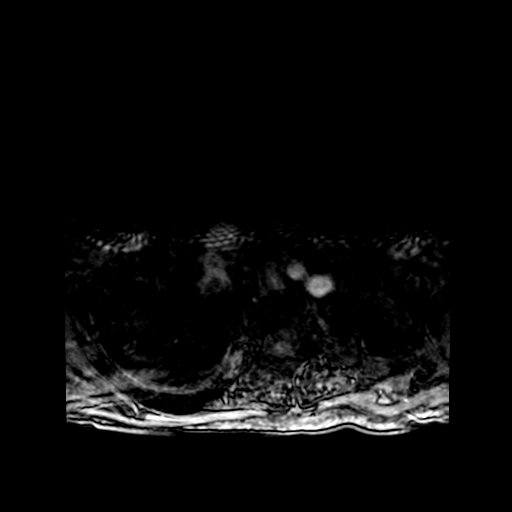
[im 12/108]
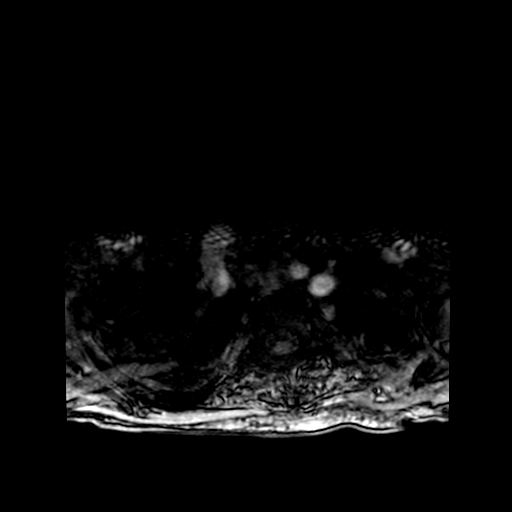
[im 14/108]
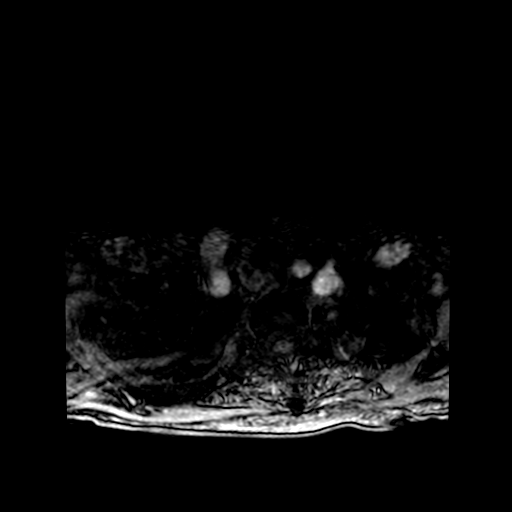
[im 16/108]
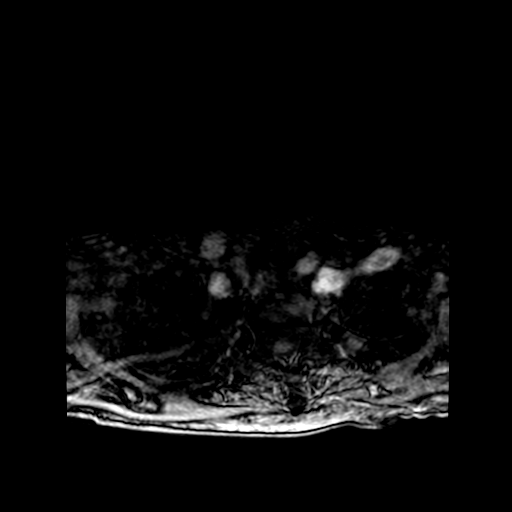
[im 19/108]
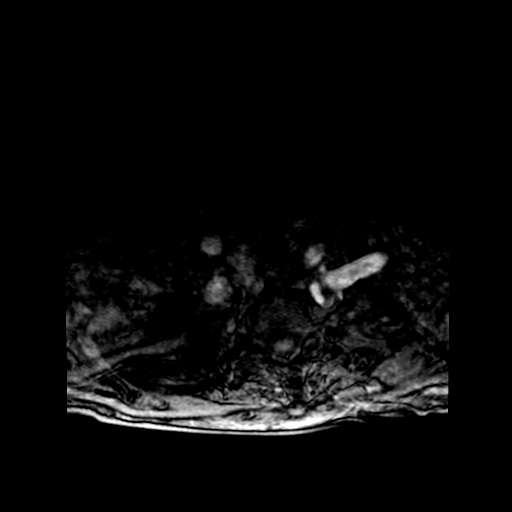
[im 21/108]
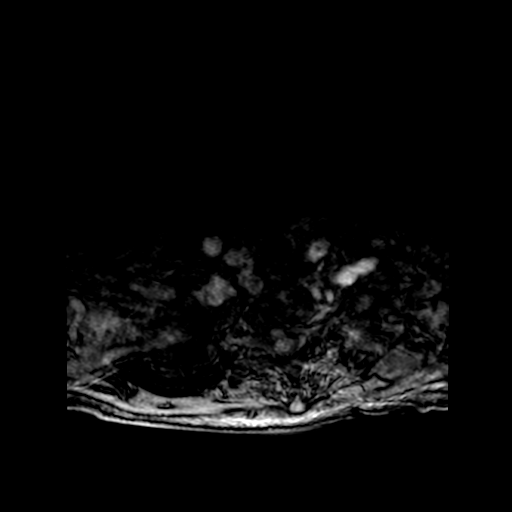
[im 35/108]
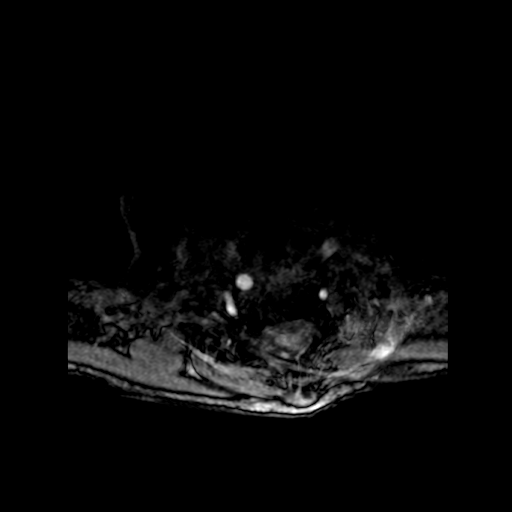
[im 48/108]
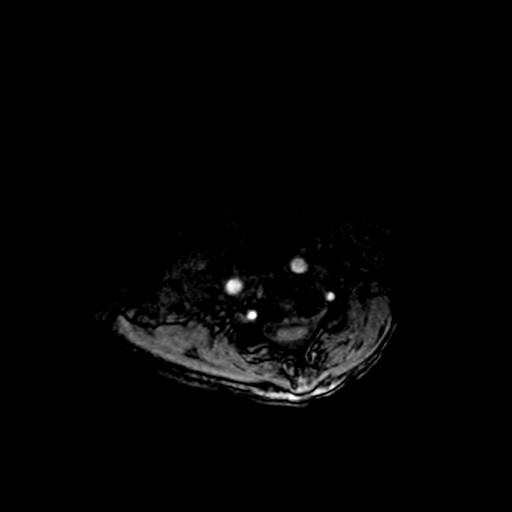
[im 55/108]
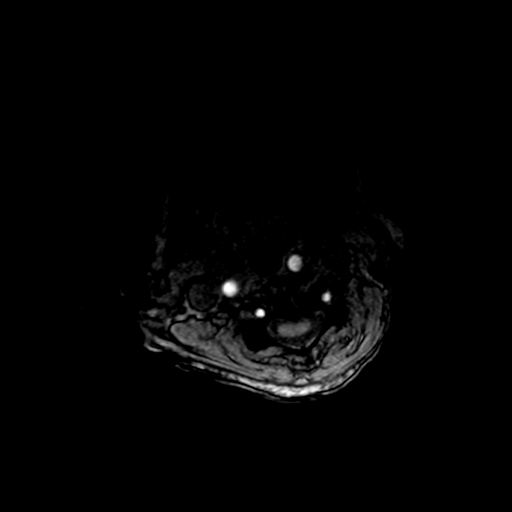
[im 62/108]
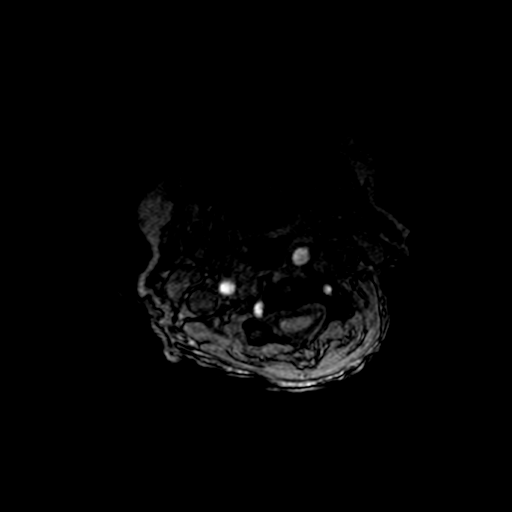
[im 76/108]
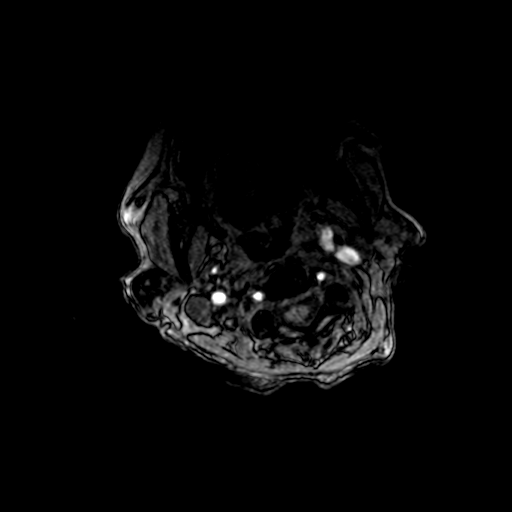
[im 89/108]
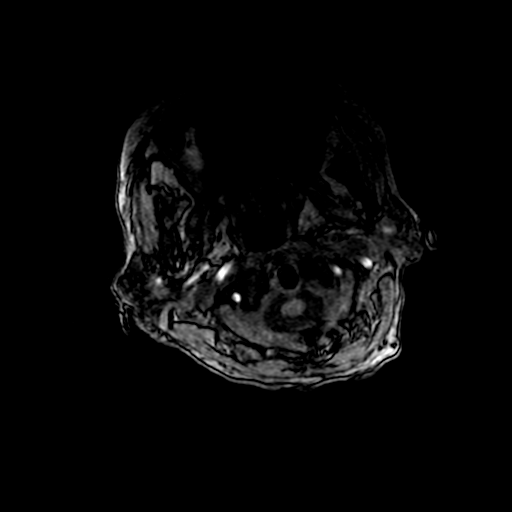
[im 92/108]
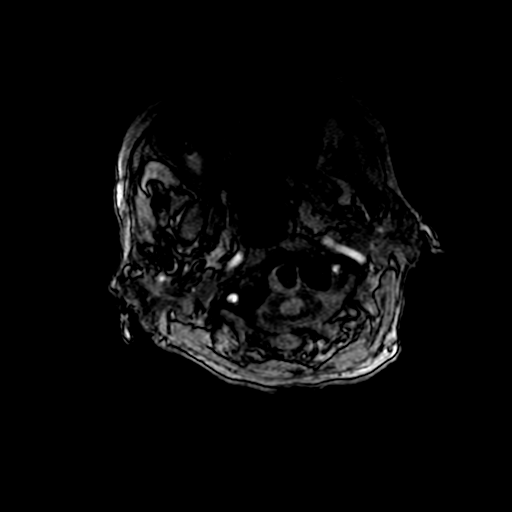
[im 103/108]
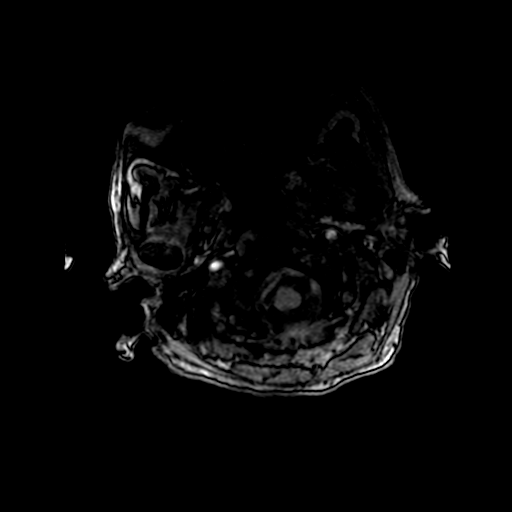

[18 of 48 positions shown; findings below may reference images not displayed]

FINDINGS: MRI HEAD FINDINGS

Brain: Acute infarct in the left thalamus. Punctate infarcts in the
bilateral frontal lobes (series 4, image 35). Associated edema
without mass effect. Additional patchy and confluent T2/FLAIR
hyperintense signal within the supratentorial and pontine white
matter, nonspecific but compatible with moderate chronic
microvascular ischemic disease. Encephalomalacia in the anterior
left temporal lobe and high left perirolandic cortex. Evidence of
prior hemorrhage in the periventricular right frontal lobe with
susceptibility artifact and T2 hypointensity. No evidence of acute
hemorrhage. No hydrocephalus, mass lesion, midline shift, or
extra-axial fluid collection

Vascular: See below.

Skull and upper cervical spine: Degenerative change at the
craniocervical junction with pannus along the dorsal dens. No focal
marrow replacing lesion.

Sinuses/Orbits: Moderate mucosal thickening of the right maxillary
sinus, anterior right ethmoid air cells, and right frontal sinus.

Other: Moderate right mastoid effusion.

MRA HEAD FINDINGS

Anterior circulation: Bilateral intracranial ICAs, MCAs, and ACAs
are patent without proximal high-grade stenosis.

Posterior circulation: Bilateral vertebral arteries, basilar artery
and posterior cerebral arteries are patent without proximal
high-grade stenosis.

MRA NECK FINDINGS

Great vessel origins are not imaged. Nondiagnostic evaluation of the
proximal arteries in the lower neck. Visible common carotid arteries
and internal carotid arteries are patent without evidence of
significant (greater than 50%) stenosis. Visible vertebral arteries
are patent with out evidence of significant (greater than 50%)
stenosis. The vertebral arteries are codominant.
IMPRESSION: MRI:

1. Acute infarct in the left thalamus. Punctate acute infarcts in
the frontal lobes bilaterally. Given involvement of multiple
vascular territories, consider a central embolic etiology.
2. Encephalomalacia in the anterior left temporal lobe and high left
perirolandic cortex.
3. Moderate chronic microvascular ischemic disease.
4. Prior hemorrhage in the periventricular right frontal lobe.

MRA head:

No large vessel occlusion or proximal high-grade stenosis.

MRA neck:

No visible occlusion or significant stenosis with limitations
described above.

## 2021-07-24 MED ORDER — POTASSIUM CHLORIDE CRYS ER 20 MEQ PO TBCR
40.0000 meq | EXTENDED_RELEASE_TABLET | ORAL | Status: AC
  Administered 2021-07-24 (×2): 40 meq via ORAL
  Filled 2021-07-24 (×2): qty 2

## 2021-07-24 MED ORDER — ATORVASTATIN CALCIUM 10 MG PO TABS
20.0000 mg | ORAL_TABLET | Freq: Every day | ORAL | Status: DC
  Administered 2021-07-24 – 2021-07-26 (×3): 20 mg via ORAL
  Filled 2021-07-24 (×3): qty 2

## 2021-07-24 MED ORDER — LEVALBUTEROL HCL 0.63 MG/3ML IN NEBU: 0.6300 mg | INHALATION_SOLUTION | Freq: Four times a day (QID) | RESPIRATORY_TRACT | Status: DC | PRN

## 2021-07-24 NOTE — Evaluation (Signed)
Physical Therapy Evaluation Patient Details Name: [name] MRN: [id number] DOB: 04/18/1946 Today's Date: 07/24/2021  History of Present Illness  76 yo female presenting to ED on 1/8 with R lleg weakness. CT showing small vessel disease in the left thalamus and possibly also pons. Awaiting MRI. Recent admission for GI bleed on 07/07/21-07/13/21. PMH including ESRD s/p renal transplant 2017 on chronic immunosuppression, Afib, PPM COVID 8/27-8/29 admission, PE / DVT/ IVC complicated by [place] 5400, diverticulosis 11/2019, CVA (2009), and depression.  Clinical Impression  Pt admitted secondary to problem above with deficits below. Pt requiring min A +2 for transfers and to take side steps at EOB. Pt with increased weakness and fatiguing easily so further mobility deferred. Per husband, pt has had increased difficulty ambulating and taking care of herself. Recommending SNF level therapies to address current deficits. Will continue to follow acutely.        Recommendations for follow up therapy are one component of a multi-disciplinary discharge planning process, led by the attending physician.  Recommendations may be updated based on patient status, additional functional criteria and insurance authorization.  Follow Up Recommendations Skilled nursing-short term rehab (<3 hours/day)    Assistance Recommended at Discharge Frequent or constant Supervision/Assistance  Patient can return home with the following  A lot of help with walking and/or transfers;A lot of help with bathing/dressing/bathroom    Equipment Recommendations None recommended by PT  Recommendations for Other Services       Functional Status Assessment Patient has had a recent decline in their functional status and demonstrates the ability to make significant improvements in function in a reasonable and predictable amount of time.     Precautions / Restrictions Precautions Precautions: Fall Restrictions Weight Bearing  Restrictions: No      Mobility  Bed Mobility Overal bed mobility: Needs Assistance Bed Mobility: Supine to Sit;Sit to Supine     Supine to sit: Mod assist;+2 for physical assistance Sit to supine: Mod assist;+2 for physical assistance   General bed mobility comments: Required assist for trunk and LEs    Transfers Overall transfer level: Needs assistance Equipment used: 2 person hand held assist Transfers: Sit to/from Stand Sit to Stand: Min assist;+2 physical assistance;+2 safety/equipment           General transfer comment: Min A +2 to stand for lift assist and steadying. Pt required seated rest after short bout of standing. On second attempt, pt able to take side steps with min A +2 but fatigued easily and required seated rest.    Ambulation/Gait                  Stairs            Wheelchair Mobility    Modified Rankin (Stroke Patients Only)       Balance Overall balance assessment: Needs assistance Sitting-balance support: No upper extremity supported;Feet supported Sitting balance-Leahy Scale: Fair     Standing balance support: Bilateral upper extremity supported Standing balance-Leahy Scale: Poor Standing balance comment: Reliant on BUE and external support                             Pertinent Vitals/Pain Pain Assessment: No/denies pain    Home Living Family/patient expects to be discharged to:: Private residence Living Arrangements: Spouse/significant other Available Help at Discharge: Family;Available 24 hours/day Type of Home: House Home Access: Ramped entrance;Stairs to enter Entrance Stairs-Rails: Left Entrance Stairs-Number of Steps: 5in the  back, 3 in the front   Home Layout: One level Home Equipment: Transport planner;Wheelchair - Banker;Adaptive equipment;Hand held shower head;Tub bench;BSC/3in1;Cane - single point;Rolling Walker (2 wheels);Grab bars - tub/shower      Prior Function Prior Level  of Function : Needs assist             Mobility Comments: Uses RW for ambulation. Has reported increased difficulty with ambulation ADLs Comments: Has needed help with transfers and bathing/dressing.     Hand Dominance   Dominant Hand: Right    Extremity/Trunk Assessment   Upper Extremity Assessment Upper Extremity Assessment: Defer to OT evaluation    Lower Extremity Assessment Lower Extremity Assessment: RLE deficits/detail RLE Deficits / Details: Grossly 3/5 throughout. Increased swelling noted as well.    Cervical / Trunk Assessment Cervical / Trunk Assessment: Kyphotic  Communication   Communication: No difficulties  Cognition Arousal/Alertness: Awake/alert Behavior During Therapy: WFL for tasks assessed/performed Overall Cognitive Status: Impaired/Different from baseline Area of Impairment: Orientation;Problem solving;Awareness;Safety/judgement;Following commands;Memory;Attention                 Orientation Level: Disoriented to;Time Current Attention Level: Selective Memory: Decreased short-term memory Following Commands: Follows one step commands consistently;Follows one step commands with increased time Safety/Judgement: Decreased awareness of safety;Decreased awareness of deficits Awareness: Emergent Problem Solving: Slow processing;Decreased initiation General Comments: Reports it was 1919 and then 1974. Cues for sequencing. Required encouragement to participate        General Comments General comments (skin integrity, edema, etc.): Pt's husband present and very concerned about pt returning home given current state.    Exercises     Assessment/Plan    PT Assessment Patient needs continued PT services  PT Problem List Decreased strength;Decreased range of motion;Decreased mobility;Decreased activity tolerance;Decreased balance;Decreased knowledge of use of DME;Decreased cognition;Decreased safety awareness       PT Treatment Interventions  DME instruction;Gait training;Therapeutic activities;Therapeutic exercise;Patient/family education;Balance training;Functional mobility training    PT Goals (Current goals can be found in the Care Plan section)  Acute Rehab PT Goals Patient Stated Goal: to ge to SNF per pt's husband PT Goal Formulation: With patient/family Time For Goal Achievement: 08/07/21 Potential to Achieve Goals: Good    Frequency Min 3X/week     Co-evaluation PT/OT/SLP Co-Evaluation/Treatment: Yes Reason for Co-Treatment: Necessary to address cognition/behavior during functional activity;For patient/therapist safety PT goals addressed during session: Mobility/safety with mobility;Balance         AM-PAC PT "6 Clicks" Mobility  Outcome Measure Help needed turning from your back to your side while in a flat bed without using bedrails?: A Little Help needed moving from lying on your back to sitting on the side of a flat bed without using bedrails?: Total Help needed moving to and from a bed to a chair (including a wheelchair)?: Total Help needed standing up from a chair using your arms (e.g., wheelchair or bedside chair)?: Total Help needed to walk in hospital room?: Total Help needed climbing 3-5 steps with a railing? : Total 6 Click Score: 8    End of Session Equipment Utilized During Treatment: Gait belt Activity Tolerance: Patient limited by fatigue Patient left: in bed;with call bell/phone within reach;with family/visitor present (on stretcher in ED) Nurse Communication: Mobility status PT Visit Diagnosis: Muscle weakness (generalized) (M62.81);Difficulty in walking, not elsewhere classified (R26.2)    Time: 2130-8657 PT Time Calculation (min) (ACUTE ONLY): 20 min   Charges:   PT Evaluation $PT Eval Moderate Complexity: 1 Mod  Lou Miner, DPT  Acute Rehabilitation Services  Pager: [phone] Office: [phone]   Rudean Hitt 07/24/2021, 12:29 PM

## 2021-07-24 NOTE — Progress Notes (Signed)
Per order, Changed device settings for MRI to VOO at 85 bpm   Tachy-therapies to off if applicable.   Will program device back to pre-MRI settings after completion of exam

## 2021-07-24 NOTE — Progress Notes (Signed)
Informed of MRI for today.   Device system confirmed to be MRI conditional, with implant date > 6 weeks ago, and no evidence of abandoned or epicardial leads in review of most recent CXR Interrogation from today reviewed, pt is currently VP at ~72 bpm Change device settings for MRI to VOO at 85 bpm  Tachy-therapies to off if applicable.  Program device back to pre-MRI settings after completion of exam.  Annamaria Helling  07/24/2021 1:26 PM

## 2021-07-24 NOTE — Care Management Obs Status (Signed)
[place] NOTIFICATION   Patient Details  Name: [name] MRN: [id number] Date of Birth: 07-Jul-1946   Medicare Observation Status Notification Given:       KAYSON, BULLIS, RN 07/24/2021, 6:09 PM

## 2021-07-24 NOTE — ED Notes (Signed)
PT, OT at bedside worked with pt and spouse here.

## 2021-07-24 NOTE — Progress Notes (Signed)
°  Echocardiogram 2D Echocardiogram has been performed.  [name] 07/24/2021, 11:01 AM

## 2021-07-24 NOTE — Progress Notes (Addendum)
STROKE TEAM PROGRESS NOTE   ATTENDING NOTE: I reviewed above note and agree with the assessment and plan. Pt was seen and examined.   76 year old female with history of CHF, recent GI bleeding, ESRD s/p renal transplant in 2017 now CKD 5, A. fib status post ablation and LAA occlusion, pacemaker, DVT/PE 2013, right thalamic [place] 2009 with no significant residual admitted for right-sided weakness, right facial droop and speech difficulty.  CT showed chronic right thalamic hypodensity.  MRI showed left thalamic and punctate bilateral frontal infarcts, concerning for central emboli source.  MRA head and neck unremarkable.  LE venous Doppler negative at this time.  EF 35 to 40%.  LDL 71, A1c 4.7, creatinine 3.18, hemoglobin 8.9.  On exam, patient husband and daughter [name] at bedside.  Patient awake alert, pleasant, orientated to age, people, but not to time.  Mild expressive aphasia with intermittent word salad, however able to name and repeat, follow simple commands.  Visual field full.  No gaze palsy, right mild upper and lower facial weakness, tongue midline.  Left upper extremity no drift, left upper extremity pronator drift, and finger grip 4/5 on the left.  Bilateral lower extremity 4/5 proximal and distally.  Sensation symmetrical, finger-to-nose grossly intact bilaterally.  Gait not tested  Etiology for patient stroke concerning for cardioembolic source, especially A. fib not on AC.  However, patient had recent GI bleeding with hemoglobin down to 5.8 required 4 units PRBC transfusion.  GI work-up concerning for diverticular source.  Aspirin discontinued on last discharge.  Discussed with Dr. Cyndia [name], there is dilemma on antithrombotic use in thrombus recent GI bleeding versus embolic stroke due to A. fib not on AC.  Will discuss further with GI service to see if GI can clear her for Eliquis.  If Eliquis used, will do 2.5 mg twice daily given low body weight < 60 kg and high creatinine > 1.5.  On low-dose  statin.  PT and OT recommended SNF.  We will follow.  For detailed assessment and plan, please refer to above as I have made changes wherever appropriate.   Jane Hawking, MD PhD Stroke Neurology 07/24/2021 3:29 PM    INTERVAL HISTORY Her husband is at the bedside.  Patient reports having sudden right sided weakness and dysarthria. Husband also reports patient's right eye appeared to be protruding further than usual and she appeared more confused than usual. Patient reports she is somewhat better with regards to weakness but continues to have some right sided weakness of face and extremities. Husband also reports patient continues to be tangential and confused at times.   Discussed plan to have MRI done should patient's pacemaker be MRI-compatible. In addition, discussed at length that given her recent GI bleed, there needs to be discussion with hospitalist and patient as to the appropriateness of starting blood thinner. Patient agreeable to plan and all questions were answered.   Vitals:   07/24/21 0700 07/24/21 0800 07/24/21 0900 07/24/21 1000  BP: (!) 154/86 (!) 147/81 (!) 151/81 (!) 139/92  Pulse: 70 73 71 69  Resp: (!) 25 (!) 26 (!) 24 (!) 23  Temp:      TempSrc:      SpO2: 100% 100% 100% 100%   CBC:  Recent Labs  Lab 07/21/21 1201 07/23/21 1043 07/23/21 1108 07/24/21 0515  WBC 6.4 8.2  --  7.0  NEUTROABS 5.0 6.6  --   --   HGB 9.1* 9.5* 11.2* 8.9*  HCT 28.9* 32.0* 33.0* 29.3*  MCV 85.1  89.4  --  88.3  PLT 222.0 237  --  378   Basic Metabolic Panel:  Recent Labs  Lab 07/21/21 1201 07/23/21 1043 07/23/21 1108 07/24/21 0515  NA 140 138 141 139  K 3.1* 4.5 3.7 2.8*  CL 109 110 111 110  CO2 22 20*  --  20*  GLUCOSE 105* 101* 91 92  BUN 37* 31* 42* 31*  CREATININE 3.25* 3.09* 3.10* 3.18*  CALCIUM 8.4 8.2*  --  8.4*  MG 1.5  --   --   --    Lipid Panel:  Recent Labs  Lab 07/24/21 0515  CHOL 124  TRIG 95  HDL 34*  CHOLHDL 3.6  VLDL 19  LDLCALC 71   HgbA1c:   Recent Labs  Lab 07/23/21 1400  HGBA1C 4.7*   Urine Drug Screen: No results for input(s): LABOPIA, COCAINSCRNUR, LABBENZ, AMPHETMU, THCU, LABBARB in the last 168 hours.  Alcohol Level No results for input(s): ETH in the last 168 hours.  IMAGING past 24 hours No results found.  PHYSICAL EXAM  Temp:  [98.8 F (37.1 C)] 98.8 F (37.1 C) (01/08 1900) Pulse Rate:  [69-85] 69 (01/09 1000) Resp:  [12-28] 23 (01/09 1000) BP: (133-159)/(78-94) 139/92 (01/09 1000) SpO2:  [100 %] 100 % (01/09 1000)  General - Well nourished, well developed, in no apparent distress.  Cardiovascular - Regular rhythm and rate.  Mental Status -  Level of arousal and orientation to place and person were intact. Patient believes it is 38 Language including naming, repetition was assessed and found intact. Some deficits in comprehension and dysphagia present Attention span and concentration were normal. Recent and remote memory were relatively intact. Does not recall neurologic deficits associated with stroke. Fund of Knowledge was assessed and was intact.  Cranial Nerves II - XII - II - Visual field intact. III, IV, VI - Extraocular movements intact. V - Facial sensation intact bitlaterally. VII - Right facial droop, forehead included. VIII - Hearing & vestibular intact bilaterally. X - Palate elevates symmetrically. XI - Chin turning & shoulder shrug intact bilaterally. XII - Tongue protrusion intact.  Motor Strength - RUE pronator drift. RUE/RLE weakness.  Bulk was normal and fasciculations were absent.   Motor Tone - Muscle tone was assessed at the neck and appendages and was normal.  Reflexes - The patients reflexes were symmetrical in all extremities and she had no pathological reflexes.  Sensory - Light touch, temperature/pinprick were assessed and were symmetrical.    Coordination - The patient had normal movements in the hands and feet with no ataxia or dysmetria.  Tremor was  absent.  Gait and Station - deferred.  ASSESSMENT/PLAN [name] is a 76 y.o. female with history of CKD-5, ICH, Afib s/p ablation and LAA closure not on AC, AVB s/p PPM, DVT/PE s/p IVC filter, duodenal ulcer, diastolic CHF, asthma, OSA, depression and recent hospitalziation for BLA due to GI bleed presenting with RLE weakness, slurred speech, and confusion.   Stroke:  suspected Left brain stroke likely secondary due to Afib not on [facility] Code Stroke CT head No acute abnormality. Advanced chronic ischemic disease as well as small vessel disease in L thalamus and pons. ASPECTS 10.    MRI  pending MRA head and neck pending 2D Echo pending Lower Extremity US: no evidence of DVT bilaterally LDL 71 HgbA1c 4.7 VTE prophylaxis - SCDs No antithrombotic prior to admission, now on No antithrombotic.  Therapy recommendations:  SNF Disposition:  pending  AF not on [facility] s/p ablation/PPM implant, s/p LAAC Was on ASA 81 PTA holding AC for now due to recent GI bleeding required PRBC Will further discuss with Dr. Cyndia [name] and pt family  GIB Admitted 06/2021 Hb 5.8 needed PRBC 4 units EGD revealed esophageal ulcer, duodenal ulcer,  grade D LA erosive esophagitis and nonbleeding AVM treated with APC.  Flexible sigmoidoscopy showed blood in rectum, rectosigmoid colon and sigmoid colon with internal and external hemorrhoids.  Video capsule endoscopy normal. 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  No need of high intensity statin given LDL near goal and history of ICH. Continue statin on discharge  Other Stroke Risk Factors Advanced Age >/= 47   Obstructive sleep apnea, not on CPAP at home Congestive heart failure DVT/PE s/p IVC filter 2013  Other Active Problems Erosive Esophagitis: protonix, carafate CKD stage V s/p renal transplant - avoid ordering contrast based imaging Depression: continue cymbalta Right leg edema: DVT doppler negative  Hospital day # 0  France Ravens, MD PGY1 Resident  To contact Stroke Continuity provider, please refer to [email or web address] After hours, contact General Neurology

## 2021-07-24 NOTE — Progress Notes (Addendum)
PROGRESS NOTE  [name] VWU:[id number] DOB: 29-Nov-1945   PCP: [name], NP  Patient is from: Home.  Lives with husband.  DOA: 07/23/2021 LOS: 0  Chief complaints:  Chief Complaint  Patient presents with   Code Stroke     Brief Narrative / Interim history: 76 y.o. F with PMH of ESRD s/p renal transplant in 2017 now with CKD-5, ICH, A. Fib s/p ablation and LAA closure, AVB s/p PPM, DVT/PE s/p IVC filter, duodenal ulcer, diastolic CHF, asthma, OSA, depression and recent hospitalization for a BLA due to GI bleed with extensive work-up and blood transfusion.  She presented with acute onset of RLE weakness, slurred speech, dizziness and confusion, and admitted for suspected left-sided CVA.  CTH with age-indeterminate small vessel disease in left thalamus and possibly pounds and chronic right thalamic lacunar infarct.  Neurology consulted and recommended CVA work-up.  Patient is outside tPA window on arrival to ED.  Subjective: Seen and examined earlier this morning.  Patient's husband and neurology team at bedside.  Still with right-sided weakness and expressive aphasia.  She denies headache, vision change, palpitation, chest pain, dyspnea, GI or UTI symptoms.  She admits to GI bleed.  She had bloody stool about 2 days ago.  Husband has a picture of it, and it looked a little bit of blood stain in toilet commode water.  Objective: Vitals:   07/24/21 0700 07/24/21 0800 07/24/21 0900 07/24/21 1000  BP: (!) 154/86 (!) 147/81 (!) 151/81 (!) 139/92  Pulse: 70 73 71 69  Resp: (!) 25 (!) 26 (!) 24 (!) 23  Temp:      TempSrc:      SpO2: 100% 100% 100% 100%    Examination:  GENERAL: Frail looking elderly female.  Nontoxic. HEENT: MMM.  Vision and hearing grossly intact.  NECK: Supple.  No apparent JVD.  RESP: 100% on RA.  No IWOB.  Fair aeration bilaterally. CVS:  RRR. Heart sounds normal.  ABD/GI/GU: BS+. Abd soft, NTND.  MSK/EXT:  Moves extremities. No apparent deformity.  No edema.  SKIN: no apparent skin lesion or wound NEURO: Awake, alert and oriented to self, person, place, day and month but not date or year.  Notable expressive aphasia.  Notable right facial droop including eyelids.  Motor 3+/5 in RUE and RLE and 4+/5 in left side.  Diminished light sensation in RUE and RLE.  Finger-to-nose off on the right.  Pronator drift on the right.  Biceps and patellar reflex symmetric. PSYCH: Calm. Normal affect.   Procedures:  None  Microbiology summarized: GNFAO-13 and influenza PCR nonreactive.  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  Patient with history of CVA with right hemiparesis but worse on admission.  Oriented to self, person, place, day, month and situation.  CTH with age-indeterminate small vessel disease in left thalamus and possibly pounds and chronic right thalamic lacunar infarct.  Neuro exam as above.  RLE [name] negative for DVT.  She has a history of A. fib with risk for embolic CVA.  A1c 4.7 but could be falsely low given recent blood transfusion.  LDL 71. -Neurology following -MRI brain/MRA head and neck if PPM is compatible -Follow TTE and telemetry monitoring -Continue low-dose aspirin-admitting provider discussed with GI, Dr. Henrene Pastor -Permissive hypertension?  Currently on Coreg. 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  Patient was not on anticoagulation due to GI bleed.  Neurology suggesting low-dose Eliquis if okay from GI standpoint.  Discussed with [facility], Dr. Benson Norway who is in agreement with low-dose Eliquis.  However, patient's husband is very concerned.  Plan for further discussion with daughters with patient, husband and daughter tomorrow morning.  Also, TTE with LVEF of 35 to 40%, R WMA,  indeterminate DD, severe TVR, RVSP of 61.3 mmHg.  Significant change from previous TTE in 10/2019, although she had an LVEF of 25 to 30%, moderate TVR and RVSP of 58 mmHg in 2019. -Nonurgent cardiology consult requested -Palliative medicine consulted   History of acute blood loss anemia due to diverticular bleed-extensive work-up last months. Erosive esophagitis/esophageal ulceration/duodenal ulceration: POA Recent Labs    07/12/21 0440 07/12/21 0840 07/12/21 1616 07/13/21 0038 07/13/21 0758 07/18/21 1114 07/21/21 1201 07/23/21 1043 07/23/21 1108 07/24/21 0515  HGB 7.5* 8.6* 8.5* 8.0* 8.4* 9.2* 9.1* 9.5* 11.2* 8.9*  -Protonix 40 mg twice daily for 1 month followed by 40 mg daily -Continue Carafate ACHS -f/u with GI outpatient    CKD-5/azotemia: Cr better than baseline.  Azotemia improved. Recent Labs    07/07/21 2333 07/09/21 0546 07/10/21 0434 07/11/21 0433 07/12/21 0440 07/13/21 0038 07/21/21 1201 07/23/21 1043 07/23/21 1108 07/24/21 0515  BUN >130* 146* 123* 121* 91* 83* 37* 31* 42* 31*  CREATININE 4.30* 4.13* 4.21* 3.75* 3.26* 3.26* 3.25* 3.09* 3.10* 3.18*  -Continue monitoring  History of renal transplant in 2017 -Continue home Prograf, prednisone and Myfortic.   Essential hypertension: BP slightly elevated. -Permissive hypertension? -Continue Coreg    Atrial fibrillation s/p AVN ablation/PPM implant, s/p LAAC with Watchman-POA -Continue telemetry -Off AC due to recent GI bleed   Asthmatic bronchitis without complication- (present on admission) -Stable, continue symbicort -Change as needed albuterol to Xopenex given risk for arrhythmia.  Chronic combined CHF: TTE in 10/2019 with LVEF of 55 to 60%, indeterminate DD.  No cardiopulmonary symptoms.  Appears euvolemic on exam except for RLE swelling.  RLE [name] negative for DVT.  Not on diuretics at home. -Monitor fluid status, renal functions and electrolytes   Depression: Stable. -Continue her cymbalta 20mg      Obstructive sleep apnea not compliant with CPAP -Declined CPAP here.   RLE edema: LE [name] negative for DVT.  Hypokalemia: K2.8. -P.o. KCl 40x2 -Check Mg  Goal of care counseling: Extended discussion about goal of care including CODE STATUS with patient, patient's husband at bedside and patient's daughter over the phone.  Discussed pros and cons of CPR and intubation, and slim chance of revival if she happens to be in [place].  I believe the risk of CPR and intubation outweigh the benefit.  However, patient prefers to remain full code.  Family supportive  -Continue full code -Palliative medicine consulted  There is no height or weight on file to calculate BMI.       Pressure Injury 07/08/21 Sacrum Stage 2 -  Partial thickness loss of dermis presenting as a shallow open injury with a red, pink wound bed without slough. (Active)  07/08/21 0358  Location: Sacrum  Location Orientation:   Staging: Stage 2 -  Partial thickness loss of dermis presenting as a shallow open injury with a red, pink wound bed without slough.  Wound Description (Comments):   Present on Admission: Yes   DVT prophylaxis:  Place TED hose Start: 07/23/21 1413  Code Status: Full code Family Communication: Updated patient's husband  at bedside and patient's daughter over the phone Level of care: Telemetry Medical Status is: Observation  The patient will require care spanning > 2 midnights and should be moved to inpatient because: Acute CVA with right-sided deficit,  further evaluation for CVA and need for safe disposition, SNF   Final disposition: Likely SNF   Consultants:  Neurology   Sch Meds:  Scheduled Meds:  aspirin EC  81 mg Oral Daily   atorvastatin  20 mg Oral Daily   carvedilol  6.25 mg Oral BID PC   DULoxetine  20 mg Oral Daily   fluticasone furoate-vilanterol  1 puff Inhalation Daily   mycophenolate  180 mg Oral BID   pantoprazole  40 mg Oral BID PC   potassium chloride  40 mEq Oral Q4H    predniSONE  5 mg Oral Daily   sodium bicarbonate  650 mg Oral BID   sodium chloride flush  3 mL Intravenous Once   sodium chloride flush  3 mL Intravenous Q12H   sucralfate  1 g Oral QID   tacrolimus  1 mg Oral BID   Continuous Infusions:  sodium chloride     PRN Meds:.sodium chloride, acetaminophen **OR** acetaminophen, levalbuterol, sodium chloride flush  Antimicrobials: Anti-infectives (From admission, onward)    None        I have personally reviewed the following labs and images: CBC: Recent Labs  Lab 07/18/21 1114 07/21/21 1201 07/23/21 1043 07/23/21 1108 07/24/21 0515  WBC  --  6.4 8.2  --  7.0  NEUTROABS  --  5.0 6.6  --   --   HGB 9.2* 9.1* 9.5* 11.2* 8.9*  HCT  --  28.9* 32.0* 33.0* 29.3*  MCV  --  85.1 89.4  --  88.3  PLT  --  222.0 237  --  227   BMP &GFR Recent Labs  Lab 07/21/21 1201 07/23/21 1043 07/23/21 1108 07/24/21 0515  NA 140 138 141 139  K 3.1* 4.5 3.7 2.8*  CL 109 110 111 110  CO2 22 20*  --  20*  GLUCOSE 105* 101* 91 92  BUN 37* 31* 42* 31*  CREATININE 3.25* 3.09* 3.10* 3.18*  CALCIUM 8.4 8.2*  --  8.4*  MG 1.5  --   --   --    Estimated Creatinine Clearance: 13.9 mL/min (A) (by C-G formula based on SCr of 3.18 mg/dL (H)). Liver & Pancreas: Recent Labs  Lab 07/23/21 1043  AST 47*  ALT 10  ALKPHOS 54  BILITOT 1.6*  PROT 4.6*  ALBUMIN 2.4*   No results for input(s): LIPASE, AMYLASE in the last 168 hours. No results for input(s): AMMONIA in the last 168 hours. Diabetic: Recent Labs    07/23/21 1400  HGBA1C 4.7*   Recent Labs  Lab 07/23/21 1043  GLUCAP 78   Cardiac Enzymes: No results for input(s): CKTOTAL, CKMB, CKMBINDEX, TROPONINI in the last 168 hours. No results for input(s): PROBNP in the last 8760 hours. Coagulation Profile: Recent Labs  Lab 07/23/21 1515  INR 1.1   Thyroid Function Tests: Recent Labs    07/23/21 1322  TSH 1.765   Lipid Profile: Recent Labs    07/24/21 0515  CHOL 124  HDL  34*  LDLCALC 71  TRIG 95  CHOLHDL 3.6   Anemia Panel: No results for input(s): VITAMINB12, FOLATE, FERRITIN, TIBC, IRON, RETICCTPCT in the last 72 hours. Urine analysis:    Component Value Date/Time   COLORURINE YELLOW 07/24/2021 [place]  07/24/2021 0928   LABSPEC 1.010 07/24/2021 0928   PHURINE 6.0 07/24/2021 0928   GLUCOSEU NEGATIVE 07/24/2021 0928   HGBUR NEGATIVE 07/24/2021 0928   HGBUR negative 10/04/2009 1044   BILIRUBINUR NEGATIVE 07/24/2021 0928   BILIRUBINUR negative 12/13/2017 1508   KETONESUR NEGATIVE 07/24/2021 0928   PROTEINUR 100 (A) 07/24/2021 0928   UROBILINOGEN 0.2 12/13/2017 1508   UROBILINOGEN 0.2 03/25/2012 1354   NITRITE NEGATIVE 07/24/2021 0928   LEUKOCYTESUR NEGATIVE 07/24/2021 0928   Sepsis Labs: Invalid input(s): PROCALCITONIN, [place]  Microbiology: Recent Results (from the past 240 hour(s))  Resp Panel by RT-PCR (Flu A&B, Covid) Nasopharyngeal Swab     Status: None   Collection Time: 07/23/21 11:12 AM   Specimen: Nasopharyngeal Swab; Nasopharyngeal(NP) swabs in vial transport medium  Result Value Ref Range Status   SARS Coronavirus 2 by RT PCR NEGATIVE NEGATIVE Final    Comment: (NOTE) SARS-CoV-2 target nucleic acids are NOT DETECTED.  The SARS-CoV-2 RNA is generally detectable in upper respiratory specimens during the acute phase of infection. The lowest concentration of SARS-CoV-2 viral copies this assay can detect is 138 copies/mL. A negative result does not preclude SARS-Cov-2 infection and should not be used as the sole basis for treatment or other patient management decisions. A negative result may occur with  improper specimen collection/handling, submission of specimen other than nasopharyngeal swab, presence of viral mutation(s) within the areas targeted by this assay, and inadequate number of viral copies(<138 copies/mL). A negative result must be combined with clinical observations, patient history, and  epidemiological information. The expected result is Negative.  Fact Sheet for Patients:  EntrepreneurPulse.com.au  Fact Sheet for Healthcare Providers:  IncredibleEmployment.be  This test is no t yet approved or cleared by the [place] FDA and  has been authorized for detection and/or diagnosis of SARS-CoV-2 by FDA under an Emergency Use Authorization (EUA). This EUA will remain  in effect (meaning this test can be used) for the duration of the COVID-19 declaration under Section 564(b)(1) of the Act, 21 U.S.C.section 360bbb-3(b)(1), unless the authorization is terminated  or revoked sooner.       Influenza A by PCR NEGATIVE NEGATIVE Final   Influenza B by PCR NEGATIVE NEGATIVE Final    Comment: (NOTE) The Xpert Xpress SARS-CoV-2/FLU/RSV plus assay is intended as an aid in the diagnosis of influenza from Nasopharyngeal swab specimens and should not be used as a sole basis for treatment. Nasal washings and aspirates are unacceptable for Xpert Xpress SARS-CoV-2/FLU/RSV testing.  Fact Sheet for Patients: EntrepreneurPulse.com.au  Fact Sheet for Healthcare Providers: IncredibleEmployment.be  This test is not yet approved or cleared by the [place] FDA and has been authorized for detection and/or diagnosis of SARS-CoV-2 by FDA under an Emergency Use Authorization (EUA). This EUA will remain in effect (meaning this test can be used) for the duration of the COVID-19 declaration under Section 564(b)(1) of the Act, 21 U.S.C. section 360bbb-3(b)(1), unless the authorization is terminated or revoked.  Performed at [facility], [place] [address] [place],  [address]     Radiology Studies: No results found.     Beatrice Ziehm T. [place]  If 7PM-7AM, please contact night-coverage [email or web address] 07/24/2021, 1:31 PM

## 2021-07-24 NOTE — Evaluation (Signed)
Occupational Therapy Evaluation Patient Details Name: [name] MRN: [id number] DOB: [birth year] Today's Date: 07/24/2021   History of Present Illness 76 yo female presenting to ED on 1/8 with R lleg weakness. CT showing small vessel disease in the left thalamus and possibly also pons. Awaiting MRI. Recent admission for GI bleed on 07/07/21-07/13/21. PMH including ESRD s/p renal transplant 2017 on chronic immunosuppression, Afib, PPM COVID 8/27-8/29 admission, PE / DVT/ IVC complicated by [place] [id number], diverticulosis 11/2019, CVA (2009), and depression.   Clinical Impression   PTA, pt was living with her husband and recently requiring assistance for ADLs and mobility with RW. Pt currently requiring Mod A for UB ADLs, Max A for LB ADLs, and Min A +2 for short distance mobility. Pt presenting with right sided weakness, poor cognition, decreased balance, and limited activity tolerance. Husband present throughout and verbalizing concern with her change in function. Pt would benefit from further acute OT to facilitate safe dc. Recommend dc to SNF for further OT to optimize safety, independence with ADLs, and return to PLOF.      Recommendations for follow up therapy are one component of a multi-disciplinary discharge planning process, led by the attending physician.  Recommendations may be updated based on patient status, additional functional criteria and insurance authorization.   Follow Up Recommendations  Skilled nursing-short term rehab (<3 hours/day)    Assistance Recommended at Discharge Frequent or constant Supervision/Assistance  Patient can return home with the following A lot of help with walking and/or transfers;A lot of help with bathing/dressing/bathroom    Functional Status Assessment  Patient has had a recent decline in their functional status and demonstrates the ability to make significant improvements in function in a reasonable and predictable amount of time.  Equipment  Recommendations  None recommended by OT    Recommendations for Other Services PT consult     Precautions / Restrictions Precautions Precautions: Fall Precaution Comments: incontinent      Mobility Bed Mobility Overal bed mobility: Needs Assistance Bed Mobility: Supine to Sit;Sit to Supine     Supine to sit: Mod assist;+2 for physical assistance Sit to supine: Mod assist;+2 for physical assistance   General bed mobility comments: Required assist for trunk and LEs    Transfers Overall transfer level: Needs assistance Equipment used: 2 person hand held assist Transfers: Sit to/from Stand Sit to Stand: Min assist;+2 physical assistance;+2 safety/equipment           General transfer comment: Min A +2 to stand for lift assist and steadying. Pt required seated rest after short bout of standing. On second attempt, pt able to take side steps with min A +2 but fatigued easily and required seated rest.      Balance Overall balance assessment: Needs assistance Sitting-balance support: No upper extremity supported;Feet supported Sitting balance-Leahy Scale: Fair     Standing balance support: Bilateral upper extremity supported Standing balance-Leahy Scale: Poor Standing balance comment: Reliant on BUE and external support                           ADL either performed or assessed with clinical judgement   ADL Overall ADL's : Needs assistance/impaired Eating/Feeding: NPO   Grooming: Minimal assistance;Sitting   Upper Body Bathing: Moderate assistance;Sitting   Lower Body Bathing: Maximal assistance;Sit to/from stand   Upper Body Dressing : Moderate assistance;Sitting   Lower Body Dressing: Maximal assistance;Sit to/from stand   Toilet Transfer: +2 for physical assistance;+2 for  safety/equipment;Minimal assistance;Cueing for safety (simulated in room)           Functional mobility during ADLs: Minimal assistance;+2 for physical assistance General ADL  Comments: Pt presenting with R sided weakness, poor vision, decreased cognition, nad poor strength and balance     Vision   Vision Assessment?: Yes;Vision impaired- to be further tested in functional context Eye Alignment: Impaired (comment) Ocular Range of Motion: Within Functional Limits Alignment/Gaze Preference: Within Defined Limits Tracking/Visual Pursuits: Decreased smoothness of vertical tracking;Decreased smoothness of horizontal tracking Convergence: Impaired (comment) Diplopia Assessment: Other (comment) (Reports moments of diplopia) Additional Comments: Noting dysconjugate gaze and poor tracking. Reporting momement of diplopia. Also moving head while tracking objects. Will continue to assess     Perception     Praxis      Pertinent Vitals/Pain Pain Assessment: No/denies pain     Hand Dominance Right   Extremity/Trunk Assessment Upper Extremity Assessment Upper Extremity Assessment: RUE deficits/detail RUE Deficits / Details: Decreased strength compared to left. Poor finger opposition - able to perform but very uncoordinated and repeating digits. RUE Coordination: decreased fine motor;decreased gross motor   Lower Extremity Assessment Lower Extremity Assessment: Defer to PT evaluation RLE Deficits / Details: Grossly 3/5 throughout. Increased swelling noted as well.   Cervical / Trunk Assessment Cervical / Trunk Assessment: Kyphotic   Communication Communication Communication: No difficulties   Cognition Arousal/Alertness: Awake/alert Behavior During Therapy: WFL for tasks assessed/performed Overall Cognitive Status: Impaired/Different from baseline Area of Impairment: Orientation;Problem solving;Awareness;Safety/judgement;Following commands;Memory;Attention                 Orientation Level: Disoriented to;Time Current Attention Level: Selective Memory: Decreased short-term memory Following Commands: Follows one step commands consistently;Follows one  step commands with increased time Safety/Judgement: Decreased awareness of safety;Decreased awareness of deficits Awareness: Emergent Problem Solving: Slow processing;Decreased initiation General Comments: Reports it was 1919 and then 1974. Cues for sequencing. Required encouragement to participate     General Comments  Pt's husband present and very concerned about pt returning home given current state.    Exercises     Shoulder Instructions      Home Living Family/patient expects to be discharged to:: Private residence Living Arrangements: Spouse/significant other Available Help at Discharge: Family;Available 24 hours/day Type of Home: House Home Access: Ramped entrance;Stairs to enter (ramp in front) Entrance Stairs-Number of Steps: 5in the back, 3 in the front Entrance Stairs-Rails: Left Home Layout: One level   Alternate Level Stairs-Rails: Right Bathroom Shower/Tub: Tub/shower unit;Walk-in shower   Bathroom Toilet: Handicapped height     Home Equipment: Transport planner;Wheelchair - Banker;Adaptive equipment;Hand held shower head;Tub bench;BSC/3in1;Cane - single point;Rolling Walker (2 wheels);Grab bars - tub/shower Adaptive Equipment: Reacher;Sock aid;Long-handled sponge    Lives With: Spouse    Prior Functioning/Environment Prior Level of Function : Needs assist       Physical Assist : ADLs (physical);Mobility (physical) Mobility (physical): Gait (Active with home health PT for gait training. Pt's husband provides supervision.) ADLs (physical): IADLs;Bathing;Dressing;Toileting Mobility Comments: Uses RW for ambulation. Has reported increased difficulty with ambulation ADLs Comments: Has needed help with transfers and bathing/dressing.        OT Problem List: Decreased strength;Cardiopulmonary status limiting activity;Decreased activity tolerance;Decreased knowledge of use of DME or AE;Impaired balance (sitting and/or standing)      OT  Treatment/Interventions: Self-care/ADL training;Therapeutic exercise;Therapeutic activities;Energy conservation;Patient/family education;DME and/or AE instruction;Balance training    OT Goals(Current goals can be found in the care plan section) Acute Rehab OT Goals Patient Stated  Goal: "Go to rehab to get stronger" husband OT Goal Formulation: With patient/family Time For Goal Achievement: 08/07/21 Potential to Achieve Goals: Good  OT Frequency: Min 2X/week    Co-evaluation PT/OT/SLP Co-Evaluation/Treatment: Yes Reason for Co-Treatment: Complexity of the patient's impairments (multi-system involvement);For patient/therapist safety;To address functional/ADL transfers   OT goals addressed during session: ADL's and self-care      AM-PAC OT "6 Clicks" Daily Activity     Outcome Measure Help from another person eating meals?: None Help from another person taking care of personal grooming?: A Little Help from another person toileting, which includes using toliet, bedpan, or urinal?: A Lot Help from another person bathing (including washing, rinsing, drying)?: A Lot Help from another person to put on and taking off regular upper body clothing?: A Little Help from another person to put on and taking off regular lower body clothing?: A Lot 6 Click Score: 16   End of Session Nurse Communication: Mobility status  Activity Tolerance: Patient tolerated treatment well Patient left: in bed;with call bell/phone within reach;with bed alarm set;with family/visitor present  OT Visit Diagnosis: Unsteadiness on feet (R26.81);Muscle weakness (generalized) (M62.81);History of falling (Z91.81)                Time: 1005-1027 OT Time Calculation (min): 22 min Charges:  OT General Charges $OT Visit: 1 Visit OT Evaluation $OT Eval Moderate Complexity: [place], OTR/L Acute Rehab Pager: [phone] Office: [place] 07/24/2021, 2:07 PM

## 2021-07-24 NOTE — ED Notes (Signed)
Patient transported to MRI with SWAT Juliann Pulse, rn

## 2021-07-24 NOTE — ED Notes (Signed)
Echo at bedside

## 2021-07-25 ENCOUNTER — Encounter (HOSPITAL_COMMUNITY): Payer: Self-pay | Admitting: Family Medicine

## 2021-07-25 ENCOUNTER — Ambulatory Visit: Payer: Medicare Other

## 2021-07-25 DIAGNOSIS — Y830 Surgical operation with transplant of whole organ as the cause of abnormal reaction of the patient, or of later complication, without mention of misadventure at the time of the procedure: Secondary | ICD-10-CM | POA: Diagnosis present

## 2021-07-25 DIAGNOSIS — R29705 NIHSS score 5: Secondary | ICD-10-CM | POA: Diagnosis present

## 2021-07-25 DIAGNOSIS — Z5181 Encounter for therapeutic drug level monitoring: Secondary | ICD-10-CM | POA: Diagnosis not present

## 2021-07-25 DIAGNOSIS — K2210 Ulcer of esophagus without bleeding: Secondary | ICD-10-CM | POA: Diagnosis present

## 2021-07-25 DIAGNOSIS — N179 Acute kidney failure, unspecified: Secondary | ICD-10-CM | POA: Diagnosis present

## 2021-07-25 DIAGNOSIS — Z940 Kidney transplant status: Secondary | ICD-10-CM | POA: Diagnosis not present

## 2021-07-25 DIAGNOSIS — I639 Cerebral infarction, unspecified: Secondary | ICD-10-CM | POA: Diagnosis not present

## 2021-07-25 DIAGNOSIS — M2550 Pain in unspecified joint: Secondary | ICD-10-CM | POA: Diagnosis not present

## 2021-07-25 DIAGNOSIS — J4540 Moderate persistent asthma, uncomplicated: Secondary | ICD-10-CM | POA: Diagnosis not present

## 2021-07-25 DIAGNOSIS — G8191 Hemiplegia, unspecified affecting right dominant side: Secondary | ICD-10-CM | POA: Diagnosis present

## 2021-07-25 DIAGNOSIS — Z7401 Bed confinement status: Secondary | ICD-10-CM | POA: Diagnosis not present

## 2021-07-25 DIAGNOSIS — Z7901 Long term (current) use of anticoagulants: Secondary | ICD-10-CM | POA: Diagnosis not present

## 2021-07-25 DIAGNOSIS — Z950 Presence of cardiac pacemaker: Secondary | ICD-10-CM | POA: Diagnosis not present

## 2021-07-25 DIAGNOSIS — Z95828 Presence of other vascular implants and grafts: Secondary | ICD-10-CM | POA: Diagnosis not present

## 2021-07-25 DIAGNOSIS — I6340 Cerebral infarction due to embolism of unspecified cerebral artery: Secondary | ICD-10-CM | POA: Diagnosis present

## 2021-07-25 DIAGNOSIS — K269 Duodenal ulcer, unspecified as acute or chronic, without hemorrhage or perforation: Secondary | ICD-10-CM | POA: Diagnosis present

## 2021-07-25 DIAGNOSIS — I120 Hypertensive chronic kidney disease with stage 5 chronic kidney disease or end stage renal disease: Secondary | ICD-10-CM | POA: Diagnosis not present

## 2021-07-25 DIAGNOSIS — R600 Localized edema: Secondary | ICD-10-CM | POA: Diagnosis not present

## 2021-07-25 DIAGNOSIS — G459 Transient cerebral ischemic attack, unspecified: Secondary | ICD-10-CM | POA: Diagnosis not present

## 2021-07-25 DIAGNOSIS — I5042 Chronic combined systolic (congestive) and diastolic (congestive) heart failure: Secondary | ICD-10-CM | POA: Diagnosis present

## 2021-07-25 DIAGNOSIS — M6281 Muscle weakness (generalized): Secondary | ICD-10-CM | POA: Diagnosis not present

## 2021-07-25 DIAGNOSIS — I482 Chronic atrial fibrillation, unspecified: Secondary | ICD-10-CM | POA: Diagnosis not present

## 2021-07-25 DIAGNOSIS — I10 Essential (primary) hypertension: Secondary | ICD-10-CM | POA: Diagnosis not present

## 2021-07-25 DIAGNOSIS — R5381 Other malaise: Secondary | ICD-10-CM | POA: Diagnosis not present

## 2021-07-25 DIAGNOSIS — R2689 Other abnormalities of gait and mobility: Secondary | ICD-10-CM | POA: Diagnosis not present

## 2021-07-25 DIAGNOSIS — L89152 Pressure ulcer of sacral region, stage 2: Secondary | ICD-10-CM | POA: Diagnosis not present

## 2021-07-25 DIAGNOSIS — N186 End stage renal disease: Secondary | ICD-10-CM | POA: Diagnosis not present

## 2021-07-25 DIAGNOSIS — I420 Dilated cardiomyopathy: Secondary | ICD-10-CM | POA: Diagnosis present

## 2021-07-25 DIAGNOSIS — R531 Weakness: Secondary | ICD-10-CM | POA: Diagnosis not present

## 2021-07-25 DIAGNOSIS — I480 Paroxysmal atrial fibrillation: Secondary | ICD-10-CM | POA: Diagnosis not present

## 2021-07-25 DIAGNOSIS — Z86718 Personal history of other venous thrombosis and embolism: Secondary | ICD-10-CM | POA: Diagnosis not present

## 2021-07-25 DIAGNOSIS — I69828 Other speech and language deficits following other cerebrovascular disease: Secondary | ICD-10-CM | POA: Diagnosis not present

## 2021-07-25 DIAGNOSIS — J45909 Unspecified asthma, uncomplicated: Secondary | ICD-10-CM | POA: Diagnosis present

## 2021-07-25 DIAGNOSIS — Z20822 Contact with and (suspected) exposure to covid-19: Secondary | ICD-10-CM | POA: Diagnosis present

## 2021-07-25 DIAGNOSIS — I4819 Other persistent atrial fibrillation: Secondary | ICD-10-CM | POA: Diagnosis present

## 2021-07-25 DIAGNOSIS — F32A Depression, unspecified: Secondary | ICD-10-CM | POA: Diagnosis present

## 2021-07-25 DIAGNOSIS — I132 Hypertensive heart and chronic kidney disease with heart failure and with stage 5 chronic kidney disease, or end stage renal disease: Secondary | ICD-10-CM | POA: Diagnosis present

## 2021-07-25 DIAGNOSIS — K1230 Oral mucositis (ulcerative), unspecified: Secondary | ICD-10-CM | POA: Diagnosis not present

## 2021-07-25 DIAGNOSIS — R4701 Aphasia: Secondary | ICD-10-CM | POA: Diagnosis present

## 2021-07-25 DIAGNOSIS — N185 Chronic kidney disease, stage 5: Secondary | ICD-10-CM | POA: Diagnosis present

## 2021-07-25 DIAGNOSIS — I5032 Chronic diastolic (congestive) heart failure: Secondary | ICD-10-CM | POA: Diagnosis not present

## 2021-07-25 DIAGNOSIS — Z79899 Other long term (current) drug therapy: Secondary | ICD-10-CM | POA: Diagnosis not present

## 2021-07-25 DIAGNOSIS — I69328 Other speech and language deficits following cerebral infarction: Secondary | ICD-10-CM | POA: Diagnosis not present

## 2021-07-25 DIAGNOSIS — G4733 Obstructive sleep apnea (adult) (pediatric): Secondary | ICD-10-CM | POA: Diagnosis present

## 2021-07-25 DIAGNOSIS — I6930 Unspecified sequelae of cerebral infarction: Secondary | ICD-10-CM | POA: Diagnosis not present

## 2021-07-25 DIAGNOSIS — Z86711 Personal history of pulmonary embolism: Secondary | ICD-10-CM | POA: Diagnosis not present

## 2021-07-25 DIAGNOSIS — Z9181 History of falling: Secondary | ICD-10-CM | POA: Diagnosis not present

## 2021-07-25 DIAGNOSIS — R29898 Other symptoms and signs involving the musculoskeletal system: Secondary | ICD-10-CM | POA: Diagnosis present

## 2021-07-25 DIAGNOSIS — T8619 Other complication of kidney transplant: Secondary | ICD-10-CM | POA: Diagnosis present

## 2021-07-25 DIAGNOSIS — R41841 Cognitive communication deficit: Secondary | ICD-10-CM | POA: Diagnosis not present

## 2021-07-25 DIAGNOSIS — I69351 Hemiplegia and hemiparesis following cerebral infarction affecting right dominant side: Secondary | ICD-10-CM | POA: Diagnosis not present

## 2021-07-25 DIAGNOSIS — Z91199 Patient's noncompliance with other medical treatment and regimen due to unspecified reason: Secondary | ICD-10-CM | POA: Diagnosis not present

## 2021-07-25 DIAGNOSIS — R471 Dysarthria and anarthria: Secondary | ICD-10-CM | POA: Diagnosis present

## 2021-07-25 LAB — CBC
HCT: 31.2 % — ABNORMAL LOW (ref 36.0–46.0)
Hemoglobin: 9.3 g/dL — ABNORMAL LOW (ref 12.0–15.0)
MCH: 26.2 pg (ref 26.0–34.0)
MCHC: 29.8 g/dL — ABNORMAL LOW (ref 30.0–36.0)
MCV: 87.9 fL (ref 80.0–100.0)
Platelets: 226 10*3/uL (ref 150–400)
RBC: 3.55 MIL/uL — ABNORMAL LOW (ref 3.87–5.11)
RDW: 20.3 % — ABNORMAL HIGH (ref 11.5–15.5)
WBC: 6.7 10*3/uL (ref 4.0–10.5)
nRBC: 0.3 % — ABNORMAL HIGH (ref 0.0–0.2)

## 2021-07-25 LAB — IRON AND TIBC
Iron: 94 ug/dL (ref 28–170)
Saturation Ratios: 59 % — ABNORMAL HIGH (ref 10.4–31.8)
TIBC: 160 ug/dL — ABNORMAL LOW (ref 250–450)
UIBC: 66 ug/dL

## 2021-07-25 LAB — RETICULOCYTES
Immature Retic Fract: 23.3 % — ABNORMAL HIGH (ref 2.3–15.9)
RBC.: 3.56 MIL/uL — ABNORMAL LOW (ref 3.87–5.11)
Retic Count, Absolute: 143.5 10*3/uL (ref 19.0–186.0)
Retic Ct Pct: 4 % — ABNORMAL HIGH (ref 0.4–3.1)

## 2021-07-25 LAB — VITAMIN B12: Vitamin B-12: 1274 pg/mL — ABNORMAL HIGH (ref 180–914)

## 2021-07-25 LAB — RESP PANEL BY RT-PCR (FLU A&B, COVID) ARPGX2
Influenza A by PCR: NEGATIVE
Influenza B by PCR: NEGATIVE
SARS Coronavirus 2 by RT PCR: NEGATIVE

## 2021-07-25 LAB — MAGNESIUM: Magnesium: 1.7 mg/dL (ref 1.7–2.4)

## 2021-07-25 LAB — FOLATE: Folate: 4.2 ng/mL — ABNORMAL LOW (ref >5.9)

## 2021-07-25 LAB — RENAL FUNCTION PANEL
Albumin: 2.3 g/dL — ABNORMAL LOW (ref 3.5–5.0)
Anion gap: 6 (ref 5–15)
BUN: 31 mg/dL — ABNORMAL HIGH (ref 8–23)
CO2: 22 mmol/L (ref 22–32)
Calcium: 8.3 mg/dL — ABNORMAL LOW (ref 8.9–10.3)
Chloride: 112 mmol/L — ABNORMAL HIGH (ref 98–111)
Creatinine, Ser: 3.12 mg/dL — ABNORMAL HIGH (ref 0.44–1.00)
GFR, Estimated: 15 mL/min — ABNORMAL LOW (ref >60)
Glucose, Bld: 98 mg/dL (ref 70–99)
Phosphorus: 2.6 mg/dL (ref 2.5–4.6)
Potassium: 4.1 mmol/L (ref 3.5–5.1)
Sodium: 140 mmol/L (ref 135–145)

## 2021-07-25 LAB — FERRITIN: Ferritin: 360 ng/mL — ABNORMAL HIGH (ref 11–307)

## 2021-07-25 MED ORDER — APIXABAN 2.5 MG PO TABS
2.5000 mg | ORAL_TABLET | Freq: Two times a day (BID) | ORAL | Status: DC
  Administered 2021-07-25 – 2021-07-26 (×3): 2.5 mg via ORAL
  Filled 2021-07-25 (×3): qty 1

## 2021-07-25 MED ORDER — MAGNESIUM SULFATE 2 GM/50ML IV SOLN
2.0000 g | Freq: Once | INTRAVENOUS | Status: AC
  Administered 2021-07-25: 2 g via INTRAVENOUS
  Filled 2021-07-25: qty 50

## 2021-07-25 NOTE — Progress Notes (Addendum)
STROKE TEAM PROGRESS NOTE   ATTENDING NOTE: I reviewed above note and agree with the assessment and plan. Pt was seen and examined.   Brother, husband are at bedside.  Patient lying in bed, awake alert, pleasant, smiling to providers, neuro stable unchanged.  Cardiology Dr. Johnsie Cancel on board, agree with Eliquis.  Discussed with Dr. Cyndia Skeeters and seems GI cleared her for low-dose Eliquis.  Based on creatinine and low body weight, agree with Eliquis 2.5 mg twice daily. Currently hemoglobin stable 9.3, will need close monitoring.  Continue low-dose Lipitor 20.  PT/OT recommend SNF.  For detailed assessment and plan, please refer to above as I have made changes wherever appropriate.    Neurology will sign off. Please call with questions. Pt will follow up with stroke clinic NP at [facility] in about 4 weeks. Thanks for the consult.   Jane Hawking, MD PhD Stroke Neurology 07/25/2021 8:54 PM    INTERVAL HISTORY Her brother is at bedside. Patient reports continued RUE weakness but this has been improving. Patient denies any other emergent complaints today.    Discussed MRI results with patient that indicated acute infarct in L thalamus and punctate acute infarcts in frontal lobes bilaterally. Discussed that we would recommend starting low dose Eliquis to prevent further stroke preventions even with a small risk of a bleeding event given patient's most recent hospitalization for GI bleeding. Patient agreeable to plan and all questions were answered.   Vitals:   07/25/21 0551 07/25/21 0733 07/25/21 0815 07/25/21 1114  BP: 128/75 (!) 144/78  137/73  Pulse: 72 70  71  Resp: 17 15  16   Temp: (!) 97.5 F (36.4 C) (!) 97.2 F (36.2 C)  97.7 F (36.5 C)  TempSrc:  Oral  Oral  SpO2: 100% 100%  100%  Weight:   55.9 kg   Height:   5' 5.5" (1.664 m)    CBC:  Recent Labs  Lab 07/21/21 1201 07/23/21 1043 07/23/21 1108 07/24/21 0515 07/25/21 0620  WBC 6.4 8.2  --  7.0 6.7  NEUTROABS 5.0 6.6  --   --   --    HGB 9.1* 9.5*   < > 8.9* 9.3*  HCT 28.9* 32.0*   < > 29.3* 31.2*  MCV 85.1 89.4  --  88.3 87.9  PLT 222.0 237  --  227 226   < > = values in this interval not displayed.    Basic Metabolic Panel:  Recent Labs  Lab 07/21/21 1201 07/23/21 1043 07/24/21 0515 07/25/21 0620  NA 140   < > 139 140  K 3.1*   < > 2.8* 4.1  CL 109   < > 110 112*  CO2 22   < > 20* 22  GLUCOSE 105*   < > 92 98  BUN 37*   < > 31* 31*  CREATININE 3.25*   < > 3.18* 3.12*  CALCIUM 8.4   < > 8.4* 8.3*  MG 1.5  --   --  1.7  PHOS  --   --   --  2.6   < > = values in this interval not displayed.    Lipid Panel:  Recent Labs  Lab 07/24/21 0515  CHOL 124  TRIG 95  HDL 34*  CHOLHDL 3.6  VLDL 19  LDLCALC 71    HgbA1c:  Recent Labs  Lab 07/23/21 1400  HGBA1C 4.7*    Urine Drug Screen: No results for input(s): LABOPIA, COCAINSCRNUR, LABBENZ, AMPHETMU, THCU, LABBARB in the last  168 hours.  Alcohol Level No results for input(s): ETH in the last 168 hours.  IMAGING past 24 hours No results found.  PHYSICAL EXAM  Temp:  [97.2 F (36.2 C)-97.7 F (36.5 C)] 97.7 F (36.5 C) (01/10 1114) Pulse Rate:  [70-73] 71 (01/10 1114) Resp:  [15-27] 16 (01/10 1114) BP: (124-146)/(73-100) 137/73 (01/10 1114) SpO2:  [100 %] 100 % (01/10 1114) Weight:  [55.9 kg] 55.9 kg (01/10 0815)  General - Well nourished, well developed, in no apparent distress.  Cardiovascular - Regular rhythm and rate.  Mental Status -  Level of arousal and orientation to place and person were intact. Patient believes it is 60 Language including naming, repetition was assessed and found intact. Some deficits in comprehension and dysphagia present Attention span and concentration were normal. Recent and remote memory were relatively intact. Does not recall neurologic deficits associated with stroke. Fund of Knowledge was assessed and was intact.  Cranial Nerves II - XII - II - Visual field intact. III, IV, VI - Extraocular  movements intact. V - Facial sensation intact bitlaterally. VII - Right facial droop, forehead included. VIII - Hearing & vestibular intact bilaterally. X - Palate elevates symmetrically. XI - Chin turning & shoulder shrug intact bilaterally. XII - Tongue protrusion intact.  Motor Strength - RUE pronator drift still present. RUE weakness. RLE strength has improved since yesterday. Bulk was normal and fasciculations were absent.   Motor Tone - Muscle tone was assessed at the neck and appendages and was normal.  Reflexes - The patients reflexes were symmetrical in all extremities and she had no pathological reflexes.  Sensory - Light touch, temperature/pinprick were assessed and were symmetrical.    Coordination - The patient had normal movements in the hands and feet with no ataxia or dysmetria.  Tremor was absent.  Gait and Station - deferred.  ASSESSMENT/PLAN [name] is a 76 y.o. female with history of CKD-5, ICH, Afib s/p ablation and LAA closure not on AC, AVB s/p PPM, DVT/PE s/p IVC filter, duodenal ulcer, diastolic CHF, asthma, OSA, depression and recent hospitalziation for BLA due to GI bleed presenting with RLE weakness, slurred speech, and confusion.   Stroke:  Left thalamus, b/l frontal small infarcts likely secondary due to Afib not on [facility] Code Stroke CT head No acute abnormality. Advanced chronic ischemic disease as well as small vessel disease in L thalamus and pons. ASPECTS 10.    MRI  acute infarct in L thalamus, punctate aute infarcts in bilateral frontal lobes consistent with cardioembolic etiology, moderate chronic micorvascular ischemic disease MRA head and neck no LVO or high grade stenosis 2D Echo EF 35-40% Lower Extremity US: no evidence of DVT bilaterally LDL 71 HgbA1c 4.7 VTE prophylaxis - SCDs No antithrombotic prior to admission, now on Eliquis 2.5 mg twice daily (Cre and weight) Therapy recommendations:  SNF Disposition:  pending  AF not on  AC s/p ablation/PPM implant, s/p watchman device 2019 Was on ASA 81 PTA Recommend low dose Eliquis 2.5 mg bid if cleared by GI  GIB Admitted 06/2021 Hb 5.8 needed PRBC 4 units EGD revealed esophageal ulcer, duodenal ulcer,  grade D LA erosive esophagitis and nonbleeding AVM treated with APC.  Flexible sigmoidoscopy showed blood in rectum, rectosigmoid colon and sigmoid colon with internal and external hemorrhoids.  Video capsule endoscopy normal. 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  No need of high intensity statin given LDL near goal and history of ICH. Continue statin on discharge  Other Stroke Risk Factors Advanced Age >/= 91  Obstructive sleep apnea, not on CPAP at home Congestive heart failure DVT/PE s/p IVC filter 2013  Other Active Problems Erosive Esophagitis: protonix, carafate CKD stage V s/p renal transplant - avoid ordering contrast based imaging Depression: continue cymbalta Right leg edema: DVT doppler negative  Hospital day # 0  France Ravens, MD PGY1 Resident  To contact Stroke Continuity provider, please refer to [email or web address] After hours, contact General Neurology

## 2021-07-25 NOTE — Consult Note (Signed)
Consultation Note Date: 07/25/2021   Patient Name: [name]  DOB: 01-05-1946  MRN: [id number]  Age / Sex: 76 y.o., female  PCP: [name], [name], NP Referring Physician: [name], MD  Reason for Consultation: goals of care   HPI/Patient Profile: 76 y.o. female  with past medical history of HTN, depression, GI bleed, iron deficiency anemia, CKD 5, renal transplant, heart failure, a fib-s/p wathcman implant admitted on 07/23/2021 with CVA. She has been Manufacturing systems engineer PATIENT- with help from [facility]- spouse- Yvett Rossel  Discussion: I have reviewed medical records including EPIC notes, labs (CBC, renal function panel, CMP) and imaging (ECHO, MRI, CT) assessed the patient and then met with patient's spouse and daughter  to discuss diagnosis prognosis, GOC, disposition and options.  I introduced Palliative Medicine as specialized medical care for people living with serious illness. It focuses on providing relief from the symptoms and stress of a serious illness. The goal is to improve quality of life for both the patient and the family.  We discussed a brief life review of the patient. She and [name] have been married for 45 years. She is known for her kindness and generosity. She is very close with her children and grandchildren.     As far as functional and nutritional status- prior to this admission she was living at home and utilizing tools to enable her independence which is of high value to her.     We discussed patient's current illness and what it means in the larger context of patient's on-going co-morbidities.  Natural disease trajectories were discussed given her multiple chronic illnesses.   I attempted to elicit values and goals of care important to the patient. Being independent and being able to make her own decisions are very important to her. Spending time with her  family is also important.   Anticipated care decisions were discussed. Patient was asleep during some of our discussion. [name] shared that if it were up to him he would not want patient to undergo CPR, he understands it could cause her harm, and would not restore her to an independent state if she deteriorated to the point that she required CPR. However, patient has expressed her desire for CPR in the even of cardiac arrest and he wishes to honor that.   Discussed with patient/family the importance of continued conversation with family and the medical providers regarding overall plan of care and treatment options, ensuring decisions are within the context of the patients values and GOCs.    Palliative Care services outpatient were explained and offered.  A copy of the Hard Choices book was given to spouse.   Questions and concerns were addressed. The family was encouraged to call with questions or concerns.      SUMMARY OF RECOMMENDATIONS --Full scope, Full code --GOC are for patient to go to SNF for rehab in hopes of improving to the point that she can return home -[facility] referral for outpatient Palliative to see patient at [facility]  and at home after discharge   Code Status/Advance Care Planning: Full code   Prognosis:   Unable to determine  Discharge Planning: [place] for rehab with Palliative care service follow-up  Primary Diagnoses: Present on Admission:  Right leg weakness  Atrial fibrillation s/p AVN ablation/PPM implant, s/p LAAC with Watchman  Asthmatic bronchitis without complication  Hypertension  Chronic combined systolic and diastolic CHF (congestive heart failure) (HCC)  CKD (chronic kidney disease), stage V (HCC)  Obstructive sleep apnea  Depression  Erosive esophagitis/esophageal ulceration/duodenal ulceration  Acute blood loss anemia secondary to diverticular bleed   Edema of right lower leg  Acute CVA (cerebrovascular accident) ([place])   Review  of Systems  Physical Exam  Vital Signs: BP 137/73 (BP Location: Right Arm)    Pulse 71    Temp 97.7 F (36.5 C) (Oral)    Resp 16    Ht 5' 5.5" (1.664 m)    Wt 55.9 kg    SpO2 100%    BMI 20.19 kg/m  Pain Scale: 0-10   Pain Score: 0-No pain   SpO2: SpO2: 100 % O2 Device:SpO2: 100 % O2 Flow Rate: .   IO: Intake/output summary:  Intake/Output Summary (Last 24 hours) at 07/25/2021 1701 Last data filed at 07/25/2021 1200 Gross per 24 hour  Intake 420 ml  Output 250 ml  Net 170 ml    LBM: Last BM Date: 07/23/21 Baseline Weight: Weight: 55.9 kg Most recent weight: Weight: 55.9 kg     Palliative Assessment/Data: PPS: 50 %       Thank you for this consult. Palliative medicine will continue to follow and assist as needed.    Time Total: 105 minutes Greater than 50%  of this time was spent counseling and coordinating care related to the above assessment and plan.  Signed by: Mariana Kaufman, AGNP-C Palliative Medicine    Please contact Palliative Medicine Team phone at [phone] for questions and concerns.  For individual provider: See Shea Evans

## 2021-07-25 NOTE — Progress Notes (Signed)
PROGRESS NOTE  HOLDEN DRAUGHON VEH:[id number] DOB: 12/01/1945   PCP: [name], NP  Patient is from: Home.  Lives with husband.  DOA: 07/23/2021 LOS: 0  Chief complaints:  Chief Complaint  Patient presents with   Code Stroke     Brief Narrative / Interim history: 76 y.o. F with PMH of ESRD s/p renal transplant in 2017 now with CKD-5, ICH, A. Fib s/p ablation and LAA closure, AVB s/p PPM, DVT/PE s/p IVC filter, duodenal ulcer, diastolic CHF, asthma, OSA, depression and recent hospitalization for a BLA due to GI bleed with extensive work-up and blood transfusion.  She presented with acute onset of RLE weakness, slurred speech, dizziness and confusion, and admitted for suspected left-sided CVA.  CTH with age-indeterminate small vessel disease in left thalamus and possibly pounds and chronic right thalamic lacunar infarct.  Neurology consulted and recommended CVA work-up.  Patient is outside tPA window on arrival to ED.  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  Patient was not on anticoagulation due to GI bleed.  Neurology suggesting low-dose Eliquis if okay from GI standpoint.  Discussed with [facility], Dr. Benson Norway who is in agreement with low-dose Eliquis.  Eliquis resumed after risk and benefit discussion with family.   Therapy recommended SNF.  She can be transferred to SNF as early as 2022-08-01 if H&H stable.  Subjective: Seen and examined earlier this morning.  Patient's husband and daughter at bedside.  Another daughter over the phone.  No major events overnight or this morning.  Some improvement in facial droop and right-sided weakness.  She denies headache, vision change, chest pain, GI or UTI symptoms.  Extensive discussion about risk and benefit of Eliquis.   Objective: Vitals:   07/25/21 0551 07/25/21 0733 07/25/21 0815 07/25/21 1114  BP: 128/75 (!) 144/78  137/73   Pulse: 72 70  71  Resp: 17 15  16   Temp: (!) 97.5 F (36.4 C) (!) 97.2 F (36.2 C)  97.7 F (36.5 C)  TempSrc:  Oral  Oral  SpO2: 100% 100%  100%  Weight:   55.9 kg   Height:   5' 5.5" (1.664 m)     Examination:  GENERAL: No apparent distress.  Nontoxic. HEENT: MMM.  Vision and hearing grossly intact.  NECK: Supple.  No apparent JVD.  RESP:  No IWOB.  Fair aeration bilaterally. CVS:  RRR. Heart sounds normal.  ABD/GI/GU: BS+. Abd soft, NTND.  MSK/EXT:  Moves extremities. No apparent deformity. No edema.  SKIN: no apparent skin lesion or wound NEURO: Awake and alert.  Oriented to self, family, place and month but not day or year.  Some residual expressive aphasia.  Residual right-sided facial droop but improved.  Motor 3+/5 in RUE and RLE, and 4+/5 in left side.  Light sensation grossly intact.  Dysmetria on finger-to-nose on the right.  Pronator drift on the right. PSYCH: Calm. Normal affect.   Procedures:  None  Microbiology summarized: [id number] and influenza PCR nonreactive.  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  Patient with history of CVA with some residual right hemiparesis but worse on admission.  CTH, MRI brain and TTE as above.  Has history of DVT/PE with IVC filter and A. Fib.  Was off anticoagulation due to GI bleed.    A1c 4.7 but could be  falsely low given recent blood transfusion.  LDL 71.  Still with the above neuro deficit but improved. -Neurology recommended low-dose Eliquis.  GI and cardiology in agreement -Resumed Eliquis 2.5 mg twice daily after risk and benefit discussion with family.  H&H stable. -Check CBC in the morning -Follow PT/OT/SLP recommendations-recommended SNF.  Family in agreement.   History of acute blood loss anemia due to diverticular bleed-extensive work-up last months. Erosive esophagitis/esophageal ulceration/duodenal ulceration:  POA Recent Labs    07/12/21 0840 07/12/21 1616 07/13/21 0038 07/13/21 0758 07/18/21 1114 07/21/21 1201 07/23/21 1043 07/23/21 1108 07/24/21 0515 07/25/21 0620  HGB 8.6* 8.5* 8.0* 8.4* 9.2* 9.1* 9.5* 11.2* 8.9* 9.3*  -Protonix 40 mg twice daily for 1 month followed by 40 mg daily -Continue Carafate ACHS -Check CBC in the morning now we resumed Eliquis -f/u with GI outpatient    CKD-5/azotemia: Cr better than baseline.  Azotemia improved. Recent Labs    07/09/21 0546 07/10/21 0434 07/11/21 0433 07/12/21 0440 07/13/21 0038 07/21/21 1201 07/23/21 1043 07/23/21 1108 07/24/21 0515 07/25/21 0620  BUN 146* 123* 121* 91* 83* 37* 31* 42* 31* 31*  CREATININE 4.13* 4.21* 3.75* 3.26* 3.26* 3.25* 3.09* 3.10* 3.18* 3.12*  -Continue monitoring  History of renal transplant in 2017 -Continue home Prograf, prednisone and Myfortic.  Chronic combined CHF/DCM: TTE with LVEF of 35 to 40%, R WMA, indeterminate DD, severe TVR, RVSP of 61.3 mmHg.  Significant change from previous TTE in 10/2019, although she had an LVEF of 25 to 30%, moderate TVR and RVSP of 58 mmHg in 2019.  Appears euvolemic on exam.  No cardiopulmonary symptoms. Not a candidate for catheterization due to advanced CKD.  Not on diuretics at home. -Continue low-dose Coreg -CKD precludes ARNI/ARB/ACEI     Essential hypertension: Normotensive. -Continue Coreg.  Atrial fibrillation s/p AVN ablation/PPM implant, s/p LAAC with Watchman-POA -Continue telemetry -Back on Eliquis 2.5 mg twice daily   Asthmatic bronchitis without complication: Stable. -Stable, continue symbicort -Change as needed albuterol to Xopenex given risk for arrhythmia.  Depression: Stable. -Continue her cymbalta 20mg     Obstructive sleep apnea not compliant with CPAP -Declined CPAP here.   RLE edema: LE [name] negative for DVT.  Likely due to chronic DVT/IVC filter.  Hypokalemia: Resolved.  Mg 1.7. -IV magnesium 2 g x 1  Goal of care counseling:  Remains full code.  Extensive discussion on 1/9 -Continue full code -Palliative medicine consulted  Body mass index is 20.19 kg/m.        DVT prophylaxis:  apixaban (ELIQUIS) tablet 2.5 mg Start: 07/25/21 1130 Place TED hose Start: 07/23/21 1413 apixaban (ELIQUIS) tablet 2.5 mg  Code Status: Full code Family Communication: Updated patient's husband and daughter at the bedside and another daughter over the phone. Level of care: Telemetry Medical Status is: Observation  The patient will require care spanning > 2 midnights and should be moved to inpatient because: Acute CVA with right-sided deficit,  further evaluation for CVA and need for safe disposition, SNF   Final disposition: Likely SNF   Consultants:  Neurology Gastroenterology over the phone Cardiology   Sch Meds:  Scheduled Meds:  apixaban  2.5 mg Oral BID   atorvastatin  20 mg Oral Daily   carvedilol  6.25 mg Oral BID PC   DULoxetine  20 mg Oral Daily   fluticasone furoate-vilanterol  1 puff Inhalation Daily   mycophenolate  180 mg Oral BID   pantoprazole  40 mg Oral BID PC   predniSONE  5 mg  Oral Daily   sodium bicarbonate  650 mg Oral BID   sodium chloride flush  3 mL Intravenous Q12H   sucralfate  1 g Oral QID   tacrolimus  1 mg Oral BID   Continuous Infusions:  sodium chloride     PRN Meds:.sodium chloride, acetaminophen **OR** acetaminophen, levalbuterol, sodium chloride flush  Antimicrobials: Anti-infectives (From admission, onward)    None        I have personally reviewed the following labs and images: CBC: Recent Labs  Lab 07/21/21 1201 07/23/21 1043 07/23/21 1108 07/24/21 0515 07/25/21 0620  WBC 6.4 8.2  --  7.0 6.7  NEUTROABS 5.0 6.6  --   --   --   HGB 9.1* 9.5* 11.2* 8.9* 9.3*  HCT 28.9* 32.0* 33.0* 29.3* 31.2*  MCV 85.1 89.4  --  88.3 87.9  PLT 222.0 237  --  227 226   BMP &GFR Recent Labs  Lab 07/21/21 1201 07/23/21 1043 07/23/21 1108 07/24/21 0515 07/25/21 0620   NA 140 138 141 139 140  K 3.1* 4.5 3.7 2.8* 4.1  CL 109 110 111 110 112*  CO2 22 20*  --  20* 22  GLUCOSE 105* 101* 91 92 98  BUN 37* 31* 42* 31* 31*  CREATININE 3.25* 3.09* 3.10* 3.18* 3.12*  CALCIUM 8.4 8.2*  --  8.4* 8.3*  MG 1.5  --   --   --  1.7  PHOS  --   --   --   --  2.6   Estimated Creatinine Clearance: 14.2 mL/min (A) (by C-G formula based on SCr of 3.12 mg/dL (H)). Liver & Pancreas: Recent Labs  Lab 07/23/21 1043 07/25/21 0620  AST 47*  --   ALT 10  --   ALKPHOS 54  --   BILITOT 1.6*  --   PROT 4.6*  --   ALBUMIN 2.4* 2.3*   No results for input(s): LIPASE, AMYLASE in the last 168 hours. No results for input(s): AMMONIA in the last 168 hours. Diabetic: Recent Labs    07/23/21 1400  HGBA1C 4.7*   Recent Labs  Lab 07/23/21 1043  GLUCAP 78   Cardiac Enzymes: No results for input(s): CKTOTAL, CKMB, CKMBINDEX, TROPONINI in the last 168 hours. No results for input(s): PROBNP in the last 8760 hours. Coagulation Profile: Recent Labs  Lab 07/23/21 1515  INR 1.1   Thyroid Function Tests: Recent Labs    07/23/21 1322  TSH 1.765   Lipid Profile: Recent Labs    07/24/21 0515  CHOL 124  HDL 34*  LDLCALC 71  TRIG 95  CHOLHDL 3.6   Anemia Panel: Recent Labs    07/25/21 0620  VITAMINB12 1,274*  FOLATE 4.2*  FERRITIN 360*  TIBC 160*  IRON 94  RETICCTPCT 4.0*   Urine analysis:    Component Value Date/Time   COLORURINE YELLOW 07/24/2021 0928   APPEARANCEUR CLEAR 07/24/2021 0928   LABSPEC 1.010 07/24/2021 0928   PHURINE 6.0 07/24/2021 0928   GLUCOSEU NEGATIVE 07/24/2021 0928   HGBUR NEGATIVE 07/24/2021 0928   HGBUR negative 10/04/2009 1044   BILIRUBINUR NEGATIVE 07/24/2021 0928   BILIRUBINUR negative 12/13/2017 1508   KETONESUR NEGATIVE 07/24/2021 0928   PROTEINUR 100 (A) 07/24/2021 0928   UROBILINOGEN 0.2 12/13/2017 1508   UROBILINOGEN 0.2 03/25/2012 1354   NITRITE NEGATIVE 07/24/2021 0928   LEUKOCYTESUR NEGATIVE 07/24/2021 0928    Sepsis Labs: Invalid input(s): PROCALCITONIN, [place]  Microbiology: Recent Results (from the past 240 hour(s))  Resp Panel by RT-PCR (  Flu A&B, Covid) Nasopharyngeal Swab     Status: None   Collection Time: 07/23/21 11:12 AM   Specimen: Nasopharyngeal Swab; Nasopharyngeal(NP) swabs in vial transport medium  Result Value Ref Range Status   SARS Coronavirus 2 by RT PCR NEGATIVE NEGATIVE Final    Comment: (NOTE) SARS-CoV-2 target nucleic acids are NOT DETECTED.  The SARS-CoV-2 RNA is generally detectable in upper respiratory specimens during the acute phase of infection. The lowest concentration of SARS-CoV-2 viral copies this assay can detect is 138 copies/mL. A negative result does not preclude SARS-Cov-2 infection and should not be used as the sole basis for treatment or other patient management decisions. A negative result may occur with  improper specimen collection/handling, submission of specimen other than nasopharyngeal swab, presence of viral mutation(s) within the areas targeted by this assay, and inadequate number of viral copies(<138 copies/mL). A negative result must be combined with clinical observations, patient history, and epidemiological information. The expected result is Negative.  Fact Sheet for Patients:  EntrepreneurPulse.com.au  Fact Sheet for Healthcare Providers:  IncredibleEmployment.be  This test is no t yet approved or cleared by the [place] FDA and  has been authorized for detection and/or diagnosis of SARS-CoV-2 by FDA under an Emergency Use Authorization (EUA). This EUA will remain  in effect (meaning this test can be used) for the duration of the COVID-19 declaration under Section 564(b)(1) of the Act, 21 U.S.C.section 360bbb-3(b)(1), unless the authorization is terminated  or revoked sooner.       Influenza A by PCR NEGATIVE NEGATIVE Final   Influenza B by PCR NEGATIVE NEGATIVE Final     Comment: (NOTE) The Xpert Xpress SARS-CoV-2/FLU/RSV plus assay is intended as an aid in the diagnosis of influenza from Nasopharyngeal swab specimens and should not be used as a sole basis for treatment. Nasal washings and aspirates are unacceptable for Xpert Xpress SARS-CoV-2/FLU/RSV testing.  Fact Sheet for Patients: EntrepreneurPulse.com.au  Fact Sheet for Healthcare Providers: IncredibleEmployment.be  This test is not yet approved or cleared by the [place] FDA and has been authorized for detection and/or diagnosis of SARS-CoV-2 by FDA under an Emergency Use Authorization (EUA). This EUA will remain in effect (meaning this test can be used) for the duration of the COVID-19 declaration under Section 564(b)(1) of the Act, 21 U.S.C. section 360bbb-3(b)(1), unless the authorization is terminated or revoked.  Performed at [facility], [place] [address] [place], [place] [address]     Radiology Studies: MR ANGIO HEAD WO CONTRAST  Result Date: 07/24/2021 CLINICAL DATA:  Neuro deficit, acute, stroke suspected EXAM: MRI HEAD WITHOUT CONTRAST MRA HEAD WITHOUT CONTRAST MRA NECK WITHOUT CONTRAST TECHNIQUE: Multiplanar, multiecho pulse sequences of the brain and surrounding structures were obtained without intravenous contrast. Angiographic images of the Circle of Willis were obtained using MRA technique without intravenous contrast. Angiographic images of the neck were obtained using MRA technique without intravenous contrast. Carotid stenosis measurements (when applicable) are obtained utilizing NASCET criteria, using the distal internal carotid diameter as the denominator. COMPARISON:  CT head 07/23/2021. FINDINGS: MRI HEAD FINDINGS Brain: Acute infarct in the left thalamus. Punctate infarcts in the bilateral frontal lobes (series 4, image 35). Associated edema without mass effect. Additional patchy and confluent T2/FLAIR hyperintense signal within  the supratentorial and pontine white matter, nonspecific but compatible with moderate chronic microvascular ischemic disease. Encephalomalacia in the anterior left temporal lobe and high left perirolandic cortex. Evidence of prior hemorrhage in the periventricular right frontal lobe with susceptibility artifact and  T2 hypointensity. No evidence of acute hemorrhage. No hydrocephalus, mass lesion, midline shift, or extra-axial fluid collection Vascular: See below. Skull and upper cervical spine: Degenerative change at the craniocervical junction with pannus along the dorsal dens. No focal marrow replacing lesion. Sinuses/Orbits: Moderate mucosal thickening of the right maxillary sinus, anterior right ethmoid air cells, and right frontal sinus. Other: Moderate right mastoid effusion. MRA HEAD FINDINGS Anterior circulation: Bilateral intracranial ICAs, MCAs, and ACAs are patent without proximal high-grade stenosis. Posterior circulation: Bilateral vertebral arteries, basilar artery and posterior cerebral arteries are patent without proximal high-grade stenosis. MRA NECK FINDINGS Great vessel origins are not imaged. Nondiagnostic evaluation of the proximal arteries in the lower neck. Visible common carotid arteries and internal carotid arteries are patent without evidence of significant (greater than 50%) stenosis. Visible vertebral arteries are patent with out evidence of significant (greater than 50%) stenosis. The vertebral arteries are codominant. IMPRESSION: MRI: 1. Acute infarct in the left thalamus. Punctate acute infarcts in the frontal lobes bilaterally. Given involvement of multiple vascular territories, consider a central embolic etiology. 2. Encephalomalacia in the anterior left temporal lobe and high left perirolandic cortex. 3. Moderate chronic microvascular ischemic disease. 4. Prior hemorrhage in the periventricular right frontal lobe. MRA head: No large vessel occlusion or proximal high-grade stenosis.  MRA neck: No visible occlusion or significant stenosis with limitations described above. Electronically Signed   By: Margaretha Sheffield M.D.   On: 07/24/2021 15:16   MR ANGIO NECK WO CONTRAST  Result Date: 07/24/2021 CLINICAL DATA:  Neuro deficit, acute, stroke suspected EXAM: MRI HEAD WITHOUT CONTRAST MRA HEAD WITHOUT CONTRAST MRA NECK WITHOUT CONTRAST TECHNIQUE: Multiplanar, multiecho pulse sequences of the brain and surrounding structures were obtained without intravenous contrast. Angiographic images of the Circle of Willis were obtained using MRA technique without intravenous contrast. Angiographic images of the neck were obtained using MRA technique without intravenous contrast. Carotid stenosis measurements (when applicable) are obtained utilizing NASCET criteria, using the distal internal carotid diameter as the denominator. COMPARISON:  CT head 07/23/2021. FINDINGS: MRI HEAD FINDINGS Brain: Acute infarct in the left thalamus. Punctate infarcts in the bilateral frontal lobes (series 4, image 35). Associated edema without mass effect. Additional patchy and confluent T2/FLAIR hyperintense signal within the supratentorial and pontine white matter, nonspecific but compatible with moderate chronic microvascular ischemic disease. Encephalomalacia in the anterior left temporal lobe and high left perirolandic cortex. Evidence of prior hemorrhage in the periventricular right frontal lobe with susceptibility artifact and T2 hypointensity. No evidence of acute hemorrhage. No hydrocephalus, mass lesion, midline shift, or extra-axial fluid collection Vascular: See below. Skull and upper cervical spine: Degenerative change at the craniocervical junction with pannus along the dorsal dens. No focal marrow replacing lesion. Sinuses/Orbits: Moderate mucosal thickening of the right maxillary sinus, anterior right ethmoid air cells, and right frontal sinus. Other: Moderate right mastoid effusion. MRA HEAD FINDINGS Anterior  circulation: Bilateral intracranial ICAs, MCAs, and ACAs are patent without proximal high-grade stenosis. Posterior circulation: Bilateral vertebral arteries, basilar artery and posterior cerebral arteries are patent without proximal high-grade stenosis. MRA NECK FINDINGS Great vessel origins are not imaged. Nondiagnostic evaluation of the proximal arteries in the lower neck. Visible common carotid arteries and internal carotid arteries are patent without evidence of significant (greater than 50%) stenosis. Visible vertebral arteries are patent with out evidence of significant (greater than 50%) stenosis. The vertebral arteries are codominant. IMPRESSION: MRI: 1. Acute infarct in the left thalamus. Punctate acute infarcts in the frontal lobes bilaterally. Given involvement of  multiple vascular territories, consider a central embolic etiology. 2. Encephalomalacia in the anterior left temporal lobe and high left perirolandic cortex. 3. Moderate chronic microvascular ischemic disease. 4. Prior hemorrhage in the periventricular right frontal lobe. MRA head: No large vessel occlusion or proximal high-grade stenosis. MRA neck: No visible occlusion or significant stenosis with limitations described above. Electronically Signed   By: Margaretha Sheffield M.D.   On: 07/24/2021 15:16   MR BRAIN WO CONTRAST  Result Date: 07/24/2021 CLINICAL DATA:  Neuro deficit, acute, stroke suspected EXAM: MRI HEAD WITHOUT CONTRAST MRA HEAD WITHOUT CONTRAST MRA NECK WITHOUT CONTRAST TECHNIQUE: Multiplanar, multiecho pulse sequences of the brain and surrounding structures were obtained without intravenous contrast. Angiographic images of the Circle of Willis were obtained using MRA technique without intravenous contrast. Angiographic images of the neck were obtained using MRA technique without intravenous contrast. Carotid stenosis measurements (when applicable) are obtained utilizing NASCET criteria, using the distal internal carotid  diameter as the denominator. COMPARISON:  CT head 07/23/2021. FINDINGS: MRI HEAD FINDINGS Brain: Acute infarct in the left thalamus. Punctate infarcts in the bilateral frontal lobes (series 4, image 35). Associated edema without mass effect. Additional patchy and confluent T2/FLAIR hyperintense signal within the supratentorial and pontine white matter, nonspecific but compatible with moderate chronic microvascular ischemic disease. Encephalomalacia in the anterior left temporal lobe and high left perirolandic cortex. Evidence of prior hemorrhage in the periventricular right frontal lobe with susceptibility artifact and T2 hypointensity. No evidence of acute hemorrhage. No hydrocephalus, mass lesion, midline shift, or extra-axial fluid collection Vascular: See below. Skull and upper cervical spine: Degenerative change at the craniocervical junction with pannus along the dorsal dens. No focal marrow replacing lesion. Sinuses/Orbits: Moderate mucosal thickening of the right maxillary sinus, anterior right ethmoid air cells, and right frontal sinus. Other: Moderate right mastoid effusion. MRA HEAD FINDINGS Anterior circulation: Bilateral intracranial ICAs, MCAs, and ACAs are patent without proximal high-grade stenosis. Posterior circulation: Bilateral vertebral arteries, basilar artery and posterior cerebral arteries are patent without proximal high-grade stenosis. MRA NECK FINDINGS Great vessel origins are not imaged. Nondiagnostic evaluation of the proximal arteries in the lower neck. Visible common carotid arteries and internal carotid arteries are patent without evidence of significant (greater than 50%) stenosis. Visible vertebral arteries are patent with out evidence of significant (greater than 50%) stenosis. The vertebral arteries are codominant. IMPRESSION: MRI: 1. Acute infarct in the left thalamus. Punctate acute infarcts in the frontal lobes bilaterally. Given involvement of multiple vascular territories,  consider a central embolic etiology. 2. Encephalomalacia in the anterior left temporal lobe and high left perirolandic cortex. 3. Moderate chronic microvascular ischemic disease. 4. Prior hemorrhage in the periventricular right frontal lobe. MRA head: No large vessel occlusion or proximal high-grade stenosis. MRA neck: No visible occlusion or significant stenosis with limitations described above. Electronically Signed   By: Margaretha Sheffield M.D.   On: 07/24/2021 15:16       Tayshawn Purnell T. Heath  If 7PM-7AM, please contact night-coverage [email or web address] 07/25/2021, 2:18 PM

## 2021-07-25 NOTE — Plan of Care (Signed)
°  Problem: Education: Goal: Knowledge of General Education information will improve Description: Including pain rating scale, medication(s)/side effects and non-pharmacologic comfort measures 07/25/2021 1844 by Laure Kidney, RN Outcome: Progressing 07/25/2021 1843 by Laure Kidney, RN Outcome: Progressing   Problem: Health Behavior/Discharge Planning: Goal: Ability to manage health-related needs will improve 07/25/2021 1844 by Laure Kidney, RN Outcome: Progressing 07/25/2021 1843 by Laure Kidney, RN Outcome: Progressing   Problem: Clinical Measurements: Goal: Ability to maintain clinical measurements within normal limits will improve 07/25/2021 1844 by Laure Kidney, RN Outcome: Progressing 07/25/2021 1843 by Laure Kidney, RN Outcome: Progressing Goal: Will remain free from infection 07/25/2021 1844 by Laure Kidney, RN Outcome: Progressing 07/25/2021 1843 by Laure Kidney, RN Outcome: Progressing Goal: Diagnostic test results will improve 07/25/2021 1844 by Laure Kidney, RN Outcome: Progressing 07/25/2021 1843 by Laure Kidney, RN Outcome: Progressing Goal: Respiratory complications will improve 07/25/2021 1844 by Laure Kidney, RN Outcome: Progressing 07/25/2021 1843 by Laure Kidney, RN Outcome: Progressing Goal: Cardiovascular complication will be avoided 07/25/2021 1844 by Laure Kidney, RN Outcome: Progressing 07/25/2021 1843 by Laure Kidney, RN Outcome: Progressing   Problem: Nutrition: Goal: Adequate nutrition will be maintained 07/25/2021 1844 by Laure Kidney, RN Outcome: Progressing 07/25/2021 1843 by Laure Kidney, RN Outcome: Progressing   Problem: Coping: Goal: Level of anxiety will decrease 07/25/2021 1844 by Laure Kidney, RN Outcome: Progressing 07/25/2021 1843 by Laure Kidney, RN Outcome: Progressing   Problem: Elimination: Goal: Will not experience complications related to bowel motility 07/25/2021 1844 by Laure Kidney, RN Outcome:  Progressing 07/25/2021 1843 by Laure Kidney, RN Outcome: Progressing Goal: Will not experience complications related to urinary retention 07/25/2021 1844 by Laure Kidney, RN Outcome: Progressing 07/25/2021 1843 by Laure Kidney, RN Outcome: Progressing   Problem: Pain Managment: Goal: General experience of comfort will improve 07/25/2021 1844 by Laure Kidney, RN Outcome: Progressing 07/25/2021 1843 by Laure Kidney, RN Outcome: Progressing   Problem: Safety: Goal: Ability to remain free from injury will improve 07/25/2021 1844 by Laure Kidney, RN Outcome: Progressing 07/25/2021 1843 by Laure Kidney, RN Outcome: Progressing   Problem: Skin Integrity: Goal: Risk for impaired skin integrity will decrease 07/25/2021 1844 by Laure Kidney, RN Outcome: Progressing 07/25/2021 1843 by Laure Kidney, RN Outcome: Progressing   Problem: Education: Goal: Knowledge of disease or condition will improve 07/25/2021 1844 by Laure Kidney, RN Outcome: Progressing 07/25/2021 1843 by Laure Kidney, RN Outcome: Progressing Goal: Knowledge of secondary prevention will improve (SELECT ALL) 07/25/2021 1844 by Laure Kidney, RN Outcome: Progressing 07/25/2021 1843 by Laure Kidney, RN Outcome: Progressing Goal: Knowledge of patient specific risk factors will improve (INDIVIDUALIZE FOR PATIENT) 07/25/2021 1844 by Laure Kidney, RN Outcome: Progressing 07/25/2021 1843 by Laure Kidney, RN Outcome: Progressing Goal: Individualized Educational Video(s) 07/25/2021 1844 by Laure Kidney, RN Outcome: Progressing 07/25/2021 1843 by Laure Kidney, RN Outcome: Progressing   Problem: Health Behavior/Discharge Planning: Goal: Ability to manage health-related needs will improve 07/25/2021 1844 by Laure Kidney, RN Outcome: Progressing 07/25/2021 1843 by Laure Kidney, RN Outcome: Progressing   Problem: Ischemic Stroke/TIA Tissue Perfusion: Goal: Complications of ischemic stroke/TIA will be  minimized 07/25/2021 1844 by Laure Kidney, RN Outcome: Progressing 07/25/2021 1843 by Laure Kidney, RN Outcome: Progressing

## 2021-07-25 NOTE — Progress Notes (Signed)
Family at bedside requesting to speak to pharm tech about medications, pharmacist notified, Medication reconciliation was performed and in patient chart per pharmacist

## 2021-07-25 NOTE — TOC Initial Note (Signed)
Transition of Care [facility]) - Initial/Assessment Note    Patient Details  Name: [name] MRN: [id number] Date of Birth: February 02, 1946  Transition of Care [facility]) CM/SW Contact:    Pollie Friar, RN Phone Number: 07/25/2021, 10:54 AM  Clinical Narrative:                 Patient is from home with spouse. CM met with the patient, spouse and daughter at the bedside. Recommendations are for SNF rehab. Pt prefers home but is accepting she needs the rehab. Spouse on board for SNF rehab and is agreeable to having pt faxed out in the [facility] area. They prefer [facility]. CM has faxed pt out and updated Nikki with [facility] of the spouse's interest in her facility.  PASAR under review. CM will submit the needed information to PASAR once available.  TOC following.  Expected Discharge Plan: Skilled Nursing Facility Barriers to Discharge: Continued Medical Work up   Patient Goals and CMS Choice   CMS Medicare.gov Compare Post Acute Care list provided to:: Patient Represenative (must comment) Choice offered to / list presented to : Spouse, Adult Children, Patient  Expected Discharge Plan and Services Expected Discharge Plan: [place] In-house Referral: Clinical Social Work Discharge Planning Services: CM Consult Post Acute Care Choice: [place] Living arrangements for the past 2 months: [place]                                      Prior Living Arrangements/Services Living arrangements for the past 2 months: Single Family Home Lives with:: Spouse Patient language and need for interpreter reviewed:: Yes Do you feel safe going back to the place where you live?: Yes      Need for Family Participation in Patient Care: Yes (Comment) Care giver support system in place?: No (comment) Current home services: DME Criminal Activity/Legal Involvement Pertinent to Current Situation/Hospitalization: No - Comment as needed  Activities of Daily  Living Home Assistive Devices/Equipment: None ADL Screening (condition at time of admission) Patient's cognitive ability adequate to safely complete daily activities?: Yes Is the patient deaf or have difficulty hearing?: No Does the patient have difficulty seeing, even when wearing glasses/contacts?: No Does the patient have difficulty concentrating, remembering, or making decisions?: No Patient able to express need for assistance with ADLs?: Yes Does the patient have difficulty dressing or bathing?: Yes Independently performs ADLs?: No Communication: Independent Dressing (OT): Needs assistance Is this a change from baseline?: Change from baseline, expected to last >3 days Grooming: Needs assistance Is this a change from baseline?: Change from baseline, expected to last >3 days Feeding: Independent Bathing: Needs assistance Is this a change from baseline?: Change from baseline, expected to last >3 days Toileting: Needs assistance Is this a change from baseline?: Change from baseline, expected to last >3days In/Out Bed: Needs assistance Is this a change from baseline?: Change from baseline, expected to last >3 days Walks in Home: Needs assistance Is this a change from baseline?: Change from baseline, expected to last >3 days Does the patient have difficulty walking or climbing stairs?: Yes Weakness of Legs: Right Weakness of Arms/Hands: Right  Permission Sought/Granted                  Emotional Assessment Appearance:: Appears stated age Attitude/Demeanor/Rapport: Engaged Affect (typically observed): Accepting Orientation: : Oriented to Self, Oriented to Place, Oriented to  Situation   Psych Involvement: No (comment)  Admission diagnosis:  Right leg weakness [R29.898] Patient Active Problem List   Diagnosis Date Noted   Right leg weakness 07/23/2021   Erosive esophagitis/esophageal ulceration/duodenal ulceration 07/23/2021   Edema of right lower leg 07/23/2021   Acute  GI bleeding 07/08/2021   Azotemia 07/08/2021   Pressure injury of skin 07/08/2021   Asthma, chronic, unspecified asthma severity, with acute exacerbation 06/15/2021   Acute renal failure ([place]) 06/15/2021   Chronic combined systolic and diastolic CHF (congestive heart failure) ([place]) 06/15/2021   Acute respiratory failure with hypoxia ([place]) 06/14/2021   Duodenal ulcer with hemorrhage 03/29/2021   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   Gastroesophageal reflux disease 06/29/2020   Iron deficiency anemia 06/29/2020   SOB (shortness of breath) 03/11/2020   Pneumonia due to COVID-19 virus 03/11/2020   Traumatic seroma of lower leg, right, subsequent encounter 01/28/2020   Other secondary pulmonary hypertension ([place]) 12/03/2019   Medication management 11/04/2019   Acute on chronic combined systolic and diastolic congestive heart failure ([place]) 10/22/2019   Cardiac pacemaker in situ 04/18/2018   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   Personal history of PE (pulmonary embolism) 19/14/7829   Chronic systolic heart failure ([place]) 02/11/2018   Hyperkalemia 02/03/2018   Lower extremity edema 12/10/2017   Presence of Watchman left atrial appendage closure device 10/04/2017   Chronic respiratory failure with hypoxia ([place]) 10/03/2017   Dyspnea 07/26/2017   Atrial flutter ([place])    Peripheral vascular disease ([place])    Oligouria    Hypertension    Fractures, stress    Eczema    Depression    Constipation    Arthritis    Exacerbation of asthma 04/01/2017   Closed fracture of right distal radius 10/18/2016   Proteinuria 09/04/2016   History of kidney transplant 2017 07/25/2015   Immunosuppression ([place]) 07/25/2015   Nonischemic dilated cardiomyopathy (HCC)    S/P insertion of IVC (inferior vena caval) filter 04/07/2013   Use of cane as ambulatory aid 04/07/2013   S/P hip replacement 03/25/2012   DVT of lower extremity, bilateral ([place]) 12/21/2011   Personal history of DVT/PE s/p IVC  filter 12/08/2011   Obstructive sleep apnea 12/08/2011   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/2011   Pseudomonas aeruginosa infection 10/24/2011   CKD (chronic kidney disease), stage V ([place]) 09/14/2011   Hypercalcemia    Gout 09/10/2011   Acute blood loss anemia secondary to diverticular bleed  09/04/2011   Absence of right hip joint with antibiotic pacer 09/03/2011   Traumatic seroma of thigh ([place]) 06/11/2011   Clostridium difficile colitis 06/09/2011   Leukocytosis 06/08/2011   Symptomatic anemia 06/06/2011   SINUSITIS- ACUTE-NOS 09/27/2010   Atrial fibrillation s/p AVN ablation/PPM implant, s/p LAAC with Watchman 11/25/2008   CVA 09/28/2008   History of CVA with residual deficit 09/28/2008   Hemiplegia ([place]) 09/21/2008   Psoriasis 10/20/2007   Asthmatic bronchitis without complication 56/21/3086   PCP:  [name], NP Pharmacy:   [id number] [place], [address] [place] [address] Phone: [phone] Fax: [phone]  [place], [address] [place] [address] Phone: [phone] Fax: [phone]     Social Determinants of Health (SDOH) Interventions    Readmission Risk Interventions Readmission Risk Prevention Plan 07/13/2021 11/18/2019  Transportation Screening Complete Complete  PCP or Specialist Appt within 3-5 Days - Complete  [place] or [place] - Complete  Social Work Consult for Recovery Care Planning/Counseling - Complete  Palliative Care Screening - Not Applicable  Medication Review Press photographer) Complete Complete  HRI or Home Care Consult Complete -  SW Recovery Care/Counseling Consult Complete -  Palliative Care Screening Not Applicable -  [place] Not Applicable -  Some recent data might be hidden

## 2021-07-25 NOTE — NC FL2 (Signed)
[place] LEVEL OF CARE SCREENING TOOL     IDENTIFICATION  Patient Name: [name] Birthdate: 1945-11-09 Sex: female Admission Date (Current Location): 07/23/2021  [facility] and [place] Number:  Herbalist and Address:  [facility] [place], [place] [address], [place], [place] [address]      Provider Number: [id number]  Attending Physician Name and Address:  [name], MD  Relative Name and Phone Number:       Current Level of Care: Hospital Recommended Level of Care: [place] Prior Approval Number:    Date Approved/Denied:   PASRR Number: under review  Discharge Plan: SNF    Current Diagnoses: Patient Active Problem List   Diagnosis Date Noted   Right leg weakness 07/23/2021   Erosive esophagitis/esophageal ulceration/duodenal ulceration 07/23/2021   Edema of right lower leg 07/23/2021   Acute GI bleeding 07/08/2021   Azotemia 07/08/2021   Pressure injury of skin 07/08/2021   Asthma, chronic, unspecified asthma severity, with acute exacerbation 06/15/2021   Acute renal failure ([place]) 06/15/2021   Chronic combined systolic and diastolic CHF (congestive heart failure) ([place]) 06/15/2021   Acute respiratory failure with hypoxia ([place]) 06/14/2021   Duodenal ulcer with hemorrhage 03/29/2021   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   Gastroesophageal reflux disease 06/29/2020   Iron deficiency anemia 06/29/2020   SOB (shortness of breath) 03/11/2020   Pneumonia due to COVID-19 virus 03/11/2020   Traumatic seroma of lower leg, right, subsequent encounter 01/28/2020   Other secondary pulmonary hypertension ([place]) 12/03/2019   Medication management 11/04/2019   Acute on chronic combined systolic and diastolic congestive heart failure ([place]) 10/22/2019   Cardiac pacemaker in situ 04/18/2018   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   Personal history of PE (pulmonary embolism) 93/81/0175   Chronic systolic heart  failure ([place]) 02/11/2018   Hyperkalemia 02/03/2018   Lower extremity edema 12/10/2017   Presence of Watchman left atrial appendage closure device 10/04/2017   Chronic respiratory failure with hypoxia ([place]) 10/03/2017   Dyspnea 07/26/2017   Atrial flutter ([place])    Peripheral vascular disease ([place])    Oligouria    Hypertension    Fractures, stress    Eczema    Depression    Constipation    Arthritis    Exacerbation of asthma 04/01/2017   Closed fracture of right distal radius 10/18/2016   Proteinuria 09/04/2016   History of kidney transplant 2017 07/25/2015   Immunosuppression ([place]) 07/25/2015   Nonischemic dilated cardiomyopathy (HCC)    S/P insertion of IVC (inferior vena caval) filter 04/07/2013   Use of cane as ambulatory aid 04/07/2013   S/P hip replacement 03/25/2012   DVT of lower extremity, bilateral ([place]) 12/21/2011   Personal history of DVT/PE s/p IVC filter 12/08/2011   Obstructive sleep apnea 12/08/2011   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[place]) 10/24/2011   Pseudomonas aeruginosa infection 10/24/2011   CKD (chronic kidney disease), stage V ([place]) 09/14/2011   Hypercalcemia    Gout 09/10/2011   Acute blood loss anemia secondary to diverticular bleed  09/04/2011   Absence of right hip joint with antibiotic pacer 09/03/2011   Traumatic seroma of thigh ([place]) 06/11/2011   Clostridium difficile colitis 06/09/2011   Leukocytosis 06/08/2011   Symptomatic anemia 06/06/2011   SINUSITIS- ACUTE-NOS 09/27/2010   Atrial fibrillation s/p AVN ablation/PPM implant, s/p LAAC with Watchman 11/25/2008   CVA 09/28/2008   History of CVA with residual deficit 09/28/2008   Hemiplegia ([place]) 09/21/2008  Psoriasis 10/20/2007   Asthmatic bronchitis without complication 85/08/7739    Orientation RESPIRATION BLADDER Height & Weight     Self, Situation, Place  Normal Incontinent Weight: 55.9 kg Height:  5' 5.5" (166.4 cm)  BEHAVIORAL SYMPTOMS/MOOD NEUROLOGICAL BOWEL NUTRITION STATUS       Continent Diet (Renal 60/70-2-2-1200CC FR)  AMBULATORY STATUS COMMUNICATION OF NEEDS Skin   Extensive Assist Verbally Other (Comment) (Abrasion to left buttock/ ecchymosis to bilateral arms/ stage 2 to sacrum/ perineum)                       Personal Care Assistance Level of Assistance  Bathing, Feeding, Dressing Bathing Assistance: Maximum assistance Feeding assistance: Limited assistance Dressing Assistance: Maximum assistance     Functional Limitations Info  Sight, Hearing, Speech Sight Info: Impaired Hearing Info: Impaired Speech Info: Adequate    SPECIAL CARE FACTORS FREQUENCY  PT (By licensed PT), OT (By licensed OT), Speech therapy     PT Frequency: 5x/wk OT Frequency: 5x/wk     Speech Therapy Frequency: 5x/wk      Contractures Contractures Info: Not present    Additional Factors Info  Code Status, Allergies, Psychotropic Code Status Info: Full Allergies Info: warfarin/ ace inhibitors/ amiodarone/ amlodipine Psychotropic Info: Cymbalta DR 20 mg daily         Current Medications (07/25/2021):  This is the current hospital active medication list Current Facility-Administered Medications  Medication Dose Route Frequency Provider Last Rate Last Admin   0.9 %  sodium chloride infusion  250 mL Intravenous PRN Orma Flaming, MD       acetaminophen (TYLENOL) tablet 650 mg  650 mg Oral Q6H PRN Orma Flaming, MD       Or   acetaminophen (TYLENOL) suppository 650 mg  650 mg Rectal Q6H PRN Orma Flaming, MD       apixaban Arne [place]) tablet 2.5 mg  2.5 mg Oral BID Wendee Beavers T, MD       atorvastatin (LIPITOR) tablet 20 mg  20 mg Oral Daily Cyndia Skeeters, Taye T, MD   20 mg at 07/25/21 1043   carvedilol (COREG) tablet 6.25 mg  6.25 mg Oral BID PC Orma Flaming, MD   6.25 mg at 07/25/21 1038   DULoxetine (CYMBALTA) DR capsule 20 mg  20 mg Oral Daily Orma Flaming, MD   20 mg at 07/25/21 1044   fluticasone furoate-vilanterol (BREO ELLIPTA) 200-25 MCG/ACT 1 puff  1 puff  Inhalation Daily Orma Flaming, MD   1 puff at 07/25/21 1038   levalbuterol (XOPENEX) nebulizer solution 0.63 mg  0.63 mg Nebulization Q6H PRN [name], MD       mycophenolate (MYFORTIC) EC tablet 180 mg  180 mg Oral BID Orma Flaming, MD   180 mg at 07/25/21 1045   pantoprazole (PROTONIX) EC tablet 40 mg  40 mg Oral BID PC Orma Flaming, MD   40 mg at 07/25/21 1039   predniSONE (DELTASONE) tablet 5 mg  5 mg Oral Daily Orma Flaming, MD   5 mg at 07/25/21 1044   sodium bicarbonate tablet 650 mg  650 mg Oral BID Orma Flaming, MD   650 mg at 07/25/21 1046   sodium chloride flush (NS) 0.9 % injection 3 mL  3 mL Intravenous Q12H Orma Flaming, MD       sodium chloride flush (NS) 0.9 % injection 3 mL  3 mL Intravenous PRN Orma Flaming, MD       sucralfate (CARAFATE) tablet 1 g  1  g Oral QID Orma Flaming, MD   1 g at 07/24/21 2205   tacrolimus (PROGRAF) capsule 1 mg  1 mg Oral BID Orma Flaming, MD   1 mg at 07/25/21 1045     Discharge Medications: Please see discharge summary for a list of discharge medications.  Relevant Imaging Results:  Relevant Lab Results:   Additional Information SSN: [id number]--VL doesnt receive dialysis she has had a kidney transplant--on Prograf 1 BID  Pollie Friar, RN

## 2021-07-25 NOTE — Consult Note (Signed)
CARDIOLOGY CONSULT NOTE       Patient ID: MERLEEN PICAZO MRN: [id number] DOB/AGE: [birth year] 76 y.o.  Admit date: 07/23/2021 Referring Physician: Erlinda Hong Primary Physician: [name], NP Primary Cardiologist:  Turner/Bensimohn Reason for Consultation: Afib/CVA  Principal Problem:   Right leg weakness Active Problems:   Atrial fibrillation s/p AVN ablation/PPM implant, s/p LAAC with Watchman   Asthmatic bronchitis without complication   History of CVA with residual deficit   Acute blood loss anemia secondary to diverticular bleed    Personal history of DVT/PE s/p IVC filter   Obstructive sleep apnea   History of kidney transplant 2017   Hypertension   CKD (chronic kidney disease), stage V (HCC)   Depression   Chronic combined systolic and diastolic CHF (congestive heart failure) (HCC)   Erosive esophagitis/esophageal ulceration/duodenal ulceration   Edema of right lower leg   HPI:  76 y.o. with history of SSS, single lead PPM, chronic afib currently not on anticoagulation due to GI bleed and previous ICH on coumadin. History of 33 mm Watchman at [facility] with coiling of leak in March 2020. She has had previous ablation AVN/fib Also has had DVT/PE with IVC filter  Congenital dx with ASD closure at age 76.   TTE 07/24/21 with EF 35-40% similar to echo done 10/23/19 mild MR and severe TR   She has been off anticoagulation due to GI bleed source not obvious on flex sig, capsule endoscopy and EGD ? Diverticular bleeding   Admitted for concern worse RLE weakness MRI with acute infarct in left thalamus and punctate infarcts in frontal lobes ? Embolic given multiple locations   ECG is V pacing with likely underlying afib Hct stable over last 2 weeks 31.2   ROS All other systems reviewed and negative except as noted above  Past Medical History:  Diagnosis Date   Acute diastolic (congestive) heart failure (HCC) 08/12/2017   Acute GI bleeding 11/14/2019   Acute kidney injury  superimposed on chronic kidney disease ([place]) 11/17/2017   AKI (acute kidney injury) ([place]) 11/04/2017   Anemia    Anxiety    ARF (acute renal failure) ([place]) 06/06/2011   Arthritis    "shoulders; arms; hips" (02/04/2018)   Asthma    Atrial fibrillation (HCC)    Atrial flutter ([place])    s/p aflutter ablation at [facility]   Bacteriuria, asymptomatic 11/14/2020   Benign hypertension with ESRD (end-stage renal disease) ([place])    Blood transfusion    never had a reaction to blood transfusion   Cardiomyopathy Mar 2013   Mild, EF 50-55% by Mar 2013 ECHO, diast dysfxn II   Cardiomyopathy ([place])    CHF (congestive heart failure) ([place]) 2005   CHF (congestive heart failure) ([place])    Childhood asthma    Closed fracture of right distal radius 10/18/2016   CMV (cytomegalovirus infection) ([place]) 10/03/2015   Constipation    takes Miralax daily   Constipation    Depression    takes Zoloft daily   Distal radius fracture, right    DVT of lower extremity, bilateral ([place]) 12/21/11   "they're there now; been there for 2 wks"   Eczema    End stage renal disease ([place]) 11/06/2011   ESRD (end stage renal disease) on dialysis [facility]) 09/2011   07-19-2015 had Kidney transplant at [facility]; "don't get dialysis anymore" (02/04/2018)   Essential hypertension 07/07/2007   Qualifier: Diagnosis of  By: Garen Grams     Fractures, stress    in both  feet--6 OR 7 YRS AGO--RESOLVED   Generalized edema [id number]   GI bleed 11/14/2019   Gout    doesn't require meds    HCAP (healthcare-associated pneumonia) 09/10/2011   Hearing difficulty    Hematoma of left lower leg 05/17/2020   High cholesterol    History of CVA (cerebrovascular accident) without residual deficits    History of hip replacement, total, right    History of pneumonia    Hx of Clostridium difficile infection    Hypercalcemia    09/10/11   Hyperparathyroidism ([place]) 04/07/2013   Hypertension    takes Diltiazem daily     Hypomagnesemia 08/02/2015   Hypophosphatemia 11/08/2017   ICB (intracranial bleed) ([place])    Immunosuppression ([place]) 07/25/2015   Memory changes    Morbid obesity ([place])    Nonischemic dilated cardiomyopathy ([place])    Obesity 04/07/2013   Oligouria    Oral mucositis 02/22/2018   OSA on CPAP    PAF (paroxysmal atrial fibrillation) (HCC)     HX OF CEREBRAL BLEED WHILE ON COUMADIN-SO PT NOT ON ANY BLOOD THINNERS NOW   Peripheral vascular disease ([place])    Persistent atrial fibrillation ([place]) 08/12/2017   Overview:  Added automatically from request for surgery [phone]  Formatting of this note might be different from the original. Added automatically from request for surgery [phone]   Presence of Watchman left atrial appendage closure device 10/04/2017   Proteinuria 09/04/2016   Psoriasis 08/07/2016   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/25/2015   S/P insertion of IVC (inferior vena caval) filter    Sepsis ([place]) 11/08/2017   Stress fracture    bilateral feet   Stroke ([place]) 2009   denies residual on 02/04/2018;  hemorrhagic now off coumadin   Stroke ([place]) 07/17/2007   denies residual, hemorrhagic now off coumadin   Stroke due to intracerebral hemorrhage ([place]) 2009   Suture reaction 09/04/2016   Transfusion history    Use of cane as ambulatory aid     Family History  Problem Relation Age of Onset   Cancer Father    Diabetes Mother    Hypertension Mother    Arthritis Mother    Hodgkin's lymphoma Other 74       dscd---HODGKINS DISEASE   Hypertension Brother     Social History   Socioeconomic History   Marital status: Married    Spouse name: Not on file   Number of children: Not on file   Years of education: Not on file   Highest education level: Not on file  Occupational History   Occupation: retired  Tobacco Use   Smoking status: Never   Smokeless tobacco: Never  Vaping Use   Vaping Use: Never used  Substance and Sexual Activity   Alcohol use:  Never   Drug use: Never   Sexual activity: Yes    Birth control/protection: Post-menopausal  Other Topics Concern   Not on file  Social History Narrative   Married; retired; lives in [place] Strain: Not on file  Food Insecurity: Not on file  Transportation Needs: Not on file  Physical Activity: Not on file  Stress: Not on file  Social Connections: Not on file  Intimate Partner Violence: Not on file    Past Surgical History:  Procedure Laterality Date   AV FISTULA PLACEMENT  09/28/2011   Procedure: ARTERIOVENOUS (AV) FISTULA CREATION;  Surgeon: Rosetta Posner, MD;  Location: [place];  Service: Vascular;  Laterality: Left;  BIOPSY  07/10/2021   Procedure: BIOPSY;  Surgeon: Rush Landmark Telford Nab., MD;  Location: [facility];  Service: Gastroenterology;;   CARDIAC CATHETERIZATION     CARDIAC VALVE SURGERY  1960    FOR ATRIAL SEPTAL DEFECT   CHOLECYSTECTOMY     1980's   COLONOSCOPY     COLONOSCOPY WITH PROPOFOL N/A 11/16/2019   Procedure: COLONOSCOPY WITH PROPOFOL;  Surgeon: Juanita Craver, MD;  Location: [facility] ENDOSCOPY;  Service: Endoscopy;  Laterality: N/A;   CYSTOSCOPY     many yrs ago   ESOPHAGOGASTRODUODENOSCOPY N/A 07/10/2021   Procedure: ESOPHAGOGASTRODUODENOSCOPY (EGD);  Surgeon: Irving Copas., MD;  Location[facility];  Service: Gastroenterology;  Laterality: N/A;   ESOPHAGOGASTRODUODENOSCOPY (EGD) WITH PROPOFOL N/A 11/16/2019   Procedure: ESOPHAGOGASTRODUODENOSCOPY (EGD) WITH PROPOFOL;  Surgeon: Juanita Craver, MD;  Location: [facility]COPY;  Service: Endoscopy;  Laterality: N/A;   ESOPHAGOGASTRODUODENOSCOPY (EGD) WITH PROPOFOL N/A 03/20/2021   Procedure: ESOPHAGOGASTRODUODENOSCOPY (EGD) WITH PROPOFOL;  Surgeon: Milus Banister, MD;  Location: [facility] ENDOSCOPY;  Service: Endoscopy;  Laterality: N/A;   FEMUR FRACTURE SURGERY Right 03/2011; 09/03/2011   "had 2, 2 wk apart in 2012; broke it again 08/2011 & had  OR"   [place] N/A 07/10/2021   Procedure: FLEXIBLE SIGMOIDOSCOPY;  Surgeon: Rush Landmark Telford Nab., MD;  Location: [facility];  Service: Gastroenterology;  Laterality: N/A;   FRACTURE SURGERY     GIVENS CAPSULE STUDY N/A 07/11/2021   Procedure: GIVENS CAPSULE STUDY;  Surgeon: Juanita Craver, MD;  [facility]: [facility];  Service: Endoscopy;  Laterality: N/A;   HEMOSTASIS CONTROL  03/20/2021   Procedure: HEMOSTASIS CONTROL;  Surgeon: Milus Banister, MD;  Location: [place];  Service: Endoscopy;;   HOT HEMOSTASIS N/A 03/20/2021   Procedure: HOT HEMOSTASIS (ARGON PLASMA COAGULATION/BICAP);  Surgeon: Milus Banister, MD;  Location: [facility] ENDOSCOPY;  Service: Endoscopy;  Laterality: N/A;   HOT HEMOSTASIS N/A 07/10/2021   Procedure: HOT HEMOSTASIS (ARGON PLASMA COAGULATION/BICAP);  Surgeon: Irving Copas., MD;  Location: [facility];  Service: Gastroenterology;  Laterality: N/A;   I & D EXTREMITY  09/15/2011   Procedure: IRRIGATION AND DEBRIDEMENT EXTREMITY w REMOVAL OF HARDWARE;  Surgeon: [name], MD;  Location: [place];  Service: Orthopedics;  Laterality: Right;   INCISION AND DRAINAGE HIP  12/21/2011   Procedure: IRRIGATION AND DEBRIDEMENT HIP;  Surgeon: [name], MD;  Location: [place];  Service: Orthopedics;  Laterality: Right;  I&D RIGHT HIP WITH PLACEMENT ANTIBIOTIC SPACER   INSERTION OF DIALYSIS CATHETER     Procedure: INSERTION OF DIALYSIS CATHETER;  Surgeon: Rosetta Posner, MD;  Location: [place];  Service: Vascular;  Laterality: Right;   IRRIGATION AND DEBRIDEMENT ABSCESS  12/21/11   right hip   JOINT REPLACEMENT     KIDNEY TRANSPLANT  07/19/15   LAPAROSCOPIC GASTRIC BANDING  2008   LEFT ATRIAL APPENDAGE OCCLUSION  10/04/2017   OPEN REDUCTION INTERNAL FIXATION (ORIF) DISTAL RADIAL FRACTURE Right 10/18/2016   Procedure: OPEN REDUCTION INTERNAL FIXATION (ORIF) RIGHT DISTAL RADIAL FRACTURE;  Surgeon: Marchia Bond, MD;  Location: [place];  Service: Orthopedics;  Laterality: Right;   SUBMUCOSAL TATTOO INJECTION  07/10/2021   Procedure: SUBMUCOSAL TATTOO INJECTION;  Surgeon: Irving Copas., MD;  Location[facility];  Service: Gastroenterology;;   TOTAL HIP ARTHROPLASTY Right 02/2011   right THA 02/2011, I&D/removal of hardware 09/2011,, repeat I&D Jun 2013, reimplantation R THA 03-26-2012   TOTAL HIP REVISION  03/25/2012   Procedure: TOTAL HIP REVISION;  Surgeon: Rodman Key  Marian Sorrow, MD;  Location: [facility];  Service: Orthopedics;  Laterality: Right;  Right Total Hip Reimplantation   TUBAL LIGATION     VENA CAVA FILTER PLACEMENT  11/2011      Current Facility-Administered Medications:    0.9 %  sodium chloride infusion, 250 mL, Intravenous, PRN, Orma Flaming, MD   acetaminophen (TYLENOL) tablet 650 mg, 650 mg, Oral, Q6H PRN **OR** acetaminophen (TYLENOL) suppository 650 mg, 650 mg, Rectal, Q6H PRN, Orma Flaming, MD   apixaban (ELIQUIS) tablet 2.5 mg, 2.5 mg, Oral, BID, Gonfa, Taye T, MD   atorvastatin (LIPITOR) tablet 20 mg, 20 mg, Oral, Daily, Cyndia Skeeters, Taye T, MD, 20 mg at 07/25/21 1043   carvedilol (COREG) tablet 6.25 mg, 6.25 mg, Oral, BID PC, Orma Flaming, MD, 6.25 mg at 07/25/21 1038   DULoxetine (CYMBALTA) DR capsule 20 mg, 20 mg, Oral, Daily, Orma Flaming, MD, 20 mg at 07/25/21 1044   fluticasone furoate-vilanterol (BREO ELLIPTA) 200-25 MCG/ACT 1 puff, 1 puff, Inhalation, Daily, Orma Flaming, MD, 1 puff at 07/25/21 1038   levalbuterol (XOPENEX) nebulizer solution 0.63 mg, 0.63 mg, Nebulization, Q6H PRN, Cyndia Skeeters, Taye T, MD   mycophenolate (MYFORTIC) EC tablet 180 mg, 180 mg, Oral, BID, Orma Flaming, MD, 180 mg at 07/25/21 1045   pantoprazole (PROTONIX) EC tablet 40 mg, 40 mg, Oral, BID PC, Orma Flaming, MD, 40 mg at 07/25/21 1039   predniSONE (DELTASONE) tablet 5 mg, 5 mg, Oral, Daily, Orma Flaming, MD, 5 mg at 07/25/21 1044   sodium bicarbonate tablet 650 mg, 650 mg, Oral, BID, Orma Flaming, MD, 650 mg at 07/25/21 1046   sodium chloride flush (NS) 0.9 % injection 3 mL, 3 mL, Intravenous, Q12H, Orma Flaming, MD, 3 mL at 07/25/21 1047   sodium chloride flush (NS) 0.9 % injection 3 mL, 3 mL, Intravenous, PRN, Orma Flaming, MD   sucralfate (CARAFATE) tablet 1 g, 1 g, Oral, QID, Orma Flaming, MD, 1 g at 07/25/21 1047   tacrolimus (PROGRAF) capsule 1 mg, 1 mg, Oral, BID, Orma Flaming, MD, 1 mg at 07/25/21 1045  apixaban  2.5 mg Oral BID   atorvastatin  20 mg Oral Daily   carvedilol  6.25 mg Oral BID PC   DULoxetine  20 mg Oral Daily   fluticasone furoate-vilanterol  1 puff Inhalation Daily   mycophenolate  180 mg Oral BID   pantoprazole  40 mg Oral BID PC   predniSONE  5 mg Oral Daily   sodium bicarbonate  650 mg Oral BID   sodium chloride flush  3 mL Intravenous Q12H   sucralfate  1 g Oral QID   tacrolimus  1 mg Oral BID    sodium chloride      Physical Exam: Blood pressure 137/73, pulse 71, temperature 97.7 F (36.5 C), temperature source Oral, resp. rate 16, height 5' 5.5" (1.664 m), weight 55.9 kg, SpO2 100 %.    Frail black female Proptosis Left facial droop RUE/RLE weakness Post renal transplant RLQ Lungs clear  JVP elevated with TR murmur  No edema   Labs:   Lab Results  Component Value Date   WBC 6.7 07/25/2021   HGB 9.3 (L) 07/25/2021   HCT 31.2 (L) 07/25/2021   MCV 87.9 07/25/2021   PLT 226 07/25/2021    Recent Labs  Lab 07/23/21 1043 07/23/21 1108 07/25/21 0620  NA 138   < > 140  K 4.5   < > 4.1  CL 110   < > 112*  CO2 20*   < >  22  BUN 31*   < > 31*  CREATININE 3.09*   < > 3.12*  CALCIUM 8.2*   < > 8.3*  PROT 4.6*  --   --   BILITOT 1.6*  --   --   ALKPHOS 54  --   --   ALT 10  --   --   AST 47*  --   --   GLUCOSE 101*   < > 98   < > = values in this interval not displayed.   Lab Results  Component Value Date   CKTOTAL 374 (H) 03/26/2012   CKMB 2.8 03/26/2012   TROPONINI 0.38 (HH) 02/04/2018     Lab Results  Component Value Date   CHOL 124 07/24/2021   CHOL 136 11/03/2020   CHOL  11/16/2008    184        ATP III CLASSIFICATION:  <200     mg/dL   Desirable  200-239  mg/dL   Borderline High  >=240    mg/dL   High          Lab Results  Component Value Date   HDL 34 (L) 07/24/2021   HDL 52.10 11/03/2020   HDL 33 (L) 11/16/2008   Lab Results  Component Value Date   LDLCALC 71 07/24/2021   LDLCALC 69 11/03/2020   LDLCALC (H) 11/16/2008    130        Total Cholesterol/HDL:CHD Risk Coronary Heart Disease Risk Table                     Men   Women  1/2 Average Risk   3.4   3.3  Average Risk       5.0   4.4  2 X Average Risk   9.6   7.1  3 X Average Risk  23.4   11.0        Use the calculated Patient Ratio above and the CHD Risk Table to determine the patient's CHD Risk.        ATP III CLASSIFICATION (LDL):  <100     mg/dL   Optimal  100-129  mg/dL   Near or Above                    Optimal  130-159  mg/dL   Borderline  160-189  mg/dL   High  >190     mg/dL   Very High   Lab Results  Component Value Date   TRIG 95 07/24/2021   TRIG 76.0 11/03/2020   TRIG 157 (H) 09/15/2011   Lab Results  Component Value Date   CHOLHDL 3.6 07/24/2021   CHOLHDL 3 11/03/2020   CHOLHDL 5.6 11/16/2008   No results found for: LDLDIRECT    Radiology: DG Chest 2 View  Result Date: 07/01/2021 CLINICAL DATA:  Shortness of breath.  Transplant patient. EXAM: CHEST - 2 VIEW COMPARISON:  June 14, 2021 FINDINGS: Cardiomegaly. The hila and mediastinum are unchanged. No pneumothorax. No nodules or masses. Possible mild opacity in left base on the frontal view. Stable pacemaker. No other acute abnormalities. IMPRESSION: Possible mild opacity in the left base may represent atelectasis, confluence of shadows, or developing infiltrate. Recommend short-term follow-up imaging to ensure resolution. Electronically Signed   By: Dorise Bullion III M.D.   On: 07/01/2021 21:14   MR ANGIO HEAD  WO CONTRAST  Result Date: 07/24/2021 CLINICAL DATA:  Neuro deficit, acute, stroke suspected EXAM: MRI HEAD WITHOUT CONTRAST  MRA HEAD WITHOUT CONTRAST MRA NECK WITHOUT CONTRAST TECHNIQUE: Multiplanar, multiecho pulse sequences of the brain and surrounding structures were obtained without intravenous contrast. Angiographic images of the Circle of Willis were obtained using MRA technique without intravenous contrast. Angiographic images of the neck were obtained using MRA technique without intravenous contrast. Carotid stenosis measurements (when applicable) are obtained utilizing NASCET criteria, using the distal internal carotid diameter as the denominator. COMPARISON:  CT head 07/23/2021. FINDINGS: MRI HEAD FINDINGS Brain: Acute infarct in the left thalamus. Punctate infarcts in the bilateral frontal lobes (series 4, image 35). Associated edema without mass effect. Additional patchy and confluent T2/FLAIR hyperintense signal within the supratentorial and pontine white matter, nonspecific but compatible with moderate chronic microvascular ischemic disease. Encephalomalacia in the anterior left temporal lobe and high left perirolandic cortex. Evidence of prior hemorrhage in the periventricular right frontal lobe with susceptibility artifact and T2 hypointensity. No evidence of acute hemorrhage. No hydrocephalus, mass lesion, midline shift, or extra-axial fluid collection Vascular: See below. Skull and upper cervical spine: Degenerative change at the craniocervical junction with pannus along the dorsal dens. No focal marrow replacing lesion. Sinuses/Orbits: Moderate mucosal thickening of the right maxillary sinus, anterior right ethmoid air cells, and right frontal sinus. Other: Moderate right mastoid effusion. MRA HEAD FINDINGS Anterior circulation: Bilateral intracranial ICAs, MCAs, and ACAs are patent without proximal high-grade stenosis. Posterior circulation: Bilateral vertebral arteries, basilar artery and  posterior cerebral arteries are patent without proximal high-grade stenosis. MRA NECK FINDINGS Great vessel origins are not imaged. Nondiagnostic evaluation of the proximal arteries in the lower neck. Visible common carotid arteries and internal carotid arteries are patent without evidence of significant (greater than 50%) stenosis. Visible vertebral arteries are patent with out evidence of significant (greater than 50%) stenosis. The vertebral arteries are codominant. IMPRESSION: MRI: 1. Acute infarct in the left thalamus. Punctate acute infarcts in the frontal lobes bilaterally. Given involvement of multiple vascular territories, consider a central embolic etiology. 2. Encephalomalacia in the anterior left temporal lobe and high left perirolandic cortex. 3. Moderate chronic microvascular ischemic disease. 4. Prior hemorrhage in the periventricular right frontal lobe. MRA head: No large vessel occlusion or proximal high-grade stenosis. MRA neck: No visible occlusion or significant stenosis with limitations described above. Electronically Signed   By: Margaretha Sheffield M.D.   On: 07/24/2021 15:16   MR ANGIO NECK WO CONTRAST  Result Date: 07/24/2021 CLINICAL DATA:  Neuro deficit, acute, stroke suspected EXAM: MRI HEAD WITHOUT CONTRAST MRA HEAD WITHOUT CONTRAST MRA NECK WITHOUT CONTRAST TECHNIQUE: Multiplanar, multiecho pulse sequences of the brain and surrounding structures were obtained without intravenous contrast. Angiographic images of the Circle of Willis were obtained using MRA technique without intravenous contrast. Angiographic images of the neck were obtained using MRA technique without intravenous contrast. Carotid stenosis measurements (when applicable) are obtained utilizing NASCET criteria, using the distal internal carotid diameter as the denominator. COMPARISON:  CT head 07/23/2021. FINDINGS: MRI HEAD FINDINGS Brain: Acute infarct in the left thalamus. Punctate infarcts in the bilateral frontal  lobes (series 4, image 35). Associated edema without mass effect. Additional patchy and confluent T2/FLAIR hyperintense signal within the supratentorial and pontine white matter, nonspecific but compatible with moderate chronic microvascular ischemic disease. Encephalomalacia in the anterior left temporal lobe and high left perirolandic cortex. Evidence of prior hemorrhage in the periventricular right frontal lobe with susceptibility artifact and T2 hypointensity. No evidence of acute hemorrhage. No hydrocephalus, mass lesion, midline shift, or extra-axial fluid collection Vascular: See below. Skull and upper  cervical spine: Degenerative change at the craniocervical junction with pannus along the dorsal dens. No focal marrow replacing lesion. Sinuses/Orbits: Moderate mucosal thickening of the right maxillary sinus, anterior right ethmoid air cells, and right frontal sinus. Other: Moderate right mastoid effusion. MRA HEAD FINDINGS Anterior circulation: Bilateral intracranial ICAs, MCAs, and ACAs are patent without proximal high-grade stenosis. Posterior circulation: Bilateral vertebral arteries, basilar artery and posterior cerebral arteries are patent without proximal high-grade stenosis. MRA NECK FINDINGS Great vessel origins are not imaged. Nondiagnostic evaluation of the proximal arteries in the lower neck. Visible common carotid arteries and internal carotid arteries are patent without evidence of significant (greater than 50%) stenosis. Visible vertebral arteries are patent with out evidence of significant (greater than 50%) stenosis. The vertebral arteries are codominant. IMPRESSION: MRI: 1. Acute infarct in the left thalamus. Punctate acute infarcts in the frontal lobes bilaterally. Given involvement of multiple vascular territories, consider a central embolic etiology. 2. Encephalomalacia in the anterior left temporal lobe and high left perirolandic cortex. 3. Moderate chronic microvascular ischemic disease.  4. Prior hemorrhage in the periventricular right frontal lobe. MRA head: No large vessel occlusion or proximal high-grade stenosis. MRA neck: No visible occlusion or significant stenosis with limitations described above. Electronically Signed   By: Margaretha Sheffield M.D.   On: 07/24/2021 15:16   MR BRAIN WO CONTRAST  Result Date: 07/24/2021 CLINICAL DATA:  Neuro deficit, acute, stroke suspected EXAM: MRI HEAD WITHOUT CONTRAST MRA HEAD WITHOUT CONTRAST MRA NECK WITHOUT CONTRAST TECHNIQUE: Multiplanar, multiecho pulse sequences of the brain and surrounding structures were obtained without intravenous contrast. Angiographic images of the Circle of Willis were obtained using MRA technique without intravenous contrast. Angiographic images of the neck were obtained using MRA technique without intravenous contrast. Carotid stenosis measurements (when applicable) are obtained utilizing NASCET criteria, using the distal internal carotid diameter as the denominator. COMPARISON:  CT head 07/23/2021. FINDINGS: MRI HEAD FINDINGS Brain: Acute infarct in the left thalamus. Punctate infarcts in the bilateral frontal lobes (series 4, image 35). Associated edema without mass effect. Additional patchy and confluent T2/FLAIR hyperintense signal within the supratentorial and pontine white matter, nonspecific but compatible with moderate chronic microvascular ischemic disease. Encephalomalacia in the anterior left temporal lobe and high left perirolandic cortex. Evidence of prior hemorrhage in the periventricular right frontal lobe with susceptibility artifact and T2 hypointensity. No evidence of acute hemorrhage. No hydrocephalus, mass lesion, midline shift, or extra-axial fluid collection Vascular: See below. Skull and upper cervical spine: Degenerative change at the craniocervical junction with pannus along the dorsal dens. No focal marrow replacing lesion. Sinuses/Orbits: Moderate mucosal thickening of the right maxillary sinus,  anterior right ethmoid air cells, and right frontal sinus. Other: Moderate right mastoid effusion. MRA HEAD FINDINGS Anterior circulation: Bilateral intracranial ICAs, MCAs, and ACAs are patent without proximal high-grade stenosis. Posterior circulation: Bilateral vertebral arteries, basilar artery and posterior cerebral arteries are patent without proximal high-grade stenosis. MRA NECK FINDINGS Great vessel origins are not imaged. Nondiagnostic evaluation of the proximal arteries in the lower neck. Visible common carotid arteries and internal carotid arteries are patent without evidence of significant (greater than 50%) stenosis. Visible vertebral arteries are patent with out evidence of significant (greater than 50%) stenosis. The vertebral arteries are codominant. IMPRESSION: MRI: 1. Acute infarct in the left thalamus. Punctate acute infarcts in the frontal lobes bilaterally. Given involvement of multiple vascular territories, consider a central embolic etiology. 2. Encephalomalacia in the anterior left temporal lobe and high left perirolandic cortex. 3. Moderate chronic  microvascular ischemic disease. 4. Prior hemorrhage in the periventricular right frontal lobe. MRA head: No large vessel occlusion or proximal high-grade stenosis. MRA neck: No visible occlusion or significant stenosis with limitations described above. Electronically Signed   By: Margaretha Sheffield M.D.   On: 07/24/2021 15:16   US Renal Transplant w/Doppler  Result Date: 07/10/2021 CLINICAL DATA:  Acute renal failure.  History of kidney transplant. EXAM: ULTRASOUND OF RENAL TRANSPLANT WITH RENAL DOPPLER ULTRASOUND TECHNIQUE: Ultrasound examination of the renal transplant was performed with gray-scale, color and duplex doppler evaluation. COMPARISON:  03/23/2021 FINDINGS: Transplant kidney location: Right lower quadrant Transplant Kidney: Renal measurements: 8.4 x 4.4 x 4.7 cm = volume: 90.73mL. Normal in size and parenchymal echogenicity. No  evidence of mass or hydronephrosis. No peri-transplant fluid collection seen. Color flow in the main renal artery:  Yes Color flow in the main renal vein:  Yes Duplex Doppler Evaluation: Main Renal Artery Resistive Index: 0.7 Venous waveform in main renal vein:  present/absent Intrarenal resistive index in upper pole:  0.7 (normal 0.6-0.8; equivocal 0.8-0.9; abnormal >= 0.9) Intrarenal resistive index in lower pole: 0.7 (normal 0.6-0.8; equivocal 0.8-0.9; abnormal >= 0.9) Bladder: Normal for degree of bladder distention. Ureteral jets identified on color Doppler imaging. Other findings:  None. IMPRESSION: Stable, unremarkable appearance of the right lower quadrant renal transplant. Electronically Signed   By: Misty Stanley M.D.   On: 07/10/2021 10:57   ECHOCARDIOGRAM COMPLETE  Result Date: 07/24/2021    ECHOCARDIOGRAM REPORT   Patient Name:   SOPHINA MITTEN Date of Exam: 07/24/2021 Medical Rec #:  [id number]     Height:       65.5 in Accession #:    [id number]    Weight:       123.2 lb Date of Birth:  04-19-1946    BSA:          1.619 m Patient Age:    66 years      BP:           151/81 mmHg Patient Gender: F             HR:           71 bpm. Exam Location:  Inpatient Procedure: 2D Echo Indications:    stroke  History:        Patient has prior history of Echocardiogram examinations, most                 recent 10/23/2019. Pacemaker, chronic kidney disease,                 Arrythmias:Atrial Fibrillation; Risk Factors:Sleep Apnea.  Sonographer:    Johny Chess RDCS Referring Phys: [place]  1. Left ventricular ejection fraction, by estimation, is 35 to 40%. The left ventricle has moderately decreased function. The left ventricle demonstrates regional wall motion abnormalities (see scoring diagram/findings for description). There is mild left ventricular hypertrophy. Left ventricular diastolic parameters are indeterminate.  2. Right ventricular systolic function is mildly reduced. The  right ventricular size is mildly enlarged. There is severely elevated pulmonary artery systolic pressure. The estimated right ventricular systolic pressure is 58.8 mmHg.  3. Left atrial size was moderately dilated.  4. The mitral valve is normal in structure. Mild mitral valve regurgitation.  5. The tricuspid valve is abnormal. Tricuspid valve regurgitation is severe.  6. The aortic valve is tricuspid. Aortic valve regurgitation is mild. No aortic stenosis is present.  7. The inferior vena cava is dilated  in size with >50% respiratory variability, suggesting right atrial pressure of 8 mmHg. FINDINGS  Left Ventricle: Left ventricular ejection fraction, by estimation, is 35 to 40%. The left ventricle has moderately decreased function. The left ventricle demonstrates regional wall motion abnormalities. The left ventricular internal cavity size was normal in size. There is mild left ventricular hypertrophy. Left ventricular diastolic parameters are indeterminate.  LV Wall Scoring: The entire septum and basal inferior segment are akinetic. The apex is hypokinetic. The entire anterior wall, entire lateral wall, and mid and distal inferior wall are normal. Right Ventricle: The right ventricular size is mildly enlarged. Right vetricular wall thickness was not well visualized. Right ventricular systolic function is mildly reduced. There is severely elevated pulmonary artery systolic pressure. The tricuspid regurgitant velocity is 3.65 m/s, and with an assumed right atrial pressure of 8 mmHg, the estimated right ventricular systolic pressure is 74.6 mmHg. Left Atrium: Left atrial size was moderately dilated. Right Atrium: Right atrial size was normal in size. Pericardium: Trivial pericardial effusion is present. Mitral Valve: The mitral valve is normal in structure. Mild mitral valve regurgitation. Tricuspid Valve: The tricuspid valve is abnormal. Tricuspid valve regurgitation is severe. Aortic Valve: The aortic valve is  tricuspid. Aortic valve regurgitation is mild. No aortic stenosis is present. Pulmonic Valve: The pulmonic valve was grossly normal. Pulmonic valve regurgitation is trivial. Aorta: The aortic root and ascending aorta are structurally normal, with no evidence of dilitation. Venous: The inferior vena cava is dilated in size with greater than 50% respiratory variability, suggesting right atrial pressure of 8 mmHg. IAS/Shunts: The interatrial septum was not well visualized.  LEFT VENTRICLE PLAX 2D LVIDd:         4.60 cm     Diastology LVIDs:         3.40 cm     LV e' medial:    5.87 cm/s LV PW:         1.00 cm     LV E/e' medial:  11.3 LV IVS:        1.10 cm     LV e' lateral:   8.16 cm/s LVOT diam:     2.00 cm     LV E/e' lateral: 8.1 LV SV:         44 LV SV Index:   27 LVOT Area:     3.14 cm  LV Volumes (MOD) LV vol d, MOD A2C: 63.1 ml LV vol d, MOD A4C: 73.5 ml LV vol s, MOD A2C: 37.3 ml LV vol s, MOD A4C: 51.8 ml LV SV MOD A2C:     25.8 ml LV SV MOD A4C:     73.5 ml LV SV MOD BP:      25.2 ml RIGHT VENTRICLE            IVC RV S prime:     9.03 cm/s  IVC diam: 2.30 cm TAPSE (M-mode): 1.6 cm LEFT ATRIUM            Index        RIGHT ATRIUM           Index LA diam:      4.50 cm  2.78 cm/m   RA Area:     14.30 cm LA Vol (A2C): 71.4 ml  44.10 ml/m  RA Volume:   30.60 ml  18.90 ml/m LA Vol (A4C): 131.0 ml 74.90 ml/m  AORTIC VALVE LVOT Vmax:   85.90 cm/s LVOT Vmean:  53.400 cm/s LVOT VTI:  0.140 m  AORTA Ao Root diam: 3.10 cm Ao Asc diam:  3.20 cm MITRAL VALVE               TRICUSPID VALVE MV Area (PHT): 4.39 cm    TR Peak grad:   53.3 mmHg MV Decel Time: 173 msec    TR Vmax:        365.00 cm/s MV E velocity: 66.30 cm/s MV A velocity: 34.50 cm/s  SHUNTS MV E/A ratio:  1.92        Systemic VTI:  0.14 m                            Systemic Diam: 2.00 cm Oswaldo Milian MD Electronically signed by Oswaldo Milian MD Signature Date/Time: 07/24/2021/2:30:15 PM    Final    CT HEAD CODE STROKE WO  CONTRAST  Result Date: 07/23/2021 CLINICAL DATA:  Code stroke. 76 year old female with left side weakness. EXAM: CT HEAD WITHOUT CONTRAST TECHNIQUE: Contiguous axial images were obtained from the base of the skull through the vertex without intravenous contrast. COMPARISON:  Head CT without contrast 09/12/2011. FINDINGS: Brain: Generalized cerebral volume loss since 2013. No ventriculomegaly. No midline shift, mass effect, or evidence of intracranial mass lesion. Chronic appearing lacunar infarct right thalamus. Larger more indistinct hypodense area in the left medial thalamus. Confluent bilateral cerebral white matter hypodensity. Encephalomalacia in the anterior inferior left temporal lobe. And small areas of chronic appearing cortical encephalomalacia posterosuperior left frontal gyrus. Vague asymmetric hypodensity also in the left pons. No acute intracranial hemorrhage identified. No acute cortically based infarct identified. Vascular: Calcified atherosclerosis at the skull base. Skull: No acute osseous abnormality identified. Sinuses/Orbits: Mild right paranasal sinus mucosal thickening. New opacified right tympanic cavity and right mastoid air cells. Other: Visualized orbits and scalp soft tissues are within normal limits. ASPECTS [facility] Stroke Program Early CT Score) Total score (0-10 with 10 being normal): 10 IMPRESSION: 1. Advanced chronic ischemic disease, including age indeterminate small vessel disease in the left thalamus and possibly also pons. A right thalamic lacune has a more chronic appearance. 2. No acute cortically based infarct or acute intracranial hemorrhage identified. ASPECTS 10. 3. These results were communicated to Dr. Quinn Axe at 10:55 am on 07/23/2021 by text page via the [facility] messaging system. Electronically Signed   By: Genevie Ann M.D.   On: 07/23/2021 11:00   VAS [name] LOWER EXTREMITY VENOUS (DVT) (7a-7p)  Result Date: 07/24/2021  Lower Venous DVT Study Patient Name:  ALLEYA DEMETER  Date  of Exam:   07/23/2021 Medical Rec #: [id number]      Accession #:    [id number] Date of Birth: 03-10-1946     Patient Gender: F Patient Age:   89 years Exam Location:  [facility] Procedure:      VAS [name] LOWER EXTREMITY VENOUS (DVT) Referring Phys: MATTHEW TRIFAN --------------------------------------------------------------------------------  Indications: Right leg swelling.  Limitations: Size and depth of vessels. Comparison Study: 12-21-2017 Bilateral lower extremity venous was negative for                   acute DVT. 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 Bilateral testing is considered an integral part of a complete examination. Limited examinations for reoccurring indications may be performed as noted. The reflux portion of the exam is performed with the patient in reverse Trendelenburg.  +---------+---------------+---------+-----------+----------+-------------------+  RIGHT     Compressibility Phasicity Spontaneity Properties Thrombus Aging       +---------+---------------+---------+-----------+----------+-------------------+  CFV       Full            Yes       Yes                                         +---------+---------------+---------+-----------+----------+-------------------+  SFJ       Full                                                                  +---------+---------------+---------+-----------+----------+-------------------+  FV Prox   Full                                                                  +---------+---------------+---------+-----------+----------+-------------------+  FV Mid    Full                                                                   +---------+---------------+---------+-----------+----------+-------------------+  FV Distal Full                                                                  +---------+---------------+---------+-----------+----------+-------------------+  PFV       Full                                                                  +---------+---------------+---------+-----------+----------+-------------------+  POP       Full            Yes       Yes                                         +---------+---------------+---------+-----------+----------+-------------------+  PTV       Full                                                                  +---------+---------------+---------+-----------+----------+-------------------+  PERO      Full                                             Not well visualized  +---------+---------------+---------+-----------+----------+-------------------+  Gastroc   Full                                                                  +---------+---------------+---------+-----------+----------+-------------------+   +----+---------------+---------+-----------+----------+--------------+  LEFT Compressibility Phasicity Spontaneity Properties Thrombus Aging  +----+---------------+---------+-----------+----------+--------------+  CFV  Full            Yes       Yes                                    +----+---------------+---------+-----------+----------+--------------+     Summary: RIGHT: - There is no evidence of deep vein thrombosis in the lower extremity.  - No cystic structure found in the popliteal fossa.  LEFT: - No evidence of common femoral vein obstruction.  *See table(s) above for measurements and observations. Electronically signed by Servando Snare MD on 07/24/2021 at 6:05:05 PM.    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 She has watchman device in with sealing of leak in  2020 Recent GI bleed with no overt source ? 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 From PPM wire GDMT limited by renal transplant and Cr 3.12 today Diuretic dosing per nephrology  Afib:  V pacing on coreg see above regarding anticoagulation Post Watchman 33 mm device 2019 with coiling of crescent shaped leak in 2020   Signed: Jenkins Rouge 07/25/2021, 12:04 PM

## 2021-07-25 NOTE — Progress Notes (Signed)
Re: [name] DOB: 1946/02/28 Date: 07/25/2021   To Whom It May Concern:  Please be advised that the above-named patient will require a short-term nursing home stay--anticipated 30 days or less for rehabilitation and strengthening. The plan is for home.

## 2021-07-26 DIAGNOSIS — E876 Hypokalemia: Secondary | ICD-10-CM | POA: Diagnosis not present

## 2021-07-26 DIAGNOSIS — I69320 Aphasia following cerebral infarction: Secondary | ICD-10-CM | POA: Diagnosis not present

## 2021-07-26 DIAGNOSIS — Z45018 Encounter for adjustment and management of other part of cardiac pacemaker: Secondary | ICD-10-CM | POA: Diagnosis not present

## 2021-07-26 DIAGNOSIS — I5032 Chronic diastolic (congestive) heart failure: Secondary | ICD-10-CM | POA: Diagnosis not present

## 2021-07-26 DIAGNOSIS — M2550 Pain in unspecified joint: Secondary | ICD-10-CM | POA: Diagnosis not present

## 2021-07-26 DIAGNOSIS — F32A Depression, unspecified: Secondary | ICD-10-CM | POA: Diagnosis not present

## 2021-07-26 DIAGNOSIS — R29898 Other symptoms and signs involving the musculoskeletal system: Secondary | ICD-10-CM | POA: Diagnosis not present

## 2021-07-26 DIAGNOSIS — I120 Hypertensive chronic kidney disease with stage 5 chronic kidney disease or end stage renal disease: Secondary | ICD-10-CM | POA: Diagnosis not present

## 2021-07-26 DIAGNOSIS — D631 Anemia in chronic kidney disease: Secondary | ICD-10-CM | POA: Diagnosis not present

## 2021-07-26 DIAGNOSIS — R2689 Other abnormalities of gait and mobility: Secondary | ICD-10-CM | POA: Diagnosis not present

## 2021-07-26 DIAGNOSIS — I69351 Hemiplegia and hemiparesis following cerebral infarction affecting right dominant side: Secondary | ICD-10-CM | POA: Diagnosis not present

## 2021-07-26 DIAGNOSIS — K5730 Diverticulosis of large intestine without perforation or abscess without bleeding: Secondary | ICD-10-CM | POA: Diagnosis not present

## 2021-07-26 DIAGNOSIS — D464 Refractory anemia, unspecified: Secondary | ICD-10-CM | POA: Diagnosis not present

## 2021-07-26 DIAGNOSIS — B009 Herpesviral infection, unspecified: Secondary | ICD-10-CM | POA: Diagnosis not present

## 2021-07-26 DIAGNOSIS — Z7401 Bed confinement status: Secondary | ICD-10-CM | POA: Diagnosis not present

## 2021-07-26 DIAGNOSIS — M6281 Muscle weakness (generalized): Secondary | ICD-10-CM | POA: Diagnosis not present

## 2021-07-26 DIAGNOSIS — K5900 Constipation, unspecified: Secondary | ICD-10-CM | POA: Diagnosis not present

## 2021-07-26 DIAGNOSIS — R634 Abnormal weight loss: Secondary | ICD-10-CM | POA: Diagnosis not present

## 2021-07-26 DIAGNOSIS — N185 Chronic kidney disease, stage 5: Secondary | ICD-10-CM | POA: Diagnosis not present

## 2021-07-26 DIAGNOSIS — I69328 Other speech and language deficits following cerebral infarction: Secondary | ICD-10-CM | POA: Diagnosis not present

## 2021-07-26 DIAGNOSIS — N179 Acute kidney failure, unspecified: Secondary | ICD-10-CM | POA: Diagnosis not present

## 2021-07-26 DIAGNOSIS — Z9181 History of falling: Secondary | ICD-10-CM | POA: Diagnosis not present

## 2021-07-26 DIAGNOSIS — R894 Abnormal immunological findings in specimens from other organs, systems and tissues: Secondary | ICD-10-CM | POA: Diagnosis not present

## 2021-07-26 DIAGNOSIS — G459 Transient cerebral ischemic attack, unspecified: Secondary | ICD-10-CM | POA: Diagnosis not present

## 2021-07-26 DIAGNOSIS — Z1211 Encounter for screening for malignant neoplasm of colon: Secondary | ICD-10-CM | POA: Diagnosis not present

## 2021-07-26 DIAGNOSIS — R809 Proteinuria, unspecified: Secondary | ICD-10-CM | POA: Diagnosis not present

## 2021-07-26 DIAGNOSIS — R54 Age-related physical debility: Secondary | ICD-10-CM | POA: Diagnosis not present

## 2021-07-26 DIAGNOSIS — I4891 Unspecified atrial fibrillation: Secondary | ICD-10-CM | POA: Diagnosis not present

## 2021-07-26 DIAGNOSIS — R2681 Unsteadiness on feet: Secondary | ICD-10-CM | POA: Diagnosis not present

## 2021-07-26 DIAGNOSIS — K269 Duodenal ulcer, unspecified as acute or chronic, without hemorrhage or perforation: Secondary | ICD-10-CM | POA: Diagnosis not present

## 2021-07-26 DIAGNOSIS — N2581 Secondary hyperparathyroidism of renal origin: Secondary | ICD-10-CM | POA: Diagnosis not present

## 2021-07-26 DIAGNOSIS — N186 End stage renal disease: Secondary | ICD-10-CM | POA: Diagnosis not present

## 2021-07-26 DIAGNOSIS — Z5181 Encounter for therapeutic drug level monitoring: Secondary | ICD-10-CM | POA: Diagnosis not present

## 2021-07-26 DIAGNOSIS — D509 Iron deficiency anemia, unspecified: Secondary | ICD-10-CM | POA: Diagnosis not present

## 2021-07-26 DIAGNOSIS — U071 COVID-19: Secondary | ICD-10-CM | POA: Diagnosis not present

## 2021-07-26 DIAGNOSIS — M7989 Other specified soft tissue disorders: Secondary | ICD-10-CM | POA: Diagnosis not present

## 2021-07-26 DIAGNOSIS — Z940 Kidney transplant status: Secondary | ICD-10-CM | POA: Diagnosis not present

## 2021-07-26 DIAGNOSIS — I482 Chronic atrial fibrillation, unspecified: Secondary | ICD-10-CM | POA: Diagnosis not present

## 2021-07-26 DIAGNOSIS — R41841 Cognitive communication deficit: Secondary | ICD-10-CM | POA: Diagnosis not present

## 2021-07-26 DIAGNOSIS — D849 Immunodeficiency, unspecified: Secondary | ICD-10-CM | POA: Diagnosis not present

## 2021-07-26 DIAGNOSIS — Z7901 Long term (current) use of anticoagulants: Secondary | ICD-10-CM | POA: Diagnosis not present

## 2021-07-26 DIAGNOSIS — R531 Weakness: Secondary | ICD-10-CM | POA: Diagnosis not present

## 2021-07-26 DIAGNOSIS — I69828 Other speech and language deficits following other cerebrovascular disease: Secondary | ICD-10-CM | POA: Diagnosis not present

## 2021-07-26 DIAGNOSIS — R5381 Other malaise: Secondary | ICD-10-CM | POA: Diagnosis not present

## 2021-07-26 DIAGNOSIS — K922 Gastrointestinal hemorrhage, unspecified: Secondary | ICD-10-CM | POA: Diagnosis not present

## 2021-07-26 DIAGNOSIS — Z950 Presence of cardiac pacemaker: Secondary | ICD-10-CM | POA: Diagnosis not present

## 2021-07-26 DIAGNOSIS — I5042 Chronic combined systolic (congestive) and diastolic (congestive) heart failure: Secondary | ICD-10-CM | POA: Diagnosis not present

## 2021-07-26 DIAGNOSIS — K1230 Oral mucositis (ulcerative), unspecified: Secondary | ICD-10-CM | POA: Diagnosis not present

## 2021-07-26 DIAGNOSIS — E786 Lipoprotein deficiency: Secondary | ICD-10-CM | POA: Diagnosis not present

## 2021-07-26 DIAGNOSIS — I69359 Hemiplegia and hemiparesis following cerebral infarction affecting unspecified side: Secondary | ICD-10-CM | POA: Diagnosis not present

## 2021-07-26 DIAGNOSIS — L89152 Pressure ulcer of sacral region, stage 2: Secondary | ICD-10-CM | POA: Diagnosis not present

## 2021-07-26 DIAGNOSIS — Z515 Encounter for palliative care: Secondary | ICD-10-CM | POA: Diagnosis not present

## 2021-07-26 LAB — RENAL FUNCTION PANEL
Albumin: 2.1 g/dL — ABNORMAL LOW (ref 3.5–5.0)
Anion gap: 8 (ref 5–15)
BUN: 30 mg/dL — ABNORMAL HIGH (ref 8–23)
CO2: 18 mmol/L — ABNORMAL LOW (ref 22–32)
Calcium: 8.4 mg/dL — ABNORMAL LOW (ref 8.9–10.3)
Chloride: 113 mmol/L — ABNORMAL HIGH (ref 98–111)
Creatinine, Ser: 3.24 mg/dL — ABNORMAL HIGH (ref 0.44–1.00)
GFR, Estimated: 15 mL/min — ABNORMAL LOW (ref >60)
Glucose, Bld: 86 mg/dL (ref 70–99)
Phosphorus: 2.2 mg/dL — ABNORMAL LOW (ref 2.5–4.6)
Potassium: 4 mmol/L (ref 3.5–5.1)
Sodium: 139 mmol/L (ref 135–145)

## 2021-07-26 LAB — CBC
HCT: 28.2 % — ABNORMAL LOW (ref 36.0–46.0)
Hemoglobin: 8.6 g/dL — ABNORMAL LOW (ref 12.0–15.0)
MCH: 26.5 pg (ref 26.0–34.0)
MCHC: 30.5 g/dL (ref 30.0–36.0)
MCV: 87 fL (ref 80.0–100.0)
Platelets: 223 10*3/uL (ref 150–400)
RBC: 3.24 MIL/uL — ABNORMAL LOW (ref 3.87–5.11)
RDW: 19.6 % — ABNORMAL HIGH (ref 11.5–15.5)
WBC: 6.3 10*3/uL (ref 4.0–10.5)
nRBC: 0 % (ref 0.0–0.2)

## 2021-07-26 LAB — MAGNESIUM: Magnesium: 2.1 mg/dL (ref 1.7–2.4)

## 2021-07-26 MED ORDER — ATORVASTATIN CALCIUM 20 MG PO TABS: 20.0000 mg | ORAL_TABLET | Freq: Every day | ORAL | Status: DC

## 2021-07-26 MED ORDER — K-PHOS 500 MG PO TABS: 500.0000 mg | ORAL_TABLET | Freq: Three times a day (TID) | ORAL | 0 refills | Status: AC

## 2021-07-26 MED ORDER — DULOXETINE HCL 40 MG PO CPEP: 20.0000 mg | ORAL_CAPSULE | Freq: Every day | ORAL | Status: DC

## 2021-07-26 MED ORDER — APIXABAN 2.5 MG PO TABS: 2.5000 mg | ORAL_TABLET | Freq: Two times a day (BID) | ORAL | Status: DC

## 2021-07-26 NOTE — Plan of Care (Signed)
°  Problem: Health Behavior/Discharge Planning: Goal: Ability to manage health-related needs will improve Outcome: Adequate for Discharge   Problem: Clinical Measurements: Goal: Ability to maintain clinical measurements within normal limits will improve Outcome: Adequate for Discharge Goal: Will remain free from infection Outcome: Adequate for Discharge Goal: Diagnostic test results will improve Outcome: Adequate for Discharge Goal: Respiratory complications will improve Outcome: Adequate for Discharge Goal: Cardiovascular complication will be avoided Outcome: Adequate for Discharge   Problem: Activity: Goal: Risk for activity intolerance will decrease Outcome: Adequate for Discharge   Problem: Nutrition: Goal: Adequate nutrition will be maintained Outcome: Adequate for Discharge   Problem: Coping: Goal: Level of anxiety will decrease Outcome: Adequate for Discharge   Problem: Elimination: Goal: Will not experience complications related to bowel motility Outcome: Adequate for Discharge Goal: Will not experience complications related to urinary retention Outcome: Adequate for Discharge   Problem: Pain Managment: Goal: General experience of comfort will improve Outcome: Adequate for Discharge   Problem: Safety: Goal: Ability to remain free from injury will improve Outcome: Adequate for Discharge   Problem: Skin Integrity: Goal: Risk for impaired skin integrity will decrease Outcome: Adequate for Discharge   Problem: Education: Goal: Knowledge of disease or condition will improve Outcome: Adequate for Discharge Goal: Knowledge of secondary prevention will improve (SELECT ALL) Outcome: Adequate for Discharge Goal: Knowledge of patient specific risk factors will improve (INDIVIDUALIZE FOR PATIENT) Outcome: Adequate for Discharge Goal: Individualized Educational Video(s) Outcome: Adequate for Discharge   Problem: Health Behavior/Discharge Planning: Goal: Ability to  manage health-related needs will improve Outcome: Adequate for Discharge   Problem: Ischemic Stroke/TIA Tissue Perfusion: Goal: Complications of ischemic stroke/TIA will be minimized Outcome: Adequate for Discharge   Problem: Clinical Measurements: Goal: Ability to maintain clinical measurements within normal limits will improve Outcome: Adequate for Discharge   Problem: Ischemic Stroke/TIA Tissue Perfusion: Goal: Complications of ischemic stroke/TIA will be minimized Outcome: Adequate for Discharge

## 2021-07-26 NOTE — Progress Notes (Signed)
Subjective:  Denies SSCP, palpitations or Dyspnea  Objective:  Vitals:   07/25/21 1950 07/25/21 2346 07/26/21 0343 07/26/21 0726  BP: (!) 145/79 138/80 139/81 138/79  Pulse: 70 70 75 72  Resp: 16 17 17 14   Temp: 98.3 F (36.8 C) 98.5 F (36.9 C) 98.3 F (36.8 C) 98.2 F (36.8 C)  TempSrc: Oral Oral Oral Oral  SpO2: 98% 97% 98% 99%  Weight:      Height:        Intake/Output from previous day:  Intake/Output Summary (Last 24 hours) at 07/26/2021 0859 Last data filed at 07/26/2021 2458 Gross per 24 hour  Intake 660 ml  Output 450 ml  Net 210 ml    Physical Exam:  Frail black female Proptosis Left facial droop RUE/RLE weakness Post renal transplant RLQ Lungs clear  JVP elevated with TR murmur  No edema    Lab Results: Basic Metabolic Panel: Recent Labs    07/25/21 0620 07/26/21 0337  NA 140 139  K 4.1 4.0  CL 112* 113*  CO2 22 18*  GLUCOSE 98 86  BUN 31* 30*  CREATININE 3.12* 3.24*  CALCIUM 8.3* 8.4*  MG 1.7 2.1  PHOS 2.6 2.2*   Liver Function Tests: Recent Labs    07/23/21 1043 07/25/21 0620 07/26/21 0337  AST 47*  --   --   ALT 10  --   --   ALKPHOS 54  --   --   BILITOT 1.6*  --   --   PROT 4.6*  --   --   ALBUMIN 2.4* 2.3* 2.1*   No results for input(s): LIPASE, AMYLASE in the last 72 hours. CBC: Recent Labs    07/23/21 1043 07/23/21 1108 07/25/21 0620 07/26/21 0337  WBC 8.2   < > 6.7 6.3  NEUTROABS 6.6  --   --   --   HGB 9.5*   < > 9.3* 8.6*  HCT 32.0*   < > 31.2* 28.2*  MCV 89.4   < > 87.9 87.0  PLT 237   < > 226 223   < > = values in this interval not displayed.   Cardiac Enzymes: No results for input(s): CKTOTAL, CKMB, CKMBINDEX, TROPONINI in the last 72 hours. BNP: Invalid input(s): POCBNP D-Dimer: No results for input(s): DDIMER in the last 72 hours. Hemoglobin A1C: Recent Labs    07/23/21 1400  HGBA1C 4.7*   Fasting Lipid Panel: Recent Labs    07/24/21 0515  CHOL 124  HDL 34*  LDLCALC 71  TRIG 95   CHOLHDL 3.6   Thyroid Function Tests: Recent Labs    07/23/21 1322  TSH 1.765   Anemia Panel: Recent Labs    07/25/21 0620  VITAMINB12 1,274*  FOLATE 4.2*  FERRITIN 360*  TIBC 160*  IRON 94  RETICCTPCT 4.0*    Imaging: MR ANGIO HEAD WO CONTRAST  Result Date: 07/24/2021 CLINICAL DATA:  Neuro deficit, acute, stroke suspected EXAM: MRI HEAD WITHOUT CONTRAST MRA HEAD WITHOUT CONTRAST MRA NECK WITHOUT CONTRAST TECHNIQUE: Multiplanar, multiecho pulse sequences of the brain and surrounding structures were obtained without intravenous contrast. Angiographic images of the Circle of Willis were obtained using MRA technique without intravenous contrast. Angiographic images of the neck were obtained using MRA technique without intravenous contrast. Carotid stenosis measurements (when applicable) are obtained utilizing NASCET criteria, using the distal internal carotid diameter as the denominator. COMPARISON:  CT head 07/23/2021. FINDINGS: MRI HEAD FINDINGS Brain: Acute infarct in the left thalamus. Punctate  infarcts in the bilateral frontal lobes (series 4, image 35). Associated edema without mass effect. Additional patchy and confluent T2/FLAIR hyperintense signal within the supratentorial and pontine white matter, nonspecific but compatible with moderate chronic microvascular ischemic disease. Encephalomalacia in the anterior left temporal lobe and high left perirolandic cortex. Evidence of prior hemorrhage in the periventricular right frontal lobe with susceptibility artifact and T2 hypointensity. No evidence of acute hemorrhage. No hydrocephalus, mass lesion, midline shift, or extra-axial fluid collection Vascular: See below. Skull and upper cervical spine: Degenerative change at the craniocervical junction with pannus along the dorsal dens. No focal marrow replacing lesion. Sinuses/Orbits: Moderate mucosal thickening of the right maxillary sinus, anterior right ethmoid air cells, and right frontal  sinus. Other: Moderate right mastoid effusion. MRA HEAD FINDINGS Anterior circulation: Bilateral intracranial ICAs, MCAs, and ACAs are patent without proximal high-grade stenosis. Posterior circulation: Bilateral vertebral arteries, basilar artery and posterior cerebral arteries are patent without proximal high-grade stenosis. MRA NECK FINDINGS Great vessel origins are not imaged. Nondiagnostic evaluation of the proximal arteries in the lower neck. Visible common carotid arteries and internal carotid arteries are patent without evidence of significant (greater than 50%) stenosis. Visible vertebral arteries are patent with out evidence of significant (greater than 50%) stenosis. The vertebral arteries are codominant. IMPRESSION: MRI: 1. Acute infarct in the left thalamus. Punctate acute infarcts in the frontal lobes bilaterally. Given involvement of multiple vascular territories, consider a central embolic etiology. 2. Encephalomalacia in the anterior left temporal lobe and high left perirolandic cortex. 3. Moderate chronic microvascular ischemic disease. 4. Prior hemorrhage in the periventricular right frontal lobe. MRA head: No large vessel occlusion or proximal high-grade stenosis. MRA neck: No visible occlusion or significant stenosis with limitations described above. Electronically Signed   By: Margaretha Sheffield M.D.   On: 07/24/2021 15:16   MR ANGIO NECK WO CONTRAST  Result Date: 07/24/2021 CLINICAL DATA:  Neuro deficit, acute, stroke suspected EXAM: MRI HEAD WITHOUT CONTRAST MRA HEAD WITHOUT CONTRAST MRA NECK WITHOUT CONTRAST TECHNIQUE: Multiplanar, multiecho pulse sequences of the brain and surrounding structures were obtained without intravenous contrast. Angiographic images of the Circle of Willis were obtained using MRA technique without intravenous contrast. Angiographic images of the neck were obtained using MRA technique without intravenous contrast. Carotid stenosis measurements (when applicable)  are obtained utilizing NASCET criteria, using the distal internal carotid diameter as the denominator. COMPARISON:  CT head 07/23/2021. FINDINGS: MRI HEAD FINDINGS Brain: Acute infarct in the left thalamus. Punctate infarcts in the bilateral frontal lobes (series 4, image 35). Associated edema without mass effect. Additional patchy and confluent T2/FLAIR hyperintense signal within the supratentorial and pontine white matter, nonspecific but compatible with moderate chronic microvascular ischemic disease. Encephalomalacia in the anterior left temporal lobe and high left perirolandic cortex. Evidence of prior hemorrhage in the periventricular right frontal lobe with susceptibility artifact and T2 hypointensity. No evidence of acute hemorrhage. No hydrocephalus, mass lesion, midline shift, or extra-axial fluid collection Vascular: See below. Skull and upper cervical spine: Degenerative change at the craniocervical junction with pannus along the dorsal dens. No focal marrow replacing lesion. Sinuses/Orbits: Moderate mucosal thickening of the right maxillary sinus, anterior right ethmoid air cells, and right frontal sinus. Other: Moderate right mastoid effusion. MRA HEAD FINDINGS Anterior circulation: Bilateral intracranial ICAs, MCAs, and ACAs are patent without proximal high-grade stenosis. Posterior circulation: Bilateral vertebral arteries, basilar artery and posterior cerebral arteries are patent without proximal high-grade stenosis. MRA NECK FINDINGS Great vessel origins are not imaged. Nondiagnostic evaluation of  the proximal arteries in the lower neck. Visible common carotid arteries and internal carotid arteries are patent without evidence of significant (greater than 50%) stenosis. Visible vertebral arteries are patent with out evidence of significant (greater than 50%) stenosis. The vertebral arteries are codominant. IMPRESSION: MRI: 1. Acute infarct in the left thalamus. Punctate acute infarcts in the frontal  lobes bilaterally. Given involvement of multiple vascular territories, consider a central embolic etiology. 2. Encephalomalacia in the anterior left temporal lobe and high left perirolandic cortex. 3. Moderate chronic microvascular ischemic disease. 4. Prior hemorrhage in the periventricular right frontal lobe. MRA head: No large vessel occlusion or proximal high-grade stenosis. MRA neck: No visible occlusion or significant stenosis with limitations described above. Electronically Signed   By: Margaretha Sheffield M.D.   On: 07/24/2021 15:16   MR BRAIN WO CONTRAST  Result Date: 07/24/2021 CLINICAL DATA:  Neuro deficit, acute, stroke suspected EXAM: MRI HEAD WITHOUT CONTRAST MRA HEAD WITHOUT CONTRAST MRA NECK WITHOUT CONTRAST TECHNIQUE: Multiplanar, multiecho pulse sequences of the brain and surrounding structures were obtained without intravenous contrast. Angiographic images of the Circle of Willis were obtained using MRA technique without intravenous contrast. Angiographic images of the neck were obtained using MRA technique without intravenous contrast. Carotid stenosis measurements (when applicable) are obtained utilizing NASCET criteria, using the distal internal carotid diameter as the denominator. COMPARISON:  CT head 07/23/2021. FINDINGS: MRI HEAD FINDINGS Brain: Acute infarct in the left thalamus. Punctate infarcts in the bilateral frontal lobes (series 4, image 35). Associated edema without mass effect. Additional patchy and confluent T2/FLAIR hyperintense signal within the supratentorial and pontine white matter, nonspecific but compatible with moderate chronic microvascular ischemic disease. Encephalomalacia in the anterior left temporal lobe and high left perirolandic cortex. Evidence of prior hemorrhage in the periventricular right frontal lobe with susceptibility artifact and T2 hypointensity. No evidence of acute hemorrhage. No hydrocephalus, mass lesion, midline shift, or extra-axial fluid  collection Vascular: See below. Skull and upper cervical spine: Degenerative change at the craniocervical junction with pannus along the dorsal dens. No focal marrow replacing lesion. Sinuses/Orbits: Moderate mucosal thickening of the right maxillary sinus, anterior right ethmoid air cells, and right frontal sinus. Other: Moderate right mastoid effusion. MRA HEAD FINDINGS Anterior circulation: Bilateral intracranial ICAs, MCAs, and ACAs are patent without proximal high-grade stenosis. Posterior circulation: Bilateral vertebral arteries, basilar artery and posterior cerebral arteries are patent without proximal high-grade stenosis. MRA NECK FINDINGS Great vessel origins are not imaged. Nondiagnostic evaluation of the proximal arteries in the lower neck. Visible common carotid arteries and internal carotid arteries are patent without evidence of significant (greater than 50%) stenosis. Visible vertebral arteries are patent with out evidence of significant (greater than 50%) stenosis. The vertebral arteries are codominant. IMPRESSION: MRI: 1. Acute infarct in the left thalamus. Punctate acute infarcts in the frontal lobes bilaterally. Given involvement of multiple vascular territories, consider a central embolic etiology. 2. Encephalomalacia in the anterior left temporal lobe and high left perirolandic cortex. 3. Moderate chronic microvascular ischemic disease. 4. Prior hemorrhage in the periventricular right frontal lobe. MRA head: No large vessel occlusion or proximal high-grade stenosis. MRA neck: No visible occlusion or significant stenosis with limitations described above. Electronically Signed   By: Margaretha Sheffield M.D.   On: 07/24/2021 15:16   ECHOCARDIOGRAM COMPLETE  Result Date: 07/24/2021    ECHOCARDIOGRAM REPORT   Patient Name:   [name] Date of Exam: 07/24/2021 Medical Rec #:  [id number]     Height:  65.5 in Accession #:    [id number]    Weight:       123.2 lb Date of Birth:  08/02/1945    BSA:           1.619 m Patient Age:    3 years      BP:           151/81 mmHg Patient Gender: F             HR:           71 bpm. Exam Location:  Inpatient Procedure: 2D Echo Indications:    stroke  History:        Patient has prior history of Echocardiogram examinations, most                 recent 10/23/2019. Pacemaker, chronic kidney disease,                 Arrythmias:Atrial Fibrillation; Risk Factors:Sleep Apnea.  Sonographer:    Johny Chess RDCS Referring Phys: [place]  1. Left ventricular ejection fraction, by estimation, is 35 to 40%. The left ventricle has moderately decreased function. The left ventricle demonstrates regional wall motion abnormalities (see scoring diagram/findings for description). There is mild left ventricular hypertrophy. Left ventricular diastolic parameters are indeterminate.  2. Right ventricular systolic function is mildly reduced. The right ventricular size is mildly enlarged. There is severely elevated pulmonary artery systolic pressure. The estimated right ventricular systolic pressure is 61.6 mmHg.  3. Left atrial size was moderately dilated.  4. The mitral valve is normal in structure. Mild mitral valve regurgitation.  5. The tricuspid valve is abnormal. Tricuspid valve regurgitation is severe.  6. The aortic valve is tricuspid. Aortic valve regurgitation is mild. No aortic stenosis is present.  7. The inferior vena cava is dilated in size with >50% respiratory variability, suggesting right atrial pressure of 8 mmHg. FINDINGS  Left Ventricle: Left ventricular ejection fraction, by estimation, is 35 to 40%. The left ventricle has moderately decreased function. The left ventricle demonstrates regional wall motion abnormalities. The left ventricular internal cavity size was normal in size. There is mild left ventricular hypertrophy. Left ventricular diastolic parameters are indeterminate.  LV Wall Scoring: The entire septum and basal inferior segment  are akinetic. The apex is hypokinetic. The entire anterior wall, entire lateral wall, and mid and distal inferior wall are normal. Right Ventricle: The right ventricular size is mildly enlarged. Right vetricular wall thickness was not well visualized. Right ventricular systolic function is mildly reduced. There is severely elevated pulmonary artery systolic pressure. The tricuspid regurgitant velocity is 3.65 m/s, and with an assumed right atrial pressure of 8 mmHg, the estimated right ventricular systolic pressure is 07.3 mmHg. Left Atrium: Left atrial size was moderately dilated. Right Atrium: Right atrial size was normal in size. Pericardium: Trivial pericardial effusion is present. Mitral Valve: The mitral valve is normal in structure. Mild mitral valve regurgitation. Tricuspid Valve: The tricuspid valve is abnormal. Tricuspid valve regurgitation is severe. Aortic Valve: The aortic valve is tricuspid. Aortic valve regurgitation is mild. No aortic stenosis is present. Pulmonic Valve: The pulmonic valve was grossly normal. Pulmonic valve regurgitation is trivial. Aorta: The aortic root and ascending aorta are structurally normal, with no evidence of dilitation. Venous: The inferior vena cava is dilated in size with greater than 50% respiratory variability, suggesting right atrial pressure of 8 mmHg. IAS/Shunts: The interatrial septum was not well visualized.  LEFT VENTRICLE PLAX 2D LVIDd:  4.60 cm     Diastology LVIDs:         3.40 cm     LV e' medial:    5.87 cm/s LV PW:         1.00 cm     LV E/e' medial:  11.3 LV IVS:        1.10 cm     LV e' lateral:   8.16 cm/s LVOT diam:     2.00 cm     LV E/e' lateral: 8.1 LV SV:         44 LV SV Index:   27 LVOT Area:     3.14 cm  LV Volumes (MOD) LV vol d, MOD A2C: 63.1 ml LV vol d, MOD A4C: 73.5 ml LV vol s, MOD A2C: 37.3 ml LV vol s, MOD A4C: 51.8 ml LV SV MOD A2C:     25.8 ml LV SV MOD A4C:     73.5 ml LV SV MOD BP:      25.2 ml RIGHT VENTRICLE            IVC  RV S prime:     9.03 cm/s  IVC diam: 2.30 cm TAPSE (M-mode): 1.6 cm LEFT ATRIUM            Index        RIGHT ATRIUM           Index LA diam:      4.50 cm  2.78 cm/m   RA Area:     14.30 cm LA Vol (A2C): 71.4 ml  44.10 ml/m  RA Volume:   30.60 ml  18.90 ml/m LA Vol (A4C): 131.0 ml 80.90 ml/m  AORTIC VALVE LVOT Vmax:   85.90 cm/s LVOT Vmean:  53.400 cm/s LVOT VTI:    0.140 m  AORTA Ao Root diam: 3.10 cm Ao Asc diam:  3.20 cm MITRAL VALVE               TRICUSPID VALVE MV Area (PHT): 4.39 cm    TR Peak grad:   53.3 mmHg MV Decel Time: 173 msec    TR Vmax:        365.00 cm/s MV E velocity: 66.30 cm/s MV A velocity: 34.50 cm/s  SHUNTS MV E/A ratio:  1.92        Systemic VTI:  0.14 m                            Systemic Diam: 2.00 cm Oswaldo Milian MD Electronically signed by Oswaldo Milian MD Signature Date/Time: 07/24/2021/2:30:15 PM    Final     Cardiac Studies:  ECG: V pacing    Telemetry: V pacing   Echo:   Medications:    apixaban  2.5 mg Oral BID   atorvastatin  20 mg Oral Daily   carvedilol  6.25 mg Oral BID PC   DULoxetine  20 mg Oral Daily   fluticasone furoate-vilanterol  1 puff Inhalation Daily   mycophenolate  180 mg Oral BID   pantoprazole  40 mg Oral BID PC   predniSONE  5 mg Oral Daily   sodium bicarbonate  650 mg Oral BID   sodium chloride flush  3 mL Intravenous Q12H   sucralfate  1 g Oral QID   tacrolimus  1 mg Oral BID      sodium chloride      Assessment/Plan:  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 She has watchman device in with sealing of leak in 2020 Recent GI bleed with no overt source ? 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

## 2021-07-26 NOTE — Discharge Summary (Signed)
Physician Discharge Summary  [name] JSE:[id number] DOB: 1946-06-09 DOA: 07/23/2021  PCP: [name], NP  Admit date: 07/23/2021 Discharge date: 07/26/2021  Admitted From: Home Disposition:  SNF  Discharge Condition:Stable CODE STATUS:FULL Diet recommendation: renal diet  Brief/Interim Summary: 76 y.o. F with PMH of ESRD s/p renal transplant in 2017 now with CKD-5, ICH, A. Fib s/p ablation and LAA closure, AVB s/p PPM, DVT/PE s/p IVC filter, duodenal ulcer, diastolic CHF, asthma, OSA, depression and recent hospitalization for a BLA due to GI bleed with extensive work-up and blood transfusion.  She presented with acute onset of RLE weakness, slurred speech, dizziness and confusion, and admitted for suspected left-sided CVA.  CTH with age-indeterminate small vessel disease in left thalamus and possibly pounds and chronic right thalamic lacunar infarct.  Neurology consulted and recommended CVA work-up.  Patient is outside tPA window on arrival to ED.  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  Patient was not on anticoagulation due to GI bleed.  Neurology suggesting low-dose Eliquis if okay from GI standpoint.  Discussed with [facility], Dr. Benson Norway who is in agreement with low-dose Eliquis.  Eliquis resumed after risk and benefit discussion with family.  PT recommended SNF. Stable for discharge to skilled nursing facility today. The  pt is to be admitted to SNF without the 3 night qualified hospital stay under the current medicare waiver .  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  Patient with history of CVA with some residual right hemiparesis but worse on admission.  CTH, MRI brain and TTE as above.  Has history of DVT/PE with IVC  filter and A. Fib.  Was off anticoagulation due to GI bleed.    A1c 4.7 but could be falsely low given recent blood transfusion.  LDL 71.  Still with the above neuro deficit but improved. -Neurology recommended low-dose Eliquis.  GI and cardiology in agreement -Resumed Eliquis 2.5 mg twice daily after risk and benefit discussion with family.  H&H stable. -Check CBC in the morning -Follow PT/OT/SLP recommendations-recommended SNF.  Family in agreement.     History of acute blood loss anemia due to diverticular bleed-extensive work-up last months.  Erosive esophagitis/esophageal ulceration/duodenal ulceration: POA -Protonix 40 mg twice daily for 1 month followed by 40 mg daily -Continue Carafate ACHS -Check CBC in 3 days now we resumed Eliquis -f/u with GI outpatient    CKD-5/azotemia: Cr better than baseline.  Azotemia improved. -Continue monitoring   History of renal transplant in 2017 -Continue home Prograf, prednisone and Myfortic.   Chronic combined CHF/DCM: TTE with LVEF of 35 to 40%, R WMA, indeterminate DD, severe TVR, RVSP of 61.3 mmHg.  Significant change from previous TTE in 10/2019, although she had an LVEF of 25 to 30%, moderate TVR and RVSP of 58 mmHg in 2019.  Appears euvolemic on exam.  No cardiopulmonary symptoms. Not a candidate for catheterization due to advanced CKD.  Not on diuretics at home. -Continue low-dose Coreg -CKD precludes ARNI/ARB/ACEI   Essential hypertension: Normotensive. -Continue Coreg.   Atrial fibrillation s/p AVN ablation/PPM implant, s/p LAAC with Watchman-POA -Continue telemetry -Back on Eliquis 2.5 mg twice daily   Asthmatic bronchitis without complication: Stable. -Stable, continue bronchodilators   Depression: Stable. -Continue her cymbalta 20mg     Obstructive sleep apnea not  compliant with CPAP -Declined CPAP here.   RLE edema: LE [name] negative for DVT.  Likely due to chronic DVT/IVC filter.   Goal of care : Remains full code.   Extensive discussion on 1/9 -Continue full code -Palliative medicine consulted, recommended outpatient follow-up    Discharge Diagnoses:  Principal Problem:   Right leg weakness Active Problems:   Atrial fibrillation s/p AVN ablation/PPM implant, s/p LAAC with Watchman   Asthmatic bronchitis without complication   History of CVA with residual deficit   Acute blood loss anemia secondary to diverticular bleed    Personal history of DVT/PE s/p IVC filter   Obstructive sleep apnea   History of kidney transplant 2017   Hypertension   CKD (chronic kidney disease), stage V (HCC)   Depression   Pacemaker   Anticoagulation management encounter   Chronic combined systolic and diastolic CHF (congestive heart failure) (HCC)   Erosive esophagitis/esophageal ulceration/duodenal ulceration   Edema of right lower leg   Acute CVA (cerebrovascular accident) [facility])    Discharge Instructions  Discharge Instructions     Ambulatory referral to Neurology   Complete by: As directed    Follow up with stroke clinic NP (Jessica Vanschaick or Cecille Rubin, if both not available, consider Zachery Dauer, or Ahern) at [facility] in about 4 weeks. Thanks.   Diet - low sodium heart healthy   Complete by: As directed    Renal diet   Discharge instructions   Complete by: As directed    1)Follow up with [facility] neurology in 4 weeks.  Name and number of the provider group has been attached 2) take prescribed medications as instructed.  Do a CBC and phosphorus test in 3 days 3) follow-up with gastroenterology as an outpatient. 4)Follow up with outpatient palliative care   Increase activity slowly   Complete by: As directed    No wound care   Complete by: As directed       Allergies as of 07/26/2021       Reactions   Warfarin Sodium Other (See Comments)   Caused her to have a stroke/left side of brain to hemorrhage    Ace Inhibitors Cough   Amiodarone Other (See Comments)   Pt with Restrictive Lung  Disease by 10/2017 PFT with decreased DLCO   Amlodipine Swelling   Swelling in feet        Medication List     TAKE these medications    acetaminophen 500 MG tablet Commonly known as: TYLENOL Take 500 mg by mouth every 6 (six) hours as needed for mild pain or headache.   albuterol (2.5 MG/3ML) 0.083% nebulizer solution Commonly known as: PROVENTIL Take 3 mLs (2.5 mg total) by nebulization every 6 (six) hours as needed for wheezing or shortness of breath. Dx:J45.909   albuterol 108 (90 Base) MCG/ACT inhaler Commonly known as: VENTOLIN HFA Inhale 1-2 puffs into the lungs every 6 (six) hours as needed for wheezing or shortness of breath.   apixaban 2.5 MG Tabs tablet Commonly known as: ELIQUIS Take 1 tablet (2.5 mg total) by mouth 2 (two) times daily.   atorvastatin 20 MG tablet Commonly known as: LIPITOR Take 1 tablet (20 mg total) by mouth daily. Start taking on: July 27, 2021   budesonide-formoterol 160-4.5 MCG/ACT inhaler Commonly known as: SYMBICORT Inhale 2 puffs into the lungs 2 (two) times daily.   carvedilol 6.25 MG tablet Commonly known as: COREG Take 6.25 mg by mouth 2 (two) times daily.   docusate sodium 100  MG capsule Commonly known as: COLACE Take 100 mg by mouth at bedtime as needed for mild constipation.   DULoxetine HCl 40 MG Cpep Take 20 mg by mouth daily. TAKE 1 CAPSULE BY MOUTH EVERY DAY   K-Phos 500 MG tablet Generic drug: potassium phosphate (monobasic) Take 1 tablet (500 mg total) by mouth 3 (three) times daily with meals for 2 days.   mycophenolate 180 MG EC tablet Commonly known as: MYFORTIC Take 180 mg by mouth 2 (two) times daily.   ondansetron 4 MG tablet Commonly known as: ZOFRAN Take 1 tablet (4 mg total) by mouth every 8 (eight) hours as needed for up to 9 doses for nausea.   pantoprazole 40 MG tablet Commonly known as: PROTONIX Take 1 tablet (40 mg total) by mouth 2 (two) times daily for 30 days, THEN 1 tablet (40 mg total)  daily. Start taking on: July 13, 2021   predniSONE 5 MG tablet Commonly known as: DELTASONE Take 5 mg by mouth daily.   sodium bicarbonate 650 MG tablet Take 1 tablet (650 mg total) by mouth 2 (two) times daily.   sucralfate 1 g tablet Commonly known as: Carafate Take 1 tablet (1 g total) by mouth 4 (four) times daily.   tacrolimus 1 MG capsule Commonly known as: PROGRAF Take 1 mg by mouth 2 (two) times daily.        Follow-up Information     [facility]. Schedule an appointment as soon as possible for a visit in 1 month(s).   Specialty: Neurology Why: stroke clinic Contact information: [place] [phone]               Allergies  Allergen Reactions   Warfarin Sodium Other (See Comments)    Caused her to have a stroke/left side of brain to hemorrhage    Ace Inhibitors Cough   Amiodarone Other (See Comments)    Pt with Restrictive Lung Disease by 10/2017 PFT with decreased DLCO   Amlodipine Swelling    Swelling in feet    Consultations: Neurology   Procedures/Studies: DG Chest 2 View  Result Date: 07/01/2021 CLINICAL DATA:  Shortness of breath.  Transplant patient. EXAM: CHEST - 2 VIEW COMPARISON:  June 14, 2021 FINDINGS: Cardiomegaly. The hila and mediastinum are unchanged. No pneumothorax. No nodules or masses. Possible mild opacity in left base on the frontal view. Stable pacemaker. No other acute abnormalities. IMPRESSION: Possible mild opacity in the left base may represent atelectasis, confluence of shadows, or developing infiltrate. Recommend short-term follow-up imaging to ensure resolution. Electronically Signed   By: Dorise Bullion III M.D.   On: 07/01/2021 21:14   MR ANGIO HEAD WO CONTRAST  Result Date: 07/24/2021 CLINICAL DATA:  Neuro deficit, acute, stroke suspected EXAM: MRI HEAD WITHOUT CONTRAST MRA HEAD WITHOUT CONTRAST MRA NECK WITHOUT CONTRAST TECHNIQUE:  Multiplanar, multiecho pulse sequences of the brain and surrounding structures were obtained without intravenous contrast. Angiographic images of the Circle of Willis were obtained using MRA technique without intravenous contrast. Angiographic images of the neck were obtained using MRA technique without intravenous contrast. Carotid stenosis measurements (when applicable) are obtained utilizing NASCET criteria, using the distal internal carotid diameter as the denominator. COMPARISON:  CT head 07/23/2021. FINDINGS: MRI HEAD FINDINGS Brain: Acute infarct in the left thalamus. Punctate infarcts in the bilateral frontal lobes (series 4, image 35). Associated edema without mass effect. Additional patchy and confluent T2/FLAIR hyperintense signal within the supratentorial and pontine white  matter, nonspecific but compatible with moderate chronic microvascular ischemic disease. Encephalomalacia in the anterior left temporal lobe and high left perirolandic cortex. Evidence of prior hemorrhage in the periventricular right frontal lobe with susceptibility artifact and T2 hypointensity. No evidence of acute hemorrhage. No hydrocephalus, mass lesion, midline shift, or extra-axial fluid collection Vascular: See below. Skull and upper cervical spine: Degenerative change at the craniocervical junction with pannus along the dorsal dens. No focal marrow replacing lesion. Sinuses/Orbits: Moderate mucosal thickening of the right maxillary sinus, anterior right ethmoid air cells, and right frontal sinus. Other: Moderate right mastoid effusion. MRA HEAD FINDINGS Anterior circulation: Bilateral intracranial ICAs, MCAs, and ACAs are patent without proximal high-grade stenosis. Posterior circulation: Bilateral vertebral arteries, basilar artery and posterior cerebral arteries are patent without proximal high-grade stenosis. MRA NECK FINDINGS Great vessel origins are not imaged. Nondiagnostic evaluation of the proximal arteries in the  lower neck. Visible common carotid arteries and internal carotid arteries are patent without evidence of significant (greater than 50%) stenosis. Visible vertebral arteries are patent with out evidence of significant (greater than 50%) stenosis. The vertebral arteries are codominant. IMPRESSION: MRI: 1. Acute infarct in the left thalamus. Punctate acute infarcts in the frontal lobes bilaterally. Given involvement of multiple vascular territories, consider a central embolic etiology. 2. Encephalomalacia in the anterior left temporal lobe and high left perirolandic cortex. 3. Moderate chronic microvascular ischemic disease. 4. Prior hemorrhage in the periventricular right frontal lobe. MRA head: No large vessel occlusion or proximal high-grade stenosis. MRA neck: No visible occlusion or significant stenosis with limitations described above. Electronically Signed   By: Margaretha Sheffield M.D.   On: 07/24/2021 15:16   MR ANGIO NECK WO CONTRAST  Result Date: 07/24/2021 CLINICAL DATA:  Neuro deficit, acute, stroke suspected EXAM: MRI HEAD WITHOUT CONTRAST MRA HEAD WITHOUT CONTRAST MRA NECK WITHOUT CONTRAST TECHNIQUE: Multiplanar, multiecho pulse sequences of the brain and surrounding structures were obtained without intravenous contrast. Angiographic images of the Circle of Willis were obtained using MRA technique without intravenous contrast. Angiographic images of the neck were obtained using MRA technique without intravenous contrast. Carotid stenosis measurements (when applicable) are obtained utilizing NASCET criteria, using the distal internal carotid diameter as the denominator. COMPARISON:  CT head 07/23/2021. FINDINGS: MRI HEAD FINDINGS Brain: Acute infarct in the left thalamus. Punctate infarcts in the bilateral frontal lobes (series 4, image 35). Associated edema without mass effect. Additional patchy and confluent T2/FLAIR hyperintense signal within the supratentorial and pontine white matter, nonspecific  but compatible with moderate chronic microvascular ischemic disease. Encephalomalacia in the anterior left temporal lobe and high left perirolandic cortex. Evidence of prior hemorrhage in the periventricular right frontal lobe with susceptibility artifact and T2 hypointensity. No evidence of acute hemorrhage. No hydrocephalus, mass lesion, midline shift, or extra-axial fluid collection Vascular: See below. Skull and upper cervical spine: Degenerative change at the craniocervical junction with pannus along the dorsal dens. No focal marrow replacing lesion. Sinuses/Orbits: Moderate mucosal thickening of the right maxillary sinus, anterior right ethmoid air cells, and right frontal sinus. Other: Moderate right mastoid effusion. MRA HEAD FINDINGS Anterior circulation: Bilateral intracranial ICAs, MCAs, and ACAs are patent without proximal high-grade stenosis. Posterior circulation: Bilateral vertebral arteries, basilar artery and posterior cerebral arteries are patent without proximal high-grade stenosis. MRA NECK FINDINGS Great vessel origins are not imaged. Nondiagnostic evaluation of the proximal arteries in the lower neck. Visible common carotid arteries and internal carotid arteries are patent without evidence of significant (greater than 50%) stenosis. Visible vertebral arteries  are patent with out evidence of significant (greater than 50%) stenosis. The vertebral arteries are codominant. IMPRESSION: MRI: 1. Acute infarct in the left thalamus. Punctate acute infarcts in the frontal lobes bilaterally. Given involvement of multiple vascular territories, consider a central embolic etiology. 2. Encephalomalacia in the anterior left temporal lobe and high left perirolandic cortex. 3. Moderate chronic microvascular ischemic disease. 4. Prior hemorrhage in the periventricular right frontal lobe. MRA head: No large vessel occlusion or proximal high-grade stenosis. MRA neck: No visible occlusion or significant stenosis  with limitations described above. Electronically Signed   By: Margaretha Sheffield M.D.   On: 07/24/2021 15:16   MR BRAIN WO CONTRAST  Result Date: 07/24/2021 CLINICAL DATA:  Neuro deficit, acute, stroke suspected EXAM: MRI HEAD WITHOUT CONTRAST MRA HEAD WITHOUT CONTRAST MRA NECK WITHOUT CONTRAST TECHNIQUE: Multiplanar, multiecho pulse sequences of the brain and surrounding structures were obtained without intravenous contrast. Angiographic images of the Circle of Willis were obtained using MRA technique without intravenous contrast. Angiographic images of the neck were obtained using MRA technique without intravenous contrast. Carotid stenosis measurements (when applicable) are obtained utilizing NASCET criteria, using the distal internal carotid diameter as the denominator. COMPARISON:  CT head 07/23/2021. FINDINGS: MRI HEAD FINDINGS Brain: Acute infarct in the left thalamus. Punctate infarcts in the bilateral frontal lobes (series 4, image 35). Associated edema without mass effect. Additional patchy and confluent T2/FLAIR hyperintense signal within the supratentorial and pontine white matter, nonspecific but compatible with moderate chronic microvascular ischemic disease. Encephalomalacia in the anterior left temporal lobe and high left perirolandic cortex. Evidence of prior hemorrhage in the periventricular right frontal lobe with susceptibility artifact and T2 hypointensity. No evidence of acute hemorrhage. No hydrocephalus, mass lesion, midline shift, or extra-axial fluid collection Vascular: See below. Skull and upper cervical spine: Degenerative change at the craniocervical junction with pannus along the dorsal dens. No focal marrow replacing lesion. Sinuses/Orbits: Moderate mucosal thickening of the right maxillary sinus, anterior right ethmoid air cells, and right frontal sinus. Other: Moderate right mastoid effusion. MRA HEAD FINDINGS Anterior circulation: Bilateral intracranial ICAs, MCAs, and ACAs are  patent without proximal high-grade stenosis. Posterior circulation: Bilateral vertebral arteries, basilar artery and posterior cerebral arteries are patent without proximal high-grade stenosis. MRA NECK FINDINGS Great vessel origins are not imaged. Nondiagnostic evaluation of the proximal arteries in the lower neck. Visible common carotid arteries and internal carotid arteries are patent without evidence of significant (greater than 50%) stenosis. Visible vertebral arteries are patent with out evidence of significant (greater than 50%) stenosis. The vertebral arteries are codominant. IMPRESSION: MRI: 1. Acute infarct in the left thalamus. Punctate acute infarcts in the frontal lobes bilaterally. Given involvement of multiple vascular territories, consider a central embolic etiology. 2. Encephalomalacia in the anterior left temporal lobe and high left perirolandic cortex. 3. Moderate chronic microvascular ischemic disease. 4. Prior hemorrhage in the periventricular right frontal lobe. MRA head: No large vessel occlusion or proximal high-grade stenosis. MRA neck: No visible occlusion or significant stenosis with limitations described above. Electronically Signed   By: Margaretha Sheffield M.D.   On: 07/24/2021 15:16   US Renal Transplant w/Doppler  Result Date: 07/10/2021 CLINICAL DATA:  Acute renal failure.  History of kidney transplant. EXAM: ULTRASOUND OF RENAL TRANSPLANT WITH RENAL DOPPLER ULTRASOUND TECHNIQUE: Ultrasound examination of the renal transplant was performed with gray-scale, color and duplex doppler evaluation. COMPARISON:  03/23/2021 FINDINGS: Transplant kidney location: Right lower quadrant Transplant Kidney: Renal measurements: 8.4 x 4.4 x 4.7 cm = volume:  90.20mL. Normal in size and parenchymal echogenicity. No evidence of mass or hydronephrosis. No peri-transplant fluid collection seen. Color flow in the main renal artery:  Yes Color flow in the main renal vein:  Yes Duplex Doppler Evaluation:  Main Renal Artery Resistive Index: 0.7 Venous waveform in main renal vein:  present/absent Intrarenal resistive index in upper pole:  0.7 (normal 0.6-0.8; equivocal 0.8-0.9; abnormal >= 0.9) Intrarenal resistive index in lower pole: 0.7 (normal 0.6-0.8; equivocal 0.8-0.9; abnormal >= 0.9) Bladder: Normal for degree of bladder distention. Ureteral jets identified on color Doppler imaging. Other findings:  None. IMPRESSION: Stable, unremarkable appearance of the right lower quadrant renal transplant. Electronically Signed   By: Misty Stanley M.D.   On: 07/10/2021 10:57   ECHOCARDIOGRAM COMPLETE  Result Date: 07/24/2021    ECHOCARDIOGRAM REPORT   Patient Name:   NANCEY KREITZ Date of Exam: 07/24/2021 Medical Rec #:  [id number]     Height:       65.5 in Accession #:    [id number]    Weight:       123.2 lb Date of Birth:  05-09-1946    BSA:          1.619 m Patient Age:    66 years      BP:           151/81 mmHg Patient Gender: F             HR:           71 bpm. Exam Location:  Inpatient Procedure: 2D Echo Indications:    stroke  History:        Patient has prior history of Echocardiogram examinations, most                 recent 10/23/2019. Pacemaker, chronic kidney disease,                 Arrythmias:Atrial Fibrillation; Risk Factors:Sleep Apnea.  Sonographer:    Johny Chess RDCS Referring Phys: [place]  1. Left ventricular ejection fraction, by estimation, is 35 to 40%. The left ventricle has moderately decreased function. The left ventricle demonstrates regional wall motion abnormalities (see scoring diagram/findings for description). There is mild left ventricular hypertrophy. Left ventricular diastolic parameters are indeterminate.  2. Right ventricular systolic function is mildly reduced. The right ventricular size is mildly enlarged. There is severely elevated pulmonary artery systolic pressure. The estimated right ventricular systolic pressure is 71.6 mmHg.  3. Left atrial  size was moderately dilated.  4. The mitral valve is normal in structure. Mild mitral valve regurgitation.  5. The tricuspid valve is abnormal. Tricuspid valve regurgitation is severe.  6. The aortic valve is tricuspid. Aortic valve regurgitation is mild. No aortic stenosis is present.  7. The inferior vena cava is dilated in size with >50% respiratory variability, suggesting right atrial pressure of 8 mmHg. FINDINGS  Left Ventricle: Left ventricular ejection fraction, by estimation, is 35 to 40%. The left ventricle has moderately decreased function. The left ventricle demonstrates regional wall motion abnormalities. The left ventricular internal cavity size was normal in size. There is mild left ventricular hypertrophy. Left ventricular diastolic parameters are indeterminate.  LV Wall Scoring: The entire septum and basal inferior segment are akinetic. The apex is hypokinetic. The entire anterior wall, entire lateral wall, and mid and distal inferior wall are normal. Right Ventricle: The right ventricular size is mildly enlarged. Right vetricular wall thickness was not well visualized. Right ventricular systolic  function is mildly reduced. There is severely elevated pulmonary artery systolic pressure. The tricuspid regurgitant velocity is 3.65 m/s, and with an assumed right atrial pressure of 8 mmHg, the estimated right ventricular systolic pressure is 12.4 mmHg. Left Atrium: Left atrial size was moderately dilated. Right Atrium: Right atrial size was normal in size. Pericardium: Trivial pericardial effusion is present. Mitral Valve: The mitral valve is normal in structure. Mild mitral valve regurgitation. Tricuspid Valve: The tricuspid valve is abnormal. Tricuspid valve regurgitation is severe. Aortic Valve: The aortic valve is tricuspid. Aortic valve regurgitation is mild. No aortic stenosis is present. Pulmonic Valve: The pulmonic valve was grossly normal. Pulmonic valve regurgitation is trivial. Aorta: The  aortic root and ascending aorta are structurally normal, with no evidence of dilitation. Venous: The inferior vena cava is dilated in size with greater than 50% respiratory variability, suggesting right atrial pressure of 8 mmHg. IAS/Shunts: The interatrial septum was not well visualized.  LEFT VENTRICLE PLAX 2D LVIDd:         4.60 cm     Diastology LVIDs:         3.40 cm     LV e' medial:    5.87 cm/s LV PW:         1.00 cm     LV E/e' medial:  11.3 LV IVS:        1.10 cm     LV e' lateral:   8.16 cm/s LVOT diam:     2.00 cm     LV E/e' lateral: 8.1 LV SV:         44 LV SV Index:   27 LVOT Area:     3.14 cm  LV Volumes (MOD) LV vol d, MOD A2C: 63.1 ml LV vol d, MOD A4C: 73.5 ml LV vol s, MOD A2C: 37.3 ml LV vol s, MOD A4C: 51.8 ml LV SV MOD A2C:     25.8 ml LV SV MOD A4C:     73.5 ml LV SV MOD BP:      25.2 ml RIGHT VENTRICLE            IVC RV S prime:     9.03 cm/s  IVC diam: 2.30 cm TAPSE (M-mode): 1.6 cm LEFT ATRIUM            Index        RIGHT ATRIUM           Index LA diam:      4.50 cm  2.78 cm/m   RA Area:     14.30 cm LA Vol (A2C): 71.4 ml  44.10 ml/m  RA Volume:   30.60 ml  18.90 ml/m LA Vol (A4C): 131.0 ml 80.90 ml/m  AORTIC VALVE LVOT Vmax:   85.90 cm/s LVOT Vmean:  53.400 cm/s LVOT VTI:    0.140 m  AORTA Ao Root diam: 3.10 cm Ao Asc diam:  3.20 cm MITRAL VALVE               TRICUSPID VALVE MV Area (PHT): 4.39 cm    TR Peak grad:   53.3 mmHg MV Decel Time: 173 msec    TR Vmax:        365.00 cm/s MV E velocity: 66.30 cm/s MV A velocity: 34.50 cm/s  SHUNTS MV E/A ratio:  1.92        Systemic VTI:  0.14 m  Systemic Diam: 2.00 cm Oswaldo Milian MD Electronically signed by Oswaldo Milian MD Signature Date/Time: 07/24/2021/2:30:15 PM    Final    CT HEAD CODE STROKE WO CONTRAST  Result Date: 07/23/2021 CLINICAL DATA:  Code stroke. 76 year old female with left side weakness. EXAM: CT HEAD WITHOUT CONTRAST TECHNIQUE: Contiguous axial images were obtained from the  base of the skull through the vertex without intravenous contrast. COMPARISON:  Head CT without contrast 09/12/2011. FINDINGS: Brain: Generalized cerebral volume loss since 2013. No ventriculomegaly. No midline shift, mass effect, or evidence of intracranial mass lesion. Chronic appearing lacunar infarct right thalamus. Larger more indistinct hypodense area in the left medial thalamus. Confluent bilateral cerebral white matter hypodensity. Encephalomalacia in the anterior inferior left temporal lobe. And small areas of chronic appearing cortical encephalomalacia posterosuperior left frontal gyrus. Vague asymmetric hypodensity also in the left pons. No acute intracranial hemorrhage identified. No acute cortically based infarct identified. Vascular: Calcified atherosclerosis at the skull base. Skull: No acute osseous abnormality identified. Sinuses/Orbits: Mild right paranasal sinus mucosal thickening. New opacified right tympanic cavity and right mastoid air cells. Other: Visualized orbits and scalp soft tissues are within normal limits. ASPECTS [facility] Stroke Program Early CT Score) Total score (0-10 with 10 being normal): 10 IMPRESSION: 1. Advanced chronic ischemic disease, including age indeterminate small vessel disease in the left thalamus and possibly also pons. A right thalamic lacune has a more chronic appearance. 2. No acute cortically based infarct or acute intracranial hemorrhage identified. ASPECTS 10. 3. These results were communicated to Dr. Quinn Axe at 10:55 am on 07/23/2021 by text page via the [facility] messaging system. Electronically Signed   By: Genevie Ann M.D.   On: 07/23/2021 11:00   VAS [name] LOWER EXTREMITY VENOUS (DVT) (7a-7p)  Result Date: 07/24/2021  Lower Venous DVT Study Patient Name:  [name]  Date of Exam:   07/23/2021 Medical Rec #: [id number]      Accession #:    [id number] Date of Birth: 11/18/1945     Patient Gender: F Patient Age:   43 years Exam Location:  [facility] Procedure:       VAS [name] LOWER EXTREMITY VENOUS (DVT) Referring Phys: MATTHEW TRIFAN --------------------------------------------------------------------------------  Indications: Right leg swelling.  Limitations: Size and depth of vessels. Comparison Study: 12-21-2017 Bilateral lower extremity venous was negative for                   acute DVT. 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 Bilateral testing is considered an integral part of a complete examination. Limited examinations for reoccurring indications may be performed as noted. The reflux portion of the exam is performed with the patient in reverse Trendelenburg.  +---------+---------------+---------+-----------+----------+-------------------+  RIGHT     Compressibility Phasicity Spontaneity Properties Thrombus Aging       +---------+---------------+---------+-----------+----------+-------------------+  CFV       Full            Yes       Yes                                         +---------+---------------+---------+-----------+----------+-------------------+  SFJ       Full                                                                  +---------+---------------+---------+-----------+----------+-------------------+  FV Prox   Full                                                                  +---------+---------------+---------+-----------+----------+-------------------+  FV Mid    Full                                                                  +---------+---------------+---------+-----------+----------+-------------------+  FV Distal Full                                                                   +---------+---------------+---------+-----------+----------+-------------------+  PFV       Full                                                                  +---------+---------------+---------+-----------+----------+-------------------+  POP       Full            Yes       Yes                                         +---------+---------------+---------+-----------+----------+-------------------+  PTV       Full                                                                  +---------+---------------+---------+-----------+----------+-------------------+  PERO      Full                                             Not well visualized  +---------+---------------+---------+-----------+----------+-------------------+  Gastroc   Full                                                                  +---------+---------------+---------+-----------+----------+-------------------+   +----+---------------+---------+-----------+----------+--------------+  LEFT Compressibility Phasicity Spontaneity Properties Thrombus Aging  +----+---------------+---------+-----------+----------+--------------+  CFV  Full            Yes       Yes                                    +----+---------------+---------+-----------+----------+--------------+     Summary: RIGHT: - There is no evidence of deep vein thrombosis in the lower extremity.  - No cystic structure found in the popliteal fossa.  LEFT: - No evidence of common femoral vein obstruction.  *See table(s) above for measurements and observations. Electronically signed by Servando Snare MD on 07/24/2021 at 6:05:05 PM.    Final       Subjective: Patient seen and examined at the bedside this morning.  Hemodynamically stable for discharge.I called and discussed with husband on phone today about dc planning  Discharge Exam: Vitals:   07/26/21 0343 07/26/21 0726  BP: 139/81 138/79  Pulse: 75 72  Resp: 17 14  Temp: 98.3 F (36.8 C) 98.2 F (36.8 C)  SpO2: 98% 99%   Vitals:    07/25/21 1950 07/25/21 2346 07/26/21 0343 07/26/21 0726  BP: (!) 145/79 138/80 139/81 138/79  Pulse: 70 70 75 72  Resp: 16 17 17 14   Temp: 98.3 F (36.8 C) 98.5 F (36.9 C) 98.3 F (36.8 C) 98.2 F (36.8 C)  TempSrc: Oral Oral Oral Oral  SpO2: 98% 97% 98% 99%  Weight:      Height:        General: Pt is alert, awake, not in acute distress Cardiovascular: RRR, S1/S2 +, no rubs, no gallops Respiratory: CTA bilaterally, no wheezing, no rhonchi Abdominal: Soft, NT, ND, bowel sounds + Extremities: no edema, no cyanosis, weakness on the right side    The results of significant diagnostics from this hospitalization (including imaging, microbiology, ancillary and laboratory) are listed below for reference.     Microbiology: Recent Results (from the past 240 hour(s))  Resp Panel by RT-PCR (Flu A&B, Covid) Nasopharyngeal Swab     Status: None   Collection Time: 07/23/21 11:12 AM   Specimen: Nasopharyngeal Swab; Nasopharyngeal(NP) swabs in vial transport medium  Result Value Ref Range Status   SARS Coronavirus 2 by RT PCR NEGATIVE NEGATIVE Final    Comment: (NOTE) SARS-CoV-2 target nucleic acids are NOT DETECTED.  The SARS-CoV-2 RNA is generally detectable in upper respiratory specimens during the acute phase of infection. The lowest concentration of SARS-CoV-2 viral copies this assay can detect is 138 copies/mL. A negative result does not preclude SARS-Cov-2 infection and should not be used as the sole basis for treatment or other patient management decisions. A negative result may occur with  improper specimen collection/handling, submission of specimen other than nasopharyngeal swab, presence of viral mutation(s) within the areas targeted by this assay, and inadequate number of viral copies(<138 copies/mL). A negative result must be combined with clinical observations, patient history, and epidemiological information. The expected result is Negative.  Fact Sheet for Patients:   EntrepreneurPulse.com.au  Fact Sheet for Healthcare Providers:  IncredibleEmployment.be  This test is no t yet approved or cleared by the [place] FDA and  has been authorized for detection and/or diagnosis of SARS-CoV-2 by FDA under an Emergency Use Authorization (EUA). This EUA will remain  in effect (meaning this test can be used) for the duration of the COVID-19 declaration under Section 564(b)(1) of the Act, 21  U.S.C.section 360bbb-3(b)(1), unless the authorization is terminated  or revoked sooner.       Influenza A by PCR NEGATIVE NEGATIVE Final   Influenza B by PCR NEGATIVE NEGATIVE Final    Comment: (NOTE) The Xpert Xpress SARS-CoV-2/FLU/RSV plus assay is intended as an aid in the diagnosis of influenza from Nasopharyngeal swab specimens and should not be used as a sole basis for treatment. Nasal washings and aspirates are unacceptable for Xpert Xpress SARS-CoV-2/FLU/RSV testing.  Fact Sheet for Patients: EntrepreneurPulse.com.au  Fact Sheet for Healthcare Providers: IncredibleEmployment.be  This test is not yet approved or cleared by the [place] FDA and has been authorized for detection and/or diagnosis of SARS-CoV-2 by FDA under an Emergency Use Authorization (EUA). This EUA will remain in effect (meaning this test can be used) for the duration of the COVID-19 declaration under Section 564(b)(1) of the Act, 21 U.S.C. section 360bbb-3(b)(1), unless the authorization is terminated or revoked.  Performed at [facility], [place] [address], [place], [place] [address]   Resp Panel by RT-PCR (Flu A&B, Covid) Nasopharyngeal Swab     Status: None   Collection Time: 07/25/21  4:59 PM   Specimen: Nasopharyngeal Swab; Nasopharyngeal(NP) swabs in vial transport medium  Result Value Ref Range Status   SARS Coronavirus 2 by RT PCR NEGATIVE NEGATIVE Final    Comment: (NOTE) SARS-CoV-2  target nucleic acids are NOT DETECTED.  The SARS-CoV-2 RNA is generally detectable in upper respiratory specimens during the acute phase of infection. The lowest concentration of SARS-CoV-2 viral copies this assay can detect is 138 copies/mL. A negative result does not preclude SARS-Cov-2 infection and should not be used as the sole basis for treatment or other patient management decisions. A negative result may occur with  improper specimen collection/handling, submission of specimen other than nasopharyngeal swab, presence of viral mutation(s) within the areas targeted by this assay, and inadequate number of viral copies(<138 copies/mL). A negative result must be combined with clinical observations, patient history, and epidemiological information. The expected result is Negative.  Fact Sheet for Patients:  EntrepreneurPulse.com.au  Fact Sheet for Healthcare Providers:  IncredibleEmployment.be  This test is no t yet approved or cleared by the [place] FDA and  has been authorized for detection and/or diagnosis of SARS-CoV-2 by FDA under an Emergency Use Authorization (EUA). This EUA will remain  in effect (meaning this test can be used) for the duration of the COVID-19 declaration under Section 564(b)(1) of the Act, 21 U.S.C.section 360bbb-3(b)(1), unless the authorization is terminated  or revoked sooner.       Influenza A by PCR NEGATIVE NEGATIVE Final   Influenza B by PCR NEGATIVE NEGATIVE Final    Comment: (NOTE) The Xpert Xpress SARS-CoV-2/FLU/RSV plus assay is intended as an aid in the diagnosis of influenza from Nasopharyngeal swab specimens and should not be used as a sole basis for treatment. Nasal washings and aspirates are unacceptable for Xpert Xpress SARS-CoV-2/FLU/RSV testing.  Fact Sheet for Patients: EntrepreneurPulse.com.au  Fact Sheet for Healthcare  Providers: IncredibleEmployment.be  This test is not yet approved or cleared by the [place] FDA and has been authorized for detection and/or diagnosis of SARS-CoV-2 by FDA under an Emergency Use Authorization (EUA). This EUA will remain in effect (meaning this test can be used) for the duration of the COVID-19 declaration under Section 564(b)(1) of the Act, 21 U.S.C. section 360bbb-3(b)(1), unless the authorization is terminated or revoked.  Performed at [facility], [place],  [place] [address]      Labs: BNP (last 3 results) Recent Labs    06/14/21 1319  BNP 9,024.0*   Basic Metabolic Panel: Recent Labs  Lab 07/21/21 1201 07/23/21 1043 07/23/21 1108 07/24/21 0515 07/25/21 0620 07/26/21 0337  NA 140 138 141 139 140 139  K 3.1* 4.5 3.7 2.8* 4.1 4.0  CL 109 110 111 110 112* 113*  CO2 22 20*  --  20* 22 18*  GLUCOSE 105* 101* 91 92 98 86  BUN 37* 31* 42* 31* 31* 30*  CREATININE 3.25* 3.09* 3.10* 3.18* 3.12* 3.24*  CALCIUM 8.4 8.2*  --  8.4* 8.3* 8.4*  MG 1.5  --   --   --  1.7 2.1  PHOS  --   --   --   --  2.6 2.2*   Liver Function Tests: Recent Labs  Lab 07/23/21 1043 07/25/21 0620 07/26/21 0337  AST 47*  --   --   ALT 10  --   --   ALKPHOS 54  --   --   BILITOT 1.6*  --   --   PROT 4.6*  --   --   ALBUMIN 2.4* 2.3* 2.1*   No results for input(s): LIPASE, AMYLASE in the last 168 hours. No results for input(s): AMMONIA in the last 168 hours. CBC: Recent Labs  Lab 07/21/21 1201 07/23/21 1043 07/23/21 1108 07/24/21 0515 07/25/21 0620 07/26/21 0337  WBC 6.4 8.2  --  7.0 6.7 6.3  NEUTROABS 5.0 6.6  --   --   --   --   HGB 9.1* 9.5* 11.2* 8.9* 9.3* 8.6*  HCT 28.9* 32.0* 33.0* 29.3* 31.2* 28.2*  MCV 85.1 89.4  --  88.3 87.9 87.0  PLT 222.0 237  --  227 226 223   Cardiac Enzymes: No results for input(s): CKTOTAL, CKMB, CKMBINDEX, TROPONINI in the last 168 hours. BNP: Invalid input(s): POCBNP CBG: Recent  Labs  Lab 07/23/21 1043  GLUCAP 78   D-Dimer No results for input(s): DDIMER in the last 72 hours. Hgb A1c Recent Labs    07/23/21 1400  HGBA1C 4.7*   Lipid Profile Recent Labs    07/24/21 0515  CHOL 124  HDL 34*  LDLCALC 71  TRIG 95  CHOLHDL 3.6   Thyroid function studies Recent Labs    07/23/21 1322  TSH 1.765   Anemia work up Recent Labs    07/25/21 0620  VITAMINB12 1,274*  FOLATE 4.2*  FERRITIN 360*  TIBC 160*  IRON 94  RETICCTPCT 4.0*   Urinalysis    Component Value Date/Time   COLORURINE YELLOW 07/24/2021 0928   APPEARANCEUR CLEAR 07/24/2021 0928   LABSPEC 1.010 07/24/2021 0928   PHURINE 6.0 07/24/2021 0928   GLUCOSEU NEGATIVE 07/24/2021 0928   HGBUR NEGATIVE 07/24/2021 0928   HGBUR negative 10/04/2009 1044   BILIRUBINUR NEGATIVE 07/24/2021 0928   BILIRUBINUR negative 12/13/2017 1508   KETONESUR NEGATIVE 07/24/2021 0928   PROTEINUR 100 (A) 07/24/2021 0928   UROBILINOGEN 0.2 12/13/2017 1508   UROBILINOGEN 0.2 03/25/2012 1354   NITRITE NEGATIVE 07/24/2021 0928   LEUKOCYTESUR NEGATIVE 07/24/2021 0928   Sepsis Labs Invalid input(s): PROCALCITONIN,  WBC,  LACTICIDVEN Microbiology Recent Results (from the past 240 hour(s))  Resp Panel by RT-PCR (Flu A&B, Covid) Nasopharyngeal Swab     Status: None   Collection Time: 07/23/21 11:12 AM   Specimen: Nasopharyngeal Swab; Nasopharyngeal(NP) swabs in vial transport medium  Result Value Ref Range Status   SARS Coronavirus 2  by RT PCR NEGATIVE NEGATIVE Final    Comment: (NOTE) SARS-CoV-2 target nucleic acids are NOT DETECTED.  The SARS-CoV-2 RNA is generally detectable in upper respiratory specimens during the acute phase of infection. The lowest concentration of SARS-CoV-2 viral copies this assay can detect is 138 copies/mL. A negative result does not preclude SARS-Cov-2 infection and should not be used as the sole basis for treatment or other patient management decisions. A negative result may  occur with  improper specimen collection/handling, submission of specimen other than nasopharyngeal swab, presence of viral mutation(s) within the areas targeted by this assay, and inadequate number of viral copies(<138 copies/mL). A negative result must be combined with clinical observations, patient history, and epidemiological information. The expected result is Negative.  Fact Sheet for Patients:  EntrepreneurPulse.com.au  Fact Sheet for Healthcare Providers:  IncredibleEmployment.be  This test is no t yet approved or cleared by the [place] FDA and  has been authorized for detection and/or diagnosis of SARS-CoV-2 by FDA under an Emergency Use Authorization (EUA). This EUA will remain  in effect (meaning this test can be used) for the duration of the COVID-19 declaration under Section 564(b)(1) of the Act, 21 U.S.C.section 360bbb-3(b)(1), unless the authorization is terminated  or revoked sooner.       Influenza A by PCR NEGATIVE NEGATIVE Final   Influenza B by PCR NEGATIVE NEGATIVE Final    Comment: (NOTE) The Xpert Xpress SARS-CoV-2/FLU/RSV plus assay is intended as an aid in the diagnosis of influenza from Nasopharyngeal swab specimens and should not be used as a sole basis for treatment. Nasal washings and aspirates are unacceptable for Xpert Xpress SARS-CoV-2/FLU/RSV testing.  Fact Sheet for Patients: EntrepreneurPulse.com.au  Fact Sheet for Healthcare Providers: IncredibleEmployment.be  This test is not yet approved or cleared by the [place] FDA and has been authorized for detection and/or diagnosis of SARS-CoV-2 by FDA under an Emergency Use Authorization (EUA). This EUA will remain in effect (meaning this test can be used) for the duration of the COVID-19 declaration under Section 564(b)(1) of the Act, 21 U.S.C. section 360bbb-3(b)(1), unless the authorization is terminated  or revoked.  Performed at [facility], [place] [address] [place], [place] [address]   Resp Panel by RT-PCR (Flu A&B, Covid) Nasopharyngeal Swab     Status: None   Collection Time: 07/25/21  4:59 PM   Specimen: Nasopharyngeal Swab; Nasopharyngeal(NP) swabs in vial transport medium  Result Value Ref Range Status   SARS Coronavirus 2 by RT PCR NEGATIVE NEGATIVE Final    Comment: (NOTE) SARS-CoV-2 target nucleic acids are NOT DETECTED.  The SARS-CoV-2 RNA is generally detectable in upper respiratory specimens during the acute phase of infection. The lowest concentration of SARS-CoV-2 viral copies this assay can detect is 138 copies/mL. A negative result does not preclude SARS-Cov-2 infection and should not be used as the sole basis for treatment or other patient management decisions. A negative result may occur with  improper specimen collection/handling, submission of specimen other than nasopharyngeal swab, presence of viral mutation(s) within the areas targeted by this assay, and inadequate number of viral copies(<138 copies/mL). A negative result must be combined with clinical observations, patient history, and epidemiological information. The expected result is Negative.  Fact Sheet for Patients:  EntrepreneurPulse.com.au  Fact Sheet for Healthcare Providers:  IncredibleEmployment.be  This test is no t yet approved or cleared by the [place] FDA and  has been authorized for detection and/or diagnosis of SARS-CoV-2 by FDA under an Emergency  Use Authorization (EUA). This EUA will remain  in effect (meaning this test can be used) for the duration of the COVID-19 declaration under Section 564(b)(1) of the Act, 21 U.S.C.section 360bbb-3(b)(1), unless the authorization is terminated  or revoked sooner.       Influenza A by PCR NEGATIVE NEGATIVE Final   Influenza B by PCR NEGATIVE NEGATIVE Final    Comment: (NOTE) The Xpert Xpress  SARS-CoV-2/FLU/RSV plus assay is intended as an aid in the diagnosis of influenza from Nasopharyngeal swab specimens and should not be used as a sole basis for treatment. Nasal washings and aspirates are unacceptable for Xpert Xpress SARS-CoV-2/FLU/RSV testing.  Fact Sheet for Patients: EntrepreneurPulse.com.au  Fact Sheet for Healthcare Providers: IncredibleEmployment.be  This test is not yet approved or cleared by the [place] FDA and has been authorized for detection and/or diagnosis of SARS-CoV-2 by FDA under an Emergency Use Authorization (EUA). This EUA will remain in effect (meaning this test can be used) for the duration of the COVID-19 declaration under Section 564(b)(1) of the Act, 21 U.S.C. section 360bbb-3(b)(1), unless the authorization is terminated or revoked.  Performed at [facility], [place] [address], [place], [place] [address]     Please note: You were cared for by a hospitalist during your hospital stay. Once you are discharged, your primary care physician will handle any further medical issues. Please note that NO REFILLS for any discharge medications will be authorized once you are discharged, as it is imperative that you return to your primary care physician (or establish a relationship with a primary care physician if you do not have one) for your post hospital discharge needs so that they can reassess your need for medications and monitor your lab values.    Time coordinating discharge: 40 minutes  SIGNED:   Shelly Coss, MD  [facility] 07/26/2021, 8:45 AM Pager [phone]  If 7PM-7AM, please contact night-coverage [email or web address] Password [email or web address]

## 2021-07-26 NOTE — Progress Notes (Signed)
Report called and given to [place] at facility.

## 2021-07-26 NOTE — TOC Transition Note (Signed)
Transition of Care [facility]) - CM/SW Discharge Note   Patient Details  Name: [name] MRN: [id number] Date of Birth: Apr 26, 1946  [facility] CM/SW Contact:  Pollie Friar, RN Phone Number: 07/26/2021, 9:20 AM   Clinical Narrative:    Patient is discharging to [facility]. [facility] to provide transport. Cm has updated the family and bedside RN. Discharge packet is at the desk.   Number for report: [phone]   Final next level of care: Skilled Nursing Facility Barriers to Discharge: No Barriers Identified   Patient Goals and CMS Choice   CMS Medicare.gov Compare Post Acute Care list provided to:: Patient Represenative (must comment) Choice offered to / list presented to : Spouse  Discharge Placement PASRR number recieved: 07/26/21            Patient chooses bed at: [facility] Patient to be transferred to facility by: [place] Name of family member notified: Spouse Patient and family notified of of transfer: 07/26/21  Discharge Plan and Services In-house Referral: Clinical Social Work Discharge Planning Services: CM Consult Post Acute Care Choice: [facility]                               Social Determinants of Health (SDOH) Interventions     Readmission Risk Interventions Readmission Risk Prevention Plan 07/13/2021 11/18/2019  Transportation Screening Complete Complete  PCP or Specialist Appt within 3-5 Days - Complete  HRI or [place] - Complete  Social Work Consult for [place] Planning/Counseling - Complete  Palliative Care Screening - Not Applicable  Medication Review Press photographer) Complete Complete  HRI or Home Care Consult Complete -  SW Recovery Care/Counseling Consult Complete -  Palliative Care Screening Not Applicable -  [place] Not Applicable -  Some recent data might be hidden

## 2021-07-27 ENCOUNTER — Telehealth: Payer: Medicare Other

## 2021-07-28 ENCOUNTER — Telehealth: Payer: Self-pay | Admitting: Nurse Practitioner

## 2021-07-28 NOTE — Telephone Encounter (Signed)
Patient needs hospital f/u appointment and will address at that appointment. Please contact patient daughter and make appointment.

## 2021-07-28 NOTE — Telephone Encounter (Signed)
Daughter called asking if Baldo Ash could prescribe something to help with pts appetite.

## 2021-07-31 ENCOUNTER — Other Ambulatory Visit: Payer: Self-pay | Admitting: *Deleted

## 2021-07-31 ENCOUNTER — Encounter: Payer: Self-pay | Admitting: Nurse Practitioner

## 2021-07-31 DIAGNOSIS — Z515 Encounter for palliative care: Secondary | ICD-10-CM | POA: Diagnosis not present

## 2021-07-31 DIAGNOSIS — R54 Age-related physical debility: Secondary | ICD-10-CM | POA: Diagnosis not present

## 2021-07-31 DIAGNOSIS — I69351 Hemiplegia and hemiparesis following cerebral infarction affecting right dominant side: Secondary | ICD-10-CM | POA: Diagnosis not present

## 2021-07-31 DIAGNOSIS — I69320 Aphasia following cerebral infarction: Secondary | ICD-10-CM | POA: Diagnosis not present

## 2021-07-31 NOTE — Patient Outreach (Signed)
Per [place] eligible member resides in  [facility]. Screened for potential [facility] care coordination services as a benefit of [employer] plan.  Member's PCP at [facility] at [facility] has [place] care coordination services available if needed.  [name] admitted to SNF on 07/26/21 after hospitalization.  Communication sent to facility SW to inquire about transition plans/ to make aware writer is following for transition plans and [facility] needs.   Will collaborate with facility SW and follow up with resident/family as appropriate.  Will continue to follow for potential THN needs and transition plans while member resides in SNF.     Marthenia Rolling, MSN, RN,BSN [place] Acute Care Coordinator [phone] [facility]) [phone]  (Toll free office)

## 2021-08-01 ENCOUNTER — Other Ambulatory Visit: Payer: Self-pay

## 2021-08-01 ENCOUNTER — Encounter (HOSPITAL_COMMUNITY)
Admission: RE | Admit: 2021-08-01 | Discharge: 2021-08-01 | Disposition: A | Discharge status: Home / Self Care | Payer: Medicare Other | Source: Ambulatory Visit | Attending: Nephrology | Admitting: Nephrology

## 2021-08-01 VITALS — BP 149/78 | HR 70 | Temp 97.1°F | Resp 18

## 2021-08-01 DIAGNOSIS — L89152 Pressure ulcer of sacral region, stage 2: Secondary | ICD-10-CM | POA: Diagnosis not present

## 2021-08-01 DIAGNOSIS — N185 Chronic kidney disease, stage 5: Secondary | ICD-10-CM | POA: Diagnosis not present

## 2021-08-01 DIAGNOSIS — M6281 Muscle weakness (generalized): Secondary | ICD-10-CM | POA: Diagnosis not present

## 2021-08-01 DIAGNOSIS — R2681 Unsteadiness on feet: Secondary | ICD-10-CM | POA: Diagnosis not present

## 2021-08-01 DIAGNOSIS — I120 Hypertensive chronic kidney disease with stage 5 chronic kidney disease or end stage renal disease: Secondary | ICD-10-CM | POA: Diagnosis not present

## 2021-08-01 DIAGNOSIS — I69351 Hemiplegia and hemiparesis following cerebral infarction affecting right dominant side: Secondary | ICD-10-CM | POA: Diagnosis not present

## 2021-08-01 LAB — POCT HEMOGLOBIN-HEMACUE: Hemoglobin: 9.9 g/dL — ABNORMAL LOW (ref 12.0–15.0)

## 2021-08-01 MED ORDER — EPOETIN ALFA-EPBX 10000 UNIT/ML IJ SOLN
INTRAMUSCULAR | Status: AC
  Administered 2021-08-01: 10000 [IU] via SUBCUTANEOUS
  Filled 2021-08-01: qty 1

## 2021-08-01 MED ORDER — EPOETIN ALFA-EPBX 10000 UNIT/ML IJ SOLN: 10000.0000 [IU] | INTRAMUSCULAR | Status: DC

## 2021-08-02 ENCOUNTER — Other Ambulatory Visit: Payer: Self-pay | Admitting: *Deleted

## 2021-08-02 DIAGNOSIS — I120 Hypertensive chronic kidney disease with stage 5 chronic kidney disease or end stage renal disease: Secondary | ICD-10-CM | POA: Diagnosis not present

## 2021-08-02 DIAGNOSIS — D631 Anemia in chronic kidney disease: Secondary | ICD-10-CM | POA: Diagnosis not present

## 2021-08-02 DIAGNOSIS — N179 Acute kidney failure, unspecified: Secondary | ICD-10-CM | POA: Diagnosis not present

## 2021-08-02 DIAGNOSIS — R809 Proteinuria, unspecified: Secondary | ICD-10-CM | POA: Diagnosis not present

## 2021-08-02 DIAGNOSIS — D849 Immunodeficiency, unspecified: Secondary | ICD-10-CM | POA: Diagnosis not present

## 2021-08-02 DIAGNOSIS — E786 Lipoprotein deficiency: Secondary | ICD-10-CM | POA: Diagnosis not present

## 2021-08-02 DIAGNOSIS — Z940 Kidney transplant status: Secondary | ICD-10-CM | POA: Diagnosis not present

## 2021-08-02 DIAGNOSIS — K922 Gastrointestinal hemorrhage, unspecified: Secondary | ICD-10-CM | POA: Diagnosis not present

## 2021-08-02 DIAGNOSIS — R894 Abnormal immunological findings in specimens from other organs, systems and tissues: Secondary | ICD-10-CM | POA: Diagnosis not present

## 2021-08-02 DIAGNOSIS — N185 Chronic kidney disease, stage 5: Secondary | ICD-10-CM | POA: Diagnosis not present

## 2021-08-02 DIAGNOSIS — F32A Depression, unspecified: Secondary | ICD-10-CM | POA: Diagnosis not present

## 2021-08-02 DIAGNOSIS — N2581 Secondary hyperparathyroidism of renal origin: Secondary | ICD-10-CM | POA: Diagnosis not present

## 2021-08-02 NOTE — Patient Outreach (Signed)
Per [facility] THN eligible member resides in  [facility].  Screened for potential THN care coordination services as a benefit of member's insurance plan. ° °[name] admitted to SNF on 07/26/21 after hospitalization. ° °Facility site visit to [facility] skilled nursing facility. Met with Kristy, SNF SW concerning  transition plan and potential THN needs. Kristy reports anticipated transition plan is to return home with husband. ° °Spoke with [name] and spouse in room at [facility] to make introduction and discuss THN follow up. [name] asked that writer contact her daughter [name] (DPR) [phone] to discuss further. ° °Confirmed PCP is Charlotte [name], NP with [facility] at [place]. °Member's PCP at [facility] at [place] has THN Embedded care coordination team. ° °Provided THN Care Management brochure, 24-hr nurse advice line magnet, and writer's contact information.  ° °Telephone call made to [name] (daughter/DPR) [phone] as requested. Patient identifiers confirmed. Confirmation was brief because [name] was about to pump gas. Discussed writer is following for potential THN needs. Made [name] aware THN information was left at bedside with Mr. Hinde. Discussed that writer will contact at later time to discuss anyTHN needs and transition plans.  ° °Will continue to collaborate with [facility] SW while member resides in SNF. ° ° ° , MSN, RN,BSN °THN Post Acute Care Coordinator °[phone] ( [employer]) °[phone]  (Toll free office)  ° °  °

## 2021-08-02 NOTE — Telephone Encounter (Signed)
Spoke with patient husband and he states he has addressed this with the rehab facility and they informed him to reach out to the patient PCP to see what they suggest. He states this is very serious because since she has been in the rehab she has not ate much of anything. Please advise.

## 2021-08-03 NOTE — Telephone Encounter (Signed)
LVM for patient to return call. 

## 2021-08-04 ENCOUNTER — Other Ambulatory Visit: Payer: Self-pay | Admitting: Nurse Practitioner

## 2021-08-04 DIAGNOSIS — I4891 Unspecified atrial fibrillation: Secondary | ICD-10-CM | POA: Diagnosis not present

## 2021-08-04 DIAGNOSIS — Z940 Kidney transplant status: Secondary | ICD-10-CM | POA: Diagnosis not present

## 2021-08-04 DIAGNOSIS — R5381 Other malaise: Secondary | ICD-10-CM | POA: Diagnosis not present

## 2021-08-04 DIAGNOSIS — I69351 Hemiplegia and hemiparesis following cerebral infarction affecting right dominant side: Secondary | ICD-10-CM | POA: Diagnosis not present

## 2021-08-04 DIAGNOSIS — J4541 Moderate persistent asthma with (acute) exacerbation: Secondary | ICD-10-CM

## 2021-08-08 DIAGNOSIS — R2681 Unsteadiness on feet: Secondary | ICD-10-CM | POA: Diagnosis not present

## 2021-08-08 DIAGNOSIS — L89152 Pressure ulcer of sacral region, stage 2: Secondary | ICD-10-CM | POA: Diagnosis not present

## 2021-08-08 DIAGNOSIS — I69351 Hemiplegia and hemiparesis following cerebral infarction affecting right dominant side: Secondary | ICD-10-CM | POA: Diagnosis not present

## 2021-08-08 DIAGNOSIS — M6281 Muscle weakness (generalized): Secondary | ICD-10-CM | POA: Diagnosis not present

## 2021-08-09 ENCOUNTER — Other Ambulatory Visit: Payer: Self-pay | Admitting: *Deleted

## 2021-08-09 NOTE — Patient Outreach (Signed)
THN Post- Acute Care Coordinator follow up. Per [place] eligible member resides in [facility].  Screened for potential [facility] care coordination services as a benefit of [employer] plan.  Member's PCP at [place] at [facility] has [place] care coordination team available if needed.   Facility site visit to [facility] skilled nursing facility. Met with [name] concerning member's progress, transition plan, and potential THN needs. [name] is showing improvement with therapy. [name] reports family care plan meeting from 08/04/21 revealed transition plan remains to return home with spouse.  Will continue to collaborate with SNF SW and follow transition plans/needs while member resides in SNF.   Member's daughter/DPR [name] is primary contact.   Marthenia Rolling, MSN, RN,BSN [place] Acute Care Coordinator [phone] [facility]) [phone]  (Toll free office)

## 2021-08-11 ENCOUNTER — Ambulatory Visit (INDEPENDENT_AMBULATORY_CARE_PROVIDER_SITE_OTHER): Payer: Medicare Other | Admitting: Infectious Diseases

## 2021-08-11 ENCOUNTER — Encounter: Payer: Self-pay | Admitting: Infectious Diseases

## 2021-08-11 ENCOUNTER — Other Ambulatory Visit: Payer: Self-pay

## 2021-08-11 VITALS — BP 156/80 | HR 71 | Temp 97.4°F

## 2021-08-11 DIAGNOSIS — D849 Immunodeficiency, unspecified: Secondary | ICD-10-CM | POA: Diagnosis not present

## 2021-08-11 DIAGNOSIS — M6281 Muscle weakness (generalized): Secondary | ICD-10-CM | POA: Diagnosis not present

## 2021-08-11 DIAGNOSIS — L89152 Pressure ulcer of sacral region, stage 2: Secondary | ICD-10-CM | POA: Diagnosis not present

## 2021-08-11 DIAGNOSIS — Z940 Kidney transplant status: Secondary | ICD-10-CM | POA: Diagnosis not present

## 2021-08-11 DIAGNOSIS — B009 Herpesviral infection, unspecified: Secondary | ICD-10-CM

## 2021-08-11 DIAGNOSIS — R2681 Unsteadiness on feet: Secondary | ICD-10-CM | POA: Diagnosis not present

## 2021-08-11 DIAGNOSIS — I69351 Hemiplegia and hemiparesis following cerebral infarction affecting right dominant side: Secondary | ICD-10-CM | POA: Diagnosis not present

## 2021-08-11 NOTE — Progress Notes (Addendum)
Patient Active Problem List   Diagnosis Date Noted   Chronic atrial fibrillation ([place])    Acute CVA (cerebrovascular accident) ([place]) 07/25/2021   Right leg weakness 07/23/2021   Erosive esophagitis/esophageal ulceration/duodenal ulceration 07/23/2021   Edema of right lower leg 07/23/2021   Acute GI bleeding 07/08/2021   Azotemia 07/08/2021   Pressure injury of skin 07/08/2021   Asthma, chronic, unspecified asthma severity, with acute exacerbation 06/15/2021   Acute renal failure ([place]) 06/15/2021   Chronic combined systolic and diastolic CHF (congestive heart failure) ([place]) 06/15/2021   Acute respiratory failure with hypoxia ([place]) 06/14/2021   Duodenal ulcer with hemorrhage 03/29/2021   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   Gastroesophageal reflux disease 06/29/2020   Iron deficiency anemia 06/29/2020   SOB (shortness of breath) 03/11/2020   Pneumonia due to COVID-19 virus 03/11/2020   Traumatic seroma of lower leg, right, subsequent encounter 01/28/2020   Other secondary pulmonary hypertension ([place]) 12/03/2019   Anticoagulation management encounter 11/04/2019   Acute on chronic combined systolic and diastolic congestive heart failure ([place]) 10/22/2019   Pacemaker 04/18/2018   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   Personal history of PE (pulmonary embolism) 73/53/2992   Chronic systolic heart failure ([place]) 02/11/2018   Hyperkalemia 02/03/2018   Lower extremity edema 12/10/2017   Presence of Watchman left atrial appendage closure device 10/04/2017   Chronic respiratory failure with hypoxia ([place]) 10/03/2017   Dyspnea 07/26/2017   Atrial flutter ([place])    Peripheral vascular disease ([place])    Oligouria    Hypertension    Fractures, stress    Eczema    Depression    Constipation    Arthritis    Exacerbation of asthma 04/01/2017   Closed fracture of right distal radius 10/18/2016   Proteinuria 09/04/2016   History of kidney transplant 2017 07/25/2015    Immunosuppression ([place]) 07/25/2015   Nonischemic dilated cardiomyopathy (HCC)    S/P insertion of IVC (inferior vena caval) filter 04/07/2013   Use of cane as ambulatory aid 04/07/2013   S/P hip replacement 03/25/2012   DVT of lower extremity, bilateral ([place]) 12/21/2011   Personal history of DVT/PE s/p IVC filter 12/08/2011   Obstructive sleep apnea 12/08/2011   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[place]) 10/24/2011   Pseudomonas aeruginosa infection 10/24/2011   CKD (chronic kidney disease), stage V ([place]) 09/14/2011   Hypercalcemia    Gout 09/10/2011   Acute blood loss anemia secondary to diverticular bleed  09/04/2011   Absence of right hip joint with antibiotic pacer 09/03/2011   Traumatic seroma of thigh ([place]) 06/11/2011   Clostridium difficile colitis 06/09/2011   Leukocytosis 06/08/2011   Symptomatic anemia 06/06/2011   SINUSITIS- ACUTE-NOS 09/27/2010   Atrial fibrillation s/p AVN ablation/PPM implant, s/p LAAC with Watchman 11/25/2008   CVA 09/28/2008   History of CVA with residual deficit 09/28/2008   Hemiplegia ([place]) 09/21/2008   Psoriasis 10/20/2007   Asthmatic bronchitis without complication 42/68/3419    Patient's Medications  New Prescriptions   No medications on file  Previous Medications   ACETAMINOPHEN (TYLENOL) 500 MG TABLET    Take 500 mg by mouth every 6 (six) hours as needed for mild pain or headache.    ALBUTEROL (PROVENTIL) (2.5 MG/3ML) 0.083% NEBULIZER SOLUTION    Take 3 mLs (2.5 mg total) by nebulization every 6 (six) hours as needed for wheezing or shortness of breath. Dx:J45.909   ALBUTEROL (VENTOLIN HFA) 108 (90 BASE) MCG/ACT INHALER  INHALE 1-2 PUFFS BY MOUTH EVERY 6 HOURS AS NEEDED FOR WHEEZE OR SHORTNESS OF BREATH   APIXABAN (ELIQUIS) 2.5 MG TABS TABLET    Take 1 tablet (2.5 mg total) by mouth 2 (two) times daily.   ATORVASTATIN (LIPITOR) 20 MG TABLET    Take 1 tablet (20 mg total) by mouth daily.   BUDESONIDE-FORMOTEROL (SYMBICORT) 160-4.5 MCG/ACT INHALER     Inhale 2 puffs into the lungs 2 (two) times daily.   CARVEDILOL (COREG) 6.25 MG TABLET    Take 6.25 mg by mouth 2 (two) times daily.   DOCUSATE SODIUM (COLACE) 100 MG CAPSULE    Take 100 mg by mouth at bedtime as needed for mild constipation.   DULOXETINE HCL 40 MG CPEP    Take 20 mg by mouth daily. TAKE 1 CAPSULE BY MOUTH EVERY DAY   MYCOPHENOLATE (MYFORTIC) 180 MG EC TABLET    Take 180 mg by mouth 2 (two) times daily.   ONDANSETRON (ZOFRAN) 4 MG TABLET    Take 1 tablet (4 mg total) by mouth every 8 (eight) hours as needed for up to 9 doses for nausea.   PANTOPRAZOLE (PROTONIX) 40 MG TABLET    Take 1 tablet (40 mg total) by mouth 2 (two) times daily for 30 days, THEN 1 tablet (40 mg total) daily.   PREDNISONE (DELTASONE) 5 MG TABLET    Take 5 mg by mouth daily.   SODIUM BICARBONATE 650 MG TABLET    Take 1 tablet (650 mg total) by mouth 2 (two) times daily.   SUCRALFATE (CARAFATE) 1 G TABLET    Take 1 tablet (1 g total) by mouth 4 (four) times daily.   TACROLIMUS (PROGRAF) 1 MG CAPSULE    Take 1 mg by mouth 2 (two) times daily.  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 History taken from conversation with patient/husband and review of external medical records/EMR. 02/2018 - tongue lesion was positive for HSV 1 and was treated with a course of Valacyclovir. Recently noted to have oral sores in 06/2021 admission for which HSV serology was done and was positive for both HSV1 and HSV 2 abx. Patient and husband denies  prior knowledge of having genial HSV or any genital issues. Prior to the oral sores in dec 2022, she cannot think of another episode of oral sores at least 1 year prior to that.   1/8-1/11/21 - admitted for acute embolic CVA and started on low dose eliquis  112/23-2/29/22 - admitted for acute blood loss anemia due to rectal bleed and AKI on CKD EGD revealed esophageal ulcer, duodenal ulcer,  grade D LA erosive esophagitis and nonbleeding AVM treated with APC.  Flexible sigmoidoscopy showed blood in rectum, rectosigmoid colon and sigmoid colon, diverticulosis in the sigmoid colon with internal and external hemorrhoids. Capsule endoscopy unremarkable. Fu with GI and Nephro.   Review of Systems: ROS all systems reviewed and negative except as above  Past Medical History:  Diagnosis Date  Acute diastolic (congestive) heart failure (HCC) 08/12/2017   Acute GI bleeding 11/14/2019   Acute kidney injury superimposed on chronic kidney disease ([place]) 11/17/2017   AKI (acute kidney injury) ([place]) 11/04/2017   Anemia    Anxiety    ARF (acute renal failure) ([place]) 06/06/2011   Arthritis    "shoulders; arms; hips" (02/04/2018)   Asthma    Atrial fibrillation (HCC)    Atrial flutter ([place])    s/p aflutter ablation at [facility]   Bacteriuria, asymptomatic 11/14/2020   Benign hypertension with ESRD (end-stage renal disease) ([place])    Blood transfusion    never had a reaction to blood transfusion   Cardiomyopathy Mar 2013   Mild, EF 50-55% by Mar 2013 ECHO, diast dysfxn II   Cardiomyopathy ([place])    CHF (congestive heart failure) ([place]) 2005   CHF (congestive heart failure) ([place])    Childhood asthma    Closed fracture of right distal radius 10/18/2016   CMV (cytomegalovirus infection) ([place]) 10/03/2015   Constipation    takes Miralax daily   Constipation    Depression    takes Zoloft daily   Distal radius fracture, right    DVT of lower extremity, bilateral ([place]) 12/21/11   "they're there now; been there for 2  wks"   Eczema    End stage renal disease ([place]) 11/06/2011   ESRD (end stage renal disease) on dialysis [facility]) 09/2011   07-19-2015 had Kidney transplant at [facility]; "don't get dialysis anymore" (02/04/2018)   Essential hypertension 07/07/2007   Qualifier: Diagnosis of  By: Garen Grams     Fractures, stress    in both feet--6 OR 7 YRS AGO--RESOLVED   Generalized edema [id number]   GI bleed 11/14/2019   Gout    doesn't require meds    HCAP (healthcare-associated pneumonia) 09/10/2011   Hearing difficulty    Hematoma of left lower leg 05/17/2020   High cholesterol    History of CVA (cerebrovascular accident) without residual deficits    History of hip replacement, total, right    History of pneumonia    Hx of Clostridium difficile infection    Hypercalcemia    09/10/11   Hyperparathyroidism ([place]) 04/07/2013   Hypertension    takes Diltiazem daily    Hypomagnesemia 08/02/2015   Hypophosphatemia 11/08/2017   ICB (intracranial bleed) ([place])    Immunosuppression ([place]) 07/25/2015   Memory changes    Morbid obesity ([place])    Nonischemic dilated cardiomyopathy ([place])    Obesity 04/07/2013   Oligouria    Oral mucositis 02/22/2018   OSA on CPAP    PAF (paroxysmal atrial fibrillation) (HCC)     HX OF CEREBRAL BLEED WHILE ON COUMADIN-SO PT NOT ON ANY BLOOD THINNERS NOW   Peripheral vascular disease ([place])    Persistent atrial fibrillation ([place]) 08/12/2017   Overview:  Added automatically from request for surgery [id number]  Formatting of this note might be different from the original. Added automatically from request for surgery [id number]   Presence of Watchman left atrial appendage closure device 10/04/2017   Proteinuria 09/04/2016   Psoriasis 08/07/2016   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/25/2015   S/P insertion of IVC (inferior vena caval) filter    Sepsis ([place]) 11/08/2017   Stress fracture    bilateral feet   Stroke ([place]) 2009   denies residual on 02/04/2018;  hemorrhagic now off coumadin   Stroke  ([place]) 07/17/2007   denies residual, hemorrhagic now off coumadin   Stroke due to intracerebral hemorrhage ([place]) 2009  Suture reaction 09/04/2016   Transfusion history    Use of cane as ambulatory aid    Past Surgical History:  Procedure Laterality Date   AV FISTULA PLACEMENT  09/28/2011   Procedure: ARTERIOVENOUS (AV) FISTULA CREATION;  Surgeon: Rosetta Posner, MD;  Location: [place];  Service: Vascular;  Laterality: Left;   BIOPSY  07/10/2021   Procedure: BIOPSY;  Surgeon: Irving Copas., MD;  Location: [facility];  Service: Gastroenterology;;   CARDIAC CATHETERIZATION     CARDIAC VALVE [place] DEFECT   CHOLECYSTECTOMY     1980's   COLONOSCOPY     COLONOSCOPY WITH PROPOFOL N/A 11/16/2019   Procedure: COLONOSCOPY WITH PROPOFOL;  Surgeon: Juanita Craver, MD;  Location: [facility] ENDOSCOPY;  Service: Endoscopy;  Laterality: N/A;   CYSTOSCOPY     many yrs ago   ESOPHAGOGASTRODUODENOSCOPY N/A 07/10/2021   Procedure: ESOPHAGOGASTRODUODENOSCOPY (EGD);  Surgeon: Irving Copas., MD;  Location: [facility];  Service: Gastroenterology;  Laterality: N/A;   ESOPHAGOGASTRODUODENOSCOPY (EGD) WITH PROPOFOL N/A 11/16/2019   Procedure: ESOPHAGOGASTRODUODENOSCOPY (EGD) WITH PROPOFOL;  Surgeon: Juanita Craver, MD;  Location: [facility] ENDOSCOPY;  Service: Endoscopy;  Laterality: N/A;   ESOPHAGOGASTRODUODENOSCOPY (EGD) WITH PROPOFOL N/A 03/20/2021   Procedure: ESOPHAGOGASTRODUODENOSCOPY (EGD) WITH PROPOFOL;  Surgeon: Milus Banister, MD;  Location: [facility] ENDOSCOPY;  Service: Endoscopy;  Laterality: N/A;   FEMUR FRACTURE SURGERY Right 03/2011; 09/03/2011   "had 2, 2 wk apart in 2012; broke it again 08/2011 & had OR"   [place] N/A 07/10/2021   Procedure: FLEXIBLE SIGMOIDOSCOPY;  Surgeon: Rush Landmark Telford Nab., MD;  Location: [facility];  Service: Gastroenterology;  Laterality: N/A;   FRACTURE SURGERY     GIVENS CAPSULE STUDY N/A 07/11/2021   Procedure: GIVENS CAPSULE  STUDY;  Surgeon: Juanita Craver, MD;  [facility]: [facility];  Service: Endoscopy;  Laterality: N/A;   HEMOSTASIS CONTROL  03/20/2021   Procedure: HEMOSTASIS CONTROL;  Surgeon: Milus Banister, MD;  Location: [place];  Service: Endoscopy;;   HOT HEMOSTASIS N/A 03/20/2021   Procedure: HOT HEMOSTASIS (ARGON PLASMA COAGULATION/BICAP);  Surgeon: Milus Banister, MD;  Location: [facility] ENDOSCOPY;  Service: Endoscopy;  Laterality: N/A;   HOT HEMOSTASIS N/A 07/10/2021   Procedure: HOT HEMOSTASIS (ARGON PLASMA COAGULATION/BICAP);  Surgeon: Irving Copas., MD;  Location: [facility];  Service: Gastroenterology;  Laterality: N/A;   I & D EXTREMITY  09/15/2011   Procedure: IRRIGATION AND DEBRIDEMENT EXTREMITY w REMOVAL OF HARDWARE;  Surgeon: [name], MD;  Location: [place];  Service: Orthopedics;  Laterality: Right;   INCISION AND DRAINAGE HIP  12/21/2011   Procedure: IRRIGATION AND DEBRIDEMENT HIP;  Surgeon: [name], MD;  Location: [place];  Service: Orthopedics;  Laterality: Right;  I&D RIGHT HIP WITH PLACEMENT ANTIBIOTIC SPACER   INSERTION OF DIALYSIS CATHETER     Procedure: INSERTION OF DIALYSIS CATHETER;  Surgeon: Rosetta Posner, MD;  Location: [place];  Service: Vascular;  Laterality: Right;   IRRIGATION AND DEBRIDEMENT ABSCESS  12/21/11   right hip   JOINT REPLACEMENT     KIDNEY TRANSPLANT  07/19/15   LAPAROSCOPIC GASTRIC BANDING  2008   LEFT ATRIAL APPENDAGE OCCLUSION  10/04/2017   OPEN REDUCTION INTERNAL FIXATION (ORIF) DISTAL RADIAL FRACTURE Right 10/18/2016   Procedure: OPEN REDUCTION INTERNAL FIXATION (ORIF) RIGHT DISTAL RADIAL FRACTURE;  Surgeon: Marchia Bond, MD;  Location: [place];  Service: Orthopedics;  Laterality: Right;   SUBMUCOSAL TATTOO INJECTION  07/10/2021  Procedure: SUBMUCOSAL TATTOO INJECTION;  Surgeon: Irving Copas., MD;  Location: [facility];  Service: Gastroenterology;;   TOTAL HIP ARTHROPLASTY Right 02/2011   right THA 02/2011, I&D/removal  of hardware 09/2011,, repeat I&D Jun 2013, reimplantation R THA 03-26-2012   TOTAL HIP REVISION  03/25/2012   Procedure: TOTAL HIP REVISION;  Surgeon: [name], MD;  Location: [facility];  Service: Orthopedics;  Laterality: Right;  Right Total Hip Reimplantation   TUBAL LIGATION     VENA CAVA FILTER PLACEMENT  11/2011     Social History   Tobacco Use   Smoking status: Never   Smokeless tobacco: Never  Vaping Use   Vaping Use: Never used  Substance Use Topics   Alcohol use: Never   Drug use: Never    Family History  Problem Relation Age of Onset   Cancer Father    Diabetes Mother    Hypertension Mother    Arthritis Mother    Hodgkin's lymphoma Other 46       dscd---HODGKINS DISEASE   Hypertension Brother     Allergies  Allergen Reactions   Warfarin Sodium Other (See Comments)    Caused her to have a stroke/left side of brain to hemorrhage    Ace Inhibitors Cough   Amiodarone Other (See Comments)    Pt with Restrictive Lung Disease by 10/2017 PFT with decreased DLCO   Amlodipine Swelling    Swelling in feet    Health Maintenance  Topic Date Due   Zoster Vaccines- Shingrix (1 of 2) Never done   COVID-19 Vaccine (4 - Booster) 08/19/2020   MAMMOGRAM  03/08/2022   TETANUS/TDAP  05/09/2023   COLONOSCOPY (Pts 45-43yrs Insurance coverage will need to be confirmed)  11/15/2029   Pneumonia Vaccine 76+ Years old  Completed   INFLUENZA VACCINE  Completed   DEXA SCAN  Completed   Hepatitis C Screening  Completed   HPV VACCINES  Aged Out    Objective: BP (!) 156/80    Pulse 71    Temp (!) 97.4 F (36.3 C) (Temporal)    Physical Exam Constitutional:      Appearance: Normal appearance. In wheel chair  HENT:     Head: Normocephalic and atraumatic.      Mouth: Mucous membranes are moist. No oral sores and ulcers Eyes:    Conjunctiva/sclera: Conjunctivae normal.     Pupils: Pupils are equal, round  Cardiovascular:     Rate and Rhythm: Normal rate and regular rhythm.      Heart sounds:   Pulmonary:     Effort: Pulmonary effort is normal.     Breath sounds: Normal breath sounds.   Abdominal:     General: Non distended     Palpations: soft.   Musculoskeletal:        General: wheel chair  Skin:    General: Skin is warm and dry.     Comments:  Neurological:     General: rt sided weakness    Mental Status: awake, alert and oriented to person, place, and time.   Psychiatric:        Mood and Affect: Mood normal.   Lab Results Lab Results  Component Value Date   WBC 6.3 07/26/2021   HGB 9.9 (L) 08/01/2021   HCT 28.2 (L) 07/26/2021   MCV 87.0 07/26/2021   PLT 223 07/26/2021    Lab Results  Component Value Date   CREATININE 3.24 (H) 07/26/2021   BUN 30 (H) 07/26/2021   NA  139 07/26/2021   K 4.0 07/26/2021   CL 113 (H) 07/26/2021   CO2 18 (L) 07/26/2021    Lab Results  Component Value Date   ALT 10 07/23/2021   AST 47 (H) 07/23/2021   ALKPHOS 54 07/23/2021   BILITOT 1.6 (H) 07/23/2021    Lab Results  Component Value Date   CHOL 124 07/24/2021   HDL 34 (L) 07/24/2021   LDLCALC 71 07/24/2021   TRIG 95 07/24/2021   CHOLHDL 3.6 07/24/2021   No results found for: LABRPR, RPRTITER No results found for: HIV1RNAQUANT, HIV1RNAVL, CD4TABS   Problem List Items Addressed This Visit       Other   Renal transplant recipient - Primary   HSV (herpes simplex virus) infection   Immunocompromised patient ([place])   32 Y O AA Female with PMH of  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 6 months post renal transplant. Had one episode of HSV1 lesion in the tongue in  2019 which resolved with Valacyclovir and oral sores in Dec 2022 admission. No oral sores/ulcer on exam today ( resolved). Patient and husband cannot think of frequent recurrences of HSV in the oral cavity ( not at least in the last 1 year). Patient denies of any prior genital symptoms which could be due to HSV. At this time, no definite indication for continuous prophylactic therapy but can be considered in case frequently recurs. They would also like to discuss it with their transplant doctor at [facility] as needed   I have personally spent 65  minutes involved in face-to-face and non-face-to-face activities for this patient on the day of the visit. Professional time spent includes the following activities: Preparing to see the patient (review of tests), Obtaining and/or reviewing separately obtained history (admission/discharge record), Performing a medically appropriate examination and/or evaluation , Ordering medications/tests/procedures, referring and communicating with other health care professionals, Documenting clinical information in the EMR, Independently interpreting results (not separately reported), Communicating results to the patient/family/caregiver, Counseling and educating the patient/family/caregiver and Care coordination (not separately reported).   Wilber Oliphant, [place] [facility] 08/11/2021, 1:08 PM

## 2021-08-13 DIAGNOSIS — Z940 Kidney transplant status: Secondary | ICD-10-CM | POA: Insufficient documentation

## 2021-08-13 DIAGNOSIS — B009 Herpesviral infection, unspecified: Secondary | ICD-10-CM | POA: Insufficient documentation

## 2021-08-13 DIAGNOSIS — D849 Immunodeficiency, unspecified: Secondary | ICD-10-CM | POA: Insufficient documentation

## 2021-08-15 ENCOUNTER — Ambulatory Visit (HOSPITAL_COMMUNITY)
Admission: RE | Admit: 2021-08-15 | Discharge: 2021-08-15 | Disposition: A | Discharge status: Home / Self Care | Payer: Medicare Other | Source: Ambulatory Visit | Attending: Nephrology | Admitting: Nephrology

## 2021-08-15 ENCOUNTER — Other Ambulatory Visit: Payer: Self-pay

## 2021-08-15 VITALS — BP 121/75 | HR 70 | Temp 97.5°F | Resp 18

## 2021-08-15 DIAGNOSIS — M6281 Muscle weakness (generalized): Secondary | ICD-10-CM | POA: Diagnosis not present

## 2021-08-15 DIAGNOSIS — N185 Chronic kidney disease, stage 5: Secondary | ICD-10-CM | POA: Diagnosis not present

## 2021-08-15 DIAGNOSIS — I69351 Hemiplegia and hemiparesis following cerebral infarction affecting right dominant side: Secondary | ICD-10-CM | POA: Diagnosis not present

## 2021-08-15 DIAGNOSIS — L89152 Pressure ulcer of sacral region, stage 2: Secondary | ICD-10-CM | POA: Diagnosis not present

## 2021-08-15 DIAGNOSIS — R2681 Unsteadiness on feet: Secondary | ICD-10-CM | POA: Diagnosis not present

## 2021-08-15 LAB — POCT HEMOGLOBIN-HEMACUE: Hemoglobin: 10.7 g/dL — ABNORMAL LOW (ref 12.0–15.0)

## 2021-08-15 MED ORDER — EPOETIN ALFA-EPBX 10000 UNIT/ML IJ SOLN: 10000.0000 [IU] | INTRAMUSCULAR | Status: DC

## 2021-08-15 MED ORDER — EPOETIN ALFA-EPBX 10000 UNIT/ML IJ SOLN
INTRAMUSCULAR | Status: AC
  Administered 2021-08-15: 10000 [IU] via SUBCUTANEOUS
  Filled 2021-08-15: qty 1

## 2021-08-17 DIAGNOSIS — K5730 Diverticulosis of large intestine without perforation or abscess without bleeding: Secondary | ICD-10-CM | POA: Diagnosis not present

## 2021-08-17 DIAGNOSIS — D509 Iron deficiency anemia, unspecified: Secondary | ICD-10-CM | POA: Diagnosis not present

## 2021-08-17 DIAGNOSIS — M6281 Muscle weakness (generalized): Secondary | ICD-10-CM | POA: Diagnosis not present

## 2021-08-17 DIAGNOSIS — K269 Duodenal ulcer, unspecified as acute or chronic, without hemorrhage or perforation: Secondary | ICD-10-CM | POA: Diagnosis not present

## 2021-08-17 DIAGNOSIS — K5900 Constipation, unspecified: Secondary | ICD-10-CM | POA: Diagnosis not present

## 2021-08-17 DIAGNOSIS — R2681 Unsteadiness on feet: Secondary | ICD-10-CM | POA: Diagnosis not present

## 2021-08-17 DIAGNOSIS — Z1211 Encounter for screening for malignant neoplasm of colon: Secondary | ICD-10-CM | POA: Diagnosis not present

## 2021-08-17 DIAGNOSIS — I69351 Hemiplegia and hemiparesis following cerebral infarction affecting right dominant side: Secondary | ICD-10-CM | POA: Diagnosis not present

## 2021-08-17 DIAGNOSIS — L89152 Pressure ulcer of sacral region, stage 2: Secondary | ICD-10-CM | POA: Diagnosis not present

## 2021-08-18 DIAGNOSIS — R894 Abnormal immunological findings in specimens from other organs, systems and tissues: Secondary | ICD-10-CM | POA: Diagnosis not present

## 2021-08-18 DIAGNOSIS — D631 Anemia in chronic kidney disease: Secondary | ICD-10-CM | POA: Diagnosis not present

## 2021-08-18 DIAGNOSIS — I120 Hypertensive chronic kidney disease with stage 5 chronic kidney disease or end stage renal disease: Secondary | ICD-10-CM | POA: Diagnosis not present

## 2021-08-18 DIAGNOSIS — D849 Immunodeficiency, unspecified: Secondary | ICD-10-CM | POA: Diagnosis not present

## 2021-08-18 DIAGNOSIS — E876 Hypokalemia: Secondary | ICD-10-CM | POA: Diagnosis not present

## 2021-08-18 DIAGNOSIS — F32A Depression, unspecified: Secondary | ICD-10-CM | POA: Diagnosis not present

## 2021-08-18 DIAGNOSIS — N185 Chronic kidney disease, stage 5: Secondary | ICD-10-CM | POA: Diagnosis not present

## 2021-08-18 DIAGNOSIS — K922 Gastrointestinal hemorrhage, unspecified: Secondary | ICD-10-CM | POA: Diagnosis not present

## 2021-08-18 DIAGNOSIS — N2581 Secondary hyperparathyroidism of renal origin: Secondary | ICD-10-CM | POA: Diagnosis not present

## 2021-08-18 DIAGNOSIS — Z940 Kidney transplant status: Secondary | ICD-10-CM | POA: Diagnosis not present

## 2021-08-18 DIAGNOSIS — R809 Proteinuria, unspecified: Secondary | ICD-10-CM | POA: Diagnosis not present

## 2021-08-18 DIAGNOSIS — N179 Acute kidney failure, unspecified: Secondary | ICD-10-CM | POA: Diagnosis not present

## 2021-08-23 ENCOUNTER — Other Ambulatory Visit: Payer: Self-pay

## 2021-08-23 ENCOUNTER — Other Ambulatory Visit: Payer: Self-pay | Admitting: *Deleted

## 2021-08-23 ENCOUNTER — Encounter (HOSPITAL_COMMUNITY): Payer: Self-pay

## 2021-08-23 ENCOUNTER — Non-Acute Institutional Stay: Payer: Medicare Other | Admitting: Internal Medicine

## 2021-08-23 ENCOUNTER — Encounter: Payer: Self-pay | Admitting: Internal Medicine

## 2021-08-23 VITALS — BP 124/68 | HR 78 | Temp 98.3°F | Resp 20 | Wt 115.0 lb

## 2021-08-23 DIAGNOSIS — R29898 Other symptoms and signs involving the musculoskeletal system: Secondary | ICD-10-CM | POA: Diagnosis not present

## 2021-08-23 DIAGNOSIS — Z515 Encounter for palliative care: Secondary | ICD-10-CM | POA: Diagnosis not present

## 2021-08-23 DIAGNOSIS — U071 COVID-19: Secondary | ICD-10-CM | POA: Diagnosis not present

## 2021-08-23 DIAGNOSIS — Z940 Kidney transplant status: Secondary | ICD-10-CM | POA: Diagnosis not present

## 2021-08-23 DIAGNOSIS — M7989 Other specified soft tissue disorders: Secondary | ICD-10-CM

## 2021-08-23 NOTE — Patient Outreach (Signed)
THN Post- Acute Care Coordinator follow up. Per [place] eligible member resides in  [facility].  Screened for potential [facility] care coordination services as a benefit of [employer] plan.  Facility site visit to [facility] skilled nursing facility. Mrs. Toohey currently has COVID. Writer did not visit at bedside.   Member's PCP at [facility] has [place] care coordination team available if needed. CCM services would need to be ordered by PCP.  Will plan follow up with member's daughter to see if interested in [place] post SNF.   Will continue  to follow transition plans/needs while member resides in SNF.   Mrs. Tigges has medical history of ESRD,on HD, AFIB, HF, CVA, OSA, COVID.   Marthenia Rolling, MSN, RN,BSN [place] Acute Care Coordinator [phone] [facility]) [phone]  (Toll free office)

## 2021-08-23 NOTE — Progress Notes (Signed)
Therapist, nutritional Palliative Care Follow-Up Visit Telephone: [phone]  Fax: [phone]   Date of encounter: 08/23/21 1:56 PM PATIENT NAME: [name] [address] [place] [address]   [phone] (home)  DOB: [birth year] MRN: [id number] PRIMARY CARE PROVIDER:    Anne Ng, NP,  [address] [place] [address] [phone]  REFERRING PROVIDER:   Charlott Rakes, MD  RESPONSIBLE PARTY:    Contact Information     Name Relation Home Work Mobile   [name] Spouse [phone]  [phone]   [name] Daughter   [phone]   [name] Daughter   [phone]   [name] Daughter   [phone]        I met face to face with patient and family in facility. Palliative Care was asked to follow this patient by consultation request of  Dr. Yetta Flock to address advance care planning and complex medical decision making. This is follow-up visit.                                     ASSESSMENT AND PLAN / RECOMMENDATIONS:   Advance Care Planning/Goals of Care: Goals include to maximize quality of life and symptom management. Patient/health care surrogate gave his/her permission to discuss.Our advance care planning conversation included a discussion about:    The value and importance of advance care planning  Experiences with loved ones who have been seriously ill or have died  Exploration of personal, cultural or spiritual beliefs that might influence medical decisions  Exploration of goals of care in the event of a sudden injury or illness  Identification  of a healthcare agent--husband [name] Review and updating or creation of an  advance directive document . Decision not to resuscitate or to de-escalate disease focused treatments due to poor prognosis. CODE STATUS:  FULL CODE  Symptom Management/Plan: 1. COVID-19 virus infection -just diagnosed and currently in isolation, has cough and  congestion, but still eating and drinking, was sitting up wearing makeup, alert, oriented and conversive -continue to monitor and she plans to d/c home when she can come out of isolation and is "in the clear"  2. Right leg swelling -s/p stroke, she is now going to try compression hose recommended by primary here  3. Renal transplant recipient -continue her medication regimen for this -puts her at high risk for severe covid--? Had bivalent vaccine--I don't see it in her immunizations and could be why she got covid now (unless had at outside pharmacy and has not shared with anyone in system)  4. Right leg weakness -doing better getting around short distances with walker now and has continued to walk with walker in her room so she does not lose the strength she has gained with PT  5. Palliative care by specialist -will plan to follow-up and readdress her personal goals when she's at home, complete MOST form (first time she was too ill and now we're challenged by covid isolation gear) -RN working with me today unable to reach her husband and I could not reach him by phone last time I saw her either -did recommend pt contact us when she is home so we can arrange follow-up outpatient  Follow up Palliative Care Visit: Palliative care will continue to follow for complex medical decision making, advance care planning, and clarification of goals. Return when at home.  This visit was coded based on medical decision making (MDM).  PPS: 50%  HOSPICE ELIGIBILITY/DIAGNOSIS: TBD/not with current goals but does have ckd, chf  Chief Complaint: Follow-up palliative visit  HISTORY OF PRESENT ILLNESS:  [name] is a 76 y.o. year old female  with h/o renal transplant, stroke, afib s/p AV node ablation, pacemaker and LAAC with watchman, asthmatic bronchitis, c diff colitis, CKDV, constipation, depression, chronic systolic chf, pulmonary htn, among others.   She was here after her GI bleed, h/o stroke with  right hemiparesis.    She was doing well--had met therapy goals--she had been up walking with a walker in her room--and intended to go home today, but tested positive two days ago for covid.  She does have a cough and feels really tired.  She's on immunosuppressants with her h/o renal transplant.  She was thinking she'd stay through the weekend.  She's sleeping well.  She denied pain.  She will eventually go home with home PT and do her exercise plan.  Not short of breath walking.  Dry cough.  No fevers. Remains on carafate and protonix w/o issues with gerd since.  Remeron did not help appetite.  Dislikes food here and eager to eat home foods.  Likes to [employer].    She had ST, but denies dysphagia.  Is alert and oriented, conversive.  Eager to go home, but decided to stay a few more days due to new covid dx.  History obtained from review of EMR, discussion with primary team, and interview with family, facility staff/caregiver and/or [name].   I reviewed available labs, medications, imaging, studies and related documents from the EMR.  Records reviewed and summarized above.   ROS  General: NAD EYES: denies vision changes ENMT: denies dysphagia Cardiovascular: denies chest pain, denies DOE Pulmonary: has cough but not productive at this time, denies increased SOB Abdomen: endorses poor appetite especially for foods here, denies constipation, endorses continence of bowel GU: denies dysuria, endorses continence of urine MSK:  denies increased weakness,  no falls reported here, doing better walking with walker, has right LE weakness and swelling Skin: denies rashes or wounds Neurological: denies pain, denies insomnia Psych: Endorses positive mood Heme/lymph/immuno: denies bruises, abnormal bleeding  Physical Exam: Current and past weights:  115 lbs down from 124.8 lbs at admission Constitutional: NAD General: frail appearing, thin EYES: anicteric sclera, lids intact, no discharge  ENMT:  intact hearing, oral mucous membranes moist, dentition intact CV: S1S2, RRR, no LE edema, AV graft no longer in use Pulmonary: LCTA, no increased work of breathing, no cough, room air Abdomen: intake 25% here at best, does better with outside foods, normo-active BS + 4 quadrants, soft and non tender, no ascites GU: good urine output MSK: no sarcopenia, moves all extremities, ambulatory, chronic RLE edema with 2+ right foot, nonpitting rest of leg, R hemiparesis Skin: warm and dry, no rashes or wounds on visible skin Neuro:  no generalized weakness,  no cognitive impairment Psych: non-anxious affect, A and O x 3 Hem/lymph/immuno: no widespread bruising  CURRENT PROBLEM LIST:  Patient Active Problem List   Diagnosis Date Noted   Renal transplant recipient 08/13/2021   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   Immunocompromised patient ([place]) 08/13/2021   Chronic atrial fibrillation (HCC)    Acute CVA (cerebrovascular accident) ([place]) 07/25/2021   Right leg weakness 07/23/2021   Erosive esophagitis/esophageal ulceration/duodenal ulceration 07/23/2021   Edema of right lower leg 07/23/2021   Acute GI bleeding 07/08/2021   Azotemia 07/08/2021   Pressure injury of skin  07/08/2021   Asthma, chronic, unspecified asthma severity, with acute exacerbation 06/15/2021   Acute renal failure ([place]) 06/15/2021   Chronic combined systolic and diastolic CHF (congestive heart failure) ([place]) 06/15/2021   Acute respiratory failure with hypoxia ([place]) 06/14/2021   Duodenal ulcer with hemorrhage 03/29/2021   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   Gastroesophageal reflux disease 06/29/2020   Iron deficiency anemia 06/29/2020   SOB (shortness of breath) 03/11/2020   Pneumonia due to COVID-19 virus 03/11/2020   Traumatic seroma of lower leg, right, subsequent encounter 01/28/2020   Other secondary pulmonary hypertension ([place]) 12/03/2019   Anticoagulation management encounter 11/04/2019   Acute on  chronic combined systolic and diastolic congestive heart failure ([place]) 10/22/2019   Pacemaker 04/18/2018   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   Personal history of PE (pulmonary embolism) 46/65/9935   Chronic systolic heart failure ([place]) 02/11/2018   Hyperkalemia 02/03/2018   Lower extremity edema 12/10/2017   Presence of Watchman left atrial appendage closure device 10/04/2017   Chronic respiratory failure with hypoxia ([place]) 10/03/2017   Dyspnea 07/26/2017   Atrial flutter ([place])    Peripheral vascular disease ([place])    Oligouria    Hypertension    Fractures, stress    Eczema    Depression    Constipation    Arthritis    Exacerbation of asthma 04/01/2017   Closed fracture of right distal radius 10/18/2016   Proteinuria 09/04/2016   History of kidney transplant 2017 07/25/2015   Immunosuppression ([place]) 07/25/2015   Nonischemic dilated cardiomyopathy (HCC)    S/P insertion of IVC (inferior vena caval) filter 04/07/2013   Use of cane as ambulatory aid 04/07/2013   S/P hip replacement 03/25/2012   DVT of lower extremity, bilateral ([place]) 12/21/2011   Personal history of DVT/PE s/p IVC filter 12/08/2011   Obstructive sleep apnea 12/08/2011   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[place]) 10/24/2011   Pseudomonas aeruginosa infection 10/24/2011   CKD (chronic kidney disease), stage V ([place]) 09/14/2011   Hypercalcemia    Gout 09/10/2011   Acute blood loss anemia secondary to diverticular bleed  09/04/2011   Absence of right hip joint with antibiotic pacer 09/03/2011   Traumatic seroma of thigh ([place]) 06/11/2011   Clostridium difficile colitis 06/09/2011   Leukocytosis 06/08/2011   Symptomatic anemia 06/06/2011   SINUSITIS- ACUTE-NOS 09/27/2010   Atrial fibrillation s/p AVN ablation/PPM implant, s/p LAAC with Watchman 11/25/2008   CVA 09/28/2008   History of CVA with residual deficit 09/28/2008   Hemiplegia ([place]) 09/21/2008   Psoriasis 10/20/2007   Asthmatic bronchitis without complication  70/17/7939   PAST MEDICAL HISTORY:  Active Ambulatory Problems    Diagnosis Date Noted   Hemiplegia ([place]) 09/21/2008   Atrial fibrillation s/p AVN ablation/PPM implant, s/p LAAC with Watchman 11/25/2008   CVA 09/28/2008   Asthmatic bronchitis without complication 03/00/9233   Psoriasis 10/20/2007   History of CVA with residual deficit 09/28/2008   SINUSITIS- ACUTE-NOS 09/27/2010   Symptomatic anemia 06/06/2011   Leukocytosis 06/08/2011   Clostridium difficile colitis 06/09/2011   Traumatic seroma of thigh ([place]) 06/11/2011   Absence of right hip joint with antibiotic pacer 09/03/2011   Acute blood loss anemia secondary to diverticular bleed  09/04/2011   Gout 09/10/2011   Hypercalcemia    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[place]) 10/24/2011   Pseudomonas aeruginosa infection 10/24/2011   Personal history of DVT/PE s/p IVC filter 12/08/2011   Obstructive sleep apnea 12/08/2011   S/P hip replacement 03/25/2012   Nonischemic dilated cardiomyopathy ([place])  History of kidney transplant 2017 07/25/2015   Closed fracture of right distal radius 10/18/2016   Exacerbation of asthma 04/01/2017   Peripheral vascular disease (HCC)    Oligouria    Hypertension    Fractures, stress    CKD (chronic kidney disease), stage V (HCC) 09/14/2011   Eczema    DVT of lower extremity, bilateral (HCC) 12/21/2011   Depression    Constipation    Arthritis    Atrial flutter (HCC)    Dyspnea 07/26/2017   Chronic respiratory failure with hypoxia (HCC) 10/03/2017   Lower extremity edema 12/10/2017   Hyperkalemia 02/03/2018   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   Personal history of PE (pulmonary embolism) 02/22/2018   S/P insertion of IVC (inferior vena caval) filter 04/07/2013   Chronic systolic heart failure (HCC) 02/11/2018   Immunosuppression (HCC) 07/25/2015   Use of cane as ambulatory aid 04/07/2013   Pacemaker 04/18/2018   Presence of Watchman left atrial appendage closure device 10/04/2017    Proteinuria 09/04/2016   Acute on chronic combined systolic and diastolic congestive heart failure (HCC) 10/22/2019   Anticoagulation management encounter 11/04/2019   Other secondary pulmonary hypertension (HCC) 12/03/2019   Traumatic seroma of lower leg, right, subsequent encounter 01/28/2020   SOB (shortness of breath) 03/11/2020   Pneumonia due to COVID-19 virus 03/11/2020   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   Gastroesophageal reflux disease 06/29/2020   Iron deficiency anemia 06/29/2020   Duodenal ulcer with hemorrhage 03/29/2021   Acute respiratory failure with hypoxia (HCC) 06/14/2021   Asthma, chronic, unspecified asthma severity, with acute exacerbation 06/15/2021   Acute renal failure (HCC) 06/15/2021   Chronic combined systolic and diastolic CHF (congestive heart failure) (HCC) 06/15/2021   Acute GI bleeding 07/08/2021   Azotemia 07/08/2021   Pressure injury of skin 07/08/2021   Right leg weakness 07/23/2021   Erosive esophagitis/esophageal ulceration/duodenal ulceration 07/23/2021   Edema of right lower leg 07/23/2021   Acute CVA (cerebrovascular accident) (HCC) 07/25/2021   Chronic atrial fibrillation (HCC)    Renal transplant recipient 08/13/2021   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   Immunocompromised patient (HCC) 08/13/2021   Resolved Ambulatory Problems    Diagnosis Date Noted   TINEA CORPORIS 11/03/2008   Morbid obesity (HCC) 10/20/2007   DEPRESSION 11/25/2008   Essential hypertension 07/07/2007   Acute kidney injury superimposed on CKD (HCC) 11/25/2008   SEBACEOUS CYST, BREAST 11/03/2008   Nausea with vomiting 10/04/2009   Abdominal pain, unspecified site 10/04/2009   Low back pain radiating to right leg 10/10/2010   Hypokalemia 06/06/2011   ARF (acute renal failure) (HCC) 06/06/2011   Acute-on-chronic kidney injury (HCC) 09/10/2011   Dehydration 09/10/2011   Community acquired pneumonia of left lower lobe of lung 09/10/2011   HCAP  (healthcare-associated pneumonia)    ESRD (end stage renal disease) (HCC) 10/24/2011   End stage renal disease (HCC) 11/06/2011   Abscess of hip, right 12/24/2011   Constipation due to pain medication 02/12/2012   Expected blood loss anemia 03/26/2012   Hypotension 03/27/2012   Abscess of shoulder 05/15/2012   Skin ulceration (HCC) 05/09/2013   Stroke due to intracerebral hemorrhage (HCC) 07/17/2007   Stroke (HCC) 07/17/2007   Sleep apnea    Distal radius fracture, right    Acute respiratory failure with hypoxia (HCC) 09/04/2017   UTI (urinary tract infection) 11/03/2017   Diarrhea 11/03/2017   Septic shock (HCC) 11/03/2017   Acute kidney injury superimposed on chronic kidney disease (HCC) 11/17/2017  Acute diastolic (congestive) heart failure (HCC) 11/17/2017   Abscess of right leg 12/14/2017   Atrial fibrillation with RVR ([place]) 02/03/2018   Persistent atrial fibrillation ([place]) 08/12/2017   Oral mucositis 02/22/2018   S/P AV nodal ablation 10/08/2018   COPD exacerbation (HCC)    Acute CHF (congestive heart failure) ([place]) 10/23/2019   Acute GI bleeding 11/14/2019   GI bleed 11/14/2019   Wound of right leg 01/28/2020   Hematoma of left lower leg 05/17/2020   Bacteriuria, asymptomatic 11/14/2020   Past Medical History:  Diagnosis Date   AKI (acute kidney injury) ([place]) 11/04/2017   Anemia    Anxiety    Asthma    Atrial fibrillation ([place])    Benign hypertension with ESRD (end-stage renal disease) ([place])    Blood transfusion    Cardiomyopathy Mar 2013   Cardiomyopathy [facility])    CHF (congestive heart failure) ([place]) 2005   CHF (congestive heart failure) (HCC)    Childhood asthma    CMV (cytomegalovirus infection) ([place]) 10/03/2015   ESRD (end stage renal disease) on dialysis ([place]) 09/2011   Generalized edema [id number]   Gout    Hearing difficulty    High cholesterol    History of CVA (cerebrovascular accident) without residual deficits    History of hip replacement, total,  right    History of pneumonia    Hx of Clostridium difficile infection    Hyperparathyroidism ([place]) 04/07/2013   Hypomagnesemia 08/02/2015   Hypophosphatemia 11/08/2017   ICB (intracranial bleed) (HCC)    Memory changes    Obesity 04/07/2013   OSA on CPAP    Sepsis ([place]) 11/08/2017   Stress fracture    Suture reaction 09/04/2016   Transfusion history    SOCIAL HX:  Social History   Tobacco Use   Smoking status: Never   Smokeless tobacco: Never  Substance Use Topics   Alcohol use: Never     ALLERGIES:  Allergies  Allergen Reactions   Warfarin Sodium Other (See Comments)    Caused her to have a stroke/left side of brain to hemorrhage    Ace Inhibitors Cough   Amiodarone Other (See Comments)    Pt with Restrictive Lung Disease by 10/2017 PFT with decreased DLCO   Amlodipine Swelling    Swelling in feet     PERTINENT MEDICATIONS:   MED ERYTHROMYCIN 0.5% EYE OINTMENT- apply 1/2 inch to right eye four times daily x 5 days for conjunctivitis DeMarchi, William 08/29/21, 12:00 AM MED MUCINEX ER 600 MG TABLET- take 1 tab po twice daily for cough/congestion x 7 days DeMarchi, Gwyndolyn Saxon 08/25/21, 12:00 PM MED TESSALON PERLE 100 MG CAP ;Gibve 1 capsule by mouth three times daily for cough x 7 days DeMarchi, Gwyndolyn Saxon 08/25/21, 11:10 AM MED MAGNESIUM OXIDE 400 MG TABLET TAKE 1 TABLET BY MOUTH TWICE A DAY - HYPOMAGNESEMIA DeMarchi, William 08/24/21, 10:00 AM MED POTASSIUM CL ER 20 MEQ TABLET TAKE 1 TABLET BY MOUTH ONCE DAILY - POTASSIIUM SUPPLEMENT (DO NOT CRUSH) DeMarchi, William 08/24/21, 10:00 AM MED PANTOPRAZOLE SOD DR 40 MG TAB TAKE 1 TABLET BY MOUTH TWICE A DAY FOR EROSIVE ESOPHAGITIS/DUODENAL ULCERATION (DO NOT CRUSH) Hodges, [place] 08/18/21, 11:20 AM MED FUROSEMIDE 80 MG TABLET TAKE 1 TABLET BY MOUTH ON MONDAY, WEDNESDAY, AND FRIDAY Hyp chr kidney disease w stage 5 chr kidney disease or ESRD (I12.0) Nyra Capes, [place] 08/11/21, 10:00 AM MED MIRTAZAPINE 7.5 MG TABLET  TAKE 1 TABLET BY MOUTH AT BEDTIME Nyra Capes, [place] 08/10/21, 10:00 PM MED DULOXETINE HCL DR 20 MG  CAP TAKE 1 CAPSULE BY MOUTH ONCE DAILY (DO NOT CRUSH) Major depressive disorder, single episode, unspecified (F32.9) Nyra Capes, [place] 07/27/21, 10:00 AM MED ATORVASTATIN 20 MG TABLET TAKE 1 TABLET BY MOUTH ONCE DAILY [place], [place] 07/27/21, 10:00 AM MED PREDNISONE 5 MG TABLET TAKE 1 TABLET BY MOUTH ONCE DAILY Nyra Capes, [place] 07/27/21, 10:00 AM MED TACROLIMUS 1 MG CAPSULE (IR) TAKE 1 CAPSULE BY MOUTH TWICE A DAY (DO NOT CRUSH) (RtP) Kidney transplant status (Z94.0) Nyra Capes, [place] 07/26/21, 10:00 PM MED BUDESONIDE-FORMOTEROL 160-4.5 INHALE 2 PUFFS INTO THE LUNGS TWICE A DAY *GARGLE, RINSE MOUTH WITH WATER & SPIT OUT AFTER EACH USE. DO NOT SWALLOW THE RINSE WATERNyra Capes, [place] 07/26/21, 10:00 PM MED MYCOPHENOLIC ACID DR 665 MG TB TAKE 1 TABLET BY MOUTH TWICE A DAY (RtP) Kidney transplant status (Z94.0) Nyra Capes, [place] 07/26/21, 10:00 PM MED CARVEDILOL 6.25 MG TABLET TAKE 1 TABLET BY MOUTH TWICE A DAY Hyp chr kidney disease w stage 5 chr kidney disease or ESRD (I12.0) Nyra Capes, [place] 07/26/21, 7:00 PM MED ALBUTEROL SUL 2.5 MG/3 ML SOLN INHALE 1 VIAL VIA NEBULIZER EVERY 6 HOURS AS NEEDED - WHEEZING/SOB Hodges, [place] 07/26/21, 5:00 PM MED ALBUTEROL HFA 90 MCG INHALER INHALE 2 PUFFS INTO THE LUNGS EVERY 6 HOURS AS NEEDED - WHEEZING/SOB Hodges, [place] 07/26/21, 4:30 PM MED ONDANSETRON HCL 4 MG TABLET TAKE 1 TABLET BY MOUTH EVERY 8 HOURS AS NEEDED FOR NAUSEA Nyra Capes, [place] 07/26/21, 2:30 PM MED SODIUM BICARB 650 MG TABLET:Give 1 tablet by mouth twice daily Nyra Capes, [place] 07/26/21, 10:10 AM MED COLACE 100 MG CAPSULE:Give 1 capsule by mouth at bedtime as needed for mild constipation Maryella Shivers 07/26/21, 9:55 AM MED ELIQUIS 2.5 MG TABLET:Give 1 tablet by mouth twice daily for Acute Embolic CVA Maryella Shivers 07/26/21, 9:49 AM MED ACETAMINOPHEN  500 MG TABLET:Give 1 tablet by mouth every 6 hours as needed for mild pain or headche Maryella Shivers 07/26/21, 9:45 AM  Thank you for the opportunity to participate in the care of Ms. Hun.  The palliative care team will continue to follow. Please call our office at [phone] if we can be of additional assistance.   Hollace Kinnier, DO  COVID-19 PATIENT SCREENING TOOL Asked and negative response unless otherwise noted:  Have you had symptoms of covid, tested positive or been in contact with someone with symptoms/positive test in the past 5-10 days? Has covid now

## 2021-08-24 DIAGNOSIS — Z940 Kidney transplant status: Secondary | ICD-10-CM | POA: Diagnosis not present

## 2021-08-24 DIAGNOSIS — D464 Refractory anemia, unspecified: Secondary | ICD-10-CM | POA: Diagnosis not present

## 2021-08-24 DIAGNOSIS — U071 COVID-19: Secondary | ICD-10-CM | POA: Diagnosis not present

## 2021-08-24 DIAGNOSIS — R634 Abnormal weight loss: Secondary | ICD-10-CM | POA: Diagnosis not present

## 2021-08-28 ENCOUNTER — Telehealth: Payer: Self-pay

## 2021-08-28 NOTE — Progress Notes (Signed)
Chronic Care Management Pharmacy Assistant   Name: TRESHA MUZIO  MRN: [id number] DOB: Feb 12, 1946  Reason for Encounter:Hypertension Disease State Call.   Recent office visits:  07/21/2021 Wilfred Lacy NP (PCP) Increased cymbalta to 40mg  daily, Return in about 4 weeks  06/30/2021 Wilfred Lacy NP (PCP)  start Nystatin 500,000 units oral 4 times daily 06/21/2021 Wilfred Lacy NP (PCP) start hydrocodone bit-homatropine 5-1.5 MG/5ML PRN  Recent consult visits:  08/23/2021 Hollace Kinnier DO [facility]) No Medication Changes noted 08/11/2021 Dr. West Bali MD (Infectious Diseases) No Medication Changes noted 06/13/2021 Dr. Elsworth Soho MD (Pulmonary) Change Symbicort to 160-4.58 MCG/ACT 2 puffs 2 times daily, Return in about 6 months   Hospital visits:  Medication Reconciliation was completed by comparing discharge summary, patients EMR and Pharmacy list, and upon discussion with patient.  Admitted to the hospital on 07/23/2021 due to Right leg Weakness. Discharge date was 07/26/2021. Discharged from [place]?Medications Started at [facility] Discharge:?? -started Atorvastatin 20 mg on 07/27/2021  Medication Changes at Hospital Discharge: -Changed None  Medications Discontinued at Hospital Discharge: -Stopped None  Medications that remain the same after Hospital Discharge:??  -All other medications will remain the same.    Admitted to the hospital on 07/07/2021 due to Lower GI bleed. Discharge date was 07/13/2021. Discharged from [place]?Medications Started at [facility] Discharge:?? -started Pantoprazole 40 mg 1 tablet 2 times daily for 30 days then 1 tablet daily on 07/13/2021   Medication Changes at Hospital Discharge: -Changed None   Medications Discontinued at Hospital Discharge: -Stopped Aspirin  - Stopped Carvedilol - Stopped Furosemide - Stopped Guaifenesin - Stopped Hydrocodone bit-homatropine - Stopped  levofloxacin - Stopped nystatin - Stopped potassium Chloride   Medications that remain the same after Hospital Discharge:??  -All other medications will remain the same.    Admitted to the hospital on 06/14/2021 due to Persistent Asthma with exacerbation. Discharge date was 06/16/2021. Discharged from [place]?Medications Started at [facility] Discharge:?? -started furosemide 80 mg daily   Medication Changes at Hospital Discharge: -Changed Duloxetine 30 mg 1 capsule daily  Medications Discontinued at Hospital Discharge: -Stopped cefpodoxime   Medications that remain the same after Hospital Discharge:??  -All other medications will remain the same.    Medications: Outpatient Encounter Medications as of 08/28/2021  Medication Sig   acetaminophen (TYLENOL) 500 MG tablet Take 500 mg by mouth every 6 (six) hours as needed for mild pain or headache.    albuterol (PROVENTIL) (2.5 MG/3ML) 0.083% nebulizer solution Take 3 mLs (2.5 mg total) by nebulization every 6 (six) hours as needed for wheezing or shortness of breath. Dx:J45.909   albuterol (VENTOLIN HFA) 108 (90 Base) MCG/ACT inhaler INHALE 1-2 PUFFS BY MOUTH EVERY 6 HOURS AS NEEDED FOR WHEEZE OR SHORTNESS OF BREATH   apixaban (ELIQUIS) 2.5 MG TABS tablet Take 1 tablet (2.5 mg total) by mouth 2 (two) times daily.   atorvastatin (LIPITOR) 20 MG tablet Take 1 tablet (20 mg total) by mouth daily.   budesonide-formoterol (SYMBICORT) 160-4.5 MCG/ACT inhaler Inhale 2 puffs into the lungs 2 (two) times daily.   carvedilol (COREG) 6.25 MG tablet Take 6.25 mg by mouth 2 (two) times daily.   docusate sodium (COLACE) 100 MG capsule Take 100 mg by mouth at bedtime as needed for mild constipation.   DULoxetine HCl 40 MG CPEP Take 20 mg by mouth daily. TAKE 1 CAPSULE BY MOUTH EVERY DAY  furosemide (LASIX) 80 MG tablet Take 80 mg by mouth.   mirtazapine (REMERON) 7.5 MG tablet Take 7.5 mg by mouth at bedtime.    mycophenolate (MYFORTIC) 180 MG EC tablet Take 180 mg by mouth 2 (two) times daily.   ondansetron (ZOFRAN) 4 MG tablet Take 1 tablet (4 mg total) by mouth every 8 (eight) hours as needed for up to 9 doses for nausea.   pantoprazole (PROTONIX) 40 MG tablet Take 1 tablet (40 mg total) by mouth 2 (two) times daily for 30 days, THEN 1 tablet (40 mg total) daily.   predniSONE (DELTASONE) 5 MG tablet Take 5 mg by mouth daily.   sucralfate (CARAFATE) 1 g tablet Take 1 tablet (1 g total) by mouth 4 (four) times daily.   tacrolimus (PROGRAF) 1 MG capsule Take 1 mg by mouth 2 (two) times daily.   No facility-administered encounter medications on file as of 08/28/2021.    Care Gaps: Shingrix Vaccine COVID-19 Vaccine (4- Booster) Star Rating Drugs: Atorvastatin 20 mg not on filled report Medication Fill Gaps: None   Reviewed chart prior to disease state call. Spoke with patient regarding BP  Recent Office Vitals: BP Readings from Last 3 Encounters:  08/23/21 124/68  08/15/21 121/75  08/11/21 (!) 156/80   Pulse Readings from Last 3 Encounters:  08/23/21 78  08/15/21 70  08/11/21 71    Wt Readings from Last 3 Encounters:  08/23/21 115 lb (52.2 kg)  07/25/21 123 lb 3.1 oz (55.9 kg)  07/21/21 123 lb 3.2 oz (55.9 kg)     Kidney Function Lab Results  Component Value Date/Time   CREATININE 3.24 (H) 07/26/2021 03:37 AM   CREATININE 3.12 (H) 07/25/2021 06:20 AM   GFR 13.63 (LL) 07/21/2021 12:01 PM   GFRNONAA 15 (L) 07/26/2021 03:37 AM   GFRAA 24 (L) 03/13/2020 11:18 AM    BMP Latest Ref Rng & Units 07/26/2021 07/25/2021 07/24/2021  Glucose 70 - 99 mg/dL 86 98 92  BUN 8 - 23 mg/dL 30(H) 31(H) 31(H)  Creatinine 0.44 - 1.00 mg/dL 3.24(H) 3.12(H) 3.18(H)  BUN/Creat Ratio 12 - 28 - - -  Sodium 135 - 145 mmol/L 139 140 139  Potassium 3.5 - 5.1 mmol/L 4.0 4.1 2.8(L)  Chloride 98 - 111 mmol/L 113(H) 112(H) 110  CO2 22 - 32 mmol/L 18(L) 22 20(L)  Calcium 8.9 - 10.3 mg/dL 8.4(L) 8.3(L) 8.4(L)     Current antihypertensive regimen:  None How often are you checking your Blood Pressure? several times per month  Current home BP readings:  Patient states her blood pressure has been good ranging around 110/80's.Patient states she was release to day from rehab center.  What recent interventions/DTPs have been made by any provider to improve Blood Pressure control since last CPP Visit:  Carvedilol was stop by the ED on 07/13/2021.  Any recent hospitalizations or ED visits since last visit with CPP? Yes What diet changes have been made to improve Blood Pressure Control?  Patient denies any changes to her diet.  What exercise is being done to improve your Blood Pressure Control?  None ID  Adherence Review: Is the patient currently on ACE/ARB medication? No Does the patient have >5 day gap between last estimated fill dates? No  Patient agreed to schedule a telephone appointment with the clinical pharmacist on 11/23/2021 at 8:30 am since it has been over a year.  [place] Pharmacist Assistant [phone]

## 2021-08-29 ENCOUNTER — Inpatient Hospital Stay (HOSPITAL_COMMUNITY): Admission: RE | Admit: 2021-08-29 | Payer: Medicare Other | Source: Ambulatory Visit

## 2021-08-29 DIAGNOSIS — I69359 Hemiplegia and hemiparesis following cerebral infarction affecting unspecified side: Secondary | ICD-10-CM | POA: Diagnosis not present

## 2021-08-29 DIAGNOSIS — I120 Hypertensive chronic kidney disease with stage 5 chronic kidney disease or end stage renal disease: Secondary | ICD-10-CM | POA: Diagnosis not present

## 2021-08-29 DIAGNOSIS — L89152 Pressure ulcer of sacral region, stage 2: Secondary | ICD-10-CM | POA: Diagnosis not present

## 2021-08-29 DIAGNOSIS — I5042 Chronic combined systolic (congestive) and diastolic (congestive) heart failure: Secondary | ICD-10-CM | POA: Diagnosis not present

## 2021-08-29 DIAGNOSIS — R2681 Unsteadiness on feet: Secondary | ICD-10-CM | POA: Diagnosis not present

## 2021-08-29 DIAGNOSIS — U071 COVID-19: Secondary | ICD-10-CM | POA: Diagnosis not present

## 2021-08-29 DIAGNOSIS — M6281 Muscle weakness (generalized): Secondary | ICD-10-CM | POA: Diagnosis not present

## 2021-08-29 DIAGNOSIS — I69351 Hemiplegia and hemiparesis following cerebral infarction affecting right dominant side: Secondary | ICD-10-CM | POA: Diagnosis not present

## 2021-08-30 ENCOUNTER — Other Ambulatory Visit: Payer: Self-pay | Admitting: *Deleted

## 2021-08-30 ENCOUNTER — Telehealth: Payer: Self-pay | Admitting: Nurse Practitioner

## 2021-08-30 NOTE — Telephone Encounter (Signed)
[facility] FORMS received via fax Type of Form: _x__ CERTIFICATION       ___ RECERTIFICATION GREEN charge sheet attached and placed on [name]'s desk. ~~~ route to CMA/provider Team   They said they orders were overdue

## 2021-08-30 NOTE — Patient Outreach (Signed)
[facility] Post- Acute Care Coordinator follow up. Per [place] eligible member resides in  [facility].  Screened for potential [facility] care coordination services as a benefit of [employer] plan.  Facility site visit to [facility]. Spoke with SNF staff. Made aware that [name] transitioned to home today 08/30/21 with spouse. Will have [facility]) for home health services.    Member's PCP at [facility] has [place] care coordination team available if needed. CCM services would need to be ordered by PCP.   Per chart notes, it appears [name] is active with CCM services with Pharmacist at PCP practice. Will send notification to [facility] care guide of member's discharge from SNF today.   Marthenia Rolling, MSN, RN,BSN [place] Acute Care Coordinator [phone] [facility]) [phone]  (Toll free office)

## 2021-08-31 DIAGNOSIS — L89152 Pressure ulcer of sacral region, stage 2: Secondary | ICD-10-CM | POA: Diagnosis not present

## 2021-08-31 DIAGNOSIS — M6281 Muscle weakness (generalized): Secondary | ICD-10-CM | POA: Diagnosis not present

## 2021-08-31 DIAGNOSIS — I69351 Hemiplegia and hemiparesis following cerebral infarction affecting right dominant side: Secondary | ICD-10-CM | POA: Diagnosis not present

## 2021-08-31 DIAGNOSIS — R2681 Unsteadiness on feet: Secondary | ICD-10-CM | POA: Diagnosis not present

## 2021-08-31 NOTE — Telephone Encounter (Signed)
Forms given to provider.

## 2021-09-01 ENCOUNTER — Encounter (HOSPITAL_COMMUNITY): Payer: Self-pay

## 2021-09-01 DIAGNOSIS — N185 Chronic kidney disease, stage 5: Secondary | ICD-10-CM | POA: Diagnosis not present

## 2021-09-01 DIAGNOSIS — I5042 Chronic combined systolic (congestive) and diastolic (congestive) heart failure: Secondary | ICD-10-CM | POA: Diagnosis not present

## 2021-09-01 DIAGNOSIS — D631 Anemia in chronic kidney disease: Secondary | ICD-10-CM | POA: Diagnosis not present

## 2021-09-01 DIAGNOSIS — I428 Other cardiomyopathies: Secondary | ICD-10-CM | POA: Diagnosis not present

## 2021-09-01 DIAGNOSIS — N2581 Secondary hyperparathyroidism of renal origin: Secondary | ICD-10-CM | POA: Diagnosis not present

## 2021-09-01 DIAGNOSIS — M1030 Gout due to renal impairment, unspecified site: Secondary | ICD-10-CM | POA: Diagnosis not present

## 2021-09-01 DIAGNOSIS — Z9181 History of falling: Secondary | ICD-10-CM | POA: Diagnosis not present

## 2021-09-01 DIAGNOSIS — F330 Major depressive disorder, recurrent, mild: Secondary | ICD-10-CM | POA: Diagnosis not present

## 2021-09-01 DIAGNOSIS — Z7951 Long term (current) use of inhaled steroids: Secondary | ICD-10-CM | POA: Diagnosis not present

## 2021-09-01 DIAGNOSIS — I739 Peripheral vascular disease, unspecified: Secondary | ICD-10-CM | POA: Diagnosis not present

## 2021-09-01 DIAGNOSIS — I132 Hypertensive heart and chronic kidney disease with heart failure and with stage 5 chronic kidney disease, or end stage renal disease: Secondary | ICD-10-CM | POA: Diagnosis not present

## 2021-09-01 DIAGNOSIS — E7800 Pure hypercholesterolemia, unspecified: Secondary | ICD-10-CM | POA: Diagnosis not present

## 2021-09-01 DIAGNOSIS — F419 Anxiety disorder, unspecified: Secondary | ICD-10-CM | POA: Diagnosis not present

## 2021-09-01 DIAGNOSIS — G4733 Obstructive sleep apnea (adult) (pediatric): Secondary | ICD-10-CM | POA: Diagnosis not present

## 2021-09-01 DIAGNOSIS — D849 Immunodeficiency, unspecified: Secondary | ICD-10-CM | POA: Diagnosis not present

## 2021-09-01 DIAGNOSIS — J45909 Unspecified asthma, uncomplicated: Secondary | ICD-10-CM | POA: Diagnosis not present

## 2021-09-01 DIAGNOSIS — Z7952 Long term (current) use of systemic steroids: Secondary | ICD-10-CM | POA: Diagnosis not present

## 2021-09-01 DIAGNOSIS — Z7901 Long term (current) use of anticoagulants: Secondary | ICD-10-CM | POA: Diagnosis not present

## 2021-09-01 DIAGNOSIS — K2210 Ulcer of esophagus without bleeding: Secondary | ICD-10-CM | POA: Diagnosis not present

## 2021-09-01 DIAGNOSIS — E46 Unspecified protein-calorie malnutrition: Secondary | ICD-10-CM | POA: Diagnosis not present

## 2021-09-01 DIAGNOSIS — I4819 Other persistent atrial fibrillation: Secondary | ICD-10-CM | POA: Diagnosis not present

## 2021-09-01 DIAGNOSIS — M150 Primary generalized (osteo)arthritis: Secondary | ICD-10-CM | POA: Diagnosis not present

## 2021-09-01 DIAGNOSIS — Z86718 Personal history of other venous thrombosis and embolism: Secondary | ICD-10-CM | POA: Diagnosis not present

## 2021-09-01 DIAGNOSIS — I69351 Hemiplegia and hemiparesis following cerebral infarction affecting right dominant side: Secondary | ICD-10-CM | POA: Diagnosis not present

## 2021-09-01 DIAGNOSIS — H9190 Unspecified hearing loss, unspecified ear: Secondary | ICD-10-CM | POA: Diagnosis not present

## 2021-09-03 DIAGNOSIS — I132 Hypertensive heart and chronic kidney disease with heart failure and with stage 5 chronic kidney disease, or end stage renal disease: Secondary | ICD-10-CM | POA: Diagnosis not present

## 2021-09-03 DIAGNOSIS — N185 Chronic kidney disease, stage 5: Secondary | ICD-10-CM | POA: Diagnosis not present

## 2021-09-03 DIAGNOSIS — N2581 Secondary hyperparathyroidism of renal origin: Secondary | ICD-10-CM | POA: Diagnosis not present

## 2021-09-03 DIAGNOSIS — I69351 Hemiplegia and hemiparesis following cerebral infarction affecting right dominant side: Secondary | ICD-10-CM | POA: Diagnosis not present

## 2021-09-03 DIAGNOSIS — I5042 Chronic combined systolic (congestive) and diastolic (congestive) heart failure: Secondary | ICD-10-CM | POA: Diagnosis not present

## 2021-09-03 DIAGNOSIS — D631 Anemia in chronic kidney disease: Secondary | ICD-10-CM | POA: Diagnosis not present

## 2021-09-05 DIAGNOSIS — N2581 Secondary hyperparathyroidism of renal origin: Secondary | ICD-10-CM | POA: Diagnosis not present

## 2021-09-05 DIAGNOSIS — N185 Chronic kidney disease, stage 5: Secondary | ICD-10-CM | POA: Diagnosis not present

## 2021-09-05 DIAGNOSIS — I132 Hypertensive heart and chronic kidney disease with heart failure and with stage 5 chronic kidney disease, or end stage renal disease: Secondary | ICD-10-CM | POA: Diagnosis not present

## 2021-09-05 DIAGNOSIS — I69351 Hemiplegia and hemiparesis following cerebral infarction affecting right dominant side: Secondary | ICD-10-CM | POA: Diagnosis not present

## 2021-09-05 DIAGNOSIS — D631 Anemia in chronic kidney disease: Secondary | ICD-10-CM | POA: Diagnosis not present

## 2021-09-05 DIAGNOSIS — I5042 Chronic combined systolic (congestive) and diastolic (congestive) heart failure: Secondary | ICD-10-CM | POA: Diagnosis not present

## 2021-09-06 DIAGNOSIS — I5042 Chronic combined systolic (congestive) and diastolic (congestive) heart failure: Secondary | ICD-10-CM | POA: Diagnosis not present

## 2021-09-06 DIAGNOSIS — N2581 Secondary hyperparathyroidism of renal origin: Secondary | ICD-10-CM | POA: Diagnosis not present

## 2021-09-06 DIAGNOSIS — I132 Hypertensive heart and chronic kidney disease with heart failure and with stage 5 chronic kidney disease, or end stage renal disease: Secondary | ICD-10-CM | POA: Diagnosis not present

## 2021-09-06 DIAGNOSIS — D631 Anemia in chronic kidney disease: Secondary | ICD-10-CM | POA: Diagnosis not present

## 2021-09-06 DIAGNOSIS — I69351 Hemiplegia and hemiparesis following cerebral infarction affecting right dominant side: Secondary | ICD-10-CM | POA: Diagnosis not present

## 2021-09-06 DIAGNOSIS — N185 Chronic kidney disease, stage 5: Secondary | ICD-10-CM | POA: Diagnosis not present

## 2021-09-08 ENCOUNTER — Other Ambulatory Visit: Payer: Self-pay

## 2021-09-08 ENCOUNTER — Encounter: Payer: Self-pay | Admitting: Nurse Practitioner

## 2021-09-08 ENCOUNTER — Ambulatory Visit (INDEPENDENT_AMBULATORY_CARE_PROVIDER_SITE_OTHER): Payer: Medicare Other | Admitting: Nurse Practitioner

## 2021-09-08 VITALS — BP 114/60 | HR 74 | Temp 97.9°F | Ht 65.5 in | Wt 117.0 lb

## 2021-09-08 DIAGNOSIS — F331 Major depressive disorder, recurrent, moderate: Secondary | ICD-10-CM | POA: Diagnosis not present

## 2021-09-08 MED ORDER — FLUOXETINE HCL 10 MG PO CAPS: 10.0000 mg | ORAL_CAPSULE | Freq: Every day | ORAL | 5 refills | Status: DC

## 2021-09-08 NOTE — Progress Notes (Signed)
Subjective:  Patient ID: [name], female    DOB: [birth year]  Age: 76 y.o. MRN: [id number]  CC: Hospitalization Follow-up  HPI Accompanied by husband.  Hospitalization 01/08-01/11/23. She was discharged to [facility] facility. She returned home on 08/30/2021. She has has hired a Designer, industrial/product to assist with ADLs, 3x/week, 4hrs each day. She started Home PT on 09/03/21 and OT starts 09/14/21. Sessions will be every 2x/week. We reconciled her medication list. She admits to decreased appetite. No improvement with remeron 7.5 and 15mg  doses.She also reports persistent depressed mood despite use of cymbalta. They will will to switch medication to fluoxetine.  BP Readings from Last 3 Encounters:  09/08/21 114/60  08/23/21 124/68  08/15/21 121/75    Wt Readings from Last 3 Encounters:  09/08/21 117 lb (53.1 kg)  08/23/21 115 lb (52.2 kg)  07/25/21 123 lb 3.1 oz (55.9 kg)   Depression Continues to struggle with lack of motivation, energy and appetite. No improvement with cymbalta 40mg  and remeron 7.5mg .  They want to try fuoxetine. With discussed its pros and cons. Mrs and Mr. Griffeth verbalized understanding and agreed to start medication. Sent 10mg  F/up in 32month  Reviewed past Medical, Social and Family history today.  Outpatient Medications Prior to Visit  Medication Sig Dispense Refill   acetaminophen (TYLENOL) 500 MG tablet Take 500 mg by mouth every 6 (six) hours as needed for mild pain or headache.      albuterol (PROVENTIL) (2.5 MG/3ML) 0.083% nebulizer solution Take 3 mLs (2.5 mg total) by nebulization every 6 (six) hours as needed for wheezing or shortness of breath. Dx:J45.909 150 mL 2   albuterol (VENTOLIN HFA) 108 (90 Base) MCG/ACT inhaler INHALE 1-2 PUFFS BY MOUTH EVERY 6 HOURS AS NEEDED FOR WHEEZE OR SHORTNESS OF BREATH 8.5 each 1   apixaban (ELIQUIS) 2.5 MG TABS tablet Take 1 tablet (2.5 mg total) by mouth 2 (two) times daily. 60 tablet    atorvastatin (LIPITOR)  20 MG tablet Take 1 tablet (20 mg total) by mouth daily.     budesonide-formoterol (SYMBICORT) 160-4.5 MCG/ACT inhaler Inhale 2 puffs into the lungs 2 (two) times daily. 1 each 5   carvedilol (COREG) 6.25 MG tablet Take 6.25 mg by mouth 2 (two) times daily.     docusate sodium (COLACE) 100 MG capsule Take 100 mg by mouth at bedtime as needed for mild constipation.     furosemide (LASIX) 80 MG tablet Take 80 mg by mouth.     mycophenolate (MYFORTIC) 180 MG EC tablet Take 180 mg by mouth 2 (two) times daily.     ondansetron (ZOFRAN) 4 MG tablet Take 1 tablet (4 mg total) by mouth every 8 (eight) hours as needed for up to 9 doses for nausea. 9 tablet 0   pantoprazole (PROTONIX) 40 MG tablet Take 1 tablet (40 mg total) by mouth 2 (two) times daily for 30 days, THEN 1 tablet (40 mg total) daily. 120 tablet 0   predniSONE (DELTASONE) 5 MG tablet Take 5 mg by mouth daily.     tacrolimus (PROGRAF) 1 MG capsule Take 1 mg by mouth 2 (two) times daily.     DULoxetine HCl 40 MG CPEP Take 20 mg by mouth daily. TAKE 1 CAPSULE BY MOUTH EVERY DAY     mirtazapine (REMERON) 7.5 MG tablet Take 7.5 mg by mouth at bedtime.     sucralfate (CARAFATE) 1 g tablet Take 1 tablet (1 g total) by mouth 4 (four) times daily.  120 tablet 0   No facility-administered medications prior to visit.    ROS See HPI  Objective:  BP 114/60 (BP Location: Left Arm, Patient Position: Sitting, Cuff Size: Normal)    Pulse 74    Temp 97.9 F (36.6 C) (Temporal)    Ht 5' 5.5" (1.664 m)    Wt 117 lb (53.1 kg)    SpO2 96%    BMI 19.17 kg/m   Physical Exam Cardiovascular:     Rate and Rhythm: Normal rate. Rhythm irregular.     Pulses: Normal pulses.  Pulmonary:     Effort: Pulmonary effort is normal.     Breath sounds: Normal breath sounds.  Neurological:     Mental Status: She is alert and oriented to person, place, and time.  Psychiatric:        Attention and Perception: Attention normal.        Mood and Affect: Mood is  depressed.        Speech: Speech normal.        Behavior: Behavior is cooperative.        Thought Content: Thought content normal.        Cognition and Memory: Cognition is not impaired. Memory is not impaired.    Assessment & Plan:  This visit occurred during the SARS-CoV-2 public health emergency.  Safety protocols were in place, including screening questions prior to the visit, additional usage of staff PPE, and extensive cleaning of exam room while observing appropriate contact time as indicated for disinfecting solutions.   Fumi was seen today for hospitalization follow-up.  Diagnoses and all orders for this visit:  Moderate episode of recurrent major depressive disorder (HCC) -     FLUoxetine (PROZAC) 10 MG capsule; Take 1 capsule (10 mg total) by mouth daily.  Requested records from rehab facility.  Problem List Items Addressed This Visit       Other   Depression - Primary    Continues to struggle with lack of motivation, energy and appetite. No improvement with cymbalta 40mg  and remeron 7.5mg .  They want to try fuoxetine. With discussed its pros and cons. Mrs and Mr. Zumstein verbalized understanding and agreed to start medication. Sent 10mg  F/up in 68month      Relevant Medications   FLUoxetine (PROZAC) 10 MG capsule    Follow-up: Return in about 4 weeks (around 10/06/2021) for depression and appetite.  Wilfred Lacy, NP

## 2021-09-08 NOTE — Assessment & Plan Note (Signed)
Continues to struggle with lack of motivation, energy and appetite. No improvement with cymbalta 40mg  and remeron 7.5mg .  They want to try fuoxetine. With discussed its pros and cons. Mrs and Mr. Hendel verbalized understanding and agreed to start medication. Sent 10mg  F/up in 57month

## 2021-09-08 NOTE — Patient Instructions (Signed)
Stop remeron and cymbalta Start fluoxetine 10mg  F/up in 23month

## 2021-09-11 ENCOUNTER — Encounter: Payer: Self-pay | Admitting: Nurse Practitioner

## 2021-09-11 ENCOUNTER — Telehealth: Payer: Self-pay | Admitting: *Deleted

## 2021-09-11 ENCOUNTER — Telehealth: Payer: Self-pay | Admitting: Nurse Practitioner

## 2021-09-11 DIAGNOSIS — I69351 Hemiplegia and hemiparesis following cerebral infarction affecting right dominant side: Secondary | ICD-10-CM | POA: Diagnosis not present

## 2021-09-11 DIAGNOSIS — N185 Chronic kidney disease, stage 5: Secondary | ICD-10-CM | POA: Diagnosis not present

## 2021-09-11 DIAGNOSIS — N2581 Secondary hyperparathyroidism of renal origin: Secondary | ICD-10-CM | POA: Diagnosis not present

## 2021-09-11 DIAGNOSIS — D631 Anemia in chronic kidney disease: Secondary | ICD-10-CM | POA: Diagnosis not present

## 2021-09-11 DIAGNOSIS — I5042 Chronic combined systolic (congestive) and diastolic (congestive) heart failure: Secondary | ICD-10-CM | POA: Diagnosis not present

## 2021-09-11 DIAGNOSIS — I132 Hypertensive heart and chronic kidney disease with heart failure and with stage 5 chronic kidney disease, or end stage renal disease: Secondary | ICD-10-CM | POA: Diagnosis not present

## 2021-09-11 NOTE — Chronic Care Management (AMB) (Signed)
°  Chronic Care Management   Note  09/11/2021 Name: [name] MRN: [id number] DOB: 07/19/1945  [name] is a 76 y.o. year old female who is a primary care patient of [name], [name], NP. [name] is currently enrolled in care management services. An additional referral for RNCM was placed.   Follow up plan: Unsuccessful telephone outreach attempt made. A HIPAA compliant phone message was left for the patient providing contact information and requesting a return call.   Julian Hy, [place] Management  Direct Dial: [phone]

## 2021-09-11 NOTE — Telephone Encounter (Signed)
Yasmin Hollland(caregiver) is needing to know if pt should be taking Calcitriol and Prograf. Pt has these scripts and she wants to make sure this is correct. Please advise Yasmin, pt or Fredrick at [phone]

## 2021-09-12 ENCOUNTER — Encounter (HOSPITAL_COMMUNITY): Payer: Medicare Other

## 2021-09-12 DIAGNOSIS — D631 Anemia in chronic kidney disease: Secondary | ICD-10-CM | POA: Diagnosis not present

## 2021-09-12 DIAGNOSIS — N2581 Secondary hyperparathyroidism of renal origin: Secondary | ICD-10-CM | POA: Diagnosis not present

## 2021-09-12 DIAGNOSIS — I132 Hypertensive heart and chronic kidney disease with heart failure and with stage 5 chronic kidney disease, or end stage renal disease: Secondary | ICD-10-CM | POA: Diagnosis not present

## 2021-09-12 DIAGNOSIS — N185 Chronic kidney disease, stage 5: Secondary | ICD-10-CM | POA: Diagnosis not present

## 2021-09-12 DIAGNOSIS — I69351 Hemiplegia and hemiparesis following cerebral infarction affecting right dominant side: Secondary | ICD-10-CM | POA: Diagnosis not present

## 2021-09-12 DIAGNOSIS — I5042 Chronic combined systolic (congestive) and diastolic (congestive) heart failure: Secondary | ICD-10-CM | POA: Diagnosis not present

## 2021-09-13 ENCOUNTER — Telehealth: Payer: Self-pay | Admitting: Nurse Practitioner

## 2021-09-13 DIAGNOSIS — I5042 Chronic combined systolic (congestive) and diastolic (congestive) heart failure: Secondary | ICD-10-CM | POA: Diagnosis not present

## 2021-09-13 DIAGNOSIS — R479 Unspecified speech disturbances: Secondary | ICD-10-CM

## 2021-09-13 DIAGNOSIS — N2581 Secondary hyperparathyroidism of renal origin: Secondary | ICD-10-CM | POA: Diagnosis not present

## 2021-09-13 DIAGNOSIS — I132 Hypertensive heart and chronic kidney disease with heart failure and with stage 5 chronic kidney disease, or end stage renal disease: Secondary | ICD-10-CM | POA: Diagnosis not present

## 2021-09-13 DIAGNOSIS — D631 Anemia in chronic kidney disease: Secondary | ICD-10-CM | POA: Diagnosis not present

## 2021-09-13 DIAGNOSIS — N185 Chronic kidney disease, stage 5: Secondary | ICD-10-CM | POA: Diagnosis not present

## 2021-09-13 DIAGNOSIS — I69351 Hemiplegia and hemiparesis following cerebral infarction affecting right dominant side: Secondary | ICD-10-CM | POA: Diagnosis not present

## 2021-09-13 NOTE — Telephone Encounter (Signed)
Pt husband called and said they woould like to get a referral or recommendation for a speech therapist, there are still some discrepancies in her speaking and they are hoping it can help her speaking and maybe her memory a little bit. He said it can be done in house or they can go somewhere if needed. Callback is [phone] ?

## 2021-09-13 NOTE — Telephone Encounter (Signed)
I don't see where you have prescribed these medications for the patient, I do see it says previous provider. Please advise.  ?

## 2021-09-14 DIAGNOSIS — I5042 Chronic combined systolic (congestive) and diastolic (congestive) heart failure: Secondary | ICD-10-CM | POA: Diagnosis not present

## 2021-09-14 DIAGNOSIS — I69351 Hemiplegia and hemiparesis following cerebral infarction affecting right dominant side: Secondary | ICD-10-CM | POA: Diagnosis not present

## 2021-09-14 DIAGNOSIS — D631 Anemia in chronic kidney disease: Secondary | ICD-10-CM | POA: Diagnosis not present

## 2021-09-14 DIAGNOSIS — N2581 Secondary hyperparathyroidism of renal origin: Secondary | ICD-10-CM | POA: Diagnosis not present

## 2021-09-14 DIAGNOSIS — N185 Chronic kidney disease, stage 5: Secondary | ICD-10-CM | POA: Diagnosis not present

## 2021-09-14 DIAGNOSIS — I132 Hypertensive heart and chronic kidney disease with heart failure and with stage 5 chronic kidney disease, or end stage renal disease: Secondary | ICD-10-CM | POA: Diagnosis not present

## 2021-09-14 NOTE — Telephone Encounter (Signed)
Dana(pt's daughter) is checking on status of this message. ?

## 2021-09-15 DIAGNOSIS — R809 Proteinuria, unspecified: Secondary | ICD-10-CM | POA: Diagnosis not present

## 2021-09-15 DIAGNOSIS — Z940 Kidney transplant status: Secondary | ICD-10-CM | POA: Diagnosis not present

## 2021-09-15 DIAGNOSIS — N179 Acute kidney failure, unspecified: Secondary | ICD-10-CM | POA: Diagnosis not present

## 2021-09-15 DIAGNOSIS — K922 Gastrointestinal hemorrhage, unspecified: Secondary | ICD-10-CM | POA: Diagnosis not present

## 2021-09-15 DIAGNOSIS — I120 Hypertensive chronic kidney disease with stage 5 chronic kidney disease or end stage renal disease: Secondary | ICD-10-CM | POA: Diagnosis not present

## 2021-09-15 DIAGNOSIS — N2581 Secondary hyperparathyroidism of renal origin: Secondary | ICD-10-CM | POA: Diagnosis not present

## 2021-09-15 DIAGNOSIS — N185 Chronic kidney disease, stage 5: Secondary | ICD-10-CM | POA: Diagnosis not present

## 2021-09-15 DIAGNOSIS — F32A Depression, unspecified: Secondary | ICD-10-CM | POA: Diagnosis not present

## 2021-09-15 DIAGNOSIS — R894 Abnormal immunological findings in specimens from other organs, systems and tissues: Secondary | ICD-10-CM | POA: Diagnosis not present

## 2021-09-15 DIAGNOSIS — E876 Hypokalemia: Secondary | ICD-10-CM | POA: Diagnosis not present

## 2021-09-15 DIAGNOSIS — D631 Anemia in chronic kidney disease: Secondary | ICD-10-CM | POA: Diagnosis not present

## 2021-09-15 DIAGNOSIS — D849 Immunodeficiency, unspecified: Secondary | ICD-10-CM | POA: Diagnosis not present

## 2021-09-18 DIAGNOSIS — N185 Chronic kidney disease, stage 5: Secondary | ICD-10-CM | POA: Diagnosis not present

## 2021-09-18 DIAGNOSIS — N179 Acute kidney failure, unspecified: Secondary | ICD-10-CM | POA: Diagnosis not present

## 2021-09-18 DIAGNOSIS — I5042 Chronic combined systolic (congestive) and diastolic (congestive) heart failure: Secondary | ICD-10-CM | POA: Diagnosis not present

## 2021-09-18 DIAGNOSIS — I69351 Hemiplegia and hemiparesis following cerebral infarction affecting right dominant side: Secondary | ICD-10-CM | POA: Diagnosis not present

## 2021-09-18 DIAGNOSIS — N2581 Secondary hyperparathyroidism of renal origin: Secondary | ICD-10-CM | POA: Diagnosis not present

## 2021-09-18 DIAGNOSIS — D631 Anemia in chronic kidney disease: Secondary | ICD-10-CM | POA: Diagnosis not present

## 2021-09-18 DIAGNOSIS — I132 Hypertensive heart and chronic kidney disease with heart failure and with stage 5 chronic kidney disease, or end stage renal disease: Secondary | ICD-10-CM | POA: Diagnosis not present

## 2021-09-18 NOTE — Telephone Encounter (Signed)
Pts daughter called asking for a different speech therapist, saying [facility] doesn't have a working phone number...  ? ?I double checked the number, but she insists she cannot get in touch.  ?

## 2021-09-19 NOTE — Progress Notes (Unsigned)
[facility] [address] [place]. [place] [address] [phone]       HOSPITAL FOLLOW UP NOTE  [name] Date of Birth:  [birth year] Medical Record Number:  [id number]   Reason for Referral:  hospital stroke follow up    SUBJECTIVE:   CHIEF COMPLAINT:  No chief complaint on file.   HPI:   [name] is a 76 y.o. female with history of CKD-5 s/p renal transplant 2017, ICH, Afib s/p ablation and LAA closure not on AC, AVB s/p PPM, DVT/PE s/p IVC filter, duodenal ulcer, hx of ICH 5277, diastolic CHF, asthma, OSA, depression and recent hospitalziation for BLA due to GI bleed who presented on 07/23/2021 with RLE weakness, slurred speech, and confusion.  Personally reviewed hospitalization pertinent progress notes, lab work and imaging.  Evaluated by Dr. Erlinda Hong for left thalamus and bilateral frontal small infarcts likely secondary to A-fib not on [facility].  MRA negative LVO or high-grade stenosis.  EF 35 to 40%.  Lower extremity ultrasound negative for DVT.  LDL 71.  A1c 5.7. was on aspirin PTA with hx of s/p ablation/PPM implant and s/p Watchman device 2019.  Recommended initiation of low-dose Eliquis 2.5 mg twice daily if cleared by GI in setting of recent GIB.  HTN stable on carvedilol.  Initiated atorvastatin 20 mg daily for HLD management.  Evaluated by therapies who recommended SNF for ongoing therapy needs.  Discharged to [facility]hab on Aug 16, 2022    Today, 09/20/2021, patient being seen for initial hospital follow-up.  She returned home from [facility] rehab on 2/15.  Reports residual ***. Currently has a private CNA to assist with ADLs and working with [facility] PT/OT.          PERTINENT IMAGING  Per hospitalization 07/23/2021 Code Stroke CT head No acute abnormality. Advanced chronic ischemic disease as well as small vessel disease in L thalamus and pons. ASPECTS 10.    MRI  acute infarct in L thalamus, punctate aute infarcts in bilateral frontal lobes  consistent with cardioembolic etiology, moderate chronic micorvascular ischemic disease MRA head and neck no LVO or high grade stenosis 2D Echo EF 35-40% Lower Extremity US: no evidence of DVT bilaterally LDL 71 HgbA1c 4.7   ROS:   14 system review of systems performed and negative with exception of ***  PMH:  Past Medical History:  Diagnosis Date   Acute diastolic (congestive) heart failure (HCC) 08/12/2017   Acute GI bleeding 11/14/2019   Acute kidney injury superimposed on chronic kidney disease ([place]) 11/17/2017   AKI (acute kidney injury) ([place]) 11/04/2017   Anemia    Anxiety    ARF (acute renal failure) ([place]) 06/06/2011   Arthritis    "shoulders; arms; hips" (02/04/2018)   Asthma    Atrial fibrillation (HCC)    Atrial flutter ([place])    s/p aflutter ablation at [facility]   Bacteriuria, asymptomatic 11/14/2020   Benign hypertension with ESRD (end-stage renal disease) ([place])    Blood transfusion    never had a reaction to blood transfusion   Cardiomyopathy Mar 2013   Mild, EF 50-55% by Mar 2013 ECHO, diast dysfxn II   Cardiomyopathy ([place])    CHF (congestive heart failure) ([place]) 2005   CHF (congestive heart failure) ([place])    Childhood asthma    Closed fracture of right distal radius 10/18/2016   CMV (cytomegalovirus infection) ([place]) 10/03/2015   Constipation    takes Miralax daily   Constipation    Depression  takes Zoloft daily   Distal radius fracture, right    DVT of lower extremity, bilateral ([place]) 12/21/11   "they're there now; been there for 2 wks"   Eczema    End stage renal disease ([place]) 11/06/2011   ESRD (end stage renal disease) on dialysis [facility]) 09/2011   07-19-2015 had Kidney transplant at [facility]; "don't get dialysis anymore" (02/04/2018)   Essential hypertension 07/07/2007   Qualifier: Diagnosis of  By: Garen Grams     Fractures, stress    in both feet--6 OR 7 YRS AGO--RESOLVED   Generalized edema [id number]   GI bleed 11/14/2019   Gout    doesn't require meds     HCAP (healthcare-associated pneumonia) 09/10/2011   Hearing difficulty    Hematoma of left lower leg 05/17/2020   High cholesterol    History of CVA (cerebrovascular accident) without residual deficits    History of hip replacement, total, right    History of pneumonia    Hx of Clostridium difficile infection    Hypercalcemia    09/10/11   Hyperparathyroidism ([place]) 04/07/2013   Hypertension    takes Diltiazem daily    Hypomagnesemia 08/02/2015   Hypophosphatemia 11/08/2017   ICB (intracranial bleed) ([place])    Immunosuppression ([place]) 07/25/2015   Memory changes    Morbid obesity ([place])    Nonischemic dilated cardiomyopathy ([place])    Obesity 04/07/2013   Oligouria    Oral mucositis 02/22/2018   OSA on CPAP    PAF (paroxysmal atrial fibrillation) (HCC)     HX OF CEREBRAL BLEED WHILE ON COUMADIN-SO PT NOT ON ANY BLOOD THINNERS NOW   Peripheral vascular disease ([place])    Persistent atrial fibrillation ([place]) 08/12/2017   Overview:  Added automatically from request for surgery [id number]  Formatting of this note might be different from the original. Added automatically from request for surgery [id number]   Presence of Watchman left atrial appendage closure device 10/04/2017   Proteinuria 09/04/2016   Psoriasis 08/07/2016   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/25/2015   S/P insertion of IVC (inferior vena caval) filter    Sepsis ([place]) 11/08/2017   Stress fracture    bilateral feet   Stroke ([place]) 2009   denies residual on 02/04/2018;  hemorrhagic now off coumadin   Stroke ([place]) 07/17/2007   denies residual, hemorrhagic now off coumadin   Stroke due to intracerebral hemorrhage ([place]) 2009   Suture reaction 09/04/2016   Transfusion history    Use of cane as ambulatory aid     PSH:  Past Surgical History:  Procedure Laterality Date   AV FISTULA PLACEMENT  09/28/2011   Procedure: ARTERIOVENOUS (AV) FISTULA CREATION;  Surgeon: Rosetta Posner, MD;  Location: [place];  Service: Vascular;  Laterality: Left;    BIOPSY  07/10/2021   Procedure: BIOPSY;  Surgeon: Irving Copas., MD;  Location: [facility];  Service: Gastroenterology;;   CARDIAC CATHETERIZATION     CARDIAC VALVE [place] DEFECT   CHOLECYSTECTOMY     1980's   COLONOSCOPY     COLONOSCOPY WITH PROPOFOL N/A 11/16/2019   Procedure: COLONOSCOPY WITH PROPOFOL;  Surgeon: Juanita Craver, MD;  Location[facility]COPY;  Service: Endoscopy;  Laterality: N/A;   CYSTOSCOPY     many yrs ago   ESOPHAGOGASTRODUODENOSCOPY N/A 07/10/2021   Procedure: ESOPHAGOGASTRODUODENOSCOPY (EGD);  Surgeon: Irving Copas., MD;  Location: [facility];  Service: Gastroenterology;  Laterality: N/A;   ESOPHAGOGASTRODUODENOSCOPY (EGD) WITH PROPOFOL N/A 11/16/2019  Procedure: ESOPHAGOGASTRODUODENOSCOPY (EGD) WITH PROPOFOL;  Surgeon: Juanita Craver, MD;  Location: [facility] ENDOSCOPY;  Service: Endoscopy;  Laterality: N/A;   ESOPHAGOGASTRODUODENOSCOPY (EGD) WITH PROPOFOL N/A 03/20/2021   Procedure: ESOPHAGOGASTRODUODENOSCOPY (EGD) WITH PROPOFOL;  Surgeon: Milus Banister, MD;  Location: [facility] ENDOSCOPY;  Service: Endoscopy;  Laterality: N/A;   FEMUR FRACTURE SURGERY Right 03/2011; 09/03/2011   "had 2, 2 wk apart in 2012; broke it again 08/2011 & had OR"   [place] N/A 07/10/2021   Procedure: FLEXIBLE SIGMOIDOSCOPY;  Surgeon: Rush Landmark Telford Nab., MD;  Location: [facility];  Service: Gastroenterology;  Laterality: N/A;   FRACTURE SURGERY     GIVENS CAPSULE STUDY N/A 07/11/2021   Procedure: GIVENS CAPSULE STUDY;  Surgeon: Juanita Craver, MD;  Location: [facility];  Service: Endoscopy;  Laterality: N/A;   HEMOSTASIS CONTROL  03/20/2021   Procedure: HEMOSTASIS CONTROL;  Surgeon: Milus Banister, MD;  Location: [place];  Service: Endoscopy;;   HOT HEMOSTASIS N/A 03/20/2021   Procedure: HOT HEMOSTASIS (ARGON PLASMA COAGULATION/BICAP);  Surgeon: Milus Banister, MD;  Location: [facility] ENDOSCOPY;  Service: Endoscopy;  Laterality: N/A;    HOT HEMOSTASIS N/A 07/10/2021   Procedure: HOT HEMOSTASIS (ARGON PLASMA COAGULATION/BICAP);  Surgeon: Irving Copas., MD;  Location: [facility];  Service: Gastroenterology;  Laterality: N/A;   I & D EXTREMITY  09/15/2011   Procedure: IRRIGATION AND DEBRIDEMENT EXTREMITY w REMOVAL OF HARDWARE;  Surgeon: [name], MD;  Location: [place];  Service: Orthopedics;  Laterality: Right;   INCISION AND DRAINAGE HIP  12/21/2011   Procedure: IRRIGATION AND DEBRIDEMENT HIP;  Surgeon: [name], MD;  Location: [place];  Service: Orthopedics;  Laterality: Right;  I&D RIGHT HIP WITH PLACEMENT ANTIBIOTIC SPACER   INSERTION OF DIALYSIS CATHETER     Procedure: INSERTION OF DIALYSIS CATHETER;  Surgeon: Rosetta Posner, MD;  Location: [place];  Service: Vascular;  Laterality: Right;   IRRIGATION AND DEBRIDEMENT ABSCESS  12/21/11   right hip   JOINT REPLACEMENT     KIDNEY TRANSPLANT  07/19/15   LAPAROSCOPIC GASTRIC BANDING  2008   LEFT ATRIAL APPENDAGE OCCLUSION  10/04/2017   OPEN REDUCTION INTERNAL FIXATION (ORIF) DISTAL RADIAL FRACTURE Right 10/18/2016   Procedure: OPEN REDUCTION INTERNAL FIXATION (ORIF) RIGHT DISTAL RADIAL FRACTURE;  Surgeon: Marchia Bond, MD;  Location: [place];  Service: Orthopedics;  Laterality: Right;   SUBMUCOSAL TATTOO INJECTION  07/10/2021   Procedure: SUBMUCOSAL TATTOO INJECTION;  Surgeon: Irving Copas., MD;  Location: [facility];  Service: Gastroenterology;;   TOTAL HIP ARTHROPLASTY Right 02/2011   right THA 02/2011, I&D/removal of hardware 09/2011,, repeat I&D Jun 2013, reimplantation R THA 03-26-2012   TOTAL HIP REVISION  03/25/2012   Procedure: TOTAL HIP REVISION;  Surgeon: [name], MD;  Location: [facility];  Service: Orthopedics;  Laterality: Right;  Right Total Hip Reimplantation   TUBAL LIGATION     VENA CAVA FILTER PLACEMENT  11/2011    Social History:  Social History   Socioeconomic History   Marital status: Married    Spouse name: Not on  file   Number of children: Not on file   Years of education: Not on file   Highest education level: Not on file  Occupational History   Occupation: retired  Tobacco Use   Smoking status: Never   Smokeless tobacco: Never  Vaping Use   Vaping Use: Never used  Substance and Sexual Activity   Alcohol use: Never   Drug use: Never  Sexual activity: Yes    Birth control/protection: Post-menopausal  Other Topics Concern   Not on file  Social History Narrative   Married; retired; lives in [place] Strain: Not on file  Food Insecurity: Not on file  Transportation Needs: Not on file  Physical Activity: Not on file  Stress: Not on file  Social Connections: Not on file  Intimate Partner Violence: Not on file    Family History:  Family History  Problem Relation Age of Onset   Cancer Father    Diabetes Mother    Hypertension Mother    Arthritis Mother    Hodgkin's lymphoma Other 44       dscd---HODGKINS DISEASE   Hypertension Brother     Medications:   Current Outpatient Medications on File Prior to Visit  Medication Sig Dispense Refill   acetaminophen (TYLENOL) 500 MG tablet Take 500 mg by mouth every 6 (six) hours as needed for mild pain or headache.      albuterol (PROVENTIL) (2.5 MG/3ML) 0.083% nebulizer solution Take 3 mLs (2.5 mg total) by nebulization every 6 (six) hours as needed for wheezing or shortness of breath. Dx:J45.909 150 mL 2   albuterol (VENTOLIN HFA) 108 (90 Base) MCG/ACT inhaler INHALE 1-2 PUFFS BY MOUTH EVERY 6 HOURS AS NEEDED FOR WHEEZE OR SHORTNESS OF BREATH 8.5 each 1   apixaban (ELIQUIS) 2.5 MG TABS tablet Take 1 tablet (2.5 mg total) by mouth 2 (two) times daily. 60 tablet    atorvastatin (LIPITOR) 20 MG tablet Take 1 tablet (20 mg total) by mouth daily.     budesonide-formoterol (SYMBICORT) 160-4.5 MCG/ACT inhaler Inhale 2 puffs into the lungs 2 (two) times daily. 1 each 5   carvedilol (COREG)  6.25 MG tablet Take 6.25 mg by mouth 2 (two) times daily.     docusate sodium (COLACE) 100 MG capsule Take 100 mg by mouth at bedtime as needed for mild constipation.     FLUoxetine (PROZAC) 10 MG capsule Take 1 capsule (10 mg total) by mouth daily. 30 capsule 5   furosemide (LASIX) 80 MG tablet Take 80 mg by mouth.     mycophenolate (MYFORTIC) 180 MG EC tablet Take 180 mg by mouth 2 (two) times daily.     ondansetron (ZOFRAN) 4 MG tablet Take 1 tablet (4 mg total) by mouth every 8 (eight) hours as needed for up to 9 doses for nausea. 9 tablet 0   pantoprazole (PROTONIX) 40 MG tablet Take 1 tablet (40 mg total) by mouth 2 (two) times daily for 30 days, THEN 1 tablet (40 mg total) daily. 120 tablet 0   predniSONE (DELTASONE) 5 MG tablet Take 5 mg by mouth daily.     sucralfate (CARAFATE) 1 g tablet Take 1 tablet (1 g total) by mouth 4 (four) times daily. 120 tablet 0   tacrolimus (PROGRAF) 1 MG capsule Take 1 mg by mouth 2 (two) times daily.     No current facility-administered medications on file prior to visit.    Allergies:   Allergies  Allergen Reactions   Warfarin Sodium Other (See Comments)    Caused her to have a stroke/left side of brain to hemorrhage    Ace Inhibitors Cough   Amiodarone Other (See Comments)    Pt with Restrictive Lung Disease by 10/2017 PFT with decreased DLCO   Amlodipine Swelling    Swelling in feet      OBJECTIVE:  Physical Exam  There  were no vitals filed for this visit. There is no height or weight on file to calculate BMI. No results found.  Depression screen PHQ 2/9 08/11/2021  Decreased Interest 2  Down, Depressed, Hopeless 2  PHQ - 2 Score 4  Altered sleeping 2  Tired, decreased energy 2  Change in appetite 2  Feeling bad or failure about yourself  2  Trouble concentrating 2  Moving slowly or fidgety/restless 2  Suicidal thoughts 0  PHQ-9 Score 16  Difficult doing work/chores Not difficult at all  Some recent data might be hidden      General: well developed, well nourished, seated, in no evident distress Head: head normocephalic and atraumatic.   Neck: supple with no carotid or supraclavicular bruits Cardiovascular: regular rate and rhythm, no murmurs Musculoskeletal: no deformity Skin:  no rash/petichiae Vascular:  Normal pulses all extremities   Neurologic Exam Mental Status: Awake and fully alert. Oriented to place and time. Recent and remote memory intact. Attention span, concentration and fund of knowledge appropriate. Mood and affect appropriate.  Cranial Nerves: Fundoscopic exam reveals sharp disc margins. Pupils equal, briskly reactive to light. Extraocular movements full without nystagmus. Visual fields full to confrontation. Hearing intact. Facial sensation intact. Face, tongue, palate moves normally and symmetrically.  Motor: Normal bulk and tone. Normal strength in all tested extremity muscles Sensory.: intact to touch , pinprick , position and vibratory sensation.  Coordination: Rapid alternating movements normal in all extremities. Finger-to-nose and heel-to-shin performed accurately bilaterally. Gait and Station: Arises from chair without difficulty. Stance is normal. Gait demonstrates normal stride length and balance with ***. Tandem walk and heel toe ***.  Reflexes: 1+ and symmetric. Toes downgoing.     NIHSS  *** Modified Rankin  ***      ASSESSMENT: [name] is a 76 y.o. year old female left thalamic bilateral frontal small infarcts on 8/0/9983, embolic likely secondary to A-fib not on [facility]. Vascular risk factors include hx of [place] 2009, A-fib s/p ablation and LAA closure not on AC, AVB s/p PPM, hx of DVT/PE s/p IVC filter, OSA not on CPAP, diastolic CHF and advanced age.      PLAN:  Embolic strokes: Residual deficit: ***. Continue Eliquis 2.5 mg twice daily and atorvastatin 20 mg daily for secondary stroke prevention.  Discussed secondary stroke prevention measures and importance of  close PCP follow up for aggressive stroke risk factor management. I have gone over the pathophysiology of stroke, warning signs and symptoms, risk factors and their management in some detail with instructions to go to the closest emergency room for symptoms of concern. Atrial fibrillation: On Eliquis 2.5 mg twice daily for CHA2DS2-VASc score of at least 6. Managed by cardiology HTN: BP goal <130/90.  Stable on *** per PCP HLD: LDL goal <70. Recent LDL 71.  Continue atorvastatin 20 mg daily     Follow up in *** or call earlier if needed   CC:  [facility] provider: Dr. Leonie Man PCP: Lorayne Marek [name], NP    I spent *** minutes of face-to-face and non-face-to-face time with patient.  This included previsit chart review including review of recent hospitalization, lab review, study review, order entry, electronic health record documentation, patient education regarding recent stroke including etiology, secondary stroke prevention measures and importance of managing stroke risk factors, residual deficits and typical recovery time and answered all other questions to patient satisfaction   Frann Rider, [facility]  [facility] [address] [place], [place] [address]  Phone  [phone] Fax [phone] Note: This document was prepared with digital dictation and possible smart phrase technology. Any transcriptional errors that result from this process are unintentional.

## 2021-09-20 ENCOUNTER — Encounter: Payer: Self-pay | Admitting: Adult Health

## 2021-09-20 ENCOUNTER — Inpatient Hospital Stay: Payer: Medicare Other | Admitting: Adult Health

## 2021-09-20 DIAGNOSIS — D631 Anemia in chronic kidney disease: Secondary | ICD-10-CM | POA: Diagnosis not present

## 2021-09-20 DIAGNOSIS — N185 Chronic kidney disease, stage 5: Secondary | ICD-10-CM | POA: Diagnosis not present

## 2021-09-20 DIAGNOSIS — I132 Hypertensive heart and chronic kidney disease with heart failure and with stage 5 chronic kidney disease, or end stage renal disease: Secondary | ICD-10-CM | POA: Diagnosis not present

## 2021-09-20 DIAGNOSIS — N2581 Secondary hyperparathyroidism of renal origin: Secondary | ICD-10-CM | POA: Diagnosis not present

## 2021-09-20 DIAGNOSIS — I5042 Chronic combined systolic (congestive) and diastolic (congestive) heart failure: Secondary | ICD-10-CM | POA: Diagnosis not present

## 2021-09-20 DIAGNOSIS — I69351 Hemiplegia and hemiparesis following cerebral infarction affecting right dominant side: Secondary | ICD-10-CM | POA: Diagnosis not present

## 2021-09-20 NOTE — Chronic Care Management (AMB) (Signed)
?  Chronic Care Management  ? ?Note ? ?09/20/2021 ?Name: [name] MRN: [id number] DOB: Oct 24, 1945 ? ?[name] is a 76 y.o. year old female who is a primary care patient of [name], [name], NP. [name] is currently enrolled in care management services. An additional referral for RNCM was placed.  ? ?Follow up plan: ?Telephone appointment with care management team member scheduled for: 10/04/2021 ? ?[name], CCMA ?Care Guide, Embedded Care Coordination ?[place]  Care Management  ?Direct Dial: [phone] ? ? ?

## 2021-09-21 DIAGNOSIS — I69351 Hemiplegia and hemiparesis following cerebral infarction affecting right dominant side: Secondary | ICD-10-CM | POA: Diagnosis not present

## 2021-09-21 DIAGNOSIS — N185 Chronic kidney disease, stage 5: Secondary | ICD-10-CM | POA: Diagnosis not present

## 2021-09-21 DIAGNOSIS — I5042 Chronic combined systolic (congestive) and diastolic (congestive) heart failure: Secondary | ICD-10-CM | POA: Diagnosis not present

## 2021-09-21 DIAGNOSIS — D631 Anemia in chronic kidney disease: Secondary | ICD-10-CM | POA: Diagnosis not present

## 2021-09-21 DIAGNOSIS — N2581 Secondary hyperparathyroidism of renal origin: Secondary | ICD-10-CM | POA: Diagnosis not present

## 2021-09-21 DIAGNOSIS — I132 Hypertensive heart and chronic kidney disease with heart failure and with stage 5 chronic kidney disease, or end stage renal disease: Secondary | ICD-10-CM | POA: Diagnosis not present

## 2021-09-22 ENCOUNTER — Telehealth: Payer: Self-pay | Admitting: Nurse Practitioner

## 2021-09-22 DIAGNOSIS — I6310 Cerebral infarction due to embolism of unspecified precerebral artery: Secondary | ICD-10-CM

## 2021-09-22 DIAGNOSIS — I69351 Hemiplegia and hemiparesis following cerebral infarction affecting right dominant side: Secondary | ICD-10-CM

## 2021-09-22 DIAGNOSIS — R479 Unspecified speech disturbances: Secondary | ICD-10-CM

## 2021-09-22 NOTE — Telephone Encounter (Signed)
Pts daughter [name] called and has been trying to get in touch with [facility]. She can't get a response. She is concerned this is not a [place] agency. If it is not she would like a different referral. She stated she would like a quick response as her mother needs this care. ?

## 2021-09-22 NOTE — Telephone Encounter (Signed)
Yes

## 2021-09-25 DIAGNOSIS — N2581 Secondary hyperparathyroidism of renal origin: Secondary | ICD-10-CM | POA: Diagnosis not present

## 2021-09-25 DIAGNOSIS — I69351 Hemiplegia and hemiparesis following cerebral infarction affecting right dominant side: Secondary | ICD-10-CM | POA: Diagnosis not present

## 2021-09-25 DIAGNOSIS — I132 Hypertensive heart and chronic kidney disease with heart failure and with stage 5 chronic kidney disease, or end stage renal disease: Secondary | ICD-10-CM | POA: Diagnosis not present

## 2021-09-25 DIAGNOSIS — I5042 Chronic combined systolic (congestive) and diastolic (congestive) heart failure: Secondary | ICD-10-CM | POA: Diagnosis not present

## 2021-09-25 DIAGNOSIS — N185 Chronic kidney disease, stage 5: Secondary | ICD-10-CM | POA: Diagnosis not present

## 2021-09-25 DIAGNOSIS — D631 Anemia in chronic kidney disease: Secondary | ICD-10-CM | POA: Diagnosis not present

## 2021-09-25 NOTE — Telephone Encounter (Signed)
Patient notified and verbalized understanding. 

## 2021-09-25 NOTE — Telephone Encounter (Signed)
Spoke with patient and she states it is okay to move forward with referral.  ?

## 2021-09-26 DIAGNOSIS — Z20822 Contact with and (suspected) exposure to covid-19: Secondary | ICD-10-CM | POA: Diagnosis not present

## 2021-09-26 DIAGNOSIS — N2581 Secondary hyperparathyroidism of renal origin: Secondary | ICD-10-CM | POA: Diagnosis not present

## 2021-09-26 DIAGNOSIS — I132 Hypertensive heart and chronic kidney disease with heart failure and with stage 5 chronic kidney disease, or end stage renal disease: Secondary | ICD-10-CM | POA: Diagnosis not present

## 2021-09-26 DIAGNOSIS — D631 Anemia in chronic kidney disease: Secondary | ICD-10-CM | POA: Diagnosis not present

## 2021-09-26 DIAGNOSIS — I5042 Chronic combined systolic (congestive) and diastolic (congestive) heart failure: Secondary | ICD-10-CM | POA: Diagnosis not present

## 2021-09-26 DIAGNOSIS — N185 Chronic kidney disease, stage 5: Secondary | ICD-10-CM | POA: Diagnosis not present

## 2021-09-26 DIAGNOSIS — I69351 Hemiplegia and hemiparesis following cerebral infarction affecting right dominant side: Secondary | ICD-10-CM | POA: Diagnosis not present

## 2021-09-27 DIAGNOSIS — N185 Chronic kidney disease, stage 5: Secondary | ICD-10-CM | POA: Diagnosis not present

## 2021-09-27 DIAGNOSIS — I132 Hypertensive heart and chronic kidney disease with heart failure and with stage 5 chronic kidney disease, or end stage renal disease: Secondary | ICD-10-CM | POA: Diagnosis not present

## 2021-09-27 DIAGNOSIS — I69351 Hemiplegia and hemiparesis following cerebral infarction affecting right dominant side: Secondary | ICD-10-CM | POA: Diagnosis not present

## 2021-09-27 DIAGNOSIS — D631 Anemia in chronic kidney disease: Secondary | ICD-10-CM | POA: Diagnosis not present

## 2021-09-27 DIAGNOSIS — I5042 Chronic combined systolic (congestive) and diastolic (congestive) heart failure: Secondary | ICD-10-CM | POA: Diagnosis not present

## 2021-09-27 DIAGNOSIS — N2581 Secondary hyperparathyroidism of renal origin: Secondary | ICD-10-CM | POA: Diagnosis not present

## 2021-09-28 ENCOUNTER — Telehealth: Payer: Self-pay | Admitting: Nurse Practitioner

## 2021-09-28 DIAGNOSIS — N185 Chronic kidney disease, stage 5: Secondary | ICD-10-CM | POA: Diagnosis not present

## 2021-09-28 DIAGNOSIS — I5042 Chronic combined systolic (congestive) and diastolic (congestive) heart failure: Secondary | ICD-10-CM | POA: Diagnosis not present

## 2021-09-28 DIAGNOSIS — I132 Hypertensive heart and chronic kidney disease with heart failure and with stage 5 chronic kidney disease, or end stage renal disease: Secondary | ICD-10-CM | POA: Diagnosis not present

## 2021-09-28 DIAGNOSIS — I69351 Hemiplegia and hemiparesis following cerebral infarction affecting right dominant side: Secondary | ICD-10-CM | POA: Diagnosis not present

## 2021-09-28 DIAGNOSIS — D631 Anemia in chronic kidney disease: Secondary | ICD-10-CM | POA: Diagnosis not present

## 2021-09-28 DIAGNOSIS — N2581 Secondary hyperparathyroidism of renal origin: Secondary | ICD-10-CM | POA: Diagnosis not present

## 2021-09-28 NOTE — Telephone Encounter (Signed)
[name] from [place] [phone] wanted to report that your pt gained 4 lbs over night. Pt is going to make an app with her cardiologist  ?

## 2021-09-29 ENCOUNTER — Ambulatory Visit: Payer: Medicare Other | Admitting: Psychology

## 2021-10-01 DIAGNOSIS — I132 Hypertensive heart and chronic kidney disease with heart failure and with stage 5 chronic kidney disease, or end stage renal disease: Secondary | ICD-10-CM | POA: Diagnosis not present

## 2021-10-01 DIAGNOSIS — K2210 Ulcer of esophagus without bleeding: Secondary | ICD-10-CM | POA: Diagnosis not present

## 2021-10-01 DIAGNOSIS — Z9181 History of falling: Secondary | ICD-10-CM | POA: Diagnosis not present

## 2021-10-01 DIAGNOSIS — G4733 Obstructive sleep apnea (adult) (pediatric): Secondary | ICD-10-CM | POA: Diagnosis not present

## 2021-10-01 DIAGNOSIS — N185 Chronic kidney disease, stage 5: Secondary | ICD-10-CM | POA: Diagnosis not present

## 2021-10-01 DIAGNOSIS — M150 Primary generalized (osteo)arthritis: Secondary | ICD-10-CM | POA: Diagnosis not present

## 2021-10-01 DIAGNOSIS — H9190 Unspecified hearing loss, unspecified ear: Secondary | ICD-10-CM | POA: Diagnosis not present

## 2021-10-01 DIAGNOSIS — I428 Other cardiomyopathies: Secondary | ICD-10-CM | POA: Diagnosis not present

## 2021-10-01 DIAGNOSIS — D631 Anemia in chronic kidney disease: Secondary | ICD-10-CM | POA: Diagnosis not present

## 2021-10-01 DIAGNOSIS — Z7952 Long term (current) use of systemic steroids: Secondary | ICD-10-CM | POA: Diagnosis not present

## 2021-10-01 DIAGNOSIS — N2581 Secondary hyperparathyroidism of renal origin: Secondary | ICD-10-CM | POA: Diagnosis not present

## 2021-10-01 DIAGNOSIS — D849 Immunodeficiency, unspecified: Secondary | ICD-10-CM | POA: Diagnosis not present

## 2021-10-01 DIAGNOSIS — J45909 Unspecified asthma, uncomplicated: Secondary | ICD-10-CM | POA: Diagnosis not present

## 2021-10-01 DIAGNOSIS — F330 Major depressive disorder, recurrent, mild: Secondary | ICD-10-CM | POA: Diagnosis not present

## 2021-10-01 DIAGNOSIS — I5042 Chronic combined systolic (congestive) and diastolic (congestive) heart failure: Secondary | ICD-10-CM | POA: Diagnosis not present

## 2021-10-01 DIAGNOSIS — I739 Peripheral vascular disease, unspecified: Secondary | ICD-10-CM | POA: Diagnosis not present

## 2021-10-01 DIAGNOSIS — Z86718 Personal history of other venous thrombosis and embolism: Secondary | ICD-10-CM | POA: Diagnosis not present

## 2021-10-01 DIAGNOSIS — I4819 Other persistent atrial fibrillation: Secondary | ICD-10-CM | POA: Diagnosis not present

## 2021-10-01 DIAGNOSIS — M1030 Gout due to renal impairment, unspecified site: Secondary | ICD-10-CM | POA: Diagnosis not present

## 2021-10-01 DIAGNOSIS — Z7951 Long term (current) use of inhaled steroids: Secondary | ICD-10-CM | POA: Diagnosis not present

## 2021-10-01 DIAGNOSIS — E7800 Pure hypercholesterolemia, unspecified: Secondary | ICD-10-CM | POA: Diagnosis not present

## 2021-10-01 DIAGNOSIS — I69351 Hemiplegia and hemiparesis following cerebral infarction affecting right dominant side: Secondary | ICD-10-CM | POA: Diagnosis not present

## 2021-10-01 DIAGNOSIS — E46 Unspecified protein-calorie malnutrition: Secondary | ICD-10-CM | POA: Diagnosis not present

## 2021-10-01 DIAGNOSIS — F419 Anxiety disorder, unspecified: Secondary | ICD-10-CM | POA: Diagnosis not present

## 2021-10-01 DIAGNOSIS — Z7901 Long term (current) use of anticoagulants: Secondary | ICD-10-CM | POA: Diagnosis not present

## 2021-10-02 ENCOUNTER — Other Ambulatory Visit: Payer: Self-pay | Admitting: Nurse Practitioner

## 2021-10-02 DIAGNOSIS — I69351 Hemiplegia and hemiparesis following cerebral infarction affecting right dominant side: Secondary | ICD-10-CM | POA: Diagnosis not present

## 2021-10-02 DIAGNOSIS — I5042 Chronic combined systolic (congestive) and diastolic (congestive) heart failure: Secondary | ICD-10-CM | POA: Diagnosis not present

## 2021-10-02 DIAGNOSIS — F331 Major depressive disorder, recurrent, moderate: Secondary | ICD-10-CM

## 2021-10-02 DIAGNOSIS — N2581 Secondary hyperparathyroidism of renal origin: Secondary | ICD-10-CM | POA: Diagnosis not present

## 2021-10-02 DIAGNOSIS — D631 Anemia in chronic kidney disease: Secondary | ICD-10-CM | POA: Diagnosis not present

## 2021-10-02 DIAGNOSIS — N185 Chronic kidney disease, stage 5: Secondary | ICD-10-CM | POA: Diagnosis not present

## 2021-10-02 DIAGNOSIS — I132 Hypertensive heart and chronic kidney disease with heart failure and with stage 5 chronic kidney disease, or end stage renal disease: Secondary | ICD-10-CM | POA: Diagnosis not present

## 2021-10-02 NOTE — Telephone Encounter (Signed)
Pt and husband states they have been putting heating pads on it and this has helped. He states OT will be at their home tomorrow at 8 am and after they assess her if he feels the need for her to call and make a appointment he will contact our office.  ?

## 2021-10-02 NOTE — Telephone Encounter (Signed)
Patient does not have any respiratory distress.  ?Pt is experiencing some pain at the top of both feet, but denies swelling. She would like to know what she should do, states she cannot come in for a appointment because she is unable to walk. Please advise.  ?

## 2021-10-03 DIAGNOSIS — D631 Anemia in chronic kidney disease: Secondary | ICD-10-CM | POA: Diagnosis not present

## 2021-10-03 DIAGNOSIS — N185 Chronic kidney disease, stage 5: Secondary | ICD-10-CM | POA: Diagnosis not present

## 2021-10-03 DIAGNOSIS — I5042 Chronic combined systolic (congestive) and diastolic (congestive) heart failure: Secondary | ICD-10-CM | POA: Diagnosis not present

## 2021-10-03 DIAGNOSIS — I69351 Hemiplegia and hemiparesis following cerebral infarction affecting right dominant side: Secondary | ICD-10-CM | POA: Diagnosis not present

## 2021-10-03 DIAGNOSIS — I132 Hypertensive heart and chronic kidney disease with heart failure and with stage 5 chronic kidney disease, or end stage renal disease: Secondary | ICD-10-CM | POA: Diagnosis not present

## 2021-10-03 DIAGNOSIS — N2581 Secondary hyperparathyroidism of renal origin: Secondary | ICD-10-CM | POA: Diagnosis not present

## 2021-10-03 NOTE — Telephone Encounter (Signed)
[name](pt's daughter) is concerned over which therapist her mom is going to be sent to for her speech.  I see [place], there are three, I was not sure which # to give her. Please advise husband [name] at [phone] ?

## 2021-10-04 ENCOUNTER — Telehealth: Payer: Medicare Other

## 2021-10-04 DIAGNOSIS — I69351 Hemiplegia and hemiparesis following cerebral infarction affecting right dominant side: Secondary | ICD-10-CM | POA: Diagnosis not present

## 2021-10-04 DIAGNOSIS — I132 Hypertensive heart and chronic kidney disease with heart failure and with stage 5 chronic kidney disease, or end stage renal disease: Secondary | ICD-10-CM | POA: Diagnosis not present

## 2021-10-04 DIAGNOSIS — N185 Chronic kidney disease, stage 5: Secondary | ICD-10-CM | POA: Diagnosis not present

## 2021-10-04 DIAGNOSIS — I5042 Chronic combined systolic (congestive) and diastolic (congestive) heart failure: Secondary | ICD-10-CM | POA: Diagnosis not present

## 2021-10-04 DIAGNOSIS — N2581 Secondary hyperparathyroidism of renal origin: Secondary | ICD-10-CM | POA: Diagnosis not present

## 2021-10-04 DIAGNOSIS — D631 Anemia in chronic kidney disease: Secondary | ICD-10-CM | POA: Diagnosis not present

## 2021-10-05 DIAGNOSIS — Z20828 Contact with and (suspected) exposure to other viral communicable diseases: Secondary | ICD-10-CM | POA: Diagnosis not present

## 2021-10-09 DIAGNOSIS — N2581 Secondary hyperparathyroidism of renal origin: Secondary | ICD-10-CM | POA: Diagnosis not present

## 2021-10-09 DIAGNOSIS — I69351 Hemiplegia and hemiparesis following cerebral infarction affecting right dominant side: Secondary | ICD-10-CM | POA: Diagnosis not present

## 2021-10-09 DIAGNOSIS — D631 Anemia in chronic kidney disease: Secondary | ICD-10-CM | POA: Diagnosis not present

## 2021-10-09 DIAGNOSIS — N185 Chronic kidney disease, stage 5: Secondary | ICD-10-CM | POA: Diagnosis not present

## 2021-10-09 DIAGNOSIS — I5042 Chronic combined systolic (congestive) and diastolic (congestive) heart failure: Secondary | ICD-10-CM | POA: Diagnosis not present

## 2021-10-09 DIAGNOSIS — I132 Hypertensive heart and chronic kidney disease with heart failure and with stage 5 chronic kidney disease, or end stage renal disease: Secondary | ICD-10-CM | POA: Diagnosis not present

## 2021-10-10 ENCOUNTER — Encounter: Payer: Self-pay | Admitting: Nurse Practitioner

## 2021-10-10 ENCOUNTER — Telehealth (INDEPENDENT_AMBULATORY_CARE_PROVIDER_SITE_OTHER): Payer: Medicare Other | Admitting: Nurse Practitioner

## 2021-10-10 VITALS — Ht 65.5 in | Wt 109.0 lb

## 2021-10-10 DIAGNOSIS — I69351 Hemiplegia and hemiparesis following cerebral infarction affecting right dominant side: Secondary | ICD-10-CM | POA: Diagnosis not present

## 2021-10-10 DIAGNOSIS — N185 Chronic kidney disease, stage 5: Secondary | ICD-10-CM | POA: Diagnosis not present

## 2021-10-10 DIAGNOSIS — F331 Major depressive disorder, recurrent, moderate: Secondary | ICD-10-CM | POA: Diagnosis not present

## 2021-10-10 DIAGNOSIS — I132 Hypertensive heart and chronic kidney disease with heart failure and with stage 5 chronic kidney disease, or end stage renal disease: Secondary | ICD-10-CM | POA: Diagnosis not present

## 2021-10-10 DIAGNOSIS — D631 Anemia in chronic kidney disease: Secondary | ICD-10-CM | POA: Diagnosis not present

## 2021-10-10 DIAGNOSIS — N2581 Secondary hyperparathyroidism of renal origin: Secondary | ICD-10-CM | POA: Diagnosis not present

## 2021-10-10 DIAGNOSIS — I5042 Chronic combined systolic (congestive) and diastolic (congestive) heart failure: Secondary | ICD-10-CM | POA: Diagnosis not present

## 2021-10-10 NOTE — Progress Notes (Signed)
Virtual Visit via Video Note ? ?I connected withNAME@ on 10/10/21 at 10:40 AM EDT by a video enabled telemedicine application and verified that I am speaking with the correct person using two identifiers. ? ?Location: ?Patient:Home ?Provider: Office ?Participants: patient and provider ? ?I discussed the limitations of evaluation and management by telemedicine and the availability of in person appointments. ?I also discussed with the patient that there may be a patient responsible charge related to this service. The patient expressed understanding and agreed to proceed. ? ?UR:[id number] f/up ? ?History of Present Illness: ?  ?Depression ?Improving mood ?Has appt with therapist 10/11/21 ?Denies and adverse side effects. ? ?Maintain med dose ?F/up in 59month ?Wt Readings from Last 3 Encounters:  ?10/10/21 109 lb (49.4 kg)  ?09/08/21 117 lb (53.1 kg)  ?08/23/21 115 lb (52.2 kg)  ?  ? ?  10/10/2021  ? 10:30 AM 08/11/2021  ?  3:12 PM 07/21/2021  ? 12:16 PM  ?Depression screen PHQ 2/9  ?Decreased Interest '1 2 2  '$ ?Down, Depressed, Hopeless '2 2 2  '$ ?PHQ - 2 Score '3 4 4  '$ ?Altered sleeping 0 2 0  ?Tired, decreased energy '2 2 3  '$ ?Change in appetite '1 2 3  '$ ?Feeling bad or failure about yourself  '3 2 3  '$ ?Trouble concentrating '3 2 1  '$ ?Moving slowly or fidgety/restless 0 2 2  ?Suicidal thoughts 0 0 0  ?PHQ-9 Score '12 16 16  '$ ?Difficult doing work/chores Somewhat difficult Not difficult at all   ?  ? ?  10/10/2021  ? 10:34 AM 07/21/2021  ? 12:17 PM 03/29/2021  ?  3:23 PM 11/03/2020  ? 10:56 AM  ?GAD 7 : Generalized Anxiety Score  ?Nervous, Anxious, on Edge '1 2 1 1  '$ ?Control/stop worrying '1 1 1 '$ 0  ?Worry too much - different things '1 1 1 1  '$ ?Trouble relaxing '1 3 1 1  '$ ?Restless 1 2 0 0  ?Easily annoyed or irritable '1 2 1 1  '$ ?Afraid - awful might happen '1 2 1 1  '$ ?Total GAD 7 Score '7 13 6 5  '$ ?Anxiety Difficulty Somewhat difficult  Somewhat difficult Somewhat difficult  ? ?Observations/Objective: ?Alert and orientedx4 ?Normal mood and  affect ? ?Assessment and Plan: ?[place] seen today for follow-up. ? ?Diagnoses and all orders for this visit: ? ?Moderate episode of recurrent major depressive disorder ([place] ? ? ?Follow Up Instructions: ?See instructions above ?  ?I discussed the assessment and treatment plan with the patient. The patient was provided an opportunity to ask questions and all were answered. The patient agreed with the plan and demonstrated an understanding of the instructions. ?  ?The patient was advised to call back or seek an in-person evaluation if the symptoms worsen or if the condition fails to improve as anticipated. ? ?CWilfred Lacy NP  ?

## 2021-10-10 NOTE — Assessment & Plan Note (Signed)
Improving mood ?Has appt with therapist 10/11/21 ?Denies and adverse side effects. ? ?Maintain med dose ?F/up in 66month?

## 2021-10-11 ENCOUNTER — Ambulatory Visit (INDEPENDENT_AMBULATORY_CARE_PROVIDER_SITE_OTHER): Payer: Medicare Other | Admitting: Psychology

## 2021-10-11 DIAGNOSIS — N2581 Secondary hyperparathyroidism of renal origin: Secondary | ICD-10-CM | POA: Diagnosis not present

## 2021-10-11 DIAGNOSIS — F331 Major depressive disorder, recurrent, moderate: Secondary | ICD-10-CM

## 2021-10-11 DIAGNOSIS — D631 Anemia in chronic kidney disease: Secondary | ICD-10-CM | POA: Diagnosis not present

## 2021-10-11 DIAGNOSIS — I5042 Chronic combined systolic (congestive) and diastolic (congestive) heart failure: Secondary | ICD-10-CM | POA: Diagnosis not present

## 2021-10-11 DIAGNOSIS — I69351 Hemiplegia and hemiparesis following cerebral infarction affecting right dominant side: Secondary | ICD-10-CM | POA: Diagnosis not present

## 2021-10-11 DIAGNOSIS — I132 Hypertensive heart and chronic kidney disease with heart failure and with stage 5 chronic kidney disease, or end stage renal disease: Secondary | ICD-10-CM | POA: Diagnosis not present

## 2021-10-11 DIAGNOSIS — N185 Chronic kidney disease, stage 5: Secondary | ICD-10-CM | POA: Diagnosis not present

## 2021-10-11 NOTE — Progress Notes (Signed)
[facility] Counselor Initial Adult Exam ? ?Name: [name] ?Date: 10/11/2021 ?MRN: [id number] ?DOB: 03-17-1946 ?PCP: Jane Buffy, NP ? ?Time spent: 60 minutes ? ?Guardian/Payee:  None ? ?Paperwork requested: No  ? ?Reason for Visit /Presenting Problem: Patient is experiencing some depression and anxiety related to having a stroke and not being able to do what she used to do.   ? ?Mental Status Exam: ?Appearance:   Casual     ?Behavior:  Appropriate  ?Motor:  Patient in wheelchair  ?Speech/Language:   Normal Rate  ?Affect:  Blunt  ?Mood:  depressed  ?Thought process:  normal  ?Thought content:    WNL  ?Sensory/Perceptual disturbances:    WNL  ?Orientation:  oriented to person, place, time/date, and situation  ?Attention:  Good  ?Concentration:  Good  ?Memory:  WNL  ?Fund of knowledge:   Good  ?Insight:    Fair  ?Judgment:   Fair  ?Impulse Control:  Good  ? ? ?Reported Symptoms:  sadness, crying, difficulty with memory. ?Risk Assessment: ?Danger to Self:  No ?Self-injurious Behavior: No ?Danger to Others: No ?Duty to Warn:no ?Physical Aggression / Violence:No  ?Access to Firearms a concern: No  ?Gang Involvement:No  ?Patient / guardian was educated about steps to take if suicide or homicide risk level increases between visits: yes ?While future psychiatric events cannot be accurately predicted, the patient does not currently require acute inpatient psychiatric care and does not currently meet [facility] involuntary commitment criteria. ? ?Substance Abuse History: ?Current substance abuse: No    ? ?Past Psychiatric History:   ?No previous psychological problems have been observed ?Outpatient Providers:[name] ?History of Psych Hospitalization: No  ?Psychological Testing: N/A ? ?Abuse History:  ?Victim of: No., None ?Report needed: No. ?Victim of Neglect:No. ?Perpetrator of No ?Witness / Exposure to Domestic Violence: NO ?Protective Services Involvement: No  ?Witness to Community  Violence:  No  ? ?Family History:  ?Family History  ?Problem Relation Age of Onset  ? Cancer Father   ? Diabetes Mother   ? Hypertension Mother   ? Arthritis Mother   ? Hodgkin's lymphoma Other 32  ?     dscd---HODGKINS DISEASE  ? Hypertension Brother   ? ? ?Living situation: the patient lives with their spouse ? ?Sexual Orientation: Straight ? ?Relationship Status: married  ?Name of spouse / other:[name] ?If a parent, number of children / ages: 4 grown children ? ?Support Systems: spouse ?children ? ?Financial Stress:  No  ? ?Income/Employment/Disability: Social Security Disability ? ?Military Service: No  ? ?Educational History: ?Education: [employer] ? ?Religion/Sprituality/World View: ?Protestant ? ?Any cultural differences that may affect / interfere with treatment:  not applicable  ? ?Recreation/Hobbies: collecting dolls, jewelry ? ?Stressors: Health problems  , Marriage stress ? ?Strengths: Supportive Relationships, Family, and Church ? ?Barriers:  difficulty with technology, health issues, memory issues ? ?Legal History: ?Pending legal issue / charges: The patient has no significant history of legal issues. ?History of legal issue / charges: None ? ?Medical History/Surgical History: reviewed ?Past Medical History:  ?Diagnosis Date  ? Acute diastolic (congestive) heart failure ([place]) 08/12/2017  ? Acute GI bleeding 11/14/2019  ? Acute kidney injury superimposed on chronic kidney disease ([place]) 11/17/2017  ? AKI (acute kidney injury) ([place]) 11/04/2017  ? Anemia   ? Anxiety   ? ARF (acute renal failure) ([place]) 06/06/2011  ? Arthritis   ? "shoulders; arms; hips" (02/04/2018)  ? Asthma   ? Atrial fibrillation ([place])   ?  Atrial flutter [facility])   ? s/p aflutter ablation at [facility]  ? Bacteriuria, asymptomatic 11/14/2020  ? Benign hypertension with ESRD (end-stage renal disease) ([place])   ? Blood transfusion   ? never had a reaction to blood transfusion  ? Cardiomyopathy Mar 2013  ? Mild, EF 50-55% by Mar 2013 ECHO, diast  dysfxn II  ? Cardiomyopathy ([place])   ? CHF (congestive heart failure) ([place]) 2005  ? CHF (congestive heart failure) ([place])   ? Childhood asthma   ? Closed fracture of right distal radius 10/18/2016  ? CMV (cytomegalovirus infection) ([place]) 10/03/2015  ? Constipation   ? takes Miralax daily  ? Constipation   ? Depression   ? takes Zoloft daily  ? Distal radius fracture, right   ? DVT of lower extremity, bilateral ([place]) 12/21/11  ? "they're there now; been there for 2 wks"  ? Eczema   ? End stage renal disease ([place]) 11/06/2011  ? ESRD (end stage renal disease) on dialysis [facility]) 09/2011  ? 07-19-2015 had Kidney transplant at [facility]; "don't get dialysis anymore" (02/04/2018)  ? Essential hypertension 07/07/2007  ? Qualifier: Diagnosis of  By: Garen Grams    ? Fractures, stress   ? in both feet--6 OR 7 YRS AGO--RESOLVED  ? Generalized edema [id number]  ? GI bleed 11/14/2019  ? Gout   ? doesn't require meds   ? HCAP (healthcare-associated pneumonia) 09/10/2011  ? Hearing difficulty   ? Hematoma of left lower leg 05/17/2020  ? High cholesterol   ? History of CVA (cerebrovascular accident) without residual deficits   ? History of hip replacement, total, right   ? History of pneumonia   ? Hx of Clostridium difficile infection   ? Hypercalcemia   ? 09/10/11  ? Hyperparathyroidism ([place]) 04/07/2013  ? Hypertension   ? takes Diltiazem daily   ? Hypomagnesemia 08/02/2015  ? Hypophosphatemia 11/08/2017  ? ICB (intracranial bleed) ([place])   ? Immunosuppression ([place]) 07/25/2015  ? Memory changes   ? Morbid obesity ([place])   ? Nonischemic dilated cardiomyopathy ([place])   ? Obesity 04/07/2013  ? Oligouria   ? Oral mucositis 02/22/2018  ? OSA on CPAP   ? PAF (paroxysmal atrial fibrillation) ([place])   ?  HX OF CEREBRAL BLEED WHILE ON COUMADIN-SO PT NOT ON ANY BLOOD THINNERS NOW  ? Peripheral vascular disease ([place])   ? Persistent atrial fibrillation ([place]) 08/12/2017  ? Overview:  Added automatically from request for surgery [id number]  Formatting of this note  might be different from the original. Added automatically from request for surgery [id number]  ? Presence of Watchman left atrial appendage closure device 10/04/2017  ? Proteinuria 09/04/2016  ? Psoriasis 08/07/2016  ? 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/25/2015  ? S/P insertion of IVC (inferior vena caval) filter   ? Sepsis ([employer]) 11/08/2017  ? Stress fracture   ? bilateral feet  ? Stroke [facility]) 2009  ? denies residual on 02/04/2018;  hemorrhagic now off coumadin  ? Stroke ([place]) 07/17/2007  ? denies residual, hemorrhagic now off coumadin  ? Stroke due to intracerebral hemorrhage [facility]) 2009  ? Suture reaction 09/04/2016  ? Transfusion history   ? Use of cane as ambulatory aid   ? ? ?Past Surgical History:  ?Procedure Laterality Date  ? AV FISTULA PLACEMENT  09/28/2011  ? Procedure: ARTERIOVENOUS (AV) FISTULA CREATION;  Surgeon: Rosetta Posner, MD;  Location: [facility];  Service: Vascular;  Laterality: Left;  ? BIOPSY  07/10/2021  ? Procedure: BIOPSY;  Surgeon: Rush Landmark,  Telford Nab., MD;  Location: [facility];  Service: Gastroenterology;;  ? CARDIAC CATHETERIZATION    ? [place]  ?  FOR ATRIAL SEPTAL DEFECT  ? CHOLECYSTECTOMY    ? 1980's  ? COLONOSCOPY    ? COLONOSCOPY WITH PROPOFOL N/A 11/16/2019  ? Procedure: COLONOSCOPY WITH PROPOFOL;  Surgeon: Juanita Craver, MD;  Location[facility]COPY;  Service: Endoscopy;  Laterality: N/A;  ? CYSTOSCOPY    ? many yrs ago  ? ESOPHAGOGASTRODUODENOSCOPY N/A 07/10/2021  ? Procedure: ESOPHAGOGASTRODUODENOSCOPY (EGD);  Surgeon: Irving Copas., MD;  Location: [facility];  Service: Gastroenterology;  Laterality: N/A;  ? ESOPHAGOGASTRODUODENOSCOPY (EGD) WITH PROPOFOL N/A 11/16/2019  ? Procedure: ESOPHAGOGASTRODUODENOSCOPY (EGD) WITH PROPOFOL;  Surgeon: Juanita Craver, MD;  Location: [facility] ENDOSCOPY;  Service: Endoscopy;  Laterality: N/A;  ? ESOPHAGOGASTRODUODENOSCOPY (EGD) WITH PROPOFOL N/A 03/20/2021  ? Procedure: ESOPHAGOGASTRODUODENOSCOPY (EGD) WITH PROPOFOL;  Surgeon: Milus Banister, MD;  Location: [facility] ENDOSCOPY;  Service: Endoscopy;  Laterality: N/A;  ? FEMUR FRACTURE SURGERY Right 03/2011; 09/03/2011  ? "had 2, 2 wk apart in 2012; broke it again 08/2011 & had OR"  ? [place]

## 2021-10-12 ENCOUNTER — Telehealth: Payer: Self-pay | Admitting: Nurse Practitioner

## 2021-10-12 DIAGNOSIS — I5042 Chronic combined systolic (congestive) and diastolic (congestive) heart failure: Secondary | ICD-10-CM | POA: Diagnosis not present

## 2021-10-12 DIAGNOSIS — D631 Anemia in chronic kidney disease: Secondary | ICD-10-CM | POA: Diagnosis not present

## 2021-10-12 DIAGNOSIS — N2581 Secondary hyperparathyroidism of renal origin: Secondary | ICD-10-CM | POA: Diagnosis not present

## 2021-10-12 DIAGNOSIS — I132 Hypertensive heart and chronic kidney disease with heart failure and with stage 5 chronic kidney disease, or end stage renal disease: Secondary | ICD-10-CM | POA: Diagnosis not present

## 2021-10-12 DIAGNOSIS — N185 Chronic kidney disease, stage 5: Secondary | ICD-10-CM | POA: Diagnosis not present

## 2021-10-12 DIAGNOSIS — I69351 Hemiplegia and hemiparesis following cerebral infarction affecting right dominant side: Secondary | ICD-10-CM | POA: Diagnosis not present

## 2021-10-12 NOTE — Telephone Encounter (Signed)
Shelda Pal from [facility] is needing a verbal order for speech therapy for pt.  ?1time/3weeks. Please advise Shelda Pal at [phone]. ?

## 2021-10-16 DIAGNOSIS — N185 Chronic kidney disease, stage 5: Secondary | ICD-10-CM | POA: Diagnosis not present

## 2021-10-16 DIAGNOSIS — I120 Hypertensive chronic kidney disease with stage 5 chronic kidney disease or end stage renal disease: Secondary | ICD-10-CM | POA: Diagnosis not present

## 2021-10-16 DIAGNOSIS — D631 Anemia in chronic kidney disease: Secondary | ICD-10-CM | POA: Diagnosis not present

## 2021-10-16 DIAGNOSIS — R809 Proteinuria, unspecified: Secondary | ICD-10-CM | POA: Diagnosis not present

## 2021-10-16 DIAGNOSIS — E876 Hypokalemia: Secondary | ICD-10-CM | POA: Diagnosis not present

## 2021-10-16 DIAGNOSIS — D849 Immunodeficiency, unspecified: Secondary | ICD-10-CM | POA: Diagnosis not present

## 2021-10-16 DIAGNOSIS — N2581 Secondary hyperparathyroidism of renal origin: Secondary | ICD-10-CM | POA: Diagnosis not present

## 2021-10-16 DIAGNOSIS — Z940 Kidney transplant status: Secondary | ICD-10-CM | POA: Diagnosis not present

## 2021-10-16 DIAGNOSIS — K922 Gastrointestinal hemorrhage, unspecified: Secondary | ICD-10-CM | POA: Diagnosis not present

## 2021-10-16 DIAGNOSIS — R894 Abnormal immunological findings in specimens from other organs, systems and tissues: Secondary | ICD-10-CM | POA: Diagnosis not present

## 2021-10-16 DIAGNOSIS — F32A Depression, unspecified: Secondary | ICD-10-CM | POA: Diagnosis not present

## 2021-10-17 ENCOUNTER — Other Ambulatory Visit: Payer: Self-pay

## 2021-10-17 DIAGNOSIS — I132 Hypertensive heart and chronic kidney disease with heart failure and with stage 5 chronic kidney disease, or end stage renal disease: Secondary | ICD-10-CM | POA: Diagnosis not present

## 2021-10-17 DIAGNOSIS — I69351 Hemiplegia and hemiparesis following cerebral infarction affecting right dominant side: Secondary | ICD-10-CM | POA: Diagnosis not present

## 2021-10-17 DIAGNOSIS — D631 Anemia in chronic kidney disease: Secondary | ICD-10-CM | POA: Diagnosis not present

## 2021-10-17 DIAGNOSIS — N185 Chronic kidney disease, stage 5: Secondary | ICD-10-CM | POA: Diagnosis not present

## 2021-10-17 DIAGNOSIS — N2581 Secondary hyperparathyroidism of renal origin: Secondary | ICD-10-CM | POA: Diagnosis not present

## 2021-10-17 DIAGNOSIS — I5042 Chronic combined systolic (congestive) and diastolic (congestive) heart failure: Secondary | ICD-10-CM | POA: Diagnosis not present

## 2021-10-17 NOTE — Patient Outreach (Signed)
[facility]) Care Management ? ?10/17/2021 ? ?[name] ?1946-01-28 ?[id number] ? ? ?Received Emmi call from patient who wanted CM services, filled out engagement tool. Assigned patient to Raina Mina, RN. ? ? ?Philmore Pali ?[place] Management Assistant ?[phone] ? ?

## 2021-10-18 ENCOUNTER — Ambulatory Visit (INDEPENDENT_AMBULATORY_CARE_PROVIDER_SITE_OTHER): Payer: Medicare Other | Admitting: Psychology

## 2021-10-18 ENCOUNTER — Other Ambulatory Visit: Payer: Self-pay | Admitting: *Deleted

## 2021-10-18 DIAGNOSIS — N185 Chronic kidney disease, stage 5: Secondary | ICD-10-CM | POA: Diagnosis not present

## 2021-10-18 DIAGNOSIS — F331 Major depressive disorder, recurrent, moderate: Secondary | ICD-10-CM

## 2021-10-18 DIAGNOSIS — I132 Hypertensive heart and chronic kidney disease with heart failure and with stage 5 chronic kidney disease, or end stage renal disease: Secondary | ICD-10-CM | POA: Diagnosis not present

## 2021-10-18 DIAGNOSIS — I69351 Hemiplegia and hemiparesis following cerebral infarction affecting right dominant side: Secondary | ICD-10-CM | POA: Diagnosis not present

## 2021-10-18 DIAGNOSIS — N2581 Secondary hyperparathyroidism of renal origin: Secondary | ICD-10-CM | POA: Diagnosis not present

## 2021-10-18 DIAGNOSIS — I5042 Chronic combined systolic (congestive) and diastolic (congestive) heart failure: Secondary | ICD-10-CM | POA: Diagnosis not present

## 2021-10-18 DIAGNOSIS — D631 Anemia in chronic kidney disease: Secondary | ICD-10-CM | POA: Diagnosis not present

## 2021-10-18 NOTE — Progress Notes (Signed)
[facility] Counselor/Therapist Progress Note ? ?Patient ID: [name], MRN: [id number],   ? ?Date: 10/18/2021 ? ?Time Spent: 50 minutes ? ?Treatment Type: Individual Therapy ? ?Reported Symptoms: sadness, decreased appetite,  ? ?Mental Status Exam: ?Appearance:  Casual     ?Behavior: Appropriate  ?Motor: Shuffling Gait  ?Speech/Language:  Normal Rate  ?Affect: Appropriate  ?Mood: normal  ?Thought process: normal  ?Thought content:   WNL  ?Sensory/Perceptual disturbances:   WNL  ?Orientation: oriented to person, place, time/date, and situation  ?Attention: Good  ?Concentration: Good  ?Memory: WNL  ?Fund of knowledge:  Good  ?Insight:   Fair  ?Judgment:  Good  ?Impulse Control: Good  ? ?Risk Assessment: ?Danger to Self:  No ?Self-injurious Behavior: No ?Danger to Others: No ?Duty to Warn:no ?Physical Aggression / Violence:No  ?Access to Firearms a concern: No  ?Gang Involvement:No  ? ?Subjective: The patient attended a face-to-face individual therapy session in the office today.  The patient presents as pleasant and cooperative.  The patient looked much better today than she did last week.  She was walking with a walker and she was dressed up.  The patient reports that she feels better because she was able to go to a couple of birthday parties this weekend and also went to the dorm yesterday.  The patient seems to do better when she is able to be more social.  We talked about her taking care of herself and continuing to work on eating more so that she will gain a little of her weight back.  She is struggling with not being interested in food right now. ? ?Interventions: Cognitive Behavioral Therapy and Interpersonal ? ?Diagnosis: Major Depressive disorder, single episode, moderate ? ?Plan: Please see plan in therapy charts with target date of 10/19/2022.  Patient approved this treatment plan. ? ?Azayla Polo G Daneka Lantigua, LCSW ? ? ? ? ? ? ? ? ? ? ? ? ? ? ? ? ? ?Beza Steppe G Mahamadou Weltz, LCSW ?

## 2021-10-18 NOTE — Patient Outreach (Signed)
[facility]) Care Management ? ?10/18/2021 ? ?[name] ?[birth year] ?[id number] ? ?Join EMMI Referral 4/4 ?Initial Outreach: 4/5 ?History: HF/Atrial Fibrillation/HTN ? ?Telephone Assessment-Unsuccessful #1 ? ?RN attempted outreach call today however unsuccessful. RN able to leave a HIPAA approved voice message requesting a call back. ? ?Will send outreach letter requesting a call back. ? ?Raina Mina, RN ?Care Management Coordinator ?[place] ?Main Office [phone]  ?

## 2021-10-19 DIAGNOSIS — D631 Anemia in chronic kidney disease: Secondary | ICD-10-CM | POA: Diagnosis not present

## 2021-10-19 DIAGNOSIS — I69351 Hemiplegia and hemiparesis following cerebral infarction affecting right dominant side: Secondary | ICD-10-CM | POA: Diagnosis not present

## 2021-10-19 DIAGNOSIS — N185 Chronic kidney disease, stage 5: Secondary | ICD-10-CM | POA: Diagnosis not present

## 2021-10-19 DIAGNOSIS — I5042 Chronic combined systolic (congestive) and diastolic (congestive) heart failure: Secondary | ICD-10-CM | POA: Diagnosis not present

## 2021-10-19 DIAGNOSIS — I132 Hypertensive heart and chronic kidney disease with heart failure and with stage 5 chronic kidney disease, or end stage renal disease: Secondary | ICD-10-CM | POA: Diagnosis not present

## 2021-10-19 DIAGNOSIS — N2581 Secondary hyperparathyroidism of renal origin: Secondary | ICD-10-CM | POA: Diagnosis not present

## 2021-10-23 ENCOUNTER — Telehealth: Payer: Self-pay

## 2021-10-23 NOTE — Progress Notes (Signed)
? ? ?Chronic Care Management ?Pharmacy Assistant  ? ?Name: [name]  MRN: [id number] DOB: 04-06-1946 ? ?Reason for Encounter: Hypertension Disease State Call. ?  ?Recent office visits:  ?10/10/2021 Wilfred Lacy NP (PCP) No Medication Changes noted, return in 4 weeks ?09/22/2021 Wilfred Lacy NP (PCP)  Ambulatory referral to [place] ?09/13/2021 Wilfred Lacy NP (PCP) Ambulatory referral to Speech Therapy, No medication changes noted ?09/08/2021 Wilfred Lacy NP (PCP) start Fluoxetine 10 mg daily, stop Remeron and Cymbalta , Return in about 4 weeks  ? ?Recent consult visits:  ?10/18/2021 Bambi Cottle LCSW [facility]) Unable to see note ?10/11/2021 Bambi Cottle LCSW [facility]) Unable to see note ? ?Hospital visits:  ?None in previous 6 months ? ?Medications: ?Outpatient Encounter Medications as of 10/23/2021  ?Medication Sig  ? acetaminophen (TYLENOL) 500 MG tablet Take 500 mg by mouth every 6 (six) hours as needed for mild pain or headache.   ? albuterol (PROVENTIL) (2.5 MG/3ML) 0.083% nebulizer solution Take 3 mLs (2.5 mg total) by nebulization every 6 (six) hours as needed for wheezing or shortness of breath. Dx:J45.909  ? albuterol (VENTOLIN HFA) 108 (90 Base) MCG/ACT inhaler INHALE 1-2 PUFFS BY MOUTH EVERY 6 HOURS AS NEEDED FOR WHEEZE OR SHORTNESS OF BREATH  ? apixaban (ELIQUIS) 2.5 MG TABS tablet Take 1 tablet (2.5 mg total) by mouth 2 (two) times daily.  ? atorvastatin (LIPITOR) 20 MG tablet Take 1 tablet (20 mg total) by mouth daily.  ? budesonide-formoterol (SYMBICORT) 160-4.5 MCG/ACT inhaler Inhale 2 puffs into the lungs 2 (two) times daily.  ? carvedilol (COREG) 6.25 MG tablet Take 6.25 mg by mouth 2 (two) times daily.  ? docusate sodium (COLACE) 100 MG capsule Take 100 mg by mouth at bedtime as needed for mild constipation.  ? FLUoxetine (PROZAC) 10 MG capsule TAKE 1 CAPSULE BY MOUTH EVERY DAY  ? furosemide (LASIX) 80 MG tablet Take 80 mg by mouth.  ? mycophenolate (MYFORTIC)  180 MG EC tablet Take 180 mg by mouth 2 (two) times daily.  ? ondansetron (ZOFRAN) 4 MG tablet Take 1 tablet (4 mg total) by mouth every 8 (eight) hours as needed for up to 9 doses for nausea.  ? pantoprazole (PROTONIX) 40 MG tablet Take 1 tablet (40 mg total) by mouth 2 (two) times daily for 30 days, THEN 1 tablet (40 mg total) daily.  ? predniSONE (DELTASONE) 5 MG tablet Take 5 mg by mouth daily.  ? sucralfate (CARAFATE) 1 g tablet Take 1 tablet (1 g total) by mouth 4 (four) times daily.  ? tacrolimus (PROGRAF) 1 MG capsule Take 1 mg by mouth 2 (two) times daily.  ? ?No facility-administered encounter medications on file as of 10/23/2021.  ? ? ?Care Gaps: ?Shingrix Vaccine ?COVID-19 Vaccine (4- Booster) ? ?Star Rating Drugs: ?Atorvastatin 20 mg last filled 08/30/2021 [facility]. ? ?Medication Fill Gaps: ?None ? ?Reviewed chart prior to disease state call. Spoke with patient regarding BP ? ?Recent Office Vitals: ?BP Readings from Last 3 Encounters:  ?09/08/21 114/60  ?08/23/21 124/68  ?08/15/21 121/75  ? ?Pulse Readings from Last 3 Encounters:  ?09/08/21 74  ?08/23/21 78  ?08/15/21 70  ?  ?Wt Readings from Last 3 Encounters:  ?10/10/21 109 lb (49.4 kg)  ?09/08/21 117 lb (53.1 kg)  ?08/23/21 115 lb (52.2 kg)  ?  ? ?Kidney Function ?Lab Results  ?Component Value Date/Time  ? CREATININE 3.24 (H) 07/26/2021 03:37 AM  ? CREATININE 3.12 (H) 07/25/2021 06:20 AM  ?  GFR 13.63 (LL) 07/21/2021 12:01 PM  ? GFRNONAA 15 (L) 07/26/2021 03:37 AM  ? GFRAA 24 (L) 03/13/2020 11:18 AM  ? ? ? ?  Latest Ref Rng & Units 07/26/2021  ?  3:37 AM 07/25/2021  ?  6:20 AM 07/24/2021  ?  5:15 AM  ?BMP  ?Glucose 70 - 99 mg/dL 86   98   92    ?BUN 8 - 23 mg/dL '30   31   31    '$ ?Creatinine 0.44 - 1.00 mg/dL 3.24   3.12   3.18    ?Sodium 135 - 145 mmol/L 139   140   139    ?Potassium 3.5 - 5.1 mmol/L 4.0   4.1   2.8    ?Chloride 98 - 111 mmol/L 113   112   110    ?CO2 22 - 32 mmol/L '18   22   20    '$ ?Calcium 8.9 - 10.3 mg/dL 8.4   8.3    8.4    ? ? ?Current antihypertensive regimen:  ?None ? ?How often are you checking your Blood Pressure? infrequently ? ?Current home BP readings:  ?Patient states her blood pressure is "good; but unsure of any readings as she does not keep a blood pressure log. ? ?What recent interventions/DTPs have been made by any provider to improve Blood Pressure control since last CPP Visit: None ID ? ?Any recent hospitalizations or ED visits since last visit with CPP? No ? ?What diet changes have been made to improve Blood Pressure Control?  ?Patient denies any changes to her diet. ? ?What exercise is being done to improve your Blood Pressure Control?  ?Patient reports she exercise every other day by walking. ? ?Adherence Review: ?Is the patient currently on ACE/ARB medication? No ?Does the patient have >5 day gap between last estimated fill dates? No ? ?Patient agreed to schedule a telephone appointment with the clinical pharmacist on 11/23/2021 at 8:30 am since it has been over a year. ?  ?Anderson Malta ?Clinical Pharmacist Assistant ?[phone]  ? ? ?

## 2021-10-24 ENCOUNTER — Encounter: Payer: Self-pay | Admitting: *Deleted

## 2021-10-24 ENCOUNTER — Other Ambulatory Visit: Payer: Self-pay | Admitting: *Deleted

## 2021-10-24 DIAGNOSIS — I132 Hypertensive heart and chronic kidney disease with heart failure and with stage 5 chronic kidney disease, or end stage renal disease: Secondary | ICD-10-CM | POA: Diagnosis not present

## 2021-10-24 DIAGNOSIS — D631 Anemia in chronic kidney disease: Secondary | ICD-10-CM | POA: Diagnosis not present

## 2021-10-24 DIAGNOSIS — I69351 Hemiplegia and hemiparesis following cerebral infarction affecting right dominant side: Secondary | ICD-10-CM | POA: Diagnosis not present

## 2021-10-24 DIAGNOSIS — N2581 Secondary hyperparathyroidism of renal origin: Secondary | ICD-10-CM | POA: Diagnosis not present

## 2021-10-24 DIAGNOSIS — I5042 Chronic combined systolic (congestive) and diastolic (congestive) heart failure: Secondary | ICD-10-CM | POA: Diagnosis not present

## 2021-10-24 DIAGNOSIS — N185 Chronic kidney disease, stage 5: Secondary | ICD-10-CM | POA: Diagnosis not present

## 2021-10-24 NOTE — Patient Outreach (Signed)
?[facility]) Care Management ?Telephonic RN Care Manager Note ? ? ?10/24/2021 ?Name:  [name] MRN:  [id number] DOB:  1945-12-02 ? ?Summary: ?Pt enrolled into the HF program and services via [facility]. Pt educated accordingly and will be provided a scale for daily weights and document all weights accordingly. Pt recent completed HHPT and continues to receive Speech therapy. No acute needs however all information documented within the plan of care for ongoing case management needs. ? ?Recommendations/Changes made from today's visit: ?Will stress the importance of daily weights and encouraged daily weights with parameters to call her provider with fluid retention. Will follow up in a few weeks for ongoing HF management of care. ? ?Subjective: ?[name] is an 76 y.o. year old female who is a primary patient of [name], [name], NP. The care management team was consulted for assistance with care management and/or care coordination needs.   ? ?Telephonic RN Care Manager completed Telephone Visit today. ? ?Objective:  ? ?Medications Reviewed Today   ? ? Reviewed by Tobi Bastos, RN (Registered Nurse) on 10/24/21 at 1233  Med List Status: <None>  ? ?Medication Order Taking? Sig Documenting Provider Last Dose Status Informant  ?acetaminophen (TYLENOL) 500 MG tablet [id number] Yes Take 500 mg by mouth every 6 (six) hours as needed for mild pain or headache.  [provider] Taking Active Self  ?albuterol (PROVENTIL) (2.5 MG/3ML) 0.083% nebulizer solution [id number] Yes Take 3 mLs (2.5 mg total) by nebulization every 6 (six) hours as needed for wheezing or shortness of breath. Dx:J45.909 [name], [name], NP Taking Active Self  ?albuterol (VENTOLIN HFA) 108 (90 Base) MCG/ACT inhaler [id number] Yes INHALE 1-2 PUFFS BY MOUTH EVERY 6 HOURS AS NEEDED FOR WHEEZE OR SHORTNESS OF BREATH [name], [name], NP Taking Active   ?apixaban (ELIQUIS) 2.5 MG TABS tablet [id number] No Take 1 tablet (2.5  mg total) by mouth 2 (two) times daily.  ?Patient not taking: Reported on 10/24/2021  ? Shelly Coss, MD Not Taking Active   ?atorvastatin (LIPITOR) 20 MG tablet [id number] Yes Take 1 tablet (20 mg total) by mouth daily. Shelly Coss, MD Taking Active   ?budesonide-formoterol [facility]) 160-4.5 MCG/ACT inhaler [id number] Yes Inhale 2 puffs into the lungs 2 (two) times daily.  ?Patient taking differently: Inhale 2 puffs into the lungs 2 (two) times daily. Takes differently as needed  ? [name], MD Taking Active Self  ?calcitRIOL (ROCALTROL) 0.25 MCG capsule [id number] Yes Take 0.25 mcg by mouth every other day. Takes M-W-F only [provider] Taking Active   ?carvedilol (COREG) 6.25 MG tablet [id number] Yes Take 6.25 mg by mouth 2 (two) times daily. [provider] Taking Active Self  ?docusate sodium (COLACE) 100 MG capsule [id number] Yes Take 100 mg by mouth at bedtime as needed for mild constipation. [provider] Taking Active Self  ?         ?Med Note Burney Gauze R   [facility] Mar 12, 2020 10:40 AM)    ?DULoxetine (CYMBALTA) 30 MG capsule [id number] Yes Take 30 mg by mouth daily. [provider] Taking Active   ?FLUoxetine (PROZAC) 10 MG capsule [id number] Yes TAKE 1 CAPSULE BY MOUTH EVERY DAY [name], [name], NP Taking Active   ?furosemide (LASIX) 80 MG tablet [id number] Yes Take 80 mg by mouth. [provider] Taking Active   ?mycophenolate (MYFORTIC) 180 MG EC tablet [id number] Yes Take 180 mg by mouth 2 (two) times daily. [provider] Taking  Active Self  ?         ?Med Note [name], MACI D   Thu Oct 22, 2019  5:55 PM)    ?ondansetron (ZOFRAN) 4 MG tablet [id number] No Take 1 tablet (4 mg total) by mouth every 8 (eight) hours as needed for up to 9 doses for nausea.  ?Patient not taking: Reported on 10/24/2021  ? Jane Florence., MD Not Taking Active Self  ?pantoprazole (PROTONIX) 40 MG tablet [id number]  Take 1 tablet (40 mg total) by mouth  2 (two) times daily for 30 days, THEN 1 tablet (40 mg total) daily. Jane Riding, MD  Expired 10/11/21 2359 Self  ?         ?Med Note (Avaneesh Pepitone D   Tue Oct 24, 2021 12:23 PM) Refilled and patient is taking this medicaton  ?potassium chloride (KLOR-CON) 10 MEQ tablet [id number] Yes Take 10 mEq by mouth daily. [provider] Taking Active   ?predniSONE (DELTASONE) 5 MG tablet [id number] Yes Take 5 mg by mouth daily. [provider] Taking Active Self  ?sucralfate (CARAFATE) 1 g tablet [id number]  Take 1 tablet (1 g total) by mouth 4 (four) times daily. Jane Riding, MD  Expired 08/12/21 2359 Self  ?tacrolimus (PROGRAF) 1 MG capsule [id number] Yes Take 1 mg by mouth 2 (two) times daily. [provider] Taking Active Self  ? ?  ?  ? ?  ? ? ? ?SDOH:  (Social Determinants of Health) assessments and interventions performed:  ?SDOH Interventions   ? ?Flowsheet Row Most Recent Value  ?SDOH Interventions   ?Food Insecurity Interventions Intervention Not Indicated  ?Transportation Interventions Intervention Not Indicated  ?Depression Interventions/Treatment  Counseling, Medication, Currently on Treatment  ? ?  ? ? ? ?Care Plan ? ?Review of patient past medical history, allergies, medications, health status, including review of consultants reports, laboratory and other test data, was performed as part of comprehensive evaluation for care management services.  ? ?Care Plan : RN RN Care Manager plan of care  ?Updates made by Tobi Bastos, RN since 10/24/2021 12:00 AM  ?  ? ?Problem: Knowledge deficit related to Heart Failure and care coordination needs   ?Priority: High  ?  ? ?Long-Range Goal: Development plan of care for management of Heart Failure   ?Start Date: 10/24/2021  ?Expected End Date: 05/15/2022  ?Priority: High  ?Note:   ?Current Barriers:  ?Knowledge Deficits related to plan of care for management of CHF  ? ?RNCM Clinical Goal(s):  ?Patient will verbalize basic understanding of  CHF  disease process and self health management plan as evidenced by self report and chart review ?take all medications exactly as prescribed and will call provider for medication related questions as evidenced by self report and chart review  through collaboration with RN Care manager, provider, and care team.  ? ?Interventions: ?Inter-disciplinary care team collaboration (see longitudinal plan of care) ?Evaluation of current treatment plan related to  self management and patient's adherence to plan as established by provider ? ? ?Heart Failure Interventions:  (Status:  New goal.) Long Term Goal ?Basic overview and discussion of pathophysiology of Heart Failure reviewed ?Provided education on low sodium diet ?Reviewed Heart Failure Action Plan in depth and provided written copy ?Assessed need for readable accurate scales in home ?Provided education about placing scale on hard, flat surface ?Advised patient to weigh each morning after emptying bladder ?Discussed importance of daily weight and advised patient to weigh and record daily ?  Reviewed role of diuretics in prevention of fluid overload and management of heart failure; ?Discussed the importance of keeping all appointments with provider ?Provided patient with education about the role of exercise in the management of heart failure ?Advised patient to discuss ongoing limitation with exercises for Silver Sneakers at the gym with provider ?Screening for signs and symptoms of depression related to chronic disease state  ? ?Patient Goals/Self-Care Activities: ?Take all medications as prescribed ?Attend all scheduled provider appointments ?Call pharmacy for medication refills 3-7 days in advance of running out of medications ?Attend church or other social activities ?Perform all self care activities independently  ?Perform IADL's (shopping, preparing meals, housekeeping, managing finances) independently ?Call provider office for new concerns or questions  ?call office if I  gain more than 2 pounds in one day or 5 pounds in one week ?do ankle pumps when sitting ?keep legs up while sitting ?track weight in diary ?use salt in moderation ?watch for swelling in feet, ankles

## 2021-10-24 NOTE — Patient Instructions (Signed)
Visit Information ? ?Thank you for taking time to visit with me today. Please don't hesitate to contact me if I can be of assistance to you before our next scheduled telephone appointment. ? ?Following are the goals we discussed today:  ? Take all medications as prescribed ?Attend all scheduled provider appointments ?Call pharmacy for medication refills 3-7 days in advance of running out of medications ?Attend church or other social activities ?Perform all self care activities independently  ?Perform IADL's (shopping, preparing meals, housekeeping, managing finances) independently ?Call provider office for new concerns or questions  ?call office if I gain more than 2 pounds in one day or 5 pounds in one week ?do ankle pumps when sitting ?keep legs up while sitting ?track weight in diary ?use salt in moderation ?watch for swelling in feet, ankles and legs every day ?weigh myself daily ?begin a heart failure diary ?bring diary to all appointments ?develop a rescue plan ?follow rescue plan if symptoms flare-up ?eat more whole grains, fruits and vegetables, lean meats and healthy fats ?know when to call the doctor:3 lbs overnight and 5 lbs within one week ?track symptoms and what helps feel better or worse ?dress right for the weather, hot or cold ?

## 2021-10-25 ENCOUNTER — Telehealth: Payer: Self-pay | Admitting: Nurse Practitioner

## 2021-10-25 ENCOUNTER — Telehealth: Payer: Medicare Other

## 2021-10-25 DIAGNOSIS — I480 Paroxysmal atrial fibrillation: Secondary | ICD-10-CM

## 2021-10-25 DIAGNOSIS — N185 Chronic kidney disease, stage 5: Secondary | ICD-10-CM | POA: Diagnosis not present

## 2021-10-25 DIAGNOSIS — N2581 Secondary hyperparathyroidism of renal origin: Secondary | ICD-10-CM | POA: Diagnosis not present

## 2021-10-25 DIAGNOSIS — D631 Anemia in chronic kidney disease: Secondary | ICD-10-CM | POA: Diagnosis not present

## 2021-10-25 DIAGNOSIS — I5042 Chronic combined systolic (congestive) and diastolic (congestive) heart failure: Secondary | ICD-10-CM | POA: Diagnosis not present

## 2021-10-25 DIAGNOSIS — I132 Hypertensive heart and chronic kidney disease with heart failure and with stage 5 chronic kidney disease, or end stage renal disease: Secondary | ICD-10-CM | POA: Diagnosis not present

## 2021-10-25 DIAGNOSIS — I69351 Hemiplegia and hemiparesis following cerebral infarction affecting right dominant side: Secondary | ICD-10-CM | POA: Diagnosis not present

## 2021-10-25 NOTE — Telephone Encounter (Signed)
[name] called requesting a generic of Eliquis ?

## 2021-10-25 NOTE — Telephone Encounter (Signed)
Mary from [facility] is wanting Baldo Ash to know--- pt is no longer taking apixaban (ELIQUIS) 2.5 MG TABS tablet [[id number]] she can not afford them.  ? ?She is also taking FLUoxetine (PROZAC) 10 MG capsule [[id number]] and Duloxetine. Stanton Kidney is wanting clarification on which one Ailed should be taking. ? ?Please advise Stanton Kidney at [phone] ?

## 2021-10-25 NOTE — Telephone Encounter (Signed)
Verbal orders and form given last week.  ?

## 2021-10-25 NOTE — Telephone Encounter (Signed)
Please advise 

## 2021-10-26 ENCOUNTER — Telehealth: Payer: Self-pay | Admitting: Nurse Practitioner

## 2021-10-26 DIAGNOSIS — Z20822 Contact with and (suspected) exposure to covid-19: Secondary | ICD-10-CM | POA: Diagnosis not present

## 2021-10-26 DIAGNOSIS — D631 Anemia in chronic kidney disease: Secondary | ICD-10-CM | POA: Diagnosis not present

## 2021-10-26 DIAGNOSIS — I5042 Chronic combined systolic (congestive) and diastolic (congestive) heart failure: Secondary | ICD-10-CM | POA: Diagnosis not present

## 2021-10-26 DIAGNOSIS — I132 Hypertensive heart and chronic kidney disease with heart failure and with stage 5 chronic kidney disease, or end stage renal disease: Secondary | ICD-10-CM | POA: Diagnosis not present

## 2021-10-26 DIAGNOSIS — I69351 Hemiplegia and hemiparesis following cerebral infarction affecting right dominant side: Secondary | ICD-10-CM | POA: Diagnosis not present

## 2021-10-26 DIAGNOSIS — N2581 Secondary hyperparathyroidism of renal origin: Secondary | ICD-10-CM | POA: Diagnosis not present

## 2021-10-26 DIAGNOSIS — N185 Chronic kidney disease, stage 5: Secondary | ICD-10-CM | POA: Diagnosis not present

## 2021-10-26 MED ORDER — APIXABAN 2.5 MG PO TABS: 2.5000 mg | ORAL_TABLET | Freq: Two times a day (BID) | ORAL | 1 refills | Status: DC

## 2021-10-26 NOTE — Telephone Encounter (Signed)
[facility] requesting PT twice a week for 4 weeks and once a week for one week.  ? ?Verbal ok, voicemail ok.  ?Meriel FlavorsClaiborne Billings, [id number] ?

## 2021-10-26 NOTE — Telephone Encounter (Signed)
Shelda Pal from [facility] is requesting a verbal order for more speech therapy.she needs [phone]. [phone] ?

## 2021-10-27 ENCOUNTER — Encounter (HOSPITAL_COMMUNITY)
Admission: RE | Admit: 2021-10-27 | Discharge: 2021-10-27 | Disposition: A | Discharge status: Home / Self Care | Payer: Medicare Other | Source: Ambulatory Visit | Attending: Nephrology | Admitting: Nephrology

## 2021-10-27 VITALS — BP 127/62 | HR 71 | Temp 97.5°F | Resp 18

## 2021-10-27 DIAGNOSIS — N185 Chronic kidney disease, stage 5: Secondary | ICD-10-CM | POA: Diagnosis not present

## 2021-10-27 LAB — IRON AND TIBC
Iron: 62 ug/dL (ref 28–170)
Saturation Ratios: 33 % — ABNORMAL HIGH (ref 10.4–31.8)
TIBC: 186 ug/dL — ABNORMAL LOW (ref 250–450)
UIBC: 124 ug/dL

## 2021-10-27 LAB — POCT HEMOGLOBIN-HEMACUE: Hemoglobin: 9.5 g/dL — ABNORMAL LOW (ref 12.0–15.0)

## 2021-10-27 LAB — FERRITIN: Ferritin: 305 ng/mL (ref 11–307)

## 2021-10-27 MED ORDER — EPOETIN ALFA-EPBX 10000 UNIT/ML IJ SOLN
10000.0000 [IU] | INTRAMUSCULAR | Status: DC
  Administered 2021-10-27: 10000 [IU] via SUBCUTANEOUS

## 2021-10-27 MED ORDER — EPOETIN ALFA-EPBX 10000 UNIT/ML IJ SOLN
INTRAMUSCULAR | Status: AC
  Filled 2021-10-27: qty 1

## 2021-10-27 NOTE — Telephone Encounter (Signed)
Verbal orders given  

## 2021-10-30 DIAGNOSIS — Z20822 Contact with and (suspected) exposure to covid-19: Secondary | ICD-10-CM | POA: Diagnosis not present

## 2021-10-31 ENCOUNTER — Ambulatory Visit (INDEPENDENT_AMBULATORY_CARE_PROVIDER_SITE_OTHER): Payer: Medicare Other | Admitting: Psychology

## 2021-10-31 DIAGNOSIS — Z7951 Long term (current) use of inhaled steroids: Secondary | ICD-10-CM | POA: Diagnosis not present

## 2021-10-31 DIAGNOSIS — I69351 Hemiplegia and hemiparesis following cerebral infarction affecting right dominant side: Secondary | ICD-10-CM | POA: Diagnosis not present

## 2021-10-31 DIAGNOSIS — N2581 Secondary hyperparathyroidism of renal origin: Secondary | ICD-10-CM | POA: Diagnosis not present

## 2021-10-31 DIAGNOSIS — E7800 Pure hypercholesterolemia, unspecified: Secondary | ICD-10-CM | POA: Diagnosis not present

## 2021-10-31 DIAGNOSIS — M1030 Gout due to renal impairment, unspecified site: Secondary | ICD-10-CM | POA: Diagnosis not present

## 2021-10-31 DIAGNOSIS — I5042 Chronic combined systolic (congestive) and diastolic (congestive) heart failure: Secondary | ICD-10-CM | POA: Diagnosis not present

## 2021-10-31 DIAGNOSIS — D631 Anemia in chronic kidney disease: Secondary | ICD-10-CM | POA: Diagnosis not present

## 2021-10-31 DIAGNOSIS — E46 Unspecified protein-calorie malnutrition: Secondary | ICD-10-CM | POA: Diagnosis not present

## 2021-10-31 DIAGNOSIS — Z86718 Personal history of other venous thrombosis and embolism: Secondary | ICD-10-CM | POA: Diagnosis not present

## 2021-10-31 DIAGNOSIS — Z7901 Long term (current) use of anticoagulants: Secondary | ICD-10-CM | POA: Diagnosis not present

## 2021-10-31 DIAGNOSIS — G4733 Obstructive sleep apnea (adult) (pediatric): Secondary | ICD-10-CM | POA: Diagnosis not present

## 2021-10-31 DIAGNOSIS — I428 Other cardiomyopathies: Secondary | ICD-10-CM | POA: Diagnosis not present

## 2021-10-31 DIAGNOSIS — Z7952 Long term (current) use of systemic steroids: Secondary | ICD-10-CM | POA: Diagnosis not present

## 2021-10-31 DIAGNOSIS — I132 Hypertensive heart and chronic kidney disease with heart failure and with stage 5 chronic kidney disease, or end stage renal disease: Secondary | ICD-10-CM | POA: Diagnosis not present

## 2021-10-31 DIAGNOSIS — D849 Immunodeficiency, unspecified: Secondary | ICD-10-CM | POA: Diagnosis not present

## 2021-10-31 DIAGNOSIS — F331 Major depressive disorder, recurrent, moderate: Secondary | ICD-10-CM | POA: Diagnosis not present

## 2021-10-31 DIAGNOSIS — K2210 Ulcer of esophagus without bleeding: Secondary | ICD-10-CM | POA: Diagnosis not present

## 2021-10-31 DIAGNOSIS — J45909 Unspecified asthma, uncomplicated: Secondary | ICD-10-CM | POA: Diagnosis not present

## 2021-10-31 DIAGNOSIS — F330 Major depressive disorder, recurrent, mild: Secondary | ICD-10-CM | POA: Diagnosis not present

## 2021-10-31 DIAGNOSIS — N185 Chronic kidney disease, stage 5: Secondary | ICD-10-CM | POA: Diagnosis not present

## 2021-10-31 DIAGNOSIS — F419 Anxiety disorder, unspecified: Secondary | ICD-10-CM | POA: Diagnosis not present

## 2021-10-31 DIAGNOSIS — H9190 Unspecified hearing loss, unspecified ear: Secondary | ICD-10-CM | POA: Diagnosis not present

## 2021-10-31 DIAGNOSIS — M150 Primary generalized (osteo)arthritis: Secondary | ICD-10-CM | POA: Diagnosis not present

## 2021-10-31 DIAGNOSIS — I4819 Other persistent atrial fibrillation: Secondary | ICD-10-CM | POA: Diagnosis not present

## 2021-10-31 DIAGNOSIS — I739 Peripheral vascular disease, unspecified: Secondary | ICD-10-CM | POA: Diagnosis not present

## 2021-10-31 DIAGNOSIS — Z9181 History of falling: Secondary | ICD-10-CM | POA: Diagnosis not present

## 2021-10-31 NOTE — Progress Notes (Signed)
[place] Counselor/Therapist Progress Note ? ?Patient ID: [name], MRN: [id number],   ? ?Date: 10/31/2021 ? ?Time Spent: 60 minutes ? ?Treatment Type: Individual Therapy ? ?Reported Symptoms: sadness, decreased appetite,  ? ?Mental Status Exam: ?Appearance:  Casual     ?Behavior: Appropriate  ?Motor: Shuffling Gait  ?Speech/Language:  Normal Rate  ?Affect: Appropriate  ?Mood: normal  ?Thought process: normal  ?Thought content:   WNL  ?Sensory/Perceptual disturbances:   WNL  ?Orientation: oriented to person, place, time/date, and situation  ?Attention: Good  ?Concentration: Good  ?Memory: WNL  ?Fund of knowledge:  Good  ?Insight:   Fair  ?Judgment:  Good  ?Impulse Control: Good  ? ?Risk Assessment: ?Danger to Self:  No ?Self-injurious Behavior: No ?Danger to Others: No ?Duty to Warn:no ?Physical Aggression / Violence:No  ?Access to Firearms a concern: No  ?Gang Involvement:No  ? ?Subjective: The patient attended a face-to-face individual therapy session in the office today.  The patient presents as pleasant and cooperative, but tired.  The patient reports that she has been very busy today and is tired.  The patient states that they have run several hours errands and had appointments.  The patient came in using her walker and reports that she is pushing herself a little bit to do more of this so that she can not have to use the wheelchair at all.  The patient seems to be in relatively good spirits however she is just tired.  She reports that she feels better in regard to the depression and feels like she is making some progress in regard to improvement from her stroke.  Encouraged the patient to continue to be positive and to continue to work towards recovery. ?Interventions: Cognitive Behavioral Therapy and Interpersonal ? ?Diagnosis: Major Depressive disorder, single episode, moderate ? ?Plan: Please see plan in therapy charts with target date of 10/19/2022.  Patient approved this treatment  plan. ? ?Finlay Godbee G Dell Hurtubise, LCSW ? ? ? ? ? ? ? ? ? ? ? ? ? ? ? ? ? ?Garwood Wentzell G Braeton Wolgamott, LCSW ? ? ? ? ? ? ? ? ? ? ? ? ? ? ?Royann Wildasin G Tenessa Marsee, LCSW ?

## 2021-11-01 DIAGNOSIS — D631 Anemia in chronic kidney disease: Secondary | ICD-10-CM | POA: Diagnosis not present

## 2021-11-01 DIAGNOSIS — N2581 Secondary hyperparathyroidism of renal origin: Secondary | ICD-10-CM | POA: Diagnosis not present

## 2021-11-01 DIAGNOSIS — M1030 Gout due to renal impairment, unspecified site: Secondary | ICD-10-CM | POA: Diagnosis not present

## 2021-11-01 DIAGNOSIS — I5042 Chronic combined systolic (congestive) and diastolic (congestive) heart failure: Secondary | ICD-10-CM | POA: Diagnosis not present

## 2021-11-01 DIAGNOSIS — N185 Chronic kidney disease, stage 5: Secondary | ICD-10-CM | POA: Diagnosis not present

## 2021-11-01 DIAGNOSIS — I132 Hypertensive heart and chronic kidney disease with heart failure and with stage 5 chronic kidney disease, or end stage renal disease: Secondary | ICD-10-CM | POA: Diagnosis not present

## 2021-11-02 DIAGNOSIS — Z20822 Contact with and (suspected) exposure to covid-19: Secondary | ICD-10-CM | POA: Diagnosis not present

## 2021-11-03 DIAGNOSIS — D631 Anemia in chronic kidney disease: Secondary | ICD-10-CM | POA: Diagnosis not present

## 2021-11-03 DIAGNOSIS — I132 Hypertensive heart and chronic kidney disease with heart failure and with stage 5 chronic kidney disease, or end stage renal disease: Secondary | ICD-10-CM | POA: Diagnosis not present

## 2021-11-03 DIAGNOSIS — I5042 Chronic combined systolic (congestive) and diastolic (congestive) heart failure: Secondary | ICD-10-CM | POA: Diagnosis not present

## 2021-11-03 DIAGNOSIS — M1030 Gout due to renal impairment, unspecified site: Secondary | ICD-10-CM | POA: Diagnosis not present

## 2021-11-03 DIAGNOSIS — N2581 Secondary hyperparathyroidism of renal origin: Secondary | ICD-10-CM | POA: Diagnosis not present

## 2021-11-03 DIAGNOSIS — N185 Chronic kidney disease, stage 5: Secondary | ICD-10-CM | POA: Diagnosis not present

## 2021-11-06 DIAGNOSIS — N2581 Secondary hyperparathyroidism of renal origin: Secondary | ICD-10-CM | POA: Diagnosis not present

## 2021-11-06 DIAGNOSIS — I132 Hypertensive heart and chronic kidney disease with heart failure and with stage 5 chronic kidney disease, or end stage renal disease: Secondary | ICD-10-CM | POA: Diagnosis not present

## 2021-11-06 DIAGNOSIS — N185 Chronic kidney disease, stage 5: Secondary | ICD-10-CM | POA: Diagnosis not present

## 2021-11-06 DIAGNOSIS — I5042 Chronic combined systolic (congestive) and diastolic (congestive) heart failure: Secondary | ICD-10-CM | POA: Diagnosis not present

## 2021-11-06 DIAGNOSIS — M1030 Gout due to renal impairment, unspecified site: Secondary | ICD-10-CM | POA: Diagnosis not present

## 2021-11-06 DIAGNOSIS — D631 Anemia in chronic kidney disease: Secondary | ICD-10-CM | POA: Diagnosis not present

## 2021-11-07 DIAGNOSIS — Z20822 Contact with and (suspected) exposure to covid-19: Secondary | ICD-10-CM | POA: Diagnosis not present

## 2021-11-07 DIAGNOSIS — M1030 Gout due to renal impairment, unspecified site: Secondary | ICD-10-CM | POA: Diagnosis not present

## 2021-11-07 DIAGNOSIS — I132 Hypertensive heart and chronic kidney disease with heart failure and with stage 5 chronic kidney disease, or end stage renal disease: Secondary | ICD-10-CM | POA: Diagnosis not present

## 2021-11-07 DIAGNOSIS — N2581 Secondary hyperparathyroidism of renal origin: Secondary | ICD-10-CM | POA: Diagnosis not present

## 2021-11-07 DIAGNOSIS — D631 Anemia in chronic kidney disease: Secondary | ICD-10-CM | POA: Diagnosis not present

## 2021-11-07 DIAGNOSIS — I5042 Chronic combined systolic (congestive) and diastolic (congestive) heart failure: Secondary | ICD-10-CM | POA: Diagnosis not present

## 2021-11-07 DIAGNOSIS — N185 Chronic kidney disease, stage 5: Secondary | ICD-10-CM | POA: Diagnosis not present

## 2021-11-07 NOTE — Progress Notes (Signed)
?[name] ?[address] ?[place]. [place] [address] ?[phone] ? ?     HOSPITAL FOLLOW UP NOTE ? ?[name] ?Date of Birth:  [birth year] ?Medical Record Number:  [id number]  ? ?Reason for Referral:  hospital stroke follow up ? ? ? ?SUBJECTIVE: ? ? ?CHIEF COMPLAINT:  ?Chief Complaint  ?Patient presents with  ? Follow-up  ?  RM 3 with spouse [name]  ?Pt is well, having weakness in legs. Mentions she doesn't want to get out of bed but due to just not wanting to sit up not related to weakness or anything. Spouse states its just her depression   ? ? ?HPI:  ? ?[name] is a 76 y.o. female with history of hx of ESRD s/p renal transplant in 2017, [place] 2009, Afib s/p ablation and LAA closure not on AC, AVB s/p PPM, DVT/PE s/p IVC filter, duodenal ulcer, diastolic CHF, asthma, OSA, depression and recent hospitalziation for BLA due to GI bleed who presented on 07/23/2021 with RLE weakness, slurred speech, and confusion.  Stroke work-up revealed left thalamus and bilateral frontal small infarcts likely secondary to A-fib not on AC.  MRA head/neck negative LVO or high-grade stenosis.  EF 35 to 40%.  LE Doppler negative for DVT.  LDL 71.  A1c 4.7.  On aspirin PTA and recommended Eliquis 2.5 mg twice daily once cleared by GI.  Initiate atorvastatin 20 mg daily.  Per therapy recommendations, she was discharged to SNF for ongoing therapy needs. ? ? ?Today, 11/08/2021, patient is being seen for initial hospital follow-up accompanied by her husband, [name].  She has since returned home from [place] rehab on 2/15.  Patient notes continued leg weakness R>L.  Currently working with [facility] working on strengthening and ambulation.  Uses cane for ambulation, denies any falls.  She does continue to struggle with depression and at times has difficulty getting herself out of bed but other days feels great and has more energy.  She also notes short-term memory difficulties.  Working with [facility] SLP for  cognitive impairment and mildly slurred speech and occasional word finding difficulty.  Depression currently managed by PCP currently on fluoxetine and routinely follows with psychology.  Denies new stroke/TIA symptoms.  Remains on atorvastatin, denies side effects. Has not been able to start Eliquis due to financial reasons ($400/month).  Has follow-up visit in 2 weeks with cardiologist Dr. Radford Pax.  She is currently taking aspirin 81 mg daily.  She does have hx of ICH on warfarin in the past, husband unsure if she has been on Xarelto previously but medication "sounds familiar" to him.  Blood pressure today 128/75.  Monitors at home which has ben stable. Is not currently using CPAP machine, does not believe she has been using over the past year, reports tolerating CPAP well but has just "been lazy", she feels like she sleeps fine and did not think it was needed.  No further concerns at this time. ? ? ? ? ? ? ?PERTINENT IMAGING ? ?Per hospitalization 07/23/2021 ?Code Stroke CT head No acute abnormality. Advanced chronic ischemic disease as well as small vessel disease in L thalamus and pons. ASPECTS 10.    ?MRI  acute infarct in L thalamus, punctate aute infarcts in bilateral frontal lobes consistent with cardioembolic etiology, moderate chronic micorvascular ischemic disease ?MRA head and neck no LVO or high grade stenosis ?2D Echo EF 35-40% ?Lower Extremity US: no evidence of DVT bilaterally ?LDL 71 ?HgbA1c 4.7 ? ? ? ?  ROS:   ?14 system review of systems performed and negative with exception of those listed in HPI ? ?PMH:  ?Past Medical History:  ?Diagnosis Date  ? Acute diastolic (congestive) heart failure ([place]) 08/12/2017  ? Acute GI bleeding 11/14/2019  ? Acute kidney injury superimposed on chronic kidney disease ([place]) 11/17/2017  ? AKI (acute kidney injury) ([place]) 11/04/2017  ? Anemia   ? Anxiety   ? ARF (acute renal failure) ([place]) 06/06/2011  ? Arthritis   ? "shoulders; arms; hips" (02/04/2018)  ? Asthma   ? Atrial  fibrillation ([place])   ? Atrial flutter [facility])   ? s/p aflutter ablation at [facility]  ? Bacteriuria, asymptomatic 11/14/2020  ? Benign hypertension with ESRD (end-stage renal disease) ([place])   ? Blood transfusion   ? never had a reaction to blood transfusion  ? Cardiomyopathy Mar 2013  ? Mild, EF 50-55% by Mar 2013 ECHO, diast dysfxn II  ? Cardiomyopathy ([place])   ? CHF (congestive heart failure) ([place]) 2005  ? CHF (congestive heart failure) ([place])   ? Childhood asthma   ? Closed fracture of right distal radius 10/18/2016  ? CMV (cytomegalovirus infection) ([place]) 10/03/2015  ? Constipation   ? takes Miralax daily  ? Constipation   ? Depression   ? takes Zoloft daily  ? Distal radius fracture, right   ? DVT of lower extremity, bilateral ([place]) 12/21/11  ? "they're there now; been there for 2 wks"  ? Eczema   ? End stage renal disease ([place]) 11/06/2011  ? ESRD (end stage renal disease) on dialysis [facility]) 09/2011  ? 07-19-2015 had Kidney transplant at [facility]; "don't get dialysis anymore" (02/04/2018)  ? Essential hypertension 07/07/2007  ? Qualifier: Diagnosis of  By: Garen Grams    ? Fractures, stress   ? in both feet--6 OR 7 YRS AGO--RESOLVED  ? Generalized edema [id number]  ? GI bleed 11/14/2019  ? Gout   ? doesn't require meds   ? HCAP (healthcare-associated pneumonia) 09/10/2011  ? Hearing difficulty   ? Hematoma of left lower leg 05/17/2020  ? High cholesterol   ? History of CVA (cerebrovascular accident) without residual deficits   ? History of hip replacement, total, right   ? History of pneumonia   ? Hx of Clostridium difficile infection   ? Hypercalcemia   ? 09/10/11  ? Hyperparathyroidism ([place]) 04/07/2013  ? Hypertension   ? takes Diltiazem daily   ? Hypomagnesemia 08/02/2015  ? Hypophosphatemia 11/08/2017  ? ICB (intracranial bleed) ([place])   ? Immunosuppression ([place]) 07/25/2015  ? Memory changes   ? Morbid obesity ([place])   ? Nonischemic dilated cardiomyopathy ([place])   ? Obesity 04/07/2013  ? Oligouria   ? Oral mucositis 02/22/2018  ? OSA  on CPAP   ? PAF (paroxysmal atrial fibrillation) ([place])   ?  HX OF CEREBRAL BLEED WHILE ON COUMADIN-SO PT NOT ON ANY BLOOD THINNERS NOW  ? Peripheral vascular disease ([employer])   ? Persistent atrial fibrillation ([place]) 08/12/2017  ? Overview:  Added automatically from request for surgery [phone]  Formatting of this note might be different from the original. Added automatically from request for surgery [phone]  ? Presence of Watchman left atrial appendage closure device 10/04/2017  ? Proteinuria 09/04/2016  ? Psoriasis 08/07/2016  ? 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/25/2015  ? S/P insertion of IVC (inferior vena caval) filter   ? Sepsis ([place]) 11/08/2017  ? Stress fracture   ? bilateral feet  ? Stroke [facility]) 2009  ? denies residual  on 02/04/2018;  hemorrhagic now off coumadin  ? Stroke ([place]) 07/17/2007  ? denies residual, hemorrhagic now off coumadin  ? Stroke due to intracerebral hemorrhage [facility]) 2009  ? Suture reaction 09/04/2016  ? Transfusion history   ? Use of cane as ambulatory aid   ? ? ?PSH:  ?Past Surgical History:  ?Procedure Laterality Date  ? AV FISTULA PLACEMENT  09/28/2011  ? Procedure: ARTERIOVENOUS (AV) FISTULA CREATION;  Surgeon: Rosetta Posner, MD;  Location: [place];  Service: Vascular;  Laterality: Left;  ? BIOPSY  07/10/2021  ? Procedure: BIOPSY;  Surgeon: Irving Copas., MD;  Location: [facility];  Service: Gastroenterology;;  ? CARDIAC CATHETERIZATION    ? [place]  ?  FOR ATRIAL SEPTAL DEFECT  ? CHOLECYSTECTOMY    ? 1980's  ? COLONOSCOPY    ? COLONOSCOPY WITH PROPOFOL N/A 11/16/2019  ? Procedure: COLONOSCOPY WITH PROPOFOL;  Surgeon: Juanita Craver, MD;  Location: [facility] ENDOSCOPY;  Service: Endoscopy;  Laterality: N/A;  ? CYSTOSCOPY    ? many yrs ago  ? ESOPHAGOGASTRODUODENOSCOPY N/A 07/10/2021  ? Procedure: ESOPHAGOGASTRODUODENOSCOPY (EGD);  Surgeon: Irving Copas., MD;  Location: [facility];  Service: Gastroenterology;  Laterality: N/A;  ? ESOPHAGOGASTRODUODENOSCOPY (EGD) WITH  PROPOFOL N/A 11/16/2019  ? Procedure: ESOPHAGOGASTRODUODENOSCOPY (EGD) WITH PROPOFOL;  Surgeon: Juanita Craver, MD;  Location: [facility] ENDOSCOPY;  Service: Endoscopy;  Laterality: N/A;  ? ESOPHAGOGASTRODUODENOSCOPY (EGD)

## 2021-11-08 ENCOUNTER — Encounter: Payer: Self-pay | Admitting: Adult Health

## 2021-11-08 ENCOUNTER — Ambulatory Visit (INDEPENDENT_AMBULATORY_CARE_PROVIDER_SITE_OTHER): Payer: Medicare Other | Admitting: Adult Health

## 2021-11-08 VITALS — BP 128/75 | HR 70 | Ht 65.0 in | Wt 117.0 lb

## 2021-11-08 DIAGNOSIS — Z09 Encounter for follow-up examination after completed treatment for conditions other than malignant neoplasm: Secondary | ICD-10-CM

## 2021-11-08 DIAGNOSIS — I480 Paroxysmal atrial fibrillation: Secondary | ICD-10-CM | POA: Diagnosis not present

## 2021-11-08 DIAGNOSIS — G4733 Obstructive sleep apnea (adult) (pediatric): Secondary | ICD-10-CM | POA: Diagnosis not present

## 2021-11-08 DIAGNOSIS — I63423 Cerebral infarction due to embolism of bilateral anterior cerebral arteries: Secondary | ICD-10-CM

## 2021-11-08 NOTE — Patient Instructions (Signed)
Continue working with home health therapies for hopeful ongoing recovery ? ?Continue aspirin '81mg'$  daily and atorvastatin for secondary stroke prevention ? ?Please follow up with cardiology regarding eliquis cost ? ?Highly recommend use of your CPAP machine as untreated sleep apnea increases risk of stroke and heart disease. This can also contribute to memory concerns and underlying depression ? ?Continue to follow closely with cardiology for atrial fibrillation and Eliquis management ? ?Continue to follow with PCP and psychology for underlying depression ? ?Continue to follow up with PCP regarding cholesterol and blood pressure management  ?Maintain strict control of hypertension with blood pressure goal below 130/90 and cholesterol with LDL cholesterol (bad cholesterol) goal below 70 mg/dL.  ? ?Signs of a Stroke? Follow the BEFAST method:  ?Balance Watch for a sudden loss of balance, trouble with coordination or vertigo ?Eyes Is there a sudden loss of vision in one or both eyes? Or double vision?  ?Face: Ask the person to smile. Does one side of the face droop or is it numb?  ?Arms: Ask the person to raise both arms. Does one arm drift downward? Is there weakness or numbness of a leg? ?Speech: Ask the person to repeat a simple phrase. Does the speech sound slurred/strange? Is the person confused ? ?Time: If you observe any of these signs, call 911. ? ? ? ? ?Followup in the future with me in 4 months or call earlier if needed ? ? ? ? ? ? ?Thank you for coming to see [name] at [facility]. I hope we have been able to provide you high quality care today. ? ?You may receive a patient satisfaction survey over the next few weeks. We would appreciate your feedback and comments so that we may continue to improve ourselves and the health of our patients. ? ?

## 2021-11-09 ENCOUNTER — Other Ambulatory Visit (HOSPITAL_COMMUNITY): Payer: Self-pay | Admitting: *Deleted

## 2021-11-09 DIAGNOSIS — I5042 Chronic combined systolic (congestive) and diastolic (congestive) heart failure: Secondary | ICD-10-CM | POA: Diagnosis not present

## 2021-11-09 DIAGNOSIS — N2581 Secondary hyperparathyroidism of renal origin: Secondary | ICD-10-CM | POA: Diagnosis not present

## 2021-11-09 DIAGNOSIS — M1030 Gout due to renal impairment, unspecified site: Secondary | ICD-10-CM | POA: Diagnosis not present

## 2021-11-09 DIAGNOSIS — N185 Chronic kidney disease, stage 5: Secondary | ICD-10-CM | POA: Diagnosis not present

## 2021-11-09 DIAGNOSIS — I132 Hypertensive heart and chronic kidney disease with heart failure and with stage 5 chronic kidney disease, or end stage renal disease: Secondary | ICD-10-CM | POA: Diagnosis not present

## 2021-11-09 DIAGNOSIS — D631 Anemia in chronic kidney disease: Secondary | ICD-10-CM | POA: Diagnosis not present

## 2021-11-10 ENCOUNTER — Encounter (HOSPITAL_COMMUNITY)
Admission: RE | Admit: 2021-11-10 | Discharge: 2021-11-10 | Disposition: A | Discharge status: Home / Self Care | Payer: Medicare Other | Source: Ambulatory Visit | Attending: Nephrology | Admitting: Nephrology

## 2021-11-10 VITALS — BP 133/73 | HR 70 | Temp 97.4°F | Resp 18

## 2021-11-10 DIAGNOSIS — N185 Chronic kidney disease, stage 5: Secondary | ICD-10-CM | POA: Diagnosis not present

## 2021-11-10 LAB — POCT HEMOGLOBIN-HEMACUE: Hemoglobin: 10.2 g/dL — ABNORMAL LOW (ref 12.0–15.0)

## 2021-11-10 MED ORDER — EPOETIN ALFA-EPBX 10000 UNIT/ML IJ SOLN
INTRAMUSCULAR | Status: AC
  Filled 2021-11-10: qty 2

## 2021-11-10 MED ORDER — EPOETIN ALFA-EPBX 10000 UNIT/ML IJ SOLN
20000.0000 [IU] | INTRAMUSCULAR | Status: DC
  Administered 2021-11-10: 20000 [IU] via SUBCUTANEOUS

## 2021-11-13 DIAGNOSIS — I5042 Chronic combined systolic (congestive) and diastolic (congestive) heart failure: Secondary | ICD-10-CM | POA: Diagnosis not present

## 2021-11-13 DIAGNOSIS — M1030 Gout due to renal impairment, unspecified site: Secondary | ICD-10-CM | POA: Diagnosis not present

## 2021-11-13 DIAGNOSIS — N2581 Secondary hyperparathyroidism of renal origin: Secondary | ICD-10-CM | POA: Diagnosis not present

## 2021-11-13 DIAGNOSIS — D631 Anemia in chronic kidney disease: Secondary | ICD-10-CM | POA: Diagnosis not present

## 2021-11-13 DIAGNOSIS — N185 Chronic kidney disease, stage 5: Secondary | ICD-10-CM | POA: Diagnosis not present

## 2021-11-13 DIAGNOSIS — I132 Hypertensive heart and chronic kidney disease with heart failure and with stage 5 chronic kidney disease, or end stage renal disease: Secondary | ICD-10-CM | POA: Diagnosis not present

## 2021-11-14 ENCOUNTER — Ambulatory Visit (INDEPENDENT_AMBULATORY_CARE_PROVIDER_SITE_OTHER): Payer: Medicare Other | Admitting: Nurse Practitioner

## 2021-11-14 ENCOUNTER — Encounter: Payer: Self-pay | Admitting: Nurse Practitioner

## 2021-11-14 VITALS — BP 100/60 | HR 72 | Temp 97.0°F | Ht 65.5 in | Wt 108.0 lb

## 2021-11-14 DIAGNOSIS — I480 Paroxysmal atrial fibrillation: Secondary | ICD-10-CM | POA: Diagnosis not present

## 2021-11-14 DIAGNOSIS — K5900 Constipation, unspecified: Secondary | ICD-10-CM

## 2021-11-14 DIAGNOSIS — I5042 Chronic combined systolic (congestive) and diastolic (congestive) heart failure: Secondary | ICD-10-CM

## 2021-11-14 DIAGNOSIS — J8410 Pulmonary fibrosis, unspecified: Secondary | ICD-10-CM | POA: Insufficient documentation

## 2021-11-14 DIAGNOSIS — K219 Gastro-esophageal reflux disease without esophagitis: Secondary | ICD-10-CM | POA: Diagnosis not present

## 2021-11-14 DIAGNOSIS — F331 Major depressive disorder, recurrent, moderate: Secondary | ICD-10-CM | POA: Diagnosis not present

## 2021-11-14 MED ORDER — LACTULOSE 10 GM/15ML PO SOLN: 20.0000 g | Freq: Every day | ORAL | 0 refills | Status: AC | PRN

## 2021-11-14 MED ORDER — FLUOXETINE HCL 20 MG PO CAPS: 20.0000 mg | ORAL_CAPSULE | Freq: Every day | ORAL | 1 refills | Status: DC

## 2021-11-14 MED ORDER — CARVEDILOL 3.125 MG PO TABS: 3.1250 mg | ORAL_TABLET | Freq: Two times a day (BID) | ORAL | 3 refills | Status: AC

## 2021-11-14 MED ORDER — PANTOPRAZOLE SODIUM 20 MG PO TBEC: 20.0000 mg | DELAYED_RELEASE_TABLET | Freq: Every day | ORAL | 0 refills | Status: DC

## 2021-11-14 MED ORDER — DOCUSATE SODIUM 100 MG PO CAPS: 100.0000 mg | ORAL_CAPSULE | Freq: Two times a day (BID) | ORAL | 1 refills | Status: DC

## 2021-11-14 NOTE — Progress Notes (Signed)
? ?             Established Patient Visit ? ?Patient: [name]   DOB: [birth year]   76 y.o. Female  MRN: [id number] ?Visit Date: 11/14/2021 ? ?Subjective:  ?  ?Chief Complaint  ?Patient presents with  ? Follow-up  ?  1 month f/u on depression.  ?Pt states she is still experiencing a lot of fatigue.  ?Pt has not been taking eliquis due to cost and would like to discuss alternatives.   ? ?HPI ?Atrial fibrillation s/p AVN ablation/PPM implant, s/p LAAC with Watchman ?Rate controlled with coreg ?Current use of aspirin '81mg'$  ?Unable to afford eliquis ?Sent message to Pharmacist about possible medication assistance ? ?Chronic combined systolic and diastolic CHF (congestive heart failure) ([place]) ?Reports increased fatigue and low BP ?No syncope. No LE edema ?BP Readings from Last 3 Encounters:  ?11/14/21 100/60  ?11/10/21 133/73  ?11/08/21 128/75  ? ?Decrease cored to 3.'125mg'$  BID ?F/up in 56month? ?Constipation ?Chronic waxing and waning. 1BM per week. ?This is probably due to decreased food intake and mobility. ?Did not take colace as prescribed. ?Use of mag citrate with minimal relief. ? ?Lactulose 20g x 3days and colace '100mg'$  BID ? ? ?Wt Readings from Last 3 Encounters:  ?11/14/21 108 lb (49 kg)  ?11/08/21 117 lb (53.1 kg)  ?10/10/21 109 lb (49.4 kg)  ?  ?BP Readings from Last 3 Encounters:  ?11/14/21 100/60  ?11/10/21 133/73  ?11/08/21 128/75  ?  ? ?  11/14/2021  ?  9:55 AM 10/24/2021  ? 12:37 PM 10/10/2021  ? 10:30 AM  ?Depression screen PHQ 2/9  ?Decreased Interest '3 1 1  '$ ?Down, Depressed, Hopeless '3 1 2  '$ ?PHQ - 2 Score '6 2 3  '$ ?Altered sleeping 0 0 0  ?Tired, decreased energy '3 2 2  '$ ?Change in appetite '3 3 1  '$ ?Feeling bad or failure about yourself  '2 3 3  '$ ?Trouble concentrating '3 3 3  '$ ?Moving slowly or fidgety/restless 0 0 0  ?Suicidal thoughts 0 0 0  ?PHQ-9 Score '17 13 12  '$ ?Difficult doing work/chores Somewhat difficult Somewhat difficult Somewhat difficult  ?  ? ?  11/14/2021  ?  9:55 AM 10/10/2021  ? 10:34 AM 07/21/2021  ?  12:17 PM 03/29/2021  ?  3:23 PM  ?GAD 7 : Generalized Anxiety Score  ?Nervous, Anxious, on Edge '2 1 2 1  '$ ?Control/stop worrying 0 '1 1 1  '$ ?Worry too much - different things '1 1 1 1  '$ ?Trouble relaxing 0 '1 3 1  '$ ?Restless 0 1 2 0  ?Easily annoyed or irritable '1 1 2 1  '$ ?Afraid - awful might happen '1 1 2 1  '$ ?Total GAD 7 Score '5 7 13 6  '$ ?Anxiety Difficulty Somewhat difficult Somewhat difficult  Somewhat difficult  ? ?Reviewed medical, surgical, and social history today ? ?Medications: ?Outpatient Medications Prior to Visit  ?Medication Sig  ? acetaminophen (TYLENOL) 500 MG tablet Take 500 mg by mouth every 6 (six) hours as needed for mild pain or headache.   ? albuterol (PROVENTIL) (2.5 MG/3ML) 0.083% nebulizer solution Take 3 mLs (2.5 mg total) by nebulization every 6 (six) hours as needed for wheezing or shortness of breath. Dx:J45.909  ? albuterol (VENTOLIN HFA) 108 (90 Base) MCG/ACT inhaler INHALE 1-2 PUFFS BY MOUTH EVERY 6 HOURS AS NEEDED FOR WHEEZE OR SHORTNESS OF BREATH  ? apixaban (ELIQUIS) 2.5 MG TABS tablet Take 1 tablet (2.5 mg total) by mouth 2 (two) times  daily.  ? atorvastatin (LIPITOR) 20 MG tablet Take 1 tablet (20 mg total) by mouth daily.  ? budesonide-formoterol (SYMBICORT) 160-4.5 MCG/ACT inhaler Inhale 2 puffs into the lungs 2 (two) times daily. (Patient taking differently: Inhale 2 puffs into the lungs 2 (two) times daily. Takes differently as needed)  ? calcitRIOL (ROCALTROL) 0.25 MCG capsule Take 0.25 mcg by mouth every other day. Takes M-W-F only  ? furosemide (LASIX) 80 MG tablet Take 80 mg by mouth 3 (three) times a week.  ? mycophenolate (MYFORTIC) 180 MG EC tablet Take 180 mg by mouth 2 (two) times daily.  ? ondansetron (ZOFRAN) 4 MG tablet Take 1 tablet (4 mg total) by mouth every 8 (eight) hours as needed for up to 9 doses for nausea.  ? potassium chloride (KLOR-CON) 10 MEQ tablet Take 10 mEq by mouth daily.  ? predniSONE (DELTASONE) 5 MG tablet Take 5 mg by mouth daily.  ? tacrolimus  (PROGRAF) 1 MG capsule Take 1 mg by mouth 2 (two) times daily.  ? [DISCONTINUED] carvedilol (COREG) 6.25 MG tablet Take 6.25 mg by mouth 2 (two) times daily.  ? [DISCONTINUED] docusate sodium (COLACE) 100 MG capsule Take 100 mg by mouth at bedtime as needed for mild constipation.  ? [DISCONTINUED] DULoxetine (CYMBALTA) 30 MG capsule Take 30 mg by mouth daily.  ? [DISCONTINUED] FLUoxetine (PROZAC) 10 MG capsule TAKE 1 CAPSULE BY MOUTH EVERY DAY  ? sucralfate (CARAFATE) 1 g tablet Take 1 tablet (1 g total) by mouth 4 (four) times daily.  ? [DISCONTINUED] pantoprazole (PROTONIX) 40 MG tablet Take 1 tablet (40 mg total) by mouth 2 (two) times daily for 30 days, THEN 1 tablet (40 mg total) daily.  ? ?No facility-administered medications prior to visit.  ? ?Reviewed past medical and social history.  ? ?ROS per HPI above ? ? ?   ?Objective:  ?BP 100/60 (BP Location: Left Arm, Patient Position: Sitting, Cuff Size: Normal)   Pulse 72   Temp (!) 97 ?F (36.1 ?C) (Temporal)   Ht 5' 5.5" (1.664 m)   Wt 108 lb (49 kg)   SpO2 96%   BMI 17.70 kg/m?  ? ?  ? ?Physical Exam ?Cardiovascular:  ?   Rate and Rhythm: Normal rate and regular rhythm.  ?   Pulses: Normal pulses.  ?   Heart sounds: Normal heart sounds.  ?Pulmonary:  ?   Effort: Pulmonary effort is normal.  ?   Breath sounds: Normal breath sounds.  ?Musculoskeletal:  ?   Right lower leg: No edema.  ?   Left lower leg: No edema.  ?Neurological:  ?   Mental Status: She is alert and oriented to person, place, and time.  ?Psychiatric:     ?   Attention and Perception: Attention normal.     ?   Mood and Affect: Mood is depressed.     ?   Speech: Speech normal.     ?   Behavior: Behavior is cooperative.     ?   Thought Content: Thought content is not paranoid or delusional. Thought content does not include homicidal or suicidal ideation. Thought content does not include homicidal or suicidal plan.     ?   Cognition and Memory: Cognition normal.     ?   Judgment: Judgment  normal.  ?  ?No results found for any visits on 11/14/21. ?   ?Assessment & Plan:  ?  ?Problem List Items Addressed This Visit   ? ?  ? Cardiovascular  and Mediastinum  ? Atrial fibrillation s/p AVN ablation/PPM implant, s/p LAAC with Watchman  ?  Rate controlled with coreg ?Current use of aspirin '81mg'$  ?Unable to afford eliquis ?Sent message to Pharmacist about possible medication assistance ? ?  ?  ? Relevant Medications  ? carvedilol (COREG) 3.125 MG tablet  ? Chronic combined systolic and diastolic CHF (congestive heart failure) ([place])  ?  Reports increased fatigue and low BP ?No syncope. No LE edema ?BP Readings from Last 3 Encounters:  ?11/14/21 100/60  ?11/10/21 133/73  ?11/08/21 128/75  ? ?Decrease cored to 3.'125mg'$  BID ?F/up in 64month?  ?  ? Relevant Medications  ? carvedilol (COREG) 3.125 MG tablet  ?  ? Digestive  ? Gastroesophageal reflux disease  ? Relevant Medications  ? docusate sodium (COLACE) 100 MG capsule  ? lactulose (CHRONULAC) 10 GM/15ML solution  ? pantoprazole (PROTONIX) 20 MG tablet  ?  ? Other  ? Constipation  ?  Chronic waxing and waning. 1BM per week. ?This is probably due to decreased food intake and mobility. ?Did not take colace as prescribed. ?Use of mag citrate with minimal relief. ? ?Lactulose 20g x 3days and colace '100mg'$  BID ? ? ? ?  ?  ? Relevant Medications  ? docusate sodium (COLACE) 100 MG capsule  ? lactulose (CHRONULAC) 10 GM/15ML solution  ? Depression - Primary  ? Relevant Medications  ? FLUoxetine (PROZAC) 20 MG capsule  ? Other Relevant Orders  ? Ambulatory referral to Psychiatry  ? ?Return in about 4 weeks (around 12/12/2021) for depression and weight loss. ? ?  ? ?CWilfred Lacy NP ? ? ?

## 2021-11-14 NOTE — Assessment & Plan Note (Addendum)
Rate controlled with coreg ?Current use of aspirin '81mg'$  ?Unable to afford eliquis ?Sent message to Pharmacist about possible medication assistance ?

## 2021-11-14 NOTE — Assessment & Plan Note (Signed)
Reports increased fatigue and low BP ?No syncope. No LE edema ?BP Readings from Last 3 Encounters:  ?11/14/21 100/60  ?11/10/21 133/73  ?11/08/21 128/75  ? ?Decrease cored to 3.'125mg'$  BID ?F/up in 66month?

## 2021-11-14 NOTE — Assessment & Plan Note (Signed)
Chronic waxing and waning. 1BM per week. ?This is probably due to decreased food intake and mobility. ?Did not take colace as prescribed. ?Use of mag citrate with minimal relief. ? ?Lactulose 20g x 3days and colace '100mg'$  BID ? ? ?

## 2021-11-14 NOTE — Patient Instructions (Signed)
For Constipation: ?Take colace '100mg'$  BID ?Start lactulose 20g daily x 3days. Stop if you develop diarrhea. ? ?Decrease coreg to 3.'125mg'$  BID ? ?Increase fluoxetine to '20mg'$  daily. ?Take 2tabs of '10mg'$  till you complete current supply. ? ?You will be contacted to schedule appt with psychiatry. ?Continue appts with Bambi. ? ?Will let you know if the pharmacist has a medication assistance program for Eliquis. ?

## 2021-11-15 ENCOUNTER — Telehealth: Payer: Self-pay

## 2021-11-15 DIAGNOSIS — M1030 Gout due to renal impairment, unspecified site: Secondary | ICD-10-CM | POA: Diagnosis not present

## 2021-11-15 DIAGNOSIS — N185 Chronic kidney disease, stage 5: Secondary | ICD-10-CM | POA: Diagnosis not present

## 2021-11-15 DIAGNOSIS — I132 Hypertensive heart and chronic kidney disease with heart failure and with stage 5 chronic kidney disease, or end stage renal disease: Secondary | ICD-10-CM | POA: Diagnosis not present

## 2021-11-15 DIAGNOSIS — D631 Anemia in chronic kidney disease: Secondary | ICD-10-CM | POA: Diagnosis not present

## 2021-11-15 DIAGNOSIS — I5042 Chronic combined systolic (congestive) and diastolic (congestive) heart failure: Secondary | ICD-10-CM | POA: Diagnosis not present

## 2021-11-15 DIAGNOSIS — N2581 Secondary hyperparathyroidism of renal origin: Secondary | ICD-10-CM | POA: Diagnosis not present

## 2021-11-15 NOTE — Progress Notes (Signed)
? ? ?Chronic Care Management ?Pharmacy Assistant  ? ?Name: [name]  MRN: [id number] DOB: 09-Aug-1945 ? ?Reason for Encounter: Medication Review/Patient assistance application for Eliquis. ?  ?Recent office visits:  ?11/14/2021 Wilfred Lacy NP (PCP) start Lactulose 20 g PRN, Decrease cored to 3.'125mg'$  BID, Change pantoprazole 20 mg daily, Increase fluoxetine to '20mg'$  daily.,Ambulatory referral to Psychiatry, Return in about 4 weeks  ? ?Recent consult visits:  ?11/08/2021 Frann Rider NP (Neurology) No Medication Changes noted, Follow up 4 months ?10/31/2021 Bambi Cottle LCSW [facility]) Unable to see note ? ?Hospital visits:  ?None in previous 6 months ? ?Medications: ?Outpatient Encounter Medications as of 11/15/2021  ?Medication Sig  ? acetaminophen (TYLENOL) 500 MG tablet Take 500 mg by mouth every 6 (six) hours as needed for mild pain or headache.   ? albuterol (PROVENTIL) (2.5 MG/3ML) 0.083% nebulizer solution Take 3 mLs (2.5 mg total) by nebulization every 6 (six) hours as needed for wheezing or shortness of breath. Dx:J45.909  ? albuterol (VENTOLIN HFA) 108 (90 Base) MCG/ACT inhaler INHALE 1-2 PUFFS BY MOUTH EVERY 6 HOURS AS NEEDED FOR WHEEZE OR SHORTNESS OF BREATH  ? apixaban (ELIQUIS) 2.5 MG TABS tablet Take 1 tablet (2.5 mg total) by mouth 2 (two) times daily.  ? atorvastatin (LIPITOR) 20 MG tablet Take 1 tablet (20 mg total) by mouth daily.  ? budesonide-formoterol (SYMBICORT) 160-4.5 MCG/ACT inhaler Inhale 2 puffs into the lungs 2 (two) times daily. (Patient taking differently: Inhale 2 puffs into the lungs 2 (two) times daily. Takes differently as needed)  ? calcitRIOL (ROCALTROL) 0.25 MCG capsule Take 0.25 mcg by mouth every other day. Takes M-W-F only  ? carvedilol (COREG) 3.125 MG tablet Take 1 tablet (3.125 mg total) by mouth 2 (two) times daily with a meal.  ? docusate sodium (COLACE) 100 MG capsule Take 1 capsule (100 mg total) by mouth 2 (two) times daily.  ? FLUoxetine (PROZAC) 20 MG  capsule Take 1 capsule (20 mg total) by mouth daily.  ? furosemide (LASIX) 80 MG tablet Take 80 mg by mouth 3 (three) times a week.  ? lactulose (CHRONULAC) 10 GM/15ML solution Take 30 mLs (20 g total) by mouth daily as needed for mild constipation or severe constipation.  ? mycophenolate (MYFORTIC) 180 MG EC tablet Take 180 mg by mouth 2 (two) times daily.  ? ondansetron (ZOFRAN) 4 MG tablet Take 1 tablet (4 mg total) by mouth every 8 (eight) hours as needed for up to 9 doses for nausea.  ? pantoprazole (PROTONIX) 20 MG tablet Take 1 tablet (20 mg total) by mouth daily.  ? potassium chloride (KLOR-CON) 10 MEQ tablet Take 10 mEq by mouth daily.  ? predniSONE (DELTASONE) 5 MG tablet Take 5 mg by mouth daily.  ? sucralfate (CARAFATE) 1 g tablet Take 1 tablet (1 g total) by mouth 4 (four) times daily.  ? tacrolimus (PROGRAF) 1 MG capsule Take 1 mg by mouth 2 (two) times daily.  ? ?No facility-administered encounter medications on file as of 11/15/2021.  ? ? ?Care Gaps: ?Shingrix Vaccine ?COVID-19 Vaccine (4- Booster) ?  ?Star Rating Drugs: ?Atorvastatin 20 mg last filled 08/30/2021 30 day supply at [facility]. ?  ?Medication Fill Gaps: ?None ? ?I received a task from Junius Argyle, CPP requesting that I start the application for patient assistance on the medication Eliquis.  ?  ?Spoke with the patient and she  requested that the application be place at the front desk for her to pick up.I informed  her once she receives the application she will need to complete her part of the application and return it to her  PCP office for Junius Argyle, CPP to fax over to the [facility] for processing.Informed patient to include a copy of her proof of income AND a copy of her Explanation of Benefits (EOB) statement from her insurance. The application will need to be faxed to [phone] and the phone number that can be called to check the status of the application will be [phone]. Patient verbalized understanding and was  provided my phone number of [phone] if she has any questions. ?  ?Application emailed to Junius Argyle, CPP for review and to be place at the front desk for patient spouse to pick up. ? ? ?[name],CPA ?Clinical Pharmacist Assistant ?[phone]  ? ?

## 2021-11-15 NOTE — Progress Notes (Deleted)
V[facility] OF [place]  ASSESSMENT / PLAN: 76 y.o. female with ESRD dialyzing *** via ***. She has aneurysmal degeneration of ***.   CHIEF COMPLAINT: ***  HISTORY OF PRESENT ILLNESS: [name] is a 76 y.o. female ***  VASCULAR SURGICAL HISTORY: ***  VASCULAR RISK FACTORS: {FINDINGS; POSITIVE [phone]} history of stroke / transient ischemic attack. {FINDINGS; POSITIVE [phone]} history of coronary artery disease. *** history of PCI. *** history of CABG.  {FINDINGS; POSITIVE [phone]} history of diabetes mellitus. Last A1c ***. {FINDINGS; POSITIVE [phone]} history of smoking. *** actively smoking. {FINDINGS; POSITIVE [phone]} history of hypertension. *** drug regimen with *** control. {FINDINGS; POSITIVE [phone]} history of chronic kidney disease.  Last GFR ***. CKD {stage:30421363}. {FINDINGS; POSITIVE [phone]} history of chronic obstructive pulmonary disease, treated with ***.  FUNCTIONAL STATUS: ECOG performance status: {findings; ecog performance status:31780} Ambulatory status: {TNHAmbulation:25868}  Past Medical History:  Diagnosis Date   Acute diastolic (congestive) heart failure (HCC) 08/12/2017   Acute GI bleeding 11/14/2019   Acute kidney injury superimposed on chronic kidney disease ([place]) 11/17/2017   AKI (acute kidney injury) ([place]) 11/04/2017   Anemia    Anxiety    ARF (acute renal failure) ([place]) 06/06/2011   Arthritis    "shoulders; arms; hips" (02/04/2018)   Asthma    Atrial fibrillation (HCC)    Atrial flutter ([place])    s/p aflutter ablation at [facility]   Bacteriuria, asymptomatic 11/14/2020   Benign hypertension with ESRD (end-stage renal disease) ([place])    Blood transfusion    never had a reaction to blood transfusion   Cardiomyopathy Mar 2013   Mild, EF 50-55% by Mar 2013 ECHO, diast dysfxn II   Cardiomyopathy ([place])    CHF (congestive heart failure) ([place]) 2005    CHF (congestive heart failure) ([place])    Childhood asthma    Closed fracture of right distal radius 10/18/2016   CMV (cytomegalovirus infection) ([place]) 10/03/2015   Constipation    takes Miralax daily   Constipation    Depression    takes Zoloft daily   Distal radius fracture, right    DVT of lower extremity, bilateral ([place]) 12/21/11   "they're there now; been there for 2 wks"   Eczema    End stage renal disease ([place]) 11/06/2011   ESRD (end stage renal disease) on dialysis [facility]) 09/2011   07-19-2015 had Kidney transplant at [facility]; "don't get dialysis anymore" (02/04/2018)   Essential hypertension 07/07/2007   Qualifier: Diagnosis of  By: Garen Grams     Fractures, stress    in both feet--6 OR 7 YRS AGO--RESOLVED   Generalized edema [id number]   GI bleed 11/14/2019   Gout    doesn't require meds    HCAP (healthcare-associated pneumonia) 09/10/2011   Hearing difficulty    Hematoma of left lower leg 05/17/2020   High cholesterol    History of CVA (cerebrovascular accident) without residual deficits    History of hip replacement, total, right    History of pneumonia    Hx of Clostridium difficile infection    Hypercalcemia    09/10/11   Hyperparathyroidism ([place]) 04/07/2013   Hypertension    takes Diltiazem daily    Hypomagnesemia 08/02/2015   Hypophosphatemia 11/08/2017   ICB (intracranial bleed) ([place])    Immunosuppression ([place]) 07/25/2015   Memory changes    Morbid obesity ([place])    Nonischemic dilated cardiomyopathy ([place])    Obesity 04/07/2013   Oligouria    Oral mucositis 02/22/2018   OSA on  CPAP    PAF (paroxysmal atrial fibrillation) (HCC)     HX OF CEREBRAL BLEED WHILE ON COUMADIN-SO PT NOT ON ANY BLOOD THINNERS NOW   Peripheral vascular disease ([place])    Persistent atrial fibrillation ([place]) 08/12/2017   Overview:  Added automatically from request for surgery [id number]  Formatting of this note might be different from the original. Added automatically from request for surgery  [id number]   Presence of Watchman left atrial appendage closure device 10/04/2017   Proteinuria 09/04/2016   Psoriasis 08/07/2016   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/25/2015   S/P insertion of IVC (inferior vena caval) filter    Sepsis ([place]) 11/08/2017   Stress fracture    bilateral feet   Stroke ([place]) 2009   denies residual on 02/04/2018;  hemorrhagic now off coumadin   Stroke ([place]) 07/17/2007   denies residual, hemorrhagic now off coumadin   Stroke due to intracerebral hemorrhage ([place]) 2009   Suture reaction 09/04/2016   Transfusion history    Use of cane as ambulatory aid     Past Surgical History:  Procedure Laterality Date   AV FISTULA PLACEMENT  09/28/2011   Procedure: ARTERIOVENOUS (AV) FISTULA CREATION;  Surgeon: Rosetta Posner, MD;  Location: [place];  Service: Vascular;  Laterality: Left;   BIOPSY  07/10/2021   Procedure: BIOPSY;  Surgeon: Irving Copas., MD;  Location[facility];  Service: Gastroenterology;;   CARDIAC CATHETERIZATION     CARDIAC VALVE [place] DEFECT   CHOLECYSTECTOMY     1980's   COLONOSCOPY     COLONOSCOPY WITH PROPOFOL N/A 11/16/2019   Procedure: COLONOSCOPY WITH PROPOFOL;  Surgeon: Juanita Craver, MD;  Location: [facility] ENDOSCOPY;  Service: Endoscopy;  Laterality: N/A;   CYSTOSCOPY     many yrs ago   ESOPHAGOGASTRODUODENOSCOPY N/A 07/10/2021   Procedure: ESOPHAGOGASTRODUODENOSCOPY (EGD);  Surgeon: Irving Copas., MD;  Location: [facility];  Service: Gastroenterology;  Laterality: N/A;   ESOPHAGOGASTRODUODENOSCOPY (EGD) WITH PROPOFOL N/A 11/16/2019   Procedure: ESOPHAGOGASTRODUODENOSCOPY (EGD) WITH PROPOFOL;  Surgeon: Juanita Craver, MD;  Location[facility]COPY;  Service: Endoscopy;  Laterality: N/A;   ESOPHAGOGASTRODUODENOSCOPY (EGD) WITH PROPOFOL N/A 03/20/2021   Procedure: ESOPHAGOGASTRODUODENOSCOPY (EGD) WITH PROPOFOL;  Surgeon: Milus Banister, MD;  Location: [facility] ENDOSCOPY;  Service: Endoscopy;  Laterality: N/A;   FEMUR  FRACTURE SURGERY Right 03/2011; 09/03/2011   "had 2, 2 wk apart in 2012; broke it again 08/2011 & had OR"   [place] N/A 07/10/2021   Procedure: FLEXIBLE SIGMOIDOSCOPY;  Surgeon: Rush Landmark Telford Nab., MD;  Location[facility];  Service: Gastroenterology;  Laterality: N/A;   FRACTURE SURGERY     GIVENS CAPSULE STUDY N/A 07/11/2021   Procedure: GIVENS CAPSULE STUDY;  Surgeon: Juanita Craver, MD;  [facility];  Service: Endoscopy;  Laterality: N/A;   HEMOSTASIS CONTROL  03/20/2021   Procedure: HEMOSTASIS CONTROL;  Surgeon: Milus Banister, MD;  Location: [place];  Service: Endoscopy;;   HOT HEMOSTASIS N/A 03/20/2021   Procedure: HOT HEMOSTASIS (ARGON PLASMA COAGULATION/BICAP);  Surgeon: Milus Banister, MD;  Location: [facility] ENDOSCOPY;  Service: Endoscopy;  Laterality: N/A;   HOT HEMOSTASIS N/A 07/10/2021   Procedure: HOT HEMOSTASIS (ARGON PLASMA COAGULATION/BICAP);  Surgeon: Irving Copas., MD;  Location[facility];  Service: Gastroenterology;  Laterality: N/A;   I & D EXTREMITY  09/15/2011   Procedure: IRRIGATION AND DEBRIDEMENT EXTREMITY w REMOVAL OF HARDWARE;  Surgeon: [name], MD;  Location: [place];  Service: Orthopedics;  Laterality: Right;   INCISION AND DRAINAGE HIP  12/21/2011   Procedure: IRRIGATION AND DEBRIDEMENT HIP;  Surgeon: [name], MD;  Location: [place];  Service: Orthopedics;  Laterality: Right;  I&D RIGHT HIP WITH PLACEMENT ANTIBIOTIC SPACER   INSERTION OF DIALYSIS CATHETER     Procedure: INSERTION OF DIALYSIS CATHETER;  Surgeon: Rosetta Posner, MD;  Location: [place];  Service: Vascular;  Laterality: Right;   IRRIGATION AND DEBRIDEMENT ABSCESS  12/21/11   right hip   JOINT REPLACEMENT     KIDNEY TRANSPLANT  07/19/15   LAPAROSCOPIC GASTRIC BANDING  2008   LEFT ATRIAL APPENDAGE OCCLUSION  10/04/2017   OPEN REDUCTION INTERNAL FIXATION (ORIF) DISTAL RADIAL FRACTURE Right 10/18/2016   Procedure: OPEN REDUCTION INTERNAL FIXATION (ORIF) RIGHT  DISTAL RADIAL FRACTURE;  Surgeon: Marchia Bond, MD;  Location: [place];  Service: Orthopedics;  Laterality: Right;   SUBMUCOSAL TATTOO INJECTION  07/10/2021   Procedure: SUBMUCOSAL TATTOO INJECTION;  Surgeon: Irving Copas., MD;  Location[facility];  Service: Gastroenterology;;   TOTAL HIP ARTHROPLASTY Right 02/2011   right THA 02/2011, I&D/removal of hardware 09/2011,, repeat I&D Jun 2013, reimplantation R THA 03-26-2012   TOTAL HIP REVISION  03/25/2012   Procedure: TOTAL HIP REVISION;  Surgeon: [name], MD;  [facility];  Service: Orthopedics;  Laterality: Right;  Right Total Hip Reimplantation   TUBAL LIGATION     VENA CAVA FILTER PLACEMENT  11/2011    Family History  Problem Relation Age of Onset   Cancer Father    Diabetes Mother    Hypertension Mother    Arthritis Mother    Hodgkin's lymphoma Other 65       dscd---HODGKINS DISEASE   Hypertension Brother     Social History   Socioeconomic History   Marital status: Married    Spouse name: Not on file   Number of children: Not on file   Years of education: Not on file   Highest education level: Not on file  Occupational History   Occupation: retired  Tobacco Use   Smoking status: Never   Smokeless tobacco: Never  Vaping Use   Vaping Use: Never used  Substance and Sexual Activity   Alcohol use: Never   Drug use: Never   Sexual activity: Yes    Birth control/protection: Post-menopausal  Other Topics Concern   Not on file  Social History Narrative   Married; retired; lives in [place] Strain: Not on file  Food Insecurity: No Food Insecurity   Worried About Charity fundraiser in the Last Year: Never true   Arboriculturist in the Last Year: Never true  Transportation Needs: No Transportation Needs   Lack of Transportation (Medical): No   Lack of Transportation (Non-Medical): No  Physical Activity: Not on file  Stress:  Not on file  Social Connections: Not on file  Intimate Partner Violence: Not on file    Allergies  Allergen Reactions   Warfarin Sodium Other (See Comments)    Caused her to have a stroke/left side of brain to hemorrhage    Ace Inhibitors Cough   Amiodarone Other (See Comments)    Pt with Restrictive Lung Disease by 10/2017 PFT with decreased DLCO   Amlodipine Swelling    Swelling in feet    Current Outpatient Medications  Medication Sig Dispense Refill   acetaminophen (TYLENOL) 500 MG tablet Take 500 mg by mouth  every 6 (six) hours as needed for mild pain or headache.      albuterol (PROVENTIL) (2.5 MG/3ML) 0.083% nebulizer solution Take 3 mLs (2.5 mg total) by nebulization every 6 (six) hours as needed for wheezing or shortness of breath. Dx:J45.909 150 mL 2   albuterol (VENTOLIN HFA) 108 (90 Base) MCG/ACT inhaler INHALE 1-2 PUFFS BY MOUTH EVERY 6 HOURS AS NEEDED FOR WHEEZE OR SHORTNESS OF BREATH 8.5 each 1   apixaban (ELIQUIS) 2.5 MG TABS tablet Take 1 tablet (2.5 mg total) by mouth 2 (two) times daily. 180 tablet 1   atorvastatin (LIPITOR) 20 MG tablet Take 1 tablet (20 mg total) by mouth daily.     budesonide-formoterol (SYMBICORT) 160-4.5 MCG/ACT inhaler Inhale 2 puffs into the lungs 2 (two) times daily. (Patient taking differently: Inhale 2 puffs into the lungs 2 (two) times daily. Takes differently as needed) 1 each 5   calcitRIOL (ROCALTROL) 0.25 MCG capsule Take 0.25 mcg by mouth every other day. Takes M-W-F only     carvedilol (COREG) 3.125 MG tablet Take 1 tablet (3.125 mg total) by mouth 2 (two) times daily with a meal. 180 tablet 3   docusate sodium (COLACE) 100 MG capsule Take 1 capsule (100 mg total) by mouth 2 (two) times daily. 180 capsule 1   FLUoxetine (PROZAC) 20 MG capsule Take 1 capsule (20 mg total) by mouth daily. 90 capsule 1   furosemide (LASIX) 80 MG tablet Take 80 mg by mouth 3 (three) times a week.     lactulose (CHRONULAC) 10 GM/15ML solution Take 30 mLs (20  g total) by mouth daily as needed for mild constipation or severe constipation. 236 mL 0   mycophenolate (MYFORTIC) 180 MG EC tablet Take 180 mg by mouth 2 (two) times daily.     ondansetron (ZOFRAN) 4 MG tablet Take 1 tablet (4 mg total) by mouth every 8 (eight) hours as needed for up to 9 doses for nausea. 9 tablet 0   pantoprazole (PROTONIX) 20 MG tablet Take 1 tablet (20 mg total) by mouth daily. 90 tablet 0   potassium chloride (KLOR-CON) 10 MEQ tablet Take 10 mEq by mouth daily.     predniSONE (DELTASONE) 5 MG tablet Take 5 mg by mouth daily.     sucralfate (CARAFATE) 1 g tablet Take 1 tablet (1 g total) by mouth 4 (four) times daily. 120 tablet 0   tacrolimus (PROGRAF) 1 MG capsule Take 1 mg by mouth 2 (two) times daily.     No current facility-administered medications for this visit.    PHYSICAL EXAM There were no vitals filed for this visit.  Constitutional: *** appearing. *** distress. Appears *** nourished.  Neurologic: CN ***. *** focal findings. *** sensory loss. Psychiatric: *** Mood and affect symmetric and appropriate. Eyes: *** No icterus. No conjunctival pallor. Ears, nose, throat: *** mucous membranes moist. Midline trachea.  Cardiac: *** rate and rhythm.  Respiratory: *** unlabored. Abdominal: *** soft, non-tender, non-distended.  Peripheral vascular: *** Extremity: *** edema. *** cyanosis. *** pallor.  Skin: *** gangrene. *** ulceration.  Lymphatic: *** Stemmer's sign. *** palpable lymphadenopathy.  PERTINENT LABORATORY AND RADIOLOGIC DATA  Most recent CBC    Latest Ref Rng & Units 11/10/2021    2:09 PM 10/27/2021   12:34 PM 08/15/2021   11:09 AM  CBC  Hemoglobin 12.0 - 15.0 g/dL 10.2   9.5   10.7       Most recent CMP    Latest Ref Rng & Units 07/26/2021  3:37 AM 07/25/2021    6:20 AM 07/24/2021    5:15 AM  CMP  Glucose 70 - 99 mg/dL 86   98   92    BUN 8 - 23 mg/dL '30   31   31    '$ Creatinine 0.44 - 1.00 mg/dL 3.24   3.12   3.18    Sodium 135 - 145  mmol/L 139   140   139    Potassium 3.5 - 5.1 mmol/L 4.0   4.1   2.8    Chloride 98 - 111 mmol/L 113   112   110    CO2 22 - 32 mmol/L '18   22   20    '$ Calcium 8.9 - 10.3 mg/dL 8.4   8.3   8.4      Renal function CrCl cannot be calculated (Patient's most recent lab result is older than the maximum 21 days allowed.).  Hgb A1c MFr Bld (%)  Date Value  07/23/2021 4.7 (L)    LDL Cholesterol  Date Value Ref Range Status  07/24/2021 71 0 - 99 mg/dL Final    Comment:           Total Cholesterol/HDL:CHD Risk Coronary Heart Disease Risk Table                     Men   Women  1/2 Average Risk   3.4   3.3  Average Risk       5.0   4.4  2 X Average Risk   9.6   7.1  3 X Average Risk  23.4   11.0        Use the calculated Patient Ratio above and the CHD Risk Table to determine the patient's CHD Risk.        ATP III CLASSIFICATION (LDL):  <100     mg/dL   Optimal  100-129  mg/dL   Near or Above                    Optimal  130-159  mg/dL   Borderline  160-189  mg/dL   High  >190     mg/dL   Very High Performed at [facility] [address], [place], [place] [address]      Vascular Imaging: ***  Yevonne Aline. Stanford Breed, MD [facility] Phone Number: [phone] 11/15/2021 9:58 AM  Total time spent on preparing this encounter including chart review, data review, collecting history, examining the patient, coordinating care for this {tnhtimebilling:26202}  Portions of this report may have been transcribed using voice recognition software.  Every effort has been made to ensure accuracy; however, inadvertent computerized transcription errors may still be present.

## 2021-11-16 ENCOUNTER — Encounter: Payer: Medicare Other | Admitting: Vascular Surgery

## 2021-11-16 DIAGNOSIS — I5042 Chronic combined systolic (congestive) and diastolic (congestive) heart failure: Secondary | ICD-10-CM | POA: Diagnosis not present

## 2021-11-16 DIAGNOSIS — N2581 Secondary hyperparathyroidism of renal origin: Secondary | ICD-10-CM | POA: Diagnosis not present

## 2021-11-16 DIAGNOSIS — I132 Hypertensive heart and chronic kidney disease with heart failure and with stage 5 chronic kidney disease, or end stage renal disease: Secondary | ICD-10-CM | POA: Diagnosis not present

## 2021-11-16 DIAGNOSIS — D631 Anemia in chronic kidney disease: Secondary | ICD-10-CM | POA: Diagnosis not present

## 2021-11-16 DIAGNOSIS — M1030 Gout due to renal impairment, unspecified site: Secondary | ICD-10-CM | POA: Diagnosis not present

## 2021-11-16 DIAGNOSIS — N185 Chronic kidney disease, stage 5: Secondary | ICD-10-CM | POA: Diagnosis not present

## 2021-11-17 ENCOUNTER — Ambulatory Visit: Payer: Medicare Other | Admitting: Psychology

## 2021-11-17 NOTE — Progress Notes (Signed)
Patient did not log on to appointment today.  Patient scheduled again on 5/16/203 ? ? ? ? ? ? ? ? ? ? ? ? ? ? ?Dainelle Hun G Tiffnay Bossi, LCSW ?

## 2021-11-20 DIAGNOSIS — Z20822 Contact with and (suspected) exposure to covid-19: Secondary | ICD-10-CM | POA: Diagnosis not present

## 2021-11-20 DIAGNOSIS — M1030 Gout due to renal impairment, unspecified site: Secondary | ICD-10-CM | POA: Diagnosis not present

## 2021-11-20 DIAGNOSIS — N2581 Secondary hyperparathyroidism of renal origin: Secondary | ICD-10-CM | POA: Diagnosis not present

## 2021-11-20 DIAGNOSIS — I5042 Chronic combined systolic (congestive) and diastolic (congestive) heart failure: Secondary | ICD-10-CM | POA: Diagnosis not present

## 2021-11-20 DIAGNOSIS — N185 Chronic kidney disease, stage 5: Secondary | ICD-10-CM | POA: Diagnosis not present

## 2021-11-20 DIAGNOSIS — I132 Hypertensive heart and chronic kidney disease with heart failure and with stage 5 chronic kidney disease, or end stage renal disease: Secondary | ICD-10-CM | POA: Diagnosis not present

## 2021-11-20 DIAGNOSIS — D631 Anemia in chronic kidney disease: Secondary | ICD-10-CM | POA: Diagnosis not present

## 2021-11-20 NOTE — Progress Notes (Signed)
VASCULAR AND VEIN SPECIALISTS OF [place] ? ?ASSESSMENT / PLAN: ?76 y.o. female with ESRD status post deceased donor kidney transplant. She previously dialyzed through a left upper extremity brachiocephalic arteriovenous fistula.  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  The patient is not interested in any kind of revision or excision of this fistula unless absolutely necessary.  I counseled her that continued observation of this fistula is totally appropriate given the intact nature of her skin.  Okay to use the fistula should she need it going forward.  She can follow-up with me as needed. ? ?CHIEF COMPLAINT: Aneurysmal arteriovenous fistula. ? ?HISTORY OF PRESENT ILLNESS: ?[name] is a 76 y.o. female referred to clinic for evaluation of aneurysmal arteriovenous fistula.  She has end-stage renal disease.  She underwent deceased donor kidney transplant.  Unfortunately her transplant action is deteriorating.  Dr. Royce Macadamia evaluated her left arm fistula and was concerned because of its enlarging size.  The patient is not concerned about her fistula very much.  The fistula does not bother her.  There is been no noted ulcerations or areas of hypopigmentation over the fistula. ? ?Past Medical History:  ?Diagnosis Date  ? Acute diastolic (congestive) heart failure ([place]) 08/12/2017  ? Acute GI bleeding 11/14/2019  ? Acute kidney injury superimposed on chronic kidney disease ([place]) 11/17/2017  ? AKI (acute kidney injury) ([place]) 11/04/2017  ? Anemia   ? Anxiety   ? ARF (acute renal failure) ([place]) 06/06/2011  ? Arthritis   ? "shoulders; arms; hips" (02/04/2018)  ? Asthma   ? Atrial fibrillation ([place])   ? Atrial flutter [facility])   ? s/p aflutter ablation at [facility]  ? Bacteriuria, asymptomatic 11/14/2020  ? Benign hypertension with ESRD (end-stage renal disease) ([place])   ? Blood transfusion   ? never had a reaction to blood transfusion  ? Cardiomyopathy Mar 2013  ? Mild, EF 50-55% by Mar 2013 ECHO, diast dysfxn II  ?  Cardiomyopathy ([place])   ? CHF (congestive heart failure) ([place]) 2005  ? CHF (congestive heart failure) ([place])   ? Childhood asthma   ? Closed fracture of right distal radius 10/18/2016  ? CMV (cytomegalovirus infection) ([place]) 10/03/2015  ? Constipation   ? takes Miralax daily  ? Constipation   ? Depression   ? takes Zoloft daily  ? Distal radius fracture, right   ? DVT of lower extremity, bilateral ([place]) 12/21/11  ? "they're there now; been there for 2 wks"  ? Eczema   ? End stage renal disease ([place]) 11/06/2011  ? ESRD (end stage renal disease) on dialysis [facility]) 09/2011  ? 07-19-2015 had Kidney transplant at [facility]; "don't get dialysis anymore" (02/04/2018)  ? Essential hypertension 07/07/2007  ? Qualifier: Diagnosis of  By: Garen Grams    ? Fractures, stress   ? in both feet--6 OR 7 YRS AGO--RESOLVED  ? Generalized edema [id number]  ? GI bleed 11/14/2019  ? Gout   ? doesn't require meds   ? HCAP (healthcare-associated pneumonia) 09/10/2011  ? Hearing difficulty   ? Hematoma of left lower leg 05/17/2020  ? High cholesterol   ? History of CVA (cerebrovascular accident) without residual deficits   ? History of hip replacement, total, right   ? History of pneumonia   ? Hx of Clostridium difficile infection   ? Hypercalcemia   ? 09/10/11  ? Hyperparathyroidism ([place]) 04/07/2013  ? Hypertension   ? takes Diltiazem daily   ? Hypomagnesemia 08/02/2015  ? Hypophosphatemia 11/08/2017  ? ICB (  intracranial bleed) ([place])   ? Immunosuppression ([place]) 07/25/2015  ? Memory changes   ? Morbid obesity ([place])   ? Nonischemic dilated cardiomyopathy ([place])   ? Obesity 04/07/2013  ? Oligouria   ? Oral mucositis 02/22/2018  ? OSA on CPAP   ? PAF (paroxysmal atrial fibrillation) ([place])   ?  HX OF CEREBRAL BLEED WHILE ON COUMADIN-SO PT NOT ON ANY BLOOD THINNERS NOW  ? Peripheral vascular disease ([place])   ? Persistent atrial fibrillation ([place]) 08/12/2017  ? Overview:  Added automatically from request for surgery [id number]  Formatting of this note might be  different from the original. Added automatically from request for surgery [id number]  ? Presence of Watchman left atrial appendage closure device 10/04/2017  ? Proteinuria 09/04/2016  ? Psoriasis 08/07/2016  ? 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/25/2015  ? S/P insertion of IVC (inferior vena caval) filter   ? Sepsis ([place]) 11/08/2017  ? Stress fracture   ? bilateral feet  ? Stroke [facility]) 2009  ? denies residual on 02/04/2018;  hemorrhagic now off coumadin  ? Stroke ([place]) 07/17/2007  ? denies residual, hemorrhagic now off coumadin  ? Stroke due to intracerebral hemorrhage [facility]) 2009  ? Suture reaction 09/04/2016  ? Transfusion history   ? Use of cane as ambulatory aid   ? ? ?Past Surgical History:  ?Procedure Laterality Date  ? AV FISTULA PLACEMENT  09/28/2011  ? Procedure: ARTERIOVENOUS (AV) FISTULA CREATION;  Surgeon: Rosetta Posner, MD;  Location: [place];  Service: Vascular;  Laterality: Left;  ? BIOPSY  07/10/2021  ? Procedure: BIOPSY;  Surgeon: Irving Copas., MD;  [facility];  Service: Gastroenterology;;  ? CARDIAC CATHETERIZATION    ? [place]  ?  FOR ATRIAL SEPTAL DEFECT  ? CHOLECYSTECTOMY    ? 1980's  ? COLONOSCOPY    ? COLONOSCOPY WITH PROPOFOL N/A 11/16/2019  ? Procedure: COLONOSCOPY WITH PROPOFOL;  Surgeon: Juanita Craver, MD;  Location: [facility] ENDOSCOPY;  Service: Endoscopy;  Laterality: N/A;  ? CYSTOSCOPY    ? many yrs ago  ? ESOPHAGOGASTRODUODENOSCOPY N/A 07/10/2021  ? Procedure: ESOPHAGOGASTRODUODENOSCOPY (EGD);  Surgeon: Irving Copas., MD;  [facility];  Service: Gastroenterology;  Laterality: N/A;  ? ESOPHAGOGASTRODUODENOSCOPY (EGD) WITH PROPOFOL N/A 11/16/2019  ? Procedure: ESOPHAGOGASTRODUODENOSCOPY (EGD) WITH PROPOFOL;  Surgeon: Juanita Craver, MD;  Location: [facility] ENDOSCOPY;  Service: Endoscopy;  Laterality: N/A;  ? ESOPHAGOGASTRODUODENOSCOPY (EGD) WITH PROPOFOL N/A 03/20/2021  ? Procedure: ESOPHAGOGASTRODUODENOSCOPY (EGD) WITH PROPOFOL;  Surgeon: Milus Banister, MD;   Location: [facility] ENDOSCOPY;  Service: Endoscopy;  Laterality: N/A;  ? FEMUR FRACTURE SURGERY Right 03/2011; 09/03/2011  ? "had 2, 2 wk apart in 2012; broke it again 08/2011 & had OR"  ? FLEXIBLE SIGMOIDOSCOPY N/A 07/10/2021  ? Procedure: FLEXIBLE SIGMOIDOSCOPY;  Surgeon: Irving Copas., MD;  [facility];  Service: Gastroenterology;  Laterality: N/A;  ? FRACTURE SURGERY    ? GIVENS CAPSULE STUDY N/A 07/11/2021  ? Procedure: GIVENS CAPSULE STUDY;  Surgeon: Juanita Craver, MD;  [facility];  Service: Endoscopy;  Laterality: N/A;  ? HEMOSTASIS CONTROL  03/20/2021  ? Procedure: HEMOSTASIS CONTROL;  Surgeon: Milus Banister, MD;  Location: [place];  Service: Endoscopy;;  ? HOT HEMOSTASIS N/A 03/20/2021  ? Procedure: HOT HEMOSTASIS (ARGON PLASMA COAGULATION/BICAP);  Surgeon: Milus Banister, MD;  Location: [facility] ENDOSCOPY;  Service: Endoscopy;  Laterality: N/A;  ? HOT HEMOSTASIS N/A 07/10/2021  ? Procedure: HOT HEMOSTASIS (ARGON PLASMA COAGULATION/BICAP);  Surgeon: Justice Britain  Brooke Bonito., MD;  [facility];  Service: Gastroenterology;  Laterality: N/A;  ? I & D EXTREMITY  09/15/2011  ? Procedure: IRRIGATION AND DEBRIDEMENT EXTREMITY w REMOVAL OF HARDWARE;  Surgeon: [name], MD;  Location: [place];  Service: Orthopedics;  Laterality: Right;  ? INCISION AND DRAINAGE HIP  12/21/2011  ? Procedure: IRRIGATION AND DEBRIDEMENT HIP;  Surgeon: [name], MD;  Location: [place];  Service: Orthopedics;  Laterality: Right;  I&D RIGHT HIP WITH PLACEMENT ANTIBIOTIC SPACER  ? INSERTION OF DIALYSIS CATHETER    ? Procedure: INSERTION OF DIALYSIS CATHETER;  Surgeon: Rosetta Posner, MD;  Location: [place];  Service: Vascular;  Laterality: Right;  ? IRRIGATION AND DEBRIDEMENT ABSCESS  12/21/11  ? right hip  ? JOINT REPLACEMENT    ? KIDNEY TRANSPLANT  07/19/15  ? LAPAROSCOPIC GASTRIC BANDING  2008  ? LEFT ATRIAL APPENDAGE OCCLUSION  10/04/2017  ? OPEN REDUCTION INTERNAL FIXATION (ORIF) DISTAL RADIAL FRACTURE Right  10/18/2016  ? Procedure: OPEN REDUCTION INTERNAL FIXATION (ORIF) RIGHT DISTAL RADIAL FRACTURE;  Surgeon: Marchia Bond, MD;  Location: [place];  Service: Orthopedics;  Laterality: Right;  ? SUBMUCOS

## 2021-11-21 ENCOUNTER — Encounter: Payer: Self-pay | Admitting: Vascular Surgery

## 2021-11-21 ENCOUNTER — Ambulatory Visit (INDEPENDENT_AMBULATORY_CARE_PROVIDER_SITE_OTHER): Payer: Medicare Other | Admitting: Vascular Surgery

## 2021-11-21 VITALS — BP 151/82 | HR 73 | Temp 98.6°F | Resp 20 | Ht 65.5 in | Wt 108.0 lb

## 2021-11-21 DIAGNOSIS — Z992 Dependence on renal dialysis: Secondary | ICD-10-CM | POA: Diagnosis not present

## 2021-11-21 DIAGNOSIS — N186 End stage renal disease: Secondary | ICD-10-CM

## 2021-11-22 ENCOUNTER — Telehealth: Payer: Self-pay

## 2021-11-22 ENCOUNTER — Ambulatory Visit: Payer: Medicare Other | Admitting: Cardiology

## 2021-11-22 DIAGNOSIS — M1030 Gout due to renal impairment, unspecified site: Secondary | ICD-10-CM | POA: Diagnosis not present

## 2021-11-22 DIAGNOSIS — D631 Anemia in chronic kidney disease: Secondary | ICD-10-CM | POA: Diagnosis not present

## 2021-11-22 DIAGNOSIS — I5042 Chronic combined systolic (congestive) and diastolic (congestive) heart failure: Secondary | ICD-10-CM | POA: Diagnosis not present

## 2021-11-22 DIAGNOSIS — I132 Hypertensive heart and chronic kidney disease with heart failure and with stage 5 chronic kidney disease, or end stage renal disease: Secondary | ICD-10-CM | POA: Diagnosis not present

## 2021-11-22 DIAGNOSIS — N2581 Secondary hyperparathyroidism of renal origin: Secondary | ICD-10-CM | POA: Diagnosis not present

## 2021-11-22 DIAGNOSIS — N185 Chronic kidney disease, stage 5: Secondary | ICD-10-CM | POA: Diagnosis not present

## 2021-11-22 NOTE — Progress Notes (Signed)
Chronic Care Management  APPOINTMENT REMINDER  [name] was reminded to have all medications, supplements and any blood glucose and blood pressure readings available for review with Junius Argyle, Pharm. D, at her telephone visit on 11/23/2021 at 8:30 am.  Patient confirm appointment.  [place] Pharmacist Assistant [phone]

## 2021-11-23 ENCOUNTER — Ambulatory Visit (INDEPENDENT_AMBULATORY_CARE_PROVIDER_SITE_OTHER): Payer: Medicare Other

## 2021-11-23 DIAGNOSIS — M1030 Gout due to renal impairment, unspecified site: Secondary | ICD-10-CM | POA: Diagnosis not present

## 2021-11-23 DIAGNOSIS — I5022 Chronic systolic (congestive) heart failure: Secondary | ICD-10-CM

## 2021-11-23 DIAGNOSIS — D631 Anemia in chronic kidney disease: Secondary | ICD-10-CM | POA: Diagnosis not present

## 2021-11-23 DIAGNOSIS — I132 Hypertensive heart and chronic kidney disease with heart failure and with stage 5 chronic kidney disease, or end stage renal disease: Secondary | ICD-10-CM | POA: Diagnosis not present

## 2021-11-23 DIAGNOSIS — I5042 Chronic combined systolic (congestive) and diastolic (congestive) heart failure: Secondary | ICD-10-CM | POA: Diagnosis not present

## 2021-11-23 DIAGNOSIS — N185 Chronic kidney disease, stage 5: Secondary | ICD-10-CM | POA: Diagnosis not present

## 2021-11-23 DIAGNOSIS — I6930 Unspecified sequelae of cerebral infarction: Secondary | ICD-10-CM

## 2021-11-23 DIAGNOSIS — N2581 Secondary hyperparathyroidism of renal origin: Secondary | ICD-10-CM | POA: Diagnosis not present

## 2021-11-23 MED ORDER — ATORVASTATIN CALCIUM 20 MG PO TABS: 20.0000 mg | ORAL_TABLET | Freq: Every day | ORAL | 1 refills | Status: DC

## 2021-11-23 NOTE — Progress Notes (Signed)
Chronic Care Management Pharmacy Note  12/12/2021 Name:  [name] MRN:  [id number] DOB:  1945-09-29  Summary: Patient presents for CCM follow-up. She has been lost to follow-up for me since 2021. Currently reporting affordability challenges with Eliquis. Has not been taking atorvastatin.  Recommendations/Changes made from today's visit: Continue current medications  Refill of atorvastatin sent.   Plan: CPP follow-up 1 month    Subjective: [name] is an 76 y.o. year old female who is a primary patient of [name], [name], NP.  The CCM team was consulted for assistance with disease management and care coordination needs.    Engaged with patient by telephone for follow up visit in response to provider referral for pharmacy case management and/or care coordination services.   Consent to Services:  The patient was given information about Chronic Care Management services, agreed to services, and gave verbal consent prior to initiation of services.  Please see initial visit note for detailed documentation.   Patient Care Team: [name], [name], NP as PCP - General (Internal Medicine) Bensimhon, Shaune Pascal, MD as PCP - Advanced Heart Failure (Cardiology) Sueanne Margarita, MD as Consulting Physician (Cardiology) Paralee Cancel, MD as Consulting Physician (Orthopedic Surgery) Fleet Contras, MD as Consulting Physician (Nephrology) Obgyn, Myles Lipps, Glean Salvo, [facility] as Pharmacist (Pharmacist) Dannielle Karvonen, RN as [place] Management  Recent office visits: 11/14/2021 Wilfred Lacy NP (PCP) start Lactulose 20 g PRN, Decrease cored to 3.$RemoveBe'125mg'HcffgPQIS$  BID, Change pantoprazole 20 mg daily, Increase fluoxetine to $RemoveBefor'20mg'dgMAxaYltdGQ$  daily.,Ambulatory referral to Psychiatry, Return in about 4 weeks   Recent consult visits: 11/21/21: Patient presented to Dr. Stanford Breed (Vascular) for ESRD.  11/08/2021 Frann Rider NP (Neurology) No Medication Changes noted, Follow up 4  months 10/31/2021 Bambi Cottle LCSW (Behavioral Health) Unable to see note  Hospital visits: None in previous 6 months   Objective:  Lab Results  Component Value Date   CREATININE 3.09 (H) 11/29/2021   BUN 43 (H) 11/29/2021   GFR 13.63 (LL) 07/21/2021   GFRNONAA 15 (L) 11/29/2021   GFRAA 24 (L) 03/13/2020   NA 137 11/29/2021   K 4.3 11/29/2021   CALCIUM 9.3 11/29/2021   CO2 17 (L) 11/29/2021   GLUCOSE 88 11/29/2021    Lab Results  Component Value Date/Time   HGBA1C 4.7 (L) 07/23/2021 02:00 PM   HGBA1C 5.4 11/03/2020 11:36 AM   GFR 13.63 (LL) 07/21/2021 12:01 PM   GFR 13.66 (LL) 03/29/2021 02:52 PM    Last diabetic Eye exam: No results found for: HMDIABEYEEXA  Last diabetic Foot exam: No results found for: HMDIABFOOTEX   Lab Results  Component Value Date   CHOL 124 07/24/2021   HDL 34 (L) 07/24/2021   LDLCALC 71 07/24/2021   TRIG 95 07/24/2021   CHOLHDL 3.6 07/24/2021       Latest Ref Rng & Units 07/26/2021    3:37 AM 07/25/2021    6:20 AM 07/23/2021   10:43 AM  Hepatic Function  Total Protein 6.5 - 8.1 g/dL   4.6  C  Albumin 3.5 - 5.0 g/dL 2.1   2.3   2.4  C  AST 15 - 41 U/L   47  C  ALT 0 - 44 U/L   10  C  Alk Phosphatase 38 - 126 U/L   54  C  Total Bilirubin 0.3 - 1.2 mg/dL   1.6  C    C Corrected result    Lab Results  Component  Value Date/Time   TSH 1.765 07/23/2021 01:22 PM   TSH 1.450 11/27/2019 04:16 PM   TSH 2.655 09/11/2011 08:25 AM       Latest Ref Rng & Units 11/24/2021    8:30 AM 11/10/2021    2:09 PM 10/27/2021   12:34 PM  CBC  Hemoglobin 12.0 - 15.0 g/dL 11.0   10.2   9.5      Lab Results  Component Value Date/Time   VD25OH <10 (L) 09/18/2011 05:35 AM   VD25OH 13 (L) 09/11/2011 08:25 AM    Clinical ASCVD: Yes  The ASCVD Risk score (Arnett DK, et al., 2019) failed to calculate for the following reasons:   The patient has a prior MI or stroke diagnosis       12/05/2021   11:05 AM 12/05/2021   11:01 AM 12/05/2021   11:00 AM   Depression screen PHQ 2/9  Decreased Interest 0 0 0  Down, Depressed, Hopeless 0 0 0  PHQ - 2 Score 0 0 0    Social History   Tobacco Use  Smoking Status Never  Smokeless Tobacco Never   BP Readings from Last 3 Encounters:  12/08/21 (!) 150/74  11/29/21 120/60  11/24/21 133/71   Pulse Readings from Last 3 Encounters:  12/08/21 70  11/29/21 70  11/24/21 72   Wt Readings from Last 3 Encounters:  11/29/21 109 lb 3.2 oz (49.5 kg)  11/21/21 108 lb (49 kg)  11/14/21 108 lb (49 kg)   BMI Readings from Last 3 Encounters:  11/29/21 17.90 kg/m  11/21/21 17.70 kg/m  11/14/21 17.70 kg/m    Assessment/Interventions: Review of patient past medical history, allergies, medications, health status, including review of consultants reports, laboratory and other test data, was performed as part of comprehensive evaluation and provision of chronic care management services.   SDOH:  (Social Determinants of Health) assessments and interventions performed: Yes  SDOH Screenings   Alcohol Screen: Low Risk    Last Alcohol Screening Score (AUDIT): 0  Depression (PHQ2-9): Low Risk    PHQ-2 Score: 0  Financial Resource Strain: Low Risk    Difficulty of Paying Living Expenses: Not hard at all  Food Insecurity: No Food Insecurity   Worried About Charity fundraiser in the Last Year: Never true   Ran Out of Food in the Last Year: Never true  Housing: Low Risk    Last Housing Risk Score: 0  Physical Activity: Inactive   Days of Exercise per Week: 0 days   Minutes of Exercise per Session: 0 min  Social Connections: Engineer, building services of Communication with Friends and Family: Twice a week   Frequency of Social Gatherings with Friends and Family: Twice a week   Attends Religious Services: More than 4 times per year   Active Member of [employer] or Organizations: Yes   Attends Archivist Meetings: 1 to 4 times per year   Marital Status: Married  Stress: No Stress Concern  Present   Feeling of Stress : Not at all  Tobacco Use: Low Risk    Smoking Tobacco Use: Never   Smokeless Tobacco Use: Never   Passive Exposure: Not on file  Transportation Needs: No Transportation Needs   Lack of Transportation (Medical): No   Lack of Transportation (Non-Medical): No    CCM Care Plan  Allergies  Allergen Reactions   Warfarin Sodium Other (See Comments)    Caused her to have a stroke/left side of brain to hemorrhage  Ace Inhibitors Cough   Amiodarone Other (See Comments)    Pt with Restrictive Lung Disease by 10/2017 PFT with decreased DLCO   Amlodipine Swelling    Swelling in feet    Medications Reviewed Today     Reviewed by Marko Stai, RN (Registered Nurse) on 12/08/21 at [phone]  Med List Status: <None>   Medication Order Taking? Sig Documenting Provider Last Dose Status Informant  acetaminophen (TYLENOL) 500 MG tablet [id number]  Take 500 mg by mouth every 6 (six) hours as needed for mild pain or headache.  [provider]  Active Self  albuterol (PROVENTIL) (2.5 MG/3ML) 0.083% nebulizer solution [id number]  Take 3 mLs (2.5 mg total) by nebulization every 6 (six) hours as needed for wheezing or shortness of breath. Dx:J45.909 [name], [name], NP  Active Self  albuterol (VENTOLIN HFA) 108 (90 Base) MCG/ACT inhaler [id number]  INHALE 1-2 PUFFS BY MOUTH EVERY 6 HOURS AS NEEDED FOR WHEEZE OR SHORTNESS OF BREATH [name], [name], NP  Active   apixaban (ELIQUIS) 2.5 MG TABS tablet [id number]  Take 1 tablet (2.5 mg total) by mouth 2 (two) times daily. Bensimhon, Shaune Pascal, MD  Active   atorvastatin (LIPITOR) 20 MG tablet [id number]  Take 1 tablet (20 mg total) by mouth daily. [name], NP  Active   budesonide-formoterol [facility]) 160-4.5 MCG/ACT inhaler [id number]  Inhale 2 puffs into the lungs 2 (two) times daily. [name], MD  Active Self  carvedilol (COREG) 3.125 MG tablet [id number]  Take 1 tablet (3.125 mg total) by mouth 2 (two)  times daily with a meal. [name], [name], NP  Active   docusate sodium (COLACE) 100 MG capsule [id number]  Take 100 mg by mouth daily. [provider]  Active   FLUoxetine (PROZAC) 20 MG capsule [id number]  Take 1 capsule (20 mg total) by mouth daily. [name], [name], NP  Active   furosemide (LASIX) 80 MG tablet [id number]  Take 80 mg by mouth 3 (three) times a week. [provider]  Active   lactulose (CHRONULAC) 10 GM/15ML solution [id number]  Take 30 mLs (20 g total) by mouth daily as needed for mild constipation or severe constipation. [name], NP  Active   mycophenolate (MYFORTIC) 180 MG EC tablet [id number]  Take 180 mg by mouth 2 (two) times daily. [provider]  Active Self           Med Note [name], MACI D   Thu Oct 22, 2019  5:55 PM)    ondansetron (ZOFRAN) 4 MG tablet [id number]  Take 1 tablet (4 mg total) by mouth every 8 (eight) hours as needed for up to 9 doses for nausea. [name], MD  Active Self  pantoprazole (PROTONIX) 20 MG tablet [id number]  Take 1 tablet (20 mg total) by mouth daily. [name], [name], NP  Active   potassium chloride (MICRO-K) 10 MEQ CR capsule [id number]  Take 10 mEq by mouth 3 (three) times a week. [provider]  Active   predniSONE (DELTASONE) 5 MG tablet [id number]  Take 5 mg by mouth daily. [provider]  Active Self  tacrolimus (PROGRAF) 1 MG capsule [id number]  Take 1 mg by mouth 2 (two) times daily. 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   Immunocompromised patient ([place]) 08/13/2021   Chronic atrial fibrillation (HCC)    Right leg weakness 07/23/2021   Erosive esophagitis/esophageal ulceration/duodenal ulceration 07/23/2021   Edema of right lower leg 07/23/2021    Acute GI bleeding 07/08/2021   Azotemia 07/08/2021   Pressure injury of skin 07/08/2021   Asthma, chronic, unspecified asthma severity, with acute exacerbation 06/15/2021   Chronic combined systolic and diastolic CHF (congestive heart failure) ([place]) 06/15/2021   Duodenal ulcer 03/29/2021   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) 02/11/2018   Hyperkalemia 02/03/2018   Lower extremity edema 12/10/2017   Presence of Watchman left atrial appendage closure device 10/04/2017   Dyspnea 07/26/2017   Atrial flutter ([place])    Oligouria    Hypertension    Fractures, stress    Eczema    Depression    Constipation    Arthritis    Exacerbation of asthma 04/01/2017   Closed fracture of right distal radius 10/18/2016   Proteinuria 09/04/2016   History of kidney transplant 2017 07/25/2015   Immunosuppression ([place]) 07/25/2015   Nonischemic dilated cardiomyopathy (HCC)    S/P insertion of IVC (inferior vena caval) filter 04/07/2013   Use of cane as ambulatory aid 04/07/2013   S/P hip replacement 03/25/2012   Personal history of DVT/PE s/p IVC filter 12/08/2011   Obstructive sleep apnea 12/08/2011   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/24/2011   CKD (chronic kidney disease), stage V ([place]) 09/14/2011   Hypercalcemia    Gout 09/10/2011   Acute blood loss anemia secondary to diverticular bleed  09/04/2011   Absence of right hip joint with antibiotic pacer 09/03/2011   Traumatic seroma of thigh ([place]) 06/11/2011   Clostridium difficile colitis  06/09/2011   Leukocytosis 06/08/2011   Symptomatic anemia 06/06/2011   SINUSITIS- ACUTE-NOS 09/27/2010   Atrial fibrillation s/p AVN ablation/PPM implant, s/p LAAC with Watchman 11/25/2008   Cerebral artery occlusion with cerebral infarction ([place]) 09/28/2008   History of CVA with residual deficit 09/28/2008   Hemiplegia ([place]) 09/21/2008   Psoriasis 10/20/2007   Asthmatic bronchitis without complication 96/28/3662    Immunization History  Administered Date(s) Administered   Fluad Quad(high Dose 65+) 04/28/2019, 05/02/2020   Influenza Split 03/27/2012   Influenza Whole 04/29/2008   Influenza, High Dose Seasonal PF 04/19/2017, 03/16/2018   Influenza,inj,Quad PF,6+ Mos 04/24/2013   Influenza-Unspecified 04/29/2014, 03/17/2015, 03/29/2021   PFIZER(Purple Top)SARS-COV-2 Vaccination 09/09/2019, 09/23/2019, 06/24/2020   Pneumococcal Conjugate-13 03/02/2014   Pneumococcal Polysaccharide-23 04/25/2005, 09/18/2016   Tdap 05/08/2013    Conditions to be addressed/monitored:  Hypertension, Hyperlipidemia, Atrial Fibrillation, Heart Failure, Asthma, Depression, and Renal Transplant  Care Plan : General Pharmacy (Adult)  Updates made by Germaine Pomfret, [place] since 12/12/2021 12:00 AM     Problem: Hypertension, Hyperlipidemia, Atrial Fibrillation, Heart Failure, Asthma, Depression, and Renal Transplant   Priority: High     Long-Range Goal: Patient-Specific Goal   Start Date: 12/12/2021  Expected End Date: 12/13/2022  This Visit's Progress: On track  Priority: High  Note:   Current Barriers:  Unable to independently afford treatment  regimen  Pharmacist Clinical Goal(s):  Patient will verbalize ability to afford treatment regimen through collaboration with PharmD and provider.   Interventions: 1:1 collaboration with [name], [name], NP regarding development and update of comprehensive plan of care as evidenced by provider attestation and co-signature Inter-disciplinary care team  collaboration (see longitudinal plan of care) Comprehensive medication review performed; medication list updated in electronic medical record  Hypertension (BP goal <140/90) -Controlled -Current treatment: Carvedilol 3.125 mg twice daily  Furosemide 80 mg three times weekly -Medications previously tried: NA  -Current home readings: NA -Denies hypotensive/hypertensive symptoms -Recommended to continue current medication  Hyperlipidemia: (LDL goal < 100) -Controlled -Current treatment: Atorvastatin 20 mg daily -Medications previously tried: NA  -Recommended to continue current medication  Atrial Fibrillation (Goal: prevent stroke and major bleeding) -Controlled -CHADSVASC: 6 -Current treatment: Rate control: Carvedilol 3.125 mg twice daily  Anticoagulation: Eliquis 2.5 mg twice daily  -Medications previously tried: NA -PAP for Eliquis in progress. -Recommended to continue current medication  COPD (Goal: control symptoms and prevent exacerbations) -Controlled -Current treatment  Symbicort 160-4.5 mcg/act 2 puffs twice daily as needed  -Medications previously tried: NA  -Exacerbations requiring treatment in last 6 months: None -Patient reports consistent use of maintenance inhaler -Recommended to continue current medication  Depression/Anxiety (Goal: Achieve symptom remission) -Controlled -Current treatment: Fluoxetine 20 mg daily  -Medications previously tried/failed: NA -Recommended to continue current medication  History of Renal Transplant (Goal: Preserve kidney function) -Controlled -Current treatment  Mycophenolate EC 180 mg twice daily  Prednisone 5 mg daily  Tacrolimus 1 mg twice daily  -Medications previously tried: NA  -Recommended to continue current medication  Patient Goals/Self-Care Activities Patient will:  - check blood pressure weekly, document, and provide at future appointments  Follow Up Plan: Telephone follow up appointment with care  management team member scheduled for:  12/14/2021 at 9:15 AM      Medication Assistance: Application for Eliquis  medication assistance program. in process.  Anticipated assistance start date TBD.  See plan of care for additional detail.  Compliance/Adherence/Medication fill history: Care Gaps: Shingrix Vaccine COVID-19 Vaccine (4- Booster)  Star-Rating Drugs: Atorvastatin 20 mg last filled [facility]/30/20[facility]  Patient's preferred pharmacy is:  [id number] [place], [place] [address] [place] [address] Phone: [phone] Fax: [phone]  [place] [place] [address] Phone: [phone] Fax: [phone]  Uses pill box? Yes Pt endorses 100% compliance  We discussed: Current pharmacy is preferred with insurance plan and patient is satisfied with pharmacy services Patient decided to: Continue current medication management strategy  Care Plan and Follow Up Patient Decision:  Patient agrees to Care Plan and Follow-up.  Plan: Telephone follow up appointment with care management team member scheduled for:  12/14/2021 at [place], PharmD, Para March, CPP Clinical Pharmacist Practitioner  [facility]  [phone]

## 2021-11-24 ENCOUNTER — Encounter (HOSPITAL_COMMUNITY): Payer: Medicare Other

## 2021-11-24 ENCOUNTER — Ambulatory Visit (HOSPITAL_COMMUNITY)
Admission: RE | Admit: 2021-11-24 | Discharge: 2021-11-24 | Disposition: A | Discharge status: Home / Self Care | Payer: Medicare Other | Source: Ambulatory Visit | Attending: Nephrology | Admitting: Nephrology

## 2021-11-24 VITALS — BP 133/71 | HR 72 | Temp 97.7°F | Resp 20

## 2021-11-24 DIAGNOSIS — N185 Chronic kidney disease, stage 5: Secondary | ICD-10-CM | POA: Diagnosis not present

## 2021-11-24 LAB — IRON AND TIBC
Iron: 29 ug/dL (ref 28–170)
Saturation Ratios: 14 % (ref 10.4–31.8)
TIBC: 206 ug/dL — ABNORMAL LOW (ref 250–450)
UIBC: 177 ug/dL

## 2021-11-24 LAB — POCT HEMOGLOBIN-HEMACUE: Hemoglobin: 11 g/dL — ABNORMAL LOW (ref 12.0–15.0)

## 2021-11-24 LAB — FERRITIN: Ferritin: 210 ng/mL (ref 11–307)

## 2021-11-24 MED ORDER — EPOETIN ALFA-EPBX 10000 UNIT/ML IJ SOLN
INTRAMUSCULAR | Status: AC
  Administered 2021-11-24: 20000 [IU] via SUBCUTANEOUS
  Filled 2021-11-24: qty 2

## 2021-11-24 MED ORDER — EPOETIN ALFA-EPBX 10000 UNIT/ML IJ SOLN: 20000.0000 [IU] | INTRAMUSCULAR | Status: DC

## 2021-11-27 DIAGNOSIS — Z462 Encounter for fitting and adjustment of other devices related to nervous system and special senses: Secondary | ICD-10-CM | POA: Diagnosis not present

## 2021-11-27 DIAGNOSIS — I4891 Unspecified atrial fibrillation: Secondary | ICD-10-CM | POA: Diagnosis not present

## 2021-11-28 ENCOUNTER — Ambulatory Visit (INDEPENDENT_AMBULATORY_CARE_PROVIDER_SITE_OTHER): Payer: Medicare Other | Admitting: Psychology

## 2021-11-28 ENCOUNTER — Other Ambulatory Visit: Payer: Self-pay | Admitting: *Deleted

## 2021-11-28 DIAGNOSIS — F331 Major depressive disorder, recurrent, moderate: Secondary | ICD-10-CM | POA: Diagnosis not present

## 2021-11-28 DIAGNOSIS — I509 Heart failure, unspecified: Secondary | ICD-10-CM

## 2021-11-28 DIAGNOSIS — Z45018 Encounter for adjustment and management of other part of cardiac pacemaker: Secondary | ICD-10-CM | POA: Diagnosis not present

## 2021-11-28 DIAGNOSIS — I4891 Unspecified atrial fibrillation: Secondary | ICD-10-CM | POA: Diagnosis not present

## 2021-11-28 NOTE — Progress Notes (Signed)
[place] Counselor/Therapist Progress Note ? ?Patient ID: [name], MRN: [id number],   ? ?Date: 11/28/2021 ? ?Time Spent: 60 minutes ? ?Treatment Type: Individual Therapy ? ?Reported Symptoms: depression, decreased motivation ? ?Mental Status Exam: ?Appearance:  Casual     ?Behavior: Appropriate  ?Motor: Shuffling Gait  ?Speech/Language:  Garbled  ?Affect: Blunt  ?Mood: normal  ?Thought process: normal  ?Thought content:   WNL  ?Sensory/Perceptual disturbances:   WNL  ?Orientation: oriented to person, place, time/date, and situation  ?Attention: Good  ?Concentration: Good  ?Memory: Recent;   Fair  ?Fund of knowledge:  Good  ?Insight:   Fair  ?Judgment:  Good  ?Impulse Control: Good  ? ?Risk Assessment: ?Danger to Self:  No ?Self-injurious Behavior: No ?Danger to Others: No ?Duty to Warn:no ?Physical Aggression / Violence:No  ?Access to Firearms a concern: No  ?Gang Involvement:No  ? ?Subjective: The patient attended a face to face individual therapy session.  The patient reports that she is doing okay.  She talked about still struggling with not being able to do what she used to do.  We talked about it not being that long since she had the stroke.  We discussed schedules and talked about her goal as being to get herself on a better sleeping and getting up schedule and also a better eating schedule.  Encouraged patient to try to do this between now and our next session.   ? ?Interventions: Cognitive Behavioral Therapy and Solution-Oriented/Positive Psychology ? ?Diagnosis:Major depressive disorder, recurrent episode, moderate ([place]) ? ?Plan: Please see plan in therapy charts with target date of 10/19/2022.  The patient approved this plan and is seen every two to three weeks. ? ?Ean Gettel G Avyanna Spada, LCSW ? ? ? ?

## 2021-11-28 NOTE — Patient Outreach (Signed)
?[facility] [facility]) Care Management ?Telephonic RN Care Manager Note ? ? ?11/28/2021 ?Name:  [name] MRN:  [id number] DOB:  [birth year] ? ?Summary: ?No immediately needs as pt indicates she is doing better. ? ?Recommendations/Changes made from today's visit: ?Explained to pt that her provider's office has an embedded care manager who will continue to address her ongoing needs related to her CHF. Case Closed. ? ?Subjective: ?[name] is an 76 y.o. year old female who is a primary patient of [name], Jane Brooke, NP. The care management team was consulted for assistance with care management and/or care coordination needs.   ? ?Telephonic RN Care Manager completed Telephone Visit today. ? ?Objective:  ? ?Medications Reviewed Today   ? ? Reviewed by Leonarda Salon, RN (Registered Nurse) on 11/24/21 at [place] List Status: <None>  ? ?Medication Order Taking? Sig Documenting Provider Last Dose Status Informant  ?acetaminophen (TYLENOL) 500 MG tablet [id number]  Take 500 mg by mouth every 6 (six) hours as needed for mild pain or headache.  [provider]  Active Self  ?albuterol (PROVENTIL) (2.5 MG/3ML) 0.083% nebulizer solution [id number]  Take 3 mLs (2.5 mg total) by nebulization every 6 (six) hours as needed for wheezing or shortness of breath. Dx:J45.909 [name], Jane Brooke, NP  Active Self  ?albuterol (VENTOLIN HFA) 108 (90 Base) MCG/ACT inhaler [id number]  INHALE 1-2 PUFFS BY MOUTH EVERY 6 HOURS AS NEEDED FOR WHEEZE OR SHORTNESS OF BREATH [name], Jane Brooke, NP  Active   ?apixaban (ELIQUIS) 2.5 MG TABS tablet [id number]  Take 1 tablet (2.5 mg total) by mouth 2 (two) times daily.  ?Patient not taking: Reported on 11/23/2021  ? [name], Jane Brooke, NP  Active   ?atorvastatin (LIPITOR) 20 MG tablet [id number]  Take 1 tablet (20 mg total) by mouth daily. Jane Buffy, NP  Active   ?budesonide-formoterol [facility]) 160-4.5 MCG/ACT inhaler [id number]  Inhale 2 puffs into the lungs 2 (two)  times daily.  ?Patient taking differently: Inhale 2 puffs into the lungs 2 (two) times daily. Takes differently as needed  ? Jane Noel, MD  Active Self  ?calcitRIOL (ROCALTROL) 0.25 MCG capsule [id number]  Take 0.25 mcg by mouth every other day. Takes M-W-F only [provider]  Active   ?carvedilol (COREG) 3.125 MG tablet [id number]  Take 1 tablet (3.125 mg total) by mouth 2 (two) times daily with a meal. [name], Jane Brooke, NP  Active   ?docusate sodium (COLACE) 100 MG capsule [id number]  Take 1 capsule (100 mg total) by mouth 2 (two) times daily. [name], Jane Brooke, NP  Active   ?FLUoxetine (PROZAC) 20 MG capsule [id number]  Take 1 capsule (20 mg total) by mouth daily. [name], Jane Brooke, NP  Active   ?furosemide (LASIX) 80 MG tablet [id number]  Take 80 mg by mouth 3 (three) times a week. [provider]  Active   ?lactulose (CHRONULAC) 10 GM/15ML solution [id number]  Take 30 mLs (20 g total) by mouth daily as needed for mild constipation or severe constipation. Jane Buffy, NP  Active   ?mycophenolate (MYFORTIC) 180 MG EC tablet [id number]  Take 180 mg by mouth 2 (two) times daily. [provider]  Active Self  ?         ?Med Note [name], MACI D   Thu Oct 22, 2019  5:55 PM)    ?ondansetron (ZOFRAN) 4 MG tablet [id number]  Take 1 tablet (4 mg total) by mouth every 8 (eight) hours  as needed for up to 9 doses for nausea. [name]., MD  Active Self  ?pantoprazole (PROTONIX) 20 MG tablet [id number]  Take 1 tablet (20 mg total) by mouth daily. [name], Jane Brooke, NP  Active   ?potassium chloride (KLOR-CON) 10 MEQ tablet [id number]  Take 10 mEq by mouth daily. [provider]  Active   ?predniSONE (DELTASONE) 5 MG tablet [id number]  Take 5 mg by mouth daily. [provider]  Active Self  ?sucralfate (CARAFATE) 1 g tablet [id number]  Take 1 tablet (1 g total) by mouth 4 (four) times daily. [name], MD  Expired 08/12/21 2359 Self  ?tacrolimus (PROGRAF)  1 MG capsule [id number]  Take 1 mg by mouth 2 (two) times daily. [provider]  Active Self  ? ?  ?  ? ?  ? ? ? ?SDOH:  (Social Determinants of Health) assessments and interventions performed:  ? ? ? ?Care Plan ? ?Review of patient past medical history, allergies, medications, health status, including review of consultants reports, laboratory and other test data, was performed as part of comprehensive evaluation for care management services.  ? ?Care Plan : RN RN Care Manager plan of care  ?Updates made by Tobi Bastos, RN since 11/28/2021 12:00 AM  ?  ? ?Problem: Knowledge deficit related to Heart Failure and care coordination needs   ?Priority: High  ?  ? ?Long-Range Goal: Development plan of care for management of Heart Failure Completed 11/28/2021  ?Start Date: 10/24/2021  ?Expected End Date: 05/15/2022  ?Priority: High  ?Note:   ?Current Barriers:  ?Knowledge Deficits related to plan of care for management of CHF  ? ?RNCM Clinical Goal(s):  ?Patient will verbalize basic understanding of  CHF disease process and self health management plan as evidenced by self report and chart review ?take all medications exactly as prescribed and will call provider for medication related questions as evidenced by self report and chart review  through collaboration with RN Care manager, provider, and care team.  ? ?Interventions: ?Inter-disciplinary care team collaboration (see longitudinal plan of care) ?Evaluation of current treatment plan related to  self management and patient's adherence to plan as established by provider ? ? ?Heart Failure Interventions:  (Status:  New goal.) Long Term Goal ?Basic overview and discussion of pathophysiology of Heart Failure reviewed ?Provided education on low sodium diet ?Reviewed Heart Failure Action Plan in depth and provided written copy ?Assessed need for readable accurate scales in home ?Provided education about placing scale on hard, flat surface ?Advised patient to weigh  each morning after emptying bladder ?Discussed importance of daily weight and advised patient to weigh and record daily ?Reviewed role of diuretics in prevention of fluid overload and management of heart failure; ?Discussed the importance of keeping all appointments with provider ?Provided patient with education about the role of exercise in the management of heart failure ?Advised patient to discuss ongoing limitation with exercises for Silver Sneakers at the gym with provider ?Screening for signs and symptoms of depression related to chronic disease state  ? ?Patient Goals/Self-Care Activities: ?Take all medications as prescribed ?Attend all scheduled provider appointments ?Call pharmacy for medication refills 3-7 days in advance of running out of medications ?Attend church or other social activities ?Perform all self care activities independently  ?Perform IADL's (shopping, preparing meals, housekeeping, managing finances) independently ?Call provider office for new concerns or questions  ?call office if I gain more than 2 pounds in one day or 5 pounds in  one week ?do ankle pumps when sitting ?keep legs up while sitting ?track weight in diary ?use salt in moderation ?watch for swelling in feet, ankles and legs every day ?weigh myself daily ?begin a heart failure diary ?bring diary to all appointments ?develop a rescue plan ?follow rescue plan if symptoms flare-up ?eat more whole grains, fruits and vegetables, lean meats and healthy fats ?know when to call the doctor:3 lbs overnight and 5 lbs within one week ?track symptoms and what helps feel better or worse ?dress right for the weather, hot or cold ? ?Follow Up Plan:  Telephone follow up appointment with care management team member scheduled for:  May 2023 ?The patient has been provided with contact information for the care management team and has been advised to call with any health related questions or concerns.   ? ?12/05/2021 Pt has an [place]  to address her ongoing needs. Case will be closed via [place]. ?  ? ? ?Raina Mina, RN ?Care Management Coordinator ?[place] ?Main Office [phone]  ? ? ?

## 2021-11-29 ENCOUNTER — Ambulatory Visit (HOSPITAL_COMMUNITY)
Admission: RE | Admit: 2021-11-29 | Discharge: 2021-11-29 | Disposition: A | Discharge status: Home / Self Care | Payer: Medicare Other | Source: Ambulatory Visit | Attending: Internal Medicine | Admitting: Internal Medicine

## 2021-11-29 ENCOUNTER — Encounter (HOSPITAL_COMMUNITY): Payer: Self-pay | Admitting: Internal Medicine

## 2021-11-29 ENCOUNTER — Other Ambulatory Visit (HOSPITAL_COMMUNITY): Payer: Self-pay

## 2021-11-29 ENCOUNTER — Telehealth: Payer: Self-pay | Admitting: *Deleted

## 2021-11-29 ENCOUNTER — Encounter (HOSPITAL_COMMUNITY): Payer: Self-pay

## 2021-11-29 VITALS — BP 120/60 | HR 70 | Wt 109.2 lb

## 2021-11-29 DIAGNOSIS — Z7901 Long term (current) use of anticoagulants: Secondary | ICD-10-CM | POA: Diagnosis not present

## 2021-11-29 DIAGNOSIS — N186 End stage renal disease: Secondary | ICD-10-CM | POA: Insufficient documentation

## 2021-11-29 DIAGNOSIS — I132 Hypertensive heart and chronic kidney disease with heart failure and with stage 5 chronic kidney disease, or end stage renal disease: Secondary | ICD-10-CM | POA: Insufficient documentation

## 2021-11-29 DIAGNOSIS — Z86718 Personal history of other venous thrombosis and embolism: Secondary | ICD-10-CM | POA: Diagnosis not present

## 2021-11-29 DIAGNOSIS — I4819 Other persistent atrial fibrillation: Secondary | ICD-10-CM | POA: Insufficient documentation

## 2021-11-29 DIAGNOSIS — N1832 Chronic kidney disease, stage 3b: Secondary | ICD-10-CM | POA: Insufficient documentation

## 2021-11-29 DIAGNOSIS — Z86711 Personal history of pulmonary embolism: Secondary | ICD-10-CM | POA: Diagnosis not present

## 2021-11-29 DIAGNOSIS — I480 Paroxysmal atrial fibrillation: Secondary | ICD-10-CM | POA: Diagnosis not present

## 2021-11-29 DIAGNOSIS — Z8673 Personal history of transient ischemic attack (TIA), and cerebral infarction without residual deficits: Secondary | ICD-10-CM | POA: Insufficient documentation

## 2021-11-29 DIAGNOSIS — I5022 Chronic systolic (congestive) heart failure: Secondary | ICD-10-CM | POA: Diagnosis not present

## 2021-11-29 DIAGNOSIS — I482 Chronic atrial fibrillation, unspecified: Secondary | ICD-10-CM | POA: Diagnosis not present

## 2021-11-29 DIAGNOSIS — I5033 Acute on chronic diastolic (congestive) heart failure: Secondary | ICD-10-CM | POA: Diagnosis not present

## 2021-11-29 DIAGNOSIS — Z940 Kidney transplant status: Secondary | ICD-10-CM | POA: Diagnosis not present

## 2021-11-29 LAB — BASIC METABOLIC PANEL
Anion gap: 11 (ref 5–15)
BUN: 43 mg/dL — ABNORMAL HIGH (ref 8–23)
CO2: 17 mmol/L — ABNORMAL LOW (ref 22–32)
Calcium: 9.3 mg/dL (ref 8.9–10.3)
Chloride: 109 mmol/L (ref 98–111)
Creatinine, Ser: 3.09 mg/dL — ABNORMAL HIGH (ref 0.44–1.00)
GFR, Estimated: 15 mL/min — ABNORMAL LOW (ref >60)
Glucose, Bld: 88 mg/dL (ref 70–99)
Potassium: 4.3 mmol/L (ref 3.5–5.1)
Sodium: 137 mmol/L (ref 135–145)

## 2021-11-29 MED ORDER — APIXABAN 2.5 MG PO TABS: 2.5000 mg | ORAL_TABLET | Freq: Two times a day (BID) | ORAL | 11 refills | Status: DC

## 2021-11-29 NOTE — Patient Instructions (Signed)
There has been no changes to your medications. ? ?Labs done today, your results will be available in MyChart, we will contact you for abnormal readings. ? ?Your physician has requested that you have an echocardiogram. Echocardiography is a painless test that uses sound waves to create images of your heart. It provides your doctor with information about the size and shape of your heart and how well your heart?s chambers and valves are working. This procedure takes approximately one hour. There are no restrictions for this procedure. ? ?PLEASE WEAR YOUR COMPRESSION HOSE ON YOUR LEGS  ? ?Your physician recommends that you schedule a follow-up appointment in: 3 months  ? ?If you have any questions or concerns before your next appointment please send [name] a message through [place] or call our office at [phone].   ? ?TO LEAVE A MESSAGE FOR THE NURSE SELECT OPTION 2, PLEASE LEAVE A MESSAGE INCLUDING: ?YOUR NAME ?DATE OF BIRTH ?CALL BACK NUMBER ?REASON FOR CALL**this is important as we prioritize the call backs ? ?YOU WILL RECEIVE A CALL BACK THE SAME DAY AS LONG AS YOU CALL BEFORE 4:00 PM ? ?At the [facility], you and your health needs are our priority. As part of our continuing mission to provide you with exceptional heart care, we have created designated Provider Care Teams. These Care Teams include your primary Cardiologist (physician) and Advanced Practice Providers (APPs- Physician Assistants and Nurse Practitioners) who all work together to provide you with the care you need, when you need it.  ? ?You may see any of the following providers on your designated Care Team at your next follow up: ?Dr Glori Bickers ?Dr Loralie Champagne ?Darrick Grinder, NP ?Lyda Jester, PA ?Jessica Milford,NP ?Marlyce Huge, PA ?Audry Riles, PharmD ? ? ?Please be sure to bring in all your medications bottles to every appointment.  ? ? ?

## 2021-11-29 NOTE — Progress Notes (Addendum)
? ?ADVANCED HF CLINIC  ?Referring Physician: Dr. Joan Mayans ?Primary Cardiologist: Dr. Fransico Him & Dr. Eldred Manges [facility]) ?Nephrology: Dr Hollie Salk ? ?HPI: ?[name] is a 76 y.o. 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 Joan Mayans), s/p LAA closure w Watchman device by Dr. Gelene Mink (09/2017), who underwent a successful closure of a crescent shaped perivalvular leak around the previously placed 33 mm watchman device with coiling system on 09/16/2018. Referred by Dr. Joan Mayans for further evaluation and local management of her HF,  ? ?Had ASD repair at 76 years of age. Denies h/o known CAD  ? ?Admitted to [facility] on 10/22/2019 for acute respiratory failure with hypoxia 2/2 COPD and acute HFpEF exacerbation. BNP 970.7. TTE revealed LVEF 55-60%, mild concentric LVH, RV volume overload, RVSP 60.7 mmHg. Severely dilated LA and RA. Dilated IVC. Marland KitchenDiuresed with IV lasix and discharged on home on Lasix 80 mg BID. Also treated with IV solumedrol, nebulizer and inhaler treatments, and IV Zithromax. Was transitioned to oral prednisone with taper at discharge and instructed to continue home Breo Ellipta and doxycycline.  ? ?Readmitted to [facility] on 11/14/2019 for GI bleeding (acute onset painless hematochezia), Hgb 7.4, requiring 2 units of pRBCs. Colonoscopy during the admission revealed diverticulosis. EGD was unremarkable. ASA was held on discharge. D/c with hemoglobin of 9.3. ? 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 Previously off anticoagulants due to GI bleed. Echo 35-40% . Placed on eliquis. Discharged to Rehab for 6 weeks. Discharged 4 weeks ago.  ? ?Today she returns for HF follow up with husband. Overall feeling fine. Funcitonal  limitation from stroke. RLE weak. Denies SOB/PND/Orthopnea.  Appetite ok. No fever or chills. Weight at home 106. pounds. Taking all medications but  she was unable to start elquis after discharge from SNF due to cost.  ? ?Echo 3/21 at [facility] EF 40-45: ?Echo 4/21 at [facility] EF 55-60% RVSP 61 mmHG ?Echo 07/2021 EF 35-40%   ? ? ?Past Medical History:  ?Diagnosis Date  ? Acute diastolic (congestive) heart failure ([place]) 08/12/2017  ? Acute GI bleeding 11/14/2019  ? Acute kidney injury superimposed on chronic kidney disease ([place]) 11/17/2017  ? AKI (acute kidney injury) ([place]) 11/04/2017  ? Anemia   ? Anxiety   ? ARF (acute renal failure) ([place]) 06/06/2011  ? Arthritis   ? "shoulders; arms; hips" (02/04/2018)  ? Asthma   ? Atrial fibrillation ([place])   ? Atrial flutter [facility])   ? s/p aflutter ablation at [facility]  ? Bacteriuria, asymptomatic 11/14/2020  ? Benign hypertension with ESRD (end-stage renal disease) ([place])   ? Blood transfusion   ? never had a reaction to blood transfusion  ? Cardiomyopathy Mar 2013  ? Mild, EF 50-55% by Mar 2013 ECHO, diast dysfxn II  ? Cardiomyopathy ([place])   ? CHF (congestive heart failure) ([place]) 2005  ? CHF (congestive heart failure) ([place])   ? Childhood asthma   ? Closed fracture of right distal radius 10/18/2016  ? CMV (cytomegalovirus infection) ([place]) 10/03/2015  ? Constipation   ? takes Miralax daily  ? Constipation   ? Depression   ? takes Zoloft daily  ? Distal radius fracture, right   ? DVT of lower extremity, bilateral ([place]) 12/21/11  ? "they're  there now; been there for 2 wks"  ? Eczema   ? End stage renal disease ([place]) 11/06/2011  ? ESRD (end stage renal disease) on dialysis [facility]) 09/2011  ? 07-19-2015 had Kidney transplant at [facility]; "don't get dialysis anymore" (02/04/2018)  ? Essential hypertension 07/07/2007  ? Qualifier: Diagnosis of  By: Garen Grams    ? Fractures, stress   ? in both feet--6 OR 7 YRS AGO--RESOLVED  ? Generalized edema [id number]  ? GI bleed 11/14/2019  ? Gout   ? doesn't require meds    ? HCAP (healthcare-associated pneumonia) 09/10/2011  ? Hearing difficulty   ? Hematoma of left lower leg 05/17/2020  ? High cholesterol   ? History of CVA (cerebrovascular accident) without residual deficits   ? History of hip replacement, total, right   ? History of pneumonia   ? Hx of Clostridium difficile infection   ? Hypercalcemia   ? 09/10/11  ? Hyperparathyroidism ([place]) 04/07/2013  ? Hypertension   ? takes Diltiazem daily   ? Hypomagnesemia 08/02/2015  ? Hypophosphatemia 11/08/2017  ? ICB (intracranial bleed) ([place])   ? Immunosuppression ([place]) 07/25/2015  ? Memory changes   ? Morbid obesity ([place])   ? Nonischemic dilated cardiomyopathy ([place])   ? Obesity 04/07/2013  ? Oligouria   ? Oral mucositis 02/22/2018  ? OSA on CPAP   ? PAF (paroxysmal atrial fibrillation) ([place])   ?  HX OF CEREBRAL BLEED WHILE ON COUMADIN-SO PT NOT ON ANY BLOOD THINNERS NOW  ? Peripheral vascular disease ([place])   ? Persistent atrial fibrillation ([place]) 08/12/2017  ? Overview:  Added automatically from request for surgery [phone]  Formatting of this note might be different from the original. Added automatically from request for surgery [phone]  ? Presence of Watchman left atrial appendage closure device 10/04/2017  ? Proteinuria 09/04/2016  ? Psoriasis 08/07/2016  ? 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/25/2015  ? S/P insertion of IVC (inferior vena caval) filter   ? Sepsis ([place]) 11/08/2017  ? Stress fracture   ? bilateral feet  ? Stroke [facility]) 2009  ? denies residual on 02/04/2018;  hemorrhagic now off coumadin  ? Stroke ([place]) 07/17/2007  ? denies residual, hemorrhagic now off coumadin  ? Stroke due to intracerebral hemorrhage [facility]) 2009  ? Suture reaction 09/04/2016  ? Transfusion history   ? Use of cane as ambulatory aid   ? ? ?Current Outpatient Medications  ?Medication Sig Dispense Refill  ? acetaminophen (TYLENOL) 500 MG tablet Take 500 mg by mouth every 6 (six) hours as needed for mild pain or headache.     ? albuterol (PROVENTIL) (2.5 MG/3ML) 0.083%  nebulizer solution Take 3 mLs (2.5 mg total) by nebulization every 6 (six) hours as needed for wheezing or shortness of breath. Dx:J45.909 150 mL 2  ? albuterol (VENTOLIN HFA) 108 (90 Base) MCG/ACT inhaler INHALE 1-2 PUFFS BY MOUTH EVERY 6 HOURS AS NEEDED FOR WHEEZE OR SHORTNESS OF BREATH 8.5 each 1  ? atorvastatin (LIPITOR) 20 MG tablet Take 1 tablet (20 mg total) by mouth daily. 90 tablet 1  ? carvedilol (COREG) 3.125 MG tablet Take 1 tablet (3.125 mg total) by mouth 2 (two) times daily with a meal. 180 tablet 3  ? docusate sodium (COLACE) 100 MG capsule Take 100 mg by mouth daily.    ? FLUoxetine (PROZAC) 20 MG capsule Take 1 capsule (20 mg total) by mouth daily. 90 capsule 1  ? furosemide (LASIX) 80 MG tablet Take 80 mg by mouth 3 (three) times  a week.    ? lactulose (CHRONULAC) 10 GM/15ML solution Take 30 mLs (20 g total) by mouth daily as needed for mild constipation or severe constipation. 236 mL 0  ? mycophenolate (MYFORTIC) 180 MG EC tablet Take 180 mg by mouth 2 (two) times daily.    ? ondansetron (ZOFRAN) 4 MG tablet Take 1 tablet (4 mg total) by mouth every 8 (eight) hours as needed for up to 9 doses for nausea. 9 tablet 0  ? pantoprazole (PROTONIX) 20 MG tablet Take 1 tablet (20 mg total) by mouth daily. 90 tablet 0  ? potassium chloride (MICRO-K) 10 MEQ CR capsule Take 10 mEq by mouth 3 (three) times a week.    ? predniSONE (DELTASONE) 5 MG tablet Take 5 mg by mouth daily.    ? tacrolimus (PROGRAF) 1 MG capsule Take 1 mg by mouth 2 (two) times daily.    ? apixaban (ELIQUIS) 2.5 MG TABS tablet Take 1 tablet (2.5 mg total) by mouth 2 (two) times daily. (Patient not taking: Reported on 11/23/2021) 180 tablet 1  ? budesonide-formoterol (SYMBICORT) 160-4.5 MCG/ACT inhaler Inhale 2 puffs into the lungs 2 (two) times daily. (Patient not taking: Reported on 11/29/2021) 1 each 5  ? ?No current facility-administered medications for this encounter.  ? ? ?Allergies  ?Allergen Reactions  ? Warfarin Sodium Other (See  Comments)  ?  Caused her to have a stroke/left side of brain to hemorrhage   ? Ace Inhibitors Cough  ? Amiodarone Other (See Comments)  ?  Pt with Restrictive Lung Disease by 10/2017 PFT with decreased DLCO

## 2021-11-29 NOTE — Chronic Care Management (AMB) (Signed)
?  Chronic Care Management  ? ?Note ? ?11/29/2021 ?Name: [name] MRN: [id number] DOB: 24-Dec-1945 ? ?[name] is a 76 y.o. year old female who is a primary care patient of [name], [name], NP. [name] is currently enrolled in care management services. An additional referral for RNCM was placed.  ? ?Follow up plan: ?Telephone appointment with care management team member scheduled for: 12/13/2021 ? ?Geffrey Michaelsen, CCMA ?Care Guide, Embedded Care Coordination ?[place]  Care Management  ?Direct Dial: [phone] ? ? ?

## 2021-11-30 ENCOUNTER — Telehealth: Payer: Self-pay | Admitting: Nurse Practitioner

## 2021-11-30 DIAGNOSIS — M150 Primary generalized (osteo)arthritis: Secondary | ICD-10-CM | POA: Diagnosis not present

## 2021-11-30 DIAGNOSIS — I4819 Other persistent atrial fibrillation: Secondary | ICD-10-CM | POA: Diagnosis not present

## 2021-11-30 DIAGNOSIS — Z7952 Long term (current) use of systemic steroids: Secondary | ICD-10-CM | POA: Diagnosis not present

## 2021-11-30 DIAGNOSIS — N2581 Secondary hyperparathyroidism of renal origin: Secondary | ICD-10-CM | POA: Diagnosis not present

## 2021-11-30 DIAGNOSIS — G4733 Obstructive sleep apnea (adult) (pediatric): Secondary | ICD-10-CM | POA: Diagnosis not present

## 2021-11-30 DIAGNOSIS — D631 Anemia in chronic kidney disease: Secondary | ICD-10-CM | POA: Diagnosis not present

## 2021-11-30 DIAGNOSIS — I132 Hypertensive heart and chronic kidney disease with heart failure and with stage 5 chronic kidney disease, or end stage renal disease: Secondary | ICD-10-CM | POA: Diagnosis not present

## 2021-11-30 DIAGNOSIS — Z86718 Personal history of other venous thrombosis and embolism: Secondary | ICD-10-CM | POA: Diagnosis not present

## 2021-11-30 DIAGNOSIS — K2210 Ulcer of esophagus without bleeding: Secondary | ICD-10-CM | POA: Diagnosis not present

## 2021-11-30 DIAGNOSIS — I428 Other cardiomyopathies: Secondary | ICD-10-CM | POA: Diagnosis not present

## 2021-11-30 DIAGNOSIS — H9190 Unspecified hearing loss, unspecified ear: Secondary | ICD-10-CM | POA: Diagnosis not present

## 2021-11-30 DIAGNOSIS — F419 Anxiety disorder, unspecified: Secondary | ICD-10-CM | POA: Diagnosis not present

## 2021-11-30 DIAGNOSIS — D849 Immunodeficiency, unspecified: Secondary | ICD-10-CM | POA: Diagnosis not present

## 2021-11-30 DIAGNOSIS — E46 Unspecified protein-calorie malnutrition: Secondary | ICD-10-CM | POA: Diagnosis not present

## 2021-11-30 DIAGNOSIS — E7800 Pure hypercholesterolemia, unspecified: Secondary | ICD-10-CM | POA: Diagnosis not present

## 2021-11-30 DIAGNOSIS — M1030 Gout due to renal impairment, unspecified site: Secondary | ICD-10-CM | POA: Diagnosis not present

## 2021-11-30 DIAGNOSIS — J45909 Unspecified asthma, uncomplicated: Secondary | ICD-10-CM | POA: Diagnosis not present

## 2021-11-30 DIAGNOSIS — I739 Peripheral vascular disease, unspecified: Secondary | ICD-10-CM | POA: Diagnosis not present

## 2021-11-30 DIAGNOSIS — Z7901 Long term (current) use of anticoagulants: Secondary | ICD-10-CM | POA: Diagnosis not present

## 2021-11-30 DIAGNOSIS — Z7951 Long term (current) use of inhaled steroids: Secondary | ICD-10-CM | POA: Diagnosis not present

## 2021-11-30 DIAGNOSIS — I69351 Hemiplegia and hemiparesis following cerebral infarction affecting right dominant side: Secondary | ICD-10-CM | POA: Diagnosis not present

## 2021-11-30 DIAGNOSIS — N185 Chronic kidney disease, stage 5: Secondary | ICD-10-CM | POA: Diagnosis not present

## 2021-11-30 DIAGNOSIS — I5042 Chronic combined systolic (congestive) and diastolic (congestive) heart failure: Secondary | ICD-10-CM | POA: Diagnosis not present

## 2021-11-30 DIAGNOSIS — F330 Major depressive disorder, recurrent, mild: Secondary | ICD-10-CM | POA: Diagnosis not present

## 2021-11-30 DIAGNOSIS — Z9181 History of falling: Secondary | ICD-10-CM | POA: Diagnosis not present

## 2021-11-30 NOTE — Telephone Encounter (Signed)
Requesting orders to extend pts PT 1 time a week for 4 weeks.   CB, [place] [phone], Can leave message.

## 2021-12-05 ENCOUNTER — Ambulatory Visit (INDEPENDENT_AMBULATORY_CARE_PROVIDER_SITE_OTHER): Payer: Medicare Other

## 2021-12-05 DIAGNOSIS — N2581 Secondary hyperparathyroidism of renal origin: Secondary | ICD-10-CM | POA: Diagnosis not present

## 2021-12-05 DIAGNOSIS — D631 Anemia in chronic kidney disease: Secondary | ICD-10-CM | POA: Diagnosis not present

## 2021-12-05 DIAGNOSIS — I132 Hypertensive heart and chronic kidney disease with heart failure and with stage 5 chronic kidney disease, or end stage renal disease: Secondary | ICD-10-CM | POA: Diagnosis not present

## 2021-12-05 DIAGNOSIS — N185 Chronic kidney disease, stage 5: Secondary | ICD-10-CM | POA: Diagnosis not present

## 2021-12-05 DIAGNOSIS — I5042 Chronic combined systolic (congestive) and diastolic (congestive) heart failure: Secondary | ICD-10-CM | POA: Diagnosis not present

## 2021-12-05 DIAGNOSIS — M1030 Gout due to renal impairment, unspecified site: Secondary | ICD-10-CM | POA: Diagnosis not present

## 2021-12-05 DIAGNOSIS — Z0000 Encounter for general adult medical examination without abnormal findings: Secondary | ICD-10-CM

## 2021-12-05 NOTE — Telephone Encounter (Signed)
Left Kelly a VM, adv her to give [name] a call back to discuss.

## 2021-12-05 NOTE — Progress Notes (Signed)
Subjective:   [name] is a 76 y.o. female who presents for Medicare Annual (Subsequent) preventive examination.   I connected with [name] today by telephone and verified that I am speaking with the correct person using two identifiers. Location patient: home Location provider: work Persons participating in the virtual visit: patient, provider.   I discussed the limitations, risks, security and privacy concerns of performing an evaluation and management service by telephone and the availability of in person appointments. I also discussed with the patient that there may be a patient responsible charge related to this service. The patient expressed understanding and verbally consented to this telephonic visit.    Interactive audio and video telecommunications were attempted between this provider and patient, however failed, due to patient having technical difficulties OR patient did not have access to video capability.  We continued and completed visit with audio only.    Review of Systems     Cardiac Risk Factors include: advanced age (>38mn, >>53women)     Objective:    Today's Vitals   There is no height or weight on file to calculate BMI.     12/05/2021   11:01 AM 10/24/2021   12:49 PM 07/25/2021    5:50 AM 07/08/2021    3:26 AM 07/07/2021   12:23 PM 06/14/2021   12:21 PM 05/05/2020   10:01 AM  Advanced Directives  Does Patient Have a Medical Advance Directive? Yes Yes Yes Yes Yes Yes Yes  Type of AParamedicof APoloniaLiving will Living will;Healthcare Power of Attorney Living will;Healthcare Power of [place] will  [place] will [place] will  Does patient want to make changes to medical advance directive?   No - Patient declined No - Patient declined  No - Patient declined   Copy of HGlynnin Chart? No - copy requested  No -  copy requested No - copy requested  No - copy requested No - copy requested  Would patient like information on creating a medical advance directive?    No - Patient declined  No - Patient declined     Current Medications (verified) Outpatient Encounter Medications as of 12/05/2021  Medication Sig   acetaminophen (TYLENOL) 500 MG tablet Take 500 mg by mouth every 6 (six) hours as needed for mild pain or headache.    albuterol (PROVENTIL) (2.5 MG/3ML) 0.083% nebulizer solution Take 3 mLs (2.5 mg total) by nebulization every 6 (six) hours as needed for wheezing or shortness of breath. Dx:J45.909   albuterol (VENTOLIN HFA) 108 (90 Base) MCG/ACT inhaler INHALE 1-2 PUFFS BY MOUTH EVERY 6 HOURS AS NEEDED FOR WHEEZE OR SHORTNESS OF BREATH   apixaban (ELIQUIS) 2.5 MG TABS tablet Take 1 tablet (2.5 mg total) by mouth 2 (two) times daily.   atorvastatin (LIPITOR) 20 MG tablet Take 1 tablet (20 mg total) by mouth daily.   budesonide-formoterol (SYMBICORT) 160-4.5 MCG/ACT inhaler Inhale 2 puffs into the lungs 2 (two) times daily.   carvedilol (COREG) 3.125 MG tablet Take 1 tablet (3.125 mg total) by mouth 2 (two) times daily with a meal.   docusate sodium (COLACE) 100 MG capsule Take 100 mg by mouth daily.   FLUoxetine (PROZAC) 20 MG capsule Take 1 capsule (20 mg total) by mouth daily.   furosemide (LASIX) 80 MG tablet Take 80 mg by mouth 3 (three) times a week.   lactulose (CHRONULAC) 10 GM/15ML solution Take 30  mLs (20 g total) by mouth daily as needed for mild constipation or severe constipation.   mycophenolate (MYFORTIC) 180 MG EC tablet Take 180 mg by mouth 2 (two) times daily.   ondansetron (ZOFRAN) 4 MG tablet Take 1 tablet (4 mg total) by mouth every 8 (eight) hours as needed for up to 9 doses for nausea.   pantoprazole (PROTONIX) 20 MG tablet Take 1 tablet (20 mg total) by mouth daily.   potassium chloride (MICRO-K) 10 MEQ CR capsule Take 10 mEq by mouth 3 (three) times a week.   predniSONE  (DELTASONE) 5 MG tablet Take 5 mg by mouth daily.   tacrolimus (PROGRAF) 1 MG capsule Take 1 mg by mouth 2 (two) times daily.   No facility-administered encounter medications on file as of 12/05/2021.    Allergies (verified) Warfarin sodium, Ace inhibitors, Amiodarone, and Amlodipine   History: Past Medical History:  Diagnosis Date   Acute diastolic (congestive) heart failure (HCC) 08/12/2017   Acute GI bleeding 11/14/2019   Acute kidney injury superimposed on chronic kidney disease ([place]) 11/17/2017   AKI (acute kidney injury) ([place]) 11/04/2017   Anemia    Anxiety    ARF (acute renal failure) ([place]) 06/06/2011   Arthritis    "shoulders; arms; hips" (02/04/2018)   Asthma    Atrial fibrillation (HCC)    Atrial flutter ([place])    s/p aflutter ablation at [facility]   Bacteriuria, asymptomatic 11/14/2020   Benign hypertension with ESRD (end-stage renal disease) ([place])    Blood transfusion    never had a reaction to blood transfusion   Cardiomyopathy Mar 2013   Mild, EF 50-55% by Mar 2013 ECHO, diast dysfxn II   Cardiomyopathy ([place])    CHF (congestive heart failure) ([place]) 2005   CHF (congestive heart failure) ([place])    Childhood asthma    Closed fracture of right distal radius 10/18/2016   CMV (cytomegalovirus infection) ([place]) 10/03/2015   Constipation    takes Miralax daily   Constipation    Depression    takes Zoloft daily   Distal radius fracture, right    DVT of lower extremity, bilateral ([place]) 12/21/11   "they're there now; been there for 2 wks"   Eczema    End stage renal disease ([place]) 11/06/2011   ESRD (end stage renal disease) on dialysis [facility]) 09/2011   07-19-2015 had Kidney transplant at [facility]; "don't get dialysis anymore" (02/04/2018)   Essential hypertension 07/07/2007   Qualifier: Diagnosis of  By: Garen Grams     Fractures, stress    in both feet--6 OR 7 YRS AGO--RESOLVED   Generalized edema [id number]   GI bleed 11/14/2019   Gout    doesn't require meds    HCAP  (healthcare-associated pneumonia) 09/10/2011   Hearing difficulty    Hematoma of left lower leg 05/17/2020   High cholesterol    History of CVA (cerebrovascular accident) without residual deficits    History of hip replacement, total, right    History of pneumonia    Hx of Clostridium difficile infection    Hypercalcemia    09/10/11   Hyperparathyroidism ([place]) 04/07/2013   Hypertension    takes Diltiazem daily    Hypomagnesemia 08/02/2015   Hypophosphatemia 11/08/2017   ICB (intracranial bleed) ([place])    Immunosuppression ([place]) 07/25/2015   Memory changes    Morbid obesity ([place])    Nonischemic dilated cardiomyopathy ([place])    Obesity 04/07/2013   Oligouria    Oral mucositis 02/22/2018   OSA on  CPAP    PAF (paroxysmal atrial fibrillation) (HCC)     HX OF CEREBRAL BLEED WHILE ON COUMADIN-SO PT NOT ON ANY BLOOD THINNERS NOW   Peripheral vascular disease ([place])    Persistent atrial fibrillation ([place]) 08/12/2017   Overview:  Added automatically from request for surgery [id number]  Formatting of this note might be different from the original. Added automatically from request for surgery [id number]   Presence of Watchman left atrial appendage closure device 10/04/2017   Proteinuria 09/04/2016   Psoriasis 08/07/2016   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/25/2015   S/P insertion of IVC (inferior vena caval) filter    Sepsis ([place]) 11/08/2017   Stress fracture    bilateral feet   Stroke ([place]) 2009   denies residual on 02/04/2018;  hemorrhagic now off coumadin   Stroke ([place]) 07/17/2007   denies residual, hemorrhagic now off coumadin   Stroke due to intracerebral hemorrhage ([place]) 2009   Suture reaction 09/04/2016   Transfusion history    Use of cane as ambulatory aid    Past Surgical History:  Procedure Laterality Date   AV FISTULA PLACEMENT  09/28/2011   Procedure: ARTERIOVENOUS (AV) FISTULA CREATION;  Surgeon: Rosetta Posner, MD;  Location: [place];  Service: Vascular;  Laterality: Left;   BIOPSY   07/10/2021   Procedure: BIOPSY;  Surgeon: Irving Copas., MD;  Location: [facility];  Service: Gastroenterology;;   CARDIAC CATHETERIZATION     CARDIAC VALVE [place] DEFECT   CHOLECYSTECTOMY     1980's   COLONOSCOPY     COLONOSCOPY WITH PROPOFOL N/A 11/16/2019   Procedure: COLONOSCOPY WITH PROPOFOL;  Surgeon: Juanita Craver, MD;  Location: [facility] ENDOSCOPY;  Service: Endoscopy;  Laterality: N/A;   CYSTOSCOPY     many yrs ago   ESOPHAGOGASTRODUODENOSCOPY N/A 07/10/2021   Procedure: ESOPHAGOGASTRODUODENOSCOPY (EGD);  Surgeon: Irving Copas., MD;  Location: [facility];  Service: Gastroenterology;  Laterality: N/A;   ESOPHAGOGASTRODUODENOSCOPY (EGD) WITH PROPOFOL N/A 11/16/2019   Procedure: ESOPHAGOGASTRODUODENOSCOPY (EGD) WITH PROPOFOL;  Surgeon: Juanita Craver, MD;  Location: [facility] ENDOSCOPY;  Service: Endoscopy;  Laterality: N/A;   ESOPHAGOGASTRODUODENOSCOPY (EGD) WITH PROPOFOL N/A 03/20/2021   Procedure: ESOPHAGOGASTRODUODENOSCOPY (EGD) WITH PROPOFOL;  Surgeon: Milus Banister, MD;  Location: [facility] ENDOSCOPY;  Service: Endoscopy;  Laterality: N/A;   FEMUR FRACTURE SURGERY Right 03/2011; 09/03/2011   "had 2, 2 wk apart in 2012; broke it again 08/2011 & had OR"   [place] N/A 07/10/2021   Procedure: FLEXIBLE SIGMOIDOSCOPY;  Surgeon: Rush Landmark Telford Nab., MD;  Location: [facility];  Service: Gastroenterology;  Laterality: N/A;   FRACTURE SURGERY     GIVENS CAPSULE STUDY N/A 07/11/2021   Procedure: GIVENS CAPSULE STUDY;  Surgeon: Juanita Craver, MD;  Location: [facility] ENDOSCOPY;  Service: Endoscopy;  Laterality: N/A;   HEMOSTASIS CONTROL  03/20/2021   Procedure: HEMOSTASIS CONTROL;  Surgeon: Milus Banister, MD;  Location: [place];  Service: Endoscopy;;   HOT HEMOSTASIS N/A 03/20/2021   Procedure: HOT HEMOSTASIS (ARGON PLASMA COAGULATION/BICAP);  Surgeon: Milus Banister, MD;  Location[facility] ENDOSCOPY;  Service: Endoscopy;  Laterality: N/A;   HOT  HEMOSTASIS N/A 07/10/2021   Procedure: HOT HEMOSTASIS (ARGON PLASMA COAGULATION/BICAP);  Surgeon: Irving Copas., MD;  Location: [facility];  Service: Gastroenterology;  Laterality: N/A;   I & D EXTREMITY  09/15/2011   Procedure: IRRIGATION AND DEBRIDEMENT EXTREMITY w REMOVAL OF HARDWARE;  Surgeon: [name], MD;  Location: [facility];  Service: Orthopedics;  Laterality: Right;   INCISION AND DRAINAGE HIP  12/21/2011   Procedure: IRRIGATION AND DEBRIDEMENT HIP;  Surgeon: [name], MD;  Location: [facility];  Service: Orthopedics;  Laterality: Right;  I&D RIGHT HIP WITH PLACEMENT ANTIBIOTIC SPACER   INSERTION OF DIALYSIS CATHETER     Procedure: INSERTION OF DIALYSIS CATHETER;  Surgeon: Rosetta Posner, MD;  Location: [place];  Service: Vascular;  Laterality: Right;   IRRIGATION AND DEBRIDEMENT ABSCESS  12/21/11   right hip   JOINT REPLACEMENT     KIDNEY TRANSPLANT  07/19/15   LAPAROSCOPIC GASTRIC BANDING  2008   LEFT ATRIAL APPENDAGE OCCLUSION  10/04/2017   OPEN REDUCTION INTERNAL FIXATION (ORIF) DISTAL RADIAL FRACTURE Right 10/18/2016   Procedure: OPEN REDUCTION INTERNAL FIXATION (ORIF) RIGHT DISTAL RADIAL FRACTURE;  Surgeon: Marchia Bond, MD;  Location: [place];  Service: Orthopedics;  Laterality: Right;   SUBMUCOSAL TATTOO INJECTION  07/10/2021   Procedure: SUBMUCOSAL TATTOO INJECTION;  Surgeon: Irving Copas., MD;  Location: [facility];  Service: Gastroenterology;;   TOTAL HIP ARTHROPLASTY Right 02/2011   right THA 02/2011, I&D/removal of hardware 09/2011,, repeat I&D Jun 2013, reimplantation R THA 03-26-2012   TOTAL HIP REVISION  03/25/2012   Procedure: TOTAL HIP REVISION;  Surgeon: [name], MD;  Location: [facility];  Service: Orthopedics;  Laterality: Right;  Right Total Hip Reimplantation   TUBAL LIGATION     VENA CAVA FILTER PLACEMENT  11/2011   Family History  Problem Relation Age of Onset   Cancer Father    Diabetes Mother    Hypertension Mother     Arthritis Mother    Hodgkin's lymphoma Other 14       dscd---HODGKINS DISEASE   Hypertension Brother    Social History   Socioeconomic History   Marital status: Married    Spouse name: Not on file   Number of children: Not on file   Years of education: Not on file   Highest education level: Not on file  Occupational History   Occupation: retired  Tobacco Use   Smoking status: Never   Smokeless tobacco: Never  Vaping Use   Vaping Use: Never used  Substance and Sexual Activity   Alcohol use: Never   Drug use: Never   Sexual activity: Yes    Birth control/protection: Post-menopausal  Other Topics Concern   Not on file  Social History Narrative   Married; retired; lives in [place] Strain: Low Risk    Difficulty of Paying Living Expenses: Not hard at all  Food Insecurity: No Food Insecurity   Worried About Charity fundraiser in the Last Year: Never true   Arboriculturist in the Last Year: Never true  Transportation Needs: No Transportation Needs   Lack of Transportation (Medical): No   Lack of Transportation (Non-Medical): No  Physical Activity: Inactive   Days of Exercise per Week: 0 days   Minutes of Exercise per Session: 0 min  Stress: No Stress Concern Present   Feeling of Stress : Not at all  Social Connections: Socially Integrated   Frequency of Communication with Friends and Family: Twice a week   Frequency of Social Gatherings with Friends and Family: Twice a week   Attends Religious Services: More than 4 times per year   Active Member of [employer] or Organizations: Yes   Attends Archivist Meetings: 1 to 4 times per year   Marital  Status: Married    Tobacco Counseling Counseling given: Not Answered   Clinical Intake:  Pre-visit preparation completed: Yes  Pain : No/denies pain     Nutritional Risks: None Diabetes: No  How often do you need to have someone help you when you read  instructions, pamphlets, or other written materials from your doctor or pharmacy?: 1 - Never What is the last grade level you completed in school?: college  Diabetic?no   Interpreter Needed?: No  Information entered by :: [place] of Daily Living    12/05/2021   11:05 AM 12/05/2021   11:01 AM  In your present state of health, do you have any difficulty performing the following activities:  Hearing? 0 0  Vision? 0 0  Difficulty concentrating or making decisions? 0 0  Walking or climbing stairs? 0 0  Dressing or bathing? 0 0  Doing errands, shopping? 0 0  Preparing Food and eating ? N N  Using the Toilet? N N  In the past six months, have you accidently leaked urine? N N  Do you have problems with loss of bowel control? N N  Managing your Medications? N N  Managing your Finances? N N  Housekeeping or managing your Housekeeping? N N    Patient Care Team: [name], [name], NP as PCP - General (Internal Medicine) Bensimhon, Shaune Pascal, MD as PCP - Advanced Heart Failure (Cardiology) Sueanne Margarita, MD as Consulting Physician (Cardiology) Paralee Cancel, MD as Consulting Physician (Orthopedic Surgery) Fleet Contras, MD as Consulting Physician (Nephrology) Obgyn, Myles Lipps, Glean Salvo, [facility] as Pharmacist (Pharmacist) Dannielle Karvonen, RN as [place] any recent Medical Services you may have received from other than [facility] providers in the past year (date may be approximate).     Assessment:   This is a routine wellness examination for Delmy.  Hearing/Vision screen Vision Screening - Comments:: Annual eye exams wear glasses   Dietary issues and exercise activities discussed: Current Exercise Habits: The patient does not participate in regular exercise at present, Type of exercise: walking, Time (Minutes): 30, Frequency (Times/Week): 3, Weekly Exercise (Minutes/Week): 90, Intensity: Mild, Exercise limited by:  None identified   Goals Addressed   None    Depression Screen    12/05/2021   11:05 AM 12/05/2021   11:01 AM 12/05/2021   11:00 AM 11/14/2021    9:55 AM 10/24/2021   12:37 PM 10/10/2021   10:30 AM 08/11/2021    3:12 PM  PHQ 2/9 Scores  PHQ - 2 Score 0 0 0 '6 2 3 4  '$ PHQ- 9 Score    '17 13 12 16    '$ Fall Risk    12/05/2021   11:05 AM 12/05/2021   11:02 AM 10/24/2021   12:43 PM 08/11/2021    3:12 PM 01/10/2021   11:09 AM  Fall Risk   Falls in the past year? 0 0 0 0 0  Number falls in past yr: 0 0  0 0  Injury with Fall? 0 0  0 0  Follow up Falls evaluation completed Falls evaluation completed       FALL RISK PREVENTION PERTAINING TO THE HOME:  Any stairs in or around the home? Yes  If so, are there any without handrails? No  Home free of loose throw rugs in walkways, pet beds, electrical cords, etc? Yes  Adequate lighting in your home to reduce risk of falls? Yes   ASSISTIVE DEVICES UTILIZED TO PREVENT  FALLS:  Life alert? No  Use of a cane, walker or w/c? Yes  Grab bars in the bathroom? Yes  Shower chair or bench in shower? Yes  Elevated toilet seat or a handicapped toilet? Yes    Cognitive Function:Normal cognitive status assessed by telephone conversation  by this Nurse Health Advisor. No abnormalities found.      09/18/2016    8:07 AM  MMSE - Mini Mental State Exam  Orientation to time 5  Orientation to Place 5  Registration 3  Attention/ Calculation 5  Recall 3  Language- name 2 objects 2  Language- repeat 1  Language- follow 3 step command 3  Language- read & follow direction 1  Write a sentence 1  Copy design 0  Total score 29        Immunizations Immunization History  Administered Date(s) Administered   Fluad Quad(high Dose 65+) 04/28/2019, 05/02/2020   Influenza Split 03/27/2012   Influenza Whole 04/29/2008   Influenza, High Dose Seasonal PF 04/19/2017, 03/16/2018   Influenza,inj,Quad PF,6+ Mos 04/24/2013   Influenza-Unspecified 04/29/2014,  03/17/2015, 03/29/2021   PFIZER(Purple Top)SARS-COV-2 Vaccination 09/09/2019, 09/23/2019, 06/24/2020   Pneumococcal Conjugate-13 03/02/2014   Pneumococcal Polysaccharide-23 04/25/2005, 09/18/2016   Tdap 05/08/2013    TDAP status: Up to date  Flu Vaccine status: Up to date  Pneumococcal vaccine status: Up to date  Covid-19 vaccine status: Completed vaccines  Qualifies for Shingles Vaccine? Yes   Zostavax completed No   Shingrix Completed?: No.    Education has been provided regarding the importance of this vaccine. Patient has been advised to call insurance company to determine out of pocket expense if they have not yet received this vaccine. Advised may also receive vaccine at local pharmacy or Health Dept. Verbalized acceptance and understanding.  Screening Tests Health Maintenance  Topic Date Due   Zoster Vaccines- Shingrix (1 of 2) Never done   COVID-19 Vaccine (4 - Booster) 08/19/2020   INFLUENZA VACCINE  02/13/2022   MAMMOGRAM  03/08/2022   TETANUS/TDAP  05/09/2023   COLONOSCOPY (Pts 45-12yr Insurance coverage will need to be confirmed)  11/15/2029   Pneumonia Vaccine 76 Years old  Completed   DEXA SCAN  Completed   Hepatitis C Screening  Completed   HPV VACCINES  Aged Out    Health Maintenance  Health Maintenance Due  Topic Date Due   Zoster Vaccines- Shingrix (1 of 2) Never done   COVID-19 Vaccine (4 - Booster) 08/19/2020    Colorectal cancer screening: Type of screening: Colonoscopy. Completed 11/16/2019. Repeat every 10 years  Mammogram status: Ordered 05/03/2021. Pt provided with contact info and advised to call to schedule appt.   Bone Density status: Ordered declined . Pt provided with contact info and advised to call to schedule appt.  Lung Cancer Screening: (Low Dose CT Chest recommended if Age 74-80years, 30 pack-year currently smoking OR have quit w/in 15years.) does not qualify.   Lung Cancer Screening Referral: n/a  Additional  Screening:  Hepatitis C Screening: does not qualify;  Vision Screening: Recommended annual ophthalmology exams for early detection of glaucoma and other disorders of the eye. Is the patient up to date with their annual eye exam?  Yes  Who is the provider or what is the name of the office in which the patient attends annual eye exams[facility] is not established with a provider, would they like to be referred to a provider to establish care? No .   Dental Screening: Recommended annual  dental exams for proper oral hygiene  Community Resource Referral / Chronic Care Management: CRR required this visit?  No   CCM required this visit?  No      Plan:     I have personally reviewed and noted the following in the patient's chart:   Medical and social history Use of alcohol, tobacco or illicit drugs  Current medications and supplements including opioid prescriptions.  Functional ability and status Nutritional status Physical activity Advanced directives List of other physicians Hospitalizations, surgeries, and ER visits in previous 12 months Vitals Screenings to include cognitive, depression, and falls Referrals and appointments  In addition, I have reviewed and discussed with patient certain preventive protocols, quality metrics, and best practice recommendations. A written personalized care plan for preventive services as well as general preventive health recommendations were provided to patient.     Randel Pigg, LPN   07/30/7260   Nurse Notes: none

## 2021-12-05 NOTE — Telephone Encounter (Signed)
[name] called to check status of extending PT. Please call at [phone]

## 2021-12-05 NOTE — Patient Instructions (Signed)
[name] , Thank you for taking time to come for your Medicare Wellness Visit. I appreciate your ongoing commitment to your health goals. Please review the following plan we discussed and let me know if I can assist you in the future.   Screening recommendations/referrals: Colonoscopy: 11/16/2019 Mammogram: ordered 05/03/2021 Bone Density: declined  Recommended yearly ophthalmology/optometry visit for glaucoma screening and checkup Recommended yearly dental visit for hygiene and checkup  Vaccinations: Influenza vaccine: completed  Pneumococcal vaccine: completed  Tdap vaccine: 05/08/2013 Shingles vaccine: will consider     Advanced directives: none   Conditions/risks identified: none   Next appointment: none    Preventive Care 35 Years and Older, Female Preventive care refers to lifestyle choices and visits with your health care provider that can promote health and wellness. What does preventive care include? A yearly physical exam. This is also called an annual well check. Dental exams once or twice a year. Routine eye exams. Ask your health care provider how often you should have your eyes checked. Personal lifestyle choices, including: Daily care of your teeth and gums. Regular physical activity. Eating a healthy diet. Avoiding tobacco and drug use. Limiting alcohol use. Practicing safe sex. Taking low-dose aspirin every day. Taking vitamin and mineral supplements as recommended by your health care provider. What happens during an annual well check? The services and screenings done by your health care provider during your annual well check will depend on your age, overall health, lifestyle risk factors, and family history of disease. Counseling  Your health care provider may ask you questions about your: Alcohol use. Tobacco use. Drug use. Emotional well-being. Home and relationship well-being. Sexual activity. Eating habits. History of falls. Memory and ability  to understand (cognition). Work and work Statistician. Reproductive health. Screening  You may have the following tests or measurements: Height, weight, and BMI. Blood pressure. Lipid and cholesterol levels. These may be checked every 5 years, or more frequently if you are over 14 years old. Skin check. Lung cancer screening. You may have this screening every year starting at age 50 if you have a 30-pack-year history of smoking and currently smoke or have quit within the past 15 years. Fecal occult blood test (FOBT) of the stool. You may have this test every year starting at age 65. Flexible sigmoidoscopy or colonoscopy. You may have a sigmoidoscopy every 5 years or a colonoscopy every 10 years starting at age 44. Hepatitis C blood test. Hepatitis B blood test. Sexually transmitted disease (STD) testing. Diabetes screening. This is done by checking your blood sugar (glucose) after you have not eaten for a while (fasting). You may have this done every 1-3 years. Bone density scan. This is done to screen for osteoporosis. You may have this done starting at age 17. Mammogram. This may be done every 1-2 years. Talk to your health care provider about how often you should have regular mammograms. Talk with your health care provider about your test results, treatment options, and if necessary, the need for more tests. Vaccines  Your health care provider may recommend certain vaccines, such as: Influenza vaccine. This is recommended every year. Tetanus, diphtheria, and acellular pertussis (Tdap, Td) vaccine. You may need a Td booster every 10 years. Zoster vaccine. You may need this after age 76. Pneumococcal 13-valent conjugate (PCV13) vaccine. One dose is recommended after age 65. Pneumococcal polysaccharide (PPSV23) vaccine. One dose is recommended after age 69. Talk to your health care provider about which screenings and vaccines you need and  how often you need them. This information is not  intended to replace advice given to you by your health care provider. Make sure you discuss any questions you have with your health care provider. Document Released: 07/29/2015 Document Revised: 03/21/2016 Document Reviewed: 05/03/2015 Elsevier Interactive Patient Education  2017 [place] Prevention in the Home Falls can cause injuries. They can happen to people of all ages. There are many things you can do to make your home safe and to help prevent falls. What can I do on the outside of my home? Regularly fix the edges of walkways and driveways and fix any cracks. Remove anything that might make you trip as you walk through a door, such as a raised step or threshold. Trim any bushes or trees on the path to your home. Use bright outdoor lighting. Clear any walking paths of anything that might make someone trip, such as rocks or tools. Regularly check to see if handrails are loose or broken. Make sure that both sides of any steps have handrails. Any raised decks and porches should have guardrails on the edges. Have any leaves, snow, or ice cleared regularly. Use sand or salt on walking paths during winter. Clean up any spills in your garage right away. This includes oil or grease spills. What can I do in the bathroom? Use night lights. Install grab bars by the toilet and in the tub and shower. Do not use towel bars as grab bars. Use non-skid mats or decals in the tub or shower. If you need to sit down in the shower, use a plastic, non-slip stool. Keep the floor dry. Clean up any water that spills on the floor as soon as it happens. Remove soap buildup in the tub or shower regularly. Attach bath mats securely with double-sided non-slip rug tape. Do not have throw rugs and other things on the floor that can make you trip. What can I do in the bedroom? Use night lights. Make sure that you have a light by your bed that is easy to reach. Do not use any sheets or blankets that are  too big for your bed. They should not hang down onto the floor. Have a firm chair that has side arms. You can use this for support while you get dressed. Do not have throw rugs and other things on the floor that can make you trip. What can I do in the kitchen? Clean up any spills right away. Avoid walking on wet floors. Keep items that you use a lot in easy-to-reach places. If you need to reach something above you, use a strong step stool that has a grab bar. Keep electrical cords out of the way. Do not use floor polish or wax that makes floors slippery. If you must use wax, use non-skid floor wax. Do not have throw rugs and other things on the floor that can make you trip. What can I do with my stairs? Do not leave any items on the stairs. Make sure that there are handrails on both sides of the stairs and use them. Fix handrails that are broken or loose. Make sure that handrails are as long as the stairways. Check any carpeting to make sure that it is firmly attached to the stairs. Fix any carpet that is loose or worn. Avoid having throw rugs at the top or bottom of the stairs. If you do have throw rugs, attach them to the floor with carpet tape. Make sure that you have  a light switch at the top of the stairs and the bottom of the stairs. If you do not have them, ask someone to add them for you. What else can I do to help prevent falls? Wear shoes that: Do not have high heels. Have rubber bottoms. Are comfortable and fit you well. Are closed at the toe. Do not wear sandals. If you use a stepladder: Make sure that it is fully opened. Do not climb a closed stepladder. Make sure that both sides of the stepladder are locked into place. Ask someone to hold it for you, if possible. Clearly mark and make sure that you can see: Any grab bars or handrails. First and last steps. Where the edge of each step is. Use tools that help you move around (mobility aids) if they are needed. These  include: Canes. Walkers. Scooters. Crutches. Turn on the lights when you go into a dark area. Replace any light bulbs as soon as they burn out. Set up your furniture so you have a clear path. Avoid moving your furniture around. If any of your floors are uneven, fix them. If there are any pets around you, be aware of where they are. Review your medicines with your doctor. Some medicines can make you feel dizzy. This can increase your chance of falling. Ask your doctor what other things that you can do to help prevent falls. This information is not intended to replace advice given to you by your health care provider. Make sure you discuss any questions you have with your health care provider. Document Released: 04/28/2009 Document Revised: 12/08/2015 Document Reviewed: 08/06/2014 Elsevier Interactive Patient Education  2017 [employer].

## 2021-12-05 NOTE — Telephone Encounter (Signed)
Claiborne Billings called back for the verbal orders. She says it's OK to leave them on her vm at [phone].

## 2021-12-06 ENCOUNTER — Telehealth: Payer: Self-pay | Admitting: Nurse Practitioner

## 2021-12-06 NOTE — Telephone Encounter (Signed)
Claiborne Billings from [facility] [phone] needs verbal orders for this pt. PT 1xw= 4w. She has called several times. She said you can leave the order on this # it is secure.

## 2021-12-06 NOTE — Telephone Encounter (Signed)
Left Claiborne Billings a Verbal order on this number ([phone]) at 1:34pm extending PT.

## 2021-12-06 NOTE — Telephone Encounter (Signed)
Left Claiborne Billings a VM w/ Verbal order for extending PT

## 2021-12-07 ENCOUNTER — Telehealth: Payer: Self-pay | Admitting: Nurse Practitioner

## 2021-12-07 NOTE — Telephone Encounter (Signed)
[name] called for Pt to ask for a referral to [facility] and [facility]. They would like for her to have more PT He has requested ASAP. His # is [phone]

## 2021-12-08 ENCOUNTER — Other Ambulatory Visit: Payer: Self-pay

## 2021-12-08 ENCOUNTER — Ambulatory Visit (HOSPITAL_COMMUNITY)
Admission: RE | Admit: 2021-12-08 | Discharge: 2021-12-08 | Disposition: A | Discharge status: Home / Self Care | Payer: Medicare Other | Source: Ambulatory Visit | Attending: Nephrology | Admitting: Nephrology

## 2021-12-08 VITALS — BP 150/74 | HR 70 | Temp 98.0°F | Resp 12

## 2021-12-08 DIAGNOSIS — N185 Chronic kidney disease, stage 5: Secondary | ICD-10-CM | POA: Diagnosis not present

## 2021-12-08 DIAGNOSIS — G8929 Other chronic pain: Secondary | ICD-10-CM

## 2021-12-08 MED ORDER — EPOETIN ALFA-EPBX 10000 UNIT/ML IJ SOLN
20000.0000 [IU] | INTRAMUSCULAR | Status: DC
  Administered 2021-12-08: 20000 [IU] via SUBCUTANEOUS

## 2021-12-08 MED ORDER — EPOETIN ALFA-EPBX 40000 UNIT/ML IJ SOLN
INTRAMUSCULAR | Status: AC
  Filled 2021-12-08: qty 1

## 2021-12-08 NOTE — Telephone Encounter (Signed)
Called & spoke w/ pt's Husband, he says that [name] was seen there before for her back pain. They gave her 2 injections over the past year, the second injection wore off after 6 months & now her back pain has returned. They need a referral to [facility] in order to be seen again in the office.

## 2021-12-08 NOTE — Telephone Encounter (Signed)
See below

## 2021-12-08 NOTE — Telephone Encounter (Signed)
Referral sent 

## 2021-12-12 ENCOUNTER — Telehealth: Payer: Self-pay

## 2021-12-12 LAB — POCT HEMOGLOBIN-HEMACUE: Hemoglobin: 9.8 g/dL — ABNORMAL LOW (ref 12.0–15.0)

## 2021-12-12 NOTE — Progress Notes (Signed)
Per Clinical pharmacist, she was possibly interested in [facility]. I think she may be disadvantaged though. Can you do a price comparison for her? I don't have her part D card in the system. I believe she has a wellcare plan but you may need to call her pharmacy to determine which plan she has.     Price Comparison sent to clinical pharmacist for review.  [place] Pharmacist Assistant [phone]

## 2021-12-12 NOTE — Patient Instructions (Signed)
Visit Information It was great speaking with you today!  Please let me know if you have any questions about our visit.   Goals Addressed             This Visit's Progress    Track and Manage Fluids and Swelling-Heart Failure       Timeframe:  Long-Range Goal Priority:  High Start Date: 12/12/21                            Expected End Date: 12/13/22                      Follow Up within 30 days   - call office if I gain more than 2 pounds in one day or 5 pounds in one week - use salt in moderation - watch for swelling in feet, ankles and legs every day - weigh myself daily    Why is this important?   It is important to check your weight daily and watch how much salt and liquids you have.  It will help you to manage your heart failure.    Notes:         Patient Care Plan: RN RN Care Manager plan of care     Problem Identified: Knowledge deficit related to Heart Failure and care coordination needs   Priority: High     Long-Range Goal: Development plan of care for management of Heart Failure Completed 11/28/2021  Start Date: 10/24/2021  Expected End Date: 05/15/2022  Priority: High  Note:   Current Barriers:  Knowledge Deficits related to plan of care for management of CHF   RNCM Clinical Goal(s):  Patient will verbalize basic understanding of  CHF disease process and self health management plan as evidenced by self report and chart review take all medications exactly as prescribed and will call provider for medication related questions as evidenced by self report and chart review  through collaboration with RN Care manager, provider, and care team.   Interventions: Inter-disciplinary care team collaboration (see longitudinal plan of care) Evaluation of current treatment plan related to  self management and patient's adherence to plan as established by provider   Heart Failure Interventions:  (Status:  New goal.) Long Term Goal Basic overview and discussion of  pathophysiology of Heart Failure reviewed Provided education on low sodium diet Reviewed Heart Failure Action Plan in depth and provided written copy Assessed need for readable accurate scales in home Provided education about placing scale on hard, flat surface Advised patient to weigh each morning after emptying bladder Discussed importance of daily weight and advised patient to weigh and record daily Reviewed role of diuretics in prevention of fluid overload and management of heart failure; Discussed the importance of keeping all appointments with provider Provided patient with education about the role of exercise in the management of heart failure Advised patient to discuss ongoing limitation with exercises for Silver Sneakers at the gym with provider Screening for signs and symptoms of depression related to chronic disease state   Patient Goals/Self-Care Activities: Take all medications as prescribed Attend all scheduled provider appointments Call pharmacy for medication refills 3-7 days in advance of running out of medications Attend church or other social activities Perform all self care activities independently  Perform IADL's (shopping, preparing meals, housekeeping, managing finances) independently Call provider office for new concerns or questions  call office if I gain more than 2 pounds in one  day or 5 pounds in one week do ankle pumps when sitting keep legs up while sitting track weight in diary use salt in moderation watch for swelling in feet, ankles and legs every day weigh myself daily begin a heart failure diary bring diary to all appointments develop a rescue plan follow rescue plan if symptoms flare-up eat more whole grains, fruits and vegetables, lean meats and healthy fats know when to call the doctor:3 lbs overnight and 5 lbs within one week track symptoms and what helps feel better or worse dress right for the weather, hot or cold  Follow Up Plan:   Telephone follow up appointment with care management team member scheduled for:  May 2023 The patient has been provided with contact information for the care management team and has been advised to call with any health related questions or concerns.    12/05/2021 Pt has an [facility] to address her ongoing needs. Case will be closed via [place].    Patient Care Plan: General Pharmacy (Adult)     Problem Identified: Hypertension, Hyperlipidemia, Atrial Fibrillation, Heart Failure, Asthma, Depression, and Renal Transplant   Priority: High     Long-Range Goal: Patient-Specific Goal   Start Date: 12/12/2021  Expected End Date: 12/13/2022  This Visit's Progress: On track  Priority: High  Note:   Current Barriers:  Unable to independently afford treatment regimen  Pharmacist Clinical Goal(s):  Patient will verbalize ability to afford treatment regimen through collaboration with PharmD and provider.   Interventions: 1:1 collaboration with [name], [name], NP regarding development and update of comprehensive plan of care as evidenced by provider attestation and co-signature Inter-disciplinary care team collaboration (see longitudinal plan of care) Comprehensive medication review performed; medication list updated in electronic medical record  Hypertension (BP goal <140/90) -Controlled -Current treatment: Carvedilol 3.125 mg twice daily  Furosemide 80 mg three times weekly -Medications previously tried: NA  -Current home readings: NA -Denies hypotensive/hypertensive symptoms -Recommended to continue current medication  Hyperlipidemia: (LDL goal < 100) -Controlled -Current treatment: Atorvastatin 20 mg daily -Medications previously tried: NA  -Recommended to continue current medication  Atrial Fibrillation (Goal: prevent stroke and major bleeding) -Controlled -CHADSVASC: 6 -Current treatment: Rate control: Carvedilol 3.125 mg twice daily  Anticoagulation:  Eliquis 2.5 mg twice daily  -Medications previously tried: NA -PAP for Eliquis in progress. -Recommended to continue current medication  COPD (Goal: control symptoms and prevent exacerbations) -Controlled -Current treatment  Symbicort 160-4.5 mcg/act 2 puffs twice daily as needed  -Medications previously tried: NA  -Exacerbations requiring treatment in last 6 months: None -Patient reports consistent use of maintenance inhaler -Recommended to continue current medication  Depression/Anxiety (Goal: Achieve symptom remission) -Controlled -Current treatment: Fluoxetine 20 mg daily  -Medications previously tried/failed: NA -Recommended to continue current medication  History of Renal Transplant (Goal: Preserve kidney function) -Controlled -Current treatment  Mycophenolate EC 180 mg twice daily  Prednisone 5 mg daily  Tacrolimus 1 mg twice daily  -Medications previously tried: NA  -Recommended to continue current medication  Patient Goals/Self-Care Activities Patient will:  - check blood pressure weekly, document, and provide at future appointments  Follow Up Plan: Telephone follow up appointment with care management team member scheduled for:  12/14/2021 at 9:15 AM      Patient agreed to services and verbal consent obtained.   Patient verbalizes understanding of instructions and care plan provided today and agrees to view in [place]. Active MyChart status and patient understanding of how to access instructions and  care plan via MyChart confirmed with patient.     Junius Argyle, PharmD, [place], CPP Clinical Pharmacist Practitioner  [facility]  [phone]

## 2021-12-13 ENCOUNTER — Ambulatory Visit: Payer: Medicare Other

## 2021-12-13 ENCOUNTER — Telehealth: Payer: Self-pay

## 2021-12-13 DIAGNOSIS — K922 Gastrointestinal hemorrhage, unspecified: Secondary | ICD-10-CM | POA: Diagnosis not present

## 2021-12-13 DIAGNOSIS — N2581 Secondary hyperparathyroidism of renal origin: Secondary | ICD-10-CM | POA: Diagnosis not present

## 2021-12-13 DIAGNOSIS — J449 Chronic obstructive pulmonary disease, unspecified: Secondary | ICD-10-CM | POA: Diagnosis not present

## 2021-12-13 DIAGNOSIS — Z940 Kidney transplant status: Secondary | ICD-10-CM

## 2021-12-13 DIAGNOSIS — M1030 Gout due to renal impairment, unspecified site: Secondary | ICD-10-CM | POA: Diagnosis not present

## 2021-12-13 DIAGNOSIS — E876 Hypokalemia: Secondary | ICD-10-CM | POA: Diagnosis not present

## 2021-12-13 DIAGNOSIS — I509 Heart failure, unspecified: Secondary | ICD-10-CM

## 2021-12-13 DIAGNOSIS — N185 Chronic kidney disease, stage 5: Secondary | ICD-10-CM | POA: Diagnosis not present

## 2021-12-13 DIAGNOSIS — I480 Paroxysmal atrial fibrillation: Secondary | ICD-10-CM

## 2021-12-13 DIAGNOSIS — I4891 Unspecified atrial fibrillation: Secondary | ICD-10-CM | POA: Diagnosis not present

## 2021-12-13 DIAGNOSIS — D849 Immunodeficiency, unspecified: Secondary | ICD-10-CM | POA: Diagnosis not present

## 2021-12-13 DIAGNOSIS — I110 Hypertensive heart disease with heart failure: Secondary | ICD-10-CM

## 2021-12-13 DIAGNOSIS — R894 Abnormal immunological findings in specimens from other organs, systems and tissues: Secondary | ICD-10-CM | POA: Diagnosis not present

## 2021-12-13 DIAGNOSIS — E785 Hyperlipidemia, unspecified: Secondary | ICD-10-CM

## 2021-12-13 DIAGNOSIS — R809 Proteinuria, unspecified: Secondary | ICD-10-CM | POA: Diagnosis not present

## 2021-12-13 DIAGNOSIS — I639 Cerebral infarction, unspecified: Secondary | ICD-10-CM

## 2021-12-13 DIAGNOSIS — I6310 Cerebral infarction due to embolism of unspecified precerebral artery: Secondary | ICD-10-CM

## 2021-12-13 DIAGNOSIS — F32A Depression, unspecified: Secondary | ICD-10-CM

## 2021-12-13 DIAGNOSIS — E872 Acidosis, unspecified: Secondary | ICD-10-CM | POA: Diagnosis not present

## 2021-12-13 DIAGNOSIS — D631 Anemia in chronic kidney disease: Secondary | ICD-10-CM | POA: Diagnosis not present

## 2021-12-13 DIAGNOSIS — I5022 Chronic systolic (congestive) heart failure: Secondary | ICD-10-CM

## 2021-12-13 DIAGNOSIS — I132 Hypertensive heart and chronic kidney disease with heart failure and with stage 5 chronic kidney disease, or end stage renal disease: Secondary | ICD-10-CM | POA: Diagnosis not present

## 2021-12-13 DIAGNOSIS — I5042 Chronic combined systolic (congestive) and diastolic (congestive) heart failure: Secondary | ICD-10-CM | POA: Diagnosis not present

## 2021-12-13 DIAGNOSIS — I120 Hypertensive chronic kidney disease with stage 5 chronic kidney disease or end stage renal disease: Secondary | ICD-10-CM | POA: Diagnosis not present

## 2021-12-13 NOTE — Chronic Care Management (AMB) (Signed)
Chronic Care Management   CCM RN Visit Note  12/13/2021 Name: [name] MRN: [id number] DOB: 1945/08/14  Subjective: [name] is a 76 y.o. year old female who is a primary care patient of [name], [name], NP. The care management team was consulted for assistance with disease management and care coordination needs.    Engaged with patient by telephone for initial visit in response to provider referral for case management and/or care coordination services.   Consent to Services:  The patient was given information about Chronic Care Management services, agreed to services, and gave verbal consent prior to initiation of services.  Please see initial visit note for detailed documentation.   Patient agreed to services and verbal consent obtained.   Assessment: Review of patient past medical history, allergies, medications, health status, including review of consultants reports, laboratory and other test data, was performed as part of comprehensive evaluation and provision of chronic care management services.   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     albuterol (PROVENTIL) (2.5  MG/3ML) 0.083% nebulizer solution Take 3 mLs (2.5 mg total) by nebulization every 6 (six) hours as needed for wheezing or shortness of breath. Dx:J45.909    albuterol (VENTOLIN HFA) 108 (90 Base) MCG/ACT inhaler INHALE 1-2 PUFFS BY MOUTH EVERY 6 HOURS AS NEEDED FOR WHEEZE OR SHORTNESS OF BREATH    apixaban (ELIQUIS) 2.5 MG TABS tablet Take 1 tablet (2.5 mg total) by mouth 2 (two) times daily.    atorvastatin (LIPITOR) 20 MG tablet Take 1 tablet (20 mg total) by mouth daily.    budesonide-formoterol (SYMBICORT) 160-4.5 MCG/ACT inhaler Inhale 2 puffs into the lungs 2 (two) times daily. 12/13/2021: Patient states she takes only as needed.    carvedilol (COREG) 3.125 MG tablet Take 1 tablet (3.125 mg total) by mouth 2 (two) times daily with a meal.    docusate sodium (COLACE) 100 MG capsule Take 100 mg by mouth daily.    FLUoxetine (PROZAC) 20 MG capsule Take 1 capsule (20 mg total) by mouth daily.    furosemide (LASIX) 80 MG tablet Take 80 mg by mouth 3 (three) times a week.    lactulose (CHRONULAC) 10 GM/15ML solution Take 30 mLs (20 g total) by mouth daily as needed for mild constipation or severe constipation.    mycophenolate (MYFORTIC) 180 MG EC tablet Take 180 mg by mouth 2 (two) times daily.    ondansetron (ZOFRAN) 4 MG tablet Take 1 tablet (4 mg total) by mouth every 8 (eight) hours as needed for up to 9 doses for nausea.    pantoprazole (PROTONIX) 20 MG tablet Take 1 tablet (20 mg total)  by mouth daily.    potassium chloride (MICRO-K) 10 MEQ CR capsule Take 10 mEq by mouth 3 (three) times a week.    predniSONE (DELTASONE) 5 MG tablet Take 5 mg by mouth daily.    tacrolimus (PROGRAF) 1 MG capsule Take 1 mg by mouth 2 (two) times daily.    No facility-administered encounter medications on file as of 12/13/2021.      Conditions to be addressed/monitored:Atrial Fibrillation and CHF  Care Plan : [facility]  plan of care  Updates made by Dannielle Karvonen, RN since 12/13/2021 12:00 AM     Problem:  Chronic disease management education and care coordination needs.   Priority: High     Long-Range Goal: Develpment of plan of care to address chronic disease management and care coordination needs   Start Date: 12/13/2021  Expected End Date: 12/13/2021  Priority: High  Note:   Current Barriers:  Knowledge Deficits related to plan of care for management of Atrial Fibrillation and CHF  Chronic Disease Management support and education needs related to Atrial Fibrillation and CHF Telephone call with patient and spouse (caregiver), Harrold Donath.  Patient gave verbal authorization to speak with spouse regarding her personal health information.  Status post stroke January 2023. Patient states she currently has home health services for speech and physical therapy.  Patient reports attending the heart failure clinic. Reports next follow up visit with cardiologist is August 2023.  Patient reports she is not wearing her CPAP consistently. She states she forgets to put it on.    RNCM Clinical Goal(s):  Patient will verbalize understanding of plan for management of Atrial Fibrillation and CHF as evidenced by patient self report and/ or notation in chart.  take all medications exactly as prescribed and will call provider for medication related questions as evidenced by patient self report and/ or notation in chart.     attend all scheduled medical appointments:   as evidenced by patient self report and/ or notation in chart.         continue to work with Consulting civil engineer and/or Social Worker to address care management and care coordination needs related to Atrial Fibrillation and CHF as evidenced by adherence to CM Team Scheduled appointments     through collaboration with RN Care manager, provider, and care team.   Interventions: 1:1 collaboration with primary care provider regarding development and update of comprehensive plan of care as evidenced by provider attestation and co-signature Inter-disciplinary  care team collaboration (see longitudinal plan of care) Evaluation of current treatment plan related to  self management and patient's adherence to plan as established by provider   AFIB Interventions: (Status:  New goal.) Long Term Goal   Counseled on increased risk of stroke due to Afib and benefits of anticoagulation for stroke prevention Reviewed importance of adherence to anticoagulant exactly as prescribed Reviewed medications with patient and discussed importance of medication adherence Discussed plans with patient for ongoing care management follow up and provided patient with direct contact information for care management team Reviewed scheduled/upcoming provider appointments   Heart Failure Interventions:  (Status:  New goal.) Long Term Goal Reviewed Heart Failure Action Plan in depth and provided written copy Assessed need for readable accurate scales in home Discussed importance of daily weight and advised patient to weigh and record daily Reviewed role of diuretics in prevention of fluid overload and management of heart failure; Discussed the importance of keeping all appointments with provider  Reviewed medications with patient and discussed importance  of medication adherence Discussed plans with patient for ongoing care management follow up and provided patient with direct contact information for care management team Reviewed scheduled/upcoming provider appointments    Stroke:  ( Status: New goal.)  Long term Goal Reviewed medications with patient and discussed importance of medication adherence Discussed plans with patient for ongoing care management follow up and provided patient with direct contact information for care management team Reviewed scheduled/upcoming provider appointments  Discussed fall prevention Discussed signs/ symptoms of stroke.  Advised to call 911 for symptoms Discussed with patient importance of wearing CPAP equipment. Advised to wear nightly.        Patient Goals/Self-Care Activities: Take medications as prescribed   Attend all scheduled provider appointments Call pharmacy for medication refills 3-7 days in advance of running out of medications Call provider office for new concerns or questions  call office if I gain more than 3 pounds in one day or 5 pounds in one week - check pulse (heart) rate once a day Weigh daily and record weights Review fall prevention, heart failure and atrial fibrillation education article sent to you in [place]. Wear CPAP nightly        Plan:The patient has been provided with contact information for the care management team and has been advised to call with any health related questions or concerns.  The care management team will reach out to the patient again over the next 45 days.  Quinn Plowman RN,BSN,CCM RN Case Manager [place] [phone]

## 2021-12-13 NOTE — Patient Instructions (Signed)
Visit Information   Thank you for taking time to visit with me today. Please don't hesitate to contact me if I can be of assistance to you before our next scheduled telephone appointment.  Following are the goals we discussed today:  Take medications as prescribed   Attend all scheduled provider appointments Call pharmacy for medication refills 3-7 days in advance of running out of medications Call provider office for new concerns or questions  call office if I gain more than 3 pounds in one day or 5 pounds in one week - check pulse (heart) rate once a day Weigh daily and record weights Review fall prevention, heart failure and atrial fibrillation education article sent to you in [place]. Wear CPAP nightly   Our next appointment is by telephone on 01/10/22 at 10:00 am  Please call the care guide team at [phone] if you need to cancel or reschedule your appointment.   If you are experiencing a Mental Health or [place] or need someone to talk to, please call the Suicide and Crisis Lifeline: 988 call 1-800-273-TALK (toll free, 24 hour hotline)   Following is a copy of your full care plan:  Care Plan : [facility]  plan of care  Updates made by Dannielle Karvonen, RN since 12/13/2021 12:00 AM     Problem: Chronic disease management education and care coordination needs.   Priority: High     Long-Range Goal: Develpment of plan of care to address chronic disease management and care coordination needs   Start Date: 12/13/2021  Expected End Date: 12/13/2021  Priority: High  Note:   Current Barriers:  Knowledge Deficits related to plan of care for management of Atrial Fibrillation and CHF  Chronic Disease Management support and education needs related to Atrial Fibrillation and CHF Telephone call with patient and spouse (caregiver), Harrold Donath.  Patient gave verbal authorization to speak with spouse regarding her personal health information.  Status post stroke January 2023.  Patient states she currently has home health services for speech and physical therapy.  Patient reports attending the heart failure clinic. Reports next follow up visit with cardiologist is August 2023.  Patient reports she is not wearing her CPAP consistently. She states she forgets to put it on.    RNCM Clinical Goal(s):  Patient will verbalize understanding of plan for management of Atrial Fibrillation and CHF as evidenced by patient self report and/ or notation in chart.  take all medications exactly as prescribed and will call provider for medication related questions as evidenced by patient self report and/ or notation in chart.     attend all scheduled medical appointments:   as evidenced by patient self report and/ or notation in chart.         continue to work with Consulting civil engineer and/or Social Worker to address care management and care coordination needs related to Atrial Fibrillation and CHF as evidenced by adherence to CM Team Scheduled appointments     through collaboration with RN Care manager, provider, and care team.   Interventions: 1:1 collaboration with primary care provider regarding development and update of comprehensive plan of care as evidenced by provider attestation and co-signature Inter-disciplinary care team collaboration (see longitudinal plan of care) Evaluation of current treatment plan related to  self management and patient's adherence to plan as established by provider   AFIB Interventions: (Status:  New goal.) Long Term Goal   Counseled on increased risk of stroke due to Afib and benefits of anticoagulation  for stroke prevention Reviewed importance of adherence to anticoagulant exactly as prescribed Reviewed medications with patient and discussed importance of medication adherence Discussed plans with patient for ongoing care management follow up and provided patient with direct contact information for care management team Reviewed scheduled/upcoming provider  appointments   Heart Failure Interventions:  (Status:  New goal.) Long Term Goal Reviewed Heart Failure Action Plan in depth and provided written copy Assessed need for readable accurate scales in home Discussed importance of daily weight and advised patient to weigh and record daily Reviewed role of diuretics in prevention of fluid overload and management of heart failure; Discussed the importance of keeping all appointments with provider  Reviewed medications with patient and discussed importance of medication adherence Discussed plans with patient for ongoing care management follow up and provided patient with direct contact information for care management team Reviewed scheduled/upcoming provider appointments    Stroke:  ( Status: New goal.)  Long term Goal Reviewed medications with patient and discussed importance of medication adherence Discussed plans with patient for ongoing care management follow up and provided patient with direct contact information for care management team Reviewed scheduled/upcoming provider appointments  Discussed fall prevention Discussed signs/ symptoms of stroke.  Advised to call 911 for symptoms Discussed with patient importance of wearing CPAP equipment. Advised to wear nightly.       Patient Goals/Self-Care Activities: Take medications as prescribed   Attend all scheduled provider appointments Call pharmacy for medication refills 3-7 days in advance of running out of medications Call provider office for new concerns or questions  call office if I gain more than 3 pounds in one day or 5 pounds in one week - check pulse (heart) rate once a day Weigh daily and record weights Review fall prevention, heart failure and atrial fibrillation education article sent to you in [place]. Wear CPAP nightly        Consent to CCM Services: Ms. Mozingo was given information about Chronic Care Management services including:  CCM service includes personalized  support from designated clinical staff supervised by her physician, including individualized plan of care and coordination with other care providers 24/7 contact phone numbers for assistance for urgent and routine care needs. Service will only be billed when office clinical staff spend 20 minutes or more in a month to coordinate care. Only one practitioner may furnish and bill the service in a calendar month. The patient may stop CCM services at any time (effective at the end of the month) by phone call to the office staff. The patient will be responsible for cost sharing (co-pay) of up to 20% of the service fee (after annual deductible is met).  Patient agreed to services and verbal consent obtained.   Patient verbalizes understanding of instructions and care plan provided today and agrees to view in [place]. Active MyChart status and patient understanding of how to access instructions and care plan via MyChart confirmed with patient.     The patient has been provided with contact information for the care management team and has been advised to call with any health related questions or concerns.  The care management team will reach out to the patient again over the next 45 days.   Quinn Plowman RN,BSN,CCM RN Case Manager [place] [phone]

## 2021-12-13 NOTE — Progress Notes (Signed)
Chronic Care Management APPOINTMENT REMINDER  [name] was reminded to have all medications, supplements and any blood glucose and blood pressure readings available for review with Junius Argyle, Pharm. D, at her telephone visit on 12/14/2021 at 9:15 am.  Patient husband Confirm appointment.  [place] Pharmacist Assistant [phone]

## 2021-12-14 ENCOUNTER — Ambulatory Visit (INDEPENDENT_AMBULATORY_CARE_PROVIDER_SITE_OTHER): Payer: Medicare Other

## 2021-12-14 ENCOUNTER — Ambulatory Visit: Payer: Medicare Other | Admitting: Cardiovascular Disease

## 2021-12-14 DIAGNOSIS — I5042 Chronic combined systolic (congestive) and diastolic (congestive) heart failure: Secondary | ICD-10-CM | POA: Diagnosis not present

## 2021-12-14 DIAGNOSIS — N2581 Secondary hyperparathyroidism of renal origin: Secondary | ICD-10-CM | POA: Diagnosis not present

## 2021-12-14 DIAGNOSIS — D631 Anemia in chronic kidney disease: Secondary | ICD-10-CM | POA: Diagnosis not present

## 2021-12-14 DIAGNOSIS — I480 Paroxysmal atrial fibrillation: Secondary | ICD-10-CM

## 2021-12-14 DIAGNOSIS — I132 Hypertensive heart and chronic kidney disease with heart failure and with stage 5 chronic kidney disease, or end stage renal disease: Secondary | ICD-10-CM | POA: Diagnosis not present

## 2021-12-14 DIAGNOSIS — M1030 Gout due to renal impairment, unspecified site: Secondary | ICD-10-CM | POA: Diagnosis not present

## 2021-12-14 DIAGNOSIS — N185 Chronic kidney disease, stage 5: Secondary | ICD-10-CM | POA: Diagnosis not present

## 2021-12-14 NOTE — Progress Notes (Signed)
Chronic Care Management Pharmacy Note  12/14/2021 Name:  [name] MRN:  [id number] DOB:  1946/06/18  Summary: Patient presents for CCM follow-up. Unfortunately it will be more expensive to switch to upstream pharmacy so will remain with [facility] at this time.  -PAP for Eliquis in progress. She will bring to cardiology follow-up this week.  Recommendations/Changes made from today's visit: Continue current medications   Plan: CPP follow-up 3 months   Subjective: [name] is an 76 y.o. year old female who is a primary patient of [name], [name], NP.  The CCM team was consulted for assistance with disease management and care coordination needs.    Engaged with patient by telephone for follow up visit in response to provider referral for pharmacy case management and/or care coordination services.   Consent to Services:  The patient was given information about Chronic Care Management services, agreed to services, and gave verbal consent prior to initiation of services.  Please see initial visit note for detailed documentation.   Patient Care Team: [name], [name], NP as PCP - General (Internal Medicine) Bensimhon, Shaune Pascal, MD as PCP - Advanced Heart Failure (Cardiology) Sueanne Margarita, MD as Consulting Physician (Cardiology) Paralee Cancel, MD as Consulting Physician (Orthopedic Surgery) Fleet Contras, MD as Consulting Physician (Nephrology) Obgyn, Myles Lipps, Glean Salvo, [facility] as Pharmacist (Pharmacist) Dannielle Karvonen, RN as [place] Management  Recent office visits: 11/14/2021 Wilfred Lacy NP (PCP) start Lactulose 20 g PRN, Decrease cored to 3.$RemoveBe'125mg'uxoYuMCKR$  BID, Change pantoprazole 20 mg daily, Increase fluoxetine to $RemoveBefor'20mg'TSQrphIkixjg$  daily.,Ambulatory referral to Psychiatry, Return in about 4 weeks   Recent consult visits: 11/29/21: Patient presented to Dr. Haroldine Laws (cardiology) 11/21/21: Patient presented to Dr. Stanford Breed (Vascular) for ESRD.  11/08/2021  Frann Rider NP (Neurology) No Medication Changes noted, Follow up 4 months 10/31/2021 Bambi Cottle LCSW (Behavioral Health) Unable to see note  Hospital visits: None in previous 6 months   Objective:  Lab Results  Component Value Date   CREATININE 3.09 (H) 11/29/2021   BUN 43 (H) 11/29/2021   GFR 13.63 (LL) 07/21/2021   GFRNONAA 15 (L) 11/29/2021   GFRAA 24 (L) 03/13/2020   NA 137 11/29/2021   K 4.3 11/29/2021   CALCIUM 9.3 11/29/2021   CO2 17 (L) 11/29/2021   GLUCOSE 88 11/29/2021    Lab Results  Component Value Date/Time   HGBA1C 4.7 (L) 07/23/2021 02:00 PM   HGBA1C 5.4 11/03/2020 11:36 AM   GFR 13.63 (LL) 07/21/2021 12:01 PM   GFR 13.66 (LL) 03/29/2021 02:52 PM    Last diabetic Eye exam: No results found for: HMDIABEYEEXA  Last diabetic Foot exam: No results found for: HMDIABFOOTEX   Lab Results  Component Value Date   CHOL 124 07/24/2021   HDL 34 (L) 07/24/2021   LDLCALC 71 07/24/2021   TRIG 95 07/24/2021   CHOLHDL 3.6 07/24/2021       Latest Ref Rng & Units 07/26/2021    3:37 AM 07/25/2021    6:20 AM 07/23/2021   10:43 AM  Hepatic Function  Total Protein 6.5 - 8.1 g/dL   4.6  C  Albumin 3.5 - 5.0 g/dL 2.1   2.3   2.4  C  AST 15 - 41 U/L   47  C  ALT 0 - 44 U/L   10  C  Alk Phosphatase 38 - 126 U/L   54  C  Total Bilirubin 0.3 - 1.2 mg/dL   1.6  C  C Corrected result    Lab Results  Component Value Date/Time   TSH 1.765 07/23/2021 01:22 PM   TSH 1.450 11/27/2019 04:16 PM   TSH 2.655 09/11/2011 08:25 AM       Latest Ref Rng & Units 12/08/2021    8:48 AM 11/24/2021    8:30 AM 11/10/2021    2:09 PM  CBC  Hemoglobin 12.0 - 15.0 g/dL 9.8   11.0   10.2      Lab Results  Component Value Date/Time   VD25OH <10 (L) 09/18/2011 05:35 AM   VD25OH 13 (L) 09/11/2011 08:25 AM    Clinical ASCVD: Yes  The ASCVD Risk score (Arnett DK, et al., 2019) failed to calculate for the following reasons:   The patient has a prior MI or stroke diagnosis        12/13/2021    9:26 AM 12/05/2021   11:05 AM 12/05/2021   11:01 AM  Depression screen PHQ 2/9  Decreased Interest 1 0 0  Down, Depressed, Hopeless 1 0 0  PHQ - 2 Score 2 0 0  Altered sleeping 0    Tired, decreased energy 1    Change in appetite 3    Feeling bad or failure about yourself  3    Trouble concentrating 1    Moving slowly or fidgety/restless 1    Suicidal thoughts 0    PHQ-9 Score 11    Difficult doing work/chores Somewhat difficult      Social History   Tobacco Use  Smoking Status Never  Smokeless Tobacco Never   BP Readings from Last 3 Encounters:  12/08/21 (!) 150/74  11/29/21 120/60  11/24/21 133/71   Pulse Readings from Last 3 Encounters:  12/08/21 70  11/29/21 70  11/24/21 72   Wt Readings from Last 3 Encounters:  11/29/21 109 lb 3.2 oz (49.5 kg)  11/21/21 108 lb (49 kg)  11/14/21 108 lb (49 kg)   BMI Readings from Last 3 Encounters:  11/29/21 17.90 kg/m  11/21/21 17.70 kg/m  11/14/21 17.70 kg/m    Assessment/Interventions: Review of patient past medical history, allergies, medications, health status, including review of consultants reports, laboratory and other test data, was performed as part of comprehensive evaluation and provision of chronic care management services.   SDOH:  (Social Determinants of Health) assessments and interventions performed: Yes  SDOH Screenings   Alcohol Screen: Low Risk    Last Alcohol Screening Score (AUDIT): 0  Depression (PHQ2-9): Medium Risk   PHQ-2 Score: 11  Financial Resource Strain: Low Risk    Difficulty of Paying Living Expenses: Not hard at all  Food Insecurity: No Food Insecurity   Worried About Charity fundraiser in the Last Year: Never true   Ran Out of Food in the Last Year: Never true  Housing: Low Risk    Last Housing Risk Score: 0  Physical Activity: Inactive   Days of Exercise per Week: 0 days   Minutes of Exercise per Session: 0 min  Social Connections: Engineer, building services  of Communication with Friends and Family: Twice a week   Frequency of Social Gatherings with Friends and Family: Twice a week   Attends Religious Services: More than 4 times per year   Active Member of [employer] or Organizations: Yes   Attends Archivist Meetings: 1 to 4 times per year   Marital Status: Married  Stress: No Stress Concern Present   Feeling of Stress : Not at all  Tobacco Use: Low Risk    Smoking Tobacco Use: Never   Smokeless Tobacco Use: Never   Passive Exposure: Not on file  Transportation Needs: No Transportation Needs   Lack of Transportation (Medical): No   Lack of Transportation (Non-Medical): No    CCM Care Plan  Allergies  Allergen Reactions   Warfarin Sodium Other (See Comments)    Caused her to have a stroke/left side of brain to hemorrhage    Ace Inhibitors Cough   Amiodarone Other (See Comments)    Pt with Restrictive Lung Disease by 10/2017 PFT with decreased DLCO   Amlodipine Swelling    Swelling in feet    Medications Reviewed Today     Reviewed by Dannielle Karvonen, RN (Registered Nurse) on 12/13/21 at [phone]  Med List Status: <None>   Medication Order Taking? Sig Documenting Provider Last Dose Status Informant  acetaminophen (TYLENOL) 500 MG tablet [id number] Yes Take 500 mg by mouth every 6 (six) hours as needed for mild pain or headache.  [provider] Taking Active Self  albuterol (PROVENTIL) (2.5 MG/3ML) 0.083% nebulizer solution [id number] Yes Take 3 mLs (2.5 mg total) by nebulization every 6 (six) hours as needed for wheezing or shortness of breath. Dx:J45.909 [name], [name], NP Taking Active Self  albuterol (VENTOLIN HFA) 108 (90 Base) MCG/ACT inhaler [id number] Yes INHALE 1-2 PUFFS BY MOUTH EVERY 6 HOURS AS NEEDED FOR WHEEZE OR SHORTNESS OF BREATH [name], [name], NP Taking Active   apixaban (ELIQUIS) 2.5 MG TABS tablet [id number] Yes Take 1 tablet (2.5 mg total) by mouth 2 (two) times daily. Bensimhon, Shaune Pascal, MD  Taking Active   atorvastatin (LIPITOR) 20 MG tablet [id number] Yes Take 1 tablet (20 mg total) by mouth daily. [name], NP Taking Active   budesonide-formoterol [facility]) 160-4.5 MCG/ACT inhaler [id number] Yes Inhale 2 puffs into the lungs 2 (two) times daily. [name], MD Taking Active Self           Med Note Nyoka Cowden, Delice Lesch   Wed Dec 13, 2021  9:16 AM) Patient states she takes only as needed.   carvedilol (COREG) 3.125 MG tablet [id number] Yes Take 1 tablet (3.125 mg total) by mouth 2 (two) times daily with a meal. [name], [name], NP Taking Active   docusate sodium (COLACE) 100 MG capsule [id number] Yes Take 100 mg by mouth daily. [provider] Taking Active   FLUoxetine (PROZAC) 20 MG capsule [id number] Yes Take 1 capsule (20 mg total) by mouth daily. [name], [name], NP Taking Active   furosemide (LASIX) 80 MG tablet [id number] Yes Take 80 mg by mouth 3 (three) times a week. [provider] Taking Active   lactulose (CHRONULAC) 10 GM/15ML solution [id number] Yes Take 30 mLs (20 g total) by mouth daily as needed for mild constipation or severe constipation. [name], NP Taking Active   mycophenolate (MYFORTIC) 180 MG EC tablet [id number] Yes Take 180 mg by mouth 2 (two) times daily. [provider] Taking Active Self           Med Note [name], MACI D   Thu Oct 22, 2019  5:55 PM)    ondansetron (ZOFRAN) 4 MG tablet [id number] Yes Take 1 tablet (4 mg total) by mouth every 8 (eight) hours as needed for up to 9 doses for nausea. [name], MD Taking Active Self  pantoprazole (PROTONIX) 20 MG tablet [id number] Yes Take 1 tablet (20 mg total) by mouth daily. [name],  [name], NP Taking Active   potassium chloride (MICRO-K) 10 MEQ CR capsule [id number] Yes Take 10 mEq by mouth 3 (three) times a week. [provider] Taking Active   predniSONE (DELTASONE) 5 MG tablet [id number] Yes Take 5 mg by mouth daily. [provider] Taking Active Self  tacrolimus (PROGRAF) 1 MG capsule [id number] Yes Take 1 mg by mouth 2 (two) times daily. 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   Renal transplant recipient 08/13/2021   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   Immunocompromised patient ([place]) 08/13/2021   Chronic atrial fibrillation (HCC)    Right leg weakness 07/23/2021   Erosive esophagitis/esophageal ulceration/duodenal ulceration 07/23/2021   Edema of right lower leg 07/23/2021   Acute GI bleeding 07/08/2021   Azotemia 07/08/2021   Pressure injury of skin 07/08/2021   Asthma, chronic, unspecified asthma severity, with acute exacerbation 06/15/2021   Chronic combined systolic and diastolic CHF (congestive heart failure) ([place]) 06/15/2021   Duodenal ulcer 03/29/2021   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) 02/11/2018   Hyperkalemia 02/03/2018   Lower extremity edema 12/10/2017   Presence of Watchman left atrial appendage closure device 10/04/2017   Dyspnea 07/26/2017   Atrial flutter ([place])    Oligouria    Hypertension    Fractures, stress    Eczema    Depression    Constipation    Arthritis    Exacerbation of asthma 04/01/2017   Closed fracture of right distal radius 10/18/2016   Proteinuria 09/04/2016   History of kidney transplant 2017 07/25/2015   Immunosuppression ([place]) 07/25/2015    Nonischemic dilated cardiomyopathy ([place])    S/P insertion of IVC (inferior vena caval) filter 04/07/2013   Use of cane as ambulatory aid 04/07/2013   S/P hip replacement 03/25/2012   Personal history of DVT/PE s/p IVC filter 12/08/2011   Obstructive sleep apnea 12/08/2011   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/24/2011   CKD (chronic kidney disease), stage V ([place]) 09/14/2011   Hypercalcemia    Gout 09/10/2011   Acute blood loss anemia secondary to diverticular bleed  09/04/2011   Absence of right hip joint with antibiotic pacer 09/03/2011   Traumatic seroma of thigh ([place]) 06/11/2011   Clostridium difficile colitis 06/09/2011   Leukocytosis 06/08/2011   Symptomatic anemia 06/06/2011   SINUSITIS- ACUTE-NOS 09/27/2010   Atrial fibrillation s/p AVN ablation/PPM implant, s/p LAAC with Watchman 11/25/2008   Cerebral artery occlusion with cerebral infarction ([place]) 09/28/2008   History of CVA with residual deficit 09/28/2008   Hemiplegia ([place]) 09/21/2008   Psoriasis 10/20/2007   Asthmatic bronchitis without complication 54/62/7035    Immunization History  Administered Date(s) Administered   Fluad Quad(high Dose 65+) 04/28/2019, 05/02/2020   Influenza Split 03/27/2012   Influenza Whole 04/29/2008   Influenza, High Dose Seasonal PF 04/19/2017, 03/16/2018   Influenza,inj,Quad PF,6+ Mos 04/24/2013   Influenza-Unspecified 04/29/2014, 03/17/2015, 03/29/2021   PFIZER(Purple Top)SARS-COV-2 Vaccination 09/09/2019, 09/23/2019, 06/24/2020  Pneumococcal Conjugate-13 03/02/2014   Pneumococcal Polysaccharide-23 04/25/2005, 09/18/2016   Tdap 05/08/2013    Conditions to be addressed/monitored:  Hypertension, Hyperlipidemia, Atrial Fibrillation, Heart Failure, Asthma, Depression, and Renal Transplant  Care Plan : General Pharmacy (Adult)  Updates made by Germaine Pomfret, RPH since 12/14/2021 12:00 AM     Problem: Hypertension, Hyperlipidemia, Atrial Fibrillation, Heart Failure, Asthma, Depression,  and Renal Transplant   Priority: High     Long-Range Goal: Patient-Specific Goal   Start Date: 12/12/2021  Expected End Date: 12/13/2022  This Visit's Progress: On track  Recent Progress: On track  Priority: High  Note:   Current Barriers:  Unable to independently afford treatment regimen  Pharmacist Clinical Goal(s):  Patient will verbalize ability to afford treatment regimen through collaboration with PharmD and provider.   Interventions: 1:1 collaboration with [name], [name], NP regarding development and update of comprehensive plan of care as evidenced by provider attestation and co-signature Inter-disciplinary care team collaboration (see longitudinal plan of care) Comprehensive medication review performed; medication list updated in electronic medical record  Heart Failure (Goal: manage symptoms and prevent exacerbations) -Controlled -Last ejection fraction: 35-40% (Date: Jan 2023) -HF type: Systolic -NYHA Class: III (marked limitation of activity) -Current treatment: Carvedilol 3.125 mg twice daily  Furosemide 80 mg three times weekly -Medications previously tried: NA  -Some swelling in legs, does keep her feet elevated.  -Recommended to continue current medication  Hypertension (BP goal <140/90) -Controlled -Current treatment: Carvedilol 3.125 mg twice daily  Furosemide 80 mg three times weekly -Medications previously tried: NA  -Current home readings: NA -Denies hypotensive/hypertensive symptoms -Recommended to continue current medication  Hyperlipidemia: (LDL goal < 100) -Controlled -Current treatment: Atorvastatin 20 mg daily -Medications previously tried: NA  -Recommended to continue current medication  Atrial Fibrillation (Goal: prevent stroke and major bleeding) -Controlled -CHADSVASC: 6 -Current treatment: Rate control: Carvedilol 3.125 mg twice daily  Anticoagulation: Eliquis 2.5 mg twice daily  -Medications previously tried: Warfarin -PAP  for Eliquis in progress. She will bring to cardiology follow-up this week. -Recommended to continue current medication  COPD (Goal: control symptoms and prevent exacerbations) -Controlled -Current treatment  Symbicort 160-4.5 mcg/act 2 puffs twice daily as needed  -Medications previously tried: NA  -Exacerbations requiring treatment in last 6 months: None -Patient reports consistent use of maintenance inhaler -Recommended to continue current medication  Depression/Anxiety (Goal: Achieve symptom remission) -Controlled -Current treatment: Fluoxetine 20 mg daily  -Medications previously tried/failed: NA -Recommended to continue current medication  History of Renal Transplant (Goal: Preserve kidney function) -Controlled -Current treatment  Mycophenolate EC 180 mg twice daily  Prednisone 5 mg daily  Tacrolimus 1 mg twice daily  -Medications previously tried: NA  -Recommended to continue current medication  Patient Goals/Self-Care Activities Patient will:  - check blood pressure weekly, document, and provide at future appointments  Follow Up Plan: Telephone follow up appointment with care management team member scheduled for:  03/29/2022 at 9:15 AM    Medication Assistance: Application for Eliquis  medication assistance program. in process.  Anticipated assistance start date TBD.  See plan of care for additional detail.  Compliance/Adherence/Medication fill history: Care Gaps: Shingrix Vaccine COVID-19 Vaccine (4- Booster)  Star-Rating Drugs: Atorvastatin 20 mg last filled [facility]  [facility]:  [id number] [place], [address] [place] [address] Phone: [phone] Fax: [phone]  [place], [address]  [place] [address] Phone: [phone] Fax: [phone]  Uses pill  box? Yes Pt endorses 100% compliance  We discussed: Current pharmacy is preferred with insurance plan and patient is satisfied with pharmacy services Patient decided to: Continue current medication management strategy  Care Plan and Follow Up Patient Decision:  Patient agrees to Care Plan and Follow-up.  Plan: Telephone follow up appointment with care management team member scheduled for:  03/29/2022 at [place], PharmD, Para March, CPP Clinical Pharmacist Practitioner  [facility]  [phone]

## 2021-12-14 NOTE — Patient Instructions (Signed)
Visit Information It was great speaking with you today!  Please let me know if you have any questions about our visit.   Goals Addressed             This Visit's Progress    Track and Manage Fluids and Swelling-Heart Failure   On track    Timeframe:  Long-Range Goal Priority:  High Start Date: 12/12/21                            Expected End Date: 12/13/22                      Follow Up within 30 days   - call office if I gain more than 2 pounds in one day or 5 pounds in one week - use salt in moderation - watch for swelling in feet, ankles and legs every day - weigh myself daily    Why is this important?   It is important to check your weight daily and watch how much salt and liquids you have.  It will help you to manage your heart failure.    Notes:        Patient Care Plan: General Pharmacy (Adult)     Problem Identified: Hypertension, Hyperlipidemia, Atrial Fibrillation, Heart Failure, Asthma, Depression, and Renal Transplant   Priority: High     Long-Range Goal: Patient-Specific Goal   Start Date: 12/12/2021  Expected End Date: 12/13/2022  This Visit's Progress: On track  Recent Progress: On track  Priority: High  Note:   Current Barriers:  Unable to independently afford treatment regimen  Pharmacist Clinical Goal(s):  Patient will verbalize ability to afford treatment regimen through collaboration with PharmD and provider.   Interventions: 1:1 collaboration with [name], [name], NP regarding development and update of comprehensive plan of care as evidenced by provider attestation and co-signature Inter-disciplinary care team collaboration (see longitudinal plan of care) Comprehensive medication review performed; medication list updated in electronic medical record  Heart Failure (Goal: manage symptoms and prevent exacerbations) -Controlled -Last ejection fraction: 35-40% (Date: Jan 2023) -HF type: Systolic -NYHA Class: III (marked limitation of  activity) -Current treatment: Carvedilol 3.125 mg twice daily  Furosemide 80 mg three times weekly -Medications previously tried: NA  -Some swelling in legs, does keep her feet elevated.  -Recommended to continue current medication  Hypertension (BP goal <140/90) -Controlled -Current treatment: Carvedilol 3.125 mg twice daily  Furosemide 80 mg three times weekly -Medications previously tried: NA  -Current home readings: NA -Denies hypotensive/hypertensive symptoms -Recommended to continue current medication  Hyperlipidemia: (LDL goal < 100) -Controlled -Current treatment: Atorvastatin 20 mg daily -Medications previously tried: NA  -Recommended to continue current medication  Atrial Fibrillation (Goal: prevent stroke and major bleeding) -Controlled -CHADSVASC: 6 -Current treatment: Rate control: Carvedilol 3.125 mg twice daily  Anticoagulation: Eliquis 2.5 mg twice daily  -Medications previously tried: Warfarin -PAP for Eliquis in progress. She will bring to cardiology follow-up this week. -Recommended to continue current medication  COPD (Goal: control symptoms and prevent exacerbations) -Controlled -Current treatment  Symbicort 160-4.5 mcg/act 2 puffs twice daily as needed  -Medications previously tried: NA  -Exacerbations requiring treatment in last 6 months: None -Patient reports consistent use of maintenance inhaler -Recommended to continue current medication  Depression/Anxiety (Goal: Achieve symptom remission) -Controlled -Current treatment: Fluoxetine 20 mg daily  -Medications previously tried/failed: NA -Recommended to continue current medication  History of Renal Transplant (Goal: Preserve  kidney function) -Controlled -Current treatment  Mycophenolate EC 180 mg twice daily  Prednisone 5 mg daily  Tacrolimus 1 mg twice daily  -Medications previously tried: NA  -Recommended to continue current medication  Patient Goals/Self-Care Activities Patient  will:  - check blood pressure weekly, document, and provide at future appointments  Follow Up Plan: Telephone follow up appointment with care management team member scheduled for:  03/29/2022 at 9:15 AM    Patient agreed to services and verbal consent obtained.   Patient verbalizes understanding of instructions and care plan provided today and agrees to view in [place]. Active MyChart status and patient understanding of how to access instructions and care plan via MyChart confirmed with patient.     Junius Argyle, PharmD, Para [place], CPP Clinical Pharmacist Practitioner  [facility]  [phone]

## 2021-12-15 ENCOUNTER — Telehealth: Payer: Self-pay

## 2021-12-15 ENCOUNTER — Other Ambulatory Visit: Payer: Self-pay | Admitting: Nephrology

## 2021-12-15 DIAGNOSIS — R600 Localized edema: Secondary | ICD-10-CM

## 2021-12-15 NOTE — Telephone Encounter (Signed)
   Telephone encounter was:  Successful.  12/15/2021 Name: [name] MRN: [id number] DOB: 1945/11/14  [name] is a 76 y.o. year old female who is a primary care patient of [name], [name], NP . The community resource team was consulted for assistance with  food pantry list.  Care guide performed the following interventions: Spoke with patient, she asked that a list of food pantries be mailed to her home.  Verified patient's home address list will be mailed out next week. Letter saved in Epic.  Follow Up Plan:  Care guide will follow up with patient by phone over the next 7 days.  Fareed Fung, AAS Paralegal, [place] [facility]  [address], [place] [address] ??[name]  ?? [phone]   [email or web address]

## 2021-12-18 DIAGNOSIS — I5042 Chronic combined systolic (congestive) and diastolic (congestive) heart failure: Secondary | ICD-10-CM | POA: Diagnosis not present

## 2021-12-18 DIAGNOSIS — D631 Anemia in chronic kidney disease: Secondary | ICD-10-CM | POA: Diagnosis not present

## 2021-12-18 DIAGNOSIS — N2581 Secondary hyperparathyroidism of renal origin: Secondary | ICD-10-CM | POA: Diagnosis not present

## 2021-12-18 DIAGNOSIS — M1030 Gout due to renal impairment, unspecified site: Secondary | ICD-10-CM | POA: Diagnosis not present

## 2021-12-18 DIAGNOSIS — I132 Hypertensive heart and chronic kidney disease with heart failure and with stage 5 chronic kidney disease, or end stage renal disease: Secondary | ICD-10-CM | POA: Diagnosis not present

## 2021-12-18 DIAGNOSIS — N185 Chronic kidney disease, stage 5: Secondary | ICD-10-CM | POA: Diagnosis not present

## 2021-12-19 ENCOUNTER — Ambulatory Visit
Admission: RE | Admit: 2021-12-19 | Discharge: 2021-12-19 | Disposition: A | Discharge status: Home / Self Care | Payer: Medicare Other | Source: Ambulatory Visit | Attending: Nephrology | Admitting: Nephrology

## 2021-12-19 DIAGNOSIS — R600 Localized edema: Secondary | ICD-10-CM

## 2021-12-19 IMAGING — US US EXTREM LOW VENOUS*R*
1 series · 13 of 24 positions shown · non-contrast
Comparison: None Available.

CLINICAL DATA: Right lower extremity edema.



[Series 1: us extrem low venous*right* · 0.07mm/px · 13 of 34 slices shown]
[im 1/34]
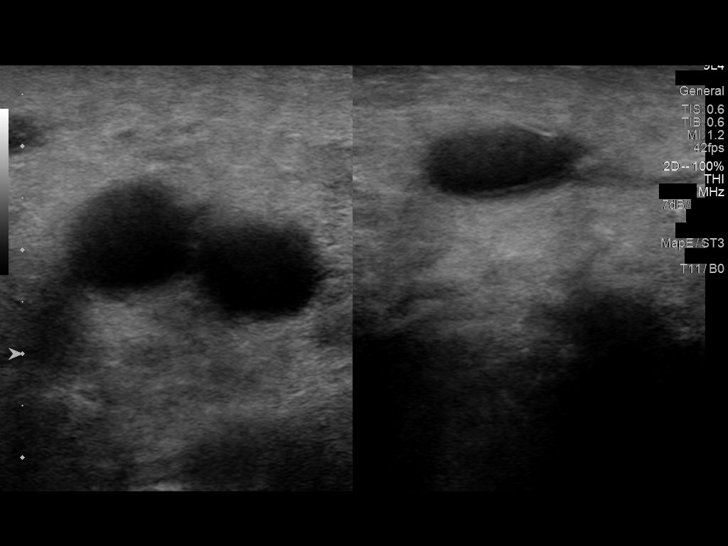
[im 3/34]
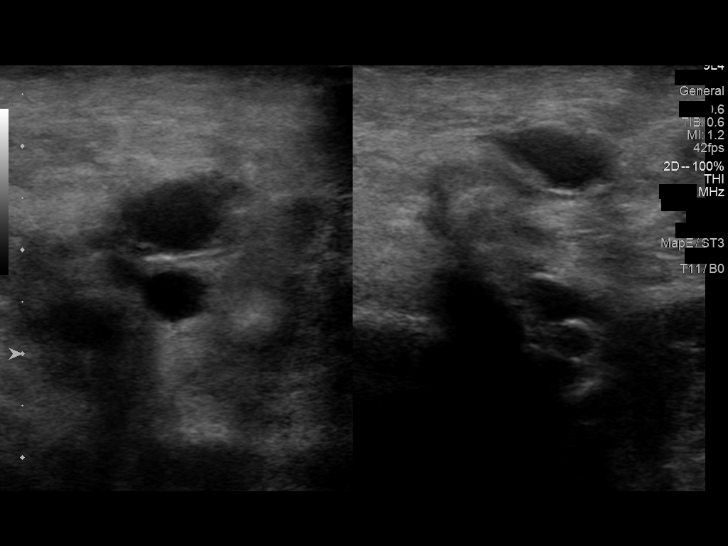
[im 6/34]
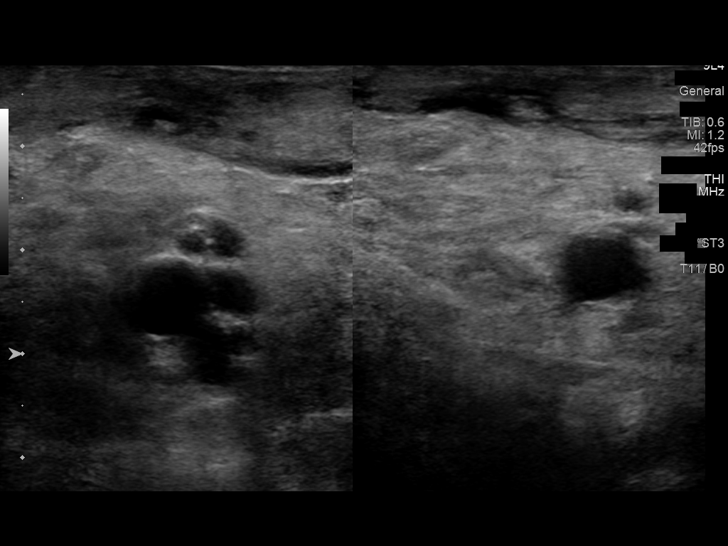
[im 9/34]
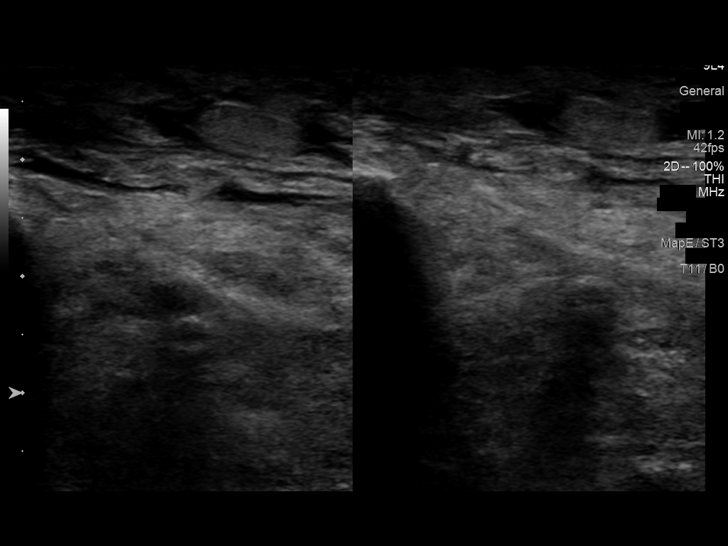
[im 12/34]
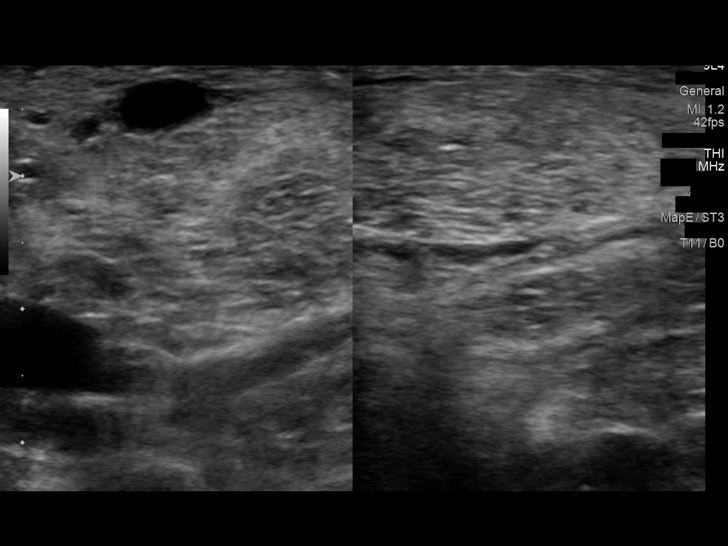
[im 15/34]
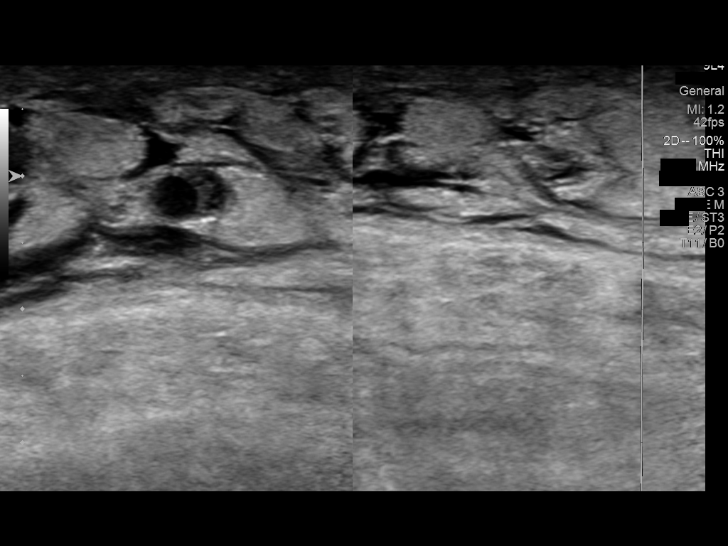
[im 18/34]
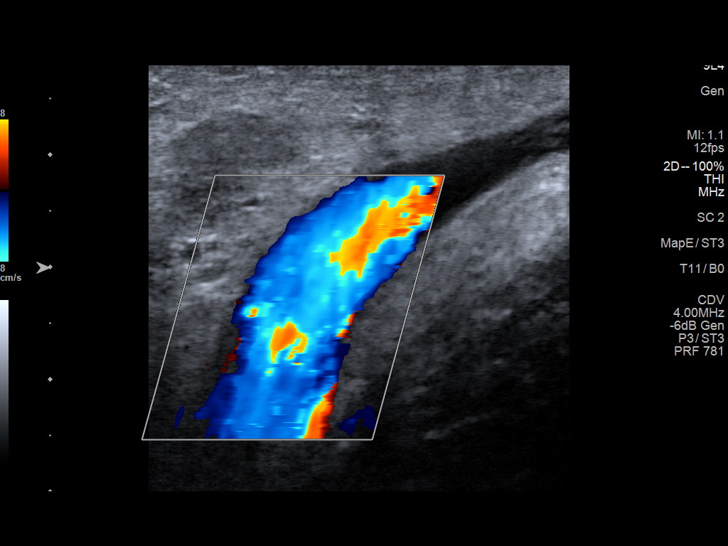
[im 19/34]
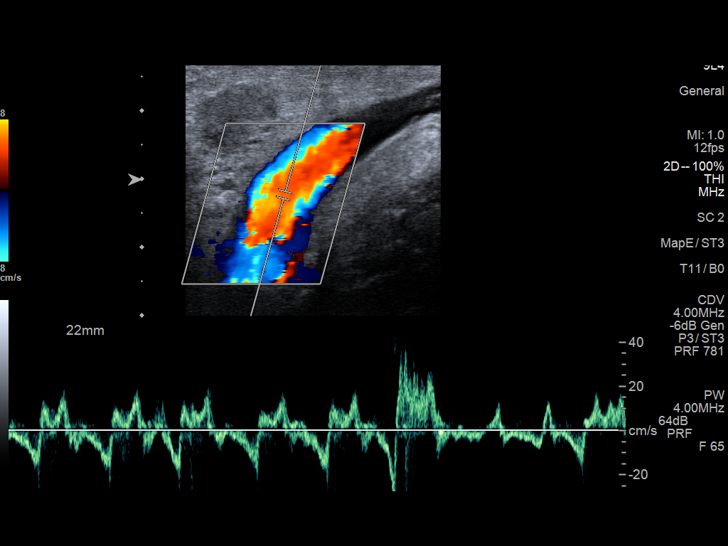
[im 22/34]
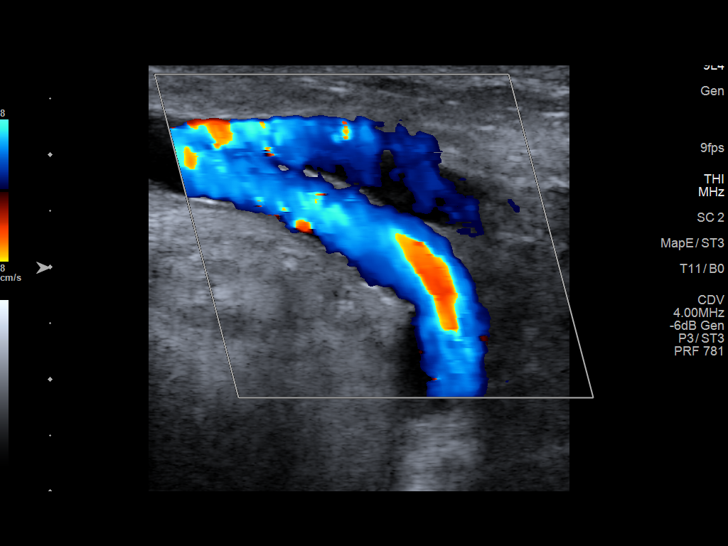
[im 25/34]
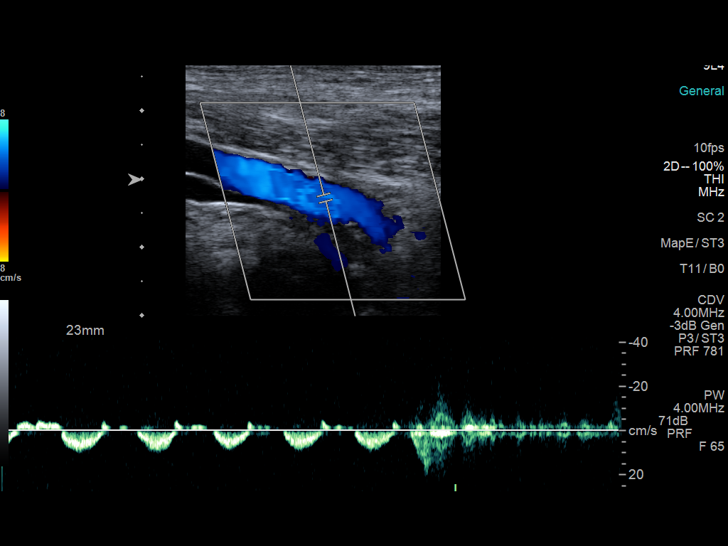
[im 28/34]
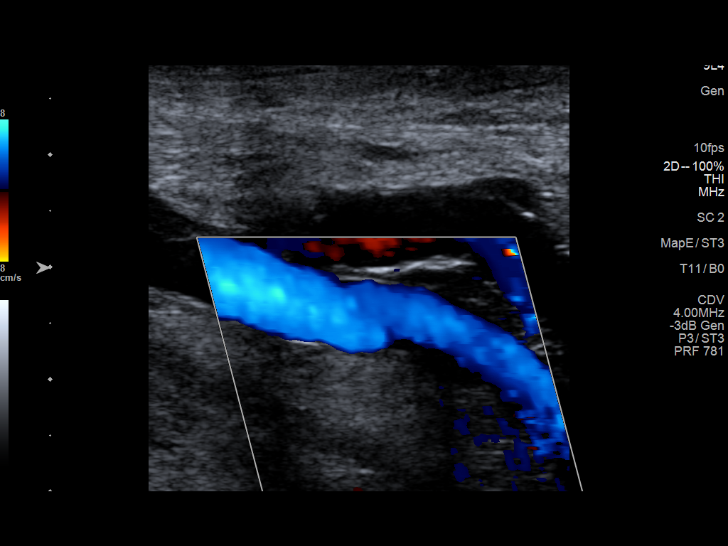
[im 31/34]
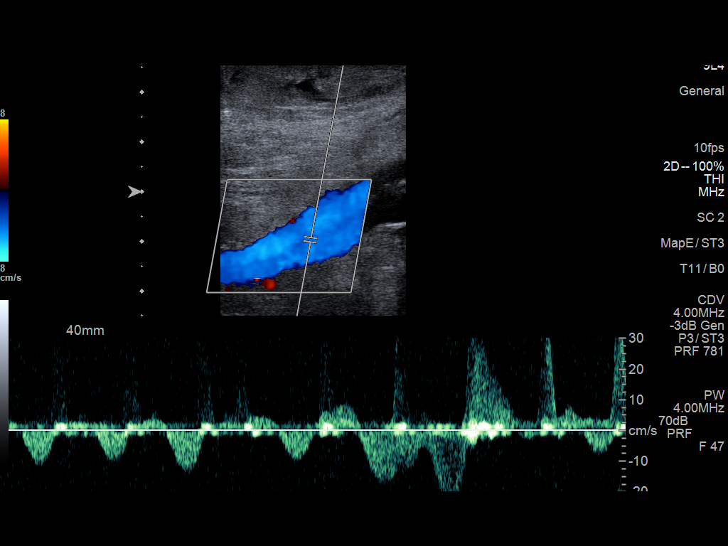
[im 34/34]
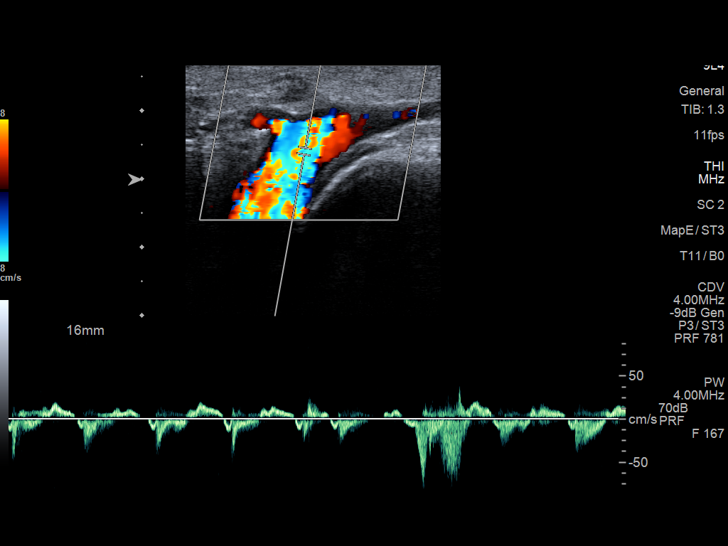

[13 of 24 positions shown; findings below may reference images not displayed]

FINDINGS: Contralateral Common Femoral Vein: Respiratory phasicity is normal
and symmetric with the symptomatic side. No evidence of thrombus.
Normal compressibility.

Common Femoral Vein: No evidence of thrombus. Normal
compressibility, respiratory phasicity and response to augmentation.

Saphenofemoral Junction: No evidence of thrombus. Normal
compressibility and flow on color Doppler imaging.

Profunda Femoral Vein: No evidence of thrombus. Normal
compressibility and flow on color Doppler imaging.

Femoral Vein: No evidence of thrombus. Normal compressibility,
respiratory phasicity and response to augmentation.

Popliteal Vein: No evidence of thrombus. Normal compressibility,
respiratory phasicity and response to augmentation.

Calf Veins: No evidence of thrombus. Normal compressibility and flow
on color Doppler imaging.

Superficial Great Saphenous Vein: No evidence of thrombus. Normal
compressibility.

Venous Reflux:  None.

Other Findings: No evidence of superficial thrombophlebitis or
abnormal fluid collection.
IMPRESSION: No evidence of right lower extremity deep venous thrombosis.

## 2021-12-21 DIAGNOSIS — N185 Chronic kidney disease, stage 5: Secondary | ICD-10-CM | POA: Diagnosis not present

## 2021-12-21 DIAGNOSIS — I132 Hypertensive heart and chronic kidney disease with heart failure and with stage 5 chronic kidney disease, or end stage renal disease: Secondary | ICD-10-CM | POA: Diagnosis not present

## 2021-12-21 DIAGNOSIS — N2581 Secondary hyperparathyroidism of renal origin: Secondary | ICD-10-CM | POA: Diagnosis not present

## 2021-12-21 DIAGNOSIS — I5042 Chronic combined systolic (congestive) and diastolic (congestive) heart failure: Secondary | ICD-10-CM | POA: Diagnosis not present

## 2021-12-21 DIAGNOSIS — M1030 Gout due to renal impairment, unspecified site: Secondary | ICD-10-CM | POA: Diagnosis not present

## 2021-12-21 DIAGNOSIS — D631 Anemia in chronic kidney disease: Secondary | ICD-10-CM | POA: Diagnosis not present

## 2021-12-22 ENCOUNTER — Telehealth: Payer: Self-pay

## 2021-12-22 ENCOUNTER — Encounter (HOSPITAL_COMMUNITY)
Admission: RE | Admit: 2021-12-22 | Discharge: 2021-12-22 | Disposition: A | Discharge status: Home / Self Care | Payer: Medicare Other | Source: Ambulatory Visit | Attending: Nephrology | Admitting: Nephrology

## 2021-12-22 VITALS — BP 138/80 | HR 71 | Temp 97.2°F

## 2021-12-22 DIAGNOSIS — N185 Chronic kidney disease, stage 5: Secondary | ICD-10-CM | POA: Diagnosis not present

## 2021-12-22 DIAGNOSIS — D631 Anemia in chronic kidney disease: Secondary | ICD-10-CM | POA: Diagnosis not present

## 2021-12-22 LAB — IRON AND TIBC
Iron: 40 ug/dL (ref 28–170)
Saturation Ratios: 21 % (ref 10.4–31.8)
TIBC: 190 ug/dL — ABNORMAL LOW (ref 250–450)
UIBC: 150 ug/dL

## 2021-12-22 LAB — FERRITIN: Ferritin: 172 ng/mL (ref 11–307)

## 2021-12-22 LAB — POCT HEMOGLOBIN-HEMACUE: Hemoglobin: 9.6 g/dL — ABNORMAL LOW (ref 12.0–15.0)

## 2021-12-22 MED ORDER — EPOETIN ALFA-EPBX 10000 UNIT/ML IJ SOLN
20000.0000 [IU] | INTRAMUSCULAR | Status: DC
  Administered 2021-12-22: 20000 [IU] via SUBCUTANEOUS

## 2021-12-22 MED ORDER — EPOETIN ALFA-EPBX 10000 UNIT/ML IJ SOLN
INTRAMUSCULAR | Status: AC
  Filled 2021-12-22: qty 2

## 2021-12-22 NOTE — Telephone Encounter (Signed)
   Telephone encounter was:  Unsuccessful.  12/22/2021 Name: [name] MRN: [id number] DOB: 1946-02-10  Unsuccessful outbound call made today to assist with:  Food Insecurity  Outreach Attempt:  2nd Attempt  A HIPAA compliant voice message was left requesting a return call.  Instructed patient to call back at [phone]. Left message on voicemail for patient to return my call regarding food pantry list mailed to her home.   Erlene Devita, AAS Paralegal, [facility] [employer]  [address], [place] [address] ??[email or web address]  ?? [phone]   [email or web address]

## 2021-12-25 ENCOUNTER — Ambulatory Visit (INDEPENDENT_AMBULATORY_CARE_PROVIDER_SITE_OTHER): Payer: Medicare Other | Admitting: Family Medicine

## 2021-12-25 ENCOUNTER — Encounter: Payer: Self-pay | Admitting: Family Medicine

## 2021-12-25 VITALS — BP 128/84 | HR 76 | Temp 97.6°F | Wt 113.2 lb

## 2021-12-25 DIAGNOSIS — R1903 Right lower quadrant abdominal swelling, mass and lump: Secondary | ICD-10-CM | POA: Diagnosis not present

## 2021-12-25 DIAGNOSIS — Z23 Encounter for immunization: Secondary | ICD-10-CM | POA: Diagnosis not present

## 2021-12-25 NOTE — Progress Notes (Signed)
   [name] is a 76 y.o. female who presents today for an office visit.  Assessment/Plan:  New/Acute Problems:    Right lower quadrant abdominal mass Large Nontender patient asymptomatic Patient additionally notes that approximately 5 days ago, however given the size highly suspect that this has been there longer than that Patient history includes right-sided renal transplant, concerned this might be a hernia Abdominal ultrasound ordered Discussed return precautions and to go to the ED with worsening abdominal pain or other worrisome symptoms as provided AVS Follow-up 2 weeks     Subjective:  HPI:  Patient noticed right "hip" swelling after she fell 5 days ago.  Patient denies that she has any pain with this.  Patient ambulates with a walker.  She is accompanied by her husband, [name] who is also providing history.  Patient ambulates with walker at baseline, denies any change with this.  Patient denies any bowel or bladder incontinence or any other symptom.  ROS Patient denies any nausea, vomiting, abdominal pain, diarrhea, melena, hematochezia, hip pain      Objective:  Physical Exam: BP 128/84 (BP Location: Right Arm, Patient Position: Sitting, Cuff Size: Small)   Pulse 76   Temp 97.6 F (36.4 C) (Temporal)   Wt 113 lb 3.2 oz (51.3 kg)   SpO2 98%   BMI 18.55 kg/m   Gen: No acute distress, resting comfortably Abdomen: Right lower abdomen abdominal mass, 7 inches x 5 inches, nontender, mobile, not reducible Neuro: Grossly normal, moves all extremities Psych: Normal affect and thought content      Jane Banda, MD, MS

## 2021-12-25 NOTE — Patient Instructions (Signed)
For this pelvic mass, we are ordering an ultrasound.  The office will call you to set this up. If getting more tender or painful mildly, please come back sooner. If you develop severe abdominal pain, nausea, vomiting, diarrhea or any other worrisome symptom please go to the emergency department

## 2021-12-25 NOTE — Assessment & Plan Note (Signed)
Large Nontender patient asymptomatic Patient additionally notes that approximately 5 days ago, however given the size highly suspect that this has been there longer than that Patient history includes right-sided renal transplant, concerned this might be a hernia Abdominal ultrasound ordered Discussed return precautions and to go to the ED with worsening abdominal pain or other worrisome symptoms as provided AVS Follow-up 2 weeks

## 2021-12-26 ENCOUNTER — Telehealth: Payer: Self-pay | Admitting: Nurse Practitioner

## 2021-12-26 DIAGNOSIS — N2581 Secondary hyperparathyroidism of renal origin: Secondary | ICD-10-CM | POA: Diagnosis not present

## 2021-12-26 DIAGNOSIS — D631 Anemia in chronic kidney disease: Secondary | ICD-10-CM | POA: Diagnosis not present

## 2021-12-26 DIAGNOSIS — M1030 Gout due to renal impairment, unspecified site: Secondary | ICD-10-CM | POA: Diagnosis not present

## 2021-12-26 DIAGNOSIS — I132 Hypertensive heart and chronic kidney disease with heart failure and with stage 5 chronic kidney disease, or end stage renal disease: Secondary | ICD-10-CM | POA: Diagnosis not present

## 2021-12-26 DIAGNOSIS — I5042 Chronic combined systolic (congestive) and diastolic (congestive) heart failure: Secondary | ICD-10-CM | POA: Diagnosis not present

## 2021-12-26 DIAGNOSIS — N185 Chronic kidney disease, stage 5: Secondary | ICD-10-CM | POA: Diagnosis not present

## 2021-12-26 NOTE — Telephone Encounter (Signed)
Called [facility], left verbal orders on VM

## 2021-12-26 NOTE — Telephone Encounter (Signed)
[name] from [facility] [phone] is requesting verbal orders for speech therapy. 1xw for 1w 0xw for 2w 1xw for 5w

## 2021-12-28 ENCOUNTER — Telehealth: Payer: Self-pay | Admitting: Nurse Practitioner

## 2021-12-28 ENCOUNTER — Telehealth: Payer: Self-pay

## 2021-12-28 DIAGNOSIS — I5042 Chronic combined systolic (congestive) and diastolic (congestive) heart failure: Secondary | ICD-10-CM | POA: Diagnosis not present

## 2021-12-28 DIAGNOSIS — D631 Anemia in chronic kidney disease: Secondary | ICD-10-CM | POA: Diagnosis not present

## 2021-12-28 DIAGNOSIS — N2581 Secondary hyperparathyroidism of renal origin: Secondary | ICD-10-CM | POA: Diagnosis not present

## 2021-12-28 DIAGNOSIS — I132 Hypertensive heart and chronic kidney disease with heart failure and with stage 5 chronic kidney disease, or end stage renal disease: Secondary | ICD-10-CM | POA: Diagnosis not present

## 2021-12-28 DIAGNOSIS — M1030 Gout due to renal impairment, unspecified site: Secondary | ICD-10-CM | POA: Diagnosis not present

## 2021-12-28 DIAGNOSIS — N185 Chronic kidney disease, stage 5: Secondary | ICD-10-CM | POA: Diagnosis not present

## 2021-12-28 NOTE — Telephone Encounter (Signed)
error 

## 2021-12-28 NOTE — Telephone Encounter (Signed)
   Telephone encounter was:  Unsuccessful.  12/28/2021 Name: [name] MRN: [id number] DOB: [birth year]  Unsuccessful outbound call made today to assist with:  Food Insecurity  Outreach Attempt:  3rd Attempt.  Referral closed unable to contact patient.  A HIPAA compliant voice message was left requesting a return call.  Instructed patient to call back at [phone]. Left message on home and cell voicemail for patient to return my call regarding food pantry list mailed to her home.   Thaxton Pelley, AAS Paralegal, [facility] [employer]  [address],  [address] ??[email or web address]  ?? [phone]   [email or web address]

## 2021-12-28 NOTE — Telephone Encounter (Signed)
Called & left verbal order for PT on number provided.

## 2021-12-28 NOTE — Telephone Encounter (Signed)
[name] from [facility] is asking for a verbal order for PT. 1xw for 9w. Can leave a voice message at this # [phone]

## 2021-12-29 ENCOUNTER — Inpatient Hospital Stay: Admission: RE | Admit: 2021-12-29 | Payer: Medicare Other | Source: Ambulatory Visit

## 2021-12-30 DIAGNOSIS — F330 Major depressive disorder, recurrent, mild: Secondary | ICD-10-CM | POA: Diagnosis not present

## 2021-12-30 DIAGNOSIS — I4819 Other persistent atrial fibrillation: Secondary | ICD-10-CM | POA: Diagnosis not present

## 2021-12-30 DIAGNOSIS — D849 Immunodeficiency, unspecified: Secondary | ICD-10-CM | POA: Diagnosis not present

## 2021-12-30 DIAGNOSIS — N2581 Secondary hyperparathyroidism of renal origin: Secondary | ICD-10-CM | POA: Diagnosis not present

## 2021-12-30 DIAGNOSIS — Z9181 History of falling: Secondary | ICD-10-CM | POA: Diagnosis not present

## 2021-12-30 DIAGNOSIS — I132 Hypertensive heart and chronic kidney disease with heart failure and with stage 5 chronic kidney disease, or end stage renal disease: Secondary | ICD-10-CM | POA: Diagnosis not present

## 2021-12-30 DIAGNOSIS — K2210 Ulcer of esophagus without bleeding: Secondary | ICD-10-CM | POA: Diagnosis not present

## 2021-12-30 DIAGNOSIS — J45909 Unspecified asthma, uncomplicated: Secondary | ICD-10-CM | POA: Diagnosis not present

## 2021-12-30 DIAGNOSIS — Z7901 Long term (current) use of anticoagulants: Secondary | ICD-10-CM | POA: Diagnosis not present

## 2021-12-30 DIAGNOSIS — E46 Unspecified protein-calorie malnutrition: Secondary | ICD-10-CM | POA: Diagnosis not present

## 2021-12-30 DIAGNOSIS — M1030 Gout due to renal impairment, unspecified site: Secondary | ICD-10-CM | POA: Diagnosis not present

## 2021-12-30 DIAGNOSIS — Z86718 Personal history of other venous thrombosis and embolism: Secondary | ICD-10-CM | POA: Diagnosis not present

## 2021-12-30 DIAGNOSIS — E7800 Pure hypercholesterolemia, unspecified: Secondary | ICD-10-CM | POA: Diagnosis not present

## 2021-12-30 DIAGNOSIS — G4733 Obstructive sleep apnea (adult) (pediatric): Secondary | ICD-10-CM | POA: Diagnosis not present

## 2021-12-30 DIAGNOSIS — I739 Peripheral vascular disease, unspecified: Secondary | ICD-10-CM | POA: Diagnosis not present

## 2021-12-30 DIAGNOSIS — M150 Primary generalized (osteo)arthritis: Secondary | ICD-10-CM | POA: Diagnosis not present

## 2021-12-30 DIAGNOSIS — H9190 Unspecified hearing loss, unspecified ear: Secondary | ICD-10-CM | POA: Diagnosis not present

## 2021-12-30 DIAGNOSIS — Z7951 Long term (current) use of inhaled steroids: Secondary | ICD-10-CM | POA: Diagnosis not present

## 2021-12-30 DIAGNOSIS — Z7952 Long term (current) use of systemic steroids: Secondary | ICD-10-CM | POA: Diagnosis not present

## 2021-12-30 DIAGNOSIS — I69351 Hemiplegia and hemiparesis following cerebral infarction affecting right dominant side: Secondary | ICD-10-CM | POA: Diagnosis not present

## 2021-12-30 DIAGNOSIS — D631 Anemia in chronic kidney disease: Secondary | ICD-10-CM | POA: Diagnosis not present

## 2021-12-30 DIAGNOSIS — N185 Chronic kidney disease, stage 5: Secondary | ICD-10-CM | POA: Diagnosis not present

## 2021-12-30 DIAGNOSIS — I5042 Chronic combined systolic (congestive) and diastolic (congestive) heart failure: Secondary | ICD-10-CM | POA: Diagnosis not present

## 2021-12-30 DIAGNOSIS — I428 Other cardiomyopathies: Secondary | ICD-10-CM | POA: Diagnosis not present

## 2021-12-30 DIAGNOSIS — F419 Anxiety disorder, unspecified: Secondary | ICD-10-CM | POA: Diagnosis not present

## 2021-12-31 ENCOUNTER — Emergency Department (HOSPITAL_BASED_OUTPATIENT_CLINIC_OR_DEPARTMENT_OTHER)
Admission: EM | Admit: 2021-12-31 | Discharge: 2021-12-31 | Disposition: A | Discharge status: Home / Self Care | Payer: Medicare Other | Attending: Emergency Medicine | Admitting: Emergency Medicine

## 2021-12-31 ENCOUNTER — Encounter (HOSPITAL_BASED_OUTPATIENT_CLINIC_OR_DEPARTMENT_OTHER): Payer: Self-pay

## 2021-12-31 ENCOUNTER — Other Ambulatory Visit: Payer: Self-pay

## 2021-12-31 ENCOUNTER — Emergency Department (HOSPITAL_BASED_OUTPATIENT_CLINIC_OR_DEPARTMENT_OTHER): Payer: Medicare Other | Admitting: Radiology

## 2021-12-31 DIAGNOSIS — D649 Anemia, unspecified: Secondary | ICD-10-CM | POA: Diagnosis not present

## 2021-12-31 DIAGNOSIS — I480 Paroxysmal atrial fibrillation: Secondary | ICD-10-CM | POA: Diagnosis not present

## 2021-12-31 DIAGNOSIS — R7989 Other specified abnormal findings of blood chemistry: Secondary | ICD-10-CM | POA: Diagnosis not present

## 2021-12-31 DIAGNOSIS — I509 Heart failure, unspecified: Secondary | ICD-10-CM | POA: Insufficient documentation

## 2021-12-31 DIAGNOSIS — S7001XA Contusion of right hip, initial encounter: Secondary | ICD-10-CM | POA: Diagnosis not present

## 2021-12-31 DIAGNOSIS — S301XXA Contusion of abdominal wall, initial encounter: Secondary | ICD-10-CM | POA: Diagnosis not present

## 2021-12-31 DIAGNOSIS — I120 Hypertensive chronic kidney disease with stage 5 chronic kidney disease or end stage renal disease: Secondary | ICD-10-CM | POA: Diagnosis not present

## 2021-12-31 DIAGNOSIS — M25551 Pain in right hip: Secondary | ICD-10-CM | POA: Diagnosis not present

## 2021-12-31 DIAGNOSIS — N189 Chronic kidney disease, unspecified: Secondary | ICD-10-CM | POA: Diagnosis not present

## 2021-12-31 DIAGNOSIS — Z7901 Long term (current) use of anticoagulants: Secondary | ICD-10-CM | POA: Insufficient documentation

## 2021-12-31 DIAGNOSIS — W19XXXA Unspecified fall, initial encounter: Secondary | ICD-10-CM | POA: Diagnosis not present

## 2021-12-31 DIAGNOSIS — N186 End stage renal disease: Secondary | ICD-10-CM | POA: Diagnosis not present

## 2021-12-31 DIAGNOSIS — S3991XA Unspecified injury of abdomen, initial encounter: Secondary | ICD-10-CM | POA: Diagnosis present

## 2021-12-31 LAB — CBC
HCT: 25.5 % — ABNORMAL LOW (ref 36.0–46.0)
Hemoglobin: 7.8 g/dL — ABNORMAL LOW (ref 12.0–15.0)
MCH: 26.4 pg (ref 26.0–34.0)
MCHC: 30.6 g/dL (ref 30.0–36.0)
MCV: 86.4 fL (ref 80.0–100.0)
Platelets: 223 10*3/uL (ref 150–400)
RBC: 2.95 MIL/uL — ABNORMAL LOW (ref 3.87–5.11)
RDW: 20 % — ABNORMAL HIGH (ref 11.5–15.5)
WBC: 10.7 10*3/uL — ABNORMAL HIGH (ref 4.0–10.5)
nRBC: 0.2 % (ref 0.0–0.2)

## 2021-12-31 LAB — BASIC METABOLIC PANEL
Anion gap: 9 (ref 5–15)
BUN: 42 mg/dL — ABNORMAL HIGH (ref 8–23)
CO2: 25 mmol/L (ref 22–32)
Calcium: 9.7 mg/dL (ref 8.9–10.3)
Chloride: 107 mmol/L (ref 98–111)
Creatinine, Ser: 3.37 mg/dL — ABNORMAL HIGH (ref 0.44–1.00)
GFR, Estimated: 14 mL/min — ABNORMAL LOW (ref >60)
Glucose, Bld: 145 mg/dL — ABNORMAL HIGH (ref 70–99)
Potassium: 4.4 mmol/L (ref 3.5–5.1)
Sodium: 141 mmol/L (ref 135–145)

## 2021-12-31 IMAGING — DX DG HIP (WITH OR WITHOUT PELVIS) 2-3V*R*
5 series · 5 of 5 positions shown · non-contrast
Comparison: X-ray [DATE]

CLINICAL DATA: Fall, right hip pain

EXAM:
DG HIP (WITH OR WITHOUT PELVIS) 2-3V RIGHT

[pelvis ap (1 of 2)]
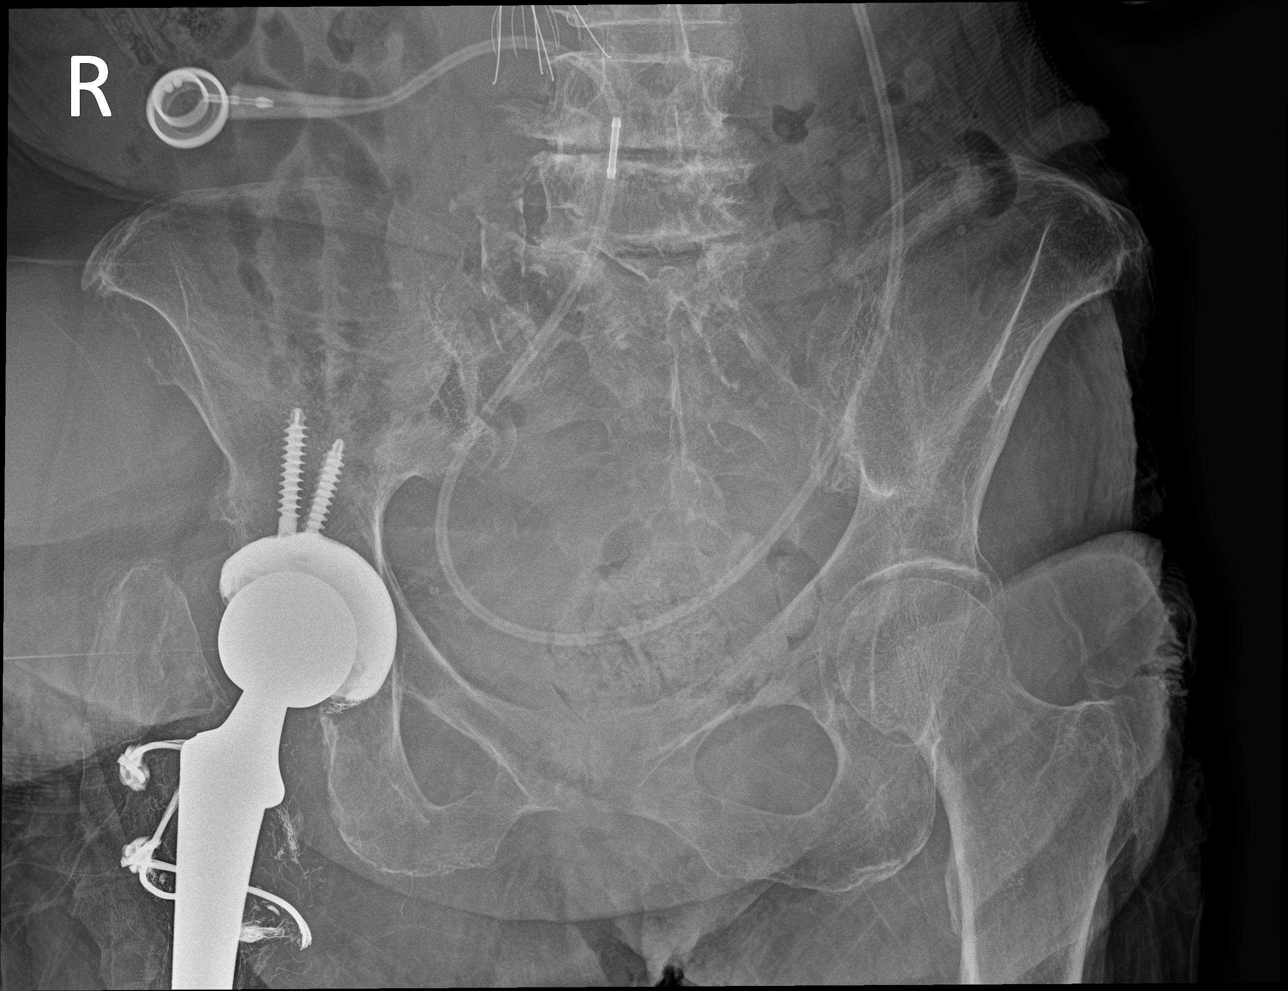

[hip ap (1 of 2)]
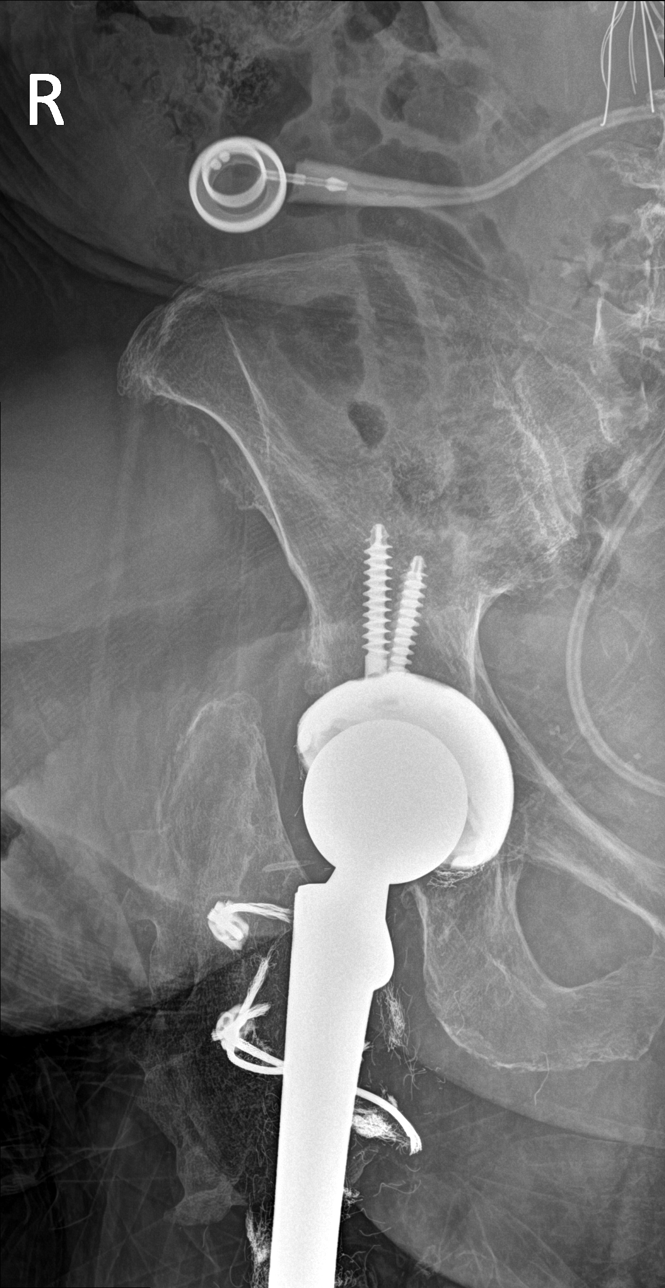

[hip lat]
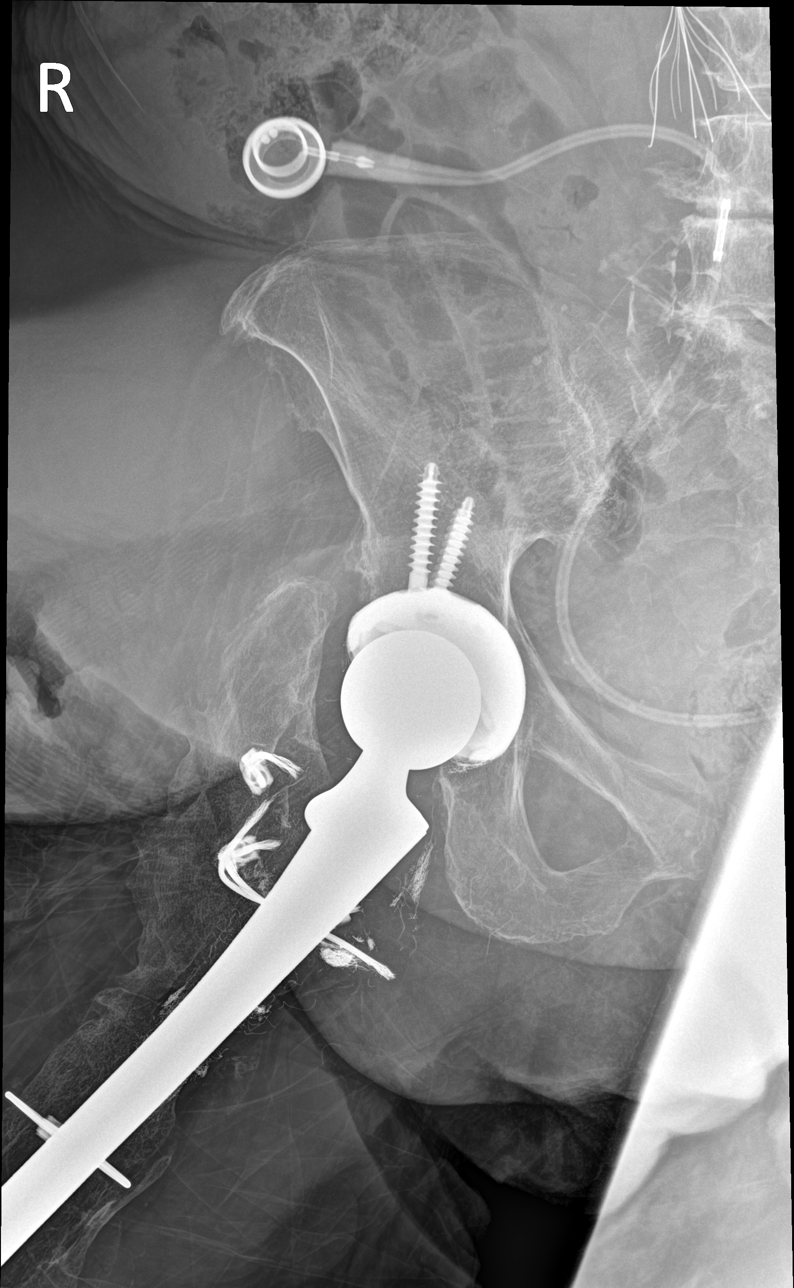

[pelvis ap (2 of 2)]
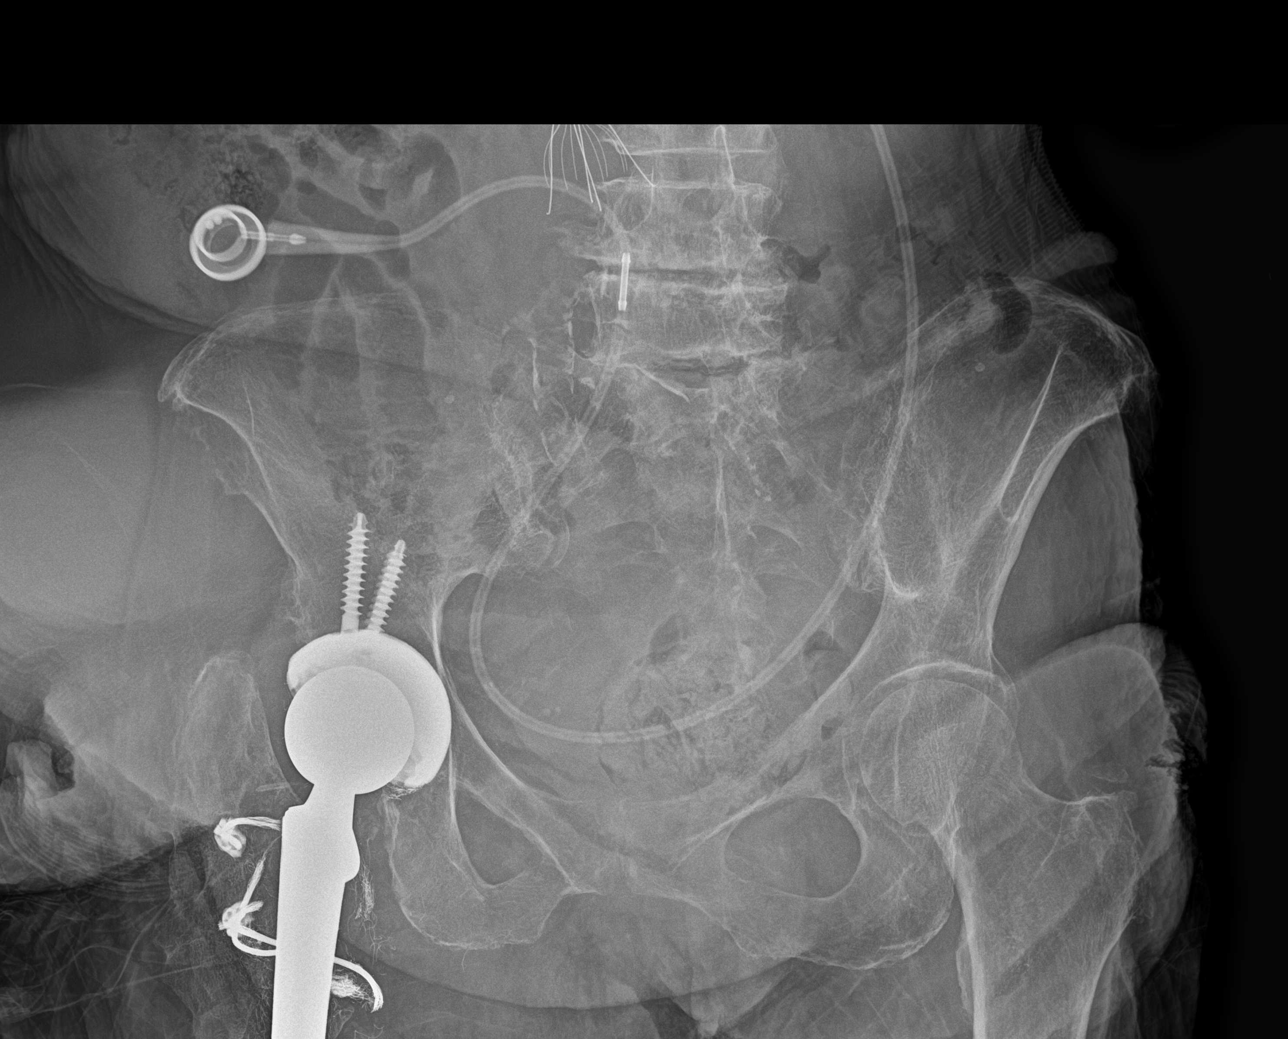

[hip ap (2 of 2)]
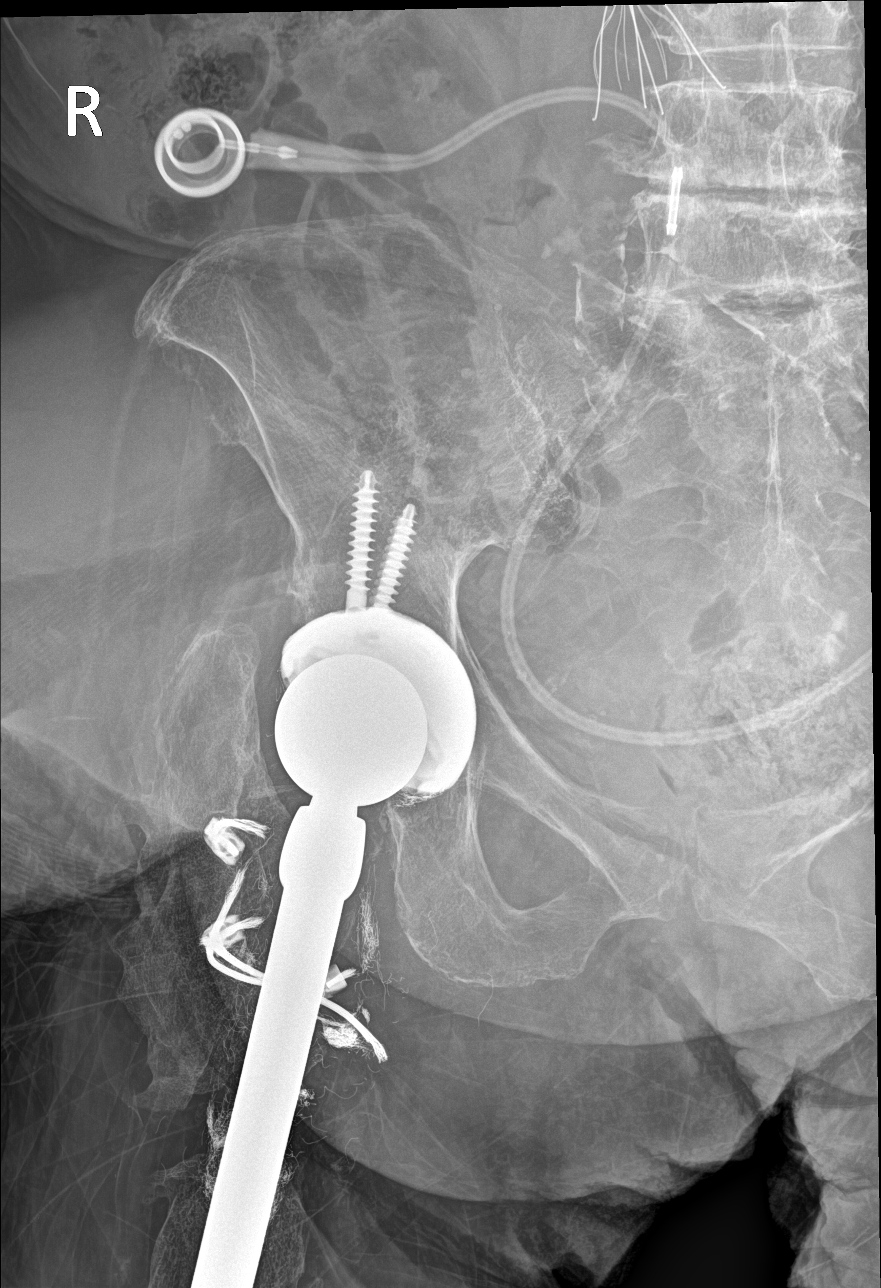

[5 of 5 positions shown; findings below may reference images not displayed]

FINDINGS: Diffuse osseous demineralization. Patient is status post proximal
right femoral resection and right total hip arthroplasty. Age
indeterminate fractures of both proximal cerclage wires. The distal
cerclage wire appears intact. Arthroplasty components are
appropriately aligned without dislocation. Pelvic bony ring appears
intact without diastasis.
IMPRESSION: 1. Prior right proximal femoral resection with right total hip
arthroplasty. There are age-indeterminate fractures of both proximal
cerclage wires. The distal cerclage wire appears intact.
2. Diffuse osseous demineralization without evidence of acute
fracture.

## 2021-12-31 MED ORDER — MORPHINE SULFATE (PF) 4 MG/ML IV SOLN
4.0000 mg | Freq: Once | INTRAVENOUS | Status: AC
  Administered 2021-12-31: 4 mg via INTRAVENOUS
  Filled 2021-12-31: qty 1

## 2021-12-31 MED ORDER — HYDROCODONE-ACETAMINOPHEN 5-325 MG PO TABS: 1.0000 | ORAL_TABLET | Freq: Four times a day (QID) | ORAL | 0 refills | Status: DC | PRN

## 2021-12-31 NOTE — ED Triage Notes (Signed)
Patient here POV from Home.  Endorses falling today at 1400. States she had a CVA a few weeks PTA and it has been affecting her Right Side. Endorses Pain and Swelling to Right Hip.   No Head Injury. Endorses taking Eliquis.  NAD Noted during Triage. A&Ox4. GCS 15. BIB Personal Wheelchair.

## 2021-12-31 NOTE — ED Provider Notes (Signed)
[facility] EMERGENCY DEPT Provider Note   CSN: [id number] Arrival date & time: 12/31/21  1805     History  Chief Complaint  Patient presents with   [name]    [name] is a 76 y.o. female.   Fall  Patient has history of stroke, cardiomyopathy, hypercalcemia, DVT, paroxysmal atrial fibrillation, hypertension, cardiomyopathy, CHF patient presents to the ED for evaluation after a fall.  Patient states she had a fall last week landing on her right side.  She has had some pain and swelling in her right abdomen and right hip since that time.  Patient states she had another fall today around noon but she thinks that she caught herself.  Patient does take Eliquis.  She has not had any fevers or chills.  The pain has been increasing so she came to the ED     Home Medications Prior to Admission medications   Medication Sig Start Date End Date Taking? Authorizing Provider  HYDROcodone-acetaminophen (NORCO/VICODIN) 5-325 MG tablet Take 1 tablet by mouth every 6 (six) hours as needed. 12/31/21  Yes Dorie Rank, MD  acetaminophen (TYLENOL) 500 MG tablet Take 500 mg by mouth every 6 (six) hours as needed for mild pain or headache.     [provider]  albuterol (PROVENTIL) (2.5 MG/3ML) 0.083% nebulizer solution Take 3 mLs (2.5 mg total) by nebulization every 6 (six) hours as needed for wheezing or shortness of breath. Dx:J45.909 06/21/21   [name], [name], NP  albuterol (VENTOLIN HFA) 108 (90 Base) MCG/ACT inhaler INHALE 1-2 PUFFS BY MOUTH EVERY 6 HOURS AS NEEDED FOR WHEEZE OR SHORTNESS OF BREATH 08/09/21   [name], [name], NP  apixaban (ELIQUIS) 2.5 MG TABS tablet Take 1 tablet (2.5 mg total) by mouth 2 (two) times daily. 11/29/21   Bensimhon, Shaune Pascal, MD  atorvastatin (LIPITOR) 20 MG tablet Take 1 tablet (20 mg total) by mouth daily. 11/23/21   [name], [name], NP  budesonide-formoterol (SYMBICORT) 160-4.5 MCG/ACT inhaler Inhale 2 puffs into the lungs 2 (two)  times daily. 06/13/21   [name], MD  carvedilol (COREG) 3.125 MG tablet Take 1 tablet (3.125 mg total) by mouth 2 (two) times daily with a meal. 11/14/21   [name], [name], NP  docusate sodium (COLACE) 100 MG capsule Take 100 mg by mouth daily.    [provider]  FLUoxetine (PROZAC) 20 MG capsule Take 1 capsule (20 mg total) by mouth daily. 11/14/21   [name], [name], NP  furosemide (LASIX) 80 MG tablet Take 80 mg by mouth 3 (three) times a week.    [provider]  lactulose (CHRONULAC) 10 GM/15ML solution Take 30 mLs (20 g total) by mouth daily as needed for mild constipation or severe constipation. 11/14/21   [name], [name], NP  mycophenolate (MYFORTIC) 180 MG EC tablet Take 180 mg by mouth 2 (two) times daily.    [provider]  ondansetron (ZOFRAN) 4 MG tablet Take 1 tablet (4 mg total) by mouth every 8 (eight) hours as needed for up to 9 doses for nausea. 03/25/21   [name], MD  pantoprazole (PROTONIX) 20 MG tablet Take 1 tablet (20 mg total) by mouth daily. 11/14/21   [name], [name], NP  potassium chloride (MICRO-K) 10 MEQ CR capsule Take 10 mEq by mouth 3 (three) times a week.    [provider]  predniSONE (DELTASONE) 5 MG tablet Take 5 mg by mouth daily. 02/21/21   [provider]  tacrolimus (PROGRAF) 1 MG capsule Take 1 mg by mouth 2 (two) times daily.    [provider]      Allergies    Warfarin sodium, Ace inhibitors, Amiodarone, and Amlodipine    Review of Systems   Review of Systems  Constitutional:  Negative for fever.    Physical Exam Updated Vital Signs BP 119/74   Pulse 74   Temp 97.7 F (36.5 C)   Resp 19   SpO2 96%  Physical Exam Vitals and nursing note reviewed.  Constitutional:      Appearance: She is well-developed.     Comments: , Elderly, frail  HENT:     Head: Normocephalic and atraumatic.     Right Ear: External ear normal.     Left Ear: External ear normal.  Eyes:      General: No scleral icterus.       Right eye: No discharge.        Left eye: No discharge.     Conjunctiva/sclera: Conjunctivae normal.  Neck:     Trachea: No tracheal deviation.  Cardiovascular:     Rate and Rhythm: Normal rate and regular rhythm.  Pulmonary:     Effort: Pulmonary effort is normal. No respiratory distress.     Breath sounds: Normal breath sounds. No stridor. No wheezing or rales.  Abdominal:     General: Bowel sounds are normal. There is no distension.     Palpations: Abdomen is soft.     Tenderness: There is no abdominal tenderness. There is no guarding or rebound.     Comments: Hematoma, edema noted right abdominal pannus, tender to the touch  Musculoskeletal:        General: Tenderness present. No deformity.     Cervical back: Neck supple.     Comments: Bruising and tenderness noted along the right hip, patient able to extend her hip and flex her hip without discomfort, no shortening noted  Skin:    General: Skin is warm and dry.     Findings: No rash.  Neurological:     General: No focal deficit present.     Mental Status: She is alert.     Cranial Nerves: No cranial nerve deficit (no facial droop, extraocular movements intact, no slurred speech).     Sensory: No sensory deficit.     Motor: No abnormal muscle tone or seizure activity.     Coordination: Coordination normal.  Psychiatric:        Mood and Affect: Mood normal.     ED Results / Procedures / Treatments   Labs (all labs ordered are listed, but only abnormal results are displayed) Labs Reviewed  CBC - Abnormal; Notable for the following components:      Result Value   WBC 10.7 (*)    RBC 2.95 (*)    Hemoglobin 7.8 (*)    HCT 25.5 (*)    RDW 20.0 (*)    All other components within normal limits  BASIC METABOLIC PANEL - Abnormal; Notable for the following components:   Glucose, Bld 145 (*)    BUN 42 (*)    Creatinine, Ser 3.37 (*)    GFR, Estimated 14 (*)    All other components  within normal limits    EKG None  Radiology DG Hip Unilat  With Pelvis 2-3 Views Right  Result Date: 12/31/2021 CLINICAL DATA:  Fall, right hip pain EXAM: DG HIP (WITH OR WITHOUT PELVIS) 2-3V RIGHT COMPARISON:  X-ray 03/25/2012  FINDINGS: Diffuse osseous demineralization. Patient is status post proximal right femoral resection and right total hip arthroplasty. Age indeterminate fractures of both proximal cerclage wires. The distal cerclage wire appears intact. Arthroplasty components are appropriately aligned without dislocation. Pelvic bony ring appears intact without diastasis. IMPRESSION: 1. Prior right proximal femoral resection with right total hip arthroplasty. There are age-indeterminate fractures of both proximal cerclage wires. The distal cerclage wire appears intact. 2. Diffuse osseous demineralization without evidence of acute fracture. Electronically Signed   By: Davina Poke D.O.   On: 12/31/2021 19:23    Procedures Procedures    Medications Ordered in ED Medications  morphine (PF) 4 MG/ML injection 4 mg (4 mg Intravenous Given 12/31/21 1953)    ED Course/ Medical Decision Making/ A&P Clinical Course as of 12/31/21 2048  Sun Dec 31, 2021  1939 DG Hip Unilat  With Pelvis 2-3 Views Right Hip x-ray images and radiology report reviewed.  No acute fracture [JK]  2024 CBC(!) Hemoglobin decreased compared to previous values [JK]  3419 Basic metabolic panel(!) Creatinine elevated but similar compared to previous values [JK]    Clinical Course User Index [JK] Dorie Rank, MD                           Medical Decision Making Problems Addressed: Abdominal wall hematoma, initial encounter: complicated acute illness or injury Anemia, unspecified type: chronic illness or injury with exacerbation, progression, or side effects of treatment Chronic kidney disease, unspecified CKD stage: chronic illness or injury Contusion of right hip, initial encounter: acute illness or  injury  Amount and/or Complexity of Data Reviewed External Data Reviewed: notes.    Details: Notes reviewed from primary care doctor office.  Patient has an abdominal ultrasound scheduled this week Labs: ordered. Decision-making details documented in ED Course. Radiology: ordered and independent interpretation performed. Decision-making details documented in ED Course.  Risk Prescription drug management.  Patient presented to the ED for evaluation of pain and swelling in her right hip.  As well as the right abdominal wall.  Patient saw her doctor recently.  They were concerned about possible hernia and she was referred for abdominal ultrasound.  Patient's not have any vomiting or diarrhea.  Doubt incarcerated hernia at this time.  Exam is suggestive of possible abdominal wall hematoma as well as hip hematoma.  X-rays do not show any signs of fracture or dislocation.  CT scan unfortunate is not available at the facility at this time.  The CT scanner is broken.  I do not think at this time she requires emergent transfer for advanced imaging as her symptoms are improving and she is not having any gastrointestinal symptoms.  Hemoglobin is decreased but she is not having any acute bleeding and her exam does appear to indicate the source of her drop in hemoglobin.  Evaluation and diagnostic testing in the emergency department does not suggest an emergent condition requiring admission or immediate intervention beyond what has been performed at this time.  The patient is safe for discharge and has been instructed to return immediately for worsening symptoms, change in symptoms or any other concerns.         Final Clinical Impression(s) / ED Diagnoses Final diagnoses:  Abdominal wall hematoma, initial encounter  Anemia, unspecified type  Contusion of right hip, initial encounter  Chronic kidney disease, unspecified CKD stage    Rx / DC Orders ED Discharge Orders  Ordered     HYDROcodone-acetaminophen (NORCO/VICODIN) 5-325 MG tablet  Every 6 hours PRN        12/31/21 2046              Dorie Rank, MD 12/31/21 2051

## 2021-12-31 NOTE — Discharge Instructions (Addendum)
Take the medications as needed for pain.  Follow-up with the ultrasound as scheduled.  Return to the [facility] emergency room for fevers chills worsening symptoms

## 2022-01-02 ENCOUNTER — Ambulatory Visit
Admission: RE | Admit: 2022-01-02 | Discharge: 2022-01-02 | Disposition: A | Discharge status: Home / Self Care | Payer: Medicare Other | Source: Ambulatory Visit | Attending: Family Medicine | Admitting: Family Medicine

## 2022-01-02 DIAGNOSIS — D631 Anemia in chronic kidney disease: Secondary | ICD-10-CM | POA: Diagnosis not present

## 2022-01-02 DIAGNOSIS — R1903 Right lower quadrant abdominal swelling, mass and lump: Secondary | ICD-10-CM

## 2022-01-02 DIAGNOSIS — N185 Chronic kidney disease, stage 5: Secondary | ICD-10-CM | POA: Diagnosis not present

## 2022-01-02 DIAGNOSIS — M1030 Gout due to renal impairment, unspecified site: Secondary | ICD-10-CM | POA: Diagnosis not present

## 2022-01-02 DIAGNOSIS — I132 Hypertensive heart and chronic kidney disease with heart failure and with stage 5 chronic kidney disease, or end stage renal disease: Secondary | ICD-10-CM | POA: Diagnosis not present

## 2022-01-02 DIAGNOSIS — N2581 Secondary hyperparathyroidism of renal origin: Secondary | ICD-10-CM | POA: Diagnosis not present

## 2022-01-02 DIAGNOSIS — R1900 Intra-abdominal and pelvic swelling, mass and lump, unspecified site: Secondary | ICD-10-CM | POA: Diagnosis not present

## 2022-01-02 DIAGNOSIS — I5042 Chronic combined systolic (congestive) and diastolic (congestive) heart failure: Secondary | ICD-10-CM | POA: Diagnosis not present

## 2022-01-02 IMAGING — US US PELVIS LIMITED
1 series · 14 of 23 positions shown · non-contrast
Comparison: Right hip series [DATE]. Right lower extremity
color flow duplex Doppler [DATE].

CLINICAL DATA: Abdominopelvic mass.  History of falls.

EXAM:
US PELVIS LIMITED
TECHNIQUE: Ultrasound examination of the pelvic soft tissues was performed in
the area of clinical concern.

[Series 1: us pelvis limited · 0.23mm/px · 23 acquisitions, 14 frames shown]
[im 1/23]
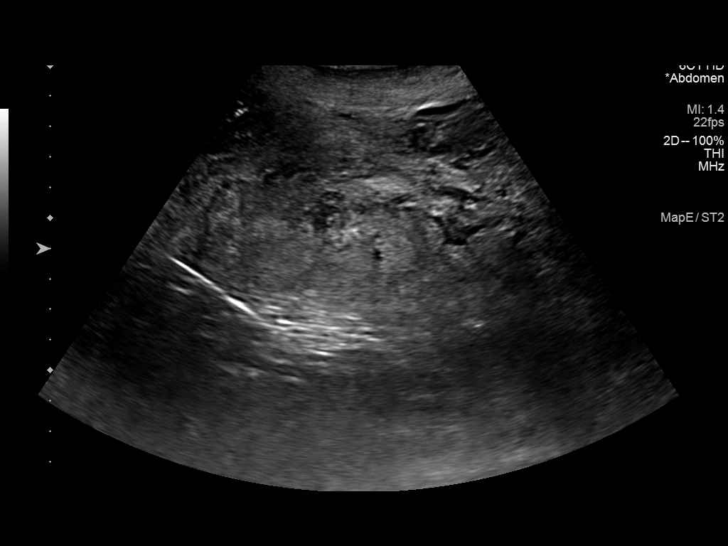
[im 3/23]
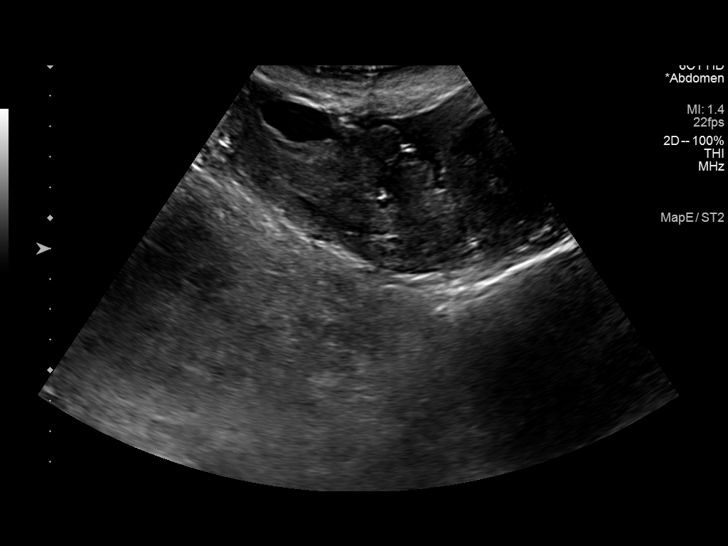
[im 5/23]
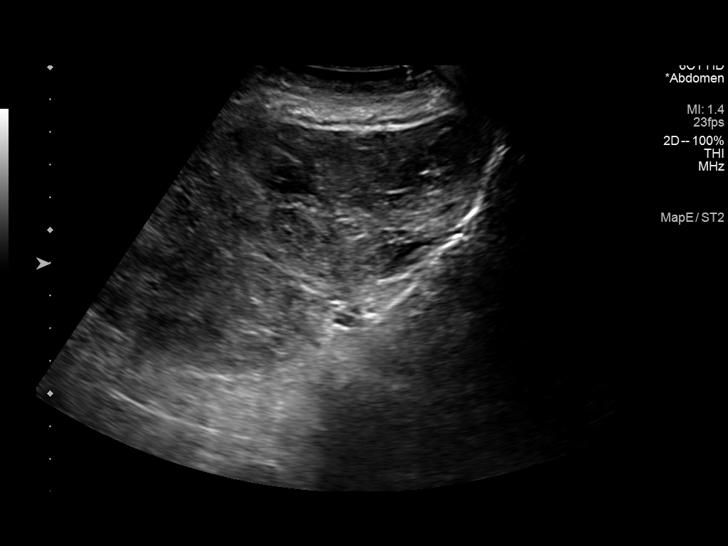
[im 6/23]
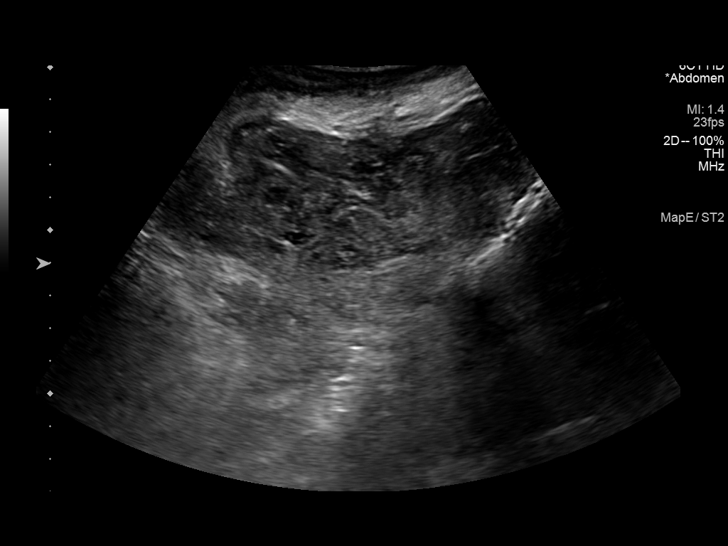
[im 8/23]
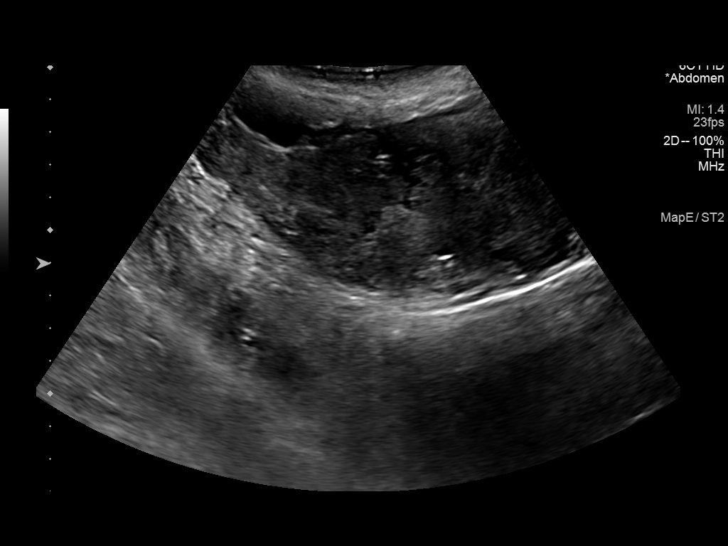
[im 10/23]
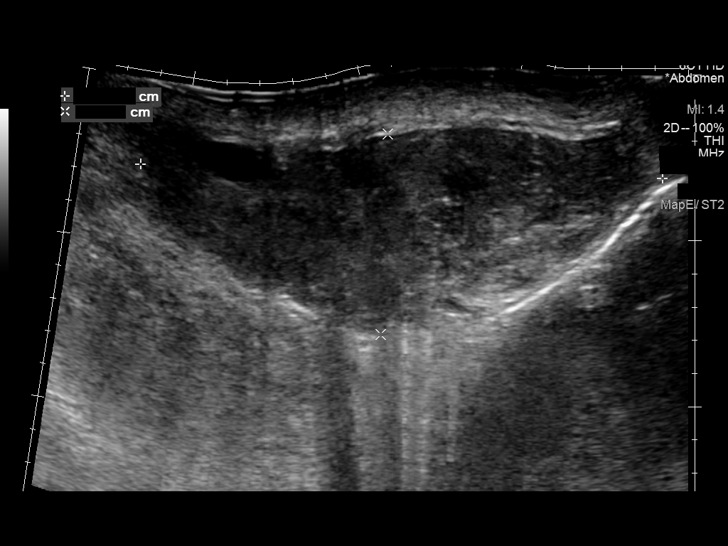
[im 11/23]
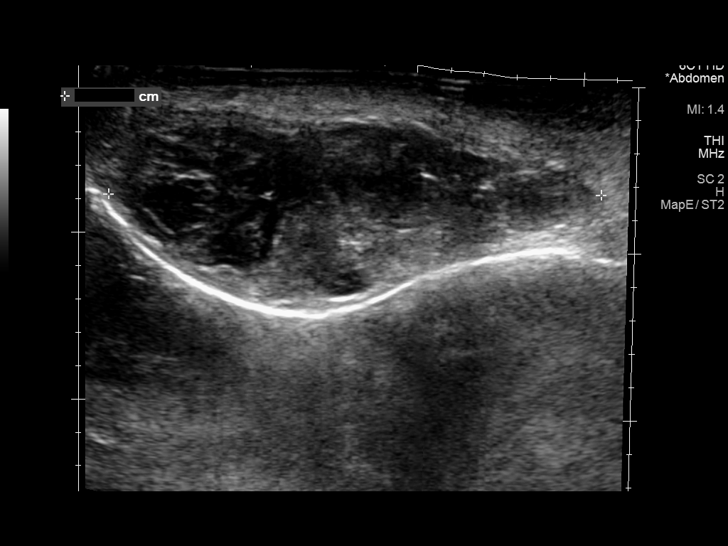
[im 13/23]
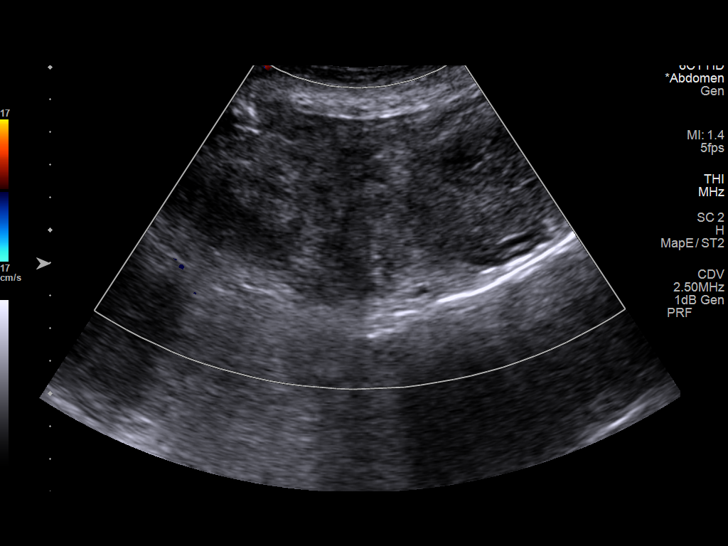
[im 14/23]
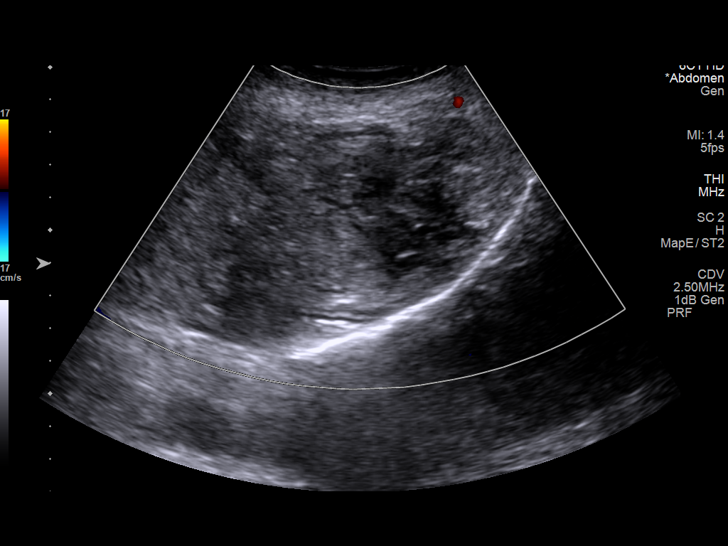
[im 16/23]
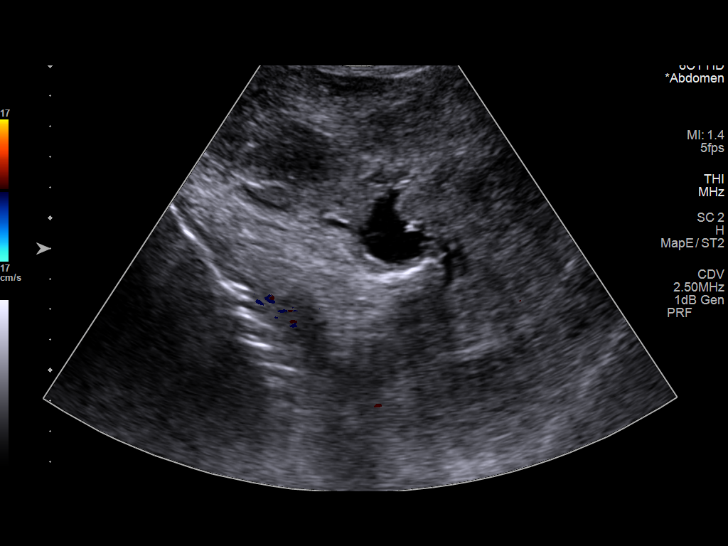
[im 18/23]
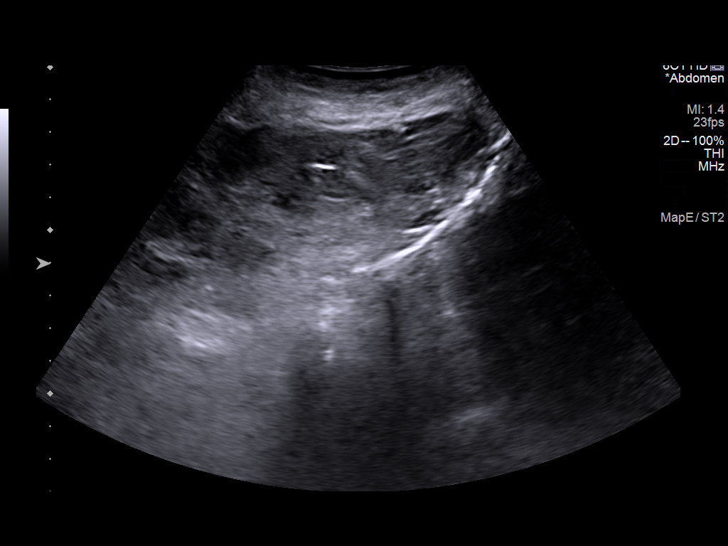
[im 19/23]
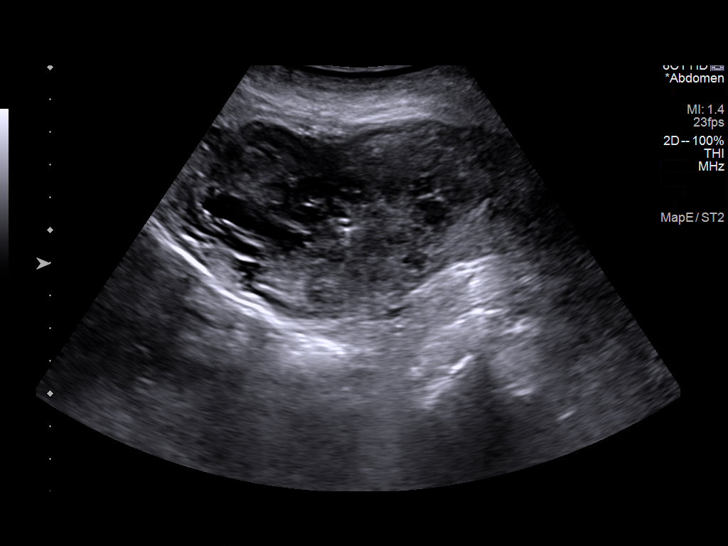
[im 21/23]
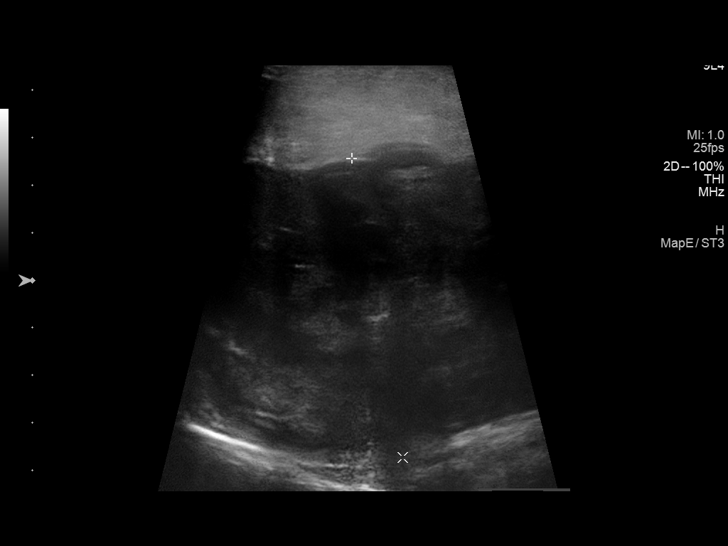
[im 23/23]
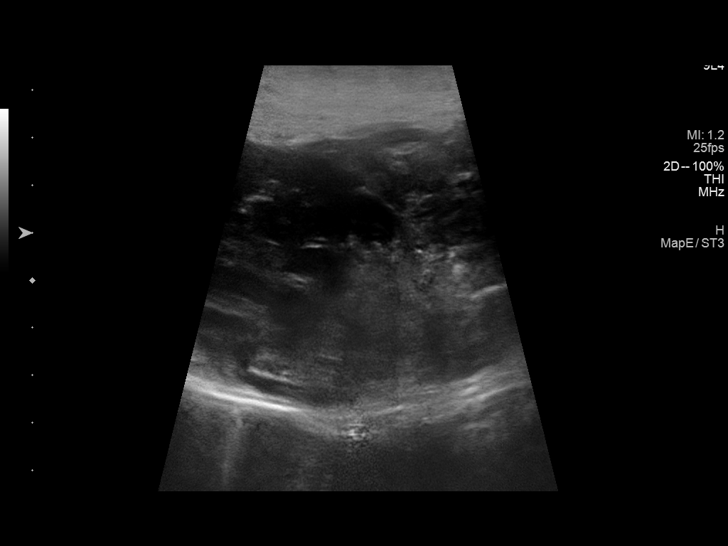

[14 of 23 positions shown; findings below may reference images not displayed]

FINDINGS: A 15.7 x 6.0 x 14.7 cm complex superficial mass is noted over the
right hip area in the region of clinical concern. Given patient's
history this may represent a large hematoma. An infectious or
malignant etiology cannot be excluded. This does not have a typical
appearance of a hernia. Minimal blood flow noted within the mass.
IMPRESSION: A 15.7 x 6.0 x 14.7 complex superficial mass is noted over the right
hip area in the region of clinical concern. Given the patient's
history this may represent a large hematoma. Other etiologies cannot
be excluded. Follow-up exam can be obtained to demonstrate
resolution.

## 2022-01-03 DIAGNOSIS — I132 Hypertensive heart and chronic kidney disease with heart failure and with stage 5 chronic kidney disease, or end stage renal disease: Secondary | ICD-10-CM | POA: Diagnosis not present

## 2022-01-03 DIAGNOSIS — M1030 Gout due to renal impairment, unspecified site: Secondary | ICD-10-CM | POA: Diagnosis not present

## 2022-01-03 DIAGNOSIS — N185 Chronic kidney disease, stage 5: Secondary | ICD-10-CM | POA: Diagnosis not present

## 2022-01-03 DIAGNOSIS — I5042 Chronic combined systolic (congestive) and diastolic (congestive) heart failure: Secondary | ICD-10-CM | POA: Diagnosis not present

## 2022-01-03 DIAGNOSIS — N2581 Secondary hyperparathyroidism of renal origin: Secondary | ICD-10-CM | POA: Diagnosis not present

## 2022-01-03 DIAGNOSIS — D631 Anemia in chronic kidney disease: Secondary | ICD-10-CM | POA: Diagnosis not present

## 2022-01-04 ENCOUNTER — Telehealth: Payer: Self-pay | Admitting: Nurse Practitioner

## 2022-01-04 ENCOUNTER — Other Ambulatory Visit (HOSPITAL_COMMUNITY): Payer: Self-pay | Admitting: *Deleted

## 2022-01-04 ENCOUNTER — Other Ambulatory Visit: Payer: Self-pay | Admitting: Nurse Practitioner

## 2022-01-04 DIAGNOSIS — Z9181 History of falling: Secondary | ICD-10-CM

## 2022-01-04 DIAGNOSIS — R1900 Intra-abdominal and pelvic swelling, mass and lump, unspecified site: Secondary | ICD-10-CM

## 2022-01-04 DIAGNOSIS — Z7901 Long term (current) use of anticoagulants: Secondary | ICD-10-CM

## 2022-01-04 NOTE — Telephone Encounter (Signed)
Called & spoke w/ pt. Provided results of [name].

## 2022-01-04 NOTE — Telephone Encounter (Signed)
Pt is wanting a call back concerning her US PELVIS LIMITED (TRANSABDOMINAL ONLY) (Accession [id number]) (Order [id number]). Pt's [phone]

## 2022-01-05 ENCOUNTER — Encounter (HOSPITAL_COMMUNITY)
Admission: RE | Admit: 2022-01-05 | Discharge: 2022-01-05 | Disposition: A | Discharge status: Home / Self Care | Payer: Medicare Other | Source: Ambulatory Visit | Attending: Nephrology | Admitting: Nephrology

## 2022-01-05 VITALS — BP 130/75 | HR 70 | Temp 97.7°F | Resp 18

## 2022-01-05 DIAGNOSIS — N185 Chronic kidney disease, stage 5: Secondary | ICD-10-CM | POA: Diagnosis not present

## 2022-01-05 LAB — POCT HEMOGLOBIN-HEMACUE
Hemoglobin: 7.2 g/dL — ABNORMAL LOW (ref 12.0–15.0)
Hemoglobin: 7.2 g/dL — ABNORMAL LOW (ref 12.0–15.0)

## 2022-01-05 MED ORDER — EPOETIN ALFA-EPBX 40000 UNIT/ML IJ SOLN
INTRAMUSCULAR | Status: AC
  Administered 2022-01-05: 20000 [IU] via SUBCUTANEOUS
  Filled 2022-01-05: qty 1

## 2022-01-05 MED ORDER — SODIUM CHLORIDE 0.9 % IV SOLN
510.0000 mg | Freq: Once | INTRAVENOUS | Status: AC
  Administered 2022-01-05: 510 mg via INTRAVENOUS
  Filled 2022-01-05: qty 510

## 2022-01-05 MED ORDER — EPOETIN ALFA-EPBX 10000 UNIT/ML IJ SOLN: 20000.0000 [IU] | INTRAMUSCULAR | Status: DC

## 2022-01-09 ENCOUNTER — Telehealth: Payer: Self-pay

## 2022-01-09 ENCOUNTER — Ambulatory Visit
Admission: RE | Admit: 2022-01-09 | Discharge: 2022-01-09 | Disposition: A | Discharge status: Home / Self Care | Payer: Medicare Other | Source: Ambulatory Visit | Attending: Nurse Practitioner | Admitting: Nurse Practitioner

## 2022-01-09 DIAGNOSIS — Z940 Kidney transplant status: Secondary | ICD-10-CM | POA: Diagnosis not present

## 2022-01-09 DIAGNOSIS — Z7901 Long term (current) use of anticoagulants: Secondary | ICD-10-CM

## 2022-01-09 DIAGNOSIS — R1900 Intra-abdominal and pelvic swelling, mass and lump, unspecified site: Secondary | ICD-10-CM

## 2022-01-09 DIAGNOSIS — M4856XA Collapsed vertebra, not elsewhere classified, lumbar region, initial encounter for fracture: Secondary | ICD-10-CM | POA: Diagnosis not present

## 2022-01-09 DIAGNOSIS — Z9181 History of falling: Secondary | ICD-10-CM

## 2022-01-09 DIAGNOSIS — S3993XA Unspecified injury of pelvis, initial encounter: Secondary | ICD-10-CM | POA: Diagnosis not present

## 2022-01-09 DIAGNOSIS — S3991XA Unspecified injury of abdomen, initial encounter: Secondary | ICD-10-CM | POA: Diagnosis not present

## 2022-01-10 ENCOUNTER — Telehealth: Payer: Medicare Other

## 2022-01-10 ENCOUNTER — Telehealth: Payer: Self-pay | Admitting: Nurse Practitioner

## 2022-01-10 DIAGNOSIS — D631 Anemia in chronic kidney disease: Secondary | ICD-10-CM | POA: Diagnosis not present

## 2022-01-10 DIAGNOSIS — M1030 Gout due to renal impairment, unspecified site: Secondary | ICD-10-CM | POA: Diagnosis not present

## 2022-01-10 DIAGNOSIS — N185 Chronic kidney disease, stage 5: Secondary | ICD-10-CM | POA: Diagnosis not present

## 2022-01-10 DIAGNOSIS — N2581 Secondary hyperparathyroidism of renal origin: Secondary | ICD-10-CM | POA: Diagnosis not present

## 2022-01-10 DIAGNOSIS — I132 Hypertensive heart and chronic kidney disease with heart failure and with stage 5 chronic kidney disease, or end stage renal disease: Secondary | ICD-10-CM | POA: Diagnosis not present

## 2022-01-10 DIAGNOSIS — I5042 Chronic combined systolic (congestive) and diastolic (congestive) heart failure: Secondary | ICD-10-CM | POA: Diagnosis not present

## 2022-01-10 NOTE — Telephone Encounter (Signed)
Called to discuss CT results and recommendations. Mr. Ertel informed me about [name]'s appt tomorrow at 10:20am.  He agreed to discussed CT results during appt tomorrow. He also wants to me to address Ms. Carbon ability to resume driving. With persistent generalized weakness, he is concerned she will be a danger to herself and others.

## 2022-01-11 ENCOUNTER — Ambulatory Visit (INDEPENDENT_AMBULATORY_CARE_PROVIDER_SITE_OTHER): Payer: Medicare Other | Admitting: Nurse Practitioner

## 2022-01-11 ENCOUNTER — Encounter: Payer: Self-pay | Admitting: Nurse Practitioner

## 2022-01-11 ENCOUNTER — Ambulatory Visit (INDEPENDENT_AMBULATORY_CARE_PROVIDER_SITE_OTHER): Payer: Medicare Other | Admitting: Psychology

## 2022-01-11 VITALS — BP 122/72 | HR 69 | Temp 97.5°F | Ht 65.5 in | Wt 105.0 lb

## 2022-01-11 DIAGNOSIS — T148XXA Other injury of unspecified body region, initial encounter: Secondary | ICD-10-CM

## 2022-01-11 DIAGNOSIS — S7011XA Contusion of right thigh, initial encounter: Secondary | ICD-10-CM

## 2022-01-11 DIAGNOSIS — S301XXA Contusion of abdominal wall, initial encounter: Secondary | ICD-10-CM

## 2022-01-11 DIAGNOSIS — Z9181 History of falling: Secondary | ICD-10-CM | POA: Diagnosis not present

## 2022-01-11 DIAGNOSIS — J8410 Pulmonary fibrosis, unspecified: Secondary | ICD-10-CM

## 2022-01-11 DIAGNOSIS — F331 Major depressive disorder, recurrent, moderate: Secondary | ICD-10-CM

## 2022-01-11 NOTE — Assessment & Plan Note (Signed)
Persistent SOB and fatigue with minimal activity. Followed by pulmonology: Dr. Elsworth Soho

## 2022-01-11 NOTE — Assessment & Plan Note (Signed)
Fall on 12/21/2021 (getting out of SUV, fell on right hip). Had another fall before 12/21/2021 (fell against sink while getting out of shower). eval in office on 6/12 due to RLQ ABD swelling and bruise of R. Upper leg. Pelvic US 01/03/22: 15.7 x 6.0 x 14.7 complex superficial mass is noted over the right hip area in the region of clinical concern. CT ABD/pelvis 01/09/22: Large complex hyperdense collection within the superficial soft tissues overlying the right hip and right iliac region anteriorly measuring up to 12.6 cm. hgb trending down in last 2weeks: 9.6 to 7.2 (GI bleed vs hematoma from trauma?) Current use of eliquis daily  Pending recommendation from cardiology and hematology regarding holding eliquis and blood transfusion?

## 2022-01-11 NOTE — Progress Notes (Signed)
Established Patient Visit  Patient: [name]   DOB: 1946-02-07   76 y.o. Female  MRN: [id number] Visit Date: 01/11/2022  Subjective:    Chief Complaint  Patient presents with   Office Visit    F/u on CT Scan 01/09/2022 Pt hasn't been eating as much    HPI Health Care Team:  Dr. Alva-Pulmonology Dr. Missy Sabins and Dr. Turner-Cardiology Dr. Kale-Hematology Palliative Care Dr. Hung-Gastroentorlogy Harrie Jeans, NP - [facility]  Postinflammatory pulmonary fibrosis [facility]) Persistent SOB and fatigue with minimal activity. Followed by pulmonology: Dr. Elsworth Soho  Hematoma Fall on 12/21/2021 (getting out of SUV, fell on right hip). Had another fall before 12/21/2021 (fell against sink while getting out of shower). eval in office on 6/12 due to RLQ ABD swelling and bruise of R. Upper leg. Pelvic US 01/03/22: 15.7 x 6.0 x 14.7 complex superficial mass is noted over the right hip area in the region of clinical concern. CT ABD/pelvis 01/09/22: Large complex hyperdense collection within the superficial soft tissues overlying the right hip and right iliac region anteriorly measuring up to 12.6 cm. hgb trending down in last 2weeks: 9.6 to 7.2 (GI bleed vs hematoma from trauma?) Current use of eliquis daily  Pending recommendation from cardiology and hematology regarding holding eliquis and blood transfusion?  BP Readings from Last 3 Encounters:  01/11/22 122/72  01/05/22 130/75  12/31/21 129/79    Wt Readings from Last 3 Encounters:  01/11/22 105 lb (47.6 kg)  12/25/21 113 lb 3.2 oz (51.3 kg)  11/29/21 109 lb 3.2 oz (49.5 kg)    Reviewed medical, surgical, and social history today  Medications: Outpatient Medications Prior to Visit  Medication Sig   acetaminophen (TYLENOL) 500 MG tablet Take 500 mg by mouth every 6 (six) hours as needed for mild pain or headache.    albuterol (PROVENTIL) (2.5 MG/3ML) 0.083% nebulizer solution Take 3 mLs (2.5 mg total) by  nebulization every 6 (six) hours as needed for wheezing or shortness of breath. Dx:J45.909   albuterol (VENTOLIN HFA) 108 (90 Base) MCG/ACT inhaler INHALE 1-2 PUFFS BY MOUTH EVERY 6 HOURS AS NEEDED FOR WHEEZE OR SHORTNESS OF BREATH   apixaban (ELIQUIS) 2.5 MG TABS tablet Take 1 tablet (2.5 mg total) by mouth 2 (two) times daily.   carvedilol (COREG) 3.125 MG tablet Take 1 tablet (3.125 mg total) by mouth 2 (two) times daily with a meal.   docusate sodium (COLACE) 100 MG capsule Take 100 mg by mouth daily.   FLUoxetine (PROZAC) 20 MG capsule Take 1 capsule (20 mg total) by mouth daily.   furosemide (LASIX) 80 MG tablet Take 80 mg by mouth 3 (three) times a week.   HYDROcodone-acetaminophen (NORCO/VICODIN) 5-325 MG tablet Take 1 tablet by mouth every 6 (six) hours as needed.   lactulose (CHRONULAC) 10 GM/15ML solution Take 30 mLs (20 g total) by mouth daily as needed for mild constipation or severe constipation.   mycophenolate (MYFORTIC) 180 MG EC tablet Take 180 mg by mouth 2 (two) times daily.   ondansetron (ZOFRAN) 4 MG tablet Take 1 tablet (4 mg total) by mouth every 8 (eight) hours as needed for up to 9 doses for nausea.   pantoprazole (PROTONIX) 20 MG tablet Take 1 tablet (20 mg total) by mouth daily.   potassium chloride (MICRO-K) 10 MEQ CR capsule Take 10 mEq by mouth 3 (three) times a week.   predniSONE (DELTASONE) 5 MG  tablet Take 5 mg by mouth daily.   tacrolimus (PROGRAF) 1 MG capsule Take 1 mg by mouth 2 (two) times daily.   atorvastatin (LIPITOR) 20 MG tablet Take 1 tablet (20 mg total) by mouth daily. (Patient not taking: Reported on 01/11/2022)   budesonide-formoterol (SYMBICORT) 160-4.5 MCG/ACT inhaler Inhale 2 puffs into the lungs 2 (two) times daily. (Patient not taking: Reported on 01/11/2022)   No facility-administered medications prior to visit.   Reviewed past medical and social history.   ROS per HPI above      Objective:  BP 122/72 (BP Location: Right Arm, Patient  Position: Sitting, Cuff Size: Small)   Pulse 69   Temp (!) 97.5 F (36.4 C) (Temporal)   Ht 5' 5.5" (1.664 m)   Wt 105 lb (47.6 kg)   SpO2 99%   BMI 17.21 kg/m      Physical Exam Constitutional:      General: She is not in acute distress. Cardiovascular:     Rate and Rhythm: Normal rate.     Pulses: Normal pulses.  Pulmonary:     Effort: Pulmonary effort is normal.  Abdominal:     General: There is no distension.     Palpations: Abdomen is soft. There is mass.     Tenderness: There is no abdominal tenderness. There is no guarding.  Musculoskeletal:     Right lower leg: No edema.     Left lower leg: No edema.  Skin:    Findings: Bruising present.     Comments: Hematoma on RLQ of ABD, non tender Bruising on right upper thigh, non tender  Neurological:     Mental Status: She is alert and oriented to person, place, and time.     No results found for any visits on 01/11/22.    Assessment & Plan:    I have spent 19mns with this patient regarding history taking, documentation, review of labs, radiology, specialty note, formulating plan and discussing treatment options with patient.   Problem List Items Addressed This Visit       Respiratory   [place]inflammatory pulmonary [place]    Persistent SOB and fatigue with minimal activity. Followed by pulmonology: Dr. AElsworth Soho       Other   At high risk for falls   Hematoma - Primary    Fall on 12/21/2021 (getting out of SUV, fell on right hip). Had another fall before 12/21/2021 (fell against sink while getting out of shower). eval in office on 6/12 due to RLQ ABD swelling and bruise of R. Upper leg. Pelvic [place]: 15.7 x 6.0 x 14.7 complex superficial mass is noted over the right hip area in the region of clinical concern. CT ABD/pelvis 01/09/22: Large complex hyperdense collection within the superficial soft tissues overlying the right hip and right iliac region anteriorly measuring up to 12.6 cm. hgb trending down  in last 2weeks: 9.6 to 7.2 (GI bleed vs hematoma from trauma?) Current use of eliquis daily  Pending recommendation from cardiology and hematology regarding holding eliquis and blood transfusion?      Return if symptoms worsen or fail to improve.     CWilfred Lacy NP

## 2022-01-11 NOTE — Patient Instructions (Signed)
I will call you with response from Dr. Irene Limbo and Dr. Missy Sabins. Maintain appts with Bambi and Dr.Plovsky Please maintain small frequent meals and drink 2bottles of boost per day. You will be contacted to schedule appt with geriatric clinic.

## 2022-01-12 DIAGNOSIS — I480 Paroxysmal atrial fibrillation: Secondary | ICD-10-CM

## 2022-01-12 DIAGNOSIS — E785 Hyperlipidemia, unspecified: Secondary | ICD-10-CM

## 2022-01-12 DIAGNOSIS — F32A Depression, unspecified: Secondary | ICD-10-CM

## 2022-01-12 DIAGNOSIS — I110 Hypertensive heart disease with heart failure: Secondary | ICD-10-CM

## 2022-01-12 DIAGNOSIS — I5020 Unspecified systolic (congestive) heart failure: Secondary | ICD-10-CM | POA: Diagnosis not present

## 2022-01-12 NOTE — Progress Notes (Signed)
[facility] Counselor/Therapist Progress Note  Patient ID: [name], MRN: [id number],    Date:01/11/2022  Time Spent: 60 minutes  Treatment Type: Individual Therapy  Reported Symptoms: depression, decreased motivation  Mental Status Exam: Appearance:  Casual     Behavior: Appropriate  Motor: In wheelchair  Speech/Language:  WNL  Affect: Blunt  Mood: normal  Thought process: normal  Thought content:   WNL  Sensory/Perceptual disturbances:   WNL  Orientation: oriented to person, place, time/date, and situation  Attention: Good  Concentration: Good  Memory: Recent;   [place] of knowledge:  Good  Insight:   Fair  Judgment:  Good  Impulse Control: Good   Risk Assessment: Danger to Self:  No Self-injurious Behavior: No Danger to Others: No Duty to Warn:no Physical Aggression / Violence:No  Access to Firearms a concern: No  Gang Involvement:No   Subjective: The patient attended a face to face individual therapy session.  The patient presents as pleasant and cooperative.  The patient was in a wheelchair today but she says that she can walk with her walker.  The patient reports that she and her husband are not getting along so well and we talked about how she is handling that.  She has always done what he is wanted her to do and now that he is having to do more for her and she is not as independent that is causing some issues.  The patient wants to work towards getting more independent so that she does not have to ask him for as much help.  We talked about her continuing to work on improving and she also talked about being referred to a psychiatrist.  She wants to see how that goes however she is a little apprehensive about it.  Encouraged her to try it and see how it how it works out and I am planning to see her again in another 6 to 8 weeks.    Interventions: Cognitive Behavioral Therapy and Solution-Oriented/Positive Psychology  Diagnosis:Major depressive  disorder, recurrent episode, moderate ([place])  Plan: Client Abilities/Strengths  Intelligent, supportive family  Client Treatment Preferences  Outpatient Individual therapy  Client Statement of Needs  "I am depressed because I had a stroke"  Treatment Level  Outpatient Individual therapy  Symptoms  Decrease or loss of appetite.: No Description Entered (Status: maintained). Depressed or irritable  mood.: No Description Entered (Status: maintained). Diminished interest in or enjoyment of activities.:  No Description Entered (Status: maintained). Lack of energy.: No Description Entered (Status:  maintained). Social withdrawal.: No Description Entered (Status: maintained).  Problems Addressed  Unipolar Depression, Unipolar Depression  Goals 1. Alleviate depressive symptoms and return to previous level of effective  functioning. Objective Learn and implement behavioral strategies to overcome depression. Target Date: 2022-10-19 Frequency: Biweekly Progress: 0 Modality: individual  Related Interventions 1. Assist the client in developing skills that increase the likelihood of deriving pleasure from  behavioral activation (e.g., assertiveness skills, developing an exercise plan, less internal/more  external focus, increased social involvement); reinforce success. Objective Identify and replace thoughts and beliefs that support depression. Target Date: 2022-10-19 Frequency: Biweekly Progress: 0 Modality: individual Related Interventions 1. Conduct Cognitive-Behavioral Therapy (see Cognitive Behavior Therapy by Olevia Bowens; Overcoming Depression by Lynita Lombard al.), beginning with helping the client learn the connection among  cognition, depressive feelings, and actions. 2. Develop healthy thinking patterns and beliefs about self, others, and the world that lead to the alleviation and help prevent the relapse of  depression. Diagnosis  296.22 (Major depressive affective disorder, single episode,  moderate) - Open -  [Signifier: n/a]  Conditions For Discharge Achievement of treatment goals and objectives   Mairany Bruno G Kyliyah Stirn, LCSW                 Eliot Popper G Maribell Demeo, LCSW

## 2022-01-17 DIAGNOSIS — N2581 Secondary hyperparathyroidism of renal origin: Secondary | ICD-10-CM | POA: Diagnosis not present

## 2022-01-17 DIAGNOSIS — D631 Anemia in chronic kidney disease: Secondary | ICD-10-CM | POA: Diagnosis not present

## 2022-01-17 DIAGNOSIS — I5042 Chronic combined systolic (congestive) and diastolic (congestive) heart failure: Secondary | ICD-10-CM | POA: Diagnosis not present

## 2022-01-17 DIAGNOSIS — I132 Hypertensive heart and chronic kidney disease with heart failure and with stage 5 chronic kidney disease, or end stage renal disease: Secondary | ICD-10-CM | POA: Diagnosis not present

## 2022-01-17 DIAGNOSIS — N185 Chronic kidney disease, stage 5: Secondary | ICD-10-CM | POA: Diagnosis not present

## 2022-01-17 DIAGNOSIS — M1030 Gout due to renal impairment, unspecified site: Secondary | ICD-10-CM | POA: Diagnosis not present

## 2022-01-19 ENCOUNTER — Encounter (HOSPITAL_COMMUNITY): Payer: Medicare Other

## 2022-01-19 ENCOUNTER — Emergency Department (HOSPITAL_COMMUNITY): Payer: Medicare Other

## 2022-01-19 ENCOUNTER — Observation Stay (HOSPITAL_COMMUNITY)
Admission: EM | Admit: 2022-01-19 | Discharge: 2022-01-20 | Disposition: A | Discharge status: Home Health | Payer: Medicare Other | Attending: Internal Medicine | Admitting: Internal Medicine

## 2022-01-19 ENCOUNTER — Encounter (HOSPITAL_COMMUNITY): Payer: Self-pay

## 2022-01-19 ENCOUNTER — Other Ambulatory Visit: Payer: Self-pay

## 2022-01-19 DIAGNOSIS — T1490XA Injury, unspecified, initial encounter: Secondary | ICD-10-CM | POA: Diagnosis not present

## 2022-01-19 DIAGNOSIS — I959 Hypotension, unspecified: Secondary | ICD-10-CM | POA: Diagnosis not present

## 2022-01-19 DIAGNOSIS — I4819 Other persistent atrial fibrillation: Secondary | ICD-10-CM | POA: Diagnosis not present

## 2022-01-19 DIAGNOSIS — I480 Paroxysmal atrial fibrillation: Secondary | ICD-10-CM | POA: Diagnosis not present

## 2022-01-19 DIAGNOSIS — I5042 Chronic combined systolic (congestive) and diastolic (congestive) heart failure: Secondary | ICD-10-CM | POA: Insufficient documentation

## 2022-01-19 DIAGNOSIS — J45909 Unspecified asthma, uncomplicated: Secondary | ICD-10-CM | POA: Insufficient documentation

## 2022-01-19 DIAGNOSIS — Z96641 Presence of right artificial hip joint: Secondary | ICD-10-CM | POA: Diagnosis not present

## 2022-01-19 DIAGNOSIS — N185 Chronic kidney disease, stage 5: Secondary | ICD-10-CM | POA: Diagnosis present

## 2022-01-19 DIAGNOSIS — T148XXA Other injury of unspecified body region, initial encounter: Secondary | ICD-10-CM | POA: Diagnosis not present

## 2022-01-19 DIAGNOSIS — Z7901 Long term (current) use of anticoagulants: Secondary | ICD-10-CM | POA: Insufficient documentation

## 2022-01-19 DIAGNOSIS — Z86718 Personal history of other venous thrombosis and embolism: Secondary | ICD-10-CM | POA: Diagnosis not present

## 2022-01-19 DIAGNOSIS — N281 Cyst of kidney, acquired: Secondary | ICD-10-CM | POA: Diagnosis not present

## 2022-01-19 DIAGNOSIS — N186 End stage renal disease: Secondary | ICD-10-CM | POA: Diagnosis not present

## 2022-01-19 DIAGNOSIS — K219 Gastro-esophageal reflux disease without esophagitis: Secondary | ICD-10-CM | POA: Diagnosis not present

## 2022-01-19 DIAGNOSIS — Z8673 Personal history of transient ischemic attack (TIA), and cerebral infarction without residual deficits: Secondary | ICD-10-CM | POA: Insufficient documentation

## 2022-01-19 DIAGNOSIS — I6930 Unspecified sequelae of cerebral infarction: Secondary | ICD-10-CM

## 2022-01-19 DIAGNOSIS — D62 Acute posthemorrhagic anemia: Secondary | ICD-10-CM | POA: Diagnosis not present

## 2022-01-19 DIAGNOSIS — W19XXXA Unspecified fall, initial encounter: Secondary | ICD-10-CM | POA: Diagnosis not present

## 2022-01-19 DIAGNOSIS — I132 Hypertensive heart and chronic kidney disease with heart failure and with stage 5 chronic kidney disease, or end stage renal disease: Secondary | ICD-10-CM | POA: Diagnosis not present

## 2022-01-19 DIAGNOSIS — Z992 Dependence on renal dialysis: Secondary | ICD-10-CM | POA: Diagnosis not present

## 2022-01-19 DIAGNOSIS — Z86711 Personal history of pulmonary embolism: Secondary | ICD-10-CM | POA: Diagnosis not present

## 2022-01-19 DIAGNOSIS — D649 Anemia, unspecified: Secondary | ICD-10-CM

## 2022-01-19 DIAGNOSIS — I10 Essential (primary) hypertension: Secondary | ICD-10-CM | POA: Diagnosis not present

## 2022-01-19 DIAGNOSIS — R609 Edema, unspecified: Secondary | ICD-10-CM | POA: Diagnosis not present

## 2022-01-19 DIAGNOSIS — Z79899 Other long term (current) drug therapy: Secondary | ICD-10-CM | POA: Insufficient documentation

## 2022-01-19 DIAGNOSIS — R1084 Generalized abdominal pain: Secondary | ICD-10-CM | POA: Diagnosis not present

## 2022-01-19 DIAGNOSIS — Z940 Kidney transplant status: Secondary | ICD-10-CM | POA: Insufficient documentation

## 2022-01-19 DIAGNOSIS — R1031 Right lower quadrant pain: Secondary | ICD-10-CM | POA: Diagnosis present

## 2022-01-19 DIAGNOSIS — I5043 Acute on chronic combined systolic (congestive) and diastolic (congestive) heart failure: Secondary | ICD-10-CM | POA: Diagnosis present

## 2022-01-19 DIAGNOSIS — M7981 Nontraumatic hematoma of soft tissue: Secondary | ICD-10-CM | POA: Insufficient documentation

## 2022-01-19 LAB — COMPREHENSIVE METABOLIC PANEL
ALT: 8 U/L (ref 0–44)
AST: 16 U/L (ref 15–41)
Albumin: 2.7 g/dL — ABNORMAL LOW (ref 3.5–5.0)
Alkaline Phosphatase: 58 U/L (ref 38–126)
Anion gap: 9 (ref 5–15)
BUN: 44 mg/dL — ABNORMAL HIGH (ref 8–23)
CO2: 25 mmol/L (ref 22–32)
Calcium: 9 mg/dL (ref 8.9–10.3)
Chloride: 105 mmol/L (ref 98–111)
Creatinine, Ser: 3.14 mg/dL — ABNORMAL HIGH (ref 0.44–1.00)
GFR, Estimated: 15 mL/min — ABNORMAL LOW (ref >60)
Glucose, Bld: 138 mg/dL — ABNORMAL HIGH (ref 70–99)
Potassium: 3.6 mmol/L (ref 3.5–5.1)
Sodium: 139 mmol/L (ref 135–145)
Total Bilirubin: 0.9 mg/dL (ref 0.3–1.2)
Total Protein: 5.3 g/dL — ABNORMAL LOW (ref 6.5–8.1)

## 2022-01-19 LAB — CBC WITH DIFFERENTIAL/PLATELET
Abs Immature Granulocytes: 0 10*3/uL (ref 0.00–0.07)
Basophils Absolute: 0 10*3/uL (ref 0.0–0.1)
Basophils Relative: 0 %
Eosinophils Absolute: 0 10*3/uL (ref 0.0–0.5)
Eosinophils Relative: 0 %
HCT: 21.6 % — ABNORMAL LOW (ref 36.0–46.0)
Hemoglobin: 6.6 g/dL — CL (ref 12.0–15.0)
Lymphocytes Relative: 8 %
Lymphs Abs: 0.7 10*3/uL (ref 0.7–4.0)
MCH: 28.2 pg (ref 26.0–34.0)
MCHC: 30.6 g/dL (ref 30.0–36.0)
MCV: 92.3 fL (ref 80.0–100.0)
Monocytes Absolute: 0.4 10*3/uL (ref 0.1–1.0)
Monocytes Relative: 5 %
Neutro Abs: 7.4 10*3/uL (ref 1.7–7.7)
Neutrophils Relative %: 87 %
Platelets: 242 10*3/uL (ref 150–400)
RBC: 2.34 MIL/uL — ABNORMAL LOW (ref 3.87–5.11)
RDW: 21.1 % — ABNORMAL HIGH (ref 11.5–15.5)
WBC: 8.5 10*3/uL (ref 4.0–10.5)
nRBC: 0 % (ref 0.0–0.2)
nRBC: 0 /100 WBC

## 2022-01-19 LAB — HEMOGLOBIN AND HEMATOCRIT, BLOOD
HCT: 24.5 % — ABNORMAL LOW (ref 36.0–46.0)
HCT: 27.4 % — ABNORMAL LOW (ref 36.0–46.0)
Hemoglobin: 8 g/dL — ABNORMAL LOW (ref 12.0–15.0)
Hemoglobin: 9 g/dL — ABNORMAL LOW (ref 12.0–15.0)

## 2022-01-19 LAB — PREPARE RBC (CROSSMATCH)

## 2022-01-19 LAB — PROTIME-INR
INR: 1.4 — ABNORMAL HIGH (ref 0.8–1.2)
Prothrombin Time: 16.8 seconds — ABNORMAL HIGH (ref 11.4–15.2)

## 2022-01-19 MED ORDER — PREDNISONE 5 MG PO TABS
5.0000 mg | ORAL_TABLET | Freq: Every day | ORAL | Status: DC
  Administered 2022-01-19 – 2022-01-20 (×2): 5 mg via ORAL
  Filled 2022-01-19 (×2): qty 1

## 2022-01-19 MED ORDER — FENTANYL CITRATE PF 50 MCG/ML IJ SOSY
50.0000 ug | PREFILLED_SYRINGE | Freq: Once | INTRAMUSCULAR | Status: AC
  Administered 2022-01-19: 50 ug via INTRAVENOUS
  Filled 2022-01-19: qty 1

## 2022-01-19 MED ORDER — PANTOPRAZOLE SODIUM 20 MG PO TBEC
20.0000 mg | DELAYED_RELEASE_TABLET | Freq: Every day | ORAL | Status: DC
  Administered 2022-01-19 – 2022-01-20 (×2): 20 mg via ORAL
  Filled 2022-01-19 (×2): qty 1

## 2022-01-19 MED ORDER — ONDANSETRON HCL 4 MG/2ML IJ SOLN: 4.0000 mg | Freq: Four times a day (QID) | INTRAMUSCULAR | Status: DC | PRN

## 2022-01-19 MED ORDER — SODIUM CHLORIDE 0.9 % IV SOLN
10.0000 mL/h | Freq: Once | INTRAVENOUS | Status: AC
  Administered 2022-01-19: 10 mL/h via INTRAVENOUS

## 2022-01-19 MED ORDER — FENTANYL CITRATE PF 50 MCG/ML IJ SOSY
25.0000 ug | PREFILLED_SYRINGE | INTRAMUSCULAR | Status: DC | PRN
  Administered 2022-01-19: 50 ug via INTRAVENOUS
  Filled 2022-01-19: qty 1

## 2022-01-19 MED ORDER — TACROLIMUS 1 MG PO CAPS
1.0000 mg | ORAL_CAPSULE | Freq: Two times a day (BID) | ORAL | Status: DC
  Administered 2022-01-19 – 2022-01-20 (×2): 1 mg via ORAL
  Filled 2022-01-19 (×2): qty 1

## 2022-01-19 MED ORDER — LACTATED RINGERS IV BOLUS
1000.0000 mL | Freq: Once | INTRAVENOUS | Status: AC
  Administered 2022-01-19: 1000 mL via INTRAVENOUS

## 2022-01-19 MED ORDER — FUROSEMIDE 40 MG PO TABS
80.0000 mg | ORAL_TABLET | ORAL | Status: DC
  Administered 2022-01-19: 80 mg via ORAL
  Filled 2022-01-19: qty 2
  Filled 2022-01-19: qty 4

## 2022-01-19 MED ORDER — HYDRALAZINE HCL 20 MG/ML IJ SOLN: 10.0000 mg | Freq: Four times a day (QID) | INTRAMUSCULAR | Status: DC | PRN

## 2022-01-19 MED ORDER — HYDROMORPHONE HCL 1 MG/ML IJ SOLN
0.5000 mg | Freq: Once | INTRAMUSCULAR | Status: AC
  Administered 2022-01-19: 0.5 mg via INTRAVENOUS
  Filled 2022-01-19: qty 1

## 2022-01-19 MED ORDER — ACETAMINOPHEN 325 MG PO TABS
650.0000 mg | ORAL_TABLET | Freq: Four times a day (QID) | ORAL | Status: DC | PRN
  Administered 2022-01-20: 650 mg via ORAL
  Filled 2022-01-19: qty 2

## 2022-01-19 MED ORDER — MYCOPHENOLATE SODIUM 180 MG PO TBEC
180.0000 mg | DELAYED_RELEASE_TABLET | Freq: Two times a day (BID) | ORAL | Status: DC
  Administered 2022-01-19 – 2022-01-20 (×3): 180 mg via ORAL
  Filled 2022-01-19 (×4): qty 1

## 2022-01-19 MED ORDER — ACETAMINOPHEN 650 MG RE SUPP: 650.0000 mg | Freq: Four times a day (QID) | RECTAL | Status: DC | PRN

## 2022-01-19 MED ORDER — ONDANSETRON HCL 4 MG PO TABS: 4.0000 mg | ORAL_TABLET | Freq: Four times a day (QID) | ORAL | Status: DC | PRN

## 2022-01-19 MED ORDER — CARVEDILOL 3.125 MG PO TABS
3.1250 mg | ORAL_TABLET | Freq: Two times a day (BID) | ORAL | Status: DC
  Administered 2022-01-19 – 2022-01-20 (×3): 3.125 mg via ORAL
  Filled 2022-01-19 (×3): qty 1

## 2022-01-19 NOTE — ED Provider Notes (Signed)
[facility] EMERGENCY DEPARTMENT Provider Note   CSN: [id number] Arrival date & time: 01/19/22  0116     History  Chief Complaint  Patient presents with   Abdominal Pain    [name] is a 76 y.o. female.  Somewhat unclear history obtained from patient and paramedics.  Patient states that she fell last Friday and was seen in the ER however in the records it says that she fell on June 18.  Sounds like she was doing okay and then earlier today paramedics were called out secondary to left lower quadrant abdominal pain associate with a left hip hematoma.  Patient was found to be dizzy with his blood pressure of 70/48 that improved to 80/50 without any intervention.  Initially complaining of pain on her left side.  States it all started around 2100.  On arrival here patient states that she has pain in her right lower quadrant is new since 2100.  No new falls.  No other new trauma.  She is on Eliquis for A-fib.  On review of CT scan from June 18 it does appear that she had some large hematomas on the right thigh and right lower pelvis.   Abdominal Pain      Home Medications Prior to Admission medications   Medication Sig Start Date End Date Taking? Authorizing Provider  acetaminophen (TYLENOL) 500 MG tablet Take 500 mg by mouth every 6 (six) hours as needed for mild pain or headache.     [provider]  albuterol (PROVENTIL) (2.5 MG/3ML) 0.083% nebulizer solution Take 3 mLs (2.5 mg total) by nebulization every 6 (six) hours as needed for wheezing or shortness of breath. Dx:J45.909 06/21/21   [name], [name], NP  albuterol (VENTOLIN HFA) 108 (90 Base) MCG/ACT inhaler INHALE 1-2 PUFFS BY MOUTH EVERY 6 HOURS AS NEEDED FOR WHEEZE OR SHORTNESS OF BREATH 08/09/21   [name], [name], NP  apixaban (ELIQUIS) 2.5 MG TABS tablet Take 1 tablet (2.5 mg total) by mouth 2 (two) times daily. 11/29/21   Bensimhon, Shaune Pascal, MD  atorvastatin (LIPITOR) 20 MG tablet Take 1  tablet (20 mg total) by mouth daily. Patient not taking: Reported on 01/11/2022 11/23/21   [name], [name], NP  budesonide-formoterol [facility]) 160-4.5 MCG/ACT inhaler Inhale 2 puffs into the lungs 2 (two) times daily. Patient not taking: Reported on 01/11/2022 06/13/21   [name], MD  carvedilol (COREG) 3.125 MG tablet Take 1 tablet (3.125 mg total) by mouth 2 (two) times daily with a meal. 11/14/21   [name], [name], NP  docusate sodium (COLACE) 100 MG capsule Take 100 mg by mouth daily.    [provider]  FLUoxetine (PROZAC) 20 MG capsule Take 1 capsule (20 mg total) by mouth daily. 11/14/21   [name], [name], NP  furosemide (LASIX) 80 MG tablet Take 80 mg by mouth 3 (three) times a week.    [provider]  HYDROcodone-acetaminophen (NORCO/VICODIN) 5-325 MG tablet Take 1 tablet by mouth every 6 (six) hours as needed. 12/31/21   Dorie Rank, MD  lactulose (CHRONULAC) 10 GM/15ML solution Take 30 mLs (20 g total) by mouth daily as needed for mild constipation or severe constipation. 11/14/21   [name], [name], NP  mycophenolate (MYFORTIC) 180 MG EC tablet Take 180 mg by mouth 2 (two) times daily.    [provider]  ondansetron (ZOFRAN) 4 MG tablet Take 1 tablet (4 mg total) by mouth every 8 (eight) hours as needed for  up to 9 doses for nausea. 03/25/21   [name], MD  pantoprazole (PROTONIX) 20 MG tablet Take 1 tablet (20 mg total) by mouth daily. 11/14/21   [name], [name], NP  potassium chloride (MICRO-K) 10 MEQ CR capsule Take 10 mEq by mouth 3 (three) times a week.    [provider]  predniSONE (DELTASONE) 5 MG tablet Take 5 mg by mouth daily. 02/21/21   [provider]  tacrolimus (PROGRAF) 1 MG capsule Take 1 mg by mouth 2 (two) times daily.    [provider]      Allergies    Warfarin sodium, Ace inhibitors, Amiodarone, and Amlodipine    Review of Systems   Review of Systems  Gastrointestinal:  Positive  for abdominal pain.    Physical Exam Updated Vital Signs BP (!) 166/78   Pulse 70   Temp 97.8 F (36.6 C) (Temporal)   Resp 16   SpO2 99%  Physical Exam Vitals and nursing note reviewed.  Constitutional:      Appearance: She is well-developed.  HENT:     Head: Normocephalic and atraumatic.     Mouth/Throat:     Mouth: Mucous membranes are dry.  Cardiovascular:     Rate and Rhythm: Normal rate and regular rhythm.  Pulmonary:     Effort: No respiratory distress.     Breath sounds: No stridor.  Abdominal:     General: There is no distension.     Tenderness: There is abdominal tenderness (RLQ).     Comments: Large mass in the right inguinal area is not tender to touch or any discoloration.  No pain in left lower quadrant but does have a large approximately 14 cm diameter developing hematoma to her left anterior hip.  Musculoskeletal:        General: Deformity (Right leg is slightly shortened and externally rotated compared to the left.  Hurts with range of motion.) present.     Cervical back: Normal range of motion.  Skin:    General: Skin is warm and dry.  Neurological:     General: No focal deficit present.     Mental Status: She is alert.     ED Results / Procedures / Treatments   Labs (all labs ordered are listed, but only abnormal results are displayed) Labs Reviewed  CBC WITH DIFFERENTIAL/PLATELET - Abnormal; Notable for the following components:      Result Value   RBC 2.34 (*)    Hemoglobin 6.6 (*)    HCT 21.6 (*)    RDW 21.1 (*)    All other components within normal limits  COMPREHENSIVE METABOLIC PANEL - Abnormal; Notable for the following components:   Glucose, Bld 138 (*)    BUN 44 (*)    Creatinine, Ser 3.14 (*)    Total Protein 5.3 (*)    Albumin 2.7 (*)    GFR, Estimated 15 (*)    All other components within normal limits  PROTIME-INR - Abnormal; Notable for the following components:   Prothrombin Time 16.8 (*)    INR 1.4 (*)    All other  components within normal limits  HEMOGLOBIN AND HEMATOCRIT, BLOOD  HEMOGLOBIN AND HEMATOCRIT, BLOOD  HEMOGLOBIN AND HEMATOCRIT, BLOOD  HEMOGLOBIN AND HEMATOCRIT, BLOOD  TYPE AND SCREEN  PREPARE RBC (CROSSMATCH)    EKG None  Radiology CT ABDOMEN PELVIS WO CONTRAST  Result Date: 01/19/2022 CLINICAL DATA:  Status post trauma. EXAM: CT ABDOMEN AND PELVIS WITHOUT CONTRAST TECHNIQUE: Multidetector  CT imaging of the abdomen and pelvis was performed following the standard protocol without IV contrast. RADIATION DOSE REDUCTION: This exam was performed according to the departmental dose-optimization program which includes automated exposure control, adjustment of the mA and/or kV according to patient size and/or use of iterative reconstruction technique. COMPARISON:  January 09, 2022 FINDINGS: Lower chest: There is moderate to marked severity cardiomegaly. Mild atelectatic changes are seen within the bilateral lung bases. Hepatobiliary: No focal liver abnormality is seen. Status post cholecystectomy. No biliary dilatation. Pancreas: Unremarkable. No pancreatic ductal dilatation or surrounding inflammatory changes. Spleen: Normal in size without focal abnormality. Adrenals/Urinary Tract: Adrenal glands are unremarkable. Diffusely atrophic native kidneys are seen with diffuse renal cortical thinning. Multiple complex hemorrhagic or proteinaceous bilateral renal cysts are noted. These are stable in size and appearance when compared to the prior exam. No renal calculi or hydronephrosis is seen. Stable moderate severity left-sided perinephric inflammatory fat stranding is noted. A renal transplant is seen within the right pelvis, without hydronephrosis or renal calculi. The urinary bladder is limited in evaluation secondary to overlying streak artifact. Stomach/Bowel: There is evidence of prior gastric banding surgery. The appendix is not clearly identified. No evidence of bowel dilatation. Noninflamed diverticula are  seen throughout the large bowel. Vascular/Lymphatic: Aortic atherosclerosis. Inferior vena cava filter is noted. No enlarged abdominal or pelvic lymph nodes. Reproductive: The uterus and bilateral adnexa are markedly limited in evaluation secondary to overlying streak artifact. Other: A large, complex, heterogeneous collection is again seen within the superficial soft tissues along the anterior aspect of the right hip and right iliac region. This measures approximately 12.5 cm x 11.5 cm x 17.3 cm and is increased in size when compared to the prior study. Interval development of a similar appearing area is seen along the anterior aspect of the symphysis pubis. This measures approximately 4.7 cm x 14.8 cm x 7.1 cm, with an adjacent less distinct portion seen along the anterior aspect of the left hip. A 3.5 cm x 3.2 cm x 3.8 cm heterogeneous collection is again seen within the superficial soft tissues of the anterolateral aspect of the proximal right thigh. This is decreased in size when compared to the prior study. Diffuse anasarca is again seen. 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 There is associated streak artifact and subsequently limited evaluation of the adjacent osseous and soft tissue structures. Chronic compression fracture deformities are seen at the levels of L1 and L2 with multilevel degenerative changes noted throughout the lumbar spine. IMPRESSION: 1. Large hematoma within the superficial soft tissues along the anterior aspect of the right hip and right iliac region, increased in size when compared to the prior study. 2. Interval development of an additional large hematoma along the anterior aspect of the symphysis pubis and left hip. 3. Interval decrease in size of the hematoma seen within the superficial soft tissues of the anterolateral aspect of the proximal right thigh on the prior exam. 4. Moderate to marked severity cardiomegaly. 5. Colonic diverticulosis. 6. Stable bilateral  hemorrhagic and/or proteinaceous renal cysts. MRI correlation is recommended, as an underlying neoplastic process cannot be excluded. 7. Chronic compression fracture deformities at the levels of L1 and L2 with multilevel degenerative changes throughout the lumbar spine. 8. Right hip replacement with streak artifact and limited evaluation of the adjacent osseous and soft tissue structures. Aortic Atherosclerosis (ICD10-I70.0). Electronically Signed   By: Virgina Norfolk M.D.   On: 01/19/2022 03:12   DG Chest Portable 1 View  Result Date: 01/19/2022 CLINICAL DATA:  Low blood pressure. EXAM: PORTABLE CHEST 1 VIEW COMPARISON:  Radiograph 06/30/2021 FINDINGS: Single lead left-sided pacemaker in place. Atrial occlusion device in place. Mild cardiomegaly. Aortic tortuosity. No pulmonary edema or confluent airspace disease. No pleural effusion or pneumothorax. Gastric band in the upper abdomen. The bones are under mineralized IMPRESSION: Mild cardiomegaly without congestive failure or acute pulmonary process. Electronically Signed   By: Keith Rake M.D.   On: 01/19/2022 02:09    Procedures Angiocath insertion  Date/Time: 01/19/2022 2:07 AM  Performed by: Merrily Pew, MD Authorized by: Merrily Pew, MD  Consent: Verbal consent obtained. Risks and benefits: risks, benefits and alternatives were discussed Consent given by: patient Patient understanding: patient states understanding of the procedure being performed Patient identity confirmed: verbally with patient Preparation: Patient was prepped and draped in the usual sterile fashion. Local anesthesia used: no  Anesthesia: Local anesthesia used: no  Sedation: Patient sedated: no  Patient tolerance: patient tolerated the procedure well with no immediate complications Comments: 18 gauge in superficial right upper arm just proximal to the AC fossa, good blood return, flushes easily without obvious extravasation   .Critical Care  Performed  by: Merrily Pew, MD Authorized by: Merrily Pew, MD   Critical care provider statement:    Critical care time (minutes):  30   Critical care was necessary to treat or prevent imminent or life-threatening deterioration of the following conditions:  Shock and circulatory failure   Critical care was time spent personally by me on the following activities:  Development of treatment plan with patient or surrogate, discussions with consultants, evaluation of patient's response to treatment, examination of patient, ordering and review of laboratory studies, ordering and review of radiographic studies, ordering and performing treatments and interventions, pulse oximetry, re-evaluation of patient's condition and review of old charts   Medications Ordered in ED Medications  fentaNYL (SUBLIMAZE) injection 25-50 mcg (50 mcg Intravenous Given 01/19/22 0724)  carvedilol (COREG) tablet 3.125 mg (3.125 mg Oral Given 01/19/22 0725)  furosemide (LASIX) tablet 80 mg (has no administration in time range)  mycophenolate (MYFORTIC) EC tablet 180 mg (has no administration in time range)  predniSONE (DELTASONE) tablet 5 mg (has no administration in time range)  tacrolimus (PROGRAF) capsule 1 mg (has no administration in time range)  pantoprazole (PROTONIX) EC tablet 20 mg (has no administration in time range)  ondansetron (ZOFRAN) tablet 4 mg (has no administration in time range)    Or  ondansetron (ZOFRAN) injection 4 mg (has no administration in time range)  acetaminophen (TYLENOL) tablet 650 mg (has no administration in time range)    Or  acetaminophen (TYLENOL) suppository 650 mg (has no administration in time range)  HYDROmorphone (DILAUDID) injection 0.5 mg (0.5 mg Intravenous Given 01/19/22 0217)  lactated ringers bolus 1,000 mL (0 mLs Intravenous Stopped 01/19/22 0441)  0.9 %  sodium chloride infusion (10 mL/hr Intravenous New Bag/Given 01/19/22 0401)  fentaNYL (SUBLIMAZE) injection 50 mcg (50 mcg Intravenous  Given 01/19/22 0443)    ED Course/ Medical Decision Making/ A&P                           Medical Decision Making Amount and/or Complexity of Data Reviewed Labs: ordered. Radiology: ordered. ECG/medicine tests: ordered.  Risk Prescription drug management. Decision regarding hospitalization.  Patient with n known hematoma on the right soft tissue of her inguinal area that does not seem to be tender or have any abnormal  discoloration but does have pain in that right hip concerning for possible hip fracture although no new falls.  She has a new hematoma to the left which does not make a lot of sense without any new trauma so we will get CT scan to evaluate these.  We will going give her some pain medicine and some fluids and will obtain IV access.  She has a history of CHF and needs to be a little bit cognizant of that.  CT with mild worsening of right-sided hematoma but otherwise had hematoma seems to be improving but has a new large hematoma in the left thigh. (reviewed and independently interpreted by myself and radiology read reviewed as well).  I discussed this with the radiologist as well.  Patient's hemoglobin was 6.6.  It is multifactorial.  Her most recent globin here in the ER was 7.2 bulimic her baseline was 9.69.5 on previous labs.  Likely related to her chronic kidney disease.  Without any recent trauma and the spontaneous large hematoma and decreased hemoglobin on Eliquis I discussed with pharmacy about whether or not this needs to be reversed.  Since her blood pressure including with fluids and there is no obvious life-threatening bleed at this time we will hold on reversal.  Pharmacist is concerned for a significantly supratherapeutic level of Eliquis related to her kidneys, small body weight and suggest holding it for now possibly discussed with hematology to start a new one when this washes out.  Discussed with Dr. Alcario Drought was hospitalist for admission.  He agrees to admission and  request surgical discussion to ensure that there is no surgical options to explore for any new sources of bleed.  I discussed with Dr. Barry Dienes who did not see any surgical intervention be necessary and can likely make things worse.  She felt just let self tamponade while working on her coagulation issues was most appropriate.  Did not feel binders were necessary.  Dr. Alcario Drought aware and will admit.  Family and patient updated.   Final Clinical Impression(s) / ED Diagnoses Final diagnoses:  Anemia, unspecified type  Hematoma    Rx / DC Orders ED Discharge Orders     None         Faizon Capozzi, Corene Cornea, MD 01/19/22 0730

## 2022-01-19 NOTE — H&P (Signed)
History and Physical    Patient: [name] QJF:[id number] DOB: 12/05/1945 DOA: 01/19/2022 DOS: the patient was seen and examined on 01/19/2022 PCP: [name], NP  Patient coming from: Home  Chief Complaint:  Chief Complaint  Patient presents with   Abdominal Pain   HPI: [name] is a 76 y.o. female with medical history significant of CKD 5 s/p renal transplant in 2017; A.Fib s/p watchmans procedure, AV node ablation, and PPM; despite Watchman's procedure pt had stroke in Jan of this year prompting them to put her back on eliquis; h/o GIB; CHF follows with Dr. Haroldine Laws.  Since the stroke, it sounds like she has had more frequent falls.  She was seen last month on June 18th for a fall in the ED.  Had hematoma of R abd wall at that time.  Hematoma appears to be stable on repeat CT on 6/27.  Second fall last Friday.  No hematoma at that time per pt, not seen in ED then.  However, developed onset of LLQ  Abd pain and hematoma earlier this evening.  Symptoms onset around 2100.  EMS called out to house for this and dizziness.  Initial BP at house was 89H systolic which improved to 80s without intervention.  In Ed: Pt given 1L IVF bolus with normotension after. CT scan confirms new L abd wall hematoma as well as a slightly improved / healing R abd wall hematoma.  HGB 6.6 in ED down from 7.2 last month (looks like baseline before the falls and hematomas was 9.5-11 range).  2u PRBC transfusion ordered and ongoing at this time.    Review of Systems: As mentioned in the history of present illness. All other systems reviewed and are negative. Past Medical History:  Diagnosis Date   Acute diastolic (congestive) heart failure (HCC) 08/12/2017   Acute GI bleeding 11/14/2019   Acute kidney injury superimposed on chronic kidney disease ([place]) 11/17/2017   AKI (acute kidney injury) ([place]) 11/04/2017   Anemia    Anxiety    ARF (acute renal failure) ([place]) 06/06/2011   Arthritis     "shoulders; arms; hips" (02/04/2018)   Asthma    Atrial fibrillation (HCC)    Atrial flutter ([place])    s/p aflutter ablation [facility]   Bacteriuria, asymptomatic 11/14/2020   Benign hypertension with ESRD (end-stage renal disease) ([place])    Blood transfusion    never had a reaction to blood transfusion   Cardiomyopathy Mar 2013   Mild, EF 50-55% by Mar 2013 ECHO, diast dysfxn II   Cardiomyopathy ([place])    CHF (congestive heart failure) ([place]) 2005   CHF (congestive heart failure) ([place])    Childhood asthma    Closed fracture of right distal radius 10/18/2016   CMV (cytomegalovirus infection) ([place]) 10/03/2015   Constipation    takes Miralax daily   Constipation    Depression    takes Zoloft daily   Distal radius fracture, right    DVT of lower extremity, bilateral ([place]) 12/21/11   "they're there now; been there for 2 wks"   Eczema    End stage renal disease ([place]) 11/06/2011   ESRD (end stage renal disease) on dialysis [facility]) 09/2011   07-19-2015 had Kidney transplant at [facility]; "don't get dialysis anymore" (02/04/2018)   Essential hypertension 07/07/2007   Qualifier: Diagnosis of  By: Garen Grams     Fractures, stress    in both feet--6 OR 7 YRS AGO--RESOLVED   Generalized edema [id number]  GI bleed 11/14/2019   Gout    doesn't require meds    HCAP (healthcare-associated pneumonia) 09/10/2011   Hearing difficulty    Hematoma of left lower leg 05/17/2020   High cholesterol    History of CVA (cerebrovascular accident) without residual deficits    History of hip replacement, total, right    History of pneumonia    Hx of Clostridium difficile infection    Hypercalcemia    09/10/11   Hyperparathyroidism ([place]) 04/07/2013   Hypertension    takes Diltiazem daily    Hypomagnesemia 08/02/2015   Hypophosphatemia 11/08/2017   ICB (intracranial bleed) ([place])    Immunosuppression ([place]) 07/25/2015   Memory changes    Morbid obesity ([place])    Nonischemic dilated cardiomyopathy ([place])    Obesity  04/07/2013   Oligouria    Oral mucositis 02/22/2018   OSA on CPAP    PAF (paroxysmal atrial fibrillation) (HCC)     HX OF CEREBRAL BLEED WHILE ON COUMADIN-SO PT NOT ON ANY BLOOD THINNERS NOW   Peripheral vascular disease ([place])    Persistent atrial fibrillation ([place]) 08/12/2017   Overview:  Added automatically from request for surgery [id number]  Formatting of this note might be different from the original. Added automatically from request for surgery [id number]   Presence of Watchman left atrial appendage closure device 10/04/2017   Proteinuria 09/04/2016   Psoriasis 08/07/2016   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/25/2015   S/P insertion of IVC (inferior vena caval) filter    Sepsis ([place]) 11/08/2017   Stress fracture    bilateral feet   Stroke ([place]) 2009   denies residual on 02/04/2018;  hemorrhagic now off coumadin   Stroke ([place]) 07/17/2007   denies residual, hemorrhagic now off coumadin   Stroke due to intracerebral hemorrhage ([place]) 2009   Suture reaction 09/04/2016   Transfusion history    Use of cane as ambulatory aid    Past Surgical History:  Procedure Laterality Date   AV FISTULA PLACEMENT  09/28/2011   Procedure: ARTERIOVENOUS (AV) FISTULA CREATION;  Surgeon: Rosetta Posner, MD;  Location: [facility];  Service: Vascular;  Laterality: Left;   BIOPSY  07/10/2021   Procedure: BIOPSY;  Surgeon: Irving Copas., MD;  Location: [facility];  Service: Gastroenterology;;   CARDIAC CATHETERIZATION     CARDIAC VALVE [place] DEFECT   CHOLECYSTECTOMY     1980's   COLONOSCOPY     COLONOSCOPY WITH PROPOFOL N/A 11/16/2019   Procedure: COLONOSCOPY WITH PROPOFOL;  Surgeon: Juanita Craver, MD;  Location: [facility] ENDOSCOPY;  Service: Endoscopy;  Laterality: N/A;   CYSTOSCOPY     many yrs ago   ESOPHAGOGASTRODUODENOSCOPY N/A 07/10/2021   Procedure: ESOPHAGOGASTRODUODENOSCOPY (EGD);  Surgeon: Irving Copas., MD;  Location: [facility];  Service: Gastroenterology;  Laterality:  N/A;   ESOPHAGOGASTRODUODENOSCOPY (EGD) WITH PROPOFOL N/A 11/16/2019   Procedure: ESOPHAGOGASTRODUODENOSCOPY (EGD) WITH PROPOFOL;  Surgeon: Juanita Craver, MD;  Location: [facility] ENDOSCOPY;  Service: Endoscopy;  Laterality: N/A;   ESOPHAGOGASTRODUODENOSCOPY (EGD) WITH PROPOFOL N/A 03/20/2021   Procedure: ESOPHAGOGASTRODUODENOSCOPY (EGD) WITH PROPOFOL;  Surgeon: Milus Banister, MD;  Location: [facility] ENDOSCOPY;  Service: Endoscopy;  Laterality: N/A;   FEMUR FRACTURE SURGERY Right 03/2011; 09/03/2011   "had 2, 2 wk apart in 2012; broke it again 08/2011 & had OR"   [place] N/A 07/10/2021   Procedure: FLEXIBLE SIGMOIDOSCOPY;  Surgeon: Rush Landmark Telford Nab., MD;  Location: [facility];  Service: Gastroenterology;  Laterality: N/A;   FRACTURE  SURGERY     GIVENS CAPSULE STUDY N/A 07/11/2021   Procedure: GIVENS CAPSULE STUDY;  Surgeon: Juanita Craver, MD;  Location: [facility] ENDOSCOPY;  Service: Endoscopy;  Laterality: N/A;   HEMOSTASIS CONTROL  03/20/2021   Procedure: HEMOSTASIS CONTROL;  Surgeon: Milus Banister, MD;  Location: [facility];  Service: Endoscopy;;   HOT HEMOSTASIS N/A 03/20/2021   Procedure: HOT HEMOSTASIS (ARGON PLASMA COAGULATION/BICAP);  Surgeon: Milus Banister, MD;  Location: [facility] ENDOSCOPY;  Service: Endoscopy;  Laterality: N/A;   HOT HEMOSTASIS N/A 07/10/2021   Procedure: HOT HEMOSTASIS (ARGON PLASMA COAGULATION/BICAP);  Surgeon: Irving Copas., MD;  Location: [facility];  Service: Gastroenterology;  Laterality: N/A;   I & D EXTREMITY  09/15/2011   Procedure: IRRIGATION AND DEBRIDEMENT EXTREMITY w REMOVAL OF HARDWARE;  Surgeon: [name], MD;  Location: [facility];  Service: Orthopedics;  Laterality: Right;   INCISION AND DRAINAGE HIP  12/21/2011   Procedure: IRRIGATION AND DEBRIDEMENT HIP;  Surgeon: [name], MD;  Location: [facility];  Service: Orthopedics;  Laterality: Right;  I&D RIGHT HIP WITH PLACEMENT ANTIBIOTIC SPACER   INSERTION OF DIALYSIS CATHETER     Procedure:  INSERTION OF DIALYSIS CATHETER;  Surgeon: Rosetta Posner, MD;  Location: [facility];  Service: Vascular;  Laterality: Right;   IRRIGATION AND DEBRIDEMENT ABSCESS  12/21/11   right hip   JOINT REPLACEMENT     KIDNEY TRANSPLANT  07/19/15   LAPAROSCOPIC GASTRIC BANDING  2008   LEFT ATRIAL APPENDAGE OCCLUSION  10/04/2017   OPEN REDUCTION INTERNAL FIXATION (ORIF) DISTAL RADIAL FRACTURE Right 10/18/2016   Procedure: OPEN REDUCTION INTERNAL FIXATION (ORIF) RIGHT DISTAL RADIAL FRACTURE;  Surgeon: Marchia Bond, MD;  Location: [facility];  Service: Orthopedics;  Laterality: Right;   SUBMUCOSAL TATTOO INJECTION  07/10/2021   Procedure: SUBMUCOSAL TATTOO INJECTION;  Surgeon: Irving Copas., MD;  Location: [facility];  Service: Gastroenterology;;   TOTAL HIP ARTHROPLASTY Right 02/2011   right THA 02/2011, I&D/removal of hardware 09/2011,, repeat I&D Jun 2013, reimplantation R THA 03-26-2012   TOTAL HIP REVISION  03/25/2012   Procedure: TOTAL HIP REVISION;  Surgeon: [name], MD;  Location: [facility];  Service: Orthopedics;  Laterality: Right;  Right Total Hip Reimplantation   TUBAL LIGATION     VENA CAVA FILTER PLACEMENT  11/2011   Social History:  reports that she has never smoked. She has never used smokeless tobacco. She reports that she does not drink alcohol and does not use drugs.  Allergies  Allergen Reactions   Warfarin Sodium Other (See Comments)    Caused her to have a stroke/left side of brain to hemorrhage    Ace Inhibitors Cough   Amiodarone Other (See Comments)    Pt with Restrictive Lung Disease by 10/2017 PFT with decreased DLCO   Amlodipine Swelling    Swelling in feet    Family History  Problem Relation Age of Onset   Cancer Father    Diabetes Mother    Hypertension Mother    Arthritis Mother    Hodgkin's lymphoma Other 52       dscd---HODGKINS DISEASE   Hypertension Brother     Prior to Admission medications   Medication Sig Start Date End Date Taking?  Authorizing Provider  acetaminophen (TYLENOL) 500 MG tablet Take 500 mg by mouth every 6 (six) hours as needed for mild pain or headache.     [provider]  albuterol (PROVENTIL) (2.5 MG/3ML) 0.083% nebulizer solution Take 3 mLs (  2.5 mg total) by nebulization every 6 (six) hours as needed for wheezing or shortness of breath. Dx:J45.909 06/21/21   [name], [name], NP  albuterol (VENTOLIN HFA) 108 (90 Base) MCG/ACT inhaler INHALE 1-2 PUFFS BY MOUTH EVERY 6 HOURS AS NEEDED FOR WHEEZE OR SHORTNESS OF BREATH 08/09/21   [name], [name], NP  apixaban (ELIQUIS) 2.5 MG TABS tablet Take 1 tablet (2.5 mg total) by mouth 2 (two) times daily. 11/29/21   Bensimhon, Shaune Pascal, MD  atorvastatin (LIPITOR) 20 MG tablet Take 1 tablet (20 mg total) by mouth daily. Patient not taking: Reported on 01/11/2022 11/23/21   [name], [name], NP  budesonide-formoterol [facility]) 160-4.5 MCG/ACT inhaler Inhale 2 puffs into the lungs 2 (two) times daily. Patient not taking: Reported on 01/11/2022 06/13/21   [name], MD  carvedilol (COREG) 3.125 MG tablet Take 1 tablet (3.125 mg total) by mouth 2 (two) times daily with a meal. 11/14/21   [name], [name], NP  docusate sodium (COLACE) 100 MG capsule Take 100 mg by mouth daily.    [provider]  FLUoxetine (PROZAC) 20 MG capsule Take 1 capsule (20 mg total) by mouth daily. 11/14/21   [name], [name], NP  furosemide (LASIX) 80 MG tablet Take 80 mg by mouth 3 (three) times a week.    [provider]  HYDROcodone-acetaminophen (NORCO/VICODIN) 5-325 MG tablet Take 1 tablet by mouth every 6 (six) hours as needed. 12/31/21   Dorie Rank, MD  lactulose (CHRONULAC) 10 GM/15ML solution Take 30 mLs (20 g total) by mouth daily as needed for mild constipation or severe constipation. 11/14/21   [name], [name], NP  mycophenolate (MYFORTIC) 180 MG EC tablet Take 180 mg by mouth 2 (two) times daily.    [provider]  ondansetron (ZOFRAN) 4  MG tablet Take 1 tablet (4 mg total) by mouth every 8 (eight) hours as needed for up to 9 doses for nausea. 03/25/21   [name], MD  pantoprazole (PROTONIX) 20 MG tablet Take 1 tablet (20 mg total) by mouth daily. 11/14/21   [name], [name], NP  potassium chloride (MICRO-K) 10 MEQ CR capsule Take 10 mEq by mouth 3 (three) times a week.    [provider]  predniSONE (DELTASONE) 5 MG tablet Take 5 mg by mouth daily. 02/21/21   [provider]  tacrolimus (PROGRAF) 1 MG capsule Take 1 mg by mouth 2 (two) times daily.    [provider]    Physical Exam: Vitals:   01/19/22 0400 01/19/22 0413 01/19/22 0430 01/19/22 0545  BP: 114/61 126/69 136/67 (!) 160/76  Pulse: 73 73 71   Resp: '18 18 12 14  '$ Temp: (!) 97.2 F (36.2 C) (!) 97 F (36.1 C)    TempSrc: Temporal Temporal    SpO2: 100% 100% 100%    Constitutional: NAD, calm, comfortable Eyes: PERRL, lids and conjunctivae normal ENMT: Mucous membranes are moist. Posterior pharynx clear of any exudate or lesions.Normal dentition.  Neck: normal, supple, no masses, no thyromegaly Respiratory: clear to auscultation bilaterally, no wheezing, no crackles. Normal respiratory effort. No accessory muscle use.  Cardiovascular: Regular rate and rhythm, no murmurs / rubs / gallops. No extremity edema. 2+ pedal pulses. No carotid bruits.  Abdomen: B abd wall / hip hematomas present. Musculoskeletal: no clubbing / cyanosis. No joint deformity upper and lower extremities. Good ROM, no contractures. Normal muscle tone.  Skin: no rashes, lesions, ulcers. No induration Neurologic: CN 2-12 grossly intact.  Sensation intact, DTR normal. Strength 5/5 in all 4.  Psychiatric: Normal judgment and insight. Alert and oriented x 3. Normal mood.   Data Reviewed:       Latest Ref Rng & Units 01/19/2022    2:08 AM 01/05/2022    9:30 AM 01/05/2022    9:02 AM  CBC  WBC 4.0 - 10.5 K/uL 8.5     Hemoglobin 12.0 - 15.0 g/dL 6.6  7.2   7.2   Hematocrit 36.0 - 46.0 % 21.6     Platelets 150 - 400 K/uL 242         Latest Ref Rng & Units 01/19/2022    2:08 AM 12/31/2021    7:52 PM 11/29/2021    2:54 PM  CMP  Glucose 70 - 99 mg/dL 138  145  88   BUN 8 - 23 mg/dL 44  42  43   Creatinine 0.44 - 1.00 mg/dL 3.14  3.37  3.09   Sodium 135 - 145 mmol/L 139  141  137   Potassium 3.5 - 5.1 mmol/L 3.6  4.4  4.3   Chloride 98 - 111 mmol/L 105  107  109   CO2 22 - 32 mmol/L '25  25  17   '$ Calcium 8.9 - 10.3 mg/dL 9.0  9.7  9.3   Total Protein 6.5 - 8.1 g/dL 5.3     Total Bilirubin 0.3 - 1.2 mg/dL 0.9     Alkaline Phos 38 - 126 U/L 58     AST 15 - 41 U/L 16     ALT 0 - 44 U/L 8      IMPRESSION: 1. Large hematoma within the superficial soft tissues along the anterior aspect of the right hip and right iliac region, increased in size when compared to the prior study. 2. Interval development of an additional large hematoma along the anterior aspect of the symphysis pubis and left hip. 3. Interval decrease in size of the hematoma seen within the superficial soft tissues of the anterolateral aspect of the proximal right thigh on the prior exam. 4. Moderate to marked severity cardiomegaly. 5. Colonic diverticulosis. 6. Stable bilateral hemorrhagic and/or proteinaceous renal cysts. MRI correlation is recommended, as an underlying neoplastic process cannot be excluded. 7. Chronic compression fracture deformities at the levels of L1 and L2 with multilevel degenerative changes throughout the lumbar spine. 8. Right hip replacement with streak artifact and limited evaluation of the adjacent osseous and soft tissue structures.  Assessment and Plan: * ABLA (acute blood loss anemia) Due to abdominal wall hematomas.  One somewhat chronic (occurred after fall last month), and one acute today per patient (though fall for that occurred 1 week ago apparently). Presumably exacerbated in setting of chronic eliquis use. Hold eliquis 2u PRBC  transfusion H/H Q6H INR of 1.4 currently. Platelets are normal (240) Not sure what to make of the reported schistocytes on smear today, but pt doesn't have thrombocytopenia (so not DIC, not TTP). EDP spoke with General surgery: Nothing to do surgically Bleed will tamponade itself off Dont think abd binder will be of any benefit either.  Chronic combined systolic and diastolic CHF (congestive heart failure) ([place]) Cont home meds for now including lasix.  Getting fluids in form of PRBC transfusion. Watch for volume overload post transfusion.  CKD (chronic kidney disease), stage V ([place]) S/p renal transplant. Creat today of 3.1 looks to be c/w her baseline over last several months. Continue prednisone, prograf, and myfortic.  Med rec pending, but  will order the doses in computer system which pt tells me are likely correct.  Dont want her to risk missing these completely.  Atrial fibrillation s/p AVN ablation/PPM implant, s/p LAAC with [employer] eliquis in setting of acute abdominal wall hematoma with ABLA. Sounds like eliquis was restarted in Jan following acute CVA despite h/o Watchman procedure. Will need to defer to day team + cards in follow up (Dr. Haroldine Laws patient) when / if to restart eliquis in future.  Bilateral renal cysts Seen on CT this past month and again today. B hemorrhagic cysts / masses cant be ruled out. Patient likely needs follow up MRI of kidneys. PPM is presumably MRI conditional, just had MRI brain in Jan.  Gastroesophageal reflux disease Also h/o GI bleeds due to duodenal ulcers, erosive esophagitis, and diverticular bleeds. Continue protonix.  Personal history of DVT/PE s/p IVC filter Listed in problem list in chart, though patient and husband, actually denied this to me when I asked.  Maybe a miscommunication?      Advance Care Planning:   Code Status: Full Code  Consults: None formally, EDP spoke with general surgery Dr. Barry Dienes as  above  Family Communication: Husband at bedside  Severity of Illness: The appropriate patient status for this patient is OBSERVATION. Observation status is judged to be reasonable and necessary in order to provide the required intensity of service to ensure the patient's safety. The patient's presenting symptoms, physical exam findings, and initial radiographic and laboratory data in the context of their medical condition is felt to place them at decreased risk for further clinical deterioration. Furthermore, it is anticipated that the patient will be medically stable for discharge from the hospital within 2 midnights of admission.   Author: Etta Quill., DO 01/19/2022 5:54 AM  For on call review [email or web address].

## 2022-01-19 NOTE — Plan of Care (Signed)

## 2022-01-19 NOTE — ED Triage Notes (Signed)
Pt comes via [facility] from home, called for lower abd pain and pelvic pain, had a fall last week, bruising over the L hip, hypotensive with EMS in the 70's

## 2022-01-19 NOTE — ED Notes (Signed)
Pt receiving blood

## 2022-01-19 NOTE — Assessment & Plan Note (Addendum)
Due to abdominal wall hematomas.  One somewhat chronic (occurred after fall last month), and one acute today per patient (though fall for that occurred 1 week ago apparently). Presumably exacerbated in setting of chronic eliquis use. 1. Hold eliquis 2. 2u PRBC transfusion 3. H/H Q6H 4. INR of 1.4 currently. 5. Platelets are normal (240) 1. Not sure what to make of the reported schistocytes on smear today, but pt doesn't have thrombocytopenia (so not DIC, not TTP). 6. EDP spoke with General surgery: 1. Nothing to do surgically 2. Bleed will tamponade itself off 3. Dont think abd binder will be of any benefit either.

## 2022-01-19 NOTE — ED Notes (Signed)
ED TO INPATIENT HANDOFF REPORT   S Name/Age/Gender [name] 76 y.o. female Room/Bed: TRABC/TRABC  Code Status   Code Status: Full Code  Home/SNF/Other Home Patient oriented to: self, place, time, and situation Is this baseline? Yes   Triage Complete: Triage complete  Chief Complaint ABLA (acute blood loss anemia) [D62]  Triage Note Pt comes via [facility] from home, called for lower abd pain and pelvic pain, had a fall last week, bruising over the L hip, hypotensive with EMS in the 70's   Allergies Allergies  Allergen Reactions   Warfarin Sodium Other (See Comments)    Caused her to have a stroke/left side of brain to hemorrhage    Ace Inhibitors Cough   Amiodarone Other (See Comments)    Pt with Restrictive Lung Disease by 10/2017 PFT with decreased DLCO   Amlodipine Swelling    Swelling in feet    Level of Care/Admitting Diagnosis ED Disposition     ED Disposition  [place]: [facility] [[id number]]  Level of Care: Telemetry Medical [[id number]]  May place patient in observation at [facility] or [place] if equivalent level of care is available:: No  Covid Evaluation: Asymptomatic - no recent exposure (last 10 days) testing not required  Diagnosis: ABLA (acute blood loss anemia) [4268341]  Admitting Physician: Etta Quill [id number]  Attending Physician: Etta Quill [[id number]]          B Medical/Surgery History Past Medical History:  Diagnosis Date   Acute diastolic (congestive) heart failure ([place]) 08/12/2017   Acute GI bleeding 11/14/2019   Acute kidney injury superimposed on chronic kidney disease ([place]) 11/17/2017   AKI (acute kidney injury) ([place]) 11/04/2017   Anemia    Anxiety    ARF (acute renal failure) ([place]) 06/06/2011   Arthritis    "shoulders; arms; hips" (02/04/2018)   Asthma    Atrial fibrillation (HCC)    Atrial flutter (HCC)    s/p aflutter ablation at [facility]   Bacteriuria, asymptomatic  11/14/2020   Benign hypertension with ESRD (end-stage renal disease) ([place])    Blood transfusion    never had a reaction to blood transfusion   Cardiomyopathy Mar 2013   Mild, EF 50-55% by Mar 2013 ECHO, diast dysfxn II   Cardiomyopathy ([place])    CHF (congestive heart failure) ([place]) 2005   CHF (congestive heart failure) ([place])    Childhood asthma    Closed fracture of right distal radius 10/18/2016   CMV (cytomegalovirus infection) ([place]) 10/03/2015   Constipation    takes Miralax daily   Constipation    Depression    takes Zoloft daily   Distal radius fracture, right    DVT of lower extremity, bilateral ([place]) 12/21/11   "they're there now; been there for 2 wks"   Eczema    End stage renal disease ([place]) 11/06/2011   ESRD (end stage renal disease) on dialysis [facility]) 09/2011   07-19-2015 had Kidney transplant at [facility]; "don't get dialysis anymore" (02/04/2018)   Essential hypertension 07/07/2007   Qualifier: Diagnosis of  By: Garen Grams     Fractures, stress    in both feet--6 OR 7 YRS AGO--RESOLVED   Generalized edema [id number]   GI bleed 11/14/2019   Gout    doesn't require meds    HCAP (healthcare-associated pneumonia) 09/10/2011   Hearing difficulty    Hematoma of left lower leg 05/17/2020   High cholesterol  History of CVA (cerebrovascular accident) without residual deficits    History of hip replacement, total, right    History of pneumonia    Hx of Clostridium difficile infection    Hypercalcemia    09/10/11   Hyperparathyroidism ([place]) 04/07/2013   Hypertension    takes Diltiazem daily    Hypomagnesemia 08/02/2015   Hypophosphatemia 11/08/2017   ICB (intracranial bleed) ([place])    Immunosuppression ([place]) 07/25/2015   Memory changes    Morbid obesity ([place])    Nonischemic dilated cardiomyopathy ([place])    Obesity 04/07/2013   Oligouria    Oral mucositis 02/22/2018   OSA on CPAP    PAF (paroxysmal atrial fibrillation) (HCC)     HX OF CEREBRAL BLEED WHILE ON COUMADIN-SO PT  NOT ON ANY BLOOD THINNERS NOW   Peripheral vascular disease ([place])    Persistent atrial fibrillation ([place]) 08/12/2017   Overview:  Added automatically from request for surgery [phone]  Formatting of this note might be different from the original. Added automatically from request for surgery [id number]   Presence of Watchman left atrial appendage closure device 10/04/2017   Proteinuria 09/04/2016   Psoriasis 08/07/2016   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/25/2015   S/P insertion of IVC (inferior vena caval) filter    Sepsis ([place]) 11/08/2017   Stress fracture    bilateral feet   Stroke ([place]) 2009   denies residual on 02/04/2018;  hemorrhagic now off coumadin   Stroke ([place]) 07/17/2007   denies residual, hemorrhagic now off coumadin   Stroke due to intracerebral hemorrhage ([place]) 2009   Suture reaction 09/04/2016   Transfusion history    Use of cane as ambulatory aid    Past Surgical History:  Procedure Laterality Date   AV FISTULA PLACEMENT  09/28/2011   Procedure: ARTERIOVENOUS (AV) FISTULA CREATION;  Surgeon: Rosetta Posner, MD;  Location: [place];  Service: Vascular;  Laterality: Left;   BIOPSY  07/10/2021   Procedure: BIOPSY;  Surgeon: Irving Copas., MD;  Location: [facility];  Service: Gastroenterology;;   CARDIAC CATHETERIZATION     CARDIAC VALVE [place] DEFECT   CHOLECYSTECTOMY     1980's   COLONOSCOPY     COLONOSCOPY WITH PROPOFOL N/A 11/16/2019   Procedure: COLONOSCOPY WITH PROPOFOL;  Surgeon: Juanita Craver, MD;  Location: [facility] ENDOSCOPY;  Service: Endoscopy;  Laterality: N/A;   CYSTOSCOPY     many yrs ago   ESOPHAGOGASTRODUODENOSCOPY N/A 07/10/2021   Procedure: ESOPHAGOGASTRODUODENOSCOPY (EGD);  Surgeon: Irving Copas., MD;  Location: [facility];  Service: Gastroenterology;  Laterality: N/A;   ESOPHAGOGASTRODUODENOSCOPY (EGD) WITH PROPOFOL N/A 11/16/2019   Procedure: ESOPHAGOGASTRODUODENOSCOPY (EGD) WITH PROPOFOL;  Surgeon: Juanita Craver, MD;   Location: [facility] ENDOSCOPY;  Service: Endoscopy;  Laterality: N/A;   ESOPHAGOGASTRODUODENOSCOPY (EGD) WITH PROPOFOL N/A 03/20/2021   Procedure: ESOPHAGOGASTRODUODENOSCOPY (EGD) WITH PROPOFOL;  Surgeon: Milus Banister, MD;  Location: [facility] ENDOSCOPY;  Service: Endoscopy;  Laterality: N/A;   FEMUR FRACTURE SURGERY Right 03/2011; 09/03/2011   "had 2, 2 wk apart in 2012; broke it again 08/2011 & had OR"   [place] N/A 07/10/2021   Procedure: FLEXIBLE SIGMOIDOSCOPY;  Surgeon: Rush Landmark Telford Nab., MD;  Location: [facility];  Service: Gastroenterology;  Laterality: N/A;   FRACTURE SURGERY     GIVENS CAPSULE STUDY N/A 07/11/2021   Procedure: GIVENS CAPSULE STUDY;  Surgeon: Juanita Craver, MD;  Location: [facility] ENDOSCOPY;  Service: Endoscopy;  Laterality: N/A;   HEMOSTASIS CONTROL  03/20/2021  Procedure: HEMOSTASIS CONTROL;  Surgeon: Milus Banister, MD;  Location: [facility] ENDOSCOPY;  Service: Endoscopy;;   HOT HEMOSTASIS N/A 03/20/2021   Procedure: HOT HEMOSTASIS (ARGON PLASMA COAGULATION/BICAP);  Surgeon: Milus Banister, MD;  Location: [facility] ENDOSCOPY;  Service: Endoscopy;  Laterality: N/A;   HOT HEMOSTASIS N/A 07/10/2021   Procedure: HOT HEMOSTASIS (ARGON PLASMA COAGULATION/BICAP);  Surgeon: Irving Copas., MD;  Location: [facility];  Service: Gastroenterology;  Laterality: N/A;   I & D EXTREMITY  09/15/2011   Procedure: IRRIGATION AND DEBRIDEMENT EXTREMITY w REMOVAL OF HARDWARE;  Surgeon: [name], MD;  Location: [facility];  Service: Orthopedics;  Laterality: Right;   INCISION AND DRAINAGE HIP  12/21/2011   Procedure: IRRIGATION AND DEBRIDEMENT HIP;  Surgeon: [name], MD;  Location: [place];  Service: Orthopedics;  Laterality: Right;  I&D RIGHT HIP WITH PLACEMENT ANTIBIOTIC SPACER   INSERTION OF DIALYSIS CATHETER     Procedure: INSERTION OF DIALYSIS CATHETER;  Surgeon: Rosetta Posner, MD;  Location: [place];  Service: Vascular;  Laterality: Right;   IRRIGATION AND DEBRIDEMENT ABSCESS  12/21/11    right hip   JOINT REPLACEMENT     KIDNEY TRANSPLANT  07/19/15   LAPAROSCOPIC GASTRIC BANDING  2008   LEFT ATRIAL APPENDAGE OCCLUSION  10/04/2017   OPEN REDUCTION INTERNAL FIXATION (ORIF) DISTAL RADIAL FRACTURE Right 10/18/2016   Procedure: OPEN REDUCTION INTERNAL FIXATION (ORIF) RIGHT DISTAL RADIAL FRACTURE;  Surgeon: Marchia Bond, MD;  Location: [place] [facility];  Service: Orthopedics;  Laterality: Right;   SUBMUCOSAL TATTOO INJECTION  07/10/2021   Procedure: SUBMUCOSAL TATTOO INJECTION;  Surgeon: Irving Copas., MD;  Location: [facility];  Service: Gastroenterology;;   TOTAL HIP ARTHROPLASTY Right 02/2011   right THA 02/2011, I&D/removal of hardware 09/2011,, repeat I&D Jun 2013, reimplantation R THA 03-26-2012   TOTAL HIP REVISION  03/25/2012   Procedure: TOTAL HIP REVISION;  Surgeon: [name], MD;  Location: [facility];  Service: Orthopedics;  Laterality: Right;  Right Total Hip Reimplantation   TUBAL LIGATION     VENA CAVA FILTER PLACEMENT  11/2011     A IV Location/Drains/Wounds Patient Lines/Drains/Airways Status     Active Line/Drains/Airways     Name Placement date Placement time Site Days   Peripheral IV 01/19/22 24 G Right Arm 01/19/22  0143  Arm  less than 1   Peripheral IV 01/19/22 22 G Right Forearm 01/19/22  0401  Forearm  less than 1   Vascular Access Left Arteriovenous fistula 09/28/11  0914  --  3766   Vascular Access Left Upper arm Arteriovenous fistula --  --  Upper arm  --   Vascular Access Left Upper arm Arteriovenous fistula --  --  Upper arm  --   Fistula / Graft Left Arteriovenous fistula --  --  --  --   Pressure Injury 07/08/21 Sacrum Stage 2 -  Partial thickness loss of dermis presenting as a shallow open injury with a red, pink wound bed without slough. 07/08/21  0358  -- 195   Wound / Incision (Open or Dehisced) 07/25/21 Skin tear Perineum Right 1cm X 0.5cm stage 2 ulcer 07/25/21  0610  Perineum  178            Intake/Output Last  24 hours  Intake/Output Summary (Last 24 hours) at 01/19/2022 1306 Last data filed at 01/19/2022 1105 Gross per 24 hour  Intake 1995 ml  Output --  Net 1995 ml    Labs/Imaging Results for orders  placed or performed during the hospital encounter of 01/19/22 (from the past 48 hour(s))  Type and screen     Status: None (Preliminary result)   Collection Time: 01/19/22  2:08 AM  Result Value Ref Range   ABO/RH(D) O POS    Antibody Screen NEG    Sample Expiration 01/22/2022,2359    Unit Number [id number]    Blood Component Type RBC LR PHER2    Unit division 00    Status of Unit ISSUED    Transfusion Status OK TO TRANSFUSE    Crossmatch Result      Compatible Performed at [facility], [place] [address], [place], [place] [address]    Unit Number [id number]    Blood Component Type RBC LR PHER2    Unit division 00    Status of Unit ISSUED    Transfusion Status OK TO TRANSFUSE    Crossmatch Result Compatible   CBC with Differential     Status: Abnormal   Collection Time: 01/19/22  2:08 AM  Result Value Ref Range   WBC 8.5 4.0 - 10.5 K/uL   RBC 2.34 (L) 3.87 - 5.11 MIL/uL   Hemoglobin 6.6 (LL) 12.0 - 15.0 g/dL    Comment: REPEATED TO VERIFY THIS CRITICAL RESULT HAS VERIFIED AND BEEN CALLED TO JESSICA F RN BY BERTRAM TAYLOR ON 07 07 2023 AT 0301, AND HAS BEEN READ BACK.     HCT 21.6 (L) 36.0 - 46.0 %   MCV 92.3 80.0 - 100.0 fL   MCH 28.2 26.0 - 34.0 pg   MCHC 30.6 30.0 - 36.0 g/dL   RDW 21.1 (H) 11.5 - 15.5 %   Platelets 242 150 - 400 K/uL   nRBC 0.0 0.0 - 0.2 %   Neutrophils Relative % 87 %   Neutro Abs 7.4 1.7 - 7.7 K/uL   Lymphocytes Relative 8 %   Lymphs Abs 0.7 0.7 - 4.0 K/uL   Monocytes Relative 5 %   Monocytes Absolute 0.4 0.1 - 1.0 K/uL   Eosinophils Relative 0 %   Eosinophils Absolute 0.0 0.0 - 0.5 K/uL   Basophils Relative 0 %   Basophils Absolute 0.0 0.0 - 0.1 K/uL   nRBC 0 0 /100 WBC   Abs Immature Granulocytes 0.00 0.00 - 0.07 K/uL   Acanthocytes  PRESENT    Schistocytes PRESENT    Tear Drop Cells PRESENT    Burr Cells PRESENT     Comment: Performed at [facility], [place] [address] [place], [place] [address]  Comprehensive metabolic panel     Status: Abnormal   Collection Time: 01/19/22  2:08 AM  Result Value Ref Range   Sodium 139 135 - 145 mmol/L   Potassium 3.6 3.5 - 5.1 mmol/L   Chloride 105 98 - 111 mmol/L   CO2 25 22 - 32 mmol/L   Glucose, Bld 138 (H) 70 - 99 mg/dL    Comment: Glucose reference range applies only to samples taken after fasting for at least 8 hours.   BUN 44 (H) 8 - 23 mg/dL   Creatinine, Ser 3.14 (H) 0.44 - 1.00 mg/dL   Calcium 9.0 8.9 - 10.3 mg/dL   Total Protein 5.3 (L) 6.5 - 8.1 g/dL   Albumin 2.7 (L) 3.5 - 5.0 g/dL   AST 16 15 - 41 U/L   ALT 8 0 - 44 U/L   Alkaline Phosphatase 58 38 - 126 U/L   Total Bilirubin 0.9 0.3 - 1.2 mg/dL  GFR, Estimated 15 (L) >60 mL/min    Comment: (NOTE) Calculated using the CKD-EPI Creatinine Equation (2021)    Anion gap 9 5 - 15    Comment: Performed at [facility], [place] [address] [place], [place] [address]  Prepare RBC (crossmatch)     Status: None   Collection Time: 01/19/22  3:21 AM  Result Value Ref Range   Order Confirmation      ORDER PROCESSED BY BLOOD BANK Performed at [facility], [place] [address], [place], [place] [address]   Protime-INR     Status: Abnormal   Collection Time: 01/19/22  3:51 AM  Result Value Ref Range   Prothrombin Time 16.8 (H) 11.4 - 15.2 seconds   INR 1.4 (H) 0.8 - 1.2    Comment: (NOTE) INR goal varies based on device and disease states. Performed at [facility], [place] [address] [place], [place] [address]    CT ABDOMEN PELVIS WO CONTRAST  Result Date: 01/19/2022 CLINICAL DATA:  Status post trauma. EXAM: CT ABDOMEN AND PELVIS WITHOUT CONTRAST TECHNIQUE: Multidetector CT imaging of the abdomen and pelvis was performed following the standard protocol without IV contrast. RADIATION DOSE REDUCTION: This  exam was performed according to the departmental dose-optimization program which includes automated exposure control, adjustment of the mA and/or kV according to patient size and/or use of iterative reconstruction technique. COMPARISON:  January 09, 2022 FINDINGS: Lower chest: There is moderate to marked severity cardiomegaly. Mild atelectatic changes are seen within the bilateral lung bases. Hepatobiliary: No focal liver abnormality is seen. Status post cholecystectomy. No biliary dilatation. Pancreas: Unremarkable. No pancreatic ductal dilatation or surrounding inflammatory changes. Spleen: Normal in size without focal abnormality. Adrenals/Urinary Tract: Adrenal glands are unremarkable. Diffusely atrophic native kidneys are seen with diffuse renal cortical thinning. Multiple complex hemorrhagic or proteinaceous bilateral renal cysts are noted. These are stable in size and appearance when compared to the prior exam. No renal calculi or hydronephrosis is seen. Stable moderate severity left-sided perinephric inflammatory fat stranding is noted. A renal transplant is seen within the right pelvis, without hydronephrosis or renal calculi. The urinary bladder is limited in evaluation secondary to overlying streak artifact. Stomach/Bowel: There is evidence of prior gastric banding surgery. The appendix is not clearly identified. No evidence of bowel dilatation. Noninflamed diverticula are seen throughout the large bowel. Vascular/Lymphatic: Aortic atherosclerosis. Inferior vena cava filter is noted. No enlarged abdominal or pelvic lymph nodes. Reproductive: The uterus and bilateral adnexa are markedly limited in evaluation secondary to overlying streak artifact. Other: A large, complex, heterogeneous collection is again seen within the superficial soft tissues along the anterior aspect of the right hip and right iliac region. This measures approximately 12.5 cm x 11.5 cm x 17.3 cm and is increased in size when compared to  the prior study. Interval development of a similar appearing area is seen along the anterior aspect of the symphysis pubis. This measures approximately 4.7 cm x 14.8 cm x 7.1 cm, with an adjacent less distinct portion seen along the anterior aspect of the left hip. A 3.5 cm x 3.2 cm x 3.8 cm heterogeneous collection is again seen within the superficial soft tissues of the anterolateral aspect of the proximal right thigh. This is decreased in size when compared to the prior study. Diffuse anasarca is again seen. 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 There is associated streak artifact and subsequently limited evaluation of the adjacent osseous and soft tissue structures. Chronic compression  fracture deformities are seen at the levels of L1 and L2 with multilevel degenerative changes noted throughout the lumbar spine. IMPRESSION: 1. Large hematoma within the superficial soft tissues along the anterior aspect of the right hip and right iliac region, increased in size when compared to the prior study. 2. Interval development of an additional large hematoma along the anterior aspect of the symphysis pubis and left hip. 3. Interval decrease in size of the hematoma seen within the superficial soft tissues of the anterolateral aspect of the proximal right thigh on the prior exam. 4. Moderate to marked severity cardiomegaly. 5. Colonic diverticulosis. 6. Stable bilateral hemorrhagic and/or proteinaceous renal cysts. MRI correlation is recommended, as an underlying neoplastic process cannot be excluded. 7. Chronic compression fracture deformities at the levels of L1 and L2 with multilevel degenerative changes throughout the lumbar spine. 8. Right hip replacement with streak artifact and limited evaluation of the adjacent osseous and soft tissue structures. Aortic Atherosclerosis (ICD10-I70.0). Electronically Signed   By: Virgina Norfolk M.D.   On: 01/19/2022 03:12   DG Chest Portable 1 View  Result  Date: 01/19/2022 CLINICAL DATA:  Low blood pressure. EXAM: PORTABLE CHEST 1 VIEW COMPARISON:  Radiograph 06/30/2021 FINDINGS: Single lead left-sided pacemaker in place. Atrial occlusion device in place. Mild cardiomegaly. Aortic tortuosity. No pulmonary edema or confluent airspace disease. No pleural effusion or pneumothorax. Gastric band in the upper abdomen. The bones are under mineralized IMPRESSION: Mild cardiomegaly without congestive failure or acute pulmonary process. Electronically Signed   By: Keith Rake M.D.   On: 01/19/2022 02:09    Pending Labs Unresulted Labs (From admission, onward)     Start     Ordered   01/20/22 4536  Basic metabolic panel  Tomorrow morning,   R        01/19/22 0510   01/20/22 0500  CBC  Tomorrow morning,   R        01/19/22 0600   01/19/22 0511  Hemoglobin and hematocrit, blood  Now then every 6 hours,   R      01/19/22 0510            Vitals/Pain Today's Vitals   01/19/22 1145 01/19/22 1200 01/19/22 1215 01/19/22 1230  BP: (!) 171/85 (!) 174/79 (!) 159/93 (!) 171/80  Pulse: 70  72 73  Resp: 19 (!) '21 19 19  '$ Temp:      TempSrc:      SpO2: 99%  100% 99%  PainSc:        Isolation Precautions No active isolations  Medications Medications  fentaNYL (SUBLIMAZE) injection 25-50 mcg (50 mcg Intravenous Given 01/19/22 0724)  carvedilol (COREG) tablet 3.125 mg (3.125 mg Oral Given 01/19/22 0725)  furosemide (LASIX) tablet 80 mg (80 mg Oral Given 01/19/22 0810)  mycophenolate (MYFORTIC) EC tablet 180 mg (180 mg Oral Given 01/19/22 0907)  predniSONE (DELTASONE) tablet 5 mg (5 mg Oral Given 01/19/22 0907)  tacrolimus (PROGRAF) capsule 1 mg (1 mg Oral Not Given 01/19/22 1136)  pantoprazole (PROTONIX) EC tablet 20 mg (20 mg Oral Given 01/19/22 0907)  ondansetron (ZOFRAN) tablet 4 mg (has no administration in time range)    Or  ondansetron (ZOFRAN) injection 4 mg (has no administration in time range)  acetaminophen (TYLENOL) tablet 650 mg (has no  administration in time range)    Or  acetaminophen (TYLENOL) suppository 650 mg (has no administration in time range)  HYDROmorphone (DILAUDID) injection 0.5 mg (0.5 mg Intravenous Given 01/19/22 0217)  lactated ringers  bolus 1,000 mL (0 mLs Intravenous Stopped 01/19/22 0441)  0.9 %  sodium chloride infusion (0 mL/hr Intravenous Stopped 01/19/22 1115)  fentaNYL (SUBLIMAZE) injection 50 mcg (50 mcg Intravenous Given 01/19/22 0443)    Mobility walks with device Moderate fall risk   Focused Assessments Neuro Assessment Handoff:  Swallow screen pass? Yes          Neuro Assessment:   Neuro Checks:      Last Documented NIHSS Modified Score:   Has TPA been given? No If patient is a Neuro Trauma and patient is going to OR before floor call report to [place] nurse: [phone] or [phone]  ,  R Recommendations: See Admitting Provider Note  Report given to:

## 2022-01-19 NOTE — ED Notes (Signed)
Unable to get patient blood.

## 2022-01-19 NOTE — Assessment & Plan Note (Signed)
Listed in problem list in chart, though patient and husband, actually denied this to me when I asked.  Maybe a miscommunication?

## 2022-01-19 NOTE — TOC Progression Note (Signed)
Transition of Care [facility]) - Initial/Assessment Note    Patient Details  Name: [name] MRN: [id number] Date of Birth: [birth year]  Transition of Care [facility]) CM/SW Contact:    Milinda Antis, LCSWA Phone Number: 01/19/2022, 4:00 PM  Clinical Narrative:                  Transition of Care Department [facility]) has reviewed patient. Patient arrived from home due to lower abd pain and pelvic pain.  We will continue to monitor patient advancement through interdisciplinary progression rounds.   Patient Goals and CMS Choice        Expected Discharge Plan and Services                                                Prior Living Arrangements/Services                       Activities of Daily Living Home Assistive Devices/Equipment: Cane (specify quad or straight), Walker (specify type), Wheelchair ADL Screening (condition at time of admission) Patient's cognitive ability adequate to safely complete daily activities?: Yes Is the patient deaf or have difficulty hearing?: No Does the patient have difficulty seeing, even when wearing glasses/contacts?: No Does the patient have difficulty concentrating, remembering, or making decisions?: No Patient able to express need for assistance with ADLs?: Yes Does the patient have difficulty dressing or bathing?: Yes Independently performs ADLs?: No Communication: Independent Dressing (OT): Independent Grooming: Independent Feeding: Independent Bathing: Needs assistance Toileting: Needs assistance In/Out Bed: Needs assistance Walks in Home: Needs assistance Does the patient have difficulty walking or climbing stairs?: Yes Weakness of Legs: Both Weakness of Arms/Hands: Both  Permission Sought/Granted                  Emotional Assessment              Admission diagnosis:  Hematoma [T14.8XXA] Anemia, unspecified type [D64.9] ABLA (acute blood loss anemia) [D62] Patient Active Problem List   Diagnosis Date  Noted   ABLA (acute blood loss anemia) 01/19/2022   Bilateral renal cysts 01/19/2022   At high risk for falls 01/11/2022   Hematoma 01/11/2022   Right lower quadrant abdominal mass 12/25/2021   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   Renal transplant recipient 08/13/2021   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   Immunocompromised patient ([place]) 08/13/2021   Chronic atrial fibrillation (HCC)    Right leg weakness 07/23/2021   Erosive esophagitis/esophageal ulceration/duodenal ulceration 07/23/2021   Edema of right lower leg 07/23/2021   Acute GI bleeding 07/08/2021   Azotemia 07/08/2021   Pressure injury of skin 07/08/2021   Asthma, chronic, unspecified asthma severity, with acute exacerbation 06/15/2021   Chronic combined systolic and diastolic CHF (congestive heart failure) ([place]) 06/15/2021   Duodenal ulcer 03/29/2021   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   Gastroesophageal reflux disease 06/29/2020   Iron deficiency anemia 06/29/2020   SOB (shortness of breath) 03/11/2020   Traumatic seroma of lower leg, right, subsequent encounter 01/28/2020   Other secondary pulmonary hypertension ([place]) 12/03/2019   Anticoagulation management encounter 11/04/2019   Pacemaker 04/18/2018   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   Personal history of PE (pulmonary embolism) 05/39/7673   Chronic systolic heart failure ([place]) 02/11/2018   Hyperkalemia 02/03/2018   Lower extremity edema 12/10/2017  Presence of Watchman left atrial appendage closure device 10/04/2017   Dyspnea 07/26/2017   Atrial flutter ([place])    Oligouria    Hypertension    Fractures, stress    Eczema    Depression    Constipation    Arthritis    Exacerbation of asthma 04/01/2017   Closed fracture of right distal radius 10/18/2016   Proteinuria 09/04/2016   History of kidney transplant 2017 07/25/2015   Immunosuppression ([place]) 07/25/2015   Nonischemic dilated cardiomyopathy (HCC)    S/P  insertion of IVC (inferior vena caval) filter 04/07/2013   Use of cane as ambulatory aid 04/07/2013   S/P hip replacement 03/25/2012   Personal history of DVT/PE s/p IVC filter 12/08/2011   Obstructive sleep apnea 12/08/2011   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   CKD (chronic kidney disease), stage V ([place]) 09/14/2011   Hypercalcemia    Gout 09/10/2011   Acute blood loss anemia secondary to diverticular bleed  09/04/2011   Absence of right hip joint with antibiotic pacer 09/03/2011   Traumatic seroma of thigh ([place]) 06/11/2011   Clostridium difficile colitis 06/09/2011   Leukocytosis 06/08/2011   Symptomatic anemia 06/06/2011   SINUSITIS- ACUTE-NOS 09/27/2010   Atrial fibrillation s/p AVN ablation/PPM implant, s/p LAAC with Watchman 11/25/2008   Cerebral artery occlusion with cerebral infarction ([place]) 09/28/2008   History of CVA with residual deficit 09/28/2008   Hemiplegia ([place]) 09/21/2008   Psoriasis 10/20/2007   Asthmatic bronchitis without complication 44/96/7591   PCP:  [name], NP Pharmacy:   [id number] [place] [place] [address]Phone: [phone]: [phone] [place] [address]Phone: [phone]: [phone]    Social Determinants of Health (SDOH) Interventions    Readmission Risk Interventions    07/13/2021    9:48 AM 11/18/2019   10:19 AM  Readmission Risk Prevention Plan  Transportation Screening Complete Complete  PCP or Specialist Appt within 3-5 Days  Complete  HRI or [place] Complete  Social Work Consult for RAdmirePlanning/Counseling  Complete  Palliative Care Screening  Not Applicable  Medication Review (Press photographer Complete Complete  HRI or [place]   SW Recovery Care/Counseling Consult Complete    Palliative Care Screening Not [place] Lake[place] Applicable

## 2022-01-19 NOTE — Assessment & Plan Note (Addendum)
S/p renal transplant. Creat today of 3.1 looks to be c/w her baseline over last several months. Continue prednisone, prograf, and myfortic.  Med rec pending, but will order the doses in computer system which pt tells me are likely correct.  Dont want her to risk missing these completely.

## 2022-01-19 NOTE — Assessment & Plan Note (Signed)
Seen on CT this past month and again today. B hemorrhagic cysts / masses cant be ruled out. 1. Patient likely needs follow up MRI of kidneys. 1. PPM is presumably MRI conditional, just had MRI brain in Jan.

## 2022-01-19 NOTE — Assessment & Plan Note (Addendum)
Holding eliquis in setting of acute abdominal wall hematoma with ABLA. Sounds like eliquis was restarted in Jan following acute CVA despite h/o Watchman procedure. Will need to defer to day team + cards in follow up (Dr. Haroldine Laws patient) when / if to restart eliquis in future.

## 2022-01-19 NOTE — Assessment & Plan Note (Signed)
Also h/o GI bleeds due to duodenal ulcers, erosive esophagitis, and diverticular bleeds. Continue protonix.

## 2022-01-19 NOTE — Progress Notes (Signed)
Patient is admitted to the hospital early this morning , patient is seen and examined, detail please see H&P this morning. She is pleasant report pain medication helped, she appears slightly confused, did not remember she received blood transfusion, cannot remember the year.  No sign of external bleed, blood pressure slightly elevated, will add on as needed hydralazine, she asked if she can go home if her hemoglobin stable tomorrow, we agreed to be evaluated by physical therapy tomorrow.

## 2022-01-19 NOTE — Assessment & Plan Note (Addendum)
Cont home meds for now including lasix.  Getting fluids in form of PRBC transfusion. Watch for volume overload post transfusion.

## 2022-01-20 DIAGNOSIS — D62 Acute posthemorrhagic anemia: Secondary | ICD-10-CM | POA: Diagnosis not present

## 2022-01-20 LAB — BASIC METABOLIC PANEL
Anion gap: 9 (ref 5–15)
BUN: 43 mg/dL — ABNORMAL HIGH (ref 8–23)
CO2: 24 mmol/L (ref 22–32)
Calcium: 8.8 mg/dL — ABNORMAL LOW (ref 8.9–10.3)
Chloride: 104 mmol/L (ref 98–111)
Creatinine, Ser: 3.03 mg/dL — ABNORMAL HIGH (ref 0.44–1.00)
GFR, Estimated: 16 mL/min — ABNORMAL LOW (ref >60)
Glucose, Bld: 96 mg/dL (ref 70–99)
Potassium: 3.7 mmol/L (ref 3.5–5.1)
Sodium: 137 mmol/L (ref 135–145)

## 2022-01-20 LAB — TYPE AND SCREEN
ABO/RH(D): O POS
Antibody Screen: NEGATIVE
Unit division: 0
Unit division: 0

## 2022-01-20 LAB — BPAM RBC
Blood Product Expiration Date: 202308012359
Blood Product Expiration Date: 202308012359
ISSUE DATE / TIME: 202307070356
ISSUE DATE / TIME: 202307070701
Unit Type and Rh: 5100
Unit Type and Rh: 5100

## 2022-01-20 LAB — HEMOGLOBIN AND HEMATOCRIT, BLOOD
HCT: 23.7 % — ABNORMAL LOW (ref 36.0–46.0)
Hemoglobin: 7.8 g/dL — ABNORMAL LOW (ref 12.0–15.0)

## 2022-01-20 LAB — CBC
HCT: 24.3 % — ABNORMAL LOW (ref 36.0–46.0)
Hemoglobin: 7.8 g/dL — ABNORMAL LOW (ref 12.0–15.0)
MCH: 28.6 pg (ref 26.0–34.0)
MCHC: 32.1 g/dL (ref 30.0–36.0)
MCV: 89 fL (ref 80.0–100.0)
Platelets: 159 10*3/uL (ref 150–400)
RBC: 2.73 MIL/uL — ABNORMAL LOW (ref 3.87–5.11)
RDW: 18.8 % — ABNORMAL HIGH (ref 11.5–15.5)
WBC: 11 10*3/uL — ABNORMAL HIGH (ref 4.0–10.5)
nRBC: 0 % (ref 0.0–0.2)

## 2022-01-20 MED ORDER — OXYCODONE HCL 5 MG PO TABS
5.0000 mg | ORAL_TABLET | Freq: Four times a day (QID) | ORAL | Status: DC | PRN
  Administered 2022-01-20: 5 mg via ORAL
  Filled 2022-01-20: qty 1

## 2022-01-20 MED ORDER — SODIUM CHLORIDE 0.9 % IV SOLN
250.0000 mg | Freq: Every day | INTRAVENOUS | Status: AC
  Administered 2022-01-20: 250 mg via INTRAVENOUS
  Filled 2022-01-20: qty 20

## 2022-01-20 MED ORDER — POLYETHYLENE GLYCOL 3350 17 G PO PACK: 17.0000 g | PACK | Freq: Every day | ORAL | 0 refills | Status: AC

## 2022-01-20 MED ORDER — SENNOSIDES-DOCUSATE SODIUM 8.6-50 MG PO TABS: 1.0000 | ORAL_TABLET | Freq: Every day | ORAL | 0 refills | Status: AC

## 2022-01-20 MED ORDER — SENNOSIDES-DOCUSATE SODIUM 8.6-50 MG PO TABS
1.0000 | ORAL_TABLET | Freq: Two times a day (BID) | ORAL | Status: DC
  Administered 2022-01-20: 1 via ORAL
  Filled 2022-01-20: qty 1

## 2022-01-20 MED ORDER — POLYETHYLENE GLYCOL 3350 17 G PO PACK
17.0000 g | PACK | Freq: Every day | ORAL | Status: DC
  Filled 2022-01-20: qty 1

## 2022-01-20 MED ORDER — OXYCODONE HCL 5 MG PO TABS
5.0000 mg | ORAL_TABLET | Freq: Four times a day (QID) | ORAL | 0 refills | Status: DC | PRN
Start: 2022-01-20 — End: 2022-05-09

## 2022-01-20 MED ORDER — ATORVASTATIN CALCIUM 20 MG PO TABS: 20.0000 mg | ORAL_TABLET | Freq: Every day | ORAL | 1 refills | Status: DC

## 2022-01-20 NOTE — Discharge Summary (Signed)
Discharge Summary  [name] BUL:[id number] DOB: Dec 12, 1945  PCP: [name], NP  Admit date: 01/19/2022 Discharge date: 01/20/2022    Time spent: 36mns  Recommendations for Outpatient Follow-up:  F/u with PCP within a week  for hospital discharge follow up, repeat cbc/bmp F/u with cardiology  F/u with nephrology  Home health order placed     Discharge Diagnoses:  Active Hospital Problems   Diagnosis Date Noted   ABLA (acute blood loss anemia) 01/19/2022    Priority: High   Chronic combined systolic and diastolic CHF (congestive heart failure) ([place] 06/15/2021    Priority: Medium    CKD (chronic kidney disease), stage V ([place] 09/14/2011    Priority: Medium    Atrial fibrillation s/p AVN ablation/PPM implant, s/p LAAC with Watchman 11/25/2008    Priority: Medium    Bilateral renal cysts 01/19/2022    Priority: Low   Gastroesophageal reflux disease 06/29/2020    Priority: Low   Personal history of DVT/PE s/p IVC filter 12/08/2011    [facility]  No resolved problems to display.    Discharge Condition: stable  Diet recommendation: heart healthy  There were no vitals filed for this visit.  History of present illness: ( per admitting provider Dr GAlcario Drought HPI: Jane CASINOis a 76y.o. female with medical history significant of CKD 5 s/p renal transplant in 2017; A.Fib s/p watchmans procedure, AV node ablation, and PPM; despite Watchman's procedure pt had stroke in Jan of this year prompting them to put her back on eliquis; h/o GIB; CHF follows with Dr. BHaroldine Laws   Since the stroke, it sounds like she has had more frequent falls.   She was seen last month on June 18th for a fall in the ED.  Had hematoma of R abd wall at that time.  Hematoma appears to be stable on repeat CT on 6/27.   Second fall last Friday.  No hematoma at that time per pt, not seen in ED then.  However, developed onset of LLQ     Abd pain and hematoma  earlier this evening.  Symptoms onset around 2100.  EMS called out to house for this and dizziness.  Initial BP at house was 732Zsystolic which improved to 80s without intervention.   In Ed: Pt given 1L IVF bolus with normotension after. CT scan confirms new L abd wall hematoma as well as a slightly improved / healing R abd wall hematoma.   HGB 6.6 in ED down from 7.2 last month (looks like baseline before the falls and hematomas was 9.5-11 range).   2u PRBC transfusion ordered and ongoing at this time.    Hospital Course:  Principal Problem:   ABLA (acute blood loss anemia) Active Problems:   Atrial fibrillation s/p AVN ablation/PPM implant, s/p LAAC with Watchman   CKD (chronic kidney disease), stage V (HCC)   Chronic combined systolic and diastolic CHF (congestive heart failure) (HCC)   Personal history of DVT/PE s/p IVC filter   Gastroesophageal reflux disease   Bilateral renal cysts   Assessment and Plan:  * ABLA (acute blood loss anemia) -Due to abdominal wall hematomas.  One somewhat acute on chronic (occurred after falls last month) -EDP spoke with General surgery Nothing to do surgically Bleed will tamponade itself off Dont think abd binder will be of any benefit either. -Hgb 6.6, received two prbc transfusion, hgb has been stable at 7.8 x2 -Received iv iron transfusion per family  request ( as she has been getting iv iron q2weeks this year through nephrology Dr foster, she missed dose on Friday due to hospitalization) -Eliquis held on admission, recommend continue hold eliquis, repeat cbc on Monday or Tuesday, family is advised to discuss risk and benefit of resuming Eliquis with patient's pcp and cardiologist   Atrial fibrillation s/p AVN ablation/PPM implant, s/p LAAC with Watchman -Sounds like eliquis was restarted in Jan following acute CVA despite h/o Watchman procedure. -Holding eliquis in setting of acute abdominal wall hematoma with ABLA. -family is advised to  discuss risk and benefit of resuming Eliquis with patient's pcp and cardiologist    Chronic combined systolic and diastolic CHF (congestive heart failure) ([place]) Cont home meds  Appear euvolemic at discharge  H/o CVA with cognitive impairment  Family state she is at baseline   CKD (chronic kidney disease), stage V ([place]) S/p renal transplant. Creat today of 3.1 looks to be c/w her baseline over last several months. Continue home meds  F/u with nephrology.   Bilateral renal cysts Seen on CT this past month and again this time B hemorrhagic cysts / masses cant be ruled out. Patient likely needs follow up MRI of kidneys. PPM is presumably MRI conditional, just had MRI brain in Jan.  Gastroesophageal reflux disease Also h/o GI bleeds due to duodenal ulcers, erosive esophagitis, and diverticular bleeds. Continue protonix. Denies signs of external bleed  history of DVT/PE s/p IVC filter IVC filter seen on CT scan  FTT/Falls Seen by PT who recommend home health, family in agreement, home health order placed, family states patient has all the DME needed    Discharge Exam: BP 120/74 (BP Location: Right Arm)   Pulse 76   Temp 98.4 F (36.9 C) (Oral)   Resp 18   Ht '5\' 5"'$  (1.651 m)   SpO2 100%   BMI 17.47 kg/m   General: NAD, pleasant , baseline cognitive impairment per family  Cardiovascular: paced rhythm Respiratory: normal respiratory effort     Discharge Instructions     Diet - low sodium heart healthy   Complete by: As directed    Increase activity slowly   Complete by: As directed       Allergies as of 01/20/2022       Reactions   Warfarin Sodium Other (See Comments)   Caused her to have a stroke/left side of brain to hemorrhage    Ace Inhibitors Cough   Amiodarone Other (See Comments)   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   albuterol (2.5 MG/3ML) 0.083% nebulizer solution Commonly known as: PROVENTIL Take 3 mLs (2.5 mg total) by nebulization every 6 (six) hours as needed for wheezing or shortness of breath. Dx:J45.909   albuterol 108 (90 Base) MCG/ACT inhaler Commonly known as: VENTOLIN HFA INHALE 1-2 PUFFS BY MOUTH EVERY 6 HOURS AS NEEDED FOR WHEEZE OR SHORTNESS OF BREATH   atorvastatin 20 MG tablet Commonly known as: LIPITOR Take 1 tablet (20 mg total) by mouth daily. Reported not taking,  please verify with  your doctor to see if you should  take this medication or not.  In general patient with h/o stroke would benefit from this medication unless contraindicated. What changed: additional instructions   budesonide-formoterol 160-4.5 MCG/ACT inhaler Commonly known as: SYMBICORT Inhale 2 puffs into the lungs 2 (two) times daily.   carvedilol 3.125 MG tablet Commonly known as: COREG Take 1 tablet (3.125 mg total) by mouth 2 (two) times daily with a meal.   docusate sodium 100 MG capsule Commonly known as: COLACE Take 100 mg by mouth daily.   FLUoxetine 20 MG capsule Commonly known as: PROZAC Take 1 capsule (20 mg total) by mouth daily.   furosemide 80 MG tablet Commonly known as: LASIX Take 80 mg by mouth 3 (three) times a week.   HYDROcodone-acetaminophen 5-325 MG tablet Commonly known as: NORCO/VICODIN Take 1 tablet by mouth every 6 (six) hours as needed.   lactulose 10 GM/15ML solution Commonly known as: CHRONULAC Take 30 mLs (20 g total) by mouth daily as needed for mild constipation or severe constipation.   mycophenolate 180 MG EC tablet Commonly known as: MYFORTIC Take 180 mg by mouth 2 (two) times daily.   ondansetron 4 MG tablet Commonly known as:  ZOFRAN Take 1 tablet (4 mg total) by mouth every 8 (eight) hours as needed for up to 9 doses for nausea.   oxyCODONE 5 MG immediate release tablet Commonly known as: Oxy IR/ROXICODONE Take 1 tablet (5 mg total) by mouth every 6 (six) hours as needed for severe pain.   pantoprazole 20 MG tablet Commonly known as: PROTONIX Take 1 tablet (20 mg total) by mouth daily.   polyethylene glycol 17 g packet Commonly known as: MIRALAX / GLYCOLAX Take 17 g by mouth daily. Hold if diarrhea   potassium chloride 10 MEQ CR capsule Commonly known as: MICRO-K Take 10 mEq by mouth 3 (three) times a week.   predniSONE 5 MG tablet Commonly known as: DELTASONE Take 5 mg by mouth daily.   senna-docusate 8.6-50 MG tablet Commonly known as: Senokot-S Take 1 tablet by mouth at bedtime for 14 days. Hold if diarrhea   tacrolimus 1 MG capsule Commonly known as: PROGRAF Take 1 mg by mouth 2 (two) times daily.       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   Specialty: Internal Medicine Why: hospital discharge follow up, Contact information: [place] [address] [phone]         Bensimhon, Shaune Pascal, MD Follow up.   Specialty: Cardiology Why: please discuss with Dr Haroldine Laws regarding risk and benefit of resuming eliquis Contact information: [address] [place] [address] [phone]         repeat cbc on monday or Tuesday to monitor anemia Follow up.                   The results of significant diagnostics from this hospitalization (including imaging, microbiology, ancillary and  laboratory) are listed below for reference.    Significant Diagnostic Studies: CT ABDOMEN PELVIS WO CONTRAST  Result Date: 01/19/2022 CLINICAL DATA:  Status post trauma. EXAM: CT ABDOMEN AND PELVIS WITHOUT CONTRAST TECHNIQUE: Multidetector CT imaging of the abdomen and pelvis was performed following the standard protocol without IV contrast. RADIATION DOSE REDUCTION: This exam was performed according to the departmental dose-optimization program which includes automated exposure control, adjustment of the mA and/or kV according to patient size and/or use of iterative reconstruction technique. COMPARISON:  January 09, 2022 FINDINGS: Lower chest: There is moderate to marked severity cardiomegaly. Mild atelectatic changes are seen within the bilateral lung bases. Hepatobiliary: No focal liver abnormality is seen. Status post cholecystectomy. No biliary dilatation. Pancreas: Unremarkable. No pancreatic ductal dilatation or surrounding inflammatory changes. Spleen: Normal in size without focal abnormality. Adrenals/Urinary Tract: Adrenal glands are unremarkable. Diffusely atrophic native kidneys are seen with diffuse renal cortical thinning. Multiple complex hemorrhagic or proteinaceous bilateral renal cysts are noted. These are stable in size and appearance when compared to the prior exam. No renal calculi or hydronephrosis is seen. Stable moderate severity left-sided perinephric inflammatory fat stranding is noted. A renal transplant is seen within the right pelvis, without hydronephrosis or renal calculi. The urinary bladder is limited in evaluation secondary to overlying streak artifact. Stomach/Bowel: There is evidence of prior gastric banding surgery. The appendix is not clearly identified. No evidence of bowel dilatation. Noninflamed diverticula are seen throughout the large bowel. Vascular/Lymphatic: Aortic atherosclerosis. Inferior vena cava filter is noted. No enlarged abdominal or pelvic lymph nodes.  Reproductive: The uterus and bilateral adnexa are markedly limited in evaluation secondary to overlying streak artifact. Other: A large, complex, heterogeneous collection is again seen within the superficial soft tissues along the anterior aspect of the right hip and right iliac region. This measures approximately 12.5 cm x 11.5 cm x 17.3 cm and is increased in size when compared to the prior study. Interval development of a similar appearing area is seen along the anterior aspect of the symphysis pubis. This measures approximately 4.7 cm x 14.8 cm x 7.1 cm, with an adjacent less distinct portion seen along the anterior aspect of the left hip. A 3.5 cm x 3.2 cm x 3.8 cm heterogeneous collection is again seen within the superficial soft tissues of the anterolateral aspect of the proximal right thigh. This is decreased in size when compared to the prior study. Diffuse anasarca is again seen. 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 There is associated streak artifact and subsequently limited evaluation of the adjacent osseous and soft tissue structures. Chronic compression fracture deformities are seen at the levels of L1 and L2 with multilevel degenerative changes noted throughout the lumbar spine. IMPRESSION: 1. Large hematoma within the superficial soft tissues along the anterior aspect of the right hip and right iliac region, increased in size when compared to the prior study. 2. Interval development of an additional large hematoma along the anterior aspect of the symphysis pubis and left hip. 3. Interval decrease in size of the hematoma seen within the superficial soft tissues of the anterolateral aspect of the proximal right thigh on the prior exam. 4. Moderate to marked severity cardiomegaly. 5. Colonic diverticulosis. 6. Stable bilateral hemorrhagic and/or proteinaceous renal cysts. MRI correlation is recommended, as an underlying neoplastic process cannot be excluded. 7. Chronic compression  fracture deformities at the levels of L1 and L2 with multilevel degenerative changes throughout the lumbar spine. 8. Right hip replacement with streak artifact and limited evaluation of the adjacent osseous and soft tissue structures. Aortic Atherosclerosis (ICD10-I70.0). Electronically Signed  By: Virgina Norfolk M.D.   On: 01/19/2022 03:12   DG Chest Portable 1 View  Result Date: 01/19/2022 CLINICAL DATA:  Low blood pressure. EXAM: PORTABLE CHEST 1 VIEW COMPARISON:  Radiograph 06/30/2021 FINDINGS: Single lead left-sided pacemaker in place. Atrial occlusion device in place. Mild cardiomegaly. Aortic tortuosity. No pulmonary edema or confluent airspace disease. No pleural effusion or pneumothorax. Gastric band in the upper abdomen. The bones are under mineralized IMPRESSION: Mild cardiomegaly without congestive failure or acute pulmonary process. Electronically Signed   By: Keith Rake M.D.   On: 01/19/2022 02:09   CT Abdomen Pelvis Wo Contrast  Result Date: 01/09/2022 CLINICAL DATA:  Abdominal trauma, blunt abnormal pelvis US indicating possible hematoma after recent fall EXAM: CT ABDOMEN AND PELVIS WITHOUT CONTRAST TECHNIQUE: Multidetector CT imaging of the abdomen and pelvis was performed following the standard protocol without IV contrast. RADIATION DOSE REDUCTION: This exam was performed according to the departmental dose-optimization program which includes automated exposure control, adjustment of the mA and/or kV according to patient size and/or use of iterative reconstruction technique. COMPARISON:  Ultrasound 01/02/2022.  CT 10/04/2009, CT 02/28/2021 FINDINGS: Lower chest: Left basilar atelectasis.  Cardiomegaly. Hepatobiliary: No focal liver abnormality is seen. Status post cholecystectomy. No biliary dilatation. Pancreas: Grossly unremarkable, evaluation limited in the absence of intravenous contrast. Spleen: Normal in size without focal abnormality. Adrenals/Urinary Tract: Unremarkable  adrenal glands. Transplant kidney in the right pelvis without hydronephrosis. Native kidneys are atrophic. Multiple hyperdense rounded structures within each kidney, largest on the left measuring up to 3.0 cm, possibly representing complex hemorrhagic or proteinaceous cysts. No hydronephrosis of the native kidneys. Urinary bladder is largely obscured by metallic artifact from patient's hip prosthesis. Stomach/Bowel: Oral contrast is present throughout the bowel. Gastric lap band in place. No dilated loops of bowel. No evidence of focal bowel wall thickening or inflammatory changes. Vascular/Lymphatic: Aortic atherosclerosis. Tortuosity of the abdominal aorta without aneurysm. IVC filter in place. No appreciable lymphadenopathy. Reproductive: No mass or other abnormality evident, evaluation of the deep pelvis degraded by artifact. Other: No ascites.  No pneumoperitoneum. Musculoskeletal: Large complex hyperdense collection within the superficial soft tissues overlying the right hip and right iliac region measuring approximately 12.6 x 9.0 x 12.0 cm. Additional smaller hyperdense collection within the superficial soft tissues overlying the anterolateral aspect of the proximal right thigh measuring 5.5 x 2.3 x 4.5 cm. Diffuse anasarca. Chronic severe compression fracture of L1. Chronic moderate L2 compression fracture. Postsurgical changes of right total hip arthroplasty without dislocation. Fractured cerclage wires again seen. Bones are demineralized. IMPRESSION: 1. Large complex hyperdense collection within the superficial soft tissues overlying the right hip and right iliac region anteriorly measuring up to 12.6 cm. Additional smaller hyperdense collection within the superficial soft tissues overlying the anterolateral aspect of the proximal right thigh measuring up to 5.5 cm. Findings are suggestive of hematomas. 2. Transplant kidney in the right pelvis without hydronephrosis. 3. Multiple hyperdense rounded  structures within each kidney, largest on the left measuring up to 3.0 cm, possibly representing complex hemorrhagic or proteinaceous cysts. Solid mass is not excluded. Follow-up contrast-enhanced renal protocol CT or MRI is recommended on a nonemergent basis. Aortic Atherosclerosis (ICD10-I70.0). Electronically Signed   By: Davina Poke D.O.   On: 01/09/2022 09:34   US PELVIS LIMITED (TRANSABDOMINAL ONLY)  Result Date: 01/03/2022 CLINICAL DATA:  Abdominopelvic mass.  History of falls. EXAM: US PELVIS LIMITED TECHNIQUE: Ultrasound examination of the pelvic soft tissues was performed in the area of clinical concern.  COMPARISON:  Right hip series 12/31/2021. Right lower extremity color flow duplex Doppler 12/19/2021. FINDINGS: A 15.7 x 6.0 x 14.7 cm complex superficial mass is noted over the right hip area in the region of clinical concern. Given patient's history this may represent a large hematoma. An infectious or malignant etiology cannot be excluded. This does not have a typical appearance of a hernia. Minimal blood flow noted within the mass. IMPRESSION: A 15.7 x 6.0 x 14.7 complex superficial mass is noted over the right hip area in the region of clinical concern. Given the patient's history this may represent a large hematoma. Other etiologies cannot be excluded. Follow-up exam can be obtained to demonstrate resolution. Electronically Signed   By: Marcello Moores  Register M.D.   On: 01/03/2022 09:56   DG Hip Unilat  With Pelvis 2-3 Views Right  Result Date: 12/31/2021 CLINICAL DATA:  Fall, right hip pain EXAM: DG HIP (WITH OR WITHOUT PELVIS) 2-3V RIGHT COMPARISON:  X-ray 03/25/2012 FINDINGS: Diffuse osseous demineralization. Patient is status post proximal right femoral resection and right total hip arthroplasty. Age indeterminate fractures of both proximal cerclage wires. The distal cerclage wire appears intact. Arthroplasty components are appropriately aligned without dislocation. Pelvic bony ring  appears intact without diastasis. IMPRESSION: 1. Prior right proximal femoral resection with right total hip arthroplasty. There are age-indeterminate fractures of both proximal cerclage wires. The distal cerclage wire appears intact. 2. Diffuse osseous demineralization without evidence of acute fracture. Electronically Signed   By: Davina Poke D.O.   On: 12/31/2021 19:23    Microbiology: No results found for this or any previous visit (from the past 240 hour(s)).   Labs: Basic Metabolic Panel: Recent Labs  Lab 01/19/22 0208 01/20/22 0742  NA 139 137  K 3.6 3.7  CL 105 104  CO2 25 24  GLUCOSE 138* 96  BUN 44* 43*  CREATININE 3.14* 3.03*  CALCIUM 9.0 8.8*   Liver Function Tests: Recent Labs  Lab 01/19/22 0208  AST 16  ALT 8  ALKPHOS 58  BILITOT 0.9  PROT 5.3*  ALBUMIN 2.7*   No results for input(s): "LIPASE", "AMYLASE" in the last 168 hours. No results for input(s): "AMMONIA" in the last 168 hours. CBC: Recent Labs  Lab 01/19/22 0208 01/19/22 1904 01/19/22 2331 01/20/22 0742 01/20/22 1247  WBC 8.5  --   --  11.0*  --   NEUTROABS 7.4  --   --   --   --   HGB 6.6* 9.0* 8.0* 7.8* 7.8*  HCT 21.6* 27.4* 24.5* 24.3* 23.7*  MCV 92.3  --   --  89.0  --   PLT 242  --   --  159  --    Cardiac Enzymes: No results for input(s): "CKTOTAL", "CKMB", "CKMBINDEX", "TROPONINI" in the last 168 hours. BNP: BNP (last 3 results) Recent Labs    06/14/21 1319  BNP 2,060.1*    ProBNP (last 3 results) No results for input(s): "PROBNP" in the last 8760 hours.  CBG: No results for input(s): "GLUCAP" in the last 168 hours.  FURTHER DISCHARGE INSTRUCTIONS:   Get Medicines reviewed and adjusted: Please take all your medications with you for your next visit with your Primary MD   Laboratory/radiological data: Please request your Primary MD to go over all hospital tests and procedure/radiological results at the follow up, please ask your Primary MD to get all Hospital records  sent to his/her office.   In some cases, they will be blood work, cultures and biopsy results  pending at the time of your discharge. Please request that your primary care M.D. goes through all the records of your hospital data and follows up on these results.   Also Note the following: If you experience worsening of your admission symptoms, develop shortness of breath, life threatening emergency, suicidal or homicidal thoughts you must seek medical attention immediately by calling 911 or calling your MD immediately  if symptoms less severe.   You must read complete instructions/literature along with all the possible adverse reactions/side effects for all the Medicines you take and that have been prescribed to you. Take any new Medicines after you have completely understood and accpet all the possible adverse reactions/side effects.    Do not drive when taking Pain medications or sleeping medications (Benzodaizepines)   Do not take more than prescribed Pain, Sleep and Anxiety Medications. It is not advisable to combine anxiety,sleep and pain medications without talking with your primary care practitioner   Special Instructions: If you have smoked or chewed Tobacco  in the last 2 yrs please stop smoking, stop any regular Alcohol  and or any Recreational drug use.   Wear Seat belts while driving.   Please note: You were cared for by a hospitalist during your hospital stay. Once you are discharged, your primary care physician will handle any further medical issues. Please note that NO REFILLS for any discharge medications will be authorized once you are discharged, as it is imperative that you return to your primary care physician (or establish a relationship with a primary care physician if you do not have one) for your post hospital discharge needs so that they can reassess your need for medications and monitor your lab values.     Signed:  Florencia Reasons MD, PhD, FACP  [facility] 01/20/2022,  1:14 PM

## 2022-01-20 NOTE — Progress Notes (Signed)
Patient shows no acute distress or pain noted. Ferric gluconate was given, family at the bedside all together educated with AVS.

## 2022-01-20 NOTE — Evaluation (Addendum)
Physical Therapy Evaluation Patient Details Name: [name] MRN: [id number] DOB: 04-14-1946 Today's Date: 01/20/2022  History of Present Illness  76 yo female presenting to ED 01/19/22 after series of falls, hypotensive in the 70s and onset of LLQ pain. Hgb 6.6 in ED given 2 units PRBC and admitted for observation of acute blood loss anemia due to abdominal wall hematomas and chronic combined systolic and diastolic [facility]: CKD 5 s/p renal transplant in 2017; A.Fib s/p watchmans procedure, AV node ablation, and PPM; despite Watchman's procedure pt had stroke in Jan of this year prompting them to put her back on eliquis; h/o GIB;  Clinical Impression  PTA pt living with husband in single story home with 3 steps to enter. Pt reports ambulating in home with Rollator and either using Rollator or wheelchair for limited community ambulation. Pt reports independence with bathing and dressing, husband and daughters assist with iADLs. Pt is currently limited in safe mobility by dizziness and slight orthostatic hypotension with positional change, as well as L UE pain in presence of decreased strength, balance and endurance. Pt is currently minA for bed mobility and transfers. PT currently recommending HHPT at discharge for improved strength, endurance. PT will continue to follow acutely.  Orthostatic BPs  Supine 119/70  Sitting 90/55  Sitting after 3 min 106/60  Standing 116/64  Returned to supine due to L UE pain        Recommendations for follow up therapy are one component of a multi-disciplinary discharge planning process, led by the attending physician.  Recommendations may be updated based on patient status, additional functional criteria and insurance authorization.  Follow Up Recommendations Home health PT      Assistance Recommended at Discharge Frequent or constant Supervision/Assistance  Patient can return home with the following  A little help with walking and/or transfers;A little help  with bathing/dressing/bathroom;Assistance with cooking/housework;Direct supervision/assist for medications management;Direct supervision/assist for financial management;Assist for transportation;Help with stairs or ramp for entrance    Equipment Recommendations None recommended by PT  Recommendations for Other Services  OT consult    Functional Status Assessment Patient has had a recent decline in their functional status and demonstrates the ability to make significant improvements in function in a reasonable and predictable amount of time.     Precautions / Restrictions Precautions Precautions: Fall Precaution Comments: fall resulting in current hospitalization Restrictions Weight Bearing Restrictions: No      Mobility  Bed Mobility Overal bed mobility: Needs Assistance Bed Mobility: Supine to Sit, Sit to Supine     Supine to sit: HOB elevated, Min guard Sit to supine: HOB elevated, Min assist   General bed mobility comments: min guard for safety and cues for sequencing for coming to EoB , cuing and light min A for returning LE to bed,    Transfers Overall transfer level: Needs assistance Equipment used: Rolling walker (2 wheels) Transfers: Sit to/from Stand Sit to Stand: Min assist           General transfer comment: good power up requires min A and increased bracing of LE posteriorly on bed to achieve standing balance    Ambulation/Gait               General Gait Details: deferred due to increased L sided pain and request to return to bed     Balance Overall balance assessment: Needs assistance Sitting-balance support: Feet supported, No upper extremity supported, Bilateral upper extremity supported, Single extremity supported Sitting balance-Leahy Scale: Fair  Standing balance support: Bilateral upper extremity supported, Reliant on assistive device for balance Standing balance-Leahy Scale: Poor                                Pertinent Vitals/Pain Pain Assessment Pain Assessment: 0-10 Pain Score: 7  Pain Location: L shoulder Pain Descriptors / Indicators: Sore, Guarding, Grimacing, Moaning Pain Intervention(s): Limited activity within patient's tolerance, Monitored during session, Repositioned, Patient requesting pain meds-RN notified    Home Living Family/patient expects to be discharged to:: Private residence Living Arrangements: Spouse/significant other Available Help at Discharge: Family;Available 24 hours/day Type of Home: House Home Access: Stairs to enter   [employer] of Steps: 3   Home Layout: One level Home Equipment: Transport planner;Wheelchair - Banker;Adaptive equipment;Hand held shower head;Tub bench;BSC/3in1;Cane - single point;Rolling Walker (2 wheels);Grab bars - tub/shower Additional Comments: Adjustable bed without rails. Sliding board. Lift recliner    Prior Function Prior Level of Function : Needs assist       Physical Assist : Mobility (physical);ADLs (physical) Mobility (physical): Gait ADLs (physical): IADLs Mobility Comments: use Rollator in house, and wheelchair for limited community ambulation ADLs Comments: reports independence with ADLs, daughters and husband assist for iADLs     Hand Dominance   Dominant Hand: Right    Extremity/Trunk Assessment   Upper Extremity Assessment Upper Extremity Assessment: Defer to OT evaluation    Lower Extremity Assessment Lower Extremity Assessment: RLE deficits/detail;LLE deficits/detail RLE Deficits / Details: AROM WFL, strength grossly 3/5 RLE Coordination: decreased fine motor LLE Deficits / Details: AAROM WFL, limited strength in L LE LLE Coordination: decreased fine motor    Cervical / Trunk Assessment Cervical / Trunk Assessment: Kyphotic  Communication   Communication: No difficulties  Cognition Arousal/Alertness: Awake/alert Behavior During Therapy: WFL for tasks  assessed/performed Overall Cognitive Status: Impaired/Different from baseline Area of Impairment: Safety/judgement, Problem solving                         Safety/Judgement: Decreased awareness of safety, Decreased awareness of deficits   Problem Solving: Slow processing, Decreased initiation, Requires verbal cues, Requires tactile cues General Comments: slowed processing and decreased awareness of medical condition        General Comments General comments (skin integrity, edema, etc.): Pt with c/o of dizziness and slightly orthostatic with positional change        Assessment/Plan    PT Assessment Patient needs continued PT services  PT Problem List Decreased strength;Decreased activity tolerance;Decreased balance;Decreased mobility;Decreased coordination;Decreased cognition;Decreased safety awareness;Cardiopulmonary status limiting activity;Pain       PT Treatment Interventions DME instruction;Gait training;Stair training;Functional mobility training;Therapeutic activities;Therapeutic exercise;Balance training;Cognitive remediation;Patient/family education    PT Goals (Current goals can be found in the Care Plan section)  Acute Rehab PT Goals Patient Stated Goal: feel better PT Goal Formulation: With patient Time For Goal Achievement: 02/03/22 Potential to Achieve Goals: Fair    Frequency Min 4X/week        AM-PAC PT "6 Clicks" Mobility  Outcome Measure Help needed turning from your back to your side while in a flat bed without using bedrails?: None Help needed moving from lying on your back to sitting on the side of a flat bed without using bedrails?: A Little Help needed moving to and from a bed to a chair (including a wheelchair)?: A Little Help needed standing up from a chair using your arms (e.g., wheelchair or bedside  chair)?: A Little Help needed to walk in hospital room?: A Little Help needed climbing 3-5 steps with a railing? : A Lot 6 Click Score:  18    End of Session Equipment Utilized During Treatment: Gait belt Activity Tolerance: Other (comment);Patient limited by pain (dizziness) Patient left: in bed;with call bell/phone within reach;with bed alarm set Nurse Communication: Mobility status;Other (comment);Patient requests pain meds (telebox not working) PT Visit Diagnosis: Unsteadiness on feet (R26.81);Other abnormalities of gait and mobility (R26.89);Muscle weakness (generalized) (M62.81);Repeated falls (R29.6);History of falling (Z91.81);Difficulty in walking, not elsewhere classified (R26.2);Dizziness and giddiness (R42);Pain;Hemiplegia and hemiparesis Hemiplegia - Right/Left: Left Hemiplegia - dominant/non-dominant: Non-dominant Hemiplegia - caused by: Cerebral infarction Pain - Right/Left: Left Pain - part of body: Shoulder    Time: 4742-5956 PT Time Calculation (min) (ACUTE ONLY): 30 min   Charges:   PT Evaluation $PT Eval Moderate Complexity: 1 Mod PT Treatments $Therapeutic Activity: 8-22 mins        Jerrell Hart B. Migdalia Dk PT, DPT Acute Rehabilitation Services Please use secure chat or  Call Office [phone]   [place] 01/20/2022, 9:57 AM

## 2022-01-21 NOTE — Progress Notes (Addendum)
[facility] Palliative Care Follow-Up Visit Telephone: [phone]  Fax: [phone]   Date of encounter: 01/21/22 4:11 PM PATIENT NAME: [name] [address] [place] [address]   [phone] (home)  DOB: August 04, 1945 MRN: [id number] PRIMARY CARE PROVIDER:    [name], NP,  [place] [address] [phone]  REFERRING PROVIDER:   [name], NP [place],  [place] [address] [phone]  RESPONSIBLE PARTY:    Contact Information     Name Relation Home Work Mobile   [name] Spouse [phone]  [phone]   [name] Daughter   [phone]   [name] Daughter   [phone]   [name] Daughter   [phone]       Due to the COVID-19 crisis, this visit was done via telemedicine from my office and it was initiated and consent by this patient and or family.  I connected with  [name] on 01/22/22 by a video enabled telemedicine application and verified that I am speaking with the correct person using two identifiers.   I discussed the limitations of evaluation and management by telemedicine. The patient expressed understanding and agreed to proceed.  Palliative Care was asked to follow this patient by consultation request of  [name], [name], NP to address advance care planning and complex medical decision making. This is follow-up visit.                                     ASSESSMENT AND PLAN / RECOMMENDATIONS:   Advance Care Planning/Goals of Care: Goals include to maximize quality of life and symptom management. Patient/health care surrogate gave his/her permission to discuss.Our advance care planning conversation included a discussion about:    The value and importance of advance care planning  Experiences with loved ones who have been seriously ill or have died  Exploration of personal, cultural or spiritual beliefs  that might influence medical decisions  Exploration of goals of care in the event of a sudden injury or illness  Identification  of a healthcare agent  Review and updating or creation of an  advance directive document . Decision not to resuscitate or to de-escalate disease focused treatments due to poor prognosis. CODE STATUS:  full code  Symptom Management/Plan: 1. Weight loss -pt trying to following a regular schedule for meals and does better with that than listening to her appetite b/c she's not had one in years now -last weight was 105 lbs down from 117 lb in april  2. Palliative care by specialist -will need to set up in person visit--there were connection issues on  this virtual visit so it was  incomplete  Follow up Palliative Care Visit: Palliative care will continue to follow for complex medical decision making, advance care planning, and clarification of goals. Schedule in person within the next two weeks.  PPS: 40%  HOSPICE ELIGIBILITY/DIAGNOSIS: TBD  Chief Complaint: Follow-up palliative visit  HISTORY OF PRESENT ILLNESS:  [name] is a 76 y.o. year old female  with extensive past medical history including mild cognitive impairment, prior stroke with RLE weakness despite her watchman procedure for afib s/p AVN ablation and PPM placement, pelvic masses vs cysts, chronic systolic chf, post-inflammatory pulmonary fibrosis, depression, CKD5 s/p renal transplant in 2017 and recent falls with hematoma causing considerable anemia requiring 2 units prbcs during hospitalization 7/7-8 at  [facility].    I had seen her at [facility] during her rehab stay s/p stroke and covid  and we are now following up from palliative care for a virtual visit.  Notes indicate some question about whether her eliquis should be resumed in context of her considerable hematoma from a fall.  Also, we had intended to tackle completion of a MOST form at her f/u visit.  PPS at time of rehab discharge was  50%.  I note that she has had considerable weight loss of almost 20 lbs over the course of this year.  Goes up and down with weight.  Does not have an appetite--has to set that she's going to eat at certain times.  She's tried to be good about that.  Last several days, she's done pretty good.  Prior to that average to below average.     History obtained from review of EMR, discussion with primary team, and interview with family, facility staff/caregiver and/or [name].   I reviewed available labs, medications, imaging, studies and related documents from the EMR.  Records reviewed and summarized above.   ROS Review of Systems  Constitutional:  Negative for chills and fever.  HENT:  Negative for congestion.   Respiratory:  Negative for chest tightness and shortness of breath.   Gastrointestinal:  Negative for constipation.  Genitourinary:  Negative for dysuria.  Musculoskeletal:  Positive for gait problem.  Skin:  Negative for color change.  Neurological:  Negative for dizziness and weakness.  Psychiatric/Behavioral:  Positive for confusion.     Physical Exam: There were no vitals filed for this visit. There is no height or weight on file to calculate BMI. Wt Readings from Last 500 Encounters:  01/11/22 105 lb (47.6 kg)  12/25/21 113 lb 3.2 oz (51.3 kg)  11/29/21 109 lb 3.2 oz (49.5 kg)  11/21/21 108 lb (49 kg)  11/14/21 108 lb (49 kg)  11/08/21 117 lb (53.1 kg)  10/10/21 109 lb (49.4 kg)  09/08/21 117 lb (53.1 kg)  08/23/21 115 lb (52.2 kg)  07/25/21 123 lb 3.1 oz (55.9 kg)  07/21/21 123 lb 3.2 oz (55.9 kg)  07/11/21 115 lb 11.9 oz (52.5 kg)  06/21/21 126 lb 3.2 oz (57.2 kg)  06/14/21 125 lb (56.7 kg)  06/13/21 125 lb 12.8 oz (57.1 kg)  05/19/21 128 lb 6.4 oz (58.2 kg)  04/26/21 133 lb (60.3 kg)  03/29/21 141 lb 9.6 oz (64.2 kg)  03/25/21 153 lb (69.4 kg)  01/26/21 137 lb 6.4 oz (62.3 kg)  01/10/21 127 lb 3.2 oz (57.7 kg)  01/05/21 125 lb (56.7 kg)  11/14/20 139 lb  6.4 oz (63.2 kg)  11/03/20 142 lb 12.8 oz (64.8 kg)  06/29/20 138 lb 12.8 oz (63 kg)  05/17/20 141 lb (64 kg)  05/05/20 134 lb (60.8 kg)  04/12/20 134 lb 9.6 oz (61.1 kg)  03/24/20 134 lb (60.8 kg)  03/12/20 142 lb 10.2 oz (64.7 kg)  03/10/20 142 lb 12.8 oz (64.8 kg)  02/04/20 145 lb (65.8 kg)  01/28/20 144 lb (65.3 kg)  01/04/20 146 lb 3.2 oz (66.3 kg)  12/29/19 148 lb (67.1 kg)  12/03/19 145 lb 6.4 oz (66 kg)  11/27/19 141 lb (64 kg)  11/19/19 150 lb 6.4 oz (68.2 kg)  11/16/19 139 lb (63 kg)  11/04/19 148 lb 12.8 oz (67.5 kg)  10/28/19 154 lb 3.2 oz (69.9 kg)  10/25/19 156 lb 4.9 oz (70.9 kg)  10/22/19 150 lb 6.4  oz (68.2 kg)  05/29/19 154 lb 9.6 oz (70.1 kg)  04/22/19 152 lb 3.2 oz (69 kg)  12/29/18 151 lb (68.5 kg)  11/25/18 156 lb (70.8 kg)  11/05/18 157 lb 12.8 oz (71.6 kg)  09/10/18 165 lb (74.8 kg)  08/26/18 162 lb 9.6 oz (73.8 kg)  07/29/18 164 lb 12.8 oz (74.8 kg)  07/21/18 179 lb 9.6 oz (81.5 kg)  07/01/18 178 lb (80.7 kg)  06/26/18 176 lb (79.8 kg)  06/23/18 176 lb (79.8 kg)  06/20/18 182 lb (82.6 kg)  03/24/18 194 lb 3.2 oz (88.1 kg)  03/14/18 201 lb (91.2 kg)  03/07/18 201 lb (91.2 kg)  02/19/18 222 lb (100.7 kg)  02/05/18 222 lb 3.6 oz (100.8 kg)  12/31/17 214 lb 3.2 oz (97.2 kg)  12/16/17 214 lb 9.6 oz (97.3 kg)  12/10/17 218 lb (98.9 kg)  12/04/17 221 lb (100.2 kg)  12/02/17 220 lb (99.8 kg)  11/13/17 230 lb (104.3 kg)  11/03/17 230 lb 13.2 oz (104.7 kg)  10/02/17 224 lb (101.6 kg)  09/04/17 229 lb (103.9 kg)  09/02/17 231 lb 9.6 oz (105.1 kg)  07/31/17 246 lb 3.2 oz (111.7 kg)  07/26/17 249 lb (112.9 kg)  07/26/17 249 lb 12.8 oz (113.3 kg)  07/19/17 252 lb 12.8 oz (114.7 kg)  06/30/17 232 lb (105.2 kg)  04/19/17 240 lb (108.9 kg)  04/01/17 244 lb (110.7 kg)  10/18/16 232 lb (105.2 kg)  09/18/16 233 lb 3.2 oz (105.8 kg)  05/08/16 223 lb 9.6 oz (101.4 kg)  09/13/15 228 lb 6.4 oz (103.6 kg)  09/07/14 241 lb 3.2 oz (109.4 kg)  08/12/14 240 lb  (108.9 kg)  06/17/14 232 lb (105.2 kg)  03/02/14 230 lb (104.3 kg)  12/01/13 233 lb (105.7 kg)  05/28/13 214 lb (97.1 kg)  05/14/13 225 lb (102.1 kg)  05/08/13 220 lb (99.8 kg)  05/15/12 208 lb (94.3 kg)  03/31/12 216 lb 4.3 oz (98.1 kg)  03/11/12 191 lb (86.6 kg)  02/19/12 194 lb (88 kg)  12/24/11 218 lb 11.1 oz (99.2 kg)  12/08/11 223 lb (101.2 kg)  12/04/11 202 lb (91.6 kg)  11/09/11 202 lb (91.6 kg)  11/06/11 202 lb (91.6 kg)  10/01/11 222 lb 14.2 oz (101.1 kg)  09/03/11 260 lb (117.9 kg)  08/28/11 260 lb (117.9 kg)  06/06/11 260 lb 12.9 oz (118.3 kg)  02/02/11 260 lb (117.9 kg)  11/08/10 (!) 253 lb (114.8 kg)  10/10/10 (!) 252 lb (114.3 kg)  09/27/10 (!) 260 lb 9.6 oz (118.2 kg)  08/01/06 (!) 319 lb (144.7 kg)   Physical Exam Constitutional:      Comments: Frail appearing  HENT:     Head: Normocephalic and atraumatic.  Neurological:     Mental Status: She is alert and oriented to person, place, and time.  Psychiatric:        Mood and Affect: Mood normal.     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   Immunocompromised patient ([place]) 08/13/2021   Chronic atrial fibrillation (HCC)    Right leg weakness 07/23/2021   Erosive esophagitis/esophageal ulceration/duodenal ulceration 07/23/2021  Edema of right lower leg 07/23/2021   Acute GI bleeding 07/08/2021   Azotemia 07/08/2021   Pressure injury of skin 07/08/2021   Asthma, chronic, unspecified asthma severity, with acute exacerbation 06/15/2021   Chronic combined systolic and diastolic CHF (congestive heart failure) ([place]) 06/15/2021   Duodenal ulcer  03/29/2021   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   Gastroesophageal reflux disease 06/29/2020   Iron deficiency anemia 06/29/2020   SOB (shortness of breath) 03/11/2020   Traumatic seroma of lower leg, right, subsequent encounter 01/28/2020   Other secondary pulmonary hypertension ([place]) 12/03/2019   Anticoagulation management encounter 11/04/2019   Pacemaker 04/18/2018   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   Personal history of PE (pulmonary embolism) 36/14/4315   Chronic systolic heart failure ([place]) 02/11/2018   Hyperkalemia 02/03/2018   Lower extremity edema 12/10/2017   Presence of Watchman left atrial appendage closure device 10/04/2017   Dyspnea 07/26/2017   Atrial flutter ([place])    Oligouria    Hypertension    Fractures, stress    Eczema    Depression    Constipation    Arthritis    Exacerbation of asthma 04/01/2017   Closed fracture of right distal radius 10/18/2016   Proteinuria 09/04/2016   History of kidney transplant 2017 07/25/2015   Immunosuppression ([place]) 07/25/2015   Nonischemic dilated cardiomyopathy (HCC)    S/P insertion of IVC (inferior vena caval) filter 04/07/2013   Use of cane as ambulatory aid 04/07/2013   S/P hip replacement 03/25/2012   Personal history of DVT/PE s/p IVC filter 12/08/2011   Obstructive sleep apnea 12/08/2011   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/2011   CKD (chronic kidney disease), stage V ([place]) 09/14/2011   Hypercalcemia    Gout 09/10/2011   Acute blood loss anemia secondary to diverticular bleed  09/04/2011   Absence of right hip joint with antibiotic pacer 09/03/2011   Traumatic seroma of thigh ([place]) 06/11/2011   Clostridium difficile colitis 06/09/2011   Leukocytosis 06/08/2011   Symptomatic anemia 06/06/2011   SINUSITIS- ACUTE-NOS 09/27/2010   Atrial fibrillation s/p AVN ablation/PPM implant, s/p LAAC with Watchman 11/25/2008   Cerebral artery occlusion with cerebral infarction ([place]) 09/28/2008   History of CVA  with residual deficit 09/28/2008   Hemiplegia ([employer]) 09/21/2008   Psoriasis 10/20/2007   Asthmatic bronchitis without complication 40/02/6760    PAST MEDICAL HISTORY:  Active Ambulatory Problems    Diagnosis Date Noted   Hemiplegia ([place]) 09/21/2008   Atrial fibrillation s/p AVN ablation/PPM implant, s/p LAAC with Watchman 11/25/2008   Cerebral artery occlusion with cerebral infarction ([place]) 09/28/2008   Asthmatic bronchitis without complication 95/03/3266   Psoriasis 10/20/2007   History of CVA with residual deficit 09/28/2008   SINUSITIS- ACUTE-NOS 09/27/2010   Symptomatic anemia 06/06/2011   Leukocytosis 06/08/2011   Clostridium difficile colitis 06/09/2011   Traumatic seroma of thigh ([place]) 06/11/2011   Absence of right hip joint with antibiotic pacer 09/03/2011   Acute blood loss anemia secondary to diverticular bleed  09/04/2011   Gout 09/10/2011   Hypercalcemia    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/24/2011   Personal history of DVT/PE s/p IVC filter 12/08/2011   Obstructive sleep apnea 12/08/2011   S/P hip replacement 03/25/2012   Nonischemic dilated cardiomyopathy ([place])    History of kidney transplant 2017 07/25/2015   Closed fracture of right distal radius 10/18/2016   Exacerbation of asthma 04/01/2017   Oligouria    Hypertension    Fractures, stress    CKD (chronic kidney disease),  stage V ([place]) 09/14/2011   Eczema    Depression    Constipation    Arthritis    Atrial flutter (HCC)    Dyspnea 07/26/2017   Lower extremity edema 12/10/2017   Hyperkalemia 02/03/2018   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   Personal history of PE (pulmonary embolism) 02/22/2018   S/P insertion of IVC (inferior vena caval) filter 02/77/4128   Chronic systolic heart failure ([place]) 02/11/2018   Immunosuppression ([place]) 07/25/2015   Use of cane as ambulatory aid 04/07/2013   Pacemaker 04/18/2018   Presence of Watchman left atrial appendage closure device 10/04/2017   Proteinuria  09/04/2016   Anticoagulation management encounter 11/04/2019   Other secondary pulmonary hypertension ([place]) 12/03/2019   Traumatic seroma of lower leg, right, subsequent encounter 01/28/2020   SOB (shortness of breath) 03/11/2020   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   Gastroesophageal reflux disease 06/29/2020   Iron deficiency anemia 06/29/2020   Duodenal ulcer 03/29/2021   Asthma, chronic, unspecified asthma severity, with acute exacerbation 06/15/2021   Chronic combined systolic and diastolic CHF (congestive heart failure) ([place]) 06/15/2021   Acute GI bleeding 07/08/2021   Azotemia 07/08/2021   Pressure injury of skin 07/08/2021   Right leg weakness 07/23/2021   Erosive esophagitis/esophageal ulceration/duodenal ulceration 07/23/2021   Edema of right lower leg 07/23/2021   Chronic atrial fibrillation ([place])    Renal transplant recipient 08/13/2021   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   Immunocompromised patient ([place]) 08/13/2021   Postinflammatory pulmonary fibrosis ([place]) 11/14/2021   Right lower quadrant abdominal mass 12/25/2021   At high risk for falls 01/11/2022   Hematoma 01/11/2022   ABLA (acute blood loss anemia) 01/19/2022   Bilateral renal cysts 01/19/2022   Resolved Ambulatory Problems    Diagnosis Date Noted   TINEA CORPORIS 11/03/2008   Morbid obesity ([place]) 10/20/2007   DEPRESSION 11/25/2008   Essential hypertension 07/07/2007   Acute kidney injury superimposed on CKD ([place]) 11/25/2008   SEBACEOUS CYST, BREAST 11/03/2008   Nausea with vomiting 10/04/2009   Abdominal pain, unspecified site 10/04/2009   Low back pain radiating to right leg 10/10/2010   Hypokalemia 06/06/2011   ARF (acute renal failure) ([place]) 06/06/2011   Acute-on-chronic kidney injury ([place]) 09/10/2011   Dehydration 09/10/2011   Community acquired pneumonia of left lower lobe of lung 09/10/2011   HCAP (healthcare-associated pneumonia)    Pseudomonas aeruginosa infection  10/24/2011   ESRD (end stage renal disease) ([place]) 10/24/2011   End stage renal disease ([place]) 11/06/2011   Abscess of hip, right 12/24/2011   Constipation due to pain medication 02/12/2012   Expected blood loss anemia 03/26/2012   Hypotension 03/27/2012   Abscess of shoulder 05/15/2012   Skin ulceration ([place]) 05/09/2013   Stroke due to intracerebral hemorrhage ([place]) 07/17/2007   Stroke ([place]) 07/17/2007   Sleep apnea    Peripheral vascular disease ([place])    DVT of lower extremity, bilateral ([place]) 12/21/2011   Distal radius fracture, right    Acute respiratory failure with hypoxia ([place]) 09/04/2017   Chronic respiratory failure with hypoxia ([place]) 10/03/2017   UTI (urinary tract infection) 11/03/2017   Diarrhea 11/03/2017   Septic shock ([place]) 11/03/2017   Acute kidney injury superimposed on chronic kidney disease ([place]) 78/67/6720   Acute diastolic (congestive) heart failure ([place]) 11/17/2017   Abscess of right leg 12/14/2017   Atrial fibrillation with RVR ([place]) 02/03/2018   Persistent atrial fibrillation ([place]) 08/12/2017   Oral mucositis 02/22/2018   S/P AV nodal ablation  10/08/2018   Acute on chronic combined systolic and diastolic congestive heart failure ([place]) 10/22/2019   COPD exacerbation (HCC)    Acute CHF (congestive heart failure) ([place]) 10/23/2019   Acute GI bleeding 11/14/2019   GI bleed 11/14/2019   Wound of right leg 01/28/2020   Pneumonia due to COVID-19 virus 03/11/2020   Hematoma of left lower leg 05/17/2020   Bacteriuria, asymptomatic 11/14/2020   Acute respiratory failure with hypoxia ([place]) 06/14/2021   Acute renal failure ([place]) 06/15/2021   Acute CVA (cerebrovascular accident) ([place]) 07/25/2021   Past Medical History:  Diagnosis Date   AKI (acute kidney injury) ([place]) 11/04/2017   Anemia    Anxiety    Asthma    Atrial fibrillation ([place])    Benign hypertension with ESRD (end-stage renal disease) ([place])    Blood transfusion    Cardiomyopathy Mar 2013    Cardiomyopathy [facility])    CHF (congestive heart failure) ([place]) 2005   CHF (congestive heart failure) ([place])    Childhood asthma    CMV (cytomegalovirus infection) ([place]) 10/03/2015   ESRD (end stage renal disease) on dialysis ([place]) 09/2011   Generalized edema [id number]   Gout    Hearing difficulty    High cholesterol    History of CVA (cerebrovascular accident) without residual deficits    History of hip replacement, total, right    History of pneumonia    Hx of Clostridium difficile infection    Hyperparathyroidism ([place]) 04/07/2013   Hypomagnesemia 08/02/2015   Hypophosphatemia 11/08/2017   ICB (intracranial bleed) (HCC)    Memory changes    Obesity 04/07/2013   OSA on CPAP    Sepsis ([place]) 11/08/2017   Stress fracture    Suture reaction 09/04/2016   Transfusion history     SOCIAL HX:  Social History   Tobacco Use   Smoking status: Never   Smokeless tobacco: Never  Substance Use Topics   Alcohol use: Never     ALLERGIES:  Allergies  Allergen Reactions   Warfarin Sodium Other (See Comments)    Caused her to have a stroke/left side of brain to hemorrhage    Ace Inhibitors Cough   Amiodarone Other (See Comments)    Pt with Restrictive Lung Disease by 10/2017 PFT with decreased DLCO   Amlodipine Swelling    Swelling in feet      PERTINENT MEDICATIONS:  Outpatient Encounter Medications as of 01/22/2022  Medication Sig   acetaminophen (TYLENOL) 500 MG tablet Take 500 mg by mouth every 6 (six) hours as needed for mild pain or headache.    albuterol (PROVENTIL) (2.5 MG/3ML) 0.083% nebulizer solution Take 3 mLs (2.5 mg total) by nebulization every 6 (six) hours as needed for wheezing or shortness of breath. Dx:J45.909   albuterol (VENTOLIN HFA) 108 (90 Base) MCG/ACT inhaler INHALE 1-2 PUFFS BY MOUTH EVERY 6 HOURS AS NEEDED FOR WHEEZE OR SHORTNESS OF BREATH   atorvastatin (LIPITOR) 20 MG tablet Take 1 tablet (20 mg total) by mouth daily. Reported not taking,  please verify with  your doctor to see if you should  take this medication or not.  In general patient with h/o stroke would benefit from this medication unless contraindicated.   budesonide-formoterol (SYMBICORT) 160-4.5 MCG/ACT inhaler Inhale 2 puffs into the lungs 2 (two) times daily. (Patient not taking: Reported on 01/11/2022)   carvedilol (COREG) 3.125 MG tablet Take 1 tablet (3.125 mg total) by mouth 2 (two) times daily with a meal.   docusate sodium (COLACE) 100 MG capsule  Take 100 mg by mouth daily.   FLUoxetine (PROZAC) 20 MG capsule Take 1 capsule (20 mg total) by mouth daily.   furosemide (LASIX) 80 MG tablet Take 80 mg by mouth 3 (three) times a week.   HYDROcodone-acetaminophen (NORCO/VICODIN) 5-325 MG tablet Take 1 tablet by mouth every 6 (six) hours as needed.   lactulose (CHRONULAC) 10 GM/15ML solution Take 30 mLs (20 g total) by mouth daily as needed for mild constipation or severe constipation.   mycophenolate (MYFORTIC) 180 MG EC tablet Take 180 mg by mouth 2 (two) times daily.   ondansetron (ZOFRAN) 4 MG tablet Take 1 tablet (4 mg total) by mouth every 8 (eight) hours as needed for up to 9 doses for nausea.   oxyCODONE (OXY IR/ROXICODONE) 5 MG immediate release tablet Take 1 tablet (5 mg total) by mouth every 6 (six) hours as needed for severe pain.   pantoprazole (PROTONIX) 20 MG tablet Take 1 tablet (20 mg total) by mouth daily.   polyethylene glycol (MIRALAX / GLYCOLAX) 17 g packet Take 17 g by mouth daily. Hold if diarrhea   potassium chloride (MICRO-K) 10 MEQ CR capsule Take 10 mEq by mouth 3 (three) times a week.   predniSONE (DELTASONE) 5 MG tablet Take 5 mg by mouth daily.   senna-docusate (SENOKOT-S) 8.6-50 MG tablet Take 1 tablet by mouth at bedtime for 14 days. Hold if diarrhea   tacrolimus (PROGRAF) 1 MG capsule Take 1 mg by mouth 2 (two) times daily.   No facility-administered encounter medications on file as of 01/22/2022.    Thank you for the opportunity to participate in the care  of Ms. Corey.  The palliative care team will continue to follow. Please call our office at [phone] if we can be of additional assistance.   Hollace Kinnier, DO  COVID-19 PATIENT SCREENING TOOL Asked and negative response unless otherwise noted:  Have you had symptoms of covid, tested positive or been in contact with someone with symptoms/positive test in the past 5-10 days? No  ADDENDUM 01/29/22:  Pt and her husband had gotten disconnected from video visit last week and were not available again until this week to reconnect due to other appts pt had.  Called and spoke with [name] and Teena Dunk again today.  Reviewed cardiology visit held since last week.  Plans are for f/u with Dr. Haroldine Laws and echo.    Mr. Pals goes back in time to when she was prescribed eliquis by her PCP, but did not take due to cost.  She was eventually given eliquis when she saw cardiology.  She took it for some time and did well.  Went back to PCP, noted irregular bp, cholesterol.  She was hospitalized again due to falls with hematoma in abdomen, severe cramps, challenges sleeping.  They determined eliquis was doing her more harm than good.    She is now off eliquis.  She is back on a baby aspirin.  They saw his PA on 7/11.    They are trying to get her to eat on a schedule b/c she lacks appetite.  It has not gone so well.  4-5 small meals per day have been recommended.  She won't even do that.  She may eat one meal.  She had 3 pieces of bacon, cup of coffee and slice of toast with grape jelly today.  Will say she's hungry, but doesn't like his cooking one minute, next she does.  Even ordered food, does not eat it all.  Ordered chicken/corn/yeast rolls/mashed potatoes with gravy/another veggie.  Ate the flat part of a wing, drank some tea and milk.  Does not like water.    She can get up out of bed when she chooses to, takes her own bath, combs hair, chooses clothes.  He helps her dress.  She does not move much.  Not  exercising.  CNA was with her and helped her walk through house once.  Is supposed to be hourly.  Doesn't feel like and can't be forced.  Had PT and could do sitting squats one set, but would not do more.    She is depressed and grieves about her grandson who passed away 2 yrs ago 11-May-2023.  Medical issues bother her.  When she does her best, she's not going forward.  She feels more satisfied when she does not do things.  She is not fighting.   She is seeing a counselor about her depression and grief.  She is also seeing Dr. Casimiro Needle early next month.  Mr. Bivens is hopeful that he can prescribe something helpful for her.    Echo reviewed from January with EF 35-40% and severe pulmonary htn.  They've discussed her code status a few times.  She tells him she loves life, but does not like how it's treated her as of late.  She told him that God gave her a command.  He mentions that he can be exhausted looking at the one he loves not doing what God commanded of her.   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  He feels like she no longer knows her purpose.  Mr. Culverhouse says his purpose is to get his spouse back on track with her life.  We reviewed the CPR process and risks of this.  He walks about how it would have been good to discuss this long ago.  Angie did not want to talk about death or purchase headstones 30 yrs ago.  She still does not like to discuss it.  Remains FULL CODE.  She says it depends on how she is at that point.  She wants to stay here b/c she has her grandkids.  If her staying is not going to help them or her, then she does not want to be here.    He says she often chooses to do nothing but stay in the bed (says b/c it's cold in there and there's a period b/w being in the bed and getting up when she "looks at more" which she doesn't like).  The grand babies wear her out quickly.    I will see them in the home on 05/01/2022 to readdress the ACP/goals of care topic.  Pt may not have the insight needed  to make the best decisions for herself any longer as her history is not consistent with what her husband shares as far as her motivation and follow-through.

## 2022-01-22 ENCOUNTER — Encounter: Payer: Self-pay | Admitting: Internal Medicine

## 2022-01-22 ENCOUNTER — Telehealth: Payer: Medicare Other | Admitting: Internal Medicine

## 2022-01-22 DIAGNOSIS — R634 Abnormal weight loss: Secondary | ICD-10-CM | POA: Diagnosis not present

## 2022-01-22 DIAGNOSIS — Z515 Encounter for palliative care: Secondary | ICD-10-CM | POA: Diagnosis not present

## 2022-01-23 ENCOUNTER — Ambulatory Visit (HOSPITAL_COMMUNITY)
Admission: RE | Admit: 2022-01-23 | Discharge: 2022-01-23 | Disposition: A | Discharge status: Home / Self Care | Payer: Medicare Other | Source: Ambulatory Visit | Attending: Family Medicine | Admitting: Family Medicine

## 2022-01-23 ENCOUNTER — Encounter (HOSPITAL_COMMUNITY): Payer: Self-pay

## 2022-01-23 VITALS — BP 112/70 | HR 76 | Wt 111.8 lb

## 2022-01-23 DIAGNOSIS — N2581 Secondary hyperparathyroidism of renal origin: Secondary | ICD-10-CM | POA: Diagnosis not present

## 2022-01-23 DIAGNOSIS — R296 Repeated falls: Secondary | ICD-10-CM | POA: Diagnosis not present

## 2022-01-23 DIAGNOSIS — Z86711 Personal history of pulmonary embolism: Secondary | ICD-10-CM | POA: Diagnosis not present

## 2022-01-23 DIAGNOSIS — D62 Acute posthemorrhagic anemia: Secondary | ICD-10-CM | POA: Diagnosis not present

## 2022-01-23 DIAGNOSIS — M1030 Gout due to renal impairment, unspecified site: Secondary | ICD-10-CM | POA: Diagnosis not present

## 2022-01-23 DIAGNOSIS — I5042 Chronic combined systolic (congestive) and diastolic (congestive) heart failure: Secondary | ICD-10-CM | POA: Diagnosis not present

## 2022-01-23 DIAGNOSIS — Z8673 Personal history of transient ischemic attack (TIA), and cerebral infarction without residual deficits: Secondary | ICD-10-CM

## 2022-01-23 DIAGNOSIS — Z940 Kidney transplant status: Secondary | ICD-10-CM | POA: Diagnosis not present

## 2022-01-23 DIAGNOSIS — N185 Chronic kidney disease, stage 5: Secondary | ICD-10-CM | POA: Diagnosis not present

## 2022-01-23 DIAGNOSIS — Z79899 Other long term (current) drug therapy: Secondary | ICD-10-CM | POA: Insufficient documentation

## 2022-01-23 DIAGNOSIS — N183 Chronic kidney disease, stage 3 unspecified: Secondary | ICD-10-CM

## 2022-01-23 DIAGNOSIS — Z86718 Personal history of other venous thrombosis and embolism: Secondary | ICD-10-CM

## 2022-01-23 DIAGNOSIS — I132 Hypertensive heart and chronic kidney disease with heart failure and with stage 5 chronic kidney disease, or end stage renal disease: Secondary | ICD-10-CM | POA: Insufficient documentation

## 2022-01-23 DIAGNOSIS — N186 End stage renal disease: Secondary | ICD-10-CM | POA: Insufficient documentation

## 2022-01-23 DIAGNOSIS — I480 Paroxysmal atrial fibrillation: Secondary | ICD-10-CM

## 2022-01-23 DIAGNOSIS — I4819 Other persistent atrial fibrillation: Secondary | ICD-10-CM | POA: Insufficient documentation

## 2022-01-23 DIAGNOSIS — D631 Anemia in chronic kidney disease: Secondary | ICD-10-CM | POA: Diagnosis not present

## 2022-01-23 LAB — BASIC METABOLIC PANEL
Anion gap: 9 (ref 5–15)
BUN: 44 mg/dL — ABNORMAL HIGH (ref 8–23)
CO2: 25 mmol/L (ref 22–32)
Calcium: 9.2 mg/dL (ref 8.9–10.3)
Chloride: 104 mmol/L (ref 98–111)
Creatinine, Ser: 2.94 mg/dL — ABNORMAL HIGH (ref 0.44–1.00)
GFR, Estimated: 16 mL/min — ABNORMAL LOW (ref >60)
Glucose, Bld: 96 mg/dL (ref 70–99)
Potassium: 3.8 mmol/L (ref 3.5–5.1)
Sodium: 138 mmol/L (ref 135–145)

## 2022-01-23 LAB — CBC
HCT: 25 % — ABNORMAL LOW (ref 36.0–46.0)
Hemoglobin: 7.6 g/dL — ABNORMAL LOW (ref 12.0–15.0)
MCH: 28.8 pg (ref 26.0–34.0)
MCHC: 30.4 g/dL (ref 30.0–36.0)
MCV: 94.7 fL (ref 80.0–100.0)
Platelets: 208 10*3/uL (ref 150–400)
RBC: 2.64 MIL/uL — ABNORMAL LOW (ref 3.87–5.11)
RDW: 18.6 % — ABNORMAL HIGH (ref 11.5–15.5)
WBC: 7.9 10*3/uL (ref 4.0–10.5)
nRBC: 0 % (ref 0.0–0.2)

## 2022-01-23 NOTE — Patient Instructions (Signed)
Remain off Eliquis  Labs today We will only contact you if something comes back abnormal or we need to make some changes. Otherwise no news is good news!  Keep cardiology follow up as scheduled with Dr Haroldine Laws  Do the following things EVERYDAY: Weigh yourself in the morning before breakfast. Write it down and keep it in a log. Take your medicines as prescribed Eat low salt foods--Limit salt (sodium) to 2000 mg per day.  Stay as active as you can everyday Limit all fluids for the day to less than 2 liters  At the [facility], you and your health needs are our priority. As part of our continuing mission to provide you with exceptional heart care, we have created designated Provider Care Teams. These Care Teams include your primary Cardiologist (physician) and Advanced Practice Providers (APPs- Physician Assistants and Nurse Practitioners) who all work together to provide you with the care you need, when you need it.   You may see any of the following providers on your designated Care Team at your next follow up: Dr Glori Bickers Dr Haynes Kerns, NP Lyda Jester, [place] [facility] [place], [place] Audry Riles, PharmD   Please be sure to bring in all your medications bottles to every appointment.

## 2022-01-23 NOTE — Progress Notes (Addendum)
ADVANCED HF CLINIC  Referring Physician: Dr. Joan Mayans Primary Cardiologist: Dr. Fransico Him & Dr. Eldred Manges [facility]) Nephrology: Dr Hollie Salk  HPI: [name] is a 76 y.o. 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 Joan Mayans), s/p LAA closure w Watchman device by Dr. Gelene Mink (09/2017), who underwent a successful closure of a crescent shaped perivalvular leak around the previously placed 33 mm watchman device with coiling system on 09/16/2018. Referred by Dr. Joan Mayans for further evaluation and local management of her HF,   Had ASD repair at 76 years of age. Denies h/o known CAD   Admitted to [facility] on 10/22/2019 for acute respiratory failure with hypoxia 2/2 COPD and acute HFpEF exacerbation. BNP 970.7. TTE revealed LVEF 55-60%, mild concentric LVH, RV volume overload, RVSP 60.7 mmHg. Severely dilated LA and RA. Dilated IVC. Marland KitchenDiuresed with IV lasix and discharged on home on Lasix 80 mg BID. Also treated with IV solumedrol, nebulizer and inhaler treatments, and IV Zithromax. Was transitioned to oral prednisone with taper at discharge and instructed to continue home Breo Ellipta and doxycycline.   Readmitted to [facility] on 11/14/2019 for GI bleeding (acute onset painless hematochezia), Hgb 7.4, requiring 2 units of pRBCs. Colonoscopy during the admission revealed diverticulosis. EGD was unremarkable. ASA was held on discharge. D/c with hemoglobin of 9.3.  Hospitalized with embolic stroke 08/5364. Previously off anticoagulants due to GI bleed. Echo 35-40% . Placed on Eliquis. Discharged to Rehab for 6 weeks.   Low dose Eliquis restarted 1/23 at post hospital follow up.  Seen in ED 12/31/21 with fall, had hematoma of R  abd wall at that time.  Hematoma appears to be stable on repeat CT on 01/09/22.  Admitted 7/23 with another fall, no hematoma but developed LLQ pain/hematoma. Found to have acute hematoma. Gen Surgery consulted, no surgical indication. Received PRBCs x 2 and IV iron. Hgb at discharge 7.8.  Today she returns for HF follow up, with her husband. Overall feeling better since being at home. She gets around her house with a cane without SOB if she takes her time, has dyspnea if she hurries. Right side is weak from her stroke. No falls or bleeding since being at home. Denies CP, dizziness, edema, or PND/Orthopnea. Appetite ok. No fever or chills. Weight at home 111 pounds. Taking all medications. Has HH PT. Husband says she is unsteady and does not get up much.  Cardiac Studies: Echo (3/21) at [facility] EF 40-45% Echo (4/21) at [facility] EF 55-60% RVSP 61 mmHG Echo (1/23): EF 35-40%    ROS: All systems reviewed and negative except as per HPI.   Past Medical History:  Diagnosis Date   Acute diastolic (congestive) heart failure ([place]) 08/12/2017   Acute GI bleeding 11/14/2019   Acute kidney injury superimposed on chronic kidney disease ([place]) 11/17/2017   AKI (acute kidney injury) ([place]) 11/04/2017   Anemia    Anxiety    ARF (acute renal failure) ([place]) 06/06/2011   Arthritis    "shoulders; arms; hips" (02/04/2018)   Asthma    Atrial fibrillation (HCC)    Atrial flutter ([place])    s/p aflutter ablation at [facility]   Bacteriuria, asymptomatic 11/14/2020   Benign hypertension with ESRD (end-stage renal disease) ([place])  Blood transfusion    never had a reaction to blood transfusion   Cardiomyopathy Mar 2013   Mild, EF 50-55% by Mar 2013 ECHO, diast dysfxn II   Cardiomyopathy ([place])    CHF (congestive heart failure) ([place]) 2005   CHF (congestive heart failure) ([place])    Childhood asthma    Closed fracture of right distal radius 10/18/2016   CMV (cytomegalovirus infection) ([place]) 10/03/2015   Constipation    takes  Miralax daily   Constipation    Depression    takes Zoloft daily   Distal radius fracture, right    DVT of lower extremity, bilateral ([place]) 12/21/11   "they're there now; been there for 2 wks"   Eczema    End stage renal disease ([place]) 11/06/2011   ESRD (end stage renal disease) on dialysis [facility]) 09/2011   07-19-2015 had Kidney transplant at [facility]; "don't get dialysis anymore" (02/04/2018)   Essential hypertension 07/07/2007   Qualifier: Diagnosis of  By: Garen Grams     Fractures, stress    in both feet--6 OR 7 YRS AGO--RESOLVED   Generalized edema [id number]   GI bleed 11/14/2019   Gout    doesn't require meds    HCAP (healthcare-associated pneumonia) 09/10/2011   Hearing difficulty    Hematoma of left lower leg 05/17/2020   High cholesterol    History of CVA (cerebrovascular accident) without residual deficits    History of hip replacement, total, right    History of pneumonia    Hx of Clostridium difficile infection    Hypercalcemia    09/10/11   Hyperparathyroidism ([place]) 04/07/2013   Hypertension    takes Diltiazem daily    Hypomagnesemia 08/02/2015   Hypophosphatemia 11/08/2017   ICB (intracranial bleed) ([place])    Immunosuppression ([place]) 07/25/2015   Memory changes    Morbid obesity ([place])    Nonischemic dilated cardiomyopathy ([place])    Obesity 04/07/2013   Oligouria    Oral mucositis 02/22/2018   OSA on CPAP    PAF (paroxysmal atrial fibrillation) (HCC)     HX OF CEREBRAL BLEED WHILE ON COUMADIN-SO PT NOT ON ANY BLOOD THINNERS NOW   Peripheral vascular disease ([place])    Persistent atrial fibrillation ([place]) 08/12/2017   Overview:  Added automatically from request for surgery [phone]  Formatting of this note might be different from the original. Added automatically from request for surgery [id number]   Presence of Watchman left atrial appendage closure device 10/04/2017   Proteinuria 09/04/2016   Psoriasis 08/07/2016   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/25/2015   S/P insertion of  IVC (inferior vena caval) filter    Sepsis ([place]) 11/08/2017   Stress fracture    bilateral feet   Stroke ([place]) 2009   denies residual on 02/04/2018;  hemorrhagic now off coumadin   Stroke ([place]) 07/17/2007   denies residual, hemorrhagic now off coumadin   Stroke due to intracerebral hemorrhage ([place]) 2009   Suture reaction 09/04/2016   Transfusion history    Use of cane as ambulatory aid    Current Outpatient Medications  Medication Sig Dispense Refill   acetaminophen (TYLENOL) 500 MG tablet Take 500 mg by mouth every 6 (six) hours as needed for mild pain or headache.      albuterol (PROVENTIL) (2.5 MG/3ML) 0.083% nebulizer solution Take 3 mLs (2.5 mg total) by nebulization every 6 (six) hours as needed for wheezing or shortness of breath. Dx:J45.909 150 mL 2   albuterol (VENTOLIN HFA) 108 (90 Base) MCG/ACT inhaler INHALE 1-2  PUFFS BY MOUTH EVERY 6 HOURS AS NEEDED FOR WHEEZE OR SHORTNESS OF BREATH 8.5 each 1   budesonide-formoterol (SYMBICORT) 160-4.5 MCG/ACT inhaler Inhale 2 puffs into the lungs 2 (two) times daily. 1 each 5   carvedilol (COREG) 3.125 MG tablet Take 1 tablet (3.125 mg total) by mouth 2 (two) times daily with a meal. 180 tablet 3   docusate sodium (COLACE) 100 MG capsule Take 100 mg by mouth daily.     FLUoxetine (PROZAC) 20 MG capsule Take 1 capsule (20 mg total) by mouth daily. 90 capsule 1   furosemide (LASIX) 80 MG tablet Take 80 mg by mouth 3 (three) times a week.     HYDROcodone-acetaminophen (NORCO/VICODIN) 5-325 MG tablet Take 1 tablet by mouth every 6 (six) hours as needed. 12 tablet 0   lactulose (CHRONULAC) 10 GM/15ML solution Take 30 mLs (20 g total) by mouth daily as needed for mild constipation or severe constipation. 236 mL 0   mycophenolate (MYFORTIC) 180 MG EC tablet Take 180 mg by mouth 2 (two) times daily.     ondansetron (ZOFRAN) 4 MG tablet Take 1 tablet (4 mg total) by mouth every 8 (eight) hours as needed for up to 9 doses for nausea. 9 tablet 0    oxyCODONE (OXY IR/ROXICODONE) 5 MG immediate release tablet Take 1 tablet (5 mg total) by mouth every 6 (six) hours as needed for severe pain. 10 tablet 0   pantoprazole (PROTONIX) 20 MG tablet Take 1 tablet (20 mg total) by mouth daily. 90 tablet 0   polyethylene glycol (MIRALAX / GLYCOLAX) 17 g packet Take 17 g by mouth daily. Hold if diarrhea 14 each 0   potassium chloride (MICRO-K) 10 MEQ CR capsule Take 10 mEq by mouth 3 (three) times a week.     predniSONE (DELTASONE) 5 MG tablet Take 5 mg by mouth daily.     senna-docusate (SENOKOT-S) 8.6-50 MG tablet Take 1 tablet by mouth at bedtime for 14 days. Hold if diarrhea 14 tablet 0   tacrolimus (PROGRAF) 1 MG capsule Take 1 mg by mouth 2 (two) times daily.     No current facility-administered medications for this encounter.   Allergies  Allergen Reactions   Warfarin Sodium Other (See Comments)    Caused her to have a stroke/left side of brain to hemorrhage    Ace Inhibitors Cough   Amiodarone Other (See Comments)    Pt with Restrictive Lung Disease by 10/2017 PFT with decreased DLCO   Amlodipine Swelling    Swelling in feet   Social History   Socioeconomic History   Marital status: Married    Spouse name: Not on file   Number of children: Not on file   Years of education: Not on file   Highest education level: Not on file  Occupational History   Occupation: retired  Tobacco Use   Smoking status: Never   Smokeless tobacco: Never  Vaping Use   Vaping Use: Never used  Substance and Sexual Activity   Alcohol use: Never   Drug use: Never   Sexual activity: Yes    Birth control/protection: Post-menopausal  Other Topics Concern   Not on file  Social History Narrative   Married; retired; lives in [place] Strain: [place]  (12/05/2021)   Overall Financial Resource Strain ([place])    Difficulty of Paying Living Expenses: Not hard at all  Food Insecurity: No [place]  (12/15/2021)   Hunger  Vital Sign    Worried About Charity fundraiser in the Last Year: Never true    Ran Out of Food in the Last Year: Never true  Transportation Needs: No Transportation Needs (12/13/2021)   PRAPARE - Hydrologist (Medical): No    Lack of Transportation (Non-Medical): No  Physical Activity: Inactive (12/05/2021)   Exercise Vital Sign    Days of Exercise per Week: 0 days    Minutes of Exercise per Session: 0 min  Stress: No Stress Concern Present (12/05/2021)   [place]    Feeling of Stress : Not at all  Social Connections: [place] (12/05/2021)   Social Connection and Isolation Panel [NHANES]    Frequency of Communication with Friends and Family: Twice a week    Frequency of Social Gatherings with Friends and Family: Twice a week    Attends Religious Services: More than 4 times per year    Active Member of [employer] or Organizations: Yes    Attends Archivist Meetings: 1 to 4 times per year    Marital Status: Married  Human resources officer Violence: Not At Risk (12/05/2021)   Humiliation, Afraid, Rape, and Kick questionnaire    Fear of Current or Ex-Partner: No    Emotionally Abused: No    Physically Abused: No    Sexually Abused: No   Family History  Problem Relation Age of Onset   Cancer Father    Diabetes Mother    Hypertension Mother    Arthritis Mother    Hodgkin's lymphoma Other 44       dscd---HODGKINS DISEASE   Hypertension Brother    BP 112/70   Pulse 76   Wt 50.7 kg (111 lb 12.8 oz)   SpO2 98%   BMI 18.60 kg/m   Wt Readings from Last 3 Encounters:  01/23/22 50.7 kg (111 lb 12.8 oz)  01/11/22 47.6 kg (105 lb)  12/25/21 51.3 kg (113 lb 3.2 oz)   PHYSICAL EXAM: General:  NAD. No resp difficulty, arrived in [facility], frail HEENT: Normal Neck: Supple. No JVD. Carotids 2+ bilat; no bruits. No lymphadenopathy or thryomegaly appreciated. Cor: PMI  nondisplaced. Regular rate & rhythm. No rubs, gallops or murmurs. Lungs: Clear Abdomen: Soft, nontender, nondistended. No hepatosplenomegaly. No bruits or masses. Good bowel sounds. Extremities: No cyanosis, clubbing, rash, trace BLE edema, L>R Neuro: Alert & oriented x 3, cranial nerves grossly intact. Moves all 4 extremities w/o difficulty. Affect pleasant.  ECG (personally reviewed): v paced, 70 bpm  ASSESSMENT & PLAN: 1. Chronic combined systolic?diastolic HF - Echo 8/34 at [facility] EF 40-45% - Echo 4/21. EF 55-60% - Echo 1/23 35-40%  - transplanted kidney and CKD. - NYHA III-IIIb, limited by stroke and anemia. Volume looks OK today. - Continue carvedilol 3.125 mg bid. - Continue Lasix 80 mg 3x/week. - No room to add Bidil, keep SBP > 120 - Would avoid SGLT2i with incontinence - Check ECHO next visit. - Labs today.   2. Chronic AF - s/p AVN ablation and pacing with single lead device -> ? Need to upgrade to CRT. Follows with EP at [facility] - s/p Watchman device.  - Failed warfarin due to remote hemorrhagic CVA in 2010. - Re-challenged with low dose Eliquis 1/23, now stopped with # 6 (see below)  3. ESRD s/p renal transplant -> now CKD 3b - Followed by Dr Royce Macadamia . - BMET today.   4. H/o DVT/PE -  s/p IVC filter.  5. CVA, emoblic 02/2422 - Started back on Eliquis 2.5 (1/23) after acute CVA, despite Watchman. - Now off Eliquis with ABLA 2/2 fall & abdominal hematoma. - Stop Eliquis. Discussed with Dr. Haroldine Laws  6. FTT/Frequent Falls - She is unsteady. Working with PT at home. - Followed by Palliative Care in the community. - Stop Eliquis as above.  Follow up next month with Dr. Haroldine Laws + echo, as scheduled.  Rafael Bihari, FNP  3:47 PM

## 2022-01-23 NOTE — Addendum Note (Signed)
Encounter addended by: Rafael Bihari, FNP on: 01/23/2022 4:51 PM  Actions taken: Clinical Note Signed

## 2022-01-25 DIAGNOSIS — N184 Chronic kidney disease, stage 4 (severe): Secondary | ICD-10-CM | POA: Diagnosis not present

## 2022-01-25 DIAGNOSIS — I129 Hypertensive chronic kidney disease with stage 1 through stage 4 chronic kidney disease, or unspecified chronic kidney disease: Secondary | ICD-10-CM | POA: Diagnosis not present

## 2022-01-25 DIAGNOSIS — R809 Proteinuria, unspecified: Secondary | ICD-10-CM | POA: Diagnosis not present

## 2022-01-25 DIAGNOSIS — R894 Abnormal immunological findings in specimens from other organs, systems and tissues: Secondary | ICD-10-CM | POA: Diagnosis not present

## 2022-01-25 DIAGNOSIS — N281 Cyst of kidney, acquired: Secondary | ICD-10-CM | POA: Diagnosis not present

## 2022-01-25 DIAGNOSIS — N2581 Secondary hyperparathyroidism of renal origin: Secondary | ICD-10-CM | POA: Diagnosis not present

## 2022-01-25 DIAGNOSIS — Z940 Kidney transplant status: Secondary | ICD-10-CM | POA: Diagnosis not present

## 2022-01-25 DIAGNOSIS — E876 Hypokalemia: Secondary | ICD-10-CM | POA: Diagnosis not present

## 2022-01-25 DIAGNOSIS — D631 Anemia in chronic kidney disease: Secondary | ICD-10-CM | POA: Diagnosis not present

## 2022-01-25 DIAGNOSIS — K922 Gastrointestinal hemorrhage, unspecified: Secondary | ICD-10-CM | POA: Diagnosis not present

## 2022-01-25 DIAGNOSIS — D849 Immunodeficiency, unspecified: Secondary | ICD-10-CM | POA: Diagnosis not present

## 2022-01-25 DIAGNOSIS — E872 Acidosis, unspecified: Secondary | ICD-10-CM | POA: Diagnosis not present

## 2022-01-26 DIAGNOSIS — I5042 Chronic combined systolic (congestive) and diastolic (congestive) heart failure: Secondary | ICD-10-CM | POA: Diagnosis not present

## 2022-01-26 DIAGNOSIS — N2581 Secondary hyperparathyroidism of renal origin: Secondary | ICD-10-CM | POA: Diagnosis not present

## 2022-01-26 DIAGNOSIS — D631 Anemia in chronic kidney disease: Secondary | ICD-10-CM | POA: Diagnosis not present

## 2022-01-26 DIAGNOSIS — M1030 Gout due to renal impairment, unspecified site: Secondary | ICD-10-CM | POA: Diagnosis not present

## 2022-01-26 DIAGNOSIS — N185 Chronic kidney disease, stage 5: Secondary | ICD-10-CM | POA: Diagnosis not present

## 2022-01-26 DIAGNOSIS — I132 Hypertensive heart and chronic kidney disease with heart failure and with stage 5 chronic kidney disease, or end stage renal disease: Secondary | ICD-10-CM | POA: Diagnosis not present

## 2022-01-26 DIAGNOSIS — N184 Chronic kidney disease, stage 4 (severe): Secondary | ICD-10-CM | POA: Diagnosis not present

## 2022-01-29 DIAGNOSIS — M150 Primary generalized (osteo)arthritis: Secondary | ICD-10-CM | POA: Diagnosis not present

## 2022-01-29 DIAGNOSIS — I69351 Hemiplegia and hemiparesis following cerebral infarction affecting right dominant side: Secondary | ICD-10-CM | POA: Diagnosis not present

## 2022-01-29 DIAGNOSIS — K2210 Ulcer of esophagus without bleeding: Secondary | ICD-10-CM | POA: Diagnosis not present

## 2022-01-29 DIAGNOSIS — D849 Immunodeficiency, unspecified: Secondary | ICD-10-CM | POA: Diagnosis not present

## 2022-01-29 DIAGNOSIS — I5042 Chronic combined systolic (congestive) and diastolic (congestive) heart failure: Secondary | ICD-10-CM | POA: Diagnosis not present

## 2022-01-29 DIAGNOSIS — Z7952 Long term (current) use of systemic steroids: Secondary | ICD-10-CM | POA: Diagnosis not present

## 2022-01-29 DIAGNOSIS — I739 Peripheral vascular disease, unspecified: Secondary | ICD-10-CM | POA: Diagnosis not present

## 2022-01-29 DIAGNOSIS — Z7901 Long term (current) use of anticoagulants: Secondary | ICD-10-CM | POA: Diagnosis not present

## 2022-01-29 DIAGNOSIS — D631 Anemia in chronic kidney disease: Secondary | ICD-10-CM | POA: Diagnosis not present

## 2022-01-29 DIAGNOSIS — N2581 Secondary hyperparathyroidism of renal origin: Secondary | ICD-10-CM | POA: Diagnosis not present

## 2022-01-29 DIAGNOSIS — J45909 Unspecified asthma, uncomplicated: Secondary | ICD-10-CM | POA: Diagnosis not present

## 2022-01-29 DIAGNOSIS — G4733 Obstructive sleep apnea (adult) (pediatric): Secondary | ICD-10-CM | POA: Diagnosis not present

## 2022-01-29 DIAGNOSIS — Z7951 Long term (current) use of inhaled steroids: Secondary | ICD-10-CM | POA: Diagnosis not present

## 2022-01-29 DIAGNOSIS — E46 Unspecified protein-calorie malnutrition: Secondary | ICD-10-CM | POA: Diagnosis not present

## 2022-01-29 DIAGNOSIS — H9190 Unspecified hearing loss, unspecified ear: Secondary | ICD-10-CM | POA: Diagnosis not present

## 2022-01-29 DIAGNOSIS — I132 Hypertensive heart and chronic kidney disease with heart failure and with stage 5 chronic kidney disease, or end stage renal disease: Secondary | ICD-10-CM | POA: Diagnosis not present

## 2022-01-29 DIAGNOSIS — E7800 Pure hypercholesterolemia, unspecified: Secondary | ICD-10-CM | POA: Diagnosis not present

## 2022-01-29 DIAGNOSIS — M1030 Gout due to renal impairment, unspecified site: Secondary | ICD-10-CM | POA: Diagnosis not present

## 2022-01-29 DIAGNOSIS — F419 Anxiety disorder, unspecified: Secondary | ICD-10-CM | POA: Diagnosis not present

## 2022-01-29 DIAGNOSIS — N185 Chronic kidney disease, stage 5: Secondary | ICD-10-CM | POA: Diagnosis not present

## 2022-01-29 DIAGNOSIS — I428 Other cardiomyopathies: Secondary | ICD-10-CM | POA: Diagnosis not present

## 2022-01-29 DIAGNOSIS — F330 Major depressive disorder, recurrent, mild: Secondary | ICD-10-CM | POA: Diagnosis not present

## 2022-01-29 DIAGNOSIS — Z9181 History of falling: Secondary | ICD-10-CM | POA: Diagnosis not present

## 2022-01-29 DIAGNOSIS — Z86718 Personal history of other venous thrombosis and embolism: Secondary | ICD-10-CM | POA: Diagnosis not present

## 2022-01-29 DIAGNOSIS — I4819 Other persistent atrial fibrillation: Secondary | ICD-10-CM | POA: Diagnosis not present

## 2022-01-30 ENCOUNTER — Telehealth: Payer: Self-pay

## 2022-01-30 ENCOUNTER — Ambulatory Visit (INDEPENDENT_AMBULATORY_CARE_PROVIDER_SITE_OTHER): Payer: Medicare Other | Admitting: Psychology

## 2022-01-30 DIAGNOSIS — F331 Major depressive disorder, recurrent, moderate: Secondary | ICD-10-CM | POA: Diagnosis not present

## 2022-01-30 NOTE — Progress Notes (Signed)
[facility] Counselor/Therapist Progress Note  Patient ID: [name], MRN: [id number],    Date:01/30/2022  Time Spent: 60 minutes  Treatment Type: Individual Therapy  Reported Symptoms: depression, decreased motivation  Mental Status Exam: Appearance:  Casual     Behavior: Appropriate  Motor: Using walker today  Speech/Language:  WNL  Affect: Blunt  Mood: normal  Thought process: normal  Thought content:   WNL  Sensory/Perceptual disturbances:   WNL  Orientation: oriented to person, place, time/date, and situation  Attention: Good  Concentration: Good  Memory: Recent;   [place] of knowledge:  Good  Insight:   Fair  Judgment:  Good  Impulse Control: Good   Risk Assessment: Danger to Self:  No Self-injurious Behavior: No Danger to Others: No Duty to Warn:no Physical Aggression / Violence:No  Access to Firearms a concern: No  Gang Involvement:No   Subjective: The patient attended a face to face individual therapy session.  The patient presents as pleasant and cooperative.  The patient reports that she feels like she is doing better than she was the last time I saw her.  The patient was walking with a walker today and seemed to have more energy than she had the last time.  The patient states that she does feel like her depression is getting better.  We talked today about her thinking about her mortality and we talked about the need to focus on as much positive things that she can and do what she can to enjoy her life.  The patient is planning to go to the beach with her daughter and granddaughters and husband over the coming weekend and I gave her some positive feedback for trying to do some of those things as she would be more depressed if she would stay home and not do those.  The patient also has gained some weight and it seems that she is feeling better because she is better nourished.    Interventions: Cognitive Behavioral Therapy and  Solution-Oriented/Positive Psychology  Diagnosis:Major depressive disorder, recurrent episode, moderate ([place])  Plan: Client Abilities/Strengths  Intelligent, supportive family  Client Treatment Preferences  Outpatient Individual therapy  Client Statement of Needs  "I am depressed because I had a stroke"  Treatment Level  Outpatient Individual therapy  Symptoms  Decrease or loss of appetite.: No Description Entered (Status: maintained). Depressed or irritable  mood.: No Description Entered (Status: maintained). Diminished interest in or enjoyment of activities.:  No Description Entered (Status: maintained). Lack of energy.: No Description Entered (Status:  maintained). Social withdrawal.: No Description Entered (Status: maintained).  Problems Addressed  Unipolar Depression, Unipolar Depression  Goals 1. Alleviate depressive symptoms and return to previous level of effective  functioning. Objective Learn and implement behavioral strategies to overcome depression. Target Date: 2022-10-19 Frequency: Biweekly Progress: 0 Modality: individual  Related Interventions 1. Assist the client in developing skills that increase the likelihood of deriving pleasure from  behavioral activation (e.g., assertiveness skills, developing an exercise plan, less internal/more  external focus, increased social involvement); reinforce success. Objective Identify and replace thoughts and beliefs that support depression. Target Date: 2022-10-19 Frequency: Biweekly Progress: 0 Modality: individual Related Interventions 1. Conduct Cognitive-Behavioral Therapy (see Cognitive Behavior Therapy by Olevia Bowens; Overcoming Depression by Lynita Lombard al.), beginning with helping the client learn the connection among  cognition, depressive feelings, and actions. 2. Develop healthy thinking patterns and beliefs about self, others, and the world that lead to the alleviation and help prevent the relapse of  depression. Diagnosis 296.22 (Major depressive affective disorder, single episode, moderate) - Open -  [Signifier: n/a]  Conditions For Discharge Achievement of treatment goals and objectives   Kitrina Maurin G Randen Kauth, LCSW                 Kirstin Kugler G Armoni Depass, LCSW               Konner Warrior G Immaculate Crutcher, LCSW

## 2022-01-30 NOTE — Progress Notes (Signed)
Chronic Care Management Pharmacy Assistant   Name: [name]  MRN: [id number] DOB: January 06, 1946  Reason for Encounter:Hypertension Disease State Call.  Recent office visits:  01/11/2022 Wilfred Lacy NP (PCP) No Medication Changes noted 12/25/2021 Dr. Grandville Silos MD (PCP Office) No Medication Changes noted  Recent consult visits:  01/23/2022 Cardiology No Medication Changes noted 01/22/2022 Tiffany Reed DO [facility] Palliative) No Medication Changes noted 01/11/2022 Bambi Cottle LCSW (Behavioral Health) Unable to see note  Hospital visits:  Medication Reconciliation was completed by comparing discharge summary, patient's EMR and Pharmacy list, and upon discussion with patient.  Admitted to the hospital on 01/19/2022 due to Anemia  Discharge date was 01/20/2022. Discharged from  [place]?Medications Started at [facility] Discharge:?? -started None ID  Medication Changes at Hospital Discharge: -Changed None ID  Medications Discontinued at Hospital Discharge: -Stopped Eliquis  Medications that remain the same after Hospital Discharge:??  -All other medications will remain the same.    Admitted to the hospital on 12/31/2021 due to Abdominal Wall hematoma  Discharge date was 12/31/2021. Discharged from [place]?Medications Started at [facility] Discharge:?? -started Hydrocodone-Acetaminophen 5-325 mg PRN  Medication Changes at Hospital Discharge: -Changed None ID  Medications Discontinued at Hospital Discharge: -Stopped None ID  Medications that remain the same after Hospital Discharge:??  -All other medications will remain the same.    Medications: Outpatient Encounter Medications as of 01/30/2022  Medication Sig Note   acetaminophen (TYLENOL) 500 MG tablet Take 500 mg by mouth every 6 (six) hours as needed for mild pain or headache.     albuterol (PROVENTIL) (2.5 MG/3ML) 0.083% nebulizer solution Take 3 mLs (2.5 mg  total) by nebulization every 6 (six) hours as needed for wheezing or shortness of breath. Dx:J45.909    albuterol (VENTOLIN HFA) 108 (90 Base) MCG/ACT inhaler INHALE 1-2 PUFFS BY MOUTH EVERY 6 HOURS AS NEEDED FOR WHEEZE OR SHORTNESS OF BREATH    budesonide-formoterol (SYMBICORT) 160-4.5 MCG/ACT inhaler Inhale 2 puffs into the lungs 2 (two) times daily. 12/13/2021: Patient states she takes only as needed.    carvedilol (COREG) 3.125 MG tablet Take 1 tablet (3.125 mg total) by mouth 2 (two) times daily with a meal.    docusate sodium (COLACE) 100 MG capsule Take 100 mg by mouth daily.    FLUoxetine (PROZAC) 20 MG capsule Take 1 capsule (20 mg total) by mouth daily.    furosemide (LASIX) 80 MG tablet Take 80 mg by mouth 3 (three) times a week.    HYDROcodone-acetaminophen (NORCO/VICODIN) 5-325 MG tablet Take 1 tablet by mouth every 6 (six) hours as needed.    lactulose (CHRONULAC) 10 GM/15ML solution Take 30 mLs (20 g total) by mouth daily as needed for mild constipation or severe constipation.    mycophenolate (MYFORTIC) 180 MG EC tablet Take 180 mg by mouth 2 (two) times daily.    ondansetron (ZOFRAN) 4 MG tablet Take 1 tablet (4 mg total) by mouth every 8 (eight) hours as needed for up to 9 doses for nausea.    oxyCODONE (OXY IR/ROXICODONE) 5 MG immediate release tablet Take 1 tablet (5 mg total) by mouth every 6 (six) hours as needed for severe pain.    pantoprazole (PROTONIX) 20 MG tablet Take 1 tablet (20 mg total) by mouth daily.    polyethylene glycol (MIRALAX / GLYCOLAX) 17 g packet Take 17 g by mouth daily. Hold if diarrhea    potassium chloride (MICRO-K) 10  MEQ CR capsule Take 10 mEq by mouth 3 (three) times a week.    predniSONE (DELTASONE) 5 MG tablet Take 5 mg by mouth daily.    senna-docusate (SENOKOT-S) 8.6-50 MG tablet Take 1 tablet by mouth at bedtime for 14 days. Hold if diarrhea    tacrolimus (PROGRAF) 1 MG capsule Take 1 mg by mouth 2 (two) times daily.    No facility-administered  encounter medications on file as of 01/30/2022.    Care Gaps: COVID-19 Vaccine (4- Booster)   Star Rating Drugs: Atorvastatin 20 mg last filled 11/23/2021 90 day supply at [facility].   Medication Fill Gaps: None  Reviewed chart prior to disease state call. Spoke with patient regarding BP  Recent Office Vitals: BP Readings from Last 3 Encounters:  01/23/22 112/70  01/20/22 120/74  01/11/22 122/72   Pulse Readings from Last 3 Encounters:  01/23/22 76  01/20/22 76  01/11/22 69    Wt Readings from Last 3 Encounters:  01/23/22 111 lb 12.8 oz (50.7 kg)  01/11/22 105 lb (47.6 kg)  12/25/21 113 lb 3.2 oz (51.3 kg)     Kidney Function Lab Results  Component Value Date/Time   CREATININE 2.94 (H) 01/23/2022 04:10 PM   CREATININE 3.03 (H) 01/20/2022 07:42 AM   GFR 13.63 (LL) 07/21/2021 12:01 PM   GFRNONAA 16 (L) 01/23/2022 04:10 PM   GFRAA 24 (L) 03/13/2020 11:18 AM       Latest Ref Rng & Units 01/23/2022    4:10 PM 01/20/2022    7:42 AM 01/19/2022    2:08 AM  BMP  Glucose 70 - 99 mg/dL 96  96  138   BUN 8 - 23 mg/dL 44  43  44   Creatinine 0.44 - 1.00 mg/dL 2.94  3.03  3.14   Sodium 135 - 145 mmol/L 138  137  139   Potassium 3.5 - 5.1 mmol/L 3.8  3.7  3.6   Chloride 98 - 111 mmol/L 104  104  105   CO2 22 - 32 mmol/L '25  24  25   '$ Calcium 8.9 - 10.3 mg/dL 9.2  8.8  9.0     Current antihypertensive regimen:  Carvedilol 3.125 mg twice daily  Furosemide 80 mg three times weekly  How often are you checking your Blood Pressure? daily  Current home BP readings:  Patient spouse states her blood pressure has been ranging from 112-122/70-74.  What recent interventions/DTPs have been made by any provider to improve Blood Pressure control since last CPP Visit: None ID  Any recent hospitalizations or ED visits since last visit with CPP? No  What diet changes have been made to improve Blood Pressure Control?  Patient spouse states her appetite has improved some since he has  spoken with me.  What exercise is being done to improve your Blood Pressure Control?  None ID  Adherence Review: Is the patient currently on ACE/ARB medication? No Does the patient have >5 day gap between last estimated fill dates? No   Anderson Malta Clinical Production designer, theatre/television/film [phone]

## 2022-01-31 DIAGNOSIS — N185 Chronic kidney disease, stage 5: Secondary | ICD-10-CM | POA: Diagnosis not present

## 2022-01-31 DIAGNOSIS — I132 Hypertensive heart and chronic kidney disease with heart failure and with stage 5 chronic kidney disease, or end stage renal disease: Secondary | ICD-10-CM | POA: Diagnosis not present

## 2022-01-31 DIAGNOSIS — M1030 Gout due to renal impairment, unspecified site: Secondary | ICD-10-CM | POA: Diagnosis not present

## 2022-01-31 DIAGNOSIS — N2581 Secondary hyperparathyroidism of renal origin: Secondary | ICD-10-CM | POA: Diagnosis not present

## 2022-01-31 DIAGNOSIS — I5042 Chronic combined systolic (congestive) and diastolic (congestive) heart failure: Secondary | ICD-10-CM | POA: Diagnosis not present

## 2022-01-31 DIAGNOSIS — D631 Anemia in chronic kidney disease: Secondary | ICD-10-CM | POA: Diagnosis not present

## 2022-02-01 DIAGNOSIS — D631 Anemia in chronic kidney disease: Secondary | ICD-10-CM | POA: Diagnosis not present

## 2022-02-01 DIAGNOSIS — I132 Hypertensive heart and chronic kidney disease with heart failure and with stage 5 chronic kidney disease, or end stage renal disease: Secondary | ICD-10-CM | POA: Diagnosis not present

## 2022-02-01 DIAGNOSIS — I5042 Chronic combined systolic (congestive) and diastolic (congestive) heart failure: Secondary | ICD-10-CM | POA: Diagnosis not present

## 2022-02-01 DIAGNOSIS — M1030 Gout due to renal impairment, unspecified site: Secondary | ICD-10-CM | POA: Diagnosis not present

## 2022-02-01 DIAGNOSIS — N2581 Secondary hyperparathyroidism of renal origin: Secondary | ICD-10-CM | POA: Diagnosis not present

## 2022-02-01 DIAGNOSIS — N185 Chronic kidney disease, stage 5: Secondary | ICD-10-CM | POA: Diagnosis not present

## 2022-02-02 ENCOUNTER — Encounter (HOSPITAL_COMMUNITY): Payer: Medicare Other

## 2022-02-06 DIAGNOSIS — M1030 Gout due to renal impairment, unspecified site: Secondary | ICD-10-CM | POA: Diagnosis not present

## 2022-02-06 DIAGNOSIS — D631 Anemia in chronic kidney disease: Secondary | ICD-10-CM | POA: Diagnosis not present

## 2022-02-06 DIAGNOSIS — N2581 Secondary hyperparathyroidism of renal origin: Secondary | ICD-10-CM | POA: Diagnosis not present

## 2022-02-06 DIAGNOSIS — I5042 Chronic combined systolic (congestive) and diastolic (congestive) heart failure: Secondary | ICD-10-CM | POA: Diagnosis not present

## 2022-02-06 DIAGNOSIS — I132 Hypertensive heart and chronic kidney disease with heart failure and with stage 5 chronic kidney disease, or end stage renal disease: Secondary | ICD-10-CM | POA: Diagnosis not present

## 2022-02-06 DIAGNOSIS — N185 Chronic kidney disease, stage 5: Secondary | ICD-10-CM | POA: Diagnosis not present

## 2022-02-07 ENCOUNTER — Ambulatory Visit (HOSPITAL_COMMUNITY)
Admission: RE | Admit: 2022-02-07 | Discharge: 2022-02-07 | Disposition: A | Discharge status: Home / Self Care | Payer: Medicare Other | Source: Ambulatory Visit | Attending: Nephrology | Admitting: Nephrology

## 2022-02-07 VITALS — BP 138/68 | HR 70 | Temp 97.4°F | Resp 17

## 2022-02-07 DIAGNOSIS — I5042 Chronic combined systolic (congestive) and diastolic (congestive) heart failure: Secondary | ICD-10-CM | POA: Diagnosis not present

## 2022-02-07 DIAGNOSIS — D631 Anemia in chronic kidney disease: Secondary | ICD-10-CM | POA: Insufficient documentation

## 2022-02-07 DIAGNOSIS — M1030 Gout due to renal impairment, unspecified site: Secondary | ICD-10-CM | POA: Diagnosis not present

## 2022-02-07 DIAGNOSIS — N185 Chronic kidney disease, stage 5: Secondary | ICD-10-CM | POA: Insufficient documentation

## 2022-02-07 DIAGNOSIS — I132 Hypertensive heart and chronic kidney disease with heart failure and with stage 5 chronic kidney disease, or end stage renal disease: Secondary | ICD-10-CM | POA: Diagnosis not present

## 2022-02-07 DIAGNOSIS — N2581 Secondary hyperparathyroidism of renal origin: Secondary | ICD-10-CM | POA: Diagnosis not present

## 2022-02-07 LAB — IRON AND TIBC
Iron: 68 ug/dL (ref 28–170)
Saturation Ratios: 41 % — ABNORMAL HIGH (ref 10.4–31.8)
TIBC: 168 ug/dL — ABNORMAL LOW (ref 250–450)
UIBC: 100 ug/dL

## 2022-02-07 LAB — POCT HEMOGLOBIN-HEMACUE: Hemoglobin: 7.8 g/dL — ABNORMAL LOW (ref 12.0–15.0)

## 2022-02-07 LAB — FERRITIN: Ferritin: 620 ng/mL — ABNORMAL HIGH (ref 11–307)

## 2022-02-07 MED ORDER — EPOETIN ALFA 20000 UNIT/ML IJ SOLN
20000.0000 [IU] | Freq: Once | INTRAMUSCULAR | Status: AC
  Administered 2022-02-07: 20000 [IU] via SUBCUTANEOUS

## 2022-02-07 MED ORDER — EPOETIN ALFA-EPBX 40000 UNIT/ML IJ SOLN: 20000.0000 [IU] | INTRAMUSCULAR | Status: DC

## 2022-02-07 MED ORDER — EPOETIN ALFA 20000 UNIT/ML IJ SOLN
INTRAMUSCULAR | Status: AC
  Filled 2022-02-07: qty 1

## 2022-02-07 NOTE — Progress Notes (Signed)
[facility] office,JoAnne, notified of pt's hemocue of 7.8. No new orders received. Proceeded with ordered plan

## 2022-02-08 ENCOUNTER — Ambulatory Visit (INDEPENDENT_AMBULATORY_CARE_PROVIDER_SITE_OTHER): Payer: Medicare Other

## 2022-02-08 ENCOUNTER — Encounter: Payer: Self-pay | Admitting: Nurse Practitioner

## 2022-02-08 ENCOUNTER — Ambulatory Visit: Payer: Medicare Other | Admitting: Psychology

## 2022-02-08 ENCOUNTER — Ambulatory Visit: Payer: Medicare Other | Admitting: Nurse Practitioner

## 2022-02-08 ENCOUNTER — Ambulatory Visit (INDEPENDENT_AMBULATORY_CARE_PROVIDER_SITE_OTHER): Payer: Medicare Other | Admitting: Nurse Practitioner

## 2022-02-08 VITALS — BP 144/80 | HR 69 | Temp 98.3°F | Ht 65.0 in | Wt 111.2 lb

## 2022-02-08 DIAGNOSIS — R0609 Other forms of dyspnea: Secondary | ICD-10-CM | POA: Diagnosis not present

## 2022-02-08 DIAGNOSIS — I5043 Acute on chronic combined systolic (congestive) and diastolic (congestive) heart failure: Secondary | ICD-10-CM

## 2022-02-08 DIAGNOSIS — R0600 Dyspnea, unspecified: Secondary | ICD-10-CM | POA: Diagnosis not present

## 2022-02-08 NOTE — Progress Notes (Signed)
Established Patient Visit  Patient: [name]   DOB: 12-23-1945   76 y.o. Female  MRN: [id number] Visit Date: 02/08/2022  Subjective:    Chief Complaint  Patient presents with   Office Visit    C/o asthma x 1 week, has not cleared up even with the nebulizer treatments & mucinex.  Hospital F/u 01/19/2022   Accompanied by husband Cardiology Team: Dr. Joan Mayans, Dr, Radford Pax and Dr. Missy Sabins Nephrology: Dr. Mercy Moore.  Shortness of Breath This is a new problem. The current episode started in the past 7 days. The problem occurs constantly. The problem has been unchanged. Associated symptoms include leg swelling, orthopnea and PND. Pertinent negatives include no abdominal pain, chest pain, sputum production or syncope. The symptoms are aggravated by lying flat and any activity. She has tried beta agonist inhalers for the symptoms. The treatment provided no relief. Her past medical history is significant for asthma and CAD.  Weight gain of 6lbs in last 25month  Reviewed medical, surgical, and social history today  Medications: Outpatient Medications Prior to Visit  Medication Sig   acetaminophen (TYLENOL) 500 MG tablet Take 500 mg by mouth every 6 (six) hours as needed for mild pain or headache.    albuterol (PROVENTIL) (2.5 MG/3ML) 0.083% nebulizer solution Take 3 mLs (2.5 mg total) by nebulization every 6 (six) hours as needed for wheezing or shortness of breath. Dx:J45.909   albuterol (VENTOLIN HFA) 108 (90 Base) MCG/ACT inhaler INHALE 1-2 PUFFS BY MOUTH EVERY 6 HOURS AS NEEDED FOR WHEEZE OR SHORTNESS OF BREATH   budesonide-formoterol (SYMBICORT) 160-4.5 MCG/ACT inhaler Inhale 2 puffs into the lungs 2 (two) times daily.   carvedilol (COREG) 3.125 MG tablet Take 1 tablet (3.125 mg total) by mouth 2 (two) times daily with a meal.   docusate sodium (COLACE) 100 MG capsule Take 100 mg by mouth daily.   FLUoxetine (PROZAC) 20 MG capsule Take 1 capsule (20 mg total) by mouth daily.    furosemide (LASIX) 80 MG tablet Take 80 mg by mouth 3 (three) times a week.   HYDROcodone-acetaminophen (NORCO/VICODIN) 5-325 MG tablet Take 1 tablet by mouth every 6 (six) hours as needed.   lactulose (CHRONULAC) 10 GM/15ML solution Take 30 mLs (20 g total) by mouth daily as needed for mild constipation or severe constipation.   mycophenolate (MYFORTIC) 180 MG EC tablet Take 180 mg by mouth 2 (two) times daily.   ondansetron (ZOFRAN) 4 MG tablet Take 1 tablet (4 mg total) by mouth every 8 (eight) hours as needed for up to 9 doses for nausea.   oxyCODONE (OXY IR/ROXICODONE) 5 MG immediate release tablet Take 1 tablet (5 mg total) by mouth every 6 (six) hours as needed for severe pain.   pantoprazole (PROTONIX) 20 MG tablet Take 1 tablet (20 mg total) by mouth daily.   polyethylene glycol (MIRALAX / GLYCOLAX) 17 g packet Take 17 g by mouth daily. Hold if diarrhea   potassium chloride (MICRO-K) 10 MEQ CR capsule Take 10 mEq by mouth 3 (three) times a week.   predniSONE (DELTASONE) 5 MG tablet Take 5 mg by mouth daily.   tacrolimus (PROGRAF) 1 MG capsule Take 1 mg by mouth 2 (two) times daily.   No facility-administered medications prior to visit.   Reviewed past medical and social history.   ROS per HPI above  Last CBC Lab Results  Component Value Date   WBC 7.9 01/23/2022  HGB 7.8 (L) 02/07/2022   HCT 25.0 (L) 01/23/2022   MCV 94.7 01/23/2022   MCH 28.8 01/23/2022   RDW 18.6 (H) 01/23/2022   PLT 208 09/38/1829   Last metabolic panel Lab Results  Component Value Date   GLUCOSE 96 01/23/2022   NA 138 01/23/2022   K 3.8 01/23/2022   CL 104 01/23/2022   CO2 25 01/23/2022   BUN 44 (H) 01/23/2022   CREATININE 2.94 (H) 01/23/2022   GFRNONAA 16 (L) 01/23/2022   CALCIUM 9.2 01/23/2022   PHOS 2.2 (L) 07/26/2021   PROT 5.3 (L) 01/19/2022   ALBUMIN 2.7 (L) 01/19/2022   LABGLOB 3.1 04/04/2021   BILITOT 0.9 01/19/2022   ALKPHOS 58 01/19/2022   AST 16 01/19/2022   ALT 8 01/19/2022    ANIONGAP 9 01/23/2022        Objective:  BP (!) 144/80 (BP Location: Right Arm, Patient Position: Sitting, Cuff Size: Small)   Pulse 69   Temp 98.3 F (36.8 C) (Temporal)   Ht '5\' 5"'$  (1.651 m)   Wt 111 lb 3.2 oz (50.4 kg)   SpO2 99%   BMI 18.50 kg/m     Wt Readings from Last 3 Encounters:  02/08/22 111 lb 3.2 oz (50.4 kg)  01/23/22 111 lb 12.8 oz (50.7 kg)  01/11/22 105 lb (47.6 kg)    Physical Exam Vitals reviewed.  Cardiovascular:     Rate and Rhythm: Normal rate.  Pulmonary:     Effort: No respiratory distress.     Breath sounds: Wheezing and rales present.  Chest:     Chest wall: No tenderness.  Abdominal:     General: There is no distension.     Palpations: Abdomen is soft.  Neurological:     Mental Status: She is alert and oriented to person, place, and time.     No results found for any visits on 02/08/22.    Assessment & Plan:    Problem List Items Addressed This Visit       Other   Dyspnea - Primary   Relevant Orders   DG Chest 2 View   Renal Function Panel   Other Visit Diagnoses     Acute on chronic combined systolic and diastolic CHF (congestive heart failure) (HCC)       Relevant Orders   DG Chest 2 View   Renal Function Panel     Advised to Take furosemide '80mg'$  daily x 3days, then resume '80mg'$  3x/week Go to lab for cxr Return to lab on Monday for repeat renal function. Advised to Go to ED if symptoms worsen or do not improve in 3days.  Return in about 1 week (around 02/15/2022) for CHF and dypsnea.     Wilfred Lacy, NP

## 2022-02-08 NOTE — Patient Instructions (Addendum)
Take furosemide '80mg'$  daily x 3days, then resume '80mg'$  3x/week Go to lab for cxr Return to lab on Monday for repeat renal function. Go to ED if symptoms worsen or do not improve in 3days.

## 2022-02-08 NOTE — Progress Notes (Unsigned)
Dyspnea upon exertion.  Hx of CHF and stage 4 renal failure

## 2022-02-09 ENCOUNTER — Telehealth: Payer: Self-pay | Admitting: Nurse Practitioner

## 2022-02-09 DIAGNOSIS — J4541 Moderate persistent asthma with (acute) exacerbation: Secondary | ICD-10-CM

## 2022-02-09 MED ORDER — ALBUTEROL SULFATE (2.5 MG/3ML) 0.083% IN NEBU: 2.5000 mg | INHALATION_SOLUTION | Freq: Four times a day (QID) | RESPIRATORY_TRACT | 2 refills | Status: AC | PRN

## 2022-02-09 NOTE — Telephone Encounter (Signed)
Caller Name: Elley Harp Call back phone #: [phone]  Reason for Call: Has questions about prescription made 7/27, also it doesn't seem to him that it was sent into the pharmacy.

## 2022-02-09 NOTE — Telephone Encounter (Signed)
Pt advised, they stated the pharmacy informed them that the prescription for the nebulizer solution has expired. Reordered a new RX.

## 2022-02-09 NOTE — Telephone Encounter (Signed)
Pt's husband has called for the 4th time this morning, he states this time that Ms. Daylani has been having trouble breathing and he would like another prescription called into the pharmacy for the nebulizer treatments. He also states that the water pull has not been called into the pharmacy and they are needing this to help her use the bathroom. I've explained to her husband that I will call Him back once we are all caught up with our patients for the day, he may not get a response back within an hour but I will call him back to give him an update from all the previous messages that have been left. Pt voiced understanding.

## 2022-02-09 NOTE — Telephone Encounter (Signed)
Pt and husband called back, they are saying the prescription should have been sent in.

## 2022-02-10 ENCOUNTER — Other Ambulatory Visit: Payer: Self-pay | Admitting: Nurse Practitioner

## 2022-02-10 DIAGNOSIS — K219 Gastro-esophageal reflux disease without esophagitis: Secondary | ICD-10-CM

## 2022-02-12 ENCOUNTER — Other Ambulatory Visit: Payer: Medicare Other

## 2022-02-12 DIAGNOSIS — D631 Anemia in chronic kidney disease: Secondary | ICD-10-CM | POA: Diagnosis not present

## 2022-02-12 DIAGNOSIS — I132 Hypertensive heart and chronic kidney disease with heart failure and with stage 5 chronic kidney disease, or end stage renal disease: Secondary | ICD-10-CM | POA: Diagnosis not present

## 2022-02-12 DIAGNOSIS — M1030 Gout due to renal impairment, unspecified site: Secondary | ICD-10-CM | POA: Diagnosis not present

## 2022-02-12 DIAGNOSIS — N185 Chronic kidney disease, stage 5: Secondary | ICD-10-CM | POA: Diagnosis not present

## 2022-02-12 DIAGNOSIS — I5043 Acute on chronic combined systolic (congestive) and diastolic (congestive) heart failure: Secondary | ICD-10-CM

## 2022-02-12 DIAGNOSIS — R0609 Other forms of dyspnea: Secondary | ICD-10-CM

## 2022-02-12 DIAGNOSIS — I5042 Chronic combined systolic (congestive) and diastolic (congestive) heart failure: Secondary | ICD-10-CM | POA: Diagnosis not present

## 2022-02-12 DIAGNOSIS — N2581 Secondary hyperparathyroidism of renal origin: Secondary | ICD-10-CM | POA: Diagnosis not present

## 2022-02-12 NOTE — Telephone Encounter (Signed)
Chart supports Rx Last OV: 01/2022 Next OV: 02/2022

## 2022-02-14 ENCOUNTER — Ambulatory Visit: Payer: Medicare Other | Admitting: Nurse Practitioner

## 2022-02-14 ENCOUNTER — Ambulatory Visit: Payer: Self-pay

## 2022-02-14 NOTE — Chronic Care Management (AMB) (Signed)
Care Management    RN Visit Note  02/14/2022 Name: [name] MRN: [id number] DOB: 08/23/1945  Subjective: [name] is a 76 y.o. year old female who is a primary care patient of [name], [name], NP. The care management team was consulted for assistance with disease management and care coordination needs.    Engaged with patient by telephone for follow up visit in response to provider referral for case management and/or care coordination services.   Consent to Services:   Ms. Wix was given information about Care Management services today including:  Care Management services includes personalized support from designated clinical staff supervised by her physician, including individualized plan of care and coordination with other care providers 24/7 contact phone numbers for assistance for urgent and routine care needs. The patient may stop case management services at any time by phone call to the office staff.  Patient agreed to services and consent obtained.   Assessment: Review of patient past medical history, allergies, medications, health status, including review of consultants reports, laboratory and other test data, was performed as part of comprehensive evaluation and provision of chronic care management services.   SDOH (Social Determinants of Health) assessments and interventions performed:    Care Plan  Allergies  Allergen Reactions   Warfarin Sodium Other (See Comments)    Caused her to have a stroke/left side of brain to hemorrhage    Ace Inhibitors Cough   Amiodarone Other (See Comments)    Pt with Restrictive Lung Disease by 10/2017 PFT with decreased DLCO   Amlodipine Swelling    Swelling in feet    Outpatient Encounter Medications as of 02/14/2022  Medication Sig Note   acetaminophen (TYLENOL) 500 MG tablet Take 500 mg by mouth every 6 (six) hours as needed for mild pain or headache.     albuterol (PROVENTIL) (2.5 MG/3ML) 0.083% nebulizer solution Take  3 mLs (2.5 mg total) by nebulization every 6 (six) hours as needed for wheezing or shortness of breath. Dx:J45.909    budesonide-formoterol (SYMBICORT) 160-4.5 MCG/ACT inhaler Inhale 2 puffs into the lungs 2 (two) times daily. 12/13/2021: Patient states she takes only as needed.    carvedilol (COREG) 3.125 MG tablet Take 1 tablet (3.125 mg total) by mouth 2 (two) times daily with a meal.    docusate sodium (COLACE) 100 MG capsule Take 100 mg by mouth daily.    FLUoxetine (PROZAC) 20 MG capsule Take 1 capsule (20 mg total) by mouth daily.    furosemide (LASIX) 80 MG tablet Take 80 mg by mouth 3 (three) times a week.    HYDROcodone-acetaminophen (NORCO/VICODIN) 5-325 MG tablet Take 1 tablet by mouth every 6 (six) hours as needed.    lactulose (CHRONULAC) 10 GM/15ML solution Take 30 mLs (20 g total) by mouth daily as needed for mild constipation or severe constipation.    mycophenolate (MYFORTIC) 180 MG EC tablet Take 180 mg by mouth 2 (two) times daily.    ondansetron (ZOFRAN) 4 MG tablet Take 1 tablet (4 mg total) by mouth every 8 (eight) hours as needed for up to 9 doses for nausea.    oxyCODONE (OXY IR/ROXICODONE) 5 MG immediate release tablet Take 1 tablet (5 mg total) by mouth every 6 (six) hours as needed for severe pain.    pantoprazole (PROTONIX) 20 MG tablet TAKE 1 TABLET BY MOUTH EVERY DAY    polyethylene glycol (MIRALAX / GLYCOLAX) 17 g packet Take 17 g by mouth daily. Hold if diarrhea  potassium chloride (MICRO-K) 10 MEQ CR capsule Take 10 mEq by mouth 3 (three) times a week.    predniSONE (DELTASONE) 5 MG tablet Take 5 mg by mouth daily.    tacrolimus (PROGRAF) 1 MG capsule Take 1 mg by mouth 2 (two) times daily.    No facility-administered encounter medications on file as of 02/14/2022.    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   Immunocompromised patient ([place]) 08/13/2021   Chronic atrial fibrillation (HCC)    Right leg weakness 07/23/2021   Erosive esophagitis/esophageal ulceration/duodenal ulceration 07/23/2021   Edema of right lower leg 07/23/2021   Acute GI bleeding 07/08/2021   Azotemia 07/08/2021   Pressure injury of skin 07/08/2021   Asthma, chronic, unspecified asthma severity, with acute exacerbation 06/15/2021   Chronic combined systolic and diastolic CHF (congestive heart failure) ([place]) 06/15/2021   Duodenal ulcer 03/29/2021   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   Gastroesophageal reflux disease 06/29/2020   Iron deficiency anemia 06/29/2020   SOB (shortness of breath) 03/11/2020   Traumatic seroma of lower leg, right, subsequent encounter 01/28/2020   Other secondary pulmonary hypertension ([place]) 12/03/2019   Anticoagulation management encounter 11/04/2019   Pacemaker 04/18/2018   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   Personal history of PE (pulmonary embolism) 26/33/3545   Chronic systolic heart failure ([place]) 02/11/2018   Hyperkalemia 02/03/2018   Lower extremity edema 12/10/2017   Presence of Watchman left atrial appendage closure device 10/04/2017   Dyspnea 07/26/2017   Atrial flutter ([place])    Oligouria    Hypertension    Fractures, stress    Eczema    Depression    Constipation    Arthritis    Exacerbation of asthma 04/01/2017   Closed fracture of right distal radius 10/18/2016   Proteinuria 09/04/2016   History of kidney transplant 2017 07/25/2015   Immunosuppression ([place]) 07/25/2015   Nonischemic dilated cardiomyopathy (HCC)    S/P insertion of IVC (inferior vena caval) filter 04/07/2013   Use of cane as ambulatory aid 04/07/2013   S/P hip replacement 03/25/2012   Personal  history of DVT/PE s/p IVC filter 12/08/2011   Obstructive sleep apnea 12/08/2011   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[place]) 10/24/2011   CKD (chronic kidney disease), stage V ([place]) 09/14/2011   Hypercalcemia    Gout 09/10/2011   Acute blood loss anemia secondary to diverticular bleed  09/04/2011   Absence of right hip joint with antibiotic pacer 09/03/2011   Traumatic seroma of thigh ([place]) 06/11/2011   Clostridium difficile colitis 06/09/2011   Leukocytosis 06/08/2011   Symptomatic anemia 06/06/2011   SINUSITIS- ACUTE-NOS 09/27/2010   Atrial fibrillation s/p AVN ablation/PPM implant, s/p LAAC with Watchman 11/25/2008   Cerebral artery occlusion with cerebral infarction ([place]) 09/28/2008   History of CVA with residual deficit 09/28/2008   Hemiplegia ([place]) 09/21/2008   Psoriasis 10/20/2007   Asthmatic bronchitis without complication 62/56/3893    Conditions to be addressed/monitored: Atrial Fibrillation, CHF, and stroke  Care Plan : [facility]  plan of care  Updates made by Dannielle Karvonen, RN since 02/14/2022 12:00 AM     Problem: Chronic disease management education and care coordination needs.  Priority: High     Long-Range Goal: Develpment of plan of care to address chronic disease management and care coordination needs   Start Date: 12/13/2021  Expected End Date: 12/13/2021  Priority: High  Note:   Goals resolved due to duplicate Current Barriers:  Knowledge Deficits related to plan of care for management of Atrial Fibrillation and CHF  Chronic Disease Management support and education needs related to Atrial Fibrillation and CHF Patient reports recent visit to her primary care provider for shortness of breath, leg swelling. She states she was advised to take her lasix x 3 days then resume previous schedule.  Patient states she is feeling much better. She reports decrease shortness of breath and leg swelling has resolved.  Patient reports her most recent weight was 113. She states she is not  weighing everyday. Patient reports 2 falls since last outreach with RNCM. Most recent fall in July 2023 resulting in ED visit.  Patient reports having to have blood transfusion due to low Hgb.   Patient states she continues to receive her iron infusions. She states she is receiving home physical therapy.   Per chart review patient has follow up appointment with primary care provider on 02/16/22, follow up with cardiologist, Dr. Danelle Earthly on 03/06/22 and follow up with neurology on 03/13/22.  Patient is scheduled for palliative care home visit on 05/01/22.     RNCM Clinical Goal(s):  Patient will verbalize understanding of plan for management of Atrial Fibrillation and CHF as evidenced by patient self report and/ or notation in chart.  take all medications exactly as prescribed and will call provider for medication related questions as evidenced by patient self report and/ or notation in chart.     attend all scheduled medical appointments:   as evidenced by patient self report and/ or notation in chart.         continue to work with Consulting civil engineer and/or Social Worker to address care management and care coordination needs related to Atrial Fibrillation and CHF as evidenced by adherence to CM Team Scheduled appointments     through collaboration with RN Care manager, provider, and care team.   Interventions: 1:1 collaboration with primary care provider regarding development and update of comprehensive plan of care as evidenced by provider attestation and co-signature Inter-disciplinary care team collaboration (see longitudinal plan of care) Evaluation of current treatment plan related to  self management and patient's adherence to plan as established by provider   AFIB Interventions: (Status:  New goal.) Long Term Goal   Counseled on increased risk of stroke due to Afib and benefits of anticoagulation for stroke prevention Reviewed importance of adherence to anticoagulant exactly as prescribed Reviewed  medications with patient and discussed importance of medication adherence Discussed plans with patient for ongoing care management follow up and provided patient with direct contact information for care management team Reviewed scheduled/upcoming provider appointments   Heart Failure Interventions:  (Status:  Resolved due to duplicate goal )  Reviewed Heart Failure Action Plan Discussed importance of daily weight and advised patient to weigh and record daily.  Discussed notifying provider of 3 lbs weight gain overnight or 5 lbs in a week.  Reviewed role of diuretics in prevention of fluid overload and management of heart failure; Advised patient to take her fluid pill as recommended by her provider Discussed the importance of keeping all appointments with provider  Reviewed medications with patient and discussed importance of medication adherence Discussed plans with patient for ongoing care management  follow up and provided patient with direct contact information for care management team Reviewed scheduled/upcoming provider appointments    Stroke:  ( Status: New goal.)  Long term Goal Reviewed medications with patient and discussed importance of medication adherence Discussed plans with patient for ongoing care management follow up and provided patient with direct contact information for care management team Reviewed scheduled/upcoming provider appointments  Discussed fall prevention Discussed signs/ symptoms of stroke.  Advised to call 911 for symptoms Discussed with patient importance of wearing CPAP equipment. Advised to wear nightly.       Patient Goals/Self-Care Activities: Continue to take medications as prescribed ( take your fluid pill as recommended by your doctor)  Attend all scheduled provider appointments Call pharmacy for medication refills 3-7 days in advance of running out of medications Call provider office for new concerns or questions  call office if you gain more  than 3 pounds in one day or 5 pounds in one week - check pulse (heart) rate once a day Weigh daily and record weights Review fall prevention, heart failure and atrial fibrillation education article sent to you in [place]. Follow fall precautions:  Make sure walkways are clear and free of clutter, cords and throw rugs.  Use assistive devices such as cane/ walker as recommended by your provider. Make sure there is good lighting throughout your home.  Use night lights.        Plan: The care management team will reach out to the patient again over the next 45 days.  Quinn Plowman RN,BSN,CCM RN Care Manager Coordinator [place] [phone]

## 2022-02-14 NOTE — Patient Instructions (Signed)
Visit Information  Thank you for taking time to visit with me today. Please don't hesitate to contact me if I can be of assistance to you before our next scheduled telephone appointment.  Following are the goals we discussed today:  Continue to take medications as prescribed ( take your fluid pill as recommended by your doctor)  Attend all scheduled provider appointments Call pharmacy for medication refills 3-7 days in advance of running out of medications Call provider office for new concerns or questions  call office if you gain more than 3 pounds in one day or 5 pounds in one week - check pulse (heart) rate once a day Weigh daily and record weights Review fall prevention, heart failure and atrial fibrillation education article sent to you in [place]. Follow fall precautions:  Make sure walkways are clear and free of clutter, cords and throw rugs.  Use assistive devices such as cane/ walker as recommended by your provider. Make sure there is good lighting throughout your home.  Use night lights.   Our next appointment is by telephone on 03/14/22 at 1:30 pm  Please call the care guide team at [phone] if you need to cancel or reschedule your appointment.   If you are experiencing a Mental Health or [place] or need someone to talk to, please call the Suicide and Crisis Lifeline: 988 call 1-800-273-TALK (toll free, 24 hour hotline)   Patient verbalizes understanding of instructions and care plan provided today and agrees to view in [place]. Active MyChart status and patient understanding of how to access instructions and care plan via MyChart confirmed with patient.      Quinn Plowman RN,BSN,CCM RN Care Manager Coordinator [place]  [phone]

## 2022-02-15 DIAGNOSIS — M1030 Gout due to renal impairment, unspecified site: Secondary | ICD-10-CM | POA: Diagnosis not present

## 2022-02-15 DIAGNOSIS — N2581 Secondary hyperparathyroidism of renal origin: Secondary | ICD-10-CM | POA: Diagnosis not present

## 2022-02-15 DIAGNOSIS — I5042 Chronic combined systolic (congestive) and diastolic (congestive) heart failure: Secondary | ICD-10-CM | POA: Diagnosis not present

## 2022-02-15 DIAGNOSIS — I132 Hypertensive heart and chronic kidney disease with heart failure and with stage 5 chronic kidney disease, or end stage renal disease: Secondary | ICD-10-CM | POA: Diagnosis not present

## 2022-02-15 DIAGNOSIS — N185 Chronic kidney disease, stage 5: Secondary | ICD-10-CM | POA: Diagnosis not present

## 2022-02-15 DIAGNOSIS — D631 Anemia in chronic kidney disease: Secondary | ICD-10-CM | POA: Diagnosis not present

## 2022-02-16 ENCOUNTER — Encounter: Payer: Self-pay | Admitting: Nurse Practitioner

## 2022-02-16 ENCOUNTER — Ambulatory Visit (INDEPENDENT_AMBULATORY_CARE_PROVIDER_SITE_OTHER): Payer: Medicare Other | Admitting: Nurse Practitioner

## 2022-02-16 VITALS — BP 117/74 | HR 63 | Temp 97.6°F | Ht 65.0 in | Wt 111.0 lb

## 2022-02-16 DIAGNOSIS — I5043 Acute on chronic combined systolic (congestive) and diastolic (congestive) heart failure: Secondary | ICD-10-CM

## 2022-02-16 NOTE — Progress Notes (Signed)
Established Patient Visit  Patient: [name]   DOB: 09-24-1945   76 y.o. Female  MRN: [id number] Visit Date: 02/26/2022  Subjective:    Chief Complaint  Patient presents with   Office Visit    F/u CHF, Dyspnea No concerns   Accompanied by home nurse aid HPI Acute on chronic combined systolic and diastolic CHF (congestive heart failure) ([place]) Persistent rales in bilateral lungs, but she reports minimal dyspnea. She did not take furosemide as previously instructed. CXR completed 02/08/2022:Cardiomegaly with increased interstitial prominence suggesting pulmonary edema and congestive heart failure.  BP Readings from Last 3 Encounters:  02/21/22 139/79  02/16/22 117/74  02/08/22 (!) 144/80   Wt Readings from Last 3 Encounters:  02/16/22 111 lb (50.3 kg)  02/08/22 111 lb 3.2 oz (50.4 kg)  01/23/22 111 lb 12.8 oz (50.7 kg)   Advised to Take furosemide '80mg'$  daily till Tuesday Go to lab ([address]) on Tuesday for blood draw. Use bedside commode if necessary to prevent falls or incontinence. You have upcoming appt with cardiology 03/06/22 at 12noon.   Reviewed medical, surgical, and social history today  Medications: Outpatient Medications Prior to Visit  Medication Sig   acetaminophen (TYLENOL) 500 MG tablet Take 500 mg by mouth every 6 (six) hours as needed for mild pain or headache.    albuterol (PROVENTIL) (2.5 MG/3ML) 0.083% nebulizer solution Take 3 mLs (2.5 mg total) by nebulization every 6 (six) hours as needed for wheezing or shortness of breath. Dx:J45.909   budesonide-formoterol (SYMBICORT) 160-4.5 MCG/ACT inhaler Inhale 2 puffs into the lungs 2 (two) times daily.   carvedilol (COREG) 3.125 MG tablet Take 1 tablet (3.125 mg total) by mouth 2 (two) times daily with a meal.   docusate sodium (COLACE) 100 MG capsule Take 100 mg by mouth daily.   furosemide (LASIX) 80 MG tablet Take 80 mg by mouth 3 (three) times a week.   HYDROcodone-acetaminophen  (NORCO/VICODIN) 5-325 MG tablet Take 1 tablet by mouth every 6 (six) hours as needed.   lactulose (CHRONULAC) 10 GM/15ML solution Take 30 mLs (20 g total) by mouth daily as needed for mild constipation or severe constipation.   mycophenolate (MYFORTIC) 180 MG EC tablet Take 180 mg by mouth 2 (two) times daily.   ondansetron (ZOFRAN) 4 MG tablet Take 1 tablet (4 mg total) by mouth every 8 (eight) hours as needed for up to 9 doses for nausea.   oxyCODONE (OXY IR/ROXICODONE) 5 MG immediate release tablet Take 1 tablet (5 mg total) by mouth every 6 (six) hours as needed for severe pain.   pantoprazole (PROTONIX) 20 MG tablet TAKE 1 TABLET BY MOUTH EVERY DAY   polyethylene glycol (MIRALAX / GLYCOLAX) 17 g packet Take 17 g by mouth daily. Hold if diarrhea   potassium chloride (MICRO-K) 10 MEQ CR capsule Take 10 mEq by mouth 3 (three) times a week.   predniSONE (DELTASONE) 5 MG tablet Take 5 mg by mouth daily.   tacrolimus (PROGRAF) 1 MG capsule Take 1 mg by mouth 2 (two) times daily.   [DISCONTINUED] FLUoxetine (PROZAC) 20 MG capsule Take 1 capsule (20 mg total) by mouth daily.   No facility-administered medications prior to visit.   Reviewed past medical and social history.   ROS per HPI above      Objective:  BP 117/74 (BP Location: Right Arm, Patient Position: Sitting, Cuff Size: Small)   Pulse  63   Temp 97.6 F (36.4 C) (Temporal)   Ht '5\' 5"'$  (1.651 m)   Wt 111 lb (50.3 kg)   SpO2 99%   BMI 18.47 kg/m      Physical Exam Cardiovascular:     Rate and Rhythm: Normal rate.     Pulses: Normal pulses.  Pulmonary:     Effort: No respiratory distress.     Breath sounds: Rales present. No wheezing.  Abdominal:     General: There is no distension.     Palpations: Abdomen is soft.  Musculoskeletal:     Right lower leg: Edema present.     Left lower leg: Edema present.  Neurological:     Mental Status: She is alert and oriented to person, place, and time.     No results found for  any visits on 02/16/22.    Assessment & Plan:    Problem List Items Addressed This Visit       Cardiovascular and Mediastinum   Acute on chronic combined systolic and diastolic CHF (congestive heart failure) (HCC) - Primary    Persistent rales in bilateral lungs, but she reports minimal dyspnea. She did not take furosemide as previously instructed. CXR completed 02/08/2022:Cardiomegaly with increased interstitial prominence suggesting pulmonary edema and congestive heart failure.  BP Readings from Last 3 Encounters:  02/21/22 139/79  02/16/22 117/74  02/08/22 (!) 144/80   Wt Readings from Last 3 Encounters:  02/16/22 111 lb (50.3 kg)  02/08/22 111 lb 3.2 oz (50.4 kg)  01/23/22 111 lb 12.8 oz (50.7 kg)   Advised to Take furosemide '80mg'$  daily till Tuesday Go to lab ([address]) on Tuesday for blood draw. Use bedside commode if necessary to prevent falls or incontinence. You have upcoming appt with cardiology 03/06/22 at 12noon.       Return if symptoms worsen or fail to improve.     Wilfred Lacy, NP

## 2022-02-16 NOTE — Patient Instructions (Addendum)
Take furosemide '80mg'$  daily till Tuesday Go to lab ([address]) on Tuesday for blood draw. Use bedside commode if necessary to prevent falls or incontinence. You have upcoming appt with cardiology 03/06/22 at 12noon.  Use guaze dressing in skin fold. Change at least 2x/day. Call office if it does not heal in 1week.

## 2022-02-21 ENCOUNTER — Encounter (HOSPITAL_COMMUNITY)
Admission: RE | Admit: 2022-02-21 | Discharge: 2022-02-21 | Disposition: A | Discharge status: Home / Self Care | Payer: Medicare Other | Source: Ambulatory Visit | Attending: Nephrology | Admitting: Nephrology

## 2022-02-21 ENCOUNTER — Encounter (HOSPITAL_COMMUNITY): Payer: Medicare Other

## 2022-02-21 ENCOUNTER — Ambulatory Visit (HOSPITAL_BASED_OUTPATIENT_CLINIC_OR_DEPARTMENT_OTHER): Payer: Medicare Other | Admitting: Psychiatry

## 2022-02-21 VITALS — BP 139/79 | HR 70 | Temp 97.6°F | Resp 16

## 2022-02-21 DIAGNOSIS — F341 Dysthymic disorder: Secondary | ICD-10-CM | POA: Diagnosis not present

## 2022-02-21 DIAGNOSIS — D631 Anemia in chronic kidney disease: Secondary | ICD-10-CM | POA: Diagnosis not present

## 2022-02-21 DIAGNOSIS — I1311 Hypertensive heart and chronic kidney disease without heart failure, with stage 5 chronic kidney disease, or end stage renal disease: Secondary | ICD-10-CM | POA: Insufficient documentation

## 2022-02-21 DIAGNOSIS — N185 Chronic kidney disease, stage 5: Secondary | ICD-10-CM | POA: Diagnosis not present

## 2022-02-21 DIAGNOSIS — F331 Major depressive disorder, recurrent, moderate: Secondary | ICD-10-CM | POA: Diagnosis not present

## 2022-02-21 LAB — POCT HEMOGLOBIN-HEMACUE: Hemoglobin: 10.3 g/dL — ABNORMAL LOW (ref 12.0–15.0)

## 2022-02-21 MED ORDER — EPOETIN ALFA-EPBX 40000 UNIT/ML IJ SOLN: 20000.0000 [IU] | INTRAMUSCULAR | Status: DC

## 2022-02-21 MED ORDER — FLUOXETINE HCL 20 MG PO CAPS: ORAL_CAPSULE | ORAL | 2 refills | Status: DC

## 2022-02-21 MED ORDER — EPOETIN ALFA 20000 UNIT/ML IJ SOLN
20000.0000 [IU] | INTRAMUSCULAR | Status: DC
  Administered 2022-02-21: 20000 [IU] via SUBCUTANEOUS

## 2022-02-21 MED ORDER — EPOETIN ALFA 20000 UNIT/ML IJ SOLN
INTRAMUSCULAR | Status: AC
  Filled 2022-02-21: qty 1

## 2022-02-21 NOTE — Progress Notes (Signed)
Psychiatric Initial Adult Assessment   Patient Identification: [name] MRN:  [id number] Date of Evaluation:  02/21/2022 Referral Source: Self-referred Chief Complaint:  No chief complaint on file.  Visit Diagnosis: Major depression   ICD-10-CM   1. Moderate episode of recurrent major depressive disorder (HCC)  F33.1 FLUoxetine (PROZAC) 20 MG capsule      History of Present Illness:    This patient is a 76 year old African-American female who has no significant past psychiatric history.  Over the last year or so she has been treated with Prozac 20 mg and she claims it had little effect.  The patient been married for over 20 years.  She has somewhat of a conflictual relationship with her husband.  They have difficulty communicating and negotiating but otherwise they seem to be fairly stable.  Patient has 2 daughters.  In January the patient suffered a CVA.  Noted her other trauma is the fact that 2 years ago her 32-year-old grandchild died in a car accident.  In this evaluation the patient's mood seems quite good.  She was laughing and smiling through much of it.  She was seen with her husband and [name].  The patient denies persistent daily depression.  She says she feels depressed every morning but it abates easily.  Her sleep is disturbed and that is excessive.  I would suggest that she has psychomotor slowing.  Her appetite is reduced and she has lost weight.  Her energy level was acceptable.  She has no problems thinking and concentrating.  She still enjoys things.  She reads watches TV.  She used to use jewelry as a hobby but no longer.  The patient is bothered a lot that she can no longer drive because of her physical condition.  She denies worthlessness she is not suicidal now and never has been.  She denies use of alcohol or drugs.  She never had psychotic symptoms.  In close evaluation she does not really describe clinical major depression.  She is morbidly described as uncomplicated  bereavement and now is extensive.  Patient is in group therapy and will continue it.  She has never had a manic episode.  She denies symptoms of generalized anxiety disorder, panic disorder or OCD.  Financially the patient is doing well.  She has had no recent deaths of friends or family.  The patient says she has a number of friends.  Her husband says that she stays in bed much of the time and sleeps excessively.  Associated Signs/Symptoms: Depression Symptoms:  hypersomnia, (Hypo) Manic Symptoms:   Anxiety Symptoms:   Psychotic Symptoms:   PTSD Symptoms:   Past Psychiatric History: Prozac  Previous Psychotropic Medications: Yes   Substance Abuse History in the last 12 months:  No.  Consequences of Substance Abuse: NA  Past Medical History:  Past Medical History:  Diagnosis Date   Acute diastolic (congestive) heart failure ([place]) 08/12/2017   Acute GI bleeding 11/14/2019   Acute kidney injury superimposed on chronic kidney disease ([place]) 11/17/2017   AKI (acute kidney injury) ([place]) 11/04/2017   Anemia    Anxiety    ARF (acute renal failure) ([place]) 06/06/2011   Arthritis    "shoulders; arms; hips" (02/04/2018)   Asthma    Atrial fibrillation (HCC)    Atrial flutter (HCC)    s/p aflutter ablation at [facility]   Bacteriuria, asymptomatic 11/14/2020   Benign hypertension with ESRD (end-stage renal disease) ([place])    Blood transfusion    never had a  reaction to blood transfusion   Cardiomyopathy Mar 2013   Mild, EF 50-55% by Mar 2013 ECHO, diast dysfxn II   Cardiomyopathy ([place])    CHF (congestive heart failure) ([place]) 2005   CHF (congestive heart failure) ([place])    Childhood asthma    Closed fracture of right distal radius 10/18/2016   CMV (cytomegalovirus infection) ([place]) 10/03/2015   Constipation    takes Miralax daily   Constipation    Depression    takes Zoloft daily   Distal radius fracture, right    DVT of lower extremity, bilateral ([place]) 12/21/11   "they're there now; been there  for 2 wks"   Eczema    End stage renal disease ([place]) 11/06/2011   ESRD (end stage renal disease) on dialysis [facility]) 09/2011   07-19-2015 had Kidney transplant at [facility]; "don't get dialysis anymore" (02/04/2018)   Essential hypertension 07/07/2007   Qualifier: Diagnosis of  By: Garen Grams     Fractures, stress    in both feet--6 OR 7 YRS AGO--RESOLVED   Generalized edema [id number]   GI bleed 11/14/2019   Gout    doesn't require meds    HCAP (healthcare-associated pneumonia) 09/10/2011   Hearing difficulty    Hematoma of left lower leg 05/17/2020   High cholesterol    History of CVA (cerebrovascular accident) without residual deficits    History of hip replacement, total, right    History of pneumonia    Hx of Clostridium difficile infection    Hypercalcemia    09/10/11   Hyperparathyroidism ([place]) 04/07/2013   Hypertension    takes Diltiazem daily    Hypomagnesemia 08/02/2015   Hypophosphatemia 11/08/2017   ICB (intracranial bleed) ([place])    Immunosuppression ([place]) 07/25/2015   Memory changes    Morbid obesity ([place])    Nonischemic dilated cardiomyopathy ([place])    Obesity 04/07/2013   Oligouria    Oral mucositis 02/22/2018   OSA on CPAP    PAF (paroxysmal atrial fibrillation) (HCC)     HX OF CEREBRAL BLEED WHILE ON COUMADIN-SO PT NOT ON ANY BLOOD THINNERS NOW   Peripheral vascular disease ([place])    Persistent atrial fibrillation ([place]) 08/12/2017   Overview:  Added automatically from request for surgery [id number]  Formatting of this note might be different from the original. Added automatically from request for surgery [id number]   Presence of Watchman left atrial appendage closure device 10/04/2017   Proteinuria 09/04/2016   Psoriasis 08/07/2016   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/25/2015   S/P insertion of IVC (inferior vena caval) filter    Sepsis ([place]) 11/08/2017   Stress fracture    bilateral feet   Stroke ([place]) 2009   denies residual on 02/04/2018;  hemorrhagic now off coumadin    Stroke ([place]) 07/17/2007   denies residual, hemorrhagic now off coumadin   Stroke due to intracerebral hemorrhage ([place]) 2009   Suture reaction 09/04/2016   Transfusion history    Use of cane as ambulatory aid     Past Surgical History:  Procedure Laterality Date   AV FISTULA PLACEMENT  09/28/2011   Procedure: ARTERIOVENOUS (AV) FISTULA CREATION;  Surgeon: Rosetta Posner, MD;  Location: [place];  Service: Vascular;  Laterality: Left;   BIOPSY  07/10/2021   Procedure: BIOPSY;  Surgeon: Irving Copas., MD;  Location: [facility];  Service: Gastroenterology;;   [place]  1980's   COLONOSCOPY     COLONOSCOPY WITH PROPOFOL N/A 11/16/2019   Procedure: COLONOSCOPY WITH PROPOFOL;  Surgeon: Juanita Craver, MD;  Location: [facility] ENDOSCOPY;  Service: Endoscopy;  Laterality: N/A;   CYSTOSCOPY     many yrs ago   ESOPHAGOGASTRODUODENOSCOPY N/A 07/10/2021   Procedure: ESOPHAGOGASTRODUODENOSCOPY (EGD);  Surgeon: Irving Copas., MD;  Location: [facility];  Service: Gastroenterology;  Laterality: N/A;   ESOPHAGOGASTRODUODENOSCOPY (EGD) WITH PROPOFOL N/A 11/16/2019   Procedure: ESOPHAGOGASTRODUODENOSCOPY (EGD) WITH PROPOFOL;  Surgeon: Juanita Craver, MD;  Location: [facility] ENDOSCOPY;  Service: Endoscopy;  Laterality: N/A;   ESOPHAGOGASTRODUODENOSCOPY (EGD) WITH PROPOFOL N/A 03/20/2021   Procedure: ESOPHAGOGASTRODUODENOSCOPY (EGD) WITH PROPOFOL;  Surgeon: Milus Banister, MD;  Location: [facility] ENDOSCOPY;  Service: Endoscopy;  Laterality: N/A;   FEMUR FRACTURE SURGERY Right 03/2011; 09/03/2011   "had 2, 2 wk apart in 2012; broke it again 08/2011 & had OR"   [place] N/A 07/10/2021   Procedure: FLEXIBLE SIGMOIDOSCOPY;  Surgeon: Rush Landmark Telford Nab., MD;  Location: [facility];  Service: Gastroenterology;  Laterality: N/A;   FRACTURE SURGERY     GIVENS CAPSULE STUDY N/A 07/11/2021   Procedure: GIVENS  CAPSULE STUDY;  Surgeon: Juanita Craver, MD;  Location: [facility] ENDOSCOPY;  Service: Endoscopy;  Laterality: N/A;   HEMOSTASIS CONTROL  03/20/2021   Procedure: HEMOSTASIS CONTROL;  Surgeon: Milus Banister, MD;  Location: [place];  Service: Endoscopy;;   HOT HEMOSTASIS N/A 03/20/2021   Procedure: HOT HEMOSTASIS (ARGON PLASMA COAGULATION/BICAP);  Surgeon: Milus Banister, MD;  Location: [facility] ENDOSCOPY;  Service: Endoscopy;  Laterality: N/A;   HOT HEMOSTASIS N/A 07/10/2021   Procedure: HOT HEMOSTASIS (ARGON PLASMA COAGULATION/BICAP);  Surgeon: Irving Copas., MD;  Location: [facility];  Service: Gastroenterology;  Laterality: N/A;   I & D EXTREMITY  09/15/2011   Procedure: IRRIGATION AND DEBRIDEMENT EXTREMITY w REMOVAL OF HARDWARE;  Surgeon: [name], MD;  Location: [place];  Service: Orthopedics;  Laterality: Right;   INCISION AND DRAINAGE HIP  12/21/2011   Procedure: IRRIGATION AND DEBRIDEMENT HIP;  Surgeon: [name], MD;  Location: [place];  Service: Orthopedics;  Laterality: Right;  I&D RIGHT HIP WITH PLACEMENT ANTIBIOTIC SPACER   INSERTION OF DIALYSIS CATHETER     Procedure: INSERTION OF DIALYSIS CATHETER;  Surgeon: Rosetta Posner, MD;  Location: [place];  Service: Vascular;  Laterality: Right;   IRRIGATION AND DEBRIDEMENT ABSCESS  12/21/11   right hip   JOINT REPLACEMENT     KIDNEY TRANSPLANT  07/19/15   LAPAROSCOPIC GASTRIC BANDING  2008   LEFT ATRIAL APPENDAGE OCCLUSION  10/04/2017   OPEN REDUCTION INTERNAL FIXATION (ORIF) DISTAL RADIAL FRACTURE Right 10/18/2016   Procedure: OPEN REDUCTION INTERNAL FIXATION (ORIF) RIGHT DISTAL RADIAL FRACTURE;  Surgeon: Marchia Bond, MD;  Location: [place];  Service: Orthopedics;  Laterality: Right;   SUBMUCOSAL TATTOO INJECTION  07/10/2021   Procedure: SUBMUCOSAL TATTOO INJECTION;  Surgeon: Irving Copas., MD;  Location: [facility];  Service: Gastroenterology;;   TOTAL HIP ARTHROPLASTY Right 02/2011   right THA 02/2011,  I&D/removal of hardware 09/2011,, repeat I&D Jun 2013, reimplantation R THA 03-26-2012   TOTAL HIP REVISION  03/25/2012   Procedure: TOTAL HIP REVISION;  Surgeon: [name], MD;  Location: [facility];  Service: Orthopedics;  Laterality: Right;  Right Total Hip Reimplantation   TUBAL LIGATION     VENA CAVA FILTER PLACEMENT  11/2011    Family Psychiatric History:   Family History:  Family History  Problem  Relation Age of Onset   Cancer Father    Diabetes Mother    Hypertension Mother    Arthritis Mother    Hodgkin's lymphoma Other 34       dscd---HODGKINS DISEASE   Hypertension Brother     Social History:   Social History   Socioeconomic History   Marital status: Married    Spouse name: Not on file   Number of children: Not on file   Years of education: Not on file   Highest education level: Not on file  Occupational History   Occupation: retired  Tobacco Use   Smoking status: Never   Smokeless tobacco: Never  Vaping Use   Vaping Use: Never used  Substance and Sexual Activity   Alcohol use: Never   Drug use: Never   Sexual activity: Yes    Birth control/protection: Post-menopausal  Other Topics Concern   Not on file  Social History Narrative   Married; retired; lives in [place] Strain: [place]  (12/05/2021)   Overall Financial Resource Strain (CARDIA)    Difficulty of Paying Living Expenses: Not hard at all  Food Insecurity: No [place] (12/15/2021)   Hunger Vital Sign    Worried About Running Out of Food in the Last Year: Never true    [place] in the Last Year: Never true  Transportation Needs: No Transportation Needs (12/13/2021)   PRAPARE - Hydrologist (Medical): No    Lack of Transportation (Non-Medical): No  Physical Activity: Inactive (12/05/2021)   Exercise Vital Sign    Days of Exercise per Week: 0 days    Minutes of Exercise per Session: 0 min  Stress: No  Stress Concern Present (12/05/2021)   [place]    Feeling of Stress : Not at all  Social Connections: [place] (12/05/2021)   Social Connection and Isolation Panel [NHANES]    Frequency of Communication with Friends and Family: Twice a week    Frequency of Social Gatherings with Friends and Family: Twice a week    Attends Religious Services: More than 4 times per year    Active Member of [employer] or Organizations: Yes    Attends Archivist Meetings: 1 to 4 times per year    Marital Status: Married    Additional Social History:   Allergies:   Allergies  Allergen Reactions   Warfarin Sodium Other (See Comments)    Caused her to have a stroke/left side of brain to hemorrhage    Ace Inhibitors Cough   Amiodarone Other (See Comments)    Pt with Restrictive Lung Disease by 10/2017 PFT with decreased DLCO   Amlodipine Swelling    Swelling in feet    Metabolic Disorder Labs: Lab Results  Component Value Date   HGBA1C 4.7 (L) 07/23/2021   MPG 88.19 07/23/2021   MPG 111 11/16/2008   No results found for: "PROLACTIN" Lab Results  Component Value Date   CHOL 124 07/24/2021   TRIG 95 07/24/2021   HDL 34 (L) 07/24/2021   CHOLHDL 3.6 07/24/2021   VLDL 19 07/24/2021   LDLCALC 71 07/24/2021   LDLCALC 69 11/03/2020   Lab Results  Component Value Date   TSH 1.765 07/23/2021    Therapeutic Level Labs: No results found for: "LITHIUM" No results found for: "CBMZ" No results found for: "VALPROATE"  Current Medications: Current Outpatient  Medications  Medication Sig Dispense Refill   acetaminophen (TYLENOL) 500 MG tablet Take 500 mg by mouth every 6 (six) hours as needed for mild pain or headache.      albuterol (PROVENTIL) (2.5 MG/3ML) 0.083% nebulizer solution Take 3 mLs (2.5 mg total) by nebulization every 6 (six) hours as needed for wheezing or shortness of breath. Dx:J45.909 150 mL 2    budesonide-formoterol (SYMBICORT) 160-4.5 MCG/ACT inhaler Inhale 2 puffs into the lungs 2 (two) times daily. 1 each 5   carvedilol (COREG) 3.125 MG tablet Take 1 tablet (3.125 mg total) by mouth 2 (two) times daily with a meal. 180 tablet 3   docusate sodium (COLACE) 100 MG capsule Take 100 mg by mouth daily.     FLUoxetine (PROZAC) 20 MG capsule 2 qam 60 capsule 2   furosemide (LASIX) 80 MG tablet Take 80 mg by mouth 3 (three) times a week.     HYDROcodone-acetaminophen (NORCO/VICODIN) 5-325 MG tablet Take 1 tablet by mouth every 6 (six) hours as needed. 12 tablet 0   lactulose (CHRONULAC) 10 GM/15ML solution Take 30 mLs (20 g total) by mouth daily as needed for mild constipation or severe constipation. 236 mL 0   mycophenolate (MYFORTIC) 180 MG EC tablet Take 180 mg by mouth 2 (two) times daily.     ondansetron (ZOFRAN) 4 MG tablet Take 1 tablet (4 mg total) by mouth every 8 (eight) hours as needed for up to 9 doses for nausea. 9 tablet 0   oxyCODONE (OXY IR/ROXICODONE) 5 MG immediate release tablet Take 1 tablet (5 mg total) by mouth every 6 (six) hours as needed for severe pain. 10 tablet 0   pantoprazole (PROTONIX) 20 MG tablet TAKE 1 TABLET BY MOUTH EVERY DAY 90 tablet 0   polyethylene glycol (MIRALAX / GLYCOLAX) 17 g packet Take 17 g by mouth daily. Hold if diarrhea 14 each 0   potassium chloride (MICRO-K) 10 MEQ CR capsule Take 10 mEq by mouth 3 (three) times a week.     predniSONE (DELTASONE) 5 MG tablet Take 5 mg by mouth daily.     tacrolimus (PROGRAF) 1 MG capsule Take 1 mg by mouth 2 (two) times daily.     No current facility-administered medications for this visit.   Facility-Administered Medications Ordered in Other Visits  Medication Dose Route Frequency Provider Last Rate Last Admin   epoetin alfa (EPOGEN) 20000 UNIT/ML injection            epoetin alfa (EPOGEN) injection 20,000 Units  20,000 Units Subcutaneous Q14 Days Claudia Desanctis, MD   20,000 Units at 02/21/22 [id number]     Musculoskeletal: Strength & Muscle Tone: abnormal Gait & Station: On a walker Patient leans: N/A  Psychiatric Specialty Exam: Review of Systems  There were no vitals taken for this visit.There is no height or weight on file to calculate BMI.  General Appearance: Casual  Eye Contact:  Good  Speech:  Clear and Coherent  Volume:  Normal  Mood:  Negative  Affect:  Appropriate  Thought Process:  Coherent  Orientation:  Full (Time, Place, and Person)  Thought Content:  Logical  Suicidal Thoughts:  No  Homicidal Thoughts:  No  Memory:  NA  Judgement:  Good  Insight:  Good  Psychomotor Activity:  Normal  Concentration:    Recall:  Good  Fund of Knowledge:Fair  Language: Good  Akathisia:  No  Handed:  Right  AIMS (if indicated):  not done  Assets:  Desire for  Improvement  ADL's:  Intact  Cognition: WNL  Sleep:  Negative   Screenings: GAD-7    Flowsheet Row Office Visit from 11/14/2021 in [place] Video Visit from 10/10/2021 in [facility] Visit from 07/21/2021 in [facility] Visit from 03/29/2021 in [facility] Visit from 11/03/2020 in [facility]  Total GAD-7 Score '5 7 13 6 5      '$ Mini-Mental    [place] from 09/18/2016 in [place] at [facility]  Total Score (max 30 points ) 29      PHQ2-9    [place] Chronic [facility] Management from 12/13/2021 in [facility] from 12/05/2021 in [place] Visit from 11/14/2021 in [facility] Patient Outreach Telephone from 10/24/2021 in [facility] Video Visit from 10/10/2021 in [facility]  PHQ-2 Total Score 2 0 '6 2 3  '$ PHQ-9 Total Score 11 -- '17 13 12      '$ Flowsheet Row ED to Hosp-Admission (Discharged)  from 01/19/2022 in [facility] ED from 12/31/2021 in [facility] Medication Injection 15 from 12/22/2021 in [facility] No Risk No Risk No Risk       Assessment and Plan:   At this time the patient does not clearly meet an episode of major depression.  I am not clear if she ever did.  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  The only points against that conclusion is the fact that she is sleeping excessively and would be described as having psychomotor slowing.  She does not have anhedonia and she denies persistent daily depression.  Her appetite is reduced.  I think the most important intervention is that she stay in psychotherapy.  Today we will go ahead and increase her Prozac to the full dose of 40 mg.  She will return to see me in 2 months.  If she experiences no benefit we will taper and discontinue Prozac.  The outside chance to consider Remeron to help her appetite should possibly be considered.  I think her depression is meaningful but I do not think he meets the criteria for major depression and therefore would require multiple trials of antidepressants.  The patient is actually functioning pretty well.  Collaboration of Care:   Patient/Guardian was advised Release of Information must be obtained prior to any record release in order to collaborate their care with an outside provider. Patient/Guardian was advised if they have not already done so to contact the registration department to sign all necessary forms in order for [name] to release information regarding their care.   Consent: Patient/Guardian gives verbal consent for treatment and assignment of benefits for services provided during this visit. Patient/Guardian expressed understanding and agreed to proceed.   Jerral Ralph, MD 8/9/20234:53 PM

## 2022-02-22 ENCOUNTER — Telehealth: Payer: Self-pay | Admitting: Nurse Practitioner

## 2022-02-22 DIAGNOSIS — N185 Chronic kidney disease, stage 5: Secondary | ICD-10-CM | POA: Diagnosis not present

## 2022-02-22 DIAGNOSIS — D631 Anemia in chronic kidney disease: Secondary | ICD-10-CM | POA: Diagnosis not present

## 2022-02-22 DIAGNOSIS — M1030 Gout due to renal impairment, unspecified site: Secondary | ICD-10-CM | POA: Diagnosis not present

## 2022-02-22 DIAGNOSIS — N2581 Secondary hyperparathyroidism of renal origin: Secondary | ICD-10-CM | POA: Diagnosis not present

## 2022-02-22 DIAGNOSIS — I132 Hypertensive heart and chronic kidney disease with heart failure and with stage 5 chronic kidney disease, or end stage renal disease: Secondary | ICD-10-CM | POA: Diagnosis not present

## 2022-02-22 DIAGNOSIS — I5042 Chronic combined systolic (congestive) and diastolic (congestive) heart failure: Secondary | ICD-10-CM | POA: Diagnosis not present

## 2022-02-22 NOTE — Telephone Encounter (Signed)
Called & spoke w/ [name]' worker through [employer] [facility]). She needs speech for her short term memory loss.

## 2022-02-22 NOTE — Telephone Encounter (Signed)
Caller Name: [facility] Call back phone #: [phone]  Reason for Call: Asked for approval order from [name] to extend speech therapy once a week for 5 weeks.

## 2022-02-26 ENCOUNTER — Telehealth: Payer: Self-pay | Admitting: Nurse Practitioner

## 2022-02-26 DIAGNOSIS — I4891 Unspecified atrial fibrillation: Secondary | ICD-10-CM | POA: Diagnosis not present

## 2022-02-26 DIAGNOSIS — Z4509 Encounter for adjustment and management of other cardiac device: Secondary | ICD-10-CM | POA: Diagnosis not present

## 2022-02-26 NOTE — Telephone Encounter (Signed)
See note

## 2022-02-26 NOTE — Assessment & Plan Note (Signed)
Persistent rales in bilateral lungs, but she reports minimal dyspnea. She did not take furosemide as previously instructed. CXR completed 02/08/2022:Cardiomegaly with increased interstitial prominence suggesting pulmonary edema and congestive heart failure.  BP Readings from Last 3 Encounters:  02/21/22 139/79  02/16/22 117/74  02/08/22 (!) 144/80   Wt Readings from Last 3 Encounters:  02/16/22 111 lb (50.3 kg)  02/08/22 111 lb 3.2 oz (50.4 kg)  01/23/22 111 lb 12.8 oz (50.7 kg)   Advised to Take furosemide '80mg'$  daily till Tuesday Go to lab ([address]) on Tuesday for blood draw. Use bedside commode if necessary to prevent falls or incontinence. You have upcoming appt with cardiology 03/06/22 at 12noon.

## 2022-02-26 NOTE — Telephone Encounter (Signed)
Left detailed vm adv pt to call back

## 2022-02-27 DIAGNOSIS — I5042 Chronic combined systolic (congestive) and diastolic (congestive) heart failure: Secondary | ICD-10-CM | POA: Diagnosis not present

## 2022-02-27 DIAGNOSIS — D631 Anemia in chronic kidney disease: Secondary | ICD-10-CM | POA: Diagnosis not present

## 2022-02-27 DIAGNOSIS — N2581 Secondary hyperparathyroidism of renal origin: Secondary | ICD-10-CM | POA: Diagnosis not present

## 2022-02-27 DIAGNOSIS — I132 Hypertensive heart and chronic kidney disease with heart failure and with stage 5 chronic kidney disease, or end stage renal disease: Secondary | ICD-10-CM | POA: Diagnosis not present

## 2022-02-27 DIAGNOSIS — M1030 Gout due to renal impairment, unspecified site: Secondary | ICD-10-CM | POA: Diagnosis not present

## 2022-02-27 DIAGNOSIS — N185 Chronic kidney disease, stage 5: Secondary | ICD-10-CM | POA: Diagnosis not present

## 2022-02-28 ENCOUNTER — Telehealth: Payer: Self-pay | Admitting: Nurse Practitioner

## 2022-02-28 DIAGNOSIS — E46 Unspecified protein-calorie malnutrition: Secondary | ICD-10-CM | POA: Diagnosis not present

## 2022-02-28 DIAGNOSIS — K2210 Ulcer of esophagus without bleeding: Secondary | ICD-10-CM | POA: Diagnosis not present

## 2022-02-28 DIAGNOSIS — E7800 Pure hypercholesterolemia, unspecified: Secondary | ICD-10-CM | POA: Diagnosis not present

## 2022-02-28 DIAGNOSIS — I739 Peripheral vascular disease, unspecified: Secondary | ICD-10-CM | POA: Diagnosis not present

## 2022-02-28 DIAGNOSIS — I132 Hypertensive heart and chronic kidney disease with heart failure and with stage 5 chronic kidney disease, or end stage renal disease: Secondary | ICD-10-CM | POA: Diagnosis not present

## 2022-02-28 DIAGNOSIS — I69351 Hemiplegia and hemiparesis following cerebral infarction affecting right dominant side: Secondary | ICD-10-CM | POA: Diagnosis not present

## 2022-02-28 DIAGNOSIS — I5042 Chronic combined systolic (congestive) and diastolic (congestive) heart failure: Secondary | ICD-10-CM | POA: Diagnosis not present

## 2022-02-28 DIAGNOSIS — J45909 Unspecified asthma, uncomplicated: Secondary | ICD-10-CM | POA: Diagnosis not present

## 2022-02-28 DIAGNOSIS — M150 Primary generalized (osteo)arthritis: Secondary | ICD-10-CM | POA: Diagnosis not present

## 2022-02-28 DIAGNOSIS — Z7901 Long term (current) use of anticoagulants: Secondary | ICD-10-CM | POA: Diagnosis not present

## 2022-02-28 DIAGNOSIS — G4733 Obstructive sleep apnea (adult) (pediatric): Secondary | ICD-10-CM | POA: Diagnosis not present

## 2022-02-28 DIAGNOSIS — Z86718 Personal history of other venous thrombosis and embolism: Secondary | ICD-10-CM | POA: Diagnosis not present

## 2022-02-28 DIAGNOSIS — I4819 Other persistent atrial fibrillation: Secondary | ICD-10-CM | POA: Diagnosis not present

## 2022-02-28 DIAGNOSIS — Z9181 History of falling: Secondary | ICD-10-CM | POA: Diagnosis not present

## 2022-02-28 DIAGNOSIS — N185 Chronic kidney disease, stage 5: Secondary | ICD-10-CM | POA: Diagnosis not present

## 2022-02-28 DIAGNOSIS — D631 Anemia in chronic kidney disease: Secondary | ICD-10-CM | POA: Diagnosis not present

## 2022-02-28 DIAGNOSIS — F419 Anxiety disorder, unspecified: Secondary | ICD-10-CM | POA: Diagnosis not present

## 2022-02-28 DIAGNOSIS — H9190 Unspecified hearing loss, unspecified ear: Secondary | ICD-10-CM | POA: Diagnosis not present

## 2022-02-28 DIAGNOSIS — Z7952 Long term (current) use of systemic steroids: Secondary | ICD-10-CM | POA: Diagnosis not present

## 2022-02-28 DIAGNOSIS — F330 Major depressive disorder, recurrent, mild: Secondary | ICD-10-CM | POA: Diagnosis not present

## 2022-02-28 DIAGNOSIS — I428 Other cardiomyopathies: Secondary | ICD-10-CM | POA: Diagnosis not present

## 2022-02-28 DIAGNOSIS — N2581 Secondary hyperparathyroidism of renal origin: Secondary | ICD-10-CM | POA: Diagnosis not present

## 2022-02-28 DIAGNOSIS — D849 Immunodeficiency, unspecified: Secondary | ICD-10-CM | POA: Diagnosis not present

## 2022-02-28 DIAGNOSIS — Z7951 Long term (current) use of inhaled steroids: Secondary | ICD-10-CM | POA: Diagnosis not present

## 2022-02-28 DIAGNOSIS — M1030 Gout due to renal impairment, unspecified site: Secondary | ICD-10-CM | POA: Diagnosis not present

## 2022-02-28 NOTE — Telephone Encounter (Signed)
Called & provided kelly a verbal order on VM

## 2022-02-28 NOTE — Telephone Encounter (Signed)
[name] from [place] Needs verbal order to extend home PT. 1xw for 8w.

## 2022-03-01 ENCOUNTER — Other Ambulatory Visit (INDEPENDENT_AMBULATORY_CARE_PROVIDER_SITE_OTHER): Payer: Medicare Other

## 2022-03-01 DIAGNOSIS — I5043 Acute on chronic combined systolic (congestive) and diastolic (congestive) heart failure: Secondary | ICD-10-CM | POA: Diagnosis not present

## 2022-03-01 DIAGNOSIS — R0609 Other forms of dyspnea: Secondary | ICD-10-CM

## 2022-03-01 LAB — RENAL FUNCTION PANEL
Albumin: 3.6 g/dL (ref 3.5–5.2)
BUN: 40 mg/dL — ABNORMAL HIGH (ref 6–23)
CO2: 26 mEq/L (ref 19–32)
Calcium: 9.1 mg/dL (ref 8.4–10.5)
Chloride: 102 mEq/L (ref 96–112)
Creatinine, Ser: 2.97 mg/dL — ABNORMAL HIGH (ref 0.40–1.20)
GFR: 15.12 mL/min — ABNORMAL LOW (ref >60.00)
Glucose, Bld: 123 mg/dL — ABNORMAL HIGH (ref 70–99)
Phosphorus: 3.3 mg/dL (ref 2.3–4.6)
Potassium: 3.2 mEq/L — ABNORMAL LOW (ref 3.5–5.1)
Sodium: 140 mEq/L (ref 135–145)

## 2022-03-03 DIAGNOSIS — Z45018 Encounter for adjustment and management of other part of cardiac pacemaker: Secondary | ICD-10-CM | POA: Diagnosis not present

## 2022-03-03 DIAGNOSIS — I4891 Unspecified atrial fibrillation: Secondary | ICD-10-CM | POA: Diagnosis not present

## 2022-03-05 DIAGNOSIS — N2581 Secondary hyperparathyroidism of renal origin: Secondary | ICD-10-CM | POA: Diagnosis not present

## 2022-03-05 DIAGNOSIS — I132 Hypertensive heart and chronic kidney disease with heart failure and with stage 5 chronic kidney disease, or end stage renal disease: Secondary | ICD-10-CM | POA: Diagnosis not present

## 2022-03-05 DIAGNOSIS — D631 Anemia in chronic kidney disease: Secondary | ICD-10-CM | POA: Diagnosis not present

## 2022-03-05 DIAGNOSIS — I5042 Chronic combined systolic (congestive) and diastolic (congestive) heart failure: Secondary | ICD-10-CM | POA: Diagnosis not present

## 2022-03-05 DIAGNOSIS — M1030 Gout due to renal impairment, unspecified site: Secondary | ICD-10-CM | POA: Diagnosis not present

## 2022-03-05 DIAGNOSIS — N185 Chronic kidney disease, stage 5: Secondary | ICD-10-CM | POA: Diagnosis not present

## 2022-03-06 ENCOUNTER — Ambulatory Visit (HOSPITAL_BASED_OUTPATIENT_CLINIC_OR_DEPARTMENT_OTHER)
Admission: RE | Admit: 2022-03-06 | Discharge: 2022-03-06 | Disposition: A | Discharge status: Home / Self Care | Payer: Medicare Other | Source: Ambulatory Visit | Attending: Internal Medicine | Admitting: Internal Medicine

## 2022-03-06 ENCOUNTER — Ambulatory Visit (HOSPITAL_COMMUNITY)
Admission: RE | Admit: 2022-03-06 | Discharge: 2022-03-06 | Disposition: A | Discharge status: Home / Self Care | Payer: Medicare Other | Source: Ambulatory Visit | Attending: Internal Medicine | Admitting: Internal Medicine

## 2022-03-06 VITALS — BP 135/70 | HR 73 | Wt 108.0 lb

## 2022-03-06 DIAGNOSIS — I5022 Chronic systolic (congestive) heart failure: Secondary | ICD-10-CM | POA: Diagnosis not present

## 2022-03-06 DIAGNOSIS — I482 Chronic atrial fibrillation, unspecified: Secondary | ICD-10-CM | POA: Insufficient documentation

## 2022-03-06 DIAGNOSIS — R296 Repeated falls: Secondary | ICD-10-CM

## 2022-03-06 DIAGNOSIS — Z950 Presence of cardiac pacemaker: Secondary | ICD-10-CM | POA: Diagnosis not present

## 2022-03-06 DIAGNOSIS — I480 Paroxysmal atrial fibrillation: Secondary | ICD-10-CM | POA: Diagnosis not present

## 2022-03-06 DIAGNOSIS — Z7951 Long term (current) use of inhaled steroids: Secondary | ICD-10-CM | POA: Diagnosis not present

## 2022-03-06 DIAGNOSIS — I132 Hypertensive heart and chronic kidney disease with heart failure and with stage 5 chronic kidney disease, or end stage renal disease: Secondary | ICD-10-CM | POA: Insufficient documentation

## 2022-03-06 DIAGNOSIS — I5042 Chronic combined systolic (congestive) and diastolic (congestive) heart failure: Secondary | ICD-10-CM | POA: Diagnosis not present

## 2022-03-06 DIAGNOSIS — Z8673 Personal history of transient ischemic attack (TIA), and cerebral infarction without residual deficits: Secondary | ICD-10-CM | POA: Insufficient documentation

## 2022-03-06 DIAGNOSIS — Z79624 Long term (current) use of inhibitors of nucleotide synthesis: Secondary | ICD-10-CM | POA: Insufficient documentation

## 2022-03-06 DIAGNOSIS — N186 End stage renal disease: Secondary | ICD-10-CM | POA: Insufficient documentation

## 2022-03-06 DIAGNOSIS — D631 Anemia in chronic kidney disease: Secondary | ICD-10-CM | POA: Diagnosis not present

## 2022-03-06 DIAGNOSIS — N185 Chronic kidney disease, stage 5: Secondary | ICD-10-CM | POA: Diagnosis not present

## 2022-03-06 DIAGNOSIS — Z940 Kidney transplant status: Secondary | ICD-10-CM | POA: Insufficient documentation

## 2022-03-06 DIAGNOSIS — Z86711 Personal history of pulmonary embolism: Secondary | ICD-10-CM | POA: Diagnosis not present

## 2022-03-06 DIAGNOSIS — Z8249 Family history of ischemic heart disease and other diseases of the circulatory system: Secondary | ICD-10-CM | POA: Insufficient documentation

## 2022-03-06 DIAGNOSIS — M1030 Gout due to renal impairment, unspecified site: Secondary | ICD-10-CM | POA: Diagnosis not present

## 2022-03-06 DIAGNOSIS — Z79899 Other long term (current) drug therapy: Secondary | ICD-10-CM | POA: Insufficient documentation

## 2022-03-06 DIAGNOSIS — Z86718 Personal history of other venous thrombosis and embolism: Secondary | ICD-10-CM

## 2022-03-06 DIAGNOSIS — R627 Adult failure to thrive: Secondary | ICD-10-CM | POA: Insufficient documentation

## 2022-03-06 DIAGNOSIS — N2581 Secondary hyperparathyroidism of renal origin: Secondary | ICD-10-CM | POA: Diagnosis not present

## 2022-03-06 LAB — ECHOCARDIOGRAM COMPLETE
AR max vel: 3.35 cm2
AV Peak grad: 4.7 mmHg
Ao pk vel: 1.09 m/s
Area-P 1/2: 3.83 cm2
P 1/2 time: 589 msec
S' Lateral: 3.5 cm
Single Plane A4C EF: 32.7 %

## 2022-03-06 NOTE — Progress Notes (Signed)
ADVANCED HF CLINIC  Referring Physician: Dr. Joan Mayans Primary Cardiologist: Dr. Fransico Him & Dr. Eldred Manges [facility]) Nephrology: Dr Hollie Salk  HPI: [name] is a 76 y.o. 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 Joan Mayans), s/p LAA closure w Watchman device by Dr. Gelene Mink (09/2017), who underwent a successful closure of a crescent shaped perivalvular leak around the previously placed 33 mm watchman device with coiling system on 09/16/2018. Referred by Dr. Joan Mayans for further evaluation and local management of her HF,   Had ASD repair at 76 years of age. Denies h/o known CAD   Admitted to [facility] on 10/22/2019 for acute respiratory failure with hypoxia 2/2 COPD and acute HFpEF exacerbation. BNP 970.7. TTE revealed LVEF 55-60%, mild concentric LVH, RV volume overload, RVSP 60.7 mmHg. Severely dilated LA and RA. Dilated IVC. Marland KitchenDiuresed with IV lasix and discharged on home on Lasix 80 mg BID. Also treated with IV solumedrol, nebulizer and inhaler treatments, and IV Zithromax. Was transitioned to oral prednisone with taper at discharge and instructed to continue home Breo Ellipta and doxycycline.   Readmitted to [facility] on 11/14/2019 for GI bleeding (acute onset painless hematochezia), Hgb 7.4, requiring 2 units of pRBCs. Colonoscopy during the admission revealed diverticulosis. EGD was unremarkable. ASA was held on discharge. D/c with hemoglobin of 9.3.  Hospitalized with embolic stroke 12/5782. Previously off anticoagulants due to GI bleed. Echo 35-40% . Placed on Eliquis. Discharged to Rehab for 6 weeks.   Low dose Eliquis restarted 1/23 at post hospital follow up.  Seen in ED 12/31/21 with fall, had hematoma of R  abd wall at that time.  Hematoma appears to be stable on repeat CT on 01/09/22.  Admitted 7/23 with another fall, no hematoma but developed LLQ pain/hematoma. Found to have acute hematoma. Gen Surgery consulted, no surgical indication. Received PRBCs x 2 and IV iron. Hgb at discharge 7.8.  Today she returns for HF follow up, with her husband. Says she is doing ok. Had multiple falls with her cane and Eliquis stopped. Now trying to use a walker. Cannot ambulate much. When she goes to the store she uses a scooter. Denies SOB, orthopnea, CP, edema. Very weak.  Echo today 03/06/22 EF 40-45% + dyssynchrony Personally reviewed   Cardiac Studies: Echo (3/21) at [facility] EF 40-45% Echo (4/21) at [facility] EF 55-60% RVSP 61 mmHG Echo (1/23): EF 35-40%    ROS: All systems reviewed and negative except as per HPI.   Past Medical History:  Diagnosis Date   Acute diastolic (congestive) heart failure ([place]) 08/12/2017   Acute GI bleeding 11/14/2019   Acute kidney injury superimposed on chronic kidney disease ([place]) 11/17/2017   AKI (acute kidney injury) ([place]) 11/04/2017   Anemia    Anxiety    ARF (acute renal failure) ([place]) 06/06/2011   Arthritis    "shoulders; arms; hips" (02/04/2018)   Asthma    Atrial fibrillation (HCC)    Atrial flutter ([place])    s/p aflutter ablation at [facility]   Bacteriuria, asymptomatic 11/14/2020   Benign hypertension with ESRD (end-stage renal disease) ([place])    Blood transfusion    never had a reaction to blood transfusion   Cardiomyopathy Mar 2013   Mild, EF 50-55%  by Mar 2013 ECHO, diast dysfxn II   Cardiomyopathy ([place])    CHF (congestive heart failure) ([place]) 2005   CHF (congestive heart failure) ([place])    Childhood asthma    Closed fracture of right distal radius 10/18/2016   CMV (cytomegalovirus infection) ([place]) 10/03/2015   Constipation    takes Miralax daily   Constipation    Depression    takes Zoloft daily   Distal radius fracture, right    DVT of lower extremity,  bilateral ([place]) 12/21/11   "they're there now; been there for 2 wks"   Eczema    End stage renal disease ([place]) 11/06/2011   ESRD (end stage renal disease) on dialysis [facility]) 09/2011   07-19-2015 had Kidney transplant at [facility]; "don't get dialysis anymore" (02/04/2018)   Essential hypertension 07/07/2007   Qualifier: Diagnosis of  By: Garen Grams     Fractures, stress    in both feet--6 OR 7 YRS AGO--RESOLVED   Generalized edema [id number]   GI bleed 11/14/2019   Gout    doesn't require meds    HCAP (healthcare-associated pneumonia) 09/10/2011   Hearing difficulty    Hematoma of left lower leg 05/17/2020   High cholesterol    History of CVA (cerebrovascular accident) without residual deficits    History of hip replacement, total, right    History of pneumonia    Hx of Clostridium difficile infection    Hypercalcemia    09/10/11   Hyperparathyroidism ([place]) 04/07/2013   Hypertension    takes Diltiazem daily    Hypomagnesemia 08/02/2015   Hypophosphatemia 11/08/2017   ICB (intracranial bleed) ([place])    Immunosuppression ([place]) 07/25/2015   Memory changes    Morbid obesity ([place])    Nonischemic dilated cardiomyopathy ([place])    Obesity 04/07/2013   Oligouria    Oral mucositis 02/22/2018   OSA on CPAP    PAF (paroxysmal atrial fibrillation) (HCC)     HX OF CEREBRAL BLEED WHILE ON COUMADIN-SO PT NOT ON ANY BLOOD THINNERS NOW   Peripheral vascular disease ([place])    Persistent atrial fibrillation ([place]) 08/12/2017   Overview:  Added automatically from request for surgery [id number] [phone]  Formatting of this note might be different from the original. Added automatically from request for surgery [id number]   Presence of Watchman left atrial appendage closure device 10/04/2017   Proteinuria 09/04/2016   Psoriasis 08/07/2016   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/25/2015   S/P insertion of IVC (inferior vena caval) filter    Sepsis ([place]) 11/08/2017   Stress fracture    bilateral feet   Stroke ([place]) 2009   denies  residual on 02/04/2018;  hemorrhagic now off coumadin   Stroke ([place]) 07/17/2007   denies residual, hemorrhagic now off coumadin   Stroke due to intracerebral hemorrhage ([place]) 2009   Suture reaction 09/04/2016   Transfusion history    Use of cane as ambulatory aid    Current Outpatient Medications  Medication Sig Dispense Refill   acetaminophen (TYLENOL) 500 MG tablet Take 500 mg by mouth every 6 (six) hours as needed for mild pain or headache.      albuterol (PROVENTIL) (2.5 MG/3ML) 0.083% nebulizer solution Take 3 mLs (2.5 mg total) by nebulization every 6 (six) hours as needed for wheezing or shortness of breath. Dx:J45.909 150 mL 2   budesonide-formoterol (SYMBICORT) 160-4.5 MCG/ACT inhaler Inhale 2 puffs into the lungs 2 (two) times daily. 1 each 5   carvedilol (COREG) 3.125 MG tablet Take 1 tablet (3.125 mg total) by  mouth 2 (two) times daily with a meal. 180 tablet 3   docusate sodium (COLACE) 100 MG capsule Take 100 mg by mouth daily.     FLUoxetine (PROZAC) 20 MG capsule 2 qam 60 capsule 2   furosemide (LASIX) 80 MG tablet Take 80 mg by mouth 3 (three) times a week.     HYDROcodone-acetaminophen (NORCO/VICODIN) 5-325 MG tablet Take 1 tablet by mouth every 6 (six) hours as needed. 12 tablet 0   lactulose (CHRONULAC) 10 GM/15ML solution Take 30 mLs (20 g total) by mouth daily as needed for mild constipation or severe constipation. 236 mL 0   mycophenolate (MYFORTIC) 180 MG EC tablet Take 180 mg by mouth 2 (two) times daily.     ondansetron (ZOFRAN) 4 MG tablet Take 1 tablet (4 mg total) by mouth every 8 (eight) hours as needed for up to 9 doses for nausea. 9 tablet 0   oxyCODONE (OXY IR/ROXICODONE) 5 MG immediate release tablet Take 1 tablet (5 mg total) by mouth every 6 (six) hours as needed for severe pain. 10 tablet 0   pantoprazole (PROTONIX) 20 MG tablet TAKE 1 TABLET BY MOUTH EVERY DAY 90 tablet 0   polyethylene glycol (MIRALAX / GLYCOLAX) 17 g packet Take 17 g by mouth daily. Hold if  diarrhea 14 each 0   potassium chloride (MICRO-K) 10 MEQ CR capsule Take 10 mEq by mouth 3 (three) times a week.     predniSONE (DELTASONE) 5 MG tablet Take 5 mg by mouth daily.     tacrolimus (PROGRAF) 1 MG capsule Take 1 mg by mouth 2 (two) times daily.     No current facility-administered medications for this encounter.   Allergies  Allergen Reactions   Warfarin Sodium Other (See Comments)    Caused her to have a stroke/left side of brain to hemorrhage    Ace Inhibitors Cough   Amiodarone Other (See Comments)    Pt with Restrictive Lung Disease by 10/2017 PFT with decreased DLCO   Amlodipine Swelling    Swelling in feet   Social History   Socioeconomic History   Marital status: Married    Spouse name: Not on file   Number of children: Not on file   Years of education: Not on file   Highest education level: Not on file  Occupational History   Occupation: retired  Tobacco Use   Smoking status: Never   Smokeless tobacco: Never  Vaping Use   Vaping Use: Never used  Substance and Sexual Activity   Alcohol use: Never   Drug use: Never   Sexual activity: Yes    Birth control/protection: Post-menopausal  Other Topics Concern   Not on file  Social History Narrative   Married; retired; lives in [place] Strain: [place]  (12/05/2021)   Overall Financial Resource Strain (CARDIA)    Difficulty of Paying Living Expenses: Not hard at all  Food Insecurity: No [place] (12/15/2021)   Hunger Vital Sign    Worried About Running Out of Food in the Last Year: Never true    [place] in the Last Year: Never true  Transportation Needs: No Transportation Needs (12/13/2021)   PRAPARE - Hydrologist (Medical): No    Lack of Transportation (Non-Medical): No  Physical Activity: Inactive (12/05/2021)   Exercise Vital Sign    Days of Exercise per Week: 0 days    Minutes of Exercise  per Session: 0  min  Stress: No Stress Concern Present (12/05/2021)   [place]    Feeling of Stress : Not at all  Social Connections: [place] (12/05/2021)   Social Connection and Isolation Panel [NHANES]    Frequency of Communication with Friends and Family: Twice a week    Frequency of Social Gatherings with Friends and Family: Twice a week    Attends Religious Services: More than 4 times per year    Active Member of [employer] or Organizations: Yes    Attends Archivist Meetings: 1 to 4 times per year    Marital Status: Married  Human resources officer Violence: Not At Risk (12/05/2021)   Humiliation, Afraid, Rape, and Kick questionnaire    Fear of Current or Ex-Partner: No    Emotionally Abused: No    Physically Abused: No    Sexually Abused: No   Family History  Problem Relation Age of Onset   Cancer Father    Diabetes Mother    Hypertension Mother    Arthritis Mother    Hodgkin's lymphoma Other 25       dscd---HODGKINS DISEASE   Hypertension Brother    BP 135/70   Pulse 73   Wt 49 kg (108 lb)   SpO2 93%   BMI 17.97 kg/m   Wt Readings from Last 3 Encounters:  03/06/22 49 kg (108 lb)  02/16/22 50.3 kg (111 lb)  02/08/22 50.4 kg (111 lb 3.2 oz)   PHYSICAL EXAM: General:  Thin elderly female  No resp difficulty, arrived in [facility], frail HEENT: normal Neck: supple. Jvp 7. Carotids 2+ bilat; no bruits. No lymphadenopathy or thryomegaly appreciated. Cor: PMI nondisplaced. Regular rate & rhythm. 2/6 TR 2/6 MR Lungs: clear Abdomen: soft, nontender, nondistended. No hepatosplenomegaly. No bruits or masses. Good bowel sounds. Extremities: no cyanosis, clubbing, rash, edema Neuro: alert & orientedx3, cranial nerves grossly intact. moves all 4 extremities w/o difficulty. Affect pleasant   ECG (personally reviewed): v paced, 70 bpm  ASSESSMENT & PLAN:  1. Chronic systolic HF - unclear etiology. Suspect iCM +/- RV  pacing - not candidate for cath with no anginal symptoms and CKD 3b - Echo 3/21 at [facility] EF 40-45% - Echo 4/21. EF 55-60% - Echo 1/23 35-40%  - Echo today EF 40-45% - NYHA IIIb, limited mostly by stroke and imbalance/weakness  - Continue carvedilol 3.125 mg bid. - No ACE/AR/ARNI with CKD - Continue Lasix 80 mg 3x/week.can take extra as needed  - No room to add Bidil, keep SBP > 120 - Would avoid SGLT2i with incontinence - Given fraility and the fact that I doubt HF is her major limiting factor would not pursue CRT upgrade for pacer  2. Chronic AF - s/p AVN ablation and pacing with single lead device -> ? Previously suggested upgrade to CRT. Follows with EP at [facility] - s/p Watchman device.  - Failed warfarin due to remote hemorrhagic CVA in 2010. - Re-challenged with low dose Eliquis 1/23, now stopped with # 6 (see below)  3. ESRD s/p renal transplant -> now CKD 3b - Followed by Dr Royce Macadamia . - Last Scr 2.97 on 03/01/22 (GFR 15)  4. H/o DVT/PE - s/p IVC filter.  5. CVA, emoblic 07/4479 - Started back on Eliquis 2.5 (1/23) after acute CVA, despite Watchman. - Now off Eliquis with ABLA 2/2 fall & abdominal hematoma. - Keep off Eliquis   6. FTT/Frequent Falls - She is unsteady.  Working with PT at home. - Followed by Palliative Care in the community.  Glori Bickers, MD  3:11 PM .

## 2022-03-06 NOTE — Progress Notes (Signed)
ADVANCED HF CLINIC  Referring Physician: Dr. Joan Mayans Primary Cardiologist: Dr. Fransico Him & Dr. Eldred Manges [facility]) Nephrology: Dr Hollie Salk  HPI: [name] is a 76 y.o. 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 Joan Mayans), s/p LAA closure w Watchman device by Dr. Gelene Mink (09/2017), who underwent a successful closure of a crescent shaped perivalvular leak around the previously placed 33 mm watchman device with coiling system on 09/16/2018. Referred by Dr. Joan Mayans for further evaluation and local management of her HF,   Had ASD repair at 76 years of age. Denies h/o known CAD   Admitted to [facility] on 10/22/2019 for acute respiratory failure with hypoxia 2/2 COPD and acute HFpEF exacerbation. BNP 970.7. TTE revealed LVEF 55-60%, mild concentric LVH, RV volume overload, RVSP 60.7 mmHg. Severely dilated LA and RA. Dilated IVC. Marland KitchenDiuresed with IV lasix and discharged on home on Lasix 80 mg BID. Also treated with IV solumedrol, nebulizer and inhaler treatments, and IV Zithromax. Was transitioned to oral prednisone with taper at discharge and instructed to continue home Breo Ellipta and doxycycline.   Readmitted to [facility] on 11/14/2019 for GI bleeding (acute onset painless hematochezia), Hgb 7.4, requiring 2 units of pRBCs. Colonoscopy during the admission revealed diverticulosis. EGD was unremarkable. ASA was held on discharge. D/c with hemoglobin of 9.3.  Hospitalized with embolic stroke 10/4032. Previously off anticoagulants due to GI bleed. Echo 35-40%. Placed on Eliquis. Discharged to Rehab for 6 weeks.   Low dose Eliquis restarted 1/23 at post hospital follow up.  Seen in ED 12/31/21 with fall, had hematoma of R  abd wall at that time.  Hematoma appears to be stable on repeat CT on 01/09/22.  Admitted 7/23 with another fall, no hematoma but developed LLQ pain/hematoma. Found to have acute hematoma. Gen Surgery consulted, no surgical indication. Received PRBCs x 2 and IV iron. Hgb at discharge 7.8. She was also noted to be hypotensive initially and BP improved w/ IVFs.   Presents today for f/u and repeat echo. Here w/ husband.   Echo EF 35-40%, severe BAE and severe TR   Reports doing relatively well. Ambulates w/ walker. Getting home health PT. Moves slowly to limit falls. She denies any further falls since being discharged. No dyspnea w/ her basic ADLs and moving around her house. BP 135/70. HR well rate controlled in the 70s. Wt stable and down 3 lb since last OV. Takes Lasix 3x/week. No CP.    Cardiac Studies: Echo (3/21) at [facility] EF 40-45% Echo (4/21) at [facility] EF 55-60% RVSP 61 mmHG Echo (1/23): EF 35-40%   Echo (8/23) EF 35-40%, severe TR, severe BAE   ROS: All systems reviewed and negative except as per HPI.   Past Medical History:  Diagnosis Date   Acute diastolic (congestive) heart failure ([place]) 08/12/2017   Acute GI bleeding 11/14/2019   Acute kidney injury superimposed on chronic kidney disease ([place]) 11/17/2017   AKI (acute kidney injury) ([place]) 11/04/2017   Anemia    Anxiety    ARF (acute renal failure) ([place]) 06/06/2011   Arthritis    "shoulders; arms; hips" (02/04/2018)   Asthma    Atrial fibrillation ([place])    Atrial flutter ([place])    s/p aflutter  ablation at [facility]   Bacteriuria, asymptomatic 11/14/2020   Benign hypertension with ESRD (end-stage renal disease) ([place])    Blood transfusion    never had a reaction to blood transfusion   Cardiomyopathy Mar 2013   Mild, EF 50-55% by Mar 2013 ECHO, diast dysfxn II   Cardiomyopathy ([place])    CHF (congestive heart failure) ([place]) 2005   CHF (congestive heart failure) ([place])    Childhood asthma    Closed fracture of right distal radius  10/18/2016   CMV (cytomegalovirus infection) ([place]) 10/03/2015   Constipation    takes Miralax daily   Constipation    Depression    takes Zoloft daily   Distal radius fracture, right    DVT of lower extremity, bilateral ([place]) 12/21/11   "they're there now; been there for 2 wks"   Eczema    End stage renal disease ([employer]) 11/06/2011   ESRD (end stage renal disease) on dialysis [facility]) 09/2011   07-19-2015 had Kidney transplant at [facility]; "don't get dialysis anymore" (02/04/2018)   Essential hypertension 07/07/2007   Qualifier: Diagnosis of  By: Garen Grams     Fractures, stress    in both feet--6 OR 7 YRS AGO--RESOLVED   Generalized edema [id number]   GI bleed 11/14/2019   Gout    doesn't require meds    HCAP (healthcare-associated pneumonia) 09/10/2011   Hearing difficulty    Hematoma of left lower leg 05/17/2020   High cholesterol    History of CVA (cerebrovascular accident) without residual deficits    History of hip replacement, total, right    History of pneumonia    Hx of Clostridium difficile infection    Hypercalcemia    09/10/11   Hyperparathyroidism ([place]) 04/07/2013   Hypertension    takes Diltiazem daily    Hypomagnesemia 08/02/2015   Hypophosphatemia 11/08/2017   ICB (intracranial bleed) ([place])    Immunosuppression ([place]) 07/25/2015   Memory changes    Morbid obesity ([place])    Nonischemic dilated cardiomyopathy ([place])    Obesity 04/07/2013   Oligouria    Oral mucositis 02/22/2018   OSA on CPAP    PAF (paroxysmal atrial fibrillation) (HCC)     HX OF CEREBRAL BLEED WHILE ON COUMADIN-SO PT NOT ON ANY BLOOD THINNERS NOW   Peripheral vascular disease ([place])    Persistent atrial fibrillation ([place]) 08/12/2017   Overview:  Added automatically from request for surgery [id number]  Formatting of this note might be different from the original. Added automatically from request for surgery [id number]   Presence of Watchman left atrial appendage closure device 10/04/2017   Proteinuria 09/04/2016    Psoriasis 08/07/2016   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/25/2015   S/P insertion of IVC (inferior vena caval) filter    Sepsis ([place]) 11/08/2017   Stress fracture    bilateral feet   Stroke ([place]) 2009   denies residual on 02/04/2018;  hemorrhagic now off coumadin   Stroke ([place]) 07/17/2007   denies residual, hemorrhagic now off coumadin   Stroke due to intracerebral hemorrhage ([place]) 2009   Suture reaction 09/04/2016   Transfusion history    Use of cane as ambulatory aid    Current Outpatient Medications  Medication Sig Dispense Refill   acetaminophen (TYLENOL) 500 MG tablet Take 500 mg by mouth every 6 (six) hours as needed for mild pain or headache.      albuterol (PROVENTIL) (2.5 MG/3ML) 0.083% nebulizer solution Take 3 mLs (2.5 mg total) by nebulization every 6 (six) hours as needed for  wheezing or shortness of breath. Dx:J45.909 150 mL 2   budesonide-formoterol (SYMBICORT) 160-4.5 MCG/ACT inhaler Inhale 2 puffs into the lungs 2 (two) times daily. 1 each 5   carvedilol (COREG) 3.125 MG tablet Take 1 tablet (3.125 mg total) by mouth 2 (two) times daily with a meal. 180 tablet 3   docusate sodium (COLACE) 100 MG capsule Take 100 mg by mouth daily.     FLUoxetine (PROZAC) 20 MG capsule 2 qam 60 capsule 2   furosemide (LASIX) 80 MG tablet Take 80 mg by mouth 3 (three) times a week.     HYDROcodone-acetaminophen (NORCO/VICODIN) 5-325 MG tablet Take 1 tablet by mouth every 6 (six) hours as needed. 12 tablet 0   lactulose (CHRONULAC) 10 GM/15ML solution Take 30 mLs (20 g total) by mouth daily as needed for mild constipation or severe constipation. 236 mL 0   mycophenolate (MYFORTIC) 180 MG EC tablet Take 180 mg by mouth 2 (two) times daily.     ondansetron (ZOFRAN) 4 MG tablet Take 1 tablet (4 mg total) by mouth every 8 (eight) hours as needed for up to 9 doses for nausea. 9 tablet 0   oxyCODONE (OXY IR/ROXICODONE) 5 MG immediate release tablet Take 1 tablet (5 mg total) by mouth every 6 (six)  hours as needed for severe pain. 10 tablet 0   pantoprazole (PROTONIX) 20 MG tablet TAKE 1 TABLET BY MOUTH EVERY DAY 90 tablet 0   polyethylene glycol (MIRALAX / GLYCOLAX) 17 g packet Take 17 g by mouth daily. Hold if diarrhea 14 each 0   potassium chloride (MICRO-K) 10 MEQ CR capsule Take 10 mEq by mouth 3 (three) times a week.     predniSONE (DELTASONE) 5 MG tablet Take 5 mg by mouth daily.     tacrolimus (PROGRAF) 1 MG capsule Take 1 mg by mouth 2 (two) times daily.     No current facility-administered medications for this encounter.   Allergies  Allergen Reactions   Warfarin Sodium Other (See Comments)    Caused her to have a stroke/left side of brain to hemorrhage    Ace Inhibitors Cough   Amiodarone Other (See Comments)    Pt with Restrictive Lung Disease by 10/2017 PFT with decreased DLCO   Amlodipine Swelling    Swelling in feet   Social History   Socioeconomic History   Marital status: Married    Spouse name: Not on file   Number of children: Not on file   Years of education: Not on file   Highest education level: Not on file  Occupational History   Occupation: retired  Tobacco Use   Smoking status: Never   Smokeless tobacco: Never  Vaping Use   Vaping Use: Never used  Substance and Sexual Activity   Alcohol use: Never   Drug use: Never   Sexual activity: Yes    Birth control/protection: Post-menopausal  Other Topics Concern   Not on file  Social History Narrative   Married; retired; lives in [place] Strain: [place]  (12/05/2021)   Overall Financial Resource Strain (CARDIA)    Difficulty of Paying Living Expenses: Not hard at all  Food Insecurity: No [place] (12/15/2021)   Hunger Vital Sign    Worried About Running Out of Food in the Last Year: Never true    [place] in the Last Year: Never true  Transportation Needs: No Transportation Needs (12/13/2021)   [place] -  Armed forces logistics/support/administrative officer (Medical): No    Lack of Transportation (Non-Medical): No  Physical Activity: Inactive (12/05/2021)   Exercise Vital Sign    Days of Exercise per Week: 0 days    Minutes of Exercise per Session: 0 min  Stress: No Stress Concern Present (12/05/2021)   [place]    Feeling of Stress : Not at all  Social Connections: [place] (12/05/2021)   Social Connection and Isolation Panel [NHANES]    Frequency of Communication with Friends and Family: Twice a week    Frequency of Social Gatherings with Friends and Family: Twice a week    Attends Religious Services: More than 4 times per year    Active Member of [employer] or Organizations: Yes    Attends Archivist Meetings: 1 to 4 times per year    Marital Status: Married  Human resources officer Violence: Not At Risk (12/05/2021)   Humiliation, Afraid, Rape, and Kick questionnaire    Fear of Current or Ex-Partner: No    Emotionally Abused: No    Physically Abused: No    Sexually Abused: No   Family History  Problem Relation Age of Onset   Cancer Father    Diabetes Mother    Hypertension Mother    Arthritis Mother    Hodgkin's lymphoma Other 23       dscd---HODGKINS DISEASE   Hypertension Brother    BP 135/70   Pulse 73   Wt 49 kg (108 lb)   SpO2 93%   BMI 17.97 kg/m   Wt Readings from Last 3 Encounters:  03/06/22 49 kg (108 lb)  02/16/22 50.3 kg (111 lb)  02/08/22 50.4 kg (111 lb 3.2 oz)   PHYSICAL EXAM: General:  thin/ frail appearing elderly F. No respiratory difficulty HEENT: normal Neck: supple. no JVD. Carotids 2+ bilat; no bruits. No lymphadenopathy or thyromegaly appreciated. Cor: PMI nondisplaced. Irregularly irregular rhythm. 2/6 TR murmur  Lungs: clear Abdomen: soft, nontender, nondistended. No hepatosplenomegaly. No bruits or masses. Good bowel sounds. Extremities: no cyanosis, clubbing, rash, trace ankle edema on the Rt  (chronic). No edema on the left  Neuro: alert & oriented x 3, cranial nerves grossly intact. moves all 4 extremities w/o difficulty. Affect pleasant.   ECG (personally reviewed): not performed   ASSESSMENT & PLAN: 1. Chronic combined systolic?diastolic HF - [facility] 7/51 at [facility] EF 40-45% - Echo 4/21. EF 55-60% - Echo 1/23 35-40%  - Echo today EF 35-40%. Has ICD  - NYHA III (chronic), mobility limited by residual deficits of prior stroke/ deconditioning.  - Euvolemic on exam today, Continue Lasix 80 mg 3x/week Reminded her to take Kcl supp - GDMT limited by CKD  - Continue carvedilol 3.125 mg bid - Hesitant to add Bidil given h/o orthostatic hypotension and falls  - SGLT2i with GFR < 20   2. Chronic AF - s/p AVN ablation and pacing with single lead device -> ? Need to upgrade to CRT. Follows with [facility] - s/p Watchman device.  - Failed warfarin due to remote hemorrhagic CVA in 2010. - Re-challenged with low dose Eliquis 1/23, now stopped with # 6 (see below)  3. ESRD s/p renal transplant -> now CKD 3b - Followed by Dr Royce Macadamia - BMET today.   4. H/o DVT/PE - s/p IVC filter.  5. CVA, emoblic 0/2585 - Started back on Eliquis 2.5 (1/23) after acute CVA, despite Watchman. - Now off  Eliquis with ABLA 2/2 fall & abdominal hematoma - Stop Eliquis. Discussed with Dr. Haroldine Laws  6. FTT/Frequent Falls - She is unsteady. Working with PT at home. Balance improving  - Followed by Palliative Care in the community. - Stopped Eliquis as above.  7. Severe TR - functional, severe RAE  - medical management   Lyda Jester, PA-C  12:34 PM

## 2022-03-06 NOTE — Patient Instructions (Signed)
Changes to your medications  Please wear compression stockings   Your physician recommends that you schedule a follow-up appointment in: 9 months(May 2024) Call office in March to schedule an appointment  If you have any questions or concerns before your next appointment please send [name] a message through [place] or call our office at [phone].    TO LEAVE A MESSAGE FOR THE NURSE SELECT OPTION 2, PLEASE LEAVE A MESSAGE INCLUDING: YOUR NAME DATE OF BIRTH CALL BACK NUMBER REASON FOR CALL**this is important as we prioritize the call backs  YOU WILL RECEIVE A CALL BACK THE SAME DAY AS LONG AS YOU CALL BEFORE 4:00 PM  At the [facility], you and your health needs are our priority. As part of our continuing mission to provide you with exceptional heart care, we have created designated Provider Care Teams. These Care Teams include your primary Cardiologist (physician) and Advanced Practice Providers (APPs- Physician Assistants and Nurse Practitioners) who all work together to provide you with the care you need, when you need it.   You may see any of the following providers on your designated Care Team at your next follow up: Dr Glori Bickers Dr Haynes Kerns, NP Lyda Jester, [place] [facility] [place], [place] Audry Riles, PharmD   Please be sure to bring in all your medications bottles to every appointment.

## 2022-03-07 ENCOUNTER — Encounter (HOSPITAL_COMMUNITY)
Admission: RE | Admit: 2022-03-07 | Discharge: 2022-03-07 | Disposition: A | Discharge status: Home / Self Care | Payer: Medicare Other | Source: Ambulatory Visit | Attending: Nephrology | Admitting: Nephrology

## 2022-03-07 VITALS — BP 145/74 | HR 70 | Temp 97.3°F | Resp 16

## 2022-03-07 DIAGNOSIS — N185 Chronic kidney disease, stage 5: Secondary | ICD-10-CM

## 2022-03-07 DIAGNOSIS — I1311 Hypertensive heart and chronic kidney disease without heart failure, with stage 5 chronic kidney disease, or end stage renal disease: Secondary | ICD-10-CM | POA: Diagnosis not present

## 2022-03-07 LAB — IRON AND TIBC
Iron: 75 ug/dL (ref 28–170)
Saturation Ratios: 42 % — ABNORMAL HIGH (ref 10.4–31.8)
TIBC: 178 ug/dL — ABNORMAL LOW (ref 250–450)
UIBC: 103 ug/dL

## 2022-03-07 LAB — FERRITIN: Ferritin: 897 ng/mL — ABNORMAL HIGH (ref 11–307)

## 2022-03-07 MED ORDER — EPOETIN ALFA-EPBX 40000 UNIT/ML IJ SOLN: 20000.0000 [IU] | INTRAMUSCULAR | Status: DC

## 2022-03-07 MED ORDER — EPOETIN ALFA 20000 UNIT/ML IJ SOLN
INTRAMUSCULAR | Status: AC
  Administered 2022-03-07: 20000 [IU] via SUBCUTANEOUS
  Filled 2022-03-07: qty 1

## 2022-03-07 MED ORDER — EPOETIN ALFA 20000 UNIT/ML IJ SOLN: 20000.0000 [IU] | Freq: Once | INTRAMUSCULAR | Status: AC

## 2022-03-08 ENCOUNTER — Ambulatory Visit: Payer: Medicare Other | Admitting: Psychology

## 2022-03-08 LAB — POCT HEMOGLOBIN-HEMACUE: Hemoglobin: 11.2 g/dL — ABNORMAL LOW (ref 12.0–15.0)

## 2022-03-12 DIAGNOSIS — I132 Hypertensive heart and chronic kidney disease with heart failure and with stage 5 chronic kidney disease, or end stage renal disease: Secondary | ICD-10-CM | POA: Diagnosis not present

## 2022-03-12 DIAGNOSIS — N2581 Secondary hyperparathyroidism of renal origin: Secondary | ICD-10-CM | POA: Diagnosis not present

## 2022-03-12 DIAGNOSIS — I5042 Chronic combined systolic (congestive) and diastolic (congestive) heart failure: Secondary | ICD-10-CM | POA: Diagnosis not present

## 2022-03-12 DIAGNOSIS — N185 Chronic kidney disease, stage 5: Secondary | ICD-10-CM | POA: Diagnosis not present

## 2022-03-12 DIAGNOSIS — D631 Anemia in chronic kidney disease: Secondary | ICD-10-CM | POA: Diagnosis not present

## 2022-03-12 DIAGNOSIS — M1030 Gout due to renal impairment, unspecified site: Secondary | ICD-10-CM | POA: Diagnosis not present

## 2022-03-12 NOTE — Progress Notes (Unsigned)
[facility] [address] [place]. [place] [address] [phone]       STROKE FOLLOW UP NOTE  [name] Date of Birth:  July 28, 1945 Medical Record Number:  [id number]   Primary neurologist: Dr. Leonie [name] Reason for Referral: stroke follow up    SUBJECTIVE:   CHIEF COMPLAINT:  Chief Complaint  Patient presents with   stroke follow up    Pt is well. No major questions or concerns. Room 2 with husband    HPI:   Update 03/13/2022 JM: Patient returns for 44-monthstroke follow-up accompanied by her husband.  She has been stable from a stroke standpoint without new stroke/TIA symptoms. Continued gait difficulty and imbalance, ambulates with RW, has been trying to ambulate with a cane but still unsteady. Some short term memory loss but improving. [facility]. She is frustrated that she can no longer cook or drive.   Did have a fall back in June and July resulting in abdominal wall hematomas with acute blood loss anemia requiring transfusions.  Eliquis placed on hold and advised to follow-up with cardiology. F/u with cardiology last month who recommended to continue to hold Eliquis in setting of frequent falls and ABLA.   She is taking aspirin '81mg'$  daily and atorvastatin '20mg'$  daily, denies side effects. Blood pressure today 140/72. HH monitors and has been stable.   Has f/u visit next month with PCP  No further concerns at this time     History provided for reference purposes only Initial visit 11/08/2021 JM: Patient is being seen for initial hospital follow-up accompanied by her husband, [name]  [facility]  Patient notes continued leg weakness R>L.  [facility] and [facility].  Uses cane for ambulation, denies any falls.  She does continue to struggle with depression and at times has difficulty getting herself out of bed but other days feels great and  has more energy.  She also notes short-term memory difficulties.  [facility] for cognitive impairment and mildly slurred speech and occasional word finding difficulty.  Depression currently managed by PCP currently on fluoxetine and routinely follows with psychology.  Denies new stroke/TIA symptoms.  Remains on atorvastatin, denies side effects. Has not been able to start Eliquis due to financial reasons ($400/month).  Has follow-up visit in 2 weeks with cardiologist Dr. TRadford Pax  She is currently taking aspirin 81 mg daily.  She does have hx of ICH on warfarin in the past, husband unsure if she has been on Xarelto previously but medication "sounds familiar" to him.  Blood pressure today 128/75.  Monitors at home which has ben stable. Is not currently using CPAP machine, does not believe she has been using over the past year, reports tolerating CPAP well but has just "been lazy", she feels like she sleeps fine and did not think it was needed.  No further concerns at this time.  Stroke admission 07/23/2021 Ms. Jane BOULAYis a 76y.o. female with history of hx of ESRD s/p renal transplant in 2017, [place], Afib s/p ablation and LAA closure not on AC, AVB s/p PPM, DVT/PE s/p IVC filter, duodenal ulcer, diastolic CHF, asthma, OSA, depression and recent hospitalziation for BLA due to GI bleed who presented on 07/23/2021 with RLE weakness, slurred speech, and confusion.  Stroke work-up revealed left thalamus and bilateral frontal small infarcts likely secondary to A-fib not on AC.  MRA head/neck negative LVO or high-grade stenosis.  EF 35 to 40%.  LE Doppler negative for DVT.  LDL 71.  A1c 4.7.  On aspirin PTA and recommended Eliquis 2.5 mg twice daily once cleared by GI.  Initiate atorvastatin 20 mg daily.  Per therapy recommendations, she was discharged to SNF for ongoing therapy needs.    PERTINENT IMAGING  Per hospitalization 07/23/2021 Code Stroke CT head No acute abnormality. Advanced chronic  ischemic disease as well as small vessel disease in L thalamus and pons. ASPECTS 10.    MRI  acute infarct in L thalamus, punctate aute infarcts in bilateral frontal lobes consistent with cardioembolic etiology, moderate chronic micorvascular ischemic disease MRA head and neck no LVO or high grade stenosis 2D Echo EF 35-40% Lower Extremity US: no evidence of DVT bilaterally LDL 71 HgbA1c 4.7    ROS:   14 system review of systems performed and negative with exception of those listed in HPI  PMH:  Past Medical History:  Diagnosis Date   Acute diastolic (congestive) heart failure ([place]) 08/12/2017   Acute GI bleeding 11/14/2019   Acute kidney injury superimposed on chronic kidney disease ([place]) 11/17/2017   AKI (acute kidney injury) ([place]) 11/04/2017   Anemia    Anxiety    ARF (acute renal failure) ([place]) 06/06/2011   Arthritis    "shoulders; arms; hips" (02/04/2018)   Asthma    Atrial fibrillation (HCC)    Atrial flutter ([place])    s/p aflutter ablation at [facility]   Bacteriuria, asymptomatic 11/14/2020   Benign hypertension with ESRD (end-stage renal disease) ([place])    Blood transfusion    never had a reaction to blood transfusion   Cardiomyopathy Mar 2013   Mild, EF 50-55% by Mar 2013 ECHO, diast dysfxn II   Cardiomyopathy ([place])    CHF (congestive heart failure) ([place]) 2005   CHF (congestive heart failure) ([place])    Childhood asthma    Closed fracture of right distal radius 10/18/2016   CMV (cytomegalovirus infection) ([place]) 10/03/2015   Constipation    takes Miralax daily   Constipation    Depression    takes Zoloft daily   Distal radius fracture, right    DVT of lower extremity, bilateral ([place]) 12/21/11   "they're there now; been there for 2 wks"   Eczema    End stage renal disease ([place]) 11/06/2011   ESRD (end stage renal disease) on dialysis [facility]) 09/2011   07-19-2015 had Kidney transplant at [facility]; "don't get dialysis anymore" (02/04/2018)   Essential hypertension 07/07/2007    Qualifier: Diagnosis of  By: Garen Grams     Fractures, stress    in both feet--6 OR 7 YRS AGO--RESOLVED   Generalized edema [id number]   GI bleed 11/14/2019   Gout    doesn't require meds    HCAP (healthcare-associated pneumonia) 09/10/2011   Hearing difficulty    Hematoma of left lower leg 05/17/2020   High cholesterol    History of CVA (cerebrovascular accident) without residual deficits    History of hip replacement, total, right    History of pneumonia    Hx of Clostridium difficile infection    Hypercalcemia    09/10/11   Hyperparathyroidism ([place]) 04/07/2013   Hypertension    takes Diltiazem daily    Hypomagnesemia 08/02/2015   Hypophosphatemia 11/08/2017   ICB (intracranial bleed) ([place])    Immunosuppression ([place]) 07/25/2015   Memory changes    Morbid obesity ([place])    Nonischemic dilated cardiomyopathy ([place])  Obesity 04/07/2013   Oligouria    Oral mucositis 02/22/2018   OSA on CPAP    PAF (paroxysmal atrial fibrillation) (HCC)     HX OF CEREBRAL BLEED WHILE ON COUMADIN-SO PT NOT ON ANY BLOOD THINNERS NOW   Peripheral vascular disease ([place])    Persistent atrial fibrillation ([place]) 08/12/2017   Overview:  Added automatically from request for surgery [id number]  Formatting of this note might be different from the original. Added automatically from request for surgery [id number]   Presence of Watchman left atrial appendage closure device 10/04/2017   Proteinuria 09/04/2016   Psoriasis 08/07/2016   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/25/2015   S/P insertion of IVC (inferior vena caval) filter    Sepsis ([place]) 11/08/2017   Stress fracture    bilateral feet   Stroke ([place]) 2009   denies residual on 02/04/2018;  hemorrhagic now off coumadin   Stroke ([place]) 07/17/2007   denies residual, hemorrhagic now off coumadin   Stroke due to intracerebral hemorrhage ([place]) 2009   Suture reaction 09/04/2016   Transfusion history    Use of cane as ambulatory aid     PSH:  Past Surgical History:   Procedure Laterality Date   AV FISTULA PLACEMENT  09/28/2011   Procedure: ARTERIOVENOUS (AV) FISTULA CREATION;  Surgeon: Rosetta Posner, MD;  Location: [facility];  Service: Vascular;  Laterality: Left;   BIOPSY  07/10/2021   Procedure: BIOPSY;  Surgeon: Irving Copas., MD;  Location[facility];  Service: Gastroenterology;;   CARDIAC CATHETERIZATION     CARDIAC VALVE [place] DEFECT   CHOLECYSTECTOMY     1980's   COLONOSCOPY     COLONOSCOPY WITH PROPOFOL N/A 11/16/2019   Procedure: COLONOSCOPY WITH PROPOFOL;  Surgeon: Juanita Craver, MD;  Location: [facility];  Service: Endoscopy;  Laterality: N/A;   CYSTOSCOPY     many yrs ago   ESOPHAGOGASTRODUODENOSCOPY N/A 07/10/2021   Procedure: ESOPHAGOGASTRODUODENOSCOPY (EGD);  Surgeon: Irving Copas., MD;  Location: [facility];  Service: Gastroenterology;  Laterality: N/A;   ESOPHAGOGASTRODUODENOSCOPY (EGD) WITH PROPOFOL N/A 11/16/2019   Procedure: ESOPHAGOGASTRODUODENOSCOPY (EGD) WITH PROPOFOL;  Surgeon: Juanita Craver, MD;  Location: [facility] ENDOSCOPY;  Service: Endoscopy;  Laterality: N/A;   ESOPHAGOGASTRODUODENOSCOPY (EGD) WITH PROPOFOL N/A 03/20/2021   Procedure: ESOPHAGOGASTRODUODENOSCOPY (EGD) WITH PROPOFOL;  Surgeon: Milus Banister, MD;  Location: [facility]OSCOPY;  Service: Endoscopy;  Laterality: N/A;   FEMUR FRACTURE SURGERY Right 03/2011; 09/03/2011   "had 2, 2 wk apart in 2012; broke it again 08/2011 & had OR"   [place] N/A 07/10/2021   Procedure: FLEXIBLE SIGMOIDOSCOPY;  Surgeon: Rush Landmark Telford Nab., MD;  Location: [facility];  Service: Gastroenterology;  Laterality: N/A;   FRACTURE SURGERY     GIVENS CAPSULE STUDY N/A 07/11/2021   Procedure: GIVENS CAPSULE STUDY;  Surgeon: Juanita Craver, MD;  [facility];  Service: Endoscopy;  Laterality: N/A;   HEMOSTASIS CONTROL  03/20/2021   Procedure: HEMOSTASIS CONTROL;  Surgeon: Milus Banister, MD;  Location: [place];  Service:  Endoscopy;;   HOT HEMOSTASIS N/A 03/20/2021   Procedure: HOT HEMOSTASIS (ARGON PLASMA COAGULATION/BICAP);  Surgeon: Milus Banister, MD;  Location: [facility] ENDOSCOPY;  Service: Endoscopy;  Laterality: N/A;   HOT HEMOSTASIS N/A 07/10/2021   Procedure: HOT HEMOSTASIS (ARGON PLASMA COAGULATION/BICAP);  Surgeon: Irving Copas., MD;  Location[facility];  Service: Gastroenterology;  Laterality: N/A;   I & D EXTREMITY  09/15/2011   Procedure: IRRIGATION AND DEBRIDEMENT EXTREMITY  w REMOVAL OF HARDWARE;  Surgeon: [name], MD;  Location: [place];  Service: Orthopedics;  Laterality: Right;   INCISION AND DRAINAGE HIP  12/21/2011   Procedure: IRRIGATION AND DEBRIDEMENT HIP;  Surgeon: [name], MD;  Location: [facility];  Service: Orthopedics;  Laterality: Right;  I&D RIGHT HIP WITH PLACEMENT ANTIBIOTIC SPACER   INSERTION OF DIALYSIS CATHETER     Procedure: INSERTION OF DIALYSIS CATHETER;  Surgeon: Rosetta Posner, MD;  Location: [facility];  Service: Vascular;  Laterality: Right;   IRRIGATION AND DEBRIDEMENT ABSCESS  12/21/11   right hip   JOINT REPLACEMENT     KIDNEY TRANSPLANT  07/19/15   LAPAROSCOPIC GASTRIC BANDING  2008   LEFT ATRIAL APPENDAGE OCCLUSION  10/04/2017   OPEN REDUCTION INTERNAL FIXATION (ORIF) DISTAL RADIAL FRACTURE Right 10/18/2016   Procedure: OPEN REDUCTION INTERNAL FIXATION (ORIF) RIGHT DISTAL RADIAL FRACTURE;  Surgeon: Marchia Bond, MD;  Location: [facility];  Service: Orthopedics;  Laterality: Right;   SUBMUCOSAL TATTOO INJECTION  07/10/2021   Procedure: SUBMUCOSAL TATTOO INJECTION;  Surgeon: Irving Copas., MD;  Location[facility];  Service: Gastroenterology;;   TOTAL HIP ARTHROPLASTY Right 02/2011   right THA 02/2011, I&D/removal of hardware 09/2011,, repeat I&D Jun 2013, reimplantation R THA 03-26-2012   TOTAL HIP REVISION  03/25/2012   Procedure: TOTAL HIP REVISION;  Surgeon: [name], MD;  [facility]: [facility];  Service: Orthopedics;  Laterality: Right;   Right Total Hip Reimplantation   TUBAL LIGATION     VENA CAVA FILTER PLACEMENT  11/2011    Social History:  Social History   Socioeconomic History   Marital status: Married    Spouse name: Not on file   Number of children: Not on file   Years of education: Not on file   Highest education level: Not on file  Occupational History   Occupation: retired  Tobacco Use   Smoking status: Never   Smokeless tobacco: Never  Vaping Use   Vaping Use: Never used  Substance and Sexual Activity   Alcohol use: Never   Drug use: Never   Sexual activity: Yes    Birth control/protection: Post-menopausal  Other Topics Concern   Not on file  Social History Narrative   Married; retired; lives in [place] Strain: [place]  (12/05/2021)   Overall Financial Resource Strain (CARDIA)    Difficulty of Paying Living Expenses: Not hard at all  Food Insecurity: No [place] (12/15/2021)   Hunger Vital Sign    Worried About Running Out of Food in the Last Year: Never true    [place] in the Last Year: Never true  Transportation Needs: No Transportation Needs (12/13/2021)   PRAPARE - Hydrologist (Medical): No    Lack of Transportation (Non-Medical): No  Physical Activity: Inactive (12/05/2021)   Exercise Vital Sign    Days of Exercise per Week: 0 days    Minutes of Exercise per Session: 0 min  Stress: No Stress Concern Present (12/05/2021)   [place]    Feeling of Stress : Not at all  Social Connections: [place] (12/05/2021)   Social Connection and Isolation Panel [NHANES]    Frequency of Communication with Friends and Family: Twice a week    Frequency of Social Gatherings with Friends and Family: Twice a week    Attends Religious Services:  More than 4 times per year    Active Member of Clubs or Organizations: Yes    Attends  Club or Organization Meetings: 1 to 4 times per year    Marital Status: Married  Human resources officer Violence: Not At Risk (12/05/2021)   Humiliation, Afraid, Rape, and Kick questionnaire    Fear of Current or Ex-Partner: No    Emotionally Abused: No    Physically Abused: No    Sexually Abused: No    Family History:  Family History  Problem Relation Age of Onset   Cancer Father    Diabetes Mother    Hypertension Mother    Arthritis Mother    Hodgkin's lymphoma Other 76       dscd---HODGKINS DISEASE   Hypertension Brother     Medications:   Current Outpatient Medications on File Prior to Visit  Medication Sig Dispense Refill   acetaminophen (TYLENOL) 500 MG tablet Take 500 mg by mouth every 6 (six) hours as needed for mild pain or headache.      albuterol (PROVENTIL) (2.5 MG/3ML) 0.083% nebulizer solution Take 3 mLs (2.5 mg total) by nebulization every 6 (six) hours as needed for wheezing or shortness of breath. Dx:J45.909 150 mL 2   carvedilol (COREG) 3.125 MG tablet Take 1 tablet (3.125 mg total) by mouth 2 (two) times daily with a meal. 180 tablet 3   docusate sodium (COLACE) 100 MG capsule Take 100 mg by mouth daily.     FLUoxetine (PROZAC) 20 MG capsule 2 qam 60 capsule 2   furosemide (LASIX) 80 MG tablet Take 80 mg by mouth 3 (three) times a week.     HYDROcodone-acetaminophen (NORCO/VICODIN) 5-325 MG tablet Take 1 tablet by mouth every 6 (six) hours as needed. 12 tablet 0   lactulose (CHRONULAC) 10 GM/15ML solution Take 30 mLs (20 g total) by mouth daily as needed for mild constipation or severe constipation. 236 mL 0   mycophenolate (MYFORTIC) 180 MG EC tablet Take 180 mg by mouth 2 (two) times daily.     ondansetron (ZOFRAN) 4 MG tablet Take 1 tablet (4 mg total) by mouth every 8 (eight) hours as needed for up to 9 doses for nausea. 9 tablet 0   oxyCODONE (OXY IR/ROXICODONE) 5 MG immediate release tablet Take 1 tablet (5 mg total) by mouth every 6 (six) hours as needed for  severe pain. 10 tablet 0   pantoprazole (PROTONIX) 20 MG tablet TAKE 1 TABLET BY MOUTH EVERY DAY 90 tablet 0   polyethylene glycol (MIRALAX / GLYCOLAX) 17 g packet Take 17 g by mouth daily. Hold if diarrhea 14 each 0   potassium chloride (MICRO-K) 10 MEQ CR capsule Take 10 mEq by mouth 3 (three) times a week.     predniSONE (DELTASONE) 5 MG tablet Take 5 mg by mouth daily.     tacrolimus (PROGRAF) 1 MG capsule Take 1 mg by mouth 2 (two) times daily.     budesonide-formoterol (SYMBICORT) 160-4.5 MCG/ACT inhaler Inhale 2 puffs into the lungs 2 (two) times daily. (Patient not taking: Reported on 03/13/2022) 1 each 5   No current facility-administered medications on file prior to visit.    Allergies:   Allergies  Allergen Reactions   Warfarin Sodium Other (See Comments)    Caused her to have a stroke/left side of brain to hemorrhage    Ace Inhibitors Cough   Amiodarone Other (See Comments)    Pt with Restrictive Lung Disease by 10/2017 PFT with decreased DLCO  Amlodipine Swelling    Swelling in feet      OBJECTIVE:  Physical Exam  Vitals:   03/13/22 0847  BP: (!) 140/72  Pulse: 71  Weight: 108 lb 2 oz (49 kg)  Height: '5\' 5"'$  (1.651 m)   Body mass index is 17.99 kg/m. No results found.   General: Frail very pleasant elderly African-American female, seated, in no evident distress Head: head normocephalic and atraumatic.   Neck: supple with no carotid or supraclavicular bruits Cardiovascular: regular rate and rhythm, no murmurs Musculoskeletal: no deformity Skin:  no rash/petichiae Vascular:  Normal pulses all extremities   Neurologic Exam Mental Status: Awake and fully alert.  Mild dysarthria.  Oriented to place and time. Recent memory impaired and remote memory intact. Attention span, concentration and fund of knowledge appropriate during visit. Mood and affect appropriate.  Cranial Nerves: Pupils equal, briskly reactive to light. Extraocular movements full without  nystagmus. Visual fields full to confrontation. Hearing intact. Facial sensation intact.  Right nasolabial fold flattening.  Tongue, palate moves normally and symmetrically.  Motor: Normal bulk and tone. Normal strength in all tested extremity muscles except mild decreased hand dexterity and R>L  hip flexor weakness Sensory.: intact to touch , pinprick , position and vibratory sensation.  Coordination: Rapid alternating movements normal in all extremities except slightly decreased right hand. Finger-to-nose performed accurately bilaterally and heel-to-shin difficulty performing bilaterally. Gait and Station: Stands from seated position with mild difficulty.  Stance is slightly hunched.  Gait demonstrates slow cautious steps with mild favoring of RLE and use of rollator walker.  Tandem walking heel toe not attempted Reflexes: 1+ and symmetric. Toes downgoing.         ASSESSMENT: [name] is a 76 y.o. year old female with left thalamus and bilateral frontal small infarcts on 07/23/2021 likely secondary to A-fib not on [place]. Vascular risk factors include A-fib s/p ablation and LAA closure not on AC, AVB s/p PPM, DVT/PE s/p IVC filter, diastolic CHF, OSA not on CPAP, hx of ICH in 2009, HTN, HLD and advanced age.  Eliquis initially restarted after prior stroke but unfortunately suffered abdominal hematomas post fall resulting in ABLA requiring transfusions therefore Eliquis discontinued.     PLAN:  Embolic strokes:  Continued mild right-sided weakness, cognitive impairment gait impairment and speech/language impairment with gradual improvement.  Continue working with home health therapies.  Discussed importance of routinely doing exercises at home as advised during therapy sessions and use of AD at all times unless otherwise instructed Continue aspirin '81mg'$  daily and atorvastatin 20 mg daily for secondary stroke prevention.   Discussed secondary stroke prevention measures and importance of close  PCP follow up for aggressive stroke risk factor management including BP goal<130/90, and HLD with LDL goal<70  I have gone over the pathophysiology of stroke, warning signs and symptoms, risk factors and their management in some detail with instructions to go to the closest emergency room for symptoms of concern. Atrial fibrillation: now off Eliquis d/t post fall abdominal hematoma with ABLA per cards recommendations, hx of GI bleed and ICH. Closely followed by cardiology.      Overall stable from stroke standpoint and risk factors are managed by PCP/cardiology. She may follow up PRN, as usual for our patients who are strictly being followed for stroke. If any new neurological issues should arise, request PCP place referral for evaluation by one of our neurologists. Thank you.     CC:  ShePCP: [name], [name], NP  I spent 31 minutes of face-to-face and non-face-to-face time with patient and husband.  This included previsit chart review, lab review, study review, electronic health record documentation, patient and husband education regarding prior stroke including etiology with residual deficits, secondary stroke prevention measures and importance of managing stroke risk factors, and answered all other questions to patient and husband's satisfaction   Frann Rider, AGNP-BC  [facility] [address] [place], [place] [address]  Phone [phone] Fax [phone] Note: This document was prepared with digital dictation and possible smart phrase technology. Any transcriptional errors that result from this process are unintentional.

## 2022-03-13 ENCOUNTER — Other Ambulatory Visit: Payer: Self-pay | Admitting: Physical Medicine and Rehabilitation

## 2022-03-13 ENCOUNTER — Ambulatory Visit (INDEPENDENT_AMBULATORY_CARE_PROVIDER_SITE_OTHER): Payer: Medicare Other | Admitting: Adult Health

## 2022-03-13 ENCOUNTER — Encounter: Payer: Self-pay | Admitting: Adult Health

## 2022-03-13 VITALS — BP 140/72 | HR 71 | Ht 65.0 in | Wt 108.1 lb

## 2022-03-13 DIAGNOSIS — I480 Paroxysmal atrial fibrillation: Secondary | ICD-10-CM

## 2022-03-13 DIAGNOSIS — I63423 Cerebral infarction due to embolism of bilateral anterior cerebral arteries: Secondary | ICD-10-CM | POA: Diagnosis not present

## 2022-03-13 DIAGNOSIS — M545 Low back pain, unspecified: Secondary | ICD-10-CM

## 2022-03-13 DIAGNOSIS — M4120 Other idiopathic scoliosis, site unspecified: Secondary | ICD-10-CM | POA: Diagnosis not present

## 2022-03-13 DIAGNOSIS — M5136 Other intervertebral disc degeneration, lumbar region: Secondary | ICD-10-CM | POA: Diagnosis not present

## 2022-03-13 DIAGNOSIS — M25551 Pain in right hip: Secondary | ICD-10-CM | POA: Diagnosis not present

## 2022-03-13 NOTE — Patient Instructions (Signed)
Continue aspirin 81 mg daily  and atorvastatin '20mg'$  daily  for secondary stroke prevention  Continue to follow up with PCP regarding cholesterol and blood pressure management  Maintain strict control of hypertension with blood pressure goal below 130/90 and cholesterol with LDL cholesterol (bad cholesterol) goal below 70 mg/dL.   Signs of a Stroke? Follow the BEFAST method:  Balance Watch for a sudden loss of balance, trouble with coordination or vertigo Eyes Is there a sudden loss of vision in one or both eyes? Or double vision?  Face: Ask the person to smile. Does one side of the face droop or is it numb?  Arms: Ask the person to raise both arms. Does one arm drift downward? Is there weakness or numbness of a leg? Speech: Ask the person to repeat a simple phrase. Does the speech sound slurred/strange? Is the person confused ? Time: If you observe any of these signs, call 911.       Thank you for coming to see [name] at [facility]. I hope we have been able to provide you high quality care today.  You may receive a patient satisfaction survey over the next few weeks. We would appreciate your feedback and comments so that we may continue to improve ourselves and the health of our patients.

## 2022-03-14 ENCOUNTER — Ambulatory Visit: Payer: Self-pay

## 2022-03-14 DIAGNOSIS — N2581 Secondary hyperparathyroidism of renal origin: Secondary | ICD-10-CM | POA: Diagnosis not present

## 2022-03-14 DIAGNOSIS — M1030 Gout due to renal impairment, unspecified site: Secondary | ICD-10-CM | POA: Diagnosis not present

## 2022-03-14 DIAGNOSIS — I132 Hypertensive heart and chronic kidney disease with heart failure and with stage 5 chronic kidney disease, or end stage renal disease: Secondary | ICD-10-CM | POA: Diagnosis not present

## 2022-03-14 DIAGNOSIS — N185 Chronic kidney disease, stage 5: Secondary | ICD-10-CM | POA: Diagnosis not present

## 2022-03-14 DIAGNOSIS — I5042 Chronic combined systolic (congestive) and diastolic (congestive) heart failure: Secondary | ICD-10-CM | POA: Diagnosis not present

## 2022-03-14 DIAGNOSIS — D631 Anemia in chronic kidney disease: Secondary | ICD-10-CM | POA: Diagnosis not present

## 2022-03-14 NOTE — Patient Outreach (Signed)
  Care Coordination   Follow Up Visit Note   03/14/2022 Name: [name] MRN: [id number] DOB: [birth year]  [name] is a 76 y.o. year old female who sees [name], [name], NP for primary care. I spoke with  [name] by phone today.  What matters to the patients health and wellness today?  Patient reports sustaining a recent fall at the beauty salon. She denies any serious injury.  Patient states she continues to receive home health physical therapy services.  She reports having follow up visit with neurologist on 03/13/22. Patient states she does not feel she needs additional follow up with nurse for care coordination services.    Goals Addressed             This Visit's Progress    COMPLETED: Care coordination activities-       Care Coordination Interventions: Reviewed medications with patient and discussed importance of compliance Reviewed scheduled/upcoming provider appointments  Reviewed signs/ symptoms of atrial fibrillation.  Advise patient to call provider for mild to moderate symptoms and call 911 for severe symptoms.  Reviewed fall precautions with patient. Advised patient to report falls to her provider.  Reviewed signs/ symptoms of stroke. Advised that 911 should be called for stroke like symptoms.           SDOH assessments and interventions completed:  No     Care Coordination Interventions Activated:  Yes  Care Coordination Interventions:  Yes, provided   Follow up plan: No further intervention required. Patient declined ongoing care coordination services.   Encounter Outcome:  Pt. Visit Completed   Quinn Plowman RN,BSN,CCM [place] Coordinator [phone]

## 2022-03-20 DIAGNOSIS — M1030 Gout due to renal impairment, unspecified site: Secondary | ICD-10-CM | POA: Diagnosis not present

## 2022-03-20 DIAGNOSIS — D631 Anemia in chronic kidney disease: Secondary | ICD-10-CM | POA: Diagnosis not present

## 2022-03-20 DIAGNOSIS — N2581 Secondary hyperparathyroidism of renal origin: Secondary | ICD-10-CM | POA: Diagnosis not present

## 2022-03-20 DIAGNOSIS — I132 Hypertensive heart and chronic kidney disease with heart failure and with stage 5 chronic kidney disease, or end stage renal disease: Secondary | ICD-10-CM | POA: Diagnosis not present

## 2022-03-20 DIAGNOSIS — I5042 Chronic combined systolic (congestive) and diastolic (congestive) heart failure: Secondary | ICD-10-CM | POA: Diagnosis not present

## 2022-03-20 DIAGNOSIS — N185 Chronic kidney disease, stage 5: Secondary | ICD-10-CM | POA: Diagnosis not present

## 2022-03-21 ENCOUNTER — Encounter (HOSPITAL_COMMUNITY)
Admission: RE | Admit: 2022-03-21 | Discharge: 2022-03-21 | Disposition: A | Discharge status: Home / Self Care | Payer: Medicare Other | Source: Ambulatory Visit | Attending: Nephrology | Admitting: Nephrology

## 2022-03-21 VITALS — BP 144/78 | HR 71 | Temp 97.4°F | Resp 18

## 2022-03-21 DIAGNOSIS — D631 Anemia in chronic kidney disease: Secondary | ICD-10-CM | POA: Diagnosis not present

## 2022-03-21 DIAGNOSIS — N185 Chronic kidney disease, stage 5: Secondary | ICD-10-CM | POA: Insufficient documentation

## 2022-03-21 DIAGNOSIS — M1030 Gout due to renal impairment, unspecified site: Secondary | ICD-10-CM | POA: Diagnosis not present

## 2022-03-21 DIAGNOSIS — I132 Hypertensive heart and chronic kidney disease with heart failure and with stage 5 chronic kidney disease, or end stage renal disease: Secondary | ICD-10-CM | POA: Diagnosis not present

## 2022-03-21 DIAGNOSIS — N2581 Secondary hyperparathyroidism of renal origin: Secondary | ICD-10-CM | POA: Diagnosis not present

## 2022-03-21 DIAGNOSIS — I5042 Chronic combined systolic (congestive) and diastolic (congestive) heart failure: Secondary | ICD-10-CM | POA: Diagnosis not present

## 2022-03-21 MED ORDER — EPOETIN ALFA-EPBX 10000 UNIT/ML IJ SOLN
INTRAMUSCULAR | Status: AC
  Filled 2022-03-21: qty 1

## 2022-03-21 MED ORDER — EPOETIN ALFA-EPBX 10000 UNIT/ML IJ SOLN
10000.0000 [IU] | INTRAMUSCULAR | Status: DC
  Administered 2022-03-21: 10000 [IU] via SUBCUTANEOUS

## 2022-03-26 ENCOUNTER — Telehealth: Payer: Self-pay

## 2022-03-26 DIAGNOSIS — N2581 Secondary hyperparathyroidism of renal origin: Secondary | ICD-10-CM | POA: Diagnosis not present

## 2022-03-26 DIAGNOSIS — I5042 Chronic combined systolic (congestive) and diastolic (congestive) heart failure: Secondary | ICD-10-CM | POA: Diagnosis not present

## 2022-03-26 DIAGNOSIS — N185 Chronic kidney disease, stage 5: Secondary | ICD-10-CM | POA: Diagnosis not present

## 2022-03-26 DIAGNOSIS — D631 Anemia in chronic kidney disease: Secondary | ICD-10-CM | POA: Diagnosis not present

## 2022-03-26 DIAGNOSIS — M1030 Gout due to renal impairment, unspecified site: Secondary | ICD-10-CM | POA: Diagnosis not present

## 2022-03-26 DIAGNOSIS — I132 Hypertensive heart and chronic kidney disease with heart failure and with stage 5 chronic kidney disease, or end stage renal disease: Secondary | ICD-10-CM | POA: Diagnosis not present

## 2022-03-26 NOTE — Progress Notes (Signed)
Chronic Care Management Pharmacy Assistant   Name: [name]  MRN: [id number] DOB: [birth year]  Reason for Encounter:Hypertension Disease State Call.   Recent office visits:  02/16/2022 Wilfred Lacy NP (PCP)No Medication Changes noted 02/14/2022 Quinn Plowman RN (CCM) No Medication Changes noted 02/08/2022 Wilfred Lacy NP (PCP) Advised to Take furosemide '80mg'$  daily x 3days, then resume '80mg'$  3x/week, Return in about 1 week   Recent consult visits:  03/13/2022 Frann Rider NP (Neurology) No Medication Changes noted 03/06/2022 Dr. Haroldine Laws MD (Cardiology) No Medications Changes noted 02/21/2022 Dr. Casimiro Needle MD (Behavioral health) Unable to see note  Hospital visits:  None in previous 6 months  Medications: Outpatient Encounter Medications as of 03/26/2022  Medication Sig Note   acetaminophen (TYLENOL) 500 MG tablet Take 500 mg by mouth every 6 (six) hours as needed for mild pain or headache.     albuterol (PROVENTIL) (2.5 MG/3ML) 0.083% nebulizer solution Take 3 mLs (2.5 mg total) by nebulization every 6 (six) hours as needed for wheezing or shortness of breath. Dx:J45.909    aspirin EC 81 MG tablet Take 81 mg by mouth daily. Swallow whole.    atorvastatin (LIPITOR) 20 MG tablet Take 20 mg by mouth daily.    budesonide-formoterol (SYMBICORT) 160-4.5 MCG/ACT inhaler Inhale 2 puffs into the lungs 2 (two) times daily. (Patient not taking: Reported on 03/13/2022) 12/13/2021: Patient states she takes only as needed.    carvedilol (COREG) 3.125 MG tablet Take 1 tablet (3.125 mg total) by mouth 2 (two) times daily with a meal.    docusate sodium (COLACE) 100 MG capsule Take 100 mg by mouth daily.    FLUoxetine (PROZAC) 20 MG capsule 2 qam    furosemide (LASIX) 80 MG tablet Take 80 mg by mouth 3 (three) times a week.    HYDROcodone-acetaminophen (NORCO/VICODIN) 5-325 MG tablet Take 1 tablet by mouth every 6 (six) hours as needed.    lactulose (CHRONULAC) 10 GM/15ML solution Take 30 mLs  (20 g total) by mouth daily as needed for mild constipation or severe constipation.    mycophenolate (MYFORTIC) 180 MG EC tablet Take 180 mg by mouth 2 (two) times daily.    ondansetron (ZOFRAN) 4 MG tablet Take 1 tablet (4 mg total) by mouth every 8 (eight) hours as needed for up to 9 doses for nausea.    oxyCODONE (OXY IR/ROXICODONE) 5 MG immediate release tablet Take 1 tablet (5 mg total) by mouth every 6 (six) hours as needed for severe pain.    pantoprazole (PROTONIX) 20 MG tablet TAKE 1 TABLET BY MOUTH EVERY DAY    polyethylene glycol (MIRALAX / GLYCOLAX) 17 g packet Take 17 g by mouth daily. Hold if diarrhea    potassium chloride (MICRO-K) 10 MEQ CR capsule Take 10 mEq by mouth 3 (three) times a week.    predniSONE (DELTASONE) 5 MG tablet Take 5 mg by mouth daily.    tacrolimus (PROGRAF) 1 MG capsule Take 1 mg by mouth 2 (two) times daily.    No facility-administered encounter medications on file as of 03/26/2022.    Care Gaps: COVID-19 Vaccine (4- Booster) Influenza Vaccine   Star Rating Drugs: Atorvastatin 20 mg last filled 02/24/2022 90 day supply at [facility].   Medication Fill Gaps: None  Reviewed chart prior to disease state call. Spoke with patient regarding BP  Recent Office Vitals: BP Readings from Last 3 Encounters:  03/21/22 (!) 144/78  03/13/22 (!) 140/72  03/07/22 (!) 145/74   Pulse Readings from Last  3 Encounters:  03/21/22 71  03/13/22 71  03/07/22 70    Wt Readings from Last 3 Encounters:  03/13/22 108 lb 2 oz (49 kg)  03/06/22 108 lb (49 kg)  02/16/22 111 lb (50.3 kg)     Kidney Function Lab Results  Component Value Date/Time   CREATININE 2.97 (H) 03/01/2022 12:41 PM   CREATININE 2.94 (H) 01/23/2022 04:10 PM   GFR 15.12 (L) 03/01/2022 12:41 PM   GFRNONAA 16 (L) 01/23/2022 04:10 PM   GFRAA 24 (L) 03/13/2020 11:18 AM       Latest Ref Rng & Units 03/01/2022   12:41 PM 01/23/2022    4:10 PM 01/20/2022    7:42 AM  BMP  Glucose 70 - 99 mg/dL  123  96  96   BUN 6 - 23 mg/dL 40  44  43   Creatinine 0.40 - 1.20 mg/dL 2.97  2.94  3.03   Sodium 135 - 145 mEq/L 140  138  137   Potassium 3.5 - 5.1 mEq/L 3.2  3.8  3.7   Chloride 96 - 112 mEq/L 102  104  104   CO2 19 - 32 mEq/L '26  25  24   '$ Calcium 8.4 - 10.5 mg/dL 9.1  9.2  8.8     Current antihypertensive regimen:  Carvedilol 3.125 mg twice daily  Furosemide 80 mg three times weekly  How often are you checking your Blood Pressure? 3-5x per week  Current home BP readings:  Patient husband reports patient blood pressure has been ranging around 130/68-78.  What recent interventions/DTPs have been made by any provider to improve Blood Pressure control since last CPP Visit: None ID  Any recent hospitalizations or ED visits since last visit with CPP? No  What diet changes have been made to improve Blood Pressure Control?  Patient spouse states her appetite has improved some since he has spoken with me. What exercise is being done to improve your Blood Pressure Control?  Patient husband states patient is trying to be more active around the home.   Adherence Review: Is the patient currently on ACE/ARB medication? No Does the patient have >5 day gap between last estimated fill dates? No  Anderson Malta Clinical Production designer, theatre/television/film [phone]

## 2022-03-27 ENCOUNTER — Telehealth: Payer: Self-pay | Admitting: Nurse Practitioner

## 2022-03-27 DIAGNOSIS — N185 Chronic kidney disease, stage 5: Secondary | ICD-10-CM | POA: Diagnosis not present

## 2022-03-27 DIAGNOSIS — D631 Anemia in chronic kidney disease: Secondary | ICD-10-CM | POA: Diagnosis not present

## 2022-03-27 DIAGNOSIS — I132 Hypertensive heart and chronic kidney disease with heart failure and with stage 5 chronic kidney disease, or end stage renal disease: Secondary | ICD-10-CM | POA: Diagnosis not present

## 2022-03-27 DIAGNOSIS — I5042 Chronic combined systolic (congestive) and diastolic (congestive) heart failure: Secondary | ICD-10-CM | POA: Diagnosis not present

## 2022-03-27 DIAGNOSIS — N2581 Secondary hyperparathyroidism of renal origin: Secondary | ICD-10-CM | POA: Diagnosis not present

## 2022-03-27 DIAGNOSIS — M1030 Gout due to renal impairment, unspecified site: Secondary | ICD-10-CM | POA: Diagnosis not present

## 2022-03-27 NOTE — Telephone Encounter (Signed)
Called & left verbal order on VM on number  provided.

## 2022-03-27 NOTE — Telephone Encounter (Signed)
Caller Name: [facility] Call back phone #: [phone]  Reason for Call: Continuing cognitive decline in pt, would like verbal orders for OT once a week for 4 weeks

## 2022-03-28 ENCOUNTER — Telehealth: Payer: Self-pay

## 2022-03-28 ENCOUNTER — Telehealth: Payer: Self-pay | Admitting: Nurse Practitioner

## 2022-03-28 DIAGNOSIS — R404 Transient alteration of awareness: Secondary | ICD-10-CM

## 2022-03-28 NOTE — Progress Notes (Signed)
Chronic Care Management APPOINTMENT REMINDER   Called [name], No answer, left message of appointment on 03/29/2022 at 9:15 am via telephone visit with Junius Argyle , Pharm D. Notified to have all medications, supplements, blood pressure and/or blood sugar logs available during appointment and to return call if need to reschedule.   [place] Pharmacist Assistant [phone]

## 2022-03-28 NOTE — Telephone Encounter (Signed)
Called & spoke w/ pt's husband, says she has not been eating well and has been in the bed for most of the day and doesn't have much energy. He says she is able to get in & out of the shower but everything else has been an issue for her. Pt requested to see Baldo Ash due to a decline in her health. Offered appt for 03/29/22 @ 9am

## 2022-03-28 NOTE — Telephone Encounter (Signed)
Pt's husband, [name] wants Baldo Ash to know she is having trouble consuming food, no energy and she weighed 110lbs yesterday 03/27/22. Please give him a call back @ [phone]

## 2022-03-29 ENCOUNTER — Encounter: Payer: Self-pay | Admitting: Nurse Practitioner

## 2022-03-29 ENCOUNTER — Telehealth: Payer: Medicare Other

## 2022-03-29 ENCOUNTER — Ambulatory Visit (INDEPENDENT_AMBULATORY_CARE_PROVIDER_SITE_OTHER): Payer: Medicare Other | Admitting: Nurse Practitioner

## 2022-03-29 VITALS — BP 160/84 | HR 70 | Temp 97.6°F | Ht 65.0 in | Wt 112.0 lb

## 2022-03-29 DIAGNOSIS — S301XXA Contusion of abdominal wall, initial encounter: Secondary | ICD-10-CM

## 2022-03-29 DIAGNOSIS — F331 Major depressive disorder, recurrent, moderate: Secondary | ICD-10-CM

## 2022-03-29 DIAGNOSIS — E46 Unspecified protein-calorie malnutrition: Secondary | ICD-10-CM

## 2022-03-29 DIAGNOSIS — S301XXS Contusion of abdominal wall, sequela: Secondary | ICD-10-CM

## 2022-03-29 LAB — POCT HEMOGLOBIN-HEMACUE: Hemoglobin: 10.7 g/dL — ABNORMAL LOW (ref 12.0–15.0)

## 2022-03-29 NOTE — Patient Instructions (Signed)
Maintain fluoxetine dose at '40mg'$  daily. Drink 2bottles of boost per day. F/up in 54month

## 2022-03-29 NOTE — Progress Notes (Unsigned)
Established Patient Visit  Patient: [name]   DOB: 1945/12/26   76 y.o. Female  MRN: [id number] Visit Date: 03/30/2022  Subjective:    Chief Complaint  Patient presents with   Acute Visit    C/o loss of appetite & energy     HPI Depression Stable mood with fluoxetine Under the care of Dr. Casimiro Needle  Protein-calorie malnutrition [facility]) Possibly secondary to depression and multiple chronic conditions: ESRD, CHF  She reports persistent poor appetite and early satiety. She acknowledges lack of energy to complete ADLs and easily gets tired with minimal exertion. She has had frequent falls within the last 84month due to poor gait and generalized weakness. Her husband states she will ask for food or is offered different meals/snacks, but losses interest after a few bites. She does not drink boost BID as recommended. She denies any constipation or nausea or ABD pain.  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   CT ABD 01/2022: diffuse anasarca, Large hematoma within the superficial soft tissues along the anterior aspect of the right hip and right iliac region, increased in size when compared to the prior study. Interval development of an additional large hematoma along the anterior aspect of the symphysis pubis and left hip. Interval decrease in size of the hematoma seen within the superficial soft tissues of the anterolateral aspect of the proximal right thigh on the prior exam. Moderate to marked severity cardiomegaly. Colonic diverticulosis. stable bilateral hemorrhagic and/or proteinaceous renal cysts. MRI correlation is recommended, as an underlying neoplastic process cannot be excluded. Chronic compression fracture deformities at the levels of L1 and L2 with multilevel degenerative changes throughout the lumbar spine. Right hip replacement with streak artifact and limited evaluation of the adjacent osseous and soft tissue  structures.  Albumin: varies from 2.1 to 3.6 in the last 8104monthWt Readings from Last 3 Encounters:  03/29/22 112 lb (50.8 kg)  03/13/22 108 lb 2 oz (49 kg)  03/06/22 108 lb (49 kg)      Latest Ref Rng & Units 03/01/2022   12:41 PM 01/23/2022    4:10 PM 01/20/2022    7:42 AM  BMP  Glucose 70 - 99 mg/dL 123  96  96   BUN 6 - 23 mg/dL 40  44  43   Creatinine 0.40 - 1.20 mg/dL 2.97  2.94  3.03   Sodium 135 - 145 mEq/L 140  138  137   Potassium 3.5 - 5.1 mEq/L 3.2  3.8  3.7   Chloride 96 - 112 mEq/L 102  104  104   CO2 19 - 32 mEq/L '26  25  24   '$ Calcium 8.4 - 10.5 mg/dL 9.1  9.2  8.8    Encourage [name] eat small frequent meals and drink 2bottles of boost per day. F/up in 4m94monthheck prealbumin)    Reviewed medical, surgical, and social history today  Medications: Outpatient Medications Prior to Visit  Medication Sig   acetaminophen (TYLENOL) 500 MG tablet Take 500 mg by mouth every 6 (six) hours as needed for mild pain or headache.    albuterol (PROVENTIL) (2.5 MG/3ML) 0.083% nebulizer solution Take 3 mLs (2.5 mg total) by nebulization every 6 (six) hours as needed for wheezing or shortness of breath. Dx:J45.909   aspirin EC 81 MG tablet Take 81 mg by  mouth daily. Swallow whole.   atorvastatin (LIPITOR) 20 MG tablet Take 20 mg by mouth daily.   budesonide-formoterol (SYMBICORT) 160-4.5 MCG/ACT inhaler Inhale 2 puffs into the lungs 2 (two) times daily.   carvedilol (COREG) 3.125 MG tablet Take 1 tablet (3.125 mg total) by mouth 2 (two) times daily with a meal.   docusate sodium (COLACE) 100 MG capsule Take 100 mg by mouth daily.   FLUoxetine (PROZAC) 20 MG capsule 2 qam   furosemide (LASIX) 80 MG tablet Take 80 mg by mouth 3 (three) times a week.   HYDROcodone-acetaminophen (NORCO/VICODIN) 5-325 MG tablet Take 1 tablet by mouth every 6 (six) hours as needed.   lactulose (CHRONULAC) 10 GM/15ML solution Take 30 mLs (20 g total) by mouth daily as needed for mild constipation  or severe constipation.   mycophenolate (MYFORTIC) 180 MG EC tablet Take 180 mg by mouth 2 (two) times daily.   ondansetron (ZOFRAN) 4 MG tablet Take 1 tablet (4 mg total) by mouth every 8 (eight) hours as needed for up to 9 doses for nausea.   oxyCODONE (OXY IR/ROXICODONE) 5 MG immediate release tablet Take 1 tablet (5 mg total) by mouth every 6 (six) hours as needed for severe pain.   pantoprazole (PROTONIX) 20 MG tablet TAKE 1 TABLET BY MOUTH EVERY DAY   polyethylene glycol (MIRALAX / GLYCOLAX) 17 g packet Take 17 g by mouth daily. Hold if diarrhea   potassium chloride (MICRO-K) 10 MEQ CR capsule Take 10 mEq by mouth 3 (three) times a week.   predniSONE (DELTASONE) 5 MG tablet Take 5 mg by mouth daily.   tacrolimus (PROGRAF) 1 MG capsule Take 1 mg by mouth 2 (two) times daily.   No facility-administered medications prior to visit.   Reviewed past medical and social history.   ROS per HPI above  {Show previous labs (optional):23779}    Objective:  BP (!) 160/84 (BP Location: Right Arm, Patient Position: Sitting, Cuff Size: Small)   Pulse 70   Temp 97.6 F (36.4 C) (Temporal)   Ht '5\' 5"'$  (1.651 m)   Wt 112 lb (50.8 kg)   SpO2 96%   BMI 18.64 kg/m      Physical Exam Constitutional:      General: She is not in acute distress. Cardiovascular:     Rate and Rhythm: Normal rate.     Pulses: Normal pulses.  Pulmonary:     Effort: Pulmonary effort is normal.  Abdominal:     General: Bowel sounds are normal. There is no distension.     Palpations: Abdomen is soft.     Tenderness: There is no abdominal tenderness. There is no guarding.  Neurological:     Mental Status: She is alert and oriented to person, place, and time.  Psychiatric:        Attention and Perception: Attention normal.        Mood and Affect: Affect is flat.        Speech: Speech normal.        Behavior: Behavior is cooperative.        Thought Content: Thought content normal.        Cognition and Memory:  Cognition and memory normal.        Judgment: Judgment normal.     No results found for any visits on 03/29/22.    Assessment & Plan:    Problem List Items Addressed This Visit       Other   Depression - Primary  Stable mood with fluoxetine Under the care of Dr. Casimiro Needle      Protein-calorie malnutrition [facility])    Possibly secondary to depression and multiple chronic conditions: ESRD, CHF  She reports persistent poor appetite and early satiety. She acknowledges lack of energy to complete ADLs and easily gets tired with minimal exertion. She has had frequent falls within the last 8month due to poor gait and generalized weakness. Her husband states she will ask for food or is offered different meals/snacks, but losses interest after a few bites. She does not drink boost BID as recommended. She denies any constipation or nausea or ABD pain.  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   CT ABD 01/2022: diffuse anasarca, Large hematoma within the superficial soft tissues along the anterior aspect of the right hip and right iliac region, increased in size when compared to the prior study. Interval development of an additional large hematoma along the anterior aspect of the symphysis pubis and left hip. Interval decrease in size of the hematoma seen within the superficial soft tissues of the anterolateral aspect of the proximal right thigh on the prior exam. Moderate to marked severity cardiomegaly. Colonic diverticulosis. stable bilateral hemorrhagic and/or proteinaceous renal cysts. MRI correlation is recommended, as an underlying neoplastic process cannot be excluded. Chronic compression fracture deformities at the levels of L1 and L2 with multilevel degenerative changes throughout the lumbar spine. Right hip replacement with streak artifact and limited evaluation of the adjacent osseous and soft tissue structures.  Albumin: varies from 2.1  to 3.6 in the last 832monthWt Readings from Last 3 Encounters:  03/29/22 112 lb (50.8 kg)  03/13/22 108 lb 2 oz (49 kg)  03/06/22 108 lb (49 kg)      Latest Ref Rng & Units 03/01/2022   12:41 PM 01/23/2022    4:10 PM 01/20/2022    7:42 AM  BMP  Glucose 70 - 99 mg/dL 123  96  96   BUN 6 - 23 mg/dL 40  44  43   Creatinine 0.40 - 1.20 mg/dL 2.97  2.94  3.03   Sodium 135 - 145 mEq/L 140  138  137   Potassium 3.5 - 5.1 mEq/L 3.2  3.8  3.7   Chloride 96 - 112 mEq/L 102  104  104   CO2 19 - 32 mEq/L '26  25  24   '$ Calcium 8.4 - 10.5 mg/dL 9.1  9.2  8.8    Encourage Ms. LuNolteo eat small frequent meals and drink 2bottles of boost per day. F/up in 51m14monthheck prealbumin)      Return in about 4 weeks (around 04/26/2022) for weight loss and appetite.     ChaWilfred LacyP

## 2022-03-30 ENCOUNTER — Telehealth: Payer: Self-pay | Admitting: Nurse Practitioner

## 2022-03-30 DIAGNOSIS — D631 Anemia in chronic kidney disease: Secondary | ICD-10-CM | POA: Diagnosis not present

## 2022-03-30 DIAGNOSIS — Z86718 Personal history of other venous thrombosis and embolism: Secondary | ICD-10-CM | POA: Diagnosis not present

## 2022-03-30 DIAGNOSIS — F330 Major depressive disorder, recurrent, mild: Secondary | ICD-10-CM | POA: Diagnosis not present

## 2022-03-30 DIAGNOSIS — I739 Peripheral vascular disease, unspecified: Secondary | ICD-10-CM | POA: Diagnosis not present

## 2022-03-30 DIAGNOSIS — Z7951 Long term (current) use of inhaled steroids: Secondary | ICD-10-CM | POA: Diagnosis not present

## 2022-03-30 DIAGNOSIS — M150 Primary generalized (osteo)arthritis: Secondary | ICD-10-CM | POA: Diagnosis not present

## 2022-03-30 DIAGNOSIS — E7800 Pure hypercholesterolemia, unspecified: Secondary | ICD-10-CM | POA: Diagnosis not present

## 2022-03-30 DIAGNOSIS — I5042 Chronic combined systolic (congestive) and diastolic (congestive) heart failure: Secondary | ICD-10-CM | POA: Diagnosis not present

## 2022-03-30 DIAGNOSIS — I4819 Other persistent atrial fibrillation: Secondary | ICD-10-CM | POA: Diagnosis not present

## 2022-03-30 DIAGNOSIS — K2210 Ulcer of esophagus without bleeding: Secondary | ICD-10-CM | POA: Diagnosis not present

## 2022-03-30 DIAGNOSIS — Z9181 History of falling: Secondary | ICD-10-CM | POA: Diagnosis not present

## 2022-03-30 DIAGNOSIS — Z7901 Long term (current) use of anticoagulants: Secondary | ICD-10-CM | POA: Diagnosis not present

## 2022-03-30 DIAGNOSIS — Z7952 Long term (current) use of systemic steroids: Secondary | ICD-10-CM | POA: Diagnosis not present

## 2022-03-30 DIAGNOSIS — E46 Unspecified protein-calorie malnutrition: Secondary | ICD-10-CM | POA: Diagnosis not present

## 2022-03-30 DIAGNOSIS — D849 Immunodeficiency, unspecified: Secondary | ICD-10-CM | POA: Diagnosis not present

## 2022-03-30 DIAGNOSIS — I428 Other cardiomyopathies: Secondary | ICD-10-CM | POA: Diagnosis not present

## 2022-03-30 DIAGNOSIS — I69351 Hemiplegia and hemiparesis following cerebral infarction affecting right dominant side: Secondary | ICD-10-CM | POA: Diagnosis not present

## 2022-03-30 DIAGNOSIS — J45909 Unspecified asthma, uncomplicated: Secondary | ICD-10-CM | POA: Diagnosis not present

## 2022-03-30 DIAGNOSIS — H9190 Unspecified hearing loss, unspecified ear: Secondary | ICD-10-CM | POA: Diagnosis not present

## 2022-03-30 DIAGNOSIS — N185 Chronic kidney disease, stage 5: Secondary | ICD-10-CM | POA: Diagnosis not present

## 2022-03-30 DIAGNOSIS — G4733 Obstructive sleep apnea (adult) (pediatric): Secondary | ICD-10-CM | POA: Diagnosis not present

## 2022-03-30 DIAGNOSIS — I132 Hypertensive heart and chronic kidney disease with heart failure and with stage 5 chronic kidney disease, or end stage renal disease: Secondary | ICD-10-CM | POA: Diagnosis not present

## 2022-03-30 DIAGNOSIS — F419 Anxiety disorder, unspecified: Secondary | ICD-10-CM | POA: Diagnosis not present

## 2022-03-30 DIAGNOSIS — N2581 Secondary hyperparathyroidism of renal origin: Secondary | ICD-10-CM | POA: Diagnosis not present

## 2022-03-30 DIAGNOSIS — M1030 Gout due to renal impairment, unspecified site: Secondary | ICD-10-CM | POA: Diagnosis not present

## 2022-03-30 NOTE — Telephone Encounter (Signed)
I will like to repeat CT ABD/pelvis to re eval hematoma of Abdomen. This is to ensure it is stable especially with long term use of aspirin. Are you ok with this order?

## 2022-03-30 NOTE — Assessment & Plan Note (Signed)
Stable mood with fluoxetine Under the care of Dr. Casimiro Needle

## 2022-03-30 NOTE — Assessment & Plan Note (Addendum)
Possibly secondary to depression and multiple chronic conditions: ESRD, CHF  She reports persistent poor appetite and early satiety. She acknowledges lack of energy to complete ADLs and easily gets tired with minimal exertion. She has had frequent falls within the last 34month due to poor gait and generalized weakness. Her husband states she will ask for food or is offered different meals/snacks, but losses interest after a few bites. She does not drink boost BID as recommended. She denies any constipation or nausea or ABD pain.  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   CT ABD 01/2022: diffuse anasarca, Large hematoma within the superficial soft tissues along the anterior aspect of the right hip and right iliac region, increased in size when compared to the prior study. Interval development of an additional large hematoma along the anterior aspect of the symphysis pubis and left hip. Interval decrease in size of the hematoma seen within the superficial soft tissues of the anterolateral aspect of the proximal right thigh on the prior exam. Moderate to marked severity cardiomegaly. Colonic diverticulosis. stable bilateral hemorrhagic and/or proteinaceous renal cysts. MRI correlation is recommended, as an underlying neoplastic process cannot be excluded. Chronic compression fracture deformities at the levels of L1 and L2 with multilevel degenerative changes throughout the lumbar spine. Right hip replacement with streak artifact and limited evaluation of the adjacent osseous and soft tissue structures.  Albumin: varies from 2.1 to 3.6 in the last 854monthWt Readings from Last 3 Encounters:  03/29/22 112 lb (50.8 kg)  03/13/22 108 lb 2 oz (49 kg)  03/06/22 108 lb (49 kg)      Latest Ref Rng & Units 03/01/2022   12:41 PM 01/23/2022    4:10 PM 01/20/2022    7:42 AM  BMP  Glucose 70 - 99 mg/dL 123  96  96   BUN 6 - 23 mg/dL 40  44  43   Creatinine  0.40 - 1.20 mg/dL 2.97  2.94  3.03   Sodium 135 - 145 mEq/L 140  138  137   Potassium 3.5 - 5.1 mEq/L 3.2  3.8  3.7   Chloride 96 - 112 mEq/L 102  104  104   CO2 19 - 32 mEq/L '26  25  24   '$ Calcium 8.4 - 10.5 mg/dL 9.1  9.2  8.8    Encourage Ms. LuBrunoo eat small frequent meals and drink 2bottles of boost per day. F/up in 70m72monthheck prealbumin)

## 2022-04-02 ENCOUNTER — Ambulatory Visit
Admission: RE | Admit: 2022-04-02 | Discharge: 2022-04-02 | Disposition: A | Discharge status: Home / Self Care | Payer: Medicare Other | Source: Ambulatory Visit | Attending: Physical Medicine and Rehabilitation | Admitting: Physical Medicine and Rehabilitation

## 2022-04-02 DIAGNOSIS — M1030 Gout due to renal impairment, unspecified site: Secondary | ICD-10-CM | POA: Diagnosis not present

## 2022-04-02 DIAGNOSIS — M545 Low back pain, unspecified: Secondary | ICD-10-CM | POA: Diagnosis not present

## 2022-04-02 DIAGNOSIS — N185 Chronic kidney disease, stage 5: Secondary | ICD-10-CM | POA: Diagnosis not present

## 2022-04-02 DIAGNOSIS — I132 Hypertensive heart and chronic kidney disease with heart failure and with stage 5 chronic kidney disease, or end stage renal disease: Secondary | ICD-10-CM | POA: Diagnosis not present

## 2022-04-02 DIAGNOSIS — N2581 Secondary hyperparathyroidism of renal origin: Secondary | ICD-10-CM | POA: Diagnosis not present

## 2022-04-02 DIAGNOSIS — I5042 Chronic combined systolic (congestive) and diastolic (congestive) heart failure: Secondary | ICD-10-CM | POA: Diagnosis not present

## 2022-04-02 DIAGNOSIS — D631 Anemia in chronic kidney disease: Secondary | ICD-10-CM | POA: Diagnosis not present

## 2022-04-02 NOTE — Assessment & Plan Note (Addendum)
Ct lumbar spine on 04/02/2022: requested for radiologist to comment of hematoma and renal cyst?

## 2022-04-03 DIAGNOSIS — I132 Hypertensive heart and chronic kidney disease with heart failure and with stage 5 chronic kidney disease, or end stage renal disease: Secondary | ICD-10-CM | POA: Diagnosis not present

## 2022-04-03 DIAGNOSIS — M1030 Gout due to renal impairment, unspecified site: Secondary | ICD-10-CM | POA: Diagnosis not present

## 2022-04-03 DIAGNOSIS — D631 Anemia in chronic kidney disease: Secondary | ICD-10-CM | POA: Diagnosis not present

## 2022-04-03 DIAGNOSIS — N2581 Secondary hyperparathyroidism of renal origin: Secondary | ICD-10-CM | POA: Diagnosis not present

## 2022-04-03 DIAGNOSIS — N185 Chronic kidney disease, stage 5: Secondary | ICD-10-CM | POA: Diagnosis not present

## 2022-04-03 DIAGNOSIS — I5042 Chronic combined systolic (congestive) and diastolic (congestive) heart failure: Secondary | ICD-10-CM | POA: Diagnosis not present

## 2022-04-04 ENCOUNTER — Encounter (HOSPITAL_COMMUNITY)
Admission: RE | Admit: 2022-04-04 | Discharge: 2022-04-04 | Disposition: A | Discharge status: Home / Self Care | Payer: Medicare Other | Source: Ambulatory Visit | Attending: Nephrology | Admitting: Nephrology

## 2022-04-04 VITALS — BP 152/82 | HR 66 | Temp 98.0°F | Resp 17

## 2022-04-04 DIAGNOSIS — K922 Gastrointestinal hemorrhage, unspecified: Secondary | ICD-10-CM | POA: Diagnosis not present

## 2022-04-04 DIAGNOSIS — N281 Cyst of kidney, acquired: Secondary | ICD-10-CM | POA: Diagnosis not present

## 2022-04-04 DIAGNOSIS — R413 Other amnesia: Secondary | ICD-10-CM | POA: Diagnosis not present

## 2022-04-04 DIAGNOSIS — Z940 Kidney transplant status: Secondary | ICD-10-CM | POA: Diagnosis not present

## 2022-04-04 DIAGNOSIS — N184 Chronic kidney disease, stage 4 (severe): Secondary | ICD-10-CM | POA: Diagnosis not present

## 2022-04-04 DIAGNOSIS — N2581 Secondary hyperparathyroidism of renal origin: Secondary | ICD-10-CM | POA: Diagnosis not present

## 2022-04-04 DIAGNOSIS — D849 Immunodeficiency, unspecified: Secondary | ICD-10-CM | POA: Diagnosis not present

## 2022-04-04 DIAGNOSIS — N185 Chronic kidney disease, stage 5: Secondary | ICD-10-CM | POA: Diagnosis not present

## 2022-04-04 DIAGNOSIS — I129 Hypertensive chronic kidney disease with stage 1 through stage 4 chronic kidney disease, or unspecified chronic kidney disease: Secondary | ICD-10-CM | POA: Diagnosis not present

## 2022-04-04 DIAGNOSIS — R809 Proteinuria, unspecified: Secondary | ICD-10-CM | POA: Diagnosis not present

## 2022-04-04 DIAGNOSIS — E872 Acidosis, unspecified: Secondary | ICD-10-CM | POA: Diagnosis not present

## 2022-04-04 DIAGNOSIS — D631 Anemia in chronic kidney disease: Secondary | ICD-10-CM | POA: Diagnosis not present

## 2022-04-04 LAB — FERRITIN: Ferritin: 309 ng/mL — ABNORMAL HIGH (ref 11–307)

## 2022-04-04 LAB — IRON AND TIBC
Iron: 44 ug/dL (ref 28–170)
Saturation Ratios: 22 % (ref 10.4–31.8)
TIBC: 203 ug/dL — ABNORMAL LOW (ref 250–450)
UIBC: 159 ug/dL

## 2022-04-04 LAB — POCT HEMOGLOBIN-HEMACUE: Hemoglobin: 11.2 g/dL — ABNORMAL LOW (ref 12.0–15.0)

## 2022-04-04 MED ORDER — EPOETIN ALFA-EPBX 10000 UNIT/ML IJ SOLN
10000.0000 [IU] | INTRAMUSCULAR | Status: DC
  Administered 2022-04-04: 10000 [IU] via SUBCUTANEOUS

## 2022-04-04 MED ORDER — EPOETIN ALFA-EPBX 10000 UNIT/ML IJ SOLN
INTRAMUSCULAR | Status: AC
  Filled 2022-04-04: qty 1

## 2022-04-04 NOTE — Telephone Encounter (Signed)
FPU:[id number] wants [name] to know her kidney specialist noticed a major decline in her memory from today's 04/04/22  visit. He, [name] feels this needs to be addressed when ever possible.

## 2022-04-05 ENCOUNTER — Ambulatory Visit (INDEPENDENT_AMBULATORY_CARE_PROVIDER_SITE_OTHER): Payer: Medicare Other | Admitting: Psychology

## 2022-04-05 DIAGNOSIS — F331 Major depressive disorder, recurrent, moderate: Secondary | ICD-10-CM | POA: Diagnosis not present

## 2022-04-06 NOTE — Progress Notes (Signed)
[facility] Counselor/Therapist Progress Note  Patient ID: [name], MRN: [id number],    Date:04/05/2022  Time Spent: 60 minutes  Treatment Type: Individual Therapy  Reported Symptoms: depression, decreased motivation  Mental Status Exam: Appearance:  Casual     Behavior: Appropriate  Motor: Using walker today  Speech/Language:  WNL  Affect: Blunt  Mood: depressed  Thought process: normal  Thought content:   WNL  Sensory/Perceptual disturbances:   WNL  Orientation: oriented to person, place, time/date, and situation  Attention: Good  Concentration: Good  Memory: Recent;   [place] of knowledge:  Good  Insight:   Fair  Judgment:  Good  Impulse Control: Good   Risk Assessment: Danger to Self:  No Self-injurious Behavior: No Danger to Others: No Duty to Warn:no Physical Aggression / Violence:No  Access to Firearms a concern: No  Gang Involvement:No   Subjective: The patient attended a face to face individual therapy session.  The patient presents as more depressed today.  The patient states that she feels like she is getting worse.  She reports that she is staying in the bed more and not having the motivation even to get out of the bed.  We talked about the importance of her making herself get up out of the bed and we set a schedule for her to get up at 8 and walk every hour and move to the living room to stay out of the bed.  I talked with her about speaking with her primary care physician to help her possibly with some different medication or some more medication.  We talked about the fact that she is choosing to not get out of bed and using the excuse of I do not feel like it.  The patient also is not eating the way she needs to and I told her that if she is not putting more fuel in her body she is going to cause herself more problems.  I will reach out to her primary care person and see if we can either change her medicines or I can refer her to  another provider for another evaluation.    Interventions: Cognitive Behavioral Therapy and Solution-Oriented/Positive Psychology  Diagnosis:Moderate episode of recurrent major depressive disorder ([place])  Plan: Client Abilities/Strengths  Intelligent, supportive family  Client Treatment Preferences  Outpatient Individual therapy  Client Statement of Needs  "I am depressed because I had a stroke"  Treatment Level  Outpatient Individual therapy  Symptoms  Decrease or loss of appetite.: No Description Entered (Status: maintained). Depressed or irritable  mood.: No Description Entered (Status: maintained). Diminished interest in or enjoyment of activities.:  No Description Entered (Status: maintained). Lack of energy.: No Description Entered (Status:  maintained). Social withdrawal.: No Description Entered (Status: maintained).  Problems Addressed  Unipolar Depression, Unipolar Depression  Goals 1. Alleviate depressive symptoms and return to previous level of effective  functioning. Objective Learn and implement behavioral strategies to overcome depression. Target Date: 2022-10-19 Frequency: Biweekly Progress: 0 Modality: individual  Related Interventions 1. Assist the client in developing skills that increase the likelihood of deriving pleasure from  behavioral activation (e.g., assertiveness skills, developing an exercise plan, less internal/more  external focus, increased social involvement); reinforce success. Objective Identify and replace thoughts and beliefs that support depression. Target Date: 2022-10-19 Frequency: Biweekly Progress: 0 Modality: individual Related Interventions 1. Conduct Cognitive-Behavioral Therapy (see Cognitive Behavior Therapy by Olevia Bowens; Overcoming Depression by Lynita Lombard al.), beginning with helping the client  learn the connection among  cognition, depressive feelings, and actions. 2. Develop healthy thinking patterns and beliefs about self, others,  and the world that lead to the alleviation and help prevent the relapse of  depression. Diagnosis 296.22 (Major depressive affective disorder, single episode, moderate) - Open -  [Signifier: n/a]  Conditions For Discharge Achievement of treatment goals and objectives   Eternity Dexter G. Garold Sheeler, LCSW

## 2022-04-06 NOTE — Addendum Note (Signed)
Addended by: Leana Gamer on: 04/06/2022 07:42 PM   Modules accepted: Orders

## 2022-04-09 DIAGNOSIS — N185 Chronic kidney disease, stage 5: Secondary | ICD-10-CM | POA: Diagnosis not present

## 2022-04-09 DIAGNOSIS — D631 Anemia in chronic kidney disease: Secondary | ICD-10-CM | POA: Diagnosis not present

## 2022-04-09 DIAGNOSIS — I5042 Chronic combined systolic (congestive) and diastolic (congestive) heart failure: Secondary | ICD-10-CM | POA: Diagnosis not present

## 2022-04-09 DIAGNOSIS — M1030 Gout due to renal impairment, unspecified site: Secondary | ICD-10-CM | POA: Diagnosis not present

## 2022-04-09 DIAGNOSIS — M4120 Other idiopathic scoliosis, site unspecified: Secondary | ICD-10-CM | POA: Diagnosis not present

## 2022-04-09 DIAGNOSIS — I132 Hypertensive heart and chronic kidney disease with heart failure and with stage 5 chronic kidney disease, or end stage renal disease: Secondary | ICD-10-CM | POA: Diagnosis not present

## 2022-04-09 DIAGNOSIS — N2581 Secondary hyperparathyroidism of renal origin: Secondary | ICD-10-CM | POA: Diagnosis not present

## 2022-04-09 DIAGNOSIS — M5136 Other intervertebral disc degeneration, lumbar region: Secondary | ICD-10-CM | POA: Diagnosis not present

## 2022-04-10 ENCOUNTER — Telehealth (HOSPITAL_COMMUNITY): Payer: Self-pay

## 2022-04-10 DIAGNOSIS — N185 Chronic kidney disease, stage 5: Secondary | ICD-10-CM | POA: Diagnosis not present

## 2022-04-10 DIAGNOSIS — I132 Hypertensive heart and chronic kidney disease with heart failure and with stage 5 chronic kidney disease, or end stage renal disease: Secondary | ICD-10-CM | POA: Diagnosis not present

## 2022-04-10 DIAGNOSIS — M1030 Gout due to renal impairment, unspecified site: Secondary | ICD-10-CM | POA: Diagnosis not present

## 2022-04-10 DIAGNOSIS — D631 Anemia in chronic kidney disease: Secondary | ICD-10-CM | POA: Diagnosis not present

## 2022-04-10 DIAGNOSIS — I5042 Chronic combined systolic (congestive) and diastolic (congestive) heart failure: Secondary | ICD-10-CM | POA: Diagnosis not present

## 2022-04-10 DIAGNOSIS — N2581 Secondary hyperparathyroidism of renal origin: Secondary | ICD-10-CM | POA: Diagnosis not present

## 2022-04-10 NOTE — Telephone Encounter (Signed)
Called to schedule consult, no answer, left vm. AW  

## 2022-04-10 NOTE — Telephone Encounter (Signed)
Ok per Dr. Estanislado Pandy for bilateral sacroplasty. AW

## 2022-04-12 ENCOUNTER — Other Ambulatory Visit (HOSPITAL_COMMUNITY): Payer: Self-pay | Admitting: Psychiatry

## 2022-04-12 ENCOUNTER — Telehealth: Payer: Self-pay | Admitting: Nurse Practitioner

## 2022-04-12 DIAGNOSIS — F331 Major depressive disorder, recurrent, moderate: Secondary | ICD-10-CM

## 2022-04-12 NOTE — Telephone Encounter (Signed)
[name] called [phone] )and would like for you Make sure you got orthopedics results that [name] just didi

## 2022-04-12 NOTE — Telephone Encounter (Signed)
Called pt & phone went to VM. Will try again later

## 2022-04-13 NOTE — Telephone Encounter (Signed)
2nd attempt to reach out to pt, we have not received results yet.

## 2022-04-16 NOTE — Telephone Encounter (Signed)
Called & spoke w/ pt, says he dropped off results from ms lucus's Ct scan from [place] [facility] [place]. Explained to pt that we haven't received anything, pt says he is on his way up here to drop the paper off again.

## 2022-04-16 NOTE — Telephone Encounter (Signed)
Caller Name: Shamecka Hocutt Call back phone #: [phone]  Reason for Call: Wanting a call back to speak about recent neuro exam. He said there was another recommended scan for her abdomen he has questions about. Also talk about an operation for back, 4th and 5th vertebrae fracture. I confirmed phone # with pt and let him know that cma called twice last week. He said that he received no messages. Mentioned that if he receives no call today will be looking for new provider.

## 2022-04-17 DIAGNOSIS — I132 Hypertensive heart and chronic kidney disease with heart failure and with stage 5 chronic kidney disease, or end stage renal disease: Secondary | ICD-10-CM | POA: Diagnosis not present

## 2022-04-17 DIAGNOSIS — M1030 Gout due to renal impairment, unspecified site: Secondary | ICD-10-CM | POA: Diagnosis not present

## 2022-04-17 DIAGNOSIS — I5042 Chronic combined systolic (congestive) and diastolic (congestive) heart failure: Secondary | ICD-10-CM | POA: Diagnosis not present

## 2022-04-17 DIAGNOSIS — N185 Chronic kidney disease, stage 5: Secondary | ICD-10-CM | POA: Diagnosis not present

## 2022-04-17 DIAGNOSIS — D631 Anemia in chronic kidney disease: Secondary | ICD-10-CM | POA: Diagnosis not present

## 2022-04-17 DIAGNOSIS — N2581 Secondary hyperparathyroidism of renal origin: Secondary | ICD-10-CM | POA: Diagnosis not present

## 2022-04-18 ENCOUNTER — Ambulatory Visit (HOSPITAL_BASED_OUTPATIENT_CLINIC_OR_DEPARTMENT_OTHER): Payer: Medicare Other | Admitting: Psychiatry

## 2022-04-18 ENCOUNTER — Other Ambulatory Visit: Payer: Self-pay | Admitting: Nurse Practitioner

## 2022-04-18 ENCOUNTER — Encounter (HOSPITAL_COMMUNITY)
Admission: RE | Admit: 2022-04-18 | Discharge: 2022-04-18 | Disposition: A | Discharge status: Home / Self Care | Payer: Medicare Other | Source: Ambulatory Visit | Attending: Nephrology | Admitting: Nephrology

## 2022-04-18 ENCOUNTER — Encounter: Payer: Self-pay | Admitting: Family Medicine

## 2022-04-18 ENCOUNTER — Telehealth (HOSPITAL_COMMUNITY): Payer: Self-pay | Admitting: *Deleted

## 2022-04-18 ENCOUNTER — Ambulatory Visit (INDEPENDENT_AMBULATORY_CARE_PROVIDER_SITE_OTHER): Payer: Medicare Other | Admitting: Family Medicine

## 2022-04-18 VITALS — BP 162/80 | HR 71 | Temp 97.8°F | Ht 65.0 in | Wt 112.0 lb

## 2022-04-18 VITALS — BP 162/83 | HR 72 | Temp 96.9°F

## 2022-04-18 VITALS — BP 181/88 | HR 69 | Temp 97.8°F | Wt 110.0 lb

## 2022-04-18 DIAGNOSIS — I132 Hypertensive heart and chronic kidney disease with heart failure and with stage 5 chronic kidney disease, or end stage renal disease: Secondary | ICD-10-CM | POA: Diagnosis not present

## 2022-04-18 DIAGNOSIS — Z940 Kidney transplant status: Secondary | ICD-10-CM | POA: Diagnosis not present

## 2022-04-18 DIAGNOSIS — Z23 Encounter for immunization: Secondary | ICD-10-CM | POA: Diagnosis not present

## 2022-04-18 DIAGNOSIS — I10 Essential (primary) hypertension: Secondary | ICD-10-CM

## 2022-04-18 DIAGNOSIS — S301XXS Contusion of abdominal wall, sequela: Secondary | ICD-10-CM

## 2022-04-18 DIAGNOSIS — I639 Cerebral infarction, unspecified: Secondary | ICD-10-CM

## 2022-04-18 DIAGNOSIS — N185 Chronic kidney disease, stage 5: Secondary | ICD-10-CM

## 2022-04-18 DIAGNOSIS — M7989 Other specified soft tissue disorders: Secondary | ICD-10-CM | POA: Diagnosis not present

## 2022-04-18 DIAGNOSIS — F4323 Adjustment disorder with mixed anxiety and depressed mood: Secondary | ICD-10-CM | POA: Diagnosis not present

## 2022-04-18 DIAGNOSIS — F331 Major depressive disorder, recurrent, moderate: Secondary | ICD-10-CM

## 2022-04-18 DIAGNOSIS — Z86718 Personal history of other venous thrombosis and embolism: Secondary | ICD-10-CM

## 2022-04-18 DIAGNOSIS — I5042 Chronic combined systolic (congestive) and diastolic (congestive) heart failure: Secondary | ICD-10-CM | POA: Diagnosis not present

## 2022-04-18 DIAGNOSIS — R935 Abnormal findings on diagnostic imaging of other abdominal regions, including retroperitoneum: Secondary | ICD-10-CM

## 2022-04-18 DIAGNOSIS — D631 Anemia in chronic kidney disease: Secondary | ICD-10-CM | POA: Diagnosis not present

## 2022-04-18 DIAGNOSIS — M1030 Gout due to renal impairment, unspecified site: Secondary | ICD-10-CM | POA: Diagnosis not present

## 2022-04-18 DIAGNOSIS — N2581 Secondary hyperparathyroidism of renal origin: Secondary | ICD-10-CM | POA: Diagnosis not present

## 2022-04-18 MED ORDER — EPOETIN ALFA-EPBX 10000 UNIT/ML IJ SOLN
INTRAMUSCULAR | Status: AC
  Filled 2022-04-18: qty 1

## 2022-04-18 MED ORDER — FLUOXETINE HCL 20 MG PO CAPS: ORAL_CAPSULE | ORAL | 2 refills | Status: AC

## 2022-04-18 MED ORDER — EPOETIN ALFA-EPBX 10000 UNIT/ML IJ SOLN: 10000.0000 [IU] | INTRAMUSCULAR | Status: DC

## 2022-04-18 NOTE — Progress Notes (Signed)
[facility] [address] [place] [address] Dept: [phone] Dept Fax: [phone]  Office Visit  Subjective:    Patient ID: [name], female    DOB: 1945/12/02, 76 y.o..   MRN: [id number]  Chief Complaint  Patient presents with   Acute Visit    C/o having RT leg swelling/pain after falling 3 weeks ago.      History of Present Illness:  Patient is in today for for evaluation of right leg swelling. [name] had had multiple falls int he past several months. She apparently had a fall 3 weeks ago. Soon afterwards,s he developed acute worsening of swelling to the right lower leg. she does not necessarily complain of significant increased pain with this. [name] is medically fragile and debilitated. 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 She has [place] had issue with significant osteoporosis, in part due to chronic steroid use related to her transplant. She has a history of a prior DVT and pulmonary embolus and did have a IVC filter at some point. She has some chronic edema of her lower legs, thoguh this curren tissue is worse than her usual.  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   Immunocompromised patient ([place]) 08/13/2021   Chronic atrial fibrillation (HCC)    Right leg weakness 07/23/2021   Erosive esophagitis/esophageal ulceration/duodenal ulceration 07/23/2021    Edema of right lower leg 07/23/2021   Acute GI bleeding 07/08/2021   Azotemia 07/08/2021   Pressure injury of skin 07/08/2021   Asthma, chronic, unspecified asthma severity, with acute exacerbation 06/15/2021   Acute on chronic combined systolic and diastolic CHF (congestive heart failure) ([place]) 06/15/2021   Duodenal ulcer 03/29/2021   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   Gastroesophageal reflux disease 06/29/2020   Iron deficiency anemia 06/29/2020   SOB (shortness of breath) 03/11/2020   Traumatic seroma of lower leg, right, subsequent encounter 01/28/2020   Other secondary pulmonary hypertension ([place]) 12/03/2019   Anticoagulation management encounter 11/04/2019   Pacemaker 04/18/2018   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   Personal history of PE (pulmonary embolism) 86/76/1950   Chronic systolic heart failure ([place]) 02/11/2018   Hyperkalemia 02/03/2018   Lower extremity edema 12/10/2017   Presence of Watchman left atrial appendage closure device 10/04/2017   Dyspnea 07/26/2017   Atrial flutter ([place])    Oligouria    Hypertension    Fractures, stress    Eczema    Depression    Constipation    Arthritis    Exacerbation of asthma 04/01/2017   Closed fracture of right distal radius 10/18/2016   Proteinuria 09/04/2016   History of kidney transplant 2017 07/25/2015   Immunosuppression ([place]) 07/25/2015   Nonischemic dilated cardiomyopathy (HCC)    S/P insertion of IVC (inferior vena caval) filter 04/07/2013   Use of cane as ambulatory aid 04/07/2013   S/P hip replacement 03/25/2012   Personal history of DVT/PE s/p IVC filter 12/08/2011   Obstructive sleep apnea  12/08/2011   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[place]) 10/24/2011   CKD (chronic kidney disease), stage V ([place]) 09/14/2011   Hypercalcemia    Gout 09/10/2011   Acute blood loss anemia secondary to diverticular bleed  09/04/2011   Absence of right hip joint with antibiotic pacer 09/03/2011   Traumatic seroma of thigh  ([place]) 06/11/2011   Clostridium difficile colitis 06/09/2011   Leukocytosis 06/08/2011   Symptomatic anemia 06/06/2011   SINUSITIS- ACUTE-NOS 09/27/2010   Atrial fibrillation s/p AVN ablation/PPM implant, s/p LAAC with Watchman 11/25/2008   Cerebral artery occlusion with cerebral infarction ([place]) 09/28/2008   History of CVA with residual deficit 09/28/2008   Hemiplegia ([place]) 09/21/2008   Psoriasis 10/20/2007   Asthmatic bronchitis without complication 16/04/9603   Past Surgical History:  Procedure Laterality Date   AV FISTULA PLACEMENT  09/28/2011   Procedure: ARTERIOVENOUS (AV) FISTULA CREATION;  Surgeon: Rosetta Posner, MD;  Location: [facility];  Service: Vascular;  Laterality: Left;   BIOPSY  07/10/2021   Procedure: BIOPSY;  Surgeon: Irving Copas., MD;  Location: [facility];  Service: Gastroenterology;;   CARDIAC CATHETERIZATION     CARDIAC VALVE [place] DEFECT   CHOLECYSTECTOMY     1980's   COLONOSCOPY     COLONOSCOPY WITH PROPOFOL N/A 11/16/2019   Procedure: COLONOSCOPY WITH PROPOFOL;  Surgeon: Juanita Craver, MD;  [facility];  Service: Endoscopy;  Laterality: N/A;   CYSTOSCOPY     many yrs ago   ESOPHAGOGASTRODUODENOSCOPY N/A 07/10/2021   Procedure: ESOPHAGOGASTRODUODENOSCOPY (EGD);  Surgeon: Irving Copas., MD;  Location: [facility];  Service: Gastroenterology;  Laterality: N/A;   ESOPHAGOGASTRODUODENOSCOPY (EGD) WITH PROPOFOL N/A 11/16/2019   Procedure: ESOPHAGOGASTRODUODENOSCOPY (EGD) WITH PROPOFOL;  Surgeon: Juanita Craver, MD;  Location: [facility] ENDOSCOPY;  Service: Endoscopy;  Laterality: N/A;   ESOPHAGOGASTRODUODENOSCOPY (EGD) WITH PROPOFOL N/A 03/20/2021   Procedure: ESOPHAGOGASTRODUODENOSCOPY (EGD) WITH PROPOFOL;  Surgeon: Milus Banister, MD;  Location: [facility] ENDOSCOPY;  Service: Endoscopy;  Laterality: N/A;   FEMUR FRACTURE SURGERY Right 03/2011; 09/03/2011   "had 2, 2 wk apart in 2012; broke it again 08/2011 & had OR"   [place] N/A 07/10/2021   Procedure: FLEXIBLE SIGMOIDOSCOPY;  Surgeon: Rush Landmark Telford Nab., MD;  Location: [facility];  Service: Gastroenterology;  Laterality: N/A;   FRACTURE SURGERY     GIVENS CAPSULE STUDY N/A 07/11/2021   Procedure: GIVENS CAPSULE STUDY;  Surgeon: Juanita Craver, MD;  Location: [facility] ENDOSCOPY;  Service: Endoscopy;  Laterality: N/A;   HEMOSTASIS CONTROL  03/20/2021   Procedure: HEMOSTASIS CONTROL;  Surgeon: Milus Banister, MD;  Location: [facility];  Service: Endoscopy;;   HOT HEMOSTASIS N/A 03/20/2021   Procedure: HOT HEMOSTASIS (ARGON PLASMA COAGULATION/BICAP);  Surgeon: Milus Banister, MD;  Location: [facility] ENDOSCOPY;  Service: Endoscopy;  Laterality: N/A;   HOT HEMOSTASIS N/A 07/10/2021   Procedure: HOT HEMOSTASIS (ARGON PLASMA COAGULATION/BICAP);  Surgeon: Irving Copas., MD;  Location: [facility];  Service: Gastroenterology;  Laterality: N/A;   I & D EXTREMITY  09/15/2011   Procedure: IRRIGATION AND DEBRIDEMENT EXTREMITY w REMOVAL OF HARDWARE;  Surgeon: [name], MD;  Location: [facility];  Service: Orthopedics;  Laterality: Right;   INCISION AND DRAINAGE HIP  12/21/2011   Procedure: IRRIGATION AND DEBRIDEMENT HIP;  Surgeon: [name], MD;  Location: [facility];  Service: Orthopedics;  Laterality: Right;  I&D RIGHT HIP WITH PLACEMENT ANTIBIOTIC SPACER   INSERTION OF DIALYSIS CATHETER     Procedure:  INSERTION OF DIALYSIS CATHETER;  Surgeon: Rosetta Posner, MD;  Location: [facility];  Service: Vascular;  Laterality: Right;   IRRIGATION AND DEBRIDEMENT ABSCESS  12/21/11   right hip   JOINT REPLACEMENT     KIDNEY TRANSPLANT  07/19/15   LAPAROSCOPIC GASTRIC BANDING  2008   LEFT ATRIAL APPENDAGE OCCLUSION  10/04/2017   OPEN REDUCTION INTERNAL FIXATION (ORIF) DISTAL RADIAL FRACTURE Right 10/18/2016   Procedure: OPEN REDUCTION INTERNAL FIXATION (ORIF) RIGHT DISTAL RADIAL FRACTURE;  Surgeon: Marchia Bond, MD;  Location: [facility];  Service: Orthopedics;   Laterality: Right;   SUBMUCOSAL TATTOO INJECTION  07/10/2021   Procedure: SUBMUCOSAL TATTOO INJECTION;  Surgeon: Irving Copas., MD;  Location: [facility];  Service: Gastroenterology;;   TOTAL HIP ARTHROPLASTY Right 02/2011   right THA 02/2011, I&D/removal of hardware 09/2011,, repeat I&D Jun 2013, reimplantation R THA 03-26-2012   TOTAL HIP REVISION  03/25/2012   Procedure: TOTAL HIP REVISION;  Surgeon: [name], MD;  Location: [facility];  Service: Orthopedics;  Laterality: Right;  Right Total Hip Reimplantation   TUBAL LIGATION     VENA CAVA FILTER PLACEMENT  11/2011   Family History  Problem Relation Age of Onset   Cancer Father    Diabetes Mother    Hypertension Mother    Arthritis Mother    Hodgkin's lymphoma Other 68       dscd---HODGKINS DISEASE   Hypertension Brother    Outpatient Medications Prior to Visit  Medication Sig Dispense Refill   acetaminophen (TYLENOL) 500 MG tablet Take 500 mg by mouth every 6 (six) hours as needed for mild pain or headache.      albuterol (PROVENTIL) (2.5 MG/3ML) 0.083% nebulizer solution Take 3 mLs (2.5 mg total) by nebulization every 6 (six) hours as needed for wheezing or shortness of breath. Dx:J45.909 150 mL 2   aspirin EC 81 MG tablet Take 81 mg by mouth daily. Swallow whole.     atorvastatin (LIPITOR) 20 MG tablet Take 20 mg by mouth daily.     budesonide-formoterol (SYMBICORT) 160-4.5 MCG/ACT inhaler Inhale 2 puffs into the lungs 2 (two) times daily. 1 each 5   carvedilol (COREG) 3.125 MG tablet Take 1 tablet (3.125 mg total) by mouth 2 (two) times daily with a meal. 180 tablet 3   docusate sodium (COLACE) 100 MG capsule Take 100 mg by mouth daily.     FLUoxetine (PROZAC) 20 MG capsule 2 qam 60 capsule 2   furosemide (LASIX) 80 MG tablet Take 80 mg by mouth 3 (three) times a week.     HYDROcodone-acetaminophen (NORCO/VICODIN) 5-325 MG tablet Take 1 tablet by mouth every 6 (six) hours as needed. 12 tablet 0   lactulose (CHRONULAC)  10 GM/15ML solution Take 30 mLs (20 g total) by mouth daily as needed for mild constipation or severe constipation. 236 mL 0   mycophenolate (MYFORTIC) 180 MG EC tablet Take 180 mg by mouth 2 (two) times daily.     ondansetron (ZOFRAN) 4 MG tablet Take 1 tablet (4 mg total) by mouth every 8 (eight) hours as needed for up to 9 doses for nausea. 9 tablet 0   oxyCODONE (OXY IR/ROXICODONE) 5 MG immediate release tablet Take 1 tablet (5 mg total) by mouth every 6 (six) hours as needed for severe pain. 10 tablet 0   pantoprazole (PROTONIX) 20 MG tablet TAKE 1 TABLET BY MOUTH EVERY DAY 90 tablet 0   polyethylene glycol (MIRALAX / GLYCOLAX) 17 g packet Take  17 g by mouth daily. Hold if diarrhea 14 each 0   potassium chloride (MICRO-K) 10 MEQ CR capsule Take 10 mEq by mouth 3 (three) times a week.     predniSONE (DELTASONE) 5 MG tablet Take 5 mg by mouth daily.     tacrolimus (PROGRAF) 1 MG capsule Take 1 mg by mouth 2 (two) times daily.     Facility-Administered Medications Prior to Visit  Medication Dose Route Frequency Provider Last Rate Last Admin   epoetin alfa-epbx (RETACRIT) injection 10,000 Units  10,000 Units Subcutaneous Q14 Days Claudia Desanctis, MD       Allergies  Allergen Reactions   Warfarin Sodium Other (See Comments)    Caused her to have a stroke/left side of brain to hemorrhage    Ace Inhibitors Cough   Amiodarone Other (See Comments)    Pt with Restrictive Lung Disease by 10/2017 PFT with decreased DLCO   Amlodipine Swelling    Swelling in feet      Objective:   Today's Vitals   04/18/22 1548  BP: (!) 162/80  Pulse: 71  Temp: 97.8 F (36.6 C)  TempSrc: Temporal  SpO2: 98%  Weight: 112 lb (50.8 kg)  Height: '5\' 5"'$  (1.651 m)   Body mass index is 18.64 kg/m.   General: Well developed, well nourished. No acute distress. Extremities: The right leg has 3+ puffy edema up tot he knee. There is no sign of trauma. It is   not necessarily tender. There is only trace edema on  the left.  Psych: Alert and oriented. Normal mood and affect.  Health Maintenance Due  Topic Date Due   Zoster Vaccines- Shingrix (1 of 2) Never done   INFLUENZA VACCINE  02/13/2022   MAMMOGRAM  03/08/2022   Imaging: CT of Abdomen and Pelvis wo contrast (01/19/2022) IMPRESSION: 1. Large hematoma within the superficial soft tissues along the anterior aspect of the right hip and right iliac region, increased in size when compared to the prior study. 2. Interval development of an additional large hematoma along the anterior aspect of the symphysis pubis and left hip. 3. Interval decrease in size of the hematoma seen within the superficial soft tissues of the anterolateral aspect of the proximal right thigh on the prior exam. 4. Moderate to marked severity cardiomegaly. 5. Colonic diverticulosis. 6. Stable bilateral hemorrhagic and/or proteinaceous renal cysts. MRI correlation is recommended, as an underlying neoplastic process cannot be excluded. 7. Chronic compression fracture deformities at the levels of L1 and L2 with multilevel degenerative changes throughout the lumbar spine. 8. Right hip replacement with streak artifact and limited evaluation of the adjacent osseous and soft tissue structures.   Aortic Atherosclerosis (ICD10-I70.0).  CT Lumbar Spine wo contrast (04/02/2022) IMPRESSION: Chronic L1 and L2 compression fractures without evidence of progressive height loss and unchanged bony retropulsion. No new lumbar spine fracture.   Probable sacral insufficiency fracture at the level of S2/S3 and involvement of the left S2 neural foramen. Fracture appears to extend through the posterior elements. This could be confirmed with sacrum MRI if clinically indicated.   Multilevel degenerative changes of lumbar spine, with significant stenoses as summarized below:   T12-L1: Mild spinal canal stenosis related to retropulsion of the L1 superior endplate. Mild bilateral neural foraminal  stenosis.   L1-L2: Mild-to-moderate spinal canal stenosis. Moderate-severe left and mild-to-moderate right neural foraminal stenosis.   L2-L3: Mild spinal canal stenosis. Moderate right neural foramina stenosis.   L3-L4: Mild-to-moderate spinal canal stenosis. Moderate right neural foraminal  stenosis.   L4-L5: Moderate spinal canal stenosis. Severe bilateral neural foraminal stenosis.   L5-S1: Moderate-severe right neural foraminal stenosis. Severe left neural foraminal stenosis.  Assessment & Plan:   1. Right leg swelling 2. Personal history of DVT/PE s/p IVC filter The right leg does show asymmetric swelling compared to the left. In light of her prior history of DVT, I recommend we check a d-dimer and obtain a venous doppler ultrasound to assess for a DVT. Her PCP is already engaged in ordering a pelvic ultrasound to further evaluate the possible pelvic fracture. [name] is medically fragile and appears to be a very poor surgical candidate. She is currently in a wheel chair and her husband assists her as he is able with transfers and other care. If a fracture is confirmed, she may need evaluation by orthopedics.  - D-Dimer, Quantitative - US Venous Img Lower Unilateral Right; Future  3. Hypertension, unspecified type 4. History of kidney transplant 2017 5. CKD (chronic kidney disease), stage V [facility]) [name] apparently sees Dr. Harrie Jeans (nephrology) related to her chronic kidney disease. her blood pressure has been elevated over the last several visits. I recommend she follow up with Dr. Royce Macadamia for guidance regarding further blood pressure control.  - Ambulatory referral to Nephrology  6. Need for influenza vaccination  - Flu Vaccine QUAD High Dose(Fluad)   Return for Follow up with Ms. [name] as discussed.   Haydee Salter, MD

## 2022-04-18 NOTE — Progress Notes (Signed)
Psychiatric Initial Adult Assessment   Patient Identification: [name] MRN:  [id number] Date of Evaluation:  04/18/2022 Referral Source: Self-referred Chief Complaint:  No chief complaint on file.  Visit Diagnosis: Major depression   ICD-10-CM   1. Moderate episode of recurrent major depressive disorder (HCC)  F33.1 FLUoxetine (PROZAC) 20 MG capsule      History of Present Illness:     Associated Signs/Symptoms: Depression Symptoms:  hypersomnia, (Hypo) Manic Symptoms:   Anxiety Symptoms:   Psychotic Symptoms:   PTSD Symptoms:  Today the patient is seen with her husband and [name].  Overall patient seems to be about at her baseline.  She still has some more depressed days than good days.  In close evaluation it turns out she never increased her Prozac as instructed.  He is only been taking 20 mg.  Her husband thought she was taking a higher dose but she admits she has not.  He will watch and monitor to make sure she takes 40 mg starting today.  The patient actually is sleeping and eating pretty well.  She says she has gained a few pounds.  She is taking concentrating well.  She got good energy she denies worthlessness.  She denies any psychomotor problems.  She denies being suicidal.  Patient drinks no alcohol and uses no drugs and there are no new stresses.  Patient continues to go reading group treatment for the death of her grandchild from a motor vehicle accident many years ago.  The patient continues to read and watch TV and enjoys when her other grandchildren come to visit.  She still has the Passive need to enjoy she still likes to read and watch TV.  She has minimal evidence of clear major depression.  It was confirmed that she is actually eating I would not consider adding Remeron.  When she returns [place] her mood is better and it is no real difference I would consider possibly removing the Prozac.  We will discuss that in 2-1/2 months when she comes back.   Past  Psychiatric History: Prozac  Previous Psychotropic Medications: Yes   Substance Abuse History in the last 12 months:  No.  Consequences of Substance Abuse: NA  Past Medical History:  Past Medical History:  Diagnosis Date   Acute diastolic (congestive) heart failure (HCC) 08/12/2017   Acute GI bleeding 11/14/2019   Acute kidney injury superimposed on chronic kidney disease ([place]) 11/17/2017   AKI (acute kidney injury) ([place]) 11/04/2017   Anemia    Anxiety    ARF (acute renal failure) ([place]) 06/06/2011   Arthritis    "shoulders; arms; hips" (02/04/2018)   Asthma    Atrial fibrillation (HCC)    Atrial flutter ([place])    s/p aflutter ablation at [facility]   Bacteriuria, asymptomatic 11/14/2020   Benign hypertension with ESRD (end-stage renal disease) ([place])    Blood transfusion    never had a reaction to blood transfusion   Cardiomyopathy Mar 2013   Mild, EF 50-55% by Mar 2013 ECHO, diast dysfxn II   Cardiomyopathy ([place])    CHF (congestive heart failure) ([place]) 2005   CHF (congestive heart failure) ([place])    Childhood asthma    Closed fracture of right distal radius 10/18/2016   CMV (cytomegalovirus infection) ([place]) 10/03/2015   Constipation    takes Miralax daily   Constipation    Depression    takes Zoloft daily   Distal radius fracture, right    DVT of lower extremity, bilateral (  [place]) 12/21/11   "they're there now; been there for 2 wks"   Eczema    End stage renal disease ([place]) 11/06/2011   ESRD (end stage renal disease) on dialysis [facility]) 09/2011   07-19-2015 had Kidney transplant at [facility]; "don't get dialysis anymore" (02/04/2018)   Essential hypertension 07/07/2007   Qualifier: Diagnosis of  By: Garen Grams     Fractures, stress    in both feet--6 OR 7 YRS AGO--RESOLVED   Generalized edema [id number]   GI bleed 11/14/2019   Gout    doesn't require meds    HCAP (healthcare-associated pneumonia) 09/10/2011   Hearing difficulty    Hematoma of left lower leg 05/17/2020   High  cholesterol    History of CVA (cerebrovascular accident) without residual deficits    History of hip replacement, total, right    History of pneumonia    Hx of Clostridium difficile infection    Hypercalcemia    09/10/11   Hyperparathyroidism ([place]) 04/07/2013   Hypertension    takes Diltiazem daily    Hypomagnesemia 08/02/2015   Hypophosphatemia 11/08/2017   ICB (intracranial bleed) ([place])    Immunosuppression ([place]) 07/25/2015   Memory changes    Morbid obesity ([place])    Nonischemic dilated cardiomyopathy ([place])    Obesity 04/07/2013   Oligouria    Oral mucositis 02/22/2018   OSA on CPAP    PAF (paroxysmal atrial fibrillation) (HCC)     HX OF CEREBRAL BLEED WHILE ON COUMADIN-SO PT NOT ON ANY BLOOD THINNERS NOW   Peripheral vascular disease ([place])    Persistent atrial fibrillation ([place]) 08/12/2017   Overview:  Added automatically from request for surgery [id number]  Formatting of this note might be different from the original. Added automatically from request for surgery [id number]   Presence of Watchman left atrial appendage closure device 10/04/2017   Proteinuria 09/04/2016   Psoriasis 08/07/2016   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/25/2015   S/P insertion of IVC (inferior vena caval) filter    Sepsis ([place]) 11/08/2017   Stress fracture    bilateral feet   Stroke ([place]) 2009   denies residual on 02/04/2018;  hemorrhagic now off coumadin   Stroke ([place]) 07/17/2007   denies residual, hemorrhagic now off coumadin   Stroke due to intracerebral hemorrhage ([place]) 2009   Suture reaction 09/04/2016   Transfusion history    Use of cane as ambulatory aid     Past Surgical History:  Procedure Laterality Date   AV FISTULA PLACEMENT  09/28/2011   Procedure: ARTERIOVENOUS (AV) FISTULA CREATION;  Surgeon: Rosetta Posner, MD;  Location: [facility];  Service: Vascular;  Laterality: Left;   BIOPSY  07/10/2021   Procedure: BIOPSY;  Surgeon: Irving Copas., MD;  Location[facility];  Service:  Gastroenterology;;   CARDIAC CATHETERIZATION     CARDIAC VALVE [place] DEFECT   CHOLECYSTECTOMY     1980's   COLONOSCOPY     COLONOSCOPY WITH PROPOFOL N/A 11/16/2019   Procedure: COLONOSCOPY WITH PROPOFOL;  Surgeon: Juanita Craver, MD;  Location: [facility] ENDOSCOPY;  Service: Endoscopy;  Laterality: N/A;   CYSTOSCOPY     many yrs ago   ESOPHAGOGASTRODUODENOSCOPY N/A 07/10/2021   Procedure: ESOPHAGOGASTRODUODENOSCOPY (EGD);  Surgeon: Irving Copas., MD;  Location: [facility];  Service: Gastroenterology;  Laterality: N/A;   ESOPHAGOGASTRODUODENOSCOPY (EGD) WITH PROPOFOL N/A 11/16/2019   Procedure: ESOPHAGOGASTRODUODENOSCOPY (EGD) WITH PROPOFOL;  Surgeon: Juanita Craver, MD;  Location[facility] ENDOSCOPY;  Service: Endoscopy;  Laterality:  N/A;   ESOPHAGOGASTRODUODENOSCOPY (EGD) WITH PROPOFOL N/A 03/20/2021   Procedure: ESOPHAGOGASTRODUODENOSCOPY (EGD) WITH PROPOFOL;  Surgeon: Milus Banister, MD;  Location: [facility] ENDOSCOPY;  Service: Endoscopy;  Laterality: N/A;   FEMUR FRACTURE SURGERY Right 03/2011; 09/03/2011   "had 2, 2 wk apart in 2012; broke it again 08/2011 & had OR"   [place] N/A 07/10/2021   Procedure: FLEXIBLE SIGMOIDOSCOPY;  Surgeon: Rush Landmark Telford Nab., MD;  Location: [facility];  Service: Gastroenterology;  Laterality: N/A;   FRACTURE SURGERY     GIVENS CAPSULE STUDY N/A 07/11/2021   Procedure: GIVENS CAPSULE STUDY;  Surgeon: Juanita Craver, MD;  [facility] ENDOSCOPY;  Service: Endoscopy;  Laterality: N/A;   HEMOSTASIS CONTROL  03/20/2021   Procedure: HEMOSTASIS CONTROL;  Surgeon: Milus Banister, MD;  Location: [facility];  Service: Endoscopy;;   HOT HEMOSTASIS N/A 03/20/2021   Procedure: HOT HEMOSTASIS (ARGON PLASMA COAGULATION/BICAP);  Surgeon: Milus Banister, MD;  Location[facility] ENDOSCOPY;  Service: Endoscopy;  Laterality: N/A;   HOT HEMOSTASIS N/A 07/10/2021   Procedure: HOT HEMOSTASIS (ARGON PLASMA COAGULATION/BICAP);  Surgeon: Irving Copas., MD;  Location: [facility];  Service: Gastroenterology;  Laterality: N/A;   I & D EXTREMITY  09/15/2011   Procedure: IRRIGATION AND DEBRIDEMENT EXTREMITY w REMOVAL OF HARDWARE;  Surgeon: [name], MD;  Location: [place];  Service: Orthopedics;  Laterality: Right;   INCISION AND DRAINAGE HIP  12/21/2011   Procedure: IRRIGATION AND DEBRIDEMENT HIP;  Surgeon: [name], MD;  Location: [facility];  Service: Orthopedics;  Laterality: Right;  I&D RIGHT HIP WITH PLACEMENT ANTIBIOTIC SPACER   INSERTION OF DIALYSIS CATHETER     Procedure: INSERTION OF DIALYSIS CATHETER;  Surgeon: Rosetta Posner, MD;  Location: [place];  Service: Vascular;  Laterality: Right;   IRRIGATION AND DEBRIDEMENT ABSCESS  12/21/11   right hip   JOINT REPLACEMENT     KIDNEY TRANSPLANT  07/19/15   LAPAROSCOPIC GASTRIC BANDING  2008   LEFT ATRIAL APPENDAGE OCCLUSION  10/04/2017   OPEN REDUCTION INTERNAL FIXATION (ORIF) DISTAL RADIAL FRACTURE Right 10/18/2016   Procedure: OPEN REDUCTION INTERNAL FIXATION (ORIF) RIGHT DISTAL RADIAL FRACTURE;  Surgeon: Marchia Bond, MD;  Location: [facility];  Service: Orthopedics;  Laterality: Right;   SUBMUCOSAL TATTOO INJECTION  07/10/2021   Procedure: SUBMUCOSAL TATTOO INJECTION;  Surgeon: Irving Copas., MD;  Location[facility];  Service: Gastroenterology;;   TOTAL HIP ARTHROPLASTY Right 02/2011   right THA 02/2011, I&D/removal of hardware 09/2011,, repeat I&D Jun 2013, reimplantation R THA 03-26-2012   TOTAL HIP REVISION  03/25/2012   Procedure: TOTAL HIP REVISION;  Surgeon: [name], MD;  [facility]: [facility];  Service: Orthopedics;  Laterality: Right;  Right Total Hip Reimplantation   TUBAL LIGATION     VENA CAVA FILTER PLACEMENT  11/2011    Family Psychiatric History:   Family History:  Family History  Problem Relation Age of Onset   Cancer Father    Diabetes Mother    Hypertension Mother    Arthritis Mother    Hodgkin's lymphoma Other 30        dscd---HODGKINS DISEASE   Hypertension Brother     Social History:   Social History   Socioeconomic History   Marital status: Married    Spouse name: Not on file   Number of children: Not on file   Years of education: Not on file   Highest education level: Not on file  Occupational History   Occupation:  retired  Tobacco Use   Smoking status: Never   Smokeless tobacco: Never  Vaping Use   Vaping Use: Never used  Substance and Sexual Activity   Alcohol use: Never   Drug use: Never   Sexual activity: Yes    Birth control/protection: Post-menopausal  Other Topics Concern   Not on file  Social History Narrative   Married; retired; lives in [place] Strain: Angie  (12/05/2021)   Overall Financial Resource Strain ([place])    Difficulty of Paying Living Expenses: Not hard at all  Food Insecurity: No [place] (12/15/2021)   Hunger Vital Sign    Worried About Running Out of Food in the Last Year: Never true    [place] in the Last Year: Never true  Transportation Needs: No Transportation Needs (12/13/2021)   PRAPARE - Hydrologist (Medical): No    Lack of Transportation (Non-Medical): No  Physical Activity: Inactive (12/05/2021)   Exercise Vital Sign    Days of Exercise per Week: 0 days    Minutes of Exercise per Session: 0 min  Stress: No Stress Concern Present (12/05/2021)   [place]    Feeling of Stress : Not at all  Social Connections: [place] (12/05/2021)   Social Connection and Isolation Panel [NHANES]    Frequency of Communication with Friends and Family: Twice a week    Frequency of Social Gatherings with Friends and Family: Twice a week    Attends Religious Services: More than 4 times per year    Active Member of [employer] or Organizations: Yes    Attends Archivist Meetings: 1 to 4  times per year    Marital Status: Married    Additional Social History:   Allergies:   Allergies  Allergen Reactions   Warfarin Sodium Other (See Comments)    Caused her to have a stroke/left side of brain to hemorrhage    Ace Inhibitors Cough   Amiodarone Other (See Comments)    Pt with Restrictive Lung Disease by 10/2017 PFT with decreased DLCO   Amlodipine Swelling    Swelling in feet    Metabolic Disorder Labs: Lab Results  Component Value Date   HGBA1C 4.7 (L) 07/23/2021   MPG 88.19 07/23/2021   MPG 111 11/16/2008   No results found for: "PROLACTIN" Lab Results  Component Value Date   CHOL 124 07/24/2021   TRIG 95 07/24/2021   HDL 34 (L) 07/24/2021   CHOLHDL 3.6 07/24/2021   VLDL 19 07/24/2021   LDLCALC 71 07/24/2021   LDLCALC 69 11/03/2020   Lab Results  Component Value Date   TSH 1.765 07/23/2021    Therapeutic Level Labs: No results found for: "LITHIUM" No results found for: "CBMZ" No results found for: "VALPROATE"  Current Medications: Current Outpatient Medications  Medication Sig Dispense Refill   acetaminophen (TYLENOL) 500 MG tablet Take 500 mg by mouth every 6 (six) hours as needed for mild pain or headache.      albuterol (PROVENTIL) (2.5 MG/3ML) 0.083% nebulizer solution Take 3 mLs (2.5 mg total) by nebulization every 6 (six) hours as needed for wheezing or shortness of breath. Dx:J45.909 150 mL 2   aspirin EC 81 MG tablet Take 81 mg by mouth daily. Swallow whole.     atorvastatin (LIPITOR) 20 MG tablet Take 20 mg by mouth daily.  budesonide-formoterol (SYMBICORT) 160-4.5 MCG/ACT inhaler Inhale 2 puffs into the lungs 2 (two) times daily. 1 each 5   carvedilol (COREG) 3.125 MG tablet Take 1 tablet (3.125 mg total) by mouth 2 (two) times daily with a meal. 180 tablet 3   docusate sodium (COLACE) 100 MG capsule Take 100 mg by mouth daily.     FLUoxetine (PROZAC) 20 MG capsule 2 qam 60 capsule 2   furosemide (LASIX) 80 MG tablet Take 80 mg by  mouth 3 (three) times a week.     HYDROcodone-acetaminophen (NORCO/VICODIN) 5-325 MG tablet Take 1 tablet by mouth every 6 (six) hours as needed. 12 tablet 0   lactulose (CHRONULAC) 10 GM/15ML solution Take 30 mLs (20 g total) by mouth daily as needed for mild constipation or severe constipation. 236 mL 0   mycophenolate (MYFORTIC) 180 MG EC tablet Take 180 mg by mouth 2 (two) times daily.     ondansetron (ZOFRAN) 4 MG tablet Take 1 tablet (4 mg total) by mouth every 8 (eight) hours as needed for up to 9 doses for nausea. 9 tablet 0   oxyCODONE (OXY IR/ROXICODONE) 5 MG immediate release tablet Take 1 tablet (5 mg total) by mouth every 6 (six) hours as needed for severe pain. 10 tablet 0   pantoprazole (PROTONIX) 20 MG tablet TAKE 1 TABLET BY MOUTH EVERY DAY 90 tablet 0   polyethylene glycol (MIRALAX / GLYCOLAX) 17 g packet Take 17 g by mouth daily. Hold if diarrhea 14 each 0   potassium chloride (MICRO-K) 10 MEQ CR capsule Take 10 mEq by mouth 3 (three) times a week.     predniSONE (DELTASONE) 5 MG tablet Take 5 mg by mouth daily.     tacrolimus (PROGRAF) 1 MG capsule Take 1 mg by mouth 2 (two) times daily.     No current facility-administered medications for this visit.   Facility-Administered Medications Ordered in Other Visits  Medication Dose Route Frequency Provider Last Rate Last Admin   epoetin alfa-epbx (RETACRIT) injection 10,000 Units  10,000 Units Subcutaneous Q14 Days Claudia Desanctis, MD        Musculoskeletal: Strength & Muscle Tone: abnormal Gait & Station: On a walker Patient leans: N/A  Psychiatric Specialty Exam: Review of Systems  There were no vitals taken for this visit.There is no height or weight on file to calculate BMI.  General Appearance: Casual  Eye Contact:  Good  Speech:  Clear and Coherent  Volume:  Normal  Mood:  Negative  Affect:  Appropriate  Thought Process:  Coherent  Orientation:  Full (Time, Place, and Person)  Thought Content:  Logical   Suicidal Thoughts:  No  Homicidal Thoughts:  No  Memory:  NA  Judgement:  Good  Insight:  Good  Psychomotor Activity:  Normal  Concentration:    Recall:  Good  Fund of Knowledge:Fair  Language: Good  Akathisia:  No  Handed:  Right  AIMS (if indicated):  not done  Assets:  Desire for Improvement  ADL's:  Intact  Cognition: WNL  Sleep:  Negative   Screenings: GAD-7    Flowsheet Row Office Visit from 11/14/2021 in [facility] Video Visit from 10/10/2021 in [facility] Office Visit from 07/21/2021 in [facility] Visit from 03/29/2021 in [facility] Visit from 11/03/2020 in [facility]  Total GAD-7 Score '5 7 13 6 5      '$ Mini-Mental    Flowsheet Row Clinical  Support from 09/18/2016 in [facility] at [facility]  Total Score (max 30 points ) 29      PHQ2-9    Flowsheet Row Chronic Care Management from 12/13/2021 in [facility] from 12/05/2021 in [facility] Visit from 11/14/2021 in [facility] Patient Outreach Telephone from 10/24/2021 in [facility] Video Visit from 10/10/2021 in [facility] [place] [facility]  PHQ-2 Total Score 2 0 '6 2 3  '$ PHQ-9 Total Score 11 -- '17 13 12      '$ Flowsheet Row Medication Injection 15 from 04/04/2022 in [facility] Medication Injection 15 from 03/07/2022 in [facility] ED to Hosp-Admission (Discharged) from 01/19/2022 in [facility] No Risk No Risk No Risk       Assessment and Plan:   At this time the patient does not clearly meet an episode of major depression.  I am not clear if she ever did.  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  The  only points against that conclusion is the fact that she is sleeping excessively and would be described as having psychomotor slowing.  She does not have anhedonia and she denies persistent daily depression.  Her appetite is reduced.  I think the most important intervention is that she stay in psychotherapy.  Today we will go ahead and increase her Prozac to the full dose of 40 mg.  She will return to see me in 2 months.  If she experiences no benefit we will taper and discontinue Prozac.  The outside chance to consider Remeron to help her appetite should possibly be considered.  I think her depression is meaningful but I do not think he meets the criteria for major depression and therefore would require multiple trials of antidepressants.  The patient is actually functioning pretty well.  Collaboration of Care:    This patient's first problem is most likely adjustment disorder with depressed mood state.  I have clear specified I have no clear evidence that she truly has a major depressive episode.  I suspect this is a process of grieving excessively.  If that he is not sleeping and not eating I will consider adding Remeron but I do not think that is the case.  She had increased the Prozac to the maximum dose of 40 mg and return to see me in 2-1/2 months.  Her husband with her today.  The patient is not suicidal.  Consent: Patient/Guardian gives verbal consent for treatment and assignment of benefits for services provided during this visit. Patient/Guardian expressed understanding and agreed to proceed.   Jerral Ralph, MD 10/4/20231:51 PM

## 2022-04-18 NOTE — Telephone Encounter (Signed)
Patient husband has called stated he went to Rx to pick up wives scripts & they aren't there. Couldn't understand clearly -- increase her Prozac to 40 mg.   &&  Remeron to help her appetite should possibly considered

## 2022-04-19 ENCOUNTER — Telehealth: Payer: Self-pay | Admitting: Adult Health

## 2022-04-19 ENCOUNTER — Telehealth (INDEPENDENT_AMBULATORY_CARE_PROVIDER_SITE_OTHER): Payer: Medicare Other | Admitting: Nurse Practitioner

## 2022-04-19 ENCOUNTER — Encounter: Payer: Self-pay | Admitting: Nurse Practitioner

## 2022-04-19 VITALS — Ht 65.0 in | Wt 112.0 lb

## 2022-04-19 DIAGNOSIS — I6930 Unspecified sequelae of cerebral infarction: Secondary | ICD-10-CM

## 2022-04-19 DIAGNOSIS — M8000XA Age-related osteoporosis with current pathological fracture, unspecified site, initial encounter for fracture: Secondary | ICD-10-CM

## 2022-04-19 DIAGNOSIS — R413 Other amnesia: Secondary | ICD-10-CM | POA: Diagnosis not present

## 2022-04-19 LAB — POCT HEMOGLOBIN-HEMACUE: Hemoglobin: 12.2 g/dL (ref 12.0–15.0)

## 2022-04-19 NOTE — Telephone Encounter (Signed)
[facility]) Discuss residual stroke symptoms for the patient.

## 2022-04-19 NOTE — Assessment & Plan Note (Signed)
Chronic use of glucocorticosteriod Per CT lumbar spine 03/2022: with chronic compression fracture L1-2 We discussed use of bisphosphonate vs prolia injection. Advised to discuss treatment option with endocrinology fur to severe renal impairement. They agreed to referral

## 2022-04-19 NOTE — Assessment & Plan Note (Addendum)
Ongoing since CVA 07/2021, per husband: forgets to take medications, forgets what medication is for, forgets medical history. He wants eval for possible dementia.  Last appt with neurology 02/2022 MMSE today 25/30 Alert and oriented x4 today. Normal speech She has chronic generalized weakness, with no change Limited exam due to televiist appt  Since new referral was declined, I will discuss management plan and necessary f/up  with [facility] provider. Possible repeat MRI brain? Discussed care plan with Frann Rider, NP: since symptoms are not new, recommendation is to continue home ST and possibly advance to outpatient neuro rehab if no improvement.

## 2022-04-19 NOTE — Progress Notes (Signed)
Virtual Visit via Video Note  I connected withNAME@ on 04/20/22 at 10:00 AM EDT by a video enabled telemedicine application and verified that I am speaking with the correct person using two identifiers.  Location: Patient:Home Provider: Office Participants: patient, husband, daughter and provider  I discussed the limitations of evaluation and management by telemedicine and the availability of in person appointments. I also discussed with the patient that there may be a patient responsible charge related to this service. The patient expressed understanding and agreed to proceed.  CC: disc compression fracture, osteoporosis, Memory lapse  History of Present Illness: Episodic memory loss Ongoing since CVA 07/2021, per husband: forgets to take medications, forgets what medication is for, forgets medical history. He wants eval for possible dementia.  Last appt with neurology 02/2022 [facility] today 25/30 Alert and oriented x4 today. Normal speech She has chronic generalized weakness, with no change Limited exam due to televiist appt  Since new referral was declined, I will discuss management plan and necessary f/up  with [facility] provider. Possible repeat MRI brain? Discussed care plan with Frann Rider, NP: since symptoms are not new, recommendation is to continue home ST and possibly advance to outpatient neuro rehab if no improvement.  Age-related osteoporosis with current pathological fracture Chronic use of glucocorticosteriod Per CT lumbar spine 03/2022: with chronic compression fracture L1-2 We discussed use of bisphosphonate vs prolia injection. Advised to discuss treatment option with endocrinology fur to severe renal impairement. They agreed to referral   Observations/Objective:  Assessment and Plan: [name] was seen today for office visit.  Diagnoses and all orders for this visit:  History of CVA with residual deficit  Episodic memory loss  Age-related osteoporosis with  current pathological fracture, initial encounter -     Ambulatory referral to Endocrinology   Follow Up Instructions: I have spent 76mns with this patient regarding history taking, documentation, review of labs, radiology, specialty note-psychiatry and neurology, formulating plan and discussing treatment options with patient.    I discussed the assessment and treatment plan with the patient. The patient was provided an opportunity to ask questions and all were answered. The patient agreed with the plan and demonstrated an understanding of the instructions.   The patient was advised to call back or seek an in-person evaluation if the symptoms worsen or if the condition fails to improve as anticipated.  CWilfred Lacy NP

## 2022-04-19 NOTE — Telephone Encounter (Signed)
PCP reached out via epic staff message. Thank you.

## 2022-04-19 NOTE — Telephone Encounter (Signed)
Willette Brace, NP who stated she would like Janett Billow NP to call her back directly. She did not want to share further information with me. Routed to NP.

## 2022-04-20 ENCOUNTER — Ambulatory Visit
Admission: RE | Admit: 2022-04-20 | Discharge: 2022-04-20 | Disposition: A | Discharge status: Home / Self Care | Payer: Medicare Other | Source: Ambulatory Visit | Attending: Nurse Practitioner | Admitting: Nurse Practitioner

## 2022-04-20 ENCOUNTER — Ambulatory Visit
Admission: RE | Admit: 2022-04-20 | Discharge: 2022-04-20 | Disposition: A | Discharge status: Home / Self Care | Payer: Medicare Other | Source: Ambulatory Visit | Attending: Family Medicine | Admitting: Family Medicine

## 2022-04-20 ENCOUNTER — Telehealth: Payer: Self-pay | Admitting: Nurse Practitioner

## 2022-04-20 DIAGNOSIS — M8000XA Age-related osteoporosis with current pathological fracture, unspecified site, initial encounter for fracture: Secondary | ICD-10-CM

## 2022-04-20 DIAGNOSIS — N281 Cyst of kidney, acquired: Secondary | ICD-10-CM

## 2022-04-20 DIAGNOSIS — I6930 Unspecified sequelae of cerebral infarction: Secondary | ICD-10-CM

## 2022-04-20 DIAGNOSIS — N2889 Other specified disorders of kidney and ureter: Secondary | ICD-10-CM

## 2022-04-20 DIAGNOSIS — M7989 Other specified soft tissue disorders: Secondary | ICD-10-CM

## 2022-04-20 DIAGNOSIS — S301XXS Contusion of abdominal wall, sequela: Secondary | ICD-10-CM

## 2022-04-20 DIAGNOSIS — R1900 Intra-abdominal and pelvic swelling, mass and lump, unspecified site: Secondary | ICD-10-CM | POA: Diagnosis not present

## 2022-04-20 DIAGNOSIS — R413 Other amnesia: Secondary | ICD-10-CM

## 2022-04-20 DIAGNOSIS — R600 Localized edema: Secondary | ICD-10-CM | POA: Diagnosis not present

## 2022-04-20 DIAGNOSIS — R935 Abnormal findings on diagnostic imaging of other abdominal regions, including retroperitoneum: Secondary | ICD-10-CM

## 2022-04-20 NOTE — Telephone Encounter (Signed)
LVM to CB Need to discuss need for neurology f/up and possible f/up with Dr. Hal Morales with [facility] vs ref to endocrinology

## 2022-04-20 NOTE — Telephone Encounter (Signed)
-----   Message from Jane Rider, NP sent at 04/19/2022  3:12 PM EDT ----- Regarding: RE: intermittent memory loss [name],   She has been having issues with memory since her stroke which was discussed at our prior visit. She was working with [facility] SLP at prior visit, if this has been completed, can consider sending her to outpatient neuro rehab SLP to work on cognition but unfortunately, there is not much that can be done besides therapy, routine memory exercises at home, ensuring good sleep, exercise, healthy eating habits and managing stroke risk factors to prevent this from further decline. As you know, she is also being followed by [facility], her mood disorder could also be contributing. I don't believe there is a reason for her to come back to see [name] as there is nothing further that we would be able to do for her. Please let me know if you have any questions or concerns about this.   Thank you! Jane Billow, NP   ----- Message ----- From: [name], NP Sent: 04/19/2022   2:52 PM EDT To: Jane Rider, NP Subject: intermittent memory loss                       Good Afternoon, [name] is a mutual patient. You last saw her in August and she was released from your care. Her husband reports intermittent lapse in memory (short and long term). He states she occasionally forgets to take some medications or unable to remember her medical history. This has been ongoing since CVA in January but getting worse. I entered a referral to re establish with your practice and to eval for dementia, but it was declined. I am reaching out to you to help facilitate getting another appt with your office to re evaluate her memory difficulty. Will it be beneficial to repeat an MRI?  Thank you for you help in advance  Jane Lacy, NP

## 2022-04-23 ENCOUNTER — Telehealth: Payer: Self-pay | Admitting: Nurse Practitioner

## 2022-04-23 ENCOUNTER — Other Ambulatory Visit: Payer: Medicare Other

## 2022-04-23 NOTE — Telephone Encounter (Signed)
LVM to CB.

## 2022-04-23 NOTE — Telephone Encounter (Signed)
Pt states his phone did not ring. He is returning call to [place]. Please call him again tomorrow at [phone]. If no answer please try [phone]. He said early morning is best for him.

## 2022-04-23 NOTE — Telephone Encounter (Signed)
error 

## 2022-04-24 DIAGNOSIS — M1030 Gout due to renal impairment, unspecified site: Secondary | ICD-10-CM | POA: Diagnosis not present

## 2022-04-24 DIAGNOSIS — N2581 Secondary hyperparathyroidism of renal origin: Secondary | ICD-10-CM | POA: Diagnosis not present

## 2022-04-24 DIAGNOSIS — D631 Anemia in chronic kidney disease: Secondary | ICD-10-CM | POA: Diagnosis not present

## 2022-04-24 DIAGNOSIS — I5042 Chronic combined systolic (congestive) and diastolic (congestive) heart failure: Secondary | ICD-10-CM | POA: Diagnosis not present

## 2022-04-24 DIAGNOSIS — I132 Hypertensive heart and chronic kidney disease with heart failure and with stage 5 chronic kidney disease, or end stage renal disease: Secondary | ICD-10-CM | POA: Diagnosis not present

## 2022-04-24 DIAGNOSIS — N185 Chronic kidney disease, stage 5: Secondary | ICD-10-CM | POA: Diagnosis not present

## 2022-04-24 NOTE — Addendum Note (Signed)
Addended by: Wilfred Lacy L on: 04/24/2022 10:06 AM   Modules accepted: Orders

## 2022-04-24 NOTE — Telephone Encounter (Signed)
Agreed to MRI ABD to further eval renal cysts, referral to outpatient neuro rehab, and referral to Dr. Hal Morales for osteoporosis management Informed about pelvic US results.

## 2022-04-25 ENCOUNTER — Telehealth: Payer: Self-pay | Admitting: Nurse Practitioner

## 2022-04-25 DIAGNOSIS — I5042 Chronic combined systolic (congestive) and diastolic (congestive) heart failure: Secondary | ICD-10-CM | POA: Diagnosis not present

## 2022-04-25 DIAGNOSIS — M1030 Gout due to renal impairment, unspecified site: Secondary | ICD-10-CM | POA: Diagnosis not present

## 2022-04-25 DIAGNOSIS — N185 Chronic kidney disease, stage 5: Secondary | ICD-10-CM | POA: Diagnosis not present

## 2022-04-25 DIAGNOSIS — N2581 Secondary hyperparathyroidism of renal origin: Secondary | ICD-10-CM | POA: Diagnosis not present

## 2022-04-25 DIAGNOSIS — I132 Hypertensive heart and chronic kidney disease with heart failure and with stage 5 chronic kidney disease, or end stage renal disease: Secondary | ICD-10-CM | POA: Diagnosis not present

## 2022-04-25 DIAGNOSIS — D631 Anemia in chronic kidney disease: Secondary | ICD-10-CM | POA: Diagnosis not present

## 2022-04-25 NOTE — Telephone Encounter (Signed)
Verbal orders given, written orders will be faxed over.  

## 2022-04-25 NOTE — Telephone Encounter (Signed)
[name] from [facility] is needing verbal orders for PT 1x/4w,  it's OK to leave orders on her voice mail. [name] @ [phone]

## 2022-04-26 ENCOUNTER — Encounter: Payer: Self-pay | Admitting: Family Medicine

## 2022-04-26 ENCOUNTER — Ambulatory Visit (INDEPENDENT_AMBULATORY_CARE_PROVIDER_SITE_OTHER): Payer: Medicare Other | Admitting: Family Medicine

## 2022-04-26 VITALS — BP 130/78 | Ht 65.0 in | Wt 112.0 lb

## 2022-04-26 DIAGNOSIS — S32020A Wedge compression fracture of second lumbar vertebra, initial encounter for closed fracture: Secondary | ICD-10-CM | POA: Diagnosis not present

## 2022-04-26 DIAGNOSIS — M8000XA Age-related osteoporosis with current pathological fracture, unspecified site, initial encounter for fracture: Secondary | ICD-10-CM | POA: Diagnosis not present

## 2022-04-26 DIAGNOSIS — S32010A Wedge compression fracture of first lumbar vertebra, initial encounter for closed fracture: Secondary | ICD-10-CM | POA: Diagnosis not present

## 2022-04-26 DIAGNOSIS — E2839 Other primary ovarian failure: Secondary | ICD-10-CM

## 2022-04-26 DIAGNOSIS — E559 Vitamin D deficiency, unspecified: Secondary | ICD-10-CM | POA: Diagnosis not present

## 2022-04-26 MED ORDER — CALCITONIN (SALMON) 200 UNIT/ACT NA SOLN: 1.0000 | Freq: Every day | NASAL | 1 refills | Status: DC

## 2022-04-26 NOTE — Assessment & Plan Note (Signed)
No prior bone density but with imaging demonstrating compression fractures. Having back pain for the past few months. Has a history of low trauma radius fracture and right hip fracture that led to hip arthroplasty. -Counseled on supportive care. - vitamin d, spep, ife, PTH and calcium  - bone density

## 2022-04-26 NOTE — Assessment & Plan Note (Signed)
Acute on chronic in nature.  - counseled on home exercise therapy and supportive care - calcitonin  - pursue back brace due to pain

## 2022-04-26 NOTE — Patient Instructions (Signed)
Nice to meet you Please try heat on the back  I will call with the results from today  The [employer] representative will call about the brace  We'll get the bone density scan downstairs.   Please send me a message in MyChart with any questions or updates.  I will call with the results and discuss the next steps .   --Dr. Raeford Razor

## 2022-04-26 NOTE — Progress Notes (Signed)
[name] - 76 y.o. female MRN [id number]  Date of birth: 1945-12-26  SUBJECTIVE:  Including CC & ROS.  No chief complaint on file.   [name] is a 76 y.o. female that is presenting with acute on chronic low back pain with recent diagnosis of compression fractures.  She denies any history of injury.  No significant family history.  She has lost height over the past few years.  No smoking history.  Previously on prednisone for a kidney transplant.  No history of cancer.  No previous treatment for osteoporosis..  Review of the office note from 10/5 shows she is counseled supportive care. Independent review of the CT lumbar spine from 9/18 shows chronic L1 and L2 compression fractures with probable sacral insufficiency fracture as 2 and 3.  Review of Systems See HPI   HISTORY: Past Medical, Surgical, Social, and Family History Reviewed & Updated per EMR.   Pertinent Historical Findings include:  Past Medical History:  Diagnosis Date   Acute diastolic (congestive) heart failure (HCC) 08/12/2017   Acute GI bleeding 11/14/2019   Acute kidney injury superimposed on chronic kidney disease ([place]) 11/17/2017   AKI (acute kidney injury) ([place]) 11/04/2017   Anemia    Anxiety    ARF (acute renal failure) ([place]) 06/06/2011   Arthritis    "shoulders; arms; hips" (02/04/2018)   Asthma    Atrial fibrillation (HCC)    Atrial flutter ([place])    s/p aflutter ablation at [facility]   Bacteriuria, asymptomatic 11/14/2020   Benign hypertension with ESRD (end-stage renal disease) ([place])    Blood transfusion    never had a reaction to blood transfusion   Cardiomyopathy Mar 2013   Mild, EF 50-55% by Mar 2013 ECHO, diast dysfxn II   Cardiomyopathy ([place])    CHF (congestive heart failure) ([place]) 2005   CHF (congestive heart failure) ([place])    Childhood asthma    Closed fracture of right distal radius 10/18/2016   CMV (cytomegalovirus infection) ([place]) 10/03/2015   Constipation    takes Miralax daily    Constipation    Depression    takes Zoloft daily   Distal radius fracture, right    DVT of lower extremity, bilateral ([place]) 12/21/11   "they're there now; been there for 2 wks"   Eczema    End stage renal disease ([place]) 11/06/2011   ESRD (end stage renal disease) on dialysis [facility]) 09/2011   07-19-2015 had Kidney transplant at [facility]; "don't get dialysis anymore" (02/04/2018)   Essential hypertension 07/07/2007   Qualifier: Diagnosis of  By: Garen Grams     Fractures, stress    in both feet--6 OR 7 YRS AGO--RESOLVED   Generalized edema [id number]   GI bleed 11/14/2019   Gout    doesn't require meds    HCAP (healthcare-associated pneumonia) 09/10/2011   Hearing difficulty    Hematoma of left lower leg 05/17/2020   High cholesterol    History of CVA (cerebrovascular accident) without residual deficits    History of hip replacement, total, right    History of pneumonia    Hx of Clostridium difficile infection    Hypercalcemia    09/10/11   Hyperparathyroidism ([place]) 04/07/2013   Hypertension    takes Diltiazem daily    Hypomagnesemia 08/02/2015   Hypophosphatemia 11/08/2017   ICB (intracranial bleed) ([place])    Immunosuppression ([place]) 07/25/2015   Memory changes    Morbid obesity ([place])    Nonischemic dilated cardiomyopathy ([place])    Obesity  04/07/2013   Oligouria    Oral mucositis 02/22/2018   OSA on CPAP    PAF (paroxysmal atrial fibrillation) (HCC)     HX OF CEREBRAL BLEED WHILE ON COUMADIN-SO PT NOT ON ANY BLOOD THINNERS NOW   Peripheral vascular disease ([place])    Persistent atrial fibrillation ([place]) 08/12/2017   Overview:  Added automatically from request for surgery [id number]  Formatting of this note might be different from the original. Added automatically from request for surgery [id number]   Presence of Watchman left atrial appendage closure device 10/04/2017   Proteinuria 09/04/2016   Psoriasis 08/07/2016   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/25/2015   S/P insertion of IVC (inferior vena  caval) filter    Sepsis ([place]) 11/08/2017   Stress fracture    bilateral feet   Stroke ([place]) 2009   denies residual on 02/04/2018;  hemorrhagic now off coumadin   Stroke ([place]) 07/17/2007   denies residual, hemorrhagic now off coumadin   Stroke due to intracerebral hemorrhage ([place]) 2009   Suture reaction 09/04/2016   Transfusion history    Use of cane as ambulatory aid     Past Surgical History:  Procedure Laterality Date   AV FISTULA PLACEMENT  09/28/2011   Procedure: ARTERIOVENOUS (AV) FISTULA CREATION;  Surgeon: Rosetta Posner, MD;  Location: [place];  Service: Vascular;  Laterality: Left;   BIOPSY  07/10/2021   Procedure: BIOPSY;  Surgeon: Irving Copas., MD;  Location: [facility];  Service: Gastroenterology;;   CARDIAC CATHETERIZATION     CARDIAC VALVE [place] DEFECT   CHOLECYSTECTOMY     1980's   COLONOSCOPY     COLONOSCOPY WITH PROPOFOL N/A 11/16/2019   Procedure: COLONOSCOPY WITH PROPOFOL;  Surgeon: Juanita Craver, MD;  Location: [facility] ENDOSCOPY;  Service: Endoscopy;  Laterality: N/A;   CYSTOSCOPY     many yrs ago   ESOPHAGOGASTRODUODENOSCOPY N/A 07/10/2021   Procedure: ESOPHAGOGASTRODUODENOSCOPY (EGD);  Surgeon: Irving Copas., MD;  Location: [facility];  Service: Gastroenterology;  Laterality: N/A;   ESOPHAGOGASTRODUODENOSCOPY (EGD) WITH PROPOFOL N/A 11/16/2019   Procedure: ESOPHAGOGASTRODUODENOSCOPY (EGD) WITH PROPOFOL;  Surgeon: Juanita Craver, MD;  Location: [facility] ENDOSCOPY;  Service: Endoscopy;  Laterality: N/A;   ESOPHAGOGASTRODUODENOSCOPY (EGD) WITH PROPOFOL N/A 03/20/2021   Procedure: ESOPHAGOGASTRODUODENOSCOPY (EGD) WITH PROPOFOL;  Surgeon: Milus Banister, MD;  Location: [facility] ENDOSCOPY;  Service: Endoscopy;  Laterality: N/A;   FEMUR FRACTURE SURGERY Right 03/2011; 09/03/2011   "had 2, 2 wk apart in 2012; broke it again 08/2011 & had OR"   [place] N/A 07/10/2021   Procedure: FLEXIBLE SIGMOIDOSCOPY;  Surgeon: Rush Landmark  Telford Nab., MD;  Location: [facility];  Service: Gastroenterology;  Laterality: N/A;   FRACTURE SURGERY     GIVENS CAPSULE STUDY N/A 07/11/2021   Procedure: GIVENS CAPSULE STUDY;  Surgeon: Juanita Craver, MD;  Location: [facility] ENDOSCOPY;  Service: Endoscopy;  Laterality: N/A;   HEMOSTASIS CONTROL  03/20/2021   Procedure: HEMOSTASIS CONTROL;  Surgeon: Milus Banister, MD;  Location: [place];  Service: Endoscopy;;   HOT HEMOSTASIS N/A 03/20/2021   Procedure: HOT HEMOSTASIS (ARGON PLASMA COAGULATION/BICAP);  Surgeon: Milus Banister, MD;  Location: [facility] ENDOSCOPY;  Service: Endoscopy;  Laterality: N/A;   HOT HEMOSTASIS N/A 07/10/2021   Procedure: HOT HEMOSTASIS (ARGON PLASMA COAGULATION/BICAP);  Surgeon: Irving Copas., MD;  Location: [facility];  Service: Gastroenterology;  Laterality: N/A;   I & D EXTREMITY  09/15/2011   Procedure: IRRIGATION AND DEBRIDEMENT EXTREMITY w REMOVAL OF  HARDWARE;  Surgeon: [name], MD;  Location: [place];  Service: Orthopedics;  Laterality: Right;   INCISION AND DRAINAGE HIP  12/21/2011   Procedure: IRRIGATION AND DEBRIDEMENT HIP;  Surgeon: [name], MD;  Location: [place];  Service: Orthopedics;  Laterality: Right;  I&D RIGHT HIP WITH PLACEMENT ANTIBIOTIC SPACER   INSERTION OF DIALYSIS CATHETER     Procedure: INSERTION OF DIALYSIS CATHETER;  Surgeon: Rosetta Posner, MD;  Location: [place];  Service: Vascular;  Laterality: Right;   IRRIGATION AND DEBRIDEMENT ABSCESS  12/21/11   right hip   JOINT REPLACEMENT     KIDNEY TRANSPLANT  07/19/15   LAPAROSCOPIC GASTRIC BANDING  2008   LEFT ATRIAL APPENDAGE OCCLUSION  10/04/2017   OPEN REDUCTION INTERNAL FIXATION (ORIF) DISTAL RADIAL FRACTURE Right 10/18/2016   Procedure: OPEN REDUCTION INTERNAL FIXATION (ORIF) RIGHT DISTAL RADIAL FRACTURE;  Surgeon: Marchia Bond, MD;  Location: [place];  Service: Orthopedics;  Laterality: Right;   SUBMUCOSAL TATTOO INJECTION  07/10/2021   Procedure: SUBMUCOSAL TATTOO  INJECTION;  Surgeon: Irving Copas., MD;  Location: [facility];  Service: Gastroenterology;;   TOTAL HIP ARTHROPLASTY Right 02/2011   right THA 02/2011, I&D/removal of hardware 09/2011,, repeat I&D Jun 2013, reimplantation R THA 03-26-2012   TOTAL HIP REVISION  03/25/2012   Procedure: TOTAL HIP REVISION;  Surgeon: [name], MD;  Location: [facility];  Service: Orthopedics;  Laterality: Right;  Right Total Hip Reimplantation   TUBAL LIGATION     VENA CAVA FILTER PLACEMENT  11/2011     PHYSICAL EXAM:  VS: BP 130/78 (BP Location: Right Arm, Patient Position: Sitting)   Ht '5\' 5"'$  (1.651 m)   Wt 112 lb (50.8 kg)   BMI 18.64 kg/m  Physical Exam Gen: NAD, alert, cooperative with exam, well-appearing MSK:  Neurovascularly intact     ASSESSMENT & PLAN:   Compression fracture of L2 lumbar vertebra (HCC) Acute on chronic in nature.  - counseled on home exercise therapy and supportive care - calcitonin  - pursue back brace due to pain   Compression fracture of L1 lumbar vertebra (HCC) Acute on chronic in nature.  - counseled on home exercise therapy and supportive care - calcitonin  - pursue back brace due to pain   Age-related osteoporosis with current pathological fracture No prior bone density but with imaging demonstrating compression fractures. Having back pain for the past few months. Has a history of low trauma radius fracture and right hip fracture that led to hip arthroplasty. -Counseled on supportive care. - vitamin d, spep, ife, PTH and calcium  - bone density

## 2022-04-30 ENCOUNTER — Other Ambulatory Visit (HOSPITAL_BASED_OUTPATIENT_CLINIC_OR_DEPARTMENT_OTHER): Payer: Medicare Other

## 2022-05-01 ENCOUNTER — Ambulatory Visit: Payer: Medicare Other | Admitting: Nurse Practitioner

## 2022-05-01 ENCOUNTER — Other Ambulatory Visit: Payer: Medicare Other | Admitting: Internal Medicine

## 2022-05-01 ENCOUNTER — Telehealth: Payer: Self-pay | Admitting: Nurse Practitioner

## 2022-05-01 VITALS — BP 162/80 | HR 72 | Temp 97.8°F

## 2022-05-01 DIAGNOSIS — F01B3 Vascular dementia, moderate, with mood disturbance: Secondary | ICD-10-CM

## 2022-05-01 DIAGNOSIS — R634 Abnormal weight loss: Secondary | ICD-10-CM

## 2022-05-01 DIAGNOSIS — E559 Vitamin D deficiency, unspecified: Secondary | ICD-10-CM | POA: Diagnosis not present

## 2022-05-01 DIAGNOSIS — M8000XA Age-related osteoporosis with current pathological fracture, unspecified site, initial encounter for fracture: Secondary | ICD-10-CM | POA: Diagnosis not present

## 2022-05-01 DIAGNOSIS — Z515 Encounter for palliative care: Secondary | ICD-10-CM

## 2022-05-01 DIAGNOSIS — R0902 Hypoxemia: Secondary | ICD-10-CM

## 2022-05-01 NOTE — Telephone Encounter (Signed)
Pt was a no show for an OV with [place] on 05/01/22, I sent a letter.

## 2022-05-01 NOTE — Telephone Encounter (Signed)
Attempted to call pt's daughter at number provided. VM is full. Pt's forms ( medication list, all providers) are up front in the pick up area waiting for someone to come get them.

## 2022-05-01 NOTE — Progress Notes (Signed)
[facility] Palliative Care Follow-Up Visit Telephone: [phone]  Fax: [phone]   Date of encounter: 05/01/22 11:33 AM PATIENT NAME: [name] [address] [place] [address]   [phone] (home)  DOB: 03/06/1946 MRN: [id number] PRIMARY CARE PROVIDER:    [name], NP,  [place] [address] [phone]  REFERRING PROVIDER:   [name], NP [place],  [place] [address] [phone]  RESPONSIBLE PARTY:    Contact Information     Name Relation Home Work Mobile   [place] Spouse [phone]  [place] Daughter   [phone]   [place]ural [place] Daughter   [phone]   simmon,dana Daughter   [phone]        I met face to face with patient and family in her home with her husband in person and two daughters on the phone. Palliative Care was asked to follow this patient by consultation request of  [name], [name], NP to address advance care planning and complex medical decision making. This is follow-up visit.                                     ASSESSMENT AND PLAN / RECOMMENDATIONS:   Advance Care Planning/Goals of Care: Goals include to maximize quality of life and symptom management. Patient/health care surrogate gave his/her permission to discuss.Our advance care planning conversation included a discussion about:    The value and importance of advance care planning  Experiences with loved ones who have been seriously ill or have died  Exploration of personal, cultural or spiritual beliefs that might influence medical decisions  Exploration of goals of care in the event of a sudden injury or illness  Identification  of a healthcare agent  Review and updating or creation of an  advance directive document . Decision not to resuscitate or to de-escalate disease focused treatments due to poor prognosis. CODE STATUS:  full code;  however, extensive discussion held with pt, husband, daughters on the phone There was a desire to try to improve her appetite Pt was not taking meds as directed including recently adjusted zoloft (per psych)--was often missing meds Recommended taking regularly and seeing if spirits improved; however, my impression is that pt is nearing end of life with significant frailty, cachexia, renal failure and now even hypoxic respiratory failure.  Her husband understands this and reports preparing for the end of her life for years with her prior transplants and episodes of acute on chronic illness.  One of their daughters is not yet ready for this.  Pt does not want to take meds, not eating, overall declining.  I recommend hospice.  Symptom Management/Plan: 1. Weight loss -appears entirely different than last time I saw her -she is not eating, not taking meds consistently   2. 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 Hypoxia -noted and her husband says sats drop into upper 80s fairly regularly off and on  4. Palliative care by specialist -recommended hospice to her husband, but one of daughters was not ready for this -trying to take antidepressant consistently, drink at least one shake daily and monitor    Follow up Palliative Care Visit: Palliative care will continue to follow for complex medical decision making, advance care planning, and clarification  of goals. Needs f/u by RN/SW as nearing hospice.  This visit was coded based on medical decision making (MDM).  PPS: 30%  HOSPICE ELIGIBILITY/DIAGNOSIS: Yes but not ready  Chief Complaint: Follow-up palliative visit  HISTORY OF PRESENT ILLNESS:  [name] is a 76 y.o. year old female  with mild cognitive impairment, prior stroke with RLE weakness despite her watchman procedure for afib s/p AVN ablation and PPM  placement, pelvic masses vs cysts, chronic systolic chf, post-inflammatory pulmonary fibrosis, depression, CKD5 s/p renal transplant in 2017 and now recently overall decline with poor intake, depression, falls, and back pain for which she's to get imaging and possible injection.   History obtained from review of EMR, discussion with primary team, and interview with family, facility staff/caregiver and/or [name].   I reviewed available labs, medications, imaging, studies and related documents from the EMR.  Records reviewed and summarized above.   ROS Review of Systems  Physical Exam: There were no vitals filed for this visit. There is no height or weight on file to calculate BMI. Wt Readings from Last 500 Encounters:  04/26/22 112 lb (50.8 kg)  04/19/22 112 lb (50.8 kg)  04/18/22 112 lb (50.8 kg)  03/29/22 112 lb (50.8 kg)  03/13/22 108 lb 2 oz (49 kg)  03/06/22 108 lb (49 kg)  02/16/22 111 lb (50.3 kg)  02/08/22 111 lb 3.2 oz (50.4 kg)  01/23/22 111 lb 12.8 oz (50.7 kg)  01/11/22 105 lb (47.6 kg)  12/25/21 113 lb 3.2 oz (51.3 kg)  11/29/21 109 lb 3.2 oz (49.5 kg)  11/21/21 108 lb (49 kg)  11/14/21 108 lb (49 kg)  11/08/21 117 lb (53.1 kg)  10/10/21 109 lb (49.4 kg)  09/08/21 117 lb (53.1 kg)  08/23/21 115 lb (52.2 kg)  07/25/21 123 lb 3.1 oz (55.9 kg)  07/21/21 123 lb 3.2 oz (55.9 kg)  07/11/21 115 lb 11.9 oz (52.5 kg)  06/21/21 126 lb 3.2 oz (57.2 kg)  06/14/21 125 lb (56.7 kg)  06/13/21 125 lb 12.8 oz (57.1 kg)  05/19/21 128 lb 6.4 oz (58.2 kg)  04/26/21 133 lb (60.3 kg)  03/29/21 141 lb 9.6 oz (64.2 kg)  03/25/21 153 lb (69.4 kg)  01/26/21 137 lb 6.4 oz (62.3 kg)  01/10/21 127 lb 3.2 oz (57.7 kg)  01/05/21 125 lb (56.7 kg)  11/14/20 139 lb 6.4 oz (63.2 kg)  11/03/20 142 lb 12.8 oz (64.8 kg)  06/29/20 138 lb 12.8 oz (63 kg)  05/17/20 141 lb (64 kg)  05/05/20 134 lb (60.8 kg)  04/12/20 134 lb 9.6 oz (61.1 kg)  03/24/20 134 lb (60.8 kg)  03/12/20 142 lb 10.2 oz  (64.7 kg)  03/10/20 142 lb 12.8 oz (64.8 kg)  02/04/20 145 lb (65.8 kg)  01/28/20 144 lb (65.3 kg)  01/04/20 146 lb 3.2 oz (66.3 kg)  12/29/19 148 lb (67.1 kg)  12/03/19 145 lb 6.4 oz (66 kg)  11/27/19 141 lb (64 kg)  11/19/19 150 lb 6.4 oz (68.2 kg)  11/16/19 139 lb (63 kg)  11/04/19 148 lb 12.8 oz (67.5 kg)  10/28/19 154 lb 3.2 oz (69.9 kg)  10/25/19 156 lb 4.9 oz (70.9 kg)  10/22/19 150 lb 6.4 oz (68.2 kg)  05/29/19 154 lb 9.6 oz (70.1 kg)  04/22/19 152 lb 3.2 oz (69 kg)  12/29/18 151 lb (68.5 kg)  11/25/18 156 lb (70.8 kg)  11/05/18 157 lb 12.8 oz (71.6 kg)  09/10/18 165 lb (74.8 kg)  08/26/18 162 lb 9.6 oz (73.8 kg)  07/29/18 164 lb 12.8 oz (74.8 kg)  07/21/18 179 lb 9.6 oz (81.5 kg)  07/01/18 178 lb (80.7 kg)  06/26/18 176 lb (79.8 kg)  06/23/18 176 lb (79.8 kg)  06/20/18 182 lb (82.6 kg)  03/24/18 194 lb 3.2 oz (88.1 kg)  03/14/18 201 lb (91.2 kg)  03/07/18 201 lb (91.2 kg)  02/19/18 222 lb (100.7 kg)  02/05/18 222 lb 3.6 oz (100.8 kg)  12/31/17 214 lb 3.2 oz (97.2 kg)  12/16/17 214 lb 9.6 oz (97.3 kg)  12/10/17 218 lb (98.9 kg)  12/04/17 221 lb (100.2 kg)  12/02/17 220 lb (99.8 kg)  11/13/17 230 lb (104.3 kg)  11/03/17 230 lb 13.2 oz (104.7 kg)  10/02/17 224 lb (101.6 kg)  09/04/17 229 lb (103.9 kg)  09/02/17 231 lb 9.6 oz (105.1 kg)  07/31/17 246 lb 3.2 oz (111.7 kg)  07/26/17 249 lb (112.9 kg)  07/26/17 249 lb 12.8 oz (113.3 kg)  07/19/17 252 lb 12.8 oz (114.7 kg)  06/30/17 232 lb (105.2 kg)  04/19/17 240 lb (108.9 kg)  04/01/17 244 lb (110.7 kg)  10/18/16 232 lb (105.2 kg)  09/18/16 233 lb 3.2 oz (105.8 kg)  05/08/16 223 lb 9.6 oz (101.4 kg)  09/13/15 228 lb 6.4 oz (103.6 kg)  09/07/14 241 lb 3.2 oz (109.4 kg)  08/12/14 240 lb (108.9 kg)  06/17/14 232 lb (105.2 kg)  03/02/14 230 lb (104.3 kg)  12/01/13 233 lb (105.7 kg)  05/28/13 214 lb (97.1 kg)  05/14/13 225 lb (102.1 kg)  05/08/13 220 lb (99.8 kg)  05/15/12 208 lb (94.3 kg)  03/31/12 216 lb  4.3 oz (98.1 kg)  03/11/12 191 lb (86.6 kg)  02/19/12 194 lb (88 kg)  12/24/11 218 lb 11.1 oz (99.2 kg)  12/08/11 223 lb (101.2 kg)  12/04/11 202 lb (91.6 kg)  11/09/11 202 lb (91.6 kg)  11/06/11 202 lb (91.6 kg)  10/01/11 222 lb 14.2 oz (101.1 kg)  09/03/11 260 lb (117.9 kg)  08/28/11 260 lb (117.9 kg)  06/06/11 260 lb 12.9 oz (118.3 kg)  02/02/11 260 lb (117.9 kg)  11/08/10 (!) 253 lb (114.8 kg)  10/10/10 (!) 252 lb (114.3 kg)  09/27/10 (!) 260 lb 9.6 oz (118.2 kg)  08/01/06 (!) 319 lb (144.7 kg)   Physical Exam  CURRENT PROBLEM LIST:  Patient Active Problem List   Diagnosis Date Noted   Compression fracture of L1 lumbar vertebra ([place]) 04/26/2022   Compression fracture of L2 lumbar vertebra ([place]) 04/26/2022   Vitamin D deficiency 04/26/2022   Episodic memory loss 04/19/2022   Age-related osteoporosis with current pathological fracture 04/19/2022   Protein-calorie malnutrition ([place]) 03/30/2022   ABLA (acute blood loss anemia) 01/19/2022   Bilateral renal cysts 01/19/2022   At high risk for falls 01/11/2022   Hematoma 01/11/2022   Right lower quadrant abdominal mass 12/25/2021   Postinflammatory pulmonary fibrosis ([place]) 11/14/2021   Renal transplant recipient 08/13/2021   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   Immunocompromised patient ([place]) 08/13/2021   Chronic atrial fibrillation (HCC)    Right leg weakness 07/23/2021   Erosive esophagitis/esophageal ulceration/duodenal ulceration 07/23/2021   Edema of right lower leg 07/23/2021   Acute GI bleeding 07/08/2021   Azotemia 07/08/2021   Pressure injury of skin 07/08/2021   Asthma, chronic, unspecified asthma severity, with acute exacerbation 06/15/2021   Acute on chronic combined systolic and diastolic CHF (congestive heart failure) ([place]) 06/15/2021   Duodenal  ulcer 03/29/2021   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   Gastroesophageal reflux disease 06/29/2020   Iron deficiency anemia 06/29/2020    Traumatic seroma of lower leg, right, subsequent encounter 01/28/2020   Other secondary pulmonary hypertension ([place]) 12/03/2019   Anticoagulation management encounter 11/04/2019   Pacemaker 04/18/2018   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   Personal history of PE (pulmonary embolism) 16/96/7893   Chronic systolic heart failure ([place]) 02/11/2018   Hyperkalemia 02/03/2018   Lower extremity edema 12/10/2017   Presence of Watchman left atrial appendage closure device 10/04/2017   Dyspnea 07/26/2017   Atrial flutter ([place])    Oligouria    Hypertension    Fractures, stress    Eczema    Depression    Constipation    Arthritis    Exacerbation of asthma 04/01/2017   Closed fracture of right distal radius 10/18/2016   Proteinuria 09/04/2016   History of kidney transplant 2017 07/25/2015   Immunosuppression ([place]) 07/25/2015   Nonischemic dilated cardiomyopathy (HCC)    S/P insertion of IVC (inferior vena caval) filter 04/07/2013   Use of cane as ambulatory aid 04/07/2013   S/P hip replacement 03/25/2012   Personal history of DVT/PE s/p IVC filter 12/08/2011   Obstructive sleep apnea 12/08/2011   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[place]) 10/24/2011   CKD (chronic kidney disease), stage V ([place]) 09/14/2011   Hypercalcemia    Gout 09/10/2011   Acute blood loss anemia secondary to diverticular bleed  09/04/2011   Absence of right hip joint with antibiotic pacer 09/03/2011   Traumatic seroma of thigh ([place]) 06/11/2011   Clostridium difficile colitis 06/09/2011   Symptomatic anemia 06/06/2011   SINUSITIS- ACUTE-NOS 09/27/2010   Atrial fibrillation s/p AVN ablation/PPM implant, s/p LAAC with Watchman 11/25/2008   Cerebral artery occlusion with cerebral infarction ([place]) 09/28/2008   History of CVA with residual deficit 09/28/2008   Hemiplegia ([place]) 09/21/2008   Psoriasis 10/20/2007   Asthmatic bronchitis without complication 81/07/7508    PAST MEDICAL HISTORY:  Active Ambulatory Problems     Diagnosis Date Noted   Hemiplegia ([place]) 09/21/2008   Atrial fibrillation s/p AVN ablation/PPM implant, s/p LAAC with Watchman 11/25/2008   Cerebral artery occlusion with cerebral infarction ([place]) 09/28/2008   Asthmatic bronchitis without complication 25/85/2778   Psoriasis 10/20/2007   History of CVA with residual deficit 09/28/2008   SINUSITIS- ACUTE-NOS 09/27/2010   Symptomatic anemia 06/06/2011   Clostridium difficile colitis 06/09/2011   Traumatic seroma of thigh ([place]) 06/11/2011   Absence of right hip joint with antibiotic pacer 09/03/2011   Acute blood loss anemia secondary to diverticular bleed  09/04/2011   Gout 09/10/2011   Hypercalcemia    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[place]) 10/24/2011   Personal history of DVT/PE s/p IVC filter 12/08/2011   Obstructive sleep apnea 12/08/2011   S/P hip replacement 03/25/2012   Nonischemic dilated cardiomyopathy ([place])    History of kidney transplant 2017 07/25/2015   Closed fracture of right distal radius 10/18/2016   Exacerbation of asthma 04/01/2017   Oligouria    Hypertension    Fractures, stress    CKD (chronic kidney disease), stage V ([place]) 09/14/2011   Eczema    Depression    Constipation    Arthritis    Atrial flutter (HCC)    Dyspnea 07/26/2017   Lower extremity edema 12/10/2017   Hyperkalemia 02/03/2018   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   Personal history of PE (pulmonary embolism) 02/22/2018   S/P insertion of IVC (inferior vena  caval) filter 16/04/9603   Chronic systolic heart failure ([place]) 02/11/2018   Immunosuppression ([place]) 07/25/2015   Use of cane as ambulatory aid 04/07/2013   Pacemaker 04/18/2018   Presence of Watchman left atrial appendage closure device 10/04/2017   Proteinuria 09/04/2016   Anticoagulation management encounter 11/04/2019   Other secondary pulmonary hypertension ([place]) 12/03/2019   Traumatic seroma of lower leg, right, subsequent encounter 01/28/2020   Diverticular disease of colon  06/29/2020   Gastroesophageal reflux disease 06/29/2020   Iron deficiency anemia 06/29/2020   Duodenal ulcer 03/29/2021   Asthma, chronic, unspecified asthma severity, with acute exacerbation 06/15/2021   Acute on chronic combined systolic and diastolic CHF (congestive heart failure) ([place]) 06/15/2021   Acute GI bleeding 07/08/2021   Azotemia 07/08/2021   Pressure injury of skin 07/08/2021   Right leg weakness 07/23/2021   Erosive esophagitis/esophageal ulceration/duodenal ulceration 07/23/2021   Edema of right lower leg 07/23/2021   Chronic atrial fibrillation ([place])    Renal transplant recipient 08/13/2021   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   Immunocompromised patient ([place]) 08/13/2021   Postinflammatory pulmonary fibrosis ([place]) 11/14/2021   Right lower quadrant abdominal mass 12/25/2021   At high risk for falls 01/11/2022   Hematoma 01/11/2022   ABLA (acute blood loss anemia) 01/19/2022   Bilateral renal cysts 01/19/2022   Protein-calorie malnutrition ([place]) 03/30/2022   Episodic memory loss 04/19/2022   Age-related osteoporosis with current pathological fracture 04/19/2022   Compression fracture of L1 lumbar vertebra ([place]) 04/26/2022   Compression fracture of L2 lumbar vertebra ([place]) 04/26/2022   Vitamin D deficiency 04/26/2022   Resolved Ambulatory Problems    Diagnosis Date Noted   TINEA CORPORIS 11/03/2008   Morbid obesity ([place]) 10/20/2007   DEPRESSION 11/25/2008   Essential hypertension 07/07/2007   Acute kidney injury superimposed on CKD ([place]) 11/25/2008   SEBACEOUS CYST, BREAST 11/03/2008   Nausea with vomiting 10/04/2009   Abdominal pain, unspecified site 10/04/2009   Low back pain radiating to right leg 10/10/2010   Hypokalemia 06/06/2011   ARF (acute renal failure) ([place]) 06/06/2011   Leukocytosis 06/08/2011   Acute-on-chronic kidney injury ([place]) 09/10/2011   Dehydration 09/10/2011   Community acquired pneumonia of left lower lobe of lung 09/10/2011    HCAP (healthcare-associated pneumonia)    Pseudomonas aeruginosa infection 10/24/2011   ESRD (end stage renal disease) ([place]) 10/24/2011   End stage renal disease ([place]) 11/06/2011   Abscess of hip, right 12/24/2011   Constipation due to pain medication 02/12/2012   Expected blood loss anemia 03/26/2012   Hypotension 03/27/2012   Abscess of shoulder 05/15/2012   Skin ulceration ([place]) 05/09/2013   Stroke due to intracerebral hemorrhage ([place]) 07/17/2007   Stroke ([place]) 07/17/2007   Sleep apnea    Peripheral vascular disease ([place])    DVT of lower extremity, bilateral ([place]) 12/21/2011   Distal radius fracture, right    Acute respiratory failure with hypoxia ([place]) 09/04/2017   Chronic respiratory failure with hypoxia ([place]) 10/03/2017   UTI (urinary tract infection) 11/03/2017   Diarrhea 11/03/2017   Septic shock ([place]) 11/03/2017   Acute kidney injury superimposed on chronic kidney disease ([place]) 54/03/8118   Acute diastolic (congestive) heart failure ([place]) 11/17/2017   Abscess of right leg 12/14/2017   Atrial fibrillation with RVR ([place]) 02/03/2018   Persistent atrial fibrillation ([place]) 08/12/2017   Oral mucositis 02/22/2018   S/P AV nodal ablation 10/08/2018   Acute on chronic combined systolic and diastolic congestive heart failure ([place]) 10/22/2019   COPD exacerbation (  [place])    Acute CHF (congestive heart failure) ([place]) 10/23/2019   Acute GI bleeding 11/14/2019   GI bleed 11/14/2019   Wound of right leg 01/28/2020   SOB (shortness of breath) 03/11/2020   Pneumonia due to COVID-19 virus 03/11/2020   Hematoma of left lower leg 05/17/2020   Bacteriuria, asymptomatic 11/14/2020   Acute respiratory failure with hypoxia ([place]) 06/14/2021   Acute renal failure ([place]) 06/15/2021   Acute CVA (cerebrovascular accident) ([place]) 07/25/2021   Past Medical History:  Diagnosis Date   AKI (acute kidney injury) ([place]) 11/04/2017   Anemia    Anxiety    Asthma    Atrial fibrillation ([place])     Benign hypertension with ESRD (end-stage renal disease) ([place])    Blood transfusion    Cardiomyopathy Mar 2013   Cardiomyopathy [facility])    CHF (congestive heart failure) ([place]) 2005   CHF (congestive heart failure) ([place])    Childhood asthma    CMV (cytomegalovirus infection) ([place]) 10/03/2015   ESRD (end stage renal disease) on dialysis ([place]) 09/2011   Generalized edema [id number]   Gout    Hearing difficulty    High cholesterol    History of CVA (cerebrovascular accident) without residual deficits    History of hip replacement, total, right    History of pneumonia    Hx of Clostridium difficile infection    Hyperparathyroidism ([place]) 04/07/2013   Hypomagnesemia 08/02/2015   Hypophosphatemia 11/08/2017   ICB (intracranial bleed) (HCC)    Memory changes    Obesity 04/07/2013   OSA on CPAP    Sepsis ([place]) 11/08/2017   Stress fracture    Suture reaction 09/04/2016   Transfusion history     SOCIAL HX:  Social History   Tobacco Use   Smoking status: Never   Smokeless tobacco: Never  Substance Use Topics   Alcohol use: Never     ALLERGIES:  Allergies  Allergen Reactions   Warfarin Sodium Other (See Comments)    Caused her to have a stroke/left side of brain to hemorrhage    Ace Inhibitors Cough   Amiodarone Other (See Comments)    Pt with Restrictive Lung Disease by 10/2017 PFT with decreased DLCO   Amlodipine Swelling    Swelling in feet      PERTINENT MEDICATIONS:  Outpatient Encounter Medications as of 05/01/2022  Medication Sig   acetaminophen (TYLENOL) 500 MG tablet Take 500 mg by mouth every 6 (six) hours as needed for mild pain or headache.    albuterol (PROVENTIL) (2.5 MG/3ML) 0.083% nebulizer solution Take 3 mLs (2.5 mg total) by nebulization every 6 (six) hours as needed for wheezing or shortness of breath. Dx:J45.909   aspirin EC 81 MG tablet Take 81 mg by mouth daily. Swallow whole.   atorvastatin (LIPITOR) 20 MG tablet Take 20 mg by mouth daily.    budesonide-formoterol (SYMBICORT) 160-4.5 MCG/ACT inhaler Inhale 2 puffs into the lungs 2 (two) times daily.   calcitonin, salmon, (MIACALCIN) 200 UNIT/ACT nasal spray Place 1 spray into alternate nostrils daily.   carvedilol (COREG) 3.125 MG tablet Take 1 tablet (3.125 mg total) by mouth 2 (two) times daily with a meal.   docusate sodium (COLACE) 100 MG capsule Take 100 mg by mouth daily.   FLUoxetine (PROZAC) 20 MG capsule 2 qam   furosemide (LASIX) 80 MG tablet Take 80 mg by mouth 3 (three) times a week.   HYDROcodone-acetaminophen (NORCO/VICODIN) 5-325 MG tablet Take 1 tablet by mouth every 6 (six) hours as needed.  lactulose (CHRONULAC) 10 GM/15ML solution Take 30 mLs (20 g total) by mouth daily as needed for mild constipation or severe constipation.   mycophenolate (MYFORTIC) 180 MG EC tablet Take 180 mg by mouth 2 (two) times daily.   ondansetron (ZOFRAN) 4 MG tablet Take 1 tablet (4 mg total) by mouth every 8 (eight) hours as needed for up to 9 doses for nausea.   oxyCODONE (OXY IR/ROXICODONE) 5 MG immediate release tablet Take 1 tablet (5 mg total) by mouth every 6 (six) hours as needed for severe pain.   pantoprazole (PROTONIX) 20 MG tablet TAKE 1 TABLET BY MOUTH EVERY DAY   polyethylene glycol (MIRALAX / GLYCOLAX) 17 g packet Take 17 g by mouth daily. Hold if diarrhea   potassium chloride (MICRO-K) 10 MEQ CR capsule Take 10 mEq by mouth 3 (three) times a week.   predniSONE (DELTASONE) 5 MG tablet Take 5 mg by mouth daily.   tacrolimus (PROGRAF) 1 MG capsule Take 1 mg by mouth 2 (two) times daily.   No facility-administered encounter medications on file as of 05/01/2022.    Thank you for the opportunity to participate in the care of Ms. Tremain.  The palliative care team will continue to follow. Please call our office at [phone] if we can be of additional assistance.   Hollace Kinnier, DO  COVID-19 PATIENT SCREENING TOOL Asked and negative response unless otherwise noted:  Have  you had symptoms of covid, tested positive or been in contact with someone with symptoms/positive test in the past 5-10 days? no

## 2022-05-01 NOTE — Telephone Encounter (Signed)
Pt's daughter, [name] is wanting a call back concerning her mom. She is wanting to get her mom's medication list between [facility] and her nephrologist Dr. Harrie Jeans from [facility] updated. She wants to send the correct list to [facility] to help keep her mom's medications more organized.   She also wants a list of her mom's doctors. She feels her parents can not keep it straight.  Please advise [name] @ [phone]

## 2022-05-02 ENCOUNTER — Ambulatory Visit (HOSPITAL_COMMUNITY)
Admission: RE | Admit: 2022-05-02 | Discharge: 2022-05-02 | Disposition: A | Discharge status: Home / Self Care | Payer: Medicare Other | Source: Ambulatory Visit | Attending: Nephrology | Admitting: Nephrology

## 2022-05-02 VITALS — BP 161/83 | HR 70 | Temp 97.7°F | Resp 18

## 2022-05-02 DIAGNOSIS — N185 Chronic kidney disease, stage 5: Secondary | ICD-10-CM | POA: Insufficient documentation

## 2022-05-02 LAB — FERRITIN: Ferritin: 391 ng/mL — ABNORMAL HIGH (ref 11–307)

## 2022-05-02 LAB — POCT HEMOGLOBIN-HEMACUE: Hemoglobin: 13.1 g/dL (ref 12.0–15.0)

## 2022-05-02 LAB — IRON AND TIBC
Iron: 62 ug/dL (ref 28–170)
Saturation Ratios: 30 % (ref 10.4–31.8)
TIBC: 206 ug/dL — ABNORMAL LOW (ref 250–450)
UIBC: 144 ug/dL

## 2022-05-02 MED ORDER — EPOETIN ALFA-EPBX 10000 UNIT/ML IJ SOLN
INTRAMUSCULAR | Status: AC
  Filled 2022-05-02: qty 1

## 2022-05-02 MED ORDER — EPOETIN ALFA-EPBX 10000 UNIT/ML IJ SOLN: 10000.0000 [IU] | INTRAMUSCULAR | Status: DC

## 2022-05-03 ENCOUNTER — Other Ambulatory Visit: Payer: Self-pay | Admitting: Nurse Practitioner

## 2022-05-03 DIAGNOSIS — Z940 Kidney transplant status: Secondary | ICD-10-CM

## 2022-05-03 DIAGNOSIS — Z9181 History of falling: Secondary | ICD-10-CM

## 2022-05-03 DIAGNOSIS — N2581 Secondary hyperparathyroidism of renal origin: Secondary | ICD-10-CM | POA: Diagnosis not present

## 2022-05-03 DIAGNOSIS — M150 Primary generalized (osteo)arthritis: Secondary | ICD-10-CM

## 2022-05-03 DIAGNOSIS — G4733 Obstructive sleep apnea (adult) (pediatric): Secondary | ICD-10-CM

## 2022-05-03 DIAGNOSIS — Z7951 Long term (current) use of inhaled steroids: Secondary | ICD-10-CM

## 2022-05-03 DIAGNOSIS — Z86718 Personal history of other venous thrombosis and embolism: Secondary | ICD-10-CM

## 2022-05-03 DIAGNOSIS — I4819 Other persistent atrial fibrillation: Secondary | ICD-10-CM | POA: Diagnosis not present

## 2022-05-03 DIAGNOSIS — J45909 Unspecified asthma, uncomplicated: Secondary | ICD-10-CM

## 2022-05-03 DIAGNOSIS — Z7952 Long term (current) use of systemic steroids: Secondary | ICD-10-CM

## 2022-05-03 DIAGNOSIS — M1030 Gout due to renal impairment, unspecified site: Secondary | ICD-10-CM | POA: Diagnosis not present

## 2022-05-03 DIAGNOSIS — E7800 Pure hypercholesterolemia, unspecified: Secondary | ICD-10-CM

## 2022-05-03 DIAGNOSIS — H9190 Unspecified hearing loss, unspecified ear: Secondary | ICD-10-CM

## 2022-05-03 DIAGNOSIS — I6930 Unspecified sequelae of cerebral infarction: Secondary | ICD-10-CM

## 2022-05-03 DIAGNOSIS — F419 Anxiety disorder, unspecified: Secondary | ICD-10-CM

## 2022-05-03 DIAGNOSIS — N185 Chronic kidney disease, stage 5: Secondary | ICD-10-CM | POA: Diagnosis not present

## 2022-05-03 DIAGNOSIS — I428 Other cardiomyopathies: Secondary | ICD-10-CM | POA: Diagnosis not present

## 2022-05-03 DIAGNOSIS — I739 Peripheral vascular disease, unspecified: Secondary | ICD-10-CM | POA: Diagnosis not present

## 2022-05-03 DIAGNOSIS — I5042 Chronic combined systolic (congestive) and diastolic (congestive) heart failure: Secondary | ICD-10-CM | POA: Diagnosis not present

## 2022-05-03 DIAGNOSIS — D849 Immunodeficiency, unspecified: Secondary | ICD-10-CM

## 2022-05-03 DIAGNOSIS — D631 Anemia in chronic kidney disease: Secondary | ICD-10-CM | POA: Diagnosis not present

## 2022-05-03 DIAGNOSIS — K219 Gastro-esophageal reflux disease without esophagitis: Secondary | ICD-10-CM

## 2022-05-03 DIAGNOSIS — I69351 Hemiplegia and hemiparesis following cerebral infarction affecting right dominant side: Secondary | ICD-10-CM | POA: Diagnosis not present

## 2022-05-03 DIAGNOSIS — F330 Major depressive disorder, recurrent, mild: Secondary | ICD-10-CM

## 2022-05-03 DIAGNOSIS — K2210 Ulcer of esophagus without bleeding: Secondary | ICD-10-CM | POA: Diagnosis not present

## 2022-05-03 DIAGNOSIS — Z7901 Long term (current) use of anticoagulants: Secondary | ICD-10-CM

## 2022-05-03 DIAGNOSIS — I132 Hypertensive heart and chronic kidney disease with heart failure and with stage 5 chronic kidney disease, or end stage renal disease: Secondary | ICD-10-CM | POA: Diagnosis not present

## 2022-05-03 DIAGNOSIS — E46 Unspecified protein-calorie malnutrition: Secondary | ICD-10-CM | POA: Diagnosis not present

## 2022-05-03 NOTE — Telephone Encounter (Signed)
Chart supports Rx Last OV: 04/2022 Next OV: 05/2022  

## 2022-05-04 ENCOUNTER — Encounter: Payer: Self-pay | Admitting: Nurse Practitioner

## 2022-05-04 NOTE — Telephone Encounter (Signed)
My Chart message sent

## 2022-05-04 NOTE — Telephone Encounter (Signed)
[name] is wanting a cb @ [phone]. She is wanting to know why her mom was put back on DULoxetine (CYMBALTA) 30 MG capsule [[id number]]. She says her mom was taken off this last year. Please advise

## 2022-05-05 ENCOUNTER — Inpatient Hospital Stay (HOSPITAL_COMMUNITY)
Admission: EM | Admit: 2022-05-05 | Discharge: 2022-05-09 | DRG: 069 | Disposition: A | Discharge status: Skilled Nursing Facility | Payer: Medicare Other | Attending: Internal Medicine | Admitting: Internal Medicine

## 2022-05-05 ENCOUNTER — Other Ambulatory Visit: Payer: Self-pay

## 2022-05-05 ENCOUNTER — Encounter (HOSPITAL_COMMUNITY): Payer: Self-pay

## 2022-05-05 ENCOUNTER — Emergency Department (HOSPITAL_COMMUNITY): Payer: Medicare Other

## 2022-05-05 DIAGNOSIS — J45909 Unspecified asthma, uncomplicated: Secondary | ICD-10-CM | POA: Diagnosis present

## 2022-05-05 DIAGNOSIS — R2990 Unspecified symptoms and signs involving the nervous system: Principal | ICD-10-CM

## 2022-05-05 DIAGNOSIS — I482 Chronic atrial fibrillation, unspecified: Secondary | ICD-10-CM | POA: Diagnosis not present

## 2022-05-05 DIAGNOSIS — Z833 Family history of diabetes mellitus: Secondary | ICD-10-CM

## 2022-05-05 DIAGNOSIS — F32A Depression, unspecified: Secondary | ICD-10-CM | POA: Diagnosis present

## 2022-05-05 DIAGNOSIS — J45901 Unspecified asthma with (acute) exacerbation: Secondary | ICD-10-CM | POA: Diagnosis present

## 2022-05-05 DIAGNOSIS — Z7401 Bed confinement status: Secondary | ICD-10-CM | POA: Diagnosis not present

## 2022-05-05 DIAGNOSIS — I132 Hypertensive heart and chronic kidney disease with heart failure and with stage 5 chronic kidney disease, or end stage renal disease: Secondary | ICD-10-CM | POA: Diagnosis not present

## 2022-05-05 DIAGNOSIS — G459 Transient cerebral ischemic attack, unspecified: Principal | ICD-10-CM | POA: Diagnosis present

## 2022-05-05 DIAGNOSIS — Z86718 Personal history of other venous thrombosis and embolism: Secondary | ICD-10-CM | POA: Diagnosis not present

## 2022-05-05 DIAGNOSIS — Z8701 Personal history of pneumonia (recurrent): Secondary | ICD-10-CM

## 2022-05-05 DIAGNOSIS — I4819 Other persistent atrial fibrillation: Secondary | ICD-10-CM | POA: Diagnosis present

## 2022-05-05 DIAGNOSIS — I739 Peripheral vascular disease, unspecified: Secondary | ICD-10-CM | POA: Diagnosis present

## 2022-05-05 DIAGNOSIS — R531 Weakness: Secondary | ICD-10-CM | POA: Diagnosis not present

## 2022-05-05 DIAGNOSIS — N3001 Acute cystitis with hematuria: Secondary | ICD-10-CM | POA: Diagnosis not present

## 2022-05-05 DIAGNOSIS — I459 Conduction disorder, unspecified: Secondary | ICD-10-CM | POA: Diagnosis present

## 2022-05-05 DIAGNOSIS — N390 Urinary tract infection, site not specified: Secondary | ICD-10-CM | POA: Diagnosis present

## 2022-05-05 DIAGNOSIS — Z940 Kidney transplant status: Secondary | ICD-10-CM | POA: Diagnosis not present

## 2022-05-05 DIAGNOSIS — I2510 Atherosclerotic heart disease of native coronary artery without angina pectoris: Secondary | ICD-10-CM | POA: Diagnosis present

## 2022-05-05 DIAGNOSIS — L89152 Pressure ulcer of sacral region, stage 2: Secondary | ICD-10-CM | POA: Diagnosis present

## 2022-05-05 DIAGNOSIS — Z681 Body mass index (BMI) 19 or less, adult: Secondary | ICD-10-CM

## 2022-05-05 DIAGNOSIS — E440 Moderate protein-calorie malnutrition: Secondary | ICD-10-CM | POA: Diagnosis present

## 2022-05-05 DIAGNOSIS — N185 Chronic kidney disease, stage 5: Secondary | ICD-10-CM | POA: Diagnosis not present

## 2022-05-05 DIAGNOSIS — I1311 Hypertensive heart and chronic kidney disease without heart failure, with stage 5 chronic kidney disease, or end stage renal disease: Secondary | ICD-10-CM | POA: Diagnosis present

## 2022-05-05 DIAGNOSIS — Z95828 Presence of other vascular implants and grafts: Secondary | ICD-10-CM

## 2022-05-05 DIAGNOSIS — G9389 Other specified disorders of brain: Secondary | ICD-10-CM | POA: Diagnosis not present

## 2022-05-05 DIAGNOSIS — G9341 Metabolic encephalopathy: Secondary | ICD-10-CM | POA: Diagnosis present

## 2022-05-05 DIAGNOSIS — Z7982 Long term (current) use of aspirin: Secondary | ICD-10-CM

## 2022-05-05 DIAGNOSIS — Z807 Family history of other malignant neoplasms of lymphoid, hematopoietic and related tissues: Secondary | ICD-10-CM

## 2022-05-05 DIAGNOSIS — I5043 Acute on chronic combined systolic (congestive) and diastolic (congestive) heart failure: Secondary | ICD-10-CM | POA: Diagnosis present

## 2022-05-05 DIAGNOSIS — Z950 Presence of cardiac pacemaker: Secondary | ICD-10-CM | POA: Diagnosis not present

## 2022-05-05 DIAGNOSIS — E785 Hyperlipidemia, unspecified: Secondary | ICD-10-CM | POA: Diagnosis present

## 2022-05-05 DIAGNOSIS — K219 Gastro-esophageal reflux disease without esophagitis: Secondary | ICD-10-CM | POA: Diagnosis not present

## 2022-05-05 DIAGNOSIS — R42 Dizziness and giddiness: Secondary | ICD-10-CM | POA: Diagnosis not present

## 2022-05-05 DIAGNOSIS — Z9049 Acquired absence of other specified parts of digestive tract: Secondary | ICD-10-CM

## 2022-05-05 DIAGNOSIS — I6350 Cerebral infarction due to unspecified occlusion or stenosis of unspecified cerebral artery: Secondary | ICD-10-CM | POA: Diagnosis present

## 2022-05-05 DIAGNOSIS — F0393 Unspecified dementia, unspecified severity, with mood disturbance: Secondary | ICD-10-CM | POA: Diagnosis present

## 2022-05-05 DIAGNOSIS — I639 Cerebral infarction, unspecified: Secondary | ICD-10-CM | POA: Diagnosis not present

## 2022-05-05 DIAGNOSIS — Z8673 Personal history of transient ischemic attack (TIA), and cerebral infarction without residual deficits: Secondary | ICD-10-CM | POA: Diagnosis not present

## 2022-05-05 DIAGNOSIS — I5032 Chronic diastolic (congestive) heart failure: Secondary | ICD-10-CM | POA: Diagnosis present

## 2022-05-05 DIAGNOSIS — R64 Cachexia: Secondary | ICD-10-CM | POA: Diagnosis present

## 2022-05-05 DIAGNOSIS — I420 Dilated cardiomyopathy: Secondary | ICD-10-CM | POA: Diagnosis present

## 2022-05-05 DIAGNOSIS — E46 Unspecified protein-calorie malnutrition: Secondary | ICD-10-CM | POA: Diagnosis not present

## 2022-05-05 DIAGNOSIS — J320 Chronic maxillary sinusitis: Secondary | ICD-10-CM | POA: Diagnosis not present

## 2022-05-05 DIAGNOSIS — I69351 Hemiplegia and hemiparesis following cerebral infarction affecting right dominant side: Secondary | ICD-10-CM

## 2022-05-05 DIAGNOSIS — Z79899 Other long term (current) drug therapy: Secondary | ICD-10-CM

## 2022-05-05 DIAGNOSIS — Z96641 Presence of right artificial hip joint: Secondary | ICD-10-CM | POA: Diagnosis present

## 2022-05-05 DIAGNOSIS — I480 Paroxysmal atrial fibrillation: Secondary | ICD-10-CM | POA: Diagnosis not present

## 2022-05-05 DIAGNOSIS — Z8261 Family history of arthritis: Secondary | ICD-10-CM

## 2022-05-05 DIAGNOSIS — Z7952 Long term (current) use of systemic steroids: Secondary | ICD-10-CM

## 2022-05-05 DIAGNOSIS — E7800 Pure hypercholesterolemia, unspecified: Secondary | ICD-10-CM | POA: Diagnosis present

## 2022-05-05 DIAGNOSIS — R4781 Slurred speech: Secondary | ICD-10-CM | POA: Diagnosis not present

## 2022-05-05 DIAGNOSIS — F29 Unspecified psychosis not due to a substance or known physiological condition: Secondary | ICD-10-CM | POA: Diagnosis not present

## 2022-05-05 DIAGNOSIS — Z86711 Personal history of pulmonary embolism: Secondary | ICD-10-CM

## 2022-05-05 DIAGNOSIS — Z888 Allergy status to other drugs, medicaments and biological substances status: Secondary | ICD-10-CM

## 2022-05-05 DIAGNOSIS — Z95818 Presence of other cardiac implants and grafts: Secondary | ICD-10-CM | POA: Diagnosis not present

## 2022-05-05 DIAGNOSIS — Z7951 Long term (current) use of inhaled steroids: Secondary | ICD-10-CM

## 2022-05-05 DIAGNOSIS — R410 Disorientation, unspecified: Secondary | ICD-10-CM | POA: Diagnosis not present

## 2022-05-05 DIAGNOSIS — Z8249 Family history of ischemic heart disease and other diseases of the circulatory system: Secondary | ICD-10-CM

## 2022-05-05 DIAGNOSIS — G319 Degenerative disease of nervous system, unspecified: Secondary | ICD-10-CM | POA: Diagnosis not present

## 2022-05-05 LAB — URINALYSIS, ROUTINE W REFLEX MICROSCOPIC
Bilirubin Urine: NEGATIVE
Glucose, UA: 50 mg/dL — AB
Hgb urine dipstick: NEGATIVE
Ketones, ur: NEGATIVE mg/dL
Nitrite: NEGATIVE
Protein, ur: >=300 mg/dL — AB
Specific Gravity, Urine: 1.016 (ref 1.005–1.030)
WBC, UA: >50 WBC/hpf — ABNORMAL HIGH (ref 0–5)
pH: 7 (ref 5.0–8.0)

## 2022-05-05 LAB — COMPREHENSIVE METABOLIC PANEL
ALT: 12 U/L (ref 0–44)
AST: 15 U/L (ref 15–41)
Albumin: 2.9 g/dL — ABNORMAL LOW (ref 3.5–5.0)
Alkaline Phosphatase: 70 U/L (ref 38–126)
Anion gap: 5 (ref 5–15)
BUN: 48 mg/dL — ABNORMAL HIGH (ref 8–23)
CO2: 25 mmol/L (ref 22–32)
Calcium: 9 mg/dL (ref 8.9–10.3)
Chloride: 107 mmol/L (ref 98–111)
Creatinine, Ser: 3.41 mg/dL — ABNORMAL HIGH (ref 0.44–1.00)
GFR, Estimated: 14 mL/min — ABNORMAL LOW (ref >60)
Glucose, Bld: 111 mg/dL — ABNORMAL HIGH (ref 70–99)
Potassium: 4.5 mmol/L (ref 3.5–5.1)
Sodium: 137 mmol/L (ref 135–145)
Total Bilirubin: 0.7 mg/dL (ref 0.3–1.2)
Total Protein: 5.9 g/dL — ABNORMAL LOW (ref 6.5–8.1)

## 2022-05-05 LAB — RAPID URINE DRUG SCREEN, HOSP PERFORMED
Amphetamines: NOT DETECTED
Barbiturates: NOT DETECTED
Benzodiazepines: NOT DETECTED
Cocaine: NOT DETECTED
Opiates: NOT DETECTED
Tetrahydrocannabinol: NOT DETECTED

## 2022-05-05 LAB — CBC WITH DIFFERENTIAL/PLATELET
Abs Immature Granulocytes: 0.02 10*3/uL (ref 0.00–0.07)
Basophils Absolute: 0.1 10*3/uL (ref 0.0–0.1)
Basophils Relative: 1 %
Eosinophils Absolute: 0 10*3/uL (ref 0.0–0.5)
Eosinophils Relative: 1 %
HCT: 41.6 % (ref 36.0–46.0)
Hemoglobin: 12.4 g/dL (ref 12.0–15.0)
Immature Granulocytes: 0 %
Lymphocytes Relative: 15 %
Lymphs Abs: 0.7 10*3/uL (ref 0.7–4.0)
MCH: 27.3 pg (ref 26.0–34.0)
MCHC: 29.8 g/dL — ABNORMAL LOW (ref 30.0–36.0)
MCV: 91.4 fL (ref 80.0–100.0)
Monocytes Absolute: 0.5 10*3/uL (ref 0.1–1.0)
Monocytes Relative: 11 %
Neutro Abs: 3.5 10*3/uL (ref 1.7–7.7)
Neutrophils Relative %: 72 %
Platelets: 126 10*3/uL — ABNORMAL LOW (ref 150–400)
RBC: 4.55 MIL/uL (ref 3.87–5.11)
RDW: 18 % — ABNORMAL HIGH (ref 11.5–15.5)
WBC: 4.8 10*3/uL (ref 4.0–10.5)
nRBC: 0 % (ref 0.0–0.2)

## 2022-05-05 LAB — PROTIME-INR
INR: 1.3 — ABNORMAL HIGH (ref 0.8–1.2)
Prothrombin Time: 15.9 seconds — ABNORMAL HIGH (ref 11.4–15.2)

## 2022-05-05 LAB — APTT: aPTT: 34 seconds (ref 24–36)

## 2022-05-05 LAB — CBG MONITORING, ED: Glucose-Capillary: 103 mg/dL — ABNORMAL HIGH (ref 70–99)

## 2022-05-05 LAB — ETHANOL: Alcohol, Ethyl (B): <10 mg/dL (ref <10)

## 2022-05-05 MED ORDER — TACROLIMUS 1 MG PO CAPS
1.0000 mg | ORAL_CAPSULE | Freq: Two times a day (BID) | ORAL | Status: DC
  Administered 2022-05-05 – 2022-05-09 (×9): 1 mg via ORAL
  Filled 2022-05-05 (×10): qty 1

## 2022-05-05 MED ORDER — PANTOPRAZOLE SODIUM 20 MG PO TBEC
20.0000 mg | DELAYED_RELEASE_TABLET | Freq: Every day | ORAL | Status: DC
  Administered 2022-05-05 – 2022-05-09 (×5): 20 mg via ORAL
  Filled 2022-05-05 (×5): qty 1

## 2022-05-05 MED ORDER — SODIUM CHLORIDE 0.9 % IV SOLN
1.0000 g | Freq: Once | INTRAVENOUS | Status: AC
  Administered 2022-05-05: 1 g via INTRAVENOUS
  Filled 2022-05-05: qty 10

## 2022-05-05 MED ORDER — SODIUM CHLORIDE 0.9 % IV SOLN
1.0000 g | INTRAVENOUS | Status: AC
  Administered 2022-05-06 – 2022-05-08 (×3): 1 g via INTRAVENOUS
  Filled 2022-05-05 (×3): qty 10

## 2022-05-05 MED ORDER — FUROSEMIDE 40 MG PO TABS
80.0000 mg | ORAL_TABLET | ORAL | Status: DC
  Administered 2022-05-07 – 2022-05-09 (×2): 80 mg via ORAL
  Filled 2022-05-05 (×2): qty 2

## 2022-05-05 MED ORDER — MYCOPHENOLATE SODIUM 180 MG PO TBEC
180.0000 mg | DELAYED_RELEASE_TABLET | Freq: Two times a day (BID) | ORAL | Status: DC
  Administered 2022-05-05 – 2022-05-09 (×9): 180 mg via ORAL
  Filled 2022-05-05 (×11): qty 1

## 2022-05-05 MED ORDER — ATORVASTATIN CALCIUM 10 MG PO TABS
20.0000 mg | ORAL_TABLET | Freq: Every day | ORAL | Status: DC
  Administered 2022-05-05 – 2022-05-09 (×5): 20 mg via ORAL
  Filled 2022-05-05 (×6): qty 2

## 2022-05-05 MED ORDER — DULOXETINE HCL 30 MG PO CPEP: 30.0000 mg | ORAL_CAPSULE | Freq: Every day | ORAL | Status: DC

## 2022-05-05 MED ORDER — PREDNISONE 5 MG PO TABS
5.0000 mg | ORAL_TABLET | Freq: Every day | ORAL | Status: DC
  Administered 2022-05-05 – 2022-05-09 (×5): 5 mg via ORAL
  Filled 2022-05-05 (×5): qty 1

## 2022-05-05 MED ORDER — FLUOXETINE HCL 20 MG PO CAPS
40.0000 mg | ORAL_CAPSULE | Freq: Every day | ORAL | Status: DC
  Administered 2022-05-05 – 2022-05-09 (×5): 40 mg via ORAL
  Filled 2022-05-05 (×5): qty 2

## 2022-05-05 MED ORDER — ASPIRIN 81 MG PO TBEC
81.0000 mg | DELAYED_RELEASE_TABLET | Freq: Every day | ORAL | Status: DC
  Administered 2022-05-05 – 2022-05-09 (×5): 81 mg via ORAL
  Filled 2022-05-05 (×5): qty 1

## 2022-05-05 MED ORDER — CARVEDILOL 3.125 MG PO TABS
3.1250 mg | ORAL_TABLET | Freq: Two times a day (BID) | ORAL | Status: DC
  Administered 2022-05-05 – 2022-05-09 (×9): 3.125 mg via ORAL
  Filled 2022-05-05 (×9): qty 1

## 2022-05-05 MED ORDER — OXYCODONE HCL 5 MG PO TABS: 5.0000 mg | ORAL_TABLET | Freq: Four times a day (QID) | ORAL | Status: DC | PRN

## 2022-05-05 MED ORDER — CALCITONIN (SALMON) 200 UNIT/ACT NA SOLN
1.0000 | Freq: Every day | NASAL | Status: DC
  Administered 2022-05-06 – 2022-05-09 (×4): 1 via NASAL
  Filled 2022-05-05 (×2): qty 3.7

## 2022-05-05 NOTE — H&P (Signed)
History and Physical    Patient: [name] SJG:[id number] DOB: [birth year] DOA: 05/05/2022 DOS: the patient was seen and examined on 05/05/2022 PCP: Pcp, No  Patient coming from: Home  Chief Complaint: No chief complaint on file.  HPI: [name] is a 76 y.o. female with medical history significant of recent CVA, atrial fibrillation, history of GI bleed status post Watchman procedure with a pacemaker in place, chronic kidney disease stage V, history of a flutter, essential hypertension, heart failure with ejection fraction, end-stage renal disease status post renal transplant, history of DVT and PE who was taken up Eliquis earlier this year as a result of GI bleed presenting with right-sided weakness.  Patient has had recent CVA involving the right side.  She is also has weakness on the left side.  She apparently has been having progressive decline in her system over the last 2 weeks.  Also increased forgetfulness.  She feels that the right side is weaker than the left.  Her last stroke was in January also on the right side.  She recovered about 75% apparently.  Today in the ER code stroke was called.  Neuro has seen patient and recommends repeat MRI.  Thinking is this could be her previous CVA involving or new 1.  Patient however has a pacemaker in place so MRI is not feasible tonight.  The earliest she can have it done is Monday.  We will admit patient for further CVA work-up.  Review of Systems: As mentioned in the history of present illness. All other systems reviewed and are negative. Past Medical History:  Diagnosis Date   Acute diastolic (congestive) heart failure (HCC) 08/12/2017   Acute GI bleeding 11/14/2019   Acute kidney injury superimposed on chronic kidney disease ([place]) 11/17/2017   AKI (acute kidney injury) ([place]) 11/04/2017   Anemia    Anxiety    ARF (acute renal failure) ([place]) 06/06/2011   Arthritis    "shoulders; arms; hips" (02/04/2018)   Asthma    Atrial fibrillation  (HCC)    Atrial flutter ([place])    s/p aflutter ablation at [facility]   Bacteriuria, asymptomatic 11/14/2020   Benign hypertension with ESRD (end-stage renal disease) ([place])    Blood transfusion    never had a reaction to blood transfusion   Cardiomyopathy Mar 2013   Mild, EF 50-55% by Mar 2013 ECHO, diast dysfxn II   Cardiomyopathy ([place])    CHF (congestive heart failure) ([place]) 2005   CHF (congestive heart failure) ([place])    Childhood asthma    Closed fracture of right distal radius 10/18/2016   CMV (cytomegalovirus infection) ([place]) 10/03/2015   Constipation    takes Miralax daily   Constipation    Depression    takes Zoloft daily   Distal radius fracture, right    DVT of lower extremity, bilateral ([place]) 12/21/11   "they're there now; been there for 2 wks"   Eczema    End stage renal disease ([place]) 11/06/2011   ESRD (end stage renal disease) on dialysis [facility]) 09/2011   07-19-2015 had Kidney transplant at [facility]; "don't get dialysis anymore" (02/04/2018)   Essential hypertension 07/07/2007   Qualifier: Diagnosis of  By: Garen Grams     Fractures, stress    in both feet--6 OR 7 YRS AGO--RESOLVED   Generalized edema [id number]   GI bleed 11/14/2019   Gout    doesn't require meds    HCAP (healthcare-associated pneumonia) 09/10/2011   Hearing difficulty    Hematoma of  left lower leg 05/17/2020   High cholesterol    History of CVA (cerebrovascular accident) without residual deficits    History of hip replacement, total, right    History of pneumonia    Hx of Clostridium difficile infection    Hypercalcemia    09/10/11   Hyperparathyroidism ([place]) 04/07/2013   Hypertension    takes Diltiazem daily    Hypomagnesemia 08/02/2015   Hypophosphatemia 11/08/2017   ICB (intracranial bleed) ([place])    Immunosuppression ([place]) 07/25/2015   Memory changes    Morbid obesity ([place])    Nonischemic dilated cardiomyopathy ([place])    Obesity 04/07/2013   Oligouria    Oral mucositis 02/22/2018   OSA on CPAP     PAF (paroxysmal atrial fibrillation) (HCC)     HX OF CEREBRAL BLEED WHILE ON COUMADIN-SO PT NOT ON ANY BLOOD THINNERS NOW   Peripheral vascular disease ([place])    Persistent atrial fibrillation ([place]) 08/12/2017   Overview:  Added automatically from request for surgery [id number]  Formatting of this note might be different from the original. Added automatically from request for surgery [id number]   Presence of Watchman left atrial appendage closure device 10/04/2017   Proteinuria 09/04/2016   Psoriasis 08/07/2016   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/25/2015   S/P insertion of IVC (inferior vena caval) filter    Sepsis ([place]) 11/08/2017   Stress fracture    bilateral feet   Stroke ([place]) 2009   denies residual on 02/04/2018;  hemorrhagic now off coumadin   Stroke ([place]) 07/17/2007   denies residual, hemorrhagic now off coumadin   Stroke due to intracerebral hemorrhage ([place]) 2009   Suture reaction 09/04/2016   Transfusion history    Use of cane as ambulatory aid    Past Surgical History:  Procedure Laterality Date   AV FISTULA PLACEMENT  09/28/2011   Procedure: ARTERIOVENOUS (AV) FISTULA CREATION;  Surgeon: Rosetta Posner, MD;  Location: [place];  Service: Vascular;  Laterality: Left;   BIOPSY  07/10/2021   Procedure: BIOPSY;  Surgeon: Irving Copas., MD;  Location[facility];  Service: Gastroenterology;;   CARDIAC CATHETERIZATION     CARDIAC VALVE [place] DEFECT   CHOLECYSTECTOMY     1980's   COLONOSCOPY     COLONOSCOPY WITH PROPOFOL N/A 11/16/2019   Procedure: COLONOSCOPY WITH PROPOFOL;  Surgeon: Juanita Craver, MD;  Location: [facility] ENDOSCOPY;  Service: Endoscopy;  Laterality: N/A;   CYSTOSCOPY     many yrs ago   ESOPHAGOGASTRODUODENOSCOPY N/A 07/10/2021   Procedure: ESOPHAGOGASTRODUODENOSCOPY (EGD);  Surgeon: Irving Copas., MD;  Location: [facility];  Service: Gastroenterology;  Laterality: N/A;   ESOPHAGOGASTRODUODENOSCOPY (EGD) WITH PROPOFOL N/A 11/16/2019    Procedure: ESOPHAGOGASTRODUODENOSCOPY (EGD) WITH PROPOFOL;  Surgeon: Juanita Craver, MD;  Location[facility]COPY;  Service: Endoscopy;  Laterality: N/A;   ESOPHAGOGASTRODUODENOSCOPY (EGD) WITH PROPOFOL N/A 03/20/2021   Procedure: ESOPHAGOGASTRODUODENOSCOPY (EGD) WITH PROPOFOL;  Surgeon: Milus Banister, MD;  Location: [facility] ENDOSCOPY;  Service: Endoscopy;  Laterality: N/A;   FEMUR FRACTURE SURGERY Right 03/2011; 09/03/2011   "had 2, 2 wk apart in 2012; broke it again 08/2011 & had OR"   [place] N/A 07/10/2021   Procedure: FLEXIBLE SIGMOIDOSCOPY;  Surgeon: Rush Landmark Telford Nab., MD;  Location: [facility];  Service: Gastroenterology;  Laterality: N/A;   FRACTURE SURGERY     GIVENS CAPSULE STUDY N/A 07/11/2021   Procedure: GIVENS CAPSULE STUDY;  Surgeon: Juanita Craver, MD;  Location: [facility] ENDOSCOPY;  Service: Endoscopy;  Laterality: N/A;   HEMOSTASIS CONTROL  03/20/2021   Procedure: HEMOSTASIS CONTROL;  Surgeon: Milus Banister, MD;  Location: [place];  Service: Endoscopy;;   HOT HEMOSTASIS N/A 03/20/2021   Procedure: HOT HEMOSTASIS (ARGON PLASMA COAGULATION/BICAP);  Surgeon: Milus Banister, MD;  Location: [facility] ENDOSCOPY;  Service: Endoscopy;  Laterality: N/A;   HOT HEMOSTASIS N/A 07/10/2021   Procedure: HOT HEMOSTASIS (ARGON PLASMA COAGULATION/BICAP);  Surgeon: Irving Copas., MD;  Location: [facility];  Service: Gastroenterology;  Laterality: N/A;   I & D EXTREMITY  09/15/2011   Procedure: IRRIGATION AND DEBRIDEMENT EXTREMITY w REMOVAL OF HARDWARE;  Surgeon: [name], MD;  Location: [place];  Service: Orthopedics;  Laterality: Right;   INCISION AND DRAINAGE HIP  12/21/2011   Procedure: IRRIGATION AND DEBRIDEMENT HIP;  Surgeon: [name], MD;  Location: [place];  Service: Orthopedics;  Laterality: Right;  I&D RIGHT HIP WITH PLACEMENT ANTIBIOTIC SPACER   INSERTION OF DIALYSIS CATHETER     Procedure: INSERTION OF DIALYSIS CATHETER;  Surgeon: Rosetta Posner, MD;  Location: [place];   Service: Vascular;  Laterality: Right;   IRRIGATION AND DEBRIDEMENT ABSCESS  12/21/11   right hip   JOINT REPLACEMENT     KIDNEY TRANSPLANT  07/19/15   LAPAROSCOPIC GASTRIC BANDING  2008   LEFT ATRIAL APPENDAGE OCCLUSION  10/04/2017   OPEN REDUCTION INTERNAL FIXATION (ORIF) DISTAL RADIAL FRACTURE Right 10/18/2016   Procedure: OPEN REDUCTION INTERNAL FIXATION (ORIF) RIGHT DISTAL RADIAL FRACTURE;  Surgeon: Marchia Bond, MD;  Location: [place];  Service: Orthopedics;  Laterality: Right;   SUBMUCOSAL TATTOO INJECTION  07/10/2021   Procedure: SUBMUCOSAL TATTOO INJECTION;  Surgeon: Irving Copas., MD;  Location: [facility];  Service: Gastroenterology;;   TOTAL HIP ARTHROPLASTY Right 02/2011   right THA 02/2011, I&D/removal of hardware 09/2011,, repeat I&D Jun 2013, reimplantation R THA 03-26-2012   TOTAL HIP REVISION  03/25/2012   Procedure: TOTAL HIP REVISION;  Surgeon: [name], MD;  Location: [facility];  Service: Orthopedics;  Laterality: Right;  Right Total Hip Reimplantation   TUBAL LIGATION     VENA CAVA FILTER PLACEMENT  11/2011   Social History:  reports that she has never smoked. She has never used smokeless tobacco. She reports that she does not drink alcohol and does not use drugs.  Allergies  Allergen Reactions   Warfarin Sodium Other (See Comments)    Caused her to have a stroke/left side of brain to hemorrhage    Ace Inhibitors Cough   Amiodarone Other (See Comments)    Pt with Restrictive Lung Disease by 10/2017 PFT with decreased DLCO   Amlodipine Swelling    Swelling in feet    Family History  Problem Relation Age of Onset   Cancer Father    Diabetes Mother    Hypertension Mother    Arthritis Mother    Hodgkin's lymphoma Other 37       dscd---HODGKINS DISEASE   Hypertension Brother     Prior to Admission medications   Medication Sig Start Date End Date Taking? Authorizing Provider  acetaminophen (TYLENOL) 500 MG tablet Take 500 mg by mouth  every 6 (six) hours as needed for mild pain or headache.     [provider]  albuterol (PROVENTIL) (2.5 MG/3ML) 0.083% nebulizer solution Take 3 mLs (2.5 mg total) by nebulization every 6 (six) hours as needed for wheezing or shortness of breath. Dx:J45.909 02/09/22   [name], [name], NP  aspirin EC 81  MG tablet Take 81 mg by mouth daily. Swallow whole.    [provider]  atorvastatin (LIPITOR) 20 MG tablet TAKE 1 TABLET BY MOUTH EVERY DAY 05/03/22   [name], [name], NP  budesonide-formoterol (SYMBICORT) 160-4.5 MCG/ACT inhaler Inhale 2 puffs into the lungs 2 (two) times daily. 06/13/21   [name], MD  calcitonin, salmon, (MIACALCIN) 200 UNIT/ACT nasal spray Place 1 spray into alternate nostrils daily. 04/26/22   Rosemarie Ax, MD  carvedilol (COREG) 3.125 MG tablet Take 1 tablet (3.125 mg total) by mouth 2 (two) times daily with a meal. 11/14/21   [name], [name], NP  docusate sodium (COLACE) 100 MG capsule Take 100 mg by mouth daily.    [provider]  DULoxetine (CYMBALTA) 30 MG capsule TAKE 1 CAPSULE BY MOUTH EVERY DAY 05/03/22   [name], [name], NP  FLUoxetine (PROZAC) 20 MG capsule 2 qam 04/18/22   Plovsky, Berneta Sages, MD  furosemide (LASIX) 80 MG tablet Take 80 mg by mouth 3 (three) times a week.    [provider]  lactulose (CHRONULAC) 10 GM/15ML solution Take 30 mLs (20 g total) by mouth daily as needed for mild constipation or severe constipation. 11/14/21   [name], [name], NP  mycophenolate (MYFORTIC) 180 MG EC tablet Take 180 mg by mouth 2 (two) times daily.    [provider]  ondansetron (ZOFRAN) 4 MG tablet Take 1 tablet (4 mg total) by mouth every 8 (eight) hours as needed for up to 9 doses for nausea. 03/25/21   [name], MD  oxyCODONE (OXY IR/ROXICODONE) 5 MG immediate release tablet Take 1 tablet (5 mg total) by mouth every 6 (six) hours as needed for severe pain. 01/20/22   Florencia Reasons, MD  pantoprazole  (PROTONIX) 20 MG tablet TAKE 1 TABLET BY MOUTH EVERY DAY 05/03/22   [name], [name], NP  polyethylene glycol (MIRALAX / GLYCOLAX) 17 g packet Take 17 g by mouth daily. Hold if diarrhea 01/20/22   Florencia Reasons, MD  potassium chloride (MICRO-K) 10 MEQ CR capsule Take 10 mEq by mouth 3 (three) times a week.    [provider]  predniSONE (DELTASONE) 5 MG tablet Take 5 mg by mouth daily. 02/21/21   [provider]  tacrolimus (PROGRAF) 1 MG capsule Take 1 mg by mouth 2 (two) times daily.    [provider]    Physical Exam: Vitals:   05/05/22 1830 05/05/22 1900 05/05/22 1930 05/05/22 2100  BP: (!) 150/91 (!) 149/86 (!) 149/89 (!) 149/90  Pulse: 71 73 73 70  Resp: (!) 21 (!) 28 (!) 31 (!) 31  Temp:      TempSrc:      SpO2: 94% 95% 96% 96%   Constitutional: Chronically ill looking, cachectic, NAD, calm, comfortable Eyes: PERRL, lids and conjunctivae normal ENMT: Mucous membranes are moist. Posterior pharynx clear of any exudate or lesions.Normal dentition.  Neck: normal, supple, no masses, no thyromegaly Respiratory: clear to auscultation bilaterally, no wheezing, no crackles. Normal respiratory effort. No accessory muscle use.  Cardiovascular: Regular rate and rhythm, no murmurs / rubs / gallops. No extremity edema. 2+ pedal pulses. No carotid bruits.  Abdomen: no tenderness, no masses palpated. No hepatosplenomegaly. Bowel sounds positive.  Musculoskeletal: Good range of motion, no joint swelling or tenderness, Skin: no rashes, lesions, ulcers. No induration Neurologic: Awake and alert, unable to move both lower extremities, slight right upper extremity weakness at 3 out of 5 Psychiatric: Normal judgment and  insight.  Confused, normal mood  Data Reviewed:  BUN is 48 creatinine 3.41, glucose 111.  Albumin 2.9.  CBC within normal.  PT 15.9 INR 1.3.  Urinalysis showed large leukocytes negative nitrite.  Many bacteria.  WBC more than 50 CT head without contrast negative  for acute.  Assessment and Plan:  #1 CVA versus TIA: Patient already on aspirin and statin.  Also PT OT.  Suspicion for ongoing CVA.  She will need pacemaker compatible MRI to be done on Monday.  If negative patient will likely discontinue on her current regimen with positive neurology will follow with further recommendations.  #2 possible TIA: Urinalysis suspicious for CVA.  There has been question of confusion lately.  Rocephin initiated in the ER we will continue.  #3 end-stage renal disease: Status post renal transplant.  Continue tacrolimus and other home regimen  #4.  Chronic systolic dysfunction: Appears compensated.  Continue home regimen  #5 atrial fibrillation: Status post AVN ablation, Watchman procedure as well as pacemaker placement.  Continue to treatment.  #6 GERD: Continue PPIs    Advance Care Planning:   Code Status: Prior full code  Consults: Dr. Leonel Ramsay, neurology  Family Communication: Husband  Severity of Illness: The appropriate patient status for this patient is OBSERVATION. Observation status is judged to be reasonable and necessary in order to provide the required intensity of service to ensure the patient's safety. The patient's presenting symptoms, physical exam findings, and initial radiographic and laboratory data in the context of their medical condition is felt to place them at decreased risk for further clinical deterioration. Furthermore, it is anticipated that the patient will be medically stable for discharge from the hospital within 2 midnights of admission.   AuthorBarbette Merino, MD 05/05/2022 9:37 PM  For on call review [email or web address].

## 2022-05-05 NOTE — ED Provider Triage Note (Signed)
Emergency Medicine Provider Triage Evaluation Note  [name] , a 76 y.o. female  was evaluated in triage.  Pt complains of stroke like symptoms. Per EMS, family at home last night state patient had acute weakness. States that this resolved this morning but ongoing concern. Patient has no complaints. Denies headache, changes to vision or feeling weak. Denies numbness or tingling to face, arm or leg  Review of Systems  Positive: See above Negative:   Physical Exam  BP (!) 144/83   Pulse 70   Temp 97.9 F (36.6 C) (Oral)   Resp 16   SpO2 99%  Gen:   Awake, no distress, alert and oriented. Resp:  Normal effort  MSK:   Moves extremities without difficulty  Other:  When face is at rest, appears to have drooping of the right mouth. When smiling/blowing out cheeks - symmetric. RLE weakness which apparently is baseline from previous stroke. No pronator drift. No sensation changes.   Medical Decision Making  Medically screening exam initiated at 10:46 AM.  Appropriate orders placed.  ANITRA DOXTATER was informed that the remainder of the evaluation will be completed by another provider, this initial triage assessment does not replace that evaluation, and the importance of remaining in the ED until their evaluation is complete.  Outside TNK window, LVO negative. Will order stroke work up    Mickie Hillier, PA-C 05/05/22 1046

## 2022-05-05 NOTE — ED Provider Notes (Addendum)
[facility] EMERGENCY DEPARTMENT Provider Note   CSN: [id number] Arrival date & time: 05/05/22  1019     History  No chief complaint on file.   [name] is a 76 y.o. female.  HPI     76 y.o. female with PMHx significant of HFpEF (EF 55-60% at OSH 10/2019), ESRD 2/2 HTN s/p renal transplant (07/19/2015, on myfortic and tacrolimus), DVT/PE s/p IVC filter, A-fib, GI bleed taken off of Eliquis earlier this year comes in with chief complaint of right-sided weakness.  Patient accompanied by her husband.  Her husband indicates that patient started declining about 2 weeks ago.  He noted that she was having increased forgetfulness with names of family members.  He also noted that patient was weaker than usual and was requiring more assistance moving around.  Patient admits to feeling that the right side is weaker than her baseline normal.  She had a stroke in January leading to right-sided weakness.  Patient states that she had recovered about 75% of her strength through rehab.  No recent illnesses.  No nausea, vomiting, fevers, chills.  Home Medications Prior to Admission medications   Medication Sig Start Date End Date Taking? Authorizing Provider  acetaminophen (TYLENOL) 500 MG tablet Take 500 mg by mouth every 6 (six) hours as needed for mild pain or headache.     [provider]  albuterol (PROVENTIL) (2.5 MG/3ML) 0.083% nebulizer solution Take 3 mLs (2.5 mg total) by nebulization every 6 (six) hours as needed for wheezing or shortness of breath. Dx:J45.909 02/09/22   [name], [name], NP  aspirin EC 81 MG tablet Take 81 mg by mouth daily. Swallow whole.    [provider]  atorvastatin (LIPITOR) 20 MG tablet TAKE 1 TABLET BY MOUTH EVERY DAY 05/03/22   [name], [name], NP  budesonide-formoterol (SYMBICORT) 160-4.5 MCG/ACT inhaler Inhale 2 puffs into the lungs 2 (two) times daily. 06/13/21   [name], MD  calcitonin, salmon, (MIACALCIN)  200 UNIT/ACT nasal spray Place 1 spray into alternate nostrils daily. 04/26/22   Rosemarie Ax, MD  carvedilol (COREG) 3.125 MG tablet Take 1 tablet (3.125 mg total) by mouth 2 (two) times daily with a meal. 11/14/21   [name], [name], NP  docusate sodium (COLACE) 100 MG capsule Take 100 mg by mouth daily.    [provider]  DULoxetine (CYMBALTA) 30 MG capsule TAKE 1 CAPSULE BY MOUTH EVERY DAY 05/03/22   [name], [name], NP  FLUoxetine (PROZAC) 20 MG capsule 2 qam 04/18/22   Plovsky, Berneta Sages, MD  furosemide (LASIX) 80 MG tablet Take 80 mg by mouth 3 (three) times a week.    [provider]  lactulose (CHRONULAC) 10 GM/15ML solution Take 30 mLs (20 g total) by mouth daily as needed for mild constipation or severe constipation. 11/14/21   [name], [name], NP  mycophenolate (MYFORTIC) 180 MG EC tablet Take 180 mg by mouth 2 (two) times daily.    [provider]  ondansetron (ZOFRAN) 4 MG tablet Take 1 tablet (4 mg total) by mouth every 8 (eight) hours as needed for up to 9 doses for nausea. 03/25/21   [name], MD  oxyCODONE (OXY IR/ROXICODONE) 5 MG immediate release tablet Take 1 tablet (5 mg total) by mouth every 6 (six) hours as needed for severe pain. 01/20/22   Florencia Reasons, MD  pantoprazole (PROTONIX) 20 MG tablet TAKE 1 TABLET BY MOUTH EVERY DAY 05/03/22   [name], Baldo Ash  Lum, NP  polyethylene glycol (MIRALAX / GLYCOLAX) 17 g packet Take 17 g by mouth daily. Hold if diarrhea 01/20/22   Florencia Reasons, MD  potassium chloride (MICRO-K) 10 MEQ CR capsule Take 10 mEq by mouth 3 (three) times a week.    [provider]  predniSONE (DELTASONE) 5 MG tablet Take 5 mg by mouth daily. 02/21/21   [provider]  tacrolimus (PROGRAF) 1 MG capsule Take 1 mg by mouth 2 (two) times daily.    [provider]      Allergies    Warfarin sodium, Ace inhibitors, Amiodarone, and Amlodipine    Review of Systems   Review of Systems  All other systems  reviewed and are negative.   Physical Exam Updated Vital Signs BP (!) 149/90   Pulse 70   Temp (!) 97.3 F (36.3 C) (Oral)   Resp (!) 31   SpO2 96%  Physical Exam Vitals and nursing note reviewed.  Constitutional:      Appearance: She is well-developed.  HENT:     Head: Atraumatic.  Eyes:     Extraocular Movements: Extraocular movements intact.     Pupils: Pupils are equal, round, and reactive to light.  Cardiovascular:     Rate and Rhythm: Normal rate.  Pulmonary:     Effort: Pulmonary effort is normal.  Musculoskeletal:     Cervical back: Normal range of motion and neck supple.  Skin:    General: Skin is warm and dry.  Neurological:     Mental Status: She is alert and oriented to person, place, and time.     Cranial Nerves: No cranial nerve deficit.     Sensory: Sensory deficit present.     Motor: Weakness present.     Coordination: Coordination normal.     Comments: Patient has subjective numbness to the right side of the face. 4+ out of 5 upper extremity strength. 3+ out of 5 lower extremity strength bilaterally Patient subjectively states that the right side feels weaker than left No nystagmus, dysarthria     ED Results / Procedures / Treatments   Labs (all labs ordered are listed, but only abnormal results are displayed) Labs Reviewed  COMPREHENSIVE METABOLIC PANEL - Abnormal; Notable for the following components:      Result Value   Glucose, Bld 111 (*)    BUN 48 (*)    Creatinine, Ser 3.41 (*)    Total Protein 5.9 (*)    Albumin 2.9 (*)    GFR, Estimated 14 (*)    All other components within normal limits  CBC WITH DIFFERENTIAL/PLATELET - Abnormal; Notable for the following components:   MCHC 29.8 (*)    RDW 18.0 (*)    Platelets 126 (*)    All other components within normal limits  PROTIME-INR - Abnormal; Notable for the following components:   Prothrombin Time 15.9 (*)    INR 1.3 (*)    All other components within normal limits  URINALYSIS,  ROUTINE W REFLEX MICROSCOPIC - Abnormal; Notable for the following components:   Color, Urine AMBER (*)    APPearance CLOUDY (*)    Glucose, UA 50 (*)    Protein, ur >=300 (*)    Leukocytes,Ua LARGE (*)    WBC, UA >50 (*)    Bacteria, UA MANY (*)    All other components within normal limits  CBG MONITORING, ED - Abnormal; Notable for the following components:   Glucose-Capillary 103 (*)    All other  components within normal limits  APTT  ETHANOL  RAPID URINE DRUG SCREEN, HOSP PERFORMED    EKG EKG Interpretation  Date/Time:  Saturday May 05 2022 10:44:42 EDT Ventricular Rate:  71 PR Interval:    QRS Duration: 158 QT Interval:  424 QTC Calculation: 460 R Axis:   -76 Text Interpretation: Ventricular-paced rhythm Abnormal ECG When compared with ECG of 23-Jan-2022 15:58, PREVIOUS ECG IS PRESENT No significant change since last tracing Confirmed by Varney Biles [phone]) on 05/05/2022 5:45:35 PM  Radiology CT Head Wo Contrast  Result Date: 05/05/2022 CLINICAL DATA:  Neuro deficit, acute, stroke suspected EXAM: CT HEAD WITHOUT CONTRAST TECHNIQUE: Contiguous axial images were obtained from the base of the skull through the vertex without intravenous contrast. RADIATION DOSE REDUCTION: This exam was performed according to the departmental dose-optimization program which includes automated exposure control, adjustment of the mA and/or kV according to patient size and/or use of iterative reconstruction technique. COMPARISON:  07/23/2021 and previous FINDINGS: Brain: Diffuse parenchymal atrophy. Patchy areas of hypoattenuation in deep and periventricular white matter bilaterally. Negative for acute intracranial hemorrhage, mass lesion, acute infarction, midline shift, or mass-effect. Acute infarct may be inapparent on noncontrast CT. Ventricles and sulci symmetric. Vascular: Atherosclerotic and physiologic intracranial calcifications. Skull: Normal. Negative for fracture or focal lesion.  Sinuses/Orbits: No acute finding. Other: None IMPRESSION: 1. Negative for bleed or other acute intracranial process. 2. Atrophy and nonspecific white matter changes. Electronically Signed   By: Lucrezia Europe M.D.   On: 05/05/2022 11:15    Procedures .Critical Care  Performed by: Varney Biles, MD Authorized by: Varney Biles, MD   Critical care provider statement:    Critical care time (minutes):  36   Critical care was necessary to treat or prevent imminent or life-threatening deterioration of the following conditions:  CNS failure or compromise   Critical care was time spent personally by me on the following activities:  Development of treatment plan with patient or surrogate, discussions with consultants, evaluation of patient's response to treatment, examination of patient, ordering and review of laboratory studies, ordering and review of radiographic studies, ordering and performing treatments and interventions, pulse oximetry, re-evaluation of patient's condition and review of old charts     Medications Ordered in ED Medications  aspirin EC tablet 81 mg (81 mg Oral Given 05/05/22 1703)  atorvastatin (LIPITOR) tablet 20 mg (20 mg Oral Given 05/05/22 1704)  calcitonin (salmon) (MIACALCIN/FORTICAL) nasal spray 1 spray (has no administration in time range)  carvedilol (COREG) tablet 3.125 mg (3.125 mg Oral Given 05/05/22 1708)  FLUoxetine (PROZAC) capsule 40 mg (40 mg Oral Given 05/05/22 1703)  furosemide (LASIX) tablet 80 mg (has no administration in time range)  mycophenolate (MYFORTIC) EC tablet 180 mg (has no administration in time range)  oxyCODONE (Oxy IR/ROXICODONE) immediate release tablet 5 mg (has no administration in time range)  pantoprazole (PROTONIX) EC tablet 20 mg (20 mg Oral Given 05/05/22 1704)  tacrolimus (PROGRAF) capsule 1 mg (has no administration in time range)  predniSONE (DELTASONE) tablet 5 mg (5 mg Oral Given 05/05/22 1703)  cefTRIAXone (ROCEPHIN) 1 g in  sodium chloride 0.9 % 100 mL IVPB (has no administration in time range)    ED Course/ Medical Decision Making/ A&P Clinical Course as of 05/05/22 2120  Sat May 05, 2022  2107 MRI team stated that she cannot get MRI until Monday given her pacemaker.  Patient will be admitted to the hospital.  Family made aware.  Neurology made aware. [AN]  2119  Urinalysis, Routine w reflex microscopic(!) UA has pyuria, bacteriuria. Given that there is a possibility of reactivation of stroke, UTI is possible.  We will give her a dose of ceftriaxone. [AN]    Clinical Course User Index [AN] Varney Biles, MD                           Medical Decision Making Amount and/or Complexity of Data Reviewed Radiology: ordered.  Risk OTC drugs. Prescription drug management. Decision regarding hospitalization.   This patient presents to the ED with chief complaint(s) of right-sided weakness, increased forgetfulness with pertinent past medical history of stroke with right-sided symptoms earlier in January, CAD, A-fib, CKD, transplant of the kidney.The complaint involves an extensive differential diagnosis and also carries with it a high risk of complications and morbidity.    The differential diagnosis includes : Severe electrolyte abnormality, medication side effects, stroke reactivation because of external stressors, acute ischemic stroke, acute brain bleed.  Patient had basic blood work-up completed, it is overall reassuring.  CT scan of the brain is negative for any acute findings.  I have interpreted the CT scan of the brain independently, no brain bleed.  Neurology service was consulted.  Patient's symptoms have been present for 2 weeks, it clinically appears to me that she had acute stroke.  Discussed with him the utility of getting MRI.  Dr. Leonel Ramsay said that patient could have had reactivation of the stroke, and that it would be helpful to get an MRI to get proper morphology.   Additional history  obtained: Additional history obtained from family and spouse Records reviewed previous admission documents  Independent labs interpretation:  The following labs were independently interpreted: Creatinine is at baseline elevation, no anion gap, CBC is reassuring.  Electrolytes are reassuring.   Treatment and Reassessment: The medical plan has been discussed with the patient. Husband and the patient indicates that if she has stroke, they are comfortable with outpatient management.  They think that she will need further home health.  Home health order has been placed.  -Home meds ordered -MRI brain ordered If MRI is positive.  We will discuss the case with neurology.  Patient is amenable for outpatient work-up, as she has had symptoms now for 2 weeks.  She was taken off of Eliquis, so there needs to be discussion whether patient needs to be restarted on Eliquis or not with the neurologist. Patient can still be discharged as long as neurology clears.  If MRI is negative, patient can be discharged, but we will have to order home health.  Specifically patient will need home nursing and home aide, social work.  She already has PT-OT.  Final Clinical Impression(s) / ED Diagnoses Final diagnoses:  Stroke-like symptom  Acute right-sided weakness  Acute cystitis with hematuria    Rx / DC Orders ED Discharge Orders     None          Varney Biles, MD 05/05/22 2107    Varney Biles, MD 05/05/22 2120

## 2022-05-05 NOTE — ED Triage Notes (Signed)
Patient arrived by [facility] from home with complaint of acute weakness last night that resolved. Patient uses cane at home due to previous stroke. Patient alert and oriented. Per EMS patient with dementia. MAE with residual weakness to right leg from previous stroke

## 2022-05-06 ENCOUNTER — Other Ambulatory Visit: Payer: Self-pay | Admitting: Nurse Practitioner

## 2022-05-06 DIAGNOSIS — R531 Weakness: Secondary | ICD-10-CM

## 2022-05-06 DIAGNOSIS — N185 Chronic kidney disease, stage 5: Secondary | ICD-10-CM

## 2022-05-06 DIAGNOSIS — I482 Chronic atrial fibrillation, unspecified: Secondary | ICD-10-CM | POA: Diagnosis not present

## 2022-05-06 DIAGNOSIS — Z940 Kidney transplant status: Secondary | ICD-10-CM | POA: Diagnosis not present

## 2022-05-06 DIAGNOSIS — K219 Gastro-esophageal reflux disease without esophagitis: Secondary | ICD-10-CM | POA: Diagnosis present

## 2022-05-06 DIAGNOSIS — J45909 Unspecified asthma, uncomplicated: Secondary | ICD-10-CM | POA: Diagnosis present

## 2022-05-06 DIAGNOSIS — J320 Chronic maxillary sinusitis: Secondary | ICD-10-CM | POA: Diagnosis not present

## 2022-05-06 DIAGNOSIS — F32A Depression, unspecified: Secondary | ICD-10-CM | POA: Diagnosis present

## 2022-05-06 DIAGNOSIS — R64 Cachexia: Secondary | ICD-10-CM | POA: Diagnosis present

## 2022-05-06 DIAGNOSIS — N3001 Acute cystitis with hematuria: Secondary | ICD-10-CM | POA: Diagnosis not present

## 2022-05-06 DIAGNOSIS — I639 Cerebral infarction, unspecified: Secondary | ICD-10-CM | POA: Diagnosis present

## 2022-05-06 DIAGNOSIS — I480 Paroxysmal atrial fibrillation: Secondary | ICD-10-CM | POA: Diagnosis not present

## 2022-05-06 DIAGNOSIS — L89152 Pressure ulcer of sacral region, stage 2: Secondary | ICD-10-CM | POA: Diagnosis present

## 2022-05-06 DIAGNOSIS — I459 Conduction disorder, unspecified: Secondary | ICD-10-CM | POA: Diagnosis present

## 2022-05-06 DIAGNOSIS — F0393 Unspecified dementia, unspecified severity, with mood disturbance: Secondary | ICD-10-CM | POA: Diagnosis present

## 2022-05-06 DIAGNOSIS — I132 Hypertensive heart and chronic kidney disease with heart failure and with stage 5 chronic kidney disease, or end stage renal disease: Secondary | ICD-10-CM | POA: Diagnosis not present

## 2022-05-06 DIAGNOSIS — N390 Urinary tract infection, site not specified: Secondary | ICD-10-CM | POA: Diagnosis present

## 2022-05-06 DIAGNOSIS — F29 Unspecified psychosis not due to a substance or known physiological condition: Secondary | ICD-10-CM | POA: Diagnosis not present

## 2022-05-06 DIAGNOSIS — I2510 Atherosclerotic heart disease of native coronary artery without angina pectoris: Secondary | ICD-10-CM | POA: Diagnosis present

## 2022-05-06 DIAGNOSIS — G9389 Other specified disorders of brain: Secondary | ICD-10-CM | POA: Diagnosis not present

## 2022-05-06 DIAGNOSIS — Z86718 Personal history of other venous thrombosis and embolism: Secondary | ICD-10-CM | POA: Diagnosis not present

## 2022-05-06 DIAGNOSIS — E46 Unspecified protein-calorie malnutrition: Secondary | ICD-10-CM | POA: Diagnosis not present

## 2022-05-06 DIAGNOSIS — G459 Transient cerebral ischemic attack, unspecified: Secondary | ICD-10-CM | POA: Diagnosis present

## 2022-05-06 DIAGNOSIS — Z8673 Personal history of transient ischemic attack (TIA), and cerebral infarction without residual deficits: Secondary | ICD-10-CM | POA: Diagnosis not present

## 2022-05-06 DIAGNOSIS — I420 Dilated cardiomyopathy: Secondary | ICD-10-CM | POA: Diagnosis present

## 2022-05-06 DIAGNOSIS — Z681 Body mass index (BMI) 19 or less, adult: Secondary | ICD-10-CM | POA: Diagnosis not present

## 2022-05-06 DIAGNOSIS — E785 Hyperlipidemia, unspecified: Secondary | ICD-10-CM | POA: Diagnosis present

## 2022-05-06 DIAGNOSIS — J45901 Unspecified asthma with (acute) exacerbation: Secondary | ICD-10-CM

## 2022-05-06 DIAGNOSIS — I4819 Other persistent atrial fibrillation: Secondary | ICD-10-CM | POA: Diagnosis present

## 2022-05-06 DIAGNOSIS — Z95828 Presence of other vascular implants and grafts: Secondary | ICD-10-CM | POA: Diagnosis not present

## 2022-05-06 DIAGNOSIS — E440 Moderate protein-calorie malnutrition: Secondary | ICD-10-CM | POA: Diagnosis present

## 2022-05-06 DIAGNOSIS — Z7401 Bed confinement status: Secondary | ICD-10-CM | POA: Diagnosis not present

## 2022-05-06 DIAGNOSIS — I1311 Hypertensive heart and chronic kidney disease without heart failure, with stage 5 chronic kidney disease, or end stage renal disease: Secondary | ICD-10-CM | POA: Diagnosis present

## 2022-05-06 DIAGNOSIS — I69351 Hemiplegia and hemiparesis following cerebral infarction affecting right dominant side: Secondary | ICD-10-CM | POA: Diagnosis not present

## 2022-05-06 DIAGNOSIS — Z95818 Presence of other cardiac implants and grafts: Secondary | ICD-10-CM | POA: Diagnosis not present

## 2022-05-06 DIAGNOSIS — I739 Peripheral vascular disease, unspecified: Secondary | ICD-10-CM | POA: Diagnosis present

## 2022-05-06 DIAGNOSIS — E7800 Pure hypercholesterolemia, unspecified: Secondary | ICD-10-CM | POA: Diagnosis present

## 2022-05-06 DIAGNOSIS — I5032 Chronic diastolic (congestive) heart failure: Secondary | ICD-10-CM | POA: Diagnosis present

## 2022-05-06 DIAGNOSIS — R413 Other amnesia: Secondary | ICD-10-CM

## 2022-05-06 DIAGNOSIS — G9341 Metabolic encephalopathy: Secondary | ICD-10-CM | POA: Diagnosis present

## 2022-05-06 DIAGNOSIS — Z950 Presence of cardiac pacemaker: Secondary | ICD-10-CM | POA: Diagnosis not present

## 2022-05-06 DIAGNOSIS — I6930 Unspecified sequelae of cerebral infarction: Secondary | ICD-10-CM

## 2022-05-06 LAB — VITAMIN B12: Vitamin B-12: 1450 pg/mL — ABNORMAL HIGH (ref 180–914)

## 2022-05-06 LAB — CBC
HCT: 43.1 % (ref 36.0–46.0)
Hemoglobin: 13.4 g/dL (ref 12.0–15.0)
MCH: 27.7 pg (ref 26.0–34.0)
MCHC: 31.1 g/dL (ref 30.0–36.0)
MCV: 89 fL (ref 80.0–100.0)
Platelets: 139 10*3/uL — ABNORMAL LOW (ref 150–400)
RBC: 4.84 MIL/uL (ref 3.87–5.11)
RDW: 17.9 % — ABNORMAL HIGH (ref 11.5–15.5)
WBC: 5.8 10*3/uL (ref 4.0–10.5)
nRBC: 0 % (ref 0.0–0.2)

## 2022-05-06 LAB — TSH: TSH: 2.192 u[IU]/mL (ref 0.350–4.500)

## 2022-05-06 LAB — FOLATE: Folate: 10.4 ng/mL (ref >5.9)

## 2022-05-06 LAB — LIPID PANEL
Cholesterol: 99 mg/dL (ref 0–200)
HDL: 47 mg/dL (ref >40)
LDL Cholesterol: 40 mg/dL (ref 0–99)
Total CHOL/HDL Ratio: 2.1 RATIO
Triglycerides: 58 mg/dL (ref <150)
VLDL: 12 mg/dL (ref 0–40)

## 2022-05-06 LAB — HEMOGLOBIN A1C
Hgb A1c MFr Bld: 4.7 % — ABNORMAL LOW (ref 4.8–5.6)
Mean Plasma Glucose: 88.19 mg/dL

## 2022-05-06 LAB — SEDIMENTATION RATE: Sed Rate: 2 mm/hr (ref 0–22)

## 2022-05-06 MED ORDER — STROKE: EARLY STAGES OF RECOVERY BOOK
Freq: Once | Status: AC
  Filled 2022-05-06 (×2): qty 1

## 2022-05-06 MED ORDER — ACETAMINOPHEN 325 MG PO TABS: 650.0000 mg | ORAL_TABLET | ORAL | Status: DC | PRN

## 2022-05-06 MED ORDER — ACETAMINOPHEN 160 MG/5ML PO SOLN: 650.0000 mg | ORAL | Status: DC | PRN

## 2022-05-06 MED ORDER — ACETAMINOPHEN 650 MG RE SUPP: 650.0000 mg | RECTAL | Status: DC | PRN

## 2022-05-06 MED ORDER — SENNOSIDES-DOCUSATE SODIUM 8.6-50 MG PO TABS: 1.0000 | ORAL_TABLET | Freq: Every evening | ORAL | Status: DC | PRN

## 2022-05-06 NOTE — Hospital Course (Signed)
Mrs. Fowers was admitted to the hospital with the working diagnosis of acute CVA   76 yo female with the past medical history of recent CVA, atrial fibrillation and GI bleed sp watchman procedure, who presented with right sided weakness. She is sp renal transplant, has history of DVT and PE but has been off anticoagulation due to GI bleeding. Reported 2 weeks of feeling week, more right than left. On her initial physical examination her blood pressure was 150/91, HR 71 and RR 21, 02 saturation 94%, hear with S1 and S2 present and regular, respiratory with no wheezing, abdomen not distended, right lower extremity edema. Decreased strength 3/5 on the right compared to left.   Head CT negative for acute changes.   Unable to perform MRI due to pacer Plan to repeat head CT non contrast. Patient very weak and deconditioned, plan for SNF when medically stable.

## 2022-05-06 NOTE — Assessment & Plan Note (Signed)
No clinical signs of exacerbation  

## 2022-05-06 NOTE — Assessment & Plan Note (Addendum)
Renal function with serum cr at 3,41 with K at 4,1 and serum bicarbonate at 25.  Sp renal transplant, continue with prednisone, mycophenolate, and tracrolimus   Urinary tract infection, continue antibiotic therapy with ceftriaxone

## 2022-05-06 NOTE — Evaluation (Signed)
Occupational Therapy Evaluation Patient Details Name: [name] MRN: [id number] DOB: 05/10/1946 Today's Date: 05/06/2022   History of Present Illness Pt is a 76 y/o female admitted secondary to increased R weakness and difficulty walking. Awaiting MRI. PMH includes a fib, dementia, HTN, ESRD s/p renal transplant and PE.   Clinical Impression   [name] was evaluated s/p the above admission list, pt reports she is generally indep/mod I at baseline however her husband has been provided increased assist lately due to weakness. Upon evaluation pt had functional limitations due to R weakness, decreased activity tolerance and poor sitting and standing balance. Overall she required mod A +2 for bed mobility, transfers and shuffling steps at the EOB. Due to deficits listed below she also requires max A for LB ADLs and min A for UB ADLs. Pt will benefit from OT acutely. CVA work up still pending, currently recommend SNF at d/c.      Recommendations for follow up therapy are one component of a multi-disciplinary discharge planning process, led by the attending physician.  Recommendations may be updated based on patient status, additional functional criteria and insurance authorization.   Follow Up Recommendations  Skilled nursing-short term rehab (<3 hours/day)    Assistance Recommended at Discharge Frequent or constant Supervision/Assistance  Patient can return home with the following A lot of help with walking and/or transfers;A lot of help with bathing/dressing/bathroom;Assistance with cooking/housework;Assist for transportation;Help with stairs or ramp for entrance;Direct supervision/assist for medications management    Functional Status Assessment  Patient has had a recent decline in their functional status and demonstrates the ability to make significant improvements in function in a reasonable and predictable amount of time.  Equipment Recommendations  None recommended by OT     Recommendations for Other Services       Precautions / Restrictions Precautions Precautions: Fall Restrictions Weight Bearing Restrictions: No      Mobility Bed Mobility Overal bed mobility: Needs Assistance Bed Mobility: Supine to Sit, Sit to Supine     Supine to sit: Mod assist, +2 for safety/equipment Sit to supine: Mod assist, +2 for physical assistance   General bed mobility comments: Assist for trunk and LE throughout.    Transfers Overall transfer level: Needs assistance Equipment used: 2 person hand held assist Transfers: Sit to/from Stand Sit to Stand: Mod assist, +2 physical assistance           General transfer comment: Mod A +2 for lift assist and steadying. Attempted to take side steps, however, pt with heavy R lateral lean and difficulty advancing RLE.      Balance Overall balance assessment: Needs assistance Sitting-balance support: Bilateral upper extremity supported Sitting balance-Leahy Scale: Poor Sitting balance - Comments: Reliant on UE and up to min A for sitting balance Postural control: Right lateral lean Standing balance support: Bilateral upper extremity supported Standing balance-Leahy Scale: Poor Standing balance comment: R lateral lean. Reliant on UE and external support .                           ADL either performed or assessed with clinical judgement   ADL Overall ADL's : Needs assistance/impaired Eating/Feeding: Set up;Sitting   Grooming: Minimal assistance;Sitting   Upper Body Bathing: Minimal assistance;Sitting   Lower Body Bathing: Maximal assistance;Sit to/from stand   Upper Body Dressing : Minimal assistance;Sitting   Lower Body Dressing: Maximal assistance;Sit to/from stand   Toilet Transfer: Moderate assistance;+2 for physical assistance;+2 for safety/equipment;Stand-pivot;BSC/3in1  Toileting- Clothing Manipulation and Hygiene: Maximal assistance;Sit to/from stand       Functional mobility during  ADLs: Moderate assistance;+2 for physical assistance;+2 for safety/equipment General ADL Comments: limited by weakness R>L     Vision Baseline Vision/History: 0 No visual deficits Vision Assessment?: No apparent visual deficits Additional Comments: WFL, woul dbenefit from further assessment     Perception     Praxis      Pertinent Vitals/Pain Pain Assessment Pain Assessment: No/denies pain     Hand Dominance Right   Extremity/Trunk Assessment Upper Extremity Assessment Upper Extremity Assessment: RUE deficits/detail;LUE deficits/detail RUE Deficits / Details: globally 3+/5, reports paraesthesias in hadn and digits RUE Sensation: decreased light touch RUE Coordination: decreased fine motor;decreased gross motor LUE Deficits / Details: 4/5 globally, ROM is WFL LUE Sensation: WNL   Lower Extremity Assessment Lower Extremity Assessment: Defer to PT evaluation RLE Deficits / Details: hip flexors at 2/5, knee extensors at 3/5. ankle PF at 2/5   Cervical / Trunk Assessment Cervical / Trunk Assessment: Kyphotic   Communication Communication Communication: No difficulties   Cognition Arousal/Alertness: Awake/alert Behavior During Therapy: WFL for tasks assessed/performed Overall Cognitive Status: No family/caregiver present to determine baseline cognitive functioning                                 General Comments: Hx of dementia     General Comments  VSS on RA, no family present    Exercises     Shoulder Instructions      Home Living Family/patient expects to be discharged to:: Private residence Living Arrangements: Spouse/significant other Available Help at Discharge: Family;Available 24 hours/day Type of Home: House Home Access: Stairs to enter [employer] of Steps: 3 Entrance Stairs-Rails: Left Home Layout: One level     Bathroom Shower/Tub: Tub/shower unit;Walk-in Psychologist, prison and probation services: Standard     Home Equipment:  Transport planner;Wheelchair - Banker;Adaptive equipment;Hand held shower head;Tub bench;BSC/3in1;Cane - single point;Rolling Walker (2 wheels);Grab bars - tub/shower Adaptive Equipment: Reacher;Sock aid;Long-handled sponge Additional Comments: Adjustable bed without rails. Sliding board. Lift recliner      Prior Functioning/Environment Prior Level of Function : Needs assist             Mobility Comments: Has been using cane but needed assist with walking within the last couple of weeks. Per previous notes uses WC for longer distance. ADLs Comments: reports independence with ADLs, daughters and husband assist for iADLs        OT Problem List: Decreased strength;Decreased range of motion;Impaired balance (sitting and/or standing);Decreased activity tolerance;Decreased cognition;Decreased safety awareness;Decreased knowledge of use of DME or AE;Pain;Impaired UE functional use;Cardiopulmonary status limiting activity      OT Treatment/Interventions: Self-care/ADL training;Therapeutic exercise;DME and/or AE instruction;Therapeutic activities;Balance training;Patient/family education    OT Goals(Current goals can be found in the care plan section) Acute Rehab OT Goals Patient Stated Goal: to be stronger OT Goal Formulation: With patient Time For Goal Achievement: 05/20/22 Potential to Achieve Goals: Good ADL Goals Pt Will Perform Grooming: with supervision;sitting Pt Will Perform Upper Body Dressing: with set-up;sitting Pt Will Perform Lower Body Dressing: with min assist Pt Will Transfer to Toilet: with min assist;ambulating Additional ADL Goal #1: Pt will demonstrate increased activity tolerance to complete at least 5 minutes of standing functional activity with generalized supervision  OT Frequency: Min 2X/week    Co-evaluation PT/OT/SLP Co-Evaluation/Treatment: Yes Reason for Co-Treatment: Complexity of the patient's impairments (  multi-system involvement);For  patient/therapist safety;To address functional/ADL transfers PT goals addressed during session: Balance;Mobility/safety with mobility OT goals addressed during session: ADL's and self-care      AM-PAC OT "6 Clicks" Daily Activity     Outcome Measure Help from another person eating meals?: A Little Help from another person taking care of personal grooming?: A Little Help from another person toileting, which includes using toliet, bedpan, or urinal?: A Lot Help from another person bathing (including washing, rinsing, drying)?: A Lot Help from another person to put on and taking off regular upper body clothing?: A Little Help from another person to put on and taking off regular lower body clothing?: A Lot 6 Click Score: 15   End of Session Equipment Utilized During Treatment: Gait belt Nurse Communication: Mobility status  Activity Tolerance: Patient tolerated treatment well Patient left: in bed;with call bell/phone within reach  OT Visit Diagnosis: Unsteadiness on feet (R26.81);Other abnormalities of gait and mobility (R26.89);Muscle weakness (generalized) (M62.81);Hemiplegia and hemiparesis Hemiplegia - Right/Left: Right Hemiplegia - dominant/non-dominant: Dominant Hemiplegia - caused by: Cerebral infarction                Time: 9774-1423 OT Time Calculation (min): 13 min Charges:  OT General Charges $OT Visit: 1 Visit OT Evaluation $OT Eval Moderate Complexity: 1 Mod   Elih Mooney D Causey 05/06/2022, 9:38 AM

## 2022-05-06 NOTE — Consult Note (Addendum)
Neurology Consultation  Reason for Consult: worsening right side weakness and increased confusion Referring Physician: Dr. Cathlean Sauer  CC: worsening right side weakness  History is obtained from:husband and medical record   HPI: [name] is a 76 y.o. 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 She presents to [facility] ED for progressive worsening of right side weakness and increased confusion/memory issues over the last 2 weeks. Per husband, she also has had very little intake of food or fluids over the last 5-6 months. Neurology consulted   LKW: 2 weeks ago  IV thrombolysis given?: no, outside window  Premorbid modified Rankin scale (mRS):  4-Needs assistance to walk and tend to bodily needs   ROS: Unable to obtain due to altered mental status.   Past Medical History:  Diagnosis Date   Acute diastolic (congestive) heart failure (HCC) 08/12/2017   Acute GI bleeding 11/14/2019   Acute kidney injury superimposed on chronic kidney disease ([place]) 11/17/2017   AKI (acute kidney injury) ([place]) 11/04/2017   Anemia    Anxiety    ARF (acute renal failure) ([place]) 06/06/2011   Arthritis    "shoulders; arms; hips" (02/04/2018)   Asthma    Atrial fibrillation (HCC)    Atrial flutter ([place])    s/p aflutter ablation at [facility]   Bacteriuria, asymptomatic 11/14/2020   Benign hypertension with ESRD (end-stage renal disease) ([place])    Blood transfusion    never had a reaction to blood transfusion   Cardiomyopathy Mar 2013   Mild, EF 50-55% by Mar 2013 ECHO, diast dysfxn II   Cardiomyopathy ([place])    CHF (congestive heart failure) ([place]) 2005   CHF (congestive heart failure) ([place])    Childhood asthma    Closed fracture of right distal radius 10/18/2016   CMV  (cytomegalovirus infection) ([place]) 10/03/2015   Constipation    takes Miralax daily   Constipation    Depression    takes Zoloft daily   Distal radius fracture, right    DVT of lower extremity, bilateral ([place]) 12/21/11   "they're there now; been there for 2 wks"   Eczema    End stage renal disease ([place]) 11/06/2011   ESRD (end stage renal disease) on dialysis [facility]) 09/2011   07-19-2015 had Kidney transplant at [facility]; "don't get dialysis anymore" (02/04/2018)   Essential hypertension 07/07/2007   Qualifier: Diagnosis of  By: Garen Grams     Fractures, stress    in both feet--6 OR 7 YRS AGO--RESOLVED   Generalized edema [id number]   GI bleed 11/14/2019   Gout    doesn't require meds    HCAP (healthcare-associated pneumonia) 09/10/2011   Hearing difficulty    Hematoma of left lower leg 05/17/2020   High cholesterol    History of CVA (cerebrovascular accident) without residual deficits    History of hip replacement, total, right    History of pneumonia    Hx of Clostridium difficile infection    Hypercalcemia    09/10/11   Hyperparathyroidism ([place]) 04/07/2013   Hypertension    takes Diltiazem daily    Hypomagnesemia 08/02/2015   Hypophosphatemia 11/08/2017   ICB (intracranial bleed) ([place])    Immunosuppression ([place]) 07/25/2015   Memory changes    Morbid obesity ([place])  Nonischemic dilated cardiomyopathy ([place])    Obesity 04/07/2013   Oligouria    Oral mucositis 02/22/2018   OSA on CPAP    PAF (paroxysmal atrial fibrillation) (HCC)     HX OF CEREBRAL BLEED WHILE ON COUMADIN-SO PT NOT ON ANY BLOOD THINNERS NOW   Peripheral vascular disease ([place])    Persistent atrial fibrillation ([place]) 08/12/2017   Overview:  Added automatically from request for surgery [phone]  Formatting of this note might be different from the original. Added automatically from request for surgery [id number]   Presence of Watchman left atrial appendage closure device 10/04/2017   Proteinuria 09/04/2016   Psoriasis  08/07/2016   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/25/2015   S/P insertion of IVC (inferior vena caval) filter    Sepsis ([place]) 11/08/2017   Stress fracture    bilateral feet   Stroke ([place]) 2009   denies residual on 02/04/2018;  hemorrhagic now off coumadin   Stroke ([place]) 07/17/2007   denies residual, hemorrhagic now off coumadin   Stroke due to intracerebral hemorrhage ([place]) 2009   Suture reaction 09/04/2016   Transfusion history    Use of cane as ambulatory aid      Family History  Problem Relation Age of Onset   Cancer Father    Diabetes Mother    Hypertension Mother    Arthritis Mother    Hodgkin's lymphoma Other 28       dscd---HODGKINS DISEASE   Hypertension Brother      Social History:   reports that she has never smoked. She has never used smokeless tobacco. She reports that she does not drink alcohol and does not use drugs.  Medications  Current Facility-Administered Medications:    acetaminophen (TYLENOL) tablet 650 mg, 650 mg, Oral, Q4H PRN **OR** acetaminophen (TYLENOL) 160 MG/5ML solution 650 mg, 650 mg, Per Tube, Q4H PRN **OR** acetaminophen (TYLENOL) suppository 650 mg, 650 mg, Rectal, Q4H PRN, Jonelle Sidle, Mohammad L, MD   aspirin EC tablet 81 mg, 81 mg, Oral, Daily, Garba, Mohammad L, MD, 81 mg at 05/06/22 0926   atorvastatin (LIPITOR) tablet 20 mg, 20 mg, Oral, Daily, Jonelle Sidle, Mohammad L, MD, 20 mg at 05/06/22 7106   calcitonin (salmon) (MIACALCIN/FORTICAL) nasal spray 1 spray, 1 spray, Alternating Nares, Daily, Jonelle Sidle, Mohammad L, MD, 1 spray at 05/06/22 0925   carvedilol (COREG) tablet 3.125 mg, 3.125 mg, Oral, BID WC, Jonelle Sidle, Mohammad L, MD, 3.125 mg at 05/06/22 0926   cefTRIAXone (ROCEPHIN) 1 g in sodium chloride 0.9 % 100 mL IVPB, 1 g, Intravenous, Q24H, Garba, Mohammad L, MD   FLUoxetine (PROZAC) capsule 40 mg, 40 mg, Oral, Daily, Garba, Mohammad L, MD, 40 mg at 05/06/22 0926   [START ON 05/07/2022] furosemide (LASIX) tablet 80 mg, 80 mg, Oral, Once per day on Mon Wed Fri,  Garba, Mohammad L, MD   mycophenolate (MYFORTIC) EC tablet 180 mg, 180 mg, Oral, BID, Jonelle Sidle, Mohammad L, MD, 180 mg at 05/06/22 2694   oxyCODONE (Oxy IR/ROXICODONE) immediate release tablet 5 mg, 5 mg, Oral, Q6H PRN, Jonelle Sidle, Mohammad L, MD   pantoprazole (PROTONIX) EC tablet 20 mg, 20 mg, Oral, Daily, Jonelle Sidle, Mohammad L, MD, 20 mg at 05/06/22 8546   predniSONE (DELTASONE) tablet 5 mg, 5 mg, Oral, Daily, Jonelle Sidle, Mohammad L, MD, 5 mg at 05/06/22 2703   senna-docusate (Senokot-S) tablet 1 tablet, 1 tablet, Oral, QHS PRN, Jonelle Sidle, Mohammad L, MD   tacrolimus (PROGRAF) capsule 1 mg, 1 mg, Oral, BID, Gala Romney L, MD, 1 mg at 05/06/22 [phone]  Current Outpatient Medications:    acetaminophen (TYLENOL) 500 MG tablet, Take 500 mg by mouth every 6 (six) hours as needed for mild pain or headache. , Disp: , Rfl:    albuterol (PROVENTIL) (2.5 MG/3ML) 0.083% nebulizer solution, Take 3 mLs (2.5 mg total) by nebulization every 6 (six) hours as needed for wheezing or shortness of breath. Dx:J45.909, Disp: 150 mL, Rfl: 2   aspirin EC 81 MG tablet, Take 81 mg by mouth daily. Swallow whole., Disp: , Rfl:    atorvastatin (LIPITOR) 20 MG tablet, TAKE 1 TABLET BY MOUTH EVERY DAY, Disp: 90 tablet, Rfl: 1   budesonide-formoterol (SYMBICORT) 160-4.5 MCG/ACT inhaler, Inhale 2 puffs into the lungs 2 (two) times daily., Disp: 1 each, Rfl: 5   calcitonin, salmon, (MIACALCIN) 200 UNIT/ACT nasal spray, Place 1 spray into alternate nostrils daily., Disp: 3.7 mL, Rfl: 1   carvedilol (COREG) 3.125 MG tablet, Take 1 tablet (3.125 mg total) by mouth 2 (two) times daily with a meal., Disp: 180 tablet, Rfl: 3   docusate sodium (COLACE) 100 MG capsule, Take 100 mg by mouth daily., Disp: , Rfl:    DULoxetine (CYMBALTA) 30 MG capsule, TAKE 1 CAPSULE BY MOUTH EVERY DAY, Disp: 90 capsule, Rfl: 3   FLUoxetine (PROZAC) 20 MG capsule, 2 qam, Disp: 60 capsule, Rfl: 2   furosemide (LASIX) 80 MG tablet, Take 80 mg by mouth 3 (three) times a week.,  Disp: , Rfl:    lactulose (CHRONULAC) 10 GM/15ML solution, Take 30 mLs (20 g total) by mouth daily as needed for mild constipation or severe constipation., Disp: 236 mL, Rfl: 0   mycophenolate (MYFORTIC) 180 MG EC tablet, Take 180 mg by mouth 2 (two) times daily., Disp: , Rfl:    ondansetron (ZOFRAN) 4 MG tablet, Take 1 tablet (4 mg total) by mouth every 8 (eight) hours as needed for up to 9 doses for nausea., Disp: 9 tablet, Rfl: 0   oxyCODONE (OXY IR/ROXICODONE) 5 MG immediate release tablet, Take 1 tablet (5 mg total) by mouth every 6 (six) hours as needed for severe pain., Disp: 10 tablet, Rfl: 0   pantoprazole (PROTONIX) 20 MG tablet, TAKE 1 TABLET BY MOUTH EVERY DAY, Disp: 90 tablet, Rfl: 0   polyethylene glycol (MIRALAX / GLYCOLAX) 17 g packet, Take 17 g by mouth daily. Hold if diarrhea, Disp: 14 each, Rfl: 0   potassium chloride (MICRO-K) 10 MEQ CR capsule, Take 10 mEq by mouth 3 (three) times a week., Disp: , Rfl:    predniSONE (DELTASONE) 5 MG tablet, Take 5 mg by mouth daily., Disp: , Rfl:    tacrolimus (PROGRAF) 1 MG capsule, Take 1 mg by mouth 2 (two) times daily., Disp: , Rfl:    Exam: Current vital signs: BP (!) 146/89   Pulse 70   Temp (!) 97.4 F (36.3 C) (Oral)   Resp (!) 24   SpO2 100%  Vital signs in last 24 hours: Temp:  [97.3 F (36.3 C)-98.5 F (36.9 C)] 97.4 F (36.3 C) (10/22 0921) Pulse Rate:  [68-75] 70 (10/22 0900) Resp:  [14-31] 24 (10/22 0900) BP: (140-157)/(81-94) 146/89 (10/22 0900) SpO2:  [93 %-100 %] 100 % (10/22 0900)  GENERAL: Awake, alert in NAD HEENT: - Normocephalic and atraumatic, dry mm LUNGS - Clear to auscultation bilaterally with no wheezes CV - S1S2 RRR, no m/r/g, equal pulses bilaterally. ABDOMEN - Soft, nontender, nondistended with normoactive BS Ext: warm, well perfused, intact peripheral pulses, right leg and lower calf 4+ edema  NEURO:  Mental Status: AA&Ox3, person place and time, not to situation or current events. Expressive  aphasia  Language: speech is clear .   Cranial Nerves: PERRL EOMI, visual fields full, right facial asymmetry, facial sensation intact, hearing intact, tongue/uvula/soft palate midline, normal sternocleidomastoid and trapezius muscle strength. No evidence of tongue atrophy or fibrillations Motor: right upper 4/5, right lower 4/5, left arm 5/5, left leg can barely lift off of bed 2/5 Tone: is normal and bulk is normal Sensation- decreased on right side  Coordination: FTN intact bilaterally Gait- deferred  NIHSS 1a Level of Conscious.: 0 1b LOC Questions: 0 1c LOC Commands: 0 2 Best Gaze: 0 3 Visual: 0 4 Facial Palsy: 2 5a Motor Arm - left: 0 5b Motor Arm - Right: 1 6a Motor Leg - Left: 3 6b Motor Leg - Right: 1 7 Limb Ataxia: 0 8 Sensory: 1 9 Best Language: 1 10 Dysarthria: 0 11 Extinct. and Inatten.: 0 TOTAL: 9   Labs I have reviewed labs in epic and the results pertinent to this consultation are:  CBC    Component Value Date/Time   WBC 4.8 05/05/2022 1043   RBC 4.55 05/05/2022 1043   HGB 12.4 05/05/2022 1043   HGB 10.8 (L) 11/27/2019 1616   HCT 41.6 05/05/2022 1043   HCT 26.5 (L) 04/04/2021 1422   PLT 126 (L) 05/05/2022 1043   PLT 284 11/27/2019 1616   MCV 91.4 05/05/2022 1043   MCV 88 11/27/2019 1616   MCH 27.3 05/05/2022 1043   MCHC 29.8 (L) 05/05/2022 1043   RDW 18.0 (H) 05/05/2022 1043   RDW 15.1 11/27/2019 1616   LYMPHSABS 0.7 05/05/2022 1043   LYMPHSABS 0.6 (L) 11/27/2019 1616   MONOABS 0.5 05/05/2022 1043   EOSABS 0.0 05/05/2022 1043   EOSABS 0.0 11/27/2019 1616   BASOSABS 0.1 05/05/2022 1043   BASOSABS 0.1 11/27/2019 1616    CMP     Component Value Date/Time   NA 137 05/05/2022 1043   NA 140 12/16/2019 0000   K 4.5 05/05/2022 1043   CL 107 05/05/2022 1043   CO2 25 05/05/2022 1043   GLUCOSE 111 (H) 05/05/2022 1043   BUN 48 (H) 05/05/2022 1043   BUN 23 (A) 12/16/2019 0000   CREATININE 3.41 (H) 05/05/2022 1043   CALCIUM 9.0 05/05/2022 1043    CALCIUM 11.1 (H) 09/11/2011 0825   PROT 5.9 (L) 05/05/2022 1043   PROT 6.6 05/01/2022 1448   ALBUMIN 2.9 (L) 05/05/2022 1043   AST 15 05/05/2022 1043   ALT 12 05/05/2022 1043   ALKPHOS 70 05/05/2022 1043   BILITOT 0.7 05/05/2022 1043   GFRNONAA 14 (L) 05/05/2022 1043   GFRAA 24 (L) 03/13/2020 1118    Lipid Panel     Component Value Date/Time   CHOL 99 05/06/2022 0608   TRIG 58 05/06/2022 0608   HDL 47 05/06/2022 0608   CHOLHDL 2.1 05/06/2022 0608   VLDL 12 05/06/2022 0608   LDLCALC 40 05/06/2022 0947     Imaging I have reviewed the images obtained:  CT-head 1. Negative for bleed or other acute intracranial process. 2. Atrophy and nonspecific white matter changes.   [name] RLE 10/6- Negative for DVT  Assessment:  [name] is a 76 yo female who presents for increasing right side weakness and increased confusion progressing over the last 2 weeks.  We do suspect that this does represent likely recurrent ischemic stroke, MRI pending.  Probable Stroke  UTI  Recommendations: - MRI on  Monday (has PPM) to evaluate for stroke  - bedside swallow evaluation  - continue ASA and Lipitor  - Check B1, B12, TSH, folate and ESR  Beulah Gandy DNP, ACNPC-AG   I have seen the patient reviewed the above note.  I was able to get her to lift her left leg up to some degree, but she does have weakness of the left leg as well.  She has had worsening right-sided weakness, I suspect that she likely has had a recurrent stroke, though it is also possible that this does represent recrudescence.  She will need MRI, which is not possible to be done until tomorrow.  She is undergoing treatment for UTI with internal medicine.  Stroke team to follow.  Roland Rack, MD [facility] [phone]  If 7pm- 7am, please page neurology on call as listed in [place].

## 2022-05-06 NOTE — Progress Notes (Signed)
Completed NIH scale and notified MD Mike Craze. No new orders at this time.

## 2022-05-06 NOTE — Evaluation (Signed)
Physical Therapy Evaluation Patient Details Name: [name] MRN: [id number] DOB: 15-Oct-1945 Today's Date: 05/06/2022  History of Present Illness  Pt is a 76 y/o female admitted secondary to increased R weakness and difficulty walking. Awaiting MRI. PMH includes a fib, dementia, HTN, ESRD s/p renal transplant and PE.  Clinical Impression  Pt admitted secondary to problem above with deficits below. Pt with increased R sided weakness and heavy R lateral lean in standing. Difficulty taking side steps and requiring mod A +2 throughout. Recommending SNF level therapies at d/c. Will continue to follow acutely.        Recommendations for follow up therapy are one component of a multi-disciplinary discharge planning process, led by the attending physician.  Recommendations may be updated based on patient status, additional functional criteria and insurance authorization.  Follow Up Recommendations Skilled nursing-short term rehab (<3 hours/day) (unless family can provide needed support) Can patient physically be transported by private vehicle: No    Assistance Recommended at Discharge Frequent or constant Supervision/Assistance  Patient can return home with the following  Two people to help with walking and/or transfers;Two people to help with bathing/dressing/bathroom;Assistance with cooking/housework;Assist for transportation;Help with stairs or ramp for entrance    Equipment Recommendations None recommended by PT  Recommendations for Other Services       Functional Status Assessment Patient has had a recent decline in their functional status and demonstrates the ability to make significant improvements in function in a reasonable and predictable amount of time.     Precautions / Restrictions Precautions Precautions: Fall Restrictions Weight Bearing Restrictions: No      Mobility  Bed Mobility Overal bed mobility: Needs Assistance Bed Mobility: Supine to Sit, Sit to Supine      Supine to sit: Mod assist, +2 for safety/equipment Sit to supine: Mod assist, +2 for physical assistance   General bed mobility comments: Assist for trunk and LE throughout.    Transfers Overall transfer level: Needs assistance Equipment used: 2 person hand held assist Transfers: Sit to/from Stand Sit to Stand: Mod assist, +2 physical assistance           General transfer comment: Mod A +2 for lift assist and steadying. Attempted to take side steps, however, pt with heavy R lateral lean and difficulty advancing RLE.    Ambulation/Gait                  Stairs            Wheelchair Mobility    Modified Rankin (Stroke Patients Only)       Balance Overall balance assessment: Needs assistance Sitting-balance support: Bilateral upper extremity supported Sitting balance-Leahy Scale: Poor Sitting balance - Comments: Reliant on UE and up to min A for sitting balance Postural control: Right lateral lean Standing balance support: Bilateral upper extremity supported Standing balance-Leahy Scale: Poor Standing balance comment: R lateral lean. Reliant on UE and external support .                             Pertinent Vitals/Pain Pain Assessment Pain Assessment: No/denies pain    Home Living Family/patient expects to be discharged to:: Private residence Living Arrangements: Spouse/significant other Available Help at Discharge: Family;Available 24 hours/day Type of Home: House Home Access: Stairs to enter Entrance Stairs-Rails: Left Entrance Stairs-Number of Steps: 3   Home Layout: One level Home Equipment: Transport planner;Wheelchair - Banker;Adaptive equipment;Hand held shower head;Tub bench;BSC/3in1;Cane - single  point;Rolling Walker (2 wheels);Grab bars - tub/shower Additional Comments: Adjustable bed without rails. Sliding board. Lift recliner    Prior Function Prior Level of Function : Needs assist             Mobility  Comments: Has been using cane but needed assist with walking within the last couple of weeks. Per previous notes uses WC for longer distance. ADLs Comments: reports independence with ADLs, daughters and husband assist for iADLs     Hand Dominance        Extremity/Trunk Assessment   Upper Extremity Assessment Upper Extremity Assessment: Defer to OT evaluation    Lower Extremity Assessment Lower Extremity Assessment: RLE deficits/detail RLE Deficits / Details: hip flexors at 2/5, knee extensors at 3/5. ankle PF at 2/5    Cervical / Trunk Assessment Cervical / Trunk Assessment: Kyphotic  Communication   Communication: No difficulties  Cognition Arousal/Alertness: Awake/alert Behavior During Therapy: WFL for tasks assessed/performed Overall Cognitive Status: No family/caregiver present to determine baseline cognitive functioning                                 General Comments: Hx of dementia        General Comments      Exercises     Assessment/Plan    PT Assessment Patient needs continued PT services  PT Problem List Decreased strength;Decreased balance;Decreased activity tolerance;Decreased mobility;Decreased knowledge of use of DME;Decreased knowledge of precautions;Decreased cognition;Decreased safety awareness       PT Treatment Interventions DME instruction;Gait training;Functional mobility training;Therapeutic activities;Therapeutic exercise;Balance training;Patient/family education;Cognitive remediation    PT Goals (Current goals can be found in the Care Plan section)  Acute Rehab PT Goals Patient Stated Goal: to get stronger PT Goal Formulation: With patient Time For Goal Achievement: 05/20/22 Potential to Achieve Goals: Good    Frequency Min 2X/week     Co-evaluation PT/OT/SLP Co-Evaluation/Treatment: Yes Reason for Co-Treatment: For patient/therapist safety;To address functional/ADL transfers PT goals addressed during session:  Balance;Mobility/safety with mobility         AM-PAC PT "6 Clicks" Mobility  Outcome Measure Help needed turning from your back to your side while in a flat bed without using bedrails?: A Lot Help needed moving from lying on your back to sitting on the side of a flat bed without using bedrails?: A Lot Help needed moving to and from a bed to a chair (including a wheelchair)?: Total Help needed standing up from a chair using your arms (e.g., wheelchair or bedside chair)?: Total Help needed to walk in hospital room?: Total Help needed climbing 3-5 steps with a railing? : Total 6 Click Score: 8    End of Session Equipment Utilized During Treatment: Gait belt Activity Tolerance: Patient limited by fatigue Patient left: in bed;with call bell/phone within reach (on stretcher in ED) Nurse Communication: Mobility status PT Visit Diagnosis: Unsteadiness on feet (R26.81);Muscle weakness (generalized) (M62.81);Difficulty in walking, not elsewhere classified (R26.2)    Time: 5465-6812 PT Time Calculation (min) (ACUTE ONLY): 15 min   Charges:   PT Evaluation $PT Eval Moderate Complexity: 1 Mod          Reuel Derby, PT, DPT  Acute Rehabilitation Services  Office: [phone]   Rudean Hitt 05/06/2022, 9:06 AM

## 2022-05-06 NOTE — ED Notes (Signed)
I tried to get patient blood I didn't have any success. 

## 2022-05-06 NOTE — Progress Notes (Addendum)
CSW spoke with Barnett Applebaum of [facility] who confirmed patient is [name] waiver eligible.  Patient will be faxed to facilities that accept the waiver ([address], [place], [place], [place], and [facility]).  Madilyn Fireman, MSW, LCSW Transitions of Care  Clinical Social Worker II [phone]

## 2022-05-06 NOTE — NC FL2 (Signed)
[place] LEVEL OF CARE SCREENING TOOL     IDENTIFICATION  Patient Name: [name] Birthdate: March 12, 1946 Sex: female Admission Date (Current Location): 05/05/2022  [facility] and [place] Number:  Herbalist and Address:  [facility], [place] [address], [place], [place] [address]      Provider Number: [id number]  Attending Physician Name and Address:  Tawni Millers  Relative Name and Phone Number:       Current Level of Care: Hospital Recommended Level of Care: [place] Prior Approval Number:    Date Approved/Denied:   PASRR Number: [id number]  Discharge Plan: SNF    Current Diagnoses: Patient Active Problem List   Diagnosis Date Noted   TIA (transient ischemic attack) 05/05/2022   Compression fracture of L1 lumbar vertebra ([place]) 04/26/2022   Compression fracture of L2 lumbar vertebra ([place]) 04/26/2022   Vitamin D deficiency 04/26/2022   Episodic memory loss 04/19/2022   Age-related osteoporosis with current pathological fracture 04/19/2022   Protein-calorie malnutrition ([place]) 03/30/2022   ABLA (acute blood loss anemia) 01/19/2022   Bilateral renal cysts 01/19/2022   At high risk for falls 01/11/2022   Hematoma 01/11/2022   Right lower quadrant abdominal mass 12/25/2021   Postinflammatory pulmonary fibrosis ([place]) 11/14/2021   Renal transplant recipient 08/13/2021   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   Immunocompromised patient ([place]) 08/13/2021   Chronic atrial fibrillation (HCC)    Right leg weakness 07/23/2021   Erosive esophagitis/esophageal ulceration/duodenal ulceration 07/23/2021   Edema of right lower leg 07/23/2021   Acute GI bleeding 07/08/2021   Azotemia 07/08/2021   Pressure injury of skin 07/08/2021   Asthma, chronic, unspecified asthma severity, with acute exacerbation 06/15/2021   Acute on chronic combined systolic and diastolic CHF (congestive heart failure)  ([place]) 06/15/2021   Duodenal ulcer 03/29/2021   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   Gastroesophageal reflux disease 06/29/2020   Iron deficiency anemia 06/29/2020   Traumatic seroma of lower leg, right, subsequent encounter 01/28/2020   Other secondary pulmonary hypertension ([place]) 12/03/2019   Anticoagulation management encounter 11/04/2019   Pacemaker 04/18/2018   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   Personal history of PE (pulmonary embolism) 78/24/2353   Chronic systolic heart failure ([place]) 02/11/2018   Hyperkalemia 02/03/2018   Lower extremity edema 12/10/2017   Presence of Watchman left atrial appendage closure device 10/04/2017   Dyspnea 07/26/2017   Atrial flutter ([place])    Oligouria    Hypertension    Fractures, stress    Eczema    Depression    Constipation    Arthritis    Exacerbation of asthma 04/01/2017   Closed fracture of right distal radius 10/18/2016   Proteinuria 09/04/2016   History of kidney transplant 2017 07/25/2015   Immunosuppression ([place]) 07/25/2015   Nonischemic dilated cardiomyopathy ([place])    S/P insertion of IVC (inferior vena caval) filter 04/07/2013   Use of cane as ambulatory aid 04/07/2013   S/P hip replacement 03/25/2012   Personal history of DVT/PE s/p IVC filter 12/08/2011   Obstructive sleep apnea 12/08/2011   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[place]) 10/24/2011   CKD (chronic kidney disease), 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[place]) 09/14/2011   Hypercalcemia    Gout 09/10/2011   Acute blood loss anemia secondary to diverticular bleed  09/04/2011   Absence of right hip joint with antibiotic pacer 09/03/2011   Traumatic seroma of thigh ([place]) 06/11/2011   Clostridium difficile colitis 06/09/2011   Symptomatic anemia  06/06/2011   SINUSITIS- ACUTE-NOS 09/27/2010   Atrial fibrillation s/p AVN ablation/PPM implant, s/p LAAC with Watchman 11/25/2008   Cerebral artery occlusion with cerebral infarction ([place]) 09/28/2008   History of CVA with residual deficit 09/28/2008    Hemiplegia ([place]) 09/21/2008   Psoriasis 10/20/2007   Asthmatic bronchitis without complication 71/21/9758    Orientation RESPIRATION BLADDER Height & Weight     Self, Time, Situation, Place  Normal Continent, External catheter Weight:   Height:     BEHAVIORAL SYMPTOMS/MOOD NEUROLOGICAL BOWEL NUTRITION STATUS      Continent Diet  AMBULATORY STATUS COMMUNICATION OF NEEDS Skin   Extensive Assist Verbally Normal                       Personal Care Assistance Level of Assistance  Bathing, Feeding, Dressing Bathing Assistance: Limited assistance Feeding assistance: Limited assistance Dressing Assistance: Limited assistance     Functional Limitations Info  Sight, Hearing, Speech Sight Info: Adequate Hearing Info: Adequate Speech Info: Adequate    SPECIAL CARE FACTORS FREQUENCY  PT (By licensed PT), OT (By licensed OT)     PT Frequency: 5x weekly OT Frequency: 5x weekly            Contractures Contractures Info: Not present    Additional Factors Info  Code Status, Allergies Code Status Info: Full Code Allergies Info: Warfarin Sodium   Ace Inhibitors   Amiodarone   Amlodipine           Current Medications (05/06/2022):  This is the current hospital active medication list Current Facility-Administered Medications  Medication Dose Route Frequency Provider Last Rate Last Admin   acetaminophen (TYLENOL) tablet 650 mg  650 mg Oral Q4H PRN Elwyn Reach, MD       Or   acetaminophen (TYLENOL) 160 MG/5ML solution 650 mg  650 mg Per Tube Q4H PRN Elwyn Reach, MD       Or   acetaminophen (TYLENOL) suppository 650 mg  650 mg Rectal Q4H PRN Elwyn Reach, MD       aspirin EC tablet 81 mg  81 mg Oral Daily Gala Romney L, MD   81 mg at 05/06/22 0926   atorvastatin (LIPITOR) tablet 20 mg  20 mg Oral Daily Gala Romney L, MD   20 mg at 05/06/22 8325   calcitonin (salmon) (MIACALCIN/FORTICAL) nasal spray 1 spray  1 spray Alternating Nares Daily Elwyn Reach, MD   1 spray at 05/06/22 0925   carvedilol (COREG) tablet 3.125 mg  3.125 mg Oral BID WC Gala Romney L, MD   3.125 mg at 05/06/22 0926   cefTRIAXone (ROCEPHIN) 1 g in sodium chloride 0.9 % 100 mL IVPB  1 g Intravenous Q24H Gala Romney L, MD       FLUoxetine (PROZAC) capsule 40 mg  40 mg Oral Daily Gala Romney L, MD   40 mg at 05/06/22 0926   [START ON 05/07/2022] furosemide (LASIX) tablet 80 mg  80 mg Oral Once per day on Mon Wed Fri Garba, Mohammad L, MD       mycophenolate (MYFORTIC) EC tablet 180 mg  180 mg Oral BID Gala Romney L, MD   180 mg at 05/06/22 4982   oxyCODONE (Oxy IR/ROXICODONE) immediate release tablet 5 mg  5 mg Oral Q6H PRN Elwyn Reach, MD       pantoprazole (PROTONIX) EC tablet 20 mg  20 mg Oral Daily Elwyn Reach, MD   20 mg  at 05/06/22 0927   predniSONE (DELTASONE) tablet 5 mg  5 mg Oral Daily Gala Romney L, MD   5 mg at 05/06/22 9767   senna-docusate (Senokot-S) tablet 1 tablet  1 tablet Oral QHS PRN Elwyn Reach, MD       tacrolimus (PROGRAF) capsule 1 mg  1 mg Oral BID Elwyn Reach, MD   1 mg at 05/06/22 3419   Current Outpatient Medications  Medication Sig Dispense Refill   acetaminophen (TYLENOL) 500 MG tablet Take 500 mg by mouth every 6 (six) hours as needed for mild pain or headache.      albuterol (PROVENTIL) (2.5 MG/3ML) 0.083% nebulizer solution Take 3 mLs (2.5 mg total) by nebulization every 6 (six) hours as needed for wheezing or shortness of breath. Dx:J45.909 150 mL 2   aspirin EC 81 MG tablet Take 81 mg by mouth daily. Swallow whole.     atorvastatin (LIPITOR) 20 MG tablet TAKE 1 TABLET BY MOUTH EVERY DAY 90 tablet 1   budesonide-formoterol (SYMBICORT) 160-4.5 MCG/ACT inhaler Inhale 2 puffs into the lungs 2 (two) times daily. 1 each 5   calcitonin, salmon, (MIACALCIN) 200 UNIT/ACT nasal spray Place 1 spray into alternate nostrils daily. 3.7 mL 1   carvedilol (COREG) 3.125 MG tablet Take 1 tablet (3.125 mg  total) by mouth 2 (two) times daily with a meal. 180 tablet 3   docusate sodium (COLACE) 100 MG capsule Take 100 mg by mouth daily.     DULoxetine (CYMBALTA) 30 MG capsule TAKE 1 CAPSULE BY MOUTH EVERY DAY 90 capsule 3   FLUoxetine (PROZAC) 20 MG capsule 2 qam 60 capsule 2   furosemide (LASIX) 80 MG tablet Take 80 mg by mouth 3 (three) times a week.     lactulose (CHRONULAC) 10 GM/15ML solution Take 30 mLs (20 g total) by mouth daily as needed for mild constipation or severe constipation. 236 mL 0   mycophenolate (MYFORTIC) 180 MG EC tablet Take 180 mg by mouth 2 (two) times daily.     ondansetron (ZOFRAN) 4 MG tablet Take 1 tablet (4 mg total) by mouth every 8 (eight) hours as needed for up to 9 doses for nausea. 9 tablet 0   oxyCODONE (OXY IR/ROXICODONE) 5 MG immediate release tablet Take 1 tablet (5 mg total) by mouth every 6 (six) hours as needed for severe pain. 10 tablet 0   pantoprazole (PROTONIX) 20 MG tablet TAKE 1 TABLET BY MOUTH EVERY DAY 90 tablet 0   polyethylene glycol (MIRALAX / GLYCOLAX) 17 g packet Take 17 g by mouth daily. Hold if diarrhea 14 each 0   potassium chloride (MICRO-K) 10 MEQ CR capsule Take 10 mEq by mouth 3 (three) times a week.     predniSONE (DELTASONE) 5 MG tablet Take 5 mg by mouth daily.     tacrolimus (PROGRAF) 1 MG capsule Take 1 mg by mouth 2 (two) times daily.       Discharge Medications: Please see discharge summary for a list of discharge medications.  Relevant Imaging Results:  Relevant Lab Results:   Additional Information SSN: [id number]  Archie Endo, LCSW

## 2022-05-06 NOTE — Assessment & Plan Note (Signed)
-   Continue with PPI 

## 2022-05-06 NOTE — Assessment & Plan Note (Signed)
No anticoagulation due to GI bleeding

## 2022-05-06 NOTE — Assessment & Plan Note (Signed)
Patient with persistent right sided weakness  Plan to continue neuro checks per unit protocol Follow up with MRI  Continue with aspirin and atorvastatin

## 2022-05-06 NOTE — ED Notes (Signed)
ED TO INPATIENT HANDOFF REPORT  ED Nurse Name and Phone #: Lani Mendiola RN [phone]  S Name/Age/Gender [name] 76 y.o. female Room/Bed: 001C/001C  Code Status   Code Status: Full Code  Home/SNF/Other Home Patient oriented to: self, place, time, and situation Is this baseline? Yes   Triage Complete: Triage complete  Chief Complaint TIA (transient ischemic attack) [G45.9]  Triage Note Patient arrived by [facility] from home with complaint of acute weakness last night that resolved. Patient uses cane at home due to previous stroke. Patient alert and oriented. Per EMS patient with dementia. MAE with residual weakness to right leg from previous stroke   Allergies Allergies  Allergen Reactions   Warfarin Sodium Other (See Comments)    Caused her to have a stroke/left side of brain to hemorrhage    Ace Inhibitors Cough   Amiodarone Other (See Comments)    Pt with Restrictive Lung Disease by 10/2017 PFT with decreased DLCO   Amlodipine Swelling    Swelling in feet    Level of Care/Admitting Diagnosis ED Disposition     ED Disposition  Admit   Condition  --   [place]: [place] [[id number]]  Level of Care: Telemetry Cardiac [[id number]]  May place patient in observation at [facility] or [facility] if equivalent level of care is available:: No  Covid Evaluation: Confirmed COVID Negative  Diagnosis: TIA (transient ischemic attack) [803212]  Admitting Physician: [place], [place]  Attending Physician: Elwyn Reach [[id number]]          B Medical/Surgery History Past Medical History:  Diagnosis Date   Acute diastolic (congestive) heart failure ([place]) 08/12/2017   Acute GI bleeding 11/14/2019   Acute kidney injury superimposed on chronic kidney disease ([place]) 11/17/2017   AKI (acute kidney injury) ([place]) 11/04/2017   Anemia    Anxiety    ARF (acute renal failure) ([place]) 06/06/2011   Arthritis    "shoulders; arms; hips" (02/04/2018)   Asthma     Atrial fibrillation (HCC)    Atrial flutter (HCC)    s/p aflutter ablation at [facility]   Bacteriuria, asymptomatic 11/14/2020   Benign hypertension with ESRD (end-stage renal disease) ([place])    Blood transfusion    never had a reaction to blood transfusion   Cardiomyopathy Mar 2013   Mild, EF 50-55% by Mar 2013 ECHO, diast dysfxn II   Cardiomyopathy ([place])    CHF (congestive heart failure) ([place]) 2005   CHF (congestive heart failure) ([place])    Childhood asthma    Closed fracture of right distal radius 10/18/2016   CMV (cytomegalovirus infection) ([place]) 10/03/2015   Constipation    takes Miralax daily   Constipation    Depression    takes Zoloft daily   Distal radius fracture, right    DVT of lower extremity, bilateral ([place]) 12/21/11   "they're there now; been there for 2 wks"   Eczema    End stage renal disease ([place]) 11/06/2011   ESRD (end stage renal disease) on dialysis [facility]) 09/2011   07-19-2015 had Kidney transplant at [facility]; "don't get dialysis anymore" (02/04/2018)   Essential hypertension 07/07/2007   Qualifier: Diagnosis of  By: Garen Grams     Fractures, stress    in both feet--6 OR 7 YRS AGO--RESOLVED   Generalized edema [id number]   GI bleed 11/14/2019   Gout    doesn't require meds    HCAP (healthcare-associated pneumonia) 09/10/2011   Hearing difficulty  Hematoma of left lower leg 05/17/2020   High cholesterol    History of CVA (cerebrovascular accident) without residual deficits    History of hip replacement, total, right    History of pneumonia    Hx of Clostridium difficile infection    Hypercalcemia    09/10/11   Hyperparathyroidism ([place]) 04/07/2013   Hypertension    takes Diltiazem daily    Hypomagnesemia 08/02/2015   Hypophosphatemia 11/08/2017   ICB (intracranial bleed) ([place])    Immunosuppression ([place]) 07/25/2015   Memory changes    Morbid obesity ([place])    Nonischemic dilated cardiomyopathy ([place])    Obesity 04/07/2013   Oligouria    Oral mucositis 02/22/2018    OSA on CPAP    PAF (paroxysmal atrial fibrillation) (HCC)     HX OF CEREBRAL BLEED WHILE ON COUMADIN-SO PT NOT ON ANY BLOOD THINNERS NOW   Peripheral vascular disease ([place])    Persistent atrial fibrillation ([place]) 08/12/2017   Overview:  Added automatically from request for surgery [id number]  Formatting of this note might be different from the original. Added automatically from request for surgery [id number]   Presence of Watchman left atrial appendage closure device 10/04/2017   Proteinuria 09/04/2016   Psoriasis 08/07/2016   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/25/2015   S/P insertion of IVC (inferior vena caval) filter    Sepsis ([place]) 11/08/2017   Stress fracture    bilateral feet   Stroke ([place]) 2009   denies residual on 02/04/2018;  hemorrhagic now off coumadin   Stroke ([place]) 07/17/2007   denies residual, hemorrhagic now off coumadin   Stroke due to intracerebral hemorrhage ([place]) 2009   Suture reaction 09/04/2016   Transfusion history    Use of cane as ambulatory aid    Past Surgical History:  Procedure Laterality Date   AV FISTULA PLACEMENT  09/28/2011   Procedure: ARTERIOVENOUS (AV) FISTULA CREATION;  Surgeon: Rosetta Posner, MD;  Location: [place];  Service: Vascular;  Laterality: Left;   BIOPSY  07/10/2021   Procedure: BIOPSY;  Surgeon: Irving Copas., MD;  Location: [facility];  Service: Gastroenterology;;   CARDIAC CATHETERIZATION     CARDIAC VALVE [place] DEFECT   CHOLECYSTECTOMY     1980's   COLONOSCOPY     COLONOSCOPY WITH PROPOFOL N/A 11/16/2019   Procedure: COLONOSCOPY WITH PROPOFOL;  Surgeon: Juanita Craver, MD;  Location[facility] ENDOSCOPY;  Service: Endoscopy;  Laterality: N/A;   CYSTOSCOPY     many yrs ago   ESOPHAGOGASTRODUODENOSCOPY N/A 07/10/2021   Procedure: ESOPHAGOGASTRODUODENOSCOPY (EGD);  Surgeon: Irving Copas., MD;  Location: [facility];  Service: Gastroenterology;  Laterality: N/A;   ESOPHAGOGASTRODUODENOSCOPY (EGD) WITH  PROPOFOL N/A 11/16/2019   Procedure: ESOPHAGOGASTRODUODENOSCOPY (EGD) WITH PROPOFOL;  Surgeon: Juanita Craver, MD;  Location: [facility] ENDOSCOPY;  Service: Endoscopy;  Laterality: N/A;   ESOPHAGOGASTRODUODENOSCOPY (EGD) WITH PROPOFOL N/A 03/20/2021   Procedure: ESOPHAGOGASTRODUODENOSCOPY (EGD) WITH PROPOFOL;  Surgeon: Milus Banister, MD;  Location: [facility] ENDOSCOPY;  Service: Endoscopy;  Laterality: N/A;   FEMUR FRACTURE SURGERY Right 03/2011; 09/03/2011   "had 2, 2 wk apart in 2012; broke it again 08/2011 & had OR"   [place] N/A 07/10/2021   Procedure: FLEXIBLE SIGMOIDOSCOPY;  Surgeon: Rush Landmark Telford Nab., MD;  Location: [facility];  Service: Gastroenterology;  Laterality: N/A;   FRACTURE SURGERY     GIVENS CAPSULE STUDY N/A 07/11/2021   Procedure: GIVENS CAPSULE STUDY;  Surgeon: Juanita Craver, MD;  [facility]: [facility] ENDOSCOPY;  Service: Endoscopy;  Laterality: N/A;   HEMOSTASIS CONTROL  03/20/2021   Procedure: HEMOSTASIS CONTROL;  Surgeon: Milus Banister, MD;  Location: [place];  Service: Endoscopy;;   HOT HEMOSTASIS N/A 03/20/2021   Procedure: HOT HEMOSTASIS (ARGON PLASMA COAGULATION/BICAP);  Surgeon: Milus Banister, MD;  Location: [facility] ENDOSCOPY;  Service: Endoscopy;  Laterality: N/A;   HOT HEMOSTASIS N/A 07/10/2021   Procedure: HOT HEMOSTASIS (ARGON PLASMA COAGULATION/BICAP);  Surgeon: Irving Copas., MD;  Location: [facility];  Service: Gastroenterology;  Laterality: N/A;   I & D EXTREMITY  09/15/2011   Procedure: IRRIGATION AND DEBRIDEMENT EXTREMITY w REMOVAL OF HARDWARE;  Surgeon: [name], MD;  Location: [place];  Service: Orthopedics;  Laterality: Right;   INCISION AND DRAINAGE HIP  12/21/2011   Procedure: IRRIGATION AND DEBRIDEMENT HIP;  Surgeon: [name], MD;  Location: [place];  Service: Orthopedics;  Laterality: Right;  I&D RIGHT HIP WITH PLACEMENT ANTIBIOTIC SPACER   INSERTION OF DIALYSIS CATHETER     Procedure: INSERTION OF DIALYSIS CATHETER;  Surgeon: Rosetta Posner, MD;  Location: [place];  Service: Vascular;  Laterality: Right;   IRRIGATION AND DEBRIDEMENT ABSCESS  12/21/11   right hip   JOINT REPLACEMENT     KIDNEY TRANSPLANT  07/19/15   LAPAROSCOPIC GASTRIC BANDING  2008   LEFT ATRIAL APPENDAGE OCCLUSION  10/04/2017   OPEN REDUCTION INTERNAL FIXATION (ORIF) DISTAL RADIAL FRACTURE Right 10/18/2016   Procedure: OPEN REDUCTION INTERNAL FIXATION (ORIF) RIGHT DISTAL RADIAL FRACTURE;  Surgeon: Marchia Bond, MD;  Location: [place];  Service: Orthopedics;  Laterality: Right;   SUBMUCOSAL TATTOO INJECTION  07/10/2021   Procedure: SUBMUCOSAL TATTOO INJECTION;  Surgeon: Irving Copas., MD;  Location: [facility];  Service: Gastroenterology;;   TOTAL HIP ARTHROPLASTY Right 02/2011   right THA 02/2011, I&D/removal of hardware 09/2011,, repeat I&D Jun 2013, reimplantation R THA 03-26-2012   TOTAL HIP REVISION  03/25/2012   Procedure: TOTAL HIP REVISION;  Surgeon: [name], MD;  Location: [facility];  Service: Orthopedics;  Laterality: Right;  Right Total Hip Reimplantation   TUBAL LIGATION     VENA CAVA FILTER PLACEMENT  11/2011     A IV Location/Drains/Wounds Patient Lines/Drains/Airways Status     Active Line/Drains/Airways     Name Placement date Placement time Site Days   Peripheral IV 05/05/22 20 G 1" Right Antecubital 05/05/22  1650  Antecubital  1   Vascular Access Left Arteriovenous fistula 09/28/11  0914  --  3873   Vascular Access Left Upper arm Arteriovenous fistula --  --  Upper arm  --   Vascular Access Left Upper arm Arteriovenous fistula --  --  Upper arm  --   Fistula / Graft Left Arteriovenous fistula --  --  --  --   External Urinary Catheter 05/05/22  1520  --  1   Pressure Injury 07/08/21 Sacrum Stage 2 -  Partial thickness loss of dermis presenting as a shallow open injury with a red, pink wound bed without slough. 07/08/21  0358  -- 302   Wound / Incision (Open or Dehisced) 07/25/21 Skin tear Perineum Right  1cm X 0.5cm stage 2 ulcer 07/25/21  0610  Perineum  285            Intake/Output Last 24 hours  Intake/Output Summary (Last 24 hours) at 05/06/2022 1538 Last data filed at 05/05/2022 2327 Gross per 24 hour  Intake 100.86 ml  Output --  Net 100.86 ml  Labs/Imaging Results for orders placed or performed during the hospital encounter of 05/05/22 (from the past 48 hour(s))  Comprehensive metabolic panel     Status: Abnormal   Collection Time: 05/05/22 10:43 AM  Result Value Ref Range   Sodium 137 135 - 145 mmol/L   Potassium 4.5 3.5 - 5.1 mmol/L   Chloride 107 98 - 111 mmol/L   CO2 25 22 - 32 mmol/L   Glucose, Bld 111 (H) 70 - 99 mg/dL    Comment: Glucose reference range applies only to samples taken after fasting for at least 8 hours.   BUN 48 (H) 8 - 23 mg/dL   Creatinine, Ser 3.41 (H) 0.44 - 1.00 mg/dL   Calcium 9.0 8.9 - 10.3 mg/dL   Total Protein 5.9 (L) 6.5 - 8.1 g/dL   Albumin 2.9 (L) 3.5 - 5.0 g/dL   AST 15 15 - 41 U/L   ALT 12 0 - 44 U/L   Alkaline Phosphatase 70 38 - 126 U/L   Total Bilirubin 0.7 0.3 - 1.2 mg/dL   GFR, Estimated 14 (L) >60 mL/min    Comment: (NOTE) Calculated using the CKD-EPI Creatinine Equation (2021)    Anion gap 5 5 - 15    Comment: Performed at [facility], [place] [address], [place], [place] [address]  CBC with Differential     Status: Abnormal   Collection Time: 05/05/22 10:43 AM  Result Value Ref Range   WBC 4.8 4.0 - 10.5 K/uL   RBC 4.55 3.87 - 5.11 MIL/uL   Hemoglobin 12.4 12.0 - 15.0 g/dL   HCT 41.6 36.0 - 46.0 %   MCV 91.4 80.0 - 100.0 fL   MCH 27.3 26.0 - 34.0 pg   MCHC 29.8 (L) 30.0 - 36.0 g/dL   RDW 18.0 (H) 11.5 - 15.5 %   Platelets 126 (L) 150 - 400 K/uL    Comment: REPEATED TO VERIFY   nRBC 0.0 0.0 - 0.2 %   Neutrophils Relative % 72 %   Neutro Abs 3.5 1.7 - 7.7 K/uL   Lymphocytes Relative 15 %   Lymphs Abs 0.7 0.7 - 4.0 K/uL   Monocytes Relative 11 %   Monocytes Absolute 0.5 0.1 - 1.0 K/uL   Eosinophils  Relative 1 %   Eosinophils Absolute 0.0 0.0 - 0.5 K/uL   Basophils Relative 1 %   Basophils Absolute 0.1 0.0 - 0.1 K/uL   Immature Granulocytes 0 %   Abs Immature Granulocytes 0.02 0.00 - 0.07 K/uL    Comment: Performed at [facility], [place] [address], [place], [place] [address]  Protime-INR     Status: Abnormal   Collection Time: 05/05/22 10:43 AM  Result Value Ref Range   Prothrombin Time 15.9 (H) 11.4 - 15.2 seconds   INR 1.3 (H) 0.8 - 1.2    Comment: (NOTE) INR goal varies based on device and disease states. Performed at [facility], [place] [address], [place], [place] [address]   APTT     Status: None   Collection Time: 05/05/22 10:43 AM  Result Value Ref Range   aPTT 34 24 - 36 seconds    Comment: Performed at [facility] [address] [place], [place] [address]  Ethanol     Status: None   Collection Time: 05/05/22 10:43 AM  Result Value Ref Range   Alcohol, Ethyl (B) <10 <10 mg/dL    Comment: (NOTE) Lowest detectable limit for serum alcohol is  10 mg/dL.  For medical purposes only. Performed at [facility], [place] [address], [place], [place] [address]   Urinalysis, Routine w reflex microscopic     Status: Abnormal   Collection Time: 05/05/22 10:43 AM  Result Value Ref Range   Color, Urine AMBER (A) YELLOW    Comment: BIOCHEMICALS MAY BE AFFECTED BY COLOR   APPearance CLOUDY (A) CLEAR   Specific Gravity, Urine 1.016 1.005 - 1.030   pH 7.0 5.0 - 8.0   Glucose, UA 50 (A) NEGATIVE mg/dL   Hgb urine dipstick NEGATIVE NEGATIVE   Bilirubin Urine NEGATIVE NEGATIVE   Ketones, ur NEGATIVE NEGATIVE mg/dL   Protein, ur >=300 (A) NEGATIVE mg/dL   Nitrite NEGATIVE NEGATIVE   Leukocytes,Ua LARGE (A) NEGATIVE   RBC / HPF 11-20 0 - 5 RBC/hpf   WBC, UA >50 (H) 0 - 5 WBC/hpf   Bacteria, UA MANY (A) NONE SEEN   Squamous Epithelial / LPF 0-5 0 - 5   WBC Clumps PRESENT    Amorphous Crystal PRESENT    Ca Oxalate Crys, UA PRESENT     Comment: Performed at  [facility], [address] [place], [place] [address]  CBG monitoring, ED     Status: Abnormal   Collection Time: 05/05/22  3:11 PM  Result Value Ref Range   Glucose-Capillary 103 (H) 70 - 99 mg/dL    Comment: Glucose reference range applies only to samples taken after fasting for at least 8 hours.  Urine rapid drug screen (hosp performed)     Status: None   Collection Time: 05/05/22  7:33 PM  Result Value Ref Range   Opiates NONE DETECTED NONE DETECTED   Cocaine NONE DETECTED NONE DETECTED   Benzodiazepines NONE DETECTED NONE DETECTED   Amphetamines NONE DETECTED NONE DETECTED   Tetrahydrocannabinol NONE DETECTED NONE DETECTED   Barbiturates NONE DETECTED NONE DETECTED    Comment: (NOTE) DRUG SCREEN FOR MEDICAL PURPOSES ONLY.  IF CONFIRMATION IS NEEDED FOR ANY PURPOSE, NOTIFY LAB WITHIN 5 DAYS.  LOWEST DETECTABLE LIMITS FOR URINE DRUG SCREEN Drug Class                     Cutoff (ng/mL) Amphetamine and metabolites    1000 Barbiturate and metabolites    200 Benzodiazepine                 200 Opiates and metabolites        300 Cocaine and metabolites        300 THC                            50 Performed at [facility], [place] [address] [place], [place] [address]   Lipid panel     Status: None   Collection Time: 05/06/22  6:08 AM  Result Value Ref Range   Cholesterol 99 0 - 200 mg/dL   Triglycerides 58 <150 mg/dL   HDL 47 >40 mg/dL   Total CHOL/HDL Ratio 2.1 RATIO   VLDL 12 0 - 40 mg/dL   LDL Cholesterol 40 0 - 99 mg/dL    Comment:        Total Cholesterol/HDL:CHD Risk Coronary Heart Disease Risk Table                     Men   Women  1/2 Average Risk   3.4   3.3  Average  Risk       5.0   4.4  2 X Average Risk   9.6   7.1  3 X Average Risk  23.4   11.0        Use the calculated Patient Ratio above and the CHD Risk Table to determine the patient's CHD Risk.        ATP III CLASSIFICATION (LDL):  <100     mg/dL   Optimal  100-129  mg/dL   Near or  Above                    Optimal  130-159  mg/dL   Borderline  160-189  mg/dL   High  >190     mg/dL   Very High Performed at [facility] [address], [place], [place] [address]   Hemoglobin A1c     Status: Abnormal   Collection Time: 05/06/22  6:08 AM  Result Value Ref Range   Hgb A1c MFr Bld 4.7 (L) 4.8 - 5.6 %    Comment: (NOTE) Pre diabetes:          5.7%-6.4%  Diabetes:              >6.4%  Glycemic control for   <7.0% adults with diabetes    Mean Plasma Glucose 88.19 mg/dL    Comment: Performed at [facility] [address], [place], [place] [address]   *Note: Due to a large number of results and/or encounters for the requested time period, some results have not been displayed. A complete set of results can be found in Results Review.   CT Head Wo Contrast  Result Date: 05/05/2022 CLINICAL DATA:  Neuro deficit, acute, stroke suspected EXAM: CT HEAD WITHOUT CONTRAST TECHNIQUE: Contiguous axial images were obtained from the base of the skull through the vertex without intravenous contrast. RADIATION DOSE REDUCTION: This exam was performed according to the departmental dose-optimization program which includes automated exposure control, adjustment of the mA and/or kV according to patient size and/or use of iterative reconstruction technique. COMPARISON:  07/23/2021 and previous FINDINGS: Brain: Diffuse parenchymal atrophy. Patchy areas of hypoattenuation in deep and periventricular white matter bilaterally. Negative for acute intracranial hemorrhage, mass lesion, acute infarction, midline shift, or mass-effect. Acute infarct may be inapparent on noncontrast CT. Ventricles and sulci symmetric. Vascular: Atherosclerotic and physiologic intracranial calcifications. Skull: Normal. Negative for fracture or focal lesion. Sinuses/Orbits: No acute finding. Other: None IMPRESSION: 1. Negative for bleed or other acute intracranial process. 2. Atrophy and nonspecific white matter  changes. Electronically Signed   By: Lucrezia Europe M.D.   On: 05/05/2022 11:15    Pending Labs Unresulted Labs (From admission, onward)     Start     Ordered   05/07/22 6834  Basic metabolic panel  Tomorrow morning,   R        05/06/22 1422   05/06/22 1052  Vitamin B12  (Dementia Panel (PNL))  Once,   R        05/06/22 1051   05/06/22 1052  Sedimentation rate  (Dementia Panel (PNL))  Once,   R        05/06/22 1051   05/06/22 1052  CBC  (Dementia Panel (PNL))  Once,   R        05/06/22 1051   05/06/22 1052  TSH  (Dementia Panel (PNL))  Once,   R        05/06/22 1051   05/06/22 1052  Folate  (Dementia Panel (PNL))  Once,   R        05/06/22 1051            Vitals/Pain Today's Vitals   05/06/22 1115 05/06/22 1415 05/06/22 1500 05/06/22 1537  BP: 138/81 (!) 146/89 (!) 151/89   Pulse: 74 75 73 69  Resp: (!) 22 (!) '23 19 16  '$ Temp:    97.7 F (36.5 C)  TempSrc:    Oral  SpO2: 99% 99% 100% 98%  PainSc:        Isolation Precautions No active isolations  Medications Medications  aspirin EC tablet 81 mg (81 mg Oral Given 05/06/22 0926)  atorvastatin (LIPITOR) tablet 20 mg (20 mg Oral Given 05/06/22 0926)  calcitonin (salmon) (MIACALCIN/FORTICAL) nasal spray 1 spray (1 spray Alternating Nares Given 05/06/22 0925)  carvedilol (COREG) tablet 3.125 mg (3.125 mg Oral Given 05/06/22 0926)  FLUoxetine (PROZAC) capsule 40 mg (40 mg Oral Given 05/06/22 0926)  furosemide (LASIX) tablet 80 mg (has no administration in time range)  mycophenolate (MYFORTIC) EC tablet 180 mg (180 mg Oral Given 05/06/22 0925)  oxyCODONE (Oxy IR/ROXICODONE) immediate release tablet 5 mg (has no administration in time range)  pantoprazole (PROTONIX) EC tablet 20 mg (20 mg Oral Given 05/06/22 0927)  tacrolimus (PROGRAF) capsule 1 mg (1 mg Oral Given 05/06/22 0925)  predniSONE (DELTASONE) tablet 5 mg (5 mg Oral Given 05/06/22 0927)  acetaminophen (TYLENOL) tablet 650 mg (has no administration in time range)     Or  acetaminophen (TYLENOL) 160 MG/5ML solution 650 mg (has no administration in time range)    Or  acetaminophen (TYLENOL) suppository 650 mg (has no administration in time range)  senna-docusate (Senokot-S) tablet 1 tablet (has no administration in time range)  cefTRIAXone (ROCEPHIN) 1 g in sodium chloride 0.9 % 100 mL IVPB (has no administration in time range)  cefTRIAXone (ROCEPHIN) 1 g in sodium chloride 0.9 % 100 mL IVPB (0 g Intravenous Stopped 05/05/22 2327)   stroke: early stages of recovery book ( Does not apply Given 05/06/22 0927)    Mobility walks with device Moderate fall risk   Focused Assessments Cardiac Assessment Handoff:  Cardiac Rhythm: Normal sinus rhythm Lab Results  Component Value Date   CKTOTAL 374 (H) 03/26/2012   CKMB 2.8 03/26/2012   TROPONINI 0.38 (HH) 02/04/2018   Lab Results  Component Value Date   DDIMER 0.98 (H) 03/13/2020   Does the Patient currently have chest pain? No   , Neuro Assessment Handoff:  Swallow screen pass? Yes  Cardiac Rhythm: Normal sinus rhythm NIH Stroke Scale ( + Modified Stroke Scale Criteria)  Interval: Shift assessment Level of Consciousness (1a.)   : Alert, keenly responsive LOC Questions (1b. )   +: Answers both questions correctly LOC Commands (1c. )   + : Performs both tasks correctly Best Gaze (2. )  +: Normal Visual (3. )  +: No visual loss Facial Palsy (4. )    : Normal symmetrical movements Motor Arm, Left (5a. )   +: Drift Motor Arm, Right (5b. )   +: Drift Motor Leg, Left (6a. )   +: Drift Motor Leg, Right (6b. )   +: Drift Limb Ataxia (7. ): Absent Sensory (8. )   +: Mild-to-moderate sensory loss, patient feels pinprick is less sharp or is dull on the affected side, or there is a loss of superficial pain with pinprick, but patient is aware of being touched Best Language (9. )   +:  No aphasia Dysarthria (10. ): Normal Extinction/Inattention (11.)   +: No Abnormality Modified SS Total  +: 5 Complete  NIHSS TOTAL: 5     Neuro Assessment: Exceptions to WDL Neuro Checks:   Initial (05/05/22 1515)  Last Documented NIHSS Modified Score: 5 (05/06/22 1411) Has TPA been given? No If patient is a Neuro Trauma and patient is going to OR before floor call report to [facility] nurse: [phone] or [phone]  , Pulmonary Assessment Handoff:  Lung sounds:   O2 Device: Room Air      R Recommendations: See Admitting Provider Note  Report given to:   Additional Notes:

## 2022-05-06 NOTE — Evaluation (Signed)
Speech Language Pathology Evaluation Patient Details Name: [name] MRN: [id number] DOB: [birth year] Today's Date: 05/06/2022 Time: 4315-4008 SLP Time Calculation (min) (ACUTE ONLY): 25 min  Problem List:  Patient Active Problem List   Diagnosis Date Noted   TIA (transient ischemic attack) 05/05/2022   Compression fracture of L1 lumbar vertebra ([place]) 04/26/2022   Compression fracture of L2 lumbar vertebra ([place]) 04/26/2022   Vitamin D deficiency 04/26/2022   Episodic memory loss 04/19/2022   Age-related osteoporosis with current pathological fracture 04/19/2022   Protein-calorie malnutrition ([place]) 03/30/2022   ABLA (acute blood loss anemia) 01/19/2022   Bilateral renal cysts 01/19/2022   At high risk for falls 01/11/2022   Hematoma 01/11/2022   Right lower quadrant abdominal mass 12/25/2021   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   Renal transplant recipient 08/13/2021   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   Immunocompromised patient ([place]) 08/13/2021   Chronic atrial fibrillation (HCC)    Right leg weakness 07/23/2021   Erosive esophagitis/esophageal ulceration/duodenal ulceration 07/23/2021   Edema of right lower leg 07/23/2021   Acute GI bleeding 07/08/2021   Azotemia 07/08/2021   Pressure injury of skin 07/08/2021   Asthma, chronic, unspecified asthma severity, with acute exacerbation 06/15/2021   Acute on chronic combined systolic and diastolic CHF (congestive heart failure) ([place]) 06/15/2021   Duodenal ulcer 03/29/2021   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   Gastroesophageal reflux disease 06/29/2020   Iron deficiency anemia 06/29/2020   Traumatic seroma of lower leg, right, subsequent encounter 01/28/2020   Other secondary pulmonary hypertension ([place]) 12/03/2019   Anticoagulation management encounter 11/04/2019   Pacemaker 04/18/2018   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   Personal history of PE (pulmonary embolism) 02/22/2018    Chronic systolic heart failure ([place]) 02/11/2018   Hyperkalemia 02/03/2018   Lower extremity edema 12/10/2017   Presence of Watchman left atrial appendage closure device 10/04/2017   Dyspnea 07/26/2017   Atrial flutter ([place])    Oligouria    Hypertension    Fractures, stress    Eczema    Depression    Constipation    Arthritis    Exacerbation of asthma 04/01/2017   Closed fracture of right distal radius 10/18/2016   Proteinuria 09/04/2016   History of kidney transplant 2017 07/25/2015   Immunosuppression ([place]) 07/25/2015   Nonischemic dilated cardiomyopathy ([place])    S/P insertion of IVC (inferior vena caval) filter 04/07/2013   Use of cane as ambulatory aid 04/07/2013   S/P hip replacement 03/25/2012   Personal history of DVT/PE s/p IVC filter 12/08/2011   Obstructive sleep apnea 12/08/2011   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[place]) 10/24/2011   CKD (chronic kidney disease), stage V ([place]) 09/14/2011   Hypercalcemia    Gout 09/10/2011   Acute blood loss anemia secondary to diverticular bleed  09/04/2011   Absence of right hip joint with antibiotic pacer 09/03/2011   Traumatic seroma of thigh ([place]) 06/11/2011   Clostridium difficile colitis 06/09/2011   Symptomatic anemia 06/06/2011   SINUSITIS- ACUTE-NOS 09/27/2010   Atrial fibrillation s/p AVN ablation/PPM implant, s/p LAAC with Watchman 11/25/2008   Cerebral artery occlusion with cerebral infarction ([place]) 09/28/2008   History of CVA with residual deficit 09/28/2008   Hemiplegia ([place]) 09/21/2008   Psoriasis 10/20/2007   Asthmatic bronchitis without complication 67/61/9509   Past Medical History:  Past Medical History:  Diagnosis Date   Acute diastolic (congestive) heart failure ([place]) 08/12/2017   Acute GI bleeding 11/14/2019   Acute kidney injury superimposed on  chronic kidney disease ([place]) 11/17/2017   AKI (acute kidney injury) ([place]) 11/04/2017   Anemia    Anxiety    ARF (acute renal failure) ([place]) 06/06/2011   Arthritis     "shoulders; arms; hips" (02/04/2018)   Asthma    Atrial fibrillation (HCC)    Atrial flutter ([place])    s/p aflutter ablation at [facility]   Bacteriuria, asymptomatic 11/14/2020   Benign hypertension with ESRD (end-stage renal disease) ([place])    Blood transfusion    never had a reaction to blood transfusion   Cardiomyopathy Mar 2013   Mild, EF 50-55% by Mar 2013 ECHO, diast dysfxn II   Cardiomyopathy ([place])    CHF (congestive heart failure) ([place]) 2005   CHF (congestive heart failure) ([place])    Childhood asthma    Closed fracture of right distal radius 10/18/2016   CMV (cytomegalovirus infection) ([place]) 10/03/2015   Constipation    takes Miralax daily   Constipation    Depression    takes Zoloft daily   Distal radius fracture, right    DVT of lower extremity, bilateral ([place]) 12/21/11   "they're there now; been there for 2 wks"   Eczema    End stage renal disease ([place]) 11/06/2011   ESRD (end stage renal disease) on dialysis [facility]) 09/2011   07-19-2015 had Kidney transplant at [facility]; "don't get dialysis anymore" (02/04/2018)   Essential hypertension 07/07/2007   Qualifier: Diagnosis of  By: Garen Grams     Fractures, stress    in both feet--6 OR 7 YRS AGO--RESOLVED   Generalized edema [id number]   GI bleed 11/14/2019   Gout    doesn't require meds    HCAP (healthcare-associated pneumonia) 09/10/2011   Hearing difficulty    Hematoma of left lower leg 05/17/2020   High cholesterol    History of CVA (cerebrovascular accident) without residual deficits    History of hip replacement, total, right    History of pneumonia    Hx of Clostridium difficile infection    Hypercalcemia    09/10/11   Hyperparathyroidism ([place]) 04/07/2013   Hypertension    takes Diltiazem daily    Hypomagnesemia 08/02/2015   Hypophosphatemia 11/08/2017   ICB (intracranial bleed) ([place])    Immunosuppression ([place]) 07/25/2015   Memory changes    Morbid obesity ([place])    Nonischemic dilated cardiomyopathy ([place])    Obesity  04/07/2013   Oligouria    Oral mucositis 02/22/2018   OSA on CPAP    PAF (paroxysmal atrial fibrillation) (HCC)     HX OF CEREBRAL BLEED WHILE ON COUMADIN-SO PT NOT ON ANY BLOOD THINNERS NOW   Peripheral vascular disease ([place])    Persistent atrial fibrillation ([place]) 08/12/2017   Overview:  Added automatically from request for surgery [id number]  Formatting of this note might be different from the original. Added automatically from request for surgery [id number]   Presence of Watchman left atrial appendage closure device 10/04/2017   Proteinuria 09/04/2016   Psoriasis 08/07/2016   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/25/2015   S/P insertion of IVC (inferior vena caval) filter    Sepsis ([place]) 11/08/2017   Stress fracture    bilateral feet   Stroke ([place]) 2009   denies residual on 02/04/2018;  hemorrhagic now off coumadin   Stroke ([place]) 07/17/2007   denies residual, hemorrhagic now off coumadin   Stroke due to intracerebral hemorrhage ([place]) 2009   Suture reaction 09/04/2016   Transfusion history    Use of cane as ambulatory aid  Past Surgical History:  Past Surgical History:  Procedure Laterality Date   AV FISTULA PLACEMENT  09/28/2011   Procedure: ARTERIOVENOUS (AV) FISTULA CREATION;  Surgeon: Rosetta Posner, MD;  Location: [place];  Service: Vascular;  Laterality: Left;   BIOPSY  07/10/2021   Procedure: BIOPSY;  Surgeon: Irving Copas., MD;  Location: [facility];  Service: Gastroenterology;;   CARDIAC CATHETERIZATION     CARDIAC VALVE [place] DEFECT   CHOLECYSTECTOMY     1980's   COLONOSCOPY     COLONOSCOPY WITH PROPOFOL N/A 11/16/2019   Procedure: COLONOSCOPY WITH PROPOFOL;  Surgeon: Juanita Craver, MD;  Location: [facility] ENDOSCOPY;  Service: Endoscopy;  Laterality: N/A;   CYSTOSCOPY     many yrs ago   ESOPHAGOGASTRODUODENOSCOPY N/A 07/10/2021   Procedure: ESOPHAGOGASTRODUODENOSCOPY (EGD);  Surgeon: Irving Copas., MD;  Location: [facility];  Service:  Gastroenterology;  Laterality: N/A;   ESOPHAGOGASTRODUODENOSCOPY (EGD) WITH PROPOFOL N/A 11/16/2019   Procedure: ESOPHAGOGASTRODUODENOSCOPY (EGD) WITH PROPOFOL;  Surgeon: Juanita Craver, MD;  Location: [facility] ENDOSCOPY;  Service: Endoscopy;  Laterality: N/A;   ESOPHAGOGASTRODUODENOSCOPY (EGD) WITH PROPOFOL N/A 03/20/2021   Procedure: ESOPHAGOGASTRODUODENOSCOPY (EGD) WITH PROPOFOL;  Surgeon: Milus Banister, MD;  Location: [facility] ENDOSCOPY;  Service: Endoscopy;  Laterality: N/A;   FEMUR FRACTURE SURGERY Right 03/2011; 09/03/2011   "had 2, 2 wk apart in 2012; broke it again 08/2011 & had OR"   [place] N/A 07/10/2021   Procedure: FLEXIBLE SIGMOIDOSCOPY;  Surgeon: Rush Landmark Telford Nab., MD;  Location: [facility];  Service: Gastroenterology;  Laterality: N/A;   FRACTURE SURGERY     GIVENS CAPSULE STUDY N/A 07/11/2021   Procedure: GIVENS CAPSULE STUDY;  Surgeon: Juanita Craver, MD;  Location: [facility] ENDOSCOPY;  Service: Endoscopy;  Laterality: N/A;   HEMOSTASIS CONTROL  03/20/2021   Procedure: HEMOSTASIS CONTROL;  Surgeon: Milus Banister, MD;  Location: [place];  Service: Endoscopy;;   HOT HEMOSTASIS N/A 03/20/2021   Procedure: HOT HEMOSTASIS (ARGON PLASMA COAGULATION/BICAP);  Surgeon: Milus Banister, MD;  Location: [facility] ENDOSCOPY;  Service: Endoscopy;  Laterality: N/A;   HOT HEMOSTASIS N/A 07/10/2021   Procedure: HOT HEMOSTASIS (ARGON PLASMA COAGULATION/BICAP);  Surgeon: Irving Copas., MD;  Location: [facility];  Service: Gastroenterology;  Laterality: N/A;   I & D EXTREMITY  09/15/2011   Procedure: IRRIGATION AND DEBRIDEMENT EXTREMITY w REMOVAL OF HARDWARE;  Surgeon: [name], MD;  Location: [place];  Service: Orthopedics;  Laterality: Right;   INCISION AND DRAINAGE HIP  12/21/2011   Procedure: IRRIGATION AND DEBRIDEMENT HIP;  Surgeon: [name], MD;  Location: [place];  Service: Orthopedics;  Laterality: Right;  I&D RIGHT HIP WITH PLACEMENT ANTIBIOTIC SPACER   INSERTION OF DIALYSIS  CATHETER     Procedure: INSERTION OF DIALYSIS CATHETER;  Surgeon: Rosetta Posner, MD;  Location: [place];  Service: Vascular;  Laterality: Right;   IRRIGATION AND DEBRIDEMENT ABSCESS  12/21/11   right hip   JOINT REPLACEMENT     KIDNEY TRANSPLANT  07/19/15   LAPAROSCOPIC GASTRIC BANDING  2008   LEFT ATRIAL APPENDAGE OCCLUSION  10/04/2017   OPEN REDUCTION INTERNAL FIXATION (ORIF) DISTAL RADIAL FRACTURE Right 10/18/2016   Procedure: OPEN REDUCTION INTERNAL FIXATION (ORIF) RIGHT DISTAL RADIAL FRACTURE;  Surgeon: Marchia Bond, MD;  Location: [place];  Service: Orthopedics;  Laterality: Right;   SUBMUCOSAL TATTOO INJECTION  07/10/2021   Procedure: SUBMUCOSAL TATTOO INJECTION;  Surgeon: Rush Landmark Telford Nab., MD;  Location: [facility];  Service: Gastroenterology;;   TOTAL HIP ARTHROPLASTY Right 02/2011   right THA 02/2011, I&D/removal of hardware 09/2011,, repeat I&D Jun 2013, reimplantation R THA 03-26-2012   TOTAL HIP REVISION  03/25/2012   Procedure: TOTAL HIP REVISION;  Surgeon: [name], MD;  Location: [facility];  Service: Orthopedics;  Laterality: Right;  Right Total Hip Reimplantation   TUBAL LIGATION     VENA CAVA FILTER PLACEMENT  11/2011   HPI:  76 y/o female admitted secondary to increased R weakness and difficulty walking. Awaiting MRI. PMH includes a fib, dementia, HTN, ESRD s/p renal transplant and PE   Assessment / Plan / Recommendation Clinical Impression  Pt with a hx of expressive aphasia and cogntiive deficits from prior stroke this past January and also has a hx of dementia. Suspect pts cognitive linguistics to be at baseline functioning however no family member present to determine. This date, pt was primarily fluent with speech at the phrase and short sentence level. She did have some episodic moments of expressive aphasia including paraphasias and perseverations that she was inconsistently aware of. Cognitive deficits appear moderate in nature including some  temporal disorientation, reduced safey awareness, decreased recall, and reduced executive function skills. Pt states she lives with spouse at baseline and has a daughter locally. No further acute ST needs warranted as pt likely close to baseline. Further assessment at next level of care for cognitive linguistics likely warranted, SNF.    SLP Assessment  SLP Recommendation/Assessment: All further Speech Lanaguage Pathology  needs can be addressed in the next venue of care SLP Visit Diagnosis: Aphasia (R47.01);Cognitive communication deficit (R41.841)    Recommendations for follow up therapy are one component of a multi-disciplinary discharge planning process, led by the attending physician.  Recommendations may be updated based on patient status, additional functional criteria and insurance authorization.    Follow Up Recommendations  Skilled nursing-short term rehab (<3 hours/day)    Assistance Recommended at Discharge  Frequent or constant Supervision/Assistance  Functional Status Assessment Patient has had a recent decline in their functional status and/or demonstrates limited ability to make significant improvements in function in a reasonable and predictable amount of time  Frequency and Duration           SLP Evaluation Cognition  Overall Cognitive Status: No family/caregiver present to determine baseline cognitive functioning (hx of cogniitive deficits and aphasia, suspect at baseline) Arousal/Alertness: Awake/alert Orientation Level: Oriented to place;Oriented to time;Oriented to person;Disoriented to situation Attention: Sustained;Selective Sustained Attention: Impaired Sustained Attention Impairment: Verbal complex;Functional complex Selective Attention: Impaired Selective Attention Impairment: Verbal complex;Functional complex Memory: Impaired Memory Impairment: Decreased short term memory;Decreased recall of new information Awareness: Impaired Executive Function:  Organizing;Sequencing Sequencing: Impaired Sequencing Impairment: Verbal complex;Functional complex Safety/Judgment: Impaired       Comprehension  Auditory Comprehension Overall Auditory Comprehension: Appears within functional limits for tasks assessed    Expression Verbal Expression Overall Verbal Expression: Impaired at baseline Initiation: No impairment Level of Generative/Spontaneous Verbalization: Conversation Repetition: Impaired Level of Impairment: Sentence level Naming: No impairment Written Expression Dominant Hand: Right   Oral / Motor  Oral Motor/Sensory Function Overall Oral Motor/Sensory Function: Within functional limits Motor Speech Overall Motor Speech: Appears within functional limits for tasks assessed            Hayden Rasmussen MA, CCC-SLP Acute Rehabilitation Services   05/06/2022, 10:14 AM

## 2022-05-06 NOTE — Progress Notes (Signed)
  Progress Note   Patient: [name] WUJ:[id number] DOB: [birth year] DOA: 05/05/2022     0 DOS: the patient was seen and examined on 05/06/2022   Brief hospital course: [name] was admitted to the hospital with the working diagnosis of acute CVA   76 yo female with the past medical history of recent CVA, atrial fibrillation and GI bleed sp watchman procedure, who presented with right sided weakness. She is sp renal transplant, has history of DVT and PE but has been off anticoagulation due to GI bleeding. Reported 2 weeks of feeling week, more right than left. On her initial physical examination her blood pressure was 150/91, HR 71 and RR 21, 02 saturation 94%, hear with S1 and S2 present and regular, respiratory with no wheezing, abdomen not distended, right lower extremity edema. Decreased strength 3/5 on the right compared to left.   Head CT negative for acute changes.   Assessment and Plan: * TIA (transient ischemic attack) Patient with persistent right sided weakness  Plan to continue neuro checks per unit protocol Follow up with MRI  Continue with aspirin and atorvastatin   CVA (cerebral vascular accident) ([place]) History of prior CVA Continue work up as above. Patient off anticoagulation due to GI bleed.   Atrial fibrillation s/p AVN ablation/PPM implant, s/p LAAC with Watchman Continue rate control with carvedilol No anticoagulation due to GI bleeding in the past.   CKD (chronic kidney disease), stage V (HCC) Renal function with serum cr at 3,41 with K at 4,1 and serum bicarbonate at 25.  Sp renal transplant, continue with prednisone, mycophenolate, and tracrolimus   Urinary tract infection, continue antibiotic therapy with ceftriaxone   Personal history of DVT/PE s/p IVC filter No anticoagulation due to GI bleeding   Asthma, chronic, unspecified asthma severity, with acute exacerbation No clinical signs of exacerbation   Gastroesophageal reflux disease Continue  with PPI         Subjective: Patient is feeling better, no dyspnea or chest pain   Physical Exam: Vitals:   05/06/22 1537 05/06/22 1545 05/06/22 1700 05/06/22 1949  BP:  (!) 151/85 (!) 145/81 (!) 147/89  Pulse: 69 69 70 74  Resp: 16 (!) 24 (!) 22 20  Temp: 97.7 F (36.5 C)  97.6 F (36.4 C) (!) 97.2 F (36.2 C)  TempSrc: Oral  Oral Oral  SpO2: 98% 99% 99% 97%   Neurology awake and alert ENT with mild pallor Cardiovascular with S1 and S2 present with no rubs or gallops Respiratory with no rales or wheezing Abdomen with no distention Right lower extremity edema  Data Reviewed:    Family Communication: I spoke with patient's husband at the bedside, we talked in detail about patient's condition, plan of care and prognosis and all questions were addressed.   Disposition: Status is: Inpatient Remains inpatient appropriate because: CVA pending MRI   Planned Discharge Destination: Home  Author: Tawni Millers, MD 05/06/2022 11:36 PM  For on call review [email or web address].

## 2022-05-06 NOTE — Progress Notes (Signed)
[name] arrived to [id number]. Husband at bedside. Connected to telemetry. Oriented to room, call light and bed controls. Call light within reach, bed in lowest position.

## 2022-05-06 NOTE — Assessment & Plan Note (Signed)
History of prior CVA Continue work up as above. Patient off anticoagulation due to GI bleed.

## 2022-05-06 NOTE — Assessment & Plan Note (Signed)
Continue rate control with carvedilol No anticoagulation due to GI bleeding in the past.

## 2022-05-06 NOTE — TOC Initial Note (Signed)
Transition of Care [facility]) - Initial/Assessment Note    Patient Details  Name: [name] MRN: [id number] Date of Birth: 10-03-1945  Transition [facility]) CM/SW Contact:    Verdell Carmine, RN Phone Number: 05/06/2022, 2:55 PM  Clinical Narrative:                  Presented at bedside to discuss discharge planning. Husband at bedside. Introduced self and explained role. Discussed SNF vs HH , patient at first agreeable to SNF and then when husband re-explained it, declined to go to SNF, however she is confused to time and situation. She has been at [facility] before as well as [name]. Daughter Kia on conference call and  explained information to her as well. They are going to talk amongst themselves to make a decision, but overall feel that SNF is the best to do.  TOC will follow for needs, recommendations, and transitions of care. Expected Discharge Plan: Skilled Nursing Facility Barriers to Discharge: Continued Medical Work up   Patient Goals and CMS Choice        Expected Discharge Plan and Services Expected Discharge Plan: [place]   Discharge Planning Services: CM Consult   Living arrangements for the past 2 months: Single Family Home                                      Prior Living Arrangements/Services Living arrangements for the past 2 months: Single Family Home Lives with:: Spouse Patient language and need for interpreter reviewed:: Yes        Need for Family Participation in Patient Care: Yes (Comment) Care giver support system in place?: Yes (comment)   Criminal Activity/Legal Involvement Pertinent to Current Situation/Hospitalization: No - Comment as needed  Activities of Daily Living      Permission Sought/Granted                  Emotional Assessment Appearance:: Appears older than stated age Attitude/Demeanor/Rapport: Guarded Affect (typically observed): Irritable Orientation: : Fluctuating Orientation  (Suspected and/or reported Sundowners) Alcohol / Substance Use: Not Applicable Psych Involvement: No (comment)  Admission diagnosis:  TIA (transient ischemic attack) [G45.9] Patient Active Problem List   Diagnosis Date Noted   TIA (transient ischemic attack) 05/05/2022   Compression fracture of L1 lumbar vertebra ([place]) 04/26/2022   Compression fracture of L2 lumbar vertebra ([place]) 04/26/2022   Vitamin D deficiency 04/26/2022   Episodic memory loss 04/19/2022   Age-related osteoporosis with current pathological fracture 04/19/2022   Protein-calorie malnutrition ([place]) 03/30/2022   ABLA (acute blood loss anemia) 01/19/2022   Bilateral renal cysts 01/19/2022   At high risk for falls 01/11/2022   Hematoma 01/11/2022   Right lower quadrant abdominal mass 12/25/2021   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/14/2021   Renal transplant recipient 08/13/2021   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   Immunocompromised patient ([place]) 08/13/2021   Chronic atrial fibrillation ([place])    Right leg weakness 07/23/2021   Erosive esophagitis/esophageal ulceration/duodenal ulceration 07/23/2021   Edema of right lower leg 07/23/2021   Acute GI bleeding 07/08/2021   Azotemia 07/08/2021   Pressure injury of skin 07/08/2021   Asthma, chronic, unspecified asthma severity, with acute exacerbation 06/15/2021   Acute on chronic combined systolic and diastolic CHF (congestive heart failure) ([place]) 06/15/2021   Duodenal ulcer 03/29/2021   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  Gastroesophageal reflux disease 06/29/2020   Iron deficiency anemia 06/29/2020   Traumatic seroma of lower leg, right, subsequent encounter 01/28/2020   Other secondary pulmonary hypertension ([place]) 12/03/2019   Anticoagulation management encounter 11/04/2019   Pacemaker 04/18/2018   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   Personal history of PE (pulmonary embolism) 48/27/0786   Chronic systolic heart failure ([place])  02/11/2018   Hyperkalemia 02/03/2018   Lower extremity edema 12/10/2017   Presence of Watchman left atrial appendage closure device 10/04/2017   Dyspnea 07/26/2017   Atrial flutter ([place])    Oligouria    Hypertension    Fractures, stress    Eczema    Depression    Constipation    Arthritis    Exacerbation of asthma 04/01/2017   Closed fracture of right distal radius 10/18/2016   Proteinuria 09/04/2016   History of kidney transplant 2017 07/25/2015   Immunosuppression ([place]) 07/25/2015   Nonischemic dilated cardiomyopathy (HCC)    S/P insertion of IVC (inferior vena caval) filter 04/07/2013   Use of cane as ambulatory aid 04/07/2013   S/P hip replacement 03/25/2012   Personal history of DVT/PE s/p IVC filter 12/08/2011   Obstructive sleep apnea 12/08/2011   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[place]) 10/24/2011   CKD (chronic kidney disease), stage V ([place]) 09/14/2011   Hypercalcemia    Gout 09/10/2011   Acute blood loss anemia secondary to diverticular bleed  09/04/2011   Absence of right hip joint with antibiotic pacer 09/03/2011   Traumatic seroma of thigh ([place]) 06/11/2011   Clostridium difficile colitis 06/09/2011   Symptomatic anemia 06/06/2011   SINUSITIS- ACUTE-NOS 09/27/2010   Atrial fibrillation s/p AVN ablation/PPM implant, s/p LAAC with Watchman 11/25/2008   Cerebral artery occlusion with cerebral infarction ([place]) 09/28/2008   History of CVA with residual deficit 09/28/2008   Hemiplegia ([place]) 09/21/2008   Psoriasis 10/20/2007   Asthmatic bronchitis without complication 75/44/9201   PCP:  Pcp, No Pharmacy:   [id number] [address]Phone: [phone]: [phone] [address] [place] [address]Phone: [phone]: [phone]    Social Determinants of Health (SDOH)  Interventions    Readmission Risk Interventions    07/13/2021    9:48 AM 11/18/2019   10:19 AM  Readmission Risk Prevention Plan  Transportation Screening Complete Complete  PCP or Specialist Appt within 3-5 Days  Complete  HRI or [place] Complete  Social Work Consult for [place]anning/Counseling  Complete  Palliative Care Screening  Not Applicable  Medication Review (Press photographer Complete Complete  HRI or [place]   SW Recovery Care/Counseling Consult Complete   Palliative Care Screening Not [place]chidlands Estates[place] Applicable

## 2022-05-07 ENCOUNTER — Telehealth: Payer: Self-pay

## 2022-05-07 DIAGNOSIS — I480 Paroxysmal atrial fibrillation: Secondary | ICD-10-CM | POA: Diagnosis not present

## 2022-05-07 DIAGNOSIS — I639 Cerebral infarction, unspecified: Secondary | ICD-10-CM | POA: Diagnosis not present

## 2022-05-07 DIAGNOSIS — N185 Chronic kidney disease, stage 5: Secondary | ICD-10-CM | POA: Diagnosis not present

## 2022-05-07 DIAGNOSIS — G459 Transient cerebral ischemic attack, unspecified: Secondary | ICD-10-CM | POA: Diagnosis not present

## 2022-05-07 LAB — IMMUNOFIXATION ELECTROPHORESIS
IgA/Immunoglobulin A, Serum: 449 mg/dL — ABNORMAL HIGH (ref 64–422)
IgG (Immunoglobin G), Serum: 1553 mg/dL (ref 586–1602)
IgM (Immunoglobulin M), Srm: 60 mg/dL (ref 26–217)
Total Protein: 6.6 g/dL (ref 6.0–8.5)

## 2022-05-07 LAB — BASIC METABOLIC PANEL
Anion gap: 12 (ref 5–15)
BUN: 55 mg/dL — ABNORMAL HIGH (ref 8–23)
CO2: 19 mmol/L — ABNORMAL LOW (ref 22–32)
Calcium: 9.4 mg/dL (ref 8.9–10.3)
Chloride: 107 mmol/L (ref 98–111)
Creatinine, Ser: 3.33 mg/dL — ABNORMAL HIGH (ref 0.44–1.00)
GFR, Estimated: 14 mL/min — ABNORMAL LOW (ref >60)
Glucose, Bld: 81 mg/dL (ref 70–99)
Potassium: 5.1 mmol/L (ref 3.5–5.1)
Sodium: 138 mmol/L (ref 135–145)

## 2022-05-07 LAB — PROTEIN ELECTROPHORESIS, SERUM
A/G Ratio: 1.1 (ref 0.7–1.7)
Albumin ELP: 3.4 g/dL (ref 2.9–4.4)
Alpha 1: 0.3 g/dL (ref 0.0–0.4)
Alpha 2: 0.6 g/dL (ref 0.4–1.0)
Beta: 0.8 g/dL (ref 0.7–1.3)
Gamma Globulin: 1.6 g/dL (ref 0.4–1.8)
Globulin, Total: 3.2 g/dL (ref 2.2–3.9)

## 2022-05-07 LAB — PTH, INTACT AND CALCIUM
Calcium: 9.9 mg/dL (ref 8.7–10.3)
PTH: 57 pg/mL (ref 15–65)

## 2022-05-07 LAB — VITAMIN D 25 HYDROXY (VIT D DEFICIENCY, FRACTURES): Vit D, 25-Hydroxy: 32.6 ng/mL (ref 30.0–100.0)

## 2022-05-07 NOTE — Progress Notes (Signed)
  Progress Note   Patient: [name] IRC:[id number] DOB: 1946-03-01 DOA: 05/05/2022     1 DOS: the patient was seen and examined on 05/07/2022   Brief hospital course: Mrs. Greening was admitted to the hospital with the working diagnosis of acute CVA   76 yo female with the past medical history of recent CVA, atrial fibrillation and GI bleed sp watchman procedure, who presented with right sided weakness. She is sp renal transplant, has history of DVT and PE but has been off anticoagulation due to GI bleeding. Reported 2 weeks of feeling week, more right than left. On her initial physical examination her blood pressure was 150/91, HR 71 and RR 21, 02 saturation 94%, hear with S1 and S2 present and regular, respiratory with no wheezing, abdomen not distended, right lower extremity edema. Decreased strength 3/5 on the right compared to left.   Head CT negative for acute changes.   Assessment and Plan: * TIA (transient ischemic attack) Patient with persistent right sided weakness Pending brain MRI  Plan to continue neuro checks per unit protocol Continue with aspirin and atorvastatin   Patient will need SNF at the time of her discharge.    CVA (cerebral vascular accident) [facility]) History of prior CVA Continue work up as above. Patient off anticoagulation due to GI bleed.   Atrial fibrillation s/p AVN ablation/PPM implant, s/p LAAC with Watchman Continue rate control with carvedilol No anticoagulation due to GI bleeding in the past.   CKD (chronic kidney disease), stage V (HCC) Stable renal function with serum cr at 3,3 with K at 5,1 and serum bicarbonate at 19.   Sp renal transplant, continue with prednisone, mycophenolate, and tracrolimus   Urinary tract infection, continue antibiotic therapy with ceftriaxone  Will plan for a total of 3 days.   Personal history of DVT/PE s/p IVC filter No anticoagulation due to GI bleeding   Asthma, chronic, unspecified asthma severity, with  acute exacerbation No clinical signs of exacerbation   Gastroesophageal reflux disease Continue with PPI         Subjective: Patient with no dyspnea or chest pain. Persistent right sided weakness   Physical Exam: Vitals:   05/06/22 1949 05/06/22 2352 05/07/22 0341 05/07/22 0800  BP: (!) 147/89 137/86 (!) 145/86 (!) 150/83  Pulse: 74 73 74 70  Resp: '20 20 20 18  '$ Temp: (!) 97.2 F (36.2 C) 97.7 F (36.5 C) 97.6 F (36.4 C) 97.9 F (36.6 C)  TempSrc: Oral Oral Oral Oral  SpO2: 97% 100% 99% 95%   Neurology awake and alert ENT with mild pallor Cardiovascular with S1 and S2 present and rhythmic with no gallops Respiratory with no rales or wheezing Abdomen with no distention  Positive right lower extremity edema  Data Reviewed:    Family Communication: no family at the bedside   Disposition: Status is: Inpatient Remains inpatient appropriate because: pending brain MRI and final neurology recommendations   Planned Discharge Destination: Skilled nursing facility    Author: Tawni Millers, MD 05/07/2022 2:55 PM  For on call review [email or web address].

## 2022-05-07 NOTE — TOC Progression Note (Signed)
Transition of Care [facility]) - Progression Note    Patient Details  Name: [name] MRN: [id number] Date of Birth: [birth year]  Transition of Care [facility]) CM/SW [place], [place] Work Phone Number: 05/07/2022, 1:27 PM  Clinical Narrative:    MSW intern spoke with patient's husband, [name] regarding discharge planning. [name] reports the family is in agreement with patient going to SNF and has requested [facility].    Expected Discharge Plan: [place] Barriers to Discharge: Continued Medical Work up  Expected Discharge Plan and Services Expected Discharge Plan: [place]   Discharge Planning Services: CM Consult   Living arrangements for the past 2 months: Single Family Home                                       Social Determinants of Health (SDOH) Interventions    Readmission Risk Interventions    07/13/2021    9:48 AM 11/18/2019   10:19 AM  Readmission Risk Prevention Plan  Transportation Screening Complete Complete  PCP or Specialist Appt within 3-5 Days  Complete  HRI or [place]  Complete  Social Work Consult for [place] Planning/Counseling  Complete  Palliative Care Screening  Not Applicable  Medication Review Press photographer) Complete Complete  HRI or [place] Complete   SW Recovery Care/Counseling Consult Complete   [place] Not Applicable

## 2022-05-07 NOTE — Telephone Encounter (Signed)
Caller Name: [employer] Call back phone #: [phone]  Reason for Call: Is currently in the hospital. Would like a call back regarding the appt set for 10/17. There was a conflict with a sports medicine appt.

## 2022-05-07 NOTE — Telephone Encounter (Signed)
Called & spoke w/ Mr. Rojelio Brenner. Says they were seen at sports medicine and imaging on the same day and time as the appt that was here.

## 2022-05-08 ENCOUNTER — Ambulatory Visit: Payer: Medicare Other | Admitting: Psychology

## 2022-05-08 ENCOUNTER — Telehealth: Payer: Self-pay | Admitting: Family Medicine

## 2022-05-08 ENCOUNTER — Encounter (HOSPITAL_COMMUNITY): Payer: Self-pay | Admitting: Internal Medicine

## 2022-05-08 DIAGNOSIS — E440 Moderate protein-calorie malnutrition: Secondary | ICD-10-CM | POA: Insufficient documentation

## 2022-05-08 DIAGNOSIS — R768 Other specified abnormal immunological findings in serum: Secondary | ICD-10-CM

## 2022-05-08 MED ORDER — RENA-VITE PO TABS
1.0000 | ORAL_TABLET | Freq: Every day | ORAL | Status: DC
  Administered 2022-05-08 – 2022-05-09 (×2): 1 via ORAL
  Filled 2022-05-08 (×2): qty 1

## 2022-05-08 MED ORDER — NEPRO/CARBSTEADY PO LIQD
237.0000 mL | Freq: Three times a day (TID) | ORAL | Status: DC
  Administered 2022-05-09 (×3): 237 mL via ORAL

## 2022-05-08 NOTE — TOC Progression Note (Signed)
Transition of Care [facility]) - Progression Note    Patient Details  Name: [name] MRN: [id number] Date of Birth: 29-Aug-1945  Transition of Care [facility]) CM/SW [place], LCSW Phone Number: 05/08/2022, 11:59 AM  Clinical Narrative:    CSW received message that patient's spouse would now like to switch to [facility]. CSW met with spouse and he confirmed that he would like [name] as patient has also been there and loved their therapy room. CSW confirmed private bed availability with [name] and they can accept patient tomorrow if medically stable. Spouse to go there to complete paperwork this afternoon.    Expected Discharge Plan: [place] Barriers to Discharge: Continued Medical Work up  Expected Discharge Plan and Services Expected Discharge Plan: [place]   Discharge Planning Services: CM Consult Post Acute Care Choice: [place] Living arrangements for the past 2 months: Single Family Home                                       Social Determinants of Health (SDOH) Interventions    Readmission Risk Interventions    07/13/2021    9:48 AM 11/18/2019   10:19 AM  Readmission Risk Prevention Plan  Transportation Screening Complete Complete  PCP or Specialist Appt within 3-5 Days  Complete  HRI or [place]  Complete  Social Work Consult for [place] Planning/Counseling  Complete  Palliative Care Screening  Not Applicable  Medication Review Press photographer) Complete Complete  HRI or [place] Complete   SW Recovery Care/Counseling Consult Complete   [place] Not Applicable

## 2022-05-08 NOTE — Progress Notes (Signed)
  Progress Note   Patient: [name] OYD:[id number] DOB: July 12, 1946 DOA: 05/05/2022     2 DOS: the patient was seen and examined on 05/08/2022   Brief hospital course: Mrs. Weis was admitted to the hospital with the working diagnosis of acute CVA   76 yo female with the past medical history of recent CVA, atrial fibrillation and GI bleed sp watchman procedure, who presented with right sided weakness. She is sp renal transplant, has history of DVT and PE but has been off anticoagulation due to GI bleeding. Reported 2 weeks of feeling week, more right than left. On her initial physical examination her blood pressure was 150/91, HR 71 and RR 21, 02 saturation 94%, hear with S1 and S2 present and regular, respiratory with no wheezing, abdomen not distended, right lower extremity edema. Decreased strength 3/5 on the right compared to left.   Head CT negative for acute changes.   Unable to perform MRI due to pacer Plan to repeat head CT non contrast. Patient very weak and deconditioned, plan for SNF when medically stable.   Assessment and Plan: * TIA (transient ischemic attack) Patient not able to get brain MRI due to pacer. I discussed with Neurology and plan to get head CT no contrast due to low GFR.  Continue with aspirin and atorvastatin   Patient will need SNF at the time of her discharge.    CVA (cerebral vascular accident) [facility] History of prior CVA Continue work up as above. Patient off anticoagulation due to GI bleed.   Atrial fibrillation s/p AVN ablation/PPM implant, s/p LAAC with Watchman Continue rate control with carvedilol No anticoagulation due to GI bleeding in the past.   CKD (chronic kidney disease), stage V ([place]) Follow up renal function in am, avoid contrast and nephrotoxic medications. Avoid hypotension.  Sp renal transplant, continue with prednisone, mycophenolate, and tracrolimus   Urinary tract infection, continue antibiotic therapy with ceftriaxone   Will plan for a total of 3 days.   Personal history of DVT/PE s/p IVC filter No anticoagulation due to GI bleeding   Asthma, chronic, unspecified asthma severity, with acute exacerbation No clinical signs of exacerbation   Gastroesophageal reflux disease Continue with PPI   Depression Continue with fluoxetine, follow up as outpatient.         Subjective: Patient with no chest pain or dyspnea, continue to be very weak and deconditioned   Physical Exam: Vitals:   05/08/22 0744 05/08/22 0800 05/08/22 1227 05/08/22 1234  BP:  (!) 149/85 138/78   Pulse:  70 70   Resp:  (!) 23 (!) 26 20  Temp:  97.6 F (36.4 C) (!) 97.3 F (36.3 C)   TempSrc:  Oral Oral   SpO2: 94% 93% 97%    Neurology awake and alert, deconditioned ENT with mild pallor Cardiovascular with S1 and S2 present  Respiratory with no rales or wheezing Abdomen with no distention Right lower extremity edema  Data Reviewed:    Family Communication: no family at the bedside. I spoke over the phone with the patient's husband about patient's  condition, plan of care, prognosis and all questions were addressed.   Disposition: Status is: Inpatient Remains inpatient appropriate because: pending head CT non contrast   Planned Discharge Destination: Skilled nursing facility      Author: Tawni Millers, MD 05/08/2022 3:52 PM  For on call review [email or web address].

## 2022-05-08 NOTE — Progress Notes (Signed)
Physical Therapy Treatment Patient Details Name: [name] MRN: [id number] DOB: May 20, 1946 Today's Date: 05/08/2022   History of Present Illness Pt is a 76 y/o female admitted secondary to increased R weakness and difficulty walking. Awaiting MRI. PMH includes a fib, dementia, HTN, ESRD s/p renal transplant and PE.    PT Comments    Pt seen for PT tx with pt agreeable to tx. Pt requires encouragement to initiate supine>sit but is able to complete with max assist with [facility] elevated & bed rails. Pt reports need to have BM but when PT removed purewick in preparation for transfer to [facility] pt already with incontinent liquid BM. Pt transferred STS with min assist for peri hygiene but assisted to [facility] with RW & step pivot for further BM. After toileting pt tolerates standing ~1 minute with BUE support on RW & min assist to allow for total assist peri hygiene. Pt ambulates around bed to chair with RW & min<>mod assist with assistance for RW management & balance. Pt demonstrates R lateral lean that worsens as she fatigues. Pt's husband reports her R lateral lean is baseline but has worsened recently. PT attempts to provide multimodal cuing & manual facilitation for improved midline orientation throughout session but poor return demo. Continue to recommend SNF upon d/c as pt would benefit from further PT services to increase independence with mobility.   Of note, pt's husband reports pt is awaiting MRI today.    Recommendations for follow up therapy are one component of a multi-disciplinary discharge planning process, led by the attending physician.  Recommendations may be updated based on patient status, additional functional criteria and insurance authorization.  Follow Up Recommendations  Skilled nursing-short term rehab (<3 hours/day) Can patient physically be transported by private vehicle: Yes   Assistance Recommended at Discharge Frequent or constant Supervision/Assistance  Patient can return  home with the following A lot of help with walking and/or transfers;A lot of help with bathing/dressing/bathroom;Assist for transportation;Help with stairs or ramp for entrance   Equipment Recommendations  None recommended by PT    Recommendations for Other Services       Precautions / Restrictions Precautions Precautions: Fall Restrictions Weight Bearing Restrictions: No     Mobility  Bed Mobility Overal bed mobility: Needs Assistance Bed Mobility: Supine to Sit     Supine to sit: Max assist, HOB elevated     General bed mobility comments: encouragement/cuing to initiate supine>sit, cuing ot utilized bed rails, HOB elevated, extra time to upright trunk to come sitting EOB    Transfers Overall transfer level: Needs assistance Equipment used: Rolling walker (2 wheels) Transfers: Sit to/from Stand, Bed to chair/wheelchair/BSC Sit to Stand: Min assist   Step pivot transfers: Min assist       General transfer comment: Ongoing cuing re: safe hand placement during STS (cuing to push up with 1UE vs BUE on RW). Pt completes bed>BSC on L with RW & min assist.    Ambulation/Gait Ambulation/Gait assistance: Min assist, Mod assist Gait Distance (Feet): 10 Feet Assistive device: Rolling walker (2 wheels) Gait Pattern/deviations: Decreased step length - right, Decreased step length - left, Decreased stride length, Decreased weight shift to left, Decreased stance time - left Gait velocity: decreased     General Gait Details: Pt pushes RW slightly out in front of her, heavy assistance to maneuver RW in small spaces in room. Pt with trunk curvature & lateral lean to R that worsens as pt fatigues. Poor awareness of need to square up  to recliner to then reach back & transfer stand>sit.   Stairs             Wheelchair Mobility    Modified Rankin (Stroke Patients Only)       Balance Overall balance assessment: Needs assistance Sitting-balance support: Bilateral upper  extremity supported, Feet supported Sitting balance-Leahy Scale: Fair Sitting balance - Comments: supervision static sitting on BSC with back support   Standing balance support: During functional activity, Bilateral upper extremity supported Standing balance-Leahy Scale: Poor                              Cognition Arousal/Alertness: Awake/alert Behavior During Therapy: WFL for tasks assessed/performed Overall Cognitive Status: History of cognitive impairments - at baseline (hx of dementia per chart) Area of Impairment: Safety/judgement, Awareness, Following commands                       Following Commands: Follows one step commands inconsistently, Follows one step commands with increased time Safety/Judgement: Decreased awareness of safety, Decreased awareness of deficits Awareness: Anticipatory   General Comments: Pt requires extra time to follow simple commands throughout session, decreased safety awareness, decreased awareness overall.        Exercises      General Comments        Pertinent Vitals/Pain Pain Assessment Pain Assessment: No/denies pain    Home Living                          Prior Function            PT Goals (current goals can now be found in the care plan section) Acute Rehab PT Goals Patient Stated Goal: to get stronger PT Goal Formulation: With patient Time For Goal Achievement: 05/20/22 Potential to Achieve Goals: Fair Progress towards PT goals: Progressing toward goals    Frequency    Min 2X/week      PT Plan Current plan remains appropriate    Co-evaluation              AM-PAC PT "6 Clicks" Mobility   Outcome Measure  Help needed turning from your back to your side while in a flat bed without using bedrails?: A Little Help needed moving from lying on your back to sitting on the side of a flat bed without using bedrails?: A Lot Help needed moving to and from a bed to a chair (including a  wheelchair)?: A Lot Help needed standing up from a chair using your arms (e.g., wheelchair or bedside chair)?: A Lot Help needed to walk in hospital room?: Total Help needed climbing 3-5 steps with a railing? : Total 6 Click Score: 11    End of Session   Activity Tolerance: Patient tolerated treatment well (but does c/o fatigue at end of session) Patient left: in chair;with chair alarm set;with call bell/phone within reach;with family/visitor present;with nursing/sitter in room Nurse Communication: Mobility status PT Visit Diagnosis: Unsteadiness on feet (R26.81);Muscle weakness (generalized) (M62.81);Difficulty in walking, not elsewhere classified (R26.2)     Time: 3419-3790 PT Time Calculation (min) (ACUTE ONLY): 28 min  Charges:  $Therapeutic Activity: 8-22 mins $Neuromuscular Re-education: 8-22 mins                     Lavone Nian, PT, DPT 05/08/22, 11:33 AM   Waunita Schooner 05/08/2022, 11:30 AM

## 2022-05-08 NOTE — Assessment & Plan Note (Signed)
Continue with fluoxetine, follow up as outpatient.

## 2022-05-08 NOTE — Telephone Encounter (Signed)
1st no show, fee waived, letter sent 

## 2022-05-08 NOTE — Progress Notes (Addendum)
Initial Nutrition Assessment  DOCUMENTATION CODES:   Non-severe (moderate) malnutrition in context of chronic illness  INTERVENTION:  - Add Nepro Shake po TID, each supplement provides 425 kcal and 19 grams protein  - Add Renal MVI q day.   NUTRITION DIAGNOSIS:   Moderate Malnutrition related to chronic illness as evidenced by energy intake < 75% for > or equal to 3 months, moderate fat depletion, moderate muscle depletion.  GOAL:   Patient will meet greater than or equal to 90% of their needs  MONITOR:   PO intake, Supplement acceptance  REASON FOR ASSESSMENT:   Malnutrition Screening Tool    ASSESSMENT:   76 y.o. female admits related to progressive weakness. PMH includes: CVA, Afib, GI bleed, CKD stage V, HTN, heart failure, ESRD post transplant. Pt currently receiving medical management for TIA.  Meds include: calcitonin, lasix. Labs reviewed.   The pt reports that she has been eating better since admission compared to how she was eating PTA. She states that she has had a poor appetite for at least 3 months now. She states that she has lost a significant amount of weight. No significant wt loss per record. However, pt qualifies for malnutrition based on NFPE. Pt agrees to drink Nepro TID while here. RD will add supplements and continue to monitor PO intakes. Will also add Rena-vit.   NUTRITION - FOCUSED PHYSICAL EXAM:  Flowsheet Row Most Recent Value  Orbital Region Moderate depletion  Upper Arm Region Moderate depletion  Thoracic and Lumbar Region Unable to assess  Buccal Region Moderate depletion  Temple Region Moderate depletion  Clavicle Bone Region Moderate depletion  Clavicle and Acromion Bone Region Moderate depletion  Scapular Bone Region Unable to assess  Dorsal Hand Mild depletion  Patellar Region Mild depletion  Anterior Thigh Region Mild depletion  Posterior Calf Region Mild depletion  Edema (RD Assessment) Moderate  Hair Reviewed  Eyes Reviewed   Mouth Reviewed  Skin Reviewed  Nails Reviewed       Diet Order:   Diet Order             Diet Heart Room service appropriate? Yes; Fluid consistency: Thin  Diet effective now                   EDUCATION NEEDS:   Not appropriate for education at this time  Skin:  Skin Assessment: Reviewed RN Assessment  Last BM:  05/07/22  Height:   Ht Readings from Last 1 Encounters:  04/26/22 '5\' 5"'$  (1.651 m)    Weight:   Wt Readings from Last 1 Encounters:  04/26/22 50.8 kg    Ideal Body Weight:  56.8 kg  BMI:  There is no height or weight on file to calculate BMI.  Estimated Nutritional Needs:   Kcal:  1270-1525 kcals  Protein:  60-75 gm  Fluid:  >/= 1.2 L  Thalia Bloodgood, RD, LDN, CNSC

## 2022-05-08 NOTE — Telephone Encounter (Signed)
Left VM for patient. If she calls back please have her speak with a nurse/CMA and inform that her lab work is normal except her Immunoglobulin A. We will refer to hematologist.   If any questions then please take the best time and phone number to call and I will try to call her back.   Rosemarie Ax, MD [facility] 05/08/2022, 5:08 PM

## 2022-05-09 ENCOUNTER — Inpatient Hospital Stay (HOSPITAL_COMMUNITY): Payer: Medicare Other

## 2022-05-09 DIAGNOSIS — I120 Hypertensive chronic kidney disease with stage 5 chronic kidney disease or end stage renal disease: Secondary | ICD-10-CM | POA: Diagnosis not present

## 2022-05-09 DIAGNOSIS — E876 Hypokalemia: Secondary | ICD-10-CM | POA: Diagnosis not present

## 2022-05-09 DIAGNOSIS — D509 Iron deficiency anemia, unspecified: Secondary | ICD-10-CM | POA: Diagnosis not present

## 2022-05-09 DIAGNOSIS — I2510 Atherosclerotic heart disease of native coronary artery without angina pectoris: Secondary | ICD-10-CM | POA: Diagnosis not present

## 2022-05-09 DIAGNOSIS — Z8701 Personal history of pneumonia (recurrent): Secondary | ICD-10-CM | POA: Diagnosis not present

## 2022-05-09 DIAGNOSIS — K922 Gastrointestinal hemorrhage, unspecified: Secondary | ICD-10-CM | POA: Diagnosis not present

## 2022-05-09 DIAGNOSIS — Z96641 Presence of right artificial hip joint: Secondary | ICD-10-CM | POA: Diagnosis not present

## 2022-05-09 DIAGNOSIS — Z7401 Bed confinement status: Secondary | ICD-10-CM | POA: Diagnosis not present

## 2022-05-09 DIAGNOSIS — N3001 Acute cystitis with hematuria: Secondary | ICD-10-CM | POA: Diagnosis not present

## 2022-05-09 DIAGNOSIS — I480 Paroxysmal atrial fibrillation: Secondary | ICD-10-CM | POA: Diagnosis not present

## 2022-05-09 DIAGNOSIS — Z86718 Personal history of other venous thrombosis and embolism: Secondary | ICD-10-CM | POA: Diagnosis not present

## 2022-05-09 DIAGNOSIS — Z8719 Personal history of other diseases of the digestive system: Secondary | ICD-10-CM | POA: Diagnosis not present

## 2022-05-09 DIAGNOSIS — I5042 Chronic combined systolic (congestive) and diastolic (congestive) heart failure: Secondary | ICD-10-CM | POA: Diagnosis not present

## 2022-05-09 DIAGNOSIS — R2990 Unspecified symptoms and signs involving the nervous system: Secondary | ICD-10-CM

## 2022-05-09 DIAGNOSIS — G459 Transient cerebral ischemic attack, unspecified: Secondary | ICD-10-CM | POA: Diagnosis not present

## 2022-05-09 DIAGNOSIS — I82409 Acute embolism and thrombosis of unspecified deep veins of unspecified lower extremity: Secondary | ICD-10-CM | POA: Diagnosis not present

## 2022-05-09 DIAGNOSIS — F32A Depression, unspecified: Secondary | ICD-10-CM | POA: Diagnosis not present

## 2022-05-09 DIAGNOSIS — K625 Hemorrhage of anus and rectum: Secondary | ICD-10-CM | POA: Diagnosis not present

## 2022-05-09 DIAGNOSIS — Z9884 Bariatric surgery status: Secondary | ICD-10-CM | POA: Diagnosis not present

## 2022-05-09 DIAGNOSIS — R531 Weakness: Secondary | ICD-10-CM | POA: Diagnosis not present

## 2022-05-09 DIAGNOSIS — F29 Unspecified psychosis not due to a substance or known physiological condition: Secondary | ICD-10-CM | POA: Diagnosis not present

## 2022-05-09 DIAGNOSIS — Z940 Kidney transplant status: Secondary | ICD-10-CM | POA: Diagnosis not present

## 2022-05-09 DIAGNOSIS — R600 Localized edema: Secondary | ICD-10-CM | POA: Diagnosis not present

## 2022-05-09 DIAGNOSIS — I639 Cerebral infarction, unspecified: Secondary | ICD-10-CM | POA: Diagnosis not present

## 2022-05-09 DIAGNOSIS — R634 Abnormal weight loss: Secondary | ICD-10-CM | POA: Diagnosis not present

## 2022-05-09 DIAGNOSIS — R1900 Intra-abdominal and pelvic swelling, mass and lump, unspecified site: Secondary | ICD-10-CM | POA: Diagnosis not present

## 2022-05-09 DIAGNOSIS — J45909 Unspecified asthma, uncomplicated: Secondary | ICD-10-CM | POA: Diagnosis not present

## 2022-05-09 DIAGNOSIS — R627 Adult failure to thrive: Secondary | ICD-10-CM | POA: Diagnosis not present

## 2022-05-09 DIAGNOSIS — Z8711 Personal history of peptic ulcer disease: Secondary | ICD-10-CM | POA: Diagnosis not present

## 2022-05-09 DIAGNOSIS — Z950 Presence of cardiac pacemaker: Secondary | ICD-10-CM | POA: Diagnosis not present

## 2022-05-09 DIAGNOSIS — I4891 Unspecified atrial fibrillation: Secondary | ICD-10-CM | POA: Diagnosis not present

## 2022-05-09 DIAGNOSIS — E440 Moderate protein-calorie malnutrition: Secondary | ICD-10-CM

## 2022-05-09 DIAGNOSIS — N185 Chronic kidney disease, stage 5: Secondary | ICD-10-CM | POA: Diagnosis not present

## 2022-05-09 DIAGNOSIS — K5731 Diverticulosis of large intestine without perforation or abscess with bleeding: Secondary | ICD-10-CM | POA: Diagnosis not present

## 2022-05-09 DIAGNOSIS — Z8673 Personal history of transient ischemic attack (TIA), and cerebral infarction without residual deficits: Secondary | ICD-10-CM | POA: Diagnosis not present

## 2022-05-09 DIAGNOSIS — Z95818 Presence of other cardiac implants and grafts: Secondary | ICD-10-CM | POA: Diagnosis not present

## 2022-05-09 DIAGNOSIS — I959 Hypotension, unspecified: Secondary | ICD-10-CM | POA: Diagnosis not present

## 2022-05-09 DIAGNOSIS — G9389 Other specified disorders of brain: Secondary | ICD-10-CM | POA: Diagnosis not present

## 2022-05-09 DIAGNOSIS — E46 Unspecified protein-calorie malnutrition: Secondary | ICD-10-CM | POA: Diagnosis not present

## 2022-05-09 DIAGNOSIS — K209 Esophagitis, unspecified without bleeding: Secondary | ICD-10-CM | POA: Diagnosis not present

## 2022-05-09 DIAGNOSIS — I132 Hypertensive heart and chronic kidney disease with heart failure and with stage 5 chronic kidney disease, or end stage renal disease: Secondary | ICD-10-CM | POA: Diagnosis not present

## 2022-05-09 DIAGNOSIS — Z95828 Presence of other vascular implants and grafts: Secondary | ICD-10-CM | POA: Diagnosis not present

## 2022-05-09 DIAGNOSIS — E8722 Chronic metabolic acidosis: Secondary | ICD-10-CM | POA: Diagnosis not present

## 2022-05-09 DIAGNOSIS — K648 Other hemorrhoids: Secondary | ICD-10-CM | POA: Diagnosis not present

## 2022-05-09 DIAGNOSIS — J320 Chronic maxillary sinusitis: Secondary | ICD-10-CM | POA: Diagnosis not present

## 2022-05-09 DIAGNOSIS — I110 Hypertensive heart disease with heart failure: Secondary | ICD-10-CM | POA: Diagnosis not present

## 2022-05-09 DIAGNOSIS — I482 Chronic atrial fibrillation, unspecified: Secondary | ICD-10-CM | POA: Diagnosis not present

## 2022-05-09 DIAGNOSIS — K921 Melena: Secondary | ICD-10-CM | POA: Diagnosis not present

## 2022-05-09 DIAGNOSIS — N186 End stage renal disease: Secondary | ICD-10-CM | POA: Diagnosis not present

## 2022-05-09 LAB — BASIC METABOLIC PANEL
Anion gap: 13 (ref 5–15)
BUN: 59 mg/dL — ABNORMAL HIGH (ref 8–23)
CO2: 17 mmol/L — ABNORMAL LOW (ref 22–32)
Calcium: 9.1 mg/dL (ref 8.9–10.3)
Chloride: 109 mmol/L (ref 98–111)
Creatinine, Ser: 3.41 mg/dL — ABNORMAL HIGH (ref 0.44–1.00)
GFR, Estimated: 14 mL/min — ABNORMAL LOW (ref >60)
Glucose, Bld: 92 mg/dL (ref 70–99)
Potassium: 5.3 mmol/L — ABNORMAL HIGH (ref 3.5–5.1)
Sodium: 139 mmol/L (ref 135–145)

## 2022-05-09 MED ORDER — SODIUM POLYSTYRENE SULFONATE 15 GM/60ML PO SUSP
30.0000 g | Freq: Once | ORAL | Status: AC
  Administered 2022-05-09: 30 g via ORAL
  Filled 2022-05-09: qty 120

## 2022-05-09 NOTE — Care Management Important Message (Signed)
Important Message  Patient Details  Name: [name] MRN: [id number] Date of Birth: 1946/05/14   Medicare Important Message Given:  Yes     Orbie Pyo 05/09/2022, 3:07 PM

## 2022-05-09 NOTE — TOC Transition Note (Signed)
Transition of Care [facility]) - CM/SW Discharge Note   Patient Details  Name: [name] MRN: [id number] Date of Birth: 02/11/1946  Transition of [facility] CM/SW Contact:  Benard Halsted, LCSW Phone Number: 05/09/2022, 1:04 PM   Clinical Narrative:    [facility] will [facility] to: [name] Anticipated DC date: 05/09/22 Family notified: Spouse at bedside Transport by: Corey Harold   Per MD patient ready for DC to [name]. RN to call report prior to discharge [phone] room [place]). RN, patient, patient's family, and facility notified of DC. Discharge Summary and FL2 sent to facility. DC packet on chart. Ambulance transport requested for patient.   CSW will sign off for now as social work intervention is no longer needed. Please consult [name] again if new needs arise.     Final next level of care: Skilled Nursing Facility Barriers to Discharge: Barriers Resolved   Patient Goals and CMS Choice Patient states their goals for this hospitalization and ongoing recovery are:: Rehab CMS Medicare.gov Compare Post Acute Care list provided to:: Patient Represenative (must comment) Choice offered to / list presented to : Spouse  Discharge Placement   Existing PASRR number confirmed : 05/09/22          [facility] bed at: [name] Patient to be transferred to facility by: [place] Name of family member notified: Spouse Patient and family notified of of transfer: 05/09/22  Discharge Plan and Services   Discharge Planning Services: CM Consult Post Acute Care Choice: [place]                               Social Determinants of Health (SDOH) Interventions     Readmission Risk Interventions    07/13/2021    9:48 AM 11/18/2019   10:19 AM  Readmission Risk Prevention Plan  Transportation Screening Complete Complete  PCP or Specialist Appt within 3-5 Days  Complete  HRI or [place]  Complete  Social Work Consult for [place]  Planning/Counseling  Complete  Palliative Care Screening  Not Applicable  Medication Review Press photographer) Complete Complete  HRI or [place] Complete   SW Recovery Care/Counseling Consult Complete   [place] Not Applicable

## 2022-05-09 NOTE — Progress Notes (Signed)
Patient left via PTAR.  All belongings placed in belongings bag and transported with patient.

## 2022-05-09 NOTE — Discharge Summary (Addendum)
Physician Discharge Summary  [name] PJA:[id number] DOB: [birth year] DOA: 05/05/2022  PCP: [name], NP  Admit date: 05/05/2022 Discharge date: 05/09/2022  Admitted From: home  Disposition:  SNF   Recommendations for Outpatient Follow-up:  Follow up with PCP in 1-2 weeks Please obtain BMP/CBC in one week   Home Health:No  Equipment/Devices:None   Discharge Condition:Stable  CODE STATUS:Full  Diet recommendation: Heart Healthy   Brief/Interim Summary: 76 y.o. female past medical history of CVA, atrial fibrillation, GI bleed status post Watchman procedure, history of renal transplant, DVT and PE off anticoagulation due to GI bleed comes in for 2 weeks of feeling weak right more than left.  CT of the head was negative for acute findings, we were unable to perform an MRI due to pacer, plan is to repeat CT of the head, patient is very weak and deconditioned plan to discharge to skilled nursing facility.    Discharge Diagnoses:  Principal Problem:   TIA (transient ischemic attack) Active Problems:   CVA (cerebral vascular accident) ([place])   Atrial fibrillation s/p AVN ablation/PPM implant, s/p LAAC with Watchman   CKD (chronic kidney disease), stage V (HCC)   Personal history of DVT/PE s/p IVC filter   Gastroesophageal reflux disease   Asthma, chronic, unspecified asthma severity, with acute exacerbation   Depression   Malnutrition of moderate degree  Possible TIA: Stroke was ruled out 2 CT scans were done that showed no acute CVA. MRI of the brain cannot be done as the patient had a pacer. It was discussed with neurology and he recommended to follow-up with them as an outpatient. She was continue on aspirin and statins. Physical therapy evaluated the patient recommended rehab.  History of CVA: Not on anticoagulation due to significant GI bleed. She will continue aspirin.  Chronic atrial fibrillation status post ablation/PPM implant, with a history of  Watchman procedure placement: She remained rate controlled not a candidate for anticoagulation due to GI bleed.  Chronic kidney disease stage V: Continue prednisone and CellCept and tacrolimus. There was a concern for UTI she completed 3-day course of antibiotics in house.  History of PE and DVT status post IVC filter: On anticoagulation due to GI bleed.  Asthma: Remains stable noted.  GERD: Continue PPI.  Depression: Continue fluoxetine.  Stage II CKD cubitus ulcer present on admission: Noted.  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   albuterol (2.5 MG/3ML) 0.083% nebulizer solution Commonly known as: PROVENTIL Take 3 mLs (2.5 mg total) by nebulization every 6 (six) hours as  needed for wheezing or shortness of breath. Dx:J45.909   aspirin EC 81 MG tablet Take 81 mg by mouth daily. Swallow whole.   atorvastatin 20 MG tablet Commonly known as: LIPITOR TAKE 1 TABLET BY MOUTH EVERY DAY   budesonide-formoterol 160-4.5 MCG/ACT inhaler Commonly known as: SYMBICORT Inhale 2 puffs into the lungs 2 (two) times daily. What changed:  when to take this reasons to take this   calcitonin (salmon) 200 UNIT/ACT nasal  spray Commonly known as: Miacalcin Place 1 spray into alternate nostrils daily.   carvedilol 3.125 MG tablet Commonly known as: COREG Take 1 tablet (3.125 mg total) by mouth 2 (two) times daily with a meal.   docusate sodium 100 MG capsule Commonly known as: COLACE Take 100 mg by mouth daily as needed for mild constipation.   DULoxetine 30 MG capsule Commonly known as: CYMBALTA TAKE 1 CAPSULE BY MOUTH EVERY DAY What changed: how much to take   FLUoxetine 20 MG capsule Commonly known as: PROZAC 2 qam What changed:  how much to take how to take this when to take this additional instructions   furosemide 80 MG tablet Commonly known as: LASIX Take 80 mg by mouth 3 (three) times a week. M, W,F   lactulose 10 GM/15ML solution Commonly known as: CHRONULAC Take 30 mLs (20 g total) by mouth daily as needed for mild constipation or severe constipation.   mycophenolate 180 MG EC tablet Commonly known as: MYFORTIC Take 180 mg by mouth 2 (two) times daily.   ondansetron 4 MG tablet Commonly known as: ZOFRAN Take 1 tablet (4 mg total) by mouth every 8 (eight) hours as needed for up to 9 doses for nausea.   pantoprazole 20 MG tablet Commonly known as: PROTONIX TAKE 1 TABLET BY MOUTH EVERY DAY   polyethylene glycol 17 g packet Commonly known as: MIRALAX / GLYCOLAX Take 17 g by mouth daily. Hold if diarrhea   potassium chloride 10 MEQ CR capsule Commonly known as: MICRO-K Take 10 mEq by mouth 3 (three) times a week. M, W,F   predniSONE 5 MG tablet Commonly known as: DELTASONE Take 5 mg by mouth daily.   sodium bicarbonate 650 MG tablet Take 650 mg by mouth 2 (two) times daily.   tacrolimus 1 MG capsule Commonly known as: PROGRAF Take 1 mg by mouth 2 (two) times daily.        Contact information for after-discharge care     Destination     [facility] .   Service: Skilled Nursing Contact information: [address] 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 EXAM: CT HEAD WITHOUT CONTRAST TECHNIQUE: Contiguous axial images were obtained from the base of the skull through the vertex without intravenous contrast. RADIATION DOSE REDUCTION: This exam was performed according to the departmental dose-optimization program which includes automated exposure control, adjustment of the mA and/or kV according  to patient size and/or use of iterative reconstruction technique. COMPARISON:  Head CT 05/05/2022, brain MRI 07/24/2021 and earlier. FINDINGS: Brain: Chronic bilateral thalamic infarcts, larger on the left. Patchy and confluent bilateral cerebral white matter hypodensity appears stable. Chronic anterior inferior temporal lobe encephalomalacia greater on the left. Small areas of chronic encephalomalacia in the left superior parietal lobe. No midline shift, ventriculomegaly, mass effect, evidence of mass lesion, intracranial hemorrhage or evidence of cortically based acute infarction. Vascular: Calcified atherosclerosis at the skull base. No suspicious intracranial vascular hyperdensity. Skull: No acute osseous abnormality identified. Sinuses/Orbits: Partially visible chronic right maxillary sinusitis with mucoperiosteal thickening. Stable mild right frontoethmoidal recess opacification. Other visualized paranasal sinuses and mastoids are stable and well aerated. Other: No acute orbit or scalp soft tissue finding. IMPRESSION: Stable non contrast CT  appearance of the brain. Chronic ischemic disease with no acute or evolving infarct identified. Electronically Signed   By: Genevie Ann M.D.   On: 05/09/2022 11:38   CT Head Wo Contrast  Result Date: 05/05/2022 CLINICAL DATA:  Neuro deficit, acute, stroke suspected EXAM: CT HEAD WITHOUT CONTRAST TECHNIQUE: Contiguous axial images were obtained from the base of the skull through the vertex without intravenous contrast. RADIATION DOSE REDUCTION: This exam was performed according to the departmental dose-optimization program which includes automated exposure control, adjustment of the mA and/or kV according to patient size and/or use of iterative reconstruction technique. COMPARISON:  07/23/2021 and previous FINDINGS: Brain: Diffuse parenchymal atrophy. Patchy areas of hypoattenuation in deep and periventricular white matter bilaterally. Negative for acute intracranial hemorrhage, mass lesion, acute infarction, midline shift, or mass-effect. Acute infarct may be inapparent on noncontrast CT. Ventricles and sulci symmetric. Vascular: Atherosclerotic and physiologic intracranial calcifications. Skull: Normal. Negative for fracture or focal lesion. Sinuses/Orbits: No acute finding. Other: None IMPRESSION: 1. Negative for bleed or other acute intracranial process. 2. Atrophy and nonspecific white matter changes. Electronically Signed   By: Lucrezia Europe M.D.   On: 05/05/2022 11:15   US Pelvis Limited  Result Date: 04/22/2022 CLINICAL DATA:  Traumatic hematoma of groin. Last injury 4 weeks ago. Falls and injuries. EXAM: LIMITED ULTRASOUND OF PELVIS TECHNIQUE: Limited transabdominal ultrasound examination of the pelvis was performed. COMPARISON:  CT of the abdomen and pelvis January 19, 2022. Pelvic ultrasound January 02, 2022. FINDINGS: A mass is identified in the region of the patient's symptoms measuring 10 x 10 x 6.1 cm today. This mass measured 15.7 x 14.7 x 6 cm on the previous ultrasound. IMPRESSION: The mass in the region  of the patient's symptoms could represent slowly resolving hematoma given the decrease in size and history of trauma. Based on imaging lung, a neoplasm could not be excluded. Recommend continued clinical and imaging follow-up to ensure complete resolution of the finding. Electronically Signed   By: Dorise Bullion III M.D.   On: 04/22/2022 15:10   US Venous Img Lower Unilateral Right  Result Date: 04/20/2022 CLINICAL DATA:  Right lower extremity edema for the past 3 weeks. Evaluate for DVT. EXAM: RIGHT LOWER EXTREMITY VENOUS DOPPLER ULTRASOUND TECHNIQUE: Gray-scale sonography with graded compression, as well as color Doppler and duplex ultrasound were performed to evaluate the lower extremity deep venous systems from the level of the common femoral vein and including the common femoral, femoral, profunda femoral, popliteal and calf veins including the posterior tibial, peroneal and gastrocnemius veins when visible. The superficial great saphenous vein was also interrogated. Spectral Doppler was utilized to evaluate flow at rest and with distal augmentation maneuvers  in the common femoral, femoral and popliteal veins. COMPARISON:  Right lower extremity venous Doppler ultrasound-12/19/2021 (negative). FINDINGS: Contralateral Common Femoral Vein: Respiratory phasicity is normal and symmetric with the symptomatic side. No evidence of thrombus. Normal compressibility. Common Femoral Vein: No evidence of thrombus. Normal compressibility, respiratory phasicity and response to augmentation. Saphenofemoral Junction: No evidence of thrombus. Normal compressibility and flow on color Doppler imaging. Profunda Femoral Vein: No evidence of thrombus. Normal compressibility and flow on color Doppler imaging. Femoral Vein: No evidence of thrombus. Normal compressibility, respiratory phasicity and response to augmentation. Popliteal Vein: No evidence of thrombus. Normal compressibility, respiratory phasicity and response to  augmentation. Calf Veins: No evidence of thrombus. Normal compressibility and flow on color Doppler imaging. Superficial Great Saphenous Vein: No evidence of thrombus. Normal compressibility. Other Findings: There is a moderate amount of subcutaneous edema at the level of the right calf (image 36), similar to the 12/19/2021 examination. IMPRESSION: No evidence of DVT within the right lower extremity. Electronically Signed   By: Sandi Mariscal M.D.   On: 04/20/2022 14:53   (Echo, Carotid, EGD, Colonoscopy, ERCP)    Subjective: No complains  Discharge Exam: Vitals:   05/09/22 0323 05/09/22 0757  BP: (!) 148/86 (!) 144/80  Pulse: 70 72  Resp: 20 18  Temp: 97.6 F (36.4 C) 97.8 F (36.6 C)  SpO2: 95% 96%   Vitals:   05/08/22 2000 05/08/22 2333 05/09/22 0323 05/09/22 0757  BP: (!) 145/84 (!) 144/90 (!) 148/86 (!) 144/80  Pulse: 73 76 70 72  Resp: (!) 23 (!) '21 20 18  '$ Temp:  97.7 F (36.5 C) 97.6 F (36.4 C) 97.8 F (36.6 C)  TempSrc:  Oral Oral Oral  SpO2:  93% 95% 96%  Weight:      Height:        General: Pt is alert, awake, not in acute distress Cardiovascular: RRR, S1/S2 +, no rubs, no gallops Respiratory: CTA bilaterally, no wheezing, no rhonchi Abdominal: Soft, NT, ND, bowel sounds + Extremities: no edema, no cyanosis    The results of significant diagnostics from this hospitalization (including imaging, microbiology, ancillary and laboratory) are listed below for reference.     Microbiology: No results found for this or any previous visit (from the past 240 hour(s)).   Labs: BNP (last 3 results) Recent Labs    06/14/21 1319  BNP 2,440.1*   Basic Metabolic Panel: Recent Labs  Lab 05/05/22 1043 05/07/22 0408 05/09/22 0855  NA 137 138 139  K 4.5 5.1 5.3*  CL 107 107 109  CO2 25 19* 17*  GLUCOSE 111* 81 92  BUN 48* 55* 59*  CREATININE 3.41* 3.33* 3.41*  CALCIUM 9.0 9.4 9.1   Liver Function Tests: Recent Labs  Lab 05/05/22 1043  AST 15  ALT 12   ALKPHOS 70  BILITOT 0.7  PROT 5.9*  ALBUMIN 2.9*   No results for input(s): "LIPASE", "AMYLASE" in the last 168 hours. No results for input(s): "AMMONIA" in the last 168 hours. CBC: Recent Labs  Lab 05/05/22 1043 05/06/22 1130  WBC 4.8 5.8  NEUTROABS 3.5  --   HGB 12.4 13.4  HCT 41.6 43.1  MCV 91.4 89.0  PLT 126* 139*   Cardiac Enzymes: No results for input(s): "CKTOTAL", "CKMB", "CKMBINDEX", "TROPONINI" in the last 168 hours. BNP: Invalid input(s): "POCBNP" CBG: Recent Labs  Lab 05/05/22 1511  GLUCAP 103*   D-Dimer No results for input(s): "DDIMER" in the last 72 hours. Hgb A1c No results for input(s): "  HGBA1C" in the last 72 hours. Lipid Profile No results for input(s): "CHOL", "HDL", "LDLCALC", "TRIG", "CHOLHDL", "LDLDIRECT" in the last 72 hours. Thyroid function studies No results for input(s): "TSH", "T4TOTAL", "T3FREE", "THYROIDAB" in the last 72 hours.  Invalid input(s): "FREET3" Anemia work up No results for input(s): "VITAMINB12", "FOLATE", "FERRITIN", "TIBC", "IRON", "RETICCTPCT" in the last 72 hours. Urinalysis    Component Value Date/Time   COLORURINE AMBER (A) 05/05/2022 1043   APPEARANCEUR CLOUDY (A) 05/05/2022 1043   LABSPEC 1.016 05/05/2022 1043   PHURINE 7.0 05/05/2022 1043   GLUCOSEU 50 (A) 05/05/2022 1043   HGBUR NEGATIVE 05/05/2022 1043   HGBUR negative 10/04/2009 1044   BILIRUBINUR NEGATIVE 05/05/2022 1043   BILIRUBINUR negative 12/13/2017 1508   KETONESUR NEGATIVE 05/05/2022 1043   PROTEINUR >=300 (A) 05/05/2022 1043   UROBILINOGEN 0.2 12/13/2017 1508   UROBILINOGEN 0.2 03/25/2012 1354   NITRITE NEGATIVE 05/05/2022 1043   LEUKOCYTESUR LARGE (A) 05/05/2022 1043   Sepsis Labs Recent Labs  Lab 05/05/22 1043 05/06/22 1130  WBC 4.8 5.8   Microbiology No results found for this or any previous visit (from the past 240 hour(s)).   SIGNED:   Charlynne Cousins, MD  [facility] 05/09/2022, 12:00 PM Pager   If  7PM-7AM, please contact night-coverage [email or web address] Password [email or web address]

## 2022-05-09 NOTE — Progress Notes (Addendum)
[facility] PROGRESS NOTE    Progress Note  CECILY LAWHORNE  VQM:[id number] DOB: [birth year] DOA: 05/05/2022 PCP: [name], NP     Brief Narrative:   [name] is an 76 y.o. female past medical history of CVA, atrial fibrillation, GI bleed status post Watchman procedure, history of renal transplant, DVT and PE off anticoagulation due to GI bleed comes in for 2 weeks of feeling weak right more than left.  CT of the head was negative for acute findings, we were unable to perform an MRI due to pacer, plan is to repeat CT of the head, patient is very weak and deconditioned plan to discharge to skilled nursing facility.    Assessment/Plan:   Right sided weakness: Stroke was rule out. Not able to get an MRI due to pacer. Neurology recommended to repeat a noncontrast CT. Continue aspirin and statins. Physical therapy evaluated the patient will need skilled nursing facility at discharge.  History of CVA (cerebral vascular accident) ([place]) Not on anticoagulation due to significant GI bleed.  Atrial fibrillation s/p AVN ablation/PPM implant, s/p LAAC with Watchman Rate controlled on Coreg not on anticoagulation due to GI bleed.  CKD (chronic kidney disease), stage V (HCC) Continue prednisone CellCept anticoagulants. Concern of urinary tract infection she completed 3-day course of antibiotics in house.  History of PE/DVT status post IVC filter: Not on anticoagulation due to GI bleed.  Asthma: No signs of exacerbation.  GERD: Continue PPI.  Depression: Continue fluoxetine.  Sacral decubitus stage II present on admission RN Pressure Injury Documentation: Pressure Injury 07/08/21 Sacrum Stage 2 -  Partial thickness loss of dermis presenting as a shallow open injury with a red, pink wound bed without slough. (Active)  07/08/21 0358  Location: Sacrum  Location Orientation:   Staging: Stage 2 -  Partial thickness loss of dermis presenting as a shallow open injury  with a red, pink wound bed without slough.  Wound Description (Comments):   Present on Admission: Yes   Severe protein caloric malnutrition: Noted Ensure 3 times daily   DVT prophylaxis: Lovenox Family Communication:husband Status is: Inpatient Remains inpatient appropriate because: Acute metabolic encephalopathy    Code Status:     Code Status Orders  (From admission, onward)           Start     Ordered   05/06/22 0400  Full code  Continuous        05/06/22 0359           Code Status History     Date Active Date Inactive Code Status Order ID Comments User Context   01/19/2022 0600 01/20/2022 2126 Full Code [id number]  Etta Quill, DO ED   07/23/2021 1413 07/26/2021 1632 Full Code [id number]  Orma Flaming, MD ED   07/08/2021 0121 07/13/2021 1657 Full Code [id number]  Doran Heater, DO ED   06/14/2021 2247 06/16/2021 2006 Full Code [id number]  Orene Desanctis, DO Inpatient   03/19/2021 0014 03/25/2021 1703 Full Code [id number]  Lenore Cordia, MD Inpatient   03/11/2020 1530 03/13/2020 2155 Full Code [id number]  Donne Hazel, MD ED   11/14/2019 2123 11/18/2019 1607 Full Code [id number]  Arlan Organ, DO Inpatient   02/03/2018 1806 02/05/2018 1548 Full Code [id number]  Rondel Jumbo, PA-C ED   11/03/2017 2351 11/04/2017 0615 Full Code [id number]  Reyne Dumas, MD Inpatient   11/03/2017 2334 11/03/2017 2351 Full Code [id number]  Jani Gravel, MD Inpatient   03/25/2012 2351  03/31/2012 1739 Full Code [id number]  Audie Clear, RN Inpatient   12/21/2011 1805 12/25/2011 1439 Full Code [id number]  [place], [place], RN Inpatient   12/08/2011 0647 12/09/2011 1947 Full Code [id number]  Clydene Pugh, RN Inpatient   09/21/2011 1743 10/02/2011 1646 Full Code [id number]  Nita Sells, MD Inpatient   09/03/2011 1602 09/06/2011 1820 Full Code [id number]  Hubert Azure, RN Inpatient   06/06/2011 2100 06/11/2011 2006 Full Code [id number]  Lemar Livings, RN Inpatient         IV Access:    Peripheral IV   Procedures and diagnostic studies:   No results found.   Medical Consultants:   None.   Subjective:    [name] no complaints.  Objective:    Vitals:   05/08/22 1957 05/08/22 2000 05/08/22 2333 05/09/22 0323  BP: (!) 140/82 (!) 145/84 (!) 144/90 (!) 148/86  Pulse: 73 73 76 70  Resp: 20 (!) 23 (!) 21 20  Temp: 97.6 F (36.4 C)  97.7 F (36.5 C) 97.6 F (36.4 C)  TempSrc: Oral  Oral Oral  SpO2: 94%  93% 95%  Weight:      Height:       SpO2: 95 %   Intake/Output Summary (Last 24 hours) at 05/09/2022 0755 Last data filed at 05/08/2022 1700 Gross per 24 hour  Intake 722.45 ml  Output 900 ml  Net -177.55 ml   Filed Weights   05/08/22 1650  Weight: 52.4 kg    Exam: General exam: In no acute distress. Respiratory system: Good air movement and clear to auscultation. Cardiovascular system: S1 & S2 heard, RRR. No JVD. Gastrointestinal system: Abdomen is nondistended, soft and nontender.  Extremities: No pedal edema. Skin: No rashes, lesions or ulcers Psychiatry: Judgement and insight appear normal. Mood & affect appropriate.    Data Reviewed:    Labs: Basic Metabolic Panel: Recent Labs  Lab 05/05/22 1043 05/07/22 0408  NA 137 138  K 4.5 5.1  CL 107 107  CO2 25 19*  GLUCOSE 111* 81  BUN 48* 55*  CREATININE 3.41* 3.33*  CALCIUM 9.0 9.4   GFR Estimated Creatinine Clearance: 12.4 mL/min (A) (by C-G formula based on SCr of 3.33 mg/dL (H)). Liver Function Tests: Recent Labs  Lab 05/05/22 1043  AST 15  ALT 12  ALKPHOS 70  BILITOT 0.7  PROT 5.9*  ALBUMIN 2.9*   No results for input(s): "LIPASE", "AMYLASE" in the last 168 hours. No results for input(s): "AMMONIA" in the last 168 hours. Coagulation profile Recent Labs  Lab 05/05/22 1043  INR 1.3*   COVID-19 Labs  No results for input(s): "DDIMER", "FERRITIN", "LDH", "CRP" in the last 72 hours.  Lab Results  Component Value Date   SARSCOV2NAA NEGATIVE  07/25/2021   SARSCOV2NAA NEGATIVE 07/23/2021   [place] NEGATIVE 07/07/2021   [place] NEGATIVE 06/14/2021    CBC: Recent Labs  Lab 05/02/22 0933 05/05/22 1043 05/06/22 1130  WBC  --  4.8 5.8  NEUTROABS  --  3.5  --   HGB 13.1 12.4 13.4  HCT  --  41.6 43.1  MCV  --  91.4 89.0  PLT  --  126* 139*   Cardiac Enzymes: No results for input(s): "CKTOTAL", "CKMB", "CKMBINDEX", "TROPONINI" in the last 168 hours. BNP (last 3 results) No results for input(s): "PROBNP" in the last 8760 hours. CBG: Recent Labs  Lab 05/05/22 1511  GLUCAP 103*   D-Dimer: No results for input(s): "DDIMER" in the last  72 hours. Hgb A1c: No results for input(s): "HGBA1C" in the last 72 hours. Lipid Profile: No results for input(s): "CHOL", "HDL", "LDLCALC", "TRIG", "CHOLHDL", "LDLDIRECT" in the last 72 hours. Thyroid function studies: Recent Labs    05/06/22 1130  TSH 2.192   Anemia work up: Recent Labs    05/06/22 1052 05/06/22 1130  VITAMINB12  --  1,450*  FOLATE 10.4  --    Sepsis Labs: Recent Labs  Lab 05/05/22 1043 05/06/22 1130  WBC 4.8 5.8   Microbiology No results found for this or any previous visit (from the past 240 hour(s)).   Medications:    aspirin EC  81 mg Oral Daily   atorvastatin  20 mg Oral Daily   calcitonin (salmon)  1 spray Alternating Nares Daily   carvedilol  3.125 mg Oral BID WC   feeding supplement (NEPRO CARB STEADY)  237 mL Oral TID WC   FLUoxetine  40 mg Oral Daily   furosemide  80 mg Oral Once per day on Mon Wed Fri   multivitamin  1 tablet Oral QHS   mycophenolate  180 mg Oral BID   pantoprazole  20 mg Oral Daily   predniSONE  5 mg Oral Daily   tacrolimus  1 mg Oral BID   Continuous Infusions:    LOS: 3 days   Charlynne Cousins  [facility]  05/09/2022, 7:55 AM

## 2022-05-09 NOTE — Progress Notes (Signed)
Patient discharging to [facility] room 610. RN called report to [place]. SW arranged transportation to be at 1500. Husband aware. IV removed and belongings were gathered.

## 2022-05-10 ENCOUNTER — Telehealth: Payer: Self-pay

## 2022-05-10 NOTE — Telephone Encounter (Signed)
1220 pm.  Message received from spouse Naasia Weilbacher.  He has requested that Dr. Hollace Kinnier be notified of patient's recent admission to the hospital, discharge and now admission at [facility].  Advised that I would provide this update to Dr. Mariea Clonts. No further needs.

## 2022-05-11 ENCOUNTER — Telehealth: Payer: Self-pay

## 2022-05-11 DIAGNOSIS — G459 Transient cerebral ischemic attack, unspecified: Secondary | ICD-10-CM

## 2022-05-11 NOTE — Telephone Encounter (Signed)
Transition Care Management Unsuccessful Follow-up Telephone Call  Date of discharge and from where:  05/09/22 [facility] Inpatient. Dx: TIA  Attempts:  1st Attempt  Reason for unsuccessful TCM follow-up call:  Left voice message

## 2022-05-12 ENCOUNTER — Encounter: Payer: Self-pay | Admitting: Internal Medicine

## 2022-05-12 DIAGNOSIS — R1900 Intra-abdominal and pelvic swelling, mass and lump, unspecified site: Secondary | ICD-10-CM | POA: Diagnosis not present

## 2022-05-12 DIAGNOSIS — I639 Cerebral infarction, unspecified: Secondary | ICD-10-CM | POA: Diagnosis not present

## 2022-05-12 DIAGNOSIS — I82409 Acute embolism and thrombosis of unspecified deep veins of unspecified lower extremity: Secondary | ICD-10-CM | POA: Diagnosis not present

## 2022-05-12 DIAGNOSIS — I4891 Unspecified atrial fibrillation: Secondary | ICD-10-CM | POA: Diagnosis not present

## 2022-05-12 DIAGNOSIS — K922 Gastrointestinal hemorrhage, unspecified: Secondary | ICD-10-CM | POA: Diagnosis not present

## 2022-05-15 DIAGNOSIS — I639 Cerebral infarction, unspecified: Secondary | ICD-10-CM | POA: Diagnosis not present

## 2022-05-15 DIAGNOSIS — R600 Localized edema: Secondary | ICD-10-CM | POA: Diagnosis not present

## 2022-05-15 DIAGNOSIS — K922 Gastrointestinal hemorrhage, unspecified: Secondary | ICD-10-CM | POA: Diagnosis not present

## 2022-05-15 DIAGNOSIS — I4891 Unspecified atrial fibrillation: Secondary | ICD-10-CM | POA: Diagnosis not present

## 2022-05-16 ENCOUNTER — Encounter (HOSPITAL_COMMUNITY)
Admission: RE | Admit: 2022-05-16 | Discharge: 2022-05-16 | Disposition: A | Discharge status: Home / Self Care | Payer: No Typology Code available for payment source | Source: Ambulatory Visit | Attending: Nephrology | Admitting: Nephrology

## 2022-05-16 VITALS — BP 133/77 | HR 70 | Temp 97.4°F | Resp 18

## 2022-05-16 DIAGNOSIS — F32A Depression, unspecified: Secondary | ICD-10-CM | POA: Diagnosis not present

## 2022-05-16 DIAGNOSIS — N185 Chronic kidney disease, stage 5: Secondary | ICD-10-CM | POA: Insufficient documentation

## 2022-05-16 LAB — POCT HEMOGLOBIN-HEMACUE: Hemoglobin: 12.1 g/dL (ref 12.0–15.0)

## 2022-05-16 MED ORDER — EPOETIN ALFA-EPBX 10000 UNIT/ML IJ SOLN: 10000.0000 [IU] | INTRAMUSCULAR | Status: DC

## 2022-05-17 ENCOUNTER — Telehealth: Payer: Self-pay | Admitting: Nurse Practitioner

## 2022-05-17 NOTE — Telephone Encounter (Signed)
Called & left Pt & Pt's husband a detailed message advising them what to do about pt's legs

## 2022-05-17 NOTE — Telephone Encounter (Signed)
Transition Care Management Follow-up Telephone Call Date of discharge and from where: 05/09/22 [facility] Inpatient. Dx: TIA. How have you been since you were released from the hospital? As far as speech, there is an improvement. Any questions or concerns? Yes New onset of Bilateral lower extremity edema  Items Reviewed: Did the pt receive and understand the discharge instructions provided? Yes  Medications obtained and verified? Yes  Other? No  Any new allergies since your discharge? No  Dietary orders reviewed? No Do you have support at home? Yes   Home Care and Equipment/Supplies: Were home health services ordered? not applicable If so, what is the name of the agency? N/a  Has the agency set up a time to come to the patient's home? not applicable Were any new equipment or medical supplies ordered?  No What is the name of the medical supply agency? N/a Were you able to get the supplies/equipment? not applicable Do you have any questions related to the use of the equipment or supplies? No  Functional Questionnaire: (I = Independent and D = Dependent) ADLs: D  Bathing/Dressing- D  Meal Prep- D  Eating- D  Maintaining continence- D  Transferring/Ambulation- D  Managing Meds- D  Follow up appointments reviewed:  PCP Hospital f/u appt confirmed?  N/A  Scheduled to see N/A on N/A @ N/A. Pt is currently admitted to [facility], per Husband [facility] f/u appt confirmed?  no  Scheduled to see  no  on N/A @ N/A. Are transportation arrangements needed? No  If their condition worsens, is the pt aware to call PCP or go to the Emergency Dept.? Yes Was the patient provided with contact information for the PCP's office or ED? Yes Was to pt encouraged to call back with questions or concerns? Yes

## 2022-05-17 NOTE — Telephone Encounter (Signed)
Pt's husband wants advise on what to do about his wife's legs. They both are swollen. She is at the rehab center right now, but he wants to know what he should do from [place]. Please advise [name] @ [phone]

## 2022-05-18 ENCOUNTER — Other Ambulatory Visit: Payer: Self-pay | Admitting: Family Medicine

## 2022-05-18 ENCOUNTER — Telehealth: Payer: Self-pay | Admitting: *Deleted

## 2022-05-18 DIAGNOSIS — S32020A Wedge compression fracture of second lumbar vertebra, initial encounter for closed fracture: Secondary | ICD-10-CM

## 2022-05-18 DIAGNOSIS — S32010A Wedge compression fracture of first lumbar vertebra, initial encounter for closed fracture: Secondary | ICD-10-CM

## 2022-05-18 NOTE — Progress Notes (Unsigned)
  Care Coordination  Outreach Note  05/18/2022 Name: NIKITIA ASBILL MRN: [id number] DOB: [birth year]   Care Coordination Outreach Attempts: An unsuccessful telephone outreach was attempted today to offer the patient information about available care coordination services as a benefit of their health plan.   Follow Up Plan:  Additional outreach attempts will be made to offer the patient care coordination information and services.   Encounter Outcome:  No Answer  [place]  Direct Dial: [phone]

## 2022-05-18 NOTE — Telephone Encounter (Signed)
Calling [name] to provide recommendation made by PCP. [name] states that he will contact [name] and request one of the providers assess [name]' legs. Instructed to call the office if they need anything.

## 2022-05-21 NOTE — Progress Notes (Unsigned)
  Care Coordination  Outreach Note  05/21/2022 Name: [name] MRN: [id number] DOB: [birth year]   Care Coordination Outreach Attempts: A second unsuccessful outreach was attempted today to offer the patient with information about available care coordination services as a benefit of their health plan.     Follow Up Plan:  Additional outreach attempts will be made to offer the patient care coordination information and services.   Encounter Outcome:  No Answer  [place]  Direct Dial: [phone]

## 2022-05-22 ENCOUNTER — Ambulatory Visit: Payer: Medicare Other | Admitting: Nurse Practitioner

## 2022-05-22 ENCOUNTER — Telehealth: Payer: Self-pay

## 2022-05-22 NOTE — Progress Notes (Signed)
  Care Coordination  Outreach Note  05/22/2022 Name: [name] MRN: [id number] DOB: Jul 29, 1945   Care Coordination Outreach Attempts: A third unsuccessful outreach was attempted today to offer the patient with information about available care coordination services as a benefit of their health plan.   Follow Up Plan:  No further outreach attempts will be made at this time. We have been unable to contact the patient to offer or enroll patient in care coordination services  Encounter Outcome:  No Answer  [place]: [phone]

## 2022-05-22 NOTE — Progress Notes (Signed)
Chronic Care Management Pharmacy Assistant   Name: [name]  MRN: [id number] DOB: 07-20-1945  Reason for Encounter:Hypertension Disease State Call.   Recent office visits:  04/19/2022 Wilfred Lacy NP (PCP) No Medication Changes  noted, Ambulatory referral to Endocrinology  04/18/2022 Dr. Gena Fray MD (PCP) No Medication Changes noted 03/29/2022 Wilfred Lacy NP (PCP)No Medication Changes noted,Return in about 4 weeks   Recent consult visits:  05/01/2022 Hollace Kinnier DO [facility]) No Medication Changes noted 04/26/2022 Dr. Raeford Razor MD (Sports Medicine) Start calcitonin, salmon, (MIACALCIN) 200 UNIT/ACT nasal spray 04/18/2022 Dr. Casimiro Needle MD ([place]) Unable to see note 04/05/2022 Bambi Cottle LCSW (Behavioral Health) Unable to see note  Hospital visits:  Medication Reconciliation was completed by comparing discharge summary, patient's EMR and Pharmacy list, and upon discussion with patient.  Admitted to the hospital on 05/05/2022 due to Episodic memory loss. Discharge date was 05/09/2022. Discharged from [place]?Medications Started at [facility] Discharge:?? -started None ID  Medication Changes at Hospital Discharge: -Changed None ID  Medications Discontinued at Hospital Discharge: -Stopped oxycodone    Medications that remain the same after Hospital Discharge:??  -All other medications will remain the same.    Medications: Outpatient Encounter Medications as of 05/22/2022  Medication Sig Note   acetaminophen (TYLENOL) 500 MG tablet Take 500 mg by mouth every 6 (six) hours as needed for mild pain or headache.     albuterol (PROVENTIL) (2.5 MG/3ML) 0.083% nebulizer solution Take 3 mLs (2.5 mg total) by nebulization every 6 (six) hours as needed for wheezing or shortness of breath. Dx:J45.909    aspirin EC 81 MG tablet Take 81 mg by mouth daily. Swallow whole.    atorvastatin (LIPITOR) 20 MG tablet TAKE 1 TABLET BY MOUTH EVERY DAY     budesonide-formoterol (SYMBICORT) 160-4.5 MCG/ACT inhaler Inhale 2 puffs into the lungs 2 (two) times daily. (Patient taking differently: Inhale 2 puffs into the lungs 2 (two) times daily as needed (shortness of breath).) 12/13/2021: Patient states she takes only as needed.    calcitonin, salmon, (MIACALCIN/FORTICAL) 200 UNIT/ACT nasal spray PLACE 1 SPRAY INTO ALTERNATE NOSTRILS DAILY.    carvedilol (COREG) 3.125 MG tablet Take 1 tablet (3.125 mg total) by mouth 2 (two) times daily with a meal. 05/07/2022: UNK time   docusate sodium (COLACE) 100 MG capsule Take 100 mg by mouth daily as needed for mild constipation.    DULoxetine (CYMBALTA) 30 MG capsule TAKE 1 CAPSULE BY MOUTH EVERY DAY (Patient taking differently: Take 30 mg by mouth daily.) 05/07/2022: Newly prescribed (not taking yet)   FLUoxetine (PROZAC) 20 MG capsule 2 qam (Patient taking differently: Take 20 mg by mouth daily.)    furosemide (LASIX) 80 MG tablet Take 80 mg by mouth 3 (three) times a week. M, W,F    lactulose (CHRONULAC) 10 GM/15ML solution Take 30 mLs (20 g total) by mouth daily as needed for mild constipation or severe constipation.    mycophenolate (MYFORTIC) 180 MG EC tablet Take 180 mg by mouth 2 (two) times daily.    ondansetron (ZOFRAN) 4 MG tablet Take 1 tablet (4 mg total) by mouth every 8 (eight) hours as needed for up to 9 doses for nausea.    pantoprazole (PROTONIX) 20 MG tablet TAKE 1 TABLET BY MOUTH EVERY DAY    polyethylene glycol (MIRALAX / GLYCOLAX) 17 g packet Take 17 g by mouth daily. Hold if diarrhea    potassium chloride (MICRO-K) 10 MEQ CR capsule Take  10 mEq by mouth 3 (three) times a week. M, W,F    predniSONE (DELTASONE) 5 MG tablet Take 5 mg by mouth daily.    sodium bicarbonate 650 MG tablet Take 650 mg by mouth 2 (two) times daily.    tacrolimus (PROGRAF) 1 MG capsule Take 1 mg by mouth 2 (two) times daily.    No facility-administered encounter medications on file as of 05/22/2022.    Care  Gaps: COVID-19 Vaccine (4- Booster) Mammogram   Star Rating Drugs: Atorvastatin 20 mg last filled 05/10/2022 90 day supply at [place]  Reviewed chart prior to disease state call. Spoke with patient regarding BP  Recent Office Vitals: BP Readings from Last 3 Encounters:  05/16/22 133/77  05/09/22 (!) 156/85  05/02/22 (!) 161/83   Pulse Readings from Last 3 Encounters:  05/16/22 70  05/09/22 75  05/02/22 70    Wt Readings from Last 3 Encounters:  05/08/22 115 lb 8.3 oz (52.4 kg)  04/26/22 112 lb (50.8 kg)  04/19/22 112 lb (50.8 kg)     Kidney Function Lab Results  Component Value Date/Time   CREATININE 3.41 (H) 05/09/2022 08:55 AM   CREATININE 3.33 (H) 05/07/2022 04:08 AM   GFR 15.12 (L) 03/01/2022 12:41 PM   GFRNONAA 14 (L) 05/09/2022 08:55 AM   GFRAA 24 (L) 03/13/2020 11:18 AM       Latest Ref Rng & Units 05/09/2022    8:55 AM 05/07/2022    4:08 AM 05/05/2022   10:43 AM  BMP  Glucose 70 - 99 mg/dL 92  81  111   BUN 8 - 23 mg/dL 59  55  48   Creatinine 0.44 - 1.00 mg/dL 3.41  3.33  3.41   Sodium 135 - 145 mmol/L 139  138  137   Potassium 3.5 - 5.1 mmol/L 5.3  5.1  4.5   Chloride 98 - 111 mmol/L 109  107  107   CO2 22 - 32 mmol/L '17  19  25   '$ Calcium 8.9 - 10.3 mg/dL 9.1  9.4  9.0     Current antihypertensive regimen:  Carvedilol 3.125 mg twice daily  Furosemide 80 mg three times weekly    What recent interventions/DTPs have been made by any provider to improve Blood Pressure control since last CPP Visit: None ID  I have attempted without success to contact this patient by phone three times to do her Hypertension Disease State call. I left a Voice message for patient to return my call.   Adherence Review: Is the patient currently on ACE/ARB medication? No Does the patient have >5 day gap between last estimated fill dates? No   Anderson Malta Clinical Production designer, theatre/television/film [phone]

## 2022-05-30 ENCOUNTER — Encounter (HOSPITAL_COMMUNITY)
Admission: RE | Admit: 2022-05-30 | Discharge: 2022-05-30 | Disposition: A | Discharge status: Home / Self Care | Payer: No Typology Code available for payment source | Source: Ambulatory Visit | Attending: Nephrology | Admitting: Nephrology

## 2022-05-30 VITALS — BP 167/81 | HR 70 | Temp 97.0°F | Resp 18

## 2022-05-30 DIAGNOSIS — N185 Chronic kidney disease, stage 5: Secondary | ICD-10-CM

## 2022-05-30 LAB — POCT HEMOGLOBIN-HEMACUE: Hemoglobin: 12.1 g/dL (ref 12.0–15.0)

## 2022-05-30 LAB — FERRITIN: Ferritin: 527 ng/mL — ABNORMAL HIGH (ref 11–307)

## 2022-05-30 LAB — IRON AND TIBC
Iron: 65 ug/dL (ref 28–170)
Saturation Ratios: 37 % — ABNORMAL HIGH (ref 10.4–31.8)
TIBC: 176 ug/dL — ABNORMAL LOW (ref 250–450)
UIBC: 111 ug/dL

## 2022-05-30 MED ORDER — EPOETIN ALFA-EPBX 10000 UNIT/ML IJ SOLN: 10000.0000 [IU] | INTRAMUSCULAR | Status: DC

## 2022-05-31 ENCOUNTER — Telehealth: Payer: Self-pay | Admitting: Nurse Practitioner

## 2022-05-31 DIAGNOSIS — N185 Chronic kidney disease, stage 5: Secondary | ICD-10-CM | POA: Diagnosis not present

## 2022-05-31 DIAGNOSIS — Z96641 Presence of right artificial hip joint: Secondary | ICD-10-CM | POA: Diagnosis not present

## 2022-05-31 DIAGNOSIS — Z95818 Presence of other cardiac implants and grafts: Secondary | ICD-10-CM | POA: Diagnosis not present

## 2022-05-31 DIAGNOSIS — K921 Melena: Secondary | ICD-10-CM | POA: Diagnosis not present

## 2022-05-31 DIAGNOSIS — K648 Other hemorrhoids: Secondary | ICD-10-CM | POA: Diagnosis not present

## 2022-05-31 DIAGNOSIS — Z681 Body mass index (BMI) 19 or less, adult: Secondary | ICD-10-CM | POA: Diagnosis not present

## 2022-05-31 DIAGNOSIS — I120 Hypertensive chronic kidney disease with stage 5 chronic kidney disease or end stage renal disease: Secondary | ICD-10-CM | POA: Diagnosis not present

## 2022-05-31 DIAGNOSIS — Z86718 Personal history of other venous thrombosis and embolism: Secondary | ICD-10-CM | POA: Diagnosis not present

## 2022-05-31 DIAGNOSIS — K625 Hemorrhage of anus and rectum: Secondary | ICD-10-CM | POA: Diagnosis not present

## 2022-05-31 DIAGNOSIS — Z8701 Personal history of pneumonia (recurrent): Secondary | ICD-10-CM | POA: Diagnosis not present

## 2022-05-31 DIAGNOSIS — I110 Hypertensive heart disease with heart failure: Secondary | ICD-10-CM | POA: Diagnosis not present

## 2022-05-31 DIAGNOSIS — K209 Esophagitis, unspecified without bleeding: Secondary | ICD-10-CM | POA: Diagnosis not present

## 2022-05-31 DIAGNOSIS — Z8719 Personal history of other diseases of the digestive system: Secondary | ICD-10-CM | POA: Diagnosis not present

## 2022-05-31 DIAGNOSIS — E872 Acidosis, unspecified: Secondary | ICD-10-CM | POA: Diagnosis not present

## 2022-05-31 DIAGNOSIS — D62 Acute posthemorrhagic anemia: Secondary | ICD-10-CM | POA: Diagnosis not present

## 2022-05-31 DIAGNOSIS — Z452 Encounter for adjustment and management of vascular access device: Secondary | ICD-10-CM | POA: Diagnosis not present

## 2022-05-31 DIAGNOSIS — Z95828 Presence of other vascular implants and grafts: Secondary | ICD-10-CM | POA: Diagnosis not present

## 2022-05-31 DIAGNOSIS — N186 End stage renal disease: Secondary | ICD-10-CM | POA: Diagnosis not present

## 2022-05-31 DIAGNOSIS — I2510 Atherosclerotic heart disease of native coronary artery without angina pectoris: Secondary | ICD-10-CM | POA: Diagnosis not present

## 2022-05-31 DIAGNOSIS — R634 Abnormal weight loss: Secondary | ICD-10-CM | POA: Diagnosis not present

## 2022-05-31 DIAGNOSIS — Z5309 Procedure and treatment not carried out because of other contraindication: Secondary | ICD-10-CM | POA: Diagnosis not present

## 2022-05-31 DIAGNOSIS — R58 Hemorrhage, not elsewhere classified: Secondary | ICD-10-CM | POA: Diagnosis not present

## 2022-05-31 DIAGNOSIS — R627 Adult failure to thrive: Secondary | ICD-10-CM | POA: Diagnosis not present

## 2022-05-31 DIAGNOSIS — K5730 Diverticulosis of large intestine without perforation or abscess without bleeding: Secondary | ICD-10-CM | POA: Diagnosis not present

## 2022-05-31 DIAGNOSIS — I4891 Unspecified atrial fibrillation: Secondary | ICD-10-CM | POA: Diagnosis not present

## 2022-05-31 DIAGNOSIS — Z9884 Bariatric surgery status: Secondary | ICD-10-CM | POA: Diagnosis not present

## 2022-05-31 DIAGNOSIS — Z950 Presence of cardiac pacemaker: Secondary | ICD-10-CM | POA: Diagnosis not present

## 2022-05-31 DIAGNOSIS — D649 Anemia, unspecified: Secondary | ICD-10-CM | POA: Diagnosis not present

## 2022-05-31 DIAGNOSIS — I5042 Chronic combined systolic (congestive) and diastolic (congestive) heart failure: Secondary | ICD-10-CM | POA: Diagnosis not present

## 2022-05-31 DIAGNOSIS — Z940 Kidney transplant status: Secondary | ICD-10-CM | POA: Diagnosis not present

## 2022-05-31 DIAGNOSIS — E8722 Chronic metabolic acidosis: Secondary | ICD-10-CM | POA: Diagnosis present

## 2022-05-31 DIAGNOSIS — K5731 Diverticulosis of large intestine without perforation or abscess with bleeding: Secondary | ICD-10-CM | POA: Diagnosis not present

## 2022-05-31 DIAGNOSIS — M25451 Effusion, right hip: Secondary | ICD-10-CM | POA: Diagnosis not present

## 2022-05-31 DIAGNOSIS — D509 Iron deficiency anemia, unspecified: Secondary | ICD-10-CM | POA: Diagnosis not present

## 2022-05-31 DIAGNOSIS — I132 Hypertensive heart and chronic kidney disease with heart failure and with stage 5 chronic kidney disease, or end stage renal disease: Secondary | ICD-10-CM | POA: Diagnosis not present

## 2022-05-31 DIAGNOSIS — M5136 Other intervertebral disc degeneration, lumbar region: Secondary | ICD-10-CM | POA: Diagnosis not present

## 2022-05-31 DIAGNOSIS — Z8711 Personal history of peptic ulcer disease: Secondary | ICD-10-CM | POA: Diagnosis not present

## 2022-05-31 DIAGNOSIS — I998 Other disorder of circulatory system: Secondary | ICD-10-CM | POA: Diagnosis not present

## 2022-05-31 DIAGNOSIS — Z7401 Bed confinement status: Secondary | ICD-10-CM | POA: Diagnosis not present

## 2022-05-31 DIAGNOSIS — J45909 Unspecified asthma, uncomplicated: Secondary | ICD-10-CM | POA: Diagnosis not present

## 2022-05-31 DIAGNOSIS — E46 Unspecified protein-calorie malnutrition: Secondary | ICD-10-CM | POA: Diagnosis not present

## 2022-05-31 DIAGNOSIS — I959 Hypotension, unspecified: Secondary | ICD-10-CM | POA: Diagnosis not present

## 2022-05-31 DIAGNOSIS — K449 Diaphragmatic hernia without obstruction or gangrene: Secondary | ICD-10-CM | POA: Diagnosis not present

## 2022-05-31 DIAGNOSIS — E876 Hypokalemia: Secondary | ICD-10-CM | POA: Diagnosis not present

## 2022-05-31 DIAGNOSIS — I482 Chronic atrial fibrillation, unspecified: Secondary | ICD-10-CM | POA: Diagnosis not present

## 2022-05-31 LAB — HM COLONOSCOPY

## 2022-05-31 NOTE — Telephone Encounter (Signed)
[name] is wanting a cb concerning pt being on two medications, DULoxetine (CYMBALTA) 30 MG capsule [[id number]] and DULoxetine (CYMBALTA) 30 MG capsule [[id number]] at the same time. Please advise her at [phone]

## 2022-06-06 DIAGNOSIS — Z7401 Bed confinement status: Secondary | ICD-10-CM | POA: Diagnosis not present

## 2022-06-06 DIAGNOSIS — I4891 Unspecified atrial fibrillation: Secondary | ICD-10-CM | POA: Diagnosis not present

## 2022-06-06 DIAGNOSIS — K921 Melena: Secondary | ICD-10-CM | POA: Diagnosis not present

## 2022-06-06 DIAGNOSIS — E46 Unspecified protein-calorie malnutrition: Secondary | ICD-10-CM | POA: Diagnosis not present

## 2022-06-06 DIAGNOSIS — N186 End stage renal disease: Secondary | ICD-10-CM | POA: Diagnosis not present

## 2022-06-06 DIAGNOSIS — Z8719 Personal history of other diseases of the digestive system: Secondary | ICD-10-CM | POA: Diagnosis not present

## 2022-06-06 DIAGNOSIS — K5730 Diverticulosis of large intestine without perforation or abscess without bleeding: Secondary | ICD-10-CM | POA: Diagnosis not present

## 2022-06-06 DIAGNOSIS — Z95818 Presence of other cardiac implants and grafts: Secondary | ICD-10-CM | POA: Diagnosis not present

## 2022-06-06 DIAGNOSIS — D631 Anemia in chronic kidney disease: Secondary | ICD-10-CM | POA: Diagnosis not present

## 2022-06-06 DIAGNOSIS — R609 Edema, unspecified: Secondary | ICD-10-CM | POA: Diagnosis not present

## 2022-06-06 DIAGNOSIS — E872 Acidosis, unspecified: Secondary | ICD-10-CM | POA: Diagnosis not present

## 2022-06-06 DIAGNOSIS — I6930 Unspecified sequelae of cerebral infarction: Secondary | ICD-10-CM | POA: Diagnosis not present

## 2022-06-06 DIAGNOSIS — K649 Unspecified hemorrhoids: Secondary | ICD-10-CM | POA: Diagnosis not present

## 2022-06-06 DIAGNOSIS — I132 Hypertensive heart and chronic kidney disease with heart failure and with stage 5 chronic kidney disease, or end stage renal disease: Secondary | ICD-10-CM | POA: Diagnosis not present

## 2022-06-06 DIAGNOSIS — R600 Localized edema: Secondary | ICD-10-CM | POA: Diagnosis not present

## 2022-06-06 DIAGNOSIS — N185 Chronic kidney disease, stage 5: Secondary | ICD-10-CM | POA: Diagnosis not present

## 2022-06-06 DIAGNOSIS — E785 Hyperlipidemia, unspecified: Secondary | ICD-10-CM | POA: Diagnosis present

## 2022-06-06 DIAGNOSIS — R5381 Other malaise: Secondary | ICD-10-CM | POA: Diagnosis not present

## 2022-06-06 DIAGNOSIS — K648 Other hemorrhoids: Secondary | ICD-10-CM | POA: Diagnosis not present

## 2022-06-06 DIAGNOSIS — Z96641 Presence of right artificial hip joint: Secondary | ICD-10-CM | POA: Diagnosis present

## 2022-06-06 DIAGNOSIS — E1122 Type 2 diabetes mellitus with diabetic chronic kidney disease: Secondary | ICD-10-CM | POA: Diagnosis present

## 2022-06-06 DIAGNOSIS — Z950 Presence of cardiac pacemaker: Secondary | ICD-10-CM | POA: Diagnosis not present

## 2022-06-06 DIAGNOSIS — F32A Depression, unspecified: Secondary | ICD-10-CM | POA: Diagnosis not present

## 2022-06-06 DIAGNOSIS — Z79899 Other long term (current) drug therapy: Secondary | ICD-10-CM | POA: Diagnosis not present

## 2022-06-06 DIAGNOSIS — I2510 Atherosclerotic heart disease of native coronary artery without angina pectoris: Secondary | ICD-10-CM | POA: Diagnosis present

## 2022-06-06 DIAGNOSIS — T8612 Kidney transplant failure: Secondary | ICD-10-CM | POA: Diagnosis present

## 2022-06-06 DIAGNOSIS — E8770 Fluid overload, unspecified: Secondary | ICD-10-CM | POA: Diagnosis not present

## 2022-06-06 DIAGNOSIS — R0989 Other specified symptoms and signs involving the circulatory and respiratory systems: Secondary | ICD-10-CM | POA: Diagnosis not present

## 2022-06-06 DIAGNOSIS — D62 Acute posthemorrhagic anemia: Secondary | ICD-10-CM | POA: Diagnosis not present

## 2022-06-06 DIAGNOSIS — Z681 Body mass index (BMI) 19 or less, adult: Secondary | ICD-10-CM | POA: Diagnosis not present

## 2022-06-06 DIAGNOSIS — K625 Hemorrhage of anus and rectum: Secondary | ICD-10-CM | POA: Diagnosis not present

## 2022-06-06 DIAGNOSIS — I120 Hypertensive chronic kidney disease with stage 5 chronic kidney disease or end stage renal disease: Secondary | ICD-10-CM | POA: Diagnosis not present

## 2022-06-06 DIAGNOSIS — I82409 Acute embolism and thrombosis of unspecified deep veins of unspecified lower extremity: Secondary | ICD-10-CM | POA: Diagnosis not present

## 2022-06-06 DIAGNOSIS — Z940 Kidney transplant status: Secondary | ICD-10-CM | POA: Diagnosis not present

## 2022-06-06 DIAGNOSIS — I639 Cerebral infarction, unspecified: Secondary | ICD-10-CM | POA: Diagnosis not present

## 2022-06-06 DIAGNOSIS — Z8701 Personal history of pneumonia (recurrent): Secondary | ICD-10-CM | POA: Diagnosis not present

## 2022-06-06 DIAGNOSIS — J811 Chronic pulmonary edema: Secondary | ICD-10-CM | POA: Diagnosis not present

## 2022-06-06 DIAGNOSIS — Z86718 Personal history of other venous thrombosis and embolism: Secondary | ICD-10-CM | POA: Diagnosis not present

## 2022-06-06 DIAGNOSIS — N184 Chronic kidney disease, stage 4 (severe): Secondary | ICD-10-CM | POA: Diagnosis present

## 2022-06-06 DIAGNOSIS — E8722 Chronic metabolic acidosis: Secondary | ICD-10-CM | POA: Diagnosis not present

## 2022-06-06 DIAGNOSIS — I509 Heart failure, unspecified: Secondary | ICD-10-CM | POA: Diagnosis not present

## 2022-06-06 DIAGNOSIS — I482 Chronic atrial fibrillation, unspecified: Secondary | ICD-10-CM | POA: Diagnosis not present

## 2022-06-06 DIAGNOSIS — D649 Anemia, unspecified: Secondary | ICD-10-CM | POA: Diagnosis not present

## 2022-06-06 DIAGNOSIS — Z95828 Presence of other vascular implants and grafts: Secondary | ICD-10-CM | POA: Diagnosis not present

## 2022-06-06 DIAGNOSIS — J45909 Unspecified asthma, uncomplicated: Secondary | ICD-10-CM | POA: Diagnosis not present

## 2022-06-06 DIAGNOSIS — J9811 Atelectasis: Secondary | ICD-10-CM | POA: Diagnosis not present

## 2022-06-06 DIAGNOSIS — K922 Gastrointestinal hemorrhage, unspecified: Secondary | ICD-10-CM | POA: Diagnosis not present

## 2022-06-06 DIAGNOSIS — R58 Hemorrhage, not elsewhere classified: Secondary | ICD-10-CM | POA: Diagnosis not present

## 2022-06-06 DIAGNOSIS — R627 Adult failure to thrive: Secondary | ICD-10-CM | POA: Diagnosis not present

## 2022-06-06 DIAGNOSIS — N189 Chronic kidney disease, unspecified: Secondary | ICD-10-CM | POA: Diagnosis not present

## 2022-06-06 DIAGNOSIS — E876 Hypokalemia: Secondary | ICD-10-CM | POA: Diagnosis not present

## 2022-06-06 DIAGNOSIS — D696 Thrombocytopenia, unspecified: Secondary | ICD-10-CM | POA: Diagnosis not present

## 2022-06-06 DIAGNOSIS — Z7982 Long term (current) use of aspirin: Secondary | ICD-10-CM | POA: Diagnosis not present

## 2022-06-06 DIAGNOSIS — J4489 Other specified chronic obstructive pulmonary disease: Secondary | ICD-10-CM | POA: Diagnosis present

## 2022-06-12 ENCOUNTER — Telehealth: Payer: Self-pay | Admitting: Nurse Practitioner

## 2022-06-12 NOTE — Telephone Encounter (Signed)
Mr peplinski would like you to call him mrs wall

## 2022-06-12 NOTE — Telephone Encounter (Signed)
Left VM, adv pt to call back

## 2022-06-13 ENCOUNTER — Encounter (HOSPITAL_COMMUNITY)
Admission: RE | Admit: 2022-06-13 | Discharge: 2022-06-13 | Disposition: A | Discharge status: Home / Self Care | Payer: No Typology Code available for payment source | Source: Ambulatory Visit | Attending: Nephrology | Admitting: Nephrology

## 2022-06-13 ENCOUNTER — Telehealth: Payer: Self-pay | Admitting: Nurse Practitioner

## 2022-06-13 ENCOUNTER — Encounter (HOSPITAL_COMMUNITY): Payer: Medicare Other

## 2022-06-13 VITALS — BP 126/80 | HR 70 | Temp 98.3°F | Resp 17

## 2022-06-13 DIAGNOSIS — K922 Gastrointestinal hemorrhage, unspecified: Secondary | ICD-10-CM | POA: Diagnosis not present

## 2022-06-13 DIAGNOSIS — I639 Cerebral infarction, unspecified: Secondary | ICD-10-CM | POA: Diagnosis not present

## 2022-06-13 DIAGNOSIS — F32A Depression, unspecified: Secondary | ICD-10-CM | POA: Diagnosis not present

## 2022-06-13 DIAGNOSIS — N185 Chronic kidney disease, stage 5: Secondary | ICD-10-CM | POA: Diagnosis not present

## 2022-06-13 DIAGNOSIS — K649 Unspecified hemorrhoids: Secondary | ICD-10-CM | POA: Diagnosis not present

## 2022-06-13 LAB — POCT HEMOGLOBIN-HEMACUE: Hemoglobin: 9 g/dL — ABNORMAL LOW (ref 12.0–15.0)

## 2022-06-13 MED ORDER — EPOETIN ALFA-EPBX 10000 UNIT/ML IJ SOLN: 10000.0000 [IU] | INTRAMUSCULAR | Status: DC

## 2022-06-13 MED ORDER — EPOETIN ALFA-EPBX 10000 UNIT/ML IJ SOLN
INTRAMUSCULAR | Status: AC
  Administered 2022-06-13: 10000 [IU] via SUBCUTANEOUS
  Filled 2022-06-13: qty 1

## 2022-06-13 NOTE — Telephone Encounter (Signed)
Can you give mr Frei a call

## 2022-06-13 NOTE — Telephone Encounter (Signed)
Called & spoke /w pt's husband, says someone from here called him about making an appt for [name], adv Mr. Rojelio Brenner that no one form here called recently.

## 2022-06-15 DIAGNOSIS — K649 Unspecified hemorrhoids: Secondary | ICD-10-CM | POA: Diagnosis not present

## 2022-06-15 DIAGNOSIS — I639 Cerebral infarction, unspecified: Secondary | ICD-10-CM | POA: Diagnosis not present

## 2022-06-15 DIAGNOSIS — R5381 Other malaise: Secondary | ICD-10-CM | POA: Diagnosis not present

## 2022-06-15 DIAGNOSIS — I82409 Acute embolism and thrombosis of unspecified deep veins of unspecified lower extremity: Secondary | ICD-10-CM | POA: Diagnosis not present

## 2022-06-15 DIAGNOSIS — I4891 Unspecified atrial fibrillation: Secondary | ICD-10-CM | POA: Diagnosis not present

## 2022-06-15 DIAGNOSIS — R600 Localized edema: Secondary | ICD-10-CM | POA: Diagnosis not present

## 2022-06-15 DIAGNOSIS — K922 Gastrointestinal hemorrhage, unspecified: Secondary | ICD-10-CM | POA: Diagnosis not present

## 2022-06-18 ENCOUNTER — Telehealth: Payer: Self-pay

## 2022-06-18 NOTE — Progress Notes (Signed)
Chronic Care Management Pharmacy Assistant   Name: KEYSI OELKERS  MRN: [id number] DOB: [birth year]  Reason for Encounter:Hypertension Disease State Call.   Recent office visits:  None ID  Recent consult visits:   None ID  Hospital visits:  Medication Reconciliation was completed by comparing discharge summary, patient's EMR and Pharmacy list, and upon discussion with patient.  Admitted to the hospital on 05/31/2022 due to Rectal Hemorrhage. Discharge date was 05/31/2022. Discharged from [facility].    Unable to see note.  Medications: Outpatient Encounter Medications as of 06/18/2022  Medication Sig Note   acetaminophen (TYLENOL) 500 MG tablet Take 500 mg by mouth every 6 (six) hours as needed for mild pain or headache.     albuterol (PROVENTIL) (2.5 MG/3ML) 0.083% nebulizer solution Take 3 mLs (2.5 mg total) by nebulization every 6 (six) hours as needed for wheezing or shortness of breath. Dx:J45.909    aspirin EC 81 MG tablet Take 81 mg by mouth daily. Swallow whole.    atorvastatin (LIPITOR) 20 MG tablet TAKE 1 TABLET BY MOUTH EVERY DAY    budesonide-formoterol (SYMBICORT) 160-4.5 MCG/ACT inhaler Inhale 2 puffs into the lungs 2 (two) times daily. (Patient taking differently: Inhale 2 puffs into the lungs 2 (two) times daily as needed (shortness of breath).) 12/13/2021: Patient states she takes only as needed.    calcitonin, salmon, (MIACALCIN/FORTICAL) 200 UNIT/ACT nasal spray PLACE 1 SPRAY INTO ALTERNATE NOSTRILS DAILY.    carvedilol (COREG) 3.125 MG tablet Take 1 tablet (3.125 mg total) by mouth 2 (two) times daily with a meal. 05/07/2022: UNK time   docusate sodium (COLACE) 100 MG capsule Take 100 mg by mouth daily as needed for mild constipation.    DULoxetine (CYMBALTA) 30 MG capsule TAKE 1 CAPSULE BY MOUTH EVERY DAY (Patient taking differently: Take 30 mg by mouth daily.) 05/07/2022: Newly prescribed (not taking yet)   FLUoxetine (PROZAC) 20 MG  capsule 2 qam (Patient taking differently: Take 20 mg by mouth daily.)    furosemide (LASIX) 80 MG tablet Take 80 mg by mouth 3 (three) times a week. M, W,F    lactulose (CHRONULAC) 10 GM/15ML solution Take 30 mLs (20 g total) by mouth daily as needed for mild constipation or severe constipation.    mycophenolate (MYFORTIC) 180 MG EC tablet Take 180 mg by mouth 2 (two) times daily.    ondansetron (ZOFRAN) 4 MG tablet Take 1 tablet (4 mg total) by mouth every 8 (eight) hours as needed for up to 9 doses for nausea.    pantoprazole (PROTONIX) 20 MG tablet TAKE 1 TABLET BY MOUTH EVERY DAY    polyethylene glycol (MIRALAX / GLYCOLAX) 17 g packet Take 17 g by mouth daily. Hold if diarrhea    potassium chloride (MICRO-K) 10 MEQ CR capsule Take 10 mEq by mouth 3 (three) times a week. M, W,F    predniSONE (DELTASONE) 5 MG tablet Take 5 mg by mouth daily.    sodium bicarbonate 650 MG tablet Take 650 mg by mouth 2 (two) times daily.    tacrolimus (PROGRAF) 1 MG capsule Take 1 mg by mouth 2 (two) times daily.    No facility-administered encounter medications on file as of 06/18/2022.    Care Gaps: COVID-19 Vaccine (4- Booster) Mammogram   Star Rating Drugs: Atorvastatin 20 mg last filled 06/06/2022 30 day supply at [place]  Reviewed chart prior to disease state call. Spoke with patient regarding BP  Recent Office Vitals: BP Readings  from Last 3 Encounters:  06/13/22 126/80  05/30/22 (!) 167/81  05/16/22 133/77   Pulse Readings from Last 3 Encounters:  06/13/22 70  05/30/22 70  05/16/22 70    Wt Readings from Last 3 Encounters:  05/08/22 115 lb 8.3 oz (52.4 kg)  04/26/22 112 lb (50.8 kg)  04/19/22 112 lb (50.8 kg)     Kidney Function Lab Results  Component Value Date/Time   CREATININE 3.41 (H) 05/09/2022 08:55 AM   CREATININE 3.33 (H) 05/07/2022 04:08 AM   GFR 15.12 (L) 03/01/2022 12:41 PM   GFRNONAA 14 (L) 05/09/2022 08:55 AM   GFRAA 24 (L) 03/13/2020 11:18 AM        Latest Ref Rng & Units 05/09/2022    8:55 AM 05/07/2022    4:08 AM 05/05/2022   10:43 AM  BMP  Glucose 70 - 99 mg/dL 92  81  111   BUN 8 - 23 mg/dL 59  55  48   Creatinine 0.44 - 1.00 mg/dL 3.41  3.33  3.41   Sodium 135 - 145 mmol/L 139  138  137   Potassium 3.5 - 5.1 mmol/L 5.3  5.1  4.5   Chloride 98 - 111 mmol/L 109  107  107   CO2 22 - 32 mmol/L '17  19  25   '$ Calcium 8.9 - 10.3 mg/dL 9.1  9.4  9.0     Current antihypertensive regimen:  Carvedilol 3.125 mg twice daily  Furosemide 80 mg three times weekly  How often are you checking your Blood Pressure? {CHL HP BP Monitoring [phone]}  Current home BP readings: ***  What recent interventions/DTPs have been made by any provider to improve Blood Pressure control since last CPP Visit: None ID  Any recent hospitalizations or ED visits since last visit with CPP? Yes  What diet changes have been made to improve Blood Pressure Control?  ***  What exercise is being done to improve your Blood Pressure Control?  ***  Adherence Review: Is the patient currently on ACE/ARB medication? No Does the patient have >5 day gap between last estimated fill dates? No   Anderson Malta Clinical Production designer, theatre/television/film [phone]

## 2022-06-20 DIAGNOSIS — F32A Depression, unspecified: Secondary | ICD-10-CM | POA: Diagnosis not present

## 2022-06-27 ENCOUNTER — Encounter (HOSPITAL_COMMUNITY): Payer: Medicare Other

## 2022-06-28 ENCOUNTER — Encounter (HOSPITAL_COMMUNITY): Payer: Self-pay | Admitting: Internal Medicine

## 2022-07-01 DIAGNOSIS — R609 Edema, unspecified: Secondary | ICD-10-CM | POA: Diagnosis not present

## 2022-07-03 ENCOUNTER — Telehealth (HOSPITAL_COMMUNITY): Payer: Self-pay

## 2022-07-03 DIAGNOSIS — Z7982 Long term (current) use of aspirin: Secondary | ICD-10-CM | POA: Diagnosis not present

## 2022-07-03 DIAGNOSIS — Z95828 Presence of other vascular implants and grafts: Secondary | ICD-10-CM | POA: Diagnosis not present

## 2022-07-03 DIAGNOSIS — R0989 Other specified symptoms and signs involving the circulatory and respiratory systems: Secondary | ICD-10-CM | POA: Diagnosis present

## 2022-07-03 DIAGNOSIS — F32A Depression, unspecified: Secondary | ICD-10-CM | POA: Diagnosis present

## 2022-07-03 DIAGNOSIS — N185 Chronic kidney disease, stage 5: Secondary | ICD-10-CM | POA: Diagnosis not present

## 2022-07-03 DIAGNOSIS — Z95818 Presence of other cardiac implants and grafts: Secondary | ICD-10-CM | POA: Diagnosis not present

## 2022-07-03 DIAGNOSIS — E8770 Fluid overload, unspecified: Secondary | ICD-10-CM | POA: Diagnosis not present

## 2022-07-03 DIAGNOSIS — K648 Other hemorrhoids: Secondary | ICD-10-CM | POA: Diagnosis not present

## 2022-07-03 DIAGNOSIS — J9811 Atelectasis: Secondary | ICD-10-CM | POA: Diagnosis present

## 2022-07-03 DIAGNOSIS — Z79899 Other long term (current) drug therapy: Secondary | ICD-10-CM | POA: Diagnosis not present

## 2022-07-03 DIAGNOSIS — K922 Gastrointestinal hemorrhage, unspecified: Secondary | ICD-10-CM | POA: Diagnosis not present

## 2022-07-03 DIAGNOSIS — I4891 Unspecified atrial fibrillation: Secondary | ICD-10-CM | POA: Diagnosis present

## 2022-07-03 DIAGNOSIS — I132 Hypertensive heart and chronic kidney disease with heart failure and with stage 5 chronic kidney disease, or end stage renal disease: Secondary | ICD-10-CM | POA: Diagnosis present

## 2022-07-03 DIAGNOSIS — Z8701 Personal history of pneumonia (recurrent): Secondary | ICD-10-CM | POA: Diagnosis not present

## 2022-07-03 DIAGNOSIS — I120 Hypertensive chronic kidney disease with stage 5 chronic kidney disease or end stage renal disease: Secondary | ICD-10-CM | POA: Diagnosis not present

## 2022-07-03 DIAGNOSIS — Z8719 Personal history of other diseases of the digestive system: Secondary | ICD-10-CM | POA: Diagnosis not present

## 2022-07-03 DIAGNOSIS — E785 Hyperlipidemia, unspecified: Secondary | ICD-10-CM | POA: Diagnosis present

## 2022-07-03 DIAGNOSIS — J45909 Unspecified asthma, uncomplicated: Secondary | ICD-10-CM | POA: Diagnosis not present

## 2022-07-03 DIAGNOSIS — E1122 Type 2 diabetes mellitus with diabetic chronic kidney disease: Secondary | ICD-10-CM | POA: Diagnosis present

## 2022-07-03 DIAGNOSIS — J4489 Other specified chronic obstructive pulmonary disease: Secondary | ICD-10-CM | POA: Diagnosis present

## 2022-07-03 DIAGNOSIS — Z86718 Personal history of other venous thrombosis and embolism: Secondary | ICD-10-CM | POA: Diagnosis not present

## 2022-07-03 DIAGNOSIS — N189 Chronic kidney disease, unspecified: Secondary | ICD-10-CM | POA: Diagnosis not present

## 2022-07-03 DIAGNOSIS — R58 Hemorrhage, not elsewhere classified: Secondary | ICD-10-CM | POA: Diagnosis not present

## 2022-07-03 DIAGNOSIS — R627 Adult failure to thrive: Secondary | ICD-10-CM | POA: Diagnosis not present

## 2022-07-03 DIAGNOSIS — D696 Thrombocytopenia, unspecified: Secondary | ICD-10-CM | POA: Diagnosis present

## 2022-07-03 DIAGNOSIS — J449 Chronic obstructive pulmonary disease, unspecified: Secondary | ICD-10-CM | POA: Diagnosis not present

## 2022-07-03 DIAGNOSIS — Z96641 Presence of right artificial hip joint: Secondary | ICD-10-CM | POA: Diagnosis present

## 2022-07-03 DIAGNOSIS — I482 Chronic atrial fibrillation, unspecified: Secondary | ICD-10-CM | POA: Diagnosis not present

## 2022-07-03 DIAGNOSIS — K921 Melena: Secondary | ICD-10-CM | POA: Diagnosis present

## 2022-07-03 DIAGNOSIS — Z940 Kidney transplant status: Secondary | ICD-10-CM | POA: Diagnosis not present

## 2022-07-03 DIAGNOSIS — I509 Heart failure, unspecified: Secondary | ICD-10-CM | POA: Diagnosis not present

## 2022-07-03 DIAGNOSIS — E8722 Chronic metabolic acidosis: Secondary | ICD-10-CM | POA: Diagnosis not present

## 2022-07-03 DIAGNOSIS — E46 Unspecified protein-calorie malnutrition: Secondary | ICD-10-CM | POA: Diagnosis not present

## 2022-07-03 DIAGNOSIS — N186 End stage renal disease: Secondary | ICD-10-CM | POA: Diagnosis not present

## 2022-07-03 DIAGNOSIS — I6930 Unspecified sequelae of cerebral infarction: Secondary | ICD-10-CM | POA: Diagnosis not present

## 2022-07-03 DIAGNOSIS — D62 Acute posthemorrhagic anemia: Secondary | ICD-10-CM | POA: Diagnosis present

## 2022-07-03 DIAGNOSIS — N184 Chronic kidney disease, stage 4 (severe): Secondary | ICD-10-CM | POA: Diagnosis present

## 2022-07-03 DIAGNOSIS — D631 Anemia in chronic kidney disease: Secondary | ICD-10-CM | POA: Diagnosis not present

## 2022-07-03 DIAGNOSIS — I2510 Atherosclerotic heart disease of native coronary artery without angina pectoris: Secondary | ICD-10-CM | POA: Diagnosis present

## 2022-07-03 DIAGNOSIS — D649 Anemia, unspecified: Secondary | ICD-10-CM | POA: Diagnosis not present

## 2022-07-03 DIAGNOSIS — K625 Hemorrhage of anus and rectum: Secondary | ICD-10-CM | POA: Diagnosis not present

## 2022-07-03 DIAGNOSIS — T8612 Kidney transplant failure: Secondary | ICD-10-CM | POA: Diagnosis present

## 2022-07-03 DIAGNOSIS — Z743 Need for continuous supervision: Secondary | ICD-10-CM | POA: Diagnosis not present

## 2022-07-03 DIAGNOSIS — Z950 Presence of cardiac pacemaker: Secondary | ICD-10-CM | POA: Diagnosis not present

## 2022-07-03 DIAGNOSIS — E876 Hypokalemia: Secondary | ICD-10-CM | POA: Diagnosis not present

## 2022-07-03 DIAGNOSIS — J811 Chronic pulmonary edema: Secondary | ICD-10-CM | POA: Diagnosis not present

## 2022-07-03 DIAGNOSIS — K5730 Diverticulosis of large intestine without perforation or abscess without bleeding: Secondary | ICD-10-CM | POA: Diagnosis not present

## 2022-07-03 NOTE — Telephone Encounter (Signed)
Called referring office to let them know that Dr. Estanislado Pandy is recommending a MRI lumbar. No answer, left vm for Janella. AW

## 2022-07-04 ENCOUNTER — Ambulatory Visit (HOSPITAL_COMMUNITY): Payer: Medicare Other | Admitting: Psychiatry

## 2022-07-04 DIAGNOSIS — J449 Chronic obstructive pulmonary disease, unspecified: Secondary | ICD-10-CM | POA: Diagnosis not present

## 2022-07-04 DIAGNOSIS — E8770 Fluid overload, unspecified: Secondary | ICD-10-CM | POA: Diagnosis not present

## 2022-07-04 DIAGNOSIS — N185 Chronic kidney disease, stage 5: Secondary | ICD-10-CM | POA: Diagnosis not present

## 2022-07-04 DIAGNOSIS — E785 Hyperlipidemia, unspecified: Secondary | ICD-10-CM | POA: Diagnosis not present

## 2022-07-04 DIAGNOSIS — D649 Anemia, unspecified: Secondary | ICD-10-CM | POA: Diagnosis not present

## 2022-07-04 DIAGNOSIS — K922 Gastrointestinal hemorrhage, unspecified: Secondary | ICD-10-CM | POA: Diagnosis not present

## 2022-07-04 DIAGNOSIS — J45909 Unspecified asthma, uncomplicated: Secondary | ICD-10-CM | POA: Diagnosis not present

## 2022-07-04 DIAGNOSIS — F32A Depression, unspecified: Secondary | ICD-10-CM | POA: Diagnosis not present

## 2022-07-04 DIAGNOSIS — I120 Hypertensive chronic kidney disease with stage 5 chronic kidney disease or end stage renal disease: Secondary | ICD-10-CM | POA: Diagnosis not present

## 2022-07-04 DIAGNOSIS — Z940 Kidney transplant status: Secondary | ICD-10-CM | POA: Diagnosis not present

## 2022-07-05 DIAGNOSIS — Z940 Kidney transplant status: Secondary | ICD-10-CM | POA: Diagnosis not present

## 2022-07-05 DIAGNOSIS — Z79899 Other long term (current) drug therapy: Secondary | ICD-10-CM | POA: Diagnosis not present

## 2022-07-05 DIAGNOSIS — J9811 Atelectasis: Secondary | ICD-10-CM | POA: Diagnosis present

## 2022-07-05 DIAGNOSIS — F32A Depression, unspecified: Secondary | ICD-10-CM | POA: Diagnosis present

## 2022-07-05 DIAGNOSIS — K625 Hemorrhage of anus and rectum: Secondary | ICD-10-CM | POA: Diagnosis not present

## 2022-07-05 DIAGNOSIS — Z8701 Personal history of pneumonia (recurrent): Secondary | ICD-10-CM | POA: Diagnosis not present

## 2022-07-05 DIAGNOSIS — E46 Unspecified protein-calorie malnutrition: Secondary | ICD-10-CM | POA: Diagnosis not present

## 2022-07-05 DIAGNOSIS — D696 Thrombocytopenia, unspecified: Secondary | ICD-10-CM | POA: Diagnosis present

## 2022-07-05 DIAGNOSIS — E785 Hyperlipidemia, unspecified: Secondary | ICD-10-CM | POA: Diagnosis present

## 2022-07-05 DIAGNOSIS — T8612 Kidney transplant failure: Secondary | ICD-10-CM | POA: Diagnosis present

## 2022-07-05 DIAGNOSIS — I6930 Unspecified sequelae of cerebral infarction: Secondary | ICD-10-CM | POA: Diagnosis not present

## 2022-07-05 DIAGNOSIS — Z743 Need for continuous supervision: Secondary | ICD-10-CM | POA: Diagnosis not present

## 2022-07-05 DIAGNOSIS — Z86718 Personal history of other venous thrombosis and embolism: Secondary | ICD-10-CM | POA: Diagnosis not present

## 2022-07-05 DIAGNOSIS — I4891 Unspecified atrial fibrillation: Secondary | ICD-10-CM | POA: Diagnosis present

## 2022-07-05 DIAGNOSIS — D62 Acute posthemorrhagic anemia: Secondary | ICD-10-CM | POA: Diagnosis not present

## 2022-07-05 DIAGNOSIS — R627 Adult failure to thrive: Secondary | ICD-10-CM | POA: Diagnosis not present

## 2022-07-05 DIAGNOSIS — E8770 Fluid overload, unspecified: Secondary | ICD-10-CM | POA: Diagnosis not present

## 2022-07-05 DIAGNOSIS — J45909 Unspecified asthma, uncomplicated: Secondary | ICD-10-CM | POA: Diagnosis not present

## 2022-07-05 DIAGNOSIS — N185 Chronic kidney disease, stage 5: Secondary | ICD-10-CM | POA: Diagnosis not present

## 2022-07-05 DIAGNOSIS — E876 Hypokalemia: Secondary | ICD-10-CM | POA: Diagnosis not present

## 2022-07-05 DIAGNOSIS — I132 Hypertensive heart and chronic kidney disease with heart failure and with stage 5 chronic kidney disease, or end stage renal disease: Secondary | ICD-10-CM | POA: Diagnosis not present

## 2022-07-05 DIAGNOSIS — Z7982 Long term (current) use of aspirin: Secondary | ICD-10-CM | POA: Diagnosis not present

## 2022-07-05 DIAGNOSIS — R58 Hemorrhage, not elsewhere classified: Secondary | ICD-10-CM | POA: Diagnosis not present

## 2022-07-05 DIAGNOSIS — R0989 Other specified symptoms and signs involving the circulatory and respiratory systems: Secondary | ICD-10-CM | POA: Diagnosis present

## 2022-07-05 DIAGNOSIS — Z96641 Presence of right artificial hip joint: Secondary | ICD-10-CM | POA: Diagnosis present

## 2022-07-05 DIAGNOSIS — Z950 Presence of cardiac pacemaker: Secondary | ICD-10-CM | POA: Diagnosis not present

## 2022-07-05 DIAGNOSIS — N184 Chronic kidney disease, stage 4 (severe): Secondary | ICD-10-CM | POA: Diagnosis present

## 2022-07-05 DIAGNOSIS — E1122 Type 2 diabetes mellitus with diabetic chronic kidney disease: Secondary | ICD-10-CM | POA: Diagnosis present

## 2022-07-05 DIAGNOSIS — E8722 Chronic metabolic acidosis: Secondary | ICD-10-CM | POA: Diagnosis not present

## 2022-07-05 DIAGNOSIS — K921 Melena: Secondary | ICD-10-CM | POA: Diagnosis present

## 2022-07-05 DIAGNOSIS — Z95828 Presence of other vascular implants and grafts: Secondary | ICD-10-CM | POA: Diagnosis not present

## 2022-07-05 DIAGNOSIS — J449 Chronic obstructive pulmonary disease, unspecified: Secondary | ICD-10-CM | POA: Diagnosis not present

## 2022-07-05 DIAGNOSIS — K648 Other hemorrhoids: Secondary | ICD-10-CM | POA: Diagnosis present

## 2022-07-05 DIAGNOSIS — K922 Gastrointestinal hemorrhage, unspecified: Secondary | ICD-10-CM | POA: Diagnosis not present

## 2022-07-05 DIAGNOSIS — D649 Anemia, unspecified: Secondary | ICD-10-CM | POA: Diagnosis not present

## 2022-07-05 DIAGNOSIS — I482 Chronic atrial fibrillation, unspecified: Secondary | ICD-10-CM | POA: Diagnosis not present

## 2022-07-05 DIAGNOSIS — I509 Heart failure, unspecified: Secondary | ICD-10-CM | POA: Diagnosis present

## 2022-07-05 DIAGNOSIS — I2510 Atherosclerotic heart disease of native coronary artery without angina pectoris: Secondary | ICD-10-CM | POA: Diagnosis present

## 2022-07-05 DIAGNOSIS — Z95818 Presence of other cardiac implants and grafts: Secondary | ICD-10-CM | POA: Diagnosis not present

## 2022-07-05 DIAGNOSIS — J4489 Other specified chronic obstructive pulmonary disease: Secondary | ICD-10-CM | POA: Diagnosis present

## 2022-07-06 DIAGNOSIS — E8722 Chronic metabolic acidosis: Secondary | ICD-10-CM | POA: Diagnosis not present

## 2022-07-06 DIAGNOSIS — R627 Adult failure to thrive: Secondary | ICD-10-CM | POA: Diagnosis not present

## 2022-07-06 DIAGNOSIS — Z86718 Personal history of other venous thrombosis and embolism: Secondary | ICD-10-CM | POA: Diagnosis not present

## 2022-07-06 DIAGNOSIS — Z950 Presence of cardiac pacemaker: Secondary | ICD-10-CM | POA: Diagnosis not present

## 2022-07-06 DIAGNOSIS — G9340 Encephalopathy, unspecified: Secondary | ICD-10-CM | POA: Diagnosis not present

## 2022-07-06 DIAGNOSIS — E43 Unspecified severe protein-calorie malnutrition: Secondary | ICD-10-CM | POA: Diagnosis not present

## 2022-07-06 DIAGNOSIS — E876 Hypokalemia: Secondary | ICD-10-CM | POA: Diagnosis not present

## 2022-07-06 DIAGNOSIS — N179 Acute kidney failure, unspecified: Secondary | ICD-10-CM | POA: Diagnosis not present

## 2022-07-06 DIAGNOSIS — I5023 Acute on chronic systolic (congestive) heart failure: Secondary | ICD-10-CM | POA: Diagnosis not present

## 2022-07-06 DIAGNOSIS — D62 Acute posthemorrhagic anemia: Secondary | ICD-10-CM | POA: Diagnosis not present

## 2022-07-06 DIAGNOSIS — R64 Cachexia: Secondary | ICD-10-CM | POA: Diagnosis not present

## 2022-07-06 DIAGNOSIS — J690 Pneumonitis due to inhalation of food and vomit: Secondary | ICD-10-CM | POA: Diagnosis not present

## 2022-07-06 DIAGNOSIS — Z743 Need for continuous supervision: Secondary | ICD-10-CM | POA: Diagnosis not present

## 2022-07-06 DIAGNOSIS — Z66 Do not resuscitate: Secondary | ICD-10-CM | POA: Diagnosis not present

## 2022-07-06 DIAGNOSIS — G9349 Other encephalopathy: Secondary | ICD-10-CM | POA: Diagnosis not present

## 2022-07-06 DIAGNOSIS — I4811 Longstanding persistent atrial fibrillation: Secondary | ICD-10-CM | POA: Diagnosis not present

## 2022-07-06 DIAGNOSIS — R4182 Altered mental status, unspecified: Secondary | ICD-10-CM | POA: Diagnosis not present

## 2022-07-06 DIAGNOSIS — L89151 Pressure ulcer of sacral region, stage 1: Secondary | ICD-10-CM | POA: Diagnosis not present

## 2022-07-06 DIAGNOSIS — R609 Edema, unspecified: Secondary | ICD-10-CM | POA: Diagnosis not present

## 2022-07-06 DIAGNOSIS — I272 Pulmonary hypertension, unspecified: Secondary | ICD-10-CM | POA: Diagnosis not present

## 2022-07-06 DIAGNOSIS — J90 Pleural effusion, not elsewhere classified: Secondary | ICD-10-CM | POA: Diagnosis not present

## 2022-07-06 DIAGNOSIS — N3000 Acute cystitis without hematuria: Secondary | ICD-10-CM | POA: Diagnosis not present

## 2022-07-06 DIAGNOSIS — E162 Hypoglycemia, unspecified: Secondary | ICD-10-CM | POA: Diagnosis not present

## 2022-07-06 DIAGNOSIS — E46 Unspecified protein-calorie malnutrition: Secondary | ICD-10-CM | POA: Diagnosis not present

## 2022-07-06 DIAGNOSIS — Z7982 Long term (current) use of aspirin: Secondary | ICD-10-CM | POA: Diagnosis not present

## 2022-07-06 DIAGNOSIS — E785 Hyperlipidemia, unspecified: Secondary | ICD-10-CM | POA: Diagnosis not present

## 2022-07-06 DIAGNOSIS — N185 Chronic kidney disease, stage 5: Secondary | ICD-10-CM | POA: Diagnosis not present

## 2022-07-06 DIAGNOSIS — N189 Chronic kidney disease, unspecified: Secondary | ICD-10-CM | POA: Diagnosis not present

## 2022-07-06 DIAGNOSIS — K922 Gastrointestinal hemorrhage, unspecified: Secondary | ICD-10-CM | POA: Diagnosis not present

## 2022-07-06 DIAGNOSIS — R400 Somnolence: Secondary | ICD-10-CM | POA: Diagnosis not present

## 2022-07-06 DIAGNOSIS — I132 Hypertensive heart and chronic kidney disease with heart failure and with stage 5 chronic kidney disease, or end stage renal disease: Secondary | ICD-10-CM | POA: Diagnosis not present

## 2022-07-06 DIAGNOSIS — Z940 Kidney transplant status: Secondary | ICD-10-CM | POA: Diagnosis not present

## 2022-07-06 DIAGNOSIS — R601 Generalized edema: Secondary | ICD-10-CM | POA: Diagnosis not present

## 2022-07-06 DIAGNOSIS — I2510 Atherosclerotic heart disease of native coronary artery without angina pectoris: Secondary | ICD-10-CM | POA: Diagnosis not present

## 2022-07-06 DIAGNOSIS — G9389 Other specified disorders of brain: Secondary | ICD-10-CM | POA: Diagnosis not present

## 2022-07-06 DIAGNOSIS — K219 Gastro-esophageal reflux disease without esophagitis: Secondary | ICD-10-CM | POA: Diagnosis not present

## 2022-07-06 DIAGNOSIS — N186 End stage renal disease: Secondary | ICD-10-CM | POA: Diagnosis not present

## 2022-07-06 DIAGNOSIS — R58 Hemorrhage, not elsewhere classified: Secondary | ICD-10-CM | POA: Diagnosis not present

## 2022-07-06 DIAGNOSIS — Z95818 Presence of other cardiac implants and grafts: Secondary | ICD-10-CM | POA: Diagnosis not present

## 2022-07-06 DIAGNOSIS — Z515 Encounter for palliative care: Secondary | ICD-10-CM | POA: Diagnosis not present

## 2022-07-06 DIAGNOSIS — J45909 Unspecified asthma, uncomplicated: Secondary | ICD-10-CM | POA: Diagnosis not present

## 2022-07-06 DIAGNOSIS — I482 Chronic atrial fibrillation, unspecified: Secondary | ICD-10-CM | POA: Diagnosis not present

## 2022-07-06 DIAGNOSIS — I493 Ventricular premature depolarization: Secondary | ICD-10-CM | POA: Diagnosis not present

## 2022-07-06 DIAGNOSIS — Z20822 Contact with and (suspected) exposure to covid-19: Secondary | ICD-10-CM | POA: Diagnosis not present

## 2022-07-06 DIAGNOSIS — F329 Major depressive disorder, single episode, unspecified: Secondary | ICD-10-CM | POA: Diagnosis not present

## 2022-07-06 DIAGNOSIS — I071 Rheumatic tricuspid insufficiency: Secondary | ICD-10-CM | POA: Diagnosis not present

## 2022-07-06 DIAGNOSIS — R410 Disorientation, unspecified: Secondary | ICD-10-CM | POA: Diagnosis not present

## 2022-07-06 DIAGNOSIS — R7989 Other specified abnormal findings of blood chemistry: Secondary | ICD-10-CM | POA: Diagnosis not present

## 2022-07-06 DIAGNOSIS — T8619 Other complication of kidney transplant: Secondary | ICD-10-CM | POA: Diagnosis not present

## 2022-07-11 ENCOUNTER — Inpatient Hospital Stay (HOSPITAL_COMMUNITY)
Admission: RE | Admit: 2022-07-11 | Discharge: 2022-07-11 | Disposition: A | Discharge status: Home / Self Care | Payer: Medicare Other | Source: Ambulatory Visit | Attending: Nephrology | Admitting: Nephrology

## 2022-07-11 ENCOUNTER — Encounter (HOSPITAL_COMMUNITY): Payer: Self-pay

## 2022-07-18 DIAGNOSIS — I493 Ventricular premature depolarization: Secondary | ICD-10-CM | POA: Diagnosis not present

## 2022-07-18 DIAGNOSIS — J810 Acute pulmonary edema: Secondary | ICD-10-CM | POA: Diagnosis not present

## 2022-07-18 DIAGNOSIS — I2510 Atherosclerotic heart disease of native coronary artery without angina pectoris: Secondary | ICD-10-CM | POA: Diagnosis not present

## 2022-07-18 DIAGNOSIS — T8619 Other complication of kidney transplant: Secondary | ICD-10-CM | POA: Diagnosis not present

## 2022-07-18 DIAGNOSIS — F329 Major depressive disorder, single episode, unspecified: Secondary | ICD-10-CM | POA: Diagnosis not present

## 2022-07-18 DIAGNOSIS — E869 Volume depletion, unspecified: Secondary | ICD-10-CM | POA: Diagnosis not present

## 2022-07-18 DIAGNOSIS — E162 Hypoglycemia, unspecified: Secondary | ICD-10-CM | POA: Diagnosis not present

## 2022-07-18 DIAGNOSIS — R531 Weakness: Secondary | ICD-10-CM | POA: Diagnosis not present

## 2022-07-18 DIAGNOSIS — N186 End stage renal disease: Secondary | ICD-10-CM | POA: Diagnosis not present

## 2022-07-18 DIAGNOSIS — N179 Acute kidney failure, unspecified: Secondary | ICD-10-CM | POA: Diagnosis not present

## 2022-07-18 DIAGNOSIS — R4182 Altered mental status, unspecified: Secondary | ICD-10-CM | POA: Diagnosis not present

## 2022-07-18 DIAGNOSIS — I132 Hypertensive heart and chronic kidney disease with heart failure and with stage 5 chronic kidney disease, or end stage renal disease: Secondary | ICD-10-CM | POA: Diagnosis not present

## 2022-07-18 DIAGNOSIS — J9612 Chronic respiratory failure with hypercapnia: Secondary | ICD-10-CM | POA: Diagnosis not present

## 2022-07-18 DIAGNOSIS — Z940 Kidney transplant status: Secondary | ICD-10-CM | POA: Diagnosis not present

## 2022-07-18 DIAGNOSIS — K219 Gastro-esophageal reflux disease without esophagitis: Secondary | ICD-10-CM | POA: Diagnosis not present

## 2022-07-18 DIAGNOSIS — R64 Cachexia: Secondary | ICD-10-CM | POA: Diagnosis not present

## 2022-07-18 DIAGNOSIS — Z7969 Long term (current) use of other immunomodulators and immunosuppressants: Secondary | ICD-10-CM | POA: Diagnosis not present

## 2022-07-18 DIAGNOSIS — R55 Syncope and collapse: Secondary | ICD-10-CM | POA: Diagnosis not present

## 2022-07-18 DIAGNOSIS — T07XXXA Unspecified multiple injuries, initial encounter: Secondary | ICD-10-CM | POA: Diagnosis not present

## 2022-07-18 DIAGNOSIS — Z7982 Long term (current) use of aspirin: Secondary | ICD-10-CM | POA: Diagnosis not present

## 2022-07-18 DIAGNOSIS — I639 Cerebral infarction, unspecified: Secondary | ICD-10-CM | POA: Diagnosis not present

## 2022-07-18 DIAGNOSIS — I5020 Unspecified systolic (congestive) heart failure: Secondary | ICD-10-CM | POA: Diagnosis not present

## 2022-07-18 DIAGNOSIS — N189 Chronic kidney disease, unspecified: Secondary | ICD-10-CM | POA: Diagnosis not present

## 2022-07-18 DIAGNOSIS — N390 Urinary tract infection, site not specified: Secondary | ICD-10-CM | POA: Diagnosis not present

## 2022-07-18 DIAGNOSIS — R638 Other symptoms and signs concerning food and fluid intake: Secondary | ICD-10-CM | POA: Diagnosis not present

## 2022-07-18 DIAGNOSIS — Z950 Presence of cardiac pacemaker: Secondary | ICD-10-CM | POA: Diagnosis not present

## 2022-07-18 DIAGNOSIS — I4891 Unspecified atrial fibrillation: Secondary | ICD-10-CM | POA: Diagnosis not present

## 2022-07-18 DIAGNOSIS — R52 Pain, unspecified: Secondary | ICD-10-CM | POA: Diagnosis not present

## 2022-07-18 DIAGNOSIS — Z7189 Other specified counseling: Secondary | ICD-10-CM | POA: Diagnosis not present

## 2022-07-18 DIAGNOSIS — R509 Fever, unspecified: Secondary | ICD-10-CM | POA: Diagnosis not present

## 2022-07-18 DIAGNOSIS — I69951 Hemiplegia and hemiparesis following unspecified cerebrovascular disease affecting right dominant side: Secondary | ICD-10-CM | POA: Diagnosis not present

## 2022-07-18 DIAGNOSIS — I10 Essential (primary) hypertension: Secondary | ICD-10-CM | POA: Diagnosis not present

## 2022-07-18 DIAGNOSIS — Z79621 Long term (current) use of calcineurin inhibitor: Secondary | ICD-10-CM | POA: Diagnosis not present

## 2022-07-18 DIAGNOSIS — L89151 Pressure ulcer of sacral region, stage 1: Secondary | ICD-10-CM | POA: Diagnosis not present

## 2022-07-18 DIAGNOSIS — I071 Rheumatic tricuspid insufficiency: Secondary | ICD-10-CM | POA: Diagnosis not present

## 2022-07-18 DIAGNOSIS — R601 Generalized edema: Secondary | ICD-10-CM | POA: Diagnosis not present

## 2022-07-18 DIAGNOSIS — G9340 Encephalopathy, unspecified: Secondary | ICD-10-CM | POA: Diagnosis not present

## 2022-07-18 DIAGNOSIS — Z515 Encounter for palliative care: Secondary | ICD-10-CM | POA: Diagnosis not present

## 2022-07-18 DIAGNOSIS — Z743 Need for continuous supervision: Secondary | ICD-10-CM | POA: Diagnosis not present

## 2022-07-18 DIAGNOSIS — R0689 Other abnormalities of breathing: Secondary | ICD-10-CM | POA: Diagnosis not present

## 2022-07-18 DIAGNOSIS — N3000 Acute cystitis without hematuria: Secondary | ICD-10-CM | POA: Diagnosis not present

## 2022-07-18 DIAGNOSIS — D84821 Immunodeficiency due to drugs: Secondary | ICD-10-CM | POA: Diagnosis not present

## 2022-07-18 DIAGNOSIS — E43 Unspecified severe protein-calorie malnutrition: Secondary | ICD-10-CM | POA: Diagnosis not present

## 2022-07-18 DIAGNOSIS — I4811 Longstanding persistent atrial fibrillation: Secondary | ICD-10-CM | POA: Diagnosis not present

## 2022-07-18 DIAGNOSIS — G9349 Other encephalopathy: Secondary | ICD-10-CM | POA: Diagnosis not present

## 2022-07-18 DIAGNOSIS — I5023 Acute on chronic systolic (congestive) heart failure: Secondary | ICD-10-CM | POA: Diagnosis not present

## 2022-07-18 DIAGNOSIS — Z20822 Contact with and (suspected) exposure to covid-19: Secondary | ICD-10-CM | POA: Diagnosis not present

## 2022-07-18 DIAGNOSIS — Z95818 Presence of other cardiac implants and grafts: Secondary | ICD-10-CM | POA: Diagnosis not present

## 2022-07-18 DIAGNOSIS — Z8673 Personal history of transient ischemic attack (TIA), and cerebral infarction without residual deficits: Secondary | ICD-10-CM | POA: Diagnosis not present

## 2022-07-18 DIAGNOSIS — Z7901 Long term (current) use of anticoagulants: Secondary | ICD-10-CM | POA: Diagnosis not present

## 2022-07-18 DIAGNOSIS — J90 Pleural effusion, not elsewhere classified: Secondary | ICD-10-CM | POA: Diagnosis not present

## 2022-07-18 DIAGNOSIS — E46 Unspecified protein-calorie malnutrition: Secondary | ICD-10-CM | POA: Diagnosis not present

## 2022-07-18 DIAGNOSIS — R627 Adult failure to thrive: Secondary | ICD-10-CM | POA: Diagnosis not present

## 2022-07-18 DIAGNOSIS — I272 Pulmonary hypertension, unspecified: Secondary | ICD-10-CM | POA: Diagnosis not present

## 2022-07-18 DIAGNOSIS — G9341 Metabolic encephalopathy: Secondary | ICD-10-CM | POA: Diagnosis not present

## 2022-07-18 DIAGNOSIS — J690 Pneumonitis due to inhalation of food and vomit: Secondary | ICD-10-CM | POA: Diagnosis not present

## 2022-07-18 DIAGNOSIS — R410 Disorientation, unspecified: Secondary | ICD-10-CM | POA: Diagnosis not present

## 2022-07-18 DIAGNOSIS — Z86718 Personal history of other venous thrombosis and embolism: Secondary | ICD-10-CM | POA: Diagnosis not present

## 2022-07-18 DIAGNOSIS — I130 Hypertensive heart and chronic kidney disease with heart failure and stage 1 through stage 4 chronic kidney disease, or unspecified chronic kidney disease: Secondary | ICD-10-CM | POA: Diagnosis not present

## 2022-07-18 DIAGNOSIS — Z7952 Long term (current) use of systemic steroids: Secondary | ICD-10-CM | POA: Diagnosis not present

## 2022-07-18 DIAGNOSIS — R609 Edema, unspecified: Secondary | ICD-10-CM | POA: Diagnosis not present

## 2022-07-18 DIAGNOSIS — R400 Somnolence: Secondary | ICD-10-CM | POA: Diagnosis not present

## 2022-07-18 DIAGNOSIS — R7989 Other specified abnormal findings of blood chemistry: Secondary | ICD-10-CM | POA: Diagnosis not present

## 2022-07-18 DIAGNOSIS — R8281 Pyuria: Secondary | ICD-10-CM | POA: Diagnosis not present

## 2022-07-18 DIAGNOSIS — J45909 Unspecified asthma, uncomplicated: Secondary | ICD-10-CM | POA: Diagnosis not present

## 2022-07-18 DIAGNOSIS — Z66 Do not resuscitate: Secondary | ICD-10-CM | POA: Diagnosis not present

## 2022-07-18 DIAGNOSIS — G9389 Other specified disorders of brain: Secondary | ICD-10-CM | POA: Diagnosis not present

## 2022-07-18 DIAGNOSIS — E785 Hyperlipidemia, unspecified: Secondary | ICD-10-CM | POA: Diagnosis not present

## 2022-07-24 ENCOUNTER — Telehealth: Payer: Self-pay | Admitting: Nurse Practitioner

## 2022-07-24 NOTE — Telephone Encounter (Signed)
Kyra from hospice would like charlotte to serve as attending a record for hospice . [facility] Sewer # is [phone]

## 2022-07-25 DIAGNOSIS — Z95810 Presence of automatic (implantable) cardiac defibrillator: Secondary | ICD-10-CM | POA: Diagnosis not present

## 2022-07-25 DIAGNOSIS — I4891 Unspecified atrial fibrillation: Secondary | ICD-10-CM | POA: Diagnosis not present

## 2022-07-25 DIAGNOSIS — I509 Heart failure, unspecified: Secondary | ICD-10-CM | POA: Diagnosis not present

## 2022-07-25 DIAGNOSIS — I69351 Hemiplegia and hemiparesis following cerebral infarction affecting right dominant side: Secondary | ICD-10-CM | POA: Diagnosis not present

## 2022-07-25 DIAGNOSIS — E43 Unspecified severe protein-calorie malnutrition: Secondary | ICD-10-CM | POA: Diagnosis not present

## 2022-07-25 DIAGNOSIS — I69318 Other symptoms and signs involving cognitive functions following cerebral infarction: Secondary | ICD-10-CM | POA: Diagnosis not present

## 2022-07-25 DIAGNOSIS — I4819 Other persistent atrial fibrillation: Secondary | ICD-10-CM | POA: Diagnosis not present

## 2022-07-25 DIAGNOSIS — N189 Chronic kidney disease, unspecified: Secondary | ICD-10-CM | POA: Diagnosis not present

## 2022-07-25 DIAGNOSIS — I130 Hypertensive heart and chronic kidney disease with heart failure and stage 1 through stage 4 chronic kidney disease, or unspecified chronic kidney disease: Secondary | ICD-10-CM | POA: Diagnosis not present

## 2022-07-25 DIAGNOSIS — Z45018 Encounter for adjustment and management of other part of cardiac pacemaker: Secondary | ICD-10-CM | POA: Diagnosis not present

## 2022-07-26 ENCOUNTER — Telehealth: Payer: Self-pay | Admitting: Nurse Practitioner

## 2022-07-26 DIAGNOSIS — I130 Hypertensive heart and chronic kidney disease with heart failure and stage 1 through stage 4 chronic kidney disease, or unspecified chronic kidney disease: Secondary | ICD-10-CM | POA: Diagnosis not present

## 2022-07-26 DIAGNOSIS — I4891 Unspecified atrial fibrillation: Secondary | ICD-10-CM | POA: Diagnosis not present

## 2022-07-26 DIAGNOSIS — I509 Heart failure, unspecified: Secondary | ICD-10-CM | POA: Diagnosis not present

## 2022-07-26 DIAGNOSIS — I69318 Other symptoms and signs involving cognitive functions following cerebral infarction: Secondary | ICD-10-CM | POA: Diagnosis not present

## 2022-07-26 DIAGNOSIS — N189 Chronic kidney disease, unspecified: Secondary | ICD-10-CM | POA: Diagnosis not present

## 2022-07-26 DIAGNOSIS — I69351 Hemiplegia and hemiparesis following cerebral infarction affecting right dominant side: Secondary | ICD-10-CM | POA: Diagnosis not present

## 2022-07-27 NOTE — Telephone Encounter (Signed)
Contacted [name] to extend my condolences. Daughter [name] was present during call

## 2022-08-08 ENCOUNTER — Inpatient Hospital Stay (HOSPITAL_COMMUNITY): Admission: RE | Admit: 2022-08-08 | Payer: Medicare Other | Source: Ambulatory Visit

## 2022-08-10 ENCOUNTER — Telehealth: Payer: Self-pay | Admitting: Nurse Practitioner

## 2022-08-10 NOTE — Telephone Encounter (Signed)
[place] [phone] is calling about a death certificate. It was posted to the [facility] system on the 16th.

## 2022-08-16 NOTE — Telephone Encounter (Signed)
Hollie from [facility] called and stated that the pt has passed this morning 08-07-2022 @ 7:13 am (Pt is  DECEASED )

## 2022-08-16 NOTE — Telephone Encounter (Signed)
Mr jelinek called to inform you that mrs luca is deceased

## 2022-08-16 NOTE — Telephone Encounter (Signed)
Noted  

## 2022-08-16 DEATH — deceased
